# Patient Record
Sex: Male | Born: 1965
Health system: Southern US, Community
[De-identification: ages and names within clinical notes are randomized; demographics above are authoritative.]

## PROBLEM LIST (undated history)

## (undated) DIAGNOSIS — I2699 Other pulmonary embolism without acute cor pulmonale: Secondary | ICD-10-CM

## (undated) DIAGNOSIS — E119 Type 2 diabetes mellitus without complications: Secondary | ICD-10-CM

## (undated) DIAGNOSIS — I10 Essential (primary) hypertension: Secondary | ICD-10-CM

## (undated) DIAGNOSIS — M109 Gout, unspecified: Secondary | ICD-10-CM

## (undated) DIAGNOSIS — I480 Paroxysmal atrial fibrillation: Secondary | ICD-10-CM

## (undated) DIAGNOSIS — C9000 Multiple myeloma not having achieved remission: Secondary | ICD-10-CM

## (undated) DIAGNOSIS — D649 Anemia, unspecified: Secondary | ICD-10-CM

## (undated) DIAGNOSIS — E875 Hyperkalemia: Secondary | ICD-10-CM

## (undated) DIAGNOSIS — E1165 Type 2 diabetes mellitus with hyperglycemia: Secondary | ICD-10-CM

## (undated) DIAGNOSIS — I639 Cerebral infarction, unspecified: Secondary | ICD-10-CM

## (undated) DIAGNOSIS — R0989 Other specified symptoms and signs involving the circulatory and respiratory systems: Secondary | ICD-10-CM

## (undated) DIAGNOSIS — S065X9A Traumatic subdural hemorrhage with loss of consciousness of unspecified duration, initial encounter: Secondary | ICD-10-CM

## (undated) DIAGNOSIS — J45909 Unspecified asthma, uncomplicated: Secondary | ICD-10-CM

## (undated) DIAGNOSIS — H35039 Hypertensive retinopathy, unspecified eye: Secondary | ICD-10-CM

## (undated) DIAGNOSIS — G40909 Epilepsy, unspecified, not intractable, without status epilepticus: Secondary | ICD-10-CM

## (undated) DIAGNOSIS — K922 Gastrointestinal hemorrhage, unspecified: Secondary | ICD-10-CM

## (undated) DIAGNOSIS — K559 Vascular disorder of intestine, unspecified: Secondary | ICD-10-CM

## (undated) DIAGNOSIS — R7881 Bacteremia: Secondary | ICD-10-CM

## (undated) DIAGNOSIS — E11319 Type 2 diabetes mellitus with unspecified diabetic retinopathy without macular edema: Secondary | ICD-10-CM

## (undated) DIAGNOSIS — A419 Sepsis, unspecified organism: Secondary | ICD-10-CM

## (undated) DIAGNOSIS — R569 Unspecified convulsions: Secondary | ICD-10-CM

## (undated) DIAGNOSIS — D5740 Sickle-cell thalassemia without crisis: Secondary | ICD-10-CM

## (undated) DIAGNOSIS — G4733 Obstructive sleep apnea (adult) (pediatric): Secondary | ICD-10-CM

## (undated) DIAGNOSIS — D472 Monoclonal gammopathy: Secondary | ICD-10-CM

## (undated) DIAGNOSIS — N179 Acute kidney failure, unspecified: Secondary | ICD-10-CM

## (undated) DIAGNOSIS — Z9289 Personal history of other medical treatment: Secondary | ICD-10-CM

## (undated) DIAGNOSIS — Z992 Dependence on renal dialysis: Secondary | ICD-10-CM

## (undated) DIAGNOSIS — G4730 Sleep apnea, unspecified: Secondary | ICD-10-CM

## (undated) DIAGNOSIS — R7309 Other abnormal glucose: Secondary | ICD-10-CM

## (undated) DIAGNOSIS — Z9989 Dependence on other enabling machines and devices: Secondary | ICD-10-CM

## (undated) DIAGNOSIS — I4891 Unspecified atrial fibrillation: Secondary | ICD-10-CM

## (undated) DIAGNOSIS — N186 End stage renal disease: Secondary | ICD-10-CM

## (undated) HISTORY — PX: OTHER SURGICAL HISTORY: SHX169

## (undated) HISTORY — DX: Type 2 diabetes mellitus with unspecified diabetic retinopathy without macular edema: E11.319

## (undated) HISTORY — DX: Cerebral infarction, unspecified: I63.9

## (undated) HISTORY — DX: Sleep apnea, unspecified: G47.30

## (undated) HISTORY — DX: Unspecified convulsions: R56.9

## (undated) HISTORY — DX: Paroxysmal atrial fibrillation: I48.0

## (undated) HISTORY — DX: Acute kidney failure, unspecified: N17.9

## (undated) HISTORY — PX: CHOLECYSTECTOMY: SHX55

## (undated) HISTORY — PX: EYE SURGERY: SHX253

## (undated) HISTORY — DX: Other specified symptoms and signs involving the circulatory and respiratory systems: R09.89

## (undated) HISTORY — DX: Vascular disorder of intestine, unspecified: K55.9

## (undated) HISTORY — PX: BONE MARROW BIOPSY: SHX199

## (undated) HISTORY — DX: Type 2 diabetes mellitus with hyperglycemia: E11.65

## (undated) HISTORY — DX: Hypertensive retinopathy, unspecified eye: H35.039

## (undated) HISTORY — DX: Type 2 diabetes mellitus without complications: E11.9

## (undated) HISTORY — DX: Other abnormal glucose: R73.09

## (undated) HISTORY — DX: Bacteremia: R78.81

## (undated) HISTORY — DX: Monoclonal gammopathy: D47.2

## (undated) HISTORY — DX: Hyperkalemia: E87.5

---
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
## 2009-10-25 ENCOUNTER — Encounter: Admission: RE | Admit: 2009-10-25 | Discharge: 2009-10-25 | Payer: Self-pay | Admitting: Family Medicine

## 2010-02-11 ENCOUNTER — Encounter: Admission: RE | Admit: 2010-02-11 | Discharge: 2010-02-26 | Payer: Self-pay | Admitting: Family Medicine

## 2010-09-29 ENCOUNTER — Encounter: Payer: Self-pay | Admitting: Family Medicine

## 2010-09-30 ENCOUNTER — Encounter: Payer: Self-pay | Admitting: Cardiovascular Disease

## 2010-11-29 ENCOUNTER — Other Ambulatory Visit: Payer: Self-pay | Admitting: Oncology

## 2010-11-29 ENCOUNTER — Ambulatory Visit (HOSPITAL_COMMUNITY)
Admission: RE | Admit: 2010-11-29 | Discharge: 2010-11-29 | Disposition: A | Discharge status: Home / Self Care | Payer: 59 | Source: Ambulatory Visit | Attending: Oncology | Admitting: Oncology

## 2010-11-29 ENCOUNTER — Encounter (HOSPITAL_BASED_OUTPATIENT_CLINIC_OR_DEPARTMENT_OTHER): Payer: Commercial Managed Care - PPO | Admitting: Oncology

## 2010-11-29 DIAGNOSIS — C9000 Multiple myeloma not having achieved remission: Secondary | ICD-10-CM | POA: Insufficient documentation

## 2010-11-29 DIAGNOSIS — D472 Monoclonal gammopathy: Secondary | ICD-10-CM

## 2010-11-29 LAB — CBC WITH DIFFERENTIAL/PLATELET
BASO%: 0.6 % (ref 0.0–2.0)
Basophils Absolute: 0.1 10*3/uL (ref 0.0–0.1)
EOS%: 3.4 % (ref 0.0–7.0)
Eosinophils Absolute: 0.6 10*3/uL — ABNORMAL HIGH (ref 0.0–0.5)
HCT: 31.8 % — ABNORMAL LOW (ref 38.4–49.9)
HGB: 11.2 g/dL — ABNORMAL LOW (ref 13.0–17.1)
LYMPH%: 14.2 % (ref 14.0–49.0)
MCH: 25.5 pg — ABNORMAL LOW (ref 27.2–33.4)
MCHC: 35.2 g/dL (ref 32.0–36.0)
MCV: 72.3 fL — ABNORMAL LOW (ref 79.3–98.0)
MONO#: 1.8 10*3/uL — ABNORMAL HIGH (ref 0.1–0.9)
MONO%: 10.2 % (ref 0.0–14.0)
NEUT#: 12.6 10*3/uL — ABNORMAL HIGH (ref 1.5–6.5)
NEUT%: 71.6 % (ref 39.0–75.0)
Platelets: 383 10*3/uL (ref 140–400)
RBC: 4.4 10*6/uL (ref 4.20–5.82)
RDW: 17.8 % — ABNORMAL HIGH (ref 11.0–14.6)
WBC: 17.5 10*3/uL — ABNORMAL HIGH (ref 4.0–10.3)
lymph#: 2.5 10*3/uL (ref 0.9–3.3)
nRBC: 2 % — ABNORMAL HIGH (ref 0–0)

## 2010-11-29 LAB — CHCC SMEAR

## 2010-12-03 LAB — SPEP & IFE WITH QIG
Albumin ELP: 49.4 % — ABNORMAL LOW (ref 55.8–66.1)
Alpha-1-Globulin: 4.9 % (ref 2.9–4.9)
Alpha-2-Globulin: 8.4 % (ref 7.1–11.8)
Beta 2: 4.7 % (ref 3.2–6.5)
Beta Globulin: 5.4 % (ref 4.7–7.2)
Gamma Globulin: 27.2 % — ABNORMAL HIGH (ref 11.1–18.8)
IgA: 156 mg/dL (ref 68–378)
IgG (Immunoglobin G), Serum: 2780 mg/dL — ABNORMAL HIGH (ref 694–1618)
IgM, Serum: 51 mg/dL — ABNORMAL LOW (ref 60–263)
M-Spike, %: 1.15 g/dL
Total Protein, Serum Electrophoresis: 8.6 g/dL — ABNORMAL HIGH (ref 6.0–8.3)

## 2010-12-03 LAB — BETA 2 MICROGLOBULIN, SERUM: Beta-2 Microglobulin: 4.09 mg/L — ABNORMAL HIGH (ref 1.01–1.73)

## 2010-12-03 LAB — KAPPA/LAMBDA LIGHT CHAINS
Kappa free light chain: 4.25 mg/dL — ABNORMAL HIGH (ref 0.33–1.94)
Kappa:Lambda Ratio: 1.52 (ref 0.26–1.65)
Lambda Free Lght Chn: 2.8 mg/dL — ABNORMAL HIGH (ref 0.57–2.63)

## 2010-12-03 LAB — COMPREHENSIVE METABOLIC PANEL
ALT: 12 U/L (ref 0–53)
AST: 12 U/L (ref 0–37)
Albumin: 4.3 g/dL (ref 3.5–5.2)
Alkaline Phosphatase: 133 U/L — ABNORMAL HIGH (ref 39–117)
BUN: 28 mg/dL — ABNORMAL HIGH (ref 6–23)
CO2: 18 mEq/L — ABNORMAL LOW (ref 19–32)
Calcium: 9.4 mg/dL (ref 8.4–10.5)
Chloride: 111 mEq/L (ref 96–112)
Creatinine, Ser: 1.76 mg/dL — ABNORMAL HIGH (ref 0.40–1.50)
Glucose, Bld: 212 mg/dL — ABNORMAL HIGH (ref 70–99)
Potassium: 5.1 mEq/L (ref 3.5–5.3)
Sodium: 139 mEq/L (ref 135–145)
Total Bilirubin: 1.1 mg/dL (ref 0.3–1.2)
Total Protein: 8.6 g/dL — ABNORMAL HIGH (ref 6.0–8.3)

## 2010-12-06 ENCOUNTER — Other Ambulatory Visit: Payer: Self-pay | Admitting: Oncology

## 2010-12-10 LAB — UIFE/LIGHT CHAINS/TP QN, 24-HR UR
Albumin, U: DETECTED
Alpha 1, Urine: DETECTED — AB
Alpha 2, Urine: DETECTED — AB
Beta, Urine: DETECTED — AB
Free Kappa Lt Chains,Ur: 16.2 mg/dL — ABNORMAL HIGH (ref 0.04–1.51)
Free Kappa/Lambda Ratio: 11.25 ratio — ABNORMAL HIGH (ref 0.46–4.00)
Free Lambda Excretion/Day: 38.88 mg/d
Free Lambda Lt Chains,Ur: 1.44 mg/dL — ABNORMAL HIGH (ref 0.08–1.01)
Free Lt Chn Excr Rate: 437.4 mg/d
Gamma Globulin, Urine: DETECTED — AB
Time: 24 hours
Total Protein, Urine-Ur/day: 1018 mg/d — ABNORMAL HIGH (ref 10–140)
Total Protein, Urine: 37.7 mg/dL
Volume, Urine: 2700 mL

## 2010-12-13 DIAGNOSIS — D631 Anemia in chronic kidney disease: Secondary | ICD-10-CM

## 2010-12-13 DIAGNOSIS — N039 Chronic nephritic syndrome with unspecified morphologic changes: Secondary | ICD-10-CM

## 2010-12-13 DIAGNOSIS — I120 Hypertensive chronic kidney disease with stage 5 chronic kidney disease or end stage renal disease: Secondary | ICD-10-CM

## 2010-12-13 DIAGNOSIS — N189 Chronic kidney disease, unspecified: Secondary | ICD-10-CM

## 2010-12-16 ENCOUNTER — Other Ambulatory Visit: Payer: Self-pay | Admitting: Oncology

## 2010-12-16 DIAGNOSIS — C9000 Multiple myeloma not having achieved remission: Secondary | ICD-10-CM

## 2010-12-27 ENCOUNTER — Ambulatory Visit (HOSPITAL_COMMUNITY): Payer: Commercial Managed Care - PPO

## 2010-12-27 ENCOUNTER — Other Ambulatory Visit: Payer: Self-pay | Admitting: Oncology

## 2010-12-27 ENCOUNTER — Other Ambulatory Visit: Payer: Self-pay | Admitting: Diagnostic Radiology

## 2010-12-27 ENCOUNTER — Ambulatory Visit (HOSPITAL_COMMUNITY)
Admission: RE | Admit: 2010-12-27 | Discharge: 2010-12-27 | Disposition: A | Discharge status: Home / Self Care | Payer: Commercial Managed Care - PPO | Source: Ambulatory Visit | Attending: Oncology | Admitting: Oncology

## 2010-12-27 DIAGNOSIS — C9000 Multiple myeloma not having achieved remission: Secondary | ICD-10-CM | POA: Insufficient documentation

## 2010-12-27 LAB — CBC
HCT: 32 % — ABNORMAL LOW (ref 39.0–52.0)
Hemoglobin: 11.3 g/dL — ABNORMAL LOW (ref 13.0–17.0)
MCH: 25.6 pg — ABNORMAL LOW (ref 26.0–34.0)
MCHC: 35.3 g/dL (ref 30.0–36.0)
MCV: 72.4 fL — ABNORMAL LOW (ref 78.0–100.0)
Platelets: 395 K/uL (ref 150–400)
RBC: 4.42 MIL/uL (ref 4.22–5.81)
RDW: 17.5 % — ABNORMAL HIGH (ref 11.5–15.5)
WBC: 16.2 K/uL — ABNORMAL HIGH (ref 4.0–10.5)

## 2010-12-27 LAB — GLUCOSE, CAPILLARY
Glucose-Capillary: 160 mg/dL — ABNORMAL HIGH (ref 70–99)
Glucose-Capillary: 159 mg/dL — ABNORMAL HIGH (ref 70–99)

## 2010-12-27 LAB — PROTIME-INR
INR: 1.06 (ref 0.00–1.49)
Prothrombin Time: 14 seconds (ref 11.6–15.2)

## 2010-12-27 LAB — APTT: aPTT: 34 s (ref 24–37)

## 2011-07-04 ENCOUNTER — Other Ambulatory Visit: Payer: Self-pay | Admitting: Oncology

## 2011-07-04 ENCOUNTER — Encounter (HOSPITAL_BASED_OUTPATIENT_CLINIC_OR_DEPARTMENT_OTHER): Payer: 59 | Admitting: Oncology

## 2011-07-04 DIAGNOSIS — D472 Monoclonal gammopathy: Secondary | ICD-10-CM

## 2011-07-04 DIAGNOSIS — I120 Hypertensive chronic kidney disease with stage 5 chronic kidney disease or end stage renal disease: Secondary | ICD-10-CM

## 2011-07-04 DIAGNOSIS — N189 Chronic kidney disease, unspecified: Secondary | ICD-10-CM

## 2011-07-04 DIAGNOSIS — D631 Anemia in chronic kidney disease: Secondary | ICD-10-CM

## 2011-07-04 LAB — CBC WITH DIFFERENTIAL/PLATELET
BASO%: 1 % (ref 0.0–2.0)
Basophils Absolute: 0.1 10*3/uL (ref 0.0–0.1)
EOS%: 3.4 % (ref 0.0–7.0)
Eosinophils Absolute: 0.5 10*3/uL (ref 0.0–0.5)
HCT: 30.9 % — ABNORMAL LOW (ref 38.4–49.9)
HGB: 10.9 g/dL — ABNORMAL LOW (ref 13.0–17.1)
LYMPH%: 20 % (ref 14.0–49.0)
MCH: 25.5 pg — ABNORMAL LOW (ref 27.2–33.4)
MCHC: 35.3 g/dL (ref 32.0–36.0)
MCV: 72.2 fL — ABNORMAL LOW (ref 79.3–98.0)
MONO#: 1.6 10*3/uL — ABNORMAL HIGH (ref 0.1–0.9)
MONO%: 10.6 % (ref 0.0–14.0)
NEUT#: 9.6 10*3/uL — ABNORMAL HIGH (ref 1.5–6.5)
NEUT%: 65 % (ref 39.0–75.0)
Platelets: 374 10*3/uL (ref 140–400)
RBC: 4.28 10*6/uL (ref 4.20–5.82)
RDW: 17.5 % — ABNORMAL HIGH (ref 11.0–14.6)
WBC: 14.7 10*3/uL — ABNORMAL HIGH (ref 4.0–10.3)
lymph#: 2.9 10*3/uL (ref 0.9–3.3)
nRBC: 3 % — ABNORMAL HIGH (ref 0–0)

## 2011-07-10 DIAGNOSIS — I2699 Other pulmonary embolism without acute cor pulmonale: Secondary | ICD-10-CM

## 2011-07-10 DIAGNOSIS — E875 Hyperkalemia: Secondary | ICD-10-CM

## 2011-07-10 HISTORY — DX: Hyperkalemia: E87.5

## 2011-07-10 HISTORY — DX: Other pulmonary embolism without acute cor pulmonale: I26.99

## 2011-07-11 ENCOUNTER — Telehealth: Payer: Self-pay | Admitting: Oncology

## 2011-07-11 ENCOUNTER — Ambulatory Visit (HOSPITAL_BASED_OUTPATIENT_CLINIC_OR_DEPARTMENT_OTHER): Payer: 59 | Admitting: Oncology

## 2011-07-11 VITALS — BP 136/87 | HR 81 | Temp 98.6°F | Ht 69.0 in | Wt 293.2 lb

## 2011-07-11 DIAGNOSIS — D7289 Other specified disorders of white blood cells: Secondary | ICD-10-CM

## 2011-07-11 DIAGNOSIS — D631 Anemia in chronic kidney disease: Secondary | ICD-10-CM

## 2011-07-11 DIAGNOSIS — D729 Disorder of white blood cells, unspecified: Secondary | ICD-10-CM

## 2011-07-11 DIAGNOSIS — D472 Monoclonal gammopathy: Secondary | ICD-10-CM

## 2011-07-11 DIAGNOSIS — I120 Hypertensive chronic kidney disease with stage 5 chronic kidney disease or end stage renal disease: Secondary | ICD-10-CM

## 2011-07-11 DIAGNOSIS — N189 Chronic kidney disease, unspecified: Secondary | ICD-10-CM

## 2011-07-11 DIAGNOSIS — N185 Chronic kidney disease, stage 5: Secondary | ICD-10-CM

## 2011-07-11 LAB — HGB ELECTROPHORESIS REFLEXED REPORT
Hemoglobin A - HGBRFX: 0 % — ABNORMAL LOW (ref >96.0)
Hemoglobin A2 - HGBRFX: 3.9 % — ABNORMAL HIGH (ref 1.8–3.5)
Hemoglobin Elect C: 43.8 % — ABNORMAL HIGH
Hemoglobin F - HGBRFX: 1.1 % (ref <2.0)
Hemoglobin S - HGBRFX: 51.2 % — ABNORMAL HIGH
Sickle Solubility Test - HGBRFX: POSITIVE — AB

## 2011-07-11 LAB — COMPREHENSIVE METABOLIC PANEL
ALT: 11 U/L (ref 0–53)
AST: 13 U/L (ref 0–37)
Albumin: 4.2 g/dL (ref 3.5–5.2)
Alkaline Phosphatase: 115 U/L (ref 39–117)
BUN: 34 mg/dL — ABNORMAL HIGH (ref 6–23)
CO2: 14 mEq/L — ABNORMAL LOW (ref 19–32)
Calcium: 8.8 mg/dL (ref 8.4–10.5)
Chloride: 112 mEq/L (ref 96–112)
Creatinine, Ser: 1.92 mg/dL — ABNORMAL HIGH (ref 0.50–1.35)
Glucose, Bld: 148 mg/dL — ABNORMAL HIGH (ref 70–99)
Potassium: 5.7 mEq/L — ABNORMAL HIGH (ref 3.5–5.3)
Sodium: 137 mEq/L (ref 135–145)
Total Bilirubin: 0.9 mg/dL (ref 0.3–1.2)
Total Protein: 8.2 g/dL (ref 6.0–8.3)

## 2011-07-11 LAB — SPEP & IFE WITH QIG
Albumin ELP: 49.2 % — ABNORMAL LOW (ref 55.8–66.1)
Alpha-1-Globulin: 4.3 % (ref 2.9–4.9)
Alpha-2-Globulin: 8.1 % (ref 7.1–11.8)
Beta 2: 4.6 % (ref 3.2–6.5)
Beta Globulin: 5 % (ref 4.7–7.2)
Gamma Globulin: 28.8 % — ABNORMAL HIGH (ref 11.1–18.8)
IgA: 152 mg/dL (ref 68–379)
IgG (Immunoglobin G), Serum: 2760 mg/dL — ABNORMAL HIGH (ref 650–1600)
IgM, Serum: 44 mg/dL (ref 41–251)
M-Spike, %: 1.22 g/dL
Total Protein, Serum Electrophoresis: 8.2 g/dL (ref 6.0–8.3)

## 2011-07-11 LAB — HEMOGLOBINOPATHY EVALUATION
Hemoglobin Other: 44.3 % — ABNORMAL HIGH (ref 0.0–0.0)
Hgb A2 Quant: 3.1 % (ref 2.2–3.2)
Hgb A: 0 % — ABNORMAL LOW (ref 96.8–97.8)
Hgb F Quant: 1.7 % (ref 0.0–2.0)
Hgb S Quant: 50.9 % — ABNORMAL HIGH (ref 0.0–0.0)

## 2011-07-11 LAB — KAPPA/LAMBDA LIGHT CHAINS
Kappa free light chain: 7.09 mg/dL — ABNORMAL HIGH (ref 0.33–1.94)
Kappa:Lambda Ratio: 1.93 — ABNORMAL HIGH (ref 0.26–1.65)
Lambda Free Lght Chn: 3.67 mg/dL — ABNORMAL HIGH (ref 0.57–2.63)

## 2011-07-11 NOTE — Telephone Encounter (Signed)
gv pt appt schedule for may.

## 2011-07-14 NOTE — Progress Notes (Signed)
CC:   Louis Meckel, M.D. Lynne Logan, MD  PRINCIPAL DIAGNOSES:  This is a 45 year old gentleman with the following diagnoses: 1. Monoclonal protein in the form of IgG kappa, likely represents     monoclonal gammopathy, undetermined significance without enough     criteria to suggest multiple myeloma. 2. Microcytic anemia as a part of a hemoglobinopathy, most likely     thalassemia.  HISTORY OF PRESENT ILLNESS:  Mr. Elting presents today for a followup visit.  He is a gentleman whom I saw initially back in March 2012 for evaluation of IgG kappa monoclonal protein.  At that time, his M-spike was around 1.1 g/dL.  He had an IgG level of 2,780.  He did not have any lytic bony lesions.  He did not have severe plasmacytosis.  He had a slight increase in his plasma cells about 7%, but the pattern did not indicate a monotonous infiltration of plasma cells more than the intermittent scattered clusters, again, which suggest against multiple myeloma at least for the time being.  Clinically, Mr. Beagle is asymptomatic.  He does have renal insufficiency, which could be related to his longstanding hypertension.  It could be also related to his plasma cell disorder.  Since his recent diagnosis back in March, we have elected to put him on surveillance and repeat his serum protein electrophoresis, protein studies for which he is here to discuss them today.  Clinically, Mr. Stille is asymptomatic.  He did not report any chest pain.  He did not report any back pain.  He did not report any opportunistic infection.  He has not had any hospitalization or illnesses.  Overall, performance status activity level remains unchanged at this time.  He did not report any chest pain or difficulty breathing. As mentioned, he had no complications, fatigue, or tiredness.  REVIEW OF SYSTEMS:  He does not report any headaches, blurry vision, or double vision.  He does not report any motor or sensory  neuropathy.  He does not report any alteration in mental status.  He does not report any psychiatric issues or depression.  He does not report any fever, chills, or sweats.  He does not report any cough, hemoptysis, or hematemesis. No nausea or vomiting.  No abdominal pain.  No hematochezia or melena. No genitourinary complaints.  The rest of the review of systems is unremarkable.  PHYSICAL EXAMINATION:  General Appearance:  An alert and awake gentleman who appeared in no active distress.  Vital Signs:  His blood pressure is 136/80.  Pulse is 81.  Respirations 20.  Weight is 293.2 pounds.  HEENT: Head is normocephalic, atraumatic.  Pupils are equal, round, and reactive to light.  Oral mucosa is moist and pink.  Neck:  Supple.  No lymphadenopathy.  Heart:  Regular rate and rhythm.  S1 and S2.  Lungs: Clear to auscultation.  No wheezes or dullness to percussion.  Abdomen: Soft, nontender.  No hepatosplenomegaly.  Extremities:  No clubbing, cyanosis, or edema.  Neurologic:  Intact motor, sensory, and deep tendon reflexes.  LABORATORY DATA TODAY:  Hemoglobin of 10.9, white cell count of 14.7, platelet count 374.  Chemistry showed a creatinine of 1.92.  Normal liver function tests.  His M-spike is about 1.2 g/dL and is really unchanged.  The IgG level is 2,760, unchanged.  His kappa and lambda free light chains are both elevated with a kappa-to-lambda ratio slightly high at 1.9.  His hemoglobin electrophoresis is still pending, but is indicating a hemoglobin S  of about 50% and other hemoglobin about 44%.  Again, indicating a hemoglobinopathy.  ASSESSMENT AND PLAN:  This is a 45 year old gentleman with the following issues: 1. Monoclonal gammopathy, IgG subtype.  Again, represents likely     monoclonal gammopathy of undetermined significance.  Again, really     no clear-cut evidence to suggest end-organ damage.  No lytic bony     lesions.  His bone marrow biopsy did not suggest the  diagnosis of     multiple myeloma, again, due to the really scattered nature of his     plasma cells.  However, he is at a high risk of developing multiple     myeloma and it is very important to keep close observation.  I will     remeasure his protein studies in 6 months. 2. Anemia, which is multifactorial and definitely has an element of     hemoglobinopathy.  Again, the etiology is probably a combination of     sickle cell with thalassemia combination at this point.  Typically,     his hemoglobin is relatively under reasonable control.  It is not     severely low, so really no intervention is needed at this time.    ______________________________ Wyatt Portela, M.D. FNS/MEDQ  D:  07/11/2011  T:  07/14/2011  Job:  [id number]

## 2011-07-18 ENCOUNTER — Other Ambulatory Visit: Payer: Self-pay

## 2011-07-18 ENCOUNTER — Emergency Department (HOSPITAL_COMMUNITY): Payer: 59

## 2011-07-18 ENCOUNTER — Encounter: Payer: Self-pay | Admitting: Emergency Medicine

## 2011-07-18 ENCOUNTER — Inpatient Hospital Stay (HOSPITAL_COMMUNITY)
Admission: EM | Admit: 2011-07-18 | Discharge: 2011-07-22 | DRG: 176 | Disposition: A | Discharge status: Home / Self Care | Payer: 59 | Attending: Internal Medicine | Admitting: Internal Medicine

## 2011-07-18 DIAGNOSIS — E1129 Type 2 diabetes mellitus with other diabetic kidney complication: Secondary | ICD-10-CM | POA: Diagnosis present

## 2011-07-18 DIAGNOSIS — E872 Acidosis, unspecified: Secondary | ICD-10-CM | POA: Diagnosis present

## 2011-07-18 DIAGNOSIS — R0602 Shortness of breath: Secondary | ICD-10-CM

## 2011-07-18 DIAGNOSIS — N179 Acute kidney failure, unspecified: Secondary | ICD-10-CM | POA: Diagnosis present

## 2011-07-18 DIAGNOSIS — I10 Essential (primary) hypertension: Secondary | ICD-10-CM | POA: Diagnosis present

## 2011-07-18 DIAGNOSIS — E875 Hyperkalemia: Secondary | ICD-10-CM | POA: Diagnosis present

## 2011-07-18 DIAGNOSIS — I2699 Other pulmonary embolism without acute cor pulmonale: Principal | ICD-10-CM | POA: Diagnosis present

## 2011-07-18 DIAGNOSIS — I498 Other specified cardiac arrhythmias: Secondary | ICD-10-CM | POA: Diagnosis present

## 2011-07-18 DIAGNOSIS — D573 Sickle-cell trait: Secondary | ICD-10-CM | POA: Diagnosis present

## 2011-07-18 DIAGNOSIS — Z794 Long term (current) use of insulin: Secondary | ICD-10-CM

## 2011-07-18 DIAGNOSIS — Z7982 Long term (current) use of aspirin: Secondary | ICD-10-CM

## 2011-07-18 HISTORY — DX: Essential (primary) hypertension: I10

## 2011-07-18 HISTORY — DX: Sickle-cell thalassemia without crisis: D57.40

## 2011-07-18 LAB — CBC
HCT: 29.3 % — ABNORMAL LOW (ref 39.0–52.0)
Hemoglobin: 10 g/dL — ABNORMAL LOW (ref 13.0–17.0)
MCH: 25.3 pg — ABNORMAL LOW (ref 26.0–34.0)
MCHC: 34.1 g/dL (ref 30.0–36.0)
MCV: 74 fL — ABNORMAL LOW (ref 78.0–100.0)
Platelets: 303 10*3/uL (ref 150–400)
RBC: 3.96 MIL/uL — ABNORMAL LOW (ref 4.22–5.81)
RDW: 17.4 % — ABNORMAL HIGH (ref 11.5–15.5)
WBC: 18.4 10*3/uL — ABNORMAL HIGH (ref 4.0–10.5)

## 2011-07-18 LAB — BASIC METABOLIC PANEL
BUN: 33 mg/dL — ABNORMAL HIGH (ref 6–23)
BUN: 41 mg/dL — ABNORMAL HIGH (ref 6–23)
CO2: 17 mEq/L — ABNORMAL LOW (ref 19–32)
CO2: 19 mEq/L (ref 19–32)
Calcium: 8.9 mg/dL (ref 8.4–10.5)
Calcium: 8.7 mg/dL (ref 8.4–10.5)
Chloride: 106 mEq/L (ref 96–112)
Chloride: 107 mEq/L (ref 96–112)
Creatinine, Ser: 2.31 mg/dL — ABNORMAL HIGH (ref 0.50–1.35)
Creatinine, Ser: 2.49 mg/dL — ABNORMAL HIGH (ref 0.50–1.35)
GFR calc Af Amer: 37 mL/min — ABNORMAL LOW (ref >90)
GFR calc Af Amer: 34 mL/min — ABNORMAL LOW (ref >90)
GFR calc non Af Amer: 32 mL/min — ABNORMAL LOW (ref >90)
GFR calc non Af Amer: 29 mL/min — ABNORMAL LOW (ref >90)
Glucose, Bld: 363 mg/dL — ABNORMAL HIGH (ref 70–99)
Glucose, Bld: 231 mg/dL — ABNORMAL HIGH (ref 70–99)
Potassium: 5.5 mEq/L — ABNORMAL HIGH (ref 3.5–5.1)
Potassium: 5.9 mEq/L — ABNORMAL HIGH (ref 3.5–5.1)
Sodium: 136 mEq/L (ref 135–145)
Sodium: 136 mEq/L (ref 135–145)

## 2011-07-18 LAB — TROPONIN I
Troponin I: 1.79 ng/mL (ref <0.30)
Troponin I: >25 ng/mL (ref <0.30)

## 2011-07-18 LAB — CARDIAC PANEL(CRET KIN+CKTOT+MB+TROPI)
CK, MB: 62.2 ng/mL (ref 0.3–4.0)
Relative Index: 5.8 — ABNORMAL HIGH (ref 0.0–2.5)
Total CK: 1081 U/L — ABNORMAL HIGH (ref 7–232)
Troponin I: >25 ng/mL (ref <0.30)

## 2011-07-18 LAB — HEMOGLOBIN A1C
Hgb A1c MFr Bld: 6.1 % — ABNORMAL HIGH (ref <5.7)
Mean Plasma Glucose: 128 mg/dL — ABNORMAL HIGH (ref <117)

## 2011-07-18 LAB — PROTIME-INR
INR: 1.12 (ref 0.00–1.49)
Prothrombin Time: 14.6 seconds (ref 11.6–15.2)

## 2011-07-18 LAB — TSH: TSH: 1.425 u[IU]/mL (ref 0.350–4.500)

## 2011-07-18 LAB — GLUCOSE, CAPILLARY
Glucose-Capillary: 292 mg/dL — ABNORMAL HIGH (ref 70–99)
Glucose-Capillary: 198 mg/dL — ABNORMAL HIGH (ref 70–99)
Glucose-Capillary: 211 mg/dL — ABNORMAL HIGH (ref 70–99)

## 2011-07-18 LAB — LACTIC ACID, PLASMA: Lactic Acid, Venous: 3.3 mmol/L — ABNORMAL HIGH (ref 0.5–2.2)

## 2011-07-18 LAB — HEPARIN LEVEL (UNFRACTIONATED): Heparin Unfractionated: 0.15 IU/mL — ABNORMAL LOW (ref 0.30–0.70)

## 2011-07-18 LAB — MRSA PCR SCREENING: MRSA by PCR: NEGATIVE

## 2011-07-18 MED ORDER — XENON XE 133 GAS
10.0000 | GAS_FOR_INHALATION | Freq: Once | RESPIRATORY_TRACT | Status: AC | PRN
  Administered 2011-07-18: 10 via RESPIRATORY_TRACT

## 2011-07-18 MED ORDER — COUMADIN BOOK
Freq: Once | Status: AC
  Administered 2011-07-18: 22:00:00
  Filled 2011-07-18: qty 1

## 2011-07-18 MED ORDER — INSULIN ASPART 100 UNIT/ML ~~LOC~~ SOLN
10.0000 [IU] | Freq: Once | SUBCUTANEOUS | Status: AC
  Administered 2011-07-18: 10 [IU] via SUBCUTANEOUS
  Filled 2011-07-18: qty 3

## 2011-07-18 MED ORDER — HEPARIN (PORCINE) IN NACL 100-0.45 UNIT/ML-% IJ SOLN
1250.0000 [IU]/h | Freq: Once | INTRAMUSCULAR | Status: AC
  Administered 2011-07-18: 1250 [IU]/h via INTRAVENOUS
  Filled 2011-07-18: qty 250

## 2011-07-18 MED ORDER — WARFARIN VIDEO
Freq: Once | Status: AC
  Administered 2011-07-21: 12:00:00
  Filled 2011-07-18: qty 1

## 2011-07-18 MED ORDER — ASPIRIN 81 MG PO TABS
324.0000 mg | ORAL_TABLET | Freq: Once | ORAL | Status: DC
  Administered 2011-07-18: 324 mg via ORAL

## 2011-07-18 MED ORDER — INSULIN ASPART 100 UNIT/ML ~~LOC~~ SOLN: 10.0000 [IU] | Freq: Once | SUBCUTANEOUS | Status: DC

## 2011-07-18 MED ORDER — ONDANSETRON HCL 4 MG PO TABS: 4.0000 mg | ORAL_TABLET | Freq: Four times a day (QID) | ORAL | Status: DC | PRN

## 2011-07-18 MED ORDER — HYDRALAZINE HCL 20 MG/ML IJ SOLN
10.0000 mg | Freq: Three times a day (TID) | INTRAMUSCULAR | Status: DC | PRN
  Filled 2011-07-18: qty 0.5

## 2011-07-18 MED ORDER — HEPARIN (PORCINE) IN NACL 100-0.45 UNIT/ML-% IJ SOLN: 2300.0000 [IU]/h | Freq: Once | INTRAMUSCULAR | Status: DC

## 2011-07-18 MED ORDER — ASPIRIN 81 MG PO CHEW: 324.0000 mg | CHEWABLE_TABLET | Freq: Once | ORAL | Status: DC

## 2011-07-18 MED ORDER — MORPHINE SULFATE 2 MG/ML IJ SOLN: 2.0000 mg | INTRAMUSCULAR | Status: DC | PRN

## 2011-07-18 MED ORDER — ALUM & MAG HYDROXIDE-SIMETH 200-200-20 MG/5ML PO SUSP: 30.0000 mL | Freq: Four times a day (QID) | ORAL | Status: DC | PRN

## 2011-07-18 MED ORDER — ASPIRIN EC 81 MG PO TBEC
81.0000 mg | DELAYED_RELEASE_TABLET | Freq: Every day | ORAL | Status: DC
  Administered 2011-07-19 – 2011-07-22 (×4): 81 mg via ORAL
  Filled 2011-07-18 (×4): qty 1

## 2011-07-18 MED ORDER — HEPARIN SODIUM (PORCINE) 5000 UNIT/ML IJ SOLN
INTRAMUSCULAR | Status: AC
  Filled 2011-07-18: qty 2

## 2011-07-18 MED ORDER — ACETAMINOPHEN 325 MG PO TABS: 650.0000 mg | ORAL_TABLET | Freq: Four times a day (QID) | ORAL | Status: DC | PRN

## 2011-07-18 MED ORDER — HEPARIN SODIUM (PORCINE) 1000 UNIT/ML IJ SOLN: 10400.0000 [IU] | Freq: Once | INTRAMUSCULAR | Status: DC

## 2011-07-18 MED ORDER — HEPARIN BOLUS VIA INFUSION
4000.0000 [IU] | Freq: Once | INTRAVENOUS | Status: AC
  Administered 2011-07-18: 4000 [IU] via INTRAVENOUS
  Filled 2011-07-18: qty 4000

## 2011-07-18 MED ORDER — SODIUM CHLORIDE 0.9 % IV BOLUS (SEPSIS)
1000.0000 mL | Freq: Once | INTRAVENOUS | Status: AC
  Administered 2011-07-18: 1000 mL via INTRAVENOUS

## 2011-07-18 MED ORDER — INSULIN ASPART 100 UNIT/ML ~~LOC~~ SOLN: 3.0000 [IU] | Freq: Three times a day (TID) | SUBCUTANEOUS | Status: DC

## 2011-07-18 MED ORDER — HEPARIN (PORCINE) IN NACL 100-0.45 UNIT/ML-% IJ SOLN
1900.0000 [IU]/h | INTRAMUSCULAR | Status: DC
  Administered 2011-07-19: 1600 [IU]/h via INTRAVENOUS
  Administered 2011-07-20 – 2011-07-21 (×3): 1900 [IU]/h via INTRAVENOUS
  Filled 2011-07-18 (×7): qty 250

## 2011-07-18 MED ORDER — ONDANSETRON HCL 4 MG/2ML IJ SOLN: 4.0000 mg | Freq: Four times a day (QID) | INTRAMUSCULAR | Status: DC | PRN

## 2011-07-18 MED ORDER — ASPIRIN 81 MG PO TABS: 81.0000 mg | ORAL_TABLET | Freq: Every day | ORAL | Status: DC

## 2011-07-18 MED ORDER — ACETAMINOPHEN 650 MG RE SUPP: 650.0000 mg | Freq: Four times a day (QID) | RECTAL | Status: DC | PRN

## 2011-07-18 MED ORDER — INSULIN ASPART 100 UNIT/ML ~~LOC~~ SOLN: 0.0000 [IU] | Freq: Three times a day (TID) | SUBCUTANEOUS | Status: DC

## 2011-07-18 MED ORDER — HEPARIN (PORCINE) IN NACL 100-0.45 UNIT/ML-% IJ SOLN: 1600.0000 [IU]/h | INTRAMUSCULAR | Status: DC

## 2011-07-18 MED ORDER — WARFARIN SODIUM 10 MG PO TABS
10.0000 mg | ORAL_TABLET | Freq: Once | ORAL | Status: AC
  Administered 2011-07-19: 10 mg via ORAL
  Filled 2011-07-18: qty 1

## 2011-07-18 MED ORDER — INSULIN GLARGINE 100 UNIT/ML ~~LOC~~ SOLN
65.0000 [IU] | Freq: Every day | SUBCUTANEOUS | Status: DC
  Administered 2011-07-19 (×2): 65 [IU] via SUBCUTANEOUS
  Filled 2011-07-18 (×3): qty 3

## 2011-07-18 MED ORDER — TECHNETIUM TO 99M ALBUMIN AGGREGATED
6.0000 | Freq: Once | INTRAVENOUS | Status: AC | PRN
  Administered 2011-07-18: 6 via INTRAVENOUS

## 2011-07-18 MED ORDER — HEPARIN SODIUM (PORCINE) 5000 UNIT/ML IJ SOLN
INTRAMUSCULAR | Status: AC
  Filled 2011-07-18: qty 1

## 2011-07-18 MED ORDER — SODIUM CHLORIDE 0.9 % IV SOLN
INTRAVENOUS | Status: AC
  Administered 2011-07-19: 01:00:00 via INTRAVENOUS

## 2011-07-18 MED ORDER — HEPARIN BOLUS VIA INFUSION
3000.0000 [IU] | Freq: Once | INTRAVENOUS | Status: AC
  Administered 2011-07-18: 3000 [IU] via INTRAVENOUS
  Filled 2011-07-18: qty 3000

## 2011-07-18 NOTE — ED Notes (Signed)
Pharmacy at bedside to up heparin orders

## 2011-07-18 NOTE — ED Notes (Signed)
Patient wife left and will be returning shortly, wife left telephone number if sometime happens or someone need any information about pt. Number is [phone]

## 2011-07-18 NOTE — ED Notes (Signed)
Spoke with Service Response Maryjean Ka) and ordered carb modified tray per Nunzio Cory, RN

## 2011-07-18 NOTE — Progress Notes (Signed)
ANTICOAGULATION CONSULT NOTE - Initial Consult  Pharmacy Consult for Heparin Indication: chest pain/ACS  Allergies  Allergen Reactions  . Codeine Rash    Patient Measurements: Height: 5' 10"  (177.8 cm) Weight: 293 lb (132.904 kg) IBW/kg (Calculated) : 73  Adjusted Body Weight: Heparin wt = 103kg  Vital Signs: BP: 139/77 mmHg (11/09 0821) Pulse Rate: 117  (11/09 0821)  Labs: No results found for this basename: HGB:2,HCT:3,PLT:3,APTT:3,LABPROT:3,INR:3,HEPARINUNFRC:3,CREATININE:3,CKTOTAL:3,CKMB:3,TROPONINI:3 in the last 72 hours CrCl is unknown because no creatinine reading has been taken.  Medical History: Past Medical History  Diagnosis Date  . Diabetes mellitus   . Hypertension     Medications:   (Not in a hospital admission)  Assessment: Patient brought to ED for chest pain and ECG changes on route.  In ED decided to perform chest CT prior to sending patient to cath lab.  No CAD history.  History of DM and HTN Goal of Therapy:  Heparin level 0.3-0.7 units/ml   Plan:  4000 units bolus Heparin drip at 1250 uts/hr will check 6hr heparin level and daily heparin level and CBC  Nadine Counts 07/18/2011,8:55 AM

## 2011-07-18 NOTE — ED Provider Notes (Addendum)
History    45yM with acute onset dyspnea shortly before arrival. Called prehospital as possible STEMI. Initial EKG nondiagnostic though and pt without CP. Discussed with cath lab team that no indication for PCI on arrival. Dyspnea has been persistent since onset. No back pain. No n/v. No leg pain or swelling. No hx of blood clot. No fever or chills. No cough. Denies hx of anxiety.  CSN: [id number] Arrival date & time: 07/18/2011  8:15 AM   First MD Initiated Contact with Patient 07/18/11 0815      Chief Complaint  Patient presents with  . Chest Pain  . Shortness of Breath    (Consider location/radiation/quality/duration/timing/severity/associated sxs/prior treatment) HPI  Past Medical History  Diagnosis Date  . Diabetes mellitus   . Hypertension     Past Surgical History  Procedure Date  . Cholecystectomy     No family history on file.  History  Substance Use Topics  . Smoking status: Never Smoker   . Smokeless tobacco: Not on file  . Alcohol Use: No      Review of Systems   Review of symptoms negative unless otherwise noted in HPI.  Allergies  Codeine  Home Medications   Current Outpatient Rx  Name Route Sig Dispense Refill  . ASPIRIN 81 MG PO TABS Oral Take 81 mg by mouth daily.      . INSULIN GLARGINE 100 UNIT/ML [place] SOLN Subcutaneous Inject 65 Units into the skin daily.      . INSULIN LISPRO (HUMAN) 100 UNIT/ML [place] SOLN Subcutaneous Inject 10 Units into the skin 3 (three) times daily before meals.      [name] VALSARTAN-HYDROCHLOROTHIAZIDE 320-25 MG PO TABS Oral Take 1 tablet by mouth daily.        BP 139/77  Pulse 117  Resp 20  SpO2 100%  Physical Exam  Nursing note and vitals reviewed. Constitutional: He appears distressed.       Obese. Pt distressed on arrival. Anxious appearing and sitting upright.  HENT:  Head: Normocephalic and atraumatic.  Eyes: Conjunctivae are normal. Pupils are equal, round, and reactive to light. Right eye exhibits no  discharge. Left eye exhibits no discharge.  Neck: Neck supple.  Cardiovascular: Regular rhythm and normal heart sounds.  Exam reveals no gallop and no friction rub.   No murmur heard.      tachycardic  Pulmonary/Chest: Effort normal and breath sounds normal. No stridor. No respiratory distress.       tachypneic on NRB.  Abdominal: Soft. He exhibits no distension. There is no tenderness.  Musculoskeletal: He exhibits no edema and no tenderness.  Neurological: He is alert.  Skin: Skin is warm and dry. No rash noted. No erythema.  Psychiatric: Thought content normal.    ED Course  [place] bedside Performed by: Virgel Manifold Authorized by: Virgel Manifold Consent: Verbal consent obtained. Risks and benefits: risks, benefits and alternatives were discussed Consent given by: patient Patient identity confirmed: verbally with patient and arm band Comments: Bedside US used to identify vein with linear array probe  Angiocath insertion Date/Time: 07/18/2011 1:35 AM Performed by: Virgel Manifold Authorized by: Virgel Manifold Consent: Verbal consent obtained. The procedure was performed in an emergent situation. Risks and benefits: risks, benefits and alternatives were discussed Consent given by: patient Preparation: Patient was prepped and draped in the usual sterile fashion. Local anesthesia used: no Patient sedated: no Patient tolerance: Patient tolerated the procedure well with no immediate complications. Comments: Peripheral vein in antecubital fossa RUE identified and flash  obtained but unable to cannulate on attempt x2. Pt obese and difficult to even find EJ on exam. Identified with linear array US probe and R external jugular vein cannulated with 18g peripheral angiocath. Line easily flushed and aspirated.   (including critical care time)  CRITICAL CARE Performed by: Virgel Manifold   Total critical care time: 90 minutes  Critical care time was exclusive of separately billable  procedures and treating other patients.  Critical care was necessary to treat or prevent imminent or life-threatening deterioration.  Critical care was time spent personally by me on the following activities: development of treatment plan with patient and/or surrogate as well as nursing, discussions with consultants, evaluation of patient's response to treatment, examination of patient, obtaining history from patient or surrogate, ordering and performing treatments and interventions, ordering and review of laboratory studies, ordering and review of radiographic studies, pulse oximetry and re-evaluation of patient's condition.    Labs Reviewed  CBC  TROPONIN I  BASIC METABOLIC PANEL   No results found.  8:27 AM Very high clinical concern for PE. STEMI called prehospital but EKG nondiagnostic and cath lab canceled for time being.  Sinus tach with RBBB, presumably new. Pt denies pain. Just severe dyspnea. Plan cardiac eval and CTA. Empiric heparin prior to CT. Pt currently normotensive to hypertensive. Satting well with supplemental O2. PCXR with no acute findings.  8:51 AM Previous records reviewed. Cr 07/11/11 was 1.9. Discussed with cards. STAT ECHO to assess RV function if cannot obtain CTA.  10:42 AM Unable to obtain adequate IV access for CTA by nursing and IV team. R EJ cannnulated with 18g PIV with good flush and return. Pt tolerated well.  11:53 AM Elevated troponin just seen myself.  I was not notified by nursing about abnormal result. Cards paged.  2:43 PM Results discussed with pt and wife. Wife increasingly upset."I wish this would have happened in [place] so we'd be at the hospital there." "Why can't you tell me where the clot came from."  Discussed need for hospitalization and anticipated course including continued anticoagulation, further testing and evaluation for clotting disorders/etc which is part on typical w/u of PE, need for long term anticoagulation, etc. Pt satisfied  with answers and understand plan of care. Wife continues to remain upset though.  4:09 PM Markedly elevated troponin on repeat. Discussed with C[place] after first resulted and not particularly concerned at that time given likely PE. Will obtain another repeat EKG and discuss again.  4:30 PM Spoke with Crenshaw concerning second troponin. Without other symptoms or EKG changes suggestive, still thinks all related to PE.  EKG:  Rhythm: sinus tachycardia Rate: 121 Axis: borderline left Intervals: normals aside from QRS ST segments: diffuse depression  EKG: Repeat Rhythm: sinus tachycadia Rate: 106 Axis: normal Intervals: normal. q wave III.  ST segments:NS ST changed. Flipped t wave in III.     1. Pulmonary embolism   2. DM (diabetes mellitus) type II controlled with renal manifestation   3. HTN (hypertension)   4. PE (pulmonary embolism)       MDM  45yM with pulmonary embolism.  Still mildy tachy around 100. Still with some dyspnea but improved. Supplemental O2 weaned to [place] and 100%. Normo to slightly hypertensive. On heparin gtt since shortly after arrival in ED. Feel appropriate for admission to stepdown. Will discuss with admitting team. Troponin elevation likely 2/2 to PE. EKG with sinus tach and findings suggestive of PE.         Annie Main  Wilson Singer, MD 07/18/11 [place], MD 07/24/11 [phone]

## 2011-07-18 NOTE — ED Notes (Signed)
Recently seen at cancer center for very high WBC levels, had bone marrow test done and every thing is negative

## 2011-07-18 NOTE — ED Notes (Signed)
Echo tech at bedside.

## 2011-07-18 NOTE — Progress Notes (Signed)
*  PRELIMINARY RESULTS* Echocardiogram 2D Echocardiogram has been performed.  Isaiah [name] RDCS 07/18/2011, 9:33 AM

## 2011-07-18 NOTE — Progress Notes (Signed)
ANTICOAGULATION CONSULT NOTE - Follow Up Consult  Pharmacy Consult for Heparin  Indication: Likely pulmonary embolis  Allergies  Allergen Reactions  . Codeine Rash    Patient Measurements: Height: 5' 10"  (177.8 cm) Weight: 293 lb (132.904 kg) IBW/kg (Calculated) : 73  Heparin Weight: 104 kg  Vital Signs: Temp: 97.3 F (36.3 C) (11/09 0952) Temp src: Oral (11/09 0952) BP: 129/73 mmHg (11/09 1513) Pulse Rate: 106  (11/09 1513)  Labs:  Basename 07/18/11 1417 07/18/11 1337 07/18/11 0840  HGB -- -- 10.0*  HCT -- -- 29.3*  PLT -- -- 303  APTT -- -- --  LABPROT -- -- --  INR -- -- --  HEPARINUNFRC -- 0.15* --  CREATININE -- -- 2.31*  CKTOTAL -- -- --  CKMB -- -- --  TROPONINI >25.00* -- 1.79*   Estimated Creatinine Clearance: 54.2 ml/min (by C-G formula based on Cr of 2.31).   Medications:   (Not in a hospital admission) Scheduled:    . heparin  4,000 Units Intravenous Once  . heparin  1,250 Units/hr Intravenous Once  . insulin aspart  10 Units Subcutaneous Once  . sodium chloride  1,000 mL Intravenous Once  . DISCONTD: aspirin  324 mg Oral Once  . DISCONTD: aspirin  324 mg Oral Once  . DISCONTD: heparin  2,300 Units/hr Intravenous Once  . DISCONTD: heparin  10,400 Units Intravenous Once  . DISCONTD: insulin aspart  10 Units Intravenous Once    Assessment: 45 y.o. M on heparin originally for ACS sx and now found to likely have PE on VQ scan. Heparin level this afternoon is SUBtherapeutic. Heparin infusing at proper rate--no s/sx of bleeding noted.  Goal of Therapy:  Heparin level 0.3-0.7 units/ml   Plan:  1) Give heparin bolus of 3000 units x 1 2) Increase heparin drip rate to 1600 units/hr (40m/hr) 3) Obtain heparin level 6 hours after rate change 4) Will continue to monitor for any signs/symptoms of bleeding  MLawson Radar11/05/2011,4:07 PM

## 2011-07-18 NOTE — ED Notes (Signed)
Iv team at bedside  

## 2011-07-18 NOTE — ED Notes (Signed)
Pt given peanut butter crackers and diet sprite, ordered diet tray

## 2011-07-18 NOTE — ED Notes (Signed)
IV team paged and will come to see pt

## 2011-07-18 NOTE — ED Notes (Addendum)
Pt here this morning with SOB and difficulty breathing, colllapsed while trying to get into car.

## 2011-07-18 NOTE — ED Notes (Signed)
Pt to nuc med for vq scan

## 2011-07-18 NOTE — Progress Notes (Signed)
Heparin/coumadin  Pt has been started on heparin. A level is due tonight. Coumadin is added tonight for PE.  INR goal = 2-3  Plan:  Cont heparin at 1600 units/hr Check baseline INR Coumadin 25m PO x1 Daily PT/INR Coumadin teaching book/video

## 2011-07-18 NOTE — ED Notes (Signed)
Called floor to give report nurse unavailable, "will call back"

## 2011-07-18 NOTE — H&P (Signed)
PCP:   Dr. Wadie Lessen  Chief Complaint:  Shortness of breath, palpitations and chest discomfort.  HPI: 45 year old male with a past medical history significant for hypertension, diabetes, sickle cell thalassemia, and chronic kidney disease (Stage 1-2). Who came into the hospital secondary to increase shortness of breath, palpitations and chest discomfort. Patient denies any fever, any chills, no abdominal pain, no nausea/vomiting or any other acute complaints. Patient was found to have elevated cardiac enzymes, cardiology was initially consulted, and base the patient history and presentation was positive for high concerns PE; at that moment a bedside 2-D echo was done and demonstrated right ventricle strain. Also with high probability V-Q scan for PE. [facility] hospitalist was called to admit the patient and provide further evaluation and treatment.   Allergies:   Allergies  Allergen Reactions  . Codeine Rash      Past Medical History  Diagnosis Date  . Diabetes mellitus   . Hypertension     Past Surgical History  Procedure Date  . Cholecystectomy     Prior to Admission medications   Medication Sig Start Date End Date Taking? Authorizing Provider  aspirin 81 MG tablet Take 81 mg by mouth daily.     Yes Historical Provider, MD  insulin glargine (LANTUS) 100 UNIT/ML injection Inject 65 Units into the skin daily.     Yes Historical Provider, MD  insulin lispro (HUMALOG) 100 UNIT/ML injection Inject 10 Units into the skin 3 (three) times daily before meals.     Yes Historical Provider, MD  valsartan-hydrochlorothiazide (DIOVAN-HCT) 320-25 MG per tablet Take 1 tablet by mouth daily.     Yes Historical Provider, MD    Social History:  reports that he has never smoked. He does not have any smokeless tobacco history on file. He reports that he does not drink alcohol or use illicit drugs.  Family History  Problem Relation Age of Onset  . Sickle cell anemia Brother   . Diabetes type I  Brother   . Kidney disease Brother     Review of Systems:  Constitutional: Denies fever, chills, diaphoresis, appetite change and fatigue.  HEENT: Denies photophobia, eye pain, redness, hearing loss, ear pain, congestion, sore throat, rhinorrhea, sneezing, mouth sores, trouble swallowing, neck pain, neck stiffness and tinnitus.   Respiratory: Denies SOB, DOE, cough, chest tightness,  and wheezing.   Cardiovascular: Denies chest pain, palpitations and leg swelling.  Gastrointestinal: Denies nausea, vomiting, abdominal pain, diarrhea, constipation, blood in stool and abdominal distention.  Genitourinary: Denies dysuria, urgency, frequency, hematuria, flank pain and difficulty urinating.  Musculoskeletal: Denies myalgias, back pain, joint swelling, arthralgias and gait problem.  Skin: Denies pallor, rash and wound.  Neurological: Denies dizziness, seizures, syncope, weakness, light-headedness, numbness and headaches.  Hematological: Denies adenopathy. Easy bruising, personal or family bleeding history  Psychiatric/Behavioral: Denies suicidal ideation, mood changes, confusion, nervousness, sleep disturbance and agitation   Physical Exam: Blood pressure 112/78, pulse 109, temperature 97.9 F (36.6 C), temperature source Oral, resp. rate 18, height 5' 10"  (1.778 m), weight 132.904 kg (293 lb), SpO2 98.00%. Gen: Lying in bed, comfortable, chills mild shortness of breath at the end of the sentences during the interview; patient was cooperative to examination. Heent: Normocephalic, no trauma, PERRLA, EOMI, no ictericia, no nystagmus, moist mucose membranes. Neck: Supple, no bruits, no thyromegaly. Resp: Clear to auscultation bilaterally. Heart: Mild tachycardia, no rubs gallops or murmurs. Abdomen: Soft, nontender, nondistended, positive bowel sounds. Ext: No edema or cyanosis; patient denies any pain on palpation of his  calves bilaterally. Skin: No rashes, no open wounds/bruises. Neuro: Alert,  awake and oriented x3, cranial nerves 2-12 grossly intact, muscle strength 5/5 bilaterally symmetrically, non-focal neurologic deficit.  Labs on Admission:  Results for orders placed during the hospital encounter of 07/18/11 (from the past 48 hour(s))  CBC     Status: Abnormal   Collection Time   07/18/11  8:40 AM      Component Value Range Comment   WBC 18.4 (*) 4.0 - 10.5 (K/uL)    RBC 3.96 (*) 4.22 - 5.81 (MIL/uL)    Hemoglobin 10.0 (*) 13.0 - 17.0 (g/dL)    HCT 29.3 (*) 39.0 - 52.0 (%)    MCV 74.0 (*) 78.0 - 100.0 (fL)    MCH 25.3 (*) 26.0 - 34.0 (pg)    MCHC 34.1  30.0 - 36.0 (g/dL)    RDW 17.4 (*) 11.5 - 15.5 (%)    Platelets 303  150 - 400 (K/uL)   TROPONIN I     Status: Abnormal   Collection Time   07/18/11  8:40 AM      Component Value Range Comment   Troponin I 1.79 (*) <0.30 (ng/mL)   BASIC METABOLIC PANEL     Status: Abnormal   Collection Time   07/18/11  8:40 AM      Component Value Range Comment   Sodium 136  135 - 145 (mEq/L)    Potassium 5.5 (*) 3.5 - 5.1 (mEq/L)    Chloride 106  96 - 112 (mEq/L)    CO2 17 (*) 19 - 32 (mEq/L)    Glucose, Bld 363 (*) 70 - 99 (mg/dL)    BUN 33 (*) 6 - 23 (mg/dL)    Creatinine, Ser 2.31 (*) 0.50 - 1.35 (mg/dL)    Calcium 8.9  8.4 - 10.5 (mg/dL)    GFR calc non Af Amer 32 (*) >90 (mL/min)    GFR calc Af Amer 37 (*) >90 (mL/min)   GLUCOSE, CAPILLARY     Status: Abnormal   Collection Time   07/18/11 11:15 AM      Component Value Range Comment   Glucose-Capillary 292 (*) 70 - 99 (mg/dL)   HEPARIN LEVEL     Status: Abnormal   Collection Time   07/18/11  1:37 PM      Component Value Range Comment   Heparin Unfractionated 0.15 (*) 0.30 - 0.70 (IU/mL)   TROPONIN I     Status: Abnormal   Collection Time   07/18/11  2:17 PM      Component Value Range Comment   Troponin I >25.00 (*) <0.30 (ng/mL)     Radiological Exams on Admission: CXR 07/17/11 IMPRESSION:  Borderline heart size. No active disease.  2-D echo 07/17/11  RVH and  strain, indicating increase pulmonary pressure most likely secondary to PE.  NM Pulmonary Per & Vent IMPRESSION:  High probability for pulmonary embolism.   Assessment/Plan 1-SOB, tachycardia and chest discomfort: 2/2 pulmonary embolism; patient will be admitted to same, heparin drip per pharmacy will be started; will start also warfarin therapy per pharmacy.  Cardiac enzymes most likely secondary to PE, nonetheless  will continue checking his cardiac enzymes every 8 hours in order to follow the trend; cardiology has been already consulted.   2-Hyperkalemia: Secondary to worsening of patient's kidney function, hyperglycemia status, and the use of any of these. Patient will be started on insulin, will follow be made, at this point no changes on EKG I would require further action for  this mild elevation of patient's potassium.  3-ARF (acute renal failure): Secondary to acute pulmonary embolism, most likely due to decreased perfusion. Also secondary to concomitant use of nephrotoxic agent (Diovan). We'll provide fluid resuscitation, of nephrotoxic agents, will check a urinalysis, will repeat renal function test in the morning. Of note, patient has a history of chronic kidney disease stage 1-2 at baseline.   4-Leukocytosis, unspecified: Followed by Dr. Alen Blew, appears to be secondary to his sickle cell thalassemia. Is not an acute condition and has been worked out already (including BM biopsy, unremarkable)  5-Sickle cell-thalassemia disease: Stable, he will continue to follow with Dr. Alen Blew as an outpatient.  6-DM (diabetes mellitus) type II controlled with renal manifestation: Continue Lantus, will start the patient on sliding scale insulin and will check hemoglobin A1c.   7-Cardiac enzymes elevated: Most likely secondary to patient's pulmonary embolism, cardiology has been already consulted and at this point no further recommendations have been given. A 2-D echo has been done not showing any  significant hypokinesis and just revealing right ventricle hypertrophy and strain due to the patient's pulmonary embolism. We'll continue following trend of patient's cardiac enzymes and will provide tx for PE.  8-HTN: Will stop diovan; will use PRN hydralazine for SBP >172 or diastolic >091.   Time Spent on Admission: 55 minutes  Aalani Aikens 07/18/2011, 6:08 PM

## 2011-07-18 NOTE — ED Notes (Signed)
Pt states that he has had no chest pain only SOB today.

## 2011-07-18 NOTE — ED Notes (Signed)
Report given to [place], [place]

## 2011-07-18 NOTE — ED Notes (Signed)
MD at bedside attempting u/s guided PIV

## 2011-07-19 DIAGNOSIS — I2699 Other pulmonary embolism without acute cor pulmonale: Secondary | ICD-10-CM

## 2011-07-19 DIAGNOSIS — R0602 Shortness of breath: Secondary | ICD-10-CM

## 2011-07-19 LAB — GLUCOSE, CAPILLARY
Glucose-Capillary: 148 mg/dL — ABNORMAL HIGH (ref 70–99)
Glucose-Capillary: 199 mg/dL — ABNORMAL HIGH (ref 70–99)
Glucose-Capillary: 102 mg/dL — ABNORMAL HIGH (ref 70–99)
Glucose-Capillary: 127 mg/dL — ABNORMAL HIGH (ref 70–99)

## 2011-07-19 LAB — CBC
HCT: 26.7 % — ABNORMAL LOW (ref 39.0–52.0)
Hemoglobin: 8.9 g/dL — ABNORMAL LOW (ref 13.0–17.0)
MCH: 24.4 pg — ABNORMAL LOW (ref 26.0–34.0)
MCHC: 33.3 g/dL (ref 30.0–36.0)
MCV: 73.2 fL — ABNORMAL LOW (ref 78.0–100.0)
Platelets: 302 10*3/uL (ref 150–400)
RBC: 3.65 MIL/uL — ABNORMAL LOW (ref 4.22–5.81)
RDW: 17.7 % — ABNORMAL HIGH (ref 11.5–15.5)
WBC: 20.7 10*3/uL — ABNORMAL HIGH (ref 4.0–10.5)

## 2011-07-19 LAB — BASIC METABOLIC PANEL
BUN: 37 mg/dL — ABNORMAL HIGH (ref 6–23)
CO2: 18 mEq/L — ABNORMAL LOW (ref 19–32)
Calcium: 8.3 mg/dL — ABNORMAL LOW (ref 8.4–10.5)
Chloride: 111 mEq/L (ref 96–112)
Creatinine, Ser: 2.5 mg/dL — ABNORMAL HIGH (ref 0.50–1.35)
GFR calc Af Amer: 34 mL/min — ABNORMAL LOW (ref >90)
GFR calc non Af Amer: 29 mL/min — ABNORMAL LOW (ref >90)
Glucose, Bld: 153 mg/dL — ABNORMAL HIGH (ref 70–99)
Potassium: 5.3 mEq/L — ABNORMAL HIGH (ref 3.5–5.1)
Sodium: 137 mEq/L (ref 135–145)

## 2011-07-19 LAB — HEPARIN LEVEL (UNFRACTIONATED)
Heparin Unfractionated: 0.23 IU/mL — ABNORMAL LOW (ref 0.30–0.70)
Heparin Unfractionated: 0.57 IU/mL (ref 0.30–0.70)

## 2011-07-19 LAB — CARDIAC PANEL(CRET KIN+CKTOT+MB+TROPI)
CK, MB: 25.5 ng/mL (ref 0.3–4.0)
CK, MB: 18 ng/mL (ref 0.3–4.0)
Relative Index: 4.2 — ABNORMAL HIGH (ref 0.0–2.5)
Relative Index: 3.7 — ABNORMAL HIGH (ref 0.0–2.5)
Total CK: 613 U/L — ABNORMAL HIGH (ref 7–232)
Total CK: 487 U/L — ABNORMAL HIGH (ref 7–232)
Troponin I: 13.85 ng/mL (ref <0.30)
Troponin I: 8.38 ng/mL (ref <0.30)

## 2011-07-19 LAB — PROTIME-INR
INR: 1.15 (ref 0.00–1.49)
Prothrombin Time: 14.9 seconds (ref 11.6–15.2)

## 2011-07-19 LAB — HOMOCYSTEINE: Homocysteine: 21.9 umol/L — ABNORMAL HIGH (ref 4.0–15.4)

## 2011-07-19 LAB — ANTITHROMBIN III: AntiThromb III Func: 75 % (ref 75–120)

## 2011-07-19 MED ORDER — INSULIN ASPART 100 UNIT/ML ~~LOC~~ SOLN
0.0000 [IU] | Freq: Three times a day (TID) | SUBCUTANEOUS | Status: DC
  Filled 2011-07-19: qty 3

## 2011-07-19 MED ORDER — WARFARIN SODIUM 10 MG PO TABS
10.0000 mg | ORAL_TABLET | Freq: Once | ORAL | Status: AC
  Administered 2011-07-19: 10 mg via ORAL
  Filled 2011-07-19: qty 1

## 2011-07-19 MED ORDER — HEPARIN BOLUS VIA INFUSION
2500.0000 [IU] | Freq: Once | INTRAVENOUS | Status: AC
  Administered 2011-07-19: 2500 [IU] via INTRAVENOUS
  Filled 2011-07-19: qty 2500

## 2011-07-19 MED ORDER — INSULIN ASPART 100 UNIT/ML ~~LOC~~ SOLN
10.0000 [IU] | Freq: Three times a day (TID) | SUBCUTANEOUS | Status: DC
  Administered 2011-07-19 – 2011-07-22 (×7): 10 [IU] via SUBCUTANEOUS

## 2011-07-19 NOTE — Progress Notes (Signed)
*  PRELIMINARY RESULTS* Bilateral lower extremity venous duplex completed. No evidence of DVT, superficial thrombosis, or Baker's cyst.  Birdena Crandall, [place] D 07/19/2011, 12:11 PM

## 2011-07-19 NOTE — Progress Notes (Signed)
ANTICOAGULATION CONSULT NOTE - Follow Up Consult  Pharmacy Consult for Heparin/Coumadin Indication: pulmonary embolus  Allergies  Allergen Reactions  . Codeine Rash    Patient Measurements: Height: 5' 10"  (177.8 cm) Weight: 295 lb 6.7 oz (134 kg) IBW/kg (Calculated) : 73    Vital Signs: Temp: 97.3 F (36.3 C) (11/10 1243) Temp src: Oral (11/10 1243) BP: 128/77 mmHg (11/10 1243) Pulse Rate: 82  (11/10 1200)  Labs:  Basename 07/19/11 1227 07/19/11 0849 07/19/11 0500 07/18/11 2245 07/18/11 2221 07/18/11 2213 07/18/11 1417 07/18/11 1337 07/18/11 0840  HGB -- -- 8.9* -- -- -- -- -- 10.0*  HCT -- -- 26.7* -- -- -- -- -- 29.3*  PLT -- -- 302 -- -- -- -- -- 303  APTT -- -- -- -- -- -- -- -- --  LABPROT -- -- 14.9 -- -- 14.6 -- -- --  INR -- -- 1.15 -- -- 1.12 -- -- --  HEPARINUNFRC 0.57 -- 0.23* -- -- -- -- 0.15* --  CREATININE -- 2.50* -- -- 2.49* -- -- -- 2.31*  CKTOTAL -- 613* -- 1081* -- -- -- -- --  CKMB -- 25.5* -- 62.2* -- -- -- -- --  TROPONINI -- 13.85* -- >25.00* -- -- >25.00* -- --   Estimated Creatinine Clearance: 50.3 ml/min (by C-G formula based on Cr of 2.5).   Medications:  Scheduled:    . aspirin EC  81 mg Oral Daily  . coumadin book   Does not apply Once  . heparin  2,500 Units Intravenous Once  . heparin  3,000 Units Intravenous Once  . insulin aspart  0-9 Units Subcutaneous TID WC  . insulin aspart  10 Units Subcutaneous Once  . insulin aspart  10 Units Subcutaneous TID WC  . insulin glargine  65 Units Subcutaneous Daily  . warfarin  10 mg Oral Once  . warfarin   Does not apply Once  . DISCONTD: aspirin  81 mg Oral Daily  . DISCONTD: insulin aspart  0-9 Units Subcutaneous TID WC  . DISCONTD: insulin aspart  3 Units Subcutaneous TID WC    Assessment: Heparin level therapeutic.  Day #2 of Coumadin today Goal of Therapy:  INR 2-3, Heparin level 0.3-0.7   Plan:  Continue Heparin at the same rate.  Coumadin 25m PO today.  Check AM heparin  level, CBC, INR.  FCandie Mile11/06/2011,2:25 PM

## 2011-07-19 NOTE — Plan of Care (Signed)
Problem: Phase I Progression Outcomes Goal: Initial discharge plan identified Outcome: Progressing Plan to discharge to home; Home Needs Assessment

## 2011-07-19 NOTE — Progress Notes (Signed)
Subjective: No longer has dyspnea or palpitations. No chest pain. No h/o of clotting in the family. He has never had a DVT. No h/o recent travel. 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   Objective: Patient Vitals for the past 24 hrs:  BP Temp Temp src Pulse Resp SpO2 Height Weight  07/19/11 0512 - 98.3 F (36.8 C) Oral - - - - -  07/19/11 0500 116/65 mmHg - - 96  17  99 % - -  07/19/11 0300 111/67 mmHg - - 104  20  99 % - -  07/19/11 0143 - 98.3 F (36.8 C) Oral - - - - 134 kg (295 lb 6.7 oz)  07/19/11 0100 112/75 mmHg - - 103  21  99 % - -  07/19/11 0000 110/66 mmHg - - 102  18  99 % - -  07/18/11 2300 130/75 mmHg - - 105  21  99 % - -  07/18/11 2200 127/79 mmHg - - 105  22  100 % - -  07/18/11 2124 125/79 mmHg 97.9 F (36.6 C) Oral 103  21  99 % 5' 10"  (1.778 m) 131 kg (288 lb 12.8 oz)  07/18/11 1938 130/76 mmHg 97.9 F (36.6 C) Oral 98  18  97 % - -  07/18/11 1811 126/82 mmHg - - 96  20  97 % - -  07/18/11 1710 112/78 mmHg 97.9 F (36.6 C) Oral 109  18  98 % - -  07/18/11 1513 129/73 mmHg - - 106  20  100 % - -  07/18/11 1401 119/80 mmHg - - - - - - -  07/18/11 1359 135/91 mmHg - - 97  18  100 % - -  07/18/11 1110 126/72 mmHg - - 117  20  99 % - -  07/18/11 0952 118/66 mmHg 97.3 F (36.3 C) Oral 105  22  100 % - -  07/18/11 0830 - - - - - - 5' 10"  (1.778 m) 132.904 kg (293 lb)   Weight change:   Intake/Output Summary (Last 24 hours) at 07/19/11 1937 Last data filed at 07/19/11 0700  Gross per 24 hour  Intake   1355 ml  Output   1300 ml  Net     55 ml    Physical Exam: General appearance: alert, cooperative and no distress, Morbidly obese. Head: Normocephalic, without obvious abnormality, atraumatic Throat: lips, mucosa, and tongue normal; teeth and gums normal Neck: no adenopathy, no carotid bruit, no JVD, supple, symmetrical, trachea midline and thyroid not enlarged, symmetric, no tenderness/mass/nodules Lungs: clear to  auscultation bilaterally Heart: regular rate and rhythm, S1, S2 normal, no murmur, click, rub or gallop Abdomen: soft, non-tender; bowel sounds normal; no masses,  no organomegaly Extremities: extremities normal, atraumatic, no cyanosis or edema Skin: Skin color, texture, turgor normal. No rashes or lesions  Lab Results:  [facility] 07/18/11 2221 07/18/11 0840  NA 136 136  K 5.9* 5.5*  CL 107 106  CO2 19 17*  GLUCOSE 231* 363*  BUN 41* 33*  CREATININE 2.49* 2.31*  CALCIUM 8.7 8.9  MG -- --  PHOS -- --   No results found for this basename: AST:2,ALT:2,ALKPHOS:2,BILITOT:2,PROT:2,ALBUMIN:2 in the last 72 hours No results found for this basename: LIPASE:2,AMYLASE:2 in the last 72 hours  Basename 07/19/11 0500 07/18/11 0840  WBC 20.7* 18.4*  NEUTROABS -- --  HGB 8.9* 10.0*  HCT 26.7* 29.3*  MCV 73.2*  74.0*  PLT 302 303    Basename 07/18/11 2245 07/18/11 1417 07/18/11 0840  CKTOTAL 1081* -- --  CKMB 62.2* -- --  CKMBINDEX -- -- --  TROPONINI >25.00* >25.00* 1.79*   No results found for this basename: POCBNP:3 in the last 72 hours No results found for this basename: DDIMER:2 in the last 72 hours  Basename 07/18/11 1337  HGBA1C 6.1*   No results found for this basename: CHOL:2,HDL:2,LDLCALC:2,TRIG:2,CHOLHDL:2,LDLDIRECT:2 in the last 72 hours  Basename 07/18/11 1337  TSH 1.425  T4TOTAL --  T3FREE --  THYROIDAB --   No results found for this basename: VITAMINB12:2,FOLATE:2,FERRITIN:2,TIBC:2,IRON:2,RETICCTPCT:2 in the last 72 hours  Micro Results: Recent Results (from the past 240 hour(s))  MRSA PCR SCREENING     Status: Normal   Collection Time   07/18/11  9:38 PM      Component Value Range Status Comment   MRSA by PCR NEGATIVE  NEGATIVE  Final     Studies/Results: Nm Pulmonary Per & Vent  07/18/2011  *RADIOLOGY REPORT*  Clinical Data:  Shortness of breath  NUCLEAR MEDICINE VENTILATION - PERFUSION LUNG SCAN  Technique:  Wash-in, equilibrium, and wash-out phase  ventilation images were obtained using Xe-133 gas.  Perfusion images were obtained in multiple projections after intravenous injection of Tc- 3mMAA.  Radiopharmaceuticals:  10 mCi Xe-133 gas and 6 mCi Tc-942mAA.  Comparison:  None Correlation:  Chest radiograph 07/18/2011  Findings: Ventilation exam in anterior and posterior projections is normal. Perfusion lung scan in eight projections demonstrates multiple segmental and subsegmental perfusion defects in both lungs.  Chest radiograph is clear.  Findings represent a high probability for pulmonary embolism.  IMPRESSION: High probability for pulmonary embolism.  Findings called to Dr. KoWilson Singern 07/18/2011 at 1424 hours.  Original Report Authenticated By: MABurnetta SabinM.D.   Dg Chest Portable 1 View  07/18/2011  *RADIOLOGY REPORT*  Clinical Data: Shortness of breath.  PORTABLE CHEST - 1 VIEW  Comparison: None.  Findings: Heart is borderline in size.  Lungs are clear.  No effusions.  No acute bony abnormality.  IMPRESSION: Borderline heart size.  No active disease.  Original Report Authenticated By: KERaelyn NumberM.D.   ECHO 07/18/11  - Left ventricle: The cavity size was normal. Wall thickness was increased in a pattern of mild LVH. The estimated ejection fraction was 60%. Wall motion was normal; there were no regional wall motion abnormalities. - Right ventricle: RV difficult to assess fully. I can not rule outsome RV dilitation and some RV dysfunction.   Medications: Scheduled Meds:   . aspirin EC  81 mg Oral Daily  . coumadin book   Does not apply Once  . heparin  2,500 Units Intravenous Once  . heparin  3,000 Units Intravenous Once  . heparin  4,000 Units Intravenous Once  . heparin  1,250 Units/hr Intravenous Once  . insulin aspart  0-9 Units Subcutaneous TID WC  . insulin aspart  10 Units Subcutaneous Once  . insulin aspart  3 Units Subcutaneous TID WC  . insulin glargine  65 Units Subcutaneous Daily  . sodium chloride  1,000 mL  Intravenous Once  . warfarin  10 mg Oral Once  . warfarin   Does not apply Once  . DISCONTD: aspirin  324 mg Oral Once  . DISCONTD: aspirin  324 mg Oral Once  . DISCONTD: aspirin  81 mg Oral Daily  . DISCONTD: heparin  2,300 Units/hr Intravenous Once  . DISCONTD: heparin  10,400 Units  Intravenous Once  . DISCONTD: insulin aspart  10 Units Intravenous Once   Continuous Infusions:   . sodium chloride 100 mL/hr at 07/19/11 0053  . heparin 1,600 Units/hr (07/19/11 0100)  . DISCONTD: heparin     PRN Meds:.acetaminophen, acetaminophen, alum & mag hydroxide-simeth, hydrALAZINE, morphine, ondansetron (ZOFRAN) IV, ondansetron, technetium albumin aggregated, xenon xe 133  Assessment/Plan: Principal Problem:  *PE (pulmonary embolism)/ acute respiratory failure - symptoms improved - RV strain noted on ECHO along with troponin elevation (>25) - May be related to his sedentary work however he does need a hypercoagulable work up. He has seen Dr Alen Blew in the past for his leukocytosis. I will ask that he f/u with him for results of the hypercoagulable test results.  - Cont Heparin and Coumadin.  - Lower ext doppler ordered and pending.   Active Problems:   SOB (shortness of breath)- as above  Cardiac enzymes elevated- as above    Hyperkalemia- due to renal failure- Diovan on hold. Rechecking labs this AM.    ARF (acute renal failure) on CKD 2- due to PE and resultant reduced cardiac output. Diovan on hold. Will cont to follow.   Metabolic acidosis- likely from renal failure  Leukocytosis, unspecified- has been worked up by Dr Alen Blew and per patient no cause was found  Sickle cell trait  DM (diabetes mellitus) type II  renal manifestation- cont home meds- Lantus 65 units and Humalog (10 u TID/AC) and a low dose sliding scale. I have adjusted the sliding scale.      HTN (hypertension)- BP low. Holding Diovan/HCTZ  Morbid Obesity   LOS: 1 day   [place] 07/19/2011, 8:23  AM

## 2011-07-19 NOTE — Progress Notes (Signed)
Heparin  Pt has been started on heparin. A level was due at 2230 last night which was in process for hours. Lab could not result results as sample was verified as there but too old for a heparin level. We had the am lab to draw and it has resulted at 0.23 . Will bolus patient 2500 unitsx1 and increase to 1900 units/hr with a 6 hour f/u level.   Goal 0.3-0.7

## 2011-07-20 LAB — BASIC METABOLIC PANEL
BUN: 32 mg/dL — ABNORMAL HIGH (ref 6–23)
CO2: 17 mEq/L — ABNORMAL LOW (ref 19–32)
Calcium: 8.6 mg/dL (ref 8.4–10.5)
Chloride: 112 mEq/L (ref 96–112)
Creatinine, Ser: 2.33 mg/dL — ABNORMAL HIGH (ref 0.50–1.35)
GFR calc Af Amer: 37 mL/min — ABNORMAL LOW (ref >90)
GFR calc non Af Amer: 32 mL/min — ABNORMAL LOW (ref >90)
Glucose, Bld: 71 mg/dL (ref 70–99)
Potassium: 5.7 mEq/L — ABNORMAL HIGH (ref 3.5–5.1)
Sodium: 138 mEq/L (ref 135–145)

## 2011-07-20 LAB — CBC
HCT: 26.5 % — ABNORMAL LOW (ref 39.0–52.0)
Hemoglobin: 9.2 g/dL — ABNORMAL LOW (ref 13.0–17.0)
MCH: 25.3 pg — ABNORMAL LOW (ref 26.0–34.0)
MCHC: 34.7 g/dL (ref 30.0–36.0)
MCV: 73 fL — ABNORMAL LOW (ref 78.0–100.0)
Platelets: 274 10*3/uL (ref 150–400)
RBC: 3.63 MIL/uL — ABNORMAL LOW (ref 4.22–5.81)
RDW: 18 % — ABNORMAL HIGH (ref 11.5–15.5)
WBC: 20.4 10*3/uL — ABNORMAL HIGH (ref 4.0–10.5)

## 2011-07-20 LAB — GLUCOSE, CAPILLARY
Glucose-Capillary: 65 mg/dL — ABNORMAL LOW (ref 70–99)
Glucose-Capillary: 90 mg/dL (ref 70–99)
Glucose-Capillary: 136 mg/dL — ABNORMAL HIGH (ref 70–99)
Glucose-Capillary: 142 mg/dL — ABNORMAL HIGH (ref 70–99)

## 2011-07-20 LAB — PROTIME-INR
INR: 1.14 (ref 0.00–1.49)
Prothrombin Time: 14.8 seconds (ref 11.6–15.2)

## 2011-07-20 LAB — HEPARIN LEVEL (UNFRACTIONATED): Heparin Unfractionated: 0.55 IU/mL (ref 0.30–0.70)

## 2011-07-20 MED ORDER — WARFARIN SODIUM 10 MG PO TABS
10.0000 mg | ORAL_TABLET | Freq: Once | ORAL | Status: AC
  Administered 2011-07-20: 10 mg via ORAL
  Filled 2011-07-20: qty 1

## 2011-07-20 MED ORDER — SODIUM CHLORIDE 0.9 % IV SOLN
INTRAVENOUS | Status: DC
  Administered 2011-07-20: 08:00:00 via INTRAVENOUS

## 2011-07-20 MED ORDER — INSULIN GLARGINE 100 UNIT/ML ~~LOC~~ SOLN
40.0000 [IU] | SUBCUTANEOUS | Status: DC
  Administered 2011-07-20 – 2011-07-21 (×2): 40 [IU] via SUBCUTANEOUS

## 2011-07-20 MED ORDER — GLUCOSE-VITAMIN C 4-6 GM-MG PO CHEW
CHEWABLE_TABLET | ORAL | Status: AC
  Filled 2011-07-20: qty 1

## 2011-07-20 MED ORDER — INSULIN GLARGINE 100 UNIT/ML ~~LOC~~ SOLN
40.0000 [IU] | Freq: Every day | SUBCUTANEOUS | Status: DC
  Filled 2011-07-20: qty 3

## 2011-07-20 NOTE — Progress Notes (Signed)
Called [address] to give report to RN that will be receiving pt to Room 2035.  [place], RN stated "I'll call you back shortly."

## 2011-07-20 NOTE — Progress Notes (Signed)
ANTICOAGULATION CONSULT NOTE - Follow Up Consult  Pharmacy Consult for Heparin/Coumadin Indication: pulmonary embolus  Allergies  Allergen Reactions  . Codeine Rash    Patient Measurements: Height: 5' 10"  (177.8 cm) Weight: 295 lb 6.7 oz (134 kg) IBW/kg (Calculated) : 73    Vital Signs: Temp: 98 F (36.7 C) (11/11 1232) Temp src: Oral (11/11 1232) BP: 139/80 mmHg (11/11 1232) Pulse Rate: 85  (11/11 0600)  Labs:  Basename 07/20/11 0500 07/19/11 1530 07/19/11 1227 07/19/11 0849 07/19/11 0500 07/18/11 2245 07/18/11 2221 07/18/11 2213 07/18/11 0840  HGB 9.2* -- -- -- 8.9* -- -- -- --  HCT 26.5* -- -- -- 26.7* -- -- -- 29.3*  PLT 274 -- -- -- 302 -- -- -- 303  APTT -- -- -- -- -- -- -- -- --  LABPROT 14.8 -- -- -- 14.9 -- -- 14.6 --  INR 1.14 -- -- -- 1.15 -- -- 1.12 --  HEPARINUNFRC 0.55 -- 0.57 -- 0.23* -- -- -- --  CREATININE 2.33* -- -- 2.50* -- -- 2.49* -- --  CKTOTAL -- 487* -- 613* -- 1081* -- -- --  CKMB -- 18.0* -- 25.5* -- 62.2* -- -- --  TROPONINI -- 8.38* -- 13.85* -- >25.00* -- -- --   Estimated Creatinine Clearance: 54 ml/min (by C-G formula based on Cr of 2.33).   Medications:  Scheduled:     . aspirin EC  81 mg Oral Daily  . insulin aspart  0-9 Units Subcutaneous TID WC  . insulin aspart  10 Units Subcutaneous TID WC  . insulin glargine  40 Units Subcutaneous QAM  . warfarin  10 mg Oral ONCE-1800  . warfarin   Does not apply Once  . DISCONTD: insulin glargine  40 Units Subcutaneous Daily  . DISCONTD: insulin glargine  65 Units Subcutaneous Daily    Assessment: Heparin level therapeutic.  Day #3 of Coumadin today Goal of Therapy:  INR 2-3, Heparin level 0.3-0.7   Plan:  Continue Heparin at the same rate.  Coumadin 73m PO today.  Check AM heparin level, CBC, INR.  FCandie Mile11/07/2011,2:20 PM

## 2011-07-20 NOTE — Progress Notes (Addendum)
Subjective: Still comfortable. No complaints. Blood sugar 65 this AM. He states it is usually not this low. He is accustomed to eating a snack before bedtime. As above, he has no complaints with this low blood sugar.   No h/o of clotting in the family. He has never had a DVT. No h/o recent travel. 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   Objective: Patient Vitals for the past 24 hrs:  BP Temp Temp src Pulse Resp SpO2 Weight  07/20/11 0824 125/70 mmHg 97.8 F (36.6 C) Oral - - - -  07/20/11 0600 - - - 85  - 94 % -  07/20/11 0500 - - - 85  - 94 % -  07/20/11 0446 - 99.7 F (37.6 C) Oral - - - 134 kg (295 lb 6.7 oz)  07/20/11 0445 118/62 mmHg - - 88  - 95 % -  07/20/11 0400 - - - 86  - 93 % -  07/20/11 0300 - - - 88  - 94 % -  07/20/11 0200 - - - 90  - 96 % -  07/20/11 0100 - - - 91  - 94 % -  07/20/11 0042 - 98.4 F (36.9 C) Oral - - - -  07/20/11 0041 123/72 mmHg - - 99  - 93 % -  07/20/11 0000 - - - 87  - 93 % -  07/19/11 2310 - - - 90  - 97 % -  07/19/11 2305 - - - 90  - 96 % -  07/19/11 2300 - - - 92  - 96 % -  07/19/11 2255 - - - 91  - 96 % -  07/19/11 2250 - - - 90  - 96 % -  07/19/11 2245 - - - 90  - 95 % -  07/19/11 2240 - - - 91  - 97 % -  07/19/11 2235 - - - 93  - 96 % -  07/19/11 2230 - - - 91  - 96 % -  07/19/11 2225 - - - 88  - 96 % -  07/19/11 2220 - - - 94  - 97 % -  07/19/11 2215 - - - 99  - 96 % -  07/19/11 2210 - - - 96  - 97 % -  07/19/11 2205 - - - 91  - 97 % -  07/19/11 2200 - - - 92  - 97 % -  07/19/11 2155 - - - 91  - 96 % -  07/19/11 2150 - - - 93  - 97 % -  07/19/11 2145 - - - 92  - 97 % -  07/19/11 2140 - - - 100  - 97 % -  07/19/11 2135 - - - 95  - 96 % -  07/19/11 2130 - - - 93  - 96 % -  07/19/11 2125 - - - 99  - 97 % -  07/19/11 2120 - - - 97  - 96 % -  07/19/11 2115 - - - 90  - 95 % -  07/19/11 2040 131/64 mmHg 99 F (37.2 C) Oral 90  - 98 % -  07/19/11 2035 - - - 90  - 96 % -  07/19/11 2030 - - - 91   - 96 % -  07/19/11 2025 - - - 96  - 97 % -  07/19/11 2020 - - - 97  - 97 % -  07/19/11 2015 - - - 92  - 95 % -  07/19/11 2010 - - - 89  - 96 % -  07/19/11 2005 - - - 89  - 96 % -  07/19/11 2000 - - - 88  - 96 % -  07/19/11 1955 - - - 89  - 97 % -  07/19/11 1950 - - - 89  - 96 % -  07/19/11 1945 - - - 90  - 96 % -  07/19/11 1900 - - - 94  - 96 % -  07/19/11 1800 - - - 89  - 96 % -  07/19/11 1700 - - - 91  - 96 % -  07/19/11 1606 125/77 mmHg 98.1 F (36.7 C) Oral - - - -  07/19/11 1600 - - - 85  - 97 % -  07/19/11 1500 - - - 87  - 96 % -  07/19/11 1400 - - - 95  - 99 % -  07/19/11 1300 - - - 88  - 100 % -  07/19/11 1243 128/77 mmHg 97.3 F (36.3 C) Oral - - - -  07/19/11 1200 - - - 82  - 96 % -  07/19/11 1000 - - - 89  - 99 % -  07/19/11 0900 106/76 mmHg - - 95  18  99 % -  07/19/11 0838 113/64 mmHg 97.7 F (36.5 C) Oral - 20  - -   Weight change: 1.096 kg (2 lb 6.7 oz)  Intake/Output Summary (Last 24 hours) at 07/20/11 0834 Last data filed at 07/20/11 0827  Gross per 24 hour  Intake   5426 ml  Output   3450 ml  Net   1976 ml    Physical Exam: General appearance: alert, cooperative and no distress, Morbidly obese. Head: Normocephalic, without obvious abnormality, atraumatic Throat: lips, mucosa, and tongue normal; teeth and gums normal Neck: no adenopathy, no carotid bruit, no JVD, supple, symmetrical, trachea midline and thyroid not enlarged, symmetric, no tenderness/mass/nodules Lungs: clear to auscultation bilaterally Heart: regular rate and rhythm, S1, S2 normal, no murmur, click, rub or gallop Abdomen: soft, non-tender; bowel sounds normal; no masses,  no organomegaly Extremities: extremities normal, atraumatic, no cyanosis or edema Skin: Skin color, texture, turgor normal. No rashes or lesions  Lab Results:  [employer] 07/19/11 0849 07/18/11 2221  NA 137 136  K 5.3* 5.9*  CL 111 107  CO2 18* 19  GLUCOSE 153* 231*  BUN 37* 41*  CREATININE 2.50* 2.49*    CALCIUM 8.3* 8.7  MG -- --  PHOS -- --   No results found for this basename: AST:2,ALT:2,ALKPHOS:2,BILITOT:2,PROT:2,ALBUMIN:2 in the last 72 hours No results found for this basename: LIPASE:2,AMYLASE:2 in the last 72 hours  Basename 07/19/11 0500 07/18/11 0840  WBC 20.7* 18.4*  NEUTROABS -- --  HGB 8.9* 10.0*  HCT 26.7* 29.3*  MCV 73.2* 74.0*  PLT 302 303    Basename 07/19/11 1530 07/19/11 0849 07/18/11 2245  CKTOTAL 487* 613* 1081*  CKMB 18.0* 25.5* 62.2*  CKMBINDEX -- -- --  TROPONINI 8.38* 13.85* >25.00*   No results found for this basename: POCBNP:3 in the last 72 hours No results found for this basename: DDIMER:2 in the last 72 hours  Basename 07/18/11 1337  HGBA1C 6.1*   No results found for this basename: CHOL:2,HDL:2,LDLCALC:2,TRIG:2,CHOLHDL:2,LDLDIRECT:2 in the last 72 hours  Basename 07/18/11 1337  TSH 1.425  T4TOTAL --  T3FREE --  THYROIDAB --   No results found for this  basename: VITAMINB12:2,FOLATE:2,FERRITIN:2,TIBC:2,IRON:2,RETICCTPCT:2 in the last 72 hours  Micro Results: Recent Results (from the past 240 hour(s))  MRSA PCR SCREENING     Status: Normal   Collection Time   07/18/11  9:38 PM      Component Value Range Status Comment   MRSA by PCR NEGATIVE  NEGATIVE  Final     Studies/Results: Nm Pulmonary Per & Vent  07/18/2011  *RADIOLOGY REPORT*  Clinical Data:  Shortness of breath  NUCLEAR MEDICINE VENTILATION - PERFUSION LUNG SCAN  Technique:  Wash-in, equilibrium, and wash-out phase ventilation images were obtained using Xe-133 gas.  Perfusion images were obtained in multiple projections after intravenous injection of Tc- 17mMAA.  Radiopharmaceuticals:  10 mCi Xe-133 gas and 6 mCi Tc-974mAA.  Comparison:  None Correlation:  Chest radiograph 07/18/2011  Findings: Ventilation exam in anterior and posterior projections is normal. Perfusion lung scan in eight projections demonstrates multiple segmental and subsegmental perfusion defects in both lungs.   Chest radiograph is clear.  Findings represent a high probability for pulmonary embolism.  IMPRESSION: High probability for pulmonary embolism.  Findings called to Dr. KoWilson Singern 07/18/2011 at 1424 hours.  Original Report Authenticated By: MABurnetta SabinM.D.   Dg Chest Portable 1 View  07/18/2011  *RADIOLOGY REPORT*  Clinical Data: Shortness of breath.  PORTABLE CHEST - 1 VIEW  Comparison: None.  Findings: Heart is borderline in size.  Lungs are clear.  No effusions.  No acute bony abnormality.  IMPRESSION: Borderline heart size.  No active disease.  Original Report Authenticated By: KERaelyn NumberM.D.   ECHO 07/18/11  - Left ventricle: The cavity size was normal. Wall thickness was increased in a pattern of mild LVH. The estimated ejection fraction was 60%. Wall motion was normal; there were no regional wall motion abnormalities. - Right ventricle: RV difficult to assess fully. I can not rule outsome RV dilitation and some RV dysfunction.   Medications: Scheduled Meds:    . aspirin EC  81 mg Oral Daily  . glucose-Vitamin C      . insulin aspart  0-9 Units Subcutaneous TID WC  . insulin aspart  10 Units Subcutaneous TID WC  . insulin glargine  65 Units Subcutaneous Daily  . warfarin  10 mg Oral ONCE-1800  . warfarin   Does not apply Once  . DISCONTD: insulin aspart  0-9 Units Subcutaneous TID WC  . DISCONTD: insulin aspart  3 Units Subcutaneous TID WC   Continuous Infusions:    . heparin 1,900 Units/hr (07/20/11 0141)   PRN Meds:.acetaminophen, acetaminophen, alum & mag hydroxide-simeth, hydrALAZINE, morphine, ondansetron (ZOFRAN) IV, ondansetron  Assessment/Plan: [age]/o obese male admitted on 11/9 for shortness of breath, chest discomfort and palpitations. Noted to have elevated cardiac enzymes in ER. V/Q revealed multiple PEs.   Principal Problem:  *PE (pulmonary embolism)/ acute respiratory failure - symptoms improved -troponin elevation (>25). Troponins improving now.   - May be related to his sedentary work however he does need a hypercoagulable work up. I have ordered this. He has seen Dr ShAlen Blewn the past for his leukocytosis. I will ask that he f/u with him for results of the hypercoagulable test results.  - Cont Heparin and Coumadin. First dose on Coumadin was on 11/9. Today is Day 3.  - Lower ext doppler performed on 11/10- negative for DVT -will let him ambulate in hall today and check his pulse ox and check for symptoms. If he is stable, will transfer out of stepdown  today.   Active Problems:   SOB (shortness of breath)- as above  Cardiac enzymes elevated- as above    Hyperkalemia- due to renal failure- Diovan on hold. Rechecking labs this AM. Labs not yet resulted. Will follow.    ARF (acute renal failure) on CKD 2- due to PE and resultant reduced cardiac output. Diovan on hold. Will cont to follow.   Metabolic acidosis- likely from renal failure  Leukocytosis, unspecified- has been worked up by Dr Alen Blew and per patient no cause was found  Sickle cell trait  DM (diabetes mellitus) type II  renal manifestation- cont home meds- Lantus 65 units and Humalog (10 u TID/AC) and a low dose sliding scale. I have adjusted the sliding scale.  I will add a bedtime snack as that is what he takes at home.      HTN (hypertension)- BP low. Holding Diovan/HCTZ  Morbid Obesity  Addendum- Labs resulted. Renal markers and K+ still elevated. Will restart NS at 100cc/hr. I have looked in [facility] and cannot find baseline labs on this patient.  Cutting back on Lantus to 40 as sugars still low- please reassess the dose in AM.  Transferring out of stepdown.    LOS: 2 days   [facility] 07/20/2011, 8:34 AM

## 2011-07-20 NOTE — Progress Notes (Addendum)
CRITICAL VALUE ALERT  Critical value received:  CBG 65  Date of notification:  07/20/2011    Time of notification:  0825  Critical value read back: Yes  Nurse who received alert:  Magdalen Spatz  MD notified (1st page):  Dr. Wynelle Cleveland  Time of first page:  No page- MD seeing pt so I verbally notified  Responding MD:  Dr. Wynelle Cleveland  Time MD responded:  Dr. Wynelle Cleveland  Interventions:  Gave pt 15 GM carbohydrate snack per Hypoglycemia Protocol.  Rechecked pt's CBG at 0905 and new result was 90.

## 2011-07-20 NOTE — Progress Notes (Signed)
Report about pt gave to [place], RN who will be receiving pt to Room 2035

## 2011-07-20 NOTE — Progress Notes (Signed)
Pt refused to ambulate at this time and stated "I will in a few hours"

## 2011-07-20 NOTE — Progress Notes (Signed)
Pt transferred via wheelchair without any difficulties to Room 2035.  Nursing staff on [address] made aware of his arrival.

## 2011-07-20 NOTE — Progress Notes (Signed)
Assisted pt to ambulate in hallway.  Pt tolerated it well with no oxygen.  Pulse ox reading ranged from 95-97%.  Pt denied any SOB or difficulties.

## 2011-07-21 LAB — BASIC METABOLIC PANEL
BUN: 30 mg/dL — ABNORMAL HIGH (ref 6–23)
CO2: 18 mEq/L — ABNORMAL LOW (ref 19–32)
Calcium: 8.7 mg/dL (ref 8.4–10.5)
Chloride: 113 mEq/L — ABNORMAL HIGH (ref 96–112)
Creatinine, Ser: 2.41 mg/dL — ABNORMAL HIGH (ref 0.50–1.35)
GFR calc Af Amer: 35 mL/min — ABNORMAL LOW (ref >90)
GFR calc non Af Amer: 30 mL/min — ABNORMAL LOW (ref >90)
Glucose, Bld: 70 mg/dL (ref 70–99)
Potassium: 5.2 mEq/L — ABNORMAL HIGH (ref 3.5–5.1)
Sodium: 140 mEq/L (ref 135–145)

## 2011-07-21 LAB — CBC
HCT: 26.9 % — ABNORMAL LOW (ref 39.0–52.0)
Hemoglobin: 9.4 g/dL — ABNORMAL LOW (ref 13.0–17.0)
MCH: 25.5 pg — ABNORMAL LOW (ref 26.0–34.0)
MCHC: 34.9 g/dL (ref 30.0–36.0)
MCV: 72.9 fL — ABNORMAL LOW (ref 78.0–100.0)
Platelets: 301 10*3/uL (ref 150–400)
RBC: 3.69 MIL/uL — ABNORMAL LOW (ref 4.22–5.81)
RDW: 18.2 % — ABNORMAL HIGH (ref 11.5–15.5)
WBC: 21.5 10*3/uL — ABNORMAL HIGH (ref 4.0–10.5)

## 2011-07-21 LAB — LUPUS ANTICOAGULANT PANEL
DRVVT: 52.1 secs — ABNORMAL HIGH (ref 34.1–42.2)
Lupus Anticoagulant: DETECTED — AB
PTT Lupus Anticoagulant: 168.7 secs — ABNORMAL HIGH (ref 28.0–43.0)
PTTLA 4:1 Mix: 140.1 secs — ABNORMAL HIGH (ref 28.0–43.0)
PTTLA Confirmation: 8.3 secs — ABNORMAL HIGH (ref <8.0)
dRVVT Incubated 1:1 Mix: 37.6 secs (ref 34.1–42.2)

## 2011-07-21 LAB — GLUCOSE, CAPILLARY
Glucose-Capillary: 112 mg/dL — ABNORMAL HIGH (ref 70–99)
Glucose-Capillary: 60 mg/dL — ABNORMAL LOW (ref 70–99)
Glucose-Capillary: 113 mg/dL — ABNORMAL HIGH (ref 70–99)

## 2011-07-21 LAB — PROTEIN C ACTIVITY: Protein C Activity: 95 % (ref 75–133)

## 2011-07-21 LAB — HEPARIN LEVEL (UNFRACTIONATED): Heparin Unfractionated: 0.69 IU/mL (ref 0.30–0.70)

## 2011-07-21 LAB — PROTIME-INR
INR: 1.32 (ref 0.00–1.49)
Prothrombin Time: 16.6 seconds — ABNORMAL HIGH (ref 11.6–15.2)

## 2011-07-21 LAB — PROTEIN S ACTIVITY: Protein S Activity: 61 % — ABNORMAL LOW (ref 69–129)

## 2011-07-21 LAB — PROTEIN C, TOTAL: Protein C, Total: 83 % (ref 72–160)

## 2011-07-21 LAB — PROTEIN S, TOTAL: Protein S Ag, Total: 110 % (ref 60–150)

## 2011-07-21 MED ORDER — ENOXAPARIN SODIUM 150 MG/ML ~~LOC~~ SOLN
1.0000 mg/kg | Freq: Two times a day (BID) | SUBCUTANEOUS | Status: DC
  Administered 2011-07-21: 135 mg via SUBCUTANEOUS
  Administered 2011-07-22: 10:00:00 via SUBCUTANEOUS
  Filled 2011-07-21 (×5): qty 1

## 2011-07-21 MED ORDER — INSULIN GLARGINE 100 UNIT/ML ~~LOC~~ SOLN
30.0000 [IU] | SUBCUTANEOUS | Status: DC
  Administered 2011-07-22: 30 [IU] via SUBCUTANEOUS
  Filled 2011-07-21: qty 3

## 2011-07-21 MED ORDER — SODIUM BICARBONATE 650 MG PO TABS
650.0000 mg | ORAL_TABLET | Freq: Three times a day (TID) | ORAL | Status: DC
  Filled 2011-07-21 (×4): qty 1

## 2011-07-21 MED ORDER — ENOXAPARIN (LOVENOX) PATIENT EDUCATION KIT
PACK | Freq: Once | Status: AC
  Administered 2011-07-21: 19:00:00
  Filled 2011-07-21 (×2): qty 1

## 2011-07-21 MED ORDER — HEPARIN (PORCINE) IN NACL 100-0.45 UNIT/ML-% IJ SOLN
1800.0000 [IU]/h | INTRAMUSCULAR | Status: DC
  Filled 2011-07-21 (×10): qty 250

## 2011-07-21 MED ORDER — WARFARIN SODIUM 6 MG PO TABS
12.0000 mg | ORAL_TABLET | Freq: Once | ORAL | Status: AC
  Administered 2011-07-21: 12 mg via ORAL
  Filled 2011-07-21: qty 2

## 2011-07-21 NOTE — Progress Notes (Signed)
ANTICOAGULATION CONSULT NOTE - Follow Up Consult  Pharmacy Consult for coumadin/Lovenox  Indication: pulmonary embolus  Allergies  Allergen Reactions  . Codeine Rash    Patient Measurements: Height: 5' 10"  (177.8 cm) Weight: 295 lb 6.7 oz (134 kg) IBW/kg (Calculated) : 73    Vital Signs: Temp: 98 F (36.7 C) (11/12 1400) Temp src: Oral (11/12 1400) BP: 125/75 mmHg (11/12 1400) Pulse Rate: 85  (11/12 1400)  Labs:  Basename 07/21/11 0552 07/20/11 0500 07/19/11 1530 07/19/11 1227 07/19/11 0849 07/19/11 0500 07/18/11 2245  HGB 9.4* 9.2* -- -- -- -- --  HCT 26.9* 26.5* -- -- -- 26.7* --  PLT 301 274 -- -- -- 302 --  APTT -- -- -- -- -- -- --  LABPROT 16.6* 14.8 -- -- -- 14.9 --  INR 1.32 1.14 -- -- -- 1.15 --  HEPARINUNFRC 0.69 0.55 -- 0.57 -- -- --  CREATININE 2.41* 2.33* -- -- 2.50* -- --  CKTOTAL -- -- 487* -- 613* -- 1081*  CKMB -- -- 18.0* -- 25.5* -- 62.2*  TROPONINI -- -- 8.38* -- 13.85* -- >25.00*   Estimated Creatinine Clearance: 52.2 ml/min (by C-G formula based on Cr of 2.41).   Medications:  Scheduled:     . aspirin EC  81 mg Oral Daily  . enoxaparin (LOVENOX) injection  1 mg/kg Subcutaneous Q12H  . enoxaparin   Does not apply Once  . insulin aspart  0-9 Units Subcutaneous TID WC  . insulin aspart  10 Units Subcutaneous TID WC  . insulin glargine  30 Units Subcutaneous QAM  . sodium bicarbonate  650 mg Oral TID  . warfarin  12 mg Oral ONCE-1800  . warfarin   Does not apply Once  . DISCONTD: insulin glargine  40 Units Subcutaneous QAM    Assessment: 25 yom admitted with acute PE. Day #4 VTE overlap.  INR trending up but remains subtherapeutic.  No bleeding noted. Will d/c heparin and start Lovenox for patient to go home with Lovenox bridge until INR > 2 x 48hr and 5 day vte overlap    Goal of Therapy:  INR 2-3   Plan:  1. Lovenox 35m/kg = [id number]  CuNadine Counts1/08/2011,7:12 PM

## 2011-07-21 NOTE — Progress Notes (Signed)
Subjective: No events overnight.  No chest pain or shortness of breath  Objective:  Vital signs in last 24 hours:  Filed Vitals:   07/20/11 1859 07/20/11 2025 07/21/11 0447 07/21/11 1400  BP: 162/97 127/81 119/78 125/75  Pulse: 91 86 71 85  Temp: 97.9 F (36.6 C) 98.1 F (36.7 C) 98.5 F (36.9 C) 98 F (36.7 C)  TempSrc: Oral Oral Oral Oral  Resp: 20 20 20 18   Height:      Weight:      SpO2: 96% 97% 98% 97%    Intake/Output from previous day:   Intake/Output Summary (Last 24 hours) at 07/21/11 1826 Last data filed at 07/21/11 1600  Gross per 24 hour  Intake   1028 ml  Output    800 ml  Net    228 ml    Physical Exam: General: Alert, awake, oriented x3, in no acute distress. HEENT: No bruits, no goiter. Heart: Regular rate and rhythm, without murmurs, rubs, gallops. Lungs: Clear to auscultation bilaterally. Abdomen: Soft, nontender, nondistended, positive bowel sounds. Extremities: No clubbing cyanosis or edema with positive pedal pulses. Neuro: Grossly intact, nonfocal.   Lab Results:  Basic Metabolic Panel:    Component Value Date/Time   NA 140 07/21/2011 0552   K 5.2* 07/21/2011 0552   CL 113* 07/21/2011 0552   CO2 18* 07/21/2011 0552   BUN 30* 07/21/2011 0552   CREATININE 2.41* 07/21/2011 0552   GLUCOSE 70 07/21/2011 0552   CALCIUM 8.7 07/21/2011 0552   CBC:    Component Value Date/Time   WBC 21.5* 07/21/2011 0552   HGB 9.4* 07/21/2011 0552   HCT 26.9* 07/21/2011 0552   PLT 301 07/21/2011 0552   MCV 72.9* 07/21/2011 0552    Recent Results (from the past 240 hour(s))  MRSA PCR SCREENING     Status: Normal   Collection Time   07/18/11  9:38 PM      Component Value Range Status Comment   MRSA by PCR NEGATIVE  NEGATIVE  Final     Studies/Results: No results found.  Medications: Scheduled Meds:   . aspirin EC  81 mg Oral Daily  . insulin aspart  0-9 Units Subcutaneous TID WC  . insulin aspart  10 Units Subcutaneous TID WC  . insulin  glargine  40 Units Subcutaneous QAM  . warfarin  10 mg Oral ONCE-1800  . warfarin  12 mg Oral ONCE-1800  . warfarin   Does not apply Once   Continuous Infusions:   . sodium chloride 10 mL/hr at 07/21/11 0600  . heparin 1,800 Units/hr (07/21/11 1500)  . DISCONTD: heparin 1,900 Units/hr (07/21/11 1610)   PRN Meds:.acetaminophen, acetaminophen, alum & mag hydroxide-simeth, hydrALAZINE, morphine, ondansetron (ZOFRAN) IV, ondansetron  Assessment/Plan:  Principal Problem:  *PE (pulmonary embolism) Active Problems:  SOB (shortness of breath)  Hyperkalemia  ARF (acute renal failure)  Leukocytosis, unspecified  DM (diabetes mellitus) type II controlled with renal manifestation  Cardiac enzymes elevated  Metabolic acidosis  HTN (hypertension)  Sickle cell trait  Morbid obesity  Plan:   Will change heparin gtt to lovenox Start oral bicarb D/c IV Repeat labs in am Possible d/c home in am   LOS: 3 days   Terria Deschepper 07/21/2011, 6:26 PM

## 2011-07-21 NOTE — Progress Notes (Signed)
Patient lives with spouse, patient is being anticoagulated.  NCM will continue to follow for discharge needs.

## 2011-07-21 NOTE — Progress Notes (Signed)
UR review completed

## 2011-07-21 NOTE — Progress Notes (Signed)
ANTICOAGULATION CONSULT NOTE - Follow Up Consult  Pharmacy Consult for coumadin/heparin Indication: pulmonary embolus  Allergies  Allergen Reactions  . Codeine Rash    Patient Measurements: Height: 5' 10"  (177.8 cm) Weight: 295 lb 6.7 oz (134 kg) IBW/kg (Calculated) : 73    Vital Signs: Temp: 98.5 F (36.9 C) (11/12 0447) Temp src: Oral (11/12 0447) BP: 119/78 mmHg (11/12 0447) Pulse Rate: 71  (11/12 0447)  Labs:  Basename 07/21/11 0552 07/20/11 0500 07/19/11 1530 07/19/11 1227 07/19/11 0849 07/19/11 0500 07/18/11 2245  HGB 9.4* 9.2* -- -- -- -- --  HCT 26.9* 26.5* -- -- -- 26.7* --  PLT 301 274 -- -- -- 302 --  APTT -- -- -- -- -- -- --  LABPROT 16.6* 14.8 -- -- -- 14.9 --  INR 1.32 1.14 -- -- -- 1.15 --  HEPARINUNFRC 0.69 0.55 -- 0.57 -- -- --  CREATININE 2.41* 2.33* -- -- 2.50* -- --  CKTOTAL -- -- 487* -- 613* -- 1081*  CKMB -- -- 18.0* -- 25.5* -- 62.2*  TROPONINI -- -- 8.38* -- 13.85* -- >25.00*   Estimated Creatinine Clearance: 52.2 ml/min (by C-G formula based on Cr of 2.41).   Medications:  Scheduled:    . aspirin EC  81 mg Oral Daily  . insulin aspart  0-9 Units Subcutaneous TID WC  . insulin aspart  10 Units Subcutaneous TID WC  . insulin glargine  40 Units Subcutaneous QAM  . warfarin  10 mg Oral ONCE-1800  . warfarin   Does not apply Once    Assessment: Isaiah [name] admitted with acute PE. Day #4 VTE overlap.  INR trending up but remains subtherapeutic. Heparin level at upper limit desired range. No bleeding noted.   Goal of Therapy:  INR 2-3 Heparin level 0.3-0.7 units/ml   Plan:  1. As heparin is at upper limit range, decrease rate to 1800 units/hr and follow-up with labs in am 2. Coumadin 57m PO x 1 tonight. Need to complete at least 5 day overlap heparin/coumadin for acute VTE and preferably overlap 2 days once INR >=2.   ZClovis Riley11/08/2011,1:53 PM

## 2011-07-22 LAB — BASIC METABOLIC PANEL
BUN: 29 mg/dL — ABNORMAL HIGH (ref 6–23)
CO2: 19 mEq/L (ref 19–32)
Calcium: 9.1 mg/dL (ref 8.4–10.5)
Chloride: 110 mEq/L (ref 96–112)
Creatinine, Ser: 2.23 mg/dL — ABNORMAL HIGH (ref 0.50–1.35)
GFR calc Af Amer: 39 mL/min — ABNORMAL LOW (ref >90)
GFR calc non Af Amer: 33 mL/min — ABNORMAL LOW (ref >90)
Glucose, Bld: 125 mg/dL — ABNORMAL HIGH (ref 70–99)
Potassium: 5.3 mEq/L — ABNORMAL HIGH (ref 3.5–5.1)
Sodium: 138 mEq/L (ref 135–145)

## 2011-07-22 LAB — CARDIOLIPIN ANTIBODIES, IGG, IGM, IGA
Anticardiolipin IgA: 2 APL U/mL — ABNORMAL LOW (ref <22)
Anticardiolipin IgG: 6 GPL U/mL — ABNORMAL LOW (ref <23)
Anticardiolipin IgM: 1 MPL U/mL — ABNORMAL LOW (ref <11)

## 2011-07-22 LAB — PROTIME-INR
INR: 1.57 — ABNORMAL HIGH (ref 0.00–1.49)
Prothrombin Time: 19.1 seconds — ABNORMAL HIGH (ref 11.6–15.2)

## 2011-07-22 LAB — CBC
HCT: 29.3 % — ABNORMAL LOW (ref 39.0–52.0)
Hemoglobin: 10 g/dL — ABNORMAL LOW (ref 13.0–17.0)
MCH: 25.2 pg — ABNORMAL LOW (ref 26.0–34.0)
MCHC: 34.1 g/dL (ref 30.0–36.0)
MCV: 73.8 fL — ABNORMAL LOW (ref 78.0–100.0)
Platelets: 329 10*3/uL (ref 150–400)
RBC: 3.97 MIL/uL — ABNORMAL LOW (ref 4.22–5.81)
RDW: 18.6 % — ABNORMAL HIGH (ref 11.5–15.5)
WBC: 22.9 10*3/uL — ABNORMAL HIGH (ref 4.0–10.5)

## 2011-07-22 LAB — GLUCOSE, CAPILLARY
Glucose-Capillary: 102 mg/dL — ABNORMAL HIGH (ref 70–99)
Glucose-Capillary: 65 mg/dL — ABNORMAL LOW (ref 70–99)
Glucose-Capillary: 134 mg/dL — ABNORMAL HIGH (ref 70–99)

## 2011-07-22 LAB — FACTOR 5 LEIDEN

## 2011-07-22 LAB — BETA-2-GLYCOPROTEIN I ABS, IGG/M/A
Beta-2 Glyco I IgG: 0 G Units (ref <20)
Beta-2-Glycoprotein I IgA: 4 A Units (ref <20)
Beta-2-Glycoprotein I IgM: 0 M Units (ref <20)

## 2011-07-22 LAB — HEPARIN LEVEL (UNFRACTIONATED): Heparin Unfractionated: 0.6 IU/mL (ref 0.30–0.70)

## 2011-07-22 MED ORDER — INSULIN GLARGINE 100 UNIT/ML ~~LOC~~ SOLN: 35.0000 [IU] | Freq: Every day | SUBCUTANEOUS | Status: DC

## 2011-07-22 MED ORDER — WARFARIN SODIUM 6 MG PO TABS: 12.0000 mg | ORAL_TABLET | Freq: Once | ORAL | Status: DC

## 2011-07-22 MED ORDER — WARFARIN SODIUM 6 MG PO TABS
12.0000 mg | ORAL_TABLET | Freq: Once | ORAL | Status: DC
  Filled 2011-07-22: qty 2

## 2011-07-22 MED ORDER — ENOXAPARIN SODIUM 150 MG/ML ~~LOC~~ SOLN
135.0000 mg | Freq: Two times a day (BID) | SUBCUTANEOUS | Status: DC
  Filled 2011-07-22 (×2): qty 1

## 2011-07-22 MED ORDER — SODIUM BICARBONATE 650 MG PO TABS: 650.0000 mg | ORAL_TABLET | Freq: Two times a day (BID) | ORAL | Status: DC

## 2011-07-22 MED ORDER — ENOXAPARIN SODIUM 150 MG/ML ~~LOC~~ SOLN: 135.0000 mg | Freq: Two times a day (BID) | SUBCUTANEOUS | Status: DC

## 2011-07-22 MED ORDER — WARFARIN SODIUM 10 MG PO TABS
12.5000 mg | ORAL_TABLET | Freq: Once | ORAL | Status: DC
  Filled 2011-07-22: qty 1

## 2011-07-22 NOTE — Progress Notes (Signed)
07/22/2011  3:39 PM Discharged home accompanied by mother.

## 2011-07-22 NOTE — Plan of Care (Cosign Needed)
Problem: Phase II Progression Outcomes Goal: Other Phase II Outcomes/Goals Outcome: Completed/Met Date Met:  07/22/11 lovenox injection reviewed, lovenox starter kit received

## 2011-07-22 NOTE — Progress Notes (Signed)
PE on heparin (now LMWH) and Coumadin overlap day #5. Heparin changed to LMWH last pm in preparation for discharge. Coumadin not yet therapeutic with INR 2-3 (INR 1.57 today).  Plan:  Continue Lovenox 125m/12h (with CrCl>30) Repeat Coumadin 176magain today.

## 2011-07-22 NOTE — Discharge Summary (Signed)
Patient Isaiah [name] MRN: [id number] DOB/AGE: 01/05/1966 45 y.o.  Admit date: 07/18/2011 Discharge date: 07/22/2011  Primary Care Physician:  Lynne Logan, MD at [facility].  Discharge Diagnoses:    .PE (pulmonary embolism) .SOB (shortness of breath) .Hyperkalemia .ARF (acute renal failure) on CKD 4 .Leukocytosis, unspecified .Sickle cell-thalassemia disease .DM (diabetes mellitus) type II controlled with renal manifestation .Cardiac enzymes elevated .Metabolic acidosis .HTN (hypertension)   Current Discharge Medication List    START taking these medications   Details  enoxaparin (LOVENOX) 150 MG/ML injection Inject 0.9 mLs (135 mg total) into the skin every 12 (twelve) hours. Qty: 10 mL, Refills: 0    sodium bicarbonate 650 MG tablet Take 1 tablet (650 mg total) by mouth 2 (two) times daily. Qty: 60 tablet, Refills: 0    warfarin (COUMADIN) 6 MG tablet Take 2 tablets (12 mg total) by mouth one time only at 6 PM. Qty: 30 tablet, Refills: 0      CONTINUE these medications which have CHANGED   Details  insulin glargine (LANTUS) 100 UNIT/ML injection Inject 35 Units into the skin daily. Qty: 10 mL, Refills: 0      CONTINUE these medications which have NOT CHANGED   Details  aspirin 81 MG tablet Take 81 mg by mouth daily.      insulin lispro (HUMALOG) 100 UNIT/ML injection Inject 10 Units into the skin 3 (three) times daily before meals.        STOP taking these medications     valsartan-hydrochlorothiazide (DIOVAN-HCT) 320-25 MG per tablet         Disposition and Follow-up: Follow up with Dr. [name] for INR check on Thursday/friday (Patient will arrange), Follow up with Dr. Moshe Cipro next week (patient to schedule), Follow up with Dr. Alen Blew as scheduled.  Consults:    Significant Diagnostic Studies:  Nm Pulmonary Per & Vent  07/18/2011  *RADIOLOGY REPORT*  Clinical Data:  Shortness of breath  NUCLEAR MEDICINE VENTILATION - PERFUSION LUNG SCAN   Technique:  Wash-in, equilibrium, and wash-out phase ventilation images were obtained using Xe-133 gas.  Perfusion images were obtained in multiple projections after intravenous injection of Tc- 63mMAA.  Radiopharmaceuticals:  10 mCi Xe-133 gas and 6 mCi Tc-973mAA.  Comparison:  None Correlation:  Chest radiograph 07/18/2011  Findings: Ventilation exam in anterior and posterior projections is normal. Perfusion lung scan in eight projections demonstrates multiple segmental and subsegmental perfusion defects in both lungs.  Chest radiograph is clear.  Findings represent a high probability for pulmonary embolism.  IMPRESSION: High probability for pulmonary embolism.  Findings called to Dr. KoWilson Singern 07/18/2011 at 1424 hours.  Original Report Authenticated By: MABurnetta SabinM.D.   Dg Chest Portable 1 View  07/18/2011  *RADIOLOGY REPORT*  Clinical Data: Shortness of breath.  PORTABLE CHEST - 1 VIEW  Comparison: None.  Findings: Heart is borderline in size.  Lungs are clear.  No effusions.  No acute bony abnormality.  IMPRESSION: Borderline heart size.  No active disease.  Original Report Authenticated By: KERaelyn NumberM.D.    Brief H and P: For complete details please refer to admission H and P, but in brief [age]ear old male with a past medical history significant for hypertension, diabetes, sickle cell thalassemia, and chronic kidney disease (Stage 1-2). Who came into the hospital secondary to increase shortness of breath, palpitations and chest discomfort. Patient denies any fever, any chills, no abdominal pain, no nausea/vomiting or any other acute complaints. Patient was found to have elevated  cardiac enzymes, cardiology was initially consulted, and base the patient history and presentation was positive for high concerns PE; at that moment a bedside 2-D echo was done and demonstrated right ventricle strain. Also with high probability V-Q scan for PE. [facility] hospitalist was called to admit the patient and  provide further evaluation and treatment.    Physical Exam on Discharge:  Filed Vitals:   07/21/11 1400 07/21/11 2029 07/22/11 0539 07/22/11 1300  BP: 125/75 150/84 119/77 134/87  Pulse: 85 91 87 78  Temp: 98 F (36.7 C) 98 F (36.7 C) 98.8 F (37.1 C) 98.5 F (36.9 C)  TempSrc: Oral Oral Oral Oral  Resp: 18 18 20 20   Height:      Weight:      SpO2: 97% 98% 97% 97%     Intake/Output Summary (Last 24 hours) at 07/22/11 1431 Last data filed at 07/22/11 1300  Gross per 24 hour  Intake   1200 ml  Output   1950 ml  Net   -750 ml    General: Alert, awake, oriented x3, in no acute distress. HEENT: No bruits, no goiter. Heart: Regular rate and rhythm, without murmurs, rubs, gallops. Lungs: Clear to auscultation bilaterally. Abdomen: Soft, nontender, nondistended, positive bowel sounds. Extremities: No clubbing cyanosis or edema with positive pedal pulses. Neuro: Grossly intact, nonfocal.  CBC:    Component Value Date/Time   WBC 22.9* 07/22/2011 0648   HGB 10.0* 07/22/2011 0648   HCT 29.3* 07/22/2011 0648   PLT 329 07/22/2011 0648   MCV 73.8* 07/22/2011 2111    Basic Metabolic Panel:    Component Value Date/Time   NA 138 07/22/2011 0648   K 5.3* 07/22/2011 0648   CL 110 07/22/2011 0648   CO2 19 07/22/2011 0648   BUN 29* 07/22/2011 0648   CREATININE 2.23* 07/22/2011 0648   GLUCOSE 125* 07/22/2011 0648   CALCIUM 9.1 07/22/2011 0648    Hospital Course:    *PE (pulmonary embolism):  Patient was admitted to the hospital with shortness of breath and chest pain.  His cardiac enzymes were elevated and further work up revealed b/l PE.  He was started on anticoagulation and has since improved.  He did have a hypercoaguable panel sent which did have some positive values, but these were likely false positives in setting of receiving heparin.  This will need to be repeat at some point down the drown, patient is followed by a hematologist for his chronic leukocytosis.  He has  been placed on lovenox and coumadin and will d/c home today.  He will need repeat INR in 2-3 days with his primary doctor.   Hyperkalemia: Patient was offered kayexelate and refused.  He wishes to discuss this with his primary care doctor tomorrow when he follows up with her, and if still needed, he will take it then.   ARF (acute renal failure), on CKD, patient likely has element of CKD.  He has seen Dr. Moshe Cipro in the past, and is advised to see her again next week in follow up.   Leukocytosis, unspecified, follow up with Dr, Alen Blew   DM (diabetes mellitus) type II controlled with renal manifestation,    Metabolic acidosis, likely due to renal failure, asymptomatic, started on oral bicarb supplements  HTN (hypertension)  Sickle cell trait  Morbid obesity   Time spent on Discharge: 78mns  Signed: MEMON,JEHANZEB 07/22/2011, 2:31 PM

## 2011-07-22 NOTE — Progress Notes (Signed)
Spoke with patient, he will be discharged today, no needs identified.

## 2011-07-22 NOTE — Progress Notes (Signed)
Patient's PCP is Isaiah [name] at [facility] in [place].

## 2012-01-09 ENCOUNTER — Other Ambulatory Visit: Payer: 59 | Admitting: Lab

## 2012-01-16 ENCOUNTER — Telehealth: Payer: Self-pay | Admitting: Oncology

## 2012-01-16 ENCOUNTER — Ambulatory Visit (HOSPITAL_BASED_OUTPATIENT_CLINIC_OR_DEPARTMENT_OTHER): Payer: 59 | Admitting: Oncology

## 2012-01-16 ENCOUNTER — Ambulatory Visit (HOSPITAL_BASED_OUTPATIENT_CLINIC_OR_DEPARTMENT_OTHER): Payer: 59 | Admitting: Lab

## 2012-01-16 VITALS — BP 142/91 | HR 93 | Temp 97.2°F | Ht 70.0 in | Wt 296.4 lb

## 2012-01-16 DIAGNOSIS — D649 Anemia, unspecified: Secondary | ICD-10-CM

## 2012-01-16 DIAGNOSIS — D729 Disorder of white blood cells, unspecified: Secondary | ICD-10-CM

## 2012-01-16 DIAGNOSIS — D472 Monoclonal gammopathy: Secondary | ICD-10-CM

## 2012-01-16 DIAGNOSIS — D7289 Other specified disorders of white blood cells: Secondary | ICD-10-CM

## 2012-01-16 LAB — CBC WITH DIFFERENTIAL/PLATELET
BASO%: 1 % (ref 0.0–2.0)
Basophils Absolute: 0.1 10*3/uL (ref 0.0–0.1)
EOS%: 1.5 % (ref 0.0–7.0)
Eosinophils Absolute: 0.2 10*3/uL (ref 0.0–0.5)
HCT: 29.7 % — ABNORMAL LOW (ref 38.4–49.9)
HGB: 10.6 g/dL — ABNORMAL LOW (ref 13.0–17.1)
LYMPH%: 17.8 % (ref 14.0–49.0)
MCH: 26.4 pg — ABNORMAL LOW (ref 27.2–33.4)
MCHC: 35.7 g/dL (ref 32.0–36.0)
MCV: 73.9 fL — ABNORMAL LOW (ref 79.3–98.0)
MONO#: 1.2 10*3/uL — ABNORMAL HIGH (ref 0.1–0.9)
MONO%: 9.7 % (ref 0.0–14.0)
NEUT#: 8.7 10*3/uL — ABNORMAL HIGH (ref 1.5–6.5)
NEUT%: 70 % (ref 39.0–75.0)
Platelets: 354 10*3/uL (ref 140–400)
RBC: 4.02 10*6/uL — ABNORMAL LOW (ref 4.20–5.82)
RDW: 17.4 % — ABNORMAL HIGH (ref 11.0–14.6)
WBC: 12.4 10*3/uL — ABNORMAL HIGH (ref 4.0–10.3)
lymph#: 2.2 10*3/uL (ref 0.9–3.3)
nRBC: 2 % — ABNORMAL HIGH (ref 0–0)

## 2012-01-16 NOTE — Progress Notes (Signed)
Hematology and Oncology Follow Up Visit  [facility] [id number] 06/29/1966 46 y.o. 01/16/2012 10:57 AM    Principle Diagnosis: This is a 46 year old gentleman with the following  diagnoses:  1. Monoclonal protein in the form of IgG kappa, likely represents monoclonal gammopathy, undetermined significance without enough  criteria to suggest multiple myeloma.  2. Microcytic anemia as a part of a hemoglobinopathy, most likely thalassemia.  Interim History:  Isaiah [name] presents today for a followup visit. He is a gentleman whom I saw initially back in March 2012 for  evaluation of IgG kappa monoclonal protein. At that time, his M-spike was around 1.1 g/dL. He had an IgG level of 2,780. He did not have any  lytic bony lesions. He did not have severe plasmacytosis. He had a slight increase in his plasma cells about 7%, but the pattern did not  indicate a monotonous infiltration of plasma cells more than the intermittent scattered clusters, again, which suggest against multiple  myeloma at least for the time being. Clinically, Isaiah [name] is asymptomatic. He does have renal insufficiency, which could be related  to his longstanding hypertension. It could be also related to his plasma cell disorder. Since his recent diagnosis back in March, we have  elected to put him on surveillance and repeat his serum protein electrophoresis, protein studies for which he is here to discuss them  today. Clinically, Isaiah [name] is asymptomatic. He did not report any chest pain. He did not report any back pain. He did not report any  opportunistic infection. He has not had any hospitalization or illnesses. Overall, performance status activity level remains unchanged  at this time.  As mentioned, he had no complications, fatigue, or tiredness He was diagnosed with PE in 07/2011 and currently on warfarin.    Medications: I have reviewed the patient's current medications. Current outpatient prescriptions:aspirin 81 MG  tablet, Take 81 mg by mouth daily.  , Disp: , Rfl: ;  insulin glargine (LANTUS) 100 UNIT/ML injection, Inject 35 Units into the skin daily., Disp: 10 mL, Rfl: 0;  insulin lispro (HUMALOG) 100 UNIT/ML injection, Inject 10 Units into the skin 3 (three) times daily before meals.  , Disp: , Rfl:  sodium bicarbonate 650 MG tablet, Take 1 tablet (650 mg total) by mouth 2 (two) times daily., Disp: 60 tablet, Rfl: 0;  warfarin (COUMADIN) 6 MG tablet, Take 2 tablets (12 mg total) by mouth one time only at 6 PM., Disp: 30 tablet, Rfl: 0  Allergies:  Allergies  Allergen Reactions  . Codeine Rash    Past Medical History, Surgical history, Social history, and Family History were reviewed and updated.  Review of Systems: Constitutional:  Negative for fever, chills, night sweats, anorexia, weight loss, pain. Cardiovascular: no chest pain or dyspnea on exertion Respiratory: negative Neurological: negative Dermatological: negative ENT: negative Skin: Negative. Gastrointestinal: negative Genito-Urinary: negative Hematological and Lymphatic: negative Breast: negative Musculoskeletal: negative Remaining ROS negative. Physical Exam: Blood pressure 142/91, pulse 93, temperature 97.2 F (36.2 C), temperature source Oral, height 5' 10"  (1.778 m), weight 296 lb 6.4 oz (134.446 kg). ECOG: 1 General appearance: alert Head: Normocephalic, without obvious abnormality, atraumatic Neck: no adenopathy, no carotid bruit, no JVD, supple, symmetrical, trachea midline and thyroid not enlarged, symmetric, no tenderness/mass/nodules Lymph nodes: Cervical, supraclavicular, and axillary nodes normal. Heart:regular rate and rhythm, S1, S2 normal, no murmur, click, rub or gallop Lung:chest clear, no wheezing, rales, normal symmetric air entry Abdomin: soft, non-tender, without masses or organomegaly EXT:no erythema, induration, or  nodules   Lab Results: Lab Results  Component Value Date   WBC 22.9* 07/22/2011   HGB  10.0* 07/22/2011   HCT 29.3* 07/22/2011   MCV 73.8* 07/22/2011   PLT 329 07/22/2011     Chemistry      Component Value Date/Time   NA 138 07/22/2011 0648   K 5.3* 07/22/2011 0648   CL 110 07/22/2011 0648   CO2 19 07/22/2011 0648   BUN 29* 07/22/2011 0648   CREATININE 2.23* 07/22/2011 0648      Component Value Date/Time   CALCIUM 9.1 07/22/2011 0648   ALKPHOS 115 07/04/2011 1139   ALKPHOS 115 07/04/2011 1139   ALKPHOS 115 07/04/2011 1139   ALKPHOS 115 07/04/2011 1139   ALKPHOS 115 07/04/2011 1139   ALKPHOS 115 07/04/2011 1139   ALKPHOS 115 07/04/2011 1139   ALKPHOS 115 07/04/2011 1139   AST 13 07/04/2011 1139   AST 13 07/04/2011 1139   AST 13 07/04/2011 1139   AST 13 07/04/2011 1139   AST 13 07/04/2011 1139   AST 13 07/04/2011 1139   AST 13 07/04/2011 1139   AST 13 07/04/2011 1139   ALT 11 07/04/2011 1139   ALT 11 07/04/2011 1139   ALT 11 07/04/2011 1139   ALT 11 07/04/2011 1139   ALT 11 07/04/2011 1139   ALT 11 07/04/2011 1139   ALT 11 07/04/2011 1139   ALT 11 07/04/2011 1139   BILITOT 0.9 07/04/2011 1139   BILITOT 0.9 07/04/2011 1139   BILITOT 0.9 07/04/2011 1139   BILITOT 0.9 07/04/2011 1139   BILITOT 0.9 07/04/2011 1139   BILITOT 0.9 07/04/2011 1139   BILITOT 0.9 07/04/2011 1139   BILITOT 0.9 07/04/2011 1139          Impression and Plan: This is a 46 year old gentleman with the following  issues:  1. Monoclonal gammopathy, IgG subtype. Again, represents likely monoclonal gammopathy of undetermined significance. Again, really  no clear-cut evidence to suggest end-organ damage. No lytic bony lesions. His bone marrow biopsy did not suggest the diagnosis of multiple myeloma, again, due to the really scattered nature of his plasma cells. However, he is at a high risk of developing multiple  myeloma and it is very important to keep close observation. I will remeasure his protein studies in 6 months.   2. Anemia, which is multifactorial and definitely has an  element of hemoglobinopathy. Again, the etiology is probably a combination of  sickle cell with thalassemia combination at this point. Typically, his hemoglobin is relatively under reasonable control. It is not  severely low, so really no intervention is needed at this time.     [facility], MD 5/10/201310:57 AM

## 2012-01-16 NOTE — Telephone Encounter (Signed)
Gave appt calendar for November 2013 lab and MD, pt sent to labs today

## 2012-01-20 LAB — COMPREHENSIVE METABOLIC PANEL
ALT: 11 U/L (ref 0–53)
AST: 14 U/L (ref 0–37)
Albumin: 3.7 g/dL (ref 3.5–5.2)
Alkaline Phosphatase: 113 U/L (ref 39–117)
BUN: 24 mg/dL — ABNORMAL HIGH (ref 6–23)
CO2: 21 mEq/L (ref 19–32)
Calcium: 8 mg/dL — ABNORMAL LOW (ref 8.4–10.5)
Chloride: 111 mEq/L (ref 96–112)
Creatinine, Ser: 1.8 mg/dL — ABNORMAL HIGH (ref 0.50–1.35)
Glucose, Bld: 194 mg/dL — ABNORMAL HIGH (ref 70–99)
Potassium: 4.9 mEq/L (ref 3.5–5.3)
Sodium: 139 mEq/L (ref 135–145)
Total Bilirubin: 0.9 mg/dL (ref 0.3–1.2)
Total Protein: 7.6 g/dL (ref 6.0–8.3)

## 2012-01-20 LAB — SPEP & IFE WITH QIG
Albumin ELP: 48 % — ABNORMAL LOW (ref 55.8–66.1)
Alpha-1-Globulin: 4.8 % (ref 2.9–4.9)
Alpha-2-Globulin: 8.9 % (ref 7.1–11.8)
Beta 2: 4.2 % (ref 3.2–6.5)
Beta Globulin: 5.2 % (ref 4.7–7.2)
Gamma Globulin: 28.9 % — ABNORMAL HIGH (ref 11.1–18.8)
IgA: 147 mg/dL (ref 68–379)
IgG (Immunoglobin G), Serum: 2630 mg/dL — ABNORMAL HIGH (ref 650–1600)
IgM, Serum: 49 mg/dL (ref 41–251)
M-Spike, %: 1.16 g/dL
Total Protein, Serum Electrophoresis: 7.6 g/dL (ref 6.0–8.3)

## 2012-01-20 LAB — KAPPA/LAMBDA LIGHT CHAINS
Kappa free light chain: 8.09 mg/dL — ABNORMAL HIGH (ref 0.33–1.94)
Kappa:Lambda Ratio: 2.68 — ABNORMAL HIGH (ref 0.26–1.65)
Lambda Free Lght Chn: 3.02 mg/dL — ABNORMAL HIGH (ref 0.57–2.63)

## 2012-01-28 ENCOUNTER — Encounter (INDEPENDENT_AMBULATORY_CARE_PROVIDER_SITE_OTHER): Payer: 59 | Admitting: Ophthalmology

## 2012-01-28 DIAGNOSIS — H4310 Vitreous hemorrhage, unspecified eye: Secondary | ICD-10-CM

## 2012-01-28 DIAGNOSIS — E1039 Type 1 diabetes mellitus with other diabetic ophthalmic complication: Secondary | ICD-10-CM

## 2012-01-28 DIAGNOSIS — E1065 Type 1 diabetes mellitus with hyperglycemia: Secondary | ICD-10-CM

## 2012-01-28 DIAGNOSIS — E11359 Type 2 diabetes mellitus with proliferative diabetic retinopathy without macular edema: Secondary | ICD-10-CM

## 2012-01-28 DIAGNOSIS — H3520 Other non-diabetic proliferative retinopathy, unspecified eye: Secondary | ICD-10-CM

## 2012-02-03 ENCOUNTER — Encounter (INDEPENDENT_AMBULATORY_CARE_PROVIDER_SITE_OTHER): Payer: 59 | Admitting: Ophthalmology

## 2012-02-03 ENCOUNTER — Encounter (INDEPENDENT_AMBULATORY_CARE_PROVIDER_SITE_OTHER): Payer: Self-pay | Admitting: Ophthalmology

## 2012-02-03 DIAGNOSIS — E1039 Type 1 diabetes mellitus with other diabetic ophthalmic complication: Secondary | ICD-10-CM

## 2012-02-03 DIAGNOSIS — E11359 Type 2 diabetes mellitus with proliferative diabetic retinopathy without macular edema: Secondary | ICD-10-CM

## 2012-02-03 DIAGNOSIS — H4310 Vitreous hemorrhage, unspecified eye: Secondary | ICD-10-CM

## 2012-02-03 NOTE — H&P (Signed)
Crewe Fogg is an 46 y.o. male.   Chief Complaint:Loss of vision right eye HPI: Diabetic and sickle thallesemia patient with vitreous hemorrhage and traction on retina right eye  Past Medical History  Diagnosis Date  . Diabetes mellitus   . Hypertension   . Sickle cell-thalassemia disease     Past Surgical History  Procedure Date  . Cholecystectomy     Family History  Problem Relation Age of Onset  . Sickle cell anemia Brother   . Diabetes type I Brother   . Kidney disease Brother    Social History:  reports that he has never smoked. He does not have any smokeless tobacco history on file. He reports that he does not drink alcohol or use illicit drugs.  Allergies:  Allergies  Allergen Reactions  . Codeine Rash     (Not in a hospital admission)  Review of systems otherwise negative  There were no vitals taken for this visit.  Physical exam: Mental status: oriented x3. Eyes: See eye exam associated with this date of surgery in media tab.  Scanned in by scanning center Ears, Nose, Throat: within normal limits Neck: Within Normal limits General: within normal limits Chest: Within normal limits Breast: deferred Heart: Within normal limits Abdomen: Within normal limits GU: deferred Extremities: within normal limits Skin: within normal limits  Assessment/Plan Vitreous hemorrhage with neovascularization right eye Plan: To [facility] for Pars plana vitrectomy right eye with laser treatment.  Laser treatment left eye  Hayden Pedro 02/03/2012, 4:19 PM

## 2012-02-04 NOTE — Progress Notes (Signed)
This encounter was created in error - please disregard.

## 2012-02-06 ENCOUNTER — Encounter (HOSPITAL_COMMUNITY): Payer: Self-pay

## 2012-02-16 ENCOUNTER — Encounter (HOSPITAL_COMMUNITY): Payer: Self-pay | Admitting: *Deleted

## 2012-02-16 MED ORDER — GATIFLOXACIN 0.5 % OP SOLN
1.0000 [drp] | OPHTHALMIC | Status: AC | PRN
  Administered 2012-02-17 (×3): 1 [drp] via OPHTHALMIC
  Filled 2012-02-16: qty 2.5

## 2012-02-16 MED ORDER — TROPICAMIDE 1 % OP SOLN
1.0000 [drp] | OPHTHALMIC | Status: AC | PRN
  Administered 2012-02-17 (×3): 1 [drp] via OPHTHALMIC
  Filled 2012-02-16: qty 3

## 2012-02-16 MED ORDER — CYCLOPENTOLATE HCL 1 % OP SOLN
1.0000 [drp] | OPHTHALMIC | Status: AC | PRN
  Administered 2012-02-17 (×3): 1 [drp] via OPHTHALMIC
  Filled 2012-02-16: qty 2

## 2012-02-16 MED ORDER — CEFAZOLIN SODIUM-DEXTROSE 2-3 GM-% IV SOLR
2.0000 g | INTRAVENOUS | Status: AC
  Administered 2012-02-17: 2 g via INTRAVENOUS
  Filled 2012-02-16: qty 50

## 2012-02-16 MED ORDER — PHENYLEPHRINE HCL 2.5 % OP SOLN
1.0000 [drp] | OPHTHALMIC | Status: AC | PRN
  Administered 2012-02-17 (×3): 1 [drp] via OPHTHALMIC
  Filled 2012-02-16: qty 3

## 2012-02-16 NOTE — Progress Notes (Signed)
Pt states that he has not  Taken Coumadin since 02/10/12 and has not takes Indocin in a long  Time.  Instructed to not take any Aspirin, Indocin, Ibuprophen or Herbal medications prior to surgery.

## 2012-02-17 ENCOUNTER — Ambulatory Visit (HOSPITAL_COMMUNITY): Payer: 59 | Admitting: Anesthesiology

## 2012-02-17 ENCOUNTER — Encounter (HOSPITAL_COMMUNITY): Payer: Self-pay | Admitting: Anesthesiology

## 2012-02-17 ENCOUNTER — Ambulatory Visit (HOSPITAL_COMMUNITY)
Admission: RE | Admit: 2012-02-17 | Discharge: 2012-02-18 | Disposition: A | Discharge status: Home / Self Care | Payer: 59 | Source: Ambulatory Visit | Attending: Ophthalmology | Admitting: Ophthalmology

## 2012-02-17 ENCOUNTER — Encounter (HOSPITAL_COMMUNITY): Payer: Self-pay | Admitting: *Deleted

## 2012-02-17 ENCOUNTER — Encounter (HOSPITAL_COMMUNITY)
Admission: RE | Disposition: A | Discharge status: Home / Self Care | Payer: Self-pay | Source: Ambulatory Visit | Attending: Ophthalmology

## 2012-02-17 ENCOUNTER — Ambulatory Visit (HOSPITAL_COMMUNITY): Payer: 59

## 2012-02-17 DIAGNOSIS — E1139 Type 2 diabetes mellitus with other diabetic ophthalmic complication: Secondary | ICD-10-CM

## 2012-02-17 DIAGNOSIS — I10 Essential (primary) hypertension: Secondary | ICD-10-CM | POA: Insufficient documentation

## 2012-02-17 DIAGNOSIS — E11319 Type 2 diabetes mellitus with unspecified diabetic retinopathy without macular edema: Secondary | ICD-10-CM

## 2012-02-17 DIAGNOSIS — H4310 Vitreous hemorrhage, unspecified eye: Secondary | ICD-10-CM

## 2012-02-17 DIAGNOSIS — Z794 Long term (current) use of insulin: Secondary | ICD-10-CM | POA: Insufficient documentation

## 2012-02-17 DIAGNOSIS — E11359 Type 2 diabetes mellitus with proliferative diabetic retinopathy without macular edema: Secondary | ICD-10-CM | POA: Insufficient documentation

## 2012-02-17 DIAGNOSIS — H4320 Crystalline deposits in vitreous body, unspecified eye: Secondary | ICD-10-CM | POA: Insufficient documentation

## 2012-02-17 DIAGNOSIS — H3509 Other intraretinal microvascular abnormalities: Secondary | ICD-10-CM | POA: Insufficient documentation

## 2012-02-17 DIAGNOSIS — R0602 Shortness of breath: Secondary | ICD-10-CM | POA: Insufficient documentation

## 2012-02-17 DIAGNOSIS — H3520 Other non-diabetic proliferative retinopathy, unspecified eye: Secondary | ICD-10-CM

## 2012-02-17 DIAGNOSIS — E1165 Type 2 diabetes mellitus with hyperglycemia: Secondary | ICD-10-CM

## 2012-02-17 HISTORY — DX: Gout, unspecified: M10.9

## 2012-02-17 HISTORY — DX: Other pulmonary embolism without acute cor pulmonale: I26.99

## 2012-02-17 HISTORY — PX: PARS PLANA VITRECTOMY: SHX2166

## 2012-02-17 LAB — BASIC METABOLIC PANEL
BUN: 30 mg/dL — ABNORMAL HIGH (ref 6–23)
CO2: 21 mEq/L (ref 19–32)
Calcium: 8.9 mg/dL (ref 8.4–10.5)
Chloride: 110 mEq/L (ref 96–112)
Creatinine, Ser: 1.93 mg/dL — ABNORMAL HIGH (ref 0.50–1.35)
GFR calc Af Amer: 46 mL/min — ABNORMAL LOW (ref >90)
GFR calc non Af Amer: 39 mL/min — ABNORMAL LOW (ref >90)
Glucose, Bld: 145 mg/dL — ABNORMAL HIGH (ref 70–99)
Potassium: 5.2 mEq/L — ABNORMAL HIGH (ref 3.5–5.1)
Sodium: 140 mEq/L (ref 135–145)

## 2012-02-17 LAB — GLUCOSE, CAPILLARY
Glucose-Capillary: 107 mg/dL — ABNORMAL HIGH (ref 70–99)
Glucose-Capillary: 111 mg/dL — ABNORMAL HIGH (ref 70–99)
Glucose-Capillary: 127 mg/dL — ABNORMAL HIGH (ref 70–99)
Glucose-Capillary: 136 mg/dL — ABNORMAL HIGH (ref 70–99)
Glucose-Capillary: 229 mg/dL — ABNORMAL HIGH (ref 70–99)
Glucose-Capillary: 232 mg/dL — ABNORMAL HIGH (ref 70–99)
Glucose-Capillary: 246 mg/dL — ABNORMAL HIGH (ref 70–99)
Glucose-Capillary: 281 mg/dL — ABNORMAL HIGH (ref 70–99)

## 2012-02-17 LAB — SURGICAL PCR SCREEN
MRSA, PCR: NEGATIVE
Staphylococcus aureus: NEGATIVE

## 2012-02-17 LAB — CBC
HCT: 31.1 % — ABNORMAL LOW (ref 39.0–52.0)
Hemoglobin: 10.9 g/dL — ABNORMAL LOW (ref 13.0–17.0)
MCH: 25.9 pg — ABNORMAL LOW (ref 26.0–34.0)
MCHC: 35 g/dL (ref 30.0–36.0)
MCV: 73.9 fL — ABNORMAL LOW (ref 78.0–100.0)
Platelets: 370 10*3/uL (ref 150–400)
RBC: 4.21 MIL/uL — ABNORMAL LOW (ref 4.22–5.81)
RDW: 17 % — ABNORMAL HIGH (ref 11.5–15.5)
WBC: 15.9 10*3/uL — ABNORMAL HIGH (ref 4.0–10.5)

## 2012-02-17 SURGERY — PARS PLANA VITRECTOMY WITH 25 GAUGE
Anesthesia: General | Site: Eye | Laterality: Right | Wound class: Clean

## 2012-02-17 MED ORDER — LACTATED RINGERS IV SOLN
INTRAVENOUS | Status: DC | PRN
  Administered 2012-02-17: 12:00:00 via INTRAVENOUS

## 2012-02-17 MED ORDER — BRIMONIDINE TARTRATE 0.2 % OP SOLN
1.0000 [drp] | Freq: Two times a day (BID) | OPHTHALMIC | Status: DC
  Filled 2012-02-17: qty 5

## 2012-02-17 MED ORDER — BSS IO SOLN
INTRAOCULAR | Status: DC | PRN
  Administered 2012-02-17: 500 mL via INTRAOCULAR

## 2012-02-17 MED ORDER — PREDNISOLONE ACETATE 1 % OP SUSP
1.0000 [drp] | Freq: Four times a day (QID) | OPHTHALMIC | Status: DC
  Filled 2012-02-17: qty 1

## 2012-02-17 MED ORDER — DEXAMETHASONE SODIUM PHOSPHATE 10 MG/ML IJ SOLN
INTRAMUSCULAR | Status: DC | PRN
  Administered 2012-02-17: 10 mg via INTRAVENOUS

## 2012-02-17 MED ORDER — MAGNESIUM HYDROXIDE 400 MG/5ML PO SUSP: 15.0000 mL | Freq: Four times a day (QID) | ORAL | Status: DC | PRN

## 2012-02-17 MED ORDER — INSULIN GLARGINE 100 UNIT/ML ~~LOC~~ SOLN: 5.0000 [IU] | Freq: Every day | SUBCUTANEOUS | Status: DC

## 2012-02-17 MED ORDER — ONDANSETRON HCL 4 MG/2ML IJ SOLN: 4.0000 mg | Freq: Once | INTRAMUSCULAR | Status: DC | PRN

## 2012-02-17 MED ORDER — GATIFLOXACIN 0.5 % OP SOLN
1.0000 [drp] | Freq: Four times a day (QID) | OPHTHALMIC | Status: DC
  Filled 2012-02-17: qty 2.5

## 2012-02-17 MED ORDER — HYDROCODONE-ACETAMINOPHEN 5-325 MG PO TABS: 1.0000 | ORAL_TABLET | Freq: Four times a day (QID) | ORAL | Status: DC | PRN

## 2012-02-17 MED ORDER — BUPIVACAINE HCL 0.75 % IJ SOLN
INTRAMUSCULAR | Status: DC | PRN
  Administered 2012-02-17: 20 mL

## 2012-02-17 MED ORDER — INDOMETHACIN ER 75 MG PO CPCR
75.0000 mg | ORAL_CAPSULE | ORAL | Status: DC | PRN
  Filled 2012-02-17: qty 1

## 2012-02-17 MED ORDER — INSULIN REGULAR HUMAN 100 UNIT/ML IJ SOLN: 10.0000 [IU] | Freq: Three times a day (TID) | INTRAMUSCULAR | Status: DC

## 2012-02-17 MED ORDER — BACITRACIN-POLYMYXIN B 500-10000 UNIT/GM OP OINT
TOPICAL_OINTMENT | OPHTHALMIC | Status: DC | PRN
  Administered 2012-02-17 (×2): 1 via OPHTHALMIC

## 2012-02-17 MED ORDER — ONDANSETRON HCL 4 MG/2ML IJ SOLN: 4.0000 mg | Freq: Four times a day (QID) | INTRAMUSCULAR | Status: DC | PRN

## 2012-02-17 MED ORDER — ACETAMINOPHEN 10 MG/ML IV SOLN
INTRAVENOUS | Status: AC
  Filled 2012-02-17: qty 100

## 2012-02-17 MED ORDER — INSULIN LISPRO 100 UNIT/ML ~~LOC~~ SOLN: 10.0000 [IU] | Freq: Three times a day (TID) | SUBCUTANEOUS | Status: DC

## 2012-02-17 MED ORDER — GATIFLOXACIN 0.5 % OP SOLN: 1.0000 [drp] | Freq: Four times a day (QID) | OPHTHALMIC | Status: DC

## 2012-02-17 MED ORDER — EPINEPHRINE HCL 1 MG/ML IJ SOLN
INTRAMUSCULAR | Status: DC | PRN
  Administered 2012-02-17: 1 mg

## 2012-02-17 MED ORDER — BACITRACIN-POLYMYXIN B 500-10000 UNIT/GM OP OINT: 1.0000 "application " | TOPICAL_OINTMENT | Freq: Four times a day (QID) | OPHTHALMIC | Status: DC

## 2012-02-17 MED ORDER — FENTANYL CITRATE 0.05 MG/ML IJ SOLN
INTRAMUSCULAR | Status: DC | PRN
  Administered 2012-02-17: 100 ug via INTRAVENOUS

## 2012-02-17 MED ORDER — PROPOFOL 10 MG/ML IV EMUL
INTRAVENOUS | Status: DC | PRN
  Administered 2012-02-17: 300 mg via INTRAVENOUS

## 2012-02-17 MED ORDER — MORPHINE SULFATE 2 MG/ML IJ SOLN: 1.0000 mg | INTRAMUSCULAR | Status: DC | PRN

## 2012-02-17 MED ORDER — DEXTROSE 5 % IV SOLN
INTRAVENOUS | Status: DC | PRN
  Administered 2012-02-17 (×2): via INTRAVENOUS

## 2012-02-17 MED ORDER — HYDROMORPHONE HCL PF 1 MG/ML IJ SOLN: 0.2500 mg | INTRAMUSCULAR | Status: DC | PRN

## 2012-02-17 MED ORDER — TETRACAINE HCL 0.5 % OP SOLN
2.0000 [drp] | Freq: Once | OPHTHALMIC | Status: DC
  Filled 2012-02-17: qty 2

## 2012-02-17 MED ORDER — FLUOROMETHOLONE 0.25 % OP SUSP: 1.0000 [drp] | Freq: Four times a day (QID) | OPHTHALMIC | Status: AC

## 2012-02-17 MED ORDER — INSULIN ASPART 100 UNIT/ML ~~LOC~~ SOLN
10.0000 [IU] | Freq: Three times a day (TID) | SUBCUTANEOUS | Status: DC
  Administered 2012-02-17: 12 [IU] via SUBCUTANEOUS
  Administered 2012-02-18: 10 [IU] via SUBCUTANEOUS

## 2012-02-17 MED ORDER — OXYCODONE-ACETAMINOPHEN 5-325 MG PO TABS
1.0000 | ORAL_TABLET | ORAL | Status: DC | PRN
  Administered 2012-02-17 (×3): 1 via ORAL
  Filled 2012-02-17 (×2): qty 1

## 2012-02-17 MED ORDER — SODIUM CHLORIDE 0.45 % IV SOLN
INTRAVENOUS | Status: DC
  Administered 2012-02-17: 14:00:00 via INTRAVENOUS

## 2012-02-17 MED ORDER — LIDOCAINE HCL (CARDIAC) 20 MG/ML IV SOLN
INTRAVENOUS | Status: DC | PRN
  Administered 2012-02-17: 100 mg via INTRAVENOUS

## 2012-02-17 MED ORDER — GLYCOPYRROLATE 0.2 MG/ML IJ SOLN
INTRAMUSCULAR | Status: DC | PRN
  Administered 2012-02-17: .6 mg via INTRAVENOUS

## 2012-02-17 MED ORDER — SODIUM HYALURONATE 10 MG/ML IO SOLN
INTRAOCULAR | Status: DC | PRN
  Administered 2012-02-17: 0.85 mL via INTRAOCULAR

## 2012-02-17 MED ORDER — NEOSTIGMINE METHYLSULFATE 1 MG/ML IJ SOLN
INTRAMUSCULAR | Status: DC | PRN
  Administered 2012-02-17: 4 mg via INTRAVENOUS

## 2012-02-17 MED ORDER — PREDNISOLONE ACETATE 1 % OP SUSP
1.0000 [drp] | Freq: Four times a day (QID) | OPHTHALMIC | Status: DC
  Filled 2012-02-17: qty 5

## 2012-02-17 MED ORDER — INSULIN GLARGINE 100 UNIT/ML ~~LOC~~ SOLN: 35.0000 [IU] | Freq: Every day | SUBCUTANEOUS | Status: DC

## 2012-02-17 MED ORDER — ACETAMINOPHEN 325 MG PO TABS: 325.0000 mg | ORAL_TABLET | ORAL | Status: DC | PRN

## 2012-02-17 MED ORDER — OXYCODONE-ACETAMINOPHEN 5-325 MG PO TABS
ORAL_TABLET | ORAL | Status: AC
  Filled 2012-02-17: qty 1

## 2012-02-17 MED ORDER — TEMAZEPAM 15 MG PO CAPS: 15.0000 mg | ORAL_CAPSULE | Freq: Every evening | ORAL | Status: DC | PRN

## 2012-02-17 MED ORDER — ATROPINE SULFATE 1 % OP SOLN
OPHTHALMIC | Status: DC | PRN
  Administered 2012-02-17: 2 [drp] via OPHTHALMIC

## 2012-02-17 MED ORDER — MIDAZOLAM HCL 5 MG/5ML IJ SOLN
INTRAMUSCULAR | Status: DC | PRN
  Administered 2012-02-17: 1 mg via INTRAVENOUS

## 2012-02-17 MED ORDER — MUPIROCIN 2 % EX OINT
TOPICAL_OINTMENT | CUTANEOUS | Status: AC
  Administered 2012-02-17: 1 via NASAL
  Filled 2012-02-17: qty 22

## 2012-02-17 MED ORDER — ACETAZOLAMIDE SODIUM 500 MG IJ SOLR
500.0000 mg | Freq: Once | INTRAMUSCULAR | Status: AC
  Administered 2012-02-18: 500 mg via INTRAVENOUS
  Filled 2012-02-17: qty 500

## 2012-02-17 MED ORDER — GENTAMICIN SULFATE 40 MG/ML IJ SOLN
INTRAMUSCULAR | Status: DC | PRN
  Administered 2012-02-17: 80 mg via INTRAMUSCULAR

## 2012-02-17 MED ORDER — LATANOPROST 0.005 % OP SOLN
1.0000 [drp] | Freq: Every day | OPHTHALMIC | Status: DC
  Filled 2012-02-17: qty 2.5

## 2012-02-17 MED ORDER — PHENYLEPHRINE HCL 10 MG/ML IJ SOLN
10.0000 mg | INTRAVENOUS | Status: DC | PRN
  Administered 2012-02-17: 20 ug/min via INTRAVENOUS

## 2012-02-17 MED ORDER — BACITRACIN-POLYMYXIN B 500-10000 UNIT/GM OP OINT
1.0000 "application " | TOPICAL_OINTMENT | Freq: Four times a day (QID) | OPHTHALMIC | Status: DC
  Filled 2012-02-17: qty 3.5

## 2012-02-17 MED ORDER — ROCURONIUM BROMIDE 100 MG/10ML IV SOLN
INTRAVENOUS | Status: DC | PRN
  Administered 2012-02-17: 50 mg via INTRAVENOUS

## 2012-02-17 MED ORDER — INSULIN ASPART 100 UNIT/ML ~~LOC~~ SOLN
0.0000 [IU] | SUBCUTANEOUS | Status: DC
  Administered 2012-02-17 (×2): 5 [IU] via SUBCUTANEOUS
  Administered 2012-02-18: 3 [IU] via SUBCUTANEOUS

## 2012-02-17 MED ORDER — 0.9 % SODIUM CHLORIDE (POUR BTL) OPTIME
TOPICAL | Status: DC | PRN
  Administered 2012-02-17: 1000 mL

## 2012-02-17 MED ORDER — INSULIN ASPART 100 UNIT/ML ~~LOC~~ SOLN: 0.0000 [IU] | SUBCUTANEOUS | Status: DC

## 2012-02-17 MED ORDER — ONDANSETRON HCL 4 MG/2ML IJ SOLN
INTRAMUSCULAR | Status: DC | PRN
  Administered 2012-02-17: 4 mg via INTRAVENOUS

## 2012-02-17 MED ORDER — BSS PLUS IO SOLN
INTRAOCULAR | Status: DC | PRN
  Administered 2012-02-17: 1 via INTRAOCULAR

## 2012-02-17 MED ORDER — ACETAMINOPHEN 10 MG/ML IV SOLN
INTRAVENOUS | Status: DC | PRN
  Administered 2012-02-17: 1000 mg via INTRAVENOUS

## 2012-02-17 MED ORDER — SODIUM CHLORIDE 0.9 % IJ SOLN
INTRAMUSCULAR | Status: DC | PRN
  Administered 2012-02-17: 10 mL via INTRAVENOUS

## 2012-02-17 MED ORDER — POLYMYXIN B SULFATE 500000 UNITS IJ SOLR
INTRAMUSCULAR | Status: DC | PRN
  Administered 2012-02-17: 500000 [IU]

## 2012-02-17 MED ORDER — LACTATED RINGERS IV SOLN
INTRAVENOUS | Status: DC
  Administered 2012-02-17: 11:00:00 via INTRAVENOUS

## 2012-02-17 SURGICAL SUPPLY — 65 items
APPLICATOR DR MATTHEWS STRL (MISCELLANEOUS) IMPLANT
BLADE EYE CATARACT 19 1.4 BEAV (BLADE) IMPLANT
BLADE MVR KNIFE 19G (BLADE) IMPLANT
BLADE MVR KNIFE 20G (BLADE) IMPLANT
CANNULA DUAL BORE 23G (CANNULA) IMPLANT
CANNULA FLEX TIP 25G (CANNULA) IMPLANT
CLOTH BEACON ORANGE TIMEOUT ST (SAFETY) ×3 IMPLANT
CORDS BIPOLAR (ELECTRODE) ×3 IMPLANT
COTTONBALL LRG STERILE PKG (GAUZE/BANDAGES/DRESSINGS) ×9 IMPLANT
DRAPE INCISE 51X51 W/FILM STRL (DRAPES) ×3 IMPLANT
DRAPE OPHTHALMIC 77X100 STRL (CUSTOM PROCEDURE TRAY) ×3 IMPLANT
FILTER BLUE MILLIPORE (MISCELLANEOUS) IMPLANT
FILTER STRAW FLUID ASPIR (MISCELLANEOUS) IMPLANT
FORCEPS ECKARDT ILM 25G SERR (OPHTHALMIC RELATED) IMPLANT
GLOVE SS BIOGEL STRL SZ 6.5 (GLOVE) ×2 IMPLANT
GLOVE SS BIOGEL STRL SZ 7 (GLOVE) ×4 IMPLANT
GLOVE SUPERSENSE BIOGEL SZ 6.5 (GLOVE) ×1
GLOVE SUPERSENSE BIOGEL SZ 7 (GLOVE) ×2
GLOVE SURG 8.5 LATEX PF (GLOVE) ×3 IMPLANT
GLOVE SURG SS PI 6.5 STRL IVOR (GLOVE) ×3 IMPLANT
GOWN STRL NON-REIN LRG LVL3 (GOWN DISPOSABLE) ×12 IMPLANT
ILLUMINATOR CHOW PICK 25GA (MISCELLANEOUS) ×3 IMPLANT
KIT BASIN OR (CUSTOM PROCEDURE TRAY) ×6 IMPLANT
KIT ROOM TURNOVER OR (KITS) ×3 IMPLANT
KNIFE CRESCENT 2.5 55 ANG (BLADE) IMPLANT
LENS BIOM SUPER VIEW SET DISP (OPHTHALMIC RELATED) IMPLANT
MARKER SKIN DUAL TIP RULER LAB (MISCELLANEOUS) IMPLANT
MASK EYE SHIELD (GAUZE/BANDAGES/DRESSINGS) ×3 IMPLANT
MICROPICK 25G (MISCELLANEOUS)
NEEDLE 18GX1X1/2 (RX/OR ONLY) (NEEDLE) ×3 IMPLANT
NEEDLE 25GX 5/8IN NON SAFETY (NEEDLE) ×3 IMPLANT
NEEDLE 27GAX1X1/2 (NEEDLE) IMPLANT
NEEDLE FILTER BLUNT 18X 1/2SAF (NEEDLE) ×1
NEEDLE FILTER BLUNT 18X1 1/2 (NEEDLE) ×2 IMPLANT
NEEDLE HYPO 30X.5 LL (NEEDLE) ×6 IMPLANT
NS IRRIG 1000ML POUR BTL (IV SOLUTION) ×3 IMPLANT
PACK VITRECTOMY CUSTOM (CUSTOM PROCEDURE TRAY) ×3 IMPLANT
PAD ARMBOARD 7.5X6 YLW CONV (MISCELLANEOUS) ×6 IMPLANT
PAD EYE OVAL STERILE LF (GAUZE/BANDAGES/DRESSINGS) ×3 IMPLANT
PAK VITRECTOMY PIK 25 GA (OPHTHALMIC RELATED) ×3 IMPLANT
PENCIL BIPOLAR 25GA STR DISP (OPHTHALMIC RELATED) ×3 IMPLANT
PICK MICROPICK 25G (MISCELLANEOUS) IMPLANT
PROBE DIRECTIONAL LASER (MISCELLANEOUS) ×3 IMPLANT
REPL STRA BRUSH NEEDLE (NEEDLE) IMPLANT
RESERVOIR BACK FLUSH (MISCELLANEOUS) IMPLANT
ROLLS DENTAL (MISCELLANEOUS) ×6 IMPLANT
SCRAPER DIAMOND DUST MEMBRANE (MISCELLANEOUS) IMPLANT
SPONGE SURGIFOAM ABS GEL 12-7 (HEMOSTASIS) ×3 IMPLANT
STOPCOCK 4 WAY LG BORE MALE ST (IV SETS) ×3 IMPLANT
SUT CHROMIC 7 0 TG140 8 (SUTURE) IMPLANT
SUT ETHILON 10 0 CS140 6 (SUTURE) IMPLANT
SUT ETHILON 9 0 TG140 8 (SUTURE) IMPLANT
SUT POLY NON ABSORB 10-0 8 STR (SUTURE) IMPLANT
SUT SILK 4 0 RB 1 (SUTURE) IMPLANT
SYR 20CC LL (SYRINGE) ×3 IMPLANT
SYR 5ML LL (SYRINGE) IMPLANT
SYR BULB 3OZ (MISCELLANEOUS) ×3 IMPLANT
SYR TB 1ML LUER SLIP (SYRINGE) ×3 IMPLANT
SYRINGE 10CC LL (SYRINGE) IMPLANT
TAPE SURG TRANSPORE 1 IN (GAUZE/BANDAGES/DRESSINGS) ×2 IMPLANT
TAPE SURGICAL TRANSPORE 1 IN (GAUZE/BANDAGES/DRESSINGS) ×1
TOWEL OR 17X24 6PK STRL BLUE (TOWEL DISPOSABLE) ×9 IMPLANT
TROCAR CANNULA 25GA (CANNULA) IMPLANT
WATER STERILE IRR 1000ML POUR (IV SOLUTION) ×3 IMPLANT
WIPE INSTRUMENT VISIWIPE 73X73 (MISCELLANEOUS) ×3 IMPLANT

## 2012-02-17 NOTE — Preoperative (Signed)
Beta Blockers   Reason not to administer Beta Blockers:Not Applicable 

## 2012-02-17 NOTE — Op Note (Signed)
NAMEMARCELLUS, PULLIAM                ACCOUNT NO.:  [id number]  MEDICAL RECORD NO.:  [id number]  LOCATION:  51                         FACILITY:  [facility]  PHYSICIAN:  Chrystie Nose. [name], M.D. DATE OF BIRTH:  [birth year]  DATE OF PROCEDURE:  02/17/2012 DATE OF DISCHARGE:                              OPERATIVE REPORT   ADMISSION DIAGNOSES:  Vitreous hemorrhage, right eye.  Proliferative diabetic retinopathy, both eyes.  Sickle thalassemia retinopathy, both eyes.  PROCEDURES:  Argon laser photocoagulation, left eye.  Pars plana vitrectomy, right eye.  Membrane peel, right eye.  Gas fluid exchange, right eye.  Retinal photocoagulation, right eye.  SURGEON:  Chrystie Nose. [name], MD  ASSISTANT:  Deatra Ina, FA.  ANESTHESIA:  General.  DETAILS:  After proper endotracheal anesthesia, the attention was carried to the left eye where the indirect ophthalmoscope laser was moved into place, 1015 burns were placed around the large area of neovascularization in the upper temporal quadrant.  The power was 350 mW, 1000 microns each and 0.1 seconds each.  The eye was patched and covered.  The attention was then carried to the right eye where the usual prep and drape was performed.  A 25-gauge trocar was placed at 8, 10, and 2 o'clock.  Provisc placed on the corneal surface and the biome viewing system was moved into place.  Pars plana vitrectomy was begun just behind the crystalline lens.  Dense white dehemoglobinized blood was encountered.  This was removed carefully under low suction and rapid cutting.  Asteroid hyalosis was present and this was removed carefully as well.  The vitrectomy was carried down to the macular surface where membranes were attached to the disk into the nasal retina.  These were stripped and peeled with the lighted pick.  The vitrectomy was carried to the mid periphery where old white blood was encountered and the vitrectomy is carried into the far periphery where a large  neovascular frond was seen at 12 o'clock.  This was trimmed with the vitreous cutter and cauterized with Endodiathermy.  The vitrectomy was carried into the far periphery for 360 degrees.  The wide field biome viewing lens were used for wide field viewing.  Scleral depression was used to gain access to the vitreous base and this was trimmed for 360 degrees.  The endolaser was positioned in the eye, 1472 burns were placed around the neovascular frond and around the retinal periphery.  The power was 1000 mW, 1000 microns and 0.1 seconds each.  A 30% gas fluid exchange was performed.  The instruments were removed from the eye.  The trocars were removed from the eye.  The wounds were found to be secure.  Polymyxin and gentamicin were irrigated into tenon space.  Atropine solution was applied.  Decadron 10 mg was injected into the lower subconjunctival space.  Marcaine was injected around the globe for postop pain.  Closing pressure was 10 with a Baer care tonometer. Complications none, duration 1 hour.  The patient was awakened and taken to recovery in satisfactory condition.     Chrystie Nose. [name], M.D.     JDM/MEDQ  D:  02/17/2012  T:  02/17/2012  Job:  [id number]

## 2012-02-17 NOTE — Anesthesia Preprocedure Evaluation (Addendum)
Anesthesia Evaluation  Patient identified by MRN, date of birth, ID band Patient awake    Reviewed: Allergy & Precautions, H&P , NPO status , Patient's Chart, lab work & pertinent test results  Airway Mallampati: II TM Distance: >3 FB Neck ROM: full    Dental  (+) Teeth Intact   Pulmonary shortness of breath and with exertion,    Pulmonary exam normal       Cardiovascular hypertension, Pt. on medications negative cardio ROS  Rhythm:regular Rate:Normal     Neuro/Psych    GI/Hepatic negative GI ROS,   Endo/Other  Diabetes mellitus-, Type 2, Insulin DependentMorbid obesity  Renal/GU      Musculoskeletal negative musculoskeletal ROS (+)   Abdominal   Peds  Hematology Sickle Cell Disease   Anesthesia Other Findings   Reproductive/Obstetrics                         Anesthesia Physical Anesthesia Plan  ASA: III  Anesthesia Plan: General   Post-op Pain Management:    Induction: Intravenous  Airway Management Planned: Oral ETT  Additional Equipment:   Intra-op Plan:   Post-operative Plan: Extubation in OR  Informed Consent: I have reviewed the patients History and Physical, chart, labs and discussed the procedure including the risks, benefits and alternatives for the proposed anesthesia with the patient or authorized representative who has indicated his/her understanding and acceptance.   Dental advisory given  Plan Discussed with: CRNA, Anesthesiologist and Surgeon  Anesthesia Plan Comments:        Anesthesia Quick Evaluation

## 2012-02-17 NOTE — Anesthesia Procedure Notes (Signed)
Procedure Name: Intubation Date/Time: 02/17/2012 11:50 AM Performed by: Wanita Chamberlain Pre-anesthesia Checklist: Patient identified, Timeout performed, Emergency Drugs available, Suction available and Patient being monitored Patient Re-evaluated:Patient Re-evaluated prior to inductionOxygen Delivery Method: Circle system utilized Preoxygenation: Pre-oxygenation with 100% oxygen Intubation Type: IV induction Ventilation: Two handed mask ventilation required Laryngoscope Size: Mac and 4 Grade View: Grade III Tube type: Oral Tube size: 8.0 mm Number of attempts: 2 Airway Equipment and Method: Bougie stylet Placement Confirmation: ETT inserted through vocal cords under direct vision,  positive ETCO2,  CO2 detector and breath sounds checked- equal and bilateral Secured at: 24 cm Tube secured with: Tape Dental Injury: Teeth and Oropharynx as per pre-operative assessment  Difficulty Due To: Difficulty was anticipated, Difficult Airway- due to large tongue and Difficult Airway- due to dentition Comments: Pt w/ a small mouth.  MAC #3 unable to reach/lift epiglottis.  MAC #4 utilized with view of arytenoids.  Bougie stylet passed non traumatically with ease.    Jarrett Ables CRNA

## 2012-02-17 NOTE — Transfer of Care (Signed)
Immediate Anesthesia Transfer of Care Note  Patient: [facility]  Procedure(s) Performed: Procedure(s) (LRB): PARS PLANA VITRECTOMY WITH 25 GAUGE (Right) PHOTOCOAGULATION WITH LASER (Left)  Patient Location: PACU  Anesthesia Type: General  Level of Consciousness: awake, alert , oriented and patient cooperative  Airway & Oxygen Therapy: Patient Spontanous Breathing and Patient connected to nasal cannula oxygen  Post-op Assessment: Report given to PACU RN and Post -op Vital signs reviewed and stable  Post vital signs: Reviewed and stable  Complications: No apparent anesthesia complications

## 2012-02-17 NOTE — Anesthesia Postprocedure Evaluation (Signed)
  Anesthesia Post-op Note  Patient: [name]  Procedure(s) Performed: Procedure(s) (LRB): PARS PLANA VITRECTOMY WITH 25 GAUGE (Right) PHOTOCOAGULATION WITH LASER (Left)  Patient Location: PACU  Anesthesia Type: General  Level of Consciousness: awake, alert , oriented and patient cooperative  Airway and Oxygen Therapy: Patient Spontanous Breathing and Patient connected to nasal cannula oxygen  Post-op Pain: none  Post-op Assessment: Post-op Vital signs reviewed, Patient's Cardiovascular Status Stable, Respiratory Function Stable, Patent Airway, No signs of Nausea or vomiting and Pain level controlled  Post-op Vital Signs: stable  Complications: No apparent anesthesia complications

## 2012-02-17 NOTE — Procedures (Signed)
Brief Operative note   Preoperative diagnosis:  Pre-Op Diagnosis Codes:    * Vitreous hemorrhage [379.23]    * Proliferative diabetic retinopathy [250.50, 362.02] Postoperative diagnosis  Post-Op Diagnosis Codes:    * Vitreous hemorrhage [379.23]    * Proliferative diabetic retinopathy [250.50, 362.02]  Procedures: Laser photocoagulation Left eye.  Pars Plana vitrectomy right eye. Laser and gas  Surgeon:  Hayden Pedro, MD.  Assistant:  Deatra Ina SA   Anesthesia: General  Specimen: none  Estimated blood loss:  1cc  Complications: none  Patient sent to PACU in good condition  Composed by Hayden Pedro MD  Dictation number: [id number]

## 2012-02-17 NOTE — H&P (Signed)
I examined the patient today and there is no change in the medical status 

## 2012-02-18 LAB — GLUCOSE, CAPILLARY
Glucose-Capillary: 176 mg/dL — ABNORMAL HIGH (ref 70–99)
Glucose-Capillary: 164 mg/dL — ABNORMAL HIGH (ref 70–99)

## 2012-02-18 MED ORDER — GATIFLOXACIN 0.5 % OP SOLN: 1.0000 [drp] | Freq: Four times a day (QID) | OPHTHALMIC | Status: DC

## 2012-02-18 MED ORDER — FLUOROMETHOLONE 0.1 % OP SUSP: 1.0000 [drp] | Freq: Four times a day (QID) | OPHTHALMIC | Status: AC

## 2012-02-18 MED ORDER — FLUOROMETHOLONE 0.1 % OP SUSP
1.0000 [drp] | Freq: Four times a day (QID) | OPHTHALMIC | Status: DC
  Filled 2012-02-18: qty 5

## 2012-02-18 MED ORDER — PREDNISOLONE ACETATE 1 % OP SUSP: 1.0000 [drp] | Freq: Four times a day (QID) | OPHTHALMIC | Status: AC

## 2012-02-18 MED ORDER — BACITRACIN-POLYMYXIN B 500-10000 UNIT/GM OP OINT: 1.0000 "application " | TOPICAL_OINTMENT | Freq: Four times a day (QID) | OPHTHALMIC | Status: AC

## 2012-02-18 NOTE — Progress Notes (Signed)
02/18/2012, 6:57 AM  Mental Status:  Awake, Alert, Oriented  Anterior segment: Cornea  Clear    Anterior Chamber Clear    Lens:   Clear, IOL, Cataract  Intra Ocular Pressure 13 mmHg with Tonopen  Vitreous: Clear 25%gas bubble   Retina:  Attached Good laser reaction   Impression: Excellent result Retina attached   Final Diagnosis: Active Problems:  Vitreous hemorrhage   Plan: start post operative eye drops.  Discharge to home.  Give post operative instructions  Hayden Pedro 02/18/2012, 6:57 AM

## 2012-02-18 NOTE — Discharge Summary (Signed)
Discharge summary not needed on [facility] patients per medical records. 

## 2012-02-19 ENCOUNTER — Encounter (HOSPITAL_COMMUNITY): Payer: Self-pay | Admitting: Ophthalmology

## 2012-02-24 ENCOUNTER — Inpatient Hospital Stay (INDEPENDENT_AMBULATORY_CARE_PROVIDER_SITE_OTHER): Payer: 59 | Admitting: Ophthalmology

## 2012-02-24 DIAGNOSIS — E11359 Type 2 diabetes mellitus with proliferative diabetic retinopathy without macular edema: Secondary | ICD-10-CM

## 2012-02-24 DIAGNOSIS — E1039 Type 1 diabetes mellitus with other diabetic ophthalmic complication: Secondary | ICD-10-CM

## 2012-03-17 ENCOUNTER — Encounter (INDEPENDENT_AMBULATORY_CARE_PROVIDER_SITE_OTHER): Payer: 59 | Admitting: Ophthalmology

## 2012-03-17 DIAGNOSIS — E11359 Type 2 diabetes mellitus with proliferative diabetic retinopathy without macular edema: Secondary | ICD-10-CM

## 2012-03-17 DIAGNOSIS — E1039 Type 1 diabetes mellitus with other diabetic ophthalmic complication: Secondary | ICD-10-CM

## 2012-05-26 ENCOUNTER — Encounter (INDEPENDENT_AMBULATORY_CARE_PROVIDER_SITE_OTHER): Payer: 59 | Admitting: Ophthalmology

## 2012-05-26 DIAGNOSIS — H43819 Vitreous degeneration, unspecified eye: Secondary | ICD-10-CM

## 2012-05-26 DIAGNOSIS — H2510 Age-related nuclear cataract, unspecified eye: Secondary | ICD-10-CM

## 2012-05-26 DIAGNOSIS — E1039 Type 1 diabetes mellitus with other diabetic ophthalmic complication: Secondary | ICD-10-CM

## 2012-05-26 DIAGNOSIS — E11359 Type 2 diabetes mellitus with proliferative diabetic retinopathy without macular edema: Secondary | ICD-10-CM

## 2012-07-23 ENCOUNTER — Telehealth: Payer: Self-pay | Admitting: Oncology

## 2012-07-23 ENCOUNTER — Ambulatory Visit: Payer: Self-pay | Admitting: Oncology

## 2012-07-23 ENCOUNTER — Other Ambulatory Visit: Payer: Self-pay | Admitting: Lab

## 2012-07-23 NOTE — Telephone Encounter (Signed)
cx'd 11/15 appt per wife. Will call back to r/. Desk nurse aware.

## 2012-08-27 ENCOUNTER — Ambulatory Visit: Payer: 59

## 2012-09-17 ENCOUNTER — Ambulatory Visit: Payer: 59

## 2012-10-04 ENCOUNTER — Emergency Department (HOSPITAL_BASED_OUTPATIENT_CLINIC_OR_DEPARTMENT_OTHER): Payer: 59

## 2012-10-04 ENCOUNTER — Emergency Department (HOSPITAL_BASED_OUTPATIENT_CLINIC_OR_DEPARTMENT_OTHER)
Admission: EM | Admit: 2012-10-04 | Discharge: 2012-10-04 | Disposition: A | Discharge status: Home / Self Care | Payer: 59 | Attending: Emergency Medicine | Admitting: Emergency Medicine

## 2012-10-04 ENCOUNTER — Encounter (HOSPITAL_BASED_OUTPATIENT_CLINIC_OR_DEPARTMENT_OTHER): Payer: Self-pay | Admitting: *Deleted

## 2012-10-04 ENCOUNTER — Emergency Department (HOSPITAL_COMMUNITY): Payer: 59

## 2012-10-04 DIAGNOSIS — Z86711 Personal history of pulmonary embolism: Secondary | ICD-10-CM | POA: Insufficient documentation

## 2012-10-04 DIAGNOSIS — R0600 Dyspnea, unspecified: Secondary | ICD-10-CM

## 2012-10-04 DIAGNOSIS — Z7982 Long term (current) use of aspirin: Secondary | ICD-10-CM | POA: Insufficient documentation

## 2012-10-04 DIAGNOSIS — Z79899 Other long term (current) drug therapy: Secondary | ICD-10-CM | POA: Insufficient documentation

## 2012-10-04 DIAGNOSIS — R0609 Other forms of dyspnea: Secondary | ICD-10-CM | POA: Insufficient documentation

## 2012-10-04 DIAGNOSIS — Z794 Long term (current) use of insulin: Secondary | ICD-10-CM | POA: Insufficient documentation

## 2012-10-04 DIAGNOSIS — Z8639 Personal history of other endocrine, nutritional and metabolic disease: Secondary | ICD-10-CM | POA: Insufficient documentation

## 2012-10-04 DIAGNOSIS — I10 Essential (primary) hypertension: Secondary | ICD-10-CM | POA: Insufficient documentation

## 2012-10-04 DIAGNOSIS — R0989 Other specified symptoms and signs involving the circulatory and respiratory systems: Secondary | ICD-10-CM | POA: Insufficient documentation

## 2012-10-04 DIAGNOSIS — D5740 Sickle-cell thalassemia without crisis: Secondary | ICD-10-CM | POA: Insufficient documentation

## 2012-10-04 DIAGNOSIS — Z862 Personal history of diseases of the blood and blood-forming organs and certain disorders involving the immune mechanism: Secondary | ICD-10-CM | POA: Insufficient documentation

## 2012-10-04 DIAGNOSIS — E119 Type 2 diabetes mellitus without complications: Secondary | ICD-10-CM | POA: Insufficient documentation

## 2012-10-04 LAB — BASIC METABOLIC PANEL
BUN: 30 mg/dL — ABNORMAL HIGH (ref 6–23)
CO2: 19 mEq/L (ref 19–32)
Calcium: 8.7 mg/dL (ref 8.4–10.5)
Chloride: 110 mEq/L (ref 96–112)
Creatinine, Ser: 2.2 mg/dL — ABNORMAL HIGH (ref 0.50–1.35)
GFR calc Af Amer: 39 mL/min — ABNORMAL LOW (ref >90)
GFR calc non Af Amer: 34 mL/min — ABNORMAL LOW (ref >90)
Glucose, Bld: 178 mg/dL — ABNORMAL HIGH (ref 70–99)
Potassium: 4.6 mEq/L (ref 3.5–5.1)
Sodium: 139 mEq/L (ref 135–145)

## 2012-10-04 LAB — CBC WITH DIFFERENTIAL/PLATELET
Basophils Absolute: 0.2 10*3/uL — ABNORMAL HIGH (ref 0.0–0.1)
Basophils Relative: 1 % (ref 0–1)
Eosinophils Absolute: 0.8 10*3/uL — ABNORMAL HIGH (ref 0.0–0.7)
Eosinophils Relative: 4 % (ref 0–5)
HCT: 27.7 % — ABNORMAL LOW (ref 39.0–52.0)
Hemoglobin: 9.9 g/dL — ABNORMAL LOW (ref 13.0–17.0)
Lymphocytes Relative: 24 % (ref 12–46)
Lymphs Abs: 4.5 10*3/uL — ABNORMAL HIGH (ref 0.7–4.0)
MCH: 26.5 pg (ref 26.0–34.0)
MCHC: 35.7 g/dL (ref 30.0–36.0)
MCV: 74.3 fL — ABNORMAL LOW (ref 78.0–100.0)
Monocytes Absolute: 1.7 10*3/uL — ABNORMAL HIGH (ref 0.1–1.0)
Monocytes Relative: 9 % (ref 3–12)
Neutro Abs: 11.7 10*3/uL — ABNORMAL HIGH (ref 1.7–7.7)
Neutrophils Relative %: 62 % (ref 43–77)
Platelets: 349 10*3/uL (ref 150–400)
RBC: 3.73 MIL/uL — ABNORMAL LOW (ref 4.22–5.81)
RDW: 17.8 % — ABNORMAL HIGH (ref 11.5–15.5)
WBC: 18.9 10*3/uL — ABNORMAL HIGH (ref 4.0–10.5)
nRBC: 4 /100 WBC — ABNORMAL HIGH

## 2012-10-04 LAB — PROTIME-INR
INR: 1 (ref 0.00–1.49)
Prothrombin Time: 13.1 seconds (ref 11.6–15.2)

## 2012-10-04 LAB — PRO B NATRIURETIC PEPTIDE: Pro B Natriuretic peptide (BNP): 494.7 pg/mL — ABNORMAL HIGH (ref 0–125)

## 2012-10-04 LAB — D-DIMER, QUANTITATIVE (NOT AT ARMC): D-Dimer, Quant: 3.29 ug/mL-FEU — ABNORMAL HIGH (ref 0.00–0.48)

## 2012-10-04 MED ORDER — TECHNETIUM TC 99M DIETHYLENETRIAME-PENTAACETIC ACID: 40.0000 | Freq: Once | INTRAVENOUS | Status: DC | PRN

## 2012-10-04 MED ORDER — SODIUM CHLORIDE 0.9 % IV BOLUS (SEPSIS)
1000.0000 mL | Freq: Once | INTRAVENOUS | Status: AC
  Administered 2012-10-04: 1000 mL via INTRAVENOUS

## 2012-10-04 MED ORDER — FUROSEMIDE 20 MG PO TABS: 20.0000 mg | ORAL_TABLET | Freq: Two times a day (BID) | ORAL | Status: DC

## 2012-10-04 MED ORDER — TECHNETIUM TO 99M ALBUMIN AGGREGATED
5.0000 | Freq: Once | INTRAVENOUS | Status: AC | PRN
  Administered 2012-10-04: 5 via INTRAVENOUS

## 2012-10-04 MED ORDER — ENOXAPARIN SODIUM 150 MG/ML ~~LOC~~ SOLN
1.0000 mg/kg | Freq: Once | SUBCUTANEOUS | Status: AC
  Administered 2012-10-04: 130 mg via SUBCUTANEOUS
  Filled 2012-10-04: qty 1

## 2012-10-04 NOTE — ED Notes (Addendum)
Pt to [facility].  Wife accompanied.

## 2012-10-04 NOTE — ED Notes (Signed)
Received bedside report from [place], [place].  Patient currently in [facility] Pulmonary scan with wife at bedside; will continue to monitor.

## 2012-10-04 NOTE — ED Notes (Signed)
Dr. Alvino Chapel at bedside explaining test results and plan of care.

## 2012-10-04 NOTE — ED Notes (Signed)
[facility] to bedside.

## 2012-10-04 NOTE — ED Provider Notes (Addendum)
History     CSN: [id number]  Arrival date & time 10/04/12  16   First MD Initiated Contact with Patient 10/04/12 1335      Chief Complaint  Patient presents with  . Shortness of Breath   47 y.o. Male with history of pe of unknown source presents today with right leg pain yesterday,now resolved, and worsening dyspnea for two days. He denies chest pain,  fever, chills, cough, or uri symtpoms.  He has been off coumadin since august.  He was seen by his pmd prior to coming here and was sent here for evaluation for pe.  Patient is a 47 y.o. male presenting with shortness of breath. The history is provided by the patient.  Shortness of Breath  The current episode started 2 days ago. The onset is undetermined. The problem occurs continuously. The problem has been gradually worsening. The problem is moderate. The symptoms are relieved by rest. The symptoms are aggravated by activity. Associated symptoms include shortness of breath. Pertinent negatives include no chest pain, no chest pressure, no fever, no rhinorrhea and no sore throat. The cough is non-productive. Nothing relieves the cough. Nothing worsens the cough. The Heimlich maneuver was not attempted. He was not exposed to toxic fumes. He has not inhaled smoke recently. He has had prior hospitalizations. He has had no prior ICU admissions. He has had no prior intubations. Past medical history comments: h.o. pulmonary embolism  Patient had right leg pain yesterday. .    Past Medical History  Diagnosis Date  . Diabetes mellitus   . Hypertension   . Sickle cell-thalassemia disease   . Gout   . Pulmonary embolism 07/2011    on Coumadin  . Biopsychosocial stress experienced by infants at birth 2012    negative  bone marrow  biopsy.     Past Surgical History  Procedure Date  . Cholecystectomy   . Pars plana vitrectomy 02/17/2012    Procedure: PARS PLANA VITRECTOMY WITH 25 GAUGE;  Surgeon: Hayden Pedro, MD;  Location: [facility];  Service:  Ophthalmology;  Laterality: Right;    Family History  Problem Relation Age of Onset  . Sickle cell anemia Brother   . Diabetes type I Brother   . Kidney disease Brother     History  Substance Use Topics  . Smoking status: Never Smoker   . Smokeless tobacco: Not on file  . Alcohol Use: No      Review of Systems  Constitutional: Negative.  Negative for fever.  HENT: Negative for sore throat and rhinorrhea.   Respiratory: Positive for shortness of breath.   Cardiovascular: Negative for chest pain.  Gastrointestinal: Negative.   Genitourinary: Negative.   Musculoskeletal: Negative.   Neurological: Negative.   Hematological: Negative.   Psychiatric/Behavioral: Negative.     Allergies  Codeine  Home Medications   Current Outpatient Rx  Name  Route  Sig  Dispense  Refill  . GATIFLOXACIN 0.5 % OP SOLN   Right Eye   Place 1 drop into the right eye 4 (four) times daily.         [name] HYDROCODONE-ACETAMINOPHEN 5-500 MG PO TABS   Oral   Take 1 tablet by mouth every 6 (six) hours as needed. For pain         . INDOMETHACIN ER 75 MG PO CPCR   Oral   Take 75 mg by mouth as needed. For gout flares         . INSULIN GLARGINE 100 UNIT/ML  [place] SOLN   Subcutaneous   Inject 35 Units into the skin daily.   10 mL   0   . INSULIN LISPRO (HUMAN) 100 UNIT/ML [place] SOLN   Subcutaneous   Inject 10 Units into the skin 3 (three) times daily before meals. Sliding scale         . WARFARIN SODIUM 10 MG PO TABS   Oral   Take 10 mg by mouth daily. Patient will be taking 5 mg on 5/31 and 6/1 & then will resume his 87m daily regimen           BP 162/92  Pulse 90  Temp 98.3 F (36.8 C) (Oral)  Resp 18  Ht 5' 9"  (1.753 m)  Wt 280 lb (127.007 kg)  BMI 41.35 kg/m2  SpO2 95%  Physical Exam  Nursing note and vitals reviewed. Constitutional: He is oriented to person, place, and time. He appears well-developed and well-nourished.       Morbidly obese  HENT:  Head: Normocephalic  and atraumatic.  Right Ear: External ear normal.  Left Ear: External ear normal.  Nose: Nose normal.  Mouth/Throat: Oropharynx is clear and moist.  Eyes: Conjunctivae normal and EOM are normal. Pupils are equal, round, and reactive to light.  Neck: Normal range of motion. Neck supple.  Cardiovascular: Normal rate, regular rhythm, normal heart sounds and intact distal pulses.   Pulmonary/Chest: Effort normal and breath sounds normal. No respiratory distress. He has no wheezes. He exhibits no tenderness.  Abdominal: Soft. Bowel sounds are normal. He exhibits no distension and no mass. There is no tenderness. There is no guarding.  Musculoskeletal: Normal range of motion.       Bandage right great toe  Neurological: He is alert and oriented to person, place, and time. He has normal reflexes. He exhibits normal muscle tone. Coordination normal.  Skin: Skin is warm and dry.  Psychiatric: He has a normal mood and affect. His behavior is normal. Judgment and thought content normal.    ED Course  Procedures (including critical care time)   Labs Reviewed  CBC WITH DIFFERENTIAL  BASIC METABOLIC PANEL   No results found.   No diagnosis found. Results for orders placed during the hospital encounter of 10/04/12  CBC WITH DIFFERENTIAL      Component Value Range   WBC 18.9 (*) 4.0 - 10.5 K/uL   RBC 3.73 (*) 4.22 - 5.81 MIL/uL   Hemoglobin 9.9 (*) 13.0 - 17.0 g/dL   HCT 27.7 (*) 39.0 - 52.0 %   MCV 74.3 (*) 78.0 - 100.0 fL   MCH 26.5  26.0 - 34.0 pg   MCHC 35.7  30.0 - 36.0 g/dL   RDW 17.8 (*) 11.5 - 15.5 %   Platelets 349  150 - 400 K/uL   Neutrophils Relative 62  43 - 77 %   Lymphocytes Relative 24  12 - 46 %   Monocytes Relative 9  3 - 12 %   Eosinophils Relative 4  0 - 5 %   Basophils Relative 1  0 - 1 %   nRBC 4 (*) 0 /100 WBC   Neutro Abs 11.7 (*) 1.7 - 7.7 K/uL   Lymphs Abs 4.5 (*) 0.7 - 4.0 K/uL   Monocytes Absolute 1.7 (*) 0.1 - 1.0 K/uL   Eosinophils Absolute 0.8 (*) 0.0 -  0.7 K/uL   Basophils Absolute 0.2 (*) 0.0 - 0.1 K/uL   RBC Morphology SPHEROCYTES     Smear Review LARGE  PLATELETS PRESENT    BASIC METABOLIC PANEL      Component Value Range   Sodium 139  135 - 145 mEq/L   Potassium 4.6  3.5 - 5.1 mEq/L   Chloride 110  96 - 112 mEq/L   CO2 19  19 - 32 mEq/L   Glucose, Bld 178 (*) 70 - 99 mg/dL   BUN 30 (*) 6 - 23 mg/dL   Creatinine, Ser 2.20 (*) 0.50 - 1.35 mg/dL   Calcium 8.7  8.4 - 10.5 mg/dL   GFR calc non Af Amer 34 (*) >90 mL/min   GFR calc Af Amer 39 (*) >90 mL/min     10/04/2012  Date: 10/04/2012  Rate: 90  Rhythm: normal sinus rhythm  QRS Axis: normal  Intervals: normal  ST/T Wave abnormalities: nonspecific T wave changes  Conduction Disutrbances:none  Narrative Interpretation:   Old EKG Reviewed: changes noted, nonspecific t wave changes new from first prior   MDM   Patient with worsening renal insufficiency and is not a candidate for ct angiogram.  He had a prior vq scan at [facility] and will be transferred there for repeat vq.  Discussed with Dr. Darl Householder and Candise Che and transport ensuing.  Patient and wife voice understanding of plan.  Lovenox to be given here.       Shaune Pollack, MD 10/04/12 [place], MD 10/04/12 [phone]

## 2012-10-04 NOTE — ED Notes (Signed)
Pt brought in via [facility]; pt placed on monitor, continuous pulse oximetry, blood pressure cuff and oxygen [place] (2L); wife at bedisde

## 2012-10-04 NOTE — ED Notes (Signed)
Nuclear Med tech called with no response.  Radiology is attempting to locate tech.

## 2012-10-04 NOTE — ED Notes (Signed)
Patient given copy of discharge paperwork; went over discharge instructions with patient.  Patient instructed to take Lasix as directed, to follow up with primary care physician, and to return to the ED for new, worsening, or concerning symptoms.

## 2012-10-04 NOTE — ED Provider Notes (Signed)
History     CSN: [id number]  Arrival date & time 10/04/12  55   First MD Initiated Contact with Patient 10/04/12 1335      Chief Complaint  Patient presents with  . Shortness of Breath    (Consider location/radiation/quality/duration/timing/severity/associated sxs/prior treatment) The history is provided by the patient.   patient presents with shortness of breath over the last couple days. Also has had a couple cramps in his right leg. He was seen at [facility] and was transferred here for VQ scan after lab work shows renal insufficiency. He was previously on Coumadin for PE. He states he was taken off it in June. He states he was taken off because her done with the therapy. He also states that he had some bleeding in his eyes. No chest pain. He is worsening shortness of breath with exertion. No fevers. No cough.  Past Medical History  Diagnosis Date  . Diabetes mellitus   . Hypertension   . Sickle cell-thalassemia disease   . Gout   . Pulmonary embolism 07/2011    on Coumadin  . Biopsychosocial stress experienced by infants at birth 2012    negative  bone marrow  biopsy.     Past Surgical History  Procedure Date  . Cholecystectomy   . Pars plana vitrectomy 02/17/2012    Procedure: PARS PLANA VITRECTOMY WITH 25 GAUGE;  Surgeon: Hayden Pedro, MD;  Location: [place];  Service: Ophthalmology;  Laterality: Right;    Family History  Problem Relation Age of Onset  . Sickle cell anemia Brother   . Diabetes type I Brother   . Kidney disease Brother     History  Substance Use Topics  . Smoking status: Never Smoker   . Smokeless tobacco: Not on file  . Alcohol Use: No      Review of Systems  Constitutional: Negative for activity change and appetite change.  HENT: Negative for neck stiffness.   Eyes: Negative for pain.  Respiratory: Positive for shortness of breath. Negative for chest tightness.   Cardiovascular: Negative for chest pain and leg swelling.    Gastrointestinal: Negative for nausea, vomiting, abdominal pain and diarrhea.  Genitourinary: Negative for flank pain.  Musculoskeletal: Negative for back pain.  Skin: Negative for rash.  Neurological: Negative for weakness, numbness and headaches.  Psychiatric/Behavioral: Negative for behavioral problems.    Allergies  Codeine  Home Medications   Current Outpatient Rx  Name  Route  Sig  Dispense  Refill  . ASPIRIN 81 MG PO CHEW   Oral   Chew 81 mg by mouth daily.         . IBUPROFEN 200 MG PO TABS   Oral   Take 400 mg by mouth every 6 (six) hours as needed. For pain         . INSULIN GLARGINE 100 UNIT/ML [place] SOLN   Subcutaneous   Inject 65 Units into the skin at bedtime.         . INSULIN LISPRO (HUMAN) 100 UNIT/ML [place] SOLN   Subcutaneous   Inject 10 Units into the skin 3 (three) times daily before meals. Sliding scale         . VALSARTAN-HYDROCHLOROTHIAZIDE 320-25 MG PO TABS   Oral   Take 1 tablet by mouth daily.         . FUROSEMIDE 20 MG PO TABS   Oral   Take 1 tablet (20 mg total) by mouth 2 (two) times daily.  8 tablet   0     BP 153/97  Pulse 70  Temp 98.5 F (36.9 C) (Oral)  Resp 20  Ht 5' 9"  (1.753 m)  Wt 280 lb (127.007 kg)  BMI 41.35 kg/m2  SpO2 100%  Physical Exam  Nursing note and vitals reviewed. Constitutional: He is oriented to person, place, and time. He appears well-developed and well-nourished.  HENT:  Head: Normocephalic and atraumatic.  Eyes: EOM are normal. Pupils are equal, round, and reactive to light.  Neck: Normal range of motion. Neck supple.  Cardiovascular: Normal rate, regular rhythm and normal heart sounds.   No murmur heard. Pulmonary/Chest: Effort normal and breath sounds normal.       Mildly harsh breath sounds.  Abdominal: Soft. Bowel sounds are normal. He exhibits no distension and no mass. There is no tenderness. There is no rebound and no guarding.  Musculoskeletal: Normal range of motion. He exhibits  edema.       Trace edema on left lower leg.  Neurological: He is alert and oriented to person, place, and time. No cranial nerve deficit.  Skin: Skin is warm and dry.  Psychiatric: He has a normal mood and affect.    ED Course  Procedures (including critical care time)  Labs Reviewed  CBC WITH DIFFERENTIAL - Abnormal; Notable for the following:    WBC 18.9 (*)     RBC 3.73 (*)     Hemoglobin 9.9 (*)     HCT 27.7 (*)     MCV 74.3 (*)     RDW 17.8 (*)     nRBC 4 (*)     Neutro Abs 11.7 (*)     Lymphs Abs 4.5 (*)     Monocytes Absolute 1.7 (*)     Eosinophils Absolute 0.8 (*)     Basophils Absolute 0.2 (*)     All other components within normal limits  BASIC METABOLIC PANEL - Abnormal; Notable for the following:    Glucose, Bld 178 (*)     BUN 30 (*)     Creatinine, Ser 2.20 (*)     GFR calc non Af Amer 34 (*)     GFR calc Af Amer 39 (*)     All other components within normal limits  D-DIMER, QUANTITATIVE - Abnormal; Notable for the following:    D-Dimer, Quant 3.29 (*)     All other components within normal limits  PRO B NATRIURETIC PEPTIDE - Abnormal; Notable for the following:    Pro B Natriuretic peptide (BNP) 494.7 (*)     All other components within normal limits  PROTIME-INR   Dg Chest 2 View  10/04/2012  *RADIOLOGY REPORT*  Clinical Data: Shortness of breath.  CHEST - 2 VIEW  Comparison: 02/17/2012  Findings: The lungs are clear without focal infiltrate, edema, pneumothorax or pleural effusion. Interstitial markings are diffusely coarsened with chronic features.  Vascular congestion noted without airspace pulmonary edema. Cardiopericardial silhouette is at upper limits of normal for size. Telemetry leads overlie the chest.  IMPRESSION:  Vascular congestion with probable underlying chronic interstitial coarsening.  No overt pulmonary edema or focal lung consolidation.   Original Report Authenticated By: Misty Stanley, M.D.    Nm Pulmonary Perf And Vent  10/04/2012   *RADIOLOGY REPORT*  Clinical Data:  47 year old male with shortness of breath.  History of pulmonary emboli.  NUCLEAR MEDICINE VENTILATION - PERFUSION LUNG SCAN  Technique:  Ventilation images were obtained in multiple projections using inhaled aerosol technetium 99  M DTPA.  Perfusion images were obtained in multiple projections after intravenous injection of Tc-67mMAA.  Radiopharmaceuticals:  445m Tc-9948mPA aerosol and 5.0 mCi Tc-14m29m.  Comparison: 07/18/2011 VQ study.  Findings:  Ventilation:   No focal ventilation defect.  Perfusion:  Minimal patchy peripheral perfusion defects are noted bilaterally with a larger right mid lung defect, all which ventilate normally. These perfusion defects are decreased in size since the prior study, likely chronic changes.  IMPRESSION: Patchy bilateral peripheral perfusion defects, with the largest in the right mid lung, which ventilate normally.  These same defects were present on the prior study, but are now decreased in size and most likely represent chronic changes.   Original Report Authenticated By: JeffMargarette CanadaD.      1. Dyspnea       MDM  Patient transferred with dyspnea. Sent for VQ scan, which showed only chronic changes. There were some defects in the same spot as previous, however the size is decreased. He has a mildly elevated BNP. Her x-ray shows vascular congestion. He'll be started on a small dose of Lasix and will followup with his primary care Dr.        NathJasper RilingckAlvino Chapel 10/04/12 2253

## 2012-10-04 NOTE — ED Notes (Signed)
Patient back from NM scan; currently sitting up in bed; no respiratory or acute distress noted.  Patient updated on plan of care; informed patient that we are currently waiting on Dr. Alvino Chapel to come in and talk about test results.  Denies any needs at this time; will continue to monitor.

## 2012-10-04 NOTE — ED Notes (Signed)
Pt c/o SOB x 2 days  HX PE  Sent here for eval by PMD

## 2012-10-23 ENCOUNTER — Other Ambulatory Visit: Payer: Self-pay

## 2012-10-27 ENCOUNTER — Other Ambulatory Visit (HOSPITAL_COMMUNITY): Payer: Self-pay | Admitting: Cardiology

## 2012-10-27 DIAGNOSIS — R079 Chest pain, unspecified: Secondary | ICD-10-CM

## 2012-10-28 ENCOUNTER — Other Ambulatory Visit (HOSPITAL_COMMUNITY): Payer: Self-pay | Admitting: Cardiology

## 2012-10-28 DIAGNOSIS — R079 Chest pain, unspecified: Secondary | ICD-10-CM

## 2012-11-02 ENCOUNTER — Encounter (HOSPITAL_COMMUNITY)
Admission: RE | Admit: 2012-11-02 | Discharge: 2012-11-02 | Disposition: A | Discharge status: Home / Self Care | Payer: 59 | Source: Ambulatory Visit | Attending: Cardiology | Admitting: Cardiology

## 2012-11-02 DIAGNOSIS — R079 Chest pain, unspecified: Secondary | ICD-10-CM | POA: Insufficient documentation

## 2012-11-02 MED ORDER — REGADENOSON 0.4 MG/5ML IV SOLN
0.4000 mg | Freq: Once | INTRAVENOUS | Status: AC
  Administered 2012-11-02: 0.4 mg via INTRAVENOUS

## 2012-11-02 MED ORDER — TECHNETIUM TC 99M SESTAMIBI GENERIC - CARDIOLITE
30.0000 | Freq: Once | INTRAVENOUS | Status: AC | PRN
  Administered 2012-11-02: 30 via INTRAVENOUS

## 2012-11-02 MED ORDER — TECHNETIUM TC 99M SESTAMIBI GENERIC - CARDIOLITE
10.0000 | Freq: Once | INTRAVENOUS | Status: AC | PRN
  Administered 2012-11-02: 10 via INTRAVENOUS

## 2012-11-02 MED ORDER — REGADENOSON 0.4 MG/5ML IV SOLN
INTRAVENOUS | Status: AC
  Administered 2012-11-02: 0.4 mg via INTRAVENOUS
  Filled 2012-11-02: qty 5

## 2012-11-05 ENCOUNTER — Ambulatory Visit (HOSPITAL_COMMUNITY)
Admission: RE | Admit: 2012-11-05 | Discharge: 2012-11-05 | Disposition: A | Discharge status: Home / Self Care | Payer: 59 | Source: Ambulatory Visit | Attending: Cardiology | Admitting: Cardiology

## 2012-11-05 DIAGNOSIS — R079 Chest pain, unspecified: Secondary | ICD-10-CM | POA: Insufficient documentation

## 2012-11-05 DIAGNOSIS — R072 Precordial pain: Secondary | ICD-10-CM

## 2012-11-05 NOTE — Progress Notes (Signed)
  Echocardiogram 2D Echocardiogram has been performed.  Isaiah [name] 11/05/2012, 12:01 PM

## 2012-11-09 ENCOUNTER — Ambulatory Visit (HOSPITAL_COMMUNITY): Payer: 59

## 2012-11-11 NOTE — Progress Notes (Signed)
Received call from wife who requests followup appointment with Dr. Alen Blew to review lab results patient recently had drawn from Cardiologist, Wallene Huh; requested labs and office notes to be faxed to our office.

## 2012-11-12 ENCOUNTER — Ambulatory Visit (INDEPENDENT_AMBULATORY_CARE_PROVIDER_SITE_OTHER): Payer: Self-pay | Admitting: Family Medicine

## 2012-11-12 DIAGNOSIS — E119 Type 2 diabetes mellitus without complications: Secondary | ICD-10-CM

## 2012-11-12 NOTE — Progress Notes (Signed)
Late entry. Visit occurred 09/17/2012.  Patient presents today for 3 month DM follow-up as part of the employer-sponsored Link to Wellness program. Medications, insulin regimen, and glucose readings have been reviewed. I have also discussed with patient lifestyle interventions such as diet and physical activity. Details of this visit can be found in [place] documenting program through [facility]). Patient has set a series of personal goals and will follow-up in 3 months for further review of DM.

## 2012-11-23 ENCOUNTER — Ambulatory Visit (INDEPENDENT_AMBULATORY_CARE_PROVIDER_SITE_OTHER): Payer: 59 | Admitting: Ophthalmology

## 2012-11-23 DIAGNOSIS — E11359 Type 2 diabetes mellitus with proliferative diabetic retinopathy without macular edema: Secondary | ICD-10-CM

## 2012-11-23 DIAGNOSIS — E1139 Type 2 diabetes mellitus with other diabetic ophthalmic complication: Secondary | ICD-10-CM

## 2012-11-23 DIAGNOSIS — I10 Essential (primary) hypertension: Secondary | ICD-10-CM

## 2012-11-23 DIAGNOSIS — H35039 Hypertensive retinopathy, unspecified eye: Secondary | ICD-10-CM

## 2012-11-23 DIAGNOSIS — H2510 Age-related nuclear cataract, unspecified eye: Secondary | ICD-10-CM

## 2012-11-23 DIAGNOSIS — H43819 Vitreous degeneration, unspecified eye: Secondary | ICD-10-CM

## 2012-11-29 ENCOUNTER — Encounter: Payer: Self-pay | Admitting: Oncology

## 2012-12-07 NOTE — Progress Notes (Signed)
Patient ID: [facility], male   DOB: Oct 27, 1965, 47 y.o.   MRN: [id number] ATTENDING PHYSICIAN NOTE: I have reviewed the chart and agree with the plan as detailed above. Dorcas Mcmurray MD Pager [phone]

## 2012-12-31 ENCOUNTER — Ambulatory Visit (INDEPENDENT_AMBULATORY_CARE_PROVIDER_SITE_OTHER): Payer: Self-pay | Admitting: Family Medicine

## 2012-12-31 VITALS — BP 143/88 | HR 77 | Ht 69.0 in | Wt 279.8 lb

## 2012-12-31 DIAGNOSIS — E119 Type 2 diabetes mellitus without complications: Secondary | ICD-10-CM

## 2013-01-03 NOTE — Progress Notes (Signed)
Established Patient - Follow-Up Evaluation  Link To Wellness Class - Clinical Pharmacist  Care Team Member: [name]     Subjective:  Patient presents today for 3 month diabetes follow-up as part of the employer-sponsored Link to Wellness program. Current diabetes regimen includes Lantus 52 units SQ once daily & Humalog sliding scale with meals. Patient also continues on daily ASA, ARB and statin.   Medication changes since his last appointment- he was started on atorvastatin 10 mg and furosemide 20 mg. At his last visit he had recently had an ED visit for shortness of breath. He had a cardiac non stress test and nuclear imaging which showed no active CV disease.   He has been diagnosed with sleep apnea and is waiting on a cpap machine. He states the he really doesn't want to start wearing it though so he hasn't followed up with his referral.   He thinks his last A1C was 6.1. Dr. [name] checked it a couple of months ago. I will fax to his office to get results.    Disease Assessments:  Diabetes:  Type of Diabetes: Type 2; Year of diagnosis 1993; MD managing Diabetes Dr. [name]; Sees Diabetes provider 2 times per year; uses glucometer; takes medications as prescribed; Diabetes Education yes; checks feet daily checks feet monthy; takes an aspirin a day;   checks blood glucose once daily; Highest CBG 210; Lowest CBG 71; hypoglycemia frequency rarely;  7 day CBG average 136; 14 day CBG average 130; 30 day CBG average 134;   Other Diabetes History: He is checking his blood sugar once daily in the morning.   He states that the high readings around 200 were when he was eating a cheesecake.     Social History:  Exercise adherence 1-2 days a week; Denies alcohol use; Denies caffeine use; Diet adherence 25-50% of the time says he is better at watching his carbs, but has not made any significant changes in his diet, though he notes not being as hungry lately.; Medication adherence adherent;    Patient cannot afford medications does not want to try and switch diovan to losartan because he feels like the diovan has been working well for him. Without the help of the LTW program he would not be able to afford his medications.;   120 minutes of exercise per week.  Occupation: Unemployed   Physical Activity- walking twice a week at the mall (can't walk outside due to allergies) for 60 minutes. He is also doing yard work.   Nutrition- No major changes since last time.   24 hour recall B- nothing (this is common as he gets up later in the day) L- spaghetti, meat sauce D- 2 pieces of chicken strips (breaded and fried) on a sandwich, lettuce, tomatoes, mayo.   snack after dinner- chips  Beverages- mainly water  He states that he has been trying to do better with eating something for breakfast. Sometimes he eats a box of cereal (fruit loops, cheerios)  He states that this is a typical day for the past month or so.  Dilated Eye Exam: 12/03/2012  Other Preventive Care Notes: Dental Visit- January 2014  .    Assessment/Plan:  Patient is a 47 year old male with DM2. I am unable to assess his glycemic control as I have not yet heard back from Dr. [name] office for his A1C result.   Patient remains overweight. He is unemployed and does not have a normal sleep pattern or schedule. Despite  having the time to exercise and expressing the desire to lose weight, he does not take the action neccessary to complete physical activity. From talking to him it appears that he spends a portion of his day asleep on the couch. I tried to brainstorm with him to help him develop a plan for physical activity and he thinks that he can start to jump rope again. He seemed unwilling to stop by the local [employer] to walk around the store (he complains he can't walk outside due to the pollen).   Snacking, especially on sweets and late at night, are a problem for him. I encouraged him to only snack on fruits  and vegetables and to stop eating after dinner. He is not eating breakfast either and is frequently taking a long morning nap. I encouraged him to start eating breakfast, even if it is something little.   He has not had a refill on his Humalog pens since July 2013. I addressed this with him and he swears he has several pens in the fridge. He is either not taking the insulin or is using the pens past the expiration date. I reminded him that once he starts using each pen it has an expiration date of 28 days. I tried to refill the Humalog today but he declined.   Follow up with patient regarding Humalog use and A1C result. Will see patient back in 3 months..    Goals for Next Visit-  1. Check the dates on your Humalog syringes. If they are out of date, throw them away and call for a refill. Remember that once you start using the pen, throw it away after 30 days.  2. Weight Loss through meal planning. Increase fruits and less chips. When you want to reach for a snack, choose fruit or vegetable.  3. Start jump roping again- 5 minutes every day. Continue walking at least twice a week, try to get three times a week.   Next appointment with me is Friday July 25th at 11:30 AM.    T[place]ett [place]. Donneta Romberg, PharmD, BCPS

## 2013-01-10 NOTE — Progress Notes (Signed)
Patient ID: [facility], male   DOB: 1966-01-15, 47 y.o.   MRN: [id number] ATTENDING PHYSICIAN NOTE: I have reviewed the chart and agree with the plan as detailed above. Dorcas Mcmurray MD Pager [phone]

## 2013-04-01 ENCOUNTER — Ambulatory Visit (INDEPENDENT_AMBULATORY_CARE_PROVIDER_SITE_OTHER): Payer: Self-pay | Admitting: Family Medicine

## 2013-04-01 VITALS — BP 138/84 | HR 80 | Ht 69.0 in | Wt 277.6 lb

## 2013-04-01 DIAGNOSIS — E119 Type 2 diabetes mellitus without complications: Secondary | ICD-10-CM

## 2013-04-01 NOTE — Progress Notes (Signed)
Subjective:  Patient presents today for 3 month diabetes follow-up as part of the employer-sponsored Link to Wellness program. Current diabetes regimen includes Lantus 45 units once daily, Humalog 10 units with meals plus sliding scale for CBG>200. Patient also continues on daily ASA and statin. Most recent MD follow-up was in May with Dr. [name]. Patient has a pending appt with Dr. [name] in early fall. At his last appointment with Dr. [name] his blood pressure medication was changed from Diovan to Amlodipine 10 mg daily. It was changed because his potassium was elevated.    Disease Assessments:  Diabetes:  Type of Diabetes: Type 2; Year of diagnosis 1993; MD managing Diabetes Dr. [name]; Sees Diabetes provider 2 times per year; uses glucometer; takes medications as prescribed; Diabetes Education yes; checks feet daily checks feet monthy; takes an aspirin a day; hypoglycemia frequency rarely; checks blood glucose 2 times a day;   14 day CBG average 133; 30 day CBG average 146; 7 day CBG average 134; Highest CBG 241; Lowest CBG 70;   Other Diabetes History: He states he is checking his blood sugar twice daily- once fasting and the second is right before dinner. Sometimes he checks his blood sugar again at bedtime.   Denies hypoglycemia.   30 day average is higher- he states that this is because of the 4th of July weekend. He admits to increased eating Thursday - Sunday of the weekend.    Physical Activity- He is walking or riding a stationary bike at least two days a week. He is spending 45 minutes each time he exercises. He states that his allergies haven't been as bad and it's been easier to spend the time outside.   Nutrition- he states there are no big changes to his diet from last time.   Typical day  B- cereal (either Corn Flakes or cheerios), regular bowl with 2% milk, drinks water. Denies juice.   L- Sandwich- [place] and cheese on whole wheat, lettuce tomato, bag of chips  (he states he has this at least 2 days a week). Soda- he is drinking a regular soda- 8 oz of Fanta a couple of days a week. He states that usually he is drinking water or Crystal Light.   D- beef rounds, collard greens, yellow rice.   He states that he is not eating desserts very often.   He is snacking in the evenings after dinner mostly- chips and pretzels.   He states that he is eating peaches and watermelons. He states that he is eating maybe 2 servings or fruits a day, probably 3 servings of vegetables.     Dilated Eye Exam: 12/03/2012  Other Preventive Care Notes: Dental Visit- January 2014    Vital Signs:  04/01/2013 12:03 PM (EST) BMI 41.0; Height 5 ft 9 in; Weight 277.6 lbs     Assessment/Plan:  Patient is a 47 year old male with DM2. I do not have his most current A1C. Dr. [name] checked it in May, but patient does not remember what the result was. I will fax her office and request a copy of his most recent labs. I attempted to get labs following his last visit, but was unable to get the MD office to send me a copy of the labs. Based on his averages from his meter he is likely close to A1C goal of less than 7.0%.   Patient has lost an additional 2 pounds since his last appointment with me. He is more consistent with physical activity  and has increased duration to 45 minutes. He is still just exercising twice a week. I challenged him to add an extra day of physical activity each week. I explained that increasing physical activity would further help him to lose weight. Overall he has lost close to 15 pounds since May 2013.   Patient refilled Lantus and Humalog today (this was a source of concern at his last appointment since the refill was many months overdue).   A 24 hour food recall shows that patient is snacking often, and usually on chips or pretzels. He is not eating very many servings of fruits and vegetables. I encouraged him to snack on a piece of fruit or a vegetable  instead of the chips.   Follow up with patient in 3 months..    Goals for Next Visit-  1. Look at the nutrition information on the chips and pretzel bag and see how many chips are in a serving size. When you are having a snack or eating chips at lunch, try to only eat 1 serving size. 2. When you are looking for a snack- reach for a piece of fruit or vegetables.  3. Increase bike riding to three days a week.  4. At your next appointment bring a comfortable pair of shoes to walk in- we will have a walking visit.   Next appointment with me is Friday October 24th at 10:30 AM.

## 2013-04-12 NOTE — Progress Notes (Signed)
Patient ID: [facility], male   DOB: Aug 17, 1966, 47 y.o.   MRN: [id number] ATTENDING PHYSICIAN NOTE: I have reviewed the chart and agree with the plan as detailed above. Dorcas Mcmurray MD Pager [phone]

## 2013-05-25 ENCOUNTER — Ambulatory Visit (INDEPENDENT_AMBULATORY_CARE_PROVIDER_SITE_OTHER): Payer: Self-pay | Admitting: Ophthalmology

## 2013-05-26 ENCOUNTER — Ambulatory Visit (INDEPENDENT_AMBULATORY_CARE_PROVIDER_SITE_OTHER): Payer: Self-pay | Admitting: Ophthalmology

## 2013-07-14 ENCOUNTER — Other Ambulatory Visit: Payer: Self-pay

## 2013-07-22 ENCOUNTER — Ambulatory Visit (INDEPENDENT_AMBULATORY_CARE_PROVIDER_SITE_OTHER): Payer: Self-pay | Admitting: Family Medicine

## 2013-07-22 VITALS — BP 147/89 | HR 83 | Ht 70.0 in | Wt 275.4 lb

## 2013-07-22 DIAGNOSIS — E119 Type 2 diabetes mellitus without complications: Secondary | ICD-10-CM

## 2013-07-22 NOTE — Progress Notes (Signed)
Subjective:  Patient presents today for 3 month diabetes follow-up as part of the employer-sponsored Link to Wellness program. Current diabetes regimen includes Lantus 40 units SQ once daily in the morning, Humalog 10-15 units SQ with each meal. Patient also continues on daily ASA and statin.   Last appointment with Dr. [name] was earlier this week. No changes to medications. No changes in his health since his last appointment.   Dr. Florene Glen at his last appointment changed furosemide to be 80 mg once daily.    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    Disease Assessments:  Diabetes:  Type of Diabetes: Type 2; Year of diagnosis 1993; MD managing Diabetes Dr. [name]; Sees Diabetes provider 2 times per year; uses glucometer; takes medications as prescribed; Diabetes Education yes; takes an aspirin a day; hypoglycemia frequency rarely; checks blood glucose 2 times a day; checks feet daily;   7 day CBG average 121; 14 day CBG average 108; 30 day CBG average 116; Highest CBG 174; Lowest CBG 56; Other Diabetes History: He states he is checking 2-3 times daily. Normally checking fasting, sometimes before lunch, always before dinner. He has had one episode of hypoglycemia. He ate saltine crackers to get his CBG back.   Averages are about 10-20 points lower than they were last time.     Physical Activity-   He is walking as part of his job- he is walking in the hallways of schools. He is also taking the stairs.   Nutrition- He states that now he is working full time his lunches have changed. He is bringing his lunch most of the time.   B- honey nut cheerios, mandarin oranges, milk (in the mornings he is usually taking 8 units of Humalog)  sometimes a snack in the morning- apples  L- [place] sandwich or ham sandwich with L,T mustard, pickles; 1/2 bag of chips; sometimes juice. [he states that normally before  lunch he is 110 and doesn't take any insulin]  D- fish, vegetables, frozen string beans with potatoes, corn on the cob, sometimes pork chops, beef cutlet; white rice, sometimes a slice of whole wheat bread. [normally takes insulin with dinner- averages around 10-12 units]        Hemoglobin A1c: 07/04/2013    Dilated Eye Exam: 12/03/2012  Flu vaccine: 07/04/2013  Other Preventive Care Notes: Dental Visit- due in November 2014       Vital Signs:  07/22/2013 10:26 AM (EST) Blood Pressure 147 / 89 mm/HgBMI 39.5; Height 5 ft 10 in; Pulse Rate 83 bpm; Weight 275.4 lbs        Assessment/Plan: Patient is a 47 year old male with DM2 who is managed currently with insulin. Patient recently had A1C checked with Dr. [name]. He does not remember what the result was. He states that he will email me with his results. I have tried a few times to get results from Dr. Kaleen Mask office and have had little success. I will try again today but I am not hopeful I will hear anything back.   Patient appears to be doing better than he was at his last appointment with me. 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 He reports that he is getting more physical activity because he is walking around schools and the campus grounds. More importantly, he now has a regular  routine and schedule during the day. This has cut back on the snacking and TV watching he was doing previously when he was not working. Both the increased physical activity and snack limitation have led to a slight decrease in weight (2 pounds). CBGS are also about 10-20 points lower than they were at his last appointment with me.   Follow up with patient in 3 months..    Goals for Next Visit-  1. Ride your bike at least three times a week. 2. Continue using the smallest ziploc bags to put your chips and pretzels in at lunch.  3. Reach for a fruit for a snack.  4. Increase  walking- try looking for a pedometer. Aim for 10,000 steps.  Next appointment to see me is Friday February 6th at 4 PM.

## 2013-09-26 ENCOUNTER — Ambulatory Visit (INDEPENDENT_AMBULATORY_CARE_PROVIDER_SITE_OTHER): Payer: 59 | Admitting: Ophthalmology

## 2013-09-26 DIAGNOSIS — E11359 Type 2 diabetes mellitus with proliferative diabetic retinopathy without macular edema: Secondary | ICD-10-CM

## 2013-09-26 DIAGNOSIS — E1065 Type 1 diabetes mellitus with hyperglycemia: Secondary | ICD-10-CM

## 2013-09-26 DIAGNOSIS — E1039 Type 1 diabetes mellitus with other diabetic ophthalmic complication: Secondary | ICD-10-CM

## 2013-09-26 DIAGNOSIS — I10 Essential (primary) hypertension: Secondary | ICD-10-CM

## 2013-09-26 DIAGNOSIS — H43819 Vitreous degeneration, unspecified eye: Secondary | ICD-10-CM

## 2013-09-26 DIAGNOSIS — H2510 Age-related nuclear cataract, unspecified eye: Secondary | ICD-10-CM

## 2013-09-26 DIAGNOSIS — H35039 Hypertensive retinopathy, unspecified eye: Secondary | ICD-10-CM

## 2013-09-26 DIAGNOSIS — H3520 Other non-diabetic proliferative retinopathy, unspecified eye: Secondary | ICD-10-CM

## 2013-11-01 ENCOUNTER — Ambulatory Visit (INDEPENDENT_AMBULATORY_CARE_PROVIDER_SITE_OTHER): Payer: Self-pay | Admitting: Family Medicine

## 2013-11-01 VITALS — BP 142/89 | HR 80 | Wt 272.0 lb

## 2013-11-01 DIAGNOSIS — E119 Type 2 diabetes mellitus without complications: Secondary | ICD-10-CM

## 2013-11-01 NOTE — Progress Notes (Signed)
Patient presents for 3 mo f/u DM as part of the employee sponsored Link to [employer]. Medications, glucose readings, and insulin regimen have been reviewed. I have also discussed with patient lifestyle interventions such as diet and exercise. Full documentation of this visit can be found in the [employer] documenting system through [facility]). However specifics from this visit include the following:  Diabetes Mellitus: unable to draw A1C. Patient states he has sickle cell disease which will show an innacurate A1C. Blood sugar averages are all in range except for just a few highs (not frequently at all). He did have 1 low of 63. plan 1.) use http://vang.com/ to track calories. put in a 1lb per week weight loss goal. 2.) fax A1C results from MD office to me. I'm assuming they are drawing some type of fructosamine instead since A1C is not reliable but I'll have to see what the MD is using. He did say his nephrologist was the one drawing something. 3.) f/u 3 mo  Hyperlipidemia: at moderate intensity lipitor, no recommended changes.  Hypertension: BP slightly elevated. Instructed patient to monitor @ home. Will call in a couple weeks to assess control and contact MD prn. Already on max dose amlodipine.  Cost Savings Intervention Outcomes: Medical problem identified  Patient has set a series of personal goals and will f/u in 3 mo for further review of DM.

## 2013-12-29 NOTE — Progress Notes (Signed)
Patient ID: [facility], male   DOB: [birth year], 48 y.o.   MRN: [id number] ATTENDING PHYSICIAN NOTE: I have reviewed the chart and agree with the plan as detailed above. Dorcas Mcmurray MD Pager [phone]

## 2014-02-06 ENCOUNTER — Ambulatory Visit (INDEPENDENT_AMBULATORY_CARE_PROVIDER_SITE_OTHER): Payer: Self-pay | Admitting: Family Medicine

## 2014-02-06 VITALS — BP 138/86 | HR 84 | Wt 265.0 lb

## 2014-02-06 DIAGNOSIS — E119 Type 2 diabetes mellitus without complications: Secondary | ICD-10-CM

## 2014-02-06 NOTE — Progress Notes (Signed)
Patient presents for 3 mo f/u DM as part of the employee sponsored Link to [employer]. Medications have been reviewed. I have also discussed with patient lifestyle interventions such as diet and exercise. Full documentation of this visit can be found in the [employer] documenting system through [facility]). However specifics from this visit include the following:  Diabetes Mellitus:  Did not draw A1C (pt has sickle cell). Blood glucose averages are within range. They have creeped up slowly over the last month, however, because he's not really covering his lunch. Previously he had been eating 2 meals but now he is eating lunch and not covering it. He does not currently exercise but has lost 7 lb since last visit. plan 1.) begin using myfitnesspal to track calories. Goal weight in 3 mo 255 lb. 2.) Since lunch is a smaller meal but he's not covering it at all, may at least want to give 5 units if eating something carb heavy. Patient is not interested in carb counting, however, but did advise to try a smaller dose just so their is some coverage. 3.) f/u 3 mo  Hyperlipidemia:  on appropriately dosed statin. It was 2 months past due so I filled it today. Reiterated importance of taking everyday.  Hypertension: at goal on amlodipine   Plan Items:  Physical Activity Counseling   Patient has set a series of personal goals and will f/u in 3 mo for further review of DM.

## 2014-03-30 NOTE — Progress Notes (Signed)
Patient ID: [facility], male   DOB: June 20, 1966, 48 y.o.   MRN: [id number] ATTENDING PHYSICIAN NOTE: I have reviewed the chart and agree with the plan as detailed above. Dorcas Mcmurray MD Pager [phone]

## 2014-05-17 ENCOUNTER — Encounter: Payer: Self-pay | Admitting: Family Medicine

## 2014-05-17 NOTE — Progress Notes (Signed)
Patient ID: [facility], male   DOB: October 11, 1965, 48 y.o.   MRN: [id number] Reviewed: Agree with the documentation and management of our [facility] pharmacologist.

## 2014-06-19 ENCOUNTER — Ambulatory Visit (INDEPENDENT_AMBULATORY_CARE_PROVIDER_SITE_OTHER): Payer: Self-pay | Admitting: Family Medicine

## 2014-06-19 VITALS — BP 144/107 | HR 81 | Wt 264.0 lb

## 2014-06-19 DIAGNOSIS — E119 Type 2 diabetes mellitus without complications: Secondary | ICD-10-CM

## 2014-06-19 NOTE — Progress Notes (Signed)
Patient presents for 3 mo f/u DM as part of the employee sponsored Link to [employer]. Medications have been reviewed. I have also discussed with patient lifestyle interventions such as diet and exercise. Full documentation of this visit can be found in the [employer] documenting system through [facility]). However specifics from this visit include the following:  Diabetes Mellitus:   Did not draw A1C (patient has sickle cell trait). He gets something drawn through his kidney doctor (I'm assuming a fructosamine level) will try and get results from MD. Patient did not bring meter to visit for me to assess glucose control. plan 1.) patient will go home and email me blood sugar averages for 7, 14, and 30 days. Will also provide me results for the last 2 weeks of individual checks. 2.) will also give me results from MD test 3.) refilled his lantus and Humalog today (not filled since February). This is another reason I want to see his blood sugars. He doesn't seem compliant with meds looking at refill history. Hyperlipidemia: on appropriately dosed lipitor  Hypertension: BP elevated, however amlodipine was 115 days past due. I filled it today. Patient states he did not miss any doses but his refill history shows otherwise. Reiterated importance of taking everyday. Patient will monitor at home and I will call him in 2 weeks.  Patient has set a series of personal goals and will follow in 3 months for further review of DM

## 2014-06-26 ENCOUNTER — Ambulatory Visit (INDEPENDENT_AMBULATORY_CARE_PROVIDER_SITE_OTHER): Payer: 59 | Admitting: Ophthalmology

## 2014-06-26 DIAGNOSIS — E10319 Type 1 diabetes mellitus with unspecified diabetic retinopathy without macular edema: Secondary | ICD-10-CM

## 2014-06-26 DIAGNOSIS — H35033 Hypertensive retinopathy, bilateral: Secondary | ICD-10-CM

## 2014-06-26 DIAGNOSIS — E10359 Type 1 diabetes mellitus with proliferative diabetic retinopathy without macular edema: Secondary | ICD-10-CM

## 2014-06-26 DIAGNOSIS — H3523 Other non-diabetic proliferative retinopathy, bilateral: Secondary | ICD-10-CM

## 2014-06-26 DIAGNOSIS — H43812 Vitreous degeneration, left eye: Secondary | ICD-10-CM

## 2014-06-26 DIAGNOSIS — I10 Essential (primary) hypertension: Secondary | ICD-10-CM

## 2014-07-25 NOTE — Progress Notes (Signed)
Patient ID: [facility], male   DOB: 11-21-1965, 48 y.o.   MRN: [id number] Reviewed: Agree with the documentation and management of our [place].

## 2014-09-27 ENCOUNTER — Other Ambulatory Visit: Payer: Self-pay | Admitting: Allergy and Immunology

## 2014-09-27 ENCOUNTER — Emergency Department (HOSPITAL_COMMUNITY): Payer: 59

## 2014-09-27 ENCOUNTER — Encounter (HOSPITAL_COMMUNITY): Payer: Self-pay | Admitting: *Deleted

## 2014-09-27 ENCOUNTER — Encounter: Payer: Self-pay | Admitting: Physician Assistant

## 2014-09-27 ENCOUNTER — Ambulatory Visit
Admission: RE | Admit: 2014-09-27 | Discharge: 2014-09-27 | Disposition: A | Discharge status: Home / Self Care | Payer: 59 | Source: Ambulatory Visit | Attending: Allergy and Immunology | Admitting: Allergy and Immunology

## 2014-09-27 ENCOUNTER — Ambulatory Visit (INDEPENDENT_AMBULATORY_CARE_PROVIDER_SITE_OTHER): Payer: 59 | Admitting: Physician Assistant

## 2014-09-27 ENCOUNTER — Inpatient Hospital Stay (HOSPITAL_COMMUNITY)
Admission: EM | Admit: 2014-09-27 | Discharge: 2014-10-01 | DRG: 176 | Disposition: A | Discharge status: Home / Self Care | Payer: 59 | Attending: Internal Medicine | Admitting: Internal Medicine

## 2014-09-27 VITALS — BP 162/72 | HR 90 | Ht 70.0 in | Wt 261.0 lb

## 2014-09-27 DIAGNOSIS — R059 Cough, unspecified: Secondary | ICD-10-CM

## 2014-09-27 DIAGNOSIS — I10 Essential (primary) hypertension: Secondary | ICD-10-CM | POA: Diagnosis present

## 2014-09-27 DIAGNOSIS — M109 Gout, unspecified: Secondary | ICD-10-CM | POA: Diagnosis present

## 2014-09-27 DIAGNOSIS — E11649 Type 2 diabetes mellitus with hypoglycemia without coma: Secondary | ICD-10-CM | POA: Diagnosis present

## 2014-09-27 DIAGNOSIS — R7989 Other specified abnormal findings of blood chemistry: Secondary | ICD-10-CM

## 2014-09-27 DIAGNOSIS — N184 Chronic kidney disease, stage 4 (severe): Secondary | ICD-10-CM | POA: Diagnosis present

## 2014-09-27 DIAGNOSIS — N179 Acute kidney failure, unspecified: Secondary | ICD-10-CM | POA: Diagnosis present

## 2014-09-27 DIAGNOSIS — Z7982 Long term (current) use of aspirin: Secondary | ICD-10-CM | POA: Diagnosis not present

## 2014-09-27 DIAGNOSIS — R05 Cough: Secondary | ICD-10-CM

## 2014-09-27 DIAGNOSIS — R0600 Dyspnea, unspecified: Secondary | ICD-10-CM

## 2014-09-27 DIAGNOSIS — E669 Obesity, unspecified: Secondary | ICD-10-CM | POA: Diagnosis present

## 2014-09-27 DIAGNOSIS — Z86711 Personal history of pulmonary embolism: Secondary | ICD-10-CM | POA: Diagnosis not present

## 2014-09-27 DIAGNOSIS — Z832 Family history of diseases of the blood and blood-forming organs and certain disorders involving the immune mechanism: Secondary | ICD-10-CM | POA: Diagnosis not present

## 2014-09-27 DIAGNOSIS — Z794 Long term (current) use of insulin: Secondary | ICD-10-CM | POA: Diagnosis not present

## 2014-09-27 DIAGNOSIS — G4733 Obstructive sleep apnea (adult) (pediatric): Secondary | ICD-10-CM | POA: Diagnosis present

## 2014-09-27 DIAGNOSIS — D472 Monoclonal gammopathy: Secondary | ICD-10-CM | POA: Diagnosis present

## 2014-09-27 DIAGNOSIS — R0602 Shortness of breath: Secondary | ICD-10-CM

## 2014-09-27 DIAGNOSIS — Z6837 Body mass index (BMI) 37.0-37.9, adult: Secondary | ICD-10-CM | POA: Diagnosis not present

## 2014-09-27 DIAGNOSIS — R0609 Other forms of dyspnea: Secondary | ICD-10-CM

## 2014-09-27 DIAGNOSIS — E1129 Type 2 diabetes mellitus with other diabetic kidney complication: Secondary | ICD-10-CM

## 2014-09-27 DIAGNOSIS — R634 Abnormal weight loss: Secondary | ICD-10-CM

## 2014-09-27 DIAGNOSIS — D573 Sickle-cell trait: Secondary | ICD-10-CM | POA: Diagnosis present

## 2014-09-27 DIAGNOSIS — R0902 Hypoxemia: Secondary | ICD-10-CM | POA: Diagnosis present

## 2014-09-27 DIAGNOSIS — I2699 Other pulmonary embolism without acute cor pulmonale: Principal | ICD-10-CM | POA: Diagnosis present

## 2014-09-27 DIAGNOSIS — E1122 Type 2 diabetes mellitus with diabetic chronic kidney disease: Secondary | ICD-10-CM | POA: Diagnosis present

## 2014-09-27 DIAGNOSIS — N058 Unspecified nephritic syndrome with other morphologic changes: Secondary | ICD-10-CM

## 2014-09-27 DIAGNOSIS — D72829 Elevated white blood cell count, unspecified: Secondary | ICD-10-CM | POA: Diagnosis present

## 2014-09-27 DIAGNOSIS — N189 Chronic kidney disease, unspecified: Secondary | ICD-10-CM

## 2014-09-27 HISTORY — DX: Dependence on other enabling machines and devices: Z99.89

## 2014-09-27 HISTORY — DX: Dependence on other enabling machines and devices: G47.33

## 2014-09-27 HISTORY — DX: Unspecified asthma, uncomplicated: J45.909

## 2014-09-27 HISTORY — DX: Monoclonal gammopathy: D47.2

## 2014-09-27 HISTORY — DX: Anemia, unspecified: D64.9

## 2014-09-27 LAB — BASIC METABOLIC PANEL
Anion gap: 8 (ref 5–15)
BUN: 59 mg/dL — ABNORMAL HIGH (ref 6–23)
CO2: 16 mmol/L — ABNORMAL LOW (ref 19–32)
Calcium: 8.2 mg/dL — ABNORMAL LOW (ref 8.4–10.5)
Chloride: 115 mEq/L — ABNORMAL HIGH (ref 96–112)
Creatinine, Ser: 4.9 mg/dL — ABNORMAL HIGH (ref 0.50–1.35)
GFR calc Af Amer: 15 mL/min — ABNORMAL LOW (ref >90)
GFR calc non Af Amer: 13 mL/min — ABNORMAL LOW (ref >90)
Glucose, Bld: 186 mg/dL — ABNORMAL HIGH (ref 70–99)
Potassium: 5 mmol/L (ref 3.5–5.1)
Sodium: 139 mmol/L (ref 135–145)

## 2014-09-27 LAB — I-STAT TROPONIN, ED: Troponin i, poc: 0.02 ng/mL (ref 0.00–0.08)

## 2014-09-27 LAB — PROTIME-INR
INR: 1.17 (ref 0.00–1.49)
INR: 1.13 (ref 0.00–1.49)
Prothrombin Time: 15 seconds (ref 11.6–15.2)
Prothrombin Time: 14.7 seconds (ref 11.6–15.2)

## 2014-09-27 LAB — CBC
HCT: 22.7 % — ABNORMAL LOW (ref 39.0–52.0)
Hemoglobin: 8.2 g/dL — ABNORMAL LOW (ref 13.0–17.0)
MCH: 26.9 pg (ref 26.0–34.0)
MCHC: 36.1 g/dL — ABNORMAL HIGH (ref 30.0–36.0)
MCV: 74.4 fL — ABNORMAL LOW (ref 78.0–100.0)
Platelets: 329 10*3/uL (ref 150–400)
RBC: 3.05 MIL/uL — ABNORMAL LOW (ref 4.22–5.81)
RDW: 18.8 % — ABNORMAL HIGH (ref 11.5–15.5)
WBC: 17.8 10*3/uL — ABNORMAL HIGH (ref 4.0–10.5)

## 2014-09-27 LAB — BRAIN NATRIURETIC PEPTIDE: B Natriuretic Peptide: 77.8 pg/mL (ref 0.0–100.0)

## 2014-09-27 LAB — GLUCOSE, CAPILLARY: Glucose-Capillary: 156 mg/dL — ABNORMAL HIGH (ref 70–99)

## 2014-09-27 MED ORDER — INSULIN ASPART 100 UNIT/ML ~~LOC~~ SOLN
0.0000 [IU] | Freq: Three times a day (TID) | SUBCUTANEOUS | Status: DC
Start: 2014-09-28 — End: 2014-10-01
  Administered 2014-09-28: 1 [IU] via SUBCUTANEOUS

## 2014-09-27 MED ORDER — SODIUM CHLORIDE 0.9 % IV SOLN
INTRAVENOUS | Status: DC
  Administered 2014-09-27 – 2014-09-29 (×3): via INTRAVENOUS

## 2014-09-27 MED ORDER — SODIUM CHLORIDE 0.9 % IV BOLUS (SEPSIS)
1000.0000 mL | Freq: Once | INTRAVENOUS | Status: AC
  Administered 2014-09-27: 1000 mL via INTRAVENOUS

## 2014-09-27 MED ORDER — HEPARIN (PORCINE) IN NACL 100-0.45 UNIT/ML-% IJ SOLN
1500.0000 [IU]/h | INTRAMUSCULAR | Status: DC
  Administered 2014-09-27: 1750 [IU]/h via INTRAVENOUS
  Administered 2014-09-29: 1650 [IU]/h via INTRAVENOUS
  Administered 2014-09-30 – 2014-10-01 (×3): 1600 [IU]/h via INTRAVENOUS
  Filled 2014-09-27 (×11): qty 250

## 2014-09-27 MED ORDER — ACETAMINOPHEN 325 MG PO TABS: 650.0000 mg | ORAL_TABLET | Freq: Four times a day (QID) | ORAL | Status: DC | PRN

## 2014-09-27 MED ORDER — TECHNETIUM TC 99M DIETHYLENETRIAME-PENTAACETIC ACID: 40.0000 | Freq: Once | INTRAVENOUS | Status: DC | PRN

## 2014-09-27 MED ORDER — TECHNETIUM TO 99M ALBUMIN AGGREGATED
6.0000 | Freq: Once | INTRAVENOUS | Status: AC | PRN
  Administered 2014-09-27: 6 via INTRAVENOUS

## 2014-09-27 MED ORDER — AMLODIPINE BESYLATE 10 MG PO TABS
10.0000 mg | ORAL_TABLET | Freq: Every day | ORAL | Status: DC
  Administered 2014-09-28 – 2014-10-01 (×4): 10 mg via ORAL
  Filled 2014-09-27 (×4): qty 1

## 2014-09-27 MED ORDER — MORPHINE SULFATE 2 MG/ML IJ SOLN: 2.0000 mg | INTRAMUSCULAR | Status: DC | PRN

## 2014-09-27 MED ORDER — INSULIN GLARGINE 100 UNIT/ML ~~LOC~~ SOLN
40.0000 [IU] | Freq: Every day | SUBCUTANEOUS | Status: DC
  Administered 2014-09-27 – 2014-09-28 (×2): 40 [IU] via SUBCUTANEOUS
  Filled 2014-09-27 (×4): qty 0.4

## 2014-09-27 MED ORDER — HYDROCODONE-ACETAMINOPHEN 5-325 MG PO TABS: 1.0000 | ORAL_TABLET | ORAL | Status: DC | PRN

## 2014-09-27 MED ORDER — ONDANSETRON HCL 4 MG/2ML IJ SOLN
4.0000 mg | Freq: Four times a day (QID) | INTRAMUSCULAR | Status: DC | PRN
Start: 2014-09-27 — End: 2014-10-01

## 2014-09-27 MED ORDER — SODIUM CHLORIDE 0.9 % IJ SOLN
3.0000 mL | Freq: Two times a day (BID) | INTRAMUSCULAR | Status: DC
  Administered 2014-09-29: 3 mL via INTRAVENOUS

## 2014-09-27 MED ORDER — HEPARIN SODIUM (PORCINE) 5000 UNIT/ML IJ SOLN: 5000.0000 [IU] | Freq: Three times a day (TID) | INTRAMUSCULAR | Status: DC

## 2014-09-27 MED ORDER — ACETAMINOPHEN 650 MG RE SUPP: 650.0000 mg | Freq: Four times a day (QID) | RECTAL | Status: DC | PRN

## 2014-09-27 MED ORDER — WARFARIN - PHARMACIST DOSING INPATIENT: Freq: Every day | Status: DC

## 2014-09-27 MED ORDER — HEPARIN BOLUS VIA INFUSION
5000.0000 [IU] | Freq: Once | INTRAVENOUS | Status: AC
  Administered 2014-09-27: 5000 [IU] via INTRAVENOUS
  Filled 2014-09-27: qty 5000

## 2014-09-27 MED ORDER — ONDANSETRON HCL 4 MG PO TABS: 4.0000 mg | ORAL_TABLET | Freq: Four times a day (QID) | ORAL | Status: DC | PRN

## 2014-09-27 NOTE — Progress Notes (Addendum)
ANTICOAGULATION CONSULT NOTE - Initial Consult  Pharmacy Consult for Heparin Indication: Pulmonary Embolism  Allergies  Allergen Reactions  . Codeine Rash and Other (See Comments)    Unknown reaction (patient says it was more serious than just a rash, but he can't remember what happened)    Patient Measurements: Height: 5' 10"  (177.8 cm) Weight: 261 lb (118.389 kg) IBW/kg (Calculated) : 73 Heparin Dosing Weight: 99.4 kg  Vital Signs: Temp: 98.4 F (36.9 C) (01/20 1352) Temp Source: Oral (01/20 1352) BP: 137/75 mmHg (01/20 1615) Pulse Rate: 90 (01/20 1615)  Labs:  Recent Labs  09/27/14 1444  HGB 8.2*  HCT 22.7*  PLT 329  CREATININE 4.90*    Estimated Creatinine Clearance: 23.3 mL/min (by C-G formula based on Cr of 4.9).   Medical History: Past Medical History  Diagnosis Date  . Diabetes mellitus   . Hypertension   . Sickle cell-thalassemia disease   . Gout   . Pulmonary embolism 07/2011    on Coumadin  . Biopsychosocial stress experienced by infants at birth 2012    negative  bone marrow  biopsy.    Medications:   (Not in a hospital admission) Scheduled:   Infusions:    Assessment:  64 YOM w/ PMH of unprovoked bilateral PE '12, CKD, monoclonal gammopathy, anemia, sickle cell trait, and obesity presenting to [facility] on 09/27/14 c/o SOB on exertion.  Patient was seen by cardiology office on 09/27/14 and desatted to 83%, and recommended to be further evaluated in the ED.  Pharmacy has been consulted to dose heparin in the setting of PE.    Troponin neg 0.02, hgb low 8.2, hct low 22.7, plt wnl.  Baseline INR 1.17.  PT 15.0.  Scr 4.90, CrCl ~20-25 mL/min Goal of Therapy:  Heparin level 0.3-0.7 units/ml Monitor platelets by anticoagulation protocol: Yes   Plan:  - Heparin 5000 units IV bolus - Heparin 1750 units/hr IV infusion - Heparin level 8 hours after start of infusion - Daily CBC and HL - Monitor for signs and symptoms of bleeding  Hassie Bruce,  Pharm. D. Clinical Pharmacy Resident Pager: [phone] Ph: [phone] 09/27/2014 5:31 PM   Addendum: Noted pharmacy consult for coumadin to begin 1/21. Pt already with baseline INR. Will order daily INR beginning 1/22.  Sherlon Handing, PharmD, BCPS Clinical pharmacist, pager [phone] 09/27/2014 6:43 PM

## 2014-09-27 NOTE — ED Notes (Signed)
Pt reports going to allergist due to recent asthma diagnosis and cough, pt had low spo2 yesterday and sent for a chest xray. After the chest xray he was sent to a cardiologist who then sent him here. Pt has sob, denies cp, denies swelling to extremities, airway intact at triage.

## 2014-09-27 NOTE — Patient Instructions (Signed)
Your physician recommends that you continue on your current medications as directed. Please refer to the Current Medication list given to you today.   PROCEED TO ER

## 2014-09-27 NOTE — ED Provider Notes (Signed)
CSN: [id number]     Arrival date & time 09/27/14  1347 History   First MD Initiated Contact with Patient 09/27/14 1406     Chief Complaint  Patient presents with  . Shortness of Breath   *Concern for pt having 2 medical record numbers. MRN with cardiology- [id number].  MRN here- [id number].  Discussed with registration who will work on [facility] records.   (Consider location/radiation/quality/duration/timing/severity/associated sxs/prior Treatment) HPI Pt is a 49yo male with hx of HTN, IDDM type II, sickle cell-thalassemia disease, PE unprovoked in 2012 (no longer on coumadin or other blood thinner), presenting to ED from cardiology, sent by Melina Copa, PA-C cardiology with concern for possible recurrent PE.  Pt has had reported gradually worsening SOB on exertion over the last 6 months. Pt has been followed by an allergist for a persistent dry cough, dx with asthma 3 weeks ago, started on albuterol as needed and symbicort.  Yesterday, pt was found to have poor oxygenation, referred to cardiology today who found pt to be hypoxic, dropping to 83% with ambulation.     Past Medical History  Diagnosis Date  . Diabetes mellitus   . Hypertension   . Sickle cell-thalassemia disease   . Gout   . Pulmonary embolism 07/2011    on Coumadin  . Biopsychosocial stress experienced by infants at birth 2012    negative  bone marrow  biopsy.    Past Surgical History  Procedure Laterality Date  . Cholecystectomy    . Pars plana vitrectomy  02/17/2012    Procedure: PARS PLANA VITRECTOMY WITH 25 GAUGE;  Surgeon: Hayden Pedro, MD;  Location: [place];  Service: Ophthalmology;  Laterality: Right;   Family History  Problem Relation Age of Onset  . Sickle cell anemia Brother   . Diabetes type I Brother   . Kidney disease Brother    History  Substance Use Topics  . Smoking status: Never Smoker   . Smokeless tobacco: Not on file  . Alcohol Use: No    Review of Systems  Constitutional: Negative  for fever, chills and fatigue.  Respiratory: Positive for shortness of breath ( only on exersion).   Cardiovascular: Positive for chest pain (lower bilateral "rib muscle spasm"). Negative for palpitations.  Gastrointestinal: Negative for nausea, vomiting, abdominal pain and diarrhea.  All other systems reviewed and are negative.     Allergies  Codeine  Home Medications   Prior to Admission medications   Medication Sig Start Date End Date Taking? Authorizing Provider  amLODipine (NORVASC) 10 MG tablet Take 10 mg by mouth daily.    Historical Provider, MD  aspirin 81 MG chewable tablet Chew 81 mg by mouth daily.    Historical Provider, MD  atorvastatin (LIPITOR) 10 MG tablet Take 10 mg by mouth daily.    Historical Provider, MD  cetirizine (ZYRTEC) 10 MG tablet Take 10 mg by mouth daily as needed.     Historical Provider, MD  colchicine 0.6 MG tablet Take 0.6 mg by mouth daily as needed.    Historical Provider, MD  furosemide (LASIX) 20 MG tablet Take 20 mg by mouth daily as needed.  10/04/12   Jasper Riling. Pickering, MD  insulin glargine (LANTUS) 100 UNIT/ML injection Inject 40 Units into the skin at bedtime.     Historical Provider, MD  insulin lispro (HUMALOG) 100 UNIT/ML injection Inject 10 Units into the skin 2 (two) times daily before a meal. Sliding scale. Usually does between 10-12 units.    Historical  Provider, MD   BP 153/85 mmHg  Pulse 96  Temp(Src) 98.4 F (36.9 C) (Oral)  Resp 23  Ht 5' 10"  (1.778 m)  Wt 261 lb (118.389 kg)  BMI 37.45 kg/m2  SpO2 95% Physical Exam  Constitutional: He appears well-developed and well-nourished.  Obese male lying in exam bed, NAD.  HENT:  Head: Normocephalic and atraumatic.  Eyes: Conjunctivae are normal. No scleral icterus.  Neck: Normal range of motion.  Cardiovascular: Regular rhythm and normal heart sounds.  Tachycardia present.   Mild tachycardia, regular rhythm  Pulmonary/Chest: Effort normal and breath sounds normal. No  respiratory distress. He has no wheezes. He has no rales. He exhibits no tenderness.  No respiratory distress. Lungs: CTAB  Abdominal: Soft. Bowel sounds are normal. He exhibits no distension and no mass. There is no tenderness. There is no rebound and no guarding.  Musculoskeletal: Normal range of motion.  Neurological: He is alert.  Skin: Skin is warm and dry.  Nursing note and vitals reviewed.   ED Course  Procedures (including critical care time) Labs Review Labs Reviewed  CBC - Abnormal; Notable for the following:    WBC 17.8 (*)    RBC 3.05 (*)    Hemoglobin 8.2 (*)    HCT 22.7 (*)    MCV 74.4 (*)    MCHC 36.1 (*)    RDW 18.8 (*)    All other components within normal limits  BASIC METABOLIC PANEL - Abnormal; Notable for the following:    Chloride 115 (*)    CO2 16 (*)    Glucose, Bld 186 (*)    BUN 59 (*)    Creatinine, Ser 4.90 (*)    Calcium 8.2 (*)    GFR calc non Af Amer 13 (*)    GFR calc Af Amer 15 (*)    All other components within normal limits  BRAIN NATRIURETIC PEPTIDE  I-STAT TROPOININ, ED    Imaging Review No results found.   EKG Interpretation None      MDM   Final diagnoses:  SOB (shortness of breath)   Pt is a 49yo male presenting to ED from cardiology office as recommended by Melina Copa, PA-C for further evaluation and treatment of possible pneumonia vs PE. Hx of gradually worsening DOE.   2:09 PM Consulted With Melina Copa, PA-C cardiology who sent pt to ED for further evaluation of SOB and gradually worsening DOE.  Pt found to be hypoxic with O2 dropping to 83% on RA while ambulating in office.  Dayna reports pt has hx of unprovoked bilateral PE in 2012, hx of sickle cell-thalassemia as well as CKD with a Cr of 2.1.  CXR at office from allergist earlier this morning showed evidence concerning for airspace disease.  Dayna recommends pt be admitted to medicine service once workup completed. Cardiology will act as consult and f/u with pt in the  morning.     Pt is to have cardiac workup including labs, EKG, CXR, and V/Q scan to r/o PE. Labs: significant for elevated Cr of 4.9 up from 2.20 one year ago. Pt is not on dialysis.  Pt is followed by kidney center.  Leukocytosis also seen in labs, WBC- 17.8, however, hx of same with WBC 18.9 one year ago and 15.9 two years ago. Pt appears well, non-toxic. No respiratory distress. O2-94% on RA at rest.  4:03 PM Pt signed out to Margarita Mail, PA-C at shift change. Pt is to be admitted to medicine for acute  on chronic renal failure as well as possible pneumonia vs PE. F/u on CXR and V/Q scan.    Noland Fordyce, PA-C 09/27/14 1603  Orlie Dakin, MD 09/27/14 [phone]

## 2014-09-27 NOTE — ED Notes (Signed)
IV attempt x2 unsucessful

## 2014-09-27 NOTE — ED Notes (Signed)
Admitting MD at bedside.

## 2014-09-27 NOTE — Progress Notes (Signed)
Cardiology Office Note  Date:  09/27/2014   Patient ID:  [place], [name] 1966/06/18, MRN [id number]  PCP:  Maggie Font, MD  Cardiologist: Previously saw Dr. [name] - here to establish care with Dr. Acie Fredrickson NOTE: The patient has 2 MRNs (other one is [id number])   Chief Complaint: shortness of breath with exertion, low O2 sat  History of Present Illness:  Isaiah [name] is a 49 y.o. male with history of sickle cell trait, bilateral PE 07/2011, HTN, DM, OSA (uses CPAP), obesity (recently lost 25lb unintentionally), monoclonal gammopathy/anemia (saw heme in 2013), CKD (Cr 2.1 in 2014, no recent labs available) who is here for eval of DOE and decreased O2 sats. Per review of other MRN's chart, he had elevated troponins at time of his PEs in 2012 felt due to right heart strain. Cause of PE unknown. He says he was treated with 6 months of Coumadin. He was seen by Dr. [name] in 2014 (he states due to a murmur) at which time 2D echo 10/2012 showed mild LVH, EF 55-60%, grade 1 DD, interatrial septum is mobile, mildly dilated LA. Nuc 10/2012: fixed defect in anteroseptal wall most c/w infarct, no reversible ischemia, mild apical dyskinesia, LV dilitation, EF 47%. He denies prior cardiac cath.  He has recently been followed by his allergist for cough x 1 year. He was diagnosed with asthma and started on asthma medication. He followed up there yesterday and was noting exertional dyspnea. Apparently in their office he was hypoxic with ambulation into the 80's. CXR: subtle airspace disease at left base, suspcious for infection, atypical PNA would be a consideration. It was recommended that he follow up with a cardiologist, hence his appointment today. He notes for the past 6 months or more he's felt increasing DOE. He now gets SOB and has to rest after stairs. No SOB at rest. He denies any chest pain. No diaphoresis, nausea, vomiting, palpitations, near-syncope or syncope. He does endorse chronic low grade LEE  which is not different lately.  He ambulated in clinic today with O2 sat dropping to 83% on RA. He is 94% at rest. He denies acute dyspnea or CP but still continues to note DOE with higher levels of exertion. He does note a 25lb weight loss over 6 months with possibly less appetite but otherwise generally unintentional loss. No reported bleeding noted.  Past Medical History  Diagnosis Date  . Asthma   . Diabetes mellitus without complication   . Hypertension   . Gout   . Sickle cell-thalassemia disease     a. Sickle cell trait.  . Pulmonary embolism 07/2011    a. Tx with Coumadin for 6 months (unknown cause per patient).  . Hyperkalemia 07/2011  . Acute renal failure 07/2011  . Anemia   . Monoclonal gammopathy   . CKD (chronic kidney disease)   . Sleep apnea     a. uses CPAP.    Past Surgical History  Procedure Laterality Date  . Cholecystectomy    . Bone marrow biopsy    . Other surgical history      Retinal surgery    Current Outpatient Prescriptions  Medication Sig Dispense Refill  . Albuterol Sulfate (PROAIR RESPICLICK IN) Inhale 2 puffs into the lungs as needed. Asthma attacks    . allopurinol (ZYLOPRIM) 300 MG tablet Take 300 mg by mouth daily.    [name] amLODipine (NORVASC) 10 MG tablet Take 10 mg by mouth daily.    . budesonide-formoterol (SYMBICORT) 160-4.5  MCG/ACT inhaler Inhale 2 puffs into the lungs 2 (two) times daily.    . colchicine 0.6 MG tablet Take 0.6 mg by mouth daily.    . insulin aspart protamine- aspart (NOVOLOG MIX 70/30) (70-30) 100 UNIT/ML injection Inject 10 Units into the skin daily. Sliding scale    . insulin glargine (LANTUS) 100 UNIT/ML injection Inject 40 Units into the skin at bedtime.    . metoprolol tartrate (LOPRESSOR) 25 MG tablet Take 25 mg by mouth 2 (two) times daily.    . montelukast (SINGULAIR) 10 MG tablet Take 10 mg by mouth daily.     No current facility-administered medications for this visit.    Allergies:   Codeine   Social  History:  The patient  reports that he has never smoked. He does not have any smokeless tobacco history on file. He reports that he does not drink alcohol or use illicit drugs.   Family History:  The patient's family history includes Diabetes in his mother; Hypertension in his mother.  ROS:  Please see the history of present illness. No BRBPR, melena, hematemesis.  All other systems are reviewed and otherwise negative.   PHYSICAL EXAM:  VS:  BP 162/72 mmHg  Pulse 90  Ht _0  (1.778 m)  Wt 261 lb (118.389 kg)  BMI 37.45 kg/m2  SpO2 94% BMI: Body mass index is 37.45 kg/(m^2). Well nourished, well developed AAM, in no acute distress HEENT: normocephalic, atraumatic Neck: no JVD Cardiac:  normal S1, S2; RRR; no murmur Lungs:  clear to auscultation bilaterally, no wheezing, rhonchi or rales Abd: soft, nontender, no hepatomegaly Ext: no edema Skin: warm and dry Neuro:  moves all extremities spontaneously, no focal abnormalities noted  EKG:  NSR 90bpm, small inferior Q waves, TWI I, V3-V6, may be due to LVH (This was ordered due to CP)  Recent Labs: No results found for requested labs within last 365 days.  No results found for requested labs within last 365 days.   CrCl cannot be calculated (Patient has no serum creatinine result on file.).   Wt Readings from Last 3 Encounters:  09/27/14 261 lb (118.389 kg)     Other studies reviewed: Additional studies/records reviewed today include: 2D echo 10/2012 - Left ventricle: The cavity size was normal. Wall thickness was increased in a pattern of mild LVH. Systolic function was normal. The estimated ejection fraction was in the range of 55% to 60%. Doppler parameters are consistent with abnormal left ventricular relaxation (grade 1 diastolic dysfunction). - Left atrium: Interatrial septum is mobile. The atrium was mildly dilated.  Nuc 10/2012 IMPRESSION: 1. Fixed defect within the anteroseptal wall is most  consistent with infarct. No evidence reversible ischemia. 2. Mild apical dyskinesia. Left ventricular dilatation. 3. Left ventricular ejection fraction equal47%   ASSESSMENT AND PLAN:  1. Dyspnea on exertion with exertional hypoxia (O2 sat down to 83% with ambulation) 2. H/o bilateral PE 07/2011 of unclear cause 3. OSA, uses CPAP 4. Obesity - with recent 25lb unintentional weight loss 5. Sickle cell trait 6. H/o monoclonal gammopathy and anemia - last seen by hematology in 2013 7. Slightly abnormal nuclear stress test in 2014 but no ischemia 8. Essential HTN - BP elevated 9. ? CKD, stage not presently known  Given his exertional hypoxia, worsening DOE, history of unprovoked bilateral PE in 2012, and CKD we have recommended the patient proceed to the ER for further evaluation. A recurrent pulmonary embolism needs to be ruled out given similarity of symptoms to  his prior event in 2012. We do not have any updated labs since 2014 but suspect he may require VQ scan rather than CTA due to renal insufficiency. Last Cr available was 2.1 in 2014. Anemia will also need to be excluded. Also note abnormal CXR at outside facility this morning, raising suspicion of PNA. He also notes cough for a year and 25lb mostly unintentional weight loss. Recommend medicine admission - but we will follow along. From a cardiac standpoint, recommend 2D echocardiogram to start, further workup TBD based on his initial findings. Patient was seen and examined by myself and Dr. Acie Fredrickson, and plan was formulated by collaboration.  Disposition: F/u TBD based on hospital course.  Current medicines are reviewed at length with the patient today.  The patient did not have any concerns regarding medicines. No changes made today - pending ED eval.  Signed, Melina Copa PA-C 09/27/2014 11:51 AM     [facility] [place] [address] [phone] (office)  [phone] (fax)

## 2014-09-27 NOTE — ED Provider Notes (Signed)
  Physical Exam  BP 157/90 mmHg  Pulse 105  Temp(Src) 98.4 F (36.9 C) (Oral)  Resp 20  Ht 5' 10"  (1.778 m)  Wt 261 lb (118.389 kg)  BMI 37.45 kg/m2  SpO2 94% Received the patient in hand often PA Hilda Blades. This is a 49 year old male with a history of spontaneous bilateral pulmonary emboli sent in by his cardiologist for PE rule out and hypoxia. Patient has had 6 months of worsening dyspnea. He has been worked up by a Horticulturist, commercial and diagnosed with asthma.  The patient saw his cardiologist today and was found to be hypoxic and desatted to 83% with ambulation. The patient had a chest x-ray at [facility] Earlier today which showed some atelectasis in the Left lung base.  He has a history of chronic kidney disease and will require a VQ scan today. He is afebrile and hemodynamically stable throughout his initial course in the emergency department.  Physical Exam Physical Exam  Nursing note and vitals reviewed. Constitutional: He appears well-developed and well-nourished. No distress.  HENT:  Head: Normocephalic and atraumatic.  Eyes: Conjunctivae normal are normal. No scleral icterus.  Neck: Normal range of motion. Neck supple.  Cardiovascular: Normal rate, regular rhythm and normal heart sounds.   Pulmonary/Chest: Effort normal and breath sounds normal. No respiratory distress.  Abdominal: Soft. There is no tenderness.  Musculoskeletal: He exhibits no edema.  Neurological: He is alert.  Skin: Skin is warm and dry. He is not diaphoretic.  Psychiatric: His behavior is normal.    ED Course  Procedures  MDM 3:44 PM BP 153/85 mmHg  Pulse 96  Temp(Src) 98.4 F (36.9 C) (Oral)  Resp 23  Ht 5' 10"  (1.778 m)  Wt 261 lb (118.389 kg)  BMI 37.45 kg/m2  SpO2 95% The patient is awaiting a VQ scan. His creatinine is elevated at 4.9. His baseline creatinine is about 2.2.   4:31 PM BP 137/75 mmHg  Pulse 90  Temp(Src) 98.4 F (36.9 C) (Oral)  Resp 22  Ht 5' 10"  (1.778 m)  Wt  261 lb (118.389 kg)  BMI 37.45 kg/m2  SpO2 94%  patient's V/Q scan has returned and shows a mismatch in the right lung apex suggestive of pulmonary embolus. The patient's oxygen saturations are 95% on room air. Ordered heparin and fluids. The patient's hypoxia is secondary to this pulmonary embolus. I doubt any infectious process such as pneumonia. Patient has a high risk PESI score at 8.9%. He will need admission.   Patient admitted to hospital by [facility]. Pt stable in ED with no significant deterioration in condition. I personally reviewed the imaging tests through PACS system. I have reviewed and interpreted Lab values. I reviewed available ER/hospitalization records through the [place], PA-C 09/27/14 [place], MD 09/27/14 2311

## 2014-09-27 NOTE — ED Provider Notes (Signed)
SOB and DOE for a month or so - having cramping in legs now - referred here from cariology for r/o PE - on exam has no tachycardia, no swelling or edema, no JVD, no rales.  He does have hypoxia with ambulation.  ARF on labs, needs admission for PE - see results below.  ED ECG REPORT  I personally interpreted this EKG   Date: 09/27/2014   Rate: 101  Rhythm: sinus tachycardia  QRS Axis: normal  Intervals: normal  ST/T Wave abnormalities: nonspecific T wave changes  Conduction Disutrbances:none  Narrative Interpretation:   Old EKG Reviewed: none available  D/w Radiology - + VQ scan, CXR showed subtle change at L base - infiltrate vs atelectasis at the [facility] imaging earlier today.  Medical screening examination/treatment/procedure(s) were conducted as a shared visit with non-physician practitioner(s) and myself.  I personally evaluated the patient during the encounter.  Clinical Impression:   Final diagnoses:  Hypoxia  Pulmonary embolus, right  Elevated serum creatinine         Johnna Acosta, MD 09/27/14 2332

## 2014-09-27 NOTE — H&P (Signed)
[facility] History and Physical  [facility] ONG:[id number] DOB: [birth year] DOA: 09/27/2014  Referring physician: Margarita Mail, PA-C PCP: Maggie Font, MD   Chief Complaint: Shortness of breath  HPI: Isaiah [name] is a 49 y.o. male with past medical history of diabetes, hypertension and pulmonary embolism in 2012 treated with Coumadin for 6 months. Patient said he developed chronic cough and wheezing in the past 6 months and being treated for asthma, in the past several days he endorses DOE as well as cough. Patient went to his asthma doctor yesterday who referred him to a cardiologist, patient seen his cardiologist earlier today and he was hypoxic with ambulation so they referred him to the emergency department for further evaluation. In the ED initial evaluation with VQ scan showed intermediate probability for PE with segmental mismatch defect in the right apex. Started on IV heparin drip, admitted for further evaluation.  Review of Systems:  Constitutional: negative for anorexia, fevers and sweats Eyes: negative for irritation, redness and visual disturbance Ears, nose, mouth, throat, and face: negative for earaches, epistaxis, nasal congestion and sore throat Respiratory: negative for cough, dyspnea on exertion, sputum and wheezing Cardiovascular: negative for chest pain, dyspnea, lower extremity edema, orthopnea, palpitations and syncope Gastrointestinal: negative for abdominal pain, constipation, diarrhea, melena, nausea and vomiting Genitourinary:negative for dysuria, frequency and hematuria Hematologic/lymphatic: negative for bleeding, easy bruising and lymphadenopathy Musculoskeletal:negative for arthralgias, muscle weakness and stiff joints Neurological: negative for coordination problems, gait problems, headaches and weakness Endocrine: negative for diabetic symptoms including polydipsia, polyuria and weight loss Allergic/Immunologic: negative for anaphylaxis, hay fever  and urticaria  Past Medical History  Diagnosis Date  . Diabetes mellitus   . Hypertension   . Sickle cell-thalassemia disease   . Gout   . Pulmonary embolism 07/2011    on Coumadin  . Biopsychosocial stress experienced by infants at birth 2012    negative  bone marrow  biopsy.    Past Surgical History  Procedure Laterality Date  . Cholecystectomy    . Pars plana vitrectomy  02/17/2012    Procedure: PARS PLANA VITRECTOMY WITH 25 GAUGE;  Surgeon: Hayden Pedro, MD;  Location: [facility];  Service: Ophthalmology;  Laterality: Right;   Social History:   reports that he has never smoked. He does not have any smokeless tobacco history on file. He reports that he does not drink alcohol or use illicit drugs.  Allergies  Allergen Reactions  . Codeine Rash and Other (See Comments)    Unknown reaction (patient says it was more serious than just a rash, but he can't remember what happened)    Family History  Problem Relation Age of Onset  . Sickle cell anemia Brother   . Diabetes type I Brother   . Kidney disease Brother      Prior to Admission medications   Medication Sig Start Date End Date Taking? Authorizing Provider  amLODipine (NORVASC) 10 MG tablet Take 10 mg by mouth daily.    Historical Provider, MD  aspirin 81 MG chewable tablet Chew 81 mg by mouth daily.    Historical Provider, MD  atorvastatin (LIPITOR) 10 MG tablet Take 10 mg by mouth daily.    Historical Provider, MD  cetirizine (ZYRTEC) 10 MG tablet Take 10 mg by mouth daily as needed.     Historical Provider, MD  colchicine 0.6 MG tablet Take 0.6 mg by mouth daily as needed.    Historical Provider, MD  furosemide (LASIX) 20 MG tablet Take 20 mg by mouth  daily as needed.  10/04/12   Jasper Riling. Pickering, MD  insulin glargine (LANTUS) 100 UNIT/ML injection Inject 40 Units into the skin at bedtime.     Historical Provider, MD  insulin lispro (HUMALOG) 100 UNIT/ML injection Inject 10 Units into the skin 2 (two) times daily  before a meal. Sliding scale. Usually does between 10-12 units.    Historical Provider, MD   Physical Exam: Filed Vitals:   09/27/14 1700  BP: 134/79  Pulse: 86  Temp:   Resp: 20   Constitutional: Oriented to person, place, and time. Well-developed and well-nourished. Cooperative.  Head: Normocephalic and atraumatic.  Nose: Nose normal.  Mouth/Throat: Uvula is midline, oropharynx is clear and moist and mucous membranes are normal.  Eyes: Conjunctivae and EOM are normal. Pupils are equal, round, and reactive to light.  Neck: Trachea normal and normal range of motion. Neck supple.  Cardiovascular: Normal rate, regular rhythm, S1 normal, S2 normal, normal heart sounds and intact distal pulses.   Pulmonary/Chest: Effort normal and breath sounds normal.  Abdominal: Soft. Bowel sounds are normal. There is no hepatosplenomegaly. There is no tenderness.  Musculoskeletal: Normal range of motion.  Neurological: Alert and oriented to person, place, and time. Has normal strength. No cranial nerve deficit or sensory deficit.  Skin: Skin is warm, dry and intact.  Psychiatric: Has a normal mood and affect. Speech is normal and behavior is normal.   Labs on Admission:  Basic Metabolic Panel:  Recent Labs Lab 09/27/14 1444  NA 139  K 5.0  CL 115*  CO2 16*  GLUCOSE 186*  BUN 59*  CREATININE 4.90*  CALCIUM 8.2*   Liver Function Tests: No results for input(s): AST, ALT, ALKPHOS, BILITOT, PROT, ALBUMIN in the last 168 hours. No results for input(s): LIPASE, AMYLASE in the last 168 hours. No results for input(s): AMMONIA in the last 168 hours. CBC:  Recent Labs Lab 09/27/14 1444  WBC 17.8*  HGB 8.2*  HCT 22.7*  MCV 74.4*  PLT 329   Cardiac Enzymes: No results for input(s): CKTOTAL, CKMB, CKMBINDEX, TROPONINI in the last 168 hours.  BNP (last 3 results) No results for input(s): PROBNP in the last 8760 hours. CBG: No results for input(s): GLUCAP in the last 168  hours.  Radiological Exams on Admission: Nm Pulmonary Perf And Vent  09/27/2014   CLINICAL DATA:  49 year old male with shortness of breath. Initial encounter.  EXAM: NUCLEAR MEDICINE VENTILATION - PERFUSION LUNG SCAN  TECHNIQUE: Ventilation images were obtained in multiple projections using inhaled aerosol technetium 99 M DTPA. Perfusion images were obtained in multiple projections after intravenous injection of Tc-2mMAA.  RADIOPHARMACEUTICALS:  40.0 mCi Tc-968mTPA aerosol and 6.0 mCi Tc-9948mA  COMPARISON:  V/Q scan 10/04/2012, and earlier. Chest radiographs 10/04/2012.  FINDINGS: Ventilation: No focal ventilation defect.  Perfusion: Wedge-shaped peripheral segmental defect in the right lung apex. No other perfusion defect.  IMPRESSION: Solitary mismatched segmental defect in the right apex.  Recommend followup chest radiographs with attention to the right apex, but at this point the study meets PIOPED II criteria for Intermediate Probability Of Acute Pulmonary Embolus   Electronically Signed   By: LeeLars PinksD.   On: 09/27/2014 16:11    EKG: Independently reviewed.   Assessment/Plan Principal Problem:   Pulmonary embolus Active Problems:   SOB (shortness of breath)   DM (diabetes mellitus) type II controlled with renal manifestation   HTN (hypertension)   CKD (chronic kidney disease), stage IV   MGUS (  monoclonal gammopathy of unknown significance)    Pulmonary embolism History of previous PE, treated for 6 months with Coumadin (now off of Coumadin). Had recent long drive about Christmas time to [place], [place], and then to [place], [place]. Patient started already on heparin coagulopathy workup will be postponed. Check lower extremity Doppler and 2-D echocardiogram. Treat with heparin, start Coumadin in a.m. suspect he will need longer periods of anticoagulation, could be lifelong. He can follow with hematology as patient as he follow-up with them already.  CKD stage IV Last  creatinine was 2.2 in January 2014, patient presented with creatinine of 4.9. Has history of DM 2, HTN and MGUS. I will hold Lasix, hydrate patient gently with IV fluids, check BMP in a.m.  MGUS Cannot rule out monoclonal gammopathy of renal significance, causing CKD along with him to an HTN. Recheck SPEP, rule out transformation into multiple myeloma, which can cause renal disease and coagulopathy.  If there is no evidence of myeloma patient can follow with Dr. Alen Blew as outpatient.  Diabetes mellitus type 2 According to the patient it is controlled with his current regimen. Continue current Lantus regimen of 40 units, SSI and carb modified diet, check A1c.  HTN Reasonable controlled, continue home medications.  Code Status: Full code Family Communication: Plan discussed with the patient in the presence of his wife at bedside. Disposition Plan: Telemetry  Time spent: 70 minutes  [facility] Pager [phone]

## 2014-09-27 NOTE — Progress Notes (Addendum)
Patient seen in cardiology office today by myself and Dr. Acie Fredrickson. Sent to ER for hypoxia and DOE. He desatted in allergist's office yesterday and desatted today to 83% on ambulation in our office. Significant h/o bilateral PE 2012, CKD (last Cr in 2014 was 2.1), monoclonal gammopathy, anemia, sickle cell trait, obesity. Recent unintentional weight loss. Outpatient CXR today ordered by allergist also raised question of PNA. We plan to follow along as consultants during hospital stay and will see him again in AM. Recommend 2D echo to be ordered once admitted.  The patient was registered for his appointment today in our office with a new MRN - [id number]. For convenience I have copied our office note from today below:         Cardiology Office Note  Date: 09/27/2014   Patient ID: Isaiah [name], Isaiah [name] 01-Dec-1965, MRN [id number]  PCP: Maggie Font, MD Cardiologist: Previously saw Dr. [name] - here to establish care with Dr. Acie Fredrickson NOTE: The patient has 2 MRNs (other one is [id number])  Chief Complaint: shortness of breath with exertion, low O2 sat  History of Present Illness:  Isaiah [name] is a 49 y.o. male with history of sickle cell trait, bilateral PE 07/2011, HTN, DM, OSA (uses CPAP), obesity (recently lost 25lb unintentionally), monoclonal gammopathy/anemia (saw heme in 2013), CKD (Cr 2.1 in 2014, no recent labs available) who is here for eval of DOE and decreased O2 sats. Per review of other MRN's chart, he had elevated troponins at time of his PEs in 2012 felt due to right heart strain. Cause of PE unknown. He says he was treated with 6 months of Coumadin. He was seen by Dr. [name] in 2014 (he states due to a murmur) at which time 2D echo 10/2012 showed mild LVH, EF 55-60%, grade 1 DD, interatrial septum is mobile, mildly dilated LA. Nuc 10/2012: fixed defect in anteroseptal wall most c/w infarct, no reversible ischemia, mild apical dyskinesia, LV dilitation, EF 47%. He denies prior  cardiac cath.  He has recently been followed by his allergist for cough x 1 year. He was diagnosed with asthma and started on asthma medication. He followed up there yesterday and was noting exertional dyspnea. Apparently in their office he was hypoxic with ambulation into the 80's. CXR: subtle airspace disease at left base, suspcious for infection, atypical PNA would be a consideration. It was recommended that he follow up with a cardiologist, hence his appointment today. He notes for the past 6 months or more he's felt increasing DOE. He now gets SOB and has to rest after stairs. No SOB at rest. He denies any chest pain. No diaphoresis, nausea, vomiting, palpitations, near-syncope or syncope. He does endorse chronic low grade LEE which is not different lately.  He ambulated in clinic today with O2 sat dropping to 83% on RA. He is 94% at rest. He denies acute dyspnea or CP but still continues to note DOE with higher levels of exertion. He does note a 25lb weight loss over 6 months with possibly less appetite but otherwise generally unintentional loss. No reported bleeding noted.  Past Medical History  Diagnosis Date  . Asthma   . Diabetes mellitus without complication   . Hypertension   . Gout   . Sickle cell-thalassemia disease     a. Sickle cell trait.  . Pulmonary embolism 07/2011    a. Tx with Coumadin for 6 months (unknown cause per patient).  . Hyperkalemia 07/2011  . Acute renal failure 07/2011  .  Anemia   . Monoclonal gammopathy   . CKD (chronic kidney disease)   . Sleep apnea     a. uses CPAP.    Past Surgical History  Procedure Laterality Date  . Cholecystectomy    . Bone marrow biopsy    . Other surgical history      Retinal surgery    Current Outpatient Prescriptions  Medication Sig Dispense Refill  . Albuterol Sulfate (PROAIR RESPICLICK IN) Inhale 2 puffs into the lungs as needed. Asthma  attacks    . allopurinol (ZYLOPRIM) 300 MG tablet Take 300 mg by mouth daily.    [name] amLODipine (NORVASC) 10 MG tablet Take 10 mg by mouth daily.    . budesonide-formoterol (SYMBICORT) 160-4.5 MCG/ACT inhaler Inhale 2 puffs into the lungs 2 (two) times daily.    . colchicine 0.6 MG tablet Take 0.6 mg by mouth daily.    . insulin aspart protamine- aspart (NOVOLOG MIX 70/30) (70-30) 100 UNIT/ML injection Inject 10 Units into the skin daily. Sliding scale    . insulin glargine (LANTUS) 100 UNIT/ML injection Inject 40 Units into the skin at bedtime.    . metoprolol tartrate (LOPRESSOR) 25 MG tablet Take 25 mg by mouth 2 (two) times daily.    . montelukast (SINGULAIR) 10 MG tablet Take 10 mg by mouth daily.     No current facility-administered medications for this visit.    Allergies: Codeine   Social History: The patient  reports that he has never smoked. He does not have any smokeless tobacco history on file. He reports that he does not drink alcohol or use illicit drugs.   Family History: The patient's family history includes Diabetes in his mother; Hypertension in his mother.  ROS: Please see the history of present illness. No BRBPR, melena, hematemesis.  All other systems are reviewed and otherwise negative.   PHYSICAL EXAM:  VS: BP 162/72 mmHg  Pulse 90  Ht _0  (1.778 m)  Wt 261 lb (118.389 kg)  BMI 37.45 kg/m2  SpO2 94% BMI: Body mass index is 37.45 kg/(m^2). Well nourished, well developed AAM, in no acute distress  HEENT: normocephalic, atraumatic  Neck: no JVD  Cardiac: normal S1, S2; RRR; no murmur  Lungs: clear to auscultation bilaterally, no wheezing, rhonchi or rales  Abd: soft, nontender, no hepatomegaly  Ext: no edema  Skin: warm and dry  Neuro: moves all extremities spontaneously, no focal abnormalities noted  EKG: NSR 90bpm, small inferior Q waves, TWI I, V3-V6, may be due to LVH (This was ordered due to  CP)  Recent Labs: No results found for requested labs within last 365 days.  No results found for requested labs within last 365 days.   CrCl cannot be calculated (Patient has no serum creatinine result on file.).   Wt Readings from Last 3 Encounters:  09/27/14 261 lb (118.389 kg)     Other studies reviewed: Additional studies/records reviewed today include: 2D echo 10/2012 - Left ventricle: The cavity size was normal. Wall thickness was increased in a pattern of mild LVH. Systolic function was normal. The estimated ejection fraction was in the range of 55% to 60%. Doppler parameters are consistent with abnormal left ventricular relaxation (grade 1 diastolic dysfunction). - Left atrium: Interatrial septum is mobile. The atrium was mildly dilated.  Nuc 10/2012 IMPRESSION: 1. Fixed defect within the anteroseptal wall is most consistent with infarct. No evidence reversible ischemia. 2. Mild apical dyskinesia. Left ventricular dilatation. 3. Left ventricular ejection fraction equal47%  ASSESSMENT AND PLAN:  1. Dyspnea on exertion with exertional hypoxia (O2 sat down to 83% with ambulation) 2. H/o bilateral PE 07/2011 of unclear cause 3. OSA, uses CPAP 4. Obesity - with recent 25lb unintentional weight loss 5. Sickle cell trait 6. H/o monoclonal gammopathy and anemia - last seen by hematology in 2013 7. Slightly abnormal nuclear stress test in 2014 but no ischemia 8. Essential HTN - BP elevated 9. ? CKD, stage not presently known  Given his exertional hypoxia, worsening DOE, history of unprovoked bilateral PE in 2012, and CKD we have recommended the patient proceed to the ER for further evaluation. A recurrent pulmonary embolism needs to be ruled out given similarity of symptoms to his prior event in 2012. We do not have any updated labs since 2014 but suspect he may require VQ scan rather than CTA due to renal insufficiency. Last Cr available was 2.1 in  2014. Anemia will also need to be excluded. Also note abnormal CXR at outside facility this morning, raising suspicion of PNA. He also notes cough for a year and 25lb mostly unintentional weight loss. Recommend medicine admission - but we will follow along. From a cardiac standpoint, recommend 2D echocardiogram to start, further workup TBD based on his initial findings. Patient was seen and examined by myself and Dr. Acie Fredrickson, and plan was formulated by collaboration.  Disposition: F/u TBD based on hospital course.  Current medicines are reviewed at length with the patient today. The patient did not have any concerns regarding medicines. No changes made today - pending ED eval.  Signed, Melina Copa PA-C 09/27/2014 11:51 AM   [facility] [place] [id number] [phone] (office)  [phone] (fax)

## 2014-09-28 DIAGNOSIS — D472 Monoclonal gammopathy: Secondary | ICD-10-CM

## 2014-09-28 DIAGNOSIS — I2699 Other pulmonary embolism without acute cor pulmonale: Secondary | ICD-10-CM

## 2014-09-28 DIAGNOSIS — G4733 Obstructive sleep apnea (adult) (pediatric): Secondary | ICD-10-CM

## 2014-09-28 LAB — HEPARIN LEVEL (UNFRACTIONATED)
Heparin Unfractionated: 0.74 IU/mL — ABNORMAL HIGH (ref 0.30–0.70)
Heparin Unfractionated: 0.83 IU/mL — ABNORMAL HIGH (ref 0.30–0.70)
Heparin Unfractionated: 0.53 IU/mL (ref 0.30–0.70)

## 2014-09-28 LAB — BASIC METABOLIC PANEL
Anion gap: 7 (ref 5–15)
BUN: 55 mg/dL — ABNORMAL HIGH (ref 6–23)
CO2: 15 mmol/L — ABNORMAL LOW (ref 19–32)
Calcium: 7.8 mg/dL — ABNORMAL LOW (ref 8.4–10.5)
Chloride: 117 mEq/L — ABNORMAL HIGH (ref 96–112)
Creatinine, Ser: 4.55 mg/dL — ABNORMAL HIGH (ref 0.50–1.35)
GFR calc Af Amer: 16 mL/min — ABNORMAL LOW (ref >90)
GFR calc non Af Amer: 14 mL/min — ABNORMAL LOW (ref >90)
Glucose, Bld: 97 mg/dL (ref 70–99)
Potassium: 5.1 mmol/L (ref 3.5–5.1)
Sodium: 139 mmol/L (ref 135–145)

## 2014-09-28 LAB — GLUCOSE, CAPILLARY
Glucose-Capillary: 93 mg/dL (ref 70–99)
Glucose-Capillary: 129 mg/dL — ABNORMAL HIGH (ref 70–99)
Glucose-Capillary: 97 mg/dL (ref 70–99)
Glucose-Capillary: 163 mg/dL — ABNORMAL HIGH (ref 70–99)

## 2014-09-28 LAB — CBC
HCT: 22 % — ABNORMAL LOW (ref 39.0–52.0)
Hemoglobin: 7.7 g/dL — ABNORMAL LOW (ref 13.0–17.0)
MCH: 26.8 pg (ref 26.0–34.0)
MCHC: 35 g/dL (ref 30.0–36.0)
MCV: 76.7 fL — ABNORMAL LOW (ref 78.0–100.0)
Platelets: 291 10*3/uL (ref 150–400)
RBC: 2.87 MIL/uL — ABNORMAL LOW (ref 4.22–5.81)
RDW: 18.9 % — ABNORMAL HIGH (ref 11.5–15.5)
WBC: 16.8 10*3/uL — ABNORMAL HIGH (ref 4.0–10.5)

## 2014-09-28 LAB — HEMOGLOBIN A1C
Hgb A1c MFr Bld: 5 % (ref <5.7)
Mean Plasma Glucose: 97 mg/dL (ref <117)

## 2014-09-28 LAB — TSH: TSH: 2.894 u[IU]/mL (ref 0.350–4.500)

## 2014-09-28 LAB — MAGNESIUM: Magnesium: 1.5 mg/dL (ref 1.5–2.5)

## 2014-09-28 MED ORDER — WARFARIN - PHARMACIST DOSING INPATIENT
Freq: Every day | Status: DC
Start: 2014-09-28 — End: 2014-10-01
  Administered 2014-09-28: 17:00:00

## 2014-09-28 MED ORDER — PATIENT'S GUIDE TO USING COUMADIN BOOK
Freq: Once | Status: AC
  Administered 2014-09-28: 17:00:00
  Filled 2014-09-28: qty 1

## 2014-09-28 MED ORDER — MAGNESIUM SULFATE IN D5W 10-5 MG/ML-% IV SOLN
1.0000 g | Freq: Once | INTRAVENOUS | Status: AC
  Administered 2014-09-28: 1 g via INTRAVENOUS
  Filled 2014-09-28: qty 100

## 2014-09-28 MED ORDER — WARFARIN SODIUM 10 MG PO TABS
10.0000 mg | ORAL_TABLET | Freq: Once | ORAL | Status: AC
  Administered 2014-09-28: 10 mg via ORAL
  Filled 2014-09-28: qty 1

## 2014-09-28 MED ORDER — WARFARIN VIDEO
Freq: Once | Status: AC
  Administered 2014-09-28: 17:00:00

## 2014-09-28 NOTE — Progress Notes (Signed)
Utilization Review Completed.Donne Anon T1/21/2016

## 2014-09-28 NOTE — Progress Notes (Signed)
CPAP setup at bedside. FFM. 10cm H20 per home settings. No O2 bled in at this time. Water added to humidity chamber. Pt resting comfortably at this time.

## 2014-09-28 NOTE — Progress Notes (Signed)
ANTICOAGULATION CONSULT NOTE   Pharmacy Consult for Heparin Indication: Pulmonary Embolism  Allergies  Allergen Reactions  . Codeine Rash and Other (See Comments)    Unknown reaction (patient says it was more serious than just a rash, but he can't remember what happened)    Patient Measurements: Height: 5' 10"  (177.8 cm) Weight: 258 lb 6.4 oz (117.209 kg) IBW/kg (Calculated) : 73 Heparin Dosing Weight: 99.4 kg  Vital Signs: Temp: 97.9 F (36.6 C) (01/21 0412) Temp Source: Oral (01/21 0412) BP: 157/86 mmHg (01/21 1105) Pulse Rate: 90 (01/21 1105)  Labs:  Recent Labs  09/27/14 1444 09/27/14 1926 09/28/14 0253 09/28/14 0453 09/28/14 1158  HGB 8.2*  --   --  7.7*  --   HCT 22.7*  --   --  22.0*  --   PLT 329  --   --  291  --   LABPROT 15.0 14.7  --   --   --   INR 1.17 1.13  --   --   --   HEPARINUNFRC  --   --  0.74*  --  0.83*  CREATININE 4.90*  --   --  4.55*  --     Estimated Creatinine Clearance: 24.9 mL/min (by C-G formula based on Cr of 4.55).   Medical History: Past Medical History  Diagnosis Date  . Hypertension   . Sickle cell-thalassemia disease   . Gout   . Pulmonary embolism 07/2011; 09/27/2014    a. Bilat PE 07/2011 - unclear cause, tx with 6 months Coumadin.;   . Monoclonal gammopathies   . Anemia   . Type II diabetes mellitus   . Asthma   . OSA on CPAP   . Sickle-cell thalassemia   . CKD (chronic kidney disease), stage IV    Infusions:  . sodium chloride 75 mL/hr at 09/28/14 1233  . heparin 1,600 Units/hr (09/28/14 0400)   Assessment:  14 YOM w/ PMH of unprovoked bilateral PE '12, CKD, monoclonal gammopathy, anemia, sickle cell trait, and obesity presenting to [facility] on 09/27/14 c/o SOB on exertion.  Patient was seen by cardiology office on 09/27/14 and desatted to 83%, and recommended to be further evaluated in the ED.   VQ scan shows intermediate probability of PE, dopplers positive for DVT in left peroneal veins. Heparin started last  night, warfarin starting tonight. Day #1/5 VTE overlap.  Heparin level elevated this am at 0.83, will decrease rate. Baseline INR 1.1  Hgb 7.7, pltc 291. No bleeding issues noted. Scr 4.55, CrCl ~20-25 mL/min  Patient was on warfarin for previous PE in 2012.  Goal of Therapy:  INR goal 2-3 Heparin level 0.3-0.7 units/ml Monitor platelets by anticoagulation protocol: Yes   Plan:  - Decrease Heparin to 1400 units/hr - Heparin level 8 hours - Daily CBC and HL - Monitor for signs and symptoms of bleeding - Warfarin 19m tonight  FErin HearingPharmD., BCPS Clinical Pharmacist Pager [phone]/21/2016 1:10 PM

## 2014-09-28 NOTE — Progress Notes (Signed)
ANTICOAGULATION CONSULT NOTE   Pharmacy Consult for Heparin Indication: Pulmonary Embolism  Allergies  Allergen Reactions  . Codeine Rash and Other (See Comments)    Unknown reaction (patient says it was more serious than just a rash, but he can't remember what happened)    Patient Measurements: Height: 5' 10"  (177.8 cm) Weight: 258 lb 6.4 oz (117.209 kg) IBW/kg (Calculated) : 73 Heparin Dosing Weight: 99.4 kg  Vital Signs: Temp: 97.5 F (36.4 C) (01/21 1350) Temp Source: Oral (01/21 1350) BP: 147/74 mmHg (01/21 1350) Pulse Rate: 93 (01/21 1350)  Labs:  Recent Labs  09/27/14 1444 09/27/14 1926 09/28/14 0253 09/28/14 0453 09/28/14 1158 09/28/14 2045  HGB 8.2*  --   --  7.7*  --   --   HCT 22.7*  --   --  22.0*  --   --   PLT 329  --   --  291  --   --   LABPROT 15.0 14.7  --   --   --   --   INR 1.17 1.13  --   --   --   --   HEPARINUNFRC  --   --  0.74*  --  0.83* 0.53  CREATININE 4.90*  --   --  4.55*  --   --     Estimated Creatinine Clearance: 24.9 mL/min (by C-G formula based on Cr of 4.55).   Medical History: Past Medical History  Diagnosis Date  . Hypertension   . Sickle cell-thalassemia disease   . Gout   . Pulmonary embolism 07/2011; 09/27/2014    a. Bilat PE 07/2011 - unclear cause, tx with 6 months Coumadin.;   . Monoclonal gammopathies   . Anemia   . Type II diabetes mellitus   . Asthma   . OSA on CPAP   . Sickle-cell thalassemia   . CKD (chronic kidney disease), stage IV    Infusions:  . sodium chloride 75 mL/hr at 09/28/14 1233  . heparin 1,400 Units/hr (09/28/14 1716)   Assessment:  89 YOM w/ PMH of unprovoked bilateral PE '12, CKD, monoclonal gammopathy, anemia, sickle cell trait, and obesity presenting to [facility] on 09/27/14 c/o SOB on exertion.  VQ scan shows intermediate probability of PE, dopplers positive for DVT in left peroneal veins.   Heparin is now at goal (HL= 0.53) on 1400 units/hr noted on day 1 overlap of  heparin/coumadin.  Goal of Therapy:  INR goal 2-3 Heparin level 0.3-0.7 units/ml Monitor platelets by anticoagulation protocol: Yes   Plan:   -No heparin changes needed -Daily heparin level and CBC  Hildred Laser, Pharm D 09/28/2014 9:22 PM

## 2014-09-28 NOTE — Progress Notes (Signed)
Echocardiogram 2D Echocardiogram has been performed.  Isaiah [name] 09/28/2014, 10:22 AM

## 2014-09-28 NOTE — Progress Notes (Signed)
[facility] PROGRESS NOTE  [facility] NIO:[id number] DOB: 06/07/1966 DOA: 09/27/2014 PCP: Maggie Font, MD  Assessment/Plan: 1. Pulmonary embolism: Admitted to telemetry. Started on IV heparin and coumadin.  Echocardiogram ordered and lower extremity dopplers show DVT in the left peroneal veins.    Stage 4 CKD: Holding lasix. Probably from diabetes and MGUS.  Recommend outpatient follow up with nephrology.    MGUS: SPEP ordered. Will call hematology consult for further recommendations.    Type 2 DM: SSI. Resume home lantus.      Code Status: full code.  Family Communication: none at bedside.  Disposition Plan: pending.    Consultants:  Cardiology  Hematology.   Procedures: V/ q scan.  Antibiotics:  none  HPI/Subjective: Sob improved. No nausea or vomiting.  Objective: Filed Vitals:   09/28/14 1350  BP: 147/74  Pulse: 93  Temp: 97.5 F (36.4 C)  Resp: 18    Intake/Output Summary (Last 24 hours) at 09/28/14 1912 Last data filed at 09/28/14 1700  Gross per 24 hour  Intake    720 ml  Output   1650 ml  Net   -930 ml   Filed Weights   09/27/14 1352 09/27/14 1813 09/28/14 0500  Weight: 118.389 kg (261 lb) 116.257 kg (256 lb 4.8 oz) 117.209 kg (258 lb 6.4 oz)    Exam:   General:  Alert afebrile comfortable  Cardiovascular: s1s2  Respiratory: ctab, no wheezing or rhonchi  Abdomen: soft non tender non distended bowel sounds heard  Musculoskeletal: nop edal edema.   Data Reviewed: Basic Metabolic Panel:  Recent Labs Lab 09/27/14 1444 09/28/14 0453 09/28/14 1158  NA 139 139  --   K 5.0 5.1  --   CL 115* 117*  --   CO2 16* 15*  --   GLUCOSE 186* 97  --   BUN 59* 55*  --   CREATININE 4.90* 4.55*  --   CALCIUM 8.2* 7.8*  --   MG  --   --  1.5   Liver Function Tests: No results for input(s): AST, ALT, ALKPHOS, BILITOT, PROT, ALBUMIN in the last 168 hours. No results for input(s): LIPASE, AMYLASE in the last 168 hours. No  results for input(s): AMMONIA in the last 168 hours. CBC:  Recent Labs Lab 09/27/14 1444 09/28/14 0453  WBC 17.8* 16.8*  HGB 8.2* 7.7*  HCT 22.7* 22.0*  MCV 74.4* 76.7*  PLT 329 291   Cardiac Enzymes: No results for input(s): CKTOTAL, CKMB, CKMBINDEX, TROPONINI in the last 168 hours. BNP (last 3 results) No results for input(s): PROBNP in the last 8760 hours. CBG:  Recent Labs Lab 09/27/14 2226 09/28/14 0549 09/28/14 1125 09/28/14 1626  GLUCAP 156* 93 129* 97    No results found for this or any previous visit (from the past 240 hour(s)).   Studies: Nm Pulmonary Perf And Vent  09/27/2014   CLINICAL DATA:  49 year old male with shortness of breath. Initial encounter.  EXAM: NUCLEAR MEDICINE VENTILATION - PERFUSION LUNG SCAN  TECHNIQUE: Ventilation images were obtained in multiple projections using inhaled aerosol technetium 99 M DTPA. Perfusion images were obtained in multiple projections after intravenous injection of Tc-54mMAA.  RADIOPHARMACEUTICALS:  40.0 mCi Tc-941mTPA aerosol and 6.0 mCi Tc-999mA  COMPARISON:  V/Q scan 10/04/2012, and earlier. Chest radiographs 10/04/2012.  FINDINGS: Ventilation: No focal ventilation defect.  Perfusion: Wedge-shaped peripheral segmental defect in the right lung apex. No other perfusion defect.  IMPRESSION: Solitary mismatched segmental defect in the right apex.  Recommend followup chest radiographs with attention to the right apex, but at this point the study meets PIOPED II criteria for Intermediate Probability Of Acute Pulmonary Embolus   Electronically Signed   By: Lars Pinks M.D.   On: 09/27/2014 16:11    Scheduled Meds: . amLODipine  10 mg Oral Daily  . insulin aspart  0-9 Units Subcutaneous TID WC  . insulin glargine  40 Units Subcutaneous QHS  . magnesium sulfate 1 - 4 g bolus IVPB  1 g Intravenous Once  . sodium chloride  3 mL Intravenous Q12H  . Warfarin - Pharmacist Dosing Inpatient   Does not apply q1800   Continuous  Infusions: . sodium chloride 75 mL/hr at 09/28/14 1233  . heparin 1,400 Units/hr (09/28/14 1716)    Principal Problem:   Pulmonary embolus Active Problems:   SOB (shortness of breath)   DM (diabetes mellitus) type II controlled with renal manifestation   HTN (hypertension)   CKD (chronic kidney disease), stage IV   MGUS (monoclonal gammopathy of unknown significance)   OSA on CPAP    Time spent: 25 min    [facility] Pager [phone] If 7PM-7AM, please contact night-coverage at [email or web address], password [email or web address] 09/28/2014, 7:12 PM  LOS: 1 day

## 2014-09-28 NOTE — Progress Notes (Signed)
*  Preliminary Results* Bilateral lower extremity venous duplex completed. Right lower extremity is negative for deep vein thrombosis. The left lower extremity is positive for deep vein thrombosis involving the left peroneal veins. There is no evidence of Baker's cyst bilaterally.  Preliminary results discussed with Maudie Mercury, RN.  09/28/2014  Maudry Mayhew, RVT, RDCS, RDMS

## 2014-09-28 NOTE — Progress Notes (Signed)
Tel shows 6 bts VT, pt asymptomatic, Dr. Karleen Hampshire made aware Isaiah [name]

## 2014-09-28 NOTE — Consult Note (Signed)
See progress note just before this for cardiology consult. (Seen in office - was erroneously registered under different MRN.) Melina Copa PA-C

## 2014-09-28 NOTE — Progress Notes (Signed)
Patient: [facility] / Admit Date: 09/27/2014 / Date of Encounter: 09/28/2014, 7:55 AM   Subjective: 49 y/o M with h/o unprovoked bilateral PE 2012, CKD (Cr in 2014 was 2.1), monoclonal gammopathy/anemia (last eval 2013), sickle cell trait, obesity, OSA presented as a new patient to [facility] yesterday for eval of DOE and hypoxia upon ambulation, also noting 25lb unintentional wt loss. Outpt CXR - left base airspace disease suspicious for infection. Sent to ER - found to have intermediate prob VQ, AKI on CKD, and acute on chronic anemia.   Denies dyspnea or CP this AM. No complaints.  Objective: Telemetry:NSR/ST/occasional vent trigeminy Physical Exam: Blood pressure 136/75, pulse 85, temperature 97.9 F (36.6 C), temperature source Oral, resp. rate 18, height 5' 10"  (1.778 m), weight 258 lb 6.4 oz (117.209 kg), SpO2 91 %. General: Well developed, well nourished AAM in no acute distress. Head: Normocephalic, atraumatic, sclera non-icteric, no xanthomas, nares are without discharge. Neck: JVP not elevated. Lungs: Clear bilaterally to auscultation without wheezes, rales, or rhonchi. Breathing is unlabored. Heart: RRR (borderline elevated rate) S1 S2 without murmurs, rubs, or gallops.  Abdomen: Soft, non-tender, non-distended with normoactive bowel sounds. No rebound/guarding. Extremities: No clubbing or cyanosis. No edema. Distal pedal pulses are 2+ and equal bilaterally. Neuro: Alert and oriented X 3. Moves all extremities spontaneously. Psych:  Responds to questions appropriately with a normal affect.  No intake or output data in the 24 hours ending 09/28/14 0755  Inpatient Medications:  . amLODipine  10 mg Oral Daily  . insulin aspart  0-9 Units Subcutaneous TID WC  . insulin glargine  40 Units Subcutaneous QHS  . sodium chloride  3 mL Intravenous Q12H  . Warfarin - Pharmacist Dosing Inpatient   Does not apply q1800   Infusions:  . sodium chloride 75 mL/hr at 09/27/14 1808  . heparin  1,600 Units/hr (09/28/14 0400)    Labs:  Recent Labs  09/27/14 1444 09/28/14 0453  NA 139 139  K 5.0 5.1  CL 115* 117*  CO2 16* 15*  GLUCOSE 186* 97  BUN 59* 55*  CREATININE 4.90* 4.55*  CALCIUM 8.2* 7.8*   No results for input(s): AST, ALT, ALKPHOS, BILITOT, PROT, ALBUMIN in the last 72 hours.  Recent Labs  09/27/14 1444 09/28/14 0453  WBC 17.8* 16.8*  HGB 8.2* 7.7*  HCT 22.7* 22.0*  MCV 74.4* 76.7*  PLT 329 291   No results for input(s): CKTOTAL, CKMB, TROPONINI in the last 72 hours. Invalid input(s): POCBNP  Recent Labs  09/27/14 1926  HGBA1C 5.0     Radiology/Studies:  Nm Pulmonary Perf And Vent  09/27/2014   CLINICAL DATA:  49 year old male with shortness of breath. Initial encounter.  EXAM: NUCLEAR MEDICINE VENTILATION - PERFUSION LUNG SCAN  TECHNIQUE: Ventilation images were obtained in multiple projections using inhaled aerosol technetium 99 M DTPA. Perfusion images were obtained in multiple projections after intravenous injection of Tc-7mMAA.  RADIOPHARMACEUTICALS:  40.0 mCi Tc-923mTPA aerosol and 6.0 mCi Tc-9964mA  COMPARISON:  V/Q scan 10/04/2012, and earlier. Chest radiographs 10/04/2012.  FINDINGS: Ventilation: No focal ventilation defect.  Perfusion: Wedge-shaped peripheral segmental defect in the right lung apex. No other perfusion defect.  IMPRESSION: Solitary mismatched segmental defect in the right apex.  Recommend followup chest radiographs with attention to the right apex, but at this point the study meets PIOPED II criteria for Intermediate Probability Of Acute Pulmonary Embolus   Electronically Signed   By: LeeLars PinksD.   On: 09/27/2014 16:11  Assessment and Plan  1. Dyspnea on exertion and hypoxia, suspect due to PE and anemia 2. Recurrent PE 3. AKI on CKD 4. MGUS/acute on chronic anemia 5. HTN 6. Ventricular trigeminy  7. H/o somewhat abnormal nuc in 2014 - (fixed defect in anteroseptal wall most c/w infarct, no reversible ischemia,  mild apical dyskinesia, LV dilitation, EF 47% - seen by [facility] as outpatient)  Symptoms likely attributable to PE and acute on chronic anemia. Troponin negative and BNP normal. F/u echocardiogram. Do not anticipate any invasive cardiac procedures during this admission, but we are happy to f/u afterwards in the office. Will add Mg level for PVCs on tele. Would really consider heme evaluation as inpatient given worsened microcytic anemia, persistent leukocytosis and now recurrent thromboembolic event. Further eval of unintentional weight loss per IM.  Signed, Melina Copa PA-C

## 2014-09-28 NOTE — Progress Notes (Signed)
ANTICOAGULATION CONSULT NOTE - Follow Up Consult  Pharmacy Consult for heparin Indication: pulmonary embolus  Labs:  Recent Labs  09/27/14 1444 09/27/14 1926 09/28/14 0253  HGB 8.2*  --   --   HCT 22.7*  --   --   PLT 329  --   --   LABPROT 15.0 14.7  --   INR 1.17 1.13  --   HEPARINUNFRC  --   --  0.74*  CREATININE 4.90*  --   --      Assessment: 48yo male supratherapeutic on heparin with initial dosing for possible PE.  Goal of Therapy:  Heparin level 0.3-0.7 units/ml   Plan:  Will decrease heparin gtt by ~1 unit/kg/hr to 1600 units/hr and check level in [place], PharmD, BCPS  09/28/2014,3:53 AM

## 2014-09-29 LAB — CBC
HCT: 21.7 % — ABNORMAL LOW (ref 39.0–52.0)
Hemoglobin: 7.7 g/dL — ABNORMAL LOW (ref 13.0–17.0)
MCH: 27.1 pg (ref 26.0–34.0)
MCHC: 35.5 g/dL (ref 30.0–36.0)
MCV: 76.4 fL — ABNORMAL LOW (ref 78.0–100.0)
Platelets: 281 10*3/uL (ref 150–400)
RBC: 2.84 MIL/uL — ABNORMAL LOW (ref 4.22–5.81)
RDW: 19.1 % — ABNORMAL HIGH (ref 11.5–15.5)
WBC: 15.3 10*3/uL — ABNORMAL HIGH (ref 4.0–10.5)

## 2014-09-29 LAB — BASIC METABOLIC PANEL
Anion gap: 6 (ref 5–15)
BUN: 51 mg/dL — ABNORMAL HIGH (ref 6–23)
CO2: 16 mmol/L — ABNORMAL LOW (ref 19–32)
Calcium: 7.8 mg/dL — ABNORMAL LOW (ref 8.4–10.5)
Chloride: 118 mEq/L — ABNORMAL HIGH (ref 96–112)
Creatinine, Ser: 4.38 mg/dL — ABNORMAL HIGH (ref 0.50–1.35)
GFR calc Af Amer: 17 mL/min — ABNORMAL LOW (ref >90)
GFR calc non Af Amer: 14 mL/min — ABNORMAL LOW (ref >90)
Glucose, Bld: 65 mg/dL — ABNORMAL LOW (ref 70–99)
Potassium: 5.1 mmol/L (ref 3.5–5.1)
Sodium: 140 mmol/L (ref 135–145)

## 2014-09-29 LAB — GLUCOSE, CAPILLARY
Glucose-Capillary: 63 mg/dL — ABNORMAL LOW (ref 70–99)
Glucose-Capillary: 98 mg/dL (ref 70–99)
Glucose-Capillary: 109 mg/dL — ABNORMAL HIGH (ref 70–99)
Glucose-Capillary: 83 mg/dL (ref 70–99)
Glucose-Capillary: 91 mg/dL (ref 70–99)

## 2014-09-29 LAB — PROTEIN ELECTROPHORESIS, SERUM
Albumin ELP: 49.8 % — ABNORMAL LOW (ref 55.8–66.1)
Alpha-1-Globulin: 4.8 % (ref 2.9–4.9)
Alpha-2-Globulin: 8.7 % (ref 7.1–11.8)
Beta 2: 2.8 % — ABNORMAL LOW (ref 3.2–6.5)
Beta Globulin: 2.7 % — ABNORMAL LOW (ref 4.7–7.2)
Gamma Globulin: 31.2 % — ABNORMAL HIGH (ref 11.1–18.8)
M-Spike, %: 1.98 g/dL
Total Protein ELP: 9.3 g/dL — ABNORMAL HIGH (ref 6.0–8.3)

## 2014-09-29 LAB — PROTIME-INR
INR: 1.14 (ref 0.00–1.49)
Prothrombin Time: 14.8 seconds (ref 11.6–15.2)

## 2014-09-29 LAB — RETICULOCYTES
RBC.: 3.07 MIL/uL — ABNORMAL LOW (ref 4.22–5.81)
Retic Count, Absolute: 184.2 10*3/uL (ref 19.0–186.0)
Retic Ct Pct: 6 % — ABNORMAL HIGH (ref 0.4–3.1)

## 2014-09-29 LAB — IRON AND TIBC
Iron: 80 ug/dL (ref 42–165)
Saturation Ratios: 30 % (ref 20–55)
TIBC: 266 ug/dL (ref 215–435)
UIBC: 186 ug/dL (ref 125–400)

## 2014-09-29 LAB — HEPARIN LEVEL (UNFRACTIONATED)
Heparin Unfractionated: 0.21 IU/mL — ABNORMAL LOW (ref 0.30–0.70)
Heparin Unfractionated: 0.68 IU/mL (ref 0.30–0.70)

## 2014-09-29 MED ORDER — INSULIN GLARGINE 100 UNIT/ML ~~LOC~~ SOLN
20.0000 [IU] | Freq: Every day | SUBCUTANEOUS | Status: DC
  Administered 2014-09-29: 20 [IU] via SUBCUTANEOUS
  Filled 2014-09-29 (×2): qty 0.2

## 2014-09-29 MED ORDER — WARFARIN SODIUM 10 MG PO TABS
10.0000 mg | ORAL_TABLET | Freq: Once | ORAL | Status: AC
  Administered 2014-09-29: 10 mg via ORAL
  Filled 2014-09-29: qty 1

## 2014-09-29 NOTE — Progress Notes (Addendum)
ANTICOAGULATION CONSULT NOTE   Pharmacy Consult for Heparin Indication: Pulmonary Embolism  Allergies  Allergen Reactions  . Codeine Rash and Other (See Comments)    Unknown reaction (patient says it was more serious than just a rash, but he can't remember what happened)    Patient Measurements: Height: 5' 10"  (177.8 cm) Weight: 258 lb 6.4 oz (117.209 kg) IBW/kg (Calculated) : 73 Heparin Dosing Weight: 99.4 kg  Vital Signs: Temp: 97.9 F (36.6 C) (01/22 1345) Temp Source: Oral (01/22 1345) BP: 123/60 mmHg (01/22 1345) Pulse Rate: 85 (01/22 1345)  Labs:  Recent Labs  09/27/14 1444 09/27/14 1926  09/28/14 0453  09/28/14 2045 09/29/14 0454 09/29/14 0500 09/29/14 1522  HGB 8.2*  --   --  7.7*  --   --  7.7*  --   --   HCT 22.7*  --   --  22.0*  --   --  21.7*  --   --   PLT 329  --   --  291  --   --  281  --   --   LABPROT 15.0 14.7  --   --   --   --  14.8  --   --   INR 1.17 1.13  --   --   --   --  1.14  --   --   HEPARINUNFRC  --   --   < >  --   < > 0.53  --  0.21* 0.68  CREATININE 4.90*  --   --  4.55*  --   --  4.38*  --   --   < > = values in this interval not displayed.  Estimated Creatinine Clearance: 25.9 mL/min (by C-G formula based on Cr of 4.38).   Infusions:  . heparin 1,650 Units/hr (09/29/14 1036)   Assessment:  40 YOM w/ PMH of unprovoked bilateral PE '12, CKD, monoclonal gammopathy, anemia, sickle cell trait, and obesity presenting to [facility] on 09/27/14 c/o SOB on exertion.  VQ scan shows intermediate probability of PE, dopplers positive for DVT in left peroneal veins.   Heparin is at goal (HL= 0.68) on 1650 units/hr noted on day 2 overlap of heparin/coumadin. Prior heparin level was 0.21 on 1400 units/hr.  Goal of Therapy:  INR goal 2-3 Heparin level 0.3-0.7 units/ml Monitor platelets by anticoagulation protocol: Yes   Plan:   -Decrease heparin slightly to 1600 units/hr -Daily heparin level and CBC  Hildred Laser, Pharm D 09/29/2014 4:21  PM

## 2014-09-29 NOTE — Progress Notes (Signed)
[place] for Heparin/Coumadin Indication: Pulmonary Embolism  Allergies  Allergen Reactions  . Codeine Rash and Other (See Comments)    Unknown reaction (patient says it was more serious than just a rash, but he can't remember what happened)    Patient Measurements: Height: 5' 10"  (177.8 cm) Weight: 258 lb 6.4 oz (117.209 kg) IBW/kg (Calculated) : 73 Heparin Dosing Weight: 99.4 kg  Vital Signs: Temp: 97.9 F (36.6 C) (01/22 0524) Temp Source: Oral (01/22 0524) BP: 132/79 mmHg (01/22 0524) Pulse Rate: 86 (01/22 0524)  Labs:  Recent Labs  09/27/14 1444 09/27/14 1926  09/28/14 0453 09/28/14 1158 09/28/14 2045 09/29/14 0454 09/29/14 0500  HGB 8.2*  --   --  7.7*  --   --  7.7*  --   HCT 22.7*  --   --  22.0*  --   --  21.7*  --   PLT 329  --   --  291  --   --  281  --   LABPROT 15.0 14.7  --   --   --   --  14.8  --   INR 1.17 1.13  --   --   --   --  1.14  --   HEPARINUNFRC  --   --   < >  --  0.83* 0.53  --  0.21*  CREATININE 4.90*  --   --  4.55*  --   --  4.38*  --   < > = values in this interval not displayed.  Estimated Creatinine Clearance: 25.9 mL/min (by C-G formula based on Cr of 4.38).  Assessment: 49 yo male with PE/DVT for anticoagulation  Goal of Therapy:  INR goal 2-3 Heparin level 0.3-0.7 units/ml Monitor platelets by anticoagulation protocol: Yes   Plan:   Increase Heparin 1650 units/hr Check heparin level in 8 hours.  Coumadin 10 mg today  Phillis Knack, PharmD, BCPS   09/29/2014 6:47 AM

## 2014-09-29 NOTE — Progress Notes (Addendum)
[facility] PROGRESS NOTE  [facility] KZL:[id number] DOB: Apr 15, 1966 DOA: 09/27/2014 PCP: Maggie Font, MD  Assessment/Plan: 1. Pulmonary embolism: Admitted to telemetry. Started on IV heparin and coumadin.  Echocardiogram ordered and lower extremity dopplers show DVT in the left peroneal veins.  Echocardiogram showed LVEF of 65% and mild LVH. With grade 1 diastolic dysfunction. Pulmonary artery pressures of 67 mmhg.  INR subtherapeutic.    Stage 4 CKD: Holding lasix. Probably from diabetes and MGUS.  Recommend outpatient follow up with nephrology.    MGUS: SPEP ordered.  Outpatient follow up with hematology.  Will tag Dr Alen Blew to the list.   Type 2 DM: SSI.  He had an episode of hypoglycemia this am.  Decreased the dos eof lantus to 20 units tonight.  Continue SSI.    Leukocytosis:  probably reactive. Afebrile. UA ordered. No cough or symptoms of upper respiratory tract. No nausea or vomiting or diarrhea.    Anemia: Baseline hemoglobin is between 9 to 10. His Hemoglobin this admission is 8.2, will get anemia panel and stool for occult blood. Repeat hemoglobin tomorrow and transfuse as needed.         Code Status: full code.  Family Communication: none at bedside.  Disposition Plan: pending.    Consultants:  Cardiology  Hematology.   Procedures: V/ q scan.  Antibiotics:  none  HPI/Subjective: Sob improved. No nausea or vomiting. Wants to know when he can go home.  Objective: Filed Vitals:   09/29/14 1345  BP: 123/60  Pulse: 85  Temp: 97.9 F (36.6 C)  Resp: 20    Intake/Output Summary (Last 24 hours) at 09/29/14 1659 Last data filed at 09/29/14 1300  Gross per 24 hour  Intake   2506 ml  Output   2751 ml  Net   -245 ml   Filed Weights   09/27/14 1352 09/27/14 1813 09/28/14 0500  Weight: 118.389 kg (261 lb) 116.257 kg (256 lb 4.8 oz) 117.209 kg (258 lb 6.4 oz)    Exam:   General:  Alert afebrile comfortable lying in  bed  Cardiovascular: s1s2, no murmers  Respiratory: ctab, no wheezing or rhonchi, good air entry.   Abdomen: soft non tender non distended bowel sounds heard  Musculoskeletal: no pedal edema.   Data Reviewed: Basic Metabolic Panel:  Recent Labs Lab 09/27/14 1444 09/28/14 0453 09/28/14 1158 09/29/14 0454  NA 139 139  --  140  K 5.0 5.1  --  5.1  CL 115* 117*  --  118*  CO2 16* 15*  --  16*  GLUCOSE 186* 97  --  65*  BUN 59* 55*  --  51*  CREATININE 4.90* 4.55*  --  4.38*  CALCIUM 8.2* 7.8*  --  7.8*  MG  --   --  1.5  --    Liver Function Tests: No results for input(s): AST, ALT, ALKPHOS, BILITOT, PROT, ALBUMIN in the last 168 hours. No results for input(s): LIPASE, AMYLASE in the last 168 hours. No results for input(s): AMMONIA in the last 168 hours. CBC:  Recent Labs Lab 09/27/14 1444 09/28/14 0453 09/29/14 0454  WBC 17.8* 16.8* 15.3*  HGB 8.2* 7.7* 7.7*  HCT 22.7* 22.0* 21.7*  MCV 74.4* 76.7* 76.4*  PLT 329 291 281   Cardiac Enzymes: No results for input(s): CKTOTAL, CKMB, CKMBINDEX, TROPONINI in the last 168 hours. BNP (last 3 results) No results for input(s): PROBNP in the last 8760 hours. CBG:  Recent Labs Lab 09/28/14 2119 09/29/14 [phone] 09/29/14  0645 09/29/14 1120 09/29/14 1644  GLUCAP 163* 63* 98 109* 83    No results found for this or any previous visit (from the past 240 hour(s)).   Studies: No results found.  Scheduled Meds: . amLODipine  10 mg Oral Daily  . insulin aspart  0-9 Units Subcutaneous TID WC  . insulin glargine  20 Units Subcutaneous QHS  . sodium chloride  3 mL Intravenous Q12H  . warfarin  10 mg Oral ONCE-1800  . Warfarin - Pharmacist Dosing Inpatient   Does not apply q1800   Continuous Infusions: . heparin 1,600 Units/hr (09/29/14 1645)    Principal Problem:   Pulmonary embolus Active Problems:   SOB (shortness of breath)   DM (diabetes mellitus) type II controlled with renal manifestation   HTN  (hypertension)   CKD (chronic kidney disease), stage IV   MGUS (monoclonal gammopathy of unknown significance)   OSA on CPAP    Time spent: 25 min, which includes 50% of the time with face-to-face with patient/ and coordinating care related to the above assessment and plan    [name]  [facility] Pager [phone] If 7PM-7AM, please contact night-coverage at [email or web address], password [facility] 09/29/2014, 4:59 PM  LOS: 2 days

## 2014-09-29 NOTE — Progress Notes (Signed)
Inpatient Diabetes Program Recommendations  AACE/ADA: New Consensus Statement on Inpatient Glycemic Control (2013)  Target Ranges:  Prepandial:   less than 140 mg/dL      Peak postprandial:   less than 180 mg/dL (1-2 hours)      Critically ill patients:  140 - 180  mg/dL   Home lantus 40 units at HS and Humalog 10-12 units tidwc.  Hypoglycemia this am at 65 mg/dl With CKD IV, may want less stringent control while here. Inpatient Diabetes Program Recommendations Insulin - Basal: Please consider decrease in lantus to 30 units at this time while here  Thank you, Rosita Kea, RN, CNS, Diabetes Coordinator [phone])

## 2014-09-29 NOTE — Progress Notes (Signed)
Pt. Refused cpap for tonight. RT informed pt. To notify if he changes his mind.

## 2014-09-30 LAB — BASIC METABOLIC PANEL
Anion gap: 5 (ref 5–15)
BUN: 46 mg/dL — ABNORMAL HIGH (ref 6–23)
CO2: 17 mmol/L — ABNORMAL LOW (ref 19–32)
Calcium: 8.1 mg/dL — ABNORMAL LOW (ref 8.4–10.5)
Chloride: 117 mmol/L — ABNORMAL HIGH (ref 96–112)
Creatinine, Ser: 4.26 mg/dL — ABNORMAL HIGH (ref 0.50–1.35)
GFR calc Af Amer: 17 mL/min — ABNORMAL LOW (ref >90)
GFR calc non Af Amer: 15 mL/min — ABNORMAL LOW (ref >90)
Glucose, Bld: 66 mg/dL — ABNORMAL LOW (ref 70–99)
Potassium: 4.7 mmol/L (ref 3.5–5.1)
Sodium: 139 mmol/L (ref 135–145)

## 2014-09-30 LAB — CBC
HCT: 22.1 % — ABNORMAL LOW (ref 39.0–52.0)
Hemoglobin: 7.9 g/dL — ABNORMAL LOW (ref 13.0–17.0)
MCH: 26.7 pg (ref 26.0–34.0)
MCHC: 35.7 g/dL (ref 30.0–36.0)
MCV: 74.7 fL — ABNORMAL LOW (ref 78.0–100.0)
Platelets: 320 10*3/uL (ref 150–400)
RBC: 2.96 MIL/uL — ABNORMAL LOW (ref 4.22–5.81)
RDW: 18.9 % — ABNORMAL HIGH (ref 11.5–15.5)
WBC: 15.8 10*3/uL — ABNORMAL HIGH (ref 4.0–10.5)

## 2014-09-30 LAB — FOLATE: Folate: 14.4 ng/mL

## 2014-09-30 LAB — HEPARIN LEVEL (UNFRACTIONATED): Heparin Unfractionated: 0.62 IU/mL (ref 0.30–0.70)

## 2014-09-30 LAB — GLUCOSE, CAPILLARY
Glucose-Capillary: 58 mg/dL — ABNORMAL LOW (ref 70–99)
Glucose-Capillary: 88 mg/dL (ref 70–99)
Glucose-Capillary: 81 mg/dL (ref 70–99)
Glucose-Capillary: 92 mg/dL (ref 70–99)
Glucose-Capillary: 83 mg/dL (ref 70–99)

## 2014-09-30 LAB — PROTIME-INR
INR: 1.23 (ref 0.00–1.49)
Prothrombin Time: 15.6 seconds — ABNORMAL HIGH (ref 11.6–15.2)

## 2014-09-30 LAB — VITAMIN B12: Vitamin B-12: 393 pg/mL (ref 211–911)

## 2014-09-30 LAB — FERRITIN: Ferritin: 146 ng/mL (ref 22–322)

## 2014-09-30 MED ORDER — INSULIN GLARGINE 100 UNIT/ML ~~LOC~~ SOLN
5.0000 [IU] | Freq: Every day | SUBCUTANEOUS | Status: DC
  Administered 2014-09-30: 5 [IU] via SUBCUTANEOUS
  Filled 2014-09-30 (×2): qty 0.05

## 2014-09-30 MED ORDER — INSULIN GLARGINE 100 UNIT/ML ~~LOC~~ SOLN
10.0000 [IU] | Freq: Every day | SUBCUTANEOUS | Status: DC
  Filled 2014-09-30: qty 0.1

## 2014-09-30 MED ORDER — WARFARIN SODIUM 7.5 MG PO TABS
15.0000 mg | ORAL_TABLET | Freq: Once | ORAL | Status: AC
  Administered 2014-09-30: 15 mg via ORAL
  Filled 2014-09-30: qty 2

## 2014-09-30 NOTE — Discharge Instructions (Signed)
Information on my medicine - Coumadin®   (Warfarin) ° °Why was Coumadin prescribed for you? °Coumadin was prescribed for you because you have a blood clot or a medical condition that can cause an increased risk of forming blood clots. Blood clots can cause serious health problems by blocking the flow of blood to the heart, lung, or brain. Coumadin can prevent harmful blood clots from forming. °As a reminder your indication for Coumadin is:   Pulmonary Embolism Treatment ° °What test will check on my response to Coumadin? °While on Coumadin (warfarin) you will need to have an INR test regularly to ensure that your dose is keeping you in the desired range. The INR (international normalized ratio) number is calculated from the result of the laboratory test called prothrombin time (PT). ° °If an INR APPOINTMENT HAS NOT ALREADY BEEN MADE FOR YOU please schedule an appointment to have this lab work done by your health care provider within 7 days. °Your INR goal is usually a number between:  2 to 3 or your provider may give you a more narrow range like 2-2.5.  Ask your health care provider during an office visit what your goal INR is. ° °What  do you need to  know  About  COUMADIN? °Take Coumadin (warfarin) exactly as prescribed by your healthcare provider about the same time each day.  DO NOT stop taking without talking to the doctor who prescribed the medication.  Stopping without other blood clot prevention medication to take the place of Coumadin may increase your risk of developing a new clot or stroke.  Get refills before you run out. ° °What do you do if you miss a dose? °If you miss a dose, take it as soon as you remember on the same day then continue your regularly scheduled regimen the next day.  Do not take two doses of Coumadin at the same time. ° °Important Safety Information °A possible side effect of Coumadin (Warfarin) is an increased risk of bleeding. You should call your healthcare provider right away if  you experience any of the following: °? Bleeding from an injury or your nose that does not stop. °? Unusual colored urine (red or dark brown) or unusual colored stools (red or black). °? Unusual bruising for unknown reasons. °? A serious fall or if you hit your head (even if there is no bleeding). ° °Some foods or medicines interact with Coumadin® (warfarin) and might alter your response to warfarin. To help avoid this: °? Eat a balanced diet, maintaining a consistent amount of Vitamin K. °? Notify your provider about major diet changes you plan to make. °? Avoid alcohol or limit your intake to 1 drink for women and 2 drinks for men per day. °(1 drink is 5 oz. wine, 12 oz. beer, or 1.5 oz. liquor.) ° °Make sure that ANY health care provider who prescribes medication for you knows that you are taking Coumadin (warfarin).  Also make sure the healthcare provider who is monitoring your Coumadin knows when you have started a new medication including herbals and non-prescription products. ° °Coumadin® (Warfarin)  Major Drug Interactions  °Increased Warfarin Effect Decreased Warfarin Effect  °Alcohol (large quantities) °Antibiotics (esp. Septra/Bactrim, Flagyl, Cipro) °Amiodarone (Cordarone) °Aspirin (ASA) °Cimetidine (Tagamet) °Megestrol (Megace) °NSAIDs (ibuprofen, naproxen, etc.) °Piroxicam (Feldene) °Propafenone (Rythmol SR) °Propranolol (Inderal) °Isoniazid (INH) °Posaconazole (Noxafil) Barbiturates (Phenobarbital) °Carbamazepine (Tegretol) °Chlordiazepoxide (Librium) °Cholestyramine (Questran) °Griseofulvin °Oral Contraceptives °Rifampin °Sucralfate (Carafate) °Vitamin K  ° °Coumadin® (Warfarin) Major Herbal Interactions  °Increased   Warfarin Effect Decreased Warfarin Effect  °Garlic °Ginseng °Ginkgo biloba Coenzyme Q10 °Green tea °St. John’s wort   ° °Coumadin® (Warfarin) FOOD Interactions  °Eat a consistent number of servings per week of foods HIGH in Vitamin K °(1 serving = ½ cup)  °Collards (cooked, or boiled &  drained) °Kale (cooked, or boiled & drained) °Mustard greens (cooked, or boiled & drained) °Parsley *serving size only = ¼ cup °Spinach (cooked, or boiled & drained) °Swiss chard (cooked, or boiled & drained) °Turnip greens (cooked, or boiled & drained)  °Eat a consistent number of servings per week of foods MEDIUM-HIGH in Vitamin K °(1 serving = 1 cup)  °Asparagus (cooked, or boiled & drained) °Broccoli (cooked, boiled & drained, or raw & chopped) °Brussel sprouts (cooked, or boiled & drained) *serving size only = ½ cup °Lettuce, raw (green leaf, endive, romaine) °Spinach, raw °Turnip greens, raw & chopped  ° °These websites have more information on Coumadin (warfarin):  www.coumadin.com; °www.ahrq.gov/consumer/coumadin.htm; ° ° ° °

## 2014-09-30 NOTE — Progress Notes (Signed)
CARE MANAGEMENT NOTE 09/30/2014  Patient:  Isaiah [name],Isaiah [name]   Account Number:  [id number]  Date Initiated:  09/30/2014  Documentation initiated by:  [facility]  Subjective/Objective Assessment:   PE     Action/Plan:   Anticipated DC Date:  09/25/2014   Anticipated DC Plan:  [place]  CM consult      Choice offered to / List presented to:             Status of service:  Completed, signed off Medicare Important Message given?   (If response is "NO", the following Medicare IM given date fields will be blank) Date Medicare IM given:   Medicare IM given by:   Date Additional Medicare IM given:   Additional Medicare IM given by:    Discharge Disposition:  HOME/SELF CARE  Per UR Regulation:    If discussed at Long Length of Stay Meetings, dates discussed:    Comments:  09/30/2014 1700 NCM spoke to pt and he prefers Xarelto. Provided pt with Xarelto 30 day free trial card and $0 copay card. 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 Jonnie Finner RN CCM Case Mgmt phone [phone]

## 2014-09-30 NOTE — Progress Notes (Signed)
ANTICOAGULATION CONSULT NOTE   Pharmacy Consult for Heparin Indication: Pulmonary Embolism  Allergies  Allergen Reactions  . Codeine Rash and Other (See Comments)    Unknown reaction (patient says it was more serious than just a rash, but he can't remember what happened)    Patient Measurements: Height: 5' 10"  (177.8 cm) Weight: 258 lb 6.4 oz (117.209 kg) IBW/kg (Calculated) : 73 Heparin Dosing Weight: 99.4 kg  Vital Signs: Temp: 98.2 F (36.8 C) (01/23 0453) Temp Source: Oral (01/23 0453) BP: 128/70 mmHg (01/23 0453) Pulse Rate: 88 (01/23 0453)  Labs:  Recent Labs  09/27/14 1926  09/28/14 0453  09/29/14 0454 09/29/14 0500 09/29/14 1522 09/30/14 0556  HGB  --   --  7.7*  --  7.7*  --   --  7.9*  HCT  --   --  22.0*  --  21.7*  --   --  22.1*  PLT  --   --  291  --  281  --   --  320  LABPROT 14.7  --   --   --  14.8  --   --  15.6*  INR 1.13  --   --   --  1.14  --   --  1.23  HEPARINUNFRC  --   < >  --   < >  --  0.21* 0.68 0.62  CREATININE  --   --  4.55*  --  4.38*  --   --  4.26*  < > = values in this interval not displayed.  Estimated Creatinine Clearance: 26.6 mL/min (by C-G formula based on Cr of 4.26).   Infusions:  . heparin 1,600 Units/hr (09/30/14 0313)   Assessment: 54 YOM w/ PMH of unprovoked bilateral PE '12, CKD, monoclonal gammopathy, anemia, sickle cell trait, and obesity presenting to [facility] on 09/27/14 c/o SOB on exertion.  VQ scan shows intermediate probability of PE, dopplers positive for DVT in left peroneal veins.   Heparin is at goal (HL= 0.6) on 1600 units/hr noted on day 3 overlap of heparin/coumadin.  INR slowly trending up to 1.23 this morning, will give extra warfarin tonight.   Goal of Therapy:  INR goal 2-3 Heparin level 0.3-0.7 units/ml Monitor platelets by anticoagulation protocol: Yes   Plan:   -Continue heparin at 1600 units/hr -Daily heparin level and CBC -Warfarin 42m tonight  FErin HearingPharmD., BCPS Clinical  Pharmacist Pager [phone]/23/2016 10:44 AM

## 2014-09-30 NOTE — Progress Notes (Signed)
Pt refuses CPAP. Understands to call if he changes his mind.

## 2014-09-30 NOTE — Progress Notes (Signed)
Pts CBG is 83. Pt due to receive 10 units of Lantus. Jonette Eva, NP made aware and orders changed. Will medicate with the decreased dose of Lantus and make sure Pt has a snack. Will cont to monitor.

## 2014-09-30 NOTE — Progress Notes (Signed)
[facility] PROGRESS NOTE  [facility] FFM:[id number] DOB: 1966-09-03 DOA: 09/27/2014 PCP: Maggie Font, MD  Assessment/Plan: 1. Pulmonary embolism: Admitted to telemetry. Started on IV heparin and coumadin.  Echocardiogram ordered and lower extremity dopplers show DVT in the left peroneal veins.  Echocardiogram showed LVEF of 65% and mild LVH. With grade 1 diastolic dysfunction. Pulmonary artery pressures of 67 mmhg.  INR subtherapeutic. Patient wants to know if he can be started on NOAC, willg et case manager on board to see how much co pay he has to pay for xarelto or eliquis.    Stage 4 CKD: Holding lasix. Probably from diabetes and MGUS.  Recommend outpatient follow up with nephrology.    MGUS: SPEP ordered.  Outpatient follow up with hematology.  Will tag Dr Alen Blew to the patient's file.   Type 2 DM: SSI.  He had an episode of hypoglycemia this am.  Initially Decreased the dos eof lantus to 20 units tonight, will further decrease it to 10 units at qhs. .  Continue SSI.    Leukocytosis:  probably reactive. Afebrile. UA ordered. No cough or symptoms of upper respiratory tract. No nausea or vomiting or diarrhea.    Anemia: Baseline hemoglobin is between 9 to 10. His Hemoglobin this admission is 8.2, will get anemia panel and stool for occult blood. Repeat hemoglobin tomorrow and transfuse as needed.         Code Status: full code.  Family Communication: none at bedside.  Disposition Plan: pending.    Consultants:  Cardiology  Hematology.   Procedures: V/ q scan.  Antibiotics:  none  HPI/Subjective: Sob improved. No nausea or vomiting. Wants to know how much he has to pay if he chose to be on NOAC.  Objective: Filed Vitals:   09/30/14 1356  BP: 125/72  Pulse: 67  Temp: 98.5 F (36.9 C)  Resp: 18    Intake/Output Summary (Last 24 hours) at 09/30/14 1525 Last data filed at 09/30/14 1300  Gross per 24 hour  Intake   1091 ml  Output    3375 ml  Net  -2284 ml   Filed Weights   09/27/14 1352 09/27/14 1813 09/28/14 0500  Weight: 118.389 kg (261 lb) 116.257 kg (256 lb 4.8 oz) 117.209 kg (258 lb 6.4 oz)    Exam:   General:  Alert afebrile comfortable lying in bed  Cardiovascular: s1s2, no murmers  Respiratory: ctab, no wheezing or rhonchi, good air entry.   Abdomen: soft non tender non distended bowel sounds heard  Musculoskeletal: no pedal edema.   Data Reviewed: Basic Metabolic Panel:  Recent Labs Lab 09/27/14 1444 09/28/14 0453 09/28/14 1158 09/29/14 0454 09/30/14 0556  NA 139 139  --  140 139  K 5.0 5.1  --  5.1 4.7  CL 115* 117*  --  118* 117*  CO2 16* 15*  --  16* 17*  GLUCOSE 186* 97  --  65* 66*  BUN 59* 55*  --  51* 46*  CREATININE 4.90* 4.55*  --  4.38* 4.26*  CALCIUM 8.2* 7.8*  --  7.8* 8.1*  MG  --   --  1.5  --   --    Liver Function Tests: No results for input(s): AST, ALT, ALKPHOS, BILITOT, PROT, ALBUMIN in the last 168 hours. No results for input(s): LIPASE, AMYLASE in the last 168 hours. No results for input(s): AMMONIA in the last 168 hours. CBC:  Recent Labs Lab 09/27/14 1444 09/28/14 0453 09/29/14 0454 09/30/14 5701  WBC 17.8* 16.8* 15.3* 15.8*  HGB 8.2* 7.7* 7.7* 7.9*  HCT 22.7* 22.0* 21.7* 22.1*  MCV 74.4* 76.7* 76.4* 74.7*  PLT 329 291 281 320   Cardiac Enzymes: No results for input(s): CKTOTAL, CKMB, CKMBINDEX, TROPONINI in the last 168 hours. BNP (last 3 results) No results for input(s): PROBNP in the last 8760 hours. CBG:  Recent Labs Lab 09/29/14 1644 09/29/14 2057 09/30/14 0619 09/30/14 0706 09/30/14 1145  GLUCAP 83 91 58* 88 81    No results found for this or any previous visit (from the past 240 hour(s)).   Studies: No results found.  Scheduled Meds: . amLODipine  10 mg Oral Daily  . insulin aspart  0-9 Units Subcutaneous TID WC  . insulin glargine  20 Units Subcutaneous QHS  . sodium chloride  3 mL Intravenous Q12H  . warfarin  15 mg Oral  ONCE-1800  . Warfarin - Pharmacist Dosing Inpatient   Does not apply q1800   Continuous Infusions: . heparin 1,600 Units/hr (09/30/14 0313)    Principal Problem:   Pulmonary embolus Active Problems:   SOB (shortness of breath)   DM (diabetes mellitus) type II controlled with renal manifestation   HTN (hypertension)   CKD (chronic kidney disease), stage IV   MGUS (monoclonal gammopathy of unknown significance)   OSA on CPAP    Time spent: 25 min, which includes 50% of the time with face-to-face with patient/ and coordinating care related to the above assessment and plan    [facility]  [facility] Pager [phone] If 7PM-7AM, please contact night-coverage at [email or web address], password [facility] 09/30/2014, 3:25 PM  LOS: 3 days

## 2014-09-30 NOTE — Progress Notes (Signed)
Patient continues to sleep comfortably. Maintained on telemetry and has been in NSR on the monitor. Heparin drip continue to infuse at 16 ml/hr. Will continue to monitor.  Esperanza Heir, RN

## 2014-09-30 NOTE — Progress Notes (Signed)
Patient's blood sugar this Am is 58. OJ given . Will recheck and continue to monitor.  Esperanza Heir, RN

## 2014-09-30 NOTE — Progress Notes (Signed)
Patient's recheck of blood sugar is 88.  Esperanza Heir, RN

## 2014-10-01 LAB — URINALYSIS, ROUTINE W REFLEX MICROSCOPIC
Bilirubin Urine: NEGATIVE
Glucose, UA: NEGATIVE mg/dL
Ketones, ur: NEGATIVE mg/dL
Leukocytes, UA: NEGATIVE
Nitrite: NEGATIVE
Protein, ur: 100 mg/dL — AB
Specific Gravity, Urine: 1.006 (ref 1.005–1.030)
Urobilinogen, UA: 0.2 mg/dL (ref 0.0–1.0)
pH: 6 (ref 5.0–8.0)

## 2014-10-01 LAB — GLUCOSE, CAPILLARY
Glucose-Capillary: 68 mg/dL — ABNORMAL LOW (ref 70–99)
Glucose-Capillary: 122 mg/dL — ABNORMAL HIGH (ref 70–99)
Glucose-Capillary: 91 mg/dL (ref 70–99)

## 2014-10-01 LAB — PROTIME-INR
INR: 1.52 — ABNORMAL HIGH (ref 0.00–1.49)
Prothrombin Time: 18.4 seconds — ABNORMAL HIGH (ref 11.6–15.2)

## 2014-10-01 LAB — BASIC METABOLIC PANEL
Anion gap: 9 (ref 5–15)
BUN: 44 mg/dL — ABNORMAL HIGH (ref 6–23)
CO2: 13 mmol/L — ABNORMAL LOW (ref 19–32)
Calcium: 8.2 mg/dL — ABNORMAL LOW (ref 8.4–10.5)
Chloride: 114 mmol/L — ABNORMAL HIGH (ref 96–112)
Creatinine, Ser: 4.12 mg/dL — ABNORMAL HIGH (ref 0.50–1.35)
GFR calc Af Amer: 18 mL/min — ABNORMAL LOW (ref >90)
GFR calc non Af Amer: 16 mL/min — ABNORMAL LOW (ref >90)
Glucose, Bld: 90 mg/dL (ref 70–99)
Potassium: 4.9 mmol/L (ref 3.5–5.1)
Sodium: 136 mmol/L (ref 135–145)

## 2014-10-01 LAB — URINE MICROSCOPIC-ADD ON

## 2014-10-01 LAB — HEPARIN LEVEL (UNFRACTIONATED): Heparin Unfractionated: 0.67 IU/mL (ref 0.30–0.70)

## 2014-10-01 LAB — HEMOGLOBIN A1C
Hgb A1c MFr Bld: 4.8 % (ref <5.7)
Mean Plasma Glucose: 91 mg/dL (ref <117)

## 2014-10-01 MED ORDER — ENOXAPARIN SODIUM 120 MG/0.8ML ~~LOC~~ SOLN: 120.0000 mg | Freq: Every day | SUBCUTANEOUS | Status: DC

## 2014-10-01 MED ORDER — WARFARIN SODIUM 10 MG PO TABS: 10.0000 mg | ORAL_TABLET | Freq: Every day | ORAL | Status: DC

## 2014-10-01 MED ORDER — WARFARIN SODIUM 7.5 MG PO TABS
15.0000 mg | ORAL_TABLET | Freq: Once | ORAL | Status: AC
  Administered 2014-10-01: 15 mg via ORAL
  Filled 2014-10-01: qty 2

## 2014-10-01 MED ORDER — ENOXAPARIN (LOVENOX) PATIENT EDUCATION KIT
PACK | Freq: Once | Status: AC
  Administered 2014-10-01: 13:00:00
  Filled 2014-10-01: qty 1

## 2014-10-01 MED ORDER — ENOXAPARIN SODIUM 120 MG/0.8ML ~~LOC~~ SOLN
120.0000 mg | Freq: Every day | SUBCUTANEOUS | Status: DC
  Administered 2014-10-01: 120 mg via SUBCUTANEOUS
  Filled 2014-10-01: qty 0.8

## 2014-10-01 NOTE — Progress Notes (Signed)
10/01/2014 1600 Chart reviewed. Pt will call [facility] to arrange follow up for PT/INR. Pt was not a candidate for Xarelto. Jonnie Finner RN CCM Case Mgmt phone [phone]

## 2014-10-01 NOTE — Progress Notes (Addendum)
[facility] for Heparin and coumadin  Indication: Pulmonary Embolism  Allergies  Allergen Reactions  . Codeine Rash and Other (See Comments)    Unknown reaction (patient says it was more serious than just a rash, but he can't remember what happened)    Patient Measurements: Height: 5' 10"  (177.8 cm) Weight: 257 lb 12.8 oz (116.937 kg) IBW/kg (Calculated) : 73 Heparin Dosing Weight: 99.4 kg  Vital Signs: Temp: 98.6 F (37 C) (01/24 0500) Temp Source: Oral (01/24 0500) BP: 131/71 mmHg (01/24 0500) Pulse Rate: 90 (01/24 0500)  Labs:  Recent Labs  09/29/14 0454  09/29/14 1522 09/30/14 0556 10/01/14 0313  HGB 7.7*  --   --  7.9*  --   HCT 21.7*  --   --  22.1*  --   PLT 281  --   --  320  --   LABPROT 14.8  --   --  15.6* 18.4*  INR 1.14  --   --  1.23 1.52*  HEPARINUNFRC  --   < > 0.68 0.62 0.67  CREATININE 4.38*  --   --  4.26* 4.12*  < > = values in this interval not displayed.  Estimated Creatinine Clearance: 27.5 mL/min (by C-G formula based on Cr of 4.12).   Infusions:  . heparin 1,600 Units/hr (10/01/14 0537)   Assessment: 17 YOM w/ PMH of unprovoked bilateral PE '12, CKD, monoclonal gammopathy, anemia, sickle cell trait, and obesity presenting to [facility] on 09/27/14 c/o SOB on exertion.  VQ scan shows intermediate probability of PE, dopplers positive for DVT in left peroneal veins.   Heparin is at goal (HL= 0.6) on 1600 units/hr noted on day 4 overlap of heparin/coumadin.  INR slowly trending up to 1.5 this morning, will give extra warfarin tonight.  Patient requesting to be transitioned to [place] card given, will continue to follow for orders. At this point wound suggest starting with first dose tonight with dinner.   15 mg bid x 21 days then 66m daily  Calculated crcl ~35 with xarelto cutoff at 324mmin for DVT/PE - patient is borderline on appropriateness.  Goal of Therapy:  INR goal 2-3 Heparin level 0.3-0.7  units/ml Monitor platelets by anticoagulation protocol: Yes   Plan:   -Reduce heparin to 1500 units/hr -Daily heparin level and CBC -Warfarin [id number]  FraErin HearingarmD., BCPS Clinical Pharmacist Pager [phone] [id number]/[phone] 11:06 AM  Addendum:   MD orders received to transition patient over to sq lovenox and take off heparin bridge.  Plan: Will stop heparin now and then restart 120m48m lovenox daily at ~1300 today.  CBC needed q72h while on lovenox.  10/01/2014 11:32 AM

## 2014-10-01 NOTE — Progress Notes (Signed)
Case Manager contacted concerning pt's appt for [facility] check [facility] on Tuesday. Case manager states pt will need to call and set up appt himself. MD and pt updated.

## 2014-10-01 NOTE — Discharge Summary (Signed)
Physician Discharge Summary  [facility] XBM:[id number] DOB: 07-04-1966 DOA: 09/27/2014  PCP: Maggie Font, MD  Admit date: 09/27/2014 Discharge date: 10/01/2014  Time spent: 30 minutes  Recommendations for Outpatient Follow-up:  1. Follow up with Dr Alen Blew in one week.  2. Please check INR in two days , if around 2, please stop the lovenox and coontinue coumadin.   Discharge Diagnoses:  Principal Problem:   Pulmonary embolus Active Problems:   SOB (shortness of breath)   DM (diabetes mellitus) type II controlled with renal manifestation   HTN (hypertension)   CKD (chronic kidney disease), stage IV   MGUS (monoclonal gammopathy of unknown significance)   OSA on CPAP   Discharge Condition: improved.   Diet recommendation: renal diet  Filed Weights   09/27/14 1813 09/28/14 0500 10/01/14 0500  Weight: 116.257 kg (256 lb 4.8 oz) 117.209 kg (258 lb 6.4 oz) 116.937 kg (257 lb 12.8 oz)    History of present illness:  [name] is a 49 y.o. male with past medical history of diabetes, hypertension and pulmonary embolism in 2012 treated with Coumadin for 6 months. Patient said he developed chronic cough and wheezing in the past 6 months and being treated for asthma, in the past several days he endorses DOE as well as cough.. He was seen by his cardiologist prior to admission, and referred him to ED as he was found tbe hypoxic.  In the ED initial evaluation with VQ scan showed intermediate probability for PE with segmental mismatch defect in the right apex. Started on IV heparin drip, admitted for further evaluation.  Hospital Course:  1. Pulmonary embolism: Admitted to telemetry. Started on IV heparin and coumadin.  Echocardiogram ordered and lower extremity dopplers show DVT in the left peroneal veins.  Echocardiogram showed LVEF of 65% and mild LVH. With grade 1 diastolic dysfunction. Pulmonary artery pressures of 67 mmhg.  INR subtherapeutic. Patient wants to know if he can be  started on NOAC, he couldn't be started on NOAC because of his renal function.  He was discharged home on lovenox and coumadin. He was also recommended to follow up with Dr Alen Blew in 1  Week.   Of note he has History of previous PE, treated for 6 months with Coumadin (now off of Coumadin). Had recent long drive about Christmas time to [place], [place], and then to [employer], [place]. Patient started already on anticoagulation and  coagulopathy workup will be postponed.  Stage 4 CKD: Holding lasix. Probably from diabetes and MGUS.  Recommend outpatient follow up with nephrology.    MGUS: SPEP ordered.  Outpatient follow up with hematology.    Type 2 DM:  He had several episodes of hypoglycemia during his hospitalization.  Initially Decreased the dos eof lantus to 20 units tonight,further decreased it to 10 units. He continued to have mild hypoglycemic episodes. hgba1c was checked and it was less than 5. His insulin was discontinued and discussed witht he patient regarding the results. He was also recommended to periodically check his glucose.     Leukocytosis: probably reactive. Afebrile. UA ordered. No cough or symptoms of upper respiratory tract. No nausea or vomiting or diarrhea.    Anemia: Baseline hemoglobin is between 9 to 10. His Hemoglobin this admission is 8.2, will get anemia panel and stool for occult blood. Stool for occult blood could not be obtained. Most likely it is     Procedures:  none  Consultations:  cardiology  Discharge Exam: Filed Vitals:   10/01/14 0500  BP: 131/71  Pulse: 90  Temp: 98.6 F (37 C)  Resp: 18    General: alert afebrile comfortable Cardiovascular: s1s2 Respiratory: ctab  Discharge Instructions   Discharge Instructions    Diet - low sodium heart healthy    Complete by:  As directed      Discharge instructions    Complete by:  As directed   Follow up [facility] and [facility] in 2 days and check INR on Tuesday.   Please follow up with Dr Alen Blew in one week  Please follow up with nephrology in 1 to 2 weeks. Please continue lovenox till INR is 2, after INR is 2, please stop lovenox injections.          Current Discharge Medication List    START taking these medications   Details  enoxaparin (LOVENOX) 120 MG/0.8ML injection Inject 0.8 mLs (120 mg total) into the skin daily. Qty: 5 Syringe, Refills: 0    warfarin (COUMADIN) 10 MG tablet Take 1 tablet (10 mg total) by mouth daily. Qty: 5 tablet, Refills: 0      CONTINUE these medications which have NOT CHANGED   Details  amLODipine (NORVASC) 10 MG tablet Take 10 mg by mouth daily.    aspirin 81 MG chewable tablet Chew 81 mg by mouth daily.    atorvastatin (LIPITOR) 10 MG tablet Take 10 mg by mouth daily.    cetirizine (ZYRTEC) 10 MG tablet Take 10 mg by mouth daily as needed.     furosemide (LASIX) 20 MG tablet Take 20 mg by mouth daily as needed.       STOP taking these medications     allopurinol (ZYLOPRIM) 300 MG tablet      colchicine 0.6 MG tablet      insulin glargine (LANTUS) 100 UNIT/ML injection      insulin lispro (HUMALOG) 100 UNIT/ML injection        Allergies  Allergen Reactions  . Codeine Rash and Other (See Comments)    Unknown reaction (patient says it was more serious than just a rash, but he can't remember what happened)   Follow-up Information    Follow up with [facility], MD. Schedule an appointment as soon as possible for a visit in 2 days.   Specialty:  Family Medicine   Why:  for INR check up.    Contact information:   [address] [place] [address] [phone]       Follow up with [facility], MD. Schedule an appointment as soon as possible for a visit in 1 week.   Specialty:  Oncology   Contact information:   [address] [place] [address] [phone]        The results of significant diagnostics from this hospitalization (including imaging, microbiology, ancillary  and laboratory) are listed below for reference.    Significant Diagnostic Studies: Nm Pulmonary Perf And Vent  09/27/2014   CLINICAL DATA:  49 year old male with shortness of breath. Initial encounter.  EXAM: NUCLEAR MEDICINE VENTILATION - PERFUSION LUNG SCAN  TECHNIQUE: Ventilation images were obtained in multiple projections using inhaled aerosol technetium 99 M DTPA. Perfusion images were obtained in multiple projections after intravenous injection of Tc-86mMAA.  RADIOPHARMACEUTICALS:  40.0 mCi Tc-951mTPA aerosol and 6.0 mCi Tc-9916mA  COMPARISON:  V/Q scan 10/04/2012, and earlier. Chest radiographs 10/04/2012.  FINDINGS: Ventilation: No focal ventilation defect.  Perfusion: Wedge-shaped peripheral segmental defect in the right lung apex. No other perfusion defect.  IMPRESSION: Solitary mismatched segmental defect in the right apex.  Recommend followup chest radiographs with attention to the right apex, but at this point the study meets PIOPED II criteria for Intermediate Probability Of Acute Pulmonary Embolus   Electronically Signed   By: Lars Pinks M.D.   On: 09/27/2014 16:11    Microbiology: No results found for this or any previous visit (from the past 240 hour(s)).   Labs: Basic Metabolic Panel:  Recent Labs Lab 09/27/14 1444 09/28/14 0453 09/28/14 1158 09/29/14 0454 09/30/14 0556 10/01/14 0313  NA 139 139  --  140 139 136  K 5.0 5.1  --  5.1 4.7 4.9  CL 115* 117*  --  118* 117* 114*  CO2 16* 15*  --  16* 17* 13*  GLUCOSE 186* 97  --  65* 66* 90  BUN 59* 55*  --  51* 46* 44*  CREATININE 4.90* 4.55*  --  4.38* 4.26* 4.12*  CALCIUM 8.2* 7.8*  --  7.8* 8.1* 8.2*  MG  --   --  1.5  --   --   --    Liver Function Tests: No results for input(s): AST, ALT, ALKPHOS, BILITOT, PROT, ALBUMIN in the last 168 hours. No results for input(s): LIPASE, AMYLASE in the last 168 hours. No results for input(s): AMMONIA in the last 168 hours. CBC:  Recent Labs Lab 09/27/14 1444  09/28/14 0453 09/29/14 0454 09/30/14 0556  WBC 17.8* 16.8* 15.3* 15.8*  HGB 8.2* 7.7* 7.7* 7.9*  HCT 22.7* 22.0* 21.7* 22.1*  MCV 74.4* 76.7* 76.4* 74.7*  PLT 329 291 281 320   Cardiac Enzymes: No results for input(s): CKTOTAL, CKMB, CKMBINDEX, TROPONINI in the last 168 hours. BNP: BNP (last 3 results) No results for input(s): PROBNP in the last 8760 hours. CBG:  Recent Labs Lab 09/30/14 1601 09/30/14 2108 10/01/14 0620 10/01/14 0656 10/01/14 1125  GLUCAP 92 83 68* 122* 91       Signed:  Deaven Urwin  [facility] 10/01/2014, 11:57 AM

## 2014-10-01 NOTE — Progress Notes (Signed)
Pts CBG this am is 68. Pt asymptomatic. Pt given some juice and a snack. Will cont to monitor.

## 2014-10-02 ENCOUNTER — Telehealth: Payer: Self-pay | Admitting: *Deleted

## 2014-10-02 NOTE — Telephone Encounter (Signed)
Spoke with sylvia, patient's wife. Note to dr Hazeline Junker desk as to where to schedule a f/u appt. He may want to see patient sooner than 1st availabel. Will call sylvia back with appt arrangements 10/03/14.

## 2014-10-02 NOTE — Telephone Encounter (Signed)
Call in to [facility] from pt's wife, [name], stating pt was d/ced from the hospital and instructed to call for an appointment with Dr Alen Blew ASAP- " this week ".  Return call number for [name] given as [phone].  THIS NOTE WILL BE SENT TO DR Alen Blew AND NURSE AT DESK FOR APPROPRIATE FOLLOW AND RETURN CALL TO CAREGIVER.

## 2014-10-03 ENCOUNTER — Other Ambulatory Visit: Payer: Self-pay | Admitting: *Deleted

## 2014-10-05 ENCOUNTER — Encounter: Payer: Self-pay | Admitting: *Deleted

## 2014-10-05 ENCOUNTER — Telehealth: Payer: Self-pay | Admitting: Oncology

## 2014-10-05 NOTE — Telephone Encounter (Signed)
s/w pt wife sylvia this morning - per sylvia she s/w dr Hazeline Junker nurse and was to receive a call re an appt for pt. wife informed i would check nurse and call her back [phone]). checked with desk nurse and pof was resent - 1st pof not sent. called wife and left message re appt for 2/12 @ 4pm. wife identified on vm and asked to call me back to confirm.

## 2014-10-06 ENCOUNTER — Telehealth: Payer: Self-pay | Admitting: Oncology

## 2014-10-06 ENCOUNTER — Encounter (HOSPITAL_COMMUNITY): Payer: Self-pay | Admitting: *Deleted

## 2014-10-06 NOTE — Telephone Encounter (Signed)
pt wife returned call and was given appt for 2/12 @ 4pm.

## 2014-10-11 ENCOUNTER — Encounter (INDEPENDENT_AMBULATORY_CARE_PROVIDER_SITE_OTHER): Payer: 59 | Admitting: Ophthalmology

## 2014-10-11 ENCOUNTER — Ambulatory Visit (INDEPENDENT_AMBULATORY_CARE_PROVIDER_SITE_OTHER): Payer: 59 | Admitting: *Deleted

## 2014-10-11 DIAGNOSIS — Z5181 Encounter for therapeutic drug level monitoring: Secondary | ICD-10-CM

## 2014-10-11 DIAGNOSIS — I2699 Other pulmonary embolism without acute cor pulmonale: Secondary | ICD-10-CM

## 2014-10-11 LAB — POCT INR: INR: 2

## 2014-10-11 MED ORDER — WARFARIN SODIUM 5 MG PO TABS: ORAL_TABLET | ORAL | Status: DC

## 2014-10-11 NOTE — Patient Instructions (Signed)

## 2014-10-18 ENCOUNTER — Ambulatory Visit (INDEPENDENT_AMBULATORY_CARE_PROVIDER_SITE_OTHER): Payer: 59 | Admitting: *Deleted

## 2014-10-18 DIAGNOSIS — I2699 Other pulmonary embolism without acute cor pulmonale: Secondary | ICD-10-CM

## 2014-10-18 DIAGNOSIS — Z5181 Encounter for therapeutic drug level monitoring: Secondary | ICD-10-CM

## 2014-10-18 LAB — POCT INR: INR: 4.7

## 2014-10-20 ENCOUNTER — Ambulatory Visit (HOSPITAL_COMMUNITY)
Admission: RE | Admit: 2014-10-20 | Discharge: 2014-10-20 | Disposition: A | Discharge status: Home / Self Care | Payer: 59 | Source: Ambulatory Visit | Attending: Oncology | Admitting: Oncology

## 2014-10-20 ENCOUNTER — Ambulatory Visit (HOSPITAL_BASED_OUTPATIENT_CLINIC_OR_DEPARTMENT_OTHER): Payer: 59 | Admitting: Oncology

## 2014-10-20 ENCOUNTER — Telehealth: Payer: Self-pay | Admitting: Oncology

## 2014-10-20 VITALS — BP 153/73 | HR 111 | Temp 98.0°F | Resp 20 | Ht 70.0 in | Wt 263.0 lb

## 2014-10-20 DIAGNOSIS — D729 Disorder of white blood cells, unspecified: Secondary | ICD-10-CM | POA: Insufficient documentation

## 2014-10-20 DIAGNOSIS — I10 Essential (primary) hypertension: Secondary | ICD-10-CM

## 2014-10-20 DIAGNOSIS — E119 Type 2 diabetes mellitus without complications: Secondary | ICD-10-CM

## 2014-10-20 DIAGNOSIS — D472 Monoclonal gammopathy: Secondary | ICD-10-CM

## 2014-10-20 DIAGNOSIS — D649 Anemia, unspecified: Secondary | ICD-10-CM

## 2014-10-20 DIAGNOSIS — N289 Disorder of kidney and ureter, unspecified: Secondary | ICD-10-CM

## 2014-10-20 DIAGNOSIS — I2699 Other pulmonary embolism without acute cor pulmonale: Secondary | ICD-10-CM

## 2014-10-20 NOTE — Progress Notes (Signed)
Hematology and Oncology Follow Up Visit  Isaiah [name] [id number] April 07, 1966 49 y.o. 10/20/2014 4:34 PM    Principle Diagnosis: This is a 49 year old gentleman with the following  diagnoses:  1. Monoclonal protein in the form of IgG kappa, likely represents monoclonal gammopathy, undetermined significance without enough  criteria to suggest multiple myeloma. He presented with M spike about 1 g/dL and bone marrow biopsy showed 7% plasma cell involvement without evidence of myeloma. He did not have any lytic bone lesions. 2. Microcytic anemia as a part of a hemoglobinopathy, most likely thalassemia.  Interim History:  Isaiah [name] presents today for a followup visit. Since the last visit Isaiah [name] have not been seen here for the last 3 years. He was hospitalized recently for shortness of breath and found to have pulmonary embolism. He was discharged on warfarin. He was also noted to have worsening renal insufficiency which is chronic in nature as well as worsening anemia. He had a repeat serum protein electrophoresis which showed an M spike of 1.98 g/dL. His previous M spike was 1.2 back in May 2013. Since his discharge, he is feeling a lot better. He is no longer reporting any chest pain or shortness of breath. Is not reporting any bleeding. He has resumed work-related duties and actually planning to take a job out of town in the near future.  He does not report any headaches, blurry vision, syncope or seizures. He does not report any chest pain, palpitation, orthopnea or leg edema. Does not report any cough or hemoptysis or hematemesis. He does not report any nausea, vomiting, abdominal pain. He does not report any frequency, urgency or hesitancy. He does not report any skeletal complaints of arthralgias myalgias or pathological fractures. He does not report any opportunistic infections or recurrent infections. Rest of his review of systems unremarkable.   Medications: I have reviewed the patient's  current medications.  Current outpatient prescriptions:  .  Albuterol Sulfate (PROAIR RESPICLICK IN), Inhale 2 puffs into the lungs as needed. Asthma attacks, Disp: , Rfl:  .  allopurinol (ZYLOPRIM) 300 MG tablet, Take 300 mg by mouth daily., Disp: , Rfl:  .  amLODipine (NORVASC) 10 MG tablet, Take 10 mg by mouth daily., Disp: , Rfl:  .  aspirin 81 MG chewable tablet, Chew 81 mg by mouth daily., Disp: , Rfl:  .  atorvastatin (LIPITOR) 10 MG tablet, Take 10 mg by mouth daily., Disp: , Rfl:  .  cetirizine (ZYRTEC) 10 MG tablet, Take 10 mg by mouth daily as needed. , Disp: , Rfl:  .  colchicine 0.6 MG tablet, Take 0.6 mg by mouth daily., Disp: , Rfl:  .  furosemide (LASIX) 20 MG tablet, Take 20 mg by mouth daily as needed. , Disp: , Rfl:  .  insulin aspart protamine- aspart (NOVOLOG MIX 70/30) (70-30) 100 UNIT/ML injection, Inject 10 Units into the skin daily. Sliding scale, Disp: , Rfl:  .  insulin glargine (LANTUS) 100 UNIT/ML injection, Inject 40 Units into the skin at bedtime., Disp: , Rfl:  .  metoprolol tartrate (LOPRESSOR) 25 MG tablet, Take 25 mg by mouth 2 (two) times daily., Disp: , Rfl:  .  mometasone (ASMANEX) 220 MCG/INH inhaler, Inhale into the lungs daily., Disp: , Rfl:  .  montelukast (SINGULAIR) 10 MG tablet, Take 10 mg by mouth daily., Disp: , Rfl:  .  Vitamin D, Ergocalciferol, (DRISDOL) 50000 UNITS CAPS capsule, Take 50,000 Units by mouth every 7 (seven) days. To take  Weekly for 12  weeks, Disp: , Rfl:  .  warfarin (COUMADIN) 5 MG tablet, Take as directed by coumadin clinic, Disp: 60 tablet, Rfl: 0  Allergies:  Allergies  Allergen Reactions  . Codeine Other (See Comments)    Pt unknown  . Codeine Rash and Other (See Comments)    Unknown reaction (patient says it was more serious than just a rash, but he can't remember what happened)    Past Medical History, Surgical history, Social history, and Family History were reviewed and updated.  Physical Exam: Blood pressure  153/73, pulse 111, temperature 98 F (36.7 C), temperature source Oral, resp. rate 20, height 5' 10"  (1.778 m), weight 263 lb (119.296 kg), SpO2 95 %. ECOG: 1 General appearance: alert, awake not in any distress. Head: Normocephalic, without obvious abnormality Neck: no adenopathy Lymph nodes: Cervical, supraclavicular, and axillary nodes normal. Heart:regular rate and rhythm, S1, S2 normal, no murmur, click, rub or gallop Lung:chest clear, no wheezing, rales, normal symmetric air entry Abdomin: soft, non-tender, without masses or organomegaly EXT:no erythema, induration, or nodules   Lab Results: Lab Results  Component Value Date   WBC 15.8* 09/30/2014   HGB 7.9* 09/30/2014   HCT 22.1* 09/30/2014   MCV 74.7* 09/30/2014   PLT 320 09/30/2014     Chemistry      Component Value Date/Time   NA 136 10/01/2014 0313   K 4.9 10/01/2014 0313   CL 114* 10/01/2014 0313   CO2 13* 10/01/2014 0313   BUN 44* 10/01/2014 0313   CREATININE 4.12* 10/01/2014 0313      Component Value Date/Time   CALCIUM 8.2* 10/01/2014 0313   ALKPHOS 113 01/16/2012 1117   AST 14 01/16/2012 1117   ALT 11 01/16/2012 1117   BILITOT 0.9 01/16/2012 1117       Results for Isaiah [name], Isaiah [name] (MRN [id number]) as of 10/20/2014 16:37  Ref. Range 01/16/2012 11:17 09/27/2014 19:26  Gamma Globulin Latest Range: 11.1-18.8 % 28.9 (H) 31.2 (H)  M-SPIKE, % Latest Units: g/dL 1.16 1.98     Impression and Plan: This is a 49 year old gentleman with the following issues:   1. Monoclonal gammopathy, IgG subtype likely monoclonal gammopathy of undetermined significance. His initial workup in 2012 did not show no clear-cut evidence to suggest end-organ damage. No lytic bony lesions. His bone marrow biopsy did not suggest the diagnosis of multiple myeloma, again, due to the really scattered nature of his plasma cells. However, he is at a high risk of developing multiple myeloma. He had failed to follow-up in the last 3 years and  his follow-up M spike is slightly elevated of 1.98 g/dL. Although it is unlikely that he have evolved into multiple myeloma it is very important to restage him. I will  obtain a skeletal survey and a bone marrow biopsy for staging purposes. If he continues to show monoclonal gammopathy of undetermined significance, no further management is needed and likely will need occasional follow-up.  2. Anemia, which is multifactorial and definitely has an element of hemoglobinopathy. Given his renal insufficiency he would probably benefit from Procrit or Aranesp. He will establish care with nephrology when [place] out of [place] and that has already been established. A bone marrow biopsy will rule out a plasma cell component of his anemia.  3. Renal insufficiency: Likely related to long-standing hypertension and diabetes and less likely a plasma cell disorder.  4. Pulmonary embolism: He has had recurrent thrombosis that is unprovoked. Could be related to a plasma cell disorder and he  is currently on full dose warfarin. I recommend continue on warfarin lifetime at this time given his high risk of recurrence.  All questions were answered today to his satisfaction and I will communicate the results of the bone marrow biopsy and skeletal survey as soon as they're available.     [name], MD 2/12/20164:34 PM

## 2014-10-20 NOTE — Telephone Encounter (Signed)
MAILED MEDICAL RECORDS TO DESIGNATED PERSON ON RELEASE FORM ON 10/20/14 DESIGNATED PERSON: SYLVIA Quirino

## 2014-10-24 ENCOUNTER — Telehealth: Payer: Self-pay | Admitting: Cardiovascular Disease

## 2014-10-24 NOTE — Telephone Encounter (Signed)
New message    Request for surgical clearance:  1. What type of surgery is being performed? Bone marrow biopsy   2. When is this surgery scheduled? 2.29.2016 @ 9:00 am   3. Are there any medications that need to be held prior to surgery and how long? Coumadin off 4 days   4. Name of physician performing surgery? Dr. Alen Blew   5. What is your office phone and fax number? [phone] / fax [phone].

## 2014-10-24 NOTE — Telephone Encounter (Signed)
Artist Pais at [name] radiology is aware that Dr. Acie Fredrickson okay for pt to hold Coumadin for 4 days. Pt needs to restart medication as soon as possible. Message of clearance  faxed to [phone].

## 2014-10-24 NOTE — Telephone Encounter (Signed)
Pt is on coumadin for pulmonary embolus.  Has a hx of sickle cell disease.    He may hold coumdin for 4 days prior to bone marrow bx.  Restart as soon as possible.

## 2014-10-24 NOTE — Telephone Encounter (Signed)
Isaiah [name] from [facility] health scheduling, called go have surgical clearance for pt. He is having  bone marrow biopsy on 11/06/14. Pt needs to be off coumadin for 4 days prior the procedure. Pt was seen in this office on 09/27/14 in [facility] clinic.

## 2014-10-25 ENCOUNTER — Telehealth: Payer: Self-pay | Admitting: *Deleted

## 2014-10-25 ENCOUNTER — Ambulatory Visit (INDEPENDENT_AMBULATORY_CARE_PROVIDER_SITE_OTHER): Payer: 59 | Admitting: *Deleted

## 2014-10-25 DIAGNOSIS — Z5181 Encounter for therapeutic drug level monitoring: Secondary | ICD-10-CM

## 2014-10-25 DIAGNOSIS — I2699 Other pulmonary embolism without acute cor pulmonale: Secondary | ICD-10-CM

## 2014-10-25 LAB — POCT INR: INR: 4.6

## 2014-10-25 NOTE — Patient Instructions (Signed)
Take your last dose of Coumadin on 11/01/14 for bone marrow biposy. Resume Coumadin per Dr. Please call with any questions [phone]

## 2014-10-25 NOTE — Telephone Encounter (Signed)
Received call to [place] from patient's wife, [name], requesting the results of the bone survery patient had done on 10/20/14.  Message routed to Dr. Alen Blew and desk RN. [name] requests that she is called back instead of patient because she is easier to get in touch with via phone. Her callback number is [phone].

## 2014-10-25 NOTE — Telephone Encounter (Signed)
Results discussed with [name] over the phone after I reviewed the images with radiology. Very small lesions noted and when compared to previous X rays in 2012 they are unchanged. No clear cut myeloma lesions.

## 2014-10-26 ENCOUNTER — Telehealth: Payer: Self-pay | Admitting: Nurse Practitioner

## 2014-10-26 ENCOUNTER — Other Ambulatory Visit: Payer: Self-pay | Admitting: *Deleted

## 2014-10-26 DIAGNOSIS — E875 Hyperkalemia: Secondary | ICD-10-CM

## 2014-10-26 NOTE — Progress Notes (Signed)
Received voice message from patient's wife, [name], stating,"I think Locke is dehydrated and needs IVF." I spoke with [name] and have arranged for him to Selena Lesser, NP, in [facility] tomorrow. POF sent to scheduling. [name] verbalized understanding.

## 2014-10-26 NOTE — Telephone Encounter (Signed)
, °

## 2014-10-27 ENCOUNTER — Ambulatory Visit (HOSPITAL_BASED_OUTPATIENT_CLINIC_OR_DEPARTMENT_OTHER): Payer: 59 | Admitting: Nurse Practitioner

## 2014-10-27 ENCOUNTER — Other Ambulatory Visit (HOSPITAL_BASED_OUTPATIENT_CLINIC_OR_DEPARTMENT_OTHER): Payer: 59

## 2014-10-27 ENCOUNTER — Ambulatory Visit (HOSPITAL_COMMUNITY)
Admission: RE | Admit: 2014-10-27 | Discharge: 2014-10-27 | Disposition: A | Discharge status: Home / Self Care | Payer: 59 | Source: Ambulatory Visit | Attending: Oncology | Admitting: Oncology

## 2014-10-27 ENCOUNTER — Telehealth: Payer: Self-pay | Admitting: Cardiovascular Disease

## 2014-10-27 ENCOUNTER — Ambulatory Visit: Payer: 59

## 2014-10-27 ENCOUNTER — Ambulatory Visit (HOSPITAL_COMMUNITY)
Admission: RE | Admit: 2014-10-27 | Discharge: 2014-10-27 | Disposition: A | Discharge status: Home / Self Care | Payer: 59 | Source: Ambulatory Visit | Attending: Nurse Practitioner | Admitting: Nurse Practitioner

## 2014-10-27 ENCOUNTER — Other Ambulatory Visit: Payer: Self-pay | Admitting: Oncology

## 2014-10-27 ENCOUNTER — Telehealth: Payer: Self-pay | Admitting: Pharmacist

## 2014-10-27 VITALS — BP 147/77 | HR 102 | Temp 98.0°F | Resp 19 | Wt 253.2 lb

## 2014-10-27 VITALS — BP 139/75 | HR 88 | Temp 97.6°F | Resp 18

## 2014-10-27 DIAGNOSIS — D508 Other iron deficiency anemias: Secondary | ICD-10-CM | POA: Diagnosis present

## 2014-10-27 DIAGNOSIS — Z7901 Long term (current) use of anticoagulants: Secondary | ICD-10-CM

## 2014-10-27 DIAGNOSIS — E875 Hyperkalemia: Secondary | ICD-10-CM

## 2014-10-27 DIAGNOSIS — N289 Disorder of kidney and ureter, unspecified: Secondary | ICD-10-CM

## 2014-10-27 DIAGNOSIS — D472 Monoclonal gammopathy: Secondary | ICD-10-CM

## 2014-10-27 DIAGNOSIS — D649 Anemia, unspecified: Secondary | ICD-10-CM

## 2014-10-27 DIAGNOSIS — Z9289 Personal history of other medical treatment: Secondary | ICD-10-CM

## 2014-10-27 HISTORY — DX: Personal history of other medical treatment: Z92.89

## 2014-10-27 LAB — COMPREHENSIVE METABOLIC PANEL (CC13)
ALT: 9 U/L (ref 0–55)
AST: 18 U/L (ref 5–34)
Albumin: 3.1 g/dL — ABNORMAL LOW (ref 3.5–5.0)
Alkaline Phosphatase: 113 U/L (ref 40–150)
Anion Gap: 10 mEq/L (ref 3–11)
BUN: 60.4 mg/dL — ABNORMAL HIGH (ref 7.0–26.0)
CO2: 18 mEq/L — ABNORMAL LOW (ref 22–29)
Calcium: 7.6 mg/dL — ABNORMAL LOW (ref 8.4–10.4)
Chloride: 104 mEq/L (ref 98–109)
Creatinine: 4.8 mg/dL (ref 0.7–1.3)
EGFR: 15 mL/min/{1.73_m2} — ABNORMAL LOW (ref >90)
Glucose: 229 mg/dl — ABNORMAL HIGH (ref 70–140)
Potassium: 3.4 mEq/L — ABNORMAL LOW (ref 3.5–5.1)
Sodium: 132 mEq/L — ABNORMAL LOW (ref 136–145)
Total Bilirubin: 0.99 mg/dL (ref 0.20–1.20)
Total Protein: 7.7 g/dL (ref 6.4–8.3)

## 2014-10-27 LAB — CBC WITH DIFFERENTIAL/PLATELET
BASO%: 0.3 % (ref 0.0–2.0)
Basophils Absolute: 0.1 10*3/uL (ref 0.0–0.1)
EOS%: 1 % (ref 0.0–7.0)
Eosinophils Absolute: 0.2 10*3/uL (ref 0.0–0.5)
HCT: 14.7 % — ABNORMAL LOW (ref 38.4–49.9)
HGB: 4.9 g/dL — CL (ref 13.0–17.1)
LYMPH%: 5.1 % — ABNORMAL LOW (ref 14.0–49.0)
MCH: 25.4 pg — ABNORMAL LOW (ref 27.2–33.4)
MCHC: 33.3 g/dL (ref 32.0–36.0)
MCV: 76.2 fL — ABNORMAL LOW (ref 79.3–98.0)
MONO#: 1.4 10*3/uL — ABNORMAL HIGH (ref 0.1–0.9)
MONO%: 7.6 % (ref 0.0–14.0)
NEUT#: 15.9 10*3/uL — ABNORMAL HIGH (ref 1.5–6.5)
NEUT%: 86 % — ABNORMAL HIGH (ref 39.0–75.0)
Platelets: 486 10*3/uL — ABNORMAL HIGH (ref 140–400)
RBC: 1.93 10*6/uL — ABNORMAL LOW (ref 4.20–5.82)
RDW: 17.4 % — ABNORMAL HIGH (ref 11.0–14.6)
WBC: 18.5 10*3/uL — ABNORMAL HIGH (ref 4.0–10.3)
lymph#: 1 10*3/uL (ref 0.9–3.3)
nRBC: 11 % — ABNORMAL HIGH (ref 0–0)

## 2014-10-27 LAB — ABO/RH: ABO/RH(D): A POS

## 2014-10-27 LAB — PREPARE RBC (CROSSMATCH)

## 2014-10-27 LAB — TECHNOLOGIST REVIEW

## 2014-10-27 MED ORDER — DIPHENHYDRAMINE HCL 25 MG PO CAPS
25.0000 mg | ORAL_CAPSULE | Freq: Once | ORAL | Status: AC
  Administered 2014-10-27: 25 mg via ORAL
  Filled 2014-10-27: qty 1

## 2014-10-27 MED ORDER — ACETAMINOPHEN 325 MG PO TABS
650.0000 mg | ORAL_TABLET | Freq: Once | ORAL | Status: AC
  Administered 2014-10-27: 650 mg via ORAL
  Filled 2014-10-27: qty 2

## 2014-10-27 MED ORDER — SODIUM CHLORIDE 0.9 % IV SOLN
250.0000 mL | Freq: Once | INTRAVENOUS | Status: AC
  Administered 2014-10-27: 250 mL via INTRAVENOUS

## 2014-10-27 NOTE — Telephone Encounter (Signed)
Spoke with spouse, she states pt was sent to [facility] but they didn't have any space at [facility] to give PRBCs. Instructed [place] to keep [place] informed on condition and if meds change or if bone marrow Bx is cancelled.

## 2014-10-27 NOTE — Procedures (Signed)
[facility]  Procedure Note  Dermott IRW:[id number] DOB: December 16, 1965 DOA: 10/27/2014   Dr. Alen Blew  Associated Diagnosis: Other iron deficiency anemia D50.8  Procedure Note: 2 units of PRBC transfused per order   Condition During Procedure: patient stable throughout procedure, denied any discomforts   Condition at Discharge: patient stable.  Wife present at discharge.   Roberto Scales, RN  [facility]

## 2014-10-27 NOTE — Telephone Encounter (Signed)
Received a call from the patient's wife.  He was originally referred to our office by the allergist for hypoxemia. When he arrived here we recognize that he was quite hypoxic, cachectic, and tachycardic. We referred him correctly to the emergency room. He was found have a pulmonary embolus as well as acute renal failure.  He was started on Coumadin by the hospitalist. His echocardiogram revealed normal left ventricle systolic function.  I received a phone call several days ago requesting to hold his Coumadin because of an upcoming bone marrow biopsy. My note I stated that he was on Coumadin for a pulmonary in was and not necessarily a cardiac problem.  We received another phone call tonight from the patient's wife asking whether or not he should take his Coumadin today. He apparently has developed severe anemia worsening of his hemoglobin. He was seen at one of the local cancer centers and received a transfusion. We see that his INR is 4.6.  I've instructed the wife several things.  1. His INR levels need To be managed by an oncologist. He does not have a cardiology diagnosis and I'm not comfortable   managing hypercoaguability  related to an oncology issue.  He is severely anemic and was just transfused 2 units PRBC.  We have not been managing any of his treatment.   2.  He needs to return to the hospital if he has any severe worsening shortness of breath.  3. I've instructed him to call the oncologist on Monday for further advice regarding his Coumadin. Until he hears differently , he is to hold his Coumadin until Monday for an INR of 4.6.

## 2014-10-27 NOTE — Progress Notes (Signed)
Events noted. Called the patient this evening and discussed the recent findings. He felt better after the transfusion and reports no GI bleeding. I advised him to hold his coumadin and report to the ED if he develops any sob, cp or change in the color of his stool. He is at high risk of developing bleeding given his his INR.

## 2014-10-27 NOTE — Telephone Encounter (Signed)
New message      Wife needs to talk to someone in the coumadin clinic----pt is getting a blood transfusion now

## 2014-10-29 LAB — TYPE AND SCREEN
ABO/RH(D): A POS
Antibody Screen: NEGATIVE
Unit division: 0
Unit division: 0

## 2014-10-30 ENCOUNTER — Ambulatory Visit (INDEPENDENT_AMBULATORY_CARE_PROVIDER_SITE_OTHER): Payer: Self-pay | Admitting: Pharmacist

## 2014-10-30 ENCOUNTER — Encounter: Payer: Self-pay | Admitting: Physician Assistant

## 2014-10-30 ENCOUNTER — Other Ambulatory Visit: Payer: Self-pay | Admitting: Oncology

## 2014-10-30 ENCOUNTER — Other Ambulatory Visit (HOSPITAL_BASED_OUTPATIENT_CLINIC_OR_DEPARTMENT_OTHER): Payer: 59

## 2014-10-30 ENCOUNTER — Ambulatory Visit (HOSPITAL_BASED_OUTPATIENT_CLINIC_OR_DEPARTMENT_OTHER): Payer: 59 | Admitting: Oncology

## 2014-10-30 ENCOUNTER — Ambulatory Visit: Payer: Self-pay | Admitting: Oncology

## 2014-10-30 ENCOUNTER — Other Ambulatory Visit: Payer: Self-pay

## 2014-10-30 ENCOUNTER — Telehealth: Payer: Self-pay | Admitting: Oncology

## 2014-10-30 VITALS — BP 146/80 | HR 93 | Temp 98.1°F | Resp 18 | Ht 70.0 in | Wt 255.4 lb

## 2014-10-30 DIAGNOSIS — I2699 Other pulmonary embolism without acute cor pulmonale: Secondary | ICD-10-CM

## 2014-10-30 DIAGNOSIS — D649 Anemia, unspecified: Secondary | ICD-10-CM

## 2014-10-30 DIAGNOSIS — N289 Disorder of kidney and ureter, unspecified: Secondary | ICD-10-CM

## 2014-10-30 DIAGNOSIS — D472 Monoclonal gammopathy: Secondary | ICD-10-CM

## 2014-10-30 DIAGNOSIS — D573 Sickle-cell trait: Secondary | ICD-10-CM

## 2014-10-30 DIAGNOSIS — D500 Iron deficiency anemia secondary to blood loss (chronic): Secondary | ICD-10-CM

## 2014-10-30 DIAGNOSIS — Z5181 Encounter for therapeutic drug level monitoring: Secondary | ICD-10-CM

## 2014-10-30 LAB — CBC WITH DIFFERENTIAL/PLATELET
BASO%: 0.5 % (ref 0.0–2.0)
Basophils Absolute: 0.1 10*3/uL (ref 0.0–0.1)
EOS%: 1 % (ref 0.0–7.0)
Eosinophils Absolute: 0.2 10*3/uL (ref 0.0–0.5)
HCT: 24.2 % — ABNORMAL LOW (ref 38.4–49.9)
HGB: 8.2 g/dL — ABNORMAL LOW (ref 13.0–17.1)
LYMPH%: 7.2 % — ABNORMAL LOW (ref 14.0–49.0)
MCH: 26.8 pg — ABNORMAL LOW (ref 27.2–33.4)
MCHC: 33.9 g/dL (ref 32.0–36.0)
MCV: 79.1 fL — ABNORMAL LOW (ref 79.3–98.0)
MONO#: 1.5 10*3/uL — ABNORMAL HIGH (ref 0.1–0.9)
MONO%: 7.1 % (ref 0.0–14.0)
NEUT#: 17.1 10*3/uL — ABNORMAL HIGH (ref 1.5–6.5)
NEUT%: 84.2 % — ABNORMAL HIGH (ref 39.0–75.0)
Platelets: 583 10*3/uL — ABNORMAL HIGH (ref 140–400)
RBC: 3.06 10*6/uL — ABNORMAL LOW (ref 4.20–5.82)
RDW: 18.8 % — ABNORMAL HIGH (ref 11.0–14.6)
WBC: 20.4 10*3/uL — ABNORMAL HIGH (ref 4.0–10.3)
lymph#: 1.5 10*3/uL (ref 0.9–3.3)
nRBC: 3 % — ABNORMAL HIGH (ref 0–0)

## 2014-10-30 LAB — PROTIME-INR
INR: 1.4 — ABNORMAL LOW (ref 2.00–3.50)
Protime: 16.8 Seconds — ABNORMAL HIGH (ref 10.6–13.4)

## 2014-10-30 LAB — HOLD TUBE, BLOOD BANK

## 2014-10-30 NOTE — Telephone Encounter (Signed)
Gave avs & calendar for March.

## 2014-10-30 NOTE — Progress Notes (Signed)
Hematology and Oncology Follow Up Visit  [place] [id number] 10-Jul-1966 49 y.o. 10/30/2014 3:35 PM    Principle Diagnosis: This is a 49 year old gentleman with the following diagnoses:   1. Monoclonal protein in the form of IgG kappa, likely represents monoclonal gammopathy, undetermined significance without enough  criteria to suggest multiple myeloma. He presented with M spike about 1 g/dL and bone marrow biopsy showed 7% plasma cell involvement without evidence of myeloma in 12/2010. He did not have any lytic bone lesions.  2. Multifactorial anemia: The etiology includes anemia of renal disease, plasma cell disorder, myeloproliferative disorder, and GI bleeding  Interim History:  Isaiah [name] presents today for a followup visit. Since the last visit, he noticed some decline his energy, fatigue and reported decline in his exercise tolerance. He had a CBC done on Friday, 10/27/2014 which showed a hemoglobin of 4.9, white cell count of 18.5 and a platelet count of 486. He also had an elevated INR of 4.7. He did not have signs or symptoms of acute GI bleed but did report change in the color of his stool recently to dark tarry color. He was instructed to stop Coumadin at that time and received 2 units of packed red cell transfusion on 10/27/2014. He felt much better in the last couple days and his energy is close to normal. He actually went to work today without any decline. His bowels are more regular at this time and his stool is brown in color. He does not report any nausea or vomiting. He does not report any dyspepsia. He did not report any chest pain or shortness of breath. He does not report any headaches, blurry vision, syncope or seizures. He does not report any  palpitation, orthopnea or leg edema. Does not report any cough or hemoptysis or hematemesis. He does not report any nausea, vomiting. He does not report any frequency, urgency or hesitancy. He does not report any skeletal complaints of  arthralgias myalgias or pathological fractures. He does not report any opportunistic infections or recurrent infections. Rest of his review of systems unremarkable.   Medications: I have reviewed the patient's current medications.  Current outpatient prescriptions:  .  Albuterol Sulfate (PROAIR RESPICLICK IN), Inhale 2 puffs into the lungs as needed. Asthma attacks, Disp: , Rfl:  .  allopurinol (ZYLOPRIM) 300 MG tablet, Take 300 mg by mouth daily., Disp: , Rfl:  .  amLODipine (NORVASC) 10 MG tablet, Take 10 mg by mouth daily., Disp: , Rfl:  .  aspirin 81 MG chewable tablet, Chew 81 mg by mouth daily., Disp: , Rfl:  .  atorvastatin (LIPITOR) 10 MG tablet, Take 10 mg by mouth daily., Disp: , Rfl:  .  cetirizine (ZYRTEC) 10 MG tablet, Take 10 mg by mouth daily as needed. , Disp: , Rfl:  .  colchicine 0.6 MG tablet, Take 0.6 mg by mouth daily., Disp: , Rfl:  .  furosemide (LASIX) 20 MG tablet, Take 20 mg by mouth daily as needed. , Disp: , Rfl:  .  insulin aspart protamine- aspart (NOVOLOG MIX 70/30) (70-30) 100 UNIT/ML injection, Inject 10 Units into the skin daily. Sliding scale, Disp: , Rfl:  .  insulin glargine (LANTUS) 100 UNIT/ML injection, Inject 40 Units into the skin at bedtime., Disp: , Rfl:  .  metoprolol tartrate (LOPRESSOR) 25 MG tablet, Take 25 mg by mouth 2 (two) times daily., Disp: , Rfl:  .  mometasone (ASMANEX) 220 MCG/INH inhaler, Inhale into the lungs daily., Disp: , Rfl:  .  montelukast (SINGULAIR) 10 MG tablet, Take 10 mg by mouth daily., Disp: , Rfl:  .  Vitamin D, Ergocalciferol, (DRISDOL) 50000 UNITS CAPS capsule, Take 50,000 Units by mouth every 7 (seven) days. To take  Weekly for 12 weeks, Disp: , Rfl:  .  warfarin (COUMADIN) 5 MG tablet, Take as directed by coumadin clinic (Patient not taking: Reported on 10/30/2014), Disp: 60 tablet, Rfl: 0  Allergies:  Allergies  Allergen Reactions  . Codeine Other (See Comments)    Pt unknown  . Codeine Rash and Other (See  Comments)    Unknown reaction (patient says it was more serious than just a rash, but he can't remember what happened)    Past Medical History, Surgical history, Social history, and Family History were reviewed and updated.  Physical Exam: Blood pressure 146/80, pulse 93, temperature 98.1 F (36.7 C), temperature source Oral, resp. rate 18, height 5' 10"  (1.778 m), weight 255 lb 6.4 oz (115.849 kg). ECOG: 1 General appearance: alert, awake not in any distress. Head: Normocephalic, without obvious abnormality Neck: no adenopathy Lymph nodes: Cervical, supraclavicular, and axillary nodes normal. Heart:regular rate and rhythm, S1, S2 normal, no murmur, click, rub or gallop Lung:chest clear, no wheezing, rales, normal symmetric air entry. No rhonchi or dullness to percussion. Abdomin: soft, non-tender, without masses or organomegaly. Good bowel sounds without any rebound or guarding. EXT:no erythema, induration, or nodules   Lab Results: Lab Results  Component Value Date   WBC 20.4* 10/30/2014   HGB 8.2* 10/30/2014   HCT 24.2* 10/30/2014   MCV 79.1* 10/30/2014   PLT 583* 10/30/2014     Chemistry      Component Value Date/Time   NA 132* 10/27/2014 0854   NA 136 10/01/2014 0313   K 3.4* 10/27/2014 0854   K 4.9 10/01/2014 0313   CL 114* 10/01/2014 0313   CO2 18* 10/27/2014 0854   CO2 13* 10/01/2014 0313   BUN 60.4* 10/27/2014 0854   BUN 44* 10/01/2014 0313   CREATININE 4.8* 10/27/2014 0854   CREATININE 4.12* 10/01/2014 0313      Component Value Date/Time   CALCIUM 7.6* 10/27/2014 0854   CALCIUM 8.2* 10/01/2014 0313   ALKPHOS 113 10/27/2014 0854   ALKPHOS 113 01/16/2012 1117   AST 18 10/27/2014 0854   AST 14 01/16/2012 1117   ALT 9 10/27/2014 0854   ALT 11 01/16/2012 1117   BILITOT 0.99 10/27/2014 0854   BILITOT 0.9 01/16/2012 1117       Peripheral smear from 10/27/2014 as well as 10/30/2014 were reviewed. Red cells appeared slightly microcytic with target cells  noted. He would cell fragments. Nucleated red cells were also noted. Some polychromasia was also noted.  FINDINGS: Lateral skull: No blastic or lytic bone lesions.  Cervical spine: No blastic or lytic bone lesions. No fracture or spondylolisthesis. Moderate osteoarthritic change.  Thoracic spine: There is degenerative change at multiple levels. No blastic or lytic bone lesions. No fracture or spondylolisthesis.  Chest: Lungs are clear. Heart size and pulmonary vascularity are normal. No adenopathy. No blastic or lytic bone lesions.  Lumbar spine: No fracture or spondylolisthesis. No blastic or lytic bone lesions.  Pelvis: There is moderate symmetric narrowing of both hip joints. No fracture or dislocation. There are several small lucent lesions in each femoral neck as well as in the right femoral head. There is lucency in the right superior pubic ramus.  Right femur: Small lucent areas in the right femoral neck are described in the pelvis report.  More distally, no blastic or lytic lesions are identified.  Left femur: Small lucent areas in the left femoral neck are described in the pelvis report. More distally, no blastic or lytic lesions are identified.  Right tibia and fibula: No blastic or lytic bone lesions.  Left tibia and fibula: No blastic or lytic bone lesions.  Right shoulder and humerus: There are several small lucent areas in the right humeral metaphysis and diaphysis regions.  Left shoulder and humerus: Several subtle lucencies are noted in the proximal left humerus.  Right forearm: No blastic or lytic bone lesions.  Left forearm: No blastic or lytic bone lesions.  IMPRESSION: There are several small lucent areas concerning for multiple myeloma in each proximal femur and humerus as well as in the right superior pubic ramus.  Impression and Plan: This is a 49 year old gentleman with the following issues:   1. Monoclonal gammopathy, IgG  subtype likely monoclonal gammopathy of undetermined significance. His initial workup in 2012 did not showno clear-cut evidence to suggest end-organ damage. His repeat skeletal survey on 10/20/2014 was reviewed personally with radiology and discussed today. Overall, the x-rays appeared within normal range except for very few questionable areas that appear to be chronic and less likely malignant. His bone marrow biopsy still pending and at this time I see no sign of active myeloma. If the bone marrow biopsy showed plasmacytosis of at least 64% and certainly we can consider this an active myeloma.  2. Anemia, which is multifactorial and definitely has an element of hemoglobinopathy. He had an acute drop down to 4.9 which is unclear to me if this was a sign of acute bleeding however his hemoglobin today is up to 8.2 after 2 units of packed red cell transfusions. He is asymptomatic at this time and does not require any further transfusions however he might benefit from growth factor support moving forward. We'll continue to monitor this issue closely.  3. Renal insufficiency: Likely related to long-standing hypertension and diabetes and less likely a plasma cell disorder. He follows with nephrology.  4. Pulmonary embolism: He has a documented DVT as well by an ultrasound Doppler done on 09/28/2014. The risk of thromboses is very high and rather significant and the risks of further clotting outweighs the potential complication of bleeding. I favor full dose anticoagulation at this time there was a clear-cut evidence of GI bleeding. I recommended continuing Lovenox until his bone marrow procedure is completed. His INR will be followed at the [facility] anticoagulation clinic.  5. Questionable GI bleed: It is unclear to me if he is indeed having active GI bleeding. His bowel movements have normalized but certainly worth investigating as soon as possible. I will make a referral to gastroenterology for  an endoscopy and possible colonoscopy.  6. Follow-up will be depending on the findings of his bone marrow biopsy as well as potential GI workup.    [facility], MD 2/22/20163:35 PM

## 2014-10-30 NOTE — Progress Notes (Addendum)
INR = 1.4  Goal 2-3 Patient previously managed by [place] [facility]. Patient has history of unprovoked bilateral PE in 2012. Admitted 09/27/14 with SOB on exertion.   VQ scan with intermediate probability of PE, dopplers positive for DVT L peroneal veins. Patient states he was started on Coumadin on 10/04/14. Coumadin has been on hold since last Thursday 10/27/14 when patient was seen with H/H = 8.2/24.2 and required PRBCs. He had dark stools at the time. Colonoscopy will be scheduled soon. He is scheduled for a bone marrow biopsy 2/29. He will begin Lovenox 180 mg daily through Sunday 11/05/14. Bone marrow biopsy is 11/06/14. He will begin Coumadin 7.5 mg daily (1 & 1/2 tablets) the evening of 2/29. He will resume Lovenox evening of 2/29 or 3/1 as instructed by MD. He will return to Coumadin clinic 11/09/14 for lab at 8am and Coumadin clinic at 8:15.   , PharmD    Lovenox dose with 120 mg, patient has renal insufficiency.  Lovenox 120 mg syringes supplied to patient. Also, H/H above is post transfusion, Hb prior to transfusion was 4.9. 

## 2014-10-31 ENCOUNTER — Encounter: Payer: Self-pay | Admitting: Pharmacist

## 2014-10-31 ENCOUNTER — Encounter: Payer: Self-pay | Admitting: Nurse Practitioner

## 2014-10-31 NOTE — Assessment & Plan Note (Signed)
Patient has been diagnosed previously with pulmonary embolisms in the colon and continues to take Coumadin on a chronic basis.  His Coumadin level is managed per the Coumadin clinic.  His most recent INR was elevated; and the Coumadin clinic has carefully instructed patient in regards to specific Coumadin dose changes.

## 2014-10-31 NOTE — Assessment & Plan Note (Signed)
Hemoglobin down to 4.9 today.  Patient is complaining of symptomatic anemia symptoms consisting of increased fatigue and dyspnea with any exertion whatsoever.  Patient will receive 2 units packed red blood cell transfusion in the sickle cell clinic today.  Will have patient return to the [facility] for repeat lab draw on Monday, 10/30/2014.  He may very well need additional transfusion at that time.

## 2014-10-31 NOTE — Assessment & Plan Note (Signed)
Patient suffers with chronic renal insufficiency most likely secondary to both hypertension and diabetes.  Creatinine has increased from 4.12 up to 4.8 today.  Most likely, this is secondary to some dehydration.  Hopefully, a blood transfusion will help improve the creatinine level. Will recheck labs next week.

## 2014-10-31 NOTE — Progress Notes (Signed)
SYMPTOM MANAGEMENT CLINIC   HPI: Isaiah [name] 49 y.o. male diagnosed with monoclonal gammopathy.  Currently under observation; and receiving supportive care on an as-needed basis.  Patient called the cancer Center today requesting urgent care visit.  Patient is complaining of progressive fatigue and worsening shortness of breath with any type of exertion whatsoever.  He also feels slightly dehydrated today.  Patient has a history of chronic renal insufficiency most likely secondary to both chronic hypertension and diabetes.  He also continues to take Coumadin on a regular basis for previously diagnosed pulmonary embolism.  He denies any GI or UTI symptoms whatsoever.  He denies any recent fevers or chills.   HPI  ROS  Past Medical History  Diagnosis Date  . Diabetes mellitus without complication   . Sickle cell-thalassemia disease     a. Sickle cell trait.  . Pulmonary embolism 07/2011    a. Tx with Coumadin for 6 months (unknown cause per patient).  . Hyperkalemia 07/2011  . Acute renal failure 07/2011  . Monoclonal gammopathy   . CKD (chronic kidney disease)   . Sleep apnea     a. uses CPAP.  [name] Hypertension   . Sickle cell-thalassemia disease   . Gout   . Pulmonary embolism 07/2011; 09/27/2014    a. Bilat PE 07/2011 - unclear cause, tx with 6 months Coumadin.;   . Monoclonal gammopathies   . Anemia   . Type II diabetes mellitus   . Asthma   . OSA on CPAP   . Sickle-cell thalassemia   . CKD (chronic kidney disease), stage IV     Past Surgical History  Procedure Laterality Date  . Cholecystectomy    . Bone marrow biopsy    . Other surgical history      Retinal surgery  . Pars plana vitrectomy  02/17/2012    Procedure: PARS PLANA VITRECTOMY WITH 25 GAUGE;  Surgeon: Hayden Pedro, MD;  Location: [facility];  Service: Ophthalmology;  Laterality: Right;  . Cholecystectomy  1990's?  . Eye surgery Right     has PE (pulmonary embolism); SOB (shortness of breath);  Hyperkalemia; ARF (acute renal failure); Leukocytosis, unspecified; DM (diabetes mellitus) type II controlled with renal manifestation; Cardiac enzymes elevated; Metabolic acidosis; HTN (hypertension); Sickle cell trait; Morbid obesity; Diabetic retinopathy of both eyes; Pulmonary embolus; CKD (chronic kidney disease), stage IV; MGUS (monoclonal gammopathy of unknown significance); OSA on CPAP; Encounter for therapeutic drug monitoring; Iron deficiency anemia due to chronic blood loss; Anemia; Long term current use of anticoagulant therapy; and Renal insufficiency on his problem list.    is allergic to codeine and codeine.    Medication List       This list is accurate as of: 10/27/14 11:59 PM.  Always use your most recent med list.               allopurinol 300 MG tablet  Commonly known as:  ZYLOPRIM  Take 300 mg by mouth daily.     amLODipine 10 MG tablet  Commonly known as:  NORVASC  Take 10 mg by mouth daily.     aspirin 81 MG chewable tablet  Chew 81 mg by mouth daily.     atorvastatin 10 MG tablet  Commonly known as:  LIPITOR  Take 10 mg by mouth daily.     cetirizine 10 MG tablet  Commonly known as:  ZYRTEC  Take 10 mg by mouth daily as needed.     colchicine 0.6 MG  tablet  Take 0.6 mg by mouth daily.     furosemide 20 MG tablet  Commonly known as:  LASIX  Take 20 mg by mouth daily as needed.     insulin aspart protamine- aspart (70-30) 100 UNIT/ML injection  Commonly known as:  NOVOLOG MIX 70/30  Inject 10 Units into the skin daily. Sliding scale     insulin glargine 100 UNIT/ML injection  Commonly known as:  LANTUS  Inject 40 Units into the skin at bedtime.     metoprolol tartrate 25 MG tablet  Commonly known as:  LOPRESSOR  Take 25 mg by mouth 2 (two) times daily.     mometasone 220 MCG/INH inhaler  Commonly known as:  ASMANEX  Inhale into the lungs daily.     montelukast 10 MG tablet  Commonly known as:  SINGULAIR  Take 10 mg by mouth daily.      PROAIR RESPICLICK IN  Inhale 2 puffs into the lungs as needed. Asthma attacks     Vitamin D (Ergocalciferol) 50000 UNITS Caps capsule  Commonly known as:  DRISDOL  Take 50,000 Units by mouth every 7 (seven) days. To take  Weekly for 12 weeks     warfarin 5 MG tablet  Commonly known as:  COUMADIN  Take as directed by coumadin clinic         PHYSICAL EXAMINATION  Blood pressure 147/77, pulse 102, temperature 98 F (36.7 C), temperature source Oral, resp. rate 19, weight 253 lb 3.2 oz (114.851 kg), SpO2 98 %.  Physical Exam  Constitutional: He is oriented to person, place, and time. Vital signs are normal. He appears malnourished and dehydrated. He appears unhealthy. He appears cachectic.  Patient appears fatigued, weak, and chronically ill.  HENT:  Head: Normocephalic and atraumatic.  Mouth/Throat: Oropharynx is clear and moist.  Eyes: Conjunctivae and EOM are normal. Pupils are equal, round, and reactive to light. Right eye exhibits no discharge. Left eye exhibits no discharge. No scleral icterus.  Neck: Normal range of motion. Neck supple. No JVD present. No tracheal deviation present. No thyromegaly present.  Cardiovascular: Normal rate, regular rhythm, normal heart sounds and intact distal pulses.   Pulmonary/Chest: Effort normal and breath sounds normal. No respiratory distress. He has no wheezes. He has no rales. He exhibits no tenderness.  Abdominal: Soft. Bowel sounds are normal. He exhibits no distension and no mass. There is no tenderness. There is no rebound and no guarding.  Musculoskeletal: Normal range of motion. He exhibits no edema or tenderness.  Lymphadenopathy:    He has no cervical adenopathy.  Neurological: He is alert and oriented to person, place, and time.  Skin: Skin is warm and dry. No rash noted. No erythema.  Psychiatric: Affect normal.  Nursing note and vitals reviewed.   LABORATORY DATA:. Appointment on 10/27/2014  Component Date Value Ref Range  Status  . WBC 10/27/2014 18.5* 4.0 - 10.3 10e3/uL Final  . NEUT# 10/27/2014 15.9* 1.5 - 6.5 10e3/uL Final  . HGB 10/27/2014 4.9* 13.0 - 17.1 g/dL Final  . HCT 10/27/2014 14.7* 38.4 - 49.9 % Final  . Platelets 10/27/2014 486* 140 - 400 10e3/uL Final  . MCV 10/27/2014 76.2* 79.3 - 98.0 fL Final  . MCH 10/27/2014 25.4* 27.2 - 33.4 pg Final  . MCHC 10/27/2014 33.3  32.0 - 36.0 g/dL Final  . RBC 10/27/2014 1.93* 4.20 - 5.82 10e6/uL Final  . RDW 10/27/2014 17.4* 11.0 - 14.6 % Final  . lymph# 10/27/2014 1.0  0.9 -  3.3 10e3/uL Final  . MONO# 10/27/2014 1.4* 0.1 - 0.9 10e3/uL Final  . Eosinophils Absolute 10/27/2014 0.2  0.0 - 0.5 10e3/uL Final  . Basophils Absolute 10/27/2014 0.1  0.0 - 0.1 10e3/uL Final  . NEUT% 10/27/2014 86.0* 39.0 - 75.0 % Final  . LYMPH% 10/27/2014 5.1* 14.0 - 49.0 % Final  . MONO% 10/27/2014 7.6  0.0 - 14.0 % Final  . EOS% 10/27/2014 1.0  0.0 - 7.0 % Final  . BASO% 10/27/2014 0.3  0.0 - 2.0 % Final  . nRBC 10/27/2014 11* 0 - 0 % Final  . Sodium 10/27/2014 132* 136 - 145 mEq/L Final  . Potassium 10/27/2014 3.4* 3.5 - 5.1 mEq/L Final  . Chloride 10/27/2014 104  98 - 109 mEq/L Final  . CO2 10/27/2014 18* 22 - 29 mEq/L Final  . Glucose 10/27/2014 229* 70 - 140 mg/dl Final  . BUN 10/27/2014 60.4* 7.0 - 26.0 mg/dL Final  . Creatinine 10/27/2014 4.8* 0.7 - 1.3 mg/dL Final  . Total Bilirubin 10/27/2014 0.99  0.20 - 1.20 mg/dL Final  . Alkaline Phosphatase 10/27/2014 113  40 - 150 U/L Final  . AST 10/27/2014 18  5 - 34 U/L Final  . ALT 10/27/2014 9  0 - 55 U/L Final  . Total Protein 10/27/2014 7.7  6.4 - 8.3 g/dL Final  . Albumin 10/27/2014 3.1* 3.5 - 5.0 g/dL Final  . Calcium 10/27/2014 7.6* 8.4 - 10.4 mg/dL Final  . Anion Gap 10/27/2014 10  3 - 11 mEq/L Final  . EGFR 10/27/2014 15* >90 ml/min/1.73 m2 Final   eGFR is calculated using the CKD-EPI Creatinine Equation (2009)  . Technologist Review 10/27/2014 few large plt. few teardrops & ovalo. mod targets. occ helmets.  nrbcs   Final  Hospital Outpatient Visit on 10/27/2014  Component Date Value Ref Range Status  . Order Confirmation 10/27/2014 ORDER PROCESSED BY BLOOD BANK   Final  . ABO/RH(D) 10/27/2014 A POS   Final  . Antibody Screen 10/27/2014 NEG   Final  . Sample Expiration 10/27/2014 10/30/2014   Final  . Unit Number 10/27/2014 [id number]   Final  . Blood Component Type 10/27/2014 RED CELLS,LR   Final  . Unit division 10/27/2014 00   Final  . Status of Unit 10/27/2014 ISSUED,FINAL   Final  . Transfusion Status 10/27/2014 OK TO TRANSFUSE   Final  . Crossmatch Result 10/27/2014 Compatible   Final  . Unit Number 10/27/2014 [id number]   Final  . Blood Component Type 10/27/2014 RED CELLS,LR   Final  . Unit division 10/27/2014 00   Final  . Status of Unit 10/27/2014 ISSUED,FINAL   Final  . Transfusion Status 10/27/2014 OK TO TRANSFUSE   Final  . Crossmatch Result 10/27/2014 Compatible   Final  . ABO/RH(D) 10/27/2014 A POS   Final  Anti-coag visit on 10/25/2014  Component Date Value Ref Range Status  . INR 10/25/2014 4.6   Final     RADIOGRAPHIC STUDIES: No results found.  ASSESSMENT/PLAN:    Anemia Hemoglobin down to 4.9 today.  Patient is complaining of symptomatic anemia symptoms consisting of increased fatigue and dyspnea with any exertion whatsoever.  Patient will receive 2 units packed red blood cell transfusion in the sickle cell clinic today.  Will have patient return to the [name] for repeat lab draw on Monday, 10/30/2014.  He may very well need additional transfusion at that time.   Long term current use of anticoagulant therapy Patient has been diagnosed previously with pulmonary  embolisms in the colon and continues to take Coumadin on a chronic basis.  His Coumadin level is managed per the Coumadin clinic.  His most recent INR was elevated; and the Coumadin clinic has carefully instructed patient in regards to specific Coumadin dose changes.   MGUS (monoclonal  gammopathy of unknown significance) Patient continues on observation only; as well as supportive care for his previously diagnosed monoclonal gammopathy.   Renal insufficiency Patient suffers with chronic renal insufficiency most likely secondary to both hypertension and diabetes.  Creatinine has increased from 4.12 up to 4.8 today.  Most likely, this is secondary to some dehydration.  Hopefully, a blood transfusion will help improve the creatinine level. Will recheck labs next week.   Patient stated understanding of all instructions; and was in agreement with this plan of care. The patient knows to call the clinic with any problems, questions or concerns.   Review/collaboration with Dr. Alen Blew regarding all aspects of patient's visit today.   Total time spent with patient was 25 minutes;  with greater than 75 percent of that time spent in face to face counseling regarding patient's symptoms,  and coordination of care and follow up.  Disclaimer: This note was dictated with voice recognition software. Similar sounding words can inadvertently be transcribed and may not be corrected upon review.   Drue Second, NP 10/31/2014

## 2014-10-31 NOTE — Assessment & Plan Note (Signed)
Patient continues on observation only; as well as supportive care for his previously diagnosed monoclonal gammopathy.

## 2014-10-31 NOTE — Progress Notes (Signed)
Spoke with Dr. Alen Blew.   Isaiah [name] is now scheduled for a colonoscopy next Wednesday, 11/08/14. Anticoagulation plans have been revised and are now as follows: Continue Lovenox 120 mg (decreased dose for renal insufficiency) through Saturday 2/27. Do not use Lovenox Sunday 2/28. Bone marrow biopsy Monday 2/29. Lovenox evening of 2/29. No Lovenox 3/1. Colonoscopy 3/2 am. Resume Lovenox and Coumadin 3/2 pm. Will telephone Isaiah [name] and give him the above plan.

## 2014-11-01 ENCOUNTER — Telehealth: Payer: Self-pay | Admitting: Pharmacist

## 2014-11-01 NOTE — Telephone Encounter (Signed)
The following instructions emailed to Mr. And Mrs. Steenbergen following my telephone conversation with Mr. Urbani this morning: (permission was received from Mr. Viverette to include his wife who is a Furniture conservator/restorer in this email).   Continue Lovenox 120 mg daily today through Saturday 2/27. Sunday 2/28 (day prior to bone marrow biopsy) No Lovenox 2/29 (evening of bone marrow biopsy) Lovenox 120 mg 3/1 (day before colonoscopy) No Lovenox 3/2 (day of colonoscopy) in the evening resume Lovenox 120 mg daily, begin Coumadin 10 mg daily. Continue Lovenox and Coumadin until your next Coumadin clinic appt. We would like to check your INR on Monday 11/13/14.  Our earliest appointment is 8am and our latest appointment is 3:45pm. I have tentatively scheduled you for 8am, but can change that to whatever time you need. If you have time to get your INR drawn, but not time to stay for clinic we can call you with results and instructions later that day.

## 2014-11-01 NOTE — Telephone Encounter (Signed)
I spoke with [name] regarding the instructions for anticoagulation around procedures included in my previous documentation from 10/31/14. With his permission, I am emailing these instructions to him and his wife, Isaiah [name]. I have tentatively scheduled a Coumadin appt for him on Monday 3//7/16 with lab at 8am and Coumadin clinic at 8am. He will notify us if he needs to change this appt.

## 2014-11-02 ENCOUNTER — Other Ambulatory Visit: Payer: Self-pay | Admitting: Radiology

## 2014-11-03 ENCOUNTER — Other Ambulatory Visit: Payer: Self-pay | Admitting: Radiology

## 2014-11-06 ENCOUNTER — Ambulatory Visit (HOSPITAL_COMMUNITY)
Admission: RE | Admit: 2014-11-06 | Discharge: 2014-11-06 | Disposition: A | Discharge status: Home / Self Care | Payer: 59 | Source: Ambulatory Visit | Attending: Oncology | Admitting: Oncology

## 2014-11-06 ENCOUNTER — Encounter: Payer: Self-pay | Admitting: Oncology

## 2014-11-06 ENCOUNTER — Encounter: Payer: Self-pay | Admitting: Pharmacist

## 2014-11-06 ENCOUNTER — Encounter (HOSPITAL_COMMUNITY): Payer: Self-pay

## 2014-11-06 DIAGNOSIS — D729 Disorder of white blood cells, unspecified: Secondary | ICD-10-CM | POA: Insufficient documentation

## 2014-11-06 HISTORY — DX: Personal history of other medical treatment: Z92.89

## 2014-11-06 LAB — PROTIME-INR
INR: 1.04 (ref 0.00–1.49)
Prothrombin Time: 13.7 seconds (ref 11.6–15.2)

## 2014-11-06 LAB — CBC
HCT: 25.3 % — ABNORMAL LOW (ref 39.0–52.0)
Hemoglobin: 8.4 g/dL — ABNORMAL LOW (ref 13.0–17.0)
MCH: 26.5 pg (ref 26.0–34.0)
MCHC: 33.2 g/dL (ref 30.0–36.0)
MCV: 79.8 fL (ref 78.0–100.0)
Platelets: 540 10*3/uL — ABNORMAL HIGH (ref 150–400)
RBC: 3.17 MIL/uL — ABNORMAL LOW (ref 4.22–5.81)
RDW: 18.5 % — ABNORMAL HIGH (ref 11.5–15.5)
WBC: 12.3 10*3/uL — ABNORMAL HIGH (ref 4.0–10.5)

## 2014-11-06 LAB — GLUCOSE, CAPILLARY: Glucose-Capillary: 111 mg/dL — ABNORMAL HIGH (ref 70–99)

## 2014-11-06 LAB — BONE MARROW EXAM

## 2014-11-06 MED ORDER — SODIUM CHLORIDE 0.9 % IV SOLN: INTRAVENOUS | Status: DC

## 2014-11-06 MED ORDER — MIDAZOLAM HCL 2 MG/2ML IJ SOLN
INTRAMUSCULAR | Status: AC
  Filled 2014-11-06: qty 6

## 2014-11-06 MED ORDER — FENTANYL CITRATE 0.05 MG/ML IJ SOLN
INTRAMUSCULAR | Status: AC
  Filled 2014-11-06: qty 4

## 2014-11-06 MED ORDER — MIDAZOLAM HCL 2 MG/2ML IJ SOLN
INTRAMUSCULAR | Status: AC | PRN
  Administered 2014-11-06: 1 mg via INTRAVENOUS
  Administered 2014-11-06: 0.5 mg via INTRAVENOUS

## 2014-11-06 MED ORDER — ACETAMINOPHEN 500 MG PO TABS
500.0000 mg | ORAL_TABLET | ORAL | Status: DC | PRN
  Filled 2014-11-06: qty 1

## 2014-11-06 MED ORDER — FENTANYL CITRATE 0.05 MG/ML IJ SOLN
INTRAMUSCULAR | Status: AC | PRN
  Administered 2014-11-06: 25 ug via INTRAVENOUS
  Administered 2014-11-06: 50 ug via INTRAVENOUS

## 2014-11-06 NOTE — Procedures (Signed)
CT guided bone marrow biopsy.  2 aspirates and 3 cores performed.  No immediate complication.

## 2014-11-06 NOTE — Discharge Instructions (Signed)
Bone Marrow Aspiration and Bone Biopsy Examination of the bone marrow is a valuable test to diagnose blood disorders. A bone marrow biopsy takes a sample of bone and a small amount of fluid and cells from inside the bone. A bone marrow aspiration removes only the marrow. Bone marrow aspiration and bone biopsies are used to stage different disorders of the blood, such as leukemia. Staging will help your caregiver understand how far the disease has progressed.  The tests are also useful in diagnosing:  Fever of unknown origin (FUO).  Bacterial infections and other widespread fungal infections.  Cancers that have spread (metastasized) to the bone marrow.  Diseases that are characterized by a deficiency of an enzyme (storage diseases). This includes:  Niemann-Pick disease.  Gaucher disease. PROCEDURE  Sites used to get samples include:   Back of your hip bone (posterior iliac crest).  Both aspiration and biopsy.  Front of your hip bone (anterior iliac crest).  Both aspiration and biopsy.  Breastbone (sternum).  Aspiration from your breastbone (done only in adults). This method is rarely used. When you get a hip bone aspiration:  You are placed lying on your side with the upper knee brought up and flexed with the lower leg straight.  The site is prepared, cleaned with an antiseptic scrub, and draped. This keeps the biopsy area clean.  The skin and the area down to the lining of the bone (periosteum) are made numb with a local anesthetic.  The bone marrow aspiration needle is inserted. You will feel pressure on your bone.  Once inside the marrow cavity, a sample of bone marrow is sucked out (aspirated) for pathology slides.  The material collected for bone marrow slides is processed immediately by a technologist.  The technician selects the marrow particles to make the slides for pathology.  The marrow aspiration needle is removed. Then pressure is applied to the site with  gauze until bleeding has stopped. Following an aspiration, a bone marrow biopsy may be performed as well. The technique for this is very similar. A dressing is then applied.  RISKS AND COMPLICATIONS  The main complications of a bone marrow aspiration and biopsy include infection and bleeding.  Complications are uncommon. The procedure may not be performed in patients with bleeding tendencies.  A very rare complication from the procedure is injury to the heart during a breastbone (sternal) marrow aspiration. Only bone marrow aspirations are performed in this area.  Long-lasting pain at the site of the bone marrow aspiration and biopsy is uncommon. Your caregiver will let you know when you are to get your results and will discuss them with you. You may make an appointment with your caregiver to find out the results. Do not assume everything is normal if you have not heard from your caregiver or the medical facility. It is important for you to follow up on all of your test results. Document Released: 08/28/2004 Document Revised: 11/17/2011 Document Reviewed: 08/22/2008 [facility] Patient Information 2015 [place], [place]. This information is not intended to replace advice given to you by your health care provider. Make sure you discuss any questions you have with your health care provider. Conscious Sedation Sedation is the use of medicines to promote relaxation and relieve discomfort and anxiety. Conscious sedation is a type of sedation. Under conscious sedation you are less alert than normal but are still able to respond to instructions or stimulation. Conscious sedation is used during short medical and dental procedures. It is milder than deep sedation  or general anesthesia and allows you to return to your regular activities sooner.  LET [facility] CARE PROVIDER KNOW ABOUT:   Any allergies you have.  All medicines you are taking, including vitamins, herbs, eye drops, creams, and over-the-counter  medicines.  Use of steroids (by mouth or creams).  Previous problems you or members of your family have had with the use of anesthetics.  Any blood disorders you have.  Previous surgeries you have had.  Medical conditions you have.  Possibility of pregnancy, if this applies.  Use of cigarettes, alcohol, or illegal drugs. RISKS AND COMPLICATIONS Generally, this is a safe procedure. However, as with any procedure, problems can occur. Possible problems include:  Oversedation.  Trouble breathing on your own. You may need to have a breathing tube until you are awake and breathing on your own.  Allergic reaction to any of the medicines used for the procedure. BEFORE THE PROCEDURE  You may have blood tests done. These tests can help show how well your kidneys and liver are working. They can also show how well your blood clots.  A physical exam will be done.  Only take medicines as directed by your health care provider. You may need to stop taking medicines (such as blood thinners, aspirin, or nonsteroidal anti-inflammatory drugs) before the procedure.   Do not eat or drink at least 6 hours before the procedure or as directed by your health care provider.  Arrange for a responsible adult, family member, or friend to take you home after the procedure. He or she should stay with you for at least 24 hours after the procedure, until the medicine has worn off. PROCEDURE   An intravenous (IV) catheter will be inserted into one of your veins. Medicine will be able to flow directly into your body through this catheter. You may be given medicine through this tube to help prevent pain and help you relax.  The medical or dental procedure will be done. AFTER THE PROCEDURE  You will stay in a recovery area until the medicine has worn off. Your blood pressure and pulse will be checked.   Depending on the procedure you had, you may be allowed to go home when you can tolerate liquids and your  pain is under control. Document Released: 05/20/2001 Document Revised: 08/30/2013 Document Reviewed: 05/02/2013 [facility] Patient Information 2015 [place], [place]. This information is not intended to replace advice given to you by your health care provider. Make sure you discuss any questions you have with your health care provider. Bone Marrow Aspiration, Bone Marrow Biopsy Care After Read the instructions outlined below and refer to this sheet in the next few weeks. These discharge instructions provide you with general information on caring for yourself after you leave the hospital. Your caregiver may also give you specific instructions. While your treatment has been planned according to the most current medical practices available, unavoidable complications occasionally occur. If you have any problems or questions after discharge, call your caregiver. FINDING OUT THE RESULTS OF YOUR TEST Not all test results are available during your visit. If your test results are not back during the visit, make an appointment with your caregiver to find out the results. Do not assume everything is normal if you have not heard from your caregiver or the medical facility. It is important for you to follow up on all of your test results.  HOME CARE INSTRUCTIONS  You have had sedation and may be sleepy or dizzy. Your thinking may not  be as clear as usual. For the next 24 hours:  Only take over-the-counter or prescription medicines for pain, discomfort, and or fever as directed by your caregiver.  Do not drink alcohol.  Do not smoke.  Do not drive.  Do not make important legal decisions.  Do not operate heavy machinery.  Do not care for small children by yourself.  Keep your dressing clean and dry. You may replace dressing with a bandage after 24 hours.  You may take a bath or shower after 24 hours.  Use an ice pack for 20 minutes every 2 hours while awake for pain as needed. SEEK MEDICAL CARE IF:    There is redness, swelling, or increasing pain at the biopsy site.  There is pus coming from the biopsy site.  There is drainage from a biopsy site lasting longer than one day.  An unexplained oral temperature above 102 F (38.9 C) develops. SEEK IMMEDIATE MEDICAL CARE IF:   You develop a rash.  You have difficulty breathing.  You develop any reaction or side effects to medications given. Document Released: 03/14/2005 Document Revised: 11/17/2011 Document Reviewed: 08/22/2008 [facility] Patient Information 2015 [place], [place]. This information is not intended to replace advice given to you by your health care provider. Make sure you discuss any questions you have with your health care provider. Conscious Sedation, Adult, Care After Refer to this sheet in the next few weeks. These instructions provide you with information on caring for yourself after your procedure. Your health care provider may also give you more specific instructions. Your treatment has been planned according to current medical practices, but problems sometimes occur. Call your health care provider if you have any problems or questions after your procedure. WHAT TO EXPECT AFTER THE PROCEDURE  After your procedure:  You may feel sleepy, clumsy, and have poor balance for several hours.  Vomiting may occur if you eat too soon after the procedure. HOME CARE INSTRUCTIONS  Do not participate in any activities where you could become injured for at least 24 hours. Do not:  Drive.  Swim.  Ride a bicycle.  Operate heavy machinery.  Cook.  Use power tools.  Climb ladders.  Work from a high place.  Do not make important decisions or sign legal documents until you are improved.  If you vomit, drink water, juice, or soup when you can drink without vomiting. Make sure you have little or no nausea before eating solid foods.  Only take over-the-counter or prescription medicines for pain, discomfort, or fever as  directed by your health care provider.  Make sure you and your family fully understand everything about the medicines given to you, including what side effects may occur.  You should not drink alcohol, take sleeping pills, or take medicines that cause drowsiness for at least 24 hours.  If you smoke, do not smoke without supervision.  If you are feeling better, you may resume normal activities 24 hours after you were sedated.  Keep all appointments with your health care provider. SEEK MEDICAL CARE IF:  Your skin is pale or bluish in color.  You continue to feel nauseous or vomit.  Your pain is getting worse and is not helped by medicine.  You have bleeding or swelling.  You are still sleepy or feeling clumsy after 24 hours. SEEK IMMEDIATE MEDICAL CARE IF:  You develop a rash.  You have difficulty breathing.  You develop any type of allergic problem.  You have a fever. MAKE SURE YOU:  Understand these instructions.  Will watch your condition.  Will get help right away if you are not doing well or get worse. Document Released: 06/15/2013 Document Reviewed: 06/15/2013 [facility] Patient Information 2015 [place], [place]. This information is not intended to replace advice given to you by your health care provider. Make sure you discuss any questions you have with your health care provider.

## 2014-11-06 NOTE — Progress Notes (Signed)
Faxed forms to [employer] on Friday 11/03/14 for wife Shela Leff

## 2014-11-06 NOTE — Progress Notes (Signed)
Received call from pts wife, [facility]. Pt had bone marrow bx this morning at 9 am. He will NOT be having a colonoscopy.  They wanted to know what to do w/ restarting his anticoagulation at this point. I s/w Isaiah [name] over phone (also emailed instructions to [email or web address]): Restart Lovenox 120 mg SQ Q24 hr today along w/ Coumadin 10 mg/day as documented in plans from 11/01/14 [name], Pharm.D's note) Pt will be leaving for [place], [place] on 11/08/14 - moving for job opportunity. He has appt w/ nephrologist on 11/15/14 in [place].  That MD has been notified that pt will need to be established w/ a Coumadin clinic ASAP. Pt will go to lab walk-in on 11/10/14.  Isaiah [name] is going to email me the name of the lab they choose and we'll need to fax a RX for the PT/INR to be drawn.   Once we get results, we can call pt w/ dosing instructions. Isaiah [name], Pharm.D., CPP 11/06/2014@3 :02 PM

## 2014-11-06 NOTE — H&P (Signed)
Chief Complaint: "I am here for a bone marrow biopsy."  Referring Physician(s): [name],Firas N  History of Present Illness: Isaiah [name] is a 49 y.o. male with plasma cell disorder, MGUS and multifactorial anemia. The patient has been seen by Dr. Alen Blew in the office 10/30/14 and scheduled today for image guided bone marrow biopsy. He denies any chest pain, shortness of breath or palpitations. He denies any active signs of bleeding or excessive bruising. He denies any recent fever or chills. The patient denies any history of sleep apnea or chronic oxygen use. He has previously tolerated sedation without complications.    Past Medical History  Diagnosis Date  . Diabetes mellitus without complication   . Sickle cell-thalassemia disease     a. Sickle cell trait.  . Pulmonary embolism 07/2011    a. Tx with Coumadin for 6 months (unknown cause per patient).  . Hyperkalemia 07/2011  . Acute renal failure 07/2011  . Monoclonal gammopathy   . CKD (chronic kidney disease)   . Sleep apnea     a. uses CPAP.  [name] Hypertension   . Sickle cell-thalassemia disease   . Gout   . Pulmonary embolism 07/2011; 09/27/2014    a. Bilat PE 07/2011 - unclear cause, tx with 6 months Coumadin.;   . Monoclonal gammopathies   . Anemia   . Type II diabetes mellitus   . Asthma   . OSA on CPAP   . Sickle-cell thalassemia   . CKD (chronic kidney disease), stage IV   . History of recent blood transfusion 10/27/14    2 Units PRBC's    Past Surgical History  Procedure Laterality Date  . Cholecystectomy    . Bone marrow biopsy    . Other surgical history      Retinal surgery  . Pars plana vitrectomy  02/17/2012    Procedure: PARS PLANA VITRECTOMY WITH 25 GAUGE;  Surgeon: Hayden Pedro, MD;  Location: [place];  Service: Ophthalmology;  Laterality: Right;  . Cholecystectomy  1990's?  . Eye surgery Right     Allergies: Codeine and Codeine  Medications: Prior to Admission medications   Medication  Sig Start Date End Date Taking? Authorizing Provider  allopurinol (ZYLOPRIM) 300 MG tablet Take 300 mg by mouth daily.   Yes Historical Provider, MD  amLODipine (NORVASC) 10 MG tablet Take 10 mg by mouth at bedtime.    Yes Historical Provider, MD  aspirin 81 MG chewable tablet Chew 81 mg by mouth daily.   Yes Historical Provider, MD  atorvastatin (LIPITOR) 10 MG tablet Take 10 mg by mouth daily.   Yes Historical Provider, MD  cetirizine (ZYRTEC) 10 MG tablet Take 10 mg by mouth daily.    Yes Historical Provider, MD  colchicine 0.6 MG tablet Take 0.6 mg by mouth daily.   Yes Historical Provider, MD  enoxaparin (LOVENOX) 120 MG/0.8ML injection Inject 120 mg into the skin daily.   Yes Historical Provider, MD  furosemide (LASIX) 20 MG tablet Take 20 mg by mouth daily as needed for fluid.  10/04/12  Yes Nathan R. Pickering, MD  insulin glargine (LANTUS) 100 UNIT/ML injection Inject 40 Units into the skin at bedtime.   Yes Historical Provider, MD  insulin lispro (HUMALOG) 100 UNIT/ML injection Inject 10 Units into the skin 3 (three) times daily before meals.   Yes Historical Provider, MD  montelukast (SINGULAIR) 10 MG tablet Take 10 mg by mouth at bedtime.   Yes Historical Provider, MD  Vitamin  D, Ergocalciferol, (DRISDOL) 50000 UNITS CAPS capsule Take 50,000 Units by mouth every 7 (seven) days. To take  Weekly for 12 weeks   Yes Historical Provider, MD  warfarin (COUMADIN) 5 MG tablet Take as directed by coumadin clinic 10/11/14   Thayer Headings, MD     Family History  Problem Relation Age of Onset  . Hypertension Mother   . Diabetes Mother   . Sickle cell anemia Brother   . Diabetes type I Brother   . Kidney disease Brother     History   Social History  . Marital Status: Married    Spouse Name: sylvia  . Number of Children: 1  . Years of Education: college   Occupational History  . Mantoloking History Main Topics  . Smoking status: Never Smoker   . Smokeless  tobacco: Never Used  . Alcohol Use: No  . Drug Use: No  . Sexual Activity: Yes   Other Topics Concern  . None   Social History Narrative   ** Merged History Encounter **       Review of Systems: A 12 point ROS discussed and pertinent positives are indicated in the HPI above.  All other systems are negative.  Review of Systems  Vital Signs: BP 155/91 mmHg  Pulse 88  Temp(Src) 98.5 F (36.9 C) (Oral)  Resp 16  SpO2 97%  Physical Exam  Constitutional: He is oriented to person, place, and time. No distress.  HENT:  Head: Normocephalic and atraumatic.  Neck: No tracheal deviation present.  Cardiovascular: Normal rate and regular rhythm.  Exam reveals no gallop and no friction rub.   No murmur heard. Pulmonary/Chest: Effort normal and breath sounds normal. No respiratory distress. He has no wheezes. He has no rales.  Abdominal: Soft. Bowel sounds are normal. He exhibits no distension. There is no tenderness.  Neurological: He is alert and oriented to person, place, and time.  Skin: He is not diaphoretic.    Mallampati Score:  MD Evaluation Airway: WNL Heart: WNL Abdomen: WNL Chest/ Lungs: WNL ASA  Classification: 3 Mallampati/Airway Score: Two  Imaging: Dg Bone Survey Met  10/20/2014   CLINICAL DATA:  Plasma cell disorder  EXAM: METASTATIC BONE SURVEY  COMPARISON:  None.  FINDINGS: Lateral skull:  No blastic or lytic bone lesions.  Cervical spine: No blastic or lytic bone lesions. No fracture or spondylolisthesis. Moderate osteoarthritic change.  Thoracic spine: There is degenerative change at multiple levels. No blastic or lytic bone lesions. No fracture or spondylolisthesis.  Chest: Lungs are clear. Heart size and pulmonary vascularity are normal. No adenopathy. No blastic or lytic bone lesions.  Lumbar spine: No fracture or spondylolisthesis. No blastic or lytic bone lesions.  Pelvis: There is moderate symmetric narrowing of both hip joints. No fracture or dislocation.  There are several small lucent lesions in each femoral neck as well as in the right femoral head. There is lucency in the right superior pubic ramus.  Right femur: Small lucent areas in the right femoral neck are described in the pelvis report. More distally, no blastic or lytic lesions are identified.  Left femur: Small lucent areas in the left femoral neck are described in the pelvis report. More distally, no blastic or lytic lesions are identified.  Right tibia and fibula: No blastic or lytic bone lesions.  Left tibia and fibula:  No blastic or lytic bone lesions.  Right shoulder and humerus: There are several small lucent areas in  the right humeral metaphysis and diaphysis regions.  Left shoulder and humerus: Several subtle lucencies are noted in the proximal left humerus.  Right forearm:  No blastic or lytic bone lesions.  Left forearm:  No blastic or lytic bone lesions.  IMPRESSION: There are several small lucent areas concerning for multiple myeloma in each proximal femur and humerus as well as in the right superior pubic ramus.   Electronically Signed   By: Lowella Grip III M.D.   On: 10/20/2014 21:40    Labs:  CBC:  Recent Labs  09/30/14 0556 10/27/14 0855 10/30/14 1421 11/06/14 0705  WBC 15.8* 18.5* 20.4* 12.3*  HGB 7.9* 4.9* 8.2* 8.4*  HCT 22.1* 14.7* 24.2* 25.3*  PLT 320 486* 583* 540*    COAGS:  Recent Labs  10/18/14 1640 10/25/14 1640 10/30/14 1421 11/06/14 0705  INR 4.7 4.6 1.40* 1.04    BMP:  Recent Labs  09/28/14 0453 09/29/14 0454 09/30/14 0556 10/01/14 0313 10/27/14 0854  NA 139 140 139 136 132*  K 5.1 5.1 4.7 4.9 3.4*  CL 117* 118* 117* 114*  --   CO2 15* 16* 17* 13* 18*  GLUCOSE 97 65* 66* 90 229*  BUN 55* 51* 46* 44* 60.4*  CALCIUM 7.8* 7.8* 8.1* 8.2* 7.6*  CREATININE 4.55* 4.38* 4.26* 4.12* 4.8*  GFRNONAA 14* 14* 15* 16*  --   GFRAA 16* 17* 17* 18*  --     LIVER FUNCTION TESTS:  Recent Labs  10/27/14 0854  BILITOT 0.99  AST 18  ALT  9  ALKPHOS 113  PROT 7.7  ALBUMIN 3.1*   Assessment and Plan: Plasma cell disorder MGUS Anemia, multifactorial  Seen by Dr. Alen Blew in the office 10/30/14 Scheduled today for image guided bone marrow biopsy with moderate sedation Patient has been NPO, labs reviewed History of PE was on coumadin and bridged with Lovenox, last dose of Lovenox Saturday.  Risks and Benefits discussed with the patient including, but not limited to bleeding, low yield sample and infection. All of the patient's questions were answered, patient is agreeable to proceed. Consent signed and in chart.   Thank you for this interesting consult.  I greatly enjoyed meeting [place] and look forward to participating in their care.  SignedHedy Jacob 11/06/2014, 8:18 AM   I spent a total of 20 Minutes in face to face in clinical consultation, greater than 50% of which was counseling/coordinating care for plasma cell disorder.

## 2014-11-07 ENCOUNTER — Other Ambulatory Visit: Payer: Self-pay | Admitting: *Deleted

## 2014-11-07 DIAGNOSIS — I2699 Other pulmonary embolism without acute cor pulmonale: Secondary | ICD-10-CM

## 2014-11-07 MED ORDER — WARFARIN SODIUM 5 MG PO TABS: ORAL_TABLET | ORAL | Status: DC

## 2014-11-08 ENCOUNTER — Other Ambulatory Visit: Payer: Self-pay | Admitting: Oncology

## 2014-11-08 ENCOUNTER — Ambulatory Visit: Payer: Self-pay | Admitting: Physician Assistant

## 2014-11-08 ENCOUNTER — Encounter: Payer: Self-pay | Admitting: Pharmacist

## 2014-11-08 NOTE — Progress Notes (Signed)
Email correspondence w/ pts wife, Abdurahman Rugg will go to lab in [place], [place] at [facility] Address: [address] [place] [address] Phone# [phone]; Fax# [phone]  I sent faxed orders for standing PT/INR orders PRN x 1 year. [name] aware.  Pt should be going there this Fri 3/4 for lab draw.  We'll call him once we have results. Kennith Center, Pharm.D., CPP 11/08/2014@3 :26 PM

## 2014-11-08 NOTE — Progress Notes (Signed)
Bone marrow results discussed with the patient and his wife over the phone. Plasma cell infiltration is about 13% which is not dramatically different from the previous bone marrow. This indicates either MGUS or smoldering myeloma. I do not see any evidence of clear-cut symptomatic multiple myeloma that requires treatment. I do not think his anemia of renal insufficiency as a consequence of a plasma cell disorder. I recommended observation and surveillance and repeat protein studies in about 6 months. He is planning relocation temporarily to [place] I will be back for his medical care in this area periodically.

## 2014-11-10 ENCOUNTER — Ambulatory Visit (INDEPENDENT_AMBULATORY_CARE_PROVIDER_SITE_OTHER): Payer: Self-pay | Admitting: Pharmacist

## 2014-11-10 DIAGNOSIS — D500 Iron deficiency anemia secondary to blood loss (chronic): Secondary | ICD-10-CM

## 2014-11-10 DIAGNOSIS — Z5181 Encounter for therapeutic drug level monitoring: Secondary | ICD-10-CM

## 2014-11-10 DIAGNOSIS — I2699 Other pulmonary embolism without acute cor pulmonale: Secondary | ICD-10-CM

## 2014-11-10 LAB — POCT INR: INR: 1.3

## 2014-11-10 MED ORDER — ENOXAPARIN SODIUM 120 MG/0.8ML ~~LOC~~ SOLN: 120.0000 mg | SUBCUTANEOUS | Status: DC

## 2014-11-10 NOTE — Progress Notes (Signed)
INR = 1.3 drawn today at [facility] - Address: [address], [place] [address]; [phone]; Fax: [phone] Pt currently on Lovenox 120 mg SQ daily (decreased dose for renal insufficieny) along w/ Coumadin 10 mg daily.  He restarted anticoag on Mon 2/29 following bone marrow bx so he has gotten 4 doses of Coumadin. Prior to 11/06/14, Coumadin had been on hold since Thursday 10/27/14 when patient was seen with H/H = 8.2/24.2 and required PRBCs. He had dark stools at the time. Colonoscopy will be scheduled soon. Pt in process of relocating to [place], [place] for job opportunity.   INR subtherapeutic.  He was on lower dose previously when monitored by [place] (on 65 mg/week; took 1-2 days of 5 mg and the rest 10 mg's) & INR = 4.7 and 4.6. I have discussed plan w/ pts wife, [name] over phone (ph# [phone]): continue Coumadin 10 mg daily and Lovenox 120 mg SQ Q24 hrs (RX called in to [id number] on [address], [place], [place] ph# [phone]).   He will be seeing nephrologist in [place] on 11/15/14.  I had already faxed that office orders for PT/INR he will have INR drawn then. Once we have results, will need to call [name] w/ dosing instructions. NO CHARGE - Phone encounter. Kennith Center, Pharm.D., CPP 11/10/2014@3 :25 PM

## 2014-11-15 ENCOUNTER — Telehealth: Payer: Self-pay | Admitting: Pharmacist

## 2014-11-15 LAB — POCT INR: INR: 1.8

## 2014-11-15 LAB — CHROMOSOME ANALYSIS, BONE MARROW

## 2014-11-15 NOTE — Telephone Encounter (Signed)
I have not received INR results yet on [name] today so I called [facility]. Phone# [phone]. The representative I s/w over the phone stated that the PT/INR was drawn today but the results would not be back today, we should call back tomorrow. When I asked if the results were processed as STAT per my faxed in order, she stated they don't run PT/INR's as STAT, they are sent out & she doubted that the person who drew that lab even knew to process as STAT. I specified on the order I faxed on 11/08/14 that the results should be faxed or phoned to our Coumadin clinic but the representative said they would not do that for [place].  She said we should call back tomorrow.  She said they may be in touch w/ the pt tomorrow. I have left a voicemail for pts wife, [name] to call pharmacy back to discuss above conversation & that we will not have a result today for them, unfortunately.  [place] should continue w/ current plan until we have the INR back. Kennith Center, Pharm.D., CPP 11/15/2014@2 :14 PM

## 2014-11-16 ENCOUNTER — Ambulatory Visit (INDEPENDENT_AMBULATORY_CARE_PROVIDER_SITE_OTHER): Payer: 59 | Admitting: Pharmacist

## 2014-11-16 DIAGNOSIS — I2699 Other pulmonary embolism without acute cor pulmonale: Secondary | ICD-10-CM

## 2014-11-16 DIAGNOSIS — D500 Iron deficiency anemia secondary to blood loss (chronic): Secondary | ICD-10-CM

## 2014-11-16 DIAGNOSIS — Z5181 Encounter for therapeutic drug level monitoring: Secondary | ICD-10-CM

## 2014-11-16 NOTE — Progress Notes (Signed)
Spoke with rep at [name] to get INR result from 3.9.16  INR=1.8 Called patients wife Isaiah [name] to pass along instructions as continue lovenox for 3 more doses as well as the 10 mg coumadin daily She would call back if he does not have 3 doses left. He is expected back at [facility] on Mar 16  New order was faxed to [facility] [phone]) We have to call for the results, as they do not result them STAT (even if ordered as such) and they will not call us with the results.  She understood instructions and plans to pass along to husband.  Continue Lovenox 120 mg SQ Q24 hrs for 3 more doses Continue Coumadin 10 mg daily Have INR drawn at [name] on 11/22/14.   We will call your wife, Isaiah [name], w/ results & dosing instructions (ph# [phone])

## 2014-11-16 NOTE — Patient Instructions (Signed)
Continue Lovenox 120 mg SQ Q24 hrs for 3 more doses Continue Coumadin 10 mg daily Have INR drawn at [facility] on 11/22/14.  We will call your wife, [name] w/ results & dosing instructions (ph# [phone])

## 2014-11-21 ENCOUNTER — Encounter (HOSPITAL_COMMUNITY): Payer: Self-pay

## 2014-11-22 LAB — TISSUE HYBRIDIZATION (BONE MARROW)-NCBH

## 2014-11-23 ENCOUNTER — Telehealth: Payer: Self-pay | Admitting: Pharmacist

## 2014-11-23 NOTE — Telephone Encounter (Signed)
I called [facility] and requested lab results to be faxed to [place]. The lab results which were faxed were from 11/15/14. I called again and was told Isaiah [name] had not had labs drawn today or yesterday. I then called patients wife, Isaiah [name] [phone]). She states that he had labs drawn today, but not at [facility]. Today's labs were drawn at his new PCP. She will have the results faxed to [place]. I gave her our fax number. When we receive the INR results, we will call her with instructions for her husband.

## 2014-11-24 ENCOUNTER — Telehealth: Payer: Self-pay | Admitting: Pharmacist

## 2014-11-24 NOTE — Telephone Encounter (Signed)
I s/w pts [name] over phone this evening.  She stated that the PCP office (? MD not reported) that drew her husband's INR yesterday never called w/ results.  We never received a fax from their office today either.  Their office closes at 2 pm on Fridays. Per [name], that PCP will be taking over the Coumadin management but [name] is going to call them on Monday (3/21) morning and get confirmation that they will manage the Coumadin from now on. [name] will call us & let us know plan on Mon (3/21). For now, I instructed her to have Zerrick continue Coumadin 10 mg daily. Kennith Center, Pharm.D., CPP 11/24/2014@4 :25 PM

## 2014-12-01 ENCOUNTER — Telehealth: Payer: Self-pay | Admitting: Pharmacist

## 2014-12-01 NOTE — Telephone Encounter (Signed)
Received the following email correspondence from pts wife, [facility]: "I just wanted to informed [you] that Maximum's INR from 03/17 was 2.0 and his pcp in [employer] [place] will monitor his INR from this day until her returns to [place].  Thank [you] so very much for all your help and concern about my husband."  Have archived Casimiro from [name] until further notice. Kennith Center, Pharm.D., CPP 12/01/2014@9 :02 AM

## 2014-12-11 ENCOUNTER — Encounter: Payer: Self-pay | Admitting: Oncology

## 2015-03-22 ENCOUNTER — Ambulatory Visit: Payer: Self-pay

## 2015-03-22 ENCOUNTER — Ambulatory Visit (INDEPENDENT_AMBULATORY_CARE_PROVIDER_SITE_OTHER): Payer: 59 | Admitting: Family Medicine

## 2015-03-22 VITALS — BP 153/89 | HR 81 | Wt 261.0 lb

## 2015-03-22 DIAGNOSIS — E119 Type 2 diabetes mellitus without complications: Secondary | ICD-10-CM

## 2015-03-22 NOTE — Progress Notes (Signed)
Patient returns for 3 mo f/u DM as part of the employee sponsored link to wellness program. Medications have been reviewed. I have also discussed with patient lifestyle interventions such as diet and exercise.  Diabetes- A1C from MD in may 5.9, at goal. 14 day blood glucose average 112. 210 highest and 72 lowest. NO recommended changes Hypertension-blood pressure elevated today. Patient has moved to [place] and has a new doctor there (only comes back to [place] to visit son/wife occasionally). The new doctor took him off amlodipine and metoprolol. Started him on lisinopril but patient is unaware of dose. He is going to call me with dose (he is getting filled in [place]). Told patient to monitor blood pressure at home. Reviewed goal blood pressure of <140/90. Hyperlipidemia- on appropriately dosed statin  Patient has set a series of personal goals and will f/u in 3 mo for further review of DM.

## 2015-03-22 NOTE — Patient Instructions (Signed)
Increase exercise to 4 days per week. Congratulations on 3 lb wt loss!

## 2015-03-28 ENCOUNTER — Ambulatory Visit (INDEPENDENT_AMBULATORY_CARE_PROVIDER_SITE_OTHER): Payer: 59 | Admitting: Ophthalmology

## 2015-03-30 NOTE — Progress Notes (Signed)
Patient ID: [place], male   DOB: 23-Apr-1966, 49 y.o.   MRN: [id number] ATTENDING PHYSICIAN NOTE: I have reviewed the chart and agree with the plan as detailed above. Dorcas Mcmurray MD Pager [phone]

## 2015-08-06 ENCOUNTER — Telehealth: Payer: Self-pay | Admitting: Oncology

## 2015-08-06 NOTE — Telephone Encounter (Signed)
Per response from FS ok to schedule patient for f/u late December per wife request. Per FS add lab to f/u. Spoke with patient wife re lab/FS 12/29 @ 1 pm.

## 2015-08-09 ENCOUNTER — Encounter: Payer: Self-pay | Admitting: Cardiovascular Disease

## 2015-09-04 ENCOUNTER — Other Ambulatory Visit: Payer: Self-pay | Admitting: Oncology

## 2015-09-04 DIAGNOSIS — D472 Monoclonal gammopathy: Secondary | ICD-10-CM

## 2015-09-06 ENCOUNTER — Ambulatory Visit (HOSPITAL_BASED_OUTPATIENT_CLINIC_OR_DEPARTMENT_OTHER): Payer: BLUE CROSS/BLUE SHIELD | Admitting: Oncology

## 2015-09-06 ENCOUNTER — Other Ambulatory Visit (HOSPITAL_BASED_OUTPATIENT_CLINIC_OR_DEPARTMENT_OTHER): Payer: BLUE CROSS/BLUE SHIELD

## 2015-09-06 VITALS — BP 180/90 | HR 95 | Temp 98.1°F | Resp 18 | Ht 70.0 in | Wt 241.6 lb

## 2015-09-06 DIAGNOSIS — D472 Monoclonal gammopathy: Secondary | ICD-10-CM | POA: Diagnosis not present

## 2015-09-06 DIAGNOSIS — N289 Disorder of kidney and ureter, unspecified: Secondary | ICD-10-CM | POA: Diagnosis not present

## 2015-09-06 DIAGNOSIS — I2699 Other pulmonary embolism without acute cor pulmonale: Secondary | ICD-10-CM

## 2015-09-06 DIAGNOSIS — I82409 Acute embolism and thrombosis of unspecified deep veins of unspecified lower extremity: Secondary | ICD-10-CM

## 2015-09-06 DIAGNOSIS — D649 Anemia, unspecified: Secondary | ICD-10-CM

## 2015-09-06 LAB — COMPREHENSIVE METABOLIC PANEL
ALT: 9 U/L (ref 0–55)
AST: 11 U/L (ref 5–34)
Albumin: 3.5 g/dL (ref 3.5–5.0)
Alkaline Phosphatase: 122 U/L (ref 40–150)
Anion Gap: 8 mEq/L (ref 3–11)
BUN: 54 mg/dL — ABNORMAL HIGH (ref 7.0–26.0)
CO2: 15 mEq/L — ABNORMAL LOW (ref 22–29)
Calcium: 7.6 mg/dL — ABNORMAL LOW (ref 8.4–10.4)
Chloride: 115 mEq/L — ABNORMAL HIGH (ref 98–109)
Creatinine: 5.8 mg/dL (ref 0.7–1.3)
EGFR: 12 mL/min/{1.73_m2} — ABNORMAL LOW (ref >90)
Glucose: 98 mg/dl (ref 70–140)
Potassium: 4.8 mEq/L (ref 3.5–5.1)
Sodium: 138 mEq/L (ref 136–145)
Total Bilirubin: 1.28 mg/dL — ABNORMAL HIGH (ref 0.20–1.20)
Total Protein: 9.2 g/dL — ABNORMAL HIGH (ref 6.4–8.3)

## 2015-09-06 LAB — CBC WITH DIFFERENTIAL/PLATELET
BASO%: 0.5 % (ref 0.0–2.0)
Basophils Absolute: 0.1 10*3/uL (ref 0.0–0.1)
EOS%: 1.5 % (ref 0.0–7.0)
Eosinophils Absolute: 0.3 10*3/uL (ref 0.0–0.5)
HCT: 32.8 % — ABNORMAL LOW (ref 38.4–49.9)
HGB: 11.3 g/dL — ABNORMAL LOW (ref 13.0–17.1)
LYMPH%: 9.2 % — ABNORMAL LOW (ref 14.0–49.0)
MCH: 26.5 pg — ABNORMAL LOW (ref 27.2–33.4)
MCHC: 34.5 g/dL (ref 32.0–36.0)
MCV: 77 fL — ABNORMAL LOW (ref 79.3–98.0)
MONO#: 1.1 10*3/uL — ABNORMAL HIGH (ref 0.1–0.9)
MONO%: 6.6 % (ref 0.0–14.0)
NEUT#: 14.1 10*3/uL — ABNORMAL HIGH (ref 1.5–6.5)
NEUT%: 82.2 % — ABNORMAL HIGH (ref 39.0–75.0)
Platelets: 322 10*3/uL (ref 140–400)
RBC: 4.26 10*6/uL (ref 4.20–5.82)
RDW: 19.1 % — ABNORMAL HIGH (ref 11.0–14.6)
WBC: 17.1 10*3/uL — ABNORMAL HIGH (ref 4.0–10.3)
lymph#: 1.6 10*3/uL (ref 0.9–3.3)
nRBC: 1 % — ABNORMAL HIGH (ref 0–0)

## 2015-09-06 LAB — IRON AND TIBC
%SAT: 28 % (ref 20–55)
Iron: 74 ug/dL (ref 42–163)
TIBC: 265 ug/dL (ref 202–409)
UIBC: 191 ug/dL (ref 117–376)

## 2015-09-06 LAB — FERRITIN: Ferritin: 97 ng/ml (ref 22–316)

## 2015-09-06 NOTE — Progress Notes (Signed)
Hematology and Oncology Follow Up Visit  Isaiah [name] [id number] Dec 11, 1965 49 y.o. 09/06/2015 2:19 PM    Principle Diagnosis: This is a 49 year old gentleman with the following diagnoses:   1. Monoclonal protein in the form of IgG kappa, likely represents monoclonal gammopathy, undetermined significance versus a smoldering myeloma. He presented with M spike about 1 g/dL and bone marrow biopsy showed 7% plasma cell involvement without evidence of myeloma in 12/2010. He did not have any lytic bone lesions.    Repeat a bone marrow biopsy in 10/16/2014 showed 13% plasma cell infiltration and no lytic bone lesions on his skeletal survey.  2. Multifactorial anemia: The etiology includes anemia of renal disease, plasma cell disorder, myeloproliferative disorder, and GI bleeding  Interim History:  Mr. Selders presents today for a followup visit with his wife. Since the last visit, he relocated to [place] where he has been getting his routine medical care. He has established care with nephrology and a medical doctor but have not seen hematology since his last visit in February 2016. He did report 2 hospitalization because of worsening anemia that required paclitaxel transfusions. Since that time, his Coumadin have been discontinued and have been receiving Procrit. This has helped his hemoglobin and decreased his transfusion needs. He is approaching hemodialysis however and likely will require peritoneal dialysis.   Despite his health issues, he is continued to work full time and has not reported any decline in his overall health. He has reported some occasional exertional dyspnea in summary occasional fatigue.   He does not report any headaches, blurry vision, syncope or seizures. He does not report any  palpitation, orthopnea or leg edema. Does not report any cough or hemoptysis or hematemesis. He does not report any nausea, vomiting. He does not report any frequency, urgency or hesitancy. He does  not report any skeletal complaints of arthralgias myalgias or pathological fractures. He does not report any opportunistic infections or recurrent infections. Rest of his review of systems unremarkable.   Medications: I have reviewed the patient's current medications.  Current outpatient prescriptions:  .  aspirin 81 MG chewable tablet, Chew 81 mg by mouth daily., Disp: , Rfl:  .  atorvastatin (LIPITOR) 10 MG tablet, Take 10 mg by mouth daily., Disp: , Rfl:  .  cetirizine (ZYRTEC) 10 MG tablet, Take 10 mg by mouth daily. , Disp: , Rfl:  .  colchicine 0.6 MG tablet, Take 0.6 mg by mouth daily., Disp: , Rfl:  .  epoetin alfa (EPOGEN,PROCRIT) 63893 UNIT/ML injection, Inject 10,000 Units into the skin every 30 (thirty) days., Disp: , Rfl:  .  furosemide (LASIX) 20 MG tablet, Take 20 mg by mouth daily as needed for fluid. , Disp: , Rfl:  .  insulin glargine (LANTUS) 100 UNIT/ML injection, Inject 40 Units into the skin at bedtime., Disp: , Rfl:  .  insulin lispro (HUMALOG) 100 UNIT/ML injection, Inject 10 Units into the skin 3 (three) times daily before meals., Disp: , Rfl:  .  lisinopril (PRINIVIL,ZESTRIL) 10 MG tablet, Take 10 mg by mouth daily., Disp: , Rfl:  .  montelukast (SINGULAIR) 10 MG tablet, Take 10 mg by mouth at bedtime., Disp: , Rfl:  .  sodium bicarbonate 650 MG tablet, Take 650 mg by mouth 3 (three) times daily., Disp: , Rfl:   Allergies:  Allergies  Allergen Reactions  . Codeine Other (See Comments)    Pt unknown  . Codeine Rash and Other (See Comments)    Unknown reaction (patient says  it was more serious than just a rash, but he can't remember what happened)    Past Medical History, Surgical history, Social history, and Family History were reviewed and updated.  Physical Exam: Blood pressure 180/90, pulse 95, temperature 98.1 F (36.7 C), temperature source Oral, resp. rate 18, height 5' 10"  (1.778 m), weight 241 lb 9.6 oz (109.589 kg), SpO2 90 %. ECOG: 1 General  appearance: alert, awake gentleman appeared in no active distress. Head: Normocephalic, without obvious abnormality no oral ulcers or lesions. Neck: no adenopathy Lymph nodes: Cervical, supraclavicular, and axillary nodes normal. Heart:regular rate and rhythm, S1, S2 normal, no murmur, click, rub or gallop Lung:chest clear, no wheezing, rales, normal symmetric air entry. No rhonchi or dullness to percussion. Abdomin: soft, non-tender, without masses or organomegaly. No shifting dullness or ascites. EXT:no erythema, induration, or nodules   Lab Results: Lab Results  Component Value Date   WBC 17.1* 09/06/2015   HGB 11.3* 09/06/2015   HCT 32.8* 09/06/2015   MCV 77.0* 09/06/2015   PLT 322 09/06/2015     Chemistry      Component Value Date/Time   NA 132* 10/27/2014 0854   NA 136 10/01/2014 0313   K 3.4* 10/27/2014 0854   K 4.9 10/01/2014 0313   CL 114* 10/01/2014 0313   CO2 18* 10/27/2014 0854   CO2 13* 10/01/2014 0313   BUN 60.4* 10/27/2014 0854   BUN 44* 10/01/2014 0313   CREATININE 4.8* 10/27/2014 0854   CREATININE 4.12* 10/01/2014 0313      Component Value Date/Time   CALCIUM 7.6* 10/27/2014 0854   CALCIUM 8.2* 10/01/2014 0313   ALKPHOS 113 10/27/2014 0854   ALKPHOS 113 01/16/2012 1117   AST 18 10/27/2014 0854   AST 14 01/16/2012 1117   ALT 9 10/27/2014 0854   ALT 11 01/16/2012 1117   BILITOT 0.99 10/27/2014 0854   BILITOT 0.9 01/16/2012 1117       Impression and Plan: This is a 49 year old gentleman with the following issues:   1. Monoclonal gammopathy, IgG subtype likely monoclonal gammopathy of undetermined significance. His initial workup in 2012 did not show clear-cut evidence to suggest end-organ damage. His repeat skeletal survey on 10/20/2014 4within normal range except for very few questionable areas that appear to be chronic and less likely malignant. His bone marrow biopsy in February 2016 showed slight increase in his plasmacytosis to around 13%. This  most likely represents smoldering myeloma and could be evolving into active myeloma.   He has relocated to [place] for work purposes and have not established care with oncology. I have repeated his serum protein electrophoresis today and quantitative immunoglobulins. If his protein studies rising, I will consider treating him for active myeloma at that time. I have encouraged him to establish care with an oncologist in [place] for better monitoring moving forward.  2. Anemia, which is multifactorial and definitely has an element of hemoglobinopathy. He has been receiving Procrit by his nephrologist and his hemoglobin today is adequate.  3. Renal insufficiency: Likely related to long-standing hypertension and diabetes and less likely a plasma cell disorder. He follows with nephrology  And approaching dialysis at this time.  4. Pulmonary embolism: He has a documented DVT as well by an ultrasound Doppler done on 09/28/2014. He has been off Coumadin because of recurrent GI bleeding.  5. Follow-up: Will be as needed in the future. He currently lives in [place] but occasionally visits this area and once he is in this area would like to  follow up with me at that time. I'll be more than happy to accommodate his needs at this time I encouraged him to establish care with oncology as soon as possible when he travels back to [place] next week.    AXENMM,HWKGS, MD 12/29/20162:19 PM

## 2015-09-09 DIAGNOSIS — I639 Cerebral infarction, unspecified: Secondary | ICD-10-CM

## 2015-09-09 HISTORY — DX: Cerebral infarction, unspecified: I63.9

## 2015-09-10 LAB — SPEP & IFE WITH QIG
Abnormal Protein Band1: 2.1 g/dL
Albumin ELP: 3.8 g/dL (ref 3.8–4.8)
Alpha-1-Globulin: 0.4 g/dL — ABNORMAL HIGH (ref 0.2–0.3)
Alpha-2-Globulin: 0.8 g/dL (ref 0.5–0.9)
Beta 2: 0.3 g/dL (ref 0.2–0.5)
Beta Globulin: 0.4 g/dL (ref 0.4–0.6)
Gamma Globulin: 2.8 g/dL — ABNORMAL HIGH (ref 0.8–1.7)
IgA: 105 mg/dL (ref 68–379)
IgG (Immunoglobin G), Serum: 2870 mg/dL — ABNORMAL HIGH (ref 650–1600)
IgM, Serum: 34 mg/dL — ABNORMAL LOW (ref 41–251)
Total Protein, Serum Electrophoresis: 8.5 g/dL — ABNORMAL HIGH (ref 6.1–8.1)

## 2015-09-13 ENCOUNTER — Encounter: Payer: Self-pay | Admitting: *Deleted

## 2015-09-13 ENCOUNTER — Encounter: Payer: Self-pay | Admitting: Oncology

## 2015-09-13 NOTE — Progress Notes (Signed)
Wife sylvia notified that letter for request to be excused from jury duty is ready for p/u, at front desk. along with copy of recent labs.

## 2015-09-14 DIAGNOSIS — G4733 Obstructive sleep apnea (adult) (pediatric): Secondary | ICD-10-CM | POA: Diagnosis not present

## 2015-09-16 DIAGNOSIS — I120 Hypertensive chronic kidney disease with stage 5 chronic kidney disease or end stage renal disease: Secondary | ICD-10-CM | POA: Diagnosis not present

## 2015-09-16 DIAGNOSIS — K3189 Other diseases of stomach and duodenum: Secondary | ICD-10-CM | POA: Diagnosis not present

## 2015-09-16 DIAGNOSIS — I2609 Other pulmonary embolism with acute cor pulmonale: Secondary | ICD-10-CM | POA: Diagnosis not present

## 2015-09-16 DIAGNOSIS — I639 Cerebral infarction, unspecified: Secondary | ICD-10-CM | POA: Diagnosis not present

## 2015-09-16 DIAGNOSIS — K210 Gastro-esophageal reflux disease with esophagitis: Secondary | ICD-10-CM | POA: Diagnosis not present

## 2015-09-16 DIAGNOSIS — K2970 Gastritis, unspecified, without bleeding: Secondary | ICD-10-CM | POA: Diagnosis not present

## 2015-09-16 DIAGNOSIS — K2940 Chronic atrophic gastritis without bleeding: Secondary | ICD-10-CM | POA: Diagnosis not present

## 2015-09-16 DIAGNOSIS — J219 Acute bronchiolitis, unspecified: Secondary | ICD-10-CM | POA: Diagnosis not present

## 2015-09-16 DIAGNOSIS — J189 Pneumonia, unspecified organism: Secondary | ICD-10-CM | POA: Diagnosis not present

## 2015-09-16 DIAGNOSIS — I6310 Cerebral infarction due to embolism of unspecified precerebral artery: Secondary | ICD-10-CM | POA: Diagnosis not present

## 2015-09-16 DIAGNOSIS — R1084 Generalized abdominal pain: Secondary | ICD-10-CM | POA: Diagnosis not present

## 2015-09-16 DIAGNOSIS — R05 Cough: Secondary | ICD-10-CM | POA: Diagnosis not present

## 2015-09-16 DIAGNOSIS — R0600 Dyspnea, unspecified: Secondary | ICD-10-CM | POA: Diagnosis not present

## 2015-09-16 DIAGNOSIS — I69320 Aphasia following cerebral infarction: Secondary | ICD-10-CM | POA: Diagnosis not present

## 2015-09-16 DIAGNOSIS — I82402 Acute embolism and thrombosis of unspecified deep veins of left lower extremity: Secondary | ICD-10-CM | POA: Diagnosis not present

## 2015-09-16 DIAGNOSIS — I2699 Other pulmonary embolism without acute cor pulmonale: Secondary | ICD-10-CM | POA: Diagnosis not present

## 2015-09-16 DIAGNOSIS — D62 Acute posthemorrhagic anemia: Secondary | ICD-10-CM | POA: Diagnosis not present

## 2015-09-16 DIAGNOSIS — R197 Diarrhea, unspecified: Secondary | ICD-10-CM | POA: Diagnosis not present

## 2015-09-16 DIAGNOSIS — N179 Acute kidney failure, unspecified: Secondary | ICD-10-CM | POA: Diagnosis not present

## 2015-09-16 DIAGNOSIS — K209 Esophagitis, unspecified: Secondary | ICD-10-CM | POA: Diagnosis not present

## 2015-09-16 DIAGNOSIS — R0602 Shortness of breath: Secondary | ICD-10-CM | POA: Diagnosis not present

## 2015-09-16 DIAGNOSIS — Q211 Atrial septal defect: Secondary | ICD-10-CM | POA: Diagnosis not present

## 2015-09-16 DIAGNOSIS — K921 Melena: Secondary | ICD-10-CM | POA: Diagnosis not present

## 2015-09-16 DIAGNOSIS — K922 Gastrointestinal hemorrhage, unspecified: Secondary | ICD-10-CM | POA: Diagnosis not present

## 2015-09-16 DIAGNOSIS — R1310 Dysphagia, unspecified: Secondary | ICD-10-CM | POA: Diagnosis not present

## 2015-09-16 DIAGNOSIS — K319 Disease of stomach and duodenum, unspecified: Secondary | ICD-10-CM | POA: Diagnosis not present

## 2015-09-16 DIAGNOSIS — E869 Volume depletion, unspecified: Secondary | ICD-10-CM | POA: Diagnosis not present

## 2015-09-16 DIAGNOSIS — N186 End stage renal disease: Secondary | ICD-10-CM | POA: Diagnosis not present

## 2015-09-16 DIAGNOSIS — R112 Nausea with vomiting, unspecified: Secondary | ICD-10-CM | POA: Diagnosis not present

## 2015-09-16 DIAGNOSIS — K2950 Unspecified chronic gastritis without bleeding: Secondary | ICD-10-CM | POA: Diagnosis not present

## 2015-09-16 DIAGNOSIS — J320 Chronic maxillary sinusitis: Secondary | ICD-10-CM | POA: Diagnosis not present

## 2015-09-16 DIAGNOSIS — L988 Other specified disorders of the skin and subcutaneous tissue: Secondary | ICD-10-CM | POA: Diagnosis not present

## 2015-09-16 DIAGNOSIS — R000 Tachycardia, unspecified: Secondary | ICD-10-CM | POA: Diagnosis not present

## 2015-09-16 DIAGNOSIS — I638 Other cerebral infarction: Secondary | ICD-10-CM | POA: Diagnosis not present

## 2015-09-16 DIAGNOSIS — K859 Acute pancreatitis without necrosis or infection, unspecified: Secondary | ICD-10-CM | POA: Diagnosis not present

## 2015-09-16 DIAGNOSIS — R897 Abnormal histological findings in specimens from other organs, systems and tissues: Secondary | ICD-10-CM | POA: Diagnosis not present

## 2015-09-16 DIAGNOSIS — I82432 Acute embolism and thrombosis of left popliteal vein: Secondary | ICD-10-CM | POA: Diagnosis not present

## 2015-09-16 DIAGNOSIS — E872 Acidosis: Secondary | ICD-10-CM | POA: Diagnosis not present

## 2015-09-16 DIAGNOSIS — R600 Localized edema: Secondary | ICD-10-CM | POA: Diagnosis not present

## 2015-09-16 DIAGNOSIS — E119 Type 2 diabetes mellitus without complications: Secondary | ICD-10-CM | POA: Diagnosis not present

## 2015-09-16 DIAGNOSIS — G629 Polyneuropathy, unspecified: Secondary | ICD-10-CM | POA: Diagnosis not present

## 2015-09-16 DIAGNOSIS — N281 Cyst of kidney, acquired: Secondary | ICD-10-CM | POA: Diagnosis not present

## 2015-09-16 DIAGNOSIS — K449 Diaphragmatic hernia without obstruction or gangrene: Secondary | ICD-10-CM | POA: Diagnosis not present

## 2015-09-16 DIAGNOSIS — R748 Abnormal levels of other serum enzymes: Secondary | ICD-10-CM | POA: Diagnosis not present

## 2015-09-16 DIAGNOSIS — Z452 Encounter for adjustment and management of vascular access device: Secondary | ICD-10-CM | POA: Diagnosis not present

## 2015-09-16 DIAGNOSIS — R531 Weakness: Secondary | ICD-10-CM | POA: Diagnosis not present

## 2015-09-16 DIAGNOSIS — Z8673 Personal history of transient ischemic attack (TIA), and cerebral infarction without residual deficits: Secondary | ICD-10-CM | POA: Diagnosis not present

## 2015-10-12 ENCOUNTER — Encounter: Payer: Self-pay | Admitting: *Deleted

## 2015-10-12 ENCOUNTER — Inpatient Hospital Stay (HOSPITAL_COMMUNITY)
Admission: RE | Admit: 2015-10-12 | Discharge: 2015-11-02 | DRG: 056 | Disposition: A | Discharge status: Home Health | Payer: BLUE CROSS/BLUE SHIELD | Source: Intra-hospital | Attending: Physical Medicine & Rehabilitation | Admitting: Physical Medicine & Rehabilitation

## 2015-10-12 ENCOUNTER — Other Ambulatory Visit: Payer: Self-pay | Admitting: Physical Medicine and Rehabilitation

## 2015-10-12 DIAGNOSIS — Z86718 Personal history of other venous thrombosis and embolism: Secondary | ICD-10-CM | POA: Diagnosis not present

## 2015-10-12 DIAGNOSIS — I69391 Dysphagia following cerebral infarction: Secondary | ICD-10-CM

## 2015-10-12 DIAGNOSIS — I82401 Acute embolism and thrombosis of unspecified deep veins of right lower extremity: Secondary | ICD-10-CM | POA: Diagnosis not present

## 2015-10-12 DIAGNOSIS — D62 Acute posthemorrhagic anemia: Secondary | ICD-10-CM | POA: Diagnosis not present

## 2015-10-12 DIAGNOSIS — D5740 Sickle-cell thalassemia without crisis: Secondary | ICD-10-CM

## 2015-10-12 DIAGNOSIS — Q211 Atrial septal defect: Secondary | ICD-10-CM

## 2015-10-12 DIAGNOSIS — R1310 Dysphagia, unspecified: Secondary | ICD-10-CM | POA: Diagnosis not present

## 2015-10-12 DIAGNOSIS — D72829 Elevated white blood cell count, unspecified: Secondary | ICD-10-CM

## 2015-10-12 DIAGNOSIS — I69351 Hemiplegia and hemiparesis following cerebral infarction affecting right dominant side: Principal | ICD-10-CM

## 2015-10-12 DIAGNOSIS — I69398 Other sequelae of cerebral infarction: Secondary | ICD-10-CM | POA: Diagnosis not present

## 2015-10-12 DIAGNOSIS — E1122 Type 2 diabetes mellitus with diabetic chronic kidney disease: Secondary | ICD-10-CM | POA: Diagnosis not present

## 2015-10-12 DIAGNOSIS — R569 Unspecified convulsions: Secondary | ICD-10-CM

## 2015-10-12 DIAGNOSIS — I69320 Aphasia following cerebral infarction: Secondary | ICD-10-CM

## 2015-10-12 DIAGNOSIS — Z86711 Personal history of pulmonary embolism: Secondary | ICD-10-CM | POA: Diagnosis not present

## 2015-10-12 DIAGNOSIS — Z01810 Encounter for preprocedural cardiovascular examination: Secondary | ICD-10-CM

## 2015-10-12 DIAGNOSIS — I82432 Acute embolism and thrombosis of left popliteal vein: Secondary | ICD-10-CM | POA: Diagnosis not present

## 2015-10-12 DIAGNOSIS — I639 Cerebral infarction, unspecified: Secondary | ICD-10-CM | POA: Diagnosis not present

## 2015-10-12 DIAGNOSIS — Z452 Encounter for adjustment and management of vascular access device: Secondary | ICD-10-CM

## 2015-10-12 DIAGNOSIS — R269 Unspecified abnormalities of gait and mobility: Secondary | ICD-10-CM

## 2015-10-12 DIAGNOSIS — E1140 Type 2 diabetes mellitus with diabetic neuropathy, unspecified: Secondary | ICD-10-CM | POA: Diagnosis not present

## 2015-10-12 DIAGNOSIS — E1121 Type 2 diabetes mellitus with diabetic nephropathy: Secondary | ICD-10-CM

## 2015-10-12 DIAGNOSIS — E8889 Other specified metabolic disorders: Secondary | ICD-10-CM

## 2015-10-12 DIAGNOSIS — Z419 Encounter for procedure for purposes other than remedying health state, unspecified: Secondary | ICD-10-CM

## 2015-10-12 DIAGNOSIS — I2699 Other pulmonary embolism without acute cor pulmonale: Secondary | ICD-10-CM

## 2015-10-12 DIAGNOSIS — I120 Hypertensive chronic kidney disease with stage 5 chronic kidney disease or end stage renal disease: Secondary | ICD-10-CM | POA: Diagnosis not present

## 2015-10-12 DIAGNOSIS — D649 Anemia, unspecified: Secondary | ICD-10-CM | POA: Diagnosis not present

## 2015-10-12 DIAGNOSIS — I6340 Cerebral infarction due to embolism of unspecified cerebral artery: Secondary | ICD-10-CM

## 2015-10-12 DIAGNOSIS — I959 Hypotension, unspecified: Secondary | ICD-10-CM | POA: Diagnosis not present

## 2015-10-12 DIAGNOSIS — G4733 Obstructive sleep apnea (adult) (pediatric): Secondary | ICD-10-CM | POA: Diagnosis not present

## 2015-10-12 DIAGNOSIS — I6789 Other cerebrovascular disease: Secondary | ICD-10-CM | POA: Diagnosis not present

## 2015-10-12 DIAGNOSIS — I69392 Facial weakness following cerebral infarction: Secondary | ICD-10-CM | POA: Diagnosis not present

## 2015-10-12 DIAGNOSIS — N2581 Secondary hyperparathyroidism of renal origin: Secondary | ICD-10-CM | POA: Diagnosis not present

## 2015-10-12 DIAGNOSIS — I69359 Hemiplegia and hemiparesis following cerebral infarction affecting unspecified side: Secondary | ICD-10-CM

## 2015-10-12 DIAGNOSIS — D472 Monoclonal gammopathy: Secondary | ICD-10-CM | POA: Diagnosis not present

## 2015-10-12 DIAGNOSIS — N186 End stage renal disease: Secondary | ICD-10-CM | POA: Diagnosis not present

## 2015-10-12 DIAGNOSIS — I6310 Cerebral infarction due to embolism of unspecified precerebral artery: Secondary | ICD-10-CM

## 2015-10-12 DIAGNOSIS — Z992 Dependence on renal dialysis: Secondary | ICD-10-CM

## 2015-10-12 DIAGNOSIS — D631 Anemia in chronic kidney disease: Secondary | ICD-10-CM

## 2015-10-12 DIAGNOSIS — G8191 Hemiplegia, unspecified affecting right dominant side: Secondary | ICD-10-CM | POA: Diagnosis not present

## 2015-10-12 DIAGNOSIS — I6319 Cerebral infarction due to embolism of other precerebral artery: Secondary | ICD-10-CM | POA: Diagnosis not present

## 2015-10-12 DIAGNOSIS — N185 Chronic kidney disease, stage 5: Secondary | ICD-10-CM | POA: Diagnosis not present

## 2015-10-12 DIAGNOSIS — I638 Other cerebral infarction: Secondary | ICD-10-CM | POA: Diagnosis not present

## 2015-10-12 DIAGNOSIS — Z01818 Encounter for other preprocedural examination: Secondary | ICD-10-CM | POA: Diagnosis not present

## 2015-10-12 DIAGNOSIS — Z95828 Presence of other vascular implants and grafts: Secondary | ICD-10-CM

## 2015-10-12 DIAGNOSIS — N189 Chronic kidney disease, unspecified: Secondary | ICD-10-CM | POA: Diagnosis not present

## 2015-10-12 HISTORY — DX: Dependence on renal dialysis: Z99.2

## 2015-10-12 HISTORY — DX: End stage renal disease: N18.6

## 2015-10-12 LAB — RENAL FUNCTION PANEL
Albumin: 3 g/dL — ABNORMAL LOW (ref 3.5–5.0)
Anion gap: 16 — ABNORMAL HIGH (ref 5–15)
BUN: 42 mg/dL — ABNORMAL HIGH (ref 6–20)
CO2: 20 mmol/L — ABNORMAL LOW (ref 22–32)
Calcium: 9.2 mg/dL (ref 8.9–10.3)
Chloride: 96 mmol/L — ABNORMAL LOW (ref 101–111)
Creatinine, Ser: 6.07 mg/dL — ABNORMAL HIGH (ref 0.61–1.24)
GFR calc Af Amer: 11 mL/min — ABNORMAL LOW (ref >60)
GFR calc non Af Amer: 10 mL/min — ABNORMAL LOW (ref >60)
Glucose, Bld: 318 mg/dL — ABNORMAL HIGH (ref 65–99)
Phosphorus: 3.7 mg/dL (ref 2.5–4.6)
Potassium: 5.2 mmol/L — ABNORMAL HIGH (ref 3.5–5.1)
Sodium: 132 mmol/L — ABNORMAL LOW (ref 135–145)

## 2015-10-12 LAB — GLUCOSE, CAPILLARY
Glucose-Capillary: 289 mg/dL — ABNORMAL HIGH (ref 65–99)
Glucose-Capillary: 420 mg/dL — ABNORMAL HIGH (ref 65–99)

## 2015-10-12 LAB — CBC
HCT: 32.5 % — ABNORMAL LOW (ref 39.0–52.0)
Hemoglobin: 11.2 g/dL — ABNORMAL LOW (ref 13.0–17.0)
MCH: 29.7 pg (ref 26.0–34.0)
MCHC: 34.5 g/dL (ref 30.0–36.0)
MCV: 86.2 fL (ref 78.0–100.0)
Platelets: 267 10*3/uL (ref 150–400)
RBC: 3.77 MIL/uL — ABNORMAL LOW (ref 4.22–5.81)
RDW: 18.8 % — ABNORMAL HIGH (ref 11.5–15.5)
WBC: 16.2 10*3/uL — ABNORMAL HIGH (ref 4.0–10.5)

## 2015-10-12 MED ORDER — MONTELUKAST SODIUM 10 MG PO TABS
10.0000 mg | ORAL_TABLET | Freq: Every day | ORAL | Status: DC
  Administered 2015-10-12 – 2015-10-31 (×17): 10 mg via ORAL
  Filled 2015-10-12 (×21): qty 1

## 2015-10-12 MED ORDER — SENNOSIDES-DOCUSATE SODIUM 8.6-50 MG PO TABS: 1.0000 | ORAL_TABLET | Freq: Every evening | ORAL | Status: DC | PRN

## 2015-10-12 MED ORDER — ALUMINUM HYDROXIDE GEL 320 MG/5ML PO SUSP: 30.0000 mL | Freq: Four times a day (QID) | ORAL | Status: DC | PRN

## 2015-10-12 MED ORDER — IPRATROPIUM-ALBUTEROL 0.5-2.5 (3) MG/3ML IN SOLN
3.0000 mL | Freq: Four times a day (QID) | RESPIRATORY_TRACT | Status: DC
  Administered 2015-10-12: 3 mL via RESPIRATORY_TRACT
  Filled 2015-10-12: qty 3

## 2015-10-12 MED ORDER — INSULIN DETEMIR 100 UNIT/ML ~~LOC~~ SOLN
15.0000 [IU] | Freq: Every day | SUBCUTANEOUS | Status: DC
  Administered 2015-10-12 – 2015-10-13 (×2): 15 [IU] via SUBCUTANEOUS
  Filled 2015-10-12 (×3): qty 0.15

## 2015-10-12 MED ORDER — ASPIRIN EC 81 MG PO TBEC
81.0000 mg | DELAYED_RELEASE_TABLET | Freq: Every day | ORAL | Status: DC
  Administered 2015-10-13 – 2015-11-01 (×19): 81 mg via ORAL
  Filled 2015-10-12 (×21): qty 1

## 2015-10-12 MED ORDER — ESCITALOPRAM OXALATE 10 MG PO TABS: 10.0000 mg | ORAL_TABLET | Freq: Every day | ORAL | Status: DC

## 2015-10-12 MED ORDER — MIDODRINE HCL 5 MG PO TABS
5.0000 mg | ORAL_TABLET | Freq: Three times a day (TID) | ORAL | Status: DC
  Administered 2015-10-13 – 2015-10-21 (×19): 5 mg via ORAL
  Filled 2015-10-12 (×30): qty 1

## 2015-10-12 MED ORDER — INSULIN ASPART 100 UNIT/ML ~~LOC~~ SOLN
0.0000 [IU] | Freq: Every day | SUBCUTANEOUS | Status: DC
  Administered 2015-10-13: 2 [IU] via SUBCUTANEOUS
  Administered 2015-10-14 – 2015-10-17 (×3): 3 [IU] via SUBCUTANEOUS
  Administered 2015-10-19 – 2015-10-28 (×4): 2 [IU] via SUBCUTANEOUS

## 2015-10-12 MED ORDER — DIPHENHYDRAMINE HCL 12.5 MG/5ML PO ELIX: 12.5000 mg | ORAL_SOLUTION | Freq: Four times a day (QID) | ORAL | Status: DC | PRN

## 2015-10-12 MED ORDER — HEPARIN (PORCINE) IN NACL 100-0.45 UNIT/ML-% IJ SOLN
1400.0000 [IU]/h | INTRAMUSCULAR | Status: DC
  Administered 2015-10-12: 1200 [IU]/h via INTRAVENOUS
  Administered 2015-10-13: 1500 [IU]/h via INTRAVENOUS
  Administered 2015-10-14 – 2015-10-15 (×3): 1400 [IU]/h via INTRAVENOUS
  Filled 2015-10-12 (×6): qty 250

## 2015-10-12 MED ORDER — INSULIN ASPART 100 UNIT/ML ~~LOC~~ SOLN: 5.0000 [IU] | Freq: Once | SUBCUTANEOUS | Status: DC

## 2015-10-12 MED ORDER — BISACODYL 5 MG PO TBEC: 5.0000 mg | DELAYED_RELEASE_TABLET | Freq: Every day | ORAL | Status: DC | PRN

## 2015-10-12 MED ORDER — INSULIN ASPART 100 UNIT/ML ~~LOC~~ SOLN
10.0000 [IU] | Freq: Once | SUBCUTANEOUS | Status: AC
  Administered 2015-10-12: 10 [IU] via SUBCUTANEOUS

## 2015-10-12 MED ORDER — ACETAMINOPHEN 325 MG PO TABS: 325.0000 mg | ORAL_TABLET | ORAL | Status: DC | PRN

## 2015-10-12 MED ORDER — NON FORMULARY: 1.0000 | Freq: Every day | Status: DC

## 2015-10-12 MED ORDER — PANTOPRAZOLE SODIUM 40 MG PO TBEC
40.0000 mg | DELAYED_RELEASE_TABLET | Freq: Two times a day (BID) | ORAL | Status: DC
  Administered 2015-10-12 – 2015-11-01 (×40): 40 mg via ORAL
  Filled 2015-10-12 (×42): qty 1

## 2015-10-12 MED ORDER — ENOXAPARIN SODIUM 100 MG/ML ~~LOC~~ SOLN
100.0000 mg | Freq: Every day | SUBCUTANEOUS | Status: DC
  Filled 2015-10-12: qty 1

## 2015-10-12 MED ORDER — INSULIN ASPART 100 UNIT/ML ~~LOC~~ SOLN
0.0000 [IU] | Freq: Three times a day (TID) | SUBCUTANEOUS | Status: DC
Start: 2015-10-12 — End: 2015-11-02
  Administered 2015-10-13: 3 [IU] via SUBCUTANEOUS
  Administered 2015-10-13: 9 [IU] via SUBCUTANEOUS
  Administered 2015-10-14: 3 [IU] via SUBCUTANEOUS
  Administered 2015-10-14: 5 [IU] via SUBCUTANEOUS
  Administered 2015-10-14: 7 [IU] via SUBCUTANEOUS
  Administered 2015-10-15: 1 [IU] via SUBCUTANEOUS
  Administered 2015-10-15: 3 [IU] via SUBCUTANEOUS
  Administered 2015-10-15: 7 [IU] via SUBCUTANEOUS
  Administered 2015-10-16 (×2): 2 [IU] via SUBCUTANEOUS
  Administered 2015-10-17 (×2): 3 [IU] via SUBCUTANEOUS
  Administered 2015-10-18: 5 [IU] via SUBCUTANEOUS
  Administered 2015-10-18: 2 [IU] via SUBCUTANEOUS
  Administered 2015-10-19: 5 [IU] via SUBCUTANEOUS
  Administered 2015-10-19: 1 [IU] via SUBCUTANEOUS
  Administered 2015-10-21: 2 [IU] via SUBCUTANEOUS
  Administered 2015-10-21: 3 [IU] via SUBCUTANEOUS
  Administered 2015-10-22: 2 [IU] via SUBCUTANEOUS
  Administered 2015-10-22: 3 [IU] via SUBCUTANEOUS
  Administered 2015-10-22: 1 [IU] via SUBCUTANEOUS
  Administered 2015-10-23 – 2015-10-24 (×2): 3 [IU] via SUBCUTANEOUS
  Administered 2015-10-24: 1 [IU] via SUBCUTANEOUS
  Administered 2015-10-25: 5 [IU] via SUBCUTANEOUS
  Administered 2015-10-25: 1 [IU] via SUBCUTANEOUS
  Administered 2015-10-26: 5 [IU] via SUBCUTANEOUS
  Administered 2015-10-27 (×2): 2 [IU] via SUBCUTANEOUS
  Administered 2015-10-27: 1 [IU] via SUBCUTANEOUS
  Administered 2015-10-28: 2 [IU] via SUBCUTANEOUS
  Administered 2015-10-28: 1 [IU] via SUBCUTANEOUS
  Administered 2015-10-28: 2 [IU] via SUBCUTANEOUS
  Administered 2015-10-29: 1 [IU] via SUBCUTANEOUS
  Administered 2015-10-29: 3 [IU] via SUBCUTANEOUS
  Administered 2015-10-29 – 2015-10-30 (×2): 1 [IU] via SUBCUTANEOUS
  Administered 2015-10-30: 3 [IU] via SUBCUTANEOUS
  Administered 2015-10-31: 1 [IU] via SUBCUTANEOUS
  Administered 2015-10-31 (×2): 3 [IU] via SUBCUTANEOUS
  Administered 2015-11-01: 2 [IU] via SUBCUTANEOUS
  Administered 2015-11-01: 1 [IU] via SUBCUTANEOUS

## 2015-10-12 MED ORDER — TRAZODONE HCL 50 MG PO TABS: 25.0000 mg | ORAL_TABLET | Freq: Every evening | ORAL | Status: DC | PRN

## 2015-10-12 MED ORDER — IPRATROPIUM-ALBUTEROL 0.5-2.5 (3) MG/3ML IN SOLN: 3.0000 mL | RESPIRATORY_TRACT | Status: DC | PRN

## 2015-10-12 MED ORDER — TRAMADOL HCL 50 MG PO TABS
50.0000 mg | ORAL_TABLET | Freq: Two times a day (BID) | ORAL | Status: DC | PRN
  Administered 2015-10-14: 50 mg via ORAL
  Filled 2015-10-12: qty 1

## 2015-10-12 MED ORDER — SIMETHICONE 80 MG PO CHEW: 80.0000 mg | CHEWABLE_TABLET | Freq: Four times a day (QID) | ORAL | Status: DC | PRN

## 2015-10-12 NOTE — Progress Notes (Signed)
Pharmacy Documentation  Spoke with Reesa Chew, PA-C regarding patient's anticoagulation therapy.   Pt history is currently limited as he is a new transfer from [place], but notes state he had a recent PE/DVT in January and also had strokes on 09/29/15 and 09/30/15.   Pt is also a dialysis patient. Per notes, pt was on heparin for DVT/PE, but was switched to Lovenox after embolic strokes on heparin. It is unclear if his heparin level was therapeutic during these events. Patient has a PFO and was also receiving Procrit, both would increase stroke risk.  Patient appears to have been on warfarin at some point in time, but this was discontinued due to GI bleeding.  I strongly advised Ms. Love against the use of Lovenox in this patient for treatment of DVT/PE due to accumulation in dialysis patients and the higher incidence of hemorrhagic conversion of ischemic strokes compared to heparin. A heparin drip would be the preferred anticoagulant for this patient. Due to complexity of this patient , I also recommended involvement of hematology service. Ms. Erling Cruz wished to continue Lovenox treatment. I reduced the dose to 1 mg/kg daily for his renal function.   Pharmacy will follow along.  Governor Specking, PharmD Clinical Pharmacy Resident Pager: [phone]

## 2015-10-12 NOTE — Progress Notes (Signed)
ANTICOAGULATION CONSULT NOTE - Initial Consult  Pharmacy Consult for heparin Indication: hx of PE and rect embolic CVAs  Allergies  Allergen Reactions  . Codeine Other (See Comments)    Pt unknown  . Codeine Rash and Other (See Comments)    Unknown reaction (patient says it was more serious than just a rash, but he can't remember what happened)    Patient Measurements: Height: 5' 11"  (180.3 cm) Weight: 211 lb (95.709 kg) IBW/kg (Calculated) : 75.3 Heparin Dosing Weight: 95 kg  Vital Signs: Temp: 97.7 F (36.5 C) (02/03 1758) Temp Source: Oral (02/03 1758) BP: 130/93 mmHg (02/03 1759) Pulse Rate: 104 (02/03 1759)  Labs:  Recent Labs  10/12/15 1920  HGB 11.2*  HCT 32.5*  PLT 267    CrCl cannot be calculated (Patient has no serum creatinine result on file.).   Medical History: Past Medical History  Diagnosis Date  . Diabetes mellitus without complication   . Sickle cell-thalassemia disease     a. Sickle cell trait.  . Pulmonary embolism 07/2011    a. Tx with Coumadin for 6 months (unknown cause per patient).  . Hyperkalemia 07/2011  . Acute renal failure 07/2011  . Monoclonal gammopathy   . CKD (chronic kidney disease)   . Sleep apnea     a. uses CPAP.  [name] Hypertension   . Sickle cell-thalassemia disease   . Gout   . Pulmonary embolism 07/2011; 09/27/2014    a. Bilat PE 07/2011 - unclear cause, tx with 6 months Coumadin.;   . Monoclonal gammopathies   . Anemia   . Type II diabetes mellitus   . Asthma   . OSA on CPAP   . Sickle-cell thalassemia   . CKD (chronic kidney disease), stage IV   . History of recent blood transfusion 10/27/14    2 Units PRBC's    Medications:  Scheduled:  . [START ON 10/13/2015] aspirin EC  81 mg Oral Daily  . [START ON 10/13/2015] escitalopram  10 mg Oral Daily  . insulin aspart  0-5 Units Subcutaneous QHS  . insulin aspart  0-9 Units Subcutaneous TID WC  . insulin detemir  15 Units Subcutaneous QHS  . ipratropium-albuterol   3 mL Nebulization QID  . midodrine  5 mg Oral TID WC  . montelukast  10 mg Oral QHS  . [START ON 10/13/2015] NON FORMULARY 1 Inhaler  1 Inhaler Inhalation Q1200  . pantoprazole  40 mg Oral BID   Infusions:    Assessment: 50 yo male with hx of PE and rect embolic CVAs will be converted from lovenox to heparin per hem/onc recommendation.  Patient also has hx of GI bleeding and renal failure.  Was placed on lovenox at OSH. Per patient, last dose was definitely not today; it was yesterday's afternoon (don't know exact time).  Hgb 11.2 and Plt 267 K on 10/12/15  Goal of Therapy:  Heparin level 0.3-0.5 units/ml Monitor platelets by anticoagulation protocol: Yes   Plan:  - Start heparin drip at 1200 units/hr. No bolus - 8 hr heparin level - daily heparin level and CBC - watch for signs of bleeding closely  Jalina Blowers, Tsz-Yin 10/12/2015,7:28 PM

## 2015-10-12 NOTE — PMR Pre-admission (Signed)
[facility] PMR Admission Coordinator Pre-Admission Assessment  Patient: Isaiah [name] [name] is an 50 y.o., male MRN: [id number] DOB: 1965/10/22 Height:   Weight:    Insurance Information HMO:     PPO: yes     PCP:      IPA:      80/20:      OTHER:  PRIMARY: [employer] of [place]      Policy#: [id number]      Subscriber: pt CM Name: Isaiah [name]      Phone#: [phone]     Fax#: [phone] Pre-Cert#: [id number]      Employer: [employer] approved for 7 days with update due 10/19/15 Benefits:  Phone #: [phone]     Name: 10/11/15 Eff. Date: 11/13/14     Deduct: $100      Out of Pocket Max: $6500      Life Max: none We are considered in network CIR: $1000 deductible  Then covers 80% with $2500 max OOP      SNF: 80% Outpatient: 80%     Co-Pay: 20% Home Health: 80%      Co-Pay: 20% DME: 80%     Co-Pay: 20% Providers: in network  SECONDARY: UMR      Policy#: [id number]   Subscriber: wife CM Name: Isaiah [name]      Phone#: [place]     Fax#: [phone] approved for 7 days with update due 5/57/32 Pre-Cert#: [id number]      Employer: [employer] Benefits:  Phone #: [phone]     Name: 10/12/15 Eff. Date: 09/08/2009     Deduct: none      Out of Pocket Max: [phone]      Life Max: none CIR: $500 copay per admit then covers 80%      SNF: 80% 120 days per year Outpatient: $20 copay per visit     Co-Pay: 24 visits each pt, ot, and slp Home Health: 80%      Co-Pay: 20% no visit limit DME: 80%     Co-Pay: 20% In netowrk  Medicaid Application Date:       Case Manager:  Disability Application Date:       Case Worker:   Emergency Facilities manager Information    Name Relation Home Work Mobile   Isaiah [name] Spouse [phone]  [phone]   Isaiah [name] [phone]        Current Medical History  Patient Admitting Diagnosis: embolic CVA, new ESRD dialysis dependent  History of Present Illness: [place] is a 50 year old male with history of DM type 2,  Sickle cell-thalassemia, B-PE 11/12, OSA, CKD stage V pending dialysis, anemia who was admitted to [facility] in [place] on 09/16/15 with N/V and blood stools with acute on chronic renal failure due to acute pancreatitis. He was treated with supportive care, IV bicarb as well as multiple units PRBC and EGD with severe stage IV erosive esophagitis with gastritis. Noted to have abnormal LFTs and Hep A, Hep B and Hep C panels negative. HD initiated on 01/09 due to poor recover with decrease in UOP and currently ongoing on MWF.   He developed hypoxic respiratory failure requiring BIPAP? and was found to have RUL/RML PE as well as left popliteal DVT. He was also found to have left retrocardiac PNA and treated with IV antibiotics as well as IV heparin.   Elevated troponin's felt to be due to demand ischemia due to PE and was treated with BB and ASA  per cardiology input. IVC filter was placed and post procedure was found to have right hand weakness.   CT head reportedly showed subacute to old strokes. Neurology was consulted for input and EMG showed evidence of profound neuropathy. He had worsening of symptoms with increase in right sided weakness, facial droop speech difficulty on 09/27/15. MRI on 01/21 showed Scattered foci of cortical, cerebellar and deep gray matter infarcts with mild petechial hemorrhage and developing laminar necrosis. On 01/22, he had worsening of aphasia and repeat MRI brain 1/22 showed new Restricted diffusion in left temporal and larger more prominent area in posterior left frontal/ perirolandic area involving motor strip in addition to numerous scattered infarcts in bilateral cerebral and cerebellar hemispheres. MRA without significant stenosis. Echo with positive bubble study and TEE positive for PFO. Hypercoagulation studies reported to be negative. IV heparin was changed to Lovenox due to recurrent embolic strokes on heparin. Swallow  evaluation with moderate oral and mild pharyngeal dysphagia and MBS without aspiration. He was started on puree diet with thin liquids.   Urology consulted for fleshy growth on scrotum.  Patient's medical record from  [facility] in [place], [place] has been reviewed by the rehabilitation admission coordinator and physician. NIH Stroke scale: Glascow Coma Scale:  Past Medical History  Past Medical History  Diagnosis Date  . Diabetes mellitus without complication   . Sickle cell-thalassemia disease     a. Sickle cell trait.  . Pulmonary embolism 07/2011    a. Tx with Coumadin for 6 months (unknown cause per patient).  . Hyperkalemia 07/2011  . Acute renal failure 07/2011  . Monoclonal gammopathy   . CKD (chronic kidney disease)   . Sleep apnea     a. uses CPAP.  [name] Hypertension   . Sickle cell-thalassemia disease   . Gout   . Pulmonary embolism 07/2011; 09/27/2014    a. Bilat PE 07/2011 - unclear cause, tx with 6 months Coumadin.;   . Monoclonal gammopathies   . Anemia   . Type II diabetes mellitus   . Asthma   . OSA on CPAP   . Sickle-cell thalassemia   . CKD (chronic kidney disease), stage IV   . History of recent blood transfusion 10/27/14    2 Units PRBC's    Family History   family history includes Diabetes in his mother; Diabetes type I in his brother; Hypertension in his mother; Kidney disease in his brother; Sickle cell anemia in his brother.  Prior Rehab/Hospitalizations Has the patient had major surgery during 100 days prior to admission? No   no prior rehab needs  Current Medications TBD  Patients Current Diet:  2 gm NA 1800 cal ada pureed diet with thin  liquids. MBS 10/03/15 Mod oral and mild pharyngeal dysphagia sithout aspiration. Oral wekaaness with premature loss and slow mastication. rec pureed with thin liquids , double swallow, and small bites and sips.  Precautions / Restrictions Fall precautions for impulsive  Has the  patient had 2 or more falls or a fall with injury in the past year?No  Prior Activity Level Pt was completely independent pta. And driving . Mudlogger for [employer]  Prior Functional Level Self Care: Did the patient need help bathing, dressing, using the toilet or eating?  Independent  Indoor Mobility: Did the patient need assistance with walking from room to room (with or without device)? Independent  Stairs: Did the patient need assistance with internal or external stairs (with or without device)?  Independent  Functional Cognition: Did the patient need help planning regular tasks such as shopping or remembering to take medications? Independent  Home Assistive Devices / Equipment None  Prior Device Use: Indicate devices/aids used by the patient prior to current illness, exacerbation or injury? None of the above   Prior Functional Level Current Functional Level  Bed Mobility  Independent  Min assist   Transfers  Independent  Mod assist good midline balance. Able to tolerate sit eob without support for 15 mins   Gait  Independent  Side steps along EOB with assist for wt shifts to left to move right LE  Upper body dressing  Independent  Mod assist  Lower Body Dressing  independent  Max assist  Grooming  independent  Min to mod assist  Eating/Drinking  independent  Min assist  Toilet Transfer  independent  Mod to min assist  Bladder Continence   continent  incontinent  Bowel Management  continent  incontinent  Stair Climbing  independent  Not attempted  Communication  independent  Pt able to nod head yes or no and answers questions through that means/expressive aphasia reported  Memory  intact  Not assessed  Cooking/Meal Prep  independent     Housework  independent   Money Management  independent   Driving  yes     Special needs/care consideration   Dialysis new esrd on HD. Last HD treatment 10/10/15        Days T TH,  SAT OP new hemodialysis will need to be arranged at d/c from rehab. Bowel mgmt: incontinent Bladder mgmt: incontinent Diabetic mgmt IDDM pta  Previous Home Environment Living Arrangements: Alone  Lives With: Alone Available Help at Discharge: Available 24 hours/day, Family (pt's Mom, Pamala Hurry, can assist with supervision when his wife) Type of Home: Apartment Home Layout: One level (3rd floor apartment level entry) Home Access: Elevator, Level entry Bathroom Shower/Tub: Chiropodist: Standard Bathroom Accessibility: Yes How Accessible: Accessible via walker Home Care Services: No Additional Comments: wife and son in [place] for son to finish [place]  Discharge Living Setting Plans for Discharge Living Setting: Patient's home, Lives with (comment) (to d/c home in [place] with his wife and son in [place]) Type of Home at Discharge: House Discharge Home Layout: Two level Alternate Level Stairs-Rails: None Alternate Level Stairs-Number of Steps: 8 Discharge Home Access: Level entry Discharge Bathroom Shower/Tub: Walk-in shower Discharge Bathroom Toilet: Standard Discharge Bathroom Accessibility: Yes How Accessible: Accessible via walker Does the patient have any problems obtaining your medications?: No  Social/Family/Support Systems Patient Roles: Spouse, 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 Information: Alicia, wife , [phone] Anticipated Caregiver: wife and pt's Mom, Pamala Hurry from [place]: see above Ability/Limitations of Caregiver: wife works fulltime as Advertising account executive at [employer] for [place] Availability: 24/7 Discharge Plan Discussed with Primary Caregiver: Yes Is Caregiver In Agreement with Plan?: Yes Does Caregiver/Family have Issues with Lodging/Transportation while Pt is in Rehab?: No  Goals/Additional Needs Patient/Family Goal for Rehab:  supervision with PT, OT, and SLP Expected length of stay: ELOS 10-14 days Equipment Needs: New ESRD hemodialyis and needs Outpt Hemodailysis arranged for d/c Pt/Family Agrees to Admission and willing to participate: Yes Program Orientation Provided & Reviewed with Pt/Caregiver Including Roles  & Responsibilities: Yes  Patient Condition: I have reviewed medical records from [facility] and discussed case with their RN CM and SW. Pt will benefit from ongoing  PT, OT, SLP , Rehabilitative Nursing  As well as Rehabilitative Medicine with a team approach to coordinate his care. He will receive 3 hrs per day of therapy as well as assist in managing his overall medical issues, He is currently mod assist with all adls and ambulate 2 feet with mod assist. We will admit pt to acute inpatient rehab today.  Preadmission Screen Completed By:  Cleatrice Burke, 10/12/2015 5:06 PM ______________________________________________________________________   Discussed status with Dr. Naaman Plummer  on  10/12/2015  at  1707 and received telephone approval for admission today.  Admission Coordinator:  Cleatrice Burke, time  [id number] Date  10/12/2015   Assessment/Plan: Diagnosis: embolic cva's, debility after multiple medical 1. Does the need for close, 24 hr/day  Medical supervision in concert with the patient's rehab needs make it unreasonable for this patient to be served in a less intensive setting? Yes 2. Co-Morbidities requiring supervision/potential complications: htn, PE, pneumonia, ESRD, DM2 3. Due to bladder management, bowel management, safety, skin/wound care, disease management, medication administration, pain management and patient education, does the patient require 24 hr/day rehab nursing? Yes 4. Does the patient require coordinated care of a physician, rehab nurse, PT (1-2 hrs/day, 5 days/week), OT (1-2 hrs/day, 5 days/week) and SLP (1-2 hrs/day, 5 days/week) to address physical and functional  deficits in the context of the above medical diagnosis(es)? Yes Addressing deficits in the following areas: balance, endurance, locomotion, strength, transferring, bowel/bladder control, bathing, dressing, feeding, grooming, toileting, cognition, speech, swallowing and psychosocial support 5. Can the patient actively participate in an intensive therapy program of at least 3 hrs of therapy 5 days a week? Yes 6. The potential for patient to make measurable gains while on inpatient rehab is excellent 7. Anticipated functional outcomes upon discharge from inpatients are: modified independent and supervision PT, modified independent and supervision OT, modified independent and supervision SLP 8. Estimated rehab length of stay to reach the above functional goals is: 11-16 days 9. Does the patient have adequate social supports to accommodate these discharge functional goals? Yes 10. Anticipated D/C setting: Home 11. Anticipated post D/C treatments: HH therapy and Outpatient therapy 12. Overall Rehab/Functional Prognosis: excellent    RECOMMENDATIONS: This patient's condition is appropriate for continued rehabilitative care in the following setting: CIR Patient has agreed to participate in recommended program. Yes Note that insurance prior authorization may be required for reimbursement for recommended care.  Comment: Admit to inpatient rehab today.   Meredith Staggers, MD, [facility] 10/12/2015   Cleatrice Burke 10/12/2015

## 2015-10-12 NOTE — Progress Notes (Signed)
Secondary Market PMR Admission Coordinator Pre-Admission Assessment  Patient: [name] is an 50 y.o., male MRN: [id number] DOB: 1965-12-05 Height:   Weight:    Insurance Information HMO: PPO: yes PCP: IPA: 80/20: OTHER:  PRIMARY: [employer] [place] Policy#: [id number] M Subscriber: pt CM Name: Luberta Mutter Phone#: [phone] Fax#: [phone] Pre-Cert#: [id number] Employer: [employer] approved for 7 days with update due 10/19/15 Benefits: Phone #: [phone] Name: 10/11/15 Eff. Date: 11/13/14 Deduct: $100 Out of Pocket Max: $6500 Life Max: none We are considered in network CIR: $1000 deductible Then covers 80% with $2500 max OOP SNF: 80% Outpatient: 80% Co-Pay: 20% Home Health: 80% Co-Pay: 20% DME: 80% Co-Pay: 20% Providers: in network  SECONDARY: UMR Policy#: [id number] Subscriber: wife CM Name: Lattie Haw Phone#: [place] Fax#: [phone] approved for 7 days with update due 2/70/35 Pre-Cert#: [id number] Employer: [employer] Benefits: Phone #: [phone] Name: 10/12/15 Eff. Date: 09/08/2009 Deduct: none Out of Pocket Max: [phone] Life Max: none CIR: $500 copay per admit then covers 80% SNF: 80% 120 days per year Outpatient: $20 copay per visit Co-Pay: 24 visits each pt, ot, and slp Home Health: 80% Co-Pay: 20% no visit limit DME: 80% Co-Pay: 20% In netowrk  Medicaid Application Date: Case Manager:  Disability Application Date: Case Worker:   Emergency Facilities manager Information    Name Relation Home Work Mobile   [place] Spouse [phone]  [phone]   Jurell, Basista [phone]        Current Medical History  Patient Admitting Diagnosis: embolic CVA, new ESRD dialysis dependent  History of Present Illness: Isaiah [name] is a 50 year old male with  history of DM type 2, Sickle cell-thalassemia, B-PE 11/12, OSA, CKD stage V pending dialysis, anemia who was admitted to [facility] in [place] on 09/16/15 with N/V and blood stools with acute on chronic renal failure due to acute pancreatitis. He was treated with supportive care, IV bicarb as well as multiple units PRBC and EGD with severe stage IV erosive esophagitis with gastritis. Noted to have abnormal LFTs and Hep A, Hep B and Hep C panels negative. HD initiated on 01/09 due to poor recover with decrease in UOP and currently ongoing on MWF.   He developed hypoxic respiratory failure requiring BIPAP? and was found to have RUL/RML PE as well as left popliteal DVT. He was also found to have left retrocardiac PNA and treated with IV antibiotics as well as IV heparin.   Elevated troponin's felt to be due to demand ischemia due to PE and was treated with BB and ASA per cardiology input. IVC filter was placed and post procedure was found to have right hand weakness.   CT head reportedly showed subacute to old strokes. Neurology was consulted for input and EMG showed evidence of profound neuropathy. He had worsening of symptoms with increase in right sided weakness, facial droop speech difficulty on 09/27/15. MRI on 01/21 showed Scattered foci of cortical, cerebellar and deep gray matter infarcts with mild petechial hemorrhage and developing laminar necrosis. On 01/22, he had worsening of aphasia and repeat MRI brain 1/22 showed new Restricted diffusion in left temporal and larger more prominent area in posterior left frontal/ perirolandic area involving motor strip in addition to numerous scattered infarcts in bilateral cerebral and cerebellar hemispheres. MRA without significant stenosis. Echo with positive bubble study and TEE positive for PFO. Hypercoagulation studies reported to be negative. IV heparin was changed to Lovenox due to recurrent embolic strokes on  heparin. Swallow evaluation with moderate oral and mild pharyngeal dysphagia and MBS without aspiration. He was started on puree diet with thin liquids.   Urology consulted for fleshy growth on scrotum.  Patient's medical record from [facility] in [place], [place] has been reviewed by the rehabilitation admission coordinator and physician. NIH Stroke scale: Glascow Coma Scale:  Past Medical History  Past Medical History  Diagnosis Date  . Diabetes mellitus without complication   . Sickle cell-thalassemia disease     a. Sickle cell trait.  . Pulmonary embolism 07/2011    a. Tx with Coumadin for 6 months (unknown cause per patient).  . Hyperkalemia 07/2011  . Acute renal failure 07/2011  . Monoclonal gammopathy   . CKD (chronic kidney disease)   . Sleep apnea     a. uses CPAP.  [name] Hypertension   . Sickle cell-thalassemia disease   . Gout   . Pulmonary embolism 07/2011; 09/27/2014    a. Bilat PE 07/2011 - unclear cause, tx with 6 months Coumadin.;   . Monoclonal gammopathies   . Anemia   . Type II diabetes mellitus   . Asthma   . OSA on CPAP   . Sickle-cell thalassemia   . CKD (chronic kidney disease), stage IV   . History of recent blood transfusion 10/27/14    2 Units PRBC's    Family History  family history includes Diabetes in his mother; Diabetes type I in his brother; Hypertension in his mother; Kidney disease in his brother; Sickle cell anemia in his brother.  Prior Rehab/Hospitalizations Has the patient had major surgery during 100 days prior to admission? No no prior rehab needs  Current Medications TBD  Patients Current Diet: 2 gm NA 1800 cal ada pureed diet with thin liquids. MBS 10/03/15 Mod oral and mild pharyngeal dysphagia sithout aspiration. Oral wekaaness with premature loss and slow mastication. rec pureed with thin liquids , double swallow, and small  bites and sips.  Precautions / Restrictions Fall precautions for impulsive  Has the patient had 2 or more falls or a fall with injury in the past year?No  Prior Activity Level Pt was completely independent pta. And driving . Mudlogger for [employer]  Prior Functional Level Self Care: Did the patient need help bathing, dressing, using the toilet or eating? Independent  Indoor Mobility: Did the patient need assistance with walking from room to room (with or without device)? Independent  Stairs: Did the patient need assistance with internal or external stairs (with or without device)? Independent  Functional Cognition: Did the patient need help planning regular tasks such as shopping or remembering to take medications? Independent  Home Assistive Devices / Equipment None  Prior Device Use: Indicate devices/aids used by the patient prior to current illness, exacerbation or injury? None of the above   Prior Functional Level Current Functional Level  Bed Mobility  Independent  Min assist   Transfers  Independent  Mod assist good midline balance. Able to tolerate sit eob without support for 15 mins   Gait  Independent  Side steps along EOB with assist for wt shifts to left to move right LE  Upper body dressing  Independent  Mod assist  Lower Body Dressing  independent  Max assist  Grooming  independent  Min to mod assist  Eating/Drinking  independent  Min assist  Toilet Transfer  independent  Mod to min assist  Bladder Continence   continent  incontinent  Bowel Management  continent  incontinent  Stair Climbing  independent  Not attempted  Communication  independent  Pt able to nod head yes or no and answers questions through that means/expressive aphasia reported  Memory  intact  Not assessed  Cooking/Meal Prep  independent    Housework  independent   Money Management   independent   Driving  yes     Special needs/care consideration   Dialysis new esrd on HD. Last HD treatment 10/10/15 Days T TH, SAT OP new hemodialysis will need to be arranged at d/c from rehab. Bowel mgmt: incontinent Bladder mgmt: incontinent Diabetic mgmt IDDM pta  Previous Home Environment Living Arrangements: Alone Lives With: Alone Available Help at Discharge: Available 24 hours/day, Family (pt's Mom, Pamala Hurry, can assist with supervision when his wife) Type of Home: Apartment Home Layout: One level (3rd floor apartment level entry) Home Access: Elevator, Level entry Bathroom Shower/Tub: Chiropodist: Standard Bathroom Accessibility: Yes How Accessible: Accessible via walker Home Care Services: No Additional Comments: wife and son in [place] for son to finish [place]  Discharge Living Setting Plans for Discharge Living Setting: Patient's home, Lives with (comment) (to d/c home in [place] with his wife and son in [place]) Type of Home at Discharge: House Discharge Home Layout: Two level Alternate Level Stairs-Rails: None Alternate Level Stairs-Number of Steps: 8 Discharge Home Access: Level entry Discharge Bathroom Shower/Tub: Walk-in shower Discharge Bathroom Toilet: Standard Discharge Bathroom Accessibility: Yes How Accessible: Accessible via walker Does the patient have any problems obtaining your medications?: No  Social/Family/Support Systems Patient Roles: Spouse, 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 Information: [place], wife , [phone] Anticipated Caregiver: wife and pt's Mom, Pamala Hurry from [place]: see above Ability/Limitations of Caregiver: wife works fulltime as Advertising account executive at [employer] for [employer] Availability: 24/7 Discharge Plan Discussed with Primary Caregiver: Yes Is Caregiver In Agreement with Plan?:  Yes Does Caregiver/Family have Issues with Lodging/Transportation while Pt is in Rehab?: No  Goals/Additional Needs Patient/Family Goal for Rehab: supervision with PT, OT, and SLP Expected length of stay: ELOS 10-14 days Equipment Needs: New ESRD hemodialyis and needs Outpt Hemodailysis arranged for d/c Pt/Family Agrees to Admission and willing to participate: Yes Program Orientation Provided & Reviewed with Pt/Caregiver Including Roles & Responsibilities: Yes  Patient Condition: I have reviewed medical records from [facility] and discussed case with their RN CM and SW. Pt will benefit from ongoing PT, OT, SLP , Rehabilitative Nursing As well as Rehabilitative Medicine with a team approach to coordinate his care. He will receive 3 hrs per day of therapy as well as assist in managing his overall medical issues, He is currently mod assist with all adls and ambulate 2 feet with mod assist. We will admit pt to acute inpatient rehab today.  Preadmission Screen Completed By: Cleatrice Burke, 10/12/2015 5:06 PM ______________________________________________________________________  Discussed status with Dr. Naaman Plummer on 10/12/2015 at 1707 and received telephone approval for admission today.  Admission Coordinator: Cleatrice Burke, time [id number] Date 10/12/2015   Assessment/Plan: Diagnosis: embolic cva's, debility after multiple medical 1. Does the need for close, 24 hr/day Medical supervision in concert with the patient's rehab needs make it unreasonable for this patient to be served in a less intensive setting? Yes 2. Co-Morbidities requiring supervision/potential complications: htn, PE, pneumonia, ESRD, DM2 3. Due to bladder management, bowel management, safety, skin/wound care, disease management, medication administration, pain management and patient education, does  the patient require 24 hr/day rehab nursing? Yes 4. Does the patient require coordinated care of a  physician, rehab nurse, PT (1-2 hrs/day, 5 days/week), OT (1-2 hrs/day, 5 days/week) and SLP (1-2 hrs/day, 5 days/week) to address physical and functional deficits in the context of the above medical diagnosis(es)? Yes Addressing deficits in the following areas: balance, endurance, locomotion, strength, transferring, bowel/bladder control, bathing, dressing, feeding, grooming, toileting, cognition, speech, swallowing and psychosocial support 5. Can the patient actively participate in an intensive therapy program of at least 3 hrs of therapy 5 days a week? Yes 6. The potential for patient to make measurable gains while on inpatient rehab is excellent 7. Anticipated functional outcomes upon discharge from inpatients are: modified independent and supervision PT, modified independent and supervision OT, modified independent and supervision SLP 8. Estimated rehab length of stay to reach the above functional goals is: 11-16 days 9. Does the patient have adequate social supports to accommodate these discharge functional goals? Yes 10. Anticipated D/C setting: Home 11. Anticipated post D/C treatments: HH therapy and Outpatient therapy 12. Overall Rehab/Functional Prognosis: excellent    RECOMMENDATIONS: This patient's condition is appropriate for continued rehabilitative care in the following setting: CIR Patient has agreed to participate in recommended program. Yes Note that insurance prior authorization may be required for reimbursement for recommended care.  Comment: Admit to inpatient rehab today.   Meredith Staggers, MD, [facility] 10/12/2015   Cleatrice Burke 10/12/2015      Cosigned by: Meredith Staggers, MD at 10/12/2015 5:15 PM  Revision History     Date/Time User Provider Type Action   10/12/2015 5:15 PM Meredith Staggers, MD Physician Cosign   10/12/2015 5:14 PM Meredith Staggers, MD Physician Sign   10/12/2015 5:07 PM Cristina Gong, RN Rehab Admission Coordinator Share   View Details Report

## 2015-10-12 NOTE — H&P (Signed)
Physical Medicine and Rehabilitation Admission H&P    CC: Left sided weakness, difficulty with swallowing and speech difficulties   HPI:  [facility] is a 50 year old RH-male with history of DM type 2 with diabetic, Sickle cell-thalassemia, B-PE 11/12, OSA, CKD stage V pending dialysis, anemia who was admitted to [facility] in [place] on 09/16/15 with N/V and blood stools with acute on chronic renal failure due to acute pancreatitis. He was treated with supportive care, IV bicarb as well as multiple units PRBC and EGD with severe stage IV erosive esophagitis with gastritis.  Noted to have abnormal LFTs and Hep A, Hep B and Hep C panels negative. HD initiated on 01/09 due to poor recover with decrease in UOP and currently ongoing on MWF.  He developed hypoxic respiratory failure requiring BIPAP? and was found to have RUL/RML PE as well as left popliteal DVT. He was also found to have left retrocardiac PNA and treated with IV antibiotics as well as IV heparin.   Elevated troponin's felt to be due to demand ischemia due to PE and was treated with BB and ASA per cardiology input.  IVC filter was placed and post procedure was found to have right hand weakness.   CT head reportedly showed subacute to old strokes. Neurology was consulted for input and EMG showed evidence of profound neuropathy. He had worsening of symptoms with increase in right sided weakness, facial droop speech difficulty on 09/27/15. MRI on 01/21 showed  Scattered foci of cortical, cerebellar and deep gray matter infarcts with mild petechial hemorrhage and developing laminar necrosis.   On 01/22, he had worsening of aphasia and repeat MRI brain 1/22 showed new  Restricted diffusion in left temporal and larger more prominent area in posterior left frontal/ perirolandic area involving motor strip in addition to numerous scattered infarcts in bilateral cerebral and cerebellar hemispheres. MRA without  significant stenosis. Echo with positive bubble study and TEE positive for PFO. Hypercoagulation studies reported to be negative. IV heparin was changed to Lovenox due to recurrent embolic strokes on heparin. Swallow evaluation with moderate oral and mild pharyngeal dysphagia and MBS without aspiration. He was started on puree diet with thin liquids.  Therapy ongoing and patient making gains. CIR was recommended for follow up therapy and chart/notes reviewed by Rehab MD and Rehab CM. He was deemed to be appropriate for CIR and cleared for admission today.    Review of Systems  Unable to perform ROS: language  Eyes: Positive for blurred vision (at times? Question new?) and double vision.  Respiratory: Negative for cough.   Cardiovascular: Negative for chest pain.  Gastrointestinal: Negative for heartburn and nausea.  Genitourinary: Negative for dysuria.  Musculoskeletal: Negative for myalgias, back pain and joint pain.  Neurological: Positive for dizziness, speech change and focal weakness. Negative for headaches.    Past Medical History  Diagnosis Date  . Diabetes mellitus without complication   . Sickle cell-thalassemia disease     a. Sickle cell trait.  . Pulmonary embolism 07/2011    a. Tx with Coumadin for 6 months (unknown cause per patient).  . Hyperkalemia 07/2011  . Acute renal failure 07/2011  . Monoclonal gammopathy   . CKD (chronic kidney disease)   . Sleep apnea     a. uses CPAP.  [name] Hypertension   . Sickle cell-thalassemia disease   . Gout   . Pulmonary embolism 07/2011; 09/27/2014    a. Bilat PE 07/2011 - unclear  cause, tx with 6 months Coumadin.;   . Monoclonal gammopathies   . Anemia   . Type II diabetes mellitus   . Asthma   . OSA on CPAP   . Sickle-cell thalassemia   . CKD (chronic kidney disease), stage IV   . History of recent blood transfusion 10/27/14    2 Units PRBC's    Past Surgical History  Procedure Laterality Date  . Cholecystectomy    . Bone  marrow biopsy    . Other surgical history      Retinal surgery  . Pars plana vitrectomy  02/17/2012    Procedure: PARS PLANA VITRECTOMY WITH 25 GAUGE;  Surgeon: Hayden Pedro, MD;  Location: [facility];  Service: Ophthalmology;  Laterality: Right;  . Cholecystectomy  1990's?  . Eye surgery Right      Family History  Problem Relation Age of Onset  . Hypertension Mother   . Diabetes Mother   . Sickle cell anemia Brother   . Diabetes type I Brother   . 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  Independent PTA. Per reports that he has never smoked. He has never used smokeless tobacco. Per reports that he does not drink alcohol or use illicit drugs.    Allergies  Allergen Reactions  . Codeine Other (See Comments)    Pt unknown  . Codeine Rash and Other (See Comments)    Unknown reaction (patient says it was more serious than just a rash, but he can't remember what happened)    (Not in a hospital admission)  Home: 2 level home with 8 stairs.   Functional History: Independent and working PTA.   Functional Status:  Mobility:  Moderate assist for transfers with right lean and flexed posture  Mod assist 50-74 % for for ambulating 2- 10 feet for pivoting steps to chair     ADL:  Able to tolerate sitting at EOB for 15 minutes without support for ADLS Able to wash face with supervision. Working on ROM of RUE.   Cognition: Able to nod appropriately for basic Y/N questions.    There were no vitals taken for this visit. Physical Exam  Nursing note and vitals reviewed. Constitutional: He appears well-developed and well-nourished.  Flat affect and fatigued appearing due to long ambulance ride from [place]. Diatek cath right chest wall and bulky dressing right neck--question recently removed IJ. Patient wet--incontinent of bowel and bladder at admission and was  able to indicate need for change.   HENT:  Head: Normocephalic and atraumatic.  Right Ear: External ear normal.  Left Ear: External ear normal.  Eyes: Conjunctivae are normal. Pupils are equal, round, and reactive to light. Right eye exhibits no discharge. Left eye exhibits no discharge. No scleral icterus.  Neck: Normal range of motion. Neck supple. No tracheal deviation present. No thyromegaly present.  Cardiovascular: Normal rate and regular rhythm.   No murmur heard. Respiratory: Effort normal and breath sounds normal. No respiratory distress. He has no wheezes. He has no rales.  GI: Soft. Bowel sounds are normal. He exhibits no distension. There is no tenderness. There is no rebound.  Genitourinary:  Scrotum with two skin tags.   Musculoskeletal: He exhibits no edema or tenderness.  Neurological: He is alert.  Makes eye contact. Right facial weakness with moderate to severe dysarthria/decreased tongue control.  Verbal output limited to "uh huh" and "yes".  He was able to state his name. Right inattention with evidence of R-HH. Able to follow simple motor commands and point to basic items with occasional perseverative behaviors. Expressive greater than receptive aphasia with apraxia.  Nodded appropriately to basic Y/N biographic information. Sensation appears intact.  RUE 2- to 2/5 delt,bicep, tricep, 1 to 1+ wrist/hand. RLE: 2+ to 3/5 HF, KE, ADF/APF. Senses gross pain on right. Difficult to assess light touch. LUE and LLE 4 to 5/5. Toes up on right. DTR's 3+ on right 2+ on left.   Skin: Skin is warm and dry.  Psychiatric:  Pleasant, cooperative.     Laboratory  01/23: NA-136  K- 4.5  CL-100  CO2-23  BUN- 88 Cr- 7.4  Ca- 8.1   Hgb: 11.3   Hct: 32.5  WBC: 22.6  Plt: 177 Lipase 1112     Medical Problem List and Plan: 1.  Right hemiparesis and aphasia secondary to bi-cerebral embolic infarcts (L>R) 2.  PE/DVT/Stoke/Anticoagulation: Pharmaceutical: Lovenox bid per records from  [place]. Pharmacy recommending treatment dose Heparin due to bleeding risk/concerns of lovenox use in ESRD. Will place order for Hem/Onc Consult for input.  Updated wife on risks/concerns expressed by pharmacy and that we will use iv heparin gtt at this time.  3. Pain Management: Ultram prn.  4. Mood: LCSW to follow for evaluation and support.  5. Neuropsych: This patient is not fully capable of making decisions on his own behalf. 6. Skin/Wound Care: Routine pressure relief measures.  7. Fluids/Electrolytes/Nutrition: Renal restrictions. 1200 cc/FR.  8. DM type 2:  Monitor BS ac/hs. Continue levemir and use SSI for elevated BS. 9. HTN: Monitor BP bid and titrate medications as needed. Elevated at admission. Will set parameters on Midodrine.  10. ESRD on HD: per [facility]. HD tomorrow after therapies. Continue diet/volume mgt      Post Admission Physician Evaluation: 1. Functional deficits secondary  to embolic CVA. 2. Patient is admitted to receive collaborative, interdisciplinary care between the physiatrist, rehab nursing staff, and therapy team. 3. Patient's level of medical complexity and substantial therapy needs in context of that medical necessity cannot be provided at a lesser intensity of care such as a SNF. 4. Patient has experienced substantial functional loss from his/her baseline which was documented above under the "Functional History" and "Functional Status" headings.  Judging by the patient's diagnosis, physical exam, and functional history, the patient has potential for functional progress which will result in measurable gains while on inpatient rehab.  These gains will be of substantial and practical use upon discharge  in facilitating mobility and self-care at the household level. 5. Physiatrist will provide 24 hour management of medical needs as well as oversight of the therapy plan/treatment and provide guidance as appropriate regarding the interaction of the two. 6. 24 hour  rehab nursing will assist with bladder management, bowel management, safety, skin/wound care, disease management, medication administration, pain management and patient education  and help integrate therapy concepts, techniques,education, etc. 7. PT will assess and treat for/with: Lower extremity strength, range of motion, stamina, balance, functional mobility, safety, adaptive techniques and equipment, NMR, visual-spatial awareness, cognitive pereceptual rx, family education, ego support.   Goals are: supervision to min assist. 8. OT will assess and treat for/with: ADL's, functional mobility, safety, upper extremity strength, adaptive techniques and equipment, NMR, visual-spatial and cognitive perceptual awareness, community reintegration.   Goals are: supervision to min assist. Therapy may not yet proceed with showering this patient. 9. SLP will assess and treat for/with: cognition,  language, swallowing, education.  Goals are: supervision to mod assist. 10. Case Management and Social Worker will assess and treat for psychological issues and discharge planning. 11. Team conference will be held weekly to assess progress toward goals and to determine barriers to discharge. 12. Patient will receive at least 3 hours of therapy per day at least 5 days per week. 13. ELOS: 17-22 days       14. Prognosis:  excellent     Meredith Staggers, MD, [place] [facility] 10/12/2015   10/12/2015

## 2015-10-12 NOTE — Progress Notes (Signed)
Bld. Glucose 420, MD notified with new orders. Heparin drip started as ordered. Order clarified with MD.

## 2015-10-12 NOTE — Progress Notes (Addendum)
50 yo male with hx of MGUS, myeloproloferative disorder,PE,  GI bleed,CKD 4-5 and recent embolic CVAs who was placed on enoxaparin at outside hospital and transferred by car to [facility].   Enoxaparin not indicated in setting of renal failure. Discussed case with Dr Alen Blew, on call Heme/Onc and who has followed pt in clinic for >1 yr.   Recommendation for near term is IV heparin.Will ask for pharmacy consult, given hx of GI bleed as well as some residual effect of enoxaparin would not bolus  Dr Alen Blew will see pt next wk on 4W to evaluate long term anticoagulation options.

## 2015-10-13 ENCOUNTER — Inpatient Hospital Stay (HOSPITAL_COMMUNITY): Payer: Self-pay | Admitting: Physical Therapy

## 2015-10-13 ENCOUNTER — Inpatient Hospital Stay (HOSPITAL_COMMUNITY): Payer: BLUE CROSS/BLUE SHIELD

## 2015-10-13 ENCOUNTER — Inpatient Hospital Stay (HOSPITAL_COMMUNITY): Payer: BLUE CROSS/BLUE SHIELD | Admitting: Speech Pathology

## 2015-10-13 DIAGNOSIS — G8191 Hemiplegia, unspecified affecting right dominant side: Secondary | ICD-10-CM

## 2015-10-13 LAB — CBC
HCT: 31 % — ABNORMAL LOW (ref 39.0–52.0)
Hemoglobin: 10.6 g/dL — ABNORMAL LOW (ref 13.0–17.0)
MCH: 29.4 pg (ref 26.0–34.0)
MCHC: 34.2 g/dL (ref 30.0–36.0)
MCV: 85.9 fL (ref 78.0–100.0)
Platelets: 285 10*3/uL (ref 150–400)
RBC: 3.61 MIL/uL — ABNORMAL LOW (ref 4.22–5.81)
RDW: 18.7 % — ABNORMAL HIGH (ref 11.5–15.5)
WBC: 15.5 10*3/uL — ABNORMAL HIGH (ref 4.0–10.5)

## 2015-10-13 LAB — RENAL FUNCTION PANEL
Albumin: 2.7 g/dL — ABNORMAL LOW (ref 3.5–5.0)
Anion gap: 12 (ref 5–15)
BUN: 52 mg/dL — ABNORMAL HIGH (ref 6–20)
CO2: 23 mmol/L (ref 22–32)
Calcium: 9.1 mg/dL (ref 8.9–10.3)
Chloride: 94 mmol/L — ABNORMAL LOW (ref 101–111)
Creatinine, Ser: 6.42 mg/dL — ABNORMAL HIGH (ref 0.61–1.24)
GFR calc Af Amer: 10 mL/min — ABNORMAL LOW (ref >60)
GFR calc non Af Amer: 9 mL/min — ABNORMAL LOW (ref >60)
Glucose, Bld: 426 mg/dL — ABNORMAL HIGH (ref 65–99)
Phosphorus: 3 mg/dL (ref 2.5–4.6)
Potassium: 4.7 mmol/L (ref 3.5–5.1)
Sodium: 129 mmol/L — ABNORMAL LOW (ref 135–145)

## 2015-10-13 LAB — GLUCOSE, CAPILLARY
Glucose-Capillary: 235 mg/dL — ABNORMAL HIGH (ref 65–99)
Glucose-Capillary: 360 mg/dL — ABNORMAL HIGH (ref 65–99)
Glucose-Capillary: 134 mg/dL — ABNORMAL HIGH (ref 65–99)
Glucose-Capillary: 205 mg/dL — ABNORMAL HIGH (ref 65–99)

## 2015-10-13 LAB — HEPARIN LEVEL (UNFRACTIONATED)
Heparin Unfractionated: 0.37 IU/mL (ref 0.30–0.70)
Heparin Unfractionated: 0.18 IU/mL — ABNORMAL LOW (ref 0.30–0.70)
Heparin Unfractionated: 0.64 IU/mL (ref 0.30–0.70)

## 2015-10-13 MED ORDER — ALTEPLASE 2 MG IJ SOLR
2.0000 mg | Freq: Once | INTRAMUSCULAR | Status: DC | PRN
  Filled 2015-10-13: qty 2

## 2015-10-13 MED ORDER — LIDOCAINE HCL (PF) 1 % IJ SOLN
5.0000 mL | INTRAMUSCULAR | Status: DC | PRN
  Filled 2015-10-13: qty 5

## 2015-10-13 MED ORDER — HEPARIN SODIUM (PORCINE) 1000 UNIT/ML DIALYSIS
1000.0000 [IU] | INTRAMUSCULAR | Status: DC | PRN
  Filled 2015-10-13: qty 1

## 2015-10-13 MED ORDER — PENTAFLUOROPROP-TETRAFLUOROETH EX AERO: 1.0000 "application " | INHALATION_SPRAY | CUTANEOUS | Status: DC | PRN

## 2015-10-13 MED ORDER — SODIUM CHLORIDE 0.9 % IV SOLN: 100.0000 mL | INTRAVENOUS | Status: DC | PRN

## 2015-10-13 MED ORDER — CETYLPYRIDINIUM CHLORIDE 0.05 % MT LIQD
7.0000 mL | Freq: Two times a day (BID) | OROMUCOSAL | Status: DC
  Administered 2015-10-13 – 2015-11-01 (×36): 7 mL via OROMUCOSAL

## 2015-10-13 MED ORDER — INFLUENZA VAC SPLIT QUAD 0.5 ML IM SUSY
0.5000 mL | PREFILLED_SYRINGE | Freq: Once | INTRAMUSCULAR | Status: AC
  Administered 2015-10-13: 0.5 mL via INTRAMUSCULAR

## 2015-10-13 MED ORDER — LIDOCAINE-PRILOCAINE 2.5-2.5 % EX CREA
1.0000 "application " | TOPICAL_CREAM | CUTANEOUS | Status: DC | PRN
  Filled 2015-10-13: qty 5

## 2015-10-13 MED ORDER — FLUTICASONE FUROATE-VILANTEROL 200-25 MCG/INH IN AEPB
1.0000 | INHALATION_SPRAY | Freq: Every day | RESPIRATORY_TRACT | Status: DC
  Administered 2015-10-14 – 2015-10-20 (×3): 1 via RESPIRATORY_TRACT
  Administered 2015-10-21: 200 ug via RESPIRATORY_TRACT
  Administered 2015-10-22 – 2015-11-01 (×9): 1 via RESPIRATORY_TRACT
  Filled 2015-10-13 (×3): qty 28

## 2015-10-13 NOTE — Procedures (Signed)
Patient seen on Hemodialysis. QB 350, UF goal 2.5L Treatment adjusted as needed.  Elmarie Shiley MD [facility] Office # [phone] Pager # [phone] 5:43 PM

## 2015-10-13 NOTE — Interval H&P Note (Signed)
[place] was admitted today to Inpatient Rehabilitation with the diagnosis of Right hemiparesis due to embolic CVA.  The patient's history has been reviewed, patient examined, and there is no change in status.  Patient continues to be appropriate for intensive inpatient rehabilitation.  I have reviewed the patient's chart and labs.  Questions were answered to the patient's satisfaction. The PAPE has been reviewed and assessment remains appropriate.  KIRSTEINS,ANDREW E 10/13/2015, 11:00 AM

## 2015-10-13 NOTE — H&P (View-Only) (Signed)
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 He was treated with supportive care, IV bicarb as well as multiple units PRBC and EGD with severe stage IV erosive esophagitis with gastritis.  Noted to have abnormal LFTs and Hep A, Hep B and Hep C panels negative. HD initiated on 01/09 due to poor recover with decrease in UOP and currently ongoing on MWF.  He developed hypoxic respiratory failure requiring BIPAP? and was found to have RUL/RML PE as well as left popliteal DVT. He was also found to have left retrocardiac PNA and treated with IV antibiotics as well as IV heparin.   Elevated troponin's felt to be due to demand ischemia due to PE and was treated with BB and ASA per cardiology input.  IVC filter was placed and post procedure was found to have right hand weakness.   CT head reportedly showed subacute to old strokes. Neurology was consulted for input and EMG showed evidence of profound neuropathy. He had worsening of symptoms with increase in right sided weakness, facial droop speech difficulty on 09/27/15. MRI on 01/21 showed  Scattered foci of cortical, cerebellar and deep gray matter infarcts with mild petechial hemorrhage and developing laminar necrosis.   On 01/22, he had worsening of aphasia and repeat MRI brain 1/22 showed new  Restricted diffusion in left temporal and larger more prominent area in posterior left frontal/ perirolandic area involving motor strip in addition to numerous scattered infarcts in bilateral cerebral and cerebellar hemispheres. MRA without  significant stenosis. Echo with positive bubble study and TEE positive for PFO. Hypercoagulation studies reported to be negative. IV heparin was changed to Lovenox due to recurrent embolic strokes on heparin. Swallow evaluation with moderate oral and mild pharyngeal dysphagia and MBS without aspiration. He was started on puree diet with thin liquids.  Therapy ongoing and patient making gains. CIR was recommended for follow up therapy and chart/notes reviewed by Rehab MD and Rehab CM. He was deemed to be appropriate for CIR and cleared for admission today.    Review of Systems  Unable to perform ROS: language  Eyes: Positive for blurred vision (at times? Question new?) and double vision.  Respiratory: Negative for cough.   Cardiovascular: Negative for chest pain.  Gastrointestinal: Negative for heartburn and nausea.  Genitourinary: Negative for dysuria.  Musculoskeletal: Negative for myalgias, back pain and joint pain.  Neurological: Positive for dizziness, speech change and focal weakness. Negative for headaches.    Past Medical History  Diagnosis Date  . Diabetes mellitus without complication   . Sickle cell-thalassemia disease     a. Sickle cell trait.  . Pulmonary embolism 07/2011    a. Tx with Coumadin for 6 months (unknown cause per patient).  . Hyperkalemia 07/2011  . Acute renal failure 07/2011  . Monoclonal gammopathy   . CKD (chronic kidney disease)   . Sleep apnea     a. uses CPAP.  [name] Hypertension   . Sickle cell-thalassemia disease   . Gout   . Pulmonary embolism 07/2011; 09/27/2014    a. Bilat PE 07/2011 - unclear  cause, tx with 6 months Coumadin.;   . Monoclonal gammopathies   . Anemia   . Type II diabetes mellitus   . Asthma   . OSA on CPAP   . Sickle-cell thalassemia   . CKD (chronic kidney disease), stage IV   . History of recent blood transfusion 10/27/14    2 Units PRBC's    Past Surgical History  Procedure Laterality Date  . Cholecystectomy    . Bone  marrow biopsy    . Other surgical history      Retinal surgery  . Pars plana vitrectomy  02/17/2012    Procedure: PARS PLANA VITRECTOMY WITH 25 GAUGE;  Surgeon: Hayden Pedro, MD;  Location: [facility];  Service: Ophthalmology;  Laterality: Right;  . Cholecystectomy  1990's?  . Eye surgery Right      Family History  Problem Relation Age of Onset  . Hypertension Mother   . Diabetes Mother   . Sickle cell anemia Brother   . Diabetes type I Brother   . 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  Independent PTA. Per reports that he has never smoked. He has never used smokeless tobacco. Per reports that he does not drink alcohol or use illicit drugs.    Allergies  Allergen Reactions  . Codeine Other (See Comments)    Pt unknown  . Codeine Rash and Other (See Comments)    Unknown reaction (patient says it was more serious than just a rash, but he can't remember what happened)    (Not in a hospital admission)  Home: 2 level home with 8 stairs.   Functional History: Independent and working PTA.   Functional Status:  Mobility:  Moderate assist for transfers with right lean and flexed posture  Mod assist 50-74 % for for ambulating 2- 10 feet for pivoting steps to chair     ADL:  Able to tolerate sitting at EOB for 15 minutes without support for ADLS Able to wash face with supervision. Working on ROM of RUE.   Cognition: Able to nod appropriately for basic Y/N questions.    There were no vitals taken for this visit. Physical Exam  Nursing note and vitals reviewed. Constitutional: He appears well-developed and well-nourished.  Flat affect and fatigued appearing due to long ambulance ride from [place]. Diatek cath right chest wall and bulky dressing right neck--question recently removed IJ. Patient wet--incontinent of bowel and bladder at admission and was  able to indicate need for change.   HENT:  Head: Normocephalic and atraumatic.  Right Ear: External ear normal.  Left Ear: External ear normal.  Eyes: Conjunctivae are normal. Pupils are equal, round, and reactive to light. Right eye exhibits no discharge. Left eye exhibits no discharge. No scleral icterus.  Neck: Normal range of motion. Neck supple. No tracheal deviation present. No thyromegaly present.  Cardiovascular: Normal rate and regular rhythm.   No murmur heard. Respiratory: Effort normal and breath sounds normal. No respiratory distress. He has no wheezes. He has no rales.  GI: Soft. Bowel sounds are normal. He exhibits no distension. There is no tenderness. There is no rebound.  Genitourinary:  Scrotum with two skin tags.   Musculoskeletal: He exhibits no edema or tenderness.  Neurological: He is alert.  Makes eye contact. Right facial weakness with moderate to severe dysarthria/decreased tongue control.  Verbal output limited to "uh huh" and "yes".  He was able to state his name. Right inattention with evidence of R-HH. Able to follow simple motor commands and point to basic items with occasional perseverative behaviors. Expressive greater than receptive aphasia with apraxia.  Nodded appropriately to basic Y/N biographic information. Sensation appears intact.  RUE 2- to 2/5 delt,bicep, tricep, 1 to 1+ wrist/hand. RLE: 2+ to 3/5 HF, KE, ADF/APF. Senses gross pain on right. Difficult to assess light touch. LUE and LLE 4 to 5/5. Toes up on right. DTR's 3+ on right 2+ on left.   Skin: Skin is warm and dry.  Psychiatric:  Pleasant, cooperative.     Laboratory  01/23: NA-136  K- 4.5  CL-100  CO2-23  BUN- 88 Cr- 7.4  Ca- 8.1   Hgb: 11.3   Hct: 32.5  WBC: 22.6  Plt: 177 Lipase 1112     Medical Problem List and Plan: 1.  Right hemiparesis and aphasia secondary to bi-cerebral embolic infarcts (L>R) 2.  PE/DVT/Stoke/Anticoagulation: Pharmaceutical: Lovenox bid per records from  [place]. Pharmacy recommending treatment dose Heparin due to bleeding risk/concerns of lovenox use in ESRD. Will place order for Hem/Onc Consult for input.  Updated wife on risks/concerns expressed by pharmacy and that we will use iv heparin gtt at this time.  3. Pain Management: Ultram prn.  4. Mood: LCSW to follow for evaluation and support.  5. Neuropsych: This patient is not fully capable of making decisions on his own behalf. 6. Skin/Wound Care: Routine pressure relief measures.  7. Fluids/Electrolytes/Nutrition: Renal restrictions. 1200 cc/FR.  8. DM type 2:  Monitor BS ac/hs. Continue levemir and use SSI for elevated BS. 9. HTN: Monitor BP bid and titrate medications as needed. Elevated at admission. Will set parameters on Midodrine.  10. ESRD on HD: per [facility]A. HD tomorrow after therapies. Continue diet/volume mgt      Post Admission Physician Evaluation: 1. Functional deficits secondary  to embolic CVA. 2. Patient is admitted to receive collaborative, interdisciplinary care between the physiatrist, rehab nursing staff, and therapy team. 3. Patient's level of medical complexity and substantial therapy needs in context of that medical necessity cannot be provided at a lesser intensity of care such as a SNF. 4. Patient has experienced substantial functional loss from his/her baseline which was documented above under the "Functional History" and "Functional Status" headings.  Judging by the patient's diagnosis, physical exam, and functional history, the patient has potential for functional progress which will result in measurable gains while on inpatient rehab.  These gains will be of substantial and practical use upon discharge  in facilitating mobility and self-care at the household level. 5. Physiatrist will provide 24 hour management of medical needs as well as oversight of the therapy plan/treatment and provide guidance as appropriate regarding the interaction of the two. 6. 24 hour  rehab nursing will assist with bladder management, bowel management, safety, skin/wound care, disease management, medication administration, pain management and patient education  and help integrate therapy concepts, techniques,education, etc. 7. PT will assess and treat for/with: Lower extremity strength, range of motion, stamina, balance, functional mobility, safety, adaptive techniques and equipment, NMR, visual-spatial awareness, cognitive pereceptual rx, family education, ego support.   Goals are: supervision to min assist. 8. OT will assess and treat for/with: ADL's, functional mobility, safety, upper extremity strength, adaptive techniques and equipment, NMR, visual-spatial and cognitive perceptual awareness, community reintegration.   Goals are: supervision to min assist. Therapy may not yet proceed with showering this patient. 9. SLP will assess and treat for/with: cognition,  language, swallowing, education.  Goals are: supervision to mod assist. 10. Case Management and Social Worker will assess and treat for psychological issues and discharge planning. 11. Team conference will be held weekly to assess progress toward goals and to determine barriers to discharge. 12. Patient will receive at least 3 hours of therapy per day at least 5 days per week. 13. ELOS: 17-22 days       14. Prognosis:  excellent     Meredith Staggers, MD, [place] [facility] 10/12/2015   10/12/2015

## 2015-10-13 NOTE — Progress Notes (Signed)
ANTICOAGULATION CONSULT NOTE - Follow Up Consult  Pharmacy Consult for Heparin  Indication: Hx PE/CVA/hx hypercoagulable state  Allergies  Allergen Reactions  . Codeine Other (See Comments)    Pt unknown  . Codeine Rash and Other (See Comments)    Unknown reaction (patient says it was more serious than just a rash, but he can't remember what happened)    Patient Measurements: Height: 5' 11"  (180.3 cm) Weight: 209 lb 14.1 oz (95.2 kg) IBW/kg (Calculated) : 75.3   Labs:  Recent Labs  10/12/15 1920 10/13/15 0540 10/13/15 1153  HGB 11.2* 10.6*  --   HCT 32.5* 31.0*  --   PLT 267 285  --   HEPARINUNFRC  --  0.37 0.18*  CREATININE 6.07*  --   --     Estimated Creatinine Clearance: 17 mL/min (by C-G formula based on Cr of 6.07).  Assessment:  50 yo male with hx of PE and rect embolic CVAs will be converted from lovenox to heparin per hem/onc recommendation.  Patient also has hx of GI bleeding and renal failure.  Was placed on lovenox at outside hospital.    HL subtherapeutic at 0.18.  Hgb 10.6, Plts 285. No line or bleeding issues per RN.  Will not bolus due to hx of bleeding.     Goal of Therapy:  Heparin level 0.3-0.5 units/ml Monitor platelets by anticoagulation protocol: Yes   Plan:  -Increase heparin drip at 1500 units/hr -8 hour HL -Daily HL, CBC -Monitor for s/sx of bleeding   Bennye Alm, PharmD Pharmacy Resident [phone]  10/13/2015,1:34 PM

## 2015-10-13 NOTE — Consult Note (Signed)
Reason for Consult: Continuity of ESRD care Referring Physician: Alysia Penna MD (PM&R)  HPI:  50 year old African-American man with past medical history significant for chronic kidney disease stage 4/5 who was being followed up previously by Dr. Florene Glen at [name] prior to his transfer of care when he moved to [facility] to a local nephrologist there. Apparently was ongoing preparations for dialysis but prior to that suffered hospitalization with acute pancreatitis, acute on chronic renal failure, PE/DVT complicated by embolic CVA. He also has underlying history of diabetes mellitus, sickle cell trait and MGUS. At the outside hospital, he was deemed to be now in end-stage renal disease and started on hemodialysis on a Monday/Wednesday/Friday schedule.  When I saw him today, he denied any active complaint including no chest pain, shortness of breath, nausea or vomiting.   Past Medical History  Diagnosis Date  . Diabetes mellitus without complication   . Sickle cell-thalassemia disease     a. Sickle cell trait.  . Pulmonary embolism 07/2011    a. Tx with Coumadin for 6 months (unknown cause per patient).  . Hyperkalemia 07/2011  . Acute renal failure 07/2011  . Monoclonal gammopathy   . CKD (chronic kidney disease)   . Sleep apnea     a. uses CPAP.  [name] Hypertension   . Sickle cell-thalassemia disease   . Gout   . Pulmonary embolism 07/2011; 09/27/2014    a. Bilat PE 07/2011 - unclear cause, tx with 6 months Coumadin.;   . Monoclonal gammopathies   . Anemia   . Type II diabetes mellitus   . Asthma   . OSA on CPAP   . Sickle-cell thalassemia   . CKD (chronic kidney disease), stage IV   . History of recent blood transfusion 10/27/14    2 Units PRBC's    Past Surgical History  Procedure Laterality Date  . Cholecystectomy    . Bone marrow biopsy    . Other surgical history      Retinal surgery  . Pars plana vitrectomy  02/17/2012    Procedure: PARS  PLANA VITRECTOMY WITH 25 GAUGE;  Surgeon: Hayden Pedro, MD;  Location: [place];  Service: Ophthalmology;  Laterality: Right;  . Cholecystectomy  1990's?  . Eye surgery Right     Family History  Problem Relation Age of Onset  . Hypertension Mother   . Diabetes Mother   . Sickle cell anemia Brother   . Diabetes type I Brother   . Kidney disease Brother     Social History:  reports that he has never smoked. He has never used smokeless tobacco. He reports that he does not drink alcohol or use illicit drugs.  Allergies:  Allergies  Allergen Reactions  . Codeine Other (See Comments)    Pt unknown  . Codeine Rash and Other (See Comments)    Unknown reaction (patient says it was more serious than just a rash, but he can't remember what happened)    Medications:  Scheduled: . aspirin EC  81 mg Oral Daily  . escitalopram  10 mg Oral Daily  . insulin aspart  0-5 Units Subcutaneous QHS  . insulin aspart  0-9 Units Subcutaneous TID WC  . insulin detemir  15 Units Subcutaneous QHS  . midodrine  5 mg Oral TID WC  . montelukast  10 mg Oral QHS  . NON FORMULARY 1 Inhaler  1 Inhaler Inhalation Q1200  . pantoprazole  40 mg Oral BID    BMP Latest Ref  Rng 10/12/2015 09/06/2015 10/27/2014  Glucose 65 - 99 mg/dL 318(H) 98 229(H)  BUN 6 - 20 mg/dL 42(H) 54.0(H) 60.4(H)  Creatinine 0.61 - 1.24 mg/dL 6.07(H) 5.8(HH) 4.8(HH)  Sodium 135 - 145 mmol/L 132(L) 138 132(L)  Potassium 3.5 - 5.1 mmol/L 5.2(H) 4.8 3.4(L)  Chloride 101 - 111 mmol/L 96(L) - -  CO2 22 - 32 mmol/L 20(L) 15(L) 18(L)  Calcium 8.9 - 10.3 mg/dL 9.2 7.6(L) 7.6(L)   CBC Latest Ref Rng 10/13/2015 10/12/2015 09/06/2015  WBC 4.0 - 10.5 K/uL 15.5(H) 16.2(H) 17.1(H)  Hemoglobin 13.0 - 17.0 g/dL 10.6(L) 11.2(L) 11.3(L)  Hematocrit 39.0 - 52.0 % 31.0(L) 32.5(L) 32.8(L)  Platelets 150 - 400 K/uL 285 267 322     No results found.  Review of Systems  Constitutional: Negative for fever and chills.  HENT: Negative for congestion and  nosebleeds.   Respiratory: Positive for cough. Negative for sputum production and shortness of breath.   Cardiovascular: Negative for chest pain and orthopnea.  Gastrointestinal: Negative for nausea, vomiting and abdominal pain.  Skin: Negative.   Neurological: Negative for headaches.   Blood pressure 137/94, pulse 102, temperature 97.9 F (36.6 C), temperature source Oral, resp. rate 19, height _0  (1.803 m), weight 95.2 kg (209 lb 14.1 oz), SpO2 100 %. Physical Exam  Nursing note and vitals reviewed. Constitutional: He is oriented to person, place, and time. He appears well-developed and well-nourished. No distress.  Sitting in wheelchair with obvious facial droop- drooling from side of mouth  HENT:  Mouth/Throat: Oropharynx is clear and moist. No oropharyngeal exudate.  Eyes: Pupils are equal, round, and reactive to light. No scleral icterus.  Neck: Neck supple. No JVD present. No thyromegaly present.  Cardiovascular: Regular rhythm.   Murmur heard. Regular tachycardia with 3/6 holosystolic murmur over LLSB  Respiratory: Effort normal.  Coarse/transmitted breath sounds bilaterally. Right IJ tunneled hemodialysis catheter  GI: Soft. Bowel sounds are normal. He exhibits no distension. There is no tenderness. There is no rebound.  Musculoskeletal: He exhibits edema.  Trace edema bilaterally  Neurological: He is alert and oriented to person, place, and time.  Skin: Skin is warm and dry.    Assessment/Plan: 1. Status post embolic CVA/right-sided hemiparesis/facial droop with dysarthria versus aphasia: Transferred from [place], [place] to inpatient rehabilitation here at [facility] to be closer to his place of residence. Hematology consulted to help assist with chronic anticoagulation options. 2. Progressive chronic kidney disease-now likely end-stage renal disease: Was seen by nephrology down in [place] and was being teed up for hemodialysis access placement versus  consideration for PD. His current physical status is likely to be a big barrier to pursuing peritoneal dialysis. He is getting hemodialysis via a right IJ tunneled dialysis catheter. Will order for hemodialysis today and then resume a Monday/Wednesday/Friday schedule leaning next week. Vein mapping will be ordered and I will consult with vascular surgery for placement of permanent dialysis access. We will eventually need local hemodialysis unit placement when he is released from inpatient rehabilitation. 3. Anemia of chronic kidney disease: Hemoglobin at this time appears to be acceptable, will resume ESA when less than 10. Check iron studies today. 4. Metabolic bone disease: Check calcium and phosphorus with labs today-start binder if indicated. PTH with next labs. 5. Recent PE/DVT: On anticoagulation with heparin drip, awaiting hematology input into chronic anticoagulation (recent GI bleed). 6. Monoclonal gammopathy of undetermined significance: Anticipate follow-up to continue with hematology locally.   Isaiah Bowens K. 10/13/2015, 9:47 AM

## 2015-10-13 NOTE — Progress Notes (Signed)
Subjective/Complaints:   Objective: Vital Signs: Blood pressure 137/94, pulse 102, temperature 97.9 F (36.6 C), temperature source Oral, resp. rate 19, height _0  (1.803 m), weight 95.2 kg (209 lb 14.1 oz), SpO2 100 %. No results found. Results for orders placed or performed during the hospital encounter of 10/12/15 (from the past 72 hour(s))  Glucose, capillary     Status: Abnormal   Collection Time: 10/12/15  6:16 PM  Result Value Ref Range   Glucose-Capillary 289 (H) 65 - 99 mg/dL  CBC     Status: Abnormal   Collection Time: 10/12/15  7:20 PM  Result Value Ref Range   WBC 16.2 (H) 4.0 - 10.5 K/uL   RBC 3.77 (L) 4.22 - 5.81 MIL/uL   Hemoglobin 11.2 (L) 13.0 - 17.0 g/dL   HCT 32.5 (L) 39.0 - 52.0 %   MCV 86.2 78.0 - 100.0 fL   MCH 29.7 26.0 - 34.0 pg   MCHC 34.5 30.0 - 36.0 g/dL   RDW 18.8 (H) 11.5 - 15.5 %   Platelets 267 150 - 400 K/uL  Renal function panel     Status: Abnormal   Collection Time: 10/12/15  7:20 PM  Result Value Ref Range   Sodium 132 (L) 135 - 145 mmol/L   Potassium 5.2 (H) 3.5 - 5.1 mmol/L   Chloride 96 (L) 101 - 111 mmol/L   CO2 20 (L) 22 - 32 mmol/L   Glucose, Bld 318 (H) 65 - 99 mg/dL   BUN 42 (H) 6 - 20 mg/dL   Creatinine, Ser 6.07 (H) 0.61 - 1.24 mg/dL   Calcium 9.2 8.9 - 10.3 mg/dL   Phosphorus 3.7 2.5 - 4.6 mg/dL   Albumin 3.0 (L) 3.5 - 5.0 g/dL   GFR calc non Af Amer 10 (L) >60 mL/min   GFR calc Af Amer 11 (L) >60 mL/min    Comment: (NOTE) The eGFR has been calculated using the CKD EPI equation. This calculation has not been validated in all clinical situations. eGFR's persistently <60 mL/min signify possible Chronic Kidney Disease.    Anion gap 16 (H) 5 - 15  Glucose, capillary     Status: Abnormal   Collection Time: 10/12/15  9:18 PM  Result Value Ref Range   Glucose-Capillary 420 (H) 65 - 99 mg/dL   Comment 1 Notify RN   Heparin level (unfractionated)     Status: None   Collection Time: 10/13/15  5:40 AM  Result Value Ref Range    Heparin Unfractionated 0.37 0.30 - 0.70 IU/mL    Comment:        IF HEPARIN RESULTS ARE BELOW EXPECTED VALUES, AND PATIENT DOSAGE HAS BEEN CONFIRMED, SUGGEST FOLLOW UP TESTING OF ANTITHROMBIN III LEVELS.   CBC     Status: Abnormal   Collection Time: 10/13/15  5:40 AM  Result Value Ref Range   WBC 15.5 (H) 4.0 - 10.5 K/uL   RBC 3.61 (L) 4.22 - 5.81 MIL/uL   Hemoglobin 10.6 (L) 13.0 - 17.0 g/dL   HCT 31.0 (L) 39.0 - 52.0 %   MCV 85.9 78.0 - 100.0 fL   MCH 29.4 26.0 - 34.0 pg   MCHC 34.2 30.0 - 36.0 g/dL   RDW 18.7 (H) 11.5 - 15.5 %   Platelets 285 150 - 400 K/uL  Glucose, capillary     Status: Abnormal   Collection Time: 10/13/15  6:41 AM  Result Value Ref Range   Glucose-Capillary 235 (H) 65 - 99 mg/dL   Comment  1 Notify RN       General: No acute distress Mood and affect are appropriate Heart: Regular rate and rhythm no rubs murmurs or extra sounds Lungs: Clear to auscultation, breathing unlabored, no rales or wheezes Abdomen: Positive bowel sounds, soft nontender to palpation, nondistended Extremities: No clubbing, cyanosis, or edema Skin: No evidence of breakdown, no evidence of rash Neurologic: Cranial nerves II through XII intact, motor strength is 5/5 in left deltoid, bicep, tricep, grip, hip flexor, knee extensors, ankle dorsiflexor and plantar flexor 2 minus in the right deltoid, biceps, triceps, grip, 3 minus left hip flexor and extensor, 2 minus left ankle dorsiflexor Sensory examDifficult to assess secondary to a aphasia, does feel pinch in all 4 limbs Cerebellar exam normal finger to nose to finger as well as heel to shin in bilateral upper and lower extremities Musculoskeletal: Full range of motion in all 4 extremities. No joint swelling   Assessment/Plan: 1. Functional deficits secondary to Right hemiparesis and aphasia secondary to bi-cerebral embolic infarcts (L>R) which require 3+ hours per day of interdisciplinary therapy in a comprehensive inpatient  rehab setting. Physiatrist is providing close team supervision and 24 hour management of active medical problems listed below. Physiatrist and rehab team continue to assess barriers to discharge/monitor patient progress toward functional and medical goals. FIM: Function - Bathing Body parts bathed by patient: Right arm, Chest, Abdomen, Front perineal area, Right upper leg, Left upper leg Body parts bathed by helper: Left arm, Buttocks, Right lower leg, Left lower leg, Back Assist Level: Touching or steadying assistance(Pt > 75%)  Function- Upper Body Dressing/Undressing What is the patient wearing?: Pull over shirt/dress Pull over shirt/dress - Perfomed by patient: Thread/unthread left sleeve, Put head through opening Pull over shirt/dress - Perfomed by helper: Thread/unthread right sleeve, Pull shirt over trunk Assist Level: Touching or steadying assistance(Pt > 75%) Function - Lower Body Dressing/Undressing What is the patient wearing?: Non-skid slipper socks, Pants Pants- Performed by helper: Thread/unthread right pants leg, Thread/unthread left pants leg, Pull pants up/down Non-skid slipper socks- Performed by helper: Don/doff right sock, Don/doff left sock Assist for footwear: Maximal assist Assist for lower body dressing: Touching or steadying assistance (Pt > 75%)              Function - Comprehension Comprehension: Auditory Comprehension assist level: Understands basic 75 - 89% of the time/ requires cueing 10 - 24% of the time  Function - Expression Expression: Verbal Expression assist level: Expresses basic 50 - 74% of the time/requires cueing 25 - 49% of the time. Needs to repeat parts of sentences.  Function - Social Interaction Social Interaction assist level: Interacts appropriately 75 - 89% of the time - Needs redirection for appropriate language or to initiate interaction.  Function - Problem Solving Problem solving assist level: Solves basic 50 - 74% of the  time/requires cueing 25 - 49% of the time  Function - Memory Memory assist level: Recognizes or recalls 50 - 74% of the time/requires cueing 25 - 49% of the time Patient normally able to recall (first 3 days only): Current season, That he or she is in a hospital  Medical Problem List and Plan: 1. Right hemiparesis and aphasia secondary to bi-cerebral embolic infarcts (L>R) 2. PE/DVT/Stoke/Anticoagulation: Pharmaceutical: Lovenox bid per records from [place].  use iv heparin gtt at this time. Discussed with hematologist Dr Alen Blew , Long-term anticoagulation will be decided next week 3. Pain Management: Ultram prn.  4. Mood: LCSW to follow for evaluation and support.  5. Neuropsych: This patient is not fully capable of making decisions on his own behalf. 6. Skin/Wound Care: Routine pressure relief measures.  7. Fluids/Electrolytes/Nutrition: Renal restrictions. 1200 cc/FR.  8. DM type 2: Monitor BS ac/hs. Continue levemir and use SSI for elevated BS. 9. HTN: Monitor BP bid and titrate medications as needed. Elevated at admission. Will set parameters on Midodrine.  10. ESRD on HD: per [facility]. HD tomorrow after therapies. Continue diet/volume mgt, Discussed with nephrology 11. Leukocytosis will check UA, no breathing difficulties or coughing. Afebrile LOS (Days) 1 A FACE TO FACE EVALUATION WAS PERFORMED  Shina Wass E 10/13/2015, 10:22 AM

## 2015-10-13 NOTE — Evaluation (Signed)
Speech Language Pathology Assessment and Plan  Patient Details  Name: [name] MRN: [id number] Date of Birth: 1965/09/24  SLP Diagnosis: Aphasia;Dysarthria;Cognitive Impairments;Dysphagia  Rehab Potential: Excellent ELOS: 17-21 days    Today's Date: 10/13/2015 SLP Individual Time: 1300-1400 SLP Individual Time Calculation (min): 60 min   Problem List:  Patient Active Problem List   Diagnosis Date Noted  . Hemiparesis, aphasia, and dysphagia as late effects of cerebrovascular accident ([place]) 10/13/2015  . Gait disturbance, post-stroke 10/13/2015  . Embolic stroke ([place]) 06/14/1218  . Plasma cell disorder   . Anemia 10/31/2014  . Long term current use of anticoagulant therapy 10/31/2014  . Renal insufficiency 10/31/2014  . Iron deficiency anemia due to chronic blood loss 10/30/2014  . Encounter for therapeutic drug monitoring 10/11/2014  . Pulmonary embolus ([place]) 09/27/2014  . ESRD on dialysis ([place]) 09/27/2014  . MGUS (monoclonal gammopathy of unknown significance) 09/27/2014  . OSA on CPAP 09/27/2014  . Diabetic retinopathy of both eyes ([place]) 02/03/2012  . Sickle cell trait ([place]) 07/19/2011  . Morbid obesity ([place]) 07/19/2011  . PE (pulmonary embolism) 07/18/2011  . SOB (shortness of breath) 07/18/2011  . Hyperkalemia 07/18/2011  . ARF (acute renal failure) ([place]) 07/18/2011  . Leukocytosis, unspecified 07/18/2011  . DM (diabetes mellitus) type II controlled with renal manifestation ([place]) 07/18/2011  . Cardiac enzymes elevated 07/18/2011  . Metabolic acidosis 75/88/3254  . HTN (hypertension) 07/18/2011   Past Medical History:  Past Medical History  Diagnosis Date  . Diabetes mellitus without complication   . Sickle cell-thalassemia disease     a. Sickle cell trait.  . Pulmonary embolism 07/2011    a. Tx with Coumadin for 6 months (unknown cause per patient).  . Hyperkalemia 07/2011  . Acute renal failure 07/2011  . Monoclonal gammopathy   . CKD (chronic kidney  disease)   . Sleep apnea     a. uses CPAP.  [name] Hypertension   . Sickle cell-thalassemia disease   . Gout   . Pulmonary embolism 07/2011; 09/27/2014    a. Bilat PE 07/2011 - unclear cause, tx with 6 months Coumadin.;   . Monoclonal gammopathies   . Anemia   . Type II diabetes mellitus   . Asthma   . OSA on CPAP   . Sickle-cell thalassemia   . CKD (chronic kidney disease), stage IV   . History of recent blood transfusion 10/27/14    2 Units PRBC's   Past Surgical History:  Past Surgical History  Procedure Laterality Date  . Cholecystectomy    . Bone marrow biopsy    . Other surgical history      Retinal surgery  . Pars plana vitrectomy  02/17/2012    Procedure: PARS PLANA VITRECTOMY WITH 25 GAUGE;  Surgeon: Hayden Pedro, MD;  Location: [facility];  Service: Ophthalmology;  Laterality: Right;  . Cholecystectomy  1990's?  . Eye surgery Right     Assessment / Plan / Recommendation Clinical Impression Isaiah [name] is a 50 year old right-handed male with history of DM type 2, Sickle cell-thalassemia, B-PE 11/12, OSA, CKD stage V pending dialysis, anemia who was admitted to [facility] in [place] on 09/16/15 with N/V and bloodly stools with acute on chronic renal failure due to acute pancreatitis. He was treated with supportive care, IV bicarb as well as multiple units PRBC and EGD with severe stage IV erosive esophagitis with gastritis. HD initiated on 01/09 due to poor recovery and currently ongoing dialysis on MWF.  He developed hypoxic respiratory failure requiring BIPAP and was found to have RUL/RML PE as well as left popliteal DVT. He was also found to have left retrocardiac PNA and treated with IV antibiotics as well as IV heparin. Elevated troponin's felt to be due to demand ischemia due to PE and was treated with BB and ASA per cardiology input. IVC filter was placed and post procedure was found to have right hand weakness.   CT head reportedly  showed subacute infarcts. Neurology was consulted for input and EMG showed evidence of profound neuropathy. He had worsening of symptoms with increase in right sided weakness, facial droop speech difficulty on 09/27/15. MRI on 01/21 showed scattered foci of cortical, cerebellar and deep gray matter infarcts with mild petechial hemorrhage and developing laminar necrosis. On 01/22, he had worsening of aphasia and repeat MRI brain 1/22 showed newrestricted diffusion in left temporal and larger more prominent area in posterior left frontal/ perirolandic area involving motor strip in addition to numerous scattered infarcts in bilateral cerebral and cerebellar hemispheres. Echo with positive bubble study and TEE positive for PFO. IV heparin was changed to Lovenox due to recurrent embolic strokes on heparin. MBS swallow evaluation revealed a moderate oral and mild pharyngeal dysphagia. He was started on a Dys. 1 texture and thin liquid diet. Therapy ongoing and patient making gains. CIR was recommended for follow up therapy and chart/notes reviewed by Rehab MD and Rehab CM. He was deemed to be appropriate for CIR and cleared for admission 10/12/15.   Patient was admitted to [place] and continues to demonstrate dysphagia that is primarily oral based and consistent with recent MBS; however, patient with difficulty triggering second swallow quickly/on demand.  As a result, patient requires consistent cues for a slow pace to allow time for his second swallow.  Patient also demonstrates moderate cognitive impairments characterized by poor emergent awareness and as well as recall of new information.  Patient's language is marked by high level receptive deficits and basic expressive deficits that are compounded by dysarthric speech.   These deficits impact the patient's overall safety with functional self-care tasks as well as ability to express basic wants and needs. Patient also demonstrates verbal  perseveration and suspected motor planning deficits. Patient would benefit from skilled SLP intervention in order to maximize his functional independence prior to discharge. Anticipate patient will require 24 hour supervision at home and follow up SLP services.     Skilled Therapeutic Interventions          Bedside swallow and cognitive-linguistic evaluations completed with results and recommendations reviewed with patient; no family present.  Patient able to increase accuracy of named objects with Max assist semantic and phonemic cues.      SLP Assessment  Patient will need skilled Speech Lanaguage Pathology Services during CIR admission    Recommendations  SLP Diet Recommendations: Thin;Dysphagia 1 (Puree) Liquid Administration via: Straw;Cup Medication Administration: Crushed with puree (whole if small) Supervision: Patient able to self feed;Full supervision/cueing for compensatory strategies Compensations: Minimize environmental distractions;Slow rate;Small sips/bites;Multiple dry swallows after each bite/sip Postural Changes and/or Swallow Maneuvers: Seated upright 90 degrees Oral Care Recommendations: Oral care BID Patient destination: Home Follow up Recommendations: 24 hour supervision/assistance;Home Health SLP;Outpatient SLP Equipment Recommended: None recommended by SLP    SLP Frequency 3 to 5 out of 7 days   SLP Duration  SLP Intensity  SLP Treatment/Interventions 17-21 days  Minumum of 1-2 x/day, 30 to 90 minutes  Cognitive remediation/compensation;Cueing hierarchy;Dysphagia/aspiration precaution training;Environmental  controls;Functional tasks;Internal/external aids;Patient/family education;Speech/Language facilitation;Therapeutic Exercise    Pain Pain Assessment Pain Assessment: No/denies pain  Prior Functioning Cognitive/Linguistic Baseline: Within functional limits Vocation: Full time employment  Function:  Eating Eating   Modified Consistency Diet:  Yes Eating Assist Level: Supervision or verbal cues;Set up assist for   Eating Set Up Assist For: Opening containers;Cutting food       Cognition Comprehension Comprehension assist level: Understands basic 75 - 89% of the time/ requires cueing 10 - 24% of the time  Expression   Expression assist level: Expresses basic 25 - 49% of the time/requires cueing 50 - 75% of the time. Uses single words/gestures.  Social Interaction Social Interaction assist level: Interacts appropriately 75 - 89% of the time - Needs redirection for appropriate language or to initiate interaction.  Problem Solving Problem solving assist level: Solves basic 25 - 49% of the time - needs direction more than half the time to initiate, plan or complete simple activities  Memory Memory assist level: Recognizes or recalls 50 - 74% of the time/requires cueing 25 - 49% of the time   Short Term Goals: Week 1: SLP Short Term Goal 1 (Week 1): Pt will demonstrate effective mastication of Dys.2 textures evidenced by no more than Min assist verbal cues to monitor residue/pocketing. SLP Short Term Goal 2 (Week 1): Pt will consume current diet with small portions at a slow pace with Min verbal cues. SLP Short Term Goal 3 (Week 1): Pt will follow 2 step directions with Min verbal and visual cues.  SLP Short Term Goal 4 (Week 1): Pt will self-monitor verbal perseveration with Max assist multimodal cues. SLP Short Term Goal 5 (Week 1): Pt will name familiar objects with Mod assist semantic and phonemic cues.  SLP Short Term Goal 6 (Week 1): Pt will utilize over articulation for production of phonemes at the CV, CVC/word level for accurate productions of vowels and bilabials with Min verbal, visual cues   Refer to Care Plan for Long Term Goals  Recommendations for other services: None  Discharge Criteria: Patient will be discharged from SLP if patient refuses treatment 3 consecutive times without medical reason, if treatment goals not  met, if there is a change in medical status, if patient makes no progress towards goals or if patient is discharged from hospital.  The above assessment, treatment plan, treatment alternatives and goals were discussed and mutually agreed upon: by patient  Gunnar Fusi, M.A., CCC-SLP [phone]  [place] 10/13/2015, 6:18 PM

## 2015-10-13 NOTE — Progress Notes (Signed)
Patient arrived to unit by bed.  Reviewed treatment plan and this RN agrees with plan.  Report received from bedside RN, Caryl Pina.  Consent obtained.  Patient A & O X 4.   Lung sounds clear to ausculation in all fields. No edema. Cardiac:  Regular R&R.  Removed caps and cleansed RIJ catheter with chlorhedxidine.  Aspirated ports of heparin and flushed them with saline per protocol.  Connected and secured lines, initiated treatment at 1515.  UF Goal of 3037m and net fluid removal 2.5L.  Will continue to monitor.

## 2015-10-13 NOTE — Progress Notes (Signed)
Occupational Therapy Assessment and Plan  Patient Details  Name: Isaiah [name] MRN: [id number] Date of Birth: 1966/09/08  OT Diagnosis: abnormal posture, apraxia, hemiplegia affecting dominant side and muscle weakness (generalized) Rehab Potential: Rehab Potential (ACUTE ONLY): Good ELOS: 17-21 days   Today's Date: 10/13/2015 OT Individual Time: 0865-7846 OT Individual Time Calculation (min): 75 min     Problem List:  Patient Active Problem List   Diagnosis Date Noted  . Hemiparesis, aphasia, and dysphagia as late effects of cerebrovascular accident ([place]) 10/13/2015  . Gait disturbance, post-stroke 10/13/2015  . Embolic stroke ([place]) 96/29/5284  . Plasma cell disorder   . Anemia 10/31/2014  . Long term current use of anticoagulant therapy 10/31/2014  . Renal insufficiency 10/31/2014  . Iron deficiency anemia due to chronic blood loss 10/30/2014  . Encounter for therapeutic drug monitoring 10/11/2014  . Pulmonary embolus ([place]) 09/27/2014  . ESRD on dialysis ([place]) 09/27/2014  . MGUS (monoclonal gammopathy of unknown significance) 09/27/2014  . OSA on CPAP 09/27/2014  . Diabetic retinopathy of both eyes ([place]) 02/03/2012  . Sickle cell trait ([place]) 07/19/2011  . Morbid obesity ([place]) 07/19/2011  . PE (pulmonary embolism) 07/18/2011  . SOB (shortness of breath) 07/18/2011  . Hyperkalemia 07/18/2011  . ARF (acute renal failure) ([employer] [employer]) 07/18/2011  . Leukocytosis, unspecified 07/18/2011  . DM (diabetes mellitus) type II controlled with renal manifestation ([place]) 07/18/2011  . Cardiac enzymes elevated 07/18/2011  . Metabolic acidosis 13/24/4010  . HTN (hypertension) 07/18/2011    Past Medical History:  Past Medical History  Diagnosis Date  . Diabetes mellitus without complication   . Sickle cell-thalassemia disease     a. Sickle cell trait.  . Pulmonary embolism 07/2011    a. Tx with Coumadin for 6 months (unknown cause per patient).  . Hyperkalemia 07/2011  . Acute renal failure  07/2011  . Monoclonal gammopathy   . CKD (chronic kidney disease)   . Sleep apnea     a. uses CPAP.  [name] Hypertension   . Sickle cell-thalassemia disease   . Gout   . Pulmonary embolism 07/2011; 09/27/2014    a. Bilat PE 07/2011 - unclear cause, tx with 6 months Coumadin.;   . Monoclonal gammopathies   . Anemia   . Type II diabetes mellitus   . Asthma   . OSA on CPAP   . Sickle-cell thalassemia   . CKD (chronic kidney disease), stage IV   . History of recent blood transfusion 10/27/14    2 Units PRBC's   Past Surgical History:  Past Surgical History  Procedure Laterality Date  . Cholecystectomy    . Bone marrow biopsy    . Other surgical history      Retinal surgery  . Pars plana vitrectomy  02/17/2012    Procedure: PARS PLANA VITRECTOMY WITH 25 GAUGE;  Surgeon: Hayden Pedro, MD;  Location: [facility];  Service: Ophthalmology;  Laterality: Right;  . Cholecystectomy  1990's?  . Eye surgery Right     Assessment & Plan Clinical Impression: Isaiah [name] is a 50 year old RH-male with history of DM type 2 with diabetic, Sickle cell-thalassemia, B-PE 11/12, OSA, CKD stage V pending dialysis, anemia who was admitted to [facility] in [place] on 09/16/15 with N/V and blood stools with acute on chronic renal failure due to acute pancreatitis. He was treated with supportive care, IV bicarb as well as multiple units PRBC and EGD with severe stage IV erosive esophagitis with gastritis. Noted to  have abnormal LFTs and Hep A, Hep B and Hep C panels negative. HD initiated on 01/09 due to poor recover with decrease in UOP and currently ongoing on MWF. He developed hypoxic respiratory failure requiring BIPAP? and was found to have RUL/RML PE as well as left popliteal DVT. He was also found to have left retrocardiac PNA and treated with IV antibiotics as well as IV heparin. Elevated troponin's felt to be due to demand ischemia due to PE and was treated with BB and  ASA per cardiology input. IVC filter was placed and post procedure was found to have right hand weakness.   CT head reportedly showed subacute to old strokes. Neurology was consulted for input and EMG showed evidence of profound neuropathy. He had worsening of symptoms with increase in right sided weakness, facial droop speech difficulty on 09/27/15. MRI on 01/21 showed Scattered foci of cortical, cerebellar and deep gray matter infarcts with mild petechial hemorrhage and developing laminar necrosis. On 01/22, he had worsening of aphasia and repeat MRI brain 1/22 showed new Restricted diffusion in left temporal and larger more prominent area in posterior left frontal/ perirolandic area involving motor strip in addition to numerous scattered infarcts in bilateral cerebral and cerebellar hemispheres. MRA without significant stenosis. Echo with positive bubble study and TEE positive for PFO. Hypercoagulation studies reported to be negative. IV heparin was changed to Lovenox due to recurrent embolic strokes on heparin. Swallow evaluation with moderate oral and mild pharyngeal dysphagia and MBS without aspiration. He was started on puree diet with thin liquids. Therapy ongoing and patient making gains.  Patient transferred to CIR on 10/12/2015 .    Patient currently requires mod with basic self-care skills secondary to muscle weakness, decreased cardiorespiratoy endurance, abnormal tone, motor apraxia, decreased coordination and decreased motor planning, decreased visual acuity, decreased problem solving and decreased safety awareness and decreased standing balance, decreased postural control, hemiplegia and decreased balance strategies.  Prior to hospitalization, patient could complete BADLs with independent .  Patient will benefit from skilled intervention to increase independence with basic self-care skills prior to discharge home with care partner.  Anticipate patient will require 24 hour supervision and  follow up home health.  OT - End of Session Activity Tolerance: Decreased this session Endurance Deficit: Yes Endurance Deficit Description: rest breaks OT Assessment Rehab Potential (ACUTE ONLY): Good OT Patient demonstrates impairments in the following area(s): Balance;Cognition;Edema;Endurance;Motor;Perception;Safety;Sensory;Vision OT Basic ADL's Functional Problem(s): Grooming;Bathing;Dressing;Toileting OT Transfers Functional Problem(s): Tub/Shower;Toilet OT Additional Impairment(s): Fuctional Use of Upper Extremity OT Plan OT Intensity: Minimum of 1-2 x/day, 45 to 90 minutes OT Frequency: 5 out of 7 days OT Duration/Estimated Length of Stay: 17-21 days OT Treatment/Interventions: Teacher, English as a foreign language;Functional electrical stimulation;Cognitive remediation/compensation;Functional mobility training;Community reintegration;Discharge planning;Neuromuscular re-education;Splinting/orthotics;Self Care/advanced ADL retraining;Patient/family education;Therapeutic Activities;Therapeutic Exercise;UE/LE Strength taining/ROM;UE/LE Coordination activities;Visual/perceptual remediation/compensation;Wheelchair propulsion/positioning OT Self Feeding Anticipated Outcome(s): n/a OT Basic Self-Care Anticipated Outcome(s): supervision OT Toileting Anticipated Outcome(s): min assist OT Bathroom Transfers Anticipated Outcome(s): supervision OT Recommendation Patient destination: Home Equipment Recommended: To be determined   Skilled Therapeutic Intervention OT evaluation completed. Discussed role of OT, goals of therapy, possible ELOS, follow-up, safety plan, and fall risk. OT utilized yes/no questions for majority of session due to expressive aphasia.   OT Evaluation Precautions/Restrictions  Precautions Precautions: Fall Restrictions Weight Bearing Restrictions: No General   Vital Signs Therapy Vitals Temp: 97.9 F (36.6 C) Temp Source: Oral Pulse  Rate: (!) 102 Resp: 19 BP: (!) 137/94 mmHg Patient Position (if appropriate): Lying Oxygen Therapy SpO2:  100 % O2 Device: Not Delivered Pain   Home Living/Prior Functioning Home Living Available Help at Discharge: Available 24 hours/day, Family Type of Home: Apartment Home Access: Elevator, Level entry Home Layout: One level Bathroom Shower/Tub: Government social research officer Accessibility: Yes Additional Comments: wife and son in [place] for son to finish [employer]  Lives With: Alone IADL History Occupation: Full time employment Prior Function Level of Independence: Independent with basic ADLs, Independent with homemaking with ambulation, Independent with gait, Independent with transfers ADL   Vision/Perception  Vision- History Baseline Vision/History: Wears glasses Wears Glasses: Reading only Patient Visual Report: Blurring of vision;Diplopia Vision- Assessment Vision Assessment?: Yes Eye Alignment: Within Functional Limits Alignment/Gaze Preference: Within Defined Limits Tracking/Visual Pursuits: Requires cues, head turns, or add eye shifts to track;Unable to hold eye position out of midline Saccades: Impaired - to be further tested in functional context Convergence: Impaired - to be further tested in functional context Diplopia Assessment: Other (comment) (difficult to determine due to expressive aphasia)  Cognition Overall Cognitive Status: Impaired/Different from baseline (utilized yes/no questions due to expressive aphasia) Arousal/Alertness: Awake/alert Orientation Level: Person;Situation Person: Oriented Place: Disoriented Situation: Oriented Year: 2017 Month: March Memory: Impaired Attention: Sustained Sustained Attention: Appears intact Awareness: Impaired Awareness Impairment: Emergent impairment Problem Solving: Impaired Problem Solving Impairment: Functional basic Safety/Judgment: Appears intact Sensation Sensation Light Touch:  Impaired by gross assessment Coordination Gross Motor Movements are Fluid and Coordinated: No Fine Motor Movements are Fluid and Coordinated: No Finger Nose Finger Test: unable to complete with RUE; LUE apraxia Motor  Motor Motor: Motor apraxia;Abnormal tone Mobility  Bed Mobility Bed Mobility: Supine to Sit Supine to Sit: 3: Mod assist Transfers Transfers: Sit to Stand;Stand to Sit Sit to Stand: 3: Mod assist Stand to Sit: 3: Mod assist  Trunk/Postural Assessment  Cervical Assessment Cervical Assessment: Exceptions to [facility] (forward head) Thoracic Assessment Thoracic Assessment: Exceptions to [facility] Lumbar Assessment Lumbar Assessment: Exceptions to [facility] Postural Control Postural Control: Deficits on evaluation  Balance Balance Balance Assessed: Yes Dynamic Sitting Balance Dynamic Sitting - Balance Support: Left upper extremity supported Dynamic Sitting - Level of Assistance: 5: Stand by assistance Static Standing Balance Static Standing - Balance Support: Right upper extremity supported;Left upper extremity supported Static Standing - Level of Assistance: 3: Mod assist;4: Min assist Dynamic Standing Balance Dynamic Standing - Balance Support: During functional activity Dynamic Standing - Level of Assistance: 3: Mod assist Extremity/Trunk Assessment RUE Assessment RUE Assessment: Exceptions to [facility] (grossly 2/5 strength; minimal digit AROM) LUE Assessment LUE Assessment: Within Functional Limits   See Function Navigator for Current Functional Status.   Refer to Care Plan for Long Term Goals  Recommendations for other services: None  Discharge Criteria: Patient will be discharged from OT if patient refuses treatment 3 consecutive times without medical reason, if treatment goals not met, if there is a change in medical status, if patient makes no progress towards goals or if patient is discharged from hospital.  The above assessment, treatment plan, treatment alternatives  and goals were discussed and mutually agreed upon: by patient  Duayne Cal 10/13/2015, 10:32 AM

## 2015-10-13 NOTE — Progress Notes (Signed)
ANTICOAGULATION CONSULT NOTE - Follow Up Consult  Pharmacy Consult for Heparin  Indication: Hx PE/CVA  Allergies  Allergen Reactions  . Codeine Other (See Comments)    Pt unknown  . Codeine Rash and Other (See Comments)    Unknown reaction (patient says it was more serious than just a rash, but he can't remember what happened)    Patient Measurements: Height: 5' 11"  (180.3 cm) Weight: 211 lb (95.709 kg) IBW/kg (Calculated) : 75.3   Labs:  Recent Labs  10/12/15 1920 10/13/15 0540  HGB 11.2* 10.6*  HCT 32.5* 31.0*  PLT 267 285  HEPARINUNFRC  --  0.37  CREATININE 6.07*  --     Estimated Creatinine Clearance: 17 mL/min (by C-G formula based on Cr of 6.07).  Assessment: Initial heparin level is within therapeutic range  Goal of Therapy:  Heparin level 0.3-0.5 units/ml Monitor platelets by anticoagulation protocol: Yes   Plan:  -Cont heparin drip at 1200 units/hr -1200 confirmatory HL  Narda Bonds 10/13/2015,6:20 AM

## 2015-10-13 NOTE — Evaluation (Addendum)
Physical Therapy Assessment and Plan  Patient Details  Name: Abby Stines [facility] MRN: [id number] Date of Birth: [birth year]  PT Diagnosis: Abnormal posture, Abnormality of gait, Cognitive deficits, Difficulty walking, Hemiparesis dominant, Impaired cognition and Muscle weakness Rehab Potential: Fair ELOS: 14 to 21 days   Today's Date: 10/13/2015 PT Individual Time: 1000-1100 PT Individual Time Calculation (min): 60 min    Problem List:  Patient Active Problem List   Diagnosis Date Noted  . Hemiparesis, aphasia, and dysphagia as late effects of cerebrovascular accident ([place]) 10/13/2015  . Gait disturbance, post-stroke 10/13/2015  . Embolic stroke ([place]) 89/37/3428  . Plasma cell disorder   . Anemia 10/31/2014  . Long term current use of anticoagulant therapy 10/31/2014  . Renal insufficiency 10/31/2014  . Iron deficiency anemia due to chronic blood loss 10/30/2014  . Encounter for therapeutic drug monitoring 10/11/2014  . Pulmonary embolus ([place]) 09/27/2014  . ESRD on dialysis ([place]) 09/27/2014  . MGUS (monoclonal gammopathy of unknown significance) 09/27/2014  . OSA on CPAP 09/27/2014  . Diabetic retinopathy of both eyes ([place]) 02/03/2012  . Sickle cell trait ([place]) 07/19/2011  . Morbid obesity ([place]) 07/19/2011  . PE (pulmonary embolism) 07/18/2011  . SOB (shortness of breath) 07/18/2011  . Hyperkalemia 07/18/2011  . ARF (acute renal failure) ([place]) 07/18/2011  . Leukocytosis, unspecified 07/18/2011  . DM (diabetes mellitus) type II controlled with renal manifestation ([place]) 07/18/2011  . Cardiac enzymes elevated 07/18/2011  . Metabolic acidosis 76/81/1572  . HTN (hypertension) 07/18/2011    Past Medical History:  Past Medical History  Diagnosis Date  . Diabetes mellitus without complication   . Sickle cell-thalassemia disease     a. Sickle cell trait.  . Pulmonary embolism 07/2011    a. Tx with Coumadin for 6 months (unknown cause per patient).  . Hyperkalemia 07/2011  . Acute  renal failure 07/2011  . Monoclonal gammopathy   . CKD (chronic kidney disease)   . Sleep apnea     a. uses CPAP.  [name] Hypertension   . Sickle cell-thalassemia disease   . Gout   . Pulmonary embolism 07/2011; 09/27/2014    a. Bilat PE 07/2011 - unclear cause, tx with 6 months Coumadin.;   . Monoclonal gammopathies   . Anemia   . Type II diabetes mellitus   . Asthma   . OSA on CPAP   . Sickle-cell thalassemia   . CKD (chronic kidney disease), stage IV   . History of recent blood transfusion 10/27/14    2 Units PRBC's   Past Surgical History:  Past Surgical History  Procedure Laterality Date  . Cholecystectomy    . Bone marrow biopsy    . Other surgical history      Retinal surgery  . Pars plana vitrectomy  02/17/2012    Procedure: PARS PLANA VITRECTOMY WITH 25 GAUGE;  Surgeon: Hayden Pedro, MD;  Location: [place];  Service: Ophthalmology;  Laterality: Right;  . Cholecystectomy  1990's?  . Eye surgery Right     Assessment & Plan Clinical Impression: Patient is a 50 y.o. year old male with recent admission to the hospital on 09/17/15 with respiratory failure  Complicated by DVT, PE, and CVA.  Patient transferred to CIR on 10/12/2015 .   Patient currently requires max with mobility secondary to muscle weakness, decreased cardiorespiratoy endurance, impaired timing and sequencing, abnormal tone, unbalanced muscle activation and decreased motor planning and decreased awareness, decreased problem solving and delayed processing.  Prior to hospitalization, patient was independent  with mobility and lived with Alone (works alone in [place], but lives at home with wife) in a [place] (and house witrh wife has 1 STE) home.  Home access is  Elevator, Level entry.  Patient will benefit from skilled PT intervention to maximize safe functional mobility, minimize fall risk and decrease caregiver burden for planned discharge home with 24 hour assist.  Anticipate patient will benefit from follow up  [facility] at discharge.  PT - End of Session Activity Tolerance: Tolerates 30+ min activity with multiple rests Endurance Deficit: Yes Endurance Deficit Description: rest breaks PT Assessment Rehab Potential (ACUTE/IP ONLY): Fair PT Patient demonstrates impairments in the following area(s): Balance;Endurance;Motor;Safety;Perception;Sensory;Skin Integrity PT Transfers Functional Problem(s): Bed Mobility;Bed to Chair;Car;Furniture;Floor PT Locomotion Functional Problem(s): Ambulation;Wheelchair Mobility;Stairs PT Plan PT Intensity: Minimum of 1-2 x/day ,45 to 90 minutes PT Frequency: 5 out of 7 days PT Duration Estimated Length of Stay: 14 to 21 days PT Treatment/Interventions: Ambulation/gait training;DME/adaptive equipment instruction;Psychosocial support;UE/LE Strength taining/ROM;Balance/vestibular training;Functional electrical stimulation;Skin care/wound management;UE/LE Coordination activities;Cognitive remediation/compensation;Functional mobility training;Visual/perceptual remediation/compensation;Community reintegration;Neuromuscular re-education;Stair training;Wheelchair propulsion/positioning;Discharge planning;Therapeutic Activities;Disease management/prevention;Patient/family education;Therapeutic Exercise PT Transfers Anticipated Outcome(s): SBA PT Locomotion Anticipated Outcome(s): SBA 150' with [place] PT Recommendation Recommendations for Other Services: Neuropsych consult Follow Up Recommendations: None Patient destination: Home Equipment Recommended: Cane;Wheelchair (measurements);Wheelchair cushion (measurements);To be determined  Skilled Therapeutic Intervention Pt performs transfers x20. Pt performs static standing 2'x2, 1'x5. Pt performs static and dynamic standing blance with dual motor tasks including reaching, grasping, LE manipulation, and weight shifting activities 2x10. Pt perform weight shifting B/L and LLE advancement pre-gait x10. Pt and wife educated on rehab plan and  safety in mobility.  PT Evaluation Precautions/Restrictions Precautions Precautions: Fall Restrictions Weight Bearing Restrictions: No General Chart Reviewed: Yes Vital SignsTherapy Vitals Pulse Rate: 99 BP: (!) 132/92 mmHg Pain Pain Assessment Pain Assessment: No/denies pain Home Living/Prior Functioning Home Living Available Help at Discharge: Available 24 hours/day;Family Type of Home: Apartment (and house witrh wife has 1 STE) Home Access: Elevator;Level entry Home Layout: One level Bathroom Shower/Tub: Government social research officer Accessibility: Yes Additional Comments: wife and son in [place] for son to finish [employer]  Lives With: Alone (works alone in [place], but lives at home with wife) Prior Function Level of Independence: Independent with basic ADLs;Independent with homemaking with ambulation;Independent with gait;Independent with transfers  Able to Take Stairs?: Yes Vision/Perception  Vision - Assessment Eye Alignment: Within Functional Limits Alignment/Gaze Preference: Within Defined Limits Tracking/Visual Pursuits: Requires cues, head turns, or add eye shifts to track;Unable to hold eye position out of midline Saccades: Impaired - to be further tested in functional context Convergence: Impaired - to be further tested in functional context Diplopia Assessment: Other (comment) (difficult to determine due to expressive aphasia)  Cognition Overall Cognitive Status: Impaired/Different from baseline (utilized yes/no questions due to expressive aphasia) Arousal/Alertness: Awake/alert Attention: Sustained Sustained Attention: Appears intact Memory: Impaired Awareness: Impaired Awareness Impairment: Emergent impairment Problem Solving: Impaired Problem Solving Impairment: Functional basic Safety/Judgment: Appears intact Comments: follows multi unit commands Sensation Sensation Light Touch: Impaired by gross  assessment Coordination Gross Motor Movements are Fluid and Coordinated: No Fine Motor Movements are Fluid and Coordinated: No Finger Nose Finger Test: unable to complete with RUE; LUE apraxia Motor  Motor Motor: Motor apraxia;Hemiplegia;Abnormal postural alignment and control  Mobility Bed Mobility Bed Mobility: Supine to Sit Supine to Sit: 3: Mod assist Transfers Sit to Stand: 3: Mod assist Stand to Sit: 3: Mod assist Locomotion     Trunk/Postural Assessment  Cervical  Assessment Cervical Assessment: Exceptions to [facility] (forward head) Thoracic Assessment Thoracic Assessment: Exceptions to [facility] Lumbar Assessment Lumbar Assessment: Exceptions to [name] Postural Control Postural Control: Deficits on evaluation  Balance Balance Balance Assessed: Yes Dynamic Sitting Balance Dynamic Sitting - Balance Support: No upper extremity supported Dynamic Sitting - Level of Assistance: 4: Min assist Static Standing Balance Static Standing - Balance Support: Right upper extremity supported;Left upper extremity supported Static Standing - Level of Assistance: 2: Max assist Dynamic Standing Balance Dynamic Standing - Balance Support: During functional activity Dynamic Standing - Level of Assistance: 2: Max assist Extremity Assessment  RUE Assessment RUE Assessment: Exceptions to [name] (grossly 2/5 strength; minimal digit AROM) LUE Assessment LUE Assessment: Within Functional Limits RLE Assessment RLE Assessment: Exceptions to [facility] RLE AROM (degrees) Overall AROM Right Lower Extremity: Deficits RLE Overall AROM Comments: R hip flexion to 90, ankle DF to 5 degrees, knee ext 5 degrees RLE PROM (degrees) Overall PROM Right Lower Extremity: Deficits RLE Overall PROM Comments: grossly 3-/5 LLE Assessment LLE Assessment: Exceptions to WFL LLE AROM (degrees) Overall AROM Left Lower Extremity: Deficits LLE Overall AROM Comments: R knee ext to 5 degrees LLE PROM (degrees) Overall PROM Left Lower  Extremity: Deficits LLE Overall PROM Comments: grossly 3/5 t/o except L knee 3-/5   See Function Navigator for Current Functional Status.   Refer to Care Plan for Long Term Goals  Recommendations for other services: Neuropsych  Discharge Criteria: Patient will be discharged from PT if patient refuses treatment 3 consecutive times without medical reason, if treatment goals not met, if there is a change in medical status, if patient makes no progress towards goals or if patient is discharged from hospital.  The above assessment, treatment plan, treatment alternatives and goals were discussed and mutually agreed upon: by patient and by family  Monia Pouch 10/13/2015, 10:59 AM

## 2015-10-13 NOTE — Progress Notes (Signed)
[place] for Heparin  Indication: Hx PE/CVA/hx hypercoagulable state  Allergies  Allergen Reactions  . Codeine Other (See Comments)    Pt unknown  . Codeine Rash and Other (See Comments)    Unknown reaction (patient says it was more serious than just a rash, but he can't remember what happened)    Patient Measurements: Height: 5' 11"  (180.3 cm) Weight: 206 lb 5.6 oz (93.6 kg) IBW/kg (Calculated) : 75.3   Labs:  Recent Labs  10/12/15 1920 10/13/15 0540 10/13/15 1153 10/13/15 1515 10/13/15 2226  HGB 11.2* 10.6*  --   --   --   HCT 32.5* 31.0*  --   --   --   PLT 267 285  --   --   --   HEPARINUNFRC  --  0.37 0.18*  --  0.64  CREATININE 6.07*  --   --  6.42*  --     Estimated Creatinine Clearance: 15.9 mL/min (by C-G formula based on Cr of 6.42).  Assessment:  50 yo male with hx of PE and recent embolic CVAs for heparin  Goal of Therapy:  Heparin level 0.3-0.5 units/ml Monitor platelets by anticoagulation protocol: Yes   Plan:  Decrease Heparin 1400 units/hr Follow-up am labs.  Phillis Knack, PharmD, BCPS   10/13/2015,11:17 PM

## 2015-10-13 NOTE — Progress Notes (Signed)
Home MEDICATION NOTE   (Pharmacy Consult for Med - Rec)  Medications:  Medication Sig  . allopurinol  Take 300 mg by mouth daily.  [name] amLODipine  Take 10 mg by mouth daily.  [name] aspirin 81 MG chewable tablet Chew 81 mg by mouth daily.  [name] atorvastatin  Take 10 mg by mouth daily.  . budesonide-formoterol (SYMBICORT) 160-4.5 MCG/ACT inhaler Inhale 2 puffs into the lungs 2 (two) times daily.  . cetirizine  Take 10 mg by mouth daily.   . colchicine  Take 0.6 mg by mouth daily.  [name] enoxaparin  Inject 120 mg into the skin daily.  [name] epoetin alfa  Inject 5,000 Units into the skin once a week.   . Fluticasone Furoate-Vilanterol (BREO ELLIPTA) 100-25 MCG/INH AEPB Inhale 1 puff into the lungs daily.  . furosemide  Take 20 mg by mouth daily as needed for fluid.   [name] insulin glargine (LANTUS)  Inject 40 Units into the skin at bedtime.  . insulin lispro (HUMALOG)  Inject 10 Units into the skin 3 (three) times daily before meals.  [name] lisinopril  Take 10 mg by mouth daily.  . montelukast Take 10 mg by mouth at bedtime.   Assessment: Medications listed above were documented as home meds from [name] in [place].  We have been unable to confirm that the patient is currently taking these medications as prescribed.  Please review and order all that you feel appropriate for his ongoing therapy.  Rober Minion, PharmD., MS Clinical Pharmacist Pager:  [phone] Thank you for allowing pharmacy to be part of this patients care team. 10/13/2015,10:19 AM

## 2015-10-14 ENCOUNTER — Inpatient Hospital Stay (HOSPITAL_COMMUNITY): Payer: Self-pay | Admitting: Physical Therapy

## 2015-10-14 ENCOUNTER — Inpatient Hospital Stay (HOSPITAL_COMMUNITY): Payer: BLUE CROSS/BLUE SHIELD

## 2015-10-14 DIAGNOSIS — Z992 Dependence on renal dialysis: Secondary | ICD-10-CM

## 2015-10-14 DIAGNOSIS — I69359 Hemiplegia and hemiparesis following cerebral infarction affecting unspecified side: Secondary | ICD-10-CM

## 2015-10-14 DIAGNOSIS — N186 End stage renal disease: Secondary | ICD-10-CM

## 2015-10-14 DIAGNOSIS — N185 Chronic kidney disease, stage 5: Secondary | ICD-10-CM

## 2015-10-14 DIAGNOSIS — I69320 Aphasia following cerebral infarction: Secondary | ICD-10-CM

## 2015-10-14 DIAGNOSIS — I69391 Dysphagia following cerebral infarction: Secondary | ICD-10-CM

## 2015-10-14 LAB — URINALYSIS, ROUTINE W REFLEX MICROSCOPIC
Bilirubin Urine: NEGATIVE
Glucose, UA: 500 mg/dL — AB
Hgb urine dipstick: NEGATIVE
Ketones, ur: NEGATIVE mg/dL
Leukocytes, UA: NEGATIVE
Nitrite: NEGATIVE
Protein, ur: 100 mg/dL — AB
Specific Gravity, Urine: 1.011 (ref 1.005–1.030)
pH: 6 (ref 5.0–8.0)

## 2015-10-14 LAB — URINE MICROSCOPIC-ADD ON
Bacteria, UA: NONE SEEN
RBC / HPF: NONE SEEN RBC/hpf (ref 0–5)
WBC, UA: NONE SEEN WBC/hpf (ref 0–5)

## 2015-10-14 LAB — HEPATITIS B SURFACE ANTIGEN: Hepatitis B Surface Ag: NEGATIVE

## 2015-10-14 LAB — HEPATITIS B SURFACE ANTIBODY,QUALITATIVE: Hep B S Ab: NONREACTIVE

## 2015-10-14 LAB — GLUCOSE, CAPILLARY
Glucose-Capillary: 255 mg/dL — ABNORMAL HIGH (ref 65–99)
Glucose-Capillary: 240 mg/dL — ABNORMAL HIGH (ref 65–99)
Glucose-Capillary: 292 mg/dL — ABNORMAL HIGH (ref 65–99)
Glucose-Capillary: 301 mg/dL — ABNORMAL HIGH (ref 65–99)
Glucose-Capillary: 280 mg/dL — ABNORMAL HIGH (ref 65–99)

## 2015-10-14 LAB — CBC
HCT: 32.4 % — ABNORMAL LOW (ref 39.0–52.0)
Hemoglobin: 11 g/dL — ABNORMAL LOW (ref 13.0–17.0)
MCH: 29.5 pg (ref 26.0–34.0)
MCHC: 34 g/dL (ref 30.0–36.0)
MCV: 86.9 fL (ref 78.0–100.0)
Platelets: 335 10*3/uL (ref 150–400)
RBC: 3.73 MIL/uL — ABNORMAL LOW (ref 4.22–5.81)
RDW: 18.4 % — ABNORMAL HIGH (ref 11.5–15.5)
WBC: 16.1 10*3/uL — ABNORMAL HIGH (ref 4.0–10.5)

## 2015-10-14 LAB — HEPATITIS B CORE ANTIBODY, TOTAL: Hep B Core Total Ab: NEGATIVE

## 2015-10-14 LAB — HEPARIN LEVEL (UNFRACTIONATED): Heparin Unfractionated: 0.39 IU/mL (ref 0.30–0.70)

## 2015-10-14 MED ORDER — INSULIN DETEMIR 100 UNIT/ML ~~LOC~~ SOLN
20.0000 [IU] | Freq: Every day | SUBCUTANEOUS | Status: DC
  Administered 2015-10-14 – 2015-10-30 (×17): 20 [IU] via SUBCUTANEOUS
  Filled 2015-10-14 (×19): qty 0.2

## 2015-10-14 NOTE — Progress Notes (Signed)
Nursing Note: Pt back from HD.Pt tolerated treatment well per report.Pt resting quietly in bed.T-98 P-102 R-17 BP- 129/89 PO2 99% on r/a.Wife at bedside and is feeding him supper.CBG-134 mg/dl.wbb

## 2015-10-14 NOTE — Consult Note (Signed)
[facility] [place] HISTORY AND PHYSICAL   Referring Physician: Elmarie Shiley MD History of Present Illness:  Patient is a 50 y.o.  male who presents for evaluation for permanent access.  Pt recently admitted to rehab after acute embolic stroke thought to be due to paradoxical embolus from DVT.  He is currently aphasic with right hemiplegia.  Stroke was around 09/17/15.  He currently is dialyzing via a right side tunneled catheter but is thought to have no renal recovery.  Other medical problems include PE 2012, sleep apnea, hypertension, DM all of which have been stable.  He is currently on heparin pending hematology eval for long term anticoagulation.  He is right handed but has significant right UE deficit currently.  He is able to comprehend and answer yes no questions.  Past Medical History  Diagnosis Date  . Diabetes mellitus without complication   . Sickle cell-thalassemia disease     a. Sickle cell trait.  . Pulmonary embolism 07/2011    a. Tx with Coumadin for 6 months (unknown cause per patient).  . Hyperkalemia 07/2011  . Acute renal failure 07/2011  . Monoclonal gammopathy   . CKD (chronic kidney disease)   . Sleep apnea     a. uses CPAP.  [name] Hypertension   . Sickle cell-thalassemia disease   . Gout   . Pulmonary embolism 07/2011; 09/27/2014    a. Bilat PE 07/2011 - unclear cause, tx with 6 months Coumadin.;   . Monoclonal gammopathies   . Anemia   . Type II diabetes mellitus   . Asthma   . OSA on CPAP   . Sickle-cell thalassemia   . CKD (chronic kidney disease), stage IV   . History of recent blood transfusion 10/27/14    2 Units PRBC's    Past Surgical History  Procedure Laterality Date  . Cholecystectomy    . Bone marrow biopsy    . Other surgical history      Retinal surgery  . Pars plana vitrectomy  02/17/2012    Procedure: PARS PLANA VITRECTOMY WITH 25 GAUGE;  Surgeon: Hayden Pedro, MD;  Location: [facility];  Service: Ophthalmology;  Laterality:  Right;  . Cholecystectomy  1990's?  . Eye surgery Right     Social History Social History  Substance Use Topics  . Smoking status: Never Smoker   . Smokeless tobacco: Never Used  . Alcohol Use: No    Family History Family History  Problem Relation Age of Onset  . Hypertension Mother   . Diabetes Mother   . Sickle cell anemia Brother   . Diabetes type I Brother   . Kidney disease Brother     Allergies  Allergies  Allergen Reactions  . Codeine Other (See Comments)    Pt unknown  . Codeine Rash and Other (See Comments)    Unknown reaction (patient says it was more serious than just a rash, but he can't remember what happened)     Current Facility-Administered Medications  Medication Dose Route Frequency Provider Last Rate Last Dose  . 0.9 %  sodium chloride infusion  100 mL Intravenous PRN Elmarie Shiley, MD      . 0.9 %  sodium chloride infusion  100 mL Intravenous PRN Elmarie Shiley, MD      . acetaminophen (TYLENOL) tablet 325-650 mg  325-650 mg Oral Q4H PRN Ivan Anchors Love, PA-C      . alteplase (CATHFLO ACTIVASE) injection 2 mg  2 mg Intracatheter Once PRN  Elmarie Shiley, MD      . antiseptic oral rinse (CPC / CETYLPYRIDINIUM CHLORIDE 0.05%) solution 7 mL  7 mL Mouth Rinse BID Charlett Blake, MD   7 mL at 10/14/15 0800  . aspirin EC tablet 81 mg  81 mg Oral Daily Bary Leriche, PA-C   81 mg at 10/14/15 0859  . bisacodyl (DULCOLAX) EC tablet 5 mg  5 mg Oral Daily PRN Bary Leriche, PA-C      . diphenhydrAMINE (BENADRYL) 12.5 MG/5ML elixir 12.5-25 mg  12.5-25 mg Oral Q6H PRN Bary Leriche, PA-C      . Fluticasone Furoate-Vilanterol 200-25 MCG/INH AEPB 1 puff  1 puff Inhalation Daily Charlett Blake, MD   1 puff at 10/14/15 0843  . heparin ADULT infusion 100 units/mL (25000 units/250 mL)  1,400 Units/hr Intravenous Continuous Charlett Blake, MD 14 mL/hr at 10/14/15 1127 1,400 Units/hr at 10/14/15 1127  . heparin injection 1,000 Units  1,000 Units Dialysis PRN Elmarie Shiley, MD       . insulin aspart (novoLOG) injection 0-5 Units  0-5 Units Subcutaneous QHS Bary Leriche, PA-C   2 Units at 10/13/15 2256  . insulin aspart (novoLOG) injection 0-9 Units  0-9 Units Subcutaneous TID WC Bary Leriche, PA-C   5 Units at 10/14/15 1246  . insulin detemir (LEVEMIR) injection 20 Units  20 Units Subcutaneous QHS Charlett Blake, MD      . ipratropium-albuterol (DUONEB) 0.5-2.5 (3) MG/3ML nebulizer solution 3 mL  3 mL Nebulization Q4H PRN Artemio Aly, MD      . lidocaine (PF) (XYLOCAINE) 1 % injection 5 mL  5 mL Intradermal PRN Elmarie Shiley, MD      . lidocaine-prilocaine (EMLA) cream 1 application  1 application Topical PRN Elmarie Shiley, MD      . midodrine (PROAMATINE) tablet 5 mg  5 mg Oral TID WC Ivan Anchors Love, PA-C   5 mg at 10/14/15 1247  . montelukast (SINGULAIR) tablet 10 mg  10 mg Oral QHS Ivan Anchors Love, PA-C   10 mg at 10/13/15 2255  . pantoprazole (PROTONIX) EC tablet 40 mg  40 mg Oral BID Bary Leriche, PA-C   40 mg at 10/14/15 0859  . pentafluoroprop-tetrafluoroeth (GEBAUERS) aerosol 1 application  1 application Topical PRN Elmarie Shiley, MD      . senna-docusate (Senokot-S) tablet 1 tablet  1 tablet Oral QHS PRN Bary Leriche, PA-C      . simethicone [facility]) chewable tablet 80 mg  80 mg Oral QID PRN Bary Leriche, PA-C      . traMADol Veatrice [facility]bon) tablet 50 mg  50 mg Oral Q12H PRN Bary Leriche, PA-C      . traZODone (DESYREL) tablet 25-50 mg  25-50 mg Oral QHS PRN Ivan Anchors Love, PA-C        ROS:   General:  No weight loss, Fever, chills  HEENT: No recent headaches, no nasal bleeding, no visual changes, no sore throat  Neurologic: No dizziness, blackouts, seizures. + recent symptoms of stroke or mini- stroke. + recent episodes of slurred speech, or temporary blindness.  Cardiac: No recent episodes of chest pain/pressure, no shortness of breath at rest. + shortness of breath with exertion.  Denies history of atrial fibrillation or irregular heartbeat  Vascular: No history of  rest pain in feet.  No history of claudication.  No history of non-healing ulcer, + history of DVT   Pulmonary: Home CPAP, no productive cough,  no hemoptysis,  No asthma or wheezing  Musculoskeletal:  _0  Arthritis, _1  Low back pain,  _2  Joint pain  Hematologic:? history of hypercoagulable state.  No history of easy bleeding.  No history of anemia  Gastrointestinal: No hematochezia or melena,  No gastroesophageal reflux, no trouble swallowing  Urinary: [x ] chronic Kidney disease, _3  on HD - _4  MWF or _5  TTHS, _6  Burning with urination, _7  Frequent urination, _8  Difficulty urinating;   Skin: No rashes  Psychological: No history of anxiety,  No history of depression   Physical Examination  Filed Vitals:   10/13/15 2031 10/14/15 0144 10/14/15 0648 10/14/15 0843  BP: 129/89 100/60 131/84   Pulse: 102 100 100   Temp: 98 F (36.7 C)  97.8 F (36.6 C)   TempSrc: Oral  Oral   Resp: 17  18   Height:      Weight:      SpO2: 99%  100% 100%    Body mass index is 28.79 kg/(m^2).  General:  Alert and oriented, no acute distress HEENT: Normal Neck: No JVD right side catheter Pulmonary: Clear to auscultation bilaterally Cardiac: Regular Rate and Rhythm Abdomen: Soft, non-tender, non-distended Skin: No rash Extremity Pulses:  2+ radial, brachial pulses bilaterally Musculoskeletal: No deformity or edema  Neurologic: Upper and lower extremity motor 5/5 left side, right side 0-2/5  DATA:  Vein map shows cephalic vein greater than 3 mm bilaterally wrist to upper arm   ASSESSMENT:  Pt needs long term hemodialysis access.  Will schedule outpt appt to see him again in about a month of stroke recovery.  At that time most likely schedule right radial cephalic AVF.   PLAN:  Avoid all further needlesticks BP or IVs right arm.  Please remove right hand IV.  Will arrange follow up outpt appt in 1 month  Ruta Hinds, MD [facility] [place] Office:  [phone] Pager: [phone]

## 2015-10-14 NOTE — Progress Notes (Signed)
[place] for Heparin  Indication: Hx PE/CVA/hx hypercoagulable state  Allergies  Allergen Reactions  . Codeine Other (See Comments)    Pt unknown  . Codeine Rash and Other (See Comments)    Unknown reaction (patient says it was more serious than just a rash, but he can't remember what happened)    Patient Measurements: Height: 5' 11"  (180.3 cm) Weight: 206 lb 5.6 oz (93.6 kg) IBW/kg (Calculated) : 75.3   Labs:  Recent Labs  10/12/15 1920  10/13/15 0540 10/13/15 1153 10/13/15 1515 10/13/15 2226 10/14/15 0945  HGB 11.2*  --  10.6*  --   --   --  11.0*  HCT 32.5*  --  31.0*  --   --   --  32.4*  PLT 267  --  285  --   --   --  335  HEPARINUNFRC  --   < > 0.37 0.18*  --  0.64 0.39  CREATININE 6.07*  --   --   --  6.42*  --   --   < > = values in this interval not displayed.  Estimated Creatinine Clearance: 15.9 mL/min (by C-G formula based on Cr of 6.42).  Assessment:  50 yo male with hx of PE and recent embolic CVAs for heparin HL therapeutic at 0.39.  Hgb 11, Plts 335. No s/sx bleeding noted  Goal of Therapy:  Heparin level 0.3-0.5 units/ml Monitor platelets by anticoagulation protocol: Yes   Plan:  -Continue Heparin gtt at 1400 units/hr -Daily HL, CBC -Monitor for s/sx of bleeding   Bennye Alm, PharmD Pharmacy Resident [phone] 10/14/2015,11:18 AM

## 2015-10-14 NOTE — Progress Notes (Addendum)
At [place] patient bladder scanned for 583. Patient unable to void. Patient cathed for 600 and urine sample sent. Continue to monitor

## 2015-10-14 NOTE — Progress Notes (Signed)
Patient ID: [place], male   DOB: Aug 09, 1966, 50 y.o.   MRN: [id number]  [facility] Progress Note   Assessment/ Plan:   1. Status post embolic CVA/right-sided hemiparesis/facial droop with dysarthria versus aphasia: Transfer to inpatient rehabilitation unit here on Friday night after transfer from [place], [place] where he was admitted. Ongoing assessment and management per PM and R 2. Progressive chronic kidney disease-now likely end-stage renal disease: With prior history of progressive chronic kidney disease and now evolution to ESRD after suffering significant acute renal failure. Status post hemodialysis yesterday and we will continue a Tuesday/Thursday/Saturday dialysis schedule while he is here. I spoke to Dr. Oneida Alar of vascular surgery yesterday regarding permanent dialysis access evaluation/placement planning at some point during this hospitalization- vein mapping pending. He will need to recover to a point where he can sit in a recliner for hemodialysis (4 hours) prior to being discharged to an outpatient dialysis unit. 3. Anemia of chronic kidney disease: Hemoglobin currently at goal, iron stores appear to be replete. Continue to monitor for ESA needs.  4. Metabolic bone disease: calcium and phosphorus levels are acceptable- he is not on phosphorus binder.  5. Recent PE/DVT: On anticoagulation with heparin drip, awaiting hematology input into chronic anticoagulation (recent GI bleed). 6. Monoclonal gammopathy of undetermined significance: Anticipate follow-up to continue with hematology locally.  Subjective:   Had a good night and tolerated HD without problems   Objective:   BP 131/84 mmHg  Pulse 100  Temp(Src) 97.8 F (36.6 C) (Oral)  Resp 18  Ht 5' 11"  (1.803 m)  Wt 93.6 kg (206 lb 5.6 oz)  BMI 28.79 kg/m2  SpO2 100%  Physical Exam: Gen: Comfortably resting in bed  BWL:SLHTDSK tachycardia, 3/6 holosystolic murmur over left lateral sternal border Resp: Clear  to auscultation, no rales/rhonchi, right IJ TDC Abd: Soft, flat, nontender Ext: Trace ankle edema   Labs: BMET  Recent Labs Lab 10/12/15 1920 10/13/15 1515  NA 132* 129*  K 5.2* 4.7  CL 96* 94*  CO2 20* 23  GLUCOSE 318* 426*  BUN 42* 52*  CREATININE 6.07* 6.42*  CALCIUM 9.2 9.1  PHOS 3.7 3.0   CBC  Recent Labs Lab 10/12/15 1920 10/13/15 0540 10/14/15 0945  WBC 16.2* 15.5* 16.1*  HGB 11.2* 10.6* 11.0*  HCT 32.5* 31.0* 32.4*  MCV 86.2 85.9 86.9  PLT 267 285 335    Medications:    . antiseptic oral rinse  7 mL Mouth Rinse BID  . aspirin EC  81 mg Oral Daily  . Fluticasone Furoate-Vilanterol  1 puff Inhalation Daily  . insulin aspart  0-5 Units Subcutaneous QHS  . insulin aspart  0-9 Units Subcutaneous TID WC  . insulin detemir  15 Units Subcutaneous QHS  . midodrine  5 mg Oral TID WC  . montelukast  10 mg Oral QHS  . pantoprazole  40 mg Oral BID   Elmarie Shiley, MD 10/14/2015, 9:51 AM

## 2015-10-14 NOTE — Progress Notes (Signed)
Physical Therapy Session Note  Patient Details  Name: Luke Falero [name] MRN: [id number] Date of Birth: 1966-01-05  Today's Date: 10/14/2015 PT Individual Time: 1330-1430 PT Individual Time Calculation (min): 60 min   Short Term Goals: Week 1:  PT Short Term Goal 1 (Week 1): Pt will perform bed mobility mod A and LRAD PT Short Term Goal 2 (Week 1): Pt will perform transfers with mod A and LRAD PT Short Term Goal 3 (Week 1): Pt will initiate gait    Skilled Therapeutic Interventions/Progress Updates:   Pt demonstrates improvement initiating gait in session. Pt benefits from cues for R lateral L/S flexors for RLE stability and advancement in gait. Pt also with improving transfers with improving carryover of cues for weight shift.  Pt would continue to benefit from skilled PT services to increase functional mobility.  Therapy Documentation Precautions:  Precautions Precautions: Fall Restrictions Weight Bearing Restrictions: No Pain: Pain Assessment Pain Assessment: No/denies pain Mobility:  Mod A with cues for weight shift, and posture Locomotion :   Free gait with Max A and w/c follow 5' and 20' with cues for sequencing, R lateral L/S flexor activation, and posture Other Treatments:  Pt educated on rehab plan, safety in mobility, weight shift, and posture. Pt performs static standing 30"x6 in session. Pt performs BLE advancement, B/L weight shifting 2x10. Pec stretch and T/S ext stretch 2x30". Pt performs scap retraction x15. RUE AAROM 2x10 at shoulder, elbow and wrist. Pt performs transfers x10 in session. Pt situated in neutral alignment at end of session.   See Function Navigator for Current Functional Status.   Therapy/Group: Individual Therapy  Monia Pouch 10/14/2015, 1:55 PM

## 2015-10-14 NOTE — Progress Notes (Signed)
VASCULAR LAB PRELIMINARY  PRELIMINARY  PRELIMINARY  PRELIMINARY  Right  Upper Extremity Vein Map    Cephalic  Segment Diameter Depth Comment  1. Axilla 3.74m 17.166m  2. Mid upper arm 2.97293m.89m57m3. Above AC 2.7mm 29m8mm 64m In AC 4.[facility] 663[facility]   40melow AC 3.7mm 5.2568mBran13m.69mm  6. M70morearm 3.74mm 3.93mm30m. Wr58m3.24mm 3.47mm  46m mm 82mm mm    mm mm    Basilic  Segment Diameter Depth Comment  1. Axilla mm mm   2. Mid upper arm 3.49mm mm   3. Ab19mAC 3.97mm mm   4. In 393mm mm   5. Below AC 2.74mm mm   6. Mid 56marm mm mm Small branches  7. Wrist mm mm    mm mm    mm mm    mm mm    Left Upper Extremity Vein Map    Cephalic  Segment Diameter Depth Comment  1. Axilla 3.52mm 15.8mm   2. M46mpper 30m 4.02mm 5.61mm   3. Abo46mC 3.19m 7.393mm   4. [facility] AC16m9mm 6mmm   5. [facility] A49m83mm893m9mm   6. Mid forea50m.69m32m52mm Branch 1.46mm  721mist 62mmm 3.97mm  42m mm    mm mm  23m mm 37masilic  Segment Diameter Depth Comment  1. Axilla 3.16mm 26mm   2. Mid upper arm 3.5927m93m393m3. Above AC 4.44mm 15.169m 448m AC mm mm   [facility] 2.958m 2.86mm   6. Mid forearm 1.97mm 2.324m  7.49mst 1.64mm 1.32mm    m28m    38mm    mm mm    C71mne,H36me, RVT 10/14/2015, 11:19 AM

## 2015-10-14 NOTE — Progress Notes (Signed)
Subjective/Complaints: Remains aphasic  ROS- cannot complete, aphasic  Objective: Vital Signs: Blood pressure 131/84, pulse 100, temperature 97.8 F (36.6 C), temperature source Oral, resp. rate 18, height 5' 11" (1.803 m), weight 93.6 kg (206 lb 5.6 oz), SpO2 100 %. No results found. Results for orders placed or performed during the hospital encounter of 10/12/15 (from the past 72 hour(s))  Glucose, capillary     Status: Abnormal   Collection Time: 10/12/15  6:16 PM  Result Value Ref Range   Glucose-Capillary 289 (H) 65 - 99 mg/dL  CBC     Status: Abnormal   Collection Time: 10/12/15  7:20 PM  Result Value Ref Range   WBC 16.2 (H) 4.0 - 10.5 K/uL   RBC 3.77 (L) 4.22 - 5.81 MIL/uL   Hemoglobin 11.2 (L) 13.0 - 17.0 g/dL   HCT 32.5 (L) 39.0 - 52.0 %   MCV 86.2 78.0 - 100.0 fL   MCH 29.7 26.0 - 34.0 pg   MCHC 34.5 30.0 - 36.0 g/dL   RDW 18.8 (H) 11.5 - 15.5 %   Platelets 267 150 - 400 K/uL  Renal function panel     Status: Abnormal   Collection Time: 10/12/15  7:20 PM  Result Value Ref Range   Sodium 132 (L) 135 - 145 mmol/L   Potassium 5.2 (H) 3.5 - 5.1 mmol/L   Chloride 96 (L) 101 - 111 mmol/L   CO2 20 (L) 22 - 32 mmol/L   Glucose, Bld 318 (H) 65 - 99 mg/dL   BUN 42 (H) 6 - 20 mg/dL   Creatinine, Ser 6.07 (H) 0.61 - 1.24 mg/dL   Calcium 9.2 8.9 - 10.3 mg/dL   Phosphorus 3.7 2.5 - 4.6 mg/dL   Albumin 3.0 (L) 3.5 - 5.0 g/dL   GFR calc non Af Amer 10 (L) >60 mL/min   GFR calc Af Amer 11 (L) >60 mL/min    Comment: (NOTE) The eGFR has been calculated using the CKD EPI equation. This calculation has not been validated in all clinical situations. eGFR's persistently <60 mL/min signify possible Chronic Kidney Disease.    Anion gap 16 (H) 5 - 15  Glucose, capillary     Status: Abnormal   Collection Time: 10/12/15  9:18 PM  Result Value Ref Range   Glucose-Capillary 420 (H) 65 - 99 mg/dL   Comment 1 Notify RN   Heparin level (unfractionated)     Status: None   Collection  Time: 10/13/15  5:40 AM  Result Value Ref Range   Heparin Unfractionated 0.37 0.30 - 0.70 IU/mL    Comment:        IF HEPARIN RESULTS ARE BELOW EXPECTED VALUES, AND PATIENT DOSAGE HAS BEEN CONFIRMED, SUGGEST FOLLOW UP TESTING OF ANTITHROMBIN III LEVELS.   CBC     Status: Abnormal   Collection Time: 10/13/15  5:40 AM  Result Value Ref Range   WBC 15.5 (H) 4.0 - 10.5 K/uL   RBC 3.61 (L) 4.22 - 5.81 MIL/uL   Hemoglobin 10.6 (L) 13.0 - 17.0 g/dL   HCT 31.0 (L) 39.0 - 52.0 %   MCV 85.9 78.0 - 100.0 fL   MCH 29.4 26.0 - 34.0 pg   MCHC 34.2 30.0 - 36.0 g/dL   RDW 18.7 (H) 11.5 - 15.5 %   Platelets 285 150 - 400 K/uL  Glucose, capillary     Status: Abnormal   Collection Time: 10/13/15  6:41 AM  Result Value Ref Range   Glucose-Capillary 235 (H) 65 -  99 mg/dL   Comment 1 Notify RN   Heparin level (unfractionated)     Status: Abnormal   Collection Time: 10/13/15 11:53 AM  Result Value Ref Range   Heparin Unfractionated 0.18 (L) 0.30 - 0.70 IU/mL    Comment:        IF HEPARIN RESULTS ARE BELOW EXPECTED VALUES, AND PATIENT DOSAGE HAS BEEN CONFIRMED, SUGGEST FOLLOW UP TESTING OF ANTITHROMBIN III LEVELS.   Glucose, capillary     Status: Abnormal   Collection Time: 10/13/15 12:15 PM  Result Value Ref Range   Glucose-Capillary 360 (H) 65 - 99 mg/dL  Renal function panel     Status: Abnormal   Collection Time: 10/13/15  3:15 PM  Result Value Ref Range   Sodium 129 (L) 135 - 145 mmol/L   Potassium 4.7 3.5 - 5.1 mmol/L   Chloride 94 (L) 101 - 111 mmol/L   CO2 23 22 - 32 mmol/L   Glucose, Bld 426 (H) 65 - 99 mg/dL   BUN 52 (H) 6 - 20 mg/dL   Creatinine, Ser 6.42 (H) 0.61 - 1.24 mg/dL   Calcium 9.1 8.9 - 10.3 mg/dL   Phosphorus 3.0 2.5 - 4.6 mg/dL   Albumin 2.7 (L) 3.5 - 5.0 g/dL   GFR calc non Af Amer 9 (L) >60 mL/min   GFR calc Af Amer 10 (L) >60 mL/min    Comment: (NOTE) The eGFR has been calculated using the CKD EPI equation. This calculation has not been validated in all  clinical situations. eGFR's persistently <60 mL/min signify possible Chronic Kidney Disease.    Anion gap 12 5 - 15  Hepatitis B surface antigen     Status: None   Collection Time: 10/13/15  3:15 PM  Result Value Ref Range   Hepatitis B Surface Ag Negative Negative    Comment: (NOTE) Performed At: [facility] [place], [place] [id number] Lindon Romp MD [id number]   Hepatitis B surface antibody     Status: None   Collection Time: 10/13/15  3:15 PM  Result Value Ref Range   Hep B S Ab Non Reactive     Comment: (NOTE)              Non Reactive: Inconsistent with immunity,                            less than 10 mIU/mL              Reactive:     Consistent with immunity,                            greater than 9.9 mIU/mL Performed At: [facility] [place], [place] [id number] Lindon Romp MD [id number]   Hepatitis B core antibody, total     Status: None   Collection Time: 10/13/15  3:15 PM  Result Value Ref Range   Hep B Core Total Ab Negative Negative    Comment: (NOTE) Performed At: [facility] [address] [place], [place] [id number] Lindon Romp MD [id number]   Glucose, capillary     Status: Abnormal   Collection Time: 10/13/15  8:22 PM  Result Value Ref Range   Glucose-Capillary 134 (H) 65 - 99 mg/dL  Glucose, capillary     Status: Abnormal   Collection Time: 10/13/15  9:21 PM  Result Value Ref Range  Glucose-Capillary 205 (H) 65 - 99 mg/dL  Heparin level (unfractionated)     Status: None   Collection Time: 10/13/15 10:26 PM  Result Value Ref Range   Heparin Unfractionated 0.64 0.30 - 0.70 IU/mL    Comment:        IF HEPARIN RESULTS ARE BELOW EXPECTED VALUES, AND PATIENT DOSAGE HAS BEEN CONFIRMED, SUGGEST FOLLOW UP TESTING OF ANTITHROMBIN III LEVELS.   Glucose, capillary     Status: Abnormal   Collection Time: 10/14/15  6:40 AM  Result Value Ref Range   Glucose-Capillary 255 (H)  65 - 99 mg/dL  Glucose, capillary     Status: Abnormal   Collection Time: 10/14/15  9:01 AM  Result Value Ref Range   Glucose-Capillary 240 (H) 65 - 99 mg/dL  CBC     Status: Abnormal   Collection Time: 10/14/15  9:45 AM  Result Value Ref Range   WBC 16.1 (H) 4.0 - 10.5 K/uL   RBC 3.73 (L) 4.22 - 5.81 MIL/uL   Hemoglobin 11.0 (L) 13.0 - 17.0 g/dL   HCT 32.4 (L) 39.0 - 52.0 %   MCV 86.9 78.0 - 100.0 fL   MCH 29.5 26.0 - 34.0 pg   MCHC 34.0 30.0 - 36.0 g/dL   RDW 18.4 (H) 11.5 - 15.5 %   Platelets 335 150 - 400 K/uL      General: No acute distress Mood and affect are appropriate Heart: Regular rate and rhythm no rubs murmurs or extra sounds Lungs: Clear to auscultation, breathing unlabored, no rales or wheezes Abdomen: Positive bowel sounds, soft nontender to palpation, nondistended Extremities: No clubbing, cyanosis, or edema Skin: No evidence of breakdown, no evidence of rash Neurologic: Cranial nerves II through XII intact, motor strength is 5/5 in left deltoid, bicep, tricep, grip, hip flexor, knee extensors, ankle dorsiflexor and plantar flexor 2 minus in the right deltoid, biceps, triceps, grip, 3 minus left hip flexor and extensor, 2 minus left ankle dorsiflexor Sensory examDifficult to assess secondary to a aphasia, does feel pinch in all 4 limbs Cerebellar exam normal finger to nose to finger as well as heel to shin in bilateral upper and lower extremities Musculoskeletal: Full range of motion in all 4 extremities. No joint swelling   Assessment/Plan: 1. 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 Physiatrist is providing close team supervision and 24 hour management of active medical problems listed below. Physiatrist and rehab team continue to assess barriers to discharge/monitor patient progress toward  functional and medical goals. FIM: Function - Bathing Body parts bathed by patient: Right arm, Chest, Abdomen, Front perineal area, Right upper leg, Left upper leg Body parts bathed by helper: Left arm, Buttocks, Right lower leg, Left lower leg, Back Assist Level: Touching or steadying assistance(Pt > 75%)  Function- Upper Body Dressing/Undressing What is the patient wearing?: Pull over shirt/dress Pull over shirt/dress - Perfomed by patient: Thread/unthread left sleeve, Put head through opening Pull over shirt/dress - Perfomed by helper: Thread/unthread right sleeve, Pull shirt over trunk Assist Level: Touching or steadying assistance(Pt > 75%) Function - Lower Body Dressing/Undressing What is the patient wearing?: Non-skid slipper socks, Pants Pants- Performed by helper: Thread/unthread right pants leg, Thread/unthread left pants leg, Pull pants up/down Non-skid slipper socks- Performed by helper: Don/doff right sock, Don/doff left sock Assist for footwear: Maximal assist Assist for lower body dressing: Touching or steadying  assistance (Pt > 75%)        Function - Chair/bed transfer Chair/bed transfer assist level: 2 helpers  Function - Locomotion: Wheelchair Max wheelchair distance: 5' Assist Level: Maximal assistance (Pt 25 - 49%) Function - Locomotion: Ambulation Ambulation activity did not occur: Safety/medical concerns  Function - Comprehension Comprehension: Auditory Comprehension assist level: Understands basic 75 - 89% of the time/ requires cueing 10 - 24% of the time  Function - Expression Expression: Verbal Expression assist level: Expresses basic 25 - 49% of the time/requires cueing 50 - 75% of the time. Uses single words/gestures.  Function - Social Interaction Social Interaction assist level: Interacts appropriately 75 - 89% of the time - Needs redirection for appropriate language or to initiate interaction.  Function - Problem Solving Problem solving assist  level: Solves basic 25 - 49% of the time - needs direction more than half the time to initiate, plan or complete simple activities  Function - Memory Memory assist level: Recognizes or recalls 50 - 74% of the time/requires cueing 25 - 49% of the time Patient normally able to recall (first 3 days only): That he or she is in a hospital, Current season  Medical Problem List and Plan: 1. Right hemiparesis and aphasia secondary to bi-cerebral embolic infarcts (L>R)- Initiate rehabilitation program 2. PE/DVT/Stoke/Anticoagulation: Pharmaceutical: Lovenox bid per records from [place].  use iv heparin gtt at this time. per hematologist Dr Alen Blew , Long-term anticoagulation will be decided next week 3. Pain Management: Ultram prn.  4. Mood: LCSW to follow for evaluation and support.  5. Neuropsych: This patient is not fully capable of making decisions on his own behalf. 6. Skin/Wound Care: Routine pressure relief measures.  7. Fluids/Electrolytes/Nutrition: Renal restrictions. 1200 cc/FR.  8. DM type 2: Monitor BS ac/hs. Continue levemir and use SSI for elevated BS. 9. HTN: Monitor BP bid and titrate medications as needed. Elevated at admission. Will set parameters on Midodrine.  10. ESRD on HD: per [facility]. HD tomorrow after therapies. Continue diet/volume mgt, Tolerated first dialysis treatment here On 10/13/2015 11. Leukocytosis will check UA,May be difficult to get adequate sample secondary to renal failure no breathing difficulties or coughing. Afebrile, LOS (Days) 2 A FACE TO FACE EVALUATION WAS PERFORMED  KIRSTEINS,ANDREW E 10/14/2015, 9:53 AM

## 2015-10-15 ENCOUNTER — Inpatient Hospital Stay (HOSPITAL_COMMUNITY): Payer: BLUE CROSS/BLUE SHIELD | Admitting: *Deleted

## 2015-10-15 ENCOUNTER — Inpatient Hospital Stay (HOSPITAL_COMMUNITY): Payer: BLUE CROSS/BLUE SHIELD | Admitting: Occupational Therapy

## 2015-10-15 ENCOUNTER — Inpatient Hospital Stay (HOSPITAL_COMMUNITY): Payer: BLUE CROSS/BLUE SHIELD

## 2015-10-15 ENCOUNTER — Inpatient Hospital Stay (HOSPITAL_COMMUNITY): Payer: BLUE CROSS/BLUE SHIELD | Admitting: Speech Pathology

## 2015-10-15 ENCOUNTER — Other Ambulatory Visit: Payer: Self-pay | Admitting: *Deleted

## 2015-10-15 DIAGNOSIS — N19 Unspecified kidney failure: Secondary | ICD-10-CM

## 2015-10-15 DIAGNOSIS — Z01810 Encounter for preprocedural cardiovascular examination: Secondary | ICD-10-CM

## 2015-10-15 DIAGNOSIS — I638 Other cerebral infarction: Secondary | ICD-10-CM

## 2015-10-15 DIAGNOSIS — E8809 Other disorders of plasma-protein metabolism, not elsewhere classified: Secondary | ICD-10-CM

## 2015-10-15 DIAGNOSIS — N186 End stage renal disease: Secondary | ICD-10-CM

## 2015-10-15 DIAGNOSIS — I82401 Acute embolism and thrombosis of unspecified deep veins of right lower extremity: Secondary | ICD-10-CM

## 2015-10-15 LAB — GLUCOSE, CAPILLARY
Glucose-Capillary: 142 mg/dL — ABNORMAL HIGH (ref 65–99)
Glucose-Capillary: 158 mg/dL — ABNORMAL HIGH (ref 65–99)
Glucose-Capillary: 203 mg/dL — ABNORMAL HIGH (ref 65–99)
Glucose-Capillary: 303 mg/dL — ABNORMAL HIGH (ref 65–99)
Glucose-Capillary: 287 mg/dL — ABNORMAL HIGH (ref 65–99)

## 2015-10-15 LAB — CBC
HCT: 30.1 % — ABNORMAL LOW (ref 39.0–52.0)
Hemoglobin: 10.2 g/dL — ABNORMAL LOW (ref 13.0–17.0)
MCH: 29.1 pg (ref 26.0–34.0)
MCHC: 33.9 g/dL (ref 30.0–36.0)
MCV: 85.8 fL (ref 78.0–100.0)
Platelets: 342 10*3/uL (ref 150–400)
RBC: 3.51 MIL/uL — ABNORMAL LOW (ref 4.22–5.81)
RDW: 18.2 % — ABNORMAL HIGH (ref 11.5–15.5)
WBC: 14.2 10*3/uL — ABNORMAL HIGH (ref 4.0–10.5)

## 2015-10-15 LAB — SURGICAL PCR SCREEN
MRSA, PCR: NEGATIVE
Staphylococcus aureus: NEGATIVE

## 2015-10-15 LAB — HEPARIN LEVEL (UNFRACTIONATED): Heparin Unfractionated: 0.51 IU/mL (ref 0.30–0.70)

## 2015-10-15 MED ORDER — DEXTROSE 5 % IV SOLN
1.5000 g | INTRAVENOUS | Status: AC
  Administered 2015-10-16: 1.5 g via INTRAVENOUS
  Filled 2015-10-15: qty 1.5

## 2015-10-15 MED ORDER — DARBEPOETIN ALFA 100 MCG/0.5ML IJ SOSY
100.0000 ug | PREFILLED_SYRINGE | INTRAMUSCULAR | Status: DC
  Administered 2015-10-16 – 2015-10-23 (×2): 100 ug via INTRAVENOUS
  Filled 2015-10-15 (×3): qty 0.5

## 2015-10-15 MED ORDER — RENA-VITE PO TABS
1.0000 | ORAL_TABLET | Freq: Every day | ORAL | Status: DC
  Administered 2015-10-15 – 2015-11-01 (×18): 1 via ORAL
  Filled 2015-10-15 (×18): qty 1

## 2015-10-15 NOTE — Plan of Care (Signed)
Problem: RH BLADDER ELIMINATION Goal: RH STG MANAGE BLADDER WITH ASSISTANCE STG Manage Bladder With mod Assistance  Outcome: Not Progressing Requiring I&O caths for no voids

## 2015-10-15 NOTE — Progress Notes (Signed)
Speech Language Pathology Daily Session Note  Patient Details  Name: Isaiah [name] [facility] MRN: [id number] Date of Birth: 21-Feb-1966  Today's Date: 10/15/2015 SLP Individual Time: 1100-1200 SLP Individual Time Calculation (min): 60 min  Short Term Goals: Week 1: SLP Short Term Goal 1 (Week 1): Pt will demonstrate effective mastication of Dys.2 textures evidenced by no more than Min assist verbal cues to monitor residue/pocketing. SLP Short Term Goal 2 (Week 1): Pt will consume current diet with small portions at a slow pace with Min verbal cues. SLP Short Term Goal 3 (Week 1): Pt will follow 2 step directions with Min verbal and visual cues.  SLP Short Term Goal 4 (Week 1): Pt will self-monitor verbal perseveration with Max assist multimodal cues. SLP Short Term Goal 5 (Week 1): Pt will name familiar objects with Mod assist semantic and phonemic cues.  SLP Short Term Goal 6 (Week 1): Pt will utilize over articulation for production of phonemes at the CV, CVC/word level for accurate productions of vowels and bilabials with Min verbal, visual cues   Skilled Therapeutic Interventions: Skilled treatment session focused on functional communication. SLP facilitated session by providing Max A phonemic and semantic cues for naming of functional objects with 70% accuracy. Patient was able to match the written word to an object from a field of two with 100% accuracy and Mod I, however, patient with increased difficulty with reading comprehension at the phrase level with 66% accuracy. Patient required Min A verbal cues to self-monitor verbal perseveration and required Max-Total A to self-correct errors. Patient able to express basic wants/needs at the word level and with yes/no responses but required Max-Total A for verbal expression at the phrase level. Patient was ~75% intelligible at the word level. Patient left upright in wheelchair with all needs within reach. Continue with current plan of care.     Function:  Cognition Comprehension Comprehension assist level: Understands basic 75 - 89% of the time/ requires cueing 10 - 24% of the time  Expression   Expression assist level: Expresses basic 50 - 74% of the time/requires cueing 25 - 49% of the time. Needs to repeat parts of sentences.  Social Interaction Social Interaction assist level: Interacts appropriately 75 - 89% of the time - Needs redirection for appropriate language or to initiate interaction.  Problem Solving Problem solving assist level: Solves basic 50 - 74% of the time/requires cueing 25 - 49% of the time  Memory Memory assist level: Recognizes or recalls 50 - 74% of the time/requires cueing 25 - 49% of the time    Pain Pain Assessment Pain Assessment: No/denies pain  Therapy/Group: Individual Therapy  Isaiah [name] 10/15/2015, 3:10 PM

## 2015-10-15 NOTE — Progress Notes (Signed)
Patient's wife spoke with MD Ruta Hinds on the phone regarding procedure information. Patients' wife and patient denied any questions. Consent form signed.

## 2015-10-15 NOTE — Progress Notes (Signed)
Physical Therapy Session Note  Patient Details  Name: Isaiah [name] [facility] MRN: [id number] Date of Birth: 1966/08/10  Today's Date: 10/15/2015 PT Individual Time: 1510-1618 PT Individual Time Calculation (min): 68 min   Short Term Goals: Week 1:  PT Short Term Goal 1 (Week 1): Pt will perform bed mobility mod A and LRAD PT Short Term Goal 2 (Week 1): Pt will perform transfers with mod A and LRAD PT Short Term Goal 3 (Week 1): Pt will initiate gait  Skilled Therapeutic Interventions/Progress Updates:  Tx focused on functional mobility training, gait with RW, stairs, and NMR via forced use, manual facilitation, and multi-modal cues. Communication limited by global aphasia, but pt is very pleasant and tries very patiently to answer questions. Mobility notable for apraxia, but difficult to determine if related to recetive aphasia. Mother and wife present and lovely. Pt encouraged to stay OOB as much as possible throughout day.   Therapeutic activity Pt instructed in rolling and supine >sidelying>sit and back to supine end of tx with up to mod A for trunk lifting, cues for technique and sequence of transitional movements.   Sit<>stand throughout with up to Max A for lifting and lowering safely. Stand-step transfers with Mod A overal with step-by step cues and verbal cues for initiation. Pt initially demonstration retropulsion upon standing, but improved with manual facilitation.   Pt instructed in hemi-technique WC propulsion x25' with Max steering assist and hand-over-hand/foot facilitation at times due to coordination difficulties. Attempted bil LEs, but unsafe at this time.   Gait training with RW 1x50' and 1x25' with Max A overall/+2 for [facility] follow for postural control and steering. Gait marked by shuffling steps and flexed posture. Pt unable to make any significant changes with cues.   Stair training with 1 rail x4 and Max A for lowering, largely due to pt's challenge with motor planning this task  in step-to pattern. Ascending required Mod A, but pt eventually needed to descend sideway due to fear/freezing.   5 times sit<>stand test 53 seconds with UE support from 20" surface.   Pt returned to bed with family present and all needs in reach.       Therapy Documentation Precautions:  Precautions Precautions: Fall Restrictions Weight Bearing Restrictions: No General:   Vital Signs: Therapy Vitals Temp: 97.9 F (36.6 C) Temp Source: Oral Pulse Rate: 95 Resp: 18 BP: 127/87 mmHg Patient Position (if appropriate): Lying Oxygen Therapy SpO2: 96 % O2 Device: Not Delivered Pain: Pain Assessment Pain Assessment: No/denies pain Mobility:     See Function Navigator for Current Functional Status.   Therapy/Group: Individual Therapy  Sabri Teal Soundra Pilon, PT, DPT  10/15/2015, 3:46 PM

## 2015-10-15 NOTE — IPOC Note (Addendum)
Overall Plan of Care [facility]) Patient Details Name: Isaiah [name] MRN: [id number] DOB: 1966/06/29  Admitting Diagnosis: CVA   New ESRD  Hospital Problems: Principal Problem:   Hemiparesis, aphasia, and dysphagia as late effects of cerebrovascular accident ([place]) Active Problems:   PE (pulmonary embolism)   DM (diabetes mellitus) type II controlled with renal manifestation (HCC)   ESRD on dialysis ([place])   Embolic stroke ([place])   Gait disturbance, post-stroke     Functional Problem List: Nursing Bladder, Bowel, Pain, Safety  PT Balance, Endurance, Motor, Safety, Perception, Sensory, Skin Integrity  OT Balance, Cognition, Edema, Endurance, Motor, Perception, Safety, Sensory, Vision  SLP Cognition, Linguistic, Nutrition, Motor, Safety, Sensory  TR Activity tolerance, functional mobility, balance, cognition, safety, pain, anxiety/stress        Basic ADL's: OT Grooming, Bathing, Dressing, Toileting     Advanced  ADL's: OT       Transfers: PT Bed Mobility, Bed to Chair, Car, [employer], Floor  OT Tub/Shower, Agricultural engineer: PT Ambulation, Emergency planning/management officer, Stairs     Additional Impairments: OT Fuctional Use of Upper Extremity  SLP Swallowing, Communication, Social Cognition comprehension, expression Problem Solving, Memory, Awareness  TR  community integration    Anticipated Outcomes Item Anticipated Outcome  Self Feeding n/a  Swallowing  Supervision assist   Basic self-care  supervision  Toileting  min assist   Bathroom Transfers supervision  Bowel/Bladder  continent of bowel & bladder Mod I  Transfers  SBA  Locomotion  SBA 150' with [place]  Communication  Min-Mod assist  Cognition  Min-Mod assist   Pain  less than 3, on scale of 1-10  Safety/Judgment  free from falls while on rehab   Therapy Plan: PT Intensity: Minimum of 1-2 x/day ,45 to 90 minutes PT Frequency: 5 out of 7 days PT Duration Estimated Length of Stay: 14 to 21 days OT Intensity:  Minimum of 1-2 x/day, 45 to 90 minutes OT Frequency: 5 out of 7 days OT Duration/Estimated Length of Stay: 17-21 days SLP Intensity: Minumum of 1-2 x/day, 30 to 90 minutes SLP Frequency: 3 to 5 out of 7 days SLP Duration/Estimated Length of Stay: 17-21 days  TR Duration/ELOS:  3 weeks TR Frequency:  Min 1 time per week >20 minutes        Team Interventions: Nursing Interventions Patient/Family Education, Bladder Management, Bowel Management, Pain Management, Dysphagia/Aspiration Precaution Training, Psychosocial Support, Discharge Planning  PT interventions Ambulation/gait training, DME/adaptive equipment instruction, Psychosocial support, UE/LE Strength taining/ROM, Balance/vestibular training, Functional electrical stimulation, Skin care/wound management, UE/LE Coordination activities, Cognitive remediation/compensation, Functional mobility training, Visual/perceptual remediation/compensation, Community reintegration, Neuromuscular re-education, IT trainer, Wheelchair propulsion/positioning, Discharge planning, Therapeutic Activities, Disease management/prevention, Barrister's clerk education, Therapeutic Exercise  OT Interventions Training and development officer, DME/adaptive equipment instruction, Functional electrical stimulation, Cognitive remediation/compensation, Functional mobility training, Community reintegration, Discharge planning, Neuromuscular re-education, Splinting/orthotics, Self Care/advanced ADL retraining, Patient/family education, Therapeutic Activities, Therapeutic Exercise, UE/LE Strength taining/ROM, UE/LE Coordination activities, Visual/perceptual remediation/compensation, Wheelchair propulsion/positioning  SLP Interventions Cognitive remediation/compensation, English as a second language teacher, Dysphagia/aspiration precaution training, Environmental controls, Functional tasks, Internal/external aids, Patient/family education, Speech/Language facilitation, Therapeutic Exercise  TR Interventions    Recreation/leisure participation, Balance/Vestibular training, functional mobility, therapeutic activities, UE/LE strength/coordination, cognitive retraining/compensationw/c mobility, community reintegration, pt/family education, adaptive equipment instruction/use, discharge planning, psychosocial support  SW/CM Interventions Discharge Planning, Psychosocial Support, Patient/Family Education    Team Discharge Planning: Destination: PT-Home ,OT- Home , SLP-Home Projected Follow-up: PT-None, OT-   , SLP-24 hour supervision/assistance, Home Health SLP, Outpatient SLP Projected Equipment Needs:  PT-Cane, Wheelchair (measurements), Wheelchair cushion (measurements), To be determined, OT- To be determined, SLP-None recommended by SLP Equipment Details: PT- , OT-  Patient/family involved in discharge planning: PT- Patient,  OT-Patient, SLP-   MD ELOS: 18-21d Medical Rehab Prognosis:  Good Assessment: 50 year old RH-male with history of DM type 2 with diabetic, Sickle cell-thalassemia, B-PE 11/12, OSA, CKD stage V pending dialysis, anemia who was admitted to [facility] in [place] on 09/16/15 with N/V and blood stools with acute on chronic renal failure due to acute pancreatitis. He was treated with supportive care, IV bicarb as well as multiple units PRBC and EGD with severe stage IV erosive esophagitis with gastritis. Noted to have abnormal LFTs and Hep A, Hep B and Hep C panels negative. HD initiated on 01/09 due to poor recover with decrease in UOP and currently ongoing on MWF. He developed hypoxic respiratory failure requiring BIPAP? and was found to have RUL/RML PE as well as left popliteal DVT. He was also found to have left retrocardiac PNA and treated with IV antibiotics as well as IV heparin   Now requiring 24/7 Rehab RN,MD, as well as CIR level PT, OT and SLP.  Treatment team will focus on ADLs and mobility with goals set at MinA/Sup  See Team Conference  Notes for weekly updates to the plan of care

## 2015-10-15 NOTE — Progress Notes (Addendum)
Subjective/Complaints: Pt feels ok today, no blood in stools noted per pt, has regular BM qam  ROS- cannot complete, aphasic  Objective: Vital Signs: Blood pressure 107/70, pulse 91, temperature 97.7 F (36.5 C), temperature source Oral, resp. rate 18, height 5' 11"  (1.803 m), weight 94 kg (207 lb 3.7 oz), SpO2 97 %. No results found. Results for orders placed or performed during the hospital encounter of 10/12/15 (from the past 72 hour(s))  Glucose, capillary     Status: Abnormal   Collection Time: 10/12/15  6:16 PM  Result Value Ref Range   Glucose-Capillary 289 (H) 65 - 99 mg/dL  CBC     Status: Abnormal   Collection Time: 10/12/15  7:20 PM  Result Value Ref Range   WBC 16.2 (H) 4.0 - 10.5 K/uL   RBC 3.77 (L) 4.22 - 5.81 MIL/uL   Hemoglobin 11.2 (L) 13.0 - 17.0 g/dL   HCT 32.5 (L) 39.0 - 52.0 %   MCV 86.2 78.0 - 100.0 fL   MCH 29.7 26.0 - 34.0 pg   MCHC 34.5 30.0 - 36.0 g/dL   RDW 18.8 (H) 11.5 - 15.5 %   Platelets 267 150 - 400 K/uL  Renal function panel     Status: Abnormal   Collection Time: 10/12/15  7:20 PM  Result Value Ref Range   Sodium 132 (L) 135 - 145 mmol/L   Potassium 5.2 (H) 3.5 - 5.1 mmol/L   Chloride 96 (L) 101 - 111 mmol/L   CO2 20 (L) 22 - 32 mmol/L   Glucose, Bld 318 (H) 65 - 99 mg/dL   BUN 42 (H) 6 - 20 mg/dL   Creatinine, Ser 6.07 (H) 0.61 - 1.24 mg/dL   Calcium 9.2 8.9 - 10.3 mg/dL   Phosphorus 3.7 2.5 - 4.6 mg/dL   Albumin 3.0 (L) 3.5 - 5.0 g/dL   GFR calc non Af Amer 10 (L) >60 mL/min   GFR calc Af Amer 11 (L) >60 mL/min    Comment: (NOTE) The eGFR has been calculated using the CKD EPI equation. This calculation has not been validated in all clinical situations. eGFR's persistently <60 mL/min signify possible Chronic Kidney Disease.    Anion gap 16 (H) 5 - 15  Glucose, capillary     Status: Abnormal   Collection Time: 10/12/15  9:18 PM  Result Value Ref Range   Glucose-Capillary 420 (H) 65 - 99 mg/dL   Comment 1 Notify RN   Heparin  level (unfractionated)     Status: None   Collection Time: 10/13/15  5:40 AM  Result Value Ref Range   Heparin Unfractionated 0.37 0.30 - 0.70 IU/mL    Comment:        IF HEPARIN RESULTS ARE BELOW EXPECTED VALUES, AND PATIENT DOSAGE HAS BEEN CONFIRMED, SUGGEST FOLLOW UP TESTING OF ANTITHROMBIN III LEVELS.   CBC     Status: Abnormal   Collection Time: 10/13/15  5:40 AM  Result Value Ref Range   WBC 15.5 (H) 4.0 - 10.5 K/uL   RBC 3.61 (L) 4.22 - 5.81 MIL/uL   Hemoglobin 10.6 (L) 13.0 - 17.0 g/dL   HCT 31.0 (L) 39.0 - 52.0 %   MCV 85.9 78.0 - 100.0 fL   MCH 29.4 26.0 - 34.0 pg   MCHC 34.2 30.0 - 36.0 g/dL   RDW 18.7 (H) 11.5 - 15.5 %   Platelets 285 150 - 400 K/uL  Glucose, capillary     Status: Abnormal   Collection Time: 10/13/15  6:41 AM  Result Value Ref Range   Glucose-Capillary 235 (H) 65 - 99 mg/dL   Comment 1 Notify RN   Heparin level (unfractionated)     Status: Abnormal   Collection Time: 10/13/15 11:53 AM  Result Value Ref Range   Heparin Unfractionated 0.18 (L) 0.30 - 0.70 IU/mL    Comment:        IF HEPARIN RESULTS ARE BELOW EXPECTED VALUES, AND PATIENT DOSAGE HAS BEEN CONFIRMED, SUGGEST FOLLOW UP TESTING OF ANTITHROMBIN III LEVELS.   Glucose, capillary     Status: Abnormal   Collection Time: 10/13/15 12:15 PM  Result Value Ref Range   Glucose-Capillary 360 (H) 65 - 99 mg/dL  Renal function panel     Status: Abnormal   Collection Time: 10/13/15  3:15 PM  Result Value Ref Range   Sodium 129 (L) 135 - 145 mmol/L   Potassium 4.7 3.5 - 5.1 mmol/L   Chloride 94 (L) 101 - 111 mmol/L   CO2 23 22 - 32 mmol/L   Glucose, Bld 426 (H) 65 - 99 mg/dL   BUN 52 (H) 6 - 20 mg/dL   Creatinine, Ser 6.42 (H) 0.61 - 1.24 mg/dL   Calcium 9.1 8.9 - 10.3 mg/dL   Phosphorus 3.0 2.5 - 4.6 mg/dL   Albumin 2.7 (L) 3.5 - 5.0 g/dL   GFR calc non Af Amer 9 (L) >60 mL/min   GFR calc Af Amer 10 (L) >60 mL/min    Comment: (NOTE) The eGFR has been calculated using the CKD EPI  equation. This calculation has not been validated in all clinical situations. eGFR's persistently <60 mL/min signify possible Chronic Kidney Disease.    Anion gap 12 5 - 15  Hepatitis B surface antigen     Status: None   Collection Time: 10/13/15  3:15 PM  Result Value Ref Range   Hepatitis B Surface Ag Negative Negative    Comment: (NOTE) Performed At: [facility] [place], [place] [id number] Lindon Romp MD [id number]   Hepatitis B surface antibody     Status: None   Collection Time: 10/13/15  3:15 PM  Result Value Ref Range   Hep B S Ab Non Reactive     Comment: (NOTE)              Non Reactive: Inconsistent with immunity,                            less than 10 mIU/mL              Reactive:     Consistent with immunity,                            greater than 9.9 mIU/mL Performed At: [name] [place], [place] [id number] Lindon Romp MD TO:[id number]   Hepatitis B core antibody, total     Status: None   Collection Time: 10/13/15  3:15 PM  Result Value Ref Range   Hep B Core Total Ab Negative Negative    Comment: (NOTE) Performed At: [facility] [address] [place], [place] [id number] Lindon Romp MD [id number]   Glucose, capillary     Status: Abnormal   Collection Time: 10/13/15  8:22 PM  Result Value Ref Range   Glucose-Capillary 134 (H) 65 - 99 mg/dL  Glucose, capillary     Status: Abnormal  Collection Time: 10/13/15  9:21 PM  Result Value Ref Range   Glucose-Capillary 205 (H) 65 - 99 mg/dL  Heparin level (unfractionated)     Status: None   Collection Time: 10/13/15 10:26 PM  Result Value Ref Range   Heparin Unfractionated 0.64 0.30 - 0.70 IU/mL    Comment:        IF HEPARIN RESULTS ARE BELOW EXPECTED VALUES, AND PATIENT DOSAGE HAS BEEN CONFIRMED, SUGGEST FOLLOW UP TESTING OF ANTITHROMBIN III LEVELS.   Glucose, capillary     Status: Abnormal   Collection Time: 10/14/15  6:40  AM  Result Value Ref Range   Glucose-Capillary 255 (H) 65 - 99 mg/dL  Glucose, capillary     Status: Abnormal   Collection Time: 10/14/15  9:01 AM  Result Value Ref Range   Glucose-Capillary 240 (H) 65 - 99 mg/dL  CBC     Status: Abnormal   Collection Time: 10/14/15  9:45 AM  Result Value Ref Range   WBC 16.1 (H) 4.0 - 10.5 K/uL   RBC 3.73 (L) 4.22 - 5.81 MIL/uL   Hemoglobin 11.0 (L) 13.0 - 17.0 g/dL   HCT 32.4 (L) 39.0 - 52.0 %   MCV 86.9 78.0 - 100.0 fL   MCH 29.5 26.0 - 34.0 pg   MCHC 34.0 30.0 - 36.0 g/dL   RDW 18.4 (H) 11.5 - 15.5 %   Platelets 335 150 - 400 K/uL  Heparin level (unfractionated)     Status: None   Collection Time: 10/14/15  9:45 AM  Result Value Ref Range   Heparin Unfractionated 0.39 0.30 - 0.70 IU/mL    Comment:        IF HEPARIN RESULTS ARE BELOW EXPECTED VALUES, AND PATIENT DOSAGE HAS BEEN CONFIRMED, SUGGEST FOLLOW UP TESTING OF ANTITHROMBIN III LEVELS.   Glucose, capillary     Status: Abnormal   Collection Time: 10/14/15 11:49 AM  Result Value Ref Range   Glucose-Capillary 292 (H) 65 - 99 mg/dL  Glucose, capillary     Status: Abnormal   Collection Time: 10/14/15  4:43 PM  Result Value Ref Range   Glucose-Capillary 301 (H) 65 - 99 mg/dL  Urinalysis, Routine w reflex microscopic (not at [facility])     Status: Abnormal   Collection Time: 10/14/15  7:36 PM  Result Value Ref Range   Color, Urine YELLOW YELLOW   APPearance CLEAR CLEAR   Specific Gravity, Urine 1.011 1.005 - 1.030   pH 6.0 5.0 - 8.0   Glucose, UA 500 (A) NEGATIVE mg/dL   Hgb urine dipstick NEGATIVE NEGATIVE   Bilirubin Urine NEGATIVE NEGATIVE   Ketones, ur NEGATIVE NEGATIVE mg/dL   Protein, ur 100 (A) NEGATIVE mg/dL   Nitrite NEGATIVE NEGATIVE   Leukocytes, UA NEGATIVE NEGATIVE  Urine microscopic-add on     Status: Abnormal   Collection Time: 10/14/15  7:36 PM  Result Value Ref Range   Squamous Epithelial / LPF 0-5 (A) NONE SEEN   WBC, UA NONE SEEN 0 - 5 WBC/hpf   RBC / HPF NONE  SEEN 0 - 5 RBC/hpf   Bacteria, UA NONE SEEN NONE SEEN  Glucose, capillary     Status: Abnormal   Collection Time: 10/14/15  9:33 PM  Result Value Ref Range   Glucose-Capillary 280 (H) 65 - 99 mg/dL      General: No acute distress Mood and affect are appropriate Heart: Regular rate and rhythm no rubs murmurs or extra sounds Lungs: Clear to auscultation, breathing unlabored, no rales  or wheezes Abdomen: Positive bowel sounds, soft nontender to palpation, nondistended Extremities: No clubbing, cyanosis, or edema Skin: No evidence of breakdown, no evidence of rash Neurologic: Cranial nerves II through XII intact, motor strength is 5/5 in left deltoid, bicep, tricep, grip, hip flexor, knee extensors, ankle dorsiflexor and plantar flexor 2 minus in the right deltoid, biceps, triceps, grip, 3 minus left hip flexor and extensor, 2 minus left ankle dorsiflexor Sensory examDifficult to assess secondary to a aphasia, does feel pinch in all 4 limbs Cerebellar exam normal finger to nose to finger as well as heel to shin in bilateral upper and lower extremities Musculoskeletal: Full range of motion in all 4 extremities. No joint swelling   Assessment/Plan: 1. 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 Physiatrist is providing close team supervision and 24 hour management of active medical problems listed below. Physiatrist and rehab team continue to assess barriers to discharge/monitor patient progress toward functional and medical goals. FIM: Function - Bathing Position: Bed Body parts bathed by patient: Right arm, Chest, Abdomen, Front perineal area, Right upper leg, Left upper leg Body parts bathed by helper: Right arm, Left arm, Front perineal area, Buttocks, Back Assist Level: Touching or steadying assistance(Pt >  75%)  Function- Upper Body Dressing/Undressing What is the patient wearing?: Pull over shirt/dress Pull over shirt/dress - Perfomed by patient: Thread/unthread left sleeve, Put head through opening Pull over shirt/dress - Perfomed by helper: Thread/unthread right sleeve, Pull shirt over trunk Assist Level: Touching or steadying assistance(Pt > 75%) Function - Lower Body Dressing/Undressing What is the patient wearing?: Non-skid slipper socks, Pants Pants- Performed by helper: Thread/unthread right pants leg, Thread/unthread left pants leg, Pull pants up/down Non-skid slipper socks- Performed by helper: Don/doff right sock, Don/doff left sock Assist for footwear: Dependant Assist for lower body dressing: Touching or steadying assistance (Pt > 75%)        Function - Chair/bed transfer Chair/bed transfer method: Squat pivot Chair/bed transfer assist level: Maximal assist (Pt 25 - 49%/lift and lower)  Function - Locomotion: Wheelchair Max wheelchair distance: 5' Assist Level: Maximal assistance (Pt 25 - 49%) Function - Locomotion: Ambulation Ambulation activity did not occur: Safety/medical concerns Assistive device: No device Max distance: 20 (and 5') Assist level: 2 helpers (mod A and +2 help for w/c follow)  Function - Comprehension Comprehension: Auditory Comprehension assist level: Understands basic 75 - 89% of the time/ requires cueing 10 - 24% of the time  Function - Expression Expression: Verbal Expression assist level: Expresses basic 50 - 74% of the time/requires cueing 25 - 49% of the time. Needs to repeat parts of sentences.  Function - Social Interaction Social Interaction assist level: Interacts appropriately 75 - 89% of the time - Needs redirection for appropriate language or to initiate interaction.  Function - Problem Solving Problem solving assist level: Solves basic 25 - 49% of the time - needs direction more than half the time to initiate, plan or complete  simple activities  Function - Memory Memory assist level: Recognizes or recalls 50 - 74% of the time/requires cueing 25 - 49% of the time Patient normally able to recall (first 3 days only): That he or she is in a hospital, Current season  Medical Problem List and Plan: 1. Right hemiparesis and aphasia secondary to bi-cerebral embolic infarcts (L>R)- Team conference today please see physician documentation under team conference  tab, met with team face-to-face to discuss problems,progress, and goals. Formulized individual treatment plan based on medical history, underlying problem and comorbidities. 2. PE/DVT/[place]/Anticoagulation: Pharmaceutical: Lovenox bid per records from [place].  Off  iv heparin gtt warfarin slightly subtherapeutic. f/u OP with Dr Alen Blew, hematology 3. Pain Management: Ultram prn.  4. Mood: LCSW to follow for evaluation and support. Family Conf with Pt wife today to discuss D/C plans 5. Neuropsych: This patient is not fully capable of making decisions on his own behalf. 6. Skin/Wound Care: Routine pressure relief measures.  7. Fluids/Electrolytes/Nutrition: Renal restrictions. 1200 cc/FR.  8. DM type 2: Monitor BS ac/hs. Continue levemir and use SSI for elevated BS.uncontrolled will increase Levimir to 25U 9. HTN: Monitor BP bid and titrate medications as needed. Elevated at admission. Will set parameters on Midodrine.  10. ESRD on HD: per [facility]. HD tomorrow after therapies. Continue diet/volume mgt,       11. Leukocytosis will check UA,no breathing difficulties or coughing.UA negative except for elevated protein as expected from renal failure 12.  Hx of GI bleed will monitor for reurrence, latest Hgb improved to 12.1 LOS (Days) 3 A FACE TO FACE EVALUATION WAS PERFORMED  Aftan Vint E 10/15/2015, 6:41 AM

## 2015-10-15 NOTE — Progress Notes (Signed)
IP PROGRESS NOTE  Subjective:   Isaiah [name] is a 50 year old gentleman known to me with the following issues:   1. Monoclonal protein in the form of IgG kappa, likely represents monoclonal gammopathy, undetermined significance versus a smoldering myeloma. He presented with M spike about 1 g/dL and bone marrow biopsy showed 7% plasma cell involvement without evidence of myeloma in 12/2010. He did not have any lytic bone lesions.   Repeat a bone marrow biopsy in 10/16/2014 showed 13% plasma cell infiltration and no lytic bone lesions on his skeletal survey.  2. Multifactorial anemia: The etiology includes anemia of renal disease, plasma cell disorder, myeloproliferative disorder, and GI bleeding.  3. 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 Has been on warfarin previously but discontinued because of GI bleeding.  He is currently living in [place] because of his job but does travel to this area periodically. I saw her evaluation back in 09/06/2015.  He presented [facility] in [place] on 09/16/15 with N/V and blood stools with acute on chronic renal failure due to acute pancreatitis.    He developed hypoxic respiratory failure was found to have RUL/RML PE as well as left popliteal DVT. He was also found to have left retrocardiac PNA and treated with IV antibiotics as well as IV heparin.IVC filter was placed and post procedure was found to have right hand weakness.   He had worsening of symptoms with increase in right sided weakness, facial droop speech difficulty on 09/27/15. MRI on 01/21 showed Scattered foci of cortical, cerebellar and deep gray matter infarcts with mild petechial hemorrhage and developing laminar necrosis. On 01/22, he had worsening of aphasia and repeat MRI brain 1/22 showed new Restricted diffusion in left temporal and larger more prominent area in posterior left frontal/ perirolandic area involving motor strip in  addition to numerous scattered infarcts in bilateral cerebral and cerebellar hemispheres. MRA without significant stenosis. Echo with positive bubble study and TEE positive for PFO. Hypercoagulation studies reported to be negative. IV heparin was changed to Lovenox due to recurrent embolic strokes on heparin.  He was transferred here to acute rehabilitation on 10/12/2015 and his Lovenox was switched to IV heparin and he is been therapeutic on that infusion.  Clinically, he still having a seizure and right-sided weakness. But he is awake and alert and able to give a reasonable history. He does have a aphasia which limits his speech.  Objective:  Vital signs in last 24 hours: Temp:  [97.4 F (36.3 C)-97.7 F (36.5 C)] 97.7 F (36.5 C) (02/06 8786) Pulse Rate:  [91-106] 91 (02/06 0633) Resp:  [18] 18 (02/06 0633) BP: (107-125)/(70-96) 107/70 mmHg (02/06 0633) SpO2:  [97 %-99 %] 97 % (02/06 0633) Weight:  [207 lb 3.7 oz (94 kg)] 207 lb 3.7 oz (94 kg) (02/06 0500) Weight change: -5 lb 15.2 oz (-2.7 kg) Last BM Date: 10/13/15  Intake/Output from previous day: 02/05 0701 - 02/06 0700 In: 480 [P.O.:480] Out: 750 [Urine:750] Awake, alert gentleman appearing without distress. Mouth: mucous membranes moist, pharynx normal without lesions Resp: clear to auscultation bilaterally Cardio: regular rate and rhythm, S1, S2 normal, no murmur, click, rub or gallop GI: soft, non-tender; bowel sounds normal; no masses,  no organomegaly Extremities: extremities normal, atraumatic, no cyanosis or edema  Right-sided weakness noted in his upper and lower extremities.  Current Facility-Administered Medications  Medication Dose Route Frequency Provider Last Rate Last Dose  . 0.9 %  sodium chloride infusion  100 mL Intravenous PRN Elmarie Shiley, MD      . 0.9 %  sodium chloride infusion  100 mL Intravenous PRN Elmarie Shiley, MD      . acetaminophen (TYLENOL) tablet 325-650 mg  325-650 mg Oral Q4H PRN Ivan Anchors Love,  PA-C      . alteplase (CATHFLO ACTIVASE) injection 2 mg  2 mg Intracatheter Once PRN Elmarie Shiley, MD      . antiseptic oral rinse (CPC / CETYLPYRIDINIUM CHLORIDE 0.05%) solution 7 mL  7 mL Mouth Rinse BID Charlett Blake, MD   7 mL at 10/15/15 0827  . aspirin EC tablet 81 mg  81 mg Oral Daily Bary Leriche, PA-C   81 mg at 10/15/15 0086  . bisacodyl (DULCOLAX) EC tablet 5 mg  5 mg Oral Daily PRN Bary Leriche, PA-C      . diphenhydrAMINE (BENADRYL) 12.5 MG/5ML elixir 12.5-25 mg  12.5-25 mg Oral Q6H PRN Bary Leriche, PA-C      . Fluticasone Furoate-Vilanterol 200-25 MCG/INH AEPB 1 puff  1 puff Inhalation Daily Charlett Blake, MD   1 puff at 10/14/15 0843  . heparin ADULT infusion 100 units/mL (25000 units/250 mL)  1,400 Units/hr Intravenous Continuous Charlett Blake, MD 14 mL/hr at 10/15/15 0251 1,400 Units/hr at 10/15/15 0251  . heparin injection 1,000 Units  1,000 Units Dialysis PRN Elmarie Shiley, MD      . insulin aspart (novoLOG) injection 0-5 Units  0-5 Units Subcutaneous QHS Bary Leriche, PA-C   3 Units at 10/14/15 2213  . insulin aspart (novoLOG) injection 0-9 Units  0-9 Units Subcutaneous TID WC Bary Leriche, PA-C   1 Units at 10/15/15 [phone]  . insulin detemir (LEVEMIR) injection 20 Units  20 Units Subcutaneous QHS Charlett Blake, MD   20 Units at 10/14/15 2214  . ipratropium-albuterol (DUONEB) 0.5-2.5 (3) MG/3ML nebulizer solution 3 mL  3 mL Nebulization Q4H PRN Artemio Aly, MD      . lidocaine (PF) (XYLOCAINE) 1 % injection 5 mL  5 mL Intradermal PRN Elmarie Shiley, MD      . lidocaine-prilocaine (EMLA) cream 1 application  1 application Topical PRN Elmarie Shiley, MD      . midodrine (PROAMATINE) tablet 5 mg  5 mg Oral TID WC Bary Leriche, PA-C   5 mg at 10/15/15 5093  . montelukast (SINGULAIR) tablet 10 mg  10 mg Oral QHS Ivan Anchors Love, PA-C   10 mg at 10/14/15 2045  . pantoprazole (PROTONIX) EC tablet 40 mg  40 mg Oral BID Bary Leriche, PA-C   40 mg at 10/15/15 2671  .  pentafluoroprop-tetrafluoroeth (GEBAUERS) aerosol 1 application  1 application Topical PRN Elmarie Shiley, MD      . senna-docusate (Senokot-S) tablet 1 tablet  1 tablet Oral QHS PRN Bary Leriche, PA-C      . simethicone [facility]) chewable tablet 80 mg  80 mg Oral QID PRN Bary Leriche, PA-C      . traMADol Veatrice Bourbon) tablet 50 mg  50 mg Oral Q12H PRN Bary Leriche, PA-C   50 mg at 10/14/15 2221  . traZODone (DESYREL) tablet 25-50 mg  25-50 mg Oral QHS PRN Bary Leriche, PA-C         Lab Results:  Recent Labs  10/14/15 0945 10/15/15 0645  WBC 16.1* 14.2*  HGB 11.0* 10.2*  HCT 32.4* 30.1*  PLT 335 342    BMET  Recent Labs  10/12/15  1920 10/13/15 1515  NA 132* 129*  K 5.2* 4.7  CL 96* 94*  CO2 20* 23  GLUCOSE 318* 426*  BUN 42* 52*  CREATININE 6.07* 6.42*  CALCIUM 9.2 9.1     Medications: I have reviewed the patient's current medications.  Assessment/Plan:  50 year old gentleman with the following issues:  1. Embolic stroke with history of hypercoagulability. Presented with a DVT in the PE and subsequently developed a left CVA manifesting with a aphasia and right-sided weakness. He is currently on intravenous heparin and appears to be on therapeutic doses. His hemoglobin appeared stable since his platelets. He does have an IVC filter placed during his hospitalization in [place].  His long term anticoagulation does present a serious challenge. It is unclear to me that he has heparin failure at this time as it is possible he developed an embolic stroke while he was undergoing an IVC filter procedure during heparin interruption.  Given his renal failure along with anticoagulation agents provide serious bleeding complications moving forward. He has had multiple hospitalizations in the past because of GI bleeding that required packed red cell transfusions.  Warfarin have been difficult to regulate and him in the past and would be the easiest to reverse moving forward. This has  also caused severe GI bleeding in the past that required hospitalization and transfusions.  Newer agents such as Eliquis, Pradaxa or Xarelto provided more serious challenges given the fact that are difficult to reverse in the case of bleeding especially in the setting of renal failure.  For the time being, I have recommended continuing intravenous heparin and watching his hemoglobin closely. If his hemoglobin starts to drop, I would discontinue heparin altogether.  As far as long-term anticoagulation, I feel be reasonable to consider aspirin alone and hope that IVC filter will prevent any future embolic strokes from occurring. An alternative strategy is to consider warfarin and follow closely for signs or symptoms of bleeding.   It will be a difficult decision either way as both approaches will probably risk moving forward.  2. Plasma cell disorder: I believe he has MGUS and I do not think is contributing to his thrombosis episodes.  3. Renal failure: This currently on hemodialysis.       LOS: 3 days   VCBSWH,QPRFF 10/15/2015, 10:05 AM

## 2015-10-15 NOTE — Progress Notes (Signed)
ANTICOAGULATION CONSULT NOTE - Follow Up Consult  Pharmacy Consult for lovenox Indication: pulmonary embolus/DVT/stroke  Allergies  Allergen Reactions  . Codeine Other (See Comments)    Pt unknown  . Codeine Rash and Other (See Comments)    Unknown reaction (patient says it was more serious than just a rash, but he can't remember what happened)    Patient Measurements: Height: 5' 11"  (180.3 cm) Weight: 207 lb 3.7 oz (94 kg) IBW/kg (Calculated) : 75.3 Heparin Dosing Weight:   Vital Signs: Temp: 97.7 F (36.5 C) (02/06 0633) Temp Source: Oral (02/06 0633) BP: 107/70 mmHg (02/06 0633) Pulse Rate: 91 (02/06 0633)  Labs:  Recent Labs  10/12/15 1920 10/13/15 0540  10/13/15 1515 10/13/15 2226 10/14/15 0945 10/15/15 0645  HGB 11.2* 10.6*  --   --   --  11.0* 10.2*  HCT 32.5* 31.0*  --   --   --  32.4* 30.1*  PLT 267 285  --   --   --  335 342  HEPARINUNFRC  --  0.37  < >  --  0.64 0.39 0.51  CREATININE 6.07*  --   --  6.42*  --   --   --   < > = values in this interval not displayed.  Estimated Creatinine Clearance: 15.9 mL/min (by C-G formula based on Cr of 6.42).   Medications:  Scheduled:  . antiseptic oral rinse  7 mL Mouth Rinse BID  . aspirin EC  81 mg Oral Daily  . Fluticasone Furoate-Vilanterol  1 puff Inhalation Daily  . insulin aspart  0-5 Units Subcutaneous QHS  . insulin aspart  0-9 Units Subcutaneous TID WC  . insulin detemir  20 Units Subcutaneous QHS  . midodrine  5 mg Oral TID WC  . montelukast  10 mg Oral QHS  . pantoprazole  40 mg Oral BID   Infusions:  . heparin 1,400 Units/hr (10/15/15 0251)    Assessment: 50 yo male with PE/DVT/stroke is currently on therapeutic heparin.  Heparin level is 0.51.  Goal of Therapy:  Heparin level 0.3-0.5 Monitor platelets by anticoagulation protocol: Yes   Plan:  - continue heparin at 1400 units/hr - heparin level in am  Ianna Salmela, Tsz-Yin 10/15/2015,8:21 AM

## 2015-10-15 NOTE — Progress Notes (Signed)
S: No new CO O:BP 107/70 mmHg  Pulse 91  Temp(Src) 97.7 F (36.5 C) (Oral)  Resp 18  Ht 5' 11"  (1.803 m)  Wt 94 kg (207 lb 3.7 oz)  BMI 28.92 kg/m2  SpO2 97%  Intake/Output Summary (Last 24 hours) at 10/15/15 1145 Last data filed at 10/15/15 0745  Gross per 24 hour  Intake    360 ml  Output    750 ml  Net   -390 ml   Weight change: -2.7 kg (-5 lb 15.2 oz) RWE:RXVQM and alert CVS:RRR Resp:clear Abd:+ BS NTND Ext:No edema NEURO: Awake and alert, aphasic Rt temp HD cath   . antiseptic oral rinse  7 mL Mouth Rinse BID  . aspirin EC  81 mg Oral Daily  . Fluticasone Furoate-Vilanterol  1 puff Inhalation Daily  . insulin aspart  0-5 Units Subcutaneous QHS  . insulin aspart  0-9 Units Subcutaneous TID WC  . insulin detemir  20 Units Subcutaneous QHS  . midodrine  5 mg Oral TID WC  . montelukast  10 mg Oral QHS  . pantoprazole  40 mg Oral BID   No results found. BMET    Component Value Date/Time   NA 129* 10/13/2015 1515   NA 138 09/06/2015 1306   K 4.7 10/13/2015 1515   K 4.8 09/06/2015 1306   CL 94* 10/13/2015 1515   CO2 23 10/13/2015 1515   CO2 15* 09/06/2015 1306   GLUCOSE 426* 10/13/2015 1515   GLUCOSE 98 09/06/2015 1306   BUN 52* 10/13/2015 1515   BUN 54.0* 09/06/2015 1306   CREATININE 6.42* 10/13/2015 1515   CREATININE 5.8* 09/06/2015 1306   CALCIUM 9.1 10/13/2015 1515   CALCIUM 7.6* 09/06/2015 1306   GFRNONAA 9* 10/13/2015 1515   GFRAA 10* 10/13/2015 1515   CBC    Component Value Date/Time   WBC 14.2* 10/15/2015 0645   WBC 17.1* 09/06/2015 1306   RBC 3.51* 10/15/2015 0645   RBC 4.26 09/06/2015 1306   RBC 3.07* 09/29/2014 1810   HGB 10.2* 10/15/2015 0645   HGB 11.3* 09/06/2015 1306   HCT 30.1* 10/15/2015 0645   HCT 32.8* 09/06/2015 1306   PLT 342 10/15/2015 0645   PLT 322 09/06/2015 1306   MCV 85.8 10/15/2015 0645   MCV 77.0* 09/06/2015 1306   MCH 29.1 10/15/2015 0645   MCH 26.5* 09/06/2015 1306   MCHC 33.9 10/15/2015 0645   MCHC 34.5  09/06/2015 1306   RDW 18.2* 10/15/2015 0645   RDW 19.1* 09/06/2015 1306   LYMPHSABS 1.6 09/06/2015 1306   LYMPHSABS 4.5* 10/04/2012 1340   MONOABS 1.1* 09/06/2015 1306   MONOABS 1.7* 10/04/2012 1340   EOSABS 0.3 09/06/2015 1306   EOSABS 0.8* 10/04/2012 1340   BASOSABS 0.1 09/06/2015 1306   BASOSABS 0.2* 10/04/2012 1340     Assessment:  1. New ESRD 2. Embolic CVA 3. Anemia  Plan: 1. Plan HD tomorrow 2.  Ask VVS to place permanent HD cath 3. Start aranesp   Avanti Jetter T

## 2015-10-15 NOTE — Progress Notes (Signed)
Social Work Patient ID: Isaiah [name], male   DOB: [birth year], 50 y.o.   MRN: [id number]   CSW met with pt briefly to introduce myself and role of CSW.  Pt's wife was not present during visit and he said she should be visiting today.  CSW left business card and will attempt to see pt again when wife is here since pt cannot communicate with CSW.  Continue to follow and assist as needed.

## 2015-10-15 NOTE — Progress Notes (Signed)
Occupational Therapy Session Note  Patient Details  Name: Isaiah [name] MRN: [id number] Date of Birth: December 31, 1965  Today's Date: 10/15/2015 OT Individual Time: 0900-1030                                           OT Individual Time Calculation (min): 90 min    Short Term Goals: Week 1:  OT Short Term Goal 1 (Week 1): Pt will complete toilet transfer with min assist OT Short Term Goal 2 (Week 1): Pt will complete LB dressing with mod assist OT Short Term Goal 3 (Week 1): Pt will complete UB dressing with min assist OT Short Term Goal 4 (Week 1): Pt will engage in functional activity in standing for 1 minute with min asisst  Skilled Therapeutic Interventions/Progress Updates:     1st session:Focus of treatment was bed mobility, transfers,  Neuro-muscular reeducation, sitting balance, standing balance, attention, therapeutic activities.  Pt lying in bed upon arrival.  Pt agreed to engage in bathing and dressing.  Performed LB bathing and dressing with HOB 40 degrees.  Educated pt on bringing legs up so he could reach but he showed decreased initiation when he needed to do it again and went over again.  He is right hand dominant and is now having to use his left hand and may be experiencing motor control and coordination issues with LUE.  Pt went from supine to EOB with mod assist and transferred to wc with mod assist.  He needed instructional cues to pull and place shirt on his body using his LUE.  He brushed teeth.  Needed instructional cues to lean over sink.  Question is pt is having motor planning problems.  Completed all tasks and Left pt in wc with  call bell,phone within reach.            Therapy Documentation Precautions:  Precautions Precautions: Fall Restrictions Weight Bearing Restrictions: No      Pain:  none        See Function Navigator for Current Functional Status.   Therapy/Group: Individual Therapy  Lisa Roca 10/15/2015, 1:31 PM

## 2015-10-15 NOTE — Progress Notes (Signed)
Asked by Dr Mercy Moore to change pt dialysis catheter to tunneled cath  Will plan for tomorrow  NPO p midnight Consent for tunneled dialysis catheter Heparin off on call to [place], MD [facility] Office: [phone] Pager: [phone]

## 2015-10-15 NOTE — Progress Notes (Signed)
Patient information reviewed and entered into eRehab system by Sherleen Pangborn, RN, CRRN, PPS Coordinator.  Information including medical coding and functional independence measure will be reviewed and updated through discharge.    

## 2015-10-16 ENCOUNTER — Inpatient Hospital Stay (HOSPITAL_COMMUNITY): Payer: BLUE CROSS/BLUE SHIELD | Admitting: Occupational Therapy

## 2015-10-16 ENCOUNTER — Encounter (HOSPITAL_COMMUNITY)
Admission: RE | Disposition: A | Discharge status: Home Health | Payer: Self-pay | Source: Intra-hospital | Attending: Physical Medicine & Rehabilitation

## 2015-10-16 ENCOUNTER — Inpatient Hospital Stay (HOSPITAL_COMMUNITY): Payer: BLUE CROSS/BLUE SHIELD | Admitting: Speech Pathology

## 2015-10-16 ENCOUNTER — Ambulatory Visit (HOSPITAL_COMMUNITY)
Admission: RE | Admit: 2015-10-16 | Payer: BLUE CROSS/BLUE SHIELD | Source: Ambulatory Visit | Admitting: Vascular Surgery

## 2015-10-16 ENCOUNTER — Inpatient Hospital Stay (HOSPITAL_COMMUNITY): Payer: BLUE CROSS/BLUE SHIELD | Admitting: *Deleted

## 2015-10-16 ENCOUNTER — Encounter (HOSPITAL_COMMUNITY): Payer: Self-pay | Admitting: Certified Registered Nurse Anesthetist

## 2015-10-16 ENCOUNTER — Inpatient Hospital Stay (HOSPITAL_COMMUNITY): Payer: BLUE CROSS/BLUE SHIELD

## 2015-10-16 ENCOUNTER — Inpatient Hospital Stay (HOSPITAL_COMMUNITY): Payer: BLUE CROSS/BLUE SHIELD | Admitting: Certified Registered Nurse Anesthetist

## 2015-10-16 ENCOUNTER — Inpatient Hospital Stay (HOSPITAL_COMMUNITY): Payer: Self-pay | Admitting: Physical Therapy

## 2015-10-16 HISTORY — PX: INSERTION OF DIALYSIS CATHETER: SHX1324

## 2015-10-16 LAB — GLUCOSE, CAPILLARY
Glucose-Capillary: 152 mg/dL — ABNORMAL HIGH (ref 65–99)
Glucose-Capillary: 154 mg/dL — ABNORMAL HIGH (ref 65–99)
Glucose-Capillary: 161 mg/dL — ABNORMAL HIGH (ref 65–99)
Glucose-Capillary: 135 mg/dL — ABNORMAL HIGH (ref 65–99)

## 2015-10-16 LAB — CBC
HCT: 32.6 % — ABNORMAL LOW (ref 39.0–52.0)
Hemoglobin: 10.8 g/dL — ABNORMAL LOW (ref 13.0–17.0)
MCH: 28.5 pg (ref 26.0–34.0)
MCHC: 33.1 g/dL (ref 30.0–36.0)
MCV: 86 fL (ref 78.0–100.0)
Platelets: 328 10*3/uL (ref 150–400)
RBC: 3.79 MIL/uL — ABNORMAL LOW (ref 4.22–5.81)
RDW: 18.1 % — ABNORMAL HIGH (ref 11.5–15.5)
WBC: 13 10*3/uL — ABNORMAL HIGH (ref 4.0–10.5)

## 2015-10-16 LAB — POCT I-STAT 4, (NA,K, GLUC, HGB,HCT)
Glucose, Bld: 168 mg/dL — ABNORMAL HIGH (ref 65–99)
HCT: 38 % — ABNORMAL LOW (ref 39.0–52.0)
Hemoglobin: 12.9 g/dL — ABNORMAL LOW (ref 13.0–17.0)
Potassium: 4.3 mmol/L (ref 3.5–5.1)
Sodium: 134 mmol/L — ABNORMAL LOW (ref 135–145)

## 2015-10-16 LAB — BASIC METABOLIC PANEL
Anion gap: 13 (ref 5–15)
BUN: 38 mg/dL — ABNORMAL HIGH (ref 6–20)
CO2: 22 mmol/L (ref 22–32)
Calcium: 8.9 mg/dL (ref 8.9–10.3)
Chloride: 101 mmol/L (ref 101–111)
Creatinine, Ser: 5.64 mg/dL — ABNORMAL HIGH (ref 0.61–1.24)
GFR calc Af Amer: 12 mL/min — ABNORMAL LOW (ref >60)
GFR calc non Af Amer: 11 mL/min — ABNORMAL LOW (ref >60)
Glucose, Bld: 167 mg/dL — ABNORMAL HIGH (ref 65–99)
Potassium: 5.5 mmol/L — ABNORMAL HIGH (ref 3.5–5.1)
Sodium: 136 mmol/L (ref 135–145)

## 2015-10-16 LAB — HEPARIN LEVEL (UNFRACTIONATED)
Heparin Unfractionated: <0.1 IU/mL — ABNORMAL LOW (ref 0.30–0.70)
Heparin Unfractionated: 0.44 IU/mL (ref 0.30–0.70)

## 2015-10-16 LAB — PARATHYROID HORMONE, INTACT (NO CA): PTH: 211 pg/mL — ABNORMAL HIGH (ref 15–65)

## 2015-10-16 SURGERY — INSERTION OF DIALYSIS CATHETER
Anesthesia: General | Laterality: Right

## 2015-10-16 MED ORDER — HEPARIN SODIUM (PORCINE) 1000 UNIT/ML IJ SOLN
INTRAMUSCULAR | Status: AC
  Filled 2015-10-16: qty 1

## 2015-10-16 MED ORDER — PROPOFOL 10 MG/ML IV BOLUS
INTRAVENOUS | Status: DC | PRN
  Administered 2015-10-16: 150 mg via INTRAVENOUS
  Administered 2015-10-16: 50 mg via INTRAVENOUS

## 2015-10-16 MED ORDER — PROPOFOL 10 MG/ML IV BOLUS
INTRAVENOUS | Status: AC
  Filled 2015-10-16: qty 20

## 2015-10-16 MED ORDER — LIDOCAINE HCL (CARDIAC) 20 MG/ML IV SOLN
INTRAVENOUS | Status: DC | PRN
  Administered 2015-10-16: 60 mg via INTRAVENOUS

## 2015-10-16 MED ORDER — PHENYLEPHRINE HCL 10 MG/ML IJ SOLN
INTRAMUSCULAR | Status: DC | PRN
  Administered 2015-10-16 (×2): 120 ug via INTRAVENOUS
  Administered 2015-10-16: 80 ug via INTRAVENOUS
  Administered 2015-10-16: 120 ug via INTRAVENOUS
  Administered 2015-10-16: 80 ug via INTRAVENOUS
  Administered 2015-10-16 (×2): 120 ug via INTRAVENOUS

## 2015-10-16 MED ORDER — MIDAZOLAM HCL 2 MG/2ML IJ SOLN
INTRAMUSCULAR | Status: AC
  Filled 2015-10-16: qty 2

## 2015-10-16 MED ORDER — HEPARIN SODIUM (PORCINE) 5000 UNIT/ML IJ SOLN
INTRAMUSCULAR | Status: DC | PRN
  Administered 2015-10-16: 10:00:00

## 2015-10-16 MED ORDER — SODIUM CHLORIDE 0.9 % IV SOLN
INTRAVENOUS | Status: DC
  Administered 2015-10-16 – 2015-10-18 (×2): via INTRAVENOUS

## 2015-10-16 MED ORDER — FENTANYL CITRATE (PF) 250 MCG/5ML IJ SOLN
INTRAMUSCULAR | Status: AC
  Filled 2015-10-16: qty 5

## 2015-10-16 MED ORDER — DARBEPOETIN ALFA 100 MCG/0.5ML IJ SOSY
PREFILLED_SYRINGE | INTRAMUSCULAR | Status: AC
  Filled 2015-10-16: qty 0.5

## 2015-10-16 MED ORDER — ONDANSETRON HCL 4 MG/2ML IJ SOLN
INTRAMUSCULAR | Status: DC | PRN
  Administered 2015-10-16: 4 mg via INTRAVENOUS

## 2015-10-16 MED ORDER — HEPARIN SODIUM (PORCINE) 1000 UNIT/ML IJ SOLN
INTRAMUSCULAR | Status: DC | PRN
  Administered 2015-10-16: 3.4 mL

## 2015-10-16 MED ORDER — FENTANYL CITRATE (PF) 100 MCG/2ML IJ SOLN
INTRAMUSCULAR | Status: DC | PRN
  Administered 2015-10-16: 50 ug via INTRAVENOUS
  Administered 2015-10-16: 25 ug via INTRAVENOUS
  Administered 2015-10-16 (×2): 50 ug via INTRAVENOUS

## 2015-10-16 MED ORDER — LIDOCAINE HCL (PF) 1 % IJ SOLN
INTRAMUSCULAR | Status: AC
  Filled 2015-10-16: qty 30

## 2015-10-16 MED ORDER — HEPARIN (PORCINE) IN NACL 100-0.45 UNIT/ML-% IJ SOLN
1650.0000 [IU]/h | INTRAMUSCULAR | Status: DC
  Administered 2015-10-16 – 2015-10-18 (×3): 1400 [IU]/h via INTRAVENOUS
  Administered 2015-10-19: 1300 [IU]/h via INTRAVENOUS
  Administered 2015-10-21 – 2015-10-23 (×3): 1800 [IU]/h via INTRAVENOUS
  Filled 2015-10-16 (×16): qty 250

## 2015-10-16 SURGICAL SUPPLY — 36 items
BAG DECANTER FOR FLEXI CONT (MISCELLANEOUS) ×2 IMPLANT
BIOPATCH RED 1 DISK 7.0 (GAUZE/BANDAGES/DRESSINGS) ×2 IMPLANT
CATH PALINDROME RT-P 15FX19CM (CATHETERS) IMPLANT
CATH PALINDROME RT-P 15FX23CM (CATHETERS) ×2 IMPLANT
CATH PALINDROME RT-P 15FX28CM (CATHETERS) IMPLANT
CATH PALINDROME RT-P 15FX55CM (CATHETERS) IMPLANT
CATH STRAIGHT 5FR 65CM (CATHETERS) IMPLANT
CHLORAPREP W/TINT 26ML (MISCELLANEOUS) ×2 IMPLANT
COVER PROBE W GEL 5X96 (DRAPES) IMPLANT
DECANTER SPIKE VIAL GLASS SM (MISCELLANEOUS) IMPLANT
DRAPE C-ARM 42X72 X-RAY (DRAPES) ×2 IMPLANT
DRAPE CHEST BREAST 15X10 FENES (DRAPES) ×2 IMPLANT
GAUZE SPONGE 2X2 8PLY STRL LF (GAUZE/BANDAGES/DRESSINGS) ×1 IMPLANT
GAUZE SPONGE 4X4 12PLY STRL (GAUZE/BANDAGES/DRESSINGS) ×2 IMPLANT
GAUZE SPONGE 4X4 16PLY XRAY LF (GAUZE/BANDAGES/DRESSINGS) ×2 IMPLANT
GLOVE BIO SURGEON STRL SZ7.5 (GLOVE) ×2 IMPLANT
GOWN STRL REUS W/ TWL LRG LVL3 (GOWN DISPOSABLE) ×2 IMPLANT
GOWN STRL REUS W/TWL LRG LVL3 (GOWN DISPOSABLE) ×2
KIT BASIN OR (CUSTOM PROCEDURE TRAY) ×2 IMPLANT
KIT ROOM TURNOVER OR (KITS) ×2 IMPLANT
NEEDLE 18GX1X1/2 (RX/OR ONLY) (NEEDLE) ×4 IMPLANT
NEEDLE HYPO 25GX1X1/2 BEV (NEEDLE) ×2 IMPLANT
NS IRRIG 1000ML POUR BTL (IV SOLUTION) ×2 IMPLANT
PACK SURGICAL SETUP 50X90 (CUSTOM PROCEDURE TRAY) ×2 IMPLANT
PAD ARMBOARD 7.5X6 YLW CONV (MISCELLANEOUS) ×4 IMPLANT
SET MICROPUNCTURE 5F STIFF (MISCELLANEOUS) IMPLANT
SPONGE GAUZE 2X2 STER 10/PKG (GAUZE/BANDAGES/DRESSINGS) ×1
SUT ETHILON 3 0 PS 1 (SUTURE) ×4 IMPLANT
SUT VICRYL 4-0 PS2 18IN ABS (SUTURE) ×2 IMPLANT
SYR 20CC LL (SYRINGE) ×4 IMPLANT
SYR 5ML LL (SYRINGE) ×4 IMPLANT
SYR CONTROL 10ML LL (SYRINGE) ×2 IMPLANT
SYRINGE 10CC LL (SYRINGE) ×2 IMPLANT
TAPE CLOTH SURG 4X10 WHT LF (GAUZE/BANDAGES/DRESSINGS) ×2 IMPLANT
WATER STERILE IRR 1000ML POUR (IV SOLUTION) ×2 IMPLANT
WIRE AMPLATZ SS-J .035X180CM (WIRE) IMPLANT

## 2015-10-16 NOTE — Progress Notes (Signed)
Occupational Therapy Session Note  Patient Details  Name: Higginson MRN: [id number] Date of Birth: 28-Oct-1965  Today's Date: 10/16/2015 OT Individual Time: 7471-5953 OT Individual Time Calculation (min): 25 min    Short Term Goals: Week 1:  OT Short Term Goal 1 (Week 1): Pt will complete toilet transfer with min assist OT Short Term Goal 2 (Week 1): Pt will complete LB dressing with mod assist OT Short Term Goal 3 (Week 1): Pt will complete UB dressing with min assist OT Short Term Goal 4 (Week 1): Pt will engage in functional activity in standing for 1 minute with min asisst  Skilled Therapeutic Interventions/Progress Updates:    Pt worked on bed mobility supine to sit with mod demonstrational cueing.  Demonstrates decreased understanding of directions at times.  Pt was able to bridge X 2 to allow for more room to work on bed mobility.  On third attempt however he continually attempted to move his shoulders instead of his hips, even though the directions were the same and pt had just completed it twice.  Mod demonstrational cueing for sequencing rolling and sidelying to sit.  Once in sitting worked on RUE functional movement using towel over bedside table.  He was able to initiate and complete slight shoulder flexion, as well as internal and external rotation with min facilitation.  Trace digit flexion also present but no active digit extension.  Pt completed stand step to get higher up in the bed before laying down.  Max demonstrational cueing for technique and sequencing sit to stand with mod assist to stand and take 2-3 small steps.  Pt transitioned back to supine with mod assist as he was going for surgical procedure.    Therapy Documentation Precautions:  Precautions Precautions: Fall Precaution Comments: expressive/ receptive difficulties Restrictions Weight Bearing Restrictions: No General: General OT Amount of Missed Time: 35 Minutes  Pain: Pain Assessment Pain Assessment:  No/denies pain ADL: See Function Navigator for Current Functional Status.   Therapy/Group: Individual Therapy  Kaylor Maiers OTR/L 10/16/2015, 8:30 AM

## 2015-10-16 NOTE — Op Note (Signed)
Procedure: Ultrasound-guided insertion of Palindrome catheter  Preoperative diagnosis: End-stage renal disease  Postoperative diagnosis: Same  Anesthesia: General anesthesia  Operative findings: 23 cm Palindrome catheter right internal jugular vein  Operative details: After obtaining informed consent, the patient was taken to the operating room. The patient was placed in supine position on the operating room table. After adequate sedation the patient's entire neck and chest were prepped and draped in usual sterile fashion. The patient was placed in Trendelenburg position. Ultrasound was used to identify the patient's right internal jugular vein. This had normal compressibility and respiratory variation. Using ultrasound guidance, the right internal jugular vein was successfully cannulated.  A 0.035 J-tipped guidewire was threaded into the right internal jugular vein and into the superior vena cava followed by the inferior vena cava under fluoroscopic guidance.   A preexisting temporary catheter in the right internal jugular vein had sutures removed and I removed the catheter obtaining hemostasis after 5 minutes of direct pressure.  Next sequential 12 and 14 dilators were placed over the guidewire into the right atrium.  A 16 French dilator with a peel-away sheath was then placed over the guidewire into the right atrium.   The guidewire and dilator were removed. A 23 cm Palindrome catheter was then placed through the peel away sheath into the right atrium.  The catheter was then tunneled subcutaneously, cut to length, and the hub attached. The catheter was noted to flush and draw easily. The catheter was inspected under fluoroscopy and found with its tip to be in the right atrium without any kinks throughout its course. The catheter was sutured to the skin with nylon sutures. The neck insertion site was closed with a nylon stitch. The catheter was then loaded with concentrated Heparin solution. A dry  sterile dressing was applied.  The patient tolerated procedure well and there were no complications. Instrument sponge and needle counts correct in the case. The patient was taken to the recovery room in stable condition. Chest x-ray will be obtained in the recovery room.  Ruta Hinds, MD [facility] Office: [phone] Pager: [phone]

## 2015-10-16 NOTE — Progress Notes (Signed)
Occupational Therapy Note  Patient Details  Name: Isaiah [name] [facility] MRN: [id number] Date of Birth: 03-06-1966  Today's Date: 10/16/2015 OT Individual Time: 1330-1400 OT Individual Time Calculation (min): 30 min   Pt denied pain Individual therapy  Pt resting in bed upon arrival.  Pt had just returned from procedure.  Focus on RUE NMR. Pt noted with active shoulder flexion/extension, elbow flexion/extension, and gross grasp.  Pt engaged with repetitive movements to reinforce movement. Educated patient on importance of attention to RUE and continued movement.    Leotis Shames [facility] 10/16/2015, 2:45 PM

## 2015-10-16 NOTE — Progress Notes (Signed)
Speech Language Pathology Daily Session Note  Patient Details  Name: Isaiah [name] [facility] MRN: [id number] Date of Birth: June 25, 1966  Today's Date: 10/16/2015 SLP Individual Time: 1455-1525 SLP Individual Time Calculation (min): 30 min  Short Term Goals: Week 1: SLP Short Term Goal 1 (Week 1): Pt will demonstrate effective mastication of Dys.2 textures evidenced by no more than Min assist verbal cues to monitor residue/pocketing. SLP Short Term Goal 2 (Week 1): Pt will consume current diet with small portions at a slow pace with Min verbal cues. SLP Short Term Goal 3 (Week 1): Pt will follow 2 step directions with Min verbal and visual cues.  SLP Short Term Goal 4 (Week 1): Pt will self-monitor verbal perseveration with Max assist multimodal cues. SLP Short Term Goal 5 (Week 1): Pt will name familiar objects with Mod assist semantic and phonemic cues.  SLP Short Term Goal 6 (Week 1): Pt will utilize over articulation for production of phonemes at the CV, CVC/word level for accurate productions of vowels and bilabials with Min verbal, visual cues   Skilled Therapeutic Interventions: Skilled treatment session focused on functional communication. Upon arrival, patient was awake while supine in bed but appeared lethargic, suspect due to procedure. SLP facilitated session by providing Mod-Max A semantic and phonemic cues for naming pictures of familiar items and Max-Total A multimodal cues to identify similarities between the two objects at the word level. Patient demonstrated increased length of utterances during informal conversation with clinician with extra time in regards to biographical information. Patient was~75% intelligible throughout the session. Patient left supine in bed with all needs within reach. Continue with current plan of care.    Function:   Cognition Comprehension Comprehension assist level: Understands basic 50 - 74% of the time/ requires cueing 25 - 49% of the time  Expression    Expression assist level: Expresses basic 50 - 74% of the time/requires cueing 25 - 49% of the time. Needs to repeat parts of sentences.  Social Interaction Social Interaction assist level: Interacts appropriately 75 - 89% of the time - Needs redirection for appropriate language or to initiate interaction.  Problem Solving Problem solving assist level: Solves basic 50 - 74% of the time/requires cueing 25 - 49% of the time  Memory Memory assist level: Recognizes or recalls 50 - 74% of the time/requires cueing 25 - 49% of the time    Pain Pain Assessment Pain Assessment: No/denies pain Pain Score: 0-No pain  Therapy/Group: Individual Therapy  Baltazar Pekala 10/16/2015, 3:55 PM

## 2015-10-16 NOTE — Anesthesia Preprocedure Evaluation (Signed)
Anesthesia Evaluation  Patient identified by MRN, date of birth, ID band Patient awake    Reviewed: Allergy & Precautions, NPO status , Patient's Chart, lab work & pertinent test results  Airway Mallampati: II   Neck ROM: full    Dental   Pulmonary shortness of breath, asthma , sleep apnea ,    breath sounds clear to auscultation       Cardiovascular hypertension, + Peripheral Vascular Disease   Rhythm:regular Rate:Normal     Neuro/Psych CVA    GI/Hepatic   Endo/Other  diabetes, Type 2  Renal/GU ESRF and DialysisRenal disease     Musculoskeletal   Abdominal   Peds  Hematology   Anesthesia Other Findings   Reproductive/Obstetrics                             Anesthesia Physical Anesthesia Plan  ASA: III  Anesthesia Plan: MAC   Post-op Pain Management:    Induction: Intravenous  Airway Management Planned: Simple Face Mask  Additional Equipment:   Intra-op Plan:   Post-operative Plan:   Informed Consent: I have reviewed the patients History and Physical, chart, labs and discussed the procedure including the risks, benefits and alternatives for the proposed anesthesia with the patient or authorized representative who has indicated his/her understanding and acceptance.     Plan Discussed with: CRNA, Anesthesiologist and Surgeon  Anesthesia Plan Comments:         Anesthesia Quick Evaluation

## 2015-10-16 NOTE — Anesthesia Postprocedure Evaluation (Signed)
Anesthesia Post Note  Patient: Isaiah [name]  Procedure(s) Performed: Procedure(s) (LRB): INSERTION OF PALINDROME DIALYSIS CATHETER  (Right)  Patient location during evaluation: PACU Anesthesia Type: General Level of consciousness: awake and alert and patient cooperative Pain management: pain level controlled Vital Signs Assessment: post-procedure vital signs reviewed and stable Respiratory status: spontaneous breathing and respiratory function stable Cardiovascular status: stable Anesthetic complications: no    Last Vitals:  Filed Vitals:   10/16/15 1558 10/16/15 1603  BP: 135/85 131/93  Pulse: 94 87  Temp:    Resp:      Last Pain:  Filed Vitals:   10/16/15 1612  PainSc: 0-No pain                 Annaleese Guier S

## 2015-10-16 NOTE — Progress Notes (Signed)
Subjective/Complaints: Remains aphasic, Comprehension looks good however house commands. Have reviewed notes from hematology, vascular surgery, nephrology. Wife is requesting neurology consultation. We are waiting imaging studies from hospital in [place]- cannot complete, aphasic  Objective: Vital Signs: Blood pressure 119/85, pulse 87, temperature 98.3 F (36.8 C), temperature source Oral, resp. rate 18, height 5' 11" (1.803 m), weight 96.5 kg (212 lb 11.9 oz), SpO2 98 %. Dg Chest Port 1 View  10/15/2015  CLINICAL DATA:  Preoperative cardiovascular exam, IVC filter, diabetes mellitus, hypertension, asthma, stage IV chronic kidney disease EXAM: PORTABLE CHEST 1 VIEW COMPARISON:  Portable exam 1658 hours compared to 09/27/2014 FINDINGS: RIGHT jugular central venous catheter with tip projecting over RIGHT atrium. Upper normal size of cardiac silhouette. Mediastinal contours and pulmonary vascularity normal. Lungs clear. No pleural effusion or pneumothorax. Bones demineralized. IMPRESSION: No acute abnormalities. Electronically Signed   By: Lavonia Dana M.D.   On: 10/15/2015 17:13   Dg Abd Portable 1v  10/15/2015  CLINICAL DATA:  Check IVC filter placement. EXAM: PORTABLE ABDOMEN - 1 VIEW COMPARISON:  None. FINDINGS: IVC filter projects at infrarenal location at the L2 vertebral body level. Cholecystectomy clips noted. Moderate volume stool. IMPRESSION: IVC filter at L2. Electronically Signed   By: Suzy Bouchard M.D.   On: 10/15/2015 17:13   Results for orders placed or performed during the hospital encounter of 10/12/15 (from the past 72 hour(s))  Heparin level (unfractionated)     Status: Abnormal   Collection Time: 10/13/15 11:53 AM  Result Value Ref Range   Heparin Unfractionated 0.18 (L) 0.30 - 0.70 IU/mL    Comment:        IF HEPARIN RESULTS ARE BELOW EXPECTED VALUES, AND PATIENT DOSAGE HAS BEEN CONFIRMED, SUGGEST FOLLOW UP TESTING OF ANTITHROMBIN III LEVELS.   Parathyroid  hormone, intact (no Ca)     Status: Abnormal   Collection Time: 10/13/15 11:53 AM  Result Value Ref Range   PTH 211 (H) 15 - 65 pg/mL    Comment: (NOTE) Performed At: [facility] [place], [place] [id number] Lindon Romp MD [id number]   Glucose, capillary     Status: Abnormal   Collection Time: 10/13/15 12:15 PM  Result Value Ref Range   Glucose-Capillary 360 (H) 65 - 99 mg/dL  Renal function panel     Status: Abnormal   Collection Time: 10/13/15  3:15 PM  Result Value Ref Range   Sodium 129 (L) 135 - 145 mmol/L   Potassium 4.7 3.5 - 5.1 mmol/L   Chloride 94 (L) 101 - 111 mmol/L   CO2 23 22 - 32 mmol/L   Glucose, Bld 426 (H) 65 - 99 mg/dL   BUN 52 (H) 6 - 20 mg/dL   Creatinine, Ser 6.42 (H) 0.61 - 1.24 mg/dL   Calcium 9.1 8.9 - 10.3 mg/dL   Phosphorus 3.0 2.5 - 4.6 mg/dL   Albumin 2.7 (L) 3.5 - 5.0 g/dL   GFR calc non Af Amer 9 (L) >60 mL/min   GFR calc Af Amer 10 (L) >60 mL/min    Comment: (NOTE) The eGFR has been calculated using the CKD EPI equation. This calculation has not been validated in all clinical situations. eGFR's persistently <60 mL/min signify possible Chronic Kidney Disease.    Anion gap 12 5 - 15  Hepatitis B surface antigen     Status: None   Collection Time: 10/13/15  3:15 PM  Result Value Ref Range   Hepatitis B Surface Ag Negative  Negative    Comment: (NOTE) Performed At: [facility] [place], [place] [id number] Lindon Romp MD OQ:[id number]   Hepatitis B surface antibody     Status: None   Collection Time: 10/13/15  3:15 PM  Result Value Ref Range   Hep B S Ab Non Reactive     Comment: (NOTE)              Non Reactive: Inconsistent with immunity,                            less than 10 mIU/mL              Reactive:     Consistent with immunity,                            greater than 9.9 mIU/mL Performed At: [name] [place], [place] [id number] Lindon Romp MD [id number]   Hepatitis B core antibody, total     Status: None   Collection Time: 10/13/15  3:15 PM  Result Value Ref Range   Hep B Core Total Ab Negative Negative    Comment: (NOTE) Performed At: [facility] [place], [place] [id number] Lindon Romp MD YK:[id number]   Glucose, capillary     Status: Abnormal   Collection Time: 10/13/15  5:19 PM  Result Value Ref Range   Glucose-Capillary 142 (H) 65 - 99 mg/dL  Glucose, capillary     Status: Abnormal   Collection Time: 10/13/15  8:22 PM  Result Value Ref Range   Glucose-Capillary 134 (H) 65 - 99 mg/dL  Glucose, capillary     Status: Abnormal   Collection Time: 10/13/15  9:21 PM  Result Value Ref Range   Glucose-Capillary 205 (H) 65 - 99 mg/dL  Heparin level (unfractionated)     Status: None   Collection Time: 10/13/15 10:26 PM  Result Value Ref Range   Heparin Unfractionated 0.64 0.30 - 0.70 IU/mL    Comment:        IF HEPARIN RESULTS ARE BELOW EXPECTED VALUES, AND PATIENT DOSAGE HAS BEEN CONFIRMED, SUGGEST FOLLOW UP TESTING OF ANTITHROMBIN III LEVELS.   Glucose, capillary     Status: Abnormal   Collection Time: 10/14/15  6:40 AM  Result Value Ref Range   Glucose-Capillary 255 (H) 65 - 99 mg/dL  Glucose, capillary     Status: Abnormal   Collection Time: 10/14/15  9:01 AM  Result Value Ref Range   Glucose-Capillary 240 (H) 65 - 99 mg/dL  CBC     Status: Abnormal   Collection Time: 10/14/15  9:45 AM  Result Value Ref Range   WBC 16.1 (H) 4.0 - 10.5 K/uL   RBC 3.73 (L) 4.22 - 5.81 MIL/uL   Hemoglobin 11.0 (L) 13.0 - 17.0 g/dL   HCT 32.4 (L) 39.0 - 52.0 %   MCV 86.9 78.0 - 100.0 fL   MCH 29.5 26.0 - 34.0 pg   MCHC 34.0 30.0 - 36.0 g/dL   RDW 18.4 (H) 11.5 - 15.5 %   Platelets 335 150 - 400 K/uL  Heparin level (unfractionated)     Status: None   Collection Time: 10/14/15  9:45 AM  Result Value Ref Range   Heparin Unfractionated 0.39 0.30 - 0.70 IU/mL    Comment:        IF  HEPARIN RESULTS  ARE BELOW EXPECTED VALUES, AND PATIENT DOSAGE HAS BEEN CONFIRMED, SUGGEST FOLLOW UP TESTING OF ANTITHROMBIN III LEVELS.   Glucose, capillary     Status: Abnormal   Collection Time: 10/14/15 11:49 AM  Result Value Ref Range   Glucose-Capillary 292 (H) 65 - 99 mg/dL  Glucose, capillary     Status: Abnormal   Collection Time: 10/14/15  4:43 PM  Result Value Ref Range   Glucose-Capillary 301 (H) 65 - 99 mg/dL  Urinalysis, Routine w reflex microscopic (not at [facility])     Status: Abnormal   Collection Time: 10/14/15  7:36 PM  Result Value Ref Range   Color, Urine YELLOW YELLOW   APPearance CLEAR CLEAR   Specific Gravity, Urine 1.011 1.005 - 1.030   pH 6.0 5.0 - 8.0   Glucose, UA 500 (A) NEGATIVE mg/dL   Hgb urine dipstick NEGATIVE NEGATIVE   Bilirubin Urine NEGATIVE NEGATIVE   Ketones, ur NEGATIVE NEGATIVE mg/dL   Protein, ur 100 (A) NEGATIVE mg/dL   Nitrite NEGATIVE NEGATIVE   Leukocytes, UA NEGATIVE NEGATIVE  Urine microscopic-add on     Status: Abnormal   Collection Time: 10/14/15  7:36 PM  Result Value Ref Range   Squamous Epithelial / LPF 0-5 (A) NONE SEEN   WBC, UA NONE SEEN 0 - 5 WBC/hpf   RBC / HPF NONE SEEN 0 - 5 RBC/hpf   Bacteria, UA NONE SEEN NONE SEEN  Glucose, capillary     Status: Abnormal   Collection Time: 10/14/15  9:33 PM  Result Value Ref Range   Glucose-Capillary 280 (H) 65 - 99 mg/dL  Glucose, capillary     Status: Abnormal   Collection Time: 10/15/15  6:36 AM  Result Value Ref Range   Glucose-Capillary 158 (H) 65 - 99 mg/dL  CBC     Status: Abnormal   Collection Time: 10/15/15  6:45 AM  Result Value Ref Range   WBC 14.2 (H) 4.0 - 10.5 K/uL   RBC 3.51 (L) 4.22 - 5.81 MIL/uL   Hemoglobin 10.2 (L) 13.0 - 17.0 g/dL   HCT 30.1 (L) 39.0 - 52.0 %   MCV 85.8 78.0 - 100.0 fL   MCH 29.1 26.0 - 34.0 pg   MCHC 33.9 30.0 - 36.0 g/dL   RDW 18.2 (H) 11.5 - 15.5 %   Platelets 342 150 - 400 K/uL  Heparin level (unfractionated)     Status: None    Collection Time: 10/15/15  6:45 AM  Result Value Ref Range   Heparin Unfractionated 0.51 0.30 - 0.70 IU/mL    Comment:        IF HEPARIN RESULTS ARE BELOW EXPECTED VALUES, AND PATIENT DOSAGE HAS BEEN CONFIRMED, SUGGEST FOLLOW UP TESTING OF ANTITHROMBIN III LEVELS.   Glucose, capillary     Status: Abnormal   Collection Time: 10/15/15 11:26 AM  Result Value Ref Range   Glucose-Capillary 203 (H) 65 - 99 mg/dL  Glucose, capillary     Status: Abnormal   Collection Time: 10/15/15  4:51 PM  Result Value Ref Range   Glucose-Capillary 303 (H) 65 - 99 mg/dL  Surgical pcr screen     Status: None   Collection Time: 10/15/15  7:38 PM  Result Value Ref Range   MRSA, PCR NEGATIVE NEGATIVE   Staphylococcus aureus NEGATIVE NEGATIVE    Comment:        The Xpert SA Assay (FDA approved for NASAL specimens in patients over 48 years of age), is one component of a comprehensive surveillance  program.  Test performance has been validated by [facility] for patients greater than or equal to 43 year old. It is not intended to diagnose infection nor to guide or monitor treatment.   Glucose, capillary     Status: Abnormal   Collection Time: 10/15/15  9:15 PM  Result Value Ref Range   Glucose-Capillary 287 (H) 65 - 99 mg/dL  Glucose, capillary     Status: Abnormal   Collection Time: 10/16/15  6:57 AM  Result Value Ref Range   Glucose-Capillary 152 (H) 65 - 99 mg/dL      General: No acute distress Mood and affect are appropriate Heart: Regular rate and rhythm no rubs murmurs or extra sounds Lungs: Clear to auscultation, breathing unlabored, no rales or wheezes Abdomen: Positive bowel sounds, soft nontender to palpation, nondistended Extremities: No clubbing, cyanosis, or edema Skin: No evidence of breakdown, no evidence of rash Neurologic: Cranial nerves II through XII intact, motor strength is 5/5 in left deltoid, bicep, tricep, grip, hip flexor, knee extensors, ankle dorsiflexor and plantar  flexor 2 minus in the right deltoid, biceps, triceps, grip, 3 minus left hip flexor and extensor, 2 minus left ankle dorsiflexor Sensory examDifficult to assess secondary to a aphasia, does feel pinch in all 4 limbs Cerebellar exam normal finger to nose to finger as well as heel to shin in bilateral upper and lower extremities Musculoskeletal: Full range of motion in all 4 extremities. No joint swelling   Assessment/Plan: 1. 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 Physiatrist is providing close team supervision and 24 hour management of active medical problems listed below. Physiatrist and rehab team continue to assess barriers to discharge/monitor patient progress toward functional and medical goals. FIM: Function - Bathing Position: Wheelchair/chair at sink (Did LB supine and UB in wc) Body parts bathed by patient: Right arm, Chest, Abdomen, Front perineal area, Right upper leg, Left upper leg Body parts bathed by helper: Right arm, Left arm, Front perineal area, Buttocks, Back Assist Level: Touching or steadying assistance(Pt > 75%)  Function- Upper Body Dressing/Undressing What is the patient wearing?: Pull over shirt/dress Pull over shirt/dress - Perfomed by patient: Thread/unthread left sleeve, Pull shirt over trunk Pull over shirt/dress - Perfomed by helper: Pull shirt over trunk, Thread/unthread right sleeve Assist Level: Touching or steadying assistance(Pt > 75%) Function - Lower Body Dressing/Undressing What is the patient wearing?: Non-skid slipper socks, Pants Pants- Performed by helper: Thread/unthread right pants leg, Thread/unthread left pants leg, Pull pants up/down Non-skid slipper socks- Performed by helper: Don/doff right sock, Don/doff left sock Assist for footwear: Dependant Assist for lower body  dressing: Touching or steadying assistance (Pt > 75%)        Function - Chair/bed transfer Chair/bed transfer method: Stand pivot Chair/bed transfer assist level: Maximal assist (Pt 25 - 49%/lift and lower) Chair/bed transfer details: Verbal cues for sequencing, Verbal cues for technique, Verbal cues for precautions/safety, Manual facilitation for weight shifting, Manual facilitation for placement  Function - Locomotion: Wheelchair Will patient use wheelchair at discharge?: No Type: Manual Max wheelchair distance: 25 Assist Level: Maximal assistance (Pt 25 - 49%) Wheel 50 feet with 2 turns activity did not occur: Safety/medical concerns Wheel 150 feet activity did not occur: Safety/medical concerns Turns around,maneuvers to table,bed, and toilet,negotiates 3% grade,maneuvers on rugs and over doorsills: No Function - Locomotion: Ambulation Ambulation activity did not occur:  Safety/medical concerns Assistive device: Walker-rolling Max distance: 50 Assist level: 2 helpers Assist level: Moderate assist (Pt 50 - 74%) Assist level: Moderate assist (Pt 50 - 74%) Walk 150 feet activity did not occur: Safety/medical concerns Walk 10 feet on uneven surfaces activity did not occur: Safety/medical concerns  Function - Comprehension Comprehension: Auditory Comprehension assist level: Understands basic 75 - 89% of the time/ requires cueing 10 - 24% of the time  Function - Expression Expression: Verbal Expression assist level: Expresses basic 50 - 74% of the time/requires cueing 25 - 49% of the time. Needs to repeat parts of sentences.  Function - Social Interaction Social Interaction assist level: Interacts appropriately 75 - 89% of the time - Needs redirection for appropriate language or to initiate interaction.  Function - Problem Solving Problem solving assist level: Solves basic 50 - 74% of the time/requires cueing 25 - 49% of the time  Function - Memory Memory assist level:  Recognizes or recalls 50 - 74% of the time/requires cueing 25 - 49% of the time Patient normally able to recall (first 3 days only): That he or she is in a hospital, Current season  Medical Problem List and Plan: 1. Right hemiparesis and aphasia secondary to bi-cerebral embolic infarcts (L>R)- Team conference in a.m. 2. PE/DVT/Stoke/Anticoagulation: Pharmaceutical: Lovenox bid per records from [place].  use iv heparin gtt at this time. per hematologist Dr Alen Blew , Long-term anticoagulation will be decided next week 3. Pain Management: Ultram prn.  4. Mood: LCSW to follow for evaluation and support.  5. Neuropsych: This patient is not fully capable of making decisions on his own behalf. 6. Skin/Wound Care: Routine pressure relief measures.  7. Fluids/Electrolytes/Nutrition: Renal restrictions. 1200 cc/FR.  8. DM type 2: Monitor BS ac/hs. Continue levemir and use SSI for elevated BS.A.m. CBG improved but still elevated we will increase Levemir 9. HTN: Monitor BP bid and titrate medications as needed. Elevated at admission. Will set parameters on Midodrine.  10. ESRD on HD: per [facility]. HD tomorrow after therapies. Continue diet/volume mgt, Tunnel dialysis catheter to be inserted by [facility] today, Heparin will be held for procedure      11. Leukocytosis will check UA,no breathing difficulties or coughing.UA negative except for elevated protein as expected from renal failure 12.  Hx of GI bleed will monitor for reurrence, check Hgb in HD LOS (Days) 4 A FACE TO FACE EVALUATION WAS PERFORMED  Isaiah [name] 10/16/2015, 8:17 AM

## 2015-10-16 NOTE — Progress Notes (Signed)
Physical Therapy Session Note  Patient Details  Name: Isaiah [name] MRN: [id number] Date of Birth: [birth year] General: PT Amount of Missed Time (min): 90 Minutes PT Missed Treatment Reason: Unavailable (Comment) (off unit for procedure)  DONAWERTH,KAREN 10/16/2015, 2:53 PM

## 2015-10-16 NOTE — Interval H&P Note (Signed)
History and Physical Interval Note:  10/16/2015 8:45 AM  Isaiah [name] [facility]  has presented today for surgery, with the diagnosis of Chronic kidney disease   The various methods of treatment have been discussed with the patient and family. After consideration of risks, benefits and other options for treatment, the patient has consented to  Procedure(s): INSERTION OF PALINDROME DIALYSIS CATHETER  (N/A) as a surgical intervention .  The patient's history has been reviewed, patient examined, no change in status, stable for surgery.  I have reviewed the patient's chart and labs.  Questions were answered to the patient's satisfaction.     Ruta Hinds

## 2015-10-16 NOTE — Progress Notes (Signed)
ANTICOAGULATION CONSULT NOTE - Follow Up Consult  Pharmacy Consult for heparin Indication: hx PE/CVA/hx hypercoagulable state  Allergies  Allergen Reactions  . Codeine Other (See Comments)    Pt unknown  . Codeine Rash and Other (See Comments)    Unknown reaction (patient says it was more serious than just a rash, but he can't remember what happened)    Patient Measurements: Height: 5' 11"  (180.3 cm) Weight: 212 lb 11.9 oz (96.5 kg) (With patients own pad/slip in bed) IBW/kg (Calculated) : 75.3 Heparin Dosing Weight:   Vital Signs: Temp: 98.4 F (36.9 C) (02/07 1213) Temp Source: Oral (02/07 0607) BP: 131/98 mmHg (02/07 1213) Pulse Rate: 84 (02/07 1213)  Labs:  Recent Labs  10/13/15 1515 10/13/15 2226  10/14/15 0945 10/15/15 0645 10/16/15 0916 10/16/15 1156  HGB  --   --   < > 11.0* 10.2* 12.9* 10.8*  HCT  --   --   < > 32.4* 30.1* 38.0* 32.6*  PLT  --   --   --  335 342  --  328  HEPARINUNFRC  --  0.64  --  0.39 0.51  --   --   CREATININE 6.42*  --   --   --   --   --  5.64*  < > = values in this interval not displayed.  Estimated Creatinine Clearance: 18.4 mL/min (by C-G formula based on Cr of 5.64).   Medications:  Scheduled:  . antiseptic oral rinse  7 mL Mouth Rinse BID  . aspirin EC  81 mg Oral Daily  . darbepoetin (ARANESP) injection - DIALYSIS  100 mcg Intravenous Q Tue-HD  . Fluticasone Furoate-Vilanterol  1 puff Inhalation Daily  . insulin aspart  0-5 Units Subcutaneous QHS  . insulin aspart  0-9 Units Subcutaneous TID WC  . insulin detemir  20 Units Subcutaneous QHS  . midodrine  5 mg Oral TID WC  . montelukast  10 mg Oral QHS  . multivitamin  1 tablet Oral QHS  . pantoprazole  40 mg Oral BID   Infusions:  . sodium chloride 10 mL/hr at 10/16/15 9242  . heparin      Assessment: 50 yo male with hx PE/CVA/hx hypercoagulable state will be resumed on heparin s/p tunneled cath placement.  Vascular ok to restart heparin now.  Goal of Therapy:   Heparin level 0.3-0.5 units/ml Monitor platelets by anticoagulation protocol: Yes   Plan:  - resume heparin at 1400 units/hr now. - 8hr heparin level  Charee Tumblin, Tsz-Yin 10/16/2015,12:45 PM

## 2015-10-16 NOTE — Progress Notes (Signed)
CXR report:  CLINICAL DATA: Patient status post dialysis catheter insertion.  EXAM: PORTABLE CHEST 1 VIEW  COMPARISON: Chest radiograph 10/15/2015  FINDINGS: Central venous catheter tip projects over the right atrium. Stable cardiomegaly. No consolidative pulmonary opacities. No pleural effusion or pneumothorax. Right shoulder joint degenerative changes.  IMPRESSION: Central venous catheter tip projects over the right atrium. Cardiomegaly. No acute cardiopulmonary process.   Electronically Signed  By: Lovey Newcomer M.D.  On: 10/16/2015 11:51

## 2015-10-16 NOTE — Progress Notes (Signed)
S: No new CO.  Had perm cath put in earlier today O:BP 138/85 mmHg  Pulse 87  Temp(Src) 98.5 F (36.9 C) (Oral)  Resp 18  Ht 5' 11"  (1.803 m)  Wt 96.5 kg (212 lb 11.9 oz)  BMI 29.68 kg/m2  SpO2 100%  Intake/Output Summary (Last 24 hours) at 10/16/15 1542 Last data filed at 10/16/15 1241  Gross per 24 hour  Intake    620 ml  Output   1100 ml  Net   -480 ml   Weight change: 2.5 kg (5 lb 8.2 oz) BXU:XYBFX and alert CVS:RRR Resp:clear Abd:+ BS NTND Ext:No edema NEURO: Awake and alert, aphasic Rt permcath   . antiseptic oral rinse  7 mL Mouth Rinse BID  . aspirin EC  81 mg Oral Daily  . darbepoetin (ARANESP) injection - DIALYSIS  100 mcg Intravenous Q Tue-HD  . Fluticasone Furoate-Vilanterol  1 puff Inhalation Daily  . insulin aspart  0-5 Units Subcutaneous QHS  . insulin aspart  0-9 Units Subcutaneous TID WC  . insulin detemir  20 Units Subcutaneous QHS  . midodrine  5 mg Oral TID WC  . montelukast  10 mg Oral QHS  . multivitamin  1 tablet Oral QHS  . pantoprazole  40 mg Oral BID   Dg Chest Port 1 View  10/16/2015  CLINICAL DATA:  Patient status post dialysis catheter insertion. EXAM: PORTABLE CHEST 1 VIEW COMPARISON:  Chest radiograph 10/15/2015 FINDINGS: Central venous catheter tip projects over the right atrium. Stable cardiomegaly. No consolidative pulmonary opacities. No pleural effusion or pneumothorax. Right shoulder joint degenerative changes. IMPRESSION: Central venous catheter tip projects over the right atrium. Cardiomegaly. No acute cardiopulmonary process. Electronically Signed   By: Lovey Newcomer M.D.   On: 10/16/2015 11:51   Dg Chest Port 1 View  10/15/2015  CLINICAL DATA:  Preoperative cardiovascular exam, IVC filter, diabetes mellitus, hypertension, asthma, stage IV chronic kidney disease EXAM: PORTABLE CHEST 1 VIEW COMPARISON:  Portable exam 1658 hours compared to 09/27/2014 FINDINGS: RIGHT jugular central venous catheter with tip projecting over RIGHT atrium.  Upper normal size of cardiac silhouette. Mediastinal contours and pulmonary vascularity normal. Lungs clear. No pleural effusion or pneumothorax. Bones demineralized. IMPRESSION: No acute abnormalities. Electronically Signed   By: Lavonia Dana M.D.   On: 10/15/2015 17:13   Dg Abd Portable 1v  10/15/2015  CLINICAL DATA:  Check IVC filter placement. EXAM: PORTABLE ABDOMEN - 1 VIEW COMPARISON:  None. FINDINGS: IVC filter projects at infrarenal location at the L2 vertebral body level. Cholecystectomy clips noted. Moderate volume stool. IMPRESSION: IVC filter at L2. Electronically Signed   By: Suzy Bouchard M.D.   On: 10/15/2015 17:13   Dg Fluoro Guide Cv Line-no Report  10/16/2015  CLINICAL DATA:  FLOURO GUIDE CV LINE Fluoroscopy was utilized by the requesting physician.  No radiographic interpretation.   BMET    Component Value Date/Time   NA 136 10/16/2015 1156   NA 138 09/06/2015 1306   K 5.5* 10/16/2015 1156   K 4.8 09/06/2015 1306   CL 101 10/16/2015 1156   CO2 22 10/16/2015 1156   CO2 15* 09/06/2015 1306   GLUCOSE 167* 10/16/2015 1156   GLUCOSE 98 09/06/2015 1306   BUN 38* 10/16/2015 1156   BUN 54.0* 09/06/2015 1306   CREATININE 5.64* 10/16/2015 1156   CREATININE 5.8* 09/06/2015 1306   CALCIUM 8.9 10/16/2015 1156   CALCIUM 7.6* 09/06/2015 1306   GFRNONAA 11* 10/16/2015 1156   GFRAA 12* 10/16/2015 1156  CBC    Component Value Date/Time   WBC 13.0* 10/16/2015 1156   WBC 17.1* 09/06/2015 1306   RBC 3.79* 10/16/2015 1156   RBC 4.26 09/06/2015 1306   RBC 3.07* 09/29/2014 1810   HGB 10.8* 10/16/2015 1156   HGB 11.3* 09/06/2015 1306   HCT 32.6* 10/16/2015 1156   HCT 32.8* 09/06/2015 1306   PLT 328 10/16/2015 1156   PLT 322 09/06/2015 1306   MCV 86.0 10/16/2015 1156   MCV 77.0* 09/06/2015 1306   MCH 28.5 10/16/2015 1156   MCH 26.5* 09/06/2015 1306   MCHC 33.1 10/16/2015 1156   MCHC 34.5 09/06/2015 1306   RDW 18.1* 10/16/2015 1156   RDW 19.1* 09/06/2015 1306   LYMPHSABS  1.6 09/06/2015 1306   LYMPHSABS 4.5* 10/04/2012 1340   MONOABS 1.1* 09/06/2015 1306   MONOABS 1.7* 10/04/2012 1340   EOSABS 0.3 09/06/2015 1306   EOSABS 0.8* 10/04/2012 1340   BASOSABS 0.1 09/06/2015 1306   BASOSABS 0.2* 10/04/2012 1340     Assessment:  1. New ESRD 2. Embolic CVA 3. Anemia  Plan: 1. HD today  Zerina Hallinan T

## 2015-10-16 NOTE — Transfer of Care (Signed)
Immediate Anesthesia Transfer of Care Note  Patient: Isaiah [name]  Procedure(s) Performed: Procedure(s): INSERTION OF PALINDROME DIALYSIS CATHETER  (Right)  Patient Location: PACU  Anesthesia Type:General  Level of Consciousness: awake, alert , oriented and patient cooperative  Airway & Oxygen Therapy: Patient Spontanous Breathing and Patient connected to face mask oxygen  Post-op Assessment: Report given to RN and Post -op Vital signs reviewed and stable  Post vital signs: Reviewed and stable  Last Vitals:  Filed Vitals:   10/15/15 1552 10/16/15 0607  BP: 135/96 119/85  Pulse: 100 87  Temp:  36.8 C  Resp:  18    Complications: No apparent anesthesia complications

## 2015-10-16 NOTE — Progress Notes (Signed)
ANTICOAGULATION CONSULT NOTE - Follow Up Consult  Pharmacy Consult for heparin Indication: hx PE/CVA/hx hypercoagulable state  Allergies  Allergen Reactions  . Codeine Other (See Comments)    Pt unknown  . Codeine Rash and Other (See Comments)    Unknown reaction (patient says it was more serious than just a rash, but he can't remember what happened)    Patient Measurements: Height: 5' 11"  (180.3 cm) Weight: 206 lb 5.6 oz (93.6 kg) (additional blankets/pillows removed) IBW/kg (Calculated) : 75.3 Heparin Dosing Weight:   Vital Signs: Temp: 97.8 F (36.6 C) (02/07 1959) Temp Source: Oral (02/07 1959) BP: 128/97 mmHg (02/07 1959) Pulse Rate: 112 (02/07 1959)  Labs:  Recent Labs  10/14/15 0945 10/15/15 0645 10/16/15 0916 10/16/15 1156 10/16/15 1157 10/16/15 2258  HGB 11.0* 10.2* 12.9* 10.8*  --   --   HCT 32.4* 30.1* 38.0* 32.6*  --   --   PLT 335 342  --  328  --   --   HEPARINUNFRC 0.39 0.51  --   --  <0.10* 0.44  CREATININE  --   --   --  5.64*  --   --     Estimated Creatinine Clearance: 18.1 mL/min (by C-G formula based on Cr of 5.64).   Assessment: 50 yo male with hx PE/CVA/hx hypercoagulable state with heparin resumed s/p tunneled cath placement 2/7. Heparin level therapeutic on 1400 units/hr. No bleeding noted.  Goal of Therapy:  Heparin level 0.3-0.5 units/ml Monitor platelets by anticoagulation protocol: Yes   Plan:  - Continue heparin at 1400 units/hr - Daily heparin level and CBC  Sherlon Handing, PharmD, BCPS Clinical pharmacist, pager [phone] 10/16/2015,11:38 PM

## 2015-10-16 NOTE — H&P (View-Only) (Signed)
[facility] [place] HISTORY AND PHYSICAL   Referring Physician: Elmarie Shiley MD History of Present Illness:  Patient is a 50 y.o.  male who presents for evaluation for permanent access.  Pt recently admitted to rehab after acute embolic stroke thought to be due to paradoxical embolus from DVT.  He is currently aphasic with right hemiplegia.  Stroke was around 09/17/15.  He currently is dialyzing via a right side tunneled catheter but is thought to have no renal recovery.  Other medical problems include PE 2012, sleep apnea, hypertension, DM all of which have been stable.  He is currently on heparin pending hematology eval for long term anticoagulation.  He is right handed but has significant right UE deficit currently.  He is able to comprehend and answer yes no questions.  Past Medical History  Diagnosis Date  . Diabetes mellitus without complication   . Sickle cell-thalassemia disease     a. Sickle cell trait.  . Pulmonary embolism 07/2011    a. Tx with Coumadin for 6 months (unknown cause per patient).  . Hyperkalemia 07/2011  . Acute renal failure 07/2011  . Monoclonal gammopathy   . CKD (chronic kidney disease)   . Sleep apnea     a. uses CPAP.  [name] Hypertension   . Sickle cell-thalassemia disease   . Gout   . Pulmonary embolism 07/2011; 09/27/2014    a. Bilat PE 07/2011 - unclear cause, tx with 6 months Coumadin.;   . Monoclonal gammopathies   . Anemia   . Type II diabetes mellitus   . Asthma   . OSA on CPAP   . Sickle-cell thalassemia   . CKD (chronic kidney disease), stage IV   . History of recent blood transfusion 10/27/14    2 Units PRBC's    Past Surgical History  Procedure Laterality Date  . Cholecystectomy    . Bone marrow biopsy    . Other surgical history      Retinal surgery  . Pars plana vitrectomy  02/17/2012    Procedure: PARS PLANA VITRECTOMY WITH 25 GAUGE;  Surgeon: Hayden Pedro, MD;  Location: [facility];  Service: Ophthalmology;  Laterality:  Right;  . Cholecystectomy  1990's?  . Eye surgery Right     Social History Social History  Substance Use Topics  . Smoking status: Never Smoker   . Smokeless tobacco: Never Used  . Alcohol Use: No    Family History Family History  Problem Relation Age of Onset  . Hypertension Mother   . Diabetes Mother   . Sickle cell anemia Brother   . Diabetes type I Brother   . Kidney disease Brother     Allergies  Allergies  Allergen Reactions  . Codeine Other (See Comments)    Pt unknown  . Codeine Rash and Other (See Comments)    Unknown reaction (patient says it was more serious than just a rash, but he can't remember what happened)     Current Facility-Administered Medications  Medication Dose Route Frequency Provider Last Rate Last Dose  . 0.9 %  sodium chloride infusion  100 mL Intravenous PRN Elmarie Shiley, MD      . 0.9 %  sodium chloride infusion  100 mL Intravenous PRN Elmarie Shiley, MD      . acetaminophen (TYLENOL) tablet 325-650 mg  325-650 mg Oral Q4H PRN Ivan Anchors Love, PA-C      . alteplase (CATHFLO ACTIVASE) injection 2 mg  2 mg Intracatheter Once PRN  Elmarie Shiley, MD      . antiseptic oral rinse (CPC / CETYLPYRIDINIUM CHLORIDE 0.05%) solution 7 mL  7 mL Mouth Rinse BID Charlett Blake, MD   7 mL at 10/14/15 0800  . aspirin EC tablet 81 mg  81 mg Oral Daily Bary Leriche, PA-C   81 mg at 10/14/15 0859  . bisacodyl (DULCOLAX) EC tablet 5 mg  5 mg Oral Daily PRN Bary Leriche, PA-C      . diphenhydrAMINE (BENADRYL) 12.5 MG/5ML elixir 12.5-25 mg  12.5-25 mg Oral Q6H PRN Bary Leriche, PA-C      . Fluticasone Furoate-Vilanterol 200-25 MCG/INH AEPB 1 puff  1 puff Inhalation Daily Charlett Blake, MD   1 puff at 10/14/15 0843  . heparin ADULT infusion 100 units/mL (25000 units/250 mL)  1,400 Units/hr Intravenous Continuous Charlett Blake, MD 14 mL/hr at 10/14/15 1127 1,400 Units/hr at 10/14/15 1127  . heparin injection 1,000 Units  1,000 Units Dialysis PRN Elmarie Shiley, MD       . insulin aspart (novoLOG) injection 0-5 Units  0-5 Units Subcutaneous QHS Bary Leriche, PA-C   2 Units at 10/13/15 2256  . insulin aspart (novoLOG) injection 0-9 Units  0-9 Units Subcutaneous TID WC Bary Leriche, PA-C   5 Units at 10/14/15 1246  . insulin detemir (LEVEMIR) injection 20 Units  20 Units Subcutaneous QHS Charlett Blake, MD      . ipratropium-albuterol (DUONEB) 0.5-2.5 (3) MG/3ML nebulizer solution 3 mL  3 mL Nebulization Q4H PRN Artemio Aly, MD      . lidocaine (PF) (XYLOCAINE) 1 % injection 5 mL  5 mL Intradermal PRN Elmarie Shiley, MD      . lidocaine-prilocaine (EMLA) cream 1 application  1 application Topical PRN Elmarie Shiley, MD      . midodrine (PROAMATINE) tablet 5 mg  5 mg Oral TID WC Ivan Anchors Love, PA-C   5 mg at 10/14/15 1247  . montelukast (SINGULAIR) tablet 10 mg  10 mg Oral QHS Ivan Anchors Love, PA-C   10 mg at 10/13/15 2255  . pantoprazole (PROTONIX) EC tablet 40 mg  40 mg Oral BID Bary Leriche, PA-C   40 mg at 10/14/15 0859  . pentafluoroprop-tetrafluoroeth (GEBAUERS) aerosol 1 application  1 application Topical PRN Elmarie Shiley, MD      . senna-docusate (Senokot-S) tablet 1 tablet  1 tablet Oral QHS PRN Bary Leriche, PA-C      . simethicone [facility]) chewable tablet 80 mg  80 mg Oral QID PRN Bary Leriche, PA-C      . traMADol Veatrice Bourbon) tablet 50 mg  50 mg Oral Q12H PRN Bary Leriche, PA-C      . traZODone (DESYREL) tablet 25-50 mg  25-50 mg Oral QHS PRN Ivan Anchors Love, PA-C        ROS:   General:  No weight loss, Fever, chills  HEENT: No recent headaches, no nasal bleeding, no visual changes, no sore throat  Neurologic: No dizziness, blackouts, seizures. + recent symptoms of stroke or mini- stroke. + recent episodes of slurred speech, or temporary blindness.  Cardiac: No recent episodes of chest pain/pressure, no shortness of breath at rest. + shortness of breath with exertion.  Denies history of atrial fibrillation or irregular heartbeat  Vascular: No history of  rest pain in feet.  No history of claudication.  No history of non-healing ulcer, + history of DVT   Pulmonary: Home CPAP, no productive cough,  no hemoptysis,  No asthma or wheezing  Musculoskeletal:  _0  Arthritis, _1  Low back pain,  _2  Joint pain  Hematologic:? history of hypercoagulable state.  No history of easy bleeding.  No history of anemia  Gastrointestinal: No hematochezia or melena,  No gastroesophageal reflux, no trouble swallowing  Urinary: [x ] chronic Kidney disease, _3  on HD - _4  MWF or _5  TTHS, _6  Burning with urination, _7  Frequent urination, _8  Difficulty urinating;   Skin: No rashes  Psychological: No history of anxiety,  No history of depression   Physical Examination  Filed Vitals:   10/13/15 2031 10/14/15 0144 10/14/15 0648 10/14/15 0843  BP: 129/89 100/60 131/84   Pulse: 102 100 100   Temp: 98 F (36.7 C)  97.8 F (36.6 C)   TempSrc: Oral  Oral   Resp: 17  18   Height:      Weight:      SpO2: 99%  100% 100%    Body mass index is 28.79 kg/(m^2).  General:  Alert and oriented, no acute distress HEENT: Normal Neck: No JVD right side catheter Pulmonary: Clear to auscultation bilaterally Cardiac: Regular Rate and Rhythm Abdomen: Soft, non-tender, non-distended Skin: No rash Extremity Pulses:  2+ radial, brachial pulses bilaterally Musculoskeletal: No deformity or edema  Neurologic: Upper and lower extremity motor 5/5 left side, right side 0-2/5  DATA:  Vein map shows cephalic vein greater than 3 mm bilaterally wrist to upper arm   ASSESSMENT:  Pt needs long term hemodialysis access.  Will schedule outpt appt to see him again in about a month of stroke recovery.  At that time most likely schedule right radial cephalic AVF.   PLAN:  Avoid all further needlesticks BP or IVs right arm.  Please remove right hand IV.  Will arrange follow up outpt appt in 1 month  Ruta Hinds, MD [facility] [place] Office:  [phone] Pager: [phone]

## 2015-10-16 NOTE — Anesthesia Procedure Notes (Signed)
Procedure Name: LMA Insertion Date/Time: 10/16/2015 9:57 AM Performed by: Shirlyn Goltz Pre-anesthesia Checklist: Patient identified, Emergency Drugs available, Suction available and Patient being monitored Patient Re-evaluated:Patient Re-evaluated prior to inductionOxygen Delivery Method: Circle system utilized Preoxygenation: Pre-oxygenation with 100% oxygen Intubation Type: IV induction Ventilation: Mask ventilation without difficulty LMA: LMA inserted LMA Size: 5.0 Number of attempts: 1 Placement Confirmation: positive ETCO2 and breath sounds checked- equal and bilateral Tube secured with: Tape Dental Injury: Teeth and Oropharynx as per pre-operative assessment

## 2015-10-17 ENCOUNTER — Encounter (HOSPITAL_COMMUNITY): Payer: Self-pay | Admitting: Vascular Surgery

## 2015-10-17 ENCOUNTER — Inpatient Hospital Stay (HOSPITAL_COMMUNITY): Payer: BLUE CROSS/BLUE SHIELD | Admitting: Speech Pathology

## 2015-10-17 ENCOUNTER — Inpatient Hospital Stay (HOSPITAL_COMMUNITY): Payer: BLUE CROSS/BLUE SHIELD | Admitting: Physical Therapy

## 2015-10-17 ENCOUNTER — Inpatient Hospital Stay (HOSPITAL_COMMUNITY): Payer: Self-pay

## 2015-10-17 ENCOUNTER — Inpatient Hospital Stay (HOSPITAL_COMMUNITY): Payer: BLUE CROSS/BLUE SHIELD | Admitting: *Deleted

## 2015-10-17 ENCOUNTER — Inpatient Hospital Stay (HOSPITAL_COMMUNITY): Payer: BLUE CROSS/BLUE SHIELD | Admitting: Occupational Therapy

## 2015-10-17 LAB — CBC
HCT: 32.1 % — ABNORMAL LOW (ref 39.0–52.0)
Hemoglobin: 10.5 g/dL — ABNORMAL LOW (ref 13.0–17.0)
MCH: 28.2 pg (ref 26.0–34.0)
MCHC: 32.7 g/dL (ref 30.0–36.0)
MCV: 86.1 fL (ref 78.0–100.0)
Platelets: 357 10*3/uL (ref 150–400)
RBC: 3.73 MIL/uL — ABNORMAL LOW (ref 4.22–5.81)
RDW: 17.8 % — ABNORMAL HIGH (ref 11.5–15.5)
WBC: 14.1 10*3/uL — ABNORMAL HIGH (ref 4.0–10.5)

## 2015-10-17 LAB — GLUCOSE, CAPILLARY
Glucose-Capillary: 116 mg/dL — ABNORMAL HIGH (ref 65–99)
Glucose-Capillary: 231 mg/dL — ABNORMAL HIGH (ref 65–99)
Glucose-Capillary: 255 mg/dL — ABNORMAL HIGH (ref 65–99)

## 2015-10-17 LAB — HEPARIN LEVEL (UNFRACTIONATED): Heparin Unfractionated: 0.35 IU/mL (ref 0.30–0.70)

## 2015-10-17 MED ORDER — MAGIC MOUTHWASH W/LIDOCAINE
5.0000 mL | Freq: Four times a day (QID) | ORAL | Status: DC
  Administered 2015-10-17 – 2015-11-01 (×42): 5 mL via ORAL
  Filled 2015-10-17 (×45): qty 5

## 2015-10-17 NOTE — Progress Notes (Signed)
Physical Therapy Session Note  Patient Details  Name: Isaiah [name] [name] MRN: [id number] Date of Birth: 1966/07/19  Today's Date: 10/17/2015 PT Individual Time:0800  - 0900, 60 min    Short Term Goals: Week 1:  PT Short Term Goal 1 (Week 1): Pt will perform bed mobility mod A and LRAD PT Short Term Goal 2 (Week 1): Pt will perform transfers with mod A and LRAD PT Short Term Goal 3 (Week 1): Pt will initiate gait  Skilled Therapeutic Interventions/Progress Updates:  Bed mobility with VCs and min assist for rolling to use typical techniques.  Attempted stand pivot transfer +1 but pt had LOB backwards to sit on bed.  +2 stand step transfer a couple of minutes later. PT donned pt's shoes; he was unable to don shoe on intact LLe.  neuromuscular re-education via forces use, VCs, demo for bil calf raises in sitting, x 10; step taps R/L for RLe swing phase components and RLe stance stability, x 10 each. Pt required max cues to flex R hip sufficiently to clear R foot on 3" step.  W/c propulsion using hemi method. Pt has R inattention, and required max cues for continue to use L foot for steering.  PT returned pt to room and left him resting in w/;c with all needs within reach.  Quick release belt donned.    Therapy Documentation Precautions:  Precautions Precautions: Fall Precaution Comments: expressive/ receptive difficulties Restrictions Weight Bearing Restrictions: No   Vital Signs: Therapy Vitals Pulse Rate: (!) 110 (after standing activity) Patient Position (if appropriate): Sitting SpO2: 93 % O2 Device: Not Delivered Pain: Pain Assessment Pain Assessment: No/denies pain      See Function Navigator for Current Functional Status.   Therapy/Group: Individual Therapy  Trysta Showman 10/17/2015, 8:55 AM

## 2015-10-17 NOTE — Progress Notes (Signed)
Subjective/Complaints: aphasic but appears to good comprehension. Reviewed x-rays and labs On oxygen postoperatively. No evidence of shortness of breath.  ROS- cannot complete, aphasic  Objective: Vital Signs: Blood pressure 122/76, pulse 83, temperature 97.6 F (36.4 C), temperature source Oral, resp. rate 18, height 5' 11"  (1.803 m), weight 93.6 kg (206 lb 5.6 oz), SpO2 100 %. Dg Chest Port 1 View  10/16/2015  CLINICAL DATA:  Patient status post dialysis catheter insertion. EXAM: PORTABLE CHEST 1 VIEW COMPARISON:  Chest radiograph 10/15/2015 FINDINGS: Central venous catheter tip projects over the right atrium. Stable cardiomegaly. No consolidative pulmonary opacities. No pleural effusion or pneumothorax. Right shoulder joint degenerative changes. IMPRESSION: Central venous catheter tip projects over the right atrium. Cardiomegaly. No acute cardiopulmonary process. Electronically Signed   By: Lovey Newcomer M.D.   On: 10/16/2015 11:51   Dg Chest Port 1 View  10/15/2015  CLINICAL DATA:  Preoperative cardiovascular exam, IVC filter, diabetes mellitus, hypertension, asthma, stage IV chronic kidney disease EXAM: PORTABLE CHEST 1 VIEW COMPARISON:  Portable exam 1658 hours compared to 09/27/2014 FINDINGS: RIGHT jugular central venous catheter with tip projecting over RIGHT atrium. Upper normal size of cardiac silhouette. Mediastinal contours and pulmonary vascularity normal. Lungs clear. No pleural effusion or pneumothorax. Bones demineralized. IMPRESSION: No acute abnormalities. Electronically Signed   By: Lavonia Dana M.D.   On: 10/15/2015 17:13   Dg Abd Portable 1v  10/15/2015  CLINICAL DATA:  Check IVC filter placement. EXAM: PORTABLE ABDOMEN - 1 VIEW COMPARISON:  None. FINDINGS: IVC filter projects at infrarenal location at the L2 vertebral body level. Cholecystectomy clips noted. Moderate volume stool. IMPRESSION: IVC filter at L2. Electronically Signed   By: Suzy Bouchard M.D.   On: 10/15/2015 17:13    Dg Fluoro Guide Cv Line-no Report  10/16/2015  CLINICAL DATA:  FLOURO GUIDE CV LINE Fluoroscopy was utilized by the requesting physician.  No radiographic interpretation.   Results for orders placed or performed during the hospital encounter of 10/12/15 (from the past 72 hour(s))  Glucose, capillary     Status: Abnormal   Collection Time: 10/14/15  9:01 AM  Result Value Ref Range   Glucose-Capillary 240 (H) 65 - 99 mg/dL  CBC     Status: Abnormal   Collection Time: 10/14/15  9:45 AM  Result Value Ref Range   WBC 16.1 (H) 4.0 - 10.5 K/uL   RBC 3.73 (L) 4.22 - 5.81 MIL/uL   Hemoglobin 11.0 (L) 13.0 - 17.0 g/dL   HCT 32.4 (L) 39.0 - 52.0 %   MCV 86.9 78.0 - 100.0 fL   MCH 29.5 26.0 - 34.0 pg   MCHC 34.0 30.0 - 36.0 g/dL   RDW 18.4 (H) 11.5 - 15.5 %   Platelets 335 150 - 400 K/uL  Heparin level (unfractionated)     Status: None   Collection Time: 10/14/15  9:45 AM  Result Value Ref Range   Heparin Unfractionated 0.39 0.30 - 0.70 IU/mL    Comment:        IF HEPARIN RESULTS ARE BELOW EXPECTED VALUES, AND PATIENT DOSAGE HAS BEEN CONFIRMED, SUGGEST FOLLOW UP TESTING OF ANTITHROMBIN III LEVELS.   Glucose, capillary     Status: Abnormal   Collection Time: 10/14/15 11:49 AM  Result Value Ref Range   Glucose-Capillary 292 (H) 65 - 99 mg/dL  Glucose, capillary     Status: Abnormal   Collection Time: 10/14/15  4:43 PM  Result Value Ref Range   Glucose-Capillary 301 (H) 65 - 99  mg/dL  Urinalysis, Routine w reflex microscopic (not at [facility])     Status: Abnormal   Collection Time: 10/14/15  7:36 PM  Result Value Ref Range   Color, Urine YELLOW YELLOW   APPearance CLEAR CLEAR   Specific Gravity, Urine 1.011 1.005 - 1.030   pH 6.0 5.0 - 8.0   Glucose, UA 500 (A) NEGATIVE mg/dL   Hgb urine dipstick NEGATIVE NEGATIVE   Bilirubin Urine NEGATIVE NEGATIVE   Ketones, ur NEGATIVE NEGATIVE mg/dL   Protein, ur 100 (A) NEGATIVE mg/dL   Nitrite NEGATIVE NEGATIVE   Leukocytes, UA NEGATIVE  NEGATIVE  Urine microscopic-add on     Status: Abnormal   Collection Time: 10/14/15  7:36 PM  Result Value Ref Range   Squamous Epithelial / LPF 0-5 (A) NONE SEEN   WBC, UA NONE SEEN 0 - 5 WBC/hpf   RBC / HPF NONE SEEN 0 - 5 RBC/hpf   Bacteria, UA NONE SEEN NONE SEEN  Glucose, capillary     Status: Abnormal   Collection Time: 10/14/15  9:33 PM  Result Value Ref Range   Glucose-Capillary 280 (H) 65 - 99 mg/dL  Glucose, capillary     Status: Abnormal   Collection Time: 10/15/15  6:36 AM  Result Value Ref Range   Glucose-Capillary 158 (H) 65 - 99 mg/dL  CBC     Status: Abnormal   Collection Time: 10/15/15  6:45 AM  Result Value Ref Range   WBC 14.2 (H) 4.0 - 10.5 K/uL   RBC 3.51 (L) 4.22 - 5.81 MIL/uL   Hemoglobin 10.2 (L) 13.0 - 17.0 g/dL   HCT 30.1 (L) 39.0 - 52.0 %   MCV 85.8 78.0 - 100.0 fL   MCH 29.1 26.0 - 34.0 pg   MCHC 33.9 30.0 - 36.0 g/dL   RDW 18.2 (H) 11.5 - 15.5 %   Platelets 342 150 - 400 K/uL  Heparin level (unfractionated)     Status: None   Collection Time: 10/15/15  6:45 AM  Result Value Ref Range   Heparin Unfractionated 0.51 0.30 - 0.70 IU/mL    Comment:        IF HEPARIN RESULTS ARE BELOW EXPECTED VALUES, AND PATIENT DOSAGE HAS BEEN CONFIRMED, SUGGEST FOLLOW UP TESTING OF ANTITHROMBIN III LEVELS.   Glucose, capillary     Status: Abnormal   Collection Time: 10/15/15 11:26 AM  Result Value Ref Range   Glucose-Capillary 203 (H) 65 - 99 mg/dL  Glucose, capillary     Status: Abnormal   Collection Time: 10/15/15  4:51 PM  Result Value Ref Range   Glucose-Capillary 303 (H) 65 - 99 mg/dL  Surgical pcr screen     Status: None   Collection Time: 10/15/15  7:38 PM  Result Value Ref Range   MRSA, PCR NEGATIVE NEGATIVE   Staphylococcus aureus NEGATIVE NEGATIVE    Comment:        The Xpert SA Assay (FDA approved for NASAL specimens in patients over 44 years of age), is one component of a comprehensive surveillance program.  Test performance has been  validated by [name] for patients greater than or equal to 81 year old. It is not intended to diagnose infection nor to guide or monitor treatment.   Glucose, capillary     Status: Abnormal   Collection Time: 10/15/15  9:15 PM  Result Value Ref Range   Glucose-Capillary 287 (H) 65 - 99 mg/dL  Glucose, capillary     Status: Abnormal   Collection Time:  10/16/15  6:57 AM  Result Value Ref Range   Glucose-Capillary 152 (H) 65 - 99 mg/dL  I-STAT 4, (NA,K, GLUC, HGB,HCT)     Status: Abnormal   Collection Time: 10/16/15  9:16 AM  Result Value Ref Range   Sodium 134 (L) 135 - 145 mmol/L   Potassium 4.3 3.5 - 5.1 mmol/L   Glucose, Bld 168 (H) 65 - 99 mg/dL   HCT 38.0 (L) 39.0 - 52.0 %   Hemoglobin 12.9 (L) 13.0 - 17.0 g/dL  CBC     Status: Abnormal   Collection Time: 10/16/15 11:56 AM  Result Value Ref Range   WBC 13.0 (H) 4.0 - 10.5 K/uL   RBC 3.79 (L) 4.22 - 5.81 MIL/uL   Hemoglobin 10.8 (L) 13.0 - 17.0 g/dL   HCT 32.6 (L) 39.0 - 52.0 %   MCV 86.0 78.0 - 100.0 fL   MCH 28.5 26.0 - 34.0 pg   MCHC 33.1 30.0 - 36.0 g/dL   RDW 18.1 (H) 11.5 - 15.5 %   Platelets 328 150 - 400 K/uL  Basic metabolic panel     Status: Abnormal   Collection Time: 10/16/15 11:56 AM  Result Value Ref Range   Sodium 136 135 - 145 mmol/L   Potassium 5.5 (H) 3.5 - 5.1 mmol/L    Comment: SLIGHT HEMOLYSIS   Chloride 101 101 - 111 mmol/L   CO2 22 22 - 32 mmol/L   Glucose, Bld 167 (H) 65 - 99 mg/dL   BUN 38 (H) 6 - 20 mg/dL   Creatinine, Ser 5.64 (H) 0.61 - 1.24 mg/dL   Calcium 8.9 8.9 - 10.3 mg/dL   GFR calc non Af Amer 11 (L) >60 mL/min   GFR calc Af Amer 12 (L) >60 mL/min    Comment: (NOTE) The eGFR has been calculated using the CKD EPI equation. This calculation has not been validated in all clinical situations. eGFR's persistently <60 mL/min signify possible Chronic Kidney Disease.    Anion gap 13 5 - 15  Heparin level (unfractionated)     Status: Abnormal   Collection Time: 10/16/15 11:57 AM   Result Value Ref Range   Heparin Unfractionated <0.10 (L) 0.30 - 0.70 IU/mL    Comment:        IF HEPARIN RESULTS ARE BELOW EXPECTED VALUES, AND PATIENT DOSAGE HAS BEEN CONFIRMED, SUGGEST FOLLOW UP TESTING OF ANTITHROMBIN III LEVELS. REPEATED TO VERIFY   Glucose, capillary     Status: Abnormal   Collection Time: 10/16/15 12:10 PM  Result Value Ref Range   Glucose-Capillary 154 (H) 65 - 99 mg/dL   Comment 1 Notify RN   Glucose, capillary     Status: Abnormal   Collection Time: 10/16/15  1:44 PM  Result Value Ref Range   Glucose-Capillary 161 (H) 65 - 99 mg/dL  Glucose, capillary     Status: Abnormal   Collection Time: 10/16/15  9:06 PM  Result Value Ref Range   Glucose-Capillary 135 (H) 65 - 99 mg/dL  Heparin level (unfractionated)     Status: None   Collection Time: 10/16/15 10:58 PM  Result Value Ref Range   Heparin Unfractionated 0.44 0.30 - 0.70 IU/mL    Comment:        IF HEPARIN RESULTS ARE BELOW EXPECTED VALUES, AND PATIENT DOSAGE HAS BEEN CONFIRMED, SUGGEST FOLLOW UP TESTING OF ANTITHROMBIN III LEVELS.   CBC     Status: Abnormal   Collection Time: 10/17/15  6:17 AM  Result Value Ref Range  WBC 14.1 (H) 4.0 - 10.5 K/uL   RBC 3.73 (L) 4.22 - 5.81 MIL/uL   Hemoglobin 10.5 (L) 13.0 - 17.0 g/dL   HCT 32.1 (L) 39.0 - 52.0 %   MCV 86.1 78.0 - 100.0 fL   MCH 28.2 26.0 - 34.0 pg   MCHC 32.7 30.0 - 36.0 g/dL   RDW 17.8 (H) 11.5 - 15.5 %   Platelets 357 150 - 400 K/uL  Heparin level (unfractionated)     Status: None   Collection Time: 10/17/15  6:17 AM  Result Value Ref Range   Heparin Unfractionated 0.35 0.30 - 0.70 IU/mL    Comment:        IF HEPARIN RESULTS ARE BELOW EXPECTED VALUES, AND PATIENT DOSAGE HAS BEEN CONFIRMED, SUGGEST FOLLOW UP TESTING OF ANTITHROMBIN III LEVELS.   Glucose, capillary     Status: Abnormal   Collection Time: 10/17/15  6:47 AM  Result Value Ref Range   Glucose-Capillary 116 (H) 65 - 99 mg/dL      General: No acute  distress Mood and affect are appropriate Heart: Regular rate and rhythm no rubs murmurs or extra sounds Lungs: Clear to auscultation, breathing unlabored, no rales or wheezes Abdomen: Positive bowel sounds, soft nontender to palpation, nondistended Extremities: No clubbing, cyanosis, or edema Skin: No evidence of breakdown, no evidence of rash Neurologic: Cranial nerves II through XII intact, motor strength is 5/5 in left deltoid, bicep, tricep, grip, hip flexor, knee extensors, ankle dorsiflexor and plantar flexor 2 minus in the right deltoid, biceps, triceps, grip, 3 minus left hip flexor and extensor, 2 minus left ankle dorsiflexor Sensory examDifficult to assess secondary to a aphasia, does feel pinch in all 4 limbs Cerebellar exam normal finger to nose to finger as well as heel to shin in bilateral upper and lower extremities Musculoskeletal: Full range of motion in all 4 extremities. No joint swelling   Assessment/Plan: 1. 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 Physiatrist is providing close team supervision and 24 hour management of active medical problems listed below. Physiatrist and rehab team continue to assess barriers to discharge/monitor patient progress toward functional and medical goals. FIM: Function - Bathing Position: Wheelchair/chair at sink (Did LB supine and UB in wc) Body parts bathed by patient: Right arm, Chest, Abdomen, Front perineal area, Right upper leg, Left upper leg Body parts bathed by helper: Right arm, Left arm, Front perineal area, Buttocks, Back Assist Level: Touching or steadying assistance(Pt > 75%)  Function- Upper Body Dressing/Undressing What is the patient wearing?: Pull over shirt/dress Pull over shirt/dress - Perfomed by patient: Thread/unthread left sleeve, Pull shirt  over trunk Pull over shirt/dress - Perfomed by helper: Pull shirt over trunk, Thread/unthread right sleeve Assist Level: Touching or steadying assistance(Pt > 75%) Function - Lower Body Dressing/Undressing What is the patient wearing?: Non-skid slipper socks, Pants Pants- Performed by helper: Thread/unthread right pants leg, Thread/unthread left pants leg, Pull pants up/down Non-skid slipper socks- Performed by helper: Don/doff right sock, Don/doff left sock Assist for footwear: Dependant Assist for lower body dressing: Touching or steadying assistance (Pt > 75%)        Function - Chair/bed transfer Chair/bed transfer method: Stand pivot Chair/bed transfer assist level: Maximal assist (Pt 25 - 49%/lift and lower) Chair/bed transfer details: Verbal cues for sequencing, Verbal cues for technique, Verbal cues for precautions/safety, Manual facilitation for weight  shifting, Manual facilitation for placement  Function - Locomotion: Wheelchair Will patient use wheelchair at discharge?: No Type: Manual Max wheelchair distance: 25 Assist Level: Maximal assistance (Pt 25 - 49%) Wheel 50 feet with 2 turns activity did not occur: Safety/medical concerns Wheel 150 feet activity did not occur: Safety/medical concerns Turns around,maneuvers to table,bed, and toilet,negotiates 3% grade,maneuvers on rugs and over doorsills: No Function - Locomotion: Ambulation Ambulation activity did not occur: Safety/medical concerns Assistive device: Walker-rolling Max distance: 50 Assist level: 2 helpers Assist level: Moderate assist (Pt 50 - 74%) Assist level: Moderate assist (Pt 50 - 74%) Walk 150 feet activity did not occur: Safety/medical concerns Walk 10 feet on uneven surfaces activity did not occur: Safety/medical concerns  Function - Comprehension Comprehension: Auditory Comprehension assist level: Understands basic 50 - 74% of the time/ requires cueing 25 - 49% of the time  Function -  Expression Expression: Verbal Expression assist level: Expresses basic 50 - 74% of the time/requires cueing 25 - 49% of the time. Needs to repeat parts of sentences.  Function - Social Interaction Social Interaction assist level: Interacts appropriately 75 - 89% of the time - Needs redirection for appropriate language or to initiate interaction.  Function - Problem Solving Problem solving assist level: Solves basic 50 - 74% of the time/requires cueing 25 - 49% of the time  Function - Memory Memory assist level: Recognizes or recalls 50 - 74% of the time/requires cueing 25 - 49% of the time Patient normally able to recall (first 3 days only): That he or she is in a hospital, Current season  Medical Problem List and Plan: 1. Right hemiparesis and aphasia secondary to bi-cerebral embolic infarcts (L>R)- Team conference today please see physician documentation under team conference tab, met with team face-to-face to discuss problems,progress, and goals. Formulized individual treatment plan based on medical history, underlying problem and comorbidities. 2. PE/DVT/Stoke/Anticoagulation: Pharmaceutical: Lovenox bid per records from [place].  use iv heparin gtt at this time. per hematologist Dr Alen Blew , Long-term anticoagulation will be decided  3. Pain Management: Ultram prn.  4. Mood: LCSW to follow for evaluation and support.  5. Neuropsych: This patient is not fully capable of making decisions on his own behalf. 6. Skin/Wound Care: Routine pressure relief measures.  7. Fluids/Electrolytes/Nutrition: Renal restrictions. 1200 cc/FR.  8. DM type 2: Monitor BS ac/hs. Continue levemir and use SSI for elevated BS.A.m. CBG In desired range, continue current dose Levemir 9. HTN: Monitor BP bid and titrate medications as needed. Elevated at admission. Will set parameters on Midodrine.  10. ESRD on HD: per [facility]. HD tomorrow after therapies. Continue diet/volume mgt, Tunnel dialysis catheter  Placed yesterday, Heparin Resumed      11. Leukocytosis will check UA,no breathing difficulties or coughing.UA negative except for elevated protein as expected from renal failure, WBC stable 12.  Hx of GI bleed will monitor for reurrence, check Hgb in HD LOS (Days) 5 A FACE TO FACE EVALUATION WAS PERFORMED  KIRSTEINS,ANDREW E 10/17/2015, 8:45 AM

## 2015-10-17 NOTE — Progress Notes (Signed)
ANTICOAGULATION CONSULT NOTE - Follow Up Consult  Pharmacy Consult for heparin Indication: PE/DVT/stroke  Allergies  Allergen Reactions  . Codeine Other (See Comments)    Pt unknown  . Codeine Rash and Other (See Comments)    Unknown reaction (patient says it was more serious than just a rash, but he can't remember what happened)    Patient Measurements: Height: 5' 11"  (180.3 cm) Weight: 206 lb 5.6 oz (93.6 kg) (additional blankets/pillows removed) IBW/kg (Calculated) : 75.3 Heparin Dosing Weight:   Vital Signs: Temp: 97.6 F (36.4 C) (02/08 0500) Temp Source: Oral (02/08 0500) BP: 122/76 mmHg (02/08 0500) Pulse Rate: 83 (02/08 0500)  Labs:  Recent Labs  10/15/15 0645 10/16/15 0916 10/16/15 1156 10/16/15 1157 10/16/15 2258 10/17/15 0617  HGB 10.2* 12.9* 10.8*  --   --  10.5*  HCT 30.1* 38.0* 32.6*  --   --  32.1*  PLT 342  --  328  --   --  357  HEPARINUNFRC 0.51  --   --  <0.10* 0.44 0.35  CREATININE  --   --  5.64*  --   --   --     Estimated Creatinine Clearance: 18.1 mL/min (by C-G formula based on Cr of 5.64).   Medications:  Scheduled:  . antiseptic oral rinse  7 mL Mouth Rinse BID  . aspirin EC  81 mg Oral Daily  . darbepoetin (ARANESP) injection - DIALYSIS  100 mcg Intravenous Q Tue-HD  . Fluticasone Furoate-Vilanterol  1 puff Inhalation Daily  . insulin aspart  0-5 Units Subcutaneous QHS  . insulin aspart  0-9 Units Subcutaneous TID WC  . insulin detemir  20 Units Subcutaneous QHS  . midodrine  5 mg Oral TID WC  . montelukast  10 mg Oral QHS  . multivitamin  1 tablet Oral QHS  . pantoprazole  40 mg Oral BID   Infusions:  . sodium chloride 10 mL/hr at 10/16/15 0921  . heparin 1,400 Units/hr (10/16/15 2138)    Assessment: 50 yo male with PE/DVT/stroke is currently on therapeutic heparin.  Heparin level is 0.35.  Goal of Therapy:  Heparin level 0.3-0.5 units/ml Monitor platelets by anticoagulation protocol: Yes   Plan:  -Cont Heparin gtt  at 1400 units/hr  -Daily HL, CBC  Yaremi Stahlman, Tsz-Yin 10/17/2015,8:29 AM

## 2015-10-17 NOTE — Progress Notes (Signed)
Occupational Therapy Session Note  Patient Details  Name: Isaiah [name] MRN: [id number] Date of Birth: 1965-10-06  Today's Date: 10/17/2015 OT Individual Time: 1300-1415 OT Individual Time Calculation (min): 75 min    Short Term Goals: Week 1:  OT Short Term Goal 1 (Week 1): Pt will complete toilet transfer with min assist OT Short Term Goal 2 (Week 1): Pt will complete LB dressing with mod assist OT Short Term Goal 3 (Week 1): Pt will complete UB dressing with min assist OT Short Term Goal 4 (Week 1): Pt will engage in functional activity in standing for 1 minute with min asisst  Skilled Therapeutic Interventions/Progress Updates:    Pt worked on self feeding sitting EOB during first part of session.  He was able to maintain sitting balance with supervision during feeding.  Primarily pt used the LUE during session but supplied red foam grips over utensil and provided max hand over hand assist to feed with fork and spoon using the RUE.  He was able to initiate some shoulder and elbow movement but not any digit flexion greater than trace at this time.   Proceeded to transfer to the wheelchair stand pivot with mod assist and then positioned at the sink for bathing and dressing.  Mod assist for sit to stand with LB selfcare this session.  Max assist for RUE use to wash the right leg and left arm.  Mod demonstrational cueing with mod assist for donning UB and LB clothing following hemi dressing techniques.  Mod assist for crossing and maintaining left leg over right knee for donning gripper sock.  Finished session with work on Office manager from wheelchair level.  Pt used the LUE to complete task with motor planning difficulty noted.  Pt was not able to move his LUE efficiently and instead worked his head back and forth to brush his teeth.    Therapy Documentation Precautions:  Precautions Precautions: Fall Precaution Comments: expressive/ receptive difficulties Restrictions Weight Bearing  Restrictions: No  Pain: Pain Assessment Pain Assessment: No/denies pain ADL: See Function Navigator for Current Functional Status.   Therapy/Group: Individual Therapy  Gessica Jawad OTR/L 10/17/2015, 4:01 PM

## 2015-10-17 NOTE — Progress Notes (Signed)
Physical Therapy Session Note  Patient Details  Name: Isaiah [name] Isaiah [name] MRN: [id number] Date of Birth: [birth year]  Today's Date: 10/17/2015 PT Individual Time: 1100-1130 PT Individual Time Calculation (min): 30 min   Short Term Goals: Week 1:  PT Short Term Goal 1 (Week 1): Pt will perform bed mobility mod A and LRAD PT Short Term Goal 2 (Week 1): Pt will perform transfers with mod A and LRAD PT Short Term Goal 3 (Week 1): Pt will initiate gait  Skilled Therapeutic Interventions/Progress Updates:   Session focused on neuromuscular re-education, postural control, weight shifts, and activity tolerance. Patient sitting in wheelchair, reporting fatigue. Patient propelled wheelchair using BLE x 20 ft with supervision-min A and cues for sequencing and technique. Performed Kinetron using BLE from wheelchair level at 10 cm/sec for 2 trials of 3 min. Performed sit <> stand from wheelchair with min A. Standing in parallel bars, patient performed alternating hip flexion 2 trials to fatigue and heel raises x 20 with min guard overall. Patient left sitting in wheelchair with quick release belt on and call bell on 1/2 lap tray with visitor in room, handoff to SLP.     Therapy Documentation Precautions:  Precautions Precautions: Fall Precaution Comments: expressive/ receptive difficulties Restrictions Weight Bearing Restrictions: No Pain: Pain Assessment Pain Assessment: No/denies pain   See Function Navigator for Current Functional Status.   Therapy/Group: Individual Therapy  Laretta Alstrom 10/17/2015, 12:16 PM

## 2015-10-17 NOTE — Progress Notes (Signed)
Speech Language Pathology Daily Session Note  Patient Details  Name: Isaiah [name] [facility] MRN: [id number] Date of Birth: 1966/03/09  Today's Date: 10/17/2015 SLP Individual Time: 1000-1030 SLP Individual Time Calculation (min): 30 min  Short Term Goals: Week 1: SLP Short Term Goal 1 (Week 1): Pt will demonstrate effective mastication of Dys.2 textures evidenced by no more than Min assist verbal cues to monitor residue/pocketing. SLP Short Term Goal 2 (Week 1): Pt will consume current diet with small portions at a slow pace with Min verbal cues. SLP Short Term Goal 3 (Week 1): Pt will follow 2 step directions with Min verbal and visual cues.  SLP Short Term Goal 4 (Week 1): Pt will self-monitor verbal perseveration with Max assist multimodal cues. SLP Short Term Goal 5 (Week 1): Pt will name familiar objects with Mod assist semantic and phonemic cues.  SLP Short Term Goal 6 (Week 1): Pt will utilize over articulation for production of phonemes at the CV, CVC/word level for accurate productions of vowels and bilabials with Min verbal, visual cues   Skilled Therapeutic Interventions: Skilled treatment session focused on swallowing and functional communication. Upon arrival, patient appeared fatigued while sitting upright in the wheelchair. Patient had his breakfast meal in front of him but reported he did not want to eat it due to pain within his oral cavity. RN and PA made aware.  Patient consumed ice chips to moisten oral cavity with minimal anterior spillage in which he required Max A verbal cues to self-monitor and correct and delayed cough X 1. Patient demonstrated increased difficulty expressing basic wants/needs at the word and phrase level, suspect due to fatigue and not feeling well. Patient required overall Max A verbal cues for naming objects and decoding at the word level and for use of speech intelligibility strategies at the word level. Patient left upright in wheelchair with all needs within  reach. Continue with current plan of care.    Function:  Cognition Comprehension Comprehension assist level: Understands basic 50 - 74% of the time/ requires cueing 25 - 49% of the time  Expression   Expression assist level: Expresses basic 50 - 74% of the time/requires cueing 25 - 49% of the time. Needs to repeat parts of sentences.  Social Interaction Social Interaction assist level: Interacts appropriately 75 - 89% of the time - Needs redirection for appropriate language or to initiate interaction.  Problem Solving Problem solving assist level: Solves basic 50 - 74% of the time/requires cueing 25 - 49% of the time  Memory Memory assist level: Recognizes or recalls 50 - 74% of the time/requires cueing 25 - 49% of the time    Pain Pain Assessment Pain Assessment: Pain in oral cavity, unable to rate. RN and PA made aware. Patient given ice chips to moisten oral cavity.    Therapy/Group: Individual Therapy  Jeslin Bazinet 10/17/2015, 12:49 PM

## 2015-10-17 NOTE — Progress Notes (Signed)
Recreational Therapy Assessment and Plan  Patient Details  Name: Isaiah [name] MRN: [id number] Date of Birth: 10/31/1965 Today's Date: 10/17/2015  Rehab Potential: Good ELOS: 3 weeks   Assessment Problem List:  Patient Active Problem List   Diagnosis Date Noted  . Hemiparesis, aphasia, and dysphagia as late effects of cerebrovascular accident (HCC) 10/13/2015  . Gait disturbance, post-stroke 10/13/2015  . Embolic stroke (HCC) 10/12/2015  . Plasma cell disorder   . Anemia 10/31/2014  . Long term current use of anticoagulant therapy 10/31/2014  . Renal insufficiency 10/31/2014  . Iron deficiency anemia due to chronic blood loss 10/30/2014  . Encounter for therapeutic drug monitoring 10/11/2014  . Pulmonary embolus (HCC) 09/27/2014  . ESRD on dialysis (HCC) 09/27/2014  . MGUS (monoclonal gammopathy of unknown significance) 09/27/2014  . OSA on CPAP 09/27/2014  . Diabetic retinopathy of both eyes (HCC) 02/03/2012  . Sickle cell trait (HCC) 07/19/2011  . Morbid obesity (HCC) 07/19/2011  . PE (pulmonary embolism) 07/18/2011  . SOB (shortness of breath) 07/18/2011  . Hyperkalemia 07/18/2011  . ARF (acute renal failure) (HCC) 07/18/2011  . Leukocytosis, unspecified 07/18/2011  . DM (diabetes mellitus) type II controlled with renal manifestation (HCC) 07/18/2011  . Cardiac enzymes elevated 07/18/2011  . Metabolic acidosis 07/18/2011  . HTN (hypertension) 07/18/2011    Past Medical History:  Past Medical History  Diagnosis Date  . Diabetes mellitus without complication   . Sickle cell-thalassemia disease     a. Sickle cell trait.  . Pulmonary embolism 07/2011    a. Tx with Coumadin for 6 months (unknown cause per patient).  . Hyperkalemia 07/2011  . Acute renal failure 07/2011  . Monoclonal gammopathy   . CKD (chronic kidney disease)   . Sleep apnea     a. uses  CPAP.  . Hypertension   . Sickle cell-thalassemia disease   . Gout   . Pulmonary embolism 07/2011; 09/27/2014    a. Bilat PE 07/2011 - unclear cause, tx with 6 months Coumadin.;   . Monoclonal gammopathies   . Anemia   . Type II diabetes mellitus   . Asthma   . OSA on CPAP   . Sickle-cell thalassemia   . CKD (chronic kidney disease), stage IV   . History of recent blood transfusion 10/27/14    2 Units PRBC's   Past Surgical History:  Past Surgical History  Procedure Laterality Date  . Cholecystectomy    . Bone marrow biopsy    . Other surgical history      Retinal surgery  . Pars plana vitrectomy  02/17/2012    Procedure: PARS PLANA VITRECTOMY WITH 25 GAUGE; Surgeon: John D Matthews, MD; Location: [facility] OR; Service: Ophthalmology; Laterality: Right;  . Cholecystectomy  1990's?  . Eye surgery Right     Assessment & Plan Clinical Impression: Isaiah [name] is a 49 year old RH-male with history of DM type 2 with diabetic, Sickle cell-thalassemia, B-PE 11/12, OSA, CKD stage V pending dialysis, anemia who was admitted to [facility] in [place] on 09/16/15 with N/V and blood stools with acute on chronic renal failure due to acute pancreatitis. He was treated with supportive care, IV bicarb as well as multiple units PRBC and EGD with severe stage IV erosive esophagitis with gastritis. Noted to have abnormal LFTs and Hep A, Hep B and Hep C panels negative. HD initiated on 01/09 due to poor recover with decrease in UOP and currently ongoing on MWF. He developed hypoxic respiratory failure   requiring BIPAP? and was found to have RUL/RML PE as well as left popliteal DVT. He was also found to have left retrocardiac PNA and treated with IV antibiotics as well as IV heparin. Elevated troponin's felt to be due to demand ischemia due to PE and was treated with BB and ASA per cardiology  input. IVC filter was placed and post procedure was found to have right hand weakness.    CT head reportedly showed subacute to old strokes. Neurology was consulted for input and EMG showed evidence of profound neuropathy. He had worsening of symptoms with increase in right sided weakness, facial droop speech difficulty on 09/27/15. MRI on 01/21 showed Scattered foci of cortical, cerebellar and deep gray matter infarcts with mild petechial hemorrhage and developing laminar necrosis. On 01/22, he had worsening of aphasia and repeat MRI brain 1/22 showed new Restricted diffusion in left temporal and larger more prominent area in posterior left frontal/ perirolandic area involving motor strip in addition to numerous scattered infarcts in bilateral cerebral and cerebellar hemispheres. MRA without significant stenosis. Echo with positive bubble study and TEE positive for PFO. Hypercoagulation studies reported to be negative. IV heparin was changed to Lovenox due to recurrent embolic strokes on heparin. Swallow evaluation with moderate oral and mild pharyngeal dysphagia and MBS without aspiration. He was started on puree diet with thin liquids. Therapy ongoing and patient making gains. Patient transferred to CIR on 10/12/2015.      Clinical Impression: Pt presents with decreased activity tolerance, decreased functional mobility, decreased balance, aphasia, decreased visual acuity, decreased problem solving and decreased safety awareness Limiting pt's independence with leisure/community pursuits.   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) Expression Interests: Music (Comment) Other Leisure Interests: Television;Movies;Cooking/Baking Leisure Participation Style: Alone;With  Family/Friends Awareness of Community Resources: Excellent Psychosocial / Spiritual Does patient have pets?: No Social interaction - Mood/Behavior: Cooperative Engineer, drilling for Education?: Yes Recreational Therapy Orientation Orientation -Reviewed with patient: Available activity resources Strengths/Weaknesses Patient Strengths/Abilities: Willingness to participate;Active premorbidly Patient weaknesses: Physical limitations TR Patient demonstrates impairments in the following area(s): Endurance;Motor;Safety;Skin Integrity  Plan Rec Therapy Plan Is patient appropriate for Therapeutic Recreation?: Yes Rehab Potential: Good Treatment times per week: Min 1 time per week >20 minutes Estimated Length of Stay: 3 weeks TR Treatment/Interventions: Adaptive equipment instruction;1:1 session;Balance/vestibular training;Functional mobility training;Community reintegration;Cognitive remediation/compensation;Patient/family education;Therapeutic activities;Recreation/leisure participation;Therapeutic exercise;UE/LE Coordination activities;Wheelchair propulsion/positioning  Recommendations for other services: None  Discharge Criteria: Patient will be discharged from TR if patient refuses treatment 3 consecutive times without medical reason.  If treatment goals not met, if there is a change in medical status, if patient makes no progress towards goals or if patient is discharged from hospital.  The above assessment, treatment plan, treatment alternatives and goals were discussed and mutually agreed upon: by patient  [place] 10/17/2015, 11:25 AM

## 2015-10-17 NOTE — Progress Notes (Signed)
10/17/2015 4:42 PM Hemodialysis Outpatient Note; this patient has been accepted at the [facility] center on a Monday, Wednesday and Friday 2nd shift schedule. The center can begin treatment on Monday February 13 at 11:15 AM. I attempted to reach Mrs. Goya using various phone numbers listed but was unsuccessful. I was hoping to confirm with her that this schedule would work.  Thank you. Gordy Savers

## 2015-10-17 NOTE — Progress Notes (Signed)
Speech Language Pathology Daily Session Note  Patient Details  Name: Isaiah [name] [facility] MRN: [id number] Date of Birth: [birth year]  Today's Date: 10/17/2015 SLP Individual Time: 1130-1205 SLP Individual Time Calculation (min): 35 min  Short Term Goals: Week 1: SLP Short Term Goal 1 (Week 1): Pt will demonstrate effective mastication of Dys.2 textures evidenced by no more than Min assist verbal cues to monitor residue/pocketing. SLP Short Term Goal 2 (Week 1): Pt will consume current diet with small portions at a slow pace with Min verbal cues. SLP Short Term Goal 3 (Week 1): Pt will follow 2 step directions with Min verbal and visual cues.  SLP Short Term Goal 4 (Week 1): Pt will self-monitor verbal perseveration with Max assist multimodal cues. SLP Short Term Goal 5 (Week 1): Pt will name familiar objects with Mod assist semantic and phonemic cues.  SLP Short Term Goal 6 (Week 1): Pt will utilize over articulation for production of phonemes at the CV, CVC/word level for accurate productions of vowels and bilabials with Min verbal, visual cues   Skilled Therapeutic Interventions:  Pt was seen for skilled ST targeting goals for communication and dysphagia.  SLP facilitated the session with trials of dys 2 textures to continue working towards diet progression.  Pt required min verbal cues to monitor and correct anterior loss and right sided buccal residue of solids.  Trials were limited due to pt complaints of generalized oral discomfort but pt was able to clear residual solids from the oral cavity post swallow with min verbal cues for use of liquid wash.  SLP also facilitated the session with structured naming practice of familiar objects in pt's room.  Pt was 50% accurate for confrontational naming of objects, which improved to 100% accuracy with mod-max assist semantic cues.  Pt indicated at the end of today's session that he wanted to go to bed and was transferred from wheelchair to bed with assist from  nurse tech.  Pt took a phone call at the end of today's therapy session and appeared to brighten when talking with a friend though verbal expression remained limited to simple, perseverative utterances with paraphasic errors.  Pt was left in bed with call bell within reach and bed alarm set.  Continue per current plan of care.    Function:  Eating Eating   Modified Consistency Diet: Yes Eating Assist Level: Supervision or verbal cues;Set up assist for   Eating Set Up Assist For: Opening containers       Cognition Comprehension Comprehension assist level: Understands basic 50 - 74% of the time/ requires cueing 25 - 49% of the time  Expression   Expression assist level: Expresses basic 50 - 74% of the time/requires cueing 25 - 49% of the time. Needs to repeat parts of sentences.  Social Interaction Social Interaction assist level: Interacts appropriately 75 - 89% of the time - Needs redirection for appropriate language or to initiate interaction.  Problem Solving Problem solving assist level: Solves basic 50 - 74% of the time/requires cueing 25 - 49% of the time  Memory Memory assist level: Recognizes or recalls 50 - 74% of the time/requires cueing 25 - 49% of the time    Pain Pain Assessment Pain Assessment: No/denies pain  Therapy/Group: Individual Therapy  Denario Bagot, Selinda Orion 10/17/2015, 12:16 PM

## 2015-10-18 ENCOUNTER — Inpatient Hospital Stay (HOSPITAL_COMMUNITY): Payer: BLUE CROSS/BLUE SHIELD | Admitting: Speech Pathology

## 2015-10-18 ENCOUNTER — Telehealth: Payer: Self-pay | Admitting: Vascular Surgery

## 2015-10-18 ENCOUNTER — Inpatient Hospital Stay (HOSPITAL_COMMUNITY): Payer: Self-pay

## 2015-10-18 ENCOUNTER — Inpatient Hospital Stay (HOSPITAL_COMMUNITY): Payer: BLUE CROSS/BLUE SHIELD | Admitting: Occupational Therapy

## 2015-10-18 LAB — CBC
HCT: 31.4 % — ABNORMAL LOW (ref 39.0–52.0)
Hemoglobin: 10.6 g/dL — ABNORMAL LOW (ref 13.0–17.0)
MCH: 29.1 pg (ref 26.0–34.0)
MCHC: 33.8 g/dL (ref 30.0–36.0)
MCV: 86.3 fL (ref 78.0–100.0)
Platelets: 371 10*3/uL (ref 150–400)
RBC: 3.64 MIL/uL — ABNORMAL LOW (ref 4.22–5.81)
RDW: 17.8 % — ABNORMAL HIGH (ref 11.5–15.5)
WBC: 15.2 10*3/uL — ABNORMAL HIGH (ref 4.0–10.5)

## 2015-10-18 LAB — HEPARIN LEVEL (UNFRACTIONATED)
Heparin Unfractionated: 0.17 IU/mL — ABNORMAL LOW (ref 0.30–0.70)
Heparin Unfractionated: 0.25 IU/mL — ABNORMAL LOW (ref 0.30–0.70)

## 2015-10-18 LAB — GLUCOSE, CAPILLARY
Glucose-Capillary: 229 mg/dL — ABNORMAL HIGH (ref 65–99)
Glucose-Capillary: 153 mg/dL — ABNORMAL HIGH (ref 65–99)
Glucose-Capillary: 262 mg/dL — ABNORMAL HIGH (ref 65–99)
Glucose-Capillary: 142 mg/dL — ABNORMAL HIGH (ref 65–99)

## 2015-10-18 LAB — RENAL FUNCTION PANEL
Albumin: 2.8 g/dL — ABNORMAL LOW (ref 3.5–5.0)
Anion gap: 11 (ref 5–15)
BUN: 30 mg/dL — ABNORMAL HIGH (ref 6–20)
CO2: 23 mmol/L (ref 22–32)
Calcium: 9 mg/dL (ref 8.9–10.3)
Chloride: 98 mmol/L — ABNORMAL LOW (ref 101–111)
Creatinine, Ser: 5.16 mg/dL — ABNORMAL HIGH (ref 0.61–1.24)
GFR calc Af Amer: 14 mL/min — ABNORMAL LOW (ref >60)
GFR calc non Af Amer: 12 mL/min — ABNORMAL LOW (ref >60)
Glucose, Bld: 306 mg/dL — ABNORMAL HIGH (ref 65–99)
Phosphorus: 3.3 mg/dL (ref 2.5–4.6)
Potassium: 4.4 mmol/L (ref 3.5–5.1)
Sodium: 132 mmol/L — ABNORMAL LOW (ref 135–145)

## 2015-10-18 NOTE — Progress Notes (Signed)
ANTICOAGULATION CONSULT NOTE - Follow Up Consult  Pharmacy Consult for heparin Indication: PE/DVT/stroke  Allergies  Allergen Reactions  . Codeine Other (See Comments)    Pt unknown  . Codeine Rash and Other (See Comments)    Unknown reaction (patient says it was more serious than just a rash, but he can't remember what happened)    Patient Measurements: Height: 5' 11"  (180.3 cm) Weight: 222 lb (100.699 kg) IBW/kg (Calculated) : 75.3 Heparin Dosing Weight: 96 kg   Vital Signs: Temp: 97.8 F (36.6 C) (02/09 0504) Temp Source: Oral (02/09 0504) BP: 139/86 mmHg (02/09 0504) Pulse Rate: 88 (02/09 0504)  Labs:  Recent Labs  10/16/15 0916 10/16/15 1156 10/16/15 1157 10/16/15 2258 10/17/15 0617  HGB 12.9* 10.8*  --   --  10.5*  HCT 38.0* 32.6*  --   --  32.1*  PLT  --  328  --   --  357  HEPARINUNFRC  --   --  <0.10* 0.44 0.35  CREATININE  --  5.64*  --   --   --     Estimated Creatinine Clearance: 18.7 mL/min (by C-G formula based on Cr of 5.64).   Medications:  Scheduled:  . antiseptic oral rinse  7 mL Mouth Rinse BID  . aspirin EC  81 mg Oral Daily  . darbepoetin (ARANESP) injection - DIALYSIS  100 mcg Intravenous Q Tue-HD  . Fluticasone Furoate-Vilanterol  1 puff Inhalation Daily  . insulin aspart  0-5 Units Subcutaneous QHS  . insulin aspart  0-9 Units Subcutaneous TID WC  . insulin detemir  20 Units Subcutaneous QHS  . magic mouthwash w/lidocaine  5 mL Oral QID  . midodrine  5 mg Oral TID WC  . montelukast  10 mg Oral QHS  . multivitamin  1 tablet Oral QHS  . pantoprazole  40 mg Oral BID   Infusions:  . sodium chloride 10 mL/hr at 10/16/15 0921  . heparin 1,400 Units/hr (10/16/15 2138)    Assessment: 50 yo male with PE/DVT/stroke is currently on subtherapeutic heparin.  Heparin level is 0.17.  Hgb is 10.6 and Plt is 371K.  Per RN, heparin drip was held for around 15 minutes or Lenise Jr for lab to be drawn which may have affected the level.  Patient was  previously therapeutic at the current rate  Goal of Therapy:  Heparin level 0.3-0.5 units/ml Monitor platelets by anticoagulation protocol: Yes   Plan:  - continue heparin drip at 1400 units/hr for now - confirm heparin level in 8 hours  Ad Guttman, Tsz-Yin 10/18/2015,8:21 AM

## 2015-10-18 NOTE — Progress Notes (Signed)
Dialysis treatment completed.  3500 mL ultrafiltrated.  3000 mL net fluid removal.  Patient status unchanged. Lung sounds clear to ausculation in all fields. Generalized BLE edema. Cardiac: Regular R&R.  Cleansed RIJ catheter with chlorhexidine.  Disconnected lines and flushed ports with saline per protocol.  Ports locked with heparin and capped per protocol.    Report given to bedside, RN Levada Dy.

## 2015-10-18 NOTE — Progress Notes (Signed)
S: No new CO. O:BP 139/86 mmHg  Pulse 88  Temp(Src) 97.8 F (36.6 C) (Oral)  Resp 20  Ht 5' 11"  (1.803 m)  Wt 100.699 kg (222 lb)  BMI 30.98 kg/m2  SpO2 97%  Intake/Output Summary (Last 24 hours) at 10/18/15 1234 Last data filed at 10/18/15 0900  Gross per 24 hour  Intake    480 ml  Output    120 ml  Net    360 ml   Weight change: 0.098 kg (3.4 oz) KGU:RKYHC and alert CVS:RRR Resp:clear Abd:+ BS NTND Ext:No edema NEURO: Awake and alert, aphasic Rt permcath   . antiseptic oral rinse  7 mL Mouth Rinse BID  . aspirin EC  81 mg Oral Daily  . darbepoetin (ARANESP) injection - DIALYSIS  100 mcg Intravenous Q Tue-HD  . Fluticasone Furoate-Vilanterol  1 puff Inhalation Daily  . insulin aspart  0-5 Units Subcutaneous QHS  . insulin aspart  0-9 Units Subcutaneous TID WC  . insulin detemir  20 Units Subcutaneous QHS  . magic mouthwash w/lidocaine  5 mL Oral QID  . midodrine  5 mg Oral TID WC  . montelukast  10 mg Oral QHS  . multivitamin  1 tablet Oral QHS  . pantoprazole  40 mg Oral BID   No results found. BMET    Component Value Date/Time   NA 136 10/16/2015 1156   NA 138 09/06/2015 1306   K 5.5* 10/16/2015 1156   K 4.8 09/06/2015 1306   CL 101 10/16/2015 1156   CO2 22 10/16/2015 1156   CO2 15* 09/06/2015 1306   GLUCOSE 167* 10/16/2015 1156   GLUCOSE 98 09/06/2015 1306   BUN 38* 10/16/2015 1156   BUN 54.0* 09/06/2015 1306   CREATININE 5.64* 10/16/2015 1156   CREATININE 5.8* 09/06/2015 1306   CALCIUM 8.9 10/16/2015 1156   CALCIUM 7.6* 09/06/2015 1306   GFRNONAA 11* 10/16/2015 1156   GFRAA 12* 10/16/2015 1156   CBC    Component Value Date/Time   WBC 15.2* 10/18/2015 0814   WBC 17.1* 09/06/2015 1306   RBC 3.64* 10/18/2015 0814   RBC 4.26 09/06/2015 1306   RBC 3.07* 09/29/2014 1810   HGB 10.6* 10/18/2015 0814   HGB 11.3* 09/06/2015 1306   HCT 31.4* 10/18/2015 0814   HCT 32.8* 09/06/2015 1306   PLT 371 10/18/2015 0814   PLT 322 09/06/2015 1306   MCV 86.3  10/18/2015 0814   MCV 77.0* 09/06/2015 1306   MCH 29.1 10/18/2015 0814   MCH 26.5* 09/06/2015 1306   MCHC 33.8 10/18/2015 0814   MCHC 34.5 09/06/2015 1306   RDW 17.8* 10/18/2015 0814   RDW 19.1* 09/06/2015 1306   LYMPHSABS 1.6 09/06/2015 1306   LYMPHSABS 4.5* 10/04/2012 1340   MONOABS 1.1* 09/06/2015 1306   MONOABS 1.7* 10/04/2012 1340   EOSABS 0.3 09/06/2015 1306   EOSABS 0.8* 10/04/2012 1340   BASOSABS 0.1 09/06/2015 1306   BASOSABS 0.2* 10/04/2012 1340     Assessment:  1. New ESRD 2. Embolic CVA 3. Anemia  Plan: 1. HD today  Ciera Beckum T

## 2015-10-18 NOTE — Progress Notes (Signed)
Physical Therapy Session Note  Patient Details  Name: Isaiah [name] MRN: [id number] Date of Birth: 24-Aug-1966  Today's Date: 10/18/2015  Short Term Goals: Week 1:  PT Short Term Goal 1 (Week 1): Pt will perform bed mobility mod A and LRAD PT Short Term Goal 2 (Week 1): Pt will perform transfers with mod A and LRAD PT Short Term Goal 3 (Week 1): Pt will initiate gait  Skilled Therapeutic Interventions/Progress Updates:  Tx 1: PT Individual Time: 0815-0900 PT Individual Time Calculation (min): 45 min   Pt missed 15 min due to IV team/heparin.  Bed mobility to sit up then transfer to w/c to eat breakfast. Pt sat EOB x 5 min; c/o dizziness, which passed.  Sit> stand improved over yesterday, with cues to keep wt forward and not lean backward.  Pt sat in w/c to eat breakfast; neuromuscular re-education via forced use, set up to use R hand as gross grasp to hold food containers.  Pt followed swallowing precautions with mod cues for double swallow q sip or bite.  Pt left resting in w/c with all needs within reach.  Tx 2: Individual tx: 1510-1530, 20 min; make-up of missed tx time this AM  Individual: W/c> NuStep for neuromuscular re-education via forced use, manual cues, VCs for bil bil LE use to promote eccentric control x 2 min at level 4, and bil Le/bil UE use to promote alternating reciprocal movement, trunk rotation.  Use of R hand orthotic to hold hand on grip; x 5 minutes at level 4. Pt had difficulty reading numbers on NuStep even wearing reading glasses.  Tx 3:  Concurrent tx: 1535-1605, 30 min   neuromuscular re-education via demo, manual and visual cues, for trunk extension, reducing posterior pelvic tilt with bil rowing movement bil UEs, slight assistance to hold R hand on bar; long arc knee ext RLE with ankle pumps at end range.  Gait with RW x 90', x 30' with mod assist, max cues for upright trunk, R foot clearance, wt shifting, guiding RW. Pt transferred into bed to go to HD at  end of tx.    Therapy Documentation Precautions:  Precautions Precautions: Fall Precaution Comments: expressive/ receptive difficulties Restrictions Weight Bearing Restrictions: No Pain:none reported        See Function Navigator for Current Functional Status.   Therapy/Group: Individual Therapy  Terry Bolotin 10/18/2015, 11:13 AM

## 2015-10-18 NOTE — Progress Notes (Signed)
Patient arrived to unit by bed.  Reviewed treatment plan and this RN agrees with plan.  Report received from bedside RN, Levada Dy.  Consent verified.  Patient A & O X 4.   Lung sounds clear to ausculation in all fields. Generalized edema. Cardiac:  Regular R&R.  Removed caps and cleansed RIJ catheter with chlorhedxidine.  Aspirated ports of heparin and flushed them with saline per protocol.  Connected and secured lines, initiated treatment at 1623.  UF Goal of 3587m and net fluid removal 3000L.  Will continue to monitor.

## 2015-10-18 NOTE — Patient Care Conference (Signed)
Inpatient RehabilitationTeam Conference and Plan of Care Update Date: 10/17/2015   Time: 10:30 AM    Patient Name: Isaiah [name] [facility]      Medical Record Number: [id number]  Date of Birth: October 24, 1965 Sex: Male         Room/Bed: 4W05C/4W05C-01 Payor Info: Payor: BLUE CROSS BLUE SHIELD / Plan: BCBS OTHER / Product Type: *No Product type* /    Admitting Diagnosis: CVA   New ESRD Chronic kidney disease   Admit Date/Time:  10/12/2015  5:38 PM Admission Comments: No comment available   Primary Diagnosis:  Hemiparesis, aphasia, and dysphagia as late effects of cerebrovascular accident [facility]) Principal Problem: Hemiparesis, aphasia, and dysphagia as late effects of cerebrovascular accident [facility])  Patient Active Problem List   Diagnosis Date Noted  . Hemiparesis, aphasia, and dysphagia as late effects of cerebrovascular accident ([place]) 10/13/2015  . Gait disturbance, post-stroke 10/13/2015  . Embolic stroke ([place]) 29/52/8413  . Plasma cell disorder   . Anemia 10/31/2014  . Long term current use of anticoagulant therapy 10/31/2014  . Renal insufficiency 10/31/2014  . Iron deficiency anemia due to chronic blood loss 10/30/2014  . Encounter for therapeutic drug monitoring 10/11/2014  . Pulmonary embolus ([place]) 09/27/2014  . ESRD on dialysis ([place]) 09/27/2014  . MGUS (monoclonal gammopathy of unknown significance) 09/27/2014  . OSA on CPAP 09/27/2014  . Diabetic retinopathy of both eyes ([place]) 02/03/2012  . Sickle cell trait ([place]) 07/19/2011  . Morbid obesity ([place]) 07/19/2011  . PE (pulmonary embolism) 07/18/2011  . SOB (shortness of breath) 07/18/2011  . Hyperkalemia 07/18/2011  . ARF (acute renal failure) ([place]) 07/18/2011  . Leukocytosis, unspecified 07/18/2011  . DM (diabetes mellitus) type II controlled with renal manifestation ([place]) 07/18/2011  . Cardiac enzymes elevated 07/18/2011  . Metabolic acidosis 24/40/1027  . HTN (hypertension) 07/18/2011    Expected Discharge Date: Expected Discharge  Date: 11/02/15  Team Members Present: Physician leading conference: Dr. Alysia Penna Social Worker Present: Alfonse Alpers, LCSW Nurse Present: Dorien Chihuahua, RN PT Present: Georjean Mode, PT OT Present: Clyda Greener, OT SLP Present: Weston Anna, SLP PPS Coordinator present : Daiva Nakayama, RN, CRRN     Current Status/Progress Goal Weekly Team Focus  Medical   Recent dialysis onset, placement of dialysis catheter yesterday,  currently on oxygen  Home discharge with wife, initiate hemodialysis during Acute rehabilitation hospitalization  Wean off O2   Bowel/Bladder   Requiring occasional I&O caths. Oliguric. Calls for urinal at times. Continent of bowel . LBM 10/14/15  continent of bowel and bladder with mod assist   Encourage timed toileting and stool softners    Swallow/Nutrition/ Hydration   Dys. 1 textures with thin liquids, full supervision for use of compensatory strategies   supervision  carryover of strategies, trials of upgraded textures    ADL's   min assist UB bathing, mod assist dressing, max assist for LB selfcare sit to stand and for stand pivot transfers to the toilet.  RUE with some shoulder movement at Brunnstrum stage III level with stage II in the hand.  Trace digit flexion noted.    supervision to min assist level  selfcare re-training, neuromuscular re-education, balance re-training, transfer training, pt/family education   Mobility   +2 stand step transfers, min assist w/c x 100'; gait on 10/15/15 +2 x 50', min assist w/c x 100'  , max assist 4 steps  supervision transfers, w/c propulsion x 150'  and gait x 150'; min assist gait flight steps and gait in community  neuro re-ed, pt ed, transfers, w/c propulsion   Communication   Mod A  Min A  comprehension of semi-complex info, naming, verbal expression at phrase level   Safety/Cognition/ Behavioral Observations            Pain   no complaints of pain   <4  Assess and treat pain accordingly    Skin   Incision  to Right chest. Dry dressing. Clean and dry   Remain free of skiin breakdown and infection   Assess skin q shift. Encourage pressure relief.     Rehab Goals Patient on target to meet rehab goals: Yes Rehab Goals Revised: none, as this is pt's first conference and goals were just established *See Care Plan and progress notes for long and short-term goals.  Barriers to Discharge: Still requires physical assistance, has a aphasia, deconditioning due to multiple medical issues    Possible Resolutions to Barriers:  Continue rehabilitation program    Discharge Planning/Teaching Needs:  Pt will return to his home with his wife and mother to provide 24/7 supervision until pt can be safely left alone.  Pt's mother is here often, but she and wife can come in for family education closer to d/c.   Team Discussion:  Pt was originally admitted to hospital in [place] with pancreatitis and then developed a DVT and pneumonia.  Started on heparin and IV antibiotics.  Pt has dialysis cath now and he will dialyze on Monday, Wednesday, and Friday.  Pt has some upper extremity movement and MD expects he will get more lower extremity movement.  Pt was mod assist with RW at 50'.  PT goals are supervision with min assist for stairs.  OT stated pt is at mod assist with sit to stand transfers and upper body tasks and max assist for lower body tasks.  Goals are for supervision.  Pt was exhausted with ST and had mouth pain and didn't eat his breakfast; pt having an off day.  Mod assist currently for making basic needs known.  Revisions to Treatment Plan:  none   Continued Need for Acute Rehabilitation Level of Care: The patient requires daily medical management by a physician with specialized training in physical medicine and rehabilitation for the following conditions: Daily direction of a multidisciplinary physical rehabilitation program to ensure safe treatment while eliciting the highest outcome that is of practical value  to the patient.: Yes Daily medical management of patient stability for increased activity during participation in an intensive rehabilitation regime.: Yes Daily analysis of laboratory values and/or radiology reports with any subsequent need for medication adjustment of medical intervention for : Renal problems;Neurological problems;Post surgical problems  Vergia Chea, Silvestre Mesi 10/18/2015, 1:18 PM

## 2015-10-18 NOTE — Plan of Care (Signed)
Problem: RH KNOWLEDGE DEFICIT Goal: RH STG INCREASE KNOWLEDGE OF DIABETES Outcome: Progressing Patient able to nod head of simple understanding to diabetes management

## 2015-10-18 NOTE — Consult Note (Signed)
   [name] Inpatient Consult   10/18/2015  [place] 08-16-1966 [id number]  Came back to room to speak with patient's mother. Mr. Gautreau is in HD. Spoke with patient's mother who states writer should call wife at [phone]. Attempted to contact Mrs. Turbin. She was unable to talk at the time. Will try back at another time.   Marthenia Rolling, MSN-Ed, RN,BSN [facility] Liaison [phone]

## 2015-10-18 NOTE — Progress Notes (Signed)
Occupational Therapy Session Note  Patient Details  Name: Isaiah [name] MRN: [id number] Date of Birth: 19-Jul-1966  Today's Date: 10/18/2015 OT Individual Time: 0930-1030 OT Individual Time Calculation (min): 60 min    Short Term Goals: Week 1:  OT Short Term Goal 1 (Week 1): Pt will complete toilet transfer with min assist OT Short Term Goal 2 (Week 1): Pt will complete LB dressing with mod assist OT Short Term Goal 3 (Week 1): Pt will complete UB dressing with min assist OT Short Term Goal 4 (Week 1): Pt will engage in functional activity in standing for 1 minute with min asisst  Skilled Therapeutic Interventions/Progress Updates:  Upon entering the room, pt seated in wheelchair with no s/o,signs, or symptoms of pain. Pt with requesting UB bathing and dressing this session. He reports LB completed prior to therapist arrival. Skilled OT intervention with focus on self care retraining,safety, and R UE NMR. Pt completing bathing tasks with mod instructional cues. Pt performing most of task with L UE but placing cloth on R hand and with hand over hand assist from therapist he washed L arm pit and L arm. Pt showing good safety awareness but also knowing where R UE is during session and either placing in lap or on sink during functional tasks. Pt placing items in R hand to hold with assist from therapist while utilizing L hand to open containers for grooming task. Pt performed 10 reps of AROM elbow extension/flexion with therapist assisting to keep UE close to body to decrease compensatory movements. Pt performing self ROM for wrist and digits with demonstration and cues for proper technique. Pt remained seated in wheelchair at end of session with call bell within reach and R UE on arm tray.   Therapy Documentation Precautions:  Precautions Precautions: Fall Precaution Comments: expressive/ receptive difficulties Restrictions Weight Bearing Restrictions: No General:  See Function Navigator for  Current Functional Status.   Therapy/Group: Individual Therapy  Phineas Semen 10/18/2015, 10:43 AM

## 2015-10-18 NOTE — Progress Notes (Signed)
Social Work Patient ID: Isaiah [name], male   DOB: [birth year], 50 y.o.   MRN: [id number]   CSW met with pt and his mother to update them on team conference discussion.  Then, CSW spoke with pt's wife via telephone to update her, as well.  She was pleased to know of d/c date and understands that pt will need to have 24/7 supervision initially.  CSW asked MD/PA to complete wife's FMLA paperwork and pt's loan deferment paperwork and MD note sent to pt's HR department at [facility] [employer] via fax.  Wife was appreciative.  Copies of letter placed in pt's room for the pt's wife.  CSW will continue to follow and assist as needed.

## 2015-10-18 NOTE — Consult Note (Addendum)
   [facility] Inpatient Consult   10/18/2015  [place] 1965/11/03 [id number]   Came by to speak with patient and wife. However, no one in room at this time. [facility] Care Management/Link to Wellness courtesy visit to follow up with patient's wife [employer] employee) who has been in contact with [place] [employer] office in recent past. Will attempt to reach wife by phone.   Marthenia Rolling, MSN-Ed, RN,BSN [facility] Liaison [phone]

## 2015-10-18 NOTE — Telephone Encounter (Signed)
LM for pt re appt, dpm

## 2015-10-18 NOTE — Progress Notes (Signed)
ANTICOAGULATION CONSULT NOTE - Follow Up Consult  Pharmacy Consult for heparin Indication: PE/DVT/stroke  Allergies  Allergen Reactions  . Codeine Other (See Comments)    Pt unknown  . Codeine Rash and Other (See Comments)    Unknown reaction (patient says it was more serious than just a rash, but he can't remember what happened)    Patient Measurements: Height: 5' 11"  (180.3 cm) Weight: 202 lb 6.1 oz (91.8 kg) IBW/kg (Calculated) : 75.3 Heparin Dosing Weight: 96 kg   Vital Signs: Temp: 97.9 F (36.6 C) (02/09 1616) Temp Source: Oral (02/09 1453) BP: 112/84 mmHg (02/09 1830) Pulse Rate: 95 (02/09 1830)  Labs:  Recent Labs  10/16/15 1156  10/17/15 0617 10/18/15 0814 10/18/15 1620 10/18/15 1746  HGB 10.8*  --  10.5* 10.6*  --   --   HCT 32.6*  --  32.1* 31.4*  --   --   PLT 328  --  357 371  --   --   HEPARINUNFRC  --   < > 0.35 0.17*  --  0.25*  CREATININE 5.64*  --   --   --  5.16*  --   < > = values in this interval not displayed.  Estimated Creatinine Clearance: 19.6 mL/min (by C-G formula based on Cr of 5.16).   Medications:  Scheduled:  . antiseptic oral rinse  7 mL Mouth Rinse BID  . aspirin EC  81 mg Oral Daily  . darbepoetin (ARANESP) injection - DIALYSIS  100 mcg Intravenous Q Tue-HD  . Fluticasone Furoate-Vilanterol  1 puff Inhalation Daily  . insulin aspart  0-5 Units Subcutaneous QHS  . insulin aspart  0-9 Units Subcutaneous TID WC  . insulin detemir  20 Units Subcutaneous QHS  . magic mouthwash w/lidocaine  5 mL Oral QID  . midodrine  5 mg Oral TID WC  . montelukast  10 mg Oral QHS  . multivitamin  1 tablet Oral QHS  . pantoprazole  40 mg Oral BID   Infusions:  . sodium chloride 10 mL/hr at 10/16/15 0921  . heparin 1,400 Units/hr (10/18/15 1039)    Assessment: 50 yo male with PE/DVT/stroke is currently on IV heparin.  Current heparin level 0.25, however level was drawn while pt was actively dialyzing.  Goal heparin level 0.3-0.5.  Goal  of Therapy:  Heparin level 0.3-0.5 units/ml Monitor platelets by anticoagulation protocol: Yes   Plan:  - continue heparin drip at 1400 units/hr for now - confirm heparin level ~ 4 hrs after end of HD.  Uvaldo Rising, BCPS  Clinical Pharmacist Pager [phone]  10/18/2015 6:58 PM

## 2015-10-18 NOTE — Progress Notes (Signed)
Speech Language Pathology Daily Session Note  Patient Details  Name: Isaiah [name] [facility] MRN: [id number] Date of Birth: 31-Dec-1965  Today's Date: 10/18/2015 SLP Individual Time: 1330-1430 SLP Individual Time Calculation (min): 60 min  Short Term Goals: Week 1: SLP Short Term Goal 1 (Week 1): Pt will demonstrate effective mastication of Dys.2 textures evidenced by no more than Min assist verbal cues to monitor residue/pocketing. SLP Short Term Goal 2 (Week 1): Pt will consume current diet with small portions at a slow pace with Min verbal cues. SLP Short Term Goal 3 (Week 1): Pt will follow 2 step directions with Min verbal and visual cues.  SLP Short Term Goal 4 (Week 1): Pt will self-monitor verbal perseveration with Max assist multimodal cues. SLP Short Term Goal 5 (Week 1): Pt will name familiar objects with Mod assist semantic and phonemic cues.  SLP Short Term Goal 6 (Week 1): Pt will utilize over articulation for production of phonemes at the CV, CVC/word level for accurate productions of vowels and bilabials with Min verbal, visual cues   Skilled Therapeutic Interventions: Skilled treatment session focused on functional communication. SLP facilitated session by providing Min-Mod A phonemic and semantic for naming of functional items with 90% accuracy. Patient identified functional items from a field of 2 by their function with 100% accuracy and required Mod A verbal and semantic cues to generate a phrase while naming the object. Patient also required Max A verbal cues for use of over-articulation at the word level to maximize intelligibility to 80%. Patient left upright in wheelchair with all needs within reach and family present. Continue with current plan of care.    Function:  Cognition Comprehension Comprehension assist level: Understands basic 50 - 74% of the time/ requires cueing 25 - 49% of the time  Expression   Expression assist level: Expresses basic 50 - 74% of the time/requires  cueing 25 - 49% of the time. Needs to repeat parts of sentences.  Social Interaction Social Interaction assist level: Interacts appropriately 75 - 89% of the time - Needs redirection for appropriate language or to initiate interaction.  Problem Solving Problem solving assist level: Solves basic 50 - 74% of the time/requires cueing 25 - 49% of the time  Memory Memory assist level: Recognizes or recalls 50 - 74% of the time/requires cueing 25 - 49% of the time    Pain No/Denies Pain   Therapy/Group: Individual Therapy  Donnella Morford 10/18/2015, 4:07 PM

## 2015-10-18 NOTE — Progress Notes (Signed)
Subjective/Complaints: No issues overnight No pains, transfers with mod A ROS- cannot complete, aphasic  Objective: Vital Signs: Blood pressure 139/86, pulse 88, temperature 97.8 F (36.6 C), temperature source Oral, resp. rate 20, height 5' 11"  (1.803 m), weight 100.699 kg (222 lb), SpO2 97 %. Dg Chest Port 1 View  10/16/2015  CLINICAL DATA:  Patient status post dialysis catheter insertion. EXAM: PORTABLE CHEST 1 VIEW COMPARISON:  Chest radiograph 10/15/2015 FINDINGS: Central venous catheter tip projects over the right atrium. Stable cardiomegaly. No consolidative pulmonary opacities. No pleural effusion or pneumothorax. Right shoulder joint degenerative changes. IMPRESSION: Central venous catheter tip projects over the right atrium. Cardiomegaly. No acute cardiopulmonary process. Electronically Signed   By: Lovey Newcomer M.D.   On: 10/16/2015 11:51   Dg Fluoro Guide Cv Line-no Report  10/16/2015  CLINICAL DATA:  FLOURO GUIDE CV LINE Fluoroscopy was utilized by the requesting physician.  No radiographic interpretation.   Results for orders placed or performed during the hospital encounter of 10/12/15 (from the past 72 hour(s))  Glucose, capillary     Status: Abnormal   Collection Time: 10/15/15 11:26 AM  Result Value Ref Range   Glucose-Capillary 203 (H) 65 - 99 mg/dL  Glucose, capillary     Status: Abnormal   Collection Time: 10/15/15  4:51 PM  Result Value Ref Range   Glucose-Capillary 303 (H) 65 - 99 mg/dL  Surgical pcr screen     Status: None   Collection Time: 10/15/15  7:38 PM  Result Value Ref Range   MRSA, PCR NEGATIVE NEGATIVE   Staphylococcus aureus NEGATIVE NEGATIVE    Comment:        The Xpert SA Assay (FDA approved for NASAL specimens in patients over 85 years of age), is one component of a comprehensive surveillance program.  Test performance has been validated by [name] for patients greater than or equal to 86 year old. It is not intended to diagnose infection  nor to guide or monitor treatment.   Glucose, capillary     Status: Abnormal   Collection Time: 10/15/15  9:15 PM  Result Value Ref Range   Glucose-Capillary 287 (H) 65 - 99 mg/dL  Glucose, capillary     Status: Abnormal   Collection Time: 10/16/15  6:57 AM  Result Value Ref Range   Glucose-Capillary 152 (H) 65 - 99 mg/dL  I-STAT 4, (NA,K, GLUC, HGB,HCT)     Status: Abnormal   Collection Time: 10/16/15  9:16 AM  Result Value Ref Range   Sodium 134 (L) 135 - 145 mmol/L   Potassium 4.3 3.5 - 5.1 mmol/L   Glucose, Bld 168 (H) 65 - 99 mg/dL   HCT 38.0 (L) 39.0 - 52.0 %   Hemoglobin 12.9 (L) 13.0 - 17.0 g/dL  CBC     Status: Abnormal   Collection Time: 10/16/15 11:56 AM  Result Value Ref Range   WBC 13.0 (H) 4.0 - 10.5 K/uL   RBC 3.79 (L) 4.22 - 5.81 MIL/uL   Hemoglobin 10.8 (L) 13.0 - 17.0 g/dL   HCT 32.6 (L) 39.0 - 52.0 %   MCV 86.0 78.0 - 100.0 fL   MCH 28.5 26.0 - 34.0 pg   MCHC 33.1 30.0 - 36.0 g/dL   RDW 18.1 (H) 11.5 - 15.5 %   Platelets 328 150 - 400 K/uL  Basic metabolic panel     Status: Abnormal   Collection Time: 10/16/15 11:56 AM  Result Value Ref Range   Sodium 136 135 - 145  mmol/L   Potassium 5.5 (H) 3.5 - 5.1 mmol/L    Comment: SLIGHT HEMOLYSIS   Chloride 101 101 - 111 mmol/L   CO2 22 22 - 32 mmol/L   Glucose, Bld 167 (H) 65 - 99 mg/dL   BUN 38 (H) 6 - 20 mg/dL   Creatinine, Ser 5.64 (H) 0.61 - 1.24 mg/dL   Calcium 8.9 8.9 - 10.3 mg/dL   GFR calc non Af Amer 11 (L) >60 mL/min   GFR calc Af Amer 12 (L) >60 mL/min    Comment: (NOTE) The eGFR has been calculated using the CKD EPI equation. This calculation has not been validated in all clinical situations. eGFR's persistently <60 mL/min signify possible Chronic Kidney Disease.    Anion gap 13 5 - 15  Heparin level (unfractionated)     Status: Abnormal   Collection Time: 10/16/15 11:57 AM  Result Value Ref Range   Heparin Unfractionated <0.10 (L) 0.30 - 0.70 IU/mL    Comment:        IF HEPARIN RESULTS  ARE BELOW EXPECTED VALUES, AND PATIENT DOSAGE HAS BEEN CONFIRMED, SUGGEST FOLLOW UP TESTING OF ANTITHROMBIN III LEVELS. REPEATED TO VERIFY   Glucose, capillary     Status: Abnormal   Collection Time: 10/16/15 12:10 PM  Result Value Ref Range   Glucose-Capillary 154 (H) 65 - 99 mg/dL   Comment 1 Notify RN   Glucose, capillary     Status: Abnormal   Collection Time: 10/16/15  1:44 PM  Result Value Ref Range   Glucose-Capillary 161 (H) 65 - 99 mg/dL  Glucose, capillary     Status: Abnormal   Collection Time: 10/16/15  9:06 PM  Result Value Ref Range   Glucose-Capillary 135 (H) 65 - 99 mg/dL  Heparin level (unfractionated)     Status: None   Collection Time: 10/16/15 10:58 PM  Result Value Ref Range   Heparin Unfractionated 0.44 0.30 - 0.70 IU/mL    Comment:        IF HEPARIN RESULTS ARE BELOW EXPECTED VALUES, AND PATIENT DOSAGE HAS BEEN CONFIRMED, SUGGEST FOLLOW UP TESTING OF ANTITHROMBIN III LEVELS.   CBC     Status: Abnormal   Collection Time: 10/17/15  6:17 AM  Result Value Ref Range   WBC 14.1 (H) 4.0 - 10.5 K/uL   RBC 3.73 (L) 4.22 - 5.81 MIL/uL   Hemoglobin 10.5 (L) 13.0 - 17.0 g/dL   HCT 32.1 (L) 39.0 - 52.0 %   MCV 86.1 78.0 - 100.0 fL   MCH 28.2 26.0 - 34.0 pg   MCHC 32.7 30.0 - 36.0 g/dL   RDW 17.8 (H) 11.5 - 15.5 %   Platelets 357 150 - 400 K/uL  Heparin level (unfractionated)     Status: None   Collection Time: 10/17/15  6:17 AM  Result Value Ref Range   Heparin Unfractionated 0.35 0.30 - 0.70 IU/mL    Comment:        IF HEPARIN RESULTS ARE BELOW EXPECTED VALUES, AND PATIENT DOSAGE HAS BEEN CONFIRMED, SUGGEST FOLLOW UP TESTING OF ANTITHROMBIN III LEVELS.   Glucose, capillary     Status: Abnormal   Collection Time: 10/17/15  6:47 AM  Result Value Ref Range   Glucose-Capillary 116 (H) 65 - 99 mg/dL  Glucose, capillary     Status: Abnormal   Collection Time: 10/17/15 12:11 PM  Result Value Ref Range   Glucose-Capillary 231 (H) 65 - 99 mg/dL    Comment 1 Notify RN   Glucose, capillary  Status: Abnormal   Collection Time: 10/17/15  4:22 PM  Result Value Ref Range   Glucose-Capillary 229 (H) 65 - 99 mg/dL   Comment 1 Notify RN   Glucose, capillary     Status: Abnormal   Collection Time: 10/17/15  8:44 PM  Result Value Ref Range   Glucose-Capillary 255 (H) 65 - 99 mg/dL      General: No acute distress Mood and affect are appropriate Heart: Regular rate and rhythm no rubs murmurs or extra sounds Lungs: Clear to auscultation, breathing unlabored, no rales or wheezes Abdomen: Positive bowel sounds, soft nontender to palpation, nondistended Extremities: No clubbing, cyanosis, or edema Skin: No evidence of breakdown, no evidence of rash Neurologic: Cranial nerves II through XII intact, motor strength is 5/5 in left deltoid, bicep, tricep, grip, hip flexor, knee extensors, ankle dorsiflexor and plantar flexor 2 minus in the right deltoid, biceps, triceps, grip, 3 minus left hip flexor and extensor, 2 minus left ankle dorsiflexor Sensory examDifficult to assess secondary to a aphasia, does feel pinch in all 4 limbs Cerebellar exam normal finger to nose to finger as well as heel to shin in bilateral upper and lower extremities Musculoskeletal: Full range of motion in all 4 extremities. No joint swelling   Assessment/Plan: 1. 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 Physiatrist is providing close team supervision and 24 hour management of active medical problems listed below. Physiatrist and rehab team continue to assess barriers to discharge/monitor patient progress toward functional and medical goals. FIM: Function - Bathing Position: Wheelchair/chair at sink Body parts bathed by patient: Right arm, Chest, Abdomen, Front perineal area, Right upper leg, Left  upper leg Body parts bathed by helper: Right lower leg, Left lower leg, Buttocks, Left arm, Back Assist Level: Touching or steadying assistance(Pt > 75%)  Function- Upper Body Dressing/Undressing What is the patient wearing?: Pull over shirt/dress Pull over shirt/dress - Perfomed by patient: Put head through opening, Pull shirt over trunk Pull over shirt/dress - Perfomed by helper: Thread/unthread left sleeve, Thread/unthread right sleeve Assist Level: Touching or steadying assistance(Pt > 75%) Function - Lower Body Dressing/Undressing What is the patient wearing?: Pants, Non-skid slipper socks Position: Wheelchair/chair at sink Pants- Performed by patient: Thread/unthread left pants leg Pants- Performed by helper: Thread/unthread right pants leg, Pull pants up/down Non-skid slipper socks- Performed by helper: Don/doff right sock, Don/doff left sock Assist for footwear: Dependant Assist for lower body dressing: Touching or steadying assistance (Pt > 75%)        Function - Chair/bed transfer Chair/bed transfer method: Squat pivot Chair/bed transfer assist level: 2 helpers Chair/bed transfer details: Verbal cues for sequencing, Verbal cues for technique, Verbal cues for precautions/safety, Manual facilitation for weight shifting, Manual facilitation for placement  Function - Locomotion: Wheelchair Will patient use wheelchair at discharge?: No Type: Manual Max wheelchair distance: 20 Assist Level: Touching or steadying assistance (Pt > 75%) Wheel 50 feet with 2 turns activity did not occur: Safety/medical concerns Assist Level: Moderate assistance (Pt 50 - 74%) Wheel 150 feet activity did not occur: Safety/medical concerns Turns around,maneuvers to table,bed, and toilet,negotiates 3% grade,maneuvers on rugs and over doorsills: No Function - Locomotion: Ambulation Ambulation activity did not occur: Safety/medical concerns Assistive device: Walker-rolling Max distance: 50 Assist  level: 2 helpers Assist level: Moderate assist (Pt 50 - 74%) Assist level: Moderate assist (Pt 50 - 74%) Walk  150 feet activity did not occur: Safety/medical concerns Walk 10 feet on uneven surfaces activity did not occur: Safety/medical concerns  Function - Comprehension Comprehension: Auditory Comprehension assist level: Understands basic 50 - 74% of the time/ requires cueing 25 - 49% of the time  Function - Expression Expression: Verbal Expression assist level: Expresses basic 50 - 74% of the time/requires cueing 25 - 49% of the time. Needs to repeat parts of sentences.  Function - Social Interaction Social Interaction assist level: Interacts appropriately 75 - 89% of the time - Needs redirection for appropriate language or to initiate interaction.  Function - Problem Solving Problem solving assist level: Solves basic 50 - 74% of the time/requires cueing 25 - 49% of the time  Function - Memory Memory assist level: Recognizes or recalls 50 - 74% of the time/requires cueing 25 - 49% of the time Patient normally able to recall (first 3 days only): That he or she is in a hospital, Current season  Medical Problem List and Plan: 1. Right hemiparesis and aphasia secondary to bi-cerebral embolic infarcts (L>R)- ask neuro eval once imaging from [place] available  2. PE/DVT/Stoke/Anticoagulation: Pharmaceutical: Lovenox bid per records from [place].  use iv heparin gtt at this time. per hematologist Dr Alen Blew , ?warfarin 3. Pain Management: Ultram prn.  4. Mood: LCSW to follow for evaluation and support.  5. Neuropsych: This patient is not fully capable of making decisions on his own behalf. 6. Skin/Wound Care: Routine pressure relief measures.  7. Fluids/Electrolytes/Nutrition: Renal restrictions. 1200 cc/FR.  8. DM type 2: Monitor BS ac/hs. Continue levemir and use SSI for elevated BS.A.m. CBG In desired range, continue current dose Levemir 9. HTN: Monitor BP bid and titrate  medications as needed. Elevated at admission. Will set parameters on Midodrine.  10. ESRD on HD: per [facility]. HD tomorrow after therapies. Continue diet/volume mgt, Tunnel dialysis catheter Placed       11. Leukocytosis will check UA,no breathing difficulties or coughing.UA negative except for elevated protein as expected from renal failure, WBC stable 12.  Hx of GI bleed will monitor for reurrence,Hgb stable at 10.5g LOS (Days) 6 A FACE TO FACE EVALUATION WAS PERFORMED  KIRSTEINS,ANDREW E 10/18/2015, 8:35 AM

## 2015-10-18 NOTE — Telephone Encounter (Signed)
-----   Message from Mena Goes, RN sent at 10/15/2015 10:33 AM EST ----- Regarding: schedule   ----- Message -----    From: Elam Dutch, MD    Sent: 10/14/2015   2:06 PM      To: [facility] Charge Pool  Pt needs appt with me in 1 month with right arm vein map at that time to discuss placing fistula  Level 5 consult Dr Elmarie Shiley requesting  Ruta Hinds

## 2015-10-19 ENCOUNTER — Inpatient Hospital Stay (HOSPITAL_COMMUNITY): Payer: BLUE CROSS/BLUE SHIELD | Admitting: Speech Pathology

## 2015-10-19 ENCOUNTER — Inpatient Hospital Stay (HOSPITAL_COMMUNITY): Payer: BLUE CROSS/BLUE SHIELD | Admitting: Occupational Therapy

## 2015-10-19 ENCOUNTER — Inpatient Hospital Stay (HOSPITAL_COMMUNITY): Payer: Self-pay | Admitting: Physical Therapy

## 2015-10-19 LAB — GLUCOSE, CAPILLARY
Glucose-Capillary: 108 mg/dL — ABNORMAL HIGH (ref 65–99)
Glucose-Capillary: 254 mg/dL — ABNORMAL HIGH (ref 65–99)
Glucose-Capillary: 138 mg/dL — ABNORMAL HIGH (ref 65–99)
Glucose-Capillary: 205 mg/dL — ABNORMAL HIGH (ref 65–99)

## 2015-10-19 LAB — HEPARIN LEVEL (UNFRACTIONATED)
Heparin Unfractionated: 0.1 IU/mL — ABNORMAL LOW (ref 0.30–0.70)
Heparin Unfractionated: 0.21 IU/mL — ABNORMAL LOW (ref 0.30–0.70)
Heparin Unfractionated: 0.63 IU/mL (ref 0.30–0.70)

## 2015-10-19 LAB — CBC
HCT: 33.8 % — ABNORMAL LOW (ref 39.0–52.0)
Hemoglobin: 11.1 g/dL — ABNORMAL LOW (ref 13.0–17.0)
MCH: 28.2 pg (ref 26.0–34.0)
MCHC: 32.8 g/dL (ref 30.0–36.0)
MCV: 86 fL (ref 78.0–100.0)
Platelets: 397 10*3/uL (ref 150–400)
RBC: 3.93 MIL/uL — ABNORMAL LOW (ref 4.22–5.81)
RDW: 17.6 % — ABNORMAL HIGH (ref 11.5–15.5)
WBC: 15.4 10*3/uL — ABNORMAL HIGH (ref 4.0–10.5)

## 2015-10-19 NOTE — Progress Notes (Signed)
Speech Language Pathology Weekly Progress and Session Note  Patient Details  Name: Isaiah [name] [facility] MRN: [id number] Date of Birth: 1965-12-18  Beginning of progress report period: October 12, 2015   End of progress report period: October 19, 2015  Today's Date: 10/19/2015 SLP Individual Time: 0801-0900 SLP Individual Time Calculation (min): 59 min  Short Term Goals: Week 1: SLP Short Term Goal 1 (Week 1): Pt will demonstrate effective mastication of Dys.2 textures evidenced by no more than Min assist verbal cues to monitor residue/pocketing. SLP Short Term Goal 1 - Progress (Week 1): Met SLP Short Term Goal 2 (Week 1): Pt will consume current diet with small portions at a slow pace with Min verbal cues. SLP Short Term Goal 2 - Progress (Week 1): Met SLP Short Term Goal 3 (Week 1): Pt will follow 2 step directions with Min verbal and visual cues.  SLP Short Term Goal 3 - Progress (Week 1): Met SLP Short Term Goal 4 (Week 1): Pt will self-monitor verbal perseveration with Max assist multimodal cues. SLP Short Term Goal 4 - Progress (Week 1): Met SLP Short Term Goal 5 (Week 1): Pt will name familiar objects with Mod assist semantic and phonemic cues.  SLP Short Term Goal 5 - Progress (Week 1): Met SLP Short Term Goal 6 (Week 1): Pt will utilize over articulation for production of phonemes at the CV, CVC/word level for accurate productions of vowels and bilabials with Min verbal, visual cues  SLP Short Term Goal 6 - Progress (Week 1): Progressing toward goal    New Short Term Goals: Week 2: SLP Short Term Goal 1 (Week 2): Pt will consume dys 2 textures and thin liquids with minimal overt s/s of aspiration and supervision for use of swallowing precautions over 2 targeted sessions prior to advancement.   SLP Short Term Goal 2 (Week 2): Pt will self-monitor and correct verbal perseveration with mod assist multimodal cues. SLP Short Term Goal 3 (Week 2): Pt will name familiar objects with min  assist semantic cues.  SLP Short Term Goal 4 (Week 2): Pt will utilize over articulation for production of phonemes at the CV, CVC/word level for accurate production of vowels and bilabials with Min verbal, visual cues  SLP Short Term Goal 5 (Week 2): Pt will verbalize at the phrase level with mod assist verbal cues for 75% accuracy  Weekly Progress Updates:  Pt made functional gains this reporting period and has met 5 out of 6 short term goals.  Pt has demonstrated improvement in mastication of solids during trials of dys 2 textures and in use of swallowing precautions with presentations of his currently prescribed diet. Recommend observation with dys 2 textures over the next 1-2 sessions prior to diet advancement.  Pt has also demonstrated improved verbal expression and is overall mod-max assist for functional communication of basic needs and wants.  Pt can follow 2 step commands with min assist-supervision level cues.  Pt would continue to benefit from ST follow up while inpatient in order to maximize functional independence for dysphagia and communication prior to discharge.  Continue to anticipate that pt will need ST follow up at next level of care in addition to 24/7 supervision.     Intensity: Minumum of 1-2 x/day, 30 to 90 minutes Frequency: 3 to 5 out of 7 days Duration/Length of Stay: 17-21 days Treatment/Interventions: Cognitive remediation/compensation;Cueing hierarchy;Dysphagia/aspiration precaution training;Environmental controls;Functional tasks;Internal/external aids;Patient/family education;Speech/Language facilitation;Therapeutic Exercise   Daily Session  Skilled Therapeutic Interventions: Pt was seen for  skilled ST targeting goals for dysphagia and communication.  Pt consuming breakfast upon arrival and was utilizing swallowing precautions with overall supervision level assist with no overt s/s of aspiration evident with solids or liquids.  SLP facilitated the session with trials  of dys 2 textures to continue working towards diet progression and pt demonstrated effective mastication of solids with minimal residuals left in the oral cavity post swallow which cleared with pt initiated use of liquid wash.  Recommend observation over the next 1-2 days with dys 2 textures prior to diet advancement.  During breakfast, pt independently initiated requested that therapist change the channel to the Today Show.  Pt was able to complete sentences with a simple phrase when given a choice of three and min assist verbal cues; however, he required mod-max assist semantic and phonemic cues to repeat sentences to completion.  Pt was returned to room and left with call bell within reach.  Pt was re-educated on use of call bell with accurate return demonstration.  Pt requested to use the bathroom and was able to generate a functional phrase to convey needs to nursing staff ("I need help,") with supervision question cues.  Goals updated on this date to reflect current progress and plan of care.         Function:   Eating Eating   Modified Consistency Diet: Yes Eating Assist Level: Supervision or verbal cues           Cognition Comprehension Comprehension assist level: Understands basic 75 - 89% of the time/ requires cueing 10 - 24% of the time  Expression   Expression assist level: Expresses basic 50 - 74% of the time/requires cueing 25 - 49% of the time. Needs to repeat parts of sentences.  Social Interaction Social Interaction assist level: Interacts appropriately 90% of the time - Needs monitoring or encouragement for participation or interaction.  Problem Solving Problem solving assist level: Solves basic 75 - 89% of the time/requires cueing 10 - 24% of the time  Memory Memory assist level: Recognizes or recalls 50 - 74% of the time/requires cueing 25 - 49% of the time   General    Pain Pain Assessment Pain Assessment: No/denies pain  Therapy/Group: Individual Therapy  Isaiah [name],  Isaiah [name] 10/19/2015, 12:06 PM

## 2015-10-19 NOTE — Consult Note (Signed)
   [facility] Inpatient Consult   10/19/2015  [place] 1966/07/25 [id number]   Mrs. Wellnitz returned writer's call earlier today. Made her aware that [facility] will be following along and available as a resource if needed. Advised her to also contact [employer] for case management services post hospital discharge. Appreciative of information. Will continue to follow along.  Marthenia Rolling, MSN-Ed, RN,BSN [facility] Liaison [phone]

## 2015-10-19 NOTE — Progress Notes (Signed)
Occupational Therapy Weekly Progress Note  Patient Details  Name: Kremlin MRN: [id number] Date of Birth: 27-Oct-1965  Beginning of progress report period: October 13, 2015 End of progress report period: October 19, 2015  Today's Date: 10/19/2015 OT Individual Time: 1415-1445 OT Individual Time Calculation (min): 30 min    Patient has met 0 of 4 short term goals.  Pt is making slow gains with OT this week but anticipate this will improve as he has been limited secondary to medical status.  Currently, he requires mod to max assist for bathing and dressing tasks as well as max assist for stand pivot transfers.  He continues to need max instructional cueing to proceed through ADL tasks secondary to motor planning deficits.  RUE functional use has improved some as well and he demonstrates shoulder flexion, elbow flexion, and some digit flexion at a Brunnstrum stage IV movement at this time.  Functionally, he needs max assist for use with bathing tasks at this time.  Feel he continues to need comprehensive OT at this time to increase independence for return home with his spouse.      Patient continues to demonstrate the following deficits: decreased balance, decreased RUE functional use, decreased cognitive processing,  and therefore will continue to benefit from skilled OT intervention to enhance overall performance with BADL.  Patient progressing toward long term goals..  Continue plan of care.  OT Short Term Goals Week 2:  OT Short Term Goal 1 (Week 2): Pt will complete toilet transfer with min assist OT Short Term Goal 2 (Week 2): Pt will complete LB dressing with mod assist OT Short Term Goal 3 (Week 2): Pt will complete UB dressing with min assist OT Short Term Goal 4 (Week 2): Pt will engage in functional activity in standing for 1 minute with min asisst OT Short Term Goal 5 (Week 2): Pt will use the LUE as an active assist for bathing tasks with mod assist.    Skilled Therapeutic  Interventions/Progress Updates:    Pt worked on RUE functional movement during session from sitting and supine position.  Provided dowel rod and ace bandage to assist with RUE functional grip.  Pt completed 4 set of 10 repetitions for shoulder flexion in sitting with emphasis on maintaining both UEs even through the repetition.  Min instructional cueing needed to maintain upright sitting and avoid leaning backwards.  Transitioned to supine for further work on sustained shoulder flexion at 90 degrees and holding for 1 min intervals. Also completed isolated elbow extension exercises for 2 sets of 10 repetitions, with therapist stabilizing the right upper arm on the first set and then pt having to stabilize it himself on the second set with min assist needed.  Instructed pt to complete AROM exercises for the shoulder and elbow while in bed.    Therapy Documentation Precautions:  Precautions Precautions: Fall Precaution Comments: expressive/ receptive difficulties Restrictions Weight Bearing Restrictions: No  Pain: Pain Assessment Pain Assessment: No/denies pain ADL: See Function Navigator for Current Functional Status.   Therapy/Group: Individual Therapy  Tyniya Kuyper OTR/L 10/19/2015, 4:14 PM

## 2015-10-19 NOTE — Progress Notes (Signed)
Patient unable to effectively perform mdi treatment of Fluticasone Furoate-Vilanterol (BREO ELLIPTA).

## 2015-10-19 NOTE — Progress Notes (Signed)
Subjective/Complaints: Speaking more, name simple objects such as ring and watch, Difficulty with the stethoscope ROS-Using yesdenies any chest pain shortness of breath nausea vomiting diarrhea constipation  Objective: Vital Signs: Blood pressure 124/87, pulse 99, temperature 98 F (36.7 C), temperature source Oral, resp. rate 18, height 5' 11"  (1.803 m), weight 96.2 kg (212 lb 1.3 oz), SpO2 96 %. No results found. Results for orders placed or performed during the hospital encounter of 10/12/15 (from the past 72 hour(s))  I-STAT 4, (NA,K, GLUC, HGB,HCT)     Status: Abnormal   Collection Time: 10/16/15  9:16 AM  Result Value Ref Range   Sodium 134 (L) 135 - 145 mmol/L   Potassium 4.3 3.5 - 5.1 mmol/L   Glucose, Bld 168 (H) 65 - 99 mg/dL   HCT 38.0 (L) 39.0 - 52.0 %   Hemoglobin 12.9 (L) 13.0 - 17.0 g/dL  CBC     Status: Abnormal   Collection Time: 10/16/15 11:56 AM  Result Value Ref Range   WBC 13.0 (H) 4.0 - 10.5 K/uL   RBC 3.79 (L) 4.22 - 5.81 MIL/uL   Hemoglobin 10.8 (L) 13.0 - 17.0 g/dL   HCT 32.6 (L) 39.0 - 52.0 %   MCV 86.0 78.0 - 100.0 fL   MCH 28.5 26.0 - 34.0 pg   MCHC 33.1 30.0 - 36.0 g/dL   RDW 18.1 (H) 11.5 - 15.5 %   Platelets 328 150 - 400 K/uL  Basic metabolic panel     Status: Abnormal   Collection Time: 10/16/15 11:56 AM  Result Value Ref Range   Sodium 136 135 - 145 mmol/L   Potassium 5.5 (H) 3.5 - 5.1 mmol/L    Comment: SLIGHT HEMOLYSIS   Chloride 101 101 - 111 mmol/L   CO2 22 22 - 32 mmol/L   Glucose, Bld 167 (H) 65 - 99 mg/dL   BUN 38 (H) 6 - 20 mg/dL   Creatinine, Ser 5.64 (H) 0.61 - 1.24 mg/dL   Calcium 8.9 8.9 - 10.3 mg/dL   GFR calc non Af Amer 11 (L) >60 mL/min   GFR calc Af Amer 12 (L) >60 mL/min    Comment: (NOTE) The eGFR has been calculated using the CKD EPI equation. This calculation has not been validated in all clinical situations. eGFR's persistently <60 mL/min signify possible Chronic Kidney Disease.    Anion gap 13 5 - 15  Heparin  level (unfractionated)     Status: Abnormal   Collection Time: 10/16/15 11:57 AM  Result Value Ref Range   Heparin Unfractionated <0.10 (L) 0.30 - 0.70 IU/mL    Comment:        IF HEPARIN RESULTS ARE BELOW EXPECTED VALUES, AND PATIENT DOSAGE HAS BEEN CONFIRMED, SUGGEST FOLLOW UP TESTING OF ANTITHROMBIN III LEVELS. REPEATED TO VERIFY   Glucose, capillary     Status: Abnormal   Collection Time: 10/16/15 12:10 PM  Result Value Ref Range   Glucose-Capillary 154 (H) 65 - 99 mg/dL   Comment 1 Notify RN   Glucose, capillary     Status: Abnormal   Collection Time: 10/16/15  1:44 PM  Result Value Ref Range   Glucose-Capillary 161 (H) 65 - 99 mg/dL  Glucose, capillary     Status: Abnormal   Collection Time: 10/16/15  9:06 PM  Result Value Ref Range   Glucose-Capillary 135 (H) 65 - 99 mg/dL  Heparin level (unfractionated)     Status: None   Collection Time: 10/16/15 10:58 PM  Result Value Ref  Range   Heparin Unfractionated 0.44 0.30 - 0.70 IU/mL    Comment:        IF HEPARIN RESULTS ARE BELOW EXPECTED VALUES, AND PATIENT DOSAGE HAS BEEN CONFIRMED, SUGGEST FOLLOW UP TESTING OF ANTITHROMBIN III LEVELS.   CBC     Status: Abnormal   Collection Time: 10/17/15  6:17 AM  Result Value Ref Range   WBC 14.1 (H) 4.0 - 10.5 K/uL   RBC 3.73 (L) 4.22 - 5.81 MIL/uL   Hemoglobin 10.5 (L) 13.0 - 17.0 g/dL   HCT 32.1 (L) 39.0 - 52.0 %   MCV 86.1 78.0 - 100.0 fL   MCH 28.2 26.0 - 34.0 pg   MCHC 32.7 30.0 - 36.0 g/dL   RDW 17.8 (H) 11.5 - 15.5 %   Platelets 357 150 - 400 K/uL  Heparin level (unfractionated)     Status: None   Collection Time: 10/17/15  6:17 AM  Result Value Ref Range   Heparin Unfractionated 0.35 0.30 - 0.70 IU/mL    Comment:        IF HEPARIN RESULTS ARE BELOW EXPECTED VALUES, AND PATIENT DOSAGE HAS BEEN CONFIRMED, SUGGEST FOLLOW UP TESTING OF ANTITHROMBIN III LEVELS.   Glucose, capillary     Status: Abnormal   Collection Time: 10/17/15  6:47 AM  Result Value Ref Range    Glucose-Capillary 116 (H) 65 - 99 mg/dL  Glucose, capillary     Status: Abnormal   Collection Time: 10/17/15 12:11 PM  Result Value Ref Range   Glucose-Capillary 231 (H) 65 - 99 mg/dL   Comment 1 Notify RN   Glucose, capillary     Status: Abnormal   Collection Time: 10/17/15  4:22 PM  Result Value Ref Range   Glucose-Capillary 229 (H) 65 - 99 mg/dL   Comment 1 Notify RN   Glucose, capillary     Status: Abnormal   Collection Time: 10/17/15  8:44 PM  Result Value Ref Range   Glucose-Capillary 255 (H) 65 - 99 mg/dL  CBC     Status: Abnormal   Collection Time: 10/18/15  8:14 AM  Result Value Ref Range   WBC 15.2 (H) 4.0 - 10.5 K/uL   RBC 3.64 (L) 4.22 - 5.81 MIL/uL   Hemoglobin 10.6 (L) 13.0 - 17.0 g/dL   HCT 31.4 (L) 39.0 - 52.0 %   MCV 86.3 78.0 - 100.0 fL   MCH 29.1 26.0 - 34.0 pg   MCHC 33.8 30.0 - 36.0 g/dL   RDW 17.8 (H) 11.5 - 15.5 %   Platelets 371 150 - 400 K/uL  Heparin level (unfractionated)     Status: Abnormal   Collection Time: 10/18/15  8:14 AM  Result Value Ref Range   Heparin Unfractionated 0.17 (L) 0.30 - 0.70 IU/mL    Comment:        IF HEPARIN RESULTS ARE BELOW EXPECTED VALUES, AND PATIENT DOSAGE HAS BEEN CONFIRMED, SUGGEST FOLLOW UP TESTING OF ANTITHROMBIN III LEVELS.   Glucose, capillary     Status: Abnormal   Collection Time: 10/18/15  8:51 AM  Result Value Ref Range   Glucose-Capillary 153 (H) 65 - 99 mg/dL   Comment 1 Notify RN   Glucose, capillary     Status: Abnormal   Collection Time: 10/18/15 11:22 AM  Result Value Ref Range   Glucose-Capillary 262 (H) 65 - 99 mg/dL   Comment 1 Notify RN   Renal function panel     Status: Abnormal   Collection Time: 10/18/15  4:20  PM  Result Value Ref Range   Sodium 132 (L) 135 - 145 mmol/L   Potassium 4.4 3.5 - 5.1 mmol/L   Chloride 98 (L) 101 - 111 mmol/L   CO2 23 22 - 32 mmol/L   Glucose, Bld 306 (H) 65 - 99 mg/dL   BUN 30 (H) 6 - 20 mg/dL   Creatinine, Ser 5.16 (H) 0.61 - 1.24 mg/dL   Calcium  9.0 8.9 - 10.3 mg/dL   Phosphorus 3.3 2.5 - 4.6 mg/dL   Albumin 2.8 (L) 3.5 - 5.0 g/dL   GFR calc non Af Amer 12 (L) >60 mL/min   GFR calc Af Amer 14 (L) >60 mL/min    Comment: (NOTE) The eGFR has been calculated using the CKD EPI equation. This calculation has not been validated in all clinical situations. eGFR's persistently <60 mL/min signify possible Chronic Kidney Disease.    Anion gap 11 5 - 15  Heparin level (unfractionated)     Status: Abnormal   Collection Time: 10/18/15  5:46 PM  Result Value Ref Range   Heparin Unfractionated 0.25 (L) 0.30 - 0.70 IU/mL    Comment:        IF HEPARIN RESULTS ARE BELOW EXPECTED VALUES, AND PATIENT DOSAGE HAS BEEN CONFIRMED, SUGGEST FOLLOW UP TESTING OF ANTITHROMBIN III LEVELS.   Glucose, capillary     Status: Abnormal   Collection Time: 10/18/15  9:43 PM  Result Value Ref Range   Glucose-Capillary 142 (H) 65 - 99 mg/dL  Heparin level (unfractionated)     Status: None   Collection Time: 10/19/15 12:33 AM  Result Value Ref Range   Heparin Unfractionated 0.63 0.30 - 0.70 IU/mL    Comment:        IF HEPARIN RESULTS ARE BELOW EXPECTED VALUES, AND PATIENT DOSAGE HAS BEEN CONFIRMED, SUGGEST FOLLOW UP TESTING OF ANTITHROMBIN III LEVELS.   CBC     Status: Abnormal   Collection Time: 10/19/15  6:00 AM  Result Value Ref Range   WBC 15.4 (H) 4.0 - 10.5 K/uL   RBC 3.93 (L) 4.22 - 5.81 MIL/uL   Hemoglobin 11.1 (L) 13.0 - 17.0 g/dL   HCT 33.8 (L) 39.0 - 52.0 %   MCV 86.0 78.0 - 100.0 fL   MCH 28.2 26.0 - 34.0 pg   MCHC 32.8 30.0 - 36.0 g/dL   RDW 17.6 (H) 11.5 - 15.5 %   Platelets 397 150 - 400 K/uL  Glucose, capillary     Status: Abnormal   Collection Time: 10/19/15  6:34 AM  Result Value Ref Range   Glucose-Capillary 108 (H) 65 - 99 mg/dL      General: No acute distress Mood and affect are appropriate Heart: Regular rate and rhythm no rubs murmurs or extra sounds Lungs: Clear to auscultation, breathing unlabored, no rales or  wheezes Abdomen: Positive bowel sounds, soft nontender to palpation, nondistended Extremities: No clubbing, cyanosis, or edema Skin: No evidence of breakdown, no evidence of rash Neurologic: Cranial nerves II through XII intact, motor strength is 5/5 in left deltoid, bicep, tricep, grip, hip flexor, knee extensors, ankle dorsiflexor and plantar flexor 2 minus in the right deltoid, biceps, triceps, grip, 3 minus left hip flexor and extensor, 2 minus left ankle dorsiflexor Sensory examDifficult to assess secondary to a aphasia, does feel pinch in all 4 limbs Cerebellar exam normal finger to nose to finger as well as heel to shin in bilateral upper and lower extremities Musculoskeletal: Full range of motion in all 4 extremities.  No joint swelling   Assessment/Plan: 1. 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 Physiatrist is providing close team supervision and 24 hour management of active medical problems listed below. Physiatrist and rehab team continue to assess barriers to discharge/monitor patient progress toward functional and medical goals. FIM: Function - Bathing Position: Wheelchair/chair at sink Body parts bathed by patient: Right arm, Chest, Abdomen (UB only this session) Body parts bathed by helper: Left arm, Back Bathing not applicable: Front perineal area, Buttocks, Right upper leg, Left upper leg, Right lower leg, Left lower leg Assist Level:  (min A)  Function- Upper Body Dressing/Undressing What is the patient wearing?: Pull over shirt/dress Pull over shirt/dress - Perfomed by patient: Put head through opening, Pull shirt over trunk Pull over shirt/dress - Perfomed by helper: Thread/unthread left sleeve, Thread/unthread right sleeve Assist Level:  (mod A) Function - Lower Body Dressing/Undressing What is  the patient wearing?: Pants, Non-skid slipper socks Position: Wheelchair/chair at sink Pants- Performed by patient: Thread/unthread left pants leg Pants- Performed by helper: Thread/unthread right pants leg, Pull pants up/down Non-skid slipper socks- Performed by helper: Don/doff right sock, Don/doff left sock Assist for footwear: Dependant Assist for lower body dressing: Touching or steadying assistance (Pt > 75%)        Function - Chair/bed transfer Chair/bed transfer method: Stand pivot Chair/bed transfer assist level: Moderate assist (Pt 50 - 74%/lift or lower) Chair/bed transfer assistive device: Bedrails Chair/bed transfer details: Verbal cues for precautions/safety, Manual facilitation for weight shifting  Function - Locomotion: Wheelchair Will patient use wheelchair at discharge?: No Type: Manual Max wheelchair distance: 20 Assist Level: Touching or steadying assistance (Pt > 75%) Wheel 50 feet with 2 turns activity did not occur: Safety/medical concerns Assist Level: Moderate assistance (Pt 50 - 74%) Wheel 150 feet activity did not occur: Safety/medical concerns Turns around,maneuvers to table,bed, and toilet,negotiates 3% grade,maneuvers on rugs and over doorsills: No Function - Locomotion: Ambulation Ambulation activity did not occur: Safety/medical concerns Assistive device: Walker-rolling Max distance: 50 Assist level: 2 helpers Assist level: Moderate assist (Pt 50 - 74%) Assist level: Moderate assist (Pt 50 - 74%) Walk 150 feet activity did not occur: Safety/medical concerns Walk 10 feet on uneven surfaces activity did not occur: Safety/medical concerns  Function - Comprehension Comprehension: Auditory Comprehension assist level: Understands basic 75 - 89% of the time/ requires cueing 10 - 24% of the time  Function - Expression Expression: Verbal Expression assist level: Expresses basic 25 - 49% of the time/requires cueing 50 - 75% of the time. Uses single  words/gestures.  Function - Social Interaction Social Interaction assist level: Interacts appropriately 90% of the time - Needs monitoring or encouragement for participation or interaction.  Function - Problem Solving Problem solving assist level: Solves basic 75 - 89% of the time/requires cueing 10 - 24% of the time  Function - Memory Memory assist level: Recognizes or recalls 75 - 89% of the time/requires cueing 10 - 24% of the time Patient normally able to recall (first 3 days only): That he or she is in a hospital, Staff names and faces  Medical Problem List and Plan: 1. Right hemiparesis and aphasia secondary to bi-cerebral embolic infarcts (L>R)- ask neuro eval once imaging from [facility] available  2. PE/DVT/Stoke/Anticoagulation: Pharmaceutical: Lovenox bid per records from [place].  use iv heparin gtt at this time. per hematologist Dr Alen Blew , ?  warfarin 3. Pain Management: Ultram prn.  4. Mood: LCSW to follow for evaluation and support.  5. Neuropsych: This patient is not fully capable of making decisions on his own behalf. 6. Skin/Wound Care: Routine pressure relief measures.  7. Fluids/Electrolytes/Nutrition: Renal restrictions. 1200 cc/FR.  8. DM type 2: Monitor BS ac/hs. Continue levemir and use SSI for elevated BS.A.m. CBG In desired range, continue current dose Levemir 9. HTN: Monitor BP bid and titrate medications as needed. Elevated at admission. Will set parameters on Midodrine.  10. ESRD on HD: per [facility]. HD tomorrow after therapies. Continue diet/volume mgt, Tunnel dialysis catheter Placed Nephrology is following      11. Leukocytosis will check UA,no breathing difficulties or coughing.UA negative except for elevated protein as expected from renal failure, WBC stable 12.  Hx of GI bleed will monitor for reurrence,Hgb stable at 11.1g 13. Dysphagia- Discussed with speech therapy may be able to upgrade solids to D 2 LOS (Days) 7 A FACE TO FACE EVALUATION WAS  PERFORMED  Ryoma Nofziger E 10/19/2015, 8:54 AM

## 2015-10-19 NOTE — Progress Notes (Signed)
S: No new CO.  Feels well O:BP 119/83 mmHg  Pulse 109  Temp(Src) 98.4 F (36.9 C) (Oral)  Resp 18  Ht 5' 11"  (1.803 m)  Wt 96.2 kg (212 lb 1.3 oz)  BMI 29.59 kg/m2  SpO2 99%  Intake/Output Summary (Last 24 hours) at 10/19/15 1455 Last data filed at 10/19/15 1238  Gross per 24 hour  Intake    240 ml  Output   3000 ml  Net  -2760 ml   Weight change: -4.998 kg (-11 lb 0.3 oz) OZY:YQMGN and alert.  Speech a little better CVS:RRR Resp:clear Abd:+ BS NTND Ext:No edema NEURO: Awake and alert, aphasic Rt permcath   . antiseptic oral rinse  7 mL Mouth Rinse BID  . aspirin EC  81 mg Oral Daily  . darbepoetin (ARANESP) injection - DIALYSIS  100 mcg Intravenous Q Tue-HD  . Fluticasone Furoate-Vilanterol  1 puff Inhalation Daily  . insulin aspart  0-5 Units Subcutaneous QHS  . insulin aspart  0-9 Units Subcutaneous TID WC  . insulin detemir  20 Units Subcutaneous QHS  . magic mouthwash w/lidocaine  5 mL Oral QID  . midodrine  5 mg Oral TID WC  . montelukast  10 mg Oral QHS  . multivitamin  1 tablet Oral QHS  . pantoprazole  40 mg Oral BID   No results found. BMET    Component Value Date/Time   NA 132* 10/18/2015 1620   NA 138 09/06/2015 1306   K 4.4 10/18/2015 1620   K 4.8 09/06/2015 1306   CL 98* 10/18/2015 1620   CO2 23 10/18/2015 1620   CO2 15* 09/06/2015 1306   GLUCOSE 306* 10/18/2015 1620   GLUCOSE 98 09/06/2015 1306   BUN 30* 10/18/2015 1620   BUN 54.0* 09/06/2015 1306   CREATININE 5.16* 10/18/2015 1620   CREATININE 5.8* 09/06/2015 1306   CALCIUM 9.0 10/18/2015 1620   CALCIUM 7.6* 09/06/2015 1306   GFRNONAA 12* 10/18/2015 1620   GFRAA 14* 10/18/2015 1620   CBC    Component Value Date/Time   WBC 15.4* 10/19/2015 0600   WBC 17.1* 09/06/2015 1306   RBC 3.93* 10/19/2015 0600   RBC 4.26 09/06/2015 1306   RBC 3.07* 09/29/2014 1810   HGB 11.1* 10/19/2015 0600   HGB 11.3* 09/06/2015 1306   HCT 33.8* 10/19/2015 0600   HCT 32.8* 09/06/2015 1306   PLT 397  10/19/2015 0600   PLT 322 09/06/2015 1306   MCV 86.0 10/19/2015 0600   MCV 77.0* 09/06/2015 1306   MCH 28.2 10/19/2015 0600   MCH 26.5* 09/06/2015 1306   MCHC 32.8 10/19/2015 0600   MCHC 34.5 09/06/2015 1306   RDW 17.6* 10/19/2015 0600   RDW 19.1* 09/06/2015 1306   LYMPHSABS 1.6 09/06/2015 1306   LYMPHSABS 4.5* 10/04/2012 1340   MONOABS 1.1* 09/06/2015 1306   MONOABS 1.7* 10/04/2012 1340   EOSABS 0.3 09/06/2015 1306   EOSABS 0.8* 10/04/2012 1340   BASOSABS 0.1 09/06/2015 1306   BASOSABS 0.2* 10/04/2012 1340     Assessment:  1. New ESRD   2. Embolic CVA 3. Anemia on aranesp 4. Sec HPTH  PTH 211 so not on Vit D 5. Hypotension on midodrine  Plan: 1. HD tomorrow 2. Working on outpt spot at [employer].  Adrieana Fennelly T

## 2015-10-19 NOTE — Progress Notes (Signed)
ANTICOAGULATION CONSULT NOTE - Follow Up Consult  Pharmacy Consult for heparin Indication: PE/DVT/stroke  Allergies  Allergen Reactions  . Codeine Other (See Comments)    Pt unknown  . Codeine Rash and Other (See Comments)    Unknown reaction (patient says it was more serious than just a rash, but he can't remember what happened)    Patient Measurements: Height: 5' 11"  (180.3 cm) Weight: 195 lb 12.3 oz (88.8 kg) IBW/kg (Calculated) : 75.3 Heparin Dosing Weight: 96 kg   Vital Signs: Temp: 97.9 F (36.6 C) (02/09 2055) Temp Source: Oral (02/09 2055) BP: 130/81 mmHg (02/09 2055) Pulse Rate: 104 (02/09 2055)  Labs:  Recent Labs  10/16/15 1156  10/17/15 0617 10/18/15 0814 10/18/15 1620 10/18/15 1746 10/19/15 0033  HGB 10.8*  --  10.5* 10.6*  --   --   --   HCT 32.6*  --  32.1* 31.4*  --   --   --   PLT 328  --  357 371  --   --   --   HEPARINUNFRC  --   < > 0.35 0.17*  --  0.25* 0.63  CREATININE 5.64*  --   --   --  5.16*  --   --   < > = values in this interval not displayed.  Estimated Creatinine Clearance: 18 mL/min (by C-G formula based on Cr of 5.16).   Medications:  Scheduled:  . antiseptic oral rinse  7 mL Mouth Rinse BID  . aspirin EC  81 mg Oral Daily  . darbepoetin (ARANESP) injection - DIALYSIS  100 mcg Intravenous Q Tue-HD  . Fluticasone Furoate-Vilanterol  1 puff Inhalation Daily  . insulin aspart  0-5 Units Subcutaneous QHS  . insulin aspart  0-9 Units Subcutaneous TID WC  . insulin detemir  20 Units Subcutaneous QHS  . magic mouthwash w/lidocaine  5 mL Oral QID  . midodrine  5 mg Oral TID WC  . montelukast  10 mg Oral QHS  . multivitamin  1 tablet Oral QHS  . pantoprazole  40 mg Oral BID   Infusions:  . sodium chloride 10 mL/hr at 10/18/15 2157  . heparin 1,400 Units/hr (10/18/15 1039)    Assessment: 50 yo male with PE/DVT/stroke is currently on IV heparin.  Current heparin level now up to 0.63 (supratherapeutic for lower goal range of  0.3-0.5), noted last subtherapeutic level drawn while pt was actively dialyzing. No bleeding noted.   Goal of Therapy:  Heparin level 0.3-0.5 units/ml Monitor platelets by anticoagulation protocol: Yes   Plan:  - Decrease heparin drip to 1300 units/hr - F/u 8 hr heparin level  Sherlon Handing, PharmD, BCPS Clinical pharmacist, pager [phone]  10/19/2015 1:00 AM

## 2015-10-19 NOTE — Progress Notes (Signed)
Social Work Patient ID: Isaiah [name], male   DOB: May 01, 1966, 50 y.o.   MRN: [id number] Update faxed to [facility] and [facility] case managers for continued approval for pt's stay here. Will await responses.

## 2015-10-19 NOTE — Progress Notes (Signed)
Patient has home cpap machine. Places self on and off.

## 2015-10-19 NOTE — Progress Notes (Signed)
Physical Therapy Note  Patient Details  Name: Isaiah [name] [facility] MRN: [id number] Date of Birth: 11-12-1965 Today's Date: 10/19/2015    Time: [phone] 55 minutes  1:1 No c/o pain.  Pt performed gait 110' x 2 with RW with Rt wrist splint with min A, facilitation for wt shift.  Standing NMR with stepping and tapping with alternating LEs, pt with min/mod A for balance, fatigues very easily performing balance without UE support, frequent rest breaks needed.  Standing squatting and reaching task with pt picking up cups off of floor.  Pt min A for balance, once again multiple rest breaks due to fatigue.  Sit to stands from low surface without UE support with min A for forward wt shift 3 x 5.  Pt limited by fatigue this session but improving balance and mobility.   Annais Crafts 10/19/2015, 10:28 AM

## 2015-10-19 NOTE — Progress Notes (Signed)
ANTICOAGULATION CONSULT NOTE - Follow Up Consult  Pharmacy Consult for heparin Indication: PE/DVT/stroke  Allergies  Allergen Reactions  . Codeine Other (See Comments)    Pt unknown  . Codeine Rash and Other (See Comments)    Unknown reaction (patient says it was more serious than just a rash, but he can't remember what happened)    Patient Measurements: Height: 5' 11"  (180.3 cm) Weight: 212 lb 1.3 oz (96.2 kg) IBW/kg (Calculated) : 75.3 Heparin Dosing Weight:   Vital Signs: Temp: 98 F (36.7 C) (02/10 0510) Temp Source: Oral (02/10 0510) BP: 124/87 mmHg (02/10 0510) Pulse Rate: 99 (02/10 0510)  Labs:  Recent Labs  10/16/15 1156  10/17/15 0617 10/18/15 0814 10/18/15 1620 10/18/15 1746 10/19/15 0033 10/19/15 0600  HGB 10.8*  --  10.5* 10.6*  --   --   --  11.1*  HCT 32.6*  --  32.1* 31.4*  --   --   --  33.8*  PLT 328  --  357 371  --   --   --  397  HEPARINUNFRC  --   < > 0.35 0.17*  --  0.25* 0.63  --   CREATININE 5.64*  --   --   --  5.16*  --   --   --   < > = values in this interval not displayed.  Estimated Creatinine Clearance: 20.1 mL/min (by C-G formula based on Cr of 5.16).   Medications:  Scheduled:  . antiseptic oral rinse  7 mL Mouth Rinse BID  . aspirin EC  81 mg Oral Daily  . darbepoetin (ARANESP) injection - DIALYSIS  100 mcg Intravenous Q Tue-HD  . Fluticasone Furoate-Vilanterol  1 puff Inhalation Daily  . insulin aspart  0-5 Units Subcutaneous QHS  . insulin aspart  0-9 Units Subcutaneous TID WC  . insulin detemir  20 Units Subcutaneous QHS  . magic mouthwash w/lidocaine  5 mL Oral QID  . midodrine  5 mg Oral TID WC  . montelukast  10 mg Oral QHS  . multivitamin  1 tablet Oral QHS  . pantoprazole  40 mg Oral BID   Infusions:  . sodium chloride 10 mL/hr at 10/18/15 2157  . heparin 1,300 Units/hr (10/19/15 3570)    Assessment: 50 yo male with PE/DVT/stroke is currently on subtherapeutic heparin.  Heparin level is 0.21. CBC is  stable  Goal of Therapy:  Heparin level 0.3-0.5 units/ml Monitor platelets by anticoagulation protocol: Yes   Plan:  - increase heparin to 1350 units/hr (patient was supratherapeutic at 1400 units/hr) - 8hr heparin level -   Haislee Corso, Tsz-Yin 10/19/2015,8:21 AM

## 2015-10-19 NOTE — Progress Notes (Signed)
ANTICOAGULATION CONSULT NOTE - Follow Up Consult  Pharmacy Consult for heparin Indication: PE/DVT/stroke  Allergies  Allergen Reactions  . Codeine Other (See Comments)    Pt unknown  . Codeine Rash and Other (See Comments)    Unknown reaction (patient says it was more serious than just a rash, but he can't remember what happened)    Patient Measurements: Height: 5' 11"  (180.3 cm) Weight: 212 lb 1.3 oz (96.2 kg) IBW/kg (Calculated) : 75.3 Heparin Dosing Weight: 95kg  Vital Signs: Temp: 98.4 F (36.9 C) (02/10 1259) Temp Source: Oral (02/10 1259) BP: 119/83 mmHg (02/10 1259) Pulse Rate: 109 (02/10 1259)  Labs:  Recent Labs  10/17/15 0617 10/18/15 0814 10/18/15 1620  10/19/15 0033 10/19/15 0600 10/19/15 1027 10/19/15 1923  HGB 10.5* 10.6*  --   --   --  11.1*  --   --   HCT 32.1* 31.4*  --   --   --  33.8*  --   --   PLT 357 371  --   --   --  397  --   --   HEPARINUNFRC 0.35 0.17*  --   < > 0.63  --  0.21* 0.10*  CREATININE  --   --  5.16*  --   --   --   --   --   < > = values in this interval not displayed.  Estimated Creatinine Clearance: 20.1 mL/min (by C-G formula based on Cr of 5.16).  Assessment: 50 yo male with hx PE/DVT/stroke is currently on subtherapeutic heparin.  Heparin level was 0.21units/mL this morning, dropped to 0.1 units/mL this evening despite rate increase. No bleeding noted.  Goal of Therapy:  Heparin level 0.3-0.5 units/ml Monitor platelets by anticoagulation protocol: Yes   Plan:  - increase heparin to 1450 units/hr - 8hr heparin level with AM labs - daily HL and CBC  Erendida Wrenn D. Kamerin Axford, PharmD, BCPS Clinical Pharmacist Pager: [phone] 10/19/2015 8:33 PM

## 2015-10-19 NOTE — Progress Notes (Signed)
Occupational Therapy Session Note  Patient Details  Name: Isaiah [name] MRN: [id number] Date of Birth: 1965-12-28  Today's Date: 10/19/2015 OT Individual Time: 0254-8628 OT Individual Time Calculation (min): 46 min      Skilled Therapeutic Interventions/Progress Updates:    Pt was taken to the therapy gym for this session to work on RUE functional movement an use.  Pt transferred to therapy mat with max assist stand pivot, maintaining flexed trunk and head in standing.  He continues with posterior pelvic tilt and rounded shoulders in sitting as well.  Worked on application of NMES for the RUE to assist with activation of digit flexors and extensors.  Utilized Exercise mode for alternating flexion and extension with intensity set on level 9 for extensors and level 8 for flexors.  Pt tolerated 10 mins of stimulation with no adverse reactions.  He was instructed to assist the movements as well with stimulation and for part of session had him focus on scapular adduction with stimulation to hand in order to help correct thoracic rounding.  Once stimulation was completed had pt transfer back to wheelchair and he was taken back to the room for lunch.  Safety belt in place as well as 1/2 lap tray for RUE support.  Call button and phone within reach as well.    Therapy Documentation Precautions:  Precautions Precautions: Fall Precaution Comments: expressive/ receptive difficulties Restrictions Weight Bearing Restrictions: No  Pain: Pain Assessment Pain Assessment: No/denies pain ADL: See Function Navigator for Current Functional Status.   Therapy/Group: Individual Therapy  Vraj Denardo OTR/L 10/19/2015, 12:49 PM

## 2015-10-20 ENCOUNTER — Inpatient Hospital Stay (HOSPITAL_COMMUNITY): Payer: Self-pay | Admitting: Speech Pathology

## 2015-10-20 DIAGNOSIS — R7309 Other abnormal glucose: Secondary | ICD-10-CM

## 2015-10-20 DIAGNOSIS — D62 Acute posthemorrhagic anemia: Secondary | ICD-10-CM

## 2015-10-20 LAB — CBC
HCT: 32.5 % — ABNORMAL LOW (ref 39.0–52.0)
HCT: 32.8 % — ABNORMAL LOW (ref 39.0–52.0)
Hemoglobin: 10.9 g/dL — ABNORMAL LOW (ref 13.0–17.0)
Hemoglobin: 11 g/dL — ABNORMAL LOW (ref 13.0–17.0)
MCH: 29.5 pg (ref 26.0–34.0)
MCH: 29.5 pg (ref 26.0–34.0)
MCHC: 33.5 g/dL (ref 30.0–36.0)
MCHC: 33.5 g/dL (ref 30.0–36.0)
MCV: 87.9 fL (ref 78.0–100.0)
MCV: 88.1 fL (ref 78.0–100.0)
Platelets: 476 10*3/uL — ABNORMAL HIGH (ref 150–400)
Platelets: 485 10*3/uL — ABNORMAL HIGH (ref 150–400)
RBC: 3.69 MIL/uL — ABNORMAL LOW (ref 4.22–5.81)
RBC: 3.73 MIL/uL — ABNORMAL LOW (ref 4.22–5.81)
RDW: 17.8 % — ABNORMAL HIGH (ref 11.5–15.5)
RDW: 17.9 % — ABNORMAL HIGH (ref 11.5–15.5)
WBC: 16.3 10*3/uL — ABNORMAL HIGH (ref 4.0–10.5)
WBC: 16.9 10*3/uL — ABNORMAL HIGH (ref 4.0–10.5)

## 2015-10-20 LAB — RENAL FUNCTION PANEL
Albumin: 2.7 g/dL — ABNORMAL LOW (ref 3.5–5.0)
Anion gap: 10 (ref 5–15)
BUN: 18 mg/dL (ref 6–20)
CO2: 26 mmol/L (ref 22–32)
Calcium: 8.9 mg/dL (ref 8.9–10.3)
Chloride: 97 mmol/L — ABNORMAL LOW (ref 101–111)
Creatinine, Ser: 5.07 mg/dL — ABNORMAL HIGH (ref 0.61–1.24)
GFR calc Af Amer: 14 mL/min — ABNORMAL LOW (ref >60)
GFR calc non Af Amer: 12 mL/min — ABNORMAL LOW (ref >60)
Glucose, Bld: 175 mg/dL — ABNORMAL HIGH (ref 65–99)
Phosphorus: 3.5 mg/dL (ref 2.5–4.6)
Potassium: 3.9 mmol/L (ref 3.5–5.1)
Sodium: 133 mmol/L — ABNORMAL LOW (ref 135–145)

## 2015-10-20 LAB — HEPARIN LEVEL (UNFRACTIONATED): Heparin Unfractionated: 0.2 IU/mL — ABNORMAL LOW (ref 0.30–0.70)

## 2015-10-20 LAB — GLUCOSE, CAPILLARY
Glucose-Capillary: 94 mg/dL (ref 65–99)
Glucose-Capillary: 159 mg/dL — ABNORMAL HIGH (ref 65–99)
Glucose-Capillary: 77 mg/dL (ref 65–99)
Glucose-Capillary: 218 mg/dL — ABNORMAL HIGH (ref 65–99)

## 2015-10-20 NOTE — Progress Notes (Signed)
Patient has home CPAP and places him self on.

## 2015-10-20 NOTE — Progress Notes (Signed)
ANTICOAGULATION CONSULT NOTE - Follow Up Consult  Pharmacy Consult for heparin Indication: PE/DVT/stroke  Allergies  Allergen Reactions  . Codeine Other (See Comments)    Pt unknown  . Codeine Rash and Other (See Comments)    Unknown reaction (patient says it was more serious than just a rash, but he can't remember what happened)    Patient Measurements: Height: 5' 11"  (180.3 cm) Weight: 206 lb 5.6 oz (93.6 kg) IBW/kg (Calculated) : 75.3 Heparin Dosing Weight: 95kg  Vital Signs: Temp: 98.7 F (37.1 C) (02/11 1200) Temp Source: Oral (02/11 1200) BP: 107/77 mmHg (02/11 1500) Pulse Rate: 98 (02/11 1500)  Labs:  Recent Labs  10/18/15 1620  10/19/15 0600 10/19/15 1027 10/19/15 1923 10/20/15 1233 10/20/15 1243  HGB  --   --  11.1*  --   --  10.9* 11.0*  HCT  --   --  33.8*  --   --  32.5* 32.8*  PLT  --   --  397  --   --  485* 476*  HEPARINUNFRC  --   < >  --  0.21* 0.10* 0.20*  --   CREATININE 5.16*  --   --   --   --   --  5.07*  < > = values in this interval not displayed.  Estimated Creatinine Clearance: 20.1 mL/min (by C-G formula based on Cr of 5.07).  Assessment: 50 yo male with hx PE/DVT/stroke is currently on subtherapeutic heparin. HL remains subtherapeutic at 0.20 after rate increase to 1450 units/hr yesterday evening. Verified correct rate with RN in HD. Will increase rate slightly. Hgb and plt stable. No s/sx bleeding noted.    Goal of Therapy:  Heparin level 0.3-0.5 units/ml Monitor platelets by anticoagulation protocol: Yes   Plan:  Increase heparin to 1550 units/hr  8 hr HL Daily HL, CBC Monitor for s/sx of bleeding   Stephens November, PharmD PGY1 Pharmacy Resident 10/20/2015 3:22 PM

## 2015-10-20 NOTE — Progress Notes (Signed)
Subjective/Complaints: Patient seen lying in bed this morning with his CPAP on. He indicates he slept well last night.  ROS- appears to deny CP, SOB, N/V/D  Objective: Vital Signs: Blood pressure 121/77, pulse 95, temperature 98 F (36.7 C), temperature source Oral, resp. rate 18, height 5' 11"  (1.803 m), weight 90.4 kg (199 lb 4.7 oz), SpO2 99 %. No results found. Results for orders placed or performed during the hospital encounter of 10/12/15 (from the past 72 hour(s))  Glucose, capillary     Status: Abnormal   Collection Time: 10/17/15 12:11 PM  Result Value Ref Range   Glucose-Capillary 231 (H) 65 - 99 mg/dL   Comment 1 Notify RN   Glucose, capillary     Status: Abnormal   Collection Time: 10/17/15  4:22 PM  Result Value Ref Range   Glucose-Capillary 229 (H) 65 - 99 mg/dL   Comment 1 Notify RN   Glucose, capillary     Status: Abnormal   Collection Time: 10/17/15  8:44 PM  Result Value Ref Range   Glucose-Capillary 255 (H) 65 - 99 mg/dL  CBC     Status: Abnormal   Collection Time: 10/18/15  8:14 AM  Result Value Ref Range   WBC 15.2 (H) 4.0 - 10.5 K/uL   RBC 3.64 (L) 4.22 - 5.81 MIL/uL   Hemoglobin 10.6 (L) 13.0 - 17.0 g/dL   HCT 31.4 (L) 39.0 - 52.0 %   MCV 86.3 78.0 - 100.0 fL   MCH 29.1 26.0 - 34.0 pg   MCHC 33.8 30.0 - 36.0 g/dL   RDW 17.8 (H) 11.5 - 15.5 %   Platelets 371 150 - 400 K/uL  Heparin level (unfractionated)     Status: Abnormal   Collection Time: 10/18/15  8:14 AM  Result Value Ref Range   Heparin Unfractionated 0.17 (L) 0.30 - 0.70 IU/mL    Comment:        IF HEPARIN RESULTS ARE BELOW EXPECTED VALUES, AND PATIENT DOSAGE HAS BEEN CONFIRMED, SUGGEST FOLLOW UP TESTING OF ANTITHROMBIN III LEVELS.   Glucose, capillary     Status: Abnormal   Collection Time: 10/18/15  8:51 AM  Result Value Ref Range   Glucose-Capillary 153 (H) 65 - 99 mg/dL   Comment 1 Notify RN   Glucose, capillary     Status: Abnormal   Collection Time: 10/18/15 11:22 AM  Result  Value Ref Range   Glucose-Capillary 262 (H) 65 - 99 mg/dL   Comment 1 Notify RN   Renal function panel     Status: Abnormal   Collection Time: 10/18/15  4:20 PM  Result Value Ref Range   Sodium 132 (L) 135 - 145 mmol/L   Potassium 4.4 3.5 - 5.1 mmol/L   Chloride 98 (L) 101 - 111 mmol/L   CO2 23 22 - 32 mmol/L   Glucose, Bld 306 (H) 65 - 99 mg/dL   BUN 30 (H) 6 - 20 mg/dL   Creatinine, Ser 5.16 (H) 0.61 - 1.24 mg/dL   Calcium 9.0 8.9 - 10.3 mg/dL   Phosphorus 3.3 2.5 - 4.6 mg/dL   Albumin 2.8 (L) 3.5 - 5.0 g/dL   GFR calc non Af Amer 12 (L) >60 mL/min   GFR calc Af Amer 14 (L) >60 mL/min    Comment: (NOTE) The eGFR has been calculated using the CKD EPI equation. This calculation has not been validated in all clinical situations. eGFR's persistently <60 mL/min signify possible Chronic Kidney Disease.    Anion gap 11  5 - 15  Heparin level (unfractionated)     Status: Abnormal   Collection Time: 10/18/15  5:46 PM  Result Value Ref Range   Heparin Unfractionated 0.25 (L) 0.30 - 0.70 IU/mL    Comment:        IF HEPARIN RESULTS ARE BELOW EXPECTED VALUES, AND PATIENT DOSAGE HAS BEEN CONFIRMED, SUGGEST FOLLOW UP TESTING OF ANTITHROMBIN III LEVELS.   Glucose, capillary     Status: Abnormal   Collection Time: 10/18/15  9:43 PM  Result Value Ref Range   Glucose-Capillary 142 (H) 65 - 99 mg/dL  Heparin level (unfractionated)     Status: None   Collection Time: 10/19/15 12:33 AM  Result Value Ref Range   Heparin Unfractionated 0.63 0.30 - 0.70 IU/mL    Comment:        IF HEPARIN RESULTS ARE BELOW EXPECTED VALUES, AND PATIENT DOSAGE HAS BEEN CONFIRMED, SUGGEST FOLLOW UP TESTING OF ANTITHROMBIN III LEVELS.   CBC     Status: Abnormal   Collection Time: 10/19/15  6:00 AM  Result Value Ref Range   WBC 15.4 (H) 4.0 - 10.5 K/uL   RBC 3.93 (L) 4.22 - 5.81 MIL/uL   Hemoglobin 11.1 (L) 13.0 - 17.0 g/dL   HCT 33.8 (L) 39.0 - 52.0 %   MCV 86.0 78.0 - 100.0 fL   MCH 28.2 26.0 - 34.0  pg   MCHC 32.8 30.0 - 36.0 g/dL   RDW 17.6 (H) 11.5 - 15.5 %   Platelets 397 150 - 400 K/uL  Glucose, capillary     Status: Abnormal   Collection Time: 10/19/15  6:34 AM  Result Value Ref Range   Glucose-Capillary 108 (H) 65 - 99 mg/dL  Heparin level (unfractionated)     Status: Abnormal   Collection Time: 10/19/15 10:27 AM  Result Value Ref Range   Heparin Unfractionated 0.21 (L) 0.30 - 0.70 IU/mL    Comment:        IF HEPARIN RESULTS ARE BELOW EXPECTED VALUES, AND PATIENT DOSAGE HAS BEEN CONFIRMED, SUGGEST FOLLOW UP TESTING OF ANTITHROMBIN III LEVELS.   Glucose, capillary     Status: Abnormal   Collection Time: 10/19/15 12:09 PM  Result Value Ref Range   Glucose-Capillary 254 (H) 65 - 99 mg/dL  Glucose, capillary     Status: Abnormal   Collection Time: 10/19/15  4:37 PM  Result Value Ref Range   Glucose-Capillary 138 (H) 65 - 99 mg/dL  Heparin level (unfractionated)     Status: Abnormal   Collection Time: 10/19/15  7:23 PM  Result Value Ref Range   Heparin Unfractionated 0.10 (L) 0.30 - 0.70 IU/mL    Comment:        IF HEPARIN RESULTS ARE BELOW EXPECTED VALUES, AND PATIENT DOSAGE HAS BEEN CONFIRMED, SUGGEST FOLLOW UP TESTING OF ANTITHROMBIN III LEVELS.   Glucose, capillary     Status: Abnormal   Collection Time: 10/19/15  8:55 PM  Result Value Ref Range   Glucose-Capillary 205 (H) 65 - 99 mg/dL  Glucose, capillary     Status: None   Collection Time: 10/20/15  6:46 AM  Result Value Ref Range   Glucose-Capillary 94 65 - 99 mg/dL    General: No acute distress. Vital signs reviewed Psych: Mood and affect are appropriate Heart: Regular rate and rhythm no rubs murmurs or extra sounds Lungs: Clear to auscultation, breathing unlabored, no rales or wheezes Abdomen: Positive bowel sounds, soft nontender to palpation, nondistended Skin: No evidence of breakdown, no evidence  of rash Neurologic:  Motor  RUE/RLE: 5/5 proximal distal LUE: 4 -/5 proximal distal LLE: hip  flexion, knee extension 4/5, 3-/5 ankle dorsi/plantar flexion Sensory examDifficult to assess secondary to a aphasia, does feel pinch in all 4 limbs Musculoskeletal: No TTP. No joint swelling. No edema  Assessment/Plan: 1. 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 Physiatrist is providing close team supervision and 24 hour management of active medical problems listed below. Physiatrist and rehab team continue to assess barriers to discharge/monitor patient progress toward functional and medical goals. FIM: Function - Bathing Position: Wheelchair/chair at sink Body parts bathed by patient: Right arm, Chest, Abdomen (UB only this session) Body parts bathed by helper: Left arm, Back Bathing not applicable: Front perineal area, Buttocks, Right upper leg, Left upper leg, Right lower leg, Left lower leg Assist Level:  (min A)  Function- Upper Body Dressing/Undressing What is the patient wearing?: Pull over shirt/dress Pull over shirt/dress - Perfomed by patient: Put head through opening, Pull shirt over trunk Pull over shirt/dress - Perfomed by helper: Thread/unthread left sleeve, Thread/unthread right sleeve Assist Level:  (mod A) Function - Lower Body Dressing/Undressing What is the patient wearing?: Pants, Non-skid slipper socks Position: Wheelchair/chair at sink Pants- Performed by patient: Thread/unthread left pants leg Pants- Performed by helper: Thread/unthread right pants leg, Pull pants up/down Non-skid slipper socks- Performed by helper: Don/doff right sock, Don/doff left sock Assist for footwear: Dependant Assist for lower body dressing: Touching or steadying assistance (Pt > 75%)        Function - Chair/bed transfer Chair/bed transfer method: Stand pivot Chair/bed transfer assist level: Moderate assist  (Pt 50 - 74%/lift or lower) Chair/bed transfer assistive device: Bedrails Chair/bed transfer details: Verbal cues for precautions/safety, Manual facilitation for weight shifting  Function - Locomotion: Wheelchair Will patient use wheelchair at discharge?: No Type: Manual Max wheelchair distance: 20 Assist Level: Touching or steadying assistance (Pt > 75%) Wheel 50 feet with 2 turns activity did not occur: Safety/medical concerns Assist Level: Moderate assistance (Pt 50 - 74%) Wheel 150 feet activity did not occur: Safety/medical concerns Turns around,maneuvers to table,bed, and toilet,negotiates 3% grade,maneuvers on rugs and over doorsills: No Function - Locomotion: Ambulation Ambulation activity did not occur: Safety/medical concerns Assistive device: Walker-rolling Max distance: 50 Assist level: 2 helpers Assist level: Moderate assist (Pt 50 - 74%) Assist level: Moderate assist (Pt 50 - 74%) Walk 150 feet activity did not occur: Safety/medical concerns Walk 10 feet on uneven surfaces activity did not occur: Safety/medical concerns  Function - Comprehension Comprehension: Auditory Comprehension assist level: Understands basic 75 - 89% of the time/ requires cueing 10 - 24% of the time  Function - Expression Expression: Verbal Expression assist level: Expresses basic needs/ideas: With extra time/assistive device  Function - Social Interaction Social Interaction assist level: Interacts appropriately 90% of the time - Needs monitoring or encouragement for participation or interaction.  Function - Problem Solving Problem solving assist level: Solves basic 75 - 89% of the time/requires cueing 10 - 24% of the time  Function - Memory Memory assist level: Recognizes or recalls 50 - 74% of the time/requires cueing 25 - 49% of the time Patient normally able to recall (first 3 days only): That he or she is in a hospital, Staff names and faces  Medical Problem List and Plan: 1. Right  hemiparesis and aphasia secondary to bi-cerebral  embolic infarcts (L>R)- ask neuro eval once imaging from [place] available   2. PE/DVT/Stoke/Anticoagulation: Pharmaceutical: Lovenox bid per records from [place].  use iv heparin gtt at this time. per hematologist Dr Alen Blew ?warfarin 3. Pain Management: Ultram prn.  4. Mood: LCSW to follow for evaluation and support.  5. Neuropsych: This patient is not fully capable of making decisions on his own behalf. 6. Skin/Wound Care: Routine pressure relief measures.  7. Fluids/Electrolytes/Nutrition: Renal restrictions. 1200 cc/FR.  8. DM type 2: Monitor BS ac/hs. Continue levemir and use SSI for elevated BS.   Fasting CBG 94 this AM, however labile readings  continue current dose Levemir  Will continue to monitor and consider adding scheduled mealtime coverage 9. HTN: Monitor BP bid and titrate medications as needed. Elevated at admission. Parameters on Midodrine.  10. ESRD on HD: per [facility]. Continue diet/volume mgt, Tunnel dialysis catheter Placed. Appreciate nephrology recs 11. Leukocytosis: no breathing difficulties or coughing.UA negative except for elevated protein as expected from renal failure, WBC stable 12.  Hx of GI bleed will monitor for reurrence,Hgb stable at 11.1 on 2/10 13. Dysphagia- Per speech therapy, may be able to upgrade solids to D2  LOS (Days) 8 A FACE TO FACE EVALUATION WAS PERFORMED  Ankit Lorie Phenix 10/20/2015, 10:07 AM

## 2015-10-20 NOTE — Progress Notes (Signed)
Speech Language Pathology Daily Session Note  Patient Details  Name: Isaiah [name] [facility] MRN: [id number] Date of Birth: [birth year]  Today's Date: 10/20/2015 SLP Individual Time: 1000-1030 SLP Individual Time Calculation (min): 30 min  Short Term Goals: Week 2: SLP Short Term Goal 1 (Week 2): Pt will consume dys 2 textures and thin liquids with minimal overt s/s of aspiration and supervision for use of swallowing precautions over 2 targeted sessions prior to advancement.   SLP Short Term Goal 2 (Week 2): Pt will self-monitor and correct verbal perseveration with mod assist multimodal cues. SLP Short Term Goal 3 (Week 2): Pt will name familiar objects with min assist semantic cues.  SLP Short Term Goal 4 (Week 2): Pt will utilize over articulation for production of phonemes at the CV, CVC/word level for accurate production of vowels and bilabials with Min verbal, visual cues  SLP Short Term Goal 5 (Week 2): Pt will verbalize at the phrase level with mod assist verbal cues for 75% accuracy  Skilled Therapeutic Interventions: Skilled treatment session focused on dysphagia goals. SLP facilitated session by providing supervision verbal cues for patient to self-monitor and correct buccal pocketing/oral residue with trials of Dys. 2 textures. Patient demonstrated efficient mastication with minimal residue in which he cleared with liquid washes. No overt s/s of aspiration were noted. Recommend trial tray prior to upgrade. Patient performed oral care via the suction toothbrush with set-up assist at end of session. Patient left upright in bed with all needs within reach. Continue with current plan of care.    Function:  Eating Eating   Modified Consistency Diet: Yes Eating Assist Level: Supervision or verbal cues;Set up assist for   Eating Set Up Assist For: Opening containers       Cognition Comprehension Comprehension assist level: Understands basic 75 - 89% of the time/ requires cueing 10 - 24% of  the time  Expression   Expression assist level: Expresses basic 50 - 74% of the time/requires cueing 25 - 49% of the time. Needs to repeat parts of sentences.  Social Interaction Social Interaction assist level: Interacts appropriately 90% of the time - Needs monitoring or encouragement for participation or interaction.  Problem Solving Problem solving assist level: Solves basic 75 - 89% of the time/requires cueing 10 - 24% of the time  Memory Memory assist level: Recognizes or recalls 50 - 74% of the time/requires cueing 25 - 49% of the time    Pain Pain Assessment Pain Assessment: No/denies pain   Therapy/Group: Individual Therapy  Tina Gruner 10/20/2015, 1:43 PM

## 2015-10-20 NOTE — Progress Notes (Signed)
S: No new CO.  Working with speech therapy O:BP 121/77 mmHg  Pulse 95  Temp(Src) 98 F (36.7 C) (Oral)  Resp 18  Ht 5' 11"  (1.803 m)  Wt 90.4 kg (199 lb 4.7 oz)  BMI 27.81 kg/m2  SpO2 99%  Intake/Output Summary (Last 24 hours) at 10/20/15 1028 Last data filed at 10/20/15 0806  Gross per 24 hour  Intake    360 ml  Output      0 ml  Net    360 ml   Weight change: -1.4 kg (-3 lb 1.4 oz) PFY:TWKMQ and alert.  CVS:RRR Resp:clear Abd:+ BS NTND Ext:No edema NEURO: Awake and alert, aphasic Rt permcath   . antiseptic oral rinse  7 mL Mouth Rinse BID  . aspirin EC  81 mg Oral Daily  . darbepoetin (ARANESP) injection - DIALYSIS  100 mcg Intravenous Q Tue-HD  . Fluticasone Furoate-Vilanterol  1 puff Inhalation Daily  . insulin aspart  0-5 Units Subcutaneous QHS  . insulin aspart  0-9 Units Subcutaneous TID WC  . insulin detemir  20 Units Subcutaneous QHS  . magic mouthwash w/lidocaine  5 mL Oral QID  . midodrine  5 mg Oral TID WC  . montelukast  10 mg Oral QHS  . multivitamin  1 tablet Oral QHS  . pantoprazole  40 mg Oral BID   No results found. BMET    Component Value Date/Time   NA 132* 10/18/2015 1620   NA 138 09/06/2015 1306   K 4.4 10/18/2015 1620   K 4.8 09/06/2015 1306   CL 98* 10/18/2015 1620   CO2 23 10/18/2015 1620   CO2 15* 09/06/2015 1306   GLUCOSE 306* 10/18/2015 1620   GLUCOSE 98 09/06/2015 1306   BUN 30* 10/18/2015 1620   BUN 54.0* 09/06/2015 1306   CREATININE 5.16* 10/18/2015 1620   CREATININE 5.8* 09/06/2015 1306   CALCIUM 9.0 10/18/2015 1620   CALCIUM 7.6* 09/06/2015 1306   GFRNONAA 12* 10/18/2015 1620   GFRAA 14* 10/18/2015 1620   CBC    Component Value Date/Time   WBC 15.4* 10/19/2015 0600   WBC 17.1* 09/06/2015 1306   RBC 3.93* 10/19/2015 0600   RBC 4.26 09/06/2015 1306   RBC 3.07* 09/29/2014 1810   HGB 11.1* 10/19/2015 0600   HGB 11.3* 09/06/2015 1306   HCT 33.8* 10/19/2015 0600   HCT 32.8* 09/06/2015 1306   PLT 397 10/19/2015 0600    PLT 322 09/06/2015 1306   MCV 86.0 10/19/2015 0600   MCV 77.0* 09/06/2015 1306   MCH 28.2 10/19/2015 0600   MCH 26.5* 09/06/2015 1306   MCHC 32.8 10/19/2015 0600   MCHC 34.5 09/06/2015 1306   RDW 17.6* 10/19/2015 0600   RDW 19.1* 09/06/2015 1306   LYMPHSABS 1.6 09/06/2015 1306   LYMPHSABS 4.5* 10/04/2012 1340   MONOABS 1.1* 09/06/2015 1306   MONOABS 1.7* 10/04/2012 1340   EOSABS 0.3 09/06/2015 1306   EOSABS 0.8* 10/04/2012 1340   BASOSABS 0.1 09/06/2015 1306   BASOSABS 0.2* 10/04/2012 1340     Assessment:  1. New ESRD   2. Embolic CVA 3. Anemia on aranesp 4. Sec HPTH  PTH 211 so not on Vit D 5. Hypotension on midodrine  Plan: 1. HD today 2. Plan to go to [facility] after DC.  TTS 2nd shift.  Sayaka Hoeppner T

## 2015-10-21 ENCOUNTER — Inpatient Hospital Stay (HOSPITAL_COMMUNITY): Payer: Self-pay | Admitting: Physical Therapy

## 2015-10-21 DIAGNOSIS — D72829 Elevated white blood cell count, unspecified: Secondary | ICD-10-CM

## 2015-10-21 LAB — CBC
HCT: 32.5 % — ABNORMAL LOW (ref 39.0–52.0)
Hemoglobin: 10.7 g/dL — ABNORMAL LOW (ref 13.0–17.0)
MCH: 28.7 pg (ref 26.0–34.0)
MCHC: 32.9 g/dL (ref 30.0–36.0)
MCV: 87.1 fL (ref 78.0–100.0)
Platelets: 482 10*3/uL — ABNORMAL HIGH (ref 150–400)
RBC: 3.73 MIL/uL — ABNORMAL LOW (ref 4.22–5.81)
RDW: 17.5 % — ABNORMAL HIGH (ref 11.5–15.5)
WBC: 17.3 10*3/uL — ABNORMAL HIGH (ref 4.0–10.5)

## 2015-10-21 LAB — GLUCOSE, CAPILLARY
Glucose-Capillary: 91 mg/dL (ref 65–99)
Glucose-Capillary: 207 mg/dL — ABNORMAL HIGH (ref 65–99)
Glucose-Capillary: 170 mg/dL — ABNORMAL HIGH (ref 65–99)
Glucose-Capillary: 243 mg/dL — ABNORMAL HIGH (ref 65–99)

## 2015-10-21 LAB — HEPARIN LEVEL (UNFRACTIONATED)
Heparin Unfractionated: 0.17 IU/mL — ABNORMAL LOW (ref 0.30–0.70)
Heparin Unfractionated: 0.31 IU/mL (ref 0.30–0.70)
Heparin Unfractionated: 0.37 IU/mL (ref 0.30–0.70)

## 2015-10-21 MED ORDER — MIDODRINE HCL 5 MG PO TABS
5.0000 mg | ORAL_TABLET | Freq: Two times a day (BID) | ORAL | Status: DC
Start: 2015-10-21 — End: 2015-10-22
  Administered 2015-10-21 – 2015-10-22 (×2): 5 mg via ORAL
  Filled 2015-10-21 (×4): qty 1

## 2015-10-21 NOTE — Progress Notes (Signed)
S: Tolerated HD well yest O:BP 115/78 mmHg  Pulse 97  Temp(Src) 99.3 F (37.4 C) (Oral)  Resp 18  Ht 5' 11"  (1.803 m)  Wt 91.6 kg (201 lb 15.1 oz)  BMI 28.18 kg/m2  SpO2 99%  Intake/Output Summary (Last 24 hours) at 10/21/15 1006 Last data filed at 10/21/15 0754  Gross per 24 hour  Intake    360 ml  Output   2000 ml  Net  -1640 ml   Weight change: 3.2 kg (7 lb 0.9 oz) QMG:NOIBB and alert.  CVS:RRR Resp:clear Abd:+ BS NTND Ext:No edema NEURO: Awake and alert, aphasic Rt permcath   . antiseptic oral rinse  7 mL Mouth Rinse BID  . aspirin EC  81 mg Oral Daily  . darbepoetin (ARANESP) injection - DIALYSIS  100 mcg Intravenous Q Tue-HD  . Fluticasone Furoate-Vilanterol  1 puff Inhalation Daily  . insulin aspart  0-5 Units Subcutaneous QHS  . insulin aspart  0-9 Units Subcutaneous TID WC  . insulin detemir  20 Units Subcutaneous QHS  . magic mouthwash w/lidocaine  5 mL Oral QID  . midodrine  5 mg Oral TID WC  . montelukast  10 mg Oral QHS  . multivitamin  1 tablet Oral QHS  . pantoprazole  40 mg Oral BID   No results found. BMET    Component Value Date/Time   NA 133* 10/20/2015 1243   NA 138 09/06/2015 1306   K 3.9 10/20/2015 1243   K 4.8 09/06/2015 1306   CL 97* 10/20/2015 1243   CO2 26 10/20/2015 1243   CO2 15* 09/06/2015 1306   GLUCOSE 175* 10/20/2015 1243   GLUCOSE 98 09/06/2015 1306   BUN 18 10/20/2015 1243   BUN 54.0* 09/06/2015 1306   CREATININE 5.07* 10/20/2015 1243   CREATININE 5.8* 09/06/2015 1306   CALCIUM 8.9 10/20/2015 1243   CALCIUM 7.6* 09/06/2015 1306   GFRNONAA 12* 10/20/2015 1243   GFRAA 14* 10/20/2015 1243   CBC    Component Value Date/Time   WBC 17.3* 10/21/2015 0757   WBC 17.1* 09/06/2015 1306   RBC 3.73* 10/21/2015 0757   RBC 4.26 09/06/2015 1306   RBC 3.07* 09/29/2014 1810   HGB 10.7* 10/21/2015 0757   HGB 11.3* 09/06/2015 1306   HCT 32.5* 10/21/2015 0757   HCT 32.8* 09/06/2015 1306   PLT 482* 10/21/2015 0757   PLT 322  09/06/2015 1306   MCV 87.1 10/21/2015 0757   MCV 77.0* 09/06/2015 1306   MCH 28.7 10/21/2015 0757   MCH 26.5* 09/06/2015 1306   MCHC 32.9 10/21/2015 0757   MCHC 34.5 09/06/2015 1306   RDW 17.5* 10/21/2015 0757   RDW 19.1* 09/06/2015 1306   LYMPHSABS 1.6 09/06/2015 1306   LYMPHSABS 4.5* 10/04/2012 1340   MONOABS 1.1* 09/06/2015 1306   MONOABS 1.7* 10/04/2012 1340   EOSABS 0.3 09/06/2015 1306   EOSABS 0.8* 10/04/2012 1340   BASOSABS 0.1 09/06/2015 1306   BASOSABS 0.2* 10/04/2012 1340     Assessment:  1. New ESRD   2. Embolic CVA 3. Anemia on aranesp 4. Sec HPTH  PTH 211 so not on Vit D 5. Hypotension on midodrine 6. Hx MGUS  Plan: 1. HD tuesday 2. Plan to go to [facility] after DC.  TTS 2nd shift. 3. ? If still needs midodrine, will decrease to BID  Minnah Llamas T

## 2015-10-21 NOTE — Progress Notes (Signed)
Rested without complaint of. Wore CPAP until 0500. Heparin increased  To 51m/hr at 0030, per orders. MPatrici RanksA

## 2015-10-21 NOTE — Progress Notes (Signed)
ANTICOAGULATION CONSULT NOTE - Follow Up Consult  Pharmacy Consult for heparin Indication: PE/DVT/stroke  Allergies  Allergen Reactions  . Codeine Other (See Comments)    Pt unknown  . Codeine Rash and Other (See Comments)    Unknown reaction (patient says it was more serious than just a rash, but he can't remember what happened)    Patient Measurements: Height: 5' 11"  (180.3 cm) Weight: 201 lb 15.1 oz (91.6 kg) IBW/kg (Calculated) : 75.3 Heparin Dosing Weight: 95kg  Vital Signs: Temp: 99.3 F (37.4 C) (02/12 0646) Temp Source: Oral (02/12 0646) BP: 115/78 mmHg (02/12 0646) Pulse Rate: 97 (02/12 0646)  Labs:  Recent Labs  10/18/15 1620  10/20/15 1233 10/20/15 1243 10/20/15 2347 10/21/15 0757  HGB  --   < > 10.9* 11.0*  --  10.7*  HCT  --   < > 32.5* 32.8*  --  32.5*  PLT  --   < > 485* 476*  --  482*  HEPARINUNFRC  --   < > 0.20*  --  0.17* 0.37  CREATININE 5.16*  --   --  5.07*  --   --   < > = values in this interval not displayed.  Estimated Creatinine Clearance: 19.9 mL/min (by C-G formula based on Cr of 5.07).  Assessment: 50 yo male with hx PE/DVT/stroke is currently on subtherapeutic heparin.   HL 0.37 therapeutic on heparin 1800 units/h. CBC stable, no noted bleeding.    Goal of Therapy:  Heparin level 0.3-0.5 units/ml Monitor platelets by anticoagulation protocol: Yes   Plan:  Continue heparin 1800 units/h 1630 confirmatory HL Daily HL, CBC Monitor for s/sx of bleeding    Heloise Ochoa, Pharm.D., BCPS PGY2 Cardiology Pharmacy Resident Pager: [phone]  10/21/2015 9:38 AM

## 2015-10-21 NOTE — Progress Notes (Signed)
ANTICOAGULATION CONSULT NOTE - Follow Up Consult  Pharmacy Consult for heparin Indication: PE/DVT/stroke  Allergies  Allergen Reactions  . Codeine Other (See Comments)    Pt unknown  . Codeine Rash and Other (See Comments)    Unknown reaction (patient says it was more serious than just a rash, but he can't remember what happened)    Patient Measurements: Height: 5' 11"  (180.3 cm) Weight: 201 lb 15.1 oz (91.6 kg) IBW/kg (Calculated) : 75.3 Heparin Dosing Weight: 95kg  Vital Signs: Temp: 98.6 F (37 C) (02/12 1325) Temp Source: Oral (02/12 1325) BP: 130/80 mmHg (02/12 1325) Pulse Rate: 103 (02/12 1325)  Labs:  Recent Labs  10/20/15 1233 10/20/15 1243 10/20/15 2347 10/21/15 0757 10/21/15 1638  HGB 10.9* 11.0*  --  10.7*  --   HCT 32.5* 32.8*  --  32.5*  --   PLT 485* 476*  --  482*  --   HEPARINUNFRC 0.20*  --  0.17* 0.37 0.31  CREATININE  --  5.07*  --   --   --     Estimated Creatinine Clearance: 19.9 mL/min (by C-G formula based on Cr of 5.07).  Assessment: 50 yo male with hx PE/DVT/stroke is currently on subtherapeutic heparin.   HL 0.37 therapeutic on heparin 1800 units/h. CBC stable, no noted bleeding.   Repeat HL 0.31 in goal.  Goal of Therapy:  Heparin level 0.3-0.5 units/ml Monitor platelets by anticoagulation protocol: Yes   Plan:  Continue heparin 1800 units/h Daily HL, CBC Monitor for s/sx of bleeding    Sophiarose Eades S. Alford Highland, PharmD, [facility] Clinical Staff Pharmacist Pager [phone]   10/21/2015 5:35 PM

## 2015-10-21 NOTE — Progress Notes (Signed)
Pt. Has his own home cpap, places it on himself.

## 2015-10-21 NOTE — Progress Notes (Signed)
Social Work Assessment and Plan  Patient Details  Name: McCullom Lake MRN: [id number] Date of Birth: 02/23/1966  Today's Date: 10/21/2015  Problem List:  Patient Active Problem List   Diagnosis Date Noted  . Leukocytosis   . Labile blood glucose   . Acute blood loss anemia   . Hemiparesis, aphasia, and dysphagia as late effects of cerebrovascular accident ([place]) 10/13/2015  . Gait disturbance, post-stroke 10/13/2015  . Embolic stroke ([place]) 64/40/3474  . Plasma cell disorder   . Anemia 10/31/2014  . Long term current use of anticoagulant therapy 10/31/2014  . Renal insufficiency 10/31/2014  . Iron deficiency anemia due to chronic blood loss 10/30/2014  . Encounter for therapeutic drug monitoring 10/11/2014  . Pulmonary embolus ([place]) 09/27/2014  . ESRD on dialysis ([place]) 09/27/2014  . MGUS (monoclonal gammopathy of unknown significance) 09/27/2014  . OSA on CPAP 09/27/2014  . Diabetic retinopathy of both eyes ([place]) 02/03/2012  . Sickle cell trait ([place]) 07/19/2011  . Morbid obesity ([place]) 07/19/2011  . PE (pulmonary embolism) 07/18/2011  . SOB (shortness of breath) 07/18/2011  . Hyperkalemia 07/18/2011  . ARF (acute renal failure) ([place]) 07/18/2011  . Leukocytosis, unspecified 07/18/2011  . DM (diabetes mellitus) type II controlled with renal manifestation ([place]) 07/18/2011  . Cardiac enzymes elevated 07/18/2011  . Metabolic acidosis 25/95/6387  . HTN (hypertension) 07/18/2011   Past Medical History:  Past Medical History  Diagnosis Date  . Diabetes mellitus without complication ([place])   . Sickle cell-thalassemia disease ([place])     a. Sickle cell trait.  . Pulmonary embolism ([place]) 07/2011    a. Tx with Coumadin for 6 months (unknown cause per patient).  . Hyperkalemia 07/2011  . Acute renal failure ([place]) 07/2011  . Monoclonal gammopathy   . CKD (chronic kidney disease)   . Sleep apnea     a. uses CPAP.  [name] Hypertension   . Sickle cell-thalassemia disease ([place])   . Gout    . Pulmonary embolism ([place]) 07/2011; 09/27/2014    a. Bilat PE 07/2011 - unclear cause, tx with 6 months Coumadin.;   . Monoclonal gammopathies   . Anemia   . Type II diabetes mellitus ([place])   . Asthma   . OSA on CPAP   . Sickle-cell thalassemia ([place])   . CKD (chronic kidney disease), stage IV ([place])   . History of recent blood transfusion 10/27/14    2 Units PRBC's   Past Surgical History:  Past Surgical History  Procedure Laterality Date  . Cholecystectomy    . Bone marrow biopsy    . Other surgical history      Retinal surgery  . Pars plana vitrectomy  02/17/2012    Procedure: PARS PLANA VITRECTOMY WITH 25 GAUGE;  Surgeon: Hayden Pedro, MD;  Location: [facility];  Service: Ophthalmology;  Laterality: Right;  . Cholecystectomy  1990's?  . Eye surgery Right   . Insertion of dialysis catheter Right 10/16/2015    Procedure: INSERTION OF PALINDROME DIALYSIS CATHETER ;  Surgeon: Elam Dutch, MD;  Location: [facility];  Service: Vascular;  Laterality: Right;   Social History:  reports that he has never smoked. He has never used smokeless tobacco. He reports that he does not drink alcohol or use illicit drugs.  Family / Support Systems Marital Status: Married How Long?: 10 years Patient Roles: Spouse, Parent, Other (Comment) (son) Spouse/Significant Other: Yovanni Frenette - wife - [phone] (h); [phone] (m) Children: one stepson - high school senior Other  Supports: employer and co-workers Anticipated Caregiver: wife and pt's Mom, Pamala Hurry from [place] Ability/Limitations of Caregiver: wife works fulltime as Advertising account executive at [employer] for [employer] Caregiver Availability: 24/7 Family Dynamics: close, supportive family  Social History Preferred language: English Religion: Baptist Education: master's degree Read: Yes Write: Yes Employment Status: Employed 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 Legal History/Current Legal Issues: none reported Guardian/Conservator: not at this time.  Wife is making decisions and helping with affairs at work, etc.   Abuse/Neglect Physical Abuse: Denies Verbal Abuse: Denies Sexual Abuse: Denies Exploitation of patient/patient's resources: Denies Self-Neglect: Denies  Emotional Status Pt's affect, behavior and adjustment status: Pt is aphasic, so it is difficult to know how he is adjusting.  He seems to be in fair spirits and makes attempts to communicate with CSW at each visit. Recent Psychosocial Issues: Pt has only been at his new job since March 2016 in [place], [place].  He is not eligible for FMLA until this March.  Co-workers are trying to donate time off to get pt to March so he can apply for [facility] Psychiatric History: None reported Substance Abuse History: None reported  Patient / Family Perceptions, Expectations & Goals Pt/Family understanding of illness & functional limitations: Pt's wife talked with PA, Reesa Chew, and now has a better understanding of pt's condition. Premorbid pt/family roles/activities: Pt likes sports and was working hard at his new job. Anticipated changes in roles/activities/participation: Pt's deficits and dialysis schedule may keep him from returning to work full-time. Pt/family expectations/goals: Pt/family would like for pt to be able to return to work.  Community Resources [employer] Scripts: None Premorbid Home Care/DME Agencies: None Transportation available at discharge: family Resource referrals recommended: Neuropsychology, Support group (specify) (when appropriate/pt's communication improves)  Discharge Planning Living Arrangements: Alone Support Systems: Spouse/significant other, Children, Parent, Friends/neighbors Type of Residence: Private  residence Insurance Resources: Multimedia programmer (specify) ([employer]) Pensions consultant: Employment, Secondary school teacher Screen Referred: No Living Expenses: Education officer, community Management: Patient, Spouse Does the patient have any problems obtaining your medications?: No Home Management: Pt was managing his own apartment in [place]. Patient/Family Preliminary Plans: Pt will return to his wife's home in [place].  His mother will stay with them for awhile so that wife can work. Barriers to Discharge: Steps Social Work Anticipated Follow Up Needs: HH/OP, Support Group Expected length of stay: 3 weeks  Clinical Impression CSW met with pt briefly, but it was difficult for him to communicate.  CSW later spoke with pt's wife and met pt's mother.  Pt was working in [place] since last March and he would like to return to his job, if he can.  Pt's wife works at [employer] and she plans to stay in [place] for work and family, even if pt can return to work in [employer].  Pt seems to be adjusting okay, but it is difficult to say for sure with pt's aphasia.  CSW will continue to follow and assess pt over his stay on CIR.  Pt's mother is here from [facility] and she plans to stay with pt when he d/c'd so that pt's wife can continue to work.  CSW has assisted pt/wife with a letter from the doctor and completed medical forms to assist pt/wife with  pt getting assistance on deferment of student loans, receiving donated time off from co-workers, and for [employer].  CSW will continue to assist with other paperwork as needed.  Pt did not elect short term disability through his employer.  If pt is expected to be disabled for a year, CSW can assist with social security application.  Also, pt has ESRD now, so pt will eventually be eligible for Medicare.  CSW will continue to follow pt and assist as needed.  Shamila Lerch, Silvestre Mesi 10/21/2015, 11:09 PM

## 2015-10-21 NOTE — Progress Notes (Signed)
ANTICOAGULATION CONSULT NOTE - Follow Up Consult  Pharmacy Consult for heparin Indication: pulmonary embolus, DVT and stroke   Labs:  Recent Labs  10/18/15 1620  10/19/15 0600  10/19/15 1923 10/20/15 1233 10/20/15 1243 10/20/15 2347  HGB  --   --  11.1*  --   --  10.9* 11.0*  --   HCT  --   --  33.8*  --   --  32.5* 32.8*  --   PLT  --   --  397  --   --  485* 476*  --   HEPARINUNFRC  --   < >  --   < > 0.10* 0.20*  --  0.17*  CREATININE 5.16*  --   --   --   --   --  5.07*  --   < > = values in this interval not displayed.    Assessment: 50yo male now with lower heparin level despite rate increase.  Goal of Therapy:  Heparin level 0.3-0.5 units/ml   Plan:  Will increase heparin gtt by 2-3 units/kg/hr to 1800 units/hr and check level in [place], PharmD, BCPS  10/21/2015,12:28 AM

## 2015-10-21 NOTE — Progress Notes (Addendum)
Subjective/Complaints: Patient lying in bed this morning. In nursing note that he wore his CPAP for the majority of the night. Per nursing unable to obtain blood draw from upper extremity.  ROS- appears to deny CP, SOB, N/V/D  Objective: Vital Signs: Blood pressure 120/82, pulse 106, temperature 99.2 F (37.3 C), temperature source Oral, resp. rate 18, height 5' 11"  (1.803 m), weight 91.6 kg (201 lb 15.1 oz), SpO2 99 %. No results found. Results for orders placed or performed during the hospital encounter of 10/12/15 (from the past 72 hour(s))  CBC     Status: Abnormal   Collection Time: 10/18/15  8:14 AM  Result Value Ref Range   WBC 15.2 (H) 4.0 - 10.5 K/uL   RBC 3.64 (L) 4.22 - 5.81 MIL/uL   Hemoglobin 10.6 (L) 13.0 - 17.0 g/dL   HCT 31.4 (L) 39.0 - 52.0 %   MCV 86.3 78.0 - 100.0 fL   MCH 29.1 26.0 - 34.0 pg   MCHC 33.8 30.0 - 36.0 g/dL   RDW 17.8 (H) 11.5 - 15.5 %   Platelets 371 150 - 400 K/uL  Heparin level (unfractionated)     Status: Abnormal   Collection Time: 10/18/15  8:14 AM  Result Value Ref Range   Heparin Unfractionated 0.17 (L) 0.30 - 0.70 IU/mL    Comment:        IF HEPARIN RESULTS ARE BELOW EXPECTED VALUES, AND PATIENT DOSAGE HAS BEEN CONFIRMED, SUGGEST FOLLOW UP TESTING OF ANTITHROMBIN III LEVELS.   Glucose, capillary     Status: Abnormal   Collection Time: 10/18/15  8:51 AM  Result Value Ref Range   Glucose-Capillary 153 (H) 65 - 99 mg/dL   Comment 1 Notify RN   Glucose, capillary     Status: Abnormal   Collection Time: 10/18/15 11:22 AM  Result Value Ref Range   Glucose-Capillary 262 (H) 65 - 99 mg/dL   Comment 1 Notify RN   Renal function panel     Status: Abnormal   Collection Time: 10/18/15  4:20 PM  Result Value Ref Range   Sodium 132 (L) 135 - 145 mmol/L   Potassium 4.4 3.5 - 5.1 mmol/L   Chloride 98 (L) 101 - 111 mmol/L   CO2 23 22 - 32 mmol/L   Glucose, Bld 306 (H) 65 - 99 mg/dL   BUN 30 (H) 6 - 20 mg/dL   Creatinine, Ser 5.16 (H) 0.61  - 1.24 mg/dL   Calcium 9.0 8.9 - 10.3 mg/dL   Phosphorus 3.3 2.5 - 4.6 mg/dL   Albumin 2.8 (L) 3.5 - 5.0 g/dL   GFR calc non Af Amer 12 (L) >60 mL/min   GFR calc Af Amer 14 (L) >60 mL/min    Comment: (NOTE) The eGFR has been calculated using the CKD EPI equation. This calculation has not been validated in all clinical situations. eGFR's persistently <60 mL/min signify possible Chronic Kidney Disease.    Anion gap 11 5 - 15  Heparin level (unfractionated)     Status: Abnormal   Collection Time: 10/18/15  5:46 PM  Result Value Ref Range   Heparin Unfractionated 0.25 (L) 0.30 - 0.70 IU/mL    Comment:        IF HEPARIN RESULTS ARE BELOW EXPECTED VALUES, AND PATIENT DOSAGE HAS BEEN CONFIRMED, SUGGEST FOLLOW UP TESTING OF ANTITHROMBIN III LEVELS.   Glucose, capillary     Status: Abnormal   Collection Time: 10/18/15  9:43 PM  Result Value Ref Range   Glucose-Capillary  142 (H) 65 - 99 mg/dL  Heparin level (unfractionated)     Status: None   Collection Time: 10/19/15 12:33 AM  Result Value Ref Range   Heparin Unfractionated 0.63 0.30 - 0.70 IU/mL    Comment:        IF HEPARIN RESULTS ARE BELOW EXPECTED VALUES, AND PATIENT DOSAGE HAS BEEN CONFIRMED, SUGGEST FOLLOW UP TESTING OF ANTITHROMBIN III LEVELS.   CBC     Status: Abnormal   Collection Time: 10/19/15  6:00 AM  Result Value Ref Range   WBC 15.4 (H) 4.0 - 10.5 K/uL   RBC 3.93 (L) 4.22 - 5.81 MIL/uL   Hemoglobin 11.1 (L) 13.0 - 17.0 g/dL   HCT 33.8 (L) 39.0 - 52.0 %   MCV 86.0 78.0 - 100.0 fL   MCH 28.2 26.0 - 34.0 pg   MCHC 32.8 30.0 - 36.0 g/dL   RDW 17.6 (H) 11.5 - 15.5 %   Platelets 397 150 - 400 K/uL  Glucose, capillary     Status: Abnormal   Collection Time: 10/19/15  6:34 AM  Result Value Ref Range   Glucose-Capillary 108 (H) 65 - 99 mg/dL  Heparin level (unfractionated)     Status: Abnormal   Collection Time: 10/19/15 10:27 AM  Result Value Ref Range   Heparin Unfractionated 0.21 (L) 0.30 - 0.70 IU/mL     Comment:        IF HEPARIN RESULTS ARE BELOW EXPECTED VALUES, AND PATIENT DOSAGE HAS BEEN CONFIRMED, SUGGEST FOLLOW UP TESTING OF ANTITHROMBIN III LEVELS.   Glucose, capillary     Status: Abnormal   Collection Time: 10/19/15 12:09 PM  Result Value Ref Range   Glucose-Capillary 254 (H) 65 - 99 mg/dL  Glucose, capillary     Status: Abnormal   Collection Time: 10/19/15  4:37 PM  Result Value Ref Range   Glucose-Capillary 138 (H) 65 - 99 mg/dL  Heparin level (unfractionated)     Status: Abnormal   Collection Time: 10/19/15  7:23 PM  Result Value Ref Range   Heparin Unfractionated 0.10 (L) 0.30 - 0.70 IU/mL    Comment:        IF HEPARIN RESULTS ARE BELOW EXPECTED VALUES, AND PATIENT DOSAGE HAS BEEN CONFIRMED, SUGGEST FOLLOW UP TESTING OF ANTITHROMBIN III LEVELS.   Glucose, capillary     Status: Abnormal   Collection Time: 10/19/15  8:55 PM  Result Value Ref Range   Glucose-Capillary 205 (H) 65 - 99 mg/dL  Glucose, capillary     Status: None   Collection Time: 10/20/15  6:46 AM  Result Value Ref Range   Glucose-Capillary 94 65 - 99 mg/dL  Glucose, capillary     Status: Abnormal   Collection Time: 10/20/15 11:41 AM  Result Value Ref Range   Glucose-Capillary 159 (H) 65 - 99 mg/dL  CBC     Status: Abnormal   Collection Time: 10/20/15 12:33 PM  Result Value Ref Range   WBC 16.3 (H) 4.0 - 10.5 K/uL   RBC 3.69 (L) 4.22 - 5.81 MIL/uL   Hemoglobin 10.9 (L) 13.0 - 17.0 g/dL   HCT 32.5 (L) 39.0 - 52.0 %   MCV 88.1 78.0 - 100.0 fL   MCH 29.5 26.0 - 34.0 pg   MCHC 33.5 30.0 - 36.0 g/dL   RDW 17.8 (H) 11.5 - 15.5 %   Platelets 485 (H) 150 - 400 K/uL  Heparin level (unfractionated)     Status: Abnormal   Collection Time: 10/20/15 12:33 PM  Result  Value Ref Range   Heparin Unfractionated 0.20 (L) 0.30 - 0.70 IU/mL    Comment:        IF HEPARIN RESULTS ARE BELOW EXPECTED VALUES, AND PATIENT DOSAGE HAS BEEN CONFIRMED, SUGGEST FOLLOW UP TESTING OF ANTITHROMBIN III LEVELS.    Renal function panel     Status: Abnormal   Collection Time: 10/20/15 12:43 PM  Result Value Ref Range   Sodium 133 (L) 135 - 145 mmol/L   Potassium 3.9 3.5 - 5.1 mmol/L   Chloride 97 (L) 101 - 111 mmol/L   CO2 26 22 - 32 mmol/L   Glucose, Bld 175 (H) 65 - 99 mg/dL   BUN 18 6 - 20 mg/dL   Creatinine, Ser 5.07 (H) 0.61 - 1.24 mg/dL   Calcium 8.9 8.9 - 10.3 mg/dL   Phosphorus 3.5 2.5 - 4.6 mg/dL   Albumin 2.7 (L) 3.5 - 5.0 g/dL   GFR calc non Af Amer 12 (L) >60 mL/min   GFR calc Af Amer 14 (L) >60 mL/min    Comment: (NOTE) The eGFR has been calculated using the CKD EPI equation. This calculation has not been validated in all clinical situations. eGFR's persistently <60 mL/min signify possible Chronic Kidney Disease.    Anion gap 10 5 - 15  CBC     Status: Abnormal   Collection Time: 10/20/15 12:43 PM  Result Value Ref Range   WBC 16.9 (H) 4.0 - 10.5 K/uL   RBC 3.73 (L) 4.22 - 5.81 MIL/uL   Hemoglobin 11.0 (L) 13.0 - 17.0 g/dL   HCT 32.8 (L) 39.0 - 52.0 %   MCV 87.9 78.0 - 100.0 fL   MCH 29.5 26.0 - 34.0 pg   MCHC 33.5 30.0 - 36.0 g/dL   RDW 17.9 (H) 11.5 - 15.5 %   Platelets 476 (H) 150 - 400 K/uL  Glucose, capillary     Status: None   Collection Time: 10/20/15  4:42 PM  Result Value Ref Range   Glucose-Capillary 77 65 - 99 mg/dL  Glucose, capillary     Status: Abnormal   Collection Time: 10/20/15  8:35 PM  Result Value Ref Range   Glucose-Capillary 218 (H) 65 - 99 mg/dL  Heparin level (unfractionated)     Status: Abnormal   Collection Time: 10/20/15 11:47 PM  Result Value Ref Range   Heparin Unfractionated 0.17 (L) 0.30 - 0.70 IU/mL    Comment:        IF HEPARIN RESULTS ARE BELOW EXPECTED VALUES, AND PATIENT DOSAGE HAS BEEN CONFIRMED, SUGGEST FOLLOW UP TESTING OF ANTITHROMBIN III LEVELS.     General: No acute distress. Vital signs reviewed Psych: Mood and affect are appropriate Heart: Regular rate and rhythm  Lungs: Clear to auscultation, breathing unlabored,  no rales or wheezes Abdomen: Positive bowel sounds, soft nontender to palpation, nondistended Skin: No evidence of breakdown, no evidence of rash Neurologic:  Motor  RUE/RLE: 5/5 proximal distal LUE: 4 -/5 proximal distal LLE: hip flexion, knee extension 4/5, 3-/5 ankle dorsi/plantar flexion MAS: Right wrist flexion 1+/5, elbow flexion 1/5 Musculoskeletal: No TTP. No joint swelling. No edema  Assessment/Plan: 1. 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 Physiatrist is providing close team supervision and 24 hour management of active medical problems listed below. Physiatrist and rehab team continue to assess barriers to discharge/monitor patient progress toward functional and medical goals. FIM:  Function - Bathing Position: Wheelchair/chair at sink Body parts bathed by patient: Right arm, Chest, Abdomen (UB only this session) Body parts bathed by helper: Left arm, Back Bathing not applicable: Front perineal area, Buttocks, Right upper leg, Left upper leg, Right lower leg, Left lower leg Assist Level:  (min A)  Function- Upper Body Dressing/Undressing What is the patient wearing?: Pull over shirt/dress Pull over shirt/dress - Perfomed by patient: Put head through opening, Pull shirt over trunk Pull over shirt/dress - Perfomed by helper: Thread/unthread left sleeve, Thread/unthread right sleeve Assist Level:  (mod A) Function - Lower Body Dressing/Undressing What is the patient wearing?: Pants, Non-skid slipper socks Position: Wheelchair/chair at sink Pants- Performed by patient: Thread/unthread left pants leg Pants- Performed by helper: Thread/unthread right pants leg, Pull pants up/down Non-skid slipper socks- Performed by helper: Don/doff right sock, Don/doff left sock Assist for footwear: Dependant Assist for  lower body dressing: Touching or steadying assistance (Pt > 75%)        Function - Chair/bed transfer Chair/bed transfer method: Stand pivot Chair/bed transfer assist level: Moderate assist (Pt 50 - 74%/lift or lower) Chair/bed transfer assistive device: Bedrails Chair/bed transfer details: Verbal cues for precautions/safety, Manual facilitation for weight shifting  Function - Locomotion: Wheelchair Will patient use wheelchair at discharge?: No Type: Manual Max wheelchair distance: 20 Assist Level: Touching or steadying assistance (Pt > 75%) Wheel 50 feet with 2 turns activity did not occur: Safety/medical concerns Assist Level: Moderate assistance (Pt 50 - 74%) Wheel 150 feet activity did not occur: Safety/medical concerns Turns around,maneuvers to table,bed, and toilet,negotiates 3% grade,maneuvers on rugs and over doorsills: No Function - Locomotion: Ambulation Ambulation activity did not occur: Safety/medical concerns Assistive device: Walker-rolling Max distance: 50 Assist level: 2 helpers Assist level: Moderate assist (Pt 50 - 74%) Assist level: Moderate assist (Pt 50 - 74%) Walk 150 feet activity did not occur: Safety/medical concerns Walk 10 feet on uneven surfaces activity did not occur: Safety/medical concerns  Function - Comprehension Comprehension: Auditory Comprehension assist level: Understands basic 75 - 89% of the time/ requires cueing 10 - 24% of the time  Function - Expression Expression: Verbal Expression assist level: Expresses basic 50 - 74% of the time/requires cueing 25 - 49% of the time. Needs to repeat parts of sentences.  Function - Social Interaction Social Interaction assist level: Interacts appropriately 90% of the time - Needs monitoring or encouragement for participation or interaction.  Function - Problem Solving Problem solving assist level: Solves basic 75 - 89% of the time/requires cueing 10 - 24% of the time  Function - Memory Memory  assist level: Recognizes or recalls 50 - 74% of the time/requires cueing 25 - 49% of the time Patient normally able to recall (first 3 days only): That he or she is in a hospital, Staff names and faces  Medical Problem List and Plan: 1. Right hemiparesis and aphasia secondary to bi-cerebral embolic infarcts (L>R)- ask neuro eval once imaging from [facility] available   2. PE/DVT/Stoke/Anticoagulation: Pharmaceutical: Lovenox bid per records from [place].  use iv heparin gtt at this time. per hematologist Dr Alen Blew ?warfarin 3. Pain Management: Ultram prn.  4. Mood: LCSW to follow for evaluation and support.  5. Neuropsych: This patient is not fully capable of making decisions on his own behalf. 6. Skin/Wound Care: Routine pressure relief measures.  7. Fluids/Electrolytes/Nutrition: Renal restrictions. 1200 cc/FR.  8. DM type 2: Monitor BS ac/hs. Continue levemir and use SSI for elevated BS.   Fasting CBG pending  this AM  continue current dose Levemir  Will continue to monitor and consider adding scheduled mealtime coverage, if values are persistently elevated 9. HTN: Monitor BP bid and titrate medications as needed. Elevated at admission. Parameters on Midodrine.  10. ESRD on HD: per [facility]. Continue diet/volume mgt, Tunnel dialysis catheter Placed. Appreciate nephrology recs 11. Leukocytosis: no breathing difficulties or coughing. UA negative except for elevated protein as expected from renal failure   WBC slowly trending up, will continue to monitor consider further workup 12.  Hx of GI bleed will monitor for reurrence,Hgb stable at 11.0 on 2/11 13. Dysphagia- Per speech therapy, may be able to upgrade solids to D2  LOS (Days) 9 A FACE TO FACE EVALUATION WAS PERFORMED  Ankit Lorie Phenix 10/21/2015, 7:03 AM

## 2015-10-22 ENCOUNTER — Inpatient Hospital Stay (HOSPITAL_COMMUNITY): Payer: BLUE CROSS/BLUE SHIELD | Admitting: Occupational Therapy

## 2015-10-22 ENCOUNTER — Inpatient Hospital Stay (HOSPITAL_COMMUNITY): Payer: BLUE CROSS/BLUE SHIELD | Admitting: Physical Therapy

## 2015-10-22 ENCOUNTER — Inpatient Hospital Stay (HOSPITAL_COMMUNITY): Payer: BLUE CROSS/BLUE SHIELD | Admitting: Speech Pathology

## 2015-10-22 ENCOUNTER — Encounter (HOSPITAL_COMMUNITY): Payer: Self-pay | Admitting: Physical Medicine and Rehabilitation

## 2015-10-22 LAB — CBC
HCT: 31.3 % — ABNORMAL LOW (ref 39.0–52.0)
Hemoglobin: 10.7 g/dL — ABNORMAL LOW (ref 13.0–17.0)
MCH: 29.6 pg (ref 26.0–34.0)
MCHC: 34.2 g/dL (ref 30.0–36.0)
MCV: 86.5 fL (ref 78.0–100.0)
Platelets: 443 10*3/uL — ABNORMAL HIGH (ref 150–400)
RBC: 3.62 MIL/uL — ABNORMAL LOW (ref 4.22–5.81)
RDW: 17.4 % — ABNORMAL HIGH (ref 11.5–15.5)
WBC: 15.7 10*3/uL — ABNORMAL HIGH (ref 4.0–10.5)

## 2015-10-22 LAB — HEPARIN LEVEL (UNFRACTIONATED): Heparin Unfractionated: 0.35 IU/mL (ref 0.30–0.70)

## 2015-10-22 LAB — GLUCOSE, CAPILLARY
Glucose-Capillary: 126 mg/dL — ABNORMAL HIGH (ref 65–99)
Glucose-Capillary: 231 mg/dL — ABNORMAL HIGH (ref 65–99)
Glucose-Capillary: 168 mg/dL — ABNORMAL HIGH (ref 65–99)
Glucose-Capillary: 199 mg/dL — ABNORMAL HIGH (ref 65–99)

## 2015-10-22 MED ORDER — MIDODRINE HCL 2.5 MG PO TABS
2.5000 mg | ORAL_TABLET | Freq: Two times a day (BID) | ORAL | Status: DC
  Administered 2015-10-22 – 2015-11-01 (×21): 2.5 mg via ORAL
  Filled 2015-10-22 (×23): qty 1

## 2015-10-22 NOTE — Progress Notes (Signed)
Assessment:  1. New ESRD  2. Embolic CVA 3. Anemia on aranesp 4. Sec HPTH PTH 211 so not on Vit D 5. Hypotension on midodrine 6. Hx MGUS  Plan: 1. HD tuesday 2. Plan to go to [facility] after DC. TTS 2nd shift. 3.will wean midodrine  Subjective: Interval History: No chg  Objective: Vital signs in last 24 hours: Temp:  [98 F (36.7 C)-98.2 F (36.8 C)] 98.2 F (36.8 C) (02/13 1328) Pulse Rate:  [87-90] 90 (02/13 1328) Resp:  [18] 18 (02/13 1328) BP: (121-133)/(82-85) 133/85 mmHg (02/13 1328) SpO2:  [93 %-99 %] 99 % (02/13 1328) Weight change:   Intake/Output from previous day: 02/12 0701 - 02/13 0700 In: 480 [P.O.:480] Out: -  Intake/Output this shift: Total I/O In: 360 [P.O.:360] Out: -   General appearance: alert, cooperative and dysarthric speech Head: Normocephalic, without obvious abnormality, atraumatic Chest wall: no tenderness Cardio: regular rate and rhythm, S1, S2 normal, no murmur, click, rub or gallop GI: soft, non-tender; bowel sounds normal; no masses,  no organomegaly Extremities: extremities normal, atraumatic, no cyanosis or edema  PC in right cw  Lab Results:  Recent Labs  10/21/15 0757 10/22/15 0610  WBC 17.3* 15.7*  HGB 10.7* 10.7*  HCT 32.5* 31.3*  PLT 482* 443*   BMET:  Recent Labs  10/20/15 1243  NA 133*  K 3.9  CL 97*  CO2 26  GLUCOSE 175*  BUN 18  CREATININE 5.07*  CALCIUM 8.9   No results for input(s): PTH in the last 72 hours. Iron Studies: No results for input(s): IRON, TIBC, TRANSFERRIN, FERRITIN in the last 72 hours. Studies/Results: No results found.  Scheduled: . antiseptic oral rinse  7 mL Mouth Rinse BID  . aspirin EC  81 mg Oral Daily  . darbepoetin (ARANESP) injection - DIALYSIS  100 mcg Intravenous Q Tue-HD  . fluticasone furoate-vilanterol  1 puff Inhalation Daily  . insulin aspart  0-5 Units Subcutaneous QHS  . insulin aspart  0-9 Units Subcutaneous TID WC  . insulin detemir  20 Units Subcutaneous QHS   . magic mouthwash w/lidocaine  5 mL Oral QID  . midodrine  5 mg Oral BID WC  . montelukast  10 mg Oral QHS  . multivitamin  1 tablet Oral QHS  . pantoprazole  40 mg Oral BID     LOS: 10 days   Lilianne Delair C 10/22/2015,5:06 PM

## 2015-10-22 NOTE — Progress Notes (Signed)
Speech Language Pathology Daily Session Note  Patient Details  Name: Isaiah [name] [name] MRN: [id number] Date of Birth: 14-Oct-1965  Today's Date: 10/22/2015 SLP Individual Time: 0825-0905 SLP Individual Time Calculation (min): 40 min  Short Term Goals: Week 2: SLP Short Term Goal 1 (Week 2): Pt will consume dys 2 textures and thin liquids with minimal overt s/s of aspiration and supervision for use of swallowing precautions over 2 targeted sessions prior to advancement.   SLP Short Term Goal 2 (Week 2): Pt will self-monitor and correct verbal perseveration with mod assist multimodal cues. SLP Short Term Goal 3 (Week 2): Pt will name familiar objects with min assist semantic cues.  SLP Short Term Goal 4 (Week 2): Pt will utilize over articulation for production of phonemes at the CV, CVC/word level for accurate production of vowels and bilabials with Min verbal, visual cues  SLP Short Term Goal 5 (Week 2): Pt will verbalize at the phrase level with mod assist verbal cues for 75% accuracy  Skilled Therapeutic Interventions: Skilled treatment session focused on addressing dysphagia and cognitive-linguistic goals. SLP facilitated session with Mod verbal and visual cues to problem solve set up of meal tray.  Patient also required Min-Mod verbal, sematic cues to label items on breakfast tray.  SLP also facilitated session with advanced trails of Dys.2 textures which patient consumed with extra time for effective mastication.  Trials of Dys.2 textures and thin liquids with intermittent use of extra swallow resulted in no overt s/s of aspiration.  Recommend diet upgrade with continued full supervision to ensure that pocketing during a full meal is managed safely.  Continue with current plan of care.   Function:  Eating Eating   Modified Consistency Diet: Yes Eating Assist Level: Supervision or verbal cues;Set up assist for;Helper checks for pocketed food   Eating Set Up Assist For: Opening containers        Cognition Comprehension Comprehension assist level: Understands basic 75 - 89% of the time/ requires cueing 10 - 24% of the time  Expression   Expression assist level: Expresses basic 50 - 74% of the time/requires cueing 25 - 49% of the time. Needs to repeat parts of sentences.  Social Interaction Social Interaction assist level: Interacts appropriately 90% of the time - Needs monitoring or encouragement for participation or interaction.  Problem Solving Problem solving assist level: Solves basic 50 - 74% of the time/requires cueing 25 - 49% of the time  Memory Memory assist level: Recognizes or recalls 50 - 74% of the time/requires cueing 25 - 49% of the time    Pain Pain Assessment Pain Assessment: No/denies pain  Therapy/Group: Individual Therapy  Carmelia Roller., [place] [phone]  [place] 10/22/2015, 11:20 AM

## 2015-10-22 NOTE — Progress Notes (Signed)
ANTICOAGULATION CONSULT NOTE - Follow Up Consult  Pharmacy Consult for heparin Indication: DVT/PE/stroke  Allergies  Allergen Reactions  . Codeine Other (See Comments)    Pt unknown  . Codeine Rash and Other (See Comments)    Unknown reaction (patient says it was more serious than just a rash, but he can't remember what happened)    Patient Measurements: Height: 5' 11"  (180.3 cm) Weight: 201 lb 15.1 oz (91.6 kg) IBW/kg (Calculated) : 75.3 Heparin Dosing Weight:   Vital Signs: Temp: 98 F (36.7 C) (02/13 0638) Temp Source: Axillary (02/13 0638) BP: 121/82 mmHg (02/13 0638) Pulse Rate: 87 (02/13 0638)  Labs:  Recent Labs  10/20/15 1243  10/21/15 0757 10/21/15 1638 10/22/15 0610  HGB 11.0*  --  10.7*  --  10.7*  HCT 32.8*  --  32.5*  --  31.3*  PLT 476*  --  482*  --  443*  HEPARINUNFRC  --   < > 0.37 0.31 0.35  CREATININE 5.07*  --   --   --   --   < > = values in this interval not displayed.  Estimated Creatinine Clearance: 19.9 mL/min (by C-G formula based on Cr of 5.07).   Medications:  Scheduled:  . antiseptic oral rinse  7 mL Mouth Rinse BID  . aspirin EC  81 mg Oral Daily  . darbepoetin (ARANESP) injection - DIALYSIS  100 mcg Intravenous Q Tue-HD  . Fluticasone Furoate-Vilanterol  1 puff Inhalation Daily  . insulin aspart  0-5 Units Subcutaneous QHS  . insulin aspart  0-9 Units Subcutaneous TID WC  . insulin detemir  20 Units Subcutaneous QHS  . magic mouthwash w/lidocaine  5 mL Oral QID  . midodrine  5 mg Oral BID WC  . montelukast  10 mg Oral QHS  . multivitamin  1 tablet Oral QHS  . pantoprazole  40 mg Oral BID   Infusions:  . sodium chloride 10 mL/hr at 10/18/15 2157  . heparin 1,800 Units/hr (10/21/15 2139)    Assessment: 50 yo male with hx of PE/DVT/stroke is currently on therapeutic heparin.  Heparin level is 0.35 today.  Goal of Therapy:  Heparin level 0.3-0.5 units/ml Monitor platelets by anticoagulation protocol: Yes   Plan:  Cont  Heparin to 1800 units/hr  Daily HL, CBC Monitor for s/sx of bleeding   Isaiah [name], [place] 10/22/2015,8:23 AM

## 2015-10-22 NOTE — Progress Notes (Signed)
Subjective/Complaints: Appears brighter today  ROS- appears to deny CP, SOB, N/V/D  Objective: Vital Signs: Blood pressure 121/82, pulse 87, temperature 98 F (36.7 C), temperature source Axillary, resp. rate 18, height 5' 11" (1.803 m), weight 91.6 kg (201 lb 15.1 oz), SpO2 98 %. No results found. Results for orders placed or performed during the hospital encounter of 10/12/15 (from the past 72 hour(s))  Heparin level (unfractionated)     Status: Abnormal   Collection Time: 10/19/15 10:27 AM  Result Value Ref Range   Heparin Unfractionated 0.21 (L) 0.30 - 0.70 IU/mL    Comment:        IF HEPARIN RESULTS ARE BELOW EXPECTED VALUES, AND PATIENT DOSAGE HAS BEEN CONFIRMED, SUGGEST FOLLOW UP TESTING OF ANTITHROMBIN III LEVELS.   Glucose, capillary     Status: Abnormal   Collection Time: 10/19/15 12:09 PM  Result Value Ref Range   Glucose-Capillary 254 (H) 65 - 99 mg/dL  Glucose, capillary     Status: Abnormal   Collection Time: 10/19/15  4:37 PM  Result Value Ref Range   Glucose-Capillary 138 (H) 65 - 99 mg/dL  Heparin level (unfractionated)     Status: Abnormal   Collection Time: 10/19/15  7:23 PM  Result Value Ref Range   Heparin Unfractionated 0.10 (L) 0.30 - 0.70 IU/mL    Comment:        IF HEPARIN RESULTS ARE BELOW EXPECTED VALUES, AND PATIENT DOSAGE HAS BEEN CONFIRMED, SUGGEST FOLLOW UP TESTING OF ANTITHROMBIN III LEVELS.   Glucose, capillary     Status: Abnormal   Collection Time: 10/19/15  8:55 PM  Result Value Ref Range   Glucose-Capillary 205 (H) 65 - 99 mg/dL  Glucose, capillary     Status: None   Collection Time: 10/20/15  6:46 AM  Result Value Ref Range   Glucose-Capillary 94 65 - 99 mg/dL  Glucose, capillary     Status: Abnormal   Collection Time: 10/20/15 11:41 AM  Result Value Ref Range   Glucose-Capillary 159 (H) 65 - 99 mg/dL  CBC     Status: Abnormal   Collection Time: 10/20/15 12:33 PM  Result Value Ref Range   WBC 16.3 (H) 4.0 - 10.5 K/uL   RBC  3.69 (L) 4.22 - 5.81 MIL/uL   Hemoglobin 10.9 (L) 13.0 - 17.0 g/dL   HCT 32.5 (L) 39.0 - 52.0 %   MCV 88.1 78.0 - 100.0 fL   MCH 29.5 26.0 - 34.0 pg   MCHC 33.5 30.0 - 36.0 g/dL   RDW 17.8 (H) 11.5 - 15.5 %   Platelets 485 (H) 150 - 400 K/uL  Heparin level (unfractionated)     Status: Abnormal   Collection Time: 10/20/15 12:33 PM  Result Value Ref Range   Heparin Unfractionated 0.20 (L) 0.30 - 0.70 IU/mL    Comment:        IF HEPARIN RESULTS ARE BELOW EXPECTED VALUES, AND PATIENT DOSAGE HAS BEEN CONFIRMED, SUGGEST FOLLOW UP TESTING OF ANTITHROMBIN III LEVELS.   Renal function panel     Status: Abnormal   Collection Time: 10/20/15 12:43 PM  Result Value Ref Range   Sodium 133 (L) 135 - 145 mmol/L   Potassium 3.9 3.5 - 5.1 mmol/L   Chloride 97 (L) 101 - 111 mmol/L   CO2 26 22 - 32 mmol/L   Glucose, Bld 175 (H) 65 - 99 mg/dL   BUN 18 6 - 20 mg/dL   Creatinine, Ser 5.07 (H) 0.61 - 1.24 mg/dL  Calcium 8.9 8.9 - 10.3 mg/dL   Phosphorus 3.5 2.5 - 4.6 mg/dL   Albumin 2.7 (L) 3.5 - 5.0 g/dL   GFR calc non Af Amer 12 (L) >60 mL/min   GFR calc Af Amer 14 (L) >60 mL/min    Comment: (NOTE) The eGFR has been calculated using the CKD EPI equation. This calculation has not been validated in all clinical situations. eGFR's persistently <60 mL/min signify possible Chronic Kidney Disease.    Anion gap 10 5 - 15  CBC     Status: Abnormal   Collection Time: 10/20/15 12:43 PM  Result Value Ref Range   WBC 16.9 (H) 4.0 - 10.5 K/uL   RBC 3.73 (L) 4.22 - 5.81 MIL/uL   Hemoglobin 11.0 (L) 13.0 - 17.0 g/dL   HCT 32.8 (L) 39.0 - 52.0 %   MCV 87.9 78.0 - 100.0 fL   MCH 29.5 26.0 - 34.0 pg   MCHC 33.5 30.0 - 36.0 g/dL   RDW 17.9 (H) 11.5 - 15.5 %   Platelets 476 (H) 150 - 400 K/uL  Glucose, capillary     Status: None   Collection Time: 10/20/15  4:42 PM  Result Value Ref Range   Glucose-Capillary 77 65 - 99 mg/dL  Glucose, capillary     Status: Abnormal   Collection Time: 10/20/15  8:35  PM  Result Value Ref Range   Glucose-Capillary 218 (H) 65 - 99 mg/dL  Heparin level (unfractionated)     Status: Abnormal   Collection Time: 10/20/15 11:47 PM  Result Value Ref Range   Heparin Unfractionated 0.17 (L) 0.30 - 0.70 IU/mL    Comment:        IF HEPARIN RESULTS ARE BELOW EXPECTED VALUES, AND PATIENT DOSAGE HAS BEEN CONFIRMED, SUGGEST FOLLOW UP TESTING OF ANTITHROMBIN III LEVELS.   Glucose, capillary     Status: None   Collection Time: 10/21/15  7:01 AM  Result Value Ref Range   Glucose-Capillary 91 65 - 99 mg/dL  CBC     Status: Abnormal   Collection Time: 10/21/15  7:57 AM  Result Value Ref Range   WBC 17.3 (H) 4.0 - 10.5 K/uL   RBC 3.73 (L) 4.22 - 5.81 MIL/uL   Hemoglobin 10.7 (L) 13.0 - 17.0 g/dL   HCT 32.5 (L) 39.0 - 52.0 %   MCV 87.1 78.0 - 100.0 fL   MCH 28.7 26.0 - 34.0 pg   MCHC 32.9 30.0 - 36.0 g/dL   RDW 17.5 (H) 11.5 - 15.5 %   Platelets 482 (H) 150 - 400 K/uL  Heparin level (unfractionated)     Status: None   Collection Time: 10/21/15  7:57 AM  Result Value Ref Range   Heparin Unfractionated 0.37 0.30 - 0.70 IU/mL    Comment:        IF HEPARIN RESULTS ARE BELOW EXPECTED VALUES, AND PATIENT DOSAGE HAS BEEN CONFIRMED, SUGGEST FOLLOW UP TESTING OF ANTITHROMBIN III LEVELS.   Glucose, capillary     Status: Abnormal   Collection Time: 10/21/15 11:29 AM  Result Value Ref Range   Glucose-Capillary 207 (H) 65 - 99 mg/dL  Glucose, capillary     Status: Abnormal   Collection Time: 10/21/15  4:31 PM  Result Value Ref Range   Glucose-Capillary 170 (H) 65 - 99 mg/dL  Heparin level (unfractionated)     Status: None   Collection Time: 10/21/15  4:38 PM  Result Value Ref Range   Heparin Unfractionated 0.31 0.30 - 0.70 IU/mL  Comment:        IF HEPARIN RESULTS ARE BELOW EXPECTED VALUES, AND PATIENT DOSAGE HAS BEEN CONFIRMED, SUGGEST FOLLOW UP TESTING OF ANTITHROMBIN III LEVELS.   Glucose, capillary     Status: Abnormal   Collection Time: 10/21/15   9:16 PM  Result Value Ref Range   Glucose-Capillary 243 (H) 65 - 99 mg/dL  CBC     Status: Abnormal   Collection Time: 10/22/15  6:10 AM  Result Value Ref Range   WBC 15.7 (H) 4.0 - 10.5 K/uL   RBC 3.62 (L) 4.22 - 5.81 MIL/uL   Hemoglobin 10.7 (L) 13.0 - 17.0 g/dL   HCT 31.3 (L) 39.0 - 52.0 %   MCV 86.5 78.0 - 100.0 fL   MCH 29.6 26.0 - 34.0 pg   MCHC 34.2 30.0 - 36.0 g/dL   RDW 17.4 (H) 11.5 - 15.5 %   Platelets 443 (H) 150 - 400 K/uL  Heparin level (unfractionated)     Status: None   Collection Time: 10/22/15  6:10 AM  Result Value Ref Range   Heparin Unfractionated 0.35 0.30 - 0.70 IU/mL    Comment:        IF HEPARIN RESULTS ARE BELOW EXPECTED VALUES, AND PATIENT DOSAGE HAS BEEN CONFIRMED, SUGGEST FOLLOW UP TESTING OF ANTITHROMBIN III LEVELS.   Glucose, capillary     Status: Abnormal   Collection Time: 10/22/15  6:55 AM  Result Value Ref Range   Glucose-Capillary 126 (H) 65 - 99 mg/dL    General: No acute distress. Vital signs reviewed Psych: Mood and affect are appropriate Heart: Regular rate and rhythm  Lungs: Clear to auscultation, breathing unlabored, no rales or wheezes Abdomen: Positive bowel sounds, soft nontender to palpation, nondistended Skin: No evidence of breakdown, no evidence of rash Neurologic:  Motor  RUE/RLE: 5/5 proximal distal LUE: 4 -/5 proximal distal LLE: hip flexion, knee extension 4/5, 3-/5 ankle dorsi/plantar flexion MAS: Right wrist flexion 1+/5, elbow flexion 1/5 Musculoskeletal: No TTP. No joint swelling. No edema Able to name stethoscope Assessment/Plan: 1. 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 Physiatrist is providing close team supervision and 24 hour management of active medical problems listed below. Physiatrist and rehab team continue to assess barriers to  discharge/monitor patient progress toward functional and medical goals. FIM: Function - Bathing Position: Wheelchair/chair at sink Body parts bathed by patient: Right arm, Chest, Abdomen (UB only this session) Body parts bathed by helper: Left arm, Back Bathing not applicable: Front perineal area, Buttocks, Right upper leg, Left upper leg, Right lower leg, Left lower leg Assist Level:  (min A)  Function- Upper Body Dressing/Undressing What is the patient wearing?: Pull over shirt/dress Pull over shirt/dress - Perfomed by patient: Put head through opening, Pull shirt over trunk Pull over shirt/dress - Perfomed by helper: Thread/unthread left sleeve, Thread/unthread right sleeve Assist Level:  (mod A) Function - Lower Body Dressing/Undressing What is the patient wearing?: Pants, Non-skid slipper socks Position: Wheelchair/chair at sink Pants- Performed by patient: Thread/unthread left pants leg Pants- Performed by helper: Thread/unthread right pants leg, Pull pants up/down Non-skid slipper socks- Performed by helper: Don/doff right sock, Don/doff left sock Assist for footwear: Dependant Assist for lower body dressing: Touching or steadying assistance (Pt > 75%)  Function - Toileting Toileting steps completed by helper: Adjust clothing prior to toileting, Performs perineal hygiene, Adjust clothing after toileting   Toileting Assistive Devices: Grab bar or rail Assist level: Two helpers  Function - Toilet Transfers Toilet transfer assistive device: Grab bar Assist level to toilet: Moderate assist (Pt 50 - 74%/lift or lower) Assist level from toilet: Moderate assist (Pt 50 - 74%/lift or lower)  Function - Chair/bed transfer Chair/bed transfer method: Stand pivot Chair/bed transfer assist level: Moderate assist (Pt 50 - 74%/lift or lower) Chair/bed transfer assistive device: Bedrails Chair/bed transfer details: Verbal cues for precautions/safety, Manual facilitation for weight  shifting  Function - Locomotion: Wheelchair Will patient use wheelchair at discharge?: No Type: Manual Max wheelchair distance: 20 Assist Level: Touching or steadying assistance (Pt > 75%) Wheel 50 feet with 2 turns activity did not occur: Safety/medical concerns Assist Level: Moderate assistance (Pt 50 - 74%) Wheel 150 feet activity did not occur: Safety/medical concerns Turns around,maneuvers to table,bed, and toilet,negotiates 3% grade,maneuvers on rugs and over doorsills: No Function - Locomotion: Ambulation Ambulation activity did not occur: Safety/medical concerns Assistive device: Walker-rolling Max distance: 50 Assist level: 2 helpers Assist level: Moderate assist (Pt 50 - 74%) Assist level: Moderate assist (Pt 50 - 74%) Walk 150 feet activity did not occur: Safety/medical concerns Walk 10 feet on uneven surfaces activity did not occur: Safety/medical concerns  Function - Comprehension Comprehension: Auditory Comprehension assist level: Understands basic 75 - 89% of the time/ requires cueing 10 - 24% of the time  Function - Expression Expression: Verbal Expression assist level: Expresses basic 50 - 74% of the time/requires cueing 25 - 49% of the time. Needs to repeat parts of sentences.  Function - Social Interaction Social Interaction assist level: Interacts appropriately 90% of the time - Needs monitoring or encouragement for participation or interaction.  Function - Problem Solving Problem solving assist level: Solves basic 75 - 89% of the time/requires cueing 10 - 24% of the time  Function - Memory Memory assist level: Recognizes or recalls 50 - 74% of the time/requires cueing 25 - 49% of the time Patient normally able to recall (first 3 days only): That he or she is in a hospital, Staff names and faces  Medical Problem List and Plan: 1. Right hemiparesis and aphasia secondary to bi-cerebral embolic infarcts (L>R)- ask neuro eval once imaging from [place] available    2. PE/DVT/Stoke/Anticoagulation: Pharmaceutical: Lovenox bid per records from [place].  use iv heparin gtt at this time. per hematologist Dr [name] ?warfarin-will need to make decision prior to d/c 2/24 3. Pain Management: Ultram prn.  4. Mood: LCSW to follow for evaluation and support.  5. Neuropsych: This patient is not fully capable of making decisions on his own behalf. 6. Skin/Wound Care: Routine pressure relief measures.  7. Fluids/Electrolytes/Nutrition: Renal restrictions. 1200 cc/FR.  8. DM type 2: Monitor BS ac/hs. Continue levemir and use SSI for elevated BS.   Fasting CBG 126- acceptable  continue current dose Levemir   9. HTN: Monitor BP bid and titrate medications as needed. Elevated at admission. Parameters on Midodrine.  10. ESRD on HD: per [facility]. Continue diet/volume mgt, Tunnel dialysis catheter Placed. Appreciate nephrology recs 11. Leukocytosis: no breathing difficulties or coughing. UA negative except for elevated protein as expected from renal failure   WBC improved this am reviewed labs 12.  Hx of GI bleed will monitor for reurrence,Hgb stable at 11.0 on 2/11 13. Dysphagia- Per speech therapy, may be able to upgrade solids to D2  LOS (Days) 10 A FACE TO FACE EVALUATION WAS PERFORMED  , E 10/22/2015, 8:06 AM    

## 2015-10-22 NOTE — Progress Notes (Signed)
[place] [facility] Individual Statement of Services  Patient Name:  Damarian Priola [facility]  Date:  10/22/2015  Welcome to the [facility].  Our goal is to provide you with an individualized program based on your diagnosis and situation, designed to meet your specific needs.  With this comprehensive rehabilitation program, you will be expected to participate in at least 3 hours of rehabilitation therapies Monday-Friday, with modified therapy programming on the weekends.  Your rehabilitation program will include the following services:  Physical Therapy (PT), Occupational Therapy (OT), Speech Therapy (ST), 24 hour per day rehabilitation nursing, Case Management (Social Worker), Rehabilitation Medicine, Nutrition Services and Pharmacy Services  Weekly team conferences will be held on Wednesdays to discuss your progress.  Your Social Worker will talk with you frequently to get your input and to update you on team discussions.  Team conferences with you and your family in attendance may also be held.  Expected length of stay:  3 weeks  Overall anticipated outcome:   Supervision with some minimal assistance needed for bathing, toileting, ambulation in community, and stairs  Depending on your progress and recovery, your program may change. Your Social Worker will coordinate services and will keep you informed of any changes. Your Social Worker's name and contact numbers are listed  below.  The following services may also be recommended but are not provided by the [facility] will be made to provide these services after discharge if needed.  Arrangements include referral to agencies that provide these services.  Your insurance has been verified to be:  [employer] Your primary doctor is:  Dr.  Iona Beard  Pertinent information will be shared with your doctor and your insurance company.  Social Worker:  Alfonse Alpers, LCSW  [phone] or [phone]  Information discussed with and copy given to patient by: Trey Sailors, 10/22/2015, 9:24 AM

## 2015-10-22 NOTE — Progress Notes (Signed)
10/22/2015 3:12 PM Hemodialysis Outpatient Note; This patient has been accepted at the [facility] on a Monday, Wednesday and Friday 2nd shift schedule. The center is withholding chair time until snf placement determined.Thank you.Gordy Savers

## 2015-10-22 NOTE — Progress Notes (Signed)
Occupational Therapy Session Note  Patient Details  Name: Isaiah [name] MRN: [id number] Date of Birth: 1966/04/08  Today's Date: 10/22/2015 OT Individual Time: 1000-1059 OT Individual Time Calculation (min): 59 min    Short Term Goals: Week 2:  OT Short Term Goal 1 (Week 2): Pt will complete toilet transfer with min assist OT Short Term Goal 2 (Week 2): Pt will complete LB dressing with mod assist OT Short Term Goal 3 (Week 2): Pt will complete UB dressing with min assist OT Short Term Goal 4 (Week 2): Pt will engage in functional activity in standing for 1 minute with min asisst OT Short Term Goal 5 (Week 2): Pt will use the LUE as an active assist for bathing tasks with mod assist.    Skilled Therapeutic Interventions/Progress Updates:    Pt completed bathing and dressing sit to stand at the sink.  Min assist needed for sit to stand and standing to wash peri area and pull pants over hips.  Mod assist for RUE use to wash the left arm and axillary region.  Mod instructional cueing overall for sequencing through bathing as well.  Pt will improved shoulder function in the RUE but still with limited digit movement.  Pt left in wheelchair at end of session working on RUE movement using washcloth on lap tray.  Call button and phone within reach.  Safety belt in place as well.   Therapy Documentation Precautions:  Precautions Precautions: Fall Precaution Comments: expressive/ receptive difficulties Restrictions Weight Bearing Restrictions: No  Pain:  No report of pain during session ADL: See Function Navigator for Current Functional Status.   Therapy/Group: Individual Therapy  Sonakshi Rolland OTR/L 10/22/2015, 1:11 PM

## 2015-10-22 NOTE — Plan of Care (Signed)
Problem: RH Bed Mobility Goal: LTG Patient will perform bed mobility with assist (PT) LTG: Patient will perform bed mobility with assistance, with/without cues (PT).  Upgraded due to fast progress 10/22/15  Problem: RH Bed to Chair Transfers Goal: LTG Patient will perform bed/chair transfers w/assist (PT) LTG: Patient will perform bed/chair transfers with assistance, with/without cues (PT).  Upgraded due to fast progress 10/22/15  Problem: RH Furniture Transfers Goal: LTG Patient will perform furniture transfers w/assist (OT/PT LTG: Patient will perform furniture transfers with assistance (OT/PT).  Upgraded due to fast progress 10/22/15

## 2015-10-22 NOTE — Progress Notes (Signed)
Physical Therapy Weekly Progress Note  Patient Details  Name: [place] MRN: [id number] Date of Birth: May 26, 1966  Beginning of progress report period: October 13, 2015 End of progress report period: October 22, 2015  Today's Date: 10/22/2015 PT Individual Time: 9166-0600 PT Individual Time Calculation (min): 92 min   Patient has made good progress and has met 3 of 3 short term goals.  Pt is currently min-assist for bed mobility, basic and car transfers, gait with and without RW, stair negotiation and w/c mobility.       Patient continues to demonstrate the following deficits: R sided hemiparesis with R inattention, impaired motor planning and motor control, impaired postural control, balance, gait and therefore will continue to benefit from skilled PT intervention to enhance overall performance with balance, postural control, ability to compensate for deficits, functional use of  right upper extremity and right lower extremity, attention and awareness.  Patient progressing toward long term goals..  Plan of care revisions: Goals upgraded to mod I for bed mobility and transfers, standing balance goal added.  PT Short Term Goals Week 1:  PT Short Term Goal 1 (Week 1): Pt will perform bed mobility mod A and LRAD PT Short Term Goal 1 - Progress (Week 1): Met PT Short Term Goal 2 (Week 1): Pt will perform transfers with mod A and LRAD PT Short Term Goal 2 - Progress (Week 1): Met PT Short Term Goal 3 (Week 1): Pt will initiate gait PT Short Term Goal 3 - Progress (Week 1): Met Week 2:  PT Short Term Goal 1 (Week 2): Pt will perform bed mobility from flat bed consistently with supervision PT Short Term Goal 2 (Week 2): Pt will perform bed <> chair transfers with consistent supervision PT Short Term Goal 3 (Week 2): Pt will perform stair negotiation up/down 8 stairs with two rails and min A PT Short Term Goal 4 (Week 2): Pt will perform gait with LRAD in controlled environment x 150' with  consistent min A PT Short Term Goal 5 (Week 2): Pt will perform car transfer with supervision  Skilled Therapeutic Interventions/Progress Updates:   Pt received in room in w/c with mother present.  Pt denied pain.  Performed w/c mobility x 150' in controlled environment with bilat LE propulsion secondary to Heparin IV in LUE with min A and verbal cues for sequencing/technique.  In gym discussed with pt height and set up of bed at home.  Mat raised to approximate height of bed at home; pt performed stand pivot transfer w/c > mat with min A for weight shifting and balance while pivoting.  Pt required min A for scooting posterior on mat and min A to perform controlled sit > supine on flat mat to R side.  On mat pt performed rolling side <> supine with min A and education on positioning of RUE while lying on R side with handout provided and placed above pt bed in room.  Pt performed supine > sit from R side and scooting back to EOB with min A.  Performed gait with RW x 140' in controlled environment with R hand orthosis with min A and verbal cues for sequencing of placement and removal of RUE in orthosis as well as min A for lateral weight shifting and verbal cues for full step length, foot clearance and heel strike on R side.  Pt performed stair negotiation training up/down 4 stairs with bilat UE support on rails with pt self selecting step to sequencing ascending  with RLE and descending with LLE with mod A for advancement of COG to next step as well as to maintain placement of RUE on rail.  Pt performed NMR; see below for details.  Continued transfer training with pt performing simulated SUV transfer with pt educated on safety and sitting first before bringing LE into car; pt return demonstrated with min A.  Returned to gym to perform bilat UE and LE strengthening, endurance and reciprocal training on Nustep at level 6 resistance x 8 minutes.  At end of session pt returned to room and left in w/c with all items  within reach.  Therapy Documentation Precautions:  Precautions Precautions: Fall Precaution Comments: expressive/ receptive difficulties Restrictions Weight Bearing Restrictions: No Other Treatments: Treatments Neuromuscular Facilitation: Right;Left;Lower Extremity;Activity to increase motor control;Activity to increase timing and sequencing;Activity to increase lateral weight shifting;Activity to increase anterior-posterior weight shifting during lateral stepping x 25' to L and R x 6-8 reps without AD on bare floor and then with use of floor ladder to facilitate full weight shift laterally and full foot clearance.  Pt required min-mod A for full weight shifting, to minimize rotation of pelvis and eccentric control of weight shift to R.     See Function Navigator for Current Functional Status.  Therapy/Group: Individual Therapy  Raylene Everts [facility] 10/22/2015, 7:48 PM

## 2015-10-23 ENCOUNTER — Ambulatory Visit (HOSPITAL_COMMUNITY): Payer: Self-pay | Admitting: *Deleted

## 2015-10-23 ENCOUNTER — Encounter: Payer: Self-pay | Admitting: *Deleted

## 2015-10-23 ENCOUNTER — Inpatient Hospital Stay (HOSPITAL_COMMUNITY): Payer: Self-pay | Admitting: Physical Therapy

## 2015-10-23 ENCOUNTER — Inpatient Hospital Stay (HOSPITAL_COMMUNITY): Payer: BLUE CROSS/BLUE SHIELD | Admitting: Occupational Therapy

## 2015-10-23 ENCOUNTER — Inpatient Hospital Stay (HOSPITAL_COMMUNITY): Payer: BLUE CROSS/BLUE SHIELD | Admitting: Speech Pathology

## 2015-10-23 DIAGNOSIS — N186 End stage renal disease: Secondary | ICD-10-CM | POA: Diagnosis present

## 2015-10-23 DIAGNOSIS — Z992 Dependence on renal dialysis: Secondary | ICD-10-CM

## 2015-10-23 LAB — CBC
HCT: 31.9 % — ABNORMAL LOW (ref 39.0–52.0)
HCT: 28.1 % — ABNORMAL LOW (ref 39.0–52.0)
Hemoglobin: 10.6 g/dL — ABNORMAL LOW (ref 13.0–17.0)
Hemoglobin: 9.6 g/dL — ABNORMAL LOW (ref 13.0–17.0)
MCH: 28.5 pg (ref 26.0–34.0)
MCH: 29.4 pg (ref 26.0–34.0)
MCHC: 33.2 g/dL (ref 30.0–36.0)
MCHC: 34.2 g/dL (ref 30.0–36.0)
MCV: 85.8 fL (ref 78.0–100.0)
MCV: 85.9 fL (ref 78.0–100.0)
Platelets: 476 10*3/uL — ABNORMAL HIGH (ref 150–400)
Platelets: 449 10*3/uL — ABNORMAL HIGH (ref 150–400)
RBC: 3.72 MIL/uL — ABNORMAL LOW (ref 4.22–5.81)
RBC: 3.27 MIL/uL — ABNORMAL LOW (ref 4.22–5.81)
RDW: 17.5 % — ABNORMAL HIGH (ref 11.5–15.5)
RDW: 17.5 % — ABNORMAL HIGH (ref 11.5–15.5)
WBC: 15.3 10*3/uL — ABNORMAL HIGH (ref 4.0–10.5)
WBC: 15.2 10*3/uL — ABNORMAL HIGH (ref 4.0–10.5)

## 2015-10-23 LAB — GLUCOSE, CAPILLARY
Glucose-Capillary: 91 mg/dL (ref 65–99)
Glucose-Capillary: 221 mg/dL — ABNORMAL HIGH (ref 65–99)
Glucose-Capillary: 96 mg/dL (ref 65–99)
Glucose-Capillary: 114 mg/dL — ABNORMAL HIGH (ref 65–99)

## 2015-10-23 LAB — RENAL FUNCTION PANEL
Albumin: 2.2 g/dL — ABNORMAL LOW (ref 3.5–5.0)
Anion gap: 13 (ref 5–15)
BUN: 27 mg/dL — ABNORMAL HIGH (ref 6–20)
CO2: 25 mmol/L (ref 22–32)
Calcium: 8.9 mg/dL (ref 8.9–10.3)
Chloride: 101 mmol/L (ref 101–111)
Creatinine, Ser: 5.66 mg/dL — ABNORMAL HIGH (ref 0.61–1.24)
GFR calc Af Amer: 12 mL/min — ABNORMAL LOW (ref >60)
GFR calc non Af Amer: 10 mL/min — ABNORMAL LOW (ref >60)
Glucose, Bld: 122 mg/dL — ABNORMAL HIGH (ref 65–99)
Phosphorus: 3.7 mg/dL (ref 2.5–4.6)
Potassium: 4.5 mmol/L (ref 3.5–5.1)
Sodium: 139 mmol/L (ref 135–145)

## 2015-10-23 LAB — HEPARIN LEVEL (UNFRACTIONATED): Heparin Unfractionated: 0.41 IU/mL (ref 0.30–0.70)

## 2015-10-23 MED ORDER — PENTAFLUOROPROP-TETRAFLUOROETH EX AERO: 1.0000 "application " | INHALATION_SPRAY | CUTANEOUS | Status: DC | PRN

## 2015-10-23 MED ORDER — SODIUM CHLORIDE 0.9 % IV SOLN: 100.0000 mL | INTRAVENOUS | Status: DC | PRN

## 2015-10-23 MED ORDER — ALTEPLASE 2 MG IJ SOLR: 2.0000 mg | Freq: Once | INTRAMUSCULAR | Status: DC | PRN

## 2015-10-23 MED ORDER — DARBEPOETIN ALFA 100 MCG/0.5ML IJ SOSY
PREFILLED_SYRINGE | INTRAMUSCULAR | Status: AC
  Filled 2015-10-23: qty 0.5

## 2015-10-23 MED ORDER — HEPARIN SODIUM (PORCINE) 1000 UNIT/ML DIALYSIS: 20.0000 [IU]/kg | INTRAMUSCULAR | Status: DC | PRN

## 2015-10-23 MED ORDER — LIDOCAINE-PRILOCAINE 2.5-2.5 % EX CREA: 1.0000 "application " | TOPICAL_CREAM | CUTANEOUS | Status: DC | PRN

## 2015-10-23 MED ORDER — WARFARIN - PHARMACIST DOSING INPATIENT
Freq: Every day | Status: DC
  Administered 2015-10-27 – 2015-10-28 (×2)

## 2015-10-23 MED ORDER — CARBAMIDE PEROXIDE 6.5 % OT SOLN
5.0000 [drp] | Freq: Two times a day (BID) | OTIC | Status: AC
  Administered 2015-10-23 – 2015-10-27 (×10): 5 [drp] via OTIC
  Filled 2015-10-23: qty 15

## 2015-10-23 MED ORDER — HEPARIN SODIUM (PORCINE) 1000 UNIT/ML DIALYSIS: 1000.0000 [IU] | INTRAMUSCULAR | Status: DC | PRN

## 2015-10-23 MED ORDER — LIDOCAINE HCL (PF) 1 % IJ SOLN: 5.0000 mL | INTRAMUSCULAR | Status: DC | PRN

## 2015-10-23 MED ORDER — WARFARIN SODIUM 5 MG PO TABS
5.0000 mg | ORAL_TABLET | Freq: Once | ORAL | Status: DC
  Filled 2015-10-23: qty 1

## 2015-10-23 NOTE — Progress Notes (Addendum)
Speech Language Pathology Daily Session Note  Patient Details  Name: Isaiah [name] MRN: [id number] Date of Birth: 1966-02-25  Today's Date: 10/23/2015 SLP Individual Time: 1105-1200 SLP Individual Time Calculation (min): 55 min  Short Term Goals: Week 2: SLP Short Term Goal 1 (Week 2): Pt will consume dys 2 textures and thin liquids with minimal overt s/s of aspiration and supervision for use of swallowing precautions over 2 targeted sessions prior to advancement.   SLP Short Term Goal 2 (Week 2): Pt will self-monitor and correct verbal perseveration with mod assist multimodal cues. SLP Short Term Goal 3 (Week 2): Pt will name familiar objects with min assist semantic cues.  SLP Short Term Goal 4 (Week 2): Pt will utilize over articulation for production of phonemes at the CV, CVC/word level for accurate production of vowels and bilabials with Min verbal, visual cues  SLP Short Term Goal 5 (Week 2): Pt will verbalize at the phrase level with mod assist verbal cues for 75% accuracy  Skilled Therapeutic Interventions:  Pt was seen for skilled ST targeting communication goals.  Pt was able to describe pictures at the phrase level with overall mod assist verbal cues for expansion of utterances and phonemic placement.  Pt was able to name pictures of familiar objects for 100% accuracy with overall min-mod assist verbal cues.  Pt required mod-max assist verbal cues for overarticulation when producing words in phrases with ~75% accuracy.  Pt's functional communication improved in a less structured context during conversations with SLP.  Pt endorses feeling frustrated about his communication but understands that it should improve over time and with ongoing ST interventions.  Pt returned to room and left with call bell within reach, quick release belt donned.  Continue per current plan of c are.    Function:  Eating Eating             Cognition Comprehension Comprehension assist level:  Understands basic 75 - 89% of the time/ requires cueing 10 - 24% of the time  Expression   Expression assist level: Expresses basic 50 - 74% of the time/requires cueing 25 - 49% of the time. Needs to repeat parts of sentences.  Social Interaction Social Interaction assist level: Interacts appropriately 90% of the time - Needs monitoring or encouragement for participation or interaction.  Problem Solving Problem solving assist level: Solves basic 75 - 89% of the time/requires cueing 10 - 24% of the time  Memory Memory assist level: Recognizes or recalls 75 - 89% of the time/requires cueing 10 - 24% of the time    Pain Pain Assessment Pain Assessment: No/denies pain Pain Score: 0-No pain  Therapy/Group: Individual Therapy  Tylor Gambrill, Selinda Orion 10/23/2015, 12:56 PM

## 2015-10-23 NOTE — Progress Notes (Signed)
Subjective/Complaints: Eating breakfast without choking or coughing, pt likes his upgraded diet  ROS- appears to deny CP, SOB, N/V/D  Objective: Vital Signs: Blood pressure 123/82, pulse 87, temperature 98.8 F (37.1 C), temperature source Oral, resp. rate 18, height _0  (1.803 m), weight 92.987 kg (205 lb), SpO2 97 %. No results found. Results for orders placed or performed during the hospital encounter of 10/12/15 (from the past 72 hour(s))  Glucose, capillary     Status: Abnormal   Collection Time: 10/20/15 11:41 AM  Result Value Ref Range   Glucose-Capillary 159 (H) 65 - 99 mg/dL  CBC     Status: Abnormal   Collection Time: 10/20/15 12:33 PM  Result Value Ref Range   WBC 16.3 (H) 4.0 - 10.5 K/uL   RBC 3.69 (L) 4.22 - 5.81 MIL/uL   Hemoglobin 10.9 (L) 13.0 - 17.0 g/dL   HCT 32.5 (L) 39.0 - 52.0 %   MCV 88.1 78.0 - 100.0 fL   MCH 29.5 26.0 - 34.0 pg   MCHC 33.5 30.0 - 36.0 g/dL   RDW 17.8 (H) 11.5 - 15.5 %   Platelets 485 (H) 150 - 400 K/uL  Heparin level (unfractionated)     Status: Abnormal   Collection Time: 10/20/15 12:33 PM  Result Value Ref Range   Heparin Unfractionated 0.20 (L) 0.30 - 0.70 IU/mL    Comment:        IF HEPARIN RESULTS ARE BELOW EXPECTED VALUES, AND PATIENT DOSAGE HAS BEEN CONFIRMED, SUGGEST FOLLOW UP TESTING OF ANTITHROMBIN III LEVELS.   Renal function panel     Status: Abnormal   Collection Time: 10/20/15 12:43 PM  Result Value Ref Range   Sodium 133 (L) 135 - 145 mmol/L   Potassium 3.9 3.5 - 5.1 mmol/L   Chloride 97 (L) 101 - 111 mmol/L   CO2 26 22 - 32 mmol/L   Glucose, Bld 175 (H) 65 - 99 mg/dL   BUN 18 6 - 20 mg/dL   Creatinine, Ser 5.07 (H) 0.61 - 1.24 mg/dL   Calcium 8.9 8.9 - 10.3 mg/dL   Phosphorus 3.5 2.5 - 4.6 mg/dL   Albumin 2.7 (L) 3.5 - 5.0 g/dL   GFR calc non Af Amer 12 (L) >60 mL/min   GFR calc Af Amer 14 (L) >60 mL/min    Comment: (NOTE) The eGFR has been calculated using the CKD EPI equation. This calculation has not  been validated in all clinical situations. eGFR's persistently <60 mL/min signify possible Chronic Kidney Disease.    Anion gap 10 5 - 15  CBC     Status: Abnormal   Collection Time: 10/20/15 12:43 PM  Result Value Ref Range   WBC 16.9 (H) 4.0 - 10.5 K/uL   RBC 3.73 (L) 4.22 - 5.81 MIL/uL   Hemoglobin 11.0 (L) 13.0 - 17.0 g/dL   HCT 32.8 (L) 39.0 - 52.0 %   MCV 87.9 78.0 - 100.0 fL   MCH 29.5 26.0 - 34.0 pg   MCHC 33.5 30.0 - 36.0 g/dL   RDW 17.9 (H) 11.5 - 15.5 %   Platelets 476 (H) 150 - 400 K/uL  Glucose, capillary     Status: None   Collection Time: 10/20/15  4:42 PM  Result Value Ref Range   Glucose-Capillary 77 65 - 99 mg/dL  Glucose, capillary     Status: Abnormal   Collection Time: 10/20/15  8:35 PM  Result Value Ref Range   Glucose-Capillary 218 (H) 65 - 99 mg/dL  Heparin level (unfractionated)     Status: Abnormal   Collection Time: 10/20/15 11:47 PM  Result Value Ref Range   Heparin Unfractionated 0.17 (L) 0.30 - 0.70 IU/mL    Comment:        IF HEPARIN RESULTS ARE BELOW EXPECTED VALUES, AND PATIENT DOSAGE HAS BEEN CONFIRMED, SUGGEST FOLLOW UP TESTING OF ANTITHROMBIN III LEVELS.   Glucose, capillary     Status: None   Collection Time: 10/21/15  7:01 AM  Result Value Ref Range   Glucose-Capillary 91 65 - 99 mg/dL  CBC     Status: Abnormal   Collection Time: 10/21/15  7:57 AM  Result Value Ref Range   WBC 17.3 (H) 4.0 - 10.5 K/uL   RBC 3.73 (L) 4.22 - 5.81 MIL/uL   Hemoglobin 10.7 (L) 13.0 - 17.0 g/dL   HCT 32.5 (L) 39.0 - 52.0 %   MCV 87.1 78.0 - 100.0 fL   MCH 28.7 26.0 - 34.0 pg   MCHC 32.9 30.0 - 36.0 g/dL   RDW 17.5 (H) 11.5 - 15.5 %   Platelets 482 (H) 150 - 400 K/uL  Heparin level (unfractionated)     Status: None   Collection Time: 10/21/15  7:57 AM  Result Value Ref Range   Heparin Unfractionated 0.37 0.30 - 0.70 IU/mL    Comment:        IF HEPARIN RESULTS ARE BELOW EXPECTED VALUES, AND PATIENT DOSAGE HAS BEEN CONFIRMED, SUGGEST FOLLOW UP  TESTING OF ANTITHROMBIN III LEVELS.   Glucose, capillary     Status: Abnormal   Collection Time: 10/21/15 11:29 AM  Result Value Ref Range   Glucose-Capillary 207 (H) 65 - 99 mg/dL  Glucose, capillary     Status: Abnormal   Collection Time: 10/21/15  4:31 PM  Result Value Ref Range   Glucose-Capillary 170 (H) 65 - 99 mg/dL  Heparin level (unfractionated)     Status: None   Collection Time: 10/21/15  4:38 PM  Result Value Ref Range   Heparin Unfractionated 0.31 0.30 - 0.70 IU/mL    Comment:        IF HEPARIN RESULTS ARE BELOW EXPECTED VALUES, AND PATIENT DOSAGE HAS BEEN CONFIRMED, SUGGEST FOLLOW UP TESTING OF ANTITHROMBIN III LEVELS.   Glucose, capillary     Status: Abnormal   Collection Time: 10/21/15  9:16 PM  Result Value Ref Range   Glucose-Capillary 243 (H) 65 - 99 mg/dL  CBC     Status: Abnormal   Collection Time: 10/22/15  6:10 AM  Result Value Ref Range   WBC 15.7 (H) 4.0 - 10.5 K/uL   RBC 3.62 (L) 4.22 - 5.81 MIL/uL   Hemoglobin 10.7 (L) 13.0 - 17.0 g/dL   HCT 31.3 (L) 39.0 - 52.0 %   MCV 86.5 78.0 - 100.0 fL   MCH 29.6 26.0 - 34.0 pg   MCHC 34.2 30.0 - 36.0 g/dL   RDW 17.4 (H) 11.5 - 15.5 %   Platelets 443 (H) 150 - 400 K/uL  Heparin level (unfractionated)     Status: None   Collection Time: 10/22/15  6:10 AM  Result Value Ref Range   Heparin Unfractionated 0.35 0.30 - 0.70 IU/mL    Comment:        IF HEPARIN RESULTS ARE BELOW EXPECTED VALUES, AND PATIENT DOSAGE HAS BEEN CONFIRMED, SUGGEST FOLLOW UP TESTING OF ANTITHROMBIN III LEVELS.   Glucose, capillary     Status: Abnormal   Collection Time: 10/22/15  6:55 AM  Result Value  Ref Range   Glucose-Capillary 126 (H) 65 - 99 mg/dL  Glucose, capillary     Status: Abnormal   Collection Time: 10/22/15 11:43 AM  Result Value Ref Range   Glucose-Capillary 231 (H) 65 - 99 mg/dL  Glucose, capillary     Status: Abnormal   Collection Time: 10/22/15  4:48 PM  Result Value Ref Range   Glucose-Capillary 168 (H) 65  - 99 mg/dL  Glucose, capillary     Status: Abnormal   Collection Time: 10/22/15  8:39 PM  Result Value Ref Range   Glucose-Capillary 199 (H) 65 - 99 mg/dL  Glucose, capillary     Status: None   Collection Time: 10/23/15  6:36 AM  Result Value Ref Range   Glucose-Capillary 91 65 - 99 mg/dL    General: No acute distress. Vital signs reviewed Psych: Mood and affect are appropriate Heart: Regular rate and rhythm  Lungs: Clear to auscultation, breathing unlabored, no rales or wheezes Abdomen: Positive bowel sounds, soft nontender to palpation, nondistended Skin: No evidence of breakdown, no evidence of rash Neurologic:  Motor  RUE/RLE: 5/5 proximal distal LUE: 4 -/5 proximal distal LLE: hip flexion, knee extension 4/5, 3-/5 ankle dorsi/plantar flexion MAS: Right wrist flexion 1+/5, elbow flexion 1/5 Musculoskeletal: No TTP. No joint swelling. No edema Able to name stethoscope Assessment/Plan: 1. 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 Physiatrist is providing close team supervision and 24 hour management of active medical problems listed below. Physiatrist and rehab team continue to assess barriers to discharge/monitor patient progress toward functional and medical goals. FIM: Function - Bathing Position: Wheelchair/chair at sink Body parts bathed by patient: Right arm, Chest, Abdomen, Front perineal area, Buttocks, Right upper leg, Left upper leg, Right lower leg, Left lower leg Body parts bathed by helper: Back, Left arm Bathing not applicable: Front perineal area, Buttocks, Right upper leg, Left upper leg, Right lower leg, Left lower leg Assist Level:  (min A)  Function- Upper Body Dressing/Undressing What is the patient wearing?: Pull over shirt/dress Pull over shirt/dress - Perfomed by patient:  Thread/unthread right sleeve, Thread/unthread left sleeve, Put head through opening Pull over shirt/dress - Perfomed by helper: Pull shirt over trunk Assist Level:  (mod A) Function - Lower Body Dressing/Undressing What is the patient wearing?: Pants, Socks, Shoes Position: Wheelchair/chair at sink Pants- Performed by patient: Thread/unthread left pants leg, Thread/unthread right pants leg, Pull pants up/down Pants- Performed by helper: Thread/unthread right pants leg, Pull pants up/down Non-skid slipper socks- Performed by helper: Don/doff right sock, Don/doff left sock Socks - Performed by patient: Don/doff right sock, Don/doff left sock Shoes - Performed by patient: Don/doff right shoe, Don/doff left shoe Assist for footwear: Dependant Assist for lower body dressing: Touching or steadying assistance (Pt > 75%)  Function - Toileting Toileting steps completed by helper: Adjust clothing prior to toileting, Performs perineal hygiene, Adjust clothing after toileting Toileting Assistive Devices: Grab bar or rail Assist level: Two helpers  Function - Air cabin crew transfer assistive device: Grab bar Assist level to toilet: Moderate assist (Pt 50 - 74%/lift or lower) Assist level from toilet: Moderate assist (Pt 50 - 74%/lift or lower)  Function - Chair/bed transfer Chair/bed transfer method: Stand pivot Chair/bed transfer assist level: Touching or steadying assistance (Pt > 75%) Chair/bed transfer assistive device: Armrests Chair/bed transfer details: Verbal cues for precautions/safety, Manual facilitation for weight  shifting  Function - Locomotion: Wheelchair Will patient use wheelchair at discharge?: No Type: Manual Max wheelchair distance: 150 Assist Level: Touching or steadying assistance (Pt > 75%) Wheel 50 feet with 2 turns activity did not occur: Safety/medical concerns Assist Level: Touching or steadying assistance (Pt > 75%) Wheel 150 feet activity did not occur:  Safety/medical concerns Assist Level: Touching or steadying assistance (Pt > 75%) Turns around,maneuvers to table,bed, and toilet,negotiates 3% grade,maneuvers on rugs and over doorsills: No Function - Locomotion: Ambulation Ambulation activity did not occur: Safety/medical concerns Assistive device: Walker-rolling, No device Max distance: 140 Assist level: Touching or steadying assistance (Pt > 75%) Assist level: Touching or steadying assistance (Pt > 75%) Assist level: Touching or steadying assistance (Pt > 75%) Walk 150 feet activity did not occur: Safety/medical concerns Walk 10 feet on uneven surfaces activity did not occur: Safety/medical concerns  Function - Comprehension Comprehension: Auditory Comprehension assist level: Understands basic 75 - 89% of the time/ requires cueing 10 - 24% of the time  Function - Expression Expression: Verbal Expression assist level: Expresses basic 50 - 74% of the time/requires cueing 25 - 49% of the time. Needs to repeat parts of sentences.  Function - Social Interaction Social Interaction assist level: Interacts appropriately 90% of the time - Needs monitoring or encouragement for participation or interaction.  Function - Problem Solving Problem solving assist level: Solves basic 50 - 74% of the time/requires cueing 25 - 49% of the time  Function - Memory Memory assist level: Recognizes or recalls 50 - 74% of the time/requires cueing 25 - 49% of the time Patient normally able to recall (first 3 days only): That he or she is in a hospital, Staff names and faces  Medical Problem List and Plan: 1. Right hemiparesis and aphasia secondary to bi-cerebral embolic infarcts (L>R)- ask neuro eval once imaging from [facility] available   2. PE/DVT/Stoke/Anticoagulation: Pharmaceutical: Lovenox bid per records from [place].  use iv heparin gtt at this time. per hematologist Dr Alen Blew ?warfarin-will need to make decision prior to d/c 2/24 3. Pain  Management: Ultram prn.  4. Mood: LCSW to follow for evaluation and support.  5. Neuropsych: This patient is not fully capable of making decisions on his own behalf. 6. Skin/Wound Care: Routine pressure relief measures.  7. Fluids/Electrolytes/Nutrition: Renal restrictions. 1200 cc/FR.  8. DM type 2: Monitor BS ac/hs. Continue levemir and use SSI for elevated BS.   Fasting CBG 91- acceptable  continue current dose Levemir   9. HTN: Monitor BP bid and titrate medications as needed. Elevated at admission. Parameters on Midodrine.  10. ESRD on HD: per [facility]. Continue diet/volume mgt, Tunnel dialysis catheter Placed. Appreciate nephrology recs 11. Leukocytosis: no breathing difficulties or coughing. UA negative except for elevated protein as expected from renal failure    12.  Hx of GI bleed will monitor for reurrence,Hgb stable at 10.7 on 2/12  13. Dysphagia- Per speech therapy, on D2 solids, thin liq, ? Upgrade to D3  LOS (Days) 11 A FACE TO FACE EVALUATION WAS PERFORMED  KIRSTEINS,ANDREW E 10/23/2015, 8:13 AM

## 2015-10-23 NOTE — Progress Notes (Signed)
Occupational Therapy Session Note  Patient Details  Name: Isaiah [name] MRN: [id number] Date of Birth: April 14, 1966  Today's Date: 10/23/2015 OT Individual Time: 6962-9528 OT Individual Time Calculation (min): 57 min    Short Term Goals: Week 2:  OT Short Term Goal 1 (Week 2): Pt will complete toilet transfer with min assist OT Short Term Goal 2 (Week 2): Pt will complete LB dressing with mod assist OT Short Term Goal 3 (Week 2): Pt will complete UB dressing with min assist OT Short Term Goal 4 (Week 2): Pt will engage in functional activity in standing for 1 minute with min asisst OT Short Term Goal 5 (Week 2): Pt will use the LUE as an active assist for bathing tasks with mod assist.    Skilled Therapeutic Interventions/Progress Updates:   Pt completed self feeding sitting unsupported EOB during session.  He was able to utilize the RUE with foam grip over utensil with mod to max assist throughout to scoop food and bring to mouth.  Once feeding was completed he donned slip on shoes EOB and then transferred to the wheelchair with min assist and use of the RW for support.  Pt completed brushing his teeth in sitting with mod hand over hand assist with foam grip on the toothbrush.  Mod instructional cueing for coordination of holding toothbrush and applying toothpaste.  He was able to finish session with ambulation out in the hall and then back to his wheelchair greater than 75 ft with min assist and use of the RW with hand splint on the right side.     Therapy Documentation Precautions:  Precautions Precautions: Fall Precaution Comments: expressive/ receptive difficulties Restrictions Weight Bearing Restrictions: No  Pain: Pain Assessment Pain Assessment: No/denies pain Pain Score: 0-No pain ADL: See Function Navigator for Current Functional Status.   Therapy/Group: Individual Therapy  Trinity Hyland OTR/L 10/23/2015, 12:19 PM

## 2015-10-23 NOTE — Progress Notes (Signed)
Assessment:  1. New ESRD  2. Embolic CVA/heparin infusion 3. Anemia on aranesp 4. Sec HPTH, PTH 211 5. Hypotension improved 6. Hx MGUS  Plan: 1. HD tuesday 2. Plan to go to [name] after DC. TTS 2nd shift. 3. will stop midodrine  Subjective: Interval History: none.  Objective: Vital signs in last 24 hours: Temp:  [98.2 F (36.8 C)-98.8 F (37.1 C)] 98.8 F (37.1 C) (02/14 0422) Pulse Rate:  [87-90] 87 (02/14 0422) Resp:  [18] 18 (02/14 0422) BP: (123-133)/(82-85) 123/82 mmHg (02/14 0422) SpO2:  [97 %-99 %] 97 % (02/14 1223) Weight:  [92.987 kg (205 lb)] 92.987 kg (205 lb) (02/14 0422) Weight change:   Intake/Output from previous day: 02/13 0701 - 02/14 0700 In: 74 [P.O.:460] Out: 600 [Urine:600] Intake/Output this shift: Total I/O In: 240 [P.O.:240] Out: -   General appearance: dysarthric speech Resp: clear to auscultation bilaterally Chest wall: no tenderness Cardio: regular rate and rhythm, S1, S2 normal, no murmur, click, rub or gallop Extremities: extremities normal, atraumatic, no cyanosis or edema  Lab Results:  Recent Labs  10/22/15 0610 10/23/15 0946  WBC 15.7* 15.3*  HGB 10.7* 10.6*  HCT 31.3* 31.9*  PLT 443* 476*   BMET:  Recent Labs  10/20/15 1243  NA 133*  K 3.9  CL 97*  CO2 26  GLUCOSE 175*  BUN 18  CREATININE 5.07*  CALCIUM 8.9   No results for input(s): PTH in the last 72 hours. Iron Studies: No results for input(s): IRON, TIBC, TRANSFERRIN, FERRITIN in the last 72 hours. Studies/Results: No results found.  Scheduled: . antiseptic oral rinse  7 mL Mouth Rinse BID  . aspirin EC  81 mg Oral Daily  . carbamide peroxide  5 drop Both Ears BID  . darbepoetin (ARANESP) injection - DIALYSIS  100 mcg Intravenous Q Tue-HD  . fluticasone furoate-vilanterol  1 puff Inhalation Daily  . insulin aspart  0-5 Units Subcutaneous QHS  . insulin aspart  0-9 Units Subcutaneous TID WC  . insulin detemir  20 Units Subcutaneous QHS  . magic  mouthwash w/lidocaine  5 mL Oral QID  . midodrine  2.5 mg Oral BID WC  . montelukast  10 mg Oral QHS  . multivitamin  1 tablet Oral QHS  . pantoprazole  40 mg Oral BID     LOS: 11 days   Isaiah [name] C 10/23/2015,12:32 PM

## 2015-10-23 NOTE — Progress Notes (Signed)
Had discussion regarding anticoagulation with Dr. Alen Blew who felt it was reasonable to start coumadin for stroke prophylaxis and with close monitoring of INR. Will order with INR goal 2 - 2.5 as per Dr. Phoebe Sharps recommendations.

## 2015-10-23 NOTE — Progress Notes (Signed)
ANTICOAGULATION CONSULT NOTE - Follow Up Consult  Pharmacy Consult for heparin Indication: hx of DVT/PE/stroke  Allergies  Allergen Reactions  . Codeine Other (See Comments)    Pt unknown  . Codeine Rash and Other (See Comments)    Unknown reaction (patient says it was more serious than just a rash, but he can't remember what happened)    Patient Measurements: Height: 5' 11"  (180.3 cm) Weight: 205 lb (92.987 kg) IBW/kg (Calculated) : 75.3 Heparin Dosing Weight:   Vital Signs: Temp: 98.8 F (37.1 C) (02/14 0422) Temp Source: Oral (02/14 0422) BP: 123/82 mmHg (02/14 0422) Pulse Rate: 87 (02/14 0422)  Labs:  Recent Labs  10/20/15 1243  10/21/15 0757 10/21/15 1638 10/22/15 0610  HGB 11.0*  --  10.7*  --  10.7*  HCT 32.8*  --  32.5*  --  31.3*  PLT 476*  --  482*  --  443*  HEPARINUNFRC  --   < > 0.37 0.31 0.35  CREATININE 5.07*  --   --   --   --   < > = values in this interval not displayed.  Estimated Creatinine Clearance: 20.1 mL/min (by C-G formula based on Cr of 5.07).   Medications:  Scheduled:  . antiseptic oral rinse  7 mL Mouth Rinse BID  . aspirin EC  81 mg Oral Daily  . darbepoetin (ARANESP) injection - DIALYSIS  100 mcg Intravenous Q Tue-HD  . fluticasone furoate-vilanterol  1 puff Inhalation Daily  . insulin aspart  0-5 Units Subcutaneous QHS  . insulin aspart  0-9 Units Subcutaneous TID WC  . insulin detemir  20 Units Subcutaneous QHS  . magic mouthwash w/lidocaine  5 mL Oral QID  . midodrine  2.5 mg Oral BID WC  . montelukast  10 mg Oral QHS  . multivitamin  1 tablet Oral QHS  . pantoprazole  40 mg Oral BID   Infusions:  . sodium chloride 10 mL/hr at 10/18/15 2157  . heparin 1,800 Units/hr (10/22/15 1152)    Assessment: 50 yo male with hx of DVT/PE/stroke is currently on therapeutic heparin.  Heparin level is 0.41.  Hgb is 10.6 and Plt is 476 K.  Goal of Therapy:  Heparin level 0.3-0.5  Monitor platelets by anticoagulation protocol:  Yes   Plan:  - continue hepairn at 1800 units/hr - daily heparin level and CBC  Latissa Frick, Tsz-Yin 10/23/2015,8:35 AM

## 2015-10-23 NOTE — Progress Notes (Signed)
ANTICOAGULATION CONSULT NOTE - Follow Up Consult  Pharmacy Consult for heparin / warfarin Indication: hx of DVT/PE/stroke  Allergies  Allergen Reactions  . Codeine Other (See Comments)    Pt unknown  . Codeine Rash and Other (See Comments)    Unknown reaction (patient says it was more serious than just a rash, but he can't remember what happened)    Patient Measurements: Height: 5' 11"  (180.3 cm) Weight: 206 lb 2.1 oz (93.5 kg) IBW/kg (Calculated) : 75.3   Vital Signs: Temp: 97.4 F (36.3 C) (02/14 1530) Temp Source: Oral (02/14 1530) BP: 140/87 mmHg (02/14 1700) Pulse Rate: 83 (02/14 1700)  Labs:  Recent Labs  10/21/15 1638 10/22/15 0610 10/23/15 0946 10/23/15 1530  HGB  --  10.7* 10.6* 9.6*  HCT  --  31.3* 31.9* 28.1*  PLT  --  443* 476* 449*  HEPARINUNFRC 0.31 0.35 0.41  --   CREATININE  --   --   --  5.66*    Estimated Creatinine Clearance: 18 mL/min (by C-G formula based on Cr of 5.66).   Medications:  Scheduled:  . antiseptic oral rinse  7 mL Mouth Rinse BID  . aspirin EC  81 mg Oral Daily  . carbamide peroxide  5 drop Both Ears BID  . darbepoetin (ARANESP) injection - DIALYSIS  100 mcg Intravenous Q Tue-HD  . fluticasone furoate-vilanterol  1 puff Inhalation Daily  . insulin aspart  0-5 Units Subcutaneous QHS  . insulin aspart  0-9 Units Subcutaneous TID WC  . insulin detemir  20 Units Subcutaneous QHS  . magic mouthwash w/lidocaine  5 mL Oral QID  . midodrine  2.5 mg Oral BID WC  . montelukast  10 mg Oral QHS  . multivitamin  1 tablet Oral QHS  . pantoprazole  40 mg Oral BID   Infusions:  . sodium chloride 10 mL/hr at 10/18/15 2157  . heparin 1,800 Units/hr (10/22/15 1152)    Assessment: 50 yo male with hx of DVT/PE/stroke is currently on therapeutic heparin.  Heparin level is 0.41.  Hgb is 10.6 and Plt is 476 K.  Plan to start warfarin for long term AC.  Has had irregular INR in past and some bleeding issues.  Will run at lown end  goal.  Goal of Therapy:  Heparin level 0.3-0.5 INR 2-2.5  Monitor platelets by anticoagulation protocol: Yes   Plan:  - continue hepairn at 1800 units/hr - daily heparin level and CBC -warfarin 102m x1 today -Daily INR  LBonnita NasutiPharm.D. CPP, BCPS Clinical Pharmacist [phone]/14/2017 5:19 PM

## 2015-10-23 NOTE — Consult Note (Addendum)
STROKE NEUROLOGY CONSULTATION NOTE  Referring Physician: Reesa Chew, PA    Chief Complaint: stroke in the setting of recurrent DVT, PE and PFO  HPI: Isaiah [name] is an 50 y.o. male with very complicated PMH of DM type 2 with diabetic neuropathy, Sickle cell-thalassemia,  DVT and PE x 2 in 07/2011 and 1/ 2016 off coumadin due to GIB, OSA, CKD stage V, MGUS and anemia following with Dr. Alen Blew.  He was recently admitted to [facility] in [place] on 09/16/15 with N/V and blood stools with acute on chronic renal failure due to acute pancreatitis. He was treated with supportive care, IV bicarb as well as multiple units PRBC and EGD with severe stage IV erosive esophagitis with gastritis.HD initiated on 01/09 due to poor recover with decrease in UOP. He developed hypoxic respiratory failure and was found to have RUL/RML PE as well as left popliteal DVT. He was treated with IV heparin. IVC filter was placed around 09/21/15 and post procedure was found to have right hand weakness.   CT on 09/26/15 reported possible subacut right MCA/PCA and left cerebellar infarcts which were not correlate to his right hand weakness. Neurology was consulted for input and EMG showed evidence of profound neuropathy. He had worsening of symptoms with increase in right sided weakness, facial droop speech difficulty on 09/27/15. MRI on 09/29/15 showedscattered right MCA and left MCA, left thalamus and b/l cerebellar infarcts. The infarcts on CT 09/26/15 likely to be acute too. On 09/30/15, he had worsening of aphasia and repeat MRI showed extension of left MCA frontal infarcts. MRA head showed left M2 superior branch occlusion. Echo with positive bubble study and TEE positive for PFO. Hypercoagulation studies reported to be negative. IV heparin was changed to Lovenox due to recurrent embolic strokes on heparin.   He was then transferred here to [facility] rehab on 10/12/15. He was reported to have  small amount of blood in stool on admission. Continued HD for CKD after admission. Due to CKD on HD, his lovenox discontinued and put back on heparin IV. His hematologist Dr. Alen Blew was consulted for anticoagulation use. He commented "As far as long-term anticoagulation, I feel be reasonable to consider aspirin alone and hope that IVC filter will prevent any future embolic strokes from occurring. An alternative strategy is to consider warfarin and follow closely for signs or symptoms of bleeding." since admission, pt has made significant improvement on his aphasia and right side weakness, currently able to walk with assistance and speak much more fluent. His H&H stable and no obvious bleeding reported. Neurology was consulted for stroke prevention.    Past Medical History  Diagnosis Date  . Diabetes mellitus without complication ([place])   . Sickle cell-thalassemia disease ([place])     a. Sickle cell trait.  . Pulmonary embolism ([place]) 07/2011    a. Tx with Coumadin for 6 months (unknown cause per patient).  . Hyperkalemia 07/2011  . Acute renal failure ([place]) 07/2011  . Monoclonal gammopathy   . Sleep apnea     a. uses CPAP.  [name] Hypertension   . Gout   . Pulmonary embolism ([place]) 07/2011; 09/27/2014    a. Bilat PE 07/2011 - unclear cause, tx with 6 months Coumadin.;   . Monoclonal gammopathies   . Anemia   . Asthma   . OSA on CPAP   . History of recent blood transfusion 10/27/14    2 Units PRBC's  . ESRD (end stage renal disease) on dialysis (  [facility])     Past Surgical History  Procedure Laterality Date  . Cholecystectomy    . Bone marrow biopsy    . Other surgical history      Retinal surgery  . Pars plana vitrectomy  02/17/2012    Procedure: PARS PLANA VITRECTOMY WITH 25 GAUGE;  Surgeon: Hayden Pedro, MD;  Location: [place];  Service: Ophthalmology;  Laterality: Right;  . Cholecystectomy  1990's?  . Eye surgery Right   . Insertion of dialysis catheter Right 10/16/2015    Procedure: INSERTION  OF PALINDROME DIALYSIS CATHETER ;  Surgeon: Elam Dutch, MD;  Location: [place];  Service: Vascular;  Laterality: Right;    Family History  Problem Relation Age of Onset  . Hypertension Mother   . Diabetes Mother   . Sickle cell anemia Brother   . Diabetes type I Brother   . Kidney disease Brother    Social History:  reports that he has never smoked. He has never used smokeless tobacco. He reports that he does not drink alcohol or use illicit drugs.  Allergies:  Allergies  Allergen Reactions  . Codeine Other (See Comments)    Pt unknown  . Codeine Rash and Other (See Comments)    Unknown reaction (patient says it was more serious than just a rash, but he can't remember what happened)    Medications: I have reviewed the patient's current medications.  [name] antiseptic oral rinse  7 mL Mouth Rinse BID  . aspirin EC  81 mg Oral Daily  . carbamide peroxide  5 drop Both Ears BID  . darbepoetin (ARANESP) injection - DIALYSIS  100 mcg Intravenous Q Tue-HD  . fluticasone furoate-vilanterol  1 puff Inhalation Daily  . insulin aspart  0-5 Units Subcutaneous QHS  . insulin aspart  0-9 Units Subcutaneous TID WC  . insulin detemir  20 Units Subcutaneous QHS  . magic mouthwash w/lidocaine  5 mL Oral QID  . midodrine  2.5 mg Oral BID WC  . montelukast  10 mg Oral QHS  . multivitamin  1 tablet Oral QHS  . pantoprazole  40 mg Oral BID    ROS: Review of Systems: ROS was attempted today and was able to be performed.  Systems assessed include - Constitutional, Eyes,  HENT, Respiratory, Cardiovascular, Gastrointestinal, Genitourinary, Integument/breast, Hematologic/lymphatic, Musculoskeletal, Neurological, Behavioral/Psych, Endocrine,  Allergic/Immunologic - the patient complains of only the following symptoms, and all other reviewed systems are negative.   Physical Examination: Blood pressure 129/83, pulse 83, temperature 98.3 F (36.8 C), temperature source Oral, resp. rate 18, height 5'  11" (1.803 m), weight 205 lb (92.987 kg), SpO2 96 %.   Physical exam  Temp:  [98.3 F (36.8 C)-98.8 F (37.1 C)] 98.3 F (36.8 C) (02/14 1336) Pulse Rate:  [83-87] 83 (02/14 1336) Resp:  [18] 18 (02/14 1336) BP: (123-129)/(82-83) 129/83 mmHg (02/14 1336) SpO2:  [96 %-97 %] 96 % (02/14 1336) Weight:  [205 lb (92.987 kg)] 205 lb (92.987 kg) (02/14 0422)  General - Well nourished, well developed, in no apparent distress.  Ophthalmologic - Fundi not visualized due to noncooperation.  Cardiovascular - Regular rate and rhythm.  Mental Status -  Level of arousal and orientation to place and person were intact, but not able to orientated to time or  president. Language including repetition and comprehension was assessed and found intact, however with mild perseveration and speech hesitation. Naming 2/4.  Fund of Knowledge was assessed and was impaired.  Cranial Nerves II -  XII - II - Visual field intact OU. III, IV, VI - Extraocular movements intact. V - Facial sensation intact bilaterally. VII - right facial droop. VIII - Hearing & vestibular intact bilaterally. X - Palate elevates symmetrically. XI - Chin turning & shoulder shrug intact bilaterally. XII - Tongue protrusion intact.  Motor Strength - The patient's strength was 4/5 RUE and 5-/5 RLE and pronator drift was present on the right. Left UE and LLE 5/5.  Bulk was normal and fasciculations were absent.   Motor Tone - Muscle tone was assessed at the neck and appendages and was normal.  Reflexes - The patient's reflexes were 1+ in all extremities and he had no pathological reflexes.  Sensory - Light touch, temperature/pinprick were assessed and were decreased on the RUE 60% of left, BLE symmetrical.    Coordination - The patient had normal movements in the hands and feet with no ataxia or dysmetria.  Tremor was absent.  Gait and Station - deferred to PT due to safety concerns.    Results for orders placed or performed  during the hospital encounter of 10/12/15 (from the past 48 hour(s))  Glucose, capillary     Status: Abnormal   Collection Time: 10/21/15  4:31 PM  Result Value Ref Range   Glucose-Capillary 170 (H) 65 - 99 mg/dL  Heparin level (unfractionated)     Status: None   Collection Time: 10/21/15  4:38 PM  Result Value Ref Range   Heparin Unfractionated 0.31 0.30 - 0.70 IU/mL    Comment:        IF HEPARIN RESULTS ARE BELOW EXPECTED VALUES, AND PATIENT DOSAGE HAS BEEN CONFIRMED, SUGGEST FOLLOW UP TESTING OF ANTITHROMBIN III LEVELS.   Glucose, capillary     Status: Abnormal   Collection Time: 10/21/15  9:16 PM  Result Value Ref Range   Glucose-Capillary 243 (H) 65 - 99 mg/dL  CBC     Status: Abnormal   Collection Time: 10/22/15  6:10 AM  Result Value Ref Range   WBC 15.7 (H) 4.0 - 10.5 K/uL   RBC 3.62 (L) 4.22 - 5.81 MIL/uL   Hemoglobin 10.7 (L) 13.0 - 17.0 g/dL   HCT 31.3 (L) 39.0 - 52.0 %   MCV 86.5 78.0 - 100.0 fL   MCH 29.6 26.0 - 34.0 pg   MCHC 34.2 30.0 - 36.0 g/dL   RDW 17.4 (H) 11.5 - 15.5 %   Platelets 443 (H) 150 - 400 K/uL  Heparin level (unfractionated)     Status: None   Collection Time: 10/22/15  6:10 AM  Result Value Ref Range   Heparin Unfractionated 0.35 0.30 - 0.70 IU/mL    Comment:        IF HEPARIN RESULTS ARE BELOW EXPECTED VALUES, AND PATIENT DOSAGE HAS BEEN CONFIRMED, SUGGEST FOLLOW UP TESTING OF ANTITHROMBIN III LEVELS.   Glucose, capillary     Status: Abnormal   Collection Time: 10/22/15  6:55 AM  Result Value Ref Range   Glucose-Capillary 126 (H) 65 - 99 mg/dL  Glucose, capillary     Status: Abnormal   Collection Time: 10/22/15 11:43 AM  Result Value Ref Range   Glucose-Capillary 231 (H) 65 - 99 mg/dL  Glucose, capillary     Status: Abnormal   Collection Time: 10/22/15  4:48 PM  Result Value Ref Range   Glucose-Capillary 168 (H) 65 - 99 mg/dL  Glucose, capillary     Status: Abnormal   Collection Time: 10/22/15  8:39 PM  Result Value Ref  Range    Glucose-Capillary 199 (H) 65 - 99 mg/dL  Glucose, capillary     Status: None   Collection Time: 10/23/15  6:36 AM  Result Value Ref Range   Glucose-Capillary 91 65 - 99 mg/dL  CBC     Status: Abnormal   Collection Time: 10/23/15  9:46 AM  Result Value Ref Range   WBC 15.3 (H) 4.0 - 10.5 K/uL   RBC 3.72 (L) 4.22 - 5.81 MIL/uL   Hemoglobin 10.6 (L) 13.0 - 17.0 g/dL   HCT 31.9 (L) 39.0 - 52.0 %   MCV 85.8 78.0 - 100.0 fL   MCH 28.5 26.0 - 34.0 pg   MCHC 33.2 30.0 - 36.0 g/dL   RDW 17.5 (H) 11.5 - 15.5 %   Platelets 476 (H) 150 - 400 K/uL  Heparin level (unfractionated)     Status: None   Collection Time: 10/23/15  9:46 AM  Result Value Ref Range   Heparin Unfractionated 0.41 0.30 - 0.70 IU/mL    Comment:        IF HEPARIN RESULTS ARE BELOW EXPECTED VALUES, AND PATIENT DOSAGE HAS BEEN CONFIRMED, SUGGEST FOLLOW UP TESTING OF ANTITHROMBIN III LEVELS.   Glucose, capillary     Status: Abnormal   Collection Time: 10/23/15 11:51 AM  Result Value Ref Range   Glucose-Capillary 221 (H) 65 - 99 mg/dL   I have personally reviewed the radiological images from [facility] system and documented in HPI.   Assessment: 50 y.o. male very complicated PMH of DM type 2 with diabetic neuropathy, Sickle cell - thalassemia,  DVT and PE x 2 in 07/2011 and 1/ 2016 off coumadin due to GIB, OSA, CKD stage V, MGUS and anemia following with Dr. Alen Blew was admitted to rehab after recent admission in [place] due to acute pancreatitis, GIB, acute on chronic kidney failure in. However, during [place] admission was found to have DVT and PE, put on heparin and IVC filter. However, he continued to developed embolic stroke causing aphasia and right sided hemiparesis. TEE found positive PFO. His heparin was changed to lovenox. Since admission to our rehab, lovenox changed to heparin drip due to CKD on HD, continued HD, and Dr. Alen Blew consulted who agreed to continue heparin IV but may consider either ASA or coumadin  for long term anticoagulation.   Decision for long term anticoagulation is very complicated and difficulty in this case. On the one hand, he had recurrent DVT and PE x 3, positive PFO, developed embolic stroke even after IVC placement, he needs long term anticoagulation; however, on the other hand, he has GIB on coumadin, EGD showed severe stage IV erosive esophagitis with gastritis, sickle cell - thalassemia, chronic MGUS with anemia, he may not be a good candidate for long term anticoagulation. I agree with Dr. Alen Blew that the medical decision on this is extreme complex.   What I think is that he has been on heparin drip this time for more than one month, and so far no significant bleeding and H&H stable, I would recommend to transition from heparin IV to coumadin with goal INR 2.0-2.5 to give him protection in combination of IVC filter. In the meantime, I will recommend referral to Dr. Burt Knack at Cardiology as outpt to consider PFO closure. In case he is not tolerating coumadin again in the future, we can have him off coumadin but have stroke prevented hopefully.  I will have Dr. Leonie Man to see him again next week to provide second opinion. His wife  is working with Dr. Clydene Fake wife in [facility]. Mom and pt are prefer to continue follow up with Dr. Leonie Man as outpt at this time. I can be available if Dr. Leonie Man not able to see him in the clinic due to scheduling issue.   To complete stroke work up, I would like to recommend to check carotid doppler and 2D echo as well as lipid panel and A1C.   Plan: - HgbA1c, fasting lipid panel - Echocardiogram - Carotid dopplers - continue PT/OT/speech - recommend to transition from heparin IV to coumadin with goal INR 2.0-2.5 in combination of IVC filter. In the meantime, I will recommend referral to Dr. Burt Knack at Cardiology as outpt to consider PFO closure. In case he is not tolerating coumadin again in the future, we can have him off coumadin but have  stroke prevented hopefully. - will have Dr. Leonie Man to see him again next week to provide second opinion.   The patient is extremely complicated and medical decision is very complicated. I have personally obtained a history, examined the patient, evaluated laboratory data, individually viewed imaging studies. I obtained additional history from pt's mom at bedside. I also discussed with Ms. Love PA regarding his care plan. A total of 60 minutes was spent face-to-face with this patient. Over half this time was spent on counseling patient on the stroke prevention and anticoagulation management.    Rosalin Hawking, MD PhD Stroke Neurology 10/23/2015 4:10 PM

## 2015-10-23 NOTE — Progress Notes (Signed)
Events noted on the last few days. He continues to be on heparin without any signs or symptoms of bleeding.  Regarding long-term anticoagulation, there is no simple answer. Any decision will have considerable risk regardless. Without anticoagulation, risk of thrombosis and further strokes is reasonably high which could be rather devastating.  With full dose anticoagulation, he had multiple bleeding episodes although none of them have been life threatening.  It would be reasonable to use warfarin at this time and I agree with Dr. Erlinda Hong regarding using a lower goal of INR with lower threshold to discontinuation if serious bleeding doesn't occur.  Closure of his PFO might help the anticoagulation decision in the future as well.

## 2015-10-23 NOTE — Progress Notes (Addendum)
Physical Therapy Session Note  Patient Details  Name: Isaiah [name] [facility] MRN: [id number] Date of Birth: 08/21/1966  Today's Date: 10/23/2015 PT Individual Time: 1000-1030 PT Individual Time Calculation (min): 30 min    Skilled Therapeutic Interventions/Progress Updates:    Pt completed BERG balance test, 26/56, high fall risk. Pt educated on meaning of test and PT recommendation to use RW at all times, pt agrees.  W/c mobility with B LEs with min A, pt with more difficulty problem solving obstacles today.     Session 2: 9794-8016 45 minutes  1:1 gait with RW with close supervision in controlled environment, min A for turning due to delayed balance reactions. Gait with obstacle negotiation with focus on turning and attention to obstacles, pt requires min A for turns and min cuing to avoid obstacles.  Stair negotiation with 1 railing to simulate home set up, pt requires max A to ascend stairs, total A to descend with +2 assist needed to to L LE buckling.  Pt frustrated by decreased performance on stairs, PT encouraged pt and tried to emphasize improvements with walking.  Therapy Documentation Precautions:  Precautions Precautions: Fall Precaution Comments: expressive/ receptive difficulties Restrictions Weight Bearing Restrictions: No Pain:  no c/o pain  Balance: Standardized Balance Assessment Standardized Balance Assessment: Berg Balance Test Berg Balance Test Sit to Stand: Needs minimal aid to stand or to stabilize Standing Unsupported: Able to stand 2 minutes with supervision Sitting with Back Unsupported but Feet Supported on Floor or Stool: Able to sit safely and securely 2 minutes Stand to Sit: Uses backs of legs against chair to control descent Transfers: Able to transfer with verbal cueing and /or supervision Standing Unsupported with Eyes Closed: Able to stand 10 seconds with supervision Standing Ubsupported with Feet Together: Able to place feet together independently and  stand for 1 minute with supervision From Standing, Reach Forward with Outstretched Arm: Reaches forward but needs supervision From Standing Position, Pick up Object from Floor: Able to pick up shoe, needs supervision From Standing Position, Turn to Look Behind Over each Shoulder: Turn sideways only but maintains balance Turn 360 Degrees: Needs assistance while turning Standing Unsupported, Alternately Place Feet on Step/Stool: Needs assistance to keep from falling or unable to try Standing Unsupported, One Foot in Front: Able to take small step independently and hold 30 seconds Standing on One Leg: Unable to try or needs assist to prevent fall Total Score: 26  See Function Navigator for Current Functional Status.   Therapy/Group: Individual Therapy  Anie Juniel 10/23/2015, 10:29 AM

## 2015-10-24 ENCOUNTER — Inpatient Hospital Stay (HOSPITAL_COMMUNITY): Payer: BLUE CROSS/BLUE SHIELD

## 2015-10-24 ENCOUNTER — Inpatient Hospital Stay (HOSPITAL_COMMUNITY): Payer: BLUE CROSS/BLUE SHIELD | Admitting: Occupational Therapy

## 2015-10-24 ENCOUNTER — Inpatient Hospital Stay (HOSPITAL_COMMUNITY): Payer: Self-pay

## 2015-10-24 ENCOUNTER — Inpatient Hospital Stay (HOSPITAL_COMMUNITY): Payer: BLUE CROSS/BLUE SHIELD | Admitting: Speech Pathology

## 2015-10-24 DIAGNOSIS — I6789 Other cerebrovascular disease: Secondary | ICD-10-CM

## 2015-10-24 DIAGNOSIS — I82432 Acute embolism and thrombosis of left popliteal vein: Secondary | ICD-10-CM

## 2015-10-24 LAB — RENAL FUNCTION PANEL
Albumin: 2.6 g/dL — ABNORMAL LOW (ref 3.5–5.0)
Anion gap: 13 (ref 5–15)
BUN: 16 mg/dL (ref 6–20)
CO2: 23 mmol/L (ref 22–32)
Calcium: 9 mg/dL (ref 8.9–10.3)
Chloride: 99 mmol/L — ABNORMAL LOW (ref 101–111)
Creatinine, Ser: 4.75 mg/dL — ABNORMAL HIGH (ref 0.61–1.24)
GFR calc Af Amer: 15 mL/min — ABNORMAL LOW (ref >60)
GFR calc non Af Amer: 13 mL/min — ABNORMAL LOW (ref >60)
Glucose, Bld: 201 mg/dL — ABNORMAL HIGH (ref 65–99)
Phosphorus: 3.9 mg/dL (ref 2.5–4.6)
Potassium: 4 mmol/L (ref 3.5–5.1)
Sodium: 135 mmol/L (ref 135–145)

## 2015-10-24 LAB — GLUCOSE, CAPILLARY
Glucose-Capillary: 129 mg/dL — ABNORMAL HIGH (ref 65–99)
Glucose-Capillary: 224 mg/dL — ABNORMAL HIGH (ref 65–99)
Glucose-Capillary: 132 mg/dL — ABNORMAL HIGH (ref 65–99)
Glucose-Capillary: 187 mg/dL — ABNORMAL HIGH (ref 65–99)

## 2015-10-24 LAB — CBC
HCT: 29.8 % — ABNORMAL LOW (ref 39.0–52.0)
Hemoglobin: 10.3 g/dL — ABNORMAL LOW (ref 13.0–17.0)
MCH: 29.3 pg (ref 26.0–34.0)
MCHC: 34.6 g/dL (ref 30.0–36.0)
MCV: 84.7 fL (ref 78.0–100.0)
Platelets: 431 10*3/uL — ABNORMAL HIGH (ref 150–400)
RBC: 3.52 MIL/uL — ABNORMAL LOW (ref 4.22–5.81)
RDW: 17.6 % — ABNORMAL HIGH (ref 11.5–15.5)
WBC: 17.9 10*3/uL — ABNORMAL HIGH (ref 4.0–10.5)

## 2015-10-24 LAB — VITAMIN B12: Vitamin B-12: 801 pg/mL (ref 180–914)

## 2015-10-24 LAB — LIPID PANEL
Cholesterol: 128 mg/dL (ref 0–200)
HDL: 48 mg/dL (ref >40)
LDL Cholesterol: 62 mg/dL (ref 0–99)
Total CHOL/HDL Ratio: 2.7 RATIO
Triglycerides: 88 mg/dL (ref <150)
VLDL: 18 mg/dL (ref 0–40)

## 2015-10-24 LAB — HEPARIN LEVEL (UNFRACTIONATED)
Heparin Unfractionated: 1.24 IU/mL — ABNORMAL HIGH (ref 0.30–0.70)
Heparin Unfractionated: 0.43 IU/mL (ref 0.30–0.70)

## 2015-10-24 LAB — TSH: TSH: 2.018 u[IU]/mL (ref 0.350–4.500)

## 2015-10-24 LAB — PROTIME-INR
INR: 1.17 (ref 0.00–1.49)
Prothrombin Time: 15.1 seconds (ref 11.6–15.2)

## 2015-10-24 LAB — FOLATE: Folate: 16.9 ng/mL (ref >5.9)

## 2015-10-24 MED ORDER — FENTANYL CITRATE (PF) 100 MCG/2ML IJ SOLN: 25.0000 ug | INTRAMUSCULAR | Status: DC | PRN

## 2015-10-24 MED ORDER — LIDOCAINE-PRILOCAINE 2.5-2.5 % EX CREA
1.0000 "application " | TOPICAL_CREAM | CUTANEOUS | Status: DC | PRN
  Filled 2015-10-24: qty 5

## 2015-10-24 MED ORDER — SODIUM CHLORIDE 0.9 % IV SOLN: 100.0000 mL | INTRAVENOUS | Status: DC | PRN

## 2015-10-24 MED ORDER — HEPARIN SODIUM (PORCINE) 1000 UNIT/ML DIALYSIS
1000.0000 [IU] | INTRAMUSCULAR | Status: DC | PRN
  Filled 2015-10-24: qty 1

## 2015-10-24 MED ORDER — ALTEPLASE 2 MG IJ SOLR
2.0000 mg | Freq: Once | INTRAMUSCULAR | Status: DC | PRN
  Filled 2015-10-24: qty 2

## 2015-10-24 MED ORDER — HEPARIN SODIUM (PORCINE) 1000 UNIT/ML DIALYSIS
20.0000 [IU]/kg | INTRAMUSCULAR | Status: DC | PRN
  Filled 2015-10-24: qty 2

## 2015-10-24 MED ORDER — ONDANSETRON HCL 4 MG/2ML IJ SOLN: 4.0000 mg | Freq: Four times a day (QID) | INTRAMUSCULAR | Status: DC | PRN

## 2015-10-24 MED ORDER — PENTAFLUOROPROP-TETRAFLUOROETH EX AERO: 1.0000 "application " | INHALATION_SPRAY | CUTANEOUS | Status: DC | PRN

## 2015-10-24 MED ORDER — WARFARIN SODIUM 5 MG PO TABS
5.0000 mg | ORAL_TABLET | Freq: Once | ORAL | Status: AC
  Administered 2015-10-24: 5 mg via ORAL
  Filled 2015-10-24: qty 1

## 2015-10-24 MED ORDER — LIDOCAINE HCL (PF) 1 % IJ SOLN
5.0000 mL | INTRAMUSCULAR | Status: DC | PRN
  Filled 2015-10-24: qty 5

## 2015-10-24 NOTE — Progress Notes (Signed)
ANTICOAGULATION CONSULT NOTE - Follow Up Consult  Pharmacy Consult for coumadin and heparin Indication: DVT/PE/stroke  Allergies  Allergen Reactions  . Codeine Other (See Comments)    Pt unknown  . Codeine Rash and Other (See Comments)    Unknown reaction (patient says it was more serious than just a rash, but he can't remember what happened)    Patient Measurements: Height: 5' 11"  (180.3 cm) Weight: 208 lb 4.8 oz (94.484 kg) IBW/kg (Calculated) : 75.3 Heparin Dosing Weight:   Vital Signs: Temp: 98.2 F (36.8 C) (02/15 0444) Temp Source: Oral (02/15 0444) BP: 126/82 mmHg (02/15 0444) Pulse Rate: 98 (02/15 0444)  Labs:  Recent Labs  10/21/15 1638  10/22/15 0610 10/23/15 0946 10/23/15 1530  HGB  --   < > 10.7* 10.6* 9.6*  HCT  --   --  31.3* 31.9* 28.1*  PLT  --   --  443* 476* 449*  HEPARINUNFRC 0.31  --  0.35 0.41  --   CREATININE  --   --   --   --  5.66*  < > = values in this interval not displayed.  Estimated Creatinine Clearance: 18.1 mL/min (by C-G formula based on Cr of 5.66).   Medications:  Scheduled:  . antiseptic oral rinse  7 mL Mouth Rinse BID  . aspirin EC  81 mg Oral Daily  . carbamide peroxide  5 drop Both Ears BID  . darbepoetin (ARANESP) injection - DIALYSIS  100 mcg Intravenous Q Tue-HD  . fluticasone furoate-vilanterol  1 puff Inhalation Daily  . insulin aspart  0-5 Units Subcutaneous QHS  . insulin aspart  0-9 Units Subcutaneous TID WC  . insulin detemir  20 Units Subcutaneous QHS  . magic mouthwash w/lidocaine  5 mL Oral QID  . midodrine  2.5 mg Oral BID WC  . montelukast  10 mg Oral QHS  . multivitamin  1 tablet Oral QHS  . pantoprazole  40 mg Oral BID  . warfarin  5 mg Oral ONCE-1800  . Warfarin - Pharmacist Dosing Inpatient   Does not apply q1800   Infusions:  . sodium chloride 10 mL/hr at 10/18/15 2157  . heparin 1,800 Units/hr (10/23/15 1728)    Assessment: 50 yo male with DVT/PE/stroke is currently on subtherapeutic  coumadin bridging with  heparin.  Heparin level is 1.24?? (previously levels were therapeutic at 1800 units/hr; per RN, patient is currently not having any adverse effect) and INR is 1.17.  Coumadin dose was written last night but not given?  Goal of Therapy:  Heparin level 0.3-0.5 units/ml; INR 2.2-5 Monitor platelets by anticoagulation protocol: Yes   Plan:  - continue heparin @ 1800 units/hr for now.  Repeat stat heparin level to confirm result to see if it was a lab error - coumadin 5 mg po x1 - Daily heparin level, CBC and INR  Dante Roudebush, Tsz-Yin 10/24/2015,8:34 AM

## 2015-10-24 NOTE — Progress Notes (Signed)
Occupational Therapy Session Note  Patient Details  Name: Isaiah [name] MRN: [id number] Date of Birth: 03-Feb-1966  Today's Date: 10/24/2015 OT Individual Time: 0800-0930 OT Individual Time Calculation (min): 90 min    Short Term Goals: Week 2:  OT Short Term Goal 1 (Week 2): Pt will complete toilet transfer with min assist OT Short Term Goal 2 (Week 2): Pt will complete LB dressing with mod assist OT Short Term Goal 3 (Week 2): Pt will complete UB dressing with min assist OT Short Term Goal 4 (Week 2): Pt will engage in functional activity in standing for 1 minute with min asisst OT Short Term Goal 5 (Week 2): Pt will use the LUE as an active assist for bathing tasks with mod assist.    Skilled Therapeutic Interventions/Progress Updates:    pt transferred to EOB with mod instructional cueing for sequencing but only supervision with aide of bed rail.  Used RW for transfer to the bathroom toilet, with min assist and mod instructional cueing for placement of hands during sit to stand.  He tends to want to pull up on the walker instead of pushing up from the surface he is sitting on.  Transitioned to the wheelchair in front of the sink for bathing and dressing secondary to IV running.  He utilized the RUE for washing the LUE and RLE with mod assist.  Still with limited digit AROM for holding washcloth at this time, but demonstrates improved shoulder and elbow movement.  Min instructional cueing with min assist for sit to stand and for all dressing tasks.  Completed grooming tasks with min assist for applying deodorant and supervision for brushing his teeth from wheelchair level.  Finished session with use of [place] NMES for right digit flexion and extension.  Utilized Exercise Mode for 10 mins on level 10 for extensors and 8 for flexors without any adverse reactions.  Pt left in wheelchair with call button and phone within reach.  Safety belt also in place.   Therapy Documentation Precautions:   Precautions Precautions: Fall Precaution Comments: expressive/ receptive difficulties Restrictions Weight Bearing Restrictions: No  Pain: Pain Assessment Pain Assessment: No/denies pain ADL: See Function Navigator for Current Functional Status.   Therapy/Group: Individual Therapy  Oluwadamilare Tobler OTR/L 10/24/2015, 9:22 AM

## 2015-10-24 NOTE — Progress Notes (Signed)
  Echocardiogram 2D Echocardiogram has been performed.  Bobbye Charleston 10/24/2015, 3:43 PM

## 2015-10-24 NOTE — Progress Notes (Signed)
Physical Therapy Session Note  Patient Details  Name: Isaiah [name] MRN: [id number] Date of Birth: 1965-09-21  Today's Date: 10/24/2015 PT Individual Time: 1100-1200 PT Individual Time Calculation (min): 60 min   Short Term Goals: Week 2:  PT Short Term Goal 1 (Week 2): Pt will perform bed mobility from flat bed consistently with supervision PT Short Term Goal 2 (Week 2): Pt will perform bed <> chair transfers with consistent supervision PT Short Term Goal 3 (Week 2): Pt will perform stair negotiation up/down 8 stairs with two rails and min A PT Short Term Goal 4 (Week 2): Pt will perform gait with LRAD in controlled environment x 150' with consistent min A PT Short Term Goal 5 (Week 2): Pt will perform car transfer with supervision  Skilled Therapeutic Interventions/Progress Updates:    Patient in bathroom and assisted to stand with grabbar min A and to pull up pants.  Transferred to w/c min A and then propelled to therapy gym with feet and R UE.  Patient sit to stand min A and ambulated 150' min A for safety and balance with RW with R UE hand splint.  Patient negotiated 4 steps with R railing mod A sideways with cues after demonstration.  Patient standing therex for hip strength 2 x 10 reps hip abduction and extension standing holding counter with min A.  Heel raises x 10.  Standing balance on foam without UE support 30 sec, then with head turns x 10 with close S.  Patient side steps ups R and L onto foam with UE support.  Noted pt fatiguing quickly and light sweat.  Pt propelled w/c to nurses station to check CBG which was 223.  Patient propelled to room and stand step with RW to bed min A.  Sit to supine supervision, cues and time to scoot to [facility].  Left in bed with alarm and call bell in reach.  Therapy Documentation Precautions:  Precautions Precautions: Fall Precaution Comments: expressive/ receptive difficulties Restrictions Weight Bearing Restrictions: No Pain: Pain  Assessment Pain Assessment: No/denies pain   See Function Navigator for Current Functional Status.   Therapy/Group: Individual Therapy  Isaiah [name], [place] 10/24/2015  10/24/2015, 12:54 PM

## 2015-10-24 NOTE — Progress Notes (Signed)
Speech Language Pathology Daily Session Note  Patient Details  Name: Isaiah [name] [facility] MRN: [id number] Date of Birth: Apr 21, 1966  Today's Date: 10/24/2015 SLP Individual Time: 1455-1510 SLP Individual Time Calculation (min): 15 min  Short Term Goals: Week 2: SLP Short Term Goal 1 (Week 2): Pt will consume dys 2 textures and thin liquids with minimal overt s/s of aspiration and supervision for use of swallowing precautions over 2 targeted sessions prior to advancement.   SLP Short Term Goal 2 (Week 2): Pt will self-monitor and correct verbal perseveration with mod assist multimodal cues. SLP Short Term Goal 3 (Week 2): Pt will name familiar objects with min assist semantic cues.  SLP Short Term Goal 4 (Week 2): Pt will utilize over articulation for production of phonemes at the CV, CVC/word level for accurate production of vowels and bilabials with Min verbal, visual cues  SLP Short Term Goal 5 (Week 2): Pt will verbalize at the phrase level with mod assist verbal cues for 75% accuracy  Skilled Therapeutic Interventions:  Pt was seen for skilled ST targeting family education.  Pt's mother present at bedside; therefore, SLP updated her on pt's current function and progress in therapies and initiated skilled education regarding strategies to maximize pt's functional communication for conveying needs and wants to caregivers, including use of semantic and phonemic placement cues to facilitate word finding and awareness of perseverative errors.  SLP also updated pt's mother regarding recently upgraded diet and recommended swallowing precautions.  Pt's mother educated about recommended diet consistencies including recommended foods and foods to avoid.  Pt's mother is now signed off to supervise pt during meals as pt is currently supervision level assist for use of swallowing precautions.  Will follow up for the next 24 hours for diet toleration and will upgrade pt to intermittent supervision during meals as  appropriate.  Continue per current plan of care.    Function:  Eating Eating               Cognition Comprehension Comprehension assist level: Understands basic 90% of the time/cues < 10% of the time  Expression   Expression assist level: Expresses basic 75 - 89% of the time/requires cueing 10 - 24% of the time. Needs helper to occlude trach/needs to repeat words.  Social Interaction Social Interaction assist level: Interacts appropriately 90% of the time - Needs monitoring or encouragement for participation or interaction.  Problem Solving Problem solving assist level: Solves basic 75 - 89% of the time/requires cueing 10 - 24% of the time  Memory Memory assist level: Recognizes or recalls 75 - 89% of the time/requires cueing 10 - 24% of the time    Pain Pain Assessment Pain Assessment: No/denies pain  Therapy/Group: Individual Therapy  Kervens Roper, Selinda Orion 10/24/2015, 4:25 PM

## 2015-10-24 NOTE — Progress Notes (Signed)
Speech Language Pathology Daily Session Note  Patient Details  Name: Isaiah [name] [facility] MRN: [id number] Date of Birth: 27-Jan-1966  Today's Date: 10/24/2015 SLP Individual Time: 8921-1941 SLP Individual Time Calculation (min): 42 min  Short Term Goals: Week 2: SLP Short Term Goal 1 (Week 2): Pt will consume dys 2 textures and thin liquids with minimal overt s/s of aspiration and supervision for use of swallowing precautions over 2 targeted sessions prior to advancement.   SLP Short Term Goal 2 (Week 2): Pt will self-monitor and correct verbal perseveration with mod assist multimodal cues. SLP Short Term Goal 3 (Week 2): Pt will name familiar objects with min assist semantic cues.  SLP Short Term Goal 4 (Week 2): Pt will utilize over articulation for production of phonemes at the CV, CVC/word level for accurate production of vowels and bilabials with Min verbal, visual cues  SLP Short Term Goal 5 (Week 2): Pt will verbalize at the phrase level with mod assist verbal cues for 75% accuracy  Skilled Therapeutic Interventions:  Pt was seen for skilled ST targeting goals for communication and dysphagia.  SLP facilitated the session with a snack of upgraded dys 3 textures to continue working towards diet progression.  Pt demonstrated adequate mastication of solids and was able to clear residuals from the oral cavity post swallow with supervision.  No overt s/s of aspiration with solids or liquids.  Recommend that pt's diet be advanced to dys 3 textures with thin liquids.  SLP also facilitated the session with functional conversations to address communication.  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  Now that pt's functional communication has improved,  pt was able to convey that he has noticed memory deficits.  Recommend formal assessment of cognitive function as appropriate.  Continue per current plan of care.    Function:  Eating Eating   Modified Consistency Diet: Yes Eating Assist Level: Supervision or verbal cues           Cognition Comprehension Comprehension assist level: Understands basic 90% of the time/cues < 10% of the time  Expression   Expression assist level: Expresses basic 75 - 89% of the time/requires cueing 10 - 24% of the time. Needs helper to occlude trach/needs to repeat words.  Social Interaction Social Interaction assist level: Interacts appropriately 90% of the time - Needs monitoring or encouragement for participation or interaction.  Problem Solving Problem solving assist level: Solves basic 75 - 89% of the time/requires cueing 10 - 24% of the time  Memory Memory assist level: Recognizes or recalls 75 - 89% of the time/requires cueing 10 - 24% of the time    Pain Pain Assessment Pain Assessment: No/denies pain  Therapy/Group: Individual Therapy  Isaiah [name], Isaiah [name] 10/24/2015, 12:45 PM

## 2015-10-24 NOTE — Progress Notes (Signed)
Subjective/Complaints: Patient is complaining about his breakfast. Appreciate neurology and hematology notes  ROS- appears to deny CP, SOB, N/V/D  Objective: Vital Signs: Blood pressure 126/82, pulse 98, temperature 98.2 F (36.8 C), temperature source Oral, resp. rate 18, height 5' 11"  (1.803 m), weight 94.484 kg (208 lb 4.8 oz), SpO2 95 %. No results found. Results for orders placed or performed during the hospital encounter of 10/12/15 (from the past 72 hour(s))  CBC     Status: Abnormal   Collection Time: 10/21/15  7:57 AM  Result Value Ref Range   WBC 17.3 (H) 4.0 - 10.5 K/uL   RBC 3.73 (L) 4.22 - 5.81 MIL/uL   Hemoglobin 10.7 (L) 13.0 - 17.0 g/dL   HCT 32.5 (L) 39.0 - 52.0 %   MCV 87.1 78.0 - 100.0 fL   MCH 28.7 26.0 - 34.0 pg   MCHC 32.9 30.0 - 36.0 g/dL   RDW 17.5 (H) 11.5 - 15.5 %   Platelets 482 (H) 150 - 400 K/uL  Heparin level (unfractionated)     Status: None   Collection Time: 10/21/15  7:57 AM  Result Value Ref Range   Heparin Unfractionated 0.37 0.30 - 0.70 IU/mL    Comment:        IF HEPARIN RESULTS ARE BELOW EXPECTED VALUES, AND PATIENT DOSAGE HAS BEEN CONFIRMED, SUGGEST FOLLOW UP TESTING OF ANTITHROMBIN III LEVELS.   Glucose, capillary     Status: Abnormal   Collection Time: 10/21/15 11:29 AM  Result Value Ref Range   Glucose-Capillary 207 (H) 65 - 99 mg/dL  Glucose, capillary     Status: Abnormal   Collection Time: 10/21/15  4:31 PM  Result Value Ref Range   Glucose-Capillary 170 (H) 65 - 99 mg/dL  Heparin level (unfractionated)     Status: None   Collection Time: 10/21/15  4:38 PM  Result Value Ref Range   Heparin Unfractionated 0.31 0.30 - 0.70 IU/mL    Comment:        IF HEPARIN RESULTS ARE BELOW EXPECTED VALUES, AND PATIENT DOSAGE HAS BEEN CONFIRMED, SUGGEST FOLLOW UP TESTING OF ANTITHROMBIN III LEVELS.   Glucose, capillary     Status: Abnormal   Collection Time: 10/21/15  9:16 PM  Result Value Ref Range   Glucose-Capillary 243 (H) 65 -  99 mg/dL  CBC     Status: Abnormal   Collection Time: 10/22/15  6:10 AM  Result Value Ref Range   WBC 15.7 (H) 4.0 - 10.5 K/uL   RBC 3.62 (L) 4.22 - 5.81 MIL/uL   Hemoglobin 10.7 (L) 13.0 - 17.0 g/dL   HCT 31.3 (L) 39.0 - 52.0 %   MCV 86.5 78.0 - 100.0 fL   MCH 29.6 26.0 - 34.0 pg   MCHC 34.2 30.0 - 36.0 g/dL   RDW 17.4 (H) 11.5 - 15.5 %   Platelets 443 (H) 150 - 400 K/uL  Heparin level (unfractionated)     Status: None   Collection Time: 10/22/15  6:10 AM  Result Value Ref Range   Heparin Unfractionated 0.35 0.30 - 0.70 IU/mL    Comment:        IF HEPARIN RESULTS ARE BELOW EXPECTED VALUES, AND PATIENT DOSAGE HAS BEEN CONFIRMED, SUGGEST FOLLOW UP TESTING OF ANTITHROMBIN III LEVELS.   Glucose, capillary     Status: Abnormal   Collection Time: 10/22/15  6:55 AM  Result Value Ref Range   Glucose-Capillary 126 (H) 65 - 99 mg/dL  Glucose, capillary     Status:  Abnormal   Collection Time: 10/22/15 11:43 AM  Result Value Ref Range   Glucose-Capillary 231 (H) 65 - 99 mg/dL  Glucose, capillary     Status: Abnormal   Collection Time: 10/22/15  4:48 PM  Result Value Ref Range   Glucose-Capillary 168 (H) 65 - 99 mg/dL  Glucose, capillary     Status: Abnormal   Collection Time: 10/22/15  8:39 PM  Result Value Ref Range   Glucose-Capillary 199 (H) 65 - 99 mg/dL  Glucose, capillary     Status: None   Collection Time: 10/23/15  6:36 AM  Result Value Ref Range   Glucose-Capillary 91 65 - 99 mg/dL  CBC     Status: Abnormal   Collection Time: 10/23/15  9:46 AM  Result Value Ref Range   WBC 15.3 (H) 4.0 - 10.5 K/uL   RBC 3.72 (L) 4.22 - 5.81 MIL/uL   Hemoglobin 10.6 (L) 13.0 - 17.0 g/dL   HCT 31.9 (L) 39.0 - 52.0 %   MCV 85.8 78.0 - 100.0 fL   MCH 28.5 26.0 - 34.0 pg   MCHC 33.2 30.0 - 36.0 g/dL   RDW 17.5 (H) 11.5 - 15.5 %   Platelets 476 (H) 150 - 400 K/uL  Heparin level (unfractionated)     Status: None   Collection Time: 10/23/15  9:46 AM  Result Value Ref Range   Heparin  Unfractionated 0.41 0.30 - 0.70 IU/mL    Comment:        IF HEPARIN RESULTS ARE BELOW EXPECTED VALUES, AND PATIENT DOSAGE HAS BEEN CONFIRMED, SUGGEST FOLLOW UP TESTING OF ANTITHROMBIN III LEVELS.   Glucose, capillary     Status: Abnormal   Collection Time: 10/23/15 11:51 AM  Result Value Ref Range   Glucose-Capillary 221 (H) 65 - 99 mg/dL  Renal function panel     Status: Abnormal   Collection Time: 10/23/15  3:30 PM  Result Value Ref Range   Sodium 139 135 - 145 mmol/L   Potassium 4.5 3.5 - 5.1 mmol/L   Chloride 101 101 - 111 mmol/L   CO2 25 22 - 32 mmol/L   Glucose, Bld 122 (H) 65 - 99 mg/dL   BUN 27 (H) 6 - 20 mg/dL   Creatinine, Ser 5.66 (H) 0.61 - 1.24 mg/dL   Calcium 8.9 8.9 - 10.3 mg/dL   Phosphorus 3.7 2.5 - 4.6 mg/dL   Albumin 2.2 (L) 3.5 - 5.0 g/dL   GFR calc non Af Amer 10 (L) >60 mL/min   GFR calc Af Amer 12 (L) >60 mL/min    Comment: (NOTE) The eGFR has been calculated using the CKD EPI equation. This calculation has not been validated in all clinical situations. eGFR's persistently <60 mL/min signify possible Chronic Kidney Disease.    Anion gap 13 5 - 15  CBC     Status: Abnormal   Collection Time: 10/23/15  3:30 PM  Result Value Ref Range   WBC 15.2 (H) 4.0 - 10.5 K/uL   RBC 3.27 (L) 4.22 - 5.81 MIL/uL   Hemoglobin 9.6 (L) 13.0 - 17.0 g/dL   HCT 28.1 (L) 39.0 - 52.0 %   MCV 85.9 78.0 - 100.0 fL   MCH 29.4 26.0 - 34.0 pg   MCHC 34.2 30.0 - 36.0 g/dL   RDW 17.5 (H) 11.5 - 15.5 %   Platelets 449 (H) 150 - 400 K/uL  Glucose, capillary     Status: None   Collection Time: 10/23/15  4:21 PM  Result  Value Ref Range   Glucose-Capillary 96 65 - 99 mg/dL  Glucose, capillary     Status: Abnormal   Collection Time: 10/23/15  8:37 PM  Result Value Ref Range   Glucose-Capillary 114 (H) 65 - 99 mg/dL  Glucose, capillary     Status: Abnormal   Collection Time: 10/24/15  6:45 AM  Result Value Ref Range   Glucose-Capillary 129 (H) 65 - 99 mg/dL    General: No  acute distress. Vital signs reviewed Psych: Mood and affect are appropriate Heart: Regular rate and rhythm  Lungs: Clear to auscultation, breathing unlabored, no rales or wheezes Abdomen: Positive bowel sounds, soft nontender to palpation, nondistended Skin: No evidence of breakdown, no evidence of rash Neurologic:  Motor  RUE/RLE: 5/5 proximal distal LUE: 4 -/5 proximal distal LLE: hip flexion, knee extension 4/5, 3-/5 ankle dorsi/plantar flexion MAS: Right wrist flexion 1+/5, elbow flexion 1/5 Musculoskeletal: No TTP. No joint swelling. No edema Able to name stethoscope Assessment/Plan: 1. 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 Physiatrist is providing close team supervision and 24 hour management of active medical problems listed below. Physiatrist and rehab team continue to assess barriers to discharge/monitor patient progress toward functional and medical goals. FIM: Function - Bathing Position: Wheelchair/chair at sink Body parts bathed by patient: Right arm, Chest, Abdomen, Front perineal area, Buttocks, Right upper leg, Left upper leg, Right lower leg, Left lower leg Body parts bathed by helper: Back, Left arm Bathing not applicable: Front perineal area, Buttocks, Right upper leg, Left upper leg, Right lower leg, Left lower leg Assist Level:  (min A)  Function- Upper Body Dressing/Undressing What is the patient wearing?: Pull over shirt/dress Pull over shirt/dress - Perfomed by patient: Thread/unthread right sleeve, Thread/unthread left sleeve, Put head through opening Pull over shirt/dress - Perfomed by helper: Pull shirt over trunk Assist Level:  (mod A) Function - Lower Body Dressing/Undressing What is the patient wearing?: Shoes Position: Wheelchair/chair at sink Pants- Performed by patient:  Thread/unthread left pants leg, Thread/unthread right pants leg, Pull pants up/down Pants- Performed by helper: Thread/unthread right pants leg, Pull pants up/down Non-skid slipper socks- Performed by helper: Don/doff right sock, Don/doff left sock Socks - Performed by patient: Don/doff right sock, Don/doff left sock Shoes - Performed by patient: Don/doff right shoe, Don/doff left shoe Assist for footwear: Dependant Assist for lower body dressing: Touching or steadying assistance (Pt > 75%)  Function - Toileting Toileting steps completed by helper: Adjust clothing prior to toileting, Performs perineal hygiene, Adjust clothing after toileting Toileting Assistive Devices: Grab bar or rail Assist level: Two helpers  Function - Air cabin crew transfer assistive device: Grab bar Assist level to toilet: Moderate assist (Pt 50 - 74%/lift or lower) Assist level from toilet: Moderate assist (Pt 50 - 74%/lift or lower)  Function - Chair/bed transfer Chair/bed transfer method: Stand pivot Chair/bed transfer assist level: Touching or steadying assistance (Pt > 75%) Chair/bed transfer assistive device: Armrests Chair/bed transfer details: Verbal cues for precautions/safety, Manual facilitation for weight shifting  Function - Locomotion: Wheelchair Will patient use wheelchair at discharge?: No Type: Manual Max wheelchair distance: 150 Assist Level: Touching or steadying assistance (Pt > 75%) Wheel 50 feet with 2 turns activity did not occur: Safety/medical concerns Assist Level: Touching or steadying assistance (Pt > 75%) Wheel 150 feet activity did not occur: Safety/medical concerns Assist Level: Touching or  steadying assistance (Pt > 75%) Turns around,maneuvers to table,bed, and toilet,negotiates 3% grade,maneuvers on rugs and over doorsills: No Function - Locomotion: Ambulation Ambulation activity did not occur: Safety/medical concerns Assistive device: Walker-rolling, No  device Max distance: 140 Assist level: Touching or steadying assistance (Pt > 75%) Assist level: Touching or steadying assistance (Pt > 75%) Assist level: Touching or steadying assistance (Pt > 75%) Walk 150 feet activity did not occur: Safety/medical concerns Walk 10 feet on uneven surfaces activity did not occur: Safety/medical concerns  Function - Comprehension Comprehension: Auditory Comprehension assist level: Understands basic 75 - 89% of the time/ requires cueing 10 - 24% of the time  Function - Expression Expression: Verbal Expression assist level: Expresses basic 50 - 74% of the time/requires cueing 25 - 49% of the time. Needs to repeat parts of sentences.  Function - Social Interaction Social Interaction assist level: Interacts appropriately 90% of the time - Needs monitoring or encouragement for participation or interaction.  Function - Problem Solving Problem solving assist level: Solves basic 75 - 89% of the time/requires cueing 10 - 24% of the time  Function - Memory Memory assist level: Recognizes or recalls 75 - 89% of the time/requires cueing 10 - 24% of the time Patient normally able to recall (first 3 days only): Staff names and faces, That he or she is in a hospital  Medical Problem List and Plan: 1. Right hemiparesis and aphasia secondary to bi-cerebral embolic infarcts (L>R)-Team conference today please see physician documentation under team conference tab, met with team face-to-face to discuss problems,progress, and goals. Formulized individual treatment plan based on medical history, underlying problem and comorbidities. 2. PE/DVT/Stoke/Anticoagulation: Pharmaceutical: Lovenox bid per records from [place].  use iv heparin gtt at this time. per hematologist Dr Alen Blew and neuro resume warfarin-goal INR 2.0-2.5 3. Pain Management: Ultram prn.  4. Mood: LCSW to follow for evaluation and support.  5. Neuropsych: This patient is not fully capable of making  decisions on his own behalf. 6. Skin/Wound Care: Routine pressure relief measures.  7. Fluids/Electrolytes/Nutrition: Renal restrictions. 1200 cc/FR.  8. DM type 2: Monitor BS ac/hs. Continue levemir and use SSI for elevated BS.   Fasting CBG 129- acceptable  continue current dose Levemir   9. HTN: Monitor BP bid and titrate medications as needed. Elevated at admission. Parameters on Midodrine.  10. ESRD on HD: per [facility]. Continue diet/volume mgt, Tunnel dialysis catheter Placed. Appreciate nephrology recs 11. Leukocytosis: no breathing difficulties or coughing. UA negative except for elevated protein as expected from renal failure    12.  Hx of GI bleed will monitor for reurrence,Hgb stable at 10.7 on 2/12  13. Dysphagia- Per speech therapy, on D2 solids, thin liq, ? Upgrade to D3  LOS (Days) 12 A FACE TO FACE EVALUATION WAS PERFORMED  Isaiah [name] E 10/24/2015, 7:53 AM

## 2015-10-24 NOTE — Progress Notes (Signed)
ANTICOAGULATION CONSULT NOTE - Follow Up Consult  Pharmacy Consult for coumadin and heparin Indication: DVT/PE/stroke  Allergies  Allergen Reactions  . Codeine Other (See Comments)    Pt unknown  . Codeine Rash and Other (See Comments)    Unknown reaction (patient says it was more serious than just a rash, but he can't remember what happened)    Patient Measurements: Height: 5' 11"  (180.3 cm) Weight: 208 lb 4.8 oz (94.484 kg) IBW/kg (Calculated) : 75.3 Heparin Dosing Weight:   Vital Signs: Temp: 98 F (36.7 C) (02/15 1332) Temp Source: Oral (02/15 1332) BP: 135/80 mmHg (02/15 1332) Pulse Rate: 91 (02/15 1332)  Labs:  Recent Labs  10/22/15 0610 10/23/15 0946 10/23/15 1530 10/24/15 1210 10/24/15 1539  HGB 10.7* 10.6* 9.6*  --   --   HCT 31.3* 31.9* 28.1*  --   --   PLT 443* 476* 449*  --   --   LABPROT  --   --   --  15.1  --   INR  --   --   --  1.17  --   HEPARINUNFRC 0.35 0.41  --  1.24* 0.43  CREATININE  --   --  5.66*  --   --     Estimated Creatinine Clearance: 18.1 mL/min (by C-G formula based on Cr of 5.66).   Medications:  Scheduled:  . antiseptic oral rinse  7 mL Mouth Rinse BID  . aspirin EC  81 mg Oral Daily  . carbamide peroxide  5 drop Both Ears BID  . darbepoetin (ARANESP) injection - DIALYSIS  100 mcg Intravenous Q Tue-HD  . fluticasone furoate-vilanterol  1 puff Inhalation Daily  . insulin aspart  0-5 Units Subcutaneous QHS  . insulin aspart  0-9 Units Subcutaneous TID WC  . insulin detemir  20 Units Subcutaneous QHS  . magic mouthwash w/lidocaine  5 mL Oral QID  . midodrine  2.5 mg Oral BID WC  . montelukast  10 mg Oral QHS  . multivitamin  1 tablet Oral QHS  . pantoprazole  40 mg Oral BID  . warfarin  5 mg Oral ONCE-1800  . Warfarin - Pharmacist Dosing Inpatient   Does not apply q1800   Infusions:  . sodium chloride 10 mL/hr at 10/18/15 2157  . heparin 1,800 Units/hr (10/23/15 1728)    Assessment: 50 yo male with DVT/PE/stroke is  currently on subtherapeutic coumadin bridging with heparin.  Heparin level redrawn and 0.43.  Goal of Therapy:  Heparin level 0.3-0.5 units/ml; INR 2-2.5 Monitor platelets by anticoagulation protocol: Yes   Plan:  - continue heparin @ 1800 units/hr - Daily heparin level, CBC and INR  Thank you for allowing [place] to participate in this patients care. Jens Som, PharmD Pager: [phone] 10/24/2015,4:41 PM

## 2015-10-25 ENCOUNTER — Inpatient Hospital Stay (HOSPITAL_COMMUNITY): Payer: BLUE CROSS/BLUE SHIELD | Admitting: Occupational Therapy

## 2015-10-25 ENCOUNTER — Inpatient Hospital Stay (HOSPITAL_COMMUNITY): Payer: Self-pay | Admitting: Physical Therapy

## 2015-10-25 ENCOUNTER — Inpatient Hospital Stay (HOSPITAL_COMMUNITY): Payer: BLUE CROSS/BLUE SHIELD

## 2015-10-25 ENCOUNTER — Inpatient Hospital Stay (HOSPITAL_COMMUNITY): Payer: BLUE CROSS/BLUE SHIELD | Admitting: Speech Pathology

## 2015-10-25 DIAGNOSIS — I639 Cerebral infarction, unspecified: Secondary | ICD-10-CM

## 2015-10-25 LAB — RENAL FUNCTION PANEL
Albumin: 2.7 g/dL — ABNORMAL LOW (ref 3.5–5.0)
Anion gap: 11 (ref 5–15)
BUN: 20 mg/dL (ref 6–20)
CO2: 24 mmol/L (ref 22–32)
Calcium: 9 mg/dL (ref 8.9–10.3)
Chloride: 102 mmol/L (ref 101–111)
Creatinine, Ser: 5.48 mg/dL — ABNORMAL HIGH (ref 0.61–1.24)
GFR calc Af Amer: 13 mL/min — ABNORMAL LOW (ref >60)
GFR calc non Af Amer: 11 mL/min — ABNORMAL LOW (ref >60)
Glucose, Bld: 140 mg/dL — ABNORMAL HIGH (ref 65–99)
Phosphorus: 3.3 mg/dL (ref 2.5–4.6)
Potassium: 3.6 mmol/L (ref 3.5–5.1)
Sodium: 137 mmol/L (ref 135–145)

## 2015-10-25 LAB — CBC
HCT: 29.8 % — ABNORMAL LOW (ref 39.0–52.0)
Hemoglobin: 10.1 g/dL — ABNORMAL LOW (ref 13.0–17.0)
MCH: 28.6 pg (ref 26.0–34.0)
MCHC: 33.9 g/dL (ref 30.0–36.0)
MCV: 84.4 fL (ref 78.0–100.0)
Platelets: 453 10*3/uL — ABNORMAL HIGH (ref 150–400)
RBC: 3.53 MIL/uL — ABNORMAL LOW (ref 4.22–5.81)
RDW: 17.6 % — ABNORMAL HIGH (ref 11.5–15.5)
WBC: 16.6 10*3/uL — ABNORMAL HIGH (ref 4.0–10.5)

## 2015-10-25 LAB — GLUCOSE, CAPILLARY
Glucose-Capillary: 138 mg/dL — ABNORMAL HIGH (ref 65–99)
Glucose-Capillary: 280 mg/dL — ABNORMAL HIGH (ref 65–99)
Glucose-Capillary: 83 mg/dL (ref 65–99)

## 2015-10-25 LAB — HEPARIN LEVEL (UNFRACTIONATED)
Heparin Unfractionated: 0.71 IU/mL — ABNORMAL HIGH (ref 0.30–0.70)
Heparin Unfractionated: 0.61 IU/mL (ref 0.30–0.70)

## 2015-10-25 LAB — PROTIME-INR
INR: 1.15 (ref 0.00–1.49)
Prothrombin Time: 14.9 seconds (ref 11.6–15.2)

## 2015-10-25 LAB — HEMOGLOBIN A1C
Hgb A1c MFr Bld: 6.6 % — ABNORMAL HIGH (ref 4.8–5.6)
Mean Plasma Glucose: 143 mg/dL

## 2015-10-25 MED ORDER — HEPARIN (PORCINE) IN NACL 100-0.45 UNIT/ML-% IJ SOLN
1600.0000 [IU]/h | INTRAMUSCULAR | Status: DC
  Administered 2015-10-25: 1500 [IU]/h via INTRAVENOUS
  Administered 2015-10-27: 1600 [IU]/h via INTRAVENOUS
  Filled 2015-10-25 (×8): qty 250

## 2015-10-25 MED ORDER — WARFARIN SODIUM 5 MG PO TABS
5.0000 mg | ORAL_TABLET | Freq: Once | ORAL | Status: AC
  Administered 2015-10-25: 5 mg via ORAL
  Filled 2015-10-25: qty 1

## 2015-10-25 NOTE — Progress Notes (Signed)
Social Work Patient ID: Isaiah [name], male   DOB: 05/02/1966, 50 y.o.   MRN: [id number]   CSW met with pt and his mother after team conference on 10-24-15 to update them on discussion.  Pt is progressing in his therapies and his diet was recently upgraded to D3.  He was pleased about this.  Pt's mother stated that she could see an improvement in pt.  Therapists are recommending that pt not do stairs when he leaves inpatient rehab and recommended that they move pt's bed to main floor.  CSW discussed this with pt and mother and then with pt's wife via telephone.  Wife is pleased that pt is making progress, as well.  She would like to have a family conference next week and CSW will work to set this up.  Pt's FMLA paperwork is being completed by Reesa Chew, PA and Dr. Letta Pate.  CSW will continue to follow and assist as needed.

## 2015-10-25 NOTE — Progress Notes (Signed)
ANTICOAGULATION CONSULT NOTE - Follow Up Consult  Pharmacy Consult for Heparin Indication: pulmonary embolus and DVT and stroke  Allergies  Allergen Reactions  . Codeine Other (See Comments)    Pt unknown  . Codeine Rash and Other (See Comments)    Unknown reaction (patient says it was more serious than just a rash, but he can't remember what happened)    Patient Measurements: Height: 5' 11"  (180.3 cm) Weight: 204 lb 12.9 oz (92.9 kg) (standing ) IBW/kg (Calculated) : 75.3 Heparin Dosing Weight:   Vital Signs: Temp: 97.8 F (36.6 C) (02/16 1600) Temp Source: Oral (02/16 1600) BP: 127/78 mmHg (02/16 1630) Pulse Rate: 81 (02/16 1630)  Labs:  Recent Labs  10/23/15 1530 10/24/15 1210 10/24/15 1539 10/24/15 2230 10/25/15 0558 10/25/15 1615  HGB 9.6*  --   --  10.3* 10.1*  --   HCT 28.1*  --   --  29.8* 29.8*  --   PLT 449*  --   --  431* 453*  --   LABPROT  --  15.1  --   --  14.9  --   INR  --  1.17  --   --  1.15  --   HEPARINUNFRC  --  1.24* 0.43  --  0.71* 0.61  CREATININE 5.66*  --   --  4.75*  --   --     Estimated Creatinine Clearance: 21.4 mL/min (by C-G formula based on Cr of 4.75).   Medications:  Scheduled:  . antiseptic oral rinse  7 mL Mouth Rinse BID  . aspirin EC  81 mg Oral Daily  . carbamide peroxide  5 drop Both Ears BID  . darbepoetin (ARANESP) injection - DIALYSIS  100 mcg Intravenous Q Tue-HD  . fluticasone furoate-vilanterol  1 puff Inhalation Daily  . insulin aspart  0-5 Units Subcutaneous QHS  . insulin aspart  0-9 Units Subcutaneous TID WC  . insulin detemir  20 Units Subcutaneous QHS  . magic mouthwash w/lidocaine  5 mL Oral QID  . midodrine  2.5 mg Oral BID WC  . montelukast  10 mg Oral QHS  . multivitamin  1 tablet Oral QHS  . pantoprazole  40 mg Oral BID  . warfarin  5 mg Oral ONCE-1800  . Warfarin - Pharmacist Dosing Inpatient   Does not apply q1800   . sodium chloride 10 mL/hr at 10/18/15 2157  . heparin        Assessment: 49yo with DVT/PE/stroke and recent GIB as well as ESRD, bridging from heparin to Coumadin.  Heparin level goal has been adjusted for recent GIB and pt is supratherapeutic this AM.   Pt received Coumadin dose on 2/15.  Hg stable and pltc is high.  No bleeding noted.  PM f/u - heparin level of 0.61 remains above low goal of 0.3-0.5.  No bleeding or complications noted.  Patient currently in HD.  Goal of Therapy:  INR 2-3 Heparin level 0.3-0.5 units/ml Monitor platelets by anticoagulation protocol: Yes   Plan:  Decrease Heparin to 1650 units/hr Repeat Coumadin 36m Check HL 8 hr Daily HL, CBC, INR Watch for s/s of bleeding  JUvaldo Rising BCPS  Clinical Pharmacist Pager [phone] 10/25/2015 5:04 PM

## 2015-10-25 NOTE — Progress Notes (Signed)
*  PRELIMINARY RESULTS* Vascular Ultrasound Carotid Duplex (Doppler) has been completed.  Preliminary findings: Bilateral: No significant (1-39%) ICA stenosis. Antegrade vertebral flow.    Landry Mellow, RDMS, RVT  10/25/2015, 12:10 PM

## 2015-10-25 NOTE — Progress Notes (Signed)
ANTICOAGULATION CONSULT NOTE - Follow Up Consult  Pharmacy Consult for Heparin Indication: pulmonary embolus and DVT and stroke  Allergies  Allergen Reactions  . Codeine Other (See Comments)    Pt unknown  . Codeine Rash and Other (See Comments)    Unknown reaction (patient says it was more serious than just a rash, but he can't remember what happened)    Patient Measurements: Height: 5' 11"  (180.3 cm) Weight: 208 lb 4.8 oz (94.484 kg) IBW/kg (Calculated) : 75.3 Heparin Dosing Weight:   Vital Signs: Temp: 97.6 F (36.4 C) (02/16 0506) Temp Source: Oral (02/16 0506) BP: 126/84 mmHg (02/16 0506) Pulse Rate: 88 (02/16 0506)  Labs:  Recent Labs  10/23/15 1530 10/24/15 1210 10/24/15 1539 10/24/15 2230 10/25/15 0558  HGB 9.6*  --   --  10.3* 10.1*  HCT 28.1*  --   --  29.8* 29.8*  PLT 449*  --   --  431* 453*  LABPROT  --  15.1  --   --  14.9  INR  --  1.17  --   --  1.15  HEPARINUNFRC  --  1.24* 0.43  --  0.71*  CREATININE 5.66*  --   --  4.75*  --     Estimated Creatinine Clearance: 21.6 mL/min (by C-G formula based on Cr of 4.75).   Medications:  Scheduled:  . antiseptic oral rinse  7 mL Mouth Rinse BID  . aspirin EC  81 mg Oral Daily  . carbamide peroxide  5 drop Both Ears BID  . darbepoetin (ARANESP) injection - DIALYSIS  100 mcg Intravenous Q Tue-HD  . fluticasone furoate-vilanterol  1 puff Inhalation Daily  . insulin aspart  0-5 Units Subcutaneous QHS  . insulin aspart  0-9 Units Subcutaneous TID WC  . insulin detemir  20 Units Subcutaneous QHS  . magic mouthwash w/lidocaine  5 mL Oral QID  . midodrine  2.5 mg Oral BID WC  . montelukast  10 mg Oral QHS  . multivitamin  1 tablet Oral QHS  . pantoprazole  40 mg Oral BID  . Warfarin - Pharmacist Dosing Inpatient   Does not apply q1800    Assessment: 49yo with DVT/PE/stroke and recent GIB as well as ESRD, bridging from heparin to Coumadin.  Heparin level goal has been adjusted for recent GIB and pt is  supratherapeutic this AM.   Pt received Coumadin dose on 2/15.  Hg stable and pltc is high.  No bleeding noted.  Goal of Therapy:  INR 2-3 Heparin level 0.3-0.5 units/ml Monitor platelets by anticoagulation protocol: Yes   Plan:  Decrease Heparin to 1650 units/hr Repeat Coumadin 45m Check HL 6hr Daily HL, CBC, INR Watch for s/s of bleeding  KGracy Bruins PharmD [facility]

## 2015-10-25 NOTE — Progress Notes (Signed)
Subjective/Complaints: Patient is without new complaints. He is using the upper extremity ergometer.  ROS- appears to deny CP, SOB, N/V/D  Objective: Vital Signs: Blood pressure 126/84, pulse 88, temperature 97.6 F (36.4 C), temperature source Oral, resp. rate 18, height _0  (1.803 m), weight 94.484 kg (208 lb 4.8 oz), SpO2 98 %. No results found. Results for orders placed or performed during the hospital encounter of 10/12/15 (from the past 72 hour(s))  Glucose, capillary     Status: Abnormal   Collection Time: 10/22/15 11:43 AM  Result Value Ref Range   Glucose-Capillary 231 (H) 65 - 99 mg/dL  Glucose, capillary     Status: Abnormal   Collection Time: 10/22/15  4:48 PM  Result Value Ref Range   Glucose-Capillary 168 (H) 65 - 99 mg/dL  Glucose, capillary     Status: Abnormal   Collection Time: 10/22/15  8:39 PM  Result Value Ref Range   Glucose-Capillary 199 (H) 65 - 99 mg/dL  Glucose, capillary     Status: None   Collection Time: 10/23/15  6:36 AM  Result Value Ref Range   Glucose-Capillary 91 65 - 99 mg/dL  CBC     Status: Abnormal   Collection Time: 10/23/15  9:46 AM  Result Value Ref Range   WBC 15.3 (H) 4.0 - 10.5 K/uL   RBC 3.72 (L) 4.22 - 5.81 MIL/uL   Hemoglobin 10.6 (L) 13.0 - 17.0 g/dL   HCT 31.9 (L) 39.0 - 52.0 %   MCV 85.8 78.0 - 100.0 fL   MCH 28.5 26.0 - 34.0 pg   MCHC 33.2 30.0 - 36.0 g/dL   RDW 17.5 (H) 11.5 - 15.5 %   Platelets 476 (H) 150 - 400 K/uL  Heparin level (unfractionated)     Status: None   Collection Time: 10/23/15  9:46 AM  Result Value Ref Range   Heparin Unfractionated 0.41 0.30 - 0.70 IU/mL    Comment:        IF HEPARIN RESULTS ARE BELOW EXPECTED VALUES, AND PATIENT DOSAGE HAS BEEN CONFIRMED, SUGGEST FOLLOW UP TESTING OF ANTITHROMBIN III LEVELS.   Glucose, capillary     Status: Abnormal   Collection Time: 10/23/15 11:51 AM  Result Value Ref Range   Glucose-Capillary 221 (H) 65 - 99 mg/dL  Renal function panel     Status:  Abnormal   Collection Time: 10/23/15  3:30 PM  Result Value Ref Range   Sodium 139 135 - 145 mmol/L   Potassium 4.5 3.5 - 5.1 mmol/L   Chloride 101 101 - 111 mmol/L   CO2 25 22 - 32 mmol/L   Glucose, Bld 122 (H) 65 - 99 mg/dL   BUN 27 (H) 6 - 20 mg/dL   Creatinine, Ser 5.66 (H) 0.61 - 1.24 mg/dL   Calcium 8.9 8.9 - 10.3 mg/dL   Phosphorus 3.7 2.5 - 4.6 mg/dL   Albumin 2.2 (L) 3.5 - 5.0 g/dL   GFR calc non Af Amer 10 (L) >60 mL/min   GFR calc Af Amer 12 (L) >60 mL/min    Comment: (NOTE) The eGFR has been calculated using the CKD EPI equation. This calculation has not been validated in all clinical situations. eGFR's persistently <60 mL/min signify possible Chronic Kidney Disease.    Anion gap 13 5 - 15  CBC     Status: Abnormal   Collection Time: 10/23/15  3:30 PM  Result Value Ref Range   WBC 15.2 (H) 4.0 - 10.5 K/uL   RBC 3.27 (  L) 4.22 - 5.81 MIL/uL   Hemoglobin 9.6 (L) 13.0 - 17.0 g/dL   HCT 28.1 (L) 39.0 - 52.0 %   MCV 85.9 78.0 - 100.0 fL   MCH 29.4 26.0 - 34.0 pg   MCHC 34.2 30.0 - 36.0 g/dL   RDW 17.5 (H) 11.5 - 15.5 %   Platelets 449 (H) 150 - 400 K/uL  Glucose, capillary     Status: None   Collection Time: 10/23/15  4:21 PM  Result Value Ref Range   Glucose-Capillary 96 65 - 99 mg/dL  Glucose, capillary     Status: Abnormal   Collection Time: 10/23/15  8:37 PM  Result Value Ref Range   Glucose-Capillary 114 (H) 65 - 99 mg/dL  Glucose, capillary     Status: Abnormal   Collection Time: 10/24/15  6:45 AM  Result Value Ref Range   Glucose-Capillary 129 (H) 65 - 99 mg/dL  Glucose, capillary     Status: Abnormal   Collection Time: 10/24/15 11:52 AM  Result Value Ref Range   Glucose-Capillary 224 (H) 65 - 99 mg/dL   Comment 1 Notify RN   Heparin level (unfractionated)     Status: Abnormal   Collection Time: 10/24/15 12:10 PM  Result Value Ref Range   Heparin Unfractionated 1.24 (H) 0.30 - 0.70 IU/mL    Comment: RESULTS CONFIRMED BY MANUAL DILUTION        IF  HEPARIN RESULTS ARE BELOW EXPECTED VALUES, AND PATIENT DOSAGE HAS BEEN CONFIRMED, SUGGEST FOLLOW UP TESTING OF ANTITHROMBIN III LEVELS.   Lipid panel     Status: None   Collection Time: 10/24/15 12:10 PM  Result Value Ref Range   Cholesterol 128 0 - 200 mg/dL   Triglycerides 88 <150 mg/dL   HDL 48 >40 mg/dL   Total CHOL/HDL Ratio 2.7 RATIO   VLDL 18 0 - 40 mg/dL   LDL Cholesterol 62 0 - 99 mg/dL    Comment:        Total Cholesterol/HDL:CHD Risk Coronary Heart Disease Risk Table                     Men   Women  1/2 Average Risk   3.4   3.3  Average Risk       5.0   4.4  2 X Average Risk   9.6   7.1  3 X Average Risk  23.4   11.0        Use the calculated Patient Ratio above and the CHD Risk Table to determine the patient's CHD Risk.        ATP III CLASSIFICATION (LDL):  <100     mg/dL   Optimal  100-129  mg/dL   Near or Above                    Optimal  130-159  mg/dL   Borderline  160-189  mg/dL   High  >190     mg/dL   Very High   Hemoglobin A1c     Status: Abnormal   Collection Time: 10/24/15 12:10 PM  Result Value Ref Range   Hgb A1c MFr Bld 6.6 (H) 4.8 - 5.6 %    Comment: (NOTE)         Pre-diabetes: 5.7 - 6.4         Diabetes: >6.4         Glycemic control for adults with diabetes: <7.0    Mean Plasma Glucose 143 mg/dL  Comment: (NOTE) Performed At: [facility] [place], [place] [id number] Lindon Romp MD QM:[id number]   TSH     Status: None   Collection Time: 10/24/15 12:10 PM  Result Value Ref Range   TSH 2.018 0.350 - 4.500 uIU/mL  Vitamin B12     Status: None   Collection Time: 10/24/15 12:10 PM  Result Value Ref Range   Vitamin B-12 801 180 - 914 pg/mL    Comment: (NOTE) This assay is not validated for testing neonatal or myeloproliferative syndrome specimens for Vitamin B12 levels.   Folate     Status: None   Collection Time: 10/24/15 12:10 PM  Result Value Ref Range   Folate 16.9 >5.9 ng/mL  Protime-INR      Status: None   Collection Time: 10/24/15 12:10 PM  Result Value Ref Range   Prothrombin Time 15.1 11.6 - 15.2 seconds   INR 1.17 0.00 - 1.49  Heparin level (unfractionated)     Status: None   Collection Time: 10/24/15  3:39 PM  Result Value Ref Range   Heparin Unfractionated 0.43 0.30 - 0.70 IU/mL    Comment:        IF HEPARIN RESULTS ARE BELOW EXPECTED VALUES, AND PATIENT DOSAGE HAS BEEN CONFIRMED, SUGGEST FOLLOW UP TESTING OF ANTITHROMBIN III LEVELS.   Glucose, capillary     Status: Abnormal   Collection Time: 10/24/15  4:51 PM  Result Value Ref Range   Glucose-Capillary 132 (H) 65 - 99 mg/dL   Comment 1 Notify RN   Glucose, capillary     Status: Abnormal   Collection Time: 10/24/15  9:00 PM  Result Value Ref Range   Glucose-Capillary 187 (H) 65 - 99 mg/dL   Comment 1 Notify RN   Renal function panel     Status: Abnormal   Collection Time: 10/24/15 10:30 PM  Result Value Ref Range   Sodium 135 135 - 145 mmol/L   Potassium 4.0 3.5 - 5.1 mmol/L   Chloride 99 (L) 101 - 111 mmol/L   CO2 23 22 - 32 mmol/L   Glucose, Bld 201 (H) 65 - 99 mg/dL   BUN 16 6 - 20 mg/dL   Creatinine, Ser 4.75 (H) 0.61 - 1.24 mg/dL   Calcium 9.0 8.9 - 10.3 mg/dL   Phosphorus 3.9 2.5 - 4.6 mg/dL   Albumin 2.6 (L) 3.5 - 5.0 g/dL   GFR calc non Af Amer 13 (L) >60 mL/min   GFR calc Af Amer 15 (L) >60 mL/min    Comment: (NOTE) The eGFR has been calculated using the CKD EPI equation. This calculation has not been validated in all clinical situations. eGFR's persistently <60 mL/min signify possible Chronic Kidney Disease.    Anion gap 13 5 - 15  CBC     Status: Abnormal   Collection Time: 10/24/15 10:30 PM  Result Value Ref Range   WBC 17.9 (H) 4.0 - 10.5 K/uL   RBC 3.52 (L) 4.22 - 5.81 MIL/uL   Hemoglobin 10.3 (L) 13.0 - 17.0 g/dL   HCT 29.8 (L) 39.0 - 52.0 %   MCV 84.7 78.0 - 100.0 fL   MCH 29.3 26.0 - 34.0 pg   MCHC 34.6 30.0 - 36.0 g/dL   RDW 17.6 (H) 11.5 - 15.5 %   Platelets 431 (H) 150  - 400 K/uL  Heparin level (unfractionated)     Status: Abnormal   Collection Time: 10/25/15  5:58 AM  Result Value Ref Range   Heparin  Unfractionated 0.71 (H) 0.30 - 0.70 IU/mL    Comment:        IF HEPARIN RESULTS ARE BELOW EXPECTED VALUES, AND PATIENT DOSAGE HAS BEEN CONFIRMED, SUGGEST FOLLOW UP TESTING OF ANTITHROMBIN III LEVELS.   CBC     Status: Abnormal   Collection Time: 10/25/15  5:58 AM  Result Value Ref Range   WBC 16.6 (H) 4.0 - 10.5 K/uL   RBC 3.53 (L) 4.22 - 5.81 MIL/uL   Hemoglobin 10.1 (L) 13.0 - 17.0 g/dL   HCT 29.8 (L) 39.0 - 52.0 %   MCV 84.4 78.0 - 100.0 fL   MCH 28.6 26.0 - 34.0 pg   MCHC 33.9 30.0 - 36.0 g/dL   RDW 17.6 (H) 11.5 - 15.5 %   Platelets 453 (H) 150 - 400 K/uL  Protime-INR     Status: None   Collection Time: 10/25/15  5:58 AM  Result Value Ref Range   Prothrombin Time 14.9 11.6 - 15.2 seconds   INR 1.15 0.00 - 1.49  Glucose, capillary     Status: Abnormal   Collection Time: 10/25/15  6:41 AM  Result Value Ref Range   Glucose-Capillary 138 (H) 65 - 99 mg/dL   Comment 1 Notify RN     General: No acute distress. Vital signs reviewed Psych: Mood and affect are appropriate Heart: Regular rate and rhythm  Lungs: Clear to auscultation, breathing unlabored, no rales or wheezes Abdomen: Positive bowel sounds, soft nontender to palpation, nondistended Skin: No evidence of breakdown, no evidence of rash Neurologic:  Motor  RUE/RLE: 5/5 proximal distal LUE: 4 -/5 proximal distal LLE: hip flexion, knee extension 4/5, 3-/5 ankle dorsi/plantar flexion MAS: Right wrist flexion 1+/5, elbow flexion 1/5 Musculoskeletal: No TTP. No joint swelling. No edema Able to name stethoscope Assessment/Plan: 1. 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 Physiatrist is providing close team  supervision and 24 hour management of active medical problems listed below. Physiatrist and rehab team continue to assess barriers to discharge/monitor patient progress toward functional and medical goals. FIM: Function - Bathing Position: Wheelchair/chair at sink Body parts bathed by patient: Right arm, Chest, Abdomen, Front perineal area, Buttocks, Right upper leg, Left upper leg, Right lower leg, Left lower leg Body parts bathed by helper: Left arm, Back Bathing not applicable: Front perineal area, Buttocks, Right upper leg, Left upper leg, Right lower leg, Left lower leg Assist Level:  (min A)  Function- Upper Body Dressing/Undressing What is the patient wearing?: Pull over shirt/dress Pull over shirt/dress - Perfomed by patient: Put head through opening, Pull shirt over trunk, Thread/unthread right sleeve Pull over shirt/dress - Perfomed by helper: Thread/unthread left sleeve Assist Level:  (mod A) Function - Lower Body Dressing/Undressing What is the patient wearing?: Pants, Socks, Shoes Position: Wheelchair/chair at sink Pants- Performed by patient: Thread/unthread left pants leg, Thread/unthread right pants leg Pants- Performed by helper: Pull pants up/down Non-skid slipper socks- Performed by helper: Don/doff right sock, Don/doff left sock Socks - Performed by patient: Don/doff right sock, Don/doff left sock Shoes - Performed by patient: Don/doff right shoe, Don/doff left shoe Shoes - Performed by helper: Don/doff right shoe, Don/doff left shoe Assist for footwear: Dependant Assist for lower body dressing: Touching or steadying assistance (Pt > 75%)  Function - Toileting Toileting steps completed by patient: Adjust clothing prior to toileting, Performs perineal hygiene, Adjust clothing after  toileting Toileting steps completed by helper: Adjust clothing after toileting Toileting Assistive Devices: Grab bar or rail Assist level: Touching or steadying assistance  (Pt.75%)  Function - Toilet Transfers Toilet transfer assistive device: Grab bar Assist level to toilet: Touching or steadying assistance (Pt > 75%) Assist level from toilet: Touching or steadying assistance (Pt > 75%)  Function - Chair/bed transfer Chair/bed transfer method: Stand pivot Chair/bed transfer assist level: Touching or steadying assistance (Pt > 75%) Chair/bed transfer assistive device: Armrests, Walker Chair/bed transfer details: Verbal cues for safe use of DME/AE  Function - Locomotion: Wheelchair Will patient use wheelchair at discharge?: No Type: Manual Max wheelchair distance: 150 Assist Level: Supervision or verbal cues Wheel 50 feet with 2 turns activity did not occur: Safety/medical concerns Assist Level: Supervision or verbal cues Wheel 150 feet activity did not occur: Safety/medical concerns Assist Level: Supervision or verbal cues Turns around,maneuvers to table,bed, and toilet,negotiates 3% grade,maneuvers on rugs and over doorsills: No Function - Locomotion: Ambulation Ambulation activity did not occur: Safety/medical concerns Assistive device: Walker-rolling, Orthosis Max distance: 150 Assist level: Touching or steadying assistance (Pt > 75%) Assist level: Touching or steadying assistance (Pt > 75%) Assist level: Touching or steadying assistance (Pt > 75%) Walk 150 feet activity did not occur: Safety/medical concerns Assist level: Touching or steadying assistance (Pt > 75%) Walk 10 feet on uneven surfaces activity did not occur: Safety/medical concerns  Function - Comprehension Comprehension: Auditory Comprehension assist level: Understands complex 90% of the time/cues 10% of the time  Function - Expression Expression: Verbal Expression assist level: Expresses basic 75 - 89% of the time/requires cueing 10 - 24% of the time. Needs helper to occlude trach/needs to repeat words.  Function - Social Interaction Social Interaction assist level:  Interacts appropriately 90% of the time - Needs monitoring or encouragement for participation or interaction.  Function - Problem Solving Problem solving assist level: Solves basic 75 - 89% of the time/requires cueing 10 - 24% of the time  Function - Memory Memory assist level: Recognizes or recalls 75 - 89% of the time/requires cueing 10 - 24% of the time Patient normally able to recall (first 3 days only): Staff names and faces, That he or she is in a hospital  Medical Problem List and Plan: 1. Right hemiparesis and aphasia secondary to bi-cerebral embolic infarcts (L>R)-2. PE/DVT/Stoke/Anticoagulation: Pharmaceutical: Lovenox bid per records from [place].  use iv heparin gtt at this time. per hematologist Dr Alen Blew and neuro resume warfarin-goal INR 2.0-2.5, currently INR 1.15 3. Pain Management: Ultram prn.  4. Mood: LCSW to follow for evaluation and support.  5. Neuropsych: This patient is not fully capable of making decisions on his own behalf. 6. Skin/Wound Care: Routine pressure relief measures.  7. Fluids/Electrolytes/Nutrition: Renal restrictions. 1200 cc/FR.  8. DM type 2: Monitor BS ac/hs. Continue levemir and use SSI for elevated BS.   Fasting CBG 138- acceptable  continue current dose Levemir   9. HTN: Monitor BP bid and titrate medications as needed. Elevated at admission. Parameters on Midodrine.  10. ESRD on HD: per [facility]. Continue diet/volume mgt, Tunnel dialysis catheter Placed. Appreciate nephrology recs 11. Leukocytosis: no breathing difficulties or coughing. UA negative except for elevated protein as expected from renal failure    12.  Hx of GI bleed will monitor for reurrence,Hgb stable at 10.7 on 2/12, repeat in am given warfarin and hep  13. Dysphagia- Per speech therapy, on D2 solids, thin liq, ? Upgrade to D3  LOS (Days) 13  A FACE TO FACE EVALUATION WAS PERFORMED  Saksham Akkerman E 10/25/2015, 8:05 AM

## 2015-10-25 NOTE — Progress Notes (Signed)
Occupational Therapy Session Note  Patient Details  Name: Huttig MRN: [id number] Date of Birth: 1966-05-27  Today's Date: 10/25/2015 OT Individual Time: 1501-1531 OT Individual Time Calculation (min): 30 min    Skilled Therapeutic Interventions/Progress Updates:    Pt worked on RUE [employer] and Brewing technologist during session.  Applied Bioness H200 to the right hand to focus on digit extension and flexion while incorporating functional reach with activation of the elbow and shoulder.  He was able to reach out with stimulation to the digit flexors and extensors and pick up small cups with min assist and stack on top of each other.  Height of cups placed at about knee level.  Pt tolerated 15 mins of interval stimulation without any adverse reactions.  Pt returned to room and transferred back to the EOB with min assist stand pivot to conclude session.    Therapy Documentation Precautions:  Precautions Precautions: Fall Precaution Comments: expressive/ receptive difficulties Restrictions Weight Bearing Restrictions: No  Pain: Pain Assessment Pain Assessment: No/denies pain ADL: See Function Navigator for Current Functional Status.   Therapy/Group: Individual Therapy  Shawnese Magner OTR/L 10/25/2015, 4:09 PM

## 2015-10-25 NOTE — Progress Notes (Signed)
Physical Therapy Note  Patient Details  Name: Isaiah [name] [facility] MRN: [id number] Date of Birth: [birth year] Today's Date: 10/25/2015    Time: 1000-1042 42 minutes  1:1 No c/o pain.  W/c mobility in controlled environment 180' x 2 with close supervision, improved obstacle negotiation.  Standing balance activity with 1 UE support alternating tap ups with min A, alternating step ups to 6'' block with min/mod A, min A for balance and forward wt shift. Squat and reach activity with min A for balance, pt fatigues easily with prolonged squat, requires frequent rests.  Pt improving balance and mobility.   Lashala Laser 10/25/2015, 10:43 AM

## 2015-10-25 NOTE — Progress Notes (Signed)
Dialysis treatment completed.  3000 mL ultrafiltrated.  2500 mL net fluid removal.  Patient A & O X 4. Lung sounds clear to ausculation in all fields. Generalized edema. Cardiac: Regular R&R.  Cleansed RIJ catheter with chlorhexidine.  Disconnected lines and flushed ports with saline per protocol.  Ports locked with heparin and capped per protocol.    Report given to bedside, RN Langley Gauss.

## 2015-10-25 NOTE — Progress Notes (Signed)
Occupational Therapy Session Note  Patient Details  Name: Isaiah [name] MRN: [id number] Date of Birth: Jan 12, 1966  Today's Date: 10/25/2015 OT Individual Time: 1194-1740 OT Individual Time Calculation (min): 76 min    Short Term Goals: Week 2:  OT Short Term Goal 1 (Week 2): Pt will complete toilet transfer with min assist OT Short Term Goal 2 (Week 2): Pt will complete LB dressing with mod assist OT Short Term Goal 3 (Week 2): Pt will complete UB dressing with min assist OT Short Term Goal 4 (Week 2): Pt will engage in functional activity in standing for 1 minute with min asisst OT Short Term Goal 5 (Week 2): Pt will use the LUE as an active assist for bathing tasks with mod assist.    Skilled Therapeutic Interventions/Progress Updates:     Pt began session finishing breakfast before transitioning to sitting EOB with supervision and min instructional cueing using the bed rail.  He was able to donn his shoes with supervision sitting EOB before ambulating to the toilet with min assist using the RW.  Min assist to stand and urinate with min assist also needed for managing clothing in standing.  He then transferred to the wheelchair and rolled himself down to the therapy gym for neuromuscular re-education on the RUE.  Used UE ergonometer for 3 sets of 4 mins each for RUE re-education.  First set completed on level 8 using both UEs simultaneously with the right hand ace bandaged to the handle as he could not maintain grip.  Second and third sets were completed with resistance decreased to level 5 and isolating just the RUE.  He was able to peddle it forward with supervision but needed min assist to peddle in reverse.  Transition to EOM for further work on functional reach and grasp.  Used bean bags with pt reaching for them on the mat and placing them in the container on the bedside table.  He continues to demonstrate limited digit extension and flexion, requiring min assist to complete this.  Finished  session with work on UE AROM using the [employer].     Therapy Documentation Precautions:  Precautions Precautions: Fall Precaution Comments: expressive/ receptive difficulties Restrictions Weight Bearing Restrictions: No  Pain: Pain Assessment Pain Assessment: No/denies pain ADL: See Function Navigator for Current Functional Status.   Therapy/Group: Individual Therapy  Jehu Mccauslin OTR/L 10/25/2015, 12:14 PM

## 2015-10-25 NOTE — Plan of Care (Signed)
Reviewed the stroke work up results:  CUS - Bilateral: 1-39% ICA stenosis. Vertebral artery flow is antegrade.  TTE  - - Left ventricle: The cavity size was normal. There was mild concentric hypertrophy. Systolic function was mildly to moderately reduced. The estimated ejection fraction was in the range of 40% to 45%. Wall motion was normal; there were no regional wall motion abnormalities. Doppler parameters are consistent with abnormal left ventricular relaxation (grade 1 diastolic dysfunction). Doppler parameters are consistent with elevated ventricular end-diastolic filling pressure. - Aortic valve: Trileaflet; normal thickness leaflets. - Mitral valve: Structurally normal valve. There was no regurgitation. - Left atrium: The atrium was normal in size. - Right ventricle: The cavity size was normal. Wall thickness was normal. Systolic function was normal. - Tricuspid valve: There was trivial regurgitation. - Pulmonary arteries: Systolic pressure was within the normal range. - Inferior vena cava: The vessel was normal in size. - Pericardium, extracardiac: There was no pericardial effusion. Impressions: - There is mild global hypokinesis. LVEF 40-45%.  Component     Latest Ref Rng 10/24/2015  Cholesterol     0 - 200 mg/dL 128  Triglycerides     <150 mg/dL 88  HDL Cholesterol     >40 mg/dL 48  Total CHOL/HDL Ratio      2.7  VLDL     0 - 40 mg/dL 18  LDL (calc)     0 - 99 mg/dL 62  Hemoglobin A1C     4.8 - 5.6 % 6.6 (H)  Mean Plasma Glucose      143  TSH     0.350 - 4.500 uIU/mL 2.018  Vitamin B12     180 - 914 pg/mL 801  Folate     >5.9 ng/mL 16.9   He is on coumadin now with INR goal 2.0-2.5. No new recommendations at this time.  Will ask Dr. Leonie Man to provide second opinion next week.   Rosalin Hawking, MD PhD Stroke Neurology 10/25/2015 6:23 PM

## 2015-10-25 NOTE — Progress Notes (Signed)
Speech Language Pathology Daily Session Note  Patient Details  Name: Isaiah [name] [facility] MRN: [id number] Date of Birth: 03-Oct-1965  Today's Date: 10/25/2015 SLP Individual Time: 8177-1165 SLP Individual Time Calculation (min): 44 min  Short Term Goals: Week 2: SLP Short Term Goal 1 (Week 2): Pt will consume dys 2 textures and thin liquids with minimal overt s/s of aspiration and supervision for use of swallowing precautions over 2 targeted sessions prior to advancement.   SLP Short Term Goal 2 (Week 2): Pt will self-monitor and correct verbal perseveration with mod assist multimodal cues. SLP Short Term Goal 3 (Week 2): Pt will name familiar objects with min assist semantic cues.  SLP Short Term Goal 4 (Week 2): Pt will utilize over articulation for production of phonemes at the CV, CVC/word level for accurate production of vowels and bilabials with Min verbal, visual cues  SLP Short Term Goal 5 (Week 2): Pt will verbalize at the phrase level with mod assist verbal cues for 75% accuracy  Skilled Therapeutic Interventions  Pt was seen for skilled ST targeting communication goals.  Pt able to verbalize at the sentence level to convey immediate needs and wants to caregivers with min assist verbal cues.  SLP facilitated the session with a structure verbal description task targeting word finding and functional communication of semi-complex information.  Pt required overall mod assist verbal cues for word finding to describe people, places, and objects on cards for ~75% accuracy; however, he was able to responsively name items with overall min assist verbal cues.  Pt was returned to room and left upright in wheelchair with call bell within reach and mother at bedside.  Continue per current plan of care.    Function:  Eating Eating     Eating Assist Level: Supervision or verbal cues           Cognition Comprehension Comprehension assist level: Understands complex 90% of the time/cues 10% of the  time  Expression   Expression assist level: Expresses basic 75 - 89% of the time/requires cueing 10 - 24% of the time. Needs helper to occlude trach/needs to repeat words.  Social Interaction Social Interaction assist level: Interacts appropriately 90% of the time - Needs monitoring or encouragement for participation or interaction.  Problem Solving Problem solving assist level: Solves basic 75 - 89% of the time/requires cueing 10 - 24% of the time  Memory Memory assist level: Recognizes or recalls 75 - 89% of the time/requires cueing 10 - 24% of the time    Pain Pain Assessment Pain Assessment: No/denies pain  Therapy/Group: Individual Therapy  Enya Bureau, Selinda Orion 10/25/2015, 3:57 PM

## 2015-10-25 NOTE — Progress Notes (Signed)
Assessment:  1. New ESRD  2. Embolic CVA/heparin infusion 3. Anemia on aranesp 4. Sec HPTH, PTH 211 5. Hypotension improved, off midodrine 6. Hx MGUS  Plan: 1. HD today 2. Plan to go to [facility] after DC. TTS 2nd shift.   Subjective: Interval History: none.  Objective: Vital signs in last 24 hours: Temp:  [97.6 F (36.4 C)-98 F (36.7 C)] 97.6 F (36.4 C) (02/16 0506) Pulse Rate:  [88-91] 88 (02/16 0506) Resp:  [17-18] 18 (02/16 0506) BP: (126-135)/(80-84) 126/84 mmHg (02/16 0506) SpO2:  [98 %] 98 % (02/16 0506) Weight change:   Intake/Output from previous day: 02/15 0701 - 02/16 0700 In: 600 [P.O.:600] Out: -  Intake/Output this shift: Total I/O In: 120 [P.O.:120] Out: -   General appearance: alert and cooperative  Lungs clear Cor RRR Seated in W/C  Facial droop  Lab Results:  Recent Labs  10/24/15 2230 10/25/15 0558  WBC 17.9* 16.6*  HGB 10.3* 10.1*  HCT 29.8* 29.8*  PLT 431* 453*   BMET:  Recent Labs  10/23/15 1530 10/24/15 2230  NA 139 135  K 4.5 4.0  CL 101 99*  CO2 25 23  GLUCOSE 122* 201*  BUN 27* 16  CREATININE 5.66* 4.75*  CALCIUM 8.9 9.0   No results for input(s): PTH in the last 72 hours. Iron Studies: No results for input(s): IRON, TIBC, TRANSFERRIN, FERRITIN in the last 72 hours. Studies/Results: No results found.  Scheduled: . antiseptic oral rinse  7 mL Mouth Rinse BID  . aspirin EC  81 mg Oral Daily  . carbamide peroxide  5 drop Both Ears BID  . darbepoetin (ARANESP) injection - DIALYSIS  100 mcg Intravenous Q Tue-HD  . fluticasone furoate-vilanterol  1 puff Inhalation Daily  . insulin aspart  0-5 Units Subcutaneous QHS  . insulin aspart  0-9 Units Subcutaneous TID WC  . insulin detemir  20 Units Subcutaneous QHS  . magic mouthwash w/lidocaine  5 mL Oral QID  . midodrine  2.5 mg Oral BID WC  . montelukast  10 mg Oral QHS  . multivitamin  1 tablet Oral QHS  . pantoprazole  40 mg Oral BID  . warfarin  5 mg Oral  ONCE-1800  . Warfarin - Pharmacist Dosing Inpatient   Does not apply q1800     LOS: 13 days   Magali Bray C 10/25/2015,10:35 AM

## 2015-10-26 ENCOUNTER — Inpatient Hospital Stay (HOSPITAL_COMMUNITY): Payer: Self-pay | Admitting: Physical Therapy

## 2015-10-26 ENCOUNTER — Inpatient Hospital Stay (HOSPITAL_COMMUNITY): Payer: BLUE CROSS/BLUE SHIELD | Admitting: Speech Pathology

## 2015-10-26 ENCOUNTER — Inpatient Hospital Stay (HOSPITAL_COMMUNITY): Payer: BLUE CROSS/BLUE SHIELD | Admitting: Occupational Therapy

## 2015-10-26 LAB — CBC
HCT: 32.2 % — ABNORMAL LOW (ref 39.0–52.0)
Hemoglobin: 10.7 g/dL — ABNORMAL LOW (ref 13.0–17.0)
MCH: 28.3 pg (ref 26.0–34.0)
MCHC: 33.2 g/dL (ref 30.0–36.0)
MCV: 85.2 fL (ref 78.0–100.0)
Platelets: 456 10*3/uL — ABNORMAL HIGH (ref 150–400)
RBC: 3.78 MIL/uL — ABNORMAL LOW (ref 4.22–5.81)
RDW: 17.3 % — ABNORMAL HIGH (ref 11.5–15.5)
WBC: 20.5 10*3/uL — ABNORMAL HIGH (ref 4.0–10.5)

## 2015-10-26 LAB — HEPARIN LEVEL (UNFRACTIONATED)
Heparin Unfractionated: 0.29 IU/mL — ABNORMAL LOW (ref 0.30–0.70)
Heparin Unfractionated: 0.37 IU/mL (ref 0.30–0.70)
Heparin Unfractionated: 0.36 IU/mL (ref 0.30–0.70)

## 2015-10-26 LAB — GLUCOSE, CAPILLARY
Glucose-Capillary: 115 mg/dL — ABNORMAL HIGH (ref 65–99)
Glucose-Capillary: 279 mg/dL — ABNORMAL HIGH (ref 65–99)
Glucose-Capillary: 107 mg/dL — ABNORMAL HIGH (ref 65–99)
Glucose-Capillary: 197 mg/dL — ABNORMAL HIGH (ref 65–99)

## 2015-10-26 LAB — PROTIME-INR
INR: 1.15 (ref 0.00–1.49)
Prothrombin Time: 14.9 seconds (ref 11.6–15.2)

## 2015-10-26 MED ORDER — WARFARIN SODIUM 7.5 MG PO TABS
7.5000 mg | ORAL_TABLET | Freq: Once | ORAL | Status: AC
  Administered 2015-10-26: 7.5 mg via ORAL
  Filled 2015-10-26: qty 1

## 2015-10-26 NOTE — Plan of Care (Signed)
Problem: RH Toileting Goal: LTG Patient will perform toileting w/assist, cues/equip (OT) LTG: Patient will perform toiletiing (clothes management/hygiene) with assist, with/without cues using equipment (OT)  Goals upgraded secondary to greater progress than initially expected.

## 2015-10-26 NOTE — Progress Notes (Signed)
ANTICOAGULATION CONSULT NOTE - Follow Up Consult  Pharmacy Consult for Heparin  Indication: pulmonary embolus, DVT and stroke  Vital Signs: Temp: 98.4 F (36.9 C) (02/17 0541) Temp Source: Oral (02/17 0541) BP: 110/78 mmHg (02/17 0541) Pulse Rate: 91 (02/17 0541)  Labs:  Recent Labs  10/23/15 1530 10/24/15 1210  10/24/15 2230 10/25/15 0558 10/25/15 1615 10/26/15 0502  HGB 9.6*  --   --  10.3* 10.1*  --  10.7*  HCT 28.1*  --   --  29.8* 29.8*  --  32.2*  PLT 449*  --   --  431* 453*  --  456*  LABPROT  --  15.1  --   --  14.9  --  14.9  INR  --  1.17  --   --  1.15  --  1.15  HEPARINUNFRC  --  1.24*  < >  --  0.71* 0.61 0.29*  CREATININE 5.66*  --   --  4.75*  --  5.48*  --   < > = values in this interval not displayed.  Estimated Creatinine Clearance: 18.9 mL/min (by C-G formula based on Cr of 5.48).   Assessment: Heparin level is just below goal this AM  Goal of Therapy:  Heparin level 0.3-0.5 units/ml Monitor platelets by anticoagulation protocol: Yes   Plan:  -Increase heparin to 1600 units/hr -1400 HL  Maddyson Keil 10/26/2015,5:52 AM

## 2015-10-26 NOTE — Progress Notes (Signed)
ANTICOAGULATION CONSULT NOTE - Follow Up Consult  Pharmacy Consult for Coumadin Indication: pulmonary embolus, DVT and stroke  Allergies  Allergen Reactions  . Codeine Other (See Comments)    Pt unknown  . Codeine Rash and Other (See Comments)    Unknown reaction (patient says it was more serious than just a rash, but he can't remember what happened)    Patient Measurements: Height: 5' 11"  (180.3 cm) Weight: 214 lb 1.1 oz (97.1 kg) IBW/kg (Calculated) : 75.3   Vital Signs: Temp: 98.4 F (36.9 C) (02/17 1317) Temp Source: Oral (02/17 1317) BP: 116/87 mmHg (02/17 1317) Pulse Rate: 97 (02/17 1317)  Labs:  Recent Labs  10/24/15 1210  10/24/15 2230 10/25/15 0558 10/25/15 1615 10/26/15 0502 10/26/15 1404 10/26/15 1957  HGB  --   < > 10.3* 10.1*  --  10.7*  --   --   HCT  --   --  29.8* 29.8*  --  32.2*  --   --   PLT  --   --  431* 453*  --  456*  --   --   LABPROT 15.1  --   --  14.9  --  14.9  --   --   INR 1.17  --   --  1.15  --  1.15  --   --   HEPARINUNFRC 1.24*  < >  --  0.71* 0.61 0.29* 0.37 0.36  CREATININE  --   --  4.75*  --  5.48*  --   --   --   < > = values in this interval not displayed.  Estimated Creatinine Clearance: 18.9 mL/min (by C-G formula based on Cr of 5.48).   Medications:  Scheduled:  . antiseptic oral rinse  7 mL Mouth Rinse BID  . aspirin EC  81 mg Oral Daily  . carbamide peroxide  5 drop Both Ears BID  . darbepoetin (ARANESP) injection - DIALYSIS  100 mcg Intravenous Q Tue-HD  . fluticasone furoate-vilanterol  1 puff Inhalation Daily  . insulin aspart  0-5 Units Subcutaneous QHS  . insulin aspart  0-9 Units Subcutaneous TID WC  . insulin detemir  20 Units Subcutaneous QHS  . magic mouthwash w/lidocaine  5 mL Oral QID  . midodrine  2.5 mg Oral BID WC  . montelukast  10 mg Oral QHS  . multivitamin  1 tablet Oral QHS  . pantoprazole  40 mg Oral BID  . Warfarin - Pharmacist Dosing Inpatient   Does not apply q1800     Assessment: 50yo on heparin bridge to Coumadin.  INR unchanged this AM at 1.15.  Hg and pltc stable.  No bleeding noted. Heparin drip 1600 uts/hr HL 0.36 at goal.    Goal of Therapy:  Heparin level 0.3-0.5 units/ml INR 2-2.5 Monitor platelets by anticoagulation protocol: Yes   Plan:  Continue Heparin drip 1600 uts/hr Daily INR, HL, CBC   Bonnita Nasuti Pharm.D. CPP, BCPS Clinical Pharmacist [phone] 10/26/2015 9:06 PM

## 2015-10-26 NOTE — Progress Notes (Signed)
Occupational Therapy Weekly Progress Note  Patient Details  Name: Isaiah [name] MRN: [id number] Date of Birth: 09-06-1966  Beginning of progress report period: October 20, 2015 End of progress report period: October 26, 2015  Today's Date: 10/26/2015 OT Individual Time: 1638-4665 OT Individual Time Calculation (min): 73 min    Patient has met 5 of 5 short term goals.  He has made excellent progress with OT over the past week.  Currently he is at a min assist level for all LB selfcare sit to stand, functional transfers, and mobility to and from the bathroom with the RW.  RUE function continues to improve and currently he can use the arm and hand with mod assist during basic selfcare tasks.  Brunnstrum stage V level is present at this time.  Decreased full digit flexion and extension still impair his ability to hold items such as his pants when attempting to pull them up or when attempting to hold socks for donning.  He has been tolerating NMES as well for the right hand without any adverse reactions.  Do note slight difficulty still with understanding directions completely at times as well as needing min instructional cueing for safe sequencing, such as remembering to put soap on the washcloth and being able to distinguish between both left and right.  Overall Isaiah [name] progress has been outstanding and he has reached current goals.  Feel he can make even further progress and will update all goals to supervision level in anticipation of discharge home with family next week.   Patient continues to demonstrate the following deficits:  Decreased balance, decreased RUE functional use, decreased balance, expressive and receptive difficulties, and therefore will continue to benefit from skilled OT intervention to enhance overall performance with BADL.  Patient progressing toward long term goals..  Continue plan of care.  OT Short Term Goals Week 3:  OT Short Term Goal 1 (Week 3): Continue working  toward estabilished supervision level goals.  Skilled Therapeutic Interventions/Progress Updates:    Pt completed bathing and dressing during session sit to stand at the sink.  He was able to use the RUE with mod assist for washing the LUE and for assisting with pulling up pants and brief.  He was not able to use it however for tying his pants, and therapist had to assist with this.  He needed overall min assist level for most of these tasks with the exception of UB dressing, which was completed at supervision level.  Finished second part of session in the [facility] gym using the [employer].  Had pt work in standing on using the RUE with min to mod assist to knock out the red lights.  Times averaged 5-8 seconds over a 60 second time frame.  When performing with the LUE he still averaged between 4-5 seconds.  Demonstrates slower cognitive processing as well as possible visual/visual processing deficits.  Returned to room at end of session with call button and phone within reach.  Safety belt also applied.   Therapy Documentation Precautions:  Precautions Precautions: Fall Precaution Comments: expressive/ receptive difficulties Restrictions Weight Bearing Restrictions: No  Pain: Pain Assessment Pain Assessment: No/denies pain ADL: See Function Navigator for Current Functional Status.   Therapy/Group: Individual Therapy  Kadasia Kassing OTR/L 10/26/2015, 12:32 PM

## 2015-10-26 NOTE — Progress Notes (Signed)
Speech Language Pathology Weekly Progress and Session Note  Patient Details  Name: Isaiah [name] [facility] MRN: [id number] Date of Birth: Dec 09, 1965  Beginning of progress report period: October 19, 2015   End of progress report period: October 26, 2015   Today's Date: 10/26/2015 SLP Individual Time: 0803-0900 SLP Individual Time Calculation (min): 57 min  Short Term Goals: Week 2: SLP Short Term Goal 1 (Week 2): Pt will consume dys 2 textures and thin liquids with minimal overt s/s of aspiration and supervision for use of swallowing precautions over 2 targeted sessions prior to advancement.   SLP Short Term Goal 1 - Progress (Week 2): Met SLP Short Term Goal 2 (Week 2): Pt will self-monitor and correct verbal perseveration with mod assist multimodal cues. SLP Short Term Goal 2 - Progress (Week 2): Met SLP Short Term Goal 3 (Week 2): Pt will name familiar objects with min assist semantic cues.  SLP Short Term Goal 3 - Progress (Week 2): Met SLP Short Term Goal 4 (Week 2): Pt will utilize over articulation for production of phonemes at the CV, CVC/word level for accurate production of vowels and bilabials with Min verbal, visual cues  SLP Short Term Goal 4 - Progress (Week 2): Met SLP Short Term Goal 5 (Week 2): Pt will verbalize at the phrase level with mod assist verbal cues for 75% accuracy SLP Short Term Goal 5 - Progress (Week 2): Met    New Short Term Goals: Week 3: SLP Short Term Goal 1 (Week 3): Pt will consume dys 3 textures and thin liquids with minimal overt s/s of aspiration and mod I use of swallowing precautions over 2 targeted sessions prior to advancement.   SLP Short Term Goal 2 (Week 3): Pt will self-monitor and correct verbal perseveration with min assist multimodal cues. SLP Short Term Goal 3 (Week 3): Pt will utilize over articulation for production of phonemes at the CV, CVC phrase level for accurate production of vowels and bilabials with Min verbal, visual cues  SLP Short  Term Goal 4 (Week 3): Pt will verbalize at the sentence level with min assist verbal cues for 75% accuracy SLP Short Term Goal 5 (Week 3): Pt will utilize compensatory word finding strategies during basic verbal tasks to faciltiate functional communication with min assist verbal cues.    Weekly Progress Updates:  Pt made functional gains this reporting period and is discharging having met 5 out of 5 short term goals.  Pt is currently mod assist for communication at the phrase level with some emerging sentence level expression.  He has demonstrated improved awareness and ability to correct verbal perseveration.  Pt's diet has also been upgraded to dys 3 textures and thin liquids with intermittent supervision for use of swallowing precautions.  Pt would continue to benefit from skilled ST while inpatient in order to maximize functional independence and reduce burden of care prior to discharge. Pt and family education is ongoing.     Intensity: Minumum of 1-2 x/day, 30 to 90 minutes Frequency: 3 to 5 out of 7 days Duration/Length of Stay: 17-21 days Treatment/Interventions: Cognitive remediation/compensation;Cueing hierarchy;Dysphagia/aspiration precaution training;Environmental controls;Functional tasks;Internal/external aids;Patient/family education;Speech/Language facilitation;Therapeutic Exercise   Daily Session  Skilled Therapeutic Interventions: Pt was seen for skilled ST targeting goals for dysphagia and communication.  SLP completed skilled observations during presentations of his currently prescribed diet.  Pt consumed dys 3 textures and thin liquids with overall supervision/set up assist.  No overt s/s of aspiration were evident with solids or liquids.  Pt required mod assist verbal cues for word finding during a categorical naming activity.  He was able to correct verbal errors with overall mod assist verbal cues and extra time.  Functional communication was improved during loosely structured  conversations with SLP and pt was able to convey his needs and wants with therapist at the phrase level with min assist verbal cues.  Pt was returned to room and left with call bell within reach and nursing present.  Goals updated on this date to reflect current progress and plan of care.       Function:   Eating Eating   Modified Consistency Diet: Yes Eating Assist Level: More than reasonable amount of time;Set up assist for   Eating Set Up Assist For: Opening containers       Cognition Comprehension Comprehension assist level: Understands complex 90% of the time/cues 10% of the time  Expression   Expression assist level: Expresses basic 50 - 74% of the time/requires cueing 25 - 49% of the time. Needs to repeat parts of sentences.  Social Interaction Social Interaction assist level: Interacts appropriately 90% of the time - Needs monitoring or encouragement for participation or interaction.  Problem Solving Problem solving assist level: Solves basic 75 - 89% of the time/requires cueing 10 - 24% of the time  Memory Memory assist level: Recognizes or recalls 75 - 89% of the time/requires cueing 10 - 24% of the time   General    Pain Pain Assessment Pain Assessment: No/denies pain  Therapy/Group: Individual Therapy  Marilin Kofman, Selinda Orion 10/26/2015, 12:26 PM

## 2015-10-26 NOTE — Progress Notes (Signed)
Physical Therapy Session Note  Patient Details  Name: Isaiah [name] [facility] MRN: [id number] Date of Birth: May 27, 1966  Today's Date: 10/26/2015 PT Individual Time: 1305-1400 PT Individual Time Calculation (min): 55 min   Short Term Goals: Week 2:  PT Short Term Goal 1 (Week 2): Pt will perform bed mobility from flat bed consistently with supervision PT Short Term Goal 2 (Week 2): Pt will perform bed <> chair transfers with consistent supervision PT Short Term Goal 3 (Week 2): Pt will perform stair negotiation up/down 8 stairs with two rails and min A PT Short Term Goal 4 (Week 2): Pt will perform gait with LRAD in controlled environment x 150' with consistent min A PT Short Term Goal 5 (Week 2): Pt will perform car transfer with supervision  Skilled Therapeutic Interventions/Progress Updates:    Pt received resting in bed with no c/o pain and agreeable to therapy.  Supine>sit with HOB elevated, bed rails, and verbal cues for scoot to edge of bed.  Stand/pivot to w/c with supervision for balance.  Pt propelled w/c to therapy gym with supervision using BLEs x100' and UEs/LEs x60' for endurance and NMR.  PT instructed pt in Nustep at level 7 x10 minutes with 1 rest break at 7 minutes for reciprocal movement NMR, strengthening, and overall endurance.  PT instructed pt in forward reaching activity in // bars, reaching for horse shoes.  Pt reporting increased dizziness and fatigue after 5 forward reaches.  PT instructed pt in head>knee x2 on R and on L, and with L/R head turns, each provoking dizziness. Pt states "I had a stroke and that can cause dizziness."  May benefit from vestibular evaluation.  PT instructed pt in amb x160' back to room with RW, supervision, and verbal cues for safety during turns and for walker positioning.  Pt positioned supine in bed at end of session with supervision and left with call bell in reach and needs met.   Therapy Documentation Precautions:  Precautions Precautions:  Fall Precaution Comments: expressive/ receptive difficulties Restrictions Weight Bearing Restrictions: No  See Function Navigator for Current Functional Status.   Therapy/Group: Individual Therapy  [name] 10/26/2015, 2:11 PM

## 2015-10-26 NOTE — Progress Notes (Addendum)
ANTICOAGULATION CONSULT NOTE - Follow Up Consult  Pharmacy Consult for Coumadin Indication: pulmonary embolus, DVT and stroke  Allergies  Allergen Reactions  . Codeine Other (See Comments)    Pt unknown  . Codeine Rash and Other (See Comments)    Unknown reaction (patient says it was more serious than just a rash, but he can't remember what happened)    Patient Measurements: Height: 5' 11"  (180.3 cm) Weight: 214 lb 1.1 oz (97.1 kg) IBW/kg (Calculated) : 75.3 Heparin Dosing Weight:   Vital Signs: Temp: 98.4 F (36.9 C) (02/17 0541) Temp Source: Oral (02/17 0541) BP: 110/78 mmHg (02/17 0541) Pulse Rate: 91 (02/17 0541)  Labs:  Recent Labs  10/23/15 1530 10/24/15 1210  10/24/15 2230 10/25/15 0558 10/25/15 1615 10/26/15 0502  HGB 9.6*  --   --  10.3* 10.1*  --  10.7*  HCT 28.1*  --   --  29.8* 29.8*  --  32.2*  PLT 449*  --   --  431* 453*  --  456*  LABPROT  --  15.1  --   --  14.9  --  14.9  INR  --  1.17  --   --  1.15  --  1.15  HEPARINUNFRC  --  1.24*  < >  --  0.71* 0.61 0.29*  CREATININE 5.66*  --   --  4.75*  --  5.48*  --   < > = values in this interval not displayed.  Estimated Creatinine Clearance: 18.9 mL/min (by C-G formula based on Cr of 5.48).   Medications:  Scheduled:  . antiseptic oral rinse  7 mL Mouth Rinse BID  . aspirin EC  81 mg Oral Daily  . carbamide peroxide  5 drop Both Ears BID  . darbepoetin (ARANESP) injection - DIALYSIS  100 mcg Intravenous Q Tue-HD  . fluticasone furoate-vilanterol  1 puff Inhalation Daily  . insulin aspart  0-5 Units Subcutaneous QHS  . insulin aspart  0-9 Units Subcutaneous TID WC  . insulin detemir  20 Units Subcutaneous QHS  . magic mouthwash w/lidocaine  5 mL Oral QID  . midodrine  2.5 mg Oral BID WC  . montelukast  10 mg Oral QHS  . multivitamin  1 tablet Oral QHS  . pantoprazole  40 mg Oral BID  . Warfarin - Pharmacist Dosing Inpatient   Does not apply q1800    Assessment: 50yo on heparin bridge to  Coumadin.  INR unchanged this AM at 1.15.  Hg and pltc stable.  No bleeding noted.  Heparin adjusted this AM for low HL, awaiting repeat level.  Goal of Therapy:  Heparin level 0.3-0.5 units/ml INR 2-2.5 Monitor platelets by anticoagulation protocol: Yes   Plan:  Coumadin 7.37m today F/U Heparin level Daily HL, CBC, INR Watch for s/s of bleeding  KGracy Bruins PharmD [facility]   10/26/15  Pharmacy- follow up 1605  Heparin level 0.37  A/P:  Heparin level is within goal range on 1600 units/hr.  No bleeding noted.  Will continue current rate and repeat HL in 6hr to verify therapeutic x 2.  KGracy Bruins PharmD Clinical Pharmacist [facility]

## 2015-10-26 NOTE — Progress Notes (Signed)
Subjective/Complaints: Remains a phasic however he is improving with some sentence level expressive language. Right upper extremity function remains limited but has no pain. Discussed with patient's wife rehabilitation medicine update. She is requesting HIV because of some blood transfusions at [name] in [place]. We discussed that blood blank screening procedures are uniform and that risk of HIV would be exceedingly rare. She may discuss this with his other physicians as well  ROS- appears to deny CP, SOB, N/V/D  Objective: Vital Signs: Blood pressure 110/78, pulse 91, temperature 98.4 F (36.9 C), temperature source Oral, resp. rate 18, height _0  (1.803 m), weight 97.1 kg (214 lb 1.1 oz), SpO2 98 %. No results found. Results for orders placed or performed during the hospital encounter of 10/12/15 (from the past 72 hour(s))  CBC     Status: Abnormal   Collection Time: 10/23/15  9:46 AM  Result Value Ref Range   WBC 15.3 (H) 4.0 - 10.5 K/uL   RBC 3.72 (L) 4.22 - 5.81 MIL/uL   Hemoglobin 10.6 (L) 13.0 - 17.0 g/dL   HCT 31.9 (L) 39.0 - 52.0 %   MCV 85.8 78.0 - 100.0 fL   MCH 28.5 26.0 - 34.0 pg   MCHC 33.2 30.0 - 36.0 g/dL   RDW 17.5 (H) 11.5 - 15.5 %   Platelets 476 (H) 150 - 400 K/uL  Heparin level (unfractionated)     Status: None   Collection Time: 10/23/15  9:46 AM  Result Value Ref Range   Heparin Unfractionated 0.41 0.30 - 0.70 IU/mL    Comment:        IF HEPARIN RESULTS ARE BELOW EXPECTED VALUES, AND PATIENT DOSAGE HAS BEEN CONFIRMED, SUGGEST FOLLOW UP TESTING OF ANTITHROMBIN III LEVELS.   Glucose, capillary     Status: Abnormal   Collection Time: 10/23/15 11:51 AM  Result Value Ref Range   Glucose-Capillary 221 (H) 65 - 99 mg/dL  Renal function panel     Status: Abnormal   Collection Time: 10/23/15  3:30 PM  Result Value Ref Range   Sodium 139 135 - 145 mmol/L   Potassium 4.5 3.5 - 5.1 mmol/L   Chloride 101 101 - 111 mmol/L   CO2  25 22 - 32 mmol/L   Glucose, Bld 122 (H) 65 - 99 mg/dL   BUN 27 (H) 6 - 20 mg/dL   Creatinine, Ser 5.66 (H) 0.61 - 1.24 mg/dL   Calcium 8.9 8.9 - 10.3 mg/dL   Phosphorus 3.7 2.5 - 4.6 mg/dL   Albumin 2.2 (L) 3.5 - 5.0 g/dL   GFR calc non Af Amer 10 (L) >60 mL/min   GFR calc Af Amer 12 (L) >60 mL/min    Comment: (NOTE) The eGFR has been calculated using the CKD EPI equation. This calculation has not been validated in all clinical situations. eGFR's persistently <60 mL/min signify possible Chronic Kidney Disease.    Anion gap 13 5 - 15  CBC     Status: Abnormal   Collection Time: 10/23/15  3:30 PM  Result Value Ref Range   WBC 15.2 (H) 4.0 - 10.5 K/uL   RBC 3.27 (L) 4.22 - 5.81 MIL/uL   Hemoglobin 9.6 (L) 13.0 - 17.0 g/dL   HCT 28.1 (L) 39.0 - 52.0 %   MCV 85.9 78.0 - 100.0 fL   MCH 29.4 26.0 - 34.0 pg   MCHC 34.2 30.0 - 36.0 g/dL   RDW 17.5 (H) 11.5 - 15.5 %   Platelets 449 (  H) 150 - 400 K/uL  Glucose, capillary     Status: None   Collection Time: 10/23/15  4:21 PM  Result Value Ref Range   Glucose-Capillary 96 65 - 99 mg/dL  Glucose, capillary     Status: Abnormal   Collection Time: 10/23/15  8:37 PM  Result Value Ref Range   Glucose-Capillary 114 (H) 65 - 99 mg/dL  Glucose, capillary     Status: Abnormal   Collection Time: 10/24/15  6:45 AM  Result Value Ref Range   Glucose-Capillary 129 (H) 65 - 99 mg/dL  Glucose, capillary     Status: Abnormal   Collection Time: 10/24/15 11:52 AM  Result Value Ref Range   Glucose-Capillary 224 (H) 65 - 99 mg/dL   Comment 1 Notify RN   Heparin level (unfractionated)     Status: Abnormal   Collection Time: 10/24/15 12:10 PM  Result Value Ref Range   Heparin Unfractionated 1.24 (H) 0.30 - 0.70 IU/mL    Comment: RESULTS CONFIRMED BY MANUAL DILUTION        IF HEPARIN RESULTS ARE BELOW EXPECTED VALUES, AND PATIENT DOSAGE HAS BEEN CONFIRMED, SUGGEST FOLLOW UP TESTING OF ANTITHROMBIN III LEVELS.   Lipid panel     Status: None    Collection Time: 10/24/15 12:10 PM  Result Value Ref Range   Cholesterol 128 0 - 200 mg/dL   Triglycerides 88 <150 mg/dL   HDL 48 >40 mg/dL   Total CHOL/HDL Ratio 2.7 RATIO   VLDL 18 0 - 40 mg/dL   LDL Cholesterol 62 0 - 99 mg/dL    Comment:        Total Cholesterol/HDL:CHD Risk Coronary Heart Disease Risk Table                     Men   Women  1/2 Average Risk   3.4   3.3  Average Risk       5.0   4.4  2 X Average Risk   9.6   7.1  3 X Average Risk  23.4   11.0        Use the calculated Patient Ratio above and the CHD Risk Table to determine the patient's CHD Risk.        ATP III CLASSIFICATION (LDL):  <100     mg/dL   Optimal  100-129  mg/dL   Near or Above                    Optimal  130-159  mg/dL   Borderline  160-189  mg/dL   High  >190     mg/dL   Very High   Hemoglobin A1c     Status: Abnormal   Collection Time: 10/24/15 12:10 PM  Result Value Ref Range   Hgb A1c MFr Bld 6.6 (H) 4.8 - 5.6 %    Comment: (NOTE)         Pre-diabetes: 5.7 - 6.4         Diabetes: >6.4         Glycemic control for adults with diabetes: <7.0    Mean Plasma Glucose 143 mg/dL    Comment: (NOTE) Performed At: [facility] [place], [place] [id number] Lindon Romp MD [id number]   TSH     Status: None   Collection Time: 10/24/15 12:10 PM  Result Value Ref Range   TSH 2.018 0.350 - 4.500 uIU/mL  Vitamin B12     Status: None  Collection Time: 10/24/15 12:10 PM  Result Value Ref Range   Vitamin B-12 801 180 - 914 pg/mL    Comment: (NOTE) This assay is not validated for testing neonatal or myeloproliferative syndrome specimens for Vitamin B12 levels.   Folate     Status: None   Collection Time: 10/24/15 12:10 PM  Result Value Ref Range   Folate 16.9 >5.9 ng/mL  Protime-INR     Status: None   Collection Time: 10/24/15 12:10 PM  Result Value Ref Range   Prothrombin Time 15.1 11.6 - 15.2 seconds   INR 1.17 0.00 - 1.49  Heparin level (unfractionated)      Status: None   Collection Time: 10/24/15  3:39 PM  Result Value Ref Range   Heparin Unfractionated 0.43 0.30 - 0.70 IU/mL    Comment:        IF HEPARIN RESULTS ARE BELOW EXPECTED VALUES, AND PATIENT DOSAGE HAS BEEN CONFIRMED, SUGGEST FOLLOW UP TESTING OF ANTITHROMBIN III LEVELS.   Glucose, capillary     Status: Abnormal   Collection Time: 10/24/15  4:51 PM  Result Value Ref Range   Glucose-Capillary 132 (H) 65 - 99 mg/dL   Comment 1 Notify RN   Glucose, capillary     Status: Abnormal   Collection Time: 10/24/15  9:00 PM  Result Value Ref Range   Glucose-Capillary 187 (H) 65 - 99 mg/dL   Comment 1 Notify RN   Renal function panel     Status: Abnormal   Collection Time: 10/24/15 10:30 PM  Result Value Ref Range   Sodium 135 135 - 145 mmol/L   Potassium 4.0 3.5 - 5.1 mmol/L   Chloride 99 (L) 101 - 111 mmol/L   CO2 23 22 - 32 mmol/L   Glucose, Bld 201 (H) 65 - 99 mg/dL   BUN 16 6 - 20 mg/dL   Creatinine, Ser 4.75 (H) 0.61 - 1.24 mg/dL   Calcium 9.0 8.9 - 10.3 mg/dL   Phosphorus 3.9 2.5 - 4.6 mg/dL   Albumin 2.6 (L) 3.5 - 5.0 g/dL   GFR calc non Af Amer 13 (L) >60 mL/min   GFR calc Af Amer 15 (L) >60 mL/min    Comment: (NOTE) The eGFR has been calculated using the CKD EPI equation. This calculation has not been validated in all clinical situations. eGFR's persistently <60 mL/min signify possible Chronic Kidney Disease.    Anion gap 13 5 - 15  CBC     Status: Abnormal   Collection Time: 10/24/15 10:30 PM  Result Value Ref Range   WBC 17.9 (H) 4.0 - 10.5 K/uL   RBC 3.52 (L) 4.22 - 5.81 MIL/uL   Hemoglobin 10.3 (L) 13.0 - 17.0 g/dL   HCT 29.8 (L) 39.0 - 52.0 %   MCV 84.7 78.0 - 100.0 fL   MCH 29.3 26.0 - 34.0 pg   MCHC 34.6 30.0 - 36.0 g/dL   RDW 17.6 (H) 11.5 - 15.5 %   Platelets 431 (H) 150 - 400 K/uL  Heparin level (unfractionated)     Status: Abnormal   Collection Time: 10/25/15  5:58 AM  Result Value Ref Range   Heparin Unfractionated 0.71 (H) 0.30 - 0.70  IU/mL    Comment:        IF HEPARIN RESULTS ARE BELOW EXPECTED VALUES, AND PATIENT DOSAGE HAS BEEN CONFIRMED, SUGGEST FOLLOW UP TESTING OF ANTITHROMBIN III LEVELS.   CBC     Status: Abnormal   Collection Time: 10/25/15  5:58 AM  Result  Value Ref Range   WBC 16.6 (H) 4.0 - 10.5 K/uL   RBC 3.53 (L) 4.22 - 5.81 MIL/uL   Hemoglobin 10.1 (L) 13.0 - 17.0 g/dL   HCT 29.8 (L) 39.0 - 52.0 %   MCV 84.4 78.0 - 100.0 fL   MCH 28.6 26.0 - 34.0 pg   MCHC 33.9 30.0 - 36.0 g/dL   RDW 17.6 (H) 11.5 - 15.5 %   Platelets 453 (H) 150 - 400 K/uL  Protime-INR     Status: None   Collection Time: 10/25/15  5:58 AM  Result Value Ref Range   Prothrombin Time 14.9 11.6 - 15.2 seconds   INR 1.15 0.00 - 1.49  Glucose, capillary     Status: Abnormal   Collection Time: 10/25/15  6:41 AM  Result Value Ref Range   Glucose-Capillary 138 (H) 65 - 99 mg/dL   Comment 1 Notify RN   Glucose, capillary     Status: Abnormal   Collection Time: 10/25/15 11:16 AM  Result Value Ref Range   Glucose-Capillary 280 (H) 65 - 99 mg/dL  Heparin level (unfractionated)     Status: None   Collection Time: 10/25/15  4:15 PM  Result Value Ref Range   Heparin Unfractionated 0.61 0.30 - 0.70 IU/mL    Comment:        IF HEPARIN RESULTS ARE BELOW EXPECTED VALUES, AND PATIENT DOSAGE HAS BEEN CONFIRMED, SUGGEST FOLLOW UP TESTING OF ANTITHROMBIN III LEVELS.   Renal function panel     Status: Abnormal   Collection Time: 10/25/15  4:15 PM  Result Value Ref Range   Sodium 137 135 - 145 mmol/L   Potassium 3.6 3.5 - 5.1 mmol/L   Chloride 102 101 - 111 mmol/L   CO2 24 22 - 32 mmol/L   Glucose, Bld 140 (H) 65 - 99 mg/dL   BUN 20 6 - 20 mg/dL   Creatinine, Ser 5.48 (H) 0.61 - 1.24 mg/dL   Calcium 9.0 8.9 - 10.3 mg/dL   Phosphorus 3.3 2.5 - 4.6 mg/dL   Albumin 2.7 (L) 3.5 - 5.0 g/dL   GFR calc non Af Amer 11 (L) >60 mL/min   GFR calc Af Amer 13 (L) >60 mL/min    Comment: (NOTE) The eGFR has been calculated using the CKD EPI  equation. This calculation has not been validated in all clinical situations. eGFR's persistently <60 mL/min signify possible Chronic Kidney Disease.    Anion gap 11 5 - 15  Glucose, capillary     Status: None   Collection Time: 10/25/15  9:00 PM  Result Value Ref Range   Glucose-Capillary 83 65 - 99 mg/dL  Heparin level (unfractionated)     Status: Abnormal   Collection Time: 10/26/15  5:02 AM  Result Value Ref Range   Heparin Unfractionated 0.29 (L) 0.30 - 0.70 IU/mL    Comment:        IF HEPARIN RESULTS ARE BELOW EXPECTED VALUES, AND PATIENT DOSAGE HAS BEEN CONFIRMED, SUGGEST FOLLOW UP TESTING OF ANTITHROMBIN III LEVELS.   CBC     Status: Abnormal   Collection Time: 10/26/15  5:02 AM  Result Value Ref Range   WBC 20.5 (H) 4.0 - 10.5 K/uL   RBC 3.78 (L) 4.22 - 5.81 MIL/uL   Hemoglobin 10.7 (L) 13.0 - 17.0 g/dL   HCT 32.2 (L) 39.0 - 52.0 %   MCV 85.2 78.0 - 100.0 fL   MCH 28.3 26.0 - 34.0 pg   MCHC 33.2 30.0 - 36.0  g/dL   RDW 17.3 (H) 11.5 - 15.5 %   Platelets 456 (H) 150 - 400 K/uL  Protime-INR     Status: None   Collection Time: 10/26/15  5:02 AM  Result Value Ref Range   Prothrombin Time 14.9 11.6 - 15.2 seconds   INR 1.15 0.00 - 1.49  Glucose, capillary     Status: Abnormal   Collection Time: 10/26/15  6:42 AM  Result Value Ref Range   Glucose-Capillary 115 (H) 65 - 99 mg/dL    General: No acute distress. Vital signs reviewed Psych: Mood and affect are appropriate Heart: Regular rate and rhythm  Lungs: Clear to auscultation, breathing unlabored, no rales or wheezes Abdomen: Positive bowel sounds, soft nontender to palpation, nondistended Skin: No evidence of breakdown, no evidence of rash Neurologic:  Motor  RUE/RLE: 5/5 proximal distal LUE: 4 -/5 proximal distal LLE: hip flexion, knee extension 4/5, 3-/5 ankle dorsi/plantar flexion MAS: Right wrist flexion 1+/5, elbow flexion 1/5 Musculoskeletal: No TTP. No joint swelling. No edema  Assessment/Plan: 1.  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 Physiatrist is providing close team supervision and 24 hour management of active medical problems listed below. Physiatrist and rehab team continue to assess barriers to discharge/monitor patient progress toward functional and medical goals. FIM: Function - Bathing Position: Wheelchair/chair at sink Body parts bathed by patient: Right arm, Chest, Abdomen, Front perineal area, Buttocks, Right upper leg, Left upper leg, Right lower leg, Left lower leg Body parts bathed by helper: Left arm, Back Bathing not applicable: Front perineal area, Buttocks, Right upper leg, Left upper leg, Right lower leg, Left lower leg Assist Level:  (min A)  Function- Upper Body Dressing/Undressing What is the patient wearing?: Pull over shirt/dress Pull over shirt/dress - Perfomed by patient: Put head through opening, Pull shirt over trunk, Thread/unthread right sleeve Pull over shirt/dress - Perfomed by helper: Thread/unthread left sleeve Assist Level:  (mod A) Function - Lower Body Dressing/Undressing What is the patient wearing?: Shoes Position: Wheelchair/chair at sink Pants- Performed by patient: Thread/unthread left pants leg, Thread/unthread right pants leg Pants- Performed by helper: Pull pants up/down Non-skid slipper socks- Performed by helper: Don/doff right sock, Don/doff left sock Socks - Performed by patient: Don/doff right sock, Don/doff left sock Shoes - Performed by patient: Don/doff right shoe, Don/doff left shoe Shoes - Performed by helper: Don/doff right shoe, Don/doff left shoe Assist for footwear: Dependant Assist for lower body dressing: Touching or steadying assistance (Pt > 75%)  Function - Toileting Toileting steps completed by patient: Adjust clothing prior to toileting,  Performs perineal hygiene, Adjust clothing after toileting Toileting steps completed by helper: Adjust clothing after toileting Toileting Assistive Devices: Grab bar or rail Assist level: Touching or steadying assistance (Pt.75%)  Function - Air cabin crew transfer assistive device: Grab bar Assist level to toilet: Touching or steadying assistance (Pt > 75%) Assist level from toilet: Touching or steadying assistance (Pt > 75%)  Function - Chair/bed transfer Chair/bed transfer method: Stand pivot Chair/bed transfer assist level: Touching or steadying assistance (Pt > 75%) Chair/bed transfer assistive device: Armrests, Walker Chair/bed transfer details: Verbal cues for safe use of DME/AE  Function - Locomotion: Wheelchair Will patient use wheelchair at discharge?: No Type: Manual Max wheelchair distance: 150 Assist Level: Supervision or verbal cues Wheel 50 feet with 2 turns activity did not occur: Safety/medical concerns Assist  Level: Supervision or verbal cues Wheel 150 feet activity did not occur: Safety/medical concerns Assist Level: Supervision or verbal cues Turns around,maneuvers to table,bed, and toilet,negotiates 3% grade,maneuvers on rugs and over doorsills: No Function - Locomotion: Ambulation Ambulation activity did not occur: Safety/medical concerns Assistive device: Walker-rolling, Orthosis Max distance: 150 Assist level: Touching or steadying assistance (Pt > 75%) Assist level: Touching or steadying assistance (Pt > 75%) Assist level: Touching or steadying assistance (Pt > 75%) Walk 150 feet activity did not occur: Safety/medical concerns Assist level: Touching or steadying assistance (Pt > 75%) Walk 10 feet on uneven surfaces activity did not occur: Safety/medical concerns  Function - Comprehension Comprehension: Auditory Comprehension assist level: Understands complex 90% of the time/cues 10% of the time  Function - Expression Expression:  Verbal Expression assist level: Expresses basic 75 - 89% of the time/requires cueing 10 - 24% of the time. Needs helper to occlude trach/needs to repeat words.  Function - Social Interaction Social Interaction assist level: Interacts appropriately 90% of the time - Needs monitoring or encouragement for participation or interaction.  Function - Problem Solving Problem solving assist level: Solves basic 75 - 89% of the time/requires cueing 10 - 24% of the time  Function - Memory Memory assist level: Recognizes or recalls 75 - 89% of the time/requires cueing 10 - 24% of the time Patient normally able to recall (first 3 days only): Staff names and faces, That he or she is in a hospital  Medical Problem List and Plan: 1. Right hemiparesis and aphasia secondary to bi-cerebral embolic infarcts (H>K)-[id number] ultrasounds demonstrated 1-39% stenosis bilateral ICA no other abnormalities noted PE/DVT/Stoke/Anticoagulation: Pharmaceutical: Lovenox bid per records from [place].  use iv heparin gtt at this time. per hematologist Dr Alen Blew and neuro resume warfarin-goal INR 2.0-2.5, currently INR 1.15 3. Pain Management: Ultram prn.  4. Mood: LCSW to follow for evaluation and support.  5. Neuropsych: This patient is not fully capable of making decisions on his own behalf. 6. Skin/Wound Care: Routine pressure relief measures.  7. Fluids/Electrolytes/Nutrition: Renal restrictions. 1200 cc/FR.  8. DM type 2: Monitor BS ac/hs. Continue levemir and use SSI for elevated BS.   Fasting CBG 115- acceptable  continue current dose Levemir   9. HTN: Monitor BP bid and titrate medications as needed. Elevated at admission. Parameters on Midodrine.  10. ESRD on HD: per [facility]. Continue diet/volume mgt, Tunnel dialysis catheter Placed. Appreciate nephrology recs 11. Leukocytosis: no breathing difficulties or coughing. UA negative except for elevated protein as expected from renal failure    12.  Hx of GI  bleed will monitor for reurrence,Hgb stable at 10.7 on 2/17on daily CBC given warfarin and hep  13. Dysphagia- Per speech therapy, on D2 solids, thin liq, ? Upgrade to D3  LOS (Days) 14 A FACE TO FACE EVALUATION WAS PERFORMED  Kynzli Rease E 10/26/2015, 8:29 AM

## 2015-10-26 NOTE — Progress Notes (Signed)
Nutrition Brief Note  RD contacted via RN regarding pt requests for additional snacks as he still reports hunger. Snacks have been ordered. Wife at bedside requests a diet education prior to discharge. RD to re-visit next week to give diet education to wife and patient. Labs and medications reviewed. Pt currently on a dysphagia 3 diet renal/carb modified and has been eating 100% at meals. No further nutrition interventions at this time.   Corrin Parker, MS, RD, LDN Pager # [phone] After hours/ weekend pager # [phone]

## 2015-10-26 NOTE — Patient Care Conference (Signed)
Inpatient RehabilitationTeam Conference and Plan of Care Update Date: 10/24/2015   Time: 10:45 AM    Patient Name: Isaiah [name] [facility]      Medical Record Number: [id number]  Date of Birth: [birth year] Sex: Male         Room/Bed: 4W05C/4W05C-01 Payor Info: Payor: BLUE CROSS BLUE SHIELD / Plan: BCBS OTHER / Product Type: *No Product type* /    Admitting Diagnosis: CVA   New ESRD Chronic kidney disease   Admit Date/Time:  10/12/2015  5:38 PM Admission Comments: No comment available   Primary Diagnosis:  Hemiparesis, aphasia, and dysphagia as late effects of cerebrovascular accident [facility]) Principal Problem: Hemiparesis, aphasia, and dysphagia as late effects of cerebrovascular accident [facility])  Patient Active Problem List   Diagnosis Date Noted  . ESRD (end stage renal disease) on dialysis ([place])   . S/P dialysis catheter insertion ([place])   . Acute deep vein thrombosis (DVT) of popliteal vein of left lower extremity ([place])   . Leukocytosis   . Labile blood glucose   . Acute blood loss anemia   . Hemiparesis, aphasia, and dysphagia as late effects of cerebrovascular accident ([place]) 10/13/2015  . Gait disturbance, post-stroke 10/13/2015  . Embolic stroke ([place]) 17/61/6073  . Plasma cell disorder   . Anemia 10/31/2014  . Long term current use of anticoagulant therapy 10/31/2014  . Renal insufficiency 10/31/2014  . Iron deficiency anemia due to chronic blood loss 10/30/2014  . Encounter for therapeutic drug monitoring 10/11/2014  . Pulmonary embolus ([place]) 09/27/2014  . ESRD on dialysis ([place]) 09/27/2014  . MGUS (monoclonal gammopathy of unknown significance) 09/27/2014  . OSA on CPAP 09/27/2014  . Diabetic retinopathy of both eyes ([place]) 02/03/2012  . Sickle cell trait ([place]) 07/19/2011  . Morbid obesity ([place]) 07/19/2011  . PE (pulmonary embolism) 07/18/2011  . SOB (shortness of breath) 07/18/2011  . Hyperkalemia 07/18/2011  . ARF (acute renal failure) ([place]) 07/18/2011  . Leukocytosis, unspecified  07/18/2011  . DM (diabetes mellitus) type II controlled with renal manifestation ([place]) 07/18/2011  . Cardiac enzymes elevated 07/18/2011  . Metabolic acidosis 71/02/2693  . HTN (hypertension) 07/18/2011    Expected Discharge Date: Expected Discharge Date: 11/02/15  Team Members Present: Physician leading conference: Dr. Alysia Penna Social Worker Present: Alfonse Alpers, LCSW Nurse Present: Dorien Chihuahua, RN PT Present: Other (comment) Colon Flattery, PT) OT Present: Clyda Greener, OT SLP Present: Windell Moulding, SLP PPS Coordinator present : Daiva Nakayama, RN, CRRN     Current Status/Progress Goal Weekly Team Focus  Medical   upgraded to D3 back on warfarin  home d/c with wife  monitor for recurrent GI bleed on warfarin   Bowel/Bladder   Oliguric. Continent bowel, occasional incontinence of bladder.   Continent bowel and bladder with BM QD or QOD.  Monitor.  Timed toileting,  HD  Tues. Thurs. Sat.   Swallow/Nutrition/ Hydration   Advanced to Dys 3 textures, thin liquids; full supervision  supervision  toleration of recently upgraded diet and trials of upgraded textures, working towards advancing pt to intermittent supervision    ADL's   min assist UB bathing and dressing, min assist for LB bathing and dressing as well.  increased RUE functional use but still with weakness in the right hand when attempting to hold objects or open fingers.  Brunnstrum stage IV movement noted in both with greater digit flexion and extension present.   supervision to min assist level  selfcare -retraining, neuromuscular re-education, balance re-training, transfer training, pt/family education  Mobility   Min-mod A overall  Goals upgraded due to fast progress; mod I transfers, supervision gait and stair negotiation  NMR and attention to R side, gait, balance and stair training   Communication   Min-mod assist   Min-mod assist   verbal expression in sentences and conversations    Safety/Cognition/  Behavioral Observations  improved awareness of verbal errors, given improved communication, recommend cognitive assessment given pt reports of impaired memory   tbd      Pain   Denies  Managed at goal 4/10  Monitor. Assess for non-verbal S/S pain.   Skin   C.D.I..  One suture remains right neck.   No S/S infection, no breakdown, no injury this admission.  Monitor, assist with repositioning.    Rehab Goals Patient on target to meet rehab goals: Yes Rehab Goals Revised: Therapist may be able to upgrade some of his goals to supervision if pt continues to progress. *See Care Plan and progress notes for long and short-term goals.  Barriers to Discharge: multiple medical issues, new onset HD    Possible Resolutions to Barriers:  cont rehab    Discharge Planning/Teaching Needs:  Pt will return to his home with his wife and mother to provide 24/7 supervision until pt can be safely left alone.  Pt's mother is here daily during therapies and wife comes after work.  We will provide family education with mother and wife next week.   Team Discussion:  Medical team decided to restart coumadin and they will watch for GI bleed.  Pt is doing functionally better with OT and is at min to mod assist and is getting good return in his shoulder and arm, but not fingers yet.  OT hopes to upgrade some of his goals to supervision.  ST states pt is conversationally better, but has difficulty still with more complex tasks.  Pt can report feeling "differently" cognitively than he used to be.  His swallowing is better and he's been changed to a D3 diet.  With PT, pt is min assist for gait.  Max assist with stairs.  PT also plans to upgrade gait goals to supervision, but pt will remain min - mod assist for stairs.  Therapists are recommending that family move pt's bed downstairs.  Revisions to Treatment Plan:  Goals will be upgraded as they can so that pt can reach supervision level in appropriate tasks.   Continued  Need for Acute Rehabilitation Level of Care: The patient requires daily medical management by a physician with specialized training in physical medicine and rehabilitation for the following conditions: Daily direction of a multidisciplinary physical rehabilitation program to ensure safe treatment while eliciting the highest outcome that is of practical value to the patient.: Yes Daily medical management of patient stability for increased activity during participation in an intensive rehabilitation regime.: Yes Daily analysis of laboratory values and/or radiology reports with any subsequent need for medication adjustment of medical intervention for : Blood pressure problems;Renal problems;Neurological problems  Kennette Cuthrell, Silvestre Mesi 10/26/2015, 9:36 AM

## 2015-10-27 ENCOUNTER — Inpatient Hospital Stay (HOSPITAL_COMMUNITY): Payer: BLUE CROSS/BLUE SHIELD

## 2015-10-27 ENCOUNTER — Inpatient Hospital Stay (HOSPITAL_COMMUNITY): Payer: BLUE CROSS/BLUE SHIELD | Admitting: *Deleted

## 2015-10-27 LAB — CBC
HCT: 31.2 % — ABNORMAL LOW (ref 39.0–52.0)
Hemoglobin: 10.5 g/dL — ABNORMAL LOW (ref 13.0–17.0)
MCH: 28.3 pg (ref 26.0–34.0)
MCHC: 33.7 g/dL (ref 30.0–36.0)
MCV: 84.1 fL (ref 78.0–100.0)
Platelets: 478 10*3/uL — ABNORMAL HIGH (ref 150–400)
RBC: 3.71 MIL/uL — ABNORMAL LOW (ref 4.22–5.81)
RDW: 17.5 % — ABNORMAL HIGH (ref 11.5–15.5)
WBC: 19.7 10*3/uL — ABNORMAL HIGH (ref 4.0–10.5)

## 2015-10-27 LAB — HEPARIN LEVEL (UNFRACTIONATED): Heparin Unfractionated: 0.45 IU/mL (ref 0.30–0.70)

## 2015-10-27 LAB — PROTIME-INR
INR: 1.28 (ref 0.00–1.49)
Prothrombin Time: 16.1 seconds — ABNORMAL HIGH (ref 11.6–15.2)

## 2015-10-27 LAB — GLUCOSE, CAPILLARY
Glucose-Capillary: 126 mg/dL — ABNORMAL HIGH (ref 65–99)
Glucose-Capillary: 162 mg/dL — ABNORMAL HIGH (ref 65–99)
Glucose-Capillary: 197 mg/dL — ABNORMAL HIGH (ref 65–99)
Glucose-Capillary: 187 mg/dL — ABNORMAL HIGH (ref 65–99)

## 2015-10-27 MED ORDER — SODIUM CHLORIDE 0.9 % IV SOLN: 100.0000 mL | INTRAVENOUS | Status: DC | PRN

## 2015-10-27 MED ORDER — LIDOCAINE-PRILOCAINE 2.5-2.5 % EX CREA
1.0000 "application " | TOPICAL_CREAM | CUTANEOUS | Status: DC | PRN
  Filled 2015-10-27: qty 5

## 2015-10-27 MED ORDER — HEPARIN SODIUM (PORCINE) 1000 UNIT/ML DIALYSIS: 1000.0000 [IU] | INTRAMUSCULAR | Status: DC | PRN

## 2015-10-27 MED ORDER — ALTEPLASE 2 MG IJ SOLR
2.0000 mg | Freq: Once | INTRAMUSCULAR | Status: DC | PRN
  Filled 2015-10-27: qty 2

## 2015-10-27 MED ORDER — LIDOCAINE HCL (PF) 1 % IJ SOLN: 5.0000 mL | INTRAMUSCULAR | Status: DC | PRN

## 2015-10-27 MED ORDER — PENTAFLUOROPROP-TETRAFLUOROETH EX AERO: 1.0000 "application " | INHALATION_SPRAY | CUTANEOUS | Status: DC | PRN

## 2015-10-27 MED ORDER — WARFARIN SODIUM 7.5 MG PO TABS
7.5000 mg | ORAL_TABLET | Freq: Once | ORAL | Status: AC
  Administered 2015-10-27: 7.5 mg via ORAL
  Filled 2015-10-27: qty 1

## 2015-10-27 MED ORDER — HEPARIN SODIUM (PORCINE) 1000 UNIT/ML DIALYSIS: 20.0000 [IU]/kg | INTRAMUSCULAR | Status: DC | PRN

## 2015-10-27 NOTE — Progress Notes (Signed)
ANTICOAGULATION CONSULT NOTE - Follow Up Consult  Pharmacy Consult for Coumadin Indication: pulmonary embolus, DVT and stroke  Allergies  Allergen Reactions  . Codeine Other (See Comments)    Pt unknown  . Codeine Rash and Other (See Comments)    Unknown reaction (patient says it was more serious than just a rash, but he can't remember what happened)    Patient Measurements: Height: 5' 11"  (180.3 cm) Weight: 214 lb 1.1 oz (97.1 kg) IBW/kg (Calculated) : 75.3   Vital Signs: Temp: 98.3 F (36.8 C) (02/18 0441) Temp Source: Oral (02/18 0441) BP: 113/71 mmHg (02/18 0441) Pulse Rate: 95 (02/18 0441)  Labs:  Recent Labs  10/24/15 2230 10/25/15 0558 10/25/15 1615 10/26/15 0502 10/26/15 1404 10/26/15 1957 10/27/15 0517  HGB 10.3* 10.1*  --  10.7*  --   --  10.5*  HCT 29.8* 29.8*  --  32.2*  --   --  31.2*  PLT 431* 453*  --  456*  --   --  478*  LABPROT  --  14.9  --  14.9  --   --  16.1*  INR  --  1.15  --  1.15  --   --  1.28  HEPARINUNFRC  --  0.71* 0.61 0.29* 0.37 0.36 0.45  CREATININE 4.75*  --  5.48*  --   --   --   --     Estimated Creatinine Clearance: 18.9 mL/min (by C-G formula based on Cr of 5.48).   Medications:  Scheduled:  . antiseptic oral rinse  7 mL Mouth Rinse BID  . aspirin EC  81 mg Oral Daily  . carbamide peroxide  5 drop Both Ears BID  . darbepoetin (ARANESP) injection - DIALYSIS  100 mcg Intravenous Q Tue-HD  . fluticasone furoate-vilanterol  1 puff Inhalation Daily  . insulin aspart  0-5 Units Subcutaneous QHS  . insulin aspart  0-9 Units Subcutaneous TID WC  . insulin detemir  20 Units Subcutaneous QHS  . magic mouthwash w/lidocaine  5 mL Oral QID  . midodrine  2.5 mg Oral BID WC  . montelukast  10 mg Oral QHS  . multivitamin  1 tablet Oral QHS  . pantoprazole  40 mg Oral BID  . Warfarin - Pharmacist Dosing Inpatient   Does not apply q1800    Assessment: 50yo on with CVA on heparin bridge to Coumadin for history PE/DVT (on lovenox  PTA).  INR with slight change at 1.28 (coumadin increased 2/17).  Hg and pltc stable.  No bleeding noted. Heparin drip 1600 uts/hr HL 0.45 at goal.    Goal of Therapy:  Heparin level 0.3-0.5 units/ml INR 2-2.5 Monitor platelets by anticoagulation protocol: Yes   Plan:  Coumadin 7.31m po today Continue Heparin drip 1600 uts/hr Daily INR, HL, CBC  AHildred Laser Pharm D 10/27/2015 11:17 AM

## 2015-10-27 NOTE — Progress Notes (Signed)
Occupational Therapy Session Note  Patient Details  Name: Isaiah [name] MRN: [id number] Date of Birth: 1966-07-12  Today's Date: 10/27/2015 OT Individual Time: 0815-0900 OT Individual Time Calculation (min): 45 min    Short Term Goals: Week 2:  OT Short Term Goal 1 (Week 2): Pt will complete toilet transfer with min assist OT Short Term Goal 1 - Progress (Week 2): Met OT Short Term Goal 2 (Week 2): Pt will complete LB dressing with mod assist OT Short Term Goal 2 - Progress (Week 2): Met OT Short Term Goal 3 (Week 2): Pt will complete UB dressing with min assist OT Short Term Goal 3 - Progress (Week 2): Met OT Short Term Goal 4 (Week 2): Pt will engage in functional activity in standing for 1 minute with min asisst OT Short Term Goal 4 - Progress (Week 2): Met OT Short Term Goal 5 (Week 2): Pt will use the LUE as an active assist for bathing tasks with mod assist.   OT Short Term Goal 5 - Progress (Week 2): Met  Skilled Therapeutic Interventions/Progress Updates:    OT session focused on sit<>stand, standing balance, hemi dressing technique, and functional use of RUE. Pt received supine in bed. Transferred to w/c with min A stand pivot transfer. Completed bathing sit<>stand at sink with pt requiring CGA assist for standing balance and min cues for positioning. Pt initiated functional use of RUE throughout bathing to turn water on, squeeze wash cloth, etc with OT providing hand over hand assist to increase success and allow for thoroughness. Pt recalled hemi-dressing techniques with no cues, donning shirt with setup assist and extended time. Pt requested to toilet therefore ambulated to bathroom and back to bed with min A and use of RW. Pt completed toilet task in standing with min A for balance and assist to manage clothing over right hip. Pt left supine in bed with all needs in reach.  Therapy Documentation Precautions:  Precautions Precautions: Fall Precaution Comments: expressive/  receptive difficulties Restrictions Weight Bearing Restrictions: No General:   Vital Signs:  Pain: No report of pain  See Function Navigator for Current Functional Status.   Therapy/Group: Individual Therapy  Duayne Cal 10/27/2015, 8:46 AM

## 2015-10-27 NOTE — Progress Notes (Signed)
Subjective/Complaints: Had a good night. No complaints. Denies pain.   ROS- appears to deny CP, SOB, N/V/D  Objective: Vital Signs: Blood pressure 113/71, pulse 95, temperature 98.3 F (36.8 C), temperature source Oral, resp. rate 18, height 5' 11"  (1.803 m), weight 97.1 kg (214 lb 1.1 oz), SpO2 96 %. No results found. Results for orders placed or performed during the hospital encounter of 10/12/15 (from the past 72 hour(s))  Glucose, capillary     Status: Abnormal   Collection Time: 10/24/15 11:52 AM  Result Value Ref Range   Glucose-Capillary 224 (H) 65 - 99 mg/dL   Comment 1 Notify RN   Heparin level (unfractionated)     Status: Abnormal   Collection Time: 10/24/15 12:10 PM  Result Value Ref Range   Heparin Unfractionated 1.24 (H) 0.30 - 0.70 IU/mL    Comment: RESULTS CONFIRMED BY MANUAL DILUTION        IF HEPARIN RESULTS ARE BELOW EXPECTED VALUES, AND PATIENT DOSAGE HAS BEEN CONFIRMED, SUGGEST FOLLOW UP TESTING OF ANTITHROMBIN III LEVELS.   Lipid panel     Status: None   Collection Time: 10/24/15 12:10 PM  Result Value Ref Range   Cholesterol 128 0 - 200 mg/dL   Triglycerides 88 <150 mg/dL   HDL 48 >40 mg/dL   Total CHOL/HDL Ratio 2.7 RATIO   VLDL 18 0 - 40 mg/dL   LDL Cholesterol 62 0 - 99 mg/dL    Comment:        Total Cholesterol/HDL:CHD Risk Coronary Heart Disease Risk Table                     Men   Women  1/2 Average Risk   3.4   3.3  Average Risk       5.0   4.4  2 X Average Risk   9.6   7.1  3 X Average Risk  23.4   11.0        Use the calculated Patient Ratio above and the CHD Risk Table to determine the patient's CHD Risk.        ATP III CLASSIFICATION (LDL):  <100     mg/dL   Optimal  100-129  mg/dL   Near or Above                    Optimal  130-159  mg/dL   Borderline  160-189  mg/dL   High  >190     mg/dL   Very High   Hemoglobin A1c     Status: Abnormal   Collection Time: 10/24/15 12:10 PM  Result Value Ref Range   Hgb A1c MFr Bld 6.6  (H) 4.8 - 5.6 %    Comment: (NOTE)         Pre-diabetes: 5.7 - 6.4         Diabetes: >6.4         Glycemic control for adults with diabetes: <7.0    Mean Plasma Glucose 143 mg/dL    Comment: (NOTE) Performed At: [facility] [place], [place] [id number] Lindon Romp MD YH:[id number]   TSH     Status: None   Collection Time: 10/24/15 12:10 PM  Result Value Ref Range   TSH 2.018 0.350 - 4.500 uIU/mL  Vitamin B12     Status: None   Collection Time: 10/24/15 12:10 PM  Result Value Ref Range   Vitamin B-12 801 180 - 914 pg/mL    Comment: (  NOTE) This assay is not validated for testing neonatal or myeloproliferative syndrome specimens for Vitamin B12 levels.   Folate     Status: None   Collection Time: 10/24/15 12:10 PM  Result Value Ref Range   Folate 16.9 >5.9 ng/mL  Protime-INR     Status: None   Collection Time: 10/24/15 12:10 PM  Result Value Ref Range   Prothrombin Time 15.1 11.6 - 15.2 seconds   INR 1.17 0.00 - 1.49  Heparin level (unfractionated)     Status: None   Collection Time: 10/24/15  3:39 PM  Result Value Ref Range   Heparin Unfractionated 0.43 0.30 - 0.70 IU/mL    Comment:        IF HEPARIN RESULTS ARE BELOW EXPECTED VALUES, AND PATIENT DOSAGE HAS BEEN CONFIRMED, SUGGEST FOLLOW UP TESTING OF ANTITHROMBIN III LEVELS.   Glucose, capillary     Status: Abnormal   Collection Time: 10/24/15  4:51 PM  Result Value Ref Range   Glucose-Capillary 132 (H) 65 - 99 mg/dL   Comment 1 Notify RN   Glucose, capillary     Status: Abnormal   Collection Time: 10/24/15  9:00 PM  Result Value Ref Range   Glucose-Capillary 187 (H) 65 - 99 mg/dL   Comment 1 Notify RN   Renal function panel     Status: Abnormal   Collection Time: 10/24/15 10:30 PM  Result Value Ref Range   Sodium 135 135 - 145 mmol/L   Potassium 4.0 3.5 - 5.1 mmol/L   Chloride 99 (L) 101 - 111 mmol/L   CO2 23 22 - 32 mmol/L   Glucose, Bld 201 (H) 65 - 99 mg/dL   BUN 16 6 - 20  mg/dL   Creatinine, Ser 4.75 (H) 0.61 - 1.24 mg/dL   Calcium 9.0 8.9 - 10.3 mg/dL   Phosphorus 3.9 2.5 - 4.6 mg/dL   Albumin 2.6 (L) 3.5 - 5.0 g/dL   GFR calc non Af Amer 13 (L) >60 mL/min   GFR calc Af Amer 15 (L) >60 mL/min    Comment: (NOTE) The eGFR has been calculated using the CKD EPI equation. This calculation has not been validated in all clinical situations. eGFR's persistently <60 mL/min signify possible Chronic Kidney Disease.    Anion gap 13 5 - 15  CBC     Status: Abnormal   Collection Time: 10/24/15 10:30 PM  Result Value Ref Range   WBC 17.9 (H) 4.0 - 10.5 K/uL   RBC 3.52 (L) 4.22 - 5.81 MIL/uL   Hemoglobin 10.3 (L) 13.0 - 17.0 g/dL   HCT 29.8 (L) 39.0 - 52.0 %   MCV 84.7 78.0 - 100.0 fL   MCH 29.3 26.0 - 34.0 pg   MCHC 34.6 30.0 - 36.0 g/dL   RDW 17.6 (H) 11.5 - 15.5 %   Platelets 431 (H) 150 - 400 K/uL  Heparin level (unfractionated)     Status: Abnormal   Collection Time: 10/25/15  5:58 AM  Result Value Ref Range   Heparin Unfractionated 0.71 (H) 0.30 - 0.70 IU/mL    Comment:        IF HEPARIN RESULTS ARE BELOW EXPECTED VALUES, AND PATIENT DOSAGE HAS BEEN CONFIRMED, SUGGEST FOLLOW UP TESTING OF ANTITHROMBIN III LEVELS.   CBC     Status: Abnormal   Collection Time: 10/25/15  5:58 AM  Result Value Ref Range   WBC 16.6 (H) 4.0 - 10.5 K/uL   RBC 3.53 (L) 4.22 - 5.81 MIL/uL   Hemoglobin  10.1 (L) 13.0 - 17.0 g/dL   HCT 29.8 (L) 39.0 - 52.0 %   MCV 84.4 78.0 - 100.0 fL   MCH 28.6 26.0 - 34.0 pg   MCHC 33.9 30.0 - 36.0 g/dL   RDW 17.6 (H) 11.5 - 15.5 %   Platelets 453 (H) 150 - 400 K/uL  Protime-INR     Status: None   Collection Time: 10/25/15  5:58 AM  Result Value Ref Range   Prothrombin Time 14.9 11.6 - 15.2 seconds   INR 1.15 0.00 - 1.49  Glucose, capillary     Status: Abnormal   Collection Time: 10/25/15  6:41 AM  Result Value Ref Range   Glucose-Capillary 138 (H) 65 - 99 mg/dL   Comment 1 Notify RN   Glucose, capillary     Status: Abnormal    Collection Time: 10/25/15 11:16 AM  Result Value Ref Range   Glucose-Capillary 280 (H) 65 - 99 mg/dL  Heparin level (unfractionated)     Status: None   Collection Time: 10/25/15  4:15 PM  Result Value Ref Range   Heparin Unfractionated 0.61 0.30 - 0.70 IU/mL    Comment:        IF HEPARIN RESULTS ARE BELOW EXPECTED VALUES, AND PATIENT DOSAGE HAS BEEN CONFIRMED, SUGGEST FOLLOW UP TESTING OF ANTITHROMBIN III LEVELS.   Renal function panel     Status: Abnormal   Collection Time: 10/25/15  4:15 PM  Result Value Ref Range   Sodium 137 135 - 145 mmol/L   Potassium 3.6 3.5 - 5.1 mmol/L   Chloride 102 101 - 111 mmol/L   CO2 24 22 - 32 mmol/L   Glucose, Bld 140 (H) 65 - 99 mg/dL   BUN 20 6 - 20 mg/dL   Creatinine, Ser 5.48 (H) 0.61 - 1.24 mg/dL   Calcium 9.0 8.9 - 10.3 mg/dL   Phosphorus 3.3 2.5 - 4.6 mg/dL   Albumin 2.7 (L) 3.5 - 5.0 g/dL   GFR calc non Af Amer 11 (L) >60 mL/min   GFR calc Af Amer 13 (L) >60 mL/min    Comment: (NOTE) The eGFR has been calculated using the CKD EPI equation. This calculation has not been validated in all clinical situations. eGFR's persistently <60 mL/min signify possible Chronic Kidney Disease.    Anion gap 11 5 - 15  Glucose, capillary     Status: None   Collection Time: 10/25/15  9:00 PM  Result Value Ref Range   Glucose-Capillary 83 65 - 99 mg/dL  Heparin level (unfractionated)     Status: Abnormal   Collection Time: 10/26/15  5:02 AM  Result Value Ref Range   Heparin Unfractionated 0.29 (L) 0.30 - 0.70 IU/mL    Comment:        IF HEPARIN RESULTS ARE BELOW EXPECTED VALUES, AND PATIENT DOSAGE HAS BEEN CONFIRMED, SUGGEST FOLLOW UP TESTING OF ANTITHROMBIN III LEVELS.   CBC     Status: Abnormal   Collection Time: 10/26/15  5:02 AM  Result Value Ref Range   WBC 20.5 (H) 4.0 - 10.5 K/uL   RBC 3.78 (L) 4.22 - 5.81 MIL/uL   Hemoglobin 10.7 (L) 13.0 - 17.0 g/dL   HCT 32.2 (L) 39.0 - 52.0 %   MCV 85.2 78.0 - 100.0 fL   MCH 28.3 26.0 - 34.0  pg   MCHC 33.2 30.0 - 36.0 g/dL   RDW 17.3 (H) 11.5 - 15.5 %   Platelets 456 (H) 150 - 400 K/uL  Protime-INR  Status: None   Collection Time: 10/26/15  5:02 AM  Result Value Ref Range   Prothrombin Time 14.9 11.6 - 15.2 seconds   INR 1.15 0.00 - 1.49  Glucose, capillary     Status: Abnormal   Collection Time: 10/26/15  6:42 AM  Result Value Ref Range   Glucose-Capillary 115 (H) 65 - 99 mg/dL  Glucose, capillary     Status: Abnormal   Collection Time: 10/26/15 11:47 AM  Result Value Ref Range   Glucose-Capillary 279 (H) 65 - 99 mg/dL  Heparin level (unfractionated)     Status: None   Collection Time: 10/26/15  2:04 PM  Result Value Ref Range   Heparin Unfractionated 0.37 0.30 - 0.70 IU/mL    Comment:        IF HEPARIN RESULTS ARE BELOW EXPECTED VALUES, AND PATIENT DOSAGE HAS BEEN CONFIRMED, SUGGEST FOLLOW UP TESTING OF ANTITHROMBIN III LEVELS.   Glucose, capillary     Status: Abnormal   Collection Time: 10/26/15  4:41 PM  Result Value Ref Range   Glucose-Capillary 107 (H) 65 - 99 mg/dL  Heparin level (unfractionated)     Status: None   Collection Time: 10/26/15  7:57 PM  Result Value Ref Range   Heparin Unfractionated 0.36 0.30 - 0.70 IU/mL    Comment:        IF HEPARIN RESULTS ARE BELOW EXPECTED VALUES, AND PATIENT DOSAGE HAS BEEN CONFIRMED, SUGGEST FOLLOW UP TESTING OF ANTITHROMBIN III LEVELS.   Glucose, capillary     Status: Abnormal   Collection Time: 10/26/15  8:41 PM  Result Value Ref Range   Glucose-Capillary 197 (H) 65 - 99 mg/dL   Comment 1 Notify RN   Heparin level (unfractionated)     Status: None   Collection Time: 10/27/15  5:17 AM  Result Value Ref Range   Heparin Unfractionated 0.45 0.30 - 0.70 IU/mL    Comment:        IF HEPARIN RESULTS ARE BELOW EXPECTED VALUES, AND PATIENT DOSAGE HAS BEEN CONFIRMED, SUGGEST FOLLOW UP TESTING OF ANTITHROMBIN III LEVELS.   CBC     Status: Abnormal   Collection Time: 10/27/15  5:17 AM  Result Value Ref  Range   WBC 19.7 (H) 4.0 - 10.5 K/uL   RBC 3.71 (L) 4.22 - 5.81 MIL/uL   Hemoglobin 10.5 (L) 13.0 - 17.0 g/dL   HCT 31.2 (L) 39.0 - 52.0 %   MCV 84.1 78.0 - 100.0 fL   MCH 28.3 26.0 - 34.0 pg   MCHC 33.7 30.0 - 36.0 g/dL   RDW 17.5 (H) 11.5 - 15.5 %   Platelets 478 (H) 150 - 400 K/uL  Protime-INR     Status: Abnormal   Collection Time: 10/27/15  5:17 AM  Result Value Ref Range   Prothrombin Time 16.1 (H) 11.6 - 15.2 seconds   INR 1.28 0.00 - 1.49  Glucose, capillary     Status: Abnormal   Collection Time: 10/27/15  6:35 AM  Result Value Ref Range   Glucose-Capillary 126 (H) 65 - 99 mg/dL   Comment 1 Notify RN     General: No acute distress. Vital signs reviewed Psych: Mood and affect are appropriate Heart: Regular rate and rhythm  Lungs: Clear to auscultation, breathing unlabored, no rales or wheezes Abdomen: Positive bowel sounds, soft nontender to palpation, nondistended Skin: No evidence of breakdown, no evidence of rash Neurologic:  Motor  RUE/RLE: 5/5 proximal distal LUE: 4 -/5 proximal distal LLE: hip flexion, knee extension  4/5, 3-/5 ankle dorsi/plantar flexion MAS: Right wrist flexion 1+/5, elbow flexion 1/5 Musculoskeletal: No TTP. No joint swelling. No edema  Assessment/Plan: 1. 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 Physiatrist is providing close team supervision and 24 hour management of active medical problems listed below. Physiatrist and rehab team continue to assess barriers to discharge/monitor patient progress toward functional and medical goals. FIM: Function - Bathing Position: Wheelchair/chair at sink Body parts bathed by patient: Right arm, Chest, Abdomen, Front perineal area, Buttocks, Right upper leg, Left upper leg, Right lower leg, Left lower leg Body parts bathed by helper:  Back, Left arm Bathing not applicable: Front perineal area, Buttocks, Right upper leg, Left upper leg, Right lower leg, Left lower leg Assist Level:  (min A)  Function- Upper Body Dressing/Undressing What is the patient wearing?: Pull over shirt/dress Pull over shirt/dress - Perfomed by patient: Put head through opening, Pull shirt over trunk, Thread/unthread right sleeve, Thread/unthread left sleeve Pull over shirt/dress - Perfomed by helper: Thread/unthread left sleeve Assist Level:  (mod A) Function - Lower Body Dressing/Undressing What is the patient wearing?: Shoes, Pants Position: Wheelchair/chair at sink Pants- Performed by patient: Thread/unthread right pants leg, Thread/unthread left pants leg, Pull pants up/down Pants- Performed by helper: Pull pants up/down Non-skid slipper socks- Performed by helper: Don/doff right sock, Don/doff left sock Socks - Performed by patient: Don/doff right sock, Don/doff left sock Shoes - Performed by patient: Don/doff right shoe, Don/doff left shoe Shoes - Performed by helper: Don/doff right shoe, Don/doff left shoe Assist for footwear: Dependant Assist for lower body dressing: Touching or steadying assistance (Pt > 75%)  Function - Toileting Toileting steps completed by patient: Adjust clothing prior to toileting, Performs perineal hygiene, Adjust clothing after toileting Toileting steps completed by helper: Adjust clothing after toileting Toileting Assistive Devices: Grab bar or rail Assist level: Touching or steadying assistance (Pt.75%)  Function - Air cabin crew transfer assistive device: Grab bar Assist level to toilet: Touching or steadying assistance (Pt > 75%) Assist level from toilet: Touching or steadying assistance (Pt > 75%)  Function - Chair/bed transfer Chair/bed transfer method: Stand pivot Chair/bed transfer assist level: Supervision or verbal cues Chair/bed transfer assistive device: Walker, Armrests Chair/bed  transfer details: Verbal cues for safe use of DME/AE, Verbal cues for technique  Function - Locomotion: Wheelchair Will patient use wheelchair at discharge?: No Type: Manual Max wheelchair distance: 150 Assist Level: Supervision or verbal cues Wheel 50 feet with 2 turns activity did not occur: Safety/medical concerns Assist Level: Supervision or verbal cues Wheel 150 feet activity did not occur: Safety/medical concerns Assist Level: Supervision or verbal cues Turns around,maneuvers to table,bed, and toilet,negotiates 3% grade,maneuvers on rugs and over doorsills: No Function - Locomotion: Ambulation Ambulation activity did not occur: Safety/medical concerns Assistive device: Walker-rolling Max distance: 165 Assist level: Supervision or verbal cues Assist level: Supervision or verbal cues Assist level: Supervision or verbal cues Walk 150 feet activity did not occur: Safety/medical concerns Assist level: Supervision or verbal cues Walk 10 feet on uneven surfaces activity did not occur: Safety/medical concerns  Function - Comprehension Comprehension: Auditory Comprehension assist level: Understands complex 90% of the time/cues 10% of the time  Function - Expression Expression: Verbal Expression assist level: Expresses basic 50 - 74% of the time/requires cueing 25 - 49% of the time. Needs to repeat parts of sentences.  Function -  Social Interaction Social Interaction assist level: Interacts appropriately 90% of the time - Needs monitoring or encouragement for participation or interaction.  Function - Problem Solving Problem solving assist level: Solves basic 75 - 89% of the time/requires cueing 10 - 24% of the time  Function - Memory Memory assist level: Recognizes or recalls 75 - 89% of the time/requires cueing 10 - 24% of the time Patient normally able to recall (first 3 days only): Staff names and faces, That he or she is in a hospital  Medical Problem List and Plan: 1.  Right hemiparesis and aphasia secondary to bi-cerebral embolic infarcts (M>I)-6.OEHOZYY ultrasounds demonstrated 1-39% stenosis bilateral ICA no other abnormalities noted PE/DVT/Stoke/Anticoagulation: Pharmaceutical: Lovenox bid per records from [place].  use iv heparin gtt at this time. per hematologist Dr Alen Blew and neuro resume warfarin-goal INR 2.0-2.5, currently INR 1.28 3. Pain Management: Ultram prn.  4. Mood: LCSW to follow for evaluation and support.  5. Neuropsych: This patient is not fully capable of making decisions on his own behalf. 6. Skin/Wound Care: Routine pressure relief measures.  7. Fluids/Electrolytes/Nutrition: Renal restrictions. 1200 cc/FR.  8. DM type 2: Monitor BS ac/hs. Continue levemir and use SSI for elevated BS.   Fasting CBG 126  continue current dose Levemir   9. HTN: Monitor BP bid and titrate medications as needed. Elevated at admission. Parameters on Midodrine.  10. ESRD on HD: per [facility]. Continue diet/volume mgt, Tunnel dialysis catheter Placed.  11. Leukocytosis: no breathing difficulties or coughing. UA negative except for elevated protein as expected from renal failure    12.  Hx of GI bleed will monitor for reurrence,Hgb stable at 10.5 today  -on daily CBC given warfarin and hep  13. Dysphagia- Per speech therapy, on D2 solids, thin liq,  Upgraded to D3  LOS (Days) 15 A FACE TO FACE EVALUATION WAS PERFORMED  SWARTZ,ZACHARY T 10/27/2015, 8:24 AM

## 2015-10-27 NOTE — Progress Notes (Addendum)
Physical Therapy Session Note  Patient Details  Name: Isaiah [name] MRN: [id number] Date of Birth: [birth year]  Today's Date: 10/27/2015 PT Individual Time: 1500-1533 PT Individual Time Calculation (min): 33 min   Short Term Goals: Week 2:  PT Short Term Goal 1 (Week 2): Pt will perform bed mobility from flat bed consistently with supervision PT Short Term Goal 2 (Week 2): Pt will perform bed <> chair transfers with consistent supervision PT Short Term Goal 3 (Week 2): Pt will perform stair negotiation up/down 8 stairs with two rails and min A PT Short Term Goal 4 (Week 2): Pt will perform gait with LRAD in controlled environment x 150' with consistent min A PT Short Term Goal 5 (Week 2): Pt will perform car transfer with supervision Week 3:     Skilled Therapeutic Interventions/Progress Updates:  Tx focused on functional mobility training, gait with RW and SPC, stair training, and NMR via forced use, manual facilitation, and multi-modal cues. Pt continued to comment on "cloudy" vision and looking forward to vestibular eval.   Sit<>stand transfers performed throughout with close S and cues for forced use.   Gait in controlled setting 1x165' with RW and 1x50' and x165' with [facility] following demonstration and instructions for gait pattern. Pt needed Min A overall with gait, but notable for decreased R foot clearance, decreased heel strike, and forward flexed posture. Pt with decreased righting reactions and will benefit from multi-directional perturbations for postural control, as well as direction changes and varied terrains. Pt improved well with manual facilitation for posture as well as simple cues ("heel") for R step. Pt will likely benefit from progressing gait with SPC to eventually holding towel/object in crook of elbow for UE activation.   Stair training x8 with R rail only, sideways with Min A overall and safety cues for R foot placement.   Pt left up in room with family present, all  needs in reach.       Therapy Documentation Precautions:  Precautions Precautions: Fall Precaution Comments: expressive/ receptive difficulties Restrictions Weight Bearing Restrictions: No General:   Vital Signs: Therapy Vitals Temp: 97.8 F (36.6 C) Temp Source: Oral Pulse Rate: 88 Resp: 18 BP: 118/78 mmHg Patient Position (if appropriate): Lying Oxygen Therapy SpO2: 98 % O2 Device: Not Delivered Pain: none   See Function Navigator for Current Functional Status.   Therapy/Group: Individual Therapy  Katherine Syme Soundra Pilon, PT, DPT  10/27/2015, 3:26 PM

## 2015-10-27 NOTE — Progress Notes (Signed)
Assessment:  1. New ESRD  2. Embolic CVA/heparin infusion 3. Anemia on aranesp 4. Sec HPTH, PTH 211 5. Hypotension improved, off midodrine 6. Hx MGUS  Plan: 1. HD today 2. Plan to go to [name] after DC. TTS 2nd shift. 3. AVF when cleared by neuro  Subjective: Interval History: Rehab  Objective: Vital signs in last 24 hours: Temp:  [97.8 F (36.6 C)-98.3 F (36.8 C)] 97.8 F (36.6 C) (02/18 1316) Pulse Rate:  [88-95] 88 (02/18 1316) Resp:  [18] 18 (02/18 1316) BP: (113-118)/(71-78) 118/78 mmHg (02/18 1316) SpO2:  [95 %-98 %] 98 % (02/18 1316) Weight change:   Intake/Output from previous day: 02/17 0701 - 02/18 0700 In: 480 [P.O.:480] Out: -  Intake/Output this shift: Total I/O In: 480 [P.O.:480] Out: -   General appearance: alert and cooperative Chest wall: no tenderness Cardio: reg and tachy, just moved about with transfer Extremities: extremities normal, atraumatic, no cyanosis or edema right hemip  Lab Results:  Recent Labs  10/26/15 0502 10/27/15 0517  WBC 20.5* 19.7*  HGB 10.7* 10.5*  HCT 32.2* 31.2*  PLT 456* 478*   BMET:  Recent Labs  10/24/15 2230 10/25/15 1615  NA 135 137  K 4.0 3.6  CL 99* 102  CO2 23 24  GLUCOSE 201* 140*  BUN 16 20  CREATININE 4.75* 5.48*  CALCIUM 9.0 9.0   No results for input(s): PTH in the last 72 hours. Iron Studies: No results for input(s): IRON, TIBC, TRANSFERRIN, FERRITIN in the last 72 hours. Studies/Results: No results found.  Scheduled: . antiseptic oral rinse  7 mL Mouth Rinse BID  . aspirin EC  81 mg Oral Daily  . carbamide peroxide  5 drop Both Ears BID  . darbepoetin (ARANESP) injection - DIALYSIS  100 mcg Intravenous Q Tue-HD  . fluticasone furoate-vilanterol  1 puff Inhalation Daily  . insulin aspart  0-5 Units Subcutaneous QHS  . insulin aspart  0-9 Units Subcutaneous TID WC  . insulin detemir  20 Units Subcutaneous QHS  . magic mouthwash w/lidocaine  5 mL Oral QID  . midodrine  2.5 mg  Oral BID WC  . montelukast  10 mg Oral QHS  . multivitamin  1 tablet Oral QHS  . pantoprazole  40 mg Oral BID  . warfarin  7.5 mg Oral ONCE-1800  . Warfarin - Pharmacist Dosing Inpatient   Does not apply q1800     LOS: 15 days   Cande Mastropietro C 10/27/2015,3:46 PM

## 2015-10-28 ENCOUNTER — Inpatient Hospital Stay (HOSPITAL_COMMUNITY): Payer: BLUE CROSS/BLUE SHIELD | Admitting: Occupational Therapy

## 2015-10-28 LAB — PROTIME-INR
INR: 1.38 (ref 0.00–1.49)
Prothrombin Time: 17.1 seconds — ABNORMAL HIGH (ref 11.6–15.2)

## 2015-10-28 LAB — GLUCOSE, CAPILLARY
Glucose-Capillary: 147 mg/dL — ABNORMAL HIGH (ref 65–99)
Glucose-Capillary: 179 mg/dL — ABNORMAL HIGH (ref 65–99)
Glucose-Capillary: 130 mg/dL — ABNORMAL HIGH (ref 65–99)
Glucose-Capillary: 235 mg/dL — ABNORMAL HIGH (ref 65–99)

## 2015-10-28 LAB — HEPARIN LEVEL (UNFRACTIONATED)
Heparin Unfractionated: <0.1 IU/mL — ABNORMAL LOW (ref 0.30–0.70)
Heparin Unfractionated: <0.1 IU/mL — ABNORMAL LOW (ref 0.30–0.70)

## 2015-10-28 LAB — CBC
HCT: 34.2 % — ABNORMAL LOW (ref 39.0–52.0)
Hemoglobin: 11.5 g/dL — ABNORMAL LOW (ref 13.0–17.0)
MCH: 28.5 pg (ref 26.0–34.0)
MCHC: 33.6 g/dL (ref 30.0–36.0)
MCV: 84.9 fL (ref 78.0–100.0)
Platelets: 488 10*3/uL — ABNORMAL HIGH (ref 150–400)
RBC: 4.03 MIL/uL — ABNORMAL LOW (ref 4.22–5.81)
RDW: 17.6 % — ABNORMAL HIGH (ref 11.5–15.5)
WBC: 19.7 10*3/uL — ABNORMAL HIGH (ref 4.0–10.5)

## 2015-10-28 MED ORDER — WARFARIN SODIUM 7.5 MG PO TABS
7.5000 mg | ORAL_TABLET | Freq: Once | ORAL | Status: AC
Start: 2015-10-28 — End: 2015-10-28
  Administered 2015-10-28: 7.5 mg via ORAL
  Filled 2015-10-28: qty 1

## 2015-10-28 NOTE — Progress Notes (Signed)
Subjective/Complaints: No new issues. Working with therapy. Denies pain. Sleep is good. No anxiety/depression  ROS- appears to deny CP, SOB, N/V/D  Objective: Vital Signs: Blood pressure 97/60, pulse 105, temperature 98.7 F (37.1 C), temperature source Oral, resp. rate 17, height _0  (1.803 m), weight 90 kg (198 lb 6.6 oz), SpO2 96 %. No results found. Results for orders placed or performed during the hospital encounter of 10/12/15 (from the past 72 hour(s))  Glucose, capillary     Status: Abnormal   Collection Time: 10/25/15 11:16 AM  Result Value Ref Range   Glucose-Capillary 280 (H) 65 - 99 mg/dL  Heparin level (unfractionated)     Status: None   Collection Time: 10/25/15  4:15 PM  Result Value Ref Range   Heparin Unfractionated 0.61 0.30 - 0.70 IU/mL    Comment:        IF HEPARIN RESULTS ARE BELOW EXPECTED VALUES, AND PATIENT DOSAGE HAS BEEN CONFIRMED, SUGGEST FOLLOW UP TESTING OF ANTITHROMBIN III LEVELS.   Renal function panel     Status: Abnormal   Collection Time: 10/25/15  4:15 PM  Result Value Ref Range   Sodium 137 135 - 145 mmol/L   Potassium 3.6 3.5 - 5.1 mmol/L   Chloride 102 101 - 111 mmol/L   CO2 24 22 - 32 mmol/L   Glucose, Bld 140 (H) 65 - 99 mg/dL   BUN 20 6 - 20 mg/dL   Creatinine, Ser 5.48 (H) 0.61 - 1.24 mg/dL   Calcium 9.0 8.9 - 10.3 mg/dL   Phosphorus 3.3 2.5 - 4.6 mg/dL   Albumin 2.7 (L) 3.5 - 5.0 g/dL   GFR calc non Af Amer 11 (L) >60 mL/min   GFR calc Af Amer 13 (L) >60 mL/min    Comment: (NOTE) The eGFR has been calculated using the CKD EPI equation. This calculation has not been validated in all clinical situations. eGFR's persistently <60 mL/min signify possible Chronic Kidney Disease.    Anion gap 11 5 - 15  Glucose, capillary     Status: None   Collection Time: 10/25/15  9:00 PM  Result Value Ref Range   Glucose-Capillary 83 65 - 99 mg/dL  Heparin level (unfractionated)     Status: Abnormal   Collection Time: 10/26/15  5:02 AM   Result Value Ref Range   Heparin Unfractionated 0.29 (L) 0.30 - 0.70 IU/mL    Comment:        IF HEPARIN RESULTS ARE BELOW EXPECTED VALUES, AND PATIENT DOSAGE HAS BEEN CONFIRMED, SUGGEST FOLLOW UP TESTING OF ANTITHROMBIN III LEVELS.   CBC     Status: Abnormal   Collection Time: 10/26/15  5:02 AM  Result Value Ref Range   WBC 20.5 (H) 4.0 - 10.5 K/uL   RBC 3.78 (L) 4.22 - 5.81 MIL/uL   Hemoglobin 10.7 (L) 13.0 - 17.0 g/dL   HCT 32.2 (L) 39.0 - 52.0 %   MCV 85.2 78.0 - 100.0 fL   MCH 28.3 26.0 - 34.0 pg   MCHC 33.2 30.0 - 36.0 g/dL   RDW 17.3 (H) 11.5 - 15.5 %   Platelets 456 (H) 150 - 400 K/uL  Protime-INR     Status: None   Collection Time: 10/26/15  5:02 AM  Result Value Ref Range   Prothrombin Time 14.9 11.6 - 15.2 seconds   INR 1.15 0.00 - 1.49  Glucose, capillary     Status: Abnormal   Collection Time: 10/26/15  6:42 AM  Result Value Ref Range  Glucose-Capillary 115 (H) 65 - 99 mg/dL  Glucose, capillary     Status: Abnormal   Collection Time: 10/26/15 11:47 AM  Result Value Ref Range   Glucose-Capillary 279 (H) 65 - 99 mg/dL  Heparin level (unfractionated)     Status: None   Collection Time: 10/26/15  2:04 PM  Result Value Ref Range   Heparin Unfractionated 0.37 0.30 - 0.70 IU/mL    Comment:        IF HEPARIN RESULTS ARE BELOW EXPECTED VALUES, AND PATIENT DOSAGE HAS BEEN CONFIRMED, SUGGEST FOLLOW UP TESTING OF ANTITHROMBIN III LEVELS.   Glucose, capillary     Status: Abnormal   Collection Time: 10/26/15  4:41 PM  Result Value Ref Range   Glucose-Capillary 107 (H) 65 - 99 mg/dL  Heparin level (unfractionated)     Status: None   Collection Time: 10/26/15  7:57 PM  Result Value Ref Range   Heparin Unfractionated 0.36 0.30 - 0.70 IU/mL    Comment:        IF HEPARIN RESULTS ARE BELOW EXPECTED VALUES, AND PATIENT DOSAGE HAS BEEN CONFIRMED, SUGGEST FOLLOW UP TESTING OF ANTITHROMBIN III LEVELS.   Glucose, capillary     Status: Abnormal   Collection Time:  10/26/15  8:41 PM  Result Value Ref Range   Glucose-Capillary 197 (H) 65 - 99 mg/dL   Comment 1 Notify RN   Heparin level (unfractionated)     Status: None   Collection Time: 10/27/15  5:17 AM  Result Value Ref Range   Heparin Unfractionated 0.45 0.30 - 0.70 IU/mL    Comment:        IF HEPARIN RESULTS ARE BELOW EXPECTED VALUES, AND PATIENT DOSAGE HAS BEEN CONFIRMED, SUGGEST FOLLOW UP TESTING OF ANTITHROMBIN III LEVELS.   CBC     Status: Abnormal   Collection Time: 10/27/15  5:17 AM  Result Value Ref Range   WBC 19.7 (H) 4.0 - 10.5 K/uL   RBC 3.71 (L) 4.22 - 5.81 MIL/uL   Hemoglobin 10.5 (L) 13.0 - 17.0 g/dL   HCT 31.2 (L) 39.0 - 52.0 %   MCV 84.1 78.0 - 100.0 fL   MCH 28.3 26.0 - 34.0 pg   MCHC 33.7 30.0 - 36.0 g/dL   RDW 17.5 (H) 11.5 - 15.5 %   Platelets 478 (H) 150 - 400 K/uL  Protime-INR     Status: Abnormal   Collection Time: 10/27/15  5:17 AM  Result Value Ref Range   Prothrombin Time 16.1 (H) 11.6 - 15.2 seconds   INR 1.28 0.00 - 1.49  Glucose, capillary     Status: Abnormal   Collection Time: 10/27/15  6:35 AM  Result Value Ref Range   Glucose-Capillary 126 (H) 65 - 99 mg/dL   Comment 1 Notify RN   Glucose, capillary     Status: Abnormal   Collection Time: 10/27/15 11:27 AM  Result Value Ref Range   Glucose-Capillary 162 (H) 65 - 99 mg/dL  Glucose, capillary     Status: Abnormal   Collection Time: 10/27/15  4:26 PM  Result Value Ref Range   Glucose-Capillary 197 (H) 65 - 99 mg/dL  Glucose, capillary     Status: Abnormal   Collection Time: 10/27/15  8:39 PM  Result Value Ref Range   Glucose-Capillary 187 (H) 65 - 99 mg/dL   Comment 1 Notify RN   Heparin level (unfractionated)     Status: Abnormal   Collection Time: 10/28/15  5:49 AM  Result Value Ref Range  Heparin Unfractionated <0.10 (L) 0.30 - 0.70 IU/mL    Comment:        IF HEPARIN RESULTS ARE BELOW EXPECTED VALUES, AND PATIENT DOSAGE HAS BEEN CONFIRMED, SUGGEST FOLLOW UP TESTING OF ANTITHROMBIN  III LEVELS.   CBC     Status: Abnormal   Collection Time: 10/28/15  5:49 AM  Result Value Ref Range   WBC 19.7 (H) 4.0 - 10.5 K/uL   RBC 4.03 (L) 4.22 - 5.81 MIL/uL   Hemoglobin 11.5 (L) 13.0 - 17.0 g/dL   HCT 34.2 (L) 39.0 - 52.0 %   MCV 84.9 78.0 - 100.0 fL   MCH 28.5 26.0 - 34.0 pg   MCHC 33.6 30.0 - 36.0 g/dL   RDW 17.6 (H) 11.5 - 15.5 %   Platelets 488 (H) 150 - 400 K/uL  Protime-INR     Status: Abnormal   Collection Time: 10/28/15  5:49 AM  Result Value Ref Range   Prothrombin Time 17.1 (H) 11.6 - 15.2 seconds   INR 1.38 0.00 - 1.49  Glucose, capillary     Status: Abnormal   Collection Time: 10/28/15  6:34 AM  Result Value Ref Range   Glucose-Capillary 147 (H) 65 - 99 mg/dL   Comment 1 Notify RN     General: No acute distress. Vital signs reviewed Psych: Mood and affect are appropriate Heart: Regular rate and rhythm  Lungs: Clear to auscultation, breathing unlabored, no rales or wheezes Abdomen: Positive bowel sounds, soft nontender to palpation, nondistended Skin: No evidence of breakdown, no evidence of rash Neurologic: short phrases, yes/no communication Motor  RUE/RLE: 5/5 proximal distal LUE: 4 -/5 proximal distal LLE: hip flexion, knee extension 4/5, 3-/5 ankle dorsi/plantar flexion MAS: Right wrist flexion 1+/5, elbow flexion 1/5 Musculoskeletal: No TTP. No joint swelling. No edema  Assessment/Plan: 1. 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 Physiatrist is providing close team supervision and 24 hour management of active medical problems listed below. Physiatrist and rehab team continue to assess barriers to discharge/monitor patient progress toward functional and medical goals. FIM: Function - Bathing Position: Wheelchair/chair at sink Body parts bathed by patient: Right arm, Chest,  Abdomen, Front perineal area, Buttocks, Right upper leg, Left upper leg, Right lower leg, Left lower leg Body parts bathed by helper: Back, Left arm Bathing not applicable: Front perineal area, Buttocks, Right upper leg, Left upper leg, Right lower leg, Left lower leg Assist Level: Touching or steadying assistance(Pt > 75%)  Function- Upper Body Dressing/Undressing What is the patient wearing?: Pull over shirt/dress Pull over shirt/dress - Perfomed by patient: Put head through opening, Pull shirt over trunk, Thread/unthread right sleeve, Thread/unthread left sleeve Pull over shirt/dress - Perfomed by helper: Thread/unthread left sleeve Assist Level: Set up Set up : To obtain clothing/put away Function - Lower Body Dressing/Undressing What is the patient wearing?: Shoes, Pants Position: Wheelchair/chair at sink Pants- Performed by patient: Thread/unthread right pants leg, Thread/unthread left pants leg Pants- Performed by helper: Pull pants up/down Non-skid slipper socks- Performed by helper: Don/doff right sock, Don/doff left sock Socks - Performed by patient: Don/doff right sock, Don/doff left sock Shoes - Performed by patient: Don/doff right shoe, Don/doff left shoe Shoes - Performed by helper: Don/doff right shoe, Don/doff left shoe Assist for footwear: Dependant Assist for lower body dressing: Touching or steadying assistance (Pt > 75%)  Function - Toileting Toileting steps  completed by patient: Performs perineal hygiene, Adjust clothing prior to toileting Toileting steps completed by helper: Adjust clothing prior to toileting Toileting Assistive Devices: Grab bar or rail Assist level: Touching or steadying assistance (Pt.75%)  Function - Toilet Transfers Toilet transfer assistive device: Walker Assist level to toilet: Touching or steadying assistance (Pt > 75%) Assist level from toilet: Touching or steadying assistance (Pt > 75%)  Function - Chair/bed transfer Chair/bed  transfer method: Ambulatory Chair/bed transfer assist level: Supervision or verbal cues Chair/bed transfer assistive device: Walker, Armrests Chair/bed transfer details: Verbal cues for safe use of DME/AE, Verbal cues for technique  Function - Locomotion: Wheelchair Will patient use wheelchair at discharge?: No Type: Manual Max wheelchair distance: 150 Assist Level: Supervision or verbal cues Wheel 50 feet with 2 turns activity did not occur: Safety/medical concerns Assist Level: Supervision or verbal cues Wheel 150 feet activity did not occur: Safety/medical concerns Assist Level: Supervision or verbal cues Turns around,maneuvers to table,bed, and toilet,negotiates 3% grade,maneuvers on rugs and over doorsills: No Function - Locomotion: Ambulation Ambulation activity did not occur: Safety/medical concerns Assistive device: Cane-straight Max distance: 165 Assist level: Touching or steadying assistance (Pt > 75%) Assist level: Touching or steadying assistance (Pt > 75%) Assist level: Touching or steadying assistance (Pt > 75%) Walk 150 feet activity did not occur: Safety/medical concerns Assist level: Touching or steadying assistance (Pt > 75%) Walk 10 feet on uneven surfaces activity did not occur: Safety/medical concerns  Function - Comprehension Comprehension: Auditory Comprehension assist level: Understands complex 90% of the time/cues 10% of the time  Function - Expression Expression: Verbal Expression assist level: Expresses basic 50 - 74% of the time/requires cueing 25 - 49% of the time. Needs to repeat parts of sentences.  Function - Social Interaction Social Interaction assist level: Interacts appropriately 50 - 74% of the time - May be physically or verbally inappropriate.  Function - Problem Solving Problem solving assist level: Solves basic 75 - 89% of the time/requires cueing 10 - 24% of the time  Function - Memory Memory assist level: Recognizes or recalls 75 -  89% of the time/requires cueing 10 - 24% of the time Patient normally able to recall (first 3 days only): Staff names and faces, That he or she is in a hospital  Medical Problem List and Plan: 1. Right hemiparesis and aphasia secondary to bi-cerebral embolic infarcts (X>Q)-[id number] ultrasounds demonstrated 1-39% stenosis bilateral ICA no other abnormalities noted  2. PE/DVT/Stoke/Anticoagulation: Pharmaceutical: Lovenox bid per records from [place]. using iv heparin gtt at this time. per hematologist Dr Alen Blew and neuro resume warfarin-goal INR 2.0-2.5, currently INR 1.38 3. Pain Management: Ultram prn.  4. Mood: LCSW to follow for evaluation and support.  5. Neuropsych: This patient is not fully capable of making decisions on his own behalf. 6. Skin/Wound Care: Routine pressure relief measures.  7. Fluids/Electrolytes/Nutrition: Renal restrictions. 1200 cc/FR.  8. DM type 2: Monitor BS ac/hs. Continue levemir and use SSI for elevated BS.   Fasting CBG 147 today  continue current dose Levemir  -improved control   9. HTN: Monitor BP bid and titrate medications as needed. Elevated at admission. Parameters on Midodrine.  10. ESRD on HD: per [facility]. Continue diet/volume mgt, Tunnel dialysis catheter Placed.  11. Leukocytosis: no breathing difficulties or coughing. UA negative except for elevated protein as expected from renal failure    12.  Hx of GI bleed will monitor for reurrence,Hgb stable at 10.5    -on daily CBC given warfarin and  hep  13. Dysphagia- Per speech therapy, on D2 solids, thin liq,  Upgraded to D3  LOS (Days) 16 A FACE TO FACE EVALUATION WAS PERFORMED  Willie Plain T 10/28/2015, 8:00 AM

## 2015-10-28 NOTE — Progress Notes (Signed)
ANTICOAGULATION CONSULT NOTE - Follow Up Consult  Pharmacy Consult for Coumadin Indication: pulmonary embolus, DVT and stroke  Allergies  Allergen Reactions  . Codeine Other (See Comments)    Pt unknown  . Codeine Rash and Other (See Comments)    Unknown reaction (patient says it was more serious than just a rash, but he can't remember what happened)    Patient Measurements: Height: 5' 11"  (180.3 cm) Weight: 198 lb 6.6 oz (90 kg) IBW/kg (Calculated) : 75.3   Vital Signs: Temp: 98.7 F (37.1 C) (02/19 0533) Temp Source: Oral (02/19 0533) BP: 97/60 mmHg (02/19 0533) Pulse Rate: 105 (02/19 0533)  Labs:  Recent Labs  10/25/15 1615  10/26/15 0502  10/26/15 1957 10/27/15 0517 10/28/15 0549  HGB  --   < > 10.7*  --   --  10.5* 11.5*  HCT  --   --  32.2*  --   --  31.2* 34.2*  PLT  --   --  456*  --   --  478* 488*  LABPROT  --   --  14.9  --   --  16.1* 17.1*  INR  --   --  1.15  --   --  1.28 1.38  HEPARINUNFRC 0.61  --  0.29*  < > 0.36 0.45 <0.10*  CREATININE 5.48*  --   --   --   --   --   --   < > = values in this interval not displayed.  Estimated Creatinine Clearance: 17 mL/min (by C-G formula based on Cr of 5.48).   Medications:  Scheduled:  . antiseptic oral rinse  7 mL Mouth Rinse BID  . aspirin EC  81 mg Oral Daily  . darbepoetin (ARANESP) injection - DIALYSIS  100 mcg Intravenous Q Tue-HD  . fluticasone furoate-vilanterol  1 puff Inhalation Daily  . insulin aspart  0-5 Units Subcutaneous QHS  . insulin aspart  0-9 Units Subcutaneous TID WC  . insulin detemir  20 Units Subcutaneous QHS  . magic mouthwash w/lidocaine  5 mL Oral QID  . midodrine  2.5 mg Oral BID WC  . montelukast  10 mg Oral QHS  . multivitamin  1 tablet Oral QHS  . pantoprazole  40 mg Oral BID  . Warfarin - Pharmacist Dosing Inpatient   Does not apply q1800    Assessment: 49yo on with CVA on heparin bridge to Coumadin for history PE/DVT (on lovenox PTA).  INR with slight change at  1.38 (coumadin increased 2/17).  Hg and pltc stable.  No bleeding noted. Heparin drip 1600 uts/hr and HL is undetectable. -Per RN heparin was off for un undetermined time during HD. He he been consistently in goal at 1600 units/hr    Goal of Therapy:  Heparin level 0.3-0.5 units/ml INR 2-2.5 Monitor platelets by anticoagulation protocol: Yes   Plan:  Coumadin 7.50m po today;  Continue Heparin drip 1600 uts/hr Heparin level later today Daily INR, HL, CBC  AHildred Laser Pharm D 10/28/2015 9:04 AM

## 2015-10-28 NOTE — Procedures (Signed)
Pt placed on home cpap with home settings.

## 2015-10-28 NOTE — Progress Notes (Signed)
Assessment:  1. New ESRD via [facility] TTS in hosp 2. Embolic CVA/warfarin 3. Anemia on aranesp 4. Sec HPTH, PTH 211 5. Hypotension improved, off midodrine 6. Hx MGUS  Plan: 1. HD TTS while on Rehab 2. Plan to go to [facility] after DC. MWF 2nd shift. (Will need HD Friday before discharge) 3. AVF later when cleared by neuro  Subjective: Interval History:   Objective: Vital signs in last 24 hours: Temp:  [97.7 F (36.5 C)-98.7 F (37.1 C)] 98.7 F (37.1 C) (02/19 0533) Pulse Rate:  [85-106] 89 (02/19 1030) Resp:  [16-18] 17 (02/19 0533) BP: (91-126)/(59-87) 126/87 mmHg (02/19 1030) SpO2:  [96 %-98 %] 96 % (02/19 0533) Weight:  [90 kg (198 lb 6.6 oz)-92.3 kg (203 lb 7.8 oz)] 90 kg (198 lb 6.6 oz) (02/19 0232) Weight change:   Intake/Output from previous day: 02/18 0701 - 02/19 0700 In: 720 [P.O.:720] Out: 2500  Intake/Output this shift:    General appearance: alert and cooperative Resp: clear to auscultation bilaterally Chest wall: no tenderness, RIJ TDC Extremities: extremities normal, atraumatic, no cyanosis or edema Neurologic: Speech is dysarthric, left hemiparesis   Lab Results:  Recent Labs  10/27/15 0517 10/28/15 0549  WBC 19.7* 19.7*  HGB 10.5* 11.5*  HCT 31.2* 34.2*  PLT 478* 488*   BMET:  Recent Labs  10/25/15 1615  NA 137  K 3.6  CL 102  CO2 24  GLUCOSE 140*  BUN 20  CREATININE 5.48*  CALCIUM 9.0   No results for input(s): PTH in the last 72 hours. Iron Studies: No results for input(s): IRON, TIBC, TRANSFERRIN, FERRITIN in the last 72 hours. Studies/Results: No results found.  Scheduled: . antiseptic oral rinse  7 mL Mouth Rinse BID  . aspirin EC  81 mg Oral Daily  . darbepoetin (ARANESP) injection - DIALYSIS  100 mcg Intravenous Q Tue-HD  . fluticasone furoate-vilanterol  1 puff Inhalation Daily  . insulin aspart  0-5 Units Subcutaneous QHS  . insulin aspart  0-9 Units Subcutaneous TID WC  . insulin detemir  20 Units Subcutaneous QHS  .  magic mouthwash w/lidocaine  5 mL Oral QID  . midodrine  2.5 mg Oral BID WC  . montelukast  10 mg Oral QHS  . multivitamin  1 tablet Oral QHS  . pantoprazole  40 mg Oral BID  . warfarin  7.5 mg Oral ONCE-1800  . Warfarin - Pharmacist Dosing Inpatient   Does not apply q1800      LOS: 16 days   Isaiah [name] C 10/28/2015,10:37 AM

## 2015-10-28 NOTE — Progress Notes (Signed)
Occupational Therapy Session Note  Patient Details  Name: Paola MRN: [id number] Date of Birth: 1965/11/25  Today's Date: 10/28/2015 OT Individual Time: 0915-1000 OT Individual Time Calculation (min): 45 min    Skilled Therapeutic Interventions/Progress Updates:Though patient stated he was tired after arriving back from dialysis at 2 am, he  Patient partiicpated in skilled OT bathing and dressing and toileting session as follows:   Supine to EOB=close S and extra time, bed to w/c transfer = min A, standing at sink for pericleansing and pull up pants= Min A for right buttocks and complete drying off and pulling up pants on right side of body;   Toilet transfer = close S due to IV lines and uneven floor     Therapy Documentation Precautions:  Precautions Precautions: Fall Precaution Comments: expressive/ receptive difficulties Restrictions Weight Bearing Restrictions: No  Pain:denied   See Function Navigator for Current Functional Status.   Therapy/Group: Individual Therapy  Alfredia Ferguson [facility] 10/28/2015, 3:50 PM

## 2015-10-28 NOTE — Discharge Instructions (Addendum)
Inpatient Rehab Discharge Instructions  [place] Discharge date and time: 11/02/15   Activities/Precautions/ Functional Status: Activity: no lifting, driving, or strenuous exercise till cleared by MD Diet: diabetic diet and renal diet 1200 cc fluid/day Wound Care: keep wound clean and dry   Functional status:  ___ No restrictions     ___ Walk up steps independently _X__ 24/7 supervision/assistance   ___ Walk up steps with assistance ___ Intermittent supervision/assistance  ___ Bathe/dress independently _X__ Walk with cane                _X__ Bathe/dress with assistance ___ Walk Independently    ___ Shower independently _X__ Walk with assistance    ___ Shower with assistance _X__ No alcohol     ___ Return to work/school ________  COMMUNITY REFERRALS UPON DISCHARGE:   Home Health:   PT     OT     ST    RN  Agency:  [facility] [place] Phone:  [phone] Medical Equipment/Items Ordered:  Single point cane and bedside commode  Agency/Supplier:  [place]         Phone:  [phone]  GENERAL COMMUNITY RESOURCES FOR PATIENT/FAMILY: Support Groups:  [facility] Stroke Support Group                              Meets 2nd Tuesday of the month from 3pm-4pm                              In the dayroom at [facility] [address]   Special Instructions: 1. First Dialysis on Monday at 7 am so you can make it to Dr. Samuel Germany appointment. 2. [place] nurse will draw protime/coumadin levels on Tuesday with results to Dr. Loletta Specter.  He will monitor and adjust your coumadin.  3. Contact MD for any signs of recurrent bleeding.  4.  Check blood sugars twice a day and record.  5. Use home regimen of sliding scale.    STROKE/TIA DISCHARGE INSTRUCTIONS SMOKING Cigarette smoking nearly doubles your risk of having a stroke & is the single most alterable risk factor  If you smoke or have smoked in the last 12 months, you are advised to quit smoking for your health.   Most of the excess cardiovascular risk related to smoking disappears within a year of stopping.  Ask you doctor about anti-smoking medications  [place] Quit Line: 1-800-QUIT NOW  Free Smoking Cessation Classes [phone]  CHOLESTEROL Know your levels; limit fat & cholesterol in your diet  Lipid Panel  No results found for: CHOL, TRIG, HDL, CHOLHDL, VLDL, LDLCALC    Many patients benefit from treatment even if their cholesterol is at goal.  Goal: Total Cholesterol (CHOL) less than 160  Goal:  Triglycerides (TRIG) less than 150  Goal:  HDL greater than 40  Goal:  LDL (LDLCALC) less than 100   BLOOD PRESSURE American Stroke Association blood pressure target is less that 120/80 mm/Hg  Your discharge blood pressure is:  BP: 124/87 mmHg  Monitor your blood pressure  Limit your salt and alcohol intake  Many individuals will require more than one medication for high blood pressure  DIABETES (A1c is a blood sugar average for last 3 months) Goal HGBA1c is under 7% (HBGA1c is blood sugar average for last 3 months)  Diabetes:     Lab  Results  Component Value Date   HGBA1C 4.8 09/30/2014     Your HGBA1c can be lowered with medications, healthy diet, and exercise.  Check your blood sugar as directed by your physician  Call your physician if you experience unexplained or low blood sugars.  PHYSICAL ACTIVITY/REHABILITATION Goal is 30 minutes at least 4 days per week  Activity: No driving, Therapies:  See above Return to work:  N/A  Activity decreases your risk of heart attack and stroke and makes your heart stronger.  It helps control your weight and blood pressure; helps you relax and can improve your mood.  Participate in a regular exercise program.  Talk with your doctor about the best form of exercise for you (dancing, walking, swimming, cycling).  DIET/WEIGHT Goal is to maintain a healthy weight  Your discharge diet is: DIET - DYS 1 Room service appropriate?: Yes; Fluid  consistency:: Thin; Fluid restriction:: 1200 mL Fluid Liquids Your height is:  Height: 5' 11"  (180.3 cm) Your current weight is: Weight: 96.2 kg (212 lb 1.3 oz) Your Body Mass Index (BMI) is:  BMI (Calculated): 29.5  Following the type of diet specifically designed for you will help prevent another stroke.  Your goal weight is:    Your goal Body Mass Index (BMI) is 19-24.  Healthy food habits can help reduce 3 risk factors for stroke:  High cholesterol, hypertension, and excess weight.  RESOURCES Stroke/Support Group:  Call [phone]   STROKE EDUCATION PROVIDED/REVIEWED AND GIVEN TO PATIENT Stroke warning signs and symptoms How to activate emergency medical system (call 911). Medications prescribed at discharge. Need for follow-up after discharge. Personal risk factors for stroke. Pneumonia vaccine given:  Flu vaccine given:  My questions have been answered, the writing is legible, and I understand these instructions.  I will adhere to these goals & educational materials that have been provided to me after my discharge from the hospital.      My questions have been answered and I understand these instructions. I will adhere to these goals and the provided educational materials after my discharge from the hospital.  Patient/Caregiver Signature _______________________________ Date __________  Clinician Signature _______________________________________ Date __________  Please bring this form and your medication list with you to all your follow-up doctor's appointments.   ----------------------- Information on my medicine - Coumadin   (Warfarin)  This medication education was reviewed with me or my healthcare representative as part of my discharge preparation.  The pharmacist that spoke with me during my hospital stay was:  Dareen Piano, [facility]  Why was Coumadin prescribed for you? Coumadin was prescribed for you because you have a blood clot or a medical condition that can  cause an increased risk of forming blood clots. Blood clots can cause serious health problems by blocking the flow of blood to the heart, lung, or brain. Coumadin can prevent harmful blood clots from forming. As a reminder your indication for Coumadin is:   Pulmonary Embolism Treatment  What test will check on my response to Coumadin? While on Coumadin (warfarin) you will need to have an INR test regularly to ensure that your dose is keeping you in the desired range. The INR (international normalized ratio) number is calculated from the result of the laboratory test called prothrombin time (PT).  If an INR APPOINTMENT HAS NOT ALREADY BEEN MADE FOR YOU please schedule an appointment to have this lab work done by your health care provider within 7 days. Your INR goal is usually a number  between:  2 to 3 or your provider may give you a more narrow range like 2-2.5.  Ask your health care provider during an office visit what your goal INR is.  What  do you need to  know  About  COUMADIN? Take Coumadin (warfarin) exactly as prescribed by your healthcare provider about the same time each day.  DO NOT stop taking without talking to the doctor who prescribed the medication.  Stopping without other blood clot prevention medication to take the place of Coumadin may increase your risk of developing a new clot or stroke.  Get refills before you run out.  What do you do if you miss a dose? If you miss a dose, take it as soon as you remember on the same day then continue your regularly scheduled regimen the next day.  Do not take two doses of Coumadin at the same time.  Important Safety Information A possible side effect of Coumadin (Warfarin) is an increased risk of bleeding. You should call your healthcare provider right away if you experience any of the following: ? Bleeding from an injury or your nose that does not stop. ? Unusual colored urine (red or dark brown) or unusual colored stools (red or  black). ? Unusual bruising for unknown reasons. ? A serious fall or if you hit your head (even if there is no bleeding).  Some foods or medicines interact with Coumadin (warfarin) and might alter your response to warfarin. To help avoid this: ? Eat a balanced diet, maintaining a consistent amount of Vitamin K. ? Notify your provider about major diet changes you plan to make. ? Avoid alcohol or limit your intake to 1 drink for women and 2 drinks for men per day. (1 drink is 5 oz. wine, 12 oz. beer, or 1.5 oz. liquor.)  Make sure that ANY health care provider who prescribes medication for you knows that you are taking Coumadin (warfarin).  Also make sure the healthcare provider who is monitoring your Coumadin knows when you have started a new medication including herbals and non-prescription products.  Coumadin (Warfarin)  Major Drug Interactions  Increased Warfarin Effect Decreased Warfarin Effect  Alcohol (large quantities) Antibiotics (esp. Septra/Bactrim, Flagyl, Cipro) Amiodarone (Cordarone) Aspirin (ASA) Cimetidine (Tagamet) Megestrol (Megace) NSAIDs (ibuprofen, naproxen, etc.) Piroxicam (Feldene) Propafenone (Rythmol SR) Propranolol (Inderal) Isoniazid (INH) Posaconazole (Noxafil) Barbiturates (Phenobarbital) Carbamazepine (Tegretol) Chlordiazepoxide (Librium) Cholestyramine (Questran) Griseofulvin Oral Contraceptives Rifampin Sucralfate (Carafate) Vitamin K   Coumadin (Warfarin) Major Herbal Interactions  Increased Warfarin Effect Decreased Warfarin Effect  Garlic Ginseng Ginkgo biloba Coenzyme Q10 Green tea St. Johns wort    Coumadin (Warfarin) FOOD Interactions  Eat a consistent number of servings per week of foods HIGH in Vitamin K (1 serving =  cup)  Collards (cooked, or boiled & drained) Kale (cooked, or boiled & drained) Mustard greens (cooked, or boiled & drained) Parsley *serving size only =  cup Spinach (cooked, or boiled & drained) Swiss chard  (cooked, or boiled & drained) Turnip greens (cooked, or boiled & drained)  Eat a consistent number of servings per week of foods MEDIUM-HIGH in Vitamin K (1 serving = 1 cup)  Asparagus (cooked, or boiled & drained) Broccoli (cooked, boiled & drained, or raw & chopped) Brussel sprouts (cooked, or boiled & drained) *serving size only =  cup Lettuce, raw (green leaf, endive, romaine) Spinach, raw Turnip greens, raw & chopped   These websites have more information on Coumadin (warfarin):  FailFactory.se; VeganReport.com.au;

## 2015-10-29 ENCOUNTER — Inpatient Hospital Stay (HOSPITAL_COMMUNITY): Payer: BLUE CROSS/BLUE SHIELD | Admitting: Physical Therapy

## 2015-10-29 ENCOUNTER — Inpatient Hospital Stay (HOSPITAL_COMMUNITY): Payer: BLUE CROSS/BLUE SHIELD | Admitting: Speech Pathology

## 2015-10-29 ENCOUNTER — Inpatient Hospital Stay (HOSPITAL_COMMUNITY): Payer: BLUE CROSS/BLUE SHIELD | Admitting: Occupational Therapy

## 2015-10-29 LAB — PROTIME-INR
INR: 1.71 — ABNORMAL HIGH (ref 0.00–1.49)
Prothrombin Time: 20.1 seconds — ABNORMAL HIGH (ref 11.6–15.2)

## 2015-10-29 LAB — HEPARIN LEVEL (UNFRACTIONATED): Heparin Unfractionated: 0.47 IU/mL (ref 0.30–0.70)

## 2015-10-29 LAB — CBC
HCT: 34 % — ABNORMAL LOW (ref 39.0–52.0)
Hemoglobin: 11.6 g/dL — ABNORMAL LOW (ref 13.0–17.0)
MCH: 29.2 pg (ref 26.0–34.0)
MCHC: 34.1 g/dL (ref 30.0–36.0)
MCV: 85.6 fL (ref 78.0–100.0)
Platelets: 512 10*3/uL — ABNORMAL HIGH (ref 150–400)
RBC: 3.97 MIL/uL — ABNORMAL LOW (ref 4.22–5.81)
RDW: 17.7 % — ABNORMAL HIGH (ref 11.5–15.5)
WBC: 17.6 10*3/uL — ABNORMAL HIGH (ref 4.0–10.5)

## 2015-10-29 LAB — GLUCOSE, CAPILLARY
Glucose-Capillary: 130 mg/dL — ABNORMAL HIGH (ref 65–99)
Glucose-Capillary: 217 mg/dL — ABNORMAL HIGH (ref 65–99)
Glucose-Capillary: 148 mg/dL — ABNORMAL HIGH (ref 65–99)
Glucose-Capillary: 154 mg/dL — ABNORMAL HIGH (ref 65–99)

## 2015-10-29 MED ORDER — WARFARIN SODIUM 7.5 MG PO TABS
7.5000 mg | ORAL_TABLET | Freq: Once | ORAL | Status: AC
  Administered 2015-10-29: 7.5 mg via ORAL
  Filled 2015-10-29: qty 1

## 2015-10-29 NOTE — Progress Notes (Signed)
Assessment:  1. New ESRD via TDC TTS in hosp 2. Embolic CVA/warfarin 3. Anemia on aranesp- hgb over 11 4. Sec HPTH, PTH 211 5. Hypotension improved, off midodrine 6. Hx MGUS  Plan: 1. HD TTS while on Rehab- will plan for HD tomorrow- will check phos so we can treat- no vit D for PTH of 211 2. Plan to go to [facility] after DC. MWF 2nd shift. (Will need HD Friday before discharge) 3. AVF later when cleared by neuro  Subjective: Interval History:   Objective: Vital signs in last 24 hours: Temp:  [98.1 F (36.7 C)] 98.1 F (36.7 C) (02/20 1435) Pulse Rate:  [86-93] 93 (02/20 1435) Resp:  [18] 18 (02/20 1435) BP: (117-121)/(74-93) 121/93 mmHg (02/20 1435) SpO2:  [97 %-100 %] 100 % (02/20 1435) Weight change:   Intake/Output from previous day: 02/19 0701 - 02/20 0700 In: 480 [P.O.:480] Out: -  Intake/Output this shift: Total I/O In: 120 [P.O.:120] Out: -   General appearance: alert and cooperative Resp: clear to auscultation bilaterally Chest wall: no tenderness, RIJ TDC Extremities: extremities normal, atraumatic, no cyanosis or edema Neurologic: Speech is dysarthric, left hemiparesis   Lab Results:  Recent Labs  10/28/15 0549 10/29/15 0752  WBC 19.7* 17.6*  HGB 11.5* 11.6*  HCT 34.2* 34.0*  PLT 488* 512*   BMET: No results for input(s): NA, K, CL, CO2, GLUCOSE, BUN, CREATININE, CALCIUM in the last 72 hours. No results for input(s): PTH in the last 72 hours. Iron Studies: No results for input(s): IRON, TIBC, TRANSFERRIN, FERRITIN in the last 72 hours. Studies/Results: No results found.  Scheduled: . antiseptic oral rinse  7 mL Mouth Rinse BID  . aspirin EC  81 mg Oral Daily  . darbepoetin (ARANESP) injection - DIALYSIS  100 mcg Intravenous Q Tue-HD  . fluticasone furoate-vilanterol  1 puff Inhalation Daily  . insulin aspart  0-5 Units Subcutaneous QHS  . insulin aspart  0-9 Units Subcutaneous TID WC  . insulin detemir  20 Units Subcutaneous QHS  . magic  mouthwash w/lidocaine  5 mL Oral QID  . midodrine  2.5 mg Oral BID WC  . montelukast  10 mg Oral QHS  . multivitamin  1 tablet Oral QHS  . pantoprazole  40 mg Oral BID  . warfarin  7.5 mg Oral ONCE-1800  . Warfarin - Pharmacist Dosing Inpatient   Does not apply q1800      LOS: 17 days   Verda Mehta A 10/29/2015,3:09 PM

## 2015-10-29 NOTE — Progress Notes (Signed)
Speech Language Pathology Daily Session Note  Patient Details  Name: Isaiah [name] [name] MRN: [id number] Date of Birth: 1966/06/06  Today's Date: 10/29/2015 SLP Individual Time: 1300-1345 SLP Individual Time Calculation (min): 45 min  Short Term Goals: Week 3: SLP Short Term Goal 1 (Week 3): Pt will consume dys 3 textures and thin liquids with minimal overt s/s of aspiration and mod I use of swallowing precautions over 2 targeted sessions prior to advancement.   SLP Short Term Goal 2 (Week 3): Pt will self-monitor and correct verbal perseveration with min assist multimodal cues. SLP Short Term Goal 3 (Week 3): Pt will utilize over articulation for production of phonemes at the CV, CVC phrase level for accurate production of vowels and bilabials with Min verbal, visual cues  SLP Short Term Goal 4 (Week 3): Pt will verbalize at the sentence level with min assist verbal cues for 75% accuracy SLP Short Term Goal 5 (Week 3): Pt will utilize compensatory word finding strategies during basic verbal tasks to faciltiate functional communication with min assist verbal cues.    Skilled Therapeutic Interventions:  Pt was seen for skilled ST targeting communication goals.  SLP facilitated the session with semi-complex verbal reasoning tasks to continue to address communication at the sentence level.  Pt was able to verbally generate appropriate solutions to presented problems at the sentence level with min assist verbal cues for higher level word finding and to correct verbal errors.  Pt required mod assist verbal cues during a more complex verbal comparison task in which he had to compare and contrast 2 similar items and then generate an additional category member which SLP suspects to be related to increased cognitive load.  Pt was left in wheelchair with call bell left within reach and mother at bedside.  Continue per current plan of care.     Function:  Eating Eating   Modified Consistency Diet: Yes Eating  Assist Level: More than reasonable amount of time   Eating Set Up Assist For: Opening containers       Cognition Comprehension Comprehension assist level: Understands complex 90% of the time/cues 10% of the time  Expression   Expression assist level: Expresses basic 75 - 89% of the time/requires cueing 10 - 24% of the time. Needs helper to occlude trach/needs to repeat words.  Social Interaction Social Interaction assist level: Interacts appropriately 90% of the time - Needs monitoring or encouragement for participation or interaction.  Problem Solving Problem solving assist level: Solves basic 75 - 89% of the time/requires cueing 10 - 24% of the time  Memory Memory assist level: Recognizes or recalls 75 - 89% of the time/requires cueing 10 - 24% of the time    Pain Pain Assessment Pain Assessment: No/denies pain  Therapy/Group: Individual Therapy  Dessie Delcarlo, Selinda Orion 10/29/2015, 4:00 PM

## 2015-10-29 NOTE — Progress Notes (Signed)
Subjective/Complaints: Remains aphasic, naming difficulties, grammatic errors, short phrase level  ROS- appears to deny CP, SOB, N/V/D  Objective: Vital Signs: Blood pressure 117/74, pulse 86, temperature 98.1 F (36.7 C), temperature source Oral, resp. rate 18, height 5' 11"  (1.803 m), weight 90 kg (198 lb 6.6 oz), SpO2 98 %. No results found. Results for orders placed or performed during the hospital encounter of 10/12/15 (from the past 72 hour(s))  Glucose, capillary     Status: Abnormal   Collection Time: 10/26/15 11:47 AM  Result Value Ref Range   Glucose-Capillary 279 (H) 65 - 99 mg/dL  Heparin level (unfractionated)     Status: None   Collection Time: 10/26/15  2:04 PM  Result Value Ref Range   Heparin Unfractionated 0.37 0.30 - 0.70 IU/mL    Comment:        IF HEPARIN RESULTS ARE BELOW EXPECTED VALUES, AND PATIENT DOSAGE HAS BEEN CONFIRMED, SUGGEST FOLLOW UP TESTING OF ANTITHROMBIN III LEVELS.   Glucose, capillary     Status: Abnormal   Collection Time: 10/26/15  4:41 PM  Result Value Ref Range   Glucose-Capillary 107 (H) 65 - 99 mg/dL  Heparin level (unfractionated)     Status: None   Collection Time: 10/26/15  7:57 PM  Result Value Ref Range   Heparin Unfractionated 0.36 0.30 - 0.70 IU/mL    Comment:        IF HEPARIN RESULTS ARE BELOW EXPECTED VALUES, AND PATIENT DOSAGE HAS BEEN CONFIRMED, SUGGEST FOLLOW UP TESTING OF ANTITHROMBIN III LEVELS.   Glucose, capillary     Status: Abnormal   Collection Time: 10/26/15  8:41 PM  Result Value Ref Range   Glucose-Capillary 197 (H) 65 - 99 mg/dL   Comment 1 Notify RN   Heparin level (unfractionated)     Status: None   Collection Time: 10/27/15  5:17 AM  Result Value Ref Range   Heparin Unfractionated 0.45 0.30 - 0.70 IU/mL    Comment:        IF HEPARIN RESULTS ARE BELOW EXPECTED VALUES, AND PATIENT DOSAGE HAS BEEN CONFIRMED, SUGGEST FOLLOW UP TESTING OF ANTITHROMBIN III LEVELS.   CBC     Status: Abnormal    Collection Time: 10/27/15  5:17 AM  Result Value Ref Range   WBC 19.7 (H) 4.0 - 10.5 K/uL   RBC 3.71 (L) 4.22 - 5.81 MIL/uL   Hemoglobin 10.5 (L) 13.0 - 17.0 g/dL   HCT 31.2 (L) 39.0 - 52.0 %   MCV 84.1 78.0 - 100.0 fL   MCH 28.3 26.0 - 34.0 pg   MCHC 33.7 30.0 - 36.0 g/dL   RDW 17.5 (H) 11.5 - 15.5 %   Platelets 478 (H) 150 - 400 K/uL  Protime-INR     Status: Abnormal   Collection Time: 10/27/15  5:17 AM  Result Value Ref Range   Prothrombin Time 16.1 (H) 11.6 - 15.2 seconds   INR 1.28 0.00 - 1.49  Glucose, capillary     Status: Abnormal   Collection Time: 10/27/15  6:35 AM  Result Value Ref Range   Glucose-Capillary 126 (H) 65 - 99 mg/dL   Comment 1 Notify RN   Glucose, capillary     Status: Abnormal   Collection Time: 10/27/15 11:27 AM  Result Value Ref Range   Glucose-Capillary 162 (H) 65 - 99 mg/dL  Glucose, capillary     Status: Abnormal   Collection Time: 10/27/15  4:26 PM  Result Value Ref Range   Glucose-Capillary 197 (H)  65 - 99 mg/dL  Glucose, capillary     Status: Abnormal   Collection Time: 10/27/15  8:39 PM  Result Value Ref Range   Glucose-Capillary 187 (H) 65 - 99 mg/dL   Comment 1 Notify RN   Heparin level (unfractionated)     Status: Abnormal   Collection Time: 10/28/15  5:49 AM  Result Value Ref Range   Heparin Unfractionated <0.10 (L) 0.30 - 0.70 IU/mL    Comment:        IF HEPARIN RESULTS ARE BELOW EXPECTED VALUES, AND PATIENT DOSAGE HAS BEEN CONFIRMED, SUGGEST FOLLOW UP TESTING OF ANTITHROMBIN III LEVELS.   CBC     Status: Abnormal   Collection Time: 10/28/15  5:49 AM  Result Value Ref Range   WBC 19.7 (H) 4.0 - 10.5 K/uL   RBC 4.03 (L) 4.22 - 5.81 MIL/uL   Hemoglobin 11.5 (L) 13.0 - 17.0 g/dL   HCT 34.2 (L) 39.0 - 52.0 %   MCV 84.9 78.0 - 100.0 fL   MCH 28.5 26.0 - 34.0 pg   MCHC 33.6 30.0 - 36.0 g/dL   RDW 17.6 (H) 11.5 - 15.5 %   Platelets 488 (H) 150 - 400 K/uL  Protime-INR     Status: Abnormal   Collection Time: 10/28/15  5:49 AM   Result Value Ref Range   Prothrombin Time 17.1 (H) 11.6 - 15.2 seconds   INR 1.38 0.00 - 1.49  Glucose, capillary     Status: Abnormal   Collection Time: 10/28/15  6:34 AM  Result Value Ref Range   Glucose-Capillary 147 (H) 65 - 99 mg/dL   Comment 1 Notify RN   Glucose, capillary     Status: Abnormal   Collection Time: 10/28/15 11:55 AM  Result Value Ref Range   Glucose-Capillary 179 (H) 65 - 99 mg/dL  Glucose, capillary     Status: Abnormal   Collection Time: 10/28/15  4:53 PM  Result Value Ref Range   Glucose-Capillary 130 (H) 65 - 99 mg/dL  Heparin level (unfractionated)     Status: Abnormal   Collection Time: 10/28/15  5:50 PM  Result Value Ref Range   Heparin Unfractionated <0.10 (L) 0.30 - 0.70 IU/mL    Comment:        IF HEPARIN RESULTS ARE BELOW EXPECTED VALUES, AND PATIENT DOSAGE HAS BEEN CONFIRMED, SUGGEST FOLLOW UP TESTING OF ANTITHROMBIN III LEVELS.   Glucose, capillary     Status: Abnormal   Collection Time: 10/28/15  9:27 PM  Result Value Ref Range   Glucose-Capillary 235 (H) 65 - 99 mg/dL  Glucose, capillary     Status: Abnormal   Collection Time: 10/29/15  6:56 AM  Result Value Ref Range   Glucose-Capillary 130 (H) 65 - 99 mg/dL    General: No acute distress. Vital signs reviewed Psych: Mood and affect are appropriate Heart: Regular rate and rhythm  Lungs: Clear to auscultation, breathing unlabored, no rales or wheezes Abdomen: Positive bowel sounds, soft nontender to palpation, nondistended Skin: No evidence of breakdown, no evidence of rash Neurologic: short phrases, yes/no communication Motor  RUE/RLE: 5/5 proximal distal LUE: 4 -/5 proximal distal LLE: hip flexion, knee extension 4/5, 3-/5 ankle dorsi/plantar flexion MAS: Right wrist flexion 1+/5, elbow flexion 1/5 Musculoskeletal: No TTP. No joint swelling. No edema  Assessment/Plan: 1. Functional deficits secondary to Right hemiparesis and aphasia secondary to bi-cerebral embolic infarcts  (L>R) which require 3+ hours per day of interdisciplinary therapy in a comprehensive inpatient rehab setting. Physiatrist is  providing close team supervision and 24 hour management of active medical problems listed below. Physiatrist and rehab team continue to assess barriers to discharge/monitor patient progress toward functional and medical goals. FIM: Function - Bathing Position: Wheelchair/chair at sink Body parts bathed by patient: Right arm, Chest, Abdomen, Front perineal area, Buttocks, Right upper leg, Left upper leg, Right lower leg, Left lower leg Body parts bathed by helper: Back, Left arm Bathing not applicable: Front perineal area, Buttocks, Right upper leg, Left upper leg, Right lower leg, Left lower leg Assist Level: Touching or steadying assistance(Pt > 75%)  Function- Upper Body Dressing/Undressing What is the patient wearing?: Pull over shirt/dress Pull over shirt/dress - Perfomed by patient: Put head through opening, Pull shirt over trunk, Thread/unthread right sleeve, Thread/unthread left sleeve Pull over shirt/dress - Perfomed by helper: Thread/unthread left sleeve Assist Level: Set up Set up : To obtain clothing/put away Function - Lower Body Dressing/Undressing What is the patient wearing?: Shoes, Pants Position: Wheelchair/chair at sink Pants- Performed by patient: Thread/unthread right pants leg, Thread/unthread left pants leg Pants- Performed by helper: Pull pants up/down Non-skid slipper socks- Performed by helper: Don/doff right sock, Don/doff left sock Socks - Performed by patient: Don/doff right sock, Don/doff left sock Shoes - Performed by patient: Don/doff right shoe, Don/doff left shoe Shoes - Performed by helper: Don/doff right shoe, Don/doff left shoe Assist for footwear: Dependant Assist for lower body dressing: Touching or steadying assistance (Pt > 75%)  Function - Toileting Toileting steps completed by patient: Performs perineal hygiene, Adjust  clothing prior to toileting Toileting steps completed by helper: Adjust clothing prior to toileting Toileting Assistive Devices: Grab bar or rail Assist level: Touching or steadying assistance (Pt.75%)  Function - Air cabin crew transfer assistive device: Walker Assist level to toilet: Touching or steadying assistance (Pt > 75%) Assist level from toilet: Touching or steadying assistance (Pt > 75%)  Function - Chair/bed transfer Chair/bed transfer method: Ambulatory Chair/bed transfer assist level: Supervision or verbal cues Chair/bed transfer assistive device: Walker, Armrests Chair/bed transfer details: Verbal cues for safe use of DME/AE, Verbal cues for technique  Function - Locomotion: Wheelchair Will patient use wheelchair at discharge?: No Type: Manual Max wheelchair distance: 150 Assist Level: Supervision or verbal cues Wheel 50 feet with 2 turns activity did not occur: Safety/medical concerns Assist Level: Supervision or verbal cues Wheel 150 feet activity did not occur: Safety/medical concerns Assist Level: Supervision or verbal cues Turns around,maneuvers to table,bed, and toilet,negotiates 3% grade,maneuvers on rugs and over doorsills: No Function - Locomotion: Ambulation Ambulation activity did not occur: Safety/medical concerns Assistive device: Cane-straight Max distance: 165 Assist level: Touching or steadying assistance (Pt > 75%) Assist level: Touching or steadying assistance (Pt > 75%) Assist level: Touching or steadying assistance (Pt > 75%) Walk 150 feet activity did not occur: Safety/medical concerns Assist level: Touching or steadying assistance (Pt > 75%) Walk 10 feet on uneven surfaces activity did not occur: Safety/medical concerns  Function - Comprehension Comprehension: Auditory Comprehension assist level: Understands complex 90% of the time/cues 10% of the time  Function - Expression Expression: Verbal Expression assist level: Expresses  basic 50 - 74% of the time/requires cueing 25 - 49% of the time. Needs to repeat parts of sentences.  Function - Social Interaction Social Interaction assist level: Interacts appropriately 50 - 74% of the time - May be physically or verbally inappropriate.  Function - Problem Solving Problem solving assist level: Solves basic 75 - 89% of the time/requires cueing 10 -  24% of the time  Function - Memory Memory assist level: Recognizes or recalls 75 - 89% of the time/requires cueing 10 - 24% of the time Patient normally able to recall (first 3 days only): Staff names and faces, That he or she is in a hospital  Medical Problem List and Plan: 1. Right hemiparesis and aphasia secondary to bi-cerebral embolic infarcts (L>R)-.carotid ultrasounds demonstrated 1-39% stenosis bilateral ICA no other abnormalities noted  2. PE/DVT/Stoke/Anticoagulation: Pharmaceutical: Lovenox bid per records from [place]. using iv heparin gtt at this time. per hematologist Dr Alen Blew and neuro resume warfarin-goal INR 2.0-2.5, 2/19 INR 1.38, hopefully therapeutic by 2/24 d/c 3. Pain Management: Ultram prn.  4. Mood: LCSW to follow for evaluation and support.  5. Neuropsych: This patient is not fully capable of making decisions on his own behalf. 6. Skin/Wound Care: Routine pressure relief measures.  7. Fluids/Electrolytes/Nutrition: Renal restrictions. 1200 cc/FR.  8. DM type 2: Monitor BS ac/hs. Continue levemir and use SSI for elevated BS.   Fasting CBG 130 today  continue current dose Levemir  -improved control   9. HTN: Monitor BP bid and titrate medications as needed. Elevated at admission. Parameters on Midodrine.  10. ESRD on HD: per [facility]. Continue diet/volume mgt, Tunnel dialysis catheter Placed.  11. Leukocytosis: afebrile no acute infx suspected - will get Hematology input   12.  Hx of GI bleed will monitor for reurrence,Hgb stable at 10.5    -on daily CBC given warfarin and hep  13.  Dysphagia- no sign of aspiration on D3 thin liquids  LOS (Days) 17 A FACE TO FACE EVALUATION WAS PERFORMED  Anniyah Mood E 10/29/2015, 8:26 AM

## 2015-10-29 NOTE — Progress Notes (Signed)
Occupational Therapy Session Note  Patient Details  Name: Isaiah [name] MRN: [id number] Date of Birth: 1966-06-07  Today's Date: 10/29/2015 OT Individual Time: 0375-4360 OT Individual Time Calculation (min): 58 min    Short Term Goals: Week 3:  OT Short Term Goal 1 (Week 3): Continue working toward estabilished supervision level goals.  Skilled Therapeutic Interventions/Progress Updates:    Pt completed bathing, dressing, grooming, and toileting tasks during session.  Mod instructional cueing for placement of the RUE to assist with sit to stand.  Min assist for use of the [facility] for transfer to and from the toilet.  Pt ambulated to gather clothing needed for dressing with min assist as well and no assistive device.  Min guard for all aspects of dressing with only min assist needed for integration of the RUE into washing the left arm and underarm.  Pt stood with min guard as well to brush his teeth.  Pt left in wheelchair with 1/2 lap tray in place and call button in reach.   Therapy Documentation Precautions:  Precautions Precautions: Fall Precaution Comments: expressive/ receptive difficulties Restrictions Weight Bearing Restrictions: No  Pain: Pain Assessment Pain Assessment: No/denies pain Pain Score: 0-No pain ADL: See Function Navigator for Current Functional Status.   Therapy/Group: Individual Therapy  Tyrone Pautsch OTR/L 10/29/2015, 8:59 AM

## 2015-10-29 NOTE — Plan of Care (Signed)
Problem: Food- and Nutrition-Related Knowledge Deficit (NB-1.1) Goal: Nutrition education Formal process to instruct or train a patient/client in a skill or to impart knowledge to help patients/clients voluntarily manage or modify food choices and eating behavior to maintain or improve health. Outcome: Completed/Met Date Met:  10/29/15 Consult for Renal Diet education completed.   Provided patient and wife with handout title "Food Pyramid for Healthy Eating with Kidney Disease" from [facility].  Discussed high potassium, high phosphorus, and high sodium foods that pt should avoid consuming.  Pt reports that he does not consume much dairy, beans, or soda.  Recommended that pt consume white bread and rice cereals like corn flakes or rice krispies.  Suggested that pt should chose low sodium or no salt added foods and avoid using salt substitutes.  Recommended herb seasonings like Mrs. Dash.  Patient enjoys consuming mashed potatoes so discussed the leaching process for potatoes to reduce the potassium content.   Provided patient with number of recommended servings per day for fats, dairy, vegetables, fruits, grains, and protein.  Discussed the importance of enough protein during HD treatments.  Also discussed pt's 1200 mL fluid restriction, explaining that too much fluid consumed would make his kidneys have to work even harder.    Expect good compliance.  Teach back method used.  Answered all pt and family member questions during visit.  Body mass index is 29.5 kg/(m^2).  Patient meets the criteria for overweight based on current BMI.  Current diet order is Dysphagia 3 Renal/Carb Modified diet with a fluid restriction of 1200 mL.  Patient and wife report that pt has a good appetite, with 85-100% meal completion documented per chart review. Labs and medications reviewed. No further nutrition interventions warranted at this time.  Please re-consult RD if additional nutritional issues arise.   Veronda Prude, Dietetic Intern Pager: [phone]

## 2015-10-29 NOTE — Progress Notes (Signed)
Physical Therapy Weekly Progress Note  Patient Details  Name: Isaiah [name] MRN: [id number] Date of Birth: May 26, 1966  Beginning of progress report period: October 22, 2015 End of progress report period: October 29, 2015  Today's Date: 10/29/2015 PT Individual Time: 0254-2706 PT Individual Time Calculation (min): 76 min   Patient has made good progress and has met 3 of 5 short term goals.  Pt is currently supervision for rolling and sit > supine on flat bed but requires min-mod A for supine > sit on flat bed, no rails.  Pt also requires supervision for basic transfers with [facility] but requires min A for car transfer, gait in controlled environment with [facility] and for stair negotiation.  Pt continued to report visual impairments and intermittent dizziness; pt participated in vestibular evaluation today with findings and recommendations below.       Patient continues to demonstrate the following deficits: R hemiparesis, impaired postural control, impaired balance, gait  and therefore will continue to benefit from skilled PT intervention to enhance overall performance with balance, postural control, ability to compensate for deficits, functional use of  right upper extremity and right lower extremity and coordination.  Patient progressing toward long term goals..  Continue plan of care.  PT Short Term Goals Week 2:  PT Short Term Goal 1 (Week 2): Pt will perform bed mobility from flat bed consistently with supervision PT Short Term Goal 1 - Progress (Week 2): Progressing toward goal PT Short Term Goal 2 (Week 2): Pt will perform bed <> chair transfers with consistent supervision PT Short Term Goal 2 - Progress (Week 2): Met PT Short Term Goal 3 (Week 2): Pt will perform stair negotiation up/down 8 stairs with two rails and min A PT Short Term Goal 3 - Progress (Week 2): Met PT Short Term Goal 4 (Week 2): Pt will perform gait with LRAD in controlled environment x 150' with consistent min A PT Short  Term Goal 4 - Progress (Week 2): Met PT Short Term Goal 5 (Week 2): Pt will perform car transfer with supervision PT Short Term Goal 5 - Progress (Week 2): Progressing toward goal Week 3:  PT Short Term Goal 1 (Week 3): =LTG due to D/C on 11/02/15  Skilled Therapeutic Interventions/Progress Updates:    Pt received in w/c with mother present.  Pt agreeable to vestibular evaluation.  Pt performed sit > stand from w/c with supervision and performed gait with SPC in controlled environment x 150' x 2 over tile and carpet with min A overall with verbal cues for upright posture and for full foot clearance, step and stride length.  After vestibular assessment pt returned to room and performed sit > supine on hospital bed with supervision and left with all items within reach.     1. Vestibular Assessment                           Gross neck ROM WFL  Eye Alignment WFL  Oculomotor ROM WFL  Spontaneous  Nystagmus (room light and vision occluded) None seen in room light  Gaze holding nystagmus(room light and vision occluded) Direction changing in room light  Smooth pursuit Saccadic intrusions  Saccades Decreased speed; overshooting  Vergence Absent  VOR Cancellation Impaired  Pressure Tests (vision occluded) N/T  VOR slow Impaired  Head Thrust Test Inconsistent  Head Shaking Test (vision occluded) N/T  Dynamic Visual Acuity         N/T  Rt.  Hallpike Dix Negative  Lt. Hallpike Dix Negative  Rt. Roll Test Negative  Lt. Roll Test  Negative  MSQ Most provocative movements: 5 Head pitches 2/10, Standing 180 deg turn to L 2/10 dizziness  Cover-Cross Cover (if indicated) N/T  Head-Neck Differentiation Test (if indicated) N/T   2. Findings:  Patient signs and symptoms consistent with motion sensitivity, central vertigo, and impaired visual-vestibular interactions.  3. Recommendations for Treatment:  a. Adaptation for Visual-Vestibular interactions and Gaze Stabilization: VOR x 1 viewing in sitting x 30  seconds each horizontal and vertical head turns with target 3 ft away against blank background; Head-Eye shifts to focus on saccades + head movements; Convergence with pt focusing on near/far targets in functional setting in various visual fields. b. Continue Balance and Gait training with incorporation of head turns, pivots/changes in direction and visual input (Dynavision, visual scanning of environment and reading signs, focus on moving targets (ball toss or pass during gait, etc.)   Therapy Documentation Precautions:  Precautions Precautions: Fall Precaution Comments: expressive/ receptive difficulties Restrictions Weight Bearing Restrictions: No Vital Signs: Therapy Vitals Temp: 98.1 F (36.7 C) Temp Source: Oral Pulse Rate: 93 Resp: 18 BP: (!) 121/93 mmHg Patient Position (if appropriate): Sitting Oxygen Therapy SpO2: 100 % O2 Device: Not Delivered Pain: Pain Assessment Pain Assessment: No/denies pain  See Function Navigator for Current Functional Status.  Therapy/Group: Individual Therapy  Raylene Everts [facility] 10/29/2015, 5:09 PM

## 2015-10-29 NOTE — Progress Notes (Signed)
ANTICOAGULATION CONSULT NOTE - Follow Up Consult  Pharmacy Consult for Coumadin Indication: pulmonary embolus, DVT and stroke  Allergies  Allergen Reactions  . Codeine Other (See Comments)    Pt unknown  . Codeine Rash and Other (See Comments)    Unknown reaction (patient says it was more serious than just a rash, but he can't remember what happened)    Patient Measurements: Height: 5' 11"  (180.3 cm) Weight: 198 lb 6.6 oz (90 kg) IBW/kg (Calculated) : 75.3   Vital Signs: Temp: 98.1 F (36.7 C) (02/20 0519) Temp Source: Oral (02/20 0519) BP: 117/74 mmHg (02/20 0519) Pulse Rate: 86 (02/20 0519)  Labs:  Recent Labs  10/27/15 0517 10/28/15 0549 10/28/15 1750 10/29/15 0752  HGB 10.5* 11.5*  --  11.6*  HCT 31.2* 34.2*  --  34.0*  PLT 478* 488*  --  512*  LABPROT 16.1* 17.1*  --  20.1*  INR 1.28 1.38  --  1.71*  HEPARINUNFRC 0.45 <0.10* <0.10* 0.47    Estimated Creatinine Clearance: 17 mL/min (by C-G formula based on Cr of 5.48).  Assessment: 49yo on with recent CVA on heparin bridge to Coumadin for recurrent PE/DVT (on lovenox PTA).  INR trending up 1.71 tidat. Heparin level = 0.47, therapeutic on 1600 units/hr. Hg and pltc stable.  No bleeding noted.   Goal of Therapy:  Heparin level 0.3-0.5 units/ml INR 2-2.5 Monitor platelets by anticoagulation protocol: Yes   Plan:  Coumadin 7.50m po today Continue Heparin drip 1600 uts/hr Daily INR, HL, CBC  MMaryanna Shape PharmD, BCPS  Clinical Pharmacist  Pager: [phone]  10/29/2015 2:01 PM

## 2015-10-30 ENCOUNTER — Inpatient Hospital Stay (HOSPITAL_COMMUNITY): Payer: BLUE CROSS/BLUE SHIELD | Admitting: Speech Pathology

## 2015-10-30 ENCOUNTER — Inpatient Hospital Stay (HOSPITAL_COMMUNITY): Payer: BLUE CROSS/BLUE SHIELD | Admitting: Physical Therapy

## 2015-10-30 ENCOUNTER — Inpatient Hospital Stay (HOSPITAL_COMMUNITY): Payer: Self-pay | Admitting: Occupational Therapy

## 2015-10-30 ENCOUNTER — Inpatient Hospital Stay (HOSPITAL_COMMUNITY): Payer: BLUE CROSS/BLUE SHIELD

## 2015-10-30 LAB — CBC
HCT: 32 % — ABNORMAL LOW (ref 39.0–52.0)
HCT: 30.6 % — ABNORMAL LOW (ref 39.0–52.0)
Hemoglobin: 11.1 g/dL — ABNORMAL LOW (ref 13.0–17.0)
Hemoglobin: 10.5 g/dL — ABNORMAL LOW (ref 13.0–17.0)
MCH: 29.1 pg (ref 26.0–34.0)
MCH: 28.7 pg (ref 26.0–34.0)
MCHC: 34.7 g/dL (ref 30.0–36.0)
MCHC: 34.3 g/dL (ref 30.0–36.0)
MCV: 83.8 fL (ref 78.0–100.0)
MCV: 83.6 fL (ref 78.0–100.0)
Platelets: 506 10*3/uL — ABNORMAL HIGH (ref 150–400)
Platelets: 495 10*3/uL — ABNORMAL HIGH (ref 150–400)
RBC: 3.82 MIL/uL — ABNORMAL LOW (ref 4.22–5.81)
RBC: 3.66 MIL/uL — ABNORMAL LOW (ref 4.22–5.81)
RDW: 17.5 % — ABNORMAL HIGH (ref 11.5–15.5)
RDW: 17.5 % — ABNORMAL HIGH (ref 11.5–15.5)
WBC: 19.1 10*3/uL — ABNORMAL HIGH (ref 4.0–10.5)
WBC: 18.3 10*3/uL — ABNORMAL HIGH (ref 4.0–10.5)

## 2015-10-30 LAB — PROTIME-INR
INR: 1.97 — ABNORMAL HIGH (ref 0.00–1.49)
Prothrombin Time: 22.3 seconds — ABNORMAL HIGH (ref 11.6–15.2)

## 2015-10-30 LAB — RENAL FUNCTION PANEL
Albumin: 2.9 g/dL — ABNORMAL LOW (ref 3.5–5.0)
Anion gap: 15 (ref 5–15)
BUN: 25 mg/dL — ABNORMAL HIGH (ref 6–20)
CO2: 23 mmol/L (ref 22–32)
Calcium: 9 mg/dL (ref 8.9–10.3)
Chloride: 99 mmol/L — ABNORMAL LOW (ref 101–111)
Creatinine, Ser: 6.3 mg/dL — ABNORMAL HIGH (ref 0.61–1.24)
GFR calc Af Amer: 11 mL/min — ABNORMAL LOW (ref >60)
GFR calc non Af Amer: 9 mL/min — ABNORMAL LOW (ref >60)
Glucose, Bld: 227 mg/dL — ABNORMAL HIGH (ref 65–99)
Phosphorus: 4.4 mg/dL (ref 2.5–4.6)
Potassium: 3.4 mmol/L — ABNORMAL LOW (ref 3.5–5.1)
Sodium: 137 mmol/L (ref 135–145)

## 2015-10-30 LAB — GLUCOSE, CAPILLARY
Glucose-Capillary: 120 mg/dL — ABNORMAL HIGH (ref 65–99)
Glucose-Capillary: 206 mg/dL — ABNORMAL HIGH (ref 65–99)
Glucose-Capillary: 126 mg/dL — ABNORMAL HIGH (ref 65–99)

## 2015-10-30 LAB — HEPARIN LEVEL (UNFRACTIONATED): Heparin Unfractionated: 0.59 IU/mL (ref 0.30–0.70)

## 2015-10-30 MED ORDER — DARBEPOETIN ALFA 100 MCG/0.5ML IJ SOSY
PREFILLED_SYRINGE | INTRAMUSCULAR | Status: AC
  Filled 2015-10-30: qty 0.5

## 2015-10-30 MED ORDER — HEPARIN SODIUM (PORCINE) 1000 UNIT/ML DIALYSIS: 20.0000 [IU]/kg | INTRAMUSCULAR | Status: DC | PRN

## 2015-10-30 MED ORDER — WARFARIN SODIUM 7.5 MG PO TABS
7.5000 mg | ORAL_TABLET | Freq: Once | ORAL | Status: AC
  Administered 2015-10-30: 7.5 mg via ORAL
  Filled 2015-10-30: qty 1

## 2015-10-30 NOTE — Progress Notes (Signed)
ANTICOAGULATION CONSULT NOTE - Follow Up Consult  Pharmacy Consult for Coumadin Indication: pulmonary embolus, DVT and stroke  Allergies  Allergen Reactions  . Codeine Other (See Comments)    Pt unknown  . Codeine Rash and Other (See Comments)    Unknown reaction (patient says it was more serious than just a rash, but he can't remember what happened)    Patient Measurements: Height: 5' 11"  (180.3 cm) Weight: 198 lb 6.6 oz (90 kg) IBW/kg (Calculated) : 75.3   Vital Signs: Temp: 98.3 F (36.8 C) (02/21 0530) Temp Source: Oral (02/21 0530) BP: 119/79 mmHg (02/21 0530) Pulse Rate: 64 (02/21 0530)  Labs:  Recent Labs  10/28/15 0549 10/28/15 1750 10/29/15 0752 10/30/15 0603  HGB 11.5*  --  11.6* 11.1*  HCT 34.2*  --  34.0* 32.0*  PLT 488*  --  512* 506*  LABPROT 17.1*  --  20.1* 22.3*  INR 1.38  --  1.71* 1.97*  HEPARINUNFRC <0.10* <0.10* 0.47 0.59    Estimated Creatinine Clearance: 17 mL/min (by C-G formula based on Cr of 5.48).  Assessment: 49yo on with recent CVA on heparin bridge to Coumadin for recurrent PE/DVT (on lovenox PTA).  INR trending up 1.97 today. Heparin level = 0.59, slightly supratherapeutic on 1600 units/hr. Hg and pltc stable.  No bleeding noted.   Goal of Therapy:  Heparin level 0.3-0.5 units/ml INR 2-2.5 Monitor platelets by anticoagulation protocol: Yes   Plan:  Coumadin 7.63m po today D/c heparin Daily INR  MMaryanna Shape PharmD, BCPS  Clinical Pharmacist  Pager: [phone]  10/30/2015 1:49 PM

## 2015-10-30 NOTE — Progress Notes (Addendum)
Speech Language Pathology Daily Session Note  Patient Details  Name: Isaiah [name] [facility] MRN: [id number] Date of Birth: 14-Oct-1965  Today's Date: 10/30/2015 SLP Individual Time: 4098-1191 SLP Individual Time Calculation (min): 61 min (1 minute make up)  Short Term Goals: Week 3: SLP Short Term Goal 1 (Week 3): Pt will consume dys 3 textures and thin liquids with minimal overt s/s of aspiration and mod I use of swallowing precautions over 2 targeted sessions prior to advancement.   SLP Short Term Goal 2 (Week 3): Pt will self-monitor and correct verbal perseveration with min assist multimodal cues. SLP Short Term Goal 3 (Week 3): Pt will utilize over articulation for production of phonemes at the CV, CVC phrase level for accurate production of vowels and bilabials with Min verbal, visual cues  SLP Short Term Goal 4 (Week 3): Pt will verbalize at the sentence level with min assist verbal cues for 75% accuracy SLP Short Term Goal 5 (Week 3): Pt will utilize compensatory word finding strategies during basic verbal tasks to faciltiate functional communication with min assist verbal cues.    Skilled Therapeutic Interventions:  Pt was seen for skilled ST targeting communication goals.  SLP facilitated the session with a verbal description task targeting word finding strategies and articulation.  Pt required min assist verbal cues for semi-complex word finding and phrase/sentence formulation.  Pt also required min assist verbal cues to monitor and correct verbal errors.  Pt verbalized frustration with caregivers "not giving enough time" to allow pt to adequately convey his needs and wants.  SLP provided education regarding ways to advocate for himself, including establishing a functional phrase to use with caregivers to indicate that he requires extra time for communication.   Pt was returned to room and left in wheelchair with call bell left within reach.    Function:  Eating Eating   Modified Consistency  Diet: Yes Eating Assist Level: Help with picking up utensils;Helper brings food to mouth (Using R UE)   Eating Set Up Assist For: Opening containers   Helper Brings Food to Mouth: Every scoop   Cognition Comprehension Comprehension assist level: Understands complex 90% of the time/cues 10% of the time  Expression   Expression assist level: Expresses basic 75 - 89% of the time/requires cueing 10 - 24% of the time. Needs helper to occlude trach/needs to repeat words.  Social Interaction Social Interaction assist level: Interacts appropriately 90% of the time - Needs monitoring or encouragement for participation or interaction.  Problem Solving Problem solving assist level: Solves basic 75 - 89% of the time/requires cueing 10 - 24% of the time  Memory Memory assist level: Recognizes or recalls 75 - 89% of the time/requires cueing 10 - 24% of the time    Pain Pain Assessment Pain Assessment: No/denies pain Pain Score: 0-No pain  Therapy/Group: Individual Therapy  Kiren Mcisaac, Selinda Orion 10/30/2015, 12:25 PM

## 2015-10-30 NOTE — Progress Notes (Signed)
Occupational Therapy Session Note  Patient Details  Name: Isaiah [name] MRN: [id number] Date of Birth: [birth year]  Today's Date: 10/30/2015 OT Individual Time: 0354-6568 OT Individual Time Calculation (min): 60 min    Short Term Goals: Week 3:  OT Short Term Goal 1 (Week 3): Continue working toward estabilished supervision level goals.  Skilled Therapeutic Interventions/Progress Updates:    Pt seen for OT session focusing on ADL re-training. Pt in supine upon arrival, agreeable to tx session. Pt transferred to EOB from flat bed with min A and VCs for technique. He ambulated throughout room using [facility] and completed toileting task with close supervision- CGA and assist to manage IV pole. He ate breakfast seated EOB with hand over hand assist required to use R UE in functional manner to assist with self feeding with grasping utensils and elbow flexion to bring to mouth. Pt noted to have tremors in B hands when completing fine motor tasks. Bathing/dressing completed in w/c at sink. Steadying assist required for standing pericare/buttock hygiene as well as LB dressing. Pt left sitting up in w/c at end of session, all needs in reach.   Therapy Documentation Precautions:  Precautions Precautions: Fall Precaution Comments: expressive/ receptive difficulties Restrictions Weight Bearing Restrictions: No Pain: Pain Assessment Pain Assessment: No/denies pain Pain Score: 0-No pain  See Function Navigator for Current Functional Status.   Therapy/Group: Individual Therapy  Lewis, Marjo Grosvenor C 10/30/2015, 7:10 AM

## 2015-10-30 NOTE — Progress Notes (Signed)
Assessment:  1. New ESRD this hosp-  via [facility] TTS in hosp 2. Embolic CVA/warfarin 3. Anemia on aranesp- hgb over 11 4. Sec HPTH, PTH 211 5. Hypotension improved, off midodrine 6. Hx MGUS  Plan: 1. HD TTS while on Rehab- will plan for HD today- will check phos so we can treat- no vit D for PTH of 211 2. Plan to go to [facility] after DC. MWF 2nd shift. (Will need HD Friday before discharge) 3. AVF later when cleared by neuro  Subjective: Interval History:   Objective: Vital signs in last 24 hours: Temp:  [98.1 F (36.7 C)-98.3 F (36.8 C)] 98.3 F (36.8 C) (02/21 0530) Pulse Rate:  [64-93] 64 (02/21 0530) Resp:  [18] 18 (02/21 0530) BP: (119-121)/(79-93) 119/79 mmHg (02/21 0530) SpO2:  [96 %-100 %] 99 % (02/21 0926) Weight change:   Intake/Output from previous day: 02/20 0701 - 02/21 0700 In: 480 [P.O.:480] Out: -  Intake/Output this shift: Total I/O In: -  Out: 200 [Urine:200]  General appearance: alert and cooperative Resp: clear to auscultation bilaterally Chest wall: no tenderness, RIJ TDC Extremities: extremities normal, atraumatic, no cyanosis or edema Neurologic: Speech is dysarthric, left hemiparesis   Lab Results:  Recent Labs  10/29/15 0752 10/30/15 0603  WBC 17.6* 19.1*  HGB 11.6* 11.1*  HCT 34.0* 32.0*  PLT 512* 506*   BMET: No results for input(s): NA, K, CL, CO2, GLUCOSE, BUN, CREATININE, CALCIUM in the last 72 hours. No results for input(s): PTH in the last 72 hours. Iron Studies: No results for input(s): IRON, TIBC, TRANSFERRIN, FERRITIN in the last 72 hours. Studies/Results: No results found.  Scheduled: . antiseptic oral rinse  7 mL Mouth Rinse BID  . aspirin EC  81 mg Oral Daily  . darbepoetin (ARANESP) injection - DIALYSIS  100 mcg Intravenous Q Tue-HD  . fluticasone furoate-vilanterol  1 puff Inhalation Daily  . insulin aspart  0-5 Units Subcutaneous QHS  . insulin aspart  0-9 Units Subcutaneous TID WC  . insulin detemir  20 Units  Subcutaneous QHS  . magic mouthwash w/lidocaine  5 mL Oral QID  . midodrine  2.5 mg Oral BID WC  . montelukast  10 mg Oral QHS  . multivitamin  1 tablet Oral QHS  . pantoprazole  40 mg Oral BID  . Warfarin - Pharmacist Dosing Inpatient   Does not apply q1800      LOS: 18 days   Isaiah [name] A 10/30/2015,12:51 PM

## 2015-10-30 NOTE — Progress Notes (Signed)
I was asked by Dr. Letta Pate to comment on Isaiah [name]'s leukocytosis. His white cell count is 19,000 and have been fluctuating between 12,000 and 20,000 in the last 5 years. Etiology is likely reactive and less likely infectious process. Chronic inflammatory conditions can contribute to his chronic leukocytosis.  I do not think there is a lymphoproliferative process or myeloproliferative process contributing. He had 2 bone marrow biopsies which did not show any hematological disorder.  I recommended monitoring and at this time I see no need for further evaluation or intervention.

## 2015-10-30 NOTE — Progress Notes (Signed)
Physical Therapy Session Note  Patient Details  Name: Akshith Moncus [name] MRN: [id number] Date of Birth: 02/15/1966  Today's Date: 10/30/2015 PT Individual Time: 1500-1530 PT Individual Time Calculation (min): 30 min   Short Term Goals: Week 3:  PT Short Term Goal 1 (Week 3): =LTG due to D/C on 11/02/15  Skilled Therapeutic Interventions/Progress Updates:   Session focused on gait training with SPC on unit with focus on safety and balance recovery at close supervision level, family education for simulated car transfer to SUV and low sedan height with pt performing both at supervision level using SPC for balance, and dynamic gait training with SPC to challenge balance and simulate home/community navigating turns and stepping over simulated thresholds with close supervision to intermittent steady assist for balance.  Therapy Documentation Precautions:  Precautions Precautions: Fall Precaution Comments: expressive/ receptive difficulties Restrictions Weight Bearing Restrictions: No  See Function Navigator for Current Functional Status.   Therapy/Group: Individual Therapy  Canary Brim Ivory Broad, PT, DPT  10/30/2015, 4:03 PM

## 2015-10-30 NOTE — Progress Notes (Signed)
Physical Therapy Session Note  Patient Details  Name: Isaiah [name] MRN: [id number] Date of Birth: [birth year]  Today's Date: 10/30/2015 PT Individual Time: 1100-1200 PT Individual Time Calculation (min): 60 min    Skilled Therapeutic Interventions/Progress Updates:    Session focused on functional transfers with [facility] at supervision level for cues and safety, gait with SPC on unit and in home environment at supervision to steady assist level for balance and cues for R foot clearance, stair negotiation training for home entry using R rail and cues for technique, bed mobility and transfer to tall bed height, and furniture transfers using [place] from low couch in ADL apartment (supervision to couch but heavy min assist for sit to stand from low surface). Pt overall supervision to steady assist with mobility with occasional LOB requiring steady assist to recover especially when turning to the L - able to self correct with cues to slow down and focus. Pt requires extra time for all mobility. Pt's mom arrived towards end of session and reviewed d/c planning and equipment needs at d/c.   Therapy Documentation Precautions:  Precautions Precautions: Fall Precaution Comments: expressive/ receptive difficulties Restrictions Weight Bearing Restrictions: No   Pain: Pain Assessment Pain Assessment: No/denies pain Pain Score: 0-No pain   See Function Navigator for Current Functional Status.   Therapy/Group: Individual Therapy  Canary Brim Ivory Broad, PT, DPT  10/30/2015, 12:04 PM

## 2015-10-30 NOTE — Procedures (Signed)
Patient was seen on dialysis and the procedure was supervised.  BFR 350  Via PC BP is  122/81.   Patient appears to be tolerating treatment well  Isaiah [name] A 10/30/2015

## 2015-10-30 NOTE — Progress Notes (Signed)
Subjective/Complaints: Patient trying to eat with right hand. He is using a built-up spoon. He is working with occupational therapy. Having difficulty scooping up grits with the spoon  ROS- appears to deny CP, SOB, N/V/D  Objective: Vital Signs: Blood pressure 119/79, pulse 64, temperature 98.3 F (36.8 C), temperature source Oral, resp. rate 18, height 5' 11"  (1.803 m), weight 90 kg (198 lb 6.6 oz), SpO2 96 %. No results found. Results for orders placed or performed during the hospital encounter of 10/12/15 (from the past 72 hour(s))  Glucose, capillary     Status: Abnormal   Collection Time: 10/27/15 11:27 AM  Result Value Ref Range   Glucose-Capillary 162 (H) 65 - 99 mg/dL  Glucose, capillary     Status: Abnormal   Collection Time: 10/27/15  4:26 PM  Result Value Ref Range   Glucose-Capillary 197 (H) 65 - 99 mg/dL  Glucose, capillary     Status: Abnormal   Collection Time: 10/27/15  8:39 PM  Result Value Ref Range   Glucose-Capillary 187 (H) 65 - 99 mg/dL   Comment 1 Notify RN   Heparin level (unfractionated)     Status: Abnormal   Collection Time: 10/28/15  5:49 AM  Result Value Ref Range   Heparin Unfractionated <0.10 (L) 0.30 - 0.70 IU/mL    Comment:        IF HEPARIN RESULTS ARE BELOW EXPECTED VALUES, AND PATIENT DOSAGE HAS BEEN CONFIRMED, SUGGEST FOLLOW UP TESTING OF ANTITHROMBIN III LEVELS.   CBC     Status: Abnormal   Collection Time: 10/28/15  5:49 AM  Result Value Ref Range   WBC 19.7 (H) 4.0 - 10.5 K/uL   RBC 4.03 (L) 4.22 - 5.81 MIL/uL   Hemoglobin 11.5 (L) 13.0 - 17.0 g/dL   HCT 34.2 (L) 39.0 - 52.0 %   MCV 84.9 78.0 - 100.0 fL   MCH 28.5 26.0 - 34.0 pg   MCHC 33.6 30.0 - 36.0 g/dL   RDW 17.6 (H) 11.5 - 15.5 %   Platelets 488 (H) 150 - 400 K/uL  Protime-INR     Status: Abnormal   Collection Time: 10/28/15  5:49 AM  Result Value Ref Range   Prothrombin Time 17.1 (H) 11.6 - 15.2 seconds   INR 1.38 0.00 - 1.49  Glucose, capillary     Status: Abnormal   Collection Time: 10/28/15  6:34 AM  Result Value Ref Range   Glucose-Capillary 147 (H) 65 - 99 mg/dL   Comment 1 Notify RN   Glucose, capillary     Status: Abnormal   Collection Time: 10/28/15 11:55 AM  Result Value Ref Range   Glucose-Capillary 179 (H) 65 - 99 mg/dL  Glucose, capillary     Status: Abnormal   Collection Time: 10/28/15  4:53 PM  Result Value Ref Range   Glucose-Capillary 130 (H) 65 - 99 mg/dL  Heparin level (unfractionated)     Status: Abnormal   Collection Time: 10/28/15  5:50 PM  Result Value Ref Range   Heparin Unfractionated <0.10 (L) 0.30 - 0.70 IU/mL    Comment:        IF HEPARIN RESULTS ARE BELOW EXPECTED VALUES, AND PATIENT DOSAGE HAS BEEN CONFIRMED, SUGGEST FOLLOW UP TESTING OF ANTITHROMBIN III LEVELS.   Glucose, capillary     Status: Abnormal   Collection Time: 10/28/15  9:27 PM  Result Value Ref Range   Glucose-Capillary 235 (H) 65 - 99 mg/dL  Glucose, capillary     Status: Abnormal  Collection Time: 10/29/15  6:56 AM  Result Value Ref Range   Glucose-Capillary 130 (H) 65 - 99 mg/dL  Heparin level (unfractionated)     Status: None   Collection Time: 10/29/15  7:52 AM  Result Value Ref Range   Heparin Unfractionated 0.47 0.30 - 0.70 IU/mL    Comment:        IF HEPARIN RESULTS ARE BELOW EXPECTED VALUES, AND PATIENT DOSAGE HAS BEEN CONFIRMED, SUGGEST FOLLOW UP TESTING OF ANTITHROMBIN III LEVELS.   CBC     Status: Abnormal   Collection Time: 10/29/15  7:52 AM  Result Value Ref Range   WBC 17.6 (H) 4.0 - 10.5 K/uL   RBC 3.97 (L) 4.22 - 5.81 MIL/uL   Hemoglobin 11.6 (L) 13.0 - 17.0 g/dL   HCT 34.0 (L) 39.0 - 52.0 %   MCV 85.6 78.0 - 100.0 fL   MCH 29.2 26.0 - 34.0 pg   MCHC 34.1 30.0 - 36.0 g/dL   RDW 17.7 (H) 11.5 - 15.5 %   Platelets 512 (H) 150 - 400 K/uL  Protime-INR     Status: Abnormal   Collection Time: 10/29/15  7:52 AM  Result Value Ref Range   Prothrombin Time 20.1 (H) 11.6 - 15.2 seconds   INR 1.71 (H) 0.00 - 1.49  Glucose,  capillary     Status: Abnormal   Collection Time: 10/29/15 11:41 AM  Result Value Ref Range   Glucose-Capillary 217 (H) 65 - 99 mg/dL  Glucose, capillary     Status: Abnormal   Collection Time: 10/29/15  4:54 PM  Result Value Ref Range   Glucose-Capillary 148 (H) 65 - 99 mg/dL  Glucose, capillary     Status: Abnormal   Collection Time: 10/29/15  9:27 PM  Result Value Ref Range   Glucose-Capillary 154 (H) 65 - 99 mg/dL  Heparin level (unfractionated)     Status: None   Collection Time: 10/30/15  6:03 AM  Result Value Ref Range   Heparin Unfractionated 0.59 0.30 - 0.70 IU/mL    Comment:        IF HEPARIN RESULTS ARE BELOW EXPECTED VALUES, AND PATIENT DOSAGE HAS BEEN CONFIRMED, SUGGEST FOLLOW UP TESTING OF ANTITHROMBIN III LEVELS.   CBC     Status: Abnormal   Collection Time: 10/30/15  6:03 AM  Result Value Ref Range   WBC 19.1 (H) 4.0 - 10.5 K/uL   RBC 3.82 (L) 4.22 - 5.81 MIL/uL   Hemoglobin 11.1 (L) 13.0 - 17.0 g/dL   HCT 32.0 (L) 39.0 - 52.0 %   MCV 83.8 78.0 - 100.0 fL   MCH 29.1 26.0 - 34.0 pg   MCHC 34.7 30.0 - 36.0 g/dL   RDW 17.5 (H) 11.5 - 15.5 %   Platelets 506 (H) 150 - 400 K/uL  Protime-INR     Status: Abnormal   Collection Time: 10/30/15  6:03 AM  Result Value Ref Range   Prothrombin Time 22.3 (H) 11.6 - 15.2 seconds   INR 1.97 (H) 0.00 - 1.49  Glucose, capillary     Status: Abnormal   Collection Time: 10/30/15  6:47 AM  Result Value Ref Range   Glucose-Capillary 120 (H) 65 - 99 mg/dL    General: No acute distress. Vital signs reviewed Psych: Mood and affect are appropriate Heart: Regular rate and rhythm  Lungs: Clear to auscultation, breathing unlabored, no rales or wheezes Abdomen: Positive bowel sounds, soft nontender to palpation, nondistended Skin: No evidence of breakdown, no evidence of rash  Neurologic: short phrases, yes/no communication Motor  Right upper extremity 3/5 proximal and 3 minus/5 distal, poor fine motor with finger to thumb  opposition. Right lower extremity 4/5 hip flexor and extensor ankle dorsi flexor 5/5 in the left upper and left lower extremity. MAS:2 in the right biceps Musculoskeletal: No TTP. No joint swelling. No edema  Assessment/Plan: 1. 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 Physiatrist is providing close team supervision and 24 hour management of active medical problems listed below. Physiatrist and rehab team continue to assess barriers to discharge/monitor patient progress toward functional and medical goals. FIM: Function - Bathing Position: Wheelchair/chair at sink Body parts bathed by patient: Right arm, Chest, Abdomen, Front perineal area, Buttocks, Right upper leg, Left upper leg Body parts bathed by helper: Back Bathing not applicable: Right lower leg, Left lower leg (Pt did not attempt secondary to preference) Assist Level: Touching or steadying assistance(Pt > 75%)  Function- Upper Body Dressing/Undressing What is the patient wearing?: Pull over shirt/dress Pull over shirt/dress - Perfomed by patient: Put head through opening, Pull shirt over trunk, Thread/unthread right sleeve, Thread/unthread left sleeve Pull over shirt/dress - Perfomed by helper: Thread/unthread left sleeve Assist Level: Set up Set up : To obtain clothing/put away Function - Lower Body Dressing/Undressing What is the patient wearing?: Pants Position: Wheelchair/chair at sink Pants- Performed by patient: Thread/unthread right pants leg, Thread/unthread left pants leg, Pull pants up/down Pants- Performed by helper: Pull pants up/down Non-skid slipper socks- Performed by helper: Don/doff right sock, Don/doff left sock Socks - Performed by patient: Don/doff right sock, Don/doff left sock Shoes - Performed by patient: Don/doff right shoe,  Don/doff left shoe Shoes - Performed by helper: Don/doff right shoe, Don/doff left shoe Assist for footwear: Dependant Assist for lower body dressing: Touching or steadying assistance (Pt > 75%)  Function - Toileting Toileting steps completed by patient: Performs perineal hygiene (Pt did not have LB clothing on.) Toileting steps completed by helper: Adjust clothing prior to toileting Toileting Assistive Devices: Grab bar or rail Assist level: Touching or steadying assistance (Pt.75%)  Function - Air cabin crew transfer assistive device: Cane Assist level to toilet: Touching or steadying assistance (Pt > 75%) Assist level from toilet: Touching or steadying assistance (Pt > 75%)  Function - Chair/bed transfer Chair/bed transfer method: Ambulatory Chair/bed transfer assist level: Supervision or verbal cues Chair/bed transfer assistive device: Cane Chair/bed transfer details: Verbal cues for technique  Function - Locomotion: Wheelchair Will patient use wheelchair at discharge?: No Type: Manual Max wheelchair distance: 150 Assist Level: Supervision or verbal cues Wheel 50 feet with 2 turns activity did not occur: Safety/medical concerns Assist Level: Supervision or verbal cues Wheel 150 feet activity did not occur: Safety/medical concerns Assist Level: Supervision or verbal cues Turns around,maneuvers to table,bed, and toilet,negotiates 3% grade,maneuvers on rugs and over doorsills: No Function - Locomotion: Ambulation Ambulation activity did not occur: Safety/medical concerns Assistive device: Cane-straight Max distance: 150 Assist level: Touching or steadying assistance (Pt > 75%) Assist level: Touching or steadying assistance (Pt > 75%) Assist level: Touching or steadying assistance (Pt > 75%) Walk 150 feet activity did not occur: Safety/medical concerns Assist level: Touching or steadying assistance (Pt > 75%) Walk 10 feet on uneven surfaces activity did not occur:  Safety/medical concerns  Function - Comprehension Comprehension: Auditory Comprehension assist level: Understands complex 90% of the time/cues 10%  of the time  Function - Expression Expression: Verbal Expression assist level: Expresses basic 75 - 89% of the time/requires cueing 10 - 24% of the time. Needs helper to occlude trach/needs to repeat words.  Function - Social Interaction Social Interaction assist level: Interacts appropriately 90% of the time - Needs monitoring or encouragement for participation or interaction.  Function - Problem Solving Problem solving assist level: Solves basic 75 - 89% of the time/requires cueing 10 - 24% of the time  Function - Memory Memory assist level: Recognizes or recalls 75 - 89% of the time/requires cueing 10 - 24% of the time Patient normally able to recall (first 3 days only): Staff names and faces, That he or she is in a hospital  Medical Problem List and Plan: 1. Right hemiparesis and aphasia secondary to bi-cerebral embolic infarcts (L>R)-.carotid ultrasounds demonstrated 1-39% stenosis bilateral ICA no other abnormalities noted  2. PE/DVT/Stoke/Anticoagulation: Pharmaceutical: Lovenox bid per records from [place]. using iv heparin gtt at this time. per hematologist Dr Alen Blew and neuro resume warfarin-goal INR 2.0-2.5, 2/21 INR 1.97, should be therapeutic by 2/24 d/c 3. Pain Management: Ultram prn.  4. Mood: LCSW to follow for evaluation and support.  5. Neuropsych: This patient is not fully capable of making decisions on his own behalf. 6. Skin/Wound Care: Routine pressure relief measures.  7. Fluids/Electrolytes/Nutrition: Renal restrictions. 1200 cc/FR.  8. DM type 2: Monitor BS ac/hs. Continue levemir and use SSI for elevated BS.   Fasting CBG 120 today  continue current dose Levemir  -improved control   9. HTN: Monitor BP bid and titrate medications as needed. Elevated at admission. Parameters on Midodrine.  10. ESRD on  HD: per [facility]. Continue diet/volume mgt, Tunnel dialysis catheter Placed.  11. Leukocytosis: afebrile no acute infx suspected - will get Hematology input   12.  Hx of GI bleed will monitor for reurrence,Hgb stable at 10.6    -on daily CBC given warfarin and hep  13. Dysphagia- no sign of aspiration on D3 thin liquids  LOS (Days) 18 A FACE TO FACE EVALUATION WAS PERFORMED  KIRSTEINS,ANDREW E 10/30/2015, 8:27 AM

## 2015-10-30 NOTE — Progress Notes (Signed)
Social Work Patient ID: Isaiah [name], male   DOB: [birth year], 50 y.o.   MRN: [id number]   CSW set up family conference for pt's wife with medical team and therapy team for tomorrow at [place] will arrange DME and HH.  Pt's family to receive family education on Thursday morning before pt goes to dialysis.  CSW will continue to follow and assist as needed.

## 2015-10-31 ENCOUNTER — Inpatient Hospital Stay (HOSPITAL_COMMUNITY): Payer: Self-pay | Admitting: Occupational Therapy

## 2015-10-31 ENCOUNTER — Inpatient Hospital Stay (HOSPITAL_COMMUNITY): Payer: BLUE CROSS/BLUE SHIELD | Admitting: Speech Pathology

## 2015-10-31 ENCOUNTER — Inpatient Hospital Stay (HOSPITAL_COMMUNITY): Payer: BLUE CROSS/BLUE SHIELD | Admitting: Physical Therapy

## 2015-10-31 DIAGNOSIS — I6319 Cerebral infarction due to embolism of other precerebral artery: Secondary | ICD-10-CM

## 2015-10-31 LAB — GLUCOSE, CAPILLARY
Glucose-Capillary: 134 mg/dL — ABNORMAL HIGH (ref 65–99)
Glucose-Capillary: 249 mg/dL — ABNORMAL HIGH (ref 65–99)
Glucose-Capillary: 235 mg/dL — ABNORMAL HIGH (ref 65–99)
Glucose-Capillary: 134 mg/dL — ABNORMAL HIGH (ref 65–99)

## 2015-10-31 LAB — CBC
HCT: 34.7 % — ABNORMAL LOW (ref 39.0–52.0)
Hemoglobin: 12.1 g/dL — ABNORMAL LOW (ref 13.0–17.0)
MCH: 29.7 pg (ref 26.0–34.0)
MCHC: 34.9 g/dL (ref 30.0–36.0)
MCV: 85 fL (ref 78.0–100.0)
Platelets: 518 10*3/uL — ABNORMAL HIGH (ref 150–400)
RBC: 4.08 MIL/uL — ABNORMAL LOW (ref 4.22–5.81)
RDW: 17.6 % — ABNORMAL HIGH (ref 11.5–15.5)
WBC: 20 10*3/uL — ABNORMAL HIGH (ref 4.0–10.5)

## 2015-10-31 LAB — PROTIME-INR
INR: 1.89 — ABNORMAL HIGH (ref 0.00–1.49)
Prothrombin Time: 21.7 seconds — ABNORMAL HIGH (ref 11.6–15.2)

## 2015-10-31 MED ORDER — WARFARIN SODIUM 5 MG PO TABS
10.0000 mg | ORAL_TABLET | Freq: Once | ORAL | Status: AC
Start: 2015-10-31 — End: 2015-10-31
  Administered 2015-10-31: 10 mg via ORAL
  Filled 2015-10-31: qty 2

## 2015-10-31 MED ORDER — INSULIN DETEMIR 100 UNIT/ML ~~LOC~~ SOLN
25.0000 [IU] | Freq: Every day | SUBCUTANEOUS | Status: DC
  Administered 2015-10-31 – 2015-11-01 (×2): 25 [IU] via SUBCUTANEOUS
  Filled 2015-10-31 (×4): qty 0.25

## 2015-10-31 NOTE — Progress Notes (Signed)
Speech Language Pathology Daily Session Note  Patient Details  Name: Isaiah [name] MRN: [id number] Date of Birth: 28-Aug-1966  Today's Date: 10/31/2015 SLP Individual Time: 7680-8811- [id number] SLP Individual Time Calculation (min): 45 min, 45 min  Short Term Goals: Week 3: SLP Short Term Goal 1 (Week 3): Pt will consume dys 3 textures and thin liquids with minimal overt s/s of aspiration and mod I use of swallowing precautions over 2 targeted sessions prior to advancement.   SLP Short Term Goal 2 (Week 3): Pt will self-monitor and correct verbal perseveration with min assist multimodal cues. SLP Short Term Goal 3 (Week 3): Pt will utilize over articulation for production of phonemes at the CV, CVC phrase level for accurate production of vowels and bilabials with Min verbal, visual cues  SLP Short Term Goal 4 (Week 3): Pt will verbalize at the sentence level with min assist verbal cues for 75% accuracy SLP Short Term Goal 5 (Week 3): Pt will utilize compensatory word finding strategies during basic verbal tasks to faciltiate functional communication with min assist verbal cues.    Skilled Therapeutic Interventions:  Session 1: Pt was seen for skilled ST targeting goals for dysphagia.  SLP facilitated the session with a trial meal tray of regular textures to continue working towards solids progression.  Pt demonstrated x1 immediate cough on a grape but consecutive trials of fresh fruit were tolerated well without overt s/s of aspiration.  SLP reviewed and reinforced swallowing precautions, including slow rate, small bites and sips, and sitting upright or out of bed for meals.  Pt verbalized understanding of precautions.  Pt was returned to bed and left with bed alarm set and call bell left within reach.   Session 2:  Pt was seen for skilled ST targeting communication goals.  SLP administered portions of the Western Aphasia Battery to measure progress from initial evaluation.  Pt scored an 8/10 for  information content and a 6/10 for fluency grammatical competence and paraphasias.  Pt was able to answer 54/60 yes/no questions, 55/60 for auditory word recognition, 49/80 for following sequential commands, 90/100 for repetition (breakdown in phrases and sentences), 59/60 for naming and word finding.  Scores are consistent with clinical presentation with impairments most notable in grammatical structures during spontaneous speech.  Recommend completing evaluation at next available appointment following family education.  Pt was left in wheelchair with call bell within reach.  Continue per current plan of care.     Function:  Eating Eating                 Cognition Comprehension Comprehension assist level: Understands complex 90% of the time/cues 10% of the time  Expression   Expression assist level: Expresses basic 75 - 89% of the time/requires cueing 10 - 24% of the time. Needs helper to occlude trach/needs to repeat words.  Social Interaction Social Interaction assist level: Interacts appropriately 90% of the time - Needs monitoring or encouragement for participation or interaction.  Problem Solving Problem solving assist level: Solves basic 75 - 89% of the time/requires cueing 10 - 24% of the time  Memory Memory assist level: Recognizes or recalls 75 - 89% of the time/requires cueing 10 - 24% of the time    Pain Pain Assessment Pain Assessment: No/denies pain  Therapy/Group: Individual Therapy  Tyler Robidoux, Selinda Orion 10/31/2015, 3:53 PM

## 2015-10-31 NOTE — Progress Notes (Signed)
ANTICOAGULATION CONSULT NOTE - Follow Up Consult  Pharmacy Consult for Coumadin Indication: pulmonary embolus, DVT and stroke  Allergies  Allergen Reactions  . Codeine Other (See Comments)    Pt unknown  . Codeine Rash and Other (See Comments)    Unknown reaction (patient says it was more serious than just a rash, but he can't remember what happened)    Patient Measurements: Height: 5' 11"  (180.3 cm) Weight: 197 lb 8.5 oz (89.6 kg) IBW/kg (Calculated) : 75.3   Vital Signs: Temp: 97.8 F (36.6 C) (02/22 0541) Temp Source: Oral (02/22 0541) BP: 112/78 mmHg (02/22 0541) Pulse Rate: 99 (02/22 0541)  Labs:  Recent Labs  10/28/15 1750  10/29/15 0752 10/30/15 0603 10/30/15 1600 10/31/15 0627  HGB  --   < > 11.6* 11.1* 10.5* 12.1*  HCT  --   < > 34.0* 32.0* 30.6* 34.7*  PLT  --   < > 512* 506* 495* 518*  LABPROT  --   --  20.1* 22.3*  --  21.7*  INR  --   --  1.71* 1.97*  --  1.89*  HEPARINUNFRC <0.10*  --  0.47 0.59  --   --   CREATININE  --   --   --   --  6.30*  --   < > = values in this interval not displayed.  Estimated Creatinine Clearance: 14.8 mL/min (by C-G formula based on Cr of 6.3).  Assessment: 49yo on with recent CVA on Coumadin for recurrent PE/DVT.  INR trending up 1.89 today. Hg and pltc stable.  No bleeding noted.   Goal of Therapy:  Heparin level 0.3-0.5 units/ml INR 2-2.5 Monitor platelets by anticoagulation protocol: Yes   Plan:  Coumadin 10 mg po today Daily INR  Maryanna Shape, PharmD, BCPS  Clinical Pharmacist  Pager: [phone]   10/31/2015 10:25 AM

## 2015-10-31 NOTE — Progress Notes (Signed)
Physical Therapy Session Note  Patient Details  Name: Isaiah [name] MRN: [id number] Date of Birth: 1965/09/20  Today's Date: 10/31/2015 PT Individual Time: 1400-1445 PT Individual Time Calculation (min): 45 min   Short Term Goals: Week 3:  PT Short Term Goal 1 (Week 3): =LTG due to D/C on 11/02/15  Skilled Therapeutic Interventions/Progress Updates:   Patient in wheelchair with mother present for session. Gait training using SPC x 200 ft + 50 ft + 150 ft with supervision overall except one LOB requiring min A to recover with LOB when turning to L after exiting room and max verbal cues for R foot clearance. Seated edge of mat, instructed in Vestibular HEP with handout: Gaze Stabilization - VOR x 1 viewing in sitting x 30 seconds each horizontal and vertical head turns with target 3 ft away against blank background x 3 trials each condition and Head-Eye shifts to focus on saccades + head movements. Patient's ability to complete exercises correctly improved with practice. Patient demonstrates fall risk as noted by score of  45/56 on Berg Balance Scale, however improved from 26/56 last week. Stair training up/down 4 (6") stairs using R rail ascending with step-to pattern and cues for sequencing with supervision. Patient left semi reclined in bed with needs within reach and bed alarm on.    Therapy Documentation Precautions:  Precautions Precautions: Fall Precaution Comments: expressive/ receptive difficulties Restrictions Weight Bearing Restrictions: No Pain: Pain Assessment Pain Assessment: No/denies pain Pain Score: 0-No pain Balance: Balance Balance Assessed: Yes Standardized Balance Assessment Standardized Balance Assessment: Berg Balance Test Berg Balance Test Sit to Stand: Able to stand  independently using hands Standing Unsupported: Able to stand safely 2 minutes Sitting with Back Unsupported but Feet Supported on Floor or Stool: Able to sit safely and securely 2 minutes Stand  to Sit: Sits safely with minimal use of hands Transfers: Able to transfer safely, definite need of hands Standing Unsupported with Eyes Closed: Able to stand 10 seconds safely Standing Ubsupported with Feet Together: Able to place feet together independently and stand 1 minute safely From Standing, Reach Forward with Outstretched Arm: Can reach forward >12 cm safely (5") From Standing Position, Pick up Object from Floor: Able to pick up shoe, needs supervision From Standing Position, Turn to Look Behind Over each Shoulder: Looks behind one side only/other side shows less weight shift Turn 360 Degrees: Able to turn 360 degrees safely but slowly Standing Unsupported, Alternately Place Feet on Step/Stool: Able to stand independently and complete 8 steps >20 seconds Standing Unsupported, One Foot in Front: Able to plae foot ahead of the other independently and hold 30 seconds Standing on One Leg: Able to lift leg independently and hold equal to or more than 3 seconds Total Score: 45 Static Standing Balance Static Standing - Balance Support: No upper extremity supported Static Standing - Level of Assistance: 6: Modified independent (Device/Increase time) Dynamic Standing Balance Dynamic Standing - Balance Support: During functional activity;Left upper extremity supported Dynamic Standing - Level of Assistance: 5: Stand by assistance  See Function Navigator for Current Functional Status.   Therapy/Group: Individual Therapy  Laretta Alstrom 10/31/2015, 2:56 PM

## 2015-10-31 NOTE — Progress Notes (Signed)
Assessment:  1. New ESRD this hosp-  via [facility] TTS in hosp- also with spot at [name] TTS second shift  2. 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 Anemia on aranesp- hgb over 11 4. Sec HPTH, PTH 211- phos in the 4's - no binders or vit D needed  5. Hypotension improved, on midodrine- cramped with HD on Tuesday- to be less aggressive tomorrow  6. Hx MGUS  Plan: 1. HD TTS while on Rehab-  HD yest - pre labs with BUN of 25 and crt of 6- will plan next HD for Thursday but also will need short HD on Friday so he can make it to Tuesday as OP ( they wont start new pts on Saturday as a rule)  2. Plan to go to [name] after DC- first tx there Tuesday.  3. AVF later when cleared by neuro  Subjective: Interval History: seen in gym with PT- said he cramped with HD last night- removed 3 liters   Objective: Vital signs in last 24 hours: Temp:  [97 F (36.1 C)-97.8 F (36.6 C)] 97.8 F (36.6 C) (02/22 0541) Pulse Rate:  [81-104] 99 (02/22 0541) Resp:  [18-23] 18 (02/22 0541) BP: (86-137)/(48-83) 112/78 mmHg (02/22 0541) SpO2:  [98 %-99 %] 98 % (02/22 0541) Weight:  [88.4 kg (194 lb 14.2 oz)-91.4 kg (201 lb 8 oz)] 89.6 kg (197 lb 8.5 oz) (02/22 0500) Weight change:   Intake/Output from previous day: 02/21 0701 - 02/22 0700 In: 360 [P.O.:360] Out: 3000  Intake/Output this shift:    General appearance: alert and cooperative Resp: clear to auscultation bilaterally Chest wall: no tenderness, RIJ TDC Extremities: extremities normal, atraumatic, no cyanosis or edema Neurologic: Speech is dysarthric, left hemiparesis   Lab Results:  Recent Labs  10/30/15 1600 10/31/15 0627  WBC 18.3* 20.0*  HGB 10.5* 12.1*  HCT 30.6* 34.7*  PLT 495* 518*   BMET:   Recent Labs  10/30/15 1600  NA 137  K 3.4*  CL 99*  CO2 23  GLUCOSE 227*  BUN 25*  CREATININE 6.30*  CALCIUM 9.0   No results for input(s): PTH in the last 72 hours. Iron Studies: No results for input(s): IRON, TIBC, TRANSFERRIN, FERRITIN in  the last 72 hours. Studies/Results: No results found.  Scheduled: . antiseptic oral rinse  7 mL Mouth Rinse BID  . aspirin EC  81 mg Oral Daily  . darbepoetin (ARANESP) injection - DIALYSIS  100 mcg Intravenous Q Tue-HD  . fluticasone furoate-vilanterol  1 puff Inhalation Daily  . insulin aspart  0-5 Units Subcutaneous QHS  . insulin aspart  0-9 Units Subcutaneous TID WC  . insulin detemir  25 Units Subcutaneous QHS  . magic mouthwash w/lidocaine  5 mL Oral QID  . midodrine  2.5 mg Oral BID WC  . montelukast  10 mg Oral QHS  . multivitamin  1 tablet Oral QHS  . pantoprazole  40 mg Oral BID  . warfarin  10 mg Oral ONCE-1800  . Warfarin - Pharmacist Dosing Inpatient   Does not apply q1800      LOS: 19 days   Teyonna Plaisted A 10/31/2015,11:51 AM

## 2015-10-31 NOTE — Progress Notes (Signed)
Subjective/Complaints: Feels okay this morning. Aware that wife is coming in today. No pain complaints. Does not note any blood in stools.  ROS- appears to deny CP, SOB, N/V/D  Objective: Vital Signs: Blood pressure 112/78, pulse 99, temperature 97.8 F (36.6 C), temperature source Oral, resp. rate 18, height 5' 11" (1.803 m), weight 89.6 kg (197 lb 8.5 oz), SpO2 98 %. No results found. Results for orders placed or performed during the hospital encounter of 10/12/15 (from the past 72 hour(s))  Glucose, capillary     Status: Abnormal   Collection Time: 10/28/15 11:55 AM  Result Value Ref Range   Glucose-Capillary 179 (H) 65 - 99 mg/dL  Glucose, capillary     Status: Abnormal   Collection Time: 10/28/15  4:53 PM  Result Value Ref Range   Glucose-Capillary 130 (H) 65 - 99 mg/dL  Heparin level (unfractionated)     Status: Abnormal   Collection Time: 10/28/15  5:50 PM  Result Value Ref Range   Heparin Unfractionated <0.10 (L) 0.30 - 0.70 IU/mL    Comment:        IF HEPARIN RESULTS ARE BELOW EXPECTED VALUES, AND PATIENT DOSAGE HAS BEEN CONFIRMED, SUGGEST FOLLOW UP TESTING OF ANTITHROMBIN III LEVELS.   Glucose, capillary     Status: Abnormal   Collection Time: 10/28/15  9:27 PM  Result Value Ref Range   Glucose-Capillary 235 (H) 65 - 99 mg/dL  Glucose, capillary     Status: Abnormal   Collection Time: 10/29/15  6:56 AM  Result Value Ref Range   Glucose-Capillary 130 (H) 65 - 99 mg/dL  Heparin level (unfractionated)     Status: None   Collection Time: 10/29/15  7:52 AM  Result Value Ref Range   Heparin Unfractionated 0.47 0.30 - 0.70 IU/mL    Comment:        IF HEPARIN RESULTS ARE BELOW EXPECTED VALUES, AND PATIENT DOSAGE HAS BEEN CONFIRMED, SUGGEST FOLLOW UP TESTING OF ANTITHROMBIN III LEVELS.   CBC     Status: Abnormal   Collection Time: 10/29/15  7:52 AM  Result Value Ref Range   WBC 17.6 (H) 4.0 - 10.5 K/uL   RBC 3.97 (L) 4.22 - 5.81 MIL/uL   Hemoglobin 11.6 (L) 13.0  - 17.0 g/dL   HCT 34.0 (L) 39.0 - 52.0 %   MCV 85.6 78.0 - 100.0 fL   MCH 29.2 26.0 - 34.0 pg   MCHC 34.1 30.0 - 36.0 g/dL   RDW 17.7 (H) 11.5 - 15.5 %   Platelets 512 (H) 150 - 400 K/uL  Protime-INR     Status: Abnormal   Collection Time: 10/29/15  7:52 AM  Result Value Ref Range   Prothrombin Time 20.1 (H) 11.6 - 15.2 seconds   INR 1.71 (H) 0.00 - 1.49  Glucose, capillary     Status: Abnormal   Collection Time: 10/29/15 11:41 AM  Result Value Ref Range   Glucose-Capillary 217 (H) 65 - 99 mg/dL  Glucose, capillary     Status: Abnormal   Collection Time: 10/29/15  4:54 PM  Result Value Ref Range   Glucose-Capillary 148 (H) 65 - 99 mg/dL  Glucose, capillary     Status: Abnormal   Collection Time: 10/29/15  9:27 PM  Result Value Ref Range   Glucose-Capillary 154 (H) 65 - 99 mg/dL  Heparin level (unfractionated)     Status: None   Collection Time: 10/30/15  6:03 AM  Result Value Ref Range   Heparin Unfractionated 0.59 0.30 -  0.70 IU/mL    Comment:        IF HEPARIN RESULTS ARE BELOW EXPECTED VALUES, AND PATIENT DOSAGE HAS BEEN CONFIRMED, SUGGEST FOLLOW UP TESTING OF ANTITHROMBIN III LEVELS.   CBC     Status: Abnormal   Collection Time: 10/30/15  6:03 AM  Result Value Ref Range   WBC 19.1 (H) 4.0 - 10.5 K/uL   RBC 3.82 (L) 4.22 - 5.81 MIL/uL   Hemoglobin 11.1 (L) 13.0 - 17.0 g/dL   HCT 32.0 (L) 39.0 - 52.0 %   MCV 83.8 78.0 - 100.0 fL   MCH 29.1 26.0 - 34.0 pg   MCHC 34.7 30.0 - 36.0 g/dL   RDW 17.5 (H) 11.5 - 15.5 %   Platelets 506 (H) 150 - 400 K/uL  Protime-INR     Status: Abnormal   Collection Time: 10/30/15  6:03 AM  Result Value Ref Range   Prothrombin Time 22.3 (H) 11.6 - 15.2 seconds   INR 1.97 (H) 0.00 - 1.49  Glucose, capillary     Status: Abnormal   Collection Time: 10/30/15  6:47 AM  Result Value Ref Range   Glucose-Capillary 120 (H) 65 - 99 mg/dL  Glucose, capillary     Status: Abnormal   Collection Time: 10/30/15 12:20 PM  Result Value Ref Range    Glucose-Capillary 206 (H) 65 - 99 mg/dL  CBC     Status: Abnormal   Collection Time: 10/30/15  4:00 PM  Result Value Ref Range   WBC 18.3 (H) 4.0 - 10.5 K/uL   RBC 3.66 (L) 4.22 - 5.81 MIL/uL   Hemoglobin 10.5 (L) 13.0 - 17.0 g/dL   HCT 30.6 (L) 39.0 - 52.0 %   MCV 83.6 78.0 - 100.0 fL   MCH 28.7 26.0 - 34.0 pg   MCHC 34.3 30.0 - 36.0 g/dL   RDW 17.5 (H) 11.5 - 15.5 %   Platelets 495 (H) 150 - 400 K/uL  Renal function panel     Status: Abnormal   Collection Time: 10/30/15  4:00 PM  Result Value Ref Range   Sodium 137 135 - 145 mmol/L   Potassium 3.4 (L) 3.5 - 5.1 mmol/L   Chloride 99 (L) 101 - 111 mmol/L   CO2 23 22 - 32 mmol/L   Glucose, Bld 227 (H) 65 - 99 mg/dL   BUN 25 (H) 6 - 20 mg/dL   Creatinine, Ser 6.30 (H) 0.61 - 1.24 mg/dL   Calcium 9.0 8.9 - 10.3 mg/dL   Phosphorus 4.4 2.5 - 4.6 mg/dL   Albumin 2.9 (L) 3.5 - 5.0 g/dL   GFR calc non Af Amer 9 (L) >60 mL/min   GFR calc Af Amer 11 (L) >60 mL/min    Comment: (NOTE) The eGFR has been calculated using the CKD EPI equation. This calculation has not been validated in all clinical situations. eGFR's persistently <60 mL/min signify possible Chronic Kidney Disease.    Anion gap 15 5 - 15  Glucose, capillary     Status: Abnormal   Collection Time: 10/30/15  8:33 PM  Result Value Ref Range   Glucose-Capillary 126 (H) 65 - 99 mg/dL  Protime-INR     Status: Abnormal   Collection Time: 10/31/15  6:27 AM  Result Value Ref Range   Prothrombin Time 21.7 (H) 11.6 - 15.2 seconds   INR 1.89 (H) 0.00 - 1.49  CBC     Status: Abnormal   Collection Time: 10/31/15  6:27 AM  Result Value Ref Range  WBC 20.0 (H) 4.0 - 10.5 K/uL   RBC 4.08 (L) 4.22 - 5.81 MIL/uL   Hemoglobin 12.1 (L) 13.0 - 17.0 g/dL   HCT 34.7 (L) 39.0 - 52.0 %   MCV 85.0 78.0 - 100.0 fL   MCH 29.7 26.0 - 34.0 pg   MCHC 34.9 30.0 - 36.0 g/dL   RDW 17.6 (H) 11.5 - 15.5 %   Platelets 518 (H) 150 - 400 K/uL  Glucose, capillary     Status: Abnormal   Collection  Time: 10/31/15  6:48 AM  Result Value Ref Range   Glucose-Capillary 134 (H) 65 - 99 mg/dL    General: No acute distress. Vital signs reviewed Psych: Mood and affect are appropriate Heart: Regular rate and rhythm  Lungs: Clear to auscultation, breathing unlabored, no rales or wheezes Abdomen: Positive bowel sounds, soft nontender to palpation, nondistended Skin: No evidence of breakdown, no evidence of rash Neurologic: short phrases, yes/no communication Motor  Right upper extremity 3/5 proximal and 3 minus/5 distal, poor fine motor with finger to thumb opposition. Right lower extremity 4/5 hip flexor and extensor ankle dorsi flexor 5/5 in the left upper and left lower extremity. MAS:2 in the right biceps Musculoskeletal: No TTP. No joint swelling. No edema  Assessment/Plan: 1. 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 Physiatrist is providing close team supervision and 24 hour management of active medical problems listed below. Physiatrist and rehab team continue to assess barriers to discharge/monitor patient progress toward functional and medical goals. FIM: Function - Bathing Position: Wheelchair/chair at sink Body parts bathed by patient: Right arm, Chest, Abdomen, Front perineal area, Buttocks, Right upper leg, Left upper leg, Left arm, Right lower leg, Left lower leg Body parts bathed by helper: Back Bathing not applicable: Right lower leg, Left lower leg (Pt did not attempt secondary to preference) Assist Level: Touching or steadying assistance(Pt > 75%)  Function- Upper Body Dressing/Undressing What is the patient wearing?: Pull over shirt/dress Pull over shirt/dress - Perfomed by patient: Put head through opening, Pull shirt over trunk, Thread/unthread right sleeve, Thread/unthread left sleeve Pull over  shirt/dress - Perfomed by helper: Thread/unthread left sleeve Assist Level: Set up Set up : To obtain clothing/put away Function - Lower Body Dressing/Undressing What is the patient wearing?: Pants Position: Wheelchair/chair at sink Pants- Performed by patient: Thread/unthread right pants leg, Thread/unthread left pants leg, Pull pants up/down Pants- Performed by helper: Pull pants up/down Non-skid slipper socks- Performed by helper: Don/doff right sock, Don/doff left sock Socks - Performed by patient: Don/doff right sock, Don/doff left sock Shoes - Performed by patient: Don/doff right shoe, Don/doff left shoe Shoes - Performed by helper: Don/doff right shoe, Don/doff left shoe Assist for footwear: Dependant Assist for lower body dressing: Touching or steadying assistance (Pt > 75%)  Function - Toileting Toileting steps completed by patient: Adjust clothing prior to toileting, Performs perineal hygiene, Adjust clothing after toileting Toileting steps completed by helper: Adjust clothing prior to toileting Toileting Assistive Devices: Grab bar or rail Assist level: Touching or steadying assistance (Pt.75%)  Function - Air cabin crew transfer assistive device: Cane Assist level to toilet: Touching or steadying assistance (Pt > 75%) Assist level from toilet: Touching or steadying assistance (Pt > 75%)  Function - Chair/bed transfer Chair/bed transfer method: Ambulatory Chair/bed transfer assist level: Supervision or verbal cues Chair/bed transfer assistive device: Cane Chair/bed transfer  details: Verbal cues for technique  Function - Locomotion: Wheelchair Will patient use wheelchair at discharge?: No Type: Manual Max wheelchair distance: 150 Assist Level: Supervision or verbal cues Wheel 50 feet with 2 turns activity did not occur: Safety/medical concerns Assist Level: Supervision or verbal cues Wheel 150 feet activity did not occur: Safety/medical concerns Assist  Level: Supervision or verbal cues Turns around,maneuvers to table,bed, and toilet,negotiates 3% grade,maneuvers on rugs and over doorsills: No Function - Locomotion: Ambulation Ambulation activity did not occur: Safety/medical concerns Assistive device: Cane-straight Max distance: 180 Assist level: Touching or steadying assistance (Pt > 75%) Assist level: Supervision or verbal cues Assist level: Touching or steadying assistance (Pt > 75%) Walk 150 feet activity did not occur: Safety/medical concerns Assist level: Touching or steadying assistance (Pt > 75%) Walk 10 feet on uneven surfaces activity did not occur: Safety/medical concerns  Function - Comprehension Comprehension: Auditory Comprehension assist level: Understands complex 90% of the time/cues 10% of the time  Function - Expression Expression: Verbal Expression assist level: Expresses basic 75 - 89% of the time/requires cueing 10 - 24% of the time. Needs helper to occlude trach/needs to repeat words.  Function - Social Interaction Social Interaction assist level: Interacts appropriately 90% of the time - Needs monitoring or encouragement for participation or interaction.  Function - Problem Solving Problem solving assist level: Solves basic 75 - 89% of the time/requires cueing 10 - 24% of the time  Function - Memory Memory assist level: Recognizes or recalls 75 - 89% of the time/requires cueing 10 - 24% of the time Patient normally able to recall (first 3 days only): Staff names and faces, That he or she is in a hospital  Medical Problem List and Plan: 1. Right hemiparesis and aphasia secondary to bi-cerebral embolic infarcts (L>R)-.carotid ultrasounds demonstrated 1-39% stenosis bilateral ICA no other abnormalities noted , Particularly embolic.Family conference today, Referred to social work note Team conference today please see physician documentation under team conference tab, met with team face-to-face to discuss  problems,progress, and goals. Formulized individual treatment plan based on medical history, underlying problem and comorbidities. 2. PE/DVT/Stoke/Anticoagulation: Pharmaceutical:Warfarin per pharmacy protocol INR 1.87 today 3. Pain Management: Ultram prn.  4. Mood: LCSW to follow for evaluation and support.  5. Neuropsych: This patient is not fully capable of making decisions on his own behalf. 6. Skin/Wound Care: Routine pressure relief measures.  7. Fluids/Electrolytes/Nutrition: Renal restrictions. 1200 cc/FR.  8. DM type 2: Monitor BS ac/hs. Continue levemir and use SSI for elevated BS.   Fasting CBG 120 today  continue current dose Levemir  -improved control   9. HTN: Monitor BP bid and titrate medications as needed. Elevated at admission. Parameters on Midodrine.  10. ESRD on HD: per [facility]. Continue diet/volume mgt, Tunnel dialysis catheter Placed.  11. Leukocytosis: afebrile no acute infx suspected -Per hematology this is reactive   12.  Hx of GI bleed will monitor for reurrence,Hgb Improved to 12.1 on Aranesp    -on daily CBC given warfarin and hep  13. Dysphagia- no sign of aspiration on D3 thin liquids  LOS (Days) 19 A FACE TO FACE EVALUATION WAS PERFORMED  KIRSTEINS,ANDREW E 10/31/2015, 9:55 AM

## 2015-10-31 NOTE — Progress Notes (Signed)
Occupational Therapy Session Note  Patient Details  Name: Isaiah [name] MRN: [id number] Date of Birth: [birth year]  Today's Date: 10/31/2015 OT Individual Time: 1100-1200 OT Individual Time Calculation (min): 60 min    Short Term Goals: Week 3:  OT Short Term Goal 1 (Week 3): Continue working toward estabilished supervision level goals.  Skilled Therapeutic Interventions/Progress Updates:   Pt worked on Geneticist, molecular during session in the therapy gym.  Began session with pt finishing working on grooming task in standing at the sink.  He was able to complete oral hygiene using the RUE with built up foam handle and mod assist.  He maintained standing balance during task with supervision as well.  Once finished pt ambulated to the therapy gym using the [facility] and min guard assist.  Worked on sitting on mat with RUE in weightbearing with transitions from sidelying to sit and from supine to sit to increased weightbearing and normal use of the RUE during functional task.  Integrated NMES as well for the right digit extensors as he demonstrates 90% of gross digit flexion at this time.  Utilized Open Exercise Mode with pt incorporating shoulder flexion and elbow extension along with the stimulation.  Progressed to grasp mode in order to reach for smaller cups, pick up, and place on bedside table.  Mod facilitation needed for shoulder and elbow flexion with reach when placing cups on bedside table above shoulder level.  Pt tolerated intervals of stimulation without any adverse reactions.  Stimulation total 15 mins during session.  Ambulated back to room at end of session with pt left in wheelchair with 1/2 lap tray in place and call button within reach.  Pt's mother also present as well and observed during session.    Therapy Documentation Precautions:  Precautions Precautions: Fall Precaution Comments: expressive/ receptive difficulties Restrictions Weight Bearing Restrictions:  No  Pain: Pain Assessment Pain Assessment: No/denies pain Pain Score: 0-No pain ADL: See Function Navigator for Current Functional Status.   Therapy/Group: Individual Therapy  Sheriann Newmann OTR/L 10/31/2015, 12:09 PM

## 2015-10-31 NOTE — Patient Care Conference (Signed)
Inpatient RehabilitationTeam Conference and Plan of Care Update Date: 10/31/2015   Time: 10:30 AM    Patient Name: Isaiah [name] [facility]      Medical Record Number: [id number]  Date of Birth: Jul 26, 1966 Sex: Male         Room/Bed: 4W05C/4W05C-01 Payor Info: Payor: BLUE CROSS BLUE SHIELD / Plan: BCBS OTHER / Product Type: *No Product type* /    Admitting Diagnosis: CVA   New ESRD Chronic kidney disease   Admit Date/Time:  10/12/2015  5:38 PM Admission Comments: No comment available   Primary Diagnosis:  Hemiparesis, aphasia, and dysphagia as late effects of cerebrovascular accident [facility]) Principal Problem: Hemiparesis, aphasia, and dysphagia as late effects of cerebrovascular accident [facility])  Patient Active Problem List   Diagnosis Date Noted  . CVA (cerebral infarction)   . ESRD (end stage renal disease) on dialysis ([place])   . S/P dialysis catheter insertion ([place])   . Acute deep vein thrombosis (DVT) of popliteal vein of left lower extremity ([place])   . Leukocytosis   . Labile blood glucose   . Acute blood loss anemia   . Hemiparesis, aphasia, and dysphagia as late effects of cerebrovascular accident ([place]) 10/13/2015  . Gait disturbance, post-stroke 10/13/2015  . Embolic stroke ([place]) 44/62/8638  . Plasma cell disorder   . Anemia 10/31/2014  . Long term current use of anticoagulant therapy 10/31/2014  . Renal insufficiency 10/31/2014  . Iron deficiency anemia due to chronic blood loss 10/30/2014  . Encounter for therapeutic drug monitoring 10/11/2014  . Pulmonary embolus ([place]) 09/27/2014  . ESRD on dialysis ([place]) 09/27/2014  . MGUS (monoclonal gammopathy of unknown significance) 09/27/2014  . OSA on CPAP 09/27/2014  . Diabetic retinopathy of both eyes ([place]) 02/03/2012  . Sickle cell trait ([place]) 07/19/2011  . Morbid obesity ([place]) 07/19/2011  . PE (pulmonary embolism) 07/18/2011  . SOB (shortness of breath) 07/18/2011  . Hyperkalemia 07/18/2011  . ARF (acute renal failure) ([place])  07/18/2011  . Leukocytosis, unspecified 07/18/2011  . DM (diabetes mellitus) type II controlled with renal manifestation ([place]) 07/18/2011  . Cardiac enzymes elevated 07/18/2011  . Metabolic acidosis 17/71/1657  . HTN (hypertension) 07/18/2011    Expected Discharge Date: Expected Discharge Date: 11/02/15  Team Members Present: Physician leading conference: Dr. Alysia Penna Social Worker Present: Ovidio Kin, LCSW Nurse Present: Heather Roberts, RN PT Present: Raylene Everts, PT OT Present: Clyda Greener, OT SLP Present: Windell Moulding, SLP PPS Coordinator present : Daiva Nakayama, RN, CRRN     Current Status/Progress Goal Weekly Team Focus  Medical   off IV heparin, Hgb stable, warfain near therapeutic level  Home d/c this week  D/C planning   Bowel/Bladder   Continent of bowel and bladder; occasional incontinence of bladder; LBM 2/17  Mod I  Assess and treat for constipation as neeed   Swallow/Nutrition/ Hydration   Advanced to Regular, thin liquids; intermittent supervision   mod I, upgraded   toleration of regular solids and thin liquids   ADL's   Supervision- Steadying assist A LB bathing/dressing; steadying A toileting. Decreased gross grasp/ coordination R UE  supervision to min assist level  d/c planning, family training, functional standing balance, neuro re-ed, functional ambulation, ADL re-training   Mobility   Supervision-min A overall with SPC  Mod I transfers, supervision gait and stair negotiation  Family education and D/C planning, gait, balance, vestibular treatment   Communication   can fluctuate between supervision and mod assist depending on whether tasks are highly structured or informal  Min-mod assist   continue to address verbal expression in sentences and conversations    Safety/Cognition/ Behavioral Observations  possible cognitive evaluation prior to discharge  TBD       Pain   No complaints of pain  < 4  Assess and treat for pain q shift and prn   Skin    No skin breakdown noted  Min assist  Assess skin q shift and prn      *See Care Plan and progress notes for long and short-term goals.  Barriers to Discharge: wife needs training    Possible Resolutions to Barriers:  caregiver training in am    Discharge Planning/Teaching Needs:   Pt to return to his home with his wife and mother to provide 24/7 supervision.  Pt's wife will receive education from the therapists on Thursday afternoon.      Team Discussion:  Family Conference this am with team. Reaching goals for discharge Friday. Wife to be in Thursday afternoon to go through therapies and for education to prepare for discharge Friday. Diet advanced to regular. Plan home health then transition to OP therapies.  Revisions to Treatment Plan:  None   Continued Need for Acute Rehabilitation Level of Care: The patient requires daily medical management by a physician with specialized training in physical medicine and rehabilitation for the following conditions: Daily direction of a multidisciplinary physical rehabilitation program to ensure safe treatment while eliciting the highest outcome that is of practical value to the patient.: Yes Daily medical management of patient stability for increased activity during participation in an intensive rehabilitation regime.: Yes Daily analysis of laboratory values and/or radiology reports with any subsequent need for medication adjustment of medical intervention for : Neurological problems  Dupree, Gardiner Rhyme 10/31/2015, 1:19 PM

## 2015-11-01 ENCOUNTER — Inpatient Hospital Stay (HOSPITAL_COMMUNITY): Payer: BLUE CROSS/BLUE SHIELD | Admitting: Speech Pathology

## 2015-11-01 ENCOUNTER — Inpatient Hospital Stay (HOSPITAL_COMMUNITY): Payer: BLUE CROSS/BLUE SHIELD | Admitting: Physical Therapy

## 2015-11-01 ENCOUNTER — Encounter (HOSPITAL_COMMUNITY): Payer: Self-pay | Admitting: Occupational Therapy

## 2015-11-01 ENCOUNTER — Telehealth: Payer: Self-pay | Admitting: Cardiovascular Disease

## 2015-11-01 LAB — RENAL FUNCTION PANEL
Albumin: 3.1 g/dL — ABNORMAL LOW (ref 3.5–5.0)
Anion gap: 14 (ref 5–15)
BUN: 28 mg/dL — ABNORMAL HIGH (ref 6–20)
CO2: 23 mmol/L (ref 22–32)
Calcium: 9.2 mg/dL (ref 8.9–10.3)
Chloride: 95 mmol/L — ABNORMAL LOW (ref 101–111)
Creatinine, Ser: 7.14 mg/dL — ABNORMAL HIGH (ref 0.61–1.24)
GFR calc Af Amer: 9 mL/min — ABNORMAL LOW (ref >60)
GFR calc non Af Amer: 8 mL/min — ABNORMAL LOW (ref >60)
Glucose, Bld: 205 mg/dL — ABNORMAL HIGH (ref 65–99)
Phosphorus: 4.8 mg/dL — ABNORMAL HIGH (ref 2.5–4.6)
Potassium: 3.9 mmol/L (ref 3.5–5.1)
Sodium: 132 mmol/L — ABNORMAL LOW (ref 135–145)

## 2015-11-01 LAB — GLUCOSE, CAPILLARY
Glucose-Capillary: 127 mg/dL — ABNORMAL HIGH (ref 65–99)
Glucose-Capillary: 181 mg/dL — ABNORMAL HIGH (ref 65–99)
Glucose-Capillary: 207 mg/dL — ABNORMAL HIGH (ref 65–99)
Glucose-Capillary: 75 mg/dL (ref 65–99)

## 2015-11-01 LAB — CBC
HCT: 32.7 % — ABNORMAL LOW (ref 39.0–52.0)
Hemoglobin: 11.4 g/dL — ABNORMAL LOW (ref 13.0–17.0)
MCH: 28.9 pg (ref 26.0–34.0)
MCHC: 34.9 g/dL (ref 30.0–36.0)
MCV: 83 fL (ref 78.0–100.0)
Platelets: 550 10*3/uL — ABNORMAL HIGH (ref 150–400)
RBC: 3.94 MIL/uL — ABNORMAL LOW (ref 4.22–5.81)
RDW: 17.3 % — ABNORMAL HIGH (ref 11.5–15.5)
WBC: 18.4 10*3/uL — ABNORMAL HIGH (ref 4.0–10.5)

## 2015-11-01 LAB — PROTIME-INR
INR: 2.45 — ABNORMAL HIGH (ref 0.00–1.49)
Prothrombin Time: 26.3 seconds — ABNORMAL HIGH (ref 11.6–15.2)

## 2015-11-01 MED ORDER — WARFARIN SODIUM 5 MG PO TABS: 5.0000 mg | ORAL_TABLET | Freq: Once | ORAL | Status: DC

## 2015-11-01 MED ORDER — MIDODRINE HCL 5 MG PO TABS
ORAL_TABLET | ORAL | Status: AC
  Filled 2015-11-01: qty 1

## 2015-11-01 MED ORDER — HEPARIN SODIUM (PORCINE) 1000 UNIT/ML DIALYSIS: 20.0000 [IU]/kg | INTRAMUSCULAR | Status: DC | PRN

## 2015-11-01 NOTE — Progress Notes (Signed)
Assessment:  1. New ESRD this hosp-  via [facility] TTS in hosp- also with spot at [facility] TTS second shift  2. 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 Anemia on aranesp- hgb over 11 4. Sec HPTH, PTH 211- phos in the 4's - no binders or vit D needed  5. Hypotension improved, on midodrine- cramped with HD on Tuesday- to be less aggressive today 6. Hx MGUS  Plan: 1. HD TTS while on Rehab-  HD Tuesday and planned for today- pre labs with BUN of 25 and crt of 6- will plan next HD today but also will need short HD on Friday so he can make it to Tuesday as OP ( they wont start new pts on Saturday as a rule)  2. Plan to go to [facility] after DC- first tx there Tuesday 2/28.  3. AVF later when cleared by neuro  Subjective: Interval History: no new complaints - excited to be going home tomorrow   Objective: Vital signs in last 24 hours: Temp:  [97.5 F (36.4 C)-97.8 F (36.6 C)] 97.8 F (36.6 C) (02/23 0456) Pulse Rate:  [95-100] 95 (02/23 0456) Resp:  [18-19] 18 (02/23 0456) BP: (112-117)/(74-82) 117/74 mmHg (02/23 0456) SpO2:  [96 %-99 %] 96 % (02/23 0914) Weight:  [88.724 kg (195 lb 9.6 oz)-88.9 kg (195 lb 15.8 oz)] 88.9 kg (195 lb 15.8 oz) (02/23 0500) Weight change: -2.677 kg (-5 lb 14.4 oz)  Intake/Output from previous day: 02/22 0701 - 02/23 0700 In: 360 [P.O.:360] Out: -  Intake/Output this shift: Total I/O In: 60 [P.O.:60] Out: -   General appearance: alert and cooperative Resp: clear to auscultation bilaterally Chest wall: no tenderness, RIJ TDC Extremities: extremities normal, atraumatic, no cyanosis or edema Neurologic: Speech is dysarthric, left hemiparesis   Lab Results:  Recent Labs  10/30/15 1600 10/31/15 0627  WBC 18.3* 20.0*  HGB 10.5* 12.1*  HCT 30.6* 34.7*  PLT 495* 518*   BMET:   Recent Labs  10/30/15 1600  NA 137  K 3.4*  CL 99*  CO2 23  GLUCOSE 227*  BUN 25*  CREATININE 6.30*  CALCIUM 9.0   No results for input(s): PTH in the last 72 hours. Iron  Studies: No results for input(s): IRON, TIBC, TRANSFERRIN, FERRITIN in the last 72 hours. Studies/Results: No results found.  Scheduled: . antiseptic oral rinse  7 mL Mouth Rinse BID  . aspirin EC  81 mg Oral Daily  . darbepoetin (ARANESP) injection - DIALYSIS  100 mcg Intravenous Q Tue-HD  . fluticasone furoate-vilanterol  1 puff Inhalation Daily  . insulin aspart  0-5 Units Subcutaneous QHS  . insulin aspart  0-9 Units Subcutaneous TID WC  . insulin detemir  25 Units Subcutaneous QHS  . magic mouthwash w/lidocaine  5 mL Oral QID  . midodrine  2.5 mg Oral BID WC  . montelukast  10 mg Oral QHS  . multivitamin  1 tablet Oral QHS  . pantoprazole  40 mg Oral BID  . warfarin  5 mg Oral ONCE-1800  . Warfarin - Pharmacist Dosing Inpatient   Does not apply q1800      LOS: 20 days   Isaiah [name] A 11/01/2015,11:40 AM

## 2015-11-01 NOTE — Progress Notes (Signed)
Occupational Therapy Discharge Summary  Patient Details  Name: Isaiah [name] [facility] MRN: [id number] Date of Birth: [birth year]  Today's Date: 11/01/2015 OT Individual Time: 1300-1400 OT Individual Time Calculation (min): 60 min    Session Note:  Pt worked on self feeding to begin session with supervision for use of the LUE.  He needed mod facilitation to self feed with use of a built up foam grip over the utensil and use of the RUE.   Pt's spouse present for family education and she was shown the correct technique for assisting him with this as well, and was able to assist him efficiently.  Pt completed bathing and dressing sit to stand at the sink next, with wife providing assist as well.  At end of session provided handout on RUE AROM and AAROM exercises for the RUE.    Patient has met 8 of 8 long term goals due to improved activity tolerance, improved balance, postural control, ability to compensate for deficits, functional use of  RIGHT upper and RIGHT lower extremity, improved attention, improved awareness and improved coordination.  Patient to discharge at overall Supervision level.  Patient's care partner is independent to provide the necessary physical and cognitive assistance at discharge.    Reasons goals not met: NA  Shower transfer goal discharged secondary to pt not being able to shower secondary to dialysis catheter  Recommendation:  Patient will benefit from ongoing skilled OT services in home health setting to continue to advance functional skills in the area of BADL and iADL.  Isaiah [name] has made great gains in OT this admission, however he still demonstrates decreased RUE functional use and coordination as well as decreased balance as it relates to selfcare performance.  Feel he will continue to benefit through [facility] to maximize progress.   Equipment: 3:1  Reasons for discharge: treatment goals met and discharge from hospital  Patient/family agrees with progress made and goals  achieved: Yes  OT Discharge Precautions/Restrictions  Precautions Precautions: Fall Precaution Comments: expressive/ receptive difficulties Restrictions Weight Bearing Restrictions: No  Pain Pain Assessment Pain Assessment: No/denies pain ADL  See Function Section of chart  Vision/Perception  Vision- History Baseline Vision/History: Wears glasses Wears Glasses: Reading only Patient Visual Report: Blurring of vision;Diplopia Vision- Assessment Eye Alignment: Within Functional Limits Alignment/Gaze Preference: Within Defined Limits Tracking/Visual Pursuits: Able to track stimulus in all quads without difficulty Diplopia Assessment:  (Pt still reporting intermittent diplopia)  Cognition Overall Cognitive Status: Impaired/Different from baseline Arousal/Alertness: Awake/alert Orientation Level: Oriented X4 Attention: Selective Sustained Attention: Appears intact Selective Attention: Appears intact Memory: Impaired Memory Impairment: Decreased recall of new information Awareness: Impaired Awareness Impairment: Emergent impairment;Anticipatory impairment Problem Solving: Impaired Problem Solving Impairment: Functional complex Safety/Judgment: Impaired Comments: follows multi unit commands Sensation Sensation Light Touch: Appears Intact Stereognosis: Impaired Detail Stereognosis Impaired Details: Impaired RUE Hot/Cold: Not tested Proprioception: Appears Intact Coordination Gross Motor Movements are Fluid and Coordinated: No Fine Motor Movements are Fluid and Coordinated: No Coordination and Movement Description: Pt still with Brunnstrum stage V movement in the RUE and stage IV in the hand.  Needs mod assist to incorporate the RUE into bathing and dressing tasks.  Demonstrated gross 90% digit flexion and 70% extension but still with decreased activation at the thumb. Motor  Motor Motor: Hemiplegia Motor - Discharge Observations: RUE and RLE weakness still present at  this time Mobility  Bed Mobility Bed Mobility: Rolling Right;Rolling Left;Sit to Supine;Supine to Sit Rolling Right: 6: Modified independent (Device/Increase time) Rolling Left: 6: Modified  independent (Device/Increase time) Supine to Sit: 6: Modified independent (Device/Increase time) Sit to Supine: 6: Modified independent (Device/Increase time) Transfers Transfers: Sit to Stand Sit to Stand: 5: Supervision;From chair/3-in-1 Sit to Stand Details: Verbal cues for technique Stand to Sit: 5: Supervision Stand to Sit Details (indicate cue type and reason): Verbal cues for technique  Trunk/Postural Assessment  Cervical Assessment Cervical Assessment: Exceptions to [facility] (forward head) Thoracic Assessment Thoracic Assessment: Exceptions to [facility] (kyphotic, rounded shoulders) Lumbar Assessment Lumbar Assessment: Exceptions to [facility] (posterior pelvic tilt) Postural Control Postural Control: Deficits on evaluation Protective Responses: delayed balance strategies  Balance Balance Balance Assessed: Yes Standardized Balance Assessment Standardized Balance Assessment: Berg Balance Test Berg Balance Test Sit to Stand: Able to stand  independently using hands Standing Unsupported: Able to stand safely 2 minutes Sitting with Back Unsupported but Feet Supported on Floor or Stool: Able to sit safely and securely 2 minutes Stand to Sit: Sits safely with minimal use of hands Transfers: Able to transfer safely, definite need of hands Standing Unsupported with Eyes Closed: Able to stand 10 seconds safely Standing Ubsupported with Feet Together: Able to place feet together independently and stand 1 minute safely From Standing, Reach Forward with Outstretched Arm: Can reach forward >12 cm safely (5") From Standing Position, Pick up Object from Floor: Able to pick up shoe, needs supervision From Standing Position, Turn to Look Behind Over each Shoulder: Looks behind one side only/other side shows less  weight shift Turn 360 Degrees: Able to turn 360 degrees safely but slowly Standing Unsupported, Alternately Place Feet on Step/Stool: Able to stand independently and complete 8 steps >20 seconds Standing Unsupported, One Foot in Front: Able to plae foot ahead of the other independently and hold 30 seconds Standing on One Leg: Able to lift leg independently and hold equal to or more than 3 seconds Total Score: 45 Dynamic Sitting Balance Dynamic Sitting - Balance Support: No upper extremity supported Dynamic Sitting - Level of Assistance: 6: Modified independent (Device/Increase time) Static Standing Balance Static Standing - Balance Support: No upper extremity supported Static Standing - Level of Assistance: 5: Stand by assistance Dynamic Standing Balance Dynamic Standing - Balance Support: During functional activity;Left upper extremity supported Dynamic Standing - Level of Assistance: 5: Stand by assistance Extremity/Trunk Assessment RUE Assessment RUE Assessment: Exceptions to [name] RUE Strength RUE Overall Strength Comments: Pt currently Brunnstrum stage IV movement in the right hand and stage V in the arm.  Needs mod assist for use as an active assist for bathing and dressing tasks.  Gross digit flexion = 90%, extension 70% approximately.   LUE Assessment LUE Assessment: Within Functional Limits   See Function Navigator for Current Functional Status.  Moriah Loughry OTR/L 11/01/2015, 4:22 PM

## 2015-11-01 NOTE — Telephone Encounter (Signed)
Dr.Kirstins   Isaiah [name]  is calling for a PFO for [place]. Epic notes listed from [place] on 10/24/14.  I called Triage and they stated to send message asking if PFO should be done by [facility]urt Knack or [employer]?  I asked Pam to put Referral in epic and she was having   issues she stated she will have Trish handle it.

## 2015-11-01 NOTE — Progress Notes (Signed)
Physical Therapy Discharge Summary  Patient Details  Name: Isaiah [name] MRN: [id number] Date of Birth: 1965-10-03  Today's Date: 11/01/2015 PT Individual Time: 1435-1530 PT Individual Time Calculation (min): 55 min    Patient has met 8 of 8 long term goals due to improved activity tolerance, improved balance, improved postural control, increased strength, increased range of motion, ability to compensate for deficits, functional use of  right upper extremity, improved attention, improved awareness and improved coordination.  Patient to discharge at an ambulatory level Supervision.   Patient's care partner is independent to provide the necessary physical and cognitive assistance at discharge.  Reasons goals not met: NA  Recommendation:  Patient will benefit from ongoing skilled PT services in home health setting to continue to advance safe functional mobility, address ongoing impairments in R hemiparesis, postural control, balance, balance strategies, activity tolerance, and therefore will continue to benefit from skilled PT intervention to enhance overall performance with balance, postural control, ability to compensate for deficits, functional use of right upper extremity and right lower extremity and coordination, and minimize fall risk.  Equipment: SPC  Reasons for discharge: treatment goals met and discharge from hospital  Patient/family agrees with progress made and goals achieved: Yes  Skilled Therapeutic Intervention Focus on family training with wife for ambulation using SPC in controlled and home environments with wife providing appropriate cues for posture and foot clearance, stair training up/down 12 (6") stairs using R rail ascending x 4 and 2 rails x 8 with cues for safe sequencing and RUE placement, simulated car transfer to sedan height using SPC, bed mobility on regular bed in ADL apartment, and picking up object from floor with wife providing appropriate supervision.  Reviewed and performed Vestibular HEP handout: Gaze Stabilization - VOR x 1 viewing in sitting x 30 seconds each horizontal and vertical head turns x 3 trials each condition and Head-Eye shifts to horizontal targets to focus on saccades + head movements. Patient unable to perform head-eye shifts exercises correctly and easily frustrated. Reviewed standing OTAGO HEP for balance and falls prevention with patient and wife but patient declined to practice exercises. Patient left in wheelchair with family to return to room. Patient and family with no further questions/concerns regarding discharge.    PT Discharge Precautions/Restrictions Precautions Precautions: Fall Restrictions Weight Bearing Restrictions: No Pain Pain Assessment Pain Assessment: No/denies pain Vision/Perception  Vision - Assessment Eye Alignment: Within Functional Limits Alignment/Gaze Preference: Within Defined Limits Tracking/Visual Pursuits: Able to track stimulus in all quads without difficulty Diplopia Assessment:  (Pt still reporting intermittent diplopia)  Cognition Overall Cognitive Status: Impaired/Different from baseline Arousal/Alertness: Awake/alert Orientation Level: Oriented X4 Attention: Selective Sustained Attention: Appears intact Selective Attention: Appears intact Memory: Impaired Memory Impairment: Decreased recall of new information Awareness: Impaired Awareness Impairment: Emergent impairment;Anticipatory impairment Problem Solving: Impaired Problem Solving Impairment: Functional complex Safety/Judgment: Impaired Comments: follows multi unit commands Sensation Sensation Light Touch: Appears Intact Stereognosis: Not tested Hot/Cold: Appears Intact Proprioception: Appears Intact Coordination Gross Motor Movements are Fluid and Coordinated: No Fine Motor Movements are Fluid and Coordinated: No Motor  Motor Motor: Hemiplegia;Abnormal postural alignment and control  Mobility Bed  Mobility Bed Mobility: Rolling Right;Rolling Left;Sit to Supine;Supine to Sit Rolling Right: 6: Modified independent (Device/Increase time) Rolling Left: 6: Modified independent (Device/Increase time) Supine to Sit: 6: Modified independent (Device/Increase time) Sit to Supine: 6: Modified independent (Device/Increase time) Transfers Transfers: Yes Sit to Stand: 6: Modified independent (Device/Increase time) Stand to Sit: 6: Modified independent (Device/Increase time) Locomotion  Ambulation  Ambulation: Yes Ambulation/Gait Assistance: 5: Supervision Ambulation Distance (Feet): 200 Feet Assistive device: Straight cane Gait Gait: Yes Gait Pattern: Within Functional Limits  Trunk/Postural Assessment  Cervical Assessment Cervical Assessment: Exceptions to [facility] (forward head) Thoracic Assessment Thoracic Assessment: Exceptions to [facility] (kyphotic, rounded shoulders) Lumbar Assessment Lumbar Assessment: Exceptions to [facility] (posterior pelvic tilt) Postural Control Postural Control: Deficits on evaluation Protective Responses: delayed balance strategies  Balance Balance Balance Assessed: Yes Standardized Balance Assessment Standardized Balance Assessment: Berg Balance Test Berg Balance Test Sit to Stand: Able to stand  independently using hands Standing Unsupported: Able to stand safely 2 minutes Sitting with Back Unsupported but Feet Supported on Floor or Stool: Able to sit safely and securely 2 minutes Stand to Sit: Sits safely with minimal use of hands Transfers: Able to transfer safely, definite need of hands Standing Unsupported with Eyes Closed: Able to stand 10 seconds safely Standing Ubsupported with Feet Together: Able to place feet together independently and stand 1 minute safely From Standing, Reach Forward with Outstretched Arm: Can reach forward >12 cm safely (5") From Standing Position, Pick up Object from Floor: Able to pick up shoe, needs supervision From Standing  Position, Turn to Look Behind Over each Shoulder: Looks behind one side only/other side shows less weight shift Turn 360 Degrees: Able to turn 360 degrees safely but slowly Standing Unsupported, Alternately Place Feet on Step/Stool: Able to stand independently and complete 8 steps >20 seconds Standing Unsupported, One Foot in Front: Able to plae foot ahead of the other independently and hold 30 seconds Standing on One Leg: Able to lift leg independently and hold equal to or more than 3 seconds Total Score: 45 Static Standing Balance Static Standing - Balance Support: No upper extremity supported Static Standing - Level of Assistance: 6: Modified independent (Device/Increase time) Dynamic Standing Balance Dynamic Standing - Balance Support: During functional activity;Left upper extremity supported Dynamic Standing - Level of Assistance: 5: Stand by assistance Extremity Assessment  RUE Assessment RUE Assessment: Exceptions to [facility] RUE Strength RUE Overall Strength Comments: Pt currently Brunnstrum stage IV movement in the right hand and stage V in the arm.  Needs mod assist for use as an active assist for bathing and dressing tasks.  Gross digit flexion = 90%, extension 70% approximately.   LUE Assessment LUE Assessment: Within Functional Limits RLE Assessment RLE Assessment: Within Functional Limits (5/5 except 4/5 hip flexion) LLE Assessment LLE Assessment: Within Functional Limits   See Function Navigator for Current Functional Status.  Laretta Alstrom 11/01/2015, 3:26 PM

## 2015-11-01 NOTE — Plan of Care (Signed)
Problem: RH Expression Communication Goal: LTG Patient will increase word finding of common (SLP) LTG: Patient will increase word finding of common objects/daily info/abstract thoughts with cues using compensatory strategies (SLP).  Outcome: Not Met (add Reason) Min assist needed for word finding   Problem: RH Awareness Goal: LTG: Patient will demonstrate intellectual/emergent (SLP) LTG: Patient will demonstrate emergent awareness of verbal errors during a structured cognitive/linguistic activity (SLP)  Outcome: Not Met (add Reason) Min assist for emergent awareness of verbal errors

## 2015-11-01 NOTE — Progress Notes (Signed)
Subjective/Complaints: Appreciate nephrology note, converting from Tuesday Thursday Saturday schedule to Monday Wednesday Friday schedule for hemodialysis. We'll get dialysis today and a short dialysis treatment tomorrow  ROS- appears to deny CP, SOB, N/V/D  Objective: Vital Signs: Blood pressure 117/74, pulse 95, temperature 97.8 F (36.6 C), temperature source Oral, resp. rate 18, height _0  (1.803 m), weight 88.9 kg (195 lb 15.8 oz), SpO2 99 %. No results found. Results for orders placed or performed during the hospital encounter of 10/12/15 (from the past 72 hour(s))  Glucose, capillary     Status: Abnormal   Collection Time: 10/29/15 11:41 AM  Result Value Ref Range   Glucose-Capillary 217 (H) 65 - 99 mg/dL  Glucose, capillary     Status: Abnormal   Collection Time: 10/29/15  4:54 PM  Result Value Ref Range   Glucose-Capillary 148 (H) 65 - 99 mg/dL  Glucose, capillary     Status: Abnormal   Collection Time: 10/29/15  9:27 PM  Result Value Ref Range   Glucose-Capillary 154 (H) 65 - 99 mg/dL  Heparin level (unfractionated)     Status: None   Collection Time: 10/30/15  6:03 AM  Result Value Ref Range   Heparin Unfractionated 0.59 0.30 - 0.70 IU/mL    Comment:        IF HEPARIN RESULTS ARE BELOW EXPECTED VALUES, AND PATIENT DOSAGE HAS BEEN CONFIRMED, SUGGEST FOLLOW UP TESTING OF ANTITHROMBIN III LEVELS.   CBC     Status: Abnormal   Collection Time: 10/30/15  6:03 AM  Result Value Ref Range   WBC 19.1 (H) 4.0 - 10.5 K/uL   RBC 3.82 (L) 4.22 - 5.81 MIL/uL   Hemoglobin 11.1 (L) 13.0 - 17.0 g/dL   HCT 32.0 (L) 39.0 - 52.0 %   MCV 83.8 78.0 - 100.0 fL   MCH 29.1 26.0 - 34.0 pg   MCHC 34.7 30.0 - 36.0 g/dL   RDW 17.5 (H) 11.5 - 15.5 %   Platelets 506 (H) 150 - 400 K/uL  Protime-INR     Status: Abnormal   Collection Time: 10/30/15  6:03 AM  Result Value Ref Range   Prothrombin Time 22.3 (H) 11.6 - 15.2 seconds   INR 1.97 (H) 0.00 - 1.49  Glucose, capillary     Status:  Abnormal   Collection Time: 10/30/15  6:47 AM  Result Value Ref Range   Glucose-Capillary 120 (H) 65 - 99 mg/dL  Glucose, capillary     Status: Abnormal   Collection Time: 10/30/15 12:20 PM  Result Value Ref Range   Glucose-Capillary 206 (H) 65 - 99 mg/dL  CBC     Status: Abnormal   Collection Time: 10/30/15  4:00 PM  Result Value Ref Range   WBC 18.3 (H) 4.0 - 10.5 K/uL   RBC 3.66 (L) 4.22 - 5.81 MIL/uL   Hemoglobin 10.5 (L) 13.0 - 17.0 g/dL   HCT 30.6 (L) 39.0 - 52.0 %   MCV 83.6 78.0 - 100.0 fL   MCH 28.7 26.0 - 34.0 pg   MCHC 34.3 30.0 - 36.0 g/dL   RDW 17.5 (H) 11.5 - 15.5 %   Platelets 495 (H) 150 - 400 K/uL  Renal function panel     Status: Abnormal   Collection Time: 10/30/15  4:00 PM  Result Value Ref Range   Sodium 137 135 - 145 mmol/L   Potassium 3.4 (L) 3.5 - 5.1 mmol/L   Chloride 99 (L) 101 - 111 mmol/L   CO2 23 22 -  32 mmol/L   Glucose, Bld 227 (H) 65 - 99 mg/dL   BUN 25 (H) 6 - 20 mg/dL   Creatinine, Ser 6.30 (H) 0.61 - 1.24 mg/dL   Calcium 9.0 8.9 - 10.3 mg/dL   Phosphorus 4.4 2.5 - 4.6 mg/dL   Albumin 2.9 (L) 3.5 - 5.0 g/dL   GFR calc non Af Amer 9 (L) >60 mL/min   GFR calc Af Amer 11 (L) >60 mL/min    Comment: (NOTE) The eGFR has been calculated using the CKD EPI equation. This calculation has not been validated in all clinical situations. eGFR's persistently <60 mL/min signify possible Chronic Kidney Disease.    Anion gap 15 5 - 15  Glucose, capillary     Status: Abnormal   Collection Time: 10/30/15  8:33 PM  Result Value Ref Range   Glucose-Capillary 126 (H) 65 - 99 mg/dL  Protime-INR     Status: Abnormal   Collection Time: 10/31/15  6:27 AM  Result Value Ref Range   Prothrombin Time 21.7 (H) 11.6 - 15.2 seconds   INR 1.89 (H) 0.00 - 1.49  CBC     Status: Abnormal   Collection Time: 10/31/15  6:27 AM  Result Value Ref Range   WBC 20.0 (H) 4.0 - 10.5 K/uL   RBC 4.08 (L) 4.22 - 5.81 MIL/uL   Hemoglobin 12.1 (L) 13.0 - 17.0 g/dL   HCT 34.7 (L)  39.0 - 52.0 %   MCV 85.0 78.0 - 100.0 fL   MCH 29.7 26.0 - 34.0 pg   MCHC 34.9 30.0 - 36.0 g/dL   RDW 17.6 (H) 11.5 - 15.5 %   Platelets 518 (H) 150 - 400 K/uL  Glucose, capillary     Status: Abnormal   Collection Time: 10/31/15  6:48 AM  Result Value Ref Range   Glucose-Capillary 134 (H) 65 - 99 mg/dL  Glucose, capillary     Status: Abnormal   Collection Time: 10/31/15 12:02 PM  Result Value Ref Range   Glucose-Capillary 249 (H) 65 - 99 mg/dL  Glucose, capillary     Status: Abnormal   Collection Time: 10/31/15  4:44 PM  Result Value Ref Range   Glucose-Capillary 235 (H) 65 - 99 mg/dL  Glucose, capillary     Status: Abnormal   Collection Time: 10/31/15  8:33 PM  Result Value Ref Range   Glucose-Capillary 134 (H) 65 - 99 mg/dL  Protime-INR     Status: Abnormal   Collection Time: 11/01/15  5:19 AM  Result Value Ref Range   Prothrombin Time 26.3 (H) 11.6 - 15.2 seconds   INR 2.45 (H) 0.00 - 1.49  Glucose, capillary     Status: Abnormal   Collection Time: 11/01/15  6:33 AM  Result Value Ref Range   Glucose-Capillary 127 (H) 65 - 99 mg/dL    General: No acute distress. Vital signs reviewed Psych: Mood and affect are appropriate Heart: Regular rate and rhythm  Lungs: Clear to auscultation, breathing unlabored, no rales or wheezes Abdomen: Positive bowel sounds, soft nontender to palpation, nondistended Skin: No evidence of breakdown, no evidence of rash Neurologic: short phrases, yes/no communication Motor  Right upper extremity 3/5 proximal and 3 minus/5 distal, poor fine motor with finger to thumb opposition. Right lower extremity 4/5 hip flexor and extensor ankle dorsi flexor 5/5 in the left upper and left lower extremity. MAS:2 in the right biceps Musculoskeletal: No TTP. No joint swelling. No edema  Assessment/Plan: 1. Functional deficits secondary to Right hemiparesis and  aphasia secondary to bi-cerebral embolic infarcts (L>R) which require 3+ hours per day of  interdisciplinary therapy in a comprehensive inpatient rehab setting. Physiatrist is providing close team supervision and 24 hour management of active medical problems listed below. Physiatrist and rehab team continue to assess barriers to discharge/monitor patient progress toward functional and medical goals. FIM: Function - Bathing Position: Wheelchair/chair at sink Body parts bathed by patient: Right arm, Chest, Abdomen, Front perineal area, Buttocks, Right upper leg, Left upper leg, Left arm, Right lower leg, Left lower leg Body parts bathed by helper: Back Bathing not applicable: Right lower leg, Left lower leg (Pt did not attempt secondary to preference) Assist Level: Touching or steadying assistance(Pt > 75%)  Function- Upper Body Dressing/Undressing What is the patient wearing?: Pull over shirt/dress Pull over shirt/dress - Perfomed by patient: Put head through opening, Pull shirt over trunk, Thread/unthread right sleeve, Thread/unthread left sleeve Pull over shirt/dress - Perfomed by helper: Thread/unthread left sleeve Assist Level: Set up Set up : To obtain clothing/put away Function - Lower Body Dressing/Undressing What is the patient wearing?: Pants Position: Wheelchair/chair at sink Pants- Performed by patient: Thread/unthread right pants leg, Thread/unthread left pants leg, Pull pants up/down Pants- Performed by helper: Pull pants up/down Non-skid slipper socks- Performed by helper: Don/doff right sock, Don/doff left sock Socks - Performed by patient: Don/doff right sock, Don/doff left sock Shoes - Performed by patient: Don/doff right shoe, Don/doff left shoe Shoes - Performed by helper: Don/doff right shoe, Don/doff left shoe Assist for footwear: Dependant Assist for lower body dressing: Touching or steadying assistance (Pt > 75%)  Function - Toileting Toileting steps completed by patient: Adjust clothing prior to toileting, Performs perineal hygiene, Adjust clothing  after toileting Toileting steps completed by helper: Adjust clothing prior to toileting Toileting Assistive Devices: Grab bar or rail Assist level: Touching or steadying assistance (Pt.75%)  Function - Air cabin crew transfer assistive device: Cane Assist level to toilet: Touching or steadying assistance (Pt > 75%) Assist level from toilet: Touching or steadying assistance (Pt > 75%)  Function - Chair/bed transfer Chair/bed transfer method: Ambulatory Chair/bed transfer assist level: Supervision or verbal cues Chair/bed transfer assistive device: Cane Chair/bed transfer details: Verbal cues for technique  Function - Locomotion: Wheelchair Will patient use wheelchair at discharge?: No Type: Manual Max wheelchair distance: 150 Assist Level: Supervision or verbal cues Wheel 50 feet with 2 turns activity did not occur: Safety/medical concerns Assist Level: Supervision or verbal cues Wheel 150 feet activity did not occur: Safety/medical concerns Assist Level: Supervision or verbal cues Turns around,maneuvers to table,bed, and toilet,negotiates 3% grade,maneuvers on rugs and over doorsills: No Function - Locomotion: Ambulation Ambulation activity did not occur: Safety/medical concerns Assistive device: Cane-straight Max distance: 200 Assist level: Touching or steadying assistance (Pt > 75%) Assist level: Touching or steadying assistance (Pt > 75%) Assist level: Supervision or verbal cues Walk 150 feet activity did not occur: Safety/medical concerns Assist level: Supervision or verbal cues Walk 10 feet on uneven surfaces activity did not occur: Safety/medical concerns  Function - Comprehension Comprehension: Auditory Comprehension assist level: Understands complex 90% of the time/cues 10% of the time  Function - Expression Expression: Verbal Expression assist level: Expresses basic 75 - 89% of the time/requires cueing 10 - 24% of the time. Needs helper to occlude  trach/needs to repeat words.  Function - Social Interaction Social Interaction assist level: Interacts appropriately 90% of the time - Needs monitoring or encouragement for participation or interaction.  Function - Problem  Solving Problem solving assist level: Solves basic 75 - 89% of the time/requires cueing 10 - 24% of the time  Function - Memory Memory assist level: Recognizes or recalls 75 - 89% of the time/requires cueing 10 - 24% of the time Patient normally able to recall (first 3 days only): Staff names and faces, That he or she is in a hospital  Medical Problem List and Plan: 1. Right hemiparesis and aphasia secondary to bi-cerebral embolic infarcts (L>R)-.carotid ultrasounds demonstrated 1-39% stenosis bilateral ICA no other abnormalities noted , Particularly embolic.plan discharge home tomorrow after an abbreviated dialysis treatment 2. PE/DVT/Stoke/Anticoagulation: Pharmaceutical:Warfarin per pharmacy protocol INR 2.45 today 3. Pain Management: Ultram prn.  4. Mood: LCSW to follow for evaluation and support.  5. Neuropsych: This patient is not fully capable of making decisions on his own behalf. 6. Skin/Wound Care: Routine pressure relief measures.  7. Fluids/Electrolytes/Nutrition: Renal restrictions. 1200 cc/FR.  8. DM type 2: Monitor BS ac/hs. Continue levemir and use SSI for elevated BS.   Fasting CBG 120 today  continue current dose Levemir  -improved control   9. HTN: Monitor BP bid and titrate medications as needed. Elevated at admission. Parameters on Midodrine.  10. ESRD on HD: per [facility]. Continue diet/volume mgt, Tunnel dialysis catheter Placed. Hemodialysis today and tomorrow morning 11. Leukocytosis: afebrile no acute infx suspected -Per hematology this is reactive   12.  Hx of GI bleed will monitor for reurrence,Hgb Improved to 12.1 on Aranesp    -on daily CBC given warfarin and hep  13. Dysphagia- no sign of aspiration on D3 thin liquids  LOS (Days)  20 A FACE TO FACE EVALUATION WAS PERFORMED  KIRSTEINS,ANDREW E 11/01/2015, 8:40 AM

## 2015-11-01 NOTE — Telephone Encounter (Signed)
Advised Charlett Nose, scheduler for Dr. Acie Fredrickson, to schedule patient for appointment with Dr. Acie Fredrickson

## 2015-11-01 NOTE — Progress Notes (Signed)
Speech Language Pathology Discharge Summary  Patient Details  Name: Isaiah [name] [facility] MRN: [id number] Date of Birth: [birth year]  Today's Date: 11/01/2015 SLP Individual Time: 0800-0900; [id number] SLP Individual Time Calculation (min): 60 min; 30 min    Skilled Therapeutic Interventions:   Session 1:  Pt was seen for skilled ST targeting grad day activities.  Pt consuming breakfast upon arrival and was mod I for use of swallowing precautions with no overt s/s of aspiration with solids or liquids.  Pt has exceeded long term goals for dysphagia.  SLP administered remaining portions of the [place] Aphasia Battery and pt scored an overall 78/100 on the Aphasia Quotient, indicating mild impairment.  Across all administered domains of assessment, pt most impaired in information content, grammatical competence and paraphasias, word fluency, and following sequential commands.  Pt demonstrated improvement in sentence formulation in comparison to previously targeted sessions when completing a picture description task.  Pt was able to generate grammatically appropriate sentences for 100% accuracy with supervision cues during task, but required min assist verbal cues in 3/8 opportunities (i.e. Pt accurate in 5/8 opportunities without assistance) for accurate production of certain multi-syllabic words for intelligibility.  Pt left upright in wheelchair with call bell within reach.  Session 2:  Pt was seen for skilled ST targeting family education prior to discharge. Pt's wife and mother were present and remained actively engaged in education.  SLP provided skilled strategy instruction regarding techniques to maximize pt's functional independence for communication, including allowing pt extra time for communication, use of semantic, phonemic, and sentence completion cues.  Handout was provided to maximize carryover in the home environment.  All questions were answered to pt's and family's satisfaction at this time.  Pt was  left with family to provide transport back to room.     Patient has met 5 of 7 long term goals.  Patient to discharge at overall Min;Supervision level.  Reasons goals not met: min assist needed for word finding and emergent awareness of verbal errors   Clinical Impression/Discharge Summary:  Pt made excellent functional gains while inpatient and is discharging having met 5 out of 7 long term goals.  Pt's diet has been advanced to regular textures and thin liquids which he is now consuming with mod I use of swallowing precautions and minimal s/s of aspiration.  Pt is currently able to communicate at the sentence level with overall min assist-supervision cues for accurate production of multi-syllabic words for intelligibility, word finding, awareness of verbal errors, and grammatical completeness during sentence formulation.  As pt's communication continues to improve, he would benefit from a cognitive evaluation given his reports memory changes s/p CVA and high level of independence prior to admission.  Pt is discharging home with 24/7 supervision from family and recommendations for ST follow up at next level of care.  Pt and family education is complete at this time.    Caregiver Able to Provide Assistance: Yes  Type of Caregiver Assistance: Physical;Cognitive  Recommendation:  24 hour supervision/assistance;Outpatient SLP  Rationale for SLP Follow Up: Maximize functional communication;Maximize cognitive function and independence;Reduce caregiver burden   Equipment: none recommended by SLP    Reasons for discharge: Discharged from hospital   Patient/Family Agrees with Progress Made and Goals Achieved: Yes   Function:  Eating Eating   Modified Consistency Diet: No Eating Assist Level: Set up assist for;Swallowing techniques: self managed   Eating Set Up Assist For: Opening containers       Cognition Comprehension Comprehension assist level:  Understands complex 90% of the time/cues  10% of the time  Expression   Expression assist level: Expresses basic 75 - 89% of the time/requires cueing 10 - 24% of the time. Needs helper to occlude trach/needs to repeat words.  Social Interaction Social Interaction assist level: Interacts appropriately 90% of the time - Needs monitoring or encouragement for participation or interaction.  Problem Solving Problem solving assist level: Solves basic 75 - 89% of the time/requires cueing 10 - 24% of the time  Memory Memory assist level: Recognizes or recalls 75 - 89% of the time/requires cueing 10 - 24% of the time   Isaiah [name] 11/01/2015, 11:33 AM

## 2015-11-01 NOTE — Discharge Summary (Signed)
Physician Discharge Summary  Patient ID: [place] MRN: [id number] DOB/AGE: 1966-04-05 50 y.o.  Admit date: 10/12/2015 Discharge date: 11/01/2015  Discharge Diagnoses:  Principal Problem:   Hemiparesis, aphasia, and dysphagia as late effects of cerebrovascular accident [name]) Active Problems:   PE (pulmonary embolism)   DM (diabetes mellitus) type II controlled with renal manifestation ([place])   ESRD on dialysis ([place])   Embolic stroke ([place])   Gait disturbance, post-stroke   Labile blood glucose   Acute blood loss anemia   Leukocytosis   ESRD (end stage renal disease) on dialysis ([place])   S/P dialysis catheter insertion (H[facility])   Acute deep vein thrombosis (DVT) of popliteal vein of left lower extremity (HCC)   CVA (cerebral infarction)   Discharged Condition: Stable.   Significant Diagnostic Studies: Dg Chest Port 1 View  10/16/2015  CLINICAL DATA:  Patient status post dialysis catheter insertion. EXAM: PORTABLE CHEST 1 VIEW COMPARISON:  Chest radiograph 10/15/2015 FINDINGS: Central venous catheter tip projects over the right atrium. Stable cardiomegaly. No consolidative pulmonary opacities. No pleural effusion or pneumothorax. Right shoulder joint degenerative changes. IMPRESSION: Central venous catheter tip projects over the right atrium. Cardiomegaly. No acute cardiopulmonary process. Electronically Signed   By: Lovey Newcomer M.D.   On: 10/16/2015 11:51   Dg Chest Port 1 View  10/15/2015  CLINICAL DATA:  Preoperative cardiovascular exam, IVC filter, diabetes mellitus, hypertension, asthma, stage IV chronic kidney disease EXAM: PORTABLE CHEST 1 VIEW COMPARISON:  Portable exam 1658 hours compared to 09/27/2014 FINDINGS: RIGHT jugular central venous catheter with tip projecting over RIGHT atrium. Upper normal size of cardiac silhouette. Mediastinal contours and pulmonary vascularity normal. Lungs clear. No pleural effusion or pneumothorax. Bones demineralized. IMPRESSION: No acute  abnormalities. Electronically Signed   By: Lavonia Dana M.D.   On: 10/15/2015 17:13   Dg Abd Portable 1v  10/15/2015  CLINICAL DATA:  Check IVC filter placement. EXAM: PORTABLE ABDOMEN - 1 VIEW COMPARISON:  None. FINDINGS: IVC filter projects at infrarenal location at the L2 vertebral body level. Cholecystectomy clips noted. Moderate volume stool. IMPRESSION: IVC filter at L2. Electronically Signed   By: Suzy Bouchard M.D.   On: 10/15/2015 17:13   Dg Fluoro Guide Cv Line-no Report  10/16/2015  CLINICAL DATA:  FLOURO GUIDE CV LINE Fluoroscopy was utilized by the requesting physician.  No radiographic interpretation.    Labs:  Basic Metabolic Panel:  Recent Labs Lab 10/30/15 1600 11/01/15 1739 11/02/15 0736  NA 137 132* 135  K 3.4* 3.9 3.8  CL 99* 95* 98*  CO2 23 23 25   GLUCOSE 227* 205* 149*  BUN 25* 28* 13  CREATININE 6.30* 7.14* 4.68*  CALCIUM 9.0 9.2 9.0  PHOS 4.4 4.8* 4.1    CBC:  Recent Labs Lab 10/31/15 0627 11/01/15 1740 11/02/15 0736  WBC 20.0* 18.4* 18.4*  HGB 12.1* 11.4* 11.6*  HCT 34.7* 32.7* 33.6*  MCV 85.0 83.0 84.8  PLT 518* 550* 538*    CBG:  Recent Labs Lab 11/01/15 1129 11/01/15 1624 11/01/15 2133 11/02/15 0614 11/02/15 1134  GLUCAP 181* 207* 75 136* 117*    Lab Results  Component Value Date   INR 2.28* 11/02/2015   INR 2.45* 11/01/2015   INR 1.89* 10/31/2015   PROTIME 16.8* 10/30/2014    Brief HPI:   [name] is a 50 year old RH-male with history of DM type 2 with diabetic, Sickle cell-thalassemia, B-PE 11/12, OSA, CKD stage V pending dialysis, anemia who was admitted to [facility] in [place] on 09/16/15 with N/V and blood stools with acute on chronic renal failure due to acute pancreatitis. He was treated with supportive care, IV bicarb as well as multiple units PRBC and EGD with severe stage IV erosive esophagitis with gastritis. Noted to have abnormal LFTs and Hep A, Hep B and Hep C panels  negative. HD initiated on 01/09 due to poor recover with decrease in UOP and currently ongoing on MWF. He developed hypoxic respiratory failure requiring BIPAP? and was found to have RUL/RML PE as well as left popliteal DVT. He was also found to have left retrocardiac PNA and treated with IV antibiotics as well as IV heparin. Elevated troponin's felt to be due to demand ischemia due to PE and was treated with BB and ASA per cardiology input. IVC filter was placed and post procedure was found to have right hand weakness.   CT head reportedly showed subacute to old strokes. Neurology was consulted for input and EMG showed evidence of profound neuropathy. He had worsening of symptoms with increase in right sided weakness, facial droop speech difficulty on 09/27/15. MRI on 01/21 showed Scattered foci of cortical, cerebellar and deep gray matter infarcts with mild petechial hemorrhage and developing laminar necrosis. On 01/22, he had worsening of aphasia and repeat MRI brain 1/22 showed new Restricted diffusion in left temporal and larger more prominent area in posterior left frontal/ perirolandic area involving motor strip in addition to numerous scattered infarcts in bilateral cerebral and cerebellar hemispheres. MRA without significant stenosis. Echo with positive bubble study and TEE was  positive for PFO. Hypercoagulation studies reported to be negative. IV heparin was changed to Lovenox due to recurrent embolic strokes on heparin. Swallow evaluation with moderate oral and mild pharyngeal dysphagia and MBS without aspiration. He was started on puree diet with thin liquids. Therapy ongoing and patient making gains. CIR was recommended for follow up therapy and chart/notes reviewed by Rehab MD and Rehab CM. He was deemed to be appropriate for CIR and cleared for admission    Hospital Course: [facility] was admitted to rehab 10/12/2015 for inpatient therapies to consist of PT, ST and OT at least three  hours five days a week. Past admission physiatrist, therapy team and rehab RN have worked together to provide customized collaborative inpatient rehab. Hem/Onc was consulted at admission due to concerns expressed by PharmD on Lovenox use.  Dr. Alen Blew recommended changing patient to IV heparin with serial H/H to monitor for recurrent bleeding.  He has tolerated this without difficulty.  Dr. Elmarie Shiley was consulted for management of dialysis and this was initiated on TTS after therapy to help with tolerance of activity.  He was started on aranesp for anemia of chronic disease and H/H is stable at 11.6/33.6.  Blood pressures were monitored on bid basis and hypotension has been managed with use of midodrine tid.  Palindrome catheter was placed by Dr. Oneida Alar on 02/07 and is being utilized for HD without difficulty.  He is to follow up with [facility] for UE mapping and input on dialysis cath placement after discharge   Dr. Erlinda Hong with neurology was consulted for input on anticoagulation and recommended transition to coumadin with INR goal 2 -2.5 as patient's H/H had been stable on heparin and no signs of bleeding noted. This was discussed with Dr. Alen Blew who felt that long term anticoagulation had considerable risk however felt that  full dose anticoagulation was with lower INR goal was reasonable due  to risk of thrombosis and devastating stroke. He has been referred to cardiology for input on PFO closure to decrease risk of stroke in the future. Coumadin was initiated on 02/14 and INR is 2.28 at discharge. He is to continue on coumadin 7.5 mg daily and HHRN to draw protime on 02/28 with results to Dr. Iran Planas.   CBC did show leucocytosis with WBC ranging from 15- 20. Dr.[name] was consulted for input and reported that patient had has 2 bone biopsies in the past that did not show hematological disorder. His WBC has ranged between 12,000-20,000 in the past 5 years and etiology likely reactive in nature. Respiratory status  has been stable and he is tolerating CPAP at nights.  His diabetes has been monitored on AC/HS basis and levemir was titrated up to 25 units as po intake has improved. He has been advised to check BS bid to tid basis and is to continue his home SSI regimen at discharge.  He was clipped for [facility] and days were changed to MWF per wife's request. He has had improvement in RUE strength to 3/5 proximal and 3 minus/5 distal, poor fine motor with finger to thumb opposition. RLE strength 4/5 hip flexor and extensor ankle dorsi flexor. He has had complaints of dizziness and was found to have gaze instability. He has been educated on gaze stabilization exercises. Mood and affect have been stable and he has shown good participation in therapy. He has progressed to supervision level and will continue to receive follow up [place], [place] and HHST by [facility] after discharge.    Rehab course: During patient's stay in rehab weekly team conferences were held to monitor patient's progress, set goals and discuss barriers to discharge. At admission, patient required max assist with mobility and moderate assist with basic self care tasks.  He had oral based dysphagia requiring dysphagia 1 diet and moderate cognitive impairments with poor awareness of deficits, and high level receptive deficits. He had basic expressive deficits requiring max semantic and phenomic cues and this was impacted by dysarthria. He has had improvement in activity tolerance, balance, postural control, as well as ability to compensate for deficits. He is has had improvement in functional use RUE  and RLE as well as improved awareness. He able to use LUE with supervision for self feeding and needs moderate facilitation to self feed with use of build up utensil and RUE. He is able to complete ADL tasks with supervision. He is modified independent for transfers and is able to ambulate 200 feet with straight cane and supervision. He has exceeded dysphagia goals  and has been advanced to regular textures. He is able to adhere to swallow precautions independently and is currently able to communicate at sentence level with min to supervision for accurate work production and intelligibility. [place] Aphasia Battery score of 78/100 indicated mild impairment and speech therapy for high level cognition recommended after discharge. Family education has been done with wife regarding all aspects of care, safety.Vestibular HEP and OTAGO HEP.     Disposition: 01-Home or Self Care  Diet: Renal/diabetic diet. 1200cc FR/day.  Special Instructions: 1. Check blood sugars 2-3 times a day. Use home scale SSI. 2. Contact MD for any signs of bleeding.  3. HHRN to draw protime on 02/28 with results to Dr. Loletta Specter.       Discharge Instructions    Ambulatory referral to Physical Medicine Rehab    Complete by:  As directed   2 weeks post  discharge--needs MWF.            Medication List    STOP taking these medications        allopurinol 300 MG tablet  Commonly known as:  ZYLOPRIM     amLODipine 10 MG tablet  Commonly known as:  NORVASC     aspirin 81 MG chewable tablet  Replaced by:  aspirin EC 81 MG tablet     atorvastatin 10 MG tablet  Commonly known as:  LIPITOR     budesonide-formoterol 160-4.5 MCG/ACT inhaler  Commonly known as:  SYMBICORT     colchicine 0.6 MG tablet     enoxaparin 120 MG/0.8ML injection  Commonly known as:  LOVENOX     furosemide 20 MG tablet  Commonly known as:  LASIX     insulin glargine 100 UNIT/ML injection  Commonly known as:  LANTUS  Replaced by:  Insulin Glargine 100 UNIT/ML Solostar Pen     insulin lispro 100 UNIT/ML injection  Commonly known as:  HUMALOG     lisinopril 10 MG tablet  Commonly known as:  PRINIVIL,ZESTRIL     montelukast 10 MG tablet  Commonly known as:  SINGULAIR     sodium bicarbonate 650 MG tablet      TAKE these medications        aspirin EC 81 MG tablet  Take 1 tablet (81 mg total)  by mouth daily.     cetirizine 10 MG tablet  Commonly known as:  ZYRTEC  Take 10 mg by mouth daily.     epoetin alfa 10000 UNIT/ML injection  Commonly known as:  EPOGEN,PROCRIT  Inject 5,000 Units into the skin once a week.     fluticasone furoate-vilanterol 100-25 MCG/INH Aepb  Commonly known as:  BREO ELLIPTA  Inhale 1 puff into the lungs daily.     Insulin Glargine 100 UNIT/ML Solostar Pen  Commonly known as:  LANTUS  Inject 25 Units into the skin daily at 10 pm.     midodrine 2.5 MG tablet  Commonly known as:  PROAMATINE  Take 1 tablet (2.5 mg total) by mouth 2 (two) times daily with a meal.     multivitamin Tabs tablet  Take 1 tablet by mouth at bedtime.     NEEDLE (DISP) 27 G 27G X 1/2" Misc  Commonly known as:  BD DISP NEEDLES  1 application by Does not apply route daily after supper.     pantoprazole 40 MG tablet  Commonly known as:  PROTONIX  Take 1 tablet (40 mg total) by mouth 2 (two) times daily.     warfarin 5 MG tablet  Commonly known as:  COUMADIN  Take 1.5 tablets (7.5 mg total) by mouth daily at 6 PM.       Follow-up Information    [facility]   Specialty:  Physical Medicine and Rehabilitation   Why:  office will call you with appointment   Contact information:   [place] [place] [phone]      Follow up with Antony Contras, MD. Call today.   Specialties:  Neurology, Radiology   Why:  for follow up appointment in 3 weeks.    Contact information:   [address] [place] [address] [phone]       Follow up with Ruta Hinds, MD On 11/15/2015.   Specialties:  Vascular Surgery, Cardiology   Why:  appointment at 10:45 am   Contact  information:   [address] [place] [address] [phone]       Follow up with Sherren Mocha, MD.   Specialty:  Cardiology   Why:  office will call you with appointment for PFO evaluation.    Contact  information:   [address] [place] [address] [phone]       Follow up with [facility], MD. Call in 2 days.   Specialty:  Oncology   Why:  for follow up appointment   Contact information:   [address] [place] [address] [phone]       Follow up with Foye Spurling, MD On 11/05/2015.   Specialty:  Internal Medicine   Why:  Appointment at 2 pm   Contact information:   [address] [place] [address] [phone]       Signed: Bary Leriche 11/05/2015, 3:34 PM

## 2015-11-01 NOTE — Progress Notes (Signed)
ANTICOAGULATION CONSULT NOTE - Follow Up Consult  Pharmacy Consult for Coumadin Indication: pulmonary embolus, DVT and stroke  Allergies  Allergen Reactions  . Codeine Other (See Comments)    Pt unknown  . Codeine Rash and Other (See Comments)    Unknown reaction (patient says it was more serious than just a rash, but he can't remember what happened)    Patient Measurements: Height: 5' 11"  (180.3 cm) Weight: 195 lb 15.8 oz (88.9 kg) IBW/kg (Calculated) : 75.3   Vital Signs: Temp: 97.8 F (36.6 C) (02/23 0456) Temp Source: Oral (02/23 0456) BP: 117/74 mmHg (02/23 0456) Pulse Rate: 95 (02/23 0456)  Labs:  Recent Labs  10/30/15 0603 10/30/15 1600 10/31/15 0627 11/01/15 0519  HGB 11.1* 10.5* 12.1*  --   HCT 32.0* 30.6* 34.7*  --   PLT 506* 495* 518*  --   LABPROT 22.3*  --  21.7* 26.3*  INR 1.97*  --  1.89* 2.45*  HEPARINUNFRC 0.59  --   --   --   CREATININE  --  6.30*  --   --     Estimated Creatinine Clearance: 14.8 mL/min (by C-G formula based on Cr of 6.3).  Assessment: 49yo on with recent CVA on Coumadin for recurrent PE/DVT.  INR trending up to 2.45 today.  No bleeding noted.   Goal of Therapy:  Heparin level 0.3-0.5 units/ml INR 2-2.5 Monitor platelets by anticoagulation protocol: Yes   Plan:  Coumadin 5 mg po today Daily INR  Maryanna Shape, PharmD, BCPS  Clinical Pharmacist  Pager: [phone]   11/01/2015 10:45 AM

## 2015-11-02 ENCOUNTER — Telehealth: Payer: Self-pay | Admitting: Physical Medicine & Rehabilitation

## 2015-11-02 DIAGNOSIS — D688 Other specified coagulation defects: Secondary | ICD-10-CM

## 2015-11-02 DIAGNOSIS — Z4931 Encounter for adequacy testing for hemodialysis: Secondary | ICD-10-CM | POA: Insufficient documentation

## 2015-11-02 DIAGNOSIS — E1151 Type 2 diabetes mellitus with diabetic peripheral angiopathy without gangrene: Secondary | ICD-10-CM | POA: Insufficient documentation

## 2015-11-02 DIAGNOSIS — R0602 Shortness of breath: Secondary | ICD-10-CM

## 2015-11-02 DIAGNOSIS — L299 Pruritus, unspecified: Secondary | ICD-10-CM | POA: Insufficient documentation

## 2015-11-02 DIAGNOSIS — T829XXA Unspecified complication of cardiac and vascular prosthetic device, implant and graft, initial encounter: Secondary | ICD-10-CM | POA: Insufficient documentation

## 2015-11-02 DIAGNOSIS — R197 Diarrhea, unspecified: Secondary | ICD-10-CM | POA: Insufficient documentation

## 2015-11-02 DIAGNOSIS — R509 Fever, unspecified: Secondary | ICD-10-CM | POA: Insufficient documentation

## 2015-11-02 HISTORY — DX: Pruritus, unspecified: L29.9

## 2015-11-02 HISTORY — DX: Shortness of breath: R06.02

## 2015-11-02 HISTORY — DX: Other specified coagulation defects: D68.8

## 2015-11-02 LAB — RENAL FUNCTION PANEL
Albumin: 3.2 g/dL — ABNORMAL LOW (ref 3.5–5.0)
Anion gap: 12 (ref 5–15)
BUN: 13 mg/dL (ref 6–20)
CO2: 25 mmol/L (ref 22–32)
Calcium: 9 mg/dL (ref 8.9–10.3)
Chloride: 98 mmol/L — ABNORMAL LOW (ref 101–111)
Creatinine, Ser: 4.68 mg/dL — ABNORMAL HIGH (ref 0.61–1.24)
GFR calc Af Amer: 15 mL/min — ABNORMAL LOW (ref >60)
GFR calc non Af Amer: 13 mL/min — ABNORMAL LOW (ref >60)
Glucose, Bld: 149 mg/dL — ABNORMAL HIGH (ref 65–99)
Phosphorus: 4.1 mg/dL (ref 2.5–4.6)
Potassium: 3.8 mmol/L (ref 3.5–5.1)
Sodium: 135 mmol/L (ref 135–145)

## 2015-11-02 LAB — GLUCOSE, CAPILLARY
Glucose-Capillary: 136 mg/dL — ABNORMAL HIGH (ref 65–99)
Glucose-Capillary: 117 mg/dL — ABNORMAL HIGH (ref 65–99)

## 2015-11-02 LAB — CBC
HCT: 33.6 % — ABNORMAL LOW (ref 39.0–52.0)
Hemoglobin: 11.6 g/dL — ABNORMAL LOW (ref 13.0–17.0)
MCH: 29.3 pg (ref 26.0–34.0)
MCHC: 34.5 g/dL (ref 30.0–36.0)
MCV: 84.8 fL (ref 78.0–100.0)
Platelets: 538 10*3/uL — ABNORMAL HIGH (ref 150–400)
RBC: 3.96 MIL/uL — ABNORMAL LOW (ref 4.22–5.81)
RDW: 17.6 % — ABNORMAL HIGH (ref 11.5–15.5)
WBC: 18.4 10*3/uL — ABNORMAL HIGH (ref 4.0–10.5)

## 2015-11-02 LAB — PROTIME-INR
INR: 2.28 — ABNORMAL HIGH (ref 0.00–1.49)
Prothrombin Time: 24.9 seconds — ABNORMAL HIGH (ref 11.6–15.2)

## 2015-11-02 MED ORDER — INSULIN GLARGINE 100 UNIT/ML SOLOSTAR PEN: 24.0000 [IU] | PEN_INJECTOR | Freq: Every day | SUBCUTANEOUS | Status: DC

## 2015-11-02 MED ORDER — WARFARIN SODIUM 7.5 MG PO TABS: 7.5000 mg | ORAL_TABLET | Freq: Every day | ORAL | Status: DC

## 2015-11-02 MED ORDER — FLUTICASONE FUROATE-VILANTEROL 100-25 MCG/INH IN AEPB: 1.0000 | INHALATION_SPRAY | Freq: Every day | RESPIRATORY_TRACT | Status: DC

## 2015-11-02 MED ORDER — "NEEDLE (DISP) 27G X 1/2"" MISC": 1.0000 "application " | Status: DC

## 2015-11-02 MED ORDER — RENA-VITE PO TABS: 1.0000 | ORAL_TABLET | Freq: Every day | ORAL | Status: DC

## 2015-11-02 MED ORDER — INSULIN GLARGINE 100 UNIT/ML SOLOSTAR PEN: 25.0000 [IU] | PEN_INJECTOR | Freq: Every day | SUBCUTANEOUS | Status: DC

## 2015-11-02 MED ORDER — WARFARIN SODIUM 5 MG PO TABS: 7.5000 mg | ORAL_TABLET | Freq: Every day | ORAL | Status: DC

## 2015-11-02 MED ORDER — BUDESONIDE-FORMOTEROL FUMARATE 160-4.5 MCG/ACT IN AERO: 2.0000 | INHALATION_SPRAY | Freq: Two times a day (BID) | RESPIRATORY_TRACT | Status: DC

## 2015-11-02 MED ORDER — ASPIRIN EC 81 MG PO TBEC: 81.0000 mg | DELAYED_RELEASE_TABLET | Freq: Every day | ORAL | Status: DC

## 2015-11-02 MED ORDER — MIDODRINE HCL 2.5 MG PO TABS: 2.5000 mg | ORAL_TABLET | Freq: Two times a day (BID) | ORAL | Status: DC

## 2015-11-02 MED ORDER — PANTOPRAZOLE SODIUM 40 MG PO TBEC: 40.0000 mg | DELAYED_RELEASE_TABLET | Freq: Two times a day (BID) | ORAL | Status: DC

## 2015-11-02 MED FILL — CONTOUR NEXT METER: W/DEVICE | 30 days supply | Qty: 1 | Fill #0

## 2015-11-02 MED FILL — MICROLET LANCETS: 33 days supply | Qty: 100 | Fill #0

## 2015-11-02 MED FILL — ASPIR-LOW EC 81 MG TABLET: 81 | 90 days supply | Qty: 90 | Fill #0

## 2015-11-02 MED FILL — RENA-VITE TABLET: 30 days supply | Qty: 30 | Fill #0

## 2015-11-02 MED FILL — BREO ELLIPTA 100-25 MCG INH: 100-25 | 30 days supply | Qty: 60 | Fill #0

## 2015-11-02 MED FILL — UNIFINE PENTIPS 8MM 31G: 31G X 8 MM | 90 days supply | Qty: 100 | Fill #0 | Status: TO

## 2015-11-02 MED FILL — CONTOUR NEXT STRIPS: 33 days supply | Qty: 100 | Fill #0 | Status: TO

## 2015-11-02 MED FILL — PANTOPRAZOLE SOD DR 40 MG T: 40 | 30 days supply | Qty: 60 | Fill #0

## 2015-11-02 MED FILL — WARFARIN SODIUM 5 MG TABLET: 5 | 30 days supply | Qty: 45 | Fill #0 | Status: TO

## 2015-11-02 MED FILL — LANTUS SOLOSTAR 100 UNITS/M: 100 | 90 days supply | Qty: 24 | Fill #0 | Status: TO

## 2015-11-02 MED FILL — MIDODRINE HCL 2.5 MG TABLET: 2.5 | 30 days supply | Qty: 60 | Fill #0

## 2015-11-02 NOTE — Procedures (Signed)
Patient was seen on dialysis and the procedure was supervised.  BFR 400  Via PC BP is  115/79.   Patient appears to be tolerating treatment well- for discharge today and has been set up at [facility] on TTS schedule- to start there 2/28-  He will eventually need a permanent access but needs to be cleared by neuro before it is done   Sanaii Caporaso A 11/02/2015

## 2015-11-02 NOTE — Progress Notes (Signed)
Social Work Patient ID: Isaiah [name], male   DOB: [birth year], 50 y.o.   MRN: [id number]   CSW met with pt and pt's wife to update them on team conference discussion.  Pt's wife feels they have everything in order for d/c and pt feels ready to go home.  Pt will have f/u with multiple doctors in addition to dialysis three times a week.  Pt already has a handicap placard to use in the car.  CSW to order DME and arrange HH.  CSW gave pt's wife a list of male PCPs in order to switch pt to a new physician and she is to decide where she wants pt to go.  PA, Reesa Chew, has worked with pt's wife on this, as well.  CSW remains available to assist as needed.

## 2015-11-02 NOTE — Telephone Encounter (Addendum)
Spoke with [name] (wife) and she is going to call back to schedule appointment  1. Are you/is patient experiencing any problems since coming home? Are there any questions regarding any aspect of care? No 2. Are there any questions regarding medications administration/dosing? Are meds being taken as prescribed? Patient should review meds with caller to confirm All RX obtained without trouble 3. Have there been any falls?No 4. Has Home Health been to the house and/or have they contacted you? If not, have you tried to contact them? Can we help you contact them? Wife will call if she has any trouble 5. Are bowels and bladder emptying properly? Are there any unexpected incontinence issues? If applicable, is patient following bowel/bladder programs? Not yet 6. Any fevers, problems with breathing, unexpected pain? No 7. Are there any skin problems or new areas of breakdown? No 8. Has the patient/family member arranged specialty MD follow up (ie cardiology/neurology/renal/surgical/etc)?  Can we help arrange? Wife will call if she has any problems 9. Does the patient need any other services or support that we can help arrange? Not that she can think of 10. Are caregivers following through as expected in assisting the patient? So far- yes  Follow up appointment Made for March 10 at 10:00am We arranged the appointment around dialysis

## 2015-11-02 NOTE — Progress Notes (Signed)
ANTICOAGULATION CONSULT NOTE - Follow Up Consult  Pharmacy Consult for Coumadin Indication: pulmonary embolus, DVT and stroke  Allergies  Allergen Reactions  . Codeine Other (See Comments)    Pt unknown  . Codeine Rash and Other (See Comments)    Unknown reaction (patient says it was more serious than just a rash, but he can't remember what happened)    Patient Measurements: Height: 5' 11"  (180.3 cm) Weight: 192 lb 14.4 oz (87.5 kg) IBW/kg (Calculated) : 75.3   Vital Signs: Temp: 97.9 F (36.6 C) (02/24 1015) Temp Source: Oral (02/24 1015) BP: 103/71 mmHg (02/24 1015) Pulse Rate: 99 (02/24 1015)  Labs:  Recent Labs  10/30/15 1600 10/31/15 0627 11/01/15 0519 11/01/15 1739 11/01/15 1740 11/02/15 0445 11/02/15 0736  HGB 10.5* 12.1*  --   --  11.4*  --  11.6*  HCT 30.6* 34.7*  --   --  32.7*  --  33.6*  PLT 495* 518*  --   --  550*  --  538*  LABPROT  --  21.7* 26.3*  --   --  24.9*  --   INR  --  1.89* 2.45*  --   --  2.28*  --   CREATININE 6.30*  --   --  7.14*  --   --  4.68*    Estimated Creatinine Clearance: 19.9 mL/min (by C-G formula based on Cr of 4.68).  Assessment: 49yo on with recent CVA on Coumadin for recurrent PE/DVT.  INR trending up to 2.28 today. Yesterday's dose was not charted, possibly missed dose since pt was off the floor for HD last night. No bleeding noted. Plan for discharge today.  Goal of Therapy:  INR 2-2.5 Monitor platelets by anticoagulation protocol: Yes   Plan:  Coumadin 7.5 mg po daily Recommend INR check Monday or Tuesday Daily INR  Maryanna Shape, PharmD, BCPS  Clinical Pharmacist  Pager: [phone]   11/02/2015 12:33 PM

## 2015-11-02 NOTE — Progress Notes (Signed)
Discharge Note  The overall goal for the admission was met for:   Discharge location: Yes - home  Length of Stay: Yes - 21 days  Discharge activity level: Yes - Supervision  Home/community participation: Yes  Services provided included: MD, RD, PT, OT, SLP, RN, Pharmacy and SW  Financial Services: Private Insurance: [employer] [place] and [employer]  Follow-up services arranged: Home Health: PT/OT/ST, DME: single point cane and bedside commode and Patient/Family request agency HH: [facility], DME: [facility]  Comments (or additional information):  Pt to d/c to his home with his wife and mother to be with him 24/7.  Pt's wife received family education with all three therapy disciplines and family conference was held 2 days before pt's d/c with medical and therapy teams.  Pt will have [place] initially and work his way to outpt therapy.  Pt to go to dialysis Tuesday, Thursday, Saturday at [facility].  Pt has necessary DME.    Patient/Family verbalized understanding of follow-up arrangements: Yes  Individual responsible for coordination of the follow-up plan: pt's wife  Confirmed correct DME delivered: Trey Sailors 11/02/2015    Nedra Mcinnis, Silvestre Mesi

## 2015-11-02 NOTE — Progress Notes (Signed)
Patient A/O, no noted distress. Denies pain. Arrived on the unit at 2130. Tolerated meds well and ate supper. Staff will continue to meet patient needs, monitor, and maintain safety. Staff left unit at [phone], RN Carolynn Comment for dialysis.

## 2015-11-02 NOTE — Progress Notes (Signed)
Patient discharged to home accompanied by his wife and daughter.

## 2015-11-02 NOTE — Progress Notes (Signed)
Social Work Patient ID: Isaiah [name], male   DOB: 1966-05-24, 50 y.o.   MRN: [id number]   Patient/Family Conference  10-31-15 at [place] in attendance:  [facility], pt's wife  Staff in attendance:  Dr. Alysia Penna, MD; Lennart Pall, LCSW; Raylene Everts, PT; Clyda Greener, OT  Main focus:  Pt's wife wanted to sit down with the team prior to pt's discharge to get any information she needed prior to taking pt home.  Synopsis of information shared:  Dr. Letta Pate addressed medical concerns and clarified what physicians pt will need to f/u with after d/c.  He also shared with pt's wife that pt is likely permanently disabled.  PT shared that pt is using a cane with ambulation and doing well with that.  Pt has some vestibular issues which affect his balance and PT will continue to address these.  They plan to recheck pt's BERG today.  OT reported that pt is doing better than expected and most goals for OT have been upgraded to supervision.  He suggested that family encourage pt to use his right hand/arm as much as possible.  His double vision is still there, but has improved.  DME was also discussed - pt will need single point cane and 3-in-1 commode.  Pt will be put on first run for dialysis on Friday prior to d/c.  Barriers/concerns expressed by patient and family:  none  Patient/family response:  Pt's wife had no follow up questions and felt that the information was helpful.  She was appreciative of the opportunity to sit down with the team.  Follow-up/action plans:  Pt's wife to receive family education on 11-01-15 in the afternoon so that she can feel comfortable with pt's progress and his care at home.  *Lennart Pall, LCSW attended the family conference on behalf of this [place].

## 2015-11-03 DIAGNOSIS — I69322 Dysarthria following cerebral infarction: Secondary | ICD-10-CM | POA: Diagnosis not present

## 2015-11-03 DIAGNOSIS — Z794 Long term (current) use of insulin: Secondary | ICD-10-CM | POA: Diagnosis not present

## 2015-11-03 DIAGNOSIS — Z7901 Long term (current) use of anticoagulants: Secondary | ICD-10-CM | POA: Diagnosis not present

## 2015-11-03 DIAGNOSIS — Z8719 Personal history of other diseases of the digestive system: Secondary | ICD-10-CM | POA: Diagnosis not present

## 2015-11-03 DIAGNOSIS — I69351 Hemiplegia and hemiparesis following cerebral infarction affecting right dominant side: Secondary | ICD-10-CM | POA: Diagnosis not present

## 2015-11-03 DIAGNOSIS — I69319 Unspecified symptoms and signs involving cognitive functions following cerebral infarction: Secondary | ICD-10-CM | POA: Diagnosis not present

## 2015-11-06 ENCOUNTER — Ambulatory Visit (INDEPENDENT_AMBULATORY_CARE_PROVIDER_SITE_OTHER): Payer: BLUE CROSS/BLUE SHIELD | Admitting: Cardiovascular Disease

## 2015-11-06 ENCOUNTER — Telehealth: Payer: Self-pay | Admitting: Cardiovascular Disease

## 2015-11-06 ENCOUNTER — Encounter: Payer: Self-pay | Admitting: Cardiovascular Disease

## 2015-11-06 VITALS — BP 98/62 | HR 98 | Ht 71.0 in | Wt 195.8 lb

## 2015-11-06 DIAGNOSIS — I2699 Other pulmonary embolism without acute cor pulmonale: Secondary | ICD-10-CM | POA: Diagnosis not present

## 2015-11-06 DIAGNOSIS — I6340 Cerebral infarction due to embolism of unspecified cerebral artery: Secondary | ICD-10-CM | POA: Diagnosis not present

## 2015-11-06 DIAGNOSIS — Q2112 Patent foramen ovale: Secondary | ICD-10-CM

## 2015-11-06 DIAGNOSIS — Q211 Atrial septal defect: Secondary | ICD-10-CM

## 2015-11-06 NOTE — Progress Notes (Signed)
Cardiology Office Note  Date:  11/06/2015   Patient ID:  Isaiah [name] 13-Sep-1965, [name]  PCP:  Isaiah Spurling, MD  Cardiologist: Previously saw Dr. Montez [name] - here to establish care with Dr. Acie [name] NOTE: The patient has 2 MRNs (other one is [id number])   Problem list 1. Pulmonary emboli. She was admitted January, 2016 with hypoxemia and was found to have a DVT and pulmonary embolus. He was started on Coumadin and also had an IVC filter placed. 2. Sickle cell trait. 3. Chronic kidney disease - started dialysis in Jan. 2017 4. Hypertension 5. Diabetes mellitus  6. Obstructive sleep apnea 7. Recent CVA with  Evidence of a PFO     Jan. 20,   2016  ( Notation from [employer])  Isaiah [name] is a 50 y.o. male with history of sickle cell trait, bilateral PE 07/2011, HTN, DM, OSA (uses CPAP), obesity (recently lost 25lb unintentionally), monoclonal gammopathy/anemia (saw heme in 2013), CKD (Cr 2.1 in 2014, no recent labs available) who is here for eval of DOE and decreased O2 sats. Per review of other MRN's chart, he had elevated troponins at time of his PEs in 2012 felt due to right heart strain. Cause of PE unknown. He says he was treated with 6 months of Coumadin. He was seen by Dr. Montez [name] in 2014 (he states due to a murmur) at which time 2D echo 10/2012 showed mild LVH, EF 55-60%, grade 1 DD, interatrial septum is mobile, mildly dilated LA. Nuc 10/2012: fixed defect in anteroseptal wall most c/w infarct, no reversible ischemia, mild apical dyskinesia, LV dilitation, EF 47%. He denies prior cardiac cath.   Feb. 28, 2017   There'll seen back for a visit. I saw him one year ago. He works down in [place]. He had a stroke. He had a transesophageal echo that identified a patent foramen ovale. [facility]  - [phone] Dr. Pauline [name] and Dr. Joaquim [name] .  ( office number [phone])   Isaiah [name] at [facility] has some records [phone]   He was  brought back up to [name] after his stroke and had a several week stay for rehabilitation. He was started on hemodialysis in January, 2017. Has a right chest catheter      Past Medical History  Diagnosis Date  . Diabetes mellitus without complication ([place])   . Sickle cell-thalassemia disease ([place])     a. Sickle cell trait.  . Pulmonary embolism ([place]) 07/2011    a. Tx with Coumadin for 6 months (unknown cause per patient).  . Hyperkalemia 07/2011  . Acute renal failure ([place]) 07/2011  . Monoclonal gammopathy   . Sleep apnea     a. uses CPAP.  [name] Hypertension   . Gout   . Pulmonary embolism ([place]) 07/2011; 09/27/2014    a. Bilat PE 07/2011 - unclear cause, tx with 6 months Coumadin.;   . Monoclonal gammopathies   . Anemia   . Asthma   . OSA on CPAP   . History of recent blood transfusion 10/27/14    2 Units PRBC's  . ESRD (end stage renal disease) on dialysis [facility])     Past Surgical History  Procedure Laterality Date  . Cholecystectomy    . Bone marrow biopsy    . Other surgical history      Retinal surgery  . Pars plana vitrectomy  02/17/2012    Procedure: PARS PLANA VITRECTOMY WITH 25 GAUGE;  Surgeon: Isaiah Pedro, MD;  Location: [facility] OR;  Service: Ophthalmology;  Laterality: Right;  . Cholecystectomy  1990's?  . Eye surgery Right   . Insertion of dialysis catheter Right 10/16/2015    Procedure: INSERTION OF PALINDROME DIALYSIS CATHETER ;  Surgeon: Isaiah Dutch, MD;  Location: [facility];  Service: Vascular;  Laterality: Right;    Current Outpatient Prescriptions  Medication Sig Dispense Refill  . aspirin EC 81 MG tablet Take 1 tablet (81 mg total) by mouth daily. 100 tablet 0  . cetirizine (ZYRTEC) 10 MG tablet Take 10 mg by mouth daily.     [name] epoetin alfa (EPOGEN,PROCRIT) 44315 UNIT/ML injection Inject 5,000 Units into the skin once a week.     . fluticasone furoate-vilanterol (BREO ELLIPTA) 100-25 MCG/INH AEPB Inhale 1 puff into the lungs daily. 60 each 1  .  Insulin Glargine (LANTUS) 100 UNIT/ML Solostar Pen Inject 25 Units into the skin daily at 10 pm. 15 mL 1  . midodrine (PROAMATINE) 2.5 MG tablet Take 1 tablet (2.5 mg total) by mouth 2 (two) times daily with a meal. 60 tablet 1  . multivitamin (RENA-VIT) TABS tablet Take 1 tablet by mouth at bedtime. 30 tablet 1  . NEEDLE, DISP, 27 G (BD DISP NEEDLES) 27G X 1/2" MISC 1 application by Does not apply route daily after supper. 100 each 0  . pantoprazole (PROTONIX) 40 MG tablet Take 1 tablet (40 mg total) by mouth 2 (two) times daily. 60 tablet 1  . warfarin (COUMADIN) 5 MG tablet Take 1.5 tablets (7.5 mg total) by mouth daily at 6 PM. 45 tablet 1   No current facility-administered medications for this visit.    Allergies:   Codeine and Codeine   Social History:  The patient  reports that he has never smoked. He has never used smokeless tobacco. He reports that he does not drink alcohol or use illicit drugs.   Family History:  The patient's family history includes Diabetes in his mother; Diabetes type I in his brother; Hypertension in his mother; Kidney disease in his brother; Sickle cell anemia in his brother.  ROS:  Please see the history of present illness. No BRBPR, melena, hematemesis.  All other systems are reviewed and otherwise negative.   PHYSICAL EXAM:  VS:  BP 98/62 mmHg  Pulse 98  Ht 5' 11"  (1.803 m)  Wt 195 lb 12.8 oz (88.814 kg)  BMI 27.32 kg/m2  SpO2 99% BMI: Body mass index is 27.32 kg/(m^2). Well nourished, well developed AAM, in no acute distress HEENT: normocephalic, atraumatic Neck: no JVD Cardiac:  normal S1, S2; RRR; no murmur Chest - HD catheter in the right side of his chest  Lungs:  clear to auscultation bilaterally, no wheezing, rhonchi or rales Abd: soft, nontender, no hepatomegaly Ext: no edema Skin: warm and dry Neuro:  moves all extremities spontaneously, no focal abnormalities noted  Recent Labs: 09/06/2015: ALT 9 10/24/2015: TSH 2.018 11/02/2015: BUN  13; Creatinine, Ser 4.68*; Hemoglobin 11.6*; Platelets 538*; Potassium 3.8; Sodium 135  10/24/2015: Cholesterol 128; HDL 48; LDL Cholesterol 62; Total CHOL/HDL Ratio 2.7; Triglycerides 88; VLDL 18   Estimated Creatinine Clearance: 19.9 mL/min (by C-G formula based on Cr of 4.68).   Wt Readings from Last 3 Encounters:  11/06/15 195 lb 12.8 oz (88.814 kg)  11/02/15 192 lb 14.4 oz (87.5 kg)  09/06/15 241 lb 9.6 oz (109.589 kg)     Other studies reviewed: Additional studies/records reviewed today include: 2D echo 10/2012 - Left ventricle: The cavity size was  normal. Wall thickness was increased in a pattern of mild LVH. Systolic function was normal. The estimated ejection fraction was in the range of 55% to 60%. Doppler parameters are consistent with abnormal left ventricular relaxation (grade 1 diastolic dysfunction). - Left atrium: Interatrial septum is mobile. The atrium was mildly dilated.  2D echo 10/24/15 The estimated ejection fraction was in the range of 40% to 45%.   Nuc 10/2012 IMPRESSION: 1. Fixed defect within the anteroseptal wall is most consistent with infarct. No evidence reversible ischemia. 2. Mild apical dyskinesia. Left ventricular dilatation.   3. Left ventricular ejection fraction equal47%   ASSESSMENT AND PLAN:  1. CVA - has a documented patent foramen ovale vs.  atrial septal defect.   TEE was performed in [place], [place].   He was off Coumadin at that time. He is now back on Coumadin.  2. Pulmonary embolus: He'll need continued Coumadin therapy lifelong.  3. CKD - now on HD.   INR will be drawn in dialysis and managed by Dr. Carlis Abbott.   4. Obstructive sleep apnea   5. PFO  - was told that he may need for this to be closed. He has in IVC filter so any attempts to close this would have to be from the arm.     Chatara Lucente, Wonda Cheng, MD  11/06/2015 10:44 AM    [address],  [place], [place]   [id number] Pager [phone] Phone: [phone]; Fax: [phone]

## 2015-11-06 NOTE — Telephone Encounter (Signed)
Records received from [facility] placed in chart prep bin.

## 2015-11-06 NOTE — Patient Instructions (Signed)
Medication Instructions:  Your physician recommends that you continue on your current medications as directed. Please refer to the Current Medication list given to you today.   Labwork: None Ordered   Testing/Procedures: None Ordered   Follow-Up: Your physician wants you to follow-up in: 6 months with Dr. Nahser.  You will receive a reminder letter in the mail two months in advance. If you don't receive a letter, please call our office to schedule the follow-up appointment.   If you need a refill on your cardiac medications before your next appointment, please call your pharmacy.   Thank you for choosing [facility]! Michelle Swinyer, RN [phone]    

## 2015-11-07 ENCOUNTER — Other Ambulatory Visit: Payer: Self-pay | Admitting: *Deleted

## 2015-11-07 DIAGNOSIS — Z7901 Long term (current) use of anticoagulants: Secondary | ICD-10-CM | POA: Insufficient documentation

## 2015-11-07 DIAGNOSIS — Z8719 Personal history of other diseases of the digestive system: Secondary | ICD-10-CM | POA: Diagnosis not present

## 2015-11-07 DIAGNOSIS — Z01810 Encounter for preprocedural cardiovascular examination: Secondary | ICD-10-CM

## 2015-11-07 DIAGNOSIS — N189 Chronic kidney disease, unspecified: Secondary | ICD-10-CM

## 2015-11-07 DIAGNOSIS — I69319 Unspecified symptoms and signs involving cognitive functions following cerebral infarction: Secondary | ICD-10-CM | POA: Diagnosis not present

## 2015-11-07 DIAGNOSIS — I69351 Hemiplegia and hemiparesis following cerebral infarction affecting right dominant side: Secondary | ICD-10-CM | POA: Diagnosis not present

## 2015-11-07 DIAGNOSIS — I69322 Dysarthria following cerebral infarction: Secondary | ICD-10-CM | POA: Diagnosis not present

## 2015-11-07 DIAGNOSIS — Z794 Long term (current) use of insulin: Secondary | ICD-10-CM | POA: Diagnosis not present

## 2015-11-08 ENCOUNTER — Ambulatory Visit (HOSPITAL_COMMUNITY)
Admission: RE | Admit: 2015-11-08 | Discharge: 2015-11-08 | Disposition: A | Discharge status: Home / Self Care | Payer: BLUE CROSS/BLUE SHIELD | Source: Ambulatory Visit | Attending: Vascular Surgery | Admitting: Vascular Surgery

## 2015-11-08 ENCOUNTER — Ambulatory Visit (INDEPENDENT_AMBULATORY_CARE_PROVIDER_SITE_OTHER)
Admit: 2015-11-08 | Discharge: 2015-11-08 | Disposition: A | Discharge status: Home / Self Care | Payer: BLUE CROSS/BLUE SHIELD

## 2015-11-08 DIAGNOSIS — E1122 Type 2 diabetes mellitus with diabetic chronic kidney disease: Secondary | ICD-10-CM | POA: Insufficient documentation

## 2015-11-08 DIAGNOSIS — Z794 Long term (current) use of insulin: Secondary | ICD-10-CM | POA: Diagnosis not present

## 2015-11-08 DIAGNOSIS — Z7901 Long term (current) use of anticoagulants: Secondary | ICD-10-CM | POA: Diagnosis not present

## 2015-11-08 DIAGNOSIS — I69351 Hemiplegia and hemiparesis following cerebral infarction affecting right dominant side: Secondary | ICD-10-CM | POA: Diagnosis not present

## 2015-11-08 DIAGNOSIS — I120 Hypertensive chronic kidney disease with stage 5 chronic kidney disease or end stage renal disease: Secondary | ICD-10-CM | POA: Insufficient documentation

## 2015-11-08 DIAGNOSIS — I69322 Dysarthria following cerebral infarction: Secondary | ICD-10-CM | POA: Diagnosis not present

## 2015-11-08 DIAGNOSIS — Z8719 Personal history of other diseases of the digestive system: Secondary | ICD-10-CM | POA: Diagnosis not present

## 2015-11-08 DIAGNOSIS — Z01810 Encounter for preprocedural cardiovascular examination: Secondary | ICD-10-CM

## 2015-11-08 DIAGNOSIS — I69319 Unspecified symptoms and signs involving cognitive functions following cerebral infarction: Secondary | ICD-10-CM | POA: Diagnosis not present

## 2015-11-08 DIAGNOSIS — N189 Chronic kidney disease, unspecified: Secondary | ICD-10-CM | POA: Diagnosis not present

## 2015-11-08 DIAGNOSIS — N186 End stage renal disease: Secondary | ICD-10-CM | POA: Diagnosis not present

## 2015-11-09 ENCOUNTER — Telehealth: Payer: Self-pay | Admitting: Oncology

## 2015-11-09 NOTE — Telephone Encounter (Signed)
Per staff message response from FS he can see patient 3/30 @ 1:30 pm. Spoke with patient [name] re f/u date/time.

## 2015-11-12 ENCOUNTER — Telehealth: Payer: Self-pay

## 2015-11-12 ENCOUNTER — Encounter: Payer: Self-pay | Admitting: Vascular Surgery

## 2015-11-13 DIAGNOSIS — Z7901 Long term (current) use of anticoagulants: Secondary | ICD-10-CM | POA: Diagnosis not present

## 2015-11-13 DIAGNOSIS — I69322 Dysarthria following cerebral infarction: Secondary | ICD-10-CM | POA: Diagnosis not present

## 2015-11-13 DIAGNOSIS — Z794 Long term (current) use of insulin: Secondary | ICD-10-CM | POA: Diagnosis not present

## 2015-11-13 DIAGNOSIS — Z8719 Personal history of other diseases of the digestive system: Secondary | ICD-10-CM | POA: Diagnosis not present

## 2015-11-13 DIAGNOSIS — I69319 Unspecified symptoms and signs involving cognitive functions following cerebral infarction: Secondary | ICD-10-CM | POA: Diagnosis not present

## 2015-11-13 DIAGNOSIS — I69351 Hemiplegia and hemiparesis following cerebral infarction affecting right dominant side: Secondary | ICD-10-CM | POA: Diagnosis not present

## 2015-11-13 MED FILL — WARFARIN SODIUM 1 MG TABLET: 1 | 30 days supply | Qty: 30 | Fill #0

## 2015-11-15 ENCOUNTER — Encounter: Payer: Self-pay | Admitting: Vascular Surgery

## 2015-11-15 ENCOUNTER — Ambulatory Visit (INDEPENDENT_AMBULATORY_CARE_PROVIDER_SITE_OTHER): Payer: BLUE CROSS/BLUE SHIELD | Admitting: Vascular Surgery

## 2015-11-15 VITALS — BP 112/78 | HR 84 | Temp 97.9°F | Resp 16 | Ht 70.0 in | Wt 197.0 lb

## 2015-11-15 DIAGNOSIS — N186 End stage renal disease: Secondary | ICD-10-CM | POA: Diagnosis not present

## 2015-11-15 DIAGNOSIS — I69319 Unspecified symptoms and signs involving cognitive functions following cerebral infarction: Secondary | ICD-10-CM | POA: Diagnosis not present

## 2015-11-15 DIAGNOSIS — E1122 Type 2 diabetes mellitus with diabetic chronic kidney disease: Secondary | ICD-10-CM | POA: Diagnosis not present

## 2015-11-15 DIAGNOSIS — Z8719 Personal history of other diseases of the digestive system: Secondary | ICD-10-CM | POA: Diagnosis not present

## 2015-11-15 DIAGNOSIS — I69351 Hemiplegia and hemiparesis following cerebral infarction affecting right dominant side: Secondary | ICD-10-CM | POA: Diagnosis not present

## 2015-11-15 DIAGNOSIS — Z992 Dependence on renal dialysis: Secondary | ICD-10-CM | POA: Diagnosis not present

## 2015-11-15 DIAGNOSIS — I69322 Dysarthria following cerebral infarction: Secondary | ICD-10-CM | POA: Diagnosis not present

## 2015-11-15 DIAGNOSIS — Z794 Long term (current) use of insulin: Secondary | ICD-10-CM | POA: Diagnosis not present

## 2015-11-15 DIAGNOSIS — Z7901 Long term (current) use of anticoagulants: Secondary | ICD-10-CM | POA: Diagnosis not present

## 2015-11-15 NOTE — Progress Notes (Signed)
[facility]  Patient name: Isaiah [name] MRN: [id number] DOB: October 14, 1965 Sex: male  REASON FOR VISIT:   Permanent hemodialysis access HPI: Isaiah [name] is a 50 y.o. male who is seen in follow-up today for consideration of placement of a long-term hemodialysis access. The patient was initially seen as a consult in the hospital at the request of Dr. Florene Glen. The patient had had a stroke at that point from a paradoxical embolus from DVT. He is currently on Coumadin. He is dialyzing currently by catheter. Stroke affected his right arm and he has had some return of function in this arm. He is also having improvement of his speech. His current dialysis today is Monday Wednesday Friday.   Past Medical History  Diagnosis Date  . Diabetes mellitus without complication ([place])   . Sickle cell-thalassemia disease ([place])     a. Sickle cell trait.  . Pulmonary embolism ([place]) 07/2011    a. Tx with Coumadin for 6 months (unknown cause per patient).  . Hyperkalemia 07/2011  . Acute renal failure ([place]) 07/2011  . Monoclonal gammopathy   . Sleep apnea     a. uses CPAP.  [name] Hypertension   . Gout   . Pulmonary embolism ([place]) 07/2011; 09/27/2014    a. Bilat PE 07/2011 - unclear cause, tx with 6 months Coumadin.;   . Monoclonal gammopathies   . Anemia   . Asthma   . OSA on CPAP   . History of recent blood transfusion 10/27/14    2 Units PRBC's  . ESRD (end stage renal disease) on dialysis [facility])     Family History  Problem Relation Age of Onset  . Hypertension Mother   . Diabetes Mother   . Sickle cell anemia Brother   . Diabetes type I Brother   . Kidney disease Brother     SOCIAL HISTORY: Social History  Substance Use Topics  . Smoking status: Never Smoker   . Smokeless tobacco: Never Used  . Alcohol Use: No    Allergies  Allergen Reactions  . Codeine Other (See Comments)    Pt unknown  . Codeine Rash and Other (See Comments)    Unknown reaction  (patient says it was more serious than just a rash, but he can't remember what happened)    Current Outpatient Prescriptions  Medication Sig Dispense Refill  . aspirin EC 81 MG tablet Take 1 tablet (81 mg total) by mouth daily. 100 tablet 0  . cetirizine (ZYRTEC) 10 MG tablet Take 10 mg by mouth daily.     [name] epoetin alfa (EPOGEN,PROCRIT) 64403 UNIT/ML injection Inject 5,000 Units into the skin once a week.     . fluticasone furoate-vilanterol (BREO ELLIPTA) 100-25 MCG/INH AEPB Inhale 1 puff into the lungs daily. 60 each 1  . Insulin Glargine (LANTUS) 100 UNIT/ML Solostar Pen Inject 25 Units into the skin daily at 10 pm. 15 mL 1  . midodrine (PROAMATINE) 2.5 MG tablet Take 1 tablet (2.5 mg total) by mouth 2 (two) times daily with a meal. 60 tablet 1  . multivitamin (RENA-VIT) TABS tablet Take 1 tablet by mouth at bedtime. 30 tablet 1  . NEEDLE, DISP, 27 G (BD DISP NEEDLES) 27G X 1/2" MISC 1 application by Does not apply route daily after supper. 100 each 0  . pantoprazole (PROTONIX) 40 MG tablet Take 1 tablet (40 mg total) by mouth 2 (two) times daily. 60 tablet 1  . warfarin (COUMADIN) 5 MG tablet  Take 1.5 tablets (7.5 mg total) by mouth daily at 6 PM. 45 tablet 1   No current facility-administered medications for this visit.    REVIEW OF SYSTEMS:  [X]  denotes positive finding, [ ]  denotes negative finding Cardiac  Comments:  Chest pain or chest pressure:    Shortness of breath upon exertion:    Short of breath when lying flat:    Irregular heart rhythm:        Vascular    Pain in calf, thigh, or hip brought on by ambulation:    Pain in feet at night that wakes you up from your sleep:     Blood clot in your veins:    Leg swelling:         Pulmonary    Oxygen at home:    Productive cough:     Wheezing:         Neurologic    Sudden weakness in arms or legs:     Sudden numbness in arms or legs:     Sudden onset of difficulty speaking or slurred speech:    Temporary loss of  vision in one eye:     Problems with dizziness:         Gastrointestinal    Blood in stool:     Vomited blood:         Genitourinary    Burning when urinating:     Blood in urine:        Psychiatric    Major depression:         Hematologic    Bleeding problems:    Problems with blood clotting too easily:        Skin    Rashes or ulcers:        Constitutional    Fever or chills:      PHYSICAL EXAM: Filed Vitals:   11/15/15 1048  BP: 112/78  Pulse: 84  Temp: 97.9 F (36.6 C)  Resp: 16  Height: 5' 10"  (1.778 m)  Weight: 197 lb (89.359 kg)  SpO2: 98%    GENERAL: The patient is a well-nourished male, in no acute distress. The vital signs are documented above. CARDIAC: There is a regular rate and rhythm.  VASCULAR:  One plus right brachial and radial pulse PULMONARY: There is good air exchange bilaterally without wheezing or rales. ABDOMEN: Soft and non-tender with normal pitched bowel sounds.  MUSCULOSKELETAL: There are no major deformities or cyanosis. NEUROLOGIC:  3/5 motor strength right upper extremity some difficulty finding words for speech SKIN: There are no ulcers or rashes noted. PSYCHIATRIC: The patient has a normal affect.  DATA:   vein mapping ultrasound dated 11/08/2015 is reviewed today. Cephalic and basilic veins are quite small less than 2 mm in diameter throughout most of the course normal arterial configuration of the brachial and radial arteries with a 4 mm right brachial artery triphasic waveforms  MEDICAL ISSUES:  Patient needs long-term hemodialysis access. We will schedule him for placement of a right upper arm AV graft on March 29. He will stop his Coumadin on March 26. We will admit him for IV heparin on March 27 for bridging since he is high risk due to recent DVT and stroke. He will need to remain in the hospital postoperatively until he is fully quite dilated. Risks benefits possible palpitations and procedure details including but limited to  bleeding infection graft thrombosis 25% chance 1 year were explained the patient today. He understands and agrees to  proceed.   We will consult nephrology service during his hospital admission for dialysis needs during his hospitalization.  Ruta Hinds [facility] and [employer]: [phone]

## 2015-11-16 ENCOUNTER — Ambulatory Visit (HOSPITAL_BASED_OUTPATIENT_CLINIC_OR_DEPARTMENT_OTHER): Payer: BLUE CROSS/BLUE SHIELD | Admitting: Physical Medicine & Rehabilitation

## 2015-11-16 ENCOUNTER — Encounter: Payer: BLUE CROSS/BLUE SHIELD | Attending: Physical Medicine & Rehabilitation

## 2015-11-16 ENCOUNTER — Encounter: Payer: Self-pay | Admitting: Physical Medicine & Rehabilitation

## 2015-11-16 VITALS — BP 110/79 | HR 86

## 2015-11-16 DIAGNOSIS — Z992 Dependence on renal dialysis: Secondary | ICD-10-CM | POA: Diagnosis not present

## 2015-11-16 DIAGNOSIS — N179 Acute kidney failure, unspecified: Secondary | ICD-10-CM | POA: Diagnosis not present

## 2015-11-16 DIAGNOSIS — Z9115 Patient's noncompliance with renal dialysis: Secondary | ICD-10-CM | POA: Insufficient documentation

## 2015-11-16 DIAGNOSIS — G8111 Spastic hemiplegia affecting right dominant side: Secondary | ICD-10-CM | POA: Diagnosis not present

## 2015-11-16 DIAGNOSIS — IMO0002 Reserved for concepts with insufficient information to code with codable children: Secondary | ICD-10-CM

## 2015-11-16 DIAGNOSIS — E1122 Type 2 diabetes mellitus with diabetic chronic kidney disease: Secondary | ICD-10-CM | POA: Diagnosis not present

## 2015-11-16 DIAGNOSIS — I69320 Aphasia following cerebral infarction: Secondary | ICD-10-CM

## 2015-11-16 DIAGNOSIS — R51 Headache: Secondary | ICD-10-CM | POA: Diagnosis not present

## 2015-11-16 DIAGNOSIS — Z8673 Personal history of transient ischemic attack (TIA), and cerebral infarction without residual deficits: Secondary | ICD-10-CM | POA: Diagnosis not present

## 2015-11-16 DIAGNOSIS — I120 Hypertensive chronic kidney disease with stage 5 chronic kidney disease or end stage renal disease: Secondary | ICD-10-CM | POA: Insufficient documentation

## 2015-11-16 DIAGNOSIS — E875 Hyperkalemia: Secondary | ICD-10-CM | POA: Insufficient documentation

## 2015-11-16 DIAGNOSIS — I69920 Aphasia following unspecified cerebrovascular disease: Secondary | ICD-10-CM | POA: Diagnosis present

## 2015-11-16 DIAGNOSIS — N185 Chronic kidney disease, stage 5: Secondary | ICD-10-CM | POA: Insufficient documentation

## 2015-11-16 DIAGNOSIS — D5740 Sickle-cell thalassemia without crisis: Secondary | ICD-10-CM | POA: Insufficient documentation

## 2015-11-16 DIAGNOSIS — K859 Acute pancreatitis without necrosis or infection, unspecified: Secondary | ICD-10-CM | POA: Insufficient documentation

## 2015-11-16 DIAGNOSIS — Z86711 Personal history of pulmonary embolism: Secondary | ICD-10-CM | POA: Diagnosis not present

## 2015-11-16 DIAGNOSIS — G8110 Spastic hemiplegia affecting unspecified side: Secondary | ICD-10-CM | POA: Diagnosis not present

## 2015-11-16 MED FILL — BREO ELLIPTA 200-25 MCG INH: 200-25 | 30 days supply | Qty: 60 | Fill #0

## 2015-11-16 NOTE — Progress Notes (Signed)
Transition of care Subjective:    Patient ID: Isaiah [name], male    DOB: 1966/04/18, 50 y.o.   MRN: [id number] 50 year old RH-male with history of DM type 2 with diabetic, Sickle cell-thalassemia, B-PE 11/12, OSA, CKD stage V pending dialysis, anemia who was admitted to [facility] in [place] on 09/16/15 with N/V and blood stools with acute on chronic renal failure due to acute pancreatitis. He was treated with supportive care, IV bicarb as well as multiple units PRBC and EGD with severe stage IV erosive esophagitis with gastritis.  Noted to have abnormal LFTs and Hep A, Hep B and Hep C panels negative. HD initiated on 01/09 due to poor recover with decrease in UOP and currently ongoing on MWF.  He developed hypoxic respiratory failure requiring BIPAP? and was found to have RUL/RML PE as well as left popliteal DVT. He was also found to have left retrocardiac PNA and treated with IV antibiotics as well as IV heparin.   Elevated troponin's felt to be due to demand ischemia due to PE and was treated with BB and ASA per cardiology input.  IVC filter was placed and post procedure was found to have right hand weakness.   CT head reportedly showed subacute to old strokes. Neurology was consulted for input and EMG showed evidence of profound neuropathy. He had worsening of symptoms with increase in right sided weakness, facial droop speech difficulty on 09/27/15. MRI on 01/21 showed  Scattered foci of cortical, cerebellar and deep gray matter infarcts with mild petechial hemorrhage and developing laminar necrosis.   On 01/22, he had worsening of aphasia and repeat MRI brain 1/22 showed new  Restricted diffusion in left temporal and larger more prominent area in posterior left frontal/ perirolandic area involving motor strip in addition to numerous scattered infarcts in bilateral cerebral and cerebellar hemispheres. MRA without significant stenosis. Echo with positive bubble  study and TEE was  positive for PFO. Hypercoagulation studies reported to be negative. IV heparin was changed to Lovenox due to recurrent embolic strokes on heparin. Swallow evaluation with moderate oral and mild pharyngeal dysphagia and MBS without aspiration. He was started on puree diet with thin liquids Admit date: 10/12/2015 Discharge date: 11/01/2015 HPI Patient is at home receiving home health PT OT and speech therapy. He has followed up with vascular surgery as well as cardiology. There is plan for L to do  Plan for admission 12/03/2015 to be on IV heparin prior to AV fistula placement on 12/04/2013 by Dr. Juanda [place].  Patient is scheduled for home health therapy until 12/01/2015.  Discussed outpatient therapy, has transportation. Would have to work around dialysis scheduled. Currently dialysis's on Monday Wednesday and Fridays  Pain Inventory Average Pain 0 Pain Right Now 0 My pain is no pain  In the last 24 hours, has pain interfered with the following? General activity 0 Relation with others 0 Enjoyment of life 0 What TIME of day is your pain at its worst? no pain Sleep (in general) Good  Pain is worse with: no pain Pain improves with: no pain Relief from Meds: 0 no pain Mobility use a cane  Function I need assistance with the following:  dressing and bathing  Neuro/Psych No problems in this area  Prior Studies Any changes since last visit?  no  Physicians involved in your care Any changes since last visit?  no   Family History  Problem Relation Age of Onset  . Hypertension Mother   .  Diabetes Mother   . Sickle cell anemia Brother   . Diabetes type I Brother   . Kidney disease Brother    Social History   Social History  . Marital Status: Married    Spouse Name: sylvia  . Number of Children: 1  . Years of Education: college   Occupational History  . [place] History Main Topics  . Smoking status: Never Smoker   .  Smokeless tobacco: Never Used  . Alcohol Use: No  . Drug Use: No  . Sexual Activity: Yes   Other Topics Concern  . None   Social History Narrative   ** Merged History Encounter **       Past Surgical History  Procedure Laterality Date  . Cholecystectomy    . Bone marrow biopsy    . Other surgical history      Retinal surgery  . Pars plana vitrectomy  02/17/2012    Procedure: PARS PLANA VITRECTOMY WITH 25 GAUGE;  Surgeon: Hayden Pedro, MD;  Location: [facility];  Service: Ophthalmology;  Laterality: Right;  . Cholecystectomy  1990's?  . Eye surgery Right   . Insertion of dialysis catheter Right 10/16/2015    Procedure: INSERTION OF PALINDROME DIALYSIS CATHETER ;  Surgeon: Elam Dutch, MD;  Location: [facility];  Service: Vascular;  Laterality: Right;   Past Medical History  Diagnosis Date  . Diabetes mellitus without complication ([place])   . Sickle cell-thalassemia disease ([place])     a. Sickle cell trait.  . Pulmonary embolism ([place]) 07/2011    a. Tx with Coumadin for 6 months (unknown cause per patient).  . Hyperkalemia 07/2011  . Acute renal failure ([place]) 07/2011  . Monoclonal gammopathy   . Sleep apnea     a. uses CPAP.  [name] Hypertension   . Gout   . Pulmonary embolism ([place]) 07/2011; 09/27/2014    a. Bilat PE 07/2011 - unclear cause, tx with 6 months Coumadin.;   . Monoclonal gammopathies   . Anemia   . Asthma   . OSA on CPAP   . History of recent blood transfusion 10/27/14    2 Units PRBC's  . ESRD (end stage renal disease) on dialysis (H[facility])    BP 110/79 mmHg  Pulse 86  SpO2 98%  Opioid Risk Score:   Fall Risk Score:  `1  Depression screen PHQ 2/9  Depression screen [facility] 2/9 11/16/2015 03/22/2015  Decreased Interest 0 0  Down, Depressed, Hopeless 0 0  PHQ - 2 Score 0 0  Altered sleeping 0 -  Tired, decreased energy 0 -  Change in appetite 0 -  Feeling bad or failure about yourself  0 -  Trouble concentrating 0 -  Moving slowly or fidgety/restless 0 -  Suicidal  thoughts 0 -  PHQ-9 Score 0 -     Review of Systems  All other systems reviewed and are negative.      Objective:   Physical Exam  Constitutional: He is oriented to person, place, and time. He appears well-developed.  HENT:  Head: Normocephalic and atraumatic.  Eyes: Conjunctivae and EOM are normal. Pupils are equal, round, and reactive to light.  Neck: Normal range of motion.  Cardiovascular: Normal rate, regular rhythm and normal heart sounds.   Pulmonary/Chest: Effort normal and breath sounds normal.  Abdominal: Soft.  Neurological: He is alert and oriented to person, place, and time.  Psychiatric: He has a normal mood and affect.  Nursing note  and vitals reviewed.   Motor strength is 3+ in the right deltoid, biceps, triceps, grip 4 at the hip flexor and knee extensor and ankle dorsiflexor on the right  5/5 on the left side Patient reports normal sensation in the upper and lower limbs. Speech has a aphasia decreased word finding as well as fluency but is able to speak at a short sentence level.  Patient has no tenderness over his parotid gland No tenderness over the TMJ no pain with oral movements. Tongue appears unremarkable, teeth and gums on the right side of his face upper and lower up are normal      Assessment & Plan:  Right spastic hemiparesis affecting the upper extremity greater than lower extremity, has had good improvementWith his strength over the last month. Would recommend continue home health therapy for about 2 more weeks before he goes to the hospital for his AV fistula. Recommend starting outpatient PT and OT after his hospitalization which will be proximally one week. This would be after April 3 We will need to do outpatient therapy on Tuesdays and Thursdays because of his Monday Wednesday Friday dialysis schedule  2. Expressive aphasia related to stroke , left MCA infarct will institute speech therapy as above in addition to the PT and OT  3. Right  facial pain exam is unremarkable, I do not think it is a dysesthesia related to his stroke although this is possible. I would expect that he would have upper extremity symptoms as well. I recommend dental consult. He has an appointment coming up soon  Filled out form to document that patient's mother had to miss her flight due to his serious medical illness

## 2015-11-16 NOTE — Patient Instructions (Addendum)
Follow-up with dentist in regards to facial pain  Recommend therapy twice a week on the outpatient side starting after April 3 after his hospitalization. Should be on Tuesdays and Thursdays so that it does not interfere with dialysis which is on Monday Wednesday Friday

## 2015-11-19 ENCOUNTER — Telehealth: Payer: Self-pay | Admitting: Cardiovascular Disease

## 2015-11-19 NOTE — Telephone Encounter (Signed)
Spoke with Larkin Ina in the Non-Invasive Department at [facility] he will be making CD of  TEE and also Echo on CD and placing in mail today.

## 2015-11-21 ENCOUNTER — Telehealth: Payer: Self-pay | Admitting: Physical Medicine & Rehabilitation

## 2015-11-21 ENCOUNTER — Telehealth: Payer: Self-pay | Admitting: Cardiovascular Disease

## 2015-11-21 NOTE — Telephone Encounter (Signed)
CD received From [place] back Thursday will give to them then.

## 2015-11-21 NOTE — Telephone Encounter (Signed)
Verbal orders given per office protocol.

## 2015-11-21 NOTE — Telephone Encounter (Signed)
Alonna Minium ST with [facility] needs verbal to add an additional visit  to next week she wants to see him 2w1 next week., due to missed visit last week.

## 2015-11-27 DIAGNOSIS — I10 Essential (primary) hypertension: Secondary | ICD-10-CM | POA: Diagnosis not present

## 2015-11-29 MED FILL — WARFARIN SODIUM 5 MG TABLET: 5 | 30 days supply | Qty: 45 | Fill #0

## 2015-12-03 ENCOUNTER — Inpatient Hospital Stay (HOSPITAL_COMMUNITY)
Admission: AD | Admit: 2015-12-03 | Discharge: 2015-12-10 | DRG: 673 | Disposition: A | Discharge status: Home Health | Payer: BLUE CROSS/BLUE SHIELD | Source: Ambulatory Visit | Attending: Vascular Surgery | Admitting: Vascular Surgery

## 2015-12-03 DIAGNOSIS — Z6828 Body mass index (BMI) 28.0-28.9, adult: Secondary | ICD-10-CM

## 2015-12-03 DIAGNOSIS — N2581 Secondary hyperparathyroidism of renal origin: Secondary | ICD-10-CM | POA: Diagnosis present

## 2015-12-03 DIAGNOSIS — I69331 Monoplegia of upper limb following cerebral infarction affecting right dominant side: Secondary | ICD-10-CM

## 2015-12-03 DIAGNOSIS — Z7982 Long term (current) use of aspirin: Secondary | ICD-10-CM

## 2015-12-03 DIAGNOSIS — Z8249 Family history of ischemic heart disease and other diseases of the circulatory system: Secondary | ICD-10-CM

## 2015-12-03 DIAGNOSIS — Z992 Dependence on renal dialysis: Secondary | ICD-10-CM | POA: Diagnosis not present

## 2015-12-03 DIAGNOSIS — Z86718 Personal history of other venous thrombosis and embolism: Secondary | ICD-10-CM

## 2015-12-03 DIAGNOSIS — Z86711 Personal history of pulmonary embolism: Secondary | ICD-10-CM | POA: Diagnosis not present

## 2015-12-03 DIAGNOSIS — Z7901 Long term (current) use of anticoagulants: Secondary | ICD-10-CM

## 2015-12-03 DIAGNOSIS — Z841 Family history of disorders of kidney and ureter: Secondary | ICD-10-CM | POA: Diagnosis not present

## 2015-12-03 DIAGNOSIS — Z79899 Other long term (current) drug therapy: Secondary | ICD-10-CM

## 2015-12-03 DIAGNOSIS — G4733 Obstructive sleep apnea (adult) (pediatric): Secondary | ICD-10-CM | POA: Diagnosis present

## 2015-12-03 DIAGNOSIS — Z885 Allergy status to narcotic agent status: Secondary | ICD-10-CM | POA: Diagnosis not present

## 2015-12-03 DIAGNOSIS — Z832 Family history of diseases of the blood and blood-forming organs and certain disorders involving the immune mechanism: Secondary | ICD-10-CM

## 2015-12-03 DIAGNOSIS — Z794 Long term (current) use of insulin: Secondary | ICD-10-CM | POA: Diagnosis not present

## 2015-12-03 DIAGNOSIS — M899 Disorder of bone, unspecified: Secondary | ICD-10-CM | POA: Diagnosis present

## 2015-12-03 DIAGNOSIS — D5740 Sickle-cell thalassemia without crisis: Secondary | ICD-10-CM | POA: Diagnosis present

## 2015-12-03 DIAGNOSIS — Z833 Family history of diabetes mellitus: Secondary | ICD-10-CM

## 2015-12-03 DIAGNOSIS — I120 Hypertensive chronic kidney disease with stage 5 chronic kidney disease or end stage renal disease: Secondary | ICD-10-CM | POA: Diagnosis present

## 2015-12-03 DIAGNOSIS — M109 Gout, unspecified: Secondary | ICD-10-CM | POA: Diagnosis present

## 2015-12-03 DIAGNOSIS — N186 End stage renal disease: Secondary | ICD-10-CM | POA: Diagnosis not present

## 2015-12-03 DIAGNOSIS — E1122 Type 2 diabetes mellitus with diabetic chronic kidney disease: Secondary | ICD-10-CM | POA: Diagnosis present

## 2015-12-03 DIAGNOSIS — N185 Chronic kidney disease, stage 5: Secondary | ICD-10-CM | POA: Diagnosis not present

## 2015-12-03 DIAGNOSIS — D649 Anemia, unspecified: Secondary | ICD-10-CM | POA: Diagnosis not present

## 2015-12-03 LAB — PROTIME-INR
INR: 2.25 — ABNORMAL HIGH (ref 0.00–1.49)
Prothrombin Time: 24.6 seconds — ABNORMAL HIGH (ref 11.6–15.2)

## 2015-12-03 LAB — URINALYSIS, ROUTINE W REFLEX MICROSCOPIC
Bilirubin Urine: NEGATIVE
Glucose, UA: 100 mg/dL — AB
Hgb urine dipstick: NEGATIVE
Ketones, ur: NEGATIVE mg/dL
Leukocytes, UA: NEGATIVE
Nitrite: NEGATIVE
Protein, ur: 100 mg/dL — AB
Specific Gravity, Urine: 1.009 (ref 1.005–1.030)
pH: 5.5 (ref 5.0–8.0)

## 2015-12-03 LAB — CBC
HCT: 29.2 % — ABNORMAL LOW (ref 39.0–52.0)
Hemoglobin: 10.6 g/dL — ABNORMAL LOW (ref 13.0–17.0)
MCH: 28.6 pg (ref 26.0–34.0)
MCHC: 36.3 g/dL — ABNORMAL HIGH (ref 30.0–36.0)
MCV: 78.7 fL (ref 78.0–100.0)
Platelets: 437 10*3/uL — ABNORMAL HIGH (ref 150–400)
RBC: 3.71 MIL/uL — ABNORMAL LOW (ref 4.22–5.81)
RDW: 17.2 % — ABNORMAL HIGH (ref 11.5–15.5)
WBC: 14.1 10*3/uL — ABNORMAL HIGH (ref 4.0–10.5)

## 2015-12-03 LAB — GLUCOSE, CAPILLARY
Glucose-Capillary: 221 mg/dL — ABNORMAL HIGH (ref 65–99)
Glucose-Capillary: 100 mg/dL — ABNORMAL HIGH (ref 65–99)
Glucose-Capillary: 122 mg/dL — ABNORMAL HIGH (ref 65–99)

## 2015-12-03 LAB — COMPREHENSIVE METABOLIC PANEL
ALT: 11 U/L — ABNORMAL LOW (ref 17–63)
AST: 13 U/L — ABNORMAL LOW (ref 15–41)
Albumin: 3.3 g/dL — ABNORMAL LOW (ref 3.5–5.0)
Alkaline Phosphatase: 89 U/L (ref 38–126)
Anion gap: 13 (ref 5–15)
BUN: 52 mg/dL — ABNORMAL HIGH (ref 6–20)
CO2: 23 mmol/L (ref 22–32)
Calcium: 8.7 mg/dL — ABNORMAL LOW (ref 8.9–10.3)
Chloride: 99 mmol/L — ABNORMAL LOW (ref 101–111)
Creatinine, Ser: 7.03 mg/dL — ABNORMAL HIGH (ref 0.61–1.24)
GFR calc Af Amer: 9 mL/min — ABNORMAL LOW (ref >60)
GFR calc non Af Amer: 8 mL/min — ABNORMAL LOW (ref >60)
Glucose, Bld: 221 mg/dL — ABNORMAL HIGH (ref 65–99)
Potassium: 2.7 mmol/L — CL (ref 3.5–5.1)
Sodium: 135 mmol/L (ref 135–145)
Total Bilirubin: 1.4 mg/dL — ABNORMAL HIGH (ref 0.3–1.2)
Total Protein: 7.6 g/dL (ref 6.5–8.1)

## 2015-12-03 LAB — URINE MICROSCOPIC-ADD ON: RBC / HPF: NONE SEEN RBC/hpf (ref 0–5)

## 2015-12-03 MED ORDER — LIDOCAINE-PRILOCAINE 2.5-2.5 % EX CREA
1.0000 "application " | TOPICAL_CREAM | CUTANEOUS | Status: DC | PRN
  Filled 2015-12-03: qty 5

## 2015-12-03 MED ORDER — POTASSIUM CHLORIDE CRYS ER 20 MEQ PO TBCR
20.0000 meq | EXTENDED_RELEASE_TABLET | Freq: Once | ORAL | Status: AC
  Administered 2015-12-03: 40 meq via ORAL
  Filled 2015-12-03: qty 2

## 2015-12-03 MED ORDER — RENA-VITE PO TABS
1.0000 | ORAL_TABLET | Freq: Every day | ORAL | Status: DC
  Administered 2015-12-04 – 2015-12-09 (×6): 1 via ORAL
  Filled 2015-12-03 (×6): qty 1

## 2015-12-03 MED ORDER — METOPROLOL TARTRATE 1 MG/ML IV SOLN: 2.0000 mg | INTRAVENOUS | Status: DC | PRN

## 2015-12-03 MED ORDER — SODIUM CHLORIDE 0.9 % IV SOLN: 100.0000 mL | INTRAVENOUS | Status: DC | PRN

## 2015-12-03 MED ORDER — INSULIN ASPART 100 UNIT/ML ~~LOC~~ SOLN
0.0000 [IU] | Freq: Three times a day (TID) | SUBCUTANEOUS | Status: DC
  Administered 2015-12-03: 5 [IU] via SUBCUTANEOUS
  Administered 2015-12-04: 2 [IU] via SUBCUTANEOUS
  Administered 2015-12-04: 5 [IU] via SUBCUTANEOUS
  Administered 2015-12-05: 2 [IU] via SUBCUTANEOUS
  Administered 2015-12-06: 5 [IU] via SUBCUTANEOUS
  Administered 2015-12-06 – 2015-12-07 (×2): 3 [IU] via SUBCUTANEOUS
  Administered 2015-12-08 – 2015-12-09 (×2): 2 [IU] via SUBCUTANEOUS
  Administered 2015-12-09: 3 [IU] via SUBCUTANEOUS

## 2015-12-03 MED ORDER — LIDOCAINE HCL (PF) 1 % IJ SOLN: 5.0000 mL | INTRAMUSCULAR | Status: DC | PRN

## 2015-12-03 MED ORDER — MIDODRINE HCL 5 MG PO TABS
2.5000 mg | ORAL_TABLET | Freq: Two times a day (BID) | ORAL | Status: DC
Start: 2015-12-03 — End: 2015-12-10
  Administered 2015-12-03 – 2015-12-09 (×12): 2.5 mg via ORAL
  Filled 2015-12-03 (×9): qty 1

## 2015-12-03 MED ORDER — FLUTICASONE FUROATE-VILANTEROL 100-25 MCG/INH IN AEPB
1.0000 | INHALATION_SPRAY | Freq: Every day | RESPIRATORY_TRACT | Status: DC
  Administered 2015-12-04 – 2015-12-10 (×6): 1 via RESPIRATORY_TRACT
  Filled 2015-12-03 (×2): qty 28

## 2015-12-03 MED ORDER — CALCITRIOL 0.5 MCG PO CAPS
0.5000 ug | ORAL_CAPSULE | ORAL | Status: DC
  Administered 2015-12-05 – 2015-12-10 (×3): 0.5 ug via ORAL
  Filled 2015-12-03 (×2): qty 1

## 2015-12-03 MED ORDER — ACETAMINOPHEN 650 MG RE SUPP: 325.0000 mg | RECTAL | Status: DC | PRN

## 2015-12-03 MED ORDER — PANTOPRAZOLE SODIUM 40 MG PO TBEC: 40.0000 mg | DELAYED_RELEASE_TABLET | Freq: Every day | ORAL | Status: DC

## 2015-12-03 MED ORDER — PENTAFLUOROPROP-TETRAFLUOROETH EX AERO: 1.0000 "application " | INHALATION_SPRAY | CUTANEOUS | Status: DC | PRN

## 2015-12-03 MED ORDER — PHENOL 1.4 % MT LIQD: 1.0000 | OROMUCOSAL | Status: DC | PRN

## 2015-12-03 MED ORDER — INSULIN GLARGINE 100 UNIT/ML ~~LOC~~ SOLN
25.0000 [IU] | Freq: Every day | SUBCUTANEOUS | Status: DC
  Administered 2015-12-04 – 2015-12-08 (×5): 25 [IU] via SUBCUTANEOUS
  Filled 2015-12-03 (×9): qty 0.25

## 2015-12-03 MED ORDER — HEPARIN SODIUM (PORCINE) 1000 UNIT/ML DIALYSIS
1000.0000 [IU] | Freq: Once | INTRAMUSCULAR | Status: DC
Start: 2015-12-03 — End: 2015-12-10
  Filled 2015-12-03: qty 1

## 2015-12-03 MED ORDER — ASPIRIN EC 81 MG PO TBEC
81.0000 mg | DELAYED_RELEASE_TABLET | Freq: Every day | ORAL | Status: DC
  Administered 2015-12-03 – 2015-12-10 (×7): 81 mg via ORAL
  Filled 2015-12-03 (×7): qty 1

## 2015-12-03 MED ORDER — GUAIFENESIN-DM 100-10 MG/5ML PO SYRP: 15.0000 mL | ORAL_SOLUTION | ORAL | Status: DC | PRN

## 2015-12-03 MED ORDER — ACETAMINOPHEN 325 MG PO TABS: 325.0000 mg | ORAL_TABLET | ORAL | Status: DC | PRN

## 2015-12-03 MED ORDER — ONDANSETRON HCL 4 MG/2ML IJ SOLN: 4.0000 mg | Freq: Four times a day (QID) | INTRAMUSCULAR | Status: DC | PRN

## 2015-12-03 MED ORDER — LORATADINE 10 MG PO TABS: 10.0000 mg | ORAL_TABLET | ORAL | Status: DC | PRN

## 2015-12-03 MED ORDER — SODIUM CHLORIDE 0.9 % IV SOLN
100.0000 mL | INTRAVENOUS | Status: DC | PRN
  Administered 2015-12-05 (×2): via INTRAVENOUS

## 2015-12-03 MED ORDER — HYDRALAZINE HCL 20 MG/ML IJ SOLN: 5.0000 mg | INTRAMUSCULAR | Status: DC | PRN

## 2015-12-03 MED ORDER — LABETALOL HCL 5 MG/ML IV SOLN: 10.0000 mg | INTRAVENOUS | Status: DC | PRN

## 2015-12-03 MED ORDER — SODIUM CHLORIDE 0.9 % IV SOLN
INTRAVENOUS | Status: DC
  Administered 2015-12-04 – 2015-12-06 (×3): via INTRAVENOUS

## 2015-12-03 MED ORDER — HEPARIN SODIUM (PORCINE) 1000 UNIT/ML DIALYSIS: 1000.0000 [IU] | INTRAMUSCULAR | Status: DC | PRN

## 2015-12-03 MED ORDER — PANTOPRAZOLE SODIUM 40 MG PO TBEC
40.0000 mg | DELAYED_RELEASE_TABLET | Freq: Two times a day (BID) | ORAL | Status: DC
  Administered 2015-12-03 – 2015-12-10 (×13): 40 mg via ORAL
  Filled 2015-12-03 (×13): qty 1

## 2015-12-03 MED ORDER — ALUM & MAG HYDROXIDE-SIMETH 200-200-20 MG/5ML PO SUSP: 15.0000 mL | ORAL | Status: DC | PRN

## 2015-12-03 MED ORDER — ALTEPLASE 2 MG IJ SOLR: 2.0000 mg | Freq: Once | INTRAMUSCULAR | Status: DC | PRN

## 2015-12-03 MED FILL — MIDODRINE HCL 2.5 MG TABLET: 2.5 | 30 days supply | Qty: 60 | Fill #1 | Status: TO

## 2015-12-03 MED FILL — PANTOPRAZOLE SOD DR 40 MG T: 40 | 30 days supply | Qty: 60 | Fill #1 | Status: TO

## 2015-12-03 NOTE — Progress Notes (Signed)
Pt arrived on the unit on a wheelchair from home. Pt oriented to room and staff, assessment completed see flowsheet, call light within reach will continue to monitor.

## 2015-12-03 NOTE — Progress Notes (Signed)
Ruta Hinds MD paged about pts request, will continue to monitor.

## 2015-12-03 NOTE — Consult Note (Signed)
[facility] Renal Consultation Note    Indication for Consultation:  Management of ESRD/hemodialysis; anemia, hypertension/volume and secondary hyperparathyroidism PCP:  HPI: Isaiah [name] is a 50 y.o. male with ESRD on hemodialysis at [name] MWF. He is a recently started HD approximately 2 months ago. Past medical history significant for DM, hypertension, PE, DVT, sickle cell trait, OSA on CPAP, monoclonal gammopathy, anemia of chronic disease, secondary hyperparathyroidism. He is being admitted per Dr. Oneida Alar for management of anticoagulation prior to insertion of AVF per Dr. Oneida Alar. He has been on coumadin for DVT/PE. He is transitioning from coumadin to heparin prior to surgery. Currently denies C/O chest pain, SOB, fever, chills, NVD, headache, malaise, abdominal pain, back pain, flank pain. "I'm fine-I'm just here for my fistula".   He has recently removed to [place] from [place], [place]. He has HD at [name]. He is compliant to HD prescription. Last in-center lab values as follows: HGB 10.5 HCT 31.5 PT/INR 27.5/2.58 (11/28/15) Plt 443 Fe 82 TIBC 272 T sat 30 Ca 9.1 C Ca 9.3 Phos 5.1 (11/14/15) PTH 600 (11/23/15)   Past Medical History  Diagnosis Date  . Diabetes mellitus without complication ([place])   . Sickle cell-thalassemia disease ([place])     a. Sickle cell trait.  . Pulmonary embolism ([place]) 07/2011    a. Tx with Coumadin for 6 months (unknown cause per patient).  . Hyperkalemia 07/2011  . Acute renal failure ([place]) 07/2011  . Monoclonal gammopathy   . Sleep apnea     a. uses CPAP.  [name] Hypertension   . Gout   . Pulmonary embolism ([place]) 07/2011; 09/27/2014    a. Bilat PE 07/2011 - unclear cause, tx with 6 months Coumadin.;   . Monoclonal gammopathies   . Anemia   . Asthma   . OSA on CPAP   . History of recent blood transfusion 10/27/14    2 Units PRBC's  . ESRD (end stage renal disease) on dialysis [facility])    Past Surgical History  Procedure Laterality  Date  . Cholecystectomy    . Bone marrow biopsy    . Other surgical history      Retinal surgery  . Pars plana vitrectomy  02/17/2012    Procedure: PARS PLANA VITRECTOMY WITH 25 GAUGE;  Surgeon: Hayden Pedro, MD;  Location: [facility];  Service: Ophthalmology;  Laterality: Right;  . Cholecystectomy  1990's?  . Eye surgery Right   . Insertion of dialysis catheter Right 10/16/2015    Procedure: INSERTION OF PALINDROME DIALYSIS CATHETER ;  Surgeon: Elam Dutch, MD;  Location: [facility];  Service: Vascular;  Laterality: Right;   Family History  Problem Relation Age of Onset  . Hypertension Mother   . Diabetes Mother   . Sickle cell anemia Brother   . Diabetes type I Brother   . Kidney disease Brother    Social History:  reports that he has never smoked. He has never used smokeless tobacco. He reports that he does not drink alcohol or use illicit drugs. Allergies  Allergen Reactions  . Codeine Rash and Other (See Comments)    Unknown reaction (patient says it was more serious than just a rash, but he can't remember what happened)   Prior to Admission medications   Medication Sig Start Date End Date Taking? Authorizing Provider  aspirin EC 81 MG tablet Take 1 tablet (81 mg total) by mouth daily. 11/02/15  Yes Ivan Anchors Love, PA-C  epoetin alfa (EPOGEN,PROCRIT) 24235 UNIT/ML  injection Inject 5,000 Units into the skin once a week.  09/06/15  Yes Historical Provider, MD  fluticasone furoate-vilanterol (BREO ELLIPTA) 100-25 MCG/INH AEPB Inhale 1 puff into the lungs daily. 11/02/15  Yes Ivan Anchors Love, PA-C  Insulin Glargine (LANTUS) 100 UNIT/ML Solostar Pen Inject 25 Units into the skin daily at 10 pm. 11/02/15  Yes Ivan Anchors Love, PA-C  midodrine (PROAMATINE) 2.5 MG tablet Take 1 tablet (2.5 mg total) by mouth 2 (two) times daily with a meal. 11/02/15  Yes Ivan Anchors Love, PA-C  multivitamin (RENA-VIT) TABS tablet Take 1 tablet by mouth at bedtime. 11/02/15  Yes Ivan Anchors Love, PA-C  pantoprazole  (PROTONIX) 40 MG tablet Take 1 tablet (40 mg total) by mouth 2 (two) times daily. 11/02/15  Yes Ivan Anchors Love, PA-C  cetirizine (ZYRTEC) 10 MG tablet Take 10 mg by mouth as needed for allergies.     Historical Provider, MD  NEEDLE, DISP, 27 G (BD DISP NEEDLES) 27G X 1/2" MISC 1 application by Does not apply route daily after supper. 11/02/15   Bary Leriche, PA-C  warfarin (COUMADIN) 5 MG tablet Take 1.5 tablets (7.5 mg total) by mouth daily at 6 PM. 11/02/15   Bary Leriche, PA-C   Current Facility-Administered Medications  Medication Dose Route Frequency Provider Last Rate Last Dose  . 0.9 %  sodium chloride infusion   Intravenous Continuous Ulyses Amor, PA-C      . acetaminophen (TYLENOL) tablet 325-650 mg  325-650 mg Oral Q4H PRN Ulyses Amor, PA-C       Or  . acetaminophen (TYLENOL) suppository 325-650 mg  325-650 mg Rectal Q4H PRN Ulyses Amor, PA-C      . alum & mag hydroxide-simeth (MAALOX/MYLANTA) 200-200-20 MG/5ML suspension 15-30 mL  15-30 mL Oral Q2H PRN Ulyses Amor, PA-C      . aspirin EC tablet 81 mg  81 mg Oral Daily Ulyses Amor, PA-C   81 mg at 12/03/15 1339  . [START ON 12/04/2015] fluticasone furoate-vilanterol (BREO ELLIPTA) 100-25 MCG/INH 1 puff  1 puff Inhalation Daily Ulyses Amor, PA-C      . guaiFENesin-dextromethorphan (ROBITUSSIN DM) 100-10 MG/5ML syrup 15 mL  15 mL Oral Q4H PRN Ulyses Amor, PA-C      . hydrALAZINE (APRESOLINE) injection 5 mg  5 mg Intravenous Q20 Min PRN Ulyses Amor, PA-C      . insulin aspart (novoLOG) injection 0-15 Units  0-15 Units Subcutaneous TID WC Ulyses Amor, PA-C   5 Units at 12/03/15 1335  . insulin glargine (LANTUS) injection 25 Units  25 Units Subcutaneous Q2200 Ulyses Amor, PA-C      . labetalol (NORMODYNE,TRANDATE) injection 10 mg  10 mg Intravenous Q10 min PRN Ulyses Amor, PA-C      . loratadine (CLARITIN) tablet 10 mg  10 mg Oral PRN Ulyses Amor, PA-C      . metoprolol (LOPRESSOR) injection 2-5 mg  2-5 mg  Intravenous Q2H PRN Ulyses Amor, PA-C      . midodrine (PROAMATINE) tablet 2.5 mg  2.5 mg Oral BID WC Ulyses Amor, PA-C      . multivitamin (RENA-VIT) tablet 1 tablet  1 tablet Oral QHS Ulyses Amor, PA-C      . ondansetron [facility]) injection 4 mg  4 mg Intravenous Q6H PRN Ulyses Amor, PA-C      . pantoprazole (PROTONIX) EC tablet 40 mg  40 mg Oral BID Terrence Dupont  Lennie Muckle, PA-C   40 mg at 12/03/15 1339  . phenol (CHLORASEPTIC) mouth spray 1 spray  1 spray Mouth/Throat PRN Ulyses Amor, PA-C       Labs: Basic Metabolic Panel:  Recent Labs Lab 12/03/15 1207  NA 135  K 2.7*  CL 99*  CO2 23  GLUCOSE 221*  BUN 52*  CREATININE 7.03*  CALCIUM 8.7*   Liver Function Tests:  Recent Labs Lab 12/03/15 1207  AST 13*  ALT 11*  ALKPHOS 89  BILITOT 1.4*  PROT 7.6  ALBUMIN 3.3*   No results for input(s): LIPASE, AMYLASE in the last 168 hours. No results for input(s): AMMONIA in the last 168 hours. CBC:  Recent Labs Lab 12/03/15 1207  WBC 14.1*  HGB 10.6*  HCT 29.2*  MCV 78.7  PLT 437*   Cardiac Enzymes: No results for input(s): CKTOTAL, CKMB, CKMBINDEX, TROPONINI in the last 168 hours. CBG:  Recent Labs Lab 12/03/15 1215  GLUCAP 221*   Iron Studies: No results for input(s): IRON, TIBC, TRANSFERRIN, FERRITIN in the last 72 hours. Studies/Results: No results found.  ROS: As per HPI otherwise negative.   Physical Exam: Filed Vitals:   12/03/15 1031 12/03/15 1453  BP: 114/73 118/75  Pulse: 87 83  Temp: 98 F (36.7 C) 97.7 F (36.5 C)  TempSrc: Oral Oral  Resp: 18 20  Height: 5' 10"  (1.778 m)   Weight: 90.5 kg (199 lb 8.3 oz)   SpO2: 100% 98%     General: Well developed, well nourished, in no acute distress. Head: Normocephalic, atraumatic, sclera non-icteric, mucus membranes are moist Neck: Supple. JVD not elevated. Lungs: Clear bilaterally to auscultation without wheezes, rales, or rhonchi. Breathing is unlabored. Heart: RRR with S1 S2. II/VI  systolic M.  Abdomen: Soft, non-tender, non-distended with normoactive bowel sounds. No rebound/guarding. No obvious abdominal masses. M-S:  Strength and tone appear normal for age. Lower extremities:without edema or ischemic changes, no open wounds  Neuro: Alert and oriented X 3. Moves all extremities spontaneously. Psych:  Responds to questions appropriately with a normal affect. Dialysis Access: R perm cath CDI  Dialysis Orders: Center: [facility]   MonWedFri, 4 hrs 15 min, 180NRe Optiflux, BFR 400, DFR Manual 800 mL/min, EDW 88 (kg), Dialysate 2.0 K, 2.25 Ca, UFR Profile: None, Sodium Model: None, Access: RIJ perm catheter tunneled Mircera: 75 mcg IV Q 2 weeks (last dose 11/21/15) Calcitriol: 0.25 mcg PO Q MWF   Assessment/Plan: 1.  AVF Placement: Per Dr. Oneida Alar. Plan to be placed Wednesday. Will plan for HD after surgery.  2.  ESRD -  MWF at [facility]. For HD today on schedule. K+ 2.7 use 4.0 K  3.  Hypertension/volume  - Current wt 90.5 KG. Attempt UFG 2-3 liters. On midodrine for BP support.  4.  Anemia  - HGB 10.6 Last ESA 11/21/14 5.  Metabolic bone disease - No binders, low dose VDRA.  6.  Nutrition - Albumin 3.3. Renal diet with fld restrictions. Renal vit 7. DM: per primary 8. H/O PE/DVT: to start heparin drip per pharmacy.   Rita H. Owens Shark, NP-C 12/03/2015, 3:01 PM  [employer] [phone]    I have seen and examined this patient and agree with plan as outlined by Juanell Fairly, NP-C. Governor Rooks Lashika Erker,MD 12/03/2015 3:53 PM

## 2015-12-03 NOTE — Progress Notes (Signed)
ANTICOAGULATION CONSULT NOTE - Initial Consult  Pharmacy Consult for Heparin  Indication: pulmonary embolus  Allergies  Allergen Reactions  . Codeine Rash and Other (See Comments)    Unknown reaction (patient says it was more serious than just a rash, but he can't remember what happened)    Patient Measurements: Height: 5' 10"  (177.8 cm) Weight: 199 lb 8.3 oz (90.5 kg) IBW/kg (Calculated) : 73 Heparin Dosing Weight: 90 kg   Vital Signs: Temp: 98 F (36.7 C) (03/27 1031) Temp Source: Oral (03/27 1031) BP: 114/73 mmHg (03/27 1031) Pulse Rate: 87 (03/27 1031)  Labs: No results for input(s): HGB, HCT, PLT, APTT, LABPROT, INR, HEPARINUNFRC, HEPRLOWMOCWT, CREATININE, CKTOTAL, CKMB, TROPONINI in the last 72 hours.  CrCl cannot be calculated (Patient has no serum creatinine result on file.).   Medical History: Past Medical History  Diagnosis Date  . Diabetes mellitus without complication ([place])   . Sickle cell-thalassemia disease ([place])     a. Sickle cell trait.  . Pulmonary embolism ([place]) 07/2011    a. Tx with Coumadin for 6 months (unknown cause per patient).  . Hyperkalemia 07/2011  . Acute renal failure ([place]) 07/2011  . Monoclonal gammopathy   . Sleep apnea     a. uses CPAP.  [name] Hypertension   . Gout   . Pulmonary embolism ([place]) 07/2011; 09/27/2014    a. Bilat PE 07/2011 - unclear cause, tx with 6 months Coumadin.;   . Monoclonal gammopathies   . Anemia   . Asthma   . OSA on CPAP   . History of recent blood transfusion 10/27/14    2 Units PRBC's  . ESRD (end stage renal disease) on dialysis [facility])     Medications:  Prescriptions prior to admission  Medication Sig Dispense Refill Last Dose  . aspirin EC 81 MG tablet Take 1 tablet (81 mg total) by mouth daily. 100 tablet 0 Taking  . cetirizine (ZYRTEC) 10 MG tablet Take 10 mg by mouth daily.    Taking  . epoetin alfa (EPOGEN,PROCRIT) 85885 UNIT/ML injection Inject 5,000 Units into the skin once a week.    Taking  .  fluticasone furoate-vilanterol (BREO ELLIPTA) 100-25 MCG/INH AEPB Inhale 1 puff into the lungs daily. 60 each 1 Taking  . Insulin Glargine (LANTUS) 100 UNIT/ML Solostar Pen Inject 25 Units into the skin daily at 10 pm. 15 mL 1 Taking  . midodrine (PROAMATINE) 2.5 MG tablet Take 1 tablet (2.5 mg total) by mouth 2 (two) times daily with a meal. 60 tablet 1 Taking  . multivitamin (RENA-VIT) TABS tablet Take 1 tablet by mouth at bedtime. 30 tablet 1 Taking  . NEEDLE, DISP, 27 G (BD DISP NEEDLES) 27G X 1/2" MISC 1 application by Does not apply route daily after supper. 100 each 0 Taking  . pantoprazole (PROTONIX) 40 MG tablet Take 1 tablet (40 mg total) by mouth 2 (two) times daily. 60 tablet 1 Taking  . warfarin (COUMADIN) 5 MG tablet Take 1.5 tablets (7.5 mg total) by mouth daily at 6 PM. (Patient taking differently: Take 1 mg by mouth daily at 6 PM. dose changes per INR) 45 tablet 1 Taking    Assessment: 29 YOM with h/o of PE on Coumadin pta to switch to IV heparin for insertion of IV graft on 3/29. Per patient, his last dose of Coumadin was on 3/25. INR therapeutic at 2.25   Goal of Therapy:  Heparin level 0.3-0.7 units/ml Monitor platelets by anticoagulation protocol: Yes   Plan:  -  Start IV heparin when INR < 2 -Monitor daily PT/INR   Albertina Parr, PharmD., BCPS Clinical Pharmacist Pager [phone]

## 2015-12-03 NOTE — H&P (Signed)
Vascular and Vein Specialist of [name]  Patient name: Isaiah Schaumburg HillsMRN: [id number] DOB: 1967/08/20Sex: male  REASON FOR VISIT: Permanent hemodialysis access HPI: Isaiah [name] is a 50 y.o. male who is seen in follow-up today for consideration of placement of a long-term hemodialysis access. The patient was initially seen as a consult in the hospital at the request of Dr. Florene Glen. The patient had had a stroke at that point from a paradoxical embolus from DVT. He is currently on Coumadin. He is dialyzing currently by catheter. Stroke affected his right arm and he has had some return of function in this arm. He is also having improvement of his speech. His current dialysis today is Monday Wednesday Friday.   Past Medical History  Diagnosis Date  . Diabetes mellitus without complication ([place])   . Sickle cell-thalassemia disease ([place])     a. Sickle cell trait.  . Pulmonary embolism ([place]) 07/2011    a. Tx with Coumadin for 6 months (unknown cause per patient).  . Hyperkalemia 07/2011  . Acute renal failure ([place]) 07/2011  . Monoclonal gammopathy   . Sleep apnea     a. uses CPAP.  [name] Hypertension   . Gout   . Pulmonary embolism ([place]) 07/2011; 09/27/2014    a. Bilat PE 07/2011 - unclear cause, tx with 6 months Coumadin.;   . Monoclonal gammopathies   . Anemia   . Asthma   . OSA on CPAP   . History of recent blood transfusion 10/27/14    2 Units PRBC's  . ESRD (end stage renal disease) on dialysis [facility])     Family History  Problem Relation Age of Onset  . Hypertension Mother   . Diabetes Mother   . Sickle cell anemia Brother   . Diabetes type I Brother   . Kidney disease Brother     SOCIAL HISTORY: Social History  Substance Use Topics  . Smoking status: Never Smoker   . Smokeless tobacco: Never Used  . Alcohol Use: No    Allergies  Allergen  Reactions  . Codeine Other (See Comments)    Pt unknown  . Codeine Rash and Other (See Comments)    Unknown reaction (patient says it was more serious than just a rash, but he can't remember what happened)    Current Outpatient Prescriptions  Medication Sig Dispense Refill  . aspirin EC 81 MG tablet Take 1 tablet (81 mg total) by mouth daily. 100 tablet 0  . cetirizine (ZYRTEC) 10 MG tablet Take 10 mg by mouth daily.     [name] epoetin alfa (EPOGEN,PROCRIT) 24580 UNIT/ML injection Inject 5,000 Units into the skin once a week.     . fluticasone furoate-vilanterol (BREO ELLIPTA) 100-25 MCG/INH AEPB Inhale 1 puff into the lungs daily. 60 each 1  . Insulin Glargine (LANTUS) 100 UNIT/ML Solostar Pen Inject 25 Units into the skin daily at 10 pm. 15 mL 1  . midodrine (PROAMATINE) 2.5 MG tablet Take 1 tablet (2.5 mg total) by mouth 2 (two) times daily with a meal. 60 tablet 1  . multivitamin (RENA-VIT) TABS tablet Take 1 tablet by mouth at bedtime. 30 tablet 1  . NEEDLE, DISP, 27 G (BD DISP NEEDLES) 27G X 1/2" MISC 1 application by Does not apply route daily after supper. 100 each 0  . pantoprazole (PROTONIX) 40 MG tablet Take 1 tablet (40 mg total) by mouth 2 (two) times daily. 60 tablet 1  . warfarin (COUMADIN) 5 MG tablet Take 1.5 tablets (7.5 mg  total) by mouth daily at 6 PM. 45 tablet 1   No current facility-administered medications for this visit.    REVIEW OF SYSTEMS:  [X]  denotes positive finding, [ ]  denotes negative finding Cardiac  Comments:  Chest pain or chest pressure:    Shortness of breath upon exertion:    Short of breath when lying flat:    Irregular heart rhythm:        Vascular    Pain in calf, thigh, or hip brought on by ambulation:    Pain in feet at night that wakes you up from your sleep:     Blood clot in your veins:    Leg swelling:         Pulmonary      Oxygen at home:    Productive cough:     Wheezing:         Neurologic    Sudden weakness in arms or legs:     Sudden numbness in arms or legs:     Sudden onset of difficulty speaking or slurred speech:    Temporary loss of vision in one eye:     Problems with dizziness:         Gastrointestinal    Blood in stool:     Vomited blood:         Genitourinary    Burning when urinating:     Blood in urine:        Psychiatric    Major depression:         Hematologic    Bleeding problems:    Problems with blood clotting too easily:        Skin    Rashes or ulcers:        Constitutional    Fever or chills:      PHYSICAL EXAM: Filed Vitals:   11/15/15 1048  BP: 112/78  Pulse: 84  Temp: 97.9 F (36.6 C)  Resp: 16  Height: 5' 10"  (1.778 m)  Weight: 197 lb (89.359 kg)  SpO2: 98%    GENERAL: The patient is a well-nourished male, in no acute distress. The vital signs are documented above. CARDIAC: There is a regular rate and rhythm.  VASCULAR: One plus right brachial and radial pulse PULMONARY: There is good air exchange bilaterally without wheezing or rales. ABDOMEN: Soft and non-tender with normal pitched bowel sounds.  MUSCULOSKELETAL: There are no major deformities or cyanosis. NEUROLOGIC: 3/5 motor strength right upper extremity some difficulty finding words for speech SKIN: There are no ulcers or rashes noted. PSYCHIATRIC: The patient has a normal affect.  DATA:  vein mapping ultrasound dated 11/08/2015 is reviewed today. Cephalic and basilic veins are quite small less than 2 mm in diameter throughout most of the course normal arterial configuration of the brachial and radial arteries with a 4 mm right brachial artery triphasic waveforms  MEDICAL ISSUES: Patient needs long-term hemodialysis access. We will schedule him for placement of a right upper  arm AV graft on March 29. He will stop his Coumadin on March 26. We will admit him for IV heparin on March 27 for bridging since he is high risk due to recent DVT and stroke. He will need to remain in the hospital postoperatively until he is fully quite dilated. Risks benefits possible palpitations and procedure details including but limited to bleeding infection graft thrombosis 25% chance 1 year were explained the patient today. He understands and agrees to proceed.  We will consult nephrology service during  his hospital admission for dialysis needs during his hospitalization.  Ruta Hinds Vascular and [employer]: [phone]

## 2015-12-03 NOTE — Progress Notes (Signed)
This RN paged Fields MD about critical lab value for potassium 2.7. Will continue to monitor

## 2015-12-04 ENCOUNTER — Telehealth: Payer: Self-pay | Admitting: Oncology

## 2015-12-04 LAB — GLUCOSE, CAPILLARY
Glucose-Capillary: 122 mg/dL — ABNORMAL HIGH (ref 65–99)
Glucose-Capillary: 116 mg/dL — ABNORMAL HIGH (ref 65–99)
Glucose-Capillary: 249 mg/dL — ABNORMAL HIGH (ref 65–99)
Glucose-Capillary: 158 mg/dL — ABNORMAL HIGH (ref 65–99)

## 2015-12-04 LAB — HEMOGLOBIN A1C
Hgb A1c MFr Bld: 6.4 % — ABNORMAL HIGH (ref 4.8–5.6)
Mean Plasma Glucose: 137 mg/dL

## 2015-12-04 LAB — PROTIME-INR
INR: 1.65 — ABNORMAL HIGH (ref 0.00–1.49)
Prothrombin Time: 19.5 seconds — ABNORMAL HIGH (ref 11.6–15.2)

## 2015-12-04 LAB — HEPARIN LEVEL (UNFRACTIONATED): Heparin Unfractionated: 0.57 IU/mL (ref 0.30–0.70)

## 2015-12-04 MED ORDER — DEXTROSE 5 % IV SOLN
1.5000 g | INTRAVENOUS | Status: AC
  Administered 2015-12-05: 1.5 g via INTRAVENOUS
  Filled 2015-12-04 (×2): qty 1.5

## 2015-12-04 MED ORDER — HEPARIN (PORCINE) IN NACL 100-0.45 UNIT/ML-% IJ SOLN
1600.0000 [IU]/h | INTRAMUSCULAR | Status: AC
  Administered 2015-12-04: 1600 [IU]/h via INTRAVENOUS
  Filled 2015-12-04 (×2): qty 250

## 2015-12-04 NOTE — Progress Notes (Signed)
ANTICOAGULATION CONSULT NOTE - Follow Up Consult  Pharmacy Consult for Heparin Indication: h/o PE  Allergies  Allergen Reactions  . Codeine Rash and Other (See Comments)    Unknown reaction (patient says it was more serious than just a rash, but he can't remember what happened)    Patient Measurements: Height: 5' 10"  (177.8 cm) Weight: 196 lb 3.4 oz (89 kg) (standing) IBW/kg (Calculated) : 73 Heparin Dosing Weight: 90 kg  Vital Signs: Temp: 98.2 F (36.8 C) (03/28 1341) Temp Source: Oral (03/28 1341) BP: 106/73 mmHg (03/28 1341) Pulse Rate: 94 (03/28 1341)  Labs:  Recent Labs  12/03/15 1207 12/04/15 0434 12/04/15 1431  HGB 10.6*  --   --   HCT 29.2*  --   --   PLT 437*  --   --   LABPROT 24.6* 19.5*  --   INR 2.25* 1.65*  --   HEPARINUNFRC  --   --  0.57  CREATININE 7.03*  --   --     Estimated Creatinine Clearance: 13.8 mL/min (by C-G formula based on Cr of 7.03).   Assessment: 33 YOM with h/o of PE on Coumadin PTA to switch to IV heparin for insertion of IV graft on 3/29. Per patient, his last dose of Coumadin was on 3/25. INR therapeutic at 2.25    Anticoagulation: Coumadin PTA. Held for IV graft insertion. To start heparin when INR<2. INR 1.6, First HL 0.57 in goal  Goal of Therapy:  Heparin level 0.3-0.7 units/ml Monitor platelets by anticoagulation protocol: Yes   Plan:  Heparin infusion 1600 units/hr Heparin off midnight per Dr. Oneida Alar' note Right arm AV access tomorrow   Ashlay Altieri S. Alford Highland, PharmD, BCPS Clinical Staff Pharmacist Pager [phone]  Eilene Ghazi Stillinger 12/04/2015,3:53 PM

## 2015-12-04 NOTE — Progress Notes (Signed)
Vascular and Vein Specialists of [place]  Subjective  - feels ok   Objective 121/82 98 98.1 F (36.7 C) (Oral) 16 100%  Intake/Output Summary (Last 24 hours) at 12/04/15 0816 Last data filed at 12/04/15 0220  Gross per 24 hour  Intake    720 ml  Output   2000 ml  Net  -1280 ml    Assessment/Planning: Right arm AV access tomorrow NPO p midnight Heparin off midnight  Ruta Hinds 12/04/2015 8:16 AM --  Laboratory Lab Results:  Recent Labs  12/03/15 1207  WBC 14.1*  HGB 10.6*  HCT 29.2*  PLT 437*   BMET  Recent Labs  12/03/15 1207  NA 135  K 2.7*  CL 99*  CO2 23  GLUCOSE 221*  BUN 52*  CREATININE 7.03*  CALCIUM 8.7*    COAG Lab Results  Component Value Date   INR 1.65* 12/04/2015   INR 2.25* 12/03/2015   INR 2.28* 11/02/2015   PROTIME 16.8* 10/30/2014   No results found for: PTT

## 2015-12-04 NOTE — Progress Notes (Signed)
Patient ID: [place], male   DOB: 08/02/1966, 50 y.o.   MRN: [id number]  [facility] Progress Note    Subjective:   Feels well no complaints   Objective:   BP 121/82 mmHg  Pulse 98  Temp(Src) 98.1 F (36.7 C) (Oral)  Resp 16  Ht 5' 10"  (1.778 m)  Wt 89 kg (196 lb 3.4 oz)  BMI 28.15 kg/m2  SpO2 100%  Intake/Output: I/O last 3 completed shifts: In: 84 [P.O.:720] Out: 2000 [Other:2000]   Intake/Output this shift:    Weight change:   Physical Exam: Gen:WD WN AAM in NAD CVS:no rub Resp:cta TUU:EKCMKL Ext:no edema  Labs: BMET  Recent Labs Lab 12/03/15 1207  NA 135  K 2.7*  CL 99*  CO2 23  GLUCOSE 221*  BUN 52*  CREATININE 7.03*  ALBUMIN 3.3*  CALCIUM 8.7*   CBC  Recent Labs Lab 12/03/15 1207  WBC 14.1*  HGB 10.6*  HCT 29.2*  MCV 78.7  PLT 437*    @IMGRELPRIORS @ Medications:    . aspirin EC  81 mg Oral Daily  . [START ON 12/05/2015] calcitRIOL  0.5 mcg Oral Q M,W,F-HD  . [START ON 12/05/2015] cefUROXime (ZINACEF)  IV  1.5 g Intravenous To SSTC  . fluticasone furoate-vilanterol  1 puff Inhalation Daily  . heparin  1,000 Units Dialysis Once in dialysis  . insulin aspart  0-15 Units Subcutaneous TID WC  . insulin glargine  25 Units Subcutaneous Q2200  . midodrine  2.5 mg Oral BID WC  . multivitamin  1 tablet Oral QHS  . pantoprazole  40 mg Oral BID   Dialysis Orders: Center: [facility]  MonWedFri, 4 hrs 15 min, 180NRe Optiflux, BFR 400, DFR Manual 800 mL/min, EDW 88 (kg), Dialysate 2.0 K, 2.25 Ca, UFR Profile: None, Sodium Model: None, Access: RIJ perm catheter tunneled Mircera: 75 mcg IV Q 2 weeks (last dose 11/21/15) Calcitriol: 0.25 mcg PO Q MWF   Assessment/ Plan:   1. PE/DVT- started on heparin bridge to prepare for surgery tomorrow, INR 1.65. 2. Vascular access- for AVF placement tomorrow 3. ESRD: continue with HD qMWF while he remains an inpatient 4. Anemia:on ESA 5. CKD-MBD:cont with binders 6. Nutrition:  stable 7. Hypertension:stable  Donetta Potts, MD [place] Pager [phone] 12/04/2015, 9:48 AM

## 2015-12-04 NOTE — Telephone Encounter (Signed)
Patient wife called and rescheduled 36/30 f/u to 4/25 due to patient in hospital. Wife has new date/time.

## 2015-12-04 NOTE — Progress Notes (Signed)
Pt receives PT, OT, and speech therapy at home. Pt's wife is concerned about pt receiving therapy during his hospitalization. Concern has been expressed to PA. Will continue to monitor.  Grant Fontana RN, BSN

## 2015-12-04 NOTE — Progress Notes (Signed)
ANTICOAGULATION CONSULT NOTE - Follow Up Consult  Pharmacy Consult for heparin Indication: h/o PE   Labs:  Recent Labs  12/03/15 1207 12/04/15 0434  HGB 10.6*  --   HCT 29.2*  --   PLT 437*  --   LABPROT 24.6* 19.5*  INR 2.25* 1.65*  CREATININE 7.03*  --      Assessment: 50yo male awaiting graft now w/ INR <2, to begin heparin bridge.  Goal of Therapy:  Heparin level 0.3-0.7 units/ml Monitor platelets by anticoagulation protocol: Yes   Plan:  Will start heparin gtt at 1600 units/hr (where pt was recently therapeutic) and monitor heparin levels and CBC.  Wynona Neat, PharmD, BCPS  12/04/2015,6:28 AM

## 2015-12-05 ENCOUNTER — Other Ambulatory Visit: Payer: Self-pay | Admitting: *Deleted

## 2015-12-05 ENCOUNTER — Telehealth: Payer: Self-pay

## 2015-12-05 ENCOUNTER — Encounter (HOSPITAL_COMMUNITY)
Admission: AD | Disposition: A | Discharge status: Home Health | Payer: Self-pay | Source: Ambulatory Visit | Attending: Vascular Surgery

## 2015-12-05 ENCOUNTER — Inpatient Hospital Stay (HOSPITAL_COMMUNITY): Payer: BLUE CROSS/BLUE SHIELD | Admitting: Anesthesiology

## 2015-12-05 ENCOUNTER — Encounter (HOSPITAL_COMMUNITY): Payer: Self-pay | Admitting: Anesthesiology

## 2015-12-05 ENCOUNTER — Telehealth: Payer: Self-pay | Admitting: Vascular Surgery

## 2015-12-05 DIAGNOSIS — Z4931 Encounter for adequacy testing for hemodialysis: Secondary | ICD-10-CM

## 2015-12-05 DIAGNOSIS — N185 Chronic kidney disease, stage 5: Secondary | ICD-10-CM

## 2015-12-05 DIAGNOSIS — N186 End stage renal disease: Secondary | ICD-10-CM

## 2015-12-05 HISTORY — PX: AV FISTULA PLACEMENT: SHX1204

## 2015-12-05 LAB — RENAL FUNCTION PANEL
Albumin: 3.1 g/dL — ABNORMAL LOW (ref 3.5–5.0)
Anion gap: 9 (ref 5–15)
BUN: 31 mg/dL — ABNORMAL HIGH (ref 6–20)
CO2: 25 mmol/L (ref 22–32)
Calcium: 9.1 mg/dL (ref 8.9–10.3)
Chloride: 103 mmol/L (ref 101–111)
Creatinine, Ser: 5.6 mg/dL — ABNORMAL HIGH (ref 0.61–1.24)
GFR calc Af Amer: 12 mL/min — ABNORMAL LOW (ref >60)
GFR calc non Af Amer: 11 mL/min — ABNORMAL LOW (ref >60)
Glucose, Bld: 166 mg/dL — ABNORMAL HIGH (ref 65–99)
Phosphorus: 4.5 mg/dL (ref 2.5–4.6)
Potassium: 3.3 mmol/L — ABNORMAL LOW (ref 3.5–5.1)
Sodium: 137 mmol/L (ref 135–145)

## 2015-12-05 LAB — GLUCOSE, CAPILLARY
Glucose-Capillary: 113 mg/dL — ABNORMAL HIGH (ref 65–99)
Glucose-Capillary: 136 mg/dL — ABNORMAL HIGH (ref 65–99)
Glucose-Capillary: 129 mg/dL — ABNORMAL HIGH (ref 65–99)
Glucose-Capillary: 124 mg/dL — ABNORMAL HIGH (ref 65–99)

## 2015-12-05 LAB — BASIC METABOLIC PANEL
Anion gap: 11 (ref 5–15)
BUN: 30 mg/dL — ABNORMAL HIGH (ref 6–20)
CO2: 24 mmol/L (ref 22–32)
Calcium: 9.1 mg/dL (ref 8.9–10.3)
Chloride: 99 mmol/L — ABNORMAL LOW (ref 101–111)
Creatinine, Ser: 5.69 mg/dL — ABNORMAL HIGH (ref 0.61–1.24)
GFR calc Af Amer: 12 mL/min — ABNORMAL LOW (ref >60)
GFR calc non Af Amer: 10 mL/min — ABNORMAL LOW (ref >60)
Glucose, Bld: 149 mg/dL — ABNORMAL HIGH (ref 65–99)
Potassium: 3.5 mmol/L (ref 3.5–5.1)
Sodium: 134 mmol/L — ABNORMAL LOW (ref 135–145)

## 2015-12-05 LAB — CBC
HCT: 28.1 % — ABNORMAL LOW (ref 39.0–52.0)
HCT: 28.6 % — ABNORMAL LOW (ref 39.0–52.0)
Hemoglobin: 9.8 g/dL — ABNORMAL LOW (ref 13.0–17.0)
Hemoglobin: 10.4 g/dL — ABNORMAL LOW (ref 13.0–17.0)
MCH: 27.4 pg (ref 26.0–34.0)
MCH: 28.8 pg (ref 26.0–34.0)
MCHC: 34.9 g/dL (ref 30.0–36.0)
MCHC: 36.4 g/dL — ABNORMAL HIGH (ref 30.0–36.0)
MCV: 78.5 fL (ref 78.0–100.0)
MCV: 79.2 fL (ref 78.0–100.0)
Platelets: 429 10*3/uL — ABNORMAL HIGH (ref 150–400)
Platelets: 451 10*3/uL — ABNORMAL HIGH (ref 150–400)
RBC: 3.58 MIL/uL — ABNORMAL LOW (ref 4.22–5.81)
RBC: 3.61 MIL/uL — ABNORMAL LOW (ref 4.22–5.81)
RDW: 17.1 % — ABNORMAL HIGH (ref 11.5–15.5)
RDW: 17.2 % — ABNORMAL HIGH (ref 11.5–15.5)
WBC: 14.3 10*3/uL — ABNORMAL HIGH (ref 4.0–10.5)
WBC: 14.3 10*3/uL — ABNORMAL HIGH (ref 4.0–10.5)

## 2015-12-05 LAB — PROTIME-INR
INR: 1.4 (ref 0.00–1.49)
Prothrombin Time: 17.3 seconds — ABNORMAL HIGH (ref 11.6–15.2)

## 2015-12-05 LAB — HEPARIN LEVEL (UNFRACTIONATED): Heparin Unfractionated: <0.1 IU/mL — ABNORMAL LOW (ref 0.30–0.70)

## 2015-12-05 LAB — SURGICAL PCR SCREEN
MRSA, PCR: NEGATIVE
Staphylococcus aureus: NEGATIVE

## 2015-12-05 SURGERY — INSERTION OF ARTERIOVENOUS (AV) GORE-TEX GRAFT ARM
Anesthesia: General | Laterality: Right

## 2015-12-05 MED ORDER — SODIUM CHLORIDE 0.9 % IV SOLN
INTRAVENOUS | Status: DC | PRN
  Administered 2015-12-05: 09:00:00

## 2015-12-05 MED ORDER — PROPOFOL 10 MG/ML IV BOLUS
INTRAVENOUS | Status: AC
Start: 2015-12-05 — End: 2015-12-05
  Filled 2015-12-05: qty 40

## 2015-12-05 MED ORDER — EPHEDRINE SULFATE 50 MG/ML IJ SOLN
INTRAMUSCULAR | Status: DC | PRN
  Administered 2015-12-05 (×2): 10 mg via INTRAVENOUS

## 2015-12-05 MED ORDER — HEPARIN SODIUM (PORCINE) 1000 UNIT/ML IJ SOLN
INTRAMUSCULAR | Status: AC
Start: 2015-12-05 — End: 2015-12-05
  Filled 2015-12-05: qty 1

## 2015-12-05 MED ORDER — PROPOFOL 10 MG/ML IV BOLUS
INTRAVENOUS | Status: DC | PRN
  Administered 2015-12-05: 50 mg via INTRAVENOUS
  Administered 2015-12-05: 150 mg via INTRAVENOUS
  Administered 2015-12-05: 30 mg via INTRAVENOUS
  Administered 2015-12-05 (×2): 50 mg via INTRAVENOUS

## 2015-12-05 MED ORDER — LIDOCAINE HCL (PF) 1 % IJ SOLN
INTRAMUSCULAR | Status: AC
  Filled 2015-12-05: qty 30

## 2015-12-05 MED ORDER — LIDOCAINE HCL (CARDIAC) 20 MG/ML IV SOLN
INTRAVENOUS | Status: DC | PRN
  Administered 2015-12-05: 60 mg via INTRAVENOUS

## 2015-12-05 MED ORDER — OXYCODONE HCL 5 MG/5ML PO SOLN: 5.0000 mg | Freq: Once | ORAL | Status: DC | PRN

## 2015-12-05 MED ORDER — ONDANSETRON HCL 4 MG/2ML IJ SOLN
INTRAMUSCULAR | Status: DC | PRN
  Administered 2015-12-05: 4 mg via INTRAVENOUS

## 2015-12-05 MED ORDER — HEPARIN (PORCINE) IN NACL 100-0.45 UNIT/ML-% IJ SOLN: 1600.0000 [IU]/h | INTRAMUSCULAR | Status: DC

## 2015-12-05 MED ORDER — FENTANYL CITRATE (PF) 100 MCG/2ML IJ SOLN
INTRAMUSCULAR | Status: DC | PRN
  Administered 2015-12-05 (×2): 50 ug via INTRAVENOUS
  Administered 2015-12-05 (×2): 25 ug via INTRAVENOUS

## 2015-12-05 MED ORDER — FENTANYL CITRATE (PF) 250 MCG/5ML IJ SOLN
INTRAMUSCULAR | Status: AC
  Filled 2015-12-05: qty 5

## 2015-12-05 MED ORDER — MIDAZOLAM HCL 5 MG/5ML IJ SOLN
INTRAMUSCULAR | Status: DC | PRN
  Administered 2015-12-05: 2 mg via INTRAVENOUS

## 2015-12-05 MED ORDER — MORPHINE SULFATE (PF) 2 MG/ML IV SOLN
1.0000 mg | INTRAVENOUS | Status: DC | PRN
  Administered 2015-12-05 – 2015-12-10 (×3): 2 mg via INTRAVENOUS
  Filled 2015-12-05 (×3): qty 1

## 2015-12-05 MED ORDER — FENTANYL CITRATE (PF) 100 MCG/2ML IJ SOLN: 25.0000 ug | INTRAMUSCULAR | Status: DC | PRN

## 2015-12-05 MED ORDER — OXYCODONE HCL 5 MG PO TABS: 5.0000 mg | ORAL_TABLET | Freq: Once | ORAL | Status: DC | PRN

## 2015-12-05 MED ORDER — MIDODRINE HCL 5 MG PO TABS
ORAL_TABLET | ORAL | Status: AC
  Filled 2015-12-05: qty 1

## 2015-12-05 MED ORDER — ONDANSETRON HCL 4 MG/2ML IJ SOLN: 4.0000 mg | Freq: Four times a day (QID) | INTRAMUSCULAR | Status: DC | PRN

## 2015-12-05 MED ORDER — HEPARIN SODIUM (PORCINE) 1000 UNIT/ML IJ SOLN
INTRAMUSCULAR | Status: DC | PRN
  Administered 2015-12-05: 5000 [IU] via INTRAVENOUS

## 2015-12-05 MED ORDER — WARFARIN - PHARMACIST DOSING INPATIENT
Freq: Every day | Status: DC
Start: 2015-12-05 — End: 2015-12-10

## 2015-12-05 MED ORDER — MIDAZOLAM HCL 2 MG/2ML IJ SOLN
INTRAMUSCULAR | Status: AC
  Filled 2015-12-05: qty 2

## 2015-12-05 MED ORDER — 0.9 % SODIUM CHLORIDE (POUR BTL) OPTIME
TOPICAL | Status: DC | PRN
  Administered 2015-12-05: 1000 mL

## 2015-12-05 MED ORDER — TRAMADOL HCL 50 MG PO TABS
50.0000 mg | ORAL_TABLET | Freq: Four times a day (QID) | ORAL | Status: DC | PRN
  Administered 2015-12-05 – 2015-12-10 (×3): 50 mg via ORAL
  Filled 2015-12-05 (×3): qty 1

## 2015-12-05 MED ORDER — HEPARIN (PORCINE) IN NACL 100-0.45 UNIT/ML-% IJ SOLN
1400.0000 [IU]/h | INTRAMUSCULAR | Status: DC
  Administered 2015-12-05: 1600 [IU]/h via INTRAVENOUS
  Administered 2015-12-07 – 2015-12-10 (×2): 1400 [IU]/h via INTRAVENOUS
  Filled 2015-12-05 (×7): qty 250

## 2015-12-05 MED ORDER — WARFARIN SODIUM 10 MG PO TABS: 10.0000 mg | ORAL_TABLET | Freq: Once | ORAL | Status: DC

## 2015-12-05 SURGICAL SUPPLY — 26 items
ARMBAND PINK RESTRICT EXTREMIT (MISCELLANEOUS) ×2 IMPLANT
CANISTER SUCTION 2500CC (MISCELLANEOUS) ×2 IMPLANT
CANNULA VESSEL 3MM 2 BLNT TIP (CANNULA) ×2 IMPLANT
CLIP TI MEDIUM 6 (CLIP) ×2 IMPLANT
CLIP TI WIDE RED SMALL 6 (CLIP) ×2 IMPLANT
DECANTER SPIKE VIAL GLASS SM (MISCELLANEOUS) ×2 IMPLANT
DRAIN PENROSE 1/4X12 LTX STRL (WOUND CARE) ×2 IMPLANT
ELECT REM PT RETURN 9FT ADLT (ELECTROSURGICAL) ×2
ELECTRODE REM PT RTRN 9FT ADLT (ELECTROSURGICAL) ×1 IMPLANT
GLOVE BIO SURGEON STRL SZ7.5 (GLOVE) ×2 IMPLANT
GOWN STRL REUS W/ TWL LRG LVL3 (GOWN DISPOSABLE) ×3 IMPLANT
GOWN STRL REUS W/TWL LRG LVL3 (GOWN DISPOSABLE) ×3
KIT BASIN OR (CUSTOM PROCEDURE TRAY) ×2 IMPLANT
KIT ROOM TURNOVER OR (KITS) ×2 IMPLANT
LIQUID BAND (GAUZE/BANDAGES/DRESSINGS) ×2 IMPLANT
LOOP VESSEL MINI RED (MISCELLANEOUS) ×2 IMPLANT
NS IRRIG 1000ML POUR BTL (IV SOLUTION) ×2 IMPLANT
PACK CV ACCESS (CUSTOM PROCEDURE TRAY) ×2 IMPLANT
PAD ARMBOARD 7.5X6 YLW CONV (MISCELLANEOUS) ×4 IMPLANT
SPONGE SURGIFOAM ABS GEL 100 (HEMOSTASIS) IMPLANT
SUT PROLENE 6 0 CC (SUTURE) ×4 IMPLANT
SUT VIC AB 3-0 SH 27 (SUTURE) ×2
SUT VIC AB 3-0 SH 27X BRD (SUTURE) ×2 IMPLANT
SUT VICRYL 4-0 PS2 18IN ABS (SUTURE) ×4 IMPLANT
UNDERPAD 30X30 INCONTINENT (UNDERPADS AND DIAPERS) ×2 IMPLANT
WATER STERILE IRR 1000ML POUR (IV SOLUTION) ×2 IMPLANT

## 2015-12-05 NOTE — Progress Notes (Signed)
Patient ID: Isaiah [name], male   DOB: 1966-08-19, 50 y.o.   MRN: [id number]  [facility] Progress Note    Subjective:   Patient was seen on dialysis and the procedure was supervised. BFR 400 Via RIJ TDC BP is 102/71.  Patient appears to be tolerating treatment well     Objective:   BP 102/71 mmHg  Pulse 82  Temp(Src) 97 F (36.1 C) (Oral)  Resp 18  Ht 5' 10"  (1.778 m)  Wt 91.3 kg (201 lb 4.5 oz)  BMI 28.88 kg/m2  SpO2 100%  Intake/Output: I/O last 3 completed shifts: In: 360 [P.O.:360] Out: 2000 [Other:2000]   Intake/Output this shift:  Total I/O In: 510 [I.V.:510] Out: 25 [Blood:25] Weight change: -5.1 kg (-11 lb 3.9 oz)  Physical Exam: Gen:WD WN AAM in NAD CVS:no rub Resp:cta IZT:IWPYKD Ext:no edema, R AVF +T/B  Labs: BMET  Recent Labs Lab 12/03/15 1207 12/05/15 0300 12/05/15 1234  NA 135 134* 137  K 2.7* 3.5 3.3*  CL 99* 99* 103  CO2 23 24 25   GLUCOSE 221* 149* 166*  BUN 52* 30* 31*  CREATININE 7.03* 5.69* 5.60*  ALBUMIN 3.3*  --  3.1*  CALCIUM 8.7* 9.1 9.1  PHOS  --   --  4.5   CBC  Recent Labs Lab 12/03/15 1207 12/05/15 0300 12/05/15 1234  WBC 14.1* 14.3* 14.3*  HGB 10.6* 9.8* 10.4*  HCT 29.2* 28.1* 28.6*  MCV 78.7 78.5 79.2  PLT 437* 429* 451*    @IMGRELPRIORS @ Medications:    . aspirin EC  81 mg Oral Daily  . calcitRIOL  0.5 mcg Oral Q M,W,F-HD  . fluticasone furoate-vilanterol  1 puff Inhalation Daily  . heparin  1,000 Units Dialysis Once in dialysis  . insulin aspart  0-15 Units Subcutaneous TID WC  . insulin glargine  25 Units Subcutaneous Q2200  . midodrine  2.5 mg Oral BID WC  . multivitamin  1 tablet Oral QHS  . pantoprazole  40 mg Oral BID  . warfarin  10 mg Oral ONCE-1800  . Warfarin - Pharmacist Dosing Inpatient   Does not apply [id number]     Assessment/ Plan:   1. PE/DVT- started on heparin bridge to prepare for surgery tomorrow, INR 1.65. 2. Vascular access- s/p R Cimino AVF placement  12/05/15 3. ESRD: continue with HD qMWF while he remains an inpatient 4. Anemia:on ESA 5. CKD-MBD:cont with binders 6. Nutrition: stable 7. Hypertension:stable  Donetta Potts, MD [place] Pager [phone] 12/05/2015, 2:50 PM

## 2015-12-05 NOTE — Progress Notes (Signed)
SLP Cancellation Note  Patient Details Name: Arless Vineyard [facility] MRN: [id number] DOB: [birth year]   Cancelled Treatment:     Pt in a procedure  Cherokee Clowers, Katherene Ponto 12/05/2015, 7:30 AM

## 2015-12-05 NOTE — Progress Notes (Signed)
OT Cancellation Note  Patient Details Name: Isaiah [name] MRN: [id number] DOB: September 28, 1965   Cancelled Treatment:    Reason Eval/Treat Not Completed: Patient at procedure or test/ unavailable (Pt in surgery. Will follow.)  [name] 12/05/2015, 8:25 AM

## 2015-12-05 NOTE — Transfer of Care (Signed)
Immediate Anesthesia Transfer of Care Note  Patient: Isaiah [name]   Procedure(s) Performed: Procedure(s): INSERTION OF ARTERIOVENOUS (AV) GORE-TEX GRAFT ARM (Right)  Patient Location: PACU  Anesthesia Type:General  Level of Consciousness: awake, alert , oriented and sedated  Airway & Oxygen Therapy: Patient Spontanous Breathing and Patient connected to nasal cannula oxygen  Post-op Assessment: Report given to RN, Post -op Vital signs reviewed and stable and Patient moving all extremities  Post vital signs: Reviewed and stable  Last Vitals:  Filed Vitals:   12/05/15 1016 12/05/15 1030  BP: 123/83 122/78  Pulse: 93 87  Temp:    Resp: 15 15    Complications: No apparent anesthesia complications

## 2015-12-05 NOTE — Anesthesia Procedure Notes (Signed)
Procedure Name: LMA Insertion Date/Time: 12/05/2015 8:44 AM Performed by: Scheryl Darter Pre-anesthesia Checklist: Patient identified, Emergency Drugs available, Suction available and Patient being monitored Patient Re-evaluated:Patient Re-evaluated prior to inductionOxygen Delivery Method: Circle System Utilized Preoxygenation: Pre-oxygenation with 100% oxygen Intubation Type: IV induction Ventilation: Mask ventilation without difficulty LMA: LMA inserted LMA Size: 5.0 Number of attempts: 1 Placement Confirmation: positive ETCO2 and breath sounds checked- equal and bilateral Tube secured with: Tape Dental Injury: Teeth and Oropharynx as per pre-operative assessment

## 2015-12-05 NOTE — Anesthesia Preprocedure Evaluation (Signed)
Anesthesia Evaluation  Patient identified by MRN, date of birth, ID band Patient awake    Reviewed: Allergy & Precautions, NPO status , Patient's Chart, lab work & pertinent test results  Airway Mallampati: II   Neck ROM: full    Dental   Pulmonary shortness of breath, sleep apnea , PE   breath sounds clear to auscultation       Cardiovascular hypertension, + Peripheral Vascular Disease   Rhythm:regular Rate:Normal     Neuro/Psych    GI/Hepatic   Endo/Other  diabetes, Type 2  Renal/GU ESRF and DialysisRenal disease     Musculoskeletal   Abdominal   Peds  Hematology  (+) Blood dyscrasia, Sickle cell trait ,   Anesthesia Other Findings   Reproductive/Obstetrics                             Anesthesia Physical Anesthesia Plan  ASA: IV  Anesthesia Plan: General   Post-op Pain Management:    Induction: Intravenous  Airway Management Planned: LMA  Additional Equipment:   Intra-op Plan:   Post-operative Plan:   Informed Consent: I have reviewed the patients History and Physical, chart, labs and discussed the procedure including the risks, benefits and alternatives for the proposed anesthesia with the patient or authorized representative who has indicated his/her understanding and acceptance.     Plan Discussed with: CRNA, Anesthesiologist and Surgeon  Anesthesia Plan Comments:         Anesthesia Quick Evaluation

## 2015-12-05 NOTE — Op Note (Signed)
Procedure: Right Radial Cephalic AV fistula   Preop: ESRD   Postop: ESRD   Anesthesia: General  Assistant:  Silva Bandy, PA-c   Findings: 2.5 mm cephalic vein       2 mm radial artery   Procedure Details:  After induction of general anesthesia, the right upper extremity was prepped and draped in usual sterile fashion.   A longitudinal skin incision was then made midway between the cephalic vein and radial artery at the wrist.   The incision was carried into the subcutaneous tissues down to level cephalic vein. The vein had some spasm but was overall reasonable quality but small at 2.5 mm. The vein was dissected free circumferentially and small side branches ligated and divided between silk ties. The distal end was ligated and the vein was gently distended with heparinized saline, spatulated, and marked for orientation. Next the radial artery was dissected free in the medial portion incision. The artery was 2 mm in diameter but had a reasonable pulse. The vessel loops were placed proximal and distal to the planned site of arteriotomy. The patient was given 5000 units of intravenous heparin. After appropriate circulation time, small bulldog clamps were used to control the artery. A longitudinal opening was made in the left radial artery. The vein was controlled proximally with a fine bulldog clamp. The vein was then swung over to the artery and sewn end of vein to side of artery using a running 6-0 Prolene suture. Just prior to completion, the anastomosis was fore bled back bled and thoroughly flushed. The anastomosis was secured, vessel loops released, and there was good doppler flow in the fistula immediately. After hemostasis was obtained, the subcutaneous tissues were reapproximated using a running 3-0 Vicryl suture. The skin was then closed with a 4 0 Vicryl subcuticular stitch. Dermabond was applied to the skin incision. The patient tolerated the procedure well and there were no complications.  Instrument sponge and needle count were correct at the end of the case. The patient was taken to PACU in stable condition.   Ruta Hinds, MD  [facility] of [place]  Office: [phone]  Pager: [phone]

## 2015-12-05 NOTE — Telephone Encounter (Addendum)
Spoke to pt's wife to sch appt. 01/17/16 lab at 2, md at 2:45.  ----- Message from Mena Goes, RN sent at 12/05/2015 10:25 AM EDT ----- Regarding: schedule   ----- Message -----    From: Alvia [place], PA-C    Sent: 12/05/2015  10:09 AM      To: Vvs Charge Pool  S/p right radial cephalic AVF 2/95/74  F/u with Dr. Oneida Alar in 4 weeks with duplex  Thanks Maudie Mercury

## 2015-12-05 NOTE — Progress Notes (Signed)
PT Cancellation Note  Patient Details Name: [place] MRN: [id number] DOB: [birth year]   Cancelled Treatment:    Reason Eval/Treat Not Completed: Patient at procedure or test/unavailable Pt off floor in OR. Will follow up as appropriate.   Marguarite Arbour A Yaser Harvill 12/05/2015, 8:59 AM Wray Kearns, [place], DPT [phone]

## 2015-12-05 NOTE — Anesthesia Postprocedure Evaluation (Signed)
Anesthesia Post Note  Patient: Isaiah [name]  Procedure(s) Performed: Procedure(s) (LRB): INSERTION OF ARTERIOVENOUS (AV) GORE-TEX GRAFT ARM (Right)  Patient location during evaluation: PACU Anesthesia Type: General Level of consciousness: awake and alert and patient cooperative Pain management: pain level controlled Vital Signs Assessment: post-procedure vital signs reviewed and stable Respiratory status: spontaneous breathing and respiratory function stable Cardiovascular status: stable Anesthetic complications: no    Last Vitals:  Filed Vitals:   12/05/15 1530 12/05/15 1609  BP: 86/64 88/68  Pulse: 86 106  Temp:    Resp:      Last Pain:  Filed Vitals:   12/05/15 1610  PainSc: 0-No pain                 Isaiah [name] S

## 2015-12-05 NOTE — Progress Notes (Signed)
ANTICOAGULATION CONSULT NOTE - Follow Up Consult  Pharmacy Consult for Heparin/Coumadin Indication: h/o PE  Allergies  Allergen Reactions  . Codeine Rash and Other (See Comments)    Unknown reaction (patient says it was more serious than just a rash, but he can't remember what happened)    Patient Measurements: Height: 5' 10"  (177.8 cm) Weight: 188 lb 4.4 oz (85.4 kg) IBW/kg (Calculated) : 73 Heparin Dosing Weight: 90 kg  Vital Signs: Temp: 97.8 F (36.6 C) (03/29 1108) Temp Source: Oral (03/29 0543) BP: 111/77 mmHg (03/29 1128) Pulse Rate: 85 (03/29 1128)  Labs:  Recent Labs  12/03/15 1207 12/04/15 0434 12/04/15 1431 12/05/15 0300  HGB 10.6*  --   --  9.8*  HCT 29.2*  --   --  28.1*  PLT 437*  --   --  429*  LABPROT 24.6* 19.5*  --  17.3*  INR 2.25* 1.65*  --  1.40  HEPARINUNFRC  --   --  0.57 <0.10*  CREATININE 7.03*  --   --  5.69*    Estimated Creatinine Clearance: 15.7 mL/min (by C-G formula based on Cr of 5.69).  Assessment: 69 YOM with h/o of PE on Coumadin PTA to switch to IV heparin for insertion of IV graft on 3/29. Per patient, his last dose of Coumadin was on 3/25 with admit INR 2.25   Anticoagulation: Coumadin PTA. Held for IV graft insertion. To start heparin when INR<2. INR 1.4. Resume heparin and Coumadin 3/29 s/p AVF. Hgb 9.8 down today. - Coumadin PTA 7.33m daily  Goal of Therapy:  Heparin level 0.3-0.7 units/ml Monitor platelets by anticoagulation protocol: Yes   Plan:  Resume IV heparin 6 hrs post-procedure at previous 1600 units/hr. Check level 6 hrs later. Coumadin 174mpo x 1 tonight. Daily HL, CBC, INR.   Kayleanna Lorman S. RoAlford HighlandPharmD, BCPS Clinical Staff Pharmacist Pager [phone] Ghazitillinger 12/05/2015,12:41 PM

## 2015-12-05 NOTE — H&P (View-Only) (Signed)
Vascular and Vein Specialist of [facility]  Patient name: Isaiah [name] [facility] MRN: [id number] DOB: 1966-01-21 Sex: male  REASON FOR VISIT:   Permanent hemodialysis access HPI: Isaiah [name] is a 50 y.o. male who is seen in follow-up today for consideration of placement of a long-term hemodialysis access. The patient was initially seen as a consult in the hospital at the request of Dr. Florene Glen. The patient had had a stroke at that point from a paradoxical embolus from DVT. He is currently on Coumadin. He is dialyzing currently by catheter. Stroke affected his right arm and he has had some return of function in this arm. He is also having improvement of his speech. His current dialysis today is Monday Wednesday Friday.   Past Medical History  Diagnosis Date  . Diabetes mellitus without complication ([place])   . Sickle cell-thalassemia disease ([place])     a. Sickle cell trait.  . Pulmonary embolism ([place]) 07/2011    a. Tx with Coumadin for 6 months (unknown cause per patient).  . Hyperkalemia 07/2011  . Acute renal failure ([place]) 07/2011  . Monoclonal gammopathy   . Sleep apnea     a. uses CPAP.  [name] Hypertension   . Gout   . Pulmonary embolism ([place]) 07/2011; 09/27/2014    a. Bilat PE 07/2011 - unclear cause, tx with 6 months Coumadin.;   . Monoclonal gammopathies   . Anemia   . Asthma   . OSA on CPAP   . History of recent blood transfusion 10/27/14    2 Units PRBC's  . ESRD (end stage renal disease) on dialysis [name])     Family History  Problem Relation Age of Onset  . Hypertension Mother   . Diabetes Mother   . Sickle cell anemia Brother   . Diabetes type I Brother   . Kidney disease Brother     SOCIAL HISTORY: Social History  Substance Use Topics  . Smoking status: Never Smoker   . Smokeless tobacco: Never Used  . Alcohol Use: No    Allergies  Allergen Reactions  . Codeine Other (See Comments)    Pt unknown  . Codeine Rash and Other (See Comments)    Unknown reaction  (patient says it was more serious than just a rash, but he can't remember what happened)    Current Outpatient Prescriptions  Medication Sig Dispense Refill  . aspirin EC 81 MG tablet Take 1 tablet (81 mg total) by mouth daily. 100 tablet 0  . cetirizine (ZYRTEC) 10 MG tablet Take 10 mg by mouth daily.     [name] epoetin alfa (EPOGEN,PROCRIT) 29924 UNIT/ML injection Inject 5,000 Units into the skin once a week.     . fluticasone furoate-vilanterol (BREO ELLIPTA) 100-25 MCG/INH AEPB Inhale 1 puff into the lungs daily. 60 each 1  . Insulin Glargine (LANTUS) 100 UNIT/ML Solostar Pen Inject 25 Units into the skin daily at 10 pm. 15 mL 1  . midodrine (PROAMATINE) 2.5 MG tablet Take 1 tablet (2.5 mg total) by mouth 2 (two) times daily with a meal. 60 tablet 1  . multivitamin (RENA-VIT) TABS tablet Take 1 tablet by mouth at bedtime. 30 tablet 1  . NEEDLE, DISP, 27 G (BD DISP NEEDLES) 27G X 1/2" MISC 1 application by Does not apply route daily after supper. 100 each 0  . pantoprazole (PROTONIX) 40 MG tablet Take 1 tablet (40 mg total) by mouth 2 (two) times daily. 60 tablet 1  . warfarin (COUMADIN) 5 MG tablet  Take 1.5 tablets (7.5 mg total) by mouth daily at 6 PM. 45 tablet 1   No current facility-administered medications for this visit.    REVIEW OF SYSTEMS:  [X]  denotes positive finding, [ ]  denotes negative finding Cardiac  Comments:  Chest pain or chest pressure:    Shortness of breath upon exertion:    Short of breath when lying flat:    Irregular heart rhythm:        Vascular    Pain in calf, thigh, or hip brought on by ambulation:    Pain in feet at night that wakes you up from your sleep:     Blood clot in your veins:    Leg swelling:         Pulmonary    Oxygen at home:    Productive cough:     Wheezing:         Neurologic    Sudden weakness in arms or legs:     Sudden numbness in arms or legs:     Sudden onset of difficulty speaking or slurred speech:    Temporary loss of  vision in one eye:     Problems with dizziness:         Gastrointestinal    Blood in stool:     Vomited blood:         Genitourinary    Burning when urinating:     Blood in urine:        Psychiatric    Major depression:         Hematologic    Bleeding problems:    Problems with blood clotting too easily:        Skin    Rashes or ulcers:        Constitutional    Fever or chills:      PHYSICAL EXAM: Filed Vitals:   11/15/15 1048  BP: 112/78  Pulse: 84  Temp: 97.9 F (36.6 C)  Resp: 16  Height: 5' 10"  (1.778 m)  Weight: 197 lb (89.359 kg)  SpO2: 98%    GENERAL: The patient is a well-nourished male, in no acute distress. The vital signs are documented above. CARDIAC: There is a regular rate and rhythm.  VASCULAR:  One plus right brachial and radial pulse PULMONARY: There is good air exchange bilaterally without wheezing or rales. ABDOMEN: Soft and non-tender with normal pitched bowel sounds.  MUSCULOSKELETAL: There are no major deformities or cyanosis. NEUROLOGIC:  3/5 motor strength right upper extremity some difficulty finding words for speech SKIN: There are no ulcers or rashes noted. PSYCHIATRIC: The patient has a normal affect.  DATA:   vein mapping ultrasound dated 11/08/2015 is reviewed today. Cephalic and basilic veins are quite small less than 2 mm in diameter throughout most of the course normal arterial configuration of the brachial and radial arteries with a 4 mm right brachial artery triphasic waveforms  MEDICAL ISSUES:  Patient needs long-term hemodialysis access. We will schedule him for placement of a right upper arm AV graft on March 29. He will stop his Coumadin on March 26. We will admit him for IV heparin on March 27 for bridging since he is high risk due to recent DVT and stroke. He will need to remain in the hospital postoperatively until he is fully quite dilated. Risks benefits possible palpitations and procedure details including but limited to  bleeding infection graft thrombosis 25% chance 1 year were explained the patient today. He understands and agrees to  proceed.   We will consult nephrology service during his hospital admission for dialysis needs during his hospitalization.  Ruta Hinds Vascular and [employer]: [phone]

## 2015-12-05 NOTE — Interval H&P Note (Signed)
History and Physical Interval Note:  12/05/2015 8:27 AM  [place]  has presented today for surgery, with the diagnosis of End Stage Renal Disease N18.6  The various methods of treatment have been discussed with the patient and family. After consideration of risks, benefits and other options for treatment, the patient has consented to  Procedure(s): INSERTION OF ARTERIOVENOUS (AV) GORE-TEX GRAFT ARM (Right) as a surgical intervention .  The patient's history has been reviewed, patient examined, no change in status, stable for surgery.  I have reviewed the patient's chart and labs.  Questions were answered to the patient's satisfaction.     Ruta Hinds

## 2015-12-06 ENCOUNTER — Telehealth: Payer: Self-pay | Admitting: Nurse Practitioner

## 2015-12-06 ENCOUNTER — Ambulatory Visit: Payer: Self-pay | Admitting: Oncology

## 2015-12-06 ENCOUNTER — Other Ambulatory Visit: Payer: Self-pay | Admitting: *Deleted

## 2015-12-06 LAB — CBC
HCT: 30 % — ABNORMAL LOW (ref 39.0–52.0)
Hemoglobin: 10.6 g/dL — ABNORMAL LOW (ref 13.0–17.0)
MCH: 27.8 pg (ref 26.0–34.0)
MCHC: 35.3 g/dL (ref 30.0–36.0)
MCV: 78.7 fL (ref 78.0–100.0)
Platelets: 426 10*3/uL — ABNORMAL HIGH (ref 150–400)
RBC: 3.81 MIL/uL — ABNORMAL LOW (ref 4.22–5.81)
RDW: 16.9 % — ABNORMAL HIGH (ref 11.5–15.5)
WBC: 15.9 10*3/uL — ABNORMAL HIGH (ref 4.0–10.5)

## 2015-12-06 LAB — GLUCOSE, CAPILLARY
Glucose-Capillary: 89 mg/dL (ref 65–99)
Glucose-Capillary: 189 mg/dL — ABNORMAL HIGH (ref 65–99)
Glucose-Capillary: 223 mg/dL — ABNORMAL HIGH (ref 65–99)
Glucose-Capillary: 128 mg/dL — ABNORMAL HIGH (ref 65–99)

## 2015-12-06 LAB — PROTIME-INR
INR: 1.36 (ref 0.00–1.49)
Prothrombin Time: 16.9 seconds — ABNORMAL HIGH (ref 11.6–15.2)

## 2015-12-06 LAB — HEPARIN LEVEL (UNFRACTIONATED)
Heparin Unfractionated: 1.05 IU/mL — ABNORMAL HIGH (ref 0.30–0.70)
Heparin Unfractionated: 0.52 IU/mL (ref 0.30–0.70)

## 2015-12-06 MED ORDER — TRAMADOL HCL 50 MG PO TABS: 50.0000 mg | ORAL_TABLET | Freq: Four times a day (QID) | ORAL | Status: DC | PRN

## 2015-12-06 MED ORDER — WARFARIN SODIUM 10 MG PO TABS
10.0000 mg | ORAL_TABLET | Freq: Once | ORAL | Status: AC
  Administered 2015-12-06: 10 mg via ORAL
  Filled 2015-12-06: qty 1

## 2015-12-06 NOTE — Progress Notes (Signed)
Patient ID: [place], male   DOB: Mar 24, 1966, 50 y.o.   MRN: [id number]  [facility] Progress Note    Subjective:   Feels well, no complaints   Objective:   BP 108/73 mmHg  Pulse 101  Temp(Src) 97.6 F (36.4 C) (Oral)  Resp 16  Ht 5' 10"  (1.778 m)  Wt 88.5 kg (195 lb 1.7 oz)  BMI 27.99 kg/m2  SpO2 100%  Intake/Output: I/O last 3 completed shifts: In: 510 [I.V.:510] Out: 3020 [Other:2995; Blood:25]   Intake/Output this shift:    Weight change: 5.9 kg (13 lb 0.1 oz)  Physical Exam: Gen:WD AAM in NAD CVS:no rub Resp:cta LOV:FIEPPI Ext:no edema, R AVF  Labs: BMET  Recent Labs Lab 12/03/15 1207 12/05/15 0300 12/05/15 1234  NA 135 134* 137  K 2.7* 3.5 3.3*  CL 99* 99* 103  CO2 23 24 25   GLUCOSE 221* 149* 166*  BUN 52* 30* 31*  CREATININE 7.03* 5.69* 5.60*  ALBUMIN 3.3*  --  3.1*  CALCIUM 8.7* 9.1 9.1  PHOS  --   --  4.5   CBC  Recent Labs Lab 12/03/15 1207 12/05/15 0300 12/05/15 1234 12/06/15 0320  WBC 14.1* 14.3* 14.3* 15.9*  HGB 10.6* 9.8* 10.4* 10.6*  HCT 29.2* 28.1* 28.6* 30.0*  MCV 78.7 78.5 79.2 78.7  PLT 437* 429* 451* 426*    @IMGRELPRIORS @ Medications:    . aspirin EC  81 mg Oral Daily  . calcitRIOL  0.5 mcg Oral Q M,W,F-HD  . fluticasone furoate-vilanterol  1 puff Inhalation Daily  . heparin  1,000 Units Dialysis Once in dialysis  . insulin aspart  0-15 Units Subcutaneous TID WC  . insulin glargine  25 Units Subcutaneous Q2200  . midodrine  2.5 mg Oral BID WC  . multivitamin  1 tablet Oral QHS  . pantoprazole  40 mg Oral BID  . warfarin  10 mg Oral ONCE-1800  . Warfarin - Pharmacist Dosing Inpatient   Does not apply q1800   Dialysis Orders: Center: [facility]  MonWedFri, 4 hrs 15 min, 180NRe Optiflux, BFR 400, DFR Manual 800 mL/min, EDW 88 (kg), Dialysate 2.0 K, 2.25 Ca, UFR Profile: None, Sodium Model: None, Access: RIJ perm catheter tunneled, right cimino AVF (placed 12/05/15) Mircera: 75 mcg IV Q 2 weeks (last  dose 11/21/15) Calcitriol: 0.25 mcg PO Q MWF   Assessment/ Plan:   1. PE/DVT- on heparin bridge and will start coumadin today 2. Vascular access- s/p R Cimino AVF placement 12/05/15 3. ESRD: continue with HD qMWF while he remains an inpatient 4. Anemia:on ESA 5. CKD-MBD:cont with binders 6. Nutrition: stable 7. Hypertension:stable  Donetta Potts, MD [place] Pager [phone] 12/06/2015, 7:44 AM

## 2015-12-06 NOTE — Discharge Instructions (Signed)

## 2015-12-06 NOTE — Evaluation (Signed)
Speech Language Pathology Evaluation Patient Details Name: Isaiah [name] [facility] MRN: [id number] DOB: 02-14-1966 Today's Date: 12/06/2015 Time: 6226-3335 SLP Time Calculation (min) (ACUTE ONLY): 33 min  Problem List:  Patient Active Problem List   Diagnosis Date Noted  . ESRD (end stage renal disease) ([place]) 12/03/2015  . PFO (patent foramen ovale) 11/06/2015  . CVA (cerebral infarction)   . ESRD (end stage renal disease) on dialysis ([place])   . S/P dialysis catheter insertion ([place])   . Acute deep vein thrombosis (DVT) of popliteal vein of left lower extremity ([place])   . Leukocytosis   . Labile blood glucose   . Acute blood loss anemia   . Hemiparesis, aphasia, and dysphagia as late effects of cerebrovascular accident ([place]) 10/13/2015  . Gait disturbance, post-stroke 10/13/2015  . Embolic stroke ([place]) 45/62/5638  . Plasma cell disorder   . Anemia 10/31/2014  . Long term current use of anticoagulant therapy 10/31/2014  . Renal insufficiency 10/31/2014  . Iron deficiency anemia due to chronic blood loss 10/30/2014  . Encounter for therapeutic drug monitoring 10/11/2014  . Pulmonary embolus ([place]) 09/27/2014  . ESRD on dialysis ([place]) 09/27/2014  . MGUS (monoclonal gammopathy of unknown significance) 09/27/2014  . OSA on CPAP 09/27/2014  . Diabetic retinopathy of both eyes ([place]) 02/03/2012  . Sickle cell trait ([place]) 07/19/2011  . Morbid obesity ([place]) 07/19/2011  . PE (pulmonary embolism) 07/18/2011  . SOB (shortness of breath) 07/18/2011  . Hyperkalemia 07/18/2011  . ARF (acute renal failure) ([place]) 07/18/2011  . Leukocytosis, unspecified 07/18/2011  . DM (diabetes mellitus) type II controlled with renal manifestation ([place]) 07/18/2011  . Cardiac enzymes elevated 07/18/2011  . Metabolic acidosis 93/73/4287  . HTN (hypertension) 07/18/2011   Past Medical History:  Past Medical History  Diagnosis Date  . Diabetes mellitus without complication ([place])   . Sickle cell-thalassemia disease  ([place])     a. Sickle cell trait.  . Pulmonary embolism ([place]) 07/2011    a. Tx with Coumadin for 6 months (unknown cause per patient).  . Hyperkalemia 07/2011  . Acute renal failure ([place]) 07/2011  . Monoclonal gammopathy   . Sleep apnea     a. uses CPAP.  [name] Hypertension   . Gout   . Pulmonary embolism ([place]) 07/2011; 09/27/2014    a. Bilat PE 07/2011 - unclear cause, tx with 6 months Coumadin.;   . Monoclonal gammopathies   . Anemia   . Asthma   . OSA on CPAP   . History of recent blood transfusion 10/27/14    2 Units PRBC's  . ESRD (end stage renal disease) on dialysis [facility])    Past Surgical History:  Past Surgical History  Procedure Laterality Date  . Cholecystectomy    . Bone marrow biopsy    . Other surgical history      Retinal surgery  . Pars plana vitrectomy  02/17/2012    Procedure: PARS PLANA VITRECTOMY WITH 25 GAUGE;  Surgeon: Hayden Pedro, MD;  Location: [place];  Service: Ophthalmology;  Laterality: Right;  . Cholecystectomy  1990's?  . Eye surgery Right   . Insertion of dialysis catheter Right 10/16/2015    Procedure: INSERTION OF PALINDROME DIALYSIS CATHETER ;  Surgeon: Elam Dutch, MD;  Location: [place];  Service: Vascular;  Laterality: Right;   HPI:  Isaiah [name] is a 50 y.o. male who is admitted for placement of a long-term hemodialysis access.  The patient had had a stroke at that point from a paradoxical  embolus from DVT. Stroke affected his right arm and he has had some return of function in this arm. He is also having improvement of his speech. He requested continuation of therapy while in the hospital.    Assessment / Plan / Recommendation Clinical Impression  Pt exhibits cognitive impairments, dysfluency and motor speech deficit marked by dysarthria. Conversational speech errors include hesitations, part word repetitions and phonemic distortions decreasing intelligibility. Decreased processing speed noted on subtests of MOCA and generative naming task.  Moderate difficutly performing basic calculations. Overall, working memory is functional for information pertinent to patient and suspect higher level attention would be challenging. Continue ST while pt on acute and recommend outpatient ST.        SLP Assessment  Patient needs continued Speech Lanaguage Pathology Services    Follow Up Recommendations  Outpatient SLP    Frequency and Duration min 2x/week  2 weeks      SLP Evaluation Prior Functioning  Cognitive/Linguistic Baseline: Baseline deficits Baseline deficit details:  (dysarthria, cognition) Type of Home: Apartment  Lives With: Alone Available Help at Discharge: Available 24 hours/day;Family Vocation:  Psychologist, prison and probation services)   Cognition  Overall Cognitive Status: History of cognitive impairments - at baseline (from CVA Jan 2017) Arousal/Alertness: Awake/alert Orientation Level: Oriented X4 Attention: Sustained Sustained Attention: Appears intact Memory: Impaired (5 word recall) Memory Impairment: Retrieval deficit Awareness: Appears intact Problem Solving: Impaired Problem Solving Impairment: Functional basic Executive Function: Self Correcting;Sequencing;Organizing Safety/Judgment: Appears intact    Comprehension  Auditory Comprehension Overall Auditory Comprehension: Impaired Yes/No Questions: Not tested Commands: Impaired Multistep Basic Commands: 50-74% accurate Interfering Components: Processing speed EffectiveTechniques: Extra processing time Visual Recognition/Discrimination Discrimination: Not tested Reading Comprehension Reading Status: Not tested    Expression Expression Primary Mode of Expression: Verbal Verbal Expression Overall Verbal Expression: Appears within functional limits for tasks assessed Initiation: No impairment Level of Generative/Spontaneous Verbalization: Conversation Repetition:  (NT) Naming: No impairment Pragmatics: No impairment Other Verbal Expression Comments:  (6 items  in category in one minute) Written Expression Dominant Hand: Right Dictation Ability: Word   Oral / Motor  Oral Motor/Sensory Function Overall Oral Motor/Sensory Function:  (mod-severe) Motor Speech Overall Motor Speech: Impaired Respiration: Within functional limits Phonation: Normal Resonance: Within functional limits Articulation: Impaired Level of Impairment: Sentence Intelligibility: Intelligibility reduced Word: 75-100% accurate Phrase: 75-100% accurate Sentence: 75-100% accurate Conversation: 75-100% accurate Motor Planning: Witnin functional limits Motor Speech Errors: Aware   GO                    Houston Siren 12/06/2015, 3:23 PM  Orbie Pyo Shaili Donalson M.Ed [employer] [phone]

## 2015-12-06 NOTE — Progress Notes (Signed)
Utilization review completed.  

## 2015-12-06 NOTE — Progress Notes (Signed)
PT Cancellation Note  Patient Details Name: Isaiah [name] MRN: [id number] DOB: 1966/06/15   Cancelled Treatment:    Reason Eval/Treat Not Completed: Medical issues which prohibited therapy Pt on bedrest. Will await increase in activity orders prior to initiation of PT evaluation.   Marguarite Arbour A Colten Desroches 12/06/2015, 7:52 AM Wray Kearns, PT, DPT [phone]

## 2015-12-06 NOTE — Evaluation (Signed)
Occupational Therapy Evaluation and Discharge Patient Details Name: Isaiah [name] [facility] MRN: [id number] DOB: 09-06-1966 Today's Date: 12/06/2015    History of Present Illness Patient is a 50 y/o male with hx of CVA, DM, HTN, sickle cell trait, PE and ESRD on HD presents for AVF Placement.   Clinical Impression   Pt demonstrated ability to perform self care at a modified independent level, only requiring assistance because of lines. Pt walking about room with support of IV pole. Pt able to use R UE as a gross functional assist, plans to begin OPOT. No acute OT needs, will defer further OT to OP.     Follow Up Recommendations  Outpatient OT    Equipment Recommendations  None recommended by OT    Recommendations for Other Services       Precautions / Restrictions Precautions Precautions: Fall Restrictions Weight Bearing Restrictions: No      Mobility Bed Mobility    General bed mobility comments: pt standing at sink upon OTs arrival, returned to chair  Transfers Overall transfer level: Modified independent              Balance Overall balance assessment: Needs assistance Sitting-balance support: Feet supported;No upper extremity supported Sitting balance-Leahy Scale: Good     Standing balance support: During functional activity Standing balance-Leahy Scale: Good                     ADL Overall ADL's : Modified independent                                     Functional mobility during ADLs: Modified independent (pushed IV pole throughout room and to bathroom) General ADL Comments: Pt stood at sink and completed grooming and upper body bathing and dressing with assist only to manage telemetry box and IV lines. Opened containers and managed washcloth and towels without assistance. Pt also simulated ability to manage LB bathing and dressing.  R hand functions as a gross assist. Pt reports inability to write with R hand, needed assist to tie gown  in back.     Vision     Perception     Praxis      Pertinent Vitals/Pain Pain Assessment: No/denies pain Faces Pain Scale: Hurts a little bit Pain Location: surgical site Pain Descriptors / Indicators: Sore Pain Intervention(s): Monitored during session     Hand Dominance Right   Extremity/Trunk Assessment Upper Extremity Assessment Upper Extremity Assessment: RUE deficits/detail RUE Deficits / Details: residual R UE weakness from recent CVA, especially distal RUE Coordination: decreased fine motor   Lower Extremity Assessment Lower Extremity Assessment: Defer to PT evaluation RLE Deficits / Details: Functional strength demonstrated. Grossly ~4/5 throughout       Communication Communication Communication: Expressive difficulties   Cognition Arousal/Alertness: Awake/alert Behavior During Therapy: WFL for tasks assessed/performed Overall Cognitive Status: Within Functional Limits for tasks assessed (some slow processing speed)                     General Comments       Exercises       Shoulder Instructions      Home Living Family/patient expects to be discharged to:: Private residence Living Arrangements: Spouse/significant other Available Help at Discharge: Available 24 hours/day;Family (mother is in town from [place]) Type of Home: Apartment Home Access: Stairs to enter [employer] of Steps: 17 Entrance Stairs-Rails: Right  Home Layout: One level     Bathroom Shower/Tub: Teacher, early years/pre: Standard Bathroom Accessibility: Yes   Home Equipment: Cane - single point;Shower seat   Additional Comments: 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 Just finished HH and about to start OP    OT Diagnosis: Hemiplegia dominant side   OT Problem List: Decreased coordination;Decreased  cognition   OT Treatment/Interventions:      OT Goals(Current goals can be found in the care plan section) Acute Rehab OT Goals Patient Stated Goal: to return to driving, independence, start OP therapy  OT Frequency:     Barriers to D/C:            Co-evaluation              End of Session    Activity Tolerance: Patient tolerated treatment well Patient left: in chair;with call bell/phone within reach   Time: 1150-1224 OT Time Calculation (min): 34 min Charges:  OT General Charges $OT Visit: 1 Procedure OT Evaluation $OT Eval Moderate Complexity: 1 Procedure OT Treatments $Self Care/Home Management : 8-22 mins G-Codes:    [name] 12/06/2015, 1:14 PM [phone]

## 2015-12-06 NOTE — Progress Notes (Signed)
ANTICOAGULATION CONSULT NOTE - Follow Up Consult  Pharmacy Consult for heparin Indication: h/o PE   Labs:  Recent Labs  12/03/15 1207 12/04/15 0434 12/04/15 1431 12/05/15 0300 12/05/15 1234 12/06/15 0047  HGB 10.6*  --   --  9.8* 10.4*  --   HCT 29.2*  --   --  28.1* 28.6*  --   PLT 437*  --   --  429* 451*  --   LABPROT 24.6* 19.5*  --  17.3*  --   --   INR 2.25* 1.65*  --  1.40  --   --   HEPARINUNFRC  --   --  0.57 <0.10*  --  1.05*  CREATININE 7.03*  --   --  5.69* 5.60*  --      Assessment: 50yo male supratherapeutic on heparin after resumed s/p IV graft insertion; of note pt has limited access and labs have to be drawn from same arm as IV, though RN reports that lab draw was >2 in below IV site.  Goal of Therapy:  Heparin level 0.3-0.7 units/ml   Plan:  Will decrease heparin gtt by 2-3 units/kg/hr to 1400 units/hr and check level in [place], PharmD, BCPS  12/06/2015,1:47 AM

## 2015-12-06 NOTE — Consult Note (Signed)
   [facility] Inpatient Consult   12/06/2015  [place] Jan 14, 1966 [id number]   Went to bedside to speak with Mr. [place]. He is active with the Link to Wellness program for DM management. He wants to continue to follow up with the Pharmacist with [facility] to Wellness program. Spoke with Norm Parcel to Wellness CMA Coordinator to make aware. She will follow up with patient's wife, per his request, for Link to Wellness appointment to be scheduled for DM management. Written consent obtained as well and another Link to Wellness brochure with contact information left at bedside. Inpatient RNCM aware.   Marthenia Rolling, MSN-Ed, RN,BSN [facility] Liaison [phone]

## 2015-12-06 NOTE — Telephone Encounter (Signed)
Dr. Acie Fredrickson has reviewed the records from the hospital in [place].  He advised that patient will need a repeat TEE due to inadequate pictures.  I left a message for patient to call the office.

## 2015-12-06 NOTE — Evaluation (Signed)
Physical Therapy Evaluation Patient Details Name: Isaiah [name] MRN: [id number] DOB: 09-24-1965 Today's Date: 12/06/2015   History of Present Illness  Patient is a 50 y/o male with hx of CVA, DM, HTN, sickle cell trait, PE and ESRD on HD presents for AVF Placement.  Clinical Impression  Patient presents with deficits from prior CVA recently finishing HHPT and transferring to OPPT. Tolerates gait training with min guard assist for safety. Tolerated higher level balance challenges with mild deviations in gait and Min A at times for support. Session limited as pt with HR up to 152 bpm. RN notified. Pt has support at home from mother and wife (who works). Pt appropriate for OPPT services. Encouraged daily ambulation with RN to maintain strength/mobility. Will follow acutely to maximize independence and mobility prior to return home.    Follow Up Recommendations Outpatient PT    Equipment Recommendations  None recommended by PT    Recommendations for Other Services       Precautions / Restrictions Precautions Precautions: Fall Restrictions Weight Bearing Restrictions: No      Mobility  Bed Mobility Overal bed mobility: Needs Assistance Bed Mobility: Supine to Sit;Sit to Supine     Supine to sit: Modified independent (Device/Increase time);HOB elevated Sit to supine: Modified independent (Device/Increase time);HOB elevated   General bed mobility comments: No assist needed. Able to scoot self up in bed using rails.  Transfers Overall transfer level: Needs assistance Equipment used: None Transfers: Sit to/from Stand Sit to Stand: Supervision         General transfer comment: Supervision for safety.   Ambulation/Gait Ambulation/Gait assistance: Min guard Ambulation Distance (Feet): 400 Feet Assistive device: None Gait Pattern/deviations: Step-through pattern;Decreased stride length;Staggering left;Staggering right;Drifts right/left   Gait velocity interpretation: Below  normal speed for age/gender General Gait Details: Mostly steady gait with mild deviations noted with higher level balance challenges.See balance section. HR up to 152 bpm during mobility.  Stairs            Wheelchair Mobility    Modified Rankin (Stroke Patients Only)       Balance Overall balance assessment: Needs assistance Sitting-balance support: Feet supported;No upper extremity supported Sitting balance-Leahy Scale: Good     Standing balance support: During functional activity Standing balance-Leahy Scale: Good               High level balance activites: Backward walking;Direction changes;Turns;Sudden stops;Head turns High Level Balance Comments: Tolerated above with mild deviations in gait but no overt LOB. Min A for backwards walking.  Standardized Balance Assessment Standardized Balance Assessment : Dynamic Gait Index   Dynamic Gait Index Level Surface: Mild Impairment Gait with Horizontal Head Turns: Mild Impairment Gait with Vertical Head Turns: Mild Impairment Gait and Pivot Turn: Normal Step Over Obstacle: Mild Impairment Step Around Obstacles: Normal       Pertinent Vitals/Pain Pain Assessment: Faces Faces Pain Scale: Hurts a little bit Pain Location: surgical site Pain Descriptors / Indicators: Sore Pain Intervention(s): Monitored during session    Home Living Family/patient expects to be discharged to:: Private residence Living Arrangements: Spouse/significant other Available Help at Discharge: Available 24 hours/day;Family (mother ) Type of Home: Apartment Home Access: Stairs to enter Entrance Stairs-Rails: Right Entrance Stairs-Number of Steps: 17 Home Layout: One level Home Equipment: Cane - single point;Shower seat      Prior Function Level of Independence: Independent with assistive device(s)         Comments: Uses SPC as needed. Just finished HHPT and about  to start OPPT     Hand Dominance   Dominant Hand: Right     Extremity/Trunk Assessment   Upper Extremity Assessment: Defer to OT evaluation           Lower Extremity Assessment: RLE deficits/detail RLE Deficits / Details: Functional strength demonstrated. Grossly ~4/5 throughout       Communication   Communication: Expressive difficulties (Some difficulty with word finding, aphasia)  Cognition Arousal/Alertness: Awake/alert Behavior During Therapy: WFL for tasks assessed/performed Overall Cognitive Status: Within Functional Limits for tasks assessed                      General Comments      Exercises        Assessment/Plan    PT Assessment Patient needs continued PT services  PT Diagnosis Difficulty walking   PT Problem List Decreased strength;Cardiopulmonary status limiting activity;Decreased balance;Decreased mobility  PT Treatment Interventions Balance training;Gait training;Stair training;Functional mobility training;Therapeutic activities;Therapeutic exercise;Patient/family education;Neuromuscular re-education   PT Goals (Current goals can be found in the Care Plan section) Acute Rehab PT Goals Patient Stated Goal: to return to driving, independence, start OPPT PT Goal Formulation: With patient Time For Goal Achievement: 12/20/15 Potential to Achieve Goals: Good    Frequency Min 3X/week   Barriers to discharge Inaccessible home environment stairs    Co-evaluation               End of Session Equipment Utilized During Treatment: Gait belt Activity Tolerance: Patient tolerated treatment well;Treatment limited secondary to medical complications (Comment) (tachycardia) Patient left: in bed;with call bell/phone within reach Nurse Communication: Mobility status;Other (comment) (HR)         Time: 1610-9604 PT Time Calculation (min) (ACUTE ONLY): 21 min   Charges:   PT Evaluation $PT Eval Moderate Complexity: 1 Procedure     PT G Codes:        Michaelia Beilfuss A Colson Barco 12/06/2015, 10:28 AM  Wray Kearns, PT, DPT [phone]

## 2015-12-06 NOTE — Progress Notes (Signed)
  [facility] Progress Note  Subjective  - POD #1  No complaints.   Objective Filed Vitals:   12/06/15 0553 12/06/15 1533  BP: 108/73 117/83  Pulse: 101 93  Temp: 97.6 F (36.4 C) 97.8 F (36.6 C)  Resp: 16 18    Intake/Output Summary (Last 24 hours) at 12/06/15 1552 Last data filed at 12/06/15 1534  Gross per 24 hour  Intake    360 ml  Output   2995 ml  Net  -2635 ml   Right AVF with faintly palpable thrill. Audible bruit.   Assessment/Planning: 50 y.o. male is s/p: right radial-cephalic AV fistula 1 Day Post-Op   Fistula is patent.  No signs of steal syndrome. Hx PE: On heparin bridge to coumadin.  Home when INR is therapeutic.   Alvia Grove 12/06/2015 3:52 PM --  Laboratory CBC    Component Value Date/Time   WBC 15.9* 12/06/2015 0320   WBC 17.1* 09/06/2015 1306   HGB 10.6* 12/06/2015 0320   HGB 11.3* 09/06/2015 1306   HCT 30.0* 12/06/2015 0320   HCT 32.8* 09/06/2015 1306   PLT 426* 12/06/2015 0320   PLT 322 09/06/2015 1306    BMET    Component Value Date/Time   NA 137 12/05/2015 1234   NA 138 09/06/2015 1306   K 3.3* 12/05/2015 1234   K 4.8 09/06/2015 1306   CL 103 12/05/2015 1234   CO2 25 12/05/2015 1234   CO2 15* 09/06/2015 1306   GLUCOSE 166* 12/05/2015 1234   GLUCOSE 98 09/06/2015 1306   BUN 31* 12/05/2015 1234   BUN 54.0* 09/06/2015 1306   CREATININE 5.60* 12/05/2015 1234   CREATININE 5.8* 09/06/2015 1306   CALCIUM 9.1 12/05/2015 1234   CALCIUM 7.6* 09/06/2015 1306   GFRNONAA 11* 12/05/2015 1234   GFRAA 12* 12/05/2015 1234    COAG Lab Results  Component Value Date   INR 1.36 12/06/2015   INR 1.40 12/05/2015   INR 1.65* 12/04/2015   PROTIME 16.8* 10/30/2014   No results found for: PTT  Antibiotics Anti-infectives    Start     Dose/Rate Route Frequency Ordered Stop   12/05/15 0815  [MAR Hold]  cefUROXime (ZINACEF) 1.5 g in dextrose 5 % 50 mL IVPB     (MAR Hold since 12/05/15 0728)   1.5 g 100 mL/hr  over 30 Minutes Intravenous To Short Stay 12/04/15 0711 12/05/15 0845       Virgina Jock, PA-C [facility] Office: [phone] Pager: [phone] 12/06/2015 3:52 PM

## 2015-12-06 NOTE — Progress Notes (Signed)
ANTICOAGULATION CONSULT NOTE - FOLLOW UP    HL = 0.52 (goal 0.3 - 0.7 units/mL) Heparin dosing weight = 90 kg   Assessment: 83 YOM with history of PE to continue on IV heparin while INR is sub-therapeutic.  Heparin level is therapeutic; no bleeding reported.  Patient has limited access and heparin levels have been drawn from the same arm as the infusion.  RN reported lab was drawn >6 inches from IV site.   Plan: - Continue heparin gtt at 1400 units/hr - F/U AM labs    Avaeh Ewer D. Mina Marble, PharmD, BCPS 12/06/2015, 8:31 PM

## 2015-12-07 ENCOUNTER — Encounter (HOSPITAL_COMMUNITY): Payer: Self-pay | Admitting: Vascular Surgery

## 2015-12-07 ENCOUNTER — Encounter: Payer: Self-pay | Admitting: Nurse Practitioner

## 2015-12-07 LAB — RENAL FUNCTION PANEL
Albumin: 3.2 g/dL — ABNORMAL LOW (ref 3.5–5.0)
Anion gap: 10 (ref 5–15)
BUN: 27 mg/dL — ABNORMAL HIGH (ref 6–20)
CO2: 23 mmol/L (ref 22–32)
Calcium: 8.8 mg/dL — ABNORMAL LOW (ref 8.9–10.3)
Chloride: 99 mmol/L — ABNORMAL LOW (ref 101–111)
Creatinine, Ser: 6.02 mg/dL — ABNORMAL HIGH (ref 0.61–1.24)
GFR calc Af Amer: 11 mL/min — ABNORMAL LOW (ref >60)
GFR calc non Af Amer: 10 mL/min — ABNORMAL LOW (ref >60)
Glucose, Bld: 86 mg/dL (ref 65–99)
Phosphorus: 4.7 mg/dL — ABNORMAL HIGH (ref 2.5–4.6)
Potassium: 3.3 mmol/L — ABNORMAL LOW (ref 3.5–5.1)
Sodium: 132 mmol/L — ABNORMAL LOW (ref 135–145)

## 2015-12-07 LAB — PROTIME-INR
INR: 1.18 (ref 0.00–1.49)
Prothrombin Time: 15.1 seconds (ref 11.6–15.2)

## 2015-12-07 LAB — GLUCOSE, CAPILLARY
Glucose-Capillary: 96 mg/dL (ref 65–99)
Glucose-Capillary: 71 mg/dL (ref 65–99)
Glucose-Capillary: 170 mg/dL — ABNORMAL HIGH (ref 65–99)
Glucose-Capillary: 147 mg/dL — ABNORMAL HIGH (ref 65–99)

## 2015-12-07 LAB — CBC
HCT: 27.7 % — ABNORMAL LOW (ref 39.0–52.0)
Hemoglobin: 10.1 g/dL — ABNORMAL LOW (ref 13.0–17.0)
MCH: 28.6 pg (ref 26.0–34.0)
MCHC: 36.5 g/dL — ABNORMAL HIGH (ref 30.0–36.0)
MCV: 78.5 fL (ref 78.0–100.0)
Platelets: 419 10*3/uL — ABNORMAL HIGH (ref 150–400)
RBC: 3.53 MIL/uL — ABNORMAL LOW (ref 4.22–5.81)
RDW: 17.3 % — ABNORMAL HIGH (ref 11.5–15.5)
WBC: 17.5 10*3/uL — ABNORMAL HIGH (ref 4.0–10.5)

## 2015-12-07 LAB — HEPARIN LEVEL (UNFRACTIONATED): Heparin Unfractionated: 0.52 IU/mL (ref 0.30–0.70)

## 2015-12-07 MED ORDER — WARFARIN SODIUM 2.5 MG PO TABS
12.5000 mg | ORAL_TABLET | Freq: Once | ORAL | Status: AC
  Administered 2015-12-07: 12.5 mg via ORAL
  Filled 2015-12-07 (×2): qty 1

## 2015-12-07 NOTE — Telephone Encounter (Signed)
Patient's wife called and I reviewed Dr. Elmarie Shiley advice with her.  She states patient is currently hospitalized and she would like to postpone this procedure for a while since the patient has had so much going on recently.  She requests a letter stating that the images were not sufficient be mailed to her for her records; she states she had multiple problems with the hospital in [place] where the patient was treated.  I advised her that I will discuss the letter with Dr. Acie Fredrickson and that she can call me back when she and patient are ready to proceed with TEE.  She verbalized understanding and agreement.

## 2015-12-07 NOTE — Progress Notes (Signed)
SLP Cancellation Note  Patient Details Name: [place] MRN: [id number] DOB: 1965/11/27   Cancelled treatment:       Reason Eval/Treat Not Completed: Patient at procedure or test/unavailable   Fiore Detjen,PAT, M.S., CCC-SLP 12/07/2015, 10:40 AM

## 2015-12-07 NOTE — Progress Notes (Addendum)
[facility] [place]  Subjective  - Doing well no symptoms of steal in right arm.   Objective 103/61 97 98.1 F (36.7 C) (Oral) 21 98%  Intake/Output Summary (Last 24 hours) at 12/07/15 1008 Last data filed at 12/06/15 1534  Gross per 24 hour  Intake    120 ml  Output      0 ml  Net    120 ml    Right forearm palpable thrill at anastomosis av fistula Grip 3+/5, baseline weakness with trimmer secondary to stroke Currently on HD via right IJ cath   Assessment/Planning: POD# 2 s/p Right Radial Cephalic AV fistula  Stable disposition pending therapeutic INR prior to discharge home. INR 1.18 he received 10 mg last night per pharmacy dosing   Theda Sers, [place] 12/07/2015 10:08 AM -- Small fistula but thrill present Home when INR 2 Leukocytosis but afebrile need to keep catheter in mind as possible source  Ruta Hinds, MD [facility] [place]: [phone] Pager: [phone]  Laboratory Lab Results:  Recent Labs  12/06/15 0320 12/07/15 0640  WBC 15.9* 17.5*  HGB 10.6* 10.1*  HCT 30.0* 27.7*  PLT 426* 419*   BMET  Recent Labs  12/05/15 1234 12/07/15 0833  NA 137 132*  K 3.3* 3.3*  CL 103 99*  CO2 25 23  GLUCOSE 166* 86  BUN 31* 27*  CREATININE 5.60* 6.02*  CALCIUM 9.1 8.8*    COAG Lab Results  Component Value Date   INR 1.18 12/07/2015   INR 1.36 12/06/2015   INR 1.40 12/05/2015   PROTIME 16.8* 10/30/2014   No results found for: PTT

## 2015-12-07 NOTE — Telephone Encounter (Signed)
Letter has been completed and placed in outgoing mail

## 2015-12-07 NOTE — Progress Notes (Signed)
   12/07/15 1529  PT Visit Information  Last PT Received On 12/07/15  Assistance Needed +1  Reason Eval/Treat Not Completed Fatigue/lethargy limiting ability to participate;Patient declined, no reason specified (Pt resting on arrival.  pt adamant to refuse PT services at this time.  )  History of Present Illness Patient is a 50 y/o male with hx of CVA, DM, HTN, sickle cell trait, PE and ESRD on HD presents for AVF Placement.  Governor Rooks, PTA pager [phone]

## 2015-12-07 NOTE — Progress Notes (Signed)
ANTICOAGULATION CONSULT NOTE - FOLLOW UP  HL = 0.52 (goal 0.3 - 0.7 units/mL) Heparin dosing weight = 90 kg  Assessment: 51 YOM with history of PE to continue on IV heparin while INR is sub-therapeutic. Heparin level remains therapeutic on 1400 units/h; no bleeding reported. Patient has limited access and heparin levels have been drawn from the same arm as the infusion. INR remains subtherapeutic 1.18 after warfarin restarted last night. Hg low stable, plt stable 419.   Plan: - Hheparin gtt at 1400 units/h while INR subtherapeutic - Warfarin 12.89m x1 dose tonight - Daily HL/INR/CBC - Monitor s/sx bleeding  HElicia Lamp PharmD, BZachary Asc Partners LLCClinical Pharmacist Pager [phone]/31/2017 11:37 AM

## 2015-12-08 LAB — GLUCOSE, CAPILLARY
Glucose-Capillary: 93 mg/dL (ref 65–99)
Glucose-Capillary: 129 mg/dL — ABNORMAL HIGH (ref 65–99)
Glucose-Capillary: 158 mg/dL — ABNORMAL HIGH (ref 65–99)
Glucose-Capillary: 167 mg/dL — ABNORMAL HIGH (ref 65–99)

## 2015-12-08 LAB — CBC
HCT: 27 % — ABNORMAL LOW (ref 39.0–52.0)
Hemoglobin: 9.5 g/dL — ABNORMAL LOW (ref 13.0–17.0)
MCH: 27.8 pg (ref 26.0–34.0)
MCHC: 35.2 g/dL (ref 30.0–36.0)
MCV: 78.9 fL (ref 78.0–100.0)
Platelets: 436 10*3/uL — ABNORMAL HIGH (ref 150–400)
RBC: 3.42 MIL/uL — ABNORMAL LOW (ref 4.22–5.81)
RDW: 17 % — ABNORMAL HIGH (ref 11.5–15.5)
WBC: 14.2 10*3/uL — ABNORMAL HIGH (ref 4.0–10.5)

## 2015-12-08 LAB — PROTIME-INR
INR: 1.25 (ref 0.00–1.49)
Prothrombin Time: 15.8 seconds — ABNORMAL HIGH (ref 11.6–15.2)

## 2015-12-08 LAB — HEPARIN LEVEL (UNFRACTIONATED)
Heparin Unfractionated: 0.1 IU/mL — ABNORMAL LOW (ref 0.30–0.70)
Heparin Unfractionated: 0.82 IU/mL — ABNORMAL HIGH (ref 0.30–0.70)

## 2015-12-08 MED ORDER — WARFARIN SODIUM 2.5 MG PO TABS
12.5000 mg | ORAL_TABLET | Freq: Once | ORAL | Status: AC
  Administered 2015-12-08: 12.5 mg via ORAL
  Filled 2015-12-08: qty 1

## 2015-12-08 NOTE — Progress Notes (Addendum)
[facility]   Subjective  - Doing ok over all.   Objective 103/71 92 98.2 F (36.8 C) (Oral) 18 100%  Intake/Output Summary (Last 24 hours) at 12/08/15 0729 Last data filed at 12/07/15 1216  Gross per 24 hour  Intake      0 ml  Output   2456 ml  Net  -2456 ml    Palpable thrill right av fistula   Assessment/Planning: POD # 3  s/p Right Radial Cephalic AV fistula  Stable disposition pending therapeutic INR prior to discharge home. INR 1.25 he received 10 mg last night per pharmacy dosing   Laurence Slate [facility] 12/08/2015 7:29 AM --  Laboratory Lab Results:  Recent Labs  12/07/15 0640 12/08/15 0321  WBC 17.5* 14.2*  HGB 10.1* 9.5*  HCT 27.7* 27.0*  PLT 419* 436*   BMET  Recent Labs  12/05/15 1234 12/07/15 0833  NA 137 132*  K 3.3* 3.3*  CL 103 99*  CO2 25 23  GLUCOSE 166* 86  BUN 31* 27*  CREATININE 5.60* 6.02*  CALCIUM 9.1 8.8*    COAG Lab Results  Component Value Date   INR 1.25 12/08/2015   INR 1.18 12/07/2015   INR 1.36 12/06/2015   PROTIME 16.8* 10/30/2014   No results found for: PTT    I have examined the patient, reviewed and agree with above.Does have patent radiocephalic fistula. Size is acceptable with compression of the mid forearm cephalic vein. Home when therapeutic on Coumadin  Curt Jews, MD 12/08/2015 10:28 AM

## 2015-12-08 NOTE — Progress Notes (Signed)
Patient ID: [place], male   DOB: 1966-04-12, 50 y.o.   MRN: [id number]  [facility] Progress Note    Subjective:   Feels well no c/o   Objective:   BP 103/71 mmHg  Pulse 92  Temp(Src) 98.2 F (36.8 C) (Oral)  Resp 18  Ht 5' 10"  (1.778 m)  Wt 85.5 kg (188 lb 7.9 oz)  BMI 27.05 kg/m2  SpO2 100%  Intake/Output: I/O last 3 completed shifts: In: -  Out: 2456 [Other:2456]   Intake/Output this shift:    Weight change:   Physical Exam: Gen:NAD CVS:no rub Resp:cta YOF:VWAQLR Ext:no edema, R AVF +T/B, small caliber  Labs: BMET  Recent Labs Lab 12/03/15 1207 12/05/15 0300 12/05/15 1234 12/07/15 0833  NA 135 134* 137 132*  K 2.7* 3.5 3.3* 3.3*  CL 99* 99* 103 99*  CO2 23 24 25 23   GLUCOSE 221* 149* 166* 86  BUN 52* 30* 31* 27*  CREATININE 7.03* 5.69* 5.60* 6.02*  ALBUMIN 3.3*  --  3.1* 3.2*  CALCIUM 8.7* 9.1 9.1 8.8*  PHOS  --   --  4.5 4.7*   CBC  Recent Labs Lab 12/05/15 1234 12/06/15 0320 12/07/15 0640 12/08/15 0321  WBC 14.3* 15.9* 17.5* 14.2*  HGB 10.4* 10.6* 10.1* 9.5*  HCT 28.6* 30.0* 27.7* 27.0*  MCV 79.2 78.7 78.5 78.9  PLT 451* 426* 419* 436*    @IMGRELPRIORS @ Medications:    . aspirin EC  81 mg Oral Daily  . calcitRIOL  0.5 mcg Oral Q M,W,F-HD  . fluticasone furoate-vilanterol  1 puff Inhalation Daily  . heparin  1,000 Units Dialysis Once in dialysis  . insulin aspart  0-15 Units Subcutaneous TID WC  . insulin glargine  25 Units Subcutaneous Q2200  . midodrine  2.5 mg Oral BID WC  . multivitamin  1 tablet Oral QHS  . pantoprazole  40 mg Oral BID  . warfarin  12.5 mg Oral ONCE-1800  . Warfarin - Pharmacist Dosing Inpatient   Does not apply q1800    Dialysis Orders: Center: [facility]  MonWedFri, 4 hrs 15 min, 180NRe Optiflux, BFR 400, DFR Manual 800 mL/min, EDW 88 (kg), Dialysate 2.0 K, 2.25 Ca, UFR Profile: None, Sodium Model: None, Access: RIJ perm catheter tunneled, right cimino AVF (placed 12/05/15) Mircera: 75  mcg IV Q 2 weeks (last dose 11/21/15) Calcitriol: 0.25 mcg PO Q MWF   Assessment/ Plan:   1. PE/DVT- on heparin bridge and on coumadin, awaiting therapeutic INR 2. Vascular access- s/p R Cimino AVF placement 12/05/15 3. ESRD: continue with HD qMWF while he remains an inpatient 4. Anemia:on ESA 5. CKD-MBD:cont with binders 6. Nutrition: stable 7. Hypertension:stable  Donetta Potts, MD [place] Pager [phone] 12/08/2015, 12:13 PM

## 2015-12-08 NOTE — Progress Notes (Addendum)
ANTICOAGULATION CONSULT NOTE - FOLLOW UP  HL = 0.82 (goal 0.3 - 0.7 units/mL) Heparin dosing weight = 90 kg  Assessment: 47 YOM with history of PE to continue on IV heparin while INR is sub-therapeutic. HL now supratherapeutic (0.82) after increase to 1550 units/h; no bleeding. RN this AM said drip was off for a period of time last night so low level was probably not accurate. INR remains subtherapeutic as expected 1.25 after warfarin restarted 3/30 (held 3/25-3/29). Hg low stable 9.5, plt stable 436.  Patient has limited access and heparin levels have been drawn from the same arm as the infusion.   Plan: - Decrease heparin back to 1400 units/h (previously therapeutic) - Warfarin 12.47m x1 dose tonight - 8h HL, Daily HL/INR/CBC - Monitor s/sx bleeding  HElicia Lamp PharmD, BCPS Clinical Pharmacist Pager [phone]/09/2015 11:20 AM

## 2015-12-08 NOTE — Progress Notes (Signed)
ANTICOAGULATION CONSULT NOTE - Follow Up Consult  Pharmacy Consult for Heparin  Indication: Hx PE  Allergies  Allergen Reactions  . Codeine Rash and Other (See Comments)    Unknown reaction (patient says it was more serious than just a rash, but he can't remember what happened)   Patient Measurements: Height: 5' 10"  (177.8 cm) Weight: 194 lb 7.1 oz (88.2 kg) IBW/kg (Calculated) : 73  Vital Signs: Temp: 98.4 F (36.9 C) (03/31 2043) Temp Source: Oral (03/31 2043) BP: 96/69 mmHg (03/31 2043) Pulse Rate: 87 (03/31 2043)  Labs:  Recent Labs  12/05/15 1234  12/06/15 0320 12/06/15 1900 12/07/15 0640 12/07/15 0833 12/08/15 0321  HGB 10.4*  --  10.6*  --  10.1*  --  9.5*  HCT 28.6*  --  30.0*  --  27.7*  --  27.0*  PLT 451*  --  426*  --  419*  --  436*  LABPROT  --   --  16.9*  --  15.1  --  15.8*  INR  --   --  1.36  --  1.18  --  1.25  HEPARINUNFRC  --   < >  --  0.52 0.52  --  0.10*  CREATININE 5.60*  --   --   --   --  6.02*  --   < > = values in this interval not displayed.  Estimated Creatinine Clearance: 16.1 mL/min (by C-G formula based on Cr of 6.02).   Assessment: Heparin while INR is <2, HL is sub-therapeutic this AM, IV beeping some overnight (will be less aggressive with rate increase).   Goal of Therapy:  Heparin level 0.3-0.7 units/ml Monitor platelets by anticoagulation protocol: Yes   Plan:  -Increase heparin to 1550 units/hr -1300 HL  Narda Bonds 12/08/2015,4:29 AM

## 2015-12-09 LAB — PROTIME-INR
INR: 1.4 (ref 0.00–1.49)
Prothrombin Time: 17.3 seconds — ABNORMAL HIGH (ref 11.6–15.2)

## 2015-12-09 LAB — CBC
HCT: 26.1 % — ABNORMAL LOW (ref 39.0–52.0)
Hemoglobin: 9.5 g/dL — ABNORMAL LOW (ref 13.0–17.0)
MCH: 28.8 pg (ref 26.0–34.0)
MCHC: 36.4 g/dL — ABNORMAL HIGH (ref 30.0–36.0)
MCV: 79.1 fL (ref 78.0–100.0)
Platelets: 417 10*3/uL — ABNORMAL HIGH (ref 150–400)
RBC: 3.3 MIL/uL — ABNORMAL LOW (ref 4.22–5.81)
RDW: 17.1 % — ABNORMAL HIGH (ref 11.5–15.5)
WBC: 16.3 10*3/uL — ABNORMAL HIGH (ref 4.0–10.5)

## 2015-12-09 LAB — GLUCOSE, CAPILLARY
Glucose-Capillary: 89 mg/dL (ref 65–99)
Glucose-Capillary: 144 mg/dL — ABNORMAL HIGH (ref 65–99)
Glucose-Capillary: 193 mg/dL — ABNORMAL HIGH (ref 65–99)
Glucose-Capillary: 72 mg/dL (ref 65–99)

## 2015-12-09 LAB — HEPARIN LEVEL (UNFRACTIONATED)
Heparin Unfractionated: 0.39 IU/mL (ref 0.30–0.70)
Heparin Unfractionated: 0.5 IU/mL (ref 0.30–0.70)

## 2015-12-09 MED ORDER — WARFARIN SODIUM 2.5 MG PO TABS
12.5000 mg | ORAL_TABLET | Freq: Once | ORAL | Status: AC
  Administered 2015-12-09: 12.5 mg via ORAL
  Filled 2015-12-09: qty 1

## 2015-12-09 MED ORDER — DARBEPOETIN ALFA 100 MCG/0.5ML IJ SOSY
100.0000 ug | PREFILLED_SYRINGE | INTRAMUSCULAR | Status: DC
  Administered 2015-12-10: 100 ug via INTRAVENOUS
  Filled 2015-12-09: qty 0.5

## 2015-12-09 NOTE — Progress Notes (Addendum)
ANTICOAGULATION CONSULT NOTE - FOLLOW UP  HL = 0.5 (goal 0.3 - 0.7 units/mL) Heparin dosing weight = 90 kg  Assessment: 66 YOM with history of PE to continue on IV heparin while INR is sub-therapeutic. HL therapeutic x2 (0.5) on 1400 units/h; no bleeding. INR remains subtherapeutic as expected 1.25>1.4 after warfarin held 3/25-3/29. Hg low stable 9.5, plt stable 417.  Limited access - HLs drawn from the same arm as infusion.  - Coumadin PTA: 7.32m daily  Plan: - Cont heparin 1400 units/hr - Warfarin 12.524mx1 dose tonight - Daily HL/INR/CBC - Monitor s/sx bleeding  HaElicia LampPharmD, BCPS Clinical Pharmacist Pager [phone]/10/2015 11:40 AM

## 2015-12-09 NOTE — Progress Notes (Signed)
Subjective: Interval History: none.. No complaints.  Objective: Vital signs in last 24 hours: Temp:  [97.6 F (36.4 C)-97.9 F (36.6 C)] 97.6 F (36.4 C) (04/02 0506) Pulse Rate:  [58-93] 58 (04/02 0506) Resp:  [18-20] 20 (04/02 0506) BP: (113-126)/(66-83) 126/66 mmHg (04/02 0506) SpO2:  [95 %-100 %] 100 % (04/02 0506) Weight:  [190 lb 7.6 oz (86.4 kg)] 190 lb 7.6 oz (86.4 kg) (04/02 0506)  Intake/Output from previous day: 04/01 0701 - 04/02 0700 In: 360 [P.O.:360] Out: -  Intake/Output this shift:    Right wrist incision healing. Cephalic vein fistula patent.  Lab Results:  Recent Labs  12/08/15 0321 12/09/15 0020  WBC 14.2* 16.3*  HGB 9.5* 9.5*  HCT 27.0* 26.1*  PLT 436* 417*   BMET  Recent Labs  12/07/15 0833  NA 132*  K 3.3*  CL 99*  CO2 23  GLUCOSE 86  BUN 27*  CREATININE 6.02*  CALCIUM 8.8*    Studies/Results: No results found. Anti-infectives: Anti-infectives    Start     Dose/Rate Route Frequency Ordered Stop   12/05/15 0815  [MAR Hold]  cefUROXime (ZINACEF) 1.5 g in dextrose 5 % 50 mL IVPB     (MAR Hold since 12/05/15 0728)   1.5 g 100 mL/hr over 30 Minutes Intravenous To Short Stay 12/04/15 0711 12/05/15 0845      Assessment/Plan: s/p Procedure(s): INSERTION OF ARTERIOVENOUS (AV) GORE-TEX GRAFT ARM (Right) Stable overall. INR is 1.4 today. Continued inversion to [place] Coumadin with pharmacy dosing   LOS: 6 days   Marbeth Smedley 12/09/2015, 9:04 AM

## 2015-12-09 NOTE — Progress Notes (Signed)
ANTICOAGULATION CONSULT NOTE - Follow Up Consult  Pharmacy Consult for Heparin  Indication: Hx PE  Allergies  Allergen Reactions  . Codeine Rash and Other (See Comments)    Unknown reaction (patient says it was more serious than just a rash, but he can't remember what happened)   Patient Measurements: Height: 5' 10"  (177.8 cm) Weight: 188 lb 7.9 oz (85.5 kg) IBW/kg (Calculated) : 73  Vital Signs: Temp: 97.9 F (36.6 C) (04/01 2127) Temp Source: Oral (04/01 2127) BP: 113/66 mmHg (04/01 2127) Pulse Rate: 88 (04/01 2127)  Labs:  Recent Labs  12/07/15 0640 12/07/15 0833 12/08/15 0321 12/08/15 1237 12/09/15 0020  HGB 10.1*  --  9.5*  --  9.5*  HCT 27.7*  --  27.0*  --  26.1*  PLT 419*  --  436*  --  417*  LABPROT 15.1  --  15.8*  --  17.3*  INR 1.18  --  1.25  --  1.40  HEPARINUNFRC 0.52  --  0.10* 0.82* 0.39  CREATININE  --  6.02*  --   --   --     Estimated Creatinine Clearance: 14.8 mL/min (by C-G formula based on Cr of 6.02).   Assessment: Heparin while INR is <2, HL is therapeutic x 1 after rate decrease  Goal of Therapy:  Heparin level 0.3-0.7 units/ml Monitor platelets by anticoagulation protocol: Yes   Plan:  -Cont heparin at 1400 units/hr -1000 HL  Isaiah [name] 12/09/2015,2:18 AM

## 2015-12-10 LAB — CBC
HCT: 27 % — ABNORMAL LOW (ref 39.0–52.0)
HCT: 25.2 % — ABNORMAL LOW (ref 39.0–52.0)
Hemoglobin: 9.8 g/dL — ABNORMAL LOW (ref 13.0–17.0)
Hemoglobin: 9 g/dL — ABNORMAL LOW (ref 13.0–17.0)
MCH: 28.5 pg (ref 26.0–34.0)
MCH: 27.8 pg (ref 26.0–34.0)
MCHC: 36.3 g/dL — ABNORMAL HIGH (ref 30.0–36.0)
MCHC: 35.7 g/dL (ref 30.0–36.0)
MCV: 78.5 fL (ref 78.0–100.0)
MCV: 77.8 fL — ABNORMAL LOW (ref 78.0–100.0)
Platelets: 408 10*3/uL — ABNORMAL HIGH (ref 150–400)
Platelets: 409 10*3/uL — ABNORMAL HIGH (ref 150–400)
RBC: 3.44 MIL/uL — ABNORMAL LOW (ref 4.22–5.81)
RBC: 3.24 MIL/uL — ABNORMAL LOW (ref 4.22–5.81)
RDW: 17.1 % — ABNORMAL HIGH (ref 11.5–15.5)
RDW: 16.8 % — ABNORMAL HIGH (ref 11.5–15.5)
WBC: 16.4 10*3/uL — ABNORMAL HIGH (ref 4.0–10.5)
WBC: 14.4 10*3/uL — ABNORMAL HIGH (ref 4.0–10.5)

## 2015-12-10 LAB — PROTIME-INR
INR: 1.9 — ABNORMAL HIGH (ref 0.00–1.49)
Prothrombin Time: 21.7 s — ABNORMAL HIGH (ref 11.6–15.2)

## 2015-12-10 LAB — GLUCOSE, CAPILLARY
Glucose-Capillary: 91 mg/dL (ref 65–99)
Glucose-Capillary: 116 mg/dL — ABNORMAL HIGH (ref 65–99)
Glucose-Capillary: 101 mg/dL — ABNORMAL HIGH (ref 65–99)

## 2015-12-10 LAB — RENAL FUNCTION PANEL
Albumin: 3 g/dL — ABNORMAL LOW (ref 3.5–5.0)
Anion gap: 11 (ref 5–15)
BUN: 38 mg/dL — ABNORMAL HIGH (ref 6–20)
CO2: 22 mmol/L (ref 22–32)
Calcium: 8.9 mg/dL (ref 8.9–10.3)
Chloride: 99 mmol/L — ABNORMAL LOW (ref 101–111)
Creatinine, Ser: 7.43 mg/dL — ABNORMAL HIGH (ref 0.61–1.24)
GFR calc Af Amer: 9 mL/min — ABNORMAL LOW (ref >60)
GFR calc non Af Amer: 7 mL/min — ABNORMAL LOW (ref >60)
Glucose, Bld: 112 mg/dL — ABNORMAL HIGH (ref 65–99)
Phosphorus: 5 mg/dL — ABNORMAL HIGH (ref 2.5–4.6)
Potassium: 4.1 mmol/L (ref 3.5–5.1)
Sodium: 132 mmol/L — ABNORMAL LOW (ref 135–145)

## 2015-12-10 LAB — HEPARIN LEVEL (UNFRACTIONATED): Heparin Unfractionated: 0.36 [IU]/mL (ref 0.30–0.70)

## 2015-12-10 MED ORDER — DARBEPOETIN ALFA 100 MCG/0.5ML IJ SOSY
PREFILLED_SYRINGE | INTRAMUSCULAR | Status: AC
  Filled 2015-12-10: qty 0.5

## 2015-12-10 MED ORDER — HEPARIN SODIUM (PORCINE) 1000 UNIT/ML DIALYSIS: 20.0000 [IU]/kg | INTRAMUSCULAR | Status: DC | PRN

## 2015-12-10 MED ORDER — ALTEPLASE 2 MG IJ SOLR: 4.0000 mg | Freq: Once | INTRAMUSCULAR | Status: DC

## 2015-12-10 MED ORDER — WARFARIN SODIUM 5 MG PO TABS
5.0000 mg | ORAL_TABLET | Freq: Once | ORAL | Status: AC
  Administered 2015-12-10: 5 mg via ORAL
  Filled 2015-12-10: qty 1

## 2015-12-10 MED ORDER — ALTEPLASE 2 MG IJ SOLR
INTRAMUSCULAR | Status: AC
  Filled 2015-12-10: qty 2

## 2015-12-10 MED ORDER — CALCITRIOL 0.5 MCG PO CAPS
ORAL_CAPSULE | ORAL | Status: AC
  Filled 2015-12-10: qty 1

## 2015-12-10 MED ORDER — TRAMADOL HCL 50 MG PO TABS: 50.0000 mg | ORAL_TABLET | Freq: Four times a day (QID) | ORAL | Status: DC | PRN

## 2015-12-10 NOTE — Progress Notes (Signed)
Hemodialysis: Tried to dialyze patient but catheter clogged, TPA done per protocol. Report given to [place], [place]

## 2015-12-10 NOTE — Progress Notes (Signed)
Subjective:  On hd no cos / noted plans for dc   Objective Vital signs in last 24 hours: Filed Vitals:   12/09/15 1500 12/09/15 2106 12/10/15 0409 12/10/15 0958  BP: 101/65 113/70 108/70   Pulse: 87 83 87   Temp: 97.6 F (36.4 C) 98.3 F (36.8 C) 98.7 F (37.1 C)   TempSrc: Oral Oral Oral   Resp: 18 16 16    Height:      Weight:   92.8 kg (204 lb 9.4 oz)   SpO2: 99% 96% 95% 93%   Weight change: 6.4 kg (14 lb 1.8 oz)  Physical Exam: Gen:NAD/ AA M  On hd CVS: RRR, no rub Resp:cta bilat  Abd: SOFT NT / ND  Ext:no pedal  edema,  Dialysis Access: R AVF +bruit / R IJ perm cath  Patent onHD]  OP Dialysis Orders: Center: [facility]  MonWedFri, 4 hrs 15 min, 180NRe Optiflux, BFR 400, DFR Manual 800 mL/min, EDW 88 (kg), Dialysate 2.0 K, 2.25 Ca, UFR Profile: None, Sodium Model: None, Access: RIJ perm catheter tunneled, right cimino AVF (placed 12/05/15) Mircera: 75 mcg IV Q 2 weeks (last dose 11/21/15) Calcitriol: 0.25 mcg PO Q MWF  Problem/Plan: 1. PE/DVT- on heparin bridge and on coumadin, INR =1.9  Per [facility] DC 2. Vascular access- s/p R Cimino AVF placement 12/05/15 3. ESRD: continue with HD qMWF 4. Anemia:on ESA 5. CKD-MBD:cont with binders 6. HTN/volume - on low dose Midodrine / no vol issue no changes in edw at [place], PA-C [place] [phone] 12/10/2015,1:45 PM  LOS: 7 days   Pt seen, examined and agree w A/P as above.  Kelly Splinter MD [facility] pager [phone]    cell [phone] 12/10/2015, 3:04 PM    Labs: Basic Metabolic Panel:  Recent Labs Lab 12/05/15 1234 12/07/15 0833 12/10/15 0850  NA 137 132* 132*  K 3.3* 3.3* 4.1  CL 103 99* 99*  CO2 25 23 22   GLUCOSE 166* 86 112*  BUN 31* 27* 38*  CREATININE 5.60* 6.02* 7.43*  CALCIUM 9.1 8.8* 8.9  PHOS 4.5 4.7* 5.0*   Liver Function Tests:  Recent Labs Lab 12/05/15 1234 12/07/15 0833 12/10/15 0850  ALBUMIN 3.1* 3.2* 3.0*   No results for input(s): LIPASE,  AMYLASE in the last 168 hours. No results for input(s): AMMONIA in the last 168 hours. CBC:  Recent Labs Lab 12/07/15 0640 12/08/15 0321 12/09/15 0020 12/10/15 0720 12/10/15 0850  WBC 17.5* 14.2* 16.3* 16.4* 14.4*  HGB 10.1* 9.5* 9.5* 9.8* 9.0*  HCT 27.7* 27.0* 26.1* 27.0* 25.2*  MCV 78.5 78.9 79.1 78.5 77.8*  PLT 419* 436* 417* 408* 409*   Cardiac Enzymes: No results for input(s): CKTOTAL, CKMB, CKMBINDEX, TROPONINI in the last 168 hours. CBG:  Recent Labs Lab 12/09/15 1140 12/09/15 1615 12/09/15 2109 12/10/15 0617 12/10/15 1131  GLUCAP 144* 193* 72 91 116*    Studies/Results: No results found. Medications: . sodium chloride 10 mL/hr at 12/06/15 2344  . heparin 1,400 Units/hr (12/10/15 1032)   . alteplase      . alteplase      . alteplase  4 mg Intracatheter Once  . aspirin EC  81 mg Oral Daily  . calcitRIOL  0.5 mcg Oral Q M,W,F-HD  . darbepoetin (ARANESP) injection - DIALYSIS  100 mcg Intravenous Q Mon-HD  . fluticasone furoate-vilanterol  1 puff Inhalation Daily  . heparin  1,000 Units Dialysis Once in dialysis  . insulin aspart  0-15 Units  Subcutaneous TID WC  . insulin glargine  25 Units Subcutaneous Q2200  . midodrine  2.5 mg Oral BID WC  . multivitamin  1 tablet Oral QHS  . pantoprazole  40 mg Oral BID  . warfarin  5 mg Oral ONCE-1800  . Warfarin - Pharmacist Dosing Inpatient   Does not apply [phone]

## 2015-12-10 NOTE — Progress Notes (Signed)
SLP Cancellation Note  Patient Details Name: Isaiah [name] MRN: [id number] DOB: Nov 16, 1965   Cancelled treatment:       Reason Eval/Treat Not Completed: Patient declined, due to report of him returning to dialysis shortly given medical issues which prohibited dialysis earlier. Will continue effort as time allows.   Gunnar Fusi, M.A., CCC-SLP [phone]   [place] 12/10/2015, 9:42 AM

## 2015-12-10 NOTE — Progress Notes (Signed)
ANTICOAGULATION CONSULT NOTE - Follow Up Consult  Pharmacy Consult for Heparin  Indication: Hx PE  Allergies  Allergen Reactions  . Codeine Rash and Other (See Comments)    Unknown reaction (patient says it was more serious than just a rash, but he can't remember what happened)   Patient Measurements: Height: 5' 10"  (177.8 cm) Weight: 204 lb 9.4 oz (92.8 kg) IBW/kg (Calculated) : 73  Vital Signs: Temp: 98.7 F (37.1 C) (04/03 0409) Temp Source: Oral (04/03 0409) BP: 108/70 mmHg (04/03 0409) Pulse Rate: 87 (04/03 0409)  Labs:  Recent Labs  12/08/15 0321  12/09/15 0020 12/09/15 1201 12/10/15 0404 12/10/15 0720 12/10/15 0850  HGB 9.5*  --  9.5*  --   --  9.8* 9.0*  HCT 27.0*  --  26.1*  --   --  27.0* 25.2*  PLT 436*  --  417*  --   --  408* 409*  LABPROT 15.8*  --  17.3*  --  21.7*  --   --   INR 1.25  --  1.40  --  1.90*  --   --   HEPARINUNFRC 0.10*  < > 0.39 0.50 0.36  --   --   < > = values in this interval not displayed.  Estimated Creatinine Clearance: 16.4 mL/min (by C-G formula based on Cr of 6.02).   Assessment: 82 YOM with history of PE to continue on IV heparin while INR is sub-therapeutic. -heparin level= 0.36 and INR= 1.9 (up from 1.4)  Home coumadin dose: 7.4m/d  Goal of Therapy:  Heparin level 0.3-0.7 units/ml Monitor platelets by anticoagulation protocol: Yes   Plan:  -Coumadin 5359mx1 (anticipate further INR trend up after 3 doses for 12.59m39m-Consider resuming 7.59mg36mily on 4/4 -Cont heparin at 1400 units/hr -Daily heparin level and CBC  AndrHildred Laserarm D 12/10/2015 9:02 AM

## 2015-12-10 NOTE — Progress Notes (Addendum)
[facility] [place]  Subjective  - No new complaints.   Objective 108/70 87 98.7 F (37.1 C) (Oral) 16 95%  Intake/Output Summary (Last 24 hours) at 12/10/15 0714 Last data filed at 12/09/15 1550  Gross per 24 hour  Intake    480 ml  Output      0 ml  Net    480 ml   Right av fistula palpable thrill Incision is healing well   Assessment/Planning: Creation right av fistula  INR 1.9 today after 12.5 mg dose of coumadin Possible to D/C home he takes 7.5 mg daily at home will discuss discharge with Dr. Carvel [name], Isaiah [name] [name] 12/10/2015 7:14 AM -- Agree with above.  Thrill in fistula.  Incision essentially healed. Catheter apparently not functioning for dialysis today.  Will need to make sure this is working prior to d/c home  Ruta Hinds, MD [facility] [place]: [phone] Pager: [phone]  Laboratory Lab Results:  Recent Labs  12/08/15 0321 12/09/15 0020  WBC 14.2* 16.3*  HGB 9.5* 9.5*  HCT 27.0* 26.1*  PLT 436* 417*   BMET  Recent Labs  12/07/15 0833  NA 132*  K 3.3*  CL 99*  CO2 23  GLUCOSE 86  BUN 27*  CREATININE 6.02*  CALCIUM 8.8*    COAG Lab Results  Component Value Date   INR 1.90* 12/10/2015   INR 1.40 12/09/2015   INR 1.25 12/08/2015   PROTIME 16.8* 10/30/2014   No results found for: PTT

## 2015-12-10 NOTE — Progress Notes (Signed)
PT Cancellation Note  Patient Details Name: Isaiah [name] MRN: [id number] DOB: 11-Feb-1966   Cancelled Treatment:    Reason Eval/Treat Not Completed: Medical issues which prohibited therapy; just back from dialysis where they had to use TPA to due to clogged catheter.  Planning to return in an hour.  Will attempt again later as time permits.  Discussed plan with pt for balance/stairs and he reports he "has already done all that."   Reginia Naas 12/10/2015, 9:23 AM  Magda Kiel, [place] 12/10/2015

## 2015-12-10 NOTE — Progress Notes (Deleted)
Hemodialysis: Tried to dialyze patient but catheter is clogged. TPA done per protocol.

## 2015-12-10 NOTE — Progress Notes (Signed)
Notified Kim with vascular that pt HD has been delayed today due to port being clogged.  She advised to hold off on D/C until HD completed.  If there are any issues with dialysis today we will likely hold pt here again tonight.  Will page her when pt returns from dialysis, which will be later this afternoon.

## 2015-12-11 ENCOUNTER — Encounter: Payer: Self-pay | Admitting: Physical Medicine & Rehabilitation

## 2015-12-11 ENCOUNTER — Ambulatory Visit (HOSPITAL_BASED_OUTPATIENT_CLINIC_OR_DEPARTMENT_OTHER): Payer: Medicare Other | Admitting: Physical Medicine & Rehabilitation

## 2015-12-11 ENCOUNTER — Encounter: Payer: BLUE CROSS/BLUE SHIELD | Attending: Physical Medicine & Rehabilitation

## 2015-12-11 VITALS — BP 117/73 | HR 105

## 2015-12-11 DIAGNOSIS — Z992 Dependence on renal dialysis: Secondary | ICD-10-CM | POA: Insufficient documentation

## 2015-12-11 DIAGNOSIS — I69391 Dysphagia following cerebral infarction: Secondary | ICD-10-CM

## 2015-12-11 DIAGNOSIS — G8111 Spastic hemiplegia affecting right dominant side: Secondary | ICD-10-CM | POA: Diagnosis not present

## 2015-12-11 DIAGNOSIS — I120 Hypertensive chronic kidney disease with stage 5 chronic kidney disease or end stage renal disease: Secondary | ICD-10-CM | POA: Diagnosis not present

## 2015-12-11 DIAGNOSIS — I69359 Hemiplegia and hemiparesis following cerebral infarction affecting unspecified side: Secondary | ICD-10-CM

## 2015-12-11 DIAGNOSIS — N185 Chronic kidney disease, stage 5: Secondary | ICD-10-CM | POA: Insufficient documentation

## 2015-12-11 DIAGNOSIS — N179 Acute kidney failure, unspecified: Secondary | ICD-10-CM | POA: Insufficient documentation

## 2015-12-11 DIAGNOSIS — E875 Hyperkalemia: Secondary | ICD-10-CM | POA: Diagnosis not present

## 2015-12-11 DIAGNOSIS — Z86711 Personal history of pulmonary embolism: Secondary | ICD-10-CM | POA: Diagnosis not present

## 2015-12-11 DIAGNOSIS — R51 Headache: Secondary | ICD-10-CM | POA: Insufficient documentation

## 2015-12-11 DIAGNOSIS — K859 Acute pancreatitis without necrosis or infection, unspecified: Secondary | ICD-10-CM | POA: Diagnosis not present

## 2015-12-11 DIAGNOSIS — Z9115 Patient's noncompliance with renal dialysis: Secondary | ICD-10-CM | POA: Insufficient documentation

## 2015-12-11 DIAGNOSIS — I69920 Aphasia following unspecified cerebrovascular disease: Secondary | ICD-10-CM | POA: Diagnosis present

## 2015-12-11 DIAGNOSIS — I69320 Aphasia following cerebral infarction: Secondary | ICD-10-CM

## 2015-12-11 DIAGNOSIS — E1122 Type 2 diabetes mellitus with diabetic chronic kidney disease: Secondary | ICD-10-CM | POA: Diagnosis not present

## 2015-12-11 DIAGNOSIS — Z8673 Personal history of transient ischemic attack (TIA), and cerebral infarction without residual deficits: Secondary | ICD-10-CM | POA: Insufficient documentation

## 2015-12-11 DIAGNOSIS — D5740 Sickle-cell thalassemia without crisis: Secondary | ICD-10-CM | POA: Insufficient documentation

## 2015-12-11 MED FILL — traMADol HCL 50 MG TABS: 50 | 7 days supply | Qty: 30 | Fill #0

## 2015-12-11 NOTE — Care Management Note (Signed)
Case Management Note  Patient Details  Name: Isaiah [name] MRN: [id number] Date of Birth: 06-01-1966  Subjective/Objective:                 Patient to dc to home. Verified patient active with [facility],    Action/Plan:  [place] notified to resumes services upon discharge 12-10-15 Expected Discharge Date:                  Expected Discharge Plan:  Home/Self Care  In-House Referral:     Discharge planning Services  CM Consult  Post Acute Care Choice:    Choice offered to:     DME Arranged:    DME Agency:     HH Arranged:  PT, OT, Speech Therapy [place] Agency:  [facility]  Status of Service:  Completed, signed off  Medicare Important Message Given:    Date Medicare IM Given:    Medicare IM give by:    Date Additional Medicare IM Given:    Additional Medicare Important Message give by:     If discussed at [place] of Stay Meetings, dates discussed:    Additional Comments:  Carles Collet, RN 12/11/2015, 7:57 AM

## 2015-12-11 NOTE — Progress Notes (Signed)
Subjective:    Patient ID: [place], male    DOB: [birth year], 50 y.o.   MRN: [id number] 50 year old RH-male with history of DM type 2 with diabetic, Sickle cell-thalassemia, B-PE 11/12, OSA, CKD stage V pending dialysis, anemia who was admitted to [facility] in [place] on 09/16/15 with N/V and blood stools with acute on chronic renal failure due to acute pancreatitis. He was treated with supportive care, IV bicarb as well as multiple units PRBC and EGD with severe stage IV erosive esophagitis with gastritis.  Noted to have abnormal LFTs and Hep A, Hep B and Hep C panels negative. HD initiated on 01/09 due to poor recover with decrease in UOP and currently ongoing on MWF.  He developed hypoxic respiratory failure requiring BIPAP? and was found to have RUL/RML PE as well as left popliteal DVT. He was also found to have left retrocardiac PNA and treated with IV antibiotics as well as IV heparin.   Elevated troponin's felt to be due to demand ischemia due to PE and was treated with BB and ASA per cardiology input.  IVC filter was placed and post procedure was found to have right hand weakness.   CT head reportedly showed subacute to old strokes. Neurology was consulted for input and EMG showed evidence of profound neuropathy. He had worsening of symptoms with increase in right sided weakness, facial droop speech difficulty on 09/27/15. MRI on 01/21 showed  Scattered foci of cortical, cerebellar and deep gray matter infarcts with mild petechial hemorrhage and developing laminar necrosis.   On 01/22, he had worsening of aphasia and repeat MRI brain 1/22 showed new  Restricted diffusion in left temporal and larger more prominent area in posterior left frontal/ perirolandic area involving motor strip in addition to numerous scattered infarcts in bilateral cerebral and cerebellar hemispheres. MRA without significant stenosis. Echo with positive bubble study and TEE was   positive for PFO. Hypercoagulation studies reported to be negative. IV heparin was changed to Lovenox due to recurrent embolic strokes on heparin. Swallow evaluation with moderate oral and mild pharyngeal dysphagia and MBS without aspiration. He was started on puree diet with thin liquids Admit date: 10/12/2015 Discharge date: 11/01/2015 HPI Wife wanted to talk to me about several concerns. She concerns about shaking of his right hand. He states that when she grabs it it stops. In addition she had questions regarding his disability. He has some Market researcher. There was some forms that were previously filled out that needed clarification. This was in regards to his disability whether it's chronic or severe or moderate. At this point I estimate that he will not be only get back to work although at one year we'll have a better idea. Definitely he'll be out of work for one year From the time of his stroke. He is also having some problems with naming he is more difficulty with names and months rather than numbers  Patient denies any significant pain issues. He has some postoperative pain status post AV fistula placement. He was in the hospital about a week for this procedure and just was discharged yesterday. He is getting dialysis 3 times a week.  History of thalassemia and hypercoag state follows with hematology. Also follows with neurology. Dr. Leonie Man  Pain Inventory Average Pain 5 Pain Right Now 5 My pain is intermittent and aching  In the last 24 hours, has pain interfered with the following? General activity 4 Relation with others 4 Enjoyment  of life 4 What TIME of day is your pain at its worst? evening Sleep (in general) Good  Pain is worse with: unsure Pain improves with: heat/ice Relief from Meds: 5  Mobility use a cane ability to climb steps?  yes do you drive?  no  Function not employed: date last employed FMLA 09/16/15 I need assistance with the following:  feeding, dressing  and bathing  Neuro/Psych weakness tremor  Prior Studies Any changes since last visit?  no  Physicians involved in your care Any changes since last visit?  no   Family History  Problem Relation Age of Onset  . Hypertension Mother   . Diabetes Mother   . Sickle cell anemia Brother   . Diabetes type I Brother   . Kidney disease Brother    Social History   Social History  . Marital Status: Married    Spouse Name: sylvia  . Number of Children: 1  . Years of Education: college   Occupational History  . [place] History Main Topics  . Smoking status: Never Smoker   . Smokeless tobacco: Never Used  . Alcohol Use: No  . Drug Use: No  . Sexual Activity: Yes   Other Topics Concern  . None   Social History Narrative   ** Merged History Encounter **       Past Surgical History  Procedure Laterality Date  . Cholecystectomy    . Bone marrow biopsy    . Other surgical history      Retinal surgery  . Pars plana vitrectomy  02/17/2012    Procedure: PARS PLANA VITRECTOMY WITH 25 GAUGE;  Surgeon: Hayden Pedro, MD;  Location: [facility];  Service: Ophthalmology;  Laterality: Right;  . Cholecystectomy  1990's?  . Eye surgery Right   . Insertion of dialysis catheter Right 10/16/2015    Procedure: INSERTION OF PALINDROME DIALYSIS CATHETER ;  Surgeon: Elam Dutch, MD;  Location: [facility];  Service: Vascular;  Laterality: Right;  . Av fistula placement Right 12/05/2015    Procedure: INSERTION OF ARTERIOVENOUS (AV) GORE-TEX GRAFT ARM;  Surgeon: Elam Dutch, MD;  Location: [facility] OR;  Service: Vascular;  Laterality: Right;   Past Medical History  Diagnosis Date  . Diabetes mellitus without complication ([place])   . Sickle cell-thalassemia disease ([place])     a. Sickle cell trait.  . Pulmonary embolism ([place]) 07/2011    a. Tx with Coumadin for 6 months (unknown cause per patient).  . Hyperkalemia 07/2011  . Acute renal failure ([place]) 07/2011  . Monoclonal  gammopathy   . Sleep apnea     a. uses CPAP.  [name] Hypertension   . Gout   . Pulmonary embolism ([place]) 07/2011; 09/27/2014    a. Bilat PE 07/2011 - unclear cause, tx with 6 months Coumadin.;   . Monoclonal gammopathies   . Anemia   . Asthma   . OSA on CPAP   . History of recent blood transfusion 10/27/14    2 Units PRBC's  . ESRD (end stage renal disease) on dialysis [facility])     Opioid Risk Score:   Fall Risk Score:  `1  Depression screen PHQ 2/9  Depression screen [facility] 2/9 11/16/2015 03/22/2015  Decreased Interest 0 0  Down, Depressed, Hopeless 0 0  PHQ - 2 Score 0 0  Altered sleeping 0 -  Tired, decreased energy 0 -  Change in appetite 0 -  Feeling bad or failure about yourself  0 -  Trouble concentrating 0 -  Moving slowly or fidgety/restless 0 -  Suicidal thoughts 0 -  PHQ-9 Score 0 -     Review of Systems  All other systems reviewed and are negative.      Objective:   Physical Exam  Constitutional: He appears well-developed and well-nourished. No distress.  HENT:  Head: Normocephalic and atraumatic.  Eyes: Conjunctivae and EOM are normal. Pupils are equal, round, and reactive to light.  Neck: Normal range of motion.  Musculoskeletal:       Right shoulder: He exhibits decreased range of motion.       Right wrist: He exhibits swelling.       Right hand: He exhibits swelling. Decreased strength noted. He exhibits finger abduction, thumb/finger opposition and wrist extension trouble.  Mild edema right hand, nontender  Right upper extremity healing surgical site in the forearm area no evidence of erythema or drainage  Fine motor finger thumb opposition is reduced particularly at thumb to fifth digit.  No evidence of erythema in the right upper extremity. Motor strength 3 minus in the right deltoid, biceps, triceps, finger flexors and extensors  4 minus in the right hip flexor and extensor ankle dorsiflex plantar flexor  Left Upper extremity 5/5  Left lower  extremity 5/5  Neurological: He is alert. He displays tremor. No sensory deficit. Coordination abnormal.  Normal sensation to light touch right upper extremity  Expressive aphasia. Dysfluent. Difficulty with naming. Unable to List months of the year skipped over April and May  Mild tremor right upper extremity during finger to thumb opposition, weakness related  Skin: Skin is warm. He is not diaphoretic.  Psychiatric: His affect is blunt.  Nursing note and vitals reviewed.         Assessment & Plan:  1. Left MCA distribution infarct with right hemiparesis and aphasia. He has fine motor deficits as well. He is currently 2 months post stroke. He still may make some additional recovery in the right upper extremity as well as right lower extremity strength and coordination. I also feel that he will make some improvements with his speech however at this point I would doubt he can return to work. We'll need to make assessments on a regular basis.  Will transition from home health therapy to outpatient therapy this week. He will need PT OT and speech I will see him back in one month  Discussed my findings as well as my recommendations with patient and wife. They are in agreement.  Over half of the 25 min visit was spent counseling and coordinating care.

## 2015-12-11 NOTE — Patient Instructions (Signed)
As we discussed the timeframe of cognitive recovery is up to 12 mo after Stroke Physical timeframe is shorter, about 6 month

## 2015-12-12 NOTE — Discharge Summary (Signed)
[facility] Discharge Summary  Isaiah [name] 08-23-1966 50 y.o. male  [id number]  Admission Date: 12/03/2015  Discharge Date: 12/10/2015  Physician: Ruta Hinds, MD  Admission Diagnosis: End Stage Renal Disease N18.6  HPI:   This is a 50 y.o. male who was seen for consideration of placement of a long-term hemodialysis access. The patient was initially seen as a consult in the hospital at the request of Dr. Florene Glen. The patient had had a stroke at that point from a paradoxical embolus from DVT. He is currently on Coumadin. He is dialyzing currently by catheter. Stroke affected his right arm and he has had some return of function in this arm. He is also having improvement of his speech. His current dialysis today is Monday Wednesday Friday.  Hospital Course:  The patient was admitted to the hospital on 12/03/15 for bridging with IV heparin since he is high risk due to recent DVT and stroke. He was taken to the operating room on 12/05/2015 and underwent: right radial cephalic AV fistula.     The patient tolerated the procedure well and was transported to the PACU in stable condition.   The patient's post-operative course was largely uncomplicated. His fistula was patent and he exhibited no signs of steal syndrome. His incision was healing well. He was restarted on coumadin with a heparin bridge on POD 2. Nephrology assisted with dialysis needs. His catheter was not functioning on POD 5 for dialysis but resolved after TPA protocol. He successfully dialyzed afterwards.  He was discharged home on POD 5 when his INR was 1.9. An INR follow-up appointment with his PCP was set up for 12/13/15. Home health services were resumed given recent CVA.      CBC    Component Value Date/Time   WBC 14.4* 12/10/2015 0850   WBC 17.1* 09/06/2015 1306   RBC 3.24* 12/10/2015 0850   RBC 4.26 09/06/2015 1306   RBC 3.07* 09/29/2014 1810   HGB 9.0* 12/10/2015 0850   HGB 11.3* 09/06/2015  1306   HCT 25.2* 12/10/2015 0850   HCT 32.8* 09/06/2015 1306   PLT 409* 12/10/2015 0850   PLT 322 09/06/2015 1306   MCV 77.8* 12/10/2015 0850   MCV 77.0* 09/06/2015 1306   MCH 27.8 12/10/2015 0850   MCH 26.5* 09/06/2015 1306   MCHC 35.7 12/10/2015 0850   MCHC 34.5 09/06/2015 1306   RDW 16.8* 12/10/2015 0850   RDW 19.1* 09/06/2015 1306   LYMPHSABS 1.6 09/06/2015 1306   LYMPHSABS 4.5* 10/04/2012 1340   MONOABS 1.1* 09/06/2015 1306   MONOABS 1.7* 10/04/2012 1340   EOSABS 0.3 09/06/2015 1306   EOSABS 0.8* 10/04/2012 1340   BASOSABS 0.1 09/06/2015 1306   BASOSABS 0.2* 10/04/2012 1340    BMET    Component Value Date/Time   NA 132* 12/10/2015 0850   NA 138 09/06/2015 1306   K 4.1 12/10/2015 0850   K 4.8 09/06/2015 1306   CL 99* 12/10/2015 0850   CO2 22 12/10/2015 0850   CO2 15* 09/06/2015 1306   GLUCOSE 112* 12/10/2015 0850   GLUCOSE 98 09/06/2015 1306   BUN 38* 12/10/2015 0850   BUN 54.0* 09/06/2015 1306   CREATININE 7.43* 12/10/2015 0850   CREATININE 5.8* 09/06/2015 1306   CALCIUM 8.9 12/10/2015 0850   CALCIUM 7.6* 09/06/2015 1306   GFRNONAA 7* 12/10/2015 0850   GFRAA 9* 12/10/2015 0850     Discharge Instructions:   The patient is discharged to home with extensive instructions on wound care  and progressive ambulation.  They are instructed not to drive or perform any heavy lifting until returning to see the physician in his office.  Discharge Instructions    Call MD for:  redness, tenderness, or signs of infection (pain, swelling, bleeding, redness, odor or green/yellow discharge around incision site)    Complete by:  As directed      Call MD for:  severe or increased pain, loss or decreased feeling  in affected limb(s)    Complete by:  As directed      Call MD for:  temperature >100.5    Complete by:  As directed      Discharge wound care:    Complete by:  As directed   Wash wound daily with soap and water and pat dry.     Driving Restrictions    Complete by:  As  directed   No driving until cleared by PCP due to recent stroke.     Increase activity slowly    Complete by:  As directed   Walk with assistance use walker or cane as needed     Lifting restrictions    Complete by:  As directed   No lifting for 1 week     Resume previous diet    Complete by:  As directed            Discharge Diagnosis:  End Stage Renal Disease N18.6  Secondary Diagnosis: Patient Active Problem List   Diagnosis Date Noted  . ESRD (end stage renal disease) ([place]) 12/03/2015  . PFO (patent foramen ovale) 11/06/2015  . CVA (cerebral infarction)   . ESRD (end stage renal disease) on dialysis ([place])   . S/P dialysis catheter insertion ([place])   . Acute deep vein thrombosis (DVT) of popliteal vein of left lower extremity ([place])   . Leukocytosis   . Labile blood glucose   . Acute blood loss anemia   . Hemiparesis, aphasia, and dysphagia as late effects of cerebrovascular accident ([place]) 10/13/2015  . Gait disturbance, post-stroke 10/13/2015  . Embolic stroke ([place]) 14/48/1856  . Plasma cell disorder   . Anemia 10/31/2014  . Long term current use of anticoagulant therapy 10/31/2014  . Renal insufficiency 10/31/2014  . Iron deficiency anemia due to chronic blood loss 10/30/2014  . Encounter for therapeutic drug monitoring 10/11/2014  . Pulmonary embolus ([place]) 09/27/2014  . ESRD on dialysis ([place]) 09/27/2014  . MGUS (monoclonal gammopathy of unknown significance) 09/27/2014  . OSA on CPAP 09/27/2014  . Diabetic retinopathy of both eyes ([place]) 02/03/2012  . Sickle cell trait ([place]) 07/19/2011  . Morbid obesity ([place]) 07/19/2011  . PE (pulmonary embolism) 07/18/2011  . SOB (shortness of breath) 07/18/2011  . Hyperkalemia 07/18/2011  . ARF (acute renal failure) ([place]) 07/18/2011  . Leukocytosis, unspecified 07/18/2011  . DM (diabetes mellitus) type II controlled with renal manifestation ([place]) 07/18/2011  . Cardiac enzymes elevated 07/18/2011  . Metabolic acidosis  31/49/7026  . HTN (hypertension) 07/18/2011   Past Medical History  Diagnosis Date  . Diabetes mellitus without complication ([place])   . Sickle cell-thalassemia disease ([place])     a. Sickle cell trait.  . Pulmonary embolism ([place]) 07/2011    a. Tx with Coumadin for 6 months (unknown cause per patient).  . Hyperkalemia 07/2011  . Acute renal failure ([place]) 07/2011  . Monoclonal gammopathy   . Sleep apnea     a. uses CPAP.  [name] Hypertension   . Gout   . Pulmonary embolism ([place]) 07/2011;  09/27/2014    a. Bilat PE 07/2011 - unclear cause, tx with 6 months Coumadin.;   . Monoclonal gammopathies   . Anemia   . Asthma   . OSA on CPAP   . History of recent blood transfusion 10/27/14    2 Units PRBC's  . ESRD (end stage renal disease) on dialysis [facility])        Medication List    TAKE these medications        aspirin EC 81 MG tablet  Take 1 tablet (81 mg total) by mouth daily.     cetirizine 10 MG tablet  Commonly known as:  ZYRTEC  Take 10 mg by mouth as needed for allergies.     epoetin alfa 10000 UNIT/ML injection  Commonly known as:  EPOGEN,PROCRIT  Inject 5,000 Units into the skin once a week.     fluticasone furoate-vilanterol 100-25 MCG/INH Aepb  Commonly known as:  BREO ELLIPTA  Inhale 1 puff into the lungs daily.     Insulin Glargine 100 UNIT/ML Solostar Pen  Commonly known as:  LANTUS  Inject 25 Units into the skin daily at 10 pm.     midodrine 2.5 MG tablet  Commonly known as:  PROAMATINE  Take 1 tablet (2.5 mg total) by mouth 2 (two) times daily with a meal.     multivitamin Tabs tablet  Take 1 tablet by mouth at bedtime.     NEEDLE (DISP) 27 G 27G X 1/2" Misc  Commonly known as:  BD DISP NEEDLES  1 application by Does not apply route daily after supper.     pantoprazole 40 MG tablet  Commonly known as:  PROTONIX  Take 1 tablet (40 mg total) by mouth 2 (two) times daily.     traMADol 50 MG tablet  Commonly known as:  ULTRAM  Take 1 tablet (50 mg total) by  mouth every 6 (six) hours as needed for moderate pain.     warfarin 5 MG tablet  Commonly known as:  COUMADIN  Take 1.5 tablets (7.5 mg total) by mouth daily at 6 PM.        Tramadol #30 No Refill  Disposition: Home  Patient's condition: is Good  Follow up: 1. Dr. Oneida Alar in 4 weeks 2. Dr. Carlis Abbott for INR check   Virgina Jock, PA-C [facility] [phone] 12/12/2015  9:19 AM

## 2015-12-13 ENCOUNTER — Telehealth: Payer: Self-pay | Admitting: Cardiovascular Disease

## 2015-12-13 NOTE — Telephone Encounter (Signed)
New message      Calling to talk to the nurse.  She would not tell me what she wanted

## 2015-12-13 NOTE — Telephone Encounter (Signed)
Called spoke with pt's wife, Pt has been having INR's HD [phone] managed by primary MD. She wishes for pt to establish in our Coumadin Clinic and have INR's managed and monitored here.  She is aware pt will have to come into clinic to have blood tested and dosed.  Made appt in clinic for 12/21/15 at 10:30 am.

## 2015-12-13 NOTE — Telephone Encounter (Signed)
Spoke with patient's wife who requests patient come to our coumadin clinic.  She states he is currently getting his INR checked at dialysis.  She states she would feel more comfortable with our office following him; would like more consistency.  She states yesterday's reading was 2.2 at dialysis.  She states he goes to dialysis on Monday, Wednesdays, and Fridays at noon.  I advised her that I will send message to our [facility] staff and that someone from our office will call her back to set up an appointment.  She verbalized understanding and agreement.

## 2015-12-14 ENCOUNTER — Ambulatory Visit: Payer: BLUE CROSS/BLUE SHIELD | Attending: Physical Medicine & Rehabilitation

## 2015-12-14 ENCOUNTER — Ambulatory Visit: Payer: Self-pay

## 2015-12-14 ENCOUNTER — Ambulatory Visit: Payer: Self-pay | Admitting: Physical Medicine & Rehabilitation

## 2015-12-14 ENCOUNTER — Ambulatory Visit: Payer: BLUE CROSS/BLUE SHIELD | Admitting: Occupational Therapy

## 2015-12-14 ENCOUNTER — Ambulatory Visit: Payer: BLUE CROSS/BLUE SHIELD

## 2015-12-14 DIAGNOSIS — G8191 Hemiplegia, unspecified affecting right dominant side: Secondary | ICD-10-CM | POA: Insufficient documentation

## 2015-12-14 DIAGNOSIS — I69351 Hemiplegia and hemiparesis following cerebral infarction affecting right dominant side: Secondary | ICD-10-CM

## 2015-12-14 DIAGNOSIS — I69315 Cognitive social or emotional deficit following cerebral infarction: Secondary | ICD-10-CM | POA: Insufficient documentation

## 2015-12-14 DIAGNOSIS — R482 Apraxia: Secondary | ICD-10-CM | POA: Diagnosis not present

## 2015-12-14 DIAGNOSIS — R471 Dysarthria and anarthria: Secondary | ICD-10-CM | POA: Diagnosis not present

## 2015-12-14 DIAGNOSIS — R2689 Other abnormalities of gait and mobility: Secondary | ICD-10-CM

## 2015-12-14 DIAGNOSIS — R278 Other lack of coordination: Secondary | ICD-10-CM | POA: Diagnosis not present

## 2015-12-14 DIAGNOSIS — R41841 Cognitive communication deficit: Secondary | ICD-10-CM | POA: Diagnosis not present

## 2015-12-14 DIAGNOSIS — R279 Unspecified lack of coordination: Secondary | ICD-10-CM | POA: Diagnosis not present

## 2015-12-14 NOTE — Therapy (Signed)
[facility] [address] [place], [place], [address] Phone: [phone]   Fax:  [phone]  Occupational Therapy Evaluation  Patient Details  Name: Isaiah [name] [name] MRN: [id number] Date of Birth: 1966/03/27 Referring Provider: Letta Pate  Encounter Date: 12/14/2015      OT End of Session - 12/14/15 1119    Visit Number 1   Number of Visits 17   Date for OT Re-Evaluation 02/11/16   Authorization Type [employer]   Authorization Time Period await clarification if PT/OT is comb 20   Authorization - Visit Number 1   Authorization - Number of Visits 10   OT Start Time 1020   OT Stop Time 1100   OT Time Calculation (min) 40 min   Activity Tolerance Patient tolerated treatment well   Behavior During Therapy [facility] for tasks assessed/performed      Past Medical History  Diagnosis Date  . Diabetes mellitus without complication ([place])   . Sickle cell-thalassemia disease ([place])     a. Sickle cell trait.  . Pulmonary embolism ([place]) 07/2011    a. Tx with Coumadin for 6 months (unknown cause per patient).  . Hyperkalemia 07/2011  . Acute renal failure ([place]) 07/2011  . Monoclonal gammopathy   . Sleep apnea     a. uses CPAP.  [name] Hypertension   . Gout   . Pulmonary embolism ([place]) 07/2011; 09/27/2014    a. Bilat PE 07/2011 - unclear cause, tx with 6 months Coumadin.;   . Monoclonal gammopathies   . Anemia   . Asthma   . OSA on CPAP   . History of recent blood transfusion 10/27/14    2 Units PRBC's  . ESRD (end stage renal disease) on dialysis [name])     Past Surgical History  Procedure Laterality Date  . Cholecystectomy    . Bone marrow biopsy    . Other surgical history      Retinal surgery  . Pars plana vitrectomy  02/17/2012    Procedure: PARS PLANA VITRECTOMY WITH 25 GAUGE;  Surgeon: Hayden Pedro, MD;  Location: [place];  Service: Ophthalmology;  Laterality: Right;  . Cholecystectomy  1990's?  . Eye surgery Right   .  Insertion of dialysis catheter Right 10/16/2015    Procedure: INSERTION OF PALINDROME DIALYSIS CATHETER ;  Surgeon: Elam Dutch, MD;  Location: [place];  Service: Vascular;  Laterality: Right;  . Av fistula placement Right 12/05/2015    Procedure: INSERTION OF ARTERIOVENOUS (AV) GORE-TEX GRAFT ARM;  Surgeon: Elam Dutch, MD;  Location: [place];  Service: Vascular;  Laterality: Right;    There were no vitals filed for this visit.      Subjective Assessment - 12/14/15 1027    Subjective  Pt reports pain in right hand at times at night   Pertinent History see Epic    Patient Stated Goals to use right hand better   Currently in Pain? No/denies           [facility] OT Assessment - 12/14/15 0001    Assessment   Diagnosis L MCA CVA   Referring Provider Kirsteins   Onset Date 09/27/15   Assessment Pt. with multiple medical issues sustained a left MCA CVA with resultant right hemiparesis.   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   Leisure watching students play sports   ADL   Eating/Feeding Modified independent  uses RUE 20%, then left hand for thte rest   Grooming Modified independent  uses primarily left hand   Upper Body Bathing Minimal assistance   Lower Body Bathing Minimal assistance  has tub seat   Upper Body Dressing Minimal assistance   Lower Body Dressing Minimal assistance   Toilet Tranfer Modified independent   Tub/Shower Transfer Supervision/safety   ADL comments Pt's wife assists PRN   IADL    Meal Prep Able to complete simple cold meal and snack prep  per pt wife report   Mobility   Mobility Status --  modified independent with cane   Written Expression   Dominant Hand Right   Handwriting Not legible   Vision - History   Baseline Vision Wears glasses only for reading   Visual History Other (comment)   Patient Visual Report --  vision is not clear   Vision Assessment   Vision Assessment Vision impaired  _ to be further tested in functional context   Cognition   Overall Cognitive Status Cognition to be further assessed in functional context PRN  Pt demonstrates significant aphasia   Memory --   Behaviors Other (comment)  Decreased awareness of balance deficits   Sensation   Light Touch Impaired by gross assessment  for right hand   Coordination   Gross Motor Movements are Fluid and Coordinated No   Fine Motor Movements are Fluid and Coordinated No   9 Hole Peg Test Right   Right 9 Hole Peg Test unable due to impired coordination   Box and Blocks RUE 8 blocks, LUE 39 blocks   Tremors occasional tremor in RUE   Coordination impaired for RUE   Edema   Edema mild in right hand   ROM / Strength   AROM / PROM / Strength AROM   AROM   Right/Left Shoulder Right   Right Shoulder Flexion 105 Degrees   Right Shoulder ABduction 90 Degrees   Right/Left Elbow Right   Right Elbow Flexion --  WNLS   Right Elbow Extension --  WNLS   Right/Left Forearm Right   Right Forearm Pronation 75 Degrees   Right Forearm Supination 75 Degrees   Right/Left Wrist Right   Right Wrist Extension 25 Degrees   Right Wrist Flexion 65 Degrees   Right/Left Finger Right   Right Composite Finger Extension --  90%   Right Composite Finger Flexion --  70%   Strength   Overall Strength Deficits   Right Hand AROM   R Thumb Opposition to Index --  opposes digits 2,3 unable for 4, 5   Hand Function   Right Hand Grip (lbs) --  unable, does not register on dynamometer   Left Hand Grip (lbs)  70 lbs                         OT Education - 12/14/15 1620    Education provided Yes   Education Details OT discussed frequency / duration and OT plan, therapist recommended pt does not use pulleys or weight with RUE due  to risk for injury, therapist incouraged A/ROM finger flexion/ extension to decrease edema   Person(s) Educated Patient;Spouse   Methods Explanation   Comprehension Verbalized understanding          OT Short Term Goals - 12/14/15 1622    OT SHORT TERM GOAL #1   Title I with initiall HEP   Time 4   Period Weeks   Status New   OT SHORT TERM GOAL #2   Title Pt will perform all basic ADLS with supervision.   Time 4   Period Weeks   Status New   OT SHORT TERM GOAL #3   Title Pt will demonstrate improved RUE functional use as evidenced by performing 15 blocks on box/ blocks.   Time 4   Period Weeks   Status New   OT SHORT TERM GOAL #4   Title Pt will demonstrate ability to retrieve a light weight object at 115 shoulder flexion with RUE.   Time 4   Period Weeks   Status New   OT SHORT TERM GOAL #5   Title Further assess vision and set goal PRN.   Time 4   Period Weeks   Status New   Additional Short Term Goals   Additional Short Term Goals Yes   OT SHORT TERM GOAL #6   Title Pt will report feeding himself with RUE 50% of the time.   Time 4   Period Weeks   Status New           OT Long Term Goals - 12/14/15 1624    OT LONG TERM GOAL #1   Title I with updated HEP.   Time 8   Period Weeks   Status New   OT LONG TERM GOAL #2   Title Pt will demonstrate ability to write his name legibily.   Time 8   Period Weeks   Status New   OT LONG TERM GOAL #3   Title Pt will use RUE as an active assist for ADLS at least 63% of the time with pain no greater than 3/10.   Time 8   Period Weeks   Status New   OT LONG TERM GOAL #4   Title Pt will demonstrate improved finge motor coordination as evidenced by placing 4 pegs in 9 hole peg test  board in 2 mins or less.   Time 8   Period Weeks   Status New   OT LONG TERM GOAL #5   Title Pt will demonstrate RUE grip strength of 15 lbs for increased functional use.   Time 8   Period Weeks   Status New   Long Term Additional Goals   Additional Long Term Goals Yes   OT LONG TERM GOAL #6   Title Pt will perform all basic ADLs and simple meal prep modified independently.   Time 8   Period Weeks   Status New               Plan - 12/14/15 1559    Clinical Impression Statement Pt with multiple medical issues including DM, sickle cell anemia, CKD on dialyis sustained L MCA distribution infarct on 09/27/15. Pt can benfir from skilled occupational therapy to address: hemiplegia, decreased coordination, decreased balance / functional mobility ,cognition and visuospatial deficits PRN.[name]   Rehab Potential Good   OT Frequency 2x / week  plus eval   OT Duration 8 weeks   OT Treatment/Interventions Self-care/ADL training;Therapeutic exercise;Patient/family education;Balance training;Splinting;Manual Therapy;Neuromuscular education;Ultrasound;Therapeutic exercises;Therapeutic activities;DME and/or  AE instruction;Parrafin;Cryotherapy;Electrical Stimulation;Fluidtherapy;Scar mobilization;Cognitive remediation/compensation;Moist Heat;Contrast Bath;Passive range of motion;Visual/perceptual remediation/compensation   Plan initate HEP   Consulted and Agree with Plan of Care Patient      Patient will benefit from skilled therapeutic intervention in order to improve the following deficits and impairments:   decreased coordination, decreased balance and mobility, cognitive deficits, right hemiplegia.   Visit Diagnosis: Other lack of coordination  Other abnormalities of gait and mobility  Cognitive social or emotional deficit following cerebral infarction  Hemiplegia and hemiparesis following cerebral infarction affecting right dominant side [facility])    Problem List Patient Active Problem  List   Diagnosis Date Noted  . ESRD (end stage renal disease) ([place]) 12/03/2015  . PFO (patent foramen ovale) 11/06/2015  . CVA (cerebral infarction)   . ESRD (end stage renal disease) on dialysis ([place])   . S/P dialysis catheter insertion ([place])   . Acute deep vein thrombosis (DVT) of popliteal vein of left lower extremity ([place])   . Leukocytosis   . Labile blood glucose   . Acute blood loss anemia   . Hemiparesis, aphasia, and dysphagia as late effects of cerebrovascular accident ([place]) 10/13/2015  . Gait disturbance, post-stroke 10/13/2015  . Embolic stroke ([place]) 35/45/6256  . Plasma cell disorder   . Anemia 10/31/2014  . Long term current use of anticoagulant therapy 10/31/2014  . Renal insufficiency 10/31/2014  . Iron deficiency anemia due to chronic blood loss 10/30/2014  . Encounter for therapeutic drug monitoring 10/11/2014  . Pulmonary embolus ([place]) 09/27/2014  . ESRD on dialysis ([place]) 09/27/2014  . MGUS (monoclonal gammopathy of unknown significance) 09/27/2014  . OSA on CPAP 09/27/2014  . Diabetic retinopathy of both eyes ([place]) 02/03/2012  . Sickle cell trait ([place]) 07/19/2011  . Morbid obesity ([place]) 07/19/2011  . PE (pulmonary embolism) 07/18/2011  . SOB (shortness of breath) 07/18/2011  . Hyperkalemia 07/18/2011  . ARF (acute renal failure) ([place]) 07/18/2011  . Leukocytosis, unspecified 07/18/2011  . DM (diabetes mellitus) type II controlled with renal manifestation ([place]) 07/18/2011  . Cardiac enzymes elevated 07/18/2011  . Metabolic acidosis 38/93/7342  . HTN (hypertension) 07/18/2011    Tiye Huwe 12/14/2015, 4:33 PM Theone Murdoch, OTR/L [phone] Phone: [phone] 4:33 PM 12/14/2015 [facility] [address] [place], [place], [address] Phone: [phone]   Fax:  [phone]  Name: Isaiah [name] [facility] MRN: [id number] Date of Birth: [birth year]

## 2015-12-14 NOTE — Patient Instructions (Signed)
You will need to slow down your talking. This improves your accuracy with your speech dramatically.

## 2015-12-14 NOTE — Therapy (Signed)
[facility] [address] [place], [place], [address] Phone: [phone]   Fax:  [phone]  Speech Language Pathology Evaluation  Patient Details  Name: Isaiah [name] [facility] MRN: [id number] Date of Birth: [birth year] Referring Provider: Alysia Penna, MD  Encounter Date: 12/14/2015      End of Session - 12/14/15 1628    Visit Number 1   Number of Visits 15   Date for SLP Re-Evaluation 02/13/16   Authorization Type BCBS auth requested   SLP Start Time [id number]   SLP Stop Time  1015   SLP Time Calculation (min) 42 min   Activity Tolerance Patient tolerated treatment well      Past Medical History  Diagnosis Date  . Diabetes mellitus without complication ([place])   . Sickle cell-thalassemia disease ([place])     a. Sickle cell trait.  . Pulmonary embolism ([place]) 07/2011    a. Tx with Coumadin for 6 months (unknown cause per patient).  . Hyperkalemia 07/2011  . Acute renal failure ([place]) 07/2011  . Monoclonal gammopathy   . Sleep apnea     a. uses CPAP.  [name] Hypertension   . Gout   . Pulmonary embolism ([place]) 07/2011; 09/27/2014    a. Bilat PE 07/2011 - unclear cause, tx with 6 months Coumadin.;   . Monoclonal gammopathies   . Anemia   . Asthma   . OSA on CPAP   . History of recent blood transfusion 10/27/14    2 Units PRBC's  . ESRD (end stage renal disease) on dialysis [facility])     Past Surgical History  Procedure Laterality Date  . Cholecystectomy    . Bone marrow biopsy    . Other surgical history      Retinal surgery  . Pars plana vitrectomy  02/17/2012    Procedure: PARS PLANA VITRECTOMY WITH 25 GAUGE;  Surgeon: Hayden Pedro, MD;  Location: [place];  Service: Ophthalmology;  Laterality: Right;  . Cholecystectomy  1990's?  . Eye surgery Right   . Insertion of dialysis catheter Right 10/16/2015    Procedure: INSERTION OF PALINDROME DIALYSIS CATHETER ;  Surgeon: Elam Dutch, MD;  Location: [facility];  Service: Vascular;   Laterality: Right;  . Av fistula placement Right 12/05/2015    Procedure: INSERTION OF ARTERIOVENOUS (AV) GORE-TEX GRAFT ARM;  Surgeon: Elam Dutch, MD;  Location: [facility];  Service: Vascular;  Laterality: Right;    There were no vitals filed for this visit.      Subjective Assessment - 12/14/15 0944    Subjective "My memory and - - cognition, - -sequencing."   Patient is accompained by: Family member  [name], wife            SLP Evaluation [facility] - 12/14/15 0949    SLP Visit Information   SLP Received On 12/14/15   Referring Provider Alysia Penna, MD   Onset Date January 2017   Medical Diagnosis CVA   Pain Assessment   Pain Score 0-No pain   General Information   HPI Pt with CVA in January. Language, cognition, mild dysfluency, dysarthria all affected. Pt reports language skills have resolved, mostly. Pt rec'd HHST for approx 4 weeks.    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   Cognition   Overall Cognitive Status Difficult to assess  due to  time constratints   Verbal Expression   Overall Verbal Expression Appears within functional limits for tasks assessed   Oral Motor/Sensory Function   Labial ROM Reduced right   Labial Symmetry Abnormal symmetry right   Labial Strength Reduced Right   Labial Coordination Reduced   Lingual ROM Reduced right   Lingual Symmetry Abnormal symmetry right   Lingual Strength Reduced Right   Facial Symmetry Right droop   Motor Speech   Overall Motor Speech Impaired   Respiration Within functional limits   Phonation Normal   Resonance Within functional limits   Articulation Impaired   Level of Impairment Sentence   Intelligibility Intelligibility reduced   Conversation 75-100% accurate  request for repeat (wife) approx 3 times a day   Motor Planning Impaired   Level of Impairment  Sentence   Motor Speech Errors Aware   Effective Techniques Slow rate                         SLP Education - 12/14/15 1628    Education provided Yes   Education Details need to reduce rate of speech, ST plan and course of tx   Person(s) Educated Patient;Spouse   Methods Explanation;Demonstration   Comprehension Verbalized understanding;Returned demonstration;Verbal cues required          SLP Short Term Goals - 12/14/15 1632    SLP SHORT TERM GOAL #1   Title pt will demo HEP for apraxia/motor plannning with independence over 3 sessions   Time 4   Period Weeks  or 8 visits, for all STGs   Status New   SLP SHORT TERM GOAL #2   Title pt will demo HEP for dysarthria with independence over 3 sessions   Time 4   Period Weeks   Status New   SLP SHORT TERM GOAL #3   Title pt will demo speech 95% accurate in sentence-length responses with modified independence (compensations) over 3 sessions   Time 4   Period Weeks   Status New          SLP Long Term Goals - 12/14/15 1635    SLP LONG TERM GOAL #1   Title pt will demo functional speech in 20 minutes mod complex/complex conversation with modified independnence   Time 8   Period Weeks  or 15 visits, for all LTGs   Status New   SLP LONG TERM GOAL #2   Title pt will demo HEP for speech-motor planning indpendently over 5 total sessions    Time 8   Period Weeks   Status New   SLP LONG TERM GOAL #3   Title pt will demo HEP for dysarthria independently over 5 total sessions   Time 8   Period Weeks   Status New          Plan - 12/14/15 1629    Clinical Impression Statement Pt presents today with motor speech deficit manifesting itself as apraxia of speech, deteriorating pt's conversational speech. 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 Pt is motivated for change.    Speech Therapy Frequency 2x / week    Duration --  8 weeks, or 15 sessions   Treatment/Interventions SLP instruction and feedback;Compensatory strategies;Internal/external aids;Oral motor exercises;Patient/family education;Functional tasks;Cueing hierarchy   Potential to Achieve Goals Good   Potential Considerations Severity of impairments   Consulted and Agree with Plan  of Care Patient      Patient will benefit from skilled therapeutic intervention in order to improve the following deficits and impairments:   Apraxia  Dysarthria and anarthria    Problem List Patient Active Problem List   Diagnosis Date Noted  . ESRD (end stage renal disease) ([place]) 12/03/2015  . PFO (patent foramen ovale) 11/06/2015  . CVA (cerebral infarction)   . ESRD (end stage renal disease) on dialysis ([place])   . S/P dialysis catheter insertion ([place])   . Acute deep vein thrombosis (DVT) of popliteal vein of left lower extremity ([place])   . Leukocytosis   . Labile blood glucose   . Acute blood loss anemia   . Hemiparesis, aphasia, and dysphagia as late effects of cerebrovascular accident ([place]) 10/13/2015  . Gait disturbance, post-stroke 10/13/2015  . Embolic stroke ([place]) 40/98/1191  . Plasma cell disorder   . Anemia 10/31/2014  . Long term current use of anticoagulant therapy 10/31/2014  . Renal insufficiency 10/31/2014  . Iron deficiency anemia due to chronic blood loss 10/30/2014  . Encounter for therapeutic drug monitoring 10/11/2014  . Pulmonary embolus ([place]) 09/27/2014  . ESRD on dialysis ([place]) 09/27/2014  . MGUS (monoclonal gammopathy of unknown significance) 09/27/2014  . OSA on CPAP 09/27/2014  . Diabetic retinopathy of both eyes ([place]) 02/03/2012  . Sickle cell trait ([place]) 07/19/2011  . Morbid obesity ([place]) 07/19/2011  . PE (pulmonary embolism) 07/18/2011  . SOB (shortness of breath) 07/18/2011  . Hyperkalemia 07/18/2011  . ARF (acute renal failure) ([place]) 07/18/2011  . Leukocytosis, unspecified 07/18/2011  . DM (diabetes  mellitus) type II controlled with renal manifestation ([place]) 07/18/2011  . Cardiac enzymes elevated 07/18/2011  . Metabolic acidosis 47/82/9562  . HTN (hypertension) 07/18/2011    [facility] ,[place], [place]  12/14/2015, 4:39 PM  [facility] [address] [place], [place], [address] Phone: [phone]   Fax:  [phone]  Name: Cary Lothrop [facility] MRN: [id number] Date of Birth: Apr 19, 1966

## 2015-12-14 NOTE — Therapy (Signed)
[facility] [address] [place], [place], [address] Phone: [phone]   Fax:  [phone]  Physical Therapy Evaluation  Patient Details  Name: Isaiah [name] [name] MRN: [id number] Date of Birth: 1965-09-11 Referring Provider: Dr. Letta Pate  Encounter Date: 12/14/2015      PT End of Session - 12/14/15 1208    Visit Number 1   Number of Visits 17   Date for PT Re-Evaluation 02/12/16   Authorization Type BCBS: pt may be limited to 20 OT/PT visits combined-checking with Lattie Haw   PT Start Time 1104   PT Stop Time 1142   PT Time Calculation (min) 38 min   Equipment Utilized During Treatment Gait belt   Activity Tolerance Patient tolerated treatment well   Behavior During Therapy [facility] for tasks assessed/performed      Past Medical History  Diagnosis Date  . Diabetes mellitus without complication ([place])   . Sickle cell-thalassemia disease ([place])     a. Sickle cell trait.  . Pulmonary embolism ([place]) 07/2011    a. Tx with Coumadin for 6 months (unknown cause per patient).  . Hyperkalemia 07/2011  . Acute renal failure ([place]) 07/2011  . Monoclonal gammopathy   . Sleep apnea     a. uses CPAP.  [name] Hypertension   . Gout   . Pulmonary embolism ([place]) 07/2011; 09/27/2014    a. Bilat PE 07/2011 - unclear cause, tx with 6 months Coumadin.;   . Monoclonal gammopathies   . Anemia   . Asthma   . OSA on CPAP   . History of recent blood transfusion 10/27/14    2 Units PRBC's  . ESRD (end stage renal disease) on dialysis [facility])     Past Surgical History  Procedure Laterality Date  . Cholecystectomy    . Bone marrow biopsy    . Other surgical history      Retinal surgery  . Pars plana vitrectomy  02/17/2012    Procedure: PARS PLANA VITRECTOMY WITH 25 GAUGE;  Surgeon: Hayden Pedro, MD;  Location: [facility];  Service: Ophthalmology;  Laterality: Right;  . Cholecystectomy  1990's?  . Eye surgery Right   . Insertion of dialysis catheter  Right 10/16/2015    Procedure: INSERTION OF PALINDROME DIALYSIS CATHETER ;  Surgeon: Elam Dutch, MD;  Location: [facility];  Service: Vascular;  Laterality: Right;  . Av fistula placement Right 12/05/2015    Procedure: INSERTION OF ARTERIOVENOUS (AV) GORE-TEX GRAFT ARM;  Surgeon: Elam Dutch, MD;  Location: [facility];  Service: Vascular;  Laterality: Right;    There were no vitals filed for this visit.       Subjective Assessment - 12/14/15 1109    Subjective Pt had a CVA in 09/2015 and was hospitalized for 2 months. 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 While in the hospital he had a DVT and PE, and found to have infarcts in cerebral and cerebellar portions of brain. Pt reports he only uses SPC in the community but not at home. Pt states he has issues holding handrail at home due to weak R grip strength. Pt feels his balance is not impaired.   Pertinent History DVT, PE-has IVC filter in placed, DM, CKD-stage V on dialysis, sickle cell anemia, OSA, asthma, HTN, tachycardia in hospital (12/06/15), AVF    Patient Stated Goals Climb stairs safely   Currently in Pain? No/denies            [facility]  PT Assessment - 12/14/15 1114    Assessment   Medical Diagnosis Spastic hemiplegia    Referring Provider Dr. Letta Pate   Onset Date/Surgical Date 09/30/15   Hand Dominance Right   Prior Therapy Inpt rehab and HHPT   Precautions   Precautions Fall   Precaution Comments based on BERG score   Restrictions   Weight Bearing Restrictions No   Balance Screen   Has the patient fallen in the past 6 months No   Has the patient had a decrease in activity level because of a fear of falling?  No   Is the patient reluctant to leave their home because of a fear of falling?  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   Leisure watch his students play sports   Cognition   Overall Cognitive Status Impaired/Different from baseline  decr. safety awareness   Memory Impaired   Observation/Other Assessments   Focus on Therapeutic Outcomes (FOTO)  Neuro QOL LE: 34.3   Sensation   Light Touch Appears Intact   Additional Comments Intermittent R hand N/T   Coordination   Gross Motor Movements are Fluid and Coordinated No   Fine Motor Movements are Fluid and Coordinated No   Heel Shin Test R LE slower speed   Posture/Postural Control   Posture/Postural Control Postural limitations   Postural Limitations Forward head   Tone   Assessment Location Other (comment)  no c/o tone, not formally assessed   ROM / Strength   AROM / PROM / Strength AROM;Strength   AROM   Overall AROM  Within functional limits for tasks performed   Overall AROM Comments BLE AROM WFL   Strength   Overall Strength Deficits   Overall Strength Comments LLE globally: 5/5. RLE 4+/5 to 5/5 except 4/5 hip abd and hamstring   Transfers   Transfers Sit to Stand;Stand to Sit   Sit to Stand 5: Supervision;With upper extremity assist;From chair/3-in-1   Stand to Sit 5: Supervision;With upper extremity assist;To chair/3-in-1   Ambulation/Gait   Ambulation/Gait Yes   Ambulation/Gait Assistance 5: Supervision   Ambulation/Gait Assistance Details No overt LOB while amb. in straight trajectory, but did experience LOB while turning to sit down into chair, pt self corrected with UE  support   Ambulation Distance (Feet) 75 Feet   Assistive device None   Gait Pattern Step-through pattern;Decreased stride length;Decreased dorsiflexion - right;Shuffle;Decreased hip/knee flexion - right  shuffle R LE   Ambulation Surface Level;Indoor   Gait velocity 2.62f/sec.  no AD   Standardized Balance Assessment   Standardized Balance Assessment Berg Balance Test   Berg Balance Test   Sit to Stand Able to stand without using hands and stabilize independently   Standing Unsupported Able to stand safely 2 minutes   Sitting with Back Unsupported but Feet Supported on Floor or Stool  Able to sit safely and securely 2 minutes   Stand to Sit Sits safely with minimal use of hands   Transfers Able to transfer safely, minor use of hands   Standing Unsupported with Eyes Closed Able to stand 10 seconds with supervision   Standing Ubsupported with Feet Together Able to place feet together independently and stand for 1 minute with supervision   From Standing, Reach Forward with Outstretched Arm Can reach forward >12 cm safely (5")  8.5" with R UE   From Standing Position, Pick up Object from Floor Able to pick up shoe, needs supervision   From Standing Position, Turn to Look Behind Over each Shoulder Looks behind one side only/other side shows less weight shift   Turn 360 Degrees Able to turn 360 degrees safely one side only in 4 seconds or less   Standing Unsupported, Alternately Place Feet on Step/Stool Able to complete 4 steps without aid or supervision   Standing Unsupported, One Foot in Front Able to plae foot ahead of the other independently and hold 30 seconds   Standing on One Leg Tries to lift leg/unable to hold 3 seconds but remains standing independently   Total Score 44                           PT Education - 12/14/15 1207    Education provided Yes   Education Details PT discussed frequency/duration and outcome measure results. PT also discussed the importance  of using SPC for safety and risk for falls.    Person(s) Educated Patient;Spouse   Methods Explanation   Comprehension Verbalized understanding          PT Short Term Goals - 12/14/15 1215    PT SHORT TERM GOAL #1   Title Pt will be IND in HEP to improve strength, balance, and safety. Target date: 01/11/16   Status New   PT SHORT TERM GOAL #2   Title Pt will improve BERG score to >/=48/56 to decr. falls risk. Target date: 01/11/16   Status New   PT SHORT TERM GOAL #3   Title Pt will improve gait speed to >/=2.60f/sec. without AD to amb. safely in the community. Target date: 01/11/16   Status New   PT SHORT TERM GOAL #4   Title Pt will amb. 300' over even/uneven terrain with LRAD at MOD I level. Target date: 01/11/16   Status New           PT Long Term Goals - 12/14/15 1216    PT LONG TERM GOAL #1   Title Pt will verbalize understanding of CVA risk factors and signs/symptoms to reduce risk of additional CVA. Target date: 02/08/16   Status New   PT LONG TERM GOAL #2   Title Pt will improve BERG score to >/=52/56 to decr. falls risk. Target date: 02/08/16   Status New   PT LONG TERM GOAL #3   Title Pt will amb. 200' over even terrain IND without AD to improve functional mobility. Target date: 02/08/16   Status New   PT LONG TERM GOAL #4   Title Pt will amb. 600' over even/uneven terrain IND without AD to improve functional moblity. Target date: 02/08/16   Status New   PT LONG TERM GOAL #5   Title Assess stairs and write goal if necessary. Target date: 02/08/16   Status New               Plan -  12/14/15 1211    Clinical Impression Statement Pt is a pleasant 50y/o male presenting to OPPT neuro s/p CVA. Pt presented with the following deficits: gait deviations, decreased safety awareness, impaired strength, ataxia during MMT, impaired coordination, impaired balance, and falls risk based on BERG score. Pt's gait speed indicates he would not be able to amb. safely in the community. Pt  experienced tachycardia during hospital stay for AVF placement based on notes, will conitnue to monitor.    Rehab Potential Good   Clinical Impairments Affecting Rehab Potential co-morbidities   PT Frequency 2x / week   PT Duration 8 weeks   PT Treatment/Interventions ADLs/Self Care Home Management;Biofeedback;Canalith Repostioning;Balance training;Therapeutic exercise;Vestibular;Manual techniques;Therapeutic activities;Functional mobility training;Stair training;Gait training;Orthotic Fit/Training;Patient/family education;DME Instruction;Cognitive remediation;Neuromuscular re-education   PT Next Visit Plan CVA Ed, assess stair ability and write goal, Provide HEP for balance, hip ext/abd strengthening, and assess HR.   Consulted and Agree with Plan of Care Patient;Family member/caregiver   Family Member Consulted pt's wife: [name]      Patient will benefit from skilled therapeutic intervention in order to improve the following deficits and impairments:  Abnormal gait, Decreased endurance, Cardiopulmonary status limiting activity, Postural dysfunction, Decreased safety awareness, Decreased coordination, Decreased cognition, Decreased balance, Decreased mobility, Decreased knowledge of use of DME, Decreased strength  Visit Diagnosis: Other abnormalities of gait and mobility - Plan: PT plan of care cert/re-cert  Unspecified lack of coordination - Plan: PT plan of care cert/re-cert  Hemiplegia, unspecified affecting right dominant side ([place]) - Plan: PT plan of care cert/re-cert     Problem List Patient Active Problem List   Diagnosis Date Noted  . ESRD (end stage renal disease) ([place]) 12/03/2015  . PFO (patent foramen ovale) 11/06/2015  . CVA (cerebral infarction)   . ESRD (end stage renal disease) on dialysis ([place])   . S/P dialysis catheter insertion ([place])   . Acute deep vein thrombosis (DVT) of popliteal vein of left lower extremity ([place])   . Leukocytosis   . Labile blood glucose   .  Acute blood loss anemia   . Hemiparesis, aphasia, and dysphagia as late effects of cerebrovascular accident ([place]) 10/13/2015  . Gait disturbance, post-stroke 10/13/2015  . Embolic stroke ([place]) 62/83/1517  . Plasma cell disorder   . Anemia 10/31/2014  . Long term current use of anticoagulant therapy 10/31/2014  . Renal insufficiency 10/31/2014  . Iron deficiency anemia due to chronic blood loss 10/30/2014  . Encounter for therapeutic drug monitoring 10/11/2014  . Pulmonary embolus ([place]) 09/27/2014  . ESRD on dialysis ([place]) 09/27/2014  . MGUS (monoclonal gammopathy of unknown significance) 09/27/2014  . OSA on CPAP 09/27/2014  . Diabetic retinopathy of both eyes ([place]) 02/03/2012  . Sickle cell trait ([place]) 07/19/2011  . Morbid obesity ([place]) 07/19/2011  . PE (pulmonary embolism) 07/18/2011  . SOB (shortness of breath) 07/18/2011  . Hyperkalemia 07/18/2011  . ARF (acute renal failure) ([place]) 07/18/2011  . Leukocytosis, unspecified 07/18/2011  . DM (diabetes mellitus) type II controlled with renal manifestation ([place]) 07/18/2011  . Cardiac enzymes elevated 07/18/2011  . Metabolic acidosis 61/60/7371  . HTN (hypertension) 07/18/2011    Miller,Jennifer L 12/14/2015, 12:25 PM  [facility] [address] [place], [place], [address] Phone: [phone]   Fax:  [phone]  Name: Isaiah [name] [facility] MRN: [id number] Date of Birth: [birth year]   Geoffry Paradise, PT,DPT 12/14/2015 12:25 PM Phone: [phone] Fax: [phone]

## 2015-12-26 HISTORY — DX: Hypocalcemia: E83.51

## 2015-12-26 HISTORY — DX: Familial hypophosphatemia: E83.31

## 2015-12-27 MED FILL — SENSIPAR 60 MG TABLET: 60 | 30 days supply | Qty: 30 | Fill #0

## 2015-12-31 ENCOUNTER — Encounter: Payer: Self-pay | Admitting: Physical Therapy

## 2015-12-31 ENCOUNTER — Other Ambulatory Visit: Payer: Self-pay | Admitting: Physical Medicine and Rehabilitation

## 2015-12-31 ENCOUNTER — Ambulatory Visit: Payer: BLUE CROSS/BLUE SHIELD

## 2015-12-31 ENCOUNTER — Ambulatory Visit: Payer: BLUE CROSS/BLUE SHIELD | Admitting: Occupational Therapy

## 2015-12-31 ENCOUNTER — Ambulatory Visit: Payer: BLUE CROSS/BLUE SHIELD | Admitting: Physical Therapy

## 2015-12-31 DIAGNOSIS — R2689 Other abnormalities of gait and mobility: Secondary | ICD-10-CM | POA: Diagnosis not present

## 2015-12-31 DIAGNOSIS — R278 Other lack of coordination: Secondary | ICD-10-CM

## 2015-12-31 DIAGNOSIS — R471 Dysarthria and anarthria: Secondary | ICD-10-CM

## 2015-12-31 DIAGNOSIS — I69351 Hemiplegia and hemiparesis following cerebral infarction affecting right dominant side: Secondary | ICD-10-CM | POA: Diagnosis not present

## 2015-12-31 DIAGNOSIS — G8191 Hemiplegia, unspecified affecting right dominant side: Secondary | ICD-10-CM

## 2015-12-31 DIAGNOSIS — R482 Apraxia: Secondary | ICD-10-CM

## 2015-12-31 DIAGNOSIS — I69315 Cognitive social or emotional deficit following cerebral infarction: Secondary | ICD-10-CM

## 2015-12-31 DIAGNOSIS — R279 Unspecified lack of coordination: Secondary | ICD-10-CM | POA: Diagnosis not present

## 2015-12-31 DIAGNOSIS — R41841 Cognitive communication deficit: Secondary | ICD-10-CM

## 2015-12-31 NOTE — Therapy (Signed)
[facility] [address] [place], [place], [address] Phone: [phone]   Fax:  [phone]  Occupational Therapy Treatment  Patient Details  Name: Isaiah [name] [facility] MRN: [id number] Date of Birth: 06-Aug-1966 Referring Provider: Letta Pate  Encounter Date: 12/31/2015      OT End of Session - 12/31/15 0940    Visit Number 2   Number of Visits 17   Date for OT Re-Evaluation 02/11/16   Authorization Type BC[employer]   Authorization Time Period await clarification if PT/OT is comb 20   Authorization - Visit Number 2   Authorization - Number of Visits 10   OT Start Time 0848   OT Stop Time 0930   OT Time Calculation (min) 42 min   Activity Tolerance Patient tolerated treatment well   Behavior During Therapy [facility] for tasks assessed/performed      Past Medical History  Diagnosis Date  . Diabetes mellitus without complication ([place])   . Sickle cell-thalassemia disease ([place])     a. Sickle cell trait.  . Pulmonary embolism ([place]) 07/2011    a. Tx with Coumadin for 6 months (unknown cause per patient).  . Hyperkalemia 07/2011  . Acute renal failure ([place]) 07/2011  . Monoclonal gammopathy   . Sleep apnea     a. uses CPAP.  [name] Hypertension   . Gout   . Pulmonary embolism ([place]) 07/2011; 09/27/2014    a. Bilat PE 07/2011 - unclear cause, tx with 6 months Coumadin.;   . Monoclonal gammopathies   . Anemia   . Asthma   . OSA on CPAP   . History of recent blood transfusion 10/27/14    2 Units PRBC's  . ESRD (end stage renal disease) on dialysis [facility])     Past Surgical History  Procedure Laterality Date  . Cholecystectomy    . Bone marrow biopsy    . Other surgical history      Retinal surgery  . Pars plana vitrectomy  02/17/2012    Procedure: PARS PLANA VITRECTOMY WITH 25 GAUGE;  Surgeon: Hayden Pedro, MD;  Location: [facility];  Service: Ophthalmology;  Laterality: Right;  . Cholecystectomy  1990's?  . Eye surgery Right   .  Insertion of dialysis catheter Right 10/16/2015    Procedure: INSERTION OF PALINDROME DIALYSIS CATHETER ;  Surgeon: Elam Dutch, MD;  Location: [facility];  Service: Vascular;  Laterality: Right;  . Av fistula placement Right 12/05/2015    Procedure: INSERTION OF ARTERIOVENOUS (AV) GORE-TEX GRAFT ARM;  Surgeon: Elam Dutch, MD;  Location: [facility];  Service: Vascular;  Laterality: Right;    There were no vitals filed for this visit.      Subjective Assessment - 12/31/15 0851    Subjective  Pt reports that he has been doing some movement at home, but can't really eat with right hand   Pertinent History see Epic    Patient Stated Goals to use right hand better   Currently in Pain? No/denies       Began instruction in normal movement patterns/proper positioning with HEP.    Pt issued foam grip for pen.  Attempted to write name and pt was only able to write the "D", Also practiced vertical lines and tracing "D"                       OT Education - 12/31/15 0924    Education Details Initial HEP (cane ex, coordination)--see pt instructions  Person(s) Educated Patient   Methods Explanation;Demonstration;Verbal cues;Handout;Tactile cues   Comprehension Verbalized understanding;Returned demonstration;Verbal cues required  min-mod cues           OT Short Term Goals - 12/14/15 1622    OT SHORT TERM GOAL #1   Title I with initiall HEP   Time 4   Period Weeks   Status New   OT SHORT TERM GOAL #2   Title Pt will perform all basic ADLS with supervision.   Time 4   Period Weeks   Status New   OT SHORT TERM GOAL #3   Title Pt will demonstrate improved RUE functional use as evidenced by performing 15 blocks on box/ blocks.   Time 4   Period Weeks   Status New   OT SHORT TERM GOAL #4   Title Pt will demonstrate ability to retrieve a light weight object at 115 shoulder flexion with RUE.   Time 4   Period Weeks   Status New   OT SHORT TERM GOAL #5   Title Further  assess vision and set goal PRN.   Time 4   Period Weeks   Status New   Additional Short Term Goals   Additional Short Term Goals Yes   OT SHORT TERM GOAL #6   Title Pt will report feeding himself with RUE 50% of the time.   Time 4   Period Weeks   Status New           OT Long Term Goals - 12/14/15 1624    OT LONG TERM GOAL #1   Title I with updated HEP.   Time 8   Period Weeks   Status New   OT LONG TERM GOAL #2   Title Pt will demonstrate ability to write his name legibily.   Time 8   Period Weeks   Status New   OT LONG TERM GOAL #3   Title Pt will use RUE as an active assist for ADLS at least 99% of the time with pain no greater than 3/10.   Time 8   Period Weeks   Status New   OT LONG TERM GOAL #4   Title Pt will demonstrate improved finge motor coordination as evidenced by placing 4 pegs in 9 hole peg test board in 2 mins or less.   Time 8   Period Weeks   Status New   OT LONG TERM GOAL #5   Title Pt will demonstrate RUE grip strength of 15 lbs for increased functional use.   Time 8   Period Weeks   Status New   Long Term Additional Goals   Additional Long Term Goals Yes   OT LONG TERM GOAL #6   Title Pt will perform all basic ADLs and simple meal prep modified independently.   Time 8   Period Weeks   Status New               Plan - 12/31/15 0940    Clinical Impression Statement Coordination improved with repetition today.  Pt verbalized understanding of initial HEP.   Plan assess vision further, review HEP prn.   OT Home Exercise Plan Education issued:  12/31/15 initial HEP (coordination, cane ex)   Consulted and Agree with Plan of Care Patient      Patient will benefit from skilled therapeutic intervention in order to improve the following deficits and impairments:     Visit Diagnosis: Hemiplegia and hemiparesis following cerebral infarction affecting right dominant side (  [place])  Other lack of coordination  Cognitive social or emotional  deficit following cerebral infarction    Problem List Patient Active Problem List   Diagnosis Date Noted  . ESRD (end stage renal disease) ([place]) 12/03/2015  . PFO (patent foramen ovale) 11/06/2015  . CVA (cerebral infarction)   . ESRD (end stage renal disease) on dialysis ([place])   . S/P dialysis catheter insertion ([place])   . Acute deep vein thrombosis (DVT) of popliteal vein of left lower extremity ([place])   . Leukocytosis   . Labile blood glucose   . Acute blood loss anemia   . Hemiparesis, aphasia, and dysphagia as late effects of cerebrovascular accident ([place]) 10/13/2015  . Gait disturbance, post-stroke 10/13/2015  . Embolic stroke ([place]) 67/51/9824  . Plasma cell disorder   . Anemia 10/31/2014  . Long term current use of anticoagulant therapy 10/31/2014  . Renal insufficiency 10/31/2014  . Iron deficiency anemia due to chronic blood loss 10/30/2014  . Encounter for therapeutic drug monitoring 10/11/2014  . Pulmonary embolus ([place]) 09/27/2014  . ESRD on dialysis ([place]) 09/27/2014  . MGUS (monoclonal gammopathy of unknown significance) 09/27/2014  . OSA on CPAP 09/27/2014  . Diabetic retinopathy of both eyes ([place]) 02/03/2012  . Sickle cell trait ([place]) 07/19/2011  . Morbid obesity ([place]) 07/19/2011  . PE (pulmonary embolism) 07/18/2011  . SOB (shortness of breath) 07/18/2011  . Hyperkalemia 07/18/2011  . ARF (acute renal failure) ([place]) 07/18/2011  . Leukocytosis, unspecified 07/18/2011  . DM (diabetes mellitus) type II controlled with renal manifestation ([place]) 07/18/2011  . Cardiac enzymes elevated 07/18/2011  . Metabolic acidosis 29/98/0699  . HTN (hypertension) 07/18/2011    [facility] 12/31/2015, 9:42 AM  [facility] [address] [place], [place], [address] Phone: [phone]   Fax:  [phone]  Name: Isaiah [name] [facility] MRN: [id number] Date of Birth: Jan 10, 1966  Vianne Bulls, OTR/L [facility] [address] [place], [place]  [address] [phone] phone [phone] 12/31/2015 9:42 AM

## 2015-12-31 NOTE — Patient Instructions (Signed)
Continue to practice your words every day.

## 2015-12-31 NOTE — Patient Instructions (Addendum)
ROM: Abduction - Wand   Holding cane  with right hand thumb up (holding handle), push wand directly out to side, leading with other hand palm down, until stretch is felt. Hold 5 seconds. Repeat 10 times per set. Do 2-3 sessions per day. (Lying down)    [employer] - Standing   With arms straight, hold cane forward at waist. Hold handle of cane with right hand with thumb/hook turned up.  Raise cane above head. Hold 3 seconds. Repeat 10 times. Do 2 times per day.    Coordination Activities  Perform the following activities for 30 minutes 1-2 times per day with right hand(s).   Flip cards over by opening your hand  Place deck of cards on table and try to use just your thumb to push cards off top of deck (out to the side)  Pick up coins, buttons, marbles, dried beans/pasta of different sizes and place in container.  Pick up coins and place in container or coin bank.  Screw together nuts and bolts, then unfasten. (or put bottle caps on empty bottles and remove).  Try to make the wrist move.  Wipe table top  Bring plastic cup to mouth, then return to table top and release  Turn doorknob, turn on light switches  Open/close cabinet door with handle  Fold towels  Try to trace letters, write your name, make vertical/horizontal lines or circles using pen with foam.

## 2015-12-31 NOTE — Therapy (Addendum)
[facility] [address] [place], [place], [address] Phone: [phone]   Fax:  [phone]  Speech Language Pathology Treatment  Patient Details  Name: Isaiah [name] MRN: [id number] Date of Birth: Oct 19, 1965 Referring Provider: Alysia Penna, MD  Encounter Date: 12/31/2015      End of Session - 12/31/15 0926    Visit Number 2   Number of Visits 15   Date for SLP Re-Evaluation 02/13/16   SLP Start Time 0806   SLP Stop Time  0845   SLP Time Calculation (min) 39 min   Activity Tolerance Patient tolerated treatment well      Past Medical History  Diagnosis Date  . Diabetes mellitus without complication ([place])   . Sickle cell-thalassemia disease ([place])     a. Sickle cell trait.  . Pulmonary embolism ([place]) 07/2011    a. Tx with Coumadin for 6 months (unknown cause per patient).  . Hyperkalemia 07/2011  . Acute renal failure ([place]) 07/2011  . Monoclonal gammopathy   . Sleep apnea     a. uses CPAP.  [name] Hypertension   . Gout   . Pulmonary embolism ([place]) 07/2011; 09/27/2014    a. Bilat PE 07/2011 - unclear cause, tx with 6 months Coumadin.;   . Monoclonal gammopathies   . Anemia   . Asthma   . OSA on CPAP   . History of recent blood transfusion 10/27/14    2 Units PRBC's  . ESRD (end stage renal disease) on dialysis [name])     Past Surgical History  Procedure Laterality Date  . Cholecystectomy    . Bone marrow biopsy    . Other surgical history      Retinal surgery  . Pars plana vitrectomy  02/17/2012    Procedure: PARS PLANA VITRECTOMY WITH 25 GAUGE;  Surgeon: Hayden Pedro, MD;  Location: [facility];  Service: Ophthalmology;  Laterality: Right;  . Cholecystectomy  1990's?  . Eye surgery Right   . Insertion of dialysis catheter Right 10/16/2015    Procedure: INSERTION OF PALINDROME DIALYSIS CATHETER ;  Surgeon: Elam Dutch, MD;  Location: [facility];  Service: Vascular;  Laterality: Right;  . Av fistula  placement Right 12/05/2015    Procedure: INSERTION OF ARTERIOVENOUS (AV) GORE-TEX GRAFT ARM;  Surgeon: Elam Dutch, MD;  Location: [facility];  Service: Vascular;  Laterality: Right;    There were no vitals filed for this visit.      Subjective Assessment - 12/31/15 0816    Subjective "all this rain!"   Currently in Pain? No/denies               ADULT SLP TREATMENT - 12/31/15 0819    General Information   Behavior/Cognition Alert;Cooperative;Pleasant mood   Treatment Provided   Treatment provided Cognitive-Linquistic   Cognitive-Linquistic Treatment   Treatment focused on Cognition;Apraxia   Skilled Treatment Cognition (26 minutes) Pt was having trouble with recall (immediate recall-working memory) with odd-word out task. SLP reasoned with pt about strategies and he and SLP developed the plan of repeating the words to himself twice. This helped pt considerably to recall words (80-85% success). Speech tx (<30 minutes): To target apraxia, SLP /pt reviewed multisyllabic words with SLP stressing to pt to reduce rate  Pt 80% successful with repeating words with accuracy; 100% successful with rate reduction.   Assessment / Recommendations / Plan   Plan Continue with current plan of care  SLP Education - 12/31/15 0858    Education provided Yes   Education Details compenstions for memory   Person(s) Educated Patient   Methods Explanation;Demonstration;Verbal cues   Comprehension Verbalized understanding;Returned demonstration;Verbal cues required          SLP Short Term Goals - 12/31/15 0928    SLP SHORT TERM GOAL #1   Title pt will demo HEP for apraxia/motor plannning with independence over 3 sessions   Time 4   Period Weeks  or 8 visits, for all STGs   Status On-going   SLP SHORT TERM GOAL #2   Title pt will demo HEP for dysarthria with independence over 3 sessions   Time 4   Period Weeks   Status On-going   SLP SHORT TERM GOAL #3   Title pt will demo  speech 95% accurate in sentence-length responses with modified independence (compensations) over 3 sessions   Time 4   Period Weeks   Status On-going   SLP SHORT TERM GOAL #4   Title pt will provide 4 memory strategies to SLP   Time 4   Period Weeks   Status New          SLP Long Term Goals - 12/31/15 9767    SLP LONG TERM GOAL #1   Title pt will demo functional speech in 20 minutes mod complex/complex conversation with modified independnence   Time 8   Period Weeks  or 15 visits, for all LTGs   Status On-going   SLP LONG TERM GOAL #2   Title pt will demo HEP for speech-motor planning indpendently over 5 total sessions    Time 8   Period Weeks   Status On-going   SLP LONG TERM GOAL #3   Title pt will demo HEP for dysarthria independently over 5 total sessions   Time 8   Period Weeks   Status On-going   SLP LONG TERM GOAL #4   Title pt will use a memory strategy in 3 sessions   Time 8   Period Weeks   Status New          Plan - 12/31/15 3419    Clinical Impression Statement Pt cont with motor speech deficits apraxia and dysarthria. 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 Deficit in working memory/immediate memory also demonstrated today and compensation of repetition was targeted. LTG was added for memory compensation use. Pt is motivated for change.  Skilled ST would cont to benefit pt for incr'd independence and possible return to work.   Speech Therapy Frequency 2x / week   Duration --  8 weeks, or 15 sessions   Treatment/Interventions SLP instruction and feedback;Compensatory strategies;Internal/external aids;Oral motor exercises;Patient/family education;Functional tasks;Cueing hierarchy   Potential to Achieve Goals Good   Potential Considerations Severity of impairments   Consulted and Agree with Plan of Care Patient      Patient will benefit from skilled  therapeutic intervention in order to improve the following deficits and impairments:   Dysarthria and anarthria  Apraxia  Cognitive-communication deficit  Problem List Patient Active Problem List   Diagnosis Date Noted  . ESRD (end stage renal disease) ([place]) 12/03/2015  . PFO (patent foramen ovale) 11/06/2015  . CVA (cerebral infarction)   . ESRD (end stage renal disease) on dialysis ([place])   . S/P dialysis catheter insertion ([place])   . Acute deep vein thrombosis (DVT)  of popliteal vein of left lower extremity ([place])   . Leukocytosis   . Labile blood glucose   . Acute blood loss anemia   . Hemiparesis, aphasia, and dysphagia as late effects of cerebrovascular accident ([place]) 10/13/2015  . Gait disturbance, post-stroke 10/13/2015  . Embolic stroke ([place]) 01/60/1093  . Plasma cell disorder   . Anemia 10/31/2014  . Long term current use of anticoagulant therapy 10/31/2014  . Renal insufficiency 10/31/2014  . Iron deficiency anemia due to chronic blood loss 10/30/2014  . Encounter for therapeutic drug monitoring 10/11/2014  . Pulmonary embolus ([place]) 09/27/2014  . ESRD on dialysis ([place]) 09/27/2014  . MGUS (monoclonal gammopathy of unknown significance) 09/27/2014  . OSA on CPAP 09/27/2014  . Diabetic retinopathy of both eyes ([place]) 02/03/2012  . Sickle cell trait ([place]) 07/19/2011  . Morbid obesity ([place]) 07/19/2011  . PE (pulmonary embolism) 07/18/2011  . SOB (shortness of breath) 07/18/2011  . Hyperkalemia 07/18/2011  . ARF (acute renal failure) ([place]) 07/18/2011  . Leukocytosis, unspecified 07/18/2011  . DM (diabetes mellitus) type II controlled with renal manifestation ([place]) 07/18/2011  . Cardiac enzymes elevated 07/18/2011  . Metabolic acidosis 23/55/7322  . HTN (hypertension) 07/18/2011    [facility] [place] ,[place]  12/31/2015, 9:30 AM  [facility] [address] [place], [place], [address] Phone: [phone]    Fax:  [phone]   Name: Isaiah [name] [facility] MRN: [id number] Date of Birth: [birth year]

## 2015-12-31 NOTE — Patient Instructions (Addendum)
ABDUCTION: Side-Lying (Active)    Lie on left side, top leg straight. Raise top leg as far as possible. Use ___ lbs. Complete _2__ sets of _10__ repetitions. Perform _1-2__ sessions per day.  http://gtsc.exer.us/94   Copyright  VHI. All rights reserved.  EXTENSION: Prone - Knee Flexed (Active)    Lie on stomach, right knee bent to 90. Lift leg toward ceiling. Use ___ lbs. Complete __2_ sets of _10__ repetitions. Perform _1-2__ sessions per day.  http://gtsc.exer.us/66   Copyright  VHI. All rights reserved.  Feet Heel-Toe "Tandem" (Compliant Surface) Head Motion - Eyes Open    With eyes open, start standing level surface 30seconds  then progress on  compliant surface: __pillow______, right foot directly in front of the other then Left foot in front, then progress with moving head slowly: up and down. Repeat ____ times per session. Do ____ sessions per day.  Copyright  VHI. All rights reserved.

## 2015-12-31 NOTE — Therapy (Signed)
[facility] [address] [place], [place], [address] Phone: [phone]   Fax:  [phone]  Physical Therapy Treatment  Patient Details  Name: Isaiah [name] [facility] MRN: [id number] Date of Birth: December 27, 1965 Referring Provider: Dr. Letta Pate  Encounter Date: 12/31/2015      PT End of Session - 12/31/15 1357    Visit Number 2   Number of Visits 17   Date for PT Re-Evaluation 02/12/16   Authorization Type BCBS: pt may be limited to 20 OT/PT visits combined-checking with Lattie Haw   PT Start Time 7517   PT Stop Time 1015   PT Time Calculation (min) 41 min   Activity Tolerance Patient tolerated treatment well   Behavior During Therapy [facility] for tasks assessed/performed      Past Medical History  Diagnosis Date  . Diabetes mellitus without complication ([place])   . Sickle cell-thalassemia disease ([place])     a. Sickle cell trait.  . Pulmonary embolism ([place]) 07/2011    a. Tx with Coumadin for 6 months (unknown cause per patient).  . Hyperkalemia 07/2011  . Acute renal failure ([place]) 07/2011  . Monoclonal gammopathy   . Sleep apnea     a. uses CPAP.  [name] Hypertension   . Gout   . Pulmonary embolism ([place]) 07/2011; 09/27/2014    a. Bilat PE 07/2011 - unclear cause, tx with 6 months Coumadin.;   . Monoclonal gammopathies   . Anemia   . Asthma   . OSA on CPAP   . History of recent blood transfusion 10/27/14    2 Units PRBC's  . ESRD (end stage renal disease) on dialysis [facility])     Past Surgical History  Procedure Laterality Date  . Cholecystectomy    . Bone marrow biopsy    . Other surgical history      Retinal surgery  . Pars plana vitrectomy  02/17/2012    Procedure: PARS PLANA VITRECTOMY WITH 25 GAUGE;  Surgeon: Hayden Pedro, MD;  Location: [place];  Service: Ophthalmology;  Laterality: Right;  . Cholecystectomy  1990's?  . Eye surgery Right   . Insertion of dialysis catheter Right 10/16/2015    Procedure: INSERTION OF  PALINDROME DIALYSIS CATHETER ;  Surgeon: Elam Dutch, MD;  Location: [facility];  Service: Vascular;  Laterality: Right;  . Av fistula placement Right 12/05/2015    Procedure: INSERTION OF ARTERIOVENOUS (AV) GORE-TEX GRAFT ARM;  Surgeon: Elam Dutch, MD;  Location: [facility];  Service: Vascular;  Laterality: Right;    There were no vitals filed for this visit.                       [facility] Adult PT Treatment/Exercise - 12/31/15 0001    Knee/Hip Exercises: Supine   Bridges Strengthening;10 reps   Knee/Hip Exercises: Sidelying   Hip ABduction Strengthening;Right;20 reps   Knee/Hip Exercises: Prone   Hip Extension Right;Strengthening;20 reps  knee bent             Balance Exercises - 12/31/15 0957    Balance Exercises: Standing   Standing Eyes Opened Foam/compliant surface;Narrow base of support (BOS)  Head movement eyes open then closed; no UE support needed   Tandem Stance Eyes open;3 reps;30 secs;Intermittent upper extremity support           PT Education - 12/31/15 1010    Education provided Yes   Education Details HEP for right LE and balance see handout below.  Person(s) Educated Patient   Methods Explanation;Demonstration;Tactile cues;Verbal cues;Handout   Comprehension Verbalized understanding;Need further instruction          PT Short Term Goals - 12/14/15 1215    PT SHORT TERM GOAL #1   Title Pt will be IND in HEP to improve strength, balance, and safety. Target date: 01/11/16   Status New   PT SHORT TERM GOAL #2   Title Pt will improve BERG score to >/=48/56 to decr. falls risk. Target date: 01/11/16   Status New   PT SHORT TERM GOAL #3   Title Pt will improve gait speed to >/=2.63f/sec. without AD to amb. safely in the community. Target date: 01/11/16   Status New   PT SHORT TERM GOAL #4   Title Pt will amb. 300' over even/uneven terrain with LRAD at MOD I level. Target date: 01/11/16   Status New           PT Long Term Goals -  12/14/15 1216    PT LONG TERM GOAL #1   Title Pt will verbalize understanding of CVA risk factors and signs/symptoms to reduce risk of additional CVA. Target date: 02/08/16   Status New   PT LONG TERM GOAL #2   Title Pt will improve BERG score to >/=52/56 to decr. falls risk. Target date: 02/08/16   Status New   PT LONG TERM GOAL #3   Title Pt will amb. 200' over even terrain IND without AD to improve functional mobility. Target date: 02/08/16   Status New   PT LONG TERM GOAL #4   Title Pt will amb. 600' over even/uneven terrain IND without AD to improve functional moblity. Target date: 02/08/16   Status New   PT LONG TERM GOAL #5   Title Assess stairs and write goal if necessary. Target date: 02/08/16   Status New               Plan - 12/31/15 1016    Clinical Impression Statement Initiated HEP ; Pt demonstrates difficulty with high level balance activites ie., tandem and SLS tasks.   Rehab Potential Good   Clinical Impairments Affecting Rehab Potential co-morbidities   PT Frequency 2x / week   PT Duration 8 weeks   PT Treatment/Interventions ADLs/Self Care Home Management;Biofeedback;Canalith Repostioning;Balance training;Therapeutic exercise;Vestibular;Manual techniques;Therapeutic activities;Functional mobility training;Stair training;Gait training;Orthotic Fit/Training;Patient/family education;DME Instruction;Cognitive remediation;Neuromuscular re-education   PT Next Visit Plan SOT? CVA Ed, assess stair ability and write goal, Review HEP for balance, hip ext/abd strengthening, and assess HR.   Consulted and Agree with Plan of Care Patient;Family member/caregiver   Family Member Consulted pt's wife: SSunday Spillers     Patient will benefit from skilled therapeutic intervention in order to improve the following deficits and impairments:  Abnormal gait, Decreased endurance, Cardiopulmonary status limiting activity, Postural dysfunction, Decreased safety awareness, Decreased coordination,  Decreased cognition, Decreased balance, Decreased mobility, Decreased knowledge of use of DME, Decreased strength  Visit Diagnosis: Other lack of coordination  Hemiplegia, unspecified affecting right dominant side (HCC)  Other abnormalities of gait and mobility     Problem List Patient Active Problem List   Diagnosis Date Noted  . ESRD (end stage renal disease) ([place] 12/03/2015  . PFO (patent foramen ovale) 11/06/2015  . CVA (cerebral infarction)   . ESRD (end stage renal disease) on dialysis ([place]   . S/P dialysis catheter insertion ([place]   . Acute deep vein thrombosis (DVT) of popliteal vein of left lower extremity ([place]   . Leukocytosis   .  Labile blood glucose   . Acute blood loss anemia   . Hemiparesis, aphasia, and dysphagia as late effects of cerebrovascular accident ([place]) 10/13/2015  . Gait disturbance, post-stroke 10/13/2015  . Embolic stroke ([place]) 79/98/7215  . Plasma cell disorder   . Anemia 10/31/2014  . Long term current use of anticoagulant therapy 10/31/2014  . Renal insufficiency 10/31/2014  . Iron deficiency anemia due to chronic blood loss 10/30/2014  . Encounter for therapeutic drug monitoring 10/11/2014  . Pulmonary embolus ([place]) 09/27/2014  . ESRD on dialysis ([place]) 09/27/2014  . MGUS (monoclonal gammopathy of unknown significance) 09/27/2014  . OSA on CPAP 09/27/2014  . Diabetic retinopathy of both eyes ([place]) 02/03/2012  . Sickle cell trait ([place]) 07/19/2011  . Morbid obesity ([place]) 07/19/2011  . PE (pulmonary embolism) 07/18/2011  . SOB (shortness of breath) 07/18/2011  . Hyperkalemia 07/18/2011  . ARF (acute renal failure) ([place]) 07/18/2011  . Leukocytosis, unspecified 07/18/2011  . DM (diabetes mellitus) type II controlled with renal manifestation ([place]) 07/18/2011  . Cardiac enzymes elevated 07/18/2011  . Metabolic acidosis 87/27/6184  . HTN (hypertension) 07/18/2011    Bjorn Loser, PTA  12/31/2015, 2:00 PM [facility] [address] [place], [place], [address] Phone: [phone]   Fax:  [phone]  Name: Pilar Corrales [facility] MRN: [id number] Date of Birth: 1965-10-10

## 2016-01-01 ENCOUNTER — Telehealth: Payer: Self-pay | Admitting: Oncology

## 2016-01-01 ENCOUNTER — Encounter: Payer: Self-pay | Admitting: Neurology

## 2016-01-01 ENCOUNTER — Ambulatory Visit (HOSPITAL_BASED_OUTPATIENT_CLINIC_OR_DEPARTMENT_OTHER): Payer: BLUE CROSS/BLUE SHIELD | Admitting: Oncology

## 2016-01-01 ENCOUNTER — Ambulatory Visit (INDEPENDENT_AMBULATORY_CARE_PROVIDER_SITE_OTHER): Payer: BLUE CROSS/BLUE SHIELD | Admitting: Neurology

## 2016-01-01 ENCOUNTER — Other Ambulatory Visit (HOSPITAL_BASED_OUTPATIENT_CLINIC_OR_DEPARTMENT_OTHER): Payer: BLUE CROSS/BLUE SHIELD

## 2016-01-01 ENCOUNTER — Ambulatory Visit: Payer: 59 | Admitting: Neurology

## 2016-01-01 VITALS — BP 110/75 | HR 99 | Ht 70.0 in | Wt 205.6 lb

## 2016-01-01 VITALS — BP 109/69 | HR 93 | Temp 98.4°F | Resp 18 | Ht 70.0 in | Wt 203.8 lb

## 2016-01-01 DIAGNOSIS — G8110 Spastic hemiplegia affecting unspecified side: Secondary | ICD-10-CM

## 2016-01-01 DIAGNOSIS — Z7901 Long term (current) use of anticoagulants: Secondary | ICD-10-CM | POA: Diagnosis not present

## 2016-01-01 DIAGNOSIS — D472 Monoclonal gammopathy: Secondary | ICD-10-CM

## 2016-01-01 DIAGNOSIS — Z86718 Personal history of other venous thrombosis and embolism: Secondary | ICD-10-CM | POA: Diagnosis not present

## 2016-01-01 DIAGNOSIS — R1012 Left upper quadrant pain: Secondary | ICD-10-CM | POA: Diagnosis not present

## 2016-01-01 DIAGNOSIS — I10 Essential (primary) hypertension: Secondary | ICD-10-CM | POA: Diagnosis not present

## 2016-01-01 DIAGNOSIS — D649 Anemia, unspecified: Secondary | ICD-10-CM | POA: Diagnosis not present

## 2016-01-01 DIAGNOSIS — I639 Cerebral infarction, unspecified: Secondary | ICD-10-CM | POA: Diagnosis not present

## 2016-01-01 DIAGNOSIS — N19 Unspecified kidney failure: Secondary | ICD-10-CM

## 2016-01-01 DIAGNOSIS — Z8673 Personal history of transient ischemic attack (TIA), and cerebral infarction without residual deficits: Secondary | ICD-10-CM

## 2016-01-01 LAB — COMPREHENSIVE METABOLIC PANEL
ALT: 9 U/L (ref 0–55)
AST: 15 U/L (ref 5–34)
Albumin: 3.6 g/dL (ref 3.5–5.0)
Alkaline Phosphatase: 104 U/L (ref 40–150)
Anion Gap: 11 mEq/L (ref 3–11)
BUN: 18.5 mg/dL (ref 7.0–26.0)
CO2: 25 mEq/L (ref 22–29)
Calcium: 8.9 mg/dL (ref 8.4–10.4)
Chloride: 98 mEq/L (ref 98–109)
Creatinine: 4.7 mg/dL (ref 0.7–1.3)
EGFR: 15 mL/min/{1.73_m2} — ABNORMAL LOW (ref >90)
Glucose: 81 mg/dl (ref 70–140)
Potassium: 3 mEq/L — CL (ref 3.5–5.1)
Sodium: 134 mEq/L — ABNORMAL LOW (ref 136–145)
Total Bilirubin: 1.71 mg/dL — ABNORMAL HIGH (ref 0.20–1.20)
Total Protein: 8.7 g/dL — ABNORMAL HIGH (ref 6.4–8.3)

## 2016-01-01 LAB — CBC WITH DIFFERENTIAL/PLATELET
BASO%: 0.9 % (ref 0.0–2.0)
Basophils Absolute: 0.2 10*3/uL — ABNORMAL HIGH (ref 0.0–0.1)
EOS%: 1.1 % (ref 0.0–7.0)
Eosinophils Absolute: 0.2 10*3/uL (ref 0.0–0.5)
HCT: 25.4 % — ABNORMAL LOW (ref 38.4–49.9)
HGB: 9.2 g/dL — ABNORMAL LOW (ref 13.0–17.1)
LYMPH%: 14.1 % (ref 14.0–49.0)
MCH: 28.8 pg (ref 27.2–33.4)
MCHC: 36.2 g/dL — ABNORMAL HIGH (ref 32.0–36.0)
MCV: 79.6 fL (ref 79.3–98.0)
MONO#: 1.3 10*3/uL — ABNORMAL HIGH (ref 0.1–0.9)
MONO%: 6.5 % (ref 0.0–14.0)
NEUT#: 15.6 10*3/uL — ABNORMAL HIGH (ref 1.5–6.5)
NEUT%: 77.4 % — ABNORMAL HIGH (ref 39.0–75.0)
Platelets: 588 10*3/uL — ABNORMAL HIGH (ref 140–400)
RBC: 3.19 10*6/uL — ABNORMAL LOW (ref 4.20–5.82)
RDW: 17.7 % — ABNORMAL HIGH (ref 11.0–14.6)
WBC: 20.2 10*3/uL — ABNORMAL HIGH (ref 4.0–10.3)
lymph#: 2.9 10*3/uL (ref 0.9–3.3)
nRBC: 2 % — ABNORMAL HIGH (ref 0–0)

## 2016-01-01 LAB — TECHNOLOGIST REVIEW

## 2016-01-01 MED ORDER — BACLOFEN 10 MG PO TABS: 10.0000 mg | ORAL_TABLET | Freq: Three times a day (TID) | ORAL | Status: DC

## 2016-01-01 MED FILL — MIDODRINE HCL 2.5 MG TABLET: 2.5 | 90 days supply | Qty: 180 | Fill #0

## 2016-01-01 MED FILL — PANTOPRAZOLE SOD DR 40 MG T: 40 | 90 days supply | Qty: 180 | Fill #0

## 2016-01-01 NOTE — Patient Instructions (Signed)
I had a long d/w patient and his wife about his recent strokes from paradoxical embolism, PFO, risk for recurrent stroke/TIAs, personally independently reviewed imaging studies and stroke evaluation results and answered questions.Continue warfarin daily  for secondary stroke prevention  Given h/o recent DVT/PE and maintain strict control of hypertension with blood pressure goal below 130/90, diabetes with hemoglobin A1c goal below 6.5% and lipids with LDL cholesterol goal below 70 mg/dL. I also advised the patient to continue ongoing physical, occupational and speech therapy. He was advised not to return to work until next follow-up visit  with me in 3 months or call earlier if necessary  Stroke Prevention Some medical conditions and behaviors are associated with an increased chance of having a stroke. You may prevent a stroke by making healthy choices and managing medical conditions. HOW CAN I REDUCE MY RISK OF HAVING A STROKE?   Stay physically active. Get at least 30 minutes of activity on most or all days.  Do not smoke. It may also be helpful to avoid exposure to secondhand smoke.  Limit alcohol use. Moderate alcohol use is considered to be:  No more than 2 drinks per day for men.  No more than 1 drink per day for nonpregnant women.  Eat healthy foods. This involves:  Eating 5 or more servings of fruits and vegetables a day.  Making dietary changes that address high blood pressure (hypertension), high cholesterol, diabetes, or obesity.  Manage your cholesterol levels.  Making food choices that are high in fiber and low in saturated fat, trans fat, and cholesterol may control cholesterol levels.  Take any prescribed medicines to control cholesterol as directed by your health care provider.  Manage your diabetes.  Controlling your carbohydrate and sugar intake is recommended to manage diabetes.  Take any prescribed medicines to control diabetes as directed by your health care  provider.  Control your hypertension.  Making food choices that are low in salt (sodium), saturated fat, trans fat, and cholesterol is recommended to manage hypertension.  Ask your health care provider if you need treatment to lower your blood pressure. Take any prescribed medicines to control hypertension as directed by your health care provider.  If you are 47-24 years of age, have your blood pressure checked every 3-5 years. If you are 20 years of age or older, have your blood pressure checked every year.  Maintain a healthy weight.  Reducing calorie intake and making food choices that are low in sodium, saturated fat, trans fat, and cholesterol are recommended to manage weight.  Stop drug abuse.  Avoid taking birth control pills.  Talk to your health care provider about the risks of taking birth control pills if you are over 31 years old, smoke, get migraines, or have ever had a blood clot.  Get evaluated for sleep disorders (sleep apnea).  Talk to your health care provider about getting a sleep evaluation if you snore a lot or have excessive sleepiness.  Take medicines only as directed by your health care provider.  For some people, aspirin or blood thinners (anticoagulants) are helpful in reducing the risk of forming abnormal blood clots that can lead to stroke. If you have the irregular heart rhythm of atrial fibrillation, you should be on a blood thinner unless there is a good reason you cannot take them.  Understand all your medicine instructions.  Make sure that other conditions (such as anemia or atherosclerosis) are addressed. SEEK IMMEDIATE MEDICAL CARE IF:   You have sudden weakness  or numbness of the face, arm, or leg, especially on one side of the body.  Your face or eyelid droops to one side.  You have sudden confusion.  You have trouble speaking (aphasia) or understanding.  You have sudden trouble seeing in one or both eyes.  You have sudden trouble  walking.  You have dizziness.  You have a loss of balance or coordination.  You have a sudden, severe headache with no known cause.  You have new chest pain or an irregular heartbeat. Any of these symptoms may represent a serious problem that is an emergency. Do not wait to see if the symptoms will go away. Get medical help at once. Call your local emergency services (911 in [place] Do not drive yourself to the hospital.   This information is not intended to replace advice given to you by your health care provider. Make sure you discuss any questions you have with your health care provider.   Document Released: 10/02/2004 Document Revised: 09/15/2014 Document Reviewed: 02/25/2013 Elsevier Interactive Patient Education [employer].

## 2016-01-01 NOTE — Telephone Encounter (Signed)
Gave and printed appt sched and avs for pt for Aug

## 2016-01-01 NOTE — Progress Notes (Signed)
Hematology and Oncology Follow Up Visit  Isaiah [name] [id number] 1966-07-10 50 y.o. 01/01/2016 10:37 AM    Principle Diagnosis: This is a 50 year old gentleman with the following diagnoses:   1. Monoclonal protein in the form of IgG kappa, likely represents monoclonal gammopathy, undetermined significance versus a smoldering myeloma. He presented with M spike about 1 g/dL and bone marrow biopsy showed 7% plasma cell involvement without evidence of myeloma in 12/2010. He did not have any lytic bone lesions.    Repeat a bone marrow biopsy in 10/16/2014 showed 13% plasma cell infiltration and no lytic bone lesions on his skeletal survey.  2. Multifactorial anemia: The etiology includes anemia of renal disease, plasma cell disorder, myeloproliferative disorder, and GI bleeding.   3. Long-standing renal failure and related to plasma cell disorder.  Interim History:  Isaiah [name] presents today for a followup visit with his wife. Since the last visit, he has been living in [place] February 2017. At that time he developed  CVA and sided weakness and speech difficulties. He was found to have a positive PFO and a TEE and was started on heparin and transferred to acute rehabilitation at [facility]. After a lengthy rehabilitation stay, he was discharged and currently receiving hemodialysis via venous access catheter and recently had a shunt placed.  Given his history of recurrent thrombosis episodes, he is currently anticoagulated with Coumadin long-term.  Clinically, he is recovering slowly at this time and his speech is improving. He is ambulating with the help of a cane at times without any help. He denied any falls or syncope. He still have some right-sided weakness but seems to be improving.    He does not report any headaches, blurry vision, syncope or seizures. He does not report any  palpitation, orthopnea or leg edema. Does not report any cough or hemoptysis or hematemesis.  He does not report any nausea, vomiting. He does not report any frequency, urgency or hesitancy. He does not report any skeletal complaints of arthralgias myalgias or pathological fractures. He does not report any opportunistic infections or recurrent infections. Rest of his review of systems unremarkable.   Medications: I have reviewed the patient's current medications.  Current outpatient prescriptions:  .  aspirin EC 81 MG tablet, Take 1 tablet (81 mg total) by mouth daily., Disp: 100 tablet, Rfl: 0 .  cetirizine (ZYRTEC) 10 MG tablet, Take 10 mg by mouth as needed for allergies. , Disp: , Rfl:  .  epoetin alfa (EPOGEN,PROCRIT) 35329 UNIT/ML injection, Inject 5,000 Units into the skin once a week. , Disp: , Rfl:  .  fluticasone furoate-vilanterol (BREO ELLIPTA) 100-25 MCG/INH AEPB, Inhale 1 puff into the lungs daily., Disp: 60 each, Rfl: 1 .  Insulin Glargine (LANTUS) 100 UNIT/ML Solostar Pen, Inject 25 Units into the skin daily at 10 pm., Disp: 15 mL, Rfl: 1 .  midodrine (PROAMATINE) 2.5 MG tablet, Take 1 tablet (2.5 mg total) by mouth 2 (two) times daily with a meal., Disp: 60 tablet, Rfl: 1 .  multivitamin (RENA-VIT) TABS tablet, Take 1 tablet by mouth at bedtime., Disp: 30 tablet, Rfl: 1 .  NEEDLE, DISP, 27 G (BD DISP NEEDLES) 27G X 1/2" MISC, 1 application by Does not apply route daily after supper., Disp: 100 each, Rfl: 0 .  pantoprazole (PROTONIX) 40 MG tablet, Take 1 tablet (40 mg total) by mouth 2 (two) times daily., Disp: 60 tablet, Rfl: 1 .  SENSIPAR 60 MG tablet, Take 60 mg by mouth daily.,  Disp: , Rfl: 10 .  traMADol (ULTRAM) 50 MG tablet, Take 1 tablet (50 mg total) by mouth every 6 (six) hours as needed for moderate pain., Disp: 30 tablet, Rfl: 0 .  warfarin (COUMADIN) 5 MG tablet, Take 1.5 tablets (7.5 mg total) by mouth daily at 6 PM., Disp: 45 tablet, Rfl: 1  Allergies:  Allergies  Allergen Reactions  . Codeine Rash and Other (See Comments)    Unknown reaction (patient says  it was more serious than just a rash, but he can't remember what happened)    Past Medical History, Surgical history, Social history, and Family History were reviewed and updated.  Physical Exam: Blood pressure 109/69, pulse 93, temperature 98.4 F (36.9 C), temperature source Oral, resp. rate 18, height _0  (1.778 m), weight 203 lb 12.8 oz (92.443 kg), SpO2 100 %. ECOG: 1 General appearance: Alert, awake gentleman without distress. His speech is adequate. Head: Normocephalic, without obvious abnormality no oral thrush noted. Neck: no adenopathy Lymph nodes: Cervical, supraclavicular, and axillary nodes normal. Heart:regular rate and rhythm, S1, S2 normal, no murmur, click, rub or gallop Lung:chest clear, no wheezing, rales, normal symmetric air entry. No dullness to percussion. Abdomin: soft, non-tender, without masses or organomegaly. No no rebound or guarding. EXT:no erythema, induration, or nodules Right-sided weakness in his upper and lower extremity noted.  Lab Results: Lab Results  Component Value Date   WBC 14.4* 12/10/2015   HGB 9.0* 12/10/2015   HCT 25.2* 12/10/2015   MCV 77.8* 12/10/2015   PLT 409* 12/10/2015     Chemistry      Component Value Date/Time   NA 132* 12/10/2015 0850   NA 138 09/06/2015 1306   K 4.1 12/10/2015 0850   K 4.8 09/06/2015 1306   CL 99* 12/10/2015 0850   CO2 22 12/10/2015 0850   CO2 15* 09/06/2015 1306   BUN 38* 12/10/2015 0850   BUN 54.0* 09/06/2015 1306   CREATININE 7.43* 12/10/2015 0850   CREATININE 5.8* 09/06/2015 1306      Component Value Date/Time   CALCIUM 8.9 12/10/2015 0850   CALCIUM 7.6* 09/06/2015 1306   ALKPHOS 89 12/03/2015 1207   ALKPHOS 122 09/06/2015 1306   AST 13* 12/03/2015 1207   AST 11 09/06/2015 1306   ALT 11* 12/03/2015 1207   ALT 9 09/06/2015 1306   BILITOT 1.4* 12/03/2015 1207   BILITOT 1.28* 09/06/2015 1306       Results for HASSON, Isaiah [name] (MRN [id number]) as of 01/01/2016 09:52  Ref. Range  01/16/2012 11:17 09/27/2014 19:26 09/06/2015 13:06  IgG (Immunoglobin G), Serum Latest Ref Range: [phone] mg/dL 2630 (H)  2870 (H)     Impression and Plan: This is a 50 year old gentleman with the following issues:   1. Monoclonal gammopathy, IgG subtype representing monoclonal gammopathy of undetermined significance versus multiple myeloma. He had 2 bone marrow biopsies done which showed plasma cell disorder related between 7-13%. His IgG level continued to be relatively stable in the last 3 years around 2600-2800. His M spike still about 2 g/dL.  I do not think his renal failure is related to plasma cell disorder. There is no clear-cut end organ damage to suggest symptomatic myeloma. I do not believe his recurrent thrombosis is related to plasma cell disorder.  We will continue to monitor him closely and repeat his protein studies every 3 months basis and consider repeating bone marrow biopsy in the near future to follow his plasma cell evolution and his bone marrow. Anti-myeloma therapy would  be considered if he involves interactive myeloma with symptomatic disease.    2. Anemia, which is multifactorial and definitely has an element of hemoglobinopathy. He will be receiving growth factor support with hemodialysis.  3. Renal insufficiency: Likely related to long-standing hypertension and diabetes and less likely a plasma cell disorder. He is receiving hemodialysis at this time.  4. Recurrent thrombosis: Most recently developed a CVA likely from a paradoxical embolism. He is chronically anticoagulated with warfarin.  5. Follow-up: Will be in 3 months to follow-up on his clinical status.    [name], MD 4/25/201710:37 AM

## 2016-01-02 LAB — KAPPA/LAMBDA LIGHT CHAINS
Ig Kappa Free Light Chain: 388.36 mg/L — ABNORMAL HIGH (ref 3.30–19.40)
Ig Lambda Free Light Chain: 89.62 mg/L — ABNORMAL HIGH (ref 5.71–26.30)
Kappa/Lambda FluidC Ratio: 4.33 — ABNORMAL HIGH (ref 0.26–1.65)

## 2016-01-02 MED FILL — BACLOFEN 10 MG TABLET: 10 | 14 days supply | Qty: 30 | Fill #0

## 2016-01-02 NOTE — Progress Notes (Signed)
[facility] [address] [place]. [place] [address] [phone]       OFFICE FOLLOW-UP NOTE  Isaiah [name] Date of Birth:  1966/08/08 Medical Record Number:  [id number]   HPI: Isaiah [name] is a 50 year old African-American gentleman seen today for first office follow-up visit following [name] consultation with Dr. Erlinda [name] in Oct 23, 2015. He is accompanied by his wife. He has a complicated PMH of DM type 2 with diabetic neuropathy, Sickle cell-thalassemia,  DVT and PE x 2 in 07/2011 and 1/ 2016 off coumadin due to GIB, OSA, CKD stage V, MGUS and anemia .He was recently admitted to [name] [place] on 09/16/15 with N/V and blood stools with acute on chronic renal failure due to acute pancreatitis. He was treated with supportive care, IV bicarb as well as multiple units PRBC and EGD with severe stage IV erosive esophagitis with gastritis.HD initiated on 01/09 due to poor recover with decrease in UOP. He developed hypoxic respiratory failure and was found to have RUL/RML PE as well as left popliteal DVT. He was treated with IV heparin. IVC filter was placed around 09/21/15 and post procedure was found to have right hand weakness. CT head on 09/26/15 reported possible subacut right MCA/PCA and left cerebellar infarcts which were not correlate to his right hand weakness. Neurology was consulted for input and EMG showed evidence of profound neuropathy. He had worsening of symptoms with increase in right sided weakness, facial droop speech difficulty on 09/27/15. MRI on 09/29/15 showedscattered right MCA and left MCA, left thalamus and b/l cerebellar infarcts. The infarcts on CT 09/26/15 likely to be acute too. On 09/30/15, he had worsening of aphasia and repeat MRI showed extension of left MCA frontal infarcts. MRA head showed left M2 superior branch occlusion. Echo with positive bubble study and TEE positive for PFO. Hypercoagulation studies  reported to be negative. IV heparin was changed to Lovenox due to recurrent embolic strokes on heparin.  He was then transferred here to [facility] rehab on 10/12/15. He was reported to have small amount of blood in stool on admission. Continued HD for CKD after admission. Due to CKD on HD, his lovenox discontinued and put back on heparin IV. His hematologist Dr. Alen [name] was consulted for anticoagulation use. He commented "As far as long-term anticoagulation, I feel be reasonable to consider aspirin alone and hope that IVC filter will prevent any future embolic strokes from occurring. An alternative strategy is to consider warfarin and follow closely for signs or symptoms of bleeding." since admission, Patient made significant improvement on his aphasia and right side weakness, and has been able to walk with assistance and speak much more fluent. His H&H  remained stable and no obvious bleeding reported. Dr. Erlinda [name] recommended starting warfarin. Patient has tolerated warfarin well without any more episodes of bleeding and hematocrit has remained stable. He is currently participating in outpatient physical, occupational and speech therapy and making good improvement. His able to states speaks short sentences now with some word hesitancy. He still has significant weakness in his right hand and grip but is able to walk with a cane. His balance is good is had no major falls. He does complain of pain and stiffness in his legs and hand particularly at night at times he has to wake up because of discomfort. He is being followed in the rehabilitation clinic but currently is not taking any medications for spasticity. His sugars have all been under good control. ROS:  14 system review of systems is positive for weakness, gait difficulty, speech difficulty, and anemia  PMH:  Past Medical History  Diagnosis Date  . Diabetes mellitus without complication ([place])   . Sickle cell-thalassemia disease ([place])     a. Sickle cell trait.  .  Pulmonary embolism ([place]) 07/2011    a. Tx with Coumadin for 6 months (unknown cause per patient).  . Hyperkalemia 07/2011  . Acute renal failure ([place]) 07/2011  . Monoclonal gammopathy   . Sleep apnea     a. uses CPAP.  [name] Hypertension   . Gout   . Pulmonary embolism ([place]) 07/2011; 09/27/2014    a. Bilat PE 07/2011 - unclear cause, tx with 6 months Coumadin.;   . Monoclonal gammopathies   . Anemia   . Asthma   . OSA on CPAP   . History of recent blood transfusion 10/27/14    2 Units PRBC's  . ESRD (end stage renal disease) on dialysis ([place])   . Stroke [facility])     Social History:  Social History   Social History  . Marital Status: Married    Spouse Name: Isaiah [name]  . Number of Children: 1  . Years of Education: college   Occupational History  . [place] History Main Topics  . Smoking status: Never Smoker   . Smokeless tobacco: Never Used  . Alcohol Use: No  . Drug Use: No  . Sexual Activity: Yes   Other Topics Concern  . Not on file   Social History Narrative   ** Merged History Encounter **        Medications:   Current Outpatient Prescriptions on File Prior to Visit  Medication Sig Dispense Refill  . aspirin EC 81 MG tablet Take 1 tablet (81 mg total) by mouth daily. 100 tablet 0  . cetirizine (ZYRTEC) 10 MG tablet Take 10 mg by mouth as needed for allergies.     [name] epoetin alfa (EPOGEN,PROCRIT) 35465 UNIT/ML injection Inject 5,000 Units into the skin once a week.     . fluticasone furoate-vilanterol (BREO ELLIPTA) 100-25 MCG/INH AEPB Inhale 1 puff into the lungs daily. 60 each 1  . Insulin Glargine (LANTUS) 100 UNIT/ML Solostar Pen Inject 25 Units into the skin daily at 10 pm. 15 mL 1  . midodrine (PROAMATINE) 2.5 MG tablet Take 1 tablet (2.5 mg total) by mouth 2 (two) times daily with a meal. 60 tablet 1  . multivitamin (RENA-VIT) TABS tablet Take 1 tablet by mouth at bedtime. 30 tablet 1  . NEEDLE, DISP, 27 G (BD DISP NEEDLES) 27G X 1/2"  MISC 1 application by Does not apply route daily after supper. 100 each 0  . pantoprazole (PROTONIX) 40 MG tablet Take 1 tablet (40 mg total) by mouth 2 (two) times daily. 60 tablet 1  . traMADol (ULTRAM) 50 MG tablet Take 1 tablet (50 mg total) by mouth every 6 (six) hours as needed for moderate pain. 30 tablet 0  . warfarin (COUMADIN) 5 MG tablet Take 1.5 tablets (7.5 mg total) by mouth daily at 6 PM. 45 tablet 1   No current facility-administered medications on file prior to visit.    Allergies:   Allergies  Allergen Reactions  . Codeine Rash and Other (See Comments)    Unknown reaction (patient says it was more serious than just a rash, but he can't remember what happened)    Physical Exam General: well developed, well nourished, seated, in no evident  distress Head: head normocephalic and atraumatic.  Neck: supple with no carotid or supraclavicular bruits Cardiovascular: regular rate and rhythm, no murmurs Musculoskeletal: no deformity Skin:  no rash/petichiae Vascular:  Normal pulses all extremities Filed Vitals:   01/01/16 1613  BP: 110/75  Pulse: 99   Neurologic Exam Mental Status: Awake and fully alert. Oriented to place and time. Recent and remote memory intact. Attention span, concentration and fund of knowledge Is slightly impaired. Mood and affect appropriate. Moderate expressive aphasia with word finding difficulties and disfluency. Good comprehension, naming and repetition. Cranial Nerves: Fundoscopic exam reveals sharp disc margins. Pupils equal, briskly reactive to light. Extraocular movements full without nystagmus. Visual fields full to confrontation. Hearing intact. Moderate right lower facial weakness. Facial sensation intact. Face, tongue, palate moves normally and symmetrically.  Motor: Spastic right hemiparesis with 4/5 right shoulder and elbow strength with significant weakness of the right grip and intrinsic hand muscles. 4/5 right lower extremity strength with  spasticity and increased tone. Ankle dorsiflexors week. Sensory.:   touch ,pinprick .position and vibratory sensation slightly diminished on the right compared to the left.  Coordination: Impaired on the right side and normal on the left  Gait and Station: Arises from chair without difficulty. Spastic hemiplegic gait with dragging of the right foot and circumduction.  Reflexes: 1+ and symmetric. Toes downgoing.   NIHSS  6 Modified Rankin  3   ASSESSMENT: 50 year old African-American male with bilateral anterior and posterior circulation embolic infarcts in January 2017 secondary to paradoxical embolism from deep and thrombosis, pulmonary embolism and patent foramen ovale while he was off Coumadin due to GI bleeding with prolonged hospitalization in [place] [name] but was transferred here for rehabilitation he has residual expressive aphasia and   spastic right hemiparesis . Vascular risk factors of diabetes, hypertension, sleep apnea   PLAN: I had a long d/w patient and his wife about his recent strokes from paradoxical embolism, PFO, risk for recurrent stroke/TIAs, post stroke spasticity,personally independently reviewed imaging studies and stroke evaluation results and answered questions.Continue warfarin daily  for secondary stroke prevention given h/o recent DVT/PE and maintain strict control of hypertension with blood pressure goal below 130/90, diabetes with hemoglobin A1c goal below 6.5% and lipids with LDL cholesterol goal below 70 mg/dL. I also advised the patient to continue ongoing physical, occupational and speech therapy. This was a prolonged visit with extensive history taking, review of data, imaging studies, labs and medical decision making of high complexity Greater than 50% of time during this 50 minute visit was spent on counseling,explanation of diagnosis, planning of further management, discussion with patient and family and coordination of care about his stroke , post stroke  spasticity and stroke prevention  He was advised not to return to work until next follow-up visit  with me in 3 months or call earlier if necessary Antony Contras, MD  [facility] [address] [place], [place] [address]  Phone [phone] Fax [phone]    Note: This document was prepared with digital dictation and possible smart phrase technology. Any transcriptional errors that result from this process are unintentional

## 2016-01-03 LAB — MULTIPLE MYELOMA PANEL, SERUM
Albumin SerPl Elph-Mcnc: 3.8 g/dL (ref 2.9–4.4)
Albumin/Glob SerPl: 1 (ref 0.7–1.7)
Alpha 1: 0.4 g/dL (ref 0.0–0.4)
Alpha2 Glob SerPl Elph-Mcnc: 0.6 g/dL (ref 0.4–1.0)
B-Globulin SerPl Elph-Mcnc: 1 g/dL (ref 0.7–1.3)
Gamma Glob SerPl Elph-Mcnc: 2.2 g/dL — ABNORMAL HIGH (ref 0.4–1.8)
Globulin, Total: 4.2 g/dL — ABNORMAL HIGH (ref 2.2–3.9)
IgA, Qn, Serum: 114 mg/dL (ref 90–386)
IgG, Qn, Serum: 2206 mg/dL — ABNORMAL HIGH (ref 700–1600)
IgM, Qn, Serum: 31 mg/dL (ref 20–172)
M Protein SerPl Elph-Mcnc: 1.4 g/dL — ABNORMAL HIGH
Total Protein: 8 g/dL (ref 6.0–8.5)

## 2016-01-04 ENCOUNTER — Ambulatory Visit: Payer: BLUE CROSS/BLUE SHIELD

## 2016-01-04 ENCOUNTER — Encounter: Payer: Self-pay | Admitting: Physician Assistant

## 2016-01-04 ENCOUNTER — Ambulatory Visit: Payer: BLUE CROSS/BLUE SHIELD | Admitting: Occupational Therapy

## 2016-01-04 ENCOUNTER — Other Ambulatory Visit: Payer: BLUE CROSS/BLUE SHIELD

## 2016-01-04 DIAGNOSIS — R2689 Other abnormalities of gait and mobility: Secondary | ICD-10-CM

## 2016-01-04 DIAGNOSIS — I69351 Hemiplegia and hemiparesis following cerebral infarction affecting right dominant side: Secondary | ICD-10-CM

## 2016-01-04 DIAGNOSIS — G8191 Hemiplegia, unspecified affecting right dominant side: Secondary | ICD-10-CM

## 2016-01-04 DIAGNOSIS — R278 Other lack of coordination: Secondary | ICD-10-CM | POA: Diagnosis not present

## 2016-01-04 DIAGNOSIS — R471 Dysarthria and anarthria: Secondary | ICD-10-CM | POA: Diagnosis not present

## 2016-01-04 DIAGNOSIS — R482 Apraxia: Secondary | ICD-10-CM

## 2016-01-04 DIAGNOSIS — R279 Unspecified lack of coordination: Secondary | ICD-10-CM | POA: Diagnosis not present

## 2016-01-04 DIAGNOSIS — I69315 Cognitive social or emotional deficit following cerebral infarction: Secondary | ICD-10-CM | POA: Diagnosis not present

## 2016-01-04 DIAGNOSIS — R41841 Cognitive communication deficit: Secondary | ICD-10-CM

## 2016-01-04 NOTE — Therapy (Signed)
[facility] [address] [place], [place], [address] Phone: [phone]   Fax:  [phone]  Speech Language Pathology Treatment  Patient Details  Name: [name] MRN: [id number] Date of Birth: 05-27-1966 Referring Provider: Alysia Penna, MD  Encounter Date: 01/04/2016      End of Session - 01/04/16 0942    Visit Number 3   Number of Visits 15   Date for SLP Re-Evaluation 02/13/16   SLP Start Time 0846   SLP Stop Time  0930   SLP Time Calculation (min) 44 min   Activity Tolerance Patient tolerated treatment well      Past Medical History  Diagnosis Date  . Diabetes mellitus without complication ([place])   . Sickle cell-thalassemia disease ([place])     a. Sickle cell trait.  . Pulmonary embolism ([place]) 07/2011    a. Tx with Coumadin for 6 months (unknown cause per patient).  . Hyperkalemia 07/2011  . Acute renal failure ([place]) 07/2011  . Monoclonal gammopathy   . Sleep apnea     a. uses CPAP.  [name] Hypertension   . Gout   . Pulmonary embolism ([place]) 07/2011; 09/27/2014    a. Bilat PE 07/2011 - unclear cause, tx with 6 months Coumadin.;   . Monoclonal gammopathies   . Anemia   . Asthma   . OSA on CPAP   . History of recent blood transfusion 10/27/14    2 Units PRBC's  . ESRD (end stage renal disease) on dialysis ([place])   . Stroke [name])     Past Surgical History  Procedure Laterality Date  . Cholecystectomy    . Bone marrow biopsy    . Other surgical history      Retinal surgery  . Pars plana vitrectomy  02/17/2012    Procedure: PARS PLANA VITRECTOMY WITH 25 GAUGE;  Surgeon: Hayden Pedro, MD;  Location: [place] [facility];  Service: Ophthalmology;  Laterality: Right;  . Cholecystectomy  1990's?  . Eye surgery Right   . Insertion of dialysis catheter Right 10/16/2015    Procedure: INSERTION OF PALINDROME DIALYSIS CATHETER ;  Surgeon: Elam Dutch, MD;  Location: [place];  Service: Vascular;  Laterality: Right;  .  Av fistula placement Right 12/05/2015    Procedure: INSERTION OF ARTERIOVENOUS (AV) GORE-TEX GRAFT ARM;  Surgeon: Elam Dutch, MD;  Location: [facility];  Service: Vascular;  Laterality: Right;    There were no vitals filed for this visit.      Subjective Assessment - 01/04/16 0852    Subjective Pt presented SLP with 4- and 5-syllable word lists   Currently in Pain? No/denies               ADULT SLP TREATMENT - 01/04/16 0852    General Information   Behavior/Cognition Alert;Cooperative;Pleasant mood   Treatment Provided   Treatment provided Cognitive-Linquistic   Cognitive-Linquistic Treatment   Treatment focused on Cognition;Apraxia   Skilled Treatment (speech tx >30 minutes): SLP practiced his multisyllable word list with pt stressing labial rounding vowels and back vowels (/u/, /o/, "aw") as these were distorted with vowels that were not as low as the mentioned vowels.  With practice, pt was able to produce these vowels with approx 80% success with very slow rate. (cognitive communication therapy - 9 minutes): In tasks/situations appropriate for them, SLP had to cue pt to use memory strategy use during session; he did not think of using them spontaneously..   Assessment / Recommendations /  Plan   Plan Continue with current plan of care   Progression Toward Goals   Progression toward goals Progressing toward goals          SLP Education - 01/04/16 1059    Education provided Yes   Education Details memory strategies   Person(s) Educated Patient   Methods Explanation;Verbal cues   Comprehension Verbalized understanding;Returned demonstration;Need further instruction          SLP Short Term Goals - 01/04/16 0944    SLP SHORT TERM GOAL #1   Title pt will demo HEP for apraxia/motor plannning with independence over 3 sessions   Time 4   Period Weeks  or 8 visits, for all STGs   Status On-going   SLP SHORT TERM GOAL #2   Title pt will demo HEP for dysarthria with  independence over 3 sessions   Time 4   Period Weeks   Status On-going   SLP SHORT TERM GOAL #3   Title pt will demo speech 95% accurate in sentence-length responses with modified independence (compensations) over 3 sessions   Time 4   Period Weeks   Status On-going   SLP SHORT TERM GOAL #4   Title pt will provide 4 memory strategies to SLP   Time 4   Period Weeks   Status On-going          SLP Long Term Goals - 01/04/16 0945    SLP LONG TERM GOAL #1   Title pt will demo functional speech in 20 minutes mod complex/complex conversation with modified independnence   Time 8   Period Weeks  or 15 visits, for all LTGs   Status On-going   SLP LONG TERM GOAL #2   Title pt will demo HEP for speech-motor planning indpendently over 5 total sessions    Time 8   Period Weeks   Status On-going   SLP LONG TERM GOAL #3   Title pt will demo HEP for dysarthria independently over 5 total sessions   Time 8   Period Weeks   Status On-going   SLP LONG TERM GOAL #4   Title pt will use a memory strategy in 3 sessions   Time 8   Period Weeks   Status New          Plan - 01/04/16 7414    Clinical Impression Statement Pt cont with motor speech deficits apraxia and dysarthria. 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 Pt did not think to use strategy to compensate for dec'd memory in appropriate situations in which to use one. Pt is motivated for change.  Skilled ST would cont to benefit pt for incr'd independence and possible return to work.   Speech Therapy Frequency 2x / week   Duration --  8 weeks, or 15 sessions   Treatment/Interventions SLP instruction and feedback;Compensatory strategies;Internal/external aids;Oral motor exercises;Patient/family education;Functional tasks;Cueing hierarchy   Potential to Achieve Goals Good   Potential Considerations Severity of impairments   Consulted  and Agree with Plan of Care Patient      Patient will benefit from skilled therapeutic intervention in order to improve the following deficits and impairments:   Dysarthria and anarthria  Apraxia  Cognitive communication deficit    Problem List Patient Active Problem List   Diagnosis Date Noted  . ESRD (end stage renal disease) ([place]) 12/03/2015  . PFO (patent foramen ovale)  11/06/2015  . CVA (cerebral infarction)   . ESRD (end stage renal disease) on dialysis ([place])   . S/P dialysis catheter insertion ([place])   . Acute deep vein thrombosis (DVT) of popliteal vein of left lower extremity ([place])   . Leukocytosis   . Labile blood glucose   . Acute blood loss anemia   . Hemiparesis, aphasia, and dysphagia as late effects of cerebrovascular accident ([place]) 10/13/2015  . Gait disturbance, post-stroke 10/13/2015  . Embolic stroke ([place]) 31/42/7670  . Plasma cell disorder   . Anemia 10/31/2014  . Long term current use of anticoagulant therapy 10/31/2014  . Renal insufficiency 10/31/2014  . Iron deficiency anemia due to chronic blood loss 10/30/2014  . Encounter for therapeutic drug monitoring 10/11/2014  . Pulmonary embolus ([place]) 09/27/2014  . ESRD on dialysis ([place]) 09/27/2014  . MGUS (monoclonal gammopathy of unknown significance) 09/27/2014  . OSA on CPAP 09/27/2014  . Diabetic retinopathy of both eyes ([place]) 02/03/2012  . Sickle cell trait ([place]) 07/19/2011  . Morbid obesity ([place]) 07/19/2011  . PE (pulmonary embolism) 07/18/2011  . SOB (shortness of breath) 07/18/2011  . Hyperkalemia 07/18/2011  . ARF (acute renal failure) ([place]) 07/18/2011  . Leukocytosis, unspecified 07/18/2011  . DM (diabetes mellitus) type II controlled with renal manifestation ([place]) 07/18/2011  . Cardiac enzymes elevated 07/18/2011  . Metabolic acidosis 11/00/3496  . HTN (hypertension) 07/18/2011    [facility], [place]  01/04/2016, 11:00 AM  [facility] [address] [place], [place], [address] Phone: [phone]   Fax:  [phone]   Name: [name] MRN: [id number] Date of Birth: May 25, 1966

## 2016-01-04 NOTE — Patient Instructions (Signed)
Bring your word list next time, so we can focus on those specific phrases/words that you need to target.

## 2016-01-04 NOTE — Patient Instructions (Signed)
Stroke Prevention Some medical conditions and behaviors are associated with an increased chance of having a stroke. You may prevent a stroke by making healthy choices and managing medical conditions. HOW CAN I REDUCE MY RISK OF HAVING A STROKE?   Stay physically active. Get at least 30 minutes of activity on most or all days.  Do not smoke. It may also be helpful to avoid exposure to secondhand smoke.  Limit alcohol use. Moderate alcohol use is considered to be:  No more than 2 drinks per day for men.  No more than 1 drink per day for nonpregnant women.  Eat healthy foods. This involves:  Eating 5 or more servings of fruits and vegetables a day.  Making dietary changes that address high blood pressure (hypertension), high cholesterol, diabetes, or obesity.  Manage your cholesterol levels.  Making food choices that are high in fiber and low in saturated fat, trans fat, and cholesterol may control cholesterol levels.  Take any prescribed medicines to control cholesterol as directed by your health care provider.  Manage your diabetes.  Controlling your carbohydrate and sugar intake is recommended to manage diabetes.  Take any prescribed medicines to control diabetes as directed by your health care provider.  Control your hypertension.  Making food choices that are low in salt (sodium), saturated fat, trans fat, and cholesterol is recommended to manage hypertension.  Ask your health care provider if you need treatment to lower your blood pressure. Take any prescribed medicines to control hypertension as directed by your health care provider.  If you are 18-39 years of age, have your blood pressure checked every 3-5 years. If you are 40 years of age or older, have your blood pressure checked every year.  Maintain a healthy weight.  Reducing calorie intake and making food choices that are low in sodium, saturated fat, trans fat, and cholesterol are recommended to manage  weight.  Stop drug abuse.  Avoid taking birth control pills.  Talk to your health care provider about the risks of taking birth control pills if you are over 35 years old, smoke, get migraines, or have ever had a blood clot.  Get evaluated for sleep disorders (sleep apnea).  Talk to your health care provider about getting a sleep evaluation if you snore a lot or have excessive sleepiness.  Take medicines only as directed by your health care provider.  For some people, aspirin or blood thinners (anticoagulants) are helpful in reducing the risk of forming abnormal blood clots that can lead to stroke. If you have the irregular heart rhythm of atrial fibrillation, you should be on a blood thinner unless there is a good reason you cannot take them.  Understand all your medicine instructions.  Make sure that other conditions (such as anemia or atherosclerosis) are addressed. SEEK IMMEDIATE MEDICAL CARE IF:   You have sudden weakness or numbness of the face, arm, or leg, especially on one side of the body.  Your face or eyelid droops to one side.  You have sudden confusion.  You have trouble speaking (aphasia) or understanding.  You have sudden trouble seeing in one or both eyes.  You have sudden trouble walking.  You have dizziness.  You have a loss of balance or coordination.  You have a sudden, severe headache with no known cause.  You have new chest pain or an irregular heartbeat. Any of these symptoms may represent a serious problem that is an emergency. Do not wait to see if the symptoms will   go away. Get medical help at once. Call your local emergency services (911 in [place] Do not drive yourself to the hospital.   This information is not intended to replace advice given to you by your health care provider. Make sure you discuss any questions you have with your health care provider.   Document Released: 10/02/2004 Document Revised: 09/15/2014 Document Reviewed:  02/25/2013 Elsevier Interactive Patient Education 2016 Elsevier Inc.  

## 2016-01-04 NOTE — Therapy (Signed)
[facility] [address] [place], [place], [address] Phone: [phone]   Fax:  [phone]  Occupational Therapy Treatment  Patient Details  Name: Isaiah [name] [facility] MRN: [id number] Date of Birth: [birth year] Referring Provider: Letta Pate  Encounter Date: 01/04/2016    Past Medical History  Diagnosis Date  . Diabetes mellitus without complication ([place])   . Sickle cell-thalassemia disease ([place])     a. Sickle cell trait.  . Pulmonary embolism ([place]) 07/2011    a. Tx with Coumadin for 6 months (unknown cause per patient).  . Hyperkalemia 07/2011  . Acute renal failure ([place]) 07/2011  . Monoclonal gammopathy   . Sleep apnea     a. uses CPAP.  [name] Hypertension   . Gout   . Pulmonary embolism ([place]) 07/2011; 09/27/2014    a. Bilat PE 07/2011 - unclear cause, tx with 6 months Coumadin.;   . Monoclonal gammopathies   . Anemia   . Asthma   . OSA on CPAP   . History of recent blood transfusion 10/27/14    2 Units PRBC's  . ESRD (end stage renal disease) on dialysis ([place])   . Stroke [name])     Past Surgical History  Procedure Laterality Date  . Cholecystectomy    . Bone marrow biopsy    . Other surgical history      Retinal surgery  . Pars plana vitrectomy  02/17/2012    Procedure: PARS PLANA VITRECTOMY WITH 25 GAUGE;  Surgeon: Hayden Pedro, MD;  Location: [facility];  Service: Ophthalmology;  Laterality: Right;  . Cholecystectomy  1990's?  . Eye surgery Right   . Insertion of dialysis catheter Right 10/16/2015    Procedure: INSERTION OF PALINDROME DIALYSIS CATHETER ;  Surgeon: Elam Dutch, MD;  Location: [facility];  Service: Vascular;  Laterality: Right;  . Av fistula placement Right 12/05/2015    Procedure: INSERTION OF ARTERIOVENOUS (AV) GORE-TEX GRAFT ARM;  Surgeon: Elam Dutch, MD;  Location: [facility];  Service: Vascular;  Laterality: Right;    There were no vitals filed for this visit.      Subjective Assessment -  01/04/16 0814    Pertinent History see Epic    Patient Stated Goals to use right hand better   Currently in Pain? No/denies          Reviewed activities from coordination HEP: flipping playing cards with fingers and then sliding off dek with thumb,  Turning nuts and bolt, picking up coins to place in a container,  mod-max difficulty and increased time for all tasks and mod v.c. Pt improves with repetition.   Placing large and medium pegs in semicircle with RUE, mod v.c, / mod difficulty, pt requires v.c. To avoid compensation and extend fingers.                     OT Short Term Goals - 12/14/15 1622    OT SHORT TERM GOAL #1   Title I with initiall HEP   Time 4   Period Weeks   Status New   OT SHORT TERM GOAL #2   Title Pt will perform all basic ADLS with supervision.   Time 4   Period Weeks   Status New   OT SHORT TERM GOAL #3   Title Pt will demonstrate improved RUE functional use as evidenced by performing 15 blocks on box/ blocks.   Time 4   Period Weeks   Status New  OT SHORT TERM GOAL #4   Title Pt will demonstrate ability to retrieve a light weight object at 115 shoulder flexion with RUE.   Time 4   Period Weeks   Status New   OT SHORT TERM GOAL #5   Title Further assess vision and set goal PRN.   Time 4   Period Weeks   Status New   Additional Short Term Goals   Additional Short Term Goals Yes   OT SHORT TERM GOAL #6   Title Pt will report feeding himself with RUE 50% of the time.   Time 4   Period Weeks   Status New           OT Long Term Goals - 12/14/15 1624    OT LONG TERM GOAL #1   Title I with updated HEP.   Time 8   Period Weeks   Status New   OT LONG TERM GOAL #2   Title Pt will demonstrate ability to write his name legibily.   Time 8   Period Weeks   Status New   OT LONG TERM GOAL #3   Title Pt will use RUE as an active assist for ADLS at least 47% of the time with pain no greater than 3/10.   Time 8   Period Weeks    Status New   OT LONG TERM GOAL #4   Title Pt will demonstrate improved finge motor coordination as evidenced by placing 4 pegs in 9 hole peg test board in 2 mins or less.   Time 8   Period Weeks   Status New   OT LONG TERM GOAL #5   Title Pt will demonstrate RUE grip strength of 15 lbs for increased functional use.   Time 8   Period Weeks   Status New   Long Term Additional Goals   Additional Long Term Goals Yes   OT LONG TERM GOAL #6   Title Pt will perform all basic ADLs and simple meal prep modified independently.   Time 8   Period Weeks   Status New               Plan - 01/04/16 8295    Clinical Impression Statement Pt is progressing towards goals. He can benefit from continued functional use of RUE and reinforcement of normal movement patterns..      Patient will benefit from skilled therapeutic intervention in order to improve the following deficits and impairments:  Decreased coordination, Decreased range of motion, Impaired flexibility, Decreased endurance, Decreased safety awareness, Decreased activity tolerance, Decreased knowledge of precautions, Impaired tone, Pain, Decreased cognition, Decreased mobility, Decreased strength, Impaired vision/preception, Impaired UE functional use, Decreased balance  Visit Diagnosis: Hemiplegia and hemiparesis following cerebral infarction affecting right dominant side (HCC)  Other lack of coordination    Problem List Patient Active Problem List   Diagnosis Date Noted  . ESRD (end stage renal disease) ([place]) 12/03/2015  . PFO (patent foramen ovale) 11/06/2015  . CVA (cerebral infarction)   . ESRD (end stage renal disease) on dialysis ([place])   . S/P dialysis catheter insertion ([place])   . Acute deep vein thrombosis (DVT) of popliteal vein of left lower extremity ([place])   . Leukocytosis   . Labile blood glucose   . Acute blood loss anemia   . Hemiparesis, aphasia, and dysphagia as late effects of cerebrovascular accident  ([place]) 10/13/2015  . Gait disturbance, post-stroke 10/13/2015  . Embolic stroke ([place]) 62/13/0865  . Plasma cell  disorder   . Anemia 10/31/2014  . Long term current use of anticoagulant therapy 10/31/2014  . Renal insufficiency 10/31/2014  . Iron deficiency anemia due to chronic blood loss 10/30/2014  . Encounter for therapeutic drug monitoring 10/11/2014  . Pulmonary embolus ([place]) 09/27/2014  . ESRD on dialysis ([place]) 09/27/2014  . MGUS (monoclonal gammopathy of unknown significance) 09/27/2014  . OSA on CPAP 09/27/2014  . Diabetic retinopathy of both eyes ([place]) 02/03/2012  . Sickle cell trait ([place]) 07/19/2011  . Morbid obesity ([place]) 07/19/2011  . PE (pulmonary embolism) 07/18/2011  . SOB (shortness of breath) 07/18/2011  . Hyperkalemia 07/18/2011  . ARF (acute renal failure) ([place]) 07/18/2011  . Leukocytosis, unspecified 07/18/2011  . DM (diabetes mellitus) type II controlled with renal manifestation ([place]) 07/18/2011  . Cardiac enzymes elevated 07/18/2011  . Metabolic acidosis 43/60/1658  . HTN (hypertension) 07/18/2011    RINE,KATHRYN 01/04/2016, 12:31 PM Theone Murdoch, OTR/L [phone] Phone: [phone] 12:31 PM 01/04/2016 [facility] [address] [place], [place], [address] Phone: [phone]   Fax:  [phone]  Name: Ausar Georgiou [facility] MRN: [id number] Date of Birth: 1965/12/10

## 2016-01-04 NOTE — Addendum Note (Signed)
Addended by: Garald Balding B on: 01/04/2016 09:55 AM   Modules accepted: Orders

## 2016-01-04 NOTE — Progress Notes (Unsigned)
Patient ID: Isaiah [name], male   DOB: 1966-07-01, 50 y.o.   MRN: [id number]   Pt was prescribed Tramadol 37m q6h prn pain at discharge on 12/10/15.  He did not receive the prescription at discharge as [place].   RLeontine Locket4/28/2017 2:33 PM

## 2016-01-04 NOTE — Therapy (Signed)
[facility] [address] [name], [place], [address] Phone: [phone]   Fax:  [phone]  Physical Therapy Treatment  Patient Details  Name: Isaiah [name] [facility] MRN: [id number] Date of Birth: 09/02/1966 Referring Provider: Dr. Letta Pate  Encounter Date: 01/04/2016      PT End of Session - 01/04/16 1302    Visit Number 3   Number of Visits 17   Date for PT Re-Evaluation 02/12/16   Authorization Type BCBS   PT Start Time 1103   PT Stop Time 1144   PT Time Calculation (min) 41 min   Equipment Utilized During Treatment --  Balance Master harness and min guard   Activity Tolerance Patient tolerated treatment well   Behavior During Therapy WFL for tasks assessed/performed      Past Medical History  Diagnosis Date  . Diabetes mellitus without complication ([place])   . Sickle cell-thalassemia disease ([place])     a. Sickle cell trait.  . Pulmonary embolism ([place]) 07/2011    a. Tx with Coumadin for 6 months (unknown cause per patient).  . Hyperkalemia 07/2011  . Acute renal failure ([place]) 07/2011  . Monoclonal gammopathy   . Sleep apnea     a. uses CPAP.  [name] Hypertension   . Gout   . Pulmonary embolism ([place]) 07/2011; 09/27/2014    a. Bilat PE 07/2011 - unclear cause, tx with 6 months Coumadin.;   . Monoclonal gammopathies   . Anemia   . Asthma   . OSA on CPAP   . History of recent blood transfusion 10/27/14    2 Units PRBC's  . ESRD (end stage renal disease) on dialysis ([place])   . Stroke [facility])     Past Surgical History  Procedure Laterality Date  . Cholecystectomy    . Bone marrow biopsy    . Other surgical history      Retinal surgery  . Pars plana vitrectomy  02/17/2012    Procedure: PARS PLANA VITRECTOMY WITH 25 GAUGE;  Surgeon: Hayden Pedro, MD;  Location: [facility];  Service: Ophthalmology;  Laterality: Right;  . Cholecystectomy  1990's?  . Eye surgery Right   . Insertion of dialysis catheter Right 10/16/2015     Procedure: INSERTION OF PALINDROME DIALYSIS CATHETER ;  Surgeon: Elam Dutch, MD;  Location: [facility];  Service: Vascular;  Laterality: Right;  . Av fistula placement Right 12/05/2015    Procedure: INSERTION OF ARTERIOVENOUS (AV) GORE-TEX GRAFT ARM;  Surgeon: Elam Dutch, MD;  Location: [facility];  Service: Vascular;  Laterality: Right;    There were no vitals filed for this visit.      Subjective Assessment - 01/04/16 1108    Subjective Pt denied falls or changes since last visit. Pt reported he would like to reduce frequency to 1x/week as insurance limits OT/PT visits to 20 combined.   Pertinent History DVT, PE-has IVC filter in placed, DM, CKD-stage V on dialysis, sickle cell anemia, OSA, asthma, HTN, tachycardia in hospital (12/06/15), AVF    Patient Stated Goals Climb stairs safely   Currently in Pain? No/denies             Neuro re-ed: sensory organization test performed with following results: Conditions: 1:  2 WNL, 1 below normal 2:  2 WNL, 1 below normal 3:  2 WNL, 1 below normal 4: 2 falls and 1 below normal 5:  All 3 falls 6:  All 3 falls Composite score:  34 (approx. 70 is  WNL for his age) Sensory Analysis Som: WNL Vis: Below normal (approx. 15) Vest: Below normal (0) Pref: WNL Strategy analysis:      Good ankle strategy but decr. Hip strategy during conditions 4-6. COG alignment:      Anterior with slight R sided bias               [facility] Adult PT Treatment/Exercise - 01/04/16 1113    Ambulation/Gait   Ambulation/Gait Yes   Ambulation/Gait Assistance 5: Supervision;4: Min guard   Ambulation/Gait Assistance Details Min guard without SPC otherwise S with SPC. Cues for sequencing.   Ambulation Distance (Feet) 75 Feet  x2 and 115'   Assistive device None;Straight cane   Gait Pattern Step-through pattern;Decreased stride length;Decreased dorsiflexion - right;Shuffle;Decreased hip/knee flexion - right   Ambulation Surface Level;Indoor   Stairs Yes    Stairs Assistance 4: Min guard   Stair Management Technique One rail Right;One rail Left;Alternating pattern;Step to pattern  step to to descend and alternating to ascend   Number of Stairs 4   Height of Stairs 6   Gait Comments Cues for wt. shifting and technique when traversing steps.                PT Education - 01/04/16 1302    Education provided Yes   Education Details PT discussed pt's options regarding PT/OT frequency as insurance limited to 20visits for PT/OT combined. Pt would prefer to decr. frequency to 1x/week for 8 weeks. PT will discuss with OT.   Person(s) Educated Patient   Methods Explanation   Comprehension Verbalized understanding          PT Short Term Goals - 01/04/16 1324    PT SHORT TERM GOAL #1   Title Pt will be IND in HEP to improve strength, balance, and safety. Target date: 01/11/16   Status On-going   PT SHORT TERM GOAL #2   Title Pt will improve BERG score to >/=48/56 to decr. falls risk. Target date: 01/11/16   Status On-going   PT SHORT TERM GOAL #3   Title Pt will improve gait speed to >/=2.44f/sec. without AD to amb. safely in the community. Target date: 01/11/16   Status On-going   PT SHORT TERM GOAL #4   Title Pt will amb. 300' over even/uneven terrain with LRAD at MOD I level. Target date: 01/11/16   Status On-going           PT Long Term Goals - 01/04/16 1325    PT LONG TERM GOAL #1   Title Pt will verbalize understanding of CVA risk factors and signs/symptoms to reduce risk of additional CVA. Target date: 02/08/16   Status On-going   PT LONG TERM GOAL #2   Title Pt will improve BERG score to >/=52/56 to decr. falls risk. Target date: 02/08/16   Status On-going   PT LONG TERM GOAL #3   Title Pt will amb. 200' over even terrain IND without AD to improve functional mobility. Target date: 02/08/16   Status On-going   PT LONG TERM GOAL #4   Title Pt will amb. 600' over even/uneven terrain IND without AD to improve functional moblity.  Target date: 02/08/16   Status On-going   PT LONG TERM GOAL #5   Title Assess stairs and write goal if necessary. Target date: 02/08/16   Baseline Pt will ascend/descend 12 steps with one handrail at MOD I level to traverse steps at home.    Status On-going   PT LONG  TERM GOAL #6   Title Pt will improve SOT composite score to WNL for his age (approx. 70) to improve balance and safety. Target date: 02/08/16   Status New               Plan - 01/04/16 1303    Clinical Impression Statement Pt's SOT score indicates he is below normal limits for his age group. Pt had difficulty maintaing balance during conditions which limited somatosensory input, and relied on vestibular and vision. Pt would continue to benefit from skilled PT to improve safety during functional mobility. PT reducing frequency to 1x/week per pt request, after next week.   Rehab Potential Good   Clinical Impairments Affecting Rehab Potential co-morbidities   PT Frequency 2x / week   PT Duration 8 weeks   PT Treatment/Interventions ADLs/Self Care Home Management;Biofeedback;Canalith Repostioning;Balance training;Therapeutic exercise;Vestibular;Manual techniques;Therapeutic activities;Functional mobility training;Stair training;Gait training;Orthotic Fit/Training;Patient/family education;DME Instruction;Cognitive remediation;Neuromuscular re-education   PT Next Visit Plan Review HEP for balance, hip ext/abd strengthening, and assess HR. Reduce to 1x/week after 01/11/16 visit.   Consulted and Agree with Plan of Care Patient;Family member/caregiver      Patient will benefit from skilled therapeutic intervention in order to improve the following deficits and impairments:  Abnormal gait, Decreased endurance, Cardiopulmonary status limiting activity, Postural dysfunction, Decreased safety awareness, Decreased coordination, Decreased cognition, Decreased balance, Decreased mobility, Decreased knowledge of use of DME, Decreased  strength  Visit Diagnosis: Other abnormalities of gait and mobility  Hemiplegia, unspecified affecting right dominant side [facility])     Problem List Patient Active Problem List   Diagnosis Date Noted  . ESRD (end stage renal disease) ([place]) 12/03/2015  . PFO (patent foramen ovale) 11/06/2015  . CVA (cerebral infarction)   . ESRD (end stage renal disease) on dialysis ([place])   . S/P dialysis catheter insertion ([place])   . Acute deep vein thrombosis (DVT) of popliteal vein of left lower extremity ([place])   . Leukocytosis   . Labile blood glucose   . Acute blood loss anemia   . Hemiparesis, aphasia, and dysphagia as late effects of cerebrovascular accident ([place]) 10/13/2015  . Gait disturbance, post-stroke 10/13/2015  . Embolic stroke ([place]) 78/29/5621  . Plasma cell disorder   . Anemia 10/31/2014  . Long term current use of anticoagulant therapy 10/31/2014  . Renal insufficiency 10/31/2014  . Iron deficiency anemia due to chronic blood loss 10/30/2014  . Encounter for therapeutic drug monitoring 10/11/2014  . Pulmonary embolus ([place]) 09/27/2014  . ESRD on dialysis ([place]) 09/27/2014  . MGUS (monoclonal gammopathy of unknown significance) 09/27/2014  . OSA on CPAP 09/27/2014  . Diabetic retinopathy of both eyes ([place]) 02/03/2012  . Sickle cell trait ([place]) 07/19/2011  . Morbid obesity ([place]) 07/19/2011  . PE (pulmonary embolism) 07/18/2011  . SOB (shortness of breath) 07/18/2011  . Hyperkalemia 07/18/2011  . ARF (acute renal failure) ([place]) 07/18/2011  . Leukocytosis, unspecified 07/18/2011  . DM (diabetes mellitus) type II controlled with renal manifestation ([place]) 07/18/2011  . Cardiac enzymes elevated 07/18/2011  . Metabolic acidosis 30/86/5784  . HTN (hypertension) 07/18/2011    Zacharee Gaddie L 01/04/2016, 1:26 PM  [facility] [address] [place], [place], [address] Phone: [phone]   Fax:  [phone]  Name:  [facility] MRN: [id number] Date of Birth: [birth year]    Geoffry Paradise, PT,DPT 01/04/2016 1:26 PM Phone: [phone] Fax: [phone]

## 2016-01-06 ENCOUNTER — Encounter (HOSPITAL_COMMUNITY): Payer: Self-pay

## 2016-01-06 ENCOUNTER — Inpatient Hospital Stay (HOSPITAL_COMMUNITY)
Admission: EM | Admit: 2016-01-06 | Discharge: 2016-01-11 | DRG: 100 | Disposition: A | Discharge status: Home Health | Payer: BLUE CROSS/BLUE SHIELD | Attending: Internal Medicine | Admitting: Internal Medicine

## 2016-01-06 ENCOUNTER — Emergency Department (HOSPITAL_COMMUNITY): Payer: BLUE CROSS/BLUE SHIELD

## 2016-01-06 ENCOUNTER — Observation Stay (HOSPITAL_COMMUNITY): Payer: BLUE CROSS/BLUE SHIELD

## 2016-01-06 DIAGNOSIS — Q211 Atrial septal defect: Secondary | ICD-10-CM

## 2016-01-06 DIAGNOSIS — D638 Anemia in other chronic diseases classified elsewhere: Secondary | ICD-10-CM | POA: Diagnosis not present

## 2016-01-06 DIAGNOSIS — Z86718 Personal history of other venous thrombosis and embolism: Secondary | ICD-10-CM

## 2016-01-06 DIAGNOSIS — E1122 Type 2 diabetes mellitus with diabetic chronic kidney disease: Secondary | ICD-10-CM | POA: Diagnosis not present

## 2016-01-06 DIAGNOSIS — I4819 Other persistent atrial fibrillation: Secondary | ICD-10-CM

## 2016-01-06 DIAGNOSIS — G9340 Encephalopathy, unspecified: Secondary | ICD-10-CM | POA: Diagnosis present

## 2016-01-06 DIAGNOSIS — N2581 Secondary hyperparathyroidism of renal origin: Secondary | ICD-10-CM | POA: Diagnosis present

## 2016-01-06 DIAGNOSIS — G4089 Other seizures: Secondary | ICD-10-CM | POA: Diagnosis not present

## 2016-01-06 DIAGNOSIS — Z794 Long term (current) use of insulin: Secondary | ICD-10-CM

## 2016-01-06 DIAGNOSIS — S01512A Laceration without foreign body of oral cavity, initial encounter: Secondary | ICD-10-CM | POA: Diagnosis present

## 2016-01-06 DIAGNOSIS — I4891 Unspecified atrial fibrillation: Secondary | ICD-10-CM | POA: Diagnosis present

## 2016-01-06 DIAGNOSIS — I482 Chronic atrial fibrillation: Secondary | ICD-10-CM | POA: Diagnosis not present

## 2016-01-06 DIAGNOSIS — G4733 Obstructive sleep apnea (adult) (pediatric): Secondary | ICD-10-CM | POA: Diagnosis present

## 2016-01-06 DIAGNOSIS — R29810 Facial weakness: Secondary | ICD-10-CM | POA: Diagnosis present

## 2016-01-06 DIAGNOSIS — Z6828 Body mass index (BMI) 28.0-28.9, adult: Secondary | ICD-10-CM

## 2016-01-06 DIAGNOSIS — E11319 Type 2 diabetes mellitus with unspecified diabetic retinopathy without macular edema: Secondary | ICD-10-CM | POA: Diagnosis present

## 2016-01-06 DIAGNOSIS — I5042 Chronic combined systolic (congestive) and diastolic (congestive) heart failure: Secondary | ICD-10-CM | POA: Diagnosis not present

## 2016-01-06 DIAGNOSIS — D631 Anemia in chronic kidney disease: Secondary | ICD-10-CM | POA: Diagnosis present

## 2016-01-06 DIAGNOSIS — Z7982 Long term (current) use of aspirin: Secondary | ICD-10-CM

## 2016-01-06 DIAGNOSIS — N186 End stage renal disease: Secondary | ICD-10-CM | POA: Diagnosis present

## 2016-01-06 DIAGNOSIS — E876 Hypokalemia: Secondary | ICD-10-CM | POA: Diagnosis not present

## 2016-01-06 DIAGNOSIS — I110 Hypertensive heart disease with heart failure: Secondary | ICD-10-CM | POA: Diagnosis not present

## 2016-01-06 DIAGNOSIS — E1121 Type 2 diabetes mellitus with diabetic nephropathy: Secondary | ICD-10-CM | POA: Diagnosis present

## 2016-01-06 DIAGNOSIS — Z8673 Personal history of transient ischemic attack (TIA), and cerebral infarction without residual deficits: Secondary | ICD-10-CM

## 2016-01-06 DIAGNOSIS — I959 Hypotension, unspecified: Secondary | ICD-10-CM | POA: Diagnosis not present

## 2016-01-06 DIAGNOSIS — D72829 Elevated white blood cell count, unspecified: Secondary | ICD-10-CM | POA: Diagnosis present

## 2016-01-06 DIAGNOSIS — I10 Essential (primary) hypertension: Secondary | ICD-10-CM | POA: Diagnosis not present

## 2016-01-06 DIAGNOSIS — G9341 Metabolic encephalopathy: Secondary | ICD-10-CM | POA: Diagnosis not present

## 2016-01-06 DIAGNOSIS — R569 Unspecified convulsions: Secondary | ICD-10-CM | POA: Diagnosis not present

## 2016-01-06 DIAGNOSIS — I69320 Aphasia following cerebral infarction: Secondary | ICD-10-CM | POA: Diagnosis not present

## 2016-01-06 DIAGNOSIS — Z992 Dependence on renal dialysis: Secondary | ICD-10-CM

## 2016-01-06 DIAGNOSIS — Z885 Allergy status to narcotic agent status: Secondary | ICD-10-CM

## 2016-01-06 DIAGNOSIS — D5740 Sickle-cell thalassemia without crisis: Secondary | ICD-10-CM | POA: Diagnosis present

## 2016-01-06 DIAGNOSIS — I69351 Hemiplegia and hemiparesis following cerebral infarction affecting right dominant side: Secondary | ICD-10-CM

## 2016-01-06 DIAGNOSIS — R531 Weakness: Secondary | ICD-10-CM | POA: Diagnosis present

## 2016-01-06 DIAGNOSIS — R29818 Other symptoms and signs involving the nervous system: Secondary | ICD-10-CM | POA: Diagnosis not present

## 2016-01-06 DIAGNOSIS — Z7951 Long term (current) use of inhaled steroids: Secondary | ICD-10-CM

## 2016-01-06 DIAGNOSIS — Z833 Family history of diabetes mellitus: Secondary | ICD-10-CM

## 2016-01-06 DIAGNOSIS — Z841 Family history of disorders of kidney and ureter: Secondary | ICD-10-CM

## 2016-01-06 DIAGNOSIS — Z8249 Family history of ischemic heart disease and other diseases of the circulatory system: Secondary | ICD-10-CM

## 2016-01-06 DIAGNOSIS — Z7901 Long term (current) use of anticoagulants: Secondary | ICD-10-CM

## 2016-01-06 DIAGNOSIS — I639 Cerebral infarction, unspecified: Secondary | ICD-10-CM

## 2016-01-06 DIAGNOSIS — R4701 Aphasia: Secondary | ICD-10-CM

## 2016-01-06 DIAGNOSIS — M24541 Contracture, right hand: Secondary | ICD-10-CM | POA: Diagnosis present

## 2016-01-06 DIAGNOSIS — Z86711 Personal history of pulmonary embolism: Secondary | ICD-10-CM

## 2016-01-06 HISTORY — DX: Unspecified atrial fibrillation: I48.91

## 2016-01-06 LAB — I-STAT CHEM 8, ED
BUN: 36 mg/dL — ABNORMAL HIGH (ref 6–20)
Calcium, Ion: 0.79 mmol/L — ABNORMAL LOW (ref 1.12–1.23)
Chloride: 101 mmol/L (ref 101–111)
Creatinine, Ser: 7.3 mg/dL — ABNORMAL HIGH (ref 0.61–1.24)
Glucose, Bld: 181 mg/dL — ABNORMAL HIGH (ref 65–99)
HCT: 33 % — ABNORMAL LOW (ref 39.0–52.0)
Hemoglobin: 11.2 g/dL — ABNORMAL LOW (ref 13.0–17.0)
Potassium: 2.9 mmol/L — ABNORMAL LOW (ref 3.5–5.1)
Sodium: 139 mmol/L (ref 135–145)
TCO2: 16 mmol/L (ref 0–100)

## 2016-01-06 LAB — DIFFERENTIAL
Basophils Absolute: 0.2 10*3/uL — ABNORMAL HIGH (ref 0.0–0.1)
Basophils Relative: 1 %
Eosinophils Absolute: 0.4 10*3/uL (ref 0.0–0.7)
Eosinophils Relative: 2 %
Lymphocytes Relative: 24 %
Lymphs Abs: 4.9 10*3/uL — ABNORMAL HIGH (ref 0.7–4.0)
Monocytes Absolute: 1.6 10*3/uL — ABNORMAL HIGH (ref 0.1–1.0)
Monocytes Relative: 8 %
Neutro Abs: 13.3 10*3/uL — ABNORMAL HIGH (ref 1.7–7.7)
Neutrophils Relative %: 65 %

## 2016-01-06 LAB — CBC
HCT: 25.3 % — ABNORMAL LOW (ref 39.0–52.0)
Hemoglobin: 9.2 g/dL — ABNORMAL LOW (ref 13.0–17.0)
MCH: 29.1 pg (ref 26.0–34.0)
MCHC: 36.4 g/dL — ABNORMAL HIGH (ref 30.0–36.0)
MCV: 80.1 fL (ref 78.0–100.0)
Platelets: 420 10*3/uL — ABNORMAL HIGH (ref 150–400)
RBC: 3.16 MIL/uL — ABNORMAL LOW (ref 4.22–5.81)
RDW: 16.9 % — ABNORMAL HIGH (ref 11.5–15.5)
WBC: 20.4 10*3/uL — ABNORMAL HIGH (ref 4.0–10.5)

## 2016-01-06 LAB — COMPREHENSIVE METABOLIC PANEL
ALT: 14 U/L — ABNORMAL LOW (ref 17–63)
AST: 30 U/L (ref 15–41)
Albumin: 3.3 g/dL — ABNORMAL LOW (ref 3.5–5.0)
Alkaline Phosphatase: 113 U/L (ref 38–126)
Anion gap: 21 — ABNORMAL HIGH (ref 5–15)
BUN: 35 mg/dL — ABNORMAL HIGH (ref 6–20)
CO2: 15 mmol/L — ABNORMAL LOW (ref 22–32)
Calcium: 7.4 mg/dL — ABNORMAL LOW (ref 8.9–10.3)
Chloride: 102 mmol/L (ref 101–111)
Creatinine, Ser: 7.31 mg/dL — ABNORMAL HIGH (ref 0.61–1.24)
GFR calc Af Amer: 9 mL/min — ABNORMAL LOW (ref >60)
GFR calc non Af Amer: 8 mL/min — ABNORMAL LOW (ref >60)
Glucose, Bld: 192 mg/dL — ABNORMAL HIGH (ref 65–99)
Potassium: 2.9 mmol/L — ABNORMAL LOW (ref 3.5–5.1)
Sodium: 138 mmol/L (ref 135–145)
Total Bilirubin: 1.2 mg/dL (ref 0.3–1.2)
Total Protein: 8 g/dL (ref 6.5–8.1)

## 2016-01-06 LAB — I-STAT TROPONIN, ED: Troponin i, poc: 0.03 ng/mL (ref 0.00–0.08)

## 2016-01-06 LAB — GLUCOSE, CAPILLARY
Glucose-Capillary: 132 mg/dL — ABNORMAL HIGH (ref 65–99)
Glucose-Capillary: 103 mg/dL — ABNORMAL HIGH (ref 65–99)

## 2016-01-06 LAB — PROTIME-INR
INR: 2.53 — ABNORMAL HIGH (ref 0.00–1.49)
Prothrombin Time: 26.9 seconds — ABNORMAL HIGH (ref 11.6–15.2)

## 2016-01-06 LAB — ETHANOL: Alcohol, Ethyl (B): <5 mg/dL (ref <5)

## 2016-01-06 LAB — TSH: TSH: 0.896 u[IU]/mL (ref 0.350–4.500)

## 2016-01-06 LAB — APTT: aPTT: 41 seconds — ABNORMAL HIGH (ref 24–37)

## 2016-01-06 MED ORDER — INSULIN GLARGINE 100 UNIT/ML ~~LOC~~ SOLN
13.0000 [IU] | Freq: Every day | SUBCUTANEOUS | Status: DC
  Administered 2016-01-06 – 2016-01-07 (×2): 13 [IU] via SUBCUTANEOUS
  Filled 2016-01-06 (×2): qty 0.13

## 2016-01-06 MED ORDER — ACETAMINOPHEN 325 MG PO TABS: 650.0000 mg | ORAL_TABLET | Freq: Four times a day (QID) | ORAL | Status: DC | PRN

## 2016-01-06 MED ORDER — LORAZEPAM 2 MG/ML IJ SOLN: 0.5000 mg | INTRAMUSCULAR | Status: DC | PRN

## 2016-01-06 MED ORDER — SODIUM CHLORIDE 0.9 % IV SOLN
200.0000 mg | Freq: Once | INTRAVENOUS | Status: AC
  Administered 2016-01-06: 200 mg via INTRAVENOUS
  Filled 2016-01-06: qty 20

## 2016-01-06 MED ORDER — ONDANSETRON HCL 4 MG PO TABS: 4.0000 mg | ORAL_TABLET | Freq: Four times a day (QID) | ORAL | Status: DC | PRN

## 2016-01-06 MED ORDER — CINACALCET HCL 30 MG PO TABS
60.0000 mg | ORAL_TABLET | Freq: Every day | ORAL | Status: DC
  Administered 2016-01-06 – 2016-01-09 (×4): 60 mg via ORAL
  Filled 2016-01-06 (×5): qty 2

## 2016-01-06 MED ORDER — PANTOPRAZOLE SODIUM 40 MG PO TBEC
40.0000 mg | DELAYED_RELEASE_TABLET | Freq: Two times a day (BID) | ORAL | Status: DC
  Administered 2016-01-06 – 2016-01-11 (×11): 40 mg via ORAL
  Filled 2016-01-06 (×11): qty 1

## 2016-01-06 MED ORDER — WARFARIN SODIUM 7.5 MG PO TABS
7.5000 mg | ORAL_TABLET | Freq: Once | ORAL | Status: AC
  Administered 2016-01-06: 7.5 mg via ORAL
  Filled 2016-01-06 (×2): qty 1

## 2016-01-06 MED ORDER — INSULIN ASPART 100 UNIT/ML ~~LOC~~ SOLN: 0.0000 [IU] | Freq: Every day | SUBCUTANEOUS | Status: DC

## 2016-01-06 MED ORDER — INSULIN ASPART 100 UNIT/ML ~~LOC~~ SOLN
0.0000 [IU] | Freq: Three times a day (TID) | SUBCUTANEOUS | Status: DC
  Administered 2016-01-06: 1 [IU] via SUBCUTANEOUS
  Administered 2016-01-09: 2 [IU] via SUBCUTANEOUS
  Administered 2016-01-09: 1 [IU] via SUBCUTANEOUS
  Administered 2016-01-10: 3 [IU] via SUBCUTANEOUS
  Administered 2016-01-10: 1 [IU] via SUBCUTANEOUS
  Administered 2016-01-11: 2 [IU] via SUBCUTANEOUS

## 2016-01-06 MED ORDER — LORAZEPAM 2 MG/ML IJ SOLN
INTRAMUSCULAR | Status: AC
  Filled 2016-01-06: qty 1

## 2016-01-06 MED ORDER — SODIUM CHLORIDE 0.9% FLUSH
3.0000 mL | Freq: Two times a day (BID) | INTRAVENOUS | Status: DC
  Administered 2016-01-06 – 2016-01-11 (×8): 3 mL via INTRAVENOUS

## 2016-01-06 MED ORDER — FLUTICASONE FUROATE-VILANTEROL 100-25 MCG/INH IN AEPB
1.0000 | INHALATION_SPRAY | Freq: Every day | RESPIRATORY_TRACT | Status: DC
  Administered 2016-01-07 – 2016-01-11 (×5): 1 via RESPIRATORY_TRACT
  Filled 2016-01-06: qty 28

## 2016-01-06 MED ORDER — LORAZEPAM 2 MG/ML IJ SOLN
INTRAMUSCULAR | Status: AC
  Administered 2016-01-06: 12:00:00
  Filled 2016-01-06: qty 1

## 2016-01-06 MED ORDER — LACOSAMIDE 50 MG PO TABS
100.0000 mg | ORAL_TABLET | Freq: Two times a day (BID) | ORAL | Status: DC
  Administered 2016-01-06 – 2016-01-11 (×10): 100 mg via ORAL
  Filled 2016-01-06 (×10): qty 2

## 2016-01-06 MED ORDER — ASPIRIN EC 81 MG PO TBEC
81.0000 mg | DELAYED_RELEASE_TABLET | Freq: Every day | ORAL | Status: DC
Start: 2016-01-06 — End: 2016-01-11
  Administered 2016-01-06 – 2016-01-11 (×6): 81 mg via ORAL
  Filled 2016-01-06 (×6): qty 1

## 2016-01-06 MED ORDER — WARFARIN - PHARMACIST DOSING INPATIENT
Freq: Every day | Status: DC
  Administered 2016-01-06 – 2016-01-10 (×2)

## 2016-01-06 MED ORDER — BACLOFEN 10 MG PO TABS
10.0000 mg | ORAL_TABLET | Freq: Three times a day (TID) | ORAL | Status: DC
Start: 2016-01-06 — End: 2016-01-07
  Administered 2016-01-06 – 2016-01-07 (×3): 10 mg via ORAL
  Filled 2016-01-06 (×3): qty 1

## 2016-01-06 MED ORDER — ONDANSETRON HCL 4 MG/2ML IJ SOLN: 4.0000 mg | Freq: Four times a day (QID) | INTRAMUSCULAR | Status: DC | PRN

## 2016-01-06 MED ORDER — MIDODRINE HCL 5 MG PO TABS
2.5000 mg | ORAL_TABLET | Freq: Two times a day (BID) | ORAL | Status: DC
  Administered 2016-01-06 – 2016-01-11 (×9): 2.5 mg via ORAL
  Filled 2016-01-06 (×9): qty 1

## 2016-01-06 MED ORDER — HEPARIN SODIUM (PORCINE) 5000 UNIT/ML IJ SOLN: 5000.0000 [IU] | Freq: Three times a day (TID) | INTRAMUSCULAR | Status: DC

## 2016-01-06 MED ORDER — ACETAMINOPHEN 650 MG RE SUPP: 650.0000 mg | Freq: Four times a day (QID) | RECTAL | Status: DC | PRN

## 2016-01-06 NOTE — Consult Note (Addendum)
Neurology Consultation Reason for Consult: Seizure Referring Physician: Renne Crigler, C  CC: Seizure  History is obtained from: Wife  HPI: Isaiah [name] [facility] is a 50 y.o. male who was in his normal state of health until approximately 10:30 AM. Following this, around 10:37 AM he had a witnessed seizure characterized by shaking. After the seizure stopped, he continued to have right-sided weakness and aphasia much worse than what was following his typical stroke. He was brought into the emergency department as a code stroke.  On arrival he was aphasic with right hemiparesis, and having subtle clonic movements of the right leg. He was given Ativan following which the clonic movements of the right leg ceased and he began to talk.  LKW: 10:30 am  tpa given?: no,  theraputic INR    ROS:  Unable to obtain due to altered mental status.   Past Medical History  Diagnosis Date  . Diabetes mellitus without complication ([place])   . Sickle cell-thalassemia disease ([place])     a. Sickle cell trait.  . Pulmonary embolism ([place]) 07/2011    a. Tx with Coumadin for 6 months (unknown cause per patient).  . Hyperkalemia 07/2011  . Acute renal failure ([place]) 07/2011  . Monoclonal gammopathy   . Sleep apnea     a. uses CPAP.  [name] Hypertension   . Gout   . Pulmonary embolism ([place]) 07/2011; 09/27/2014    a. Bilat PE 07/2011 - unclear cause, tx with 6 months Coumadin.;   . Monoclonal gammopathies   . Anemia   . Asthma   . OSA on CPAP   . History of recent blood transfusion 10/27/14    2 Units PRBC's  . ESRD (end stage renal disease) on dialysis ([place])   . Stroke [facility])      Family History  Problem Relation Age of Onset  . Hypertension Mother   . Diabetes Mother   . Sickle cell anemia Brother   . Diabetes type I Brother   . Kidney disease Brother   . Stroke Father      Social History:  reports that he has never smoked. He has never used smokeless tobacco. He reports that he does not drink alcohol or use  illicit drugs.   Exam: Current vital signs: BP 125/84 mmHg  Pulse 113  Resp 18  Wt 92.987 kg (205 lb)  SpO2 95% Vital signs in last 24 hours: Pulse Rate:  [113] 113 (04/30 1155) Resp:  [18] 18 (04/30 1155) BP: (125)/(84) 125/84 mmHg (04/30 1155) SpO2:  [95 %] 95 % (04/30 1155) Weight:  [92.987 kg (205 lb)] 92.987 kg (205 lb) (04/30 1155)   Physical Exam  Constitutional: Appears well-developed and well-nourished.  Psych: Affect appropriate to situation Eyes: No scleral injection HENT: No OP obstrucion Head: Normocephalic.  Cardiovascular: Normal rate and regular rhythm.  Respiratory: Effort normal and breath sounds normal to anterior ascultation GI: Soft.  No distension. There is no tenderness.  Skin: WDI  Neuro: Mental Status: Patient is awake, alert, he is aphasic, saying only yes, no, or um and not appropriately. Cranial Nerves: II: Appears to blink to threat from the left, not clear from the right Pupils are equal, round, and reactive to light.   III,IV, VI: He appears to have a leftward gaze preference V: Facial sensation is decreased on the right VII: Facial movement is notable for right facial weakness  Motor: Tone is normal. Bulk is normal. 5/5 strength was present on the left, he has 2-3/5 weakness  of the right arm and 4/5 weakness of the right leg Sensory: Appears to respond less to noxious stimulation on the right than left Cerebellar: No clear ataxia on the left  Following administration of IV Ativan, his right foot clonic activity ceased and he began to speak, identifying his wife in answering simple questions.   I have reviewed labs in epic and the results pertinent to this consultation are: Elevated creatinine, low potassium  I have reviewed the images obtained: MRI brain-subtle diffusion change) the area of his previous hemorrhage, likely artifactual or postictal change  Impression: 50 year old male with a history of previous infarcts who presents  with new onset seizures.  Recommendations: 1) Vimpat, 200 mg 1 then 100 mg twice a day 2) neurology will continue to follow.   Roland Rack, MD [facility] [phone]  If 7pm- 7am, please page neurology on call as listed in [place].

## 2016-01-06 NOTE — ED Notes (Signed)
Code stroke canceled following MRI

## 2016-01-06 NOTE — ED Notes (Signed)
Attempted to call report, RN unavilable

## 2016-01-06 NOTE — ED Provider Notes (Addendum)
CSN: [id number]     Arrival date & time 01/06/16  1142 History   First MD Initiated Contact with Patient 01/06/16 1144     No chief complaint on file.    (Consider location/radiation/quality/duration/timing/severity/associated sxs/prior Treatment) HPI Comments: Patient here after having acute onset of right-sided weakness as well as trouble speaking. Last seen normal was this morning. He does have a prior history of CVA with some residual right-sided deficits. According to the wife, patient does have some dysarthria at baseline. He also needs assistance with walking. No recent headaches or vomiting. His symptoms have been progressively worse with some possible seizure activity. EMS was called and patient's blood sugar over 100 and patient transported here  The history is provided by the patient and the spouse. The history is limited by the condition of the patient.    Past Medical History  Diagnosis Date  . Diabetes mellitus without complication ([place])   . Sickle cell-thalassemia disease ([place])     a. Sickle cell trait.  . Pulmonary embolism ([place]) 07/2011    a. Tx with Coumadin for 6 months (unknown cause per patient).  . Hyperkalemia 07/2011  . Acute renal failure ([place]) 07/2011  . Monoclonal gammopathy   . Sleep apnea     a. uses CPAP.  [name] Hypertension   . Gout   . Pulmonary embolism ([place]) 07/2011; 09/27/2014    a. Bilat PE 07/2011 - unclear cause, tx with 6 months Coumadin.;   . Monoclonal gammopathies   . Anemia   . Asthma   . OSA on CPAP   . History of recent blood transfusion 10/27/14    2 Units PRBC's  . ESRD (end stage renal disease) on dialysis ([place])   . Stroke [facility])    Past Surgical History  Procedure Laterality Date  . Cholecystectomy    . Bone marrow biopsy    . Other surgical history      Retinal surgery  . Pars plana vitrectomy  02/17/2012    Procedure: PARS PLANA VITRECTOMY WITH 25 GAUGE;  Surgeon: Hayden Pedro, MD;  Location: [place];  Service: Ophthalmology;   Laterality: Right;  . Cholecystectomy  1990's?  . Eye surgery Right   . Insertion of dialysis catheter Right 10/16/2015    Procedure: INSERTION OF PALINDROME DIALYSIS CATHETER ;  Surgeon: Elam Dutch, MD;  Location: [place];  Service: Vascular;  Laterality: Right;  . Av fistula placement Right 12/05/2015    Procedure: INSERTION OF ARTERIOVENOUS (AV) GORE-TEX GRAFT ARM;  Surgeon: Elam Dutch, MD;  Location: [facility] OR;  Service: Vascular;  Laterality: Right;   Family History  Problem Relation Age of Onset  . Hypertension Mother   . Diabetes Mother   . Sickle cell anemia Brother   . Diabetes type I Brother   . Kidney disease Brother   . Stroke Father    Social History  Substance Use Topics  . Smoking status: Never Smoker   . Smokeless tobacco: Never Used  . Alcohol Use: No    Review of Systems  Unable to perform ROS: Acuity of condition      Allergies  Codeine  Home Medications   Prior to Admission medications   Medication Sig Start Date End Date Taking? Authorizing Provider  aspirin EC 81 MG tablet Take 1 tablet (81 mg total) by mouth daily. 11/02/15   Bary Leriche, PA-C  baclofen (LIORESAL) 10 MG tablet Take 1 tablet (10 mg total) by mouth 3 (three) times daily. Start  with 1/2 tablet three times daily x 2 weeks then one tablet three times daily 01/01/16   Garvin Fila, MD  cetirizine (ZYRTEC) 10 MG tablet Take 10 mg by mouth as needed for allergies.     Historical Provider, MD  epoetin alfa (EPOGEN,PROCRIT) 30865 UNIT/ML injection Inject 5,000 Units into the skin once a week.  09/06/15   Historical Provider, MD  fluticasone furoate-vilanterol (BREO ELLIPTA) 100-25 MCG/INH AEPB Inhale 1 puff into the lungs daily. 11/02/15   Bary Leriche, PA-C  Insulin Glargine (LANTUS) 100 UNIT/ML Solostar Pen Inject 25 Units into the skin daily at 10 pm. 11/02/15   Ivan Anchors Love, PA-C  midodrine (PROAMATINE) 2.5 MG tablet Take 1 tablet (2.5 mg total) by mouth 2 (two) times daily with a  meal. 11/02/15   Ivan Anchors Love, PA-C  multivitamin (RENA-VIT) TABS tablet Take 1 tablet by mouth at bedtime. 11/02/15   Ivan Anchors Love, PA-C  NEEDLE, DISP, 39 G (BD DISP NEEDLES) 27G X 1/2" MISC 1 application by Does not apply route daily after supper. 11/02/15   Ivan Anchors Love, PA-C  pantoprazole (PROTONIX) 40 MG tablet Take 1 tablet (40 mg total) by mouth 2 (two) times daily. 11/02/15   Ivan Anchors Love, PA-C  SENSIPAR 60 MG tablet Take 60 mg by mouth daily. 12/27/15   Historical Provider, MD  traMADol (ULTRAM) 50 MG tablet Take 1 tablet (50 mg total) by mouth every 6 (six) hours as needed for moderate pain. 12/10/15   Ulyses Amor, PA-C  warfarin (COUMADIN) 5 MG tablet Take 1.5 tablets (7.5 mg total) by mouth daily at 6 PM. 11/02/15   Ivan Anchors Love, PA-C   There were no vitals taken for this visit. Physical Exam  Constitutional: He appears well-developed and well-nourished. He appears lethargic.  Non-toxic appearance. No distress.  HENT:  Head: Normocephalic and atraumatic.  Mouth/Throat:    Eyes: Conjunctivae, EOM and lids are normal. Pupils are equal, round, and reactive to light.  Neck: Normal range of motion. Neck supple. No tracheal deviation present. No thyroid mass present.  Cardiovascular: Normal rate, regular rhythm and normal heart sounds.  Exam reveals no gallop.   No murmur heard. Pulmonary/Chest: Effort normal and breath sounds normal. No stridor. No respiratory distress. He has no decreased breath sounds. He has no wheezes. He has no rhonchi. He has no rales.  Abdominal: Soft. Normal appearance and bowel sounds are normal. He exhibits no distension. There is no tenderness. There is no rebound and no CVA tenderness.  Musculoskeletal: Normal range of motion. He exhibits no edema or tenderness.  Neurological: He appears lethargic. He displays tremor. A cranial nerve deficit is present. GCS eye subscore is 4. GCS verbal subscore is 4. GCS motor subscore is 6.  Right lower extremity  twitching appreciated the patient does follow voluntary commands. Right facial droop noted. 2 of 5 strength in right upper extremity. Each is dysarthric. Patient's eyes are deviated to the left. Couldn't evaluate cerebellar functions. Withdraws to painful simulation in his extremities  Skin: Skin is warm and dry. No abrasion and no rash noted.  Psychiatric: His affect is blunt. His speech is delayed. He is slowed.  Nursing note and vitals reviewed.   ED Course  Procedures (including critical care time) Labs Review Labs Reviewed  CBC - Abnormal; Notable for the following:    WBC 20.4 (*)    RBC 3.16 (*)    Hemoglobin 9.2 (*)    HCT 25.3 (*)  MCHC 36.4 (*)    RDW 16.9 (*)    Platelets 420 (*)    All other components within normal limits  DIFFERENTIAL - Abnormal; Notable for the following:    Neutro Abs 13.3 (*)    Lymphs Abs 4.9 (*)    Monocytes Absolute 1.6 (*)    Basophils Absolute 0.2 (*)    All other components within normal limits  I-STAT CHEM 8, ED - Abnormal; Notable for the following:    Potassium 2.9 (*)    BUN 36 (*)    Creatinine, Ser 7.30 (*)    Glucose, Bld 181 (*)    Calcium, Ion 0.79 (*)    Hemoglobin 11.2 (*)    HCT 33.0 (*)    All other components within normal limits  ETHANOL  PROTIME-INR  APTT  COMPREHENSIVE METABOLIC PANEL  URINE RAPID DRUG SCREEN, HOSP PERFORMED  URINALYSIS, ROUTINE W REFLEX MICROSCOPIC (NOT AT [facility])  I-STAT TROPOININ, ED    Imaging Review Ct Head Wo Contrast  01/06/2016  CLINICAL DATA:  Code stroke.  Right-sided weakness. EXAM: CT HEAD WITHOUT CONTRAST TECHNIQUE: Contiguous axial images were obtained from the base of the skull through the vertex without intravenous contrast. COMPARISON:  None FINDINGS: Prominence of the sulci and ventricles are identified compatible with brain atrophy. There is low attenuation within the subcortical and periventricular white matter compatible with chronic microvascular disease. Scattered small areas  of encephalomalacia are identified within bilateral parietal lobes, image 22 of series 201 and image 21 of series 201 and the right occipital lobe, image 16 of series 201. No evidence for acute brain infarct, intracranial hemorrhage or mass. Retention cyst versus polyp is noted in the left maxillary sinus. The remaining paranasal sinuses and mastoid air cells are clear. The calvarium is intact. IMPRESSION: 1. No acute intracranial abnormalities. 2. Age advanced brain atrophy. 3. Chronic areas of encephalomalacia noted bilaterally. Electronically Signed   By: Kerby Moors M.D.   On: 01/06/2016 12:03   I have personally reviewed and evaluated these images and lab results as part of my medical decision-making.   EKG Interpretation   Date/Time:  Sunday January 06 2016 12:39:42 EDT Ventricular Rate:  113 PR Interval:    QRS Duration: 91 QT Interval:  391 QTC Calculation: 536 R Axis:   75 Text Interpretation:  Atrial fibrillation Inferior infarct, age  indeterminate Prolonged QT interval Confirmed by Zenia Resides  MD, Khalid Lacko  ([id number]) on 01/06/2016 1:01:45 PM      MDM   Final diagnoses:  Stroke (cerebrum) ([place])    Patient was a code stroke initially when he arrived. Had question of a seizure activity and was given Ativan as well as was loaded with Keppra. Head CT as well as MRI are negative for acute stroke. Patient's neurological exam improved and he is now able to carry conversation. His neurological exam is been to baseline according to the patient and his wife. Discussed with Dr. Leonel Ramsay from neurology and agrees the patient should be made for observation.   CRITICAL CARE Performed by: Leota Jacobsen Total critical care time: 45 minutes Critical care time was exclusive of separately billable procedures and treating other patients. Critical care was necessary to treat or prevent imminent or life-threatening deterioration. Critical care was time spent personally by me on the following  activities: development of treatment plan with patient and/or surrogate as well as nursing, discussions with consultants, evaluation of patient's response to treatment, examination of patient, obtaining history from patient or surrogate, ordering and  performing treatments and interventions, ordering and review of laboratory studies, ordering and review of radiographic studies, pulse oximetry and re-evaluation of patient's condition.     Lacretia Leigh, MD 01/06/16 1303  Lacretia Leigh, MD 01/06/16 1350

## 2016-01-06 NOTE — ED Notes (Signed)
Patient arrived by [facility] after being called to home for possible seizure. He was found altered with right sided mouth droop and right sided weakness. Spouse states seizure lasted seconds but had a 30-40 period of unresponsiveness. Last dialysis was Friday and catheter right subclavian. Patient with slurred speech on arrival. No tongue trauma noted

## 2016-01-06 NOTE — H&P (Signed)
[facility] History and Physical  [place] VHQ:[id number] DOB: [birth year] DOA: 01/06/2016  Referring physician: Zenia Resides PCP: Foye Spurling, MD   Chief Complaint: seizure  HPI: Isaiah [name] [name] is a very pleasant 50 y.o. male past medical history end-stage renal disease Monday Wednesday Friday dialysis, diabetes, hypertension, recent stroke with some right-sided weakness, since emergency department with the chief complaint of seizure-like activity. So evaluation in the emergency department rules out recurrent stroke with MRI. Patient is evaluated by neurology who opined likely seizure recommended admission.  Information is obtained from the patient and his wife who is at the bedside. Reports patient "doing quite well" over the last several weeks participating in physical therapy at home engaging in social activities outside the home. This morning they awakened early he complained of pain in his hand. She gave him analgesia and they went back to sleep. 2 hours later they awakened as he was getting ready for his morning wife reports he kept repeating "Sunday". She said he had a blank look on his face and she notices speech was somewhat slower and slurred. He was not following commands. Shortly after onset of these symptoms wife reports his "entire body contracted he began foaming at the mouth". She states this activity lasted approximately 5 minutes. EMS was called. Reportedly his blood sugar was over 100. Wife states there is been no recent illness no missing of dialysis. He's had no complaints of chest pain palpitation abdominal pain nausea vomiting diarrhea. There been no fever chills recent sick contacts or travel. He did suffer a CVA 4 months ago but has been dissipating and physical therapy and rehabilitation and doing well.  In the emergency department he is evaluated by neurology. Recurrent stroke is ruled out with head CT and MRI. He was provided with Ativan and Vimpat  Time of my  exam he is somewhat lethargic but oriented to self and place able to follow simple commands and answer questions. He is afebrile hemodynamically stable and not hypoxic.  Review of Systems:  10 point review of systems complete and all systems are negative except as indicated in the history of present illness  Past Medical History  Diagnosis Date  . Diabetes mellitus without complication ([place])   . Sickle cell-thalassemia disease ([place])     a. Sickle cell trait.  . Pulmonary embolism ([place]) 07/2011    a. Tx with Coumadin for 6 months (unknown cause per patient).  . Hyperkalemia 07/2011  . Acute renal failure ([place]) 07/2011  . Monoclonal gammopathy   . Sleep apnea     a. uses CPAP.  [name] Hypertension   . Gout   . Pulmonary embolism ([place]) 07/2011; 09/27/2014    a. Bilat PE 07/2011 - unclear cause, tx with 6 months Coumadin.;   . Monoclonal gammopathies   . Anemia   . Asthma   . OSA on CPAP   . History of recent blood transfusion 10/27/14    2 Units PRBC's  . ESRD (end stage renal disease) on dialysis ([place])   . Stroke [facility])    Past Surgical History  Procedure Laterality Date  . Cholecystectomy    . Bone marrow biopsy    . Other surgical history      Retinal surgery  . Pars plana vitrectomy  02/17/2012    Procedure: PARS PLANA VITRECTOMY WITH 25 GAUGE;  Surgeon: Hayden Pedro, MD;  Location: [facility];  Service: Ophthalmology;  Laterality: Right;  . Cholecystectomy  1990's?  . Eye surgery Right   .  Insertion of dialysis catheter Right 10/16/2015    Procedure: INSERTION OF PALINDROME DIALYSIS CATHETER ;  Surgeon: Elam Dutch, MD;  Location: [place];  Service: Vascular;  Laterality: Right;  . Av fistula placement Right 12/05/2015    Procedure: INSERTION OF ARTERIOVENOUS (AV) GORE-TEX GRAFT ARM;  Surgeon: Elam Dutch, MD;  Location: [facility];  Service: Vascular;  Laterality: Right;   Social History:  reports that he has never smoked. He has never used smokeless tobacco. He reports that he  does not drink alcohol or use illicit drugs. He lives at home with his wife he ambulates independently moderate assist with ADLs  Allergies  Allergen Reactions  . Codeine Rash and Other (See Comments)    Unknown reaction (patient says it was more serious than just a rash, but he can't remember what happened)    Family History  Problem Relation Age of Onset  . Hypertension Mother   . Diabetes Mother   . Sickle cell anemia Brother   . Diabetes type I Brother   . Kidney disease Brother   . Stroke Father      Prior to Admission medications   Medication Sig Start Date End Date Taking? Authorizing Provider  aspirin EC 81 MG tablet Take 1 tablet (81 mg total) by mouth daily. 11/02/15  Yes Ivan Anchors Love, PA-C  baclofen (LIORESAL) 10 MG tablet Take 1 tablet (10 mg total) by mouth 3 (three) times daily. Start with 1/2 tablet three times daily x 2 weeks then one tablet three times daily Patient taking differently: Take 10 mg by mouth 3 (three) times daily.  01/01/16  Yes Garvin Fila, MD  cetirizine (ZYRTEC) 10 MG tablet Take 10 mg by mouth daily as needed for allergies.    Yes Historical Provider, MD  epoetin alfa (EPOGEN,PROCRIT) 19622 UNIT/ML injection Inject 5,000 Units into the skin once a week.  09/06/15  Yes Historical Provider, MD  fluticasone furoate-vilanterol (BREO ELLIPTA) 100-25 MCG/INH AEPB Inhale 1 puff into the lungs daily. 11/02/15  Yes Ivan Anchors Love, PA-C  Insulin Glargine (LANTUS) 100 UNIT/ML Solostar Pen Inject 25 Units into the skin daily at 10 pm. 11/02/15  Yes Ivan Anchors Love, PA-C  midodrine (PROAMATINE) 2.5 MG tablet Take 1 tablet (2.5 mg total) by mouth 2 (two) times daily with a meal. 11/02/15  Yes Ivan Anchors Love, PA-C  multivitamin (RENA-VIT) TABS tablet Take 1 tablet by mouth at bedtime. 11/02/15  Yes Pamela S Love, PA-C  NEEDLE, DISP, 27 G (BD DISP NEEDLES) 27G X 1/2" MISC 1 application by Does not apply route daily after supper. 11/02/15  Yes Ivan Anchors Love, PA-C    pantoprazole (PROTONIX) 40 MG tablet Take 1 tablet (40 mg total) by mouth 2 (two) times daily. 11/02/15  Yes Ivan Anchors Love, PA-C  SENSIPAR 60 MG tablet Take 60 mg by mouth daily. 12/27/15  Yes Historical Provider, MD  traMADol (ULTRAM) 50 MG tablet Take 1 tablet (50 mg total) by mouth every 6 (six) hours as needed for moderate pain. 12/10/15  Yes Ulyses Amor, PA-C  warfarin (COUMADIN) 5 MG tablet Take 1.5 tablets (7.5 mg total) by mouth daily at 6 PM. 11/02/15  Yes Bary Leriche, PA-C   Physical Exam: Filed Vitals:   01/06/16 1155 01/06/16 1315  BP: 125/84 141/96  Pulse: 113 113  Resp: 18 20  Weight: 92.987 kg (205 lb)   SpO2: 95% 98%    Wt Readings from Last 3 Encounters:  01/06/16 92.987  kg (205 lb)  01/01/16 93.26 kg (205 lb 9.6 oz)  01/01/16 92.443 kg (203 lb 12.8 oz)    General:  Appears calm and comfortable, obese  Eyes: PERRL, normal lids, irises & conjunctiva ENT: grossly normal hearing, its with dried blood tongue pink slightly dry. Moderate size laceration to left side of tongue with flap. No active bleeding  Neck: no LAD, masses or thyromegaly Cardiovascular: Irregularly irregular, no m/r/g. No LE edema. Telemetry: SR, no arrhythmias  Respiratory: CTA bilaterally, no w/r/r. Normal respiratory effort. Abdomen: soft, ntnd Positive bowel sounds throughout  Skin: no rash or induration seen on limited exam Conclusion but dry  Musculoskeletal: grossly normal tone BUE/BLE Psychiatric: grossly normal mood and affect, speech fluent and appropriate Neurologic: grossly non-focal. Speech slow somewhat deliberate but clear . Right facial droop. Right grip 3 out of 5 left grip 5 out of 5 .           Labs on Admission:  Basic Metabolic Panel:  Recent Labs Lab 01/01/16 1027 01/06/16 1144 01/06/16 1153  NA 134* 138 139  K 3.0* 2.9* 2.9*  CL  --  102 101  CO2 25 15*  --   GLUCOSE 81 192* 181*  BUN 18.5 35* 36*  CREATININE 4.7* 7.31* 7.30*  CALCIUM 8.9 7.4*  --    Liver  Function Tests:  Recent Labs Lab 01/01/16 1027 01/06/16 1144  AST 15 30  ALT 9 14*  ALKPHOS 104 113  BILITOT 1.71* 1.2  PROT 8.7*  8.0 8.0  ALBUMIN 3.6 3.3*   No results for input(s): LIPASE, AMYLASE in the last 168 hours. No results for input(s): AMMONIA in the last 168 hours. CBC:  Recent Labs Lab 01/01/16 1027 01/06/16 1144 01/06/16 1153  WBC 20.2* 20.4*  --   NEUTROABS 15.6* 13.3*  --   HGB 9.2* 9.2* 11.2*  HCT 25.4* 25.3* 33.0*  MCV 79.6 80.1  --   PLT 588* 420*  --    Cardiac Enzymes: No results for input(s): CKTOTAL, CKMB, CKMBINDEX, TROPONINI in the last 168 hours.  BNP (last 3 results) No results for input(s): BNP in the last 8760 hours.  ProBNP (last 3 results) No results for input(s): PROBNP in the last 8760 hours.  CBG: No results for input(s): GLUCAP in the last 168 hours.  Radiological Exams on Admission: Ct Head Wo Contrast  01/06/2016  CLINICAL DATA:  Code stroke.  Right-sided weakness. EXAM: CT HEAD WITHOUT CONTRAST TECHNIQUE: Contiguous axial images were obtained from the base of the skull through the vertex without intravenous contrast. COMPARISON:  None FINDINGS: Prominence of the sulci and ventricles are identified compatible with brain atrophy. There is low attenuation within the subcortical and periventricular white matter compatible with chronic microvascular disease. Scattered small areas of encephalomalacia are identified within bilateral parietal lobes, image 22 of series 201 and image 21 of series 201 and the right occipital lobe, image 16 of series 201. No evidence for acute brain infarct, intracranial hemorrhage or mass. Retention cyst versus polyp is noted in the left maxillary sinus. The remaining paranasal sinuses and mastoid air cells are clear. The calvarium is intact. IMPRESSION: 1. No acute intracranial abnormalities. 2. Age advanced brain atrophy. 3. Chronic areas of encephalomalacia noted bilaterally. Electronically Signed   By:  Kerby Moors M.D.   On: 01/06/2016 12:03   Mr Jodene Nam Head Wo Contrast  01/06/2016  CLINICAL DATA:  Code stroke. Right-sided weakness. Bilateral anterior and posterior circulation infarcts in 09/2015 related to deep venous  thrombosis, pulmonary emboli, and paradoxical emboli. EXAM: MRI HEAD WITHOUT CONTRAST MRA HEAD WITHOUT CONTRAST TECHNIQUE: Multiplanar, multiecho pulse sequences of the brain and surrounding structures were obtained without intravenous contrast. Angiographic images of the head were obtained using MRA technique without contrast. COMPARISON:  Head CT 01/06/2016. Outside head CT, MRI, and MRA 09/30/2015 from [facility]. FINDINGS: MRI HEAD FINDINGS Only axial diffusion imaging was performed at the request of the stroke neurologist. There is an old right occipital lobe cortical infarct which was acute in January. There are associated chronic blood products, and abnormal trace diffusion signal associated with this infarct is felt to be artifactual. There is also an old left MCA infarct involving the posterior frontal lobe which was acute in January. A small amount of chronic blood products are also suspected in this region. There is curvilinear trace diffusion signal along the margin of the infarct which is largely without evidence of any corresponding reduced ADC except for possibly a 5 mm focus superomedially. Old infarcts are also seen in the right frontal lobe (with chronic blood products) and both cerebellar hemispheres. There is no midline shift. MRA HEAD FINDINGS Study is mildly motion degraded. Visualized distal vertebral arteries are patent to the basilar with the right being dominant. PICA and AICA origins are not well evaluated. SCA origins are patent. Basilar artery is patent with slight artifact through its midportion at the slab interface but no evidence of significant stenosis. There is a patent left posterior communicating artery. The PCAs are patent without evidence of  significant stenosis on the right. The left P1 segment is mildly hypoplastic based on the prior outside MRA, however there is suggestion of new severe tandem P1 stenosis on the current examination though this appearance may be accentuated due to motion artifact. Internal carotid arteries are patent from skullbase to carotid termini without significant stenosis. The right A1 segment is hypoplastic. The left A1 segment is patent without stenosis. A2 segments appear patent with assessment for stenosis limited by motion. M1 segments are patent bilaterally without stenosis. Right MCA bifurcation and major branch vessels appear patent. The left MCA bifurcation is patent, although there is likely moderate narrowing of the proximal M2 inferior division with moderate to prominent attenuation of more distal branch vessels. The patency of the proximal most aspect of this into vessel near the bifurcation appears improved compared to the prior outside MRA, although there may have been some artifactually diminished signal at this level on the prior study due to location at the slab interface. The more distal branch vessel attenuation appears greater on the current examination, although the current study has more motion artifact which limits assessment of the smaller vessels. IMPRESSION: 1. Diffusion only MRI without definite acute infarct identified. Multiple late subacute to chronic infarcts bilaterally as above with associated chronic blood products accounting for diffusion abnormality. Posterior left frontal diffusion abnormality could also potentially reflect the sequelae of recent seizure activity, though a trace amount of superimposed acute ischemia cannot be excluded. 2. Motion degraded MRA without emergent large vessel occlusion. 3. Proximal left M2 branch stenosis (with apparent improved patency compared to prior outside MRA) with progressive attenuation of more distal left MCA branch vessels. 4. New moderate to severe  left P1 PCA stenoses, with assessment limited by motion artifact. Study was reviewed in person with Dr. Leonel Ramsay on 01/06/2016 at 12:30 p.m. Electronically Signed   By: Logan Bores M.D.   On: 01/06/2016 13:53   Mr [employer] W/o Cm  01/06/2016  CLINICAL DATA:  Code stroke. Right-sided weakness. Bilateral anterior and posterior circulation infarcts in 09/2015 related to deep venous thrombosis, pulmonary emboli, and paradoxical emboli. EXAM: MRI HEAD WITHOUT CONTRAST MRA HEAD WITHOUT CONTRAST TECHNIQUE: Multiplanar, multiecho pulse sequences of the brain and surrounding structures were obtained without intravenous contrast. Angiographic images of the head were obtained using MRA technique without contrast. COMPARISON:  Head CT 01/06/2016. Outside head CT, MRI, and MRA 09/30/2015 from [facility]. FINDINGS: MRI HEAD FINDINGS Only axial diffusion imaging was performed at the request of the stroke neurologist. There is an old right occipital lobe cortical infarct which was acute in January. There are associated chronic blood products, and abnormal trace diffusion signal associated with this infarct is felt to be artifactual. There is also an old left MCA infarct involving the posterior frontal lobe which was acute in January. A small amount of chronic blood products are also suspected in this region. There is curvilinear trace diffusion signal along the margin of the infarct which is largely without evidence of any corresponding reduced ADC except for possibly a 5 mm focus superomedially. Old infarcts are also seen in the right frontal lobe (with chronic blood products) and both cerebellar hemispheres. There is no midline shift. MRA HEAD FINDINGS Study is mildly motion degraded. Visualized distal vertebral arteries are patent to the basilar with the right being dominant. PICA and AICA origins are not well evaluated. SCA origins are patent. Basilar artery is patent with slight artifact through its  midportion at the slab interface but no evidence of significant stenosis. There is a patent left posterior communicating artery. The PCAs are patent without evidence of significant stenosis on the right. The left P1 segment is mildly hypoplastic based on the prior outside MRA, however there is suggestion of new severe tandem P1 stenosis on the current examination though this appearance may be accentuated due to motion artifact. Internal carotid arteries are patent from skullbase to carotid termini without significant stenosis. The right A1 segment is hypoplastic. The left A1 segment is patent without stenosis. A2 segments appear patent with assessment for stenosis limited by motion. M1 segments are patent bilaterally without stenosis. Right MCA bifurcation and major branch vessels appear patent. The left MCA bifurcation is patent, although there is likely moderate narrowing of the proximal M2 inferior division with moderate to prominent attenuation of more distal branch vessels. The patency of the proximal most aspect of this into vessel near the bifurcation appears improved compared to the prior outside MRA, although there may have been some artifactually diminished signal at this level on the prior study due to location at the slab interface. The more distal branch vessel attenuation appears greater on the current examination, although the current study has more motion artifact which limits assessment of the smaller vessels. IMPRESSION: 1. Diffusion only MRI without definite acute infarct identified. Multiple late subacute to chronic infarcts bilaterally as above with associated chronic blood products accounting for diffusion abnormality. Posterior left frontal diffusion abnormality could also potentially reflect the sequelae of recent seizure activity, though a trace amount of superimposed acute ischemia cannot be excluded. 2. Motion degraded MRA without emergent large vessel occlusion. 3. Proximal left M2 branch  stenosis (with apparent improved patency compared to prior outside MRA) with progressive attenuation of more distal left MCA branch vessels. 4. New moderate to severe left P1 PCA stenoses, with assessment limited by motion artifact. Study was reviewed in person with Dr. Leonel Ramsay on 01/06/2016 at 12:30 p.m. Electronically Signed  By: Logan Bores M.D.   On: 01/06/2016 13:53    EKG: Independently reviewed. Atrial fibrillationInferior infarct, age indeterminate Prolonged QT interval   Assessment/Plan Principal Problem:   Seizure ([place]) Active Problems:   DM (diabetes mellitus) type II controlled with renal manifestation ([facility])   HTN (hypertension)   Morbid obesity ([place])   Diabetic retinopathy of both eyes ([place])   ESRD on dialysis ([place])   Anemia   Tongue wound  1. Seizure. Etiology uncertain. Patient high risk due to recent CVA. CT of the head and MRI brain rule out recurrent CVA. He does have a leukocytosis which appears to be somewhat chronic but he is afebrile. He has not missed any dialysis. Initial troponin negative Evaluated by neurology in the emergency department. Ativan was provided as well as Vimpat -Admit to telemetry -Seizure precautions -Defer EEG to neurology  #2. End-stage renal disease on dialysis. Creatinine 7.3 on admission. Chart review indicates this is close to his baseline. He is a Monday Wednesday Friday dialysis patient. He has not missed any dialysis sessions -Nephrology consult for dialysis tomorrow  #3. Hypokalemia. Potassium level II.9 on admission. Likely related to above. -We'll replete -Check magnesium level -Monitor on telemetry  #4. Leukocytosis. Chronic. Current WBC 20.4 which appears to be above his baseline. He is afebrile. -We will try to get urinalysis with little bit of urinary makes -Obtain a chest x-ray -Monitor  #5. Diabetes. He is on Lantus. Serum glucose 192 on admission.  -We'll obtain hemoglobin A1c -We'll continue Lantus at slightly  lower dose -Sliding scale insulin for optimal control  6. Recent CVA/history of PE/A. Fib. Chadvasc score 5. Chart review indicates 2 months ago patient developed right-sided weakness and speech difficulties. Note indicates he was found to have a PFO. Neuro note indicates that her coag studies were negative and a TEE and was started on heparin. He was discharged on Coumadin receiving hemodialysis via venous access catheter and recently had a shunt placed. MRI rules out recurrent stroke. -Continue home medications -Coumadin per pharmacy -Physical therapy  #.7 Hypertension. Fair control in the emergency department. Home medications include no antihypertensives. -Monitor closely  #8. Anemia. Likely related to chronic disease. Hemoglobin 9.2 on admission. Chart review indicates this is very close to his baseline. Did have some bleeding from the laceration on chest,. This has resolved. He's also on Coumadin -Monitor  #9. Tongue wound. Small laceration on left side likely occurred during seizure activity. Dried blood on lips no active bleeding noted at this time -Monitor -Into liquid diet to minimize agitation of wound to allow for healing -If no improvement consider ENT  [place] nephrology Code Status: full DVT Prophylaxis: Family Communication: wife at bedside Disposition Plan: may need short term placement  Time spent: 46 minutes  [facility]

## 2016-01-06 NOTE — Progress Notes (Signed)
[place] for Warfarin Indication: pulmonary embolus - history  Allergies  Allergen Reactions  . Codeine Rash and Other (See Comments)    Unknown reaction (patient says it was more serious than just a rash, but he can't remember what happened)   Patient Measurements: Weight: 205 lb (92.987 kg)  Vital Signs: BP: 141/96 mmHg (04/30 1315) Pulse Rate: 113 (04/30 1315)  Labs:  Recent Labs  01/06/16 1144 01/06/16 1153  HGB 9.2* 11.2*  HCT 25.3* 33.0*  PLT 420*  --   APTT 41*  --   LABPROT 26.9*  --   INR 2.53*  --   CREATININE 7.31* 7.30*   Estimated Creatinine Clearance: 13.6 mL/min (by C-G formula based on Cr of 7.3).  Medical History: Past Medical History  Diagnosis Date  . Diabetes mellitus without complication ([place])   . Sickle cell-thalassemia disease ([place])     a. Sickle cell trait.  . Pulmonary embolism ([place]) 07/2011    a. Tx with Coumadin for 6 months (unknown cause per patient).  . Hyperkalemia 07/2011  . Acute renal failure ([place]) 07/2011  . Monoclonal gammopathy   . Sleep apnea     a. uses CPAP.  [name] Hypertension   . Gout   . Pulmonary embolism ([place]) 07/2011; 09/27/2014    a. Bilat PE 07/2011 - unclear cause, tx with 6 months Coumadin.;   . Monoclonal gammopathies   . Anemia   . Asthma   . OSA on CPAP   . History of recent blood transfusion 10/27/14    2 Units PRBC's  . ESRD (end stage renal disease) on dialysis ([place])   . Stroke [facility])    Assessment: 50yo male with multiple medical problems including history of bilateral PE.  He is on chronic Warfarin therapy for this.  He is admitted today for possible CVA but did not qualify for TPA.  We have been asked to resume his Warfarin therapy.   He is without noted bleeding complications.  Home Dose:  Warfarin 7.20m daily Labs: As noted above - H/H and platelets are stable.  PT/INR - 26.9/2.53  Goal of Therapy:  INR 2-3 Monitor platelets by anticoagulation protocol:  Yes   Plan:  -Continue home regimen of Warfarin 7.548mdaily - PT/INR daily - CBC   NiRober MinionPharmD., MS Clinical Pharmacist Pager:  [phone] you for allowing pharmacy to be part of this patients care team. 01/06/2016,1:50 PM

## 2016-01-06 NOTE — ED Notes (Addendum)
As CT was completed patient developed right foot shaking. Neurologist ordered ativan on arrival back to treatment room. Given as ordered. Spouse reports that patient had stroke in January and has some ongoing right sided weakness from the previous stroke and ongoing PT.

## 2016-01-07 ENCOUNTER — Telehealth: Payer: Self-pay | Admitting: Neurology

## 2016-01-07 ENCOUNTER — Observation Stay (HOSPITAL_COMMUNITY): Payer: BLUE CROSS/BLUE SHIELD

## 2016-01-07 DIAGNOSIS — Z794 Long term (current) use of insulin: Secondary | ICD-10-CM

## 2016-01-07 DIAGNOSIS — N186 End stage renal disease: Secondary | ICD-10-CM

## 2016-01-07 DIAGNOSIS — E876 Hypokalemia: Secondary | ICD-10-CM

## 2016-01-07 DIAGNOSIS — E1121 Type 2 diabetes mellitus with diabetic nephropathy: Secondary | ICD-10-CM

## 2016-01-07 DIAGNOSIS — Z992 Dependence on renal dialysis: Secondary | ICD-10-CM

## 2016-01-07 LAB — CBC
HCT: 25.1 % — ABNORMAL LOW (ref 39.0–52.0)
Hemoglobin: 9.2 g/dL — ABNORMAL LOW (ref 13.0–17.0)
MCH: 29.1 pg (ref 26.0–34.0)
MCHC: 36.7 g/dL — ABNORMAL HIGH (ref 30.0–36.0)
MCV: 79.4 fL (ref 78.0–100.0)
Platelets: 539 10*3/uL — ABNORMAL HIGH (ref 150–400)
RBC: 3.16 MIL/uL — ABNORMAL LOW (ref 4.22–5.81)
RDW: 17 % — ABNORMAL HIGH (ref 11.5–15.5)
WBC: 18.3 10*3/uL — ABNORMAL HIGH (ref 4.0–10.5)

## 2016-01-07 LAB — HEMOGLOBIN A1C
Hgb A1c MFr Bld: 4.4 % — ABNORMAL LOW (ref 4.8–5.6)
Mean Plasma Glucose: 80 mg/dL

## 2016-01-07 LAB — RENAL FUNCTION PANEL
Albumin: 3.3 g/dL — ABNORMAL LOW (ref 3.5–5.0)
Anion gap: 13 (ref 5–15)
BUN: 41 mg/dL — ABNORMAL HIGH (ref 6–20)
CO2: 22 mmol/L (ref 22–32)
Calcium: 7 mg/dL — ABNORMAL LOW (ref 8.9–10.3)
Chloride: 101 mmol/L (ref 101–111)
Creatinine, Ser: 7.34 mg/dL — ABNORMAL HIGH (ref 0.61–1.24)
GFR calc Af Amer: 9 mL/min — ABNORMAL LOW (ref >60)
GFR calc non Af Amer: 8 mL/min — ABNORMAL LOW (ref >60)
Glucose, Bld: 109 mg/dL — ABNORMAL HIGH (ref 65–99)
Phosphorus: 5.1 mg/dL — ABNORMAL HIGH (ref 2.5–4.6)
Potassium: 3.3 mmol/L — ABNORMAL LOW (ref 3.5–5.1)
Sodium: 136 mmol/L (ref 135–145)

## 2016-01-07 LAB — GLUCOSE, CAPILLARY
Glucose-Capillary: 42 mg/dL — CL (ref 65–99)
Glucose-Capillary: 113 mg/dL — ABNORMAL HIGH (ref 65–99)
Glucose-Capillary: 104 mg/dL — ABNORMAL HIGH (ref 65–99)

## 2016-01-07 LAB — PROTIME-INR
INR: 2.75 — ABNORMAL HIGH (ref 0.00–1.49)
Prothrombin Time: 28.7 seconds — ABNORMAL HIGH (ref 11.6–15.2)

## 2016-01-07 MED ORDER — LORAZEPAM 2 MG/ML IJ SOLN
1.0000 mg | Freq: Four times a day (QID) | INTRAMUSCULAR | Status: DC | PRN
Start: 2016-01-07 — End: 2016-01-11
  Administered 2016-01-07 – 2016-01-08 (×2): 1 mg via INTRAVENOUS
  Filled 2016-01-07 (×2): qty 1

## 2016-01-07 MED ORDER — POTASSIUM CHLORIDE CRYS ER 20 MEQ PO TBCR
20.0000 meq | EXTENDED_RELEASE_TABLET | Freq: Once | ORAL | Status: AC
  Administered 2016-01-07: 20 meq via ORAL
  Filled 2016-01-07: qty 1

## 2016-01-07 MED ORDER — PENTAFLUOROPROP-TETRAFLUOROETH EX AERO: 1.0000 "application " | INHALATION_SPRAY | CUTANEOUS | Status: DC | PRN

## 2016-01-07 MED ORDER — WARFARIN SODIUM 7.5 MG PO TABS: 7.5000 mg | ORAL_TABLET | Freq: Once | ORAL | Status: AC

## 2016-01-07 MED ORDER — HEPARIN SODIUM (PORCINE) 1000 UNIT/ML DIALYSIS: 2000.0000 [IU] | INTRAMUSCULAR | Status: DC | PRN

## 2016-01-07 MED ORDER — SODIUM CHLORIDE 0.9 % IV SOLN: 100.0000 mL | INTRAVENOUS | Status: DC | PRN

## 2016-01-07 MED ORDER — HEPARIN SODIUM (PORCINE) 1000 UNIT/ML DIALYSIS
3000.0000 [IU] | Freq: Once | INTRAMUSCULAR | Status: DC
  Filled 2016-01-07: qty 3

## 2016-01-07 MED ORDER — LIDOCAINE HCL (PF) 1 % IJ SOLN: 5.0000 mL | INTRAMUSCULAR | Status: DC | PRN

## 2016-01-07 MED ORDER — LIDOCAINE-PRILOCAINE 2.5-2.5 % EX CREA: 1.0000 "application " | TOPICAL_CREAM | CUTANEOUS | Status: DC | PRN

## 2016-01-07 MED ORDER — HEPARIN SODIUM (PORCINE) 1000 UNIT/ML DIALYSIS: 1000.0000 [IU] | INTRAMUSCULAR | Status: DC | PRN

## 2016-01-07 MED ORDER — ALTEPLASE 2 MG IJ SOLR: 2.0000 mg | Freq: Once | INTRAMUSCULAR | Status: DC | PRN

## 2016-01-07 MED ORDER — RESOURCE THICKENUP CLEAR PO POWD
ORAL | Status: DC | PRN
  Filled 2016-01-07: qty 125

## 2016-01-07 NOTE — Progress Notes (Signed)
Subjective: Patient has worsened over night per wife. He usually can hold a conversation but now has perseverating on last word said. HE is abel to follow commands. Has a new right facial droop and weaker on the right per wife.   Exam: Filed Vitals:   01/07/16 0201 01/07/16 0640  BP: 123/91 121/84  Pulse: 98 83  Temp: 98.8 F (37.1 C) 97.5 F (36.4 C)  Resp: 22 18      Gen: In bed, NAD MS: awake and alert. Able to follow simple commands. Perseverating on last word stated. He shows dysarthria and likely expressive aphasia but hard to tell with perseveration.  CN: right hemianopsia, right facial droop, inability to cross midline to the right. Facial sensation unable to get due to perseveration.  Motor: right hand shows flexion contracture of the fingers with ability to grip, inability to lift arm off the bed and able to lift leg about 3 inches off bed.  Left side has 5/5 Sensory: unable to get reliable sensation due to perseveration.    Pertinent Labs: [phone] this AM his Glucose dropped to [place] PA-C [facility] [phone]  Impression: New onset seizure in setting of previous CVA. Previous MRI showed possible stroke but no correlation with ADC. This AM showing worsening of right sided symptoms concerning for Stroke.    Recommendations: 1) STAT MRI Brain. If negative repeat EEG.     01/07/2016, 9:58 AM   Agree with above

## 2016-01-07 NOTE — Care Management Note (Signed)
Case Management Note  Patient Details  Name: Isaiah [name] [facility] MRN: [id number] Date of Birth: 01-Jul-1966  Subjective/Objective:      Patient admitted with seizure. He is from home with spouse.              Action/Plan: Awaiting PT/OT recs. CM following for d/c needs.   Expected Discharge Date:                  Expected Discharge Plan:     In-House Referral:     Discharge planning Services     Post Acute Care Choice:    Choice offered to:     DME Arranged:    DME Agency:     HH Arranged:    HH Agency:     Status of Service:  In process, will continue to follow  Medicare Important Message Given:    Date Medicare IM Given:    Medicare IM give by:    Date Additional Medicare IM Given:    Additional Medicare Important Message give by:     If discussed at [place] of Stay Meetings, dates discussed:    Additional Comments:  Pollie Friar, RN 01/07/2016, 3:22 PM

## 2016-01-07 NOTE — Progress Notes (Signed)
STAT EEG Completed; Results Pending   

## 2016-01-07 NOTE — Telephone Encounter (Signed)
Rn call patients wife Isaiah [name] about her husband being in the hospital. Rn stated Dr.Sethi was sent the message that he was admitted to [place] verbalized understanding.

## 2016-01-07 NOTE — Progress Notes (Signed)
Pt repeatedly saying "cut it off" as well as repeating other phrases during tx pt unable to elaborate on phrases and not following verbal commands. tx ended 1.5 hrs early per dr Mercy Moore and at attending md request.  Hr and bp elevated during tx increasingly during las hr of tx. After tx ended pt appeared more calm hr decreased to 106 bpm and bp at 148/88.

## 2016-01-07 NOTE — Progress Notes (Signed)
Inpatient Diabetes Program Recommendations  AACE/ADA: New Consensus Statement on Inpatient Glycemic Control (2015)  Target Ranges:  Prepandial:   less than 140 mg/dL      Peak postprandial:   less than 180 mg/dL (1-2 hours)      Critically ill patients:  140 - 180 mg/dL   Review of Glycemic Control HYPOglycemic this morning after Lantus 13 given last pm. Inpatient Diabetes Program Recommendations:  Insulin - Basal: reduce Lantus to 5 units  Thank you  Raoul Pitch BSN, RN,CDE Inpatient Diabetes Coordinator [phone] (team pager)

## 2016-01-07 NOTE — Consult Note (Signed)
Renal Service Consult Note [facility]  [place] 01/07/2016 [place] D Requesting Physician:  Dr Charlies Silvers  Reason for Consult:  ESRD pt with new seizures and AMS HPI: The patient is a 50 y.o. year-old with hx of ESRD started dialysis Jan 2017.   Had AVF placed 3/28.  Had big CVA in Jan 2017 w R hemiparesis and aphasia.  Improved and was walking about the house at home w/o assistance 2 wks ago.  Yesterday EMS called to house for possible seizure.  EMS found pt with R facial droop and R sided weakness. Wife described seizure that lasted second then was unresponsive for about a minute.  In ED pt was found to have subtle clonic RLE movements and dec'd MS, he was given IV ativan by neurology and R leg movements stopped and MS improved.  Pt admitted. MRI done which showed old strokes/ bleeds from Jan 2017 admission, nothing new.  Asked to see for ESRD.  Patient w pmh DM, sickle-thal dz, DVT / PE x 2 in Nov 2012 and again in Jan 2016, MGUS, anemia and CKD. F/B Dr Eldridge Scot. He was off coumadin due to GIB. 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 However then developed multifocal embolic strokes causing aphasia and R hemiparesis. TTE was + for PFO, heparin changed to lovenox. Then transferred here for rehab admit in Feb 2017. Was started on dialysis around this time too. Neuro noted that decision making was very complex for this patient w PFO, recurrent DVT"s/ PE, hx GIB's.  They recommended in Feb 2017 to resume coumadin and work towards closure of the PFO.    Pt's wife provides hist today.  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  He has an apartment there and lives there part-time, and in [place] the rest of the time.  Lives w his wife and their only child who is in high school, last year.  No  etoh or tobacco.  Patient went to school at [employer] and then [employer], has a Oceanographer in psychology .   Patient was started on 2 new medications recently , Sensipar and Baclofen.  The baclofen was started on 4/26, scheduled tid dosing.     ROS  denies CP  no joint pain   no HA  no blurry vision  no rash  no diarrhea  no nausea/ vomiting  no dysuria  no difficulty voiding  no change in urine color    Past Medical History  Past Medical History  Diagnosis Date  . Diabetes mellitus without complication ([place])   . Sickle cell-thalassemia disease ([place])     a. Sickle cell trait.  . Pulmonary embolism ([place]) 07/2011    a. Tx with Coumadin for 6 months (unknown cause per patient).  . Hyperkalemia 07/2011  . Acute renal failure ([place]) 07/2011  . Monoclonal gammopathy   . Sleep apnea     a. uses CPAP.  [name] Hypertension   . Gout   . Pulmonary embolism ([place]) 07/2011; 09/27/2014    a. Bilat PE 07/2011 - unclear cause, tx with 6 months Coumadin.;   . Monoclonal gammopathies   . Anemia   . Asthma   . OSA on CPAP   . History of recent blood transfusion 10/27/14    2 Units PRBC's  .  ESRD (end stage renal disease) on dialysis ([place])   . Stroke ([place])   . A-fib [facility])    Past Surgical History  Past Surgical History  Procedure Laterality Date  . Cholecystectomy    . Bone marrow biopsy    . Other surgical history      Retinal surgery  . Pars plana vitrectomy  02/17/2012    Procedure: PARS PLANA VITRECTOMY WITH 25 GAUGE;  Surgeon: Hayden Pedro, MD;  Location: [place];  Service: Ophthalmology;  Laterality: Right;  . Cholecystectomy  1990's?  . Eye surgery Right   . Insertion of dialysis catheter Right 10/16/2015    Procedure: INSERTION OF PALINDROME DIALYSIS CATHETER ;  Surgeon: Elam Dutch, MD;  Location: [place];  Service: Vascular;  Laterality: Right;  . Av fistula placement Right 12/05/2015    Procedure: INSERTION OF ARTERIOVENOUS (AV) GORE-TEX GRAFT ARM;  Surgeon: Elam Dutch, MD;   Location: [facility] OR;  Service: Vascular;  Laterality: Right;   Family History  Family History  Problem Relation Age of Onset  . Hypertension Mother   . Diabetes Mother   . Sickle cell anemia Brother   . Diabetes type I Brother   . Kidney disease Brother   . Stroke Father    Social History  reports that he has never smoked. He has never used smokeless tobacco. He reports that he does not drink alcohol or use illicit drugs. Allergies  Allergies  Allergen Reactions  . Codeine Rash and Other (See Comments)    Unknown reaction (patient says it was more serious than just a rash, but he can't remember what happened)   Home medications Prior to Admission medications   Medication Sig Start Date End Date Taking? Authorizing Provider  aspirin EC 81 MG tablet Take 1 tablet (81 mg total) by mouth daily. 11/02/15  Yes Ivan Anchors Love, PA-C  baclofen (LIORESAL) 10 MG tablet Take 1 tablet (10 mg total) by mouth 3 (three) times daily. Start with 1/2 tablet three times daily x 2 weeks then one tablet three times daily Patient taking differently: Take 10 mg by mouth 3 (three) times daily.  01/01/16  Yes Garvin Fila, MD  cetirizine (ZYRTEC) 10 MG tablet Take 10 mg by mouth daily as needed for allergies.    Yes Historical Provider, MD  epoetin alfa (EPOGEN,PROCRIT) 41937 UNIT/ML injection Inject 5,000 Units into the skin once a week.  09/06/15  Yes Historical Provider, MD  fluticasone furoate-vilanterol (BREO ELLIPTA) 100-25 MCG/INH AEPB Inhale 1 puff into the lungs daily. 11/02/15  Yes Ivan Anchors Love, PA-C  Insulin Glargine (LANTUS) 100 UNIT/ML Solostar Pen Inject 25 Units into the skin daily at 10 pm. 11/02/15  Yes Ivan Anchors Love, PA-C  midodrine (PROAMATINE) 2.5 MG tablet Take 1 tablet (2.5 mg total) by mouth 2 (two) times daily with a meal. 11/02/15  Yes Ivan Anchors Love, PA-C  multivitamin (RENA-VIT) TABS tablet Take 1 tablet by mouth at bedtime. 11/02/15  Yes Pamela S Love, PA-C  NEEDLE, DISP, 27 G (BD DISP  NEEDLES) 27G X 1/2" MISC 1 application by Does not apply route daily after supper. 11/02/15  Yes Ivan Anchors Love, PA-C  pantoprazole (PROTONIX) 40 MG tablet Take 1 tablet (40 mg total) by mouth 2 (two) times daily. 11/02/15  Yes Ivan Anchors Love, PA-C  SENSIPAR 60 MG tablet Take 60 mg by mouth daily. 12/27/15  Yes Historical Provider, MD  traMADol (ULTRAM) 50 MG tablet Take 1 tablet (50 mg total)  by mouth every 6 (six) hours as needed for moderate pain. 12/10/15  Yes Ulyses Amor, PA-C  warfarin (COUMADIN) 5 MG tablet Take 1.5 tablets (7.5 mg total) by mouth daily at 6 PM. 11/02/15  Yes Bary Leriche, PA-C   Liver Function Tests  Recent Labs Lab 01/01/16 1027 01/06/16 1144  AST 15 30  ALT 9 14*  ALKPHOS 104 113  BILITOT 1.71* 1.2  PROT 8.7*  8.0 8.0  ALBUMIN 3.6 3.3*   No results for input(s): LIPASE, AMYLASE in the last 168 hours. CBC  Recent Labs Lab 01/01/16 1027 01/06/16 1144 01/06/16 1153 01/07/16 0500  WBC 20.2* 20.4*  --  18.3*  NEUTROABS 15.6* 13.3*  --   --   HGB 9.2* 9.2* 11.2* 9.2*  HCT 25.4* 25.3* 33.0* 25.1*  MCV 79.6 80.1  --  79.4  PLT 588* 420*  --  016*   Basic Metabolic Panel  Recent Labs Lab 01/01/16 1027 01/06/16 1144 01/06/16 1153  NA 134* 138 139  K 3.0* 2.9* 2.9*  CL  --  102 101  CO2 25 15*  --   GLUCOSE 81 192* 181*  BUN 18.5 35* 36*  CREATININE 4.7* 7.31* 7.30*  CALCIUM 8.9 7.4*  --     Filed Vitals:   01/07/16 0201 01/07/16 0640 01/07/16 1124 01/07/16 1128  BP: 123/91 121/84 122/82   Pulse: 98 83  96  Temp: 98.8 F (37.1 C) 97.5 F (36.4 C) 98 F (36.7 C)   TempSrc: Axillary Oral Oral   Resp: 22 18 18 16   Height:      Weight:      SpO2: 100% 100% 100% 100%   Exam Pt is confused, slurred speech, sounds drunk-like, R facial droop, drooling No rash, cyanosis or gangrene Sclera anicteric, throat clear  No jvd or bruits Chest occ rhonchi, no rales or wheezing RRR no MRG Abd soft ntnd no mass or ascites +bs obese GU normal male  foley in place MS no joint effusions or deformity Ext no LE edema / no wounds or ulcers Neuro R facial droop, grips bilat 5/5 on L and 3/5 on L   Dialysis:  [facility] TTS  4h 46mn  89kg  2/2.25 bath  RFA AVF maturing (3/28)/ R IJ cath  Venofer 50/ wk Mircera 100 q 2, last 4/19 Calc 0.5 ug   Assessment: 1 Seizures, new onset / AMS - could be baclofen neurotoxicity, started on week ago and can causeASM/ intoxication-like behavior, as well as seizures I suspect. well described in ESRD pts.  Is cleared well with dialysis. Plan 4 hr HD today asap. 2 ESRD 3 Hx DVT/ PE 4 Hx CVA's, prob related to PFO 5 Hx afib 6 Hx GIB 7 Obesity   Plan - HD today off schedule, small UF, see if helps mental status  RKelly SplinterMD [facility] pager [phone]   cell [phone]/09/2015, 2:27 PM

## 2016-01-07 NOTE — Progress Notes (Signed)
Hypoglycemic Event  CBG: 42  Treatment: 4 oz orange juice with 2 packs of sugar Symptoms: None  Follow-up CBG: pending CBG Result: pending  Possible Reasons for Event: Poor oral intake.    Isaiah [name] N

## 2016-01-07 NOTE — Progress Notes (Signed)
Social Visit with patient`s wife and updated her about his plan of care. No new stroke on MRI.Continue vimpat for seizures. F/u as outpatient with me after discharge. Neurohospitalist team to follow while in hospital.

## 2016-01-07 NOTE — Progress Notes (Signed)
CSW consulted regarding Medicaid application. CSW provided Medicaid application to patient's wife.  CSW signing off.  Percell Locus Kemper Heupel LCSWA [phone]

## 2016-01-07 NOTE — Progress Notes (Signed)
[place] for Warfarin Indication: pulmonary embolus - history  Allergies  Allergen Reactions  . Codeine Rash and Other (See Comments)    Unknown reaction (patient says it was more serious than just a rash, but he can't remember what happened)   Patient Measurements: Height: 5' 10"  (177.8 cm) Weight: 200 lb (90.719 kg) IBW/kg (Calculated) : 73  Vital Signs: Temp: 98 F (36.7 C) (05/01 1124) Temp Source: Oral (05/01 1124) BP: 122/82 mmHg (05/01 1124) Pulse Rate: 96 (05/01 1128)  Labs:  Recent Labs  01/06/16 1144 01/06/16 1153 01/07/16 0500  HGB 9.2* 11.2* 9.2*  HCT 25.3* 33.0* 25.1*  PLT 420*  --  539*  APTT 41*  --   --   LABPROT 26.9*  --  28.7*  INR 2.53*  --  2.75*  CREATININE 7.31* 7.30*  --    Estimated Creatinine Clearance: 13.4 mL/min (by C-G formula based on Cr of 7.3).   Assessment: 50yo male with multiple medical problems including history of bilateral PE.  He is on chronic Warfarin therapy for this.  He is admitted today for possible CVA but did not qualify for TPA.  We have been asked to resume his Warfarin therapy.   He is without noted bleeding complications.  Home Dose:  Warfarin 7.47m daily  INR today within therapeutic range  Goal of Therapy:  INR 2-3 Monitor platelets by anticoagulation protocol: Yes   Plan:  -Continue home regimen of Warfarin 7.516mdaily - PT/INR daily  Thank you LiAnette GuarneriPharmD [phone]/09/2015,11:34 AM

## 2016-01-07 NOTE — Evaluation (Signed)
Speech Language Pathology Evaluation Patient Details Name: Isaiah [name] MRN: [id number] DOB: 07/07/1966 Today's Date: 01/07/2016 Time: 1120-1145 SLP Time Calculation (min) (ACUTE ONLY): 25 min  Problem List:  Patient Active Problem List   Diagnosis Date Noted  . Seizure (HCC) 01/06/2016  . Tongue wound 01/06/2016  . Hypokalemia 01/06/2016  . A-fib (HCC)   . ESRD (end stage renal disease) (HCC) 12/03/2015  . PFO (patent foramen ovale) 11/06/2015  . CVA (cerebral infarction)   . ESRD (end stage renal disease) on dialysis (HCC)   . S/P dialysis catheter insertion (HCC)   . Acute deep vein thrombosis (DVT) of popliteal vein of left lower extremity (HCC)   . Leukocytosis   . Labile blood glucose   . Acute blood loss anemia   . Hemiparesis, aphasia, and dysphagia as late effects of cerebrovascular accident (HCC) 10/13/2015  . Gait disturbance, post-stroke 10/13/2015  . Embolic stroke (HCC) 10/12/2015  . Plasma cell disorder   . Anemia 10/31/2014  . Long term current use of anticoagulant therapy 10/31/2014  . Renal insufficiency 10/31/2014  . Iron deficiency anemia due to chronic blood loss 10/30/2014  . Encounter for therapeutic drug monitoring 10/11/2014  . Pulmonary embolus (HCC) 09/27/2014  . ESRD on dialysis (HCC) 09/27/2014  . MGUS (monoclonal gammopathy of unknown significance) 09/27/2014  . OSA on CPAP 09/27/2014  . Diabetic retinopathy of both eyes (HCC) 02/03/2012  . Sickle cell trait (HCC) 07/19/2011  . Morbid obesity (HCC) 07/19/2011  . PE (pulmonary embolism) 07/18/2011  . SOB (shortness of breath) 07/18/2011  . Hyperkalemia 07/18/2011  . ARF (acute renal failure) (HCC) 07/18/2011  . Leukocytosis, unspecified 07/18/2011  . DM (diabetes mellitus) type II controlled with renal manifestation (HCC) 07/18/2011  . Cardiac enzymes elevated 07/18/2011  . Metabolic acidosis 07/18/2011  . HTN (hypertension) 07/18/2011   Past Medical History:  Past Medical History   Diagnosis Date  . Diabetes mellitus without complication (HCC)   . Sickle cell-thalassemia disease (HCC)     a. Sickle cell trait.  . Pulmonary embolism (HCC) 07/2011    a. Tx with Coumadin for 6 months (unknown cause per patient).  . Hyperkalemia 07/2011  . Acute renal failure (HCC) 07/2011  . Monoclonal gammopathy   . Sleep apnea     a. uses CPAP.  . Hypertension   . Gout   . Pulmonary embolism (HCC) 07/2011; 09/27/2014    a. Bilat PE 07/2011 - unclear cause, tx with 6 months Coumadin.;   . Monoclonal gammopathies   . Anemia   . Asthma   . OSA on CPAP   . History of recent blood transfusion 10/27/14    2 Units PRBC's  . ESRD (end stage renal disease) on dialysis (HCC)   . Stroke (HCC)   . A-fib (HCC)    Past Surgical History:  Past Surgical History  Procedure Laterality Date  . Cholecystectomy    . Bone marrow biopsy    . Other surgical history      Retinal surgery  . Pars plana vitrectomy  02/17/2012    Procedure: PARS PLANA VITRECTOMY WITH 25 GAUGE;  Surgeon: John D Matthews, MD;  Location: [facility] OR;  Service: Ophthalmology;  Laterality: Right;  . Cholecystectomy  1990's?  . Eye surgery Right   . Insertion of dialysis catheter Right 10/16/2015    Procedure: INSERTION OF PALINDROME DIALYSIS CATHETER ;  Surgeon: Charles E Fields, MD;  Location: [facility] OR;  Service: Vascular;  Laterality: Right;  . Av fistula placement Right   12/05/2015    Procedure: INSERTION OF ARTERIOVENOUS (AV) GORE-TEX GRAFT ARM;  Surgeon: Charles E Fields, MD;  Location: [facility] OR;  Service: Vascular;  Laterality: Right;   HPI:  50-year-old male with end-stage renal disease, hypertension, diabetes, recently had a stroke with residual right-sided weakness, admitted with seizure-like activity witnessed by his wife. Patient has been having a stroke at the beginning of this year, has an inpatient followed by outpatient rehabilitation and has been doing well recently. Pt had a decline in am on 5/1 and MRI si being  repeated. Pt has a history of dysarthria from prior CVA and was being seen at OP SLP, but no history of dysphagia.    Assessment / Plan / Recommendation Clinical Impression  Pt demosntrates significant change from baseline function as documented by OP SLP. Pt today demonstrates intermittent ability to focus attention to speakers and environment; responsive to tasks and cues in 50% of trials. Verbally he is perseverative, repeating words he has heard recently. SLP was able to redirect with moderate to max verbal cues x3, but pt quickly resumes perseveration. Pt was able to particiapte briefly in tasks such as self feeding and followed 2/4 one step commands with left UE. He becausme more lethargic as assessment progressed. Will need diagnositc f/u. For now pt is significantly impaired and may benefit from f/u at CIR depending on progress. SLP will follow acutely for basic cognitive lingusitc tasks.     SLP Assessment  All further Speech Lanaguage Pathology  needs can be addressed in the next venue of care    Follow Up Recommendations  Outpatient SLP;Inpatient Rehab    Frequency and Duration min 2x/week  2 weeks      SLP Evaluation Prior Functioning  Cognitive/Linguistic Baseline: Baseline deficits Baseline deficit details: dysarthria, motor planning seeing OP SLP. Pt was at conversation level, language WNL, Mild memory deficits.   Type of Home: Apartment  Lives With: Family Available Help at Discharge: Available 24 hours/day;Family   Cognition  Overall Cognitive Status: Impaired/Different from baseline Arousal/Alertness: Lethargic Orientation Level: Oriented to person;Disoriented to place;Disoriented to time;Disoriented to situation Attention: Focused;Sustained Focused Attention: Impaired Focused Attention Impairment: Verbal basic;Functional basic Sustained Attention: Impaired Sustained Attention Impairment: Verbal basic;Functional basic Memory:  (unable to assess) Awareness:  Impaired Awareness Impairment: Intellectual impairment Problem Solving: Impaired Problem Solving Impairment: Functional basic Behaviors: Perseveration    Comprehension  Auditory Comprehension Overall Auditory Comprehension: Impaired Yes/No Questions: Not tested Commands: Impaired One Step Basic Commands: 50-74% accurate Interfering Components: Attention EffectiveTechniques: Extra processing time;Increased volume;Repetition Visual Recognition/Discrimination Discrimination: Not tested Reading Comprehension Reading Status: Not tested    Expression Expression Primary Mode of Expression: Verbal Verbal Expression Overall Verbal Expression: Impaired Initiation: No impairment Automatic Speech: Name Level of Generative/Spontaneous Verbalization: Word Repetition: No impairment Naming: Impairment Confrontation: Impaired Verbal Errors: Perseveration Interfering Components: Attention Written Expression Dominant Hand: Right (has been using left, but cant today) Written Expression: Not tested   Oral / Motor  Oral Motor/Sensory Function Overall Oral Motor/Sensory Function: Mild impairment Facial ROM: Reduced right;Suspected CN VII (facial) dysfunction Facial Symmetry: Abnormal symmetry right;Suspected CN VII (facial) dysfunction Facial Strength: Reduced right;Suspected CN VII (facial) dysfunction Lingual ROM: Other (Comment) (pt bit tongue on the left, ? deviation to left, pt cant prot) Lingual Symmetry: Abnormal symmetry left Lingual Strength: Reduced Motor Speech Overall Motor Speech: Impaired at baseline   GO                    , MA CCC-SLP [phone]    Karsten Howry, Katherene Ponto 01/07/2016, 2:52 PM

## 2016-01-07 NOTE — Progress Notes (Signed)
Pt wife has asserted that no nursing staff has been in to see her or patient since she arrived at ~9am. CNA and RN have both been to room on multiple occasions to assess pt and to speak with family. Wife stated she had "spoken" to Dr Lenell Antu' staff on her way to room, however dr Leonie Man denies having pt under care on this visit. Informed wife, informed her that hosp neurology had been contacted re pt current condition. Slurred speech, repetitive. Etta Quill in to see pt, concerned regarding extension of condition. Currently in xray for MRI. Wife alone in room.

## 2016-01-07 NOTE — Procedures (Signed)
        Electroencephalogram Procedure Note  [name] [facility] Date of Birth:  [birth year] Medical Record Number:  [id number]   Indications: Diagnostic Date of Procedure  01/07/2016 Medications: vimpat and ativan Clinical history : 50 year male with strokes and new onset seizure Technical Description  This study was performed using standard digital electroencephalographic recording equipment. International 10-20 electrode placement was used. The record was obtained with the patient drowsy and asleep.  The record is of adequate technical quality for purposes of interpretation.   Activation Procedures:  none . EEG Description Awake:  Clear background cardiac rhythm is noted. The background consists of diffuse 5-6 hertz., which is admixed with intermittent   3-4 Hz delta slowing in a  diffuse and symmetric fashion.No paroxsymal activity or spikes  are noted.Intermittent bifrontal and temporal isolated sharp waves are noted but without definite epileptiform activity. Technical component of study is Acceptable but there are frequent movement artifacts.  Sleep: Patient is drowsy and sleepy throughout the recording but no clear-cut stages of sleep aren't noted.  Result of Activation Procedures: Hyperventilation: N/A. Photo Stimulation: N/A.  Summary This is a severely abnormal EEG due to the presence of Severe background slowing indicative of bihemispheric dysfunction  which is a nonspecific finding   seen in a variety of toxic, metabolic or  hypoxic etiologies. No definite ongoing seizure activity is noted

## 2016-01-07 NOTE — Progress Notes (Addendum)
Patient ID: Quinby, male   DOB: November 30, 1965, 50 y.o.   MRN: [id number]  PROGRESS NOTE    [place]  CWC:[id number] DOB: 1965/10/27 DOA: 01/06/2016  PCP: Foye Spurling, MD  Outpatient Specialists: Neurology, Dr. Antony Contras   Brief Narrative:  50 year old male with past medical history significant for end-stage renal disease on hemodialysis MWF, diabetes, hypertension, atrial fibrillation on anticoagulation with Coumadin, recent stroke with residual right-sided weakness. He presented to [facility] with seizure-like activity and worsening mental status per patient's wife at the bedside.  On admission, patient was hemodynamically stable. Blood work was notable for leukocytosis of 20.4, hemoglobin 9.2, potassium 2.9, creatinine 7.31, INR 2.53. CT head showed no acute intracranial findings. MRI findings showed diffusion only MRI without definitive acute infarct. Patient does have multiple late subacute to chronic infarcts bilaterally. Posterior left frontal diffusion abnormality could potentially reflect sequela of recent seizure activity however superimposed acute ischemia cannot be excluded. Patient has proximal left M2 branch stenosis with progressive attenuation of more distal left MCA branch vessels, new moderate to severe left P1 PCA stenosis. Neurology has seen the patient in consultation.   Assessment & Plan:   Principal Problem:   Acute encephalopathy / seizures - Unclear etiology of new mental status changes. Per patient's wife at the bedside, she says this is new. Patient is disoriented. - The differential includes seizures, new stroke - MRI showed possible seizure activity versus superimposed acute ischemia - He is on lacosamide 100 mg twice daily - Appreciate neurology input - Continue to monitor mental status  Active Problems:   End-stage renal disease on hemodialysis - Appreciate renal following - Hemodialysis MWF    DM (diabetes mellitus) type II  controlled with renal manifestation on current long-term insulin use (HCC) - Last A1c 12/03/2015 was 6.4, indicating good glycemic control - Continue Lantus 13 units at bedtime along with sliding scale insulin    HTN (hypertension) / hypotension - Continue Midrin 2.5 mg twice daily      Hypokalemia - Due to end-stage renal disease - Scheduled for hemodialysis today - We will give low-dose potassium 20 mEq today     Leukocytosis - Unclear etiology. Chest x-ray showed no acute cardiopulmonary findings - Patient was not started on antibiotics at the time of the admission - White blood cell count is improving, we'll continue to monitor    Anemia of chronic renal disease - Hemoglobin stable at 9.2   DVT prophylaxis: On anticoagulation with Coumadin Code Status: full code  Family Communication: Patient's wife at the bedside Disposition Plan: Unable to determine level patient be ready for discharge, at present he is confused, disoriented. Anticipate he will stay in hospital at least for next 2 days for evaluation.   Consultants:   Neurology  Procedures:   None  Antimicrobials:   None    Subjective: Patient disoriented, keeps saying Sunday. He however is able to follow simple commands.  Objective: Filed Vitals:   01/06/16 1742 01/06/16 2158 01/07/16 0201 01/07/16 0640  BP: 111/80 114/96 123/91 121/84  Pulse: 89 102 98 83  Temp: 99 F (37.2 C) 98.3 F (36.8 C) 98.8 F (37.1 C) 97.5 F (36.4 C)  TempSrc: Oral Axillary Axillary Oral  Resp: 18 16 22 18   Height:      Weight:      SpO2: 100% 100% 100% 100%   No intake or output data in the 24 hours ending 01/07/16 0659 Filed Weights   01/06/16 1155 01/06/16 1414  Weight: 92.987 kg (205 lb) 90.719 kg (200 lb)    Examination:  General exam: Appears calm and comfortable  Respiratory system: Clear to auscultation. Respiratory effort normal. Cardiovascular system: S1 & S2 heard, RRR. No JVD Gastrointestinal system:  Abdomen is nondistended, soft and nontender. No organomegaly or masses felt. Normal bowel sounds heard. Central nervous system: Right upper extremity weakness otherwise nonfocal deficit Extremities: Patient able to lift lower extremities. Right upper extremity weakness noted. Able to lift left upper extremity Skin: No rashes, lesions or ulcers Psychiatry: Disoriented, mood is normal, no agitation or restlessness  Data Reviewed: I have personally reviewed following labs and imaging studies  CBC:  Recent Labs Lab 01/01/16 1027 01/06/16 1144 01/06/16 1153 01/07/16 0500  WBC 20.2* 20.4*  --  18.3*  NEUTROABS 15.6* 13.3*  --   --   HGB 9.2* 9.2* 11.2* 9.2*  HCT 25.4* 25.3* 33.0* 25.1*  MCV 79.6 80.1  --  79.4  PLT 588* 420*  --  099*   Basic Metabolic Panel:  Recent Labs Lab 01/01/16 1027 01/06/16 1144 01/06/16 1153  NA 134* 138 139  K 3.0* 2.9* 2.9*  CL  --  102 101  CO2 25 15*  --   GLUCOSE 81 192* 181*  BUN 18.5 35* 36*  CREATININE 4.7* 7.31* 7.30*  CALCIUM 8.9 7.4*  --    GFR: Estimated Creatinine Clearance: 13.4 mL/min (by C-G formula based on Cr of 7.3). Liver Function Tests:  Recent Labs Lab 01/01/16 1027 01/06/16 1144  AST 15 30  ALT 9 14*  ALKPHOS 104 113  BILITOT 1.71* 1.2  PROT 8.7*  8.0 8.0  ALBUMIN 3.6 3.3*   No results for input(s): LIPASE, AMYLASE in the last 168 hours. No results for input(s): AMMONIA in the last 168 hours. Coagulation Profile:  Recent Labs Lab 01/06/16 1144 01/07/16 0500  INR 2.53* 2.75*   Cardiac Enzymes: No results for input(s): CKTOTAL, CKMB, CKMBINDEX, TROPONINI in the last 168 hours. BNP (last 3 results) No results for input(s): PROBNP in the last 8760 hours. HbA1C: No results for input(s): HGBA1C in the last 72 hours. CBG:  Recent Labs Lab 01/06/16 1613 01/06/16 2156  GLUCAP 132* 103*   Lipid Profile: No results for input(s): CHOL, HDL, LDLCALC, TRIG, CHOLHDL, LDLDIRECT in the last 72 hours. Thyroid  Function Tests:  Recent Labs  01/06/16 1803  TSH 0.896   Anemia Panel: No results for input(s): VITAMINB12, FOLATE, FERRITIN, TIBC, IRON, RETICCTPCT in the last 72 hours. Urine analysis:    Component Value Date/Time   COLORURINE YELLOW 12/03/2015 [place] 12/03/2015 1746   LABSPEC 1.009 12/03/2015 1746   PHURINE 5.5 12/03/2015 1746   GLUCOSEU 100* 12/03/2015 1746   HGBUR NEGATIVE 12/03/2015 1746   BILIRUBINUR NEGATIVE 12/03/2015 1746   KETONESUR NEGATIVE 12/03/2015 1746   PROTEINUR 100* 12/03/2015 1746   UROBILINOGEN 0.2 10/01/2014 1124   NITRITE NEGATIVE 12/03/2015 1746   LEUKOCYTESUR NEGATIVE 12/03/2015 1746   Sepsis Labs: @LABRCNTIP (procalcitonin:4,lacticidven:4)   ) Recent Results (from the past 240 hour(s))  TECHNOLOGIST REVIEW     Status: None   Collection Time: 01/01/16 10:27 AM  Result Value Ref Range Status   Technologist Review few teardrops, ovalos, targets. few large plts.  Final      Radiology Studies: Ct Head Wo Contrast 01/06/2016  1. No acute intracranial abnormalities. 2. Age advanced brain atrophy. 3. Chronic areas of encephalomalacia noted bilaterally. Electronically Signed   By: Kerby Moors M.D.   On:  01/06/2016 12:03   Mr Jodene Nam Head Wo Contrast 01/06/2016 1. Diffusion only MRI without definite acute infarct identified. Multiple late subacute to chronic infarcts bilaterally as above with associated chronic blood products accounting for diffusion abnormality. Posterior left frontal diffusion abnormality could also potentially reflect the sequelae of recent seizure activity, though a trace amount of superimposed acute ischemia cannot be excluded. 2. Motion degraded MRA without emergent large vessel occlusion. 3. Proximal left M2 branch stenosis (with apparent improved patency compared to prior outside MRA) with progressive attenuation of more distal left MCA branch vessels. 4. New moderate to severe left P1 PCA stenoses, with assessment  limited by motion artifact. Study was reviewed in person with Dr. Leonel Ramsay on 01/06/2016 at 12:30 p.m. Electronically Signed   By: Logan Bores M.D.   On: 01/06/2016 13:53   Dg Chest Port 1 View 01/06/2016  No active disease. Electronically Signed   By: Skipper Cliche M.D.   On: 01/06/2016 15:03   Mr Brain Ltd W/o Cm 01/06/2016   1. Diffusion only MRI without definite acute infarct identified. Multiple late subacute to chronic infarcts bilaterally as above with associated chronic blood products accounting for diffusion abnormality. Posterior left frontal diffusion abnormality could also potentially reflect the sequelae of recent seizure activity, though a trace amount of superimposed acute ischemia cannot be excluded. 2. Motion degraded MRA without emergent large vessel occlusion. 3. Proximal left M2 branch stenosis (with apparent improved patency compared to prior outside MRA) with progressive attenuation of more distal left MCA branch vessels. 4. New moderate to severe left P1 PCA stenoses, with assessment limited by motion artifact. Study was reviewed in person with Dr. Leonel Ramsay on 01/06/2016 at 12:30 p.m. Electronically Signed   By: Logan Bores M.D.   On: 01/06/2016 13:53    Scheduled Meds: . aspirin EC  81 mg Oral Daily  . baclofen  10 mg Oral TID  . cinacalcet  60 mg Oral Q breakfast  . fluticasone furoate-vilanterol  1 puff Inhalation Daily  . insulin aspart  0-5 Units Subcutaneous QHS  . insulin aspart  0-9 Units Subcutaneous TID WC  . insulin glargine  13 Units Subcutaneous QHS  . lacosamide  100 mg Oral BID  . midodrine  2.5 mg Oral BID WC  . pantoprazole  40 mg Oral BID  . sodium chloride flush  3 mL Intravenous Q12H  . Warfarin - Pharmacist Dosing Inpatient   Does not apply q1800   Continuous Infusions:      Time spent: 25 minutes    Leisa Lenz, MD [facility] Pager [phone]  If 7PM-7AM, please contact night-coverage [email or web address] Password  [email or web address] 01/07/2016, 6:59 AM

## 2016-01-07 NOTE — Progress Notes (Signed)
Hypoglycemic Event  CBG: 42 Treatment: 4 oz OJ with 2 packs of sugar Symptoms: None Follow-up CBG: Time:0743 CBG Result: 113 Possible Reasons for Event: Poor oral intake     Isaiah [name] N

## 2016-01-07 NOTE — Evaluation (Signed)
Clinical/Bedside Swallow Evaluation Patient Details  Name: Isaiah [name] [name] MRN: [id number] Date of Birth: 03/02/1966  Today's Date: 01/07/2016 Time: SLP Start Time (ACUTE ONLY): 1120 SLP Stop Time (ACUTE ONLY): 1145 SLP Time Calculation (min) (ACUTE ONLY): 25 min  Past Medical History:  Past Medical History  Diagnosis Date  . Diabetes mellitus without complication ([place])   . Sickle cell-thalassemia disease ([place])     a. Sickle cell trait.  . Pulmonary embolism ([place]) 07/2011    a. Tx with Coumadin for 6 months (unknown cause per patient).  . Hyperkalemia 07/2011  . Acute renal failure ([place]) 07/2011  . Monoclonal gammopathy   . Sleep apnea     a. uses CPAP.  [name] Hypertension   . Gout   . Pulmonary embolism ([place]) 07/2011; 09/27/2014    a. Bilat PE 07/2011 - unclear cause, tx with 6 months Coumadin.;   . Monoclonal gammopathies   . Anemia   . Asthma   . OSA on CPAP   . History of recent blood transfusion 10/27/14    2 Units PRBC's  . ESRD (end stage renal disease) on dialysis ([place])   . Stroke ([place])   . A-fib [name])    Past Surgical History:  Past Surgical History  Procedure Laterality Date  . Cholecystectomy    . Bone marrow biopsy    . Other surgical history      Retinal surgery  . Pars plana vitrectomy  02/17/2012    Procedure: PARS PLANA VITRECTOMY WITH 25 GAUGE;  Surgeon: Hayden Pedro, MD;  Location: [facility];  Service: Ophthalmology;  Laterality: Right;  . Cholecystectomy  1990's?  . Eye surgery Right   . Insertion of dialysis catheter Right 10/16/2015    Procedure: INSERTION OF PALINDROME DIALYSIS CATHETER ;  Surgeon: Elam Dutch, MD;  Location: [facility];  Service: Vascular;  Laterality: Right;  . Av fistula placement Right 12/05/2015    Procedure: INSERTION OF ARTERIOVENOUS (AV) GORE-TEX GRAFT ARM;  Surgeon: Elam Dutch, MD;  Location: [facility] OR;  Service: Vascular;  Laterality: Right;   HPI:  50 year old male with end-stage renal disease, hypertension, diabetes, recently  had a stroke with residual right-sided weakness, admitted with seizure-like activity witnessed by his wife. Patient has been having a stroke at the beginning of this year, has an inpatient followed by outpatient rehabilitation and has been doing well recently. Pt had a decline in am on 5/1 and MRI si being repeated. Pt has a history of dysarthria from prior CVA and was being seen at OP SLP, but no history of dysphagia.    Assessment / Plan / Recommendation Clinical Impression  Pt demonstrated impaired swallow, likely due to increased lethargy and confusion with sluggish responsiveness. SLP utilized max verbal and tactile cues to encourage pt attention to PO trials with intermittent success. Oral dyspahgia observed with solid textures and thins followed by consistent throat clearing, suspect delayed swallow. Overall, impairment is mild and likely related to lethargy. Recommend pt downgrade diet to puree and nectar thick liquids for safety; will f/u for tolerance and upgrade as arousal improves.     Aspiration Risk  Mild aspiration risk    Diet Recommendation Dysphagia 1 (Puree);Nectar-thick liquid   Liquid Administration via: Straw;Cup Medication Administration: Whole meds with puree Supervision: Full supervision/cueing for compensatory strategies Compensations: Slow rate;Small sips/bites    Other  Recommendations     Follow up Recommendations  Outpatient SLP;Inpatient Rehab    Frequency and Duration min 2x/week  2 weeks  Prognosis Prognosis for Safe Diet Advancement: Good Barriers to Reach Goals: Cognitive deficits      Swallow Study   General HPI: 50 year old male with end-stage renal disease, hypertension, diabetes, recently had a stroke with residual right-sided weakness, admitted with seizure-like activity witnessed by his wife. Patient has been having a stroke at the beginning of this year, has an inpatient followed by outpatient rehabilitation and has been doing well  recently. Pt had a decline in am on 5/1 and MRI si being repeated. Pt has a history of dysarthria from prior CVA and was being seen at OP SLP, but no history of dysphagia.  Type of Study: Bedside Swallow Evaluation Diet Prior to this Study: Regular;Thin liquids Temperature Spikes Noted: No Respiratory Status: Room air History of Recent Intubation: No Behavior/Cognition: Lethargic/Drowsy;Distractible;Requires cueing Oral Care Completed by SLP: No Oral Cavity - Dentition: Adequate natural dentition Vision: Impaired for self-feeding Self-Feeding Abilities: Needs assist Patient Positioning: Upright in bed Baseline Vocal Quality: Normal Volitional Cough: Cognitively unable to elicit Volitional Swallow: Unable to elicit    Oral/Motor/Sensory Function Overall Oral Motor/Sensory Function: Mild impairment Facial ROM: Reduced right;Suspected CN VII (facial) dysfunction Facial Symmetry: Abnormal symmetry right;Suspected CN VII (facial) dysfunction Facial Strength: Reduced right;Suspected CN VII (facial) dysfunction Lingual ROM: Other (Comment) (pt bit tongue on the left, ? deviation to left, pt cant prot) Lingual Symmetry: Abnormal symmetry left Lingual Strength: Reduced   Ice Chips Ice chips: Impaired Presentation: Spoon Pharyngeal Phase Impairments: Throat Clearing - Immediate   Thin Liquid Thin Liquid: Impaired Presentation: Cup;Straw Oral Phase Impairments: Poor awareness of bolus Pharyngeal  Phase Impairments: Throat Clearing - Immediate;Suspected delayed Swallow    Nectar Thick Nectar Thick Liquid: Not tested   Honey Thick Honey Thick Liquid: Not tested   Puree Puree: Within functional limits   Solid   GO   Solid: Impaired Presentation: Spoon Oral Phase Impairments: Poor awareness of bolus;Impaired mastication;Reduced labial seal Oral Phase Functional Implications: Oral holding;Oral residue;Prolonged oral transit       Herbie Baltimore, MA CCC-SLP [phone]  Anadelia Kintz, Katherene Ponto 01/07/2016,1:51 PM

## 2016-01-07 NOTE — Telephone Encounter (Signed)
Pt's wife called sts he had a seizure yesterday and was admitted to [facility] [phone]. She said this is the 1st seizure he's had. She is concerned the seizure may have caused damage to the progress he has made since the stroke.

## 2016-01-07 NOTE — Telephone Encounter (Signed)
Thanks. i spoke to wife and patient

## 2016-01-08 DIAGNOSIS — I69320 Aphasia following cerebral infarction: Secondary | ICD-10-CM | POA: Diagnosis not present

## 2016-01-08 DIAGNOSIS — E1121 Type 2 diabetes mellitus with diabetic nephropathy: Secondary | ICD-10-CM | POA: Diagnosis present

## 2016-01-08 DIAGNOSIS — G9340 Encephalopathy, unspecified: Secondary | ICD-10-CM | POA: Diagnosis present

## 2016-01-08 DIAGNOSIS — Z86718 Personal history of other venous thrombosis and embolism: Secondary | ICD-10-CM

## 2016-01-08 DIAGNOSIS — N186 End stage renal disease: Secondary | ICD-10-CM | POA: Diagnosis present

## 2016-01-08 DIAGNOSIS — I482 Chronic atrial fibrillation: Secondary | ICD-10-CM | POA: Diagnosis present

## 2016-01-08 DIAGNOSIS — E1122 Type 2 diabetes mellitus with diabetic chronic kidney disease: Secondary | ICD-10-CM | POA: Diagnosis present

## 2016-01-08 DIAGNOSIS — I5042 Chronic combined systolic (congestive) and diastolic (congestive) heart failure: Secondary | ICD-10-CM | POA: Diagnosis present

## 2016-01-08 DIAGNOSIS — R569 Unspecified convulsions: Secondary | ICD-10-CM

## 2016-01-08 DIAGNOSIS — D72829 Elevated white blood cell count, unspecified: Secondary | ICD-10-CM

## 2016-01-08 DIAGNOSIS — I110 Hypertensive heart disease with heart failure: Secondary | ICD-10-CM | POA: Diagnosis present

## 2016-01-08 DIAGNOSIS — R531 Weakness: Secondary | ICD-10-CM | POA: Diagnosis present

## 2016-01-08 DIAGNOSIS — N2581 Secondary hyperparathyroidism of renal origin: Secondary | ICD-10-CM | POA: Diagnosis present

## 2016-01-08 DIAGNOSIS — I4891 Unspecified atrial fibrillation: Secondary | ICD-10-CM | POA: Diagnosis present

## 2016-01-08 DIAGNOSIS — E876 Hypokalemia: Secondary | ICD-10-CM | POA: Diagnosis present

## 2016-01-08 DIAGNOSIS — I10 Essential (primary) hypertension: Secondary | ICD-10-CM | POA: Diagnosis present

## 2016-01-08 DIAGNOSIS — D638 Anemia in other chronic diseases classified elsewhere: Secondary | ICD-10-CM | POA: Diagnosis not present

## 2016-01-08 DIAGNOSIS — I69351 Hemiplegia and hemiparesis following cerebral infarction affecting right dominant side: Secondary | ICD-10-CM | POA: Diagnosis not present

## 2016-01-08 DIAGNOSIS — D5740 Sickle-cell thalassemia without crisis: Secondary | ICD-10-CM | POA: Diagnosis present

## 2016-01-08 DIAGNOSIS — I959 Hypotension, unspecified: Secondary | ICD-10-CM | POA: Diagnosis not present

## 2016-01-08 DIAGNOSIS — M6289 Other specified disorders of muscle: Secondary | ICD-10-CM | POA: Diagnosis not present

## 2016-01-08 DIAGNOSIS — S01512A Laceration without foreign body of oral cavity, initial encounter: Secondary | ICD-10-CM | POA: Diagnosis present

## 2016-01-08 DIAGNOSIS — G4089 Other seizures: Secondary | ICD-10-CM | POA: Diagnosis present

## 2016-01-08 DIAGNOSIS — G9341 Metabolic encephalopathy: Secondary | ICD-10-CM | POA: Diagnosis present

## 2016-01-08 DIAGNOSIS — Q211 Atrial septal defect: Secondary | ICD-10-CM | POA: Diagnosis not present

## 2016-01-08 DIAGNOSIS — Z8673 Personal history of transient ischemic attack (TIA), and cerebral infarction without residual deficits: Secondary | ICD-10-CM

## 2016-01-08 DIAGNOSIS — E11319 Type 2 diabetes mellitus with unspecified diabetic retinopathy without macular edema: Secondary | ICD-10-CM | POA: Diagnosis present

## 2016-01-08 HISTORY — DX: Elevated white blood cell count, unspecified: D72.829

## 2016-01-08 HISTORY — DX: Personal history of transient ischemic attack (TIA), and cerebral infarction without residual deficits: Z86.73

## 2016-01-08 HISTORY — DX: Personal history of other venous thrombosis and embolism: Z86.718

## 2016-01-08 HISTORY — DX: Weakness: R53.1

## 2016-01-08 LAB — PROTIME-INR
INR: 3.25 — ABNORMAL HIGH (ref 0.00–1.49)
Prothrombin Time: 32.5 seconds — ABNORMAL HIGH (ref 11.6–15.2)

## 2016-01-08 LAB — CBC
HCT: 25.6 % — ABNORMAL LOW (ref 39.0–52.0)
Hemoglobin: 9.1 g/dL — ABNORMAL LOW (ref 13.0–17.0)
MCH: 27.9 pg (ref 26.0–34.0)
MCHC: 35.5 g/dL (ref 30.0–36.0)
MCV: 78.5 fL (ref 78.0–100.0)
Platelets: 513 10*3/uL — ABNORMAL HIGH (ref 150–400)
RBC: 3.26 MIL/uL — ABNORMAL LOW (ref 4.22–5.81)
RDW: 16.6 % — ABNORMAL HIGH (ref 11.5–15.5)
WBC: 14.8 10*3/uL — ABNORMAL HIGH (ref 4.0–10.5)

## 2016-01-08 LAB — GLUCOSE, CAPILLARY
Glucose-Capillary: 103 mg/dL — ABNORMAL HIGH (ref 65–99)
Glucose-Capillary: 106 mg/dL — ABNORMAL HIGH (ref 65–99)
Glucose-Capillary: 104 mg/dL — ABNORMAL HIGH (ref 65–99)
Glucose-Capillary: 171 mg/dL — ABNORMAL HIGH (ref 65–99)
Glucose-Capillary: 133 mg/dL — ABNORMAL HIGH (ref 65–99)
Glucose-Capillary: 196 mg/dL — ABNORMAL HIGH (ref 65–99)

## 2016-01-08 LAB — HEPATITIS B SURFACE ANTIGEN: Hepatitis B Surface Ag: NEGATIVE

## 2016-01-08 MED ORDER — WARFARIN 0.5 MG HALF TABLET
0.5000 mg | ORAL_TABLET | Freq: Once | ORAL | Status: AC
  Administered 2016-01-08: 0.5 mg via ORAL
  Filled 2016-01-08: qty 1

## 2016-01-08 MED ORDER — METOPROLOL TARTRATE 12.5 MG HALF TABLET
12.5000 mg | ORAL_TABLET | Freq: Two times a day (BID) | ORAL | Status: DC
  Administered 2016-01-08 – 2016-01-10 (×5): 12.5 mg via ORAL
  Filled 2016-01-08 (×5): qty 1

## 2016-01-08 MED ORDER — VANCOMYCIN HCL 10 G IV SOLR
1500.0000 mg | Freq: Once | INTRAVENOUS | Status: AC
  Administered 2016-01-08: 1500 mg via INTRAVENOUS
  Filled 2016-01-08: qty 1500

## 2016-01-08 MED ORDER — INSULIN GLARGINE 100 UNIT/ML ~~LOC~~ SOLN
5.0000 [IU] | Freq: Every day | SUBCUTANEOUS | Status: DC
  Administered 2016-01-08 – 2016-01-10 (×3): 5 [IU] via SUBCUTANEOUS
  Filled 2016-01-08 (×4): qty 0.05

## 2016-01-08 NOTE — Progress Notes (Signed)
Wife reported to the bedside. RN explained plan of care. Wife verbalized understanding. Cpap placed on the patient. Ativan IV given was effective. Patient resting quietly in bed and is not yelling at this time. Will continue to monitor.

## 2016-01-08 NOTE — Progress Notes (Signed)
Patient returned to room from dialysis. RN assisted patient with eating dinner tray.Patient needs help with feeding.patient is a slow chewer and does pocket food into jaw occasionally but able to swallow. Spoke with the wife whom called for patient update. Reassured wife that patient would be taken care of well. Ativan 58m iv prn given for increased anxiety. Patient could be heard yelling into the hallway. Changed entire bed and linens and repositioned patient for comfort. Call bell within reach. Bed alarm on. Will monitor closely.

## 2016-01-08 NOTE — Progress Notes (Signed)
[place] for Warfarin Indication: pulmonary embolus - history  Allergies  Allergen Reactions  . Codeine Rash and Other (See Comments)    Unknown reaction (patient says it was more serious than just a rash, but he can't remember what happened)    Patient Measurements: Height: 5' 10"  (177.8 cm) Weight:  (UTO; bed will not weigh and pt cannot stand) IBW/kg (Calculated) : 73  Vital Signs: Temp: 99.2 F (37.3 C) (05/02 0529) Temp Source: Axillary (05/02 0529) BP: 141/97 mmHg (05/02 0529) Pulse Rate: 109 (05/02 0529)  Labs:  Recent Labs  01/06/16 1144 01/06/16 1153 01/07/16 0500 01/07/16 1658 01/08/16 0517  HGB 9.2* 11.2* 9.2*  --  9.1*  HCT 25.3* 33.0* 25.1*  --  25.6*  PLT 420*  --  539*  --  513*  APTT 41*  --   --   --   --   LABPROT 26.9*  --  28.7*  --  32.5*  INR 2.53*  --  2.75*  --  3.25*  CREATININE 7.31* 7.30*  --  7.34*  --    Estimated Creatinine Clearance: 13.3 mL/min (by C-G formula based on Cr of 7.34).   Assessment: 50yo male with multiple medical problems including history of bilateral PE.  He is on chronic Warfarin therapy for this.  He is admitted today for possible CVA but did not qualify for TPA.  We have been asked to resume his Warfarin therapy.   He is without noted bleeding complications.  Home Dose:  Warfarin 7.54m daily  INR elevated today 3.25  Goal of Therapy:  INR 2-3 Monitor platelets by anticoagulation protocol: Yes   Plan:  -Coumadin 0.5 mg po x 1 - PT/INR daily  Thank you LAnette Guarneri PharmD [phone] 01/08/2016,8:47 AM

## 2016-01-08 NOTE — Progress Notes (Signed)
Speech Language Pathology Treatment: Dysphagia;Cognitive-Linquistic  Patient Details Name: Isaiah [name] [facility] MRN: [id number] DOB: Jul 04, 1966 Today's Date: 01/08/2016 Time: 5885-0277 SLP Time Calculation (min) (ACUTE ONLY): 28 min  Assessment / Plan / Recommendation Clinical Impression  Pt demonstrates improvement in arousal, attention and intelligibility though he is still far from baseline. Pt continues to perseverate on ideas, but is now communicating in full sentences with intent to communicate ideas. He keeps telling his wife about a coffin. Provided cues for pt to extend phrases with new descriptions to facilitate listener comprehension. Apparently he felt he was in a coffin when he was in the MRI and now is perseverting on death. SLP was able to redirect by acknowledging his experience and asking him to move on, which was successful for several minutes at a time. His speech is mildly dysarthric due to residual right sided lingual and facial weakness, but moderately apraxic, improving from yesterday. SLP offered models, phonemic and articulatory placement cues for improved accuracy during conversation and with single syllable words during reading task with 70% accuracy.  There are no further signs of aspiration with thin liquids over 16 oz of intake, but pt still has mild left sided oral residuals with solids due to having bitten his tongue there during a seizure. Pt cleared with verbal cues. Recommend Pt upgrade to dys 2/thin. Suggest CIR at d/c.    HPI HPI: 50 year old male with end-stage renal disease, hypertension, diabetes, recently had a stroke with residual right-sided weakness, admitted with seizure-like activity witnessed by his wife. Patient has been having a stroke at the beginning of this year, has an inpatient followed by outpatient rehabilitation and has been doing well recently. Pt had a decline in am on 5/1 and MRI si being repeated. Pt has a history of dysarthria from prior CVA and was  being seen at OP SLP, but no history of dysphagia.       SLP Plan  Continue with current plan of care     Recommendations  Diet recommendations: Dysphagia 2 (fine chop);Thin liquid Liquids provided via: Cup;Straw Medication Administration: Whole meds with puree Supervision: Full supervision/cueing for compensatory strategies Compensations: Slow rate;Small sips/bites;Lingual sweep for clearance of pocketing;Monitor for anterior loss;Follow solids with liquid Postural Changes and/or Swallow Maneuvers: Seated upright 90 degrees             General recommendations: Rehab consult Oral Care Recommendations: Oral care BID Plan: Continue with current plan of care     Isaiah [name], MA CCC-SLP [phone]  Isaiah [name] 01/08/2016, 12:27 PM

## 2016-01-08 NOTE — Progress Notes (Signed)
Pharmacy Antibiotic Note  Isaiah [name] [facility] is a 50 y.o. male admitted on 01/06/2016 with cath site infection.  Pharmacy has been consulted for Vancomycin dosing.  Plan: Vancomycin 1500 mg iv x 1 today Follow up HD schedule  Height: 5' 10"  (177.8 cm) Weight:  (UTO; bed will not weigh and pt cannot stand) IBW/kg (Calculated) : 73  Temp (24hrs), Avg:98.7 F (37.1 C), Min:98 F (36.7 C), Max:99.5 F (37.5 C)   Recent Labs Lab 01/01/16 1027 01/06/16 1144 01/06/16 1153 01/07/16 0500 01/07/16 1658 01/08/16 0517  WBC 20.2* 20.4*  --  18.3*  --  14.8*  CREATININE 4.7* 7.31* 7.30*  --  7.34*  --     Estimated Creatinine Clearance: 13.3 mL/min (by C-G formula based on Cr of 7.34).    Allergies  Allergen Reactions  . Codeine Rash and Other (See Comments)    Unknown reaction (patient says it was more serious than just a rash, but he can't remember what happened)    Antimicrobials this admission: Vancomycin 5/2>  Dose adjustments this admission: n/a  Microbiology results: Pending  Thank you for allowing pharmacy to be a part of this patient's care. Anette Guarneri, PharmD [phone] 01/08/2016 8:46 AM

## 2016-01-08 NOTE — Progress Notes (Signed)
  [facility] Progress Note   Subjective: more alert and interactive today  Filed Vitals:   01/08/16 1400 01/08/16 1430 01/08/16 1454 01/08/16 1500  BP: 140/108 148/93 158/103 159/98  Pulse: 114 120 120 116  Temp:    98.5 F (36.9 C)  TempSrc:      Resp: 12 18 22 13   Height:      Weight:    94.4 kg (208 lb 1.8 oz)  SpO2:        Inpatient medications: . aspirin EC  81 mg Oral Daily  . cinacalcet  60 mg Oral Q breakfast  . fluticasone furoate-vilanterol  1 puff Inhalation Daily  . heparin  3,000 Units Dialysis Once in dialysis  . insulin aspart  0-5 Units Subcutaneous QHS  . insulin aspart  0-9 Units Subcutaneous TID WC  . insulin glargine  5 Units Subcutaneous QHS  . lacosamide  100 mg Oral BID  . midodrine  2.5 mg Oral BID WC  . pantoprazole  40 mg Oral BID  . sodium chloride flush  3 mL Intravenous Q12H  . warfarin  0.5 mg Oral ONCE-1800  . warfarin  7.5 mg Oral ONCE-1800  . Warfarin - Pharmacist Dosing Inpatient   Does not apply q1800     sodium chloride, sodium chloride, sodium chloride, sodium chloride, acetaminophen **OR** acetaminophen, alteplase, heparin, heparin, lidocaine (PF), lidocaine-prilocaine, LORazepam, ondansetron **OR** ondansetron (ZOFRAN) IV, pentafluoroprop-tetrafluoroeth, RESOURCE THICKENUP CLEAR  Exam: Pt alert, mostly oriented, R facial droop No rash, cyanosis or gangrene Sclera anicteric, throat clear  No jvd or bruits Chest occ rhonchi, no rales or wheezing RRR no MRG Abd soft ntnd no mass or ascites +bs obese GU normal male foley in place MS no joint effusions or deformity Ext no LE edema / no wounds or ulcers Neuro grips 5/5 on L and 2-3/5 on R   Dialysis: [facility] TTS 4h 52mn 89kg 2/2.25 bath RFA AVF maturing (3/28)/ R IJ cath  Venofer 50/ wk Mircera 100 q 2, last 4/19 Calc 0.5 ug   Assessment: 1 Seizures, new onset w depressed MS - poss drug tox (baclofen) vs infectious ([facility] hd cath). F/U blood cx's. Looks  better today.  2 ESRD HD TTS 3 Hx DVT/ PE 4 Hx CVA's, prob related to PFO 5 Hx afib 6 Hx GIB 7 Obesity  Plan - HD today   RKelly SplinterMD [facility] pager [phone]   cell [phone]/10/2015, 3:38 PM    Recent Labs Lab 01/06/16 1144 01/06/16 1153 01/07/16 1658  NA 138 139 136  K 2.9* 2.9* 3.3*  CL 102 101 101  CO2 15*  --  22  GLUCOSE 192* 181* 109*  BUN 35* 36* 41*  CREATININE 7.31* 7.30* 7.34*  CALCIUM 7.4*  --  7.0*  PHOS  --   --  5.1*    Recent Labs Lab 01/06/16 1144 01/07/16 1658  AST 30  --   ALT 14*  --   ALKPHOS 113  --   BILITOT 1.2  --   PROT 8.0  --   ALBUMIN 3.3* 3.3*    Recent Labs Lab 01/06/16 1144 01/06/16 1153 01/07/16 0500 01/08/16 0517  WBC 20.4*  --  18.3* 14.8*  NEUTROABS 13.3*  --   --   --   HGB 9.2* 11.2* 9.2* 9.1*  HCT 25.3* 33.0* 25.1* 25.6*  MCV 80.1  --  79.4 78.5  PLT 420*  --  539* 513*

## 2016-01-08 NOTE — Progress Notes (Signed)
Patient ID: Isaiah [name], male   DOB: [birth year], 50 y.o.   MRN: [id number]  PROGRESS NOTE    [place]  AJG:[id number] DOB: 1965/11/12 DOA: 01/06/2016  PCP: Foye Spurling, MD  Outpatient Specialists: Neurology, Dr. Antony Contras   Brief Narrative:  50 year old male with past medical history significant for end-stage renal disease on hemodialysis MWF, diabetes, hypertension, atrial fibrillation on anticoagulation with Coumadin, recent stroke with residual right-sided weakness. He presented to [name] with seizure-like activity and worsening mental status per patient's wife at the bedside.  On admission, patient was hemodynamically stable. Blood work was notable for leukocytosis of 20.4, hemoglobin 9.2, potassium 2.9, creatinine 7.31, INR 2.53. CT head showed no acute intracranial findings. MRI findings showed diffusion only MRI without definitive acute infarct. Patient does have multiple late subacute to chronic infarcts bilaterally. Posterior left frontal diffusion abnormality could potentially reflect sequela of recent seizure activity however superimposed acute ischemia cannot be excluded. Patient has proximal left M2 branch stenosis with progressive attenuation of more distal left MCA branch vessels, new moderate to severe left P1 PCA stenosis. Neurology has seen the patient in consultation.   Assessment & Plan:   Principal Problem:   Acute toxic encephalopathy / seizures - Unclear etiology of new mental status changes. - MRI showed possible seizure activity versus superimposed acute ischemia - EEG 5/1 - severely abnormal EEG due to the presence of Severe background slowing indicative of bihemispheric dysfunction which is a nonspecific finding seen in a variety of toxic, metabolic or hypoxic etiologies. No definite ongoing seizure activity is noted - Of note, patient was started on 2 new medications recently, Sensipar and Baclofen.Stopped baclofen as pt wife concerned  that this medication cause his mental status to deteriorate  - He is on lacosamide 100 mg twice daily - Appreciate neurology following  - Dysphagia 1 diet per SLP, will need to reassess as mental status improving   Active Problems:   End-stage renal disease on hemodialysis - Appreciate renal following - Hemodialysis MWF    DM (diabetes mellitus) type II controlled with renal manifestation on current long-term insulin use (HCC) - Last A1c 12/03/2015 was 6.4, indicating good glycemic control - Per DM coordinator, reduce Lantus to 5 Units (currently 13 U) - Continue SSI - CBG's in past 24 hours: 104, 106, 104    HTN (hypertension) / hypotension - Continue Midodrin 2.5 mg twice daily      Hypokalemia - Due to end-stage renal disease - Given potassium 20 meq 5/1 - BMP this am is still pending     Leukocytosis - Unclear etiology. Chest x-ray showed no acute cardiopulmonary findings - Patient was not started on antibiotics at the time of the admission - White blood cell count is improving, 20.4 --> 14.8    Anemia of chronic renal disease - Hemoglobin stable at 9.2, 9.1   DVT prophylaxis: On anticoagulation with Coumadin Code Status: full code  Family Communication: Patient's wife at the bedside Disposition Plan: Unable to determine level patient be ready for discharge.   Consultants:   Neurology  Nephrology  SLP - 5/1 dysphagia 1 diet   DM coordinator   Procedures:   EEG 01/07/2016 - severely abnormal EEG due to the presence of Severe background slowing indicative of bihemispheric dysfunction which is a nonspecific finding seen in a variety of toxic, metabolic or hypoxic etiologies. No definite ongoing seizure activity is noted  Antimicrobials:   None    Subjective: Patient still disoriented . Yesterday  intermittently agitated in HD.  Objective: Filed Vitals:   01/07/16 1908 01/07/16 2208 01/08/16 0246 01/08/16 0529  BP: 148/88 123/76 152/104 141/97  Pulse:  122 113 117 109  Temp: 98.8 F (37.1 C) 98.9 F (37.2 C) 99.5 F (37.5 C) 99.2 F (37.3 C)  TempSrc: Oral Oral Axillary Axillary  Resp: 20 20  18   Height:      Weight:      SpO2:  100% 100% 100%    Intake/Output Summary (Last 24 hours) at 01/08/16 3338 Last data filed at 01/07/16 1908  Gross per 24 hour  Intake      0 ml  Output    400 ml  Net   -400 ml   Filed Weights   01/06/16 1155 01/06/16 1414  Weight: 92.987 kg (205 lb) 90.719 kg (200 lb)    Examination:  General exam: Appears in no distress Respiratory system: Respiratory effort normal. No wheezing. Cardiovascular system: S1 & S2 appreciated, Rate controlled. Gastrointestinal system: (+) BS, non tender, non distended abdomen  Central nervous system: Right upper extremity weakness otherwise nonfocal deficit Extremities: Patient able to lift lower extremities. Palpable pulses  Skin: No rashes, no ulcers Psychiatry: Disoriented, no agitation   Data Reviewed: I have personally reviewed following labs and imaging studies  CBC:  Recent Labs Lab 01/01/16 1027 01/06/16 1144 01/06/16 1153 01/07/16 0500 01/08/16 0517  WBC 20.2* 20.4*  --  18.3* 14.8*  NEUTROABS 15.6* 13.3*  --   --   --   HGB 9.2* 9.2* 11.2* 9.2* 9.1*  HCT 25.4* 25.3* 33.0* 25.1* 25.6*  MCV 79.6 80.1  --  79.4 78.5  PLT 588* 420*  --  539* 329*   Basic Metabolic Panel:  Recent Labs Lab 01/01/16 1027 01/06/16 1144 01/06/16 1153 01/07/16 1658  NA 134* 138 139 136  K 3.0* 2.9* 2.9* 3.3*  CL  --  102 101 101  CO2 25 15*  --  22  GLUCOSE 81 192* 181* 109*  BUN 18.5 35* 36* 41*  CREATININE 4.7* 7.31* 7.30* 7.34*  CALCIUM 8.9 7.4*  --  7.0*  PHOS  --   --   --  5.1*   GFR: Estimated Creatinine Clearance: 13.3 mL/min (by C-G formula based on Cr of 7.34). Liver Function Tests:  Recent Labs Lab 01/01/16 1027 01/06/16 1144 01/07/16 1658  AST 15 30  --   ALT 9 14*  --   ALKPHOS 104 113  --   BILITOT 1.71* 1.2  --   PROT 8.7*  8.0  8.0  --   ALBUMIN 3.6 3.3* 3.3*   No results for input(s): LIPASE, AMYLASE in the last 168 hours. No results for input(s): AMMONIA in the last 168 hours. Coagulation Profile:  Recent Labs Lab 01/06/16 1144 01/07/16 0500 01/08/16 0517  INR 2.53* 2.75* 3.25*   Cardiac Enzymes: No results for input(s): CKTOTAL, CKMB, CKMBINDEX, TROPONINI in the last 168 hours. BNP (last 3 results) No results for input(s): PROBNP in the last 8760 hours. HbA1C:  Recent Labs  01/06/16 1453  HGBA1C 4.4*   CBG:  Recent Labs Lab 01/07/16 0642 01/07/16 0743 01/07/16 1130 01/07/16 2218 01/08/16 0621  GLUCAP 42* 113* 104* 106* 104*   Lipid Profile: No results for input(s): CHOL, HDL, LDLCALC, TRIG, CHOLHDL, LDLDIRECT in the last 72 hours. Thyroid Function Tests:  Recent Labs  01/06/16 1803  TSH 0.896   Anemia Panel: No results for input(s): VITAMINB12, FOLATE, FERRITIN, TIBC, IRON, RETICCTPCT in the last 72  hours. Urine analysis:    Component Value Date/Time   COLORURINE YELLOW 12/03/2015 [place] 12/03/2015 1746   LABSPEC 1.009 12/03/2015 1746   PHURINE 5.5 12/03/2015 1746   GLUCOSEU 100* 12/03/2015 1746   HGBUR NEGATIVE 12/03/2015 1746   BILIRUBINUR NEGATIVE 12/03/2015 1746   KETONESUR NEGATIVE 12/03/2015 1746   PROTEINUR 100* 12/03/2015 1746   UROBILINOGEN 0.2 10/01/2014 1124   NITRITE NEGATIVE 12/03/2015 1746   LEUKOCYTESUR NEGATIVE 12/03/2015 1746   Sepsis Labs: @LABRCNTIP (procalcitonin:4,lacticidven:4)  Recent Results (from the past 240 hour(s))  TECHNOLOGIST REVIEW     Status: None   Collection Time: 01/01/16 10:27 AM  Result Value Ref Range Status   Technologist Review few teardrops, ovalos, targets. few large plts.  Final  Culture, blood (routine x 2)     Status: None (Preliminary result)   Collection Time: 01/07/16  8:18 PM  Result Value Ref Range Status   Specimen Description BLOOD LEFT FOREARM  Final   Special Requests BOTTLES DRAWN AEROBIC  AND ANAEROBIC 10CC  Final   Culture PENDING  Incomplete   Report Status PENDING  Incomplete  Culture, blood (routine x 2)     Status: None (Preliminary result)   Collection Time: 01/07/16  8:24 PM  Result Value Ref Range Status   Specimen Description BLOOD LEFT HAND  Final   Special Requests BOTTLES DRAWN AEROBIC AND ANAEROBIC 10CC  Final   Culture PENDING  Incomplete   Report Status PENDING  Incomplete      Radiology Studies: Ct Head Wo Contrast 01/06/2016  1. No acute intracranial abnormalities. 2. Age advanced brain atrophy. 3. Chronic areas of encephalomalacia noted bilaterally. Electronically Signed   By: Kerby Moors M.D.   On: 01/06/2016 12:03   Mr Jodene Nam Head Wo Contrast 01/06/2016 1. Diffusion only MRI without definite acute infarct identified. Multiple late subacute to chronic infarcts bilaterally as above with associated chronic blood products accounting for diffusion abnormality. Posterior left frontal diffusion abnormality could also potentially reflect the sequelae of recent seizure activity, though a trace amount of superimposed acute ischemia cannot be excluded. 2. Motion degraded MRA without emergent large vessel occlusion. 3. Proximal left M2 branch stenosis (with apparent improved patency compared to prior outside MRA) with progressive attenuation of more distal left MCA branch vessels. 4. New moderate to severe left P1 PCA stenoses, with assessment limited by motion artifact. Study was reviewed in person with Dr. Leonel Ramsay on 01/06/2016 at 12:30 p.m. Electronically Signed   By: Logan Bores M.D.   On: 01/06/2016 13:53   Dg Chest Port 1 View 01/06/2016  No active disease. Electronically Signed   By: Skipper Cliche M.D.   On: 01/06/2016 15:03   Mr Brain Ltd W/o Cm 01/06/2016   1. Diffusion only MRI without definite acute infarct identified. Multiple late subacute to chronic infarcts bilaterally as above with associated chronic blood products accounting for diffusion  abnormality. Posterior left frontal diffusion abnormality could also potentially reflect the sequelae of recent seizure activity, though a trace amount of superimposed acute ischemia cannot be excluded. 2. Motion degraded MRA without emergent large vessel occlusion. 3. Proximal left M2 branch stenosis (with apparent improved patency compared to prior outside MRA) with progressive attenuation of more distal left MCA branch vessels. 4. New moderate to severe left P1 PCA stenoses, with assessment limited by motion artifact. Study was reviewed in person with Dr. Leonel Ramsay on 01/06/2016 at 12:30 p.m. Electronically Signed   By: Logan Bores M.D.   On: 01/06/2016  13:53    Scheduled Meds: . aspirin EC  81 mg Oral Daily  . cinacalcet  60 mg Oral Q breakfast  . fluticasone furoate-vilanterol  1 puff Inhalation Daily  . heparin  3,000 Units Dialysis Once in dialysis  . insulin aspart  0-5 Units Subcutaneous QHS  . insulin aspart  0-9 Units Subcutaneous TID WC  . insulin glargine  5 Units Subcutaneous QHS  . lacosamide  100 mg Oral BID  . midodrine  2.5 mg Oral BID WC  . pantoprazole  40 mg Oral BID  . sodium chloride flush  3 mL Intravenous Q12H  . warfarin  7.5 mg Oral ONCE-1800  . Warfarin - Pharmacist Dosing Inpatient   Does not apply q1800   Continuous Infusions:      Time spent: 25 minutes    Leisa Lenz, MD [facility] Pager [phone]  If 7PM-7AM, please contact night-coverage [email or web address] Password [facility] 01/08/2016, 6:52 AM

## 2016-01-08 NOTE — Progress Notes (Addendum)
Patient ID: [place], male   DOB: 1966/01/24, 50 y.o.   MRN: [id number]  PROGRESS NOTE    [address]  VQM:[id number] DOB: Mar 09, 1966 DOA: 01/06/2016  PCP: Foye Spurling, MD  Outpatient Specialists: Neurology, Dr. Antony Contras   Brief Narrative:  50 year old male with past medical history significant for end-stage renal disease on hemodialysis MWF, diabetes, hypertension, atrial fibrillation on anticoagulation with Coumadin, recent stroke with residual right-sided weakness. He presented to [facility] with seizure-like activity and worsening mental status per patient's wife at the bedside.  On admission, patient was hemodynamically stable. Blood work was notable for leukocytosis of 20.4, hemoglobin 9.2, potassium 2.9, creatinine 7.31, INR 2.53.   CT head showed no acute intracranial findings. MRI 4/30 with findings of diffusion only MRI without definitive acute infarct. Patient does have multiple late subacute to chronic infarcts bilaterally. Posterior left frontal diffusion abnormality could potentially reflect sequela of recent seizure activity however superimposed acute ischemia cannot be excluded. MRI repeated 5/1 and it showed findings compatible with the expected evolution of subacute/early chronic left MCA territory infarcts. Pt does have few areas in brain that show remote hemorrhagic infarcts.  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 Per patient's wife at the bedside, she says this is new. Patient is disoriented, keeps repeating words randomly - EEG 01/07/2016 - severely abnormal EEG due to the presence of Severe background slowing indicative of bihemispheric dysfunction which is a nonspecific finding seen in a variety of toxic, metabolic or hypoxic etiologies. No definite ongoing seizure activity is noted -  As noted above, MRI done 2 times, does not seem to be new stroke but he does have chronic left MCA infarcts along with few ares of remote hemorrhagic infarcts Could he possibly have progression of stroke, vascular encephalpathy - Appreciate neurology following and their input - I consulted psychiatry as well for suggestions on altered mental status - He is on lacosamide 100 mg twice daily - Continue daily aspirin  - Continue to monitor mental status  Active Problems:   End-stage renal disease on hemodialysis - Appreciate renal following - Hemodialysis MWF    DM (diabetes mellitus) type II controlled with renal manifestation on current long-term insulin use (HCC) - Last A1c 12/03/2015 was 6.4, indicating good glycemic control - Continue Lantus 5 units at bedtime along with sliding scale insulin - Seen by DM coordinator  - CBG's in past 24 hours: 106, 104, 171    Essential hypertension / hypotension - Continue Midodrin 2.5 mg twice daily - Started low dose metoprolol 12.5 Q 12 hours since BP 159/98 and HR 116    Chronic diastolic and systolic CHF - Last 2 D ECHO in 10/2015 showed EF 45% with grade 1 diastolic dysfunction - Compensated  - Started low dose metoprolol 12.5 mg Q 12 hours     History of PE and DVT - On AC with coumadin, dosing per pharmacy       Hypokalemia - Due to end-stage renal disease - HD MWF - Supplemented - Follow up BMP in am     Chronic atrial fibrillation - CHADS vasc score 5 (htn, chf, dm, stroke) - On AC with coumadin - INR 3.25, dosing per pharmacy  - He is not on any BB so started low dose metoprolol 12.5 mg Q 12 hours (HR 116)  Leukocytosis - Unclear etiology. Chest x-ray showed no acute cardiopulmonary findings - Patient was not started on antibiotics at the time of the admission - White blood cell count is improving, we'll continue to monitor    Anemia of chronic renal disease - Hemoglobin stable at 9.2, 9.1 - No reports of bleeding     DVT prophylaxis: On anticoagulation with Coumadin Code Status: full code  Family Communication: Patient's wife at the bedside Disposition Plan: Unable to determine level patient be ready for discharge, at present he is confused, disoriented. Anticipate he will stay in hospital at least for next 2 days for evaluation.   Consultants:   Neurology  Psychiatry   Procedures:   EEG 5/1 - This is a severely abnormal EEG due to the presence of Severe background slowing indicative of bihemispheric dysfunction which is a nonspecific finding seen in a variety of toxic, metabolic or hypoxic etiologies. No definite ongoing seizure activity is noted  Antimicrobials:   None    Subjective: Patient remains disoriented.   Objective: Filed Vitals:   01/08/16 1400 01/08/16 1430 01/08/16 1454 01/08/16 1500  BP: 140/108 148/93 158/103 159/98  Pulse: 114 120 120 116  Temp:    98.5 F (36.9 C)  TempSrc:      Resp: 12 18 22 13   Height:      Weight:    94.4 kg (208 lb 1.8 oz)  SpO2:        Intake/Output Summary (Last 24 hours) at 01/08/16 1631 Last data filed at 01/07/16 1908  Gross per 24 hour  Intake      0 ml  Output    400 ml  Net   -400 ml   Filed Weights   01/06/16 1414 01/08/16 1220 01/08/16 1500  Weight: 90.719 kg (200 lb) 94.1 kg (207 lb 7.3 oz) 94.4 kg (208 lb 1.8 oz)    Examination:  General exam: Appears calm and comfortable, no distress  Respiratory system: Respiratory effort normal. Bilateral air entry, no wheezing  Cardiovascular system: S1 & S2 appreciated, Rate controlled. Gastrointestinal system: (+) BS, non tender abdomen, not distended  Central nervous system: Right upper extremity weakness otherwise nonfocal deficit Extremities: Patient able to lift lower extremities. Right upper extremity weakness noted. Able to lift left upper extremity Skin: No rashes or ulcers Psychiatry: Disoriented, not agitated  Data Reviewed: I have personally reviewed  following labs and imaging studies  CBC:  Recent Labs Lab 01/06/16 1144 01/06/16 1153 01/07/16 0500 01/08/16 0517  WBC 20.4*  --  18.3* 14.8*  NEUTROABS 13.3*  --   --   --   HGB 9.2* 11.2* 9.2* 9.1*  HCT 25.3* 33.0* 25.1* 25.6*  MCV 80.1  --  79.4 78.5  PLT 420*  --  539* 583*   Basic Metabolic Panel:  Recent Labs Lab 01/06/16 1144 01/06/16 1153 01/07/16 1658  NA 138 139 136  K 2.9* 2.9* 3.3*  CL 102 101 101  CO2 15*  --  22  GLUCOSE 192* 181* 109*  BUN 35* 36* 41*  CREATININE 7.31* 7.30* 7.34*  CALCIUM 7.4*  --  7.0*  PHOS  --   --  5.1*   GFR: Estimated Creatinine Clearance: 13.6 mL/min (by C-G formula based on Cr of 7.34). Liver Function Tests:  Recent Labs Lab 01/06/16 1144 01/07/16 1658  AST 30  --   ALT 14*  --   ALKPHOS 113  --   BILITOT 1.2  --   PROT 8.0  --  ALBUMIN 3.3* 3.3*   No results for input(s): LIPASE, AMYLASE in the last 168 hours. No results for input(s): AMMONIA in the last 168 hours. Coagulation Profile:  Recent Labs Lab 01/06/16 1144 01/07/16 0500 01/08/16 0517  INR 2.53* 2.75* 3.25*   Cardiac Enzymes: No results for input(s): CKTOTAL, CKMB, CKMBINDEX, TROPONINI in the last 168 hours. BNP (last 3 results) No results for input(s): PROBNP in the last 8760 hours. HbA1C:  Recent Labs  01/06/16 1453  HGBA1C 4.4*   CBG:  Recent Labs Lab 01/07/16 1130 01/07/16 1846 01/07/16 2218 01/08/16 0621 01/08/16 1140  GLUCAP 104* 103* 106* 104* 171*   Lipid Profile: No results for input(s): CHOL, HDL, LDLCALC, TRIG, CHOLHDL, LDLDIRECT in the last 72 hours. Thyroid Function Tests:  Recent Labs  01/06/16 1803  TSH 0.896   Anemia Panel: No results for input(s): VITAMINB12, FOLATE, FERRITIN, TIBC, IRON, RETICCTPCT in the last 72 hours. Urine analysis:    Component Value Date/Time   COLORURINE YELLOW 12/03/2015 [place] 12/03/2015 1746   LABSPEC 1.009 12/03/2015 1746   PHURINE 5.5 12/03/2015 1746    GLUCOSEU 100* 12/03/2015 1746   HGBUR NEGATIVE 12/03/2015 1746   BILIRUBINUR NEGATIVE 12/03/2015 1746   KETONESUR NEGATIVE 12/03/2015 1746   PROTEINUR 100* 12/03/2015 1746   UROBILINOGEN 0.2 10/01/2014 1124   NITRITE NEGATIVE 12/03/2015 1746   LEUKOCYTESUR NEGATIVE 12/03/2015 1746   Sepsis Labs: @LABRCNTIP (procalcitonin:4,lacticidven:4)  Recent Results (from the past 240 hour(s))  TECHNOLOGIST REVIEW     Status: None   Collection Time: 01/01/16 10:27 AM  Result Value Ref Range Status   Technologist Review few teardrops, ovalos, targets. few large plts.  Final  Culture, blood (routine x 2)     Status: None (Preliminary result)   Collection Time: 01/07/16  8:18 PM  Result Value Ref Range Status   Specimen Description BLOOD LEFT FOREARM  Final   Special Requests BOTTLES DRAWN AEROBIC AND ANAEROBIC 10CC  Final   Culture NO GROWTH < 24 HOURS  Final   Report Status PENDING  Incomplete  Culture, blood (routine x 2)     Status: None (Preliminary result)   Collection Time: 01/07/16  8:24 PM  Result Value Ref Range Status   Specimen Description BLOOD LEFT HAND  Final   Special Requests BOTTLES DRAWN AEROBIC AND ANAEROBIC 10CC  Final   Culture NO GROWTH < 24 HOURS  Final   Report Status PENDING  Incomplete      Radiology Studies: Ct Head Wo Contrast 01/06/2016  1. No acute intracranial abnormalities. 2. Age advanced brain atrophy. 3. Chronic areas of encephalomalacia noted bilaterally. Electronically Signed   By: Kerby Moors M.D.   On: 01/06/2016 12:03   Mr Jodene Nam Head Wo Contrast 01/06/2016 1. Diffusion only MRI without definite acute infarct identified. Multiple late subacute to chronic infarcts bilaterally as above with associated chronic blood products accounting for diffusion abnormality. Posterior left frontal diffusion abnormality could also potentially reflect the sequelae of recent seizure activity, though a trace amount of superimposed acute ischemia cannot be excluded. 2. Motion  degraded MRA without emergent large vessel occlusion. 3. Proximal left M2 branch stenosis (with apparent improved patency compared to prior outside MRA) with progressive attenuation of more distal left MCA branch vessels. 4. New moderate to severe left P1 PCA stenoses, with assessment limited by motion artifact. Study was reviewed in person with Dr. Leonel Ramsay on 01/06/2016 at 12:30 p.m. Electronically Signed   By: Logan Bores M.D.   On: 01/06/2016  13:53   Dg Chest Port 1 View 01/06/2016  No active disease. Electronically Signed   By: Skipper Cliche M.D.   On: 01/06/2016 15:03   Mr Brain Ltd W/o Cm 01/06/2016   1. Diffusion only MRI without definite acute infarct identified. Multiple late subacute to chronic infarcts bilaterally as above with associated chronic blood products accounting for diffusion abnormality. Posterior left frontal diffusion abnormality could also potentially reflect the sequelae of recent seizure activity, though a trace amount of superimposed acute ischemia cannot be excluded. 2. Motion degraded MRA without emergent large vessel occlusion. 3. Proximal left M2 branch stenosis (with apparent improved patency compared to prior outside MRA) with progressive attenuation of more distal left MCA branch vessels. 4. New moderate to severe left P1 PCA stenoses, with assessment limited by motion artifact. Study was reviewed in person with Dr. Leonel Ramsay on 01/06/2016 at 12:30 p.m. Electronically Signed   By: Logan Bores M.D.   On: 01/06/2016 13:53    Scheduled Meds: . aspirin EC  81 mg Oral Daily  . cinacalcet  60 mg Oral Q breakfast  . fluticasone furoate-vilanterol  1 puff Inhalation Daily  . insulin aspart  0-5 Units Subcutaneous QHS  . insulin aspart  0-9 Units Subcutaneous TID WC  . insulin glargine  5 Units Subcutaneous QHS  . lacosamide  100 mg Oral BID  . midodrine  2.5 mg Oral BID WC  . pantoprazole  40 mg Oral BID  . sodium chloride flush  3 mL Intravenous Q12H  .  warfarin  0.5 mg Oral ONCE-1800  . warfarin  7.5 mg Oral ONCE-1800  . Warfarin - Pharmacist Dosing Inpatient   Does not apply q1800   Continuous Infusions:      Time spent: 25 minutes    Leisa Lenz, MD [facility] Pager [phone]  If 7PM-7AM, please contact night-coverage [email or web address] Password [email or web address] 01/08/2016, 4:31 PM

## 2016-01-09 LAB — CBC
HCT: 29.6 % — ABNORMAL LOW (ref 39.0–52.0)
Hemoglobin: 10.4 g/dL — ABNORMAL LOW (ref 13.0–17.0)
MCH: 28.3 pg (ref 26.0–34.0)
MCHC: 35.1 g/dL (ref 30.0–36.0)
MCV: 80.7 fL (ref 78.0–100.0)
Platelets: 487 10*3/uL — ABNORMAL HIGH (ref 150–400)
RBC: 3.67 MIL/uL — ABNORMAL LOW (ref 4.22–5.81)
RDW: 16.6 % — ABNORMAL HIGH (ref 11.5–15.5)
WBC: 16.1 10*3/uL — ABNORMAL HIGH (ref 4.0–10.5)

## 2016-01-09 LAB — PROTIME-INR
INR: 2.47 — ABNORMAL HIGH (ref 0.00–1.49)
Prothrombin Time: 26.4 seconds — ABNORMAL HIGH (ref 11.6–15.2)

## 2016-01-09 LAB — GLUCOSE, CAPILLARY
Glucose-Capillary: 120 mg/dL — ABNORMAL HIGH (ref 65–99)
Glucose-Capillary: 200 mg/dL — ABNORMAL HIGH (ref 65–99)
Glucose-Capillary: 135 mg/dL — ABNORMAL HIGH (ref 65–99)
Glucose-Capillary: 175 mg/dL — ABNORMAL HIGH (ref 65–99)

## 2016-01-09 MED ORDER — WARFARIN SODIUM 7.5 MG PO TABS
7.5000 mg | ORAL_TABLET | Freq: Once | ORAL | Status: AC
  Administered 2016-01-09: 7.5 mg via ORAL
  Filled 2016-01-09: qty 1

## 2016-01-09 MED ORDER — SODIUM CHLORIDE 0.9 % IV SOLN
2.0000 g | Freq: Once | INTRAVENOUS | Status: AC
  Administered 2016-01-09: 2 g via INTRAVENOUS
  Filled 2016-01-09: qty 20

## 2016-01-09 MED ORDER — VANCOMYCIN HCL IN DEXTROSE 1-5 GM/200ML-% IV SOLN
1000.0000 mg | INTRAVENOUS | Status: DC
  Administered 2016-01-10: 1000 mg via INTRAVENOUS
  Filled 2016-01-09 (×2): qty 200

## 2016-01-09 NOTE — Progress Notes (Signed)
Patient ID: [place], male   DOB: 03-02-1966, 50 y.o.   MRN: [id number]  PROGRESS NOTE    [place]  [id number]:[id number] DOB: 1965-10-08 DOA: 01/06/2016  PCP: Foye Spurling, MD  Outpatient Specialists: Neurology, Dr. Antony Contras   Brief Narrative:  50 year old male with past medical history significant for end-stage renal disease on hemodialysis MWF, diabetes, hypertension, atrial fibrillation on anticoagulation with Coumadin, recent stroke with residual right-sided weakness. He presented to [name] with seizure-like activity and worsening mental status per patient's wife at the bedside.  On admission, patient was hemodynamically stable. Blood work was notable for leukocytosis of 20.4, hemoglobin 9.2, potassium 2.9, creatinine 7.31, INR 2.53.   CT head showed no acute intracranial findings. MRI 4/30 with findings of diffusion only MRI without definitive acute infarct. Patient does have multiple late subacute to chronic infarcts bilaterally. Posterior left frontal diffusion abnormality could potentially reflect sequela of recent seizure activity however superimposed acute ischemia cannot be excluded. MRI repeated 5/1 and it showed findings compatible with the expected evolution of subacute/early chronic left MCA territory infarcts. Pt does have few areas in brain that show remote hemorrhagic infarcts.  Assessment & Plan:   Principal Problem:   Acute metabolic encephalopathy likely due to prolonged post-ictal state and possible underlying infection superimposed on chronic left MCA CVA with residual right upper extremity weakness.  Confusion mostly resolved today - EEG 01/07/2016 - severely abnormal EEG due to the presence of Severe background slowing indicative of bihemispheric dysfunction which is a nonspecific findingseen in a variety of toxic, metabolic or hypoxic etiologies. No definite ongoing seizure activity is noted -  MRI done 2 times, demonstrates chronic left MCA  infarcts along with few ares of remote hemorrhagic infarcts  - Appreciate neurology assistance - Psychiatry consult canceled - He is on lacosamide 100 mg twice daily - Continue daily aspirin   Active Problems:   End-stage renal disease on hemodialysis - Appreciate renal following - Hemodialysis MWF > HD today    DM (diabetes mellitus) type II controlled with renal manifestation on current long-term insulin use (HCC) - Last A1c 12/03/2015 was 6.4, indicating good glycemic control - Continue Lantus 5 units at bedtime along with sliding scale insulin - Seen by DM coordinator  - CBG's in past 24 hours: 106, 104, 171    Essential hypertension / hypotension - Continue Midodrin 2.5 mg twice daily    Chronic diastolic and systolic CHF - Last 2 D ECHO in 10/2015 showed EF 45% with grade 1 diastolic dysfunction - Compensated  - Started low dose metoprolol 12.5 mg Q 12 hours     History of PE and DVT - On AC with coumadin, dosing per pharmacy       Hypokalemia - Due to end-stage renal disease - HD MWF    Chronic atrial fibrillation with HR in the 110s - CHADS vasc score 5 (htn, chf, dm, stroke) - On AC with coumadin - INR 3.25, dosing per pharmacy  - Tolerating low dose metoprolol 12.5 mg Q 12 hours     Leukocytosis, started to trend down on vancomycin, but back up today.  Could be due to UTI or line infection -  Unclear etiology. Chest x-ray showed no acute cardiopulmonary findings - blood cultures NGTD -  Check UA and urine culture -  Continue antibiotics pending urine test    Anemia of chronic renal disease - Hemoglobin stable at 9.2, 9.1 - No reports of bleeding    DVT prophylaxis:  On anticoagulation with Coumadin Code Status: full code  Family Communication: Patient alone Disposition Plan:  Pending urinalysis and WBC trending down. PT/OT recommending CIR.  Awaiting evaluation.    Consultants:   Neurology  Psychiatry   Procedures:   EEG 5/1 - This is a severely  abnormal EEG due to the presence of Severe background slowing indicative of bihemispheric dysfunction which is a nonspecific finding seen in a variety of toxic, metabolic or hypoxic etiologies. No definite ongoing seizure activity is noted  Antimicrobials:   Vancomycin 5/1    Subjective: Patient states thinking is clear and denies current confusion, slurred speech, blurry vision, focal numbness or weakness different than his baseline  Objective: Filed Vitals:   01/09/16 0602 01/09/16 0703 01/09/16 0845 01/09/16 1037  BP: 122/77 134/90  108/69  Pulse: 90 86  92  Temp: 99.1 F (37.3 C) 99.2 F (37.3 C)  98.9 F (37.2 C)  TempSrc: Oral Oral  Oral  Resp: 16 16  18   Height:      Weight:      SpO2: 96% 97% 95% 97%   No intake or output data in the 24 hours ending 01/09/16 1814 Filed Weights   01/06/16 1414 01/08/16 1220 01/08/16 1500  Weight: 90.719 kg (200 lb) 94.1 kg (207 lb 7.3 oz) 94.4 kg (208 lb 1.8 oz)    Examination:  General exam: Appears calm and comfortable, no distress  Respiratory system: Respiratory effort normal. Bilateral air entry, no wheezing  Cardiovascular system: S1 & S2 appreciated, Rate controlled. Gastrointestinal system: (+) BS, non tender abdomen, not distended  Central nervous system: Right upper extremity weakness otherwise nonfocal deficit Extremities: Patient able to lift lower extremities. Right upper extremity weakness noted 4/5.  5/5 left upper extremity Skin: No rashes or ulcers Psychiatry: Oriented to person, place  Data Reviewed: I have personally reviewed following labs and imaging studies  CBC:  Recent Labs Lab 01/06/16 1144 01/06/16 1153 01/07/16 0500 01/08/16 0517 01/09/16 0636  WBC 20.4*  --  18.3* 14.8* 16.1*  NEUTROABS 13.3*  --   --   --   --   HGB 9.2* 11.2* 9.2* 9.1* 10.4*  HCT 25.3* 33.0* 25.1* 25.6* 29.6*  MCV 80.1  --  79.4 78.5 80.7  PLT 420*  --  539* 513* 665*   Basic Metabolic Panel:  Recent Labs Lab  01/06/16 1144 01/06/16 1153 01/07/16 1658  NA 138 139 136  K 2.9* 2.9* 3.3*  CL 102 101 101  CO2 15*  --  22  GLUCOSE 192* 181* 109*  BUN 35* 36* 41*  CREATININE 7.31* 7.30* 7.34*  CALCIUM 7.4*  --  7.0*  PHOS  --   --  5.1*   GFR: Estimated Creatinine Clearance: 13.6 mL/min (by C-G formula based on Cr of 7.34). Liver Function Tests:  Recent Labs Lab 01/06/16 1144 01/07/16 1658  AST 30  --   ALT 14*  --   ALKPHOS 113  --   BILITOT 1.2  --   PROT 8.0  --   ALBUMIN 3.3* 3.3*   No results for input(s): LIPASE, AMYLASE in the last 168 hours. No results for input(s): AMMONIA in the last 168 hours. Coagulation Profile:  Recent Labs Lab 01/06/16 1144 01/07/16 0500 01/08/16 0517 01/09/16 0636  INR 2.53* 2.75* 3.25* 2.47*   Cardiac Enzymes: No results for input(s): CKTOTAL, CKMB, CKMBINDEX, TROPONINI in the last 168 hours. BNP (last 3 results) No results for input(s): PROBNP in the last 8760 hours. HbA1C:  No results for input(s): HGBA1C in the last 72 hours. CBG:  Recent Labs Lab 01/08/16 1622 01/08/16 2120 01/09/16 0656 01/09/16 1156 01/09/16 1656  GLUCAP 133* 196* 120* 200* 135*   Lipid Profile: No results for input(s): CHOL, HDL, LDLCALC, TRIG, CHOLHDL, LDLDIRECT in the last 72 hours. Thyroid Function Tests: No results for input(s): TSH, T4TOTAL, FREET4, T3FREE, THYROIDAB in the last 72 hours. Anemia Panel: No results for input(s): VITAMINB12, FOLATE, FERRITIN, TIBC, IRON, RETICCTPCT in the last 72 hours. Urine analysis:    Component Value Date/Time   COLORURINE YELLOW 12/03/2015 [place] 12/03/2015 1746   LABSPEC 1.009 12/03/2015 1746   PHURINE 5.5 12/03/2015 1746   GLUCOSEU 100* 12/03/2015 1746   HGBUR NEGATIVE 12/03/2015 1746   BILIRUBINUR NEGATIVE 12/03/2015 1746   KETONESUR NEGATIVE 12/03/2015 1746   PROTEINUR 100* 12/03/2015 1746   UROBILINOGEN 0.2 10/01/2014 1124   NITRITE NEGATIVE 12/03/2015 1746   LEUKOCYTESUR NEGATIVE  12/03/2015 1746   Sepsis Labs: @LABRCNTIP (procalcitonin:4,lacticidven:4)  Recent Results (from the past 240 hour(s))  TECHNOLOGIST REVIEW     Status: None   Collection Time: 01/01/16 10:27 AM  Result Value Ref Range Status   Technologist Review few teardrops, ovalos, targets. few large plts.  Final  Culture, blood (routine x 2)     Status: None (Preliminary result)   Collection Time: 01/07/16  8:18 PM  Result Value Ref Range Status   Specimen Description BLOOD LEFT FOREARM  Final   Special Requests BOTTLES DRAWN AEROBIC AND ANAEROBIC 10CC  Final   Culture NO GROWTH 2 DAYS  Final   Report Status PENDING  Incomplete  Culture, blood (routine x 2)     Status: None (Preliminary result)   Collection Time: 01/07/16  8:24 PM  Result Value Ref Range Status   Specimen Description BLOOD LEFT HAND  Final   Special Requests BOTTLES DRAWN AEROBIC AND ANAEROBIC 10CC  Final   Culture NO GROWTH 2 DAYS  Final   Report Status PENDING  Incomplete      Radiology Studies: Ct Head Wo Contrast 01/06/2016  1. No acute intracranial abnormalities. 2. Age advanced brain atrophy. 3. Chronic areas of encephalomalacia noted bilaterally. Electronically Signed   By: Kerby Moors M.D.   On: 01/06/2016 12:03   Mr Jodene Nam Head Wo Contrast 01/06/2016 1. Diffusion only MRI without definite acute infarct identified. Multiple late subacute to chronic infarcts bilaterally as above with associated chronic blood products accounting for diffusion abnormality. Posterior left frontal diffusion abnormality could also potentially reflect the sequelae of recent seizure activity, though a trace amount of superimposed acute ischemia cannot be excluded. 2. Motion degraded MRA without emergent large vessel occlusion. 3. Proximal left M2 branch stenosis (with apparent improved patency compared to prior outside MRA) with progressive attenuation of more distal left MCA branch vessels. 4. New moderate to severe left P1 PCA stenoses, with  assessment limited by motion artifact. Study was reviewed in person with Dr. Leonel Ramsay on 01/06/2016 at 12:30 p.m. Electronically Signed   By: Logan Bores M.D.   On: 01/06/2016 13:53   Dg Chest Port 1 View 01/06/2016  No active disease. Electronically Signed   By: Skipper Cliche M.D.   On: 01/06/2016 15:03   Mr Brain Ltd W/o Cm 01/06/2016   1. Diffusion only MRI without definite acute infarct identified. Multiple late subacute to chronic infarcts bilaterally as above with associated chronic blood products accounting for diffusion abnormality. Posterior left frontal diffusion abnormality could also potentially reflect the sequelae  of recent seizure activity, though a trace amount of superimposed acute ischemia cannot be excluded. 2. Motion degraded MRA without emergent large vessel occlusion. 3. Proximal left M2 branch stenosis (with apparent improved patency compared to prior outside MRA) with progressive attenuation of more distal left MCA branch vessels. 4. New moderate to severe left P1 PCA stenoses, with assessment limited by motion artifact. Study was reviewed in person with Dr. Leonel Ramsay on 01/06/2016 at 12:30 p.m. Electronically Signed   By: Logan Bores M.D.   On: 01/06/2016 13:53    Scheduled Meds: . aspirin EC  81 mg Oral Daily  . fluticasone furoate-vilanterol  1 puff Inhalation Daily  . insulin aspart  0-5 Units Subcutaneous QHS  . insulin aspart  0-9 Units Subcutaneous TID WC  . insulin glargine  5 Units Subcutaneous QHS  . lacosamide  100 mg Oral BID  . metoprolol tartrate  12.5 mg Oral BID  . midodrine  2.5 mg Oral BID WC  . pantoprazole  40 mg Oral BID  . sodium chloride flush  3 mL Intravenous Q12H  . [START ON 01/10/2016] vancomycin  1,000 mg Intravenous Q T,Th,Sa-HD  . Warfarin - Pharmacist Dosing Inpatient   Does not apply q1800   Continuous Infusions:    LOS: 1 day   Time spent: 25 minutes    [name], MD [facility] Pager [phone]  If  7PM-7AM, please contact night-coverage [email or web address] Password [facility] 01/09/2016, 6:14 PM

## 2016-01-09 NOTE — Progress Notes (Signed)
[place] for Warfarin Indication: pulmonary embolus - history  Allergies  Allergen Reactions  . Codeine Rash and Other (See Comments)    Unknown reaction (patient says it was more serious than just a rash, but he can't remember what happened)    Patient Measurements: Height: 5' 10"  (177.8 cm) Weight: 208 lb 1.8 oz (94.4 kg) IBW/kg (Calculated) : 73  Vital Signs: Temp: 98.9 F (37.2 C) (05/03 1037) Temp Source: Oral (05/03 1037) BP: 108/69 mmHg (05/03 1037) Pulse Rate: 92 (05/03 1037)  Labs:  Recent Labs  01/06/16 1144 01/06/16 1153 01/07/16 0500 01/07/16 1658 01/08/16 0517 01/09/16 0636  HGB 9.2* 11.2* 9.2*  --  9.1* 10.4*  HCT 25.3* 33.0* 25.1*  --  25.6* 29.6*  PLT 420*  --  539*  --  513* 487*  APTT 41*  --   --   --   --   --   LABPROT 26.9*  --  28.7*  --  32.5* 26.4*  INR 2.53*  --  2.75*  --  3.25* 2.47*  CREATININE 7.31* 7.30*  --  7.34*  --   --    Estimated Creatinine Clearance: 13.6 mL/min (by C-G formula based on Cr of 7.34).   Assessment: 50yo male with multiple medical problems including history of bilateral PE.  He is on chronic Warfarin therapy for this.  He is admitted today for possible CVA but did not qualify for TPA.  We have been asked to resume his Warfarin therapy.   He is without noted bleeding complications.  Home Dose:  Warfarin 7.35m daily  INR today = 2.47  Goal of Therapy:  INR 2-3 Monitor platelets by anticoagulation protocol: Yes   Plan:  -Coumadin 7.5 mg po x 1 - PT/INR daily  Thank you LAnette Guarneri PharmD [phone] 01/09/2016,11:02 AM

## 2016-01-09 NOTE — Progress Notes (Signed)
STROKE TEAM PROGRESS NOTE  SUBJECTIVE :  i was asked by neurohospitalist team to see this patient upon request from patient`s wife as he is my office patient. He presented with Single  partial onset seizure with secondary generalization and was postictal for the last couple of days and appears to have made gradual improvement. MRI scan of the brain done on admission have personally reviewed shows no acute infarct and expected evolutionary changes of his subacute infarcts. A repeat MRI scan done 2 days later also do not show an acute infarct. EEG read by me shows generalized slowing without definite epileptiform activity. Patient is currently on Vimpat 100 mg twice daily.His lab work and vital signs do not suggest any ongoing infection.  OBJECTIVE:   Filed Vitals:   01/09/16 0703 01/09/16 1037  BP: 134/90 108/69  Pulse: 86 92  Temp: 99.2 F (37.3 C) 98.9 F (37.2 C)  Resp: 16 18     Physical exam  . Afebrile. Head is nontraumatic. Neck is supple without bruit.    Cardiac exam no murmur or gallop. Lungs are clear to auscultation. Distal pulses are well felt.  Neurological exam :   Mental Status: Awake and fully alert. Oriented to place and time. Recent and remote memory intact. Attention span, concentration and fund of knowledge Is slightly impaired. Mood and affect appropriate. Moderate expressive aphasia with word finding difficulties and disfluency. Good comprehension, naming and repetition. Cranial Nerves: Fundoscopic exam reveals sharp disc margins. Pupils equal, briskly reactive to light. Extraocular movements full without nystagmus. Visual fields full to confrontation. Hearing intact. Moderate right lower facial weakness. Facial sensation intact. Face, tongue, palate moves normally and symmetrically.  Motor: Spastic right hemiparesis with 4/5 right shoulder and elbow strength with significant weakness of the right grip and intrinsic hand muscles. 4/5 right lower extremity strength  with spasticity and increased tone. Ankle dorsiflexors week. Sensory.: touch ,pinprick .position and vibratory sensation slightly diminished on the right compared to the left.  Coordination: Impaired on the right side and normal on the left  Gait and Station: Arises from chair without difficulty. Spastic hemiplegic gait with dragging of the right foot and circumduction.  Reflexes: 1+ and symmetric. Toes downgoing. . No results found.   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 in January 2017 secondary to paradoxical embolism from deep and thrombosis, pulmonary embolism and patent foramen ovale while he was off Coumadin due to GI bleeding with prolonged hospitalization in [facility] but was transferred here for rehabilitation he has residual expressive aphasia and spastic right hemiparesis  New onset seizure on 01/06/16 Prolonged postictal phase now recovering close to baseline Vascular Risk Factors:  diabetes, hypertension, sleep apnea  PLAN:   I have personally examined this patient, reviewed notes, independently viewed imaging studies, participated in medical decision making and plan of care. I have made any additions or clarifications directly to the above note. He   remains at risk for neurological worsening, recurrent stroke/TIA and needs ongoing long term anticonvulsants and continue vimpat 100 mg twice daily and aggressive risk factor modification for stroke.Continue seizure precautions. Mobilize out of bed as tolerated with physical occupational therapy. Rehabilitation consult placed.Follow-up as an outpatient in the stroke clinic as scheduled previously.I had a long d/w his wife at bedside and answered questions.   I spent 25  Minutes during this visit with greater than 50% of time spent in counseling and coordination of care with regards to  Seizure and  stroke risk Prevention  Antony Contras, MD Medical  Director [facility] Pager: [phone] 01/09/2016 4:19 PM

## 2016-01-09 NOTE — Progress Notes (Signed)
Unable to obtain UA at this time, as pt does not have urine available. Pt on dialysis and does not make urine regularly. Notified therapy office that patient needs PT eval ASAP for CIR evaluation. Wendee Copp

## 2016-01-09 NOTE — Progress Notes (Signed)
[facility] Progress Note   Subjective: "You're the lady from the dialysis center..." Awake, oriented X 3. Answering questions appropriately. Denies pain/Discomfort.     Objective Filed Vitals:   01/09/16 0602 01/09/16 0703 01/09/16 0845 01/09/16 1037  BP: 122/77 134/90  108/69  Pulse: 90 86  92  Temp: 99.1 F (37.3 C) 99.2 F (37.3 C)  98.9 F (37.2 C)  TempSrc: Oral Oral  Oral  Resp: 16 16  18   Height:      Weight:      SpO2: 96% 97% 95% 97%   Physical Exam General: well nourished, NAD Heart: S1, S2, II/VI systolic M. No JVD Lungs: Bilateral breath sounds CTA Abdomen: abdomen soft non-tender Extremities: No LE edema, no cyanosis, pulse intact Dialysis Access: RIJ perm cath Drsg CDI, RFA AVF maturing + bruit  Additional Objective Labs: Basic Metabolic Panel:  Recent Labs Lab 01/06/16 1144 01/06/16 1153 01/07/16 1658  NA 138 139 136  K 2.9* 2.9* 3.3*  CL 102 101 101  CO2 15*  --  22  GLUCOSE 192* 181* 109*  BUN 35* 36* 41*  CREATININE 7.31* 7.30* 7.34*  CALCIUM 7.4*  --  7.0*  PHOS  --   --  5.1*   Liver Function Tests:  Recent Labs Lab 01/06/16 1144 01/07/16 1658  AST 30  --   ALT 14*  --   ALKPHOS 113  --   BILITOT 1.2  --   PROT 8.0  --   ALBUMIN 3.3* 3.3*   No results for input(s): LIPASE, AMYLASE in the last 168 hours. CBC:  Recent Labs Lab 01/06/16 1144  01/07/16 0500 01/08/16 0517 01/09/16 0636  WBC 20.4*  --  18.3* 14.8* 16.1*  NEUTROABS 13.3*  --   --   --   --   HGB 9.2*  < > 9.2* 9.1* 10.4*  HCT 25.3*  < > 25.1* 25.6* 29.6*  MCV 80.1  --  79.4 78.5 80.7  PLT 420*  --  539* 513* 487*  < > = values in this interval not displayed. Blood Culture    Component Value Date/Time   SDES BLOOD LEFT HAND 01/07/2016 2024   SPECREQUEST BOTTLES DRAWN AEROBIC AND ANAEROBIC 10CC 01/07/2016 2024   CULT NO GROWTH < 24 HOURS 01/07/2016 2024   REPTSTATUS PENDING 01/07/2016 2024    CBG:  Recent Labs Lab 01/08/16 1140  01/08/16 1622 01/08/16 2120 01/09/16 0656 01/09/16 1156  GLUCAP 171* 133* 196* 120* 200*   IStudies/Results: No results found. Medications:   . aspirin EC  81 mg Oral Daily  . cinacalcet  60 mg Oral Q breakfast  . fluticasone furoate-vilanterol  1 puff Inhalation Daily  . insulin aspart  0-5 Units Subcutaneous QHS  . insulin aspart  0-9 Units Subcutaneous TID WC  . insulin glargine  5 Units Subcutaneous QHS  . lacosamide  100 mg Oral BID  . metoprolol tartrate  12.5 mg Oral BID  . midodrine  2.5 mg Oral BID WC  . pantoprazole  40 mg Oral BID  . sodium chloride flush  3 mL Intravenous Q12H  . [START ON 01/10/2016] vancomycin  1,000 mg Intravenous Q T,Th,Sa-HD  . warfarin  7.5 mg Oral ONCE-1800  . Warfarin - Pharmacist Dosing Inpatient   Does not apply q1800   Dialysis orders: Dialysis: [facility] TTS 4h 22mn 89kg 2/2.25 bath RFA AVF maturing (3/28)/ R IJ cath  Venofer 50/ wk Mircera 100 q 2, last 4/19 Calc 0.5 ug  Assessment/Plan: 1. Seizures, new onset w depressed MS - poss drug tox (baclofen, should not be used in dialysis patients at all).  Resolved.   2. ESRD -TTS @[facility] . For HD tomorrow. K+ 3.3. 4.0 K bath.  3. Anemia - HGB 10.4 No ESA ordered. Followed HGB 4. Secondary hyperparathyroidism - currently receiving sensipar VDRA. Phos 5.1. Ca 7.0 C Ca 7.56   Last in-center C Ca 9.2 (12/19/15). Will stop sensipar, give 2 g calcium gluconate IV and follow calcium. Hypocalcemia, poss due to sensipar.  Will hold, give IV 2 gm ca gluc.  5. HTN/volume -HD yesterday. Pre wt 94.1 Net UF Post wt 94.4 kg.OP EDW 89kg.  Will have HD tomorrow. 1.5-2 liters tomorrow.  6. Nutrition - Albumin 3.3 Dys 2 diet. Add renal vit/supplement.  7. H/O Afib. Coumadin resumed per pharmacy. INR 2.47   Rita H. Brown NP-C 01/09/2016, 2:24 PM  [facility] [phone]  Pt seen, examined, agree w assess/plan as above with additions as indicated.  Kelly Splinter MD [employer] pager [phone]    cell [phone] 01/09/2016, 4:03 PM

## 2016-01-09 NOTE — Progress Notes (Signed)
Physical medicine rehabilitation consult requested chart reviewed. Patient presently in diagnostic testing. Physical and occupational therapy evaluations are pending. Will follow-up with appropriate recommendations as evaluations completed

## 2016-01-09 NOTE — Evaluation (Signed)
Physical Therapy Evaluation Patient Details Name: Isaiah [name] [facility] MRN: [id number] DOB: [birth year] Today's Date: 01/09/2016   History of Present Illness  50 y.o. male admitted to [facility] on 01/06/16 for seizure and acute metabolic encepholopathy.  Pt with significant PMHx of DM, sickle cell-thalassemia disease, ESRD on HD MWF, gout, HTN, and A-fib.  Clinical Impression  Pt is weaker than his baseline and is functioning at a mod to heavy min assist with all mobility including gait with SPC.  His speech is more garbled and he is dragging his right foot more per wife compared to baseline.  He would benefit from CIR level therapies to help him return to mod I with cane at home alone during the day while wife works.   PT to follow acutely for deficits listed below.       Follow Up Recommendations CIR    Equipment Recommendations  None recommended by PT    Recommendations for Other Services Rehab consult     Precautions / Restrictions Precautions Precautions: Fall Precaution Comments: due to right hemi      Mobility  Bed Mobility Overal bed mobility: Needs Assistance Bed Mobility: Supine to Sit;Sit to Supine     Supine to sit: Mod assist Sit to supine: Mod assist   General bed mobility comments: Attempted to simulate bed mobility from flat bed with no rail out of left side of the bed like at home, pt unable to get up unassisted. Mod assist to pull to sitting and mod assist to help support both legs to get back to supine.   Transfers Overall transfer level: Needs assistance Equipment used: Straight cane Transfers: Sit to/from Stand Sit to Stand: Min assist;Mod assist;From elevated surface         General transfer comment: Attempted to have pt stand on his own and he was unable, even from elevated bed,  Mod assist from lower bed and min assist from higher bed. Posterior bias throughout transitions with uncontrolled descent to sit and no reach back safety strategies even after verbal cues  to reach back to help lower down.   Ambulation/Gait Ambulation/Gait assistance: Min assist Ambulation Distance (Feet): 85 Feet Assistive device: Straight cane Gait Pattern/deviations: Step-through pattern;Shuffle;Decreased dorsiflexion - right (leans right during gait) Gait velocity: decreased Gait velocity interpretation: Below normal speed for age/gender General Gait Details: Pt with increased right foot drag compared to normal per wife and as he fatigued the R foot drop got worse as well as the right sided lean.  Pt was a heavy min assist for balance and safety during gait with SPC on his left side.           Balance Overall balance assessment: Needs assistance Sitting-balance support: Feet supported;Single extremity supported Sitting balance-Leahy Scale: Poor Sitting balance - Comments: posterior lean EOB, min assist to stay forward Postural control: Posterior lean Standing balance support: Single extremity supported Standing balance-Leahy Scale: Poor Standing balance comment: Min to mod assist when first standing with very wide BOS and significant posterior lean initially.  If not supported, pt would have fallen back into the bed.  Once pt got feet under him and established forward momentum, he was heavy min assist in standing.                              Pertinent Vitals/Pain Pain Assessment: No/denies pain    Home Living Family/patient expects to be discharged to:: Private residence Living Arrangements: Spouse/significant other  Available Help at Discharge: Family;Available PRN/intermittently (wife was back at work monitoring pt via in home cameras) Type of Home: House Home Access: Stairs to enter Entrance Stairs-Rails: None Entrance Stairs-Number of Steps: 2 Home Layout: Two level [place] - single point;Shower seat;Grab bars - tub/shower;Bedside commode      Prior Function Level of Independence: Needs assistance   Gait / Transfers Assistance  Needed: mod I with SPC at all times.  Pt was active in OP neuro rehab last note dated 4/28 and pt was mod I with cane, min guard without.       Comments: Per wife's report she would basically set up everything for him before she went to work and he would handle everything by himself with her watching on a video monitor while at work.      Hand Dominance   Dominant Hand: Right (originally)    Extremity/Trunk Assessment   Upper Extremity Assessment: Defer to OT evaluation           Lower Extremity Assessment: RLE deficits/detail RLE Deficits / Details: right leg with slower motor responses, 3+/5 ankle DF/PF, knee extension 4/5, hip flexion 3+/5    Cervical / Trunk Assessment: Normal  Communication   Communication: Expressive difficulties  Cognition Arousal/Alertness: Awake/alert Behavior During Therapy: WFL for tasks assessed/performed Overall Cognitive Status: Impaired/Different from baseline Area of Impairment: Problem solving             Problem Solving: Slow processing (more than baseline per wife) General Comments: Per wife his speech is also more abnormal than usual.              Assessment/Plan    PT Assessment Patient needs continued PT services  PT Diagnosis Difficulty walking;Abnormality of gait;Generalized weakness;Altered mental status;Hemiplegia dominant side   PT Problem List Decreased strength;Decreased activity tolerance;Decreased balance;Decreased mobility;Decreased coordination;Decreased cognition;Decreased knowledge of use of DME;Decreased safety awareness;Decreased knowledge of precautions;Impaired sensation  PT Treatment Interventions DME instruction;Gait training;Stair training;Functional mobility training;Therapeutic activities;Therapeutic exercise;Balance training;Neuromuscular re-education;Cognitive remediation;Patient/family education;Wheelchair mobility training   PT Goals (Current goals can be found in the Care Plan section) Acute Rehab PT  Goals Patient Stated Goal: to go home PT Goal Formulation: With patient/family Time For Goal Achievement: 01/23/16 Potential to Achieve Goals: Good    Frequency Min 3X/week       End of Session Equipment Utilized During Treatment: Gait belt Activity Tolerance: Patient limited by fatigue Patient left: in bed;with call bell/phone within reach;with bed alarm set           Time: 1025-8527 PT Time Calculation (min) (ACUTE ONLY): 41 min   Charges:   PT Evaluation $PT Eval Moderate Complexity: 1 Procedure PT Treatments $Gait Training: 8-22 mins $Therapeutic Activity: 8-22 mins        Rajendra Spiller B. [place], [place], DPT [phone]   01/09/2016, 4:35 PM

## 2016-01-09 NOTE — Progress Notes (Signed)
Turned and provided perineal care to the patient. Patient found incontinent of urine. Provided clean gown and linens. Condom cath reapplied. callbell in reach.

## 2016-01-10 ENCOUNTER — Encounter: Payer: Self-pay | Admitting: Occupational Therapy

## 2016-01-10 ENCOUNTER — Ambulatory Visit: Payer: Self-pay | Admitting: Physical Therapy

## 2016-01-10 DIAGNOSIS — M6289 Other specified disorders of muscle: Secondary | ICD-10-CM

## 2016-01-10 DIAGNOSIS — G9340 Encephalopathy, unspecified: Secondary | ICD-10-CM

## 2016-01-10 LAB — RENAL FUNCTION PANEL
Albumin: 3 g/dL — ABNORMAL LOW (ref 3.5–5.0)
Anion gap: 11 (ref 5–15)
BUN: 26 mg/dL — ABNORMAL HIGH (ref 6–20)
CO2: 26 mmol/L (ref 22–32)
Calcium: 7.2 mg/dL — ABNORMAL LOW (ref 8.9–10.3)
Chloride: 101 mmol/L (ref 101–111)
Creatinine, Ser: 5.62 mg/dL — ABNORMAL HIGH (ref 0.61–1.24)
GFR calc Af Amer: 12 mL/min — ABNORMAL LOW (ref >60)
GFR calc non Af Amer: 11 mL/min — ABNORMAL LOW (ref >60)
Glucose, Bld: 99 mg/dL (ref 65–99)
Phosphorus: 5.3 mg/dL — ABNORMAL HIGH (ref 2.5–4.6)
Potassium: 4.1 mmol/L (ref 3.5–5.1)
Sodium: 138 mmol/L (ref 135–145)

## 2016-01-10 LAB — GLUCOSE, CAPILLARY
Glucose-Capillary: 99 mg/dL (ref 65–99)
Glucose-Capillary: 229 mg/dL — ABNORMAL HIGH (ref 65–99)
Glucose-Capillary: 131 mg/dL — ABNORMAL HIGH (ref 65–99)
Glucose-Capillary: 123 mg/dL — ABNORMAL HIGH (ref 65–99)

## 2016-01-10 LAB — PROTIME-INR
INR: 2.3 — ABNORMAL HIGH (ref 0.00–1.49)
Prothrombin Time: 25.1 seconds — ABNORMAL HIGH (ref 11.6–15.2)

## 2016-01-10 LAB — CBC
HCT: 27.8 % — ABNORMAL LOW (ref 39.0–52.0)
Hemoglobin: 9.9 g/dL — ABNORMAL LOW (ref 13.0–17.0)
MCH: 27.9 pg (ref 26.0–34.0)
MCHC: 35.6 g/dL (ref 30.0–36.0)
MCV: 78.3 fL (ref 78.0–100.0)
Platelets: 465 10*3/uL — ABNORMAL HIGH (ref 150–400)
RBC: 3.55 MIL/uL — ABNORMAL LOW (ref 4.22–5.81)
RDW: 16.1 % — ABNORMAL HIGH (ref 11.5–15.5)
WBC: 15.7 10*3/uL — ABNORMAL HIGH (ref 4.0–10.5)

## 2016-01-10 MED ORDER — LACOSAMIDE 100 MG PO TABS: 100.0000 mg | ORAL_TABLET | Freq: Two times a day (BID) | ORAL | Status: DC

## 2016-01-10 MED ORDER — INSULIN GLARGINE 100 UNIT/ML SOLOSTAR PEN: 10.0000 [IU] | PEN_INJECTOR | Freq: Every day | SUBCUTANEOUS | Status: DC

## 2016-01-10 MED ORDER — WARFARIN SODIUM 7.5 MG PO TABS
7.5000 mg | ORAL_TABLET | Freq: Once | ORAL | Status: AC
  Administered 2016-01-10: 7.5 mg via ORAL
  Filled 2016-01-10: qty 1

## 2016-01-10 MED ORDER — METOPROLOL TARTRATE 25 MG PO TABS: 12.5000 mg | ORAL_TABLET | Freq: Two times a day (BID) | ORAL | Status: DC

## 2016-01-10 MED FILL — METOPROLOL TARTRATE 25 MG T: 25 | 30 days supply | Qty: 60 | Fill #0

## 2016-01-10 NOTE — Progress Notes (Signed)
STROKE TEAM PROGRESS NOTE  SUBJECTIVE : he remains stable without recurrent seizures.D/w Dr Naaman Plummer who feels pt too good for rehab and may go home and get outpt therapy  OBJECTIVE:   Filed Vitals:   01/10/16 0532 01/10/16 1508  BP: 122/75 117/85  Pulse: 70 74  Temp: 97.6 F (36.4 C) 98 F (36.7 C)  Resp: 17 20     Physical exam  . Afebrile. Head is nontraumatic. Neck is supple without bruit.    Cardiac exam no murmur or gallop. Lungs are clear to auscultation. Distal pulses are well felt.  Neurological exam :   Mental Status: Awake and fully alert. Oriented to place and time. Recent and remote memory intact. Attention span, concentration and fund of knowledge Is slightly impaired. Mood and affect appropriate. Moderate expressive aphasia with word finding difficulties and disfluency. Good comprehension, naming and repetition. Cranial Nerves: Fundoscopic exam reveals sharp disc margins. Pupils equal, briskly reactive to light. Extraocular movements full without nystagmus. Visual fields full to confrontation. Hearing intact. Moderate right lower facial weakness. Facial sensation intact. Face, tongue, palate moves normally and symmetrically.  Motor: Spastic right hemiparesis with 4/5 right shoulder and elbow strength with significant weakness of the right grip and intrinsic hand muscles. 4/5 right lower extremity strength with spasticity and increased tone. Ankle dorsiflexors week. Sensory.: touch ,pinprick .position and vibratory sensation slightly diminished on the right compared to the left.  Coordination: Impaired on the right side and normal on the left  Gait and Station: Arises from chair without difficulty. Spastic hemiplegic gait with dragging of the right foot and circumduction.  Reflexes: 1+ and symmetric. Toes downgoing. . No results found.   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 in January  2017 secondary to paradoxical embolism from deep and thrombosis, pulmonary embolism and patent foramen ovale while he was off Coumadin due to GI bleeding with prolonged hospitalization in [facility] but was transferred here for rehabilitation he has residual expressive aphasia and spastic right hemiparesis  New onset seizure on 01/06/16 Prolonged postictal phase now recovering close to baseline Vascular Risk Factors:  diabetes, hypertension, sleep apnea  PLAN:   I have personally examined this patient, reviewed notes, independently viewed imaging studies, participated in medical decision making and plan of care. I have made any additions or clarifications directly to the above note.  Continue vimpat 100 mg twice daily and aggressive risk factor modification for stroke .Follow-up as an outpatient in the stroke clinic as scheduled previously.I had a long d/w his wife at bedside and answered questions.   I spent 15  Minutes during this visit with greater than 50% of time spent in counseling and coordination of care with regards to  Seizure and  stroke risk Prevention  Antony Contras, MD Medical Director [facility] Pager: [phone] 01/10/2016 3:14 PM

## 2016-01-10 NOTE — Consult Note (Signed)
Physical Medicine and Rehabilitation Consult Reason for Consult:right sided weakness Referring Physician: Leonie Man   HPI: Isaiah [name] is a 50 y.o. 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 Patient lives with spouse in [place]. Used a straight point cane prior to admission. Wife works during the day. 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 Received inpatient rehabilitation services 10/12/2015 until 11/01/2015. Presented 01/06/2016 with witnessed seizure as well as right-sided weakness and aphasia. Cranial CT scan negative for acute changes. Patient did not receive TPA. MRI without definite acute infarct identified 01/06/2016 and repeated 01/07/2016 showing remote infarcts. MRA without emergency large vessel occlusion. EEG showed severe background slowing indicating of bihemispheric dysfunction. No seizure activity. Placed on Vimpat. Remains on chronic Coumadin as prior to admission. Dysphagia #2 thin liquid diet. Physical therapy evaluation completed 01/09/2016 with recommendations of physical medicine rehabilitation consult.   Review of Systems  Constitutional: Negative for fever and chills.  HENT: Negative for hearing loss.   Eyes: Negative for blurred vision and double vision.  Respiratory: Negative for cough.        Shortness of breath with heavy exertion  Cardiovascular: Positive for leg swelling. Negative for chest pain and palpitations.  Gastrointestinal: Positive for constipation. Negative for nausea and vomiting.  Genitourinary: Positive for urgency. Negative for dysuria and  hematuria.  Musculoskeletal: Positive for joint pain.  Skin: Negative for rash.  Neurological: Positive for speech change, seizures and headaches.  Psychiatric/Behavioral: The patient has insomnia.   All other systems reviewed and are negative.  Past Medical History  Diagnosis Date  . Diabetes mellitus without complication ([name])   . Sickle cell-thalassemia disease ([place])     a. Sickle cell trait.  . Pulmonary embolism ([place]) 07/2011    a. Tx with Coumadin for 6 months (unknown cause per patient).  . Hyperkalemia 07/2011  . Acute renal failure ([place]) 07/2011  . Monoclonal gammopathy   . Sleep apnea     a. uses CPAP.  [name] Hypertension   . Gout   . Pulmonary embolism ([place]) 07/2011; 09/27/2014    a. Bilat PE 07/2011 - unclear cause, tx with 6 months Coumadin.;   . Monoclonal gammopathies   . Anemia   . Asthma   . OSA on CPAP   . History of recent blood transfusion 10/27/14    2 Units PRBC's  . ESRD (end stage renal disease) on dialysis ([place])   . Stroke ([place])   . A-fib [facility])    Past Surgical History  Procedure Laterality Date  . Cholecystectomy    . Bone marrow biopsy    . Other surgical history      Retinal surgery  . Pars plana vitrectomy  02/17/2012    Procedure: PARS PLANA VITRECTOMY WITH 25 GAUGE;  Surgeon: Hayden Pedro, MD;  Location: [place];  Service: Ophthalmology;  Laterality: Right;  . Cholecystectomy  1990's?  . Eye surgery Right   . Insertion of dialysis catheter Right 10/16/2015    Procedure: INSERTION OF PALINDROME DIALYSIS CATHETER ;  Surgeon: Elam Dutch, MD;  Location: [place];  Service: Vascular;  Laterality: Right;  . Av fistula placement Right 12/05/2015    Procedure: INSERTION  OF ARTERIOVENOUS (AV) GORE-TEX GRAFT ARM;  Surgeon: Elam Dutch, MD;  Location: [name] OR;  Service: Vascular;  Laterality: Right;   Family History  Problem Relation Age of Onset  . Hypertension Mother   . Diabetes Mother   . Sickle cell anemia Brother   . Diabetes type I Brother    . Kidney disease Brother   . Stroke Father    Social History:  reports that he has never smoked. He has never used smokeless tobacco. He reports that he does not drink alcohol or use illicit drugs. Allergies:  Allergies  Allergen Reactions  . Codeine Rash and Other (See Comments)    Unknown reaction (patient says it was more serious than just a rash, but he can't remember what happened)   Medications Prior to Admission  Medication Sig Dispense Refill  . aspirin EC 81 MG tablet Take 1 tablet (81 mg total) by mouth daily. 100 tablet 0  . baclofen (LIORESAL) 10 MG tablet Take 1 tablet (10 mg total) by mouth 3 (three) times daily. Start with 1/2 tablet three times daily x 2 weeks then one tablet three times daily (Patient taking differently: Take 10 mg by mouth 3 (three) times daily. ) 30 each 3  . cetirizine (ZYRTEC) 10 MG tablet Take 10 mg by mouth daily as needed for allergies.     [name] epoetin alfa (EPOGEN,PROCRIT) 95093 UNIT/ML injection Inject 5,000 Units into the skin once a week.     . fluticasone furoate-vilanterol (BREO ELLIPTA) 100-25 MCG/INH AEPB Inhale 1 puff into the lungs daily. 60 each 1  . Insulin Glargine (LANTUS) 100 UNIT/ML Solostar Pen Inject 25 Units into the skin daily at 10 pm. 15 mL 1  . midodrine (PROAMATINE) 2.5 MG tablet Take 1 tablet (2.5 mg total) by mouth 2 (two) times daily with a meal. 60 tablet 1  . multivitamin (RENA-VIT) TABS tablet Take 1 tablet by mouth at bedtime. 30 tablet 1  . NEEDLE, DISP, 27 G (BD DISP NEEDLES) 27G X 1/2" MISC 1 application by Does not apply route daily after supper. 100 each 0  . pantoprazole (PROTONIX) 40 MG tablet Take 1 tablet (40 mg total) by mouth 2 (two) times daily. 60 tablet 1  . SENSIPAR 60 MG tablet Take 60 mg by mouth daily.  10  . traMADol (ULTRAM) 50 MG tablet Take 1 tablet (50 mg total) by mouth every 6 (six) hours as needed for moderate pain. 30 tablet 0  . warfarin (COUMADIN) 5 MG tablet Take 1.5 tablets (7.5 mg total) by  mouth daily at 6 PM. 45 tablet 1    Home: Home Living Family/patient expects to be discharged to:: Private residence Living Arrangements: Spouse/significant other Available Help at Discharge: Family, Available PRN/intermittently (wife was back at work monitoring pt via in home cameras) Type of Home: House Home Access: Stairs to enter Technical brewer of Steps: 2 Entrance Stairs-Rails: None Home Layout: Two level Alternate Level Stairs-Number of Steps: 12 Alternate Level Stairs-Rails: Right, Left (right side only after first 5 steps) Home Equipment: Cane - single point, Shower seat, Grab bars - tub/shower, Bedside commode  Lives With: Family  Functional History: Prior Function Level of Independence: Needs assistance Gait / Transfers Assistance Needed: mod I with SPC at all times.  Pt was active in OP neuro rehab last note dated 4/28 and pt was mod I with cane, min guard without.   Comments: Per wife's report she would basically set up everything for him before she  went to work and he would handle everything by himself with her watching on a video monitor while at work.  Functional Status:  Mobility: Bed Mobility Overal bed mobility: Needs Assistance Bed Mobility: Supine to Sit, Sit to Supine Supine to sit: Mod assist Sit to supine: Mod assist General bed mobility comments: Attempted to simulate bed mobility from flat bed with no rail out of left side of the bed like at home, pt unable to get up unassisted. Mod assist to pull to sitting and mod assist to help support both legs to get back to supine.  Transfers Overall transfer level: Needs assistance Equipment used: Straight cane Transfers: Sit to/from Stand Sit to Stand: Min assist, Mod assist, From elevated surface General transfer comment: Attempted to have pt stand on his own and he was unable, even from elevated bed,  Mod assist from lower bed and min assist from higher bed. Posterior bias throughout transitions with  uncontrolled descent to sit and no reach back safety strategies even after verbal cues to reach back to help lower down.  Ambulation/Gait Ambulation/Gait assistance: Min assist Ambulation Distance (Feet): 85 Feet Assistive device: Straight cane Gait Pattern/deviations: Step-through pattern, Shuffle, Decreased dorsiflexion - right (leans right during gait) General Gait Details: Pt with increased right foot drag compared to normal per wife and as he fatigued the R foot drop got worse as well as the right sided lean.  Pt was a heavy min assist for balance and safety during gait with SPC on his left side.   Gait velocity: decreased Gait velocity interpretation: Below normal speed for age/gender    ADL:    Cognition: Cognition Overall Cognitive Status: Impaired/Different from baseline Arousal/Alertness: Lethargic Orientation Level: Oriented X4 Attention: Focused, Sustained Focused Attention: Impaired Focused Attention Impairment: Verbal basic, Functional basic Sustained Attention: Impaired Sustained Attention Impairment: Verbal basic, Functional basic Memory:  (unable to assess) Awareness: Impaired Awareness Impairment: Intellectual impairment Problem Solving: Impaired Problem Solving Impairment: Functional basic Behaviors: Perseveration Cognition Arousal/Alertness: Awake/alert Behavior During Therapy: WFL for tasks assessed/performed Overall Cognitive Status: Impaired/Different from baseline Area of Impairment: Problem solving Problem Solving: Slow processing (more than baseline per wife) General Comments: Per wife his speech is also more abnormal than usual.   Blood pressure 122/75, pulse 70, temperature 97.6 F (36.4 C), temperature source Oral, resp. rate 17, height _0  (1.778 m), weight 94.4 kg (208 lb 1.8 oz), SpO2 94 %. Physical Exam  HENT:  Head: Normocephalic.  Eyes:  Pupils round reactive to light  Neck: Normal range of motion. Neck supple. No thyromegaly present.   Cardiovascular: Normal rate and regular rhythm.   Respiratory: Effort normal and breath sounds normal.  GI: Soft. Bowel sounds are normal. He exhibits no distension.  Neurological:  Speech appeared a bit dysarthric but intelligible. Mood is flat but appropriate. Makes good eye contact with examiner. He needed prompting for his age. He was able to provide Place. Followed simple commands. RUE: 2+ shoulder, 3/5 distally. RLE: 3-HF to 3 to 3+ KE and ADF/PF. Good insight and awareness  Skin: Skin is warm and dry.  Psychiatric: He has a normal mood and affect. His behavior is normal.    Results for orders placed or performed during the hospital encounter of 01/06/16 (from the past 24 hour(s))  Protime-INR     Status: Abnormal   Collection Time: 01/09/16  6:36 AM  Result Value Ref Range   Prothrombin Time 26.4 (H) 11.6 - 15.2 seconds   INR 2.47 (H) 0.00 -  1.49  CBC     Status: Abnormal   Collection Time: 01/09/16  6:36 AM  Result Value Ref Range   WBC 16.1 (H) 4.0 - 10.5 K/uL   RBC 3.67 (L) 4.22 - 5.81 MIL/uL   Hemoglobin 10.4 (L) 13.0 - 17.0 g/dL   HCT 29.6 (L) 39.0 - 52.0 %   MCV 80.7 78.0 - 100.0 fL   MCH 28.3 26.0 - 34.0 pg   MCHC 35.1 30.0 - 36.0 g/dL   RDW 16.6 (H) 11.5 - 15.5 %   Platelets 487 (H) 150 - 400 K/uL  Glucose, capillary     Status: Abnormal   Collection Time: 01/09/16  6:56 AM  Result Value Ref Range   Glucose-Capillary 120 (H) 65 - 99 mg/dL  Glucose, capillary     Status: Abnormal   Collection Time: 01/09/16 11:56 AM  Result Value Ref Range   Glucose-Capillary 200 (H) 65 - 99 mg/dL  Glucose, capillary     Status: Abnormal   Collection Time: 01/09/16  4:56 PM  Result Value Ref Range   Glucose-Capillary 135 (H) 65 - 99 mg/dL  Glucose, capillary     Status: Abnormal   Collection Time: 01/09/16  9:13 PM  Result Value Ref Range   Glucose-Capillary 175 (H) 65 - 99 mg/dL   Comment 1 Notify RN    Comment 2 Document in Chart    No results  found.  Assessment/Plan: Diagnosis: Hx of bilateral circulation infarcts, admitted with seizure 1. Does the need for close, 24 hr/day medical supervision in concert with the patient's rehab needs make it unreasonable for this patient to be served in a less intensive setting? No 2. Co-Morbidities requiring supervision/potential complications: ESRD, HTN 3. Due to bowel management, safety, skin/wound care, disease management and medication administration, does the patient require 24 hr/day rehab nursing? No 4. Does the patient require coordinated care of a physician, rehab nurse, PT, OT to address physical and functional deficits in the context of the above medical diagnosis(es)? No Addressing deficits in the following areas: balance, endurance, locomotion, strength, transferring, bowel/bladder control, bathing, dressing, feeding, grooming and toileting 5. Can the patient actively participate in an intensive therapy program of at least 3 hrs of therapy per day at least 5 days per week? Potentially 6. The potential for patient to make measurable gains while on inpatient rehab is n/a 7. Anticipated functional outcomes upon discharge from inpatient rehab are n/a  with PT, n/a with OT, n/a with SLP. 8. Estimated rehab length of stay to reach the above functional goals is: n/a 9. Does the patient have adequate social supports and living environment to accommodate these discharge functional goals? Yes 10. Anticipated D/C setting: Home 11. Anticipated post D/C treatments: Outpatient therapy 12. Overall Rehab/Functional Prognosis: excellent  RECOMMENDATIONS: This patient's condition is appropriate for continued rehabilitative care in the following setting: Outpatient Therapy Patient has agreed to participate in recommended program. Yes Note that insurance prior authorization may be required for reimbursement for recommended care.  Comment: Pt is returning to baseline. Patient and wife both feel that he's  improving and want to resume outpatient therapy which he was participating in prior to this admission.   Meredith Staggers, MD, [place] [facility] 01/10/2016     01/10/2016

## 2016-01-10 NOTE — Progress Notes (Signed)
Rehab admissions - Please see rehab consult done by Dr. Naaman Plummer recommending outpatient therapy.  Patient likely to progress to be able to discharge home without need for CIR.  Call me for questions.  #[phone]

## 2016-01-10 NOTE — Progress Notes (Signed)
CSW was asked by patient's wife to send out a referral for SNF as a backup plan to home. CSW spoke with patient and patient's mother and both stated they wanted patient to go home at discharge. Patient had CSW call his wife to confirm that he would be going home. Patient's wife agreed with plan.  CSW signing off.  Percell Locus Jayleana Colberg LCSWA [phone]

## 2016-01-10 NOTE — NC FL2 (Signed)
[place] MEDICAID FL2 LEVEL OF CARE SCREENING TOOL     IDENTIFICATION  Patient Name: Isaiah [name] [facility] Birthdate: [birth year] Sex: male Admission Date (Current Location): 01/06/2016  [facility] and [place] Number:  Herbalist and Address:  [facility] [place] [address], [place], [place] [address]      Provider Number: [id number]  Attending Physician Name and Address:  [name], MD  Relative Name and Phone Number:  Christena Deem, spouse, [phone]    Current Level of Care: Hospital Recommended Level of Care: [place] Prior Approval Number:    Date Approved/Denied:   PASRR Number: [id number]  Discharge Plan: SNF    Current Diagnoses: Patient Active Problem List   Diagnosis Date Noted  . Acute encephalopathy 01/08/2016  . Seizures ([place]) 01/08/2016  . History of ischemic left MCA stroke 01/08/2016  . Right sided weakness 01/08/2016  . Chronic combined systolic and diastolic CHF (congestive heart failure) ([place]) 01/08/2016  . Atrial fibrillation ([place]) 01/08/2016  . Leukocytosis 01/08/2016  . Benign essential HTN 01/08/2016  . Anemia of chronic disease 01/08/2016  . Controlled type 2 diabetes mellitus with diabetic nephropathy, without long-term current use of insulin ([place]) 01/08/2016  . History of DVT (deep vein thrombosis) 01/08/2016  . ESRD (end stage renal disease) on dialysis ([place])     Orientation RESPIRATION BLADDER Height & Weight     Self, Time, Situation, Place  Normal Incontinent Weight: 208 lb 1.8 oz (94.4 kg) Height:  5' 10"  (177.8 cm)  BEHAVIORAL SYMPTOMS/MOOD NEUROLOGICAL BOWEL NUTRITION STATUS      Continent Diet (Please see DC summary)  AMBULATORY STATUS COMMUNICATION OF NEEDS Skin   Limited Assist Verbally Normal                       Personal Care Assistance Level of Assistance  Bathing, Feeding, Dressing Bathing Assistance: Maximum assistance Feeding assistance: Independent Dressing  Assistance: Limited assistance     Functional Limitations Info             SPECIAL CARE FACTORS FREQUENCY  PT (By licensed PT)     PT Frequency: min 3x/week              Contractures      Additional Factors Info  Code Status, Allergies, Insulin Sliding Scale Code Status Info: Full Allergies Info: Codeine   Insulin Sliding Scale Info: insulin aspart (novoLOG) injection 0-5 Units;insulin aspart (novoLOG) injection 0-9 Units;insulin glargine (LANTUS) injection 5 Units;       Current Medications (01/10/2016):  This is the current hospital active medication list Current Facility-Administered Medications  Medication Dose Route Frequency Provider Last Rate Last Dose  . acetaminophen (TYLENOL) tablet 650 mg  650 mg Oral Q6H PRN Radene Gunning, NP       Or  . acetaminophen (TYLENOL) suppository 650 mg  650 mg Rectal Q6H PRN Radene Gunning, NP      . aspirin EC tablet 81 mg  81 mg Oral Daily Lezlie Octave Black, NP   81 mg at 01/10/16 1032  . fluticasone furoate-vilanterol (BREO ELLIPTA) 100-25 MCG/INH 1 puff  1 puff Inhalation Daily Radene Gunning, NP   1 puff at 01/10/16 [phone]  . insulin aspart (novoLOG) injection 0-5 Units  0-5 Units Subcutaneous QHS Radene Gunning, NP   Stopped at 01/08/16 2247  . insulin aspart (novoLOG) injection 0-9 Units  0-9 Units Subcutaneous TID WC Radene Gunning, NP   3 Units  at 01/10/16 1206  . insulin glargine (LANTUS) injection 5 Units  5 Units Subcutaneous QHS Robbie Lis, MD   5 Units at 01/09/16 2159  . lacosamide (VIMPAT) tablet 100 mg  100 mg Oral BID Greta Doom, MD   100 mg at 01/10/16 1032  . LORazepam (ATIVAN) injection 1 mg  1 mg Intravenous Q6H PRN Robbie Lis, MD   1 mg at 01/08/16 0504  . metoprolol tartrate (LOPRESSOR) tablet 12.5 mg  12.5 mg Oral BID Robbie Lis, MD   12.5 mg at 01/10/16 1031  . midodrine (PROAMATINE) tablet 2.5 mg  2.5 mg Oral BID WC Radene Gunning, NP   2.5 mg at 01/10/16 0816  . ondansetron (ZOFRAN) tablet 4 mg  4  mg Oral Q6H PRN Radene Gunning, NP       Or  . ondansetron [facility]) injection 4 mg  4 mg Intravenous Q6H PRN Radene Gunning, NP      . pantoprazole (PROTONIX) EC tablet 40 mg  40 mg Oral BID Radene Gunning, NP   40 mg at 01/10/16 1032  . RESOURCE THICKENUP CLEAR   Oral PRN Robbie Lis, MD      . sodium chloride flush (NS) 0.9 % injection 3 mL  3 mL Intravenous Q12H Radene Gunning, NP   3 mL at 01/10/16 1033  . vancomycin (VANCOCIN) IVPB 1000 mg/200 mL premix  1,000 mg Intravenous Q T,Th,Sa-HD [name], MD   1,000 mg at 01/10/16 1246  . warfarin (COUMADIN) tablet 7.5 mg  7.5 mg Oral ONCE-1800 [name], MD      . Warfarin - Pharmacist Dosing Inpatient   Does not apply [place]         Discharge Medications: Please see discharge summary for a list of discharge medications.  Relevant Imaging Results:  Relevant Lab Results:   Additional Information SSN: [id number]  Benard Halsted, LCSWA

## 2016-01-10 NOTE — Progress Notes (Signed)
[place] for Warfarin Indication: pulmonary embolus - history  Allergies  Allergen Reactions  . Codeine Rash and Other (See Comments)    Unknown reaction (patient says it was more serious than just a rash, but he can't remember what happened)    Patient Measurements: Height: 5' 10"  (177.8 cm) Weight: 208 lb 1.8 oz (94.4 kg) IBW/kg (Calculated) : 73  Vital Signs: Temp: 97.6 F (36.4 C) (05/04 0532) Temp Source: Oral (05/04 0532) BP: 122/75 mmHg (05/04 0532) Pulse Rate: 70 (05/04 0532)  Labs:  Recent Labs  01/07/16 1658  01/08/16 0517 01/09/16 0636 01/10/16 0651 01/10/16 0708  HGB  --   < > 9.1* 10.4*  --  9.9*  HCT  --   --  25.6* 29.6*  --  27.8*  PLT  --   --  513* 487*  --  465*  LABPROT  --   --  32.5* 26.4*  --  25.1*  INR  --   --  3.25* 2.47*  --  2.30*  CREATININE 7.34*  --   --   --  5.62*  --   < > = values in this interval not displayed. Estimated Creatinine Clearance: 17.7 mL/min (by C-G formula based on Cr of 5.62).   Assessment: 50yo male with multiple medical problems including history of bilateral PE.  He is on chronic Warfarin therapy for this.  He is admitted today for possible CVA but did not qualify for TPA.  We have been asked to resume his Warfarin therapy.   He is without noted bleeding complications.  Home Dose:  Warfarin 7.53m daily  INR today = 2.3  Goal of Therapy:  INR 2-3 Monitor platelets by anticoagulation protocol: Yes   Plan:  -Coumadin 7.5 mg po x 1 - PT/INR daily  Thank you LAnette Guarneri PharmD [phone] 01/10/2016,11:24 AM

## 2016-01-10 NOTE — Care Management Note (Signed)
Case Management Note  Patient Details  Name: Calib Wadhwa [facility] MRN: [id number] Date of Birth: 04/07/1966  Subjective/Objective:                    Action/Plan: Outpatient therapy recommended by CIR. CM continuing to follow for discharge needs.  Expected Discharge Date:                  Expected Discharge Plan:     In-House Referral:     Discharge planning Services     Post Acute Care Choice:    Choice offered to:     DME Arranged:    DME Agency:     HH Arranged:    HH Agency:     Status of Service:  In process, will continue to follow  Medicare Important Message Given:    Date Medicare IM Given:    Medicare IM give by:    Date Additional Medicare IM Given:    Additional Medicare Important Message give by:     If discussed at [place] of Stay Meetings, dates discussed:    Additional Comments:  Pollie Friar, RN 01/10/2016, 11:50 AM

## 2016-01-10 NOTE — Progress Notes (Signed)
[facility] Progress Note   Subjective:  Doing well, eating 100 %     Objective Filed Vitals:   01/09/16 2132 01/10/16 0121 01/10/16 0532 01/10/16 0943  BP: 119/91 102/73 122/75   Pulse: 81 63 70   Temp: 97.9 F (36.6 C) 97.5 F (36.4 C) 97.6 F (36.4 C)   TempSrc: Axillary Axillary Oral   Resp: 17 18 17    Height:      Weight:      SpO2: 97% 99% 94% 96%   Physical Exam General: well nourished, NAD Heart: S1, S2, II/VI systolic M. No JVD Lungs: Bilateral breath sounds CTA Abdomen: abdomen soft non-tender Extremities: No LE edema, no cyanosis, pulse intact Dialysis Access: RIJ perm cath Drsg CDI, RFA AVF maturing + bruit  Additional Objective Labs: Basic Metabolic Panel:  Recent Labs Lab 01/06/16 1144 01/06/16 1153 01/07/16 1658 01/10/16 0651  NA 138 139 136 138  K 2.9* 2.9* 3.3* 4.1  CL 102 101 101 101  CO2 15*  --  22 26  GLUCOSE 192* 181* 109* 99  BUN 35* 36* 41* 26*  CREATININE 7.31* 7.30* 7.34* 5.62*  CALCIUM 7.4*  --  7.0* 7.2*  PHOS  --   --  5.1* 5.3*   Liver Function Tests:  Recent Labs Lab 01/06/16 1144 01/07/16 1658 01/10/16 0651  AST 30  --   --   ALT 14*  --   --   ALKPHOS 113  --   --   BILITOT 1.2  --   --   PROT 8.0  --   --   ALBUMIN 3.3* 3.3* 3.0*   No results for input(s): LIPASE, AMYLASE in the last 168 hours. CBC:  Recent Labs Lab 01/06/16 1144  01/07/16 0500 01/08/16 0517 01/09/16 0636 01/10/16 0708  WBC 20.4*  --  18.3* 14.8* 16.1* 15.7*  NEUTROABS 13.3*  --   --   --   --   --   HGB 9.2*  < > 9.2* 9.1* 10.4* 9.9*  HCT 25.3*  < > 25.1* 25.6* 29.6* 27.8*  MCV 80.1  --  79.4 78.5 80.7 78.3  PLT 420*  --  539* 513* 487* 465*  < > = values in this interval not displayed. Blood Culture    Component Value Date/Time   SDES BLOOD LEFT HAND 01/07/2016 2024   SPECREQUEST BOTTLES DRAWN AEROBIC AND ANAEROBIC 10CC 01/07/2016 2024   CULT NO GROWTH 2 DAYS 01/07/2016 2024   REPTSTATUS PENDING 01/07/2016 2024     CBG:  Recent Labs Lab 01/09/16 1156 01/09/16 1656 01/09/16 2113 01/10/16 0618 01/10/16 1141  GLUCAP 200* 135* 175* 99 229*   IStudies/Results: No results found. Medications:   . aspirin EC  81 mg Oral Daily  . fluticasone furoate-vilanterol  1 puff Inhalation Daily  . insulin aspart  0-5 Units Subcutaneous QHS  . insulin aspart  0-9 Units Subcutaneous TID WC  . insulin glargine  5 Units Subcutaneous QHS  . lacosamide  100 mg Oral BID  . metoprolol tartrate  12.5 mg Oral BID  . midodrine  2.5 mg Oral BID WC  . pantoprazole  40 mg Oral BID  . sodium chloride flush  3 mL Intravenous Q12H  . vancomycin  1,000 mg Intravenous Q T,Th,Sa-HD  . warfarin  7.5 mg Oral ONCE-1800  . Warfarin - Pharmacist Dosing Inpatient   Does not apply q1800   Dialysis orders: Dialysis: [facility] TTS 4h 7mn 89kg 2/2.25 bath RFA AVF maturing (3/28)/ R IJ  cath  Venofer 50/ wk Mircera 100 q 2, last 4/19 Calc 0.5 ug   Assessment: 1. Seizures, new onset w depressed MS, suspected drug tox (baclofen, should not be used in dialysis patients at all).  Resolved.   2. ESRD -TTS usual HD 3. Anemia - HGB 10.4 No ESA ordered. Followed HGB 4. Secondary hyperparathyroidism - currently receiving sensipar VDRA. Phos 5.1. Ca 7.0 C Ca 7.56   Last in-center C Ca 9.2 (12/19/15).  Here hypocalcemic, stopped sensipar and gave IV Ca++ 5/3.   5. HTN/volume -HD yesterday. Pre wt 94.1 Net UF Post wt 94.4 kg.OP EDW 89kg.  Will have HD tomorrow. 1.5-2 liters tomorrow.  6. Nutrition - Albumin 3.3 Dys 2 diet. Add renal vit/supplement.  7. H/O Afib. Coumadin resumed per pharmacy. INR 2.47 8. dispo - OK for dc from renal standpoint.   Plan - short HD tomorrow then usual HD Sat.      Kelly Splinter MD [employer] pager [phone]    cell [phone] 01/10/2016, 1:22 PM

## 2016-01-10 NOTE — Care Management Note (Signed)
Case Management Note  Patient Details  Name: Isaiah [name] [facility] MRN: [id number] Date of Birth: 1966/06/27  Subjective/Objective:                    Action/Plan: Patient discharging home with [facility] services. CM met with the patient and provided him a list of [place] agencies in the [place] area. He called his wife and asked her which agency he has used in the past. He relayed that he has used [facility] in the past. CM got on the phone with the patients wife and asked if she would like to use this agency again. She asked to use [facility]. CM notified Tiffany with [place] and she accepted the referral. Will update the bedside RN.   Expected Discharge Date:                  Expected Discharge Plan:  [place]  In-House Referral:     Discharge planning Services  CM Consult  Post Acute Care Choice:  Home Health Choice offered to:  Patient, Spouse  DME Arranged:    DME Agency:     HH Arranged:  PT, OT, Nurse's Aide [place] Agency:  [facility]  Status of Service:  Completed, signed off  Medicare Important Message Given:    Date Medicare IM Given:    Medicare IM give by:    Date Additional Medicare IM Given:    Additional Medicare Important Message give by:     If discussed at [place] of Stay Meetings, dates discussed:    Additional Comments:  Pollie Friar, RN 01/10/2016, 3:46 PM

## 2016-01-10 NOTE — Evaluation (Signed)
Occupational Therapy Evaluation Patient Details Name: Isaiah [name] [facility] MRN: [id number] DOB: 1965/10/31 Today's Date: 01/10/2016    History of Present Illness 50 y.o. male admitted to [facility] on 01/06/16 for seizure and acute metabolic encepholopathy.  Pt with significant PMHx of DM, sickle cell-thalassemia disease, ESRD on HD MWF, gout, HTN, and A-fib.   Clinical Impression   Pt reports he was managing dressing independently and his wife was assisting with bathing PTA. Currently pt overall min assist for functional mobility and ADLs. Pt noted to have increased edema in his R hand and digits; educated on edema management techniques. Provided pt with red foam for increased independence with self feeding. Recommending CIR for follow up in order to maximize independence and safety with ADLs and functional mobility prior to return home. Pt would benefit from continued skilled OT to address established goals.    Follow Up Recommendations  CIR;Supervision/Assistance - 24 hour    Equipment Recommendations  None recommended by OT    Recommendations for Other Services Rehab consult     Precautions / Restrictions Precautions Precautions: Fall Precaution Comments: due to right hemi Restrictions Weight Bearing Restrictions: No      Mobility Bed Mobility Overal bed mobility: Needs Assistance Bed Mobility: Supine to Sit;Sit to Supine     Supine to sit: Min assist;HOB elevated (pt pulling up with therapists hand) Sit to supine: Min assist   General bed mobility comments: Pt able to manage LEs independently. Assist to pull trunk into sitting and controlled descent to supine.   Transfers Overall transfer level: Needs assistance Equipment used: Rolling walker (2 wheeled) Transfers: Sit to/from Stand Sit to Stand: Min assist         General transfer comment: Min assist to boost up from EOB. VCs for hand placement and technique. Pt able to grip RW with R hand for functional mobility using RW  for support.    Balance Overall balance assessment: Needs assistance Sitting-balance support: Feet supported;No upper extremity supported Sitting balance-Leahy Scale: Fair     Standing balance support: During functional activity;Single extremity supported Standing balance-Leahy Scale: Fair Standing balance comment: Pt able to stand at sink and complete grooming activities with one UE supported by sink                            ADL Overall ADL's : Needs assistance/impaired Eating/Feeding: Set up;With adaptive utensils;Sitting Eating/Feeding Details (indicate cue type and reason): Provided pt with red foam to build up handles of utensils. Grooming: Wash/dry face;Min guard;Standing Grooming Details (indicate cue type and reason): Close min guard for safety. Upper Body Bathing: Minimal assitance;Sitting   Lower Body Bathing: Moderate assistance;Sit to/from stand   Upper Body Dressing : Minimal assistance;Sitting Upper Body Dressing Details (indicate cue type and reason): to doff/don hospital gown. Lower Body Dressing: Minimal assistance;Sit to/from stand Lower Body Dressing Details (indicate cue type and reason): Pt able to pull up L sock sitting EOB but difficulty with R.  Toilet Transfer: Minimal assistance;Ambulation;BSC;RW (BSC over toilet) Toilet Transfer Details (indicate cue type and reason): Simulated by sit to stand from EOB. Toileting- Clothing Manipulation and Hygiene: Minimal assistance;Sit to/from stand       Functional mobility during ADLs: Minimal assistance;Rolling walker General ADL Comments: Educated pt on edema management techniques for R hand and digits, proper positioning of R UE, incorporating R UE into functional activities.     Vision     Perception     Praxis  Pertinent Vitals/Pain Pain Assessment: No/denies pain     Hand Dominance Right   Extremity/Trunk Assessment Upper Extremity Assessment Upper Extremity Assessment: RUE  deficits/detail;LUE deficits/detail RUE Deficits / Details: Shoulder 3/5, elbow flex 4/5, ext 4/5. Decreased grip strength. Limited shoulder AROM ~90 degrees, PROM ~110 degrees;, full elbow ROM. Increased edema in R digits and hand. Pt reports no changes in sensation. RUE Coordination: decreased fine motor;decreased gross motor LUE Deficits / Details: Generalized weakness; 4/5. AROM WFL.   Lower Extremity Assessment Lower Extremity Assessment: Defer to PT evaluation   Cervical / Trunk Assessment Cervical / Trunk Assessment: Normal   Communication Communication Communication: Expressive difficulties   Cognition Arousal/Alertness: Awake/alert Behavior During Therapy: WFL for tasks assessed/performed Overall Cognitive Status: No family/caregiver present to determine baseline cognitive functioning                 General Comments: Speech impaired    General Comments       Exercises       Shoulder Instructions      Home Living Family/patient expects to be discharged to:: Unsure Living Arrangements: Spouse/significant other Available Help at Discharge: Family;Available PRN/intermittently (wife works during the day) Type of Home: House Home Access: Stairs to enter [employer] of Steps: 2 Entrance Stairs-Rails: None Home Layout: Two level;Bed/bath upstairs Alternate Level Stairs-Number of Steps: 12 Alternate Level Stairs-Rails: Right;Left Bathroom Shower/Tub: Tub/shower unit;Walk-in shower   Bathroom Toilet: Standard Bathroom Accessibility: Yes How Accessible: Accessible via walker Home Equipment: Cane - single point;Shower seat;Grab bars - tub/shower;Bedside commode          Prior Functioning/Environment Level of Independence: Needs assistance  Gait / Transfers Assistance Needed: mod I with SPC at all times.  Pt was active in OP neuro rehab last note dated 4/28 and pt was mod I with cane, min guard without.   ADL's / Homemaking Assistance Needed: Wife  has been assisting with sponge bathing; no showers. Pt reports he completed dressing independently PTA.   Comments: Per PT eval; wife reports she would basically set up everything for him before she went to work and he would handle everything by himself with her watching on a video monitor while at work.     OT Diagnosis: Generalized weakness;Hemiplegia dominant side   OT Problem List: Decreased strength;Decreased range of motion;Decreased activity tolerance;Impaired balance (sitting and/or standing);Decreased coordination;Decreased knowledge of use of DME or AE;Decreased knowledge of precautions;Obesity;Impaired UE functional use;Increased edema   OT Treatment/Interventions: Self-care/ADL training;Therapeutic exercise;Neuromuscular education;Energy conservation;DME and/or AE instruction;Therapeutic activities;Patient/family education;Balance training    OT Goals(Current goals can be found in the care plan section) Acute Rehab OT Goals Patient Stated Goal: to go home OT Goal Formulation: With patient/family Time For Goal Achievement: 01/24/16 Potential to Achieve Goals: Good ADL Goals Pt Will Perform Grooming: with supervision;standing Pt Will Perform Upper Body Dressing: with supervision;sitting Pt Will Perform Lower Body Dressing: with supervision;sit to/from stand Pt Will Transfer to Toilet: with supervision;ambulating;bedside commode (BSC over toilet) Pt Will Perform Toileting - Clothing Manipulation and hygiene: with supervision;sit to/from stand Pt/caregiver will Perform Home Exercise Program: Increased ROM;Right Upper extremity;Independently;With theraputty;With written HEP provided (increase fine motor coordination)  OT Frequency: Min 2X/week   Barriers to D/C:            Co-evaluation              End of Session Equipment Utilized During Treatment: Gait belt;Rolling walker  Activity Tolerance: Patient tolerated treatment well Patient left: in bed;with call bell/phone  within  reach;with bed alarm set;with family/visitor present;with SCD's reapplied   Time: 2004-1593 OT Time Calculation (min): 33 min Charges:  OT General Charges $OT Visit: 1 Procedure OT Evaluation $OT Eval Moderate Complexity: 1 Procedure OT Treatments $Self Care/Home Management : 8-22 mins G-Codes:     Binnie Kand M.S., OTR/L Pager: [phone]  01/10/2016, 11:16 AM

## 2016-01-10 NOTE — Discharge Summary (Signed)
Physician Discharge Summary  [place] DPO:[id number] DOB: August 22, 1966 DOA: 01/06/2016  PCP: Foye Spurling, MD  Admit date: 01/06/2016 Discharge date: 01/10/2016  Recommendations for Outpatient Follow-up:  1. Home health PT/OT 2. Resume hemodialysis on Saturday at usual HD center 3. PCP in 1-2 weeks for follow up of blood pressure and heart rate after starting low dose beta blocker.    Discharge Diagnoses:  Principal Problem:   Acute encephalopathy Active Problems:   ESRD (end stage renal disease) on dialysis (HCC)   Seizures (HCC)   History of ischemic left MCA stroke   Right sided weakness   Chronic combined systolic and diastolic CHF (congestive heart failure) (HCC)   Atrial fibrillation (HCC)   Leukocytosis   Benign essential HTN   Anemia of chronic disease   Controlled type 2 diabetes mellitus with diabetic nephropathy, without long-term current use of insulin (HCC)   History of DVT (deep vein thrombosis)   Discharge Condition: stable, improved  Diet recommendation: Renal/diabetic  Wt Readings from Last 3 Encounters:  01/08/16 94.4 kg (208 lb 1.8 oz)  01/01/16 93.26 kg (205 lb 9.6 oz)  01/01/16 92.443 kg (203 lb 12.8 oz)    History of present illness:  50 year old male with past medical history significant for end-stage renal disease on hemodialysis MWF, diabetes, hypertension, atrial fibrillation on anticoagulation with Coumadin, recent stroke with residual right-sided weakness. He presented to [facility] with seizure-like activity and worsening mental status per patient's wife at the bedside.  On admission, patient was hemodynamically stable. Blood work was notable for leukocytosis of 20.4, hemoglobin 9.2, potassium 2.9, creatinine 7.31, INR 2.53.   CT head showed no acute intracranial findings. MRI 4/30 with findings of diffusion only MRI without definitive acute infarct. Patient does have multiple late subacute to chronic infarcts bilaterally. Posterior  left frontal diffusion abnormality could potentially reflect sequela of recent seizure activity however superimposed acute ischemia cannot be excluded. MRI repeated 5/1 and it showed findings compatible with the expected evolution of subacute/early chronic left MCA territory infarcts. Pt does have few areas in brain that show remote hemorrhagic infarcts.  Hospital Course:   Acute metabolic encephalopathy likely due to prolonged post-ictal state and possible underlying infection superimposed on chronic left MCA CVA with residual right upper extremity weakness. Confusion mostly resolved by 01/09/2016.  EEG 01/07/2016 demonstrated severe background slowing indicative of bihemispheric dysfunction.  No definite ongoing seizure activity was noted.  MRI done 2 times, demonstrated chronic left MCA infarcts along with few ares of remote hemorrhagic infarcts, but no acute strokes.  He was started on lacosamide 100 mg twice daily at the direction of Neurology and had no further seizures.  He was advised to stop his tramadol.  He may not drive or operate heavy machinery for at least 6 months.      Active Problems:  End-stage renal disease on hemodialysis.  He was seen by Nephrology, but did not require dialysis during hospitalization.  They recommended that he follow up at his usual HD session on Saturday.     DM (diabetes mellitus) type II controlled with renal manifestation on current long-term insulin use ([place]om[place]).  Last A1c 12/03/2015 was 6.4, indicating good glycemic control.  Recommend reducing his lantus to 10 units until he is home and eating and drinking regularly.     Essential hypertension / hypotension, Continue Midodrin 2.5 mg twice daily.  Please be aware that he was started on low dose metroprolol for heart rate during this hospitalization which can  be discontinued if he develops worsening hypotension.     Chronic diastolic and systolic CHF, Last 2 D ECHO in 10/2015 showed EF 45% with grade 1 diastolic  dysfunction.  Compensated.    History of PE and DVT, On AC with coumadin, continue home regimen.    Hypokalemia, Due to end-stage renal disease, resolved with diet and some potassium supplementation.   Chronic atrial fibrillation with HR in the mid to high 110s, CHADS vasc score 5 (htn, chf, dm, stroke), on AC with coumadin.  Started on metoprolol 12.5 mg Q 12 hours.     Leukocytosis, started to trend down on vancomycin.  No urinary tract symptoms.  Lungs clear and his CXR was negative.  Blood cultures were negative.  His vancomycin was discontinued when cultures were negative for 48 hours.  No sepsis physiology.     Anemia of chronic renal disease, hemoglobin stable at 9.2, 9.1  Consultants:   Neurology  Nephrology  Procedures:   EEG 5/1 - This is a severely abnormal EEG due to the presence of Severe background slowing indicative of bihemispheric dysfunction which is a nonspecific finding seen in a variety of toxic, metabolic or hypoxic etiologies. No definite ongoing seizure activity is noted  Antimicrobials:   Vancomycin 5/1 > 5/3  Discharge Exam: Filed Vitals:   01/10/16 0532 01/10/16 1508  BP: 122/75 117/85  Pulse: 70 74  Temp: 97.6 F (36.4 C) 98 F (36.7 C)  Resp: 17 20   Filed Vitals:   01/10/16 0121 01/10/16 0532 01/10/16 0943 01/10/16 1508  BP: 102/73 122/75  117/85  Pulse: 63 70  74  Temp: 97.5 F (36.4 C) 97.6 F (36.4 C)  98 F (36.7 C)  TempSrc: Axillary Oral  Oral  Resp: 18 17  20   Height:      Weight:      SpO2: 99% 94% 96% 98%    General exam: Appears calm and comfortable, no distress  Respiratory system: Respiratory effort normal. Bilateral air entry, no wheezing  Cardiovascular system: S1 & S2 appreciated, Rate controlled. Gastrointestinal system: (+) BS, non tender abdomen, not distended  Central nervous system: Right upper extremity weakness otherwise nonfocal deficit Extremities: Patient able to lift lower extremities.  Right upper extremity weakness noted 4/5. 5/5 left upper extremity Skin: No rashes or ulcers Psychiatry: Oriented to person, place  Discharge Instructions      Discharge Instructions    Call MD for:  difficulty breathing, headache or visual disturbances    Complete by:  As directed      Call MD for:  extreme fatigue    Complete by:  As directed      Call MD for:  hives    Complete by:  As directed      Call MD for:  persistant dizziness or light-headedness    Complete by:  As directed      Call MD for:  persistant nausea and vomiting    Complete by:  As directed      Call MD for:  severe uncontrolled pain    Complete by:  As directed      Call MD for:  temperature >100.4    Complete by:  As directed      Diet Carb Modified    Complete by:  As directed      Discharge instructions    Complete by:  As directed   Home with home health services.  Please stop your tramadol as this medication can cause seizures.  Please take vimpat twice a day to reduce the chance of future seizures.  Reduce your lantus insulin to 10 units until you are eating and drinking more normally and your blood sugars are consistently greater than 200, then resume your normal dose.     Driving Restrictions    Complete by:  As directed   No driving or operating heavy machinery.  No swimming or bathing in a tub unsupervised.  No babysitting alone.     Increase activity slowly    Complete by:  As directed             Medication List    STOP taking these medications        cetirizine 10 MG tablet  Commonly known as:  ZYRTEC     traMADol 50 MG tablet  Commonly known as:  ULTRAM      TAKE these medications        aspirin EC 81 MG tablet  Take 1 tablet (81 mg total) by mouth daily.     baclofen 10 MG tablet  Commonly known as:  LIORESAL  Take 1 tablet (10 mg total) by mouth 3 (three) times daily. Start with 1/2 tablet three times daily x 2 weeks then one tablet three times daily     epoetin alfa  10000 UNIT/ML injection  Commonly known as:  EPOGEN,PROCRIT  Inject 5,000 Units into the skin once a week.     fluticasone furoate-vilanterol 100-25 MCG/INH Aepb  Commonly known as:  BREO ELLIPTA  Inhale 1 puff into the lungs daily.     Insulin Glargine 100 UNIT/ML Solostar Pen  Commonly known as:  LANTUS  Inject 10 Units into the skin daily at 10 pm.     Lacosamide 100 MG Tabs  Take 1 tablet (100 mg total) by mouth 2 (two) times daily.     metoprolol tartrate 25 MG tablet  Commonly known as:  LOPRESSOR  Take 0.5 tablets (12.5 mg total) by mouth 2 (two) times daily.     midodrine 2.5 MG tablet  Commonly known as:  PROAMATINE  Take 1 tablet (2.5 mg total) by mouth 2 (two) times daily with a meal.     multivitamin Tabs tablet  Take 1 tablet by mouth at bedtime.     NEEDLE (DISP) 27 G 27G X 1/2" Misc  Commonly known as:  BD DISP NEEDLES  1 application by Does not apply route daily after supper.     pantoprazole 40 MG tablet  Commonly known as:  PROTONIX  Take 1 tablet (40 mg total) by mouth 2 (two) times daily.     SENSIPAR 60 MG tablet  Generic drug:  cinacalcet  Take 60 mg by mouth daily.     warfarin 5 MG tablet  Commonly known as:  COUMADIN  Take 1.5 tablets (7.5 mg total) by mouth daily at 6 PM.       Follow-up Information    Follow up with Foye Spurling, MD. Schedule an appointment as soon as possible for a visit in 2 weeks.   Specialty:  Internal Medicine   Contact information:   [address] [place] Hartmann [place] [address] [phone]       Follow up with SETHI,PRAMOD, MD. Schedule an appointment as soon as possible for a visit in 1 month.   Specialties:  Neurology, Radiology   Contact information:   [address] [place] [address] [phone]        The results of  significant diagnostics from this hospitalization (including imaging, microbiology, ancillary and laboratory) are listed below for reference.    Significant  Diagnostic Studies: Ct Head Wo Contrast  01/06/2016  CLINICAL DATA:  Code stroke.  Right-sided weakness. EXAM: CT HEAD WITHOUT CONTRAST TECHNIQUE: Contiguous axial images were obtained from the base of the skull through the vertex without intravenous contrast. COMPARISON:  None FINDINGS: Prominence of the sulci and ventricles are identified compatible with brain atrophy. There is low attenuation within the subcortical and periventricular white matter compatible with chronic microvascular disease. Scattered small areas of encephalomalacia are identified within bilateral parietal lobes, image 22 of series 201 and image 21 of series 201 and the right occipital lobe, image 16 of series 201. No evidence for acute brain infarct, intracranial hemorrhage or mass. Retention cyst versus polyp is noted in the left maxillary sinus. The remaining paranasal sinuses and mastoid air cells are clear. The calvarium is intact. IMPRESSION: 1. No acute intracranial abnormalities. 2. Age advanced brain atrophy. 3. Chronic areas of encephalomalacia noted bilaterally. Electronically Signed   By: Kerby Moors M.D.   On: 01/06/2016 12:03   Mr Jodene Nam Head Wo Contrast  01/06/2016  CLINICAL DATA:  Code stroke. Right-sided weakness. Bilateral anterior and posterior circulation infarcts in 09/2015 related to deep venous thrombosis, pulmonary emboli, and paradoxical emboli. EXAM: MRI HEAD WITHOUT CONTRAST MRA HEAD WITHOUT CONTRAST TECHNIQUE: Multiplanar, multiecho pulse sequences of the brain and surrounding structures were obtained without intravenous contrast. Angiographic images of the head were obtained using MRA technique without contrast. COMPARISON:  Head CT 01/06/2016. Outside head CT, MRI, and MRA 09/30/2015 from [facility]. FINDINGS: MRI HEAD FINDINGS Only axial diffusion imaging was performed at the request of the stroke neurologist. There is an old right occipital lobe cortical infarct which was acute in January. There  are associated chronic blood products, and abnormal trace diffusion signal associated with this infarct is felt to be artifactual. There is also an old left MCA infarct involving the posterior frontal lobe which was acute in January. A small amount of chronic blood products are also suspected in this region. There is curvilinear trace diffusion signal along the margin of the infarct which is largely without evidence of any corresponding reduced ADC except for possibly a 5 mm focus superomedially. Old infarcts are also seen in the right frontal lobe (with chronic blood products) and both cerebellar hemispheres. There is no midline shift. MRA HEAD FINDINGS Study is mildly motion degraded. Visualized distal vertebral arteries are patent to the basilar with the right being dominant. PICA and AICA origins are not well evaluated. SCA origins are patent. Basilar artery is patent with slight artifact through its midportion at the slab interface but no evidence of significant stenosis. There is a patent left posterior communicating artery. The PCAs are patent without evidence of significant stenosis on the right. The left P1 segment is mildly hypoplastic based on the prior outside MRA, however there is suggestion of new severe tandem P1 stenosis on the current examination though this appearance may be accentuated due to motion artifact. Internal carotid arteries are patent from skullbase to carotid termini without significant stenosis. The right A1 segment is hypoplastic. The left A1 segment is patent without stenosis. A2 segments appear patent with assessment for stenosis limited by motion. M1 segments are patent bilaterally without stenosis. Right MCA bifurcation and major branch vessels appear patent. The left MCA bifurcation is patent, although there is likely moderate narrowing of the proximal M2 inferior division with  moderate to prominent attenuation of more distal branch vessels. The patency of the proximal most  aspect of this into vessel near the bifurcation appears improved compared to the prior outside MRA, although there may have been some artifactually diminished signal at this level on the prior study due to location at the slab interface. The more distal branch vessel attenuation appears greater on the current examination, although the current study has more motion artifact which limits assessment of the smaller vessels. IMPRESSION: 1. Diffusion only MRI without definite acute infarct identified. Multiple late subacute to chronic infarcts bilaterally as above with associated chronic blood products accounting for diffusion abnormality. Posterior left frontal diffusion abnormality could also potentially reflect the sequelae of recent seizure activity, though a trace amount of superimposed acute ischemia cannot be excluded. 2. Motion degraded MRA without emergent large vessel occlusion. 3. Proximal left M2 branch stenosis (with apparent improved patency compared to prior outside MRA) with progressive attenuation of more distal left MCA branch vessels. 4. New moderate to severe left P1 PCA stenoses, with assessment limited by motion artifact. Study was reviewed in person with Dr. Leonel Ramsay on 01/06/2016 at 12:30 p.m. Electronically Signed   By: Logan Bores M.D.   On: 01/06/2016 13:53   Mr Brain Wo Contrast  01/07/2016  CLINICAL DATA:  Progressive a aphasia. New right-sided facial droop. EXAM: MRI HEAD WITHOUT CONTRAST TECHNIQUE: Multiplanar, multiecho pulse sequences of the brain and surrounding structures were obtained without intravenous contrast. COMPARISON:  MRI brain and MRA head 01/06/2016. FINDINGS: There is no significant interval change in the extent at the diffusion abnormality on the trace sequence. The ADC map is unchanged. Remote blood products are associated with day remote right temporal occipital infarct. There are remote blood products along the posterior left frontal lobe infarct as well. No acute  hemorrhage is present. A remote right frontal lobe infarct is present with minimal blood products. Mild generalized atrophy is evident. Remote lacunar infarcts are present in the left cerebellar hemisphere without blood products. Flow is present in the major intracranial arteries. The globes and orbits are intact. A polyp or mucous retention cyst is again noted along the inferior aspect of the left maxillary sinus. The remaining paranasal sinuses and mastoid air cells are clear. The skullbase is within normal limits. The axial T1 weighted images are severely degraded by patient motion. Midline sagittal images are unremarkable. IMPRESSION: 1. Stable left MCA territory diffusion abnormality without evidence for acute or progressive infarct. This is compatible with the expected evolution of subacute/early chronic left MCA territory infarcts. 2. Remote hemorrhagic infarct of the posterior right temporal and occipital lobe. 3. Remote blood products associated with the posterior left frontal lobe infarct. 4. Remote hemorrhagic infarct in the more superior right frontal lobe. 5. Remote nonhemorrhagic lacunar infarcts of the left cerebellum. Electronically Signed   By: San Morelle M.D.   On: 01/07/2016 11:40   Dg Chest Port 1 View  01/06/2016  CLINICAL DATA:  Seizure EXAM: PORTABLE CHEST 1 VIEW COMPARISON:  10/16/2015 FINDINGS: Stable mild cardiac enlargement. Vascular pattern normal. Lungs clear. No effusions. Stable right central line. IMPRESSION: No active disease. Electronically Signed   By: Skipper Cliche M.D.   On: 01/06/2016 15:03   Mr Brain Ltd W/o Cm  01/06/2016  CLINICAL DATA:  Code stroke. Right-sided weakness. Bilateral anterior and posterior circulation infarcts in 09/2015 related to deep venous thrombosis, pulmonary emboli, and paradoxical emboli. EXAM: MRI HEAD WITHOUT CONTRAST MRA HEAD WITHOUT CONTRAST TECHNIQUE: Multiplanar, multiecho pulse  sequences of the brain and surrounding structures  were obtained without intravenous contrast. Angiographic images of the head were obtained using MRA technique without contrast. COMPARISON:  Head CT 01/06/2016. Outside head CT, MRI, and MRA 09/30/2015 from [facility]. FINDINGS: MRI HEAD FINDINGS Only axial diffusion imaging was performed at the request of the stroke neurologist. There is an old right occipital lobe cortical infarct which was acute in January. There are associated chronic blood products, and abnormal trace diffusion signal associated with this infarct is felt to be artifactual. There is also an old left MCA infarct involving the posterior frontal lobe which was acute in January. A small amount of chronic blood products are also suspected in this region. There is curvilinear trace diffusion signal along the margin of the infarct which is largely without evidence of any corresponding reduced ADC except for possibly a 5 mm focus superomedially. Old infarcts are also seen in the right frontal lobe (with chronic blood products) and both cerebellar hemispheres. There is no midline shift. MRA HEAD FINDINGS Study is mildly motion degraded. Visualized distal vertebral arteries are patent to the basilar with the right being dominant. PICA and AICA origins are not well evaluated. SCA origins are patent. Basilar artery is patent with slight artifact through its midportion at the slab interface but no evidence of significant stenosis. There is a patent left posterior communicating artery. The PCAs are patent without evidence of significant stenosis on the right. The left P1 segment is mildly hypoplastic based on the prior outside MRA, however there is suggestion of new severe tandem P1 stenosis on the current examination though this appearance may be accentuated due to motion artifact. Internal carotid arteries are patent from skullbase to carotid termini without significant stenosis. The right A1 segment is hypoplastic. The left A1 segment is patent  without stenosis. A2 segments appear patent with assessment for stenosis limited by motion. M1 segments are patent bilaterally without stenosis. Right MCA bifurcation and major branch vessels appear patent. The left MCA bifurcation is patent, although there is likely moderate narrowing of the proximal M2 inferior division with moderate to prominent attenuation of more distal branch vessels. The patency of the proximal most aspect of this into vessel near the bifurcation appears improved compared to the prior outside MRA, although there may have been some artifactually diminished signal at this level on the prior study due to location at the slab interface. The more distal branch vessel attenuation appears greater on the current examination, although the current study has more motion artifact which limits assessment of the smaller vessels. IMPRESSION: 1. Diffusion only MRI without definite acute infarct identified. Multiple late subacute to chronic infarcts bilaterally as above with associated chronic blood products accounting for diffusion abnormality. Posterior left frontal diffusion abnormality could also potentially reflect the sequelae of recent seizure activity, though a trace amount of superimposed acute ischemia cannot be excluded. 2. Motion degraded MRA without emergent large vessel occlusion. 3. Proximal left M2 branch stenosis (with apparent improved patency compared to prior outside MRA) with progressive attenuation of more distal left MCA branch vessels. 4. New moderate to severe left P1 PCA stenoses, with assessment limited by motion artifact. Study was reviewed in person with Dr. Leonel Ramsay on 01/06/2016 at 12:30 p.m. Electronically Signed   By: Logan Bores M.D.   On: 01/06/2016 13:53    Microbiology: Recent Results (from the past 240 hour(s))  TECHNOLOGIST REVIEW     Status: None   Collection Time: 01/01/16 10:27 AM  Result Value Ref Range Status   Technologist Review few teardrops, ovalos,  targets. few large plts.  Final  Culture, blood (routine x 2)     Status: None (Preliminary result)   Collection Time: 01/07/16  8:18 PM  Result Value Ref Range Status   Specimen Description BLOOD LEFT FOREARM  Final   Special Requests BOTTLES DRAWN AEROBIC AND ANAEROBIC 10CC  Final   Culture NO GROWTH 3 DAYS  Final   Report Status PENDING  Incomplete  Culture, blood (routine x 2)     Status: None (Preliminary result)   Collection Time: 01/07/16  8:24 PM  Result Value Ref Range Status   Specimen Description BLOOD LEFT HAND  Final   Special Requests BOTTLES DRAWN AEROBIC AND ANAEROBIC 10CC  Final   Culture NO GROWTH 3 DAYS  Final   Report Status PENDING  Incomplete     Labs: Basic Metabolic Panel:  Recent Labs Lab 01/06/16 1144 01/06/16 1153 01/07/16 1658 01/10/16 0651  NA 138 139 136 138  K 2.9* 2.9* 3.3* 4.1  CL 102 101 101 101  CO2 15*  --  22 26  GLUCOSE 192* 181* 109* 99  BUN 35* 36* 41* 26*  CREATININE 7.31* 7.30* 7.34* 5.62*  CALCIUM 7.4*  --  7.0* 7.2*  PHOS  --   --  5.1* 5.3*   Liver Function Tests:  Recent Labs Lab 01/06/16 1144 01/07/16 1658 01/10/16 0651  AST 30  --   --   ALT 14*  --   --   ALKPHOS 113  --   --   BILITOT 1.2  --   --   PROT 8.0  --   --   ALBUMIN 3.3* 3.3* 3.0*   No results for input(s): LIPASE, AMYLASE in the last 168 hours. No results for input(s): AMMONIA in the last 168 hours. CBC:  Recent Labs Lab 01/06/16 1144 01/06/16 1153 01/07/16 0500 01/08/16 0517 01/09/16 0636 01/10/16 0708  WBC 20.4*  --  18.3* 14.8* 16.1* 15.7*  NEUTROABS 13.3*  --   --   --   --   --   HGB 9.2* 11.2* 9.2* 9.1* 10.4* 9.9*  HCT 25.3* 33.0* 25.1* 25.6* 29.6* 27.8*  MCV 80.1  --  79.4 78.5 80.7 78.3  PLT 420*  --  539* 513* 487* 465*   Cardiac Enzymes: No results for input(s): CKTOTAL, CKMB, CKMBINDEX, TROPONINI in the last 168 hours. BNP: BNP (last 3 results) No results for input(s): BNP in the last 8760 hours.  ProBNP (last 3  results) No results for input(s): PROBNP in the last 8760 hours.  CBG:  Recent Labs Lab 01/09/16 1156 01/09/16 1656 01/09/16 2113 01/10/16 0618 01/10/16 1141  GLUCAP 200* 135* 175* 99 229*    Time coordinating discharge: 35 minutes  Signed:  Teosha Casso  [facility] 01/10/2016, 3:25 PM

## 2016-01-11 ENCOUNTER — Encounter: Payer: Self-pay | Admitting: Vascular Surgery

## 2016-01-11 ENCOUNTER — Ambulatory Visit: Payer: Self-pay

## 2016-01-11 ENCOUNTER — Encounter: Payer: Self-pay | Admitting: Occupational Therapy

## 2016-01-11 LAB — CBC
HCT: 26.4 % — ABNORMAL LOW (ref 39.0–52.0)
Hemoglobin: 9.5 g/dL — ABNORMAL LOW (ref 13.0–17.0)
MCH: 28 pg (ref 26.0–34.0)
MCHC: 36 g/dL (ref 30.0–36.0)
MCV: 77.9 fL — ABNORMAL LOW (ref 78.0–100.0)
Platelets: 477 10*3/uL — ABNORMAL HIGH (ref 150–400)
RBC: 3.39 MIL/uL — ABNORMAL LOW (ref 4.22–5.81)
RDW: 16.1 % — ABNORMAL HIGH (ref 11.5–15.5)
WBC: 14.8 10*3/uL — ABNORMAL HIGH (ref 4.0–10.5)

## 2016-01-11 LAB — PROTIME-INR
INR: 2.21 — ABNORMAL HIGH (ref 0.00–1.49)
Prothrombin Time: 24.3 seconds — ABNORMAL HIGH (ref 11.6–15.2)

## 2016-01-11 LAB — RENAL FUNCTION PANEL
Albumin: 3 g/dL — ABNORMAL LOW (ref 3.5–5.0)
Anion gap: 15 (ref 5–15)
BUN: 36 mg/dL — ABNORMAL HIGH (ref 6–20)
CO2: 25 mmol/L (ref 22–32)
Calcium: 7.1 mg/dL — ABNORMAL LOW (ref 8.9–10.3)
Chloride: 101 mmol/L (ref 101–111)
Creatinine, Ser: 6.15 mg/dL — ABNORMAL HIGH (ref 0.61–1.24)
GFR calc Af Amer: 11 mL/min — ABNORMAL LOW (ref >60)
GFR calc non Af Amer: 9 mL/min — ABNORMAL LOW (ref >60)
Glucose, Bld: 99 mg/dL (ref 65–99)
Phosphorus: 5.9 mg/dL — ABNORMAL HIGH (ref 2.5–4.6)
Potassium: 4.5 mmol/L (ref 3.5–5.1)
Sodium: 141 mmol/L (ref 135–145)

## 2016-01-11 LAB — GLUCOSE, CAPILLARY
Glucose-Capillary: 96 mg/dL (ref 65–99)
Glucose-Capillary: 153 mg/dL — ABNORMAL HIGH (ref 65–99)

## 2016-01-11 MED ORDER — HEPARIN SODIUM (PORCINE) 1000 UNIT/ML DIALYSIS: 1000.0000 [IU] | INTRAMUSCULAR | Status: DC | PRN

## 2016-01-11 MED ORDER — LACOSAMIDE 100 MG PO TABS: 100.0000 mg | ORAL_TABLET | Freq: Two times a day (BID) | ORAL | Status: DC

## 2016-01-11 MED ORDER — LIDOCAINE-PRILOCAINE 2.5-2.5 % EX CREA: 1.0000 "application " | TOPICAL_CREAM | CUTANEOUS | Status: DC | PRN

## 2016-01-11 MED ORDER — SODIUM CHLORIDE 0.9 % IV SOLN: 100.0000 mL | INTRAVENOUS | Status: DC | PRN

## 2016-01-11 MED ORDER — PENTAFLUOROPROP-TETRAFLUOROETH EX AERO: 1.0000 "application " | INHALATION_SPRAY | CUTANEOUS | Status: DC | PRN

## 2016-01-11 MED ORDER — LIDOCAINE HCL (PF) 1 % IJ SOLN: 5.0000 mL | INTRAMUSCULAR | Status: DC | PRN

## 2016-01-11 MED ORDER — ALTEPLASE 2 MG IJ SOLR: 2.0000 mg | Freq: Once | INTRAMUSCULAR | Status: DC | PRN

## 2016-01-11 MED FILL — VIMPAT 100 MG TABLET: 100 | 30 days supply | Qty: 60 | Fill #0 | Status: TO

## 2016-01-11 NOTE — Discharge Summary (Signed)
Physician Discharge Summary  [place] WUG:[id number] DOB: Jun 10, 1966 DOA: 01/06/2016  PCP: Foye Spurling, MD  Admit date: 01/06/2016 Discharge date: 01/11/2016  Recommendations for Outpatient Follow-up:  1. Home health PT/OT 2. Resume hemodialysis on Saturday at usual HD center 3. PCP in 1-2 weeks for follow up of blood pressure and heart rate after starting low dose beta blocker.   4. Vascular surgery, Dr. Oneida Alar, 01/17/2016 to evaluate fistula 5. Dr. Leonie Man at already scheduled follow up appointment or sooner if needed 6. ESRD, return to regular dialysis center and schedule  Discharge Diagnoses:  Principal Problem:   Seizures ([place]) Active Problems:   ESRD (end stage renal disease) on dialysis ([place])   Acute encephalopathy   History of ischemic left MCA stroke   Right sided weakness   Chronic combined systolic and diastolic CHF (congestive heart failure) (HCC)   Atrial fibrillation (HCC)   Leukocytosis   Benign essential HTN   Anemia of chronic disease   Controlled type 2 diabetes mellitus with diabetic nephropathy, without long-term current use of insulin (HCC)   History of DVT (deep vein thrombosis)   Discharge Condition: stable, improved  Diet recommendation: Renal/diabetic  Wt Readings from Last 3 Encounters:  01/08/16 94.4 kg (208 lb 1.8 oz)  01/01/16 93.26 kg (205 lb 9.6 oz)  01/01/16 92.443 kg (203 lb 12.8 oz)    History of present illness:  50 year old male with past medical history significant for end-stage renal disease on hemodialysis MWF, diabetes, hypertension, atrial fibrillation on anticoagulation with Coumadin, recent stroke with residual right-sided weakness. He presented to [facility] with seizure-like activity and worsening mental status per patient's wife at the bedside.  On admission, patient was hemodynamically stable. Blood work was notable for leukocytosis of 20.4, hemoglobin 9.2, potassium 2.9, creatinine 7.31, INR 2.53.   CT head  showed no acute intracranial findings. MRI 4/30 with findings of diffusion only MRI without definitive acute infarct. Patient does have multiple late subacute to chronic infarcts bilaterally. Posterior left frontal diffusion abnormality could potentially reflect sequela of recent seizure activity however superimposed acute ischemia cannot be excluded. MRI repeated 5/1 and it showed findings compatible with the expected evolution of subacute/early chronic left MCA territory infarcts. Pt does have few areas in brain that show remote hemorrhagic infarcts.  Hospital Course:   New onset seizures.  MRI demonstrated no new strokes.  He was started on vimpat and remained seizure free.    Acute metabolic encephalopathy likely due to prolonged post-ictal state and possible underlying infection superimposed on chronic left MCA CVA with residual right upper extremity weakness. Confusion mostly resolved by 01/09/2016.  EEG 01/07/2016 demonstrated severe background slowing indicative of bihemispheric dysfunction.  No definite ongoing seizure activity was noted.  MRI done 2 times, demonstrated chronic left MCA infarcts along with few ares of remote hemorrhagic infarcts, but no acute strokes.  He was started on lacosamide 100 mg twice daily at the direction of Neurology and had no further seizures.  He was advised to stop his tramadol.  He may not drive or operate heavy machinery for at least 6 months.       End-stage renal disease on hemodialysis.  He was seen by Nephrology, but did not require dialysis during hospitalization.  They recommended that he follow up at his usual HD session on Saturday.     DM (diabetes mellitus) type II controlled with renal manifestation on current long-term insulin use ([place]).  Last A1c 12/03/2015 was 6.4, indicating good glycemic control.  Recommend reducing his lantus to 10 units until he is home and eating and drinking regularly.     Essential hypertension / hypotension, Continue  Midodrin 2.5 mg twice daily.  Please be aware that he was started on low dose metroprolol for heart rate during this hospitalization which can be discontinued if he develops worsening hypotension.     Chronic diastolic and systolic CHF, Last 2 D ECHO in 10/2015 showed EF 45% with grade 1 diastolic dysfunction.  Compensated.    History of PE and DVT, On AC with coumadin, continue home regimen.    Hypokalemia, Due to end-stage renal disease, resolved with diet and some potassium supplementation.   Chronic atrial fibrillation with HR in the mid to high 110s, CHADS vasc score 5 (htn, chf, dm, stroke), on AC with coumadin.  Started on metoprolol 12.5 mg Q 12 hours.     Leukocytosis, started to trend down on vancomycin.  No urinary tract symptoms.  Lungs clear and his CXR was negative.  Blood cultures were negative.  His vancomycin was discontinued when cultures were negative for 48 hours.  No sepsis physiology.     Anemia of chronic renal disease, hemoglobin stable at 9.2, 9.1  Consultants:   Neurology  Nephrology  Procedures:   EEG 5/1 - This is a severely abnormal EEG due to the presence of Severe background slowing indicative of bihemispheric dysfunction which is a nonspecific finding seen in a variety of toxic, metabolic or hypoxic etiologies. No definite ongoing seizure activity is noted  Antimicrobials:   Vancomycin 5/1 > 5/3  Discharge Exam: Filed Vitals:   01/11/16 0124 01/11/16 0534  BP: 104/64 115/79  Pulse: 71 79  Temp: 98 F (36.7 C) 97.3 F (36.3 C)  Resp: 20 20   Filed Vitals:   01/10/16 1837 01/10/16 2110 01/11/16 0124 01/11/16 0534  BP: 120/82 120/74 104/64 115/79  Pulse: 78 69 71 79  Temp: 98.6 F (37 C) 98.1 F (36.7 C) 98 F (36.7 C) 97.3 F (36.3 C)  TempSrc: Oral Oral Oral Oral  Resp: 20 20 20 20   Height:      Weight:      SpO2: 99% 100% 94% 100%    General exam: Appears calm and comfortable, no distress Respiratory system:  Respiratory effort normal. Bilateral air entry, no wheezing  Cardiovascular system: S1 & S2 appreciated, Rate controlled. Gastrointestinal system: (+) BS, non tender abdomen, not distended  Central nervous system: Right upper extremity weakness otherwise nonfocal deficit Extremities: Patient able to lift lower extremities. Right upper extremity weakness noted 4/5. 5/5 left upper extremity  Discharge Instructions      Discharge Instructions    Call MD for:  difficulty breathing, headache or visual disturbances    Complete by:  As directed      Call MD for:  extreme fatigue    Complete by:  As directed      Call MD for:  hives    Complete by:  As directed      Call MD for:  persistant dizziness or light-headedness    Complete by:  As directed      Call MD for:  persistant nausea and vomiting    Complete by:  As directed      Call MD for:  severe uncontrolled pain    Complete by:  As directed      Call MD for:  temperature >100.4    Complete by:  As directed      Diet Carb  Modified    Complete by:  As directed      Discharge instructions    Complete by:  As directed   Home with home health services.  Please stop your tramadol as this medication can cause seizures.  Please take vimpat twice a day to reduce the chance of future seizures.  Reduce your lantus insulin to 10 units until you are eating and drinking more normally and your blood sugars are consistently greater than 200, then resume your normal dose.     Driving Restrictions    Complete by:  As directed   No driving or operating heavy machinery.  No swimming or bathing in a tub unsupervised.  No babysitting alone.     Increase activity slowly    Complete by:  As directed             Medication List    STOP taking these medications        baclofen 10 MG tablet  Commonly known as:  LIORESAL     cetirizine 10 MG tablet  Commonly known as:  ZYRTEC     traMADol 50 MG tablet  Commonly known as:  ULTRAM      TAKE  these medications        aspirin EC 81 MG tablet  Take 1 tablet (81 mg total) by mouth daily.     epoetin alfa 10000 UNIT/ML injection  Commonly known as:  EPOGEN,PROCRIT  Inject 5,000 Units into the skin once a week.     fluticasone furoate-vilanterol 100-25 MCG/INH Aepb  Commonly known as:  BREO ELLIPTA  Inhale 1 puff into the lungs daily.     Insulin Glargine 100 UNIT/ML Solostar Pen  Commonly known as:  LANTUS  Inject 10 Units into the skin daily at 10 pm.     Lacosamide 100 MG Tabs  Take 1 tablet (100 mg total) by mouth 2 (two) times daily.     Lacosamide 100 MG Tabs  Commonly known as:  VIMPAT  Take 1 tablet (100 mg total) by mouth 2 (two) times daily.     metoprolol tartrate 25 MG tablet  Commonly known as:  LOPRESSOR  Take 0.5 tablets (12.5 mg total) by mouth 2 (two) times daily.     midodrine 2.5 MG tablet  Commonly known as:  PROAMATINE  Take 1 tablet (2.5 mg total) by mouth 2 (two) times daily with a meal.     multivitamin Tabs tablet  Take 1 tablet by mouth at bedtime.     NEEDLE (DISP) 27 G 27G X 1/2" Misc  Commonly known as:  BD DISP NEEDLES  1 application by Does not apply route daily after supper.     pantoprazole 40 MG tablet  Commonly known as:  PROTONIX  Take 1 tablet (40 mg total) by mouth 2 (two) times daily.     SENSIPAR 60 MG tablet  Generic drug:  cinacalcet  Take 60 mg by mouth daily.     warfarin 5 MG tablet  Commonly known as:  COUMADIN  Take 1.5 tablets (7.5 mg total) by mouth daily at 6 PM.       Follow-up Information    Follow up with Foye Spurling, MD. Schedule an appointment as soon as possible for a visit in 2 weeks.   Specialty:  Internal Medicine   Contact information:   [address] Kris Hartmann [place] [address] [phone]       Follow up with SETHI,PRAMOD, MD. Schedule an appointment  as soon as possible for a visit in 1 month.   Specialties:  Neurology, Radiology   Contact information:   [address] [place] [address] [phone]        The results of significant diagnostics from this hospitalization (including imaging, microbiology, ancillary and laboratory) are listed below for reference.    Significant Diagnostic Studies: Ct Head Wo Contrast  01/06/2016  CLINICAL DATA:  Code stroke.  Right-sided weakness. EXAM: CT HEAD WITHOUT CONTRAST TECHNIQUE: Contiguous axial images were obtained from the base of the skull through the vertex without intravenous contrast. COMPARISON:  None FINDINGS: Prominence of the sulci and ventricles are identified compatible with brain atrophy. There is low attenuation within the subcortical and periventricular white matter compatible with chronic microvascular disease. Scattered small areas of encephalomalacia are identified within bilateral parietal lobes, image 22 of series 201 and image 21 of series 201 and the right occipital lobe, image 16 of series 201. No evidence for acute brain infarct, intracranial hemorrhage or mass. Retention cyst versus polyp is noted in the left maxillary sinus. The remaining paranasal sinuses and mastoid air cells are clear. The calvarium is intact. IMPRESSION: 1. No acute intracranial abnormalities. 2. Age advanced brain atrophy. 3. Chronic areas of encephalomalacia noted bilaterally. Electronically Signed   By: Kerby Moors M.D.   On: 01/06/2016 12:03   Mr Jodene Nam Head Wo Contrast  01/06/2016  CLINICAL DATA:  Code stroke. Right-sided weakness. Bilateral anterior and posterior circulation infarcts in 09/2015 related to deep venous thrombosis, pulmonary emboli, and paradoxical emboli. EXAM: MRI HEAD WITHOUT CONTRAST MRA HEAD WITHOUT CONTRAST TECHNIQUE: Multiplanar, multiecho pulse sequences of the brain and surrounding structures were obtained without intravenous contrast. Angiographic images of the head were obtained using MRA technique without contrast. COMPARISON:  Head CT 01/06/2016. Outside head CT, MRI, and MRA  09/30/2015 from [facility]. FINDINGS: MRI HEAD FINDINGS Only axial diffusion imaging was performed at the request of the stroke neurologist. There is an old right occipital lobe cortical infarct which was acute in January. There are associated chronic blood products, and abnormal trace diffusion signal associated with this infarct is felt to be artifactual. There is also an old left MCA infarct involving the posterior frontal lobe which was acute in January. A small amount of chronic blood products are also suspected in this region. There is curvilinear trace diffusion signal along the margin of the infarct which is largely without evidence of any corresponding reduced ADC except for possibly a 5 mm focus superomedially. Old infarcts are also seen in the right frontal lobe (with chronic blood products) and both cerebellar hemispheres. There is no midline shift. MRA HEAD FINDINGS Study is mildly motion degraded. Visualized distal vertebral arteries are patent to the basilar with the right being dominant. PICA and AICA origins are not well evaluated. SCA origins are patent. Basilar artery is patent with slight artifact through its midportion at the slab interface but no evidence of significant stenosis. There is a patent left posterior communicating artery. The PCAs are patent without evidence of significant stenosis on the right. The left P1 segment is mildly hypoplastic based on the prior outside MRA, however there is suggestion of new severe tandem P1 stenosis on the current examination though this appearance may be accentuated due to motion artifact. Internal carotid arteries are patent from skullbase to carotid termini without significant stenosis. The right A1 segment is hypoplastic. The left A1 segment is patent without stenosis. A2 segments appear patent with  assessment for stenosis limited by motion. M1 segments are patent bilaterally without stenosis. Right MCA bifurcation and major branch vessels  appear patent. The left MCA bifurcation is patent, although there is likely moderate narrowing of the proximal M2 inferior division with moderate to prominent attenuation of more distal branch vessels. The patency of the proximal most aspect of this into vessel near the bifurcation appears improved compared to the prior outside MRA, although there may have been some artifactually diminished signal at this level on the prior study due to location at the slab interface. The more distal branch vessel attenuation appears greater on the current examination, although the current study has more motion artifact which limits assessment of the smaller vessels. IMPRESSION: 1. Diffusion only MRI without definite acute infarct identified. Multiple late subacute to chronic infarcts bilaterally as above with associated chronic blood products accounting for diffusion abnormality. Posterior left frontal diffusion abnormality could also potentially reflect the sequelae of recent seizure activity, though a trace amount of superimposed acute ischemia cannot be excluded. 2. Motion degraded MRA without emergent large vessel occlusion. 3. Proximal left M2 branch stenosis (with apparent improved patency compared to prior outside MRA) with progressive attenuation of more distal left MCA branch vessels. 4. New moderate to severe left P1 PCA stenoses, with assessment limited by motion artifact. Study was reviewed in person with Dr. Leonel Ramsay on 01/06/2016 at 12:30 p.m. Electronically Signed   By: Logan Bores M.D.   On: 01/06/2016 13:53   Mr Brain Wo Contrast  01/07/2016  CLINICAL DATA:  Progressive a aphasia. New right-sided facial droop. EXAM: MRI HEAD WITHOUT CONTRAST TECHNIQUE: Multiplanar, multiecho pulse sequences of the brain and surrounding structures were obtained without intravenous contrast. COMPARISON:  MRI brain and MRA head 01/06/2016. FINDINGS: There is no significant interval change in the extent at the diffusion  abnormality on the trace sequence. The ADC map is unchanged. Remote blood products are associated with day remote right temporal occipital infarct. There are remote blood products along the posterior left frontal lobe infarct as well. No acute hemorrhage is present. A remote right frontal lobe infarct is present with minimal blood products. Mild generalized atrophy is evident. Remote lacunar infarcts are present in the left cerebellar hemisphere without blood products. Flow is present in the major intracranial arteries. The globes and orbits are intact. A polyp or mucous retention cyst is again noted along the inferior aspect of the left maxillary sinus. The remaining paranasal sinuses and mastoid air cells are clear. The skullbase is within normal limits. The axial T1 weighted images are severely degraded by patient motion. Midline sagittal images are unremarkable. IMPRESSION: 1. Stable left MCA territory diffusion abnormality without evidence for acute or progressive infarct. This is compatible with the expected evolution of subacute/early chronic left MCA territory infarcts. 2. Remote hemorrhagic infarct of the posterior right temporal and occipital lobe. 3. Remote blood products associated with the posterior left frontal lobe infarct. 4. Remote hemorrhagic infarct in the more superior right frontal lobe. 5. Remote nonhemorrhagic lacunar infarcts of the left cerebellum. Electronically Signed   By: San Morelle M.D.   On: 01/07/2016 11:40   Dg Chest Port 1 View  01/06/2016  CLINICAL DATA:  Seizure EXAM: PORTABLE CHEST 1 VIEW COMPARISON:  10/16/2015 FINDINGS: Stable mild cardiac enlargement. Vascular pattern normal. Lungs clear. No effusions. Stable right central line. IMPRESSION: No active disease. Electronically Signed   By: Skipper Cliche M.D.   On: 01/06/2016 15:03   Mr Brain Ltd W/o Cm  01/06/2016  CLINICAL DATA:  Code stroke. Right-sided weakness. Bilateral anterior and posterior circulation  infarcts in 09/2015 related to deep venous thrombosis, pulmonary emboli, and paradoxical emboli. EXAM: MRI HEAD WITHOUT CONTRAST MRA HEAD WITHOUT CONTRAST TECHNIQUE: Multiplanar, multiecho pulse sequences of the brain and surrounding structures were obtained without intravenous contrast. Angiographic images of the head were obtained using MRA technique without contrast. COMPARISON:  Head CT 01/06/2016. Outside head CT, MRI, and MRA 09/30/2015 from [facility]. FINDINGS: MRI HEAD FINDINGS Only axial diffusion imaging was performed at the request of the stroke neurologist. There is an old right occipital lobe cortical infarct which was acute in January. There are associated chronic blood products, and abnormal trace diffusion signal associated with this infarct is felt to be artifactual. There is also an old left MCA infarct involving the posterior frontal lobe which was acute in January. A small amount of chronic blood products are also suspected in this region. There is curvilinear trace diffusion signal along the margin of the infarct which is largely without evidence of any corresponding reduced ADC except for possibly a 5 mm focus superomedially. Old infarcts are also seen in the right frontal lobe (with chronic blood products) and both cerebellar hemispheres. There is no midline shift. MRA HEAD FINDINGS Study is mildly motion degraded. Visualized distal vertebral arteries are patent to the basilar with the right being dominant. PICA and AICA origins are not well evaluated. SCA origins are patent. Basilar artery is patent with slight artifact through its midportion at the slab interface but no evidence of significant stenosis. There is a patent left posterior communicating artery. The PCAs are patent without evidence of significant stenosis on the right. The left P1 segment is mildly hypoplastic based on the prior outside MRA, however there is suggestion of new severe tandem P1 stenosis on the current  examination though this appearance may be accentuated due to motion artifact. Internal carotid arteries are patent from skullbase to carotid termini without significant stenosis. The right A1 segment is hypoplastic. The left A1 segment is patent without stenosis. A2 segments appear patent with assessment for stenosis limited by motion. M1 segments are patent bilaterally without stenosis. Right MCA bifurcation and major branch vessels appear patent. The left MCA bifurcation is patent, although there is likely moderate narrowing of the proximal M2 inferior division with moderate to prominent attenuation of more distal branch vessels. The patency of the proximal most aspect of this into vessel near the bifurcation appears improved compared to the prior outside MRA, although there may have been some artifactually diminished signal at this level on the prior study due to location at the slab interface. The more distal branch vessel attenuation appears greater on the current examination, although the current study has more motion artifact which limits assessment of the smaller vessels. IMPRESSION: 1. Diffusion only MRI without definite acute infarct identified. Multiple late subacute to chronic infarcts bilaterally as above with associated chronic blood products accounting for diffusion abnormality. Posterior left frontal diffusion abnormality could also potentially reflect the sequelae of recent seizure activity, though a trace amount of superimposed acute ischemia cannot be excluded. 2. Motion degraded MRA without emergent large vessel occlusion. 3. Proximal left M2 branch stenosis (with apparent improved patency compared to prior outside MRA) with progressive attenuation of more distal left MCA branch vessels. 4. New moderate to severe left P1 PCA stenoses, with assessment limited by motion artifact. Study was reviewed in person with Dr. Leonel Ramsay on 01/06/2016 at 12:30 p.m. Electronically Signed  By: Logan Bores  M.D.   On: 01/06/2016 13:53    Microbiology: Recent Results (from the past 240 hour(s))  Culture, blood (routine x 2)     Status: None (Preliminary result)   Collection Time: 01/07/16  8:18 PM  Result Value Ref Range Status   Specimen Description BLOOD LEFT FOREARM  Final   Special Requests BOTTLES DRAWN AEROBIC AND ANAEROBIC 10CC  Final   Culture NO GROWTH 3 DAYS  Final   Report Status PENDING  Incomplete  Culture, blood (routine x 2)     Status: None (Preliminary result)   Collection Time: 01/07/16  8:24 PM  Result Value Ref Range Status   Specimen Description BLOOD LEFT HAND  Final   Special Requests BOTTLES DRAWN AEROBIC AND ANAEROBIC 10CC  Final   Culture NO GROWTH 3 DAYS  Final   Report Status PENDING  Incomplete     Labs: Basic Metabolic Panel:  Recent Labs Lab 01/06/16 1144 01/06/16 1153 01/07/16 1658 01/10/16 0651 01/11/16 0400  NA 138 139 136 138 141  K 2.9* 2.9* 3.3* 4.1 4.5  CL 102 101 101 101 101  CO2 15*  --  22 26 25   GLUCOSE 192* 181* 109* 99 99  BUN 35* 36* 41* 26* 36*  CREATININE 7.31* 7.30* 7.34* 5.62* 6.15*  CALCIUM 7.4*  --  7.0* 7.2* 7.1*  PHOS  --   --  5.1* 5.3* 5.9*   Liver Function Tests:  Recent Labs Lab 01/06/16 1144 01/07/16 1658 01/10/16 0651 01/11/16 0400  AST 30  --   --   --   ALT 14*  --   --   --   ALKPHOS 113  --   --   --   BILITOT 1.2  --   --   --   PROT 8.0  --   --   --   ALBUMIN 3.3* 3.3* 3.0* 3.0*   No results for input(s): LIPASE, AMYLASE in the last 168 hours. No results for input(s): AMMONIA in the last 168 hours. CBC:  Recent Labs Lab 01/06/16 1144  01/07/16 0500 01/08/16 0517 01/09/16 0636 01/10/16 0708 01/11/16 0343  WBC 20.4*  --  18.3* 14.8* 16.1* 15.7* 14.8*  NEUTROABS 13.3*  --   --   --   --   --   --   HGB 9.2*  < > 9.2* 9.1* 10.4* 9.9* 9.5*  HCT 25.3*  < > 25.1* 25.6* 29.6* 27.8* 26.4*  MCV 80.1  --  79.4 78.5 80.7 78.3 77.9*  PLT 420*  --  539* 513* 487* 465* 477*  < > = values in this  interval not displayed. Cardiac Enzymes: No results for input(s): CKTOTAL, CKMB, CKMBINDEX, TROPONINI in the last 168 hours. BNP: BNP (last 3 results) No results for input(s): BNP in the last 8760 hours.  ProBNP (last 3 results) No results for input(s): PROBNP in the last 8760 hours.  CBG:  Recent Labs Lab 01/10/16 1141 01/10/16 1618 01/10/16 2206 01/11/16 0625 01/11/16 1143  GLUCAP 229* 131* 123* 96 153*    Time coordinating discharge: 35 minutes  Signed:  Kyrese Gartman  [facility] 01/11/2016, 12:25 PM

## 2016-01-11 NOTE — Progress Notes (Signed)
   Vascular and Vein Specialist of [facility]  Patient name: Isaiah [name] [facility] MRN: [id number] DOB: Sep 16, 1965 Sex: male  REASON FOR VISIT: pain in right hand distal right AVF  HPI: Isaiah [name] [facility] is a 50 y.o. male who is s/p right radial cephalic AV fistula creation on 12/05/15 by Dr. Oneida Alar. We were asked to evaluate his right hand pain. He states that his hand has been painful since surgery. The patient suffered a CVA affecting his right side earlier this year. He was admitted on 01/06/16 after experiencing seizure like activity. The patient is currently stable and awaiting discharge.   PHYSICAL EXAM: Filed Vitals:   01/10/16 1837 01/10/16 2110 01/11/16 0124 01/11/16 0534  BP: 120/82 120/74 104/64 115/79  Pulse: 78 69 71 79  Temp: 98.6 F (37 C) 98.1 F (36.7 C) 98 F (36.7 C) 97.3 F (36.3 C)  TempSrc: Oral Oral Oral Oral  Resp: 20 20 20 20   Height:      Weight:      SpO2: 99% 100% 94% 100%   Right forearm fistula with palpable thrill. Good right radial and palmar arch doppler flow. Palmar arch flow does not alter with compression of fistula. No digital artery doppler flow. This is likely secondary to spasm from using of ice pack on patient's hand.   Right hand mildly swollen.  Right wrist incision healed.   MEDICAL ISSUES: S/p right radial cephalic fistula 2/70/78  The fistula is patent. No evidence of steal syndrome right hand. Patient has had this pain since his surgery. Encouraged the patient to elevate his arm to minimize swelling. The patient has an appointment with Dr. Oneida Alar on 01/17/16 to evaluate his fistula. [place] for discharge.   Virgina Jock, PA-C Vascular and Vein Specialists of [place]

## 2016-01-11 NOTE — Progress Notes (Addendum)
RN added to [place] orders. CM informed Tiffany with [facility]. Plan is for patient to discharge home today after HD. CM provided the patients mother with the 14 day free card and co pay card for Vimpat. Will update the bedside RN.

## 2016-01-11 NOTE — Progress Notes (Signed)
Occupational Therapy Treatment Patient Details Name: Isaiah [name] [facility] MRN: [id number] DOB: [birth year] Today's Date: 01/11/2016    History of present illness 50 y.o. male admitted to [facility] on 01/06/16 for seizure and acute metabolic encepholopathy.  Pt with significant PMHx of DM, sickle cell-thalassemia disease, ESRD on HD MWF, gout, HTN, and A-fib.   OT comments  Pt making progress toward OT goals. Tolerating R hand exercises and edema management. Pt able to complete bed mobility with min guard assist today and functional mobility with use of cane and min guard for safety. Pt now planning d/c home; upgraded follow up recommendations to [place]. Will continue to follow acutely.   Follow Up Recommendations  Home health OT;Supervision/Assistance - 24 hour    Equipment Recommendations  None recommended by OT    Recommendations for Other Services      Precautions / Restrictions Precautions Precautions: Fall Precaution Comments: due to right hemi Restrictions Weight Bearing Restrictions: No       Mobility Bed Mobility Overal bed mobility: Needs Assistance Bed Mobility: Supine to Sit     Supine to sit: Min guard;HOB elevated     General bed mobility comments: Use of bed rail to push up with HOB slightly elevated. No physical assist required.  Transfers Overall transfer level: Needs assistance Equipment used: Straight cane Transfers: Sit to/from Stand Sit to Stand: Min guard         General transfer comment: No physical assist required for sit to stand from EOB; min guard for safety. Increased time required but pt able to manage without physical assist.    Balance Overall balance assessment: Needs assistance Sitting-balance support: Feet supported;No upper extremity supported Sitting balance-Leahy Scale: Fair     Standing balance support: Single extremity supported Standing balance-Leahy Scale: Fair Standing balance comment: Cane for L UE support                    ADL Overall ADL's : Needs assistance/impaired                                     Functional mobility during ADLs: Min guard;Cane General ADL Comments: Educated pt on R hand exercises with use of squeeze ball, edema management techniques. Discussed home safety and fall prevention strategies. Pt reports red foam to assist with self-feeding is working well. Pt able to self feed with use of build up handles; educated on use of red foam on toothbrush too if needed.      Vision                     Perception     Praxis      Cognition   Behavior During Therapy: WFL for tasks assessed/performed Overall Cognitive Status: Within Functional Limits for tasks assessed                       Extremity/Trunk Assessment               Exercises Other Exercises Other Exercises: Pt tolerating squeezing squeeze ball x 10, rolling ball against hard surface x 10 with R hand.  Other Exercises: Pt tolerated digit AAROM x 10 each digit on R hand. Other Exercises: Pt tolerated retrograde massage to each digit on R hand x 10.   Shoulder Instructions       General Comments      Pertinent Vitals/  Pain       Pain Assessment: No/denies pain  Home Living                                          Prior Functioning/Environment              Frequency Min 2X/week     Progress Toward Goals  OT Goals(current goals can now be found in the care plan section)  Progress towards OT goals: Progressing toward goals  Acute Rehab OT Goals Patient Stated Goal: to go home OT Goal Formulation: With patient/family  Plan Discharge plan needs to be updated    Co-evaluation                 End of Session Equipment Utilized During Treatment: Gait belt;Other (comment) [facility])   Activity Tolerance Patient tolerated treatment well   Patient Left Other (comment) (in hallway with PT)   Nurse Communication          Time: 5217-4715 OT Time  Calculation (min): 18 min  Charges: OT General Charges $OT Visit: 1 Procedure OT Treatments $Therapeutic Exercise: 8-22 mins  Binnie Kand M.S., OTR/L Pager: [phone]  01/11/2016, 10:15 AM

## 2016-01-11 NOTE — Progress Notes (Signed)
STROKE TEAM PROGRESS NOTE  SUBJECTIVE : he remains stable without recurrent seizures.D/w patient and mother and plan is to go home and get outpt therapy Social worker is in room with patient giving him vouchers for vimpat OBJECTIVE:   Filed Vitals:   01/11/16 1500 01/11/16 1530  BP: 94/72 98/73  Pulse: 79 82  Temp:    Resp:       Physical exam  . Afebrile. Head is nontraumatic. Neck is supple without bruit.    Cardiac exam no murmur or gallop. Lungs are clear to auscultation. Distal pulses are well felt.  Neurological exam :   Mental Status: Awake and fully alert. Oriented to place and time. Recent and remote memory intact. Attention span, concentration and fund of knowledge Is slightly impaired. Mood and affect appropriate. Moderate expressive aphasia with word finding difficulties and disfluency. Good comprehension, naming and repetition. Cranial Nerves: Fundoscopic exam reveals sharp disc margins. Pupils equal, briskly reactive to light. Extraocular movements full without nystagmus. Visual fields full to confrontation. Hearing intact. Moderate right lower facial weakness. Facial sensation intact. Face, tongue, palate moves normally and symmetrically.  Motor: Spastic right hemiparesis with 4/5 right shoulder and elbow strength with significant weakness of the right grip and intrinsic hand muscles. 4/5 right lower extremity strength with spasticity and increased tone. Ankle dorsiflexors week. Sensory.: touch ,pinprick .position and vibratory sensation slightly diminished on the right compared to the left.  Coordination: Impaired on the right side and normal on the left  Gait and Station: Arises from chair without difficulty. Spastic hemiplegic gait with dragging of the right foot and circumduction.  Reflexes: 1+ and symmetric. Toes downgoing. . No results found.   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  in January 2017 secondary to paradoxical embolism from deep and thrombosis, pulmonary embolism and patent foramen ovale while he was off Coumadin due to GI bleeding with prolonged hospitalization in [name] but was transferred here for rehabilitation he has residual expressive aphasia and spastic right hemiparesis  New onset seizure on 01/06/16 Prolonged postictal phase now recovering close to baseline Vascular Risk Factors:  diabetes, hypertension, sleep apnea  PLAN:   I have personally examined this patient, reviewed notes, independently viewed imaging studies, participated in medical decision making and plan of care. I have made any additions or clarifications directly to the above note.  Continue vimpat 100 mg twice daily and aggressive risk factor modification for stroke .Follow-up as an outpatient in the stroke clinic as scheduled previously.I had a long d/w his wife at bedside and answered questions.   I spent 15  Minutes during this visit with greater than 50% of time spent in counseling and coordination of care with regards to  Seizure and  stroke risk Prevention  Antony Contras, MD Medical Director [facility] Pager: [phone] 01/11/2016 4:16 PM

## 2016-01-11 NOTE — Progress Notes (Signed)
Discharge orders received. Pt for discharge home today with family. IV and telemetry D/C'd. Prescription and D/C instructions given with verbalized understanding. Family at bedside to assist with D/C. Staff brought Pt downstairs via wheelchair for D/C. Fortino Sic, RN, BSN 01/11/2016 8:21 PM

## 2016-01-11 NOTE — Progress Notes (Signed)
Physical Therapy Treatment Patient Details Name: Isaiah [name] [facility] MRN: [id number] DOB: 05/23/1966 Today's Date: 01/11/2016    History of Present Illness 50 y.o. male admitted to [facility] on 01/06/16 for seizure and acute metabolic encepholopathy.  Pt with significant PMHx of DM, sickle cell-thalassemia disease, ESRD on HD MWF, gout, HTN, and A-fib.    PT Comments    Pt progressing towards physical therapy goals. Was able to ambulate this session with no assistance, however hands-on guarding was required for safety. Pt's mother present during session and states he is improving to what his "new" baseline has been s/p CVA. Will continue to follow and progress as able per POC.   Follow Up Recommendations  Home health PT;Supervision for mobility/OOB     Equipment Recommendations  None recommended by PT    Recommendations for Other Services       Precautions / Restrictions Precautions Precautions: Fall Precaution Comments: due to right hemi Restrictions Weight Bearing Restrictions: No    Mobility  Bed Mobility Overal bed mobility: Needs Assistance Bed Mobility: Supine to Sit     Supine to sit: Min guard;HOB elevated     General bed mobility comments: Pt was recevied ambulating with OT  Transfers Overall transfer level: Needs assistance Equipment used: Straight cane Transfers: Sit to/from Stand Sit to Stand: Min guard         General transfer comment: No physical assist required for sit to stand from EOB; min guard for safety. Increased time required but pt able to manage without physical assist.  Ambulation/Gait Ambulation/Gait assistance: Min guard Ambulation Distance (Feet): 200 Feet Assistive device: Straight cane Gait Pattern/deviations: Step-through pattern;Decreased stride length;Shuffle;Decreased dorsiflexion - right Gait velocity: decreased Gait velocity interpretation: Below normal speed for age/gender General Gait Details: Hands-on guarding provided for safety.  Noted R-sided lean which increased with fatigue. Pt motivated for distance and reports that he does not need a rest break.    Stairs            Wheelchair Mobility    Modified Rankin (Stroke Patients Only)       Balance Overall balance assessment: Needs assistance Sitting-balance support: Feet supported;No upper extremity supported Sitting balance-Leahy Scale: Fair     Standing balance support: Single extremity supported Standing balance-Leahy Scale: Fair Standing balance comment: With cane use in LUE.                     Cognition Arousal/Alertness: Awake/alert Behavior During Therapy: WFL for tasks assessed/performed Overall Cognitive Status: Within Functional Limits for tasks assessed                      Exercises Other Exercises Other Exercises: Pt tolerating squeezing squeeze ball x 10, rolling ball against hard surface x 10 with R hand.  Other Exercises: Pt tolerated digit AAROM x 10 each digit on R hand. Other Exercises: Pt tolerated retrograde massage to each digit on R hand x 10.    General Comments        Pertinent Vitals/Pain Pain Assessment: Faces Faces Pain Scale: No hurt    Home Living                      Prior Function            PT Goals (current goals can now be found in the care plan section) Acute Rehab PT Goals Patient Stated Goal: to go home PT Goal Formulation: With patient/family Time For Goal  Achievement: 01/23/16 Potential to Achieve Goals: Good Progress towards PT goals: Progressing toward goals    Frequency  Min 3X/week    PT Plan Current plan remains appropriate    Co-evaluation             End of Session Equipment Utilized During Treatment: Gait belt Activity Tolerance: Patient tolerated treatment well Patient left: in chair;with call bell/phone within reach;with chair alarm set;with family/visitor present     Time: 2182-8833 PT Time Calculation (min) (ACUTE ONLY): 19  min  Charges:  $Gait Training: 8-22 mins                    G Codes:      Rolinda Roan 06-Feb-2016, 12:19 PM   Rolinda Roan, PT, DPT Acute Rehabilitation Services Pager: [phone]

## 2016-01-11 NOTE — Progress Notes (Signed)
Speech Language Pathology Treatment: Dysphagia;Cognitive-Linquistic  Patient Details Name: Isaiah [name] Isaiah [name] MRN: [id number] DOB: 12-02-1965 Today's Date: 01/11/2016 Time: 2947-6546 SLP Time Calculation (min) (ACUTE ONLY): 14 min  Assessment / Plan / Recommendation Clinical Impression  Mastication of solid texture WFL's and performed lingual sweep with min prompts. No s/s aspiration with solid or thin. Pt with increased alertness, awareness and attention. Sustained attention to SLP/MD without cues. Communicating needs/thoughts with additional time due to dysfluency and dysnomia and following one step commands without cues. Recommend upgrade to regular texture and continue thin liquids, lingual sweep on right side oral cavity. Would benefit from continued ST for communication and cognition.    HPI HPI: 50 year old male with end-stage renal disease, hypertension, diabetes, recently had a stroke with residual right-sided weakness, admitted with seizure-like activity witnessed by his wife. Patient has been having a stroke at the beginning of this year, has an inpatient followed by outpatient rehabilitation and has been doing well recently. Pt had a decline in am on 5/1 and MRI si being repeated. Pt has a history of dysarthria from prior CVA and was being seen at OP SLP, but no history of dysphagia.       SLP Plan  Discharge SLP treatment due to (comment);All goals met     Recommendations  Diet recommendations: Regular;Thin liquid Liquids provided via: Cup;No straw Medication Administration: Whole meds with puree Supervision: Patient able to self feed;Intermittent supervision to cue for compensatory strategies Compensations: Slow rate;Small sips/bites;Lingual sweep for clearance of pocketing;Monitor for anterior loss;Follow solids with liquid Postural Changes and/or Swallow Maneuvers: Seated upright 90 degrees             Oral Care Recommendations: Oral care BID Follow up Recommendations:  Outpatient SLP (for cognition) Plan: Discharge SLP treatment due to (comment);All goals met     GO                Houston Siren 01/11/2016, 2:31 PM   Orbie Pyo Colvin Caroli.Ed [employer] [phone]

## 2016-01-11 NOTE — Progress Notes (Signed)
[facility] Progress Note   Subjective: 'I'm getting out today".  Alert, oriented, up in chair, waiting to go to HD. Today is regular HD day.  C/O pain in R hand distal to AVF.   Objective Filed Vitals:   01/10/16 1837 01/10/16 2110 01/11/16 0124 01/11/16 0534  BP: 120/82 120/74 104/64 115/79  Pulse: 78 69 71 79  Temp: 98.6 F (37 C) 98.1 F (36.7 C) 98 F (36.7 C) 97.3 F (36.3 C)  TempSrc: Oral Oral Oral Oral  Resp: 20 20 20 20   Height:      Weight:      SpO2: 99% 100% 94% 100%   Physical Exam General: well nourished, NAD Heart: S1, S2, II/VI systolic M. No JVD Lungs: Bilateral breath sounds CTA Abdomen: abdomen soft non-tender. Condom cath-clear yellow urine.  Extremities: No LE edema, no cyanosis, pulse intact Dialysis Access: RIJ perm cath Drsg CDI, RFA AVF maturing. R hand swollen, painful to touch. Ice pack on hand. + bruit.     Additional Objective Labs: Basic Metabolic Panel:  Recent Labs Lab 01/07/16 1658 01/10/16 0651 01/11/16 0400  NA 136 138 141  K 3.3* 4.1 4.5  CL 101 101 101  CO2 22 26 25   GLUCOSE 109* 99 99  BUN 41* 26* 36*  CREATININE 7.34* 5.62* 6.15*  CALCIUM 7.0* 7.2* 7.1*  PHOS 5.1* 5.3* 5.9*   Liver Function Tests:  Recent Labs Lab 01/06/16 1144 01/07/16 1658 01/10/16 0651 01/11/16 0400  AST 30  --   --   --   ALT 14*  --   --   --   ALKPHOS 113  --   --   --   BILITOT 1.2  --   --   --   PROT 8.0  --   --   --   ALBUMIN 3.3* 3.3* 3.0* 3.0*   No results for input(s): LIPASE, AMYLASE in the last 168 hours. CBC:  Recent Labs Lab 01/06/16 1144  01/07/16 0500 01/08/16 0517 01/09/16 0636 01/10/16 0708 01/11/16 0343  WBC 20.4*  --  18.3* 14.8* 16.1* 15.7* 14.8*  NEUTROABS 13.3*  --   --   --   --   --   --   HGB 9.2*  < > 9.2* 9.1* 10.4* 9.9* 9.5*  HCT 25.3*  < > 25.1* 25.6* 29.6* 27.8* 26.4*  MCV 80.1  --  79.4 78.5 80.7 78.3 77.9*  PLT 420*  --  539* 513* 487* 465* 477*  < > = values in this interval  not displayed. Blood Culture    Component Value Date/Time   SDES BLOOD LEFT HAND 01/07/2016 2024   SPECREQUEST BOTTLES DRAWN AEROBIC AND ANAEROBIC 10CC 01/07/2016 2024   CULT NO GROWTH 3 DAYS 01/07/2016 2024   REPTSTATUS PENDING 01/07/2016 2024     CBG:  Recent Labs Lab 01/10/16 0618 01/10/16 1141 01/10/16 1618 01/10/16 2206 01/11/16 0625  GLUCAP 99 229* 131* 123* 96   Studies/Results: No results found. Medications:   . aspirin EC  81 mg Oral Daily  . fluticasone furoate-vilanterol  1 puff Inhalation Daily  . insulin aspart  0-5 Units Subcutaneous QHS  . insulin aspart  0-9 Units Subcutaneous TID WC  . insulin glargine  5 Units Subcutaneous QHS  . lacosamide  100 mg Oral BID  . metoprolol tartrate  12.5 mg Oral BID  . midodrine  2.5 mg Oral BID WC  . pantoprazole  40 mg Oral BID  . sodium chloride flush  3 mL Intravenous Q12H  . Warfarin - Pharmacist Dosing Inpatient   Does not apply q1800   Dialysis orders: Dialysis: [facility] MWF 4h 41mn 89kg 2/2.25 bath RFA AVF maturing (3/28)/ R IJ cath  Venofer 50/ wk Mircera 100 q 2, last 4/19 Calc 0.5 ug   Assessment: 1. Seizures, new onset w depressed MS, suspected drug tox (baclofen, should not be used in dialysis patients at all). Resolved.  2. ESRD -MWF @ [facility] For regular scheduled HD today-got off schedule while in hospital. K+ 4.5.  3. Anemia - HGB 9.5 No ESA ordered. Followed HGB 4. Secondary hyperparathyroidism - currently receiving sensipar VDRA. Phos 5.1. Ca 7.1 C Ca 7.9 Last in-center C Ca 9.2 (12/19/15). Here hypocalcemic, stopped sensipar and gave IV Ca++ 5/3.Use 3.5 Ca bath today and change on DC orders.  5. HTN/volume -HD today  3-4 liters today. Keep SBP > 90. Adjust EDW on DC.  6. Nutrition - Albumin 3.0 Dys 2 diet. Add renal vit/supplement.  7. H/O Afib. Coumadin resumed per pharmacy. INR 2.21 8. Swollen R Hand distal to new AVF: Asked VVS to evaluate before DC.  8. dispo - OK for dc from renal  standpoint.   Plan - DC today after HD.      Rita H. Brown NP-C 01/11/2016, 11:17 AM  [facility] [phone] Pt seen, examined and agree w A/P as above.  RKelly SplinterMD CNewell Rubbermaidpager [phone]   cell [phone]/01/2016, 12:59 PM

## 2016-01-11 NOTE — Progress Notes (Signed)
Patient seen and examined.  Denies new complaints.  Thinking feels clear.  No new weakness, numbness.  Denies fevers, chills.  Exam unchanged from prior.  No changes to discharge summary or discharge medications.  Did write a 14-day prescription for vimpat so that family can receive 2 weeks free.  Follow up with Dr. Leonie Man in 1 month for seizure follow up.

## 2016-01-12 LAB — CULTURE, BLOOD (ROUTINE X 2): Culture: NO GROWTH

## 2016-01-13 LAB — CULTURE, BLOOD (ROUTINE X 2)

## 2016-01-14 ENCOUNTER — Ambulatory Visit: Payer: BLUE CROSS/BLUE SHIELD | Admitting: Occupational Therapy

## 2016-01-14 ENCOUNTER — Ambulatory Visit: Payer: BLUE CROSS/BLUE SHIELD | Admitting: Physical Therapy

## 2016-01-14 ENCOUNTER — Other Ambulatory Visit: Payer: Self-pay | Admitting: Physical Medicine and Rehabilitation

## 2016-01-14 ENCOUNTER — Ambulatory Visit: Payer: BLUE CROSS/BLUE SHIELD

## 2016-01-14 NOTE — Telephone Encounter (Signed)
Patient's pharmacy sent electronic request for Warfarin

## 2016-01-15 DIAGNOSIS — G4733 Obstructive sleep apnea (adult) (pediatric): Secondary | ICD-10-CM | POA: Diagnosis not present

## 2016-01-15 NOTE — Therapy (Signed)
[place] [address] [place], [place], [address] Phone: [phone]   Fax:  [phone]  Patient Details  Name: Isaiah [name] [facility] MRN: [id number] Date of Birth: 04-Jun-1966 Referring Provider:  No ref. provider found  Encounter Date: 01/15/2016  PHYSICAL THERAPY DISCHARGE SUMMARY  Visits from Start of Care: 3  Current functional level related to goals / functional outcomes:     PT Short Term Goals - 01/04/16 1324    PT SHORT TERM GOAL #1   Title Pt will be IND in HEP to improve strength, balance, and safety. Target date: 01/11/16   Status On-going   PT SHORT TERM GOAL #2   Title Pt will improve BERG score to >/=48/56 to decr. falls risk. Target date: 01/11/16   Status On-going   PT SHORT TERM GOAL #3   Title Pt will improve gait speed to >/=2.3f/sec. without AD to amb. safely in the community. Target date: 01/11/16   Status On-going   PT SHORT TERM GOAL #4   Title Pt will amb. 300' over even/uneven terrain with LRAD at MOD I level. Target date: 01/11/16   Status On-going         PT Long Term Goals - 01/04/16 1325    PT LONG TERM GOAL #1   Title Pt will verbalize understanding of CVA risk factors and signs/symptoms to reduce risk of additional CVA. Target date: 02/08/16   Status On-going   PT LONG TERM GOAL #2   Title Pt will improve BERG score to >/=52/56 to decr. falls risk. Target date: 02/08/16   Status On-going   PT LONG TERM GOAL #3   Title Pt will amb. 200' over even terrain IND without AD to improve functional mobility. Target date: 02/08/16   Status On-going   PT LONG TERM GOAL #4   Title Pt will amb. 600' over even/uneven terrain IND without AD to improve functional moblity. Target date: 02/08/16   Status On-going   PT LONG TERM GOAL #5   Title Assess stairs and write goal if necessary. Target date: 02/08/16   Baseline Pt will ascend/descend 12 steps with one handrail at MOD I level to traverse steps at home.    Status On-going   PT LONG TERM GOAL #6   Title Pt will improve SOT composite score to WNL for his age (approx. 70) to improve balance and safety. Target date: 02/08/16   Status New        Remaining deficits: Unknown, as pt admitted to hospital and did not return, as he was d/c with HHPT.   Education / Equipment: HEP  Plan: Patient agrees to discharge.  Patient goals were not met. Patient is being discharged due to a change in medical status.  ?????       Jericha Bryden L 01/15/2016, 1:54 PM  CTaconic Shores99315 South LaneSMiddlesex NAl[place] [address]Phone: [phone]  Fax:  [phone]  JGeoffry Paradise PT,DPT 01/15/2016 1:55 PM Phone: [phone]: [phone]

## 2016-01-16 ENCOUNTER — Telehealth: Payer: Self-pay | Admitting: Pharmacist

## 2016-01-16 MED FILL — CONTOUR NEXT STRIPS: 33 days supply | Qty: 100 | Fill #0

## 2016-01-16 NOTE — Telephone Encounter (Signed)
Spoke with patient regarding Coumadin refill sent to [place]. His PCP Dr. Jeanann Lewandowsky manages his Coumadin. He has enough Coumadin and reports that Dr. Carlis Abbott will send in his refills.

## 2016-01-17 ENCOUNTER — Encounter: Payer: Self-pay | Admitting: Vascular Surgery

## 2016-01-17 ENCOUNTER — Encounter: Payer: Self-pay | Admitting: Occupational Therapy

## 2016-01-17 ENCOUNTER — Encounter: Payer: BLUE CROSS/BLUE SHIELD | Attending: Physical Medicine & Rehabilitation

## 2016-01-17 ENCOUNTER — Encounter: Payer: Self-pay | Admitting: Physical Medicine & Rehabilitation

## 2016-01-17 ENCOUNTER — Ambulatory Visit: Payer: Self-pay

## 2016-01-17 ENCOUNTER — Ambulatory Visit (INDEPENDENT_AMBULATORY_CARE_PROVIDER_SITE_OTHER): Payer: BLUE CROSS/BLUE SHIELD | Admitting: Vascular Surgery

## 2016-01-17 ENCOUNTER — Ambulatory Visit (HOSPITAL_COMMUNITY)
Admission: RE | Admit: 2016-01-17 | Discharge: 2016-01-17 | Disposition: A | Discharge status: Home / Self Care | Payer: BLUE CROSS/BLUE SHIELD | Source: Ambulatory Visit | Attending: Vascular Surgery | Admitting: Vascular Surgery

## 2016-01-17 ENCOUNTER — Ambulatory Visit (HOSPITAL_BASED_OUTPATIENT_CLINIC_OR_DEPARTMENT_OTHER): Payer: BLUE CROSS/BLUE SHIELD | Admitting: Physical Medicine & Rehabilitation

## 2016-01-17 VITALS — BP 115/78 | HR 69 | Temp 98.5°F | Ht 70.0 in | Wt 203.0 lb

## 2016-01-17 VITALS — BP 109/61 | HR 95 | Resp 16

## 2016-01-17 DIAGNOSIS — Z992 Dependence on renal dialysis: Secondary | ICD-10-CM | POA: Diagnosis not present

## 2016-01-17 DIAGNOSIS — E1122 Type 2 diabetes mellitus with diabetic chronic kidney disease: Secondary | ICD-10-CM | POA: Diagnosis not present

## 2016-01-17 DIAGNOSIS — N185 Chronic kidney disease, stage 5: Secondary | ICD-10-CM | POA: Insufficient documentation

## 2016-01-17 DIAGNOSIS — N186 End stage renal disease: Secondary | ICD-10-CM

## 2016-01-17 DIAGNOSIS — I120 Hypertensive chronic kidney disease with stage 5 chronic kidney disease or end stage renal disease: Secondary | ICD-10-CM | POA: Insufficient documentation

## 2016-01-17 DIAGNOSIS — Z8673 Personal history of transient ischemic attack (TIA), and cerebral infarction without residual deficits: Secondary | ICD-10-CM | POA: Diagnosis not present

## 2016-01-17 DIAGNOSIS — Z86711 Personal history of pulmonary embolism: Secondary | ICD-10-CM | POA: Insufficient documentation

## 2016-01-17 DIAGNOSIS — D5740 Sickle-cell thalassemia without crisis: Secondary | ICD-10-CM | POA: Insufficient documentation

## 2016-01-17 DIAGNOSIS — Z9115 Patient's noncompliance with renal dialysis: Secondary | ICD-10-CM | POA: Diagnosis not present

## 2016-01-17 DIAGNOSIS — R51 Headache: Secondary | ICD-10-CM | POA: Diagnosis not present

## 2016-01-17 DIAGNOSIS — N179 Acute kidney failure, unspecified: Secondary | ICD-10-CM | POA: Diagnosis not present

## 2016-01-17 DIAGNOSIS — G8111 Spastic hemiplegia affecting right dominant side: Secondary | ICD-10-CM | POA: Insufficient documentation

## 2016-01-17 DIAGNOSIS — E875 Hyperkalemia: Secondary | ICD-10-CM | POA: Insufficient documentation

## 2016-01-17 DIAGNOSIS — G4733 Obstructive sleep apnea (adult) (pediatric): Secondary | ICD-10-CM | POA: Diagnosis not present

## 2016-01-17 DIAGNOSIS — I69920 Aphasia following unspecified cerebrovascular disease: Secondary | ICD-10-CM | POA: Insufficient documentation

## 2016-01-17 DIAGNOSIS — Z4931 Encounter for adequacy testing for hemodialysis: Secondary | ICD-10-CM

## 2016-01-17 DIAGNOSIS — K859 Acute pancreatitis without necrosis or infection, unspecified: Secondary | ICD-10-CM | POA: Insufficient documentation

## 2016-01-17 MED FILL — WARFARIN SODIUM 5 MG TABLET: 5 | 90 days supply | Qty: 135 | Fill #0

## 2016-01-17 NOTE — Progress Notes (Signed)
Patient is a 50 year old male who returns for postoperative follow-up today. He underwent placement of a right radiocephalic AV fistula on 93/73/4287. He currently is dialyzing via a catheter. He has recently had a stroke and had recovered somewhat from that. He has had decreased strength in his right hand after a recent seizure. He is trying to start exercising the fistula again at this point.  Physical exam:  Filed Vitals:   01/17/16 1451  BP: 115/78  Pulse: 69  Temp: 98.5 F (36.9 C)  TempSrc: Oral  Height: 5' 10"  (1.778 m)  Weight: 203 lb (92.08 kg)  SpO2: 99%    Right upper extremity: Palpable thrill in fistula fistula is palpable over the proximal third of the forearm. Incision is well-healed.  Data: Duplex ultrasound of AV fistula was performed today which shows the fistula is just under 5 mm in diameter. It is 4 mm from the skin surface.  Assessment: Right radiocephalic AV fistula appears to be maturing at this point. It should be ready for use by late June. The patient will follow-up with me in 6 weeks with a repeat duplex ultrasound at that point. He will continue to try to exercise the fistula.  Plan: See above  Ruta Hinds, MD [facility] Office: [phone] Pager: [phone]

## 2016-01-17 NOTE — Patient Instructions (Signed)
No driving for at least 6 months after the stroke and then you will need to be cleared by the neurologist  Have made a referral to outpatient PT OT and speech therapy they will call

## 2016-01-17 NOTE — Progress Notes (Signed)
Subjective:    Patient ID: Isaiah [name], male    DOB: Aug 28, 1966, 50 y.o.   MRN: [id number]  HPI Hospitalized for seizure on 01/06/2016, Had repeat MRI while in the hospital which showed expected evolution of the left MCA distribution CVA but no new infarcts. EEG did not show seizure activity but was not performed during the time of seizure. Was taken off baclofen, started on Vimpat. Pain Inventory Average Pain 4 Pain Right Now 4 My pain is intermittent and aching  In the last 24 hours, has pain interfered with the following? General activity 3 Relation with others 10 Enjoyment of life 10 What TIME of day is your pain at its worst? evening Sleep (in general) Good  Pain is worse with: NA Pain improves with: NA Relief from Meds: 6  Mobility walk without assistance walk with assistance ability to climb steps?  yes do you drive?  no  Function employed # of hrs/week FMLA I need assistance with the following:  bathing and meal prep  Neuro/Psych No problems in this area  Prior Studies Any changes since last visit?  yes  Physicians involved in your care Any changes since last visit?  yes Neurologist Dr. Leonie Man; Patient had a Grand Mal seizure 12/2015   Family History  Problem Relation Age of Onset  . Hypertension Mother   . Diabetes Mother   . Sickle cell anemia Brother   . Diabetes type I Brother   . Kidney disease Brother   . Stroke Father    Social History   Social History  . Marital Status: Married    Spouse Name: sylvia  . Number of Children: 1  . Years of Education: college   Occupational History  . [place] History Main Topics  . Smoking status: Never Smoker   . Smokeless tobacco: Never Used  . Alcohol Use: No  . Drug Use: No  . Sexual Activity: Yes   Other Topics Concern  . None   Social History Narrative   ** Merged History Encounter **       Past Surgical History  Procedure Laterality Date  . Cholecystectomy     . Bone marrow biopsy    . Other surgical history      Retinal surgery  . Pars plana vitrectomy  02/17/2012    Procedure: PARS PLANA VITRECTOMY WITH 25 GAUGE;  Surgeon: Hayden Pedro, MD;  Location: [facility];  Service: Ophthalmology;  Laterality: Right;  . Cholecystectomy  1990's?  . Eye surgery Right   . Insertion of dialysis catheter Right 10/16/2015    Procedure: INSERTION OF PALINDROME DIALYSIS CATHETER ;  Surgeon: Elam Dutch, MD;  Location: [facility];  Service: Vascular;  Laterality: Right;  . Av fistula placement Right 12/05/2015    Procedure: INSERTION OF ARTERIOVENOUS (AV) GORE-TEX GRAFT ARM;  Surgeon: Elam Dutch, MD;  Location: [facility] OR;  Service: Vascular;  Laterality: Right;   Past Medical History  Diagnosis Date  . Diabetes mellitus without complication ([place])   . Sickle cell-thalassemia disease ([place])     a. Sickle cell trait.  . Pulmonary embolism ([place]) 07/2011    a. Tx with Coumadin for 6 months (unknown cause per patient).  . Hyperkalemia 07/2011  . Acute renal failure ([place]) 07/2011  . Monoclonal gammopathy   . Sleep apnea     a. uses CPAP.  [name] Hypertension   . Gout   . Pulmonary embolism ([place]) 07/2011; 09/27/2014  a. Bilat PE 07/2011 - unclear cause, tx with 6 months Coumadin.;   . Monoclonal gammopathies   . Anemia   . Asthma   . OSA on CPAP   . History of recent blood transfusion 10/27/14    2 Units PRBC's  . ESRD (end stage renal disease) on dialysis ([place])   . Stroke ([place])   . A-fib ([facility])    BP 109/61 mmHg  Pulse 95  Resp 16  SpO2 100%  Opioid Risk Score:   Fall Risk Score:  `1  Depression screen PHQ 2/9  Depression screen [facility] 2/9 12/11/2015 11/16/2015 03/22/2015  Decreased Interest 0 0 0  Down, Depressed, Hopeless 0 0 0  PHQ - 2 Score 0 0 0  Altered sleeping - 0 -  Tired, decreased energy - 0 -  Change in appetite - 0 -  Feeling bad or failure about yourself  - 0 -  Trouble concentrating - 0 -  Moving slowly or fidgety/restless - 0 -  Suicidal  thoughts - 0 -  PHQ-9 Score - 0 -      Review of Systems  All other systems reviewed and are negative.      Objective:   Physical Exam  Constitutional: He is oriented to person, place, and time. He appears well-developed and well-nourished.  HENT:  Head: Normocephalic and atraumatic.  Eyes: Conjunctivae and EOM are normal. Pupils are equal, round, and reactive to light.  Neck: Normal range of motion.  Musculoskeletal:       Right shoulder: He exhibits decreased strength.       Left shoulder: Normal.  Neurological: He is alert and oriented to person, place, and time. No sensory deficit. He exhibits normal muscle tone. Coordination and gait abnormal.  Motor strength is 3 minus at the right deltoid, biceps, triceps, grip 4+ at the right hip flexor knee extensor and ankle dorsiflexor left side is 5/5 in the upper and lower limb Sensation is reported as equal to light touch is able to correctly identify fingers by wiggling them. Has difficulty naming which finger was touched. Has difficulty with naming in general, speech is disfluent Patient has positive dysdiadochokinesis with rapid alternating movements of the right upper extremity Is able to oppose thumb to index middle and ring fingers on the right but not the little finger Normal standard gait but has to use a cane, unable to do tandem gait  Psychiatric: He has a normal mood and affect.  Nursing note and vitals reviewed.         Assessment & Plan:  1. Left MCA distribution infarct with right hemiparesis, aphasia, gait disorder. He has had a post stroke seizure and we explained that this is the most common cause of adult seizures. It is difficult to say how much the low-dose baclofen contributed. He is currently on Vimpat and will follow up with neurology. We discussed that he needs to transition back to outpatient therapy for PT OT and speech No driving for at least 6 months after the seizure and will need to be cleared by  neurology. Over half of the 25 min visit was spent counseling and coordinating care.He discussed his care as well as his set back. I do think this is just a temporary setback and I do not think he will have any long-term consequences of his seizure. I explained this to the patient as well as his wife.

## 2016-01-18 ENCOUNTER — Other Ambulatory Visit: Payer: Self-pay | Admitting: Vascular Surgery

## 2016-01-18 DIAGNOSIS — N186 End stage renal disease: Secondary | ICD-10-CM

## 2016-01-18 DIAGNOSIS — Z992 Dependence on renal dialysis: Principal | ICD-10-CM

## 2016-01-22 ENCOUNTER — Encounter: Payer: Self-pay | Admitting: Occupational Therapy

## 2016-01-22 ENCOUNTER — Ambulatory Visit: Payer: Self-pay

## 2016-01-24 ENCOUNTER — Ambulatory Visit: Payer: Self-pay

## 2016-01-24 ENCOUNTER — Encounter: Payer: Self-pay | Admitting: Speech Pathology

## 2016-01-24 ENCOUNTER — Ambulatory Visit (INDEPENDENT_AMBULATORY_CARE_PROVIDER_SITE_OTHER): Payer: BLUE CROSS/BLUE SHIELD | Admitting: Ophthalmology

## 2016-01-24 ENCOUNTER — Encounter: Payer: Self-pay | Admitting: Occupational Therapy

## 2016-01-24 DIAGNOSIS — I10 Essential (primary) hypertension: Secondary | ICD-10-CM

## 2016-01-24 DIAGNOSIS — H35033 Hypertensive retinopathy, bilateral: Secondary | ICD-10-CM

## 2016-01-24 DIAGNOSIS — H43813 Vitreous degeneration, bilateral: Secondary | ICD-10-CM | POA: Diagnosis not present

## 2016-01-24 DIAGNOSIS — H3523 Other non-diabetic proliferative retinopathy, bilateral: Secondary | ICD-10-CM | POA: Diagnosis not present

## 2016-01-28 ENCOUNTER — Encounter: Payer: Self-pay | Admitting: Occupational Therapy

## 2016-01-28 ENCOUNTER — Ambulatory Visit: Payer: Self-pay | Admitting: Physical Therapy

## 2016-01-29 MED FILL — LANTUS SOLOSTAR 100 UNITS/M: 100 | 60 days supply | Qty: 15 | Fill #0

## 2016-01-31 ENCOUNTER — Encounter: Payer: Self-pay | Admitting: Occupational Therapy

## 2016-01-31 ENCOUNTER — Ambulatory Visit: Payer: Self-pay

## 2016-02-05 ENCOUNTER — Encounter: Payer: Self-pay | Admitting: Occupational Therapy

## 2016-02-05 ENCOUNTER — Ambulatory Visit: Payer: Self-pay

## 2016-02-06 MED FILL — VIMPAT 100 MG TABLET: 100 | 90 days supply | Qty: 180 | Fill #0

## 2016-02-07 ENCOUNTER — Encounter: Payer: Self-pay | Admitting: Occupational Therapy

## 2016-02-07 ENCOUNTER — Ambulatory Visit: Payer: Self-pay

## 2016-02-07 DIAGNOSIS — D509 Iron deficiency anemia, unspecified: Secondary | ICD-10-CM | POA: Diagnosis not present

## 2016-02-08 MED FILL — UNIFINE PENTIPS 8MM 31G: 31G X 8 MM | 90 days supply | Qty: 100 | Fill #0

## 2016-02-11 MED FILL — BREO ELLIPTA 200-25 MCG INH: 200-25 | 30 days supply | Qty: 60 | Fill #1

## 2016-02-12 ENCOUNTER — Ambulatory Visit: Payer: Self-pay | Admitting: Physical Therapy

## 2016-02-12 ENCOUNTER — Encounter: Payer: Self-pay | Admitting: Occupational Therapy

## 2016-02-14 ENCOUNTER — Ambulatory Visit: Payer: Self-pay

## 2016-02-14 ENCOUNTER — Encounter: Payer: Self-pay | Admitting: Occupational Therapy

## 2016-02-14 DIAGNOSIS — I6789 Other cerebrovascular disease: Secondary | ICD-10-CM | POA: Diagnosis not present

## 2016-02-14 DIAGNOSIS — E1165 Type 2 diabetes mellitus with hyperglycemia: Secondary | ICD-10-CM | POA: Diagnosis not present

## 2016-02-14 DIAGNOSIS — Z7901 Long term (current) use of anticoagulants: Secondary | ICD-10-CM | POA: Diagnosis not present

## 2016-02-14 DIAGNOSIS — G40309 Generalized idiopathic epilepsy and epileptic syndromes, not intractable, without status epilepticus: Secondary | ICD-10-CM | POA: Diagnosis not present

## 2016-02-20 ENCOUNTER — Encounter: Payer: Self-pay | Admitting: Occupational Therapy

## 2016-02-20 ENCOUNTER — Ambulatory Visit: Payer: Self-pay | Admitting: Physical Therapy

## 2016-02-21 ENCOUNTER — Ambulatory Visit: Payer: Self-pay

## 2016-02-21 ENCOUNTER — Encounter: Payer: Self-pay | Admitting: Occupational Therapy

## 2016-02-21 ENCOUNTER — Ambulatory Visit: Payer: BLUE CROSS/BLUE SHIELD | Admitting: Occupational Therapy

## 2016-02-21 ENCOUNTER — Ambulatory Visit: Payer: BLUE CROSS/BLUE SHIELD | Attending: Physical Medicine & Rehabilitation | Admitting: Physical Therapy

## 2016-02-21 ENCOUNTER — Ambulatory Visit: Payer: BLUE CROSS/BLUE SHIELD

## 2016-02-21 DIAGNOSIS — I69315 Cognitive social or emotional deficit following cerebral infarction: Secondary | ICD-10-CM | POA: Diagnosis present

## 2016-02-21 DIAGNOSIS — R41841 Cognitive communication deficit: Secondary | ICD-10-CM | POA: Diagnosis not present

## 2016-02-21 DIAGNOSIS — M6281 Muscle weakness (generalized): Secondary | ICD-10-CM

## 2016-02-21 DIAGNOSIS — R2689 Other abnormalities of gait and mobility: Secondary | ICD-10-CM | POA: Diagnosis not present

## 2016-02-21 DIAGNOSIS — R278 Other lack of coordination: Secondary | ICD-10-CM | POA: Diagnosis not present

## 2016-02-21 DIAGNOSIS — I69351 Hemiplegia and hemiparesis following cerebral infarction affecting right dominant side: Secondary | ICD-10-CM | POA: Insufficient documentation

## 2016-02-21 DIAGNOSIS — R482 Apraxia: Secondary | ICD-10-CM | POA: Diagnosis not present

## 2016-02-21 DIAGNOSIS — R471 Dysarthria and anarthria: Secondary | ICD-10-CM | POA: Insufficient documentation

## 2016-02-21 DIAGNOSIS — R2681 Unsteadiness on feet: Secondary | ICD-10-CM | POA: Insufficient documentation

## 2016-02-21 DIAGNOSIS — R279 Unspecified lack of coordination: Secondary | ICD-10-CM | POA: Diagnosis not present

## 2016-02-21 NOTE — Patient Instructions (Signed)
Overarticulate when you talk, and SLOW your talking down

## 2016-02-21 NOTE — Therapy (Signed)
[facility] [address] [place], [place], [address] Phone: [phone]   Fax:  [phone]  Speech Language Pathology Evaluation  Patient Details  Name: Isaiah [name] [facility] MRN: [id number] Date of Birth: [birth year] Referring Provider: Alysia Penna, MD  Encounter Date: 02/21/2016      End of Session - 02/21/16 1711    Visit Number 4   Number of Visits 20   Date for SLP Re-Evaluation 05/02/16   SLP Start Time 40   SLP Stop Time  1145   SLP Time Calculation (min) 40 min   Activity Tolerance Patient tolerated treatment well      Past Medical History  Diagnosis Date  . Diabetes mellitus without complication ([place])   . Sickle cell-thalassemia disease ([place])     a. Sickle cell trait.  . Pulmonary embolism ([place]) 07/2011    a. Tx with Coumadin for 6 months (unknown cause per patient).  . Hyperkalemia 07/2011  . Acute renal failure ([place]) 07/2011  . Monoclonal gammopathy   . Sleep apnea     a. uses CPAP.  [name] Hypertension   . Gout   . Pulmonary embolism ([place]) 07/2011; 09/27/2014    a. Bilat PE 07/2011 - unclear cause, tx with 6 months Coumadin.;   . Monoclonal gammopathies   . Anemia   . Asthma   . OSA on CPAP   . History of recent blood transfusion 10/27/14    2 Units PRBC's  . ESRD (end stage renal disease) on dialysis ([place])   . Stroke ([place])   . A-fib [facility])     Past Surgical History  Procedure Laterality Date  . Cholecystectomy    . Bone marrow biopsy    . Other surgical history      Retinal surgery  . Pars plana vitrectomy  02/17/2012    Procedure: PARS PLANA VITRECTOMY WITH 25 GAUGE;  Surgeon: Hayden Pedro, MD;  Location: [facility];  Service: Ophthalmology;  Laterality: Right;  . Cholecystectomy  1990's?  . Eye surgery Right   . Insertion of dialysis catheter Right 10/16/2015    Procedure: INSERTION OF PALINDROME DIALYSIS CATHETER ;  Surgeon: Elam Dutch, MD;  Location: [facility];  Service: Vascular;   Laterality: Right;  . Av fistula placement Right 12/05/2015    Procedure: INSERTION OF ARTERIOVENOUS (AV) GORE-TEX GRAFT ARM;  Surgeon: Elam Dutch, MD;  Location: [facility];  Service: Vascular;  Laterality: Right;    There were no vitals filed for this visit.          SLP Evaluation [facility] - 02/21/16 1700    General Information   HPI Pt returned today for the first time since his seizure activity in late April 2017. Pt had dysarthria, mild dysfluency, apraxia, and possible cognitive linguistic deficits at that time.    Prior Functional Status   Cognitive/Linguistic Baseline Within functional limits   Type of Home House    Lives With Family   Vocation Full time employment   Pain Assessment   Pain Assessment No/denies pain   Cognition   Overall Cognitive Status Difficult to assess  due to time constraints   Auditory Comprehension   Overall Auditory Comprehension Appears within functional limits for tasks assessed   Verbal Expression   Overall Verbal Expression Appears within functional limits for tasks assessed   Oral Motor/Sensory Function   Overall Oral Motor/Sensory Function Impaired   Labial ROM Reduced right   Labial Symmetry --  mild   Lingual  ROM Within Functional Limits   Lingual Strength Other (comment)  difficult to assess due to oral nonverbal apraxia   Lingual Coordination Reduced  better with reduced speed of alternative motion   Motor Speech   Overall Motor Speech Impaired   Respiration Within functional limits   Phonation Normal   Resonance Within functional limits   Articulation Impaired   Level of Impairment Sentence   Intelligibility Intelligibility reduced   Word 75-100% accurate   Phrase --  95%   Sentence --  90%   Conversation 75-100% accurate  85-90%   Motor Planning Impaired   Level of Impairment Sentence   Motor Speech Errors Aware   Effective Techniques Slow rate;Over-articulate                      ADULT SLP TREATMENT  - 02/21/16 1653    General Information   Behavior/Cognition Alert;Cooperative;Pleasant mood   Treatment Provided   Treatment provided --   Cognitive-Linquistic Treatment   Treatment focused on --   Skilled Treatment --           SLP Education - 02/21/16 1711    Education provided Yes   Education Details ST course of therapy   Person(s) Educated Patient;Parent(s)   Methods Explanation   Comprehension Verbalized understanding          SLP Short Term Goals - 02/21/16 1715    SLP SHORT TERM GOAL #1   Title pt will demo HEP for apraxia/motor plannning with independence over 3 sessions   Baseline began new POC 02-21-16   Time 4   Period Weeks  or 8 visits, for all STGs   Status On-going   SLP SHORT TERM GOAL #2   Title pt will demo HEP for dysarthria with independence over 3 sessions   Time 4   Period Weeks   Status On-going   SLP SHORT TERM GOAL #3   Title pt will demo speech 95% accurate in sentence-length responses with modified independence (compensations) over 3 sessions   Time 4   Period Weeks   Status On-going          SLP Long Term Goals - 02/21/16 1717    SLP LONG TERM GOAL #1   Title pt will demo functional speech in 20 minutes mod complex/complex conversation with modified independnence   Baseline new POC on 02-21-16   Time 8   Period Weeks  or 16 visits, for all LTGs   SLP LONG TERM GOAL #2   Title pt will demo HEP for speech-motor planning indpendently over 5 total sessions    Time 8   Period Weeks   Status On-going   SLP LONG TERM GOAL #3   Title pt will demo HEP for dysarthria independently over 5 total sessions   Time 8   Period Weeks   Status On-going          Plan - 02/21/16 1713    Clinical Impression Statement Pt cont to present with motor speech deficit, specifically mod apraxia, after re-evaluation today. 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 Cognitive abilities will be screened next visitt Pt is motivated for change.  Skilled ST would cont to benefit pt for incr'd independence and possible return to work.   Speech Therapy Frequency 2x / week   Duration --  8 weeks, or 16 sessions  Treatment/Interventions SLP instruction and feedback;Compensatory strategies;Internal/external aids;Oral motor exercises;Patient/family education;Functional tasks;Cueing hierarchy   Potential to Achieve Goals Good   Potential Considerations Severity of impairments   Consulted and Agree with Plan of Care Patient      Patient will benefit from skilled therapeutic intervention in order to improve the following deficits and impairments:   Apraxia      G-Codes - 2016-02-22 1722    Functional Assessment Tool Used noms - 5   Functional Limitations Motor speech   Motor Speech Current Status [phone]) At least 20 percent but less than 40 percent impaired, limited or restricted   Motor Speech Goal Status ([id number]) At least 1 percent but less than 20 percent impaired, limited or restricted      Problem List Patient Active Problem List   Diagnosis Date Noted  . Acute encephalopathy 01/08/2016  . Seizures ([employer]) 01/08/2016  . History of ischemic left MCA stroke 01/08/2016  . Right sided weakness 01/08/2016  . Chronic combined systolic and diastolic CHF (congestive heart failure) ([place]) 01/08/2016  . Atrial fibrillation ([place]) 01/08/2016  . Leukocytosis 01/08/2016  . Benign essential HTN 01/08/2016  . Anemia of chronic disease 01/08/2016  . Controlled type 2 diabetes mellitus with diabetic nephropathy, without long-term current use of insulin ([place]) 01/08/2016  . History of DVT (deep vein thrombosis) 01/08/2016  . ESRD (end stage renal disease) on dialysis [facility])     [place] ,[place], [place]  02-22-2016, 5:24 PM  [facility] [address] [place], [place], [address] Phone:  [phone]   Fax:  [phone]  Name: Tanya Marvin [facility] MRN: [id number] Date of Birth: 08/05/1966

## 2016-02-21 NOTE — Therapy (Signed)
[place] [address] [place], [place], [address] Phone: [phone]   Fax:  [phone]  Occupational Therapy Evaluation  Patient Details  Name: Isaiah [name] [facility] MRN: [id number] Date of Birth: 04/01/1966 Referring Provider: Alysia Penna  Encounter Date: 02/21/2016      OT End of Session - 02/21/16 1519    Visit Number 1   Number of Visits 17   Date for OT Re-Evaluation 05/01/16   Authorization Type [employer]   Authorization Time Period "$20 co pay, no visit limit, no auth required"   Authorization - Visit Number 1   Authorization - Number of Visits 10   OT Start Time 1017   OT Stop Time 1100   OT Time Calculation (min) 43 min   Activity Tolerance Patient tolerated treatment well   Behavior During Therapy [facility] for tasks assessed/performed      Past Medical History  Diagnosis Date  . Diabetes mellitus without complication ([place])   . Sickle cell-thalassemia disease ([place])     a. Sickle cell trait.  . Pulmonary embolism ([place]) 07/2011    a. Tx with Coumadin for 6 months (unknown cause per patient).  . Hyperkalemia 07/2011  . Acute renal failure ([place]) 07/2011  . Monoclonal gammopathy   . Sleep apnea     a. uses CPAP.  [name] Hypertension   . Gout   . Pulmonary embolism ([place]) 07/2011; 09/27/2014    a. Bilat PE 07/2011 - unclear cause, tx with 6 months Coumadin.;   . Monoclonal gammopathies   . Anemia   . Asthma   . OSA on CPAP   . History of recent blood transfusion 10/27/14    2 Units PRBC's  . ESRD (end stage renal disease) on dialysis ([place])   . Stroke ([place])   . A-fib [name])     Past Surgical History  Procedure Laterality Date  . Cholecystectomy    . Bone marrow biopsy    . Other surgical history      Retinal surgery  . Pars plana vitrectomy  02/17/2012    Procedure: PARS PLANA VITRECTOMY WITH 25 GAUGE;  Surgeon: Hayden Pedro, MD;  Location: [place];  Service: Ophthalmology;  Laterality: Right;  .  Cholecystectomy  1990's?  . Eye surgery Right   . Insertion of dialysis catheter Right 10/16/2015    Procedure: INSERTION OF PALINDROME DIALYSIS CATHETER ;  Surgeon: Elam Dutch, MD;  Location: [place];  Service: Vascular;  Laterality: Right;  . Av fistula placement Right 12/05/2015    Procedure: INSERTION OF ARTERIOVENOUS (AV) GORE-TEX GRAFT ARM;  Surgeon: Elam Dutch, MD;  Location: [place];  Service: Vascular;  Laterality: Right;    There were no vitals filed for this visit.      Subjective Assessment - 02/21/16 1031    Subjective  I want to strengthen my hand.  I have limited movement in some areas.     Patient is accompained by: Family member  mom   Pertinent History see Epic    Patient Stated Goals to use right hand better   Currently in Pain? No/denies   Pain Score 0-No pain           [facility] OT Assessment - 02/21/16 1033    Assessment   Diagnosis L MCA CVA, Encephalopathy/ seizure   Referring Provider Alysia Penna   Onset Date 10/02/15   Assessment Inpatient rehab, home health, and prior OP therapy - disrupted by recent hospitalization for seizure.  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[place];Door   Designer, jewellery   Lives With Spouse;Son   Prior Function   Level of Independence Independent with basic ADLs;Independent with homemaking with ambulation   Vocation Full time employment   Catering manager [employer]   Leisure listen to music   ADL   Eating/Feeding Modified independent   Eating/Feeding Patient Percentage --  using right hand intermittently   Grooming Shaving   Grooming Patient Percentage 50%   Lower Body Dressing Minimal assistance   Tub/Shower Transfer --   Unable to shower currently due to dialysis port   IADL   Prior Level of Function Shopping independent   Shopping Assistance for transportation   Prior Level of Function Light Housekeeping independent   Light Housekeeping Performs light daily tasks such as dishwashing, bed making   Prior Level of Function Meal Prep independent   Meal Prep Able to complete simple cold meal and snack prep;Able to complete simple warm meal prep   Prior Level of Function Community Mobility independent   Community Mobility Relies on family or friends for transportation   Written Expression   Dominant Hand Right   Handwriting Not legible   Vision - History   Baseline Vision Wears glasses only for reading  bifocals   Visual History Corrective eye surgery   Additional Comments Reports needing an increased prescription strength since stroke   Vision Assessment   Eye Alignment Within Functional Limits   Ocular Range of Motion Within Functional Limits   Alignment/Gaze Preference Within Defined Limits   Tracking/Visual Pursuits Able to track stimulus in all quads without difficulty   Saccades Within functional limits   Activity Tolerance   Activity Tolerance Tolerates 10-20 min activity with muiltiple rests   Cognition   Overall Cognitive Status Impaired/Different from baseline  delayed processing   Area of Impairment --  slow processing   Observation/Other Assessments   Focus on Therapeutic Outcomes (FOTO)  FOTO Intake incomplete   Sensation   Light Touch Appears Intact   Proprioception Appears Intact   Coordination   Fine Motor Movements are Fluid and Coordinated No   Coordination and Movement Description apraxia   Finger Nose Finger Test impaired   Box and Blocks 10 RUE   Coordination impaired for RUE   Edema   Edema mild in right hand   ROM / Strength   AROM / PROM / Strength AROM;Strength   AROM   Overall AROM  Deficits  Right Upper extremity   AROM Assessment Site Shoulder   Right/Left  Shoulder Right   Right Shoulder Flexion 115 Degrees   Right Shoulder ABduction 112 Degrees   Right/Left Elbow Right   Right Elbow Flexion --  WFL   Right Elbow Extension --  WFL   Right/Left Forearm Right   Right Forearm Pronation 90 Degrees   Right Forearm Supination 70 Degrees   Right/Left Wrist Right   Right Wrist Extension 25 Degrees   Right Wrist Flexion 65 Degrees   Strength   Overall Strength Deficits   Overall Strength Comments Poor proximal control for shoulder motion   Hand Function   Right Hand Gross Grasp Impaired   Right Hand Grip (lbs) 10   Left Hand Gross Grasp  Functional   Left Hand Grip (lbs) 80                         OT Education - 02/21/16 1518    Education provided Yes   Education Details OT discussed evaluation findings and potential functional goals.     Person(s) Educated Patient;Parent(s)   Methods Explanation   Comprehension Verbalized understanding          OT Short Term Goals - 02/21/16 1639    OT SHORT TERM GOAL #1   Title I with initiall HEP   Time 4   Period Weeks   Status New   OT SHORT TERM GOAL #2   Title Pt will perform all basic ADLS with supervision.   Time 4   Period Weeks   Status New   OT SHORT TERM GOAL #3   Title Patient will demonstrate improved functional strength in right UE as evidenced by brushing teeth with dominant right hand   Time 4   Period Weeks   Status New   OT SHORT TERM GOAL #4   Title Pt will demonstrate ability to retrieve a light weight (less than 1 lb) object at 120 shoulder flexion with RUE.   Time 4   Period Weeks   Status New   OT SHORT TERM GOAL #5   Title Patient will demostrate improved coordination for right hand as evidenced by 15 on box and blocks test   Time 4   Period Weeks   Status New   OT SHORT TERM GOAL #6   Title Pt will report feeding himself with RUE 75% of the time.   Time 4   Period Weeks   Status New           OT Long Term Goals - 02/21/16 1646     OT LONG TERM GOAL #1   Title I with updated HEP.   Time 8   Period Weeks   Status New   OT LONG TERM GOAL #2   Title Pt will demonstrate ability to write his name legibily.   Time 8   Period Weeks   Status New   OT LONG TERM GOAL #3   Title Patient will demonstrate adequate strength and coordination in dominant right hand to tighten laces and tie shoes using bilateral technique   Time 8   Period Weeks   Status New   OT LONG TERM GOAL #4   Title Assess 9 hole peg test and determine goal   OT LONG TERM GOAL #5   Title Pt will demonstrate RUE grip strength of 20+ lbs for increased functional use.   Time 8   Period Weeks   Status New   OT LONG TERM GOAL #6   Title Pt will perform all basic ADLs and simple meal prep modified independently.   Time 8   Period Weeks   Status New               Plan - 02/21/16 1522    Clinical Impression Statement Patient is a 50 year old man who had been receiving OP OT services here recently for impairments resulting from L MCA CVA, until he was hospitalized following a seizure. Patient returns for OT evaluation today with new diagnosis of encephalopathy and seizure disorder.  Patient has a very complicated medical history  including ESRD - on HD 3x/week, PE, DVT, Sickle cell disease, hypertension, Diabetes Mellitus, Congestive Heart Failure, and Atrial Fibrillation.  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  Patient currently requires minimal assistance for basic self care skills, is unable to drive or live independenlty.  Patient presents with the following deficits, aphasia, apraxia, hemiparesis in right extremities (dominant) generalized weakness, and delayed prcessing.  Patient will benefit from skilled OT Intervention to increase independence with ADL / IADL and improve functional use of right UE.      Rehab Potential Good   OT Frequency 2x / week   OT  Duration 8 weeks   OT Treatment/Interventions Self-care/ADL training;Therapeutic exercise;Patient/family education;Balance training;Splinting;Manual Therapy;Neuromuscular education;Ultrasound;Therapeutic exercises;Therapeutic activities;DME and/or AE instruction;Parrafin;Cryotherapy;Electrical Stimulation;Fluidtherapy;Scar mobilization;Cognitive remediation/compensation;Moist Heat;Contrast Bath;Passive range of motion;Visual/perceptual remediation/compensation;Functional Mobility Training   Plan NMR RUE   Consulted and Agree with Plan of Care Patient      Patient will benefit from skilled therapeutic intervention in order to improve the following deficits and impairments:  Cardiopulmonary status limiting activity, Decreased activity tolerance, Decreased balance, Decreased cognition, Decreased endurance, Decreased coordination, Decreased strength, Increased edema, Impaired tone, Impaired UE functional use, Impaired vision/preception  Visit Diagnosis: Hemiplegia and hemiparesis following cerebral infarction affecting right dominant side ([place]) - Plan: Ot plan of care cert/re-cert  Other lack of coordination - Plan: Ot plan of care cert/re-cert  Apraxia - Plan: Ot plan of care cert/re-cert  Muscle weakness (generalized) - Plan: Ot plan of care cert/re-cert  Cognitive social or emotional deficit following cerebral infarction - Plan: Ot plan of care cert/re-cert      G-Codes - 49/70/26 1656    Functional Assessment Tool Used skilled clinical judgement, box and blocks   Functional Limitation Carrying, moving and handling objects   Carrying, Moving and Handling Objects Current Status [phone]) At least 60 percent but less than 80 percent impaired, limited or restricted   Carrying, Moving and Handling Objects Goal Status ([id number]) At least 20 percent but less than 40 percent impaired, limited or restricted      Problem List Patient Active Problem List   Diagnosis Date Noted  . Acute  encephalopathy 01/08/2016  . Seizures ([place]) 01/08/2016  . History of ischemic left MCA stroke 01/08/2016  . Right sided weakness 01/08/2016  . Chronic combined systolic and diastolic CHF (congestive heart failure) ([place]) 01/08/2016  . Atrial fibrillation ([place]) 01/08/2016  . Leukocytosis 01/08/2016  . Benign essential HTN 01/08/2016  . Anemia of chronic disease 01/08/2016  . Controlled type 2 diabetes mellitus with diabetic nephropathy, without long-term current use of insulin ([place]) 01/08/2016  . History of DVT (deep vein thrombosis) 01/08/2016  . ESRD (end stage renal disease) on dialysis [facility])     Mariah Milling, OTR/L 02/21/2016, 5:00 PM  [place] [address] [place], [place], [address] Phone: [phone]   Fax:  [phone]  Name: Isaiah [name] [facility] MRN: [id number] Date of Birth: May 13, 1966

## 2016-02-22 DIAGNOSIS — R278 Other lack of coordination: Secondary | ICD-10-CM | POA: Diagnosis not present

## 2016-02-22 DIAGNOSIS — R482 Apraxia: Secondary | ICD-10-CM | POA: Diagnosis not present

## 2016-02-22 DIAGNOSIS — R471 Dysarthria and anarthria: Secondary | ICD-10-CM | POA: Diagnosis not present

## 2016-02-22 DIAGNOSIS — M6281 Muscle weakness (generalized): Secondary | ICD-10-CM | POA: Diagnosis not present

## 2016-02-22 DIAGNOSIS — I69351 Hemiplegia and hemiparesis following cerebral infarction affecting right dominant side: Secondary | ICD-10-CM | POA: Diagnosis not present

## 2016-02-22 DIAGNOSIS — R279 Unspecified lack of coordination: Secondary | ICD-10-CM | POA: Diagnosis not present

## 2016-02-22 DIAGNOSIS — R2681 Unsteadiness on feet: Secondary | ICD-10-CM | POA: Diagnosis not present

## 2016-02-22 DIAGNOSIS — R2689 Other abnormalities of gait and mobility: Secondary | ICD-10-CM | POA: Diagnosis not present

## 2016-02-22 DIAGNOSIS — R41841 Cognitive communication deficit: Secondary | ICD-10-CM | POA: Diagnosis not present

## 2016-02-22 NOTE — Therapy (Signed)
[facility] [address] [place], [place], [address] Phone: [phone]   Fax:  [phone]  Physical Therapy Evaluation  Patient Details  Name: Isaiah [name] [facility] MRN: [id number] Date of Birth: Jun 22, 1966 Referring Provider: Dr. Letta Pate  Encounter Date: 02/21/2016      PT End of Session - 02/22/16 0807    Visit Number 1  New episode of care starting 02/21/16   Number of Visits 17   Date for PT Re-Evaluation 02/12/16   Authorization Type BCBS   PT Start Time [phone]  Pt started late-trying to complete FOTO   PT Stop Time 1017   PT Time Calculation (min) 39 min   Equipment Utilized During Treatment Gait belt   Activity Tolerance Patient tolerated treatment well   Behavior During Therapy [facility] for tasks assessed/performed      Past Medical History  Diagnosis Date  . Diabetes mellitus without complication ([place])   . Sickle cell-thalassemia disease ([place])     a. Sickle cell trait.  . Pulmonary embolism ([place]) 07/2011    a. Tx with Coumadin for 6 months (unknown cause per patient).  . Hyperkalemia 07/2011  . Acute renal failure ([place]) 07/2011  . Monoclonal gammopathy   . Sleep apnea     a. uses CPAP.  [name] Hypertension   . Gout   . Pulmonary embolism ([place]) 07/2011; 09/27/2014    a. Bilat PE 07/2011 - unclear cause, tx with 6 months Coumadin.;   . Monoclonal gammopathies   . Anemia   . Asthma   . OSA on CPAP   . History of recent blood transfusion 10/27/14    2 Units PRBC's  . ESRD (end stage renal disease) on dialysis ([place])   . Stroke ([place])   . A-fib [name])     Past Surgical History  Procedure Laterality Date  . Cholecystectomy    . Bone marrow biopsy    . Other surgical history      Retinal surgery  . Pars plana vitrectomy  02/17/2012    Procedure: PARS PLANA VITRECTOMY WITH 25 GAUGE;  Surgeon: Hayden Pedro, MD;  Location: [place];  Service: Ophthalmology;  Laterality: Right;  . Cholecystectomy  1990's?  . Eye  surgery Right   . Insertion of dialysis catheter Right 10/16/2015    Procedure: INSERTION OF PALINDROME DIALYSIS CATHETER ;  Surgeon: Elam Dutch, MD;  Location: [place];  Service: Vascular;  Laterality: Right;  . Av fistula placement Right 12/05/2015    Procedure: INSERTION OF ARTERIOVENOUS (AV) GORE-TEX GRAFT ARM;  Surgeon: Elam Dutch, MD;  Location: [place];  Service: Vascular;  Laterality: Right;    There were no vitals filed for this visit.       Subjective Assessment - 02/21/16 0943    Subjective Pt is a 50 year old male who had CVA in January 2017, with R sided weakness.  He was coming to outpatient therapy, until he had a seizure in April 2017.  Pt feels it set him back as far as weakness is concerned.  He is using cane.  No falls since most recent hospitalization.   Patient is accompained by: Family member  mother   Pertinent History Seizure 12/2015; DVT, PE-has IVC filter in placed, DM, CKD-stage V on dialysis, sickle cell anemia, OSA, asthma, HTN, tachycardia in hospital (12/06/15), AVF    Patient Stated Goals Pt's goal for therapy is to get back to full range of motion and return to work.  Currently in Pain? No/denies            [facility] PT Assessment - 02/21/16 0945    Assessment   Medical Diagnosis Spastic hemiplegia s/p CVA   Referring Provider Dr. Letta Pate   Onset Date/Surgical Date --  April 2017   Hand Dominance Right   Prior Therapy HHPT after most recent hospitalization   Precautions   Precautions Fall   Precaution Comments Dialysis M, W, F; fistula in R arm/no BP in R arm   Balance Screen   Has the patient fallen in the past 6 months No   Has the patient had a decrease in activity level because of a fear of falling?  No   Is the patient reluctant to leave their home because of a fear of falling?  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   Leisure watch his students play sports   Observation/Other Assessments   Focus on Therapeutic Outcomes (FOTO)  FOTO incomplete   Posture/Postural Control   Posture/Postural Control Postural limitations   Postural Limitations Forward head;Rounded Shoulders  R shoulder lower than L   Strength   Overall Strength Deficits   Overall Strength Comments grossly tested at least 4/5 bilateral lower extremities   Transfers   Transfers Sit to Stand;Stand to Sit   Sit to Stand 5: Supervision;With upper extremity assist;From chair/3-in-1   Stand to Sit 5: Supervision;With upper extremity assist;To chair/3-in-1   Ambulation/Gait   Ambulation/Gait Yes   Ambulation/Gait Assistance 5: Supervision   Ambulation Distance (Feet) 75 Feet  then 120   Assistive device None;Straight cane   Gait Pattern Step-through pattern;Decreased arm swing - right;Decreased step length - right;Decreased stance time - right;Decreased dorsiflexion - right;Poor foot clearance - right   Ambulation Surface Level;Indoor   Gait velocity 12.52 sec = 2.62 ft/sec   Stairs Yes   Stairs Assistance 4: Min guard   Stair Management Technique One rail Left;Alternating pattern   Number of Stairs 4   Height of Stairs 6   Standardized Balance Assessment   Standardized Balance Assessment Timed Up and Go Test;Berg Balance Test;Dynamic Gait Index   Berg Balance Test   Sit to Stand Able to stand without using hands and stabilize independently   Standing  Unsupported Able to stand safely 2 minutes   Sitting with Back Unsupported but Feet Supported on Floor or Stool Able to sit safely and securely 2 minutes   Stand to Sit Sits safely with minimal use of hands   Transfers Able to transfer safely, minor use of hands   Standing Unsupported with Eyes Closed Able to stand 10 seconds with supervision   Standing Ubsupported with Feet Together Able to place feet together independently and stand 1 minute safely   From Standing, Reach Forward with Outstretched Arm Can reach forward >12 cm safely (5")   From Standing Position, Pick  up Object from [place] to pick up shoe safely and easily   From Standing Position, Turn to Look Behind Over each Shoulder Looks behind from both sides and weight shifts well   Turn 360 Degrees Able to turn 360 degrees safely one side only in 4 seconds or less   Standing Unsupported, Alternately Place Feet on Step/Stool Able to complete >2 steps/needs minimal assist   Standing Unsupported, One Foot in Front Able to plae foot ahead of the other independently and hold 30 seconds   Standing on One Leg Tries to lift leg/unable to hold 3 seconds but remains standing independently   Total Score 46   Berg comment: Scores <45/56 indicate increased fall risk.  Pt has difficulty with single limb stance activities)   Dynamic Gait Index   Level Surface Moderate Impairment  9.74 sec   Change in Gait Speed Mild Impairment   Gait with Horizontal Head Turns Moderate Impairment   Gait with Vertical Head Turns Mild Impairment   Gait and Pivot Turn Normal   Step Over Obstacle Mild Impairment   Step Around Obstacles Mild Impairment   Steps Mild Impairment   Total Score 15   DGI comment: Scores <19/24 indicate increased fall risk.   Timed Up and Go Test   Normal TUG (seconds) 17.91  with cane; 13.9 sec no device   TUG Comments Scores >13.5 seconds indicates increased fall risk.                             PT Short  Term Goals - 02/22/16 1243    PT SHORT TERM GOAL #1   Title Pt will perform HEP independenlty for improved strength, balance and gait.  TARGET 03/23/16   Time 4   Period Weeks   Status New   PT SHORT TERM GOAL #2   Title Pt will improve Berg Score to at least 49/56 for decreased fall risk.   Time 4   Period Weeks   Status New   PT SHORT TERM GOAL #3   Title Pt will improve TUG score to less than or equal to 13.5 seconds for decreased fall risk.   Time 4   Period Weeks   Status New   PT SHORT TERM GOAL #4   Title Pt will amb. 300' over even/uneven terrain with LRAD at MOD I level.   Status New           PT Long Term Goals - 02/22/16 1245    PT LONG TERM GOAL #1   Title Pt will verbalize understanding of fall prevention in the home environment.  TARGET 04/22/16   Time 8   Period Weeks   Status New   PT LONG TERM GOAL #2   Title Pt will improve Dynamic Gait Index score to at least 19/24 for decreased fall risk.   Time 8   Period Weeks   Status New   PT LONG TERM GOAL #3   Title Pt will improve single limb stance to at least 3 seconds each leg for improved negotiation of steps and obstacles.   Time 8   Period Weeks   Status New   PT LONG TERM GOAL #4   Title Pt will amb. 600' over even/uneven terrain IND without AD to improve functional moblity.    Time 8   Period Weeks   Status New   PT LONG TERM GOAL #5   Title Pt will ascend/descent  12 steps with one handrail at MOD I level to negotiate steps at home.   Time 8   Period Weeks   Status New               Plan - 03-Mar-2016 1239    Clinical Impression Statement Pt presents today for PT evaluation, following hospitalization/HHPT after seizure in April 2017.  Pt has medical history of CVA with R sided weakness in January 2017, which he was receiving therapy for at this clinic prior to his seizure. Pt presents with decreases gait velocity, decreased muscle strength, decreased static and dynamic balance, gait  abnormality and postural abnormality.  Pt  would benefit from skilled physical therapy to address the above stated deficits to reduce fall risk and improve funcitonal mobility.   Rehab Potential Good   Clinical Impairments Affecting Rehab Potential co-morbidities   PT Frequency 2x / week   PT Duration 8 weeks   PT Treatment/Interventions ADLs/Self Care Home Management;Therapeutic exercise;Therapeutic activities;Functional mobility training;Gait training;Balance training;Neuromuscular re-education;Patient/family education   PT Next Visit Plan Re-establish HEP for balance and strengthening; work on compliant surface balance (per previous SOT), dynamic balance and gait progression without cane   Consulted and Agree with Plan of Care Patient;Family member/caregiver   Family Member Consulted Mom      Patient will benefit from skilled therapeutic intervention in order to improve the following deficits and impairments:  Abnormal gait, Decreased balance, Decreased mobility, Decreased strength, Difficulty walking, Postural dysfunction  Visit Diagnosis: Other abnormalities of gait and mobility  Unspecified lack of coordination  Unsteadiness on feet      G-Codes - Mar 03, 2016 1248    Functional Assessment Tool Used DGI 15/24, TUG 17.91 sec with cane; 13.9 sec no device; Merrilee Jansky 46/56    Functional Limitation Mobility: Walking and moving around   Mobility: Walking and Moving Around Current Status [id number]) At least 20 percent but less than 40 percent impaired, limited or restricted   Mobility: Walking and Moving Around Goal Status [id number]) At least 1 percent but less than 20 percent impaired, limited or restricted       Problem List Patient Active Problem List   Diagnosis Date Noted  . Acute encephalopathy 01/08/2016  . Seizures ([place]) 01/08/2016  . History of ischemic left MCA stroke 01/08/2016  . Right sided weakness 01/08/2016  . Chronic combined systolic and diastolic CHF (congestive heart  failure) ([place]) 01/08/2016  . Atrial fibrillation ([place]) 01/08/2016  . Leukocytosis 01/08/2016  . Benign essential HTN 01/08/2016  . Anemia of chronic disease 01/08/2016  . Controlled type 2 diabetes mellitus with diabetic nephropathy, without long-term current use of insulin ([place]) 01/08/2016  . History of DVT (deep vein thrombosis) 01/08/2016  . ESRD (end stage renal disease) on dialysis [facility])     Gala Padovano W. 2016-03-03, 12:50 PM  Frazier Butt., PT  [facility] [address] [place], [place], [address] Phone: [phone]   Fax:  [phone]  Name: Suhas Estis [name] MRN: [id number] Date of Birth: 05-22-1966

## 2016-02-25 ENCOUNTER — Other Ambulatory Visit: Payer: Self-pay | Admitting: Pharmacist

## 2016-02-25 VITALS — BP 121/80

## 2016-02-25 DIAGNOSIS — E1121 Type 2 diabetes mellitus with diabetic nephropathy: Secondary | ICD-10-CM

## 2016-02-25 MED FILL — METOPROLOL TARTRATE 25 MG T: 25 | 90 days supply | Qty: 180 | Fill #0

## 2016-02-26 ENCOUNTER — Encounter: Payer: Self-pay | Admitting: Occupational Therapy

## 2016-02-26 ENCOUNTER — Ambulatory Visit: Payer: BLUE CROSS/BLUE SHIELD | Admitting: Physical Therapy

## 2016-02-26 ENCOUNTER — Ambulatory Visit: Payer: BLUE CROSS/BLUE SHIELD | Admitting: Occupational Therapy

## 2016-02-26 DIAGNOSIS — R279 Unspecified lack of coordination: Secondary | ICD-10-CM | POA: Diagnosis not present

## 2016-02-26 DIAGNOSIS — R471 Dysarthria and anarthria: Secondary | ICD-10-CM | POA: Diagnosis not present

## 2016-02-26 DIAGNOSIS — I69351 Hemiplegia and hemiparesis following cerebral infarction affecting right dominant side: Secondary | ICD-10-CM

## 2016-02-26 DIAGNOSIS — I69315 Cognitive social or emotional deficit following cerebral infarction: Secondary | ICD-10-CM

## 2016-02-26 DIAGNOSIS — M6281 Muscle weakness (generalized): Secondary | ICD-10-CM | POA: Diagnosis not present

## 2016-02-26 DIAGNOSIS — R2681 Unsteadiness on feet: Secondary | ICD-10-CM | POA: Diagnosis not present

## 2016-02-26 DIAGNOSIS — R278 Other lack of coordination: Secondary | ICD-10-CM | POA: Diagnosis not present

## 2016-02-26 DIAGNOSIS — R2689 Other abnormalities of gait and mobility: Secondary | ICD-10-CM | POA: Diagnosis not present

## 2016-02-26 DIAGNOSIS — R482 Apraxia: Secondary | ICD-10-CM | POA: Diagnosis not present

## 2016-02-26 DIAGNOSIS — R41841 Cognitive communication deficit: Secondary | ICD-10-CM | POA: Diagnosis not present

## 2016-02-26 NOTE — Therapy (Signed)
[facility] [address] [place], [place], [address] Phone: [phone]   Fax:  [phone]  Occupational Therapy Treatment  Patient Details  Name: Isaiah [name] [facility] MRN: [id number] Date of Birth: 04-Jan-1966 Referring Provider: Alysia Penna  Encounter Date: 02/26/2016      OT End of Session - 02/26/16 1007    Visit Number 2   Number of Visits 17   Date for OT Re-Evaluation 05/01/16   Authorization Type medicare   Authorization Time Period "$20 co pay, no visit limit, no auth required"   Authorization - Visit Number 2   Authorization - Number of Visits 10   OT Start Time 0845   OT Stop Time 0929   OT Time Calculation (min) 44 min   Activity Tolerance Patient tolerated treatment well      Past Medical History  Diagnosis Date  . Diabetes mellitus without complication ([place])   . Sickle cell-thalassemia disease ([place])     a. Sickle cell trait.  . Pulmonary embolism ([place]) 07/2011    a. Tx with Coumadin for 6 months (unknown cause per patient).  . Hyperkalemia 07/2011  . Acute renal failure ([place]) 07/2011  . Monoclonal gammopathy   . Sleep apnea     a. uses CPAP.  [name] Hypertension   . Gout   . Pulmonary embolism ([place]) 07/2011; 09/27/2014    a. Bilat PE 07/2011 - unclear cause, tx with 6 months Coumadin.;   . Monoclonal gammopathies   . Anemia   . Asthma   . OSA on CPAP   . History of recent blood transfusion 10/27/14    2 Units PRBC's  . ESRD (end stage renal disease) on dialysis ([place])   . Stroke ([place])   . A-fib [facility])     Past Surgical History  Procedure Laterality Date  . Cholecystectomy    . Bone marrow biopsy    . Other surgical history      Retinal surgery  . Pars plana vitrectomy  02/17/2012    Procedure: PARS PLANA VITRECTOMY WITH 25 GAUGE;  Surgeon: Hayden Pedro, MD;  Location: [facility];  Service: Ophthalmology;  Laterality: Right;  . Cholecystectomy  1990's?  . Eye surgery Right   . Insertion of  dialysis catheter Right 10/16/2015    Procedure: INSERTION OF PALINDROME DIALYSIS CATHETER ;  Surgeon: Elam Dutch, MD;  Location: [facility];  Service: Vascular;  Laterality: Right;  . Av fistula placement Right 12/05/2015    Procedure: INSERTION OF ARTERIOVENOUS (AV) GORE-TEX GRAFT ARM;  Surgeon: Elam Dutch, MD;  Location: [place];  Service: Vascular;  Laterality: Right;    There were no vitals filed for this visit.      Subjective Assessment - 02/26/16 0808    Subjective  My right hand is a little bit better   Pertinent History see Epic    Patient Stated Goals to use right hand better   Currently in Pain? No/denies                      OT Treatments/Exercises (OP) - 02/26/16 0001    Exercises   Exercises Hand   Hand Exercises   Theraputty - Grip Pt issued HEP using red putty for grip and pinch. See pt instructions for details.  Pt able to return demonstrate all activities and recommended reps.     Hand Gripper with Small Beads 25# of resistance picking up small blocks with min dropping. Pt has  significant improvement in use of R hand    Manual Therapy   Manual Therapy Joint mobilization;Edema management   Manual therapy comments Pt arrived with moderate edema R hand especially in index finger and thumb. Aggressive edema massage followed by joint mob as pt had 10/10 pain with flexion of index finger.  By end of session pt had full composite flexion (active assist for end range) with no pain. Pt educatd in edema massage technique as well as active assist for composite flexion and able to return demonstrate.                 OT Education - 02/26/16 1005    Education provided Yes   Education Details putty with red, edema mgmt   Person(s) Educated Patient   Methods Explanation;Demonstration;Verbal cues;Handout   Comprehension Verbalized understanding;Returned demonstration          OT Short Term Goals - 02/26/16 1006    OT SHORT TERM GOAL #1   Title I  with initiall HEP   Time 4   Period Weeks   Status On-going   OT SHORT TERM GOAL #2   Title Pt will perform all basic ADLS with supervision.   Time 4   Period Weeks   Status On-going   OT SHORT TERM GOAL #3   Title Patient will demonstrate improved functional strength in right UE as evidenced by brushing teeth with dominant right hand   Time 4   Period Weeks   Status On-going   OT SHORT TERM GOAL #4   Title Pt will demonstrate ability to retrieve a light weight (less than 1 lb) object at 120 shoulder flexion with RUE.   Time 4   Period Weeks   Status On-going   OT SHORT TERM GOAL #5   Title Patient will demostrate improved coordination for right hand as evidenced by 15 on box and blocks test   Time 4   Period Weeks   Status On-going   OT SHORT TERM GOAL #6   Title Pt will report feeding himself with RUE 75% of the time.   Time 4   Period Weeks   Status On-going           OT Long Term Goals - 02/26/16 1006    OT LONG TERM GOAL #1   Title I with updated HEP.   Time 8   Period Weeks   Status On-going   OT LONG TERM GOAL #2   Title Pt will demonstrate ability to write his name legibily.   Time 8   Period Weeks   Status On-going   OT LONG TERM GOAL #3   Title Patient will demonstrate adequate strength and coordination in dominant right hand to tighten laces and tie shoes using bilateral technique   Time 8   Period Weeks   Status On-going   OT LONG TERM GOAL #4   Title Assess 9 hole peg test and determine goal   Status On-going   OT LONG TERM GOAL #5   Title Pt will demonstrate RUE grip strength of 20+ lbs for increased functional use.   Time 8   Period Weeks   Status On-going   OT LONG TERM GOAL #6   Title Pt will perform all basic ADLs and simple meal prep modified independently.   Time 8   Period Weeks   Status On-going               Plan - 02/26/16 1006      Clinical Impression Statement Pt progressing toward goals and has improved function in R  hand. Pt using R hand more spontaneoulsy for bilateral activities.   Rehab Potential Good   OT Frequency 2x / week   OT Duration 8 weeks   OT Treatment/Interventions Self-care/ADL training;Therapeutic exercise;Patient/family education;Balance training;Splinting;Manual Therapy;Neuromuscular education;Ultrasound;Therapeutic exercises;Therapeutic activities;DME and/or AE instruction;Parrafin;Cryotherapy;Electrical Stimulation;Fluidtherapy;Scar mobilization;Cognitive remediation/compensation;Moist Heat;Contrast Bath;Passive range of motion;Visual/perceptual remediation/compensation;Functional Mobility Training   Plan NMR RUE, strengthening  and functional use of R hand   OT Home Exercise Plan Education issued:  12/31/15 initial HEP (coordination, cane ex);  added putty on 02/26/2016   Consulted and Agree with Plan of Care Patient      Patient will benefit from skilled therapeutic intervention in order to improve the following deficits and impairments:  Cardiopulmonary status limiting activity, Decreased activity tolerance, Decreased balance, Decreased cognition, Decreased endurance, Decreased coordination, Decreased strength, Increased edema, Impaired tone, Impaired UE functional use, Impaired vision/preception  Visit Diagnosis: Hemiplegia and hemiparesis following cerebral infarction affecting right dominant side (HCC)  Other lack of coordination  Muscle weakness (generalized)  Cognitive social or emotional deficit following cerebral infarction    Problem List Patient Active Problem List   Diagnosis Date Noted  . Acute encephalopathy 01/08/2016  . Seizures ([facility]) 01/08/2016  . History of ischemic left MCA stroke 01/08/2016  . Right sided weakness 01/08/2016  . Chronic combined systolic and diastolic CHF (congestive heart failure) ([facility]) 01/08/2016  . Atrial fibrillation ([facility]) 01/08/2016  . Leukocytosis 01/08/2016  . Benign essential HTN 01/08/2016  . Anemia of chronic disease 01/08/2016   . Controlled type 2 diabetes mellitus with diabetic nephropathy, without long-term current use of insulin ([facility]) 01/08/2016  . History of DVT (deep vein thrombosis) 01/08/2016  . ESRD (end stage renal disease) on dialysis ([facility])     [place], Karen Halliday, OTR/L 02/26/2016, 10:09 AM  [facility] [address] , [place], [address] Phone: [phone]   Fax:  [phone]  Name: Isaiah [name] MRN: [id number] Date of Birth: 12/21/1965   

## 2016-02-26 NOTE — Patient Instructions (Addendum)
1. Grip Strengthening (Resistive Putty)   Squeeze putty using thumb and all fingers. Repeat _20___ times. Do __2__ sessions per day.   2. Roll putty into tube on table and pinch between each finger and thumb x 10 reps each. (can do ring and small finger together)  Also instructed on edema massage and active assist for full composite flexion     Copyright  VHI. All rights reserved.

## 2016-02-26 NOTE — Patient Instructions (Addendum)
ANKLE: Dorsiflexion (Band)    Sit at edge of surface. Place band around top of foot. Keeping heel on floor, raise toes of banded foot.  Use green band.  Remember to pull band tight so you can feel resistance. 15 reps, rest for a minute and do 15 more.  Do twice a day. Copyright  VHI. All rights reserved.    Functional Quadriceps: Sit to Stand    Sit on edge of chair, feet flat on floor. Stand upright, extending knees fully. Repeat 10 times per set. Do 2 sessions per day.  http://orth.exer.us/734   Copyright  VHI. All rights reserved.    Feet Together, Varied Arm Positions - Eyes Closed    Stand in corner with a chair in front of you for support.  Stand with feet together and arms at your side.  Close eyes and visualize upright position. Hold 10 seconds. Repeat 2 times twice a day.  Copyright  VHI. All rights reserved.   Feet Together, Head Motion - Eyes Closed    Stand in the corner with a chair in front of you for support.  With eyes closed and feet together, move head slowly, up and down 10 times.  Repeat moving head side to side 10 times.  Do twice a day.  Copyright  VHI. All rights reserved.   Forward Step Up    Stand at steps holding railing facing step. Step up leading with left leg. Slowly step down leading with left leg.  Do 10 reps then repeat with right leg.  Copyright  VHI. All rights reserved.

## 2016-02-26 NOTE — Therapy (Signed)
[facility] [address] [place], [place], [address] Phone: [phone]   Fax:  [phone]  Physical Therapy Treatment  Patient Details  Name: Isaiah [name] [facility] MRN: [id number] Date of Birth: Nov 03, 1965 Referring Provider: Dr. Letta Pate  Encounter Date: 02/26/2016      PT End of Session - 02/26/16 1128    Visit Number 2  New episode of care starting 02/21/16   Number of Visits 17   Date for PT Re-Evaluation 02/12/16   Authorization Type BCBS   PT Start Time [phone]   PT Stop Time 0930   PT Time Calculation (min) 39 min   Equipment Utilized During Treatment Gait belt   Activity Tolerance Patient tolerated treatment well   Behavior During Therapy [facility] for tasks assessed/performed      Past Medical History  Diagnosis Date  . Diabetes mellitus without complication ([place])   . Sickle cell-thalassemia disease ([place])     a. Sickle cell trait.  . Pulmonary embolism ([place]) 07/2011    a. Tx with Coumadin for 6 months (unknown cause per patient).  . Hyperkalemia 07/2011  . Acute renal failure ([place]) 07/2011  . Monoclonal gammopathy   . Sleep apnea     a. uses CPAP.  [name] Hypertension   . Gout   . Pulmonary embolism ([place]) 07/2011; 09/27/2014    a. Bilat PE 07/2011 - unclear cause, tx with 6 months Coumadin.;   . Monoclonal gammopathies   . Anemia   . Asthma   . OSA on CPAP   . History of recent blood transfusion 10/27/14    2 Units PRBC's  . ESRD (end stage renal disease) on dialysis ([place])   . Stroke ([place])   . A-fib [facility])     Past Surgical History  Procedure Laterality Date  . Cholecystectomy    . Bone marrow biopsy    . Other surgical history      Retinal surgery  . Pars plana vitrectomy  02/17/2012    Procedure: PARS PLANA VITRECTOMY WITH 25 GAUGE;  Surgeon: Hayden Pedro, MD;  Location: [place];  Service: Ophthalmology;  Laterality: Right;  . Cholecystectomy  1990's?  . Eye surgery Right   . Insertion of dialysis  catheter Right 10/16/2015    Procedure: INSERTION OF PALINDROME DIALYSIS CATHETER ;  Surgeon: Elam Dutch, MD;  Location: [place];  Service: Vascular;  Laterality: Right;  . Av fistula placement Right 12/05/2015    Procedure: INSERTION OF ARTERIOVENOUS (AV) GORE-TEX GRAFT ARM;  Surgeon: Elam Dutch, MD;  Location: [place];  Service: Vascular;  Laterality: Right;    There were no vitals filed for this visit.      Subjective Assessment - 02/26/16 0902    Subjective Denies falls or changes.   Pertinent History Seizure 12/2015; DVT, PE-has IVC filter in placed, DM, CKD-stage V on dialysis, sickle cell anemia, OSA, asthma, HTN, tachycardia in hospital (12/06/15), AVF    Patient Stated Goals Pt's goal for therapy is to get back to full range of motion and return to work.   Currently in Pain? No/denies       Gait-220' x 1 without assistive device and supervision with 2 episodes of R foot catching but able to recover self.   Seated R ankle dorsiflexion with green band x 15 x 2 sets.  Standing step up/off 6" step with 1 UE assist x 15 bil LE's. Sit<>stand x 10 for LE strengthening.  Seated R hamstring curls with green  band x 15 reps.  Corner balance activities with eyes closed and eyes open on compliant and noncompliant surfaces with head turns and head nods.  Needing intermittent UE support for balance.           PT Education - 02/26/16 1127    Education provided Yes   Education Details HEP   Person(s) Educated Patient   Methods Explanation;Demonstration;Handout;Verbal cues   Comprehension Verbalized understanding          PT Short Term Goals - 02/22/16 1243    PT SHORT TERM GOAL #1   Title Pt will perform HEP independenlty for improved strength, balance and gait.  TARGET 03/23/16   Time 4   Period Weeks   Status New   PT SHORT TERM GOAL #2   Title Pt will improve Berg Score to at least 49/56 for decreased fall risk.   Time 4   Period Weeks   Status New   PT SHORT TERM  GOAL #3   Title Pt will improve TUG score to less than or equal to 13.5 seconds for decreased fall risk.   Time 4   Period Weeks   Status New   PT SHORT TERM GOAL #4   Title Pt will amb. 300' over even/uneven terrain with LRAD at MOD I level.   Status New           PT Long Term Goals - 02/22/16 1245    PT LONG TERM GOAL #1   Title Pt will verbalize understanding of fall prevention in the home environment.  TARGET 04/22/16   Time 8   Period Weeks   Status New   PT LONG TERM GOAL #2   Title Pt will improve Dynamic Gait Index score to at least 19/24 for decreased fall risk.   Time 8   Period Weeks   Status New   PT LONG TERM GOAL #3   Title Pt will improve single limb stance to at least 3 seconds each leg for improved negotiation of steps and obstacles.   Time 8   Period Weeks   Status New   PT LONG TERM GOAL #4   Title Pt will amb. 600' over even/uneven terrain IND without AD to improve functional moblity.    Time 8   Period Weeks   Status New   PT LONG TERM GOAL #5   Title Pt will ascend/descent 12 steps with one handrail at MOD I level to negotiate steps at home.   Time 8   Period Weeks   Status New               Plan - 02/26/16 1128    Clinical Impression Statement Pt motivated to increase mobility and states he performs HEP as provided by HHPT.  Continues with decreased balance and strength.  continue PT per POC.   Rehab Potential Good   Clinical Impairments Affecting Rehab Potential co-morbidities   PT Frequency 2x / week   PT Duration 8 weeks   PT Treatment/Interventions ADLs/Self Care Home Management;Therapeutic exercise;Therapeutic activities;Functional mobility training;Gait training;Balance training;Neuromuscular re-education;Patient/family education   PT Next Visit Plan Counter balance and strengthening exercises for HEP, continue work on compliant surface balance (per previous SOT), dynamic balance and gait progression without cane   Consulted and  Agree with Plan of Care Patient      Patient will benefit from skilled therapeutic intervention in order to improve the following deficits and impairments:  Abnormal gait, Decreased balance, Decreased mobility, Decreased strength, Difficulty  walking, Postural dysfunction  Visit Diagnosis: Other abnormalities of gait and mobility  Muscle weakness (generalized)     Problem List Patient Active Problem List   Diagnosis Date Noted  . Acute encephalopathy 01/08/2016  . Seizures ([place]) 01/08/2016  . History of ischemic left MCA stroke 01/08/2016  . Right sided weakness 01/08/2016  . Chronic combined systolic and diastolic CHF (congestive heart failure) ([place]) 01/08/2016  . Atrial fibrillation ([place]) 01/08/2016  . Leukocytosis 01/08/2016  . Benign essential HTN 01/08/2016  . Anemia of chronic disease 01/08/2016  . Controlled type 2 diabetes mellitus with diabetic nephropathy, without long-term current use of insulin ([place]) 01/08/2016  . History of DVT (deep vein thrombosis) 01/08/2016  . ESRD (end stage renal disease) on dialysis [facility])     Narda Bonds 02/26/2016, 11:31 AM  [facility] [address] [place], [place], [address] Phone: [phone]   Fax:  [phone]  Name: Isaiah [name] [facility] MRN: [id number] Date of Birth: 1965-10-25

## 2016-02-27 ENCOUNTER — Ambulatory Visit: Payer: BLUE CROSS/BLUE SHIELD | Admitting: Physical Therapy

## 2016-02-27 DIAGNOSIS — R2681 Unsteadiness on feet: Secondary | ICD-10-CM | POA: Diagnosis not present

## 2016-02-27 DIAGNOSIS — M6281 Muscle weakness (generalized): Secondary | ICD-10-CM | POA: Diagnosis not present

## 2016-02-27 DIAGNOSIS — R278 Other lack of coordination: Secondary | ICD-10-CM | POA: Diagnosis not present

## 2016-02-27 DIAGNOSIS — R2689 Other abnormalities of gait and mobility: Secondary | ICD-10-CM | POA: Diagnosis not present

## 2016-02-27 DIAGNOSIS — R482 Apraxia: Secondary | ICD-10-CM | POA: Diagnosis not present

## 2016-02-27 DIAGNOSIS — I69351 Hemiplegia and hemiparesis following cerebral infarction affecting right dominant side: Secondary | ICD-10-CM | POA: Diagnosis not present

## 2016-02-27 DIAGNOSIS — R41841 Cognitive communication deficit: Secondary | ICD-10-CM | POA: Diagnosis not present

## 2016-02-27 DIAGNOSIS — R471 Dysarthria and anarthria: Secondary | ICD-10-CM | POA: Diagnosis not present

## 2016-02-27 DIAGNOSIS — R279 Unspecified lack of coordination: Secondary | ICD-10-CM | POA: Diagnosis not present

## 2016-02-27 NOTE — Therapy (Signed)
[facility] [address] [place], [place], [address] Phone: [phone]   Fax:  [phone]  Physical Therapy Treatment  Patient Details  Name: Isaiah [name] [facility] MRN: [id number] Date of Birth: Jan 21, 1966 Referring Provider: Dr. Letta Pate  Encounter Date: 02/27/2016      PT End of Session - 02/27/16 1313    Visit Number 3  New episode of care starting 02/21/16   Number of Visits 17   Date for PT Re-Evaluation 02/12/16   Authorization Type BCBS   PT Start Time 1105   PT Stop Time 1146   PT Time Calculation (min) 41 min   Equipment Utilized During Treatment Gait belt   Activity Tolerance Patient tolerated treatment well   Behavior During Therapy [facility] for tasks assessed/performed      Past Medical History  Diagnosis Date  . Diabetes mellitus without complication ([place])   . Sickle cell-thalassemia disease ([place])     a. Sickle cell trait.  . Pulmonary embolism ([place]) 07/2011    a. Tx with Coumadin for 6 months (unknown cause per patient).  . Hyperkalemia 07/2011  . Acute renal failure ([place]) 07/2011  . Monoclonal gammopathy   . Sleep apnea     a. uses CPAP.  [name] Hypertension   . Gout   . Pulmonary embolism ([place]) 07/2011; 09/27/2014    a. Bilat PE 07/2011 - unclear cause, tx with 6 months Coumadin.;   . Monoclonal gammopathies   . Anemia   . Asthma   . OSA on CPAP   . History of recent blood transfusion 10/27/14    2 Units PRBC's  . ESRD (end stage renal disease) on dialysis ([place])   . Stroke ([place])   . A-fib [facility])     Past Surgical History  Procedure Laterality Date  . Cholecystectomy    . Bone marrow biopsy    . Other surgical history      Retinal surgery  . Pars plana vitrectomy  02/17/2012    Procedure: PARS PLANA VITRECTOMY WITH 25 GAUGE;  Surgeon: Hayden Pedro, MD;  Location: [place];  Service: Ophthalmology;  Laterality: Right;  . Cholecystectomy  1990's?  . Eye surgery Right   . Insertion of dialysis  catheter Right 10/16/2015    Procedure: INSERTION OF PALINDROME DIALYSIS CATHETER ;  Surgeon: Elam Dutch, MD;  Location: [place];  Service: Vascular;  Laterality: Right;  . Av fistula placement Right 12/05/2015    Procedure: INSERTION OF ARTERIOVENOUS (AV) GORE-TEX GRAFT ARM;  Surgeon: Elam Dutch, MD;  Location: [place];  Service: Vascular;  Laterality: Right;    There were no vitals filed for this visit.      Subjective Assessment - 02/27/16 1109    Subjective Denies changes or falls.  Has dialysis today.   Pertinent History Seizure 12/2015; DVT, PE-has IVC filter in placed, DM, CKD-stage V on dialysis, sickle cell anemia, OSA, asthma, HTN, tachycardia in hospital (12/06/15), AVF    Patient Stated Goals Pt's goal for therapy is to get back to full range of motion and return to work.   Currently in Pain? No/denies       Bil LE press 60# x 15 reps x 2 sets;LLE only 40# x 15 reps x 2 sets;RLE only 40# x 15 reps x 2 sets.  Cues for control and to prevent hyperextension. Standing wall posture x 60 seconds x 2 with pillow behind head to avoid hyperextension. Standing corner balance exercises on pillow with eyes  closed, eyes closed and head nods/turns and feet together, eyes closed and head nods/turns. Standing on foam balance beam in parallel bars with intermittent UE support and head turns/nods.  Sidestepping along foam balance beam x 6' x 6 with UE support. Rocker board both directions with bil UE support progressing to 1 UE.  Able to perform very brief periods without UE assist but needs UE's to recover balance. Decrease balance reactions on head and hips during all activities.         PT Education - 02/26/16 1127    Education provided Yes   Education Details HEP   Person(s) Educated Patient   Methods Explanation;Demonstration;Handout;Verbal cues   Comprehension Verbalized understanding          PT Short Term Goals - 02/22/16 1243    PT SHORT TERM GOAL #1   Title Pt will  perform HEP independenlty for improved strength, balance and gait.  TARGET 03/23/16   Time 4   Period Weeks   Status New   PT SHORT TERM GOAL #2   Title Pt will improve Berg Score to at least 49/56 for decreased fall risk.   Time 4   Period Weeks   Status New   PT SHORT TERM GOAL #3   Title Pt will improve TUG score to less than or equal to 13.5 seconds for decreased fall risk.   Time 4   Period Weeks   Status New   PT SHORT TERM GOAL #4   Title Pt will amb. 300' over even/uneven terrain with LRAD at MOD I level.   Status New           PT Long Term Goals - 02/22/16 1245    PT LONG TERM GOAL #1   Title Pt will verbalize understanding of fall prevention in the home environment.  TARGET 04/22/16   Time 8   Period Weeks   Status New   PT LONG TERM GOAL #2   Title Pt will improve Dynamic Gait Index score to at least 19/24 for decreased fall risk.   Time 8   Period Weeks   Status New   PT LONG TERM GOAL #3   Title Pt will improve single limb stance to at least 3 seconds each leg for improved negotiation of steps and obstacles.   Time 8   Period Weeks   Status New   PT LONG TERM GOAL #4   Title Pt will amb. 600' over even/uneven terrain IND without AD to improve functional moblity.    Time 8   Period Weeks   Status New   PT LONG TERM GOAL #5   Title Pt will ascend/descent 12 steps with one handrail at MOD I level to negotiate steps at home.   Time 8   Period Weeks   Status New               Plan - 02/27/16 1313    Clinical Impression Statement Pt with decreased balance reactions with LOB/poor hip, ankle and trunk strategies.  Continue PT per POC.   Rehab Potential Good   Clinical Impairments Affecting Rehab Potential co-morbidities   PT Frequency 2x / week   PT Duration 8 weeks   PT Treatment/Interventions ADLs/Self Care Home Management;Therapeutic exercise;Therapeutic activities;Functional mobility training;Gait training;Balance training;Neuromuscular  re-education;Patient/family education   PT Next Visit Plan Hip and ankle strategies at counter or wall, compliant surface balance   Consulted and Agree with Plan of Care Patient      Patient  will benefit from skilled therapeutic intervention in order to improve the following deficits and impairments:  Abnormal gait, Decreased balance, Decreased mobility, Decreased strength, Difficulty walking, Postural dysfunction  Visit Diagnosis: Other abnormalities of gait and mobility  Muscle weakness (generalized)     Problem List Patient Active Problem List   Diagnosis Date Noted  . Acute encephalopathy 01/08/2016  . Seizures ([place]) 01/08/2016  . History of ischemic left MCA stroke 01/08/2016  . Right sided weakness 01/08/2016  . Chronic combined systolic and diastolic CHF (congestive heart failure) ([place]) 01/08/2016  . Atrial fibrillation ([place]) 01/08/2016  . Leukocytosis 01/08/2016  . Benign essential HTN 01/08/2016  . Anemia of chronic disease 01/08/2016  . Controlled type 2 diabetes mellitus with diabetic nephropathy, without long-term current use of insulin ([place]) 01/08/2016  . History of DVT (deep vein thrombosis) 01/08/2016  . ESRD (end stage renal disease) on dialysis [name])     Narda Bonds 02/27/2016, 1:15 PM  [facility] [address] [place], [place], [address] Phone: [phone]   Fax:  [phone]  Name: Shyam Dawson [facility] MRN: [id number] Date of Birth: 04/05/1966    Narda Bonds, [place] 02/27/2016 1:15 PM Phone: [phone] Fax: [phone]

## 2016-02-28 ENCOUNTER — Encounter: Payer: Self-pay | Admitting: Vascular Surgery

## 2016-02-28 ENCOUNTER — Ambulatory Visit (HOSPITAL_BASED_OUTPATIENT_CLINIC_OR_DEPARTMENT_OTHER): Payer: 59 | Admitting: Physical Medicine & Rehabilitation

## 2016-02-28 ENCOUNTER — Encounter: Payer: Self-pay | Admitting: Physical Medicine & Rehabilitation

## 2016-02-28 ENCOUNTER — Ambulatory Visit: Payer: Self-pay | Admitting: Physical Therapy

## 2016-02-28 ENCOUNTER — Encounter: Payer: BLUE CROSS/BLUE SHIELD | Attending: Physical Medicine & Rehabilitation

## 2016-02-28 VITALS — BP 119/80 | HR 65

## 2016-02-28 DIAGNOSIS — G8111 Spastic hemiplegia affecting right dominant side: Secondary | ICD-10-CM | POA: Diagnosis not present

## 2016-02-28 DIAGNOSIS — I120 Hypertensive chronic kidney disease with stage 5 chronic kidney disease or end stage renal disease: Secondary | ICD-10-CM | POA: Diagnosis not present

## 2016-02-28 DIAGNOSIS — R51 Headache: Secondary | ICD-10-CM | POA: Insufficient documentation

## 2016-02-28 DIAGNOSIS — R4701 Aphasia: Secondary | ICD-10-CM

## 2016-02-28 DIAGNOSIS — K859 Acute pancreatitis without necrosis or infection, unspecified: Secondary | ICD-10-CM | POA: Diagnosis not present

## 2016-02-28 DIAGNOSIS — M2041 Other hammer toe(s) (acquired), right foot: Secondary | ICD-10-CM | POA: Diagnosis not present

## 2016-02-28 DIAGNOSIS — I69351 Hemiplegia and hemiparesis following cerebral infarction affecting right dominant side: Secondary | ICD-10-CM

## 2016-02-28 DIAGNOSIS — Z86711 Personal history of pulmonary embolism: Secondary | ICD-10-CM | POA: Insufficient documentation

## 2016-02-28 DIAGNOSIS — I69920 Aphasia following unspecified cerebrovascular disease: Secondary | ICD-10-CM | POA: Diagnosis not present

## 2016-02-28 DIAGNOSIS — E875 Hyperkalemia: Secondary | ICD-10-CM | POA: Diagnosis not present

## 2016-02-28 DIAGNOSIS — Z8673 Personal history of transient ischemic attack (TIA), and cerebral infarction without residual deficits: Secondary | ICD-10-CM | POA: Insufficient documentation

## 2016-02-28 DIAGNOSIS — N179 Acute kidney failure, unspecified: Secondary | ICD-10-CM | POA: Diagnosis not present

## 2016-02-28 DIAGNOSIS — N185 Chronic kidney disease, stage 5: Secondary | ICD-10-CM | POA: Diagnosis not present

## 2016-02-28 DIAGNOSIS — D5740 Sickle-cell thalassemia without crisis: Secondary | ICD-10-CM | POA: Diagnosis not present

## 2016-02-28 DIAGNOSIS — Z9115 Patient's noncompliance with renal dialysis: Secondary | ICD-10-CM | POA: Diagnosis not present

## 2016-02-28 DIAGNOSIS — I639 Cerebral infarction, unspecified: Secondary | ICD-10-CM

## 2016-02-28 DIAGNOSIS — E1122 Type 2 diabetes mellitus with diabetic chronic kidney disease: Secondary | ICD-10-CM | POA: Insufficient documentation

## 2016-02-28 DIAGNOSIS — M2042 Other hammer toe(s) (acquired), left foot: Secondary | ICD-10-CM | POA: Diagnosis not present

## 2016-02-28 DIAGNOSIS — L600 Ingrowing nail: Secondary | ICD-10-CM | POA: Diagnosis not present

## 2016-02-28 DIAGNOSIS — IMO0002 Reserved for concepts with insufficient information to code with codable children: Secondary | ICD-10-CM | POA: Insufficient documentation

## 2016-02-28 DIAGNOSIS — Z992 Dependence on renal dialysis: Secondary | ICD-10-CM | POA: Diagnosis not present

## 2016-02-28 DIAGNOSIS — B351 Tinea unguium: Secondary | ICD-10-CM | POA: Diagnosis not present

## 2016-02-28 HISTORY — DX: Aphasia: R47.01

## 2016-02-28 HISTORY — DX: Cerebral infarction, unspecified: I63.9

## 2016-02-28 HISTORY — DX: Hemiplegia and hemiparesis following cerebral infarction affecting right dominant side: I69.351

## 2016-02-28 NOTE — Progress Notes (Signed)
Subjective:    Patient ID: Isaiah [name], male    DOB: 1966-02-27, 50 y.o.   MRN: [id number]  HPI 50 year old male with history of left MCA distribution infarct with right hemiparesis and aphasia as well as apraxia. Currently undergoing outpatient PT OT and speech. No cerebellar reviewed. Discussed when he is working on. Reviewed vascular surgery note, still not using AV graft in the right arm. Plan to start using it soon. Pain Inventory Average Pain 2 Pain Right Now 2 My pain is intermittent  In the last 24 hours, has pain interfered with the following? General activity 5 Relation with others 9 Enjoyment of life 8 What TIME of day is your pain at its worst? night Sleep (in general) Good  Pain is worse with: no pain Pain improves with: no pain Relief from Meds: 9  Mobility walk with assistance use a cane ability to climb steps?  yes do you drive?  no  Function disabled: date disabled . I need assistance with the following:  bathing and meal prep  Neuro/Psych No problems in this area  Prior Studies Any changes since last visit?  no  Physicians involved in your care Any changes since last visit?  no   Family History  Problem Relation Age of Onset  . Hypertension Mother   . Diabetes Mother   . Sickle cell anemia Brother   . Diabetes type I Brother   . Kidney disease Brother   . Stroke Father    Social History   Social History  . Marital Status: Married    Spouse Name: sylvia  . Number of Children: 1  . Years of Education: college   Occupational History  . [place] History Main Topics  . Smoking status: Never Smoker   . Smokeless tobacco: Never Used  . Alcohol Use: No  . Drug Use: No  . Sexual Activity: Yes   Other Topics Concern  . None   Social History Narrative   ** Merged History Encounter **       Past Surgical History  Procedure Laterality Date  . Cholecystectomy    . Bone marrow biopsy    . Other surgical  history      Retinal surgery  . Pars plana vitrectomy  02/17/2012    Procedure: PARS PLANA VITRECTOMY WITH 25 GAUGE;  Surgeon: Hayden Pedro, MD;  Location: [place];  Service: Ophthalmology;  Laterality: Right;  . Cholecystectomy  1990's?  . Eye surgery Right   . Insertion of dialysis catheter Right 10/16/2015    Procedure: INSERTION OF PALINDROME DIALYSIS CATHETER ;  Surgeon: Elam Dutch, MD;  Location: [place];  Service: Vascular;  Laterality: Right;  . Av fistula placement Right 12/05/2015    Procedure: INSERTION OF ARTERIOVENOUS (AV) GORE-TEX GRAFT ARM;  Surgeon: Elam Dutch, MD;  [facility];  Service: Vascular;  Laterality: Right;   Past Medical History  Diagnosis Date  . Diabetes mellitus without complication ([place])   . Sickle cell-thalassemia disease ([place])     a. Sickle cell trait.  . Pulmonary embolism ([place]) 07/2011    a. Tx with Coumadin for 6 months (unknown cause per patient).  . Hyperkalemia 07/2011  . Acute renal failure ([place]) 07/2011  . Monoclonal gammopathy   . Sleep apnea     a. uses CPAP.  [name] Hypertension   . Gout   . Pulmonary embolism ([place]) 07/2011; 09/27/2014    a. Bilat PE 07/2011 -  unclear cause, tx with 6 months Coumadin.;   . Monoclonal gammopathies   . Anemia   . Asthma   . OSA on CPAP   . History of recent blood transfusion 10/27/14    2 Units PRBC's  . ESRD (end stage renal disease) on dialysis ([place])   . Stroke ([place])   . A-fib ([facility])    BP 119/80 mmHg  Pulse 65  SpO2 98%  Opioid Risk Score:   Fall Risk Score:  `1  Depression screen PHQ 2/9  Depression screen [facility] 2/9 12/11/2015 11/16/2015 03/22/2015  Decreased Interest 0 0 0  Down, Depressed, Hopeless 0 0 0  PHQ - 2 Score 0 0 0  Altered sleeping - 0 -  Tired, decreased energy - 0 -  Change in appetite - 0 -  Feeling bad or failure about yourself  - 0 -  Trouble concentrating - 0 -  Moving slowly or fidgety/restless - 0 -  Suicidal thoughts - 0 -  PHQ-9 Score - 0 -     Review of  Systems  HENT: Negative.   Eyes: Negative.   Respiratory: Negative.   Cardiovascular: Negative.   Gastrointestinal: Negative.   Endocrine: Negative.   Genitourinary: Negative.   Musculoskeletal: Positive for gait problem.  Skin: Negative.   Allergic/Immunologic: Negative.   Neurological: Positive for weakness and numbness.  Hematological: Negative.   Psychiatric/Behavioral: Negative.        Objective:   Physical Exam  Constitutional: He is oriented to person, place, and time. He appears well-developed and well-nourished.  HENT:  Head: Normocephalic and atraumatic.  Eyes: Conjunctivae and EOM are normal. Pupils are equal, round, and reactive to light.  Neurological: He is alert and oriented to person, place, and time. No sensory deficit. He displays a negative Romberg sign. Coordination abnormal.  Psychiatric: He has a normal mood and affect.  Nursing note and vitals reviewed.   Names 2/3 objects Able to repeat no ifs ands or buts Both repeat [facility]  Motor strength is 4/5 in the right deltoid, biceps, triceps, 3 minus grip, hip flexor, knee extensor, ankle dorsal flexor 5/5 and left side Sensation is intact to light touch.  Decreased finger to thumb opposition cannot oppose the pinky to the thumb on the right hand.      Assessment & Plan:  1. Left MCA distribution infarct with right hemiparesis and aphasia. He has made good improvements with his functional status. He is now ambulating without an assisted device and he is now performing most of his ADLs. I do not think he is ready for driving yet. I do not think he is ready to return to work because of his aphasia as well as cognitive deficits related to his stroke. We discussed the time from her recovery from physical standpoint as well as cognitive/aphasia standpoint.  His prior job required high level communication skills. At this point I do not see this happening. He may be able to perform a simpler sedentary  position with further improvement over time. Because he has multiple medical issues including end-stage renal disease and  heart failure, May need several physician opinions on this.

## 2016-03-03 DIAGNOSIS — B351 Tinea unguium: Secondary | ICD-10-CM | POA: Insufficient documentation

## 2016-03-03 DIAGNOSIS — M2040 Other hammer toe(s) (acquired), unspecified foot: Secondary | ICD-10-CM

## 2016-03-03 DIAGNOSIS — L600 Ingrowing nail: Secondary | ICD-10-CM | POA: Insufficient documentation

## 2016-03-03 HISTORY — DX: Other hammer toe(s) (acquired), unspecified foot: M20.40

## 2016-03-03 HISTORY — DX: Tinea unguium: B35.1

## 2016-03-03 MED FILL — CONTOUR NEXT STRIPS: 33 days supply | Qty: 100 | Fill #1

## 2016-03-04 ENCOUNTER — Encounter: Payer: Self-pay | Admitting: Occupational Therapy

## 2016-03-04 ENCOUNTER — Ambulatory Visit: Payer: BLUE CROSS/BLUE SHIELD | Admitting: Occupational Therapy

## 2016-03-04 ENCOUNTER — Ambulatory Visit: Payer: BLUE CROSS/BLUE SHIELD

## 2016-03-04 DIAGNOSIS — M6281 Muscle weakness (generalized): Secondary | ICD-10-CM

## 2016-03-04 DIAGNOSIS — R41841 Cognitive communication deficit: Secondary | ICD-10-CM | POA: Diagnosis not present

## 2016-03-04 DIAGNOSIS — R278 Other lack of coordination: Secondary | ICD-10-CM

## 2016-03-04 DIAGNOSIS — R471 Dysarthria and anarthria: Secondary | ICD-10-CM

## 2016-03-04 DIAGNOSIS — I69351 Hemiplegia and hemiparesis following cerebral infarction affecting right dominant side: Secondary | ICD-10-CM | POA: Diagnosis not present

## 2016-03-04 DIAGNOSIS — R482 Apraxia: Secondary | ICD-10-CM

## 2016-03-04 DIAGNOSIS — I69315 Cognitive social or emotional deficit following cerebral infarction: Secondary | ICD-10-CM

## 2016-03-04 DIAGNOSIS — R279 Unspecified lack of coordination: Secondary | ICD-10-CM | POA: Diagnosis not present

## 2016-03-04 DIAGNOSIS — R2681 Unsteadiness on feet: Secondary | ICD-10-CM | POA: Diagnosis not present

## 2016-03-04 DIAGNOSIS — R2689 Other abnormalities of gait and mobility: Secondary | ICD-10-CM | POA: Diagnosis not present

## 2016-03-04 NOTE — Patient Instructions (Signed)
  Please complete the assigned speech therapy homework and return it to your next session.

## 2016-03-04 NOTE — Therapy (Signed)
[facility] [address] [place], [place], [address] Phone: [phone]   Fax:  [phone]  Occupational Therapy Treatment  Patient Details  Name: Isaiah [name] [name] MRN: [id number] Date of Birth: 1965-11-22 Referring Provider: Alysia Penna  Encounter Date: 03/04/2016      OT End of Session - 03/04/16 1500    Visit Number 3   Number of Visits 17   Date for OT Re-Evaluation 05/01/16   Authorization Type medicare   Authorization Time Period "$20 co pay, no visit limit, no auth required"   Authorization - Visit Number 3   Authorization - Number of Visits 10   OT Start Time 1402   OT Stop Time 1445   OT Time Calculation (min) 43 min   Activity Tolerance Patient tolerated treatment well      Past Medical History  Diagnosis Date  . Diabetes mellitus without complication ([place])   . Sickle cell-thalassemia disease ([place])     a. Sickle cell trait.  . Pulmonary embolism ([place]) 07/2011    a. Tx with Coumadin for 6 months (unknown cause per patient).  . Hyperkalemia 07/2011  . Acute renal failure ([place]) 07/2011  . Monoclonal gammopathy   . Sleep apnea     a. uses CPAP.  [name] Hypertension   . Gout   . Pulmonary embolism ([place]) 07/2011; 09/27/2014    a. Bilat PE 07/2011 - unclear cause, tx with 6 months Coumadin.;   . Monoclonal gammopathies   . Anemia   . Asthma   . OSA on CPAP   . History of recent blood transfusion 10/27/14    2 Units PRBC's  . ESRD (end stage renal disease) on dialysis ([place])   . Stroke ([place])   . A-fib [facility])     Past Surgical History  Procedure Laterality Date  . Cholecystectomy    . Bone marrow biopsy    . Other surgical history      Retinal surgery  . Pars plana vitrectomy  02/17/2012    Procedure: PARS PLANA VITRECTOMY WITH 25 GAUGE;  Surgeon: Hayden Pedro, MD;  Location: [place];  Service: Ophthalmology;  Laterality: Right;  . Cholecystectomy  1990's?  . Eye surgery Right   . Insertion of  dialysis catheter Right 10/16/2015    Procedure: INSERTION OF PALINDROME DIALYSIS CATHETER ;  Surgeon: Elam Dutch, MD;  Location: [place];  Service: Vascular;  Laterality: Right;  . Av fistula placement Right 12/05/2015    Procedure: INSERTION OF ARTERIOVENOUS (AV) GORE-TEX GRAFT ARM;  Surgeon: Elam Dutch, MD;  Location: [place];  Service: Vascular;  Laterality: Right;    There were no vitals filed for this visit.      Subjective Assessment - 03/04/16 1408    Subjective  I am eating about half my meal with my right hand   Patient is accompained by: Family member  pt's mom   Pertinent History see Epic    Patient Stated Goals to use right hand better   Currently in Pain? No/denies                      OT Treatments/Exercises (OP) - 03/04/16 0001    ADLs   LB Dressing Demonstrated shoe buttons as pt is able to all of his own dressing except tie his shoes.  Pt able to return demonstate use and provided info on where to obtain.  Also addressed bathing - wife is assisting with washing his  back. Demonstated use of LH sponge and pt able to verbalize understanding. Also issued red foam for use with toothbrush.  Pt stated "I am so glad because now I can truly do all of this myself"   Neurological Re-education Exercises   Other Exercises 1 Neuro re ed to address overhead reach with functional hand activity requiring 2 point pinch and in hand manipulation. Pt able to complete activity with one pound weight on wrist - pt required rest break due to shoulder fatigue.     Manual Therapy   Manual Therapy Joint mobilization;Edema management   Manual therapy comments Pt with some residual edema and stiffness from decreased use - aggresive edema massage and joint mob with good results - pt able to complete 95% of composite flexion - followed by neuro re ed.                   OT Short Term Goals - 03/04/16 1457    OT SHORT TERM GOAL #1   Title I with initiall HEP- 03/20/2016    Time 4   Period Weeks   Status On-going   OT SHORT TERM GOAL #2   Title Pt will perform all basic ADLS with supervision.   Time 4   Period Weeks   Status Achieved   OT SHORT TERM GOAL #3   Title Patient will demonstrate improved functional strength in right UE as evidenced by brushing teeth with dominant right hand   Time 4   Period Weeks   Status On-going   OT SHORT TERM GOAL #4   Title Pt will demonstrate ability to retrieve a light weight (less than 1 lb) object at 120 shoulder flexion with RUE.   Time 4   Period Weeks   Status On-going   OT SHORT TERM GOAL #5   Title Patient will demostrate improved coordination for right hand as evidenced by 15 on box and blocks test   Time 4   Period Weeks   Status On-going   OT SHORT TERM GOAL #6   Title Pt will report feeding himself with RUE 75% of the time.   Time 4   Period Weeks   Status On-going           OT Long Term Goals - 03/04/16 1458    OT LONG TERM GOAL #1   Title I with updated HEP.- 04/17/2016   Time 8   Period Weeks   Status On-going   OT LONG TERM GOAL #2   Title Pt will demonstrate ability to write his name legibily.   Time 8   Period Weeks   Status On-going   OT LONG TERM GOAL #3   Title Patient will demonstrate adequate strength and coordination in dominant right hand to tighten laces and tie shoes using bilateral technique   Time 8   Period Weeks   Status On-going   OT LONG TERM GOAL #4   Title Assess 9 hole peg test and determine goal   Status On-going   OT LONG TERM GOAL #5   Title Pt will demonstrate RUE grip strength of 20+ lbs for increased functional use.   Time 8   Period Weeks   Status On-going   OT LONG TERM GOAL #6   Title Pt will perform all basic ADLs and simple meal prep modified independently.   Time 8   Period Weeks   Status On-going  Plan - 03/04/16 1459    Clinical Impression Statement Pt progressing toward goals. Pt with improved isolated movement in  RUE and overhead reach   Rehab Potential Good   OT Frequency 2x / week   OT Duration 8 weeks   OT Treatment/Interventions Self-care/ADL training;Therapeutic exercise;Patient/family education;Balance training;Splinting;Manual Therapy;Neuromuscular education;Ultrasound;Therapeutic exercises;Therapeutic activities;DME and/or AE instruction;Parrafin;Cryotherapy;Electrical Stimulation;Fluidtherapy;Scar mobilization;Cognitive remediation/compensation;Moist Heat;Contrast Bath;Passive range of motion;Visual/perceptual remediation/compensation;Functional Mobility Training   Plan NMR RUE, strengthening and functional use of R hand   OT Home Exercise Plan Education issued:  12/31/15 initial HEP (coordination, cane ex);  added putty on 02/26/2016   Consulted and Agree with Plan of Care Patient      Patient will benefit from skilled therapeutic intervention in order to improve the following deficits and impairments:  Cardiopulmonary status limiting activity, Decreased activity tolerance, Decreased balance, Decreased cognition, Decreased endurance, Decreased coordination, Decreased strength, Increased edema, Impaired tone, Impaired UE functional use, Impaired vision/preception  Visit Diagnosis: Cognitive social or emotional deficit following cerebral infarction  Muscle weakness (generalized)  Hemiplegia and hemiparesis following cerebral infarction affecting right dominant side ([place])  Other lack of coordination    Problem List Patient Active Problem List   Diagnosis Date Noted  . Hemiparesis affecting right side as late effect of cerebrovascular accident ([place]) 02/28/2016  . Aphasia, late effect of cerebrovascular disease 02/28/2016  . Acute encephalopathy 01/08/2016  . Seizures ([place]) 01/08/2016  . History of ischemic left MCA stroke 01/08/2016  . Right sided weakness 01/08/2016  . Chronic combined systolic and diastolic CHF (congestive heart failure) ([place]) 01/08/2016  . Atrial fibrillation ([place])  01/08/2016  . Leukocytosis 01/08/2016  . Benign essential HTN 01/08/2016  . Anemia of chronic disease 01/08/2016  . Controlled type 2 diabetes mellitus with diabetic nephropathy, without long-term current use of insulin ([place]) 01/08/2016  . History of DVT (deep vein thrombosis) 01/08/2016  . ESRD (end stage renal disease) on dialysis [facility])     Quay Burow, OTR/L 03/04/2016, 3:02 PM  [facility] [address] [place], [place], [address] Phone: [phone]   Fax:  [phone]  Name: Marc Leichter [name] MRN: [id number] Date of Birth: July 21, 1966

## 2016-03-04 NOTE — Therapy (Signed)
[facility] [address] [place], [place], [address] Phone: [phone]   Fax:  [phone]  Speech Language Pathology Treatment  Patient Details  Name: Isaiah [name] [facility] MRN: [id number] Date of Birth: [birth year] Referring Provider: Alysia Penna, MD  Encounter Date: 03/04/2016      End of Session - 03/04/16 1630    Visit Number 5   Number of Visits 20   Date for SLP Re-Evaluation 05/02/16   SLP Start Time 1535   SLP Stop Time  63   SLP Time Calculation (min) 40 min   Activity Tolerance Patient tolerated treatment well      Past Medical History  Diagnosis Date  . Diabetes mellitus without complication ([place])   . Sickle cell-thalassemia disease ([place])     a. Sickle cell trait.  . Pulmonary embolism ([place]) 07/2011    a. Tx with Coumadin for 6 months (unknown cause per patient).  . Hyperkalemia 07/2011  . Acute renal failure ([place]) 07/2011  . Monoclonal gammopathy   . Sleep apnea     a. uses CPAP.  [name] Hypertension   . Gout   . Pulmonary embolism ([place]) 07/2011; 09/27/2014    a. Bilat PE 07/2011 - unclear cause, tx with 6 months Coumadin.;   . Monoclonal gammopathies   . Anemia   . Asthma   . OSA on CPAP   . History of recent blood transfusion 10/27/14    2 Units PRBC's  . ESRD (end stage renal disease) on dialysis ([place])   . Stroke ([place])   . A-fib [facility])     Past Surgical History  Procedure Laterality Date  . Cholecystectomy    . Bone marrow biopsy    . Other surgical history      Retinal surgery  . Pars plana vitrectomy  02/17/2012    Procedure: PARS PLANA VITRECTOMY WITH 25 GAUGE;  Surgeon: Hayden Pedro, MD;  Location: [place];  Service: Ophthalmology;  Laterality: Right;  . Cholecystectomy  1990's?  . Eye surgery Right   . Insertion of dialysis catheter Right 10/16/2015    Procedure: INSERTION OF PALINDROME DIALYSIS CATHETER ;  Surgeon: Elam Dutch, MD;  Location: [place];  Service: Vascular;   Laterality: Right;  . Av fistula placement Right 12/05/2015    Procedure: INSERTION OF ARTERIOVENOUS (AV) GORE-TEX GRAFT ARM;  Surgeon: Elam Dutch, MD;  Location: [place];  Service: Vascular;  Laterality: Right;    There were no vitals filed for this visit.             ADULT SLP TREATMENT - 03/04/16 1626    General Information   Behavior/Cognition Alert;Cooperative;Pleasant mood   Pain Assessment   Pain Assessment No/denies pain   Cognitive-Linquistic Treatment   Treatment focused on Apraxia   Skilled Treatment SLP facilitated pt's practice with apraxia by repetition or conversational phrases with 75% success, "oh", "eye" and "er" vowels were problematic, but improved with SLP verbal and visual cues with reduced rate and repetition x3-5 at that slower rate. Pt repeated 2-3 syllable words with those same problem vowels, 60% success on first attempt, incr'd to 80% with multiple attemtps and SLP mod cues.   Assessment / Recommendations / Plan   Plan Continue with current plan of care   Progression Toward Goals   Progression toward goals Progressing toward goals            SLP Short Term Goals - 03/04/16 1631    SLP SHORT TERM  GOAL #1   Title pt will demo HEP for apraxia/motor plannning with independence over 3 sessions   Baseline began new POC 02-21-16   Time 4   Period Weeks  or 8 visits, for all STGs   Status On-going   SLP SHORT TERM GOAL #2   Title pt will demo HEP for dysarthria with independence over 3 sessions   Time 4   Period Weeks   Status On-going   SLP SHORT TERM GOAL #3   Title pt will demo speech 95% accurate in sentence-length responses with modified independence (compensations) over 3 sessions   Time 4   Period Weeks   Status On-going          SLP Long Term Goals - 03/04/16 1631    SLP LONG TERM GOAL #1   Title pt will demo functional speech in 20 minutes mod complex/complex conversation with modified independnence   Baseline new POC on  02-21-16   Time 8   Period Weeks  or 16 visits, for all LTGs   Status On-going   SLP LONG TERM GOAL #2   Title pt will demo HEP for speech-motor planning indpendently over 5 total sessions    Time 8   Period Weeks   Status On-going   SLP LONG TERM GOAL #3   Title pt will demo HEP for dysarthria independently over 5 total sessions   Time 8   Period Weeks   Status On-going          Plan - 03/04/16 1630    Clinical Impression Statement Pt  present with motor speech deficit, specifically mod apraxia, and needs his speech to improve for him to return to work, and to improve his QOL overall. Cognitive abilities will be screened ASAP. Pt is motivated for change.  Skilled ST would cont to benefit pt for incr'd independence and possible return to work.   Speech Therapy Frequency 2x / week   Duration --  8 weeks, or 16 sessions   Treatment/Interventions SLP instruction and feedback;Compensatory strategies;Internal/external aids;Oral motor exercises;Patient/family education;Functional tasks;Cueing hierarchy   Potential to Achieve Goals Good   Potential Considerations Severity of impairments   Consulted and Agree with Plan of Care Patient      Patient will benefit from skilled therapeutic intervention in order to improve the following deficits and impairments:   Apraxia  Dysarthria and anarthria  Cognitive communication deficit    Problem List Patient Active Problem List   Diagnosis Date Noted  . Hemiparesis affecting right side as late effect of cerebrovascular accident ([place]) 02/28/2016  . Aphasia, late effect of cerebrovascular disease 02/28/2016  . Acute encephalopathy 01/08/2016  . Seizures ([place]) 01/08/2016  . History of ischemic left MCA stroke 01/08/2016  . Right sided weakness 01/08/2016  . Chronic combined systolic and diastolic CHF (congestive heart failure) ([place]) 01/08/2016  . Atrial fibrillation ([place]) 01/08/2016  . Leukocytosis 01/08/2016  . Benign essential HTN  01/08/2016  . Anemia of chronic disease 01/08/2016  . Controlled type 2 diabetes mellitus with diabetic nephropathy, without long-term current use of insulin ([place]) 01/08/2016  . History of DVT (deep vein thrombosis) 01/08/2016  . ESRD (end stage renal disease) on dialysis [facility])     [place] ,[place], CCC-SLP  03/04/2016, 4:32 PM  [facility] [address] [place], [place], [address] Phone: [phone]   Fax:  [phone]   Name: Jasson Siegmann [facility] MRN: [id number] Date of Birth: 1965-11-21

## 2016-03-06 ENCOUNTER — Ambulatory Visit (HOSPITAL_COMMUNITY)
Admission: RE | Admit: 2016-03-06 | Discharge: 2016-03-06 | Disposition: A | Discharge status: Home / Self Care | Payer: BLUE CROSS/BLUE SHIELD | Source: Ambulatory Visit | Attending: Vascular Surgery | Admitting: Vascular Surgery

## 2016-03-06 ENCOUNTER — Ambulatory Visit: Payer: BLUE CROSS/BLUE SHIELD | Admitting: Physical Therapy

## 2016-03-06 ENCOUNTER — Encounter: Payer: Self-pay | Admitting: Physical Therapy

## 2016-03-06 ENCOUNTER — Ambulatory Visit (INDEPENDENT_AMBULATORY_CARE_PROVIDER_SITE_OTHER): Payer: BLUE CROSS/BLUE SHIELD | Admitting: Vascular Surgery

## 2016-03-06 ENCOUNTER — Encounter: Payer: Self-pay | Admitting: Vascular Surgery

## 2016-03-06 VITALS — BP 119/75 | HR 64 | Temp 97.5°F | Resp 14 | Ht 70.0 in | Wt 211.0 lb

## 2016-03-06 DIAGNOSIS — I120 Hypertensive chronic kidney disease with stage 5 chronic kidney disease or end stage renal disease: Secondary | ICD-10-CM | POA: Diagnosis not present

## 2016-03-06 DIAGNOSIS — E1122 Type 2 diabetes mellitus with diabetic chronic kidney disease: Secondary | ICD-10-CM | POA: Insufficient documentation

## 2016-03-06 DIAGNOSIS — I69351 Hemiplegia and hemiparesis following cerebral infarction affecting right dominant side: Secondary | ICD-10-CM

## 2016-03-06 DIAGNOSIS — R482 Apraxia: Secondary | ICD-10-CM

## 2016-03-06 DIAGNOSIS — Z992 Dependence on renal dialysis: Secondary | ICD-10-CM | POA: Diagnosis not present

## 2016-03-06 DIAGNOSIS — R279 Unspecified lack of coordination: Secondary | ICD-10-CM | POA: Diagnosis not present

## 2016-03-06 DIAGNOSIS — M6281 Muscle weakness (generalized): Secondary | ICD-10-CM

## 2016-03-06 DIAGNOSIS — R278 Other lack of coordination: Secondary | ICD-10-CM | POA: Diagnosis not present

## 2016-03-06 DIAGNOSIS — N186 End stage renal disease: Secondary | ICD-10-CM

## 2016-03-06 DIAGNOSIS — R2689 Other abnormalities of gait and mobility: Secondary | ICD-10-CM

## 2016-03-06 DIAGNOSIS — I639 Cerebral infarction, unspecified: Secondary | ICD-10-CM

## 2016-03-06 DIAGNOSIS — G4733 Obstructive sleep apnea (adult) (pediatric): Secondary | ICD-10-CM | POA: Diagnosis not present

## 2016-03-06 DIAGNOSIS — R2681 Unsteadiness on feet: Secondary | ICD-10-CM

## 2016-03-06 DIAGNOSIS — R41841 Cognitive communication deficit: Secondary | ICD-10-CM | POA: Diagnosis not present

## 2016-03-06 DIAGNOSIS — R471 Dysarthria and anarthria: Secondary | ICD-10-CM | POA: Diagnosis not present

## 2016-03-06 MED FILL — LIDOCAINE-PRILOCAINE CREAM: 2.5-2.5 | 30 days supply | Qty: 60 | Fill #0

## 2016-03-06 NOTE — Progress Notes (Signed)
Vascular and Vein Specialist of [facility]  Patient name: Isaiah [name] [facility] MRN: [id number] DOB: 05-14-1966 Sex: male  REASON FOR VISIT: Follow-up  HPI: Isaiah [name] is a 50 y.o. male who is here for continued follow-up of his right radial cephalic AV fistula placed on 12/05/2015. He is currently dialyzing on Mondays, Wednesdays and Fridays via a left IJ tunneled dialysis catheter. He was last seen in the office on 01/17/2016. At that time his fistula was just under 5 mm. He was advised to follow-up in 6 weeks.  The patient recently suffered a stroke and has been attending outpatient rehabilitation. He is continuing to improve. He is on Coumadin for atrial fibrillation. He has required bridging in the past for surgery.  Past Medical History  Diagnosis Date  . Diabetes mellitus without complication ([place])   . Sickle cell-thalassemia disease ([place])     a. Sickle cell trait.  . Pulmonary embolism ([place]) 07/2011    a. Tx with Coumadin for 6 months (unknown cause per patient).  . Hyperkalemia 07/2011  . Acute renal failure ([place]) 07/2011  . Monoclonal gammopathy   . Sleep apnea     a. uses CPAP.  [name] Hypertension   . Gout   . Pulmonary embolism ([place]) 07/2011; 09/27/2014    a. Bilat PE 07/2011 - unclear cause, tx with 6 months Coumadin.;   . Monoclonal gammopathies   . Anemia   . Asthma   . OSA on CPAP   . History of recent blood transfusion 10/27/14    2 Units PRBC's  . ESRD (end stage renal disease) on dialysis ([place])   . Stroke ([place])   . A-fib [facility])     Family History  Problem Relation Age of Onset  . Hypertension Mother   . Diabetes Mother   . Sickle cell anemia Brother   . Diabetes type I Brother   . Kidney disease Brother   . Stroke Father     SOCIAL HISTORY: Social History  Substance Use Topics  . Smoking status: Never Smoker   . Smokeless tobacco: Never Used  . Alcohol Use: No    Allergies  Allergen Reactions  . Codeine Rash and Other (See Comments)    Unknown  reaction (patient says it was more serious than just a rash, but he can't remember what happened) Unknown reaction (patient says it was more serious than just a rash, but he can't remember what happened)    Current Outpatient Prescriptions  Medication Sig Dispense Refill  . aspirin EC 81 MG tablet Take 1 tablet (81 mg total) by mouth daily. 100 tablet 0  . epoetin alfa (EPOGEN,PROCRIT) 32671 UNIT/ML injection Inject 5,000 Units into the skin once a week. Reported on 01/17/2016    . fluticasone furoate-vilanterol (BREO ELLIPTA) 100-25 MCG/INH AEPB Inhale 1 puff into the lungs daily. 60 each 1  . Insulin Glargine (LANTUS) 100 UNIT/ML Solostar Pen Inject 10 Units into the skin daily at 10 pm. 15 mL 1  . Lacosamide (VIMPAT) 100 MG TABS Take 1 tablet (100 mg total) by mouth 2 (two) times daily. 28 tablet 0  . lacosamide 100 MG TABS Take 1 tablet (100 mg total) by mouth 2 (two) times daily. 60 tablet 0  . metoprolol tartrate (LOPRESSOR) 25 MG tablet Take 0.5 tablets (12.5 mg total) by mouth 2 (two) times daily. 60 tablet 0  . midodrine (PROAMATINE) 2.5 MG tablet Take 1 tablet (2.5 mg total) by mouth 2 (two) times daily with a meal. 60  tablet 1  . multivitamin (RENA-VIT) TABS tablet Take 1 tablet by mouth at bedtime. 30 tablet 1  . NEEDLE, DISP, 27 G (BD DISP NEEDLES) 27G X 1/2" MISC 1 application by Does not apply route daily after supper. 100 each 0  . pantoprazole (PROTONIX) 40 MG tablet Take 1 tablet (40 mg total) by mouth 2 (two) times daily. 60 tablet 1  . SENSIPAR 60 MG tablet Take 60 mg by mouth daily.  10  . warfarin (COUMADIN) 5 MG tablet Take 1.5 tablets (7.5 mg total) by mouth daily at 6 PM. 45 tablet 1   No current facility-administered medications for this visit.    REVIEW OF SYSTEMS:  [X]  denotes positive finding, [ ]  denotes negative finding Cardiac  Comments:  Chest pain or chest pressure:    Shortness of breath upon exertion:    Short of breath when lying flat:    Irregular  heart rhythm:        Vascular    Pain in calf, thigh, or hip brought on by ambulation:    Pain in feet at night that wakes you up from your sleep:     Blood clot in your veins:    Leg swelling:         Pulmonary    Oxygen at home:    Productive cough:     Wheezing:         Neurologic    Sudden weakness in arms or legs:     Sudden numbness in arms or legs:     Sudden onset of difficulty speaking or slurred speech:    Temporary loss of vision in one eye:     Problems with dizziness:         Gastrointestinal    Blood in stool:     Vomited blood:         Genitourinary    Burning when urinating:     Blood in urine:        Psychiatric    Major depression:         Hematologic    Bleeding problems:    Problems with blood clotting too easily:        Skin    Rashes or ulcers:        Constitutional    Fever or chills:      PHYSICAL EXAM: Filed Vitals:   03/06/16 1607  BP: 119/75  Pulse: 64  Temp: 97.5 F (36.4 C)  Resp: 14  Height: 5' 10"  (1.778 m)  Weight: 211 lb (95.709 kg)  SpO2: 99%    GENERAL: The patient is a well-nourished male, in no acute distress. The vital signs are documented above. VASCULAR: Palpable thrill right forearm fistula. Right arm incision well healed. PULMONARY: Nonlabored respiratory effort. NEUROLOGIC: Some speech stuttering. PSYCHIATRIC: The patient has a normal affect.  DATA:  Dialysis fistula duplex 03/06/2016  Fistula is 0.56 cm at the largest diameter. Depth is less then 4 mm. Area of narrowing distally.  MEDICAL ISSUES:  ESRD on HD   Patient is currently dialyzing via a left IJ tunneled dialysis catheter on Mondays, Wednesdays and Fridays. His right forearm fistula has not yet matured and is just under 6 cm. There is an area of narrowing distally. This will need to be evaluated with a fistulogram with possible intervention. He is on Coumadin for atrial fibrillation. He will need to be bridged with Lovenox. This will be scheduled  for 03/20/2016 with Dr. Bridgett Larsson.  Virgina Jock,  PA-C [facility] [place]   History and exam details as above. Fistula still slightly small. I believe this point he needs a fistulogram for further evaluation of this. We will need to bridge his Coumadin due to high risk of stroke.  Ruta Hinds, MD [facility] [place] Office: [phone] Pager: [phone]

## 2016-03-06 NOTE — Therapy (Signed)
[facility] [address] [place], [place], [address] Phone: [phone]   Fax:  [phone]  Physical Therapy Treatment  Patient Details  Name: Isaiah [name] [facility] MRN: [id number] Date of Birth: 1966-08-05 Referring Provider: Dr. Letta Pate  Encounter Date: 03/06/2016      PT End of Session - 03/06/16 1149    Visit Number 4   Number of Visits 17   Date for PT Re-Evaluation 02/12/16   Authorization Type BCBS   PT Start Time 1105   PT Stop Time 1145   PT Time Calculation (min) 40 min   Activity Tolerance Patient tolerated treatment well      Past Medical History  Diagnosis Date  . Diabetes mellitus without complication ([place])   . Sickle cell-thalassemia disease ([place])     a. Sickle cell trait.  . Pulmonary embolism ([place]) 07/2011    a. Tx with Coumadin for 6 months (unknown cause per patient).  . Hyperkalemia 07/2011  . Acute renal failure ([place]) 07/2011  . Monoclonal gammopathy   . Sleep apnea     a. uses CPAP.  [name] Hypertension   . Gout   . Pulmonary embolism ([place]) 07/2011; 09/27/2014    a. Bilat PE 07/2011 - unclear cause, tx with 6 months Coumadin.;   . Monoclonal gammopathies   . Anemia   . Asthma   . OSA on CPAP   . History of recent blood transfusion 10/27/14    2 Units PRBC's  . ESRD (end stage renal disease) on dialysis ([place])   . Stroke ([place])   . A-fib [facility])     Past Surgical History  Procedure Laterality Date  . Cholecystectomy    . Bone marrow biopsy    . Other surgical history      Retinal surgery  . Pars plana vitrectomy  02/17/2012    Procedure: PARS PLANA VITRECTOMY WITH 25 GAUGE;  Surgeon: Hayden Pedro, MD;  Location: [facility];  Service: Ophthalmology;  Laterality: Right;  . Cholecystectomy  1990's?  . Eye surgery Right   . Insertion of dialysis catheter Right 10/16/2015    Procedure: INSERTION OF PALINDROME DIALYSIS CATHETER ;  Surgeon: Elam Dutch, MD;  Location: [facility];  Service:  Vascular;  Laterality: Right;  . Av fistula placement Right 12/05/2015    Procedure: INSERTION OF ARTERIOVENOUS (AV) GORE-TEX GRAFT ARM;  Surgeon: Elam Dutch, MD;  Location: [facility];  Service: Vascular;  Laterality: Right;    There were no vitals filed for this visit.      Subjective Assessment - 03/06/16 1107    Subjective Reports continuing HEP performance at least 1x/day   Currently in Pain? No/denies                              Balance Exercises - 03/06/16 1117    Balance Exercises: Standing   Standing Eyes Opened Solid surface;Narrow base of support (BOS);Cognitive challenge  stepping forward, landing on target with feet close together; Min guard.   Standing Eyes Closed Narrow base of support (BOS);Foam/compliant surface;20 secs  with head movements, intermittent UE support   Wall Bumps Hip /ankle strategies solid surfaace to compliant surface cues for body mechanics.   Step Ups 6 inch  10x each with intermittent UE support   Tandem Gait Forward;Intermittent upper extremity support;5 reps intermittant UE support and min guard   Sidestepping Upper extremity support;5 reps  intermittent UE support- crossovers  forward and backwards           PT Education - 03/06/16 1138    Education provided Yes   Education Details Wall bumps/ ankle hip strategies.   Person(s) Educated Patient   Methods Explanation;Demonstration;Tactile cues;Verbal cues   Comprehension Verbalized understanding;Returned demonstration;Verbal cues required;Tactile cues required;Need further instruction          PT Short Term Goals - 02/22/16 1243    PT SHORT TERM GOAL #1   Title Pt will perform HEP independenlty for improved strength, balance and gait.  TARGET 03/23/16   Time 4   Period Weeks   Status New   PT SHORT TERM GOAL #2   Title Pt will improve Berg Score to at least 49/56 for decreased fall risk.   Time 4   Period Weeks   Status New   PT SHORT TERM GOAL #3    Title Pt will improve TUG score to less than or equal to 13.5 seconds for decreased fall risk.   Time 4   Period Weeks   Status New   PT SHORT TERM GOAL #4   Title Pt will amb. 300' over even/uneven terrain with LRAD at MOD I level.   Status New           PT Long Term Goals - 02/22/16 1245    PT LONG TERM GOAL #1   Title Pt will verbalize understanding of fall prevention in the home environment.  TARGET 04/22/16   Time 8   Period Weeks   Status New   PT LONG TERM GOAL #2   Title Pt will improve Dynamic Gait Index score to at least 19/24 for decreased fall risk.   Time 8   Period Weeks   Status New   PT LONG TERM GOAL #3   Title Pt will improve single limb stance to at least 3 seconds each leg for improved negotiation of steps and obstacles.   Time 8   Period Weeks   Status New   PT LONG TERM GOAL #4   Title Pt will amb. 600' over even/uneven terrain IND without AD to improve functional moblity.    Time 8   Period Weeks   Status New   PT LONG TERM GOAL #5   Title Pt will ascend/descent 12 steps with one handrail at MOD I level to negotiate steps at home.   Time 8   Period Weeks   Status New               Plan - 03/06/16 1150    Clinical Impression Statement Decreased ankle/hip strategies but demonstrated some carryover of technique during session from solid surface to compliant surface with verbal and manual cues.   Rehab Potential Good   Clinical Impairments Affecting Rehab Potential co-morbidities   PT Frequency 2x / week   PT Duration 8 weeks   PT Treatment/Interventions ADLs/Self Care Home Management;Therapeutic exercise;Therapeutic activities;Functional mobility training;Gait training;Balance training;Neuromuscular re-education;Patient/family education   PT Next Visit Plan Hip and ankle strategies at counter or wall, compliant surface balance   Consulted and Agree with Plan of Care Patient      Patient will benefit from skilled therapeutic intervention  in order to improve the following deficits and impairments:  Abnormal gait, Decreased balance, Decreased mobility, Decreased strength, Difficulty walking, Postural dysfunction  Visit Diagnosis: Apraxia  Muscle weakness (generalized)  Hemiplegia and hemiparesis following cerebral infarction affecting right dominant side (HCC)  Other lack of coordination  Other abnormalities of gait  and mobility  Unsteadiness on feet     Problem List Patient Active Problem List   Diagnosis Date Noted  . Hemiparesis affecting right side as late effect of cerebrovascular accident ([place]) 02/28/2016  . Aphasia, late effect of cerebrovascular disease 02/28/2016  . Acute encephalopathy 01/08/2016  . Seizures ([place]) 01/08/2016  . History of ischemic left MCA stroke 01/08/2016  . Right sided weakness 01/08/2016  . Chronic combined systolic and diastolic CHF (congestive heart failure) ([place]) 01/08/2016  . Atrial fibrillation ([place]) 01/08/2016  . Leukocytosis 01/08/2016  . Benign essential HTN 01/08/2016  . Anemia of chronic disease 01/08/2016  . Controlled type 2 diabetes mellitus with diabetic nephropathy, without long-term current use of insulin ([place]) 01/08/2016  . History of DVT (deep vein thrombosis) 01/08/2016  . ESRD (end stage renal disease) on dialysis [facility])     Bjorn Loser, PTA  03/06/2016, 12:17 PM [facility] [address] [place], [place], [address] Phone: [phone]   Fax:  [phone]  Name: Benyamin Jeff [facility] MRN: [id number] Date of Birth: 08-19-1966

## 2016-03-07 ENCOUNTER — Other Ambulatory Visit: Payer: Self-pay

## 2016-03-10 DIAGNOSIS — D509 Iron deficiency anemia, unspecified: Secondary | ICD-10-CM | POA: Diagnosis not present

## 2016-03-10 DIAGNOSIS — T8249XA Other complication of vascular dialysis catheter, initial encounter: Secondary | ICD-10-CM | POA: Diagnosis not present

## 2016-03-10 DIAGNOSIS — N186 End stage renal disease: Secondary | ICD-10-CM | POA: Diagnosis not present

## 2016-03-10 DIAGNOSIS — D688 Other specified coagulation defects: Secondary | ICD-10-CM | POA: Diagnosis not present

## 2016-03-10 DIAGNOSIS — E1129 Type 2 diabetes mellitus with other diabetic kidney complication: Secondary | ICD-10-CM | POA: Diagnosis not present

## 2016-03-12 ENCOUNTER — Telehealth: Payer: Self-pay | Admitting: Cardiovascular Disease

## 2016-03-12 ENCOUNTER — Ambulatory Visit: Payer: 59 | Admitting: Occupational Therapy

## 2016-03-12 ENCOUNTER — Telehealth: Payer: Self-pay

## 2016-03-12 ENCOUNTER — Encounter: Payer: Self-pay | Admitting: Occupational Therapy

## 2016-03-12 ENCOUNTER — Ambulatory Visit: Payer: 59 | Attending: Physical Medicine & Rehabilitation | Admitting: Physical Therapy

## 2016-03-12 DIAGNOSIS — R2681 Unsteadiness on feet: Secondary | ICD-10-CM | POA: Diagnosis not present

## 2016-03-12 DIAGNOSIS — Z86718 Personal history of other venous thrombosis and embolism: Secondary | ICD-10-CM | POA: Insufficient documentation

## 2016-03-12 DIAGNOSIS — R279 Unspecified lack of coordination: Secondary | ICD-10-CM | POA: Diagnosis not present

## 2016-03-12 DIAGNOSIS — R482 Apraxia: Secondary | ICD-10-CM | POA: Diagnosis not present

## 2016-03-12 DIAGNOSIS — R41841 Cognitive communication deficit: Secondary | ICD-10-CM | POA: Insufficient documentation

## 2016-03-12 DIAGNOSIS — T8249XA Other complication of vascular dialysis catheter, initial encounter: Secondary | ICD-10-CM | POA: Diagnosis not present

## 2016-03-12 DIAGNOSIS — R2689 Other abnormalities of gait and mobility: Secondary | ICD-10-CM | POA: Insufficient documentation

## 2016-03-12 DIAGNOSIS — R471 Dysarthria and anarthria: Secondary | ICD-10-CM | POA: Insufficient documentation

## 2016-03-12 DIAGNOSIS — M6289 Other specified disorders of muscle: Secondary | ICD-10-CM | POA: Diagnosis not present

## 2016-03-12 DIAGNOSIS — M6281 Muscle weakness (generalized): Secondary | ICD-10-CM

## 2016-03-12 DIAGNOSIS — I69351 Hemiplegia and hemiparesis following cerebral infarction affecting right dominant side: Secondary | ICD-10-CM | POA: Diagnosis not present

## 2016-03-12 DIAGNOSIS — E1129 Type 2 diabetes mellitus with other diabetic kidney complication: Secondary | ICD-10-CM | POA: Diagnosis not present

## 2016-03-12 DIAGNOSIS — D688 Other specified coagulation defects: Secondary | ICD-10-CM | POA: Diagnosis not present

## 2016-03-12 DIAGNOSIS — N186 End stage renal disease: Secondary | ICD-10-CM | POA: Diagnosis not present

## 2016-03-12 DIAGNOSIS — R278 Other lack of coordination: Secondary | ICD-10-CM

## 2016-03-12 DIAGNOSIS — I69315 Cognitive social or emotional deficit following cerebral infarction: Secondary | ICD-10-CM | POA: Insufficient documentation

## 2016-03-12 DIAGNOSIS — D509 Iron deficiency anemia, unspecified: Secondary | ICD-10-CM | POA: Diagnosis not present

## 2016-03-12 NOTE — Telephone Encounter (Signed)
New Message:   Pt will need Lovenox Bridging and needs Dr Acie Fredrickson to manager it please.

## 2016-03-12 NOTE — Therapy (Signed)
[facility] [address] [place], [place], [address] Phone: [phone]   Fax:  [phone]  Physical Therapy Treatment  Patient Details  Name: Isaiah [name] MRN: [id number] Date of Birth: 09/14/1965 Referring Provider: Dr. Kirsteins  Encounter Date: 03/12/2016      PT End of Session - 03/12/16 2245    Visit Number 5   Number of Visits 17   Date for PT Re-Evaluation 04/22/16  per PT eval 02/21/16-[place]   Authorization Type BCBS   PT Start Time 0850   PT Stop Time 0932   PT Time Calculation (min) 42 min   Equipment Utilized During Treatment Gait belt   Activity Tolerance Patient tolerated treatment well   Behavior During Therapy WFL for tasks assessed/performed      Past Medical History  Diagnosis Date  . Diabetes mellitus without complication (HCC)   . Sickle cell-thalassemia disease (HCC)     a. Sickle cell trait.  . Pulmonary embolism (HCC) 07/2011    a. Tx with Coumadin for 6 months (unknown cause per patient).  . Hyperkalemia 07/2011  . Acute renal failure (HCC) 07/2011  . Monoclonal gammopathy   . Sleep apnea     a. uses CPAP.  . Hypertension   . Gout   . Pulmonary embolism (HCC) 07/2011; 09/27/2014    a. Bilat PE 07/2011 - unclear cause, tx with 6 months Coumadin.;   . Monoclonal gammopathies   . Anemia   . Asthma   . OSA on CPAP   . History of recent blood transfusion 10/27/14    2 Units PRBC's  . ESRD (end stage renal disease) on dialysis (HCC)   . Stroke (HCC)   . A-fib (HCC)     Past Surgical History  Procedure Laterality Date  . Cholecystectomy    . Bone marrow biopsy    . Other surgical history      Retinal surgery  . Pars plana vitrectomy  02/17/2012    Procedure: PARS PLANA VITRECTOMY WITH 25 GAUGE;  Surgeon: John D Matthews, MD;  [facility];  Service: Ophthalmology;  Laterality: Right;  . Cholecystectomy  1990's?  . Eye surgery Right   . Insertion of dialysis catheter Right  10/16/2015    Procedure: INSERTION OF PALINDROME DIALYSIS CATHETER ;  Surgeon: Charles E Fields, MD;  Location: [facility];  Service: Vascular;  Laterality: Right;  . Av fistula placement Right 12/05/2015    Procedure: INSERTION OF ARTERIOVENOUS (AV) GORE-TEX GRAFT ARM;  Surgeon: Charles E Fields, MD;  Location[facility];  Service: Vascular;  Laterality: Right;    There were no vitals filed for this visit.      Subjective Assessment - 03/12/16 0854    Subjective Doing exercises.  No problems, no changes since last visit   Pertinent History Seizure 12/2015; DVT, PE-has IVC filter in placed, DM, CKD-stage V on dialysis, sickle cell anemia, OSA, asthma, HTN, tachycardia in hospital (12/06/15), AVF    Patient Stated Goals Pt's goal for therapy is to get back to full range of motion and return to work.                         [facility] Adult PT Treatment/Exercise - 03/12/16 2237    Transfers   Transfers Sit to Stand;Stand to Sit   Sit to Stand 5: Supervision;With upper extremity assist;From chair/3-in-1;From bed   Stand to Sit 5: Supervision;With upper extremity assist;To chair/3-in-1;To bed     Number of Reps 10 reps;Other sets (comment)  3 sets of 10 reps   Comments repeated transfers performed for functional lower extremity strengthening.  Sit<>stand from chair with ball squeezes for improved hip and quad strenthening.   Ambulation/Gait   Ambulation/Gait Yes   Ambulation/Gait Assistance 5: Supervision   Ambulation/Gait Assistance Details Short distance gait between activities in gym area, with cues for deliberate foot clearance and heelstrike   Ambulation Distance (Feet) 75 Feet  x2, then 60 ft x 2   Assistive device None   Gait Pattern Step-through pattern;Decreased dorsiflexion - right;Poor foot clearance - right   Ambulation Surface Level;Indoor   High Level Balance   High Level Balance Comments Toe walking x 10 ft, 2 reps, then heelwalking 10 ft, 2 sets in parallel bars.    Exercises   Exercises Other Exercises   Other Exercises  Seated resisted ankle dorsiflexion with blue theraband (pt reports green too easy at home), x 15 reps, with cues for correct technique.  Issued blue theraband for home and updated HEP.    Seated hip adduction ball squeezes x 10 reps             Balance Exercises - 03/12/16 0912    Balance Exercises: Standing   Rockerboard Anterior/posterior;EO;UE support;Other (comment)  Ankle/hip strategy x 10 reps each; arm swing min assist   Tandem Gait Forward;Retro;Intermittent upper extremity support;2 reps  in parallel bars>tandem marching 2 reps UE support   Heel Raises Limitations x 20   Toe Raise Limitations x 20   Other Standing Exercises On foam:  marching in place, alternating forward kicks, alternating forward step taps x 10 reps each; mini squats x 10 reps  with intermittent UE support; PT min guard>supervision           PT Education - 03/12/16 2245    Education provided Yes   Education Details Updated HEP for blue theraband for ankle dorsiflexion   Person(s) Educated Patient   Methods Explanation;Demonstration;Verbal cues;Handout   Comprehension Verbalized understanding;Returned demonstration;Verbal cues required;Need further instruction          PT Short Term Goals - 02/22/16 1243    PT SHORT TERM GOAL #1   Title Pt will perform HEP independenlty for improved strength, balance and gait.  TARGET 03/23/16   Time 4   Period Weeks   Status New   PT SHORT TERM GOAL #2   Title Pt will improve Berg Score to at least 49/56 for decreased fall risk.   Time 4   Period Weeks   Status New   PT SHORT TERM GOAL #3   Title Pt will improve TUG score to less than or equal to 13.5 seconds for decreased fall risk.   Time 4   Period Weeks   Status New   PT SHORT TERM GOAL #4   Title Pt will amb. 300' over even/uneven terrain with LRAD at MOD I level.   Status New           PT Long Term Goals - 02/22/16 1245    PT LONG  TERM GOAL #1   Title Pt will verbalize understanding of fall prevention in the home environment.  TARGET 04/22/16   Time 8   Period Weeks   Status New   PT LONG TERM GOAL #2   Title Pt will improve Dynamic Gait Index score to at least 19/24 for decreased fall risk.   Time 8   Period Weeks   Status New   PT  LONG TERM GOAL #3   Title Pt will improve single limb stance to at least 3 seconds each leg for improved negotiation of steps and obstacles.   Time 8   Period Weeks   Status New   PT LONG TERM GOAL #4   Title Pt will amb. 600' over even/uneven terrain IND without AD to improve functional moblity.    Time 8   Period Weeks   Status New   PT LONG TERM GOAL #5   Title Pt will ascend/descent 12 steps with one handrail at MOD I level to negotiate steps at home.   Time 8   Period Weeks   Status New               Plan - 03/12/16 2249    Clinical Impression Statement Pt needs cues for reinforcement for ankle and hip strengthening as well as use of ankle/hip strategies for balance. Pt continues to benefit from further skilled PT to address balance, strength and gait.   Rehab Potential Good   Clinical Impairments Affecting Rehab Potential co-morbidities   PT Frequency 2x / week   PT Duration 8 weeks   PT Treatment/Interventions ADLs/Self Care Home Management;Therapeutic exercise;Therapeutic activities;Functional mobility training;Gait training;Balance training;Neuromuscular re-education;Patient/family education   PT Next Visit Plan Continue to work on Hip and ankle strategies at counter or wall, compliant surface balance, single limb stance activities   Consulted and Agree with Plan of Care Patient      Patient will benefit from skilled therapeutic intervention in order to improve the following deficits and impairments:  Abnormal gait, Decreased balance, Decreased mobility, Decreased strength, Difficulty walking, Postural dysfunction  Visit Diagnosis: Muscle weakness  (generalized)  Unsteadiness on feet  Other abnormalities of gait and mobility     Problem List Patient Active Problem List   Diagnosis Date Noted  . Hemiparesis affecting right side as late effect of cerebrovascular accident ([place]) 02/28/2016  . Aphasia, late effect of cerebrovascular disease 02/28/2016  . Acute encephalopathy 01/08/2016  . Seizures ([place]) 01/08/2016  . History of ischemic left MCA stroke 01/08/2016  . Right sided weakness 01/08/2016  . Chronic combined systolic and diastolic CHF (congestive heart failure) ([place]) 01/08/2016  . Atrial fibrillation ([place]) 01/08/2016  . Leukocytosis 01/08/2016  . Benign essential HTN 01/08/2016  . Anemia of chronic disease 01/08/2016  . Controlled type 2 diabetes mellitus with diabetic nephropathy, without long-term current use of insulin ([place]) 01/08/2016  . History of DVT (deep vein thrombosis) 01/08/2016  . ESRD (end stage renal disease) on dialysis [facility])     Anaisha Mago W. 03/12/2016, 10:54 PM Frazier Butt., PT [facility] [address] [place], [place], [address] Phone: [phone]   Fax:  [phone]  Name: Zalman Hull [facility] MRN: [id number] Date of Birth: 08/03/1966

## 2016-03-12 NOTE — Patient Instructions (Signed)
ANKLE: Dorsiflexion (Band)    Sit at edge of surface. Place band around top of foot. Keeping heel on floor, raise toes of banded foot. Use blue band. Remember to pull band tight so you can feel resistance.  Hold for 3 seconds prior to relaxing. 15 reps, rest for a minute and do 15 more. Do twice a day. Copyright  VHI. All rights reserved.

## 2016-03-12 NOTE — Therapy (Signed)
[facility] [address] [place], [place], [address] Phone: [phone]   Fax:  [phone]  Occupational Therapy Treatment  Patient Details  Name: Isaiah [name] [name] MRN: [id number] Date of Birth: Jul 09, 1966 Referring Provider: Alysia Penna  Encounter Date: 03/12/2016      OT End of Session - 03/12/16 1246    Visit Number 4   Number of Visits 17   Date for OT Re-Evaluation 05/01/16   Authorization Type medicare   Authorization Time Period "$20 co pay, no visit limit, no auth required"   Authorization - Visit Number 4   Authorization - Number of Visits 10   OT Start Time [phone]   OT Stop Time 1014   OT Time Calculation (min) 43 min   Activity Tolerance Patient tolerated treatment well      Past Medical History  Diagnosis Date  . Diabetes mellitus without complication ([place])   . Sickle cell-thalassemia disease ([place])     a. Sickle cell trait.  . Pulmonary embolism ([place]) 07/2011    a. Tx with Coumadin for 6 months (unknown cause per patient).  . Hyperkalemia 07/2011  . Acute renal failure ([place]) 07/2011  . Monoclonal gammopathy   . Sleep apnea     a. uses CPAP.  [name] Hypertension   . Gout   . Pulmonary embolism ([place]) 07/2011; 09/27/2014    a. Bilat PE 07/2011 - unclear cause, tx with 6 months Coumadin.;   . Monoclonal gammopathies   . Anemia   . Asthma   . OSA on CPAP   . History of recent blood transfusion 10/27/14    2 Units PRBC's  . ESRD (end stage renal disease) on dialysis ([place])   . Stroke ([place])   . A-fib [facility])     Past Surgical History  Procedure Laterality Date  . Cholecystectomy    . Bone marrow biopsy    . Other surgical history      Retinal surgery  . Pars plana vitrectomy  02/17/2012    Procedure: PARS PLANA VITRECTOMY WITH 25 GAUGE;  Surgeon: Hayden Pedro, MD;  Location: [facility];  Service: Ophthalmology;  Laterality: Right;  . Cholecystectomy  1990's?  . Eye surgery Right   . Insertion of  dialysis catheter Right 10/16/2015    Procedure: INSERTION OF PALINDROME DIALYSIS CATHETER ;  Surgeon: Elam Dutch, MD;  Location: [facility];  Service: Vascular;  Laterality: Right;  . Av fistula placement Right 12/05/2015    Procedure: INSERTION OF ARTERIOVENOUS (AV) GORE-TEX GRAFT ARM;  Surgeon: Elam Dutch, MD;  Location: [facility];  Service: Vascular;  Laterality: Right;    There were no vitals filed for this visit.      Subjective Assessment - 03/12/16 0934    Subjective  I am still trying to find the shoe buttons I will take a picture so I can find them   Pertinent History see Epic    Patient Stated Goals to use right hand better   Currently in Pain? No/denies                      OT Treatments/Exercises (OP) - 03/12/16 0001    Exercises   Exercises Hand   Hand Exercises   Hand Gripper with Small Beads 25# of resistance with R hand picking up 1 inch blocks with only 3 drops. Pt did not need any rest breaks today and exhibited better control of R hand.  Pt with improved  sustained strength in R hand   Other Hand Exercises AAROM for composite flexion after manual therapy with good results. Pt with improved flexibility and 95% composite flexion of R hand. 15 reps x2.   Manual Therapy   Manual Therapy Edema management;Joint mobilization;Soft tissue mobilization   Manual therapy comments Edema massage followed by joint mob, soft tissue mob to decreased tigthness, improve AROM in R hand prior to activity. Pt reports his access for dialysis in in his R hand and he feels this may contribute some to swelling in R hand. Pt stated MD wants to "tweak my access in outpatient procedure at the end of the month."               Balance Exercises - 03/12/16 0912    Balance Exercises: Standing   Rockerboard Anterior/posterior;EO;UE support;Other (comment)  Ankle/hip strategy x 10 reps each; arm swing min assist   Tandem Gait Forward;Retro;Intermittent upper extremity support;2  reps  in parallel bars>tandem marching 2 reps UE support   Heel Raises Limitations x 20   Toe Raise Limitations x 20   Other Standing Exercises On foam:  marching in place, alternating forward kicks, alternating forward step taps x 10 reps each; mini squats x 10 reps  with intermittent UE support; PT min guard>supervision             OT Short Term Goals - 03/12/16 1243    OT SHORT TERM GOAL #1   Title I with initiall HEP- 03/20/2016   Time 4   Period Weeks   Status Achieved   OT SHORT TERM GOAL #2   Title Pt will perform all basic ADLS with supervision.   Time 4   Period Weeks   Status Achieved   OT SHORT TERM GOAL #3   Title Patient will demonstrate improved functional strength in right UE as evidenced by brushing teeth with dominant right hand   Time 4   Period Weeks   Status Achieved   OT SHORT TERM GOAL #4   Title Pt will demonstrate ability to retrieve a light weight (less than 1 lb) object at 120 shoulder flexion with RUE.   Time 4   Period Weeks   Status On-going   OT SHORT TERM GOAL #5   Title Patient will demostrate improved coordination for right hand as evidenced by 15 on box and blocks test   Time 4   Period Weeks   Status Achieved  25 on 03/12/2016   OT SHORT TERM GOAL #6   Title Pt will report feeding himself with RUE 75% of the time.   Time 4   Period Weeks   Status On-going           OT Long Term Goals - 03/12/16 1244    OT LONG TERM GOAL #1   Title I with updated HEP.- 04/17/2016   Time 8   Period Weeks   Status On-going   OT LONG TERM GOAL #2   Title Pt will demonstrate ability to write his name legibily.   Time 8   Period Weeks   Status On-going   OT LONG TERM GOAL #3   Title Patient will demonstrate adequate strength and coordination in dominant right hand to tighten laces and tie shoes using bilateral technique   Time 8   Period Weeks   Status On-going   OT LONG TERM GOAL #4   Title Assess 9 hole peg test and determine goal    Status On-going  OT LONG TERM GOAL #5   Title Pt will demonstrate RUE grip strength of 20+ lbs for increased functional use.   Time 8   Period Weeks   Status On-going   OT LONG TERM GOAL #6   Title Pt will perform all basic ADLs and simple meal prep modified independently.   Time 8   Period Weeks   Status On-going               Plan - 03/12/16 1245    Clinical Impression Statement Pt is progressing toward STGs and demostrates improved functional use of R hand.   Rehab Potential Good   OT Frequency 2x / week   OT Duration 8 weeks   OT Treatment/Interventions Self-care/ADL training;Therapeutic exercise;Patient/family education;Balance training;Splinting;Manual Therapy;Neuromuscular education;Ultrasound;Therapeutic exercises;Therapeutic activities;DME and/or AE instruction;Parrafin;Cryotherapy;Electrical Stimulation;Fluidtherapy;Scar mobilization;Cognitive remediation/compensation;Moist Heat;Contrast Bath;Passive range of motion;Visual/perceptual remediation/compensation;Functional Mobility Training   Plan NMR for RUE, strengthening and functional use of R hand, try simple writing   OT Home Exercise Plan Education issued:  12/31/15 initial HEP (coordination, cane ex);  added putty on 02/26/2016   Consulted and Agree with Plan of Care Patient      Patient will benefit from skilled therapeutic intervention in order to improve the following deficits and impairments:  Cardiopulmonary status limiting activity, Decreased activity tolerance, Decreased balance, Decreased cognition, Decreased endurance, Decreased coordination, Decreased strength, Increased edema, Impaired tone, Impaired UE functional use, Impaired vision/preception  Visit Diagnosis: Apraxia  Muscle weakness (generalized)  Hemiplegia and hemiparesis following cerebral infarction affecting right dominant side (HCC)  Other lack of coordination    Problem List Patient Active Problem List   Diagnosis Date Noted  .  Hemiparesis affecting right side as late effect of cerebrovascular accident ([place]) 02/28/2016  . Aphasia, late effect of cerebrovascular disease 02/28/2016  . Acute encephalopathy 01/08/2016  . Seizures ([place]) 01/08/2016  . History of ischemic left MCA stroke 01/08/2016  . Right sided weakness 01/08/2016  . Chronic combined systolic and diastolic CHF (congestive heart failure) ([place]) 01/08/2016  . Atrial fibrillation ([place]) 01/08/2016  . Leukocytosis 01/08/2016  . Benign essential HTN 01/08/2016  . Anemia of chronic disease 01/08/2016  . Controlled type 2 diabetes mellitus with diabetic nephropathy, without long-term current use of insulin ([place]) 01/08/2016  . History of DVT (deep vein thrombosis) 01/08/2016  . ESRD (end stage renal disease) on dialysis [facility])     Quay Burow , OTR/L  03/12/2016, 12:48 PM  Isaiah [name] [address] [place], [place], [address] Phone: [phone]   Fax:  [phone]  Name: Isaiah [name] [facility] MRN: [id number] Date of Birth: [birth year]

## 2016-03-12 NOTE — Telephone Encounter (Signed)
Spoke with pt's wife.  He is not managed by our Coumadin clinic so we do not have any recent INR results.  Will need INR check to ensure Lovenox is dosed appropriately.  Appt made for Friday 7/7.  His procedure is on Thursday 7/13.

## 2016-03-12 NOTE — Telephone Encounter (Signed)
Notified Dr. Carlis Abbott of patients scheduled procedure with Dr. Bridgett Larsson. Isaiah [name] is scheduled for Right arm Fistulogram on 03/20/16 and will need to hold Coumadin for five days prior. Dr. Carlis Abbott gave the okay to hold the Coumadin for the five days, with instructions that the patient did need to have Lovenox bridge. Dr. Carlis Abbott stated that [name] cardiologist could give further recommendations on Lovenox bridging. Contacted Isaiah [name] with Dr. Ainsley Spinner instructions; verbalized understanding. Will contact patient's cardiologist, Dr. Acie Fredrickson, regarding Lovenox.

## 2016-03-13 ENCOUNTER — Ambulatory Visit: Payer: 59 | Admitting: Speech Pathology

## 2016-03-13 ENCOUNTER — Ambulatory Visit: Payer: 59 | Admitting: Physical Therapy

## 2016-03-13 DIAGNOSIS — I6789 Other cerebrovascular disease: Secondary | ICD-10-CM | POA: Diagnosis not present

## 2016-03-13 DIAGNOSIS — R482 Apraxia: Secondary | ICD-10-CM | POA: Diagnosis not present

## 2016-03-13 DIAGNOSIS — R2689 Other abnormalities of gait and mobility: Secondary | ICD-10-CM | POA: Diagnosis not present

## 2016-03-13 DIAGNOSIS — Z7901 Long term (current) use of anticoagulants: Secondary | ICD-10-CM | POA: Diagnosis not present

## 2016-03-13 DIAGNOSIS — R2681 Unsteadiness on feet: Secondary | ICD-10-CM

## 2016-03-13 DIAGNOSIS — M6281 Muscle weakness (generalized): Secondary | ICD-10-CM | POA: Diagnosis not present

## 2016-03-13 DIAGNOSIS — I69315 Cognitive social or emotional deficit following cerebral infarction: Secondary | ICD-10-CM | POA: Diagnosis not present

## 2016-03-13 DIAGNOSIS — R471 Dysarthria and anarthria: Secondary | ICD-10-CM

## 2016-03-13 DIAGNOSIS — R278 Other lack of coordination: Secondary | ICD-10-CM | POA: Diagnosis not present

## 2016-03-13 DIAGNOSIS — G40309 Generalized idiopathic epilepsy and epileptic syndromes, not intractable, without status epilepticus: Secondary | ICD-10-CM | POA: Diagnosis not present

## 2016-03-13 DIAGNOSIS — R41841 Cognitive communication deficit: Secondary | ICD-10-CM | POA: Diagnosis not present

## 2016-03-13 DIAGNOSIS — E1165 Type 2 diabetes mellitus with hyperglycemia: Secondary | ICD-10-CM | POA: Diagnosis not present

## 2016-03-13 DIAGNOSIS — I69351 Hemiplegia and hemiparesis following cerebral infarction affecting right dominant side: Secondary | ICD-10-CM | POA: Diagnosis not present

## 2016-03-13 NOTE — Therapy (Signed)
[facility] [address] [place], [place], [address] Phone: [phone]   Fax:  [phone]  Physical Therapy Treatment  Patient Details  Name: Isaiah [name] [facility] MRN: [id number] Date of Birth: [birth year] Referring Provider: Dr. Letta Pate  Encounter Date: 03/13/2016      PT End of Session - 03/13/16 1454    Visit Number 6   Number of Visits 17   Date for PT Re-Evaluation 04/22/16  per PT eval 02/21/16-[place]   Authorization Type BCBS   PT Start Time 1402   PT Stop Time 1446   PT Time Calculation (min) 44 min   Equipment Utilized During Treatment Gait belt   Activity Tolerance Patient tolerated treatment well   Behavior During Therapy [name] for tasks assessed/performed      Past Medical History  Diagnosis Date  . Diabetes mellitus without complication ([place])   . Sickle cell-thalassemia disease ([place])     a. Sickle cell trait.  . Pulmonary embolism ([place]) 07/2011    a. Tx with Coumadin for 6 months (unknown cause per patient).  . Hyperkalemia 07/2011  . Acute renal failure ([place]) 07/2011  . Monoclonal gammopathy   . Sleep apnea     a. uses CPAP.  [name] Hypertension   . Gout   . Pulmonary embolism ([place]) 07/2011; 09/27/2014    a. Bilat PE 07/2011 - unclear cause, tx with 6 months Coumadin.;   . Monoclonal gammopathies   . Anemia   . Asthma   . OSA on CPAP   . History of recent blood transfusion 10/27/14    2 Units PRBC's  . ESRD (end stage renal disease) on dialysis ([place])   . Stroke ([place])   . A-fib [facility])     Past Surgical History  Procedure Laterality Date  . Cholecystectomy    . Bone marrow biopsy    . Other surgical history      Retinal surgery  . Pars plana vitrectomy  02/17/2012    Procedure: PARS PLANA VITRECTOMY WITH 25 GAUGE;  Surgeon: Hayden Pedro, MD;  Location: [facility];  Service: Ophthalmology;  Laterality: Right;  . Cholecystectomy  1990's?  . Eye surgery Right   . Insertion of dialysis catheter Right  10/16/2015    Procedure: INSERTION OF PALINDROME DIALYSIS CATHETER ;  Surgeon: Elam Dutch, MD;  Location: [place];  Service: Vascular;  Laterality: Right;  . Av fistula placement Right 12/05/2015    Procedure: INSERTION OF ARTERIOVENOUS (AV) GORE-TEX GRAFT ARM;  Surgeon: Elam Dutch, MD;  Location: [facility];  Service: Vascular;  Laterality: Right;    There were no vitals filed for this visit.      Subjective Assessment - 03/13/16 1405    Subjective Denies changes since last visit.  Wants to use recumbent bike as part of exercise.   Pertinent History Seizure 12/2015; DVT, PE-has IVC filter in placed, DM, CKD-stage V on dialysis, sickle cell anemia, OSA, asthma, HTN, tachycardia in hospital (12/06/15), AVF    Patient Stated Goals Pt's goal for therapy is to get back to full range of motion and return to work.   Currently in Pain? No/denies             [facility] Adult PT Treatment/Exercise - 03/13/16 0001    Knee/Hip Exercises: Aerobic   Nustep Scifit level 3.0 all 4 extremities x 8 minutes with rpm>60   Knee/Hip Exercises: Machines for Strengthening   Total Gym Leg Press bil LE 65# x 20, 70#  x 20;RLE only 55# x 20 x 2 sets             Balance Exercises - 03/13/16 1437    Balance Exercises: Standing   Wall Bumps Hip  in parallel bars for foward and backward recovery   Rockerboard Anterior/posterior;Head turns;EO;10 reps;Intermittent UE support   Other Standing Exercises on blue foam mat doubled taps to 10" step alternting LE's with 1 to intermittent UE support;standing on ramp up/down hill with eyes open then eyes closed for head turns and head nods           PT Education - 03/12/16 2245    Education provided Yes   Education Details Updated HEP for blue theraband for ankle dorsiflexion   Person(s) Educated Patient   Methods Explanation;Demonstration;Verbal cues;Handout   Comprehension Verbalized understanding;Returned demonstration;Verbal cues required;Need further  instruction          PT Short Term Goals - 02/22/16 1243    PT SHORT TERM GOAL #1   Title Pt will perform HEP independenlty for improved strength, balance and gait.  TARGET 03/23/16   Time 4   Period Weeks   Status New   PT SHORT TERM GOAL #2   Title Pt will improve Berg Score to at least 49/56 for decreased fall risk.   Time 4   Period Weeks   Status New   PT SHORT TERM GOAL #3   Title Pt will improve TUG score to less than or equal to 13.5 seconds for decreased fall risk.   Time 4   Period Weeks   Status New   PT SHORT TERM GOAL #4   Title Pt will amb. 300' over even/uneven terrain with LRAD at MOD I level.   Status New           PT Long Term Goals - 02/22/16 1245    PT LONG TERM GOAL #1   Title Pt will verbalize understanding of fall prevention in the home environment.  TARGET 04/22/16   Time 8   Period Weeks   Status New   PT LONG TERM GOAL #2   Title Pt will improve Dynamic Gait Index score to at least 19/24 for decreased fall risk.   Time 8   Period Weeks   Status New   PT LONG TERM GOAL #3   Title Pt will improve single limb stance to at least 3 seconds each leg for improved negotiation of steps and obstacles.   Time 8   Period Weeks   Status New   PT LONG TERM GOAL #4   Title Pt will amb. 600' over even/uneven terrain IND without AD to improve functional moblity.    Time 8   Period Weeks   Status New   PT LONG TERM GOAL #5   Title Pt will ascend/descent 12 steps with one handrail at MOD I level to negotiate steps at home.   Time 8   Period Weeks   Status New               Plan - 03/13/16 1454    Clinical Impression Statement Pt slowly progressing toward goals with improvement in balance activities with various surfaces.  Continues to need cues for hip and ankle strategies.  Continue PT per POC.   Rehab Potential Good   Clinical Impairments Affecting Rehab Potential co-morbidities   PT Frequency 2x / week   PT Duration 8 weeks   PT  Treatment/Interventions ADLs/Self Care Home Management;Therapeutic exercise;Therapeutic activities;Functional mobility training;Gait  training;Balance training;Neuromuscular re-education;Patient/family education   PT Next Visit Plan Continue to work on Hip and ankle strategies at counter or wall, compliant surface balance, single limb stance activities   Consulted and Agree with Plan of Care Patient      Patient will benefit from skilled therapeutic intervention in order to improve the following deficits and impairments:  Abnormal gait, Decreased balance, Decreased mobility, Decreased strength, Difficulty walking, Postural dysfunction  Visit Diagnosis: Muscle weakness (generalized)  Unsteadiness on feet  Other abnormalities of gait and mobility     Problem List Patient Active Problem List   Diagnosis Date Noted  . Hemiparesis affecting right side as late effect of cerebrovascular accident ([place]) 02/28/2016  . Aphasia, late effect of cerebrovascular disease 02/28/2016  . Acute encephalopathy 01/08/2016  . Seizures ([place]) 01/08/2016  . History of ischemic left MCA stroke 01/08/2016  . Right sided weakness 01/08/2016  . Chronic combined systolic and diastolic CHF (congestive heart failure) ([place]) 01/08/2016  . Atrial fibrillation ([place]) 01/08/2016  . Leukocytosis 01/08/2016  . Benign essential HTN 01/08/2016  . Anemia of chronic disease 01/08/2016  . Controlled type 2 diabetes mellitus with diabetic nephropathy, without long-term current use of insulin ([place]) 01/08/2016  . History of DVT (deep vein thrombosis) 01/08/2016  . ESRD (end stage renal disease) on dialysis [name])     Narda Bonds 03/13/2016, 2:58 PM  [facility] [address] [place], [place], [address] Phone: [phone]   Fax:  [phone]  Name: Maleko Greulich [facility] MRN: [id number] Date of Birth: 1966-05-15    Narda Bonds, [place] 03/13/2016 2:58 PM Phone: [phone] Fax: [phone]

## 2016-03-13 NOTE — Therapy (Signed)
[facility] [address] [place], [place], [address] Phone: [phone]   Fax:  [phone]  Speech Language Pathology Treatment  Patient Details  Name: Isaiah [name] [facility] MRN: [id number] Date of Birth: 12-11-1965 Referring Provider: Alysia Penna, MD  Encounter Date: 03/13/2016      End of Session - 03/13/16 1155    Visit Number 6   Number of Visits 20   Date for SLP Re-Evaluation 05/02/16   SLP Start Time 1106   SLP Stop Time  1145   SLP Time Calculation (min) 39 min      Past Medical History  Diagnosis Date  . Diabetes mellitus without complication ([place])   . Sickle cell-thalassemia disease ([place])     a. Sickle cell trait.  . Pulmonary embolism ([place]) 07/2011    a. Tx with Coumadin for 6 months (unknown cause per patient).  . Hyperkalemia 07/2011  . Acute renal failure ([place]) 07/2011  . Monoclonal gammopathy   . Sleep apnea     a. uses CPAP.  [name] Hypertension   . Gout   . Pulmonary embolism ([place]) 07/2011; 09/27/2014    a. Bilat PE 07/2011 - unclear cause, tx with 6 months Coumadin.;   . Monoclonal gammopathies   . Anemia   . Asthma   . OSA on CPAP   . History of recent blood transfusion 10/27/14    2 Units PRBC's  . ESRD (end stage renal disease) on dialysis ([place])   . Stroke ([place])   . A-fib [facility])     Past Surgical History  Procedure Laterality Date  . Cholecystectomy    . Bone marrow biopsy    . Other surgical history      Retinal surgery  . Pars plana vitrectomy  02/17/2012    Procedure: PARS PLANA VITRECTOMY WITH 25 GAUGE;  Surgeon: Hayden Pedro, MD;  Location: [facility];  Service: Ophthalmology;  Laterality: Right;  . Cholecystectomy  1990's?  . Eye surgery Right   . Insertion of dialysis catheter Right 10/16/2015    Procedure: INSERTION OF PALINDROME DIALYSIS CATHETER ;  Surgeon: Elam Dutch, MD;  Location: [place];  Service: Vascular;  Laterality: Right;  . Av fistula placement Right 12/05/2015     Procedure: INSERTION OF ARTERIOVENOUS (AV) GORE-TEX GRAFT ARM;  Surgeon: Elam Dutch, MD;  Location: [facility];  Service: Vascular;  Laterality: Right;    There were no vitals filed for this visit.      Subjective Assessment - 03/13/16 1109    Subjective "I've been working on 4 syllable words"   Currently in Pain? No/denies               ADULT SLP TREATMENT - 03/13/16 1111    General Information   Behavior/Cognition Alert;Cooperative;Pleasant mood   Treatment Provided   Treatment provided Cognitive-Linquistic   Pain Assessment   Pain Assessment No/denies pain   Cognitive-Linquistic Treatment   Treatment focused on Apraxia   Skilled Treatment Facilitated apraxia practice with repetition of multisyllabic words with predictable vowel distortions - I question the possiblity of foreign accent syndrome as pt's distortions consistently sound [place]. Pt required placement cues, visual cues and cues for slow rate with 60% accuracy.    Assessment / Recommendations / Plan   Plan Continue with current plan of care   Progression Toward Goals   Progression toward goals Progressing toward goals            SLP Short Term Goals -  03/13/16 [place] #1   Title pt will demo HEP for apraxia/motor plannning with independence over 3 sessions   Baseline began new POC 02-21-16   Time 3   Period Weeks  or 8 visits, for all STGs   Status On-going   SLP SHORT TERM GOAL #2   Title pt will demo HEP for dysarthria with independence over 3 sessions   Time 3   Period Weeks   Status On-going   SLP SHORT TERM GOAL #3   Title pt will demo speech 95% accurate in sentence-length responses with modified independence (compensations) over 3 sessions   Time 3   Period Weeks   Status On-going          SLP Long Term Goals - 03/13/16 1155    SLP LONG TERM GOAL #1   Title pt will demo functional speech in 20 minutes mod complex/complex conversation with modified  independnence   Baseline new POC on 02-21-16   Time 7   Period Weeks  or 16 visits, for all LTGs   Status On-going   SLP LONG TERM GOAL #2   Title pt will demo HEP for speech-motor planning indpendently over 5 total sessions    Time 7   Period Weeks   Status On-going   SLP LONG TERM GOAL #3   Title pt will demo HEP for dysarthria independently over 5 total sessions   Time 7   Period Weeks   Status On-going          Plan - 03/13/16 1154    Clinical Impression Statement Continue speech therapy to maximize intellgiblity and cognitive skills for possible return to work.    Speech Therapy Frequency 2x / week   Treatment/Interventions SLP instruction and feedback;Compensatory strategies;Internal/external aids;Oral motor exercises;Patient/family education;Functional tasks;Cueing hierarchy   Potential to Achieve Goals Good   Potential Considerations Severity of impairments   Consulted and Agree with Plan of Care Patient      Patient will benefit from skilled therapeutic intervention in order to improve the following deficits and impairments:   Dysarthria and anarthria  Apraxia    Problem List Patient Active Problem List   Diagnosis Date Noted  . Hemiparesis affecting right side as late effect of cerebrovascular accident ([place]) 02/28/2016  . Aphasia, late effect of cerebrovascular disease 02/28/2016  . Acute encephalopathy 01/08/2016  . Seizures ([place]) 01/08/2016  . History of ischemic left MCA stroke 01/08/2016  . Right sided weakness 01/08/2016  . Chronic combined systolic and diastolic CHF (congestive heart failure) ([place]) 01/08/2016  . Atrial fibrillation ([place]) 01/08/2016  . Leukocytosis 01/08/2016  . Benign essential HTN 01/08/2016  . Anemia of chronic disease 01/08/2016  . Controlled type 2 diabetes mellitus with diabetic nephropathy, without long-term current use of insulin ([place]) 01/08/2016  . History of DVT (deep vein thrombosis) 01/08/2016  . ESRD (end stage renal  disease) on dialysis [facility])     Sylvio Weatherall, Annye Rusk MS, CCC-SLP 03/13/2016, 11:56 AM  [facility] [address] [place], [place], [address] Phone: [phone]   Fax:  [phone]   Name: Steffon Gladu [facility] MRN: [id number] Date of Birth: [birth year]

## 2016-03-14 ENCOUNTER — Ambulatory Visit (INDEPENDENT_AMBULATORY_CARE_PROVIDER_SITE_OTHER): Payer: 59 | Admitting: *Deleted

## 2016-03-14 ENCOUNTER — Encounter: Payer: Self-pay | Admitting: Pharmacist

## 2016-03-14 DIAGNOSIS — E1129 Type 2 diabetes mellitus with other diabetic kidney complication: Secondary | ICD-10-CM | POA: Diagnosis not present

## 2016-03-14 DIAGNOSIS — T8249XA Other complication of vascular dialysis catheter, initial encounter: Secondary | ICD-10-CM | POA: Diagnosis not present

## 2016-03-14 DIAGNOSIS — D688 Other specified coagulation defects: Secondary | ICD-10-CM | POA: Diagnosis not present

## 2016-03-14 DIAGNOSIS — I4891 Unspecified atrial fibrillation: Secondary | ICD-10-CM | POA: Diagnosis not present

## 2016-03-14 DIAGNOSIS — I2782 Chronic pulmonary embolism: Secondary | ICD-10-CM

## 2016-03-14 DIAGNOSIS — D509 Iron deficiency anemia, unspecified: Secondary | ICD-10-CM | POA: Diagnosis not present

## 2016-03-14 DIAGNOSIS — N186 End stage renal disease: Secondary | ICD-10-CM | POA: Diagnosis not present

## 2016-03-14 LAB — POCT INR: INR: 2.3

## 2016-03-14 MED ORDER — ENOXAPARIN SODIUM 100 MG/ML ~~LOC~~ SOLN: 100.0000 mg | SUBCUTANEOUS | Status: DC

## 2016-03-14 MED FILL — ENOXAPARIN 100 MG/ML SYR: 100 | 10 days supply | Qty: 10 | Fill #0

## 2016-03-14 NOTE — Patient Outreach (Addendum)
[name]) Care Management  [place]   03/14/2016  Jensen 03/15/1966 [id number]  Subjective: 50 year old male presenting for 3 month Link to Wellness followup diabetes visit.  His past medical history is significant for CHF (LVEF 40-45%), hypertension, atrial fibrillation, type 2 diabetes, ESRD (on HD on Monday, Wednesday, Friday), Seizures, and Stroke.   He states he is status-post a stroke (09/2015) and seizure (01/06/16) and is currently going to rehabilitation. He continues to have limited mobility in his right hand.  He denies seizures since he has been on Vimpat.  His last PCP visit was last week.  He denies signs/symptoms of hypoglycemia including dizziness or lightheadedness.  He also denies SBP <100 mmHg. Denies numbness or tingling in his feet or ankles.  Denies nocturia and states he is making some urine around once per day even on dialysis. He states he checks his feet daily and has a podiatrist appointment next week.   Objective:  01/06/16 A1C 4.4% SMBG: 14 day before meals avg: 120; 14 day after meals (1-2 hrs) average: 122; overall 14 day average 166  Encounter Medications: Outpatient Encounter Prescriptions as of 02/25/2016  Medication Sig Note  . aspirin EC 81 MG tablet Take 1 tablet (81 mg total) by mouth daily.   [name] epoetin alfa (EPOGEN,PROCRIT) 69485 UNIT/ML injection Inject 5,000 Units into the skin once a week. Reported on 01/17/2016 11/16/2015: Micera given in dialysis  . fluticasone furoate-vilanterol (BREO ELLIPTA) 100-25 MCG/INH AEPB Inhale 1 puff into the lungs daily.   . Insulin Glargine (LANTUS) 100 UNIT/ML Solostar Pen Inject 10 Units into the skin daily at 10 pm. (Patient taking differently: Inject 25 Units into the skin daily at 10 pm. )   . Lacosamide (VIMPAT) 100 MG TABS Take 1 tablet (100 mg total) by mouth 2 (two) times daily. 02/21/2016: Patient and mother indicate he is taking 20 mg twice daily  . lacosamide 100 MG TABS Take 1  tablet (100 mg total) by mouth 2 (two) times daily.   . metoprolol tartrate (LOPRESSOR) 25 MG tablet Take 0.5 tablets (12.5 mg total) by mouth 2 (two) times daily.   . midodrine (PROAMATINE) 2.5 MG tablet Take 1 tablet (2.5 mg total) by mouth 2 (two) times daily with a meal.   . multivitamin (RENA-VIT) TABS tablet Take 1 tablet by mouth at bedtime.   [name] NEEDLE, DISP, 27 G (BD DISP NEEDLES) 27G X 1/2" MISC 1 application by Does not apply route daily after supper.   . pantoprazole (PROTONIX) 40 MG tablet Take 1 tablet (40 mg total) by mouth 2 (two) times daily.   [name] warfarin (COUMADIN) 5 MG tablet Take 1.5 tablets (7.5 mg total) by mouth daily at 6 PM.   . [DISCONTINUED] SENSIPAR 60 MG tablet Take 60 mg by mouth daily. 01/01/2016: Received from: External Pharmacy Received Sig:    No facility-administered encounter medications on file as of 02/25/2016.    Functional Status: In your present state of health, do you have any difficulty performing the following activities: 03/14/2016 12/03/2015  Hearing? N N  Vision? N N  Difficulty concentrating or making decisions? N N  Walking or climbing stairs? Y N  Dressing or bathing? Y N  Doing errands, shopping? Tempie Donning    Fall/Depression Screening: PHQ 2/9 Scores 03/14/2016 12/11/2015 11/16/2015 03/22/2015  PHQ - 2 Score 0 0 0 0  PHQ- 9 Score - - 0 -    Assessment: Diabetes: A1C at goal <7% at  4.4% treated with Lantus 25 units daily.  Patient denies signs/symptoms of hypoglycemia but low A1C is concerning for nocturnal hypoglycemia.  On ASA but Not on statin with last LDL of 52 mg/dL.   Plan: Followup with PCP for 14 day CGM to assess for hypoglycemia Counseled to continue to check blood glucose 3x/day and as needed Counseled on proper signs/symptoms of hypoglycemia and proper treatment.    THN CM Care Plan Problem One        Most Recent Value   Care Plan Problem One  Probable Hypoglycemia   Role Documenting the Problem One  Clinical Pharmacist   Care Plan  for Problem One  Active   THN CM Short Term Goal #1 (0-30 days)  Measure SMBG 3 times daily and monitor for s/sx of hypoglycemia   THN CM Short Term Goal #1 Start Date  02/25/16   Interventions for Short Term Goal #1  Patient will check SMBG 3x/day and will monitor for s/sx of hypoglycemia.  Plan to follow up with PCP for 14 day CGM. SMBG to be measured by BG meter at next visit.

## 2016-03-14 NOTE — Patient Instructions (Addendum)
03/14/16: Last dose of Coumadin  03/15/16: No coumadin or Lovenox   03/16/16: Inject Lovenox 165m in the fatty abdominal tissue at least 2 inches from the belly button daily at 8am rotate sites. No Coumadin  03/17/16: Inject Lovenox 1029min the fatty tissue at  8am. No Coumadin  03/18/16: Inject Lovenox 10059mn the fatty tissue at 8am. No Coumadin  03/19/16: NO LOVENOX, NO COUMADIN  03/20/16: Procedure Day - NO LOVENOX - Resume Coumadin in the evening or as directed by doctor (take an extra half tablet with usual dose for 2 days then resume normal dose) Coumadin 2 tabs  03/21/16: Resume Lovenox 100m34mject in the fatty tissue at 8am and take coumadin 2 tabs  03/22/16: Inject Lovenox 100mg14mthe fatty tissue at 8am and take coumadin regular dose  03/23/16: Inject Lovenox 100mg 26mthe fatty tissue at 8am and take coumadin regular dose  03/24/16: Inject Lovenox 100mg i61me fatty tissue at 8am and take coumadin regular dose  03/25/16: Inject Lovenox 100mg in87m fatty tissue at 8am and report to appt at 1230pm  Main # [phone] [id number] Clinic [phone]

## 2016-03-17 ENCOUNTER — Telehealth: Payer: Self-pay

## 2016-03-17 DIAGNOSIS — N2581 Secondary hyperparathyroidism of renal origin: Secondary | ICD-10-CM | POA: Diagnosis not present

## 2016-03-17 DIAGNOSIS — D631 Anemia in chronic kidney disease: Secondary | ICD-10-CM | POA: Diagnosis not present

## 2016-03-17 DIAGNOSIS — D509 Iron deficiency anemia, unspecified: Secondary | ICD-10-CM | POA: Diagnosis not present

## 2016-03-17 DIAGNOSIS — E1129 Type 2 diabetes mellitus with other diabetic kidney complication: Secondary | ICD-10-CM | POA: Diagnosis not present

## 2016-03-17 DIAGNOSIS — N186 End stage renal disease: Secondary | ICD-10-CM | POA: Diagnosis not present

## 2016-03-17 DIAGNOSIS — D688 Other specified coagulation defects: Secondary | ICD-10-CM | POA: Diagnosis not present

## 2016-03-17 DIAGNOSIS — T8249XA Other complication of vascular dialysis catheter, initial encounter: Secondary | ICD-10-CM | POA: Diagnosis not present

## 2016-03-17 NOTE — Telephone Encounter (Signed)
Pt's wife called, states pt was in office on Friday for Lovenox bridging instructions.  Pt was supposed to start taking Lovenox shots at 8am yesterday 03/16/16, but pt's wife forgot to give shot at 8am and instead gave him a shot at 6pm yesterday 03/16/16.  Advised pt's wife this was ok, but she would need to adjust the times of today and tomorrow's Lovenox injections to 6pm on 7/10 and 7/11, No Lovenox on 7/12 prior to procedure on 03/20/16.  Pt's wife verbalized understanding, advised to otherwise follow printed Lovenox instructions post procedure as given on Friday 03/14/16.

## 2016-03-18 ENCOUNTER — Ambulatory Visit: Payer: 59 | Admitting: Physical Therapy

## 2016-03-18 ENCOUNTER — Encounter: Payer: Self-pay | Admitting: Occupational Therapy

## 2016-03-18 ENCOUNTER — Ambulatory Visit: Payer: 59 | Admitting: Occupational Therapy

## 2016-03-18 DIAGNOSIS — R2689 Other abnormalities of gait and mobility: Secondary | ICD-10-CM

## 2016-03-18 DIAGNOSIS — I69351 Hemiplegia and hemiparesis following cerebral infarction affecting right dominant side: Secondary | ICD-10-CM | POA: Diagnosis not present

## 2016-03-18 DIAGNOSIS — R2681 Unsteadiness on feet: Secondary | ICD-10-CM | POA: Diagnosis not present

## 2016-03-18 DIAGNOSIS — R41841 Cognitive communication deficit: Secondary | ICD-10-CM | POA: Diagnosis not present

## 2016-03-18 DIAGNOSIS — M6281 Muscle weakness (generalized): Secondary | ICD-10-CM

## 2016-03-18 DIAGNOSIS — I69315 Cognitive social or emotional deficit following cerebral infarction: Secondary | ICD-10-CM | POA: Diagnosis not present

## 2016-03-18 DIAGNOSIS — R278 Other lack of coordination: Secondary | ICD-10-CM | POA: Diagnosis not present

## 2016-03-18 DIAGNOSIS — R482 Apraxia: Secondary | ICD-10-CM | POA: Diagnosis not present

## 2016-03-18 DIAGNOSIS — R471 Dysarthria and anarthria: Secondary | ICD-10-CM | POA: Diagnosis not present

## 2016-03-18 NOTE — Therapy (Signed)
[facility] [address] [name], [place], [address] Phone: [phone]   Fax:  [phone]  Occupational Therapy Treatment  Patient Details  Name: Isaiah [name] [facility] MRN: [id number] Date of Birth: 1966/06/26 Referring Provider: Alysia Penna  Encounter Date: 03/18/2016      OT End of Session - 03/18/16 1429    Visit Number 5   Number of Visits 17   Date for OT Re-Evaluation 05/01/16   Authorization Type medicare   Authorization Time Period "$20 co pay, no visit limit, no auth required"   Authorization - Visit Number 5   Authorization - Number of Visits 10   OT Start Time 1017   OT Stop Time 1059   OT Time Calculation (min) 42 min   Activity Tolerance Patient tolerated treatment well      Past Medical History  Diagnosis Date  . Diabetes mellitus without complication ([place])   . Sickle cell-thalassemia disease ([place])     a. Sickle cell trait.  . Pulmonary embolism ([place]) 07/2011    a. Tx with Coumadin for 6 months (unknown cause per patient).  . Hyperkalemia 07/2011  . Acute renal failure ([place]) 07/2011  . Monoclonal gammopathy   . Sleep apnea     a. uses CPAP.  [name] Hypertension   . Gout   . Pulmonary embolism ([place]) 07/2011; 09/27/2014    a. Bilat PE 07/2011 - unclear cause, tx with 6 months Coumadin.;   . Monoclonal gammopathies   . Anemia   . Asthma   . OSA on CPAP   . History of recent blood transfusion 10/27/14    2 Units PRBC's  . ESRD (end stage renal disease) on dialysis ([place])   . Stroke ([place])   . A-fib [name])     Past Surgical History  Procedure Laterality Date  . Cholecystectomy    . Bone marrow biopsy    . Other surgical history      Retinal surgery  . Pars plana vitrectomy  02/17/2012    Procedure: PARS PLANA VITRECTOMY WITH 25 GAUGE;  Surgeon: Hayden Pedro, MD;  Location: [place];  Service: Ophthalmology;  Laterality: Right;  . Cholecystectomy  1990's?  . Eye surgery Right   . Insertion of  dialysis catheter Right 10/16/2015    Procedure: INSERTION OF PALINDROME DIALYSIS CATHETER ;  Surgeon: Elam Dutch, MD;  Location: [place];  Service: Vascular;  Laterality: Right;  . Av fistula placement Right 12/05/2015    Procedure: INSERTION OF ARTERIOVENOUS (AV) GORE-TEX GRAFT ARM;  Surgeon: Elam Dutch, MD;  Location: [place];  Service: Vascular;  Laterality: Right;    There were no vitals filed for this visit.      Subjective Assessment - 03/18/16 1023    Subjective  I brought my shoe buttons.    Pertinent History see Epic    Patient Stated Goals to use right hand better   Currently in Pain? Yes   Pain Score 2    Pain Location Back   Pain Orientation Right;Left   Pain Descriptors / Indicators Aching   Pain Type Acute pain   Pain Onset Yesterday   Pain Frequency Intermittent   Aggravating Factors  not sure I think it just strained it somehow   Pain Relieving Factors exercise, asper-cream                      OT Treatments/Exercises (OP) - 03/18/16 0001    ADLs  LB Dressing Pt purchased shoe buttons and brought in for assistance in setting them up. Set up shoes and had pt practice donning and doffing. Pt needed mod cues on how to slip shoe lace on and off button.  WIth pratice pt able to do mod I.    Neurological Re-education Exercises   Other Exercises 1 Neuro re ed to address more fine motor use of R hand - attempted 9 hole peg - pt unable to put even one peg in with R hand -difficulty picking up peg and then orienting peg to hole (in hand manipulation).  L hand = 35.83;  feel cognition and sensory impact performance on this standardized test as well. Also addressed high reach with proximal strength - pt now able to lift 3 pound object up to and off from high shelf with RUE.                   OT Short Term Goals - 03/18/16 1427    OT SHORT TERM GOAL #1   Title I with initiall HEP- 03/20/2016   Time 4   Period Weeks   Status Achieved   OT SHORT  TERM GOAL #2   Title Pt will perform all basic ADLS with supervision.   Time 4   Period Weeks   Status Achieved   OT SHORT TERM GOAL #3   Title Patient will demonstrate improved functional strength in right UE as evidenced by brushing teeth with dominant right hand   Time 4   Period Weeks   Status Achieved   OT SHORT TERM GOAL #4   Title Pt will demonstrate ability to retrieve a light weight (less than 1 lb) object at 120 shoulder flexion with RUE.   Time 4   Period Weeks   Status Achieved  3 pound object   OT SHORT TERM GOAL #5   Title Patient will demostrate improved coordination for right hand as evidenced by 15 on box and blocks test   Time 4   Period Weeks   Status Achieved  25 on 03/12/2016   OT SHORT TERM GOAL #6   Title Pt will report feeding himself with RUE 75% of the time.   Time 4   Period Weeks   Status Achieved           OT Long Term Goals - 03/18/16 1427    OT LONG TERM GOAL #1   Title I with updated HEP.- 04/17/2016   Time 8   Period Weeks   Status On-going   OT LONG TERM GOAL #2   Title Pt will demonstrate ability to write his name legibily.   Time 8   Period Weeks   Status On-going   OT LONG TERM GOAL #3   Title Patient will demonstrate adequate strength and coordination in dominant right hand to tighten laces and tie shoes using bilateral technique   Time 8   Period Weeks   Status On-going   OT LONG TERM GOAL #4   Title Assess 9 hole peg test and determine goal   Status Achieved  pt unable to do 1 in 1 minute time    OT LONG TERM GOAL #5   Title Pt will demonstrate RUE grip strength of 20+ lbs for increased functional use.   Time 8   Period Weeks   Status On-going  12 pounds on 03/18/2016 using 3rd notch   OT LONG TERM GOAL #6   Title Pt will perform all basic ADLs  and simple meal prep modified independently.   Time 8   Period Weeks   Status On-going               Plan - 03/18/16 1428    Clinical Impression Statement Pt  progressing toward goals and has met all STG's. Pt able to 100% of his meals in last 24 hours.   Rehab Potential Good   OT Frequency 2x / week   OT Duration 8 weeks   OT Treatment/Interventions Self-care/ADL training;Therapeutic exercise;Patient/family education;Balance training;Splinting;Manual Therapy;Neuromuscular education;Ultrasound;Therapeutic exercises;Therapeutic activities;DME and/or AE instruction;Parrafin;Cryotherapy;Electrical Stimulation;Fluidtherapy;Scar mobilization;Cognitive remediation/compensation;Moist Heat;Contrast Bath;Passive range of motion;Visual/perceptual remediation/compensation;Functional Mobility Training   Plan NMR for RUE, coordination tasks with R hand, grip strength, try simple handwriting   OT Home Exercise Plan Education issued:  12/31/15 initial HEP (coordination, cane ex);  added putty on 02/26/2016   Consulted and Agree with Plan of Care Patient      Patient will benefit from skilled therapeutic intervention in order to improve the following deficits and impairments:  Cardiopulmonary status limiting activity, Decreased activity tolerance, Decreased balance, Decreased cognition, Decreased endurance, Decreased coordination, Decreased strength, Increased edema, Impaired tone, Impaired UE functional use, Impaired vision/preception  Visit Diagnosis: Apraxia  Muscle weakness (generalized)  Other abnormalities of gait and mobility  Other lack of coordination  Cognitive social or emotional deficit following cerebral infarction    Problem List Patient Active Problem List   Diagnosis Date Noted  . Hemiparesis affecting right side as late effect of cerebrovascular accident ([place]) 02/28/2016  . Aphasia, late effect of cerebrovascular disease 02/28/2016  . Acute encephalopathy 01/08/2016  . Seizures ([place]) 01/08/2016  . History of ischemic left MCA stroke 01/08/2016  . Right sided weakness 01/08/2016  . Chronic combined systolic and diastolic CHF (congestive  heart failure) ([place]) 01/08/2016  . Atrial fibrillation ([place]) 01/08/2016  . Leukocytosis 01/08/2016  . Benign essential HTN 01/08/2016  . Anemia of chronic disease 01/08/2016  . Controlled type 2 diabetes mellitus with diabetic nephropathy, without long-term current use of insulin ([place]) 01/08/2016  . History of DVT (deep vein thrombosis) 01/08/2016  . ESRD (end stage renal disease) on dialysis [facility])     Quay Burow, OTR/L 03/18/2016, 2:31 PM  [facility] [address] [place], [place], [address] Phone: [phone]   Fax:  [phone]  Name: Andrews Tener [facility] MRN: [id number] Date of Birth: 28-Mar-1966

## 2016-03-18 NOTE — Patient Instructions (Signed)
Side-Stepping    Tie resisted band just above knees, and walk to left side with eyes open. Take even steps, leading with same foot. Make sure each foot lifts off the floor. Make sure to get your balance when you briefly stop.  Repeat in opposite direction. Repeat for _10-15___ feet per session. Do _1-2__ sessions per day. Be near counter for support.  Copyright  VHI. All rights reserved.

## 2016-03-19 ENCOUNTER — Ambulatory Visit: Payer: 59

## 2016-03-19 DIAGNOSIS — E1129 Type 2 diabetes mellitus with other diabetic kidney complication: Secondary | ICD-10-CM | POA: Diagnosis not present

## 2016-03-19 DIAGNOSIS — D688 Other specified coagulation defects: Secondary | ICD-10-CM | POA: Diagnosis not present

## 2016-03-19 DIAGNOSIS — D509 Iron deficiency anemia, unspecified: Secondary | ICD-10-CM | POA: Diagnosis not present

## 2016-03-19 DIAGNOSIS — D631 Anemia in chronic kidney disease: Secondary | ICD-10-CM | POA: Diagnosis not present

## 2016-03-19 DIAGNOSIS — T8249XA Other complication of vascular dialysis catheter, initial encounter: Secondary | ICD-10-CM | POA: Diagnosis not present

## 2016-03-19 DIAGNOSIS — N2581 Secondary hyperparathyroidism of renal origin: Secondary | ICD-10-CM | POA: Diagnosis not present

## 2016-03-19 DIAGNOSIS — N186 End stage renal disease: Secondary | ICD-10-CM | POA: Diagnosis not present

## 2016-03-19 NOTE — Therapy (Signed)
[facility] [address] [place], [place], [address] Phone: [phone]   Fax:  [phone]  Physical Therapy Treatment  Patient Details  Name: Isaiah [name] [facility] MRN: [id number] Date of Birth: Feb 11, 1966 Referring Provider: Dr. Letta Pate  Encounter Date: 03/18/2016      PT End of Session - 03/19/16 1711    Visit Number 7   Number of Visits 17   Date for PT Re-Evaluation 04/22/16  per PT eval 02/21/16-[place]   Authorization Type BCBS   PT Start Time 0933   PT Stop Time 1016   PT Time Calculation (min) 43 min   Equipment Utilized During Treatment Gait belt   Activity Tolerance Patient tolerated treatment well   Behavior During Therapy [facility] for tasks assessed/performed      Past Medical History  Diagnosis Date  . Diabetes mellitus without complication ([place])   . Sickle cell-thalassemia disease ([place])     a. Sickle cell trait.  . Pulmonary embolism ([place]) 07/2011    a. Tx with Coumadin for 6 months (unknown cause per patient).  . Hyperkalemia 07/2011  . Acute renal failure ([place]) 07/2011  . Monoclonal gammopathy   . Sleep apnea     a. uses CPAP.  [name] Hypertension   . Gout   . Pulmonary embolism ([place]) 07/2011; 09/27/2014    a. Bilat PE 07/2011 - unclear cause, tx with 6 months Coumadin.;   . Monoclonal gammopathies   . Anemia   . Asthma   . OSA on CPAP   . History of recent blood transfusion 10/27/14    2 Units PRBC's  . ESRD (end stage renal disease) on dialysis ([place])   . Stroke ([place])   . A-fib [facility])     Past Surgical History  Procedure Laterality Date  . Cholecystectomy    . Bone marrow biopsy    . Other surgical history      Retinal surgery  . Pars plana vitrectomy  02/17/2012    Procedure: PARS PLANA VITRECTOMY WITH 25 GAUGE;  Surgeon: Hayden Pedro, MD;  Location: [place];  Service: Ophthalmology;  Laterality: Right;  . Cholecystectomy  1990's?  . Eye surgery Right   . Insertion of dialysis catheter Right  10/16/2015    Procedure: INSERTION OF PALINDROME DIALYSIS CATHETER ;  Surgeon: Elam Dutch, MD;  Location: [place];  Service: Vascular;  Laterality: Right;  . Av fistula placement Right 12/05/2015    Procedure: INSERTION OF ARTERIOVENOUS (AV) GORE-TEX GRAFT ARM;  Surgeon: Elam Dutch, MD;  Location: [facility];  Service: Vascular;  Laterality: Right;    There were no vitals filed for this visit.      Subjective Assessment - 03/18/16 0938    Subjective Slept funny perhaps and have a little back pain in left low back.   Pertinent History Seizure 12/2015; DVT, PE-has IVC filter in placed, DM, CKD-stage V on dialysis, sickle cell anemia, OSA, asthma, HTN, tachycardia in hospital (12/06/15), AVF    Patient Stated Goals Pt's goal for therapy is to get back to full range of motion and return to work.   Currently in Pain? Yes   Pain Score 2    Pain Location Back   Pain Orientation Left;Lower   Pain Descriptors / Indicators Aching   Pain Type Acute pain   Pain Onset Today   Pain Frequency Constant   Aggravating Factors  slept on it funny   Pain Relieving Factors asper-cream  [place] Adult PT Treatment/Exercise - 03/19/16 1705    Ambulation/Gait   Ambulation/Gait Yes   Ambulation/Gait Assistance 5: Supervision   Ambulation/Gait Assistance Details Short distance gait between activities in gym area, with cues for deliberate heelstrike and foot clearance   Ambulation Distance (Feet) 100 Feet  x 2   Assistive device None   Gait Pattern Step-through pattern;Decreased dorsiflexion - right;Poor foot clearance - right   Ambulation Surface Level;Indoor   Pre-Gait Activities At 6" and 12" step, worked on forward step taps, then back step and weightshift, with cues to focus on timing and effort with hip flexion to bring RLE forward.  Forward/backward step and weightshift, with focus on timing of hip flexion to aid in improved R foot clearance.   High Level Balance    High Level Balance Comments Toe walking x 10 ft, 2 reps, then heelwalking 10 ft, 2 sets along counter.   Exercises   Other Exercises  Sidestep squats, then resisted sidestepping 3 x 15 feet each direction, with blue theraband, for improved hip strength.   Knee/Hip Exercises: Aerobic   Nustep Scifit level 3.0 all 4 extremities x 5 minutes with rpm>60  for lower extremity strengthening   Knee/Hip Exercises: Machines for Strengthening   Total Gym Leg Press bil LE 70# 2 sets x 20;RLE only 55# x 20 x 2 sets      Neuro Re-education:  Continued:  Standing in corner with chair for support:  Anterior/posterior weight shifting, x 12 reps each, ankle strategy (up on toes and back on heels), and wall bumps for hip strategy.           PT Education - 03/19/16 1710    Education provided Yes   Education Details Added resisted sidestepping to HEP   Person(s) Educated Patient   Methods Explanation;Demonstration;Handout   Comprehension Verbalized understanding;Returned demonstration          PT Short Term Goals - 02/22/16 1243    PT SHORT TERM GOAL #1   Title Pt will perform HEP independenlty for improved strength, balance and gait.  TARGET 03/23/16   Time 4   Period Weeks   Status New   PT SHORT TERM GOAL #2   Title Pt will improve Berg Score to at least 49/56 for decreased fall risk.   Time 4   Period Weeks   Status New   PT SHORT TERM GOAL #3   Title Pt will improve TUG score to less than or equal to 13.5 seconds for decreased fall risk.   Time 4   Period Weeks   Status New   PT SHORT TERM GOAL #4   Title Pt will amb. 300' over even/uneven terrain with LRAD at MOD I level.   Status New           PT Long Term Goals - 02/22/16 1245    PT LONG TERM GOAL #1   Title Pt will verbalize understanding of fall prevention in the home environment.  TARGET 04/22/16   Time 8   Period Weeks   Status New   PT LONG TERM GOAL #2   Title Pt will improve Dynamic Gait Index score to at  least 19/24 for decreased fall risk.   Time 8   Period Weeks   Status New   PT LONG TERM GOAL #3   Title Pt will improve single limb stance to at least 3 seconds each leg for improved negotiation of steps and obstacles.   Time 8   Period  Weeks   Status New   PT LONG TERM GOAL #4   Title Pt will amb. 600' over even/uneven terrain IND without AD to improve functional moblity.    Time 8   Period Weeks   Status New   PT LONG TERM GOAL #5   Title Pt will ascend/descent 12 steps with one handrail at MOD I level to negotiate steps at home.   Time 8   Period Weeks   Status New               Plan - 03/19/16 1712    Clinical Impression Statement Pt continues to need cues for increased timing and coordination of R foot clearance and heelstrike with gait.  Pt has difficulty carrying over balance activities for this focus into gait changes.  Pt will continue to benefit from further skilled PT to improve gait and balance.   Rehab Potential Good   Clinical Impairments Affecting Rehab Potential co-morbidities   PT Frequency 2x / week   PT Duration 8 weeks   PT Treatment/Interventions ADLs/Self Care Home Management;Therapeutic exercise;Therapeutic activities;Functional mobility training;Gait training;Balance training;Neuromuscular re-education;Patient/family education   PT Next Visit Plan Try treadmill gait training to work on improved RLE foot clearance and heelstrike; check STGS.   Consulted and Agree with Plan of Care Patient      Patient will benefit from skilled therapeutic intervention in order to improve the following deficits and impairments:  Abnormal gait, Decreased balance, Decreased mobility, Decreased strength, Difficulty walking, Postural dysfunction  Visit Diagnosis: Muscle weakness (generalized)  Other abnormalities of gait and mobility     Problem List Patient Active Problem List   Diagnosis Date Noted  . Hemiparesis affecting right side as late effect of  cerebrovascular accident ([place]) 02/28/2016  . Aphasia, late effect of cerebrovascular disease 02/28/2016  . Acute encephalopathy 01/08/2016  . Seizures ([place]) 01/08/2016  . History of ischemic left MCA stroke 01/08/2016  . Right sided weakness 01/08/2016  . Chronic combined systolic and diastolic CHF (congestive heart failure) ([place]) 01/08/2016  . Atrial fibrillation ([place]) 01/08/2016  . Leukocytosis 01/08/2016  . Benign essential HTN 01/08/2016  . Anemia of chronic disease 01/08/2016  . Controlled type 2 diabetes mellitus with diabetic nephropathy, without long-term current use of insulin ([place]) 01/08/2016  . History of DVT (deep vein thrombosis) 01/08/2016  . ESRD (end stage renal disease) on dialysis [name])     Aluel Schwarz W. 03/19/2016, 5:15 PM  Frazier Butt., PT  [facility] [address] [place], [place], [address] Phone: [phone]   Fax:  [phone]  Name: Isaiah [name] [facility] MRN: [id number] Date of Birth: 07-26-1966

## 2016-03-20 ENCOUNTER — Encounter: Payer: Self-pay | Admitting: Occupational Therapy

## 2016-03-20 ENCOUNTER — Encounter (HOSPITAL_COMMUNITY): Payer: Self-pay | Admitting: Vascular Surgery

## 2016-03-20 ENCOUNTER — Encounter (HOSPITAL_COMMUNITY)
Admission: RE | Disposition: A | Discharge status: Home / Self Care | Payer: Self-pay | Source: Ambulatory Visit | Attending: Vascular Surgery

## 2016-03-20 ENCOUNTER — Ambulatory Visit (HOSPITAL_COMMUNITY)
Admission: RE | Admit: 2016-03-20 | Discharge: 2016-03-20 | Disposition: A | Discharge status: Home / Self Care | Payer: 59 | Source: Ambulatory Visit | Attending: Vascular Surgery | Admitting: Vascular Surgery

## 2016-03-20 ENCOUNTER — Other Ambulatory Visit: Payer: Self-pay | Admitting: *Deleted

## 2016-03-20 ENCOUNTER — Ambulatory Visit: Payer: Self-pay | Admitting: Physical Therapy

## 2016-03-20 DIAGNOSIS — T82898A Other specified complication of vascular prosthetic devices, implants and grafts, initial encounter: Secondary | ICD-10-CM | POA: Diagnosis not present

## 2016-03-20 DIAGNOSIS — N186 End stage renal disease: Secondary | ICD-10-CM | POA: Insufficient documentation

## 2016-03-20 DIAGNOSIS — J45909 Unspecified asthma, uncomplicated: Secondary | ICD-10-CM | POA: Insufficient documentation

## 2016-03-20 DIAGNOSIS — Z86711 Personal history of pulmonary embolism: Secondary | ICD-10-CM | POA: Diagnosis not present

## 2016-03-20 DIAGNOSIS — I120 Hypertensive chronic kidney disease with stage 5 chronic kidney disease or end stage renal disease: Secondary | ICD-10-CM | POA: Insufficient documentation

## 2016-03-20 DIAGNOSIS — E1122 Type 2 diabetes mellitus with diabetic chronic kidney disease: Secondary | ICD-10-CM | POA: Diagnosis not present

## 2016-03-20 DIAGNOSIS — Z09 Encounter for follow-up examination after completed treatment for conditions other than malignant neoplasm: Secondary | ICD-10-CM | POA: Diagnosis not present

## 2016-03-20 DIAGNOSIS — Z7901 Long term (current) use of anticoagulants: Secondary | ICD-10-CM | POA: Insufficient documentation

## 2016-03-20 DIAGNOSIS — D573 Sickle-cell trait: Secondary | ICD-10-CM | POA: Diagnosis not present

## 2016-03-20 DIAGNOSIS — Z7982 Long term (current) use of aspirin: Secondary | ICD-10-CM | POA: Diagnosis not present

## 2016-03-20 DIAGNOSIS — Z794 Long term (current) use of insulin: Secondary | ICD-10-CM | POA: Diagnosis not present

## 2016-03-20 DIAGNOSIS — Z992 Dependence on renal dialysis: Secondary | ICD-10-CM | POA: Insufficient documentation

## 2016-03-20 DIAGNOSIS — I4891 Unspecified atrial fibrillation: Secondary | ICD-10-CM | POA: Diagnosis not present

## 2016-03-20 DIAGNOSIS — Z4931 Encounter for adequacy testing for hemodialysis: Secondary | ICD-10-CM

## 2016-03-20 DIAGNOSIS — Z79899 Other long term (current) drug therapy: Secondary | ICD-10-CM | POA: Insufficient documentation

## 2016-03-20 DIAGNOSIS — Z8673 Personal history of transient ischemic attack (TIA), and cerebral infarction without residual deficits: Secondary | ICD-10-CM | POA: Diagnosis not present

## 2016-03-20 DIAGNOSIS — Z7951 Long term (current) use of inhaled steroids: Secondary | ICD-10-CM | POA: Diagnosis not present

## 2016-03-20 DIAGNOSIS — G4733 Obstructive sleep apnea (adult) (pediatric): Secondary | ICD-10-CM | POA: Diagnosis not present

## 2016-03-20 HISTORY — PX: PERIPHERAL VASCULAR CATHETERIZATION: SHX172C

## 2016-03-20 LAB — POCT I-STAT, CHEM 8
BUN: 30 mg/dL — ABNORMAL HIGH (ref 6–20)
Calcium, Ion: 1.07 mmol/L — ABNORMAL LOW (ref 1.13–1.30)
Chloride: 96 mmol/L — ABNORMAL LOW (ref 101–111)
Creatinine, Ser: 6.3 mg/dL — ABNORMAL HIGH (ref 0.61–1.24)
Glucose, Bld: 150 mg/dL — ABNORMAL HIGH (ref 65–99)
HCT: 34 % — ABNORMAL LOW (ref 39.0–52.0)
Hemoglobin: 11.6 g/dL — ABNORMAL LOW (ref 13.0–17.0)
Potassium: 3.3 mmol/L — ABNORMAL LOW (ref 3.5–5.1)
Sodium: 138 mmol/L (ref 135–145)
TCO2: 29 mmol/L (ref 0–100)

## 2016-03-20 LAB — PROTIME-INR
INR: 1.16 (ref 0.00–1.49)
Prothrombin Time: 15 seconds (ref 11.6–15.2)

## 2016-03-20 SURGERY — A/V SHUNTOGRAM/FISTULAGRAM
Anesthesia: LOCAL

## 2016-03-20 MED ORDER — SODIUM CHLORIDE 0.9 % IV SOLN: 250.0000 mL | INTRAVENOUS | Status: DC | PRN

## 2016-03-20 MED ORDER — HEPARIN (PORCINE) IN NACL 2-0.9 UNIT/ML-% IJ SOLN
INTRAMUSCULAR | Status: DC | PRN
  Administered 2016-03-20: 500 mL

## 2016-03-20 MED ORDER — SODIUM CHLORIDE 0.9% FLUSH: 3.0000 mL | INTRAVENOUS | Status: DC | PRN

## 2016-03-20 MED ORDER — LIDOCAINE HCL (PF) 1 % IJ SOLN
INTRAMUSCULAR | Status: AC
  Filled 2016-03-20: qty 30

## 2016-03-20 MED ORDER — HEPARIN SODIUM (PORCINE) 1000 UNIT/ML IJ SOLN
INTRAMUSCULAR | Status: DC | PRN
  Administered 2016-03-20: 3000 [IU] via INTRAVENOUS

## 2016-03-20 MED ORDER — IODIXANOL 320 MG/ML IV SOLN
INTRAVENOUS | Status: DC | PRN
  Administered 2016-03-20: 60 mL via INTRA_ARTERIAL

## 2016-03-20 MED ORDER — SODIUM CHLORIDE 0.9% FLUSH: 3.0000 mL | Freq: Two times a day (BID) | INTRAVENOUS | Status: DC

## 2016-03-20 MED ORDER — ACETAMINOPHEN 325 MG PO TABS: 650.0000 mg | ORAL_TABLET | ORAL | Status: DC | PRN

## 2016-03-20 MED ORDER — LIDOCAINE HCL (PF) 1 % IJ SOLN
INTRAMUSCULAR | Status: DC | PRN
  Administered 2016-03-20: 2 mL via SUBCUTANEOUS

## 2016-03-20 MED ORDER — HEPARIN (PORCINE) IN NACL 2-0.9 UNIT/ML-% IJ SOLN
INTRAMUSCULAR | Status: AC
  Filled 2016-03-20: qty 500

## 2016-03-20 MED ORDER — HEPARIN SODIUM (PORCINE) 1000 UNIT/ML IJ SOLN
INTRAMUSCULAR | Status: AC
  Filled 2016-03-20: qty 1

## 2016-03-20 SURGICAL SUPPLY — 15 items
BAG SNAP BAND KOVER 36X36 (MISCELLANEOUS) ×2 IMPLANT
BALLN ARMADA 6X100X80 (BALLOONS) ×2 IMPLANT
COVER DOME SNAP 22 D (MISCELLANEOUS) ×2 IMPLANT
COVER PRB 48X5XTLSCP FOLD TPE (BAG) ×1 IMPLANT
COVER PROBE 5X48 (BAG) ×1
KIT ENCORE 26 ADVANTAGE (KITS) ×2 IMPLANT
KIT MICROINTRODUCER STIFF 5F (SHEATH) ×2 IMPLANT
PROTECTION STATION PRESSURIZED (MISCELLANEOUS) ×2
SHEATH PINNACLE 7F 10CM (SHEATH) ×2 IMPLANT
SHEATH PINNACLE R/O II 6F 4CM (SHEATH) ×4 IMPLANT
STATION PROTECTION PRESSURIZED (MISCELLANEOUS) ×1 IMPLANT
STOPCOCK MORSE 400PSI 3WAY (MISCELLANEOUS) ×4 IMPLANT
TRAY PV CATH (CUSTOM PROCEDURE TRAY) ×2 IMPLANT
TUBING CIL FLEX 10 FLL-RA (TUBING) ×2 IMPLANT
WIRE BENTSON .035X145CM (WIRE) ×2 IMPLANT

## 2016-03-20 NOTE — Interval H&P Note (Signed)
History and Physical Interval Note:  03/20/2016 7:12 AM  Hagerman  has presented today for surgery, with the diagnosis of ESRD  The various methods of treatment have been discussed with the patient and family. After consideration of risks, benefits and other options for treatment, the patient has consented to  Procedure(s): A/V Shuntogram/Fistulagram (N/A) as a surgical intervention .  The patient's history has been reviewed, patient examined, no change in status, stable for surgery.  I have reviewed the patient's chart and labs.  Questions were answered to the patient's satisfaction.     Adele Barthel

## 2016-03-20 NOTE — Discharge Instructions (Signed)
Fistulogram, Care After Refer to this sheet in the next few weeks. These instructions provide you with information on caring for yourself after your procedure. Your health care provider may also give you more specific instructions. Your treatment has been planned according to current medical practices, but problems sometimes occur. Call your health care provider if you have any problems or questions after your procedure. WHAT TO EXPECT AFTER THE PROCEDURE After your procedure, it is typical to have the following:  A small amount of discomfort in the area where the catheters were placed.  A small amount of bruising around the fistula.  Sleepiness and fatigue. HOME CARE INSTRUCTIONS  Rest at home for the day following your procedure.  Do not drive or operate heavy machinery while taking pain medicine.  Take medicines only as directed by your health care provider.  Do not take baths, swim, or use a hot tub until your health care provider approves. You may shower 24 hours after the procedure or as directed by your health care provider.  There are many different ways to close and cover an incision, including stitches, skin glue, and adhesive strips. Follow your health care provider's instructions on:  Incision care.  Bandage (dressing) changes and removal.  Incision closure removal.  Monitor your dialysis fistula carefully. SEEK MEDICAL CARE IF:  You have drainage, redness, swelling, or pain at your catheter site.  You have a fever.  You have chills. SEEK IMMEDIATE MEDICAL CARE IF:  You feel weak.  You have trouble balancing.  You have trouble moving your arms or legs.  You have problems with your speech or vision.  You can no longer feel a vibration or buzz when you put your fingers over your dialysis fistula.  The limb that was used for the procedure:  Swells.  Is painful.  Is cold.  Is discolored, such as blue or pale white.   This information is not intended  to replace advice given to you by your health care provider. Make sure you discuss any questions you have with your health care provider.   Document Released: 01/09/2014 Document Reviewed: 01/09/2014 Elsevier Interactive Patient Education [employer].

## 2016-03-20 NOTE — H&P (View-Only) (Signed)
Vascular and Vein Specialist of [name]  Patient name: Isaiah [name] [facility] MRN: [id number] DOB: [birth year] Sex: male  REASON FOR VISIT: Follow-up  HPI: Isaiah [name] is a 50 y.o. male who is here for continued follow-up of his right radial cephalic AV fistula placed on 12/05/2015. He is currently dialyzing on Mondays, Wednesdays and Fridays via a left IJ tunneled dialysis catheter. He was last seen in the office on 01/17/2016. At that time his fistula was just under 5 mm. He was advised to follow-up in 6 weeks.  The patient recently suffered a stroke and has been attending outpatient rehabilitation. He is continuing to improve. He is on Coumadin for atrial fibrillation. He has required bridging in the past for surgery.  Past Medical History  Diagnosis Date  . Diabetes mellitus without complication ([place])   . Sickle cell-thalassemia disease ([place])     a. Sickle cell trait.  . Pulmonary embolism ([place]) 07/2011    a. Tx with Coumadin for 6 months (unknown cause per patient).  . Hyperkalemia 07/2011  . Acute renal failure ([place]) 07/2011  . Monoclonal gammopathy   . Sleep apnea     a. uses CPAP.  [name] Hypertension   . Gout   . Pulmonary embolism ([place]) 07/2011; 09/27/2014    a. Bilat PE 07/2011 - unclear cause, tx with 6 months Coumadin.;   . Monoclonal gammopathies   . Anemia   . Asthma   . OSA on CPAP   . History of recent blood transfusion 10/27/14    2 Units PRBC's  . ESRD (end stage renal disease) on dialysis ([place])   . Stroke ([place])   . A-fib [name])     Family History  Problem Relation Age of Onset  . Hypertension Mother   . Diabetes Mother   . Sickle cell anemia Brother   . Diabetes type I Brother   . Kidney disease Brother   . Stroke Father     SOCIAL HISTORY: Social History  Substance Use Topics  . Smoking status: Never Smoker   . Smokeless tobacco: Never Used  . Alcohol Use: No    Allergies  Allergen Reactions  . Codeine Rash and Other (See Comments)    Unknown  reaction (patient says it was more serious than just a rash, but he can't remember what happened) Unknown reaction (patient says it was more serious than just a rash, but he can't remember what happened)    Current Outpatient Prescriptions  Medication Sig Dispense Refill  . aspirin EC 81 MG tablet Take 1 tablet (81 mg total) by mouth daily. 100 tablet 0  . epoetin alfa (EPOGEN,PROCRIT) 86754 UNIT/ML injection Inject 5,000 Units into the skin once a week. Reported on 01/17/2016    . fluticasone furoate-vilanterol (BREO ELLIPTA) 100-25 MCG/INH AEPB Inhale 1 puff into the lungs daily. 60 each 1  . Insulin Glargine (LANTUS) 100 UNIT/ML Solostar Pen Inject 10 Units into the skin daily at 10 pm. 15 mL 1  . Lacosamide (VIMPAT) 100 MG TABS Take 1 tablet (100 mg total) by mouth 2 (two) times daily. 28 tablet 0  . lacosamide 100 MG TABS Take 1 tablet (100 mg total) by mouth 2 (two) times daily. 60 tablet 0  . metoprolol tartrate (LOPRESSOR) 25 MG tablet Take 0.5 tablets (12.5 mg total) by mouth 2 (two) times daily. 60 tablet 0  . midodrine (PROAMATINE) 2.5 MG tablet Take 1 tablet (2.5 mg total) by mouth 2 (two) times daily with a meal. 60  tablet 1  . multivitamin (RENA-VIT) TABS tablet Take 1 tablet by mouth at bedtime. 30 tablet 1  . NEEDLE, DISP, 27 G (BD DISP NEEDLES) 27G X 1/2" MISC 1 application by Does not apply route daily after supper. 100 each 0  . pantoprazole (PROTONIX) 40 MG tablet Take 1 tablet (40 mg total) by mouth 2 (two) times daily. 60 tablet 1  . SENSIPAR 60 MG tablet Take 60 mg by mouth daily.  10  . warfarin (COUMADIN) 5 MG tablet Take 1.5 tablets (7.5 mg total) by mouth daily at 6 PM. 45 tablet 1   No current facility-administered medications for this visit.    REVIEW OF SYSTEMS:  [X]  denotes positive finding, [ ]  denotes negative finding Cardiac  Comments:  Chest pain or chest pressure:    Shortness of breath upon exertion:    Short of breath when lying flat:    Irregular  heart rhythm:        Vascular    Pain in calf, thigh, or hip brought on by ambulation:    Pain in feet at night that wakes you up from your sleep:     Blood clot in your veins:    Leg swelling:         Pulmonary    Oxygen at home:    Productive cough:     Wheezing:         Neurologic    Sudden weakness in arms or legs:     Sudden numbness in arms or legs:     Sudden onset of difficulty speaking or slurred speech:    Temporary loss of vision in one eye:     Problems with dizziness:         Gastrointestinal    Blood in stool:     Vomited blood:         Genitourinary    Burning when urinating:     Blood in urine:        Psychiatric    Major depression:         Hematologic    Bleeding problems:    Problems with blood clotting too easily:        Skin    Rashes or ulcers:        Constitutional    Fever or chills:      PHYSICAL EXAM: Filed Vitals:   03/06/16 1607  BP: 119/75  Pulse: 64  Temp: 97.5 F (36.4 C)  Resp: 14  Height: 5' 10"  (1.778 m)  Weight: 211 lb (95.709 kg)  SpO2: 99%    GENERAL: The patient is a well-nourished male, in no acute distress. The vital signs are documented above. VASCULAR: Palpable thrill right forearm fistula. Right arm incision well healed. PULMONARY: Nonlabored respiratory effort. NEUROLOGIC: Some speech stuttering. PSYCHIATRIC: The patient has a normal affect.  DATA:  Dialysis fistula duplex 03/06/2016  Fistula is 0.56 cm at the largest diameter. Depth is less then 4 mm. Area of narrowing distally.  MEDICAL ISSUES:  ESRD on HD   Patient is currently dialyzing via a left IJ tunneled dialysis catheter on Mondays, Wednesdays and Fridays. His right forearm fistula has not yet matured and is just under 6 cm. There is an area of narrowing distally. This will need to be evaluated with a fistulogram with possible intervention. He is on Coumadin for atrial fibrillation. He will need to be bridged with Lovenox. This will be scheduled  for 03/20/2016 with Dr. Bridgett Larsson.  Virgina Jock,  PA-C [facility] Specialists of [place]   History and exam details as above. Fistula still slightly small. I believe this point he needs a fistulogram for further evaluation of this. We will need to bridge his Coumadin due to high risk of stroke.  Ruta Hinds, MD [facility] Vein Specialists of [place] Office: [phone] Pager: [phone]

## 2016-03-20 NOTE — Op Note (Addendum)
     OPERATIVE NOTE   PROCEDURE: 1.  right radiocephalic arteriovenous fistula cannulation under ultrasound guidance 2.  right arm fistuloogram 3.  Balloon assisted maturation procedure (Venoplasty of cephalic vein (6 mm x 254 mm))  PRE-OPERATIVE DIAGNOSIS: Poor maturation of right radiocephalic arteriovenous fistula  POST-OPERATIVE DIAGNOSIS: same as above   SURGEON: Adele Barthel, MD  ANESTHESIA: local  ESTIMATED BLOOD LOSS: 5 cc  FINDING(S): 1. Widely patent radiocephalic arteriovenous fistula 2. Widely patent central venous structures with internal jugular vein tunneled dialysis catheter in place 3. 5-5.5 mm forearm cephalic vein with tapered distal fistula: appropriate response to venoplasty  SPECIMEN(S):  None  CONTRAST: 60 cc  INDICATIONS: Isaiah [name] is a 49 y.o. male who presents with poor maturation of right radiocephalic arteriovenous fistula.  The patient is scheduled for right arm fistulogram.  The patient is aware the risks include but are not limited to: bleeding, infection, thrombosis of the cannulated access, and possible anaphylactic reaction to the contrast.  The patient is aware of the risks of the procedure and elects to proceed forward.  DESCRIPTION: After full informed written consent was obtained, the patient was brought back to the angiography suite and placed supine upon the angiography table.  The patient was connected to monitoring equipment.  The right forearm was prepped and draped in the standard fashion for a percutaneous access intervention.  Under ultrasound guidance, the right celiac artery arteriovenous fistula was cannulated with a micropuncture needle.  The microwire was advanced into the fistula and the needle was exchanged for the a microsheath, which was lodged 2 cm into the access.  The wire was removed and the sheath was connected to the IV extension tubing.  Hand injections were completed to image the access from the wrist up to the level  of central veins.  Based on the images, this patient will need: balloon assisted maturation procedure.  A Benson wire was advanced into the axillary vein and the sheath was exchanged for a short 6-Fr sheath.    Based on the imaging, a 6 mm x 100 mm angioplasty balloon was selected.  The balloon was inflated throughout the cephalic vein proximal to the access at 6 ATM for 2 minutes.  On completion imaging, there was evidence of some spasm.  I replaced the balloon and inflated it at 6 atm for 2 minutes at the areas of spasm.  On completion, the cephalic vein appeared to be >= 6 mm throughout.    Based on the completion imaging, no further intervention is necessary.  The wire and balloon were removed from the sheath.  A 4-0 Monocryl purse-string suture was sewn around the sheath.  The sheath was removed while tying down the suture.  A sterile bandage was applied to the puncture site.   COMPLICATIONS: none  CONDITION: stable   Adele Barthel, MD [facility] Vein Specialists of [place] Office: [phone] Pager: [phone]  03/20/2016 8:16 AM

## 2016-03-21 ENCOUNTER — Telehealth: Payer: Self-pay | Admitting: Vascular Surgery

## 2016-03-21 DIAGNOSIS — T8249XA Other complication of vascular dialysis catheter, initial encounter: Secondary | ICD-10-CM | POA: Diagnosis not present

## 2016-03-21 DIAGNOSIS — D631 Anemia in chronic kidney disease: Secondary | ICD-10-CM | POA: Diagnosis not present

## 2016-03-21 DIAGNOSIS — D509 Iron deficiency anemia, unspecified: Secondary | ICD-10-CM | POA: Diagnosis not present

## 2016-03-21 DIAGNOSIS — E1129 Type 2 diabetes mellitus with other diabetic kidney complication: Secondary | ICD-10-CM | POA: Diagnosis not present

## 2016-03-21 DIAGNOSIS — N2581 Secondary hyperparathyroidism of renal origin: Secondary | ICD-10-CM | POA: Diagnosis not present

## 2016-03-21 DIAGNOSIS — D688 Other specified coagulation defects: Secondary | ICD-10-CM | POA: Diagnosis not present

## 2016-03-21 DIAGNOSIS — N186 End stage renal disease: Secondary | ICD-10-CM | POA: Diagnosis not present

## 2016-03-21 MED FILL — RENA-VITE TABLET: 30 days supply | Qty: 30 | Fill #0

## 2016-03-21 NOTE — Telephone Encounter (Signed)
-----   Message from Mena Goes, RN sent at 03/20/2016 11:29 AM EDT ----- Regarding: schedule 2 weeks w/ duplex   ----- Message -----    From: Conrad Maria Antonia, MD    Sent: 03/20/2016   8:38 AM      To: [address]  [place] [id number] 12-29-1965  PROCEDURE: 1. right radiocephalic arteriovenous fistula cannulation under ultrasound guidance 2. right arm fistuloogram 3. Balloon assisted maturation procedure (Venoplasty of cephalic vein (6 mm x 890 mm))  Follow-up: 2 weeks w/ Dr. Oneida Alar or Tommie Sams) for follow-up: R forearm access duplex to check size

## 2016-03-21 NOTE — Telephone Encounter (Signed)
Spoke to pt's wife to sch appt 04/10/16; lab at 3:00 and MD at 3:45.

## 2016-03-24 ENCOUNTER — Encounter: Payer: Self-pay | Admitting: Physical Therapy

## 2016-03-24 ENCOUNTER — Ambulatory Visit: Payer: 59 | Admitting: Physical Therapy

## 2016-03-24 ENCOUNTER — Ambulatory Visit: Payer: 59

## 2016-03-24 DIAGNOSIS — N186 End stage renal disease: Secondary | ICD-10-CM | POA: Diagnosis not present

## 2016-03-24 DIAGNOSIS — R2681 Unsteadiness on feet: Secondary | ICD-10-CM

## 2016-03-24 DIAGNOSIS — R2689 Other abnormalities of gait and mobility: Secondary | ICD-10-CM

## 2016-03-24 DIAGNOSIS — R471 Dysarthria and anarthria: Secondary | ICD-10-CM | POA: Diagnosis not present

## 2016-03-24 DIAGNOSIS — D509 Iron deficiency anemia, unspecified: Secondary | ICD-10-CM | POA: Diagnosis not present

## 2016-03-24 DIAGNOSIS — M6281 Muscle weakness (generalized): Secondary | ICD-10-CM | POA: Diagnosis not present

## 2016-03-24 DIAGNOSIS — R278 Other lack of coordination: Secondary | ICD-10-CM | POA: Diagnosis not present

## 2016-03-24 DIAGNOSIS — R482 Apraxia: Secondary | ICD-10-CM | POA: Diagnosis not present

## 2016-03-24 DIAGNOSIS — I69315 Cognitive social or emotional deficit following cerebral infarction: Secondary | ICD-10-CM | POA: Diagnosis not present

## 2016-03-24 DIAGNOSIS — R279 Unspecified lack of coordination: Secondary | ICD-10-CM

## 2016-03-24 DIAGNOSIS — T8249XA Other complication of vascular dialysis catheter, initial encounter: Secondary | ICD-10-CM | POA: Diagnosis not present

## 2016-03-24 DIAGNOSIS — E1129 Type 2 diabetes mellitus with other diabetic kidney complication: Secondary | ICD-10-CM | POA: Diagnosis not present

## 2016-03-24 DIAGNOSIS — I69351 Hemiplegia and hemiparesis following cerebral infarction affecting right dominant side: Secondary | ICD-10-CM | POA: Diagnosis not present

## 2016-03-24 DIAGNOSIS — D688 Other specified coagulation defects: Secondary | ICD-10-CM | POA: Diagnosis not present

## 2016-03-24 DIAGNOSIS — R41841 Cognitive communication deficit: Secondary | ICD-10-CM

## 2016-03-24 NOTE — Therapy (Signed)
[facility] [address] [place], [place], [address] Phone: [phone]   Fax:  [phone]  Physical Therapy Treatment  Patient Details  Name: Isaiah [name] [facility] MRN: [id number] Date of Birth: Dec 03, 1965 Referring Provider: Dr. Letta Pate  Encounter Date: 03/24/2016      PT End of Session - 03/24/16 1437    Visit Number 8   Number of Visits 17   Date for PT Re-Evaluation 04/22/16   Authorization Type BCBS   PT Start Time 1106   PT Stop Time 1145   PT Time Calculation (min) 39 min   Equipment Utilized During Treatment Gait belt   Activity Tolerance Patient tolerated treatment well   Behavior During Therapy [facility] for tasks assessed/performed      Past Medical History  Diagnosis Date  . Diabetes mellitus without complication ([place])   . Sickle cell-thalassemia disease ([place])     a. Sickle cell trait.  . Pulmonary embolism ([place]) 07/2011    a. Tx with Coumadin for 6 months (unknown cause per patient).  . Hyperkalemia 07/2011  . Acute renal failure ([place]) 07/2011  . Monoclonal gammopathy   . Sleep apnea     a. uses CPAP.  [name] Hypertension   . Gout   . Pulmonary embolism ([place]) 07/2011; 09/27/2014    a. Bilat PE 07/2011 - unclear cause, tx with 6 months Coumadin.;   . Monoclonal gammopathies   . Anemia   . Asthma   . OSA on CPAP   . History of recent blood transfusion 10/27/14    2 Units PRBC's  . ESRD (end stage renal disease) on dialysis ([place])   . Stroke ([place])   . A-fib [facility])     Past Surgical History  Procedure Laterality Date  . Cholecystectomy    . Bone marrow biopsy    . Other surgical history      Retinal surgery  . Pars plana vitrectomy  02/17/2012    Procedure: PARS PLANA VITRECTOMY WITH 25 GAUGE;  Surgeon: Hayden Pedro, MD;  Location: [place];  Service: Ophthalmology;  Laterality: Right;  . Cholecystectomy  1990's?  . Eye surgery Right   . Insertion of dialysis catheter Right 10/16/2015    Procedure:  INSERTION OF PALINDROME DIALYSIS CATHETER ;  Surgeon: Elam Dutch, MD;  Location: [place];  Service: Vascular;  Laterality: Right;  . Av fistula placement Right 12/05/2015    Procedure: INSERTION OF ARTERIOVENOUS (AV) GORE-TEX GRAFT ARM;  Surgeon: Elam Dutch, MD;  Location: [place];  Service: Vascular;  Laterality: Right;  . Peripheral vascular catheterization N/A 03/20/2016    Procedure: A/V Shuntogram/Fistulagram;  Surgeon: Conrad Houston, MD;  Location: [place];  Service: Cardiovascular;  Laterality: N/A;    There were no vitals filed for this visit.      Subjective Assessment - 03/24/16 1433    Subjective Nothing new to report.   Currently in Pain? No/denies   Pain Onset Efrain Sella Adult PT Treatment/Exercise - 03/24/16 0001    Knee/Hip Exercises: Aerobic   Nustep Scifit level 3.0 all 4 extremities x 10 minutes with rpm>60             Balance Exercises - 03/24/16 1119    Balance Exercises: Standing   Rockerboard Anterior/posterior;Head turns;EO;10 reps;Intermittent UE support   Heel Raises Limitations Heel walk  2 to no UE support; Min guard   Toe Raise Limitations toe walk 1 to no UE support   Sit to Stand Time from physio ball with no UE support; feet side  attempted feet staggered but pt declined due to difficulty   Other Standing Exercises Ankle strategies, solid suface attempting no support     NMR for core strengthening and balance: Tall kneel- mini squats without UE support. Transitions: tall kneel <>1/2 kneel attempting to decrease UE support. Supervision with Left UE support.      PT Education - 03/24/16 1435    Education provided Yes   Education Details ankle/hip strategies with various standing balance activities.   Person(s) Educated Patient   Methods Explanation;Demonstration;Verbal cues   Comprehension Verbalized understanding;Returned demonstration;Verbal cues required;Need further  instruction          PT Short Term Goals - 02/22/16 1243    PT SHORT TERM GOAL #1   Title Pt will perform HEP independenlty for improved strength, balance and gait.  TARGET 03/23/16   Time 4   Period Weeks   Status New   PT SHORT TERM GOAL #2   Title Pt will improve Berg Score to at least 49/56 for decreased fall risk.   Time 4   Period Weeks   Status New   PT SHORT TERM GOAL #3   Title Pt will improve TUG score to less than or equal to 13.5 seconds for decreased fall risk.   Time 4   Period Weeks   Status New   PT SHORT TERM GOAL #4   Title Pt will amb. 300' over even/uneven terrain with LRAD at MOD I level.   Status New           PT Long Term Goals - 02/22/16 1245    PT LONG TERM GOAL #1   Title Pt will verbalize understanding of fall prevention in the home environment.  TARGET 04/22/16   Time 8   Period Weeks   Status New   PT LONG TERM GOAL #2   Title Pt will improve Dynamic Gait Index score to at least 19/24 for decreased fall risk.   Time 8   Period Weeks   Status New   PT LONG TERM GOAL #3   Title Pt will improve single limb stance to at least 3 seconds each leg for improved negotiation of steps and obstacles.   Time 8   Period Weeks   Status New   PT LONG TERM GOAL #4   Title Pt will amb. 600' over even/uneven terrain IND without AD to improve functional moblity.    Time 8   Period Weeks   Status New   PT LONG TERM GOAL #5   Title Pt will ascend/descent 12 steps with one handrail at MOD I level to negotiate steps at home.   Time 8   Period Weeks   Status New               Plan - 03/24/16 1439    Clinical Impression Statement Pt is making progress with ankle /hip strategies but continues to have difficulty with posture and R LE timing during gait.   Rehab Potential Good   Clinical Impairments Affecting Rehab Potential co-morbidities   PT Frequency 2x / week   PT Duration 8 weeks   PT Treatment/Interventions ADLs/Self Care Home  Management;Therapeutic exercise;Therapeutic activities;Functional mobility training;Gait training;Balance training;Neuromuscular re-education;Patient/family education   PT Next Visit Plan Try treadmill gait training to work on  improved RLE foot clearance and heelstrike; check STGS.   Consulted and Agree with Plan of Care Patient      Patient will benefit from skilled therapeutic intervention in order to improve the following deficits and impairments:  Abnormal gait, Decreased balance, Decreased mobility, Decreased strength, Difficulty walking, Postural dysfunction  Visit Diagnosis: Other abnormalities of gait and mobility  Unspecified lack of coordination  Unsteadiness on feet     Problem List Patient Active Problem List   Diagnosis Date Noted  . Hemiparesis affecting right side as late effect of cerebrovascular accident ([place]) 02/28/2016  . Aphasia, late effect of cerebrovascular disease 02/28/2016  . Acute encephalopathy 01/08/2016  . Seizures ([place]) 01/08/2016  . History of ischemic left MCA stroke 01/08/2016  . Right sided weakness 01/08/2016  . Chronic combined systolic and diastolic CHF (congestive heart failure) ([place]) 01/08/2016  . Atrial fibrillation ([place]) 01/08/2016  . Leukocytosis 01/08/2016  . Benign essential HTN 01/08/2016  . Anemia of chronic disease 01/08/2016  . Controlled type 2 diabetes mellitus with diabetic nephropathy, without long-term current use of insulin ([place]) 01/08/2016  . History of DVT (deep vein thrombosis) 01/08/2016  . ESRD (end stage renal disease) on dialysis [name])     Bjorn Loser, PTA  03/24/2016, 2:44 PM [facility] [address] [place], [place], [address] Phone: [phone]   Fax:  [phone]  Name: Isaiah [name] [name] MRN: [id number] Date of Birth: 03-06-1966

## 2016-03-24 NOTE — Therapy (Signed)
Home [address] [place], [place], [address] Phone: [phone]   Fax:  [phone]  Speech Language Pathology Treatment  Patient Details  Name: Isaiah [name] [facility] MRN: [id number] Date of Birth: 02-23-1966 Referring Provider: Alysia Penna, MD  Encounter Date: 03/24/2016      End of Session - 03/24/16 1105    Visit Number 7   Number of Visits 20   Date for SLP Re-Evaluation 05/02/16   SLP Start Time 24   SLP Stop Time  1103   SLP Time Calculation (min) 44 min   Activity Tolerance Patient tolerated treatment well      Past Medical History  Diagnosis Date  . Diabetes mellitus without complication ([place])   . Sickle cell-thalassemia disease ([place])     a. Sickle cell trait.  . Pulmonary embolism ([place]) 07/2011    a. Tx with Coumadin for 6 months (unknown cause per patient).  . Hyperkalemia 07/2011  . Acute renal failure ([place]) 07/2011  . Monoclonal gammopathy   . Sleep apnea     a. uses CPAP.  [name] Hypertension   . Gout   . Pulmonary embolism ([place]) 07/2011; 09/27/2014    a. Bilat PE 07/2011 - unclear cause, tx with 6 months Coumadin.;   . Monoclonal gammopathies   . Anemia   . Asthma   . OSA on CPAP   . History of recent blood transfusion 10/27/14    2 Units PRBC's  . ESRD (end stage renal disease) on dialysis ([place])   . Stroke ([place])   . A-fib [facility])     Past Surgical History  Procedure Laterality Date  . Cholecystectomy    . Bone marrow biopsy    . Other surgical history      Retinal surgery  . Pars plana vitrectomy  02/17/2012    Procedure: PARS PLANA VITRECTOMY WITH 25 GAUGE;  Surgeon: Hayden Pedro, MD;  Location: [place];  Service: Ophthalmology;  Laterality: Right;  . Cholecystectomy  1990's?  . Eye surgery Right   . Insertion of dialysis catheter Right 10/16/2015    Procedure: INSERTION OF PALINDROME DIALYSIS CATHETER ;  Surgeon: Elam Dutch, MD;  Location: [place];  Service: Vascular;   Laterality: Right;  . Av fistula placement Right 12/05/2015    Procedure: INSERTION OF ARTERIOVENOUS (AV) GORE-TEX GRAFT ARM;  Surgeon: Elam Dutch, MD;  Location: [facility];  Service: Vascular;  Laterality: Right;  . Peripheral vascular catheterization N/A 03/20/2016    Procedure: A/V Shuntogram/Fistulagram;  Surgeon: Conrad [place], MD;  Location: [place] [facility];  Service: Cardiovascular;  Laterality: N/A;    There were no vitals filed for this visit.      Subjective Assessment - 03/24/16 1032    Subjective Pt has been practicing both multisyllable and specific vowel worksheets this week.               ADULT SLP TREATMENT - 03/24/16 1034    General Information   Behavior/Cognition Alert;Cooperative;Pleasant mood   Treatment Provided   Treatment provided Cognitive-Linquistic   Pain Assessment   Pain Assessment No/denies pain   Cognitive-Linquistic Treatment   Treatment focused on Apraxia;Cognition   Skilled Treatment (speech tx <30 minutes): SLP had pt choose 20 random words from multisyllable word list. SLP facilitated drilled practice with vowel sounds in these words. (cognitive 25 minutes): SLP gave pt MoCA with patient having significant difficulty with working memory, error awareness, organization, and sequencing.    Assessment /  Recommendations / Plan   Plan Goals updated  cognitive goals added again, given MoCA    Progression Toward Goals   Progression toward goals --  goals updated to include cognition          SLP Education - 03/24/16 1231    Education provided Yes   Education Details adding cognitive linguistic goals to pt's STGs and LTGs   Person(s) Educated Patient   Methods Explanation   Comprehension Verbalized understanding          SLP Short Term Goals - 03/24/16 1101    SLP SHORT TERM GOAL #1   Title pt will demo HEP for apraxia/motor plannning with independence over 3 sessions   Baseline began new POC 02-21-16   Time 3   Period Weeks  or  8 visits, for all STGs   Status On-going   SLP SHORT TERM GOAL #2   Title pt will demo HEP for dysarthria with independence over 3 sessions   Time 3   Period Weeks   Status On-going   SLP SHORT TERM GOAL #3   Title pt will demo speech 95% accurate in sentence-length responses with modified independence (compensations) over 3 sessions   Time 3   Period Weeks   Status On-going   SLP SHORT TERM GOAL #4   Title pt will provide 4 memory strategies to SLP   Time 3   Period Weeks   Status New   SLP SHORT TERM GOAL #5   Title pt will demo simple math, time, money tasks with 80% success   Time 3   Period Weeks   Status New          SLP Long Term Goals - 03/24/16 1102    SLP LONG TERM GOAL #1   Title pt will demo functional speech in 20 minutes mod complex/complex conversation with modified independnence   Baseline new POC on 02-21-16   Time 5   Period Weeks  or 13 more visits, for all LTGs   Status On-going   SLP LONG TERM GOAL #2   Title pt will demo HEP for speech-motor planning indpendently over 5 total sessions    Time 5   Period Weeks   Status On-going   SLP LONG TERM GOAL #3   Title pt will demo HEP for dysarthria independently over 5 total sessions   Time 5   Period Weeks   Status On-going   SLP LONG TERM GOAL #4   Title pt will demo functional organizational/sequencing ability for cognitive communication tasks   Time 5   Period Weeks   Status New   SLP LONG TERM GOAL #5   Title pt will demo use of one memory strategy in 3 sessions   Time 5   Period Weeks   Status New          Plan - 03/24/16 1232    Clinical Impression Statement Continue speech therapy to maximize intellgiblity and cognitive skills for possible return to work. SLP re-added and generated cognitive linguistic goals based upon pt's Montreal Cognitive Assessment. This will need to be completed next session.    Speech Therapy Frequency 2x / week   Duration --  3 weeks or 5 more sessions (pt  has only had once a week since restart of tx on 02-21-16)   Treatment/Interventions SLP instruction and feedback;Compensatory strategies;Internal/external aids;Oral motor exercises;Patient/family education;Functional tasks;Cueing hierarchy   Potential to Achieve Goals Good   Potential Considerations Severity of impairments   Consulted  and Agree with Plan of Care Patient      Patient will benefit from skilled therapeutic intervention in order to improve the following deficits and impairments:   Apraxia  Cognitive communication deficit    Problem List Patient Active Problem List   Diagnosis Date Noted  . Hemiparesis affecting right side as late effect of cerebrovascular accident ([place]) 02/28/2016  . Aphasia, late effect of cerebrovascular disease 02/28/2016  . Acute encephalopathy 01/08/2016  . Seizures ([place]) 01/08/2016  . History of ischemic left MCA stroke 01/08/2016  . Right sided weakness 01/08/2016  . Chronic combined systolic and diastolic CHF (congestive heart failure) ([place]) 01/08/2016  . Atrial fibrillation ([place]) 01/08/2016  . Leukocytosis 01/08/2016  . Benign essential HTN 01/08/2016  . Anemia of chronic disease 01/08/2016  . Controlled type 2 diabetes mellitus with diabetic nephropathy, without long-term current use of insulin ([place]) 01/08/2016  . History of DVT (deep vein thrombosis) 01/08/2016  . ESRD (end stage renal disease) on dialysis [facility])     [place] ,[place],    03/24/2016, 12:39 PM  [facility] [address] [place], [place], [address] Phone: [phone]   Fax:  [phone]   Name: Isaiah [name] [facility] MRN: [id number] Date of Birth: 12-Jan-1966

## 2016-03-24 NOTE — Patient Instructions (Signed)
  Please complete the assigned speech therapy homework and return it to your next session.

## 2016-03-25 ENCOUNTER — Ambulatory Visit (INDEPENDENT_AMBULATORY_CARE_PROVIDER_SITE_OTHER): Payer: BLUE CROSS/BLUE SHIELD | Admitting: Neurology

## 2016-03-25 ENCOUNTER — Encounter: Payer: Self-pay | Admitting: Neurology

## 2016-03-25 ENCOUNTER — Ambulatory Visit (INDEPENDENT_AMBULATORY_CARE_PROVIDER_SITE_OTHER): Payer: 59 | Admitting: Pharmacist

## 2016-03-25 ENCOUNTER — Ambulatory Visit: Payer: Self-pay | Admitting: Physical Therapy

## 2016-03-25 ENCOUNTER — Encounter: Payer: Self-pay | Admitting: Speech Pathology

## 2016-03-25 VITALS — BP 107/73 | HR 61 | Ht 70.0 in | Wt 210.8 lb

## 2016-03-25 DIAGNOSIS — I639 Cerebral infarction, unspecified: Secondary | ICD-10-CM

## 2016-03-25 DIAGNOSIS — I2782 Chronic pulmonary embolism: Secondary | ICD-10-CM

## 2016-03-25 DIAGNOSIS — I4891 Unspecified atrial fibrillation: Secondary | ICD-10-CM

## 2016-03-25 DIAGNOSIS — R569 Unspecified convulsions: Secondary | ICD-10-CM | POA: Diagnosis not present

## 2016-03-25 LAB — POCT INR: INR: 1.7

## 2016-03-25 MED FILL — LANTUS SOLOSTAR 100 UNITS/M: 100 | 60 days supply | Qty: 15 | Fill #1

## 2016-03-25 NOTE — Patient Instructions (Addendum)
I had a long d/w patient about his recent stroke and seizure, risk for recurrent stroke/TIAs,seizures, personally independently reviewed imaging studies and stroke evaluation results and answered questions.Continue warfarin daily  for secondary stroke prevention given recent DVTand maintain strict control of hypertension with blood pressure goal below 130/90, diabetes with hemoglobin A1c goal below 6.5% and lipids with LDL cholesterol goal below 70 mg/dL. I also advised the patient to eat a healthy diet with plenty of whole grains, cereals, fruits and vegetables, exercise regularly and maintain ideal body weight .continue Vimpat 100 mg twice daily for seizure prophylaxis. Continue outpatient physical and occupational therapy. We also discussed fall and safety precautions. Followup in the future with stroke nurse practitioner in 6 months or call earlier if necessary  Seizure, Adult A seizure is abnormal electrical activity in the brain. Seizures usually last from 30 seconds to 2 minutes. There are various types of seizures. Before a seizure, you may have a warning sensation (aura) that a seizure is about to occur. An aura may include the following symptoms:   Fear or anxiety.  Nausea.  Feeling like the room is spinning (vertigo).  Vision changes, such as seeing flashing lights or spots. Common symptoms during a seizure include:  A change in attention or behavior (altered mental status).  Convulsions with rhythmic jerking movements.  Drooling.  Rapid eye movements.  Grunting.  Loss of bladder and bowel control.  Bitter taste in the mouth.  Tongue biting. After a seizure, you may feel confused and sleepy. You may also have an injury resulting from convulsions during the seizure. HOME CARE INSTRUCTIONS   If you are given medicines, take them exactly as prescribed by your health care provider.  Keep all follow-up appointments as directed by your health care provider.  Do not swim or  drive or engage in risky activity during which a seizure could cause further injury to you or others until your health care provider says it is OK.  Get adequate rest.  Teach friends and family what to do if you have a seizure. They should:  Lay you on the ground to prevent a fall.  Put a cushion under your head.  Loosen any tight clothing around your neck.  Turn you on your side. If vomiting occurs, this helps keep your airway clear.  Stay with you until you recover.  Know whether or not you need emergency care. SEEK IMMEDIATE MEDICAL CARE IF:  The seizure lasts longer than 5 minutes.  The seizure is severe or you do not wake up immediately after the seizure.  You have an altered mental status after the seizure.  You are having more frequent or worsening seizures. Someone should drive you to the emergency department or call local emergency services (911 in [place] MAKE SURE YOU:  Understand these instructions.  Will watch your condition.  Will get help right away if you are not doing well or get worse.   This information is not intended to replace advice given to you by your health care provider. Make sure you discuss any questions you have with your health care provider.   Document Released: 08/22/2000 Document Revised: 09/15/2014 Document Reviewed: 04/06/2013 Elsevier Interactive Patient Education [employer].

## 2016-03-25 NOTE — Progress Notes (Signed)
[facility] [address] [place]. [place] [address] [phone]       OFFICE FOLLOW-UP NOTE  Mr. Future Yeldell [facility] Date of Birth:  [birth year] Medical Record Number:  [id number]   HPI: Mr. Hofman is a 50 year old African-American gentleman seen today for first office follow-up visit following hospital consultation with Dr. Erlinda Hong in Oct 23, 2015. He is accompanied by his wife. He has a complicated PMH of DM type 2 with diabetic neuropathy, Sickle cell-thalassemia,  DVT and PE x 2 in 07/2011 and 1/ 2016 off coumadin due to GIB, OSA, CKD stage V, MGUS and anemia .He was recently admitted to [facility] in [place] on 09/16/15 with N/V and blood stools with acute on chronic renal failure due to acute pancreatitis. He was treated with supportive care, IV bicarb as well as multiple units PRBC and EGD with severe stage IV erosive esophagitis with gastritis.HD initiated on 01/09 due to poor recover with decrease in UOP. He developed hypoxic respiratory failure and was found to have RUL/RML PE as well as left popliteal DVT. He was treated with IV heparin. IVC filter was placed around 09/21/15 and post procedure was found to have right hand weakness. CT head on 09/26/15 reported possible subacut right MCA/PCA and left cerebellar infarcts which were not correlate to his right hand weakness. Neurology was consulted for input and EMG showed evidence of profound neuropathy. He had worsening of symptoms with increase in right sided weakness, facial droop speech difficulty on 09/27/15. MRI on 09/29/15 showedscattered right MCA and left MCA, left thalamus and b/l cerebellar infarcts. The infarcts on CT 09/26/15 likely to be acute too. On 09/30/15, he had worsening of aphasia and repeat MRI showed extension of left MCA frontal infarcts. MRA head showed left M2 superior branch occlusion. Echo with positive bubble study and TEE positive for PFO. Hypercoagulation studies  reported to be negative. IV heparin was changed to Lovenox due to recurrent embolic strokes on heparin.  He was then transferred here to [facility] rehab on 10/12/15. He was reported to have small amount of blood in stool on admission. Continued HD for CKD after admission. Due to CKD on HD, his lovenox discontinued and put back on heparin IV. His hematologist Dr. Alen Blew was consulted for anticoagulation use. He commented "As far as long-term anticoagulation, I feel be reasonable to consider aspirin alone and hope that IVC filter will prevent any future embolic strokes from occurring. An alternative strategy is to consider warfarin and follow closely for signs or symptoms of bleeding." since admission, Patient made significant improvement on his aphasia and right side weakness, and has been able to walk with assistance and speak much more fluent. His H&H  remained stable and no obvious bleeding reported. Dr. Erlinda Hong recommended starting warfarin. Patient has tolerated warfarin well without any more episodes of bleeding and hematocrit has remained stable. He is currently participating in outpatient physical, occupational and speech therapy and making good improvement. His able to states speaks short sentences now with some word hesitancy. He still has significant weakness in his right hand and grip but is able to walk with a cane. His balance is good is had no major falls. He does complain of pain and stiffness in his legs and hand particularly at night at times he has to wake up because of discomfort. He is being followed in the rehabilitation clinic but currently is not taking any medications for spasticity. His sugars have all been under good control. Update  03/25/2016 : He returns for follow-up after last visit with me 3 months ago. He is accompanied today by his son. He was hospitalized on 4/312017 with a seizure. He was started on Vimpat 100 twice daily. EEG showed generalized slowing without definite epileptiform activity.  Limited MRI scan the brain was obtained on 12/3115 which showed multiple late subacute to chronic infarcts and her repeat MRI on 5/117 showed resolution and no definite new acute infarct. Patient was initially felt to be candidate for rehabilitation but after prolonged postictal phase for several days he made rapid improvement and was discharged home. Doing well. His able to walk without assistance. His 2 has mild expressive aphasia but speech is improving. Still has mild right-sided weakness. He walks with dragging his right leg. Is tolerating Vimpat well without side effects. He remains on warfarin and is tolerating it well without bleeding or bruising. He states his fasting sugars 7 well controlled. He has no new complaints. ROS:   14 system review of systems is positive for weakness, gait difficulty, speech difficulty, seizure and anemia  PMH:  Past Medical History  Diagnosis Date  . Diabetes mellitus without complication ([place])   . Sickle cell-thalassemia disease ([place])     a. Sickle cell trait.  . Pulmonary embolism ([place]) 07/2011    a. Tx with Coumadin for 6 months (unknown cause per patient).  . Hyperkalemia 07/2011  . Acute renal failure ([place]) 07/2011  . Monoclonal gammopathy   . Sleep apnea     a. uses CPAP.  [name] Hypertension   . Gout   . Pulmonary embolism ([place]) 07/2011; 09/27/2014    a. Bilat PE 07/2011 - unclear cause, tx with 6 months Coumadin.;   . Monoclonal gammopathies   . Anemia   . Asthma   . OSA on CPAP   . History of recent blood transfusion 10/27/14    2 Units PRBC's  . ESRD (end stage renal disease) on dialysis ([place])   . Stroke ([place])   . A-fib ([place])   . Seizures ([place])     Social History:  Social History   Social History  . Marital Status: Married    Spouse Name: sylvia  . Number of Children: 1  . Years of Education: college   Occupational History  . [place] History Main Topics  . Smoking status: Never Smoker   . Smokeless  tobacco: Never Used  . Alcohol Use: No  . Drug Use: No  . Sexual Activity: Yes   Other Topics Concern  . Not on file   Social History Narrative   ** Merged History Encounter **        Medications:   Current Outpatient Prescriptions on File Prior to Visit  Medication Sig Dispense Refill  . aspirin EC 81 MG tablet Take 1 tablet (81 mg total) by mouth daily. 100 tablet 0  . enoxaparin (LOVENOX) 100 MG/ML injection Inject 1 mL (100 mg total) into the skin daily. 10 Syringe 1  . epoetin alfa (EPOGEN,PROCRIT) 34742 UNIT/ML injection Inject 5,000 Units into the skin once a week. Reported on 01/17/2016    . fluticasone furoate-vilanterol (BREO ELLIPTA) 100-25 MCG/INH AEPB Inhale 1 puff into the lungs daily. 60 each 1  . Insulin Glargine (LANTUS) 100 UNIT/ML Solostar Pen Inject 10 Units into the skin daily at 10 pm. (Patient taking differently: Inject 25 Units into the skin daily at 10 pm. ) 15 mL 1  . Lacosamide (VIMPAT) 100 MG TABS  Take 1 tablet (100 mg total) by mouth 2 (two) times daily. 28 tablet 0  . metoprolol tartrate (LOPRESSOR) 25 MG tablet Take 0.5 tablets (12.5 mg total) by mouth 2 (two) times daily. 60 tablet 0  . midodrine (PROAMATINE) 2.5 MG tablet Take 1 tablet (2.5 mg total) by mouth 2 (two) times daily with a meal. 60 tablet 1  . multivitamin (RENA-VIT) TABS tablet Take 1 tablet by mouth at bedtime. 30 tablet 1  . NEEDLE, DISP, 27 G (BD DISP NEEDLES) 27G X 1/2" MISC 1 application by Does not apply route daily after supper. 100 each 0  . pantoprazole (PROTONIX) 40 MG tablet Take 1 tablet (40 mg total) by mouth 2 (two) times daily. 60 tablet 1  . warfarin (COUMADIN) 5 MG tablet Take 1.5 tablets (7.5 mg total) by mouth daily at 6 PM. 45 tablet 1   No current facility-administered medications on file prior to visit.    Allergies:   Allergies  Allergen Reactions  . Codeine Rash and Other (See Comments)    Unknown reaction (patient says it was more serious than just a rash, but  he can't remember what happened) Unknown reaction (patient says it was more serious than just a rash, but he can't remember what happened)    Physical Exam General: well developed, well nourished, seated, in no evident distress Head: head normocephalic and atraumatic.  Neck: supple with no carotid or supraclavicular bruits Cardiovascular: regular rate and rhythm, no murmurs Musculoskeletal: no deformity Skin:  no rash/petichiae Vascular:  Normal pulses all extremities Filed Vitals:   03/25/16 1121  BP: 107/73  Pulse: 61   Neurologic Exam Mental Status: Awake and fully alert. Oriented to place and time. Recent and remote memory intact. Attention span, concentration and fund of knowledge Is slightly impaired. Mood and affect appropriate. Mild expressive aphasia with word finding difficulties and dysfluency. Good comprehension, naming and repetition. Cranial Nerves: Fundoscopic exam not done.  . Pupils equal, briskly reactive to light. Extraocular movements full without nystagmus. Visual fields full to confrontation. Hearing intact. Moderate right lower facial weakness. Facial sensation intact. Face, tongue, palate moves normally and symmetrically.  Motor: Spastic right hemiparesis with 4/5 right shoulder and elbow strength with significant weakness of the right grip and intrinsic hand muscles. 4/5 right lower extremity strength with spasticity and increased tone. Ankle dorsiflexors week. Sensory.:   touch ,pinprick .position and vibratory sensation slightly diminished on the right compared to the left.  Coordination: Impaired on the right side and normal on the left  Gait and Station: Arises from chair without difficulty. Spastic hemiplegic gait with dragging of the right foot and circumduction.  Reflexes: 1+ and symmetric. Toes downgoing.   NIHSS  6 Modified Rankin  3   ASSESSMENT: 50 year old African-American male with bilateral anterior and posterior circulation embolic infarcts in  January 2017 secondary to paradoxical embolism from deep vein thrombosis, pulmonary embolism and patent foramen ovale while he was off Coumadin due to GI bleeding with prolonged hospitalization in [facility] but was transferred here for rehabilitation. Repeat admission in April 2017 for seizure. He has residual expressive aphasia and   spastic right hemiparesis . Vascular risk factors of diabetes, hypertension, sleep apnea   PLAN: I had a long d/w patient and his son about his recent stroke and seizure, risk for recurrent stroke/TIAs,seizures, personally independently reviewed imaging studies and stroke evaluation results and answered questions.Continue warfarin daily  for secondary stroke prevention given recent DVTand maintain strict control of hypertension with  blood pressure goal below 130/90, diabetes with hemoglobin A1c goal below 6.5% and lipids with LDL cholesterol goal below 70 mg/dL. I also advised the patient to eat a healthy diet with plenty of whole grains, cereals, fruits and vegetables, exercise regularly and maintain ideal body weight .continue Vimpat 100 mg twice daily for seizure prophylaxis. Continue outpatient physical and occupational therapy. We also discussed fall and safety precautions. Followup in the future with stroke nurse practitioner in 6 months or call earlier if necessary Antony Contras, MD  [facility] [address] [place], [place] [address]  Phone [phone] Fax [phone]    Note: This document was prepared with digital dictation and possible smart phrase technology. Any transcriptional errors that result from this process are unintentional

## 2016-03-26 DIAGNOSIS — D688 Other specified coagulation defects: Secondary | ICD-10-CM | POA: Diagnosis not present

## 2016-03-26 DIAGNOSIS — D509 Iron deficiency anemia, unspecified: Secondary | ICD-10-CM | POA: Diagnosis not present

## 2016-03-26 DIAGNOSIS — E1129 Type 2 diabetes mellitus with other diabetic kidney complication: Secondary | ICD-10-CM | POA: Diagnosis not present

## 2016-03-26 DIAGNOSIS — N186 End stage renal disease: Secondary | ICD-10-CM | POA: Diagnosis not present

## 2016-03-26 DIAGNOSIS — T8249XA Other complication of vascular dialysis catheter, initial encounter: Secondary | ICD-10-CM | POA: Diagnosis not present

## 2016-03-26 MED FILL — BREO ELLIPTA 200-25 MCG INH: 200-25 | 30 days supply | Qty: 60 | Fill #2

## 2016-03-27 ENCOUNTER — Ambulatory Visit: Payer: 59 | Admitting: Physical Therapy

## 2016-03-27 ENCOUNTER — Ambulatory Visit: Payer: 59 | Admitting: Speech Pathology

## 2016-03-27 DIAGNOSIS — R2681 Unsteadiness on feet: Secondary | ICD-10-CM

## 2016-03-27 DIAGNOSIS — R482 Apraxia: Secondary | ICD-10-CM | POA: Diagnosis not present

## 2016-03-27 DIAGNOSIS — R41841 Cognitive communication deficit: Secondary | ICD-10-CM

## 2016-03-27 DIAGNOSIS — I69351 Hemiplegia and hemiparesis following cerebral infarction affecting right dominant side: Secondary | ICD-10-CM | POA: Diagnosis not present

## 2016-03-27 DIAGNOSIS — R278 Other lack of coordination: Secondary | ICD-10-CM | POA: Diagnosis not present

## 2016-03-27 DIAGNOSIS — R471 Dysarthria and anarthria: Secondary | ICD-10-CM | POA: Diagnosis not present

## 2016-03-27 DIAGNOSIS — R279 Unspecified lack of coordination: Secondary | ICD-10-CM

## 2016-03-27 DIAGNOSIS — M6281 Muscle weakness (generalized): Secondary | ICD-10-CM

## 2016-03-27 DIAGNOSIS — R2689 Other abnormalities of gait and mobility: Secondary | ICD-10-CM

## 2016-03-27 DIAGNOSIS — I69315 Cognitive social or emotional deficit following cerebral infarction: Secondary | ICD-10-CM | POA: Diagnosis not present

## 2016-03-27 MED FILL — SENSIPAR 60 MG TABLET: 60 | 30 days supply | Qty: 30 | Fill #1

## 2016-03-27 NOTE — Therapy (Signed)
[facility] [address] [place], [place], [address] Phone: [phone]   Fax:  [phone]  Physical Therapy Treatment  Patient Details  Name: Isaiah [name] [facility] MRN: [id number] Date of Birth: 03-Jan-1966 Referring Provider: Dr. Letta Pate  Encounter Date: 03/27/2016      PT End of Session - 03/27/16 1206    Visit Number 9   Number of Visits 17   Date for PT Re-Evaluation 04/22/16   Authorization Type BCBS   PT Start Time 1106   PT Stop Time 1149   PT Time Calculation (min) 43 min   Equipment Utilized During Treatment Gait belt   Activity Tolerance Patient tolerated treatment well   Behavior During Therapy [name] for tasks assessed/performed      Past Medical History  Diagnosis Date  . Diabetes mellitus without complication ([place])   . Sickle cell-thalassemia disease ([place])     a. Sickle cell trait.  . Pulmonary embolism ([place]) 07/2011    a. Tx with Coumadin for 6 months (unknown cause per patient).  . Hyperkalemia 07/2011  . Acute renal failure ([place]) 07/2011  . Monoclonal gammopathy   . Sleep apnea     a. uses CPAP.  [name] Hypertension   . Gout   . Pulmonary embolism ([place]) 07/2011; 09/27/2014    a. Bilat PE 07/2011 - unclear cause, tx with 6 months Coumadin.;   . Monoclonal gammopathies   . Anemia   . Asthma   . OSA on CPAP   . History of recent blood transfusion 10/27/14    2 Units PRBC's  . ESRD (end stage renal disease) on dialysis ([place])   . Stroke ([place])   . A-fib ([place])   . Seizures [name])     Past Surgical History  Procedure Laterality Date  . Cholecystectomy    . Bone marrow biopsy    . Other surgical history      Retinal surgery  . Pars plana vitrectomy  02/17/2012    Procedure: PARS PLANA VITRECTOMY WITH 25 GAUGE;  Surgeon: Hayden Pedro, MD;  Location: [place];  Service: Ophthalmology;  Laterality: Right;  . Cholecystectomy  1990's?  . Eye surgery Right   . Insertion of dialysis catheter Right  10/16/2015    Procedure: INSERTION OF PALINDROME DIALYSIS CATHETER ;  Surgeon: Elam Dutch, MD;  Location: [place];  Service: Vascular;  Laterality: Right;  . Av fistula placement Right 12/05/2015    Procedure: INSERTION OF ARTERIOVENOUS (AV) GORE-TEX GRAFT ARM;  Surgeon: Elam Dutch, MD;  Location: [place];  Service: Vascular;  Laterality: Right;  . Peripheral vascular catheterization N/A 03/20/2016    Procedure: A/V Shuntogram/Fistulagram;  Surgeon: Conrad Butts, MD;  Location: [place] [facility];  Service: Cardiovascular;  Laterality: N/A;    There were no vitals filed for this visit.      Subjective Assessment - 03/27/16 1203    Subjective Denies changes or falls   Pertinent History Seizure 12/2015; DVT, PE-has IVC filter in placed, DM, CKD-stage V on dialysis, sickle cell anemia, OSA, asthma, HTN, tachycardia in hospital (12/06/15), AVF    Patient Stated Goals Pt's goal for therapy is to get back to full range of motion and return to work.   Currently in Pain? No/denies          [facility] Adult PT Treatment/Exercise - 03/27/16 0001    Transfers   Transfers Sit to Stand;Stand to Sit   Sit to Stand 7: Independent;Without upper extremity assist  Stand to Sit 7: Independent;Without upper extremity assist   Number of Reps 10 reps   Ambulation/Gait   Ambulation/Gait Yes   Ambulation/Gait Assistance 7: Independent   Ambulation Distance (Feet) 400 Feet   Assistive device None   Gait Pattern Step-through pattern;Decreased dorsiflexion - right;Poor foot clearance - right   Ambulation Surface Level;Unlevel;Indoor;Outdoor;Paved;Other (comment);Grass  concrete   Standardized Balance Assessment   Standardized Balance Assessment Berg Balance Test   Berg Balance Test   Sit to Stand Able to stand without using hands and stabilize independently   Standing Unsupported Able to stand safely 2 minutes   Sitting with Back Unsupported but Feet Supported on Floor or Stool Able to sit safely and  securely 2 minutes   Stand to Sit Sits safely with minimal use of hands   Transfers Able to transfer safely, minor use of hands   Standing Unsupported with Eyes Closed Able to stand 10 seconds safely   Standing Ubsupported with Feet Together Able to place feet together independently and stand 1 minute safely   From Standing, Reach Forward with Outstretched Arm Can reach confidently >25 cm (10")   From Standing Position, Pick up Object from Floor Able to pick up shoe safely and easily   From Standing Position, Turn to Look Behind Over each Shoulder Looks behind from both sides and weight shifts well   Turn 360 Degrees Able to turn 360 degrees safely in 4 seconds or less   Standing Unsupported, Alternately Place Feet on Step/Stool Able to complete >2 steps/needs minimal assist   Standing Unsupported, One Foot in Front Able to plae foot ahead of the other independently and hold 30 seconds   Standing on One Leg Tries to lift leg/unable to hold 3 seconds but remains standing independently   Total Score 49   Timed Up and Go Test   TUG Normal TUG   Normal TUG (seconds) 8.84  no device   High Level Balance   High Level Balance Activities Other (comment)      Reviewed HEP program including Seated dorsiflexion with blue theraband bil LE x 15;Standing in corner eyes closed, eyes closed with head turns/nods on compliant and noncompliant surface;Step ups onto/off 6" step with bil LE x 15 with UE support.            PT Education - 03/27/16 1204    Education provided Yes   Education Details Progress toward goals   Person(s) Educated Patient   Methods Explanation   Comprehension Verbalized understanding          PT Short Term Goals - 02/22/16 1243    PT SHORT TERM GOAL #1   Title Pt will perform HEP independenlty for improved strength, balance and gait.  TARGET 03/23/16   Time 4   Period Weeks   Status New   PT SHORT TERM GOAL #2   Title Pt will improve Berg Score to at least 49/56  for decreased fall risk.   Time 4   Period Weeks   Status New   PT SHORT TERM GOAL #3   Title Pt will improve TUG score to less than or equal to 13.5 seconds for decreased fall risk.   Time 4   Period Weeks   Status New   PT SHORT TERM GOAL #4   Title Pt will amb. 300' over even/uneven terrain with LRAD at MOD I level.   Status New           PT Long Term Goals -  02/22/16 1245    PT LONG TERM GOAL #1   Title Pt will verbalize understanding of fall prevention in the home environment.  TARGET 04/22/16   Time 8   Period Weeks   Status New   PT LONG TERM GOAL #2   Title Pt will improve Dynamic Gait Index score to at least 19/24 for decreased fall risk.   Time 8   Period Weeks   Status New   PT LONG TERM GOAL #3   Title Pt will improve single limb stance to at least 3 seconds each leg for improved negotiation of steps and obstacles.   Time 8   Period Weeks   Status New   PT LONG TERM GOAL #4   Title Pt will amb. 600' over even/uneven terrain IND without AD to improve functional moblity.    Time 8   Period Weeks   Status New   PT LONG TERM GOAL #5   Title Pt will ascend/descent 12 steps with one handrail at MOD I level to negotiate steps at home.   Time 8   Period Weeks   Status New               Plan - 03/27/16 1207    Clinical Impression Statement Pt met all STG's except does need help with band placement.  Will ask wife to come in for a session to review HEP with caregiver as well.  Continue PT per POC.   Rehab Potential Good   Clinical Impairments Affecting Rehab Potential co-morbidities   PT Frequency 2x / week   PT Duration 8 weeks   PT Treatment/Interventions ADLs/Self Care Home Management;Therapeutic exercise;Therapeutic activities;Functional mobility training;Gait training;Balance training;Neuromuscular re-education;Patient/family education   PT Next Visit Plan Try treadmill gait training to work on improved RLE foot clearance and heelstrike.  Add  standing exercises with resistance band to HEP.  See if wife would be able to come in for a session to review HEP.   Consulted and Agree with Plan of Care Patient      Patient will benefit from skilled therapeutic intervention in order to improve the following deficits and impairments:  Abnormal gait, Decreased balance, Decreased mobility, Decreased strength, Difficulty walking, Postural dysfunction  Visit Diagnosis: Other abnormalities of gait and mobility  Unspecified lack of coordination  Unsteadiness on feet  Muscle weakness (generalized)     Problem List Patient Active Problem List   Diagnosis Date Noted  . Hemiparesis affecting right side as late effect of cerebrovascular accident ([place]) 02/28/2016  . Aphasia, late effect of cerebrovascular disease 02/28/2016  . Acute encephalopathy 01/08/2016  . Seizures ([place]) 01/08/2016  . History of ischemic left MCA stroke 01/08/2016  . Right sided weakness 01/08/2016  . Chronic combined systolic and diastolic CHF (congestive heart failure) ([place]) 01/08/2016  . Atrial fibrillation ([place]) 01/08/2016  . Leukocytosis 01/08/2016  . Benign essential HTN 01/08/2016  . Anemia of chronic disease 01/08/2016  . Controlled type 2 diabetes mellitus with diabetic nephropathy, without long-term current use of insulin ([place]) 01/08/2016  . History of DVT (deep vein thrombosis) 01/08/2016  . ESRD (end stage renal disease) on dialysis [facility])     Narda Bonds 03/27/2016, 1:18 PM  [facility] [address] [place], [place], [address] Phone: [phone]   Fax:  [phone]  Name: Reshad Saab [facility] MRN: [id number] Date of Birth: 1965/12/21    Narda Bonds, Mallory 03/27/2016 1:18 PM Phone: [phone] Fax:  [phone]

## 2016-03-27 NOTE — Therapy (Signed)
[facility] [address] [place], [place], [address] Phone: [phone]   Fax:  [phone]  Speech Language Pathology Treatment  Patient Details  Name: Isaiah [name] [name] MRN: [id number] Date of Birth: 07-21-1966 Referring Provider: Alysia Penna, MD  Encounter Date: 03/27/2016      End of Session - 03/27/16 1156    Visit Number 8   Number of Visits 20   Date for SLP Re-Evaluation 05/02/16   Authorization Type BCBS auth requested   SLP Start Time 1017   SLP Stop Time  1101   SLP Time Calculation (min) 44 min      Past Medical History  Diagnosis Date  . Diabetes mellitus without complication ([place])   . Sickle cell-thalassemia disease ([place])     a. Sickle cell trait.  . Pulmonary embolism ([place]) 07/2011    a. Tx with Coumadin for 6 months (unknown cause per patient).  . Hyperkalemia 07/2011  . Acute renal failure ([place]) 07/2011  . Monoclonal gammopathy   . Sleep apnea     a. uses CPAP.  [name] Hypertension   . Gout   . Pulmonary embolism ([place]) 07/2011; 09/27/2014    a. Bilat PE 07/2011 - unclear cause, tx with 6 months Coumadin.;   . Monoclonal gammopathies   . Anemia   . Asthma   . OSA on CPAP   . History of recent blood transfusion 10/27/14    2 Units PRBC's  . ESRD (end stage renal disease) on dialysis ([place])   . Stroke ([place])   . A-fib ([place])   . Seizures [name])     Past Surgical History  Procedure Laterality Date  . Cholecystectomy    . Bone marrow biopsy    . Other surgical history      Retinal surgery  . Pars plana vitrectomy  02/17/2012    Procedure: PARS PLANA VITRECTOMY WITH 25 GAUGE;  Surgeon: Hayden Pedro, MD;  Location: [place];  Service: Ophthalmology;  Laterality: Right;  . Cholecystectomy  1990's?  . Eye surgery Right   . Insertion of dialysis catheter Right 10/16/2015    Procedure: INSERTION OF PALINDROME DIALYSIS CATHETER ;  Surgeon: Elam Dutch, MD;  Location: [place];  Service: Vascular;   Laterality: Right;  . Av fistula placement Right 12/05/2015    Procedure: INSERTION OF ARTERIOVENOUS (AV) GORE-TEX GRAFT ARM;  Surgeon: Elam Dutch, MD;  Location: [facility];  Service: Vascular;  Laterality: Right;  . Peripheral vascular catheterization N/A 03/20/2016    Procedure: A/V Shuntogram/Fistulagram;  Surgeon: Conrad Reynolds, MD;  Location: [place] [facility];  Service: Cardiovascular;  Laterality: N/A;    There were no vitals filed for this visit.      Subjective Assessment - 03/27/16 1022    Subjective My speech has been better   Currently in Pain? No/denies               ADULT SLP TREATMENT - 03/27/16 1022    General Information   Behavior/Cognition Alert;Cooperative;Pleasant mood   Treatment Provided   Treatment provided Cognitive-Linquistic   Pain Assessment   Pain Assessment No/denies pain   Cognitive-Linquistic Treatment   Treatment focused on Apraxia;Cognition   Skilled Treatment Pt's speech fairly improved, with vowel distortions 20% of utterances in simple conversation and structured tasks - pt able to correct vowel distortions with rare to occasional min cues to prolong vowels and min modeling/placement cues. Cognition: mildly complex  math and reasoning with usual mod  A . SLP had to reduce task to simple basic math with occasional min A. Basic multiplication and simple word problems provided for homework.   Assessment / Recommendations / Plan   Plan Continue with current plan of care   Progression Toward Goals   Progression toward goals Progressing toward goals            SLP Short Term Goals - 03/27/16 1155    SLP SHORT TERM GOAL #1   Title pt will demo HEP for apraxia/motor plannning with independence over 3 sessions   Baseline began new POC 02-21-16   Time 2   Period Weeks  or 8 visits, for all STGs   Status On-going   SLP SHORT TERM GOAL #2   Title pt will demo HEP for dysarthria with independence over 3 sessions   Baseline 03/27/16,     Time 3   Period Weeks   Status On-going   SLP SHORT TERM GOAL #3   Title pt will demo speech 95% accurate in sentence-length responses with modified independence (compensations) over 3 sessions   Time 3   Period Weeks   Status On-going   SLP SHORT TERM GOAL #4   Title pt will provide 4 memory strategies to SLP   Time 3   Period Weeks   Status New   SLP SHORT TERM GOAL #5   Title pt will demo simple math, time, money tasks with 80% success   Time 3   Period Weeks   Status On-going          SLP Long Term Goals - 03/27/16 1156    SLP LONG TERM GOAL #1   Title pt will demo functional speech in 20 minutes mod complex/complex conversation with modified independnence   Baseline new POC on 02-21-16   Time 5   Period Weeks  or 13 more visits, for all LTGs   Status On-going   SLP LONG TERM GOAL #2   Title pt will demo HEP for speech-motor planning indpendently over 5 total sessions    Time 5   Period Weeks   Status On-going   SLP LONG TERM GOAL #3   Title pt will demo HEP for dysarthria independently over 5 total sessions   Time 5   Period Weeks   Status On-going   SLP LONG TERM GOAL #4   Title pt will demo functional organizational/sequencing ability for cognitive communication tasks   Time 5   Period Weeks   Status New   SLP LONG TERM GOAL #5   Title pt will demo use of one memory strategy in 3 sessions   Time 5   Period Weeks   Status New          Plan - 03/27/16 1153    Clinical Impression Statement Pt's speech has improved with 80% accuracy of vowels with rare min A to correct vowel distortions at simple conversation level. Mod A for simple and mildly complex math tasks - pt stated that he was very quick with math at his job . Continue skilled ST to maximize intelligibility and cognition for  possible return to work.    Speech Therapy Frequency 2x / week   Treatment/Interventions SLP instruction and feedback;Compensatory strategies;Internal/external aids;Oral  motor exercises;Patient/family education;Functional tasks;Cueing hierarchy   Potential to Achieve Goals Good   Potential Considerations Severity of impairments   Consulted and Agree with Plan of Care Patient      Patient will benefit from skilled therapeutic intervention in order  to improve the following deficits and impairments:   Cognitive communication deficit  Dysarthria and anarthria    Problem List Patient Active Problem List   Diagnosis Date Noted  . Hemiparesis affecting right side as late effect of cerebrovascular accident ([place]) 02/28/2016  . Aphasia, late effect of cerebrovascular disease 02/28/2016  . Acute encephalopathy 01/08/2016  . Seizures ([place]) 01/08/2016  . History of ischemic left MCA stroke 01/08/2016  . Right sided weakness 01/08/2016  . Chronic combined systolic and diastolic CHF (congestive heart failure) ([place]) 01/08/2016  . Atrial fibrillation ([place]) 01/08/2016  . Leukocytosis 01/08/2016  . Benign essential HTN 01/08/2016  . Anemia of chronic disease 01/08/2016  . Controlled type 2 diabetes mellitus with diabetic nephropathy, without long-term current use of insulin ([place]) 01/08/2016  . History of DVT (deep vein thrombosis) 01/08/2016  . ESRD (end stage renal disease) on dialysis [name])     Trinidi Toppins, Annye Rusk MS, CCC-SLP 03/27/2016, 11:57 AM  [facility] [address] [place], [place], [address] Phone: [phone]   Fax:  [phone]   Name: Aodhan [facility] MRN: [id number] Date of Birth: 1965/11/29

## 2016-03-28 DIAGNOSIS — N186 End stage renal disease: Secondary | ICD-10-CM | POA: Diagnosis not present

## 2016-03-28 DIAGNOSIS — E1129 Type 2 diabetes mellitus with other diabetic kidney complication: Secondary | ICD-10-CM | POA: Diagnosis not present

## 2016-03-28 DIAGNOSIS — D509 Iron deficiency anemia, unspecified: Secondary | ICD-10-CM | POA: Diagnosis not present

## 2016-03-28 DIAGNOSIS — T8249XA Other complication of vascular dialysis catheter, initial encounter: Secondary | ICD-10-CM | POA: Diagnosis not present

## 2016-03-28 DIAGNOSIS — D688 Other specified coagulation defects: Secondary | ICD-10-CM | POA: Diagnosis not present

## 2016-03-31 ENCOUNTER — Encounter: Payer: Self-pay | Admitting: Physical Therapy

## 2016-03-31 ENCOUNTER — Ambulatory Visit: Payer: 59

## 2016-03-31 ENCOUNTER — Ambulatory Visit: Payer: 59 | Admitting: Physical Therapy

## 2016-03-31 DIAGNOSIS — D631 Anemia in chronic kidney disease: Secondary | ICD-10-CM | POA: Diagnosis not present

## 2016-03-31 DIAGNOSIS — R41841 Cognitive communication deficit: Secondary | ICD-10-CM | POA: Diagnosis not present

## 2016-03-31 DIAGNOSIS — D688 Other specified coagulation defects: Secondary | ICD-10-CM | POA: Diagnosis not present

## 2016-03-31 DIAGNOSIS — M6281 Muscle weakness (generalized): Secondary | ICD-10-CM

## 2016-03-31 DIAGNOSIS — R2689 Other abnormalities of gait and mobility: Secondary | ICD-10-CM

## 2016-03-31 DIAGNOSIS — R278 Other lack of coordination: Secondary | ICD-10-CM | POA: Diagnosis not present

## 2016-03-31 DIAGNOSIS — R471 Dysarthria and anarthria: Secondary | ICD-10-CM | POA: Diagnosis not present

## 2016-03-31 DIAGNOSIS — I69315 Cognitive social or emotional deficit following cerebral infarction: Secondary | ICD-10-CM | POA: Diagnosis not present

## 2016-03-31 DIAGNOSIS — E1129 Type 2 diabetes mellitus with other diabetic kidney complication: Secondary | ICD-10-CM | POA: Diagnosis not present

## 2016-03-31 DIAGNOSIS — R482 Apraxia: Secondary | ICD-10-CM

## 2016-03-31 DIAGNOSIS — T8249XA Other complication of vascular dialysis catheter, initial encounter: Secondary | ICD-10-CM | POA: Diagnosis not present

## 2016-03-31 DIAGNOSIS — D509 Iron deficiency anemia, unspecified: Secondary | ICD-10-CM | POA: Diagnosis not present

## 2016-03-31 DIAGNOSIS — R2681 Unsteadiness on feet: Secondary | ICD-10-CM

## 2016-03-31 DIAGNOSIS — I69351 Hemiplegia and hemiparesis following cerebral infarction affecting right dominant side: Secondary | ICD-10-CM | POA: Diagnosis not present

## 2016-03-31 DIAGNOSIS — Z7901 Long term (current) use of anticoagulants: Secondary | ICD-10-CM | POA: Diagnosis not present

## 2016-03-31 DIAGNOSIS — N186 End stage renal disease: Secondary | ICD-10-CM | POA: Diagnosis not present

## 2016-03-31 MED FILL — CALCIUM ACETATE 667 MG CAP: 667 | 30 days supply | Qty: 180 | Fill #0

## 2016-03-31 NOTE — Therapy (Signed)
[facility] [address] [place], [place], [address] Phone: [phone]   Fax:  [phone]  Physical Therapy Treatment  Patient Details  Name: Isaiah [name] MRN: [id number] Date of Birth: [birth year] Referring Provider: Dr. Letta [name]  Encounter Date: 03/31/2016      PT End of Session - 03/31/16 1104    Visit Number 10   Number of Visits 17   Date for PT Re-Evaluation 04/22/16   Authorization Type BCBS   PT Start Time 1020   PT Stop Time 1058   PT Time Calculation (min) 38 min   Equipment Utilized During Treatment Gait belt   Activity Tolerance Patient tolerated treatment well   Behavior During Therapy [name] for tasks assessed/performed;Agitated  pt gets agitated easily with difficult tasks or with recommendations on exercise routine he doesn't think necessary      Past Medical History:  Diagnosis Date  . A-fib ([place])   . Acute renal failure ([place]) 07/2011  . Anemia   . Asthma   . Diabetes mellitus without complication ([place])   . ESRD (end stage renal disease) on dialysis ([place])   . Gout   . History of recent blood transfusion 10/27/14   2 Units PRBC's  . Hyperkalemia 07/2011  . Hypertension   . Monoclonal gammopathies   . Monoclonal gammopathy   . OSA on CPAP   . Pulmonary embolism ([place]) 07/2011   a. Tx with Coumadin for 6 months (unknown cause per patient).  . Pulmonary embolism ([place]) 07/2011; 09/27/2014   a. Bilat PE 07/2011 - unclear cause, tx with 6 months Coumadin.;   . Seizures ([place])   . Sickle cell-thalassemia disease ([place])    a. Sickle cell trait.  . Sleep apnea    a. uses CPAP.  [name] Stroke [name])     Past Surgical History:  Procedure Laterality Date  . AV FISTULA PLACEMENT Right 12/05/2015   Procedure: INSERTION OF ARTERIOVENOUS (AV) GORE-TEX GRAFT ARM;  Surgeon: Isaiah Dutch, MD;  Location: [facility];  Service: Vascular;  Laterality: Right;  . BONE MARROW BIOPSY    . CHOLECYSTECTOMY    .  CHOLECYSTECTOMY  1990's?  . EYE SURGERY Right   . INSERTION OF DIALYSIS CATHETER Right 10/16/2015   Procedure: INSERTION OF PALINDROME DIALYSIS CATHETER ;  Surgeon: Isaiah Dutch, MD;  Location: [facility];  Service: Vascular;  Laterality: Right;  . OTHER SURGICAL HISTORY     Retinal surgery  . PARS PLANA VITRECTOMY  02/17/2012   Procedure: PARS PLANA VITRECTOMY WITH 25 GAUGE;  Surgeon: Isaiah Pedro, MD;  Location: [facility];  Service: Ophthalmology;  Laterality: Right;  . PERIPHERAL VASCULAR CATHETERIZATION N/A 03/20/2016   Procedure: A/V Shuntogram/Fistulagram;  Surgeon: Isaiah Waxahachie, MD;  Location: [facility];  Service: Cardiovascular;  Laterality: N/A;    There were no vitals filed for this visit.      Subjective Assessment - 03/31/16 1023    Subjective "I didn't do anything" pt reports since last visit.  Pt doesn't feel like he will have a chance to work on walking or balance till next week due to MD appointment tommorrow and going out of town over the weekend.     Pertinent History Seizure 12/2015; DVT, PE-has IVC filter in placed, DM, CKD-stage V on dialysis, sickle cell anemia, OSA, asthma, HTN, tachycardia in [name] (12/06/15), AVF    Patient Stated Goals Pt's goal for therapy is to get back to full range of motion and  return to work.   Currently in Pain? No/denies                              Balance Exercises - 03/31/16 1036      Balance Exercises: Standing   Standing Eyes Opened Narrow base of support (BOS);Other (comment);20 secs  reaching to the ground with narrow BOS and throwing with upper trunk rotation.   SLS Eyes open;5 reps  amb. forward tapping cones with alt foot, progressing with head turns.   Tandem Gait Forward;4 reps  min guard   Retro Gait 4 reps  supervision   Sidestepping 4 reps  x2 with front then back crossovers, min guard   Other Standing Exercises standing on compliant surface: attempting mini squats with intermittent UE  support           PT Education - 03/31/16 1101    Education provided Yes   Education Details Importance of performing daily exercise routine with walking program and balance HEP   Person(s) Educated Patient   Methods Explanation   Comprehension Other (comment)  pt reports walking with wife but indicated that he did not see the necessity of daily walking program          PT Short Term Goals - 02/22/16 1243      PT SHORT TERM GOAL #1   Title Pt will perform HEP independenlty for improved strength, balance and gait.  TARGET 03/23/16   Time 4   Period Weeks   Status New     PT SHORT TERM GOAL #2   Title Pt will improve Berg Score to at least 49/56 for decreased fall risk.   Time 4   Period Weeks   Status New     PT SHORT TERM GOAL #3   Title Pt will improve TUG score to less than or equal to 13.5 seconds for decreased fall risk.   Time 4   Period Weeks   Status New     PT SHORT TERM GOAL #4   Title Pt will amb. 300' over even/uneven terrain with LRAD at MOD I level.   Status New           PT Long Term Goals - 02/22/16 1245      PT LONG TERM GOAL #1   Title Pt will verbalize understanding of fall prevention in the home environment.  TARGET 04/22/16   Time 8   Period Weeks   Status New     PT LONG TERM GOAL #2   Title Pt will improve Dynamic Gait Index score to at least 19/24 for decreased fall risk.   Time 8   Period Weeks   Status New     PT LONG TERM GOAL #3   Title Pt will improve single limb stance to at least 3 seconds each leg for improved negotiation of steps and obstacles.   Time 8   Period Weeks   Status New     PT LONG TERM GOAL #4   Title Pt will amb. 600' over even/uneven terrain IND without AD to improve functional moblity.    Time 8   Period Weeks   Status New     PT LONG TERM GOAL #5   Title Pt will ascend/descent 12 steps with one handrail at MOD I level to negotiate steps at home.   Time 8   Period Weeks   Status New  Plan - 03/31/16 1550    Clinical Impression Statement Wife did not come this session for HEP review and Pt reports not doing HEP since last visit and doesn't plan to until next week.  Explained the benefits of working on HEP and walking daily; pt appeared agitated by suggestion.  Pt continues to be challanged with higher level balance activities with narrow BOS (requires supervision level) and on compliant surface (reuqires intermittant UE support).                                                           Rehab Potential Good   Clinical Impairments Affecting Rehab Potential co-morbidities   PT Frequency 2x / week   PT Duration 8 weeks   PT Treatment/Interventions ADLs/Self Care Home Management;Therapeutic exercise;Therapeutic activities;Functional mobility training;Gait training;Balance training;Neuromuscular re-education;Patient/family education   PT Next Visit Plan Try treadmill gait training to work on improved RLE foot clearance and heelstrike.  Add standing exercises with resistance band to HEP.  See if wife would be able to come in for a session to review HEP.   Consulted and Agree with Plan of Care Patient      Patient will benefit from skilled therapeutic intervention in order to improve the following deficits and impairments:  Abnormal gait, Decreased balance, Decreased mobility, Decreased strength, Difficulty walking, Postural dysfunction  Visit Diagnosis: Other abnormalities of gait and mobility  Unsteadiness on feet  Muscle weakness (generalized)     Problem List Patient Active Problem List   Diagnosis Date Noted  . Hemiparesis affecting right side as late effect of cerebrovascular accident ([place]) 02/28/2016  . Aphasia, late effect of cerebrovascular disease 02/28/2016  . Acute encephalopathy 01/08/2016  . Seizures ([place]) 01/08/2016  . History of ischemic left MCA stroke 01/08/2016  . Right sided weakness 01/08/2016  . Chronic combined systolic and  diastolic CHF (congestive heart failure) ([place]) 01/08/2016  . Atrial fibrillation ([place]) 01/08/2016  . Leukocytosis 01/08/2016  . Benign essential HTN 01/08/2016  . Anemia of chronic disease 01/08/2016  . Controlled type 2 diabetes mellitus with diabetic nephropathy, without long-term current use of insulin ([place]) 01/08/2016  . History of DVT (deep vein thrombosis) 01/08/2016  . ESRD (end stage renal disease) on dialysis [facility])     Bjorn Loser, PTA  03/31/16, 3:57 PM [facility] [address] [place], [place], [address] Phone: [phone]   Fax:  [phone]  Name: Isaiah [name] MRN: [id number] Date of Birth: October 03, 1965

## 2016-03-31 NOTE — Therapy (Signed)
[facility] [address] [place], [place], [address] Phone: [phone]   Fax:  [phone]  Speech Language Pathology Treatment  Patient Details  Name: Isaiah [name] [facility] MRN: [id number] Date of Birth: 1965-11-22 Referring Provider: Alysia Penna, MD  Encounter Date: 03/31/2016      End of Session - 03/31/16 1202    Visit Number 9   Number of Visits 20   Date for SLP Re-Evaluation 05/02/16   SLP Start Time 1104   SLP Stop Time  1147   SLP Time Calculation (min) 43 min   Activity Tolerance Patient tolerated treatment well      Past Medical History:  Diagnosis Date  . A-fib ([place])   . Acute renal failure ([place]) 07/2011  . Anemia   . Asthma   . Diabetes mellitus without complication ([place])   . ESRD (end stage renal disease) on dialysis ([place])   . Gout   . History of recent blood transfusion 10/27/14   2 Units PRBC's  . Hyperkalemia 07/2011  . Hypertension   . Monoclonal gammopathies   . Monoclonal gammopathy   . OSA on CPAP   . Pulmonary embolism ([place]) 07/2011   a. Tx with Coumadin for 6 months (unknown cause per patient).  . Pulmonary embolism ([place]) 07/2011; 09/27/2014   a. Bilat PE 07/2011 - unclear cause, tx with 6 months Coumadin.;   . Seizures ([place])   . Sickle cell-thalassemia disease ([place])    a. Sickle cell trait.  . Sleep apnea    a. uses CPAP.  [name] Stroke [facility])     Past Surgical History:  Procedure Laterality Date  . AV FISTULA PLACEMENT Right 12/05/2015   Procedure: INSERTION OF ARTERIOVENOUS (AV) GORE-TEX GRAFT ARM;  Surgeon: Elam Dutch, MD;  Location: [facility];  Service: Vascular;  Laterality: Right;  . BONE MARROW BIOPSY    . CHOLECYSTECTOMY    . CHOLECYSTECTOMY  1990's?  . EYE SURGERY Right   . INSERTION OF DIALYSIS CATHETER Right 10/16/2015   Procedure: INSERTION OF PALINDROME DIALYSIS CATHETER ;  Surgeon: Elam Dutch, MD;  Location: [facility];  Service: Vascular;  Laterality: Right;  .  OTHER SURGICAL HISTORY     Retinal surgery  . PARS PLANA VITRECTOMY  02/17/2012   Procedure: PARS PLANA VITRECTOMY WITH 25 GAUGE;  Surgeon: Hayden Pedro, MD;  Location: [facility];  Service: Ophthalmology;  Laterality: Right;  . PERIPHERAL VASCULAR CATHETERIZATION N/A 03/20/2016   Procedure: A/V Shuntogram/Fistulagram;  Surgeon: Conrad [place], MD;  Location: [facility];  Service: Cardiovascular;  Laterality: N/A;    There were no vitals filed for this visit.      Subjective Assessment - 03/31/16 1142    Subjective Pt's conversational speech is notably better than last time this SLP saw pt.               ADULT SLP TREATMENT - 03/31/16 1143      General Information   Behavior/Cognition Alert;Cooperative;Pleasant mood     Treatment Provided   Treatment provided Cognitive-Linquistic     Pain Assessment   Pain Assessment No/denies pain     Cognitive-Linquistic Treatment   Treatment focused on Apraxia;Cognition   Skilled Treatment (speech tx <30 minutes) Pt's speech cont'd with vowel distortions approx 20% of utterances in simple conversation. Pt was able to correct vowel distortions with occasional verbal and modeling cues for placement of vowels in reading sentences. Cognition (13 minutes): mildly complex math problems re:  distance with usual mod A. SLP req'd to provide max A to pt to solve. Pt showed good awareness saying, "That shouldn't have taken me that long."     Assessment / Recommendations / Plan   Plan Continue with current plan of care     Progression Toward Goals   Progression toward goals Progressing toward goals            SLP Short Term Goals - 03/31/16 1204      SLP SHORT TERM GOAL #1   Title pt will demo HEP for apraxia/motor plannning with independence over 3 sessions   Baseline began new POC 02-21-16   Time 3   Period --  more visits, for all STGs   Status On-going     SLP SHORT TERM GOAL #2   Title pt will demo HEP for dysarthria with  independence over 3 sessions   Baseline 03/27/16,    Time 3   Period --  visits   Status On-going     SLP SHORT TERM GOAL #3   Title pt will demo speech 95% accurate in sentence-length responses with modified independence (compensations) over 3 sessions   Time 3   Period --  visits   Status On-going     SLP SHORT TERM GOAL #4   Title pt will provide 4 memory strategies to SLP   Time 3   Period --  visits   Status On-going     SLP SHORT TERM GOAL #5   Title pt will demo simple math, time, money tasks with 80% success   Time 3   Period --  visits   Status On-going          SLP Long Term Goals - 03/31/16 1208      SLP LONG TERM GOAL #1   Title pt will demo functional speech in 20 minutes mod complex/complex conversation with modified independnence   Baseline new POC on 02-21-16   Time 11   Period Weeks   more visits, for all LTGs   Status On-going     SLP LONG TERM GOAL #2   Title pt will demo HEP for speech-motor planning indpendently over 5 total sessions    Time 11   Period --  visits   Status On-going     SLP LONG TERM GOAL #3   Title pt will demo HEP for dysarthria independently over 5 total sessions   Time 11   Period --  visits   Status On-going     SLP LONG TERM GOAL #4   Title pt will demo functional organizational/sequencing ability for cognitive communication tasks   Time 11   Period --  visits   Status On-going     SLP LONG TERM GOAL #5   Title pt will demo use of one memory strategy in 3 sessions   Time 11   Period --  visits   Status On-going          Plan - 03/31/16 1203    Clinical Impression Statement Pt's speech has improved with 80% accuracy of vowels with rare min A to correct vowel distortions at simple conversation level. In strucutred tasks, more difficulty noted. Max A for simple 5th-6th grade math tasks . Continue skilled ST to maximize intelligibility and cognition for possible return to work.    Speech Therapy Frequency  2x / week   Duration --  8 weeks   Treatment/Interventions SLP instruction and feedback;Compensatory strategies;Internal/external aids;Oral motor exercises;Patient/family education;Functional tasks;Cueing  hierarchy;Cognitive reorganization   Potential to Achieve Goals Good   Potential Considerations Severity of impairments   Consulted and Agree with Plan of Care Patient      Patient will benefit from skilled therapeutic intervention in order to improve the following deficits and impairments:   Apraxia  Cognitive communication deficit  Dysarthria and anarthria    Problem List Patient Active Problem List   Diagnosis Date Noted  . Hemiparesis affecting right side as late effect of cerebrovascular accident ([place]) 02/28/2016  . Aphasia, late effect of cerebrovascular disease 02/28/2016  . Acute encephalopathy 01/08/2016  . Seizures ([place]) 01/08/2016  . History of ischemic left MCA stroke 01/08/2016  . Right sided weakness 01/08/2016  . Chronic combined systolic and diastolic CHF (congestive heart failure) ([place]) 01/08/2016  . Atrial fibrillation ([place]) 01/08/2016  . Leukocytosis 01/08/2016  . Benign essential HTN 01/08/2016  . Anemia of chronic disease 01/08/2016  . Controlled type 2 diabetes mellitus with diabetic nephropathy, without long-term current use of insulin ([place]) 01/08/2016  . History of DVT (deep vein thrombosis) 01/08/2016  . ESRD (end stage renal disease) on dialysis [facility])     [place] ,[place], [place]  03/31/2016, 12:10 PM  [facility] [address] [place], [place], [address] Phone: [phone]   Fax:  [phone]   Name: Isaiah [name] MRN: [id number] Date of Birth: [birth year]

## 2016-04-01 ENCOUNTER — Encounter: Payer: Self-pay | Admitting: Speech Pathology

## 2016-04-01 ENCOUNTER — Ambulatory Visit: Payer: Self-pay | Admitting: Physical Therapy

## 2016-04-01 ENCOUNTER — Encounter: Payer: Self-pay | Admitting: Occupational Therapy

## 2016-04-01 DIAGNOSIS — E1165 Type 2 diabetes mellitus with hyperglycemia: Secondary | ICD-10-CM | POA: Diagnosis not present

## 2016-04-01 DIAGNOSIS — N189 Chronic kidney disease, unspecified: Secondary | ICD-10-CM | POA: Diagnosis not present

## 2016-04-01 DIAGNOSIS — Z7901 Long term (current) use of anticoagulants: Secondary | ICD-10-CM | POA: Diagnosis not present

## 2016-04-02 ENCOUNTER — Encounter (HOSPITAL_COMMUNITY): Payer: Self-pay

## 2016-04-02 ENCOUNTER — Telehealth: Payer: Self-pay | Admitting: Allergy and Immunology

## 2016-04-02 ENCOUNTER — Ambulatory Visit: Payer: Self-pay | Admitting: Vascular Surgery

## 2016-04-02 DIAGNOSIS — D631 Anemia in chronic kidney disease: Secondary | ICD-10-CM | POA: Diagnosis not present

## 2016-04-02 DIAGNOSIS — D688 Other specified coagulation defects: Secondary | ICD-10-CM | POA: Diagnosis not present

## 2016-04-02 DIAGNOSIS — D509 Iron deficiency anemia, unspecified: Secondary | ICD-10-CM | POA: Diagnosis not present

## 2016-04-02 DIAGNOSIS — Z7901 Long term (current) use of anticoagulants: Secondary | ICD-10-CM | POA: Diagnosis not present

## 2016-04-02 DIAGNOSIS — E1129 Type 2 diabetes mellitus with other diabetic kidney complication: Secondary | ICD-10-CM | POA: Diagnosis not present

## 2016-04-02 DIAGNOSIS — N186 End stage renal disease: Secondary | ICD-10-CM | POA: Diagnosis not present

## 2016-04-02 DIAGNOSIS — T8249XA Other complication of vascular dialysis catheter, initial encounter: Secondary | ICD-10-CM | POA: Diagnosis not present

## 2016-04-02 MED FILL — WARFARIN SODIUM 1 MG TABLET: 1 | 30 days supply | Qty: 30 | Fill #0

## 2016-04-02 MED FILL — WARFARIN SODIUM 7.5 MG TAB: 7.5 | 30 days supply | Qty: 30 | Fill #0

## 2016-04-02 NOTE — Telephone Encounter (Signed)
Dr Neldon Mc please advise

## 2016-04-02 NOTE — Telephone Encounter (Signed)
Please refer patient to Dr. Keturah Barre, sleep pulmonologist, regarding obtaining a sleep study as Dr. Annamaria Boots once the sleep lab.

## 2016-04-02 NOTE — Telephone Encounter (Signed)
Pt. Wife called and said Dr. Neldon Mc ordered a CPAP machine last year for him. He has had a stroke and now has Medicare and they are requiring a sleep study for him. Wife wants to know if we schedule those studies.

## 2016-04-03 ENCOUNTER — Encounter: Payer: Self-pay | Admitting: Occupational Therapy

## 2016-04-03 ENCOUNTER — Encounter: Payer: Self-pay | Admitting: Speech Pathology

## 2016-04-03 ENCOUNTER — Ambulatory Visit: Payer: 59 | Admitting: Occupational Therapy

## 2016-04-03 ENCOUNTER — Ambulatory Visit: Payer: 59 | Admitting: Speech Pathology

## 2016-04-03 ENCOUNTER — Encounter: Payer: Self-pay | Admitting: Physical Therapy

## 2016-04-03 ENCOUNTER — Ambulatory Visit: Payer: 59 | Admitting: Physical Therapy

## 2016-04-03 DIAGNOSIS — Z86718 Personal history of other venous thrombosis and embolism: Secondary | ICD-10-CM

## 2016-04-03 DIAGNOSIS — R2689 Other abnormalities of gait and mobility: Secondary | ICD-10-CM | POA: Diagnosis not present

## 2016-04-03 DIAGNOSIS — R482 Apraxia: Secondary | ICD-10-CM | POA: Diagnosis not present

## 2016-04-03 DIAGNOSIS — R41841 Cognitive communication deficit: Secondary | ICD-10-CM

## 2016-04-03 DIAGNOSIS — M6281 Muscle weakness (generalized): Secondary | ICD-10-CM

## 2016-04-03 DIAGNOSIS — R278 Other lack of coordination: Secondary | ICD-10-CM | POA: Diagnosis not present

## 2016-04-03 DIAGNOSIS — R531 Weakness: Secondary | ICD-10-CM

## 2016-04-03 DIAGNOSIS — I69351 Hemiplegia and hemiparesis following cerebral infarction affecting right dominant side: Secondary | ICD-10-CM | POA: Diagnosis not present

## 2016-04-03 DIAGNOSIS — R471 Dysarthria and anarthria: Secondary | ICD-10-CM | POA: Diagnosis not present

## 2016-04-03 DIAGNOSIS — R2681 Unsteadiness on feet: Secondary | ICD-10-CM | POA: Diagnosis not present

## 2016-04-03 DIAGNOSIS — I69315 Cognitive social or emotional deficit following cerebral infarction: Secondary | ICD-10-CM | POA: Diagnosis not present

## 2016-04-03 NOTE — Therapy (Signed)
[facility] [address] [place], [place], [address] Phone: [phone]   Fax:  [phone]  Physical Therapy Treatment  Patient Details  Name: Isaiah [name] [facility] MRN: [id number] Date of Birth: 08/18/1966 Referring Provider: Dr. Letta Pate  Encounter Date: 04/03/2016    Past Medical History:  Diagnosis Date  . A-fib ([place])   . Acute renal failure ([place]) 07/2011  . Anemia   . Asthma   . Diabetes mellitus without complication ([place])   . ESRD (end stage renal disease) on dialysis ([place])   . Gout   . History of recent blood transfusion 10/27/14   2 Units PRBC's  . Hyperkalemia 07/2011  . Hypertension   . Monoclonal gammopathies   . Monoclonal gammopathy   . OSA on CPAP   . Pulmonary embolism ([place]) 07/2011   a. Tx with Coumadin for 6 months (unknown cause per patient).  . Pulmonary embolism ([place]) 07/2011; 09/27/2014   a. Bilat PE 07/2011 - unclear cause, tx with 6 months Coumadin.;   . Seizures ([place])   . Sickle cell-thalassemia disease ([place])    a. Sickle cell trait.  . Sleep apnea    a. uses CPAP.  [name] Stroke [facility])     Past Surgical History:  Procedure Laterality Date  . AV FISTULA PLACEMENT Right 12/05/2015   Procedure: INSERTION OF ARTERIOVENOUS (AV) GORE-TEX GRAFT ARM;  Surgeon: Elam Dutch, MD;  Location: [facility];  Service: Vascular;  Laterality: Right;  . BONE MARROW BIOPSY    . CHOLECYSTECTOMY    . CHOLECYSTECTOMY  1990's?  . EYE SURGERY Right   . INSERTION OF DIALYSIS CATHETER Right 10/16/2015   Procedure: INSERTION OF PALINDROME DIALYSIS CATHETER ;  Surgeon: Elam Dutch, MD;  Location: [facility];  Service: Vascular;  Laterality: Right;  . OTHER SURGICAL HISTORY     Retinal surgery  . PARS PLANA VITRECTOMY  02/17/2012   Procedure: PARS PLANA VITRECTOMY WITH 25 GAUGE;  Surgeon: Hayden Pedro, MD;  Location: [facility];  Service: Ophthalmology;  Laterality: Right;  . PERIPHERAL VASCULAR CATHETERIZATION N/A 03/20/2016    Procedure: A/V Shuntogram/Fistulagram;  Surgeon: Conrad [place], MD;  Location: [facility];  Service: Cardiovascular;  Laterality: N/A;    There were no vitals filed for this visit.      Subjective Assessment - 04/03/16 1022    Subjective No issues with balance.   Currently in Pain? No/denies                         [facility] Adult PT Treatment/Exercise - 04/03/16 0001      Ambulation/Gait   Ambulation/Gait Yes   Ambulation/Gait Assistance 6: Modified independent (Device/Increase time);5: Supervision   Ambulation/Gait Assistance Details Gait Trainer on the treadmill 6 min similar patterns for steplength and time spent on each foot but scored lower than normal for overall steplength, and speed.  Good foot clearance with little to no footdrag   Ambulation Distance (Feet) 500 Feet  working on balance with changes in direction, obstacle negotiation.   Assistive device None   Gait Pattern Step-through pattern;Decreased dorsiflexion - right;Poor foot clearance - right   Ambulation Surface Unlevel;Indoor;Outdoor;Paved;Gravel;Grass;Level   Ramp 7: Independent   Curb 7: Independent       NEUROMUSCULAR RE-EDUCATION: Stepping strategies working on maintaining balance with stepping forward, backwards with weight shifts And holding position before transition. Required intermittent UE support  PT Short Term Goals - 04/03/16 1440      PT SHORT TERM GOAL #1   Title Pt will perform HEP independenlty for improved strength, balance and gait.  TARGET 03/23/16   Baseline Per progress note on 03/27/16   Time 4   Period Weeks   Status Partially Met     PT SHORT TERM GOAL #2   Title Pt will improve Berg Score to at least 49/56 for decreased fall risk.   Baseline per progress note. 49/56.  03/27/16   Time 4   Period Weeks   Status Achieved     PT SHORT TERM GOAL #3   Title Pt will improve TUG score to less than or equal to 13.5 seconds for decreased fall  risk.   Baseline Per progress note, 8.84 seconds no AD. 03/27/16   Time 4   Period Weeks   Status Achieved     PT SHORT TERM GOAL #4   Title Pt will amb. 300' over even/uneven terrain with LRAD at MOD I level.   Baseline per progress note 03/27/16   Status Achieved           PT Long Term Goals - 02/22/16 1245      PT LONG TERM GOAL #1   Title Pt will verbalize understanding of fall prevention in the home environment.  TARGET 04/22/16   Time 8   Period Weeks   Status New     PT LONG TERM GOAL #2   Title Pt will improve Dynamic Gait Index score to at least 19/24 for decreased fall risk.   Time 8   Period Weeks   Status New     PT LONG TERM GOAL #3   Title Pt will improve single limb stance to at least 3 seconds each leg for improved negotiation of steps and obstacles.   Time 8   Period Weeks   Status New     PT LONG TERM GOAL #4   Title Pt will amb. 600' over even/uneven terrain IND without AD to improve functional moblity.    Time 8   Period Weeks   Status New     PT LONG TERM GOAL #5   Title Pt will ascend/descent 12 steps with one handrail at MOD I level to negotiate steps at home.   Time 8   Period Weeks   Status New               Plan - 04/03/16 1444    Clinical Impression Statement Pt was challenged with Gait Trainer on treadmill and demonstrated outside on goal target for step length, speed, and step cycle but had good right foot clearance.   Rehab Potential Good   Clinical Impairments Affecting Rehab Potential co-morbidities   PT Frequency 2x / week   PT Duration 8 weeks   PT Treatment/Interventions ADLs/Self Care Home Management;Therapeutic exercise;Therapeutic activities;Functional mobility training;Gait training;Balance training;Neuromuscular re-education;Patient/family education   PT Next Visit Plan Try treadmill gait training to work on improved RLE foot clearance and heelstrike.  Add standing exercises with resistance band to HEP.  See if wife  would be able to come in for a session to review HEP.   Consulted and Agree with Plan of Care Patient      Patient will benefit from skilled therapeutic intervention in order to improve the following deficits and impairments:  Abnormal gait, Decreased balance, Decreased mobility, Decreased strength, Difficulty walking, Postural dysfunction  Visit Diagnosis: No diagnosis found.  Problem List Patient Active Problem List   Diagnosis Date Noted  . Hemiparesis affecting right side as late effect of cerebrovascular accident ([place]) 02/28/2016  . Aphasia, late effect of cerebrovascular disease 02/28/2016  . Acute encephalopathy 01/08/2016  . Seizures ([place]) 01/08/2016  . History of ischemic left MCA stroke 01/08/2016  . Right sided weakness 01/08/2016  . Chronic combined systolic and diastolic CHF (congestive heart failure) ([place]) 01/08/2016  . Atrial fibrillation ([place]) 01/08/2016  . Leukocytosis 01/08/2016  . Benign essential HTN 01/08/2016  . Anemia of chronic disease 01/08/2016  . Controlled type 2 diabetes mellitus with diabetic nephropathy, without long-term current use of insulin ([place]) 01/08/2016  . History of DVT (deep vein thrombosis) 01/08/2016  . ESRD (end stage renal disease) on dialysis [name])    Bjorn Loser, PTA  04/03/16, 4:01 PM [facility] [address] [place], [place], [address] Phone: [phone]   Fax:  [phone]  Name: Kage Willmann [facility] MRN: [id number] Date of Birth: Apr 21, 1966

## 2016-04-03 NOTE — Therapy (Signed)
[facility] [address] [place], [place], [address] Phone: [phone]   Fax:  [phone]  Occupational Therapy Treatment  Patient Details  Name: Isaiah [name] [facility] MRN: [id number] Date of Birth: 1965-11-19 Referring Provider: Alysia Penna  Encounter Date: 04/03/2016      OT End of Session - 04/03/16 1151    Visit Number 6   Number of Visits 17   Date for OT Re-Evaluation 05/01/16   Authorization Type medicare   Authorization Time Period "$20 co pay, no visit limit, no auth required"   Authorization - Visit Number 6   Authorization - Number of Visits 10   OT Start Time 1105   OT Stop Time 1145   OT Time Calculation (min) 40 min   Activity Tolerance Patient tolerated treatment well      Past Medical History:  Diagnosis Date  . A-fib ([place])   . Acute renal failure ([place]) 07/2011  . Anemia   . Asthma   . Diabetes mellitus without complication ([place])   . ESRD (end stage renal disease) on dialysis ([place])   . Gout   . History of recent blood transfusion 10/27/14   2 Units PRBC's  . Hyperkalemia 07/2011  . Hypertension   . Monoclonal gammopathies   . Monoclonal gammopathy   . OSA on CPAP   . Pulmonary embolism ([place]) 07/2011   a. Tx with Coumadin for 6 months (unknown cause per patient).  . Pulmonary embolism ([place]) 07/2011; 09/27/2014   a. Bilat PE 07/2011 - unclear cause, tx with 6 months Coumadin.;   . Seizures ([place])   . Sickle cell-thalassemia disease ([place])    a. Sickle cell trait.  . Sleep apnea    a. uses CPAP.  [name] Stroke [facility])     Past Surgical History:  Procedure Laterality Date  . AV FISTULA PLACEMENT Right 12/05/2015   Procedure: INSERTION OF ARTERIOVENOUS (AV) GORE-TEX GRAFT ARM;  Surgeon: Elam Dutch, MD;  Location: [place] [facility];  Service: Vascular;  Laterality: Right;  . BONE MARROW BIOPSY    . CHOLECYSTECTOMY    . CHOLECYSTECTOMY  1990's?  . EYE SURGERY Right   . INSERTION OF DIALYSIS CATHETER  Right 10/16/2015   Procedure: INSERTION OF PALINDROME DIALYSIS CATHETER ;  Surgeon: Elam Dutch, MD;  Location: [facility];  Service: Vascular;  Laterality: Right;  . OTHER SURGICAL HISTORY     Retinal surgery  . PARS PLANA VITRECTOMY  02/17/2012   Procedure: PARS PLANA VITRECTOMY WITH 25 GAUGE;  Surgeon: Hayden Pedro, MD;  Location: [facility];  Service: Ophthalmology;  Laterality: Right;  . PERIPHERAL VASCULAR CATHETERIZATION N/A 03/20/2016   Procedure: A/V Shuntogram/Fistulagram;  Surgeon: Conrad Panama, MD;  Location: [place] CV [facility];  Service: Cardiovascular;  Laterality: N/A;    There were no vitals filed for this visit.      Subjective Assessment - 04/03/16 1108    Subjective  I had a stroke in January 2017, seizure April 30th this year   Pertinent History see Epic    Patient Stated Goals to use right hand better   Currently in Pain? No/denies                      OT Treatments/Exercises (OP) - 04/03/16 0001      Hand Exercises   Other Hand Exercises Gripper set at 20 lbs resistance to pick up blocks for sustained grip strength, endurance, control and coordination RUE. Pt with min  difficulty and drops     Fine Motor Coordination   Fine Motor Coordination Handwriting   Handwriting Pre-writing exercises for control/coordination: tracing over shapes and letters with highlighter. Practiced writing name with various pens, but preferred built up pen with foam. Pt reports he has tan and red foam at home.  Pt with significant difficulty writing first name with only 25% legibility, very small writing, and going off lines provided (? Also d/t visual deficits as well as decr. Coordination)                  OT Short Term Goals - 03/18/16 1427      OT SHORT TERM GOAL #1   Title I with initiall HEP- 03/20/2016   Time 4   Period Weeks   Status Achieved     OT SHORT TERM GOAL #2   Title Pt will perform all basic ADLS with supervision.   Time 4   Period Weeks    Status Achieved     OT SHORT TERM GOAL #3   Title Patient will demonstrate improved functional strength in right UE as evidenced by brushing teeth with dominant right hand   Time 4   Period Weeks   Status Achieved     OT SHORT TERM GOAL #4   Title Pt will demonstrate ability to retrieve a light weight (less than 1 lb) object at 120 shoulder flexion with RUE.   Time 4   Period Weeks   Status Achieved  3 pound object     OT SHORT TERM GOAL #5   Title Patient will demostrate improved coordination for right hand as evidenced by 15 on box and blocks test   Time 4   Period Weeks   Status Achieved  25 on 03/12/2016     OT SHORT TERM GOAL #6   Title Pt will report feeding himself with RUE 75% of the time.   Time 4   Period Weeks   Status Achieved           OT Long Term Goals - 03/18/16 1427      OT LONG TERM GOAL #1   Title I with updated HEP.- 04/17/2016   Time 8   Period Weeks   Status On-going     OT LONG TERM GOAL #2   Title Pt will demonstrate ability to write his name legibily.   Time 8   Period Weeks   Status On-going     OT LONG TERM GOAL #3   Title Patient will demonstrate adequate strength and coordination in dominant right hand to tighten laces and tie shoes using bilateral technique   Time 8   Period Weeks   Status On-going     OT LONG TERM GOAL #4   Title Assess 9 hole peg test and determine goal   Status Achieved  pt unable to do 1 in 1 minute time      OT LONG TERM GOAL #5   Title Pt will demonstrate RUE grip strength of 20+ lbs for increased functional use.   Time 8   Period Weeks   Status On-going  12 pounds on 03/18/2016 using 3rd notch     OT LONG TERM GOAL #6   Title Pt will perform all basic ADLs and simple meal prep modified independently.   Time 8   Period Weeks   Status On-going               Plan - 04/03/16 1152  Clinical Impression Statement Pt with improved sustained grip strength Rt hand, but siginificant difficulty  with writing ex's. Pt appears to also have nerve damage in Rt hand.    Rehab Potential Good   OT Frequency 2x / week   OT Duration 8 weeks   OT Treatment/Interventions Self-care/ADL training;Therapeutic exercise;Patient/family education;Balance training;Splinting;Manual Therapy;Neuromuscular education;Ultrasound;Therapeutic exercises;Therapeutic activities;DME and/or AE instruction;Parrafin;Cryotherapy;Electrical Stimulation;Fluidtherapy;Scar mobilization;Cognitive remediation/compensation;Moist Heat;Contrast Bath;Passive range of motion;Visual/perceptual remediation/compensation;Functional Mobility Training   Plan NMR RUE, continue coordination   OT Home Exercise Plan Education issued:  12/31/15 initial HEP (coordination, cane ex);  added putty on 02/26/2016   Consulted and Agree with Plan of Care Patient      Patient will benefit from skilled therapeutic intervention in order to improve the following deficits and impairments:  Cardiopulmonary status limiting activity, Decreased activity tolerance, Decreased balance, Decreased cognition, Decreased endurance, Decreased coordination, Decreased strength, Increased edema, Impaired tone, Impaired UE functional use, Impaired vision/preception  Visit Diagnosis: Apraxia  Other lack of coordination  Hemiplegia and hemiparesis following cerebral infarction affecting right dominant side (HCC)  Muscle weakness (generalized)    Problem List Patient Active Problem List   Diagnosis Date Noted  . Hemiparesis affecting right side as late effect of cerebrovascular accident ([place]) 02/28/2016  . Aphasia, late effect of cerebrovascular disease 02/28/2016  . Acute encephalopathy 01/08/2016  . Seizures ([place]) 01/08/2016  . History of ischemic left MCA stroke 01/08/2016  . Right sided weakness 01/08/2016  . Chronic combined systolic and diastolic CHF (congestive heart failure) ([place]) 01/08/2016  . Atrial fibrillation ([place]) 01/08/2016  . Leukocytosis  01/08/2016  . Benign essential HTN 01/08/2016  . Anemia of chronic disease 01/08/2016  . Controlled type 2 diabetes mellitus with diabetic nephropathy, without long-term current use of insulin ([place]) 01/08/2016  . History of DVT (deep vein thrombosis) 01/08/2016  . ESRD (end stage renal disease) on dialysis [facility])     Carey Bullocks, OTR/L 04/03/2016, 11:54 AM  [facility] [address] [place], [place], [address] Phone: [phone]   Fax:  [phone]  Name: Isaiah [name] [name] MRN: [id number] Date of Birth: [birth year]

## 2016-04-03 NOTE — Telephone Encounter (Signed)
Left message for patient's wife to call office.

## 2016-04-03 NOTE — Telephone Encounter (Signed)
We have not seen patient since Jan. 2016. Will need office visit or need to find out who originally ordered CPAP machine.

## 2016-04-03 NOTE — Telephone Encounter (Signed)
We did not do previous sleep study. Patient should call doctor who did previous study and have them schedule a new study. Tammy spoke with Dr. Carlis Abbott office last week when they faxed over a request for new supplies.

## 2016-04-03 NOTE — Therapy (Signed)
[facility] [address] [place], [place], [address] Phone: [phone]   Fax:  [phone]  Speech Language Pathology Treatment  Patient Details  Name: Isaiah [name] [facility] MRN: [id number] Date of Birth: 04/08/1966 Referring Provider: Alysia Penna, MD  Encounter Date: 04/03/2016      End of Session - 04/03/16 1206    Visit Number 10   Number of Visits 20   Date for SLP Re-Evaluation 05/02/16   Authorization Type Lorella Nimrod auth requested   SLP Start Time 0932   SLP Stop Time  1015   SLP Time Calculation (min) 43 min      Past Medical History:  Diagnosis Date  . A-fib ([place])   . Acute renal failure ([place]) 07/2011  . Anemia   . Asthma   . Diabetes mellitus without complication ([place])   . ESRD (end stage renal disease) on dialysis ([place])   . Gout   . History of recent blood transfusion 10/27/14   2 Units PRBC's  . Hyperkalemia 07/2011  . Hypertension   . Monoclonal gammopathies   . Monoclonal gammopathy   . OSA on CPAP   . Pulmonary embolism ([place]) 07/2011   a. Tx with Coumadin for 6 months (unknown cause per patient).  . Pulmonary embolism ([place]) 07/2011; 09/27/2014   a. Bilat PE 07/2011 - unclear cause, tx with 6 months Coumadin.;   . Seizures ([place])   . Sickle cell-thalassemia disease ([place])    a. Sickle cell trait.  . Sleep apnea    a. uses CPAP.  [name] Stroke [name])     Past Surgical History:  Procedure Laterality Date  . AV FISTULA PLACEMENT Right 12/05/2015   Procedure: INSERTION OF ARTERIOVENOUS (AV) GORE-TEX GRAFT ARM;  Surgeon: Elam Dutch, MD;  Location: [facility];  Service: Vascular;  Laterality: Right;  . BONE MARROW BIOPSY    . CHOLECYSTECTOMY    . CHOLECYSTECTOMY  1990's?  . EYE SURGERY Right   . INSERTION OF DIALYSIS CATHETER Right 10/16/2015   Procedure: INSERTION OF PALINDROME DIALYSIS CATHETER ;  Surgeon: Elam Dutch, MD;  Location: [place];  Service: Vascular;  Laterality: Right;  . OTHER  SURGICAL HISTORY     Retinal surgery  . PARS PLANA VITRECTOMY  02/17/2012   Procedure: PARS PLANA VITRECTOMY WITH 25 GAUGE;  Surgeon: Hayden Pedro, MD;  Location: [facility];  Service: Ophthalmology;  Laterality: Right;  . PERIPHERAL VASCULAR CATHETERIZATION N/A 03/20/2016   Procedure: A/V Shuntogram/Fistulagram;  Surgeon: Conrad , MD;  Location: [place] [facility];  Service: Cardiovascular;  Laterality: N/A;    There were no vitals filed for this visit.      Subjective Assessment - 04/03/16 0939    Subjective "I did your simle math problems better than the complex ones"   Currently in Pain? No/denies               ADULT SLP TREATMENT - 04/03/16 0939      General Information   Behavior/Cognition Alert;Cooperative;Pleasant mood     Treatment Provided   Treatment provided Cognitive-Linquistic     Pain Assessment   Pain Assessment No/denies pain     Cognitive-Linquistic Treatment   Treatment focused on Apraxia;Cognition   Skilled Treatment Pt's speech notably improved - with mild vowel distortions. Cognition: facilitated simple time and money problem sovling using sign for bank hours and a deli menu - pt required occasional to usual mod cues for attention to details in the questions and  occasional to mod cues to perform simple math . Simple time problems using clock in room with usual mod A and extended time, including practice counting by 5's, 10's and 2's.     Assessment / Recommendations / Plan   Plan Continue with current plan of care     Progression Toward Goals   Progression toward goals Progressing toward goals          SLP Education - 04/03/16 1159    Education provided Yes   Education Details apps for math practice   Person(s) Educated Patient   Methods Explanation;Other (comment)  written cues on homework          SLP Short Term Goals - 04/03/16 1205      SLP SHORT TERM GOAL #1   Title pt will demo HEP for apraxia/motor plannning with  independence over 3 sessions   Baseline began new POC 02-21-16   Time 3   Period --  more visits, for all STGs   Status On-going     SLP SHORT TERM GOAL #2   Title pt will demo HEP for dysarthria with independence over 3 sessions   Baseline 03/27/16, 04/03/16   Time 3   Period --  visits   Status On-going     SLP SHORT TERM GOAL #3   Title pt will demo speech 95% accurate in sentence-length responses with modified independence (compensations) over 3 sessions   Baseline 04/03/16   Time 3   Period --  visits   Status On-going     SLP SHORT TERM GOAL #4   Title pt will provide 4 memory strategies to SLP   Time 3   Period --  visits   Status On-going     SLP SHORT TERM GOAL #5   Title pt will demo simple math, time, money tasks with 80% success   Time 3   Period --  visits   Status On-going          SLP Long Term Goals - 04/03/16 1206      SLP LONG TERM GOAL #1   Title pt will demo functional speech in 20 minutes mod complex/complex conversation with modified independnence   Baseline new POC on 02-21-16   Time 11   Period Weeks   more visits, for all LTGs   Status On-going     SLP LONG TERM GOAL #2   Title pt will demo HEP for speech-motor planning indpendently over 5 total sessions    Time 11   Period --  visits   Status On-going     SLP LONG TERM GOAL #3   Title pt will demo HEP for dysarthria independently over 5 total sessions   Time 11   Period --  visits   Status On-going     SLP LONG TERM GOAL #4   Title pt will demo functional organizational/sequencing ability for cognitive communication tasks   Time 11   Period --  visits   Status On-going     SLP LONG TERM GOAL #5   Title pt will demo use of one memory strategy in 3 sessions   Time 11   Period --  visits   Status On-going          Plan - 04/03/16 1203    Clinical Impression Statement Pt continues to exhibit severely reduced simple time and money problem solving - requires usual mod  A and significantly extended time for simple problems. Continue skilled ST to maximize  cognition for independence and possible return to work in some capacity.   Treatment/Interventions SLP instruction and feedback;Compensatory strategies;Internal/external aids;Oral motor exercises;Patient/family education;Functional tasks;Cueing hierarchy;Cognitive reorganization   Potential to Achieve Goals Good   Potential Considerations Severity of impairments   Consulted and Agree with Plan of Care Patient      Patient will benefit from skilled therapeutic intervention in order to improve the following deficits and impairments:   Cognitive communication deficit    Problem List Patient Active Problem List   Diagnosis Date Noted  . Hemiparesis affecting right side as late effect of cerebrovascular accident ([place]) 02/28/2016  . Aphasia, late effect of cerebrovascular disease 02/28/2016  . Acute encephalopathy 01/08/2016  . Seizures ([place]) 01/08/2016  . History of ischemic left MCA stroke 01/08/2016  . Right sided weakness 01/08/2016  . Chronic combined systolic and diastolic CHF (congestive heart failure) ([place]) 01/08/2016  . Atrial fibrillation ([place]) 01/08/2016  . Leukocytosis 01/08/2016  . Benign essential HTN 01/08/2016  . Anemia of chronic disease 01/08/2016  . Controlled type 2 diabetes mellitus with diabetic nephropathy, without long-term current use of insulin ([place]) 01/08/2016  . History of DVT (deep vein thrombosis) 01/08/2016  . ESRD (end stage renal disease) on dialysis [facility])     Trevia Nop, Annye Rusk MS, CCC-SLP 04/03/2016, 12:07 PM  [facility] [address] [place], [place], [address] Phone: [phone]   Fax:  [phone]   Name: Isaiah [name] [name] MRN: [id number] Date of Birth: 1966/03/26

## 2016-04-04 ENCOUNTER — Encounter: Payer: Self-pay | Admitting: Vascular Surgery

## 2016-04-04 DIAGNOSIS — Z7901 Long term (current) use of anticoagulants: Secondary | ICD-10-CM | POA: Diagnosis not present

## 2016-04-04 DIAGNOSIS — D688 Other specified coagulation defects: Secondary | ICD-10-CM | POA: Diagnosis not present

## 2016-04-04 DIAGNOSIS — D509 Iron deficiency anemia, unspecified: Secondary | ICD-10-CM | POA: Diagnosis not present

## 2016-04-04 DIAGNOSIS — N186 End stage renal disease: Secondary | ICD-10-CM | POA: Diagnosis not present

## 2016-04-04 DIAGNOSIS — T8249XA Other complication of vascular dialysis catheter, initial encounter: Secondary | ICD-10-CM | POA: Diagnosis not present

## 2016-04-04 DIAGNOSIS — D631 Anemia in chronic kidney disease: Secondary | ICD-10-CM | POA: Diagnosis not present

## 2016-04-04 DIAGNOSIS — E1129 Type 2 diabetes mellitus with other diabetic kidney complication: Secondary | ICD-10-CM | POA: Diagnosis not present

## 2016-04-04 NOTE — Telephone Encounter (Signed)
Wife stated she Could not find the info about the CPAP can you please go ahead and schedule a sleep study.  Patient has dialysis on M,W,F. Please scheulde on Tuesdays or Thursdays.  Please Advise

## 2016-04-04 NOTE — Telephone Encounter (Signed)
Informed wife patient needs appointment. Schedule appt for Tues. 8/1 @ 1:30

## 2016-04-07 ENCOUNTER — Encounter: Payer: Self-pay | Admitting: Physical Therapy

## 2016-04-07 ENCOUNTER — Ambulatory Visit: Payer: 59 | Admitting: Occupational Therapy

## 2016-04-07 ENCOUNTER — Encounter: Payer: Self-pay | Admitting: Occupational Therapy

## 2016-04-07 ENCOUNTER — Ambulatory Visit: Payer: 59

## 2016-04-07 ENCOUNTER — Ambulatory Visit: Payer: 59 | Admitting: Physical Therapy

## 2016-04-07 DIAGNOSIS — R2681 Unsteadiness on feet: Secondary | ICD-10-CM | POA: Diagnosis not present

## 2016-04-07 DIAGNOSIS — R2689 Other abnormalities of gait and mobility: Secondary | ICD-10-CM | POA: Diagnosis not present

## 2016-04-07 DIAGNOSIS — I69315 Cognitive social or emotional deficit following cerebral infarction: Secondary | ICD-10-CM | POA: Diagnosis not present

## 2016-04-07 DIAGNOSIS — R482 Apraxia: Secondary | ICD-10-CM | POA: Diagnosis not present

## 2016-04-07 DIAGNOSIS — R278 Other lack of coordination: Secondary | ICD-10-CM | POA: Diagnosis not present

## 2016-04-07 DIAGNOSIS — I69351 Hemiplegia and hemiparesis following cerebral infarction affecting right dominant side: Secondary | ICD-10-CM | POA: Diagnosis not present

## 2016-04-07 DIAGNOSIS — M6281 Muscle weakness (generalized): Secondary | ICD-10-CM

## 2016-04-07 DIAGNOSIS — E1122 Type 2 diabetes mellitus with diabetic chronic kidney disease: Secondary | ICD-10-CM | POA: Diagnosis not present

## 2016-04-07 DIAGNOSIS — R471 Dysarthria and anarthria: Secondary | ICD-10-CM

## 2016-04-07 DIAGNOSIS — R41841 Cognitive communication deficit: Secondary | ICD-10-CM

## 2016-04-07 DIAGNOSIS — Z992 Dependence on renal dialysis: Secondary | ICD-10-CM | POA: Diagnosis not present

## 2016-04-07 DIAGNOSIS — N186 End stage renal disease: Secondary | ICD-10-CM | POA: Diagnosis not present

## 2016-04-07 NOTE — Therapy (Signed)
[facility] [address] [place], [place], [address] Phone: [phone]   Fax:  [phone]  Occupational Therapy Treatment  Patient Details  Name: Isaiah [name] MRN: [id number] Date of Birth: 1966-08-17 Referring Provider: Alysia Penna  Encounter Date: 04/07/2016      OT End of Session - 04/07/16 1254    Visit Number 7   Number of Visits 17   Date for OT Re-Evaluation 05/01/16   Authorization Type medicare   Authorization Time Period "$20 co pay, no visit limit, no auth required"   Authorization - Visit Number 7   Authorization - Number of Visits 10   OT Start Time 0849   OT Stop Time 0931   OT Time Calculation (min) 42 min   Activity Tolerance Patient tolerated treatment well      Past Medical History:  Diagnosis Date  . A-fib ([place])   . Acute renal failure ([place]) 07/2011  . Anemia   . Asthma   . Diabetes mellitus without complication ([place])   . ESRD (end stage renal disease) on dialysis ([place])   . Gout   . History of recent blood transfusion 10/27/14   2 Units PRBC's  . Hyperkalemia 07/2011  . Hypertension   . Monoclonal gammopathies   . Monoclonal gammopathy   . OSA on CPAP   . Pulmonary embolism ([place]) 07/2011   a. Tx with Coumadin for 6 months (unknown cause per patient).  . Pulmonary embolism ([place]) 07/2011; 09/27/2014   a. Bilat PE 07/2011 - unclear cause, tx with 6 months Coumadin.;   . Seizures ([place])   . Sickle cell-thalassemia disease ([place])    a. Sickle cell trait.  . Sleep apnea    a. uses CPAP.  [name] Stroke [facility])     Past Surgical History:  Procedure Laterality Date  . AV FISTULA PLACEMENT Right 12/05/2015   Procedure: INSERTION OF ARTERIOVENOUS (AV) GORE-TEX GRAFT ARM;  Surgeon: Elam Dutch, MD;  Location: [facility];  Service: Vascular;  Laterality: Right;  . BONE MARROW BIOPSY    . CHOLECYSTECTOMY    . CHOLECYSTECTOMY  1990's?  . EYE SURGERY Right   . INSERTION OF DIALYSIS CATHETER  Right 10/16/2015   Procedure: INSERTION OF PALINDROME DIALYSIS CATHETER ;  Surgeon: Elam Dutch, MD;  Location: [facility];  Service: Vascular;  Laterality: Right;  . OTHER SURGICAL HISTORY     Retinal surgery  . PARS PLANA VITRECTOMY  02/17/2012   Procedure: PARS PLANA VITRECTOMY WITH 25 GAUGE;  Surgeon: Hayden Pedro, MD;  Location: [facility];  Service: Ophthalmology;  Laterality: Right;  . PERIPHERAL VASCULAR CATHETERIZATION N/A 03/20/2016   Procedure: A/V Shuntogram/Fistulagram;  Surgeon: Conrad Holden, MD;  Location: [facility];  Service: Cardiovascular;  Laterality: N/A;    There were no vitals filed for this visit.      Subjective Assessment - 04/07/16 0853    Subjective  My hand gets tight   Pertinent History see Epic    Patient Stated Goals to use right hand better   Currently in Pain? No/denies                      OT Treatments/Exercises (OP) - 04/07/16 0001      ADLs   LB Dressing Practiced tying shoe on table using both hands. Pt to practice at home 10 minutes 2-3 times per day. Pt is able to tie shoes in this manner.    Writing  For first time today pt able to print his first name legibly. Due to aphasia pt unable to spell last name. Pt was able to trace last name. Pt given work sheet to practice tracing last name.     Neurological Re-education Exercises   Other Exercises 2 Neuro re ed to address functional use of R hand with manipulation and fine motor tasks .  Pt requires cueing to look at left hand when using and increased time but is beginning to be able to manipulate smalller items in hand.      Manual Therapy   Manual Therapy Joint mobilization;Soft tissue mobilization   Manual therapy comments Joint mob and soft tissue mob to foster full composite flexion as well as decrease stiffness prior to activity requiring fine motorl manipulation              Balance Exercises - 04/07/16 1008      Balance Exercises: Standing   Stepping Strategy  Anterior;Posterior;Lateral;Foam/compliant surface;UE support  intermittent UE support   Tandem Gait Forward;Upper extremity support;Foam/compliant surface   Sidestepping Foam/compliant support;Upper extremity support             OT Short Term Goals - 04/07/16 1251      OT SHORT TERM GOAL #1   Title I with initiall HEP- 03/20/2016   Time 4   Period Weeks   Status Achieved     OT SHORT TERM GOAL #2   Title Pt will perform all basic ADLS with supervision.   Time 4   Period Weeks   Status Achieved     OT SHORT TERM GOAL #3   Title Patient will demonstrate improved functional strength in right UE as evidenced by brushing teeth with dominant right hand   Time 4   Period Weeks   Status Achieved     OT SHORT TERM GOAL #4   Title Pt will demonstrate ability to retrieve a light weight (less than 1 lb) object at 120 shoulder flexion with RUE.   Time 4   Period Weeks   Status Achieved  3 pound object     OT SHORT TERM GOAL #5   Title Patient will demostrate improved coordination for right hand as evidenced by 15 on box and blocks test   Time 4   Period Weeks   Status Achieved  25 on 03/12/2016     OT SHORT TERM GOAL #6   Title Pt will report feeding himself with RUE 75% of the time.   Time 4   Period Weeks   Status Achieved           OT Long Term Goals - 04/07/16 1251      OT LONG TERM GOAL #1   Title I with updated HEP.- 04/17/2016   Time 8   Period Weeks   Status On-going     OT LONG TERM GOAL #2   Title Pt will demonstrate ability to write his name legibily.   Time 8   Period Weeks   Status On-going     OT LONG TERM GOAL #3   Title Patient will demonstrate adequate strength and coordination in dominant right hand to tighten laces and tie shoes using bilateral technique   Time 8   Period Weeks   Status On-going     OT LONG TERM GOAL #4   Title Assess 9 hole peg test and determine goal   Status Achieved  pt unable to do 1 in 1 minute time  OT  LONG TERM GOAL #5   Title Pt will demonstrate RUE grip strength of 20+ lbs for increased functional use.   Time 8   Period Weeks   Status On-going  12 pounds on 03/18/2016 using 3rd notch     OT LONG TERM GOAL #6   Title Pt will perform all basic ADLs and simple meal prep modified independently.   Time 8   Period Weeks   Status On-going               Plan - 04/07/16 1252    Clinical Impression Statement Pt progressing toward goals. Pt has better ability to use R hand than pt realizes.   Rehab Potential Good   Clinical Impairments Affecting Rehab Potential aphasia, L inattention, cognitive impairmen   OT Frequency 2x / week   OT Duration 8 weeks   OT Treatment/Interventions Self-care/ADL training;Therapeutic exercise;Patient/family education;Balance training;Splinting;Manual Therapy;Neuromuscular education;Ultrasound;Therapeutic exercises;Therapeutic activities;DME and/or AE instruction;Parrafin;Cryotherapy;Electrical Stimulation;Fluidtherapy;Scar mobilization;Cognitive remediation/compensation;Moist Heat;Contrast Bath;Passive range of motion;Visual/perceptual remediation/compensation;Functional Mobility Training   Plan NMR for RUE, continue coordination, writing   OT Home Exercise Plan Education issued:  12/31/15 initial HEP (coordination, cane ex);  added putty on 02/26/2016   Consulted and Agree with Plan of Care Patient      Patient will benefit from skilled therapeutic intervention in order to improve the following deficits and impairments:  Cardiopulmonary status limiting activity, Decreased activity tolerance, Decreased balance, Decreased cognition, Decreased endurance, Decreased coordination, Decreased strength, Increased edema, Impaired tone, Impaired UE functional use, Impaired vision/preception, Increased fascial restricitons  Visit Diagnosis: Other lack of coordination  Unsteadiness on feet  Apraxia  Hemiplegia and hemiparesis following cerebral infarction  affecting right dominant side (HCC)  Muscle weakness (generalized)  Cognitive social or emotional deficit following cerebral infarction    Problem List Patient Active Problem List   Diagnosis Date Noted  . Hemiparesis affecting right side as late effect of cerebrovascular accident ([place]) 02/28/2016  . Aphasia, late effect of cerebrovascular disease 02/28/2016  . Acute encephalopathy 01/08/2016  . Seizures ([place]) 01/08/2016  . History of ischemic left MCA stroke 01/08/2016  . Right sided weakness 01/08/2016  . Chronic combined systolic and diastolic CHF (congestive heart failure) ([place]) 01/08/2016  . Atrial fibrillation ([place]) 01/08/2016  . Leukocytosis 01/08/2016  . Benign essential HTN 01/08/2016  . Anemia of chronic disease 01/08/2016  . Controlled type 2 diabetes mellitus with diabetic nephropathy, without long-term current use of insulin ([place]) 01/08/2016  . History of DVT (deep vein thrombosis) 01/08/2016  . ESRD (end stage renal disease) on dialysis [facility])     Quay Burow, OTR/L 04/07/2016, 12:56 PM  [facility] [address] [place], [place], [address] Phone: [phone]   Fax:  [phone]  Name: Isaiah [name] [facility] MRN: [id number] Date of Birth: 11-16-1965

## 2016-04-07 NOTE — Therapy (Signed)
[facility] [address] [place], [place], [address] Phone: [phone]   Fax:  [phone]  Physical Therapy Treatment  Patient Details  Name: Isaiah [name] [facility] MRN: [id number] Date of Birth: 22-Feb-1966 Referring Provider: Dr. Letta Pate  Encounter Date: 04/07/2016      PT End of Session - 04/07/16 1015    Visit Number 11   Number of Visits 17   Date for PT Re-Evaluation 04/22/16   Authorization Type BCBS   PT Start Time 0930   PT Stop Time 1012   PT Time Calculation (min) 42 min   Activity Tolerance Patient tolerated treatment well      Past Medical History:  Diagnosis Date  . A-fib ([place])   . Acute renal failure ([place]) 07/2011  . Anemia   . Asthma   . Diabetes mellitus without complication ([place])   . ESRD (end stage renal disease) on dialysis ([place])   . Gout   . History of recent blood transfusion 10/27/14   2 Units PRBC's  . Hyperkalemia 07/2011  . Hypertension   . Monoclonal gammopathies   . Monoclonal gammopathy   . OSA on CPAP   . Pulmonary embolism ([place]) 07/2011   a. Tx with Coumadin for 6 months (unknown cause per patient).  . Pulmonary embolism ([place]) 07/2011; 09/27/2014   a. Bilat PE 07/2011 - unclear cause, tx with 6 months Coumadin.;   . Seizures ([place])   . Sickle cell-thalassemia disease ([place])    a. Sickle cell trait.  . Sleep apnea    a. uses CPAP.  [name] Stroke [facility])     Past Surgical History:  Procedure Laterality Date  . AV FISTULA PLACEMENT Right 12/05/2015   Procedure: INSERTION OF ARTERIOVENOUS (AV) GORE-TEX GRAFT ARM;  Surgeon: Elam Dutch, MD;  Location: [facility];  Service: Vascular;  Laterality: Right;  . BONE MARROW BIOPSY    . CHOLECYSTECTOMY    . CHOLECYSTECTOMY  1990's?  . EYE SURGERY Right   . INSERTION OF DIALYSIS CATHETER Right 10/16/2015   Procedure: INSERTION OF PALINDROME DIALYSIS CATHETER ;  Surgeon: Elam Dutch, MD;  Location: [facility];  Service: Vascular;  Laterality:  Right;  . OTHER SURGICAL HISTORY     Retinal surgery  . PARS PLANA VITRECTOMY  02/17/2012   Procedure: PARS PLANA VITRECTOMY WITH 25 GAUGE;  Surgeon: Hayden Pedro, MD;  Location: [facility];  Service: Ophthalmology;  Laterality: Right;  . PERIPHERAL VASCULAR CATHETERIZATION N/A 03/20/2016   Procedure: A/V Shuntogram/Fistulagram;  Surgeon: Conrad [place], MD;  Location: [facility];  Service: Cardiovascular;  Laterality: N/A;    There were no vitals filed for this visit.      Subjective Assessment - 04/07/16 0932    Subjective Pt reports no balance issues.  Thinks that he may be getting better with right foot clearance.   Currently in Pain? No/denies                         [facility] Adult PT Treatment/Exercise - 04/07/16 0001      Ambulation/Gait   Ambulation/Gait Yes   Ambulation/Gait Assistance 6: Modified independent (Device/Increase time)  Working on balance and right foot clearance on uneven surface   Ambulation/Gait Assistance Details Gait trainer- 10 min continuous with speed 1.5-1.72mh, working on speed and heel toe gait pattern, good overall right foot clearance.  Pt reporting being challange to activity tolerance.   Ambulation Distance (Feet) 500 Feet  Assistive device None   Gait Pattern Step-through pattern;Decreased dorsiflexion - right;Poor foot clearance - right;Trunk flexed   Ambulation Surface Unlevel;Level;Outdoor;Grass  progressing with head turns and changes in directions             Balance Exercises - 04/07/16 1008      Balance Exercises: Standing   Stepping Strategy Anterior;Posterior;Lateral;Foam/compliant surface;UE support  intermittent UE support   Tandem Gait Forward;Upper extremity support;Foam/compliant surface   Sidestepping Foam/compliant support;Upper extremity support           PT Education - 04/07/16 1012    Education provided Yes   Education Details stepping/ weight shifting strategies progressing on compliant  surface.   Person(s) Educated Patient   Methods Explanation;Demonstration;Tactile cues;Verbal cues   Comprehension Verbalized understanding;Returned demonstration;Verbal cues required;Tactile cues required;Need further instruction          PT Short Term Goals - 04/03/16 1440      PT SHORT TERM GOAL #1   Title Pt will perform HEP independenlty for improved strength, balance and gait.  TARGET 03/23/16   Baseline Per progress note on 03/27/16   Time 4   Period Weeks   Status Partially Met     PT SHORT TERM GOAL #2   Title Pt will improve Berg Score to at least 49/56 for decreased fall risk.   Baseline per progress note. 49/56.  03/27/16   Time 4   Period Weeks   Status Achieved     PT SHORT TERM GOAL #3   Title Pt will improve TUG score to less than or equal to 13.5 seconds for decreased fall risk.   Baseline Per progress note, 8.84 seconds no AD. 03/27/16   Time 4   Period Weeks   Status Achieved     PT SHORT TERM GOAL #4   Title Pt will amb. 300' over even/uneven terrain with LRAD at MOD I level.   Baseline per progress not 03/27/16   Status Achieved           PT Long Term Goals - 02/22/16 1245      PT LONG TERM GOAL #1   Title Pt will verbalize understanding of fall prevention in the home environment.  TARGET 04/22/16   Time 8   Period Weeks   Status New     PT LONG TERM GOAL #2   Title Pt will improve Dynamic Gait Index score to at least 19/24 for decreased fall risk.   Time 8   Period Weeks   Status New     PT LONG TERM GOAL #3   Title Pt will improve single limb stance to at least 3 seconds each leg for improved negotiation of steps and obstacles.   Time 8   Period Weeks   Status New     PT LONG TERM GOAL #4   Title Pt will amb. 600' over even/uneven terrain IND without AD to improve functional moblity.    Time 8   Period Weeks   Status New     PT LONG TERM GOAL #5   Title Pt will ascend/descent 12 steps with one handrail at MOD I level to negotiate  steps at home.   Time 8   Period Weeks   Status New               Plan - 04/07/16 1017    Clinical Impression Statement Pt requires intermitent UE support for Alternate stepping strategies anterior/ posterior to coordinate and control movement; progressed activity on  compliant surface.  Had no LOB with dynamic gait on grass/ uneven surface.   Rehab Potential Good   Clinical Impairments Affecting Rehab Potential co-morbidities   PT Frequency 2x / week   PT Duration 8 weeks   PT Treatment/Interventions ADLs/Self Care Home Management;Therapeutic exercise;Therapeutic activities;Functional mobility training;Gait training;Balance training;Neuromuscular re-education;Patient/family education   PT Next Visit Plan Give handout for [place]; Try treadmill gait training to work on improved RLE foot clearance and heelstrike.  Add standing exercises with resistance band to HEP.  See if wife would be able to come in for a session to review HEP.   Consulted and Agree with Plan of Care Patient      Patient will benefit from skilled therapeutic intervention in order to improve the following deficits and impairments:  Abnormal gait, Decreased balance, Decreased mobility, Decreased strength, Difficulty walking, Postural dysfunction  Visit Diagnosis: Other lack of coordination  Other abnormalities of gait and mobility  Unsteadiness on feet     Problem List Patient Active Problem List   Diagnosis Date Noted  . Hemiparesis affecting right side as late effect of cerebrovascular accident ([place]) 02/28/2016  . Aphasia, late effect of cerebrovascular disease 02/28/2016  . Acute encephalopathy 01/08/2016  . Seizures ([place]) 01/08/2016  . History of ischemic left MCA stroke 01/08/2016  . Right sided weakness 01/08/2016  . Chronic combined systolic and diastolic CHF (congestive heart failure) ([place]) 01/08/2016  . Atrial fibrillation ([place]) 01/08/2016  . Leukocytosis 01/08/2016  . Benign  essential HTN 01/08/2016  . Anemia of chronic disease 01/08/2016  . Controlled type 2 diabetes mellitus with diabetic nephropathy, without long-term current use of insulin ([place]) 01/08/2016  . History of DVT (deep vein thrombosis) 01/08/2016  . ESRD (end stage renal disease) on dialysis [facility])     Bjorn Loser, PTA  04/07/16, 1:04 PM  [facility] [address] [place], [place], [address] Phone: [phone]   Fax:  [phone]  Name: Isaiah [name] [name] MRN: [id number] Date of Birth: Aug 09, 1966

## 2016-04-07 NOTE — Therapy (Signed)
[facility] [address] [place], [place], [address] Phone: [phone]   Fax:  [phone]  Speech Language Pathology Treatment  Patient Details  Name: Isaiah [name] [facility] MRN: [id number] Date of Birth: [birth year] Referring Provider: Alysia Penna, MD  Encounter Date: 04/07/2016      End of Session - 04/07/16 0852    Visit Number 11   Number of Visits 20   Date for SLP Re-Evaluation 05/02/16   SLP Start Time 0808  pt 7 minutes late   SLP Stop Time  0848   SLP Time Calculation (min) 40 min      Past Medical History:  Diagnosis Date  . A-fib ([place])   . Acute renal failure ([place]) 07/2011  . Anemia   . Asthma   . Diabetes mellitus without complication ([place])   . ESRD (end stage renal disease) on dialysis ([place])   . Gout   . History of recent blood transfusion 10/27/14   2 Units PRBC's  . Hyperkalemia 07/2011  . Hypertension   . Monoclonal gammopathies   . Monoclonal gammopathy   . OSA on CPAP   . Pulmonary embolism ([place]) 07/2011   a. Tx with Coumadin for 6 months (unknown cause per patient).  . Pulmonary embolism ([place]) 07/2011; 09/27/2014   a. Bilat PE 07/2011 - unclear cause, tx with 6 months Coumadin.;   . Seizures ([place])   . Sickle cell-thalassemia disease ([place])    a. Sickle cell trait.  . Sleep apnea    a. uses CPAP.  [name] Stroke [facility])     Past Surgical History:  Procedure Laterality Date  . AV FISTULA PLACEMENT Right 12/05/2015   Procedure: INSERTION OF ARTERIOVENOUS (AV) GORE-TEX GRAFT ARM;  Surgeon: Elam Dutch, MD;  Location: [facility];  Service: Vascular;  Laterality: Right;  . BONE MARROW BIOPSY    . CHOLECYSTECTOMY    . CHOLECYSTECTOMY  1990's?  . EYE SURGERY Right   . INSERTION OF DIALYSIS CATHETER Right 10/16/2015   Procedure: INSERTION OF PALINDROME DIALYSIS CATHETER ;  Surgeon: Elam Dutch, MD;  Location: [facility];  Service: Vascular;  Laterality: Right;  . OTHER SURGICAL HISTORY     Retinal  surgery  . PARS PLANA VITRECTOMY  02/17/2012   Procedure: PARS PLANA VITRECTOMY WITH 25 GAUGE;  Surgeon: Hayden Pedro, MD;  Location: [place];  Service: Ophthalmology;  Laterality: Right;  . PERIPHERAL VASCULAR CATHETERIZATION N/A 03/20/2016   Procedure: A/V Shuntogram/Fistulagram;  Surgeon: Conrad , MD;  Location: [place] [facility];  Service: Cardiovascular;  Laterality: N/A;    There were no vitals filed for this visit.      Subjective Assessment - 04/07/16 0811    Subjective Pt returned last evening at 1 AM from [place] - attended memorial service.   Currently in Pain? No/denies               ADULT SLP TREATMENT - 04/07/16 0825      General Information   Behavior/Cognition Alert;Cooperative;Pleasant mood     Treatment Provided   Treatment provided Cognitive-Linquistic     Cognitive-Linquistic Treatment   Treatment focused on Apraxia;Cognition   Skilled Treatment (speech tx): Pt was independent with dysarthria HEP, and pt told SLP 20 of phrases focusing on apraxia with 30% success.  (cognition 17 minutes): "I really have to focus my thougths before I do a new task now." Pt showed good anticipatory awareness with return (or no return) to work. "Right now I've  just resolved myself to doing the best I can." SLP engaged pt in problem solving (verbal, from SLP) and pt req'd mod-max cues consistently to solve simple problems.      Assessment / Recommendations / Plan   Plan Continue with current plan of care     Progression Toward Goals   Progression toward goals Not progressing toward goals (comment)  SLP ? pt's fatigue level hindering progress            SLP Short Term Goals - 04/07/16 0855      SLP SHORT TERM GOAL #1   Title pt will demo HEP for apraxia/motor plannning with independence over 3 sessions   Baseline began new POC 02-21-16   Time 1   Period --   visits, for all STGs   Status Not Met     SLP SHORT TERM GOAL #2   Title pt will demo HEP for  dysarthria with independence over 3 sessions   Baseline 03/27/16, 04/03/16, 04-07-16   Time 1   Period --  visits   Status Achieved     SLP SHORT TERM GOAL #3   Title pt will demo speech 95% accurate in sentence-length responses with modified independence (compensations) over 3 sessions   Baseline --   Time --   Period --   Status Partially Met  one of three sessions     SLP SHORT TERM GOAL #4   Title pt will provide 4 memory strategies to SLP   Time --   Period --   Status Deferred  due to work with intelligibility and simple problem solviing     SLP SHORT TERM GOAL #5   Title pt will demo simple math, time, money tasks with 80% success   Time --   Period --   Status Not Met          SLP Long Term Goals - 04/07/16 5573      SLP LONG TERM GOAL #1   Title pt will demo functional speech in 20 minutes mod complex/complex conversation with modified independnence   Baseline new POC on 02-21-16   Time 9   Period --  visits, for all LTGs   Status On-going     SLP LONG TERM GOAL #2   Title pt will demo HEP for speech-motor planning indpendently over 3 total sessions    Time 9   Period --  visits   Status Revised     SLP LONG TERM GOAL #3   Title pt will demo HEP for dysarthria independently over 5 total sessions   Baseline 04-07-16   Time 9   Period --  visits   Status On-going     SLP LONG TERM GOAL #4   Title pt will demo functional organizational/sequencing ability for simple cognitive communication tasks   Time 9   Period --  visits   Status On-going     SLP LONG TERM GOAL #5   Title pt will demo evidence of memory system initiated   Time 9   Period --  visits   Status Revised          Plan - 04/07/16 0824    Clinical Impression Statement Pt's speech appeared worse today than last week - ? pt's energy/stamina level today given family responsibilities over the weekend. Pt continues to exhibit severely reduced simple time and money problem solving -  required consistent mod-max A today for simple problems. Continue skilled ST to maximize speech intelligibilty, and  cognition for independence and possible return to work in some capacity.   Speech Therapy Frequency 2x / week   Duration --  9 more visits   Treatment/Interventions SLP instruction and feedback;Compensatory strategies;Internal/external aids;Oral motor exercises;Patient/family education;Functional tasks;Cueing hierarchy;Cognitive reorganization   Potential to Achieve Goals Fair   Potential Considerations Severity of impairments   Consulted and Agree with Plan of Care Patient      Patient will benefit from skilled therapeutic intervention in order to improve the following deficits and impairments:   Apraxia  Cognitive communication deficit  Dysarthria and anarthria    Problem List Patient Active Problem List   Diagnosis Date Noted  . Hemiparesis affecting right side as late effect of cerebrovascular accident ([place]) 02/28/2016  . Aphasia, late effect of cerebrovascular disease 02/28/2016  . Acute encephalopathy 01/08/2016  . Seizures ([place]) 01/08/2016  . History of ischemic left MCA stroke 01/08/2016  . Right sided weakness 01/08/2016  . Chronic combined systolic and diastolic CHF (congestive heart failure) ([place]) 01/08/2016  . Atrial fibrillation ([place]) 01/08/2016  . Leukocytosis 01/08/2016  . Benign essential HTN 01/08/2016  . Anemia of chronic disease 01/08/2016  . Controlled type 2 diabetes mellitus with diabetic nephropathy, without long-term current use of insulin ([place]) 01/08/2016  . History of DVT (deep vein thrombosis) 01/08/2016  . ESRD (end stage renal disease) on dialysis [facility])     [facility] [place] ,[place], [place]  04/07/2016, 12:04 PM  [facility] [address] [place], [place], [address] Phone: [phone]   Fax:  [phone]   Name: Isaiah [name] [facility] MRN: [id number] Date of Birth: [birth year]

## 2016-04-07 NOTE — Patient Instructions (Signed)
  Please complete the assigned speech therapy homework and return it to your next session.

## 2016-04-08 ENCOUNTER — Ambulatory Visit: Payer: 59

## 2016-04-08 ENCOUNTER — Encounter: Payer: Self-pay | Admitting: Occupational Therapy

## 2016-04-08 ENCOUNTER — Other Ambulatory Visit (HOSPITAL_BASED_OUTPATIENT_CLINIC_OR_DEPARTMENT_OTHER): Payer: 59

## 2016-04-08 ENCOUNTER — Encounter: Payer: Self-pay | Admitting: Allergy and Immunology

## 2016-04-08 ENCOUNTER — Ambulatory Visit: Payer: 59 | Attending: Physical Medicine & Rehabilitation | Admitting: Occupational Therapy

## 2016-04-08 ENCOUNTER — Telehealth: Payer: Self-pay | Admitting: Oncology

## 2016-04-08 ENCOUNTER — Ambulatory Visit (INDEPENDENT_AMBULATORY_CARE_PROVIDER_SITE_OTHER): Payer: 59 | Admitting: Allergy and Immunology

## 2016-04-08 ENCOUNTER — Ambulatory Visit (HOSPITAL_BASED_OUTPATIENT_CLINIC_OR_DEPARTMENT_OTHER): Payer: 59 | Admitting: Oncology

## 2016-04-08 VITALS — BP 110/78 | HR 64 | Resp 16

## 2016-04-08 VITALS — BP 131/67 | HR 61 | Temp 97.9°F | Resp 19 | Wt 213.0 lb

## 2016-04-08 DIAGNOSIS — J4540 Moderate persistent asthma, uncomplicated: Secondary | ICD-10-CM

## 2016-04-08 DIAGNOSIS — R278 Other lack of coordination: Secondary | ICD-10-CM | POA: Diagnosis not present

## 2016-04-08 DIAGNOSIS — R482 Apraxia: Secondary | ICD-10-CM | POA: Insufficient documentation

## 2016-04-08 DIAGNOSIS — R471 Dysarthria and anarthria: Secondary | ICD-10-CM

## 2016-04-08 DIAGNOSIS — D472 Monoclonal gammopathy: Secondary | ICD-10-CM

## 2016-04-08 DIAGNOSIS — R2681 Unsteadiness on feet: Secondary | ICD-10-CM | POA: Diagnosis not present

## 2016-04-08 DIAGNOSIS — J309 Allergic rhinitis, unspecified: Secondary | ICD-10-CM | POA: Diagnosis not present

## 2016-04-08 DIAGNOSIS — N189 Chronic kidney disease, unspecified: Secondary | ICD-10-CM

## 2016-04-08 DIAGNOSIS — I69351 Hemiplegia and hemiparesis following cerebral infarction affecting right dominant side: Secondary | ICD-10-CM | POA: Diagnosis not present

## 2016-04-08 DIAGNOSIS — Z992 Dependence on renal dialysis: Secondary | ICD-10-CM

## 2016-04-08 DIAGNOSIS — Z8673 Personal history of transient ischemic attack (TIA), and cerebral infarction without residual deficits: Secondary | ICD-10-CM

## 2016-04-08 DIAGNOSIS — M6281 Muscle weakness (generalized): Secondary | ICD-10-CM | POA: Diagnosis not present

## 2016-04-08 DIAGNOSIS — D631 Anemia in chronic kidney disease: Secondary | ICD-10-CM

## 2016-04-08 DIAGNOSIS — H1010 Acute atopic conjunctivitis, unspecified eye: Secondary | ICD-10-CM

## 2016-04-08 DIAGNOSIS — E119 Type 2 diabetes mellitus without complications: Secondary | ICD-10-CM

## 2016-04-08 DIAGNOSIS — R41841 Cognitive communication deficit: Secondary | ICD-10-CM

## 2016-04-08 DIAGNOSIS — I69315 Cognitive social or emotional deficit following cerebral infarction: Secondary | ICD-10-CM | POA: Insufficient documentation

## 2016-04-08 DIAGNOSIS — R279 Unspecified lack of coordination: Secondary | ICD-10-CM | POA: Insufficient documentation

## 2016-04-08 DIAGNOSIS — R2689 Other abnormalities of gait and mobility: Secondary | ICD-10-CM | POA: Diagnosis not present

## 2016-04-08 DIAGNOSIS — I639 Cerebral infarction, unspecified: Secondary | ICD-10-CM | POA: Diagnosis not present

## 2016-04-08 DIAGNOSIS — G4730 Sleep apnea, unspecified: Secondary | ICD-10-CM

## 2016-04-08 DIAGNOSIS — I10 Essential (primary) hypertension: Secondary | ICD-10-CM | POA: Diagnosis not present

## 2016-04-08 DIAGNOSIS — Z86718 Personal history of other venous thrombosis and embolism: Secondary | ICD-10-CM

## 2016-04-08 LAB — CBC WITH DIFFERENTIAL/PLATELET
BASO%: 1.1 % (ref 0.0–2.0)
Basophils Absolute: 0.1 10*3/uL (ref 0.0–0.1)
EOS%: 2.9 % (ref 0.0–7.0)
Eosinophils Absolute: 0.4 10*3/uL (ref 0.0–0.5)
HCT: 33.8 % — ABNORMAL LOW (ref 38.4–49.9)
HGB: 11.1 g/dL — ABNORMAL LOW (ref 13.0–17.1)
LYMPH%: 14 % (ref 14.0–49.0)
MCH: 26.6 pg — ABNORMAL LOW (ref 27.2–33.4)
MCHC: 32.9 g/dL (ref 32.0–36.0)
MCV: 80.9 fL (ref 79.3–98.0)
MONO#: 1.4 10*3/uL — ABNORMAL HIGH (ref 0.1–0.9)
MONO%: 11.2 % (ref 0.0–14.0)
NEUT#: 9.2 10*3/uL — ABNORMAL HIGH (ref 1.5–6.5)
NEUT%: 70.8 % (ref 39.0–75.0)
Platelets: 485 10*3/uL — ABNORMAL HIGH (ref 140–400)
RBC: 4.18 10*6/uL — ABNORMAL LOW (ref 4.20–5.82)
RDW: 23.3 % — ABNORMAL HIGH (ref 11.0–14.6)
WBC: 13 10*3/uL — ABNORMAL HIGH (ref 4.0–10.3)
lymph#: 1.8 10*3/uL (ref 0.9–3.3)
nRBC: 10 % — ABNORMAL HIGH (ref 0–0)

## 2016-04-08 LAB — COMPREHENSIVE METABOLIC PANEL
ALT: 12 U/L (ref 0–55)
AST: 18 U/L (ref 5–34)
Albumin: 3.9 g/dL (ref 3.5–5.0)
Alkaline Phosphatase: 103 U/L (ref 40–150)
Anion Gap: 11 mEq/L (ref 3–11)
BUN: 57.8 mg/dL — ABNORMAL HIGH (ref 7.0–26.0)
CO2: 25 mEq/L (ref 22–29)
Calcium: 9.9 mg/dL (ref 8.4–10.4)
Chloride: 99 mEq/L (ref 98–109)
Creatinine: 9.7 mg/dL (ref 0.7–1.3)
EGFR: 6 mL/min/{1.73_m2} — ABNORMAL LOW (ref >90)
Glucose: 94 mg/dl (ref 70–140)
Potassium: 3.7 mEq/L (ref 3.5–5.1)
Sodium: 135 mEq/L — ABNORMAL LOW (ref 136–145)
Total Bilirubin: 0.89 mg/dL (ref 0.20–1.20)
Total Protein: 10.2 g/dL — ABNORMAL HIGH (ref 6.4–8.3)

## 2016-04-08 LAB — TECHNOLOGIST REVIEW

## 2016-04-08 MED ORDER — ALBUTEROL SULFATE HFA 108 (90 BASE) MCG/ACT IN AERS: INHALATION_SPRAY | RESPIRATORY_TRACT | 1 refills | Status: DC

## 2016-04-08 MED ORDER — FLUTICASONE FUROATE-VILANTEROL 100-25 MCG/INH IN AEPB: INHALATION_SPRAY | RESPIRATORY_TRACT | 5 refills | Status: DC

## 2016-04-08 MED FILL — VENTOLIN HFA 90 MCG INHALER: 108 (90 BAS | 16 days supply | Qty: 18 | Fill #0

## 2016-04-08 MED FILL — BREO ELLIPTA 100-25 MCG INH: 100-25 | 30 days supply | Qty: 60 | Fill #0

## 2016-04-08 NOTE — Telephone Encounter (Signed)
Gave pt cal & avs °

## 2016-04-08 NOTE — Progress Notes (Signed)
Follow-up Note  Referring Provider: Foye Spurling, MD Primary Provider: Foye Spurling, MD Date of Office Visit: 04/08/2016  Subjective:   Isaiah [name] (DOB: February 24, 1966) is a 50 y.o. male who returns to the [facility] on 04/08/2016 in re-evaluation of the following:  HPI: Isaiah [name] presents this clinic in evaluation of sleep apnea and asthma. I've not seen him in his clinic in approximately 18 months.  I initially saw him in his clinic for asthma and allergic rhinitis and during evaluation we diagnosed pulmonary embolus for which he received anticoagulation. In January of this year he developed a embolic stroke secondary to a PFO. Apparently his asthma is under very good control while using Breo 100 daily and rarely using a short acting bronchodilator. It does not sound as though he has required a systemic steroid to treat an exacerbation of asthma in over a year.  One issue for Isaiah [name] is a very confusing issue. Apparently in November 2015 prior to being evaluated in this clinic he was given a CPAP machine from a home health agency and instructed on how to use this machine and he's been using it ever since until last month at which time he was placed on Medicare and he attempted to get supplies for CPAP machine and was told that he needs to have another sleep study so that Medicare can document that there is a need for a CPAP machine. Interestingly, Isaiah [name] can never remember nor can his wife remember him having a sleep study. Apparently he went to the home health agency to pick up oxygen because it was at that point in time that his oxygen was low and he was given a CPAP machine. He denies any problems with disrupted sleep, snoring, or sleepiness during the daytime even without his CPAP machine.    Medication List      aspirin EC 81 MG tablet Take 1 tablet (81 mg total) by mouth daily.   enoxaparin 100 MG/ML injection Commonly known as:  LOVENOX Inject 1 mL (100 mg  total) into the skin daily.   epoetin alfa 10000 UNIT/ML injection Commonly known as:  EPOGEN,PROCRIT Inject 5,000 Units into the skin once a week. Reported on 01/17/2016   fluticasone furoate-vilanterol 100-25 MCG/INH Aepb Commonly known as:  BREO ELLIPTA Inhale 1 puff into the lungs daily.   Insulin Glargine 100 UNIT/ML Solostar Pen Commonly known as:  LANTUS Inject 10 Units into the skin daily at 10 pm.   Lacosamide 100 MG Tabs Commonly known as:  VIMPAT Take 1 tablet (100 mg total) by mouth 2 (two) times daily.   lidocaine-prilocaine cream Commonly known as:  EMLA as needed.   metoprolol tartrate 25 MG tablet Commonly known as:  LOPRESSOR Take 0.5 tablets (12.5 mg total) by mouth 2 (two) times daily.   midodrine 2.5 MG tablet Commonly known as:  PROAMATINE Take 1 tablet (2.5 mg total) by mouth 2 (two) times daily with a meal.   multivitamin Tabs tablet Take 1 tablet by mouth at bedtime.   NEEDLE (DISP) 27 G 27G X 1/2" Misc Commonly known as:  BD DISP NEEDLES 1 application by Does not apply route daily after supper.   pantoprazole 40 MG tablet Commonly known as:  PROTONIX Take 1 tablet (40 mg total) by mouth 2 (two) times daily.   SENSIPAR 60 MG tablet Generic drug:  cinacalcet Take by mouth.   UNIFINE PENTIPS 31G X 8 MM Misc Generic drug:  Insulin Pen Needle  warfarin 5 MG tablet Commonly known as:  COUMADIN Take 1.5 tablets (7.5 mg total) by mouth daily at 6 PM.       Past Medical History:  Diagnosis Date  . A-fib ([place])   . Acute renal failure ([place]) 07/2011  . Anemia   . Asthma   . Diabetes mellitus without complication ([place])   . ESRD (end stage renal disease) on dialysis ([place])   . Gout   . History of recent blood transfusion 10/27/14   2 Units PRBC's  . Hyperkalemia 07/2011  . Hypertension   . Monoclonal gammopathies   . Monoclonal gammopathy   . OSA on CPAP   . Pulmonary embolism ([place]) 07/2011   a. Tx with Coumadin for 6 months (unknown  cause per patient).  . Pulmonary embolism ([place]) 07/2011; 09/27/2014   a. Bilat PE 07/2011 - unclear cause, tx with 6 months Coumadin.;   . Seizures ([place])   . Sickle cell-thalassemia disease ([place])    a. Sickle cell trait.  . Sleep apnea    a. uses CPAP.  [name] Stroke [facility])     Past Surgical History:  Procedure Laterality Date  . AV FISTULA PLACEMENT Right 12/05/2015   Procedure: INSERTION OF ARTERIOVENOUS (AV) GORE-TEX GRAFT ARM;  Surgeon: Elam Dutch, MD;  Location: [facility];  Service: Vascular;  Laterality: Right;  . BONE MARROW BIOPSY    . CHOLECYSTECTOMY    . CHOLECYSTECTOMY  1990's?  . EYE SURGERY Right   . INSERTION OF DIALYSIS CATHETER Right 10/16/2015   Procedure: INSERTION OF PALINDROME DIALYSIS CATHETER ;  Surgeon: Elam Dutch, MD;  Location: [facility];  Service: Vascular;  Laterality: Right;  . OTHER SURGICAL HISTORY     Retinal surgery  . PARS PLANA VITRECTOMY  02/17/2012   Procedure: PARS PLANA VITRECTOMY WITH 25 GAUGE;  Surgeon: Hayden Pedro, MD;  Location: [facility];  Service: Ophthalmology;  Laterality: Right;  . PERIPHERAL VASCULAR CATHETERIZATION N/A 03/20/2016   Procedure: A/V Shuntogram/Fistulagram;  Surgeon: Conrad [place], MD;  Location: [facility];  Service: Cardiovascular;  Laterality: N/A;    Allergies  Allergen Reactions  . Codeine Rash and Other (See Comments)    Unknown reaction (patient says it was more serious than just a rash, but he can't remember what happened) Unknown reaction (patient says it was more serious than just a rash, but he can't remember what happened)    Review of systems negative except as noted in HPI / PMHx or noted below:  Review of Systems  Constitutional: Negative.   HENT: Negative.   Eyes: Negative.   Respiratory: Negative.   Cardiovascular: Negative.   Gastrointestinal: Negative.   Genitourinary: Negative.   Musculoskeletal: Negative.   Skin: Negative.   Neurological: Negative.   Endo/Heme/Allergies: Negative.     Psychiatric/Behavioral: Negative.      Objective:   Vitals:   04/08/16 1353  BP: 110/78  Pulse: 64  Resp: 16          Physical Exam  Constitutional: He is well-developed, well-nourished, and in no distress.  HENT:  Head: Normocephalic.  Right Ear: Tympanic membrane, external ear and ear canal normal.  Left Ear: Tympanic membrane, external ear and ear canal normal.  Nose: Nose normal. No mucosal edema or rhinorrhea.  Mouth/Throat: Uvula is midline, oropharynx is clear and moist and mucous membranes are normal. No oropharyngeal exudate.  Eyes: Conjunctivae are normal.  Neck: Trachea normal. No tracheal tenderness present. No tracheal deviation present. No thyromegaly  present.  Cardiovascular: Normal rate, regular rhythm, S1 normal, S2 normal and normal heart sounds.   No murmur heard. Pulmonary/Chest: Breath sounds normal. No stridor. No respiratory distress. He has no wheezes. He has no rales.  Musculoskeletal: He exhibits no edema.  Lymphadenopathy:       Head (right side): No tonsillar adenopathy present.       Head (left side): No tonsillar adenopathy present.    He has no cervical adenopathy.  Neurological: He is alert. Gait normal.  Skin: No rash noted. He is not diaphoretic. No erythema. Nails show no clubbing.  Psychiatric: Mood and affect normal.    Diagnostics:    Spirometry was performed and demonstrated an FEV1 of 2.47 at 76 % of predicted.  The patient had an Asthma Control Test with the following results: ACT Total Score: 24.    Assessment and Plan:   1. Sleep apnea   2. Asthma, moderate persistent, well-controlled   3. Allergic rhinoconjunctivitis     1. Continue Breo 100 one inhalation 1 time per day  2. Continue ProAir HFA 2 puffs every 4-6 hours if needed  3. Obtain nocturnal oximetry study on room air  4. Further evaluation?  5. Obtain fall flu vaccine yearly  6. Return to clinic in 1 year or earlier if problem  Isaiah [name]'s asthma is  under excellent control and there is no need to change his medical therapy at this point in time. We do need to work through whether or not he does develop significant hypoxemia at nighttime on room air not using his CPAP machine. If his oximetry is good and he still remains with no sleepy episodes during the daytime then I'm not sure that we need to have him continue on CPAP or obtain a sleep study as required by Medicare especially given the fact that he's never had a sleep study. I will contact him with the results of his sleep study once it is available for review.   Allena Katz, MD [place]

## 2016-04-08 NOTE — Progress Notes (Signed)
Hematology and Oncology Follow Up Visit  [facility] [id number] 06-May-1966 50 y.o. 04/08/2016 9:46 AM    Principle Diagnosis: This is a 50 year old gentleman with the following diagnoses:   1. Monoclonal protein in the form of IgG kappa, likely represents monoclonal gammopathy, undetermined significance versus a smoldering myeloma. He presented with M spike about 1 g/dL and bone marrow biopsy showed 7% plasma cell involvement without evidence of myeloma in 12/2010. He did not have any lytic bone lesions.    Repeat a bone marrow biopsy in 10/16/2014 showed 13% plasma cell infiltration and no lytic bone lesions on his skeletal survey.  2. Multifactorial anemia: The etiology includes anemia of renal disease, plasma cell disorder, myeloproliferative disorder, and GI bleeding.   3. Chronic renal failure hemodialysis-dependent are not related that the plasma cell disorder.  4. He is status post CVA and right-sided weakness and speech difficulties in February 2017.  Interim History:  Isaiah [name] presents today for a followup visit with his son. Since the last visit, he reports continuing improvement in his overall health. He continues to receive outpatient rehabilitation and have had improvement in his strength and mobility. He still has right-sided weakness but able to ambulate without any difficulties. Did not report any falls or syncope. His speech is improving and has not affected his swallowing. His appetite is excellent and have gained weight.  Given his history of recurrent thrombosis episodes, he is currently anticoagulated with Coumadin long-term. He denied any bleeding or thrombosis episodes.  He denied any recent hospitalization or illnesses. He denied any pathological fractures. He denied any recurrent pulmonary infections or any other infections. He denied any peripheral neuropathy.    He does not report any headaches, blurry vision, syncope or seizures. He does not report any   palpitation, orthopnea or leg edema. Does not report any cough or hemoptysis or hematemesis. He does not report any nausea, vomiting. He does not report any frequency, urgency or hesitancy. Rest of his review of systems unremarkable.   Medications: I have reviewed the patient's current medications.  Current Outpatient Prescriptions:  .  aspirin EC 81 MG tablet, Take 1 tablet (81 mg total) by mouth daily., Disp: 100 tablet, Rfl: 0 .  cinacalcet (SENSIPAR) 60 MG tablet, Take by mouth., Disp: , Rfl:  .  enoxaparin (LOVENOX) 100 MG/ML injection, Inject 1 mL (100 mg total) into the skin daily., Disp: 10 Syringe, Rfl: 1 .  epoetin alfa (EPOGEN,PROCRIT) 01655 UNIT/ML injection, Inject 5,000 Units into the skin once a week. Reported on 01/17/2016, Disp: , Rfl:  .  fluticasone furoate-vilanterol (BREO ELLIPTA) 100-25 MCG/INH AEPB, Inhale 1 puff into the lungs daily., Disp: 60 each, Rfl: 1 .  Insulin Glargine (LANTUS) 100 UNIT/ML Solostar Pen, Inject 10 Units into the skin daily at 10 pm. (Patient taking differently: Inject 25 Units into the skin daily at 10 pm. ), Disp: 15 mL, Rfl: 1 .  Lacosamide (VIMPAT) 100 MG TABS, Take 1 tablet (100 mg total) by mouth 2 (two) times daily., Disp: 28 tablet, Rfl: 0 .  lidocaine-prilocaine (EMLA) cream, as needed. , Disp: , Rfl: 10 .  metoprolol tartrate (LOPRESSOR) 25 MG tablet, Take 0.5 tablets (12.5 mg total) by mouth 2 (two) times daily., Disp: 60 tablet, Rfl: 0 .  midodrine (PROAMATINE) 2.5 MG tablet, Take 1 tablet (2.5 mg total) by mouth 2 (two) times daily with a meal., Disp: 60 tablet, Rfl: 1 .  multivitamin (RENA-VIT) TABS tablet, Take 1 tablet by mouth at bedtime.,  Disp: 30 tablet, Rfl: 1 .  NEEDLE, DISP, 27 G (BD DISP NEEDLES) 27G X 1/2" MISC, 1 application by Does not apply route daily after supper., Disp: 100 each, Rfl: 0 .  pantoprazole (PROTONIX) 40 MG tablet, Take 1 tablet (40 mg total) by mouth 2 (two) times daily., Disp: 60 tablet, Rfl: 1 .  UNIFINE  PENTIPS 31G X 8 MM MISC, , Disp: , Rfl: 99 .  warfarin (COUMADIN) 5 MG tablet, Take 1.5 tablets (7.5 mg total) by mouth daily at 6 PM., Disp: 45 tablet, Rfl: 1  Allergies:  Allergies  Allergen Reactions  . Codeine Rash and Other (See Comments)    Unknown reaction (patient says it was more serious than just a rash, but he can't remember what happened) Unknown reaction (patient says it was more serious than just a rash, but he can't remember what happened)    Past Medical History, Surgical history, Social history, and Family History were reviewed and updated.  Physical Exam: Blood pressure 131/67, pulse 61, temperature 97.9 F (36.6 C), temperature source Oral, resp. rate 19, weight 213 lb (96.6 kg), SpO2 99 %. ECOG: 1 General appearance: Alert, awake gentleman without distress. Head: Normocephalic, without obvious abnormality no oral ulcers noted. Neck: no adenopathy Lymph nodes: Cervical, supraclavicular, and axillary nodes normal. Heart:regular rate and rhythm, S1, S2 normal, no murmur, click, rub or gallop Lung:chest clear, no wheezing, rales, normal symmetric air entry. No dullness to percussion. Abdomin: soft, non-tender, without masses or organomegaly. No shifting dullness or ascites. EXT:no erythema, induration, or nodules Right-sided weakness in his upper and lower extremity noted.  Lab Results: Lab Results  Component Value Date   WBC 14.8 (H) 01/11/2016   HGB 11.6 (L) 03/20/2016   HCT 34.0 (L) 03/20/2016   MCV 77.9 (L) 01/11/2016   PLT 477 (H) 01/11/2016     Chemistry      Component Value Date/Time   NA 138 03/20/2016 0633   NA 134 (L) 01/01/2016 1027   K 3.3 (L) 03/20/2016 0633   K 3.0 (LL) 01/01/2016 1027   CL 96 (L) 03/20/2016 0633   CO2 25 01/11/2016 0400   CO2 25 01/01/2016 1027   BUN 30 (H) 03/20/2016 0633   BUN 18.5 01/01/2016 1027   CREATININE 6.30 (H) 03/20/2016 0633   CREATININE 4.7 (HH) 01/01/2016 1027      Component Value Date/Time   CALCIUM 7.1  (L) 01/11/2016 0400   CALCIUM 8.9 01/01/2016 1027   ALKPHOS 113 01/06/2016 1144   ALKPHOS 104 01/01/2016 1027   AST 30 01/06/2016 1144   AST 15 01/01/2016 1027   ALT 14 (L) 01/06/2016 1144   ALT 9 01/01/2016 1027   BILITOT 1.2 01/06/2016 1144   BILITOT 1.71 (H) 01/01/2016 1027       Results for Isaiah [name], Isaiah [name] (MRN [id number]) as of 04/08/2016 09:32  Ref. Range 07/04/2011 11:39 01/16/2012 11:17 09/27/2014 19:26 09/06/2015 13:06 01/01/2016 10:27  IgG (Immunoglobin G), Serum Latest Ref Range: 700 - 1600 mg/dL 2760 (H) 2,630 (H)  2,870 (H) 2,206 (H)   Results for Isaiah [name], Isaiah [name] (MRN [id number]) as of 04/08/2016 09:32  Ref. Range 01/01/2016 10:27  M Protein SerPl Elph-Mcnc Latest Ref Range: Not Observed g/dL 1.4 (H)   Results for Isaiah [name], Isaiah [name] (MRN [id number]) as of 04/08/2016 09:32  Ref. Range 11/29/2010 10:22 07/04/2011 11:39 01/16/2012 11:17 09/27/2014 19:26  M-SPIKE, % Latest Units: g/dL 1.15 1.22 1.16 1.98    Impression and Plan: This is a 50 year old gentleman with  the following issues:   1. Monoclonal gammopathy, IgG subtype representing monoclonal gammopathy of undetermined significance versus multiple myeloma. He had 2 bone marrow biopsies done which showed plasma cell disorder related between 7-13%. His IgG level continued to be relatively stable in the last 3 years around 2600-2800. His M spike still about 1 g/dL.  His protein studies from April 2017 were reviewed again and his IgG level continues to be close to his baseline compared to initial studies in 2012. His M spike is 1.4 g/dL which is considerably less than previous counts.  All these findings support the diagnosis of MGUS versus smoldering multiple myeloma. Symptomatic multiple myeloma is always a consideration and we will continue to monitor him closely for indication to start treatment. I plan on repeating skeletal survey in the next visit and consider repeating a bone marrow biopsy next year.    2. Anemia, which is  multifactorial and definitely has an element of hemoglobinopathy. He will be receiving growth factor support with hemodialysis. His last hemoglobin was above 11 and July 2017.  3. Renal insufficiency: Likely related to long-standing hypertension and diabetes and less likely a plasma cell disorder. He is receiving hemodialysis at this time.  4. Recurrent thrombosis: Most recently developed a CVA likely from a paradoxical embolism. He is chronically anticoagulated with warfarin.  5. Follow-up: Will be in 4 months to follow-up on his clinical status.    [name], MD 8/1/20179:46 AM

## 2016-04-08 NOTE — Therapy (Signed)
[facility] [address] [place], [place], [address] Phone: [phone]   Fax:  [phone]  Occupational Therapy Treatment  Patient Details  Name: Isaiah [name] [facility] MRN: [id number] Date of Birth: 06/14/1966 Referring Provider: Alysia Penna  Encounter Date: 04/08/2016      OT End of Session - 04/08/16 1247    Visit Number 8   Number of Visits 17   Date for OT Re-Evaluation 05/01/16   Authorization Type medicare   Authorization Time Period "$20 co pay, no visit limit, no auth required"   Authorization - Visit Number 8   Authorization - Number of Visits 10   OT Start Time 1147   OT Stop Time 1229   OT Time Calculation (min) 42 min   Activity Tolerance Patient tolerated treatment well      Past Medical History:  Diagnosis Date  . A-fib ([place])   . Acute renal failure ([place]) 07/2011  . Anemia   . Asthma   . Diabetes mellitus without complication ([place])   . ESRD (end stage renal disease) on dialysis ([place])   . Gout   . History of recent blood transfusion 10/27/14   2 Units PRBC's  . Hyperkalemia 07/2011  . Hypertension   . Monoclonal gammopathies   . Monoclonal gammopathy   . OSA on CPAP   . Pulmonary embolism ([place]) 07/2011   a. Tx with Coumadin for 6 months (unknown cause per patient).  . Pulmonary embolism ([place]) 07/2011; 09/27/2014   a. Bilat PE 07/2011 - unclear cause, tx with 6 months Coumadin.;   . Seizures ([place])   . Sickle cell-thalassemia disease ([place])    a. Sickle cell trait.  . Sleep apnea    a. uses CPAP.  [name])     Past Surgical History:  Procedure Laterality Date  . AV FISTULA PLACEMENT Right 12/05/2015   Procedure: INSERTION OF ARTERIOVENOUS (AV) GORE-TEX GRAFT ARM;  Surgeon: Elam Dutch, MD;  Location: [facility];  Service: Vascular;  Laterality: Right;  . BONE MARROW BIOPSY    . CHOLECYSTECTOMY    . CHOLECYSTECTOMY  1990's?  . EYE SURGERY Right   . INSERTION OF DIALYSIS CATHETER  Right 10/16/2015   Procedure: INSERTION OF PALINDROME DIALYSIS CATHETER ;  Surgeon: Elam Dutch, MD;  Location: [facility];  Service: Vascular;  Laterality: Right;  . OTHER SURGICAL HISTORY     Retinal surgery  . PARS PLANA VITRECTOMY  02/17/2012   Procedure: PARS PLANA VITRECTOMY WITH 25 GAUGE;  Surgeon: Hayden Pedro, MD;  Location: [facility];  Service: Ophthalmology;  Laterality: Right;  . PERIPHERAL VASCULAR CATHETERIZATION N/A 03/20/2016   Procedure: A/V Shuntogram/Fistulagram;  Surgeon: Conrad [place]ville, MD;  Location: [facility];  Service: Cardiovascular;  Laterality: N/A;    There were no vitals filed for this visit.      Subjective Assessment - 04/08/16 1152    Subjective  I didn't get a chance to practice shoe tying because I was so tired from dialysis   Pertinent History see Epic    Patient Stated Goals to use right hand better   Currently in Pain? No/denies                      OT Treatments/Exercises (OP) - 04/08/16 0001      ADLs   Cooking Addressed simple hot meal prep (scrambled eggs) as well as clean up with emphasis on organization, safety, using RUE as much  as possible and balance.  Pt required  mod cues to use R hand but could use as non dominant to dominant depending on individual task.  Pt with inattention to RUE.       Fine Motor Coordination   In Hand Manipulation Training Addressed fine motor in hand manipulation. PT with improvement in ability however needs max cues to use vision and to truly use hand ot full potential.             Balance Exercises - 04/07/16 1008      Balance Exercises: Standing   Stepping Strategy Anterior;Posterior;Lateral;Foam/compliant surface;UE support  intermittent UE support   Tandem Gait Forward;Upper extremity support;Foam/compliant surface   Sidestepping Foam/compliant support;Upper extremity support             OT Short Term Goals - 04/08/16 1242      OT SHORT TERM GOAL #1   Title I with  initiall HEP- 03/20/2016   Time 4   Period Weeks   Status Achieved     OT SHORT TERM GOAL #2   Title Pt will perform all basic ADLS with supervision.   Time 4   Period Weeks   Status Achieved     OT SHORT TERM GOAL #3   Title Patient will demonstrate improved functional strength in right UE as evidenced by brushing teeth with dominant right hand   Time 4   Period Weeks   Status Achieved     OT SHORT TERM GOAL #4   Title Pt will demonstrate ability to retrieve a light weight (less than 1 lb) object at 120 shoulder flexion with RUE.   Time 4   Period Weeks   Status Achieved  3 pound object     OT SHORT TERM GOAL #5   Title Patient will demostrate improved coordination for right hand as evidenced by 15 on box and blocks test   Time 4   Period Weeks   Status Achieved  25 on 03/12/2016     OT SHORT TERM GOAL #6   Title Pt will report feeding himself with RUE 75% of the time.   Time 4   Period Weeks   Status Achieved           OT Long Term Goals - 04/08/16 1242      OT LONG TERM GOAL #1   Title I with updated HEP.- 04/17/2016   Time 8   Period Weeks   Status On-going     OT LONG TERM GOAL #2   Title Pt will demonstrate ability to write his name legibily.   Time 8   Period Weeks   Status On-going     OT LONG TERM GOAL #3   Title Patient will demonstrate adequate strength and coordination in dominant right hand to tighten laces and tie shoes using bilateral technique   Time 8   Period Weeks   Status Achieved     OT LONG TERM GOAL #4   Title Assess 9 hole peg test and determine goal   Status Achieved  pt unable to do 1 in 1 minute time      OT LONG TERM GOAL #5   Title Pt will demonstrate RUE grip strength of 20+ lbs for increased functional use.   Time 8   Period Weeks   Status On-going  12 pounds on 03/18/2016 using 3rd notch     OT LONG TERM GOAL #6   Title Pt will perform all basic ADLs and simple  meal prep modified independently.   Time 8   Period  Weeks   Status Achieved               Plan - 04/08/16 1245    Clinical Impression Statement Pt is progressing toward goals. Pt requires cues to use RUE as much as possible   Rehab Potential Good   Clinical Impairments Affecting Rehab Potential aphasia, L inattention, cognitive impairmen   OT Frequency 2x / week   OT Duration 8 weeks   OT Treatment/Interventions Self-care/ADL training;Therapeutic exercise;Patient/family education;Balance training;Splinting;Manual Therapy;Neuromuscular education;Ultrasound;Therapeutic exercises;Therapeutic activities;DME and/or AE instruction;Parrafin;Cryotherapy;Electrical Stimulation;Fluidtherapy;Scar mobilization;Cognitive remediation/compensation;Moist Heat;Contrast Bath;Passive range of motion;Visual/perceptual remediation/compensation;Functional Mobility Training   Plan NMR for RUE, continue coodination, writing, using RUE in functional tasks   Consulted and Agree with Plan of Care Patient      Patient will benefit from skilled therapeutic intervention in order to improve the following deficits and impairments:  Cardiopulmonary status limiting activity, Decreased activity tolerance, Decreased balance, Decreased cognition, Decreased endurance, Decreased coordination, Decreased strength, Increased edema, Impaired tone, Impaired UE functional use, Impaired vision/preception, Increased fascial restricitons  Visit Diagnosis: Apraxia  Other lack of coordination  Unsteadiness on feet  Hemiplegia and hemiparesis following cerebral infarction affecting right dominant side (HCC)  Muscle weakness (generalized)  Cognitive social or emotional deficit following cerebral infarction    Problem List Patient Active Problem List   Diagnosis Date Noted  . Hemiparesis affecting right side as late effect of cerebrovascular accident ([place]) 02/28/2016  . Aphasia, late effect of cerebrovascular disease 02/28/2016  . Acute encephalopathy 01/08/2016  .  Seizures ([place]) 01/08/2016  . History of ischemic left MCA stroke 01/08/2016  . Right sided weakness 01/08/2016  . Chronic combined systolic and diastolic CHF (congestive heart failure) ([place]) 01/08/2016  . Atrial fibrillation ([place]) 01/08/2016  . Leukocytosis 01/08/2016  . Benign essential HTN 01/08/2016  . Anemia of chronic disease 01/08/2016  . Controlled type 2 diabetes mellitus with diabetic nephropathy, without long-term current use of insulin ([place]) 01/08/2016  . History of DVT (deep vein thrombosis) 01/08/2016  . ESRD (end stage renal disease) on dialysis [facility])     Quay Burow, OTR/L 04/08/2016, 12:49 PM  [facility] [address] [place], [place], [address] Phone: [phone]   Fax:  [phone]  Name: Ziquan Fidel [facility] MRN: [id number] Date of Birth: 1966-06-02

## 2016-04-08 NOTE — Therapy (Signed)
[facility] [address] [place], [place], [address] Phone: [phone]   Fax:  [phone]  Speech Language Pathology Treatment  Patient Details  Name: Isaiah [name] [facility] MRN: [id number] Date of Birth: Mar 29, 1966 Referring Provider: Alysia Penna, MD  Encounter Date: 04/08/2016      End of Session - 04/08/16 1448    Visit Number 12   Number of Visits 20   Date for SLP Re-Evaluation 05/02/16   SLP Start Time 1107   SLP Stop Time  1147   SLP Time Calculation (min) 40 min   Activity Tolerance Patient tolerated treatment well      Past Medical History:  Diagnosis Date  . A-fib ([place])   . Acute renal failure ([place]) 07/2011  . Anemia   . Asthma   . Diabetes mellitus without complication ([place])   . ESRD (end stage renal disease) on dialysis ([place])   . Gout   . History of recent blood transfusion 10/27/14   2 Units PRBC's  . Hyperkalemia 07/2011  . Hypertension   . Monoclonal gammopathies   . Monoclonal gammopathy   . OSA on CPAP   . Pulmonary embolism ([place]) 07/2011   a. Tx with Coumadin for 6 months (unknown cause per patient).  . Pulmonary embolism ([place]) 07/2011; 09/27/2014   a. Bilat PE 07/2011 - unclear cause, tx with 6 months Coumadin.;   . Seizures ([place])   . Sickle cell-thalassemia disease ([place])    a. Sickle cell trait.  . Sleep apnea    a. uses CPAP.  [name] Stroke [facility])     Past Surgical History:  Procedure Laterality Date  . AV FISTULA PLACEMENT Right 12/05/2015   Procedure: INSERTION OF ARTERIOVENOUS (AV) GORE-TEX GRAFT ARM;  Surgeon: Elam Dutch, MD;  Location: [facility];  Service: Vascular;  Laterality: Right;  . BONE MARROW BIOPSY    . CHOLECYSTECTOMY    . CHOLECYSTECTOMY  1990's?  . EYE SURGERY Right   . INSERTION OF DIALYSIS CATHETER Right 10/16/2015   Procedure: INSERTION OF PALINDROME DIALYSIS CATHETER ;  Surgeon: Elam Dutch, MD;  Location: [place];  Service: Vascular;  Laterality: Right;  .  OTHER SURGICAL HISTORY     Retinal surgery  . PARS PLANA VITRECTOMY  02/17/2012   Procedure: PARS PLANA VITRECTOMY WITH 25 GAUGE;  Surgeon: Hayden Pedro, MD;  Location: [place];  Service: Ophthalmology;  Laterality: Right;  . PERIPHERAL VASCULAR CATHETERIZATION N/A 03/20/2016   Procedure: A/V Shuntogram/Fistulagram;  Surgeon: Conrad [place], MD;  Location: [place] CV [facility];  Service: Cardiovascular;  Laterality: N/A;    There were no vitals filed for this visit.      Subjective Assessment - 04/08/16 1113    Subjective Pt reports to SLP he has two therapies today.   Currently in Pain? No/denies               ADULT SLP TREATMENT - 04/08/16 1158      General Information   Behavior/Cognition Alert;Cooperative;Pleasant mood     Treatment Provided   Treatment provided Cognitive-Linquistic     Cognitive-Linquistic Treatment   Treatment focused on Cognition   Skilled Treatment SLP facilitated pt's organizational skills with picture sequencing task. Pt req'd extra time with 50% of sequences. He demo'd decr'd attention to detail in more detailed tasks, and req'd mod-max SLP A for problem solving/reasoning how to ensure his sequence was correct.  SLP and pt discussed compensations to consider with more detailed tasks.  Assessment / Recommendations / Plan   Plan Continue with current plan of care     Progression Toward Goals   Progression toward goals Progressing toward goals          SLP Education - 04/08/16 1447    Education provided Yes   Education Details compensations for decr'd organizational skills  ("time out" to pre-plan)   Person(s) Educated Patient   Methods Explanation;Demonstration   Comprehension Verbalized understanding          SLP Short Term Goals - 04/07/16 0855      SLP SHORT TERM GOAL #1   Title pt will demo HEP for apraxia/motor plannning with independence over 3 sessions   Baseline began new POC 02-21-16   Time 1   Period --   visits, for  all STGs   Status Not Met     SLP SHORT TERM GOAL #2   Title pt will demo HEP for dysarthria with independence over 3 sessions   Baseline 03/27/16, 04/03/16, 04-07-16   Time 1   Period --  visits   Status Achieved     SLP SHORT TERM GOAL #3   Title pt will demo speech 95% accurate in sentence-length responses with modified independence (compensations) over 3 sessions   Baseline --   Time --   Period --   Status Partially Met  one of three sessions     SLP SHORT TERM GOAL #4   Title pt will provide 4 memory strategies to SLP   Time --   Period --   Status Deferred  due to work with intelligibility and simple problem solviing     SLP SHORT TERM GOAL #5   Title pt will demo simple math, time, money tasks with 80% success   Time --   Period --   Status Not Met          SLP Long Term Goals - 04/08/16 1450      SLP LONG TERM GOAL #1   Title pt will demo functional speech in 20 minutes mod complex/complex conversation with modified independnence   Baseline new POC on 02-21-16   Time 8   Period --  visits, for all LTGs   Status On-going     SLP LONG TERM GOAL #2   Title pt will demo HEP for speech-motor planning indpendently over 3 total sessions    Time 8   Period --  visits   Status Revised     SLP LONG TERM GOAL #3   Title pt will demo HEP for dysarthria independently over 5 total sessions   Baseline 04-07-16   Time 8   Period --  visits   Status On-going     SLP LONG TERM GOAL #4   Title pt will demo functional organizational/sequencing ability for simple cognitive communication tasks   Time 8   Period --  visits   Status On-going     SLP LONG TERM GOAL #5   Title pt will demo evidence of memory system initiated   Time 9   Period --  visits   Status Revised          Plan - 04/08/16 1448    Clinical Impression Statement Pt's speech appeared improved over last week - commensurate with status two weeks ago. Pt continues to exhibit severely reduced  simple cognitive skills including reasoning/problem solving and organization, as well as decr'd attention to detail. Pt agrees with SLP he is not ready to return to  work at this time. Continue skilled ST to maximize speech intelligibilty, and cognition for independence and possible return to work in some capacity.   Speech Therapy Frequency 2x / week   Duration --  8 more visits   Treatment/Interventions SLP instruction and feedback;Compensatory strategies;Internal/external aids;Oral motor exercises;Patient/family education;Functional tasks;Cueing hierarchy;Cognitive reorganization   Potential to Achieve Goals Fair   Potential Considerations Severity of impairments      Patient will benefit from skilled therapeutic intervention in order to improve the following deficits and impairments:   Apraxia  Cognitive communication deficit  Dysarthria and anarthria    Problem List Patient Active Problem List   Diagnosis Date Noted  . Hemiparesis affecting right side as late effect of cerebrovascular accident ([place]) 02/28/2016  . Aphasia, late effect of cerebrovascular disease 02/28/2016  . Acute encephalopathy 01/08/2016  . Seizures ([place]) 01/08/2016  . History of ischemic left MCA stroke 01/08/2016  . Right sided weakness 01/08/2016  . Chronic combined systolic and diastolic CHF (congestive heart failure) ([place]) 01/08/2016  . Atrial fibrillation ([place]) 01/08/2016  . Leukocytosis 01/08/2016  . Benign essential HTN 01/08/2016  . Anemia of chronic disease 01/08/2016  . Controlled type 2 diabetes mellitus with diabetic nephropathy, without long-term current use of insulin ([place]) 01/08/2016  . History of DVT (deep vein thrombosis) 01/08/2016  . ESRD (end stage renal disease) on dialysis [facility])     [place] ,[place], [place]  04/08/2016, 2:51 PM  [facility] [address] [place], [place], [address] Phone: [phone]   Fax:   [phone]   Name: Isaiah [name] [facility] MRN: [id number] Date of Birth: 07/27/1966

## 2016-04-08 NOTE — Patient Instructions (Addendum)
  1. Continue Breo 100 one inhalation 1 time per day  2. Continue ProAir HFA 2 puffs every 4-6 hours if needed  3. Obtain nocturnal oximetry study on room air  4. Further evaluation?  5. Obtain fall flu vaccine yearly  6. Return to clinic in 1 year or earlier if problem

## 2016-04-09 DIAGNOSIS — D509 Iron deficiency anemia, unspecified: Secondary | ICD-10-CM | POA: Diagnosis not present

## 2016-04-09 DIAGNOSIS — N186 End stage renal disease: Secondary | ICD-10-CM | POA: Diagnosis not present

## 2016-04-09 DIAGNOSIS — T8249XA Other complication of vascular dialysis catheter, initial encounter: Secondary | ICD-10-CM | POA: Diagnosis not present

## 2016-04-09 DIAGNOSIS — D688 Other specified coagulation defects: Secondary | ICD-10-CM | POA: Diagnosis not present

## 2016-04-09 DIAGNOSIS — E1129 Type 2 diabetes mellitus with other diabetic kidney complication: Secondary | ICD-10-CM | POA: Diagnosis not present

## 2016-04-09 LAB — KAPPA/LAMBDA LIGHT CHAINS
Ig Kappa Free Light Chain: 657.8 mg/L — ABNORMAL HIGH (ref 3.3–19.4)
Ig Lambda Free Light Chain: 128.4 mg/L — ABNORMAL HIGH (ref 5.7–26.3)
Kappa/Lambda FluidC Ratio: 5.12 — ABNORMAL HIGH (ref 0.26–1.65)

## 2016-04-10 ENCOUNTER — Encounter: Payer: Self-pay | Admitting: Vascular Surgery

## 2016-04-10 ENCOUNTER — Ambulatory Visit (HOSPITAL_COMMUNITY)
Admission: RE | Admit: 2016-04-10 | Discharge: 2016-04-10 | Disposition: A | Discharge status: Home / Self Care | Payer: 59 | Source: Ambulatory Visit | Attending: Vascular Surgery | Admitting: Vascular Surgery

## 2016-04-10 ENCOUNTER — Encounter: Payer: Self-pay | Admitting: Occupational Therapy

## 2016-04-10 ENCOUNTER — Ambulatory Visit (INDEPENDENT_AMBULATORY_CARE_PROVIDER_SITE_OTHER): Payer: 59 | Admitting: Vascular Surgery

## 2016-04-10 VITALS — BP 104/78 | HR 67 | Temp 98.3°F | Resp 20 | Ht 70.0 in | Wt 215.0 lb

## 2016-04-10 DIAGNOSIS — Z4931 Encounter for adequacy testing for hemodialysis: Secondary | ICD-10-CM | POA: Diagnosis not present

## 2016-04-10 DIAGNOSIS — T82590D Other mechanical complication of surgically created arteriovenous fistula, subsequent encounter: Secondary | ICD-10-CM | POA: Diagnosis not present

## 2016-04-10 DIAGNOSIS — Z7901 Long term (current) use of anticoagulants: Secondary | ICD-10-CM | POA: Diagnosis not present

## 2016-04-10 DIAGNOSIS — E1165 Type 2 diabetes mellitus with hyperglycemia: Secondary | ICD-10-CM | POA: Diagnosis not present

## 2016-04-10 DIAGNOSIS — I639 Cerebral infarction, unspecified: Secondary | ICD-10-CM

## 2016-04-10 DIAGNOSIS — I6789 Other cerebrovascular disease: Secondary | ICD-10-CM | POA: Diagnosis not present

## 2016-04-10 DIAGNOSIS — G40309 Generalized idiopathic epilepsy and epileptic syndromes, not intractable, without status epilepticus: Secondary | ICD-10-CM | POA: Diagnosis not present

## 2016-04-10 DIAGNOSIS — N186 End stage renal disease: Secondary | ICD-10-CM | POA: Diagnosis not present

## 2016-04-10 LAB — MULTIPLE MYELOMA PANEL, SERUM
Albumin SerPl Elph-Mcnc: 4.1 g/dL (ref 2.9–4.4)
Albumin/Glob SerPl: 0.8 (ref 0.7–1.7)
Alpha 1: 0.3 g/dL (ref 0.0–0.4)
Alpha2 Glob SerPl Elph-Mcnc: 0.6 g/dL (ref 0.4–1.0)
B-Globulin SerPl Elph-Mcnc: 1 g/dL (ref 0.7–1.3)
Gamma Glob SerPl Elph-Mcnc: 3.5 g/dL — ABNORMAL HIGH (ref 0.4–1.8)
Globulin, Total: 5.4 g/dL — ABNORMAL HIGH (ref 2.2–3.9)
IgA, Qn, Serum: 104 mg/dL (ref 90–386)
IgG, Qn, Serum: 3556 mg/dL — ABNORMAL HIGH (ref 700–1600)
IgM, Qn, Serum: 37 mg/dL (ref 20–172)
M Protein SerPl Elph-Mcnc: 2.5 g/dL — ABNORMAL HIGH
Total Protein: 9.5 g/dL — ABNORMAL HIGH (ref 6.0–8.5)

## 2016-04-10 MED FILL — MIDODRINE HCL 2.5 MG TABLET: 2.5 | 90 days supply | Qty: 180 | Fill #1

## 2016-04-10 MED FILL — PANTOPRAZOLE SOD DR 40 MG T: 40 | 30 days supply | Qty: 60 | Fill #1

## 2016-04-10 NOTE — Progress Notes (Signed)
Patient returns for follow-up today after recent band procedure by Dr. Bridgett Larsson. Patient is currently dialyzing via a right-sided catheter. He denies any steal symptoms.  Physical exam:  Vitals:   04/10/16 1507  BP: 104/78  Pulse: 67  Resp: 20  Temp: 98.3 F (36.8 C)  TempSrc: Oral  SpO2: 98%  Weight: 215 lb (97.5 kg)  Height: 5' 10"  (1.778 m)    Right upper extremity: Well-healed wrist incision well-healed percutaneous puncture site fistula is easily palpable over the course of the forearm. There is an easily palpable thrill.  Assessment: Mature fistula right radiocephalic  Plan: Start to cannulate fistula if not successful we will need to talk about a new access it did not believe any other interventions without the fistula at this point. Patient will follow-up on as-needed basis.  Ruta Hinds, MD [facility] Office: [phone] Pager: [phone]

## 2016-04-11 ENCOUNTER — Ambulatory Visit: Payer: 59

## 2016-04-11 ENCOUNTER — Ambulatory Visit: Payer: 59 | Admitting: Physical Therapy

## 2016-04-11 DIAGNOSIS — I69315 Cognitive social or emotional deficit following cerebral infarction: Secondary | ICD-10-CM | POA: Diagnosis not present

## 2016-04-11 DIAGNOSIS — R278 Other lack of coordination: Secondary | ICD-10-CM | POA: Diagnosis not present

## 2016-04-11 DIAGNOSIS — I69351 Hemiplegia and hemiparesis following cerebral infarction affecting right dominant side: Secondary | ICD-10-CM | POA: Diagnosis not present

## 2016-04-11 DIAGNOSIS — R471 Dysarthria and anarthria: Secondary | ICD-10-CM

## 2016-04-11 DIAGNOSIS — R482 Apraxia: Secondary | ICD-10-CM | POA: Diagnosis not present

## 2016-04-11 DIAGNOSIS — R2681 Unsteadiness on feet: Secondary | ICD-10-CM | POA: Diagnosis not present

## 2016-04-11 DIAGNOSIS — R2689 Other abnormalities of gait and mobility: Secondary | ICD-10-CM

## 2016-04-11 DIAGNOSIS — N186 End stage renal disease: Secondary | ICD-10-CM | POA: Diagnosis not present

## 2016-04-11 DIAGNOSIS — R279 Unspecified lack of coordination: Secondary | ICD-10-CM | POA: Diagnosis not present

## 2016-04-11 DIAGNOSIS — E1129 Type 2 diabetes mellitus with other diabetic kidney complication: Secondary | ICD-10-CM | POA: Diagnosis not present

## 2016-04-11 DIAGNOSIS — T8249XA Other complication of vascular dialysis catheter, initial encounter: Secondary | ICD-10-CM | POA: Diagnosis not present

## 2016-04-11 DIAGNOSIS — M6281 Muscle weakness (generalized): Secondary | ICD-10-CM

## 2016-04-11 DIAGNOSIS — D688 Other specified coagulation defects: Secondary | ICD-10-CM | POA: Diagnosis not present

## 2016-04-11 DIAGNOSIS — R41841 Cognitive communication deficit: Secondary | ICD-10-CM

## 2016-04-11 DIAGNOSIS — D509 Iron deficiency anemia, unspecified: Secondary | ICD-10-CM | POA: Diagnosis not present

## 2016-04-11 NOTE — Patient Instructions (Signed)
  Please complete the assigned speech therapy homework and return it to your next session.

## 2016-04-11 NOTE — Therapy (Signed)
[facility] [address] [place], [place], [address] Phone: [phone]   Fax:  [phone]  Physical Therapy Treatment  Patient Details  Name: Isaiah [name] [facility] MRN: [id number] Date of Birth: 07/28/1966 Referring Provider: Dr. Letta Pate  Encounter Date: 04/11/2016      PT End of Session - 04/11/16 2120    Visit Number 12   Number of Visits 17   Date for PT Re-Evaluation 04/22/16   Authorization Type BCBS   PT Start Time [phone]  pt arrives late   PT Stop Time 0933   PT Time Calculation (min) 36 min   Activity Tolerance Patient tolerated treatment well   Behavior During Therapy [facility] for tasks assessed/performed      Past Medical History:  Diagnosis Date  . A-fib ([place])   . Acute renal failure ([place]) 07/2011  . Anemia   . Asthma   . Diabetes mellitus without complication ([place])   . ESRD (end stage renal disease) on dialysis ([place])   . Gout   . History of recent blood transfusion 10/27/14   2 Units PRBC's  . Hyperkalemia 07/2011  . Hypertension   . Monoclonal gammopathies   . Monoclonal gammopathy   . OSA on CPAP   . Pulmonary embolism ([place]) 07/2011   a. Tx with Coumadin for 6 months (unknown cause per patient).  . Pulmonary embolism ([place]) 07/2011; 09/27/2014   a. Bilat PE 07/2011 - unclear cause, tx with 6 months Coumadin.;   . Seizures ([place])   . Sickle cell-thalassemia disease ([place])    a. Sickle cell trait.  . Sleep apnea    a. uses CPAP.  [name] Stroke [facility])     Past Surgical History:  Procedure Laterality Date  . AV FISTULA PLACEMENT Right 12/05/2015   Procedure: INSERTION OF ARTERIOVENOUS (AV) GORE-TEX GRAFT ARM;  Surgeon: Elam Dutch, MD;  Location: [facility];  Service: Vascular;  Laterality: Right;  . BONE MARROW BIOPSY    . CHOLECYSTECTOMY    . CHOLECYSTECTOMY  1990's?  . EYE SURGERY Right   . INSERTION OF DIALYSIS CATHETER Right 10/16/2015   Procedure: INSERTION OF PALINDROME DIALYSIS CATHETER ;  Surgeon:  Elam Dutch, MD;  Location: [facility];  Service: Vascular;  Laterality: Right;  . OTHER SURGICAL HISTORY     Retinal surgery  . PARS PLANA VITRECTOMY  02/17/2012   Procedure: PARS PLANA VITRECTOMY WITH 25 GAUGE;  Surgeon: Hayden Pedro, MD;  Location: [facility];  Service: Ophthalmology;  Laterality: Right;  . PERIPHERAL VASCULAR CATHETERIZATION N/A 03/20/2016   Procedure: A/V Shuntogram/Fistulagram;  Surgeon: Conrad [place], MD;  Location: [facility] CV [facility];  Service: Cardiovascular;  Laterality: N/A;    There were no vitals filed for this visit.      Subjective Assessment - 04/11/16 0901    Subjective Pt requests to not use treadmill anymore due to cramping at dialysis after using the treadmill.  No stumbles, or falls reported.  Pt also requests not to get the community center fitness information due to financial concerns.   Pertinent History Seizure 12/2015; DVT, PE-has IVC filter in placed, DM, CKD-stage V on dialysis, sickle cell anemia, OSA, asthma, HTN, tachycardia in hospital (12/06/15), AVF    Patient Stated Goals Pt's goal for therapy is to get back to full range of motion and return to work.   Currently in Pain? No/denies  [facility] Adult PT Treatment/Exercise - 04/11/16 0903      Transfers   Transfers Sit to Stand;Stand to Sit   Sit to Stand 6: Modified independent (Device/Increase time);With upper extremity assist;From chair/3-in-1   Stand to Sit 6: Modified independent (Device/Increase time);With upper extremity assist;To chair/3-in-1   Number of Reps 10 reps     Ambulation/Gait   Ambulation/Gait Yes   Ambulation/Gait Assistance 7: Independent   Ambulation Distance (Feet) 600 Feet   Assistive device None   Gait Pattern Step-through pattern;Decreased dorsiflexion - right;Poor foot clearance - right;Trunk flexed   Ambulation Surface Unlevel;Outdoor;Indoor;Level   Stairs Yes   Stairs Assistance 6: Modified independent (Device/Increase time)    Stair Management Technique One rail Left;Alternating pattern   Number of Stairs 4  x 3 reps   Height of Stairs 6     Exercises   Exercises Knee/Hip   Other Exercises  Wall squats for quad/hamstring strengthening 2 x 10 reps; Heel/toe raises x 10 reps     Knee/Hip Exercises: Standing   Other Standing Knee Exercises Resisted hip abduction, adduction, extension, flexion with blue theraband 10 reps each.  Cues for technique and for upright posture.  Informally added to HEP, as pt says he will try this at home with blue theraband he has.  Will likely review next visit and provide with longer band if needed.                  PT Short Term Goals - 04/03/16 1440      PT SHORT TERM GOAL #1   Title Pt will perform HEP independenlty for improved strength, balance and gait.  TARGET 03/23/16   Baseline Per progress note on 03/27/16   Time 4   Period Weeks   Status Partially Met     PT SHORT TERM GOAL #2   Title Pt will improve Berg Score to at least 49/56 for decreased fall risk.   Baseline per progress note. 49/56.  03/27/16   Time 4   Period Weeks   Status Achieved     PT SHORT TERM GOAL #3   Title Pt will improve TUG score to less than or equal to 13.5 seconds for decreased fall risk.   Baseline Per progress note, 8.84 seconds no AD. 03/27/16   Time 4   Period Weeks   Status Achieved     PT SHORT TERM GOAL #4   Title Pt will amb. 300' over even/uneven terrain with LRAD at MOD I level.   Baseline per progress not 03/27/16   Status Achieved           PT Long Term Goals - 02/22/16 1245      PT LONG TERM GOAL #1   Title Pt will verbalize understanding of fall prevention in the home environment.  TARGET 04/22/16   Time 8   Period Weeks   Status New     PT LONG TERM GOAL #2   Title Pt will improve Dynamic Gait Index score to at least 19/24 for decreased fall risk.   Time 8   Period Weeks   Status New     PT LONG TERM GOAL #3   Title Pt will improve single limb  stance to at least 3 seconds each leg for improved negotiation of steps and obstacles.   Time 8   Period Weeks   Status New     PT LONG TERM GOAL #4   Title Pt will amb. 600' over even/uneven terrain IND  without AD to improve functional moblity.    Time 8   Period Weeks   Status New     PT LONG TERM GOAL #5   Title Pt will ascend/descent 12 steps with one handrail at MOD I level to negotiate steps at home.   Time 8   Period Weeks   Status New               Plan - 04/11/16 2122    Clinical Impression Statement Pt requires mutliple cues for sequencing and posture with blue resisted band hip strengthening.  Pt continues to feel that his foot clearance is improved, but still noted to have decreased foot clearance with gait.  Pt will benefit from continued skilled PT to address strength, balance, coordination of gait.   Rehab Potential Good   Clinical Impairments Affecting Rehab Potential co-morbidities   PT Frequency 2x / week   PT Duration 8 weeks   PT Treatment/Interventions ADLs/Self Care Home Management;Therapeutic exercise;Therapeutic activities;Functional mobility training;Gait training;Balance training;Neuromuscular re-education;Patient/family education   PT Next Visit Plan Add written HEP for resisted hip kicks; review full HEP; provide pt with exercise chart for prioritizing exercises.  Plan for discharge in 2 weeks at end of POC.   Consulted and Agree with Plan of Care Patient      Patient will benefit from skilled therapeutic intervention in order to improve the following deficits and impairments:  Abnormal gait, Decreased balance, Decreased mobility, Decreased strength, Difficulty walking, Postural dysfunction  Visit Diagnosis: Muscle weakness (generalized)  Other abnormalities of gait and mobility     Problem List Patient Active Problem List   Diagnosis Date Noted  . Hemiparesis affecting right side as late effect of cerebrovascular accident ([place])  02/28/2016  . Aphasia, late effect of cerebrovascular disease 02/28/2016  . Acute encephalopathy 01/08/2016  . Seizures ([place]) 01/08/2016  . History of ischemic left MCA stroke 01/08/2016  . Right sided weakness 01/08/2016  . Chronic combined systolic and diastolic CHF (congestive heart failure) ([place]) 01/08/2016  . Atrial fibrillation ([place]) 01/08/2016  . Leukocytosis 01/08/2016  . Benign essential HTN 01/08/2016  . Anemia of chronic disease 01/08/2016  . Controlled type 2 diabetes mellitus with diabetic nephropathy, without long-term current use of insulin ([place]) 01/08/2016  . History of DVT (deep vein thrombosis) 01/08/2016  . ESRD (end stage renal disease) on dialysis [name])     MARRI[place],AMY W. 04/11/2016, 9:29 PM  Frazier Butt., PT  [facility] [address] [place], [place], [address] Phone: [phone]   Fax:  [phone]  Name: Isaiah [name] [facility] MRN: [id number] Date of Birth: 1966-05-31

## 2016-04-11 NOTE — Therapy (Signed)
[facility] [address] [place], [place], [address] Phone: [phone]   Fax:  [phone]  Speech Language Pathology Treatment  Patient Details  Name: Isaiah [name] [facility] MRN: [id number] Date of Birth: [birth year] Referring Provider: Alysia Penna, MD  Encounter Date: 04/11/2016      End of Session - 04/11/16 1258    Visit Number 13   Number of Visits 20   Date for SLP Re-Evaluation 05/02/16   SLP Start Time 0935   SLP Stop Time  1016   SLP Time Calculation (min) 41 min   Activity Tolerance Patient tolerated treatment well      Past Medical History:  Diagnosis Date  . A-fib ([place])   . Acute renal failure ([place]) 07/2011  . Anemia   . Asthma   . Diabetes mellitus without complication ([place])   . ESRD (end stage renal disease) on dialysis ([place])   . Gout   . History of recent blood transfusion 10/27/14   2 Units PRBC's  . Hyperkalemia 07/2011  . Hypertension   . Monoclonal gammopathies   . Monoclonal gammopathy   . OSA on CPAP   . Pulmonary embolism ([place]) 07/2011   a. Tx with Coumadin for 6 months (unknown cause per patient).  . Pulmonary embolism ([place]) 07/2011; 09/27/2014   a. Bilat PE 07/2011 - unclear cause, tx with 6 months Coumadin.;   . Seizures ([place])   . Sickle cell-thalassemia disease ([place])    a. Sickle cell trait.  . Sleep apnea    a. uses CPAP.  [name] Stroke [facility])     Past Surgical History:  Procedure Laterality Date  . AV FISTULA PLACEMENT Right 12/05/2015   Procedure: INSERTION OF ARTERIOVENOUS (AV) GORE-TEX GRAFT ARM;  Surgeon: Elam Dutch, MD;  Location: [facility];  Service: Vascular;  Laterality: Right;  . BONE MARROW BIOPSY    . CHOLECYSTECTOMY    . CHOLECYSTECTOMY  1990's?  . EYE SURGERY Right   . INSERTION OF DIALYSIS CATHETER Right 10/16/2015   Procedure: INSERTION OF PALINDROME DIALYSIS CATHETER ;  Surgeon: Elam Dutch, MD;  Location: [facility];  Service: Vascular;  Laterality: Right;  .  OTHER SURGICAL HISTORY     Retinal surgery  . PARS PLANA VITRECTOMY  02/17/2012   Procedure: PARS PLANA VITRECTOMY WITH 25 GAUGE;  Surgeon: Hayden Pedro, MD;  Location: [facility];  Service: Ophthalmology;  Laterality: Right;  . PERIPHERAL VASCULAR CATHETERIZATION N/A 03/20/2016   Procedure: A/V Shuntogram/Fistulagram;  Surgeon: Conrad Plainfield, MD;  Location: [facility];  Service: Cardiovascular;  Laterality: N/A;    There were no vitals filed for this visit.      Subjective Assessment - 04/11/16 0953    Subjective "Amy wore me out." (with WNL dialect)   Currently in Pain? No/denies               ADULT SLP TREATMENT - 04/11/16 0955      General Information   Behavior/Cognition Alert;Cooperative;Pleasant mood     Treatment Provided   Treatment provided Cognitive-Linquistic     Cognitive-Linquistic Treatment   Treatment focused on Cognition   Skilled Treatment (speech tx): SLP facilitated pt's incr'd intelligibility by cueing him PRN in short (10 minute) min-mod complex conversation. Pt with 90% WNL dialect, without apraxia noted.  In HEP for apraxia, pt req'd min-mod A occasionally with modeling cues for proper/WFL articulation.  (cog skills 12 minutes:) WIth a simple deductive reasoning task, pt req'd max A  consistently/total A.      Assessment / Recommendations / Plan   Plan Continue with current plan of care     Progression Toward Goals   Progression toward goals Progressing toward goals            SLP Short Term Goals - 04/07/16 0855      SLP SHORT TERM GOAL #1   Title pt will demo HEP for apraxia/motor plannning with independence over 3 sessions   Baseline began new POC 02-21-16   Time 1   Period --   visits, for all STGs   Status Not Met     SLP SHORT TERM GOAL #2   Title pt will demo HEP for dysarthria with independence over 3 sessions   Baseline 03/27/16, 04/03/16, 04-07-16   Time 1   Period --  visits   Status Achieved     SLP SHORT TERM GOAL  #3   Title pt will demo speech 95% accurate in sentence-length responses with modified independence (compensations) over 3 sessions   Baseline --   Time --   Period --   Status Partially Met  one of three sessions     SLP SHORT TERM GOAL #4   Title pt will provide 4 memory strategies to SLP   Time --   Period --   Status Deferred  due to work with intelligibility and simple problem solviing     SLP SHORT TERM GOAL #5   Title pt will demo simple math, time, money tasks with 80% success   Time --   Period --   Status Not Met          SLP Long Term Goals - 04/11/16 1004      SLP LONG TERM GOAL #1   Title pt will demo functional speech in 20 minutes mod complex/complex conversation with modified independnence   Baseline new POC on 02-21-16   Time 7   Period --  visits, for all LTGs   Status On-going     SLP LONG TERM GOAL #2   Title pt will demo HEP for speech-motor planning indpendently over 3 total sessions    Time 7   Period --  visits   Status Revised     SLP LONG TERM GOAL #3   Title pt will demo HEP for dysarthria independently over 5 total sessions   Baseline 04-07-16   Time 7   Period --  visits   Status On-going     SLP LONG TERM GOAL #4   Title pt will demo functional organizational/sequencing ability for simple cognitive communication tasks   Time 8   Period --  visits   Status On-going     SLP LONG TERM GOAL #5   Title pt will demo evidence of memory system initiated   Time 9   Period --  visits   Status Revised          Plan - 04/11/16 1258    Clinical Impression Statement Pt's speech today was approx 90% WNL in 10 minutes min-mod complex conversation. In the last 3 minutes of the conversation, pt's "is[place] dialect" became slightly more prevalent.  Pt continues to exhibit severely reduced simple cognitive skills including reasoning/problem solving and organization, as well as decr'd attention to detail. Pt agrees with SLP he is not ready to  return to work at this time. Continue skilled ST to maximize speech intelligibilty, and cognition for independence and possible return to work in some capacity.  Speech Therapy Frequency 2x / week   Duration --  7 more visits   Treatment/Interventions SLP instruction and feedback;Compensatory strategies;Internal/external aids;Oral motor exercises;Patient/family education;Functional tasks;Cueing hierarchy;Cognitive reorganization   Potential to Achieve Goals Fair   Potential Considerations Severity of impairments      Patient will benefit from skilled therapeutic intervention in order to improve the following deficits and impairments:   Cognitive communication deficit  Apraxia  Dysarthria and anarthria    Problem List Patient Active Problem List   Diagnosis Date Noted  . Hemiparesis affecting right side as late effect of cerebrovascular accident ([place]) 02/28/2016  . Aphasia, late effect of cerebrovascular disease 02/28/2016  . Acute encephalopathy 01/08/2016  . Seizures ([place]) 01/08/2016  . History of ischemic left MCA stroke 01/08/2016  . Right sided weakness 01/08/2016  . Chronic combined systolic and diastolic CHF (congestive heart failure) ([place]) 01/08/2016  . Atrial fibrillation ([place]) 01/08/2016  . Leukocytosis 01/08/2016  . Benign essential HTN 01/08/2016  . Anemia of chronic disease 01/08/2016  . Controlled type 2 diabetes mellitus with diabetic nephropathy, without long-term current use of insulin ([place]) 01/08/2016  . History of DVT (deep vein thrombosis) 01/08/2016  . ESRD (end stage renal disease) on dialysis [facility])     [place] ,[place], [place]  04/11/2016, 1:04 PM  [facility] [address] [place], [place], [address] Phone: [phone]   Fax:  [phone]   Name: Isaiah [name] [facility] MRN: [id number] Date of Birth: Dec 18, 1965

## 2016-04-12 DIAGNOSIS — E1129 Type 2 diabetes mellitus with other diabetic kidney complication: Secondary | ICD-10-CM | POA: Diagnosis not present

## 2016-04-12 DIAGNOSIS — D688 Other specified coagulation defects: Secondary | ICD-10-CM | POA: Diagnosis not present

## 2016-04-12 DIAGNOSIS — N186 End stage renal disease: Secondary | ICD-10-CM | POA: Diagnosis not present

## 2016-04-12 DIAGNOSIS — D509 Iron deficiency anemia, unspecified: Secondary | ICD-10-CM | POA: Diagnosis not present

## 2016-04-12 DIAGNOSIS — T8249XA Other complication of vascular dialysis catheter, initial encounter: Secondary | ICD-10-CM | POA: Diagnosis not present

## 2016-04-14 ENCOUNTER — Ambulatory Visit: Payer: 59 | Admitting: Physical Therapy

## 2016-04-14 ENCOUNTER — Encounter: Payer: Self-pay | Admitting: Physical Therapy

## 2016-04-14 DIAGNOSIS — R482 Apraxia: Secondary | ICD-10-CM | POA: Diagnosis not present

## 2016-04-14 DIAGNOSIS — R2689 Other abnormalities of gait and mobility: Secondary | ICD-10-CM | POA: Diagnosis not present

## 2016-04-14 DIAGNOSIS — D631 Anemia in chronic kidney disease: Secondary | ICD-10-CM | POA: Diagnosis not present

## 2016-04-14 DIAGNOSIS — R278 Other lack of coordination: Secondary | ICD-10-CM | POA: Diagnosis not present

## 2016-04-14 DIAGNOSIS — M6281 Muscle weakness (generalized): Secondary | ICD-10-CM | POA: Diagnosis not present

## 2016-04-14 DIAGNOSIS — R2681 Unsteadiness on feet: Secondary | ICD-10-CM

## 2016-04-14 DIAGNOSIS — D509 Iron deficiency anemia, unspecified: Secondary | ICD-10-CM | POA: Diagnosis not present

## 2016-04-14 DIAGNOSIS — N186 End stage renal disease: Secondary | ICD-10-CM | POA: Diagnosis not present

## 2016-04-14 DIAGNOSIS — D688 Other specified coagulation defects: Secondary | ICD-10-CM | POA: Diagnosis not present

## 2016-04-14 DIAGNOSIS — E1129 Type 2 diabetes mellitus with other diabetic kidney complication: Secondary | ICD-10-CM | POA: Diagnosis not present

## 2016-04-14 DIAGNOSIS — R41841 Cognitive communication deficit: Secondary | ICD-10-CM | POA: Diagnosis not present

## 2016-04-14 DIAGNOSIS — I69315 Cognitive social or emotional deficit following cerebral infarction: Secondary | ICD-10-CM | POA: Diagnosis not present

## 2016-04-14 DIAGNOSIS — R279 Unspecified lack of coordination: Secondary | ICD-10-CM | POA: Diagnosis not present

## 2016-04-14 DIAGNOSIS — T8249XA Other complication of vascular dialysis catheter, initial encounter: Secondary | ICD-10-CM | POA: Diagnosis not present

## 2016-04-14 DIAGNOSIS — I69351 Hemiplegia and hemiparesis following cerebral infarction affecting right dominant side: Secondary | ICD-10-CM | POA: Diagnosis not present

## 2016-04-14 DIAGNOSIS — N2581 Secondary hyperparathyroidism of renal origin: Secondary | ICD-10-CM | POA: Diagnosis not present

## 2016-04-14 MED FILL — CONTOUR NEXT STRIPS: 33 days supply | Qty: 100 | Fill #2

## 2016-04-14 NOTE — Patient Instructions (Addendum)
All NON Dialysis Days  Walking Program:  Begin walking for exercise for 5 minutes, 2 times/day, 4 days/week.   Progress your walking program by adding 2 minutes to your routine each week, as tolerated. Working toward 1 20 minute walk each day. Be sure to wear good walking shoes, walk in a safe environment and only progress to your tolerance.      Strengthening ex's: Tuesdays and Saturday's  ANKLE: Dorsiflexion (Band)    Sit at edge of surface. Place band around top of foot. Keeping heel on floor, raise toes of banded foot.  Use green band.  Remember to pull band tight so you can feel resistance. 15 reps, rest for a minute and do 15 more.  Do twice a day. Copyright  VHI. All rights reserved.    Functional Quadriceps: Sit to Stand    Sit on edge of chair, feet flat on floor. Stand upright, extending knees fully. Repeat 10 times per set. Do 2 sessions per day.  http://orth.exer.us/734   Copyright  VHI. All rights reserved.    Forward Step Up    Stand at steps holding railing facing step. Step up leading with left leg. Slowly step down leading with left leg.  Do 10 reps then repeat with right leg.  Copyright  VHI. All rights reserved.    ABDUCTION: Standing - Resistance Band (Active)    Stand, feet flat. Against blue resistance band, lift right leg out to side. Slowly bring leg back in. Complete _1_ sets of 10___ repetitions. Perform _1-2__ sessions per day. Perform with both legs.  http://gtsc.exer.us/116   Copyright  VHI. All rights reserved.  Strengthening: Hip Extension - Resisted    With blue tubing around  ankle, face anchor and pull leg straight back. Slowly bring leg back in. Repeat __10__ times per set. Do __1 sets per session. Do __1-2__ sessions per day.   Perform with both legs.    Copyright  VHI. All rights reserved.  Strengthening: Hip Flexion - Resisted    With tubing around ankle, anchor behind, bring leg forward, keeping knee  straight. Slowly bring leg back in. Repeat __10__ times per set. Do __1 sets per session. Do __1-2__ sessions per day. Perform on both legs.   http://orth.exer.us/638   Copyright  VHI. All rights reserved.  ADDUCTION: Standing - Stable: Resistance Band (Active)    Stand, right leg out to side as far as possible. Against blue resistance band, draw leg in across midline. Slowly bring leg back in. Complete _1__ sets of _10_ repetitions. Perform _1-2__ sessions per day. Perform on both legs.  http://gtsc.exer.us/150   Copyright  VHI. All rights reserved.        Balance ex's: perform on Thursdays and Sundays  Feet Together, Varied Arm Positions - Eyes Closed    Stand in corner with a chair in front of you for support.  Stand with feet together and arms at your side.  Close eyes and visualize upright position. Hold 10 seconds. Repeat 2 times twice a day.  Copyright  VHI. All rights reserved.   Feet Together, Head Motion - Eyes Closed    Stand in the corner with a chair in front of you for support.  With eyes closed and feet together, move head slowly, up and down 10 times.  Repeat moving head side to side 10 times.  Do twice a day.  Copyright  VHI. All rights reserved.    MARRIOTT,AMY W., PT7/07/2016 10:15 AM Signed [place]    Tie  resisted band just above knees, and walk to left side with eyes open. Take even steps, leading with same foot. Make sure each foot lifts off the floor. Make sure to get your balance when you briefly stop.  Repeat in opposite direction. Repeat for _10-15___ feet per session. Do _1-2__ sessions per day. Be near counter for support.  Copyright  VHI. All rights reserved.

## 2016-04-15 DIAGNOSIS — E11319 Type 2 diabetes mellitus with unspecified diabetic retinopathy without macular edema: Secondary | ICD-10-CM | POA: Diagnosis not present

## 2016-04-15 DIAGNOSIS — H524 Presbyopia: Secondary | ICD-10-CM | POA: Diagnosis not present

## 2016-04-15 DIAGNOSIS — H52223 Regular astigmatism, bilateral: Secondary | ICD-10-CM | POA: Diagnosis not present

## 2016-04-15 DIAGNOSIS — E1165 Type 2 diabetes mellitus with hyperglycemia: Secondary | ICD-10-CM | POA: Diagnosis not present

## 2016-04-15 NOTE — Therapy (Signed)
[facility] [address] [place], [place], [address] Phone: [phone]   Fax:  [phone]  Physical Therapy Treatment  Patient Details  Name: Exzavier Ruderman [facility] MRN: [id number] Date of Birth: 1965-10-26 Referring Provider: Dr. Letta Pate  Encounter Date: 04/14/2016      PT End of Session - 04/14/16 1108    Visit Number 13   Number of Visits 17   Date for PT Re-Evaluation 04/22/16   Authorization Type BCBS   PT Start Time 1102   PT Stop Time 1145   PT Time Calculation (min) 43 min   Activity Tolerance Patient tolerated treatment well   Behavior During Therapy [facility] for tasks assessed/performed      Past Medical History:  Diagnosis Date  . A-fib ([place])   . Acute renal failure ([place]) 07/2011  . Anemia   . Asthma   . Diabetes mellitus without complication ([place])   . ESRD (end stage renal disease) on dialysis ([place])   . Gout   . History of recent blood transfusion 10/27/14   2 Units PRBC's  . Hyperkalemia 07/2011  . Hypertension   . Monoclonal gammopathies   . Monoclonal gammopathy   . OSA on CPAP   . Pulmonary embolism ([place]) 07/2011   a. Tx with Coumadin for 6 months (unknown cause per patient).  . Pulmonary embolism ([place]) 07/2011; 09/27/2014   a. Bilat PE 07/2011 - unclear cause, tx with 6 months Coumadin.;   . Seizures ([place])   . Sickle cell-thalassemia disease ([place])    a. Sickle cell trait.  . Sleep apnea    a. uses CPAP.  [name] Stroke [facility])     Past Surgical History:  Procedure Laterality Date  . AV FISTULA PLACEMENT Right 12/05/2015   Procedure: INSERTION OF ARTERIOVENOUS (AV) GORE-TEX GRAFT ARM;  Surgeon: Elam Dutch, MD;  Location: [facility];  Service: Vascular;  Laterality: Right;  . BONE MARROW BIOPSY    . CHOLECYSTECTOMY    . CHOLECYSTECTOMY  1990's?  . EYE SURGERY Right   . INSERTION OF DIALYSIS CATHETER Right 10/16/2015   Procedure: INSERTION OF PALINDROME DIALYSIS CATHETER ;  Surgeon: Elam Dutch,  MD;  Location: [facility];  Service: Vascular;  Laterality: Right;  . OTHER SURGICAL HISTORY     Retinal surgery  . PARS PLANA VITRECTOMY  02/17/2012   Procedure: PARS PLANA VITRECTOMY WITH 25 GAUGE;  Surgeon: Hayden Pedro, MD;  Location: [facility];  Service: Ophthalmology;  Laterality: Right;  . PERIPHERAL VASCULAR CATHETERIZATION N/A 03/20/2016   Procedure: A/V Shuntogram/Fistulagram;  Surgeon: Conrad Goldthwaite, MD;  Location: [facility];  Service: Cardiovascular;  Laterality: N/A;    There were no vitals filed for this visit.      Subjective Assessment - 04/14/16 1107    Subjective No new complaints. No falls to report or pain. States the exercises are going well at home.   Patient is accompained by: Family member  son, Mia Creek   Pertinent History Seizure 12/2015; DVT, PE-has IVC filter in placed, DM, CKD-stage V on dialysis, sickle cell anemia, OSA, asthma, HTN, tachycardia in hospital (12/06/15), AVF    Patient Stated Goals Pt's goal for therapy is to get back to full range of motion and return to work.   Currently in Pain? No/denies   Pain Score 0-No pain     Treatment:  Issued the following in a booklet/folder for pt to use as guide for HEP All NON Dialysis Days  Walking  Program:  Begin walking for exercise for 5 minutes, 2 times/day, 4 days/week.   Progress your walking program by adding 2 minutes to your routine each week, as tolerated. Working toward 1 20 minute walk each day. Be sure to wear good walking shoes, walk in a safe environment and only progress to your tolerance.      Strengthening ex's: Tuesdays and Saturday's  ANKLE: Dorsiflexion (Band)    Sit at edge of surface. Place band around top of foot. Keeping heel on floor, raise toes of banded foot.  Use green band.  Remember to pull band tight so you can feel resistance. 15 reps, rest for a minute and do 15 more.  Do twice a day. Copyright  VHI. All rights reserved.    Functional Quadriceps: Sit to  Stand    Sit on edge of chair, feet flat on floor. Stand upright, extending knees fully. Repeat 10 times per set. Do 2 sessions per day.  http://orth.exer.us/734   Copyright  VHI. All rights reserved.    Forward Step Up    Stand at steps holding railing facing step. Step up leading with left leg. Slowly step down leading with left leg.  Do 10 reps then repeat with right leg.  Copyright  VHI. All rights reserved.    ABDUCTION: Standing - Resistance Band (Active)    Stand, feet flat. Against blue resistance band, lift right leg out to side. Slowly bring leg back in. Complete _1_ sets of 10___ repetitions. Perform _1-2__ sessions per day. Perform with both legs.  http://gtsc.exer.us/116   Copyright  VHI. All rights reserved.  Strengthening: Hip Extension - Resisted    With blue tubing around  ankle, face anchor and pull leg straight back. Slowly bring leg back in. Repeat __10__ times per set. Do __1 sets per session. Do __1-2__ sessions per day.   Perform with both legs.    Copyright  VHI. All rights reserved.  Strengthening: Hip Flexion - Resisted    With tubing around ankle, anchor behind, bring leg forward, keeping knee straight. Slowly bring leg back in. Repeat __10__ times per set. Do __1 sets per session. Do __1-2__ sessions per day. Perform on both legs.   http://orth.exer.us/638   Copyright  VHI. All rights reserved.  ADDUCTION: Standing - Stable: Resistance Band (Active)    Stand, right leg out to side as far as possible. Against blue resistance band, draw leg in across midline. Slowly bring leg back in. Complete _1__ sets of _10_ repetitions. Perform _1-2__ sessions per day. Perform on both legs.  http://gtsc.exer.us/150   Copyright  VHI. All rights reserved.        Balance ex's: perform on Thursdays and Sundays  Feet Together, Varied Arm Positions - Eyes Closed    Stand in corner with a chair in front of you for support.   Stand with feet together and arms at your side.  Close eyes and visualize upright position. Hold 10 seconds. Repeat 2 times twice a day.  Copyright  VHI. All rights reserved.   Feet Together, Head Motion - Eyes Closed    Stand in the corner with a chair in front of you for support.  With eyes closed and feet together, move head slowly, up and down 10 times.  Repeat moving head side to side 10 times.  Do twice a day.  Copyright  VHI. All rights reserved.    MARRIOTT,AMY W., PT7/07/2016 10:15 AM Signed Side-Stepping    Tie resisted band just above knees,  and walk to left side with eyes open. Take even steps, leading with same foot. Make sure each foot lifts off the floor. Make sure to get your balance when you briefly stop.  Repeat in opposite direction. Repeat for _10-15___ feet per session. Do _1-2__ sessions per day. Be near counter for support.  Copyright  VHI. All rights reserved.   Pt able to perform all strengthening ex's in session today with handout with minimal cues on correct posture and ex form.           PT Education - 04/14/16 1144    Education provided Yes   Education Details Walking program: benefits of increased compliance for maintaining endurance/acitvity tolerance and that it is part of a balance exercise program which will aide in CVA prevention (of 2cd stroke);HEP: reissued all exercises issued to date with schedule for what days to perform which ones. all issued in blue folder, pt to bring folder with him to each visit so additional ex's can be added as they are issued.   Person(s) Educated Patient;Child(ren)  son, Mia Creek   Methods Explanation;Demonstration;Verbal cues;Handout   Comprehension Verbalized understanding;Returned demonstration;Verbal cues required;Need further instruction          PT Short Term Goals - 04/03/16 1440      PT SHORT TERM GOAL #1   Title Pt will perform HEP independenlty for improved strength, balance and gait.   TARGET 03/23/16   Baseline Per progress note on 03/27/16   Time 4   Period Weeks   Status Partially Met     PT SHORT TERM GOAL #2   Title Pt will improve Berg Score to at least 49/56 for decreased fall risk.   Baseline per progress note. 49/56.  03/27/16   Time 4   Period Weeks   Status Achieved     PT SHORT TERM GOAL #3   Title Pt will improve TUG score to less than or equal to 13.5 seconds for decreased fall risk.   Baseline Per progress note, 8.84 seconds no AD. 03/27/16   Time 4   Period Weeks   Status Achieved     PT SHORT TERM GOAL #4   Title Pt will amb. 300' over even/uneven terrain with LRAD at MOD I level.   Baseline per progress not 03/27/16   Status Achieved           PT Long Term Goals - 02/22/16 1245      PT LONG TERM GOAL #1   Title Pt will verbalize understanding of fall prevention in the home environment.  TARGET 04/22/16   Time 8   Period Weeks   Status New     PT LONG TERM GOAL #2   Title Pt will improve Dynamic Gait Index score to at least 19/24 for decreased fall risk.   Time 8   Period Weeks   Status New     PT LONG TERM GOAL #3   Title Pt will improve single limb stance to at least 3 seconds each leg for improved negotiation of steps and obstacles.   Time 8   Period Weeks   Status New     PT LONG TERM GOAL #4   Title Pt will amb. 600' over even/uneven terrain IND without AD to improve functional moblity.    Time 8   Period Weeks   Status New     PT LONG TERM GOAL #5   Title Pt will ascend/descent 12 steps with one handrail at MOD I  level to negotiate steps at home.   Time 8   Period Weeks   Status New               Plan - 04/14/16 1108    Clinical Impression Statement Today's session focused on consolidation of HEP exercises issued to date and establishing a grid/plan for what ex's to perform on which days. Added written instructions for walking program and standing hip ex's . Pt able to perform all strengthening ex's with  handout and minimal cues. Pt is making steady progress toward goals, howver continues to need encouragement to be compliant with activity/exericses outside of therapy.                                  Rehab Potential Good   Clinical Impairments Affecting Rehab Potential co-morbidities   PT Frequency 2x / week   PT Duration 8 weeks   PT Treatment/Interventions ADLs/Self Care Home Management;Therapeutic exercise;Therapeutic activities;Functional mobility training;Gait training;Balance training;Neuromuscular re-education;Patient/family education   PT Next Visit Plan review current balance HEP (pt is to bring folder back with him) and advance if needed;continue with strengthening, balance (emphasis on single leg activites) and on gait (emphasis on foot clearance with swing phase) toward LTGS. PT plan is to discharge at end of POC.   Consulted and Agree with Plan of Care Patient      Patient will benefit from skilled therapeutic intervention in order to improve the following deficits and impairments:  Abnormal gait, Decreased balance, Decreased mobility, Decreased strength, Difficulty walking, Postural dysfunction  Visit Diagnosis: Other abnormalities of gait and mobility  Other lack of coordination  Unsteadiness on feet     Problem List Patient Active Problem List   Diagnosis Date Noted  . Hemiparesis affecting right side as late effect of cerebrovascular accident ([place]) 02/28/2016  . Aphasia, late effect of cerebrovascular disease 02/28/2016  . Acute encephalopathy 01/08/2016  . Seizures ([place]) 01/08/2016  . History of ischemic left MCA stroke 01/08/2016  . Right sided weakness 01/08/2016  . Chronic combined systolic and diastolic CHF (congestive heart failure) ([place]) 01/08/2016  . Atrial fibrillation ([place]) 01/08/2016  . Leukocytosis 01/08/2016  . Benign essential HTN 01/08/2016  . Anemia of chronic disease 01/08/2016  . Controlled type 2 diabetes mellitus with diabetic nephropathy,  without long-term current use of insulin ([place]) 01/08/2016  . History of DVT (deep vein thrombosis) 01/08/2016  . ESRD (end stage renal disease) on dialysis ([place])     Willow Ora, PTA, [facility] [address] [place], [place] [address] [phone] 04/15/16, 11:44 AM   Name: Steward [facility] MRN: [id number] Date of Birth: 08-21-1966

## 2016-04-16 DIAGNOSIS — N2581 Secondary hyperparathyroidism of renal origin: Secondary | ICD-10-CM | POA: Diagnosis not present

## 2016-04-16 DIAGNOSIS — D688 Other specified coagulation defects: Secondary | ICD-10-CM | POA: Diagnosis not present

## 2016-04-16 DIAGNOSIS — T8249XA Other complication of vascular dialysis catheter, initial encounter: Secondary | ICD-10-CM | POA: Diagnosis not present

## 2016-04-16 DIAGNOSIS — D509 Iron deficiency anemia, unspecified: Secondary | ICD-10-CM | POA: Diagnosis not present

## 2016-04-16 DIAGNOSIS — N186 End stage renal disease: Secondary | ICD-10-CM | POA: Diagnosis not present

## 2016-04-16 DIAGNOSIS — D631 Anemia in chronic kidney disease: Secondary | ICD-10-CM | POA: Diagnosis not present

## 2016-04-16 DIAGNOSIS — E1129 Type 2 diabetes mellitus with other diabetic kidney complication: Secondary | ICD-10-CM | POA: Diagnosis not present

## 2016-04-17 ENCOUNTER — Ambulatory Visit: Payer: 59

## 2016-04-17 DIAGNOSIS — I69315 Cognitive social or emotional deficit following cerebral infarction: Secondary | ICD-10-CM | POA: Diagnosis not present

## 2016-04-17 DIAGNOSIS — R278 Other lack of coordination: Secondary | ICD-10-CM | POA: Diagnosis not present

## 2016-04-17 DIAGNOSIS — R279 Unspecified lack of coordination: Secondary | ICD-10-CM | POA: Diagnosis not present

## 2016-04-17 DIAGNOSIS — R2681 Unsteadiness on feet: Secondary | ICD-10-CM | POA: Diagnosis not present

## 2016-04-17 DIAGNOSIS — R482 Apraxia: Secondary | ICD-10-CM | POA: Diagnosis not present

## 2016-04-17 DIAGNOSIS — R2689 Other abnormalities of gait and mobility: Secondary | ICD-10-CM

## 2016-04-17 DIAGNOSIS — R41841 Cognitive communication deficit: Secondary | ICD-10-CM | POA: Diagnosis not present

## 2016-04-17 DIAGNOSIS — M6281 Muscle weakness (generalized): Secondary | ICD-10-CM | POA: Diagnosis not present

## 2016-04-17 DIAGNOSIS — I69351 Hemiplegia and hemiparesis following cerebral infarction affecting right dominant side: Secondary | ICD-10-CM | POA: Diagnosis not present

## 2016-04-17 NOTE — Therapy (Signed)
[facility] [address] [place], [place], [address] Phone: [phone]   Fax:  [phone]  Physical Therapy Treatment  Patient Details  Name: Isaiah [name] [facility] MRN: [id number] Date of Birth: Aug 17, 1966 Referring Provider: Dr. Letta Pate  Encounter Date: 04/17/2016      PT End of Session - 04/17/16 1201    Visit Number 14   Number of Visits 17   Date for PT Re-Evaluation 04/22/16   Authorization Type BCBS   PT Start Time 1019   PT Stop Time 1058   PT Time Calculation (min) 39 min   Equipment Utilized During Treatment --  min guard to min A prn   Activity Tolerance Patient tolerated treatment well   Behavior During Therapy WFL for tasks assessed/performed      Past Medical History:  Diagnosis Date  . A-fib ([place])   . Acute renal failure ([place]) 07/2011  . Anemia   . Asthma   . Diabetes mellitus without complication ([place])   . ESRD (end stage renal disease) on dialysis ([place])   . Gout   . History of recent blood transfusion 10/27/14   2 Units PRBC's  . Hyperkalemia 07/2011  . Hypertension   . Monoclonal gammopathies   . Monoclonal gammopathy   . OSA on CPAP   . Pulmonary embolism ([place]) 07/2011   a. Tx with Coumadin for 6 months (unknown cause per patient).  . Pulmonary embolism ([place]) 07/2011; 09/27/2014   a. Bilat PE 07/2011 - unclear cause, tx with 6 months Coumadin.;   . Seizures ([place])   . Sickle cell-thalassemia disease ([place])    a. Sickle cell trait.  . Sleep apnea    a. uses CPAP.  [name] Stroke [facility])     Past Surgical History:  Procedure Laterality Date  . AV FISTULA PLACEMENT Right 12/05/2015   Procedure: INSERTION OF ARTERIOVENOUS (AV) GORE-TEX GRAFT ARM;  Surgeon: Elam Dutch, MD;  Location: [place];  Service: Vascular;  Laterality: Right;  . BONE MARROW BIOPSY    . CHOLECYSTECTOMY    . CHOLECYSTECTOMY  1990's?  . EYE SURGERY Right   . INSERTION OF DIALYSIS CATHETER Right 10/16/2015   Procedure:  INSERTION OF PALINDROME DIALYSIS CATHETER ;  Surgeon: Elam Dutch, MD;  Location: [place];  Service: Vascular;  Laterality: Right;  . OTHER SURGICAL HISTORY     Retinal surgery  . PARS PLANA VITRECTOMY  02/17/2012   Procedure: PARS PLANA VITRECTOMY WITH 25 GAUGE;  Surgeon: Hayden Pedro, MD;  Location: [place];  Service: Ophthalmology;  Laterality: Right;  . PERIPHERAL VASCULAR CATHETERIZATION N/A 03/20/2016   Procedure: A/V Shuntogram/Fistulagram;  Surgeon: Conrad Acacia Villas, MD;  Location: [facility];  Service: Cardiovascular;  Laterality: N/A;    There were no vitals filed for this visit.      Subjective Assessment - 04/17/16 1022    Subjective Pt denied falls or changes since last visit.    Pertinent History Seizure 12/2015; DVT, PE-has IVC filter in placed, DM, CKD-stage V on dialysis, sickle cell anemia, OSA, asthma, HTN, tachycardia in hospital (12/06/15), AVF    Patient Stated Goals Pt's goal for therapy is to get back to full range of motion and return to work.   Currently in Pain? No/denies        Neuro re-ed: Pt performed at counter or in corner with chair in front for safety, with 0-1 UE support. Performed with min guard to S for safety and cues for  technique.  Pt performed all balance exercises on compliant and non-compliant surfaces and progressed from blue theraband around knees during sidestepping to black theraband. Pt denied dizziness at end of session. Please see pt instructions for details.                 [facility] Adult PT Treatment/Exercise - 04/17/16 1047      High Level Balance   High Level Balance Activities Head turns;Marching forwards;Backward walking;Other (comment)  forward with eyes closed   High Level Balance Comments Performed over blue and red mats (2-4reps/activity) with min guard to min A to ensure safety and maintain balance. Cues for technique and to improve eccentric control.                PT Education - 04/17/16 1046     Education provided Yes   Education Details PT reviewed previous balance HEP and progressed as tolerated. PT discussed assessing goals next week and potential d/c next week.   Person(s) Educated Patient   Methods Explanation;Demonstration;Tactile cues;Verbal cues;Handout   Comprehension Returned demonstration;Verbalized understanding          PT Short Term Goals - 04/03/16 1440      PT SHORT TERM GOAL #1   Title Pt will perform HEP independenlty for improved strength, balance and gait.  TARGET 03/23/16   Baseline Per progress note on 03/27/16   Time 4   Period Weeks   Status Partially Met     PT SHORT TERM GOAL #2   Title Pt will improve Berg Score to at least 49/56 for decreased fall risk.   Baseline per progress note. 49/56.  03/27/16   Time 4   Period Weeks   Status Achieved     PT SHORT TERM GOAL #3   Title Pt will improve TUG score to less than or equal to 13.5 seconds for decreased fall risk.   Baseline Per progress note, 8.84 seconds no AD. 03/27/16   Time 4   Period Weeks   Status Achieved     PT SHORT TERM GOAL #4   Title Pt will amb. 300' over even/uneven terrain with LRAD at MOD I level.   Baseline per progress not 03/27/16   Status Achieved           PT Long Term Goals - 02/22/16 1245      PT LONG TERM GOAL #1   Title Pt will verbalize understanding of fall prevention in the home environment.  TARGET 04/22/16   Time 8   Period Weeks   Status New     PT LONG TERM GOAL #2   Title Pt will improve Dynamic Gait Index score to at least 19/24 for decreased fall risk.   Time 8   Period Weeks   Status New     PT LONG TERM GOAL #3   Title Pt will improve single limb stance to at least 3 seconds each leg for improved negotiation of steps and obstacles.   Time 8   Period Weeks   Status New     PT LONG TERM GOAL #4   Title Pt will amb. 600' over even/uneven terrain IND without AD to improve functional moblity.    Time 8   Period Weeks   Status New     PT  LONG TERM GOAL #5   Title Pt will ascend/descent 12 steps with one handrail at MOD I level to negotiate steps at home.   Time 8   Period Weeks  Status New               Plan - 04/17/16 1202    Clinical Impression Statement Pt demonstrated progress, as he was able to progress balance HEP from non-compliant surface to compliant surfaces and SLS activities. Pt continues to experience incr. postural sway and LOB during activities which require incr. vestibular input and incr. time on one LE. Continue with POC.    Rehab Potential Good   Clinical Impairments Affecting Rehab Potential co-morbidities   PT Frequency 2x / week   PT Duration 8 weeks   PT Treatment/Interventions ADLs/Self Care Home Management;Therapeutic exercise;Therapeutic activities;Functional mobility training;Gait training;Balance training;Neuromuscular re-education;Patient/family education   PT Next Visit Plan Begin to assess LTGs and discuss renew vs. d/c with primary PT.    PT Home Exercise Plan Strengthening and balance HEP on days pt does not have dialysis.   Consulted and Agree with Plan of Care Patient      Patient will benefit from skilled therapeutic intervention in order to improve the following deficits and impairments:  Abnormal gait, Decreased balance, Decreased mobility, Decreased strength, Difficulty walking, Postural dysfunction  Visit Diagnosis: Other abnormalities of gait and mobility  Other lack of coordination     Problem List Patient Active Problem List   Diagnosis Date Noted  . Hemiparesis affecting right side as late effect of cerebrovascular accident ([place]) 02/28/2016  . Aphasia, late effect of cerebrovascular disease 02/28/2016  . Acute encephalopathy 01/08/2016  . Seizures ([place]) 01/08/2016  . History of ischemic left MCA stroke 01/08/2016  . Right sided weakness 01/08/2016  . Chronic combined systolic and diastolic CHF (congestive heart failure) ([place]) 01/08/2016  . Atrial  fibrillation ([place]) 01/08/2016  . Leukocytosis 01/08/2016  . Benign essential HTN 01/08/2016  . Anemia of chronic disease 01/08/2016  . Controlled type 2 diabetes mellitus with diabetic nephropathy, without long-term current use of insulin ([place]) 01/08/2016  . History of DVT (deep vein thrombosis) 01/08/2016  . ESRD (end stage renal disease) on dialysis [facility])     Zyaira Vejar L 04/17/2016, 12:04 PM  [facility] [address] [place], [place], [address] Phone: [phone]   Fax:  [phone]  Name: Kaivon Livesey [facility] MRN: [id number] Date of Birth: 1965/12/11   Geoffry Paradise, PT,DPT 04/17/16 12:04 PM Phone: [phone] Fax: [phone]

## 2016-04-17 NOTE — Patient Instructions (Addendum)
Perform in corner with chair in front of you for safety:  Feet Together (Compliant Surface) Head Motion - Eyes Closed    Stand on compliant surface: __pillow/cushion______ with feet together. Close eyes and move head slowly, up and down 5 times and side to side 5 times. Repeat __3__ times per session. Do __1__ sessions per day.  Copyright  VHI. All rights reserved.  Single Leg - Eyes Open    Holding support, lift right leg while maintaining balance over other leg. Progress to removing hands from support surface for longer periods of time. Hold__10__ seconds. Repeat with other leg. Repeat _3___ times per session per leg. Do __1__ sessions per day.  Copyright  VHI. All rights reserved.  Braiding    Holding onto counter at home. Move to side: 1) cross right leg in front of left, 2) bring back leg out to side, then 3) cross right leg behind left, 4) bring left leg out to side. Continue sequence in same direction. Reverse sequence, moving in opposite direction. Repeat sequence __4__ times per session. Do ___1_ sessions per day.   Copyright  VHI. All rights reserved.

## 2016-04-18 DIAGNOSIS — E1129 Type 2 diabetes mellitus with other diabetic kidney complication: Secondary | ICD-10-CM | POA: Diagnosis not present

## 2016-04-18 DIAGNOSIS — N186 End stage renal disease: Secondary | ICD-10-CM | POA: Diagnosis not present

## 2016-04-18 DIAGNOSIS — D631 Anemia in chronic kidney disease: Secondary | ICD-10-CM | POA: Diagnosis not present

## 2016-04-18 DIAGNOSIS — T8249XA Other complication of vascular dialysis catheter, initial encounter: Secondary | ICD-10-CM | POA: Diagnosis not present

## 2016-04-18 DIAGNOSIS — D509 Iron deficiency anemia, unspecified: Secondary | ICD-10-CM | POA: Diagnosis not present

## 2016-04-18 DIAGNOSIS — N2581 Secondary hyperparathyroidism of renal origin: Secondary | ICD-10-CM | POA: Diagnosis not present

## 2016-04-18 DIAGNOSIS — D688 Other specified coagulation defects: Secondary | ICD-10-CM | POA: Diagnosis not present

## 2016-04-21 ENCOUNTER — Ambulatory Visit: Payer: 59 | Admitting: Physical Therapy

## 2016-04-21 ENCOUNTER — Encounter: Payer: Self-pay | Admitting: Physical Therapy

## 2016-04-21 DIAGNOSIS — T8249XA Other complication of vascular dialysis catheter, initial encounter: Secondary | ICD-10-CM | POA: Diagnosis not present

## 2016-04-21 DIAGNOSIS — R2689 Other abnormalities of gait and mobility: Secondary | ICD-10-CM | POA: Diagnosis not present

## 2016-04-21 DIAGNOSIS — R2681 Unsteadiness on feet: Secondary | ICD-10-CM | POA: Diagnosis not present

## 2016-04-21 DIAGNOSIS — E1129 Type 2 diabetes mellitus with other diabetic kidney complication: Secondary | ICD-10-CM | POA: Diagnosis not present

## 2016-04-21 DIAGNOSIS — R278 Other lack of coordination: Secondary | ICD-10-CM | POA: Diagnosis not present

## 2016-04-21 DIAGNOSIS — R41841 Cognitive communication deficit: Secondary | ICD-10-CM | POA: Diagnosis not present

## 2016-04-21 DIAGNOSIS — I69351 Hemiplegia and hemiparesis following cerebral infarction affecting right dominant side: Secondary | ICD-10-CM | POA: Diagnosis not present

## 2016-04-21 DIAGNOSIS — R482 Apraxia: Secondary | ICD-10-CM | POA: Diagnosis not present

## 2016-04-21 DIAGNOSIS — I69315 Cognitive social or emotional deficit following cerebral infarction: Secondary | ICD-10-CM | POA: Diagnosis not present

## 2016-04-21 DIAGNOSIS — M6281 Muscle weakness (generalized): Secondary | ICD-10-CM | POA: Diagnosis not present

## 2016-04-21 DIAGNOSIS — R279 Unspecified lack of coordination: Secondary | ICD-10-CM | POA: Diagnosis not present

## 2016-04-21 DIAGNOSIS — D509 Iron deficiency anemia, unspecified: Secondary | ICD-10-CM | POA: Diagnosis not present

## 2016-04-21 DIAGNOSIS — N186 End stage renal disease: Secondary | ICD-10-CM | POA: Diagnosis not present

## 2016-04-21 DIAGNOSIS — D688 Other specified coagulation defects: Secondary | ICD-10-CM | POA: Diagnosis not present

## 2016-04-21 MED FILL — RENA-VITE TABLET: 30 days supply | Qty: 30 | Fill #1

## 2016-04-21 NOTE — Therapy (Signed)
[facility] [address] [place], [place], [address] Phone: [phone]   Fax:  [phone]  Physical Therapy Treatment  Patient Details  Name: Isaiah [name] [facility] MRN: [id number] Date of Birth: February 22, 1966 Referring Provider: Dr. Letta Pate  Encounter Date: 04/21/2016      PT End of Session - 04/21/16 1100    Visit Number 15   Number of Visits 17   Date for PT Re-Evaluation 04/22/16   Authorization Type BCBS   PT Start Time 1020   PT Stop Time 1100   PT Time Calculation (min) 40 min   Equipment Utilized During Treatment --  min guard to min A prn   Activity Tolerance Patient tolerated treatment well   Behavior During Therapy WFL for tasks assessed/performed      Past Medical History:  Diagnosis Date  . A-fib ([place])   . Acute renal failure ([place]) 07/2011  . Anemia   . Asthma   . Diabetes mellitus without complication ([place])   . ESRD (end stage renal disease) on dialysis ([place])   . Gout   . History of recent blood transfusion 10/27/14   2 Units PRBC's  . Hyperkalemia 07/2011  . Hypertension   . Monoclonal gammopathies   . Monoclonal gammopathy   . OSA on CPAP   . Pulmonary embolism ([place]) 07/2011   a. Tx with Coumadin for 6 months (unknown cause per patient).  . Pulmonary embolism ([place]) 07/2011; 09/27/2014   a. Bilat PE 07/2011 - unclear cause, tx with 6 months Coumadin.;   . Seizures ([place])   . Sickle cell-thalassemia disease ([place])    a. Sickle cell trait.  . Sleep apnea    a. uses CPAP.  [name] Stroke [facility])     Past Surgical History:  Procedure Laterality Date  . AV FISTULA PLACEMENT Right 12/05/2015   Procedure: INSERTION OF ARTERIOVENOUS (AV) GORE-TEX GRAFT ARM;  Surgeon: Elam Dutch, MD;  Location: [place];  Service: Vascular;  Laterality: Right;  . BONE MARROW BIOPSY    . CHOLECYSTECTOMY    . CHOLECYSTECTOMY  1990's?  . EYE SURGERY Right   . INSERTION OF DIALYSIS CATHETER Right 10/16/2015   Procedure:  INSERTION OF PALINDROME DIALYSIS CATHETER ;  Surgeon: Elam Dutch, MD;  Location: [place];  Service: Vascular;  Laterality: Right;  . OTHER SURGICAL HISTORY     Retinal surgery  . PARS PLANA VITRECTOMY  02/17/2012   Procedure: PARS PLANA VITRECTOMY WITH 25 GAUGE;  Surgeon: Hayden Pedro, MD;  Location: [place];  Service: Ophthalmology;  Laterality: Right;  . PERIPHERAL VASCULAR CATHETERIZATION N/A 03/20/2016   Procedure: A/V Shuntogram/Fistulagram;  Surgeon: Conrad Wallula, MD;  Location: [place] [facility];  Service: Cardiovascular;  Laterality: N/A;    There were no vitals filed for this visit.      Subjective Assessment - 04/21/16 1022    Subjective Pt denied falls or changes since last visit.  Worked on HEP since last; no questions on how to perform.   Pertinent History Seizure 12/2015; DVT, PE-has IVC filter in placed, DM, CKD-stage V on dialysis, sickle cell anemia, OSA, asthma, HTN, tachycardia in hospital (12/06/15), AVF    Patient Stated Goals Pt's goal for therapy is to get back to full range of motion and return to work.   Currently in Pain? No/denies            [facility] PT Assessment - 04/21/16 0001      Dynamic Gait Index  Level Surface Normal   Change in Gait Speed Normal   Gait with Horizontal Head Turns Normal   Gait with Vertical Head Turns Normal   Gait and Pivot Turn Normal   Step Over Obstacle Normal   Step Around Obstacles Normal   Steps Mild Impairment   Total Score 23   DGI comment: Scores <19/24 indicate increased fall risk.                          Balance Exercises - 04/21/16 1039      Balance Exercises: Standing   Standing Eyes Opened Wide (BOA);Foam/compliant surface  SLS with cone taps, required intermittent UE support.   SLS Eyes open;Modified;30 secs;Intermittent upper extremity support  foot on ball;   Sidestepping Other (comment)   with SLS activity            PT Education - 04/21/16 1023    Education provided Yes    Education Details Fall Prevention handout   Person(s) Educated Patient   Methods Explanation;Handout   Comprehension Verbalized understanding          PT Short Term Goals - 04/03/16 1440      PT SHORT TERM GOAL #1   Title Pt will perform HEP independenlty for improved strength, balance and gait.  TARGET 03/23/16   Baseline Per progress note on 03/27/16   Time 4   Period Weeks   Status Partially Met     PT SHORT TERM GOAL #2   Title Pt will improve Berg Score to at least 49/56 for decreased fall risk.   Baseline per progress note. 49/56.  03/27/16   Time 4   Period Weeks   Status Achieved     PT SHORT TERM GOAL #3   Title Pt will improve TUG score to less than or equal to 13.5 seconds for decreased fall risk.   Baseline Per progress note, 8.84 seconds no AD. 03/27/16   Time 4   Period Weeks   Status Achieved     PT SHORT TERM GOAL #4   Title Pt will amb. 300' over even/uneven terrain with LRAD at MOD I level.   Baseline per progress not 03/27/16   Status Achieved           PT Long Term Goals - 04/21/16 1043      PT LONG TERM GOAL #1   Title Pt will verbalize understanding of fall prevention in the home environment.  TARGET 04/22/16   Baseline met; 04/21/16   Time 8   Period Weeks   Status Achieved     PT LONG TERM GOAL #2   Title Pt will improve Dynamic Gait Index score to at least 19/24 for decreased fall risk.   Baseline 23/24; 04/21/16   Time 8   Period Weeks   Status Achieved     PT LONG TERM GOAL #3   Title Pt will improve single limb stance to at least 3 seconds each leg for improved negotiation of steps and obstacles.   Time 8   Period Weeks   Status New     PT LONG TERM GOAL #4   Title Pt will amb. 600' over even/uneven terrain IND without AD to improve functional moblity.    Time 8   Period Weeks   Status New     PT LONG TERM GOAL #5   Title Pt will ascend/descent 12 steps with one handrail at MOD I level to negotiate steps  at home.    Baseline Met; 04/21/16   Time 8   Period Weeks   Status Achieved               Plan - 04/21/16 1359    Clinical Impression Statement Initiated LTG check; pt met #1,2,5 demonstrating significant functional gains with strength and balance. Pt plans to d/c next visit.   Rehab Potential Good   Clinical Impairments Affecting Rehab Potential co-morbidities   PT Frequency 2x / week   PT Duration 8 weeks   PT Treatment/Interventions ADLs/Self Care Home Management;Therapeutic exercise;Therapeutic activities;Functional mobility training;Gait training;Balance training;Neuromuscular re-education;Patient/family education   PT Next Visit Plan Begin to assess LTGs and discuss renew vs. d/c with primary PT.    PT Home Exercise Plan Strengthening and balance HEP on days pt does not have dialysis.   Consulted and Agree with Plan of Care Patient      Patient will benefit from skilled therapeutic intervention in order to improve the following deficits and impairments:  Abnormal gait, Decreased balance, Decreased mobility, Decreased strength, Difficulty walking, Postural dysfunction  Visit Diagnosis: Other abnormalities of gait and mobility  Unsteadiness on feet  Unspecified lack of coordination     Problem List Patient Active Problem List   Diagnosis Date Noted  . Hemiparesis affecting right side as late effect of cerebrovascular accident ([place]) 02/28/2016  . Aphasia, late effect of cerebrovascular disease 02/28/2016  . Acute encephalopathy 01/08/2016  . Seizures ([place]) 01/08/2016  . History of ischemic left MCA stroke 01/08/2016  . Right sided weakness 01/08/2016  . Chronic combined systolic and diastolic CHF (congestive heart failure) ([place]) 01/08/2016  . Atrial fibrillation ([place]) 01/08/2016  . Leukocytosis 01/08/2016  . Benign essential HTN 01/08/2016  . Anemia of chronic disease 01/08/2016  . Controlled type 2 diabetes mellitus with diabetic nephropathy, without long-term current use  of insulin ([place]) 01/08/2016  . History of DVT (deep vein thrombosis) 01/08/2016  . ESRD (end stage renal disease) on dialysis [name])     Bjorn Loser, PTA  04/21/16, 3:56 PM [facility] [address] [place], [place], [address] Phone: [phone]   Fax:  [phone]  Name: Xan Ingraham [facility] MRN: [id number] Date of Birth: 13-Mar-1966

## 2016-04-23 DIAGNOSIS — N186 End stage renal disease: Secondary | ICD-10-CM | POA: Diagnosis not present

## 2016-04-23 DIAGNOSIS — T8249XA Other complication of vascular dialysis catheter, initial encounter: Secondary | ICD-10-CM | POA: Diagnosis not present

## 2016-04-23 DIAGNOSIS — E1129 Type 2 diabetes mellitus with other diabetic kidney complication: Secondary | ICD-10-CM | POA: Diagnosis not present

## 2016-04-23 DIAGNOSIS — D688 Other specified coagulation defects: Secondary | ICD-10-CM | POA: Diagnosis not present

## 2016-04-23 DIAGNOSIS — D509 Iron deficiency anemia, unspecified: Secondary | ICD-10-CM | POA: Diagnosis not present

## 2016-04-23 MED FILL — WARFARIN SODIUM 5 MG TABLET: 5 | 90 days supply | Qty: 135 | Fill #1

## 2016-04-24 ENCOUNTER — Ambulatory Visit: Payer: 59 | Admitting: Physical Therapy

## 2016-04-24 DIAGNOSIS — M6281 Muscle weakness (generalized): Secondary | ICD-10-CM | POA: Diagnosis not present

## 2016-04-24 DIAGNOSIS — R279 Unspecified lack of coordination: Secondary | ICD-10-CM

## 2016-04-24 DIAGNOSIS — R2681 Unsteadiness on feet: Secondary | ICD-10-CM

## 2016-04-24 DIAGNOSIS — R2689 Other abnormalities of gait and mobility: Secondary | ICD-10-CM

## 2016-04-24 DIAGNOSIS — R278 Other lack of coordination: Secondary | ICD-10-CM | POA: Diagnosis not present

## 2016-04-24 DIAGNOSIS — I69351 Hemiplegia and hemiparesis following cerebral infarction affecting right dominant side: Secondary | ICD-10-CM | POA: Diagnosis not present

## 2016-04-24 DIAGNOSIS — R482 Apraxia: Secondary | ICD-10-CM | POA: Diagnosis not present

## 2016-04-24 DIAGNOSIS — R41841 Cognitive communication deficit: Secondary | ICD-10-CM | POA: Diagnosis not present

## 2016-04-24 DIAGNOSIS — I69315 Cognitive social or emotional deficit following cerebral infarction: Secondary | ICD-10-CM | POA: Diagnosis not present

## 2016-04-24 NOTE — Therapy (Signed)
[facility] [address] [place], [place], [address] Phone: [phone]   Fax:  [phone]  Physical Therapy Treatment  Patient Details  Name: Isaiah [name] [facility] MRN: [id number] Date of Birth: 02-21-1966 Referring Provider: Dr. Letta Pate  Encounter Date: 04/24/2016      PT End of Session - 04/24/16 1120    Visit Number 16   Number of Visits 17   Date for PT Re-Evaluation 04/22/16   Authorization Type BCBS   PT Start Time 1021   PT Stop Time 7096  ended treatament early per pt request   PT Time Calculation (min) 26 min   Equipment Utilized During Treatment --  min guard to min A prn   Activity Tolerance Patient tolerated treatment well   Behavior During Therapy [facility] for tasks assessed/performed      Past Medical History:  Diagnosis Date  . A-fib ([place])   . Acute renal failure ([place]) 07/2011  . Anemia   . Asthma   . Diabetes mellitus without complication ([place])   . ESRD (end stage renal disease) on dialysis ([place])   . Gout   . History of recent blood transfusion 10/27/14   2 Units PRBC's  . Hyperkalemia 07/2011  . Hypertension   . Monoclonal gammopathies   . Monoclonal gammopathy   . OSA on CPAP   . Pulmonary embolism ([place]) 07/2011   a. Tx with Coumadin for 6 months (unknown cause per patient).  . Pulmonary embolism ([place]) 07/2011; 09/27/2014   a. Bilat PE 07/2011 - unclear cause, tx with 6 months Coumadin.;   . Seizures ([place])   . Sickle cell-thalassemia disease ([place])    a. Sickle cell trait.  . Sleep apnea    a. uses CPAP.  [name] Stroke [facility])     Past Surgical History:  Procedure Laterality Date  . AV FISTULA PLACEMENT Right 12/05/2015   Procedure: INSERTION OF ARTERIOVENOUS (AV) GORE-TEX GRAFT ARM;  Surgeon: Elam Dutch, MD;  Location: [place];  Service: Vascular;  Laterality: Right;  . BONE MARROW BIOPSY    . CHOLECYSTECTOMY    . CHOLECYSTECTOMY  1990's?  . EYE SURGERY Right   . INSERTION OF DIALYSIS  CATHETER Right 10/16/2015   Procedure: INSERTION OF PALINDROME DIALYSIS CATHETER ;  Surgeon: Elam Dutch, MD;  Location: [place];  Service: Vascular;  Laterality: Right;  . OTHER SURGICAL HISTORY     Retinal surgery  . PARS PLANA VITRECTOMY  02/17/2012   Procedure: PARS PLANA VITRECTOMY WITH 25 GAUGE;  Surgeon: Hayden Pedro, MD;  Location: [place];  Service: Ophthalmology;  Laterality: Right;  . PERIPHERAL VASCULAR CATHETERIZATION N/A 03/20/2016   Procedure: A/V Shuntogram/Fistulagram;  Surgeon: Conrad Delta, MD;  Location: [place] CV LAB;  Service: Cardiovascular;  Laterality: N/A;    There were no vitals filed for this visit.      Subjective Assessment - 04/24/16 1022    Subjective Pt feels that walking is better since starting therapy   Pertinent History Seizure 12/2015; DVT, PE-has IVC filter in placed, DM, CKD-stage V on dialysis, sickle cell anemia, OSA, asthma, HTN, tachycardia in hospital (12/06/15), AVF    Patient Stated Goals Pt's goal for therapy is to get back to full range of motion and return to work.   Currently in Pain? No/denies                         [facility] Adult PT Treatment/Exercise - 04/24/16  0001      Ambulation/Gait   Ambulation/Gait Yes   Ambulation/Gait Assistance 7: Independent   Ambulation Distance (Feet) 600 Feet   Assistive device None   Gait Pattern Step-through pattern;Decreased dorsiflexion - right;Poor foot clearance - right;Trunk flexed   Ambulation Surface Unlevel;Indoor;Grass;Gravel             Balance Exercises - 04/24/16 1056      Balance Exercises: Standing   SLS Eyes open;Solid surface;3 reps;Time  able to stand on one LE but < 3 seconds with each.   Stepping Strategy Anterior;Posterior;Foam/compliant surface  intermittent light UE support   Step Over Hurdles / Cones Alternate toe tapping standing on compliant surface.  intermittent UE support.             PT Short Term Goals - 04/03/16 1440      PT  SHORT TERM GOAL #1   Title Pt will perform HEP independenlty for improved strength, balance and gait.  TARGET 03/23/16   Baseline Per progress note on 03/27/16   Time 4   Period Weeks   Status Partially Met     PT SHORT TERM GOAL #2   Title Pt will improve Berg Score to at least 49/56 for decreased fall risk.   Baseline per progress note. 49/56.  03/27/16   Time 4   Period Weeks   Status Achieved     PT SHORT TERM GOAL #3   Title Pt will improve TUG score to less than or equal to 13.5 seconds for decreased fall risk.   Baseline Per progress note, 8.84 seconds no AD. 03/27/16   Time 4   Period Weeks   Status Achieved     PT SHORT TERM GOAL #4   Title Pt will amb. 300' over even/uneven terrain with LRAD at MOD I level.   Baseline per progress not 03/27/16   Status Achieved           PT Long Term Goals - 04/24/16 1112      PT LONG TERM GOAL #1   Title Pt will verbalize understanding of fall prevention in the home environment.  TARGET 04/22/16   Baseline met; 04/21/16   Time 8   Period Weeks   Status Achieved     PT LONG TERM GOAL #2   Title Pt will improve Dynamic Gait Index score to at least 19/24 for decreased fall risk.   Baseline 23/24; 04/21/16   Time 8   Period Weeks   Status Achieved     PT LONG TERM GOAL #3   Title Pt will improve single limb stance to at least 3 seconds each leg for improved negotiation of steps and obstacles.   Baseline Not met. Able to SLS but for < 3 seconds each. 04/24/16   Time 8   Period Weeks   Status Not Met     PT LONG TERM GOAL #4   Title Pt will amb. 600' over even/uneven terrain IND without AD to improve functional moblity.    Baseline met. 04/24/16   Time 8   Period Weeks   Status Achieved     PT LONG TERM GOAL #5   Title Pt will ascend/descent 12 steps with one handrail at MOD I level to negotiate steps at home.   Baseline Met; 04/21/16   Time 8   Period Weeks   Status Achieved     PT LONG TERM GOAL #6   Title Pt will  improve SOT composite score to WNL  for his age (approx. 68) to improve balance and safety. Target date: 02/08/16   Baseline did not assess on last visit.   Status Unable to assess               Plan - 04/24/16 1114    Clinical Impression Statement Pt met all LTGs assessed except SLS goal although pt is able to step over and onto obstacles well.  Pt is agreeable to discharge today reporting significant improvement with mobility.   Rehab Potential Good   Clinical Impairments Affecting Rehab Potential co-morbidities   PT Frequency 2x / week   PT Duration 8 weeks   PT Treatment/Interventions ADLs/Self Care Home Management;Therapeutic exercise;Therapeutic activities;Functional mobility training;Gait training;Balance training;Neuromuscular re-education;Patient/family education   PT Next Visit Plan send d/c summary to MD. Could a copy be sent to pt's PCP? per pt's wife request.   PT Home Exercise Plan Strengthening and balance HEP on days pt does not have dialysis.   Consulted and Agree with Plan of Care Patient      Patient will benefit from skilled therapeutic intervention in order to improve the following deficits and impairments:  Abnormal gait, Decreased balance, Decreased mobility, Decreased strength, Difficulty walking, Postural dysfunction  Visit Diagnosis: Other abnormalities of gait and mobility  Unsteadiness on feet  Unspecified lack of coordination     Problem List Patient Active Problem List   Diagnosis Date Noted  . Hemiparesis affecting right side as late effect of cerebrovascular accident ([place]) 02/28/2016  . Aphasia, late effect of cerebrovascular disease 02/28/2016  . Acute encephalopathy 01/08/2016  . Seizures ([employer]) 01/08/2016  . History of ischemic left MCA stroke 01/08/2016  . Right sided weakness 01/08/2016  . Chronic combined systolic and diastolic CHF (congestive heart failure) ([place]) 01/08/2016  . Atrial fibrillation ([place]) 01/08/2016  . Leukocytosis  01/08/2016  . Benign essential HTN 01/08/2016  . Anemia of chronic disease 01/08/2016  . Controlled type 2 diabetes mellitus with diabetic nephropathy, without long-term current use of insulin ([place]) 01/08/2016  . History of DVT (deep vein thrombosis) 01/08/2016  . ESRD (end stage renal disease) on dialysis [name])     Bjorn Loser, PTA  04/24/16, 11:24 AM [facility] [address] [place], [place], [address] Phone: [phone]   Fax:  [phone]  Name: Isaiah [name] [facility] MRN: [id number] Date of Birth: 1966/02/10

## 2016-04-25 ENCOUNTER — Telehealth: Payer: Self-pay

## 2016-04-25 DIAGNOSIS — D509 Iron deficiency anemia, unspecified: Secondary | ICD-10-CM | POA: Diagnosis not present

## 2016-04-25 DIAGNOSIS — T8249XA Other complication of vascular dialysis catheter, initial encounter: Secondary | ICD-10-CM | POA: Diagnosis not present

## 2016-04-25 DIAGNOSIS — D688 Other specified coagulation defects: Secondary | ICD-10-CM | POA: Diagnosis not present

## 2016-04-25 DIAGNOSIS — N186 End stage renal disease: Secondary | ICD-10-CM | POA: Diagnosis not present

## 2016-04-25 DIAGNOSIS — E1129 Type 2 diabetes mellitus with other diabetic kidney complication: Secondary | ICD-10-CM | POA: Diagnosis not present

## 2016-04-25 NOTE — Telephone Encounter (Signed)
Pt seen on 04/08/16-Dr. Kozlow. Wife is calling to get the results of the ox o meter test.  Please Advise

## 2016-04-25 NOTE — Telephone Encounter (Signed)
Pt contacted regarding oximetry report. Per Dr. Neldon Mc, test was great and he probably does not need the C-PAP machine. Pt. Advised.

## 2016-04-25 NOTE — Telephone Encounter (Signed)
Spoke with pts wife and informed her of what the results was.

## 2016-04-28 DIAGNOSIS — T8249XA Other complication of vascular dialysis catheter, initial encounter: Secondary | ICD-10-CM | POA: Diagnosis not present

## 2016-04-28 DIAGNOSIS — Z7901 Long term (current) use of anticoagulants: Secondary | ICD-10-CM | POA: Diagnosis not present

## 2016-04-28 DIAGNOSIS — D509 Iron deficiency anemia, unspecified: Secondary | ICD-10-CM | POA: Diagnosis not present

## 2016-04-28 DIAGNOSIS — D688 Other specified coagulation defects: Secondary | ICD-10-CM | POA: Diagnosis not present

## 2016-04-28 DIAGNOSIS — E1129 Type 2 diabetes mellitus with other diabetic kidney complication: Secondary | ICD-10-CM | POA: Diagnosis not present

## 2016-04-28 DIAGNOSIS — N186 End stage renal disease: Secondary | ICD-10-CM | POA: Diagnosis not present

## 2016-04-28 DIAGNOSIS — D631 Anemia in chronic kidney disease: Secondary | ICD-10-CM | POA: Diagnosis not present

## 2016-04-29 ENCOUNTER — Ambulatory Visit: Payer: 59 | Admitting: Physical Medicine & Rehabilitation

## 2016-04-29 DIAGNOSIS — G40309 Generalized idiopathic epilepsy and epileptic syndromes, not intractable, without status epilepticus: Secondary | ICD-10-CM | POA: Diagnosis not present

## 2016-04-29 DIAGNOSIS — Z7901 Long term (current) use of anticoagulants: Secondary | ICD-10-CM | POA: Diagnosis not present

## 2016-04-29 DIAGNOSIS — I639 Cerebral infarction, unspecified: Secondary | ICD-10-CM | POA: Diagnosis not present

## 2016-04-29 DIAGNOSIS — E1165 Type 2 diabetes mellitus with hyperglycemia: Secondary | ICD-10-CM | POA: Diagnosis not present

## 2016-04-29 DIAGNOSIS — I6789 Other cerebrovascular disease: Secondary | ICD-10-CM | POA: Diagnosis not present

## 2016-04-30 DIAGNOSIS — D688 Other specified coagulation defects: Secondary | ICD-10-CM | POA: Diagnosis not present

## 2016-04-30 DIAGNOSIS — D509 Iron deficiency anemia, unspecified: Secondary | ICD-10-CM | POA: Diagnosis not present

## 2016-04-30 DIAGNOSIS — E1129 Type 2 diabetes mellitus with other diabetic kidney complication: Secondary | ICD-10-CM | POA: Diagnosis not present

## 2016-04-30 DIAGNOSIS — N186 End stage renal disease: Secondary | ICD-10-CM | POA: Diagnosis not present

## 2016-04-30 DIAGNOSIS — D631 Anemia in chronic kidney disease: Secondary | ICD-10-CM | POA: Diagnosis not present

## 2016-04-30 DIAGNOSIS — T8249XA Other complication of vascular dialysis catheter, initial encounter: Secondary | ICD-10-CM | POA: Diagnosis not present

## 2016-04-30 DIAGNOSIS — Z7901 Long term (current) use of anticoagulants: Secondary | ICD-10-CM | POA: Diagnosis not present

## 2016-05-01 ENCOUNTER — Ambulatory Visit: Payer: 59 | Admitting: Occupational Therapy

## 2016-05-01 ENCOUNTER — Encounter: Payer: Self-pay | Admitting: Occupational Therapy

## 2016-05-01 DIAGNOSIS — R482 Apraxia: Secondary | ICD-10-CM | POA: Diagnosis not present

## 2016-05-01 DIAGNOSIS — M6281 Muscle weakness (generalized): Secondary | ICD-10-CM

## 2016-05-01 DIAGNOSIS — I69351 Hemiplegia and hemiparesis following cerebral infarction affecting right dominant side: Secondary | ICD-10-CM

## 2016-05-01 DIAGNOSIS — R41841 Cognitive communication deficit: Secondary | ICD-10-CM | POA: Diagnosis not present

## 2016-05-01 DIAGNOSIS — I69315 Cognitive social or emotional deficit following cerebral infarction: Secondary | ICD-10-CM | POA: Diagnosis not present

## 2016-05-01 DIAGNOSIS — R279 Unspecified lack of coordination: Secondary | ICD-10-CM | POA: Diagnosis not present

## 2016-05-01 DIAGNOSIS — R278 Other lack of coordination: Secondary | ICD-10-CM | POA: Diagnosis not present

## 2016-05-01 DIAGNOSIS — R2689 Other abnormalities of gait and mobility: Secondary | ICD-10-CM | POA: Diagnosis not present

## 2016-05-01 DIAGNOSIS — R2681 Unsteadiness on feet: Secondary | ICD-10-CM | POA: Diagnosis not present

## 2016-05-01 NOTE — Therapy (Signed)
[facility] [address] [place], [place], [address] Phone: [phone]   Fax:  [phone]  Occupational Therapy Treatment  Patient Details  Name: Isaiah [name] [facility] MRN: [id number] Date of Birth: 02-05-1966 Referring Provider: Alysia Penna  Encounter Date: 05/01/2016      OT End of Session - 05/01/16 1231    Visit Number 9   Number of Visits 17   Date for OT Re-Evaluation 05/01/16   Authorization Type medicare   Authorization Time Period "$20 co pay, no visit limit, no auth required"   Authorization - Visit Number 9   Authorization - Number of Visits 10   OT Start Time 1018   OT Stop Time 1101   OT Time Calculation (min) 43 min   Activity Tolerance Patient tolerated treatment well      Past Medical History:  Diagnosis Date  . A-fib ([place])   . Acute renal failure ([place]) 07/2011  . Anemia   . Asthma   . Diabetes mellitus without complication ([place])   . ESRD (end stage renal disease) on dialysis ([place])   . Gout   . History of recent blood transfusion 10/27/14   2 Units PRBC's  . Hyperkalemia 07/2011  . Hypertension   . Monoclonal gammopathies   . Monoclonal gammopathy   . OSA on CPAP   . Pulmonary embolism ([place]) 07/2011   a. Tx with Coumadin for 6 months (unknown cause per patient).  . Pulmonary embolism ([place]) 07/2011; 09/27/2014   a. Bilat PE 07/2011 - unclear cause, tx with 6 months Coumadin.;   . Seizures ([place])   . Sickle cell-thalassemia disease ([place])    a. Sickle cell trait.  . Sleep apnea    a. uses CPAP.  [name] Stroke [facility])     Past Surgical History:  Procedure Laterality Date  . AV FISTULA PLACEMENT Right 12/05/2015   Procedure: INSERTION OF ARTERIOVENOUS (AV) GORE-TEX GRAFT ARM;  Surgeon: Elam Dutch, MD;  Location: [facility];  Service: Vascular;  Laterality: Right;  . BONE MARROW BIOPSY    . CHOLECYSTECTOMY    . CHOLECYSTECTOMY  1990's?  . EYE SURGERY Right   . INSERTION OF DIALYSIS CATHETER  Right 10/16/2015   Procedure: INSERTION OF PALINDROME DIALYSIS CATHETER ;  Surgeon: Elam Dutch, MD;  Location: [facility];  Service: Vascular;  Laterality: Right;  . OTHER SURGICAL HISTORY     Retinal surgery  . PARS PLANA VITRECTOMY  02/17/2012   Procedure: PARS PLANA VITRECTOMY WITH 25 GAUGE;  Surgeon: Hayden Pedro, MD;  Location: [facility];  Service: Ophthalmology;  Laterality: Right;  . PERIPHERAL VASCULAR CATHETERIZATION N/A 03/20/2016   Procedure: A/V Shuntogram/Fistulagram;  Surgeon: Conrad Tenkiller, MD;  Location: [facility] CV LAB;  Service: Cardiovascular;  Laterality: N/A;    There were no vitals filed for this visit.      Subjective Assessment - 05/01/16 1023    Subjective  I am only doing ST and OT now   Patient is accompained by: Family member  wife   Pertinent History see Epic    Patient Stated Goals to use right hand better   Currently in Pain? No/denies                      OT Treatments/Exercises (OP) - 05/01/16 0001      ADLs   ADL Comments Checked short term goals.  See goal section for updates.  Pt has regressed in terms of strength  in L hand - 10 pounds on eval however today only 5 pounds.  Pt has had fistula placed since stroke and has had numerous issues with fistula.  Medical team continues to complete procedures on R forearm and pt with edema in R forearm as well as some pain .  Feel this impacting grip strength in R hand.  Also feel dynamometer may not accurate reflect functional strength as pt can use R hand in tasks requiring more than 5 pounds when cued to look at hand and cued to truly use his R hand vs letting his left hand do the work.  Swelling in R hand is improved.      Neurological Re-education Exercises   Other Exercises 1 Addressing mid reach with manipulation tasks - pt has fair amount of mobiity in fingers on R hand however when given functional task does not engage hand to full potential . Pt needs max repetitive cues to look at hand to  compensate for sensory impairment as well as to aid in motor planning.  Pt also needs max cues to try and use his hand vs using substitution/adaptation to complete the task.  Pt can verbalize understanding howver has great difficulty with carry over. Will continue to reinforce.                   OT Short Term Goals - 05/01/16 1229      OT SHORT TERM GOAL #1   Title I with initiall HEP- 03/20/2016   Time 4   Period Weeks   Status Achieved     OT SHORT TERM GOAL #2   Title Pt will perform all basic ADLS with supervision.   Time 4   Period Weeks   Status Achieved     OT SHORT TERM GOAL #3   Title Patient will demonstrate improved functional strength in right UE as evidenced by brushing teeth with dominant right hand   Time 4   Period Weeks   Status Achieved     OT SHORT TERM GOAL #4   Title Pt will demonstrate ability to retrieve a light weight (less than 1 lb) object at 120 shoulder flexion with RUE.   Time 4   Period Weeks   Status Achieved  3 pound object     OT SHORT TERM GOAL #5   Title Patient will demostrate improved coordination for right hand as evidenced by 15 on box and blocks test   Time 4   Period Weeks   Status Achieved  25 on 03/12/2016     OT SHORT TERM GOAL #6   Title Pt will report feeding himself with RUE 75% of the time.   Time 4   Period Weeks   Status Achieved           OT Long Term Goals - 05/01/16 1229      OT LONG TERM GOAL #1   Title I with updated HEP.- 04/17/2016   Time 8   Period Weeks   Status On-going     OT LONG TERM GOAL #2   Title Pt will demonstrate ability to write his name legibily.   Time 8   Period Weeks   Status On-going     OT LONG TERM GOAL #3   Title Patient will demonstrate adequate strength and coordination in dominant right hand to tighten laces and tie shoes using bilateral technique   Time 8   Period Weeks   Status Achieved     OT  LONG TERM GOAL #4   Title Assess 9 hole peg test and determine goal    Status Achieved  pt unable to do 1 in 1 minute time      OT LONG TERM GOAL #5   Title Pt will demonstrate RUE grip strength of 20+ lbs for increased functional use.   Time 8   Period Weeks   Status On-going  12 pounds on 03/18/2016 using 3rd notch     OT LONG TERM GOAL #6   Title Pt will perform all basic ADLs and simple meal prep modified independently.   Time 8   Period Weeks   Status Achieved               Plan - 05/01/16 1230    Clinical Impression Statement Pt with slow progress toward goals. Pt needs additional time and repetition to maximize potential.   Rehab Potential Good   Clinical Impairments Affecting Rehab Potential aphasia, L inattention, cognitive impairmen   OT Frequency 2x / week   OT Duration 4 weeks   OT Treatment/Interventions Self-care/ADL training;Therapeutic exercise;Patient/family education;Balance training;Splinting;Manual Therapy;Neuromuscular education;Ultrasound;Therapeutic exercises;Therapeutic activities;DME and/or AE instruction;Parrafin;Cryotherapy;Electrical Stimulation;Fluidtherapy;Scar mobilization;Cognitive remediation/compensation;Moist Heat;Contrast Bath;Passive range of motion;Visual/perceptual remediation/compensation;Functional Mobility Training   Plan NMR for RUE, continue to focus on in hand manipulation with functional tasks, using RUE in functional tasks, vision as compensation    Consulted and Agree with Plan of Care Patient      Patient will benefit from skilled therapeutic intervention in order to improve the following deficits and impairments:  Cardiopulmonary status limiting activity, Decreased activity tolerance, Decreased balance, Decreased cognition, Decreased endurance, Decreased coordination, Decreased strength, Increased edema, Impaired tone, Impaired UE functional use, Impaired vision/preception, Increased fascial restricitons  Visit Diagnosis: Other lack of coordination - Plan: Ot plan of care cert/re-cert  Apraxia  - Plan: Ot plan of care cert/re-cert  Muscle weakness (generalized) - Plan: Ot plan of care cert/re-cert  Hemiplegia and hemiparesis following cerebral infarction affecting right dominant side ([place]) - Plan: Ot plan of care cert/re-cert  Cognitive social or emotional deficit following cerebral infarction - Plan: Ot plan of care cert/re-cert    Problem List Patient Active Problem List   Diagnosis Date Noted  . Hemiparesis affecting right side as late effect of cerebrovascular accident ([place]) 02/28/2016  . Aphasia, late effect of cerebrovascular disease 02/28/2016  . Acute encephalopathy 01/08/2016  . Seizures ([place]) 01/08/2016  . History of ischemic left MCA stroke 01/08/2016  . Right sided weakness 01/08/2016  . Chronic combined systolic and diastolic CHF (congestive heart failure) ([place]) 01/08/2016  . Atrial fibrillation ([place]) 01/08/2016  . Leukocytosis 01/08/2016  . Benign essential HTN 01/08/2016  . Anemia of chronic disease 01/08/2016  . Controlled type 2 diabetes mellitus with diabetic nephropathy, without long-term current use of insulin ([place]) 01/08/2016  . History of DVT (deep vein thrombosis) 01/08/2016  . ESRD (end stage renal disease) on dialysis [facility])     Quay Burow, OTR/L 05/01/2016, 12:36 PM  [facility] [address] [place], [place], [address] Phone: [phone]   Fax:  [phone]  Name: Isaiah [name] [facility] MRN: [id number] Date of Birth: 1966-08-01

## 2016-05-02 DIAGNOSIS — D631 Anemia in chronic kidney disease: Secondary | ICD-10-CM | POA: Diagnosis not present

## 2016-05-02 DIAGNOSIS — N186 End stage renal disease: Secondary | ICD-10-CM | POA: Diagnosis not present

## 2016-05-02 DIAGNOSIS — D688 Other specified coagulation defects: Secondary | ICD-10-CM | POA: Diagnosis not present

## 2016-05-02 DIAGNOSIS — D509 Iron deficiency anemia, unspecified: Secondary | ICD-10-CM | POA: Diagnosis not present

## 2016-05-02 DIAGNOSIS — E1129 Type 2 diabetes mellitus with other diabetic kidney complication: Secondary | ICD-10-CM | POA: Diagnosis not present

## 2016-05-02 DIAGNOSIS — T8249XA Other complication of vascular dialysis catheter, initial encounter: Secondary | ICD-10-CM | POA: Diagnosis not present

## 2016-05-02 DIAGNOSIS — Z7901 Long term (current) use of anticoagulants: Secondary | ICD-10-CM | POA: Diagnosis not present

## 2016-05-05 ENCOUNTER — Ambulatory Visit: Payer: 59 | Admitting: Occupational Therapy

## 2016-05-05 ENCOUNTER — Encounter: Payer: Self-pay | Admitting: Occupational Therapy

## 2016-05-05 DIAGNOSIS — T8249XA Other complication of vascular dialysis catheter, initial encounter: Secondary | ICD-10-CM | POA: Diagnosis not present

## 2016-05-05 DIAGNOSIS — R482 Apraxia: Secondary | ICD-10-CM | POA: Diagnosis not present

## 2016-05-05 DIAGNOSIS — D631 Anemia in chronic kidney disease: Secondary | ICD-10-CM | POA: Diagnosis not present

## 2016-05-05 DIAGNOSIS — E1129 Type 2 diabetes mellitus with other diabetic kidney complication: Secondary | ICD-10-CM | POA: Diagnosis not present

## 2016-05-05 DIAGNOSIS — R279 Unspecified lack of coordination: Secondary | ICD-10-CM | POA: Diagnosis not present

## 2016-05-05 DIAGNOSIS — N186 End stage renal disease: Secondary | ICD-10-CM | POA: Diagnosis not present

## 2016-05-05 DIAGNOSIS — R2681 Unsteadiness on feet: Secondary | ICD-10-CM | POA: Diagnosis not present

## 2016-05-05 DIAGNOSIS — D688 Other specified coagulation defects: Secondary | ICD-10-CM | POA: Diagnosis not present

## 2016-05-05 DIAGNOSIS — R278 Other lack of coordination: Secondary | ICD-10-CM | POA: Diagnosis not present

## 2016-05-05 DIAGNOSIS — I69315 Cognitive social or emotional deficit following cerebral infarction: Secondary | ICD-10-CM | POA: Diagnosis not present

## 2016-05-05 DIAGNOSIS — R2689 Other abnormalities of gait and mobility: Secondary | ICD-10-CM | POA: Diagnosis not present

## 2016-05-05 DIAGNOSIS — M6281 Muscle weakness (generalized): Secondary | ICD-10-CM

## 2016-05-05 DIAGNOSIS — I69351 Hemiplegia and hemiparesis following cerebral infarction affecting right dominant side: Secondary | ICD-10-CM | POA: Diagnosis not present

## 2016-05-05 DIAGNOSIS — R41841 Cognitive communication deficit: Secondary | ICD-10-CM | POA: Diagnosis not present

## 2016-05-05 MED FILL — VIMPAT 100 MG TABLET: 100 | 90 days supply | Qty: 180 | Fill #1

## 2016-05-05 MED FILL — PANTOPRAZOLE SOD DR 40 MG T: 40 | 90 days supply | Qty: 90 | Fill #0

## 2016-05-05 NOTE — Patient Instructions (Addendum)
1. Grip Strengthening (Resistive Putty)   DO THESE EXERCISES 2 TIMES PER DAY EVERY SINGLE DAY.  THE  MORE EFFORT YOU PUT INTO THE MORE STRENGTH YOUR HAND WILL GAIN!!!   ROLL PUTTY INTO FAT SHORT HOT DOG.  Make sure when you pick it up you have it all the way in your hand and all fingers on the putty. Squeeze putty using thumb and all fingers AS HARD AS YOU CAN. SQUEEZE FOR AT LEAST A SLOW COUNT OF 5. Repeat _20___ times. Do __2__ sessions per day.   2. Roll putty into tube on table and pinch along the tube using your thumb, index and middle finger. Pinch as hard as you can. Do this 10 times.      Copyright  VHI. All rights reserved.

## 2016-05-05 NOTE — Therapy (Signed)
[facility] [address] [place], [place], [address] Phone: [phone]   Fax:  [phone]  Occupational Therapy Treatment  Patient Details  Name: Isaiah [name] [facility] MRN: [id number] Date of Birth: 03-06-1966 Referring Provider: Alysia Penna  Encounter Date: 05/05/2016      OT End of Session - 05/05/16 0903    Visit Number 10   Number of Visits 17   Date for OT Re-Evaluation 06/26/16   Authorization Type medicare   Authorization Time Period "$20 co pay, no visit limit, no auth required"   Authorization - Visit Number 10   Authorization - Number of Visits 10   OT Start Time 0801   OT Stop Time 0846   OT Time Calculation (min) 45 min   Activity Tolerance Patient tolerated treatment well      Past Medical History:  Diagnosis Date  . A-fib ([place])   . Acute renal failure ([place]) 07/2011  . Anemia   . Asthma   . Diabetes mellitus without complication ([place])   . ESRD (end stage renal disease) on dialysis ([place])   . Gout   . History of recent blood transfusion 10/27/14   2 Units PRBC's  . Hyperkalemia 07/2011  . Hypertension   . Monoclonal gammopathies   . Monoclonal gammopathy   . OSA on CPAP   . Pulmonary embolism ([place]) 07/2011   a. Tx with Coumadin for 6 months (unknown cause per patient).  . Pulmonary embolism ([place]) 07/2011; 09/27/2014   a. Bilat PE 07/2011 - unclear cause, tx with 6 months Coumadin.;   . Seizures ([place])   . Sickle cell-thalassemia disease ([place])    a. Sickle cell trait.  . Sleep apnea    a. uses CPAP.  [name] Stroke [facility])     Past Surgical History:  Procedure Laterality Date  . AV FISTULA PLACEMENT Right 12/05/2015   Procedure: INSERTION OF ARTERIOVENOUS (AV) GORE-TEX GRAFT ARM;  Surgeon: Elam Dutch, MD;  Location: [facility];  Service: Vascular;  Laterality: Right;  . BONE MARROW BIOPSY    . CHOLECYSTECTOMY    . CHOLECYSTECTOMY  1990's?  . EYE SURGERY Right   . INSERTION OF DIALYSIS  CATHETER Right 10/16/2015   Procedure: INSERTION OF PALINDROME DIALYSIS CATHETER ;  Surgeon: Elam Dutch, MD;  Location: [facility];  Service: Vascular;  Laterality: Right;  . OTHER SURGICAL HISTORY     Retinal surgery  . PARS PLANA VITRECTOMY  02/17/2012   Procedure: PARS PLANA VITRECTOMY WITH 25 GAUGE;  Surgeon: Hayden Pedro, MD;  Location: [facility];  Service: Ophthalmology;  Laterality: Right;  . PERIPHERAL VASCULAR CATHETERIZATION N/A 03/20/2016   Procedure: A/V Shuntogram/Fistulagram;  Surgeon: Conrad Rosemont, MD;  Location: [place];  Service: Cardiovascular;  Laterality: N/A;    There were no vitals filed for this visit.      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 No/denies                      OT Treatments/Exercises (OP) - 05/05/16 0001      Exercises   Exercises Hand     Hand Exercises   Theraputty Roll;Grip;Pinch;Locate Pegs   Theraputty - Flatten Given recent decling in grip strength issued yellow putty today.  When asked  what pt was doing at home he was unable to demonstrate and stated he had red and green putty only.  Reviwed HEP again with pt (have reviewed with pt multiple times) - pt needs demonstation, repetition and cues to use hand to full potential due to R inattention and cognitive deficits.  Pt does not come to therapy with family member as wife works.  WIth max cues pt is able to manipulate yellow putty and remove pegs, manipulate in hand and place in peg board today.         Pt with decreased grip strength in R hand (now only 5 pounds) - feel this is at least partially due to trauma and complications of  Fistula placed after CVA. Pt has had numerous procedures in R forearm due to complications. Pt in agreement.         OT Education - 05/05/16 0901    Education provided Yes    Education Details issued yellow putty and reviewed HEP again with full reps   Person(s) Educated Patient   Methods Explanation;Demonstration;Verbal cues;Handout   Comprehension Verbalized understanding;Returned demonstration;Need further instruction  with structure          OT Short Term Goals - 05/05/16 0901      OT SHORT TERM GOAL #1   Title I with initiall HEP- 03/20/2016   Time 4   Period Weeks   Status Achieved     OT SHORT TERM GOAL #2   Title Pt will perform all basic ADLS with supervision.   Time 4   Period Weeks   Status Achieved     OT SHORT TERM GOAL #3   Title Patient will demonstrate improved functional strength in right UE as evidenced by brushing teeth with dominant right hand   Time 4   Period Weeks   Status Achieved     OT SHORT TERM GOAL #4   Title Pt will demonstrate ability to retrieve a light weight (less than 1 lb) object at 120 shoulder flexion with RUE.   Time 4   Period Weeks   Status Achieved  3 pound object     OT SHORT TERM GOAL #5   Title Patient will demostrate improved coordination for right hand as evidenced by 15 on box and blocks test   Time 4   Period Weeks   Status Achieved  25 on 03/12/2016     OT SHORT TERM GOAL #6   Title Pt will report feeding himself with RUE 75% of the time.   Time 4   Period Weeks   Status Achieved           OT Long Term Goals - 05/05/16 0901      OT LONG TERM GOAL #1   Title I with updated HEP.- 05/29/2016   Time 8   Period Weeks   Status On-going     OT LONG TERM GOAL #2   Title Pt will demonstrate ability to write his name legibily.   Time 8   Period Weeks   Status On-going     OT LONG TERM GOAL #3   Title Patient will demonstrate adequate strength and coordination in dominant right hand to tighten laces and tie shoes using bilateral technique   Time 8   Period Weeks   Status Achieved     OT LONG TERM GOAL #4   Title Assess 9 hole peg test and determine goal   Status Achieved  pt  unable to do 1 in 1  minute time      OT LONG TERM GOAL #5   Title Pt will demonstrate RUE grip strength of 20+ lbs for increased functional use.   Time 8   Period Weeks   Status On-going  12 pounds on 03/18/2016 using 3rd notch     OT LONG TERM GOAL #6   Title Pt will perform all basic ADLs and simple meal prep modified independently.   Time 8   Period Weeks   Status Achieved               Plan - 05-07-2016 0902    Clinical Impression Statement Pt with slow progress toward goals. Pt requires structure, max cues and repetition to use R hand fully   Rehab Potential Good   Clinical Impairments Affecting Rehab Potential aphasia, L inattention, cognitive impairmen   OT Frequency 2x / week   OT Duration 4 weeks   OT Treatment/Interventions Self-care/ADL training;Therapeutic exercise;Patient/family education;Balance training;Splinting;Manual Therapy;Neuromuscular education;Ultrasound;Therapeutic exercises;Therapeutic activities;DME and/or AE instruction;Parrafin;Cryotherapy;Electrical Stimulation;Fluidtherapy;Scar mobilization;Cognitive remediation/compensation;Moist Heat;Contrast Bath;Passive range of motion;Visual/perceptual remediation/compensation;Functional Mobility Training   Plan review HEP for putty again, in hand manipulation tasks, grasp and release, functional tasks   Consulted and Agree with Plan of Care Patient      Patient will benefit from skilled therapeutic intervention in order to improve the following deficits and impairments:  Cardiopulmonary status limiting activity, Decreased activity tolerance, Decreased balance, Decreased cognition, Decreased endurance, Decreased coordination, Decreased strength, Increased edema, Impaired tone, Impaired UE functional use, Impaired vision/preception, Increased fascial restricitons  Visit Diagnosis: Other lack of coordination  Apraxia  Muscle weakness (generalized)  Hemiplegia and hemiparesis following cerebral infarction  affecting right dominant side (HCC)  Cognitive social or emotional deficit following cerebral infarction      G-Codes - May 07, 2016 0905    Functional Assessment Tool Used skilled clinical judgement, box and blocks   Functional Limitation Carrying, moving and handling objects   Carrying, Moving and Handling Objects Current Status (H8299) At least 40 percent but less than 60 percent impaired, limited or restricted   Carrying, Moving and Handling Objects Goal Status (B7169) At least 20 percent but less than 40 percent impaired, limited or restricted      Problem List Patient Active Problem List   Diagnosis Date Noted  . Hemiparesis affecting right side as late effect of cerebrovascular accident ([place]) 02/28/2016  . Aphasia, late effect of cerebrovascular disease 02/28/2016  . Acute encephalopathy 01/08/2016  . Seizures ([place]) 01/08/2016  . History of ischemic left MCA stroke 01/08/2016  . Right sided weakness 01/08/2016  . Chronic combined systolic and diastolic CHF (congestive heart failure) ([place]) 01/08/2016  . Atrial fibrillation ([place]) 01/08/2016  . Leukocytosis 01/08/2016  . Benign essential HTN 01/08/2016  . Anemia of chronic disease 01/08/2016  . Controlled type 2 diabetes mellitus with diabetic nephropathy, without long-term current use of insulin ([place]) 01/08/2016  . History of DVT (deep vein thrombosis) 01/08/2016  . ESRD (end stage renal disease) on dialysis [facility])    Occupational Therapy Progress Note  Dates of Reporting Period: 02/21/2016 to May 07, 2016  Objective Reports of Subjective Statement: See above  Objective Measurements: See above  Goal Update: See above  Plan: See above  Reason Skilled Services are Required: See above  Quay Burow, OTR/L 05/07/2016, [facility] [address] [place] [name], [place], [address] Phone: [phone]   Fax:  [phone]  Name: Isaiah [name] [facility] MRN:  [id number] Date of Birth: March 12, 1966

## 2016-05-06 MED FILL — UNIFINE PENTIPS 8MM 31G: 31G X 8 MM | 90 days supply | Qty: 100 | Fill #1

## 2016-05-07 ENCOUNTER — Ambulatory Visit: Payer: 59 | Admitting: Occupational Therapy

## 2016-05-07 ENCOUNTER — Encounter: Payer: Self-pay | Admitting: Occupational Therapy

## 2016-05-07 DIAGNOSIS — R2689 Other abnormalities of gait and mobility: Secondary | ICD-10-CM | POA: Diagnosis not present

## 2016-05-07 DIAGNOSIS — E1129 Type 2 diabetes mellitus with other diabetic kidney complication: Secondary | ICD-10-CM | POA: Diagnosis not present

## 2016-05-07 DIAGNOSIS — D631 Anemia in chronic kidney disease: Secondary | ICD-10-CM | POA: Diagnosis not present

## 2016-05-07 DIAGNOSIS — D688 Other specified coagulation defects: Secondary | ICD-10-CM | POA: Diagnosis not present

## 2016-05-07 DIAGNOSIS — M6281 Muscle weakness (generalized): Secondary | ICD-10-CM | POA: Diagnosis not present

## 2016-05-07 DIAGNOSIS — I69315 Cognitive social or emotional deficit following cerebral infarction: Secondary | ICD-10-CM

## 2016-05-07 DIAGNOSIS — R482 Apraxia: Secondary | ICD-10-CM | POA: Diagnosis not present

## 2016-05-07 DIAGNOSIS — R2681 Unsteadiness on feet: Secondary | ICD-10-CM | POA: Diagnosis not present

## 2016-05-07 DIAGNOSIS — I69351 Hemiplegia and hemiparesis following cerebral infarction affecting right dominant side: Secondary | ICD-10-CM | POA: Diagnosis not present

## 2016-05-07 DIAGNOSIS — N186 End stage renal disease: Secondary | ICD-10-CM | POA: Diagnosis not present

## 2016-05-07 DIAGNOSIS — R279 Unspecified lack of coordination: Secondary | ICD-10-CM | POA: Diagnosis not present

## 2016-05-07 DIAGNOSIS — R278 Other lack of coordination: Secondary | ICD-10-CM | POA: Diagnosis not present

## 2016-05-07 DIAGNOSIS — R41841 Cognitive communication deficit: Secondary | ICD-10-CM | POA: Diagnosis not present

## 2016-05-07 DIAGNOSIS — T8249XA Other complication of vascular dialysis catheter, initial encounter: Secondary | ICD-10-CM | POA: Diagnosis not present

## 2016-05-07 MED FILL — CALCIUM ACETATE 667 MG CAP: 667 | 30 days supply | Qty: 180 | Fill #1

## 2016-05-07 NOTE — Therapy (Signed)
[facility] [address] [place], [place], [address] Phone: [phone]   Fax:  [phone]  Occupational Therapy Treatment  Patient Details  Name: Isaiah [name] [facility] MRN: [id number] Date of Birth: [birth year] Referring Provider: Alysia Penna  Encounter Date: 05/07/2016      OT End of Session - 05/07/16 1333    OT Start Time 1105   OT Stop Time 1148   OT Time Calculation (min) 43 min   Activity Tolerance Patient tolerated treatment well   Behavior During Therapy [facility] for tasks assessed/performed      Past Medical History:  Diagnosis Date  . A-fib ([place])   . Acute renal failure ([place]) 07/2011  . Anemia   . Asthma   . Diabetes mellitus without complication ([place])   . ESRD (end stage renal disease) on dialysis ([place])   . Gout   . History of recent blood transfusion 10/27/14   2 Units PRBC's  . Hyperkalemia 07/2011  . Hypertension   . Monoclonal gammopathies   . Monoclonal gammopathy   . OSA on CPAP   . Pulmonary embolism ([place]) 07/2011   a. Tx with Coumadin for 6 months (unknown cause per patient).  . Pulmonary embolism ([place]) 07/2011; 09/27/2014   a. Bilat PE 07/2011 - unclear cause, tx with 6 months Coumadin.;   . Seizures ([place])   . Sickle cell-thalassemia disease ([place])    a. Sickle cell trait.  . Sleep apnea    a. uses CPAP.  [name] Stroke [facility])     Past Surgical History:  Procedure Laterality Date  . AV FISTULA PLACEMENT Right 12/05/2015   Procedure: INSERTION OF ARTERIOVENOUS (AV) GORE-TEX GRAFT ARM;  Surgeon: Elam Dutch, MD;  Location: [facility];  Service: Vascular;  Laterality: Right;  . BONE MARROW BIOPSY    . CHOLECYSTECTOMY    . CHOLECYSTECTOMY  1990's?  . EYE SURGERY Right   . INSERTION OF DIALYSIS CATHETER Right 10/16/2015   Procedure: INSERTION OF PALINDROME DIALYSIS CATHETER ;  Surgeon: Elam Dutch, MD;  Location: [facility];  Service: Vascular;  Laterality: Right;  . OTHER SURGICAL HISTORY      Retinal surgery  . PARS PLANA VITRECTOMY  02/17/2012   Procedure: PARS PLANA VITRECTOMY WITH 25 GAUGE;  Surgeon: Hayden Pedro, MD;  Location: [facility];  Service: Ophthalmology;  Laterality: Right;  . PERIPHERAL VASCULAR CATHETERIZATION N/A 03/20/2016   Procedure: A/V Shuntogram/Fistulagram;  Surgeon: Conrad Lowry, MD;  Location: [facility];  Service: Cardiovascular;  Laterality: N/A;    There were no vitals filed for this visit.      Subjective Assessment - 05/07/16 1158    Subjective  Santiago Glad says the hand and fingers can take the longest.  I just want my hand better.     Pertinent History see Epic    Patient Stated Goals to use right hand better   Currently in Pain? No/denies   Pain Score 0-No pain                      OT Treatments/Exercises (OP) - 05/07/16 0001      ADLs   Writing Patient has a long term goal to write his name legibly with his right hand.  Patient is able to print his first and last name, although micrographic consistently with second name.  Patient able to write without built up grip, but unable to orient pen to paper without use of left hand.  Patient  with clearer handwriting, more consistent pressure - with built up grip with cylindrical foam.       Neurological Re-education Exercises   Other Exercises 1 Following manual therapy to hand to increase end range flexion in extension en masse, and index finger in particular, worked to increase active, volitional control of right wrist - flex/ ext, ulnar and radial deviation, and then combinations of movements.  Followed with forearm gym to isolate lower arm (forearm, wrist, hand) motion.  Patient loved this activity, and did well anticipating movements.  Patient showing improved coordiantion - interlimb.       Manual Therapy   Manual Therapy Joint mobilization   Joint Mobilization Patient with limited PIP extension - 10*, and MCP flexion - 15*.  Patient with overall tightness in right hand,  combination of active tension, and limited joint motion.                  OT Education - 05/07/16 1317    Education provided Yes   Education Details conscious relaxation of wrist and digits - to promote improved movement, decrease muscle tension   Person(s) Educated Patient   Methods Explanation;Demonstration;Verbal cues   Comprehension Need further instruction          OT Short Term Goals - 05/05/16 0901      OT SHORT TERM GOAL #1   Title I with initiall HEP- 03/20/2016   Time 4   Period Weeks   Status Achieved     OT SHORT TERM GOAL #2   Title Pt will perform all basic ADLS with supervision.   Time 4   Period Weeks   Status Achieved     OT SHORT TERM GOAL #3   Title Patient will demonstrate improved functional strength in right UE as evidenced by brushing teeth with dominant right hand   Time 4   Period Weeks   Status Achieved     OT SHORT TERM GOAL #4   Title Pt will demonstrate ability to retrieve a light weight (less than 1 lb) object at 120 shoulder flexion with RUE.   Time 4   Period Weeks   Status Achieved  3 pound object     OT SHORT TERM GOAL #5   Title Patient will demostrate improved coordination for right hand as evidenced by 15 on box and blocks test   Time 4   Period Weeks   Status Achieved  25 on 03/12/2016     OT SHORT TERM GOAL #6   Title Pt will report feeding himself with RUE 75% of the time.   Time 4   Period Weeks   Status Achieved           OT Long Term Goals - 05/05/16 0901      OT LONG TERM GOAL #1   Title I with updated HEP.- 05/29/2016   Time 8   Period Weeks   Status On-going     OT LONG TERM GOAL #2   Title Pt will demonstrate ability to write his name legibily.   Time 8   Period Weeks   Status On-going     OT LONG TERM GOAL #3   Title Patient will demonstrate adequate strength and coordination in dominant right hand to tighten laces and tie shoes using bilateral technique   Time 8   Period Weeks   Status  Achieved     OT LONG TERM GOAL #4   Title Assess 9 hole peg test and determine goal  Status Achieved  pt unable to do 1 in 1 minute time      OT LONG TERM GOAL #5   Title Pt will demonstrate RUE grip strength of 20+ lbs for increased functional use.   Time 8   Period Weeks   Status On-going  12 pounds on 03/18/2016 using 3rd notch     OT LONG TERM GOAL #6   Title Pt will perform all basic ADLs and simple meal prep modified independently.   Time 8   Period Weeks   Status Achieved               Plan - 05/07/16 1333    Clinical Impression Statement Patient with reduced grip strength s/p fistula placement, otherwise making slow / steady progress toward long term goal achievement.     Rehab Potential Good   Clinical Impairments Affecting Rehab Potential aphasia, L inattention, cognitive impairment   OT Frequency 2x / week   OT Duration 4 weeks   OT Treatment/Interventions Self-care/ADL training;Therapeutic exercise;Patient/family education;Balance training;Splinting;Manual Therapy;Neuromuscular education;Ultrasound;Therapeutic exercises;Therapeutic activities;DME and/or AE instruction;Parrafin;Cryotherapy;Electrical Stimulation;Fluidtherapy;Scar mobilization;Cognitive remediation/compensation;Moist Heat;Contrast Bath;Passive range of motion;Visual/perceptual remediation/compensation;Functional Mobility Training   Plan Manual and NMR Right UE - forearm, wrist and hand   Consulted and Agree with Plan of Care Patient      Patient will benefit from skilled therapeutic intervention in order to improve the following deficits and impairments:  Cardiopulmonary status limiting activity, Decreased activity tolerance, Decreased balance, Decreased cognition, Decreased endurance, Decreased coordination, Decreased strength, Increased edema, Impaired tone, Impaired UE functional use, Impaired vision/preception, Increased fascial restricitons  Visit Diagnosis: Other lack of  coordination  Muscle weakness (generalized)  Hemiplegia and hemiparesis following cerebral infarction affecting right dominant side (HCC)  Cognitive social or emotional deficit following cerebral infarction  Unsteadiness on feet    Problem List Patient Active Problem List   Diagnosis Date Noted  . Hemiparesis affecting right side as late effect of cerebrovascular accident ([place]) 02/28/2016  . Aphasia, late effect of cerebrovascular disease 02/28/2016  . Acute encephalopathy 01/08/2016  . Seizures ([place]) 01/08/2016  . History of ischemic left MCA stroke 01/08/2016  . Right sided weakness 01/08/2016  . Chronic combined systolic and diastolic CHF (congestive heart failure) ([place]) 01/08/2016  . Atrial fibrillation ([place]) 01/08/2016  . Leukocytosis 01/08/2016  . Benign essential HTN 01/08/2016  . Anemia of chronic disease 01/08/2016  . Controlled type 2 diabetes mellitus with diabetic nephropathy, without long-term current use of insulin ([place]) 01/08/2016  . History of DVT (deep vein thrombosis) 01/08/2016  . ESRD (end stage renal disease) on dialysis [facility])     Mariah Milling, OTR/L 05/07/2016, 1:37 PM  [facility] [address] [place], [place], [address] Phone: [phone]   Fax:  [phone]  Name: Isaiah [name] [facility] MRN: [id number] Date of Birth: February 19, 1966

## 2016-05-08 DIAGNOSIS — E1122 Type 2 diabetes mellitus with diabetic chronic kidney disease: Secondary | ICD-10-CM | POA: Diagnosis not present

## 2016-05-08 DIAGNOSIS — N186 End stage renal disease: Secondary | ICD-10-CM | POA: Diagnosis not present

## 2016-05-08 DIAGNOSIS — Z992 Dependence on renal dialysis: Secondary | ICD-10-CM | POA: Diagnosis not present

## 2016-05-09 DIAGNOSIS — N186 End stage renal disease: Secondary | ICD-10-CM | POA: Diagnosis not present

## 2016-05-09 DIAGNOSIS — D631 Anemia in chronic kidney disease: Secondary | ICD-10-CM | POA: Diagnosis not present

## 2016-05-09 DIAGNOSIS — D688 Other specified coagulation defects: Secondary | ICD-10-CM | POA: Diagnosis not present

## 2016-05-09 DIAGNOSIS — E1122 Type 2 diabetes mellitus with diabetic chronic kidney disease: Secondary | ICD-10-CM | POA: Diagnosis not present

## 2016-05-09 DIAGNOSIS — T8249XA Other complication of vascular dialysis catheter, initial encounter: Secondary | ICD-10-CM | POA: Diagnosis not present

## 2016-05-09 DIAGNOSIS — E1129 Type 2 diabetes mellitus with other diabetic kidney complication: Secondary | ICD-10-CM | POA: Diagnosis not present

## 2016-05-09 DIAGNOSIS — Z992 Dependence on renal dialysis: Secondary | ICD-10-CM | POA: Diagnosis not present

## 2016-05-12 DIAGNOSIS — N186 End stage renal disease: Secondary | ICD-10-CM | POA: Diagnosis not present

## 2016-05-12 DIAGNOSIS — D688 Other specified coagulation defects: Secondary | ICD-10-CM | POA: Diagnosis not present

## 2016-05-12 DIAGNOSIS — E1129 Type 2 diabetes mellitus with other diabetic kidney complication: Secondary | ICD-10-CM | POA: Diagnosis not present

## 2016-05-12 DIAGNOSIS — T8249XA Other complication of vascular dialysis catheter, initial encounter: Secondary | ICD-10-CM | POA: Diagnosis not present

## 2016-05-12 DIAGNOSIS — D631 Anemia in chronic kidney disease: Secondary | ICD-10-CM | POA: Diagnosis not present

## 2016-05-13 MED FILL — SENSIPAR 60 MG TABLET: 60 | 30 days supply | Qty: 30 | Fill #2

## 2016-05-13 MED FILL — CONTOUR NEXT STRIPS: 33 days supply | Qty: 100 | Fill #3

## 2016-05-14 ENCOUNTER — Ambulatory Visit: Payer: 59 | Admitting: Occupational Therapy

## 2016-05-14 DIAGNOSIS — D631 Anemia in chronic kidney disease: Secondary | ICD-10-CM | POA: Diagnosis not present

## 2016-05-14 DIAGNOSIS — D688 Other specified coagulation defects: Secondary | ICD-10-CM | POA: Diagnosis not present

## 2016-05-14 DIAGNOSIS — N186 End stage renal disease: Secondary | ICD-10-CM | POA: Diagnosis not present

## 2016-05-14 DIAGNOSIS — T8249XA Other complication of vascular dialysis catheter, initial encounter: Secondary | ICD-10-CM | POA: Diagnosis not present

## 2016-05-14 DIAGNOSIS — E1129 Type 2 diabetes mellitus with other diabetic kidney complication: Secondary | ICD-10-CM | POA: Diagnosis not present

## 2016-05-15 DIAGNOSIS — T82858A Stenosis of vascular prosthetic devices, implants and grafts, initial encounter: Secondary | ICD-10-CM | POA: Diagnosis not present

## 2016-05-15 DIAGNOSIS — I871 Compression of vein: Secondary | ICD-10-CM | POA: Diagnosis not present

## 2016-05-15 DIAGNOSIS — N186 End stage renal disease: Secondary | ICD-10-CM | POA: Diagnosis not present

## 2016-05-15 DIAGNOSIS — Z992 Dependence on renal dialysis: Secondary | ICD-10-CM | POA: Diagnosis not present

## 2016-05-16 ENCOUNTER — Encounter: Payer: Self-pay | Admitting: Occupational Therapy

## 2016-05-16 DIAGNOSIS — D688 Other specified coagulation defects: Secondary | ICD-10-CM | POA: Diagnosis not present

## 2016-05-16 DIAGNOSIS — E1129 Type 2 diabetes mellitus with other diabetic kidney complication: Secondary | ICD-10-CM | POA: Diagnosis not present

## 2016-05-16 DIAGNOSIS — T8249XA Other complication of vascular dialysis catheter, initial encounter: Secondary | ICD-10-CM | POA: Diagnosis not present

## 2016-05-16 DIAGNOSIS — D631 Anemia in chronic kidney disease: Secondary | ICD-10-CM | POA: Diagnosis not present

## 2016-05-16 DIAGNOSIS — N186 End stage renal disease: Secondary | ICD-10-CM | POA: Diagnosis not present

## 2016-05-17 MED FILL — LANTUS SOLOSTAR 100 UNITS/M: 100 | 60 days supply | Qty: 15 | Fill #2

## 2016-05-19 ENCOUNTER — Encounter: Payer: Self-pay | Admitting: Occupational Therapy

## 2016-05-19 ENCOUNTER — Ambulatory Visit: Payer: 59 | Attending: Physical Medicine & Rehabilitation | Admitting: Occupational Therapy

## 2016-05-19 DIAGNOSIS — T8249XA Other complication of vascular dialysis catheter, initial encounter: Secondary | ICD-10-CM | POA: Diagnosis not present

## 2016-05-19 DIAGNOSIS — E1129 Type 2 diabetes mellitus with other diabetic kidney complication: Secondary | ICD-10-CM | POA: Diagnosis not present

## 2016-05-19 DIAGNOSIS — D631 Anemia in chronic kidney disease: Secondary | ICD-10-CM | POA: Diagnosis not present

## 2016-05-19 DIAGNOSIS — N2581 Secondary hyperparathyroidism of renal origin: Secondary | ICD-10-CM | POA: Diagnosis not present

## 2016-05-19 DIAGNOSIS — R278 Other lack of coordination: Secondary | ICD-10-CM | POA: Diagnosis not present

## 2016-05-19 DIAGNOSIS — I69351 Hemiplegia and hemiparesis following cerebral infarction affecting right dominant side: Secondary | ICD-10-CM | POA: Diagnosis not present

## 2016-05-19 DIAGNOSIS — M6281 Muscle weakness (generalized): Secondary | ICD-10-CM

## 2016-05-19 DIAGNOSIS — R2681 Unsteadiness on feet: Secondary | ICD-10-CM

## 2016-05-19 DIAGNOSIS — D688 Other specified coagulation defects: Secondary | ICD-10-CM | POA: Diagnosis not present

## 2016-05-19 DIAGNOSIS — R482 Apraxia: Secondary | ICD-10-CM | POA: Diagnosis not present

## 2016-05-19 DIAGNOSIS — Z23 Encounter for immunization: Secondary | ICD-10-CM | POA: Diagnosis not present

## 2016-05-19 DIAGNOSIS — L02811 Cutaneous abscess of head [any part, except face]: Secondary | ICD-10-CM | POA: Diagnosis not present

## 2016-05-19 DIAGNOSIS — I69315 Cognitive social or emotional deficit following cerebral infarction: Secondary | ICD-10-CM | POA: Diagnosis not present

## 2016-05-19 DIAGNOSIS — D509 Iron deficiency anemia, unspecified: Secondary | ICD-10-CM | POA: Diagnosis not present

## 2016-05-19 DIAGNOSIS — N186 End stage renal disease: Secondary | ICD-10-CM | POA: Diagnosis not present

## 2016-05-19 NOTE — Therapy (Signed)
[facility] [address] [place], [place], [address] Phone: [phone]   Fax:  [phone]  Occupational Therapy Treatment  Patient Details  Name: Isaiah [name] [name] MRN: [id number] Date of Birth: 26-Aug-1966 Referring Provider: Alysia Penna  Encounter Date: 05/19/2016      OT End of Session - 05/19/16 1152    OT Start Time 1100   OT Stop Time 1145   OT Time Calculation (min) 45 min      Past Medical History:  Diagnosis Date  . A-fib ([place])   . Acute renal failure ([place]) 07/2011  . Anemia   . Asthma   . Diabetes mellitus without complication ([place])   . ESRD (end stage renal disease) on dialysis ([place])   . Gout   . History of recent blood transfusion 10/27/14   2 Units PRBC's  . Hyperkalemia 07/2011  . Hypertension   . Monoclonal gammopathies   . Monoclonal gammopathy   . OSA on CPAP   . Pulmonary embolism ([place]) 07/2011   a. Tx with Coumadin for 6 months (unknown cause per patient).  . Pulmonary embolism ([place]) 07/2011; 09/27/2014   a. Bilat PE 07/2011 - unclear cause, tx with 6 months Coumadin.;   . Seizures ([place])   . Sickle cell-thalassemia disease ([place])    a. Sickle cell trait.  . Sleep apnea    a. uses CPAP.  [name] Stroke [facility])     Past Surgical History:  Procedure Laterality Date  . AV FISTULA PLACEMENT Right 12/05/2015   Procedure: INSERTION OF ARTERIOVENOUS (AV) GORE-TEX GRAFT ARM;  Surgeon: Elam Dutch, MD;  Location: [facility];  Service: Vascular;  Laterality: Right;  . BONE MARROW BIOPSY    . CHOLECYSTECTOMY    . CHOLECYSTECTOMY  1990's?  . EYE SURGERY Right   . INSERTION OF DIALYSIS CATHETER Right 10/16/2015   Procedure: INSERTION OF PALINDROME DIALYSIS CATHETER ;  Surgeon: Elam Dutch, MD;  Location: [facility] [facility];  Service: Vascular;  Laterality: Right;  . OTHER SURGICAL HISTORY     Retinal surgery  . PARS PLANA VITRECTOMY  02/17/2012   Procedure: PARS PLANA VITRECTOMY WITH 25 GAUGE;  Surgeon: Hayden Pedro, MD;  Location: [place];  Service: Ophthalmology;  Laterality: Right;  . PERIPHERAL VASCULAR CATHETERIZATION N/A 03/20/2016   Procedure: A/V Shuntogram/Fistulagram;  Surgeon: Conrad Bell, MD;  Location: [place] [facility];  Service: Cardiovascular;  Laterality: N/A;    There were no vitals filed for this visit.      Subjective Assessment - 05/19/16 1109    Subjective  They worked on my fistula - working well (right forearm)                      OT Treatments/Exercises (OP) - 05/19/16 0001      Hand Exercises   Digiticizer 1.5lbs x5 reps, 3.0lbs x 10 reps en masse, 5.0 lbs was too challenging at this time.     Other Hand Exercises Today's session with emphasis on hand strengthening.  Reviewed current putty exercise, and assessed ability to upgrade to red - medium soft.  Patient able to complete several of his home exercises with green (medium putty) but quality of motion poor - increases tremors.  Worked to improve overall grip strength with large gripper on lightest setting over multiple repetitions (7 minutes)                  OT Education - 05/19/16 1148  Education provided Yes   Education Details reviewed HEP and upgraded resistance   Person(s) Educated Patient   Methods Explanation;Demonstration   Comprehension Verbalized understanding;Returned demonstration          OT Short Term Goals - 05/05/16 0901      OT SHORT TERM GOAL #1   Title I with initiall HEP- 03/20/2016   Time 4   Period Weeks   Status Achieved     OT SHORT TERM GOAL #2   Title Pt will perform all basic ADLS with supervision.   Time 4   Period Weeks   Status Achieved     OT SHORT TERM GOAL #3   Title Patient will demonstrate improved functional strength in right UE as evidenced by brushing teeth with dominant right hand   Time 4   Period Weeks   Status Achieved     OT SHORT TERM GOAL #4   Title Pt will demonstrate ability to retrieve a light weight (less than 1  lb) object at 120 shoulder flexion with RUE.   Time 4   Period Weeks   Status Achieved  3 pound object     OT SHORT TERM GOAL #5   Title Patient will demostrate improved coordination for right hand as evidenced by 15 on box and blocks test   Time 4   Period Weeks   Status Achieved  25 on 03/12/2016     OT SHORT TERM GOAL #6   Title Pt will report feeding himself with RUE 75% of the time.   Time 4   Period Weeks   Status Achieved           OT Long Term Goals - 05/05/16 0901      OT LONG TERM GOAL #1   Title I with updated HEP.- 05/29/2016   Time 8   Period Weeks   Status On-going     OT LONG TERM GOAL #2   Title Pt will demonstrate ability to write his name legibily.   Time 8   Period Weeks   Status On-going     OT LONG TERM GOAL #3   Title Patient will demonstrate adequate strength and coordination in dominant right hand to tighten laces and tie shoes using bilateral technique   Time 8   Period Weeks   Status Achieved     OT LONG TERM GOAL #4   Title Assess 9 hole peg test and determine goal   Status Achieved  pt unable to do 1 in 1 minute time      OT LONG TERM GOAL #5   Title Pt will demonstrate RUE grip strength of 20+ lbs for increased functional use.   Time 8   Period Weeks   Status On-going  12 pounds on 03/18/2016 using 3rd notch     OT LONG TERM GOAL #6   Title Pt will perform all basic ADLs and simple meal prep modified independently.   Time 8   Period Weeks   Status Achieved               Plan - 05/19/16 1148    Clinical Impression Statement Patient showing steady progress toward long term goals. Patient planning a two week break from therapy following this week to assess a property in [place]   Clinical Impairments Affecting Rehab Potential aphasia, L inattention, cognitive impairment   OT Frequency 2x / week   OT Duration 4 weeks   OT Treatment/Interventions Self-care/ADL  training;Therapeutic  exercise;Patient/family education;Balance training;Splinting;Manual Therapy;Neuromuscular education;Ultrasound;Therapeutic exercises;Therapeutic activities;DME and/or AE instruction;Parrafin;Cryotherapy;Electrical Stimulation;Fluidtherapy;Scar mobilization;Cognitive remediation/compensation;Moist Heat;Contrast Bath;Passive range of motion;Visual/perceptual remediation/compensation;Functional Mobility Training   Plan Manual and NMR RUE, strengthening R hand   OT Home Exercise Plan Education issued:  12/31/15 initial HEP (coordination, cane ex);  added putty on 02/26/2016   Consulted and Agree with Plan of Care Patient      Patient will benefit from skilled therapeutic intervention in order to improve the following deficits and impairments:  Cardiopulmonary status limiting activity, Decreased activity tolerance, Decreased balance, Decreased cognition, Decreased endurance, Decreased coordination, Decreased strength, Increased edema, Impaired tone, Impaired UE functional use, Impaired vision/preception, Increased fascial restricitons  Visit Diagnosis: Other lack of coordination  Muscle weakness (generalized)  Hemiplegia and hemiparesis following cerebral infarction affecting right dominant side (HCC)  Cognitive social or emotional deficit following cerebral infarction  Unsteadiness on feet    Problem List Patient Active Problem List   Diagnosis Date Noted  . Hemiparesis affecting right side as late effect of cerebrovascular accident ([place]) 02/28/2016  . Aphasia, late effect of cerebrovascular disease 02/28/2016  . Acute encephalopathy 01/08/2016  . Seizures ([place]) 01/08/2016  . History of ischemic left MCA stroke 01/08/2016  . Right sided weakness 01/08/2016  . Chronic combined systolic and diastolic CHF (congestive heart failure) ([place]) 01/08/2016  . Atrial fibrillation ([place]) 01/08/2016  . Leukocytosis 01/08/2016  . Benign essential HTN 01/08/2016  . Anemia of chronic disease 01/08/2016   . Controlled type 2 diabetes mellitus with diabetic nephropathy, without long-term current use of insulin ([place]) 01/08/2016  . History of DVT (deep vein thrombosis) 01/08/2016  . ESRD (end stage renal disease) on dialysis [facility])     Mariah Milling, OTR/L 05/19/2016, 11:53 AM  [facility] [address] [place], [place], [address] Phone: [phone]   Fax:  [phone]  Name: Isaiah [name] [facility] MRN: [id number] Date of Birth: November 16, 1965

## 2016-05-21 ENCOUNTER — Ambulatory Visit: Payer: 59 | Admitting: Occupational Therapy

## 2016-05-21 ENCOUNTER — Encounter: Payer: Self-pay | Admitting: Occupational Therapy

## 2016-05-21 DIAGNOSIS — M6281 Muscle weakness (generalized): Secondary | ICD-10-CM

## 2016-05-21 DIAGNOSIS — R482 Apraxia: Secondary | ICD-10-CM | POA: Diagnosis not present

## 2016-05-21 DIAGNOSIS — I69351 Hemiplegia and hemiparesis following cerebral infarction affecting right dominant side: Secondary | ICD-10-CM

## 2016-05-21 DIAGNOSIS — E1129 Type 2 diabetes mellitus with other diabetic kidney complication: Secondary | ICD-10-CM | POA: Diagnosis not present

## 2016-05-21 DIAGNOSIS — D688 Other specified coagulation defects: Secondary | ICD-10-CM | POA: Diagnosis not present

## 2016-05-21 DIAGNOSIS — N186 End stage renal disease: Secondary | ICD-10-CM | POA: Diagnosis not present

## 2016-05-21 DIAGNOSIS — I69315 Cognitive social or emotional deficit following cerebral infarction: Secondary | ICD-10-CM | POA: Diagnosis not present

## 2016-05-21 DIAGNOSIS — R278 Other lack of coordination: Secondary | ICD-10-CM | POA: Diagnosis not present

## 2016-05-21 DIAGNOSIS — D631 Anemia in chronic kidney disease: Secondary | ICD-10-CM | POA: Diagnosis not present

## 2016-05-21 DIAGNOSIS — Z23 Encounter for immunization: Secondary | ICD-10-CM | POA: Diagnosis not present

## 2016-05-21 DIAGNOSIS — L02811 Cutaneous abscess of head [any part, except face]: Secondary | ICD-10-CM | POA: Diagnosis not present

## 2016-05-21 DIAGNOSIS — N2581 Secondary hyperparathyroidism of renal origin: Secondary | ICD-10-CM | POA: Diagnosis not present

## 2016-05-21 DIAGNOSIS — D509 Iron deficiency anemia, unspecified: Secondary | ICD-10-CM | POA: Diagnosis not present

## 2016-05-21 DIAGNOSIS — R2681 Unsteadiness on feet: Secondary | ICD-10-CM | POA: Diagnosis not present

## 2016-05-21 DIAGNOSIS — T8249XA Other complication of vascular dialysis catheter, initial encounter: Secondary | ICD-10-CM | POA: Diagnosis not present

## 2016-05-21 NOTE — Therapy (Signed)
[facility] [address] [place], [place], [address] Phone: [phone]   Fax:  [phone]  Occupational Therapy Treatment  Patient Details  Name: Isaiah [name] [facility] MRN: [id number] Date of Birth: [birth year] Referring Provider: Alysia Penna  Encounter Date: 05/21/2016      OT End of Session - 05/21/16 1246    Visit Number 13   Number of Visits 17   Date for OT Re-Evaluation 06/26/16   Authorization Type medicare   Authorization Time Period "$20 co pay, no visit limit, no auth required"   Authorization - Visit Number 13   Authorization - Number of Visits 20   OT Start Time 1100   OT Stop Time 1150   OT Time Calculation (min) 50 min   Activity Tolerance Patient tolerated treatment well   Behavior During Therapy [name] for tasks assessed/performed      Past Medical History:  Diagnosis Date  . A-fib ([place])   . Acute renal failure ([place]) 07/2011  . Anemia   . Asthma   . Diabetes mellitus without complication ([place])   . ESRD (end stage renal disease) on dialysis ([place])   . Gout   . History of recent blood transfusion 10/27/14   2 Units PRBC's  . Hyperkalemia 07/2011  . Hypertension   . Monoclonal gammopathies   . Monoclonal gammopathy   . OSA on CPAP   . Pulmonary embolism ([place]) 07/2011   a. Tx with Coumadin for 6 months (unknown cause per patient).  . Pulmonary embolism ([place]) 07/2011; 09/27/2014   a. Bilat PE 07/2011 - unclear cause, tx with 6 months Coumadin.;   . Seizures ([place])   . Sickle cell-thalassemia disease ([place])    a. Sickle cell trait.  . Sleep apnea    a. uses CPAP.  [name] Stroke [facility])     Past Surgical History:  Procedure Laterality Date  . AV FISTULA PLACEMENT Right 12/05/2015   Procedure: INSERTION OF ARTERIOVENOUS (AV) GORE-TEX GRAFT ARM;  Surgeon: Elam Dutch, MD;  Location: [facility];  Service: Vascular;  Laterality: Right;  . BONE MARROW BIOPSY    . CHOLECYSTECTOMY    . CHOLECYSTECTOMY   1990's?  . EYE SURGERY Right   . INSERTION OF DIALYSIS CATHETER Right 10/16/2015   Procedure: INSERTION OF PALINDROME DIALYSIS CATHETER ;  Surgeon: Elam Dutch, MD;  Location: [facility];  Service: Vascular;  Laterality: Right;  . OTHER SURGICAL HISTORY     Retinal surgery  . PARS PLANA VITRECTOMY  02/17/2012   Procedure: PARS PLANA VITRECTOMY WITH 25 GAUGE;  Surgeon: Hayden Pedro, MD;  Location: [facility];  Service: Ophthalmology;  Laterality: Right;  . PERIPHERAL VASCULAR CATHETERIZATION N/A 03/20/2016   Procedure: A/V Shuntogram/Fistulagram;  Surgeon: Conrad [place], MD;  Location: [facility];  Service: Cardiovascular;  Laterality: N/A;    There were no vitals filed for this visit.      Subjective Assessment - 05/21/16 1228    Subjective  I am starting to feel more here (ulnar side of hand and arm)   Pertinent History see Epic    Patient Stated Goals to use right hand better   Currently in Pain? No/denies   Pain Score 0-No pain                      OT Treatments/Exercises (OP) - 05/21/16 0001      Wrist Exercises   Bar Weights/Barbell (Forearm Supination) 2 lbs   Forearm  Supination Limitations Patient with inadequate supination to complete with proper form   Bar Weights/[place]bell (Wrist Extension) 2 lbs   Wrist Extension Limitations Unable to complete with proper form   Other wrist exercises Patient with significantly limited wrist motion in all dorections, but mainly extension, and radial/ulnar deviation.       Manual Therapy   Joint Mobilization Patient with significant limitations to wrist motion.  Provided carpal mobilization to increase glide between proximal row and forearm - to increase supination, and distal row and metacarpals to aide with wrist motion.  Patient very tight - needed prolonged traction to mobilize.  Following mobilization patient with improved passive and active wrist extension, radial / ulnar deveiation, and forearm supination.  Also wrapped  digits into full flexed position to promote increase range at PIP right index. Patient with goal of touching pad to palm with right index.  Patient able to effectively passively range digit.  PIP extension thought limited, and considering a spring action finger splint.                   OT Education - 05/21/16 1245    Education provided Yes   Education Details stretching for digits   Person(s) Educated Patient   Methods Explanation;Demonstration   Comprehension Need further instruction          OT Short Term Goals - 05/05/16 0901      OT SHORT TERM GOAL #1   Title I with initiall HEP- 03/20/2016   Time 4   Period Weeks   Status Achieved     OT SHORT TERM GOAL #2   Title Pt will perform all basic ADLS with supervision.   Time 4   Period Weeks   Status Achieved     OT SHORT TERM GOAL #3   Title Patient will demonstrate improved functional strength in right UE as evidenced by brushing teeth with dominant right hand   Time 4   Period Weeks   Status Achieved     OT SHORT TERM GOAL #4   Title Pt will demonstrate ability to retrieve a light weight (less than 1 lb) object at 120 shoulder flexion with RUE.   Time 4   Period Weeks   Status Achieved  3 pound object     OT SHORT TERM GOAL #5   Title Patient will demostrate improved coordination for right hand as evidenced by 15 on box and blocks test   Time 4   Period Weeks   Status Achieved  25 on 03/12/2016     OT SHORT TERM GOAL #6   Title Pt will report feeding himself with RUE 75% of the time.   Time 4   Period Weeks   Status Achieved           OT Long Term Goals - 05/19/16 1701      OT LONG TERM GOAL #1   Title I with updated HEP.- 05/29/2016   Status On-going     OT LONG TERM GOAL #2   Title Pt will demonstrate ability to write his name legibily.   Status On-going     OT LONG TERM GOAL #3   Title Patient will demonstrate adequate strength and coordination in dominant right hand to tighten laces  and tie shoes using bilateral technique   Status Achieved     OT LONG TERM GOAL #4   Title Assess 9 hole peg test and determine goal   Status Achieved     OT  LONG TERM GOAL #5   Title Pt will demonstrate RUE grip strength of 20+ lbs for increased functional use.   Status Achieved  20 lbs     OT LONG TERM GOAL #6   Title Pt will perform all basic ADLs and simple meal prep modified independently.   Status Achieved               Plan - 05/21/16 1246    Clinical Impression Statement Patient showing steady progression toward long term goals. Anticipate timely discharge, and full achievement of LTG's   Rehab Potential Good   Clinical Impairments Affecting Rehab Potential aphasia, L inattention, cognitive impairment   OT Frequency 2x / week   OT Duration 4 weeks   OT Treatment/Interventions Self-care/ADL training;Therapeutic exercise;Patient/family education;Balance training;Splinting;Manual Therapy;Neuromuscular education;Ultrasound;Therapeutic exercises;Therapeutic activities;DME and/or AE instruction;Parrafin;Cryotherapy;Electrical Stimulation;Fluidtherapy;Scar mobilization;Cognitive remediation/compensation;Moist Heat;Contrast Bath;Passive range of motion;Visual/perceptual remediation/compensation;Functional Mobility Training   Plan Manual - wrist, spring splint? right index, NMR Rhand, wrist, forearm   OT Home Exercise Plan Education issued:  12/31/15 initial HEP (coordination, cane ex);  added putty on 02/26/2016   Consulted and Agree with Plan of Care Patient      Patient will benefit from skilled therapeutic intervention in order to improve the following deficits and impairments:  Cardiopulmonary status limiting activity, Decreased activity tolerance, Decreased balance, Decreased cognition, Decreased endurance, Decreased coordination, Decreased strength, Increased edema, Impaired tone, Impaired UE functional use, Impaired vision/preception, Increased fascial restricitons  Visit  Diagnosis: Other lack of coordination  Muscle weakness (generalized)  Hemiplegia and hemiparesis following cerebral infarction affecting right dominant side (HCC)  Cognitive social or emotional deficit following cerebral infarction  Unsteadiness on feet  Apraxia    Problem List Patient Active Problem List   Diagnosis Date Noted  . Hemiparesis affecting right side as late effect of cerebrovascular accident ([place]) 02/28/2016  . Aphasia, late effect of cerebrovascular disease 02/28/2016  . Acute encephalopathy 01/08/2016  . Seizures ([place]) 01/08/2016  . History of ischemic left MCA stroke 01/08/2016  . Right sided weakness 01/08/2016  . Chronic combined systolic and diastolic CHF (congestive heart failure) ([place]) 01/08/2016  . Atrial fibrillation ([place]) 01/08/2016  . Leukocytosis 01/08/2016  . Benign essential HTN 01/08/2016  . Anemia of chronic disease 01/08/2016  . Controlled type 2 diabetes mellitus with diabetic nephropathy, without long-term current use of insulin ([place]) 01/08/2016  . History of DVT (deep vein thrombosis) 01/08/2016  . ESRD (end stage renal disease) on dialysis [facility])     Mariah Milling 05/21/2016, 12:49 PM  [facility] [address] [place], [place], [address] Phone: [phone]   Fax:  [phone]  Name: Isaiah [name] [facility] MRN: [id number] Date of Birth: Oct 21, 1965

## 2016-05-22 ENCOUNTER — Ambulatory Visit: Payer: 59 | Admitting: Physical Medicine & Rehabilitation

## 2016-05-22 ENCOUNTER — Encounter: Payer: Self-pay | Admitting: Physical Medicine & Rehabilitation

## 2016-05-22 ENCOUNTER — Encounter: Payer: 59 | Attending: Physical Medicine & Rehabilitation

## 2016-05-22 ENCOUNTER — Ambulatory Visit (HOSPITAL_BASED_OUTPATIENT_CLINIC_OR_DEPARTMENT_OTHER): Payer: 59 | Admitting: Physical Medicine & Rehabilitation

## 2016-05-22 VITALS — BP 122/83 | HR 70 | Resp 17

## 2016-05-22 DIAGNOSIS — I639 Cerebral infarction, unspecified: Secondary | ICD-10-CM

## 2016-05-22 DIAGNOSIS — I69920 Aphasia following unspecified cerebrovascular disease: Secondary | ICD-10-CM | POA: Insufficient documentation

## 2016-05-22 DIAGNOSIS — I69351 Hemiplegia and hemiparesis following cerebral infarction affecting right dominant side: Secondary | ICD-10-CM | POA: Diagnosis not present

## 2016-05-22 DIAGNOSIS — E875 Hyperkalemia: Secondary | ICD-10-CM | POA: Insufficient documentation

## 2016-05-22 DIAGNOSIS — D5740 Sickle-cell thalassemia without crisis: Secondary | ICD-10-CM | POA: Diagnosis not present

## 2016-05-22 DIAGNOSIS — G8111 Spastic hemiplegia affecting right dominant side: Secondary | ICD-10-CM | POA: Insufficient documentation

## 2016-05-22 DIAGNOSIS — N179 Acute kidney failure, unspecified: Secondary | ICD-10-CM | POA: Diagnosis not present

## 2016-05-22 DIAGNOSIS — E1122 Type 2 diabetes mellitus with diabetic chronic kidney disease: Secondary | ICD-10-CM | POA: Diagnosis not present

## 2016-05-22 DIAGNOSIS — N186 End stage renal disease: Secondary | ICD-10-CM | POA: Diagnosis not present

## 2016-05-22 DIAGNOSIS — N185 Chronic kidney disease, stage 5: Secondary | ICD-10-CM | POA: Diagnosis not present

## 2016-05-22 DIAGNOSIS — Z86711 Personal history of pulmonary embolism: Secondary | ICD-10-CM | POA: Diagnosis not present

## 2016-05-22 DIAGNOSIS — I120 Hypertensive chronic kidney disease with stage 5 chronic kidney disease or end stage renal disease: Secondary | ICD-10-CM | POA: Insufficient documentation

## 2016-05-22 DIAGNOSIS — Z992 Dependence on renal dialysis: Secondary | ICD-10-CM | POA: Insufficient documentation

## 2016-05-22 DIAGNOSIS — Z9115 Patient's noncompliance with renal dialysis: Secondary | ICD-10-CM | POA: Insufficient documentation

## 2016-05-22 DIAGNOSIS — T82868D Thrombosis of vascular prosthetic devices, implants and grafts, subsequent encounter: Secondary | ICD-10-CM | POA: Diagnosis not present

## 2016-05-22 DIAGNOSIS — Z8673 Personal history of transient ischemic attack (TIA), and cerebral infarction without residual deficits: Secondary | ICD-10-CM | POA: Insufficient documentation

## 2016-05-22 DIAGNOSIS — I871 Compression of vein: Secondary | ICD-10-CM | POA: Diagnosis not present

## 2016-05-22 DIAGNOSIS — K859 Acute pancreatitis without necrosis or infection, unspecified: Secondary | ICD-10-CM | POA: Insufficient documentation

## 2016-05-22 DIAGNOSIS — R51 Headache: Secondary | ICD-10-CM | POA: Diagnosis not present

## 2016-05-22 MED FILL — BREO ELLIPTA 200-25 MCG INH: 200-25 | 30 days supply | Qty: 60 | Fill #3

## 2016-05-22 NOTE — Progress Notes (Signed)
Subjective:    Patient ID: Isaiah [name], male    DOB: 1966/04/12, 50 y.o.   MRN: [id number]  HPI 50 year old male with history of left MCA distribution infarct causing right hemiparesis and aphasia secondary to a patent foramen ovale. Patient is on Coumadin. He had post stroke seizure and has been managed by neurology, seizure was in April 2017 He has end-stage renal disease and undergoes dialysis, has a new AV fistula Patient is receiving outpatient OT at neuro rehabilitation. He plans to restart speech therapy next month Pain Inventory Average Pain 0 Pain Right Now 0 My pain is NA  In the last 24 hours, has pain interfered with the following? General activity 4 Relation with others 9 Enjoyment of life 8 What TIME of day is your pain at its worst? NA Sleep (in general) Good  Pain is worse with: NA Pain improves with: NA Relief from Meds: NA  Mobility walk without assistance  Function disabled: date disabled 09/16/15  Neuro/Psych No problems in this area  Prior Studies Any changes since last visit?  no  Physicians involved in your care Any changes since last visit?  no   Family History  Problem Relation Age of Onset  . Hypertension Mother   . Diabetes Mother   . Stroke Father   . Sickle cell anemia Brother   . Diabetes type I Brother   . Kidney disease Brother    Social History   Social History  . Marital status: Married    Spouse name: sylvia  . Number of children: 1  . Years of education: college   Occupational History  . Edie History Main Topics  . Smoking status: Never Smoker  . Smokeless tobacco: Never Used  . Alcohol use No  . Drug use: No  . Sexual activity: Yes   Other Topics Concern  . None   Social History Narrative   ** Merged History Encounter **       Past Surgical History:  Procedure Laterality Date  . AV FISTULA PLACEMENT Right 12/05/2015   Procedure: INSERTION OF ARTERIOVENOUS (AV) GORE-TEX  GRAFT ARM;  Surgeon: Elam Dutch, MD;  Location: [facility];  Service: Vascular;  Laterality: Right;  . BONE MARROW BIOPSY    . CHOLECYSTECTOMY    . CHOLECYSTECTOMY  1990's?  . EYE SURGERY Right   . INSERTION OF DIALYSIS CATHETER Right 10/16/2015   Procedure: INSERTION OF PALINDROME DIALYSIS CATHETER ;  Surgeon: Elam Dutch, MD;  Location: [facility];  Service: Vascular;  Laterality: Right;  . OTHER SURGICAL HISTORY     Retinal surgery  . PARS PLANA VITRECTOMY  02/17/2012   Procedure: PARS PLANA VITRECTOMY WITH 25 GAUGE;  Surgeon: Hayden Pedro, MD;  Location: [facility];  Service: Ophthalmology;  Laterality: Right;  . PERIPHERAL VASCULAR CATHETERIZATION N/A 03/20/2016   Procedure: A/V Shuntogram/Fistulagram;  Surgeon: Conrad Cowlitz, MD;  Location: [facility];  Service: Cardiovascular;  Laterality: N/A;   Past Medical History:  Diagnosis Date  . A-fib ([place])   . Acute renal failure ([place]) 07/2011  . Anemia   . Asthma   . Diabetes mellitus without complication ([place])   . ESRD (end stage renal disease) on dialysis ([place])   . Gout   . History of recent blood transfusion 10/27/14   2 Units PRBC's  . Hyperkalemia 07/2011  . Hypertension   . Monoclonal gammopathies   . Monoclonal gammopathy   . OSA on  CPAP   . Pulmonary embolism ([place]) 07/2011   a. Tx with Coumadin for 6 months (unknown cause per patient).  . Pulmonary embolism ([place]) 07/2011; 09/27/2014   a. Bilat PE 07/2011 - unclear cause, tx with 6 months Coumadin.;   . Seizures ([place])   . Sickle cell-thalassemia disease ([place])    a. Sickle cell trait.  . Sleep apnea    a. uses CPAP.  [name] Stroke ([place])    BP 122/83   Pulse 70   Resp 17   Opioid Risk Score:   Fall Risk Score:  `1  Depression screen PHQ 2/9  Depression screen [facility] 2/9 03/14/2016 12/11/2015 11/16/2015 03/22/2015  Decreased Interest 0 0 0 0  Down, Depressed, Hopeless 0 0 0 0  PHQ - 2 Score 0 0 0 0  Altered sleeping - - 0 -  Tired, decreased energy - - 0 -  Change in  appetite - - 0 -  Feeling bad or failure about yourself  - - 0 -  Trouble concentrating - - 0 -  Moving slowly or fidgety/restless - - 0 -  Suicidal thoughts - - 0 -  PHQ-9 Score - - 0 -  Some recent data might be hidden    Review of Systems  All other systems reviewed and are negative.      Objective:   Physical Exam  Constitutional: He is oriented to person, place, and time. He appears well-developed and well-nourished.  HENT:  Head: Normocephalic and atraumatic.  Neurological: He is alert and oriented to person, place, and time.  Moderate expressive aphasia. No evidence of motor apraxia. His motor strength is 3 plus in the right deltoid, biceps, grip, hip flexors are 3 minus, knee extensors are 4. Ankle dorsiflexor and plantar flexor 4/5 5/5 strength throughout in the left upper and left lower limb  Psychiatric: He has a normal mood and affect.  Nursing note and vitals reviewed.         Assessment & Plan:  1. Left MCA infarct with residual expressive aphasia and right hemiparesis. Expect him to start plateauing in his right upper extremity strength and coordination, however, he still has potential for continued improvement for his aphasia  The patient is asking about driving. However, he had a seizure less than 6 months ago and will need clearance from neurology after 6 months total, seizure-free Return to clinic in 4 months

## 2016-05-22 NOTE — Patient Instructions (Signed)
Because of the history of seizures in April 2017. You will need to have the clearance of neurology, Dr. Leonie Man. Prior to resuming driving

## 2016-05-23 DIAGNOSIS — D688 Other specified coagulation defects: Secondary | ICD-10-CM | POA: Diagnosis not present

## 2016-05-23 DIAGNOSIS — D509 Iron deficiency anemia, unspecified: Secondary | ICD-10-CM | POA: Diagnosis not present

## 2016-05-23 DIAGNOSIS — Z23 Encounter for immunization: Secondary | ICD-10-CM | POA: Diagnosis not present

## 2016-05-23 DIAGNOSIS — T8249XA Other complication of vascular dialysis catheter, initial encounter: Secondary | ICD-10-CM | POA: Diagnosis not present

## 2016-05-23 DIAGNOSIS — N186 End stage renal disease: Secondary | ICD-10-CM | POA: Diagnosis not present

## 2016-05-23 DIAGNOSIS — N2581 Secondary hyperparathyroidism of renal origin: Secondary | ICD-10-CM | POA: Diagnosis not present

## 2016-05-23 DIAGNOSIS — D631 Anemia in chronic kidney disease: Secondary | ICD-10-CM | POA: Diagnosis not present

## 2016-05-23 DIAGNOSIS — E1129 Type 2 diabetes mellitus with other diabetic kidney complication: Secondary | ICD-10-CM | POA: Diagnosis not present

## 2016-05-27 DIAGNOSIS — R569 Unspecified convulsions: Secondary | ICD-10-CM | POA: Diagnosis not present

## 2016-05-27 DIAGNOSIS — D509 Iron deficiency anemia, unspecified: Secondary | ICD-10-CM | POA: Diagnosis not present

## 2016-05-27 DIAGNOSIS — Z4802 Encounter for removal of sutures: Secondary | ICD-10-CM | POA: Insufficient documentation

## 2016-05-27 DIAGNOSIS — D631 Anemia in chronic kidney disease: Secondary | ICD-10-CM | POA: Diagnosis not present

## 2016-05-27 DIAGNOSIS — D688 Other specified coagulation defects: Secondary | ICD-10-CM | POA: Diagnosis not present

## 2016-05-27 DIAGNOSIS — R0600 Dyspnea, unspecified: Secondary | ICD-10-CM | POA: Diagnosis not present

## 2016-05-27 DIAGNOSIS — N186 End stage renal disease: Secondary | ICD-10-CM | POA: Diagnosis not present

## 2016-05-27 DIAGNOSIS — T8249XA Other complication of vascular dialysis catheter, initial encounter: Secondary | ICD-10-CM | POA: Diagnosis not present

## 2016-05-28 DIAGNOSIS — K449 Diaphragmatic hernia without obstruction or gangrene: Secondary | ICD-10-CM | POA: Diagnosis not present

## 2016-05-28 DIAGNOSIS — K921 Melena: Secondary | ICD-10-CM | POA: Diagnosis not present

## 2016-05-28 DIAGNOSIS — R195 Other fecal abnormalities: Secondary | ICD-10-CM | POA: Diagnosis not present

## 2016-05-28 DIAGNOSIS — E1122 Type 2 diabetes mellitus with diabetic chronic kidney disease: Secondary | ICD-10-CM | POA: Diagnosis not present

## 2016-05-28 DIAGNOSIS — R569 Unspecified convulsions: Secondary | ICD-10-CM | POA: Diagnosis not present

## 2016-05-28 DIAGNOSIS — K219 Gastro-esophageal reflux disease without esophagitis: Secondary | ICD-10-CM | POA: Diagnosis not present

## 2016-05-28 DIAGNOSIS — I120 Hypertensive chronic kidney disease with stage 5 chronic kidney disease or end stage renal disease: Secondary | ICD-10-CM | POA: Diagnosis not present

## 2016-05-28 DIAGNOSIS — D509 Iron deficiency anemia, unspecified: Secondary | ICD-10-CM | POA: Diagnosis not present

## 2016-05-28 DIAGNOSIS — R198 Other specified symptoms and signs involving the digestive system and abdomen: Secondary | ICD-10-CM | POA: Diagnosis not present

## 2016-05-28 DIAGNOSIS — D500 Iron deficiency anemia secondary to blood loss (chronic): Secondary | ICD-10-CM | POA: Diagnosis not present

## 2016-05-28 DIAGNOSIS — K922 Gastrointestinal hemorrhage, unspecified: Secondary | ICD-10-CM | POA: Diagnosis not present

## 2016-05-28 DIAGNOSIS — R000 Tachycardia, unspecified: Secondary | ICD-10-CM | POA: Diagnosis not present

## 2016-05-28 DIAGNOSIS — Z992 Dependence on renal dialysis: Secondary | ICD-10-CM | POA: Diagnosis not present

## 2016-05-28 DIAGNOSIS — D696 Thrombocytopenia, unspecified: Secondary | ICD-10-CM | POA: Diagnosis not present

## 2016-05-28 DIAGNOSIS — D649 Anemia, unspecified: Secondary | ICD-10-CM | POA: Diagnosis not present

## 2016-05-28 DIAGNOSIS — Z538 Procedure and treatment not carried out for other reasons: Secondary | ICD-10-CM | POA: Diagnosis not present

## 2016-05-28 DIAGNOSIS — E119 Type 2 diabetes mellitus without complications: Secondary | ICD-10-CM | POA: Diagnosis not present

## 2016-05-28 DIAGNOSIS — N186 End stage renal disease: Secondary | ICD-10-CM | POA: Diagnosis not present

## 2016-05-28 DIAGNOSIS — Q211 Atrial septal defect: Secondary | ICD-10-CM | POA: Diagnosis not present

## 2016-05-28 DIAGNOSIS — D62 Acute posthemorrhagic anemia: Secondary | ICD-10-CM | POA: Diagnosis not present

## 2016-05-28 DIAGNOSIS — E669 Obesity, unspecified: Secondary | ICD-10-CM | POA: Diagnosis not present

## 2016-05-28 DIAGNOSIS — R0600 Dyspnea, unspecified: Secondary | ICD-10-CM | POA: Diagnosis not present

## 2016-05-28 DIAGNOSIS — N2581 Secondary hyperparathyroidism of renal origin: Secondary | ICD-10-CM | POA: Diagnosis not present

## 2016-05-28 DIAGNOSIS — T182XXA Foreign body in stomach, initial encounter: Secondary | ICD-10-CM | POA: Diagnosis not present

## 2016-05-28 DIAGNOSIS — I214 Non-ST elevation (NSTEMI) myocardial infarction: Secondary | ICD-10-CM | POA: Diagnosis not present

## 2016-05-28 DIAGNOSIS — I679 Cerebrovascular disease, unspecified: Secondary | ICD-10-CM | POA: Diagnosis not present

## 2016-05-28 DIAGNOSIS — I10 Essential (primary) hypertension: Secondary | ICD-10-CM | POA: Diagnosis not present

## 2016-06-05 ENCOUNTER — Ambulatory Visit: Payer: 59

## 2016-06-10 DIAGNOSIS — T8249XA Other complication of vascular dialysis catheter, initial encounter: Secondary | ICD-10-CM | POA: Diagnosis not present

## 2016-06-10 DIAGNOSIS — Z7901 Long term (current) use of anticoagulants: Secondary | ICD-10-CM | POA: Diagnosis not present

## 2016-06-10 DIAGNOSIS — N186 End stage renal disease: Secondary | ICD-10-CM | POA: Diagnosis not present

## 2016-06-10 DIAGNOSIS — D509 Iron deficiency anemia, unspecified: Secondary | ICD-10-CM | POA: Diagnosis not present

## 2016-06-10 DIAGNOSIS — D631 Anemia in chronic kidney disease: Secondary | ICD-10-CM | POA: Diagnosis not present

## 2016-06-11 ENCOUNTER — Ambulatory Visit: Payer: 59 | Admitting: Occupational Therapy

## 2016-06-12 DIAGNOSIS — N186 End stage renal disease: Secondary | ICD-10-CM | POA: Diagnosis not present

## 2016-06-12 DIAGNOSIS — T8249XA Other complication of vascular dialysis catheter, initial encounter: Secondary | ICD-10-CM | POA: Diagnosis not present

## 2016-06-13 ENCOUNTER — Encounter: Payer: Self-pay | Admitting: Occupational Therapy

## 2016-06-14 DIAGNOSIS — N186 End stage renal disease: Secondary | ICD-10-CM | POA: Diagnosis not present

## 2016-06-14 DIAGNOSIS — T8249XA Other complication of vascular dialysis catheter, initial encounter: Secondary | ICD-10-CM | POA: Diagnosis not present

## 2016-06-14 DIAGNOSIS — E1129 Type 2 diabetes mellitus with other diabetic kidney complication: Secondary | ICD-10-CM | POA: Diagnosis not present

## 2016-06-16 ENCOUNTER — Ambulatory Visit: Payer: 59 | Admitting: Occupational Therapy

## 2016-06-18 ENCOUNTER — Encounter: Payer: Self-pay | Admitting: Occupational Therapy

## 2016-06-18 DIAGNOSIS — E1165 Type 2 diabetes mellitus with hyperglycemia: Secondary | ICD-10-CM | POA: Diagnosis not present

## 2016-06-18 DIAGNOSIS — E1129 Type 2 diabetes mellitus with other diabetic kidney complication: Secondary | ICD-10-CM | POA: Diagnosis not present

## 2016-06-18 DIAGNOSIS — I6789 Other cerebrovascular disease: Secondary | ICD-10-CM | POA: Diagnosis not present

## 2016-06-18 DIAGNOSIS — Z7901 Long term (current) use of anticoagulants: Secondary | ICD-10-CM | POA: Diagnosis not present

## 2016-06-18 DIAGNOSIS — N186 End stage renal disease: Secondary | ICD-10-CM | POA: Diagnosis not present

## 2016-06-18 DIAGNOSIS — D631 Anemia in chronic kidney disease: Secondary | ICD-10-CM | POA: Diagnosis not present

## 2016-06-18 DIAGNOSIS — T8249XA Other complication of vascular dialysis catheter, initial encounter: Secondary | ICD-10-CM | POA: Diagnosis not present

## 2016-06-18 DIAGNOSIS — D509 Iron deficiency anemia, unspecified: Secondary | ICD-10-CM | POA: Diagnosis not present

## 2016-06-18 DIAGNOSIS — G40309 Generalized idiopathic epilepsy and epileptic syndromes, not intractable, without status epilepticus: Secondary | ICD-10-CM | POA: Diagnosis not present

## 2016-06-19 ENCOUNTER — Telehealth: Payer: Self-pay | Admitting: *Deleted

## 2016-06-19 ENCOUNTER — Encounter: Payer: Self-pay | Admitting: Nurse Practitioner

## 2016-06-19 ENCOUNTER — Ambulatory Visit (INDEPENDENT_AMBULATORY_CARE_PROVIDER_SITE_OTHER): Payer: 59 | Admitting: Nurse Practitioner

## 2016-06-19 VITALS — BP 127/69 | HR 62 | Ht 70.0 in | Wt 211.2 lb

## 2016-06-19 DIAGNOSIS — R569 Unspecified convulsions: Secondary | ICD-10-CM

## 2016-06-19 DIAGNOSIS — I69920 Aphasia following unspecified cerebrovascular disease: Secondary | ICD-10-CM

## 2016-06-19 DIAGNOSIS — R531 Weakness: Secondary | ICD-10-CM

## 2016-06-19 DIAGNOSIS — I69351 Hemiplegia and hemiparesis following cerebral infarction affecting right dominant side: Secondary | ICD-10-CM | POA: Diagnosis not present

## 2016-06-19 DIAGNOSIS — I10 Essential (primary) hypertension: Secondary | ICD-10-CM

## 2016-06-19 DIAGNOSIS — Z8673 Personal history of transient ischemic attack (TIA), and cerebral infarction without residual deficits: Secondary | ICD-10-CM | POA: Diagnosis not present

## 2016-06-19 DIAGNOSIS — I639 Cerebral infarction, unspecified: Secondary | ICD-10-CM

## 2016-06-19 MED ORDER — LACOSAMIDE 150 MG PO TABS: 150.0000 mg | ORAL_TABLET | Freq: Two times a day (BID) | ORAL | 5 refills | Status: DC

## 2016-06-19 MED FILL — VIMPAT 150 MG TABLET: 150 | 30 days supply | Qty: 60 | Fill #0

## 2016-06-19 NOTE — Progress Notes (Signed)
Hemoglobin and hematocrit ordered by Dr. Carlis Abbott yesterday to be done at dialysis. Dr. Carlis Abbott will  then  discuss with GI Dr. Michail Sermon regarding  reininstituting  Aspirin.

## 2016-06-19 NOTE — Progress Notes (Signed)
[facility]  PATIENT: Isaiah [name] DOB: Jul 02, 1966   REASON FOR VISIT: Follow-up for recent hospitalization for seizure and GI bleeds in [place] HISTORY FROM: Patient wife and mom    HISTORY OF PRESENT ILLNESS HISTORY PS:Isaiah [name] is a 50 year old African-American gentleman seen today for first office follow-up visit following hospital consultation with Dr. Erlinda Hong in Oct 23, 2015. He is accompanied by his wife. He has a complicated PMH of DM type 2 with diabetic neuropathy, Sickle cell-thalassemia,  DVT and PE x 2 in 07/2011 and 1/ 2016 off coumadin due to GIB, OSA, CKD stage V, MGUS and anemia .He was recently admitted to [facility] in [place] on 09/16/15 with N/V and blood stools with acute on chronic renal failure due to acute pancreatitis. He was treated with supportive care, IV bicarb as well as multiple units PRBC and EGD with severe stage IV erosive esophagitis with gastritis.HD initiated on 01/09 due to poor recover with decrease in UOP. He developed hypoxic respiratory failure and was found to have RUL/RML PE as well as left popliteal DVT. He was treated with IV heparin. IVC filter was placed around 09/21/15 and post procedure was found to have right hand weakness. CT head on 09/26/15 reported possible subacut right MCA/PCA and left cerebellar infarcts which were not correlate to his right hand weakness. Neurology was consulted for input and EMG showed evidence of profound neuropathy. He had worsening of symptoms with increase in right sided weakness, facial droop speech difficulty on 09/27/15. MRI on 09/29/15 showedscattered right MCA and left MCA, left thalamus and b/l cerebellar infarcts. The infarcts on CT 09/26/15 likely to be acute too. On 09/30/15, he had worsening of aphasia and repeat MRI showed extension of left MCA frontal infarcts. MRA head showed left M2 superior branch occlusion. Echo with positive bubble study and TEE  positive for PFO. Hypercoagulation studies reported to be negative. IV heparin was changed to Lovenox due to recurrent embolic strokes on heparin.  He was then transferred here to [facility] rehab on 10/12/15. He was reported to have small amount of blood in stool on admission. Continued HD for CKD after admission. Due to CKD on HD, his lovenox discontinued and put back on heparin IV. His hematologist Dr. Alen Blew was consulted for anticoagulation use. He commented "As far as long-term anticoagulation, I feel be reasonable to consider aspirin alone and hope that IVC filter will prevent any future embolic strokes from occurring. An alternative strategy is to consider warfarin and follow closely for signs or symptoms of bleeding." since admission, Patient made significant improvement on his aphasia and right side weakness, and has been able to walk with assistance and speak much more fluent. His H&H  remained stable and no obvious bleeding reported. Dr. Erlinda Hong recommended starting warfarin. Patient has tolerated warfarin well without any more episodes of bleeding and hematocrit has remained stable. He is currently participating in outpatient physical, occupational and speech therapy and making good improvement. His able to states speaks short sentences now with some word hesitancy. He still has significant weakness in his right hand and grip but is able to walk with a cane. His balance is good is had no major falls. He does complain of pain and stiffness in his legs and hand particularly at night at times he has to wake up because of discomfort. He is being followed in the rehabilitation clinic but currently is not taking any medications for spasticity. His sugars have all been under  good control. Update 03/25/2016 PS: He returns for follow-up after last visit with me 3 months ago. He is accompanied today by his son. He was hospitalized on 4/312017 with a seizure. He was started on Vimpat 100 twice daily. EEG showed generalized  slowing without definite epileptiform activity. Limited MRI scan the brain was obtained on 12/3115 which showed multiple late subacute to chronic infarcts and her repeat MRI on 5/117 showed resolution and no definite new acute infarct. Patient was initially felt to be candidate for rehabilitation but after prolonged postictal phase for several days he made rapid improvement and was discharged home. Doing well. His able to walk without assistance. His 2 has mild expressive aphasia but speech is improving. Still has mild right-sided weakness. He walks with dragging his right leg. Is tolerating Vimpat well without side effects. He remains on warfarin and is tolerating it well without bleeding or bruising. He states his fasting sugars 7 well controlled. He has no new complaints. UPDATE 10/12/2017[name], 50 year old male returns for follow-up. With medical history of right and left  CVA in January 2017 end-stage renal disease previous pulmonary emboli, seizure disorder ,diabetes, hypertension previously on Coumadin. He was on vacation in [place] in September and admitted on 05/28/16 after a dialysis treatment he had a seizure event and was hospitalized he was also found to be anemic with a hemoglobin of 6.8 indicating positive stools in the emergency room he was admitted for complete workup. He received multiple blood transfusions.  He received a loading dose of Keppra in the emergency room and his Vimpat dose was increased to 150 mg twice daily. EEG performed was mildly abnormal due to the presence of mild intermittent generalized theta slowing which suggest mild encephalopathy of nonspecific etiology. He was hospital for 12 days and return to [place]. He denies further seizure events. He denies further stroke or TIA symptoms continues to aspirin mild expressive aphasia and mild right-sided weakness. He returns for reevaluation.   REVIEW OF SYSTEMS: Full 14 system review of systems performed and notable  only for those listed, all others are neg:  Constitutional: neg  Cardiovascular: neg Ear/Nose/Throat: neg  Skin: neg Eyes: neg Respiratory: neg Gastroitestinal: neg  Hematology/Lymphatic: neg  Endocrine: neg Musculoskeletal:neg Allergy/Immunology: neg Neurological: Seizure, speech difficulty Psychiatric: neg Sleep : neg   ALLERGIES: Allergies  Allergen Reactions  . Codeine Rash and Other (See Comments)    Unknown reaction (patient says it was more serious than just a rash, but he can't remember what happened) Unknown reaction (patient says it was more serious than just a rash, but he can't remember what happened)    HOME MEDICATIONS: Outpatient Medications Prior to Visit  Medication Sig Dispense Refill  . albuterol (PROAIR HFA) 108 (90 Base) MCG/ACT inhaler Inhale two puffs every 4-6 hours if needed for cough or wheeze 1 Inhaler 1  . epoetin alfa (EPOGEN,PROCRIT) 97588 UNIT/ML injection Inject 5,000 Units into the skin once a week. Reported on 01/17/2016    . fluticasone furoate-vilanterol (BREO ELLIPTA) 100-25 MCG/INH AEPB Inhale one puff once daily to prevent cough or wheeze 60 each 5  . Insulin Glargine (LANTUS) 100 UNIT/ML Solostar Pen Inject 10 Units into the skin daily at 10 pm. (Patient taking differently: Inject 25 Units into the skin daily at 10 pm. ) 15 mL 1  . lidocaine-prilocaine (EMLA) cream as needed.   10  . metoprolol tartrate (LOPRESSOR) 25 MG tablet Take 0.5 tablets (12.5 mg total) by mouth 2 (two) times daily.  60 tablet 0  . midodrine (PROAMATINE) 2.5 MG tablet Take 1 tablet (2.5 mg total) by mouth 2 (two) times daily with a meal. 60 tablet 1  . multivitamin (RENA-VIT) TABS tablet Take 1 tablet by mouth at bedtime. 30 tablet 1  . NEEDLE, DISP, 27 G (BD DISP NEEDLES) 27G X 1/2" MISC 1 application by Does not apply route daily after supper. 100 each 0  . pantoprazole (PROTONIX) 40 MG tablet Take 1 tablet (40 mg total) by mouth 2 (two) times daily. 60 tablet 1  .  UNIFINE PENTIPS 31G X 8 MM MISC   99  . aspirin EC 81 MG tablet Take 1 tablet (81 mg total) by mouth daily. (Patient not taking: Reported on 06/19/2016) 100 tablet 0  . cinacalcet (SENSIPAR) 60 MG tablet Take by mouth.    . warfarin (COUMADIN) 5 MG tablet Take 1.5 tablets (7.5 mg total) by mouth daily at 6 PM. (Patient not taking: Reported on 06/19/2016) 45 tablet 1  . Lacosamide (VIMPAT) 100 MG TABS Take 1 tablet (100 mg total) by mouth 2 (two) times daily. 28 tablet 0   No facility-administered medications prior to visit.     PAST MEDICAL HISTORY: Past Medical History:  Diagnosis Date  . A-fib ([place])   . Acute renal failure ([place]) 07/2011  . Anemia   . Asthma   . Diabetes mellitus without complication ([place])   . ESRD (end stage renal disease) on dialysis ([place])   . Gout   . History of recent blood transfusion 10/27/14   2 Units PRBC's  . Hyperkalemia 07/2011  . Hypertension   . Monoclonal gammopathies   . Monoclonal gammopathy   . OSA on CPAP   . Pulmonary embolism ([place]) 07/2011   a. Tx with Coumadin for 6 months (unknown cause per patient).  . Pulmonary embolism ([place]) 07/2011; 09/27/2014   a. Bilat PE 07/2011 - unclear cause, tx with 6 months Coumadin.;   . Seizures ([place])   . Sickle cell-thalassemia disease ([place])    a. Sickle cell trait.  . Sleep apnea    a. uses CPAP.  [name] Stroke [facility])     PAST SURGICAL HISTORY: Past Surgical History:  Procedure Laterality Date  . AV FISTULA PLACEMENT Right 12/05/2015   Procedure: INSERTION OF ARTERIOVENOUS (AV) GORE-TEX GRAFT ARM;  Surgeon: Elam Dutch, MD;  Location: [facility];  Service: Vascular;  Laterality: Right;  . BONE MARROW BIOPSY    . CHOLECYSTECTOMY    . CHOLECYSTECTOMY  1990's?  . EYE SURGERY Right   . INSERTION OF DIALYSIS CATHETER Right 10/16/2015   Procedure: INSERTION OF PALINDROME DIALYSIS CATHETER ;  Surgeon: Elam Dutch, MD;  Location: [facility];  Service: Vascular;  Laterality: Right;  . OTHER SURGICAL HISTORY      Retinal surgery  . PARS PLANA VITRECTOMY  02/17/2012   Procedure: PARS PLANA VITRECTOMY WITH 25 GAUGE;  Surgeon: Hayden Pedro, MD;  Location: [facility];  Service: Ophthalmology;  Laterality: Right;  . PERIPHERAL VASCULAR CATHETERIZATION N/A 03/20/2016   Procedure: A/V Shuntogram/Fistulagram;  Surgeon: Conrad Annville, MD;  Location: [facility];  Service: Cardiovascular;  Laterality: N/A;    FAMILY HISTORY: Family History  Problem Relation Age of Onset  . Hypertension Mother   . Diabetes Mother   . Stroke Father   . Sickle cell anemia Brother   . Diabetes type I Brother   . Kidney disease Brother     SOCIAL HISTORY: Social History  Social History  . Marital status: Married    Spouse name: sylvia  . Number of children: 1  . Years of education: college   Occupational History  . [place] History Main Topics  . Smoking status: Never Smoker  . Smokeless tobacco: Never Used  . Alcohol use No  . Drug use: No  . Sexual activity: Yes   Other Topics Concern  . Not on file   Social History Narrative   ** Merged History Encounter **          Vitals:   06/19/16 1519  BP: 127/69  Pulse: 62  Weight: 211 lb 3.2 oz (95.8 kg)  Height: _0  (1.778 m)   Body mass index is 30.3 kg/m. General: well developed, well nourished, seated, in no evident distress Head: head normocephalic and atraumatic.  Neck: supple with no carotid or supraclavicular bruits Cardiovascular: regular rate and rhythm, no murmurs Musculoskeletal: no deformity Skin:  no rash/petichiae Vascular:  Normal pulses all extremities   Neurological examination  Mental Status: Awake and fully alert. Oriented to place and time. Recent and remote memory intact. Attention span, concentration and fund of knowledge Is slightly impaired. Mood and affect appropriate. Mild expressive aphasia with word finding difficulties and dysfluency. Good comprehension, naming and repetition. Cranial  Nerves: Fundoscopic exam not done.  . Pupils equal, briskly reactive to light. Extraocular movements full without nystagmus. Visual fields full to confrontation. Hearing intact. Moderate right lower facial weakness. Facial sensation intact. Face, tongue, palate moves normally and symmetrically.  Motor: Spastic right hemiparesis with 4/5 right shoulder and elbow strength with significant weakness of the right grip and intrinsic hand muscles. 4/5 right lower extremity strength with spasticity and increased tone. Ankle dorsiflexors week. Sensory.:   touch ,pinprick .position and vibratory sensation slightly diminished on the right compared to the left.  Coordination: Impaired on the right side and normal on the left  Gait and Station: Arises from chair without difficulty. Spastic hemiplegic gait  Reflexes: 1+ and symmetric. Toes downgoing.    DIAGNOSTIC DATA (LABS, IMAGING, TESTING) - I reviewed patient records, labs, notes, testing and imaging myself where available.  Lab Results  Component Value Date   WBC 13.0 (H) 04/08/2016   HGB 11.1 (L) 04/08/2016   HCT 33.8 (L) 04/08/2016   MCV 80.9 04/08/2016   PLT 485 (H) 04/08/2016      Component Value Date/Time   NA 135 (L) 04/08/2016 0918   K 3.7 04/08/2016 0918   CL 96 (L) 03/20/2016 0633   CO2 25 04/08/2016 0918   GLUCOSE 94 04/08/2016 0918   BUN 57.8 (H) 04/08/2016 0918   CREATININE 9.7 (HH) 04/08/2016 0918   CALCIUM 9.9 04/08/2016 0918   PROT 10.2 (H) 04/08/2016 0918   PROT 9.5 (H) 04/08/2016 0918   ALBUMIN 3.9 04/08/2016 0918   AST 18 04/08/2016 0918   ALT 12 04/08/2016 0918   ALKPHOS 103 04/08/2016 0918   BILITOT 0.89 04/08/2016 0918   GFRNONAA 9 (L) 01/11/2016 0400   GFRAA 11 (L) 01/11/2016 0400   Lab Results  Component Value Date   CHOL 128 10/24/2015   HDL 48 10/24/2015   LDLCALC 62 10/24/2015   TRIG 88 10/24/2015   CHOLHDL 2.7 10/24/2015   Lab Results  Component Value Date   HGBA1C 4.4 (L) 01/06/2016   Lab  Results  Component Value Date   PJASNKNL97 673 10/24/2015   Lab Results  Component Value Date   TSH 0.896  01/06/2016     ASSESSMENT AND PLAN 50 year old African-American male with bilateral anterior and posterior circulation embolic infarcts in January 2017 secondary to paradoxical embolism from deep vein thrombosis, pulmonary embolism and patent foramen ovale while he was off Coumadin due to GI bleeding with prolonged hospitalization in [facility] but was transferred here for rehabilitation. Repeat admission in April 2017 for seizure. He has residual expressive aphasia and   spastic right hemiparesis . Vascular risk factors of diabetes, hypertension, sleep apnea. Repeat hospitalization in September 2017 [place] for seizure events after dialysis and GI bleed. Hospital record reviewed 109 pages.  Blood pressure well controlled today 127/79 Continue Vimpat 150 twice daily will refill Resume occupational therapy and speech therapy Off aspirin and Coumadin at present due to recent GI bleed requiring multiple blood transfusions Please remember, common seizure triggers are: Sleep deprivation, dehydration, overheating, stress, hypoglycemia or skipping meals, certain medications or excessive alcohol use, especially stopping alcohol abruptly if you have had heavy alcohol use before (aka alcohol withdrawal seizure). If you have a prolonged seizure over 2-5 minutes or back to back seizures, call or have someone call 911 or take you to the nearest emergency room. You cannot drive a car or operate any other machinery or vehicle within 6 months of a seizure. Please do not swim alone or take a tub bath for safety. Do not cook with large quantities of boiling water or oil for safety. Take your medicine for seizure prevention regularly and do not skip doses or stop medication abruptly and tone are told to do so by your healthcare provider. Try to get a refill on your antiepileptic medication ahead of time,  so you are not at risk of running out. If you run out of the seizure medication and do not have a refill at hand she may run into medication withdrawal seizures. Avoid taking Wellbutrin, narcotic pain medications and tramadol, as they can lower seizure threshold Greater than 50% of time during this 45 minute visit was spent on counseling,explanation and education of diagnosis, planning of further management, discussion with patient and coordination of care and reviewing most recent hospital record.  F/U in 3 months Dennie Bible, [facility], [facility], APRN  [facility] [address] [place], [place] [address] [phone]

## 2016-06-19 NOTE — Patient Instructions (Addendum)
Blood pressure well controlled today 127/79 Continue Vimpat 150 twice daily will refill Resume occupational therapy and speech therapy Please remember, common seizure triggers are: Sleep deprivation, dehydration, overheating, stress, hypoglycemia or skipping meals, certain medications or excessive alcohol use, especially stopping alcohol abruptly if you have had heavy alcohol use before (aka alcohol withdrawal seizure). If you have a prolonged seizure over 2-5 minutes or back to back seizures, call or have someone call 911 or take you to the nearest emergency room. You cannot drive a car or operate any other machinery or vehicle within 6 months of a seizure. Please do not swim alone or take a tub bath for safety. Do not cook with large quantities of boiling water or oil for safety. Take your medicine for seizure prevention regularly and do not skip doses or stop medication abruptly and tone are told to do so by your healthcare provider. Try to get a refill on your antiepileptic medication ahead of time, so you are not at risk of running out. If you run out of the seizure medication and do not have a refill at hand she may run into medication withdrawal seizures. Avoid taking Wellbutrin, narcotic pain medications and tramadol, as they can lower seizure threshold.  F/U in 3 months

## 2016-06-19 NOTE — Telephone Encounter (Signed)
Received fax confirmation of vimpat [facility] outpt pharm.

## 2016-06-20 DIAGNOSIS — E1129 Type 2 diabetes mellitus with other diabetic kidney complication: Secondary | ICD-10-CM | POA: Diagnosis not present

## 2016-06-20 DIAGNOSIS — N186 End stage renal disease: Secondary | ICD-10-CM | POA: Diagnosis not present

## 2016-06-20 DIAGNOSIS — D509 Iron deficiency anemia, unspecified: Secondary | ICD-10-CM | POA: Diagnosis not present

## 2016-06-20 DIAGNOSIS — T8249XA Other complication of vascular dialysis catheter, initial encounter: Secondary | ICD-10-CM | POA: Diagnosis not present

## 2016-06-20 DIAGNOSIS — D631 Anemia in chronic kidney disease: Secondary | ICD-10-CM | POA: Diagnosis not present

## 2016-06-23 ENCOUNTER — Ambulatory Visit: Payer: 59 | Attending: Physical Medicine & Rehabilitation | Admitting: *Deleted

## 2016-06-23 DIAGNOSIS — R482 Apraxia: Secondary | ICD-10-CM | POA: Diagnosis not present

## 2016-06-23 DIAGNOSIS — R471 Dysarthria and anarthria: Secondary | ICD-10-CM | POA: Diagnosis not present

## 2016-06-23 DIAGNOSIS — R41841 Cognitive communication deficit: Secondary | ICD-10-CM | POA: Insufficient documentation

## 2016-06-23 NOTE — Therapy (Signed)
[facility] [address] [place], [place], [address] Phone: [phone]   Fax:  [phone]  Speech Language Pathology Treatment  Patient Details  Name: Isaiah [name] [facility] MRN: [id number] Date of Birth: 1965-11-01 Referring Provider: Alysia Penna, MD  Encounter Date: 06/23/2016      End of Session - 06/23/16 1112    Visit Number 14   Number of Visits 20   SLP Start Time 3267   SLP Stop Time  1100   SLP Time Calculation (min) 45 min   Activity Tolerance Patient tolerated treatment well      Past Medical History:  Diagnosis Date  . A-fib ([place])   . Acute renal failure ([place]) 07/2011  . Anemia   . Asthma   . Diabetes mellitus without complication ([place])   . ESRD (end stage renal disease) on dialysis ([place])   . Gout   . History of recent blood transfusion 10/27/14   2 Units PRBC's  . Hyperkalemia 07/2011  . Hypertension   . Monoclonal gammopathies   . Monoclonal gammopathy   . OSA on CPAP   . Pulmonary embolism ([place]) 07/2011   a. Tx with Coumadin for 6 months (unknown cause per patient).  . Pulmonary embolism ([place]) 07/2011; 09/27/2014   a. Bilat PE 07/2011 - unclear cause, tx with 6 months Coumadin.;   . Seizures ([place])   . Sickle cell-thalassemia disease ([place])    a. Sickle cell trait.  . Sleep apnea    a. uses CPAP.  [name] Stroke [facility])     Past Surgical History:  Procedure Laterality Date  . AV FISTULA PLACEMENT Right 12/05/2015   Procedure: INSERTION OF ARTERIOVENOUS (AV) GORE-TEX GRAFT ARM;  Surgeon: Elam Dutch, MD;  Location: [facility];  Service: Vascular;  Laterality: Right;  . BONE MARROW BIOPSY    . CHOLECYSTECTOMY    . CHOLECYSTECTOMY  1990's?  . EYE SURGERY Right   . INSERTION OF DIALYSIS CATHETER Right 10/16/2015   Procedure: INSERTION OF PALINDROME DIALYSIS CATHETER ;  Surgeon: Elam Dutch, MD;  Location: [facility];  Service: Vascular;  Laterality: Right;  . OTHER SURGICAL HISTORY     Retinal  surgery  . PARS PLANA VITRECTOMY  02/17/2012   Procedure: PARS PLANA VITRECTOMY WITH 25 GAUGE;  Surgeon: Hayden Pedro, MD;  Location: [facility];  Service: Ophthalmology;  Laterality: Right;  . PERIPHERAL VASCULAR CATHETERIZATION N/A 03/20/2016   Procedure: A/V Shuntogram/Fistulagram;  Surgeon: Conrad Sultana, MD;  Location: [facility];  Service: Cardiovascular;  Laterality: N/A;    There were no vitals filed for this visit.      Subjective Assessment - 06/23/16 1117    Subjective "I get tangled up sometimes."   Patient is accompained by: Family member  mother   Currently in Pain? No/denies               ADULT SLP TREATMENT - 06/23/16 1030      General Information   Behavior/Cognition Alert;Cooperative;Pleasant mood     Treatment Provided   Treatment provided Cognitive-Linquistic     Cognitive-Linquistic Treatment   Treatment focused on Cognition   Skilled Treatment Pt with self-monitoring to correct intelligibility errors in conversational speech. He discussed personal emphasis on slowing down.  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 Pt benefited from increased time and breaking task down into single steps.     Assessment / Recommendations / Plan   Plan Continue with current  plan of care     Progression Toward Goals   Progression toward goals Progressing toward goals          SLP Education - 06/23/16 1111    Education provided Yes   Education Details Using finger tracking with reading.   Person(s) Educated Patient   Methods Explanation   Comprehension Returned demonstration;Verbalized understanding          SLP Short Term Goals - 06/23/16 1129      SLP SHORT TERM GOAL #3   Title pt will demo speech 95% accurate in sentence-length responses with modified independence (compensations) over 3 sessions   Baseline 10/16   Status Partially Met     SLP SHORT TERM GOAL #5   Time 3   Status Not Met          SLP  Long Term Goals - 06/23/16 1130      SLP LONG TERM GOAL #1   Title pt will demo functional speech in 20 minutes mod complex/complex conversation with modified independnence   Time 6   Period --  visits   Status Achieved     SLP LONG TERM GOAL #4   Title pt will demo functional organizational/sequencing ability for simple cognitive communication tasks   Time 7   Period --  visits   Status On-going          Plan - 06/23/16 1118    Clinical Impression Statement Pt with improved speech intelligibility and self-monitoring. Focus continues on cognitive limitations.   Speech Therapy Frequency 2x / week   Duration --  6 more visits   Treatment/Interventions SLP instruction and feedback;Compensatory strategies;Internal/external aids;Oral motor exercises;Patient/family education;Functional tasks;Cueing hierarchy;Cognitive reorganization   Potential to Achieve Goals Fair   Potential Considerations Severity of impairments   Consulted and Agree with Plan of Care Patient      Patient will benefit from skilled therapeutic intervention in order to improve the following deficits and impairments:   Cognitive communication deficit    Problem List Patient Active Problem List   Diagnosis Date Noted  . Hemiparesis affecting right side as late effect of cerebrovascular accident ([place]) 02/28/2016  . Aphasia, late effect of cerebrovascular disease 02/28/2016  . Acute encephalopathy 01/08/2016  . Seizures ([place]) 01/08/2016  . History of ischemic left MCA stroke 01/08/2016  . Right sided weakness 01/08/2016  . Chronic combined systolic and diastolic CHF (congestive heart failure) ([place]) 01/08/2016  . Atrial fibrillation ([place]) 01/08/2016  . Leukocytosis 01/08/2016  . Benign essential HTN 01/08/2016  . Anemia of chronic disease 01/08/2016  . Controlled type 2 diabetes mellitus with diabetic nephropathy, without long-term current use of insulin ([place]) 01/08/2016  . History of DVT (deep vein  thrombosis) 01/08/2016  . ESRD (end stage renal disease) on dialysis [facility])     Vinetta Bergamo MA, [place] 06/23/2016, 11:36 AM  [facility] [address] [place], [place], [address] Phone: [phone]   Fax:  [phone]   Name: Kaya Klausing [facility] MRN: [id number] Date of Birth: 12-Dec-1965

## 2016-06-23 NOTE — Patient Instructions (Signed)
Pt to use coins to practice counting for homework.

## 2016-06-24 ENCOUNTER — Ambulatory Visit (HOSPITAL_COMMUNITY): Admission: RE | Admit: 2016-06-24 | Payer: 59 | Source: Ambulatory Visit | Admitting: Gastroenterology

## 2016-06-24 ENCOUNTER — Encounter (HOSPITAL_COMMUNITY): Admission: RE | Payer: Self-pay | Source: Ambulatory Visit

## 2016-06-24 SURGERY — ESOPHAGOGASTRODUODENOSCOPY (EGD) WITH PROPOFOL
Anesthesia: Monitor Anesthesia Care

## 2016-06-26 NOTE — Progress Notes (Signed)
I agree with the above plan 

## 2016-06-27 ENCOUNTER — Ambulatory Visit: Payer: 59

## 2016-06-27 DIAGNOSIS — R482 Apraxia: Secondary | ICD-10-CM

## 2016-06-27 DIAGNOSIS — R471 Dysarthria and anarthria: Secondary | ICD-10-CM | POA: Diagnosis not present

## 2016-06-27 DIAGNOSIS — N186 End stage renal disease: Secondary | ICD-10-CM | POA: Diagnosis not present

## 2016-06-27 DIAGNOSIS — T8249XA Other complication of vascular dialysis catheter, initial encounter: Secondary | ICD-10-CM | POA: Diagnosis not present

## 2016-06-27 DIAGNOSIS — R41841 Cognitive communication deficit: Secondary | ICD-10-CM | POA: Diagnosis not present

## 2016-06-27 DIAGNOSIS — E1129 Type 2 diabetes mellitus with other diabetic kidney complication: Secondary | ICD-10-CM | POA: Diagnosis not present

## 2016-06-27 DIAGNOSIS — D509 Iron deficiency anemia, unspecified: Secondary | ICD-10-CM | POA: Diagnosis not present

## 2016-06-27 NOTE — Therapy (Signed)
[facility] [address] [place], [place], [address] Phone: [phone]   Fax:  [phone]  Speech Language Pathology Treatment  Patient Details  Name: Isaiah [name] [facility] MRN: [id number] Date of Birth: 11-04-1965 Referring Provider: Alysia Penna, MD  Encounter Date: 06/27/2016      End of Session - 06/27/16 1224    Visit Number 15   Number of Visits 23   Date for SLP Re-Evaluation 09/05/16   Authorization Type renewed 06-27-16, for week of 06-30-16   SLP Start Time 1016   SLP Stop Time  1100   SLP Time Calculation (min) 44 min   Activity Tolerance Patient tolerated treatment well      Past Medical History:  Diagnosis Date  . A-fib ([place])   . Acute renal failure ([place]) 07/2011  . Anemia   . Asthma   . Diabetes mellitus without complication ([place])   . ESRD (end stage renal disease) on dialysis ([place])   . Gout   . History of recent blood transfusion 10/27/14   2 Units PRBC's  . Hyperkalemia 07/2011  . Hypertension   . Monoclonal gammopathies   . Monoclonal gammopathy   . OSA on CPAP   . Pulmonary embolism ([place]) 07/2011   a. Tx with Coumadin for 6 months (unknown cause per patient).  . Pulmonary embolism ([place]) 07/2011; 09/27/2014   a. Bilat PE 07/2011 - unclear cause, tx with 6 months Coumadin.;   . Seizures ([place])   . Sickle cell-thalassemia disease ([place])    a. Sickle cell trait.  . Sleep apnea    a. uses CPAP.  [name] Stroke [facility])     Past Surgical History:  Procedure Laterality Date  . AV FISTULA PLACEMENT Right 12/05/2015   Procedure: INSERTION OF ARTERIOVENOUS (AV) GORE-TEX GRAFT ARM;  Surgeon: Elam Dutch, MD;  Location: [facility];  Service: Vascular;  Laterality: Right;  . BONE MARROW BIOPSY    . CHOLECYSTECTOMY    . CHOLECYSTECTOMY  1990's?  . EYE SURGERY Right   . INSERTION OF DIALYSIS CATHETER Right 10/16/2015   Procedure: INSERTION OF PALINDROME DIALYSIS CATHETER ;  Surgeon: Elam Dutch, MD;   Location: [facility];  Service: Vascular;  Laterality: Right;  . OTHER SURGICAL HISTORY     Retinal surgery  . PARS PLANA VITRECTOMY  02/17/2012   Procedure: PARS PLANA VITRECTOMY WITH 25 GAUGE;  Surgeon: Hayden Pedro, MD;  Location: [facility];  Service: Ophthalmology;  Laterality: Right;  . PERIPHERAL VASCULAR CATHETERIZATION N/A 03/20/2016   Procedure: A/V Shuntogram/Fistulagram;  Surgeon: Conrad Amenia, MD;  Location: [facility];  Service: Cardiovascular;  Laterality: N/A;    There were no vitals filed for this visit.      Subjective Assessment - 06/27/16 1028    Subjective "I went out of town - I was in [place], for about 2 weeks or so."               ADULT SLP TREATMENT - 06/27/16 1029      General Information   Behavior/Cognition Alert;Cooperative;Pleasant mood     Treatment Provided   Treatment provided Cognitive-Linquistic     Pain Assessment   Pain Assessment No/denies pain     Cognitive-Linquistic Treatment   Treatment focused on Cognition   Skilled Treatment Pt reports it takes a long time "to think about things" - e.g., - pt exampled last ST session with making coin combinations from change amounts called verbally. SLP and pt talked  about what pt would like focus on - pt chose his cognition instead of speech accuracy. With simple 5-step written sequences, pt req'd significant extra time. SLP assessed situation and had pt underline main words in each sentence - using this strategy pt still req'd extra time but amount of time was approx cut in half. "It was easier to pinpoint the things to sequence," pt stated.      Assessment / Recommendations / Plan   Plan Continue with current plan of care     Progression Toward Goals   Progression toward goals Progressing toward goals          SLP Education - 06/27/16 1223    Education provided Yes   Education Details underline important keys when reading   Person(s) Educated Patient   Methods Explanation    Comprehension Verbalized understanding;Returned demonstration          SLP Short Term Goals - 06/23/16 1129      SLP SHORT TERM GOAL #3   Title pt will demo speech 95% accurate in sentence-length responses with modified independence (compensations) over 3 sessions   Baseline 10/16   Status Partially Met     SLP SHORT TERM GOAL #5   Time 3   Status Not Met          SLP Long Term Goals - 06/27/16 1229      SLP LONG TERM GOAL #1   Title pt will demo functional speech in 20 minutes mod complex/complex conversation with modified independnence   Time 5   Period --  visits   Status Achieved     SLP LONG TERM GOAL #2   Title pt will demo HEP for speech-motor planning indpendently over 3 total sessions    Status Deferred  due to pt's desired work with cognition     SLP LONG TERM GOAL #3   Title pt will demo HEP for dysarthria independently over 5 total sessions   Status Deferred  due to pt desired focus on cognition     SLP LONG TERM GOAL #4   Title pt will demo functional organizational/sequencing ability for simple cognitive communication tasks   Time 5   Period --  visits   Status On-going     SLP LONG TERM GOAL #5   Title pt will demo evidence of memory system initiated   Time 5   Period --  visits   Status On-going          Plan - 06/27/16 1228    Clinical Impression Statement Pt with improved speech intelligibility and self-monitoring. Focus continues on cognitive limitations, per pt desire. See LTG update for details re: goals met/not met. Pt would cont to benefit from skilled ST to incr pt independence. At this time, pt is not able to return to work due to the extent of his cognitive-lingusitic deficits.   Speech Therapy Frequency 2x / week   Duration 4 weeks  or 8 more visits   Treatment/Interventions SLP instruction and feedback;Compensatory strategies;Internal/external aids;Oral motor exercises;Patient/family education;Functional tasks;Cueing  hierarchy;Cognitive reorganization   Potential to Achieve Goals Fair   Potential Considerations Severity of impairments   Consulted and Agree with Plan of Care Patient      Patient will benefit from skilled therapeutic intervention in order to improve the following deficits and impairments:   Cognitive communication deficit  Dysarthria and anarthria  Apraxia    Problem List Patient Active Problem List   Diagnosis Date Noted  . Hemiparesis affecting  right side as late effect of cerebrovascular accident ([place]) 02/28/2016  . Aphasia, late effect of cerebrovascular disease 02/28/2016  . Acute encephalopathy 01/08/2016  . Seizures ([place]) 01/08/2016  . History of ischemic left MCA stroke 01/08/2016  . Right sided weakness 01/08/2016  . Chronic combined systolic and diastolic CHF (congestive heart failure) ([place]) 01/08/2016  . Atrial fibrillation ([place]) 01/08/2016  . Leukocytosis 01/08/2016  . Benign essential HTN 01/08/2016  . Anemia of chronic disease 01/08/2016  . Controlled type 2 diabetes mellitus with diabetic nephropathy, without long-term current use of insulin ([place]) 01/08/2016  . History of DVT (deep vein thrombosis) 01/08/2016  . ESRD (end stage renal disease) on dialysis [facility])     [facility] ,[place], [place]  06/27/2016, 12:46 PM  [facility] [address] [place], [place], [address] Phone: [phone]   Fax:  [phone]   Name: Isaiah [name] [facility] MRN: [id number] Date of Birth: [birth year]

## 2016-06-27 NOTE — Patient Instructions (Signed)
  Please complete the assigned speech therapy homework and return it to your next session.

## 2016-06-30 ENCOUNTER — Ambulatory Visit: Payer: 59

## 2016-06-30 DIAGNOSIS — D631 Anemia in chronic kidney disease: Secondary | ICD-10-CM | POA: Diagnosis not present

## 2016-06-30 DIAGNOSIS — E1129 Type 2 diabetes mellitus with other diabetic kidney complication: Secondary | ICD-10-CM | POA: Diagnosis not present

## 2016-06-30 DIAGNOSIS — R482 Apraxia: Secondary | ICD-10-CM

## 2016-06-30 DIAGNOSIS — R471 Dysarthria and anarthria: Secondary | ICD-10-CM

## 2016-06-30 DIAGNOSIS — R41841 Cognitive communication deficit: Secondary | ICD-10-CM

## 2016-06-30 DIAGNOSIS — N186 End stage renal disease: Secondary | ICD-10-CM | POA: Diagnosis not present

## 2016-06-30 DIAGNOSIS — D509 Iron deficiency anemia, unspecified: Secondary | ICD-10-CM | POA: Diagnosis not present

## 2016-06-30 DIAGNOSIS — Z7901 Long term (current) use of anticoagulants: Secondary | ICD-10-CM | POA: Diagnosis not present

## 2016-06-30 DIAGNOSIS — T8249XA Other complication of vascular dialysis catheter, initial encounter: Secondary | ICD-10-CM | POA: Diagnosis not present

## 2016-06-30 NOTE — Therapy (Signed)
[facility] [address] [place], [place], [address] Phone: [phone]   Fax:  [phone]  Speech Language Pathology Treatment  Patient Details  Name: Isaiah [name]  [name] MRN: [id number] Date of Birth: [birth year] Referring Provider: Alysia Penna, MD  Encounter Date: 06/30/2016      End of Session - 06/30/16 1157    Visit Number 16   Number of Visits 23   Date for SLP Re-Evaluation 09/05/16   Authorization Type renewed 06-27-16, for week of 06-30-16   SLP Start Time 1104   SLP Stop Time  1145   SLP Time Calculation (min) 41 min   Activity Tolerance Patient tolerated treatment well      Past Medical History:  Diagnosis Date  . A-fib ([place])   . Acute renal failure ([place]) 07/2011  . Anemia   . Asthma   . Diabetes mellitus without complication ([place])   . ESRD (end stage renal disease) on dialysis ([place])   . Gout   . History of recent blood transfusion 10/27/14   2 Units PRBC's  . Hyperkalemia 07/2011  . Hypertension   . Monoclonal gammopathies   . Monoclonal gammopathy   . OSA on CPAP   . Pulmonary embolism ([place]) 07/2011   a. Tx with Coumadin for 6 months (unknown cause per patient).  . Pulmonary embolism ([place]) 07/2011; 09/27/2014   a. Bilat PE 07/2011 - unclear cause, tx with 6 months Coumadin.;   . Seizures ([place])   . Sickle cell-thalassemia disease ([place])    a. Sickle cell trait.  . Sleep apnea    a. uses CPAP.  [name] Stroke [facility])     Past Surgical History:  Procedure Laterality Date  . AV FISTULA PLACEMENT Right 12/05/2015   Procedure: INSERTION OF ARTERIOVENOUS (AV) GORE-TEX GRAFT ARM;  Surgeon: Elam Dutch, MD;  Location: [facility];  Service: Vascular;  Laterality: Right;  . BONE MARROW BIOPSY    . CHOLECYSTECTOMY    . CHOLECYSTECTOMY  1990's?  . EYE SURGERY Right   . INSERTION OF DIALYSIS CATHETER Right 10/16/2015   Procedure: INSERTION OF PALINDROME DIALYSIS CATHETER ;  Surgeon: Elam Dutch, MD;   Location: [facility];  Service: Vascular;  Laterality: Right;  . OTHER SURGICAL HISTORY     Retinal surgery  . PARS PLANA VITRECTOMY  02/17/2012   Procedure: PARS PLANA VITRECTOMY WITH 25 GAUGE;  Surgeon: Hayden Pedro, MD;  Location: [facility];  Service: Ophthalmology;  Laterality: Right;  . PERIPHERAL VASCULAR CATHETERIZATION N/A 03/20/2016   Procedure: A/V Shuntogram/Fistulagram;  Surgeon: Conrad [place], MD;  Location: [facility];  Service: Cardiovascular;  Laterality: N/A;    There were no vitals filed for this visit.      Subjective Assessment - 06/30/16 1112    Subjective Pt completed homework.   Patient is accompained by: Family member  wife   Currently in Pain? No/denies               ADULT SLP TREATMENT - 06/30/16 1112      General Information   Behavior/Cognition Alert;Cooperative;Pleasant mood     Treatment Provided   Treatment provided Cognitive-Linquistic     Cognitive-Linquistic Treatment   Treatment focused on Cognition   Skilled Treatment SLP targeted pt's attention today with change and making change under $1. Initially pt req'd max-total A , but SLP was able to fade cues to occasional mod verbal and visual cues, and extra time. Pt with variable success indicating difficulty  with sustained/selective attention (internal distraction).      Assessment / Recommendations / Plan   Plan Continue with current plan of care     Progression Toward Goals   Progression toward goals Progressing toward goals          SLP Education - 06/30/16 1157    Education provided Yes   Education Details Educated pt/wife on real change at home working better than pictures   Person(s) Educated Spouse;Patient   Methods Explanation   Comprehension Verbalized understanding          SLP Short Term Goals - 06/23/16 1129      SLP SHORT TERM GOAL #3   Title pt will demo speech 95% accurate in sentence-length responses with modified independence (compensations) over 3  sessions   Baseline 10/16   Status Partially Met     SLP SHORT TERM GOAL #5   Time 3   Status Not Met          SLP Long Term Goals - 06/30/16 1158      SLP LONG TERM GOAL #1   Title pt will demo functional speech in 20 minutes mod complex/complex conversation with modified independnence   Status Achieved     SLP LONG TERM GOAL #2   Title pt will demo HEP for speech-motor planning indpendently over 3 total sessions    Status Deferred  due to pt's desired work with cognition     SLP LONG TERM GOAL #3   Title pt will demo HEP for dysarthria independently over 5 total sessions   Status Deferred  due to pt desired focus on cognition     SLP LONG TERM GOAL #4   Title pt will demo functional organizational/sequencing ability for simple cognitive communication tasks   Time 4   Period Weeks  (or 23 total visits, for all LTGs)   Status On-going     SLP LONG TERM GOAL #5   Title pt will demo evidence of memory system initiated   Time 4   Period --  visits   Status On-going          Plan - 06/30/16 1158    Clinical Impression Statement Pt with improved speech intelligibility and self-monitoring. Focus continues on cognitive limitations, per pt desire. See LTG update for details re: goals met/not met. Pt would cont to benefit from skilled ST to incr pt independence. At this time, pt is not able to return to work due to the extent of his cognitive-lingusitic deficits.   Speech Therapy Frequency 2x / week   Duration 4 weeks  or 8 more visits/23 total visits   Treatment/Interventions SLP instruction and feedback;Compensatory strategies;Internal/external aids;Oral motor exercises;Patient/family education;Functional tasks;Cueing hierarchy;Cognitive reorganization   Potential to Achieve Goals Fair   Potential Considerations Severity of impairments   Consulted and Agree with Plan of Care Patient      Patient will benefit from skilled therapeutic intervention in order to improve  the following deficits and impairments:   Cognitive communication deficit  Dysarthria and anarthria  Apraxia    Problem List Patient Active Problem List   Diagnosis Date Noted  . Hemiparesis affecting right side as late effect of cerebrovascular accident ([place]) 02/28/2016  . Aphasia, late effect of cerebrovascular disease 02/28/2016  . Acute encephalopathy 01/08/2016  . Seizures ([place]) 01/08/2016  . History of ischemic left MCA stroke 01/08/2016  . Right sided weakness 01/08/2016  . Chronic combined systolic and diastolic CHF (congestive heart failure) ([place]) 01/08/2016  .  Atrial fibrillation ([place]) 01/08/2016  . Leukocytosis 01/08/2016  . Benign essential HTN 01/08/2016  . Anemia of chronic disease 01/08/2016  . Controlled type 2 diabetes mellitus with diabetic nephropathy, without long-term current use of insulin ([place]) 01/08/2016  . History of DVT (deep vein thrombosis) 01/08/2016  . ESRD (end stage renal disease) on dialysis [facility])     [facility] ,[place], [place]  06/30/2016, 12:01 PM  [facility] [address] [place], [place], [address] Phone: [phone]   Fax:  [phone]   Name: Jaray Boliver [facility] MRN: [id number] Date of Birth: 04-Feb-1966

## 2016-07-02 DIAGNOSIS — Z7901 Long term (current) use of anticoagulants: Secondary | ICD-10-CM | POA: Diagnosis not present

## 2016-07-02 DIAGNOSIS — T8249XA Other complication of vascular dialysis catheter, initial encounter: Secondary | ICD-10-CM | POA: Diagnosis not present

## 2016-07-02 DIAGNOSIS — N186 End stage renal disease: Secondary | ICD-10-CM | POA: Diagnosis not present

## 2016-07-02 DIAGNOSIS — E1129 Type 2 diabetes mellitus with other diabetic kidney complication: Secondary | ICD-10-CM | POA: Diagnosis not present

## 2016-07-02 DIAGNOSIS — D631 Anemia in chronic kidney disease: Secondary | ICD-10-CM | POA: Diagnosis not present

## 2016-07-02 DIAGNOSIS — D509 Iron deficiency anemia, unspecified: Secondary | ICD-10-CM | POA: Diagnosis not present

## 2016-07-02 MED FILL — CALCIUM ACETATE 667 MG CAP: 667 | 30 days supply | Qty: 180 | Fill #2

## 2016-07-04 ENCOUNTER — Ambulatory Visit: Payer: 59

## 2016-07-04 DIAGNOSIS — R471 Dysarthria and anarthria: Secondary | ICD-10-CM | POA: Diagnosis not present

## 2016-07-04 DIAGNOSIS — T8249XA Other complication of vascular dialysis catheter, initial encounter: Secondary | ICD-10-CM | POA: Diagnosis not present

## 2016-07-04 DIAGNOSIS — R41841 Cognitive communication deficit: Secondary | ICD-10-CM | POA: Diagnosis not present

## 2016-07-04 DIAGNOSIS — D631 Anemia in chronic kidney disease: Secondary | ICD-10-CM | POA: Diagnosis not present

## 2016-07-04 DIAGNOSIS — D509 Iron deficiency anemia, unspecified: Secondary | ICD-10-CM | POA: Diagnosis not present

## 2016-07-04 DIAGNOSIS — Z7901 Long term (current) use of anticoagulants: Secondary | ICD-10-CM | POA: Diagnosis not present

## 2016-07-04 DIAGNOSIS — E1129 Type 2 diabetes mellitus with other diabetic kidney complication: Secondary | ICD-10-CM | POA: Diagnosis not present

## 2016-07-04 DIAGNOSIS — R482 Apraxia: Secondary | ICD-10-CM | POA: Diagnosis not present

## 2016-07-04 DIAGNOSIS — N186 End stage renal disease: Secondary | ICD-10-CM | POA: Diagnosis not present

## 2016-07-04 NOTE — Patient Instructions (Signed)
  Please complete the assigned speech therapy homework and return it to your next session.

## 2016-07-04 NOTE — Therapy (Signed)
[facility] [address] [place], [place], [address] Phone: [phone]   Fax:  [phone]  Speech Language Pathology Treatment  Patient Details  Name: Isaiah [name] MRN: [id number] Date of Birth: 05/28/1966 Referring Provider: Alysia Penna, MD  Encounter Date: 07/04/2016      End of Session - 07/04/16 1135    Visit Number 17   Number of Visits 23   Date for SLP Re-Evaluation 09/05/16   SLP Start Time 80   SLP Stop Time  1100   SLP Time Calculation (min) 42 min   Activity Tolerance Patient tolerated treatment well      Past Medical History:  Diagnosis Date  . A-fib ([place])   . Acute renal failure ([place]) 07/2011  . Anemia   . Asthma   . Diabetes mellitus without complication ([place])   . ESRD (end stage renal disease) on dialysis ([place])   . Gout   . History of recent blood transfusion 10/27/14   2 Units PRBC's  . Hyperkalemia 07/2011  . Hypertension   . Monoclonal gammopathies   . Monoclonal gammopathy   . OSA on CPAP   . Pulmonary embolism ([place]) 07/2011   a. Tx with Coumadin for 6 months (unknown cause per patient).  . Pulmonary embolism ([place]) 07/2011; 09/27/2014   a. Bilat PE 07/2011 - unclear cause, tx with 6 months Coumadin.;   . Seizures ([place])   . Sickle cell-thalassemia disease ([place])    a. Sickle cell trait.  . Sleep apnea    a. uses CPAP.  [name] Stroke [facility])     Past Surgical History:  Procedure Laterality Date  . AV FISTULA PLACEMENT Right 12/05/2015   Procedure: INSERTION OF ARTERIOVENOUS (AV) GORE-TEX GRAFT ARM;  Surgeon: Elam Dutch, MD;  Location: [facility];  Service: Vascular;  Laterality: Right;  . BONE MARROW BIOPSY    . CHOLECYSTECTOMY    . CHOLECYSTECTOMY  1990's?  . EYE SURGERY Right   . INSERTION OF DIALYSIS CATHETER Right 10/16/2015   Procedure: INSERTION OF PALINDROME DIALYSIS CATHETER ;  Surgeon: Elam Dutch, MD;  Location: [facility];  Service: Vascular;  Laterality: Right;  .  OTHER SURGICAL HISTORY     Retinal surgery  . PARS PLANA VITRECTOMY  02/17/2012   Procedure: PARS PLANA VITRECTOMY WITH 25 GAUGE;  Surgeon: Hayden Pedro, MD;  Location: [facility];  Service: Ophthalmology;  Laterality: Right;  . PERIPHERAL VASCULAR CATHETERIZATION N/A 03/20/2016   Procedure: A/V Shuntogram/Fistulagram;  Surgeon: Conrad [place], MD;  Location: [facility];  Service: Cardiovascular;  Laterality: N/A;    There were no vitals filed for this visit.      Subjective Assessment - 07/04/16 1028    Subjective Pt did not do homework provided by SLP.   Currently in Pain? No/denies               ADULT SLP TREATMENT - 07/04/16 1028      General Information   Behavior/Cognition Alert;Cooperative;Pleasant mood     Treatment Provided   Treatment provided Cognitive-Linquistic     Cognitive-Linquistic Treatment   Treatment focused on Cognition   Skilled Treatment SLP worked with pt's attention skills today - sustained/selective attention with auditory ID of animals in a word string of 2 minutes. Pt ID'd 11/16 animials, without a clear plan how to count them >10; 72% success. Pt stated afterwards, "I should have written them down." SLP then had pt track with pencil/paper. He still had  difficulty keeping concentration for 2 minutes, 85% success. Radio was playing in background both tasks. Pt req'd max A problem solving how to improve accuracy, other than "turn off the radio." In quiet environment, pt ID'd 11/10 animals (one false positive).      Assessment / Recommendations / Plan   Plan Continue with current plan of care     Progression Toward Goals   Progression toward goals Progressing toward goals            SLP Short Term Goals - 06/23/16 1129      SLP SHORT TERM GOAL #3   Title pt will demo speech 95% accurate in sentence-length responses with modified independence (compensations) over 3 sessions   Baseline 10/16   Status Partially Met     SLP SHORT TERM  GOAL #5   Time 3   Status Not Met          SLP Long Term Goals - 07/04/16 1136      SLP LONG TERM GOAL #1   Title pt will demo functional speech in 20 minutes mod complex/complex conversation with modified independnence   Status Achieved     SLP LONG TERM GOAL #2   Title pt will demo HEP for speech-motor planning indpendently over 3 total sessions    Status Deferred  due to pt's desired work with cognition     SLP LONG TERM GOAL #3   Title pt will demo HEP for dysarthria independently over 5 total sessions   Status Deferred  due to pt desired focus on cognition     SLP LONG TERM GOAL #4   Title pt will demo functional organizational/sequencing ability for simple cognitive communication tasks   Time 4   Period Weeks  (or 23 total visits, for all LTGs)   Status On-going     SLP LONG TERM GOAL #5   Title pt will demo evidence of memory system initiated   Time 4   Period --  visits   Status On-going     SLP LONG TERM GOAL #6   Title pt will demo functional selective attention for 5 minute cognitive-linguistic task   Time 4   Period Weeks   Status New          Plan - 07/04/16 1135    Clinical Impression Statement Pt with improved speech intelligibility and self-monitoring. Focus continues on cognitive limitations, per pt desire, which are still significantly limited. Pt would cont to benefit from skilled ST to incr pt independence and reduce caregiver burden.   Speech Therapy Frequency 2x / week   Duration 4 weeks  or 8 more visits/23 total visits   Treatment/Interventions SLP instruction and feedback;Compensatory strategies;Internal/external aids;Oral motor exercises;Patient/family education;Functional tasks;Cueing hierarchy;Cognitive reorganization   Potential to Achieve Goals Fair   Potential Considerations Severity of impairments   Consulted and Agree with Plan of Care Patient      Patient will benefit from skilled therapeutic intervention in order to  improve the following deficits and impairments:   Cognitive communication deficit  Dysarthria and anarthria  Apraxia    Problem List Patient Active Problem List   Diagnosis Date Noted  . Hemiparesis affecting right side as late effect of cerebrovascular accident ([place]) 02/28/2016  . Aphasia, late effect of cerebrovascular disease 02/28/2016  . Acute encephalopathy 01/08/2016  . Seizures ([place]) 01/08/2016  . History of ischemic left MCA stroke 01/08/2016  . Right sided weakness 01/08/2016  . Chronic combined systolic and diastolic CHF (congestive heart  failure) ([place]) 01/08/2016  . Atrial fibrillation ([place]) 01/08/2016  . Leukocytosis 01/08/2016  . Benign essential HTN 01/08/2016  . Anemia of chronic disease 01/08/2016  . Controlled type 2 diabetes mellitus with diabetic nephropathy, without long-term current use of insulin ([place]) 01/08/2016  . History of DVT (deep vein thrombosis) 01/08/2016  . ESRD (end stage renal disease) on dialysis [facility])     [facility] ,[place], [place]  07/04/2016, 11:38 AM  [facility] [address] [place], [place], [address] Phone: [phone]   Fax:  [phone]   Name: Isaiah [name] [facility] MRN: [id number] Date of Birth: December 09, 1965

## 2016-07-07 ENCOUNTER — Ambulatory Visit: Payer: 59 | Admitting: *Deleted

## 2016-07-07 DIAGNOSIS — D509 Iron deficiency anemia, unspecified: Secondary | ICD-10-CM | POA: Diagnosis not present

## 2016-07-07 DIAGNOSIS — I871 Compression of vein: Secondary | ICD-10-CM | POA: Diagnosis not present

## 2016-07-07 DIAGNOSIS — T82858D Stenosis of vascular prosthetic devices, implants and grafts, subsequent encounter: Secondary | ICD-10-CM | POA: Diagnosis not present

## 2016-07-07 DIAGNOSIS — Z992 Dependence on renal dialysis: Secondary | ICD-10-CM | POA: Diagnosis not present

## 2016-07-07 DIAGNOSIS — N186 End stage renal disease: Secondary | ICD-10-CM | POA: Diagnosis not present

## 2016-07-07 DIAGNOSIS — E1129 Type 2 diabetes mellitus with other diabetic kidney complication: Secondary | ICD-10-CM | POA: Diagnosis not present

## 2016-07-07 DIAGNOSIS — T8249XA Other complication of vascular dialysis catheter, initial encounter: Secondary | ICD-10-CM | POA: Diagnosis not present

## 2016-07-07 DIAGNOSIS — D689 Coagulation defect, unspecified: Secondary | ICD-10-CM | POA: Diagnosis not present

## 2016-07-08 ENCOUNTER — Other Ambulatory Visit (HOSPITAL_BASED_OUTPATIENT_CLINIC_OR_DEPARTMENT_OTHER): Payer: 59

## 2016-07-08 ENCOUNTER — Ambulatory Visit (HOSPITAL_COMMUNITY)
Admission: RE | Admit: 2016-07-08 | Discharge: 2016-07-08 | Disposition: A | Discharge status: Home / Self Care | Payer: 59 | Source: Ambulatory Visit | Attending: Oncology | Admitting: Oncology

## 2016-07-08 DIAGNOSIS — D472 Monoclonal gammopathy: Secondary | ICD-10-CM

## 2016-07-08 DIAGNOSIS — Z992 Dependence on renal dialysis: Secondary | ICD-10-CM | POA: Diagnosis not present

## 2016-07-08 DIAGNOSIS — E1122 Type 2 diabetes mellitus with diabetic chronic kidney disease: Secondary | ICD-10-CM | POA: Diagnosis not present

## 2016-07-08 DIAGNOSIS — N186 End stage renal disease: Secondary | ICD-10-CM | POA: Diagnosis not present

## 2016-07-08 LAB — CBC WITH DIFFERENTIAL/PLATELET
BASO%: 0.9 % (ref 0.0–2.0)
Basophils Absolute: 0.1 10*3/uL (ref 0.0–0.1)
EOS%: 3.8 % (ref 0.0–7.0)
Eosinophils Absolute: 0.4 10*3/uL (ref 0.0–0.5)
HCT: 34.8 % — ABNORMAL LOW (ref 38.4–49.9)
HGB: 12.1 g/dL — ABNORMAL LOW (ref 13.0–17.1)
LYMPH%: 15.6 % (ref 14.0–49.0)
MCH: 29 pg (ref 27.2–33.4)
MCHC: 34.8 g/dL (ref 32.0–36.0)
MCV: 83.5 fL (ref 79.3–98.0)
MONO#: 1.3 10*3/uL — ABNORMAL HIGH (ref 0.1–0.9)
MONO%: 12.2 % (ref 0.0–14.0)
NEUT#: 7.2 10*3/uL — ABNORMAL HIGH (ref 1.5–6.5)
NEUT%: 67.5 % (ref 39.0–75.0)
Platelets: 287 10*3/uL (ref 140–400)
RBC: 4.17 10*6/uL — ABNORMAL LOW (ref 4.20–5.82)
RDW: 17.6 % — ABNORMAL HIGH (ref 11.0–14.6)
WBC: 10.7 10*3/uL — ABNORMAL HIGH (ref 4.0–10.3)
lymph#: 1.7 10*3/uL (ref 0.9–3.3)
nRBC: 4 % — ABNORMAL HIGH (ref 0–0)

## 2016-07-08 LAB — COMPREHENSIVE METABOLIC PANEL
ALT: 26 U/L (ref 0–55)
AST: 21 U/L (ref 5–34)
Albumin: 3.7 g/dL (ref 3.5–5.0)
Alkaline Phosphatase: 109 U/L (ref 40–150)
Anion Gap: 13 mEq/L — ABNORMAL HIGH (ref 3–11)
BUN: 38 mg/dL — ABNORMAL HIGH (ref 7.0–26.0)
CO2: 24 mEq/L (ref 22–29)
Calcium: 8.6 mg/dL (ref 8.4–10.4)
Chloride: 100 mEq/L (ref 98–109)
Creatinine: 10 mg/dL (ref 0.7–1.3)
EGFR: 6 mL/min/{1.73_m2} — ABNORMAL LOW (ref >90)
Glucose: 81 mg/dl (ref 70–140)
Potassium: 4.5 mEq/L (ref 3.5–5.1)
Sodium: 137 mEq/L (ref 136–145)
Total Bilirubin: 0.96 mg/dL (ref 0.20–1.20)
Total Protein: 9.9 g/dL — ABNORMAL HIGH (ref 6.4–8.3)

## 2016-07-09 DIAGNOSIS — D689 Coagulation defect, unspecified: Secondary | ICD-10-CM | POA: Diagnosis not present

## 2016-07-09 DIAGNOSIS — E1129 Type 2 diabetes mellitus with other diabetic kidney complication: Secondary | ICD-10-CM | POA: Diagnosis not present

## 2016-07-09 DIAGNOSIS — D509 Iron deficiency anemia, unspecified: Secondary | ICD-10-CM | POA: Diagnosis not present

## 2016-07-09 DIAGNOSIS — T8249XA Other complication of vascular dialysis catheter, initial encounter: Secondary | ICD-10-CM | POA: Diagnosis not present

## 2016-07-09 DIAGNOSIS — N186 End stage renal disease: Secondary | ICD-10-CM | POA: Diagnosis not present

## 2016-07-09 LAB — KAPPA/LAMBDA LIGHT CHAINS
Ig Kappa Free Light Chain: 778.6 mg/L — ABNORMAL HIGH (ref 3.3–19.4)
Ig Lambda Free Light Chain: 121.1 mg/L — ABNORMAL HIGH (ref 5.7–26.3)
Kappa/Lambda FluidC Ratio: 6.43 — ABNORMAL HIGH (ref 0.26–1.65)

## 2016-07-10 LAB — MULTIPLE MYELOMA PANEL, SERUM
Albumin SerPl Elph-Mcnc: 4.1 g/dL (ref 2.9–4.4)
Albumin/Glob SerPl: 0.8 (ref 0.7–1.7)
Alpha 1: 0.3 g/dL (ref 0.0–0.4)
Alpha2 Glob SerPl Elph-Mcnc: 0.7 g/dL (ref 0.4–1.0)
B-Globulin SerPl Elph-Mcnc: 0.9 g/dL (ref 0.7–1.3)
Gamma Glob SerPl Elph-Mcnc: 3.3 g/dL — ABNORMAL HIGH (ref 0.4–1.8)
Globulin, Total: 5.2 g/dL — ABNORMAL HIGH (ref 2.2–3.9)
IgA, Qn, Serum: 105 mg/dL (ref 90–386)
IgG, Qn, Serum: 3422 mg/dL — ABNORMAL HIGH (ref 700–1600)
IgM, Qn, Serum: 32 mg/dL (ref 20–172)
M Protein SerPl Elph-Mcnc: 2.5 g/dL — ABNORMAL HIGH
Total Protein: 9.3 g/dL — ABNORMAL HIGH (ref 6.0–8.5)

## 2016-07-11 ENCOUNTER — Ambulatory Visit: Payer: 59 | Attending: Physical Medicine & Rehabilitation

## 2016-07-11 DIAGNOSIS — I69351 Hemiplegia and hemiparesis following cerebral infarction affecting right dominant side: Secondary | ICD-10-CM | POA: Diagnosis not present

## 2016-07-11 DIAGNOSIS — I69315 Cognitive social or emotional deficit following cerebral infarction: Secondary | ICD-10-CM | POA: Insufficient documentation

## 2016-07-11 DIAGNOSIS — M6281 Muscle weakness (generalized): Secondary | ICD-10-CM | POA: Diagnosis not present

## 2016-07-11 DIAGNOSIS — T8249XA Other complication of vascular dialysis catheter, initial encounter: Secondary | ICD-10-CM | POA: Diagnosis not present

## 2016-07-11 DIAGNOSIS — R482 Apraxia: Secondary | ICD-10-CM | POA: Insufficient documentation

## 2016-07-11 DIAGNOSIS — R471 Dysarthria and anarthria: Secondary | ICD-10-CM | POA: Diagnosis not present

## 2016-07-11 DIAGNOSIS — N186 End stage renal disease: Secondary | ICD-10-CM | POA: Diagnosis not present

## 2016-07-11 DIAGNOSIS — R41841 Cognitive communication deficit: Secondary | ICD-10-CM | POA: Diagnosis not present

## 2016-07-11 DIAGNOSIS — D509 Iron deficiency anemia, unspecified: Secondary | ICD-10-CM | POA: Diagnosis not present

## 2016-07-11 DIAGNOSIS — D689 Coagulation defect, unspecified: Secondary | ICD-10-CM | POA: Diagnosis not present

## 2016-07-11 DIAGNOSIS — E1129 Type 2 diabetes mellitus with other diabetic kidney complication: Secondary | ICD-10-CM | POA: Diagnosis not present

## 2016-07-11 DIAGNOSIS — R278 Other lack of coordination: Secondary | ICD-10-CM | POA: Insufficient documentation

## 2016-07-11 DIAGNOSIS — R2681 Unsteadiness on feet: Secondary | ICD-10-CM | POA: Diagnosis not present

## 2016-07-11 NOTE — Patient Instructions (Signed)
  Please complete the assigned speech therapy homework and return it to your next session.

## 2016-07-11 NOTE — Therapy (Signed)
[facility] [address] [place], [place], [address] Phone: [phone]   Fax:  [phone]  Speech Language Pathology Treatment  Patient Details  Name: Isaiah [name] [facility] MRN: [id number] Date of Birth: [birth year] Referring Provider: Alysia Penna, MD  Encounter Date: 07/11/2016      End of Session - 07/11/16 1150    Visit Number 18   Number of Visits 23   SLP Start Time 1104   SLP Stop Time  1145   SLP Time Calculation (min) 41 min   Activity Tolerance Patient tolerated treatment well      Past Medical History:  Diagnosis Date  . A-fib ([place])   . Acute renal failure ([place]) 07/2011  . Anemia   . Asthma   . Diabetes mellitus without complication ([place])   . ESRD (end stage renal disease) on dialysis ([place])   . Gout   . History of recent blood transfusion 10/27/14   2 Units PRBC's  . Hyperkalemia 07/2011  . Hypertension   . Monoclonal gammopathies   . Monoclonal gammopathy   . OSA on CPAP   . Pulmonary embolism ([place]) 07/2011   a. Tx with Coumadin for 6 months (unknown cause per patient).  . Pulmonary embolism ([place]) 07/2011; 09/27/2014   a. Bilat PE 07/2011 - unclear cause, tx with 6 months Coumadin.;   . Seizures ([place])   . Sickle cell-thalassemia disease ([place])    a. Sickle cell trait.  . Sleep apnea    a. uses CPAP.  [name])     Past Surgical History:  Procedure Laterality Date  . AV FISTULA PLACEMENT Right 12/05/2015   Procedure: INSERTION OF ARTERIOVENOUS (AV) GORE-TEX GRAFT ARM;  Surgeon: Elam Dutch, MD;  Location: [facility];  Service: Vascular;  Laterality: Right;  . BONE MARROW BIOPSY    . CHOLECYSTECTOMY    . CHOLECYSTECTOMY  1990's?  . EYE SURGERY Right   . INSERTION OF DIALYSIS CATHETER Right 10/16/2015   Procedure: INSERTION OF PALINDROME DIALYSIS CATHETER ;  Surgeon: Elam Dutch, MD;  Location: [facility];  Service: Vascular;  Laterality: Right;  . OTHER SURGICAL HISTORY     Retinal  surgery  . PARS PLANA VITRECTOMY  02/17/2012   Procedure: PARS PLANA VITRECTOMY WITH 25 GAUGE;  Surgeon: Hayden Pedro, MD;  Location: [facility];  Service: Ophthalmology;  Laterality: Right;  . PERIPHERAL VASCULAR CATHETERIZATION N/A 03/20/2016   Procedure: A/V Shuntogram/Fistulagram;  Surgeon: Conrad Biltmore Forest, MD;  Location: [facility];  Service: Cardiovascular;  Laterality: N/A;    There were no vitals filed for this visit.             ADULT SLP TREATMENT - 07/11/16 1109      General Information   Behavior/Cognition Alert;Cooperative;Pleasant mood     Treatment Provided   Treatment provided Cognitive-Linquistic     Pain Assessment   Pain Assessment No/denies pain     Cognitive-Linquistic Treatment   Treatment focused on Cognition   Skilled Treatment Pt stated he would like to focus tx on cognitive-linguistics instead of dysarthria from this point forward. Alternating attention/attention task for targeting cognitive linguistics. Decr'd emergent awareness demonstrated as usually (50-80% of the time) SLP needed to show pt his errors. Attention to details on map req'd extra time consistently as well as SLP occasional mod VC. Alternating attention between map of hospital and questions or map and notes for map req'd consistent extra time and SLP min VC. Internal distraction  seen more evidently after 5 minutes of task.      Assessment / Recommendations / Plan   Plan Continue with current plan of care          SLP Education - 07/11/16 1149    Education provided Yes   Education Details cont in [place] until functional gains no longer seen   Person(s) Educated Patient   Methods Explanation   Comprehension Verbalized understanding          SLP Short Term Goals - 07/11/16 1150      SLP SHORT TERM GOAL #1   Title pt will demo HEP for apraxia/motor plannning with independence over 3 sessions   Baseline began new POC 02-21-16   Time 1   Period --   visits, for all STGs    Status Not Met     SLP SHORT TERM GOAL #2   Title pt will demo HEP for dysarthria with independence over 3 sessions   Baseline 03/27/16, 04/03/16, 04-07-16   Time 1   Period --  visits   Status Achieved     SLP SHORT TERM GOAL #3   Title pt will demo speech 95% accurate in sentence-length responses with modified independence (compensations) over 3 sessions   Status Partially Met  one of three sessions     SLP SHORT TERM GOAL #4   Title pt will provide 4 memory strategies to SLP   Status Deferred  due to work with intelligibility and simple problem solviing     SLP SHORT TERM GOAL #5   Title pt will demo simple math, time, money tasks with 80% success   Status Not Met          SLP Long Term Goals - 07/11/16 1151      SLP LONG TERM GOAL #1   Title pt will demo functional speech in 20 minutes mod complex/complex conversation with modified independnence   Status Achieved     SLP LONG TERM GOAL #2   Title pt will demo HEP for speech-motor planning indpendently over 3 total sessions    Status Deferred  due to pt's desired work with cognition     SLP [place] #3   Title pt will demo HEP for dysarthria independently over 5 total sessions   Status Deferred  due to pt desired focus on cognition     SLP LONG TERM GOAL #4   Title pt will demo functional organizational/sequencing ability for simple cognitive communication tasks   Time 4   Period Weeks  (or 23 total visits, for all LTGs)   Status On-going     SLP LONG TERM GOAL #5   Title pt will demo evidence of memory system initiated   Time 4   Period --  visits   Status On-going     SLP LONG TERM GOAL #6   Title pt will demo functional selective attention for 5 minute cognitive-linguistic task   Time 4   Period Weeks   Status New          Plan - 07/11/16 1150    Clinical Impression Statement Pt with improved speech intelligibility and self-monitoring with speech. Focus continues on cognitive limitations,  per pt desire, which are still significantly limited. Pt would cont to benefit from skilled ST to incr pt independence and reduce caregiver burden.    Speech Therapy Frequency 2x / week   Duration 4 weeks  or 8 more visits/23 total visits   Treatment/Interventions SLP instruction  and feedback;Compensatory strategies;Internal/external aids;Oral motor exercises;Patient/family education;Functional tasks;Cueing hierarchy;Cognitive reorganization   Potential to Achieve Goals Fair   Potential Considerations Severity of impairments   Consulted and Agree with Plan of Care Patient      Patient will benefit from skilled therapeutic intervention in order to improve the following deficits and impairments:   Cognitive communication deficit  Dysarthria and anarthria    Problem List Patient Active Problem List   Diagnosis Date Noted  . Hemiparesis affecting right side as late effect of cerebrovascular accident ([place]) 02/28/2016  . Aphasia, late effect of cerebrovascular disease 02/28/2016  . Acute encephalopathy 01/08/2016  . Seizures ([place]) 01/08/2016  . History of ischemic left MCA stroke 01/08/2016  . Right sided weakness 01/08/2016  . Chronic combined systolic and diastolic CHF (congestive heart failure) ([place]) 01/08/2016  . Atrial fibrillation ([place]) 01/08/2016  . Leukocytosis 01/08/2016  . Benign essential HTN 01/08/2016  . Anemia of chronic disease 01/08/2016  . Controlled type 2 diabetes mellitus with diabetic nephropathy, without long-term current use of insulin ([place]) 01/08/2016  . History of DVT (deep vein thrombosis) 01/08/2016  . ESRD (end stage renal disease) on dialysis [name])     [place] ,Louise, [place]  07/11/2016, 11:52 AM  [facility] [address] [place], [place], [address] Phone: [phone]   Fax:  [phone]   Name: Rube Sanchez [facility] MRN: [id number] Date of Birth: December 31, 1965

## 2016-07-14 ENCOUNTER — Emergency Department (HOSPITAL_COMMUNITY)
Admission: EM | Admit: 2016-07-14 | Discharge: 2016-07-14 | Disposition: A | Discharge status: Home / Self Care | Payer: 59 | Attending: Emergency Medicine | Admitting: Emergency Medicine

## 2016-07-14 ENCOUNTER — Encounter (HOSPITAL_COMMUNITY): Payer: Self-pay

## 2016-07-14 DIAGNOSIS — G40909 Epilepsy, unspecified, not intractable, without status epilepticus: Secondary | ICD-10-CM | POA: Diagnosis not present

## 2016-07-14 DIAGNOSIS — I5042 Chronic combined systolic (congestive) and diastolic (congestive) heart failure: Secondary | ICD-10-CM | POA: Insufficient documentation

## 2016-07-14 DIAGNOSIS — I132 Hypertensive heart and chronic kidney disease with heart failure and with stage 5 chronic kidney disease, or end stage renal disease: Secondary | ICD-10-CM | POA: Insufficient documentation

## 2016-07-14 DIAGNOSIS — Z7982 Long term (current) use of aspirin: Secondary | ICD-10-CM | POA: Insufficient documentation

## 2016-07-14 DIAGNOSIS — R29810 Facial weakness: Secondary | ICD-10-CM | POA: Diagnosis not present

## 2016-07-14 DIAGNOSIS — R569 Unspecified convulsions: Secondary | ICD-10-CM

## 2016-07-14 DIAGNOSIS — N186 End stage renal disease: Secondary | ICD-10-CM | POA: Insufficient documentation

## 2016-07-14 DIAGNOSIS — E1140 Type 2 diabetes mellitus with diabetic neuropathy, unspecified: Secondary | ICD-10-CM | POA: Insufficient documentation

## 2016-07-14 DIAGNOSIS — E1122 Type 2 diabetes mellitus with diabetic chronic kidney disease: Secondary | ICD-10-CM | POA: Insufficient documentation

## 2016-07-14 DIAGNOSIS — J45909 Unspecified asthma, uncomplicated: Secondary | ICD-10-CM | POA: Diagnosis not present

## 2016-07-14 DIAGNOSIS — Z992 Dependence on renal dialysis: Secondary | ICD-10-CM | POA: Insufficient documentation

## 2016-07-14 DIAGNOSIS — Z794 Long term (current) use of insulin: Secondary | ICD-10-CM | POA: Insufficient documentation

## 2016-07-14 DIAGNOSIS — Z7901 Long term (current) use of anticoagulants: Secondary | ICD-10-CM | POA: Diagnosis not present

## 2016-07-14 DIAGNOSIS — D689 Coagulation defect, unspecified: Secondary | ICD-10-CM | POA: Diagnosis not present

## 2016-07-14 DIAGNOSIS — Z8673 Personal history of transient ischemic attack (TIA), and cerebral infarction without residual deficits: Secondary | ICD-10-CM | POA: Insufficient documentation

## 2016-07-14 DIAGNOSIS — D509 Iron deficiency anemia, unspecified: Secondary | ICD-10-CM | POA: Diagnosis not present

## 2016-07-14 DIAGNOSIS — T8249XA Other complication of vascular dialysis catheter, initial encounter: Secondary | ICD-10-CM | POA: Diagnosis not present

## 2016-07-14 DIAGNOSIS — D631 Anemia in chronic kidney disease: Secondary | ICD-10-CM | POA: Diagnosis not present

## 2016-07-14 DIAGNOSIS — E1129 Type 2 diabetes mellitus with other diabetic kidney complication: Secondary | ICD-10-CM | POA: Diagnosis not present

## 2016-07-14 LAB — COMPREHENSIVE METABOLIC PANEL
ALT: 32 U/L (ref 17–63)
AST: 31 U/L (ref 15–41)
Albumin: 3.9 g/dL (ref 3.5–5.0)
Alkaline Phosphatase: 94 U/L (ref 38–126)
Anion gap: 16 — ABNORMAL HIGH (ref 5–15)
BUN: 34 mg/dL — ABNORMAL HIGH (ref 6–20)
CO2: 20 mmol/L — ABNORMAL LOW (ref 22–32)
Calcium: 7.8 mg/dL — ABNORMAL LOW (ref 8.9–10.3)
Chloride: 99 mmol/L — ABNORMAL LOW (ref 101–111)
Creatinine, Ser: 9.21 mg/dL — ABNORMAL HIGH (ref 0.61–1.24)
GFR calc Af Amer: 7 mL/min — ABNORMAL LOW (ref >60)
GFR calc non Af Amer: 6 mL/min — ABNORMAL LOW (ref >60)
Glucose, Bld: 137 mg/dL — ABNORMAL HIGH (ref 65–99)
Potassium: 4.1 mmol/L (ref 3.5–5.1)
Sodium: 135 mmol/L (ref 135–145)
Total Bilirubin: 1.1 mg/dL (ref 0.3–1.2)
Total Protein: 9.6 g/dL — ABNORMAL HIGH (ref 6.5–8.1)

## 2016-07-14 LAB — CBC WITH DIFFERENTIAL/PLATELET
Basophils Absolute: 0.1 K/uL (ref 0.0–0.1)
Basophils Relative: 1 %
Eosinophils Absolute: 0.2 K/uL (ref 0.0–0.7)
Eosinophils Relative: 1 %
HCT: 33.7 % — ABNORMAL LOW (ref 39.0–52.0)
Hemoglobin: 12 g/dL — ABNORMAL LOW (ref 13.0–17.0)
Lymphocytes Relative: 11 %
Lymphs Abs: 1.5 K/uL (ref 0.7–4.0)
MCH: 29.2 pg (ref 26.0–34.0)
MCHC: 35.6 g/dL (ref 30.0–36.0)
MCV: 82 fL (ref 78.0–100.0)
Monocytes Absolute: 1.2 K/uL — ABNORMAL HIGH (ref 0.1–1.0)
Monocytes Relative: 9 %
Neutro Abs: 10 K/uL — ABNORMAL HIGH (ref 1.7–7.7)
Neutrophils Relative %: 78 %
Platelets: 269 K/uL (ref 150–400)
RBC: 4.11 MIL/uL — ABNORMAL LOW (ref 4.22–5.81)
RDW: 17.2 % — ABNORMAL HIGH (ref 11.5–15.5)
WBC: 12.9 K/uL — ABNORMAL HIGH (ref 4.0–10.5)

## 2016-07-14 LAB — PROTIME-INR
INR: 1.08
Prothrombin Time: 14.1 s (ref 11.4–15.2)

## 2016-07-14 LAB — CBG MONITORING, ED: Glucose-Capillary: 157 mg/dL — ABNORMAL HIGH (ref 65–99)

## 2016-07-14 LAB — MAGNESIUM: Magnesium: 2 mg/dL (ref 1.7–2.4)

## 2016-07-14 MED ORDER — PHENYTOIN SODIUM EXTENDED 100 MG PO CAPS: 100.0000 mg | ORAL_CAPSULE | Freq: Three times a day (TID) | ORAL | 0 refills | Status: DC

## 2016-07-14 MED ORDER — PHENYTOIN SODIUM EXTENDED 100 MG PO CAPS
100.0000 mg | ORAL_CAPSULE | Freq: Once | ORAL | Status: AC
  Administered 2016-07-14: 100 mg via ORAL
  Filled 2016-07-14: qty 1

## 2016-07-14 MED ORDER — PHENYTOIN 50 MG PO CHEW
100.0000 mg | CHEWABLE_TABLET | Freq: Once | ORAL | Status: DC
  Filled 2016-07-14: qty 2

## 2016-07-14 MED FILL — SENSIPAR 60 MG TABLET: 60 | 30 days supply | Qty: 30 | Fill #3

## 2016-07-14 MED FILL — BREO ELLIPTA 200-25 MCG INH: 200-25 | 30 days supply | Qty: 60 | Fill #4

## 2016-07-14 MED FILL — MIDODRINE HCL 2.5 MG TABLET: 2.5 | 90 days supply | Qty: 180 | Fill #2

## 2016-07-14 NOTE — ED Triage Notes (Signed)
Pt BIB [facility] for evaluation of grand mal sz following full dialysis treatment today. Pt. Was post ictal on EMS arrival, Pt. AxO x4 on arrival to ED. Pt. Has had 3 sz since April, no hx prior. Wife states last sz was in September in [place], states also has has GI bleed and taken off coumadin since last visit.

## 2016-07-14 NOTE — ED Provider Notes (Signed)
[facility] DEPT Provider Note   CSN: [id number] Arrival date & time: 07/14/16  1758  History   Chief Complaint Chief Complaint  Patient presents with  . Seizures    HPI Isaiah [name] is a 50 y.o. male.   Seizures   This is a recurrent problem. The current episode started 1 to 2 hours ago. The problem has been resolved. There was 1 seizure. The most recent episode lasted 2 to 5 minutes. Pertinent negatives include no sore throat, no chest pain, no nausea, no vomiting and no diarrhea. Characteristics include rhythmic jerking. The episode was witnessed. The seizures did not continue in the ED. The seizure(s) had no focality. Possible causes do not include medication or dosage change, missed seizure meds or recent illness. There has been no fever. There were no medications administered prior to arrival.    Past Medical History:  Diagnosis Date  . A-fib ([place])   . Acute renal failure ([place]) 07/2011  . Anemia   . Asthma   . Diabetes mellitus without complication ([place])   . ESRD (end stage renal disease) on dialysis ([place])   . Gout   . History of recent blood transfusion 10/27/14   2 Units PRBC's  . Hyperkalemia 07/2011  . Hypertension   . Monoclonal gammopathies   . Monoclonal gammopathy   . OSA on CPAP   . Pulmonary embolism ([place]) 07/2011   a. Tx with Coumadin for 6 months (unknown cause per patient).  . Pulmonary embolism ([place]) 07/2011; 09/27/2014   a. Bilat PE 07/2011 - unclear cause, tx with 6 months Coumadin.;   . Seizures ([place])   . Sickle cell-thalassemia disease ([place])    a. Sickle cell trait.  . Sleep apnea    a. uses CPAP.  [name] Stroke [facility])     Patient Active Problem List   Diagnosis Date Noted  . Hemiparesis affecting right side as late effect of cerebrovascular accident ([place]) 02/28/2016  . Aphasia, late effect of cerebrovascular disease 02/28/2016  . Acute encephalopathy 01/08/2016  . Seizures ([place]) 01/08/2016  . History of ischemic left MCA stroke 01/08/2016    . Right sided weakness 01/08/2016  . Chronic combined systolic and diastolic CHF (congestive heart failure) ([place]) 01/08/2016  . Atrial fibrillation ([place]) 01/08/2016  . Leukocytosis 01/08/2016  . Benign essential HTN 01/08/2016  . Anemia of chronic disease 01/08/2016  . Controlled type 2 diabetes mellitus with diabetic nephropathy, without long-term current use of insulin ([place]) 01/08/2016  . History of DVT (deep vein thrombosis) 01/08/2016  . ESRD (end stage renal disease) on dialysis [name])     Past Surgical History:  Procedure Laterality Date  . AV FISTULA PLACEMENT Right 12/05/2015   Procedure: INSERTION OF ARTERIOVENOUS (AV) GORE-TEX GRAFT ARM;  Surgeon: Elam Dutch, MD;  Location: [facility];  Service: Vascular;  Laterality: Right;  . BONE MARROW BIOPSY    . CHOLECYSTECTOMY    . CHOLECYSTECTOMY  1990's?  . EYE SURGERY Right   . INSERTION OF DIALYSIS CATHETER Right 10/16/2015   Procedure: INSERTION OF PALINDROME DIALYSIS CATHETER ;  Surgeon: Elam Dutch, MD;  Location: [facility];  Service: Vascular;  Laterality: Right;  . OTHER SURGICAL HISTORY     Retinal surgery  . PARS PLANA VITRECTOMY  02/17/2012   Procedure: PARS PLANA VITRECTOMY WITH 25 GAUGE;  Surgeon: Hayden Pedro, MD;  Location: [facility];  Service: Ophthalmology;  Laterality: Right;  . PERIPHERAL VASCULAR CATHETERIZATION N/A 03/20/2016   Procedure: A/V Shuntogram/Fistulagram;  Surgeon: Aaron Edelman  Starlyn Skeans, MD;  Location: [facility] CV [facility];  Service: Cardiovascular;  Laterality: N/A;       Home Medications    Prior to Admission medications   Medication Sig Start Date End Date Taking? Authorizing Provider  albuterol (PROAIR HFA) 108 (90 Base) MCG/ACT inhaler Inhale two puffs every 4-6 hours if needed for cough or wheeze 04/08/16  Yes Jiles Prows, MD  cinacalcet (SENSIPAR) 60 MG tablet Take 60 mg by mouth every evening.  12/27/15  Yes Historical Provider, MD  fluticasone furoate-vilanterol (BREO ELLIPTA) 100-25 MCG/INH AEPB  Inhale one puff once daily to prevent cough or wheeze Patient taking differently: Inhale 1 puff into the lungs daily. Inhale one puff once daily to prevent cough or wheeze 04/08/16  Yes Jiles Prows, MD  Insulin Glargine (LANTUS) 100 UNIT/ML Solostar Pen Inject 10 Units into the skin daily at 10 pm. Patient taking differently: Inject 20 Units into the skin daily at 10 pm.  01/10/16  Yes [name], MD  Lacosamide (VIMPAT) 150 MG TABS Take 1 tablet (150 mg total) by mouth 2 (two) times daily. 06/19/16  Yes Dennie Bible, NP  metoprolol tartrate (LOPRESSOR) 25 MG tablet Take 0.5 tablets (12.5 mg total) by mouth 2 (two) times daily. 01/10/16  Yes [name], MD  midodrine (PROAMATINE) 2.5 MG tablet Take 1 tablet (2.5 mg total) by mouth 2 (two) times daily with a meal. 11/02/15  Yes Ivan Anchors Love, PA-C  multivitamin (RENA-VIT) TABS tablet Take 1 tablet by mouth at bedtime. 11/02/15  Yes Ivan Anchors Love, PA-C  pantoprazole (PROTONIX) 40 MG tablet Take 1 tablet (40 mg total) by mouth 2 (two) times daily. 11/02/15  Yes Ivan Anchors Love, PA-C  aspirin EC 81 MG tablet Take 1 tablet (81 mg total) by mouth daily. Patient not taking: Reported on 06/19/2016 11/02/15   Ivan Anchors Love, PA-C  warfarin (COUMADIN) 5 MG tablet Take 1.5 tablets (7.5 mg total) by mouth daily at 6 PM. Patient not taking: Reported on 06/19/2016 11/02/15   Bary Leriche, PA-C    Family History Family History  Problem Relation Age of Onset  . Hypertension Mother   . Diabetes Mother   . Stroke Father   . Sickle cell anemia Brother   . Diabetes type I Brother   . Kidney disease Brother     Social History Social History  Substance Use Topics  . Smoking status: Never Smoker  . Smokeless tobacco: Never Used  . Alcohol use No     Allergies   Codeine   Review of Systems Review of Systems  Constitutional: Negative for fatigue and fever.  HENT: Negative for congestion and sore throat.   Respiratory: Negative for shortness  of breath.   Cardiovascular: Negative for chest pain.  Gastrointestinal: Negative for abdominal pain, diarrhea, nausea and vomiting.  Genitourinary: Negative for dysuria.  Skin: Negative for rash.  Neurological: Positive for seizures.  All other systems reviewed and are negative.    Physical Exam Updated Vital Signs BP 129/84   Pulse 82   Temp 97.9 F (36.6 C) (Oral)   Resp 20   SpO2 100%   Physical Exam  Constitutional: He is oriented to person, place, and time. He appears well-developed and well-nourished. No distress.  HENT:  Head: Normocephalic and atraumatic.  Mouth/Throat: Oropharynx is clear and moist.  Eyes: EOM are normal. Pupils are equal, round, and reactive to light.  Cardiovascular: Normal rate.   Right hemodialysis port in place  Pulmonary/Chest: Effort normal. No respiratory distress. He has no wheezes.  Abdominal: Soft. He exhibits no distension. There is no tenderness. There is no guarding.  Musculoskeletal: Normal range of motion. He exhibits no edema.  Neurological: He is alert and oriented to person, place, and time. A cranial nerve deficit is present. He exhibits abnormal muscle tone. Coordination abnormal.  Right sided facial droop noted  Right intrinsic hand and bicep flexion decreased.   5/5 bilateral plantar flexion, dorsiflexion and hip flexion  5/5 left intrinsic hand, bicep flexion, tricep extension.   Tremor noted with movement  Skin: Skin is warm. Capillary refill takes less than 2 seconds. He is not diaphoretic.  Psychiatric: His behavior is normal. Thought content normal.  Nursing note and vitals reviewed.    ED Treatments / Results  Labs (all labs ordered are listed, but only abnormal results are displayed) Labs Reviewed  CBC WITH DIFFERENTIAL/PLATELET - Abnormal; Notable for the following:       Result Value   WBC 12.9 (*)    RBC 4.11 (*)    Hemoglobin 12.0 (*)    HCT 33.7 (*)    RDW 17.2 (*)    Neutro Abs 10.0 (*)     Monocytes Absolute 1.2 (*)    All other components within normal limits  COMPREHENSIVE METABOLIC PANEL - Abnormal; Notable for the following:    Chloride 99 (*)    CO2 20 (*)    Glucose, Bld 137 (*)    BUN 34 (*)    Creatinine, Ser 9.21 (*)    Calcium 7.8 (*)    Total Protein 9.6 (*)    GFR calc non Af Amer 6 (*)    GFR calc Af Amer 7 (*)    Anion gap 16 (*)    All other components within normal limits  CBG MONITORING, ED - Abnormal; Notable for the following:    Glucose-Capillary 157 (*)    All other components within normal limits  PROTIME-INR  MAGNESIUM   Procedures Procedures (including critical care time)  Medications Ordered in ED Medications - No data to display   Initial Impression / Assessment and Plan / ED Course  I have reviewed the triage vital signs and the nursing notes.  Pertinent labs & imaging results that were available during my care of the patient were reviewed by me and considered in my medical decision making (see chart for details).  Clinical Course    Mr. Sagan is a 50 year old male with multiple comorbidities including MCA stroke with right sided deficits, seizures since April 2017 currently on Vimpat and end-stage renal disease on dialysis who presents to emergency department today with seizure after dialysis. Patient was witnessed to have a tonic-clonic seizure that lasted a few minutes that resolved. Patient is back to baseline per family.   They deny any infectious etiology including cough, shortness of breath, dysuria, fever, congestion or sore throat.  Deny any medication changes.  Patient had a similar episode after dialysis in [place]. At that point he was noticed to have a low hemoglobin secondary to GI bleed reportedly in his small intestine. Patient reportedly had IR stop the bleed.  Patient well-appearing. More than likely could be due to electrolyte imbalance/shift with dialysis as this is sick the second time this happened during  dialysis.  Patient denies any GI bleeds, melena but will obtain labs for full evaluation.  Labs at baseline.   Currently Dr. Carlis Abbott, his PCP is managing his Vimpat.   I spoke  with Dr. Leonel Ramsay who recommended we start Dilantin 100 mg TID since his Vimpat is currently at 150 mg BID.   Patient will be prescribed this and follow up with PCP. Patient and family in agreement with plan.   Discharged home in good health after first dose of dilantin given here.   Final Clinical Impressions(s) / ED Diagnoses   Final diagnoses:  Seizure ([place])  Seizure disorder (H[facility])   New Prescriptions New Prescriptions   PHENYTOIN (DILANTIN) 100 MG ER CAPSULE    Take 1 capsule (100 mg total) by mouth 3 (three) times daily.     Roberto Scales, MD 07/14/16 [place] Yao, MD 07/15/16 [phone]

## 2016-07-15 ENCOUNTER — Ambulatory Visit (HOSPITAL_BASED_OUTPATIENT_CLINIC_OR_DEPARTMENT_OTHER): Payer: 59 | Admitting: Oncology

## 2016-07-15 ENCOUNTER — Ambulatory Visit: Payer: 59

## 2016-07-15 ENCOUNTER — Telehealth: Payer: Self-pay | Admitting: Nurse Practitioner

## 2016-07-15 VITALS — BP 113/84 | HR 65 | Temp 98.9°F | Resp 18 | Ht 71.0 in | Wt 213.9 lb

## 2016-07-15 DIAGNOSIS — D472 Monoclonal gammopathy: Secondary | ICD-10-CM | POA: Diagnosis not present

## 2016-07-15 DIAGNOSIS — Z86718 Personal history of other venous thrombosis and embolism: Secondary | ICD-10-CM

## 2016-07-15 DIAGNOSIS — R41841 Cognitive communication deficit: Secondary | ICD-10-CM

## 2016-07-15 DIAGNOSIS — N189 Chronic kidney disease, unspecified: Secondary | ICD-10-CM | POA: Diagnosis not present

## 2016-07-15 DIAGNOSIS — R471 Dysarthria and anarthria: Secondary | ICD-10-CM | POA: Diagnosis not present

## 2016-07-15 DIAGNOSIS — M6281 Muscle weakness (generalized): Secondary | ICD-10-CM | POA: Diagnosis not present

## 2016-07-15 DIAGNOSIS — R482 Apraxia: Secondary | ICD-10-CM | POA: Diagnosis not present

## 2016-07-15 DIAGNOSIS — I69351 Hemiplegia and hemiparesis following cerebral infarction affecting right dominant side: Secondary | ICD-10-CM | POA: Diagnosis not present

## 2016-07-15 DIAGNOSIS — R2681 Unsteadiness on feet: Secondary | ICD-10-CM | POA: Diagnosis not present

## 2016-07-15 DIAGNOSIS — R278 Other lack of coordination: Secondary | ICD-10-CM | POA: Diagnosis not present

## 2016-07-15 DIAGNOSIS — D631 Anemia in chronic kidney disease: Secondary | ICD-10-CM | POA: Diagnosis not present

## 2016-07-15 DIAGNOSIS — Z992 Dependence on renal dialysis: Secondary | ICD-10-CM

## 2016-07-15 DIAGNOSIS — Z8673 Personal history of transient ischemic attack (TIA), and cerebral infarction without residual deficits: Secondary | ICD-10-CM

## 2016-07-15 DIAGNOSIS — I69315 Cognitive social or emotional deficit following cerebral infarction: Secondary | ICD-10-CM | POA: Diagnosis not present

## 2016-07-15 MED FILL — PHENYTOIN SOD EXT 100 MG CA: 100 | 30 days supply | Qty: 90 | Fill #0

## 2016-07-15 NOTE — Telephone Encounter (Signed)
Spoke to wife.  Pt will come in 07-24-16 at 0900 for 0915 appt.  Will bring am dose of dilantin with him and depending if gets lab work done,(trough) will take med then.  Wife did understand,  Will have some labs done in dialysis and will being with them.

## 2016-07-15 NOTE — Telephone Encounter (Signed)
Pt's wife called to advise he had a seizure when he finished dialysis last night, EMS called and he was taken to the ED. She said he was given dilantin.  She is wanting to touch base with the clinic as this is a new medication for him. Please call

## 2016-07-15 NOTE — Therapy (Signed)
[facility] [address] [place], [place], [address] Phone: [phone]   Fax:  [phone]  Speech Language Pathology Treatment  Patient Details  Name: Isaiah [name] [facility] MRN: [id number] Date of Birth: Mar 13, 1966 Referring Provider: Alysia Penna, MD  Encounter Date: 07/15/2016      End of Session - 07/15/16 1640    Visit Number 19   Number of Visits 23   Date for SLP Re-Evaluation 09/05/16   SLP Start Time 66   SLP Stop Time  1615   SLP Time Calculation (min) 41 min   Activity Tolerance Patient tolerated treatment well      Past Medical History:  Diagnosis Date  . A-fib ([place])   . Acute renal failure ([place]) 07/2011  . Anemia   . Asthma   . Diabetes mellitus without complication ([place])   . ESRD (end stage renal disease) on dialysis ([place])   . Gout   . History of recent blood transfusion 10/27/14   2 Units PRBC's  . Hyperkalemia 07/2011  . Hypertension   . Monoclonal gammopathies   . Monoclonal gammopathy   . OSA on CPAP   . Pulmonary embolism ([place]) 07/2011   a. Tx with Coumadin for 6 months (unknown cause per patient).  . Pulmonary embolism ([place]) 07/2011; 09/27/2014   a. Bilat PE 07/2011 - unclear cause, tx with 6 months Coumadin.;   . Seizures ([place])   . Sickle cell-thalassemia disease ([place])    a. Sickle cell trait.  . Sleep apnea    a. uses CPAP.  [name] Stroke [facility])     Past Surgical History:  Procedure Laterality Date  . AV FISTULA PLACEMENT Right 12/05/2015   Procedure: INSERTION OF ARTERIOVENOUS (AV) GORE-TEX GRAFT ARM;  Surgeon: Elam Dutch, MD;  Location: [facility];  Service: Vascular;  Laterality: Right;  . BONE MARROW BIOPSY    . CHOLECYSTECTOMY    . CHOLECYSTECTOMY  1990's?  . EYE SURGERY Right   . INSERTION OF DIALYSIS CATHETER Right 10/16/2015   Procedure: INSERTION OF PALINDROME DIALYSIS CATHETER ;  Surgeon: Elam Dutch, MD;  Location: [facility];  Service: Vascular;  Laterality: Right;  .  OTHER SURGICAL HISTORY     Retinal surgery  . PARS PLANA VITRECTOMY  02/17/2012   Procedure: PARS PLANA VITRECTOMY WITH 25 GAUGE;  Surgeon: Hayden Pedro, MD;  Location: [facility];  Service: Ophthalmology;  Laterality: Right;  . PERIPHERAL VASCULAR CATHETERIZATION N/A 03/20/2016   Procedure: A/V Shuntogram/Fistulagram;  Surgeon: Conrad St[place], MD;  Location: [facility];  Service: Cardiovascular;  Laterality: N/A;    There were no vitals filed for this visit.      Subjective Assessment - 07/15/16 1626    Subjective Pt with seizure yesterday as he was leaving dialysis, observation yesterday at hospital ED.   Patient is accompained by: Family member  mother   Currently in Pain? No/denies               ADULT SLP TREATMENT - 07/15/16 1627      General Information   Behavior/Cognition Alert;Cooperative;Pleasant mood     Treatment Provided   Treatment provided Cognitive-Linquistic     Cognitive-Linquistic Treatment   Treatment focused on Cognition   Skilled Treatment Pt's cognitive linguistic skills as well as dysarthria appear commensurate with last week, family also reports pt does not seem any different than last week. Pt was provided a task to improve attention to detail and selective attention. During this  task, pt noted to attend for approx 3 minutes and then required verbal cues from SLP to continue or to re-read the written direction and continue. SLP encouraged pt to correct his answers after each direction x3, then pt did so spontaneously. Error awareness was decr'd however pt made attempt at double checking answers/responses.      Assessment / Recommendations / Plan   Plan Continue with current plan of care     Progression Toward Goals   Progression toward goals Progressing toward goals            SLP Short Term Goals - 07/11/16 1150      SLP SHORT TERM GOAL #1   Title pt will demo HEP for apraxia/motor plannning with independence over 3 sessions    Baseline began new POC 02-21-16   Time 1   Period --   visits, for all STGs   Status Not Met     SLP SHORT TERM GOAL #2   Title pt will demo HEP for dysarthria with independence over 3 sessions   Baseline 03/27/16, 04/03/16, 04-07-16   Time 1   Period --  visits   Status Achieved     SLP SHORT TERM GOAL #3   Title pt will demo speech 95% accurate in sentence-length responses with modified independence (compensations) over 3 sessions   Status Partially Met  one of three sessions     SLP SHORT TERM GOAL #4   Title pt will provide 4 memory strategies to SLP   Status Deferred  due to work with intelligibility and simple problem solviing     SLP SHORT TERM GOAL #5   Title pt will demo simple math, time, money tasks with 80% success   Status Not Met          SLP Long Term Goals - 07/15/16 1642      SLP LONG TERM GOAL #1   Title pt will demo functional speech in 20 minutes mod complex/complex conversation with modified independnence   Status Achieved     SLP LONG TERM GOAL #2   Title pt will demo HEP for speech-motor planning indpendently over 3 total sessions    Status Deferred  due to pt's desired work with cognition     SLP LONG TERM GOAL #3   Title pt will demo HEP for dysarthria independently over 5 total sessions   Status Deferred  due to pt desired focus on cognition     SLP LONG TERM GOAL #4   Title pt will demo functional organizational/sequencing ability for simple cognitive communication tasks   Time 3   Period Weeks  (or 23 total visits, for all LTGs)   Status On-going     SLP LONG TERM GOAL #5   Title pt will demo evidence of memory system initiated   Time 3   Period --  visits   Status On-going     SLP LONG TERM GOAL #6   Title pt will demo functional selective attention for 5 minute cognitive-linguistic task   Time 3   Period Weeks   Status On-going          Plan - 07/15/16 1641    Clinical Impression Statement Focus in [place] continues on  cognitive limitations, per pt desire, which are still significantly limited. Pt would cont to benefit from skilled ST to incr pt independence and reduce caregiver burden.    Speech Therapy Frequency 2x / week   Duration 4 weeks  or 8  more visits/23 total visits   Treatment/Interventions SLP instruction and feedback;Compensatory strategies;Internal/external aids;Oral motor exercises;Patient/family education;Functional tasks;Cueing hierarchy;Cognitive reorganization   Potential to Achieve Goals Fair   Potential Considerations Severity of impairments   Consulted and Agree with Plan of Care Patient      Patient will benefit from skilled therapeutic intervention in order to improve the following deficits and impairments:   Cognitive communication deficit  Dysarthria and anarthria    Problem List Patient Active Problem List   Diagnosis Date Noted  . Hemiparesis affecting right side as late effect of cerebrovascular accident ([place]) 02/28/2016  . Aphasia, late effect of cerebrovascular disease 02/28/2016  . Acute encephalopathy 01/08/2016  . Seizures ([place]) 01/08/2016  . History of ischemic left MCA stroke 01/08/2016  . Right sided weakness 01/08/2016  . Chronic combined systolic and diastolic CHF (congestive heart failure) ([place]) 01/08/2016  . Atrial fibrillation ([place]) 01/08/2016  . Leukocytosis 01/08/2016  . Benign essential HTN 01/08/2016  . Anemia of chronic disease 01/08/2016  . Controlled type 2 diabetes mellitus with diabetic nephropathy, without long-term current use of insulin ([place]) 01/08/2016  . History of DVT (deep vein thrombosis) 01/08/2016  . ESRD (end stage renal disease) on dialysis [name])     [place] ,[place], [place]  07/15/2016, 4:43 PM  [facility] [address] [place], [place], [address] Phone: [phone]   Fax:  [phone]   Name: Isaiah [name] [facility] MRN: [id number] Date of Birth: October 16, 1965

## 2016-07-15 NOTE — Telephone Encounter (Signed)
This is the second time he has had seizures after dialysis, probably due to electrolyte shifts. We will need to do level in 10 days he can have appt then.

## 2016-07-15 NOTE — Progress Notes (Signed)
Hematology and Oncology Follow Up Visit  [place] [id number] 11-01-1965 50 y.o. 07/15/2016 9:14 AM    Principle Diagnosis: This is a 50 year old gentleman with the following diagnoses:   1. Monoclonal protein in the form of IgG kappa, likely represents monoclonal gammopathy, undetermined significance versus a smoldering myeloma. He presented with M spike about 1 g/dL and bone marrow biopsy showed 7% plasma cell involvement without evidence of myeloma in 12/2010. He did not have any lytic bone lesions. Repeat a bone marrow biopsy in 10/16/2014 showed 13% plasma cell infiltration and no lytic bone lesions on his skeletal survey.  2. Multifactorial anemia: The etiology includes anemia of renal disease, plasma cell disorder, myeloproliferative disorder, and GI bleeding.   3. Chronic renal failure hemodialysis-dependent are not related that the plasma cell disorder.  4. He is status post CVA and right-sided weakness and speech difficulties in February 2017.  Interim History:  Isaiah [name] presents today for a followup visit with his family. Since the last visit, he reports few issues. He continues to develop occasional seizures and has been on Dilantin recently. He was also taken off warfarin after developing GI bleed while visiting his mother in [place]. He is fully recovered at this time from these incidents and feeling reasonably well. He is ambulating without the help of a walker or cane and had not had any falls or syncope.  His appetite remain excellent and does not report any back pain, shoulder pain or pathological fractures. He remains hemodialysis-dependent with a fistula and place although have not been used and elevating maturation.  He does not report any headaches, blurry vision, syncope. He does not report any fevers, chills, sweats or weight loss. He does not report any  palpitation, orthopnea or leg edema. Does not report any cough or hemoptysis or hematemesis. He does not report  any nausea, vomiting. He does not report any frequency, urgency or hesitancy. Rest of his review of systems unremarkable.   Medications: I have reviewed the patient's current medications.  Current Outpatient Prescriptions:  .  albuterol (PROAIR HFA) 108 (90 Base) MCG/ACT inhaler, Inhale two puffs every 4-6 hours if needed for cough or wheeze, Disp: 1 Inhaler, Rfl: 1 .  aspirin EC 81 MG tablet, Take 1 tablet (81 mg total) by mouth daily. (Patient not taking: Reported on 06/19/2016), Disp: 100 tablet, Rfl: 0 .  cinacalcet (SENSIPAR) 60 MG tablet, Take 60 mg by mouth every evening. , Disp: , Rfl:  .  fluticasone furoate-vilanterol (BREO ELLIPTA) 100-25 MCG/INH AEPB, Inhale one puff once daily to prevent cough or wheeze (Patient taking differently: Inhale 1 puff into the lungs daily. Inhale one puff once daily to prevent cough or wheeze), Disp: 60 each, Rfl: 5 .  Insulin Glargine (LANTUS) 100 UNIT/ML Solostar Pen, Inject 10 Units into the skin daily at 10 pm. (Patient taking differently: Inject 20 Units into the skin daily at 10 pm. ), Disp: 15 mL, Rfl: 1 .  Lacosamide (VIMPAT) 150 MG TABS, Take 1 tablet (150 mg total) by mouth 2 (two) times daily., Disp: 60 tablet, Rfl: 5 .  metoprolol tartrate (LOPRESSOR) 25 MG tablet, Take 0.5 tablets (12.5 mg total) by mouth 2 (two) times daily., Disp: 60 tablet, Rfl: 0 .  midodrine (PROAMATINE) 2.5 MG tablet, Take 1 tablet (2.5 mg total) by mouth 2 (two) times daily with a meal., Disp: 60 tablet, Rfl: 1 .  multivitamin (RENA-VIT) TABS tablet, Take 1 tablet by mouth at bedtime., Disp: 30 tablet, Rfl: 1 .  pantoprazole (PROTONIX) 40 MG tablet, Take 1 tablet (40 mg total) by mouth 2 (two) times daily., Disp: 60 tablet, Rfl: 1 .  phenytoin (DILANTIN) 100 MG ER capsule, Take 1 capsule (100 mg total) by mouth 3 (three) times daily., Disp: 90 capsule, Rfl: 0 .  warfarin (COUMADIN) 5 MG tablet, Take 1.5 tablets (7.5 mg total) by mouth daily at 6 PM. (Patient not taking:  Reported on 06/19/2016), Disp: 45 tablet, Rfl: 1  Allergies:  Allergies  Allergen Reactions  . Codeine Rash and Other (See Comments)    Unknown reaction (patient says it was more serious than just a rash, but he can't remember what happened)    Past Medical History, Surgical history, Social history, and Family History were reviewed and updated.  Physical Exam: Blood pressure 113/84, pulse 65, temperature 98.9 F (37.2 C), temperature source Oral, resp. rate 18, height 5' 11"  (1.803 m), weight 213 lb 14.4 oz (97 kg), SpO2 100 %. ECOG: 1 General appearance: A comfortable-appearing gentleman without distress. Head: Normocephalic, without obvious abnormality no oral thrush noted. Neck: no adenopathy Lymph nodes: Cervical, supraclavicular, and axillary nodes normal. Heart:regular rate and rhythm, S1, S2 normal, no murmur, click, rub or gallop Lung:chest clear, no wheezing, rales, normal symmetric air entry. No dullness to percussion. Abdomin: soft, non-tender, without masses or organomegaly. No rebound or guarding. EXT:no erythema, induration, or nodules Right-sided upper extremity weakness noted. He is able to ambulate without any difficulties.  Lab Results: Lab Results  Component Value Date   WBC 12.9 (H) 07/14/2016   HGB 12.0 (L) 07/14/2016   HCT 33.7 (L) 07/14/2016   MCV 82.0 07/14/2016   PLT 269 07/14/2016     Chemistry      Component Value Date/Time   NA 135 07/14/2016 1818   NA 137 07/08/2016 0921   K 4.1 07/14/2016 1818   K 4.5 07/08/2016 0921   CL 99 (L) 07/14/2016 1818   CO2 20 (L) 07/14/2016 1818   CO2 24 07/08/2016 0921   BUN 34 (H) 07/14/2016 1818   BUN 38.0 (H) 07/08/2016 0921   CREATININE 9.21 (H) 07/14/2016 1818   CREATININE 10.0 (HH) 07/08/2016 0921      Component Value Date/Time   CALCIUM 7.8 (L) 07/14/2016 1818   CALCIUM 8.6 07/08/2016 0921   ALKPHOS 94 07/14/2016 1818   ALKPHOS 109 07/08/2016 0921   AST 31 07/14/2016 1818   AST 21 07/08/2016 0921    ALT 32 07/14/2016 1818   ALT 26 07/08/2016 0921   BILITOT 1.1 07/14/2016 1818   BILITOT 0.96 07/08/2016 0921      Results for Isaiah [name], Isaiah [name] (MRN [id number]) as of 07/15/2016 08:58  Ref. Range 04/08/2016 09:18 07/08/2016 09:21  IgG (Immunoglobin G), Serum Latest Ref Range: 700 - 1600 mg/dL 3,556 (H) 3,422 (H)   Results for Isaiah [name], Isaiah [name] (MRN [id number]) as of 07/15/2016 08:58  Ref. Range 04/08/2016 09:18 07/08/2016 09:21  M Protein SerPl Elph-Mcnc Latest Ref Range: Not Observed g/dL 2.5 (H) 2.5 (H)      EXAM: METASTATIC BONE SURVEY  COMPARISON:  Metastatic bone survey of October 20, 2014  FINDINGS: Calvarium: There are subtle subcentimeter lucencies in the mid and posterior parietal bone which appears stable.  Spine: The vertebral bodies are preserved in height. There is mild degenerative disc disease of the mid cervical and throughout the thoracic spine and in the lower lumbar spine. No lytic or blastic lesions are observed.  Chest and upper extremities: The lungs are well-expanded. The interstitial  markings are coarse though stable. The heart and pulmonary vascularity are normal. The dialysis catheter tip projects over the distal third of the SVC new just proximal to the cavoatrial junction. The mediastinum is normal in width. The observed ribs are unremarkable. The clavicles exhibit no lytic nor blastic lesions. No lytic or blastic lesions are observed within the humeri or within the radii or ulnae. There are degenerative changes of the right shoulder  Pelvis and lower extremities: No definite abnormal lucency is observed within the bony pelvis. Specific attention to the superior pubic ramus does not reveal definite lytic lesions. No definite lytic lesions are observed within the lower extremities.  IMPRESSION: Stable tiny lucencies within the parietal bone are observed. Previously demonstrated lucencies in the humeri and femurs and superior pubic rami are  not clearly evident today. No findings suspicious for progressive bony involvement by known myeloma.     Impression and Plan: This is a 50 year old gentleman with the following issues:   1. Monoclonal gammopathy, IgG subtype representing monoclonal gammopathy of undetermined significance versus smoldering multiple myeloma. He had 2 bone marrow biopsies done which showed plasma cell disorder related between 7-13%. His IgG level continued to be relatively stable in the last 3 years around 2600-2800. His M spike had increased close to 2.5 g/dL.  His repeat staging workup obtained in October 2017 was personally reviewed and showed relatively stable protein studies and no evidence of lytic bone lesions. I recommended repeat a bone marrow biopsy in February 2018 to complete the staging workup. It remains unclear if he has active myeloma but at this point it appears to be smoldering myeloma and likely does not require treatment but certainly at risk of needing active treatments.  Given his other comorbid conditions, continued observation is warranted at this time. Instituting anti-myeloma therapy would be recommended he develops symptomatic illness.   2. Anemia, which is multifactorial and definitely has an element of hemoglobinopathy. He will be receiving growth factor support with hemodialysis. His last hemoglobin is within normal range.  3. Renal insufficiency: Likely related to long-standing hypertension and diabetes and less likely a plasma cell disorder. He is receiving hemodialysis at this time.  4. Recurrent thrombosis: He is off anticoagulation at this time because of recurrent GI bleed..  5. Follow-up: Will be in 3 months to follow-up on his clinical status.    FDVOUZ,HQUIQ, MD 11/7/20179:14 AM

## 2016-07-15 NOTE — Patient Instructions (Signed)
  Please complete the assigned speech therapy homework and return it to your next session.

## 2016-07-16 DIAGNOSIS — N186 End stage renal disease: Secondary | ICD-10-CM | POA: Diagnosis not present

## 2016-07-16 DIAGNOSIS — E1129 Type 2 diabetes mellitus with other diabetic kidney complication: Secondary | ICD-10-CM | POA: Diagnosis not present

## 2016-07-16 DIAGNOSIS — D689 Coagulation defect, unspecified: Secondary | ICD-10-CM | POA: Diagnosis not present

## 2016-07-16 DIAGNOSIS — D631 Anemia in chronic kidney disease: Secondary | ICD-10-CM | POA: Diagnosis not present

## 2016-07-16 DIAGNOSIS — D509 Iron deficiency anemia, unspecified: Secondary | ICD-10-CM | POA: Diagnosis not present

## 2016-07-16 DIAGNOSIS — T8249XA Other complication of vascular dialysis catheter, initial encounter: Secondary | ICD-10-CM | POA: Diagnosis not present

## 2016-07-17 ENCOUNTER — Encounter: Payer: Self-pay | Admitting: Occupational Therapy

## 2016-07-18 ENCOUNTER — Telehealth: Payer: Self-pay | Admitting: Oncology

## 2016-07-18 ENCOUNTER — Telehealth: Payer: Self-pay | Admitting: Nurse Practitioner

## 2016-07-18 DIAGNOSIS — T8249XA Other complication of vascular dialysis catheter, initial encounter: Secondary | ICD-10-CM | POA: Diagnosis not present

## 2016-07-18 DIAGNOSIS — D509 Iron deficiency anemia, unspecified: Secondary | ICD-10-CM | POA: Diagnosis not present

## 2016-07-18 DIAGNOSIS — D689 Coagulation defect, unspecified: Secondary | ICD-10-CM | POA: Diagnosis not present

## 2016-07-18 DIAGNOSIS — D631 Anemia in chronic kidney disease: Secondary | ICD-10-CM | POA: Diagnosis not present

## 2016-07-18 DIAGNOSIS — E1129 Type 2 diabetes mellitus with other diabetic kidney complication: Secondary | ICD-10-CM | POA: Diagnosis not present

## 2016-07-18 DIAGNOSIS — N186 End stage renal disease: Secondary | ICD-10-CM | POA: Diagnosis not present

## 2016-07-18 NOTE — Telephone Encounter (Signed)
Telephone call to the patient's wife. She is concerned about him being on Dilantin long term since he already has some memory issues. She wants to discuss adding Lamictal at his visit on Thursday. Made her aware that this medication needs titrated and then the Dilantin can be slowly weaned off.

## 2016-07-18 NOTE — Telephone Encounter (Signed)
[name] called, would like to speak with NP, Hoyle Sauer regarding concerns she has regarding husband's last seizure. (Upcoming appointment with Aurea Graff 11/16).

## 2016-07-18 NOTE — Telephone Encounter (Signed)
Spoke with patient wife re February appointments.

## 2016-07-21 ENCOUNTER — Encounter: Payer: Self-pay | Admitting: Occupational Therapy

## 2016-07-21 ENCOUNTER — Ambulatory Visit: Payer: 59 | Admitting: Occupational Therapy

## 2016-07-21 DIAGNOSIS — I69351 Hemiplegia and hemiparesis following cerebral infarction affecting right dominant side: Secondary | ICD-10-CM

## 2016-07-21 DIAGNOSIS — R278 Other lack of coordination: Secondary | ICD-10-CM

## 2016-07-21 DIAGNOSIS — N186 End stage renal disease: Secondary | ICD-10-CM | POA: Diagnosis not present

## 2016-07-21 DIAGNOSIS — R41841 Cognitive communication deficit: Secondary | ICD-10-CM | POA: Diagnosis not present

## 2016-07-21 DIAGNOSIS — I69315 Cognitive social or emotional deficit following cerebral infarction: Secondary | ICD-10-CM

## 2016-07-21 DIAGNOSIS — R471 Dysarthria and anarthria: Secondary | ICD-10-CM | POA: Diagnosis not present

## 2016-07-21 DIAGNOSIS — D509 Iron deficiency anemia, unspecified: Secondary | ICD-10-CM | POA: Diagnosis not present

## 2016-07-21 DIAGNOSIS — T8249XA Other complication of vascular dialysis catheter, initial encounter: Secondary | ICD-10-CM | POA: Diagnosis not present

## 2016-07-21 DIAGNOSIS — R482 Apraxia: Secondary | ICD-10-CM

## 2016-07-21 DIAGNOSIS — R2681 Unsteadiness on feet: Secondary | ICD-10-CM

## 2016-07-21 DIAGNOSIS — M6281 Muscle weakness (generalized): Secondary | ICD-10-CM | POA: Diagnosis not present

## 2016-07-21 DIAGNOSIS — D689 Coagulation defect, unspecified: Secondary | ICD-10-CM | POA: Diagnosis not present

## 2016-07-21 DIAGNOSIS — E1129 Type 2 diabetes mellitus with other diabetic kidney complication: Secondary | ICD-10-CM | POA: Diagnosis not present

## 2016-07-21 DIAGNOSIS — Z5181 Encounter for therapeutic drug level monitoring: Secondary | ICD-10-CM | POA: Diagnosis not present

## 2016-07-21 MED FILL — VIMPAT 150 MG TABLET: 150 | 30 days supply | Qty: 60 | Fill #1

## 2016-07-21 NOTE — Therapy (Signed)
[facility] [address] [place], [place], [address] Phone: [phone]   Fax:  [phone]  Occupational Therapy Treatment  Patient Details  Name: Isaiah [name] MRN: [id number] Date of Birth: 1966-02-28 Referring Provider: Alysia Penna  Encounter Date: 07/21/2016      OT End of Session - 07/21/16 1042    Visit Number 14   Number of Visits 22   Date for OT Re-Evaluation 08/20/16   Authorization Type medicare   Authorization Time Period "$20 co pay, no visit limit, no auth required"   Authorization - Visit Number 14   Authorization - Number of Visits 20   OT Start Time 0930   OT Stop Time 1015   OT Time Calculation (min) 45 min      Past Medical History:  Diagnosis Date  . A-fib ([place])   . Acute renal failure ([place]) 07/2011  . Anemia   . Asthma   . Diabetes mellitus without complication ([place])   . ESRD (end stage renal disease) on dialysis ([place])   . Gout   . History of recent blood transfusion 10/27/14   2 Units PRBC's  . Hyperkalemia 07/2011  . Hypertension   . Monoclonal gammopathies   . Monoclonal gammopathy   . OSA on CPAP   . Pulmonary embolism ([place]) 07/2011   a. Tx with Coumadin for 6 months (unknown cause per patient).  . Pulmonary embolism ([place]) 07/2011; 09/27/2014   a. Bilat PE 07/2011 - unclear cause, tx with 6 months Coumadin.;   . Seizures ([place])   . Sickle cell-thalassemia disease ([place])    a. Sickle cell trait.  . Sleep apnea    a. uses CPAP.  [name] Stroke [facility])     Past Surgical History:  Procedure Laterality Date  . AV FISTULA PLACEMENT Right 12/05/2015   Procedure: INSERTION OF ARTERIOVENOUS (AV) GORE-TEX GRAFT ARM;  Surgeon: Elam Dutch, MD;  Location: [facility];  Service: Vascular;  Laterality: Right;  . BONE MARROW BIOPSY    . CHOLECYSTECTOMY    . CHOLECYSTECTOMY  1990's?  . EYE SURGERY Right   . INSERTION OF DIALYSIS CATHETER Right 10/16/2015   Procedure: INSERTION OF PALINDROME  DIALYSIS CATHETER ;  Surgeon: Elam Dutch, MD;  Location: [facility];  Service: Vascular;  Laterality: Right;  . OTHER SURGICAL HISTORY     Retinal surgery  . PARS PLANA VITRECTOMY  02/17/2012   Procedure: PARS PLANA VITRECTOMY WITH 25 GAUGE;  Surgeon: Hayden Pedro, MD;  Location: [facility];  Service: Ophthalmology;  Laterality: Right;  . PERIPHERAL VASCULAR CATHETERIZATION N/A 03/20/2016   Procedure: A/V Shuntogram/Fistulagram;  Surgeon: Conrad Huntley, MD;  Location: [facility];  Service: Cardiovascular;  Laterality: N/A;    There were no vitals filed for this visit.      Subjective Assessment - 07/21/16 0940    Subjective  We talked about a splint for this finger.   Pertinent History recent seizures and addition of Dilantin   Patient Stated Goals to use right hand better   Currently in Pain? No/denies   Pain Score 0-No pain                      OT Treatments/Exercises (OP) - 07/21/16 0001      ADLs   ADL Comments Reviewed remaining goal, and plan for Occupational Therapy.  Reinforced the importance of patient working on therapy goals and exercises on his own - outside of therapy,  and the importance of this for optimal results in terms of hand functioning.  Patient agreeable to completing HEP - coordination and therapy putty to improve functional use of right hand.  Patient has had some recent medical issues - seizures and GI Bleed, in addition to longstanding dialysis, and therefore has not been actively addressing UE HEP.       Splinting   Splinting Patient issued a low tension extension splint for right index finger. Patient lacks 20 degrees of active extension in PIP joint.  With splint - patient lacks ~ 10 degrees of PIP extension.       Manual Therapy   Manual Therapy Joint mobilization   Manual therapy comments right wrist and index finger.     Joint Mobilization Patient with a 10 degree discrepencancy in both wrist flexion and extension in right wrist as  compared to elft wrist.  After traction / joint mobilization and activation only 5 degree difference in wrist extension, and 2-3 degree difference in wrist flexion.                  OT Education - 07/21/16 1041    Education Details splint wearing schedule, importance of daily HEP to improve use of right UE   Person(s) Educated Patient   Methods Explanation;Demonstration   Comprehension Verbalized understanding;Returned demonstration          OT Short Term Goals - 05/05/16 0901      OT SHORT TERM GOAL #1   Title I with initiall HEP- 03/20/2016   Time 4   Period Weeks   Status Achieved     OT SHORT TERM GOAL #2   Title Pt will perform all basic ADLS with supervision.   Time 4   Period Weeks   Status Achieved     OT SHORT TERM GOAL #3   Title Patient will demonstrate improved functional strength in right UE as evidenced by brushing teeth with dominant right hand   Time 4   Period Weeks   Status Achieved     OT SHORT TERM GOAL #4   Title Pt will demonstrate ability to retrieve a light weight (less than 1 lb) object at 120 shoulder flexion with RUE.   Time 4   Period Weeks   Status Achieved  3 pound object     OT SHORT TERM GOAL #5   Title Patient will demostrate improved coordination for right hand as evidenced by 15 on box and blocks test   Time 4   Period Weeks   Status Achieved  25 on 03/12/2016     OT SHORT TERM GOAL #6   Title Pt will report feeding himself with RUE 75% of the time.   Time 4   Period Weeks   Status Achieved           OT Long Term Goals - 07/21/16 1046      OT LONG TERM GOAL #1   Title I with updated HEP.- 05/29/2016   Status Not Met     OT LONG TERM GOAL #2   Title Pt will demonstrate ability to write his name legibily.   Status Not Met     OT LONG TERM GOAL #3   Title Patient will demonstrate adequate strength and coordination in dominant right hand to tighten laces and tie shoes using bilateral technique   Status Achieved      OT LONG TERM GOAL #4   Title Assess 9 hole peg test and determine goal  Status Achieved     OT LONG TERM GOAL #5   Title Pt will demonstrate RUE grip strength of 20+ lbs for increased functional use.   Status Achieved     OT LONG TERM GOAL #6   Title Pt will perform all basic ADLs and simple meal prep modified independently.   Status Achieved               Plan - 07/21/16 1043    Clinical Impression Statement Patient has had a much longer break from therapy than expected, and has goals remaining in improving active movement and functional use of his right UE.  Plan to recertify patient for additional visits to further improve hand use.     Rehab Potential Good   Clinical Impairments Affecting Rehab Potential aphasia, L inattention, cognitive impairment   OT Frequency 2x / week   OT Duration 4 weeks   OT Treatment/Interventions Self-care/ADL training;Therapeutic exercise;Patient/family education;Balance training;Splinting;Manual Therapy;Neuromuscular education;Ultrasound;Therapeutic exercises;Therapeutic activities;DME and/or AE instruction;Parrafin;Cryotherapy;Electrical Stimulation;Fluidtherapy;Scar mobilization;Cognitive remediation/compensation;Moist Heat;Contrast Bath;Passive range of motion;Visual/perceptual remediation/compensation;Functional Mobility Training   Plan Manual wrist - follow with active extension / flexion, NMR RUE - hand, wrist, forearm, check extension spring splint   OT Home Exercise Plan Education issued:  12/31/15 initial HEP (coordination, cane ex);  added putty on 02/26/2016   Consulted and Agree with Plan of Care Patient      Patient will benefit from skilled therapeutic intervention in order to improve the following deficits and impairments:  Cardiopulmonary status limiting activity, Decreased activity tolerance, Decreased balance, Decreased cognition, Decreased endurance, Decreased coordination, Decreased strength, Increased edema, Impaired tone,  Impaired UE functional use, Impaired vision/preception, Increased fascial restricitons  Visit Diagnosis: Other lack of coordination - Plan: Ot plan of care cert/re-cert  Muscle weakness (generalized) - Plan: Ot plan of care cert/re-cert  Hemiplegia and hemiparesis following cerebral infarction affecting right dominant side ([place]) - Plan: Ot plan of care cert/re-cert  Cognitive social or emotional deficit following cerebral infarction - Plan: Ot plan of care cert/re-cert  Unsteadiness on feet - Plan: Ot plan of care cert/re-cert  Apraxia - Plan: Ot plan of care cert/re-cert    Problem List Patient Active Problem List   Diagnosis Date Noted  . Hemiparesis affecting right side as late effect of cerebrovascular accident ([place]) 02/28/2016  . Aphasia, late effect of cerebrovascular disease 02/28/2016  . Acute encephalopathy 01/08/2016  . Seizures ([place]) 01/08/2016  . History of ischemic left MCA stroke 01/08/2016  . Right sided weakness 01/08/2016  . Chronic combined systolic and diastolic CHF (congestive heart failure) ([place]) 01/08/2016  . Atrial fibrillation ([place]) 01/08/2016  . Leukocytosis 01/08/2016  . Benign essential HTN 01/08/2016  . Anemia of chronic disease 01/08/2016  . Controlled type 2 diabetes mellitus with diabetic nephropathy, without long-term current use of insulin ([place]) 01/08/2016  . History of DVT (deep vein thrombosis) 01/08/2016  . ESRD (end stage renal disease) on dialysis [facility])     Isaiah [name], OTR/L 07/21/2016, 10:54 AM  [facility] [address] [place], [place], [address] Phone: [phone]   Fax:  [phone]  Name: Isaiah [name] [facility] MRN: [id number] Date of Birth: 06/05/1966

## 2016-07-22 ENCOUNTER — Encounter: Payer: Self-pay | Admitting: Occupational Therapy

## 2016-07-23 DIAGNOSIS — T8249XA Other complication of vascular dialysis catheter, initial encounter: Secondary | ICD-10-CM | POA: Diagnosis not present

## 2016-07-23 DIAGNOSIS — D509 Iron deficiency anemia, unspecified: Secondary | ICD-10-CM | POA: Diagnosis not present

## 2016-07-23 DIAGNOSIS — D689 Coagulation defect, unspecified: Secondary | ICD-10-CM | POA: Diagnosis not present

## 2016-07-23 DIAGNOSIS — N186 End stage renal disease: Secondary | ICD-10-CM | POA: Diagnosis not present

## 2016-07-23 DIAGNOSIS — E1129 Type 2 diabetes mellitus with other diabetic kidney complication: Secondary | ICD-10-CM | POA: Diagnosis not present

## 2016-07-23 DIAGNOSIS — Z5181 Encounter for therapeutic drug level monitoring: Secondary | ICD-10-CM | POA: Diagnosis not present

## 2016-07-24 ENCOUNTER — Encounter: Payer: Self-pay | Admitting: Nurse Practitioner

## 2016-07-24 ENCOUNTER — Ambulatory Visit (INDEPENDENT_AMBULATORY_CARE_PROVIDER_SITE_OTHER): Payer: 59 | Admitting: Nurse Practitioner

## 2016-07-24 ENCOUNTER — Ambulatory Visit: Payer: 59 | Admitting: Occupational Therapy

## 2016-07-24 ENCOUNTER — Encounter: Payer: Self-pay | Admitting: Neurology

## 2016-07-24 VITALS — BP 116/76 | HR 66 | Wt 215.8 lb

## 2016-07-24 DIAGNOSIS — R482 Apraxia: Secondary | ICD-10-CM

## 2016-07-24 DIAGNOSIS — Z8673 Personal history of transient ischemic attack (TIA), and cerebral infarction without residual deficits: Secondary | ICD-10-CM

## 2016-07-24 DIAGNOSIS — Z86718 Personal history of other venous thrombosis and embolism: Secondary | ICD-10-CM

## 2016-07-24 DIAGNOSIS — I69351 Hemiplegia and hemiparesis following cerebral infarction affecting right dominant side: Secondary | ICD-10-CM | POA: Diagnosis not present

## 2016-07-24 DIAGNOSIS — I69920 Aphasia following unspecified cerebrovascular disease: Secondary | ICD-10-CM | POA: Diagnosis not present

## 2016-07-24 DIAGNOSIS — R531 Weakness: Secondary | ICD-10-CM

## 2016-07-24 DIAGNOSIS — R471 Dysarthria and anarthria: Secondary | ICD-10-CM | POA: Diagnosis not present

## 2016-07-24 DIAGNOSIS — I10 Essential (primary) hypertension: Secondary | ICD-10-CM | POA: Diagnosis not present

## 2016-07-24 DIAGNOSIS — R41841 Cognitive communication deficit: Secondary | ICD-10-CM | POA: Diagnosis not present

## 2016-07-24 DIAGNOSIS — R569 Unspecified convulsions: Secondary | ICD-10-CM

## 2016-07-24 DIAGNOSIS — M6281 Muscle weakness (generalized): Secondary | ICD-10-CM | POA: Diagnosis not present

## 2016-07-24 DIAGNOSIS — I69315 Cognitive social or emotional deficit following cerebral infarction: Secondary | ICD-10-CM | POA: Diagnosis not present

## 2016-07-24 DIAGNOSIS — R2681 Unsteadiness on feet: Secondary | ICD-10-CM

## 2016-07-24 DIAGNOSIS — R278 Other lack of coordination: Secondary | ICD-10-CM

## 2016-07-24 DIAGNOSIS — I639 Cerebral infarction, unspecified: Secondary | ICD-10-CM

## 2016-07-24 MED ORDER — LAMOTRIGINE 100 MG PO TABS: ORAL_TABLET | ORAL | 1 refills | Status: DC

## 2016-07-24 MED FILL — lamoTRIgine 100 MG TABS: 100 | 90 days supply | Qty: 135 | Fill #0

## 2016-07-24 NOTE — Progress Notes (Signed)
Fax confirmation received lamictal at [facility] [phone].

## 2016-07-24 NOTE — Patient Instructions (Addendum)
Blood pressure well controlled today 116/76 Continue Vimpat 150 twice daily Add on Lamictal 50 mg daily for 2 weeks, then increase to 50 mg a.m. and p.m. for 2 weeks, then increase to 50 mg a.m. and 100 mg p.m. After being on that dose for 2 weeks may taper Dilantin by 100 mg every 2 weeks until medication is discontinued Continue home exercise program once therapies are concluded Continue follow-up with GI physician, Dr. Ave Filter off aspirin and Coumadin at present Follow-up in 3 months, next visit with Dr. Leonie Man

## 2016-07-24 NOTE — Patient Instructions (Signed)
Isaiah [name] right forearm, just above his fistula, Isaiah his right hand around his hand (as when shaking hands) Keeping forearm still gently pull hand away from forearm - creating a space for wrist bones. Gently keep this tension and then stretch hand so fingers point toward ceiling and then floor, continue with hand moving side to side (like a windshield wiper) Follow with Shah helping to bend his right wrist back (fingers moving upward toward ceiling) Complete for at least 30 seconds 2-3 times each day.

## 2016-07-24 NOTE — Progress Notes (Signed)
[facility]  PATIENT: [place] DOB: 10/10/1965   REASON FOR VISIT: Follow-up for recent seizure event after dialysis , history of stroke, right hemiparesis  HISTORY FROM: Patient wife     HISTORY OF PRESENT ILLNESS HISTORY PS:Mr. Bushey is a 50 year old African-American gentleman seen today for first office follow-up visit following hospital consultation with Dr. Erlinda Hong in Oct 23, 2015. He is accompanied by his wife. He has a complicated PMH of DM type 2 with diabetic neuropathy, Sickle cell-thalassemia,  DVT and PE x 2 in 07/2011 and 1/ 2016 off coumadin due to GIB, OSA, CKD stage V, MGUS and anemia .He was recently admitted to [facility] in [place] on 09/16/15 with N/V and blood stools with acute on chronic renal failure due to acute pancreatitis. He was treated with supportive care, IV bicarb as well as multiple units PRBC and EGD with severe stage IV erosive esophagitis with gastritis.HD initiated on 01/09 due to poor recover with decrease in UOP. He developed hypoxic respiratory failure and was found to have RUL/RML PE as well as left popliteal DVT. He was treated with IV heparin. IVC filter was placed around 09/21/15 and post procedure was found to have right hand weakness. CT head on 09/26/15 reported possible subacut right MCA/PCA and left cerebellar infarcts which were not correlate to his right hand weakness. Neurology was consulted for input and EMG showed evidence of profound neuropathy. He had worsening of symptoms with increase in right sided weakness, facial droop speech difficulty on 09/27/15. MRI on 09/29/15 showedscattered right MCA and left MCA, left thalamus and b/l cerebellar infarcts. The infarcts on CT 09/26/15 likely to be acute too. On 09/30/15, he had worsening of aphasia and repeat MRI showed extension of left MCA frontal infarcts. MRA head showed left M2 superior branch occlusion. Echo with positive bubble study  and TEE positive for PFO. Hypercoagulation studies reported to be negative. IV heparin was changed to Lovenox due to recurrent embolic strokes on heparin.  He was then transferred here to [facility] rehab on 10/12/15. He was reported to have small amount of blood in stool on admission. Continued HD for CKD after admission. Due to CKD on HD, his lovenox discontinued and put back on heparin IV. His hematologist Dr. Alen Blew was consulted for anticoagulation use. He commented "As far as long-term anticoagulation, I feel be reasonable to consider aspirin alone and hope that IVC filter will prevent any future embolic strokes from occurring. An alternative strategy is to consider warfarin and follow closely for signs or symptoms of bleeding." since admission, Patient made significant improvement on his aphasia and right side weakness, and has been able to walk with assistance and speak much more fluent. His H&H  remained stable and no obvious bleeding reported. Dr. Erlinda Hong recommended starting warfarin. Patient has tolerated warfarin well without any more episodes of bleeding and hematocrit has remained stable. He is currently participating in outpatient physical, occupational and speech therapy and making good improvement. His able to states speaks short sentences now with some word hesitancy. He still has significant weakness in his right hand and grip but is able to walk with a cane. His balance is good is had no major falls. He does complain of pain and stiffness in his legs and hand particularly at night at times he has to wake up because of discomfort. He is being followed in the rehabilitation clinic but currently is not taking any medications for spasticity. His sugars have all  been under good control. Update 03/25/2016 PS: He returns for follow-up after last visit with me 3 months ago. He is accompanied today by his son. He was hospitalized on 4/312017 with a seizure. He was started on Vimpat 100 twice daily. EEG showed  generalized slowing without definite epileptiform activity. Limited MRI scan the brain was obtained on 12/3115 which showed multiple late subacute to chronic infarcts and her repeat MRI on 5/117 showed resolution and no definite new acute infarct. Patient was initially felt to be candidate for rehabilitation but after prolonged postictal phase for several days he made rapid improvement and was discharged home. Doing well. His able to walk without assistance. His 2 has mild expressive aphasia but speech is improving. Still has mild right-sided weakness. He walks with dragging his right leg. Is tolerating Vimpat well without side effects. He remains on warfarin and is tolerating it well without bleeding or bruising. He states his fasting sugars 7 well controlled. He has no new complaints. UPDATE 10/12/2017CM [name], 50 year old male returns for follow-up. With medical history of right and left  CVA in January 2017 end-stage renal disease previous pulmonary emboli, seizure disorder ,diabetes, hypertension previously on Coumadin. He was on vacation in [place] in September and admitted on 05/28/16 after a dialysis treatment he had a seizure event and was hospitalized he was also found to be anemic with a hemoglobin of 6.8 indicating positive stools in the emergency room he was admitted for complete workup. He received multiple blood transfusions.  He received a loading dose of Keppra in the emergency room and his Vimpat dose was increased to 150 mg twice daily. EEG performed was mildly abnormal due to the presence of mild intermittent generalized theta slowing which suggest mild encephalopathy of nonspecific etiology. He was hospital for 12 days and return to [place]. He denies further seizure events. He denies further stroke or TIA symptoms Continues  mild expressive aphasia and mild right-sided weakness. He returns for reevaluation.  UPDATE 11/16/2017CM Mr. Puthoff, 50 year old male returns for follow-up he has  a history of right and left CVA in January 2017, end-stage renal disease on dialysis, seizure disorder, diabetes hypertension recent GI bleed while vacationing in [place] September 2017 after a dialysis treatment he had a seizure event and was hospitalized with hemoglobin of 6.8. His aspirin and Coumadin remain on hold. His GI doctor is Dr. Michail Sermon who he will see again in the next week or so. He is currently on Vimpat 150 twice daily. He had another seizure after dialysis on 07/14/2016 and was taken to the emergency room where he was placed on Dilantin 300 mg daily. Wife does not want him to be on Dilantin long term is wanting him to switch to a different medication. Fortunately he denies further stroke or TIA symptoms. He remains an speech and occupational therapy and he continues to have mild right hemiparesis and mild expressive aphasia. He returns to the office today for reevaluation REVIEW OF SYSTEMS: Full 14 system review of systems performed and notable only for those listed, all others are neg:  Constitutional: neg  Cardiovascular: neg Ear/Nose/Throat: neg  Skin: neg Eyes: neg Respiratory: neg Gastroitestinal: neg  Hematology/Lymphatic: neg  Endocrine: neg Musculoskeletal:neg Allergy/Immunology: neg Neurological: Seizure, speech difficulty, mild memory loss Psychiatric: neg Sleep : neg   ALLERGIES: Allergies  Allergen Reactions  . Codeine Rash and Other (See Comments)    Unknown reaction (patient says it was more serious than just a rash, but he can't remember what  happened)    HOME MEDICATIONS: Outpatient Medications Prior to Visit  Medication Sig Dispense Refill  . albuterol (PROAIR HFA) 108 (90 Base) MCG/ACT inhaler Inhale two puffs every 4-6 hours if needed for cough or wheeze 1 Inhaler 1  . cinacalcet (SENSIPAR) 60 MG tablet Take 60 mg by mouth every evening.     . fluticasone furoate-vilanterol (BREO ELLIPTA) 100-25 MCG/INH AEPB Inhale one puff once daily to prevent  cough or wheeze (Patient taking differently: Inhale 1 puff into the lungs daily. Inhale one puff once daily to prevent cough or wheeze) 60 each 5  . Insulin Glargine (LANTUS) 100 UNIT/ML Solostar Pen Inject 10 Units into the skin daily at 10 pm. (Patient taking differently: Inject 20 Units into the skin daily at 10 pm. ) 15 mL 1  . Lacosamide (VIMPAT) 150 MG TABS Take 1 tablet (150 mg total) by mouth 2 (two) times daily. 60 tablet 5  . metoprolol tartrate (LOPRESSOR) 25 MG tablet Take 0.5 tablets (12.5 mg total) by mouth 2 (two) times daily. 60 tablet 0  . midodrine (PROAMATINE) 2.5 MG tablet Take 1 tablet (2.5 mg total) by mouth 2 (two) times daily with a meal. 60 tablet 1  . multivitamin (RENA-VIT) TABS tablet Take 1 tablet by mouth at bedtime. 30 tablet 1  . pantoprazole (PROTONIX) 40 MG tablet Take 1 tablet (40 mg total) by mouth 2 (two) times daily. 60 tablet 1  . phenytoin (DILANTIN) 100 MG ER capsule Take 1 capsule (100 mg total) by mouth 3 (three) times daily. (Patient taking differently: Take 100 mg by mouth 2 (two) times daily. ) 90 capsule 0  . aspirin EC 81 MG tablet Take 1 tablet (81 mg total) by mouth daily. (Patient not taking: Reported on 07/24/2016) 100 tablet 0  . warfarin (COUMADIN) 5 MG tablet Take 1.5 tablets (7.5 mg total) by mouth daily at 6 PM. (Patient not taking: Reported on 07/24/2016) 45 tablet 1   No facility-administered medications prior to visit.     PAST MEDICAL HISTORY: Past Medical History:  Diagnosis Date  . A-fib ([place])   . Acute renal failure ([place]) 07/2011  . Anemia   . Asthma   . Diabetes mellitus without complication ([place])   . ESRD (end stage renal disease) on dialysis ([place])   . Gout   . History of recent blood transfusion 10/27/14   2 Units PRBC's  . Hyperkalemia 07/2011  . Hypertension   . Monoclonal gammopathies   . Monoclonal gammopathy   . OSA on CPAP   . Pulmonary embolism ([place]) 07/2011   a. Tx with Coumadin for 6 months (unknown cause per  patient).  . Pulmonary embolism ([place]) 07/2011; 09/27/2014   a. Bilat PE 07/2011 - unclear cause, tx with 6 months Coumadin.;   . Seizures ([place])   . Sickle cell-thalassemia disease ([place])    a. Sickle cell trait.  . Sleep apnea    a. uses CPAP.  [name] Stroke [facility])     PAST SURGICAL HISTORY: Past Surgical History:  Procedure Laterality Date  . AV FISTULA PLACEMENT Right 12/05/2015   Procedure: INSERTION OF ARTERIOVENOUS (AV) GORE-TEX GRAFT ARM;  Surgeon: Elam Dutch, MD;  Location: [facility];  Service: Vascular;  Laterality: Right;  . BONE MARROW BIOPSY    . CHOLECYSTECTOMY    . CHOLECYSTECTOMY  1990's?  . EYE SURGERY Right   . INSERTION OF DIALYSIS CATHETER Right 10/16/2015   Procedure: INSERTION OF PALINDROME DIALYSIS CATHETER ;  Surgeon: Jessy Oto  Fields, MD;  Location: [facility];  Service: Vascular;  Laterality: Right;  . OTHER SURGICAL HISTORY     Retinal surgery  . PARS PLANA VITRECTOMY  02/17/2012   Procedure: PARS PLANA VITRECTOMY WITH 25 GAUGE;  Surgeon: Hayden Pedro, MD;  Location: [facility];  Service: Ophthalmology;  Laterality: Right;  . PERIPHERAL VASCULAR CATHETERIZATION N/A 03/20/2016   Procedure: A/V Shuntogram/Fistulagram;  Surgeon: Conrad [place], MD;  Location: [facility];  Service: Cardiovascular;  Laterality: N/A;    FAMILY HISTORY: Family History  Problem Relation Age of Onset  . Hypertension Mother   . Diabetes Mother   . Stroke Father   . Sickle cell anemia Brother   . Diabetes type I Brother   . Kidney disease Brother     SOCIAL HISTORY: Social History   Social History  . Marital status: Married    Spouse name: sylvia  . Number of children: 1  . Years of education: college   Occupational History  . [place] History Main Topics  . Smoking status: Never Smoker  . Smokeless tobacco: Never Used  . Alcohol use No  . Drug use: No  . Sexual activity: Yes   Other Topics Concern  . Not on file   Social History Narrative    ** Merged History Encounter **          Vitals:   07/24/16 0904  BP: 116/76  Pulse: 66  Weight: 215 lb 12.8 oz (97.9 kg)   Body mass index is 30.1 kg/m. General: well developed, well nourished, seated, in no evident distress Head: head normocephalic and atraumatic.  Neck: supple with no carotid  bruits Cardiovascular: regular rate and rhythm, no murmurs Musculoskeletal: no deformity Skin:  no rash/petichiae Vascular:  Normal pulses all extremities   Neurological examination  Mental Status: Awake and fully alert. Oriented to place and time. Recent and remote memory intact. Attention span, concentration and fund of knowledge Is slightly impaired. Mood and affect appropriate. Mild expressive aphasia with word finding difficulties and dysfluency. Good comprehension, naming and repetition. Cranial Nerves: Fundoscopic exam not done.  . Pupils equal, briskly reactive to light. Extraocular movements full without nystagmus. Visual fields full to confrontation. Hearing intact. Moderate right lower facial weakness. Facial sensation intact. Face, tongue, palate moves normally and symmetrically.  Motor: Spastic right hemiparesis with 4/5 right shoulder and elbow strength with mild  weakness of the right grip and intrinsic hand muscles. 4/5 right lower extremity strength with spasticity and increased tone. Ankle dorsiflexors weak. Sensory.:   touch ,pinprick .position and vibratory sensation slightly diminished on the right compared to the left.  Coordination: Impaired on the right side and normal on the left  Gait and Station: Arises from chair without difficulty. Spastic hemiplegic gait , no assistive device Reflexes: 1+ and symmetric. Toes downgoing.    DIAGNOSTIC DATA (LABS, IMAGING, TESTING) - I reviewed patient records, labs, notes, testing and imaging myself where available.  Lab Results  Component Value Date   WBC 12.9 (H) 07/14/2016   HGB 12.0 (L) 07/14/2016   HCT 33.7 (L)  07/14/2016   MCV 82.0 07/14/2016   PLT 269 07/14/2016      Component Value Date/Time   NA 135 07/14/2016 1818   NA 137 07/08/2016 0921   K 4.1 07/14/2016 1818   K 4.5 07/08/2016 0921   CL 99 (L) 07/14/2016 1818   CO2 20 (L) 07/14/2016 1818   CO2 24 07/08/2016 8119  GLUCOSE 137 (H) 07/14/2016 1818   GLUCOSE 81 07/08/2016 0921   BUN 34 (H) 07/14/2016 1818   BUN 38.0 (H) 07/08/2016 0921   CREATININE 9.21 (H) 07/14/2016 1818   CREATININE 10.0 (HH) 07/08/2016 0921   CALCIUM 7.8 (L) 07/14/2016 1818   CALCIUM 8.6 07/08/2016 0921   PROT 9.6 (H) 07/14/2016 1818   PROT 9.9 (H) 07/08/2016 0921   PROT 9.3 (H) 07/08/2016 0921   ALBUMIN 3.9 07/14/2016 1818   ALBUMIN 3.7 07/08/2016 0921   AST 31 07/14/2016 1818   AST 21 07/08/2016 0921   ALT 32 07/14/2016 1818   ALT 26 07/08/2016 0921   ALKPHOS 94 07/14/2016 1818   ALKPHOS 109 07/08/2016 0921   BILITOT 1.1 07/14/2016 1818   BILITOT 0.96 07/08/2016 0921   GFRNONAA 6 (L) 07/14/2016 1818   GFRAA 7 (L) 07/14/2016 1818   Lab Results  Component Value Date   CHOL 128 10/24/2015   HDL 48 10/24/2015   LDLCALC 62 10/24/2015   TRIG 88 10/24/2015   CHOLHDL 2.7 10/24/2015   Lab Results  Component Value Date   HGBA1C 4.4 (L) 01/06/2016   Lab Results  Component Value Date   FXJOITGP49 826 10/24/2015   Lab Results  Component Value Date   TSH 0.896 01/06/2016     ASSESSMENT AND PLAN 50 year old African-American male with bilateral anterior and posterior circulation embolic infarcts in January 2017 secondary to paradoxical embolism from deep vein thrombosis, pulmonary embolism and patent foramen ovale while he was off Coumadin due to GI bleeding with prolonged hospitalization in [facility] but was transferred here for rehabilitation. Repeat admission in April 2017 for seizure. He has residual expressive aphasia and   spastic right hemiparesis . Vascular risk factors of diabetes, hypertension, sleep apnea. Repeat hospitalization  in September 2017 [place] for seizure events after dialysis and GI bleed. Another seizure on 07/14/2016 after dialysis, seen in the emergency room and placed on Dilantin.The patient is a current patient of Dr. Leonie Man  who is out of the office today . This note is sent to the work in doctor.     PLAN: Blood pressure well controlled today 116/76 Continue Vimpat 150 twice daily Add on Lamictal 50 mg daily for 2 weeks, then increase to 50 mg a.m. and p.m. for 2 weeks, then increase to 50 mg a.m. and 100 mg p.m. After being on that dose for 2 weeks may taper Dilantin by 100 mg every 2 weeks until medication is discontinued  Continue home exercise program once therapies are concluded Continue follow-up with GI physician, Dr. Michail Sermon remains off aspirin and Coumadin at present due to GI bleed 2 Follow-up in 3 months, next visit with Dr. Sloan Leiter, [facility], [facility], [facility] [address], [place], [place] [address] [phone]

## 2016-07-24 NOTE — Therapy (Signed)
[facility] [address] [place], [place], [address] Phone: [phone]   Fax:  [phone]  Occupational Therapy Treatment  Patient Details  Name: Isaiah [name] MRN: [id number] Date of Birth: [birth year] Referring Provider: Alysia Penna  Encounter Date: 07/24/2016      OT End of Session - 07/24/16 1613    Visit Number 15   Number of Visits 22   Date for OT Re-Evaluation 08/20/16   Authorization Type medicare   Authorization Time Period "$20 co pay, no visit limit, no auth required"   Authorization - Visit Number 15   Authorization - Number of Visits 20   OT Start Time 1533   OT Stop Time 1600   OT Time Calculation (min) 27 min   Activity Tolerance Patient tolerated treatment well   Behavior During Therapy [facility] for tasks assessed/performed      Past Medical History:  Diagnosis Date  . A-fib ([place])   . Acute renal failure ([place]) 07/2011  . Anemia   . Asthma   . Diabetes mellitus without complication ([place])   . ESRD (end stage renal disease) on dialysis ([place])   . Gout   . History of recent blood transfusion 10/27/14   2 Units PRBC's  . Hyperkalemia 07/2011  . Hypertension   . Monoclonal gammopathies   . Monoclonal gammopathy   . OSA on CPAP   . Pulmonary embolism ([place]) 07/2011   a. Tx with Coumadin for 6 months (unknown cause per patient).  . Pulmonary embolism ([place]) 07/2011; 09/27/2014   a. Bilat PE 07/2011 - unclear cause, tx with 6 months Coumadin.;   . Seizures ([place])   . Sickle cell-thalassemia disease ([place])    a. Sickle cell trait.  . Sleep apnea    a. uses CPAP.  [name] Stroke [facility])     Past Surgical History:  Procedure Laterality Date  . AV FISTULA PLACEMENT Right 12/05/2015   Procedure: INSERTION OF ARTERIOVENOUS (AV) GORE-TEX GRAFT ARM;  Surgeon: Elam Dutch, MD;  Location: [place];  Service: Vascular;  Laterality: Right;  . BONE MARROW BIOPSY    . CHOLECYSTECTOMY    . CHOLECYSTECTOMY   1990's?  . EYE SURGERY Right   . INSERTION OF DIALYSIS CATHETER Right 10/16/2015   Procedure: INSERTION OF PALINDROME DIALYSIS CATHETER ;  Surgeon: Elam Dutch, MD;  Location: [place];  Service: Vascular;  Laterality: Right;  . OTHER SURGICAL HISTORY     Retinal surgery  . PARS PLANA VITRECTOMY  02/17/2012   Procedure: PARS PLANA VITRECTOMY WITH 25 GAUGE;  Surgeon: Hayden Pedro, MD;  Location: [place];  Service: Ophthalmology;  Laterality: Right;  . PERIPHERAL VASCULAR CATHETERIZATION N/A 03/20/2016   Procedure: A/V Shuntogram/Fistulagram;  Surgeon: Conrad Adrian, MD;  Location: [place] CV LAB;  Service: Cardiovascular;  Laterality: N/A;    There were no vitals filed for this visit.      Subjective Assessment - 07/24/16 1539    Subjective  The splint has really helped   Patient is accompained by: Family member   Pertinent History recent seizures and addition of Dilantin   Patient Stated Goals to use right hand better   Currently in Pain? No/denies   Pain Score 0-No pain                      OT Treatments/Exercises (OP) - 07/24/16 0001      ADLs   ADL Comments Patient indicates that he is  ready to take a break from therapy - last long term goal has been met.  Wife present for OT session today to learn manual techniques to address patient's tight wrist.       Neurological Re-education Exercises   Other Exercises 1 Educated patient and wife about over recruitment with attempts to extend wrist leading to poor alignment of carpal bones - jamming proximal and distal rows.  Patient's wife able to distract and provided passive range of motion for wrist flexion / extension, ulnar and radial deviation and then follow with guided motion to reduce overactivation.  Patient responds well to deeper sensory input in controling wrist extension, and thus able to reduce tremor which occurs in open chain conditions.                  OT Education - 07/24/16 1612    Education  provided Yes   Education Details splint wear - when to discontinue, importance of daily HEP, and functional use of right hand to improve function, and to decrease stiffness / swelling   Person(s) Educated Patient;Spouse   Methods Explanation;Demonstration   Comprehension Verbalized understanding          OT Short Term Goals - 05/05/16 0901      OT SHORT TERM GOAL #1   Title I with initiall HEP- 03/20/2016   Time 4   Period Weeks   Status Achieved     OT SHORT TERM GOAL #2   Title Pt will perform all basic ADLS with supervision.   Time 4   Period Weeks   Status Achieved     OT SHORT TERM GOAL #3   Title Patient will demonstrate improved functional strength in right UE as evidenced by brushing teeth with dominant right hand   Time 4   Period Weeks   Status Achieved     OT SHORT TERM GOAL #4   Title Pt will demonstrate ability to retrieve a light weight (less than 1 lb) object at 120 shoulder flexion with RUE.   Time 4   Period Weeks   Status Achieved  3 pound object     OT SHORT TERM GOAL #5   Title Patient will demostrate improved coordination for right hand as evidenced by 15 on box and blocks test   Time 4   Period Weeks   Status Achieved  25 on 03/12/2016     OT SHORT TERM GOAL #6   Title Pt will report feeding himself with RUE 75% of the time.   Time 4   Period Weeks   Status Achieved           OT Long Term Goals - 07/24/16 1615      OT LONG TERM GOAL #1   Title I with updated HEP.- 05/29/2016   Status Achieved     OT LONG TERM GOAL #2   Title Pt will demonstrate ability to write his name legibily.   Status Achieved     OT LONG TERM GOAL #3   Title Patient will demonstrate adequate strength and coordination in dominant right hand to tighten laces and tie shoes using bilateral technique   Status Achieved     OT LONG TERM GOAL #4   Title Assess 9 hole peg test and determine goal   Status Achieved     OT LONG TERM GOAL #5   Title Pt will  demonstrate RUE grip strength of 20+ lbs for increased functional use.   Status Not Met  10 lbs     OT LONG TERM GOAL #6   Title Pt will perform all basic ADLs and simple meal prep modified independently.   Status Achieved               Plan - August 13, 2016 1613    Clinical Impression Statement Patient and wife have decided to take a break from therapy at this time to allow patient to continue to work on functional right hand use on his own.  Plan to re-evaluate patient in 6 months to further address finer components, neuromuscular reeducation and strength in right arm and hand.    Rehab Potential Good   Clinical Impairments Affecting Rehab Potential aphasia, L inattention, cognitive impairment   OT Frequency 2x / week   OT Duration 4 weeks   OT Treatment/Interventions Self-care/ADL training;Therapeutic exercise;Patient/family education;Balance training;Splinting;Manual Therapy;Neuromuscular education;Ultrasound;Therapeutic exercises;Therapeutic activities;DME and/or AE instruction;Parrafin;Cryotherapy;Electrical Stimulation;Fluidtherapy;Scar mobilization;Cognitive remediation/compensation;Moist Heat;Contrast Bath;Passive range of motion;Visual/perceptual remediation/compensation;Functional Mobility Training   Plan Discharge from OT.  Reeval in 6  mos.   OT Home Exercise Plan Education issued:  12/31/15 initial HEP (coordination, cane ex);  added putty on 02/26/2016, educated patient and wife in traction for right wrist   Recommended Other Services OT in 6 mos - ~May 2018   Consulted and Agree with Plan of Care Patient;Family member/caregiver      Patient will benefit from skilled therapeutic intervention in order to improve the following deficits and impairments:  Cardiopulmonary status limiting activity, Decreased activity tolerance, Decreased balance, Decreased cognition, Decreased endurance, Decreased coordination, Decreased strength, Increased edema, Impaired tone, Impaired UE functional  use, Impaired vision/preception, Increased fascial restricitons  Visit Diagnosis: Other lack of coordination  Muscle weakness (generalized)  Hemiplegia and hemiparesis following cerebral infarction affecting right dominant side (HCC)  Cognitive social or emotional deficit following cerebral infarction  Unsteadiness on feet  Apraxia      G-Codes - 13-Aug-2016 1616    Functional Assessment Tool Used skilled clinical judgement, box and blocks   Functional Limitation Carrying, moving and handling objects   Carrying, Moving and Handling Objects Current Status (V6945) At least 40 percent but less than 60 percent impaired, limited or restricted   Carrying, Moving and Handling Objects Goal Status (W3888) At least 20 percent but less than 40 percent impaired, limited or restricted   Carrying, Moving and Handling Objects Discharge Status [phone]) At least 40 percent but less than 60 percent impaired, limited or restricted      Problem List Patient Active Problem List   Diagnosis Date Noted  . Hemiparesis affecting right side as late effect of cerebrovascular accident ([place]) 02/28/2016  . Aphasia, late effect of cerebrovascular disease 02/28/2016  . Acute encephalopathy 01/08/2016  . Seizures ([place]) 01/08/2016  . History of ischemic left MCA stroke 01/08/2016  . Right sided weakness 01/08/2016  . Chronic combined systolic and diastolic CHF (congestive heart failure) ([place]) 01/08/2016  . Atrial fibrillation ([place]) 01/08/2016  . Leukocytosis 01/08/2016  . Benign essential HTN 01/08/2016  . Anemia of chronic disease 01/08/2016  . Controlled type 2 diabetes mellitus with diabetic nephropathy, without long-term current use of insulin ([place]) 01/08/2016  . History of DVT (deep vein thrombosis) 01/08/2016  . ESRD (end stage renal disease) on dialysis ([place])    OCCUPATIONAL THERAPY DISCHARGE SUMMARY  Visits from Start of Care: 15  Current functional level related to goals / functional  outcomes: Patient has shown significant improvement and participation in all aspects of ADL's.     Remaining deficits: Right hemiparesis,  decreased sensation right, poorly graded motor control, compromised medical status   Education / Equipment: HEP to address coordination and strength in RUE Plan: Patient agrees to discharge.  Patient goals were met. Patient is being discharged due to meeting the stated rehab goals.  ?????     Mariah Milling, OTR/L 07/24/2016, 4:17 PM  [facility] [address] [place], [place], [address] Phone: [phone]   Fax:  [phone]  Name: Isaiah [name] MRN: [id number] Date of Birth: 04-14-1966

## 2016-07-25 DIAGNOSIS — E1129 Type 2 diabetes mellitus with other diabetic kidney complication: Secondary | ICD-10-CM | POA: Diagnosis not present

## 2016-07-25 DIAGNOSIS — D509 Iron deficiency anemia, unspecified: Secondary | ICD-10-CM | POA: Diagnosis not present

## 2016-07-25 DIAGNOSIS — G4089 Other seizures: Secondary | ICD-10-CM | POA: Insufficient documentation

## 2016-07-25 DIAGNOSIS — N186 End stage renal disease: Secondary | ICD-10-CM | POA: Diagnosis not present

## 2016-07-25 DIAGNOSIS — T8249XA Other complication of vascular dialysis catheter, initial encounter: Secondary | ICD-10-CM | POA: Diagnosis not present

## 2016-07-25 DIAGNOSIS — Z5181 Encounter for therapeutic drug level monitoring: Secondary | ICD-10-CM | POA: Diagnosis not present

## 2016-07-25 DIAGNOSIS — D689 Coagulation defect, unspecified: Secondary | ICD-10-CM | POA: Diagnosis not present

## 2016-07-27 DIAGNOSIS — D509 Iron deficiency anemia, unspecified: Secondary | ICD-10-CM | POA: Diagnosis not present

## 2016-07-27 DIAGNOSIS — E1129 Type 2 diabetes mellitus with other diabetic kidney complication: Secondary | ICD-10-CM | POA: Diagnosis not present

## 2016-07-27 DIAGNOSIS — D689 Coagulation defect, unspecified: Secondary | ICD-10-CM | POA: Diagnosis not present

## 2016-07-27 DIAGNOSIS — Z7901 Long term (current) use of anticoagulants: Secondary | ICD-10-CM | POA: Diagnosis not present

## 2016-07-27 DIAGNOSIS — N186 End stage renal disease: Secondary | ICD-10-CM | POA: Diagnosis not present

## 2016-07-27 DIAGNOSIS — D631 Anemia in chronic kidney disease: Secondary | ICD-10-CM | POA: Diagnosis not present

## 2016-07-27 DIAGNOSIS — T8249XA Other complication of vascular dialysis catheter, initial encounter: Secondary | ICD-10-CM | POA: Diagnosis not present

## 2016-07-28 DIAGNOSIS — D509 Iron deficiency anemia, unspecified: Secondary | ICD-10-CM | POA: Diagnosis not present

## 2016-07-28 DIAGNOSIS — E1129 Type 2 diabetes mellitus with other diabetic kidney complication: Secondary | ICD-10-CM | POA: Diagnosis not present

## 2016-07-28 DIAGNOSIS — D631 Anemia in chronic kidney disease: Secondary | ICD-10-CM | POA: Diagnosis not present

## 2016-07-28 DIAGNOSIS — N186 End stage renal disease: Secondary | ICD-10-CM | POA: Diagnosis not present

## 2016-07-28 DIAGNOSIS — D689 Coagulation defect, unspecified: Secondary | ICD-10-CM | POA: Diagnosis not present

## 2016-07-28 DIAGNOSIS — Z7901 Long term (current) use of anticoagulants: Secondary | ICD-10-CM | POA: Diagnosis not present

## 2016-07-28 DIAGNOSIS — T8249XA Other complication of vascular dialysis catheter, initial encounter: Secondary | ICD-10-CM | POA: Diagnosis not present

## 2016-07-29 DIAGNOSIS — Z7901 Long term (current) use of anticoagulants: Secondary | ICD-10-CM | POA: Diagnosis not present

## 2016-07-29 DIAGNOSIS — E1129 Type 2 diabetes mellitus with other diabetic kidney complication: Secondary | ICD-10-CM | POA: Diagnosis not present

## 2016-07-29 DIAGNOSIS — D509 Iron deficiency anemia, unspecified: Secondary | ICD-10-CM | POA: Diagnosis not present

## 2016-07-29 DIAGNOSIS — N186 End stage renal disease: Secondary | ICD-10-CM | POA: Diagnosis not present

## 2016-07-29 DIAGNOSIS — D689 Coagulation defect, unspecified: Secondary | ICD-10-CM | POA: Diagnosis not present

## 2016-07-29 DIAGNOSIS — T8249XA Other complication of vascular dialysis catheter, initial encounter: Secondary | ICD-10-CM | POA: Diagnosis not present

## 2016-07-29 DIAGNOSIS — D631 Anemia in chronic kidney disease: Secondary | ICD-10-CM | POA: Diagnosis not present

## 2016-07-30 DIAGNOSIS — Z452 Encounter for adjustment and management of vascular access device: Secondary | ICD-10-CM | POA: Diagnosis not present

## 2016-07-30 MED FILL — CONTOUR NEXT STRIPS: 33 days supply | Qty: 100 | Fill #4

## 2016-08-01 DIAGNOSIS — Z7901 Long term (current) use of anticoagulants: Secondary | ICD-10-CM | POA: Diagnosis not present

## 2016-08-01 DIAGNOSIS — T8249XA Other complication of vascular dialysis catheter, initial encounter: Secondary | ICD-10-CM | POA: Diagnosis not present

## 2016-08-01 DIAGNOSIS — E1129 Type 2 diabetes mellitus with other diabetic kidney complication: Secondary | ICD-10-CM | POA: Diagnosis not present

## 2016-08-01 DIAGNOSIS — D509 Iron deficiency anemia, unspecified: Secondary | ICD-10-CM | POA: Diagnosis not present

## 2016-08-01 DIAGNOSIS — D631 Anemia in chronic kidney disease: Secondary | ICD-10-CM | POA: Diagnosis not present

## 2016-08-01 DIAGNOSIS — N186 End stage renal disease: Secondary | ICD-10-CM | POA: Diagnosis not present

## 2016-08-01 DIAGNOSIS — D689 Coagulation defect, unspecified: Secondary | ICD-10-CM | POA: Diagnosis not present

## 2016-08-02 NOTE — Progress Notes (Signed)
Personally  participated in and made any corrections needed to history, physical, neuro exam,assessment and plan as stated above.  I have personally obtained the history, evaluated lab date, reviewed imaging studies and agree with radiology interpretations.    [name], MD  

## 2016-08-04 ENCOUNTER — Ambulatory Visit: Payer: 59 | Admitting: *Deleted

## 2016-08-04 DIAGNOSIS — E1129 Type 2 diabetes mellitus with other diabetic kidney complication: Secondary | ICD-10-CM | POA: Diagnosis not present

## 2016-08-04 DIAGNOSIS — R41841 Cognitive communication deficit: Secondary | ICD-10-CM | POA: Diagnosis not present

## 2016-08-04 DIAGNOSIS — M6281 Muscle weakness (generalized): Secondary | ICD-10-CM | POA: Diagnosis not present

## 2016-08-04 DIAGNOSIS — N186 End stage renal disease: Secondary | ICD-10-CM | POA: Diagnosis not present

## 2016-08-04 DIAGNOSIS — D689 Coagulation defect, unspecified: Secondary | ICD-10-CM | POA: Diagnosis not present

## 2016-08-04 DIAGNOSIS — D509 Iron deficiency anemia, unspecified: Secondary | ICD-10-CM | POA: Diagnosis not present

## 2016-08-04 DIAGNOSIS — R471 Dysarthria and anarthria: Secondary | ICD-10-CM | POA: Diagnosis not present

## 2016-08-04 DIAGNOSIS — I69315 Cognitive social or emotional deficit following cerebral infarction: Secondary | ICD-10-CM | POA: Diagnosis not present

## 2016-08-04 DIAGNOSIS — R278 Other lack of coordination: Secondary | ICD-10-CM | POA: Diagnosis not present

## 2016-08-04 DIAGNOSIS — R482 Apraxia: Secondary | ICD-10-CM | POA: Diagnosis not present

## 2016-08-04 DIAGNOSIS — R2681 Unsteadiness on feet: Secondary | ICD-10-CM | POA: Diagnosis not present

## 2016-08-04 DIAGNOSIS — I69351 Hemiplegia and hemiparesis following cerebral infarction affecting right dominant side: Secondary | ICD-10-CM | POA: Diagnosis not present

## 2016-08-04 MED FILL — PANTOPRAZOLE SOD DR 40 MG T: 40 | 90 days supply | Qty: 90 | Fill #1

## 2016-08-04 MED FILL — CALCIUM ACETATE 667 MG CAP: 667 | 30 days supply | Qty: 180 | Fill #3

## 2016-08-04 MED FILL — LANTUS SOLOSTAR 100 UNITS/M: 100 | 60 days supply | Qty: 15 | Fill #3

## 2016-08-04 NOTE — Therapy (Signed)
[facility] [address] [place], [place], [address] Phone: [phone]   Fax:  [phone]  Speech Language Pathology Treatment  Patient Details  Name: Isaiah [name] MRN: [id number] Date of Birth: 20-Aug-1966 Referring Provider: Alysia Penna, MD  Encounter Date: 08/04/2016      End of Session - 08/04/16 1221    Visit Number 20   Number of Visits 23   Date for SLP Re-Evaluation 09/05/16   Authorization Type renewed 06-27-16, for week of 06-30-16   SLP Start Time 0850   SLP Stop Time  0930   SLP Time Calculation (min) 40 min   Activity Tolerance Patient tolerated treatment well      Past Medical History:  Diagnosis Date  . A-fib ([place])   . Acute renal failure ([place]) 07/2011  . Anemia   . Asthma   . Diabetes mellitus without complication ([place])   . ESRD (end stage renal disease) on dialysis ([place])   . Gout   . History of recent blood transfusion 10/27/14   2 Units PRBC's  . Hyperkalemia 07/2011  . Hypertension   . Monoclonal gammopathies   . Monoclonal gammopathy   . OSA on CPAP   . Pulmonary embolism ([place]) 07/2011   a. Tx with Coumadin for 6 months (unknown cause per patient).  . Pulmonary embolism ([place]) 07/2011; 09/27/2014   a. Bilat PE 07/2011 - unclear cause, tx with 6 months Coumadin.;   . Seizures ([place])   . Sickle cell-thalassemia disease ([place])    a. Sickle cell trait.  . Sleep apnea    a. uses CPAP.  [name] Stroke [name])     Past Surgical History:  Procedure Laterality Date  . AV FISTULA PLACEMENT Right 12/05/2015   Procedure: INSERTION OF ARTERIOVENOUS (AV) GORE-TEX GRAFT ARM;  Surgeon: Elam Dutch, MD;  Location: [facility];  Service: Vascular;  Laterality: Right;  . BONE MARROW BIOPSY    . CHOLECYSTECTOMY    . CHOLECYSTECTOMY  1990's?  . EYE SURGERY Right   . INSERTION OF DIALYSIS CATHETER Right 10/16/2015   Procedure: INSERTION OF PALINDROME DIALYSIS CATHETER ;  Surgeon: Elam Dutch, MD;   Location: [place];  Service: Vascular;  Laterality: Right;  . OTHER SURGICAL HISTORY     Retinal surgery  . PARS PLANA VITRECTOMY  02/17/2012   Procedure: PARS PLANA VITRECTOMY WITH 25 GAUGE;  Surgeon: Hayden Pedro, MD;  Location: [place] [facility];  Service: Ophthalmology;  Laterality: Right;  . PERIPHERAL VASCULAR CATHETERIZATION N/A 03/20/2016   Procedure: A/V Shuntogram/Fistulagram;  Surgeon: Conrad Celebration, MD;  Location: [place] CV LAB;  Service: Cardiovascular;  Laterality: N/A;    There were no vitals filed for this visit.      Subjective Assessment - 08/04/16 0905    Subjective "I'm good."   Pain Score 0-No pain               ADULT SLP TREATMENT - 08/04/16 0906      General Information   Behavior/Cognition Alert;Cooperative;Pleasant mood     Treatment Provided   Treatment provided Cognitive-Linquistic     Pain Assessment   Pain Assessment No/denies pain     Cognitive-Linquistic Treatment   Treatment focused on Cognition   Skilled Treatment Focus on use of compensatory strategies during daily life and participation in activities to facilitate further cognitive/speech recovery.      Assessment / Recommendations / Plan   Plan Continue with current plan of care;Discharge SLP treatment due  to (comment)  pt ready for break from therapy. Feels equipped to manage at            [place] - 08/04/16 [place] #1   Title pt will demo HEP for apraxia/motor plannning with independence over 3 sessions   Baseline began new POC 02-21-16   Time 1   Period --   visits, for all STGs   Status Not Met     SLP SHORT TERM GOAL #2   Title pt will demo HEP for dysarthria with independence over 3 sessions   Baseline 03/27/16, 04/03/16, 04-07-16   Time 1   Period --  visits   Status Achieved     SLP SHORT TERM GOAL #3   Title pt will demo speech 95% accurate in sentence-length responses with modified independence (compensations) over 3 sessions    Status Partially Met  one of three sessions     SLP SHORT TERM GOAL #4   Title pt will provide 4 memory strategies to SLP   Status Deferred  due to work with intelligibility and simple problem solviing     SLP SHORT TERM GOAL #5   Title pt will demo simple math, time, money tasks with 80% success   Status Not Met          SLP Long Term Goals - 08/04/16 1224      SLP LONG TERM GOAL #1   Title pt will demo functional speech in 20 minutes mod complex/complex conversation with modified independnence   Status Achieved     SLP LONG TERM GOAL #2   Title pt will demo HEP for speech-motor planning indpendently over 3 total sessions    Status Deferred  due to pt's desired work with cognition     SLP LONG TERM GOAL #3   Title pt will demo HEP for dysarthria independently over 5 total sessions   Status Deferred  due to pt desired focus on cognition     SLP LONG TERM GOAL #4   Title pt will demo functional organizational/sequencing ability for simple cognitive communication tasks   Time 3   Period Weeks  (or 23 total visits, for all LTGs)   Status On-going     SLP LONG TERM GOAL #5   Title pt will demo evidence of memory system initiated   Time 3   Period --  visits   Status On-going     SLP LONG TERM GOAL #6   Title pt will demo functional selective attention for 5 minute cognitive-linguistic task   Time 3   Period Weeks   Status On-going          Plan - 08/04/16 1222    Clinical Impression Statement Pt acknowledges that he continues to have speech and cognitive challenges, but feels equipped to target areas in functional activities in the home environment as he needs to have a break from therapy.    Consulted and Agree with Plan of Care Patient    SPEECH THERAPY DISCHARGE SUMMARY  Visits from Start of Care: 20  Current functional level related to goals / functional outcomes: Pt is mod I for communication with occasional errors which generally do not detract from  content. Min A for high level cognitive activities requiring reasoning and organization.   Remaining deficits: High level cognitive limitations and mild speech deficits   Education / Equipment: Education completed re: home exercises/activities and precautions Plan: Patient agrees  to discharge.  Patient goals were partially met. Patient is being discharged due to the patient's request.  ?????      Patient will benefit from skilled therapeutic intervention in order to improve the following deficits and impairments:   Cognitive communication deficit  Dysarthria and anarthria    Problem List Patient Active Problem List   Diagnosis Date Noted  . Hemiparesis affecting right side as late effect of cerebrovascular accident ([place]) 02/28/2016  . Aphasia, late effect of cerebrovascular disease 02/28/2016  . Acute encephalopathy 01/08/2016  . Seizures ([place]) 01/08/2016  . History of ischemic left MCA stroke 01/08/2016  . Right sided weakness 01/08/2016  . Chronic combined systolic and diastolic CHF (congestive heart failure) ([place]) 01/08/2016  . Atrial fibrillation ([place]) 01/08/2016  . Leukocytosis 01/08/2016  . Benign essential HTN 01/08/2016  . Anemia of chronic disease 01/08/2016  . Controlled type 2 diabetes mellitus with diabetic nephropathy, without long-term current use of insulin ([place]) 01/08/2016  . History of DVT (deep vein thrombosis) 01/08/2016  . ESRD (end stage renal disease) on dialysis [name])     Vinetta Bergamo MA, [place] 08/04/2016, 12:25 PM  [facility] [address] [place], [place], [address] Phone: [phone]   Fax:  [phone]   Name: [name] MRN: [id number] Date of Birth: 1965-09-19

## 2016-08-06 DIAGNOSIS — D689 Coagulation defect, unspecified: Secondary | ICD-10-CM | POA: Diagnosis not present

## 2016-08-06 DIAGNOSIS — N186 End stage renal disease: Secondary | ICD-10-CM | POA: Diagnosis not present

## 2016-08-06 DIAGNOSIS — D509 Iron deficiency anemia, unspecified: Secondary | ICD-10-CM | POA: Diagnosis not present

## 2016-08-06 DIAGNOSIS — E1129 Type 2 diabetes mellitus with other diabetic kidney complication: Secondary | ICD-10-CM | POA: Diagnosis not present

## 2016-08-07 ENCOUNTER — Other Ambulatory Visit: Payer: Self-pay | Admitting: Nurse Practitioner

## 2016-08-07 DIAGNOSIS — N186 End stage renal disease: Secondary | ICD-10-CM | POA: Diagnosis not present

## 2016-08-07 DIAGNOSIS — Z992 Dependence on renal dialysis: Secondary | ICD-10-CM | POA: Diagnosis not present

## 2016-08-07 DIAGNOSIS — E1122 Type 2 diabetes mellitus with diabetic chronic kidney disease: Secondary | ICD-10-CM | POA: Diagnosis not present

## 2016-08-07 MED ORDER — PHENYTOIN SODIUM EXTENDED 100 MG PO CAPS: 100.0000 mg | ORAL_CAPSULE | Freq: Three times a day (TID) | ORAL | 1 refills | Status: DC

## 2016-08-07 NOTE — Telephone Encounter (Signed)
Received fax confirmation for dilantin [phone].

## 2016-08-07 NOTE — Telephone Encounter (Signed)
May refill Dilantin for 6weeks . Previous instructions given at last visit were to taper Dilantin by 100 mg every 2 weeks until medication is discontinued . Please call the wife,

## 2016-08-07 NOTE — Telephone Encounter (Signed)
Pt's wife called says pt does not have refill for dilantin and she said he is to start a new medication, she needs clarification on how to wean him off dilantin

## 2016-08-07 NOTE — Telephone Encounter (Signed)
I spoke to wife.  He is needing dilantin refills (taking 139m po tid).  Is now taking the lamictal 572mpo bid starting today.  Gave 3 refills to cover titration of lamictal and then tapering of dilantin.

## 2016-08-08 ENCOUNTER — Encounter: Payer: Self-pay | Admitting: Speech Pathology

## 2016-08-08 DIAGNOSIS — E1129 Type 2 diabetes mellitus with other diabetic kidney complication: Secondary | ICD-10-CM | POA: Diagnosis not present

## 2016-08-08 DIAGNOSIS — N186 End stage renal disease: Secondary | ICD-10-CM | POA: Diagnosis not present

## 2016-08-08 DIAGNOSIS — D509 Iron deficiency anemia, unspecified: Secondary | ICD-10-CM | POA: Diagnosis not present

## 2016-08-08 DIAGNOSIS — D689 Coagulation defect, unspecified: Secondary | ICD-10-CM | POA: Diagnosis not present

## 2016-08-08 MED FILL — PHENYTOIN SOD EXT 100 MG CA: 100 | 30 days supply | Qty: 90 | Fill #0

## 2016-08-11 DIAGNOSIS — D509 Iron deficiency anemia, unspecified: Secondary | ICD-10-CM | POA: Diagnosis not present

## 2016-08-11 DIAGNOSIS — E1129 Type 2 diabetes mellitus with other diabetic kidney complication: Secondary | ICD-10-CM | POA: Diagnosis not present

## 2016-08-11 DIAGNOSIS — D689 Coagulation defect, unspecified: Secondary | ICD-10-CM | POA: Diagnosis not present

## 2016-08-11 DIAGNOSIS — Z111 Encounter for screening for respiratory tuberculosis: Secondary | ICD-10-CM | POA: Diagnosis not present

## 2016-08-11 DIAGNOSIS — N186 End stage renal disease: Secondary | ICD-10-CM | POA: Diagnosis not present

## 2016-08-11 DIAGNOSIS — R51 Headache: Secondary | ICD-10-CM | POA: Diagnosis not present

## 2016-08-12 DIAGNOSIS — I6789 Other cerebrovascular disease: Secondary | ICD-10-CM | POA: Diagnosis not present

## 2016-08-12 DIAGNOSIS — G40309 Generalized idiopathic epilepsy and epileptic syndromes, not intractable, without status epilepticus: Secondary | ICD-10-CM | POA: Diagnosis not present

## 2016-08-12 DIAGNOSIS — E1165 Type 2 diabetes mellitus with hyperglycemia: Secondary | ICD-10-CM | POA: Diagnosis not present

## 2016-08-12 DIAGNOSIS — Z7901 Long term (current) use of anticoagulants: Secondary | ICD-10-CM | POA: Diagnosis not present

## 2016-08-12 MED FILL — METHOCARBAMOL 500 MG TABLET: 500 | 10 days supply | Qty: 30 | Fill #0

## 2016-08-13 DIAGNOSIS — D509 Iron deficiency anemia, unspecified: Secondary | ICD-10-CM | POA: Diagnosis not present

## 2016-08-13 DIAGNOSIS — D689 Coagulation defect, unspecified: Secondary | ICD-10-CM | POA: Diagnosis not present

## 2016-08-13 DIAGNOSIS — Z111 Encounter for screening for respiratory tuberculosis: Secondary | ICD-10-CM | POA: Diagnosis not present

## 2016-08-13 DIAGNOSIS — N186 End stage renal disease: Secondary | ICD-10-CM | POA: Diagnosis not present

## 2016-08-13 DIAGNOSIS — R51 Headache: Secondary | ICD-10-CM | POA: Diagnosis not present

## 2016-08-13 DIAGNOSIS — E1129 Type 2 diabetes mellitus with other diabetic kidney complication: Secondary | ICD-10-CM | POA: Diagnosis not present

## 2016-08-15 DIAGNOSIS — R51 Headache: Secondary | ICD-10-CM | POA: Diagnosis not present

## 2016-08-15 DIAGNOSIS — E1129 Type 2 diabetes mellitus with other diabetic kidney complication: Secondary | ICD-10-CM | POA: Diagnosis not present

## 2016-08-15 DIAGNOSIS — D689 Coagulation defect, unspecified: Secondary | ICD-10-CM | POA: Diagnosis not present

## 2016-08-15 DIAGNOSIS — D509 Iron deficiency anemia, unspecified: Secondary | ICD-10-CM | POA: Diagnosis not present

## 2016-08-15 DIAGNOSIS — N186 End stage renal disease: Secondary | ICD-10-CM | POA: Diagnosis not present

## 2016-08-15 DIAGNOSIS — Z111 Encounter for screening for respiratory tuberculosis: Secondary | ICD-10-CM | POA: Diagnosis not present

## 2016-08-16 MED FILL — METOPROLOL TARTRATE 25 MG T: 25 | 90 days supply | Qty: 90 | Fill #1

## 2016-08-18 DIAGNOSIS — D689 Coagulation defect, unspecified: Secondary | ICD-10-CM | POA: Diagnosis not present

## 2016-08-18 DIAGNOSIS — E1129 Type 2 diabetes mellitus with other diabetic kidney complication: Secondary | ICD-10-CM | POA: Diagnosis not present

## 2016-08-18 DIAGNOSIS — N186 End stage renal disease: Secondary | ICD-10-CM | POA: Diagnosis not present

## 2016-08-19 DIAGNOSIS — G40309 Generalized idiopathic epilepsy and epileptic syndromes, not intractable, without status epilepticus: Secondary | ICD-10-CM | POA: Diagnosis not present

## 2016-08-19 DIAGNOSIS — I6789 Other cerebrovascular disease: Secondary | ICD-10-CM | POA: Diagnosis not present

## 2016-08-19 DIAGNOSIS — E1165 Type 2 diabetes mellitus with hyperglycemia: Secondary | ICD-10-CM | POA: Diagnosis not present

## 2016-08-19 DIAGNOSIS — Z7901 Long term (current) use of anticoagulants: Secondary | ICD-10-CM | POA: Diagnosis not present

## 2016-08-19 MED FILL — DOXYCYCLINE HYCLATE 100 MG: 100 | 8 days supply | Qty: 10 | Fill #0

## 2016-08-20 ENCOUNTER — Emergency Department (HOSPITAL_COMMUNITY)
Admission: EM | Admit: 2016-08-20 | Discharge: 2016-08-20 | Disposition: A | Discharge status: Home / Self Care | Payer: 59 | Attending: Emergency Medicine | Admitting: Emergency Medicine

## 2016-08-20 ENCOUNTER — Telehealth: Payer: Self-pay | Admitting: Nurse Practitioner

## 2016-08-20 ENCOUNTER — Encounter (HOSPITAL_COMMUNITY): Payer: Self-pay | Admitting: *Deleted

## 2016-08-20 DIAGNOSIS — Z7901 Long term (current) use of anticoagulants: Secondary | ICD-10-CM | POA: Diagnosis not present

## 2016-08-20 DIAGNOSIS — L0201 Cutaneous abscess of face: Secondary | ICD-10-CM | POA: Diagnosis not present

## 2016-08-20 DIAGNOSIS — R569 Unspecified convulsions: Secondary | ICD-10-CM

## 2016-08-20 DIAGNOSIS — Z8673 Personal history of transient ischemic attack (TIA), and cerebral infarction without residual deficits: Secondary | ICD-10-CM | POA: Diagnosis not present

## 2016-08-20 DIAGNOSIS — J45909 Unspecified asthma, uncomplicated: Secondary | ICD-10-CM | POA: Diagnosis not present

## 2016-08-20 DIAGNOSIS — Z794 Long term (current) use of insulin: Secondary | ICD-10-CM | POA: Insufficient documentation

## 2016-08-20 DIAGNOSIS — Z7982 Long term (current) use of aspirin: Secondary | ICD-10-CM | POA: Diagnosis not present

## 2016-08-20 DIAGNOSIS — E1140 Type 2 diabetes mellitus with diabetic neuropathy, unspecified: Secondary | ICD-10-CM | POA: Insufficient documentation

## 2016-08-20 DIAGNOSIS — E1122 Type 2 diabetes mellitus with diabetic chronic kidney disease: Secondary | ICD-10-CM | POA: Insufficient documentation

## 2016-08-20 DIAGNOSIS — Z992 Dependence on renal dialysis: Secondary | ICD-10-CM | POA: Diagnosis not present

## 2016-08-20 DIAGNOSIS — I132 Hypertensive heart and chronic kidney disease with heart failure and with stage 5 chronic kidney disease, or end stage renal disease: Secondary | ICD-10-CM | POA: Insufficient documentation

## 2016-08-20 DIAGNOSIS — G40909 Epilepsy, unspecified, not intractable, without status epilepticus: Secondary | ICD-10-CM | POA: Diagnosis not present

## 2016-08-20 DIAGNOSIS — I5042 Chronic combined systolic (congestive) and diastolic (congestive) heart failure: Secondary | ICD-10-CM | POA: Diagnosis not present

## 2016-08-20 DIAGNOSIS — N186 End stage renal disease: Secondary | ICD-10-CM | POA: Diagnosis not present

## 2016-08-20 DIAGNOSIS — L02811 Cutaneous abscess of head [any part, except face]: Secondary | ICD-10-CM | POA: Diagnosis not present

## 2016-08-20 LAB — COMPREHENSIVE METABOLIC PANEL
ALT: 20 U/L (ref 17–63)
AST: 28 U/L (ref 15–41)
Albumin: 3.7 g/dL (ref 3.5–5.0)
Alkaline Phosphatase: 89 U/L (ref 38–126)
Anion gap: 17 — ABNORMAL HIGH (ref 5–15)
BUN: 48 mg/dL — ABNORMAL HIGH (ref 6–20)
CO2: 22 mmol/L (ref 22–32)
Calcium: 8.3 mg/dL — ABNORMAL LOW (ref 8.9–10.3)
Chloride: 97 mmol/L — ABNORMAL LOW (ref 101–111)
Creatinine, Ser: 12.27 mg/dL — ABNORMAL HIGH (ref 0.61–1.24)
GFR calc Af Amer: 5 mL/min — ABNORMAL LOW (ref >60)
GFR calc non Af Amer: 4 mL/min — ABNORMAL LOW (ref >60)
Glucose, Bld: 112 mg/dL — ABNORMAL HIGH (ref 65–99)
Potassium: 4.5 mmol/L (ref 3.5–5.1)
Sodium: 136 mmol/L (ref 135–145)
Total Bilirubin: 0.7 mg/dL (ref 0.3–1.2)
Total Protein: 9.7 g/dL — ABNORMAL HIGH (ref 6.5–8.1)

## 2016-08-20 LAB — PHENYTOIN LEVEL, TOTAL: Phenytoin Lvl: 5.9 ug/mL — ABNORMAL LOW (ref 10.0–20.0)

## 2016-08-20 LAB — CBC
HCT: 36.8 % — ABNORMAL LOW (ref 39.0–52.0)
Hemoglobin: 13.6 g/dL (ref 13.0–17.0)
MCH: 30 pg (ref 26.0–34.0)
MCHC: 37 g/dL — ABNORMAL HIGH (ref 30.0–36.0)
MCV: 81.2 fL (ref 78.0–100.0)
Platelets: 270 10*3/uL (ref 150–400)
RBC: 4.53 MIL/uL (ref 4.22–5.81)
RDW: 18 % — ABNORMAL HIGH (ref 11.5–15.5)
WBC: 16.2 10*3/uL — ABNORMAL HIGH (ref 4.0–10.5)

## 2016-08-20 MED ORDER — PHENYTOIN 50 MG PO CHEW
100.0000 mg | CHEWABLE_TABLET | ORAL | Status: AC
  Administered 2016-08-20: 100 mg via ORAL
  Filled 2016-08-20 (×2): qty 2

## 2016-08-20 MED ORDER — LIDOCAINE HCL 2 % IJ SOLN
20.0000 mL | Freq: Once | INTRAMUSCULAR | Status: AC
  Administered 2016-08-20: 400 mg
  Filled 2016-08-20: qty 20

## 2016-08-20 NOTE — Telephone Encounter (Signed)
Wife called from ED.  Pt there now.  Was in [place] with son, went down no warning and had seizure.  Not grand mal.  Wife told son to call EMS.  Pt taken to ED.  Wife with him there.  They have done lab work.  Waiting for results.  Has dialysis MWF.  This sz was prior to dialysis. On three seizure meds and has not missed any doses.   She is asking what is causing this?  Please advise.

## 2016-08-20 NOTE — ED Triage Notes (Addendum)
Patient comes in per [facility] post seizure. Patient was at [place] and fell and had witnessed seizure for a few seconds. Not grand mal. A/ox4Hx of seizures, CVA with right sided weakness and slurred, esrd with RFA fistula, DM, HTN. Patient is MWF dialysis patient. EMS v/s 156/57, 102 HR, 166 fsbs. Denies neck or back pain or numbness/tingling or h/a. Patient states he's been compliant with all his meds.

## 2016-08-20 NOTE — ED Provider Notes (Signed)
Isaiah [name] Provider Note   CSN: [id number] Arrival date & time: 08/20/16  1223     History   Chief Complaint Chief Complaint  Patient presents with  . Seizures    HPI Isaiah [name] is a 50 y.o. male.  HPI Patient presents to the emergency department after witnessed seizure.  This resolved on its own without treatment.  He has a known history of seizure disorder after stroke.  He is followed with neurology here in town.  He is on Dilantin and Vimpat and is currently on an increasing dose of Lamictal as they plan to get him off the Dilantin.  He's been compliant with his medications.  No recent illness.  No fevers or chills.  No weakness of her arms or legs.  Denies chest pain and abdominal pain.  By the time my evaluation the patient is returned to baseline mental status.  Wife reports he is acting normally currently.  No neck pain or neck stiffness.  No recent head injury  He currently is being treated for an abscess of his right lateral forehead.  He is being treated with antibiotics but has not had incision and drainage at this time   Past Medical History:  Diagnosis Date  . A-fib ([place])   . Acute renal failure ([place]) 07/2011  . Anemia   . Asthma   . Diabetes mellitus without complication ([place])   . ESRD (end stage renal disease) on dialysis ([place])   . Gout   . History of recent blood transfusion 10/27/14   2 Units PRBC's  . Hyperkalemia 07/2011  . Hypertension   . Monoclonal gammopathies   . Monoclonal gammopathy   . OSA on CPAP   . Pulmonary embolism ([place]) 07/2011   a. Tx with Coumadin for 6 months (unknown cause per patient).  . Pulmonary embolism ([place]) 07/2011; 09/27/2014   a. Bilat PE 07/2011 - unclear cause, tx with 6 months Coumadin.;   . Seizures ([place])   . Sickle cell-thalassemia disease ([place])    a. Sickle cell trait.  . Sleep apnea    a. uses CPAP.  [name] Stroke [facility])     Patient Active Problem List   Diagnosis Date Noted  . Hemiparesis affecting  right side as late effect of cerebrovascular accident ([place]) 02/28/2016  . Aphasia, late effect of cerebrovascular disease 02/28/2016  . Acute encephalopathy 01/08/2016  . Seizures ([place]) 01/08/2016  . History of ischemic left MCA stroke 01/08/2016  . Right sided weakness 01/08/2016  . Chronic combined systolic and diastolic CHF (congestive heart failure) ([place]) 01/08/2016  . Atrial fibrillation ([place]) 01/08/2016  . Leukocytosis 01/08/2016  . Benign essential HTN 01/08/2016  . Anemia of chronic disease 01/08/2016  . Controlled type 2 diabetes mellitus with diabetic nephropathy, without long-term current use of insulin ([place]) 01/08/2016  . History of DVT (deep vein thrombosis) 01/08/2016  . ESRD (end stage renal disease) on dialysis [facility])     Past Surgical History:  Procedure Laterality Date  . AV FISTULA PLACEMENT Right 12/05/2015   Procedure: INSERTION OF ARTERIOVENOUS (AV) GORE-TEX GRAFT ARM;  Surgeon: Elam Dutch, MD;  Location: [place];  Service: Vascular;  Laterality: Right;  . BONE MARROW BIOPSY    . CHOLECYSTECTOMY    . CHOLECYSTECTOMY  1990's?  . EYE SURGERY Right   . INSERTION OF DIALYSIS CATHETER Right 10/16/2015   Procedure: INSERTION OF PALINDROME DIALYSIS CATHETER ;  Surgeon: Elam Dutch, MD;  Location: [place];  Service: Vascular;  Laterality:  Right;  [name] OTHER SURGICAL HISTORY     Retinal surgery  . PARS PLANA VITRECTOMY  02/17/2012   Procedure: PARS PLANA VITRECTOMY WITH 25 GAUGE;  Surgeon: Hayden Pedro, MD;  Location: [place];  Service: Ophthalmology;  Laterality: Right;  . PERIPHERAL VASCULAR CATHETERIZATION N/A 03/20/2016   Procedure: A/V Shuntogram/Fistulagram;  Surgeon: Conrad Kemp, MD;  Location: [place] CV LAB;  Service: Cardiovascular;  Laterality: N/A;       Home Medications    Prior to Admission medications   Medication Sig Start Date End Date Taking? Authorizing Provider  albuterol (PROAIR HFA) 108 (90 Base) MCG/ACT inhaler Inhale two puffs every  4-6 hours if needed for cough or wheeze 04/08/16   Jiles Prows, MD  aspirin EC 81 MG tablet Take 1 tablet (81 mg total) by mouth daily. Patient not taking: Reported on 07/24/2016 11/02/15   Bary Leriche, PA-C  cinacalcet (SENSIPAR) 60 MG tablet Take 60 mg by mouth every evening.  12/27/15   Historical Provider, MD  fluticasone furoate-vilanterol (BREO ELLIPTA) 100-25 MCG/INH AEPB Inhale one puff once daily to prevent cough or wheeze Patient taking differently: Inhale 1 puff into the lungs daily. Inhale one puff once daily to prevent cough or wheeze 04/08/16   Jiles Prows, MD  Insulin Glargine (LANTUS) 100 UNIT/ML Solostar Pen Inject 10 Units into the skin daily at 10 pm. Patient taking differently: Inject 20 Units into the skin daily at 10 pm.  01/10/16   [name], MD  Lacosamide (VIMPAT) 150 MG TABS Take 1 tablet (150 mg total) by mouth 2 (two) times daily. 06/19/16   Dennie Bible, NP  lamoTRIgine (LAMICTAL) 100 MG tablet 1/2 tab daily for 2 weeks then 1 tab daily for 2 weeks then 1/2 tab in the am and full tab at night 07/24/16   Dennie Bible, NP  metoprolol tartrate (LOPRESSOR) 25 MG tablet Take 0.5 tablets (12.5 mg total) by mouth 2 (two) times daily. 01/10/16   [name], MD  midodrine (PROAMATINE) 2.5 MG tablet Take 1 tablet (2.5 mg total) by mouth 2 (two) times daily with a meal. 11/02/15   Ivan Anchors Love, PA-C  multivitamin (RENA-VIT) TABS tablet Take 1 tablet by mouth at bedtime. 11/02/15   Ivan Anchors Love, PA-C  pantoprazole (PROTONIX) 40 MG tablet Take 1 tablet (40 mg total) by mouth 2 (two) times daily. 11/02/15   Bary Leriche, PA-C  phenytoin (DILANTIN) 100 MG ER capsule Take 1 capsule (100 mg total) by mouth 3 (three) times daily. 08/07/16 11/05/16  Dennie Bible, NP  warfarin (COUMADIN) 5 MG tablet Take 1.5 tablets (7.5 mg total) by mouth daily at 6 PM. Patient not taking: Reported on 07/24/2016 11/02/15   Bary Leriche, PA-C    Family History Family History   Problem Relation Age of Onset  . Hypertension Mother   . Diabetes Mother   . Stroke Father   . Sickle cell anemia Brother   . Diabetes type I Brother   . Kidney disease Brother     Social History Social History  Substance Use Topics  . Smoking status: Never Smoker  . Smokeless tobacco: Never Used  . Alcohol use No     Allergies   Codeine   Review of Systems Review of Systems  All other systems reviewed and are negative.    Physical Exam Updated Vital Signs BP 117/86   Pulse 78   Temp 97.4 F (36.3 C) (Oral)  Resp 18   Ht 5' 11"  (1.803 m)   Wt 213 lb (96.6 kg)   SpO2 94%   BMI 29.71 kg/m   Physical Exam  Constitutional: He is oriented to person, place, and time. He appears well-developed and well-nourished.  HENT:  Head: Normocephalic and atraumatic.  Eyes: EOM are normal.  Neck: Normal range of motion.  Cardiovascular: Normal rate, regular rhythm, normal heart sounds and intact distal pulses.   Pulmonary/Chest: Effort normal and breath sounds normal. No respiratory distress.  Abdominal: Soft. He exhibits no distension. There is no tenderness.  Musculoskeletal: Normal range of motion.  Neurological: He is alert and oriented to person, place, and time.  Dysarthria (baseline per wife)  Skin: Skin is warm and dry.  Psychiatric: He has a normal mood and affect. Judgment normal.  Nursing note and vitals reviewed.    ED Treatments / Results  Labs (all labs ordered are listed, but only abnormal results are displayed) Labs Reviewed  COMPREHENSIVE METABOLIC PANEL - Abnormal; Notable for the following:       Result Value   Chloride 97 (*)    Glucose, Bld 112 (*)    BUN 48 (*)    Creatinine, Ser 12.27 (*)    Calcium 8.3 (*)    Total Protein 9.7 (*)    GFR calc non Af Amer 4 (*)    GFR calc Af Amer 5 (*)    Anion gap 17 (*)    All other components within normal limits  PHENYTOIN LEVEL, TOTAL - Abnormal; Notable for the following:    Phenytoin Lvl 5.9  (*)    All other components within normal limits  CBC - Abnormal; Notable for the following:    WBC 16.2 (*)    HCT 36.8 (*)    MCHC 37.0 (*)    RDW 18.0 (*)    All other components within normal limits    EKG  EKG Interpretation None       Radiology No results found.  Procedures Procedures (including critical care time)  ++++++++++++++++++++++++++++++++++++++++++++++++++++++++++++++++++  INCISION AND DRAINAGE Performed by: Hoy Morn Consent: Verbal consent obtained. Risks and benefits: risks, benefits and alternatives were discussed Time out performed prior to procedure Type: abscess Body area: right lateral forehead Anesthesia: local infiltration Incision was made with a scalpel. Local anesthetic: lidocaine 2% with epinephrine Anesthetic total: 4 ml Complexity: complex Blunt dissection to break up loculations Drainage: purulent Drainage amount: small Packing material: none Patient tolerance: Patient tolerated the procedure well with no immediate complications.      Medications Ordered in ED Medications  phenytoin (DILANTIN) chewable tablet 100 mg (not administered)  lidocaine (XYLOCAINE) 2 % (with pres) injection 400 mg (400 mg Infiltration Given 08/20/16 1350)     Initial Impression / Assessment and Plan / ED Course  I have reviewed the triage vital signs and the nursing notes.  Pertinent labs & imaging results that were available during my care of the patient were reviewed by me and considered in my medical decision making (see chart for details).  Clinical Course     Breakthrough seizure.  Compliant with his medications.  Dilantin is slightly low.  He will receive his home afternoon dose of Dilantin at this time.  His potassium is okay.  He is scheduled for dialysis today but he will not make today's appointment.  They will try and reschedule for tomorrow.  No need for urgent dialysis at this time.  Normal neurologic exam.  Baseline dysarthria  from  his prior stroke.  No new weakness for the patient.  No indication for CT imaging of the head.  Patient returned to baseline mental status.  In regards to his abscess of his right lateral forehead a formal incision and drainage was performed with small amount of pus.  He will continue his antibiotics and return to the ER for new or worsening symptoms  Final Clinical Impressions(s) / ED Diagnoses   Final diagnoses:  Seizure ([place])  Abscess of forehead    New Prescriptions New Prescriptions   No medications on file     Jola Schmidt, MD 08/20/16 1450

## 2016-08-20 NOTE — Telephone Encounter (Signed)
Patient's wife is calling stating the patient is at [facility] ED with a seizure. She would like a call back to discuss.

## 2016-08-21 MED FILL — VIMPAT 150 MG TABLET: 150 | 30 days supply | Qty: 60 | Fill #2

## 2016-08-21 NOTE — Telephone Encounter (Signed)
Called wife left message, pt has an appt on Monday.

## 2016-08-21 NOTE — Telephone Encounter (Signed)
Pt's wife called this morning request appt for pt to see NP. Appt scheduled for 08/25/16.

## 2016-08-22 ENCOUNTER — Encounter: Payer: Self-pay | Admitting: Speech Pathology

## 2016-08-22 DIAGNOSIS — E1129 Type 2 diabetes mellitus with other diabetic kidney complication: Secondary | ICD-10-CM | POA: Diagnosis not present

## 2016-08-22 DIAGNOSIS — N186 End stage renal disease: Secondary | ICD-10-CM | POA: Diagnosis not present

## 2016-08-22 DIAGNOSIS — D689 Coagulation defect, unspecified: Secondary | ICD-10-CM | POA: Diagnosis not present

## 2016-08-24 MED FILL — BREO ELLIPTA 200-25 MCG INH: 200-25 | 30 days supply | Qty: 60 | Fill #5

## 2016-08-25 ENCOUNTER — Ambulatory Visit (INDEPENDENT_AMBULATORY_CARE_PROVIDER_SITE_OTHER): Payer: 59 | Admitting: Nurse Practitioner

## 2016-08-25 ENCOUNTER — Encounter: Payer: Self-pay | Admitting: Nurse Practitioner

## 2016-08-25 VITALS — BP 134/89 | HR 72 | Ht 71.0 in | Wt 221.0 lb

## 2016-08-25 DIAGNOSIS — D689 Coagulation defect, unspecified: Secondary | ICD-10-CM | POA: Diagnosis not present

## 2016-08-25 DIAGNOSIS — R569 Unspecified convulsions: Secondary | ICD-10-CM | POA: Diagnosis not present

## 2016-08-25 DIAGNOSIS — E1129 Type 2 diabetes mellitus with other diabetic kidney complication: Secondary | ICD-10-CM | POA: Diagnosis not present

## 2016-08-25 DIAGNOSIS — I639 Cerebral infarction, unspecified: Secondary | ICD-10-CM

## 2016-08-25 DIAGNOSIS — I10 Essential (primary) hypertension: Secondary | ICD-10-CM | POA: Diagnosis not present

## 2016-08-25 DIAGNOSIS — I69351 Hemiplegia and hemiparesis following cerebral infarction affecting right dominant side: Secondary | ICD-10-CM | POA: Diagnosis not present

## 2016-08-25 DIAGNOSIS — N186 End stage renal disease: Secondary | ICD-10-CM | POA: Diagnosis not present

## 2016-08-25 DIAGNOSIS — Z8673 Personal history of transient ischemic attack (TIA), and cerebral infarction without residual deficits: Secondary | ICD-10-CM

## 2016-08-25 DIAGNOSIS — Z992 Dependence on renal dialysis: Secondary | ICD-10-CM | POA: Diagnosis not present

## 2016-08-25 DIAGNOSIS — R531 Weakness: Secondary | ICD-10-CM | POA: Diagnosis not present

## 2016-08-25 DIAGNOSIS — D509 Iron deficiency anemia, unspecified: Secondary | ICD-10-CM | POA: Diagnosis not present

## 2016-08-25 DIAGNOSIS — Z7901 Long term (current) use of anticoagulants: Secondary | ICD-10-CM | POA: Diagnosis not present

## 2016-08-25 MED ORDER — LAMOTRIGINE 100 MG PO TABS: 100.0000 mg | ORAL_TABLET | Freq: Two times a day (BID) | ORAL | 1 refills | Status: DC

## 2016-08-25 MED FILL — CONTOUR NEXT STRIPS: 33 days supply | Qty: 100 | Fill #5

## 2016-08-25 NOTE — Patient Instructions (Addendum)
Continue Vimpat 150 twice daily Increase Lamictal to 159m twice daily Check Lamictal and Vimpat levels next week  If labs are therapeutic will begin decreasing Dilantin by 1039m every 2 weeks  Continue home exercise program Continue follow-up with GI physician, continue  aspirin .8125mFollow-up in Feb 28th  with Dr. SetLeonie Man

## 2016-08-25 NOTE — Progress Notes (Signed)
[facility]  PATIENT: Isaiah [name] DOB: [birth year]   REASON FOR VISIT: Follow-up for recent seizure  , history of stroke, right hemiparesis history of GI bleed  HISTORY FROM: Patient wife     HISTORY OF PRESENT ILLNESS HISTORY PS:Isaiah [name] is a 50 year old African-American gentleman seen today for first office follow-up visit following hospital consultation with Dr. Erlinda Hong in Oct 23, 2015. He is accompanied by his wife. He has a complicated PMH of DM type 2 with diabetic neuropathy, Sickle cell-thalassemia,  DVT and PE x 2 in 07/2011 and 1/ 2016 off coumadin due to GIB, OSA, CKD stage V, MGUS and anemia .He was recently admitted to [facility] in [place] on 09/16/15 with N/V and blood stools with acute on chronic renal failure due to acute pancreatitis. He was treated with supportive care, IV bicarb as well as multiple units PRBC and EGD with severe stage IV erosive esophagitis with gastritis.HD initiated on 01/09 due to poor recover with decrease in UOP. He developed hypoxic respiratory failure and was found to have RUL/RML PE as well as left popliteal DVT. He was treated with IV heparin. IVC filter was placed around 09/21/15 and post procedure was found to have right hand weakness. CT head on 09/26/15 reported possible subacut right MCA/PCA and left cerebellar infarcts which were not correlate to his right hand weakness. Neurology was consulted for input and EMG showed evidence of profound neuropathy. He had worsening of symptoms with increase in right sided weakness, facial droop speech difficulty on 09/27/15. MRI on 09/29/15 showedscattered right MCA and left MCA, left thalamus and b/l cerebellar infarcts. The infarcts on CT 09/26/15 likely to be acute too. On 09/30/15, he had worsening of aphasia and repeat MRI showed extension of left MCA frontal infarcts. MRA head showed left M2 superior branch occlusion. Echo with positive bubble study  and TEE positive for PFO. Hypercoagulation studies reported to be negative. IV heparin was changed to Lovenox due to recurrent embolic strokes on heparin.  He was then transferred here to [facility] rehab on 10/12/15. He was reported to have small amount of blood in stool on admission. Continued HD for CKD after admission. Due to CKD on HD, his lovenox discontinued and put back on heparin IV. His hematologist Dr. Alen Blew was consulted for anticoagulation use. He commented "As far as long-term anticoagulation, I feel be reasonable to consider aspirin alone and hope that IVC filter will prevent any future embolic strokes from occurring. An alternative strategy is to consider warfarin and follow closely for signs or symptoms of bleeding." since admission, Patient made significant improvement on his aphasia and right side weakness, and has been able to walk with assistance and speak much more fluent. His H&H  remained stable and no obvious bleeding reported. Dr. Erlinda Hong recommended starting warfarin. Patient has tolerated warfarin well without any more episodes of bleeding and hematocrit has remained stable. He is currently participating in outpatient physical, occupational and speech therapy and making good improvement. His able to states speaks short sentences now with some word hesitancy. He still has significant weakness in his right hand and grip but is able to walk with a cane. His balance is good is had no major falls. He does complain of pain and stiffness in his legs and hand particularly at night at times he has to wake up because of discomfort. He is being followed in the rehabilitation clinic but currently is not taking any medications for spasticity. His sugars  have all been under good control. Update 03/25/2016 PS: He returns for follow-up after last visit with me 3 months ago. He is accompanied today by his son. He was hospitalized on 4/312017 with a seizure. He was started on Vimpat 100 twice daily. EEG showed  generalized slowing without definite epileptiform activity. Limited MRI scan the brain was obtained on 12/3115 which showed multiple late subacute to chronic infarcts and her repeat MRI on 5/117 showed resolution and no definite new acute infarct. Patient was initially felt to be candidate for rehabilitation but after prolonged postictal phase for several days he made rapid improvement and was discharged home. Doing well. His able to walk without assistance. His 2 has mild expressive aphasia but speech is improving. Still has mild right-sided weakness. He walks with dragging his right leg. Is tolerating Vimpat well without side effects. He remains on warfarin and is tolerating it well without bleeding or bruising. He states his fasting sugars 7 well controlled. He has no new complaints. UPDATE 10/12/2017[name], 50 year old male returns for follow-up. With medical history of right and left  CVA in January 2017 end-stage renal disease previous pulmonary emboli, seizure disorder ,diabetes, hypertension previously on Coumadin. He was on vacation in [place] in September and admitted on 05/28/16 after a dialysis treatment he had a seizure event and was hospitalized he was also found to be anemic with a hemoglobin of 6.8 indicating positive stools in the emergency room he was admitted for complete workup. He received multiple blood transfusions.  He received a loading dose of Keppra in the emergency room and his Vimpat dose was increased to 150 mg twice daily. EEG performed was mildly abnormal due to the presence of mild intermittent generalized theta slowing which suggest mild encephalopathy of nonspecific etiology. He was hospital for 12 days and return to [place]. He denies further seizure events. He denies further stroke or TIA symptoms Continues  mild expressive aphasia and mild right-sided weakness. He returns for reevaluation.  UPDATE 11/16/2017[name], 50 year old male returns for follow-up he has  a history of right and left CVA in January 2017, end-stage renal disease on dialysis, seizure disorder, diabetes hypertension recent GI bleed while vacationing in [place] September 2017 after a dialysis treatment he had a seizure event and was hospitalized with hemoglobin of 6.8. His aspirin and Coumadin remain on hold. His GI doctor is Dr. Michail Sermon who he will see again in the next week or so. He is currently on Vimpat 150 twice daily. He had another seizure after dialysis on 07/14/2016 and was taken to the emergency room where he was placed on Dilantin 300 mg daily. Wife does not want him to be on Dilantin long term is wanting him to switch to a different medication. Fortunately he denies further stroke or TIA symptoms. He remains an speech and occupational therapy and he continues to have mild right hemiparesis and mild expressive aphasia. He returns to the office today for reevaluation  UPDATE 12/18/2017[name], 50 year old male returns for follow-up. He was seen in the emergency room on 08/20/16 for a seizure. He had an abscess of the right foreheadwhich  was I&Ded at that time. He remains on antibiotics. He is currently on Lamictal 50 in the morning and 100 at night along with Vimpat 150 twice daily. He is on Dilantin 300 however wife does not want him to be on medication long-term. His aspirin 0.81 has been restarted by  GI doctor,Dr. Michail Sermon. He has not had further  stroke or TIA symptoms. His therapies have concluded. He remains with a mild right hemiparesis and mild expressive aphasia. She returns for reevaluation REVIEW OF SYSTEMS: Full 14 system review of systems performed and notable only for those listed, all others are neg:  Constitutional: neg  Cardiovascular: neg Ear/Nose/Throat: neg  Skin: neg Eyes: neg Respiratory: neg Gastroitestinal: neg  Hematology/Lymphatic: neg  Endocrine: neg Musculoskeletal:neg Allergy/Immunology: neg Neurological: Seizure, speech difficulty, mild memory  loss Psychiatric: neg Sleep : neg   ALLERGIES: Allergies  Allergen Reactions  . Codeine Rash and Other (See Comments)    Unknown reaction (patient says it was more serious than just a rash, but he can't remember what happened)    HOME MEDICATIONS: Outpatient Medications Prior to Visit  Medication Sig Dispense Refill  . albuterol (PROAIR HFA) 108 (90 Base) MCG/ACT inhaler Inhale two puffs every 4-6 hours if needed for cough or wheeze 1 Inhaler 1  . aspirin EC 81 MG tablet Take 1 tablet (81 mg total) by mouth daily. 100 tablet 0  . cinacalcet (SENSIPAR) 60 MG tablet Take 60 mg by mouth every evening.     . fluticasone furoate-vilanterol (BREO ELLIPTA) 100-25 MCG/INH AEPB Inhale one puff once daily to prevent cough or wheeze (Patient taking differently: Inhale 1 puff into the lungs daily. Inhale one puff once daily to prevent cough or wheeze) 60 each 5  . Insulin Glargine (LANTUS) 100 UNIT/ML Solostar Pen Inject 10 Units into the skin daily at 10 pm. (Patient taking differently: Inject 20 Units into the skin daily at 10 pm. ) 15 mL 1  . Lacosamide (VIMPAT) 150 MG TABS Take 1 tablet (150 mg total) by mouth 2 (two) times daily. 60 tablet 5  . lamoTRIgine (LAMICTAL) 100 MG tablet 1/2 tab daily for 2 weeks then 1 tab daily for 2 weeks then 1/2 tab in the am and full tab at night 135 tablet 1  . metoprolol tartrate (LOPRESSOR) 25 MG tablet Take 0.5 tablets (12.5 mg total) by mouth 2 (two) times daily. 60 tablet 0  . midodrine (PROAMATINE) 2.5 MG tablet Take 1 tablet (2.5 mg total) by mouth 2 (two) times daily with a meal. 60 tablet 1  . multivitamin (RENA-VIT) TABS tablet Take 1 tablet by mouth at bedtime. 30 tablet 1  . pantoprazole (PROTONIX) 40 MG tablet Take 1 tablet (40 mg total) by mouth 2 (two) times daily. 60 tablet 1  . phenytoin (DILANTIN) 100 MG ER capsule Take 1 capsule (100 mg total) by mouth 3 (three) times daily. 90 capsule 1  . warfarin (COUMADIN) 5 MG tablet Take 1.5 tablets (7.5  mg total) by mouth daily at 6 PM. (Patient not taking: Reported on 08/25/2016) 45 tablet 1   No facility-administered medications prior to visit.     PAST MEDICAL HISTORY: Past Medical History:  Diagnosis Date  . A-fib ([place])   . Acute renal failure ([place]) 07/2011  . Anemia   . Asthma   . Diabetes mellitus without complication ([place])   . ESRD (end stage renal disease) on dialysis ([place])   . Gout   . History of recent blood transfusion 10/27/14   2 Units PRBC's  . Hyperkalemia 07/2011  . Hypertension   . Monoclonal gammopathies   . Monoclonal gammopathy   . OSA on CPAP   . Pulmonary embolism ([place]) 07/2011   a. Tx with Coumadin for 6 months (unknown cause per patient).  . Pulmonary embolism ([place]) 07/2011; 09/27/2014   a. Bilat PE 07/2011 -  unclear cause, tx with 6 months Coumadin.;   . Seizures ([place])   . Sickle cell-thalassemia disease ([place])    a. Sickle cell trait.  . Sleep apnea    a. uses CPAP.  [name] Stroke [facility])     PAST SURGICAL HISTORY: Past Surgical History:  Procedure Laterality Date  . AV FISTULA PLACEMENT Right 12/05/2015   Procedure: INSERTION OF ARTERIOVENOUS (AV) GORE-TEX GRAFT ARM;  Surgeon: Elam Dutch, MD;  Location: [place];  Service: Vascular;  Laterality: Right;  . BONE MARROW BIOPSY    . CHOLECYSTECTOMY    . CHOLECYSTECTOMY  1990's?  . EYE SURGERY Right   . INSERTION OF DIALYSIS CATHETER Right 10/16/2015   Procedure: INSERTION OF PALINDROME DIALYSIS CATHETER ;  Surgeon: Elam Dutch, MD;  Location: [place];  Service: Vascular;  Laterality: Right;  . OTHER SURGICAL HISTORY     Retinal surgery  . PARS PLANA VITRECTOMY  02/17/2012   Procedure: PARS PLANA VITRECTOMY WITH 25 GAUGE;  Surgeon: Hayden Pedro, MD;  Location: [place];  Service: Ophthalmology;  Laterality: Right;  . PERIPHERAL VASCULAR CATHETERIZATION N/A 03/20/2016   Procedure: A/V Shuntogram/Fistulagram;  Surgeon: Conrad Maywood Park, MD;  Location: [place] [facility];  Service: Cardiovascular;  Laterality:  N/A;    FAMILY HISTORY: Family History  Problem Relation Age of Onset  . Hypertension Mother   . Diabetes Mother   . Stroke Father   . Sickle cell anemia Brother   . Diabetes type I Brother   . Kidney disease Brother     SOCIAL HISTORY: Social History   Social History  . Marital status: Married    Spouse name: sylvia  . Number of children: 1  . Years of education: college   Occupational History  . [place] History Main Topics  . Smoking status: Never Smoker  . Smokeless tobacco: Never Used  . Alcohol use No  . Drug use: No  . Sexual activity: Yes   Other Topics Concern  . Not on file   Social History Narrative   ** Merged History Encounter **          Vitals:   08/25/16 1049  BP: 134/89  Pulse: 72  Weight: 221 lb (100.2 kg)  Height: 5' 11"  (1.803 m)   Body mass index is 30.82 kg/m. General: well developed, well nourished, seated, in no evident distress Head: head normocephalic and atraumatic.  Neck: supple with no carotid  bruits Cardiovascular: regular rate and rhythm, no murmurs Musculoskeletal: no deformity Skin:  no rash/petichiae Vascular:   fistula right wrist area for dialysis   Neurological examination  Mental Status: Awake and fully alert. Oriented to place and time. Recent and remote memory intact. Attention span, concentration and fund of knowledge Is slightly impaired. Mood and affect appropriate. Mild expressive aphasia with word finding difficulties and dysfluency. Good comprehension, naming and repetition. Cranial Nerves: Fundoscopic exam not done.  . Pupils equal, briskly reactive to light. Extraocular movements full without nystagmus. Visual fields full to confrontation. Hearing intact. Moderate right lower facial weakness. Facial sensation intact. Face, tongue, palate moves normally and symmetrically.  Motor: Spastic right hemiparesis with 4/5 right shoulder and elbow strength with mild  weakness of the right  grip and intrinsic hand muscles. 4/5 right lower extremity strength with spasticity and increased tone. Ankle dorsiflexors weak. Sensory.:   touch ,pinprick .position and vibratory sensation slightly diminished on the right compared to the left.  Coordination: Impaired on  the right side and normal on the left  Gait and Station: Arises from chair without difficulty. Spastic hemiplegic gait , no assistive device Reflexes: 1+ and symmetric. Toes downgoing.    DIAGNOSTIC DATA (LABS, IMAGING, TESTING) - I reviewed patient records, labs, notes, testing and imaging myself where available.  Lab Results  Component Value Date   WBC 16.2 (H) 08/20/2016   HGB 13.6 08/20/2016   HCT 36.8 (L) 08/20/2016   MCV 81.2 08/20/2016   PLT 270 08/20/2016      Component Value Date/Time   NA 136 08/20/2016 1344   NA 137 07/08/2016 0921   K 4.5 08/20/2016 1344   K 4.5 07/08/2016 0921   CL 97 (L) 08/20/2016 1344   CO2 22 08/20/2016 1344   CO2 24 07/08/2016 0921   GLUCOSE 112 (H) 08/20/2016 1344   GLUCOSE 81 07/08/2016 0921   BUN 48 (H) 08/20/2016 1344   BUN 38.0 (H) 07/08/2016 0921   CREATININE 12.27 (H) 08/20/2016 1344   CREATININE 10.0 (HH) 07/08/2016 0921   CALCIUM 8.3 (L) 08/20/2016 1344   CALCIUM 8.6 07/08/2016 0921   PROT 9.7 (H) 08/20/2016 1344   PROT 9.9 (H) 07/08/2016 0921   PROT 9.3 (H) 07/08/2016 0921   ALBUMIN 3.7 08/20/2016 1344   ALBUMIN 3.7 07/08/2016 0921   AST 28 08/20/2016 1344   AST 21 07/08/2016 0921   ALT 20 08/20/2016 1344   ALT 26 07/08/2016 0921   ALKPHOS 89 08/20/2016 1344   ALKPHOS 109 07/08/2016 0921   BILITOT 0.7 08/20/2016 1344   BILITOT 0.96 07/08/2016 0921   GFRNONAA 4 (L) 08/20/2016 1344   GFRAA 5 (L) 08/20/2016 1344   Lab Results  Component Value Date   CHOL 128 10/24/2015   HDL 48 10/24/2015   LDLCALC 62 10/24/2015   TRIG 88 10/24/2015   CHOLHDL 2.7 10/24/2015   Lab Results  Component Value Date   HGBA1C 4.4 (L) 01/06/2016   Lab Results    Component Value Date   UVOZDGUY40 347 10/24/2015   Lab Results  Component Value Date   TSH 0.896 01/06/2016     ASSESSMENT AND PLAN 50 year old African-American male with bilateral anterior and posterior circulation embolic infarcts in January 2017 secondary to paradoxical embolism from deep vein thrombosis, pulmonary embolism and patent foramen ovale while he was off Coumadin due to GI bleeding with prolonged hospitalization in [facility] but was transferred here for rehabilitation. Repeat admission in April 2017 for seizure. He has residual expressive aphasia and   spastic right hemiparesis . Vascular risk factors of diabetes, hypertension, sleep apnea. Repeat hospitalization in September 2017 [place] for seizure events after dialysis and GI bleed. Another seizure on 07/14/2016 after dialysis, seen in the emergency room and placed on Dilantin.Seen again in the ER on 08/20/16.  with seizure I&D of right forehead  for abscess. He remains on antibiotics The patient is a current patient of Dr. Leonie Man  who is out of the office today . This note is sent to the work in doctor.     PLAN:Continue Vimpat 150 twice daily Increase Lamictal to 154m twice daily Check Lamictal and Vimpat levels next week , mid week If labs are therapeutic will begin decreasing Dilantin by 1027m every 2 weeks  Continue home exercise program Continue follow-up with GI physician, continue  aspirin .8167mFollow-up in Feb 28th  with Dr. SetSloan LeiterNPNorth Big Horn Hospital DistrictC,Atrium Medical CenterPRN  [facility] [address]uiShenandoah JunctioneAieaC [phone]

## 2016-08-26 DIAGNOSIS — M2041 Other hammer toe(s) (acquired), right foot: Secondary | ICD-10-CM | POA: Diagnosis not present

## 2016-08-26 DIAGNOSIS — B351 Tinea unguium: Secondary | ICD-10-CM | POA: Diagnosis not present

## 2016-08-26 DIAGNOSIS — M2042 Other hammer toe(s) (acquired), left foot: Secondary | ICD-10-CM | POA: Diagnosis not present

## 2016-08-26 NOTE — Progress Notes (Signed)
I have read the note, and I agree with the clinical assessment and plan.  Azariel Banik A. Carl Bleecker, MD, PhD Certified in Neurology, Clinical Neurophysiology, Sleep Medicine, Pain Medicine and Neuroimaging  [facility] [address], [address] [place], [place] [address] [phone]  

## 2016-08-27 DIAGNOSIS — Z7901 Long term (current) use of anticoagulants: Secondary | ICD-10-CM | POA: Diagnosis not present

## 2016-08-27 DIAGNOSIS — D689 Coagulation defect, unspecified: Secondary | ICD-10-CM | POA: Diagnosis not present

## 2016-08-27 DIAGNOSIS — N186 End stage renal disease: Secondary | ICD-10-CM | POA: Diagnosis not present

## 2016-08-27 DIAGNOSIS — E1129 Type 2 diabetes mellitus with other diabetic kidney complication: Secondary | ICD-10-CM | POA: Diagnosis not present

## 2016-08-27 DIAGNOSIS — D509 Iron deficiency anemia, unspecified: Secondary | ICD-10-CM | POA: Diagnosis not present

## 2016-08-29 DIAGNOSIS — E1129 Type 2 diabetes mellitus with other diabetic kidney complication: Secondary | ICD-10-CM | POA: Diagnosis not present

## 2016-08-29 DIAGNOSIS — D689 Coagulation defect, unspecified: Secondary | ICD-10-CM | POA: Diagnosis not present

## 2016-08-29 DIAGNOSIS — Z7901 Long term (current) use of anticoagulants: Secondary | ICD-10-CM | POA: Diagnosis not present

## 2016-08-29 DIAGNOSIS — D509 Iron deficiency anemia, unspecified: Secondary | ICD-10-CM | POA: Diagnosis not present

## 2016-08-29 DIAGNOSIS — N186 End stage renal disease: Secondary | ICD-10-CM | POA: Diagnosis not present

## 2016-08-31 DIAGNOSIS — N186 End stage renal disease: Secondary | ICD-10-CM | POA: Diagnosis not present

## 2016-08-31 DIAGNOSIS — Z992 Dependence on renal dialysis: Secondary | ICD-10-CM | POA: Diagnosis not present

## 2016-09-03 DIAGNOSIS — D689 Coagulation defect, unspecified: Secondary | ICD-10-CM | POA: Diagnosis not present

## 2016-09-03 DIAGNOSIS — N186 End stage renal disease: Secondary | ICD-10-CM | POA: Diagnosis not present

## 2016-09-03 DIAGNOSIS — D509 Iron deficiency anemia, unspecified: Secondary | ICD-10-CM | POA: Diagnosis not present

## 2016-09-03 DIAGNOSIS — E1129 Type 2 diabetes mellitus with other diabetic kidney complication: Secondary | ICD-10-CM | POA: Diagnosis not present

## 2016-09-03 DIAGNOSIS — Z7901 Long term (current) use of anticoagulants: Secondary | ICD-10-CM | POA: Diagnosis not present

## 2016-09-04 ENCOUNTER — Other Ambulatory Visit (INDEPENDENT_AMBULATORY_CARE_PROVIDER_SITE_OTHER): Payer: Self-pay

## 2016-09-04 DIAGNOSIS — Z0289 Encounter for other administrative examinations: Secondary | ICD-10-CM

## 2016-09-04 DIAGNOSIS — R569 Unspecified convulsions: Secondary | ICD-10-CM

## 2016-09-04 DIAGNOSIS — I6789 Other cerebrovascular disease: Secondary | ICD-10-CM | POA: Diagnosis not present

## 2016-09-04 DIAGNOSIS — E1165 Type 2 diabetes mellitus with hyperglycemia: Secondary | ICD-10-CM | POA: Diagnosis not present

## 2016-09-04 DIAGNOSIS — G40309 Generalized idiopathic epilepsy and epileptic syndromes, not intractable, without status epilepticus: Secondary | ICD-10-CM | POA: Diagnosis not present

## 2016-09-04 DIAGNOSIS — Z7901 Long term (current) use of anticoagulants: Secondary | ICD-10-CM | POA: Diagnosis not present

## 2016-09-05 DIAGNOSIS — E1129 Type 2 diabetes mellitus with other diabetic kidney complication: Secondary | ICD-10-CM | POA: Diagnosis not present

## 2016-09-05 DIAGNOSIS — Z7901 Long term (current) use of anticoagulants: Secondary | ICD-10-CM | POA: Diagnosis not present

## 2016-09-05 DIAGNOSIS — D509 Iron deficiency anemia, unspecified: Secondary | ICD-10-CM | POA: Diagnosis not present

## 2016-09-05 DIAGNOSIS — N186 End stage renal disease: Secondary | ICD-10-CM | POA: Diagnosis not present

## 2016-09-05 DIAGNOSIS — D689 Coagulation defect, unspecified: Secondary | ICD-10-CM | POA: Diagnosis not present

## 2016-09-07 DIAGNOSIS — Z992 Dependence on renal dialysis: Secondary | ICD-10-CM | POA: Diagnosis not present

## 2016-09-07 DIAGNOSIS — E1129 Type 2 diabetes mellitus with other diabetic kidney complication: Secondary | ICD-10-CM | POA: Diagnosis not present

## 2016-09-07 DIAGNOSIS — D689 Coagulation defect, unspecified: Secondary | ICD-10-CM | POA: Diagnosis not present

## 2016-09-07 DIAGNOSIS — E1122 Type 2 diabetes mellitus with diabetic chronic kidney disease: Secondary | ICD-10-CM | POA: Diagnosis not present

## 2016-09-07 DIAGNOSIS — N186 End stage renal disease: Secondary | ICD-10-CM | POA: Diagnosis not present

## 2016-09-07 MED FILL — PHENYTOIN SOD EXT 100 MG CA: 100 | 30 days supply | Qty: 90 | Fill #1

## 2016-09-10 ENCOUNTER — Telehealth: Payer: Self-pay | Admitting: Nurse Practitioner

## 2016-09-10 DIAGNOSIS — D509 Iron deficiency anemia, unspecified: Secondary | ICD-10-CM | POA: Diagnosis not present

## 2016-09-10 DIAGNOSIS — Z7901 Long term (current) use of anticoagulants: Secondary | ICD-10-CM | POA: Diagnosis not present

## 2016-09-10 DIAGNOSIS — N186 End stage renal disease: Secondary | ICD-10-CM | POA: Diagnosis not present

## 2016-09-10 DIAGNOSIS — D689 Coagulation defect, unspecified: Secondary | ICD-10-CM | POA: Diagnosis not present

## 2016-09-10 DIAGNOSIS — E1129 Type 2 diabetes mellitus with other diabetic kidney complication: Secondary | ICD-10-CM | POA: Diagnosis not present

## 2016-09-10 LAB — LACOSAMIDE: Lacosamide: 6.8 ug/mL (ref 5.0–10.0)

## 2016-09-10 LAB — LAMOTRIGINE LEVEL: Lamotrigine Lvl: 1.9 ug/mL — ABNORMAL LOW (ref 2.0–20.0)

## 2016-09-10 NOTE — Telephone Encounter (Signed)
I spoke to pts wife.  I relayed her the message below.  Pt will start the dilatin taper tomorrow taking 131m po bid for 2 wks, (then will go to 102mpo daily for 2 wks) then on Feb 1 will stop.  Explained that will take several weeks to get used to increased dose of lamictal.  He will start the lamictal 15042mo bid tomorrow.  He is to stay well hydrated.  She verbalized understanding of instructions and made written notes of these.  She will call back with questions or concerns.

## 2016-09-10 NOTE — Telephone Encounter (Signed)
Please stay well hydrated, increase was because his level was subtherapeutic. It may take him several weeks to get used to the increased dose. Please begin decreasing his Dilantin as discussed in his last visit. I feel like he will feel much better once he is off of Dilantin  altogether

## 2016-09-10 NOTE — Telephone Encounter (Signed)
I spoke to wife.  I relayed the results of the lamical and vimpat levels.  I instructed her on the planned increase of lamictal 156m po bid.  Pt having dizziness (lightheadedness) since increase of 1083mpo bid.  Had sent message to CM/NP and will call her back.

## 2016-09-10 NOTE — Telephone Encounter (Signed)
Patient's wife is calling to get lab results and to discuss lamoTRIgine (LAMICTAL) 100 MG tablet which was increased to 1 in morning and 1 at night but since this new dosage the patient is experiencing dizziness

## 2016-09-10 NOTE — Telephone Encounter (Signed)
LMVM for wife, and LM of lab results below.  Noted that pt having dizziness since increase.

## 2016-09-10 NOTE — Telephone Encounter (Signed)
Good level of Vimpat. Lamictal level subtherapeutic. Please increase to 1-1/2 tabs of Lamictal twice daily. Let us know when you need a refill use up current RX. Please call the wife

## 2016-09-11 MED FILL — CALCIUM ACETATE 667 MG CAP: 667 | 30 days supply | Qty: 180 | Fill #4

## 2016-09-12 DIAGNOSIS — Z7901 Long term (current) use of anticoagulants: Secondary | ICD-10-CM | POA: Diagnosis not present

## 2016-09-12 DIAGNOSIS — E1129 Type 2 diabetes mellitus with other diabetic kidney complication: Secondary | ICD-10-CM | POA: Diagnosis not present

## 2016-09-12 DIAGNOSIS — N186 End stage renal disease: Secondary | ICD-10-CM | POA: Diagnosis not present

## 2016-09-12 DIAGNOSIS — D509 Iron deficiency anemia, unspecified: Secondary | ICD-10-CM | POA: Diagnosis not present

## 2016-09-12 DIAGNOSIS — D689 Coagulation defect, unspecified: Secondary | ICD-10-CM | POA: Diagnosis not present

## 2016-09-15 DIAGNOSIS — E1129 Type 2 diabetes mellitus with other diabetic kidney complication: Secondary | ICD-10-CM | POA: Diagnosis not present

## 2016-09-15 DIAGNOSIS — N2581 Secondary hyperparathyroidism of renal origin: Secondary | ICD-10-CM | POA: Diagnosis not present

## 2016-09-15 DIAGNOSIS — N186 End stage renal disease: Secondary | ICD-10-CM | POA: Diagnosis not present

## 2016-09-15 DIAGNOSIS — D509 Iron deficiency anemia, unspecified: Secondary | ICD-10-CM | POA: Diagnosis not present

## 2016-09-15 DIAGNOSIS — D689 Coagulation defect, unspecified: Secondary | ICD-10-CM | POA: Diagnosis not present

## 2016-09-16 DIAGNOSIS — E1165 Type 2 diabetes mellitus with hyperglycemia: Secondary | ICD-10-CM | POA: Diagnosis not present

## 2016-09-16 MED FILL — SENSIPAR 60 MG TABLET: 60 | 30 days supply | Qty: 30 | Fill #4

## 2016-09-17 DIAGNOSIS — E1129 Type 2 diabetes mellitus with other diabetic kidney complication: Secondary | ICD-10-CM | POA: Diagnosis not present

## 2016-09-17 DIAGNOSIS — D509 Iron deficiency anemia, unspecified: Secondary | ICD-10-CM | POA: Diagnosis not present

## 2016-09-17 DIAGNOSIS — N2581 Secondary hyperparathyroidism of renal origin: Secondary | ICD-10-CM | POA: Diagnosis not present

## 2016-09-17 DIAGNOSIS — D689 Coagulation defect, unspecified: Secondary | ICD-10-CM | POA: Diagnosis not present

## 2016-09-17 DIAGNOSIS — N186 End stage renal disease: Secondary | ICD-10-CM | POA: Diagnosis not present

## 2016-09-18 ENCOUNTER — Ambulatory Visit (HOSPITAL_BASED_OUTPATIENT_CLINIC_OR_DEPARTMENT_OTHER): Payer: 59 | Admitting: Physical Medicine & Rehabilitation

## 2016-09-18 ENCOUNTER — Encounter: Payer: 59 | Attending: Physical Medicine & Rehabilitation

## 2016-09-18 ENCOUNTER — Encounter: Payer: Self-pay | Admitting: Physical Medicine & Rehabilitation

## 2016-09-18 VITALS — BP 124/83 | HR 73 | Resp 14

## 2016-09-18 DIAGNOSIS — Z8673 Personal history of transient ischemic attack (TIA), and cerebral infarction without residual deficits: Secondary | ICD-10-CM | POA: Insufficient documentation

## 2016-09-18 DIAGNOSIS — I639 Cerebral infarction, unspecified: Secondary | ICD-10-CM | POA: Diagnosis not present

## 2016-09-18 DIAGNOSIS — Z8489 Family history of other specified conditions: Secondary | ICD-10-CM | POA: Insufficient documentation

## 2016-09-18 DIAGNOSIS — E119 Type 2 diabetes mellitus without complications: Secondary | ICD-10-CM | POA: Diagnosis not present

## 2016-09-18 DIAGNOSIS — R4701 Aphasia: Secondary | ICD-10-CM | POA: Diagnosis not present

## 2016-09-18 DIAGNOSIS — Z86711 Personal history of pulmonary embolism: Secondary | ICD-10-CM | POA: Diagnosis not present

## 2016-09-18 DIAGNOSIS — Z823 Family history of stroke: Secondary | ICD-10-CM | POA: Diagnosis not present

## 2016-09-18 DIAGNOSIS — I120 Hypertensive chronic kidney disease with stage 5 chronic kidney disease or end stage renal disease: Secondary | ICD-10-CM | POA: Insufficient documentation

## 2016-09-18 DIAGNOSIS — I69351 Hemiplegia and hemiparesis following cerebral infarction affecting right dominant side: Secondary | ICD-10-CM

## 2016-09-18 DIAGNOSIS — IMO0002 Reserved for concepts with insufficient information to code with codable children: Secondary | ICD-10-CM

## 2016-09-18 DIAGNOSIS — N186 End stage renal disease: Secondary | ICD-10-CM | POA: Diagnosis not present

## 2016-09-18 DIAGNOSIS — Z9889 Other specified postprocedural states: Secondary | ICD-10-CM | POA: Insufficient documentation

## 2016-09-18 DIAGNOSIS — Z8249 Family history of ischemic heart disease and other diseases of the circulatory system: Secondary | ICD-10-CM | POA: Insufficient documentation

## 2016-09-18 DIAGNOSIS — Z833 Family history of diabetes mellitus: Secondary | ICD-10-CM | POA: Diagnosis not present

## 2016-09-18 DIAGNOSIS — E1122 Type 2 diabetes mellitus with diabetic chronic kidney disease: Secondary | ICD-10-CM | POA: Diagnosis not present

## 2016-09-18 DIAGNOSIS — G4733 Obstructive sleep apnea (adult) (pediatric): Secondary | ICD-10-CM | POA: Insufficient documentation

## 2016-09-18 DIAGNOSIS — G8111 Spastic hemiplegia affecting right dominant side: Secondary | ICD-10-CM | POA: Diagnosis not present

## 2016-09-18 DIAGNOSIS — I4891 Unspecified atrial fibrillation: Secondary | ICD-10-CM | POA: Diagnosis not present

## 2016-09-18 DIAGNOSIS — Z9049 Acquired absence of other specified parts of digestive tract: Secondary | ICD-10-CM | POA: Insufficient documentation

## 2016-09-18 DIAGNOSIS — I69920 Aphasia following unspecified cerebrovascular disease: Secondary | ICD-10-CM | POA: Diagnosis not present

## 2016-09-18 MED FILL — BENZONATATE 100 MG CAPSULE: 100 | 10 days supply | Qty: 30 | Fill #0

## 2016-09-18 NOTE — Patient Instructions (Signed)
Please call if you'd like to pursue the Botox injection as discussed

## 2016-09-18 NOTE — Progress Notes (Signed)
Subjective:    Patient ID: [place], male    DOB: Jun 16, 1966, 51 y.o.   MRN: [id number]  HPI 51 year old male with history of left MCA infarct, onset in January 2017. He was in [place] when this occurred. He came back to [facility] for his rehabilitation. He was at the inpatient rehabilitation program at [facility] followed by outpatient rehabilitation. He completed PT and OT, but continued to require outpatient speech therapy into October and November 2017. Patient has had 2 ER visits since September for seizure disorder. His seizure medications have been adjusted by neurology. Because of this, he is unable to drive until cleared by neurology. Pain Inventory Average Pain 0 Pain Right Now 0 My pain is no pain  In the last 24 hours, has pain interfered with the following? General activity 5 Relation with others 6 Enjoyment of life 7 What TIME of day is your pain at its worst? no pain Sleep (in general) Good  Pain is worse with: no pain Pain improves with: no pain Relief from Meds: no pain  Mobility walk without assistance ability to climb steps?  yes do you drive?  no  Function disabled: date disabled .  Neuro/Psych No problems in this area  Prior Studies Any changes since last visit?  no  Physicians involved in your care Any changes since last visit?  no   Family History  Problem Relation Age of Onset  . Hypertension Mother   . Diabetes Mother   . Stroke Father   . Sickle cell anemia Brother   . Diabetes type I Brother   . Kidney disease Brother    Social History   Social History  . Marital status: Married    Spouse name: sylvia  . Number of children: 1  . Years of education: college   Occupational History  . [place] History Main Topics  . Smoking status: Never Smoker  . Smokeless tobacco: Never Used  . Alcohol use No  . Drug use: No  . Sexual activity: Yes   Other Topics Concern  . None   Social History  Narrative   ** Merged History Encounter **       Past Surgical History:  Procedure Laterality Date  . AV FISTULA PLACEMENT Right 12/05/2015   Procedure: INSERTION OF ARTERIOVENOUS (AV) GORE-TEX GRAFT ARM;  Surgeon: Elam Dutch, MD;  Location: [place];  Service: Vascular;  Laterality: Right;  . BONE MARROW BIOPSY    . CHOLECYSTECTOMY    . CHOLECYSTECTOMY  1990's?  . EYE SURGERY Right   . INSERTION OF DIALYSIS CATHETER Right 10/16/2015   Procedure: INSERTION OF PALINDROME DIALYSIS CATHETER ;  Surgeon: Elam Dutch, MD;  Location: [place];  Service: Vascular;  Laterality: Right;  . OTHER SURGICAL HISTORY     Retinal surgery  . PARS PLANA VITRECTOMY  02/17/2012   Procedure: PARS PLANA VITRECTOMY WITH 25 GAUGE;  Surgeon: Hayden Pedro, MD;  Location: [place];  Service: Ophthalmology;  Laterality: Right;  . PERIPHERAL VASCULAR CATHETERIZATION N/A 03/20/2016   Procedure: A/V Shuntogram/Fistulagram;  Surgeon: Conrad Vergennes, MD;  Location: [place] [facility];  Service: Cardiovascular;  Laterality: N/A;   Past Medical History:  Diagnosis Date  . A-fib ([place])   . Acute renal failure ([place]) 07/2011  . Anemia   . Asthma   . Diabetes mellitus without complication ([place])   . ESRD (end stage renal disease) on dialysis ([place])   . Gout   .  History of recent blood transfusion 10/27/14   2 Units PRBC's  . Hyperkalemia 07/2011  . Hypertension   . Monoclonal gammopathies   . Monoclonal gammopathy   . OSA on CPAP   . Pulmonary embolism ([place]) 07/2011   a. Tx with Coumadin for 6 months (unknown cause per patient).  . Pulmonary embolism ([place]) 07/2011; 09/27/2014   a. Bilat PE 07/2011 - unclear cause, tx with 6 months Coumadin.;   . Seizures ([place])   . Sickle cell-thalassemia disease ([place])    a. Sickle cell trait.  . Sleep apnea    a. uses CPAP.  [name] Stroke ([place])    BP 124/83   Pulse 73   Resp 14   SpO2 93%   Opioid Risk Score:   Fall Risk Score:  `1  Depression screen PHQ 2/9  Depression screen  [facility] 2/9 09/18/2016 03/14/2016 12/11/2015 11/16/2015 03/22/2015  Decreased Interest 0 0 0 0 0  Down, Depressed, Hopeless 0 0 0 0 0  PHQ - 2 Score 0 0 0 0 0  Altered sleeping - - - 0 -  Tired, decreased energy - - - 0 -  Change in appetite - - - 0 -  Feeling bad or failure about yourself  - - - 0 -  Trouble concentrating - - - 0 -  Moving slowly or fidgety/restless - - - 0 -  Suicidal thoughts - - - 0 -  PHQ-9 Score - - - 0 -  Some recent data might be hidden    Review of Systems  Constitutional: Negative.   HENT: Negative.   Eyes: Negative.   Respiratory: Negative.   Cardiovascular: Negative.   Gastrointestinal: Negative.   Endocrine: Negative.   Genitourinary: Negative.   Musculoskeletal: Negative.   Skin: Negative.   Allergic/Immunologic: Negative.   Neurological: Negative.   Hematological: Negative.   Psychiatric/Behavioral: Negative.   All other systems reviewed and are negative.      Objective:   Physical Exam  Constitutional: He is oriented to person, place, and time. He appears well-developed and well-nourished.  HENT:  Head: Normocephalic and atraumatic.  Eyes: Conjunctivae and EOM are normal. Pupils are equal, round, and reactive to light.  Neck: Normal range of motion.  Neurological: He is alert and oriented to person, place, and time. He exhibits abnormal muscle tone. Gait normal.  Difficulty with repeating. No if's, and's or but's. Articulation is okay with [facility] strength is 4 minus in the right deltoid, biceps, triceps, grip, 5/5 in left deltoid, biceps, triceps, grip, 5/5 bilateral hip flexor, knee extensor, ankle dorsal flexor. Tone is increased in the right FDP, right FDS to index finger only. [place] 3  Psychiatric: He has a normal mood and affect. His behavior is normal. Thought content normal. His speech is delayed.  Evidence of a aphasia, expressive, has difficulty  Nursing note and vitals reviewed.         Assessment & Plan:    1. Right spastic hemiparesis, primarily affecting finger flexor isolated to index on the right side. We discussed performing Botox injection using e-stim to isolate fascicles specific to the index finger in the FDP and FDS. At this point the patient does not want to pursue this. We discussed that the Botox can last only 3 months and would require reinjection. I do not think he needs additional therapy at the current time. We discussed timeline of recovery and plateau in function which occurs at this point in time  Over half  of the 25 min visit was spent counseling and coordinating care.

## 2016-09-19 ENCOUNTER — Ambulatory Visit: Payer: Self-pay | Admitting: Physical Medicine & Rehabilitation

## 2016-09-19 DIAGNOSIS — N186 End stage renal disease: Secondary | ICD-10-CM | POA: Diagnosis not present

## 2016-09-19 DIAGNOSIS — D689 Coagulation defect, unspecified: Secondary | ICD-10-CM | POA: Diagnosis not present

## 2016-09-19 DIAGNOSIS — N2581 Secondary hyperparathyroidism of renal origin: Secondary | ICD-10-CM | POA: Diagnosis not present

## 2016-09-19 DIAGNOSIS — E1129 Type 2 diabetes mellitus with other diabetic kidney complication: Secondary | ICD-10-CM | POA: Diagnosis not present

## 2016-09-19 DIAGNOSIS — D509 Iron deficiency anemia, unspecified: Secondary | ICD-10-CM | POA: Diagnosis not present

## 2016-09-22 ENCOUNTER — Ambulatory Visit: Payer: Medicare Other | Admitting: Nurse Practitioner

## 2016-09-22 DIAGNOSIS — Z7901 Long term (current) use of anticoagulants: Secondary | ICD-10-CM | POA: Diagnosis not present

## 2016-09-22 DIAGNOSIS — D509 Iron deficiency anemia, unspecified: Secondary | ICD-10-CM | POA: Diagnosis not present

## 2016-09-22 DIAGNOSIS — D689 Coagulation defect, unspecified: Secondary | ICD-10-CM | POA: Diagnosis not present

## 2016-09-22 DIAGNOSIS — N2581 Secondary hyperparathyroidism of renal origin: Secondary | ICD-10-CM | POA: Diagnosis not present

## 2016-09-22 DIAGNOSIS — E1129 Type 2 diabetes mellitus with other diabetic kidney complication: Secondary | ICD-10-CM | POA: Diagnosis not present

## 2016-09-22 DIAGNOSIS — N186 End stage renal disease: Secondary | ICD-10-CM | POA: Diagnosis not present

## 2016-09-24 DIAGNOSIS — Z7901 Long term (current) use of anticoagulants: Secondary | ICD-10-CM | POA: Diagnosis not present

## 2016-09-24 DIAGNOSIS — N2581 Secondary hyperparathyroidism of renal origin: Secondary | ICD-10-CM | POA: Diagnosis not present

## 2016-09-24 DIAGNOSIS — D509 Iron deficiency anemia, unspecified: Secondary | ICD-10-CM | POA: Diagnosis not present

## 2016-09-24 DIAGNOSIS — E1129 Type 2 diabetes mellitus with other diabetic kidney complication: Secondary | ICD-10-CM | POA: Diagnosis not present

## 2016-09-24 DIAGNOSIS — N186 End stage renal disease: Secondary | ICD-10-CM | POA: Diagnosis not present

## 2016-09-24 DIAGNOSIS — D689 Coagulation defect, unspecified: Secondary | ICD-10-CM | POA: Diagnosis not present

## 2016-09-25 ENCOUNTER — Ambulatory Visit: Payer: Medicare Other | Admitting: Nurse Practitioner

## 2016-09-26 DIAGNOSIS — D689 Coagulation defect, unspecified: Secondary | ICD-10-CM | POA: Diagnosis not present

## 2016-09-26 DIAGNOSIS — D509 Iron deficiency anemia, unspecified: Secondary | ICD-10-CM | POA: Diagnosis not present

## 2016-09-26 DIAGNOSIS — Z7901 Long term (current) use of anticoagulants: Secondary | ICD-10-CM | POA: Diagnosis not present

## 2016-09-26 DIAGNOSIS — N2581 Secondary hyperparathyroidism of renal origin: Secondary | ICD-10-CM | POA: Diagnosis not present

## 2016-09-26 DIAGNOSIS — N186 End stage renal disease: Secondary | ICD-10-CM | POA: Diagnosis not present

## 2016-09-26 DIAGNOSIS — E1129 Type 2 diabetes mellitus with other diabetic kidney complication: Secondary | ICD-10-CM | POA: Diagnosis not present

## 2016-09-29 ENCOUNTER — Other Ambulatory Visit: Payer: Self-pay | Admitting: *Deleted

## 2016-09-29 DIAGNOSIS — D72828 Other elevated white blood cell count: Secondary | ICD-10-CM | POA: Diagnosis not present

## 2016-09-29 DIAGNOSIS — N2581 Secondary hyperparathyroidism of renal origin: Secondary | ICD-10-CM | POA: Diagnosis not present

## 2016-09-29 DIAGNOSIS — D689 Coagulation defect, unspecified: Secondary | ICD-10-CM | POA: Diagnosis not present

## 2016-09-29 DIAGNOSIS — Z7901 Long term (current) use of anticoagulants: Secondary | ICD-10-CM | POA: Diagnosis not present

## 2016-09-29 DIAGNOSIS — D509 Iron deficiency anemia, unspecified: Secondary | ICD-10-CM | POA: Diagnosis not present

## 2016-09-29 DIAGNOSIS — N186 End stage renal disease: Secondary | ICD-10-CM | POA: Diagnosis not present

## 2016-09-29 DIAGNOSIS — E1129 Type 2 diabetes mellitus with other diabetic kidney complication: Secondary | ICD-10-CM | POA: Diagnosis not present

## 2016-09-29 MED ORDER — LAMOTRIGINE 150 MG PO TABS: 150.0000 mg | ORAL_TABLET | Freq: Two times a day (BID) | ORAL | 1 refills | Status: DC

## 2016-09-29 MED FILL — UNIFINE PENTIPS 8MM 31G: 31G X 8 MM | 90 days supply | Qty: 100 | Fill #2

## 2016-09-29 MED FILL — VIMPAT 150 MG TABLET: 150 | 30 days supply | Qty: 60 | Fill #3

## 2016-09-29 MED FILL — lamoTRIgine 150 MG TABS: 150 | 90 days supply | Qty: 180 | Fill #0

## 2016-09-29 MED FILL — LANTUS SOLOSTAR 100 UNITS/M: 100 | 60 days supply | Qty: 15 | Fill #0

## 2016-09-29 MED FILL — BREO ELLIPTA 200-25 MCG INH: 200-25 | 30 days supply | Qty: 60 | Fill #6

## 2016-09-29 NOTE — Telephone Encounter (Signed)
Patients wife called to say patient is out of his [place] outpatient pharmacy.

## 2016-09-29 NOTE — Telephone Encounter (Addendum)
Called pharmacy - pt and family requesting new rx for his increased dose on Lamictal 148m, BID.  With him having multiple medication changes, need to check with provider to see if Lamictal 1559m BID needs to be sent in or Lamical in a lower mg tablet (in the case of further dose adjustments).

## 2016-09-29 NOTE — Telephone Encounter (Signed)
Please in 153m BID on Lamictal Thanks

## 2016-09-29 NOTE — Telephone Encounter (Signed)
Rx sent to pharmacy   

## 2016-09-30 DIAGNOSIS — E1165 Type 2 diabetes mellitus with hyperglycemia: Secondary | ICD-10-CM | POA: Diagnosis not present

## 2016-10-01 DIAGNOSIS — Z7901 Long term (current) use of anticoagulants: Secondary | ICD-10-CM | POA: Diagnosis not present

## 2016-10-01 DIAGNOSIS — D689 Coagulation defect, unspecified: Secondary | ICD-10-CM | POA: Diagnosis not present

## 2016-10-01 DIAGNOSIS — E1129 Type 2 diabetes mellitus with other diabetic kidney complication: Secondary | ICD-10-CM | POA: Diagnosis not present

## 2016-10-01 DIAGNOSIS — D72828 Other elevated white blood cell count: Secondary | ICD-10-CM | POA: Diagnosis not present

## 2016-10-01 DIAGNOSIS — N2581 Secondary hyperparathyroidism of renal origin: Secondary | ICD-10-CM | POA: Diagnosis not present

## 2016-10-01 DIAGNOSIS — N186 End stage renal disease: Secondary | ICD-10-CM | POA: Diagnosis not present

## 2016-10-01 DIAGNOSIS — D509 Iron deficiency anemia, unspecified: Secondary | ICD-10-CM | POA: Diagnosis not present

## 2016-10-03 DIAGNOSIS — D509 Iron deficiency anemia, unspecified: Secondary | ICD-10-CM | POA: Diagnosis not present

## 2016-10-03 DIAGNOSIS — N2581 Secondary hyperparathyroidism of renal origin: Secondary | ICD-10-CM | POA: Diagnosis not present

## 2016-10-03 DIAGNOSIS — D72828 Other elevated white blood cell count: Secondary | ICD-10-CM | POA: Diagnosis not present

## 2016-10-03 DIAGNOSIS — D689 Coagulation defect, unspecified: Secondary | ICD-10-CM | POA: Diagnosis not present

## 2016-10-03 DIAGNOSIS — Z7901 Long term (current) use of anticoagulants: Secondary | ICD-10-CM | POA: Diagnosis not present

## 2016-10-03 DIAGNOSIS — N186 End stage renal disease: Secondary | ICD-10-CM | POA: Diagnosis not present

## 2016-10-03 DIAGNOSIS — E1129 Type 2 diabetes mellitus with other diabetic kidney complication: Secondary | ICD-10-CM | POA: Diagnosis not present

## 2016-10-06 ENCOUNTER — Emergency Department (HOSPITAL_COMMUNITY)
Admission: EM | Admit: 2016-10-06 | Discharge: 2016-10-06 | Disposition: A | Discharge status: Home / Self Care | Payer: 59 | Attending: Emergency Medicine | Admitting: Emergency Medicine

## 2016-10-06 ENCOUNTER — Encounter (HOSPITAL_COMMUNITY): Payer: Self-pay | Admitting: Emergency Medicine

## 2016-10-06 DIAGNOSIS — I5042 Chronic combined systolic (congestive) and diastolic (congestive) heart failure: Secondary | ICD-10-CM | POA: Insufficient documentation

## 2016-10-06 DIAGNOSIS — Z7982 Long term (current) use of aspirin: Secondary | ICD-10-CM | POA: Insufficient documentation

## 2016-10-06 DIAGNOSIS — R569 Unspecified convulsions: Secondary | ICD-10-CM

## 2016-10-06 DIAGNOSIS — N2581 Secondary hyperparathyroidism of renal origin: Secondary | ICD-10-CM | POA: Diagnosis not present

## 2016-10-06 DIAGNOSIS — D509 Iron deficiency anemia, unspecified: Secondary | ICD-10-CM | POA: Diagnosis not present

## 2016-10-06 DIAGNOSIS — N186 End stage renal disease: Secondary | ICD-10-CM | POA: Diagnosis not present

## 2016-10-06 DIAGNOSIS — I132 Hypertensive heart and chronic kidney disease with heart failure and with stage 5 chronic kidney disease, or end stage renal disease: Secondary | ICD-10-CM | POA: Diagnosis not present

## 2016-10-06 DIAGNOSIS — Z992 Dependence on renal dialysis: Secondary | ICD-10-CM | POA: Insufficient documentation

## 2016-10-06 DIAGNOSIS — G40909 Epilepsy, unspecified, not intractable, without status epilepticus: Secondary | ICD-10-CM | POA: Insufficient documentation

## 2016-10-06 DIAGNOSIS — Z794 Long term (current) use of insulin: Secondary | ICD-10-CM | POA: Insufficient documentation

## 2016-10-06 DIAGNOSIS — Z7901 Long term (current) use of anticoagulants: Secondary | ICD-10-CM | POA: Diagnosis not present

## 2016-10-06 DIAGNOSIS — D631 Anemia in chronic kidney disease: Secondary | ICD-10-CM | POA: Diagnosis not present

## 2016-10-06 DIAGNOSIS — Z8673 Personal history of transient ischemic attack (TIA), and cerebral infarction without residual deficits: Secondary | ICD-10-CM | POA: Diagnosis not present

## 2016-10-06 DIAGNOSIS — Z79899 Other long term (current) drug therapy: Secondary | ICD-10-CM | POA: Diagnosis not present

## 2016-10-06 DIAGNOSIS — E1129 Type 2 diabetes mellitus with other diabetic kidney complication: Secondary | ICD-10-CM | POA: Diagnosis not present

## 2016-10-06 DIAGNOSIS — D689 Coagulation defect, unspecified: Secondary | ICD-10-CM | POA: Diagnosis not present

## 2016-10-06 LAB — CBC WITH DIFFERENTIAL/PLATELET
Basophils Absolute: 0.1 10*3/uL (ref 0.0–0.1)
Basophils Relative: 1 %
Eosinophils Absolute: 0.2 10*3/uL (ref 0.0–0.7)
Eosinophils Relative: 2 %
HCT: 26.4 % — ABNORMAL LOW (ref 39.0–52.0)
Hemoglobin: 9.5 g/dL — ABNORMAL LOW (ref 13.0–17.0)
Lymphocytes Relative: 18 %
Lymphs Abs: 2.1 10*3/uL (ref 0.7–4.0)
MCH: 30.4 pg (ref 26.0–34.0)
MCHC: 36 g/dL (ref 30.0–36.0)
MCV: 84.6 fL (ref 78.0–100.0)
Monocytes Absolute: 1.2 10*3/uL — ABNORMAL HIGH (ref 0.1–1.0)
Monocytes Relative: 10 %
Neutro Abs: 8.1 10*3/uL — ABNORMAL HIGH (ref 1.7–7.7)
Neutrophils Relative %: 69 %
Platelets: 329 10*3/uL (ref 150–400)
RBC: 3.12 MIL/uL — ABNORMAL LOW (ref 4.22–5.81)
RDW: 17.4 % — ABNORMAL HIGH (ref 11.5–15.5)
WBC: 11.7 10*3/uL — ABNORMAL HIGH (ref 4.0–10.5)

## 2016-10-06 LAB — COMPREHENSIVE METABOLIC PANEL
ALT: 22 U/L (ref 17–63)
AST: 36 U/L (ref 15–41)
Albumin: 3.7 g/dL (ref 3.5–5.0)
Alkaline Phosphatase: 77 U/L (ref 38–126)
Anion gap: 17 — ABNORMAL HIGH (ref 5–15)
BUN: 62 mg/dL — ABNORMAL HIGH (ref 6–20)
CO2: 23 mmol/L (ref 22–32)
Calcium: 8.3 mg/dL — ABNORMAL LOW (ref 8.9–10.3)
Chloride: 95 mmol/L — ABNORMAL LOW (ref 101–111)
Creatinine, Ser: 13.26 mg/dL — ABNORMAL HIGH (ref 0.61–1.24)
GFR calc Af Amer: 4 mL/min — ABNORMAL LOW (ref >60)
GFR calc non Af Amer: 4 mL/min — ABNORMAL LOW (ref >60)
Glucose, Bld: 126 mg/dL — ABNORMAL HIGH (ref 65–99)
Potassium: 4.8 mmol/L (ref 3.5–5.1)
Sodium: 135 mmol/L (ref 135–145)
Total Bilirubin: 0.5 mg/dL (ref 0.3–1.2)
Total Protein: 9.5 g/dL — ABNORMAL HIGH (ref 6.5–8.1)

## 2016-10-06 LAB — CBG MONITORING, ED: Glucose-Capillary: 107 mg/dL — ABNORMAL HIGH (ref 65–99)

## 2016-10-06 NOTE — Telephone Encounter (Signed)
Spoke to wife.  Pt took dilantin 117m po this am 0930.  He had seizure in lobby  (at dialysis center  (had not started his dialysis yet) (not gmal) called EMS, [name]  They did draw labs (included his phenytoin level).  He is taking vimpat and lamictal 1545mpo bid.  He will discontinue phenytoin 10066mo daily on 10-09-16 as per plan.  She was letting us [place], if wanted to do anything else for him?

## 2016-10-06 NOTE — ED Notes (Signed)
Patient Alert and oriented X4. Stable and ambulatory. Patient verbalized understanding of the discharge instructions.  Patient belongings were taken by the patient.  

## 2016-10-06 NOTE — Discharge Instructions (Signed)
You have a new anemia a 4-point drop from December. It is important that you follow-up with your primary care provider tomorrow for further evaluation and treatment of your anemia. Your electrolytes were stable today, showing no emergent need for dialysis. Make sure you go to dialysis after next scheduled appointment. Please return to emergency department if you develop any new or worsening symptoms.

## 2016-10-06 NOTE — ED Notes (Signed)
Pt eating lunch

## 2016-10-06 NOTE — ED Triage Notes (Signed)
Pt in from dialysis via [facility] after 2-3 min seizure today in their lobby. Per EMS, the seizure was witnessed, pt did not hit head. CBG 144 for EMS, hx of CVA 1/17 with R sided weakness. Pt takes Lamictal, Vimpat and Dilantin, last seizure on 08/20/16. Pt did not have dialysis session today. Awake, a&ox4.

## 2016-10-06 NOTE — Telephone Encounter (Signed)
Pt's wife called says she just rec'd a call from dialysis, the  pt had a seizure while there. This is the 1st one since decreasing dilantin. She said 2/1 he would be completely off of dilantin. She is on the way to [facility] ED now.  She can be reached at [phone]

## 2016-10-06 NOTE — ED Notes (Signed)
PA and this RN at bedside for Occult blood collect.  Patient refused this exam and would like to leave.

## 2016-10-06 NOTE — ED Notes (Signed)
ED Provider at bedside. 

## 2016-10-06 NOTE — ED Provider Notes (Signed)
[facility] DEPT Provider Note   CSN: [id number] Arrival date & time: 10/06/16  1235     History   Chief Complaint Chief Complaint  Patient presents with  . Seizures    HPI Isaiah [name] is a 51 y.o. male with history of ESRD on dialysis MWF, diabetes, seizure disorder, anemia, CVA with R sided weakness who presents following seizure. Patient reports he was in the waiting room at dialysis today began seizing while sitting in the seat. Patient reports he began shaking mildly at first and then had full body convulsions. Patient reports he has been taking his Lamictal as prescribed. He denies any recent infections. Patient states his last seizure was in December. Patient denies any drug or alcohol use. Patient denies any pain, visual disturbance, numbness or tingling. He denies any chest pain, shortness of breath, abdominal pain nausea vomiting urinary symptoms.  HPI  Past Medical History:  Diagnosis Date  . A-fib ([place])   . Acute renal failure ([place]) 07/2011  . Anemia   . Asthma   . Diabetes mellitus without complication ([place])   . ESRD (end stage renal disease) on dialysis ([place])   . Gout   . History of recent blood transfusion 10/27/14   2 Units PRBC's  . Hyperkalemia 07/2011  . Hypertension   . Monoclonal gammopathies   . Monoclonal gammopathy   . OSA on CPAP   . Pulmonary embolism ([place]) 07/2011   a. Tx with Coumadin for 6 months (unknown cause per patient).  . Pulmonary embolism ([place]) 07/2011; 09/27/2014   a. Bilat PE 07/2011 - unclear cause, tx with 6 months Coumadin.;   . Seizures ([place])   . Sickle cell-thalassemia disease ([place])    a. Sickle cell trait.  . Sleep apnea    a. uses CPAP.  [name] Stroke [facility])     Patient Active Problem List   Diagnosis Date Noted  . Hemiparesis affecting right side as late effect of cerebrovascular accident ([place]) 02/28/2016  . Aphasia complicating stroke ([place]) 02/28/2016  . Acute encephalopathy 01/08/2016  . Seizures ([place]) 01/08/2016  .  History of ischemic left MCA stroke 01/08/2016  . Right sided weakness 01/08/2016  . Chronic combined systolic and diastolic CHF (congestive heart failure) ([place]) 01/08/2016  . Atrial fibrillation ([place]) 01/08/2016  . Leukocytosis 01/08/2016  . Benign essential HTN 01/08/2016  . Anemia of chronic disease 01/08/2016  . Controlled type 2 diabetes mellitus with diabetic nephropathy, without long-term current use of insulin ([place]) 01/08/2016  . History of DVT (deep vein thrombosis) 01/08/2016  . ESRD (end stage renal disease) on dialysis [name])     Past Surgical History:  Procedure Laterality Date  . AV FISTULA PLACEMENT Right 12/05/2015   Procedure: INSERTION OF ARTERIOVENOUS (AV) GORE-TEX GRAFT ARM;  Surgeon: Elam Dutch, MD;  Location: [facility];  Service: Vascular;  Laterality: Right;  . BONE MARROW BIOPSY    . CHOLECYSTECTOMY    . CHOLECYSTECTOMY  1990's?  . EYE SURGERY Right   . INSERTION OF DIALYSIS CATHETER Right 10/16/2015   Procedure: INSERTION OF PALINDROME DIALYSIS CATHETER ;  Surgeon: Elam Dutch, MD;  Location: [facility];  Service: Vascular;  Laterality: Right;  . OTHER SURGICAL HISTORY     Retinal surgery  . PARS PLANA VITRECTOMY  02/17/2012   Procedure: PARS PLANA VITRECTOMY WITH 25 GAUGE;  Surgeon: Hayden Pedro, MD;  Location: [place] [facility];  Service: Ophthalmology;  Laterality: Right;  . PERIPHERAL VASCULAR CATHETERIZATION N/A 03/20/2016   Procedure: A/V Shuntogram/Fistulagram;  Surgeon: Conrad [place] Bay, MD;  Location: [place] [facility];  Service: Cardiovascular;  Laterality: N/A;       Home Medications    Prior to Admission medications   Medication Sig Start Date End Date Taking? Authorizing Provider  albuterol (PROAIR HFA) 108 (90 Base) MCG/ACT inhaler Inhale two puffs every 4-6 hours if needed for cough or wheeze Patient not taking: Reported on 09/18/2016 04/08/16   Jiles Prows, MD  aspirin EC 81 MG tablet Take 1 tablet (81 mg total) by mouth daily. 11/02/15   Bary Leriche,  PA-C  cinacalcet (SENSIPAR) 60 MG tablet Take 60 mg by mouth every evening.  12/27/15   Historical Provider, MD  fluticasone furoate-vilanterol (BREO ELLIPTA) 100-25 MCG/INH AEPB Inhale one puff once daily to prevent cough or wheeze Patient taking differently: Inhale 1 puff into the lungs daily. Inhale one puff once daily to prevent cough or wheeze 04/08/16   Jiles Prows, MD  Insulin Glargine (LANTUS) 100 UNIT/ML Solostar Pen Inject 10 Units into the skin daily at 10 pm. Patient taking differently: Inject 20 Units into the skin daily at 10 pm.  01/10/16   [name], MD  Lacosamide (VIMPAT) 150 MG TABS Take 1 tablet (150 mg total) by mouth 2 (two) times daily. 06/19/16   Dennie Bible, NP  lamoTRIgine (LAMICTAL) 150 MG tablet Take 1 tablet (150 mg total) by mouth 2 (two) times daily. 09/29/16   Dennie Bible, NP  metoprolol tartrate (LOPRESSOR) 25 MG tablet Take 0.5 tablets (12.5 mg total) by mouth 2 (two) times daily. 01/10/16   [name], MD  midodrine (PROAMATINE) 2.5 MG tablet Take 1 tablet (2.5 mg total) by mouth 2 (two) times daily with a meal. 11/02/15   Ivan Anchors Love, PA-C  multivitamin (RENA-VIT) TABS tablet Take 1 tablet by mouth at bedtime. 11/02/15   Ivan Anchors Love, PA-C  pantoprazole (PROTONIX) 40 MG tablet Take 1 tablet (40 mg total) by mouth 2 (two) times daily. 11/02/15   Bary Leriche, PA-C  phenytoin (DILANTIN) 100 MG ER capsule Take 1 capsule (100 mg total) by mouth 3 (three) times daily. 08/07/16 11/05/16  Dennie Bible, NP    Family History Family History  Problem Relation Age of Onset  . Hypertension Mother   . Diabetes Mother   . Stroke Father   . Sickle cell anemia Brother   . Diabetes type I Brother   . Kidney disease Brother     Social History Social History  Substance Use Topics  . Smoking status: Never Smoker  . Smokeless tobacco: Never Used  . Alcohol use No     Allergies   Codeine   Review of Systems Review of Systems    Constitutional: Negative for chills and fever.  HENT: Negative for facial swelling and sore throat.   Respiratory: Negative for shortness of breath.   Cardiovascular: Negative for chest pain.  Gastrointestinal: Negative for abdominal pain, nausea and vomiting.  Genitourinary: Negative for dysuria.  Musculoskeletal: Negative for back pain.  Skin: Negative for rash and wound.  Neurological: Positive for seizures and weakness (R sided at baseline from CVA in 1/17). Negative for numbness and headaches.  Psychiatric/Behavioral: The patient is not nervous/anxious.      Physical Exam Updated Vital Signs BP 107/70   Pulse 70   Temp 98.1 F (36.7 C) (Oral)   Resp 18   Ht _0  (1.778 m)   Wt 99.8 kg   SpO2 96%  BMI 31.57 kg/m   Physical Exam  Constitutional: He appears well-developed and well-nourished. No distress.  HENT:  Head: Normocephalic and atraumatic.  Mouth/Throat: Oropharynx is clear and moist. No oropharyngeal exudate.  Eyes: Conjunctivae and EOM are normal. Pupils are equal, round, and reactive to light. Right eye exhibits no discharge. Left eye exhibits no discharge. No scleral icterus.  Neck: Normal range of motion. Neck supple. No thyromegaly present.  Cardiovascular: Normal rate, regular rhythm, normal heart sounds and intact distal pulses.  Exam reveals no gallop and no friction rub.   No murmur heard. Pulmonary/Chest: Effort normal and breath sounds normal. No stridor. No respiratory distress. He has no wheezes. He has no rales.  Abdominal: Soft. Bowel sounds are normal. He exhibits no distension. There is no tenderness. There is no rebound and no guarding.  Musculoskeletal: He exhibits no edema.  Lymphadenopathy:    He has no cervical adenopathy.  Neurological: He is alert. Coordination normal.  CN 3-12 intact; normal sensation throughout; 5/5 strength in all 4 extremities, weaker on the right side; equal bilateral grip strength; no ataxia on finger to nose   Skin: Skin is warm and dry. No rash noted. He is not diaphoretic. No pallor.  Psychiatric: He has a normal mood and affect.  Nursing note and vitals reviewed.    ED Treatments / Results  Labs (all labs ordered are listed, but only abnormal results are displayed) Labs Reviewed  COMPREHENSIVE METABOLIC PANEL - Abnormal; Notable for the following:       Result Value   Chloride 95 (*)    Glucose, Bld 126 (*)    BUN 62 (*)    Creatinine, Ser 13.26 (*)    Calcium 8.3 (*)    Total Protein 9.5 (*)    GFR calc non Af Amer 4 (*)    GFR calc Af Amer 4 (*)    Anion gap 17 (*)    All other components within normal limits  CBC WITH DIFFERENTIAL/PLATELET - Abnormal; Notable for the following:    WBC 11.7 (*)    RBC 3.12 (*)    Hemoglobin 9.5 (*)    HCT 26.4 (*)    RDW 17.4 (*)    Neutro Abs 8.1 (*)    Monocytes Absolute 1.2 (*)    All other components within normal limits  CBG MONITORING, ED - Abnormal; Notable for the following:    Glucose-Capillary 107 (*)    All other components within normal limits  OCCULT BLOOD X 1 CARD TO LAB, STOOL    EKG  EKG Interpretation  Date/Time:  Monday October 06 2016 12:45:20 EST Ventricular Rate:  76 PR Interval:    QRS Duration: 99 QT Interval:  404 QTC Calculation: 455 R Axis:   60 Text Interpretation:  Sinus rhythm Borderline prolonged PR interval PR interval similar to previous.  no evidence of ischemia No significant change since last tracing Confirmed by [facility] MD, Corene Cornea [phone]) on 10/06/2016 4:03:27 PM       Radiology No results found.  Procedures Procedures (including critical care time)  Medications Ordered in ED Medications - No data to display   Initial Impression / Assessment and Plan / ED Course  I have reviewed the triage vital signs and the nursing notes.  Pertinent labs & imaging results that were available during my care of the patient were reviewed by me and considered in my medical decision making (see chart for  details).     CBC shows hemoglobin 9.5, which  is a 4.1 decrease from 08/20/16. CMP shows chloride 95, glucose 126, BUN 62, creatinine 13.26, anion gap 17. Patient refuses rectal exam for stool card. Patient does have a history of rectal bleeding and colonoscopy, which has been resolved. No noticeable blood in stools per patient. Patient to follow up with PCP tomorrow. Return precautions discussed. Patient understands and agrees with plan. Patient vitals stable throughout ED course and without seizure in the ED. Patient discharged in satisfactory condition. Patient also evaluated by Dr. Dayna Barker who guided the patient's management and agrees with plan.  Final Clinical Impressions(s) / ED Diagnoses   Final diagnoses:  Seizure [facility])    New Prescriptions New Prescriptions   No medications on file     Frederica Kuster, Hershal Coria 10/06/16 [place], MD 10/06/16 [phone]

## 2016-10-07 NOTE — Telephone Encounter (Signed)
Reviewed labs from ER visit. Hbg has dropped from 13.6 on 08/20/16 to 9.5 at ER visit. Given hx of GI bleeding he may be bleeding from somewhere. You may want to f/u with PCP or GI physician. Please keep follow up with Dr. Leonie Man.

## 2016-10-07 NOTE — Telephone Encounter (Signed)
I spoke to pts wife.  No need at this point to come in sooner.  F/U with GI for lab values. She had already made call to them and waiting there call back.   Call us if more sz but for now no changes. See Dr. Leonie Man on the 11-05-16.  She verbalized understanding.

## 2016-10-07 NOTE — Telephone Encounter (Signed)
Patients wife called and stated that she called yesterday and never heard anything. She would like a call back. She would also like to know if her husband should come in for an earlier apt. Please call and advise.    Please call at [phone].

## 2016-10-07 NOTE — Telephone Encounter (Signed)
Spoke to wife.   Pt was discharged yesterday and had dialysis yesterday 5-830pm.  He has been ok.  I asked about phenytoin level yesterday that was done, and wife did not know when they left ED what results were, but she said she was not surprised that it was not done.   Pt has appt with Dr. Leonie Man 2-28/18 at 1100 (will keep that if no change).

## 2016-10-08 DIAGNOSIS — E1129 Type 2 diabetes mellitus with other diabetic kidney complication: Secondary | ICD-10-CM | POA: Diagnosis not present

## 2016-10-08 DIAGNOSIS — D509 Iron deficiency anemia, unspecified: Secondary | ICD-10-CM | POA: Diagnosis not present

## 2016-10-08 DIAGNOSIS — N186 End stage renal disease: Secondary | ICD-10-CM | POA: Diagnosis not present

## 2016-10-08 DIAGNOSIS — E1122 Type 2 diabetes mellitus with diabetic chronic kidney disease: Secondary | ICD-10-CM | POA: Diagnosis not present

## 2016-10-08 DIAGNOSIS — D689 Coagulation defect, unspecified: Secondary | ICD-10-CM | POA: Diagnosis not present

## 2016-10-08 DIAGNOSIS — N2581 Secondary hyperparathyroidism of renal origin: Secondary | ICD-10-CM | POA: Diagnosis not present

## 2016-10-08 DIAGNOSIS — Z992 Dependence on renal dialysis: Secondary | ICD-10-CM | POA: Diagnosis not present

## 2016-10-08 DIAGNOSIS — Z7901 Long term (current) use of anticoagulants: Secondary | ICD-10-CM | POA: Diagnosis not present

## 2016-10-08 DIAGNOSIS — D631 Anemia in chronic kidney disease: Secondary | ICD-10-CM | POA: Diagnosis not present

## 2016-10-09 ENCOUNTER — Encounter: Payer: Self-pay | Admitting: Pharmacist

## 2016-10-09 ENCOUNTER — Other Ambulatory Visit: Payer: Self-pay | Admitting: Pharmacist

## 2016-10-09 NOTE — Patient Outreach (Signed)
[facility]) Care Management  King   10/09/2016  [place] Aug 08, 1966 [id number]  Subjective: [facility] pharmacy home visit today for diabetes follow-up as part of the employer-sponsored [employer] to Wellness program.  His past medical history is significant for CHF (LVEF 40-45%), hypertension, atrial fibrillation, type 2 diabetes, ESRD (on HD on Monday, Wednesday, Friday), Seizures, and Stroke. Current diabetes regimen includes Lantus 12 units at 10 pm.  Patient also continues on daily aspirin but no statin.  Most recent MD follow-up was last week with Dr Jeanann Lewandowsky.  Patient reported to emergency department on 10/06/16 with seizure.  He has followup with neurology on 11/05/16. He states he is status-post a stroke (09/2015) and seizure (01/06/16) and is currently going to rehabilitation. He continues to have limited mobility in his right hand.  Patient reported dietary habits: Eats 3 meals/day.  States these are low carbohydrate most of the time.  States he does eat peanut butter crackers at night.   Patient reported exercise habits: home exercises 2 days per week for around 60 minutes intermittently  Patient reports hypoglycemic events occasionally around once per month.   Patient is not making any urine and is on hemodialysis.  Patient denies neuropathy. Patient denies visual changes. Last Eye Exam 08/2016. Reports next eye exam scheduled for 11/2016.  Does report some vision changes since his stroke.   Last Dental Exam 07/02/2016.   Patient reports self foot exams. Denies changes.    Does report some dizziness which he believes is caused by his phenytoin which he stopped today after a titration off the phenytoin.    Patient reported self monitored blood glucose frequency 3-4 times per day.   Home fasting CBG (30 day average): 141 mg/dL  Before meal CBG (30 day average) 110 mg/dL  Post meal CBG (30 day average) 131 mg/dL    Patient recently had 14 day continuous  glucose monitor in August which showed estimated A1C of 5.3% with nocturnal hypoglycemia.    Objective:  Lipid Panel     Component Value Date/Time   CHOL 128 10/24/2015 1210   TRIG 88 10/24/2015 1210   HDL 48 10/24/2015 1210   CHOLHDL 2.7 10/24/2015 1210   VLDL 18 10/24/2015 1210   LDLCALC 62 10/24/2015 1210     Encounter Medications: Outpatient Encounter Prescriptions as of 10/09/2016  Medication Sig Note  . albuterol (PROAIR HFA) 108 (90 Base) MCG/ACT inhaler Inhale two puffs every 4-6 hours if needed for cough or wheeze   . aspirin EC 81 MG tablet Take 1 tablet (81 mg total) by mouth daily.   . cinacalcet (SENSIPAR) 60 MG tablet Take 60 mg by mouth every evening.    . fluticasone furoate-vilanterol (BREO ELLIPTA) 100-25 MCG/INH AEPB Inhale one puff once daily to prevent cough or wheeze   . insulin glargine (LANTUS) 100 unit/mL SOPN Inject 12 Units into the skin at bedtime.   . Lacosamide (VIMPAT) 150 MG TABS Take 1 tablet (150 mg total) by mouth 2 (two) times daily.   [name] lamoTRIgine (LAMICTAL) 150 MG tablet Take 1 tablet (150 mg total) by mouth 2 (two) times daily.   . metoprolol tartrate (LOPRESSOR) 25 MG tablet Take 0.5 tablets (12.5 mg total) by mouth 2 (two) times daily.   . midodrine (PROAMATINE) 2.5 MG tablet Take 1 tablet (2.5 mg total) by mouth 2 (two) times daily with a meal.   . multivitamin (RENA-VIT) TABS tablet Take 1 tablet by mouth at bedtime.   [name]  pantoprazole (PROTONIX) 40 MG tablet Take 1 tablet (40 mg total) by mouth 2 (two) times daily.   . phenytoin (DILANTIN) 100 MG ER capsule Take 1 capsule (100 mg total) by mouth 3 (three) times daily. (Patient not taking: Reported on 10/09/2016) 10/09/2016: Stopped 10/09/2016  . [DISCONTINUED] Insulin Glargine (LANTUS) 100 UNIT/ML Solostar Pen Inject 10 Units into the skin daily at 10 pm. (Patient taking differently: Inject 20 Units into the skin daily at 10 pm. ) 09/18/2016: 16 units q hs   No facility-administered encounter  medications on file as of 10/09/2016.     Functional Status: In your present state of health, do you have any difficulty performing the following activities: 10/09/2016 03/20/2016  Hearing? N N  Vision? N N  Difficulty concentrating or making decisions? N N  Walking or climbing stairs? N N  Dressing or bathing? N N  Doing errands, shopping? N -  Preparing Food and eating ? N -  Using the Toilet? N -  In the past six months, have you accidently leaked urine? N -  Do you have problems with loss of bowel control? N -  Managing your Medications? N -  Managing your Finances? N -  Housekeeping or managing your Housekeeping? N -  Some recent data might be hidden    Fall/Depression Screening: PHQ 2/9 Scores 10/09/2016 09/18/2016 03/14/2016 12/11/2015 11/16/2015 03/22/2015  PHQ - 2 Score 0 0 0 0 0 0  PHQ- 9 Score - - - - 0 -     Assessment:  Diabetes: Most recent A1C at goal of less than 7%. Patient has a history of hypoglycemia and has been managed by his primary care with a 14 day continuous glucose monitor.     Plan/Goals for Next Visit: Transition to new employee sponsored [place] on signs/symptoms/treatment of hypoglycemia using rule of 15s Will contact Dr Carlis Abbott and patients neurologist to consider addition of statin in setting of secondary prophylaxis of stroke.   Next appointment to see me is: Patient will transition to new employee sponsored [facility] diabetes program    Bennye Alm, PharmD [facility] PGY2 Pharmacy Resident [phone]  [name] CM Care Plan Problem One   Flowsheet Row Most Recent Value  Care Plan Problem One  Hypoglycemic episodes as evidenced by patient report and 14 day continuous glucose monitor  Role Documenting the Problem One  Clinical Pharmacist  Care Plan for Problem One  Active  [facility] Long Term Goal (31-90 days)  Patient will continue to check blood glucose at least 2 times per day and will treat low blood glucose appropriately over the next 90  days  [facility] Long Term Goal Start Date  10/09/16  Interventions for Problem One Long Term Goal  Counseled on signs/symptoms/treatment of hypoglycemia and discussed importance of eating protein rich foods to help prevent hypoglycemic episodes

## 2016-10-10 DIAGNOSIS — N186 End stage renal disease: Secondary | ICD-10-CM | POA: Diagnosis not present

## 2016-10-10 DIAGNOSIS — E1129 Type 2 diabetes mellitus with other diabetic kidney complication: Secondary | ICD-10-CM | POA: Diagnosis not present

## 2016-10-10 DIAGNOSIS — D689 Coagulation defect, unspecified: Secondary | ICD-10-CM | POA: Diagnosis not present

## 2016-10-10 DIAGNOSIS — N2581 Secondary hyperparathyroidism of renal origin: Secondary | ICD-10-CM | POA: Diagnosis not present

## 2016-10-13 DIAGNOSIS — D689 Coagulation defect, unspecified: Secondary | ICD-10-CM | POA: Diagnosis not present

## 2016-10-13 DIAGNOSIS — D509 Iron deficiency anemia, unspecified: Secondary | ICD-10-CM | POA: Diagnosis not present

## 2016-10-13 DIAGNOSIS — E1129 Type 2 diabetes mellitus with other diabetic kidney complication: Secondary | ICD-10-CM | POA: Diagnosis not present

## 2016-10-13 DIAGNOSIS — N2581 Secondary hyperparathyroidism of renal origin: Secondary | ICD-10-CM | POA: Diagnosis not present

## 2016-10-13 DIAGNOSIS — N186 End stage renal disease: Secondary | ICD-10-CM | POA: Diagnosis not present

## 2016-10-14 ENCOUNTER — Other Ambulatory Visit (HOSPITAL_BASED_OUTPATIENT_CLINIC_OR_DEPARTMENT_OTHER): Payer: 59

## 2016-10-14 DIAGNOSIS — D472 Monoclonal gammopathy: Secondary | ICD-10-CM | POA: Diagnosis not present

## 2016-10-14 LAB — CBC WITH DIFFERENTIAL/PLATELET
BASO%: 1.2 % (ref 0.0–2.0)
Basophils Absolute: 0.2 10*3/uL — ABNORMAL HIGH (ref 0.0–0.1)
EOS%: 2.1 % (ref 0.0–7.0)
Eosinophils Absolute: 0.3 10*3/uL (ref 0.0–0.5)
HCT: 27.7 % — ABNORMAL LOW (ref 38.4–49.9)
HGB: 9.8 g/dL — ABNORMAL LOW (ref 13.0–17.1)
LYMPH%: 15.7 % (ref 14.0–49.0)
MCH: 31.6 pg (ref 27.2–33.4)
MCHC: 35.4 g/dL (ref 32.0–36.0)
MCV: 89.4 fL (ref 79.3–98.0)
MONO#: 1.4 10*3/uL — ABNORMAL HIGH (ref 0.1–0.9)
MONO%: 11.8 % (ref 0.0–14.0)
NEUT#: 8.4 10*3/uL — ABNORMAL HIGH (ref 1.5–6.5)
NEUT%: 69.2 % (ref 39.0–75.0)
Platelets: 406 10*3/uL — ABNORMAL HIGH (ref 140–400)
RBC: 3.1 10*6/uL — ABNORMAL LOW (ref 4.20–5.82)
RDW: 18.9 % — ABNORMAL HIGH (ref 11.0–14.6)
WBC: 12.2 10*3/uL — ABNORMAL HIGH (ref 4.0–10.3)
lymph#: 1.9 10*3/uL (ref 0.9–3.3)
nRBC: 6 % — ABNORMAL HIGH (ref 0–0)

## 2016-10-14 LAB — COMPREHENSIVE METABOLIC PANEL
ALT: 12 U/L (ref 0–55)
AST: 15 U/L (ref 5–34)
Albumin: 3.8 g/dL (ref 3.5–5.0)
Alkaline Phosphatase: 96 U/L (ref 40–150)
Anion Gap: 10 mEq/L (ref 3–11)
BUN: 36.2 mg/dL — ABNORMAL HIGH (ref 7.0–26.0)
CO2: 27 mEq/L (ref 22–29)
Calcium: 9 mg/dL (ref 8.4–10.4)
Chloride: 99 mEq/L (ref 98–109)
Creatinine: 9 mg/dL (ref 0.7–1.3)
EGFR: 7 mL/min/{1.73_m2} — ABNORMAL LOW (ref >90)
Glucose: 101 mg/dl (ref 70–140)
Potassium: 3.7 mEq/L (ref 3.5–5.1)
Sodium: 136 mEq/L (ref 136–145)
Total Bilirubin: 0.93 mg/dL (ref 0.20–1.20)
Total Protein: 9.8 g/dL — ABNORMAL HIGH (ref 6.4–8.3)

## 2016-10-14 LAB — TECHNOLOGIST REVIEW

## 2016-10-15 ENCOUNTER — Other Ambulatory Visit: Payer: Self-pay | Admitting: Physical Medicine and Rehabilitation

## 2016-10-15 DIAGNOSIS — E1129 Type 2 diabetes mellitus with other diabetic kidney complication: Secondary | ICD-10-CM | POA: Diagnosis not present

## 2016-10-15 DIAGNOSIS — D689 Coagulation defect, unspecified: Secondary | ICD-10-CM | POA: Diagnosis not present

## 2016-10-15 DIAGNOSIS — D509 Iron deficiency anemia, unspecified: Secondary | ICD-10-CM | POA: Diagnosis not present

## 2016-10-15 DIAGNOSIS — N186 End stage renal disease: Secondary | ICD-10-CM | POA: Diagnosis not present

## 2016-10-15 DIAGNOSIS — N2581 Secondary hyperparathyroidism of renal origin: Secondary | ICD-10-CM | POA: Diagnosis not present

## 2016-10-15 LAB — KAPPA/LAMBDA LIGHT CHAINS
Ig Kappa Free Light Chain: 936.6 mg/L — ABNORMAL HIGH (ref 3.3–19.4)
Ig Lambda Free Light Chain: 124.4 mg/L — ABNORMAL HIGH (ref 5.7–26.3)
Kappa/Lambda FluidC Ratio: 7.53 — ABNORMAL HIGH (ref 0.26–1.65)

## 2016-10-16 LAB — MULTIPLE MYELOMA PANEL, SERUM
Albumin SerPl Elph-Mcnc: 4.1 g/dL (ref 2.9–4.4)
Albumin/Glob SerPl: 0.8 (ref 0.7–1.7)
Alpha 1: 0.3 g/dL (ref 0.0–0.4)
Alpha2 Glob SerPl Elph-Mcnc: 0.6 g/dL (ref 0.4–1.0)
B-Globulin SerPl Elph-Mcnc: 0.9 g/dL (ref 0.7–1.3)
Gamma Glob SerPl Elph-Mcnc: 3.9 g/dL — ABNORMAL HIGH (ref 0.4–1.8)
Globulin, Total: 5.6 g/dL — ABNORMAL HIGH (ref 2.2–3.9)
IgA, Qn, Serum: 93 mg/dL (ref 90–386)
IgG, Qn, Serum: 4168 mg/dL — ABNORMAL HIGH (ref 700–1600)
IgM, Qn, Serum: 31 mg/dL (ref 20–172)
M Protein SerPl Elph-Mcnc: 3.4 g/dL — ABNORMAL HIGH
Total Protein: 9.7 g/dL — ABNORMAL HIGH (ref 6.0–8.5)

## 2016-10-17 DIAGNOSIS — D509 Iron deficiency anemia, unspecified: Secondary | ICD-10-CM | POA: Diagnosis not present

## 2016-10-17 DIAGNOSIS — N186 End stage renal disease: Secondary | ICD-10-CM | POA: Diagnosis not present

## 2016-10-17 DIAGNOSIS — D689 Coagulation defect, unspecified: Secondary | ICD-10-CM | POA: Diagnosis not present

## 2016-10-17 DIAGNOSIS — E1129 Type 2 diabetes mellitus with other diabetic kidney complication: Secondary | ICD-10-CM | POA: Diagnosis not present

## 2016-10-17 DIAGNOSIS — N2581 Secondary hyperparathyroidism of renal origin: Secondary | ICD-10-CM | POA: Diagnosis not present

## 2016-10-20 DIAGNOSIS — E1129 Type 2 diabetes mellitus with other diabetic kidney complication: Secondary | ICD-10-CM | POA: Diagnosis not present

## 2016-10-20 DIAGNOSIS — D631 Anemia in chronic kidney disease: Secondary | ICD-10-CM | POA: Diagnosis not present

## 2016-10-20 DIAGNOSIS — D509 Iron deficiency anemia, unspecified: Secondary | ICD-10-CM | POA: Diagnosis not present

## 2016-10-20 DIAGNOSIS — N2581 Secondary hyperparathyroidism of renal origin: Secondary | ICD-10-CM | POA: Diagnosis not present

## 2016-10-20 DIAGNOSIS — D689 Coagulation defect, unspecified: Secondary | ICD-10-CM | POA: Diagnosis not present

## 2016-10-20 DIAGNOSIS — N186 End stage renal disease: Secondary | ICD-10-CM | POA: Diagnosis not present

## 2016-10-20 DIAGNOSIS — Z7901 Long term (current) use of anticoagulants: Secondary | ICD-10-CM | POA: Diagnosis not present

## 2016-10-21 ENCOUNTER — Ambulatory Visit (HOSPITAL_BASED_OUTPATIENT_CLINIC_OR_DEPARTMENT_OTHER): Payer: 59 | Admitting: Oncology

## 2016-10-21 ENCOUNTER — Telehealth: Payer: Self-pay | Admitting: Oncology

## 2016-10-21 ENCOUNTER — Ambulatory Visit (HOSPITAL_COMMUNITY)
Admission: RE | Admit: 2016-10-21 | Discharge: 2016-10-21 | Disposition: A | Discharge status: Home / Self Care | Payer: 59 | Source: Ambulatory Visit | Attending: Oncology | Admitting: Oncology

## 2016-10-21 VITALS — BP 124/72 | HR 77 | Temp 98.5°F | Resp 18 | Ht 70.0 in | Wt 216.9 lb

## 2016-10-21 DIAGNOSIS — D649 Anemia, unspecified: Secondary | ICD-10-CM | POA: Diagnosis not present

## 2016-10-21 DIAGNOSIS — E119 Type 2 diabetes mellitus without complications: Secondary | ICD-10-CM | POA: Diagnosis not present

## 2016-10-21 DIAGNOSIS — D472 Monoclonal gammopathy: Secondary | ICD-10-CM

## 2016-10-21 DIAGNOSIS — I10 Essential (primary) hypertension: Secondary | ICD-10-CM

## 2016-10-21 DIAGNOSIS — Z8673 Personal history of transient ischemic attack (TIA), and cerebral infarction without residual deficits: Secondary | ICD-10-CM

## 2016-10-21 DIAGNOSIS — Z86718 Personal history of other venous thrombosis and embolism: Secondary | ICD-10-CM

## 2016-10-21 DIAGNOSIS — D582 Other hemoglobinopathies: Secondary | ICD-10-CM | POA: Diagnosis not present

## 2016-10-21 DIAGNOSIS — N189 Chronic kidney disease, unspecified: Secondary | ICD-10-CM | POA: Diagnosis not present

## 2016-10-21 NOTE — Progress Notes (Signed)
Hematology and Oncology Follow Up Visit  Isaiah [name] [id number] 01/20/1966 50 y.o. 10/21/2016 10:40 AM    Principle Diagnosis: This is a 50-year-old gentleman with the following diagnoses:   1. Monoclonal protein in the form of IgG kappa, likely represents monoclonal gammopathy, undetermined significance versus a smoldering myeloma. He presented with M spike about 1 g/dL and bone marrow biopsy showed 7% plasma cell involvement without evidence of myeloma in 12/2010. He did not have any lytic bone lesions. Repeat a bone marrow biopsy in 10/16/2014 showed 13% plasma cell infiltration and no lytic bone lesions on his skeletal survey.  2. Multifactorial anemia: The etiology includes anemia of renal disease, plasma cell disorder, myeloproliferative disorder, and GI bleeding.   3. Chronic renal failure hemodialysis-dependent are not related that the plasma cell disorder.  4. He is status post CVA and right-sided weakness and speech difficulties in February 2017.  Interim History:  Isaiah [name] presents today for a followup visit with his family. Since the last visit, he reports no major changes. He continues to develop occasional seizures and was seen in emergency department in January 2018. He does not report any recent seizures or hospitalizations. He is ambulating without the help of a walker or cane and had not had any falls or syncope. His appetite has been excellent and does not report any pathological fractures or bone pain.  He remains hemodialysis-dependent with a fistula and place and currently under evaluation for possible renal transplant.  He does not report any headaches, blurry vision, syncope. He does not report any fevers, chills, sweats or weight loss. He does not report any  palpitation, orthopnea or leg edema. Does not report any cough or hemoptysis or hematemesis. He does not report any nausea, vomiting. He does not report any frequency, urgency or hesitancy. Rest of his review of  systems unremarkable.   Medications: I have reviewed the patient's current medications.  Current Outpatient Prescriptions:  .  albuterol (PROAIR HFA) 108 (90 Base) MCG/ACT inhaler, Inhale two puffs every 4-6 hours if needed for cough or wheeze, Disp: 1 Inhaler, Rfl: 1 .  aspirin EC 81 MG tablet, Take 1 tablet (81 mg total) by mouth daily., Disp: 100 tablet, Rfl: 0 .  cinacalcet (SENSIPAR) 60 MG tablet, Take 60 mg by mouth every evening. , Disp: , Rfl:  .  fluticasone furoate-vilanterol (BREO ELLIPTA) 100-25 MCG/INH AEPB, Inhale one puff once daily to prevent cough or wheeze, Disp: 60 each, Rfl: 5 .  insulin glargine (LANTUS) 100 unit/mL SOPN, Inject 12 Units into the skin at bedtime., Disp: , Rfl:  .  Lacosamide (VIMPAT) 150 MG TABS, Take 1 tablet (150 mg total) by mouth 2 (two) times daily., Disp: 60 tablet, Rfl: 5 .  metoprolol tartrate (LOPRESSOR) 25 MG tablet, Take 0.5 tablets (12.5 mg total) by mouth 2 (two) times daily., Disp: 60 tablet, Rfl: 0 .  midodrine (PROAMATINE) 2.5 MG tablet, Take 1 tablet (2.5 mg total) by mouth 2 (two) times daily with a meal., Disp: 60 tablet, Rfl: 1 .  multivitamin (RENA-VIT) TABS tablet, Take 1 tablet by mouth at bedtime., Disp: 30 tablet, Rfl: 1 .  pantoprazole (PROTONIX) 40 MG tablet, Take 1 tablet (40 mg total) by mouth 2 (two) times daily., Disp: 60 tablet, Rfl: 1 .  lamoTRIgine (LAMICTAL) 150 MG tablet, Take 1 tablet (150 mg total) by mouth 2 (two) times daily., Disp: 180 tablet, Rfl: 1  Allergies:  Allergies  Allergen Reactions  . Codeine Rash and Other (See   Comments)    Unknown reaction (patient says it was more serious than just a rash, but he can't remember what happened)    Past Medical History, Surgical history, Social history, and Family History were reviewed and updated.  Physical Exam: Blood pressure 124/72, pulse 77, temperature 98.5 F (36.9 C), temperature source Oral, resp. rate 18, height 5' 10" (1.778 m), weight 216 lb 14.4 oz (98.4  kg), SpO2 99 %. ECOG: 1 General appearance: Alert, awake gentleman without distress. Head: Normocephalic, without obvious abnormality no oral ulcers or lesions.  Neck: no adenopathy Lymph nodes: Cervical, supraclavicular, and axillary nodes normal. Heart:regular rate and rhythm, S1, S2 normal, no murmur, click, rub or gallop Lung:chest clear, no wheezing, rales, normal symmetric air entry. No dullness to percussion. Abdomin: soft, non-tender, without masses or organomegaly.  EXT:no erythema, induration, or nodules He is able to ambulate without any difficulties. No neurological deficits noted.  Lab Results: Lab Results  Component Value Date   WBC 12.2 (H) 10/14/2016   HGB 9.8 (L) 10/14/2016   HCT 27.7 (L) 10/14/2016   MCV 89.4 10/14/2016   PLT 406 (H) 10/14/2016     Chemistry      Component Value Date/Time   NA 136 10/14/2016 0943   K 3.7 10/14/2016 0943   CL 95 (L) 10/06/2016 1338   CO2 27 10/14/2016 0943   BUN 36.2 (H) 10/14/2016 0943   CREATININE 9.0 (HH) 10/14/2016 0943      Component Value Date/Time   CALCIUM 9.0 10/14/2016 0943   ALKPHOS 96 10/14/2016 0943   AST 15 10/14/2016 0943   ALT 12 10/14/2016 0943   BILITOT 0.93 10/14/2016 0943       Results for Isaiah [name] (MRN [id number]) as of 10/21/2016 10:42  Ref. Range 01/01/2016 10:27 04/08/2016 09:18 07/08/2016 09:21 10/14/2016 09:43  IgG (Immunoglobin G), Serum Latest Ref Range: 700 - 1600 mg/dL 2,206 (H) 3,556 (H) 3,422 (H) 4,168 (H)    Results for Isaiah [name] (MRN [id number]) as of 10/21/2016 10:42  Ref. Range 01/01/2016 10:27 04/08/2016 09:18 07/08/2016 09:21 10/14/2016 09:43  M Protein SerPl Elph-Mcnc Latest Ref Range: Not Observed g/dL 1.4 (H) 2.5 (H) 2.5 (H) 3.4 (H)        Impression and Plan: This is a 50-year-old gentleman with the following issues:   1. Monoclonal gammopathy, IgG subtype representing monoclonal gammopathy of undetermined significance versus smoldering multiple myeloma. He had 2 bone  marrow biopsies done which showed plasma cell disorder related between 7-13%. His IgG level continued to be relatively stable in the last 3 years around 2600-2800. His M spike had increased close to 2.5 g/dL.   Repeat protein studies in February 2018 were reviewed today and showed some his M protein is elevated to 3.4 g/dL with an IgG level of 4100.  Given these recent changes, I have recommended repeat staging workup including a bone marrow biopsy and a skeletal survey. If he has evidence to suggest evolving multiple myeloma, definitive therapy may be needed at that time.   2. Anemia, which is multifactorial and definitely has an element of hemoglobinopathy. He will be receiving growth factor support with hemodialysis. His last hemoglobin has declined slightly which could indicate evolving plasma cell disorder.   3. Renal insufficiency: Likely related to long-standing hypertension and diabetes and less likely a plasma cell disorder. He is receiving hemodialysis at this time.  4. Recurrent thrombosis: He is off anticoagulation at this time because of recurrent GI bleed..  5. Follow-up: Will be   in the next few weeks to discuss his staging workup.     [facility], MD 2/13/201810:40 AM

## 2016-10-21 NOTE — Telephone Encounter (Signed)
Appointments scheduled per 2/13 LOS. Patient given AVS report and calendars with future scheduled appointments.

## 2016-10-22 ENCOUNTER — Other Ambulatory Visit: Payer: Self-pay | Admitting: Physical Medicine and Rehabilitation

## 2016-10-22 DIAGNOSIS — N2581 Secondary hyperparathyroidism of renal origin: Secondary | ICD-10-CM | POA: Diagnosis not present

## 2016-10-22 DIAGNOSIS — D631 Anemia in chronic kidney disease: Secondary | ICD-10-CM | POA: Diagnosis not present

## 2016-10-22 DIAGNOSIS — D689 Coagulation defect, unspecified: Secondary | ICD-10-CM | POA: Diagnosis not present

## 2016-10-22 DIAGNOSIS — Z7901 Long term (current) use of anticoagulants: Secondary | ICD-10-CM | POA: Diagnosis not present

## 2016-10-22 DIAGNOSIS — E1129 Type 2 diabetes mellitus with other diabetic kidney complication: Secondary | ICD-10-CM | POA: Diagnosis not present

## 2016-10-22 DIAGNOSIS — N186 End stage renal disease: Secondary | ICD-10-CM | POA: Diagnosis not present

## 2016-10-22 DIAGNOSIS — D509 Iron deficiency anemia, unspecified: Secondary | ICD-10-CM | POA: Diagnosis not present

## 2016-10-23 MED FILL — MIDODRINE HCL 2.5 MG TABLET: 2.5 | 90 days supply | Qty: 90 | Fill #0

## 2016-10-24 DIAGNOSIS — E1129 Type 2 diabetes mellitus with other diabetic kidney complication: Secondary | ICD-10-CM | POA: Diagnosis not present

## 2016-10-24 DIAGNOSIS — D689 Coagulation defect, unspecified: Secondary | ICD-10-CM | POA: Diagnosis not present

## 2016-10-24 DIAGNOSIS — N2581 Secondary hyperparathyroidism of renal origin: Secondary | ICD-10-CM | POA: Diagnosis not present

## 2016-10-24 DIAGNOSIS — D509 Iron deficiency anemia, unspecified: Secondary | ICD-10-CM | POA: Diagnosis not present

## 2016-10-24 DIAGNOSIS — D631 Anemia in chronic kidney disease: Secondary | ICD-10-CM | POA: Diagnosis not present

## 2016-10-24 DIAGNOSIS — Z7901 Long term (current) use of anticoagulants: Secondary | ICD-10-CM | POA: Diagnosis not present

## 2016-10-24 DIAGNOSIS — N186 End stage renal disease: Secondary | ICD-10-CM | POA: Diagnosis not present

## 2016-10-24 MED FILL — CONTOUR NEXT STRIPS: 33 days supply | Qty: 100 | Fill #6

## 2016-10-27 DIAGNOSIS — D509 Iron deficiency anemia, unspecified: Secondary | ICD-10-CM | POA: Diagnosis not present

## 2016-10-27 DIAGNOSIS — D689 Coagulation defect, unspecified: Secondary | ICD-10-CM | POA: Diagnosis not present

## 2016-10-27 DIAGNOSIS — N2581 Secondary hyperparathyroidism of renal origin: Secondary | ICD-10-CM | POA: Diagnosis not present

## 2016-10-27 DIAGNOSIS — N186 End stage renal disease: Secondary | ICD-10-CM | POA: Diagnosis not present

## 2016-10-27 DIAGNOSIS — E1129 Type 2 diabetes mellitus with other diabetic kidney complication: Secondary | ICD-10-CM | POA: Diagnosis not present

## 2016-10-27 MED FILL — VIMPAT 150 MG TABLET: 150 | 30 days supply | Qty: 60 | Fill #4

## 2016-10-27 MED FILL — PANTOPRAZOLE SOD DR 40 MG T: 40 | 90 days supply | Qty: 90 | Fill #2

## 2016-10-28 DIAGNOSIS — I871 Compression of vein: Secondary | ICD-10-CM | POA: Diagnosis not present

## 2016-10-28 DIAGNOSIS — Z992 Dependence on renal dialysis: Secondary | ICD-10-CM | POA: Diagnosis not present

## 2016-10-28 DIAGNOSIS — T82858A Stenosis of vascular prosthetic devices, implants and grafts, initial encounter: Secondary | ICD-10-CM | POA: Diagnosis not present

## 2016-10-28 DIAGNOSIS — N186 End stage renal disease: Secondary | ICD-10-CM | POA: Diagnosis not present

## 2016-10-29 DIAGNOSIS — E1129 Type 2 diabetes mellitus with other diabetic kidney complication: Secondary | ICD-10-CM | POA: Diagnosis not present

## 2016-10-29 DIAGNOSIS — D509 Iron deficiency anemia, unspecified: Secondary | ICD-10-CM | POA: Diagnosis not present

## 2016-10-29 DIAGNOSIS — N2581 Secondary hyperparathyroidism of renal origin: Secondary | ICD-10-CM | POA: Diagnosis not present

## 2016-10-29 DIAGNOSIS — N186 End stage renal disease: Secondary | ICD-10-CM | POA: Diagnosis not present

## 2016-10-29 DIAGNOSIS — D689 Coagulation defect, unspecified: Secondary | ICD-10-CM | POA: Diagnosis not present

## 2016-10-30 DIAGNOSIS — E11319 Type 2 diabetes mellitus with unspecified diabetic retinopathy without macular edema: Secondary | ICD-10-CM | POA: Diagnosis not present

## 2016-10-30 DIAGNOSIS — H53489 Generalized contraction of visual field, unspecified eye: Secondary | ICD-10-CM | POA: Diagnosis not present

## 2016-10-30 DIAGNOSIS — H524 Presbyopia: Secondary | ICD-10-CM | POA: Diagnosis not present

## 2016-10-30 DIAGNOSIS — E119 Type 2 diabetes mellitus without complications: Secondary | ICD-10-CM | POA: Diagnosis not present

## 2016-10-30 DIAGNOSIS — H52223 Regular astigmatism, bilateral: Secondary | ICD-10-CM | POA: Diagnosis not present

## 2016-10-30 DIAGNOSIS — H25013 Cortical age-related cataract, bilateral: Secondary | ICD-10-CM | POA: Diagnosis not present

## 2016-10-31 DIAGNOSIS — N186 End stage renal disease: Secondary | ICD-10-CM | POA: Diagnosis not present

## 2016-10-31 DIAGNOSIS — D689 Coagulation defect, unspecified: Secondary | ICD-10-CM | POA: Diagnosis not present

## 2016-10-31 DIAGNOSIS — E1129 Type 2 diabetes mellitus with other diabetic kidney complication: Secondary | ICD-10-CM | POA: Diagnosis not present

## 2016-10-31 DIAGNOSIS — D509 Iron deficiency anemia, unspecified: Secondary | ICD-10-CM | POA: Diagnosis not present

## 2016-10-31 DIAGNOSIS — N2581 Secondary hyperparathyroidism of renal origin: Secondary | ICD-10-CM | POA: Diagnosis not present

## 2016-11-03 DIAGNOSIS — D689 Coagulation defect, unspecified: Secondary | ICD-10-CM | POA: Diagnosis not present

## 2016-11-03 DIAGNOSIS — Z7901 Long term (current) use of anticoagulants: Secondary | ICD-10-CM | POA: Diagnosis not present

## 2016-11-03 DIAGNOSIS — D509 Iron deficiency anemia, unspecified: Secondary | ICD-10-CM | POA: Diagnosis not present

## 2016-11-03 DIAGNOSIS — N186 End stage renal disease: Secondary | ICD-10-CM | POA: Diagnosis not present

## 2016-11-03 DIAGNOSIS — D631 Anemia in chronic kidney disease: Secondary | ICD-10-CM | POA: Diagnosis not present

## 2016-11-03 DIAGNOSIS — E1129 Type 2 diabetes mellitus with other diabetic kidney complication: Secondary | ICD-10-CM | POA: Diagnosis not present

## 2016-11-03 DIAGNOSIS — N2581 Secondary hyperparathyroidism of renal origin: Secondary | ICD-10-CM | POA: Diagnosis not present

## 2016-11-04 ENCOUNTER — Telehealth: Payer: Self-pay | Admitting: Neurology

## 2016-11-04 NOTE — Telephone Encounter (Signed)
FYI- Patients wife called office in reference to a possible switch of providers from Dr. Leonie Man to Dr. Jannifer Franklin for seizures.  Wife advised both Drs. Will communication but it is not guaranteed.  Patient has an appointment on 11/05/16 with Dr. Leonie Man and states she will discuss this at his visit.

## 2016-11-05 ENCOUNTER — Ambulatory Visit (INDEPENDENT_AMBULATORY_CARE_PROVIDER_SITE_OTHER): Payer: 59 | Admitting: Neurology

## 2016-11-05 ENCOUNTER — Encounter: Payer: Self-pay | Admitting: Neurology

## 2016-11-05 VITALS — BP 115/70 | HR 65 | Wt 218.0 lb

## 2016-11-05 DIAGNOSIS — Q211 Atrial septal defect: Secondary | ICD-10-CM | POA: Diagnosis not present

## 2016-11-05 DIAGNOSIS — D689 Coagulation defect, unspecified: Secondary | ICD-10-CM | POA: Diagnosis not present

## 2016-11-05 DIAGNOSIS — I639 Cerebral infarction, unspecified: Secondary | ICD-10-CM

## 2016-11-05 DIAGNOSIS — E1129 Type 2 diabetes mellitus with other diabetic kidney complication: Secondary | ICD-10-CM | POA: Diagnosis not present

## 2016-11-05 DIAGNOSIS — G40909 Epilepsy, unspecified, not intractable, without status epilepticus: Secondary | ICD-10-CM

## 2016-11-05 DIAGNOSIS — E1122 Type 2 diabetes mellitus with diabetic chronic kidney disease: Secondary | ICD-10-CM | POA: Diagnosis not present

## 2016-11-05 DIAGNOSIS — Q2112 Patent foramen ovale: Secondary | ICD-10-CM

## 2016-11-05 DIAGNOSIS — D509 Iron deficiency anemia, unspecified: Secondary | ICD-10-CM | POA: Diagnosis not present

## 2016-11-05 DIAGNOSIS — N186 End stage renal disease: Secondary | ICD-10-CM | POA: Diagnosis not present

## 2016-11-05 DIAGNOSIS — N2581 Secondary hyperparathyroidism of renal origin: Secondary | ICD-10-CM | POA: Diagnosis not present

## 2016-11-05 DIAGNOSIS — R4701 Aphasia: Secondary | ICD-10-CM

## 2016-11-05 DIAGNOSIS — I6789 Other cerebrovascular disease: Secondary | ICD-10-CM | POA: Diagnosis not present

## 2016-11-05 DIAGNOSIS — D631 Anemia in chronic kidney disease: Secondary | ICD-10-CM | POA: Diagnosis not present

## 2016-11-05 DIAGNOSIS — Z7901 Long term (current) use of anticoagulants: Secondary | ICD-10-CM | POA: Diagnosis not present

## 2016-11-05 DIAGNOSIS — E1165 Type 2 diabetes mellitus with hyperglycemia: Secondary | ICD-10-CM | POA: Diagnosis not present

## 2016-11-05 DIAGNOSIS — G40309 Generalized idiopathic epilepsy and epileptic syndromes, not intractable, without status epilepticus: Secondary | ICD-10-CM | POA: Diagnosis not present

## 2016-11-05 DIAGNOSIS — Z992 Dependence on renal dialysis: Secondary | ICD-10-CM | POA: Diagnosis not present

## 2016-11-05 NOTE — Patient Instructions (Addendum)
I had a long discussion with the patient and his wife regarding his recent breakthrough seizures while he was being tapered off Dilantin. I recommend he continue Vimpat 150 twice daily and continue Lamotrigine  150 mg twice daily. Avoid seizure provoking stimuli like sleep deprivation, medication noncompliance. I discussed possibly increasing the dose of lamotrigine but the patient wants to hold off for now. Continue aspirin for stroke prevention. 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 I will refer the patient to Dr. Sherren Mocha to discuss PFO closure I also advised him to do orthostatic tolerance exercises for his orthostatic dizziness. I advised him to discuss with nephrologist his dialysis regimen since he has had a few episodes of near syncope after dialysis is completed. He will return for follow-up in 3 months with Cecille Rubin, nurse practitioner call earlier if necessary.

## 2016-11-05 NOTE — Progress Notes (Signed)
[facility]  PATIENT: Isaiah [name] DOB: 1966/08/03   REASON FOR VISIT: Follow-up for recent seizure  , history of stroke, right hemiparesis history of GI bleed  HISTORY FROM: Patient wife     HISTORY OF PRESENT ILLNESS HISTORY PS:Isaiah [name] is a 51 year old African-American gentleman seen today for first office follow-up visit following hospital consultation with Dr. Erlinda Hong in Oct 23, 2015. He is accompanied by his wife. He has a complicated PMH of DM type 2 with diabetic neuropathy, Sickle cell-thalassemia,  DVT and PE x 2 in 07/2011 and 1/ 2016 off coumadin due to GIB, OSA, CKD stage V, MGUS and anemia .He was recently admitted to [facility] in [place] on 09/16/15 with N/V and blood stools with acute on chronic renal failure due to acute pancreatitis. He was treated with supportive care, IV bicarb as well as multiple units PRBC and EGD with severe stage IV erosive esophagitis with gastritis.HD initiated on 01/09 due to poor recover with decrease in UOP. He developed hypoxic respiratory failure and was found to have RUL/RML PE as well as left popliteal DVT. He was treated with IV heparin. IVC filter was placed around 09/21/15 and post procedure was found to have right hand weakness. CT head on 09/26/15 reported possible subacut right MCA/PCA and left cerebellar infarcts which were not correlate to his right hand weakness. Neurology was consulted for input and EMG showed evidence of profound neuropathy. He had worsening of symptoms with increase in right sided weakness, facial droop speech difficulty on 09/27/15. MRI on 09/29/15 showedscattered right MCA and left MCA, left thalamus and b/l cerebellar infarcts. The infarcts on CT 09/26/15 likely to be acute too. On 09/30/15, he had worsening of aphasia and repeat MRI showed extension of left MCA frontal infarcts. MRA head showed left M2 superior branch occlusion. Echo with positive bubble study  and TEE positive for PFO. Hypercoagulation studies reported to be negative. IV heparin was changed to Lovenox due to recurrent embolic strokes on heparin.  He was then transferred here to [facility] rehab on 10/12/15. He was reported to have small amount of blood in stool on admission. Continued HD for CKD after admission. Due to CKD on HD, his lovenox discontinued and put back on heparin IV. His hematologist Dr. Alen Blew was consulted for anticoagulation use. He commented "As far as long-term anticoagulation, I feel be reasonable to consider aspirin alone and hope that IVC filter will prevent any future embolic strokes from occurring. An alternative strategy is to consider warfarin and follow closely for signs or symptoms of bleeding." since admission, Patient made significant improvement on his aphasia and right side weakness, and has been able to walk with assistance and speak much more fluent. His H&H  remained stable and no obvious bleeding reported. Dr. Erlinda Hong recommended starting warfarin. Patient has tolerated warfarin well without any more episodes of bleeding and hematocrit has remained stable. He is currently participating in outpatient physical, occupational and speech therapy and making good improvement. His able to states speaks short sentences now with some word hesitancy. He still has significant weakness in his right hand and grip but is able to walk with a cane. His balance is good is had no major falls. He does complain of pain and stiffness in his legs and hand particularly at night at times he has to wake up because of discomfort. He is being followed in the rehabilitation clinic but currently is not taking any medications for spasticity. His sugars  have all been under good control. Update 03/25/2016 PS: He returns for follow-up after last visit with me 3 months ago. He is accompanied today by his son. He was hospitalized on 4/312017 with a seizure. He was started on Vimpat 100 twice daily. EEG showed  generalized slowing without definite epileptiform activity. Limited MRI scan the brain was obtained on 12/3115 which showed multiple late subacute to chronic infarcts and her repeat MRI on 5/117 showed resolution and no definite new acute infarct. Patient was initially felt to be candidate for rehabilitation but after prolonged postictal phase for several days he made rapid improvement and was discharged home. Doing well. His able to walk without assistance. His 2 has mild expressive aphasia but speech is improving. Still has mild right-sided weakness. He walks with dragging his right leg. Is tolerating Vimpat well without side effects. He remains on warfarin and is tolerating it well without bleeding or bruising. He states his fasting sugars 7 well controlled. He has no new complaints. UPDATE 10/12/2017[name], 51 year old male returns for follow-up. With medical history of right and left  CVA in January 2017 end-stage renal disease previous pulmonary emboli, seizure disorder ,diabetes, hypertension previously on Coumadin. He was on vacation in [place] in September and admitted on 05/28/16 after a dialysis treatment he had a seizure event and was hospitalized he was also found to be anemic with a hemoglobin of 6.8 indicating positive stools in the emergency room he was admitted for complete workup. He received multiple blood transfusions.  He received a loading dose of Keppra in the emergency room and his Vimpat dose was increased to 150 mg twice daily. EEG performed was mildly abnormal due to the presence of mild intermittent generalized theta slowing which suggest mild encephalopathy of nonspecific etiology. He was hospital for 12 days and return to [place]. He denies further seizure events. He denies further stroke or TIA symptoms Continues  mild expressive aphasia and mild right-sided weakness. He returns for reevaluation.  UPDATE 11/16/2017[name], 51 year old male returns for follow-up he has  a history of right and left CVA in January 2017, end-stage renal disease on dialysis, seizure disorder, diabetes hypertension recent GI bleed while vacationing in [place] September 2017 after a dialysis treatment he had a seizure event and was hospitalized with hemoglobin of 6.8. His aspirin and Coumadin remain on hold. His GI doctor is Dr. Michail Sermon who he will see again in the next week or so. He is currently on Vimpat 150 twice daily. He had another seizure after dialysis on 07/14/2016 and was taken to the emergency room where he was placed on Dilantin 300 mg daily. Wife does not want him to be on Dilantin long term is wanting him to switch to a different medication. Fortunately he denies further stroke or TIA symptoms. He remains an speech and occupational therapy and he continues to have mild right hemiparesis and mild expressive aphasia. He returns to the office today for reevaluation  UPDATE 12/18/2017[name], 51 year old male returns for follow-up. He was seen in the emergency room on 08/20/16 for a seizure. He had an abscess of the right foreheadwhich  was I&Ded at that time. He remains on antibiotics. He is currently on Lamictal 50 in the morning and 100 at night along with Vimpat 150 twice daily. He is on Dilantin 300 however wife does not want him to be on medication long-term. His aspirin 0.81 has been restarted by  GI doctor,Dr. Michail Sermon. He has not had further  stroke or TIA symptoms. His therapies have concluded. He remains with a mild right hemiparesis and mild expressive aphasia. He returns for reevaluation Update 11/05/2016 ; he returns for follow-up after last visit 2 months ago. He had a breakthrough seizure on 10/06/16 he was seen in [facility] emergency room. There is no obvious trigger noted. He was asked to continue his home medications. The patient had just tapered Dilantin and stopped it a few days later. The patient's wife is accompanying him appears quite frustrated with patient  continuing to have seizures. She in fact requested to transfer the patient's care to Dr. Jannifer Franklin for after I had a long discussion with the patient and his wife regarding incidence of breakthrough seizures and discussion of a little but treatment options date have decided to stay with me. I offered increasing the dose of lamotrigine but the patient is scared of having dizziness and possible side effects and would like to wait and continue the current dose for now. The patient has opted some improvement in his speech but can talk more fluently now though his speech is quite hesitant. Still has mild right hemiparesis and spasticity and has to walk slowly. He is had no falls or injuries. Remains on Vimpat 150 twice daily and lamotrigine 150 mg twice daily. The patient does complain of orthostasis and presyncopal symptoms during dialysis when he stands up. He admits his blood pressure tends to run quite low. He is taking midodrine every day as well. He is on aspirin 81 mg and tolerating them well. REVIEW OF SYSTEMS: Full 14 system review of systems performed and notable only for those listed, all others are neg:  Seizure, dizziness, speech difficulty, gait difficulty and all other systems negative  ALLERGIES: Allergies  Allergen Reactions  . Codeine Rash and Other (See Comments)    Unknown reaction (patient says it was more serious than just a rash, but he can't remember what happened)    HOME MEDICATIONS: Outpatient Medications Prior to Visit  Medication Sig Dispense Refill  . albuterol (PROAIR HFA) 108 (90 Base) MCG/ACT inhaler Inhale two puffs every 4-6 hours if needed for cough or wheeze 1 Inhaler 1  . aspirin EC 81 MG tablet Take 1 tablet (81 mg total) by mouth daily. 100 tablet 0  . cinacalcet (SENSIPAR) 60 MG tablet Take 60 mg by mouth every evening.     . fluticasone furoate-vilanterol (BREO ELLIPTA) 100-25 MCG/INH AEPB Inhale one puff once daily to prevent cough or wheeze 60 each 5  . insulin  glargine (LANTUS) 100 unit/mL SOPN Inject 12 Units into the skin at bedtime.    . Lacosamide (VIMPAT) 150 MG TABS Take 1 tablet (150 mg total) by mouth 2 (two) times daily. 60 tablet 5  . lamoTRIgine (LAMICTAL) 150 MG tablet Take 1 tablet (150 mg total) by mouth 2 (two) times daily. 180 tablet 1  . metoprolol tartrate (LOPRESSOR) 25 MG tablet Take 0.5 tablets (12.5 mg total) by mouth 2 (two) times daily. 60 tablet 0  . midodrine (PROAMATINE) 2.5 MG tablet Take 1 tablet (2.5 mg total) by mouth 2 (two) times daily with a meal. (Patient taking differently: Take 2.5 mg by mouth 2 (two) times daily with a meal. ) 60 tablet 1  . multivitamin (RENA-VIT) TABS tablet Take 1 tablet by mouth at bedtime. 30 tablet 1  . pantoprazole (PROTONIX) 40 MG tablet Take 1 tablet (40 mg total) by mouth 2 (two) times daily. 60 tablet 1   No facility-administered  medications prior to visit.     PAST MEDICAL HISTORY: Past Medical History:  Diagnosis Date  . A-fib ([place])   . Acute renal failure ([place]) 07/2011  . Anemia   . Asthma   . Diabetes mellitus without complication ([place])   . ESRD (end stage renal disease) on dialysis ([place])   . Gout   . History of recent blood transfusion 10/27/14   2 Units PRBC's  . Hyperkalemia 07/2011  . Hypertension   . Monoclonal gammopathies   . Monoclonal gammopathy   . OSA on CPAP   . Pulmonary embolism ([place]) 07/2011   a. Tx with Coumadin for 6 months (unknown cause per patient).  . Pulmonary embolism ([place]) 07/2011; 09/27/2014   a. Bilat PE 07/2011 - unclear cause, tx with 6 months Coumadin.;   . Seizures ([place])   . Sickle cell-thalassemia disease ([place])    a. Sickle cell trait.  . Sleep apnea    a. uses CPAP.  [name] Stroke [facility])     PAST SURGICAL HISTORY: Past Surgical History:  Procedure Laterality Date  . AV FISTULA PLACEMENT Right 12/05/2015   Procedure: INSERTION OF ARTERIOVENOUS (AV) GORE-TEX GRAFT ARM;  Surgeon: Elam Dutch, MD;  Location: [place];  Service: Vascular;   Laterality: Right;  . BONE MARROW BIOPSY    . CHOLECYSTECTOMY    . CHOLECYSTECTOMY  1990's?  . EYE SURGERY Right   . INSERTION OF DIALYSIS CATHETER Right 10/16/2015   Procedure: INSERTION OF PALINDROME DIALYSIS CATHETER ;  Surgeon: Elam Dutch, MD;  Location: [place];  Service: Vascular;  Laterality: Right;  . OTHER SURGICAL HISTORY     Retinal surgery  . PARS PLANA VITRECTOMY  02/17/2012   Procedure: PARS PLANA VITRECTOMY WITH 25 GAUGE;  Surgeon: Hayden Pedro, MD;  Location: [place];  Service: Ophthalmology;  Laterality: Right;  . PERIPHERAL VASCULAR CATHETERIZATION N/A 03/20/2016   Procedure: A/V Shuntogram/Fistulagram;  Surgeon: Conrad Lynd, MD;  Location: [place] [facility];  Service: Cardiovascular;  Laterality: N/A;    FAMILY HISTORY: Family History  Problem Relation Age of Onset  . Hypertension Mother   . Diabetes Mother   . Stroke Father   . Sickle cell anemia Brother   . Diabetes type I Brother   . Kidney disease Brother     SOCIAL HISTORY: Social History   Social History  . Marital status: Married    Spouse name: sylvia  . Number of children: 1  . Years of education: college   Occupational History  . [employer] History Main Topics  . Smoking status: Never Smoker  . Smokeless tobacco: Never Used  . Alcohol use No  . Drug use: No  . Sexual activity: Yes   Other Topics Concern  . Not on file   Social History Narrative   ** Merged History Encounter **          Vitals:   11/05/16 1112  BP: 115/70  Pulse: 65  Weight: 218 lb (98.9 kg)   Body mass index is 31.28 kg/m. General: well developed, well nourished, middle-aged African-American male seated, in no evident distress Head: head normocephalic and atraumatic.  Neck: supple with no carotid  bruits Cardiovascular: regular rate and rhythm, no murmurs Musculoskeletal: no deformity Skin:  no rash/petichiae Vascular:   fistula right wrist area for dialysis   Neurological  examination  Mental Status: Awake and fully alert. Oriented to place and time. Recent and remote memory intact. Attention  span, concentration and fund of knowledge Is slightly impaired. Mood and affect appropriate. Mild expressive aphasia with word finding difficulties and dysfluency. but able to speak sentences Good comprehension, naming and repetition. Cranial Nerves: Fundoscopic exam not done.  . Pupils equal, briskly reactive to light. Extraocular movements full without nystagmus. Visual fields full to confrontation. Hearing intact. Moderate right lower facial weakness. Facial sensation intact. Face, tongue, palate moves normally and symmetrically.  Motor: Spastic right hemiparesis with 4/5 right shoulder and elbow strength with mild  weakness of the right grip and intrinsic hand muscles. 4/5 right lower extremity strength with spasticity and increased tone. Ankle dorsiflexors weak. Sensory.:   touch ,pinprick .position and vibratory sensation slightly diminished on the right compared to the left.  Coordination: Impaired on the right side and normal on the left  Gait and Station: Arises from chair without difficulty. Spastic hemiplegic gait , no assistive device Reflexes: 1+ and symmetric. Toes downgoing.    DIAGNOSTIC DATA (LABS, IMAGING, TESTING) - I reviewed patient records, labs, notes, testing and imaging myself where available.  Lab Results  Component Value Date   WBC 12.2 (H) 10/14/2016   HGB 9.8 (L) 10/14/2016   HCT 27.7 (L) 10/14/2016   MCV 89.4 10/14/2016   PLT 406 (H) 10/14/2016      Component Value Date/Time   NA 136 10/14/2016 0943   K 3.7 10/14/2016 0943   CL 95 (L) 10/06/2016 1338   CO2 27 10/14/2016 0943   GLUCOSE 101 10/14/2016 0943   BUN 36.2 (H) 10/14/2016 0943   CREATININE 9.0 (HH) 10/14/2016 0943   CALCIUM 9.0 10/14/2016 0943   PROT 9.8 (H) 10/14/2016 0943   ALBUMIN 3.8 10/14/2016 0943   AST 15 10/14/2016 0943   ALT 12 10/14/2016 0943   ALKPHOS 96  10/14/2016 0943   BILITOT 0.93 10/14/2016 0943   GFRNONAA 4 (L) 10/06/2016 1338   GFRAA 4 (L) 10/06/2016 1338   Lab Results  Component Value Date   CHOL 128 10/24/2015   HDL 48 10/24/2015   LDLCALC 62 10/24/2015   TRIG 88 10/24/2015   CHOLHDL 2.7 10/24/2015   Lab Results  Component Value Date   HGBA1C 4.4 (L) 01/06/2016   Lab Results  Component Value Date   WCBJSEGB15 176 10/24/2015   Lab Results  Component Value Date   TSH 0.896 01/06/2016     ASSESSMENT AND PLAN 51 year old African-American male with bilateral anterior and posterior circulation embolic infarcts in January 2017 secondary to paradoxical embolism from deep vein thrombosis, pulmonary embolism and patent foramen ovale while he was off Coumadin due to GI bleeding with prolonged hospitalization in [facility] but was transferred here for rehabilitation. Repeat admission in April 2017 for seizure. He has residual expressive aphasia and   spastic right hemiparesis which are improving . Vascular risk factors of diabetes, hypertension, sleep apnea. Repeat hospitalization in September 2017 [place] for seizure events after dialysis and GI bleed. Another seizure on 07/14/2016 after dialysis, seen in the emergency room and placed on Dilantin.Seen again in the ER on 08/20/16.  with seizure I&D of right forehead  for abscess. Another breakthrough seizure in January 2018. Other brief episodes of transient unresponsiveness following dialysis likely presyncopal events  PLAN:  I had a long discussion with the patient and his wife regarding his recent breakthrough seizures while he was being tapered off Dilantin. I recommend he continue Vimpat 150 twice daily and continue Lamotrigine  150 mg twice daily. Avoid seizure provoking stimuli like sleep deprivation,  medication noncompliance. I discussed possibly increasing the dose of lamotrigine but the patient wants to hold off for now. Continue aspirin for stroke prevention. We also  discussed possibility of endovascular PFO closure given the fact that he had been a stroke related to paradoxical embolism and he is not a long-term anticoagulation candidate due to his GI bleeding. I will refer the patient to Dr. Sherren Mocha to discuss PFO closure I also advised him to do orthostatic tolerance exercises for his orthostatic dizziness. I advised him to discuss with nephrologist his dialysis regimen since he has had a few episodes of near syncope after dialysis is completed. Greater than 50% time during this prolonged 45  minute visit was spent on counseling about seizure risk, seizure triggers and discussion about treatment option as well as patent foramen ovale and discussion about treatment options and answering questions He will return for follow-up in 3 months with Cecille Rubin, nurse practitioner call earlier if necessary. Antony Contras, MD [name] [address] [place], [place] [address] [phone]

## 2016-11-07 DIAGNOSIS — D689 Coagulation defect, unspecified: Secondary | ICD-10-CM | POA: Diagnosis not present

## 2016-11-07 DIAGNOSIS — N2581 Secondary hyperparathyroidism of renal origin: Secondary | ICD-10-CM | POA: Diagnosis not present

## 2016-11-07 DIAGNOSIS — E1129 Type 2 diabetes mellitus with other diabetic kidney complication: Secondary | ICD-10-CM | POA: Diagnosis not present

## 2016-11-07 DIAGNOSIS — N186 End stage renal disease: Secondary | ICD-10-CM | POA: Diagnosis not present

## 2016-11-10 DIAGNOSIS — D689 Coagulation defect, unspecified: Secondary | ICD-10-CM | POA: Diagnosis not present

## 2016-11-10 DIAGNOSIS — N2581 Secondary hyperparathyroidism of renal origin: Secondary | ICD-10-CM | POA: Diagnosis not present

## 2016-11-10 DIAGNOSIS — D509 Iron deficiency anemia, unspecified: Secondary | ICD-10-CM | POA: Diagnosis not present

## 2016-11-10 DIAGNOSIS — N186 End stage renal disease: Secondary | ICD-10-CM | POA: Diagnosis not present

## 2016-11-10 DIAGNOSIS — E1129 Type 2 diabetes mellitus with other diabetic kidney complication: Secondary | ICD-10-CM | POA: Diagnosis not present

## 2016-11-10 MED FILL — BREO ELLIPTA 200-25 MCG INH: 200-25 | 30 days supply | Qty: 60 | Fill #0

## 2016-11-11 ENCOUNTER — Other Ambulatory Visit: Payer: Self-pay | Admitting: Radiology

## 2016-11-11 DIAGNOSIS — N2581 Secondary hyperparathyroidism of renal origin: Secondary | ICD-10-CM | POA: Insufficient documentation

## 2016-11-11 DIAGNOSIS — E669 Obesity, unspecified: Secondary | ICD-10-CM | POA: Insufficient documentation

## 2016-11-11 DIAGNOSIS — I10 Essential (primary) hypertension: Secondary | ICD-10-CM | POA: Insufficient documentation

## 2016-11-11 DIAGNOSIS — I69311 Memory deficit following cerebral infarction: Secondary | ICD-10-CM | POA: Diagnosis not present

## 2016-11-11 DIAGNOSIS — K219 Gastro-esophageal reflux disease without esophagitis: Secondary | ICD-10-CM | POA: Insufficient documentation

## 2016-11-11 DIAGNOSIS — I272 Pulmonary hypertension, unspecified: Secondary | ICD-10-CM

## 2016-11-11 DIAGNOSIS — E1122 Type 2 diabetes mellitus with diabetic chronic kidney disease: Secondary | ICD-10-CM | POA: Diagnosis not present

## 2016-11-11 DIAGNOSIS — Q2112 Patent foramen ovale: Secondary | ICD-10-CM

## 2016-11-11 DIAGNOSIS — I8290 Acute embolism and thrombosis of unspecified vein: Secondary | ICD-10-CM

## 2016-11-11 DIAGNOSIS — K922 Gastrointestinal hemorrhage, unspecified: Secondary | ICD-10-CM | POA: Insufficient documentation

## 2016-11-11 DIAGNOSIS — Z86718 Personal history of other venous thrombosis and embolism: Secondary | ICD-10-CM | POA: Diagnosis not present

## 2016-11-11 DIAGNOSIS — I959 Hypotension, unspecified: Secondary | ICD-10-CM | POA: Insufficient documentation

## 2016-11-11 DIAGNOSIS — J449 Chronic obstructive pulmonary disease, unspecified: Secondary | ICD-10-CM | POA: Insufficient documentation

## 2016-11-11 DIAGNOSIS — I639 Cerebral infarction, unspecified: Secondary | ICD-10-CM | POA: Insufficient documentation

## 2016-11-11 DIAGNOSIS — I871 Compression of vein: Secondary | ICD-10-CM | POA: Insufficient documentation

## 2016-11-11 DIAGNOSIS — Z01818 Encounter for other preprocedural examination: Secondary | ICD-10-CM | POA: Diagnosis not present

## 2016-11-11 DIAGNOSIS — N186 End stage renal disease: Secondary | ICD-10-CM | POA: Diagnosis not present

## 2016-11-11 DIAGNOSIS — R569 Unspecified convulsions: Secondary | ICD-10-CM | POA: Insufficient documentation

## 2016-11-11 DIAGNOSIS — Z7682 Awaiting organ transplant status: Secondary | ICD-10-CM | POA: Diagnosis not present

## 2016-11-11 DIAGNOSIS — Q211 Atrial septal defect: Secondary | ICD-10-CM | POA: Insufficient documentation

## 2016-11-11 DIAGNOSIS — Z86711 Personal history of pulmonary embolism: Secondary | ICD-10-CM | POA: Diagnosis not present

## 2016-11-11 HISTORY — DX: Acute embolism and thrombosis of unspecified vein: I82.90

## 2016-11-11 HISTORY — DX: Chronic obstructive pulmonary disease, unspecified: J44.9

## 2016-11-11 HISTORY — DX: Secondary hyperparathyroidism of renal origin: N25.81

## 2016-11-11 HISTORY — DX: Hypotension, unspecified: I95.9

## 2016-11-11 HISTORY — DX: Compression of vein: I87.1

## 2016-11-11 HISTORY — DX: Patent foramen ovale: Q21.12

## 2016-11-11 HISTORY — DX: Pulmonary hypertension, unspecified: I27.20

## 2016-11-11 HISTORY — DX: Gastro-esophageal reflux disease without esophagitis: K21.9

## 2016-11-12 DIAGNOSIS — D509 Iron deficiency anemia, unspecified: Secondary | ICD-10-CM | POA: Diagnosis not present

## 2016-11-12 DIAGNOSIS — N2581 Secondary hyperparathyroidism of renal origin: Secondary | ICD-10-CM | POA: Diagnosis not present

## 2016-11-12 DIAGNOSIS — D689 Coagulation defect, unspecified: Secondary | ICD-10-CM | POA: Diagnosis not present

## 2016-11-12 DIAGNOSIS — E1129 Type 2 diabetes mellitus with other diabetic kidney complication: Secondary | ICD-10-CM | POA: Diagnosis not present

## 2016-11-12 DIAGNOSIS — N186 End stage renal disease: Secondary | ICD-10-CM | POA: Diagnosis not present

## 2016-11-13 ENCOUNTER — Ambulatory Visit (HOSPITAL_COMMUNITY)
Admission: RE | Admit: 2016-11-13 | Discharge: 2016-11-13 | Disposition: A | Discharge status: Home / Self Care | Payer: 59 | Source: Ambulatory Visit | Attending: Oncology | Admitting: Oncology

## 2016-11-13 ENCOUNTER — Encounter (HOSPITAL_COMMUNITY): Payer: Self-pay

## 2016-11-13 ENCOUNTER — Other Ambulatory Visit: Payer: Self-pay | Admitting: Oncology

## 2016-11-13 DIAGNOSIS — N186 End stage renal disease: Secondary | ICD-10-CM | POA: Insufficient documentation

## 2016-11-13 DIAGNOSIS — C9030 Solitary plasmacytoma not having achieved remission: Secondary | ICD-10-CM | POA: Insufficient documentation

## 2016-11-13 DIAGNOSIS — Z992 Dependence on renal dialysis: Secondary | ICD-10-CM | POA: Insufficient documentation

## 2016-11-13 DIAGNOSIS — Z9049 Acquired absence of other specified parts of digestive tract: Secondary | ICD-10-CM | POA: Insufficient documentation

## 2016-11-13 DIAGNOSIS — M109 Gout, unspecified: Secondary | ICD-10-CM | POA: Insufficient documentation

## 2016-11-13 DIAGNOSIS — R569 Unspecified convulsions: Secondary | ICD-10-CM | POA: Diagnosis not present

## 2016-11-13 DIAGNOSIS — Z9989 Dependence on other enabling machines and devices: Secondary | ICD-10-CM | POA: Diagnosis not present

## 2016-11-13 DIAGNOSIS — D573 Sickle-cell trait: Secondary | ICD-10-CM | POA: Insufficient documentation

## 2016-11-13 DIAGNOSIS — D72829 Elevated white blood cell count, unspecified: Secondary | ICD-10-CM | POA: Diagnosis not present

## 2016-11-13 DIAGNOSIS — J45909 Unspecified asthma, uncomplicated: Secondary | ICD-10-CM | POA: Insufficient documentation

## 2016-11-13 DIAGNOSIS — Z79899 Other long term (current) drug therapy: Secondary | ICD-10-CM | POA: Diagnosis not present

## 2016-11-13 DIAGNOSIS — Z86711 Personal history of pulmonary embolism: Secondary | ICD-10-CM | POA: Diagnosis not present

## 2016-11-13 DIAGNOSIS — I4891 Unspecified atrial fibrillation: Secondary | ICD-10-CM | POA: Insufficient documentation

## 2016-11-13 DIAGNOSIS — D472 Monoclonal gammopathy: Secondary | ICD-10-CM | POA: Insufficient documentation

## 2016-11-13 DIAGNOSIS — D4989 Neoplasm of unspecified behavior of other specified sites: Secondary | ICD-10-CM | POA: Diagnosis not present

## 2016-11-13 DIAGNOSIS — E1122 Type 2 diabetes mellitus with diabetic chronic kidney disease: Secondary | ICD-10-CM | POA: Diagnosis not present

## 2016-11-13 DIAGNOSIS — Z9889 Other specified postprocedural states: Secondary | ICD-10-CM | POA: Diagnosis not present

## 2016-11-13 DIAGNOSIS — I120 Hypertensive chronic kidney disease with stage 5 chronic kidney disease or end stage renal disease: Secondary | ICD-10-CM | POA: Insufficient documentation

## 2016-11-13 DIAGNOSIS — E875 Hyperkalemia: Secondary | ICD-10-CM | POA: Diagnosis not present

## 2016-11-13 DIAGNOSIS — Z7982 Long term (current) use of aspirin: Secondary | ICD-10-CM | POA: Insufficient documentation

## 2016-11-13 DIAGNOSIS — G4733 Obstructive sleep apnea (adult) (pediatric): Secondary | ICD-10-CM | POA: Diagnosis not present

## 2016-11-13 DIAGNOSIS — D649 Anemia, unspecified: Secondary | ICD-10-CM | POA: Diagnosis not present

## 2016-11-13 DIAGNOSIS — Z8673 Personal history of transient ischemic attack (TIA), and cerebral infarction without residual deficits: Secondary | ICD-10-CM | POA: Insufficient documentation

## 2016-11-13 DIAGNOSIS — Z885 Allergy status to narcotic agent status: Secondary | ICD-10-CM | POA: Diagnosis not present

## 2016-11-13 DIAGNOSIS — Z794 Long term (current) use of insulin: Secondary | ICD-10-CM | POA: Insufficient documentation

## 2016-11-13 LAB — PROTIME-INR
INR: 1.13
Prothrombin Time: 14.5 seconds (ref 11.4–15.2)

## 2016-11-13 LAB — CBC WITH DIFFERENTIAL/PLATELET
Basophils Absolute: 0.1 10*3/uL (ref 0.0–0.1)
Basophils Relative: 1 %
Eosinophils Absolute: 0.3 10*3/uL (ref 0.0–0.7)
Eosinophils Relative: 2 %
HCT: 29.6 % — ABNORMAL LOW (ref 39.0–52.0)
Hemoglobin: 10.9 g/dL — ABNORMAL LOW (ref 13.0–17.0)
Lymphocytes Relative: 20 %
Lymphs Abs: 2.6 10*3/uL (ref 0.7–4.0)
MCH: 32.3 pg (ref 26.0–34.0)
MCHC: 36.8 g/dL — ABNORMAL HIGH (ref 30.0–36.0)
MCV: 87.8 fL (ref 78.0–100.0)
Monocytes Absolute: 1.6 10*3/uL — ABNORMAL HIGH (ref 0.1–1.0)
Monocytes Relative: 12 %
Neutro Abs: 8.5 10*3/uL — ABNORMAL HIGH (ref 1.7–7.7)
Neutrophils Relative %: 65 %
Platelets: 399 10*3/uL (ref 150–400)
RBC: 3.37 MIL/uL — ABNORMAL LOW (ref 4.22–5.81)
RDW: 16.6 % — ABNORMAL HIGH (ref 11.5–15.5)
WBC: 13.1 10*3/uL — ABNORMAL HIGH (ref 4.0–10.5)

## 2016-11-13 LAB — BASIC METABOLIC PANEL
Anion gap: 8 (ref 5–15)
BUN: 39 mg/dL — ABNORMAL HIGH (ref 6–20)
CO2: 29 mmol/L (ref 22–32)
Calcium: 9.3 mg/dL (ref 8.9–10.3)
Chloride: 98 mmol/L — ABNORMAL LOW (ref 101–111)
Creatinine, Ser: 8.48 mg/dL — ABNORMAL HIGH (ref 0.61–1.24)
GFR calc Af Amer: 7 mL/min — ABNORMAL LOW (ref >60)
GFR calc non Af Amer: 6 mL/min — ABNORMAL LOW (ref >60)
Glucose, Bld: 125 mg/dL — ABNORMAL HIGH (ref 65–99)
Potassium: 3.6 mmol/L (ref 3.5–5.1)
Sodium: 135 mmol/L (ref 135–145)

## 2016-11-13 LAB — GLUCOSE, CAPILLARY: Glucose-Capillary: 110 mg/dL — ABNORMAL HIGH (ref 65–99)

## 2016-11-13 MED ORDER — MIDAZOLAM HCL 2 MG/2ML IJ SOLN
INTRAMUSCULAR | Status: AC | PRN
  Administered 2016-11-13 (×3): 1 mg via INTRAVENOUS

## 2016-11-13 MED ORDER — FENTANYL CITRATE (PF) 100 MCG/2ML IJ SOLN
INTRAMUSCULAR | Status: AC
  Filled 2016-11-13: qty 6

## 2016-11-13 MED ORDER — MIDAZOLAM HCL 2 MG/2ML IJ SOLN
INTRAMUSCULAR | Status: AC
  Filled 2016-11-13: qty 6

## 2016-11-13 MED ORDER — SODIUM CHLORIDE 0.9 % IV SOLN
INTRAVENOUS | Status: DC
  Administered 2016-11-13: 10:00:00 via INTRAVENOUS

## 2016-11-13 MED ORDER — FENTANYL CITRATE (PF) 100 MCG/2ML IJ SOLN
INTRAMUSCULAR | Status: AC | PRN
  Administered 2016-11-13: 50 ug via INTRAVENOUS
  Administered 2016-11-13: 25 ug via INTRAVENOUS

## 2016-11-13 NOTE — Sedation Documentation (Signed)
Patient denies pain and is resting comfortably.  

## 2016-11-13 NOTE — H&P (Signed)
Referring Physician(s): Wyatt Portela  Supervising Physician: Markus Daft  Patient Status:   Isaiah [name]  Chief Complaint:  "I'm having another bone marrow biopsy"  Subjective: Patient familiar to IR service from prior bone marrow biopsies. He has a history of MGUS, anemia and chronic renal failure- on HD. He presents today for repeat CT-guided bone marrow biopsy to rule out myeloma.  Past Medical History:  Diagnosis Date  . A-fib ([place])   . Acute renal failure ([place]) 07/2011  . Anemia   . Asthma   . Diabetes mellitus without complication ([place])   . ESRD (end stage renal disease) on dialysis ([place])   . Gout   . History of recent blood transfusion 10/27/14   2 Units PRBC's  . Hyperkalemia 07/2011  . Hypertension   . Monoclonal gammopathies   . Monoclonal gammopathy   . OSA on CPAP   . Pulmonary embolism ([place]) 07/2011   a. Tx with Coumadin for 6 months (unknown cause per patient).  . Pulmonary embolism ([place]) 07/2011; 09/27/2014   a. Bilat PE 07/2011 - unclear cause, tx with 6 months Coumadin.;   . Seizures ([place])   . Sickle cell-thalassemia disease ([place])    a. Sickle cell trait.  . Sleep apnea    a. uses CPAP.  [name])    Past Surgical History:  Procedure Laterality Date  . AV FISTULA PLACEMENT Right 12/05/2015   Procedure: INSERTION OF ARTERIOVENOUS (AV) GORE-TEX GRAFT ARM;  Surgeon: Elam Dutch, MD;  Location: [facility];  Service: Vascular;  Laterality: Right;  . BONE MARROW BIOPSY    . CHOLECYSTECTOMY    . CHOLECYSTECTOMY  1990's?  . EYE SURGERY Right   . INSERTION OF DIALYSIS CATHETER Right 10/16/2015   Procedure: INSERTION OF PALINDROME DIALYSIS CATHETER ;  Surgeon: Elam Dutch, MD;  Location: [place];  Service: Vascular;  Laterality: Right;  . OTHER SURGICAL HISTORY     Retinal surgery  . PARS PLANA VITRECTOMY  02/17/2012   Procedure: PARS PLANA VITRECTOMY WITH 25 GAUGE;  Surgeon: Hayden Pedro, MD;  Location: [facility];  Service: Ophthalmology;  Laterality:  Right;  . PERIPHERAL VASCULAR CATHETERIZATION N/A 03/20/2016   Procedure: A/V Shuntogram/Fistulagram;  Surgeon: Conrad [place], MD;  Location: [facility];  Service: Cardiovascular;  Laterality: N/A;    Allergies: Codeine  Medications: Prior to Admission medications   Medication Sig Start Date End Date Taking? Authorizing Provider  albuterol (PROAIR HFA) 108 (90 Base) MCG/ACT inhaler Inhale two puffs every 4-6 hours if needed for cough or wheeze 04/08/16   Jiles Prows, MD  aspirin EC 81 MG tablet Take 1 tablet (81 mg total) by mouth daily. 11/02/15   Bary Leriche, PA-C  BAYER CONTOUR NEXT TEST test strip  10/24/16   Historical Provider, MD  cinacalcet (SENSIPAR) 60 MG tablet Take 60 mg by mouth every evening.  12/27/15   Historical Provider, MD  fluticasone furoate-vilanterol (BREO ELLIPTA) 100-25 MCG/INH AEPB Inhale one puff once daily to prevent cough or wheeze 04/08/16   Jiles Prows, MD  insulin glargine (LANTUS) 100 unit/mL SOPN Inject 12 Units into the skin at bedtime.    Historical Provider, MD  Lacosamide (VIMPAT) 150 MG TABS Take 1 tablet (150 mg total) by mouth 2 (two) times daily. 06/19/16   Dennie Bible, NP  lamoTRIgine (LAMICTAL) 150 MG tablet Take 1 tablet (150 mg total) by mouth 2 (two) times daily. 09/29/16   Dennie Bible,  NP  metoprolol tartrate (LOPRESSOR) 25 MG tablet Take 0.5 tablets (12.5 mg total) by mouth 2 (two) times daily. 01/10/16   [name], MD  midodrine (PROAMATINE) 2.5 MG tablet Take 1 tablet (2.5 mg total) by mouth 2 (two) times daily with a meal. Patient taking differently: Take 2.5 mg by mouth 2 (two) times daily with a meal.  11/02/15   Ivan Anchors Love, PA-C  multivitamin (RENA-VIT) TABS tablet Take 1 tablet by mouth at bedtime. 11/02/15   Ivan Anchors Love, PA-C  pantoprazole (PROTONIX) 40 MG tablet Take 1 tablet (40 mg total) by mouth 2 (two) times daily. 11/02/15   Bary Leriche, PA-C  phenytoin (DILANTIN) 100 MG ER capsule  09/07/16    Historical Provider, MD     Vital Signs: Vitals:   11/13/16 0910  BP: 123/84  Pulse: 85  Temp: 98.5 F (36.9 C)       Physical Exam awake alert; chest clear to auscultation bilaterally. Heart with regular rate and rhythm. Abdomen soft, positive bowel sounds, nontender. Lower extremities with no edema. Noted slight right upper extremity >lower extremity weakness, slight right facial asymmetry-weakness/tongue deviation to right from prior CVA; right upper extremity fistula with positive thrill/bruit.  Imaging: No results found.  Labs:  CBC:  Recent Labs  07/14/16 1818 08/20/16 1344 10/06/16 1338 10/14/16 0943  WBC 12.9* 16.2* 11.7* 12.2*  HGB 12.0* 13.6 9.5* 9.8*  HCT 33.7* 36.8* 26.4* 27.7*  PLT 269 270 329 406*    COAGS:  Recent Labs  01/06/16 1144  03/14/16 1046 03/20/16 0615 03/25/16 1217 07/14/16 1818  INR 2.53*  < > 2.3 1.16 1.7 1.08  APTT 41*  --   --   --   --   --   < > = values in this interval not displayed.  BMP:  Recent Labs  01/11/16 0400 03/20/16 0633  07/14/16 1818 08/20/16 1344 10/06/16 1338 10/14/16 0943  NA 141 138  < > 135 136 135 136  K 4.5 3.3*  < > 4.1 4.5 4.8 3.7  CL 101 96*  --  99* 97* 95*  --   CO2 25  --   < > 20* 22 23 27   GLUCOSE 99 150*  < > 137* 112* 126* 101  BUN 36* 30*  < > 34* 48* 62* 36.2*  CALCIUM 7.1*  --   < > 7.8* 8.3* 8.3* 9.0  CREATININE 6.15* 6.30*  < > 9.21* 12.27* 13.26* 9.0*  GFRNONAA 9*  --   --  6* 4* 4*  --   GFRAA 11*  --   --  7* 5* 4*  --   < > = values in this interval not displayed.  LIVER FUNCTION TESTS:  Recent Labs  07/14/16 1818 08/20/16 1344 10/06/16 1338 10/14/16 0943 10/14/16 0943  BILITOT 1.1 0.7 0.5  --  0.93  AST 31 28 36  --  15  ALT 32 20 22  --  12  ALKPHOS 94 89 77  --  96  PROT 9.6* 9.7* 9.5* 9.7* 9.8*  ALBUMIN 3.9 3.7 3.7  --  3.8    Assessment and Plan: Pt with history of MGUS, anemia and chronic renal failure- on HD, prior CVA/seizures. He presents today for  repeat CT-guided bone marrow biopsy to rule out myeloma.Risks and benefits discussed with the patient/spouse including, but not limited to bleeding, infection, damage to adjacent structures or low yield requiring additional tests.All of the patient's questions were answered, patient is agreeable  to proceed.Consent signed and in chart.      Electronically Signed: D. Rowe Robert 11/13/2016, 9:06 AM   I spent a total of 20 minutes at the the patient's bedside AND on the patient's hospital floor or unit, greater than 50% of which was counseling/coordinating care for CT-guided bone marrow biopsy

## 2016-11-13 NOTE — Discharge Instructions (Signed)
Moderate Conscious Sedation, Adult, Care After These instructions provide you with information about caring for yourself after your procedure. Your health care provider may also give you more specific instructions. Your treatment has been planned according to current medical practices, but problems sometimes occur. Call your health care provider if you have any problems or questions after your procedure. What can I expect after the procedure? After your procedure, it is common:  To feel sleepy for several hours.  To feel clumsy and have poor balance for several hours.  To have poor judgment for several hours.  To vomit if you eat too soon. Follow these instructions at home: For at least 24 hours after the procedure:    Do not:  Participate in activities where you could fall or become injured.  Drive.  Use heavy machinery.  Drink alcohol.  Take sleeping pills or medicines that cause drowsiness.  Make important decisions or sign legal documents.  Take care of children on your own.  Rest. Eating and drinking   Follow the diet recommended by your health care provider.  If you vomit:  Drink water, juice, or soup when you can drink without vomiting.  Make sure you have little or no nausea before eating solid foods. General instructions   Have a responsible adult stay with you until you are awake and alert.  Take over-the-counter and prescription medicines only as told by your health care provider.  If you smoke, do not smoke without supervision.  Keep all follow-up visits as told by your health care provider. This is important. Contact a health care provider if:  You keep feeling nauseous or you keep vomiting.  You feel light-headed.  You develop a rash.  You have a fever. Get help right away if:  You have trouble breathing. This information is not intended to replace advice given to you by your health care provider. Make sure you discuss any questions you  have with your health care provider. Document Released: 06/15/2013 Document Revised: 01/28/2016 Document Reviewed: 12/15/2015 Elsevier Interactive Patient Education  2017 [place]. Bone Marrow Aspiration and Bone Marrow Biopsy, Adult, Care After This sheet gives you information about how to care for yourself after your procedure. Your health care provider may also give you more specific instructions. If you have problems or questions, contact your health care provider. What can I expect after the procedure? After the procedure, it is common to have:  Mild pain and tenderness.  Swelling.  Bruising. Follow these instructions at home:  Take over-the-counter or prescription medicines only as told by your health care provider.  Do not take baths, swim, or use a hot tub until your health care provider approves. Ask if you can take a shower or have a sponge bath.  Follow instructions from your health care provider about how to take care of the puncture site. Make sure you:  Wash your hands with soap and water before you change your bandage (dressing). If soap and water are not available, use hand sanitizer.  Change your dressing as told by your health care provider.  Check your puncture siteevery day for signs of infection. Check for:  More redness, swelling, or pain.  More fluid or blood.  Warmth.  Pus or a bad smell.  Return to your normal activities as told by your health care provider. Ask your health care provider what activities are safe for you.  Do not drive for 24 hours if you were given a medicine to help you relax (  sedative). °· Keep all follow-up visits as told by your health care provider. This is important. °Contact a health care provider if: °· You have more redness, swelling, or pain around the puncture site. °· You have more fluid or blood coming from the puncture site. °· Your puncture site feels warm to the touch. °· You have pus or a bad smell coming from the  puncture site. °· You have a fever. °· Your pain is not controlled with medicine. °This information is not intended to replace advice given to you by your health care provider. Make sure you discuss any questions you have with your health care provider. °Document Released: 03/14/2005 Document Revised: 03/14/2016 Document Reviewed: 02/06/2016 °Elsevier Interactive Patient Education © 2017 Elsevier Inc. ° °

## 2016-11-13 NOTE — Procedures (Signed)
  Pre-operative Diagnosis: MGUS        Post-operative Diagnosis: MGUS   Indications: Needs repeat bone marrow  Procedure: CT guided bone marrow biopsy  Findings: 2 aspirates and 1 core  Complications: None     EBL: Minimal  Plan: Bedrest 1 hour.

## 2016-11-14 DIAGNOSIS — D509 Iron deficiency anemia, unspecified: Secondary | ICD-10-CM | POA: Diagnosis not present

## 2016-11-14 DIAGNOSIS — E1129 Type 2 diabetes mellitus with other diabetic kidney complication: Secondary | ICD-10-CM | POA: Diagnosis not present

## 2016-11-14 DIAGNOSIS — N2581 Secondary hyperparathyroidism of renal origin: Secondary | ICD-10-CM | POA: Diagnosis not present

## 2016-11-14 DIAGNOSIS — N186 End stage renal disease: Secondary | ICD-10-CM | POA: Diagnosis not present

## 2016-11-14 DIAGNOSIS — D689 Coagulation defect, unspecified: Secondary | ICD-10-CM | POA: Diagnosis not present

## 2016-11-17 DIAGNOSIS — E1129 Type 2 diabetes mellitus with other diabetic kidney complication: Secondary | ICD-10-CM | POA: Diagnosis not present

## 2016-11-17 DIAGNOSIS — N186 End stage renal disease: Secondary | ICD-10-CM | POA: Diagnosis not present

## 2016-11-17 DIAGNOSIS — N2581 Secondary hyperparathyroidism of renal origin: Secondary | ICD-10-CM | POA: Diagnosis not present

## 2016-11-17 DIAGNOSIS — Z7901 Long term (current) use of anticoagulants: Secondary | ICD-10-CM | POA: Diagnosis not present

## 2016-11-17 DIAGNOSIS — D631 Anemia in chronic kidney disease: Secondary | ICD-10-CM | POA: Diagnosis not present

## 2016-11-17 DIAGNOSIS — D689 Coagulation defect, unspecified: Secondary | ICD-10-CM | POA: Diagnosis not present

## 2016-11-17 DIAGNOSIS — D509 Iron deficiency anemia, unspecified: Secondary | ICD-10-CM | POA: Diagnosis not present

## 2016-11-18 ENCOUNTER — Ambulatory Visit (HOSPITAL_BASED_OUTPATIENT_CLINIC_OR_DEPARTMENT_OTHER): Payer: 59 | Admitting: Oncology

## 2016-11-18 VITALS — BP 120/62 | HR 75 | Temp 98.1°F | Resp 17 | Wt 219.6 lb

## 2016-11-18 DIAGNOSIS — D631 Anemia in chronic kidney disease: Secondary | ICD-10-CM

## 2016-11-18 DIAGNOSIS — N189 Chronic kidney disease, unspecified: Secondary | ICD-10-CM

## 2016-11-18 DIAGNOSIS — D472 Monoclonal gammopathy: Secondary | ICD-10-CM | POA: Diagnosis not present

## 2016-11-18 DIAGNOSIS — Z992 Dependence on renal dialysis: Secondary | ICD-10-CM | POA: Diagnosis not present

## 2016-11-18 NOTE — Progress Notes (Signed)
Hematology and Oncology Follow Up Visit  Viola [id number] 1965/09/11 51 y.o. 11/18/2016 4:29 PM    Principle Diagnosis: 51 year old gentleman with the following diagnoses:   1. Monoclonal gammopathy diagnosed in 2012. He presented with  IgG kappa MGUS and  M spike about 1 g/dL and bone marrow biopsy showed 7% plasma cell involvement without evidence of myeloma in 12/2010. He did not have any lytic bone lesions. Repeat a bone marrow biopsy in 10/16/2014 showed 13% plasma cell infiltration and no lytic bone lesions on his skeletal survey. His most recent serum bone marrow biopsy in March 2018 showed close to 20% plasma cell infiltration.  2. Multifactorial anemia: The etiology includes anemia of renal disease, plasma cell disorder, myeloproliferative disorder, and GI bleeding.   3. Chronic renal failure hemodialysis-dependent are not related that the plasma cell disorder. Renal failure is related to long-standing diabetes and hypertension.  4. He is status post CVA and right-sided weakness and speech difficulties in February 2017.  Interim History:  Mr. Meister presents today for a followup visit with his wife. Since the last visit, he reports feeling reasonably well without any complaints. He remains active and participate in physical therapy and able to ambulate without any major difficulties. He does not report any recent seizures or hospitalizations. He does not report any falls or syncope. His appetite has been excellent and does not report any pathological fractures or bone pain. He continues to tolerate hemodialysis on Monday Wednesday Friday without major problems. He is currently undergoing evaluation for possible transplant.  He does not report any headaches, blurry vision, syncope. He does not report any fevers, chills, sweats or weight loss. He does not report any  palpitation, orthopnea or leg edema. Does not report any cough or hemoptysis or hematemesis. He does not report any  nausea, vomiting. He does not report any frequency, urgency or hesitancy. Rest of his review of systems unremarkable.   Medications: I have reviewed the patient's current medications.  Current Outpatient Prescriptions:  .  albuterol (PROAIR HFA) 108 (90 Base) MCG/ACT inhaler, Inhale two puffs every 4-6 hours if needed for cough or wheeze, Disp: 1 Inhaler, Rfl: 1 .  aspirin EC 81 MG tablet, Take 1 tablet (81 mg total) by mouth daily., Disp: 100 tablet, Rfl: 0 .  BAYER CONTOUR NEXT TEST test strip, , Disp: , Rfl: 99 .  cinacalcet (SENSIPAR) 60 MG tablet, Take 60 mg by mouth every evening. , Disp: , Rfl:  .  fluticasone furoate-vilanterol (BREO ELLIPTA) 100-25 MCG/INH AEPB, Inhale one puff once daily to prevent cough or wheeze, Disp: 60 each, Rfl: 5 .  insulin glargine (LANTUS) 100 unit/mL SOPN, Inject 12 Units into the skin at bedtime., Disp: , Rfl:  .  Lacosamide (VIMPAT) 150 MG TABS, Take 1 tablet (150 mg total) by mouth 2 (two) times daily., Disp: 60 tablet, Rfl: 5 .  lamoTRIgine (LAMICTAL) 150 MG tablet, Take 1 tablet (150 mg total) by mouth 2 (two) times daily., Disp: 180 tablet, Rfl: 1 .  metoprolol tartrate (LOPRESSOR) 25 MG tablet, Take 0.5 tablets (12.5 mg total) by mouth 2 (two) times daily., Disp: 60 tablet, Rfl: 0 .  midodrine (PROAMATINE) 2.5 MG tablet, Take 1 tablet (2.5 mg total) by mouth 2 (two) times daily with a meal. (Patient taking differently: Take 2.5 mg by mouth 2 (two) times daily with a meal. ), Disp: 60 tablet, Rfl: 1 .  multivitamin (RENA-VIT) TABS tablet, Take 1 tablet by mouth at bedtime., Disp: 30  tablet, Rfl: 1 .  pantoprazole (PROTONIX) 40 MG tablet, Take 1 tablet (40 mg total) by mouth 2 (two) times daily., Disp: 60 tablet, Rfl: 1  Allergies:  Allergies  Allergen Reactions  . Codeine Rash and Other (See Comments)    Unknown reaction (patient says it was more serious than just a rash, but he can't remember what happened)    Past Medical History, Surgical  history, Social history, and Family History were reviewed and updated.  Physical Exam: Blood pressure 120/62, pulse 75, temperature 98.1 F (36.7 C), temperature source Oral, resp. rate 17, weight 219 lb 9.6 oz (99.6 kg). ECOG: 1 General appearance: Well-appearing gentleman appeared without distress. Head: Normocephalic, without obvious abnormality no oral thrush or ulcers. Neck: no adenopathy Lymph nodes: Cervical, supraclavicular, and axillary nodes normal. Heart:regular rate and rhythm, S1, S2 normal, no murmur, click, rub or gallop Lung:chest clear, no wheezing, rales, normal symmetric air entry. No rebound or guarding. Abdomin: soft, non-tender, without masses or organomegaly.  EXT:no erythema, induration, or nodules He is able to ambulate without any difficulties. No neurological deficits noted.  Lab Results: Lab Results  Component Value Date   WBC 13.1 (H) 11/13/2016   HGB 10.9 (L) 11/13/2016   HCT 29.6 (L) 11/13/2016   MCV 87.8 11/13/2016   PLT 399 11/13/2016     Chemistry      Component Value Date/Time   NA 135 11/13/2016 0925   NA 136 10/14/2016 0943   K 3.6 11/13/2016 0925   K 3.7 10/14/2016 0943   CL 98 (L) 11/13/2016 0925   CO2 29 11/13/2016 0925   CO2 27 10/14/2016 0943   BUN 39 (H) 11/13/2016 0925   BUN 36.2 (H) 10/14/2016 0943   CREATININE 8.48 (H) 11/13/2016 0925   CREATININE 9.0 (HH) 10/14/2016 0943      Component Value Date/Time   CALCIUM 9.3 11/13/2016 0925   CALCIUM 9.0 10/14/2016 0943   ALKPHOS 96 10/14/2016 0943   AST 15 10/14/2016 0943   ALT 12 10/14/2016 0943   BILITOT 0.93 10/14/2016 0943       Results for ODESSA, MORREN (MRN [id number]) as of 10/21/2016 10:42  Ref. Range 01/01/2016 10:27 04/08/2016 09:18 07/08/2016 09:21 10/14/2016 09:43  IgG (Immunoglobin G), Serum Latest Ref Range: 700 - 1600 mg/dL 2,206 (H) 3,556 (H) 3,422 (H) 4,168 (H)    Results for ELIUS, ETHEREDGE (MRN [id number]) as of 10/21/2016 10:42  Ref. Range 01/01/2016 10:27  04/08/2016 09:18 07/08/2016 09:21 10/14/2016 09:43  M Protein SerPl Elph-Mcnc Latest Ref Range: Not Observed g/dL 1.4 (H) 2.5 (H) 2.5 (H) 3.4 (H)        Impression and Plan: This is a 51 year old gentleman with the following issues:   1. Monoclonal gammopathy, IgG subtype representing monoclonal gammopathy of undetermined significance versus smoldering multiple myeloma. He had 2 bone marrow biopsies done which showed plasma cell disorder related between 7-13%. His IgG level continued to be relatively stable in the last 3 years around 2600-2800. His M spike had increased close to 2.5 g/dL.  Repeat protein studies in February 2018 were reviewed today and showed some his M protein is elevated to 3.4 g/dL with an IgG level of 4100.  Bone marrow biopsy obtained on 11/13/2016 which indicate increase in his plasma cell percentage close to 20%. These findings were suggest evolving smoldering myeloma from MGUS. He does not appear to have active symptomatic multiple myeloma. His skeletal survey did not reveal any changes to suggest bony involvement. His renal failure  has been chronic and unrelated to plasma cell disorder.  The natural course of this disease was reviewed with the patient and he is at increased risk of evolving into multiple myeloma but his disease has been rather slow since 2012. Risks and benefits of active until myeloma treatment were discussed today and he opted to continue with observation and surveillance at this time.   2. Anemia, which is multifactorial and definitely has an element of hemoglobinopathy. He will be receiving growth factor support with hemodialysis. His last hemoglobin remains stable at this time.  3. Renal insufficiency: Likely related to long-standing hypertension and diabetes and less likely a plasma cell disorder. He is receiving hemodialysis at this time. He is under evaluation for possible kidney transplant which I see no objections to at this time.  4. Recurrent  thrombosis: He is off anticoagulation at this time because of recurrent GI bleed..  5. Follow-up: Will be in 3 months for surveillance of his plasma cell disorder.    [facility], MD 3/13/20184:29 PM

## 2016-11-19 DIAGNOSIS — N186 End stage renal disease: Secondary | ICD-10-CM | POA: Diagnosis not present

## 2016-11-19 DIAGNOSIS — D509 Iron deficiency anemia, unspecified: Secondary | ICD-10-CM | POA: Diagnosis not present

## 2016-11-19 DIAGNOSIS — E1129 Type 2 diabetes mellitus with other diabetic kidney complication: Secondary | ICD-10-CM | POA: Diagnosis not present

## 2016-11-19 DIAGNOSIS — D689 Coagulation defect, unspecified: Secondary | ICD-10-CM | POA: Diagnosis not present

## 2016-11-19 DIAGNOSIS — Z7901 Long term (current) use of anticoagulants: Secondary | ICD-10-CM | POA: Diagnosis not present

## 2016-11-19 DIAGNOSIS — D631 Anemia in chronic kidney disease: Secondary | ICD-10-CM | POA: Diagnosis not present

## 2016-11-19 DIAGNOSIS — N2581 Secondary hyperparathyroidism of renal origin: Secondary | ICD-10-CM | POA: Diagnosis not present

## 2016-11-21 DIAGNOSIS — E1129 Type 2 diabetes mellitus with other diabetic kidney complication: Secondary | ICD-10-CM | POA: Diagnosis not present

## 2016-11-21 DIAGNOSIS — D509 Iron deficiency anemia, unspecified: Secondary | ICD-10-CM | POA: Diagnosis not present

## 2016-11-21 DIAGNOSIS — D631 Anemia in chronic kidney disease: Secondary | ICD-10-CM | POA: Diagnosis not present

## 2016-11-21 DIAGNOSIS — N2581 Secondary hyperparathyroidism of renal origin: Secondary | ICD-10-CM | POA: Diagnosis not present

## 2016-11-21 DIAGNOSIS — Z7901 Long term (current) use of anticoagulants: Secondary | ICD-10-CM | POA: Diagnosis not present

## 2016-11-21 DIAGNOSIS — N186 End stage renal disease: Secondary | ICD-10-CM | POA: Diagnosis not present

## 2016-11-21 DIAGNOSIS — D689 Coagulation defect, unspecified: Secondary | ICD-10-CM | POA: Diagnosis not present

## 2016-11-24 DIAGNOSIS — N186 End stage renal disease: Secondary | ICD-10-CM | POA: Diagnosis not present

## 2016-11-24 DIAGNOSIS — E1129 Type 2 diabetes mellitus with other diabetic kidney complication: Secondary | ICD-10-CM | POA: Diagnosis not present

## 2016-11-24 DIAGNOSIS — N2581 Secondary hyperparathyroidism of renal origin: Secondary | ICD-10-CM | POA: Diagnosis not present

## 2016-11-24 DIAGNOSIS — D509 Iron deficiency anemia, unspecified: Secondary | ICD-10-CM | POA: Diagnosis not present

## 2016-11-24 DIAGNOSIS — D689 Coagulation defect, unspecified: Secondary | ICD-10-CM | POA: Diagnosis not present

## 2016-11-24 LAB — CHROMOSOME ANALYSIS, BONE MARROW

## 2016-11-24 LAB — TISSUE HYBRIDIZATION (BONE MARROW)-NCBH

## 2016-11-24 MED FILL — SENSIPAR 60 MG TABLET: 60 | 30 days supply | Qty: 30 | Fill #5

## 2016-11-24 MED FILL — CALCIUM ACETATE 667 MG CAP: 667 | 30 days supply | Qty: 180 | Fill #5

## 2016-11-25 MED FILL — CONTOUR NEXT STRIPS: 33 days supply | Qty: 100 | Fill #7

## 2016-11-26 DIAGNOSIS — D689 Coagulation defect, unspecified: Secondary | ICD-10-CM | POA: Diagnosis not present

## 2016-11-26 DIAGNOSIS — E1129 Type 2 diabetes mellitus with other diabetic kidney complication: Secondary | ICD-10-CM | POA: Diagnosis not present

## 2016-11-26 DIAGNOSIS — D509 Iron deficiency anemia, unspecified: Secondary | ICD-10-CM | POA: Diagnosis not present

## 2016-11-26 DIAGNOSIS — N186 End stage renal disease: Secondary | ICD-10-CM | POA: Diagnosis not present

## 2016-11-26 DIAGNOSIS — N2581 Secondary hyperparathyroidism of renal origin: Secondary | ICD-10-CM | POA: Diagnosis not present

## 2016-11-27 ENCOUNTER — Encounter: Payer: Self-pay | Admitting: *Deleted

## 2016-11-27 ENCOUNTER — Ambulatory Visit (INDEPENDENT_AMBULATORY_CARE_PROVIDER_SITE_OTHER): Payer: 59 | Admitting: Cardiovascular Disease

## 2016-11-27 ENCOUNTER — Encounter: Payer: Self-pay | Admitting: Cardiovascular Disease

## 2016-11-27 VITALS — BP 108/72 | HR 70 | Ht 70.0 in | Wt 215.4 lb

## 2016-11-27 DIAGNOSIS — Q2112 Patent foramen ovale: Secondary | ICD-10-CM

## 2016-11-27 DIAGNOSIS — Q211 Atrial septal defect: Secondary | ICD-10-CM | POA: Diagnosis not present

## 2016-11-27 NOTE — Progress Notes (Signed)
Cardiology Office Note Date:  11/27/2016   ID:  [name], [name] 18-Feb-1966, MRN [id number]  PCP:  Foye Spurling, MD  Cardiologist:  Sherren Mocha, MD   Requesting Physician: Dr Leonie Man  Chief Complaint  Patient presents with  . PFO consult     History of Present Illness: Isaiah [name] is a 51 y.o. male who presents for evaluation of PFO closure, referred by Dr Leonie Man.   The patient has a complex medical history that includes ESRD on intermittent hemodialysis and history of recurrent DVT/PE. He has undergone IVC filter placement and anticoagulation has been discontinued because of chronic GI bleeding.   The patient was hospitalized in [place] in January 2017 with acute GI bleeding, pancreatitis, and acute on chronic kidney injury. He was diagnosed with a DVT/PE and an IVC filter was placed. After IVC filter placement, he developed right hand weakness. MRI studies showed multiple infarcts consistent with an embolic event. The patient reportedly had a TEE demonstrating a PFO. Hypercoagulable studies were negative. He has developed a seizure disorder since his stroke, last seizure in January 2018.   He is here with his wife today. Overall doing fairly well. Complains of mild persistent expressive aphasia and gait instability. Tolerating hemodialysis relatively well but requires midodrine to maintain adequate BP. Denies chest pain, dyspnea, or edema.   Past Medical History:  Diagnosis Date  . A-fib ([place])   . Acute renal failure ([place]) 07/2011  . Anemia   . Asthma   . Diabetes mellitus without complication ([place])   . ESRD (end stage renal disease) on dialysis ([place])   . Gout   . History of recent blood transfusion 10/27/14   2 Units PRBC's  . Hyperkalemia 07/2011  . Hypertension   . Monoclonal gammopathies   . Monoclonal gammopathy   . OSA on CPAP   . Pulmonary embolism ([place]) 07/2011   a. Tx with Coumadin for 6 months (unknown cause per patient).  . Pulmonary embolism ([place])  07/2011; 09/27/2014   a. Bilat PE 07/2011 - unclear cause, tx with 6 months Coumadin.;   . Seizures ([place])   . Sickle cell-thalassemia disease ([place])    a. Sickle cell trait.  . Sleep apnea    a. uses CPAP.  [name] Stroke [facility])     Past Surgical History:  Procedure Laterality Date  . AV FISTULA PLACEMENT Right 12/05/2015   Procedure: INSERTION OF ARTERIOVENOUS (AV) GORE-TEX GRAFT ARM;  Surgeon: Elam Dutch, MD;  Location: [facility];  Service: Vascular;  Laterality: Right;  . BONE MARROW BIOPSY    . CHOLECYSTECTOMY    . CHOLECYSTECTOMY  1990's?  . EYE SURGERY Right   . INSERTION OF DIALYSIS CATHETER Right 10/16/2015   Procedure: INSERTION OF PALINDROME DIALYSIS CATHETER ;  Surgeon: Elam Dutch, MD;  Location: [facility];  Service: Vascular;  Laterality: Right;  . OTHER SURGICAL HISTORY     Retinal surgery  . PARS PLANA VITRECTOMY  02/17/2012   Procedure: PARS PLANA VITRECTOMY WITH 25 GAUGE;  Surgeon: Hayden Pedro, MD;  Location: [facility];  Service: Ophthalmology;  Laterality: Right;  . PERIPHERAL VASCULAR CATHETERIZATION N/A 03/20/2016   Procedure: A/V Shuntogram/Fistulagram;  Surgeon: Conrad Wilmington, MD;  Location: [facility];  Service: Cardiovascular;  Laterality: N/A;    Current Outpatient Prescriptions  Medication Sig Dispense Refill  . albuterol (PROAIR HFA) 108 (90 Base) MCG/ACT inhaler Inhale two puffs every 4-6 hours if needed for cough or wheeze 1 Inhaler 1  .  aspirin EC 81 MG tablet Take 1 tablet (81 mg total) by mouth daily. 100 tablet 0  . BAYER CONTOUR NEXT TEST test strip 1 each by Other route as directed.   99  . calcium acetate (PHOSLO) 667 MG capsule Take 3 capsules by mouth daily.    . Cetirizine HCl 10 MG CAPS Take 1 capsule by mouth daily.    . cinacalcet (SENSIPAR) 60 MG tablet Take 60 mg by mouth every evening.     . fluticasone furoate-vilanterol (BREO ELLIPTA) 100-25 MCG/INH AEPB Inhale one puff once daily to prevent cough or wheeze 60 each 5  . insulin glargine  (LANTUS) 100 unit/mL SOPN Inject 12 Units into the skin at bedtime.    . Lacosamide (VIMPAT) 150 MG TABS Take 1 tablet (150 mg total) by mouth 2 (two) times daily. 60 tablet 5  . lamoTRIgine (LAMICTAL) 150 MG tablet Take 1 tablet (150 mg total) by mouth 2 (two) times daily. 180 tablet 1  . metoprolol tartrate (LOPRESSOR) 25 MG tablet Take 0.5 tablets (12.5 mg total) by mouth 2 (two) times daily. 60 tablet 0  . midodrine (PROAMATINE) 2.5 MG tablet Take 2.5 mg by mouth daily.    . multivitamin (RENA-VIT) TABS tablet Take 1 tablet by mouth at bedtime. 30 tablet 1  . pantoprazole (PROTONIX) 40 MG tablet Take 1 tablet (40 mg total) by mouth 2 (two) times daily. 60 tablet 1   No current facility-administered medications for this visit.     Allergies:   Codeine   Social History:  The patient  reports that he has never smoked. He has never used smokeless tobacco. He reports that he does not drink alcohol or use drugs.   Family History:  The patient's  family history includes Diabetes in his mother; Diabetes type I in his brother; Hypertension in his mother; Kidney disease in his brother; Sickle cell anemia in his brother; Stroke in his father.    ROS:  Please see the history of present illness.  Otherwise, review of systems is positive for dizziness, gait difficulty.  All other systems are reviewed and negative.    PHYSICAL EXAM: VS:  BP 108/72   Pulse 70   Ht _0  (1.778 m)   Wt 215 lb 6.4 oz (97.7 kg)   BMI 30.91 kg/m  , BMI Body mass index is 30.91 kg/m. GEN: Well nourished, well developed, in no acute distress  HEENT: normal  Neck: no JVD, no masses. No carotid bruits Cardiac: RRR without murmur or gallop                Respiratory:  clear to auscultation bilaterally, normal work of breathing GI: soft, nontender, nondistended, + BS MS: no deformity or atrophy  Ext: no pretibial edema, pedal pulses 2+= bilaterally Skin: warm and dry, no rash Neuro:  4/5 strength right upper  extremity, mild expressive aphasia Psych: euthymic mood, full affect  EKG:  EKG is not ordered today. The ekg from 1/29 is reviewed and shows NSR 79 bpm  Recent Labs: 01/06/2016: TSH 0.896 07/14/2016: Magnesium 2.0 10/14/2016: ALT 12 11/13/2016: BUN 39; Creatinine, Ser 8.48; Hemoglobin 10.9; Platelets 399; Potassium 3.6; Sodium 135   Lipid Panel     Component Value Date/Time   CHOL 128 10/24/2015 1210   TRIG 88 10/24/2015 1210   HDL 48 10/24/2015 1210   CHOLHDL 2.7 10/24/2015 1210   VLDL 18 10/24/2015 1210   LDLCALC 62 10/24/2015 1210      Wt Readings from  Last 3 Encounters:  11/27/16 215 lb 6.4 oz (97.7 kg)  11/18/16 219 lb 9.6 oz (99.6 kg)  11/05/16 218 lb (98.9 kg)     Cardiac Studies Reviewed: 2D Echo 10-24-2015: Study Conclusions  - Left ventricle: The cavity size was normal. There was mild   concentric hypertrophy. Systolic function was mildly to   moderately reduced. The estimated ejection fraction was in the   range of 40% to 45%. Wall motion was normal; there were no   regional wall motion abnormalities. Doppler parameters are   consistent with abnormal left ventricular relaxation (grade 1   diastolic dysfunction). Doppler parameters are consistent with   elevated ventricular end-diastolic filling pressure. - Aortic valve: Trileaflet; normal thickness leaflets. - Mitral valve: Structurally normal valve. There was no   regurgitation. - Left atrium: The atrium was normal in size. - Right ventricle: The cavity size was normal. Wall thickness was   normal. Systolic function was normal. - Tricuspid valve: There was trivial regurgitation. - Pulmonary arteries: Systolic pressure was within the normal   range. - Inferior vena cava: The vessel was normal in size. - Pericardium, extracardiac: There was no pericardial effusion.  Impressions:  - There is mild global hypokinesis. LVEF 40-45%.   ASSESSMENT AND PLAN: 1.  PFO with stroke: the patient's complex  history is carefully reviewed. He had a stroke in the setting of DVT and venous procedure (IVC filter placement). Multiple emboli occurred based on MRI imaging. This clinical scenario is highly suggestive of a PFO-related stroke. A TEE has been done but images are not available. I have reviewed notes of Dr Acie Fredrickson and it appears he reviewed images approximately one year ago and felt another TEE study was necessary as the patient's PFO was not well seen.   The patient's medical history is carefully reviewed. Numerous hospital records are reviewed. I have also reviewed his 2D echo study which demonstrated mild LV systolic dysfunction without evidence of valvular disease. 'AFib' is listed in his history but I can find no record of this. It appears this was assumed because at one time the patient was on warfarin and had record of stroke. However, review of records again indicate that warfarin was used to treat DVT/PE and paradoxical embolism.   The patient is counseled about the association of PFO and cryptogenic stroke. Available clinical trial data is reviewed, specifically those trials comparing transcatheter PFO closure and medical therapy with antiplatelet drugs. The patient understands the potential benefit of PFO closure with respect to secondary stroke reduction compared with medical therapy alone. Specific risks of transcatheter PFO closure are reviewed with the patient. These risks include bleeding, infection, device embolization, stroke, cardiac perforation, tamponade, arrhythmia, MI, and late device erosion. He understands these serious risks occur at low incidence of < 1%.  I reviewed the risks, indications, and alternatives to transcatheter closure with the patient. Specific risks include bleeding, infection, device embolization, stroke, cardiac perforation, tamponade, arrhythmia, MI, and late device erosion. He understands these serious risks occur at low incidence of < 1%.   Prior to pursuing PFO  closure, I have recommended a TEE to better define his atrial septal anatomy and presence of PFO. Risks of TEE reviewed with the patient and his wife. They understand and agree to proceed.   Will discuss his case with Dr Michail Sermon as he has had GI bleeding. Will need to discuss whether he can take 3-6 months of clopidogrel +/- ASA 81 mg.   If PFO closure is performed,  will do IVC venography to make sure the IVC filter is clear of thrombus prior to passing equipment through the device. All of this is reviewed with the patient and wife. Will follow-up with him after I further discuss his case with Dr Michail Sermon and review the TEE.  Current medicines are reviewed with the patient today.  The patient does not have concerns regarding medicines.  Labs/ tests ordered today include:  No orders of the defined types were placed in this encounter.  Deatra James, MD  11/27/2016 10:08 AM    [facility] [address], [place], [place]  [address] Phone: [phone]; Fax: [phone]

## 2016-11-27 NOTE — Patient Instructions (Signed)
Medication Instructions:  Your physician recommends that you continue on your current medications as directed. Please refer to the Current Medication list given to you today.   Labwork: none  Testing/Procedures: Your physician has requested that you have a TEE. During a TEE, sound waves are used to create images of your heart. It provides your doctor with information about the size and shape of your heart and how well your heart's chambers and valves are working. In this test, a transducer is attached to the end of a flexible tube that's guided down your throat and into your esophagus (the tube leading from you mouth to your stomach) to get a more detailed image of your heart. You are not awake for the procedure. Please see the instruction sheet given to you today. For further information please visit [email or web address]. Scheduled for March 27,2018      Any Other Special Instructions Will Be Listed Below (If Applicable).     If you need a refill on your cardiac medications before your next appointment, please call your pharmacy.

## 2016-11-28 ENCOUNTER — Encounter (HOSPITAL_COMMUNITY): Payer: Self-pay

## 2016-11-28 DIAGNOSIS — N2581 Secondary hyperparathyroidism of renal origin: Secondary | ICD-10-CM | POA: Diagnosis not present

## 2016-11-28 DIAGNOSIS — E1129 Type 2 diabetes mellitus with other diabetic kidney complication: Secondary | ICD-10-CM | POA: Diagnosis not present

## 2016-11-28 DIAGNOSIS — D509 Iron deficiency anemia, unspecified: Secondary | ICD-10-CM | POA: Diagnosis not present

## 2016-11-28 DIAGNOSIS — D689 Coagulation defect, unspecified: Secondary | ICD-10-CM | POA: Diagnosis not present

## 2016-11-28 DIAGNOSIS — N186 End stage renal disease: Secondary | ICD-10-CM | POA: Diagnosis not present

## 2016-12-01 DIAGNOSIS — N186 End stage renal disease: Secondary | ICD-10-CM | POA: Diagnosis not present

## 2016-12-01 DIAGNOSIS — D631 Anemia in chronic kidney disease: Secondary | ICD-10-CM | POA: Diagnosis not present

## 2016-12-01 DIAGNOSIS — D689 Coagulation defect, unspecified: Secondary | ICD-10-CM | POA: Diagnosis not present

## 2016-12-01 DIAGNOSIS — D509 Iron deficiency anemia, unspecified: Secondary | ICD-10-CM | POA: Diagnosis not present

## 2016-12-01 DIAGNOSIS — N2581 Secondary hyperparathyroidism of renal origin: Secondary | ICD-10-CM | POA: Diagnosis not present

## 2016-12-01 DIAGNOSIS — E1129 Type 2 diabetes mellitus with other diabetic kidney complication: Secondary | ICD-10-CM | POA: Diagnosis not present

## 2016-12-01 DIAGNOSIS — Z7901 Long term (current) use of anticoagulants: Secondary | ICD-10-CM | POA: Diagnosis not present

## 2016-12-01 MED FILL — METOPROLOL TARTRATE 25 MG T: 25 | 90 days supply | Qty: 90 | Fill #2

## 2016-12-02 ENCOUNTER — Encounter (HOSPITAL_COMMUNITY)
Admission: RE | Disposition: A | Discharge status: Home / Self Care | Payer: Self-pay | Source: Ambulatory Visit | Attending: Cardiology

## 2016-12-02 ENCOUNTER — Ambulatory Visit (HOSPITAL_COMMUNITY)
Admission: RE | Admit: 2016-12-02 | Discharge: 2016-12-02 | Disposition: A | Discharge status: Home / Self Care | Payer: 59 | Source: Ambulatory Visit | Attending: Cardiology | Admitting: Cardiology

## 2016-12-02 ENCOUNTER — Ambulatory Visit (HOSPITAL_BASED_OUTPATIENT_CLINIC_OR_DEPARTMENT_OTHER): Payer: 59

## 2016-12-02 ENCOUNTER — Encounter (HOSPITAL_COMMUNITY): Payer: Self-pay

## 2016-12-02 DIAGNOSIS — Z86718 Personal history of other venous thrombosis and embolism: Secondary | ICD-10-CM | POA: Insufficient documentation

## 2016-12-02 DIAGNOSIS — Z9049 Acquired absence of other specified parts of digestive tract: Secondary | ICD-10-CM | POA: Diagnosis not present

## 2016-12-02 DIAGNOSIS — M109 Gout, unspecified: Secondary | ICD-10-CM | POA: Insufficient documentation

## 2016-12-02 DIAGNOSIS — I638 Other cerebral infarction: Secondary | ICD-10-CM | POA: Diagnosis not present

## 2016-12-02 DIAGNOSIS — Q2112 Patent foramen ovale: Secondary | ICD-10-CM

## 2016-12-02 DIAGNOSIS — D472 Monoclonal gammopathy: Secondary | ICD-10-CM | POA: Insufficient documentation

## 2016-12-02 DIAGNOSIS — E1122 Type 2 diabetes mellitus with diabetic chronic kidney disease: Secondary | ICD-10-CM | POA: Diagnosis not present

## 2016-12-02 DIAGNOSIS — Z833 Family history of diabetes mellitus: Secondary | ICD-10-CM | POA: Insufficient documentation

## 2016-12-02 DIAGNOSIS — Z992 Dependence on renal dialysis: Secondary | ICD-10-CM | POA: Insufficient documentation

## 2016-12-02 DIAGNOSIS — I120 Hypertensive chronic kidney disease with stage 5 chronic kidney disease or end stage renal disease: Secondary | ICD-10-CM | POA: Insufficient documentation

## 2016-12-02 DIAGNOSIS — Z832 Family history of diseases of the blood and blood-forming organs and certain disorders involving the immune mechanism: Secondary | ICD-10-CM | POA: Insufficient documentation

## 2016-12-02 DIAGNOSIS — Z794 Long term (current) use of insulin: Secondary | ICD-10-CM | POA: Insufficient documentation

## 2016-12-02 DIAGNOSIS — Z8673 Personal history of transient ischemic attack (TIA), and cerebral infarction without residual deficits: Secondary | ICD-10-CM | POA: Insufficient documentation

## 2016-12-02 DIAGNOSIS — I4891 Unspecified atrial fibrillation: Secondary | ICD-10-CM | POA: Insufficient documentation

## 2016-12-02 DIAGNOSIS — D649 Anemia, unspecified: Secondary | ICD-10-CM | POA: Diagnosis not present

## 2016-12-02 DIAGNOSIS — N186 End stage renal disease: Secondary | ICD-10-CM | POA: Diagnosis not present

## 2016-12-02 DIAGNOSIS — R269 Unspecified abnormalities of gait and mobility: Secondary | ICD-10-CM | POA: Insufficient documentation

## 2016-12-02 DIAGNOSIS — Q211 Atrial septal defect: Secondary | ICD-10-CM | POA: Diagnosis not present

## 2016-12-02 DIAGNOSIS — D5740 Sickle-cell thalassemia without crisis: Secondary | ICD-10-CM | POA: Insufficient documentation

## 2016-12-02 DIAGNOSIS — J45909 Unspecified asthma, uncomplicated: Secondary | ICD-10-CM | POA: Insufficient documentation

## 2016-12-02 DIAGNOSIS — E875 Hyperkalemia: Secondary | ICD-10-CM | POA: Diagnosis not present

## 2016-12-02 DIAGNOSIS — Z8249 Family history of ischemic heart disease and other diseases of the circulatory system: Secondary | ICD-10-CM | POA: Insufficient documentation

## 2016-12-02 DIAGNOSIS — Z7982 Long term (current) use of aspirin: Secondary | ICD-10-CM | POA: Diagnosis not present

## 2016-12-02 DIAGNOSIS — Z86711 Personal history of pulmonary embolism: Secondary | ICD-10-CM | POA: Insufficient documentation

## 2016-12-02 DIAGNOSIS — R4701 Aphasia: Secondary | ICD-10-CM | POA: Insufficient documentation

## 2016-12-02 DIAGNOSIS — Z823 Family history of stroke: Secondary | ICD-10-CM | POA: Insufficient documentation

## 2016-12-02 DIAGNOSIS — G4733 Obstructive sleep apnea (adult) (pediatric): Secondary | ICD-10-CM | POA: Insufficient documentation

## 2016-12-02 DIAGNOSIS — Z841 Family history of disorders of kidney and ureter: Secondary | ICD-10-CM | POA: Diagnosis not present

## 2016-12-02 HISTORY — PX: TEE WITHOUT CARDIOVERSION: SHX5443

## 2016-12-02 LAB — GLUCOSE, CAPILLARY: Glucose-Capillary: 129 mg/dL — ABNORMAL HIGH (ref 65–99)

## 2016-12-02 SURGERY — ECHOCARDIOGRAM, TRANSESOPHAGEAL
Anesthesia: Moderate Sedation

## 2016-12-02 MED ORDER — FENTANYL CITRATE (PF) 100 MCG/2ML IJ SOLN
INTRAMUSCULAR | Status: AC
  Filled 2016-12-02: qty 2

## 2016-12-02 MED ORDER — FENTANYL CITRATE (PF) 100 MCG/2ML IJ SOLN
INTRAMUSCULAR | Status: DC | PRN
  Administered 2016-12-02: 25 ug via INTRAVENOUS
  Administered 2016-12-02: 50 ug via INTRAVENOUS

## 2016-12-02 MED ORDER — MIDAZOLAM HCL 10 MG/2ML IJ SOLN
INTRAMUSCULAR | Status: DC | PRN
  Administered 2016-12-02 (×2): 2 mg via INTRAVENOUS

## 2016-12-02 MED ORDER — BUTAMBEN-TETRACAINE-BENZOCAINE 2-2-14 % EX AERO
INHALATION_SPRAY | CUTANEOUS | Status: DC | PRN
  Administered 2016-12-02: 2 via TOPICAL

## 2016-12-02 MED ORDER — MIDAZOLAM HCL 5 MG/ML IJ SOLN
INTRAMUSCULAR | Status: AC
Start: 2016-12-02 — End: 2016-12-02
  Filled 2016-12-02: qty 2

## 2016-12-02 MED ORDER — SODIUM CHLORIDE 0.9 % IV SOLN
INTRAVENOUS | Status: DC
Start: 2016-12-02 — End: 2016-12-02
  Administered 2016-12-02: 07:00:00 via INTRAVENOUS

## 2016-12-02 NOTE — CV Procedure (Signed)
Procedure: TEE  Indication: CVA, ?PFO  Sedation: Versed 4 mg IV, Fentanyl 75 mcg IV  Findings: Please see echo section for full report.  Normal LV size with low normal to mildly reduced systolic function, EF 09-79%.  No regional WMAs.  Normal RV size and systolic function.  Mild left atrial enlargement, small LA appendage but no thrombus noted.  Normal right atrium.  There was a small PFO noted by color and bubble study. Bubbles cross right to left with cough/valsalva.  Trivial TR.  No significant mitral regurgitation.  Trileaflet aortic valve with no stenosis or regurgitation.  Normal caliber aorta with minimal plaque.   Impression: PFO found.   Loralie Champagne 12/02/2016 8:28 AM

## 2016-12-02 NOTE — Interval H&P Note (Signed)
History and Physical Interval Note:  12/02/2016 8:08 AM  [place]  has presented today for surgery, with the diagnosis of PFO  The various methods of treatment have been discussed with the patient and family. After consideration of risks, benefits and other options for treatment, the patient has consented to  Procedure(s): TRANSESOPHAGEAL ECHOCARDIOGRAM (TEE) (N/A) as a surgical intervention .  The patient's history has been reviewed, patient examined, no change in status, stable for surgery.  I have reviewed the patient's chart and labs.  Questions were answered to the patient's satisfaction.     D[place] [employer]

## 2016-12-02 NOTE — Discharge Instructions (Signed)

## 2016-12-02 NOTE — H&P (View-Only) (Signed)
Cardiology Office Note Date:  11/27/2016   ID:  [name], [name] 18-Feb-1966, MRN [id number]  PCP:  Foye Spurling, MD  Cardiologist:  Sherren Mocha, MD   Requesting Physician: Dr Leonie Man  Chief Complaint  Patient presents with  . PFO consult     History of Present Illness: Isaiah [name] is a 51 y.o. male who presents for evaluation of PFO closure, referred by Dr Leonie Man.   The patient has a complex medical history that includes ESRD on intermittent hemodialysis and history of recurrent DVT/PE. He has undergone IVC filter placement and anticoagulation has been discontinued because of chronic GI bleeding.   The patient was hospitalized in [place] in January 2017 with acute GI bleeding, pancreatitis, and acute on chronic kidney injury. He was diagnosed with a DVT/PE and an IVC filter was placed. After IVC filter placement, he developed right hand weakness. MRI studies showed multiple infarcts consistent with an embolic event. The patient reportedly had a TEE demonstrating a PFO. Hypercoagulable studies were negative. He has developed a seizure disorder since his stroke, last seizure in January 2018.   He is here with his wife today. Overall doing fairly well. Complains of mild persistent expressive aphasia and gait instability. Tolerating hemodialysis relatively well but requires midodrine to maintain adequate BP. Denies chest pain, dyspnea, or edema.   Past Medical History:  Diagnosis Date  . A-fib ([place])   . Acute renal failure ([place]) 07/2011  . Anemia   . Asthma   . Diabetes mellitus without complication ([place])   . ESRD (end stage renal disease) on dialysis ([place])   . Gout   . History of recent blood transfusion 10/27/14   2 Units PRBC's  . Hyperkalemia 07/2011  . Hypertension   . Monoclonal gammopathies   . Monoclonal gammopathy   . OSA on CPAP   . Pulmonary embolism ([place]) 07/2011   a. Tx with Coumadin for 6 months (unknown cause per patient).  . Pulmonary embolism ([place])  07/2011; 09/27/2014   a. Bilat PE 07/2011 - unclear cause, tx with 6 months Coumadin.;   . Seizures ([place])   . Sickle cell-thalassemia disease ([place])    a. Sickle cell trait.  . Sleep apnea    a. uses CPAP.  [name] Stroke [facility])     Past Surgical History:  Procedure Laterality Date  . AV FISTULA PLACEMENT Right 12/05/2015   Procedure: INSERTION OF ARTERIOVENOUS (AV) GORE-TEX GRAFT ARM;  Surgeon: Elam Dutch, MD;  Location: [facility];  Service: Vascular;  Laterality: Right;  . BONE MARROW BIOPSY    . CHOLECYSTECTOMY    . CHOLECYSTECTOMY  1990's?  . EYE SURGERY Right   . INSERTION OF DIALYSIS CATHETER Right 10/16/2015   Procedure: INSERTION OF PALINDROME DIALYSIS CATHETER ;  Surgeon: Elam Dutch, MD;  Location: [facility];  Service: Vascular;  Laterality: Right;  . OTHER SURGICAL HISTORY     Retinal surgery  . PARS PLANA VITRECTOMY  02/17/2012   Procedure: PARS PLANA VITRECTOMY WITH 25 GAUGE;  Surgeon: Hayden Pedro, MD;  Location: [facility];  Service: Ophthalmology;  Laterality: Right;  . PERIPHERAL VASCULAR CATHETERIZATION N/A 03/20/2016   Procedure: A/V Shuntogram/Fistulagram;  Surgeon: Conrad Wilmington, MD;  Location: [facility];  Service: Cardiovascular;  Laterality: N/A;    Current Outpatient Prescriptions  Medication Sig Dispense Refill  . albuterol (PROAIR HFA) 108 (90 Base) MCG/ACT inhaler Inhale two puffs every 4-6 hours if needed for cough or wheeze 1 Inhaler 1  .  aspirin EC 81 MG tablet Take 1 tablet (81 mg total) by mouth daily. 100 tablet 0  . BAYER CONTOUR NEXT TEST test strip 1 each by Other route as directed.   99  . calcium acetate (PHOSLO) 667 MG capsule Take 3 capsules by mouth daily.    . Cetirizine HCl 10 MG CAPS Take 1 capsule by mouth daily.    . cinacalcet (SENSIPAR) 60 MG tablet Take 60 mg by mouth every evening.     . fluticasone furoate-vilanterol (BREO ELLIPTA) 100-25 MCG/INH AEPB Inhale one puff once daily to prevent cough or wheeze 60 each 5  . insulin glargine  (LANTUS) 100 unit/mL SOPN Inject 12 Units into the skin at bedtime.    . Lacosamide (VIMPAT) 150 MG TABS Take 1 tablet (150 mg total) by mouth 2 (two) times daily. 60 tablet 5  . lamoTRIgine (LAMICTAL) 150 MG tablet Take 1 tablet (150 mg total) by mouth 2 (two) times daily. 180 tablet 1  . metoprolol tartrate (LOPRESSOR) 25 MG tablet Take 0.5 tablets (12.5 mg total) by mouth 2 (two) times daily. 60 tablet 0  . midodrine (PROAMATINE) 2.5 MG tablet Take 2.5 mg by mouth daily.    . multivitamin (RENA-VIT) TABS tablet Take 1 tablet by mouth at bedtime. 30 tablet 1  . pantoprazole (PROTONIX) 40 MG tablet Take 1 tablet (40 mg total) by mouth 2 (two) times daily. 60 tablet 1   No current facility-administered medications for this visit.     Allergies:   Codeine   Social History:  The patient  reports that he has never smoked. He has never used smokeless tobacco. He reports that he does not drink alcohol or use drugs.   Family History:  The patient's  family history includes Diabetes in his mother; Diabetes type I in his brother; Hypertension in his mother; Kidney disease in his brother; Sickle cell anemia in his brother; Stroke in his father.    ROS:  Please see the history of present illness.  Otherwise, review of systems is positive for dizziness, gait difficulty.  All other systems are reviewed and negative.    PHYSICAL EXAM: VS:  BP 108/72   Pulse 70   Ht _0  (1.778 m)   Wt 215 lb 6.4 oz (97.7 kg)   BMI 30.91 kg/m  , BMI Body mass index is 30.91 kg/m. GEN: Well nourished, well developed, in no acute distress  HEENT: normal  Neck: no JVD, no masses. No carotid bruits Cardiac: RRR without murmur or gallop                Respiratory:  clear to auscultation bilaterally, normal work of breathing GI: soft, nontender, nondistended, + BS MS: no deformity or atrophy  Ext: no pretibial edema, pedal pulses 2+= bilaterally Skin: warm and dry, no rash Neuro:  4/5 strength right upper  extremity, mild expressive aphasia Psych: euthymic mood, full affect  EKG:  EKG is not ordered today. The ekg from 1/29 is reviewed and shows NSR 79 bpm  Recent Labs: 01/06/2016: TSH 0.896 07/14/2016: Magnesium 2.0 10/14/2016: ALT 12 11/13/2016: BUN 39; Creatinine, Ser 8.48; Hemoglobin 10.9; Platelets 399; Potassium 3.6; Sodium 135   Lipid Panel     Component Value Date/Time   CHOL 128 10/24/2015 1210   TRIG 88 10/24/2015 1210   HDL 48 10/24/2015 1210   CHOLHDL 2.7 10/24/2015 1210   VLDL 18 10/24/2015 1210   LDLCALC 62 10/24/2015 1210      Wt Readings from  Last 3 Encounters:  11/27/16 215 lb 6.4 oz (97.7 kg)  11/18/16 219 lb 9.6 oz (99.6 kg)  11/05/16 218 lb (98.9 kg)     Cardiac Studies Reviewed: 2D Echo 10-24-2015: Study Conclusions  - Left ventricle: The cavity size was normal. There was mild   concentric hypertrophy. Systolic function was mildly to   moderately reduced. The estimated ejection fraction was in the   range of 40% to 45%. Wall motion was normal; there were no   regional wall motion abnormalities. Doppler parameters are   consistent with abnormal left ventricular relaxation (grade 1   diastolic dysfunction). Doppler parameters are consistent with   elevated ventricular end-diastolic filling pressure. - Aortic valve: Trileaflet; normal thickness leaflets. - Mitral valve: Structurally normal valve. There was no   regurgitation. - Left atrium: The atrium was normal in size. - Right ventricle: The cavity size was normal. Wall thickness was   normal. Systolic function was normal. - Tricuspid valve: There was trivial regurgitation. - Pulmonary arteries: Systolic pressure was within the normal   range. - Inferior vena cava: The vessel was normal in size. - Pericardium, extracardiac: There was no pericardial effusion.  Impressions:  - There is mild global hypokinesis. LVEF 40-45%.   ASSESSMENT AND PLAN: 1.  PFO with stroke: the patient's complex  history is carefully reviewed. He had a stroke in the setting of DVT and venous procedure (IVC filter placement). Multiple emboli occurred based on MRI imaging. This clinical scenario is highly suggestive of a PFO-related stroke. A TEE has been done but images are not available. I have reviewed notes of Dr Acie Fredrickson and it appears he reviewed images approximately one year ago and felt another TEE study was necessary as the patient's PFO was not well seen.   The patient's medical history is carefully reviewed. Numerous hospital records are reviewed. I have also reviewed his 2D echo study which demonstrated mild LV systolic dysfunction without evidence of valvular disease. 'AFib' is listed in his history but I can find no record of this. It appears this was assumed because at one time the patient was on warfarin and had record of stroke. However, review of records again indicate that warfarin was used to treat DVT/PE and paradoxical embolism.   The patient is counseled about the association of PFO and cryptogenic stroke. Available clinical trial data is reviewed, specifically those trials comparing transcatheter PFO closure and medical therapy with antiplatelet drugs. The patient understands the potential benefit of PFO closure with respect to secondary stroke reduction compared with medical therapy alone. Specific risks of transcatheter PFO closure are reviewed with the patient. These risks include bleeding, infection, device embolization, stroke, cardiac perforation, tamponade, arrhythmia, MI, and late device erosion. He understands these serious risks occur at low incidence of < 1%.  I reviewed the risks, indications, and alternatives to transcatheter closure with the patient. Specific risks include bleeding, infection, device embolization, stroke, cardiac perforation, tamponade, arrhythmia, MI, and late device erosion. He understands these serious risks occur at low incidence of < 1%.   Prior to pursuing PFO  closure, I have recommended a TEE to better define his atrial septal anatomy and presence of PFO. Risks of TEE reviewed with the patient and his wife. They understand and agree to proceed.   Will discuss his case with Dr Michail Sermon as he has had GI bleeding. Will need to discuss whether he can take 3-6 months of clopidogrel +/- ASA 81 mg.   If PFO closure is performed,  will do IVC venography to make sure the IVC filter is clear of thrombus prior to passing equipment through the device. All of this is reviewed with the patient and wife. Will follow-up with him after I further discuss his case with Dr Michail Sermon and review the TEE.  Current medicines are reviewed with the patient today.  The patient does not have concerns regarding medicines.  Labs/ tests ordered today include:  No orders of the defined types were placed in this encounter.  Deatra James, MD  11/27/2016 10:08 AM    [facility] [address], [place], [place]  [address] Phone: [phone]; Fax: [phone]

## 2016-12-02 NOTE — Progress Notes (Deleted)
  Echocardiogram 2D Echocardiogram has been performed.  Isaiah [name] 12/02/2016, 8:33 AM

## 2016-12-03 ENCOUNTER — Encounter (HOSPITAL_COMMUNITY): Payer: Self-pay | Admitting: Cardiology

## 2016-12-03 DIAGNOSIS — D689 Coagulation defect, unspecified: Secondary | ICD-10-CM | POA: Diagnosis not present

## 2016-12-03 DIAGNOSIS — N186 End stage renal disease: Secondary | ICD-10-CM | POA: Diagnosis not present

## 2016-12-03 DIAGNOSIS — Z7901 Long term (current) use of anticoagulants: Secondary | ICD-10-CM | POA: Diagnosis not present

## 2016-12-03 DIAGNOSIS — D631 Anemia in chronic kidney disease: Secondary | ICD-10-CM | POA: Diagnosis not present

## 2016-12-03 DIAGNOSIS — N2581 Secondary hyperparathyroidism of renal origin: Secondary | ICD-10-CM | POA: Diagnosis not present

## 2016-12-03 DIAGNOSIS — E1129 Type 2 diabetes mellitus with other diabetic kidney complication: Secondary | ICD-10-CM | POA: Diagnosis not present

## 2016-12-03 DIAGNOSIS — D509 Iron deficiency anemia, unspecified: Secondary | ICD-10-CM | POA: Diagnosis not present

## 2016-12-05 DIAGNOSIS — D631 Anemia in chronic kidney disease: Secondary | ICD-10-CM | POA: Diagnosis not present

## 2016-12-05 DIAGNOSIS — N186 End stage renal disease: Secondary | ICD-10-CM | POA: Diagnosis not present

## 2016-12-05 DIAGNOSIS — D509 Iron deficiency anemia, unspecified: Secondary | ICD-10-CM | POA: Diagnosis not present

## 2016-12-05 DIAGNOSIS — E1129 Type 2 diabetes mellitus with other diabetic kidney complication: Secondary | ICD-10-CM | POA: Diagnosis not present

## 2016-12-05 DIAGNOSIS — Z7901 Long term (current) use of anticoagulants: Secondary | ICD-10-CM | POA: Diagnosis not present

## 2016-12-05 DIAGNOSIS — D689 Coagulation defect, unspecified: Secondary | ICD-10-CM | POA: Diagnosis not present

## 2016-12-05 DIAGNOSIS — N2581 Secondary hyperparathyroidism of renal origin: Secondary | ICD-10-CM | POA: Diagnosis not present

## 2016-12-06 DIAGNOSIS — Z992 Dependence on renal dialysis: Secondary | ICD-10-CM | POA: Diagnosis not present

## 2016-12-06 DIAGNOSIS — E1122 Type 2 diabetes mellitus with diabetic chronic kidney disease: Secondary | ICD-10-CM | POA: Diagnosis not present

## 2016-12-06 DIAGNOSIS — N186 End stage renal disease: Secondary | ICD-10-CM | POA: Diagnosis not present

## 2016-12-08 ENCOUNTER — Telehealth: Payer: Self-pay | Admitting: Cardiovascular Disease

## 2016-12-08 ENCOUNTER — Telehealth: Payer: Self-pay | Admitting: Oncology

## 2016-12-08 DIAGNOSIS — N2581 Secondary hyperparathyroidism of renal origin: Secondary | ICD-10-CM | POA: Diagnosis not present

## 2016-12-08 DIAGNOSIS — D689 Coagulation defect, unspecified: Secondary | ICD-10-CM | POA: Diagnosis not present

## 2016-12-08 DIAGNOSIS — E1129 Type 2 diabetes mellitus with other diabetic kidney complication: Secondary | ICD-10-CM | POA: Diagnosis not present

## 2016-12-08 DIAGNOSIS — D509 Iron deficiency anemia, unspecified: Secondary | ICD-10-CM | POA: Diagnosis not present

## 2016-12-08 DIAGNOSIS — N186 End stage renal disease: Secondary | ICD-10-CM | POA: Diagnosis not present

## 2016-12-08 NOTE — Telephone Encounter (Signed)
New Message  Pt wife call requesting to speak with Rn about pt ETT results. Please call back to discuss

## 2016-12-08 NOTE — Telephone Encounter (Signed)
I spoke with the pt's wife and made her aware that Dr Burt Knack is out of the office this week and TEE is available for his review. TEE does show small PFO. I advised that I will follow-up with the pt once Dr Burt Knack reviews TEE.

## 2016-12-08 NOTE — Telephone Encounter (Signed)
Called patient to inform him of next scheduled appointments.

## 2016-12-09 NOTE — Telephone Encounter (Signed)
Discussed case with Dr Michail Sermon to review whether patient could take ASA and plavix together. He is scheduled for colonoscopy in May (has to be done in hospital because of medical condition). Unable to do PFO closure until his GI workup is complete. In addition, PFO is small and need to decide whether to proceed with closure should be done regardless of GI findings. Please arrange office FU visit sometime after his colonoscopy in May/June. thanks

## 2016-12-10 DIAGNOSIS — D509 Iron deficiency anemia, unspecified: Secondary | ICD-10-CM | POA: Diagnosis not present

## 2016-12-10 DIAGNOSIS — D689 Coagulation defect, unspecified: Secondary | ICD-10-CM | POA: Diagnosis not present

## 2016-12-10 DIAGNOSIS — N186 End stage renal disease: Secondary | ICD-10-CM | POA: Diagnosis not present

## 2016-12-10 DIAGNOSIS — E1129 Type 2 diabetes mellitus with other diabetic kidney complication: Secondary | ICD-10-CM | POA: Diagnosis not present

## 2016-12-10 DIAGNOSIS — N2581 Secondary hyperparathyroidism of renal origin: Secondary | ICD-10-CM | POA: Diagnosis not present

## 2016-12-10 NOTE — Telephone Encounter (Signed)
Left message on machine for pt's wife to contact the office.

## 2016-12-12 DIAGNOSIS — N186 End stage renal disease: Secondary | ICD-10-CM | POA: Diagnosis not present

## 2016-12-12 DIAGNOSIS — D689 Coagulation defect, unspecified: Secondary | ICD-10-CM | POA: Diagnosis not present

## 2016-12-12 DIAGNOSIS — E1129 Type 2 diabetes mellitus with other diabetic kidney complication: Secondary | ICD-10-CM | POA: Diagnosis not present

## 2016-12-12 DIAGNOSIS — D509 Iron deficiency anemia, unspecified: Secondary | ICD-10-CM | POA: Diagnosis not present

## 2016-12-12 DIAGNOSIS — N2581 Secondary hyperparathyroidism of renal origin: Secondary | ICD-10-CM | POA: Diagnosis not present

## 2016-12-12 NOTE — Telephone Encounter (Signed)
I spoke with the pt's wife and made her aware of Dr Antionette Char comments.  The pt is scheduled for colonoscopy on 01/22/17.  I scheduled the pt for follow-up appointment with Dr Burt Knack on 02/05/17 at 9:40.

## 2016-12-12 NOTE — Telephone Encounter (Signed)
Follow U  Call:  Isaiah [name] is returning your call. Please call at [phone]

## 2016-12-14 MED FILL — BREO ELLIPTA 200-25 MCG INH: 200-25 | 30 days supply | Qty: 60 | Fill #1

## 2016-12-15 ENCOUNTER — Telehealth: Payer: Self-pay | Admitting: *Deleted

## 2016-12-15 DIAGNOSIS — D689 Coagulation defect, unspecified: Secondary | ICD-10-CM | POA: Diagnosis not present

## 2016-12-15 DIAGNOSIS — D631 Anemia in chronic kidney disease: Secondary | ICD-10-CM | POA: Diagnosis not present

## 2016-12-15 DIAGNOSIS — D509 Iron deficiency anemia, unspecified: Secondary | ICD-10-CM | POA: Diagnosis not present

## 2016-12-15 DIAGNOSIS — N2581 Secondary hyperparathyroidism of renal origin: Secondary | ICD-10-CM | POA: Diagnosis not present

## 2016-12-15 DIAGNOSIS — Z7901 Long term (current) use of anticoagulants: Secondary | ICD-10-CM | POA: Diagnosis not present

## 2016-12-15 DIAGNOSIS — E1129 Type 2 diabetes mellitus with other diabetic kidney complication: Secondary | ICD-10-CM | POA: Diagnosis not present

## 2016-12-15 DIAGNOSIS — N186 End stage renal disease: Secondary | ICD-10-CM | POA: Diagnosis not present

## 2016-12-15 MED ORDER — LACOSAMIDE 150 MG PO TABS: 150.0000 mg | ORAL_TABLET | Freq: Two times a day (BID) | ORAL | 5 refills | Status: DC

## 2016-12-15 NOTE — Telephone Encounter (Signed)
Vimpat Rx refill faxed in.

## 2016-12-16 MED FILL — VIMPAT 150 MG TABLET: 150 | 30 days supply | Qty: 60 | Fill #0

## 2016-12-17 DIAGNOSIS — D689 Coagulation defect, unspecified: Secondary | ICD-10-CM | POA: Diagnosis not present

## 2016-12-17 DIAGNOSIS — N186 End stage renal disease: Secondary | ICD-10-CM | POA: Diagnosis not present

## 2016-12-17 DIAGNOSIS — N2581 Secondary hyperparathyroidism of renal origin: Secondary | ICD-10-CM | POA: Diagnosis not present

## 2016-12-17 DIAGNOSIS — Z7901 Long term (current) use of anticoagulants: Secondary | ICD-10-CM | POA: Diagnosis not present

## 2016-12-17 DIAGNOSIS — D631 Anemia in chronic kidney disease: Secondary | ICD-10-CM | POA: Diagnosis not present

## 2016-12-17 DIAGNOSIS — E1129 Type 2 diabetes mellitus with other diabetic kidney complication: Secondary | ICD-10-CM | POA: Diagnosis not present

## 2016-12-17 DIAGNOSIS — D509 Iron deficiency anemia, unspecified: Secondary | ICD-10-CM | POA: Diagnosis not present

## 2016-12-19 DIAGNOSIS — Z7901 Long term (current) use of anticoagulants: Secondary | ICD-10-CM | POA: Diagnosis not present

## 2016-12-19 DIAGNOSIS — D631 Anemia in chronic kidney disease: Secondary | ICD-10-CM | POA: Diagnosis not present

## 2016-12-19 DIAGNOSIS — E1129 Type 2 diabetes mellitus with other diabetic kidney complication: Secondary | ICD-10-CM | POA: Diagnosis not present

## 2016-12-19 DIAGNOSIS — N2581 Secondary hyperparathyroidism of renal origin: Secondary | ICD-10-CM | POA: Diagnosis not present

## 2016-12-19 DIAGNOSIS — N186 End stage renal disease: Secondary | ICD-10-CM | POA: Diagnosis not present

## 2016-12-19 DIAGNOSIS — D509 Iron deficiency anemia, unspecified: Secondary | ICD-10-CM | POA: Diagnosis not present

## 2016-12-19 DIAGNOSIS — D689 Coagulation defect, unspecified: Secondary | ICD-10-CM | POA: Diagnosis not present

## 2016-12-22 DIAGNOSIS — N186 End stage renal disease: Secondary | ICD-10-CM | POA: Diagnosis not present

## 2016-12-22 DIAGNOSIS — D689 Coagulation defect, unspecified: Secondary | ICD-10-CM | POA: Diagnosis not present

## 2016-12-22 DIAGNOSIS — D509 Iron deficiency anemia, unspecified: Secondary | ICD-10-CM | POA: Diagnosis not present

## 2016-12-22 DIAGNOSIS — E1129 Type 2 diabetes mellitus with other diabetic kidney complication: Secondary | ICD-10-CM | POA: Diagnosis not present

## 2016-12-22 DIAGNOSIS — N2581 Secondary hyperparathyroidism of renal origin: Secondary | ICD-10-CM | POA: Diagnosis not present

## 2016-12-22 MED FILL — lamoTRIgine 150 MG TABS: 150 | 90 days supply | Qty: 180 | Fill #1

## 2016-12-24 DIAGNOSIS — D689 Coagulation defect, unspecified: Secondary | ICD-10-CM | POA: Diagnosis not present

## 2016-12-24 DIAGNOSIS — N2581 Secondary hyperparathyroidism of renal origin: Secondary | ICD-10-CM | POA: Diagnosis not present

## 2016-12-24 DIAGNOSIS — E1129 Type 2 diabetes mellitus with other diabetic kidney complication: Secondary | ICD-10-CM | POA: Diagnosis not present

## 2016-12-24 DIAGNOSIS — N186 End stage renal disease: Secondary | ICD-10-CM | POA: Diagnosis not present

## 2016-12-24 DIAGNOSIS — D509 Iron deficiency anemia, unspecified: Secondary | ICD-10-CM | POA: Diagnosis not present

## 2016-12-26 DIAGNOSIS — D509 Iron deficiency anemia, unspecified: Secondary | ICD-10-CM | POA: Diagnosis not present

## 2016-12-26 DIAGNOSIS — N2581 Secondary hyperparathyroidism of renal origin: Secondary | ICD-10-CM | POA: Diagnosis not present

## 2016-12-26 DIAGNOSIS — E1129 Type 2 diabetes mellitus with other diabetic kidney complication: Secondary | ICD-10-CM | POA: Diagnosis not present

## 2016-12-26 DIAGNOSIS — D689 Coagulation defect, unspecified: Secondary | ICD-10-CM | POA: Diagnosis not present

## 2016-12-26 DIAGNOSIS — N186 End stage renal disease: Secondary | ICD-10-CM | POA: Diagnosis not present

## 2016-12-26 MED FILL — MIDODRINE HCL 10 MG TABLET: 10 | 50 days supply | Qty: 100 | Fill #0

## 2016-12-29 DIAGNOSIS — N2581 Secondary hyperparathyroidism of renal origin: Secondary | ICD-10-CM | POA: Diagnosis not present

## 2016-12-29 DIAGNOSIS — N186 End stage renal disease: Secondary | ICD-10-CM | POA: Diagnosis not present

## 2016-12-29 DIAGNOSIS — D689 Coagulation defect, unspecified: Secondary | ICD-10-CM | POA: Diagnosis not present

## 2016-12-29 DIAGNOSIS — Z7901 Long term (current) use of anticoagulants: Secondary | ICD-10-CM | POA: Diagnosis not present

## 2016-12-29 DIAGNOSIS — D509 Iron deficiency anemia, unspecified: Secondary | ICD-10-CM | POA: Diagnosis not present

## 2016-12-29 DIAGNOSIS — E1129 Type 2 diabetes mellitus with other diabetic kidney complication: Secondary | ICD-10-CM | POA: Diagnosis not present

## 2016-12-31 ENCOUNTER — Telehealth: Payer: Self-pay | Admitting: Cardiovascular Disease

## 2016-12-31 DIAGNOSIS — E1129 Type 2 diabetes mellitus with other diabetic kidney complication: Secondary | ICD-10-CM | POA: Diagnosis not present

## 2016-12-31 DIAGNOSIS — N186 End stage renal disease: Secondary | ICD-10-CM | POA: Diagnosis not present

## 2016-12-31 DIAGNOSIS — N2581 Secondary hyperparathyroidism of renal origin: Secondary | ICD-10-CM | POA: Diagnosis not present

## 2016-12-31 DIAGNOSIS — D509 Iron deficiency anemia, unspecified: Secondary | ICD-10-CM | POA: Diagnosis not present

## 2016-12-31 DIAGNOSIS — Z7901 Long term (current) use of anticoagulants: Secondary | ICD-10-CM | POA: Diagnosis not present

## 2016-12-31 DIAGNOSIS — D689 Coagulation defect, unspecified: Secondary | ICD-10-CM | POA: Diagnosis not present

## 2016-12-31 NOTE — Telephone Encounter (Signed)
New Message     Pt wife is calling, needs fmla paperwork filled out its time to renew paperwork

## 2017-01-02 DIAGNOSIS — N2581 Secondary hyperparathyroidism of renal origin: Secondary | ICD-10-CM | POA: Diagnosis not present

## 2017-01-02 DIAGNOSIS — D509 Iron deficiency anemia, unspecified: Secondary | ICD-10-CM | POA: Diagnosis not present

## 2017-01-02 DIAGNOSIS — N186 End stage renal disease: Secondary | ICD-10-CM | POA: Diagnosis not present

## 2017-01-02 DIAGNOSIS — Z7901 Long term (current) use of anticoagulants: Secondary | ICD-10-CM | POA: Diagnosis not present

## 2017-01-02 DIAGNOSIS — D689 Coagulation defect, unspecified: Secondary | ICD-10-CM | POA: Diagnosis not present

## 2017-01-02 DIAGNOSIS — E1129 Type 2 diabetes mellitus with other diabetic kidney complication: Secondary | ICD-10-CM | POA: Diagnosis not present

## 2017-01-05 DIAGNOSIS — Z992 Dependence on renal dialysis: Secondary | ICD-10-CM | POA: Diagnosis not present

## 2017-01-05 DIAGNOSIS — N186 End stage renal disease: Secondary | ICD-10-CM | POA: Diagnosis not present

## 2017-01-05 DIAGNOSIS — D689 Coagulation defect, unspecified: Secondary | ICD-10-CM | POA: Diagnosis not present

## 2017-01-05 DIAGNOSIS — E1122 Type 2 diabetes mellitus with diabetic chronic kidney disease: Secondary | ICD-10-CM | POA: Diagnosis not present

## 2017-01-05 DIAGNOSIS — N2581 Secondary hyperparathyroidism of renal origin: Secondary | ICD-10-CM | POA: Diagnosis not present

## 2017-01-05 DIAGNOSIS — E1129 Type 2 diabetes mellitus with other diabetic kidney complication: Secondary | ICD-10-CM | POA: Diagnosis not present

## 2017-01-07 DIAGNOSIS — E1129 Type 2 diabetes mellitus with other diabetic kidney complication: Secondary | ICD-10-CM | POA: Diagnosis not present

## 2017-01-07 DIAGNOSIS — N2581 Secondary hyperparathyroidism of renal origin: Secondary | ICD-10-CM | POA: Diagnosis not present

## 2017-01-07 DIAGNOSIS — D689 Coagulation defect, unspecified: Secondary | ICD-10-CM | POA: Diagnosis not present

## 2017-01-07 DIAGNOSIS — N186 End stage renal disease: Secondary | ICD-10-CM | POA: Diagnosis not present

## 2017-01-07 DIAGNOSIS — D509 Iron deficiency anemia, unspecified: Secondary | ICD-10-CM | POA: Diagnosis not present

## 2017-01-08 MED FILL — CALCIUM ACETATE 667 MG CAP: 667 | 30 days supply | Qty: 180 | Fill #6

## 2017-01-08 MED FILL — GAVILYTE-N SOLUTION: 420 | 2 days supply | Qty: 8000 | Fill #0

## 2017-01-09 DIAGNOSIS — E1129 Type 2 diabetes mellitus with other diabetic kidney complication: Secondary | ICD-10-CM | POA: Diagnosis not present

## 2017-01-09 DIAGNOSIS — N2581 Secondary hyperparathyroidism of renal origin: Secondary | ICD-10-CM | POA: Diagnosis not present

## 2017-01-09 DIAGNOSIS — D689 Coagulation defect, unspecified: Secondary | ICD-10-CM | POA: Diagnosis not present

## 2017-01-09 DIAGNOSIS — N186 End stage renal disease: Secondary | ICD-10-CM | POA: Diagnosis not present

## 2017-01-09 DIAGNOSIS — D509 Iron deficiency anemia, unspecified: Secondary | ICD-10-CM | POA: Diagnosis not present

## 2017-01-12 DIAGNOSIS — N2581 Secondary hyperparathyroidism of renal origin: Secondary | ICD-10-CM | POA: Diagnosis not present

## 2017-01-12 DIAGNOSIS — D509 Iron deficiency anemia, unspecified: Secondary | ICD-10-CM | POA: Diagnosis not present

## 2017-01-12 DIAGNOSIS — N186 End stage renal disease: Secondary | ICD-10-CM | POA: Diagnosis not present

## 2017-01-12 DIAGNOSIS — D631 Anemia in chronic kidney disease: Secondary | ICD-10-CM | POA: Diagnosis not present

## 2017-01-12 DIAGNOSIS — E1129 Type 2 diabetes mellitus with other diabetic kidney complication: Secondary | ICD-10-CM | POA: Diagnosis not present

## 2017-01-12 DIAGNOSIS — D689 Coagulation defect, unspecified: Secondary | ICD-10-CM | POA: Diagnosis not present

## 2017-01-13 DIAGNOSIS — G40309 Generalized idiopathic epilepsy and epileptic syndromes, not intractable, without status epilepticus: Secondary | ICD-10-CM | POA: Diagnosis not present

## 2017-01-13 DIAGNOSIS — E1165 Type 2 diabetes mellitus with hyperglycemia: Secondary | ICD-10-CM | POA: Diagnosis not present

## 2017-01-13 DIAGNOSIS — I6789 Other cerebrovascular disease: Secondary | ICD-10-CM | POA: Diagnosis not present

## 2017-01-13 DIAGNOSIS — Z7901 Long term (current) use of anticoagulants: Secondary | ICD-10-CM | POA: Diagnosis not present

## 2017-01-13 MED FILL — MUPIROCIN 2% OINTMENT: 2 | 7 days supply | Qty: 22 | Fill #0

## 2017-01-13 MED FILL — LANTUS SOLOSTAR 100 UNITS/M: 100 | 60 days supply | Qty: 15 | Fill #1

## 2017-01-13 MED FILL — DOXYCYCLINE HYC 100 MG TAB: 100 | 9 days supply | Qty: 11 | Fill #0

## 2017-01-13 MED FILL — CONTOUR NEXT STRIPS: 33 days supply | Qty: 100 | Fill #8

## 2017-01-14 ENCOUNTER — Encounter: Payer: Self-pay | Admitting: Cardiovascular Disease

## 2017-01-14 DIAGNOSIS — D509 Iron deficiency anemia, unspecified: Secondary | ICD-10-CM | POA: Diagnosis not present

## 2017-01-14 DIAGNOSIS — N186 End stage renal disease: Secondary | ICD-10-CM | POA: Diagnosis not present

## 2017-01-14 DIAGNOSIS — D689 Coagulation defect, unspecified: Secondary | ICD-10-CM | POA: Diagnosis not present

## 2017-01-14 DIAGNOSIS — E1129 Type 2 diabetes mellitus with other diabetic kidney complication: Secondary | ICD-10-CM | POA: Diagnosis not present

## 2017-01-14 DIAGNOSIS — N2581 Secondary hyperparathyroidism of renal origin: Secondary | ICD-10-CM | POA: Diagnosis not present

## 2017-01-14 DIAGNOSIS — D631 Anemia in chronic kidney disease: Secondary | ICD-10-CM | POA: Diagnosis not present

## 2017-01-15 ENCOUNTER — Other Ambulatory Visit: Payer: Self-pay | Admitting: Gastroenterology

## 2017-01-16 DIAGNOSIS — D509 Iron deficiency anemia, unspecified: Secondary | ICD-10-CM | POA: Diagnosis not present

## 2017-01-16 DIAGNOSIS — N186 End stage renal disease: Secondary | ICD-10-CM | POA: Diagnosis not present

## 2017-01-16 DIAGNOSIS — D631 Anemia in chronic kidney disease: Secondary | ICD-10-CM | POA: Diagnosis not present

## 2017-01-16 DIAGNOSIS — E1129 Type 2 diabetes mellitus with other diabetic kidney complication: Secondary | ICD-10-CM | POA: Diagnosis not present

## 2017-01-16 DIAGNOSIS — D689 Coagulation defect, unspecified: Secondary | ICD-10-CM | POA: Diagnosis not present

## 2017-01-16 DIAGNOSIS — N2581 Secondary hyperparathyroidism of renal origin: Secondary | ICD-10-CM | POA: Diagnosis not present

## 2017-01-19 DIAGNOSIS — D689 Coagulation defect, unspecified: Secondary | ICD-10-CM | POA: Diagnosis not present

## 2017-01-19 DIAGNOSIS — N2581 Secondary hyperparathyroidism of renal origin: Secondary | ICD-10-CM | POA: Diagnosis not present

## 2017-01-19 DIAGNOSIS — D509 Iron deficiency anemia, unspecified: Secondary | ICD-10-CM | POA: Diagnosis not present

## 2017-01-19 DIAGNOSIS — E1129 Type 2 diabetes mellitus with other diabetic kidney complication: Secondary | ICD-10-CM | POA: Diagnosis not present

## 2017-01-19 DIAGNOSIS — N186 End stage renal disease: Secondary | ICD-10-CM | POA: Diagnosis not present

## 2017-01-19 MED FILL — VIMPAT 150 MG TABLET: 150 | 30 days supply | Qty: 60 | Fill #1

## 2017-01-20 ENCOUNTER — Ambulatory Visit: Payer: Medicare Other | Admitting: Occupational Therapy

## 2017-01-20 ENCOUNTER — Encounter (HOSPITAL_COMMUNITY): Payer: Self-pay

## 2017-01-21 DIAGNOSIS — D509 Iron deficiency anemia, unspecified: Secondary | ICD-10-CM | POA: Diagnosis not present

## 2017-01-21 DIAGNOSIS — D689 Coagulation defect, unspecified: Secondary | ICD-10-CM | POA: Diagnosis not present

## 2017-01-21 DIAGNOSIS — N186 End stage renal disease: Secondary | ICD-10-CM | POA: Diagnosis not present

## 2017-01-21 DIAGNOSIS — E1129 Type 2 diabetes mellitus with other diabetic kidney complication: Secondary | ICD-10-CM | POA: Diagnosis not present

## 2017-01-21 DIAGNOSIS — N2581 Secondary hyperparathyroidism of renal origin: Secondary | ICD-10-CM | POA: Diagnosis not present

## 2017-01-22 ENCOUNTER — Ambulatory Visit (HOSPITAL_COMMUNITY): Payer: 59 | Admitting: Anesthesiology

## 2017-01-22 ENCOUNTER — Encounter (HOSPITAL_COMMUNITY)
Admission: RE | Disposition: A | Discharge status: Home / Self Care | Payer: Self-pay | Source: Ambulatory Visit | Attending: Gastroenterology

## 2017-01-22 ENCOUNTER — Encounter (HOSPITAL_COMMUNITY): Payer: Self-pay | Admitting: Emergency Medicine

## 2017-01-22 ENCOUNTER — Ambulatory Visit (HOSPITAL_COMMUNITY)
Admission: RE | Admit: 2017-01-22 | Discharge: 2017-01-22 | Disposition: A | Discharge status: Home / Self Care | Payer: 59 | Source: Ambulatory Visit | Attending: Gastroenterology | Admitting: Gastroenterology

## 2017-01-22 DIAGNOSIS — K640 First degree hemorrhoids: Secondary | ICD-10-CM | POA: Diagnosis not present

## 2017-01-22 DIAGNOSIS — K562 Volvulus: Secondary | ICD-10-CM | POA: Insufficient documentation

## 2017-01-22 DIAGNOSIS — N186 End stage renal disease: Secondary | ICD-10-CM | POA: Diagnosis not present

## 2017-01-22 DIAGNOSIS — I509 Heart failure, unspecified: Secondary | ICD-10-CM | POA: Insufficient documentation

## 2017-01-22 DIAGNOSIS — D120 Benign neoplasm of cecum: Secondary | ICD-10-CM | POA: Diagnosis not present

## 2017-01-22 DIAGNOSIS — E119 Type 2 diabetes mellitus without complications: Secondary | ICD-10-CM | POA: Insufficient documentation

## 2017-01-22 DIAGNOSIS — D125 Benign neoplasm of sigmoid colon: Secondary | ICD-10-CM | POA: Diagnosis not present

## 2017-01-22 DIAGNOSIS — J45909 Unspecified asthma, uncomplicated: Secondary | ICD-10-CM | POA: Insufficient documentation

## 2017-01-22 DIAGNOSIS — G4730 Sleep apnea, unspecified: Secondary | ICD-10-CM | POA: Insufficient documentation

## 2017-01-22 DIAGNOSIS — K6389 Other specified diseases of intestine: Secondary | ICD-10-CM | POA: Diagnosis not present

## 2017-01-22 DIAGNOSIS — D509 Iron deficiency anemia, unspecified: Secondary | ICD-10-CM | POA: Diagnosis not present

## 2017-01-22 DIAGNOSIS — Z8673 Personal history of transient ischemic attack (TIA), and cerebral infarction without residual deficits: Secondary | ICD-10-CM | POA: Diagnosis not present

## 2017-01-22 DIAGNOSIS — I110 Hypertensive heart disease with heart failure: Secondary | ICD-10-CM | POA: Diagnosis not present

## 2017-01-22 HISTORY — PX: COLONOSCOPY WITH PROPOFOL: SHX5780

## 2017-01-22 HISTORY — DX: Iron deficiency anemia, unspecified: D50.9

## 2017-01-22 LAB — POCT I-STAT 4, (NA,K, GLUC, HGB,HCT)
Glucose, Bld: 73 mg/dL (ref 65–99)
HCT: 31 % — ABNORMAL LOW (ref 39.0–52.0)
Hemoglobin: 10.5 g/dL — ABNORMAL LOW (ref 13.0–17.0)
Potassium: 3.4 mmol/L — ABNORMAL LOW (ref 3.5–5.1)
Sodium: 135 mmol/L (ref 135–145)

## 2017-01-22 LAB — GLUCOSE, CAPILLARY
Glucose-Capillary: 81 mg/dL (ref 65–99)
Glucose-Capillary: 56 mg/dL — ABNORMAL LOW (ref 65–99)
Glucose-Capillary: 102 mg/dL — ABNORMAL HIGH (ref 65–99)
Glucose-Capillary: 65 mg/dL (ref 65–99)

## 2017-01-22 SURGERY — COLONOSCOPY WITH PROPOFOL
Anesthesia: Monitor Anesthesia Care

## 2017-01-22 MED ORDER — EPHEDRINE SULFATE-NACL 50-0.9 MG/10ML-% IV SOSY
PREFILLED_SYRINGE | INTRAVENOUS | Status: DC | PRN
  Administered 2017-01-22 (×2): 5 mg via INTRAVENOUS

## 2017-01-22 MED ORDER — SODIUM CHLORIDE 0.9 % IV SOLN
INTRAVENOUS | Status: DC
  Administered 2017-01-22: 10:00:00 via INTRAVENOUS

## 2017-01-22 MED ORDER — LIDOCAINE 2% (20 MG/ML) 5 ML SYRINGE
INTRAMUSCULAR | Status: DC | PRN
  Administered 2017-01-22: 40 mg via INTRAVENOUS

## 2017-01-22 MED ORDER — PROPOFOL 10 MG/ML IV BOLUS
INTRAVENOUS | Status: AC
  Filled 2017-01-22: qty 40

## 2017-01-22 MED ORDER — PROPOFOL 10 MG/ML IV BOLUS
INTRAVENOUS | Status: DC | PRN
Start: 2017-01-22 — End: 2017-01-22
  Administered 2017-01-22 (×9): 40 mg via INTRAVENOUS

## 2017-01-22 SURGICAL SUPPLY — 21 items

## 2017-01-22 NOTE — Anesthesia Preprocedure Evaluation (Addendum)
Anesthesia Evaluation  Patient identified by MRN, date of birth, ID band  Airway Mallampati: II       Dental no notable dental hx. (+) Dental Advisory Given, Chipped   Pulmonary asthma , sleep apnea ,    breath sounds clear to auscultation       Cardiovascular hypertension, +CHF   Rhythm:Regular Rate:Normal     Neuro/Psych Seizures -,  CVA    GI/Hepatic   Endo/Other  diabetes  Renal/GU ESRF and DialysisRenal disease     Musculoskeletal   Abdominal   Peds  Hematology   Anesthesia Other Findings   Reproductive/Obstetrics                           Anesthesia Physical Anesthesia Plan  ASA: III  Anesthesia Plan: MAC   Post-op Pain Management:    Induction: Intravenous  Airway Management Planned: Natural Airway and Nasal Cannula  Additional Equipment:   Intra-op Plan:   Post-operative Plan:   Informed Consent: I have reviewed the patients History and Physical, chart, labs and discussed the procedure including the risks, benefits and alternatives for the proposed anesthesia with the patient or authorized representative who has indicated his/her understanding and acceptance.   Dental advisory given  Plan Discussed with:   Anesthesia Plan Comments:         Anesthesia Quick Evaluation

## 2017-01-22 NOTE — Progress Notes (Signed)
Blood sugar rechecked.  65.  Patient given another pack of graham crackers and 4 ounces of orange juice.

## 2017-01-22 NOTE — Discharge Instructions (Addendum)
YOU HAD AN ENDOSCOPIC PROCEDURE TODAY: Refer to the procedure report and other information in the discharge instructions given to you for any specific questions about what was found during the examination. If this information does not answer your questions, please call [facility] office at [phone] to clarify.   YOU SHOULD EXPECT: Some feelings of bloating in the abdomen. Passage of more gas than usual. Walking can help get rid of the air that was put into your GI tract during the procedure and reduce the bloating. If you had a lower endoscopy (such as a colonoscopy or flexible sigmoidoscopy) you may notice spotting of blood in your stool or on the toilet paper. Some abdominal soreness may be present for a day or two, also.  DIET: Your first meal following the procedure should be a light meal and then it is ok to progress to your normal diet. A half-sandwich or bowl of soup is an example of a good first meal. Heavy or fried foods are harder to digest and may make you feel nauseous or bloated. Drink plenty of fluids but you should avoid alcoholic beverages for 24 hours. If you had a esophageal dilation, please see attached instructions for diet.   ACTIVITY: Your care partner should take you home directly after the procedure. You should plan to take it easy, moving slowly for the rest of the day. You can resume normal activity the day after the procedure however YOU SHOULD NOT DRIVE, use power tools, machinery or perform tasks that involve climbing or major physical exertion for 24 hours (because of the sedation medicines used during the test).   SYMPTOMS TO REPORT IMMEDIATELY: A gastroenterologist can be reached at any hour. Please call [phone]  for any of the following symptoms:  Following lower endoscopy (colonoscopy, flexible sigmoidoscopy) Excessive amounts of blood in the stool  Significant tenderness, worsening of abdominal pains  Swelling of the abdomen that is new, acute  Fever of 100 or  higher  Following upper endoscopy (EGD, EUS, ERCP, esophageal dilation) Vomiting of blood or coffee ground material  New, significant abdominal pain  New, significant chest pain or pain under the shoulder blades  Painful or persistently difficult swallowing  New shortness of breath  Black, tarry-looking or red, bloody stools  FOLLOW UP:  If any biopsies were taken you will be contacted by phone or by letter within the next 1-3 weeks. Call [phone]  if you have not heard about the biopsies in 3 weeks.  Please also call with any specific questions about appointments or follow up tests. Will call you when pathology results are complete.

## 2017-01-22 NOTE — Progress Notes (Signed)
Patient given half can of coke and pack of graham crackers for blood sugar.

## 2017-01-22 NOTE — Transfer of Care (Signed)
Immediate Anesthesia Transfer of Care Note  Patient: [place]  Procedure(s) Performed: Procedure(s): COLONOSCOPY WITH PROPOFOL (N/A)  Patient Location: PACU and Endoscopy Unit  Anesthesia Type:MAC  Level of Consciousness: sedated  Airway & Oxygen Therapy: Patient Spontanous Breathing and Patient connected to face mask oxygen  Post-op Assessment: Report given to RN and Post -op Vital signs reviewed and stable  Post vital signs: Reviewed and stable  Last Vitals:  Vitals:   01/22/17 0859  BP: 135/81  Pulse: 72  Resp: 20  Temp: 36.7 C    Last Pain:  Vitals:   01/22/17 0859  TempSrc: Oral         Complications: No apparent anesthesia complications

## 2017-01-22 NOTE — Anesthesia Postprocedure Evaluation (Signed)
Anesthesia Post Note  Patient: Isaiah [name]  Procedure(s) Performed: Procedure(s) (LRB): COLONOSCOPY WITH PROPOFOL (N/A)  Patient location during evaluation: Endoscopy Anesthesia Type: MAC Level of consciousness: awake and alert Pain management: pain level controlled Vital Signs Assessment: post-procedure vital signs reviewed and stable Respiratory status: spontaneous breathing, nonlabored ventilation, respiratory function stable and patient connected to nasal cannula oxygen Cardiovascular status: stable and blood pressure returned to baseline Anesthetic complications: no       Last Vitals:  Vitals:   01/22/17 0859 01/22/17 1054  BP: 135/81   Pulse: 72 74  Resp: 20 11  Temp: 36.7 C 36.7 C    Last Pain:  Vitals:   01/22/17 1054  TempSrc: Oral                 Armondo Cech,JAMES TERRILL

## 2017-01-22 NOTE — Op Note (Addendum)
[place] [name] Patient Name: Isaiah [name] [place] Procedure Date: 01/22/2017 MRN: [id number] Attending MD: [name] , MD Date of Birth: December 12, 1965 CSN: [id number] Age: 51 Admit Type: Inpatient Procedure:                Colonoscopy Indications:              Iron deficiency anemia Providers:                [name], MD, Carmie End, RN,                            Lillie Fragmin, RN, Cherylynn Ridges, Technician,                            Alan Mulder, Technician, Glenis Smoker, CRNA Referring MD:             Don Broach. Clark Medicines:                Propofol per Anesthesia, Monitored Anesthesia Care Complications:            No immediate complications. Estimated Blood Loss:     Estimated blood loss was minimal. Procedure:                Pre-Anesthesia Assessment:                           - Prior to the procedure, a History and Physical                            was performed, and patient medications and                            allergies were reviewed. The patient's tolerance of                            previous anesthesia was also reviewed. The risks                            and benefits of the procedure and the sedation                            options and risks were discussed with the patient.                            All questions were answered, and informed consent                            was obtained. Prior Anticoagulants: The patient has                            taken no previous anticoagulant or antiplatelet                            agents. ASA Grade Assessment: III - A patient with  severe systemic disease. After reviewing the risks                            and benefits, the patient was deemed in                            satisfactory condition to undergo the procedure.                           After obtaining informed consent, the colonoscope                            was passed under direct  vision. Throughout the                            procedure, the patient's blood pressure, pulse, and                            oxygen saturations were monitored continuously. The                            EC-[id number] ([id number]) scope was introduced through                            the anus and advanced to the the cecum, identified                            by appendiceal orifice and ileocecal valve. The                            colonoscopy was somewhat difficult due to a                            tortuous colon. Successful completion of the                            procedure was aided by straightening and shortening                            the scope to obtain bowel loop reduction. The                            patient tolerated the procedure well. The quality                            of the bowel preparation was fair and fair but                            repeated irrigation led to a good and adequate                            prep. The ileocecal valve, appendiceal orifice, and  rectum were photographed. Scope In: 10:20:20 AM Scope Out: 10:40:13 AM Scope Withdrawal Time: 0 hours 15 minutes 58 seconds  Total Procedure Duration: 0 hours 19 minutes 53 seconds  Findings:      The perianal and digital rectal examinations were normal.      Internal hemorrhoids were found during retroflexion. The hemorrhoids       were small and Grade I (internal hemorrhoids that do not prolapse).      Two semi-sessile polyps were found in the cecum. The polyps were 2 to 3       mm in size. These polyps were removed with a hot snare. Resection and       retrieval were complete. Estimated blood loss: none.      A 2 mm polyp was found in the cecum. The polyp was flat. The polyp was       removed with a cold biopsy forceps. Resection and retrieval were       complete. Estimated blood loss was minimal.      A 3 mm polyp was found in the sigmoid colon. The polyp was  semi-sessile.       The polyp was removed with a cold biopsy forceps. Resection and       retrieval were complete. Estimated blood loss was minimal. Impression:               - Preparation of the colon was fair.                           - Internal hemorrhoids.                           - Two 2 to 3 mm polyps in the cecum, removed with a                            hot snare. Resected and retrieved.                           - One 2 mm polyp in the cecum, removed with a cold                            biopsy forceps. Resected and retrieved.                           - One 3 mm polyp in the sigmoid colon, removed with                            a cold biopsy forceps. Resected and retrieved. Moderate Sedation:      N/A- Per Anesthesia Care Recommendation:           - Patient has a contact number available for                            emergencies. The signs and symptoms of potential                            delayed complications were discussed with the  patient. Return to normal activities tomorrow.                            Written discharge instructions were provided to the                            patient.                           - Resume previous diet.                           - Await pathology results.                           - Repeat colonoscopy for surveillance based on                            pathology results.                           - Continue present medications. Procedure Code(s):        --- Professional ---                           [phone], Colonoscopy, flexible; with removal of                            tumor(s), polyp(s), or other lesion(s) by snare                            technique                           45380, 42, Colonoscopy, flexible; with biopsy,                            single or multiple Diagnosis Code(s):        --- Professional ---                           D50.9, Iron deficiency anemia, unspecified                            D12.0, Benign neoplasm of cecum                           D12.5, Benign neoplasm of sigmoid colon                           K64.0, First degree hemorrhoids CPT copyright 2016 [employer]. All rights reserved. The codes documented in this report are preliminary and upon coder review may  be revised to meet current compliance requirements. [name], MD 01/22/2017 10:48:42 AM This report has been signed electronically. Number of Addenda: 0

## 2017-01-22 NOTE — H&P (Signed)
Date of Initial H&P: 01/14/17  History reviewed, patient examined, no change in status, stable for surgery.

## 2017-01-22 NOTE — Interval H&P Note (Signed)
History and Physical Interval Note:  01/22/2017 10:06 AM  [place]  has presented today for surgery, with the diagnosis of anemia  The various methods of treatment have been discussed with the patient and family. After consideration of risks, benefits and other options for treatment, the patient has consented to  Procedure(s): COLONOSCOPY WITH PROPOFOL (N/A) as a surgical intervention .  The patient's history has been reviewed, patient examined, no change in status, stable for surgery.  I have reviewed the patient's chart and labs.  Questions were answered to the patient's satisfaction.     [place] C.

## 2017-01-23 DIAGNOSIS — E1129 Type 2 diabetes mellitus with other diabetic kidney complication: Secondary | ICD-10-CM | POA: Diagnosis not present

## 2017-01-23 DIAGNOSIS — D689 Coagulation defect, unspecified: Secondary | ICD-10-CM | POA: Diagnosis not present

## 2017-01-23 DIAGNOSIS — N186 End stage renal disease: Secondary | ICD-10-CM | POA: Diagnosis not present

## 2017-01-23 DIAGNOSIS — D509 Iron deficiency anemia, unspecified: Secondary | ICD-10-CM | POA: Diagnosis not present

## 2017-01-23 DIAGNOSIS — N2581 Secondary hyperparathyroidism of renal origin: Secondary | ICD-10-CM | POA: Diagnosis not present

## 2017-01-26 DIAGNOSIS — N2581 Secondary hyperparathyroidism of renal origin: Secondary | ICD-10-CM | POA: Diagnosis not present

## 2017-01-26 DIAGNOSIS — N186 End stage renal disease: Secondary | ICD-10-CM | POA: Diagnosis not present

## 2017-01-26 DIAGNOSIS — D689 Coagulation defect, unspecified: Secondary | ICD-10-CM | POA: Diagnosis not present

## 2017-01-26 DIAGNOSIS — D509 Iron deficiency anemia, unspecified: Secondary | ICD-10-CM | POA: Diagnosis not present

## 2017-01-26 DIAGNOSIS — E1129 Type 2 diabetes mellitus with other diabetic kidney complication: Secondary | ICD-10-CM | POA: Diagnosis not present

## 2017-01-26 DIAGNOSIS — D631 Anemia in chronic kidney disease: Secondary | ICD-10-CM | POA: Diagnosis not present

## 2017-01-28 DIAGNOSIS — E1129 Type 2 diabetes mellitus with other diabetic kidney complication: Secondary | ICD-10-CM | POA: Diagnosis not present

## 2017-01-28 DIAGNOSIS — D509 Iron deficiency anemia, unspecified: Secondary | ICD-10-CM | POA: Diagnosis not present

## 2017-01-28 DIAGNOSIS — D631 Anemia in chronic kidney disease: Secondary | ICD-10-CM | POA: Diagnosis not present

## 2017-01-28 DIAGNOSIS — N186 End stage renal disease: Secondary | ICD-10-CM | POA: Diagnosis not present

## 2017-01-28 DIAGNOSIS — N2581 Secondary hyperparathyroidism of renal origin: Secondary | ICD-10-CM | POA: Diagnosis not present

## 2017-01-28 DIAGNOSIS — D689 Coagulation defect, unspecified: Secondary | ICD-10-CM | POA: Diagnosis not present

## 2017-01-29 ENCOUNTER — Ambulatory Visit (INDEPENDENT_AMBULATORY_CARE_PROVIDER_SITE_OTHER): Payer: 59 | Admitting: Nurse Practitioner

## 2017-01-29 ENCOUNTER — Ambulatory Visit (INDEPENDENT_AMBULATORY_CARE_PROVIDER_SITE_OTHER): Payer: 59 | Admitting: Ophthalmology

## 2017-01-29 ENCOUNTER — Encounter: Payer: Self-pay | Admitting: Nurse Practitioner

## 2017-01-29 VITALS — BP 124/77 | HR 61 | Wt 219.0 lb

## 2017-01-29 DIAGNOSIS — Z8673 Personal history of transient ischemic attack (TIA), and cerebral infarction without residual deficits: Secondary | ICD-10-CM | POA: Diagnosis not present

## 2017-01-29 DIAGNOSIS — I10 Essential (primary) hypertension: Secondary | ICD-10-CM | POA: Diagnosis not present

## 2017-01-29 DIAGNOSIS — R569 Unspecified convulsions: Secondary | ICD-10-CM | POA: Diagnosis not present

## 2017-01-29 DIAGNOSIS — H35033 Hypertensive retinopathy, bilateral: Secondary | ICD-10-CM

## 2017-01-29 DIAGNOSIS — H3523 Other non-diabetic proliferative retinopathy, bilateral: Secondary | ICD-10-CM

## 2017-01-29 DIAGNOSIS — H43812 Vitreous degeneration, left eye: Secondary | ICD-10-CM

## 2017-01-29 DIAGNOSIS — E1121 Type 2 diabetes mellitus with diabetic nephropathy: Secondary | ICD-10-CM | POA: Diagnosis not present

## 2017-01-29 DIAGNOSIS — I69351 Hemiplegia and hemiparesis following cerebral infarction affecting right dominant side: Secondary | ICD-10-CM | POA: Diagnosis not present

## 2017-01-29 DIAGNOSIS — R531 Weakness: Secondary | ICD-10-CM | POA: Diagnosis not present

## 2017-01-29 MED ORDER — LAMOTRIGINE 150 MG PO TABS: 150.0000 mg | ORAL_TABLET | Freq: Two times a day (BID) | ORAL | 1 refills | Status: DC

## 2017-01-29 NOTE — Progress Notes (Signed)
I have reviewed and agreed above plan. 

## 2017-01-29 NOTE — Patient Instructions (Signed)
Continue Vimpat 150 mg twice daily Continue lamotrigine 150 twice daily will refill Continue aspirin at present for stroke prevention Call for seizure activity  Patient has a follow-up with Dr. Burt Knack to discuss PFO closure Follow-up in 6 months

## 2017-01-29 NOTE — Progress Notes (Signed)
[facility]  PATIENT: Isaiah [name] DOB: November 01, 1965   REASON FOR VISIT: Follow-up for stroke, right hemiparesis, seizure disorder HISTORY FROM: Patient and wife    HISTORY OF PRESENT ILLNESS:HISTORY PS: [name] is a 51 year old African-American gentleman seen today for first office follow-up visit following hospital consultation with Dr. Erlinda Hong in Oct 23, 2015. He is accompanied by his wife. He has a complicated PMH of DM type 2 with diabetic neuropathy, Sickle cell-thalassemia,  DVT and PE x 2 in 07/2011 and 1/ 2016 off coumadin due to GIB, OSA, CKD stage V, MGUS and anemia .He was recently admitted to [facility] in [place] on 09/16/15 with N/V and blood stools with acute on chronic renal failure due to acute pancreatitis. He was treated with supportive care, IV bicarb as well as multiple units PRBC and EGD with severe stage IV erosive esophagitis with gastritis.HD initiated on 01/09 due to poor recover with decrease in UOP. He developed hypoxic respiratory failure and was found to have RUL/RML PE as well as left popliteal DVT. He was treated with IV heparin. IVC filter was placed around 09/21/15 and post procedure was found to have right hand weakness. CT head on 09/26/15 reported possible subacut right MCA/PCA and left cerebellar infarcts which were not correlate to his right hand weakness. Neurology was consulted for input and EMG showed evidence of profound neuropathy. He had worsening of symptoms with increase in right sided weakness, facial droop speech difficulty on 09/27/15.MRI on 09/29/15 showedscattered right MCA and left MCA, left thalamus and b/l cerebellar infarcts. The infarcts on CT 09/26/15 likely to be acute too. On 09/30/15, he had worsening of aphasia and repeat MRI showed extension of left MCA frontal infarcts. MRA head showed left M2 superior branch occlusion.Echo with positive bubble study and TEE positive for PFO.  Hypercoagulation studies reported to be negative. IV heparin was changed to Lovenox due to recurrent embolic strokes on heparin.  He was then transferred here to [facility] rehab on 10/12/15. He was reported to have small amount of blood in stool on admission. Continued HD for CKD after admission. Due to CKD on HD, his lovenox discontinued and put back on heparin IV. His hematologist Dr. Alen Blew was consulted for anticoagulation use. He commented "As far as long-term anticoagulation, I feel be reasonable to consider aspirin alone and hope that IVC filter will prevent any future embolic strokes from occurring. An alternative strategy is to consider warfarin and follow closely for signs or symptoms of bleeding." since admission, Patient made significant improvement on his aphasia and right side weakness, and has been able to walk with assistance and speak much more fluent. His H&H remained stable and no obvious bleeding reported. Dr. Erlinda Hong recommended starting warfarin. Patient has tolerated warfarin well without any more episodes of bleeding and hematocrit has remained stable. He is currently participating in outpatient physical, occupational and speech therapy and making good improvement. His able to states speaks short sentences now with some word hesitancy. He still has significant weakness in his right hand and grip but is able to walk with a cane. His balance is good is had no major falls. He does complain of pain and stiffness in his legs and hand particularly at night at times he has to wake up because of discomfort. He is being followed in the rehabilitation clinic but currently is not taking any medications for spasticity. His sugars have all been under good control. Update 03/25/2016 PS: He returns for  follow-up after last visit with me 3 months ago. He is accompanied today by his son. He was hospitalized on 4/312017 with a seizure. He was started on Vimpat 100 twice daily. EEG showed generalized slowing without  definite epileptiform activity. Limited MRI scan the brain was obtained on 12/3115 which showed multiple late subacute to chronic infarcts and her repeat MRI on 5/117 showed resolution and no definite new acute infarct. Patient was initially felt to be candidate for rehabilitation but after prolonged postictal phase for several days he made rapid improvement and was discharged home. Doing well. His able to walk without assistance. His 2 has mild expressive aphasia but speech is improving. Still has mild right-sided weakness. He walks with dragging his right leg. Is tolerating Vimpat well without side effects. He remains on warfarin and is tolerating it well without bleeding or bruising. He states his fasting sugars 7 well controlled. He has no new complaints. UPDATE 10/12/2017CM [name], 51 year old male returns for follow-up. With medical history of right and left  CVA in January 2017 end-stage renal disease previous pulmonary emboli, seizure disorder ,diabetes, hypertension previously on Coumadin. He was on vacation in [place] in September and admitted on 05/28/16 after a dialysis treatment he had a seizure event and was hospitalized he was also found to be anemic with a hemoglobin of 6.8 indicating positive stools in the emergency room he was admitted for complete workup. He received multiple blood transfusions.  He received a loading dose of Keppra in the emergency room and his Vimpat dose was increased to 150 mg twice daily. EEG performed was mildly abnormal due to the presence of mild intermittent generalized theta slowing which suggest mild encephalopathy of nonspecific etiology. He was hospital for 12 days and return to [place]. He denies further seizure events. He denies further stroke or TIA symptoms Continues  mild expressive aphasia and mild right-sided weakness. He returns for reevaluation.  UPDATE 11/16/2017CM [name], 51 year old male returns for follow-up he has a history of right and left  CVA in January 2017, end-stage renal disease on dialysis, seizure disorder, diabetes hypertension recent GI bleed while vacationing in [place] September 2017 after a dialysis treatment he had a seizure event and was hospitalized with hemoglobin of 6.8. His aspirin and Coumadin remain on hold. His GI doctor is Dr. Michail Sermon who he will see again in the next week or so. He is currently on Vimpat 150 twice daily. He had another seizure after dialysis on 07/14/2016 and was taken to the emergency room where he was placed on Dilantin 300 mg daily. Wife does not want him to be on Dilantin long term is wanting him to switch to a different medication. Fortunately he denies further stroke or TIA symptoms. He remains an speech and occupational therapy and he continues to have mild right hemiparesis and mild expressive aphasia. He returns to the office today for reevaluation  UPDATE 12/18/2017CM [name], 51 year old male returns for follow-up. He was seen in the emergency room on 08/20/16 for a seizure. He had an abscess of the right foreheadwhich  was I&Ded at that time. He remains on antibiotics. He is currently on Lamictal 50 in the morning and 100 at night along with Vimpat 150 twice daily. He is on Dilantin 300 however wife does not want him to be on medication long-term. His aspirin 0.81 has been restarted by  GI doctor,Dr. Michail Sermon. He has not had further stroke or TIA symptoms. His therapies have concluded. He remains with a  mild right hemiparesis and mild expressive aphasia. He returns for reevaluation Update 11/05/2016 ; he returns for follow-up after last visit 2 months ago. He had a breakthrough seizure on 10/06/16 he was seen in the [facility] emergency room. There is no obvious trigger noted. He was asked to continue his home medications. The patient had just tapered Dilantin and stopped it a few days later. The patient's wife is accompanying him appears quite frustrated with patient continuing to have  seizures. She in fact requested to transfer the patient's care to Dr. Jannifer Franklin for after I had a long discussion with the patient and his wife regarding incidence of breakthrough seizures and discussion of a little but treatment options date have decided to stay with me. I offered increasing the dose of lamotrigine but the patient is scared of having dizziness and possible side effects and would like to wait and continue the current dose for now. The patient has opted some improvement in his speech but can talk more fluently now though his speech is quite hesitant. Still has mild right hemiparesis and spasticity and has to walk slowly. He is had no falls or injuries. Remains on Vimpat 150 twice daily and lamotrigine 150 mg twice daily. The patient does complain of orthostasis and presyncopal symptoms during dialysis when he stands up. He admits his blood pressure tends to run quite low. He is taking midodrine every day as well. He is on aspirin 81 mg and tolerating them well. UPDATE 05/24/2018CM [name], 51 year old male returns for follow-up with history of seizure disorder and stroke. He has end-stage renal disease on dialysis. He also has history of GI bleed. Recent colonoscopy was negative for bleeding. He is to stay on aspirin for now but Dr. Burt Knack wants to switch him to Plavix. He also has an appointment to discuss PFO closure. Last seizure activity was January . He remains on Vimpat 150 twice daily and Lamictal 150 twice daily without side effects. He continues to have mild right hemiparesis and spasticity. He denies any recent falls or injuries he has continued to have improvement in speech although he can be hesitant at times. Blood pressure in the office today 124/77 He returns for reevaluation  REVIEW OF SYSTEMS: Full 14 system review of systems performed and notable only for those listed, all others are neg:  Constitutional: neg  Cardiovascular: neg Ear/Nose/Throat: neg  Skin: neg Eyes:  neg Respiratory: neg Gastroitestinal: History of GI bleed Hematology/Lymphatic: neg  Endocrine: neg Musculoskeletal:neg Allergy/Immunology: neg Neurological: Seizure disorder, history of stroke  Psychiatric: neg Sleep : neg   ALLERGIES: Allergies  Allergen Reactions  . Codeine Rash and Other (See Comments)    Unknown reaction (patient says it was more serious than just a rash, but he can't remember what happened)    HOME MEDICATIONS: Outpatient Medications Prior to Visit  Medication Sig Dispense Refill  . albuterol (PROAIR HFA) 108 (90 Base) MCG/ACT inhaler Inhale two puffs every 4-6 hours if needed for cough or wheeze 1 Inhaler 1  . aspirin EC 81 MG tablet Take 1 tablet (81 mg total) by mouth daily. 100 tablet 0  . BAYER CONTOUR NEXT TEST test strip 1 each by Other route as directed.   99  . calcium acetate (PHOSLO) 667 MG capsule Take 3 capsules by mouth 3 (three) times daily with meals.     . Cetirizine HCl 10 MG CAPS Take 1 capsule by mouth daily.    . cinacalcet (SENSIPAR) 60 MG tablet Take 60  mg by mouth every other day.     . fluticasone furoate-vilanterol (BREO ELLIPTA) 100-25 MCG/INH AEPB Inhale one puff once daily to prevent cough or wheeze 60 each 5  . insulin glargine (LANTUS) 100 unit/mL SOPN Inject 12 Units into the skin at bedtime.    . Lacosamide (VIMPAT) 150 MG TABS Take 1 tablet (150 mg total) by mouth 2 (two) times daily. 60 tablet 5  . lamoTRIgine (LAMICTAL) 150 MG tablet Take 1 tablet (150 mg total) by mouth 2 (two) times daily. 180 tablet 1  . metoprolol tartrate (LOPRESSOR) 25 MG tablet Take 0.5 tablets (12.5 mg total) by mouth 2 (two) times daily. 60 tablet 0  . midodrine (PROAMATINE) 10 MG tablet Take 10 mg by mouth 2 (two) times daily with a meal.     . multivitamin (RENA-VIT) TABS tablet Take 1 tablet by mouth at bedtime. 30 tablet 1  . pantoprazole (PROTONIX) 40 MG tablet Take 1 tablet (40 mg total) by mouth 2 (two) times daily. (Patient taking  differently: Take 40 mg by mouth every morning. ) 60 tablet 1   No facility-administered medications prior to visit.     PAST MEDICAL HISTORY: Past Medical History:  Diagnosis Date  . A-fib ([place])   . Acute renal failure ([place]) 07/2011  . Anemia   . Asthma   . Diabetes mellitus without complication ([place])   . ESRD (end stage renal disease) on dialysis ([place])   . Gout   . History of recent blood transfusion 10/27/14   2 Units PRBC's  . Hyperkalemia 07/2011  . Hypertension   . Monoclonal gammopathies   . Monoclonal gammopathy   . OSA on CPAP   . Pulmonary embolism ([place]) 07/2011   a. Tx with Coumadin for 6 months (unknown cause per patient).  . Pulmonary embolism ([place]) 07/2011; 09/27/2014   a. Bilat PE 07/2011 - unclear cause, tx with 6 months Coumadin.;   . Seizures ([place])   . Sickle cell-thalassemia disease ([place])    a. Sickle cell trait.  . Sleep apnea    a. uses CPAP.  [name] Stroke [facility])     PAST SURGICAL HISTORY: Past Surgical History:  Procedure Laterality Date  . AV FISTULA PLACEMENT Right 12/05/2015   Procedure: INSERTION OF ARTERIOVENOUS (AV) GORE-TEX GRAFT ARM;  Surgeon: Elam Dutch, MD;  Location: [place];  Service: Vascular;  Laterality: Right;  . BONE MARROW BIOPSY    . CHOLECYSTECTOMY    . CHOLECYSTECTOMY  1990's?  . COLONOSCOPY WITH PROPOFOL N/A 01/22/2017   Procedure: COLONOSCOPY WITH PROPOFOL;  Surgeon: Wilford Corner, MD;  Location: [facility];  Service: Endoscopy;  Laterality: N/A;  . EYE SURGERY Right   . INSERTION OF DIALYSIS CATHETER Right 10/16/2015   Procedure: INSERTION OF PALINDROME DIALYSIS CATHETER ;  Surgeon: Elam Dutch, MD;  Location: [place];  Service: Vascular;  Laterality: Right;  . OTHER SURGICAL HISTORY     Retinal surgery  . PARS PLANA VITRECTOMY  02/17/2012   Procedure: PARS PLANA VITRECTOMY WITH 25 GAUGE;  Surgeon: Hayden Pedro, MD;  Location: [place];  Service: Ophthalmology;  Laterality: Right;  . PERIPHERAL VASCULAR CATHETERIZATION  N/A 03/20/2016   Procedure: A/V Shuntogram/Fistulagram;  Surgeon: Conrad Hobart, MD;  Location: [facility];  Service: Cardiovascular;  Laterality: N/A;  . TEE WITHOUT CARDIOVERSION N/A 12/02/2016   Procedure: TRANSESOPHAGEAL ECHOCARDIOGRAM (TEE);  Surgeon: Larey Dresser, MD;  Location: [place];  Service: Cardiovascular;  Laterality: N/A;    FAMILY  HISTORY: Family History  Problem Relation Age of Onset  . Hypertension Mother   . Diabetes Mother   . Stroke Father   . Sickle cell anemia Brother   . Diabetes type I Brother   . Kidney disease Brother     SOCIAL HISTORY: Social History   Social History  . Marital status: Married    Spouse name: sylvia  . Number of children: 1  . Years of education: college   Occupational History  . [place] History Main Topics  . Smoking status: Never Smoker  . Smokeless tobacco: Never Used  . Alcohol use No  . Drug use: No  . Sexual activity: Yes   Other Topics Concern  . Not on file   Social History Narrative   ** Merged History Encounter **         PHYSICAL EXAM  Vitals:   01/29/17 1109  BP: 124/77  Pulse: 61  Weight: 219 lb (99.3 kg)   Body mass index is 31.42 kg/m.  Generalized: Well developed, Obese male in no acute distress  Head: normocephalic and atraumatic,. Oropharynx benign  Neck: Supple, no carotid bruits  Cardiac: Regular rate rhythm, no murmur  Musculoskeletal: No deformity Skin fistula right wrist area for dialysis   Neurological examination   Mentation: Alert oriented to time, place, history taking. Attention span and concentration appropriate. Recent and remote memory intact.  Mild expressive aphasia with word finding difficulties but able to speak in sentences with good comprehension    Cranial nerve II-XII: Pupils were equal round reactive to light extraocular movements were full, visual field were full on confrontational test. Facial sensation and strength were  normal. hearing was intact to finger rubbing bilaterally. Uvula tongue midline. head turning and shoulder shrug were normal and symmetric.Tongue protrusion into cheek strength was normal. Motor: Right spastic hemiparesis 4 out of 5 in the right upper and lower extremity  Sensory: normal and symmetric to light touch, pinprick, and  Vibration, on the left diminished on the right Coordination: finger-nose-finger, heel-to-shin bilaterally, impaired on the right and normal on the left Reflexes: Symmetric upper and lower, plantar responses were flexor bilaterally. Gait and Station: Rising up from seated position without assistance, mild spastic hemiparetic gait no assistive device no difficulty with turns DIAGNOSTIC DATA (LABS, IMAGING, TESTING) - I reviewed patient records, labs, notes, testing and imaging myself where available.  Lab Results  Component Value Date   WBC 13.1 (H) 11/13/2016   HGB 10.5 (L) 01/22/2017   HCT 31.0 (L) 01/22/2017   MCV 87.8 11/13/2016   PLT 399 11/13/2016      Component Value Date/Time   NA 135 01/22/2017 0929   NA 136 10/14/2016 0943   K 3.4 (L) 01/22/2017 0929   K 3.7 10/14/2016 0943   CL 98 (L) 11/13/2016 0925   CO2 29 11/13/2016 0925   CO2 27 10/14/2016 0943   GLUCOSE 73 01/22/2017 0929   GLUCOSE 101 10/14/2016 0943   BUN 39 (H) 11/13/2016 0925   BUN 36.2 (H) 10/14/2016 0943   CREATININE 8.48 (H) 11/13/2016 0925   CREATININE 9.0 (HH) 10/14/2016 0943   CALCIUM 9.3 11/13/2016 0925   CALCIUM 9.0 10/14/2016 0943   PROT 9.8 (H) 10/14/2016 0943   ALBUMIN 3.8 10/14/2016 0943   AST 15 10/14/2016 0943   ALT 12 10/14/2016 0943   ALKPHOS 96 10/14/2016 0943   BILITOT 0.93 10/14/2016 0943   GFRNONAA 6 (L) 11/13/2016 0925   GFRAA 7 (  L) 11/13/2016 0925   Lab Results  Component Value Date   CHOL 128 10/24/2015   HDL 48 10/24/2015   LDLCALC 62 10/24/2015   TRIG 88 10/24/2015   CHOLHDL 2.7 10/24/2015   Lab Results  Component Value Date   HGBA1C 4.4 (L)  01/06/2016   Lab Results  Component Value Date   DUKGURKY70 623 10/24/2015   Lab Results  Component Value Date   TSH 0.896 01/06/2016     ASSESSMENT AND PLAN 51 year old African-American male with bilateral anterior and posterior circulation embolic infarcts in January 2017 secondary to paradoxical embolism from deep vein thrombosis, pulmonary embolism and patent foramen ovale while he was off Coumadin due to GI bleeding with prolonged hospitalization in [facility] but was transferred here for rehabilitation. Repeat admission in April 2017 for seizure. He has residual expressive aphasia and spastic right hemiparesis which are improving . Vascular risk factors of diabetes, hypertension, sleep apnea. Repeat hospitalization in September 2017 [place] for seizure events after dialysis and GI bleed. Another seizure on 07/14/2016 after dialysis, seen in the emergency room and placed on Dilantin.Seen again in the ER on 08/20/16.  with seizure I&D of right forehead  for abscess. Another breakthrough seizure in January 2018. Other brief episodes of transient unresponsiveness following dialysis likely presyncopal events seizure events since last break through January. The patient is a current patient of Dr. Leonie Man  who is out of the office today . This note is sent to the work in doctor.     Continue Vimpat 150 mg twice daily Continue lamotrigine 150 twice daily will refill Continue aspirin at present for stroke prevention Call for seizure activity  Patient has a follow-up with Dr. Burt Knack to discuss PFO closure Follow-up in 6 months I spent 6mn  in total face to face time with the patient more than 50% of which was spent counseling and coordination of care, reviewing test results reviewing medications and discussing and reviewing the diagnosis of stroke and risk factor management along with seizure disorder and avoiding seizure triggers and review of medical record and further treatment  options. , NRayburn Ma [facility]Del[name] APRN  [facility] [address] [place] [phone]

## 2017-01-30 DIAGNOSIS — N2581 Secondary hyperparathyroidism of renal origin: Secondary | ICD-10-CM | POA: Diagnosis not present

## 2017-01-30 DIAGNOSIS — D631 Anemia in chronic kidney disease: Secondary | ICD-10-CM | POA: Diagnosis not present

## 2017-01-30 DIAGNOSIS — D689 Coagulation defect, unspecified: Secondary | ICD-10-CM | POA: Diagnosis not present

## 2017-01-30 DIAGNOSIS — N186 End stage renal disease: Secondary | ICD-10-CM | POA: Diagnosis not present

## 2017-01-30 DIAGNOSIS — D509 Iron deficiency anemia, unspecified: Secondary | ICD-10-CM | POA: Diagnosis not present

## 2017-01-30 DIAGNOSIS — E1129 Type 2 diabetes mellitus with other diabetic kidney complication: Secondary | ICD-10-CM | POA: Diagnosis not present

## 2017-02-02 DIAGNOSIS — D509 Iron deficiency anemia, unspecified: Secondary | ICD-10-CM | POA: Diagnosis not present

## 2017-02-02 DIAGNOSIS — D689 Coagulation defect, unspecified: Secondary | ICD-10-CM | POA: Diagnosis not present

## 2017-02-02 DIAGNOSIS — E1129 Type 2 diabetes mellitus with other diabetic kidney complication: Secondary | ICD-10-CM | POA: Diagnosis not present

## 2017-02-02 DIAGNOSIS — N186 End stage renal disease: Secondary | ICD-10-CM | POA: Diagnosis not present

## 2017-02-02 DIAGNOSIS — N2581 Secondary hyperparathyroidism of renal origin: Secondary | ICD-10-CM | POA: Diagnosis not present

## 2017-02-04 DIAGNOSIS — E1129 Type 2 diabetes mellitus with other diabetic kidney complication: Secondary | ICD-10-CM | POA: Diagnosis not present

## 2017-02-04 DIAGNOSIS — D509 Iron deficiency anemia, unspecified: Secondary | ICD-10-CM | POA: Diagnosis not present

## 2017-02-04 DIAGNOSIS — D689 Coagulation defect, unspecified: Secondary | ICD-10-CM | POA: Diagnosis not present

## 2017-02-04 DIAGNOSIS — N186 End stage renal disease: Secondary | ICD-10-CM | POA: Diagnosis not present

## 2017-02-04 DIAGNOSIS — N2581 Secondary hyperparathyroidism of renal origin: Secondary | ICD-10-CM | POA: Diagnosis not present

## 2017-02-04 MED FILL — PANTOPRAZOLE SOD DR 40 MG T: 40 | 90 days supply | Qty: 90 | Fill #0

## 2017-02-05 ENCOUNTER — Encounter: Payer: Self-pay | Admitting: Cardiovascular Disease

## 2017-02-05 ENCOUNTER — Ambulatory Visit (INDEPENDENT_AMBULATORY_CARE_PROVIDER_SITE_OTHER): Payer: 59 | Admitting: Cardiovascular Disease

## 2017-02-05 VITALS — BP 100/60 | HR 70 | Ht 70.0 in | Wt 217.8 lb

## 2017-02-05 DIAGNOSIS — N186 End stage renal disease: Secondary | ICD-10-CM | POA: Diagnosis not present

## 2017-02-05 DIAGNOSIS — Q2112 Patent foramen ovale: Secondary | ICD-10-CM

## 2017-02-05 DIAGNOSIS — Q211 Atrial septal defect: Secondary | ICD-10-CM

## 2017-02-05 DIAGNOSIS — Z992 Dependence on renal dialysis: Secondary | ICD-10-CM | POA: Diagnosis not present

## 2017-02-05 DIAGNOSIS — I513 Intracardiac thrombosis, not elsewhere classified: Secondary | ICD-10-CM

## 2017-02-05 DIAGNOSIS — E1122 Type 2 diabetes mellitus with diabetic chronic kidney disease: Secondary | ICD-10-CM | POA: Diagnosis not present

## 2017-02-05 NOTE — Progress Notes (Signed)
Cardiology Office Note Date:  02/05/2017   ID:  [place], [name] 1966-02-15, MRN [id number]  PCP:  Foye Spurling, MD  Cardiologist:  Sherren Mocha, MD    Chief Complaint  Patient presents with  . Follow-up    PFO     History of Present Illness: Isaiah [name] is a 51 y.o. male who presents for follow-up of PFO. The patient has a complex medical history that includes end-stage renal disease and history of DVT/PE. The patient underwent IVC filter placement in 2012 and anticoagulation was later discontinued because of chronic GI blood loss. He was seen in March 2018 for consideration of PFO closure after he had a stroke in January 2017 in the setting of an acute DVT/PE while traveling in [place]. At the time he was hospitalized with multiple problems including GI bleeding, pancreatitis, and acute kidney injury.  The patient is here with his wife today. He has undergone a TEE demonstrating a small PFO with associated small shunt occurring with cough/Valsalva. He reports no intercurrent medical problems since his last evaluation and specifically denies chest pain, shortness of breath, lightheadedness, or heart palpitations.   Past Medical History:  Diagnosis Date  . A-fib ([place])   . Acute renal failure ([place]) 07/2011  . Anemia   . Asthma   . Diabetes mellitus without complication ([place])   . ESRD (end stage renal disease) on dialysis ([place])   . Gout   . History of recent blood transfusion 10/27/14   2 Units PRBC's  . Hyperkalemia 07/2011  . Hypertension   . Monoclonal gammopathies   . Monoclonal gammopathy   . OSA on CPAP   . Pulmonary embolism ([place]) 07/2011   a. Tx with Coumadin for 6 months (unknown cause per patient).  . Pulmonary embolism ([place]) 07/2011; 09/27/2014   a. Bilat PE 07/2011 - unclear cause, tx with 6 months Coumadin.;   . Seizures ([place])   . Sickle cell-thalassemia disease ([place])    a. Sickle cell trait.  . Sleep apnea    a. uses CPAP.  [name] Stroke [facility])      Past Surgical History:  Procedure Laterality Date  . AV FISTULA PLACEMENT Right 12/05/2015   Procedure: INSERTION OF ARTERIOVENOUS (AV) GORE-TEX GRAFT ARM;  Surgeon: Elam Dutch, MD;  Location: [facility];  Service: Vascular;  Laterality: Right;  . BONE MARROW BIOPSY    . CHOLECYSTECTOMY    . CHOLECYSTECTOMY  1990's?  . COLONOSCOPY WITH PROPOFOL N/A 01/22/2017   Procedure: COLONOSCOPY WITH PROPOFOL;  Surgeon: Wilford Corner, MD;  Location: [facility];  Service: Endoscopy;  Laterality: N/A;  . EYE SURGERY Right   . INSERTION OF DIALYSIS CATHETER Right 10/16/2015   Procedure: INSERTION OF PALINDROME DIALYSIS CATHETER ;  Surgeon: Elam Dutch, MD;  Location: [place] [facility];  Service: Vascular;  Laterality: Right;  . OTHER SURGICAL HISTORY     Retinal surgery  . PARS PLANA VITRECTOMY  02/17/2012   Procedure: PARS PLANA VITRECTOMY WITH 25 GAUGE;  Surgeon: Hayden Pedro, MD;  Location: [place];  Service: Ophthalmology;  Laterality: Right;  . PERIPHERAL VASCULAR CATHETERIZATION N/A 03/20/2016   Procedure: A/V Shuntogram/Fistulagram;  Surgeon: Conrad , MD;  Location: [facility] CV [facility];  Service: Cardiovascular;  Laterality: N/A;  . TEE WITHOUT CARDIOVERSION N/A 12/02/2016   Procedure: TRANSESOPHAGEAL ECHOCARDIOGRAM (TEE);  Surgeon: Larey Dresser, MD;  Location: [facility];  Service: Cardiovascular;  Laterality: N/A;    Current Outpatient Prescriptions  Medication Sig Dispense  Refill  . albuterol (PROAIR HFA) 108 (90 Base) MCG/ACT inhaler Inhale two puffs every 4-6 hours if needed for cough or wheeze 1 Inhaler 1  . aspirin EC 81 MG tablet Take 1 tablet (81 mg total) by mouth daily. 100 tablet 0  . BAYER CONTOUR NEXT TEST test strip 1 each by Other route as directed.   99  . calcium acetate (PHOSLO) 667 MG capsule Take 3 capsules by mouth 3 (three) times daily with meals.     . Cetirizine HCl 10 MG CAPS Take 1 capsule by mouth daily.    . cinacalcet (SENSIPAR) 60 MG tablet Take 60 mg by  mouth every other day.     . fluticasone furoate-vilanterol (BREO ELLIPTA) 100-25 MCG/INH AEPB Inhale one puff once daily to prevent cough or wheeze 60 each 5  . insulin glargine (LANTUS) 100 unit/mL SOPN Inject 12 Units into the skin at bedtime.    . Lacosamide (VIMPAT) 150 MG TABS Take 1 tablet (150 mg total) by mouth 2 (two) times daily. 60 tablet 5  . lamoTRIgine (LAMICTAL) 150 MG tablet Take 1 tablet (150 mg total) by mouth 2 (two) times daily. 180 tablet 1  . metoprolol tartrate (LOPRESSOR) 25 MG tablet Take 0.5 tablets (12.5 mg total) by mouth 2 (two) times daily. 60 tablet 0  . midodrine (PROAMATINE) 10 MG tablet Take 10 mg by mouth 2 (two) times daily with a meal.     . multivitamin (RENA-VIT) TABS tablet Take 1 tablet by mouth at bedtime. 30 tablet 1  . pantoprazole (PROTONIX) 40 MG tablet Take 1 tablet (40 mg total) by mouth 2 (two) times daily. 60 tablet 1   No current facility-administered medications for this visit.     Allergies:   Codeine   Social History:  The patient  reports that he has never smoked. He has never used smokeless tobacco. He reports that he does not drink alcohol or use drugs.   Family History:  The patient's  family history includes Diabetes in his mother; Diabetes type I in his brother; Hypertension in his mother; Kidney disease in his brother; Sickle cell anemia in his brother; Stroke in his father.    ROS:  Please see the history of present illness.  Otherwise, review of systems is positive for fatigue.  All other systems are reviewed and negative.    PHYSICAL EXAM: VS:  BP 100/60   Pulse 70   Ht 5' 10"  (1.778 m)   Wt 217 lb 12.8 oz (98.8 kg)   SpO2 96%   BMI 31.25 kg/m  , BMI Body mass index is 31.25 kg/m. GEN: Well nourished, well developed, in no acute distress  HEENT: normal  Neck: no JVD, no masses. No carotid bruits Cardiac: RRR without murmur or gallop                Respiratory:  clear to auscultation bilaterally, normal work of  breathing GI: soft, nontender, nondistended, + BS MS: no deformity or atrophy  Ext: no pretibial edema, pedal pulses 2+= bilaterally Skin: warm and dry, no rash Psych: euthymic mood, full affect  EKG:  EKG is not ordered today.  Recent Labs: 07/14/2016: Magnesium 2.0 10/14/2016: ALT 12 11/13/2016: BUN 39; Creatinine, Ser 8.48; Platelets 399 01/22/2017: Hemoglobin 10.5; Potassium 3.4; Sodium 135   Lipid Panel     Component Value Date/Time   CHOL 128 10/24/2015 1210   TRIG 88 10/24/2015 1210   HDL 48 10/24/2015 1210   CHOLHDL 2.7 10/24/2015  1210   VLDL 18 10/24/2015 1210   LDLCALC 62 10/24/2015 1210      Wt Readings from Last 3 Encounters:  02/05/17 217 lb 12.8 oz (98.8 kg)  01/29/17 219 lb (99.3 kg)  01/22/17 217 lb (98.4 kg)     Cardiac Studies Reviewed: TEE: Study Conclusions  - Left ventricle: Normal LV size with low normal to mildly reduced   systolic function, EF 96-11%. No regional WMAs. - Aortic valve: There was no stenosis. - Aorta: Normal caliber aorta with minimal plaque. - Mitral valve: There was no significant regurgitation. - Left atrium: The atrium was mildly dilated. No evidence of   thrombus in the atrial cavity or appendage. - Right ventricle: The cavity size was normal. Systolic function   was normal. - Right atrium: No evidence of thrombus in the atrial cavity or   appendage. - Atrial septum: There was a small PFO noted by color and bubble   study. Bubbles cross right to left with cough/valsalva.  Impressions:  - Small PFO found.  ASSESSMENT AND PLAN: PFO: The patient had a stroke in the setting of DVT/PE. He was initially referred for consideration of PFO closure and he underwent transesophageal echo 12/02/2016. I have personally reviewed these images. The patient has a very small PFO with few bubbles across during cough/Valsalva. While it is probably technically feasible to close the PFO, I do not think there would be a significant benefit  based on the anatomic features of this small defect. In addition, the patient has an IVC filter that would have to be crossed with both an intracardiac echo probe and the delivery sheath. Again this is not prohibitive, but makes the risk/benefit of the procedure less favorable. I'm inclined to continue his current medical therapy and see him back for follow-up in one year. If closing his PFO would make a difference in his candidacy for renal transplant, and I certainly would consider doing a closure procedure. However, I would guess this would not impact whether he can receive a renal transplant based on his other comorbid conditions. I will discuss this with his primary nephrologist, Dr. Jimmy Footman.  Current medicines are reviewed with the patient today.  The patient does not have concerns regarding medicines.  Labs/ tests ordered today include:  No orders of the defined types were placed in this encounter.  Disposition:   FU one year  Signed, Sherren Mocha, MD  02/05/2017 12:50 PM    [address], [place], [place]  [address] Phone: [phone]; Fax: [phone]

## 2017-02-05 NOTE — Patient Instructions (Signed)
Medication Instructions:  Your physician recommends that you continue on your current medications as directed. Please refer to the Current Medication list given to you today.  Labwork: No new orders.   Testing/Procedures: No new orders.   Follow-Up: Your physician wants you to follow-up in: 1 YEAR with Dr Cooper.  You will receive a reminder letter in the mail two months in advance. If you don't receive a letter, please call our office to schedule the follow-up appointment.   Any Other Special Instructions Will Be Listed Below (If Applicable).     If you need a refill on your cardiac medications before your next appointment, please call your pharmacy.    

## 2017-02-06 DIAGNOSIS — E1129 Type 2 diabetes mellitus with other diabetic kidney complication: Secondary | ICD-10-CM | POA: Diagnosis not present

## 2017-02-06 DIAGNOSIS — N2581 Secondary hyperparathyroidism of renal origin: Secondary | ICD-10-CM | POA: Diagnosis not present

## 2017-02-06 DIAGNOSIS — D509 Iron deficiency anemia, unspecified: Secondary | ICD-10-CM | POA: Diagnosis not present

## 2017-02-06 DIAGNOSIS — N186 End stage renal disease: Secondary | ICD-10-CM | POA: Diagnosis not present

## 2017-02-06 DIAGNOSIS — D689 Coagulation defect, unspecified: Secondary | ICD-10-CM | POA: Diagnosis not present

## 2017-02-06 MED FILL — MIDODRINE HCL 10 MG TABLET: 10 | 35 days supply | Qty: 70 | Fill #1

## 2017-02-09 DIAGNOSIS — D631 Anemia in chronic kidney disease: Secondary | ICD-10-CM | POA: Diagnosis not present

## 2017-02-09 DIAGNOSIS — E1129 Type 2 diabetes mellitus with other diabetic kidney complication: Secondary | ICD-10-CM | POA: Diagnosis not present

## 2017-02-09 DIAGNOSIS — D689 Coagulation defect, unspecified: Secondary | ICD-10-CM | POA: Diagnosis not present

## 2017-02-09 DIAGNOSIS — N2581 Secondary hyperparathyroidism of renal origin: Secondary | ICD-10-CM | POA: Diagnosis not present

## 2017-02-09 DIAGNOSIS — N186 End stage renal disease: Secondary | ICD-10-CM | POA: Diagnosis not present

## 2017-02-09 DIAGNOSIS — D509 Iron deficiency anemia, unspecified: Secondary | ICD-10-CM | POA: Diagnosis not present

## 2017-02-10 MED FILL — MUPIROCIN 2% OINTMENT: 2 | 7 days supply | Qty: 22 | Fill #1

## 2017-02-10 MED FILL — BREO ELLIPTA 200-25 MCG INH: 200-25 | 30 days supply | Qty: 60 | Fill #2

## 2017-02-10 MED FILL — CALCIUM ACETATE 667 MG CAP: 667 | 30 days supply | Qty: 180 | Fill #7

## 2017-02-11 DIAGNOSIS — N186 End stage renal disease: Secondary | ICD-10-CM | POA: Diagnosis not present

## 2017-02-11 DIAGNOSIS — D631 Anemia in chronic kidney disease: Secondary | ICD-10-CM | POA: Diagnosis not present

## 2017-02-11 DIAGNOSIS — D689 Coagulation defect, unspecified: Secondary | ICD-10-CM | POA: Diagnosis not present

## 2017-02-11 DIAGNOSIS — D509 Iron deficiency anemia, unspecified: Secondary | ICD-10-CM | POA: Diagnosis not present

## 2017-02-11 DIAGNOSIS — N2581 Secondary hyperparathyroidism of renal origin: Secondary | ICD-10-CM | POA: Diagnosis not present

## 2017-02-11 DIAGNOSIS — E1129 Type 2 diabetes mellitus with other diabetic kidney complication: Secondary | ICD-10-CM | POA: Diagnosis not present

## 2017-02-11 MED FILL — UNIFINE PENTIPS 8MM 31G: 31G X 8 MM | 90 days supply | Qty: 100 | Fill #0

## 2017-02-11 MED FILL — CONTOUR NEXT STRIPS: 33 days supply | Qty: 100 | Fill #0

## 2017-02-13 DIAGNOSIS — N2581 Secondary hyperparathyroidism of renal origin: Secondary | ICD-10-CM | POA: Diagnosis not present

## 2017-02-13 DIAGNOSIS — E1129 Type 2 diabetes mellitus with other diabetic kidney complication: Secondary | ICD-10-CM | POA: Diagnosis not present

## 2017-02-13 DIAGNOSIS — D509 Iron deficiency anemia, unspecified: Secondary | ICD-10-CM | POA: Diagnosis not present

## 2017-02-13 DIAGNOSIS — D631 Anemia in chronic kidney disease: Secondary | ICD-10-CM | POA: Diagnosis not present

## 2017-02-13 DIAGNOSIS — N186 End stage renal disease: Secondary | ICD-10-CM | POA: Diagnosis not present

## 2017-02-13 DIAGNOSIS — D689 Coagulation defect, unspecified: Secondary | ICD-10-CM | POA: Diagnosis not present

## 2017-02-16 DIAGNOSIS — E1129 Type 2 diabetes mellitus with other diabetic kidney complication: Secondary | ICD-10-CM | POA: Diagnosis not present

## 2017-02-16 DIAGNOSIS — D631 Anemia in chronic kidney disease: Secondary | ICD-10-CM | POA: Diagnosis not present

## 2017-02-16 DIAGNOSIS — D509 Iron deficiency anemia, unspecified: Secondary | ICD-10-CM | POA: Diagnosis not present

## 2017-02-16 DIAGNOSIS — N186 End stage renal disease: Secondary | ICD-10-CM | POA: Diagnosis not present

## 2017-02-16 DIAGNOSIS — D689 Coagulation defect, unspecified: Secondary | ICD-10-CM | POA: Diagnosis not present

## 2017-02-16 DIAGNOSIS — N2581 Secondary hyperparathyroidism of renal origin: Secondary | ICD-10-CM | POA: Diagnosis not present

## 2017-02-16 MED FILL — VIMPAT 150 MG TABLET: 150 | 30 days supply | Qty: 60 | Fill #2

## 2017-02-17 MED FILL — METOPROLOL TARTRATE 25 MG T: 25 | 60 days supply | Qty: 60 | Fill #0

## 2017-02-18 DIAGNOSIS — D509 Iron deficiency anemia, unspecified: Secondary | ICD-10-CM | POA: Diagnosis not present

## 2017-02-18 DIAGNOSIS — N2581 Secondary hyperparathyroidism of renal origin: Secondary | ICD-10-CM | POA: Diagnosis not present

## 2017-02-18 DIAGNOSIS — D631 Anemia in chronic kidney disease: Secondary | ICD-10-CM | POA: Diagnosis not present

## 2017-02-18 DIAGNOSIS — N186 End stage renal disease: Secondary | ICD-10-CM | POA: Diagnosis not present

## 2017-02-18 DIAGNOSIS — E1129 Type 2 diabetes mellitus with other diabetic kidney complication: Secondary | ICD-10-CM | POA: Diagnosis not present

## 2017-02-18 DIAGNOSIS — D689 Coagulation defect, unspecified: Secondary | ICD-10-CM | POA: Diagnosis not present

## 2017-02-19 ENCOUNTER — Other Ambulatory Visit: Payer: Self-pay

## 2017-02-20 DIAGNOSIS — N186 End stage renal disease: Secondary | ICD-10-CM | POA: Diagnosis not present

## 2017-02-20 DIAGNOSIS — D689 Coagulation defect, unspecified: Secondary | ICD-10-CM | POA: Diagnosis not present

## 2017-02-20 DIAGNOSIS — D631 Anemia in chronic kidney disease: Secondary | ICD-10-CM | POA: Diagnosis not present

## 2017-02-20 DIAGNOSIS — E1129 Type 2 diabetes mellitus with other diabetic kidney complication: Secondary | ICD-10-CM | POA: Diagnosis not present

## 2017-02-20 DIAGNOSIS — N2581 Secondary hyperparathyroidism of renal origin: Secondary | ICD-10-CM | POA: Diagnosis not present

## 2017-02-20 DIAGNOSIS — D509 Iron deficiency anemia, unspecified: Secondary | ICD-10-CM | POA: Diagnosis not present

## 2017-02-23 DIAGNOSIS — D689 Coagulation defect, unspecified: Secondary | ICD-10-CM | POA: Diagnosis not present

## 2017-02-23 DIAGNOSIS — D509 Iron deficiency anemia, unspecified: Secondary | ICD-10-CM | POA: Diagnosis not present

## 2017-02-23 DIAGNOSIS — N2581 Secondary hyperparathyroidism of renal origin: Secondary | ICD-10-CM | POA: Diagnosis not present

## 2017-02-23 DIAGNOSIS — E1129 Type 2 diabetes mellitus with other diabetic kidney complication: Secondary | ICD-10-CM | POA: Diagnosis not present

## 2017-02-23 DIAGNOSIS — N186 End stage renal disease: Secondary | ICD-10-CM | POA: Diagnosis not present

## 2017-02-25 DIAGNOSIS — E1129 Type 2 diabetes mellitus with other diabetic kidney complication: Secondary | ICD-10-CM | POA: Diagnosis not present

## 2017-02-25 DIAGNOSIS — N2581 Secondary hyperparathyroidism of renal origin: Secondary | ICD-10-CM | POA: Diagnosis not present

## 2017-02-25 DIAGNOSIS — D689 Coagulation defect, unspecified: Secondary | ICD-10-CM | POA: Diagnosis not present

## 2017-02-25 DIAGNOSIS — D509 Iron deficiency anemia, unspecified: Secondary | ICD-10-CM | POA: Diagnosis not present

## 2017-02-25 DIAGNOSIS — N186 End stage renal disease: Secondary | ICD-10-CM | POA: Diagnosis not present

## 2017-02-26 ENCOUNTER — Ambulatory Visit: Payer: Self-pay | Admitting: Oncology

## 2017-02-26 DIAGNOSIS — B351 Tinea unguium: Secondary | ICD-10-CM | POA: Diagnosis not present

## 2017-02-26 DIAGNOSIS — M2042 Other hammer toe(s) (acquired), left foot: Secondary | ICD-10-CM | POA: Diagnosis not present

## 2017-02-26 DIAGNOSIS — E119 Type 2 diabetes mellitus without complications: Secondary | ICD-10-CM | POA: Diagnosis not present

## 2017-02-26 DIAGNOSIS — M2041 Other hammer toe(s) (acquired), right foot: Secondary | ICD-10-CM | POA: Diagnosis not present

## 2017-02-27 DIAGNOSIS — D509 Iron deficiency anemia, unspecified: Secondary | ICD-10-CM | POA: Diagnosis not present

## 2017-02-27 DIAGNOSIS — E1129 Type 2 diabetes mellitus with other diabetic kidney complication: Secondary | ICD-10-CM | POA: Diagnosis not present

## 2017-02-27 DIAGNOSIS — D689 Coagulation defect, unspecified: Secondary | ICD-10-CM | POA: Diagnosis not present

## 2017-02-27 DIAGNOSIS — N186 End stage renal disease: Secondary | ICD-10-CM | POA: Diagnosis not present

## 2017-02-27 DIAGNOSIS — N2581 Secondary hyperparathyroidism of renal origin: Secondary | ICD-10-CM | POA: Diagnosis not present

## 2017-03-03 DIAGNOSIS — D509 Iron deficiency anemia, unspecified: Secondary | ICD-10-CM | POA: Diagnosis not present

## 2017-03-03 DIAGNOSIS — D631 Anemia in chronic kidney disease: Secondary | ICD-10-CM | POA: Diagnosis not present

## 2017-03-03 DIAGNOSIS — D689 Coagulation defect, unspecified: Secondary | ICD-10-CM | POA: Diagnosis not present

## 2017-03-03 DIAGNOSIS — N186 End stage renal disease: Secondary | ICD-10-CM | POA: Diagnosis not present

## 2017-03-05 ENCOUNTER — Ambulatory Visit: Payer: Self-pay | Admitting: Oncology

## 2017-03-05 DIAGNOSIS — D689 Coagulation defect, unspecified: Secondary | ICD-10-CM | POA: Diagnosis not present

## 2017-03-05 DIAGNOSIS — N186 End stage renal disease: Secondary | ICD-10-CM | POA: Diagnosis not present

## 2017-03-05 DIAGNOSIS — D509 Iron deficiency anemia, unspecified: Secondary | ICD-10-CM | POA: Diagnosis not present

## 2017-03-05 DIAGNOSIS — D631 Anemia in chronic kidney disease: Secondary | ICD-10-CM | POA: Diagnosis not present

## 2017-03-07 DIAGNOSIS — D689 Coagulation defect, unspecified: Secondary | ICD-10-CM | POA: Diagnosis not present

## 2017-03-07 DIAGNOSIS — N186 End stage renal disease: Secondary | ICD-10-CM | POA: Diagnosis not present

## 2017-03-07 DIAGNOSIS — Z992 Dependence on renal dialysis: Secondary | ICD-10-CM | POA: Diagnosis not present

## 2017-03-07 DIAGNOSIS — D509 Iron deficiency anemia, unspecified: Secondary | ICD-10-CM | POA: Diagnosis not present

## 2017-03-07 DIAGNOSIS — D631 Anemia in chronic kidney disease: Secondary | ICD-10-CM | POA: Diagnosis not present

## 2017-03-07 DIAGNOSIS — E1122 Type 2 diabetes mellitus with diabetic chronic kidney disease: Secondary | ICD-10-CM | POA: Diagnosis not present

## 2017-03-10 DIAGNOSIS — D689 Coagulation defect, unspecified: Secondary | ICD-10-CM | POA: Diagnosis not present

## 2017-03-10 DIAGNOSIS — N186 End stage renal disease: Secondary | ICD-10-CM | POA: Diagnosis not present

## 2017-03-10 DIAGNOSIS — D509 Iron deficiency anemia, unspecified: Secondary | ICD-10-CM | POA: Diagnosis not present

## 2017-03-12 DIAGNOSIS — D509 Iron deficiency anemia, unspecified: Secondary | ICD-10-CM | POA: Diagnosis not present

## 2017-03-12 DIAGNOSIS — N186 End stage renal disease: Secondary | ICD-10-CM | POA: Diagnosis not present

## 2017-03-12 DIAGNOSIS — D689 Coagulation defect, unspecified: Secondary | ICD-10-CM | POA: Diagnosis not present

## 2017-03-13 MED FILL — VIMPAT 150 MG TABLET: 150 | 30 days supply | Qty: 60 | Fill #3

## 2017-03-13 MED FILL — MIDODRINE HCL 10 MG TABLET: 10 | 35 days supply | Qty: 70 | Fill #2

## 2017-03-13 MED FILL — CALCIUM ACETATE 667 MG CAP: 667 | 30 days supply | Qty: 180 | Fill #8

## 2017-03-14 DIAGNOSIS — D689 Coagulation defect, unspecified: Secondary | ICD-10-CM | POA: Diagnosis not present

## 2017-03-14 DIAGNOSIS — D509 Iron deficiency anemia, unspecified: Secondary | ICD-10-CM | POA: Diagnosis not present

## 2017-03-14 DIAGNOSIS — N186 End stage renal disease: Secondary | ICD-10-CM | POA: Diagnosis not present

## 2017-03-16 MED FILL — lamoTRIgine 150 MG TABS: 150 | 90 days supply | Qty: 180 | Fill #0

## 2017-03-17 DIAGNOSIS — D509 Iron deficiency anemia, unspecified: Secondary | ICD-10-CM | POA: Diagnosis not present

## 2017-03-17 DIAGNOSIS — D631 Anemia in chronic kidney disease: Secondary | ICD-10-CM | POA: Diagnosis not present

## 2017-03-17 DIAGNOSIS — N186 End stage renal disease: Secondary | ICD-10-CM | POA: Diagnosis not present

## 2017-03-17 MED FILL — SENSIPAR 60 MG TABLET: 60 | 30 days supply | Qty: 30 | Fill #0

## 2017-03-19 DIAGNOSIS — N186 End stage renal disease: Secondary | ICD-10-CM | POA: Diagnosis not present

## 2017-03-19 DIAGNOSIS — D509 Iron deficiency anemia, unspecified: Secondary | ICD-10-CM | POA: Diagnosis not present

## 2017-03-19 DIAGNOSIS — D631 Anemia in chronic kidney disease: Secondary | ICD-10-CM | POA: Diagnosis not present

## 2017-03-21 DIAGNOSIS — D631 Anemia in chronic kidney disease: Secondary | ICD-10-CM | POA: Diagnosis not present

## 2017-03-21 DIAGNOSIS — N186 End stage renal disease: Secondary | ICD-10-CM | POA: Diagnosis not present

## 2017-03-21 DIAGNOSIS — D509 Iron deficiency anemia, unspecified: Secondary | ICD-10-CM | POA: Diagnosis not present

## 2017-03-24 DIAGNOSIS — D509 Iron deficiency anemia, unspecified: Secondary | ICD-10-CM | POA: Diagnosis not present

## 2017-03-24 DIAGNOSIS — E1129 Type 2 diabetes mellitus with other diabetic kidney complication: Secondary | ICD-10-CM | POA: Diagnosis not present

## 2017-03-24 DIAGNOSIS — N186 End stage renal disease: Secondary | ICD-10-CM | POA: Diagnosis not present

## 2017-03-24 DIAGNOSIS — D689 Coagulation defect, unspecified: Secondary | ICD-10-CM | POA: Diagnosis not present

## 2017-03-26 DIAGNOSIS — D689 Coagulation defect, unspecified: Secondary | ICD-10-CM | POA: Diagnosis not present

## 2017-03-26 DIAGNOSIS — N186 End stage renal disease: Secondary | ICD-10-CM | POA: Diagnosis not present

## 2017-03-26 DIAGNOSIS — D509 Iron deficiency anemia, unspecified: Secondary | ICD-10-CM | POA: Diagnosis not present

## 2017-03-26 DIAGNOSIS — E1129 Type 2 diabetes mellitus with other diabetic kidney complication: Secondary | ICD-10-CM | POA: Diagnosis not present

## 2017-03-27 MED FILL — CONTOUR NEXT STRIPS: 33 days supply | Qty: 100 | Fill #1

## 2017-03-28 DIAGNOSIS — D689 Coagulation defect, unspecified: Secondary | ICD-10-CM | POA: Diagnosis not present

## 2017-03-28 DIAGNOSIS — D509 Iron deficiency anemia, unspecified: Secondary | ICD-10-CM | POA: Diagnosis not present

## 2017-03-28 DIAGNOSIS — N186 End stage renal disease: Secondary | ICD-10-CM | POA: Diagnosis not present

## 2017-03-28 DIAGNOSIS — E1129 Type 2 diabetes mellitus with other diabetic kidney complication: Secondary | ICD-10-CM | POA: Diagnosis not present

## 2017-03-30 DIAGNOSIS — N186 End stage renal disease: Secondary | ICD-10-CM | POA: Diagnosis not present

## 2017-03-30 DIAGNOSIS — D631 Anemia in chronic kidney disease: Secondary | ICD-10-CM | POA: Diagnosis not present

## 2017-03-30 DIAGNOSIS — D509 Iron deficiency anemia, unspecified: Secondary | ICD-10-CM | POA: Diagnosis not present

## 2017-03-30 DIAGNOSIS — E1129 Type 2 diabetes mellitus with other diabetic kidney complication: Secondary | ICD-10-CM | POA: Diagnosis not present

## 2017-03-30 DIAGNOSIS — D689 Coagulation defect, unspecified: Secondary | ICD-10-CM | POA: Diagnosis not present

## 2017-03-30 DIAGNOSIS — N2581 Secondary hyperparathyroidism of renal origin: Secondary | ICD-10-CM | POA: Diagnosis not present

## 2017-04-01 DIAGNOSIS — D631 Anemia in chronic kidney disease: Secondary | ICD-10-CM | POA: Diagnosis not present

## 2017-04-01 DIAGNOSIS — D509 Iron deficiency anemia, unspecified: Secondary | ICD-10-CM | POA: Diagnosis not present

## 2017-04-01 DIAGNOSIS — N2581 Secondary hyperparathyroidism of renal origin: Secondary | ICD-10-CM | POA: Diagnosis not present

## 2017-04-01 DIAGNOSIS — N186 End stage renal disease: Secondary | ICD-10-CM | POA: Diagnosis not present

## 2017-04-01 DIAGNOSIS — E1129 Type 2 diabetes mellitus with other diabetic kidney complication: Secondary | ICD-10-CM | POA: Diagnosis not present

## 2017-04-01 DIAGNOSIS — D689 Coagulation defect, unspecified: Secondary | ICD-10-CM | POA: Diagnosis not present

## 2017-04-02 DIAGNOSIS — E1169 Type 2 diabetes mellitus with other specified complication: Secondary | ICD-10-CM | POA: Diagnosis not present

## 2017-04-02 DIAGNOSIS — E1165 Type 2 diabetes mellitus with hyperglycemia: Secondary | ICD-10-CM | POA: Diagnosis not present

## 2017-04-02 DIAGNOSIS — Z7901 Long term (current) use of anticoagulants: Secondary | ICD-10-CM | POA: Diagnosis not present

## 2017-04-02 DIAGNOSIS — G40309 Generalized idiopathic epilepsy and epileptic syndromes, not intractable, without status epilepticus: Secondary | ICD-10-CM | POA: Diagnosis not present

## 2017-04-02 DIAGNOSIS — I6789 Other cerebrovascular disease: Secondary | ICD-10-CM | POA: Diagnosis not present

## 2017-04-03 DIAGNOSIS — D631 Anemia in chronic kidney disease: Secondary | ICD-10-CM | POA: Diagnosis not present

## 2017-04-03 DIAGNOSIS — D689 Coagulation defect, unspecified: Secondary | ICD-10-CM | POA: Diagnosis not present

## 2017-04-03 DIAGNOSIS — D509 Iron deficiency anemia, unspecified: Secondary | ICD-10-CM | POA: Diagnosis not present

## 2017-04-03 DIAGNOSIS — E1129 Type 2 diabetes mellitus with other diabetic kidney complication: Secondary | ICD-10-CM | POA: Diagnosis not present

## 2017-04-03 DIAGNOSIS — N2581 Secondary hyperparathyroidism of renal origin: Secondary | ICD-10-CM | POA: Diagnosis not present

## 2017-04-03 DIAGNOSIS — N186 End stage renal disease: Secondary | ICD-10-CM | POA: Diagnosis not present

## 2017-04-06 DIAGNOSIS — D689 Coagulation defect, unspecified: Secondary | ICD-10-CM | POA: Diagnosis not present

## 2017-04-06 DIAGNOSIS — E1129 Type 2 diabetes mellitus with other diabetic kidney complication: Secondary | ICD-10-CM | POA: Diagnosis not present

## 2017-04-06 DIAGNOSIS — N2581 Secondary hyperparathyroidism of renal origin: Secondary | ICD-10-CM | POA: Diagnosis not present

## 2017-04-06 DIAGNOSIS — D509 Iron deficiency anemia, unspecified: Secondary | ICD-10-CM | POA: Diagnosis not present

## 2017-04-06 DIAGNOSIS — N186 End stage renal disease: Secondary | ICD-10-CM | POA: Diagnosis not present

## 2017-04-07 ENCOUNTER — Telehealth: Payer: Self-pay | Admitting: Cardiovascular Disease

## 2017-04-07 DIAGNOSIS — N186 End stage renal disease: Secondary | ICD-10-CM

## 2017-04-07 DIAGNOSIS — Z992 Dependence on renal dialysis: Principal | ICD-10-CM

## 2017-04-07 DIAGNOSIS — Z01810 Encounter for preprocedural cardiovascular examination: Secondary | ICD-10-CM

## 2017-04-07 DIAGNOSIS — Z7682 Awaiting organ transplant status: Secondary | ICD-10-CM | POA: Diagnosis not present

## 2017-04-07 DIAGNOSIS — Z95828 Presence of other vascular implants and grafts: Secondary | ICD-10-CM | POA: Diagnosis not present

## 2017-04-07 DIAGNOSIS — I120 Hypertensive chronic kidney disease with stage 5 chronic kidney disease or end stage renal disease: Secondary | ICD-10-CM | POA: Diagnosis not present

## 2017-04-07 DIAGNOSIS — E1122 Type 2 diabetes mellitus with diabetic chronic kidney disease: Secondary | ICD-10-CM | POA: Diagnosis not present

## 2017-04-07 DIAGNOSIS — N185 Chronic kidney disease, stage 5: Secondary | ICD-10-CM | POA: Diagnosis not present

## 2017-04-07 DIAGNOSIS — I5042 Chronic combined systolic (congestive) and diastolic (congestive) heart failure: Secondary | ICD-10-CM

## 2017-04-07 NOTE — Telephone Encounter (Signed)
No answer

## 2017-04-07 NOTE — Telephone Encounter (Signed)
New Message     They were at [facility] to meet with the transplant team and they need you to run 2 tests so that they can get results.   They need echo to evaluate left and rt heart function  And a cardiac stress test to evaluate CAD

## 2017-04-08 DIAGNOSIS — E1129 Type 2 diabetes mellitus with other diabetic kidney complication: Secondary | ICD-10-CM | POA: Diagnosis not present

## 2017-04-08 DIAGNOSIS — N2581 Secondary hyperparathyroidism of renal origin: Secondary | ICD-10-CM | POA: Diagnosis not present

## 2017-04-08 DIAGNOSIS — N186 End stage renal disease: Secondary | ICD-10-CM | POA: Diagnosis not present

## 2017-04-08 DIAGNOSIS — D509 Iron deficiency anemia, unspecified: Secondary | ICD-10-CM | POA: Diagnosis not present

## 2017-04-08 DIAGNOSIS — D689 Coagulation defect, unspecified: Secondary | ICD-10-CM | POA: Diagnosis not present

## 2017-04-08 NOTE — Telephone Encounter (Signed)
Reviewed 04/07/17 office visit note ([facility]) in Care Everywhere and Dr Lissa Merlin did request that pt have Echocardiogram and Cardiac stress testing performed as part of evaluation for transplant.  I will forward this information to Dr Burt Knack so that official order can be given for testing and specify if pt needs Exercise stress myoview or Lexiscan.

## 2017-04-10 DIAGNOSIS — N186 End stage renal disease: Secondary | ICD-10-CM | POA: Diagnosis not present

## 2017-04-10 DIAGNOSIS — D509 Iron deficiency anemia, unspecified: Secondary | ICD-10-CM | POA: Diagnosis not present

## 2017-04-10 DIAGNOSIS — D689 Coagulation defect, unspecified: Secondary | ICD-10-CM | POA: Diagnosis not present

## 2017-04-10 DIAGNOSIS — N2581 Secondary hyperparathyroidism of renal origin: Secondary | ICD-10-CM | POA: Diagnosis not present

## 2017-04-10 DIAGNOSIS — E1129 Type 2 diabetes mellitus with other diabetic kidney complication: Secondary | ICD-10-CM | POA: Diagnosis not present

## 2017-04-10 NOTE — Telephone Encounter (Signed)
I spoke with the pt's [name] and made her aware that Dr Burt Knack reviewed information from [place]. Per Dr Burt Knack the pt can have Echocardiogram and Lexiscan myoview ordered.  I reviewed pre myoview instructions with [name]. Orders placed and I advised her that a scheduler will contact her with appts. The pt does dialysis and his appointments need to be scheduled on Tuesday and Thursday.

## 2017-04-10 NOTE — Telephone Encounter (Signed)
Please place orders for echo and Myoview stress test. thanks

## 2017-04-10 NOTE — Telephone Encounter (Signed)
F/U call: Isaiah [name] calling states that she would like a f/u

## 2017-04-13 ENCOUNTER — Telehealth (HOSPITAL_COMMUNITY): Payer: Self-pay | Admitting: Cardiovascular Disease

## 2017-04-13 DIAGNOSIS — N2581 Secondary hyperparathyroidism of renal origin: Secondary | ICD-10-CM | POA: Diagnosis not present

## 2017-04-13 DIAGNOSIS — D631 Anemia in chronic kidney disease: Secondary | ICD-10-CM | POA: Diagnosis not present

## 2017-04-13 DIAGNOSIS — N186 End stage renal disease: Secondary | ICD-10-CM | POA: Diagnosis not present

## 2017-04-13 DIAGNOSIS — E1129 Type 2 diabetes mellitus with other diabetic kidney complication: Secondary | ICD-10-CM | POA: Diagnosis not present

## 2017-04-13 DIAGNOSIS — D509 Iron deficiency anemia, unspecified: Secondary | ICD-10-CM | POA: Diagnosis not present

## 2017-04-13 DIAGNOSIS — D689 Coagulation defect, unspecified: Secondary | ICD-10-CM | POA: Diagnosis not present

## 2017-04-13 MED FILL — MIDODRINE HCL 10 MG TABLET: 10 | 35 days supply | Qty: 70 | Fill #3

## 2017-04-13 MED FILL — METOPROLOL TARTRATE 25 MG T: 25 | 60 days supply | Qty: 60 | Fill #1

## 2017-04-13 MED FILL — VIMPAT 150 MG TABS: 150 | 30 days supply | Qty: 60 | Fill #4

## 2017-04-14 NOTE — Telephone Encounter (Signed)
Appointments are scheduled on 04/21/17.

## 2017-04-14 NOTE — Telephone Encounter (Signed)
User: Cherie Dark A Date/time: 04/13/17 10:27 AM  Comment: Called pt and lmsg for him to CB to sch test needed for surgical clearance.   Context:  Outcome: Left Message  Phone number: [phone] Phone Type: Home Phone  Comm. type: Telephone Call type: Outgoing  Contact: [place], Thurmon W Relation to patient: Self

## 2017-04-15 ENCOUNTER — Telehealth (HOSPITAL_COMMUNITY): Payer: Self-pay

## 2017-04-15 DIAGNOSIS — N186 End stage renal disease: Secondary | ICD-10-CM | POA: Diagnosis not present

## 2017-04-15 DIAGNOSIS — D631 Anemia in chronic kidney disease: Secondary | ICD-10-CM | POA: Diagnosis not present

## 2017-04-15 DIAGNOSIS — D689 Coagulation defect, unspecified: Secondary | ICD-10-CM | POA: Diagnosis not present

## 2017-04-15 DIAGNOSIS — E1129 Type 2 diabetes mellitus with other diabetic kidney complication: Secondary | ICD-10-CM | POA: Diagnosis not present

## 2017-04-15 DIAGNOSIS — N2581 Secondary hyperparathyroidism of renal origin: Secondary | ICD-10-CM | POA: Diagnosis not present

## 2017-04-15 DIAGNOSIS — D509 Iron deficiency anemia, unspecified: Secondary | ICD-10-CM | POA: Diagnosis not present

## 2017-04-15 NOTE — Telephone Encounter (Signed)
Pt's wife was reached. Pt instructions were given and understood. Pt's wife said that he will be there. S.Coby Sh[place] EMTP

## 2017-04-17 DIAGNOSIS — N186 End stage renal disease: Secondary | ICD-10-CM | POA: Diagnosis not present

## 2017-04-17 DIAGNOSIS — D509 Iron deficiency anemia, unspecified: Secondary | ICD-10-CM | POA: Diagnosis not present

## 2017-04-17 DIAGNOSIS — E1129 Type 2 diabetes mellitus with other diabetic kidney complication: Secondary | ICD-10-CM | POA: Diagnosis not present

## 2017-04-17 DIAGNOSIS — D689 Coagulation defect, unspecified: Secondary | ICD-10-CM | POA: Diagnosis not present

## 2017-04-17 DIAGNOSIS — D631 Anemia in chronic kidney disease: Secondary | ICD-10-CM | POA: Diagnosis not present

## 2017-04-17 DIAGNOSIS — N2581 Secondary hyperparathyroidism of renal origin: Secondary | ICD-10-CM | POA: Diagnosis not present

## 2017-04-20 ENCOUNTER — Encounter (HOSPITAL_COMMUNITY): Payer: Self-pay

## 2017-04-20 DIAGNOSIS — D689 Coagulation defect, unspecified: Secondary | ICD-10-CM | POA: Diagnosis not present

## 2017-04-20 DIAGNOSIS — D509 Iron deficiency anemia, unspecified: Secondary | ICD-10-CM | POA: Diagnosis not present

## 2017-04-20 DIAGNOSIS — N2581 Secondary hyperparathyroidism of renal origin: Secondary | ICD-10-CM | POA: Diagnosis not present

## 2017-04-20 DIAGNOSIS — N186 End stage renal disease: Secondary | ICD-10-CM | POA: Diagnosis not present

## 2017-04-20 DIAGNOSIS — E1129 Type 2 diabetes mellitus with other diabetic kidney complication: Secondary | ICD-10-CM | POA: Diagnosis not present

## 2017-04-21 ENCOUNTER — Ambulatory Visit (HOSPITAL_BASED_OUTPATIENT_CLINIC_OR_DEPARTMENT_OTHER): Payer: 59

## 2017-04-21 ENCOUNTER — Ambulatory Visit (HOSPITAL_COMMUNITY): Payer: 59 | Attending: Cardiovascular Disease

## 2017-04-21 ENCOUNTER — Other Ambulatory Visit: Payer: Self-pay

## 2017-04-21 DIAGNOSIS — Z01810 Encounter for preprocedural cardiovascular examination: Secondary | ICD-10-CM | POA: Diagnosis not present

## 2017-04-21 DIAGNOSIS — Z992 Dependence on renal dialysis: Secondary | ICD-10-CM

## 2017-04-21 DIAGNOSIS — N186 End stage renal disease: Secondary | ICD-10-CM | POA: Diagnosis not present

## 2017-04-21 DIAGNOSIS — I5042 Chronic combined systolic (congestive) and diastolic (congestive) heart failure: Secondary | ICD-10-CM

## 2017-04-21 LAB — MYOCARDIAL PERFUSION IMAGING
LV dias vol: 128 mL (ref 62–150)
LV sys vol: 56 mL
Peak HR: 86 {beats}/min
RATE: 0.32
Rest HR: 56 {beats}/min
SDS: 0
SRS: 4
SSS: 4
TID: 0.93

## 2017-04-21 MED ORDER — TECHNETIUM TC 99M TETROFOSMIN IV KIT
10.2000 | PACK | Freq: Once | INTRAVENOUS | Status: AC | PRN
  Administered 2017-04-21: 10.2 via INTRAVENOUS
  Filled 2017-04-21: qty 11

## 2017-04-21 MED ORDER — TECHNETIUM TC 99M TETROFOSMIN IV KIT
32.7000 | PACK | Freq: Once | INTRAVENOUS | Status: AC | PRN
  Administered 2017-04-21: 32.7 via INTRAVENOUS
  Filled 2017-04-21: qty 33

## 2017-04-21 MED ORDER — REGADENOSON 0.4 MG/5ML IV SOLN
0.4000 mg | Freq: Once | INTRAVENOUS | Status: AC
  Administered 2017-04-21: 0.4 mg via INTRAVENOUS

## 2017-04-21 MED FILL — BREO ELLIPTA 200-25 MCG INH: 200-25 | 30 days supply | Qty: 60 | Fill #3

## 2017-04-22 DIAGNOSIS — D689 Coagulation defect, unspecified: Secondary | ICD-10-CM | POA: Diagnosis not present

## 2017-04-22 DIAGNOSIS — N2581 Secondary hyperparathyroidism of renal origin: Secondary | ICD-10-CM | POA: Diagnosis not present

## 2017-04-22 DIAGNOSIS — D509 Iron deficiency anemia, unspecified: Secondary | ICD-10-CM | POA: Diagnosis not present

## 2017-04-22 DIAGNOSIS — E1129 Type 2 diabetes mellitus with other diabetic kidney complication: Secondary | ICD-10-CM | POA: Diagnosis not present

## 2017-04-22 DIAGNOSIS — N186 End stage renal disease: Secondary | ICD-10-CM | POA: Diagnosis not present

## 2017-04-24 DIAGNOSIS — N2581 Secondary hyperparathyroidism of renal origin: Secondary | ICD-10-CM | POA: Diagnosis not present

## 2017-04-24 DIAGNOSIS — E1129 Type 2 diabetes mellitus with other diabetic kidney complication: Secondary | ICD-10-CM | POA: Diagnosis not present

## 2017-04-24 DIAGNOSIS — D689 Coagulation defect, unspecified: Secondary | ICD-10-CM | POA: Diagnosis not present

## 2017-04-24 DIAGNOSIS — D509 Iron deficiency anemia, unspecified: Secondary | ICD-10-CM | POA: Diagnosis not present

## 2017-04-24 DIAGNOSIS — N186 End stage renal disease: Secondary | ICD-10-CM | POA: Diagnosis not present

## 2017-04-27 DIAGNOSIS — D509 Iron deficiency anemia, unspecified: Secondary | ICD-10-CM | POA: Diagnosis not present

## 2017-04-27 DIAGNOSIS — E1129 Type 2 diabetes mellitus with other diabetic kidney complication: Secondary | ICD-10-CM | POA: Diagnosis not present

## 2017-04-27 DIAGNOSIS — N2581 Secondary hyperparathyroidism of renal origin: Secondary | ICD-10-CM | POA: Diagnosis not present

## 2017-04-27 DIAGNOSIS — D631 Anemia in chronic kidney disease: Secondary | ICD-10-CM | POA: Diagnosis not present

## 2017-04-27 DIAGNOSIS — D689 Coagulation defect, unspecified: Secondary | ICD-10-CM | POA: Diagnosis not present

## 2017-04-27 DIAGNOSIS — N186 End stage renal disease: Secondary | ICD-10-CM | POA: Diagnosis not present

## 2017-04-27 MED FILL — PANTOPRAZOLE SOD DR 40 MG T: 40 | 90 days supply | Qty: 90 | Fill #1

## 2017-04-29 DIAGNOSIS — D631 Anemia in chronic kidney disease: Secondary | ICD-10-CM | POA: Diagnosis not present

## 2017-04-29 DIAGNOSIS — E1129 Type 2 diabetes mellitus with other diabetic kidney complication: Secondary | ICD-10-CM | POA: Diagnosis not present

## 2017-04-29 DIAGNOSIS — N2581 Secondary hyperparathyroidism of renal origin: Secondary | ICD-10-CM | POA: Diagnosis not present

## 2017-04-29 DIAGNOSIS — D689 Coagulation defect, unspecified: Secondary | ICD-10-CM | POA: Diagnosis not present

## 2017-04-29 DIAGNOSIS — D509 Iron deficiency anemia, unspecified: Secondary | ICD-10-CM | POA: Diagnosis not present

## 2017-04-29 DIAGNOSIS — N186 End stage renal disease: Secondary | ICD-10-CM | POA: Diagnosis not present

## 2017-04-29 MED FILL — LANTUS SOLOSTAR 100 UNITS/M: 100 | 60 days supply | Qty: 15 | Fill #2

## 2017-05-01 DIAGNOSIS — N2581 Secondary hyperparathyroidism of renal origin: Secondary | ICD-10-CM | POA: Diagnosis not present

## 2017-05-01 DIAGNOSIS — D631 Anemia in chronic kidney disease: Secondary | ICD-10-CM | POA: Diagnosis not present

## 2017-05-01 DIAGNOSIS — D689 Coagulation defect, unspecified: Secondary | ICD-10-CM | POA: Diagnosis not present

## 2017-05-01 DIAGNOSIS — E1129 Type 2 diabetes mellitus with other diabetic kidney complication: Secondary | ICD-10-CM | POA: Diagnosis not present

## 2017-05-01 DIAGNOSIS — D509 Iron deficiency anemia, unspecified: Secondary | ICD-10-CM | POA: Diagnosis not present

## 2017-05-01 DIAGNOSIS — N186 End stage renal disease: Secondary | ICD-10-CM | POA: Diagnosis not present

## 2017-05-04 DIAGNOSIS — E1129 Type 2 diabetes mellitus with other diabetic kidney complication: Secondary | ICD-10-CM | POA: Diagnosis not present

## 2017-05-04 DIAGNOSIS — N186 End stage renal disease: Secondary | ICD-10-CM | POA: Diagnosis not present

## 2017-05-04 DIAGNOSIS — D689 Coagulation defect, unspecified: Secondary | ICD-10-CM | POA: Diagnosis not present

## 2017-05-04 DIAGNOSIS — N2581 Secondary hyperparathyroidism of renal origin: Secondary | ICD-10-CM | POA: Diagnosis not present

## 2017-05-04 DIAGNOSIS — D509 Iron deficiency anemia, unspecified: Secondary | ICD-10-CM | POA: Diagnosis not present

## 2017-05-06 DIAGNOSIS — E1129 Type 2 diabetes mellitus with other diabetic kidney complication: Secondary | ICD-10-CM | POA: Diagnosis not present

## 2017-05-06 DIAGNOSIS — D689 Coagulation defect, unspecified: Secondary | ICD-10-CM | POA: Diagnosis not present

## 2017-05-06 DIAGNOSIS — N2581 Secondary hyperparathyroidism of renal origin: Secondary | ICD-10-CM | POA: Diagnosis not present

## 2017-05-06 DIAGNOSIS — N186 End stage renal disease: Secondary | ICD-10-CM | POA: Diagnosis not present

## 2017-05-06 DIAGNOSIS — D509 Iron deficiency anemia, unspecified: Secondary | ICD-10-CM | POA: Diagnosis not present

## 2017-05-07 DIAGNOSIS — Z95828 Presence of other vascular implants and grafts: Secondary | ICD-10-CM | POA: Diagnosis not present

## 2017-05-07 DIAGNOSIS — N185 Chronic kidney disease, stage 5: Secondary | ICD-10-CM | POA: Diagnosis not present

## 2017-05-07 DIAGNOSIS — N186 End stage renal disease: Secondary | ICD-10-CM | POA: Diagnosis not present

## 2017-05-07 DIAGNOSIS — Z7682 Awaiting organ transplant status: Secondary | ICD-10-CM | POA: Diagnosis not present

## 2017-05-07 DIAGNOSIS — I871 Compression of vein: Secondary | ICD-10-CM | POA: Diagnosis not present

## 2017-05-07 DIAGNOSIS — T82858A Stenosis of vascular prosthetic devices, implants and grafts, initial encounter: Secondary | ICD-10-CM | POA: Diagnosis not present

## 2017-05-07 DIAGNOSIS — Z992 Dependence on renal dialysis: Secondary | ICD-10-CM | POA: Diagnosis not present

## 2017-05-08 DIAGNOSIS — Z992 Dependence on renal dialysis: Secondary | ICD-10-CM | POA: Diagnosis not present

## 2017-05-08 DIAGNOSIS — N186 End stage renal disease: Secondary | ICD-10-CM | POA: Diagnosis not present

## 2017-05-08 DIAGNOSIS — D689 Coagulation defect, unspecified: Secondary | ICD-10-CM | POA: Diagnosis not present

## 2017-05-08 DIAGNOSIS — N2581 Secondary hyperparathyroidism of renal origin: Secondary | ICD-10-CM | POA: Diagnosis not present

## 2017-05-08 DIAGNOSIS — E1122 Type 2 diabetes mellitus with diabetic chronic kidney disease: Secondary | ICD-10-CM | POA: Diagnosis not present

## 2017-05-08 DIAGNOSIS — E1129 Type 2 diabetes mellitus with other diabetic kidney complication: Secondary | ICD-10-CM | POA: Diagnosis not present

## 2017-05-08 DIAGNOSIS — D509 Iron deficiency anemia, unspecified: Secondary | ICD-10-CM | POA: Diagnosis not present

## 2017-05-11 DIAGNOSIS — E1129 Type 2 diabetes mellitus with other diabetic kidney complication: Secondary | ICD-10-CM | POA: Diagnosis not present

## 2017-05-11 DIAGNOSIS — D631 Anemia in chronic kidney disease: Secondary | ICD-10-CM | POA: Diagnosis not present

## 2017-05-11 DIAGNOSIS — D509 Iron deficiency anemia, unspecified: Secondary | ICD-10-CM | POA: Diagnosis not present

## 2017-05-11 DIAGNOSIS — D689 Coagulation defect, unspecified: Secondary | ICD-10-CM | POA: Diagnosis not present

## 2017-05-11 DIAGNOSIS — N2581 Secondary hyperparathyroidism of renal origin: Secondary | ICD-10-CM | POA: Diagnosis not present

## 2017-05-11 DIAGNOSIS — N186 End stage renal disease: Secondary | ICD-10-CM | POA: Diagnosis not present

## 2017-05-12 MED FILL — MIDODRINE HCL 10 MG TABLET: 10 | 90 days supply | Qty: 180 | Fill #4

## 2017-05-12 MED FILL — VIMPAT 150 MG TABLET: 150 | 30 days supply | Qty: 60 | Fill #5

## 2017-05-13 DIAGNOSIS — D631 Anemia in chronic kidney disease: Secondary | ICD-10-CM | POA: Diagnosis not present

## 2017-05-13 DIAGNOSIS — D689 Coagulation defect, unspecified: Secondary | ICD-10-CM | POA: Diagnosis not present

## 2017-05-13 DIAGNOSIS — N186 End stage renal disease: Secondary | ICD-10-CM | POA: Diagnosis not present

## 2017-05-13 DIAGNOSIS — N2581 Secondary hyperparathyroidism of renal origin: Secondary | ICD-10-CM | POA: Diagnosis not present

## 2017-05-13 DIAGNOSIS — E1129 Type 2 diabetes mellitus with other diabetic kidney complication: Secondary | ICD-10-CM | POA: Diagnosis not present

## 2017-05-13 DIAGNOSIS — D509 Iron deficiency anemia, unspecified: Secondary | ICD-10-CM | POA: Diagnosis not present

## 2017-05-15 DIAGNOSIS — E1129 Type 2 diabetes mellitus with other diabetic kidney complication: Secondary | ICD-10-CM | POA: Diagnosis not present

## 2017-05-15 DIAGNOSIS — N2581 Secondary hyperparathyroidism of renal origin: Secondary | ICD-10-CM | POA: Diagnosis not present

## 2017-05-15 DIAGNOSIS — N186 End stage renal disease: Secondary | ICD-10-CM | POA: Diagnosis not present

## 2017-05-15 DIAGNOSIS — D631 Anemia in chronic kidney disease: Secondary | ICD-10-CM | POA: Diagnosis not present

## 2017-05-15 DIAGNOSIS — D689 Coagulation defect, unspecified: Secondary | ICD-10-CM | POA: Diagnosis not present

## 2017-05-15 DIAGNOSIS — D509 Iron deficiency anemia, unspecified: Secondary | ICD-10-CM | POA: Diagnosis not present

## 2017-05-17 MED FILL — CONTOUR NEXT STRIPS: 33 days supply | Qty: 100 | Fill #2

## 2017-05-18 DIAGNOSIS — N2581 Secondary hyperparathyroidism of renal origin: Secondary | ICD-10-CM | POA: Diagnosis not present

## 2017-05-18 DIAGNOSIS — D509 Iron deficiency anemia, unspecified: Secondary | ICD-10-CM | POA: Diagnosis not present

## 2017-05-18 DIAGNOSIS — D689 Coagulation defect, unspecified: Secondary | ICD-10-CM | POA: Diagnosis not present

## 2017-05-18 DIAGNOSIS — E1129 Type 2 diabetes mellitus with other diabetic kidney complication: Secondary | ICD-10-CM | POA: Diagnosis not present

## 2017-05-18 DIAGNOSIS — N186 End stage renal disease: Secondary | ICD-10-CM | POA: Diagnosis not present

## 2017-05-18 MED FILL — BREO ELLIPTA 200-25 MCG INH: 200-25 | 30 days supply | Qty: 60 | Fill #4

## 2017-05-20 DIAGNOSIS — D689 Coagulation defect, unspecified: Secondary | ICD-10-CM | POA: Diagnosis not present

## 2017-05-20 DIAGNOSIS — N2581 Secondary hyperparathyroidism of renal origin: Secondary | ICD-10-CM | POA: Diagnosis not present

## 2017-05-20 DIAGNOSIS — N186 End stage renal disease: Secondary | ICD-10-CM | POA: Diagnosis not present

## 2017-05-20 DIAGNOSIS — D509 Iron deficiency anemia, unspecified: Secondary | ICD-10-CM | POA: Diagnosis not present

## 2017-05-20 DIAGNOSIS — E1129 Type 2 diabetes mellitus with other diabetic kidney complication: Secondary | ICD-10-CM | POA: Diagnosis not present

## 2017-05-22 DIAGNOSIS — N2581 Secondary hyperparathyroidism of renal origin: Secondary | ICD-10-CM | POA: Diagnosis not present

## 2017-05-22 DIAGNOSIS — E1129 Type 2 diabetes mellitus with other diabetic kidney complication: Secondary | ICD-10-CM | POA: Diagnosis not present

## 2017-05-22 DIAGNOSIS — D689 Coagulation defect, unspecified: Secondary | ICD-10-CM | POA: Diagnosis not present

## 2017-05-22 DIAGNOSIS — N186 End stage renal disease: Secondary | ICD-10-CM | POA: Diagnosis not present

## 2017-05-22 DIAGNOSIS — D509 Iron deficiency anemia, unspecified: Secondary | ICD-10-CM | POA: Diagnosis not present

## 2017-05-25 DIAGNOSIS — Z23 Encounter for immunization: Secondary | ICD-10-CM | POA: Diagnosis not present

## 2017-05-25 DIAGNOSIS — D689 Coagulation defect, unspecified: Secondary | ICD-10-CM | POA: Diagnosis not present

## 2017-05-25 DIAGNOSIS — N2581 Secondary hyperparathyroidism of renal origin: Secondary | ICD-10-CM | POA: Diagnosis not present

## 2017-05-25 DIAGNOSIS — E1129 Type 2 diabetes mellitus with other diabetic kidney complication: Secondary | ICD-10-CM | POA: Diagnosis not present

## 2017-05-25 DIAGNOSIS — N186 End stage renal disease: Secondary | ICD-10-CM | POA: Diagnosis not present

## 2017-05-25 DIAGNOSIS — D509 Iron deficiency anemia, unspecified: Secondary | ICD-10-CM | POA: Diagnosis not present

## 2017-05-27 DIAGNOSIS — Z23 Encounter for immunization: Secondary | ICD-10-CM | POA: Diagnosis not present

## 2017-05-27 DIAGNOSIS — N186 End stage renal disease: Secondary | ICD-10-CM | POA: Diagnosis not present

## 2017-05-27 DIAGNOSIS — D689 Coagulation defect, unspecified: Secondary | ICD-10-CM | POA: Diagnosis not present

## 2017-05-27 DIAGNOSIS — E1129 Type 2 diabetes mellitus with other diabetic kidney complication: Secondary | ICD-10-CM | POA: Diagnosis not present

## 2017-05-27 DIAGNOSIS — N2581 Secondary hyperparathyroidism of renal origin: Secondary | ICD-10-CM | POA: Diagnosis not present

## 2017-05-27 DIAGNOSIS — D509 Iron deficiency anemia, unspecified: Secondary | ICD-10-CM | POA: Diagnosis not present

## 2017-05-28 DIAGNOSIS — E119 Type 2 diabetes mellitus without complications: Secondary | ICD-10-CM | POA: Diagnosis not present

## 2017-05-28 DIAGNOSIS — Z7682 Awaiting organ transplant status: Secondary | ICD-10-CM | POA: Diagnosis not present

## 2017-05-29 DIAGNOSIS — E1129 Type 2 diabetes mellitus with other diabetic kidney complication: Secondary | ICD-10-CM | POA: Diagnosis not present

## 2017-05-29 DIAGNOSIS — D689 Coagulation defect, unspecified: Secondary | ICD-10-CM | POA: Diagnosis not present

## 2017-05-29 DIAGNOSIS — Z23 Encounter for immunization: Secondary | ICD-10-CM | POA: Diagnosis not present

## 2017-05-29 DIAGNOSIS — D509 Iron deficiency anemia, unspecified: Secondary | ICD-10-CM | POA: Diagnosis not present

## 2017-05-29 DIAGNOSIS — N186 End stage renal disease: Secondary | ICD-10-CM | POA: Diagnosis not present

## 2017-05-29 DIAGNOSIS — N2581 Secondary hyperparathyroidism of renal origin: Secondary | ICD-10-CM | POA: Diagnosis not present

## 2017-06-01 DIAGNOSIS — E1129 Type 2 diabetes mellitus with other diabetic kidney complication: Secondary | ICD-10-CM | POA: Diagnosis not present

## 2017-06-01 DIAGNOSIS — D509 Iron deficiency anemia, unspecified: Secondary | ICD-10-CM | POA: Diagnosis not present

## 2017-06-01 DIAGNOSIS — N186 End stage renal disease: Secondary | ICD-10-CM | POA: Diagnosis not present

## 2017-06-01 DIAGNOSIS — D689 Coagulation defect, unspecified: Secondary | ICD-10-CM | POA: Diagnosis not present

## 2017-06-01 DIAGNOSIS — N2581 Secondary hyperparathyroidism of renal origin: Secondary | ICD-10-CM | POA: Diagnosis not present

## 2017-06-03 DIAGNOSIS — E1129 Type 2 diabetes mellitus with other diabetic kidney complication: Secondary | ICD-10-CM | POA: Diagnosis not present

## 2017-06-03 DIAGNOSIS — D509 Iron deficiency anemia, unspecified: Secondary | ICD-10-CM | POA: Diagnosis not present

## 2017-06-03 DIAGNOSIS — N186 End stage renal disease: Secondary | ICD-10-CM | POA: Diagnosis not present

## 2017-06-03 DIAGNOSIS — N2581 Secondary hyperparathyroidism of renal origin: Secondary | ICD-10-CM | POA: Diagnosis not present

## 2017-06-03 DIAGNOSIS — D689 Coagulation defect, unspecified: Secondary | ICD-10-CM | POA: Diagnosis not present

## 2017-06-05 DIAGNOSIS — N186 End stage renal disease: Secondary | ICD-10-CM | POA: Diagnosis not present

## 2017-06-05 DIAGNOSIS — D689 Coagulation defect, unspecified: Secondary | ICD-10-CM | POA: Diagnosis not present

## 2017-06-05 DIAGNOSIS — D509 Iron deficiency anemia, unspecified: Secondary | ICD-10-CM | POA: Diagnosis not present

## 2017-06-05 DIAGNOSIS — E1129 Type 2 diabetes mellitus with other diabetic kidney complication: Secondary | ICD-10-CM | POA: Diagnosis not present

## 2017-06-05 DIAGNOSIS — N2581 Secondary hyperparathyroidism of renal origin: Secondary | ICD-10-CM | POA: Diagnosis not present

## 2017-06-07 DIAGNOSIS — Z992 Dependence on renal dialysis: Secondary | ICD-10-CM | POA: Diagnosis not present

## 2017-06-07 DIAGNOSIS — E1122 Type 2 diabetes mellitus with diabetic chronic kidney disease: Secondary | ICD-10-CM | POA: Diagnosis not present

## 2017-06-07 DIAGNOSIS — N186 End stage renal disease: Secondary | ICD-10-CM | POA: Diagnosis not present

## 2017-06-08 DIAGNOSIS — D631 Anemia in chronic kidney disease: Secondary | ICD-10-CM | POA: Diagnosis not present

## 2017-06-08 DIAGNOSIS — E1129 Type 2 diabetes mellitus with other diabetic kidney complication: Secondary | ICD-10-CM | POA: Diagnosis not present

## 2017-06-08 DIAGNOSIS — D509 Iron deficiency anemia, unspecified: Secondary | ICD-10-CM | POA: Diagnosis not present

## 2017-06-08 DIAGNOSIS — N186 End stage renal disease: Secondary | ICD-10-CM | POA: Diagnosis not present

## 2017-06-08 DIAGNOSIS — N2581 Secondary hyperparathyroidism of renal origin: Secondary | ICD-10-CM | POA: Diagnosis not present

## 2017-06-08 DIAGNOSIS — D689 Coagulation defect, unspecified: Secondary | ICD-10-CM | POA: Diagnosis not present

## 2017-06-08 MED FILL — CALCIUM ACETATE 667 MG CAP: 667 | 83 days supply | Qty: 1080 | Fill #0

## 2017-06-10 DIAGNOSIS — E1129 Type 2 diabetes mellitus with other diabetic kidney complication: Secondary | ICD-10-CM | POA: Diagnosis not present

## 2017-06-10 DIAGNOSIS — D509 Iron deficiency anemia, unspecified: Secondary | ICD-10-CM | POA: Diagnosis not present

## 2017-06-10 DIAGNOSIS — N2581 Secondary hyperparathyroidism of renal origin: Secondary | ICD-10-CM | POA: Diagnosis not present

## 2017-06-10 DIAGNOSIS — D689 Coagulation defect, unspecified: Secondary | ICD-10-CM | POA: Diagnosis not present

## 2017-06-10 DIAGNOSIS — D631 Anemia in chronic kidney disease: Secondary | ICD-10-CM | POA: Diagnosis not present

## 2017-06-10 DIAGNOSIS — N186 End stage renal disease: Secondary | ICD-10-CM | POA: Diagnosis not present

## 2017-06-12 DIAGNOSIS — D509 Iron deficiency anemia, unspecified: Secondary | ICD-10-CM | POA: Diagnosis not present

## 2017-06-12 DIAGNOSIS — D689 Coagulation defect, unspecified: Secondary | ICD-10-CM | POA: Diagnosis not present

## 2017-06-12 DIAGNOSIS — N2581 Secondary hyperparathyroidism of renal origin: Secondary | ICD-10-CM | POA: Diagnosis not present

## 2017-06-12 DIAGNOSIS — D631 Anemia in chronic kidney disease: Secondary | ICD-10-CM | POA: Diagnosis not present

## 2017-06-12 DIAGNOSIS — N186 End stage renal disease: Secondary | ICD-10-CM | POA: Diagnosis not present

## 2017-06-12 DIAGNOSIS — E1129 Type 2 diabetes mellitus with other diabetic kidney complication: Secondary | ICD-10-CM | POA: Diagnosis not present

## 2017-06-13 MED FILL — lamoTRIgine 150 MG TABS: 150 | 90 days supply | Qty: 180 | Fill #1

## 2017-06-13 MED FILL — METOPROLOL TARTRATE 25 MG T: 25 | 60 days supply | Qty: 60 | Fill #2

## 2017-06-15 ENCOUNTER — Other Ambulatory Visit: Payer: Self-pay | Admitting: Neurology

## 2017-06-15 DIAGNOSIS — E1040 Type 1 diabetes mellitus with diabetic neuropathy, unspecified: Secondary | ICD-10-CM | POA: Diagnosis not present

## 2017-06-15 DIAGNOSIS — D509 Iron deficiency anemia, unspecified: Secondary | ICD-10-CM | POA: Diagnosis not present

## 2017-06-15 DIAGNOSIS — N2581 Secondary hyperparathyroidism of renal origin: Secondary | ICD-10-CM | POA: Diagnosis not present

## 2017-06-15 DIAGNOSIS — D689 Coagulation defect, unspecified: Secondary | ICD-10-CM | POA: Diagnosis not present

## 2017-06-15 DIAGNOSIS — N186 End stage renal disease: Secondary | ICD-10-CM | POA: Diagnosis not present

## 2017-06-15 DIAGNOSIS — E1129 Type 2 diabetes mellitus with other diabetic kidney complication: Secondary | ICD-10-CM | POA: Diagnosis not present

## 2017-06-15 DIAGNOSIS — E162 Hypoglycemia, unspecified: Secondary | ICD-10-CM | POA: Diagnosis not present

## 2017-06-15 MED FILL — VIMPAT 150 MG TABS: 150 | 30 days supply | Qty: 60 | Fill #0

## 2017-06-15 MED FILL — UNIFINE PENTIPS 8MM 31G: 31G X 8 MM | 90 days supply | Qty: 100 | Fill #1

## 2017-06-15 MED FILL — SENSIPAR 60 MG TABLET: 60 | 30 days supply | Qty: 30 | Fill #1

## 2017-06-17 DIAGNOSIS — D509 Iron deficiency anemia, unspecified: Secondary | ICD-10-CM | POA: Diagnosis not present

## 2017-06-17 DIAGNOSIS — N2581 Secondary hyperparathyroidism of renal origin: Secondary | ICD-10-CM | POA: Diagnosis not present

## 2017-06-17 DIAGNOSIS — E1129 Type 2 diabetes mellitus with other diabetic kidney complication: Secondary | ICD-10-CM | POA: Diagnosis not present

## 2017-06-17 DIAGNOSIS — D689 Coagulation defect, unspecified: Secondary | ICD-10-CM | POA: Diagnosis not present

## 2017-06-17 DIAGNOSIS — N186 End stage renal disease: Secondary | ICD-10-CM | POA: Diagnosis not present

## 2017-06-19 DIAGNOSIS — N2581 Secondary hyperparathyroidism of renal origin: Secondary | ICD-10-CM | POA: Diagnosis not present

## 2017-06-19 DIAGNOSIS — N186 End stage renal disease: Secondary | ICD-10-CM | POA: Diagnosis not present

## 2017-06-19 DIAGNOSIS — E1129 Type 2 diabetes mellitus with other diabetic kidney complication: Secondary | ICD-10-CM | POA: Diagnosis not present

## 2017-06-19 DIAGNOSIS — D509 Iron deficiency anemia, unspecified: Secondary | ICD-10-CM | POA: Diagnosis not present

## 2017-06-19 DIAGNOSIS — D689 Coagulation defect, unspecified: Secondary | ICD-10-CM | POA: Diagnosis not present

## 2017-06-22 DIAGNOSIS — D509 Iron deficiency anemia, unspecified: Secondary | ICD-10-CM | POA: Diagnosis not present

## 2017-06-22 DIAGNOSIS — N2581 Secondary hyperparathyroidism of renal origin: Secondary | ICD-10-CM | POA: Diagnosis not present

## 2017-06-22 DIAGNOSIS — E1129 Type 2 diabetes mellitus with other diabetic kidney complication: Secondary | ICD-10-CM | POA: Diagnosis not present

## 2017-06-22 DIAGNOSIS — N186 End stage renal disease: Secondary | ICD-10-CM | POA: Diagnosis not present

## 2017-06-22 DIAGNOSIS — D689 Coagulation defect, unspecified: Secondary | ICD-10-CM | POA: Diagnosis not present

## 2017-06-22 DIAGNOSIS — D631 Anemia in chronic kidney disease: Secondary | ICD-10-CM | POA: Diagnosis not present

## 2017-06-23 ENCOUNTER — Telehealth: Payer: Self-pay | Admitting: Oncology

## 2017-06-23 ENCOUNTER — Telehealth: Payer: Self-pay | Admitting: *Deleted

## 2017-06-23 ENCOUNTER — Encounter: Payer: Self-pay | Admitting: *Deleted

## 2017-06-23 NOTE — Telephone Encounter (Signed)
Spoke with patient's wife and got him scheduled on 10/26 around his dialysis

## 2017-06-23 NOTE — Telephone Encounter (Signed)
Patient's wife calling to schedule follow-up appt with Dr Alen Blew.

## 2017-06-24 ENCOUNTER — Encounter: Payer: Self-pay | Admitting: *Deleted

## 2017-06-24 ENCOUNTER — Ambulatory Visit: Payer: Self-pay | Admitting: Oncology

## 2017-06-24 ENCOUNTER — Other Ambulatory Visit: Payer: Self-pay

## 2017-06-24 DIAGNOSIS — N186 End stage renal disease: Secondary | ICD-10-CM | POA: Diagnosis not present

## 2017-06-24 DIAGNOSIS — D689 Coagulation defect, unspecified: Secondary | ICD-10-CM | POA: Diagnosis not present

## 2017-06-24 DIAGNOSIS — N2581 Secondary hyperparathyroidism of renal origin: Secondary | ICD-10-CM | POA: Diagnosis not present

## 2017-06-24 DIAGNOSIS — D509 Iron deficiency anemia, unspecified: Secondary | ICD-10-CM | POA: Diagnosis not present

## 2017-06-24 DIAGNOSIS — D631 Anemia in chronic kidney disease: Secondary | ICD-10-CM | POA: Diagnosis not present

## 2017-06-24 DIAGNOSIS — E1129 Type 2 diabetes mellitus with other diabetic kidney complication: Secondary | ICD-10-CM | POA: Diagnosis not present

## 2017-06-26 DIAGNOSIS — D631 Anemia in chronic kidney disease: Secondary | ICD-10-CM | POA: Diagnosis not present

## 2017-06-26 DIAGNOSIS — D509 Iron deficiency anemia, unspecified: Secondary | ICD-10-CM | POA: Diagnosis not present

## 2017-06-26 DIAGNOSIS — D689 Coagulation defect, unspecified: Secondary | ICD-10-CM | POA: Diagnosis not present

## 2017-06-26 DIAGNOSIS — N2581 Secondary hyperparathyroidism of renal origin: Secondary | ICD-10-CM | POA: Diagnosis not present

## 2017-06-26 DIAGNOSIS — N186 End stage renal disease: Secondary | ICD-10-CM | POA: Diagnosis not present

## 2017-06-26 DIAGNOSIS — E1129 Type 2 diabetes mellitus with other diabetic kidney complication: Secondary | ICD-10-CM | POA: Diagnosis not present

## 2017-06-29 DIAGNOSIS — N2581 Secondary hyperparathyroidism of renal origin: Secondary | ICD-10-CM | POA: Diagnosis not present

## 2017-06-29 DIAGNOSIS — E1129 Type 2 diabetes mellitus with other diabetic kidney complication: Secondary | ICD-10-CM | POA: Diagnosis not present

## 2017-06-29 DIAGNOSIS — D509 Iron deficiency anemia, unspecified: Secondary | ICD-10-CM | POA: Diagnosis not present

## 2017-06-29 DIAGNOSIS — N186 End stage renal disease: Secondary | ICD-10-CM | POA: Diagnosis not present

## 2017-06-29 DIAGNOSIS — D689 Coagulation defect, unspecified: Secondary | ICD-10-CM | POA: Diagnosis not present

## 2017-07-01 DIAGNOSIS — E1129 Type 2 diabetes mellitus with other diabetic kidney complication: Secondary | ICD-10-CM | POA: Diagnosis not present

## 2017-07-01 DIAGNOSIS — N2581 Secondary hyperparathyroidism of renal origin: Secondary | ICD-10-CM | POA: Diagnosis not present

## 2017-07-01 DIAGNOSIS — D689 Coagulation defect, unspecified: Secondary | ICD-10-CM | POA: Diagnosis not present

## 2017-07-01 DIAGNOSIS — D509 Iron deficiency anemia, unspecified: Secondary | ICD-10-CM | POA: Diagnosis not present

## 2017-07-01 DIAGNOSIS — N186 End stage renal disease: Secondary | ICD-10-CM | POA: Diagnosis not present

## 2017-07-03 ENCOUNTER — Ambulatory Visit (HOSPITAL_BASED_OUTPATIENT_CLINIC_OR_DEPARTMENT_OTHER): Payer: 59 | Admitting: Oncology

## 2017-07-03 ENCOUNTER — Telehealth: Payer: Self-pay | Admitting: Oncology

## 2017-07-03 ENCOUNTER — Other Ambulatory Visit (HOSPITAL_BASED_OUTPATIENT_CLINIC_OR_DEPARTMENT_OTHER): Payer: 59

## 2017-07-03 VITALS — BP 155/87 | HR 70 | Temp 98.6°F | Resp 16 | Ht 70.0 in | Wt 219.9 lb

## 2017-07-03 DIAGNOSIS — N189 Chronic kidney disease, unspecified: Secondary | ICD-10-CM

## 2017-07-03 DIAGNOSIS — Z992 Dependence on renal dialysis: Secondary | ICD-10-CM

## 2017-07-03 DIAGNOSIS — D689 Coagulation defect, unspecified: Secondary | ICD-10-CM | POA: Diagnosis not present

## 2017-07-03 DIAGNOSIS — E8809 Other disorders of plasma-protein metabolism, not elsewhere classified: Secondary | ICD-10-CM

## 2017-07-03 DIAGNOSIS — I10 Essential (primary) hypertension: Secondary | ICD-10-CM | POA: Diagnosis not present

## 2017-07-03 DIAGNOSIS — E119 Type 2 diabetes mellitus without complications: Secondary | ICD-10-CM | POA: Diagnosis not present

## 2017-07-03 DIAGNOSIS — D472 Monoclonal gammopathy: Secondary | ICD-10-CM

## 2017-07-03 DIAGNOSIS — D509 Iron deficiency anemia, unspecified: Secondary | ICD-10-CM | POA: Diagnosis not present

## 2017-07-03 DIAGNOSIS — N186 End stage renal disease: Secondary | ICD-10-CM | POA: Diagnosis not present

## 2017-07-03 DIAGNOSIS — Z8673 Personal history of transient ischemic attack (TIA), and cerebral infarction without residual deficits: Secondary | ICD-10-CM | POA: Diagnosis not present

## 2017-07-03 DIAGNOSIS — D631 Anemia in chronic kidney disease: Secondary | ICD-10-CM

## 2017-07-03 DIAGNOSIS — E1129 Type 2 diabetes mellitus with other diabetic kidney complication: Secondary | ICD-10-CM | POA: Diagnosis not present

## 2017-07-03 DIAGNOSIS — N2581 Secondary hyperparathyroidism of renal origin: Secondary | ICD-10-CM | POA: Diagnosis not present

## 2017-07-03 DIAGNOSIS — Z005 Encounter for examination of potential donor of organ and tissue: Secondary | ICD-10-CM | POA: Diagnosis not present

## 2017-07-03 LAB — CBC WITH DIFFERENTIAL/PLATELET
BASO%: 0.8 % (ref 0.0–2.0)
Basophils Absolute: 0.1 10*3/uL (ref 0.0–0.1)
EOS%: 3.1 % (ref 0.0–7.0)
Eosinophils Absolute: 0.3 10*3/uL (ref 0.0–0.5)
HCT: 30.4 % — ABNORMAL LOW (ref 38.4–49.9)
HGB: 10.9 g/dL — ABNORMAL LOW (ref 13.0–17.1)
LYMPH%: 21.7 % (ref 14.0–49.0)
MCH: 30.8 pg (ref 27.2–33.4)
MCHC: 35.9 g/dL (ref 32.0–36.0)
MCV: 85.9 fL (ref 79.3–98.0)
MONO#: 1.2 10*3/uL — ABNORMAL HIGH (ref 0.1–0.9)
MONO%: 11.7 % (ref 0.0–14.0)
NEUT#: 6.6 10*3/uL — ABNORMAL HIGH (ref 1.5–6.5)
NEUT%: 62.7 % (ref 39.0–75.0)
Platelets: 355 10*3/uL (ref 140–400)
RBC: 3.54 10*6/uL — ABNORMAL LOW (ref 4.20–5.82)
RDW: 17.6 % — ABNORMAL HIGH (ref 11.0–14.6)
WBC: 10.5 10*3/uL — ABNORMAL HIGH (ref 4.0–10.3)
lymph#: 2.3 10*3/uL (ref 0.9–3.3)
nRBC: 2 % — ABNORMAL HIGH (ref 0–0)

## 2017-07-03 LAB — COMPREHENSIVE METABOLIC PANEL
ALT: 14 U/L (ref 0–55)
AST: 16 U/L (ref 5–34)
Albumin: 4 g/dL (ref 3.5–5.0)
Alkaline Phosphatase: 77 U/L (ref 40–150)
Anion Gap: 12 mEq/L — ABNORMAL HIGH (ref 3–11)
BUN: 46.4 mg/dL — ABNORMAL HIGH (ref 7.0–26.0)
CO2: 27 mEq/L (ref 22–29)
Calcium: 9.3 mg/dL (ref 8.4–10.4)
Chloride: 99 mEq/L (ref 98–109)
Creatinine: 10.9 mg/dL (ref 0.7–1.3)
EGFR: 6 mL/min/{1.73_m2} — ABNORMAL LOW (ref >60)
Glucose: 76 mg/dl (ref 70–140)
Potassium: 4.2 mEq/L (ref 3.5–5.1)
Sodium: 138 mEq/L (ref 136–145)
Total Bilirubin: 0.93 mg/dL (ref 0.20–1.20)
Total Protein: 10.6 g/dL — ABNORMAL HIGH (ref 6.4–8.3)

## 2017-07-03 LAB — TECHNOLOGIST REVIEW

## 2017-07-03 NOTE — Progress Notes (Signed)
Hematology and Oncology Follow Up Visit  [place] [id number] 1966-04-06 51 y.o. 07/03/2017 8:48 AM    Principle Diagnosis: 51 year old gentleman with the following diagnoses:   1.  Smoldering myeloma: He presented with monoclonal gammopathy in 2012; IgG kappa MGUS and  M spike about 1 g/dL and bone marrow biopsy showed 7% plasma cell involvement without evidence of myeloma in 12/2010. He did not have any lytic bone lesions. Repeat a bone marrow biopsy in 10/16/2014 showed 13% plasma cell infiltration and no lytic bone lesions on his skeletal survey. Bone marrow biopsy in March 2018 showed close to 20% plasma cell infiltration.  2. Multifactorial anemia: The etiology includes anemia of renal disease, plasma cell disorder, myeloproliferative disorder, and GI bleeding.   3. Chronic renal failure hemodialysis-dependent are not related that the plasma cell disorder. Renal failure is related to long-standing diabetes and hypertension.  4. He is status post CVA and right-sided weakness and speech difficulties in February 2017.  Interim History:  [name] presents today for a followup visit with his wife. Since the last visit, he continues to report significant improvement in his health.  His performance status and activity level continues to be excellent.  He continues to receive dialysis Monday when Friday and he is on a transplant list for potential renal transplant. He does not report any recent seizures or hospitalizations. He does not report any falls or syncope. He continues to tolerate hemodialysis on Monday   He denies any bone pain, pathological fractures or recurrent infections.  He denies any constitutional symptoms or failure to thrive.  His performance status and quality of life remained intact.  He does not report any headaches, blurry vision, syncope. He does not report any fevers, chills, sweats or weight loss. He does not report any  palpitation, orthopnea or leg edema. Does not  report any cough or hemoptysis or hematemesis. He does not report any nausea, vomiting. He does not report any frequency, urgency or hesitancy. Rest of his review of systems unremarkable.   Medications: I have reviewed the patient's current medications.  Current Outpatient Prescriptions:  .  albuterol (PROAIR HFA) 108 (90 Base) MCG/ACT inhaler, Inhale two puffs every 4-6 hours if needed for cough or wheeze, Disp: 1 Inhaler, Rfl: 1 .  aspirin EC 81 MG tablet, Take 1 tablet (81 mg total) by mouth daily., Disp: 100 tablet, Rfl: 0 .  BAYER CONTOUR NEXT TEST test strip, 1 each by Other route as directed. , Disp: , Rfl: 99 .  calcium acetate (PHOSLO) 667 MG capsule, Take 3 capsules by mouth 3 (three) times daily with meals. , Disp: , Rfl:  .  Cetirizine HCl 10 MG CAPS, Take 1 capsule by mouth daily., Disp: , Rfl:  .  cinacalcet (SENSIPAR) 60 MG tablet, Take 60 mg by mouth every other day. , Disp: , Rfl:  .  fluticasone furoate-vilanterol (BREO ELLIPTA) 100-25 MCG/INH AEPB, Inhale one puff once daily to prevent cough or wheeze, Disp: 60 each, Rfl: 5 .  insulin glargine (LANTUS) 100 unit/mL SOPN, Inject 12 Units into the skin at bedtime., Disp: , Rfl:  .  lamoTRIgine (LAMICTAL) 150 MG tablet, Take 1 tablet (150 mg total) by mouth 2 (two) times daily., Disp: 180 tablet, Rfl: 1 .  metoprolol tartrate (LOPRESSOR) 25 MG tablet, Take 0.5 tablets (12.5 mg total) by mouth 2 (two) times daily., Disp: 60 tablet, Rfl: 0 .  midodrine (PROAMATINE) 10 MG tablet, Take 10 mg by mouth 2 (two) times daily  with a meal. , Disp: , Rfl:  .  multivitamin (RENA-VIT) TABS tablet, Take 1 tablet by mouth at bedtime., Disp: 30 tablet, Rfl: 1 .  pantoprazole (PROTONIX) 40 MG tablet, Take 1 tablet (40 mg total) by mouth 2 (two) times daily., Disp: 60 tablet, Rfl: 1 .  VIMPAT 150 MG TABS, TAKE 1 TABLET BY MOUTH TWICE DAILY, Disp: 60 tablet, Rfl: 12  Allergies:  Allergies  Allergen Reactions  . Codeine Rash and Other (See Comments)     Unknown reaction (patient says it was more serious than just a rash, but he can't remember what happened)    Past Medical History, Surgical history, Social history, and Family History were reviewed and updated.  Physical Exam: Blood pressure (!) 155/87, pulse 70, temperature 98.6 F (37 C), temperature source Oral, resp. rate 16, height 5' 10"  (1.778 m), weight 219 lb 14.4 oz (99.7 kg), SpO2 100 %. ECOG: 1 General appearance: Alert, awake gentleman without distress. Head: Normocephalic, without obvious abnormality no oral ulcers or lesions. Neck: no adenopathy Lymph nodes: Cervical, supraclavicular, and axillary nodes normal. Heart:regular rate and rhythm, S1, S2 normal, no murmur, click, rub or gallop Lung:chest clear, no wheezing, rales, normal symmetric air entry. No shifting dullness or ascites. Abdomin: soft, non-tender, without masses or organomegaly.  EXT:no erythema, induration, or nodules He is able to ambulate without any difficulties. No neurological deficits noted.  Lab Results: Lab Results  Component Value Date   WBC 10.5 (H) 07/03/2017   HGB 10.9 (L) 07/03/2017   HCT 30.4 (L) 07/03/2017   MCV 85.9 07/03/2017   PLT 355 07/03/2017     Chemistry      Component Value Date/Time   NA 135 01/22/2017 0929   NA 136 10/14/2016 0943   K 3.4 (L) 01/22/2017 0929   K 3.7 10/14/2016 0943   CL 98 (L) 11/13/2016 0925   CO2 29 11/13/2016 0925   CO2 27 10/14/2016 0943   BUN 39 (H) 11/13/2016 0925   BUN 36.2 (H) 10/14/2016 0943   CREATININE 8.48 (H) 11/13/2016 0925   CREATININE 9.0 (HH) 10/14/2016 0943      Component Value Date/Time   CALCIUM 9.3 11/13/2016 0925   CALCIUM 9.0 10/14/2016 0943   ALKPHOS 96 10/14/2016 0943   AST 15 10/14/2016 0943   ALT 12 10/14/2016 0943   BILITOT 0.93 10/14/2016 0943      Results for MAXEMILIANO, RIEL (MRN [id number]) as of 07/03/2017 08:35  Ref. Range 04/08/2016 09:18 07/08/2016 09:21 10/14/2016 09:43  M Protein SerPl Elph-Mcnc Latest  Ref Range: Not Observed g/dL 2.5 (H) 2.5 (H) 3.4 (H)   Results for LADARRIAN, ASENCIO (MRN [id number]) as of 07/03/2017 08:35  Ref. Range 04/08/2016 09:18 07/08/2016 09:21 10/14/2016 09:43  IgG (Immunoglobin G), Serum Latest Ref Range: 700 - 1600 mg/dL 3,556 (H) 3,422 (H) 4,168 (H)         Impression and Plan:  51 year old gentleman with the following issues:   1. Monoclonal gammopathy, IgG subtype representing monoclonal gammopathy of undetermined significance versus smoldering multiple myeloma. He had 2 bone marrow biopsies done which showed plasma cell disorder related between 7-13%. His IgG level continued to be relatively stable in the last 3 years around 2600-2800.   Repeat protein studies in February 2018 showed slight increase in his overall M spike and IgG level.  Bone marrow biopsy obtained on 11/13/2016 which indicate increase in his plasma cell percentage close to 20%. These findings were suggest evolving smoldering myeloma from MGUS.  The natural course of this disease was reviewed with the patient and his wife.  He understands that he is at increased risk of evolving into multiple myeloma but his disease has been rather slow since 2012. Risks and benefits of active until myeloma treatment were discussed today and he opted to continue with observation and surveillance at this time.  The plan is to repeat protein studies and skeletal survey in May 2019 and potentially repeat bone marrow biopsy at that time.   2. Anemia, which is multifactorial and definitely has an element of hemoglobinopathy. He will be receiving growth factor support with hemodialysis. His last hemoglobin remains stable at this time.  3. Renal insufficiency: Likely related to long-standing hypertension and diabetes and less likely a plasma cell disorder. He is receiving hemodialysis at this time. He is under evaluation for possible kidney transplant.   4. Recurrent thrombosis: He is off anticoagulation at this  time because of recurrent GI bleed..  5. Follow-up: Will be in 4 months for surveillance of his plasma cell disorder.    Zola Button, MD 10/26/20188:48 AM

## 2017-07-03 NOTE — Telephone Encounter (Signed)
Scheduled appt per 10/26 los - Gave patient AVS and calender per los.

## 2017-07-05 ENCOUNTER — Emergency Department (HOSPITAL_COMMUNITY)
Admission: EM | Admit: 2017-07-05 | Discharge: 2017-07-06 | Disposition: A | Discharge status: Home / Self Care | Payer: 59 | Attending: Emergency Medicine | Admitting: Emergency Medicine

## 2017-07-05 DIAGNOSIS — I120 Hypertensive chronic kidney disease with stage 5 chronic kidney disease or end stage renal disease: Secondary | ICD-10-CM | POA: Insufficient documentation

## 2017-07-05 DIAGNOSIS — Z7982 Long term (current) use of aspirin: Secondary | ICD-10-CM | POA: Diagnosis not present

## 2017-07-05 DIAGNOSIS — E1122 Type 2 diabetes mellitus with diabetic chronic kidney disease: Secondary | ICD-10-CM | POA: Insufficient documentation

## 2017-07-05 DIAGNOSIS — Z794 Long term (current) use of insulin: Secondary | ICD-10-CM | POA: Insufficient documentation

## 2017-07-05 DIAGNOSIS — N186 End stage renal disease: Secondary | ICD-10-CM | POA: Insufficient documentation

## 2017-07-05 DIAGNOSIS — R251 Tremor, unspecified: Secondary | ICD-10-CM | POA: Diagnosis not present

## 2017-07-05 DIAGNOSIS — R569 Unspecified convulsions: Secondary | ICD-10-CM | POA: Diagnosis not present

## 2017-07-05 DIAGNOSIS — Z992 Dependence on renal dialysis: Secondary | ICD-10-CM | POA: Diagnosis not present

## 2017-07-05 NOTE — ED Provider Notes (Signed)
[facility] EMERGENCY DEPARTMENT Provider Note   CSN: [id number] Arrival date & time: 07/05/17  2227     History   Chief Complaint Chief Complaint  Patient presents with  . Seizures    HPI Isaiah [name] is a 51 y.o. male.  Patient with previous stroke, ESRD on dialysis, DM, seizure disorder presenting with "mini seizure" tonight while he was watching TV.  States he felt his eyes roll back into his head and had trembling of his arms.  Did not fall or hit head. Did not bite tongue or have incontinence.  States compliance with vimpat and lamictal. Back to baseline now. No fever or recent illnesses.  States no recent medication changes. Last seizure was in January 2018. R sided weakness at baseline. Denies symptoms and feels back to baseline now.no headache or vision changes.   The history is provided by the patient and the spouse.  Seizures   Pertinent negatives include no headaches, no visual disturbance, no chest pain, no cough, no nausea and no vomiting.    Past Medical History:  Diagnosis Date  . A-fib ([place])   . Acute renal failure ([place]) 07/2011  . Anemia   . Asthma   . Diabetes mellitus without complication ([place])   . ESRD (end stage renal disease) on dialysis ([place])   . Gout   . History of recent blood transfusion 10/27/14   2 Units PRBC's  . Hyperkalemia 07/2011  . Hypertension   . Monoclonal gammopathies   . Monoclonal gammopathy   . OSA on CPAP   . Pulmonary embolism ([place]) 07/2011   a. Tx with Coumadin for 6 months (unknown cause per patient).  . Pulmonary embolism ([place]) 07/2011; 09/27/2014   a. Bilat PE 07/2011 - unclear cause, tx with 6 months Coumadin.;   . Seizures ([place])   . Sickle cell-thalassemia disease ([place])    a. Sickle cell trait.  . Sleep apnea    a. uses CPAP.  [name] Stroke [facility])     Patient Active Problem List   Diagnosis Date Noted  . Iron deficiency anemia, unspecified 01/22/2017  . Hemiparesis affecting right side as late  effect of cerebrovascular accident ([place]) 02/28/2016  . Aphasia complicating stroke 49/44/9675  . Acute encephalopathy 01/08/2016  . Seizures ([place]) 01/08/2016  . History of ischemic left MCA stroke 01/08/2016  . Right sided weakness 01/08/2016  . Chronic combined systolic and diastolic CHF (congestive heart failure) ([place]) 01/08/2016  . Atrial fibrillation ([place]) 01/08/2016  . Leukocytosis 01/08/2016  . Benign essential HTN 01/08/2016  . Anemia of chronic disease 01/08/2016  . Controlled type 2 diabetes mellitus with diabetic nephropathy, without long-term current use of insulin ([place]) 01/08/2016  . History of DVT (deep vein thrombosis) 01/08/2016  . ESRD (end stage renal disease) on dialysis [name])     Past Surgical History:  Procedure Laterality Date  . AV FISTULA PLACEMENT Right 12/05/2015   Procedure: INSERTION OF ARTERIOVENOUS (AV) GORE-TEX GRAFT ARM;  Surgeon: Elam Dutch, MD;  Location: [facility];  Service: Vascular;  Laterality: Right;  . BONE MARROW BIOPSY    . CHOLECYSTECTOMY    . CHOLECYSTECTOMY  1990's?  . COLONOSCOPY WITH PROPOFOL N/A 01/22/2017   Procedure: COLONOSCOPY WITH PROPOFOL;  Surgeon: Wilford Corner, MD;  Location: [facility] ENDOSCOPY;  Service: Endoscopy;  Laterality: N/A;  . EYE SURGERY Right   . INSERTION OF DIALYSIS CATHETER Right 10/16/2015   Procedure: INSERTION OF PALINDROME DIALYSIS CATHETER ;  Surgeon: Elam Dutch, MD;  Location: [facility] OR;  Service: Vascular;  Laterality: Right;  . OTHER SURGICAL HISTORY     Retinal surgery  . PARS PLANA VITRECTOMY  02/17/2012   Procedure: PARS PLANA VITRECTOMY WITH 25 GAUGE;  Surgeon: Hayden Pedro, MD;  Location: [place];  Service: Ophthalmology;  Laterality: Right;  . PERIPHERAL VASCULAR CATHETERIZATION N/A 03/20/2016   Procedure: A/V Shuntogram/Fistulagram;  Surgeon: Conrad Vina, MD;  Location: [place] CV LAB;  Service: Cardiovascular;  Laterality: N/A;  . TEE WITHOUT CARDIOVERSION N/A 12/02/2016   Procedure:  TRANSESOPHAGEAL ECHOCARDIOGRAM (TEE);  Surgeon: Larey Dresser, MD;  Location: [place];  Service: Cardiovascular;  Laterality: N/A;       Home Medications    Prior to Admission medications   Medication Sig Start Date End Date Taking? Authorizing Provider  albuterol (PROAIR HFA) 108 (90 Base) MCG/ACT inhaler Inhale two puffs every 4-6 hours if needed for cough or wheeze 04/08/16   Kozlow, Donnamarie Poag, MD  aspirin EC 81 MG tablet Take 1 tablet (81 mg total) by mouth daily. 11/02/15   Love, Ivan Anchors, PA-C  BAYER CONTOUR NEXT TEST test strip 1 each by Other route as directed.  10/24/16   [provider]  calcium acetate (PHOSLO) 667 MG capsule Take 3 capsules by mouth 3 (three) times daily with meals.  11/24/16   [provider]  Cetirizine HCl 10 MG CAPS Take 1 capsule by mouth daily.    [provider]  cinacalcet (SENSIPAR) 60 MG tablet Take 60 mg by mouth every other day.  12/27/15   [provider]  fluticasone furoate-vilanterol (BREO ELLIPTA) 100-25 MCG/INH AEPB Inhale one puff once daily to prevent cough or wheeze 04/08/16   Kozlow, Donnamarie Poag, MD  insulin glargine (LANTUS) 100 unit/mL SOPN Inject 12 Units into the skin at bedtime.    [provider]  lamoTRIgine (LAMICTAL) 150 MG tablet Take 1 tablet (150 mg total) by mouth 2 (two) times daily. 01/29/17   Dennie Bible, NP  metoprolol tartrate (LOPRESSOR) 25 MG tablet Take 0.5 tablets (12.5 mg total) by mouth 2 (two) times daily. 01/10/16   [name], MD  midodrine (PROAMATINE) 10 MG tablet Take 10 mg by mouth 2 (two) times daily with a meal.     [provider]  multivitamin (RENA-VIT) TABS tablet Take 1 tablet by mouth at bedtime. 11/02/15   Love, Ivan Anchors, PA-C  pantoprazole (PROTONIX) 40 MG tablet Take 1 tablet (40 mg total) by mouth 2 (two) times daily. 11/02/15   Love, Ivan Anchors, PA-C  VIMPAT 150 MG TABS TAKE 1 TABLET BY MOUTH TWICE DAILY 06/15/17   Garvin Fila, MD    Family  History Family History  Problem Relation Age of Onset  . Hypertension Mother   . Diabetes Mother   . Stroke Father   . Sickle cell anemia Brother   . Diabetes type I Brother   . Kidney disease Brother     Social History Social History  Substance Use Topics  . Smoking status: Never Smoker  . Smokeless tobacco: Never Used  . Alcohol use No     Allergies   Codeine   Review of Systems Review of Systems  Constitutional: Negative for activity change, appetite change and fever.  HENT: Negative for congestion and nosebleeds.   Eyes: Negative for photophobia and visual disturbance.  Respiratory: Negative for cough, chest tightness and shortness of breath.   Cardiovascular: Negative for chest pain.  Gastrointestinal: Negative for abdominal pain,  nausea and vomiting.  Genitourinary: Negative for dysuria, hematuria and urgency.  Musculoskeletal: Negative for arthralgias and myalgias.  Skin: Negative for rash.  Neurological: Positive for seizures. Negative for dizziness, weakness and headaches.   all other systems are negative except as noted in the HPI and PMH.     Physical Exam Updated Vital Signs BP (!) 143/83 (BP Location: Left Arm)   Pulse 86   Temp 98.8 F (37.1 C) (Oral)   Ht 5' 10"  (1.778 m)   Wt 99.8 kg (220 lb)   SpO2 96%   BMI 31.57 kg/m   Physical Exam  Constitutional: He is oriented to person, place, and time. He appears well-developed and well-nourished. No distress.  HENT:  Head: Normocephalic and atraumatic.  Mouth/Throat: Oropharynx is clear and moist. No oropharyngeal exudate.  Eyes: Pupils are equal, round, and reactive to light. Conjunctivae and EOM are normal.  Neck: Normal range of motion. Neck supple.  No meningismus.  Cardiovascular: Normal rate, regular rhythm, normal heart sounds and intact distal pulses.   No murmur heard. Pulmonary/Chest: Effort normal and breath sounds normal. No respiratory distress.  Abdominal: Soft. There is no  tenderness. There is no rebound and no guarding.  Musculoskeletal: Normal range of motion. He exhibits no edema or tenderness.  Neurological: He is alert and oriented to person, place, and time. No cranial nerve deficit. He exhibits normal muscle tone. Coordination normal.  R facial droop. R sided weakness at basaeline. 5/5 strength on L.. CN 2-12 intact.Equal grip strength.   Skin: Skin is warm. Capillary refill takes less than 2 seconds.  Psychiatric: He has a normal mood and affect. His behavior is normal.  Nursing note and vitals reviewed.    ED Treatments / Results  Labs (all labs ordered are listed, but only abnormal results are displayed) Labs Reviewed  CBC WITH DIFFERENTIAL/PLATELET - Abnormal; Notable for the following:       Result Value   WBC 12.0 (*)    RBC 3.41 (*)    Hemoglobin 10.6 (*)    HCT 29.1 (*)    MCHC 36.4 (*)    RDW 17.4 (*)    Neutro Abs 8.8 (*)    Monocytes Absolute 1.2 (*)    All other components within normal limits  COMPREHENSIVE METABOLIC PANEL - Abnormal; Notable for the following:    Potassium 5.5 (*)    Chloride 99 (*)    Glucose, Bld 139 (*)    BUN 51 (*)    Creatinine, Ser 12.54 (*)    Calcium 8.5 (*)    Total Protein 9.5 (*)    GFR calc non Af Amer 4 (*)    GFR calc Af Amer 5 (*)    All other components within normal limits    EKG  EKG Interpretation None       Radiology No results found.  Procedures Procedures (including critical care time)  Medications Ordered in ED Medications - No data to display   Initial Impression / Assessment and Plan / ED Course  I have reviewed the triage vital signs and the nursing notes.  Pertinent labs & imaging results that were available during my care of the patient were reviewed by me and considered in my medical decision making (see chart for details).    Patient presents with breakthrough seizure.  Back to baseline now.  No recent illnesses.  States compliance with medications.   Right-sided weakness at baseline.  We will check labs.  Patient declines head CT.  Denies head trauma.  Discussed with Dr. Lorraine Lax from neurology who agrees with brief episode patient his medications can be kept the same and he can call his neurologist in the morning.  Initial potassium mildly elevated and hemolyzed at 5.5.  Advised to recheck.  Patient refuses and states he is ready to go home.  Also refuses EKG to assess for changes of hyperkalemia.  States he is due for dialysis tomorrow.  Patient does not want to have potassium rechecked and wishes to leave now.  He is back to baseline.  He appears to have capacity to refuse additional testing.  He has dialysis later today and is advised to call his neurologist for further adjustments of his seizure medications.  Return precautions discussed   Final Clinical Impressions(s) / ED Diagnoses   Final diagnoses:  Seizure-like activity [facility])    New Prescriptions New Prescriptions   No medications on file     Ezequiel Essex, MD 07/06/17 [phone]

## 2017-07-05 NOTE — ED Triage Notes (Signed)
Per ems pt was at home watching tv and he felt himself starting to shake. Per pt's family she walked in after the fact and pt had eyes rolled back in his head and was coming to. Pt stated he felt the seizure began. Did not fall was sitting on the couch. Pt stated he has not had a seizure in 1 1/2 years. 158/64, p 84, 100% cbg 147, rr 18.

## 2017-07-05 NOTE — ED Notes (Signed)
Patient transported to CT 

## 2017-07-06 ENCOUNTER — Telehealth: Payer: Self-pay | Admitting: Nurse Practitioner

## 2017-07-06 DIAGNOSIS — D631 Anemia in chronic kidney disease: Secondary | ICD-10-CM | POA: Diagnosis not present

## 2017-07-06 DIAGNOSIS — D509 Iron deficiency anemia, unspecified: Secondary | ICD-10-CM | POA: Diagnosis not present

## 2017-07-06 DIAGNOSIS — E1129 Type 2 diabetes mellitus with other diabetic kidney complication: Secondary | ICD-10-CM | POA: Diagnosis not present

## 2017-07-06 DIAGNOSIS — D689 Coagulation defect, unspecified: Secondary | ICD-10-CM | POA: Diagnosis not present

## 2017-07-06 DIAGNOSIS — N2581 Secondary hyperparathyroidism of renal origin: Secondary | ICD-10-CM | POA: Diagnosis not present

## 2017-07-06 DIAGNOSIS — N186 End stage renal disease: Secondary | ICD-10-CM | POA: Diagnosis not present

## 2017-07-06 LAB — COMPREHENSIVE METABOLIC PANEL
ALT: 22 U/L (ref 17–63)
AST: 35 U/L (ref 15–41)
Albumin: 3.7 g/dL (ref 3.5–5.0)
Alkaline Phosphatase: 64 U/L (ref 38–126)
Anion gap: 12 (ref 5–15)
BUN: 51 mg/dL — ABNORMAL HIGH (ref 6–20)
CO2: 25 mmol/L (ref 22–32)
Calcium: 8.5 mg/dL — ABNORMAL LOW (ref 8.9–10.3)
Chloride: 99 mmol/L — ABNORMAL LOW (ref 101–111)
Creatinine, Ser: 12.54 mg/dL — ABNORMAL HIGH (ref 0.61–1.24)
GFR calc Af Amer: 5 mL/min — ABNORMAL LOW (ref >60)
GFR calc non Af Amer: 4 mL/min — ABNORMAL LOW (ref >60)
Glucose, Bld: 139 mg/dL — ABNORMAL HIGH (ref 65–99)
Potassium: 5.5 mmol/L — ABNORMAL HIGH (ref 3.5–5.1)
Sodium: 136 mmol/L (ref 135–145)
Total Bilirubin: 1 mg/dL (ref 0.3–1.2)
Total Protein: 9.5 g/dL — ABNORMAL HIGH (ref 6.5–8.1)

## 2017-07-06 LAB — CBC WITH DIFFERENTIAL/PLATELET
Basophils Absolute: 0.1 10*3/uL (ref 0.0–0.1)
Basophils Relative: 1 %
Eosinophils Absolute: 0.2 10*3/uL (ref 0.0–0.7)
Eosinophils Relative: 2 %
HCT: 29.1 % — ABNORMAL LOW (ref 39.0–52.0)
Hemoglobin: 10.6 g/dL — ABNORMAL LOW (ref 13.0–17.0)
Lymphocytes Relative: 14 %
Lymphs Abs: 1.7 10*3/uL (ref 0.7–4.0)
MCH: 31.1 pg (ref 26.0–34.0)
MCHC: 36.4 g/dL — ABNORMAL HIGH (ref 30.0–36.0)
MCV: 85.3 fL (ref 78.0–100.0)
Monocytes Absolute: 1.2 10*3/uL — ABNORMAL HIGH (ref 0.1–1.0)
Monocytes Relative: 10 %
Neutro Abs: 8.8 10*3/uL — ABNORMAL HIGH (ref 1.7–7.7)
Neutrophils Relative %: 73 %
Platelets: 301 10*3/uL (ref 150–400)
RBC: 3.41 MIL/uL — ABNORMAL LOW (ref 4.22–5.81)
RDW: 17.4 % — ABNORMAL HIGH (ref 11.5–15.5)
WBC: 12 10*3/uL — ABNORMAL HIGH (ref 4.0–10.5)

## 2017-07-06 LAB — KAPPA/LAMBDA LIGHT CHAINS
Ig Kappa Free Light Chain: 950.7 mg/L — ABNORMAL HIGH (ref 3.3–19.4)
Ig Lambda Free Light Chain: 95.9 mg/L — ABNORMAL HIGH (ref 5.7–26.3)
Kappa/Lambda FluidC Ratio: 9.91 — ABNORMAL HIGH (ref 0.26–1.65)

## 2017-07-06 NOTE — Telephone Encounter (Signed)
Spoke to wife of pt.  He had sz yesterday (55mn).  Wife stating like an absence sx (came out fairly quickly).  EMT called and went to ED.  K+5.5, he is in dialysis MWF.  No missed medications, no illnesses, has been sleeping ok.  Last sz was in 10/2016.  Only new thing is transmitter for BS (attached to stomach).  Made appt for tomorrow at 1030.  [facility]  No sz levels.

## 2017-07-06 NOTE — ED Notes (Addendum)
Pt refused to have an EKG done. Pt was given a Ginger Ale. Pt ambulated in [place] with no problems and no assistance. Stated he was not dizzy nor weak while walking.

## 2017-07-06 NOTE — Telephone Encounter (Signed)
Pt wife(on DPR) is asking for a call.  Pt had a seizure last night, 1st since Feb of this year.  Pt still keeping Nov appointment since it is 1st avail.  She would like to discuss the seizure.

## 2017-07-06 NOTE — ED Notes (Signed)
Per nurse,   Pt refused blood draw.

## 2017-07-06 NOTE — Discharge Instructions (Signed)
Follow-up with your neurologist.  Take your seizure medications as prescribed.  You are due for dialysis tomorrow and declined to have your potassium rechecked tonight.  Return to the ED if you develop new or worsening symptoms.

## 2017-07-06 NOTE — Progress Notes (Addendum)
[facility]  [name]: Isaiah [name] DOB: 23-Jun-1966   REASON FOR VISIT: Follow-up for stroke, right hemiparesis, seizure disorder with seizure 07/05/2017 HISTORY FROM: [name] and wife    HISTORY OF PRESENT ILLNESS:HISTORY PS: Isaiah [name] is a 51 year old African-American gentleman seen today for first office follow-up visit following hospital consultation with Dr. Erlinda Hong in Oct 23, 2015. Isaiah [name] is accompanied by his wife. Isaiah [name] has a complicated PMH of DM type 2 with diabetic neuropathy, Sickle cell-thalassemia,  DVT and PE x 2 in 07/2011 and 1/ 2016 off coumadin due to GIB, OSA, CKD stage V, MGUS and anemia .Isaiah [name] was recently admitted to [facility] in [place] on 09/16/15 with N/V and blood stools with acute on chronic renal failure due to acute pancreatitis. Isaiah [name] was treated with supportive care, IV bicarb as well as multiple units PRBC and EGD with severe stage IV erosive esophagitis with gastritis.HD initiated on 01/09 due to poor recover with decrease in UOP. Isaiah [name] developed hypoxic respiratory failure and was found to have RUL/RML PE as well as left popliteal DVT. Isaiah [name] was treated with IV heparin. IVC filter was placed around 09/21/15 and post procedure was found to have right hand weakness. CT head on 09/26/15 reported possible subacut right MCA/PCA and left cerebellar infarcts which were not correlate to his right hand weakness. Neurology was consulted for input and EMG showed evidence of profound neuropathy. Isaiah [name] had worsening of symptoms with increase in right sided weakness, facial droop speech difficulty on 09/27/15.MRI on 09/29/15 showedscattered right MCA and left MCA, left thalamus and b/l cerebellar infarcts. Isaiah infarcts on CT 09/26/15 likely to be acute too. On 09/30/15, Isaiah [name] had worsening of aphasia and repeat MRI showed extension of left MCA frontal infarcts. MRA head showed left M2 superior branch occlusion.Echo with positive bubble study and  TEE positive for PFO. Hypercoagulation studies reported to be negative. IV heparin was changed to Lovenox due to recurrent embolic strokes on heparin.  Isaiah [name] was then transferred here to [name] rehab on 10/12/15. Isaiah [name] was reported to have small amount of blood in stool on admission. Continued HD for CKD after admission. Due to CKD on HD, his lovenox discontinued and put back on heparin IV. His hematologist Dr. Alen Blew was consulted for anticoagulation use. Isaiah [name] commented "As far as long-term anticoagulation, I feel be reasonable to consider aspirin alone and hope that IVC filter will prevent any future embolic strokes from occurring. An alternative strategy is to consider warfarin and follow closely for signs or symptoms of bleeding." since admission, [name] made significant improvement on his aphasia and right side weakness, and has been able to walk with assistance and speak much more fluent. His H&H remained stable and no obvious bleeding reported. Dr. Erlinda Hong recommended starting warfarin. [name] has tolerated warfarin well without any more episodes of bleeding and hematocrit has remained stable. Isaiah [name] is currently participating in outpatient physical, occupational and speech therapy and making good improvement. His able to states speaks short sentences now with some word hesitancy. Isaiah [name] still has significant weakness in his right hand and grip but is able to walk with a cane. His balance is good is had no major falls. Isaiah [name] does complain of pain and stiffness in his legs and hand particularly at night at times Isaiah [name] has to wake up because of discomfort. Isaiah [name] is being followed in [facility] rehabilitation clinic but currently is not taking any medications for spasticity. His sugars have all been under good control. Update 03/25/2016 PS:  Isaiah [name] returns for follow-up after last visit with me 3 months ago. Isaiah [name] is accompanied today by his son. Isaiah [name] was hospitalized on 4/312017 with a seizure. Isaiah [name] was started on Vimpat 100 twice daily. EEG showed generalized  slowing without definite epileptiform activity. Limited MRI scan Isaiah brain was obtained on 12/3115 which showed multiple late subacute to chronic infarcts and her repeat MRI on 5/117 showed resolution and no definite new acute infarct. [name] was initially felt to be candidate for rehabilitation but after prolonged postictal phase for several days Isaiah [name] made rapid improvement and was discharged home. Doing well. His able to walk without assistance. His 2 has mild expressive aphasia but speech is improving. Still has mild right-sided weakness. Isaiah [name] walks with dragging his right leg. Is tolerating Vimpat well without side effects. Isaiah [name] remains on warfarin and is tolerating it well without bleeding or bruising. Isaiah [name] states his fasting sugars 7 well controlled. Isaiah [name] has no new complaints. UPDATE 10/12/2017[name], 51 year old male returns for follow-up. With medical history of right and left  CVA in January 2017 end-stage renal disease previous pulmonary emboli, seizure disorder ,diabetes, hypertension previously on Coumadin. Isaiah [name] was on vacation in [place] in September and admitted on 05/28/16 after a dialysis treatment Isaiah [name] had a seizure event and was hospitalized Isaiah [name] was also found to be anemic with a hemoglobin of 6.8 indicating positive stools in Isaiah emergency room Isaiah [name] was admitted for complete workup. Isaiah [name] received multiple blood transfusions.  Isaiah [name] received a loading dose of Keppra in Isaiah emergency room and his Vimpat dose was increased to 150 mg twice daily. EEG performed was mildly abnormal due to Isaiah presence of mild intermittent generalized theta slowing which suggest mild encephalopathy of nonspecific etiology. Isaiah [name] was hospital for 12 days and return to [place]. Isaiah [name] denies further seizure events. Isaiah [name] denies further stroke or TIA symptoms Continues  mild expressive aphasia and mild right-sided weakness. Isaiah [name] returns for reevaluation.  UPDATE 11/16/2017[name], 51 year old male returns for follow-up Isaiah [name] has a history  of right and left CVA in January 2017, end-stage renal disease on dialysis, seizure disorder, diabetes hypertension recent GI bleed while vacationing in [place] September 2017 after a dialysis treatment Isaiah [name] had a seizure event and was hospitalized with hemoglobin of 6.8. His aspirin and Coumadin remain on hold. His GI doctor is Dr. Michail Sermon who Isaiah [name] will see again in Isaiah next week or so. Isaiah [name] is currently on Vimpat 150 twice daily. Isaiah [name] had another seizure after dialysis on 07/14/2016 and was taken to Isaiah emergency room where Isaiah [name] was placed on Dilantin 300 mg daily. Wife does not want him to be on Dilantin long term is wanting him to switch to a different medication. Fortunately Isaiah [name] denies further stroke or TIA symptoms. Isaiah [name] remains an speech and occupational therapy and Isaiah [name] continues to have mild right hemiparesis and mild expressive aphasia. Isaiah [name] returns to Isaiah office today for reevaluation  UPDATE 12/18/2017[name], 51 year old male returns for follow-up. Isaiah [name] was seen in Isaiah emergency room on 08/20/16 for a seizure. Isaiah [name] had an abscess of Isaiah right foreheadwhich  was I&Ded at that time. Isaiah [name] remains on antibiotics. Isaiah [name] is currently on Lamictal 50 in Isaiah morning and 100 at night along with Vimpat 150 twice daily. Isaiah [name] is on Dilantin 300 however wife does not want him to be on medication long-term. His aspirin 0.81 has been restarted by  GI doctor,Dr. Michail Sermon. Isaiah [name] has not had further stroke or TIA symptoms. His therapies have concluded. Isaiah [name]  remains with a mild right hemiparesis and mild expressive aphasia. Isaiah [name] returns for reevaluation Update 11/05/2016 PS; Isaiah [name] returns for follow-up after last visit 2 months ago. Isaiah [name] had a breakthrough seizure on 10/06/16 Isaiah [name] was seen in [facility] emergency room. There is no obvious trigger noted. Isaiah [name] was asked to continue his home medications. Isaiah [name] had just tapered Dilantin and stopped it a few days later. Isaiah [name]'s wife is accompanying him appears quite frustrated with [name]  continuing to have seizures. She in fact requested to transfer Isaiah [name]'s care to Dr. Jannifer Franklin for after I had a long discussion with Isaiah [name] and his wife regarding incidence of breakthrough seizures and discussion of a little but treatment options date have decided to stay with me. I offered increasing Isaiah dose of lamotrigine but Isaiah [name] is scared of having dizziness and possible side effects and would like to wait and continue Isaiah current dose for now. Isaiah [name] has opted some improvement in his speech but can talk more fluently now though his speech is quite hesitant. Still has mild right hemiparesis and spasticity and has to walk slowly. Isaiah [name] is had no falls or injuries. Remains on Vimpat 150 twice daily and lamotrigine 150 mg twice daily. Isaiah [name] does complain of orthostasis and presyncopal symptoms during dialysis when Isaiah [name] stands up. Isaiah [name] admits his blood pressure tends to run quite low. Isaiah [name] is taking midodrine every day as well. Isaiah [name] is on aspirin 81 mg and tolerating them well. UPDATE 05/24/2018[name], 51 year old male returns for follow-up with history of seizure disorder and stroke. Isaiah [name] has end-stage renal disease on dialysis. Isaiah [name] also has history of GI bleed. Recent colonoscopy was negative for bleeding. Isaiah [name] is to stay on aspirin for now but Dr. Burt Knack wants to switch him to Plavix. Isaiah [name] also has an appointment to discuss PFO closure. Last seizure activity was January . Isaiah [name] remains on Vimpat 150 twice daily and Lamictal 150 twice daily without side effects. Isaiah [name] continues to have mild right hemiparesis and spasticity. Isaiah [name] denies any recent falls or injuries Isaiah [name] has continued to have improvement in speech although Isaiah [name] can be hesitant at times. Blood pressure in Isaiah office today 124/77 Isaiah [name] returns for reevaluation  UPDATE 10/30/2018[name], 51 year old male returns for follow-up with history of seizure disorder and stroke in February 2017. Isaiah [name] also has end-stage renal disease on dialysis history of  GI bleed. Since last seen Isaiah [name] has seen Dr. Burt Knack who did not want to close PFO. Isaiah [name] remains on aspirin for secondary stroke prevention. Isaiah [name] was seen in Isaiah ER on 06/2817 for seizure event. CMP with potassium 5.5 and CBC with hemoglobin 10.6 hematocrit 29.1 and WBC mildly elevated at 12. Isaiah [name] remains on Vimpat 150 twice daily and Lamictal 150 twice daily. Last seizure prior to this was in January 2018. Isaiah [name] continues to have mild right hemiparesis and spasticity. Isaiah [name] denies any falls. Hesitant speech at times. Blood pressure in Isaiah office today 155/88. Isaiah [name] returns for reevaluation  REVIEW OF SYSTEMS: Full 14 system review of systems performed and notable only for those listed, all others are neg:  Constitutional: neg  Cardiovascular: neg Ear/Nose/Throat: neg  Skin: neg Eyes: neg Respiratory: neg Gastroitestinal: History of GI bleed Hematology/Lymphatic: neg  Endocrine: neg Musculoskeletal:neg Allergy/Immunology: neg Neurological: Seizure disorder, history of stroke  Psychiatric: neg Sleep : neg   ALLERGIES: Allergies  Allergen Reactions  . Codeine Rash and Other (See Comments)    Unknown reaction ([name] says it was  more serious than just a rash, but Isaiah [name] can't remember what happened)    HOME MEDICATIONS: Outpatient Medications Prior to Visit  Medication Sig Dispense Refill  . albuterol (PROAIR HFA) 108 (90 Base) MCG/ACT inhaler Inhale two puffs every 4-6 hours if needed for cough or wheeze 1 Inhaler 1  . aspirin EC 81 MG tablet Take 1 tablet (81 mg total) by mouth daily. 100 tablet 0  . calcium acetate (PHOSLO) 667 MG capsule Take 3 capsules by mouth 3 (three) times daily with meals.     . Cetirizine HCl 10 MG CAPS Take 1 capsule by mouth daily.    . cinacalcet (SENSIPAR) 60 MG tablet Take 60 mg by mouth every other day.     . fluticasone furoate-vilanterol (BREO ELLIPTA) 100-25 MCG/INH AEPB Inhale one puff once daily to prevent cough or wheeze 60 each 5  . insulin glargine (LANTUS) 100  unit/mL SOPN Inject 8 Units into Isaiah skin at bedtime.     . lamoTRIgine (LAMICTAL) 150 MG tablet Take 1 tablet (150 mg total) by mouth 2 (two) times daily. 180 tablet 1  . metoprolol tartrate (LOPRESSOR) 25 MG tablet Take 0.5 tablets (12.5 mg total) by mouth 2 (two) times daily. 60 tablet 0  . midodrine (PROAMATINE) 10 MG tablet Take 10 mg by mouth 2 (two) times daily with a meal.     . multivitamin (RENA-VIT) TABS tablet Take 1 tablet by mouth at bedtime. 30 tablet 1  . pantoprazole (PROTONIX) 40 MG tablet Take 1 tablet (40 mg total) by mouth 2 (two) times daily. ([name] taking differently: Take 40 mg by mouth every morning. ) 60 tablet 1  . VIMPAT 150 MG TABS TAKE 1 TABLET BY MOUTH TWICE DAILY 60 tablet 12   No facility-administered medications prior to visit.     PAST MEDICAL HISTORY: Past Medical History:  Diagnosis Date  . A-fib ([place])   . Acute renal failure ([place]) 07/2011  . Anemia   . Asthma   . Diabetes mellitus without complication ([place])   . ESRD (end stage renal disease) on dialysis ([place])   . Gout   . History of recent blood transfusion 10/27/14   2 Units PRBC's  . Hyperkalemia 07/2011  . Hypertension   . Monoclonal gammopathies   . Monoclonal gammopathy   . OSA on CPAP   . Pulmonary embolism ([place]) 07/2011   a. Tx with Coumadin for 6 months (unknown cause per [name]).  . Pulmonary embolism ([place]) 07/2011; 09/27/2014   a. Bilat PE 07/2011 - unclear cause, tx with 6 months Coumadin.;   . Seizures ([place])   . Sickle cell-thalassemia disease ([place])    a. Sickle cell trait.  . Sleep apnea    a. uses CPAP.  [name] Stroke [facility])     PAST SURGICAL HISTORY: Past Surgical History:  Procedure Laterality Date  . AV FISTULA PLACEMENT Right 12/05/2015   Procedure: INSERTION OF ARTERIOVENOUS (AV) GORE-TEX GRAFT ARM;  Surgeon: Elam Dutch, MD;  Location: [place];  Service: Vascular;  Laterality: Right;  . BONE MARROW BIOPSY    . CHOLECYSTECTOMY    . CHOLECYSTECTOMY  1990's?  .  COLONOSCOPY WITH PROPOFOL N/A 01/22/2017   Procedure: COLONOSCOPY WITH PROPOFOL;  Surgeon: Wilford Corner, MD;  Location: [facility] ENDOSCOPY;  Service: Endoscopy;  Laterality: N/A;  . EYE SURGERY Right   . INSERTION OF DIALYSIS CATHETER Right 10/16/2015   Procedure: INSERTION OF PALINDROME DIALYSIS CATHETER ;  Surgeon: Elam Dutch, MD;  Location: [place];  Service: Vascular;  Laterality: Right;  . OTHER SURGICAL HISTORY     Retinal surgery  . PARS PLANA VITRECTOMY  02/17/2012   Procedure: PARS PLANA VITRECTOMY WITH 25 GAUGE;  Surgeon: Hayden Pedro, MD;  Location: [place];  Service: Ophthalmology;  Laterality: Right;  . PERIPHERAL VASCULAR CATHETERIZATION N/A 03/20/2016   Procedure: A/V Shuntogram/Fistulagram;  Surgeon: Conrad Nelson, MD;  Location: [place] CV LAB;  Service: Cardiovascular;  Laterality: N/A;  . TEE WITHOUT CARDIOVERSION N/A 12/02/2016   Procedure: TRANSESOPHAGEAL ECHOCARDIOGRAM (TEE);  Surgeon: Larey Dresser, MD;  Location: [facility] ENDOSCOPY;  Service: Cardiovascular;  Laterality: N/A;    FAMILY HISTORY: Family History  Problem Relation Age of Onset  . Hypertension Mother   . Diabetes Mother   . Stroke Father   . Sickle cell anemia Brother   . Diabetes type I Brother   . Kidney disease Brother     SOCIAL HISTORY: Social History   Social History  . Marital status: Married    Spouse name: sylvia  . Number of children: 1  . Years of education: college   Occupational History  . [place] History Main Topics  . Smoking status: Never Smoker  . Smokeless tobacco: Never Used  . Alcohol use No  . Drug use: No  . Sexual activity: Yes   Other Topics Concern  . Not on file   Social History Narrative   ** Merged History Encounter **         PHYSICAL EXAM  Vitals:   07/07/17 0945  BP: (!) 155/88  Pulse: 73  Weight: 217 lb 12.8 oz (98.8 kg)  Height: _0  (1.778 m)   Body mass index is 31.25 kg/m.  Generalized: Well developed,  Obese male in no acute distress  Head: normocephalic and atraumatic,. Oropharynx benign  Neck: Supple, no carotid bruits  Cardiac: Regular rate rhythm, no murmur  Musculoskeletal: No deformity Skin fistula right forearm  for dialysis   Neurological examination   Mentation: Alert oriented to time, place, history taking. Attention span and concentration appropriate. Recent and remote memory intact.  Mild word finding difficulties but able to speak in sentences with good comprehension    Cranial nerve II-XII: Pupils were equal round reactive to light extraocular movements were full, visual field were full on confrontational test. Facial sensation and strength were normal. hearing was intact to finger rubbing bilaterally. Uvula tongue midline. head turning and shoulder shrug were normal and symmetric.Tongue protrusion into cheek strength was normal. Motor: Right spastic hemiparesis 4 out of 5 in Isaiah right upper and lower extremity  Sensory: normal and symmetric to light touch, pinprick, and  Vibration, on Isaiah left diminished on Isaiah right Coordination: finger-nose-finger, heel-to-shin bilaterally, impaired on Isaiah right and normal on Isaiah left Reflexes: Symmetric upper and lower, plantar responses were flexor bilaterally. Gait and Station: Rising up from seated position without assistance, mild spastic hemiparetic gait no assistive device no difficulty with turns DIAGNOSTIC DATA (LABS, IMAGING, TESTING) - I reviewed [name] records, labs, notes, testing and imaging myself where available.  Lab Results  Component Value Date   WBC 12.0 (H) 07/05/2017   HGB 10.6 (L) 07/05/2017   HCT 29.1 (L) 07/05/2017   MCV 85.3 07/05/2017   PLT 301 07/05/2017      Component Value Date/Time   NA 136 07/05/2017 2317   NA 138 07/03/2017 0813   K 5.5 (H) 07/05/2017 2317   K 4.2 07/03/2017 0813  CL 99 (L) 07/05/2017 2317   CO2 25 07/05/2017 2317   CO2 27 07/03/2017 0813   GLUCOSE 139 (H) 07/05/2017 2317    GLUCOSE 76 07/03/2017 0813   BUN 51 (H) 07/05/2017 2317   BUN 46.4 (H) 07/03/2017 0813   CREATININE 12.54 (H) 07/05/2017 2317   CREATININE 10.9 (HH) 07/03/2017 0813   CALCIUM 8.5 (L) 07/05/2017 2317   CALCIUM 9.3 07/03/2017 0813   PROT 9.5 (H) 07/05/2017 2317   PROT 10.6 (H) 07/03/2017 0813   ALBUMIN 3.7 07/05/2017 2317   ALBUMIN 4.0 07/03/2017 0813   AST 35 07/05/2017 2317   AST 16 07/03/2017 0813   ALT 22 07/05/2017 2317   ALT 14 07/03/2017 0813   ALKPHOS 64 07/05/2017 2317   ALKPHOS 77 07/03/2017 0813   BILITOT 1.0 07/05/2017 2317   BILITOT 0.93 07/03/2017 0813   GFRNONAA 4 (L) 07/05/2017 2317   GFRAA 5 (L) 07/05/2017 2317   Lab Results  Component Value Date   CHOL 128 10/24/2015   HDL 48 10/24/2015   LDLCALC 62 10/24/2015   TRIG 88 10/24/2015   CHOLHDL 2.7 10/24/2015   Lab Results  Component Value Date   HGBA1C 4.4 (L) 01/06/2016   Lab Results  Component Value Date   [id number] 621 10/24/2015   Lab Results  Component Value Date   TSH 0.896 01/06/2016     ASSESSMENT AND PLAN 51 year old African-American male with bilateral anterior and posterior circulation embolic infarcts in January 2017 secondary to paradoxical embolism from deep vein thrombosis, pulmonary embolism and patent foramen ovale while Isaiah [name] was off Coumadin due to GI bleeding with prolonged hospitalization in [facility] but was transferred here for rehabilitation. Repeat admission in April 2017 for seizure. Isaiah [name] has residual expressive aphasia and spastic right hemiparesis which are improving . Vascular risk factors of diabetes, hypertension, sleep apnea. Repeat hospitalization in September 2017 [place] for seizure events after dialysis and GI bleed. Another seizure on 07/14/2016 after dialysis, seen in Isaiah emergency room and placed on Dilantin.Seen again in [facility] ER on 08/20/16.  with seizure I&D of right forehead  for abscess. Another breakthrough seizure in January 2018. Other brief episodes of  transient unresponsiveness following dialysis likely presyncopal events seizure events since last break through January. Isaiah [name] is a current [name] of Dr. Leonie Man  who is out of Isaiah office today . This note is sent to Isaiah work in doctor.     Continue Vimpat 150 mg twice daily Continue lamotrigine 150 twice daily will refill Continue aspirin at present for stroke prevention Call for seizure activity  Follow-up in 6 months I spent 41mn  in total face to face time with Isaiah [name] more than 50% of which was spent counseling and coordination of care, reviewing test results reviewing medications and discussing and reviewing Isaiah diagnosis of stroke and risk factor management along with seizure disorder and avoiding seizure triggers and review of medical record and further treatment options. , NDennie Bible [facility] APRN  [facility] [address] [place] [phone] I reviewed Isaiah above note and documentation by Isaiah Nurse Practitioner and agree with Isaiah history, physical exam, assessment and plan as outlined above. I was immediately available for face-to-face consultation. SStar Age MD, PhD [facility] ([facility]

## 2017-07-06 NOTE — ED Notes (Signed)
Pt does not make urine so can't get a urine specimen. Pt is refusing blood draw and EKG.

## 2017-07-07 ENCOUNTER — Encounter: Payer: Self-pay | Admitting: Nurse Practitioner

## 2017-07-07 ENCOUNTER — Ambulatory Visit (INDEPENDENT_AMBULATORY_CARE_PROVIDER_SITE_OTHER): Payer: 59 | Admitting: Nurse Practitioner

## 2017-07-07 VITALS — BP 155/88 | HR 73 | Ht 70.0 in | Wt 217.8 lb

## 2017-07-07 DIAGNOSIS — Z5181 Encounter for therapeutic drug level monitoring: Secondary | ICD-10-CM | POA: Diagnosis not present

## 2017-07-07 DIAGNOSIS — Z8673 Personal history of transient ischemic attack (TIA), and cerebral infarction without residual deficits: Secondary | ICD-10-CM | POA: Diagnosis not present

## 2017-07-07 DIAGNOSIS — I69351 Hemiplegia and hemiparesis following cerebral infarction affecting right dominant side: Secondary | ICD-10-CM | POA: Diagnosis not present

## 2017-07-07 DIAGNOSIS — R569 Unspecified convulsions: Secondary | ICD-10-CM | POA: Diagnosis not present

## 2017-07-07 DIAGNOSIS — I10 Essential (primary) hypertension: Secondary | ICD-10-CM

## 2017-07-07 LAB — MULTIPLE MYELOMA PANEL, SERUM
Albumin SerPl Elph-Mcnc: 3.9 g/dL (ref 2.9–4.4)
Albumin/Glob SerPl: 0.7 (ref 0.7–1.7)
Alpha 1: 0.3 g/dL (ref 0.0–0.4)
Alpha2 Glob SerPl Elph-Mcnc: 0.7 g/dL (ref 0.4–1.0)
B-Globulin SerPl Elph-Mcnc: 0.8 g/dL (ref 0.7–1.3)
Gamma Glob SerPl Elph-Mcnc: 3.8 g/dL — ABNORMAL HIGH (ref 0.4–1.8)
Globulin, Total: 5.7 g/dL — ABNORMAL HIGH (ref 2.2–3.9)
IgA, Qn, Serum: 72 mg/dL — ABNORMAL LOW (ref 90–386)
IgG, Qn, Serum: 4862 mg/dL — ABNORMAL HIGH (ref 700–1600)
IgM, Qn, Serum: 26 mg/dL (ref 20–172)
M Protein SerPl Elph-Mcnc: 3.4 g/dL — ABNORMAL HIGH
Total Protein: 9.6 g/dL — ABNORMAL HIGH (ref 6.0–8.5)

## 2017-07-07 NOTE — Patient Instructions (Signed)
Continue Vimpat 150 mg twice daily Continue lamotrigine 150 twice daily will refill Continue aspirin at present for stroke prevention Call for seizure activity  Follow-up in 6 months

## 2017-07-08 DIAGNOSIS — D631 Anemia in chronic kidney disease: Secondary | ICD-10-CM | POA: Diagnosis not present

## 2017-07-08 DIAGNOSIS — Z992 Dependence on renal dialysis: Secondary | ICD-10-CM | POA: Diagnosis not present

## 2017-07-08 DIAGNOSIS — N2581 Secondary hyperparathyroidism of renal origin: Secondary | ICD-10-CM | POA: Diagnosis not present

## 2017-07-08 DIAGNOSIS — E1129 Type 2 diabetes mellitus with other diabetic kidney complication: Secondary | ICD-10-CM | POA: Diagnosis not present

## 2017-07-08 DIAGNOSIS — N186 End stage renal disease: Secondary | ICD-10-CM | POA: Diagnosis not present

## 2017-07-08 DIAGNOSIS — D509 Iron deficiency anemia, unspecified: Secondary | ICD-10-CM | POA: Diagnosis not present

## 2017-07-08 DIAGNOSIS — E1122 Type 2 diabetes mellitus with diabetic chronic kidney disease: Secondary | ICD-10-CM | POA: Diagnosis not present

## 2017-07-08 DIAGNOSIS — D689 Coagulation defect, unspecified: Secondary | ICD-10-CM | POA: Diagnosis not present

## 2017-07-09 ENCOUNTER — Telehealth: Payer: Self-pay

## 2017-07-09 LAB — LACOSAMIDE: Lacosamide: 9.2 ug/mL (ref 5.0–10.0)

## 2017-07-09 LAB — LAMOTRIGINE LEVEL: Lamotrigine Lvl: 8.6 ug/mL (ref 2.0–20.0)

## 2017-07-09 NOTE — Telephone Encounter (Signed)
-----   Message from Dennie Bible, NP sent at 07/09/2017  7:52 AM EDT ----- Good levels of drug please continue same doses

## 2017-07-09 NOTE — Telephone Encounter (Signed)
I spoke with patient's wife, ok per dpr. She is aware of results and voiced understanding and appreciation. She also requested that they be faxed to her at [phone].

## 2017-07-10 DIAGNOSIS — N186 End stage renal disease: Secondary | ICD-10-CM | POA: Diagnosis not present

## 2017-07-10 DIAGNOSIS — D689 Coagulation defect, unspecified: Secondary | ICD-10-CM | POA: Diagnosis not present

## 2017-07-10 DIAGNOSIS — E1129 Type 2 diabetes mellitus with other diabetic kidney complication: Secondary | ICD-10-CM | POA: Diagnosis not present

## 2017-07-10 DIAGNOSIS — N2581 Secondary hyperparathyroidism of renal origin: Secondary | ICD-10-CM | POA: Diagnosis not present

## 2017-07-13 DIAGNOSIS — D689 Coagulation defect, unspecified: Secondary | ICD-10-CM | POA: Diagnosis not present

## 2017-07-13 DIAGNOSIS — N186 End stage renal disease: Secondary | ICD-10-CM | POA: Diagnosis not present

## 2017-07-13 DIAGNOSIS — E1129 Type 2 diabetes mellitus with other diabetic kidney complication: Secondary | ICD-10-CM | POA: Diagnosis not present

## 2017-07-13 DIAGNOSIS — N2581 Secondary hyperparathyroidism of renal origin: Secondary | ICD-10-CM | POA: Diagnosis not present

## 2017-07-13 DIAGNOSIS — D509 Iron deficiency anemia, unspecified: Secondary | ICD-10-CM | POA: Diagnosis not present

## 2017-07-13 MED FILL — VIMPAT 150 MG TABLET: 150 | 30 days supply | Qty: 60 | Fill #1

## 2017-07-13 MED FILL — SENSIPAR 60 MG TABLET: 60 | 30 days supply | Qty: 30 | Fill #2

## 2017-07-14 DIAGNOSIS — E1165 Type 2 diabetes mellitus with hyperglycemia: Secondary | ICD-10-CM | POA: Diagnosis not present

## 2017-07-14 DIAGNOSIS — I6789 Other cerebrovascular disease: Secondary | ICD-10-CM | POA: Diagnosis not present

## 2017-07-14 DIAGNOSIS — G40309 Generalized idiopathic epilepsy and epileptic syndromes, not intractable, without status epilepticus: Secondary | ICD-10-CM | POA: Diagnosis not present

## 2017-07-14 DIAGNOSIS — Z7901 Long term (current) use of anticoagulants: Secondary | ICD-10-CM | POA: Diagnosis not present

## 2017-07-14 MED FILL — LANTUS SOLOSTAR 100 UNITS/M: 100 | 60 days supply | Qty: 15 | Fill #3

## 2017-07-15 DIAGNOSIS — E1129 Type 2 diabetes mellitus with other diabetic kidney complication: Secondary | ICD-10-CM | POA: Diagnosis not present

## 2017-07-15 DIAGNOSIS — N186 End stage renal disease: Secondary | ICD-10-CM | POA: Diagnosis not present

## 2017-07-15 DIAGNOSIS — N2581 Secondary hyperparathyroidism of renal origin: Secondary | ICD-10-CM | POA: Diagnosis not present

## 2017-07-15 DIAGNOSIS — D509 Iron deficiency anemia, unspecified: Secondary | ICD-10-CM | POA: Diagnosis not present

## 2017-07-15 DIAGNOSIS — D689 Coagulation defect, unspecified: Secondary | ICD-10-CM | POA: Diagnosis not present

## 2017-07-17 DIAGNOSIS — N186 End stage renal disease: Secondary | ICD-10-CM | POA: Diagnosis not present

## 2017-07-17 DIAGNOSIS — D509 Iron deficiency anemia, unspecified: Secondary | ICD-10-CM | POA: Diagnosis not present

## 2017-07-17 DIAGNOSIS — N2581 Secondary hyperparathyroidism of renal origin: Secondary | ICD-10-CM | POA: Diagnosis not present

## 2017-07-17 DIAGNOSIS — E1129 Type 2 diabetes mellitus with other diabetic kidney complication: Secondary | ICD-10-CM | POA: Diagnosis not present

## 2017-07-17 DIAGNOSIS — D689 Coagulation defect, unspecified: Secondary | ICD-10-CM | POA: Diagnosis not present

## 2017-07-20 DIAGNOSIS — D689 Coagulation defect, unspecified: Secondary | ICD-10-CM | POA: Diagnosis not present

## 2017-07-20 DIAGNOSIS — N2581 Secondary hyperparathyroidism of renal origin: Secondary | ICD-10-CM | POA: Diagnosis not present

## 2017-07-20 DIAGNOSIS — N186 End stage renal disease: Secondary | ICD-10-CM | POA: Diagnosis not present

## 2017-07-20 DIAGNOSIS — D631 Anemia in chronic kidney disease: Secondary | ICD-10-CM | POA: Diagnosis not present

## 2017-07-20 DIAGNOSIS — E1129 Type 2 diabetes mellitus with other diabetic kidney complication: Secondary | ICD-10-CM | POA: Diagnosis not present

## 2017-07-20 DIAGNOSIS — D509 Iron deficiency anemia, unspecified: Secondary | ICD-10-CM | POA: Diagnosis not present

## 2017-07-20 MED FILL — PANTOPRAZOLE SOD DR 40 MG T: 40 | 90 days supply | Qty: 90 | Fill #2

## 2017-07-20 MED FILL — CONTOUR NEXT STRIPS: 33 days supply | Qty: 100 | Fill #3

## 2017-07-22 DIAGNOSIS — D689 Coagulation defect, unspecified: Secondary | ICD-10-CM | POA: Diagnosis not present

## 2017-07-22 DIAGNOSIS — N186 End stage renal disease: Secondary | ICD-10-CM | POA: Diagnosis not present

## 2017-07-22 DIAGNOSIS — D509 Iron deficiency anemia, unspecified: Secondary | ICD-10-CM | POA: Diagnosis not present

## 2017-07-22 DIAGNOSIS — N2581 Secondary hyperparathyroidism of renal origin: Secondary | ICD-10-CM | POA: Diagnosis not present

## 2017-07-22 DIAGNOSIS — E1129 Type 2 diabetes mellitus with other diabetic kidney complication: Secondary | ICD-10-CM | POA: Diagnosis not present

## 2017-07-22 DIAGNOSIS — D631 Anemia in chronic kidney disease: Secondary | ICD-10-CM | POA: Diagnosis not present

## 2017-07-24 DIAGNOSIS — D689 Coagulation defect, unspecified: Secondary | ICD-10-CM | POA: Diagnosis not present

## 2017-07-24 DIAGNOSIS — N186 End stage renal disease: Secondary | ICD-10-CM | POA: Diagnosis not present

## 2017-07-24 DIAGNOSIS — N2581 Secondary hyperparathyroidism of renal origin: Secondary | ICD-10-CM | POA: Diagnosis not present

## 2017-07-24 DIAGNOSIS — E1129 Type 2 diabetes mellitus with other diabetic kidney complication: Secondary | ICD-10-CM | POA: Diagnosis not present

## 2017-07-24 DIAGNOSIS — D509 Iron deficiency anemia, unspecified: Secondary | ICD-10-CM | POA: Diagnosis not present

## 2017-07-24 DIAGNOSIS — D631 Anemia in chronic kidney disease: Secondary | ICD-10-CM | POA: Diagnosis not present

## 2017-07-26 DIAGNOSIS — D689 Coagulation defect, unspecified: Secondary | ICD-10-CM | POA: Diagnosis not present

## 2017-07-26 DIAGNOSIS — N2581 Secondary hyperparathyroidism of renal origin: Secondary | ICD-10-CM | POA: Diagnosis not present

## 2017-07-26 DIAGNOSIS — N186 End stage renal disease: Secondary | ICD-10-CM | POA: Diagnosis not present

## 2017-07-26 DIAGNOSIS — E1129 Type 2 diabetes mellitus with other diabetic kidney complication: Secondary | ICD-10-CM | POA: Diagnosis not present

## 2017-07-26 DIAGNOSIS — D509 Iron deficiency anemia, unspecified: Secondary | ICD-10-CM | POA: Diagnosis not present

## 2017-07-28 DIAGNOSIS — D509 Iron deficiency anemia, unspecified: Secondary | ICD-10-CM | POA: Diagnosis not present

## 2017-07-28 DIAGNOSIS — N186 End stage renal disease: Secondary | ICD-10-CM | POA: Diagnosis not present

## 2017-07-28 DIAGNOSIS — D689 Coagulation defect, unspecified: Secondary | ICD-10-CM | POA: Diagnosis not present

## 2017-07-28 DIAGNOSIS — E1129 Type 2 diabetes mellitus with other diabetic kidney complication: Secondary | ICD-10-CM | POA: Diagnosis not present

## 2017-07-28 DIAGNOSIS — N2581 Secondary hyperparathyroidism of renal origin: Secondary | ICD-10-CM | POA: Diagnosis not present

## 2017-07-31 DIAGNOSIS — N2581 Secondary hyperparathyroidism of renal origin: Secondary | ICD-10-CM | POA: Diagnosis not present

## 2017-07-31 DIAGNOSIS — N186 End stage renal disease: Secondary | ICD-10-CM | POA: Diagnosis not present

## 2017-07-31 DIAGNOSIS — D509 Iron deficiency anemia, unspecified: Secondary | ICD-10-CM | POA: Diagnosis not present

## 2017-07-31 DIAGNOSIS — E1129 Type 2 diabetes mellitus with other diabetic kidney complication: Secondary | ICD-10-CM | POA: Diagnosis not present

## 2017-07-31 DIAGNOSIS — D689 Coagulation defect, unspecified: Secondary | ICD-10-CM | POA: Diagnosis not present

## 2017-08-03 DIAGNOSIS — D689 Coagulation defect, unspecified: Secondary | ICD-10-CM | POA: Diagnosis not present

## 2017-08-03 DIAGNOSIS — N2581 Secondary hyperparathyroidism of renal origin: Secondary | ICD-10-CM | POA: Diagnosis not present

## 2017-08-03 DIAGNOSIS — E1129 Type 2 diabetes mellitus with other diabetic kidney complication: Secondary | ICD-10-CM | POA: Diagnosis not present

## 2017-08-03 DIAGNOSIS — N186 End stage renal disease: Secondary | ICD-10-CM | POA: Diagnosis not present

## 2017-08-03 DIAGNOSIS — D509 Iron deficiency anemia, unspecified: Secondary | ICD-10-CM | POA: Diagnosis not present

## 2017-08-05 DIAGNOSIS — D509 Iron deficiency anemia, unspecified: Secondary | ICD-10-CM | POA: Diagnosis not present

## 2017-08-05 DIAGNOSIS — E1129 Type 2 diabetes mellitus with other diabetic kidney complication: Secondary | ICD-10-CM | POA: Diagnosis not present

## 2017-08-05 DIAGNOSIS — N186 End stage renal disease: Secondary | ICD-10-CM | POA: Diagnosis not present

## 2017-08-05 DIAGNOSIS — N2581 Secondary hyperparathyroidism of renal origin: Secondary | ICD-10-CM | POA: Diagnosis not present

## 2017-08-05 DIAGNOSIS — D689 Coagulation defect, unspecified: Secondary | ICD-10-CM | POA: Diagnosis not present

## 2017-08-06 ENCOUNTER — Ambulatory Visit: Payer: 59 | Admitting: Nurse Practitioner

## 2017-08-07 DIAGNOSIS — D509 Iron deficiency anemia, unspecified: Secondary | ICD-10-CM | POA: Diagnosis not present

## 2017-08-07 DIAGNOSIS — D689 Coagulation defect, unspecified: Secondary | ICD-10-CM | POA: Diagnosis not present

## 2017-08-07 DIAGNOSIS — N186 End stage renal disease: Secondary | ICD-10-CM | POA: Diagnosis not present

## 2017-08-07 DIAGNOSIS — E1122 Type 2 diabetes mellitus with diabetic chronic kidney disease: Secondary | ICD-10-CM | POA: Diagnosis not present

## 2017-08-07 DIAGNOSIS — N2581 Secondary hyperparathyroidism of renal origin: Secondary | ICD-10-CM | POA: Diagnosis not present

## 2017-08-07 DIAGNOSIS — E1129 Type 2 diabetes mellitus with other diabetic kidney complication: Secondary | ICD-10-CM | POA: Diagnosis not present

## 2017-08-07 DIAGNOSIS — Z992 Dependence on renal dialysis: Secondary | ICD-10-CM | POA: Diagnosis not present

## 2017-08-09 MED FILL — MIDODRINE HCL 10 MG TABLET: 10 | 90 days supply | Qty: 180 | Fill #5

## 2017-08-09 MED FILL — METOPROLOL TARTRATE 25 MG T: 25 | 60 days supply | Qty: 60 | Fill #3

## 2017-08-10 DIAGNOSIS — N186 End stage renal disease: Secondary | ICD-10-CM | POA: Diagnosis not present

## 2017-08-10 DIAGNOSIS — N2581 Secondary hyperparathyroidism of renal origin: Secondary | ICD-10-CM | POA: Diagnosis not present

## 2017-08-10 DIAGNOSIS — D689 Coagulation defect, unspecified: Secondary | ICD-10-CM | POA: Diagnosis not present

## 2017-08-10 DIAGNOSIS — E1129 Type 2 diabetes mellitus with other diabetic kidney complication: Secondary | ICD-10-CM | POA: Diagnosis not present

## 2017-08-10 DIAGNOSIS — D509 Iron deficiency anemia, unspecified: Secondary | ICD-10-CM | POA: Diagnosis not present

## 2017-08-10 MED FILL — VIMPAT 150 MG TABLET: 150 | 30 days supply | Qty: 60 | Fill #2

## 2017-08-12 DIAGNOSIS — E1129 Type 2 diabetes mellitus with other diabetic kidney complication: Secondary | ICD-10-CM | POA: Diagnosis not present

## 2017-08-12 DIAGNOSIS — N186 End stage renal disease: Secondary | ICD-10-CM | POA: Diagnosis not present

## 2017-08-12 DIAGNOSIS — D509 Iron deficiency anemia, unspecified: Secondary | ICD-10-CM | POA: Diagnosis not present

## 2017-08-12 DIAGNOSIS — N2581 Secondary hyperparathyroidism of renal origin: Secondary | ICD-10-CM | POA: Diagnosis not present

## 2017-08-12 DIAGNOSIS — D689 Coagulation defect, unspecified: Secondary | ICD-10-CM | POA: Diagnosis not present

## 2017-08-14 DIAGNOSIS — N2581 Secondary hyperparathyroidism of renal origin: Secondary | ICD-10-CM | POA: Diagnosis not present

## 2017-08-14 DIAGNOSIS — N186 End stage renal disease: Secondary | ICD-10-CM | POA: Diagnosis not present

## 2017-08-14 DIAGNOSIS — D689 Coagulation defect, unspecified: Secondary | ICD-10-CM | POA: Diagnosis not present

## 2017-08-14 DIAGNOSIS — D509 Iron deficiency anemia, unspecified: Secondary | ICD-10-CM | POA: Diagnosis not present

## 2017-08-14 DIAGNOSIS — E1129 Type 2 diabetes mellitus with other diabetic kidney complication: Secondary | ICD-10-CM | POA: Diagnosis not present

## 2017-08-17 DIAGNOSIS — D631 Anemia in chronic kidney disease: Secondary | ICD-10-CM | POA: Diagnosis not present

## 2017-08-17 DIAGNOSIS — N2581 Secondary hyperparathyroidism of renal origin: Secondary | ICD-10-CM | POA: Diagnosis not present

## 2017-08-17 DIAGNOSIS — D689 Coagulation defect, unspecified: Secondary | ICD-10-CM | POA: Diagnosis not present

## 2017-08-17 DIAGNOSIS — N186 End stage renal disease: Secondary | ICD-10-CM | POA: Diagnosis not present

## 2017-08-17 DIAGNOSIS — D509 Iron deficiency anemia, unspecified: Secondary | ICD-10-CM | POA: Diagnosis not present

## 2017-08-17 DIAGNOSIS — E1129 Type 2 diabetes mellitus with other diabetic kidney complication: Secondary | ICD-10-CM | POA: Diagnosis not present

## 2017-08-19 DIAGNOSIS — D509 Iron deficiency anemia, unspecified: Secondary | ICD-10-CM | POA: Diagnosis not present

## 2017-08-19 DIAGNOSIS — D689 Coagulation defect, unspecified: Secondary | ICD-10-CM | POA: Diagnosis not present

## 2017-08-19 DIAGNOSIS — E1129 Type 2 diabetes mellitus with other diabetic kidney complication: Secondary | ICD-10-CM | POA: Diagnosis not present

## 2017-08-19 DIAGNOSIS — D631 Anemia in chronic kidney disease: Secondary | ICD-10-CM | POA: Diagnosis not present

## 2017-08-19 DIAGNOSIS — N2581 Secondary hyperparathyroidism of renal origin: Secondary | ICD-10-CM | POA: Diagnosis not present

## 2017-08-19 DIAGNOSIS — N186 End stage renal disease: Secondary | ICD-10-CM | POA: Diagnosis not present

## 2017-08-21 DIAGNOSIS — D689 Coagulation defect, unspecified: Secondary | ICD-10-CM | POA: Diagnosis not present

## 2017-08-21 DIAGNOSIS — D509 Iron deficiency anemia, unspecified: Secondary | ICD-10-CM | POA: Diagnosis not present

## 2017-08-21 DIAGNOSIS — E1129 Type 2 diabetes mellitus with other diabetic kidney complication: Secondary | ICD-10-CM | POA: Diagnosis not present

## 2017-08-21 DIAGNOSIS — D631 Anemia in chronic kidney disease: Secondary | ICD-10-CM | POA: Diagnosis not present

## 2017-08-21 DIAGNOSIS — N2581 Secondary hyperparathyroidism of renal origin: Secondary | ICD-10-CM | POA: Diagnosis not present

## 2017-08-21 DIAGNOSIS — N186 End stage renal disease: Secondary | ICD-10-CM | POA: Diagnosis not present

## 2017-08-23 MED FILL — BREO ELLIPTA 200-25 MCG INH: 200-25 | 30 days supply | Qty: 60 | Fill #5

## 2017-08-24 DIAGNOSIS — E1129 Type 2 diabetes mellitus with other diabetic kidney complication: Secondary | ICD-10-CM | POA: Diagnosis not present

## 2017-08-24 DIAGNOSIS — D509 Iron deficiency anemia, unspecified: Secondary | ICD-10-CM | POA: Diagnosis not present

## 2017-08-24 DIAGNOSIS — N186 End stage renal disease: Secondary | ICD-10-CM | POA: Diagnosis not present

## 2017-08-24 DIAGNOSIS — D689 Coagulation defect, unspecified: Secondary | ICD-10-CM | POA: Diagnosis not present

## 2017-08-24 DIAGNOSIS — N2581 Secondary hyperparathyroidism of renal origin: Secondary | ICD-10-CM | POA: Diagnosis not present

## 2017-08-24 DIAGNOSIS — Z7682 Awaiting organ transplant status: Secondary | ICD-10-CM | POA: Diagnosis not present

## 2017-08-24 MED FILL — CONTOUR NEXT STRIPS: 33 days supply | Qty: 100 | Fill #0

## 2017-08-25 DIAGNOSIS — E1165 Type 2 diabetes mellitus with hyperglycemia: Secondary | ICD-10-CM | POA: Diagnosis not present

## 2017-08-25 DIAGNOSIS — M2042 Other hammer toe(s) (acquired), left foot: Secondary | ICD-10-CM | POA: Diagnosis not present

## 2017-08-25 DIAGNOSIS — G40309 Generalized idiopathic epilepsy and epileptic syndromes, not intractable, without status epilepticus: Secondary | ICD-10-CM | POA: Diagnosis not present

## 2017-08-25 DIAGNOSIS — I6789 Other cerebrovascular disease: Secondary | ICD-10-CM | POA: Diagnosis not present

## 2017-08-25 DIAGNOSIS — Z7901 Long term (current) use of anticoagulants: Secondary | ICD-10-CM | POA: Diagnosis not present

## 2017-08-25 DIAGNOSIS — M2041 Other hammer toe(s) (acquired), right foot: Secondary | ICD-10-CM | POA: Diagnosis not present

## 2017-08-26 DIAGNOSIS — N186 End stage renal disease: Secondary | ICD-10-CM | POA: Diagnosis not present

## 2017-08-26 DIAGNOSIS — D689 Coagulation defect, unspecified: Secondary | ICD-10-CM | POA: Diagnosis not present

## 2017-08-26 DIAGNOSIS — D509 Iron deficiency anemia, unspecified: Secondary | ICD-10-CM | POA: Diagnosis not present

## 2017-08-26 DIAGNOSIS — N2581 Secondary hyperparathyroidism of renal origin: Secondary | ICD-10-CM | POA: Diagnosis not present

## 2017-08-26 DIAGNOSIS — E1129 Type 2 diabetes mellitus with other diabetic kidney complication: Secondary | ICD-10-CM | POA: Diagnosis not present

## 2017-08-28 DIAGNOSIS — D509 Iron deficiency anemia, unspecified: Secondary | ICD-10-CM | POA: Diagnosis not present

## 2017-08-28 DIAGNOSIS — N186 End stage renal disease: Secondary | ICD-10-CM | POA: Diagnosis not present

## 2017-08-28 DIAGNOSIS — E1129 Type 2 diabetes mellitus with other diabetic kidney complication: Secondary | ICD-10-CM | POA: Diagnosis not present

## 2017-08-28 DIAGNOSIS — N2581 Secondary hyperparathyroidism of renal origin: Secondary | ICD-10-CM | POA: Diagnosis not present

## 2017-08-28 DIAGNOSIS — D689 Coagulation defect, unspecified: Secondary | ICD-10-CM | POA: Diagnosis not present

## 2017-08-30 DIAGNOSIS — D689 Coagulation defect, unspecified: Secondary | ICD-10-CM | POA: Diagnosis not present

## 2017-08-30 DIAGNOSIS — D509 Iron deficiency anemia, unspecified: Secondary | ICD-10-CM | POA: Diagnosis not present

## 2017-08-30 DIAGNOSIS — N2581 Secondary hyperparathyroidism of renal origin: Secondary | ICD-10-CM | POA: Diagnosis not present

## 2017-08-30 DIAGNOSIS — D631 Anemia in chronic kidney disease: Secondary | ICD-10-CM | POA: Diagnosis not present

## 2017-08-30 DIAGNOSIS — E1129 Type 2 diabetes mellitus with other diabetic kidney complication: Secondary | ICD-10-CM | POA: Diagnosis not present

## 2017-08-30 DIAGNOSIS — N186 End stage renal disease: Secondary | ICD-10-CM | POA: Diagnosis not present

## 2017-09-02 DIAGNOSIS — E1129 Type 2 diabetes mellitus with other diabetic kidney complication: Secondary | ICD-10-CM | POA: Diagnosis not present

## 2017-09-02 DIAGNOSIS — N186 End stage renal disease: Secondary | ICD-10-CM | POA: Diagnosis not present

## 2017-09-02 DIAGNOSIS — N2581 Secondary hyperparathyroidism of renal origin: Secondary | ICD-10-CM | POA: Diagnosis not present

## 2017-09-02 DIAGNOSIS — D631 Anemia in chronic kidney disease: Secondary | ICD-10-CM | POA: Diagnosis not present

## 2017-09-02 DIAGNOSIS — D509 Iron deficiency anemia, unspecified: Secondary | ICD-10-CM | POA: Diagnosis not present

## 2017-09-02 DIAGNOSIS — D689 Coagulation defect, unspecified: Secondary | ICD-10-CM | POA: Diagnosis not present

## 2017-09-04 DIAGNOSIS — D509 Iron deficiency anemia, unspecified: Secondary | ICD-10-CM | POA: Diagnosis not present

## 2017-09-04 DIAGNOSIS — D631 Anemia in chronic kidney disease: Secondary | ICD-10-CM | POA: Diagnosis not present

## 2017-09-04 DIAGNOSIS — D689 Coagulation defect, unspecified: Secondary | ICD-10-CM | POA: Diagnosis not present

## 2017-09-04 DIAGNOSIS — N186 End stage renal disease: Secondary | ICD-10-CM | POA: Diagnosis not present

## 2017-09-04 DIAGNOSIS — N2581 Secondary hyperparathyroidism of renal origin: Secondary | ICD-10-CM | POA: Diagnosis not present

## 2017-09-04 DIAGNOSIS — E1129 Type 2 diabetes mellitus with other diabetic kidney complication: Secondary | ICD-10-CM | POA: Diagnosis not present

## 2017-09-06 DIAGNOSIS — N186 End stage renal disease: Secondary | ICD-10-CM | POA: Diagnosis not present

## 2017-09-06 DIAGNOSIS — D689 Coagulation defect, unspecified: Secondary | ICD-10-CM | POA: Diagnosis not present

## 2017-09-06 DIAGNOSIS — N2581 Secondary hyperparathyroidism of renal origin: Secondary | ICD-10-CM | POA: Diagnosis not present

## 2017-09-06 DIAGNOSIS — E1129 Type 2 diabetes mellitus with other diabetic kidney complication: Secondary | ICD-10-CM | POA: Diagnosis not present

## 2017-09-06 MED FILL — UNIFINE PENTIPS 8MM 31G: 31G X 8 MM | 90 days supply | Qty: 100 | Fill #2

## 2017-09-07 ENCOUNTER — Other Ambulatory Visit: Payer: Self-pay | Admitting: Nurse Practitioner

## 2017-09-07 DIAGNOSIS — N186 End stage renal disease: Secondary | ICD-10-CM | POA: Diagnosis not present

## 2017-09-07 DIAGNOSIS — Z992 Dependence on renal dialysis: Secondary | ICD-10-CM | POA: Diagnosis not present

## 2017-09-07 DIAGNOSIS — E1122 Type 2 diabetes mellitus with diabetic chronic kidney disease: Secondary | ICD-10-CM | POA: Diagnosis not present

## 2017-09-07 MED FILL — SENSIPAR 60 MG TABLET: 60 | 30 days supply | Qty: 30 | Fill #3

## 2017-09-07 MED FILL — VIMPAT 150 MG TABLET: 150 | 30 days supply | Qty: 60 | Fill #3

## 2017-09-09 DIAGNOSIS — N2581 Secondary hyperparathyroidism of renal origin: Secondary | ICD-10-CM | POA: Diagnosis not present

## 2017-09-09 DIAGNOSIS — D509 Iron deficiency anemia, unspecified: Secondary | ICD-10-CM | POA: Diagnosis not present

## 2017-09-09 DIAGNOSIS — D689 Coagulation defect, unspecified: Secondary | ICD-10-CM | POA: Diagnosis not present

## 2017-09-09 DIAGNOSIS — E1129 Type 2 diabetes mellitus with other diabetic kidney complication: Secondary | ICD-10-CM | POA: Diagnosis not present

## 2017-09-09 DIAGNOSIS — N186 End stage renal disease: Secondary | ICD-10-CM | POA: Diagnosis not present

## 2017-09-09 MED FILL — lamoTRIgine 150 MG TABS: 150 | 90 days supply | Qty: 180 | Fill #0

## 2017-09-11 DIAGNOSIS — E1129 Type 2 diabetes mellitus with other diabetic kidney complication: Secondary | ICD-10-CM | POA: Diagnosis not present

## 2017-09-11 DIAGNOSIS — N186 End stage renal disease: Secondary | ICD-10-CM | POA: Diagnosis not present

## 2017-09-11 DIAGNOSIS — D689 Coagulation defect, unspecified: Secondary | ICD-10-CM | POA: Diagnosis not present

## 2017-09-11 DIAGNOSIS — D509 Iron deficiency anemia, unspecified: Secondary | ICD-10-CM | POA: Diagnosis not present

## 2017-09-11 DIAGNOSIS — N2581 Secondary hyperparathyroidism of renal origin: Secondary | ICD-10-CM | POA: Diagnosis not present

## 2017-09-14 DIAGNOSIS — E1129 Type 2 diabetes mellitus with other diabetic kidney complication: Secondary | ICD-10-CM | POA: Diagnosis not present

## 2017-09-14 DIAGNOSIS — N186 End stage renal disease: Secondary | ICD-10-CM | POA: Diagnosis not present

## 2017-09-14 DIAGNOSIS — D631 Anemia in chronic kidney disease: Secondary | ICD-10-CM | POA: Diagnosis not present

## 2017-09-14 DIAGNOSIS — D509 Iron deficiency anemia, unspecified: Secondary | ICD-10-CM | POA: Diagnosis not present

## 2017-09-14 DIAGNOSIS — D689 Coagulation defect, unspecified: Secondary | ICD-10-CM | POA: Diagnosis not present

## 2017-09-14 DIAGNOSIS — N2581 Secondary hyperparathyroidism of renal origin: Secondary | ICD-10-CM | POA: Diagnosis not present

## 2017-09-16 DIAGNOSIS — D509 Iron deficiency anemia, unspecified: Secondary | ICD-10-CM | POA: Diagnosis not present

## 2017-09-16 DIAGNOSIS — D631 Anemia in chronic kidney disease: Secondary | ICD-10-CM | POA: Diagnosis not present

## 2017-09-16 DIAGNOSIS — N186 End stage renal disease: Secondary | ICD-10-CM | POA: Diagnosis not present

## 2017-09-16 DIAGNOSIS — E1129 Type 2 diabetes mellitus with other diabetic kidney complication: Secondary | ICD-10-CM | POA: Diagnosis not present

## 2017-09-16 DIAGNOSIS — D689 Coagulation defect, unspecified: Secondary | ICD-10-CM | POA: Diagnosis not present

## 2017-09-16 DIAGNOSIS — N2581 Secondary hyperparathyroidism of renal origin: Secondary | ICD-10-CM | POA: Diagnosis not present

## 2017-09-18 DIAGNOSIS — N186 End stage renal disease: Secondary | ICD-10-CM | POA: Diagnosis not present

## 2017-09-18 DIAGNOSIS — N2581 Secondary hyperparathyroidism of renal origin: Secondary | ICD-10-CM | POA: Diagnosis not present

## 2017-09-18 DIAGNOSIS — D509 Iron deficiency anemia, unspecified: Secondary | ICD-10-CM | POA: Diagnosis not present

## 2017-09-18 DIAGNOSIS — D631 Anemia in chronic kidney disease: Secondary | ICD-10-CM | POA: Diagnosis not present

## 2017-09-18 DIAGNOSIS — E1129 Type 2 diabetes mellitus with other diabetic kidney complication: Secondary | ICD-10-CM | POA: Diagnosis not present

## 2017-09-18 DIAGNOSIS — D689 Coagulation defect, unspecified: Secondary | ICD-10-CM | POA: Diagnosis not present

## 2017-09-21 DIAGNOSIS — E1129 Type 2 diabetes mellitus with other diabetic kidney complication: Secondary | ICD-10-CM | POA: Diagnosis not present

## 2017-09-21 DIAGNOSIS — D509 Iron deficiency anemia, unspecified: Secondary | ICD-10-CM | POA: Diagnosis not present

## 2017-09-21 DIAGNOSIS — D689 Coagulation defect, unspecified: Secondary | ICD-10-CM | POA: Diagnosis not present

## 2017-09-21 DIAGNOSIS — N186 End stage renal disease: Secondary | ICD-10-CM | POA: Diagnosis not present

## 2017-09-21 DIAGNOSIS — N2581 Secondary hyperparathyroidism of renal origin: Secondary | ICD-10-CM | POA: Diagnosis not present

## 2017-09-23 DIAGNOSIS — D689 Coagulation defect, unspecified: Secondary | ICD-10-CM | POA: Diagnosis not present

## 2017-09-23 DIAGNOSIS — N186 End stage renal disease: Secondary | ICD-10-CM | POA: Diagnosis not present

## 2017-09-23 DIAGNOSIS — E1129 Type 2 diabetes mellitus with other diabetic kidney complication: Secondary | ICD-10-CM | POA: Diagnosis not present

## 2017-09-23 DIAGNOSIS — D509 Iron deficiency anemia, unspecified: Secondary | ICD-10-CM | POA: Diagnosis not present

## 2017-09-23 DIAGNOSIS — N2581 Secondary hyperparathyroidism of renal origin: Secondary | ICD-10-CM | POA: Diagnosis not present

## 2017-09-25 DIAGNOSIS — N186 End stage renal disease: Secondary | ICD-10-CM | POA: Diagnosis not present

## 2017-09-25 DIAGNOSIS — E1129 Type 2 diabetes mellitus with other diabetic kidney complication: Secondary | ICD-10-CM | POA: Diagnosis not present

## 2017-09-25 DIAGNOSIS — N2581 Secondary hyperparathyroidism of renal origin: Secondary | ICD-10-CM | POA: Diagnosis not present

## 2017-09-25 DIAGNOSIS — D509 Iron deficiency anemia, unspecified: Secondary | ICD-10-CM | POA: Diagnosis not present

## 2017-09-25 DIAGNOSIS — D689 Coagulation defect, unspecified: Secondary | ICD-10-CM | POA: Diagnosis not present

## 2017-09-28 ENCOUNTER — Emergency Department (HOSPITAL_COMMUNITY)
Admission: EM | Admit: 2017-09-28 | Discharge: 2017-09-28 | Disposition: A | Discharge status: Home / Self Care | Payer: 59 | Attending: Emergency Medicine | Admitting: Emergency Medicine

## 2017-09-28 ENCOUNTER — Encounter (HOSPITAL_COMMUNITY): Payer: Self-pay

## 2017-09-28 DIAGNOSIS — Z8673 Personal history of transient ischemic attack (TIA), and cerebral infarction without residual deficits: Secondary | ICD-10-CM | POA: Diagnosis not present

## 2017-09-28 DIAGNOSIS — R569 Unspecified convulsions: Secondary | ICD-10-CM | POA: Insufficient documentation

## 2017-09-28 DIAGNOSIS — Z992 Dependence on renal dialysis: Secondary | ICD-10-CM | POA: Insufficient documentation

## 2017-09-28 DIAGNOSIS — Z7982 Long term (current) use of aspirin: Secondary | ICD-10-CM | POA: Insufficient documentation

## 2017-09-28 DIAGNOSIS — N186 End stage renal disease: Secondary | ICD-10-CM | POA: Diagnosis not present

## 2017-09-28 DIAGNOSIS — N2581 Secondary hyperparathyroidism of renal origin: Secondary | ICD-10-CM | POA: Diagnosis not present

## 2017-09-28 DIAGNOSIS — J45909 Unspecified asthma, uncomplicated: Secondary | ICD-10-CM | POA: Insufficient documentation

## 2017-09-28 DIAGNOSIS — Z79899 Other long term (current) drug therapy: Secondary | ICD-10-CM | POA: Insufficient documentation

## 2017-09-28 DIAGNOSIS — E1122 Type 2 diabetes mellitus with diabetic chronic kidney disease: Secondary | ICD-10-CM | POA: Diagnosis not present

## 2017-09-28 DIAGNOSIS — I5042 Chronic combined systolic (congestive) and diastolic (congestive) heart failure: Secondary | ICD-10-CM | POA: Insufficient documentation

## 2017-09-28 DIAGNOSIS — E1140 Type 2 diabetes mellitus with diabetic neuropathy, unspecified: Secondary | ICD-10-CM | POA: Diagnosis not present

## 2017-09-28 DIAGNOSIS — D631 Anemia in chronic kidney disease: Secondary | ICD-10-CM | POA: Diagnosis not present

## 2017-09-28 DIAGNOSIS — D509 Iron deficiency anemia, unspecified: Secondary | ICD-10-CM | POA: Diagnosis not present

## 2017-09-28 DIAGNOSIS — I132 Hypertensive heart and chronic kidney disease with heart failure and with stage 5 chronic kidney disease, or end stage renal disease: Secondary | ICD-10-CM | POA: Diagnosis not present

## 2017-09-28 DIAGNOSIS — D689 Coagulation defect, unspecified: Secondary | ICD-10-CM | POA: Diagnosis not present

## 2017-09-28 DIAGNOSIS — E1129 Type 2 diabetes mellitus with other diabetic kidney complication: Secondary | ICD-10-CM | POA: Diagnosis not present

## 2017-09-28 DIAGNOSIS — Z794 Long term (current) use of insulin: Secondary | ICD-10-CM | POA: Diagnosis not present

## 2017-09-28 DIAGNOSIS — G40909 Epilepsy, unspecified, not intractable, without status epilepticus: Secondary | ICD-10-CM | POA: Diagnosis not present

## 2017-09-28 LAB — I-STAT CHEM 8, ED
BUN: 21 mg/dL — ABNORMAL HIGH (ref 6–20)
Calcium, Ion: 0.95 mmol/L — ABNORMAL LOW (ref 1.15–1.40)
Chloride: 96 mmol/L — ABNORMAL LOW (ref 101–111)
Creatinine, Ser: 7.6 mg/dL — ABNORMAL HIGH (ref 0.61–1.24)
Glucose, Bld: 122 mg/dL — ABNORMAL HIGH (ref 65–99)
HCT: 40 % (ref 39.0–52.0)
Hemoglobin: 13.6 g/dL (ref 13.0–17.0)
Potassium: 3.7 mmol/L (ref 3.5–5.1)
Sodium: 139 mmol/L (ref 135–145)
TCO2: 27 mmol/L (ref 22–32)

## 2017-09-28 LAB — CBG MONITORING, ED: Glucose-Capillary: 127 mg/dL — ABNORMAL HIGH (ref 65–99)

## 2017-09-28 NOTE — ED Notes (Signed)
PT states understanding of care given, follow up care. PT  ambulated from ED to car with a steady gait.

## 2017-09-28 NOTE — ED Provider Notes (Signed)
[facility] EMERGENCY DEPARTMENT Provider Note   CSN: [id number] Arrival date & time: 09/28/17  1705     History   Chief Complaint Chief Complaint  Patient presents with  . Seizures    HPI Isaiah [name] is a 53 y.o. male.  The patient has a history of seizures.  He has been taking his seizure medicine.  The patient had a seizure today and his last seizure was approximately 4 months ago.  He states that last night he did not get much sleep at all and that is somewhat unusual for him   The history is provided by the patient. No language interpreter was used.  Seizures   This is a recurrent problem. The current episode started 1 to 2 hours ago. The problem has been resolved. There was 1 seizure. Pertinent negatives include no sleepiness, no headaches, no chest pain, no cough and no diarrhea. Characteristics do not include eye blinking. The episode was witnessed. There was no sensation of an aura present. The seizures did not continue in the ED. There has been no fever.    Past Medical History:  Diagnosis Date  . A-fib ([place])   . Acute renal failure ([place]) 07/2011  . Anemia   . Asthma   . Diabetes mellitus without complication ([place])   . ESRD (end stage renal disease) on dialysis ([place])   . Gout   . History of recent blood transfusion 10/27/14   2 Units PRBC's  . Hyperkalemia 07/2011  . Hypertension   . Monoclonal gammopathies   . Monoclonal gammopathy   . OSA on CPAP   . Pulmonary embolism ([place]) 07/2011   a. Tx with Coumadin for 6 months (unknown cause per patient).  . Pulmonary embolism ([place]) 07/2011; 09/27/2014   a. Bilat PE 07/2011 - unclear cause, tx with 6 months Coumadin.;   . Seizures ([place])   . Sickle cell-thalassemia disease ([place])    a. Sickle cell trait.  . Sleep apnea    a. uses CPAP.  [name] St[facility])     Patient Active Problem List   Diagnosis Date Noted  . Iron deficiency anemia, unspecified 01/22/2017  . Hemiparesis affecting right side  as late effect of cerebrovascular accident ([place]) 02/28/2016  . Aphasia complicating stroke 76/72/0947  . Acute encephalopathy 01/08/2016  . Seizures ([place]) 01/08/2016  . History of ischemic left MCA stroke 01/08/2016  . Right sided weakness 01/08/2016  . Chronic combined systolic and diastolic CHF (congestive heart failure) ([place]) 01/08/2016  . Atrial fibrillation ([place]) 01/08/2016  . Leukocytosis 01/08/2016  . Benign essential HTN 01/08/2016  . Anemia of chronic disease 01/08/2016  . Controlled type 2 diabetes mellitus with diabetic nephropathy, without long-term current use of insulin ([place]) 01/08/2016  . History of DVT (deep vein thrombosis) 01/08/2016  . ESRD (end stage renal disease) on dialysis [name])     Past Surgical History:  Procedure Laterality Date  . AV FISTULA PLACEMENT Right 12/05/2015   Procedure: INSERTION OF ARTERIOVENOUS (AV) GORE-TEX GRAFT ARM;  Surgeon: Elam Dutch, MD;  Location: [facility];  Service: Vascular;  Laterality: Right;  . BONE MARROW BIOPSY    . CHOLECYSTECTOMY    . CHOLECYSTECTOMY  1990's?  . COLONOSCOPY WITH PROPOFOL N/A 01/22/2017   Procedure: COLONOSCOPY WITH PROPOFOL;  Surgeon: Wilford Corner, MD;  Location: [facility];  Service: Endoscopy;  Laterality: N/A;  . EYE SURGERY Right   . INSERTION OF DIALYSIS CATHETER Right 10/16/2015   Procedure: INSERTION OF PALINDROME DIALYSIS  CATHETER ;  Surgeon: Elam Dutch, MD;  Location: [place];  Service: Vascular;  Laterality: Right;  . OTHER SURGICAL HISTORY     Retinal surgery  . PARS PLANA VITRECTOMY  02/17/2012   Procedure: PARS PLANA VITRECTOMY WITH 25 GAUGE;  Surgeon: Hayden Pedro, MD;  Location: [place];  Service: Ophthalmology;  Laterality: Right;  . PERIPHERAL VASCULAR CATHETERIZATION N/A 03/20/2016   Procedure: A/V Shuntogram/Fistulagram;  Surgeon: Conrad San Rafael, MD;  Location: [facility] CV LAB;  Service: Cardiovascular;  Laterality: N/A;  . TEE WITHOUT CARDIOVERSION N/A 12/02/2016   Procedure:  TRANSESOPHAGEAL ECHOCARDIOGRAM (TEE);  Surgeon: Larey Dresser, MD;  Location: [facility];  Service: Cardiovascular;  Laterality: N/A;       Home Medications    Prior to Admission medications   Medication Sig Start Date End Date Taking? Authorizing Provider  albuterol (PROAIR HFA) 108 (90 Base) MCG/ACT inhaler Inhale two puffs every 4-6 hours if needed for cough or wheeze 04/08/16  Yes Kozlow, Donnamarie Poag, MD  aspirin EC 81 MG tablet Take 1 tablet (81 mg total) by mouth daily. 11/02/15  Yes Love, Ivan Anchors, PA-C  calcium acetate (PHOSLO) 667 MG capsule Take 2,001 mg by mouth 3 (three) times daily with meals.  11/24/16  Yes [provider]  Cetirizine HCl 10 MG CAPS Take 1 capsule by mouth daily.   Yes [provider]  cinacalcet (SENSIPAR) 60 MG tablet Take 60 mg by mouth every other day.  12/27/15  Yes [provider]  fluticasone furoate-vilanterol (BREO ELLIPTA) 100-25 MCG/INH AEPB Inhale one puff once daily to prevent cough or wheeze 04/08/16  Yes Kozlow, Donnamarie Poag, MD  insulin glargine (LANTUS) 100 unit/mL SOPN Inject 8 Units into the skin at bedtime.    Yes [provider]  lamoTRIgine (LAMICTAL) 150 MG tablet TAKE 1 TABLET BY MOUTH TWICE A DAY Patient taking differently: TAKE 150 mg TABLET BY MOUTH TWICE A DAY 09/09/17  Yes Dennie Bible, NP  metoprolol tartrate (LOPRESSOR) 25 MG tablet Take 0.5 tablets (12.5 mg total) by mouth 2 (two) times daily. 01/10/16  Yes Short, Noah Delaine, MD  midodrine (PROAMATINE) 10 MG tablet Take 10 mg by mouth 2 (two) times daily with a meal.    Yes [provider]  multivitamin (RENA-VIT) TABS tablet Take 1 tablet by mouth at bedtime. 11/02/15  Yes Love, Ivan Anchors, PA-C  pantoprazole (PROTONIX) 40 MG tablet Take 1 tablet (40 mg total) by mouth 2 (two) times daily. Patient taking differently: Take 40 mg by mouth every morning.  11/02/15  Yes Love, Pamela S, PA-C  VIMPAT 150 MG TABS TAKE 1 TABLET BY MOUTH TWICE DAILY Patient  taking differently: TAKE 150 mgTABLET BY MOUTH TWICE DAILY 06/15/17  Yes Garvin Fila, MD    Family History Family History  Problem Relation Age of Onset  . Hypertension Mother   . Diabetes Mother   . Stroke Father   . Sickle cell anemia Brother   . Diabetes type I Brother   . Kidney disease Brother     Social History Social History   Tobacco Use  . Smoking status: Never Smoker  . Smokeless tobacco: Never Used  Substance Use Topics  . Alcohol use: No    Alcohol/week: 0.0 oz  . Drug use: No     Allergies   Codeine   Review of Systems Review of Systems  Constitutional: Negative for appetite change and fatigue.  HENT: Negative for congestion, ear discharge and sinus  pressure.   Eyes: Negative for discharge.  Respiratory: Negative for cough.   Cardiovascular: Negative for chest pain.  Gastrointestinal: Negative for abdominal pain and diarrhea.  Genitourinary: Negative for frequency and hematuria.  Musculoskeletal: Negative for back pain.  Skin: Negative for rash.  Neurological: Positive for seizures. Negative for headaches.  Psychiatric/Behavioral: Negative for hallucinations.     Physical Exam Updated Vital Signs BP 125/74   Pulse (!) 101   Resp (!) 21   Ht _0  (1.778 m)   Wt 99.8 kg (220 lb)   SpO2 94%   BMI 31.57 kg/m   Physical Exam  Constitutional: He is oriented to person, place, and time. He appears well-developed.  HENT:  Head: Normocephalic.  Eyes: Conjunctivae and EOM are normal. No scleral icterus.  Neck: Neck supple. No thyromegaly present.  Cardiovascular: Normal rate and regular rhythm. Exam reveals no gallop and no friction rub.  No murmur heard. Pulmonary/Chest: No stridor. He has no wheezes. He has no rales. He exhibits no tenderness.  Abdominal: He exhibits no distension. There is no tenderness. There is no rebound.  Musculoskeletal: Normal range of motion. He exhibits no edema.  Lymphadenopathy:    He has no cervical  adenopathy.  Neurological: He is oriented to person, place, and time. He exhibits normal muscle tone. Coordination normal.  Skin: No rash noted. No erythema.  Psychiatric: He has a normal mood and affect. His behavior is normal.     ED Treatments / Results  Labs (all labs ordered are listed, but only abnormal results are displayed) Labs Reviewed  CBG MONITORING, ED - Abnormal; Notable for the following components:      Result Value   Glucose-Capillary 127 (*)    All other components within normal limits  I-STAT CHEM 8, ED - Abnormal; Notable for the following components:   Chloride 96 (*)    BUN 21 (*)    Creatinine, Ser 7.60 (*)    Glucose, Bld 122 (*)    Calcium, Ion 0.95 (*)    All other components within normal limits    EKG  EKG Interpretation None       Radiology No results found.  Procedures Procedures (including critical care time)  Medications Ordered in ED Medications - No data to display   Initial Impression / Assessment and Plan / ED Course  I have reviewed the triage vital signs and the nursing notes.  Pertinent labs & imaging results that were available during my care of the patient were reviewed by me and considered in my medical decision making (see chart for details).     Patient has a history of seizures.  He had one seizure today.  Patient states that he did go to dialysis today and this occurred after his dialysis.  He has been taking his seizure medicines appropriately.  His last seizure was 4 months ago.  He had minimal sleep last night.  Labs show the kidney failure but otherwise unremarkable.  Patient was monitored for 2 hours in the emergency department without any seizures.  Patient will be discharged home and he will follow-up on the phone tomorrow with his neurologist and let them know that he had a seizure today  Final Clinical Impressions(s) / ED Diagnoses   Final diagnoses:  Seizure [facility])    ED Discharge Orders    None         Milton Ferguson, MD 09/28/17 1858

## 2017-09-28 NOTE — ED Notes (Signed)
Grandma seizure was last in 2016

## 2017-09-28 NOTE — Discharge Instructions (Signed)
Get at least 7 hours of sleep at night.  Call your neurologist tomorrow and let them know that you had a seizure.

## 2017-09-28 NOTE — ED Triage Notes (Addendum)
Pt BIB [facility] from dialysis. Pt completed treatment when pt started to have a grand mal seizure that lasted 20-30 seconds. Pt has right sided facial droop and slurred speech that is known with his postical phase that lasted 30 minutes. Pt has hx to seizures last one was in October 2018. Pt also has had one grand mal seizure in the past.

## 2017-09-29 ENCOUNTER — Telehealth: Payer: Self-pay | Admitting: Nurse Practitioner

## 2017-09-29 ENCOUNTER — Telehealth: Payer: Self-pay | Admitting: Neurology

## 2017-09-29 NOTE — Telephone Encounter (Signed)
He is a dialysis patient and can only be seen on Tuesdays or Thursdays.  I do not have anything but Dr. Leonie Man should have work in  slots

## 2017-09-29 NOTE — Telephone Encounter (Deleted)
Called pt to schedule E.D. seizure follow-up. Left VM for pt to CB to schedule using RN work in slot at 1:30PM.

## 2017-09-29 NOTE — Telephone Encounter (Signed)
Pt's wife called he had a seizure yesterday just getting off the dialysis machine. She said this was not his normal mini seizure it was a grand mall. He was taken to [facility], no meds or dosing was changed. She is wanting to schedule appt for a Tuesday or Thursday preferably the beginning of February. Please call to get him on the schedule as there are not any openings and if the pt needs to be seen asap.

## 2017-09-29 NOTE — Telephone Encounter (Signed)
Error

## 2017-09-29 NOTE — Telephone Encounter (Signed)
Note    Called pt to schedule E.D. seizure follow-up. Left VM for pt to CB to schedule an appointment.

## 2017-09-30 DIAGNOSIS — D689 Coagulation defect, unspecified: Secondary | ICD-10-CM | POA: Diagnosis not present

## 2017-09-30 DIAGNOSIS — N186 End stage renal disease: Secondary | ICD-10-CM | POA: Diagnosis not present

## 2017-09-30 DIAGNOSIS — D509 Iron deficiency anemia, unspecified: Secondary | ICD-10-CM | POA: Diagnosis not present

## 2017-09-30 DIAGNOSIS — N2581 Secondary hyperparathyroidism of renal origin: Secondary | ICD-10-CM | POA: Diagnosis not present

## 2017-09-30 DIAGNOSIS — E1129 Type 2 diabetes mellitus with other diabetic kidney complication: Secondary | ICD-10-CM | POA: Diagnosis not present

## 2017-09-30 DIAGNOSIS — D631 Anemia in chronic kidney disease: Secondary | ICD-10-CM | POA: Diagnosis not present

## 2017-09-30 NOTE — Telephone Encounter (Signed)
PT schedule per [facility] for ED follow up seizures.

## 2017-10-01 DIAGNOSIS — I871 Compression of vein: Secondary | ICD-10-CM | POA: Diagnosis not present

## 2017-10-01 DIAGNOSIS — N186 End stage renal disease: Secondary | ICD-10-CM | POA: Diagnosis not present

## 2017-10-01 DIAGNOSIS — Z992 Dependence on renal dialysis: Secondary | ICD-10-CM | POA: Diagnosis not present

## 2017-10-02 DIAGNOSIS — D509 Iron deficiency anemia, unspecified: Secondary | ICD-10-CM | POA: Diagnosis not present

## 2017-10-02 DIAGNOSIS — N186 End stage renal disease: Secondary | ICD-10-CM | POA: Diagnosis not present

## 2017-10-02 DIAGNOSIS — E1129 Type 2 diabetes mellitus with other diabetic kidney complication: Secondary | ICD-10-CM | POA: Diagnosis not present

## 2017-10-02 DIAGNOSIS — D689 Coagulation defect, unspecified: Secondary | ICD-10-CM | POA: Diagnosis not present

## 2017-10-02 DIAGNOSIS — N2581 Secondary hyperparathyroidism of renal origin: Secondary | ICD-10-CM | POA: Diagnosis not present

## 2017-10-02 DIAGNOSIS — D631 Anemia in chronic kidney disease: Secondary | ICD-10-CM | POA: Diagnosis not present

## 2017-10-04 MED FILL — BREO ELLIPTA 200-25 MCG INH: 200-25 | 30 days supply | Qty: 60 | Fill #6

## 2017-10-05 DIAGNOSIS — N186 End stage renal disease: Secondary | ICD-10-CM | POA: Diagnosis not present

## 2017-10-05 DIAGNOSIS — D509 Iron deficiency anemia, unspecified: Secondary | ICD-10-CM | POA: Diagnosis not present

## 2017-10-05 DIAGNOSIS — N2581 Secondary hyperparathyroidism of renal origin: Secondary | ICD-10-CM | POA: Diagnosis not present

## 2017-10-05 DIAGNOSIS — D689 Coagulation defect, unspecified: Secondary | ICD-10-CM | POA: Diagnosis not present

## 2017-10-05 DIAGNOSIS — E1129 Type 2 diabetes mellitus with other diabetic kidney complication: Secondary | ICD-10-CM | POA: Diagnosis not present

## 2017-10-07 DIAGNOSIS — N186 End stage renal disease: Secondary | ICD-10-CM | POA: Diagnosis not present

## 2017-10-07 DIAGNOSIS — E1129 Type 2 diabetes mellitus with other diabetic kidney complication: Secondary | ICD-10-CM | POA: Diagnosis not present

## 2017-10-07 DIAGNOSIS — D689 Coagulation defect, unspecified: Secondary | ICD-10-CM | POA: Diagnosis not present

## 2017-10-07 DIAGNOSIS — N2581 Secondary hyperparathyroidism of renal origin: Secondary | ICD-10-CM | POA: Diagnosis not present

## 2017-10-07 DIAGNOSIS — D509 Iron deficiency anemia, unspecified: Secondary | ICD-10-CM | POA: Diagnosis not present

## 2017-10-08 DIAGNOSIS — Z992 Dependence on renal dialysis: Secondary | ICD-10-CM | POA: Diagnosis not present

## 2017-10-08 DIAGNOSIS — N186 End stage renal disease: Secondary | ICD-10-CM | POA: Diagnosis not present

## 2017-10-08 DIAGNOSIS — E1122 Type 2 diabetes mellitus with diabetic chronic kidney disease: Secondary | ICD-10-CM | POA: Diagnosis not present

## 2017-10-09 DIAGNOSIS — N2581 Secondary hyperparathyroidism of renal origin: Secondary | ICD-10-CM | POA: Diagnosis not present

## 2017-10-09 DIAGNOSIS — D689 Coagulation defect, unspecified: Secondary | ICD-10-CM | POA: Diagnosis not present

## 2017-10-09 DIAGNOSIS — N186 End stage renal disease: Secondary | ICD-10-CM | POA: Diagnosis not present

## 2017-10-09 DIAGNOSIS — E1122 Type 2 diabetes mellitus with diabetic chronic kidney disease: Secondary | ICD-10-CM | POA: Diagnosis not present

## 2017-10-09 DIAGNOSIS — Z992 Dependence on renal dialysis: Secondary | ICD-10-CM | POA: Diagnosis not present

## 2017-10-09 DIAGNOSIS — D509 Iron deficiency anemia, unspecified: Secondary | ICD-10-CM | POA: Diagnosis not present

## 2017-10-09 DIAGNOSIS — D631 Anemia in chronic kidney disease: Secondary | ICD-10-CM | POA: Diagnosis not present

## 2017-10-09 DIAGNOSIS — E1129 Type 2 diabetes mellitus with other diabetic kidney complication: Secondary | ICD-10-CM | POA: Diagnosis not present

## 2017-10-12 DIAGNOSIS — D509 Iron deficiency anemia, unspecified: Secondary | ICD-10-CM | POA: Diagnosis not present

## 2017-10-12 DIAGNOSIS — N186 End stage renal disease: Secondary | ICD-10-CM | POA: Diagnosis not present

## 2017-10-12 DIAGNOSIS — N2581 Secondary hyperparathyroidism of renal origin: Secondary | ICD-10-CM | POA: Diagnosis not present

## 2017-10-12 DIAGNOSIS — D631 Anemia in chronic kidney disease: Secondary | ICD-10-CM | POA: Diagnosis not present

## 2017-10-12 DIAGNOSIS — D689 Coagulation defect, unspecified: Secondary | ICD-10-CM | POA: Diagnosis not present

## 2017-10-12 DIAGNOSIS — E1129 Type 2 diabetes mellitus with other diabetic kidney complication: Secondary | ICD-10-CM | POA: Diagnosis not present

## 2017-10-12 MED FILL — VIMPAT 150 MG TABLET: 150 | 30 days supply | Qty: 60 | Fill #4

## 2017-10-12 MED FILL — PANTOPRAZOLE SOD DR 40 MG T: 40 | 90 days supply | Qty: 90 | Fill #0

## 2017-10-12 MED FILL — METOPROLOL TARTRATE 25 MG T: 25 | 90 days supply | Qty: 90 | Fill #0

## 2017-10-14 DIAGNOSIS — E1129 Type 2 diabetes mellitus with other diabetic kidney complication: Secondary | ICD-10-CM | POA: Diagnosis not present

## 2017-10-14 DIAGNOSIS — N186 End stage renal disease: Secondary | ICD-10-CM | POA: Diagnosis not present

## 2017-10-14 DIAGNOSIS — D509 Iron deficiency anemia, unspecified: Secondary | ICD-10-CM | POA: Diagnosis not present

## 2017-10-14 DIAGNOSIS — D689 Coagulation defect, unspecified: Secondary | ICD-10-CM | POA: Diagnosis not present

## 2017-10-14 DIAGNOSIS — D631 Anemia in chronic kidney disease: Secondary | ICD-10-CM | POA: Diagnosis not present

## 2017-10-14 DIAGNOSIS — N2581 Secondary hyperparathyroidism of renal origin: Secondary | ICD-10-CM | POA: Diagnosis not present

## 2017-10-15 DIAGNOSIS — E109 Type 1 diabetes mellitus without complications: Secondary | ICD-10-CM | POA: Diagnosis not present

## 2017-10-15 DIAGNOSIS — N186 End stage renal disease: Secondary | ICD-10-CM | POA: Diagnosis not present

## 2017-10-15 DIAGNOSIS — I10 Essential (primary) hypertension: Secondary | ICD-10-CM | POA: Diagnosis not present

## 2017-10-15 MED FILL — ACCU-CHEK GUIDE TEST STRIP: 90 days supply | Qty: 300 | Fill #0

## 2017-10-15 MED FILL — LANTUS SOLOSTAR 100 UNITS/M: 100 | 75 days supply | Qty: 9 | Fill #0

## 2017-10-16 DIAGNOSIS — D509 Iron deficiency anemia, unspecified: Secondary | ICD-10-CM | POA: Diagnosis not present

## 2017-10-16 DIAGNOSIS — N186 End stage renal disease: Secondary | ICD-10-CM | POA: Diagnosis not present

## 2017-10-16 DIAGNOSIS — E1129 Type 2 diabetes mellitus with other diabetic kidney complication: Secondary | ICD-10-CM | POA: Diagnosis not present

## 2017-10-16 DIAGNOSIS — D689 Coagulation defect, unspecified: Secondary | ICD-10-CM | POA: Diagnosis not present

## 2017-10-16 DIAGNOSIS — N2581 Secondary hyperparathyroidism of renal origin: Secondary | ICD-10-CM | POA: Diagnosis not present

## 2017-10-16 DIAGNOSIS — D631 Anemia in chronic kidney disease: Secondary | ICD-10-CM | POA: Diagnosis not present

## 2017-10-19 DIAGNOSIS — N186 End stage renal disease: Secondary | ICD-10-CM | POA: Diagnosis not present

## 2017-10-19 DIAGNOSIS — D689 Coagulation defect, unspecified: Secondary | ICD-10-CM | POA: Diagnosis not present

## 2017-10-19 DIAGNOSIS — D509 Iron deficiency anemia, unspecified: Secondary | ICD-10-CM | POA: Diagnosis not present

## 2017-10-19 DIAGNOSIS — E1129 Type 2 diabetes mellitus with other diabetic kidney complication: Secondary | ICD-10-CM | POA: Diagnosis not present

## 2017-10-19 DIAGNOSIS — N2581 Secondary hyperparathyroidism of renal origin: Secondary | ICD-10-CM | POA: Diagnosis not present

## 2017-10-20 ENCOUNTER — Encounter: Payer: Self-pay | Admitting: Neurology

## 2017-10-20 ENCOUNTER — Ambulatory Visit (INDEPENDENT_AMBULATORY_CARE_PROVIDER_SITE_OTHER): Payer: 59 | Admitting: Neurology

## 2017-10-20 VITALS — BP 142/75 | HR 72 | Wt 216.0 lb

## 2017-10-20 DIAGNOSIS — G40019 Localization-related (focal) (partial) idiopathic epilepsy and epileptic syndromes with seizures of localized onset, intractable, without status epilepticus: Secondary | ICD-10-CM | POA: Diagnosis not present

## 2017-10-20 NOTE — Patient Instructions (Addendum)
I had a long discussion the patient and his wife regarding his recurrent seizures which appear to be related to dialysis. Patient has previously failed Dilantin and is presently on Vimpat 150 twice daily and lamotrigine 150 twice daily. He is reluctant to increase medications at the present time has he feels seizures or dialysis related. Family is requesting second opinion hence I will refer the patient to Dr. Beverly Sessions epilepsy specialist for her opinion. I have advised him to avoid seizure provoking stimuli like sleep deprivation, medication noncompliance, irregular sleeping habits. Try to minimize fluid and blood pressure shifts during dialysis. He will return for follow-up in the future as necessary.

## 2017-10-20 NOTE — Progress Notes (Signed)
[facility]  PATIENT: [name] DOB: 23-Jun-1966   REASON FOR VISIT: Follow-up for stroke, right hemiparesis, seizure disorder with seizure 07/05/2017 HISTORY FROM: Patient and wife    HISTORY OF PRESENT ILLNESS:HISTORY PS: [name] is a 52 year old African-American gentleman seen today for first office follow-up visit following hospital consultation with Dr. Erlinda Hong in Oct 23, 2015. He is accompanied by his wife. He has a complicated PMH of DM type 2 with diabetic neuropathy, Sickle cell-thalassemia,  DVT and PE x 2 in 07/2011 and 1/ 2016 off coumadin due to GIB, OSA, CKD stage V, MGUS and anemia .He was recently admitted to [facility] in [place] on 09/16/15 with N/V and blood stools with acute on chronic renal failure due to acute pancreatitis. He was treated with supportive care, IV bicarb as well as multiple units PRBC and EGD with severe stage IV erosive esophagitis with gastritis.HD initiated on 01/09 due to poor recover with decrease in UOP. He developed hypoxic respiratory failure and was found to have RUL/RML PE as well as left popliteal DVT. He was treated with IV heparin. IVC filter was placed around 09/21/15 and post procedure was found to have right hand weakness. CT head on 09/26/15 reported possible subacut right MCA/PCA and left cerebellar infarcts which were not correlate to his right hand weakness. Neurology was consulted for input and EMG showed evidence of profound neuropathy. He had worsening of symptoms with increase in right sided weakness, facial droop speech difficulty on 09/27/15.MRI on 09/29/15 showedscattered right MCA and left MCA, left thalamus and b/l cerebellar infarcts. The infarcts on CT 09/26/15 likely to be acute too. On 09/30/15, he had worsening of aphasia and repeat MRI showed extension of left MCA frontal infarcts. MRA head showed left M2 superior branch occlusion.Echo with positive bubble study and  TEE positive for PFO. Hypercoagulation studies reported to be negative. IV heparin was changed to Lovenox due to recurrent embolic strokes on heparin.  He was then transferred here to [name] rehab on 10/12/15. He was reported to have small amount of blood in stool on admission. Continued HD for CKD after admission. Due to CKD on HD, his lovenox discontinued and put back on heparin IV. His hematologist Dr. Alen Blew was consulted for anticoagulation use. He commented "As far as long-term anticoagulation, I feel be reasonable to consider aspirin alone and hope that IVC filter will prevent any future embolic strokes from occurring. An alternative strategy is to consider warfarin and follow closely for signs or symptoms of bleeding." since admission, Patient made significant improvement on his aphasia and right side weakness, and has been able to walk with assistance and speak much more fluent. His H&H remained stable and no obvious bleeding reported. Dr. Erlinda Hong recommended starting warfarin. Patient has tolerated warfarin well without any more episodes of bleeding and hematocrit has remained stable. He is currently participating in outpatient physical, occupational and speech therapy and making good improvement. His able to states speaks short sentences now with some word hesitancy. He still has significant weakness in his right hand and grip but is able to walk with a cane. His balance is good is had no major falls. He does complain of pain and stiffness in his legs and hand particularly at night at times he has to wake up because of discomfort. He is being followed in the rehabilitation clinic but currently is not taking any medications for spasticity. His sugars have all been under good control. Update 03/25/2016 PS:  He returns for follow-up after last visit with me 3 months ago. He is accompanied today by his son. He was hospitalized on 4/312017 with a seizure. He was started on Vimpat 100 twice daily. EEG showed generalized  slowing without definite epileptiform activity. Limited MRI scan the brain was obtained on 12/3115 which showed multiple late subacute to chronic infarcts and her repeat MRI on 5/117 showed resolution and no definite new acute infarct. Patient was initially felt to be candidate for rehabilitation but after prolonged postictal phase for several days he made rapid improvement and was discharged home. Doing well. His able to walk without assistance. His 2 has mild expressive aphasia but speech is improving. Still has mild right-sided weakness. He walks with dragging his right leg. Is tolerating Vimpat well without side effects. He remains on warfarin and is tolerating it well without bleeding or bruising. He states his fasting sugars 7 well controlled. He has no new complaints. UPDATE 10/12/2017[name], 52 year old male returns for follow-up. With medical history of right and left  CVA in January 2017 end-stage renal disease previous pulmonary emboli, seizure disorder ,diabetes, hypertension previously on Coumadin. He was on vacation in [place] in September and admitted on 05/28/16 after a dialysis treatment he had a seizure event and was hospitalized he was also found to be anemic with a hemoglobin of 6.8 indicating positive stools in the emergency room he was admitted for complete workup. He received multiple blood transfusions.  He received a loading dose of Keppra in the emergency room and his Vimpat dose was increased to 150 mg twice daily. EEG performed was mildly abnormal due to the presence of mild intermittent generalized theta slowing which suggest mild encephalopathy of nonspecific etiology. He was hospital for 12 days and return to [place]. He denies further seizure events. He denies further stroke or TIA symptoms Continues  mild expressive aphasia and mild right-sided weakness. He returns for reevaluation.  UPDATE 11/16/2017[name], 52 year old male returns for follow-up he has a history  of right and left CVA in January 2017, end-stage renal disease on dialysis, seizure disorder, diabetes hypertension recent GI bleed while vacationing in [place] September 2017 after a dialysis treatment he had a seizure event and was hospitalized with hemoglobin of 6.8. His aspirin and Coumadin remain on hold. His GI doctor is Dr. Michail Sermon who he will see again in the next week or so. He is currently on Vimpat 150 twice daily. He had another seizure after dialysis on 07/14/2016 and was taken to the emergency room where he was placed on Dilantin 300 mg daily. Wife does not want him to be on Dilantin long term is wanting him to switch to a different medication. Fortunately he denies further stroke or TIA symptoms. He remains an speech and occupational therapy and he continues to have mild right hemiparesis and mild expressive aphasia. He returns to the office today for reevaluation  UPDATE 12/18/2017[name], 52 year old male returns for follow-up. He was seen in the emergency room on 08/20/16 for a seizure. He had an abscess of the right foreheadwhich  was I&Ded at that time. He remains on antibiotics. He is currently on Lamictal 50 in the morning and 100 at night along with Vimpat 150 twice daily. He is on Dilantin 300 however wife does not want him to be on medication long-term. His aspirin 0.81 has been restarted by  GI doctor,Dr. Michail Sermon. He has not had further stroke or TIA symptoms. His therapies have concluded. He  remains with a mild right hemiparesis and mild expressive aphasia. He returns for reevaluation Update 11/05/2016 PS; he returns for follow-up after last visit 2 months ago. He had a breakthrough seizure on 10/06/16 he was seen in the [facility] emergency room. There is no obvious trigger noted. He was asked to continue his home medications. The patient had just tapered Dilantin and stopped it a few days later. The patient's wife is accompanying him appears quite frustrated with patient  continuing to have seizures. She in fact requested to transfer the patient's care to Dr. Jannifer Franklin for after I had a long discussion with the patient and his wife regarding incidence of breakthrough seizures and discussion of a little but treatment options date have decided to stay with me. I offered increasing the dose of lamotrigine but the patient is scared of having dizziness and possible side effects and would like to wait and continue the current dose for now. The patient has opted some improvement in his speech but can talk more fluently now though his speech is quite hesitant. Still has mild right hemiparesis and spasticity and has to walk slowly. He is had no falls or injuries. Remains on Vimpat 150 twice daily and lamotrigine 150 mg twice daily. The patient does complain of orthostasis and presyncopal symptoms during dialysis when he stands up. He admits his blood pressure tends to run quite low. He is taking midodrine every day as well. He is on aspirin 81 mg and tolerating them well. UPDATE 05/24/2018[name], 52 year old male returns for follow-up with history of seizure disorder and stroke. He has end-stage renal disease on dialysis. He also has history of GI bleed. Recent colonoscopy was negative for bleeding. He is to stay on aspirin for now but Dr. Burt Knack wants to switch him to Plavix. He also has an appointment to discuss PFO closure. Last seizure activity was January . He remains on Vimpat 150 twice daily and Lamictal 150 twice daily without side effects. He continues to have mild right hemiparesis and spasticity. He denies any recent falls or injuries he has continued to have improvement in speech although he can be hesitant at times. Blood pressure in the office today 124/77 He returns for reevaluation  UPDATE 10/30/2018[name], 52 year old male returns for follow-up with history of seizure disorder and stroke in February 2017. He also has end-stage renal disease on dialysis history of  GI bleed. Since last seen he has seen Dr. Burt Knack who did not want to close PFO. He remains on aspirin for secondary stroke prevention. He was seen in the ER on 06/2817 for seizure event. CMP with potassium 5.5 and CBC with hemoglobin 10.6 hematocrit 29.1 and WBC mildly elevated at 12. He remains on Vimpat 150 twice daily and Lamictal 150 twice daily. Last seizure prior to this was in January 2018. He continues to have mild right hemiparesis and spasticity. He denies any falls. Hesitant speech at times. Blood pressure in the office today 155/88. He returns for reevaluation Update 10/20/2017 : He returns for follow-up after last visit 3 months ago. He's had 3 further seizures since that visit. One was the end of October 2018 as well as one in January both of which occurred after dialysis. He feels that when to much fluid is removed during his dialysis or too much blood that triggers a seizure. The seizures last for about 5-10 minutes and is shaking and not aware of her surroundings. His usually quite tired after this episode. In fact  had a third seizure last night and he did have seizures yesterday dialysis yesterday during the day. He felt quite tired after that. The patient was shaking for 7 minutes as per his wife. He became quite belligerent and agitated and wife had to calm him down. The patient has previously not responded well to Dilantin. He and his wife did not want to try any other new medications or increase the current dosages of lamotrigine 150 mg twice daily and Vimpat 150 mg twice daily. They would prefer that I rather refer him for a second opinion for epilepsy specialist. He continues to do well from stroke standpoint without recurrent stroke or TIA symptoms. He remains on aspirin just already well without bruising or bleeding. His blood pressure is under good control though it is borderline today at 142/75. His sugar 7 controlled.  REVIEW OF SYSTEMS: Full 14 system review of systems performed and  notable only for those listed, all others are neg:   Anemia, seizure, gait imbalance and all systems negative ALLERGIES: Allergies  Allergen Reactions  . Codeine Rash and Other (See Comments)    Unknown reaction (patient says it was more serious than just a rash, but he can't remember what happened)    HOME MEDICATIONS: Outpatient Medications Prior to Visit  Medication Sig Dispense Refill  . albuterol (PROAIR HFA) 108 (90 Base) MCG/ACT inhaler Inhale two puffs every 4-6 hours if needed for cough or wheeze 1 Inhaler 1  . aspirin EC 81 MG tablet Take 1 tablet (81 mg total) by mouth daily. 100 tablet 0  . calcium acetate (PHOSLO) 667 MG capsule Take 2,001 mg by mouth 3 (three) times daily with meals.     . Cetirizine HCl 10 MG CAPS Take 1 capsule by mouth daily.    . cinacalcet (SENSIPAR) 60 MG tablet Take 60 mg by mouth every other day.     . fluticasone furoate-vilanterol (BREO ELLIPTA) 100-25 MCG/INH AEPB Inhale one puff once daily to prevent cough or wheeze 60 each 5  . insulin glargine (LANTUS) 100 unit/mL SOPN Inject 8 Units into the skin at bedtime.     . lamoTRIgine (LAMICTAL) 150 MG tablet TAKE 1 TABLET BY MOUTH TWICE A DAY (Patient taking differently: TAKE 150 mg TABLET BY MOUTH TWICE A DAY) 180 tablet 1  . metoprolol tartrate (LOPRESSOR) 25 MG tablet Take 0.5 tablets (12.5 mg total) by mouth 2 (two) times daily. 60 tablet 0  . midodrine (PROAMATINE) 10 MG tablet Take 10 mg by mouth 2 (two) times daily with a meal.     . multivitamin (RENA-VIT) TABS tablet Take 1 tablet by mouth at bedtime. 30 tablet 1  . pantoprazole (PROTONIX) 40 MG tablet Take 1 tablet (40 mg total) by mouth 2 (two) times daily. (Patient taking differently: Take 40 mg by mouth every morning. ) 60 tablet 1  . VIMPAT 150 MG TABS TAKE 1 TABLET BY MOUTH TWICE DAILY (Patient taking differently: TAKE 150 mgTABLET BY MOUTH TWICE DAILY) 60 tablet 12   No facility-administered medications prior to visit.     PAST  MEDICAL HISTORY: Past Medical History:  Diagnosis Date  . A-fib ([place])   . Acute renal failure ([place]) 07/2011  . Anemia   . Asthma   . Diabetes mellitus without complication ([place])   . ESRD (end stage renal disease) on dialysis ([place])   . Gout   . History of recent blood transfusion 10/27/14   2 Units PRBC's  . Hyperkalemia 07/2011  . Hypertension   .  Monoclonal gammopathies   . Monoclonal gammopathy   . OSA on CPAP   . Pulmonary embolism ([place]) 07/2011   a. Tx with Coumadin for 6 months (unknown cause per patient).  . Pulmonary embolism ([place]) 07/2011; 09/27/2014   a. Bilat PE 07/2011 - unclear cause, tx with 6 months Coumadin.;   . Seizures ([place])   . Sickle cell-thalassemia disease ([place])    a. Sickle cell trait.  . Sleep apnea    a. uses CPAP.  [name] Stroke [name])     PAST SURGICAL HISTORY: Past Surgical History:  Procedure Laterality Date  . AV FISTULA PLACEMENT Right 12/05/2015   Procedure: INSERTION OF ARTERIOVENOUS (AV) GORE-TEX GRAFT ARM;  Surgeon: Elam Dutch, MD;  Location: [facility];  Service: Vascular;  Laterality: Right;  . BONE MARROW BIOPSY    . CHOLECYSTECTOMY    . CHOLECYSTECTOMY  1990's?  . COLONOSCOPY WITH PROPOFOL N/A 01/22/2017   Procedure: COLONOSCOPY WITH PROPOFOL;  Surgeon: Wilford Corner, MD;  Location: [facility];  Service: Endoscopy;  Laterality: N/A;  . EYE SURGERY Right   . INSERTION OF DIALYSIS CATHETER Right 10/16/2015   Procedure: INSERTION OF PALINDROME DIALYSIS CATHETER ;  Surgeon: Elam Dutch, MD;  Location: [facility];  Service: Vascular;  Laterality: Right;  . OTHER SURGICAL HISTORY     Retinal surgery  . PARS PLANA VITRECTOMY  02/17/2012   Procedure: PARS PLANA VITRECTOMY WITH 25 GAUGE;  Surgeon: Hayden Pedro, MD;  Location: [facility];  Service: Ophthalmology;  Laterality: Right;  . PERIPHERAL VASCULAR CATHETERIZATION N/A 03/20/2016   Procedure: A/V Shuntogram/Fistulagram;  Surgeon: Conrad [place], MD;  Location: [facility];  Service:  Cardiovascular;  Laterality: N/A;  . TEE WITHOUT CARDIOVERSION N/A 12/02/2016   Procedure: TRANSESOPHAGEAL ECHOCARDIOGRAM (TEE);  Surgeon: Larey Dresser, MD;  Location: [name];  Service: Cardiovascular;  Laterality: N/A;    FAMILY HISTORY: Family History  Problem Relation Age of Onset  . Hypertension Mother   . Diabetes Mother   . Stroke Father   . Sickle cell anemia Brother   . Diabetes type I Brother   . Kidney disease Brother     SOCIAL HISTORY: Social History   Socioeconomic History  . Marital status: Married    Spouse name: sylvia  . Number of children: 1  . Years of education: college  . Highest education level: Not on file  Social Needs  . Financial resource strain: Not on file  . Food insecurity - worry: Not on file  . Food insecurity - inability: Not on file  . Transportation needs - medical: Not on file  . Transportation needs - non-medical: Not on file  Occupational History  . Occupation: [employer]  Tobacco Use  . Smoking status: Never Smoker  . Smokeless tobacco: Never Used  Substance and Sexual Activity  . Alcohol use: No    Alcohol/week: 0.0 oz  . Drug use: No  . Sexual activity: Yes  Other Topics Concern  . Not on file  Social History Narrative   ** Merged History Encounter **         PHYSICAL EXAM  Vitals:   10/20/17 1318  BP: (!) 142/75  Pulse: 72  Weight: 216 lb (98 kg)   Body mass index is 30.99 kg/m.  Generalized: Pleasant middle-aged African-American male mildly Obese male in no acute distress  Head: normocephalic and atraumatic,.   Neck: Supple, no carotid bruits  Cardiac: Regular rate rhythm, no murmur  Musculoskeletal: No  deformity Skin fistula right forearm  for dialysis   Neurological examination   Mentation: Alert oriented to time, place, history taking. Attention span and concentration appropriate. Recent and remote memory intact.  Mild word finding difficulties but able to speak in sentences with  good comprehension    Cranial nerve II-XII: Pupils were equal round reactive to light extraocular movements were full, visual field were full on confrontational test. Mild right lower facial weakness. Facial sensation and strength were normal. hearing was intact to finger rubbing bilaterally. Uvula tongue midline. head turning and shoulder shrug were normal and symmetric.Tongue protrusion into cheek strength was normal. Motor: Right spastic hemiparesis 4 out of 5 in the right upper and lower extremity  Sensory: normal and symmetric to light touch, pinprick, and  Vibration, on the left diminished on the right Coordination: finger-nose-finger, heel-to-shin bilaterally, impaired on the right and normal on the left Reflexes: Symmetric upper and lower, plantar responses were flexor bilaterally. Gait and Station: Rising up from seated position without assistance, mild spastic hemiparetic gait no assistive device no difficulty with turns. Unable to do tandem walking DIAGNOSTIC DATA (LABS, IMAGING, TESTING) - I reviewed patient records, labs, notes, testing and imaging myself where available.  Lab Results  Component Value Date   WBC 12.0 (H) 07/05/2017   HGB 13.6 09/28/2017   HCT 40.0 09/28/2017   MCV 85.3 07/05/2017   PLT 301 07/05/2017      Component Value Date/Time   NA 139 09/28/2017 1806   NA 138 07/03/2017 0813   K 3.7 09/28/2017 1806   K 4.2 07/03/2017 0813   CL 96 (L) 09/28/2017 1806   CO2 25 07/05/2017 2317   CO2 27 07/03/2017 0813   GLUCOSE 122 (H) 09/28/2017 1806   GLUCOSE 76 07/03/2017 0813   BUN 21 (H) 09/28/2017 1806   BUN 46.4 (H) 07/03/2017 0813   CREATININE 7.60 (H) 09/28/2017 1806   CREATININE 10.9 (HH) 07/03/2017 0813   CALCIUM 8.5 (L) 07/05/2017 2317   CALCIUM 9.3 07/03/2017 0813   PROT 9.5 (H) 07/05/2017 2317   PROT 10.6 (H) 07/03/2017 0813   PROT 9.6 (H) 07/03/2017 0813   ALBUMIN 3.7 07/05/2017 2317   ALBUMIN 4.0 07/03/2017 0813   AST 35 07/05/2017 2317   AST  16 07/03/2017 0813   ALT 22 07/05/2017 2317   ALT 14 07/03/2017 0813   ALKPHOS 64 07/05/2017 2317   ALKPHOS 77 07/03/2017 0813   BILITOT 1.0 07/05/2017 2317   BILITOT 0.93 07/03/2017 0813   GFRNONAA 4 (L) 07/05/2017 2317   GFRAA 5 (L) 07/05/2017 2317   Lab Results  Component Value Date   CHOL 128 10/24/2015   HDL 48 10/24/2015   LDLCALC 62 10/24/2015   TRIG 88 10/24/2015   CHOLHDL 2.7 10/24/2015   Lab Results  Component Value Date   HGBA1C 4.4 (L) 01/06/2016   Lab Results  Component Value Date   GEXBMWUX32 440 10/24/2015   Lab Results  Component Value Date   TSH 0.896 01/06/2016     ASSESSMENT AND PLAN 52 year old African-American male with bilateral anterior and posterior circulation embolic infarcts in January 2017 secondary to paradoxical embolism from deep vein thrombosis, pulmonary embolism and patent foramen ovale while he was off Coumadin due to GI bleeding with prolonged hospitalization in [facility] but was transferred here for rehabilitation. Repeat admission in April 2017 for seizure. He has residual expressive aphasia and spastic right hemiparesis which are improving . Vascular risk factors of diabetes, hypertension, sleep apnea. Repeat  hospitalization in September 2017 [place] for seizure events after dialysis and GI bleed. Another seizure on 07/14/2016 after dialysis, seen in the emergency room and placed on Dilantin.Seen again in the ER on 08/20/16.  with seizure I&D of right forehead  for abscess. Another breakthrough seizure in January 2018. Other brief episodes of transient unresponsiveness following dialysis likely presyncopal events seizure events since last break through January further recurrent breakthrough seizures in October at 2018 and January 2019 as well as 1 last night I had a long discussion the patient and his wife regarding his recurrent seizures which appear to be related to dialysis. Patient has previously failed Dilantin and is  presently on Vimpat 150 twice daily and lamotrigine 150 twice daily. He is reluctant to increase medications at the present time has he feels seizures or dialysis related. Family is requesting second opinion hence I will refer the patient to Dr. Beverly Sessions epilepsy specialist for her opinion. I have advised him to avoid seizure provoking stimuli like sleep deprivation, medication noncompliance, irregular sleeping habits. Try to minimize fluid and blood pressure shifts during dialysis. He will return for follow-up in the future as necessary.  Antony Contras, MD [facility] [address] [place], [place] [address] [phone]

## 2017-10-21 ENCOUNTER — Encounter: Payer: Self-pay | Admitting: Neurology

## 2017-10-21 DIAGNOSIS — E1129 Type 2 diabetes mellitus with other diabetic kidney complication: Secondary | ICD-10-CM | POA: Diagnosis not present

## 2017-10-21 DIAGNOSIS — N186 End stage renal disease: Secondary | ICD-10-CM | POA: Diagnosis not present

## 2017-10-21 DIAGNOSIS — D689 Coagulation defect, unspecified: Secondary | ICD-10-CM | POA: Diagnosis not present

## 2017-10-21 DIAGNOSIS — N2581 Secondary hyperparathyroidism of renal origin: Secondary | ICD-10-CM | POA: Diagnosis not present

## 2017-10-21 DIAGNOSIS — D509 Iron deficiency anemia, unspecified: Secondary | ICD-10-CM | POA: Diagnosis not present

## 2017-10-21 NOTE — Progress Notes (Signed)
Letter put in mail for wife for her job.

## 2017-10-23 DIAGNOSIS — N2581 Secondary hyperparathyroidism of renal origin: Secondary | ICD-10-CM | POA: Diagnosis not present

## 2017-10-23 DIAGNOSIS — D689 Coagulation defect, unspecified: Secondary | ICD-10-CM | POA: Diagnosis not present

## 2017-10-23 DIAGNOSIS — D509 Iron deficiency anemia, unspecified: Secondary | ICD-10-CM | POA: Diagnosis not present

## 2017-10-23 DIAGNOSIS — N186 End stage renal disease: Secondary | ICD-10-CM | POA: Diagnosis not present

## 2017-10-23 DIAGNOSIS — E1129 Type 2 diabetes mellitus with other diabetic kidney complication: Secondary | ICD-10-CM | POA: Diagnosis not present

## 2017-10-26 DIAGNOSIS — D509 Iron deficiency anemia, unspecified: Secondary | ICD-10-CM | POA: Diagnosis not present

## 2017-10-26 DIAGNOSIS — N2581 Secondary hyperparathyroidism of renal origin: Secondary | ICD-10-CM | POA: Diagnosis not present

## 2017-10-26 DIAGNOSIS — E1129 Type 2 diabetes mellitus with other diabetic kidney complication: Secondary | ICD-10-CM | POA: Diagnosis not present

## 2017-10-26 DIAGNOSIS — D689 Coagulation defect, unspecified: Secondary | ICD-10-CM | POA: Diagnosis not present

## 2017-10-26 DIAGNOSIS — N186 End stage renal disease: Secondary | ICD-10-CM | POA: Diagnosis not present

## 2017-10-26 DIAGNOSIS — D631 Anemia in chronic kidney disease: Secondary | ICD-10-CM | POA: Diagnosis not present

## 2017-10-28 DIAGNOSIS — N2581 Secondary hyperparathyroidism of renal origin: Secondary | ICD-10-CM | POA: Diagnosis not present

## 2017-10-28 DIAGNOSIS — D509 Iron deficiency anemia, unspecified: Secondary | ICD-10-CM | POA: Diagnosis not present

## 2017-10-28 DIAGNOSIS — E1129 Type 2 diabetes mellitus with other diabetic kidney complication: Secondary | ICD-10-CM | POA: Diagnosis not present

## 2017-10-28 DIAGNOSIS — N186 End stage renal disease: Secondary | ICD-10-CM | POA: Diagnosis not present

## 2017-10-28 DIAGNOSIS — D631 Anemia in chronic kidney disease: Secondary | ICD-10-CM | POA: Diagnosis not present

## 2017-10-28 DIAGNOSIS — D689 Coagulation defect, unspecified: Secondary | ICD-10-CM | POA: Diagnosis not present

## 2017-10-29 ENCOUNTER — Ambulatory Visit (INDEPENDENT_AMBULATORY_CARE_PROVIDER_SITE_OTHER): Payer: 59 | Admitting: Neurology

## 2017-10-29 ENCOUNTER — Inpatient Hospital Stay: Payer: 59 | Attending: Oncology

## 2017-10-29 ENCOUNTER — Encounter: Payer: Self-pay | Admitting: Neurology

## 2017-10-29 ENCOUNTER — Ambulatory Visit (HOSPITAL_COMMUNITY)
Admission: RE | Admit: 2017-10-29 | Discharge: 2017-10-29 | Disposition: A | Discharge status: Home / Self Care | Payer: 59 | Source: Ambulatory Visit | Attending: Oncology | Admitting: Oncology

## 2017-10-29 ENCOUNTER — Ambulatory Visit (HOSPITAL_COMMUNITY): Payer: Self-pay

## 2017-10-29 ENCOUNTER — Other Ambulatory Visit: Payer: Self-pay

## 2017-10-29 VITALS — BP 152/66 | HR 69 | Ht 71.0 in | Wt 216.0 lb

## 2017-10-29 DIAGNOSIS — Z8673 Personal history of transient ischemic attack (TIA), and cerebral infarction without residual deficits: Secondary | ICD-10-CM | POA: Diagnosis not present

## 2017-10-29 DIAGNOSIS — D472 Monoclonal gammopathy: Secondary | ICD-10-CM | POA: Insufficient documentation

## 2017-10-29 DIAGNOSIS — G40019 Localization-related (focal) (partial) idiopathic epilepsy and epileptic syndromes with seizures of localized onset, intractable, without status epilepticus: Secondary | ICD-10-CM | POA: Diagnosis not present

## 2017-10-29 LAB — CBC WITH DIFFERENTIAL/PLATELET
Basophils Absolute: 0.1 10*3/uL (ref 0.0–0.1)
Basophils Relative: 1 %
Eosinophils Absolute: 0.4 10*3/uL (ref 0.0–0.5)
Eosinophils Relative: 4 %
HCT: 32.7 % — ABNORMAL LOW (ref 38.4–49.9)
Hemoglobin: 11.8 g/dL — ABNORMAL LOW (ref 13.0–17.1)
Lymphocytes Relative: 27 %
Lymphs Abs: 2.5 10*3/uL (ref 0.9–3.3)
MCH: 30.6 pg (ref 27.2–33.4)
MCHC: 36.1 g/dL — ABNORMAL HIGH (ref 32.0–36.0)
MCV: 84.7 fL (ref 79.3–98.0)
Monocytes Absolute: 1 10*3/uL — ABNORMAL HIGH (ref 0.1–0.9)
Monocytes Relative: 11 %
Neutro Abs: 5.3 10*3/uL (ref 1.5–6.5)
Neutrophils Relative %: 57 %
Platelets: 251 10*3/uL (ref 140–400)
RBC: 3.86 MIL/uL — ABNORMAL LOW (ref 4.20–5.82)
RDW: 16.1 % — ABNORMAL HIGH (ref 11.0–14.6)
WBC: 9.4 10*3/uL (ref 4.0–10.3)

## 2017-10-29 LAB — COMPREHENSIVE METABOLIC PANEL
ALT: 8 U/L (ref 0–55)
AST: 11 U/L (ref 5–34)
Albumin: 3.8 g/dL (ref 3.5–5.0)
Alkaline Phosphatase: 100 U/L (ref 40–150)
Anion gap: 10 (ref 3–11)
BUN: 24 mg/dL (ref 7–26)
CO2: 27 mmol/L (ref 22–29)
Calcium: 9.5 mg/dL (ref 8.4–10.4)
Chloride: 99 mmol/L (ref 98–109)
Creatinine, Ser: 7.65 mg/dL (ref 0.70–1.30)
GFR calc Af Amer: 8 mL/min — ABNORMAL LOW (ref >60)
GFR calc non Af Amer: 7 mL/min — ABNORMAL LOW (ref >60)
Glucose, Bld: 132 mg/dL (ref 70–140)
Potassium: 4 mmol/L (ref 3.5–5.1)
Sodium: 136 mmol/L (ref 136–145)
Total Bilirubin: 0.9 mg/dL (ref 0.2–1.2)
Total Protein: 10.8 g/dL — ABNORMAL HIGH (ref 6.4–8.3)

## 2017-10-29 MED ORDER — LACOSAMIDE 150 MG PO TABS: ORAL_TABLET | ORAL | 11 refills | Status: DC

## 2017-10-29 NOTE — Progress Notes (Signed)
NEUROLOGY CONSULTATION NOTE  Abercrombie MRN: [id number] DOB: 06/10/1966  Referring provider: Dr. Antony Contras Primary care provider: Dr. [name]  Reason for consult:  Second opinion regarding seizures  Dear Dr Leonie Man:  Thank you for your kind referral of [facility] for consultation of the above symptoms. Although his history is well known to you, please allow me to reiterate it for the purpose of our medical record. The patient was accompanied to the clinic by his wife who also provides collateral information. Records and images were personally reviewed where available.  HISTORY OF PRESENT ILLNESS: This is a pleasant 52 year old right-handed man with a history of diabetes, sickle-cell thalassemia, DVT and PE off Coumadin due to GI bleed, sleep apnea, MGUS, CKD on dialysis, and strokes in multiple vascular distributions in January 2017 with subsequent seizures. Stroke workup showed left M2 superior branch occlusion, TEE with PFO, hypercoagulation studies negative. He is now on aspirin for secondary stroke prevention. He made significant improvement with right-sided weakness and speech was more fluent. He had a seizure in April 2017 and was started on Vimpat 127m BID. EEG reported diffuse slowing. Limited MRI brain showed multiple late subacute and chronic infarcts, and repeat imaging in May 2017 showed resolution with no new infarct. [place] and was found to be anemic with Hgb of 6.8, requiring multiple blood transfusions. Vimpat increased to 1518mBID. EEG showed mild intermittent generalized theta slowing. He had another seizure after dialysis in November 2017 and Dilantin 30041maily was added. His wife did not want him to be on this long term, and was switched to Lamotrigine. He had another seizure in January 2018 a few days after Dilantin was tapered off. He had another seizure in October 2018 again after dialysis.  The last seizure was on 10/19/17 again after dialysis. This was slightly different because he was belligerent and agitated and his wife had to calm him down. He is taking Vimpat 150m48mD and Lamotrigine 150mg39m with no side effects. He sometimes has a warning prior to the seizures. With the most recent seizure, he was standing at the kitchen table, recalls his wife picking him up, then coming to on the sofa. Seizures last 5-10 minutes, his wife describes staring off, unresponsive, then trembling/low amplitude shaking. He is tired after, with worsened right-sided weakness. He would be "pretty out of it" the next day. Majority of seizures occur right after dialysis, he would still be sitting in the dialysis unit, but the most recent seizure occurred at home an hour after dialysis. He reports BP is not an issue because his nephrologist has regulated his medications and BP is good most of the time.   He has occasional tremors in both legs, he has a right hand tremor noted in the office today. He noticed memory changes after his stroke, but does not think memory is worse after each seizure. He wonders about his short-term memory, sometimes he has to think which is his right or left side. He denies any olfactory/gustatory hallucinations, deja vu, rising epigastric sensation,myoclonic jerks.   Epilepsy Risk Factors:  Prior strokes in multiple vascular distributions. Otherwise he had a normal birth and early development.  There is no history of febrile convulsions, CNS infections such as meningitis/encephalitis, significant traumatic brain injury, neurosurgical procedures, or family history of seizures.  Diagnostic Data: I personally reviewed MRI brain without contrast done 01/2016 which showed left MCA  infarct, right posterior temporal and occipital hemorrhagic infarct, left frontal infarct with remote blood products, remote hemorrhagic infarct in the more superior right frontal lobe, remote nonhemorrhagic lacunar  infarcts in the left cerebellum.  Prior AEDs: Dilantin  PAST MEDICAL HISTORY: Past Medical History:  Diagnosis Date  . A-fib ([place])   . Acute renal failure ([place]) 07/2011  . Anemia   . Asthma   . Diabetes mellitus without complication ([place])   . ESRD (end stage renal disease) on dialysis ([place])   . Gout   . History of recent blood transfusion 10/27/14   2 Units PRBC's  . Hyperkalemia 07/2011  . Hypertension   . Monoclonal gammopathies   . Monoclonal gammopathy   . OSA on CPAP   . Pulmonary embolism ([place]) 07/2011   a. Tx with Coumadin for 6 months (unknown cause per patient).  . Pulmonary embolism ([place]) 07/2011; 09/27/2014   a. Bilat PE 07/2011 - unclear cause, tx with 6 months Coumadin.;   . Seizures ([place])   . Sickle cell-thalassemia disease ([place])    a. Sickle cell trait.  . Sleep apnea    a. uses CPAP.  [name] Stroke [facility])     PAST SURGICAL HISTORY: Past Surgical History:  Procedure Laterality Date  . AV FISTULA PLACEMENT Right 12/05/2015   Procedure: INSERTION OF ARTERIOVENOUS (AV) GORE-TEX GRAFT ARM;  Surgeon: Elam Dutch, MD;  Location: [place];  Service: Vascular;  Laterality: Right;  . BONE MARROW BIOPSY    . CHOLECYSTECTOMY    . CHOLECYSTECTOMY  1990's?  . COLONOSCOPY WITH PROPOFOL N/A 01/22/2017   Procedure: COLONOSCOPY WITH PROPOFOL;  Surgeon: Wilford Corner, MD;  Location: [facility] ENDOSCOPY;  Service: Endoscopy;  Laterality: N/A;  . EYE SURGERY Right   . INSERTION OF DIALYSIS CATHETER Right 10/16/2015   Procedure: INSERTION OF PALINDROME DIALYSIS CATHETER ;  Surgeon: Elam Dutch, MD;  Location: [place];  Service: Vascular;  Laterality: Right;  . OTHER SURGICAL HISTORY     Retinal surgery  . PARS PLANA VITRECTOMY  02/17/2012   Procedure: PARS PLANA VITRECTOMY WITH 25 GAUGE;  Surgeon: Hayden Pedro, MD;  Location: [place];  Service: Ophthalmology;  Laterality: Right;  . PERIPHERAL VASCULAR CATHETERIZATION N/A 03/20/2016   Procedure: A/V Shuntogram/Fistulagram;  Surgeon:  Conrad , MD;  Location: [place] CV LAB;  Service: Cardiovascular;  Laterality: N/A;  . TEE WITHOUT CARDIOVERSION N/A 12/02/2016   Procedure: TRANSESOPHAGEAL ECHOCARDIOGRAM (TEE);  Surgeon: Larey Dresser, MD;  Location: [facility] ENDOSCOPY;  Service: Cardiovascular;  Laterality: N/A;    MEDICATIONS: Current Outpatient Medications on File Prior to Visit  Medication Sig Dispense Refill  . albuterol (PROAIR HFA) 108 (90 Base) MCG/ACT inhaler Inhale two puffs every 4-6 hours if needed for cough or wheeze 1 Inhaler 1  . aspirin EC 81 MG tablet Take 1 tablet (81 mg total) by mouth daily. 100 tablet 0  . calcium acetate (PHOSLO) 667 MG capsule Take 2,001 mg by mouth 3 (three) times daily with meals.     . Cetirizine HCl 10 MG CAPS Take 1 capsule by mouth daily.    . cinacalcet (SENSIPAR) 60 MG tablet Take 60 mg by mouth every other day.     . fluticasone furoate-vilanterol (BREO ELLIPTA) 100-25 MCG/INH AEPB Inhale one puff once daily to prevent cough or wheeze 60 each 5  . insulin glargine (LANTUS) 100 unit/mL SOPN Inject 8 Units into the skin at bedtime.     . lamoTRIgine (LAMICTAL) 150 MG tablet TAKE  1 TABLET BY MOUTH TWICE A DAY (Patient taking differently: TAKE 150 mg TABLET BY MOUTH TWICE A DAY) 180 tablet 1  . metoprolol tartrate (LOPRESSOR) 25 MG tablet Take 0.5 tablets (12.5 mg total) by mouth 2 (two) times daily. 60 tablet 0  . midodrine (PROAMATINE) 10 MG tablet Take 10 mg by mouth 2 (two) times daily with a meal.     . multivitamin (RENA-VIT) TABS tablet Take 1 tablet by mouth at bedtime. 30 tablet 1  . pantoprazole (PROTONIX) 40 MG tablet Take 1 tablet (40 mg total) by mouth 2 (two) times daily. (Patient taking differently: Take 40 mg by mouth every morning. ) 60 tablet 1  . VIMPAT 150 MG TABS TAKE 1 TABLET BY MOUTH TWICE DAILY (Patient taking differently: TAKE 150 mgTABLET BY MOUTH TWICE DAILY) 60 tablet 12   No current facility-administered medications on file prior to visit.      ALLERGIES: Allergies  Allergen Reactions  . Codeine Rash and Other (See Comments)    Unknown reaction (patient says it was more serious than just a rash, but he can't remember what happened)    FAMILY HISTORY: Family History  Problem Relation Age of Onset  . Hypertension Mother   . Diabetes Mother   . Stroke Father   . Sickle cell anemia Brother   . Diabetes type I Brother   . Kidney disease Brother     SOCIAL HISTORY: Social History   Socioeconomic History  . Marital status: Married    Spouse name: sylvia  . Number of children: 1  . Years of education: college  . Highest education level: Not on file  Social Needs  . Financial resource strain: Not on file  . Food insecurity - worry: Not on file  . Food insecurity - inability: Not on file  . Transportation needs - medical: Not on file  . Transportation needs - non-medical: Not on file  Occupational History  . Occupation: [employer]  Tobacco Use  . Smoking status: Never Smoker  . Smokeless tobacco: Never Used  Substance and Sexual Activity  . Alcohol use: No    Alcohol/week: 0.0 oz  . Drug use: No  . Sexual activity: Yes  Other Topics Concern  . Not on file  Social History Narrative   ** Merged History Encounter **        REVIEW OF SYSTEMS: Constitutional: No fevers, chills, or sweats, no generalized fatigue, change in appetite Eyes: No visual changes, double vision, eye pain Ear, nose and throat: No hearing loss, ear pain, nasal congestion, sore throat Cardiovascular: No chest pain, palpitations Respiratory:  No shortness of breath at rest or with exertion, wheezes GastrointestinaI: No nausea, vomiting, diarrhea, abdominal pain, fecal incontinence Genitourinary:  No dysuria, urinary retention or frequency Musculoskeletal:  No neck pain, back pain Integumentary: No rash, pruritus, skin lesions Neurological: as above Psychiatric: No depression, insomnia, anxiety Endocrine: No palpitations,  fatigue, diaphoresis, mood swings, change in appetite, change in weight, increased thirst Hematologic/Lymphatic:  No anemia, purpura, petechiae. Allergic/Immunologic: no itchy/runny eyes, nasal congestion, recent allergic reactions, rashes  PHYSICAL EXAM: Vitals:   10/29/17 1154  BP: (!) 152/66  Pulse: 69  SpO2: 94%   General: No acute distress Head:  Normocephalic/atraumatic Eyes: Fundoscopic exam shows bilateral sharp discs, no vessel changes, exudates, or hemorrhages Neck: supple, no paraspinal tenderness, full range of motion Back: No paraspinal tenderness Heart: regular rate and rhythm Lungs: Clear to auscultation bilaterally. Vascular: No carotid bruits. Skin/Extremities: No rash, no  edema Neurological Exam: Mental status: alert and oriented to person, place, and time, no dysarthria, mild word-finding difficulties, Fund of knowledge is appropriate.  Recent and remote memory are intact. 2/3 delayed recall. Attention and concentration are normal. Difficulty spelling WORLD backward (DOLD).  Able to name objects and repeat phrases. Cranial nerves: CN I: not tested CN II: pupils equal, round and reactive to light, right homonymous hemianopia, fundi unremarkable. CN III, IV, VI:  full range of motion, no nystagmus, no ptosis CN V: facial sensation intact CN VII: right lower facial weakness CN VIII: hearing intact to finger rub CN IX, X: gag intact, uvula midline CN XI: sternocleidomastoid and trapezius muscles intact CN XII: tongue midline Bulk & Tone: normal, no fasciculations. Motor: 5/5 on left UE and LE, 5/5 right LE, spastic hemiparesis of right UE 5/5 proximally, 4/5 distally, decreased FFM on right hand Sensation: intact to light touch, cold, pin, vibration and joint position sense.  No extinction to double simultaneous stimulation.  Romberg test negative Deep Tendon Reflexes: +2 throughout, no ankle clonus Plantar responses: downgoing bilaterally Cerebellar: no  incoordination on finger to nose but difficulty with right hand Gait: narrow-based and steady, able to tandem walk adequately. Tremor: endpoint tremor bilaterally  IMPRESSION: This is a pleasant 52 year old right-handed man with a history of  diabetes, sickle-cell thalassemia, DVT and PE off Coumadin due to GI bleed, sleep apnea, MGUS, CKD on dialysis, and strokes in multiple vascular distributions in January 2017 with subsequent seizures. He has residual right-sided weakness, and reports worsening right-sided weakness after seizures. His seizures occur soon after dialysis, suggestive of focal seizures with staring/unresponsiveness and Todd's paralysis on the right afterwards. We will try giving an additional dose of Vimpat 114m after each dialysis session. If he is too dizzy with this, we will try an additional 1073mdose. Continue Vimpat 1508mID and Lamotrigine 150m19mD. If seizures continue with this measure, we will do a 48-hour EEG to further classify his seizures. [place] driving laws were discussed with the patient, and he knows to stop driving after a seizure, until 6 months seizure-free. He will keep a seizure calendar which we will review in follow-up in 4 months then he will continue follow-up with his regular neurologist Dr. SethLeonie Maney know to call for any issues.   Thank you for allowing me to participate in the care of this patient. Please do not hesitate to call for any questions or concerns.   KareEllouise NewerD.  CC: Dr. SethLeonie Man. [facility]

## 2017-10-29 NOTE — Patient Instructions (Addendum)
1. Take Vimpat 152m twice a day, then take additional 1512mdose after each dialysis 2. Continue Lamotrigine 15085mwice a day 3. Continue to keep a calendar of your seizures, follow-up in 4 months, call for any changes  Seizure Precautions: 1. If medication has been prescribed for you to prevent seizures, take it exactly as directed.  Do not stop taking the medicine without talking to your doctor first, even if you have not had a seizure in a long time.   2. Avoid activities in which a seizure would cause danger to yourself or to others.  Don't operate dangerous machinery, swim alone, or climb in high or dangerous places, such as on ladders, roofs, or girders.  Do not drive unless your doctor says you may.  3. If you have any warning that you may have a seizure, lay down in a safe place where you can't hurt yourself.    4.  No driving for 6 months from last seizure, [facility] Please refer to the following link on the [employer] for more information: http://www.epilepsyfoundation.org/answerplace/Social/driving/drivingu.cfm   5.  Maintain good sleep hygiene. Avoid alcohol.  6.  Contact your doctor if you have any problems that may be related to the medicine you are taking.  7.  Call 911 and bring the patient back to the ED if:        A.  The seizure lasts longer than 5 minutes.       B.  The patient doesn't awaken shortly after the seizure  C.  The patient has new problems such as difficulty seeing, speaking or moving  D.  The patient was injured during the seizure  E.  The patient has a temperature over 102 F (39C)  F.  The patient vomited and now is having trouble breathing

## 2017-10-30 DIAGNOSIS — N2581 Secondary hyperparathyroidism of renal origin: Secondary | ICD-10-CM | POA: Diagnosis not present

## 2017-10-30 DIAGNOSIS — D509 Iron deficiency anemia, unspecified: Secondary | ICD-10-CM | POA: Diagnosis not present

## 2017-10-30 DIAGNOSIS — N186 End stage renal disease: Secondary | ICD-10-CM | POA: Diagnosis not present

## 2017-10-30 DIAGNOSIS — E1129 Type 2 diabetes mellitus with other diabetic kidney complication: Secondary | ICD-10-CM | POA: Diagnosis not present

## 2017-10-30 DIAGNOSIS — D689 Coagulation defect, unspecified: Secondary | ICD-10-CM | POA: Diagnosis not present

## 2017-10-30 DIAGNOSIS — D631 Anemia in chronic kidney disease: Secondary | ICD-10-CM | POA: Diagnosis not present

## 2017-10-30 LAB — KAPPA/LAMBDA LIGHT CHAINS
Kappa free light chain: 939.9 mg/L — ABNORMAL HIGH (ref 3.3–19.4)
Kappa, lambda light chain ratio: 10.79 — ABNORMAL HIGH (ref 0.26–1.65)
Lambda free light chains: 87.1 mg/L — ABNORMAL HIGH (ref 5.7–26.3)

## 2017-11-02 ENCOUNTER — Encounter: Payer: Self-pay | Admitting: Neurology

## 2017-11-02 DIAGNOSIS — N186 End stage renal disease: Secondary | ICD-10-CM | POA: Diagnosis not present

## 2017-11-02 DIAGNOSIS — N2581 Secondary hyperparathyroidism of renal origin: Secondary | ICD-10-CM | POA: Diagnosis not present

## 2017-11-02 DIAGNOSIS — G40019 Localization-related (focal) (partial) idiopathic epilepsy and epileptic syndromes with seizures of localized onset, intractable, without status epilepticus: Secondary | ICD-10-CM

## 2017-11-02 DIAGNOSIS — D689 Coagulation defect, unspecified: Secondary | ICD-10-CM | POA: Diagnosis not present

## 2017-11-02 DIAGNOSIS — E1129 Type 2 diabetes mellitus with other diabetic kidney complication: Secondary | ICD-10-CM | POA: Diagnosis not present

## 2017-11-02 DIAGNOSIS — D509 Iron deficiency anemia, unspecified: Secondary | ICD-10-CM | POA: Diagnosis not present

## 2017-11-02 HISTORY — DX: Localization-related (focal) (partial) idiopathic epilepsy and epileptic syndromes with seizures of localized onset, intractable, without status epilepticus: G40.019

## 2017-11-02 LAB — MULTIPLE MYELOMA PANEL, SERUM
Albumin SerPl Elph-Mcnc: 4.2 g/dL (ref 2.9–4.4)
Albumin/Glob SerPl: 0.8 (ref 0.7–1.7)
Alpha 1: 0.3 g/dL (ref 0.0–0.4)
Alpha2 Glob SerPl Elph-Mcnc: 0.7 g/dL (ref 0.4–1.0)
B-Globulin SerPl Elph-Mcnc: 0.8 g/dL (ref 0.7–1.3)
Gamma Glob SerPl Elph-Mcnc: 4.2 g/dL — ABNORMAL HIGH (ref 0.4–1.8)
Globulin, Total: 6 g/dL — ABNORMAL HIGH (ref 2.2–3.9)
IgA: 62 mg/dL — ABNORMAL LOW (ref 90–386)
IgG (Immunoglobin G), Serum: 5026 mg/dL — ABNORMAL HIGH (ref 700–1600)
IgM (Immunoglobulin M), Srm: 28 mg/dL (ref 20–172)
M Protein SerPl Elph-Mcnc: 3.3 g/dL — ABNORMAL HIGH
Total Protein ELP: 10.2 g/dL — ABNORMAL HIGH (ref 6.0–8.5)

## 2017-11-02 MED FILL — FREESTYLE LITE TEST STRIP: 90 days supply | Qty: 300 | Fill #0

## 2017-11-02 MED FILL — FREESTYLE LITE METER: 20 days supply | Qty: 1 | Fill #0

## 2017-11-03 DIAGNOSIS — Z005 Encounter for examination of potential donor of organ and tissue: Secondary | ICD-10-CM | POA: Diagnosis not present

## 2017-11-04 ENCOUNTER — Telehealth: Payer: Self-pay

## 2017-11-04 DIAGNOSIS — E1129 Type 2 diabetes mellitus with other diabetic kidney complication: Secondary | ICD-10-CM | POA: Diagnosis not present

## 2017-11-04 DIAGNOSIS — D689 Coagulation defect, unspecified: Secondary | ICD-10-CM | POA: Diagnosis not present

## 2017-11-04 DIAGNOSIS — D509 Iron deficiency anemia, unspecified: Secondary | ICD-10-CM | POA: Diagnosis not present

## 2017-11-04 DIAGNOSIS — N186 End stage renal disease: Secondary | ICD-10-CM | POA: Diagnosis not present

## 2017-11-04 DIAGNOSIS — N2581 Secondary hyperparathyroidism of renal origin: Secondary | ICD-10-CM | POA: Diagnosis not present

## 2017-11-04 NOTE — Telephone Encounter (Signed)
Spoke with [facility] and she wanted to cancel the appointment. And can come on a Tuesday or Thursday. Per 2/27 return phone messages.

## 2017-11-05 ENCOUNTER — Ambulatory Visit: Payer: Self-pay | Admitting: Oncology

## 2017-11-06 ENCOUNTER — Telehealth: Payer: Self-pay | Admitting: Neurology

## 2017-11-06 DIAGNOSIS — N186 End stage renal disease: Secondary | ICD-10-CM | POA: Diagnosis not present

## 2017-11-06 DIAGNOSIS — E1129 Type 2 diabetes mellitus with other diabetic kidney complication: Secondary | ICD-10-CM | POA: Diagnosis not present

## 2017-11-06 DIAGNOSIS — D631 Anemia in chronic kidney disease: Secondary | ICD-10-CM | POA: Diagnosis not present

## 2017-11-06 DIAGNOSIS — N2581 Secondary hyperparathyroidism of renal origin: Secondary | ICD-10-CM | POA: Diagnosis not present

## 2017-11-06 DIAGNOSIS — D509 Iron deficiency anemia, unspecified: Secondary | ICD-10-CM | POA: Diagnosis not present

## 2017-11-06 DIAGNOSIS — Z992 Dependence on renal dialysis: Secondary | ICD-10-CM | POA: Diagnosis not present

## 2017-11-06 DIAGNOSIS — E1122 Type 2 diabetes mellitus with diabetic chronic kidney disease: Secondary | ICD-10-CM | POA: Diagnosis not present

## 2017-11-06 DIAGNOSIS — D689 Coagulation defect, unspecified: Secondary | ICD-10-CM | POA: Diagnosis not present

## 2017-11-06 NOTE — Telephone Encounter (Signed)
Spoke with [facility] - they state that as long as the written Rx is for #75 and the instructions are on the Rx - including additional 3 doses/week that pt will be able to pick up when needed.

## 2017-11-06 NOTE — Telephone Encounter (Signed)
[name] called regarding patient's Vimpat medication. He is needing Vimpat when he goes to Dialysis. She said there is a precert issue? Please Call. Thanks

## 2017-11-09 DIAGNOSIS — N186 End stage renal disease: Secondary | ICD-10-CM | POA: Diagnosis not present

## 2017-11-09 DIAGNOSIS — N2581 Secondary hyperparathyroidism of renal origin: Secondary | ICD-10-CM | POA: Diagnosis not present

## 2017-11-09 DIAGNOSIS — D689 Coagulation defect, unspecified: Secondary | ICD-10-CM | POA: Diagnosis not present

## 2017-11-09 DIAGNOSIS — D509 Iron deficiency anemia, unspecified: Secondary | ICD-10-CM | POA: Diagnosis not present

## 2017-11-09 DIAGNOSIS — E1129 Type 2 diabetes mellitus with other diabetic kidney complication: Secondary | ICD-10-CM | POA: Diagnosis not present

## 2017-11-09 DIAGNOSIS — D631 Anemia in chronic kidney disease: Secondary | ICD-10-CM | POA: Diagnosis not present

## 2017-11-09 MED FILL — VIMPAT 150 MG TABLET: 150 | 30 days supply | Qty: 60 | Fill #0

## 2017-11-11 DIAGNOSIS — N186 End stage renal disease: Secondary | ICD-10-CM | POA: Diagnosis not present

## 2017-11-11 DIAGNOSIS — N2581 Secondary hyperparathyroidism of renal origin: Secondary | ICD-10-CM | POA: Diagnosis not present

## 2017-11-11 DIAGNOSIS — D631 Anemia in chronic kidney disease: Secondary | ICD-10-CM | POA: Diagnosis not present

## 2017-11-11 DIAGNOSIS — D689 Coagulation defect, unspecified: Secondary | ICD-10-CM | POA: Diagnosis not present

## 2017-11-11 DIAGNOSIS — D509 Iron deficiency anemia, unspecified: Secondary | ICD-10-CM | POA: Diagnosis not present

## 2017-11-11 DIAGNOSIS — E1129 Type 2 diabetes mellitus with other diabetic kidney complication: Secondary | ICD-10-CM | POA: Diagnosis not present

## 2017-11-12 DIAGNOSIS — N186 End stage renal disease: Secondary | ICD-10-CM | POA: Diagnosis not present

## 2017-11-12 DIAGNOSIS — N08 Glomerular disorders in diseases classified elsewhere: Secondary | ICD-10-CM | POA: Diagnosis not present

## 2017-11-12 DIAGNOSIS — E1122 Type 2 diabetes mellitus with diabetic chronic kidney disease: Secondary | ICD-10-CM | POA: Diagnosis not present

## 2017-11-12 DIAGNOSIS — R799 Abnormal finding of blood chemistry, unspecified: Secondary | ICD-10-CM | POA: Diagnosis not present

## 2017-11-12 DIAGNOSIS — I120 Hypertensive chronic kidney disease with stage 5 chronic kidney disease or end stage renal disease: Secondary | ICD-10-CM | POA: Diagnosis not present

## 2017-11-12 DIAGNOSIS — M109 Gout, unspecified: Secondary | ICD-10-CM | POA: Diagnosis not present

## 2017-11-12 DIAGNOSIS — R771 Abnormality of globulin: Secondary | ICD-10-CM | POA: Diagnosis not present

## 2017-11-12 MED FILL — MIDODRINE HCL 10 MG TABLET: 10 | 90 days supply | Qty: 180 | Fill #6

## 2017-11-13 DIAGNOSIS — N2581 Secondary hyperparathyroidism of renal origin: Secondary | ICD-10-CM | POA: Diagnosis not present

## 2017-11-13 DIAGNOSIS — D509 Iron deficiency anemia, unspecified: Secondary | ICD-10-CM | POA: Diagnosis not present

## 2017-11-13 DIAGNOSIS — N186 End stage renal disease: Secondary | ICD-10-CM | POA: Diagnosis not present

## 2017-11-13 DIAGNOSIS — D631 Anemia in chronic kidney disease: Secondary | ICD-10-CM | POA: Diagnosis not present

## 2017-11-13 DIAGNOSIS — E1129 Type 2 diabetes mellitus with other diabetic kidney complication: Secondary | ICD-10-CM | POA: Diagnosis not present

## 2017-11-13 DIAGNOSIS — D689 Coagulation defect, unspecified: Secondary | ICD-10-CM | POA: Diagnosis not present

## 2017-11-13 MED FILL — BREO ELLIPTA 200-25 MCG INH: 200-25 | 30 days supply | Qty: 60 | Fill #0

## 2017-11-16 DIAGNOSIS — N186 End stage renal disease: Secondary | ICD-10-CM | POA: Diagnosis not present

## 2017-11-16 DIAGNOSIS — D509 Iron deficiency anemia, unspecified: Secondary | ICD-10-CM | POA: Diagnosis not present

## 2017-11-16 DIAGNOSIS — D689 Coagulation defect, unspecified: Secondary | ICD-10-CM | POA: Diagnosis not present

## 2017-11-16 DIAGNOSIS — E1129 Type 2 diabetes mellitus with other diabetic kidney complication: Secondary | ICD-10-CM | POA: Diagnosis not present

## 2017-11-16 DIAGNOSIS — N2581 Secondary hyperparathyroidism of renal origin: Secondary | ICD-10-CM | POA: Diagnosis not present

## 2017-11-18 DIAGNOSIS — N2581 Secondary hyperparathyroidism of renal origin: Secondary | ICD-10-CM | POA: Diagnosis not present

## 2017-11-18 DIAGNOSIS — E1129 Type 2 diabetes mellitus with other diabetic kidney complication: Secondary | ICD-10-CM | POA: Diagnosis not present

## 2017-11-18 DIAGNOSIS — D509 Iron deficiency anemia, unspecified: Secondary | ICD-10-CM | POA: Diagnosis not present

## 2017-11-18 DIAGNOSIS — D689 Coagulation defect, unspecified: Secondary | ICD-10-CM | POA: Diagnosis not present

## 2017-11-18 DIAGNOSIS — N186 End stage renal disease: Secondary | ICD-10-CM | POA: Diagnosis not present

## 2017-11-20 DIAGNOSIS — D509 Iron deficiency anemia, unspecified: Secondary | ICD-10-CM | POA: Diagnosis not present

## 2017-11-20 DIAGNOSIS — D689 Coagulation defect, unspecified: Secondary | ICD-10-CM | POA: Diagnosis not present

## 2017-11-20 DIAGNOSIS — N186 End stage renal disease: Secondary | ICD-10-CM | POA: Diagnosis not present

## 2017-11-20 DIAGNOSIS — E1129 Type 2 diabetes mellitus with other diabetic kidney complication: Secondary | ICD-10-CM | POA: Diagnosis not present

## 2017-11-20 DIAGNOSIS — N2581 Secondary hyperparathyroidism of renal origin: Secondary | ICD-10-CM | POA: Diagnosis not present

## 2017-11-23 ENCOUNTER — Emergency Department (HOSPITAL_COMMUNITY)
Admission: EM | Admit: 2017-11-23 | Discharge: 2017-11-23 | Disposition: A | Discharge status: Home / Self Care | Payer: 59 | Attending: Emergency Medicine | Admitting: Emergency Medicine

## 2017-11-23 ENCOUNTER — Encounter (HOSPITAL_COMMUNITY): Payer: Self-pay

## 2017-11-23 ENCOUNTER — Other Ambulatory Visit: Payer: Self-pay

## 2017-11-23 ENCOUNTER — Telehealth: Payer: Self-pay | Admitting: Neurology

## 2017-11-23 DIAGNOSIS — D631 Anemia in chronic kidney disease: Secondary | ICD-10-CM | POA: Diagnosis not present

## 2017-11-23 DIAGNOSIS — Z7982 Long term (current) use of aspirin: Secondary | ICD-10-CM | POA: Diagnosis not present

## 2017-11-23 DIAGNOSIS — I5042 Chronic combined systolic (congestive) and diastolic (congestive) heart failure: Secondary | ICD-10-CM | POA: Insufficient documentation

## 2017-11-23 DIAGNOSIS — N186 End stage renal disease: Secondary | ICD-10-CM | POA: Diagnosis not present

## 2017-11-23 DIAGNOSIS — D509 Iron deficiency anemia, unspecified: Secondary | ICD-10-CM | POA: Diagnosis not present

## 2017-11-23 DIAGNOSIS — Z794 Long term (current) use of insulin: Secondary | ICD-10-CM | POA: Diagnosis not present

## 2017-11-23 DIAGNOSIS — E1122 Type 2 diabetes mellitus with diabetic chronic kidney disease: Secondary | ICD-10-CM | POA: Insufficient documentation

## 2017-11-23 DIAGNOSIS — Z992 Dependence on renal dialysis: Secondary | ICD-10-CM | POA: Insufficient documentation

## 2017-11-23 DIAGNOSIS — G40909 Epilepsy, unspecified, not intractable, without status epilepticus: Secondary | ICD-10-CM | POA: Diagnosis not present

## 2017-11-23 DIAGNOSIS — I132 Hypertensive heart and chronic kidney disease with heart failure and with stage 5 chronic kidney disease, or end stage renal disease: Secondary | ICD-10-CM | POA: Insufficient documentation

## 2017-11-23 DIAGNOSIS — Z79899 Other long term (current) drug therapy: Secondary | ICD-10-CM | POA: Diagnosis not present

## 2017-11-23 DIAGNOSIS — N2581 Secondary hyperparathyroidism of renal origin: Secondary | ICD-10-CM | POA: Diagnosis not present

## 2017-11-23 DIAGNOSIS — I10 Essential (primary) hypertension: Secondary | ICD-10-CM | POA: Diagnosis not present

## 2017-11-23 DIAGNOSIS — R569 Unspecified convulsions: Secondary | ICD-10-CM | POA: Insufficient documentation

## 2017-11-23 DIAGNOSIS — E1129 Type 2 diabetes mellitus with other diabetic kidney complication: Secondary | ICD-10-CM | POA: Diagnosis not present

## 2017-11-23 DIAGNOSIS — J45909 Unspecified asthma, uncomplicated: Secondary | ICD-10-CM | POA: Insufficient documentation

## 2017-11-23 DIAGNOSIS — H5212 Myopia, left eye: Secondary | ICD-10-CM | POA: Diagnosis not present

## 2017-11-23 DIAGNOSIS — D689 Coagulation defect, unspecified: Secondary | ICD-10-CM | POA: Diagnosis not present

## 2017-11-23 LAB — BASIC METABOLIC PANEL
Anion gap: 13 (ref 5–15)
BUN: 20 mg/dL (ref 6–20)
CO2: 27 mmol/L (ref 22–32)
Calcium: 8.3 mg/dL — ABNORMAL LOW (ref 8.9–10.3)
Chloride: 95 mmol/L — ABNORMAL LOW (ref 101–111)
Creatinine, Ser: 6.5 mg/dL — ABNORMAL HIGH (ref 0.61–1.24)
GFR calc Af Amer: 10 mL/min — ABNORMAL LOW (ref >60)
GFR calc non Af Amer: 9 mL/min — ABNORMAL LOW (ref >60)
Glucose, Bld: 135 mg/dL — ABNORMAL HIGH (ref 65–99)
Potassium: 3.4 mmol/L — ABNORMAL LOW (ref 3.5–5.1)
Sodium: 135 mmol/L (ref 135–145)

## 2017-11-23 LAB — CBC WITH DIFFERENTIAL/PLATELET
Basophils Absolute: 0.1 10*3/uL (ref 0.0–0.1)
Basophils Relative: 1 %
Eosinophils Absolute: 0 10*3/uL (ref 0.0–0.7)
Eosinophils Relative: 0 %
HCT: 27.6 % — ABNORMAL LOW (ref 39.0–52.0)
Hemoglobin: 10 g/dL — ABNORMAL LOW (ref 13.0–17.0)
Lymphocytes Relative: 7 %
Lymphs Abs: 0.8 10*3/uL (ref 0.7–4.0)
MCH: 29.9 pg (ref 26.0–34.0)
MCHC: 36.2 g/dL — ABNORMAL HIGH (ref 30.0–36.0)
MCV: 82.4 fL (ref 78.0–100.0)
Monocytes Absolute: 0.2 10*3/uL (ref 0.1–1.0)
Monocytes Relative: 2 %
Neutro Abs: 11 10*3/uL — ABNORMAL HIGH (ref 1.7–7.7)
Neutrophils Relative %: 90 %
Platelets: 244 10*3/uL (ref 150–400)
RBC: 3.35 MIL/uL — ABNORMAL LOW (ref 4.22–5.81)
RDW: 17.9 % — ABNORMAL HIGH (ref 11.5–15.5)
WBC: 12.1 10*3/uL — ABNORMAL HIGH (ref 4.0–10.5)

## 2017-11-23 MED ORDER — LAMOTRIGINE 150 MG PO TABS
150.0000 mg | ORAL_TABLET | Freq: Once | ORAL | Status: AC
  Administered 2017-11-23: 150 mg via ORAL
  Filled 2017-11-23: qty 1

## 2017-11-23 MED ORDER — LACOSAMIDE 50 MG PO TABS
150.0000 mg | ORAL_TABLET | Freq: Once | ORAL | Status: DC
  Filled 2017-11-23 (×2): qty 3

## 2017-11-23 NOTE — ED Triage Notes (Signed)
Pt arrives to ED from dialysis after completing treatment with complaints of tonic clonic seizure lasting 3-4 mins. EMS reports pt has hx of epilepsy and does take medicine for it. Pt suffered damage to tongue, postictal for EMS upon arrival. Pt has hx of CVA, right sided residual. Pt placed in position of comfort with bed locked and lowered, call bell in reach.

## 2017-11-23 NOTE — ED Notes (Signed)
IV team bedside. 

## 2017-11-23 NOTE — ED Provider Notes (Signed)
[facility] EMERGENCY DEPARTMENT Provider Note   CSN: [id number] Arrival date & time: 11/23/17  1549     History   Chief Complaint Chief Complaint  Patient presents with  . Seizures    HPI Isaiah [name] is a 52 y.o. male.  HPI   52 year old male with seizure.  He has a prior history of seizure disorder.  He reports that he is finishing his dialysis session today when he had a generalized tonic-clonic seizure witnessed by staff.  Last approximately couple minutes and then resolved.  Appear to be postictal on EMS arrival.  He did bite his tongue.  I evaluated him in the emergency room he was back to his baseline.  He had no acute complaints.  Reports that his last seizure was about a month ago.  He states that he often gets them when he is finishing dialysis.  He is on Lamictal and Vimpat.  He reports compliance.  Past Medical History:  Diagnosis Date  . A-fib ([place])   . Acute renal failure ([place]) 07/2011  . Anemia   . Asthma   . Diabetes mellitus without complication ([place])   . ESRD (end stage renal disease) on dialysis ([place])   . Gout   . History of recent blood transfusion 10/27/14   2 Units PRBC's  . Hyperkalemia 07/2011  . Hypertension   . Monoclonal gammopathies   . Monoclonal gammopathy   . OSA on CPAP   . Pulmonary embolism ([place]) 07/2011   a. Tx with Coumadin for 6 months (unknown cause per patient).  . Pulmonary embolism ([place]) 07/2011; 09/27/2014   a. Bilat PE 07/2011 - unclear cause, tx with 6 months Coumadin.;   . Seizures ([place])   . Sickle cell-thalassemia disease ([place])    a. Sickle cell trait.  . Sleep apnea    a. uses CPAP.  [name] Stroke [facility])     Patient Active Problem List   Diagnosis Date Noted  . Partial idiopathic epilepsy with seizures of localized onset, intractable, without status epilepticus ([place]) 11/02/2017  . Iron deficiency anemia, unspecified 01/22/2017  . Hemiparesis affecting right side as late effect of cerebrovascular  accident ([place]) 02/28/2016  . Aphasia complicating stroke 60/73/7106  . Acute encephalopathy 01/08/2016  . Seizures ([place]) 01/08/2016  . History of ischemic left MCA stroke 01/08/2016  . Right sided weakness 01/08/2016  . Chronic combined systolic and diastolic CHF (congestive heart failure) ([place]) 01/08/2016  . Atrial fibrillation ([place]) 01/08/2016  . Leukocytosis 01/08/2016  . Benign essential HTN 01/08/2016  . Anemia of chronic disease 01/08/2016  . Controlled type 2 diabetes mellitus with diabetic nephropathy, without long-term current use of insulin ([place]) 01/08/2016  . History of DVT (deep vein thrombosis) 01/08/2016  . ESRD (end stage renal disease) on dialysis [name])     Past Surgical History:  Procedure Laterality Date  . AV FISTULA PLACEMENT Right 12/05/2015   Procedure: INSERTION OF ARTERIOVENOUS (AV) GORE-TEX GRAFT ARM;  Surgeon: Elam Dutch, MD;  Location: [facility];  Service: Vascular;  Laterality: Right;  . BONE MARROW BIOPSY    . CHOLECYSTECTOMY    . CHOLECYSTECTOMY  1990's?  . COLONOSCOPY WITH PROPOFOL N/A 01/22/2017   Procedure: COLONOSCOPY WITH PROPOFOL;  Surgeon: Wilford Corner, MD;  Location: [facility] ENDOS[facility];  Service: Endoscopy;  Laterality: N/A;  . EYE SURGERY Right   . INSERTION OF DIALYSIS CATHETER Right 10/16/2015   Procedure: INSERTION OF PALINDROME DIALYSIS CATHETER ;  Surgeon: Elam Dutch, MD;  [facility]  OR;  Service: Vascular;  Laterality: Right;  . OTHER SURGICAL HISTORY     Retinal surgery  . PARS PLANA VITRECTOMY  02/17/2012   Procedure: PARS PLANA VITRECTOMY WITH 25 GAUGE;  Surgeon: Hayden Pedro, MD;  Location: [facility];  Service: Ophthalmology;  Laterality: Right;  . PERIPHERAL VASCULAR CATHETERIZATION N/A 03/20/2016   Procedure: A/V Shuntogram/Fistulagram;  Surgeon: Conrad Bellemeade, MD;  Location: [facility] CV LAB;  Service: Cardiovascular;  Laterality: N/A;  . TEE WITHOUT CARDIOVERSION N/A 12/02/2016   Procedure: TRANSESOPHAGEAL ECHOCARDIOGRAM (TEE);   Surgeon: Larey Dresser, MD;  Location: [facility];  Service: Cardiovascular;  Laterality: N/A;       Home Medications    Prior to Admission medications   Medication Sig Start Date End Date Taking? Authorizing Provider  albuterol (PROAIR HFA) 108 (90 Base) MCG/ACT inhaler Inhale two puffs [name] 4-6 hours if needed for cough or wheeze 04/08/16   Kozlow, Donnamarie Poag, MD  aspirin EC 81 MG tablet Take 1 tablet (81 mg total) by mouth daily. 11/02/15   LoveIvan Anchors, PA-C  calcium acetate (PHOSLO) 667 MG capsule Take 2,001 mg by mouth 3 (three) times daily with meals.  11/24/16   [provider]  Cetirizine HCl 10 MG CAPS Take 1 capsule by mouth daily.    [provider]  cinacalcet (SENSIPAR) 60 MG tablet Take 60 mg by mouth [name] other day.  12/27/15   [provider]  fluticasone furoate-vilanterol (BREO ELLIPTA) 100-25 MCG/INH AEPB Inhale one puff once daily to prevent cough or wheeze 04/08/16   Kozlow, Donnamarie Poag, MD  insulin glargine (LANTUS) 100 unit/mL SOPN Inject 8 Units into the skin at bedtime.     [provider]  Lacosamide (VIMPAT) 150 MG TABS Take 1 tablet twice a day. Take an additional 1 tablet after dialysis session three times a week. 10/29/17   Cameron Sprang, MD  lamoTRIgine (LAMICTAL) 150 MG tablet TAKE 1 TABLET BY MOUTH TWICE A DAY Patient taking differently: TAKE 150 mg TABLET BY MOUTH TWICE A DAY 09/09/17   Dennie Bible, NP  metoprolol tartrate (LOPRESSOR) 25 MG tablet Take 0.5 tablets (12.5 mg total) by mouth 2 (two) times daily. 01/10/16   [name], MD  midodrine (PROAMATINE) 10 MG tablet Take 10 mg by mouth 2 (two) times daily with a meal.     [provider]  multivitamin (RENA-VIT) TABS tablet Take 1 tablet by mouth at bedtime. 11/02/15   Love, Ivan Anchors, PA-C  pantoprazole (PROTONIX) 40 MG tablet Take 1 tablet (40 mg total) by mouth 2 (two) times daily. Patient taking differently: Take 40 mg by mouth [name] morning.  11/02/15    Love, Ivan Anchors, PA-C    Family History Family History  Problem Relation Age of Onset  . Hypertension Mother   . Diabetes Mother   . Stroke Father   . Sickle cell anemia Brother   . Diabetes type I Brother   . Kidney disease Brother     Social History Social History   Tobacco Use  . Smoking status: Never Smoker  . Smokeless tobacco: Never Used  Substance Use Topics  . Alcohol use: No    Alcohol/week: 0.0 oz  . Drug use: No     Allergies   Codeine   Review of Systems Review of Systems  All systems reviewed and negative, other than as noted in HPI.  Physical Exam Updated Vital Signs Ht 5' 11"  (1.803 m)   Wt  97.5 kg (215 lb)   BMI 29.99 kg/m   Physical Exam  Constitutional: He is oriented to person, place, and time. He appears well-developed and well-nourished. No distress.  HENT:  Head: Normocephalic.  Abrasion to L posterior tongue  Eyes: Conjunctivae are normal. Right eye exhibits no discharge. Left eye exhibits no discharge.  Neck: Neck supple.  Cardiovascular: Normal rate, regular rhythm and normal heart sounds. Exam reveals no gallop and no friction rub.  No murmur heard. Av fistula R forearm  Pulmonary/Chest: Effort normal and breath sounds normal. No respiratory distress.  Abdominal: Soft. He exhibits no distension. There is no tenderness.  Musculoskeletal: He exhibits no edema or tenderness.  Neurological: He is alert and oriented to person, place, and time. He displays normal reflexes. No cranial nerve deficit. He exhibits normal muscle tone.  Skin: Skin is warm and dry.  Psychiatric: He has a normal mood and affect. His behavior is normal. Thought content normal.  Nursing note and vitals reviewed.    ED Treatments / Results  Labs (all labs ordered are listed, but only abnormal results are displayed) Labs Reviewed  CBC WITH DIFFERENTIAL/PLATELET - Abnormal; Notable for the following components:      Result Value   WBC 12.1 (*)    RBC 3.35  (*)    Hemoglobin 10.0 (*)    HCT 27.6 (*)    MCHC 36.2 (*)    RDW 17.9 (*)    Neutro Abs 11.0 (*)    All other components within normal limits  BASIC METABOLIC PANEL - Abnormal; Notable for the following components:   Potassium 3.4 (*)    Chloride 95 (*)    Glucose, Bld 135 (*)    Creatinine, Ser 6.50 (*)    Calcium 8.3 (*)    GFR calc non Af Amer 9 (*)    GFR calc Af Amer 10 (*)    All other components within normal limits  LAMOTRIGINE LEVEL    EKG  EKG Interpretation  Date/Time:  Monday November 23 2017 16:12:08 EDT Ventricular Rate:  104 PR Interval:    QRS Duration: 85 QT Interval:  358 QTC Calculation: 471 R Axis:   74 Text Interpretation:  Sinus tachycardia Probable LVH with secondary repol abnrm Confirmed by Virgel Manifold [phone]) on 11/23/2017 4:55:04 PM       Radiology No results found.  Procedures Procedures (including critical care time)  Medications Ordered in ED Medications  lamoTRIgine (LAMICTAL) tablet 150 mg (150 mg Oral Given 11/23/17 1908)     Initial Impression / Assessment and Plan / ED Course  I have reviewed the triage vital signs and the nursing notes.  Pertinent labs & imaging results that were available during my care of the patient were reviewed by me and considered in my medical decision making (see chart for details).    52 year old male with seizures.  History of the same.  Back to baseline.  Electrolytes are okay.  Well.  Seizure happened at dialysis today which does seem to be a precipitant for him.  I doubt emergent condition.  Return precautions were discussed.  Outpatient follow-up with neurology otherwise.  Final Clinical Impressions(s) / ED Diagnoses   Final diagnoses:  Seizure [facility])    ED Discharge Orders    None       Virgel Manifold, MD 11/24/17 1706

## 2017-11-23 NOTE — ED Notes (Signed)
Pt verbalized understanding discharge instructions and denies any further needs or questions at this time. VS stable, ambulatory and steady gait.   

## 2017-11-23 NOTE — Telephone Encounter (Signed)
Pt's spouse called to let Dr Delice Lesch know pt had a seizure right after he got done with dialisis, is at the hospital now

## 2017-11-23 NOTE — ED Notes (Signed)
Pt's wife states she can give pt the vimpat to the pt when they get home, refused hospital med.

## 2017-11-24 LAB — LAMOTRIGINE LEVEL: Lamotrigine Lvl: 5.2 ug/mL (ref 2.0–20.0)

## 2017-11-24 MED ORDER — CLONAZEPAM 0.5 MG PO TABS: ORAL_TABLET | ORAL | 5 refills | Status: DC

## 2017-11-24 MED FILL — clonazePAM 0.5 MG TABS: 0.5 | 15 days supply | Qty: 15 | Fill #0

## 2017-11-24 NOTE — Telephone Encounter (Signed)
FYI It looks like he was taken to the ER

## 2017-11-24 NOTE — Telephone Encounter (Signed)
Spoke to patient's wife. He had just taken the additional Vimpat after dialysis, but still had the seizure as they were unhooking him from dialysis. Discussed trying clonazepam prior to dialysis session, as it is not dialyzed as much as his other AEDs. We will try low dose clonazepam 0.43m take 1 hour prior to dialysis. Side effects discussed with patient and wife, particularly sedation. Monitor, call for any issues. Rx sent to his pharmacy. Continue with Lamotrigine and Vimpat.

## 2017-11-25 DIAGNOSIS — D689 Coagulation defect, unspecified: Secondary | ICD-10-CM | POA: Diagnosis not present

## 2017-11-25 DIAGNOSIS — E1129 Type 2 diabetes mellitus with other diabetic kidney complication: Secondary | ICD-10-CM | POA: Diagnosis not present

## 2017-11-25 DIAGNOSIS — N2581 Secondary hyperparathyroidism of renal origin: Secondary | ICD-10-CM | POA: Diagnosis not present

## 2017-11-25 DIAGNOSIS — D509 Iron deficiency anemia, unspecified: Secondary | ICD-10-CM | POA: Diagnosis not present

## 2017-11-25 DIAGNOSIS — N186 End stage renal disease: Secondary | ICD-10-CM | POA: Diagnosis not present

## 2017-11-25 DIAGNOSIS — D631 Anemia in chronic kidney disease: Secondary | ICD-10-CM | POA: Diagnosis not present

## 2017-11-27 DIAGNOSIS — D689 Coagulation defect, unspecified: Secondary | ICD-10-CM | POA: Diagnosis not present

## 2017-11-27 DIAGNOSIS — E1129 Type 2 diabetes mellitus with other diabetic kidney complication: Secondary | ICD-10-CM | POA: Diagnosis not present

## 2017-11-27 DIAGNOSIS — N2581 Secondary hyperparathyroidism of renal origin: Secondary | ICD-10-CM | POA: Diagnosis not present

## 2017-11-27 DIAGNOSIS — N186 End stage renal disease: Secondary | ICD-10-CM | POA: Diagnosis not present

## 2017-11-27 DIAGNOSIS — D631 Anemia in chronic kidney disease: Secondary | ICD-10-CM | POA: Diagnosis not present

## 2017-11-27 DIAGNOSIS — D509 Iron deficiency anemia, unspecified: Secondary | ICD-10-CM | POA: Diagnosis not present

## 2017-11-30 DIAGNOSIS — N2581 Secondary hyperparathyroidism of renal origin: Secondary | ICD-10-CM | POA: Diagnosis not present

## 2017-11-30 DIAGNOSIS — D509 Iron deficiency anemia, unspecified: Secondary | ICD-10-CM | POA: Diagnosis not present

## 2017-11-30 DIAGNOSIS — D689 Coagulation defect, unspecified: Secondary | ICD-10-CM | POA: Diagnosis not present

## 2017-11-30 DIAGNOSIS — E1129 Type 2 diabetes mellitus with other diabetic kidney complication: Secondary | ICD-10-CM | POA: Diagnosis not present

## 2017-11-30 DIAGNOSIS — Z7682 Awaiting organ transplant status: Secondary | ICD-10-CM | POA: Diagnosis not present

## 2017-11-30 DIAGNOSIS — N186 End stage renal disease: Secondary | ICD-10-CM | POA: Diagnosis not present

## 2017-12-02 ENCOUNTER — Telehealth: Payer: Self-pay | Admitting: Neurology

## 2017-12-02 DIAGNOSIS — N2581 Secondary hyperparathyroidism of renal origin: Secondary | ICD-10-CM | POA: Diagnosis not present

## 2017-12-02 DIAGNOSIS — D509 Iron deficiency anemia, unspecified: Secondary | ICD-10-CM | POA: Diagnosis not present

## 2017-12-02 DIAGNOSIS — D689 Coagulation defect, unspecified: Secondary | ICD-10-CM | POA: Diagnosis not present

## 2017-12-02 DIAGNOSIS — E1129 Type 2 diabetes mellitus with other diabetic kidney complication: Secondary | ICD-10-CM | POA: Diagnosis not present

## 2017-12-02 DIAGNOSIS — N186 End stage renal disease: Secondary | ICD-10-CM | POA: Diagnosis not present

## 2017-12-02 NOTE — Telephone Encounter (Signed)
*  STAT* If patient is at the pharmacy, call can be transferred to refill team.  1.     Which medications need to be refilled? (please list name of each medication and dose if know) Vimpat  2.     Which pharmacy/location (including street and city if local pharmacy) is medication to be sent to? [place]  3.     Do they need a 30 or 90 day supply?   Quanity needs to be approved, pharmacy faxed a request for that per pt

## 2017-12-02 NOTE — Telephone Encounter (Signed)
PA is still out for review.  Will know something in 24-48 hours.

## 2017-12-03 MED FILL — VIMPAT 150 MG TABLET: 150 | 30 days supply | Qty: 75 | Fill #1

## 2017-12-04 DIAGNOSIS — D509 Iron deficiency anemia, unspecified: Secondary | ICD-10-CM | POA: Diagnosis not present

## 2017-12-04 DIAGNOSIS — N2581 Secondary hyperparathyroidism of renal origin: Secondary | ICD-10-CM | POA: Diagnosis not present

## 2017-12-04 DIAGNOSIS — E1129 Type 2 diabetes mellitus with other diabetic kidney complication: Secondary | ICD-10-CM | POA: Diagnosis not present

## 2017-12-04 DIAGNOSIS — N186 End stage renal disease: Secondary | ICD-10-CM | POA: Diagnosis not present

## 2017-12-04 DIAGNOSIS — D689 Coagulation defect, unspecified: Secondary | ICD-10-CM | POA: Diagnosis not present

## 2017-12-04 NOTE — Telephone Encounter (Signed)
Received notice from [employer] that pt's VIMPAT has been approved for 2.5 tablets daily thru 12/01/2018

## 2017-12-06 MED FILL — lamoTRIgine 150 MG TABS: 150 | 90 days supply | Qty: 180 | Fill #1

## 2017-12-07 DIAGNOSIS — N2581 Secondary hyperparathyroidism of renal origin: Secondary | ICD-10-CM | POA: Diagnosis not present

## 2017-12-07 DIAGNOSIS — D509 Iron deficiency anemia, unspecified: Secondary | ICD-10-CM | POA: Diagnosis not present

## 2017-12-07 DIAGNOSIS — E1122 Type 2 diabetes mellitus with diabetic chronic kidney disease: Secondary | ICD-10-CM | POA: Diagnosis not present

## 2017-12-07 DIAGNOSIS — E1129 Type 2 diabetes mellitus with other diabetic kidney complication: Secondary | ICD-10-CM | POA: Diagnosis not present

## 2017-12-07 DIAGNOSIS — D689 Coagulation defect, unspecified: Secondary | ICD-10-CM | POA: Diagnosis not present

## 2017-12-07 DIAGNOSIS — D631 Anemia in chronic kidney disease: Secondary | ICD-10-CM | POA: Diagnosis not present

## 2017-12-07 DIAGNOSIS — Z992 Dependence on renal dialysis: Secondary | ICD-10-CM | POA: Diagnosis not present

## 2017-12-07 DIAGNOSIS — N186 End stage renal disease: Secondary | ICD-10-CM | POA: Diagnosis not present

## 2017-12-08 ENCOUNTER — Inpatient Hospital Stay: Payer: 59 | Attending: Oncology | Admitting: Oncology

## 2017-12-08 VITALS — BP 148/82 | HR 78 | Temp 98.9°F | Resp 18 | Ht 71.0 in | Wt 216.4 lb

## 2017-12-08 DIAGNOSIS — I129 Hypertensive chronic kidney disease with stage 1 through stage 4 chronic kidney disease, or unspecified chronic kidney disease: Secondary | ICD-10-CM | POA: Insufficient documentation

## 2017-12-08 DIAGNOSIS — Z86718 Personal history of other venous thrombosis and embolism: Secondary | ICD-10-CM | POA: Diagnosis not present

## 2017-12-08 DIAGNOSIS — E1122 Type 2 diabetes mellitus with diabetic chronic kidney disease: Secondary | ICD-10-CM | POA: Insufficient documentation

## 2017-12-08 DIAGNOSIS — D631 Anemia in chronic kidney disease: Secondary | ICD-10-CM | POA: Diagnosis not present

## 2017-12-08 DIAGNOSIS — Z992 Dependence on renal dialysis: Secondary | ICD-10-CM | POA: Insufficient documentation

## 2017-12-08 DIAGNOSIS — N189 Chronic kidney disease, unspecified: Secondary | ICD-10-CM | POA: Diagnosis not present

## 2017-12-08 DIAGNOSIS — D472 Monoclonal gammopathy: Secondary | ICD-10-CM

## 2017-12-08 DIAGNOSIS — Z8673 Personal history of transient ischemic attack (TIA), and cerebral infarction without residual deficits: Secondary | ICD-10-CM | POA: Diagnosis not present

## 2017-12-08 DIAGNOSIS — C9000 Multiple myeloma not having achieved remission: Secondary | ICD-10-CM | POA: Insufficient documentation

## 2017-12-08 MED FILL — clonazePAM 0.5 MG TABS: 0.5 | 15 days supply | Qty: 15 | Fill #1

## 2017-12-08 NOTE — Progress Notes (Signed)
Hematology and Oncology Follow Up Visit  [place] [id number] 10/20/1965 52 y.o. 12/08/2017 1:17 PM    Principle Diagnosis: 52 year old   1.  IgG kappa plasma cell disorder diagnosed in 2012.  He was initially diagnosed with MGUS and currently has smoldering myeloma.  Smoldering myeloma: Bone marrow biopsy in March 2018 showed close to 20% plasma cell infiltration.  2.  Anemia of renal disease.  3. Chronic renal failure hemodialysis-dependent related to long-standing hypertension and diabetes.  4. He is status post CVA and right-sided weakness and speech difficulties in February 2017.  Interim History:  Isaiah [name] is here for a follow-up visit with his wife.  He reports no major changes since last visit and remains dialysis dependent.  He tolerates the sessions reasonably well without any recent complications.  He did have a seizure post dialysis at one time.  He denies any bone pain or pathological fractures.  He continues to perform limited activities and ambulating without any walker.  He is permanently disabled because of his medical conditions.  He denies any recurrent infections or pneumonia.  He denies any peripheral neuropathy.    He does not report any headaches, blurry vision, syncope. He does not report any fevers, chills, sweats or weight change. He does not report any  palpitation, orthopnea or leg edema. Does not report any cough or hemoptysis or hematemesis. He does not report any nausea, vomiting.  He denies any abdominal pain or early satiety.  He does not report any frequency, urgency or hesitancy.  He does not report any skin rashes or lesions.  He denies any lymphadenopathy or petechiae.  Rest of his review of systems is negative.   Medications: I have reviewed the patient's current medications.  Current Outpatient Medications:  .  albuterol (PROAIR HFA) 108 (90 Base) MCG/ACT inhaler, Inhale two puffs every 4-6 hours if needed for cough or wheeze, Disp: 1 Inhaler, Rfl:  1 .  aspirin EC 81 MG tablet, Take 1 tablet (81 mg total) by mouth daily., Disp: 100 tablet, Rfl: 0 .  calcium acetate (PHOSLO) 667 MG capsule, Take 2,001 mg by mouth 3 (three) times daily with meals. , Disp: , Rfl:  .  Cetirizine HCl 10 MG CAPS, Take 1 capsule by mouth daily., Disp: , Rfl:  .  cinacalcet (SENSIPAR) 60 MG tablet, Take 60 mg by mouth every other day. , Disp: , Rfl:  .  clonazePAM (KLONOPIN) 0.5 MG tablet, Take 1 tablet prior to each dialysis session, Disp: 15 tablet, Rfl: 5 .  fluticasone furoate-vilanterol (BREO ELLIPTA) 100-25 MCG/INH AEPB, Inhale one puff once daily to prevent cough or wheeze, Disp: 60 each, Rfl: 5 .  insulin glargine (LANTUS) 100 unit/mL SOPN, Inject 8 Units into the skin at bedtime. , Disp: , Rfl:  .  Lacosamide (VIMPAT) 150 MG TABS, Take 1 tablet twice a day. Take an additional 1 tablet after dialysis session three times a week., Disp: 75 tablet, Rfl: 11 .  lamoTRIgine (LAMICTAL) 150 MG tablet, TAKE 1 TABLET BY MOUTH TWICE A DAY (Patient taking differently: TAKE 150 mg TABLET BY MOUTH TWICE A DAY), Disp: 180 tablet, Rfl: 1 .  metoprolol tartrate (LOPRESSOR) 25 MG tablet, Take 0.5 tablets (12.5 mg total) by mouth 2 (two) times daily., Disp: 60 tablet, Rfl: 0 .  midodrine (PROAMATINE) 10 MG tablet, Take 10 mg by mouth 2 (two) times daily with a meal. , Disp: , Rfl:  .  multivitamin (RENA-VIT) TABS tablet, Take 1 tablet by mouth  at bedtime., Disp: 30 tablet, Rfl: 1 .  pantoprazole (PROTONIX) 40 MG tablet, Take 1 tablet (40 mg total) by mouth 2 (two) times daily. (Patient taking differently: Take 40 mg by mouth every morning. ), Disp: 60 tablet, Rfl: 1  Allergies:  Allergies  Allergen Reactions  . Codeine Rash    Unknown reaction (patient says it was more serious than just a rash, but he can't remember what happened)    Past Medical History, Surgical history, Social history, and Family History were reviewed and updated.  Physical Exam: Blood pressure (!)  148/82, pulse 78, temperature 98.9 F (37.2 C), temperature source Oral, resp. rate 18, height 5' 11"  (1.803 m), weight 216 lb 6.4 oz (98.2 kg), SpO2 99 %. ECOG: 1 General appearance: Well-appearing gentleman without distress. Head: Atraumatic without abnormalities. Eyes: No scleral icterus. Oropharynx: No thrush or ulcers. Lymph nodes: Cervical, supraclavicular, and axillary nodes normal. Heart: Regular rate and rhythm without any murmurs or gallops. Lung: Clear to auscultation without any wheezes or dullness to percussion. Abdomin: Soft, nontender without any rebound or guarding. Musculoskeletal: No joint deformity or effusion.  No clubbing or cyanosis. Neurological: No deficits noted.  Lab Results: Lab Results  Component Value Date   WBC 12.1 (H) 11/23/2017   HGB 10.0 (L) 11/23/2017   HCT 27.6 (L) 11/23/2017   MCV 82.4 11/23/2017   PLT 244 11/23/2017     Chemistry      Component Value Date/Time   NA 135 11/23/2017 1647   NA 138 07/03/2017 0813   K 3.4 (L) 11/23/2017 1647   K 4.2 07/03/2017 0813   CL 95 (L) 11/23/2017 1647   CO2 27 11/23/2017 1647   CO2 27 07/03/2017 0813   BUN 20 11/23/2017 1647   BUN 46.4 (H) 07/03/2017 0813   CREATININE 6.50 (H) 11/23/2017 1647   CREATININE 10.9 (HH) 07/03/2017 0813      Component Value Date/Time   CALCIUM 8.3 (L) 11/23/2017 1647   CALCIUM 9.3 07/03/2017 0813   ALKPHOS 100 10/29/2017 0840   ALKPHOS 77 07/03/2017 0813   AST 11 10/29/2017 0840   AST 16 07/03/2017 0813   ALT 8 10/29/2017 0840   ALT 14 07/03/2017 0813   BILITOT 0.9 10/29/2017 0840   BILITOT 0.93 07/03/2017 0813      Results for Isaiah [name], Isaiah [name] (MRN [id number]) as of 12/08/2017 13:09  Ref. Range 07/08/2016 09:21 10/14/2016 09:43 07/03/2017 08:13 10/29/2017 08:40  M Protein SerPl Elph-Mcnc Latest Ref Range: Not Observed g/dL 2.5 (H) 3.4 (H) 3.4 (H) 3.3 (H)          Impression and Plan:  52 year old man with  1.  IgG kappa smoldering myeloma.  He was  initially diagnosed in 2012 with MGUS.  He has been on active surveillance since that time without any symptomatic myeloma that required treatment.  Protein studies obtained on October 29, 2017 were personally reviewed and not changed compared to previous studies.  His M spike remains around 3 g/dL which is similar to what it was in February 2018.  He has IgG level currently at 5000 mg.    The natural course of this disease was reviewed again with the patient and his wife as well as indication for treatment.  Treatment for active multiple myeloma will be likely needed in the future.  He would be a reasonable candidate for Velcade and dexamethasone if he develop symptomatic myeloma.  Risks and benefits of this therapy starting now versus at a later date were discussed  and he opted to defer that option for the time being unless he is symptomatic.  Skeletal survey in February 2019 showed no bone lesions and he remains asymptomatic.  We have opted to continue with active surveillance at this time and repeat protein studies in 6 months.  Anti-myeloma treatment will be started if he develops any symptoms.  At that time we will restage with a bone marrow biopsy.    2. Anemia: Related to chronic renal insufficiency, hemoglobinopathy and possible plasma cell disorder.  3. Renal insufficiency: Remains hemodialysis dependent.  His renal failure is not related to plasma cell disorder.  4. Recurrent thrombosis: No recent thrombosis or bleeding noted.  He is off anticoagulation.  5. Follow-up: Will be in 6 months to follow his progress.  15  minutes was spent with the patient face-to-face today.  More than 50% of time was dedicated to patient counseling, education and coordination of his care.     Zola Button, MD 4/2/20191:17 PM

## 2017-12-09 ENCOUNTER — Telehealth: Payer: Self-pay

## 2017-12-09 DIAGNOSIS — E1129 Type 2 diabetes mellitus with other diabetic kidney complication: Secondary | ICD-10-CM | POA: Diagnosis not present

## 2017-12-09 DIAGNOSIS — N2581 Secondary hyperparathyroidism of renal origin: Secondary | ICD-10-CM | POA: Diagnosis not present

## 2017-12-09 DIAGNOSIS — D689 Coagulation defect, unspecified: Secondary | ICD-10-CM | POA: Diagnosis not present

## 2017-12-09 DIAGNOSIS — D509 Iron deficiency anemia, unspecified: Secondary | ICD-10-CM | POA: Diagnosis not present

## 2017-12-09 DIAGNOSIS — N186 End stage renal disease: Secondary | ICD-10-CM | POA: Diagnosis not present

## 2017-12-09 DIAGNOSIS — D631 Anemia in chronic kidney disease: Secondary | ICD-10-CM | POA: Diagnosis not present

## 2017-12-09 NOTE — Telephone Encounter (Signed)
Spoke with patient wife concerning upcoming appointment. Will also being mailing calender and letter per 4/2 los

## 2017-12-11 DIAGNOSIS — D631 Anemia in chronic kidney disease: Secondary | ICD-10-CM | POA: Diagnosis not present

## 2017-12-11 DIAGNOSIS — N186 End stage renal disease: Secondary | ICD-10-CM | POA: Diagnosis not present

## 2017-12-11 DIAGNOSIS — E1129 Type 2 diabetes mellitus with other diabetic kidney complication: Secondary | ICD-10-CM | POA: Diagnosis not present

## 2017-12-11 DIAGNOSIS — N2581 Secondary hyperparathyroidism of renal origin: Secondary | ICD-10-CM | POA: Diagnosis not present

## 2017-12-11 DIAGNOSIS — D509 Iron deficiency anemia, unspecified: Secondary | ICD-10-CM | POA: Diagnosis not present

## 2017-12-11 DIAGNOSIS — D689 Coagulation defect, unspecified: Secondary | ICD-10-CM | POA: Diagnosis not present

## 2017-12-14 DIAGNOSIS — D689 Coagulation defect, unspecified: Secondary | ICD-10-CM | POA: Diagnosis not present

## 2017-12-14 DIAGNOSIS — E1129 Type 2 diabetes mellitus with other diabetic kidney complication: Secondary | ICD-10-CM | POA: Diagnosis not present

## 2017-12-14 DIAGNOSIS — N2581 Secondary hyperparathyroidism of renal origin: Secondary | ICD-10-CM | POA: Diagnosis not present

## 2017-12-14 DIAGNOSIS — D509 Iron deficiency anemia, unspecified: Secondary | ICD-10-CM | POA: Diagnosis not present

## 2017-12-14 DIAGNOSIS — N186 End stage renal disease: Secondary | ICD-10-CM | POA: Diagnosis not present

## 2017-12-16 DIAGNOSIS — D689 Coagulation defect, unspecified: Secondary | ICD-10-CM | POA: Diagnosis not present

## 2017-12-16 DIAGNOSIS — N186 End stage renal disease: Secondary | ICD-10-CM | POA: Diagnosis not present

## 2017-12-16 DIAGNOSIS — D509 Iron deficiency anemia, unspecified: Secondary | ICD-10-CM | POA: Diagnosis not present

## 2017-12-16 DIAGNOSIS — E1129 Type 2 diabetes mellitus with other diabetic kidney complication: Secondary | ICD-10-CM | POA: Diagnosis not present

## 2017-12-16 DIAGNOSIS — N2581 Secondary hyperparathyroidism of renal origin: Secondary | ICD-10-CM | POA: Diagnosis not present

## 2017-12-18 DIAGNOSIS — E1129 Type 2 diabetes mellitus with other diabetic kidney complication: Secondary | ICD-10-CM | POA: Diagnosis not present

## 2017-12-18 DIAGNOSIS — D509 Iron deficiency anemia, unspecified: Secondary | ICD-10-CM | POA: Diagnosis not present

## 2017-12-18 DIAGNOSIS — N186 End stage renal disease: Secondary | ICD-10-CM | POA: Diagnosis not present

## 2017-12-18 DIAGNOSIS — N2581 Secondary hyperparathyroidism of renal origin: Secondary | ICD-10-CM | POA: Diagnosis not present

## 2017-12-18 DIAGNOSIS — D689 Coagulation defect, unspecified: Secondary | ICD-10-CM | POA: Diagnosis not present

## 2017-12-21 DIAGNOSIS — D631 Anemia in chronic kidney disease: Secondary | ICD-10-CM | POA: Diagnosis not present

## 2017-12-21 DIAGNOSIS — N186 End stage renal disease: Secondary | ICD-10-CM | POA: Diagnosis not present

## 2017-12-21 DIAGNOSIS — E1129 Type 2 diabetes mellitus with other diabetic kidney complication: Secondary | ICD-10-CM | POA: Diagnosis not present

## 2017-12-21 DIAGNOSIS — D689 Coagulation defect, unspecified: Secondary | ICD-10-CM | POA: Diagnosis not present

## 2017-12-21 DIAGNOSIS — D509 Iron deficiency anemia, unspecified: Secondary | ICD-10-CM | POA: Diagnosis not present

## 2017-12-21 DIAGNOSIS — N2581 Secondary hyperparathyroidism of renal origin: Secondary | ICD-10-CM | POA: Diagnosis not present

## 2017-12-23 DIAGNOSIS — D509 Iron deficiency anemia, unspecified: Secondary | ICD-10-CM | POA: Diagnosis not present

## 2017-12-23 DIAGNOSIS — N2581 Secondary hyperparathyroidism of renal origin: Secondary | ICD-10-CM | POA: Diagnosis not present

## 2017-12-23 DIAGNOSIS — N186 End stage renal disease: Secondary | ICD-10-CM | POA: Diagnosis not present

## 2017-12-23 DIAGNOSIS — E1129 Type 2 diabetes mellitus with other diabetic kidney complication: Secondary | ICD-10-CM | POA: Diagnosis not present

## 2017-12-23 DIAGNOSIS — D631 Anemia in chronic kidney disease: Secondary | ICD-10-CM | POA: Diagnosis not present

## 2017-12-23 DIAGNOSIS — D689 Coagulation defect, unspecified: Secondary | ICD-10-CM | POA: Diagnosis not present

## 2017-12-25 DIAGNOSIS — D631 Anemia in chronic kidney disease: Secondary | ICD-10-CM | POA: Diagnosis not present

## 2017-12-25 DIAGNOSIS — N2581 Secondary hyperparathyroidism of renal origin: Secondary | ICD-10-CM | POA: Diagnosis not present

## 2017-12-25 DIAGNOSIS — D509 Iron deficiency anemia, unspecified: Secondary | ICD-10-CM | POA: Diagnosis not present

## 2017-12-25 DIAGNOSIS — D689 Coagulation defect, unspecified: Secondary | ICD-10-CM | POA: Diagnosis not present

## 2017-12-25 DIAGNOSIS — N186 End stage renal disease: Secondary | ICD-10-CM | POA: Diagnosis not present

## 2017-12-25 DIAGNOSIS — E1129 Type 2 diabetes mellitus with other diabetic kidney complication: Secondary | ICD-10-CM | POA: Diagnosis not present

## 2017-12-27 MED FILL — clonazePAM 0.5 MG TABS: 0.5 | 15 days supply | Qty: 15 | Fill #2

## 2017-12-27 MED FILL — UNIFINE PENTIPS 8MM 31G: 31G X 8 MM | 90 days supply | Qty: 100 | Fill #3 | Status: TO

## 2017-12-28 DIAGNOSIS — D689 Coagulation defect, unspecified: Secondary | ICD-10-CM | POA: Diagnosis not present

## 2017-12-28 DIAGNOSIS — E1129 Type 2 diabetes mellitus with other diabetic kidney complication: Secondary | ICD-10-CM | POA: Diagnosis not present

## 2017-12-28 DIAGNOSIS — D509 Iron deficiency anemia, unspecified: Secondary | ICD-10-CM | POA: Diagnosis not present

## 2017-12-28 DIAGNOSIS — N186 End stage renal disease: Secondary | ICD-10-CM | POA: Diagnosis not present

## 2017-12-28 DIAGNOSIS — N2581 Secondary hyperparathyroidism of renal origin: Secondary | ICD-10-CM | POA: Diagnosis not present

## 2017-12-29 ENCOUNTER — Telehealth: Payer: Self-pay | Admitting: Neurology

## 2017-12-29 NOTE — Telephone Encounter (Signed)
Patient's [name]) called needing to speak with you regarding FMLA paperwork that will be sent to our office.  Please Call. Thanks

## 2017-12-29 NOTE — Telephone Encounter (Signed)
Spoke with pt's wife, Isaiah [name], who states that her FMLA is due to expire soon.  She wanted me to be aware that FMLA paperwork for the next 12 months will be coming to my attention.  I advised that we have a 10 day turn around policy for these types of paperwork.  Isaiah [name] understood.  She states that she would also like a letter written explaining pt's diagnosis, his recovery time, and that she may need to leave work to take care of him.  Let Isaiah [name] know that Dr. Delice Lesch is out of the office today and that letter can be written when she returns tomorrow.

## 2017-12-30 DIAGNOSIS — E1129 Type 2 diabetes mellitus with other diabetic kidney complication: Secondary | ICD-10-CM | POA: Diagnosis not present

## 2017-12-30 DIAGNOSIS — D689 Coagulation defect, unspecified: Secondary | ICD-10-CM | POA: Diagnosis not present

## 2017-12-30 DIAGNOSIS — N186 End stage renal disease: Secondary | ICD-10-CM | POA: Diagnosis not present

## 2017-12-30 DIAGNOSIS — D509 Iron deficiency anemia, unspecified: Secondary | ICD-10-CM | POA: Diagnosis not present

## 2017-12-30 DIAGNOSIS — N2581 Secondary hyperparathyroidism of renal origin: Secondary | ICD-10-CM | POA: Diagnosis not present

## 2017-12-30 MED FILL — LANTUS SOLOSTAR 100 UNITS/M: 100 | 75 days supply | Qty: 9 | Fill #1

## 2017-12-30 MED FILL — VIMPAT 150 MG TABLET: 150 | 30 days supply | Qty: 75 | Fill #2

## 2017-12-31 ENCOUNTER — Encounter: Payer: Self-pay | Admitting: Neurology

## 2017-12-31 ENCOUNTER — Telehealth: Payer: Self-pay | Admitting: Neurology

## 2017-12-31 NOTE — Telephone Encounter (Signed)
[name] called regarding pt and said FMLA paperwork was supposed to be faxed to Dr Delice Lesch and was wondering if that was received or not

## 2017-12-31 NOTE — Telephone Encounter (Signed)
Spoke with Isaiah [name] letting her know that paperwork and letter are up front for her to pick up at her convenience.  Isaiah [name] will pay fee when she picks up.

## 2018-01-01 DIAGNOSIS — E1129 Type 2 diabetes mellitus with other diabetic kidney complication: Secondary | ICD-10-CM | POA: Diagnosis not present

## 2018-01-01 DIAGNOSIS — N2581 Secondary hyperparathyroidism of renal origin: Secondary | ICD-10-CM | POA: Diagnosis not present

## 2018-01-01 DIAGNOSIS — Z0279 Encounter for issue of other medical certificate: Secondary | ICD-10-CM

## 2018-01-01 DIAGNOSIS — D689 Coagulation defect, unspecified: Secondary | ICD-10-CM | POA: Diagnosis not present

## 2018-01-01 DIAGNOSIS — D509 Iron deficiency anemia, unspecified: Secondary | ICD-10-CM | POA: Diagnosis not present

## 2018-01-01 DIAGNOSIS — N186 End stage renal disease: Secondary | ICD-10-CM | POA: Diagnosis not present

## 2018-01-04 DIAGNOSIS — D689 Coagulation defect, unspecified: Secondary | ICD-10-CM | POA: Diagnosis not present

## 2018-01-04 DIAGNOSIS — N2581 Secondary hyperparathyroidism of renal origin: Secondary | ICD-10-CM | POA: Diagnosis not present

## 2018-01-04 DIAGNOSIS — E1129 Type 2 diabetes mellitus with other diabetic kidney complication: Secondary | ICD-10-CM | POA: Diagnosis not present

## 2018-01-04 DIAGNOSIS — N186 End stage renal disease: Secondary | ICD-10-CM | POA: Diagnosis not present

## 2018-01-06 DIAGNOSIS — N2581 Secondary hyperparathyroidism of renal origin: Secondary | ICD-10-CM | POA: Diagnosis not present

## 2018-01-06 DIAGNOSIS — D689 Coagulation defect, unspecified: Secondary | ICD-10-CM | POA: Diagnosis not present

## 2018-01-06 DIAGNOSIS — N186 End stage renal disease: Secondary | ICD-10-CM | POA: Diagnosis not present

## 2018-01-06 DIAGNOSIS — E1129 Type 2 diabetes mellitus with other diabetic kidney complication: Secondary | ICD-10-CM | POA: Diagnosis not present

## 2018-01-06 DIAGNOSIS — D509 Iron deficiency anemia, unspecified: Secondary | ICD-10-CM | POA: Diagnosis not present

## 2018-01-08 DIAGNOSIS — D509 Iron deficiency anemia, unspecified: Secondary | ICD-10-CM | POA: Diagnosis not present

## 2018-01-08 DIAGNOSIS — D689 Coagulation defect, unspecified: Secondary | ICD-10-CM | POA: Diagnosis not present

## 2018-01-08 DIAGNOSIS — N186 End stage renal disease: Secondary | ICD-10-CM | POA: Diagnosis not present

## 2018-01-08 DIAGNOSIS — E1129 Type 2 diabetes mellitus with other diabetic kidney complication: Secondary | ICD-10-CM | POA: Diagnosis not present

## 2018-01-08 DIAGNOSIS — N2581 Secondary hyperparathyroidism of renal origin: Secondary | ICD-10-CM | POA: Diagnosis not present

## 2018-01-11 ENCOUNTER — Telehealth: Payer: Self-pay | Admitting: *Deleted

## 2018-01-11 DIAGNOSIS — D509 Iron deficiency anemia, unspecified: Secondary | ICD-10-CM | POA: Diagnosis not present

## 2018-01-11 DIAGNOSIS — N186 End stage renal disease: Secondary | ICD-10-CM | POA: Diagnosis not present

## 2018-01-11 DIAGNOSIS — N2581 Secondary hyperparathyroidism of renal origin: Secondary | ICD-10-CM | POA: Diagnosis not present

## 2018-01-11 DIAGNOSIS — E1129 Type 2 diabetes mellitus with other diabetic kidney complication: Secondary | ICD-10-CM | POA: Diagnosis not present

## 2018-01-11 DIAGNOSIS — D689 Coagulation defect, unspecified: Secondary | ICD-10-CM | POA: Diagnosis not present

## 2018-01-11 MED FILL — METOPROLOL TARTRATE 25 MG T: 25 | 90 days supply | Qty: 90 | Fill #1 | Status: TO

## 2018-01-11 NOTE — Telephone Encounter (Signed)
Faxed ROI to [name] MD; release [phone]

## 2018-01-12 ENCOUNTER — Ambulatory Visit: Payer: 59 | Admitting: Neurology

## 2018-01-12 ENCOUNTER — Ambulatory Visit (INDEPENDENT_AMBULATORY_CARE_PROVIDER_SITE_OTHER): Payer: 59 | Admitting: Ophthalmology

## 2018-01-12 DIAGNOSIS — H43812 Vitreous degeneration, left eye: Secondary | ICD-10-CM

## 2018-01-12 DIAGNOSIS — H3523 Other non-diabetic proliferative retinopathy, bilateral: Secondary | ICD-10-CM

## 2018-01-12 DIAGNOSIS — I10 Essential (primary) hypertension: Secondary | ICD-10-CM | POA: Diagnosis not present

## 2018-01-12 DIAGNOSIS — H35033 Hypertensive retinopathy, bilateral: Secondary | ICD-10-CM

## 2018-01-13 DIAGNOSIS — N186 End stage renal disease: Secondary | ICD-10-CM | POA: Diagnosis not present

## 2018-01-13 DIAGNOSIS — D509 Iron deficiency anemia, unspecified: Secondary | ICD-10-CM | POA: Diagnosis not present

## 2018-01-13 DIAGNOSIS — N2581 Secondary hyperparathyroidism of renal origin: Secondary | ICD-10-CM | POA: Diagnosis not present

## 2018-01-13 DIAGNOSIS — E1129 Type 2 diabetes mellitus with other diabetic kidney complication: Secondary | ICD-10-CM | POA: Diagnosis not present

## 2018-01-13 DIAGNOSIS — D689 Coagulation defect, unspecified: Secondary | ICD-10-CM | POA: Diagnosis not present

## 2018-01-13 MED FILL — FREESTYLE LITE TEST STRIP: 90 days supply | Qty: 300 | Fill #1

## 2018-01-15 DIAGNOSIS — D509 Iron deficiency anemia, unspecified: Secondary | ICD-10-CM | POA: Diagnosis not present

## 2018-01-15 DIAGNOSIS — N186 End stage renal disease: Secondary | ICD-10-CM | POA: Diagnosis not present

## 2018-01-15 DIAGNOSIS — N2581 Secondary hyperparathyroidism of renal origin: Secondary | ICD-10-CM | POA: Diagnosis not present

## 2018-01-15 DIAGNOSIS — D689 Coagulation defect, unspecified: Secondary | ICD-10-CM | POA: Diagnosis not present

## 2018-01-15 DIAGNOSIS — E1129 Type 2 diabetes mellitus with other diabetic kidney complication: Secondary | ICD-10-CM | POA: Diagnosis not present

## 2018-01-18 DIAGNOSIS — D631 Anemia in chronic kidney disease: Secondary | ICD-10-CM | POA: Diagnosis not present

## 2018-01-18 DIAGNOSIS — E1129 Type 2 diabetes mellitus with other diabetic kidney complication: Secondary | ICD-10-CM | POA: Diagnosis not present

## 2018-01-18 DIAGNOSIS — N186 End stage renal disease: Secondary | ICD-10-CM | POA: Diagnosis not present

## 2018-01-18 DIAGNOSIS — D689 Coagulation defect, unspecified: Secondary | ICD-10-CM | POA: Diagnosis not present

## 2018-01-18 DIAGNOSIS — D509 Iron deficiency anemia, unspecified: Secondary | ICD-10-CM | POA: Diagnosis not present

## 2018-01-18 DIAGNOSIS — N2581 Secondary hyperparathyroidism of renal origin: Secondary | ICD-10-CM | POA: Diagnosis not present

## 2018-01-19 DIAGNOSIS — H52223 Regular astigmatism, bilateral: Secondary | ICD-10-CM | POA: Diagnosis not present

## 2018-01-19 DIAGNOSIS — D573 Sickle-cell trait: Secondary | ICD-10-CM | POA: Diagnosis not present

## 2018-01-19 DIAGNOSIS — H3523 Other non-diabetic proliferative retinopathy, bilateral: Secondary | ICD-10-CM | POA: Diagnosis not present

## 2018-01-19 DIAGNOSIS — H5201 Hypermetropia, right eye: Secondary | ICD-10-CM | POA: Diagnosis not present

## 2018-01-19 DIAGNOSIS — E119 Type 2 diabetes mellitus without complications: Secondary | ICD-10-CM | POA: Diagnosis not present

## 2018-01-19 DIAGNOSIS — H3582 Retinal ischemia: Secondary | ICD-10-CM | POA: Diagnosis not present

## 2018-01-19 DIAGNOSIS — H5212 Myopia, left eye: Secondary | ICD-10-CM | POA: Diagnosis not present

## 2018-01-19 LAB — HM DIABETES EYE EXAM

## 2018-01-20 DIAGNOSIS — D631 Anemia in chronic kidney disease: Secondary | ICD-10-CM | POA: Diagnosis not present

## 2018-01-20 DIAGNOSIS — D689 Coagulation defect, unspecified: Secondary | ICD-10-CM | POA: Diagnosis not present

## 2018-01-20 DIAGNOSIS — E1129 Type 2 diabetes mellitus with other diabetic kidney complication: Secondary | ICD-10-CM | POA: Diagnosis not present

## 2018-01-20 DIAGNOSIS — N2581 Secondary hyperparathyroidism of renal origin: Secondary | ICD-10-CM | POA: Diagnosis not present

## 2018-01-20 DIAGNOSIS — D509 Iron deficiency anemia, unspecified: Secondary | ICD-10-CM | POA: Diagnosis not present

## 2018-01-20 DIAGNOSIS — N186 End stage renal disease: Secondary | ICD-10-CM | POA: Diagnosis not present

## 2018-01-22 DIAGNOSIS — N2581 Secondary hyperparathyroidism of renal origin: Secondary | ICD-10-CM | POA: Diagnosis not present

## 2018-01-22 DIAGNOSIS — N186 End stage renal disease: Secondary | ICD-10-CM | POA: Diagnosis not present

## 2018-01-22 DIAGNOSIS — D631 Anemia in chronic kidney disease: Secondary | ICD-10-CM | POA: Diagnosis not present

## 2018-01-22 DIAGNOSIS — D689 Coagulation defect, unspecified: Secondary | ICD-10-CM | POA: Diagnosis not present

## 2018-01-22 DIAGNOSIS — E1129 Type 2 diabetes mellitus with other diabetic kidney complication: Secondary | ICD-10-CM | POA: Diagnosis not present

## 2018-01-22 DIAGNOSIS — D509 Iron deficiency anemia, unspecified: Secondary | ICD-10-CM | POA: Diagnosis not present

## 2018-01-22 MED FILL — SM ALCOHOL 70% PREP PADS: 70 | 33 days supply | Qty: 100 | Fill #0

## 2018-01-24 MED FILL — PANTOPRAZOLE SOD DR 40 MG T: 40 | 90 days supply | Qty: 90 | Fill #1 | Status: TO

## 2018-01-24 MED FILL — clonazePAM 0.5 MG TABS: 0.5 | 15 days supply | Qty: 15 | Fill #3 | Status: TO

## 2018-01-24 MED FILL — BREO ELLIPTA 200-25 MCG INH: 200-25 | 30 days supply | Qty: 60 | Fill #1

## 2018-01-25 DIAGNOSIS — N2581 Secondary hyperparathyroidism of renal origin: Secondary | ICD-10-CM | POA: Diagnosis not present

## 2018-01-25 DIAGNOSIS — D689 Coagulation defect, unspecified: Secondary | ICD-10-CM | POA: Diagnosis not present

## 2018-01-25 DIAGNOSIS — E1129 Type 2 diabetes mellitus with other diabetic kidney complication: Secondary | ICD-10-CM | POA: Diagnosis not present

## 2018-01-25 DIAGNOSIS — N186 End stage renal disease: Secondary | ICD-10-CM | POA: Diagnosis not present

## 2018-01-25 DIAGNOSIS — D509 Iron deficiency anemia, unspecified: Secondary | ICD-10-CM | POA: Diagnosis not present

## 2018-01-25 MED FILL — CALCIUM ACETATE 667 MG CAP: 667 | 83 days supply | Qty: 1080 | Fill #1

## 2018-01-25 MED FILL — SENSIPAR 60 MG TABLET: 60 | 30 days supply | Qty: 30 | Fill #4

## 2018-01-27 DIAGNOSIS — N186 End stage renal disease: Secondary | ICD-10-CM | POA: Diagnosis not present

## 2018-01-27 DIAGNOSIS — D689 Coagulation defect, unspecified: Secondary | ICD-10-CM | POA: Diagnosis not present

## 2018-01-27 DIAGNOSIS — D509 Iron deficiency anemia, unspecified: Secondary | ICD-10-CM | POA: Diagnosis not present

## 2018-01-27 DIAGNOSIS — E1129 Type 2 diabetes mellitus with other diabetic kidney complication: Secondary | ICD-10-CM | POA: Diagnosis not present

## 2018-01-27 DIAGNOSIS — N2581 Secondary hyperparathyroidism of renal origin: Secondary | ICD-10-CM | POA: Diagnosis not present

## 2018-01-27 MED FILL — VIMPAT 150 MG TABLET: 150 | 30 days supply | Qty: 75 | Fill #3

## 2018-01-29 DIAGNOSIS — D689 Coagulation defect, unspecified: Secondary | ICD-10-CM | POA: Diagnosis not present

## 2018-01-29 DIAGNOSIS — N186 End stage renal disease: Secondary | ICD-10-CM | POA: Diagnosis not present

## 2018-01-29 DIAGNOSIS — N2581 Secondary hyperparathyroidism of renal origin: Secondary | ICD-10-CM | POA: Diagnosis not present

## 2018-01-29 DIAGNOSIS — E1129 Type 2 diabetes mellitus with other diabetic kidney complication: Secondary | ICD-10-CM | POA: Diagnosis not present

## 2018-01-29 DIAGNOSIS — D509 Iron deficiency anemia, unspecified: Secondary | ICD-10-CM | POA: Diagnosis not present

## 2018-02-01 DIAGNOSIS — D631 Anemia in chronic kidney disease: Secondary | ICD-10-CM | POA: Diagnosis not present

## 2018-02-01 DIAGNOSIS — D689 Coagulation defect, unspecified: Secondary | ICD-10-CM | POA: Diagnosis not present

## 2018-02-01 DIAGNOSIS — D509 Iron deficiency anemia, unspecified: Secondary | ICD-10-CM | POA: Diagnosis not present

## 2018-02-01 DIAGNOSIS — N186 End stage renal disease: Secondary | ICD-10-CM | POA: Diagnosis not present

## 2018-02-01 DIAGNOSIS — N2581 Secondary hyperparathyroidism of renal origin: Secondary | ICD-10-CM | POA: Diagnosis not present

## 2018-02-01 DIAGNOSIS — E1129 Type 2 diabetes mellitus with other diabetic kidney complication: Secondary | ICD-10-CM | POA: Diagnosis not present

## 2018-02-03 DIAGNOSIS — N186 End stage renal disease: Secondary | ICD-10-CM | POA: Diagnosis not present

## 2018-02-03 DIAGNOSIS — D631 Anemia in chronic kidney disease: Secondary | ICD-10-CM | POA: Diagnosis not present

## 2018-02-03 DIAGNOSIS — E1129 Type 2 diabetes mellitus with other diabetic kidney complication: Secondary | ICD-10-CM | POA: Diagnosis not present

## 2018-02-03 DIAGNOSIS — D689 Coagulation defect, unspecified: Secondary | ICD-10-CM | POA: Diagnosis not present

## 2018-02-03 DIAGNOSIS — N2581 Secondary hyperparathyroidism of renal origin: Secondary | ICD-10-CM | POA: Diagnosis not present

## 2018-02-03 DIAGNOSIS — D509 Iron deficiency anemia, unspecified: Secondary | ICD-10-CM | POA: Diagnosis not present

## 2018-02-05 DIAGNOSIS — Z992 Dependence on renal dialysis: Secondary | ICD-10-CM | POA: Diagnosis not present

## 2018-02-05 DIAGNOSIS — D689 Coagulation defect, unspecified: Secondary | ICD-10-CM | POA: Diagnosis not present

## 2018-02-05 DIAGNOSIS — E1122 Type 2 diabetes mellitus with diabetic chronic kidney disease: Secondary | ICD-10-CM | POA: Diagnosis not present

## 2018-02-05 DIAGNOSIS — E1129 Type 2 diabetes mellitus with other diabetic kidney complication: Secondary | ICD-10-CM | POA: Diagnosis not present

## 2018-02-05 DIAGNOSIS — D631 Anemia in chronic kidney disease: Secondary | ICD-10-CM | POA: Diagnosis not present

## 2018-02-05 DIAGNOSIS — D509 Iron deficiency anemia, unspecified: Secondary | ICD-10-CM | POA: Diagnosis not present

## 2018-02-05 DIAGNOSIS — N186 End stage renal disease: Secondary | ICD-10-CM | POA: Diagnosis not present

## 2018-02-05 DIAGNOSIS — N2581 Secondary hyperparathyroidism of renal origin: Secondary | ICD-10-CM | POA: Diagnosis not present

## 2018-02-08 DIAGNOSIS — N186 End stage renal disease: Secondary | ICD-10-CM | POA: Diagnosis not present

## 2018-02-08 DIAGNOSIS — D689 Coagulation defect, unspecified: Secondary | ICD-10-CM | POA: Diagnosis not present

## 2018-02-08 DIAGNOSIS — N2581 Secondary hyperparathyroidism of renal origin: Secondary | ICD-10-CM | POA: Diagnosis not present

## 2018-02-08 DIAGNOSIS — D509 Iron deficiency anemia, unspecified: Secondary | ICD-10-CM | POA: Diagnosis not present

## 2018-02-08 DIAGNOSIS — E1129 Type 2 diabetes mellitus with other diabetic kidney complication: Secondary | ICD-10-CM | POA: Diagnosis not present

## 2018-02-10 DIAGNOSIS — N2581 Secondary hyperparathyroidism of renal origin: Secondary | ICD-10-CM | POA: Diagnosis not present

## 2018-02-10 DIAGNOSIS — D689 Coagulation defect, unspecified: Secondary | ICD-10-CM | POA: Diagnosis not present

## 2018-02-10 DIAGNOSIS — N186 End stage renal disease: Secondary | ICD-10-CM | POA: Diagnosis not present

## 2018-02-10 DIAGNOSIS — E1129 Type 2 diabetes mellitus with other diabetic kidney complication: Secondary | ICD-10-CM | POA: Diagnosis not present

## 2018-02-10 DIAGNOSIS — D509 Iron deficiency anemia, unspecified: Secondary | ICD-10-CM | POA: Diagnosis not present

## 2018-02-12 DIAGNOSIS — N2581 Secondary hyperparathyroidism of renal origin: Secondary | ICD-10-CM | POA: Diagnosis not present

## 2018-02-12 DIAGNOSIS — E1129 Type 2 diabetes mellitus with other diabetic kidney complication: Secondary | ICD-10-CM | POA: Diagnosis not present

## 2018-02-12 DIAGNOSIS — N186 End stage renal disease: Secondary | ICD-10-CM | POA: Diagnosis not present

## 2018-02-12 DIAGNOSIS — D689 Coagulation defect, unspecified: Secondary | ICD-10-CM | POA: Diagnosis not present

## 2018-02-12 DIAGNOSIS — D509 Iron deficiency anemia, unspecified: Secondary | ICD-10-CM | POA: Diagnosis not present

## 2018-02-15 DIAGNOSIS — D689 Coagulation defect, unspecified: Secondary | ICD-10-CM | POA: Diagnosis not present

## 2018-02-15 DIAGNOSIS — D509 Iron deficiency anemia, unspecified: Secondary | ICD-10-CM | POA: Diagnosis not present

## 2018-02-15 DIAGNOSIS — E1129 Type 2 diabetes mellitus with other diabetic kidney complication: Secondary | ICD-10-CM | POA: Diagnosis not present

## 2018-02-15 DIAGNOSIS — D631 Anemia in chronic kidney disease: Secondary | ICD-10-CM | POA: Diagnosis not present

## 2018-02-15 DIAGNOSIS — N186 End stage renal disease: Secondary | ICD-10-CM | POA: Diagnosis not present

## 2018-02-15 DIAGNOSIS — N2581 Secondary hyperparathyroidism of renal origin: Secondary | ICD-10-CM | POA: Diagnosis not present

## 2018-02-17 DIAGNOSIS — D631 Anemia in chronic kidney disease: Secondary | ICD-10-CM | POA: Diagnosis not present

## 2018-02-17 DIAGNOSIS — D689 Coagulation defect, unspecified: Secondary | ICD-10-CM | POA: Diagnosis not present

## 2018-02-17 DIAGNOSIS — N186 End stage renal disease: Secondary | ICD-10-CM | POA: Diagnosis not present

## 2018-02-17 DIAGNOSIS — N2581 Secondary hyperparathyroidism of renal origin: Secondary | ICD-10-CM | POA: Diagnosis not present

## 2018-02-17 DIAGNOSIS — D509 Iron deficiency anemia, unspecified: Secondary | ICD-10-CM | POA: Diagnosis not present

## 2018-02-17 DIAGNOSIS — E1129 Type 2 diabetes mellitus with other diabetic kidney complication: Secondary | ICD-10-CM | POA: Diagnosis not present

## 2018-02-18 DIAGNOSIS — D472 Monoclonal gammopathy: Secondary | ICD-10-CM | POA: Diagnosis not present

## 2018-02-18 DIAGNOSIS — Z0000 Encounter for general adult medical examination without abnormal findings: Secondary | ICD-10-CM | POA: Diagnosis not present

## 2018-02-18 DIAGNOSIS — N186 End stage renal disease: Secondary | ICD-10-CM | POA: Diagnosis not present

## 2018-02-18 DIAGNOSIS — I120 Hypertensive chronic kidney disease with stage 5 chronic kidney disease or end stage renal disease: Secondary | ICD-10-CM | POA: Diagnosis not present

## 2018-02-19 ENCOUNTER — Other Ambulatory Visit: Payer: Self-pay

## 2018-02-19 ENCOUNTER — Telehealth: Payer: Self-pay | Admitting: Neurology

## 2018-02-19 ENCOUNTER — Other Ambulatory Visit: Payer: Self-pay | Admitting: Nurse Practitioner

## 2018-02-19 DIAGNOSIS — E1129 Type 2 diabetes mellitus with other diabetic kidney complication: Secondary | ICD-10-CM | POA: Diagnosis not present

## 2018-02-19 DIAGNOSIS — N2581 Secondary hyperparathyroidism of renal origin: Secondary | ICD-10-CM | POA: Diagnosis not present

## 2018-02-19 DIAGNOSIS — N186 End stage renal disease: Secondary | ICD-10-CM | POA: Diagnosis not present

## 2018-02-19 DIAGNOSIS — D631 Anemia in chronic kidney disease: Secondary | ICD-10-CM | POA: Diagnosis not present

## 2018-02-19 DIAGNOSIS — D509 Iron deficiency anemia, unspecified: Secondary | ICD-10-CM | POA: Diagnosis not present

## 2018-02-19 DIAGNOSIS — D689 Coagulation defect, unspecified: Secondary | ICD-10-CM | POA: Diagnosis not present

## 2018-02-19 MED ORDER — LAMOTRIGINE 150 MG PO TABS: 150.0000 mg | ORAL_TABLET | Freq: Two times a day (BID) | ORAL | 1 refills | Status: DC

## 2018-02-19 MED FILL — lamoTRIgine 150 MG TABS: 150 | 90 days supply | Qty: 180 | Fill #0

## 2018-02-19 NOTE — Telephone Encounter (Signed)
Rx sent 

## 2018-02-19 NOTE — Telephone Encounter (Deleted)
Rx sent 

## 2018-02-22 DIAGNOSIS — D689 Coagulation defect, unspecified: Secondary | ICD-10-CM | POA: Diagnosis not present

## 2018-02-22 DIAGNOSIS — Z7682 Awaiting organ transplant status: Secondary | ICD-10-CM | POA: Diagnosis not present

## 2018-02-22 DIAGNOSIS — N186 End stage renal disease: Secondary | ICD-10-CM | POA: Diagnosis not present

## 2018-02-22 DIAGNOSIS — N2581 Secondary hyperparathyroidism of renal origin: Secondary | ICD-10-CM | POA: Diagnosis not present

## 2018-02-22 DIAGNOSIS — E1129 Type 2 diabetes mellitus with other diabetic kidney complication: Secondary | ICD-10-CM | POA: Diagnosis not present

## 2018-02-22 DIAGNOSIS — D509 Iron deficiency anemia, unspecified: Secondary | ICD-10-CM | POA: Diagnosis not present

## 2018-02-22 MED FILL — VIMPAT 150 MG TABLET: 150 | 30 days supply | Qty: 75 | Fill #4 | Status: TO

## 2018-02-23 MED FILL — SENSIPAR 60 MG TABLET: 60 | 30 days supply | Qty: 30 | Fill #5

## 2018-02-23 MED FILL — SM ALCOHOL 70% PREP PADS: 70 | 33 days supply | Qty: 100 | Fill #1 | Status: TO

## 2018-02-24 DIAGNOSIS — D509 Iron deficiency anemia, unspecified: Secondary | ICD-10-CM | POA: Diagnosis not present

## 2018-02-24 DIAGNOSIS — N186 End stage renal disease: Secondary | ICD-10-CM | POA: Diagnosis not present

## 2018-02-24 DIAGNOSIS — N2581 Secondary hyperparathyroidism of renal origin: Secondary | ICD-10-CM | POA: Diagnosis not present

## 2018-02-24 DIAGNOSIS — E1129 Type 2 diabetes mellitus with other diabetic kidney complication: Secondary | ICD-10-CM | POA: Diagnosis not present

## 2018-02-24 DIAGNOSIS — D689 Coagulation defect, unspecified: Secondary | ICD-10-CM | POA: Diagnosis not present

## 2018-02-26 DIAGNOSIS — D509 Iron deficiency anemia, unspecified: Secondary | ICD-10-CM | POA: Diagnosis not present

## 2018-02-26 DIAGNOSIS — N2581 Secondary hyperparathyroidism of renal origin: Secondary | ICD-10-CM | POA: Diagnosis not present

## 2018-02-26 DIAGNOSIS — N186 End stage renal disease: Secondary | ICD-10-CM | POA: Diagnosis not present

## 2018-02-26 DIAGNOSIS — E1129 Type 2 diabetes mellitus with other diabetic kidney complication: Secondary | ICD-10-CM | POA: Diagnosis not present

## 2018-02-26 DIAGNOSIS — D689 Coagulation defect, unspecified: Secondary | ICD-10-CM | POA: Diagnosis not present

## 2018-03-01 DIAGNOSIS — N2581 Secondary hyperparathyroidism of renal origin: Secondary | ICD-10-CM | POA: Diagnosis not present

## 2018-03-01 DIAGNOSIS — D631 Anemia in chronic kidney disease: Secondary | ICD-10-CM | POA: Diagnosis not present

## 2018-03-01 DIAGNOSIS — N186 End stage renal disease: Secondary | ICD-10-CM | POA: Diagnosis not present

## 2018-03-01 DIAGNOSIS — E1129 Type 2 diabetes mellitus with other diabetic kidney complication: Secondary | ICD-10-CM | POA: Diagnosis not present

## 2018-03-01 DIAGNOSIS — D689 Coagulation defect, unspecified: Secondary | ICD-10-CM | POA: Diagnosis not present

## 2018-03-01 DIAGNOSIS — D509 Iron deficiency anemia, unspecified: Secondary | ICD-10-CM | POA: Diagnosis not present

## 2018-03-03 DIAGNOSIS — N2581 Secondary hyperparathyroidism of renal origin: Secondary | ICD-10-CM | POA: Diagnosis not present

## 2018-03-03 DIAGNOSIS — N186 End stage renal disease: Secondary | ICD-10-CM | POA: Diagnosis not present

## 2018-03-03 DIAGNOSIS — D689 Coagulation defect, unspecified: Secondary | ICD-10-CM | POA: Diagnosis not present

## 2018-03-03 DIAGNOSIS — D631 Anemia in chronic kidney disease: Secondary | ICD-10-CM | POA: Diagnosis not present

## 2018-03-03 DIAGNOSIS — D509 Iron deficiency anemia, unspecified: Secondary | ICD-10-CM | POA: Diagnosis not present

## 2018-03-03 DIAGNOSIS — E1129 Type 2 diabetes mellitus with other diabetic kidney complication: Secondary | ICD-10-CM | POA: Diagnosis not present

## 2018-03-05 DIAGNOSIS — D509 Iron deficiency anemia, unspecified: Secondary | ICD-10-CM | POA: Diagnosis not present

## 2018-03-05 DIAGNOSIS — N2581 Secondary hyperparathyroidism of renal origin: Secondary | ICD-10-CM | POA: Diagnosis not present

## 2018-03-05 DIAGNOSIS — D689 Coagulation defect, unspecified: Secondary | ICD-10-CM | POA: Diagnosis not present

## 2018-03-05 DIAGNOSIS — N186 End stage renal disease: Secondary | ICD-10-CM | POA: Diagnosis not present

## 2018-03-05 DIAGNOSIS — E1129 Type 2 diabetes mellitus with other diabetic kidney complication: Secondary | ICD-10-CM | POA: Diagnosis not present

## 2018-03-05 DIAGNOSIS — D631 Anemia in chronic kidney disease: Secondary | ICD-10-CM | POA: Diagnosis not present

## 2018-03-07 DIAGNOSIS — N186 End stage renal disease: Secondary | ICD-10-CM | POA: Diagnosis not present

## 2018-03-07 DIAGNOSIS — Z992 Dependence on renal dialysis: Secondary | ICD-10-CM | POA: Diagnosis not present

## 2018-03-07 DIAGNOSIS — E1122 Type 2 diabetes mellitus with diabetic chronic kidney disease: Secondary | ICD-10-CM | POA: Diagnosis not present

## 2018-03-08 DIAGNOSIS — D509 Iron deficiency anemia, unspecified: Secondary | ICD-10-CM | POA: Diagnosis not present

## 2018-03-08 DIAGNOSIS — N2581 Secondary hyperparathyroidism of renal origin: Secondary | ICD-10-CM | POA: Diagnosis not present

## 2018-03-08 DIAGNOSIS — N186 End stage renal disease: Secondary | ICD-10-CM | POA: Diagnosis not present

## 2018-03-08 DIAGNOSIS — E1129 Type 2 diabetes mellitus with other diabetic kidney complication: Secondary | ICD-10-CM | POA: Diagnosis not present

## 2018-03-08 DIAGNOSIS — D689 Coagulation defect, unspecified: Secondary | ICD-10-CM | POA: Diagnosis not present

## 2018-03-10 DIAGNOSIS — N186 End stage renal disease: Secondary | ICD-10-CM | POA: Diagnosis not present

## 2018-03-10 DIAGNOSIS — D509 Iron deficiency anemia, unspecified: Secondary | ICD-10-CM | POA: Diagnosis not present

## 2018-03-10 DIAGNOSIS — D689 Coagulation defect, unspecified: Secondary | ICD-10-CM | POA: Diagnosis not present

## 2018-03-10 DIAGNOSIS — N2581 Secondary hyperparathyroidism of renal origin: Secondary | ICD-10-CM | POA: Diagnosis not present

## 2018-03-10 DIAGNOSIS — E1129 Type 2 diabetes mellitus with other diabetic kidney complication: Secondary | ICD-10-CM | POA: Diagnosis not present

## 2018-03-12 DIAGNOSIS — N2581 Secondary hyperparathyroidism of renal origin: Secondary | ICD-10-CM | POA: Diagnosis not present

## 2018-03-12 DIAGNOSIS — D509 Iron deficiency anemia, unspecified: Secondary | ICD-10-CM | POA: Diagnosis not present

## 2018-03-12 DIAGNOSIS — E1129 Type 2 diabetes mellitus with other diabetic kidney complication: Secondary | ICD-10-CM | POA: Diagnosis not present

## 2018-03-12 DIAGNOSIS — D689 Coagulation defect, unspecified: Secondary | ICD-10-CM | POA: Diagnosis not present

## 2018-03-12 DIAGNOSIS — N186 End stage renal disease: Secondary | ICD-10-CM | POA: Diagnosis not present

## 2018-03-15 DIAGNOSIS — N2581 Secondary hyperparathyroidism of renal origin: Secondary | ICD-10-CM | POA: Diagnosis not present

## 2018-03-15 DIAGNOSIS — D689 Coagulation defect, unspecified: Secondary | ICD-10-CM | POA: Diagnosis not present

## 2018-03-15 DIAGNOSIS — D509 Iron deficiency anemia, unspecified: Secondary | ICD-10-CM | POA: Diagnosis not present

## 2018-03-15 DIAGNOSIS — E1129 Type 2 diabetes mellitus with other diabetic kidney complication: Secondary | ICD-10-CM | POA: Diagnosis not present

## 2018-03-15 DIAGNOSIS — D631 Anemia in chronic kidney disease: Secondary | ICD-10-CM | POA: Diagnosis not present

## 2018-03-15 DIAGNOSIS — N186 End stage renal disease: Secondary | ICD-10-CM | POA: Diagnosis not present

## 2018-03-16 DIAGNOSIS — E109 Type 1 diabetes mellitus without complications: Secondary | ICD-10-CM | POA: Diagnosis not present

## 2018-03-17 DIAGNOSIS — N186 End stage renal disease: Secondary | ICD-10-CM | POA: Diagnosis not present

## 2018-03-17 DIAGNOSIS — D631 Anemia in chronic kidney disease: Secondary | ICD-10-CM | POA: Diagnosis not present

## 2018-03-17 DIAGNOSIS — D689 Coagulation defect, unspecified: Secondary | ICD-10-CM | POA: Diagnosis not present

## 2018-03-17 DIAGNOSIS — D509 Iron deficiency anemia, unspecified: Secondary | ICD-10-CM | POA: Diagnosis not present

## 2018-03-17 DIAGNOSIS — N2581 Secondary hyperparathyroidism of renal origin: Secondary | ICD-10-CM | POA: Diagnosis not present

## 2018-03-17 DIAGNOSIS — E1129 Type 2 diabetes mellitus with other diabetic kidney complication: Secondary | ICD-10-CM | POA: Diagnosis not present

## 2018-03-18 DIAGNOSIS — M2041 Other hammer toe(s) (acquired), right foot: Secondary | ICD-10-CM | POA: Diagnosis not present

## 2018-03-18 DIAGNOSIS — B351 Tinea unguium: Secondary | ICD-10-CM | POA: Diagnosis not present

## 2018-03-18 DIAGNOSIS — M2042 Other hammer toe(s) (acquired), left foot: Secondary | ICD-10-CM | POA: Diagnosis not present

## 2018-03-19 DIAGNOSIS — D509 Iron deficiency anemia, unspecified: Secondary | ICD-10-CM | POA: Diagnosis not present

## 2018-03-19 DIAGNOSIS — N186 End stage renal disease: Secondary | ICD-10-CM | POA: Diagnosis not present

## 2018-03-19 DIAGNOSIS — D689 Coagulation defect, unspecified: Secondary | ICD-10-CM | POA: Diagnosis not present

## 2018-03-19 DIAGNOSIS — D631 Anemia in chronic kidney disease: Secondary | ICD-10-CM | POA: Diagnosis not present

## 2018-03-19 DIAGNOSIS — E1129 Type 2 diabetes mellitus with other diabetic kidney complication: Secondary | ICD-10-CM | POA: Diagnosis not present

## 2018-03-19 DIAGNOSIS — N2581 Secondary hyperparathyroidism of renal origin: Secondary | ICD-10-CM | POA: Diagnosis not present

## 2018-03-22 DIAGNOSIS — N2581 Secondary hyperparathyroidism of renal origin: Secondary | ICD-10-CM | POA: Diagnosis not present

## 2018-03-22 DIAGNOSIS — D689 Coagulation defect, unspecified: Secondary | ICD-10-CM | POA: Diagnosis not present

## 2018-03-22 DIAGNOSIS — N186 End stage renal disease: Secondary | ICD-10-CM | POA: Diagnosis not present

## 2018-03-22 DIAGNOSIS — D509 Iron deficiency anemia, unspecified: Secondary | ICD-10-CM | POA: Diagnosis not present

## 2018-03-22 DIAGNOSIS — E1129 Type 2 diabetes mellitus with other diabetic kidney complication: Secondary | ICD-10-CM | POA: Diagnosis not present

## 2018-03-24 DIAGNOSIS — D509 Iron deficiency anemia, unspecified: Secondary | ICD-10-CM | POA: Diagnosis not present

## 2018-03-24 DIAGNOSIS — E1129 Type 2 diabetes mellitus with other diabetic kidney complication: Secondary | ICD-10-CM | POA: Diagnosis not present

## 2018-03-24 DIAGNOSIS — D689 Coagulation defect, unspecified: Secondary | ICD-10-CM | POA: Diagnosis not present

## 2018-03-24 DIAGNOSIS — N2581 Secondary hyperparathyroidism of renal origin: Secondary | ICD-10-CM | POA: Diagnosis not present

## 2018-03-24 DIAGNOSIS — N186 End stage renal disease: Secondary | ICD-10-CM | POA: Diagnosis not present

## 2018-03-26 DIAGNOSIS — D689 Coagulation defect, unspecified: Secondary | ICD-10-CM | POA: Diagnosis not present

## 2018-03-26 DIAGNOSIS — N186 End stage renal disease: Secondary | ICD-10-CM | POA: Diagnosis not present

## 2018-03-26 DIAGNOSIS — N2581 Secondary hyperparathyroidism of renal origin: Secondary | ICD-10-CM | POA: Diagnosis not present

## 2018-03-26 DIAGNOSIS — E1129 Type 2 diabetes mellitus with other diabetic kidney complication: Secondary | ICD-10-CM | POA: Diagnosis not present

## 2018-03-26 DIAGNOSIS — D509 Iron deficiency anemia, unspecified: Secondary | ICD-10-CM | POA: Diagnosis not present

## 2018-03-29 DIAGNOSIS — D631 Anemia in chronic kidney disease: Secondary | ICD-10-CM | POA: Diagnosis not present

## 2018-03-29 DIAGNOSIS — N186 End stage renal disease: Secondary | ICD-10-CM | POA: Diagnosis not present

## 2018-03-29 DIAGNOSIS — N2581 Secondary hyperparathyroidism of renal origin: Secondary | ICD-10-CM | POA: Diagnosis not present

## 2018-03-29 DIAGNOSIS — D689 Coagulation defect, unspecified: Secondary | ICD-10-CM | POA: Diagnosis not present

## 2018-03-29 DIAGNOSIS — D509 Iron deficiency anemia, unspecified: Secondary | ICD-10-CM | POA: Diagnosis not present

## 2018-03-29 DIAGNOSIS — E1129 Type 2 diabetes mellitus with other diabetic kidney complication: Secondary | ICD-10-CM | POA: Diagnosis not present

## 2018-03-31 DIAGNOSIS — N186 End stage renal disease: Secondary | ICD-10-CM | POA: Diagnosis not present

## 2018-03-31 DIAGNOSIS — E1129 Type 2 diabetes mellitus with other diabetic kidney complication: Secondary | ICD-10-CM | POA: Diagnosis not present

## 2018-03-31 DIAGNOSIS — D689 Coagulation defect, unspecified: Secondary | ICD-10-CM | POA: Diagnosis not present

## 2018-03-31 DIAGNOSIS — N2581 Secondary hyperparathyroidism of renal origin: Secondary | ICD-10-CM | POA: Diagnosis not present

## 2018-03-31 DIAGNOSIS — D509 Iron deficiency anemia, unspecified: Secondary | ICD-10-CM | POA: Diagnosis not present

## 2018-03-31 DIAGNOSIS — D631 Anemia in chronic kidney disease: Secondary | ICD-10-CM | POA: Diagnosis not present

## 2018-04-01 DIAGNOSIS — Z794 Long term (current) use of insulin: Secondary | ICD-10-CM | POA: Diagnosis not present

## 2018-04-01 DIAGNOSIS — N186 End stage renal disease: Secondary | ICD-10-CM | POA: Diagnosis not present

## 2018-04-01 DIAGNOSIS — E1122 Type 2 diabetes mellitus with diabetic chronic kidney disease: Secondary | ICD-10-CM | POA: Diagnosis not present

## 2018-04-01 DIAGNOSIS — Z7682 Awaiting organ transplant status: Secondary | ICD-10-CM | POA: Diagnosis not present

## 2018-04-01 DIAGNOSIS — Z114 Encounter for screening for human immunodeficiency virus [HIV]: Secondary | ICD-10-CM | POA: Diagnosis not present

## 2018-04-01 DIAGNOSIS — Z1159 Encounter for screening for other viral diseases: Secondary | ICD-10-CM | POA: Diagnosis not present

## 2018-04-01 DIAGNOSIS — Z125 Encounter for screening for malignant neoplasm of prostate: Secondary | ICD-10-CM | POA: Diagnosis not present

## 2018-04-01 DIAGNOSIS — E119 Type 2 diabetes mellitus without complications: Secondary | ICD-10-CM | POA: Diagnosis not present

## 2018-04-01 DIAGNOSIS — Z992 Dependence on renal dialysis: Secondary | ICD-10-CM | POA: Diagnosis not present

## 2018-04-02 DIAGNOSIS — E1129 Type 2 diabetes mellitus with other diabetic kidney complication: Secondary | ICD-10-CM | POA: Diagnosis not present

## 2018-04-02 DIAGNOSIS — D631 Anemia in chronic kidney disease: Secondary | ICD-10-CM | POA: Diagnosis not present

## 2018-04-02 DIAGNOSIS — D509 Iron deficiency anemia, unspecified: Secondary | ICD-10-CM | POA: Diagnosis not present

## 2018-04-02 DIAGNOSIS — N2581 Secondary hyperparathyroidism of renal origin: Secondary | ICD-10-CM | POA: Diagnosis not present

## 2018-04-02 DIAGNOSIS — D689 Coagulation defect, unspecified: Secondary | ICD-10-CM | POA: Diagnosis not present

## 2018-04-02 DIAGNOSIS — N186 End stage renal disease: Secondary | ICD-10-CM | POA: Diagnosis not present

## 2018-04-05 DIAGNOSIS — E1129 Type 2 diabetes mellitus with other diabetic kidney complication: Secondary | ICD-10-CM | POA: Diagnosis not present

## 2018-04-05 DIAGNOSIS — N186 End stage renal disease: Secondary | ICD-10-CM | POA: Diagnosis not present

## 2018-04-05 DIAGNOSIS — N2581 Secondary hyperparathyroidism of renal origin: Secondary | ICD-10-CM | POA: Diagnosis not present

## 2018-04-05 DIAGNOSIS — D509 Iron deficiency anemia, unspecified: Secondary | ICD-10-CM | POA: Diagnosis not present

## 2018-04-05 DIAGNOSIS — D689 Coagulation defect, unspecified: Secondary | ICD-10-CM | POA: Diagnosis not present

## 2018-04-06 ENCOUNTER — Telehealth: Payer: Self-pay | Admitting: *Deleted

## 2018-04-06 NOTE — Telephone Encounter (Signed)
Faxed [facility] to [facility] - Att: Hope C; release [phone]

## 2018-04-07 DIAGNOSIS — Z992 Dependence on renal dialysis: Secondary | ICD-10-CM | POA: Diagnosis not present

## 2018-04-07 DIAGNOSIS — N2581 Secondary hyperparathyroidism of renal origin: Secondary | ICD-10-CM | POA: Diagnosis not present

## 2018-04-07 DIAGNOSIS — D689 Coagulation defect, unspecified: Secondary | ICD-10-CM | POA: Diagnosis not present

## 2018-04-07 DIAGNOSIS — E1122 Type 2 diabetes mellitus with diabetic chronic kidney disease: Secondary | ICD-10-CM | POA: Diagnosis not present

## 2018-04-07 DIAGNOSIS — N186 End stage renal disease: Secondary | ICD-10-CM | POA: Diagnosis not present

## 2018-04-07 DIAGNOSIS — D509 Iron deficiency anemia, unspecified: Secondary | ICD-10-CM | POA: Diagnosis not present

## 2018-04-07 DIAGNOSIS — E1129 Type 2 diabetes mellitus with other diabetic kidney complication: Secondary | ICD-10-CM | POA: Diagnosis not present

## 2018-04-09 DIAGNOSIS — N2581 Secondary hyperparathyroidism of renal origin: Secondary | ICD-10-CM | POA: Diagnosis not present

## 2018-04-09 DIAGNOSIS — N186 End stage renal disease: Secondary | ICD-10-CM | POA: Diagnosis not present

## 2018-04-09 DIAGNOSIS — E1129 Type 2 diabetes mellitus with other diabetic kidney complication: Secondary | ICD-10-CM | POA: Diagnosis not present

## 2018-04-09 DIAGNOSIS — D689 Coagulation defect, unspecified: Secondary | ICD-10-CM | POA: Diagnosis not present

## 2018-04-12 DIAGNOSIS — E1129 Type 2 diabetes mellitus with other diabetic kidney complication: Secondary | ICD-10-CM | POA: Diagnosis not present

## 2018-04-12 DIAGNOSIS — N186 End stage renal disease: Secondary | ICD-10-CM | POA: Diagnosis not present

## 2018-04-12 DIAGNOSIS — D689 Coagulation defect, unspecified: Secondary | ICD-10-CM | POA: Diagnosis not present

## 2018-04-12 DIAGNOSIS — D509 Iron deficiency anemia, unspecified: Secondary | ICD-10-CM | POA: Diagnosis not present

## 2018-04-12 DIAGNOSIS — D631 Anemia in chronic kidney disease: Secondary | ICD-10-CM | POA: Diagnosis not present

## 2018-04-12 DIAGNOSIS — N2581 Secondary hyperparathyroidism of renal origin: Secondary | ICD-10-CM | POA: Diagnosis not present

## 2018-04-13 DIAGNOSIS — N186 End stage renal disease: Secondary | ICD-10-CM | POA: Diagnosis not present

## 2018-04-13 DIAGNOSIS — E109 Type 1 diabetes mellitus without complications: Secondary | ICD-10-CM | POA: Diagnosis not present

## 2018-04-13 DIAGNOSIS — E162 Hypoglycemia, unspecified: Secondary | ICD-10-CM | POA: Diagnosis not present

## 2018-04-13 DIAGNOSIS — I10 Essential (primary) hypertension: Secondary | ICD-10-CM | POA: Diagnosis not present

## 2018-04-14 DIAGNOSIS — D631 Anemia in chronic kidney disease: Secondary | ICD-10-CM | POA: Diagnosis not present

## 2018-04-14 DIAGNOSIS — D689 Coagulation defect, unspecified: Secondary | ICD-10-CM | POA: Diagnosis not present

## 2018-04-14 DIAGNOSIS — E1129 Type 2 diabetes mellitus with other diabetic kidney complication: Secondary | ICD-10-CM | POA: Diagnosis not present

## 2018-04-14 DIAGNOSIS — N186 End stage renal disease: Secondary | ICD-10-CM | POA: Diagnosis not present

## 2018-04-14 DIAGNOSIS — D509 Iron deficiency anemia, unspecified: Secondary | ICD-10-CM | POA: Diagnosis not present

## 2018-04-14 DIAGNOSIS — N2581 Secondary hyperparathyroidism of renal origin: Secondary | ICD-10-CM | POA: Diagnosis not present

## 2018-04-16 DIAGNOSIS — N2581 Secondary hyperparathyroidism of renal origin: Secondary | ICD-10-CM | POA: Diagnosis not present

## 2018-04-16 DIAGNOSIS — D509 Iron deficiency anemia, unspecified: Secondary | ICD-10-CM | POA: Diagnosis not present

## 2018-04-16 DIAGNOSIS — D631 Anemia in chronic kidney disease: Secondary | ICD-10-CM | POA: Diagnosis not present

## 2018-04-16 DIAGNOSIS — N186 End stage renal disease: Secondary | ICD-10-CM | POA: Diagnosis not present

## 2018-04-16 DIAGNOSIS — D689 Coagulation defect, unspecified: Secondary | ICD-10-CM | POA: Diagnosis not present

## 2018-04-16 DIAGNOSIS — E1129 Type 2 diabetes mellitus with other diabetic kidney complication: Secondary | ICD-10-CM | POA: Diagnosis not present

## 2018-04-19 DIAGNOSIS — N186 End stage renal disease: Secondary | ICD-10-CM | POA: Diagnosis not present

## 2018-04-19 DIAGNOSIS — D689 Coagulation defect, unspecified: Secondary | ICD-10-CM | POA: Diagnosis not present

## 2018-04-19 DIAGNOSIS — D509 Iron deficiency anemia, unspecified: Secondary | ICD-10-CM | POA: Diagnosis not present

## 2018-04-19 DIAGNOSIS — E1129 Type 2 diabetes mellitus with other diabetic kidney complication: Secondary | ICD-10-CM | POA: Diagnosis not present

## 2018-04-19 DIAGNOSIS — N2581 Secondary hyperparathyroidism of renal origin: Secondary | ICD-10-CM | POA: Diagnosis not present

## 2018-04-21 DIAGNOSIS — N186 End stage renal disease: Secondary | ICD-10-CM | POA: Diagnosis not present

## 2018-04-21 DIAGNOSIS — E1129 Type 2 diabetes mellitus with other diabetic kidney complication: Secondary | ICD-10-CM | POA: Diagnosis not present

## 2018-04-21 DIAGNOSIS — D689 Coagulation defect, unspecified: Secondary | ICD-10-CM | POA: Diagnosis not present

## 2018-04-21 DIAGNOSIS — D509 Iron deficiency anemia, unspecified: Secondary | ICD-10-CM | POA: Diagnosis not present

## 2018-04-21 DIAGNOSIS — N2581 Secondary hyperparathyroidism of renal origin: Secondary | ICD-10-CM | POA: Diagnosis not present

## 2018-04-23 DIAGNOSIS — D509 Iron deficiency anemia, unspecified: Secondary | ICD-10-CM | POA: Diagnosis not present

## 2018-04-23 DIAGNOSIS — E1129 Type 2 diabetes mellitus with other diabetic kidney complication: Secondary | ICD-10-CM | POA: Diagnosis not present

## 2018-04-23 DIAGNOSIS — N186 End stage renal disease: Secondary | ICD-10-CM | POA: Diagnosis not present

## 2018-04-23 DIAGNOSIS — D689 Coagulation defect, unspecified: Secondary | ICD-10-CM | POA: Diagnosis not present

## 2018-04-23 DIAGNOSIS — N2581 Secondary hyperparathyroidism of renal origin: Secondary | ICD-10-CM | POA: Diagnosis not present

## 2018-04-26 DIAGNOSIS — E1129 Type 2 diabetes mellitus with other diabetic kidney complication: Secondary | ICD-10-CM | POA: Diagnosis not present

## 2018-04-26 DIAGNOSIS — D509 Iron deficiency anemia, unspecified: Secondary | ICD-10-CM | POA: Diagnosis not present

## 2018-04-26 DIAGNOSIS — N2581 Secondary hyperparathyroidism of renal origin: Secondary | ICD-10-CM | POA: Diagnosis not present

## 2018-04-26 DIAGNOSIS — N186 End stage renal disease: Secondary | ICD-10-CM | POA: Diagnosis not present

## 2018-04-26 DIAGNOSIS — D689 Coagulation defect, unspecified: Secondary | ICD-10-CM | POA: Diagnosis not present

## 2018-04-28 DIAGNOSIS — D509 Iron deficiency anemia, unspecified: Secondary | ICD-10-CM | POA: Diagnosis not present

## 2018-04-28 DIAGNOSIS — N186 End stage renal disease: Secondary | ICD-10-CM | POA: Diagnosis not present

## 2018-04-28 DIAGNOSIS — E1129 Type 2 diabetes mellitus with other diabetic kidney complication: Secondary | ICD-10-CM | POA: Diagnosis not present

## 2018-04-28 DIAGNOSIS — D689 Coagulation defect, unspecified: Secondary | ICD-10-CM | POA: Diagnosis not present

## 2018-04-28 DIAGNOSIS — N2581 Secondary hyperparathyroidism of renal origin: Secondary | ICD-10-CM | POA: Diagnosis not present

## 2018-04-29 ENCOUNTER — Ambulatory Visit (INDEPENDENT_AMBULATORY_CARE_PROVIDER_SITE_OTHER): Payer: 59 | Admitting: Neurology

## 2018-04-29 ENCOUNTER — Encounter: Payer: Self-pay | Admitting: Neurology

## 2018-04-29 ENCOUNTER — Other Ambulatory Visit: Payer: Self-pay

## 2018-04-29 VITALS — BP 142/76 | HR 64 | Ht 67.0 in | Wt 215.0 lb

## 2018-04-29 DIAGNOSIS — I69351 Hemiplegia and hemiparesis following cerebral infarction affecting right dominant side: Secondary | ICD-10-CM

## 2018-04-29 DIAGNOSIS — G40209 Localization-related (focal) (partial) symptomatic epilepsy and epileptic syndromes with complex partial seizures, not intractable, without status epilepticus: Secondary | ICD-10-CM | POA: Diagnosis not present

## 2018-04-29 DIAGNOSIS — Z8673 Personal history of transient ischemic attack (TIA), and cerebral infarction without residual deficits: Secondary | ICD-10-CM | POA: Diagnosis not present

## 2018-04-29 MED ORDER — LAMOTRIGINE 150 MG PO TABS: 150.0000 mg | ORAL_TABLET | Freq: Two times a day (BID) | ORAL | 3 refills | Status: DC

## 2018-04-29 MED ORDER — LACOSAMIDE 150 MG PO TABS: ORAL_TABLET | ORAL | 5 refills | Status: DC

## 2018-04-29 MED ORDER — CLONAZEPAM 0.5 MG PO TABS: ORAL_TABLET | ORAL | 5 refills | Status: DC

## 2018-04-29 NOTE — Patient Instructions (Signed)
1. Continue all your medications 2. Recommend having a driving evaluation to help determine return to driving You can contact [employer] in [place] or [employer] [phone]. 3. Follow-up in 6 months or so, call for any changes  Seizure Precautions: 1. If medication has been prescribed for you to prevent seizures, take it exactly as directed.  Do not stop taking the medicine without talking to your doctor first, even if you have not had a seizure in a long time.   2. Avoid activities in which a seizure would cause danger to yourself or to others.  Don't operate dangerous machinery, swim alone, or climb in high or dangerous places, such as on ladders, roofs, or girders.  Do not drive unless your doctor says you may.  3. If you have any warning that you may have a seizure, lay down in a safe place where you can't hurt yourself.    4.  No driving for 6 months from last seizure, as per [facility].   Please refer to the following link on the [facility] website for more information: http://[email or web address].org/answerplace/Social/driving/drivingu.cfm   5.  Maintain good sleep hygiene. Avoid alcohol.  6.  Contact your doctor if you have any problems that may be related to the medicine you are taking.  7.  Call 911 and bring the patient back to the ED if:        A.  The seizure lasts longer than 5 minutes.       B.  The patient doesn't awaken shortly after the seizure  C.  The patient has new problems such as difficulty seeing, speaking or moving  D.  The patient was injured during the seizure  E.  The patient has a temperature over 102 F (39C)  F.  The patient vomited and now is having trouble breathing

## 2018-04-29 NOTE — Progress Notes (Signed)
NEUROLOGY FOLLOW UP OFFICE NOTE  [name] [id number] 1966/08/15  HISTORY OF PRESENT ILLNESS: I had the pleasure of seeing Isaiah [name] in follow-up in the neurology clinic on 04/29/2018.  The patient was last seen 6 months ago for seizures after multiple strokes. He is again accompanied by his wife who helps supplement the history today. He was having seizures usually after dialysis on Vimpat 169m BID and Lamictal 1582mBID. He was instructed to take an additional dose of Vimpat after dialysis, however his wife called on 11/23/17 to report another seizure right after dialysis. He had just taken the additional dose of Vimpat and had a seizure as he was being unhooked from dialysis. He was started on clonazepam 0.5 to take 1 hour prior to dialysis. Since March, he has not had any further seizures. He reports doing much better overall. He is in good spirits today. His wife denies any staring/unresponsive episodes. He denies any gaps in time, olfactory/gustatory hallucinations, worsening focal numbness/tingling/weakness, myoclonic jerks. He denies any headaches, dizziness, diplopia, no falls.   Hisotyr of Initial Assessment 10/29/2017: This is a pleasant [age]o RH man with a history of diabetes, sickle-cell thalassemia, DVT and PE off Coumadin due to GI bleed, sleep apnea, MGUS, CKD on dialysis, and strokes in multiple vascular distributions in January 2017 with subsequent seizures. Stroke workup showed left M2 superior branch occlusion, TEE with PFO, hypercoagulation studies negative. He is now on aspirin for secondary stroke prevention. He made significant improvement with right-sided weakness and speech was more fluent. He had a seizure in April 2017 and was started on Vimpat 100110mID. EEG reported diffuse slowing. Limited MRI brain showed multiple late subacute and chronic infarcts, and repeat imaging in May 2017 showed resolution with no new infarct. He had another seizure while in [place]  September 2017 after dialysis and was found to be anemic with Hgb of 6.8, requiring multiple blood transfusions. Vimpat increased to 150m60mD. EEG showed mild intermittent generalized theta slowing. He had another seizure after dialysis in November 2017 and Dilantin 300mg11mly was added. His wife did not want him to be on this long term, and was switched to Lamotrigine. He had another seizure in January 2018 a few days after Dilantin was tapered off. He had another seizure in October 2018 again after dialysis. The last seizure was on 10/19/17 again after dialysis. This was slightly different because he was belligerent and agitated and his wife had to calm him down. He is taking Vimpat 150mg 17mand Lamotrigine 150mg B2mith no side effects. He sometimes has a warning prior to the seizures. With the most recent seizure, he was standing at the kitchen table, recalls his wife picking him up, then coming to on the sofa. Seizures last 5-10 minutes, his wife describes staring off, unresponsive, then trembling/low amplitude shaking. He is tired after, with worsened right-sided weakness. He would be "pretty out of it" the next day. Majority of seizures occur right after dialysis, he would still be sitting in the dialysis unit, but the most recent seizure occurred at home an hour after dialysis. He reports BP is not an issue because his nephrologist has regulated his medications and BP is good most of the time.   He has occasional tremors in both legs, he has a right hand tremor noted in the office today. He noticed memory changes after his stroke, but does not think memory is worse after each seizure. He wonders about his short-term memory,  sometimes he has to think which is his right or left side. He denies any olfactory/gustatory hallucinations, deja vu, rising epigastric sensation,myoclonic jerks.   Epilepsy Risk Factors:  Prior strokes in multiple vascular distributions. Otherwise he had a normal birth and early  development.  There is no history of febrile convulsions, CNS infections such as meningitis/encephalitis, significant traumatic brain injury, neurosurgical procedures, or family history of seizures.  Diagnostic Data: I personally reviewed MRI brain without contrast done 01/2016 which showed left MCA infarct, right posterior temporal and occipital hemorrhagic infarct, left frontal infarct with remote blood products, remote hemorrhagic infarct in the more superior right frontal lobe, remote nonhemorrhagic lacunar infarcts in the left cerebellum.  Prior AEDs: Dilantin  PAST MEDICAL HISTORY: Past Medical History:  Diagnosis Date  . A-fib ([place])   . Acute renal failure ([place]) 07/2011  . Anemia   . Asthma   . Diabetes mellitus without complication ([place])   . ESRD (end stage renal disease) on dialysis ([place])   . Gout   . History of recent blood transfusion 10/27/14   2 Units PRBC's  . Hyperkalemia 07/2011  . Hypertension   . Monoclonal gammopathies   . Monoclonal gammopathy   . OSA on CPAP   . Pulmonary embolism ([place]) 07/2011   a. Tx with Coumadin for 6 months (unknown cause per patient).  . Pulmonary embolism ([place]) 07/2011; 09/27/2014   a. Bilat PE 07/2011 - unclear cause, tx with 6 months Coumadin.;   . Seizures ([place])   . Sickle cell-thalassemia disease ([place])    a. Sickle cell trait.  . Sleep apnea    a. uses CPAP.  [name] Stroke [facility])     MEDICATIONS: Current Outpatient Medications on File Prior to Visit  Medication Sig Dispense Refill  . albuterol (PROAIR HFA) 108 (90 Base) MCG/ACT inhaler Inhale two puffs every 4-6 hours if needed for cough or wheeze 1 Inhaler 1  . aspirin EC 81 MG tablet Take 1 tablet (81 mg total) by mouth daily. 100 tablet 0  . calcium acetate (PHOSLO) 667 MG capsule Take 2,001 mg by mouth 3 (three) times daily with meals.     . Cetirizine HCl 10 MG CAPS Take 1 capsule by mouth daily.    . cinacalcet (SENSIPAR) 60 MG tablet Take 60 mg by mouth every other day.     .  clonazePAM (KLONOPIN) 0.5 MG tablet Take 1 tablet prior to each dialysis session 15 tablet 5  . fluticasone furoate-vilanterol (BREO ELLIPTA) 100-25 MCG/INH AEPB Inhale one puff once daily to prevent cough or wheeze 60 each 5  . insulin glargine (LANTUS) 100 unit/mL SOPN Inject 8 Units into the skin at bedtime.     . Lacosamide (VIMPAT) 150 MG TABS Take 1 tablet twice a day. Take an additional 1 tablet after dialysis session three times a week. 75 tablet 11  . lamoTRIgine (LAMICTAL) 150 MG tablet Take 1 tablet (150 mg total) by mouth 2 (two) times daily. 180 tablet 1  . metoprolol tartrate (LOPRESSOR) 25 MG tablet Take 0.5 tablets (12.5 mg total) by mouth 2 (two) times daily. 60 tablet 0  . midodrine (PROAMATINE) 10 MG tablet Take 10 mg by mouth 2 (two) times daily with a meal.     . multivitamin (RENA-VIT) TABS tablet Take 1 tablet by mouth at bedtime. 30 tablet 1  . pantoprazole (PROTONIX) 40 MG tablet Take 1 tablet (40 mg total) by mouth 2 (two) times daily. (Patient taking differently: Take 40 mg by mouth  every morning. ) 60 tablet 1   No current facility-administered medications on file prior to visit.     ALLERGIES: Allergies  Allergen Reactions  . Codeine Rash    Unknown reaction (patient says it was more serious than just a rash, but he can't remember what happened)    FAMILY HISTORY: Family History  Problem Relation Age of Onset  . Hypertension Mother   . Diabetes Mother   . Stroke Father   . Sickle cell anemia Brother   . Diabetes type I Brother   . Kidney disease Brother     SOCIAL HISTORY: Social History   Socioeconomic History  . Marital status: Married    Spouse name: sylvia  . Number of children: 1  . Years of education: college  . Highest education level: Not on file  Occupational History  . Occupation: [place]  . Financial resource strain: Not on file  . Food insecurity:    Worry: Not on file    Inability: Not on file  .  Transportation needs:    Medical: Not on file    Non-medical: Not on file  Tobacco Use  . Smoking status: Never Smoker  . Smokeless tobacco: Never Used  Substance and Sexual Activity  . Alcohol use: No    Alcohol/week: 0.0 standard drinks  . Drug use: No  . Sexual activity: Yes  Lifestyle  . Physical activity:    Days per week: Not on file    Minutes per session: Not on file  . Stress: Not on file  Relationships  . Social connections:    Talks on phone: Not on file    Gets together: Not on file    Attends religious service: Not on file    Active member of club or organization: Not on file    Attends meetings of clubs or organizations: Not on file    Relationship status: Not on file  . Intimate partner violence:    Fear of current or ex partner: Not on file    Emotionally abused: Not on file    Physically abused: Not on file    Forced sexual activity: Not on file  Other Topics Concern  . Not on file  Social History Narrative   Pt lives in 2 story home with his wife and 1 son   Has masters degree in psychology   Currently disabled.        REVIEW OF SYSTEMS: Constitutional: No fevers, chills, or sweats, no generalized fatigue, change in appetite Eyes: No visual changes, double vision, eye pain Ear, nose and throat: No hearing loss, ear pain, nasal congestion, sore throat Cardiovascular: No chest pain, palpitations Respiratory:  No shortness of breath at rest or with exertion, wheezes GastrointestinaI: No nausea, vomiting, diarrhea, abdominal pain, fecal incontinence Genitourinary:  No dysuria, urinary retention or frequency Musculoskeletal:  No neck pain, back pain Integumentary: No rash, pruritus, skin lesions Neurological: as above Psychiatric: No depression, insomnia, anxiety Endocrine: No palpitations, fatigue, diaphoresis, mood swings, change in appetite, change in weight, increased thirst Hematologic/Lymphatic:  No anemia, purpura,  petechiae. Allergic/Immunologic: no itchy/runny eyes, nasal congestion, recent allergic reactions, rashes  PHYSICAL EXAM: Vitals:   04/29/18 0825  BP: (!) 142/76  Pulse: 64  SpO2: 96%   General: No acute distress Head:  Normocephalic/atraumatic Neck: supple, no paraspinal tenderness, full range of motion Heart:  Regular rate and rhythm Lungs:  Clear to auscultation bilaterally Back: No paraspinal tenderness Skin/Extremities: No rash, no  edema Neurological Exam:  Mental status: alert and oriented to person, place, and time, no dysarthria, Fund of knowledge is appropriate.  Recent and remote memory are intact.Attention and concentration are normal.  Cranial nerves: CN I: not tested CN II: pupils equal, round and reactive to light, right homonymous hemianopia (similar to prior) CN III, IV, VI:  full range of motion, no nystagmus, no ptosis CN V: facial sensation intact CN VII: right lower facial weakness CN VIII: hearing intact to finger rub CN IX, X: gag intact, uvula midline CN XI: sternocleidomastoid and trapezius muscles intact CN XII: tongue midline Bulk & Tone: normal, no fasciculations. Motor: 5/5 on left UE and LE, 5/5 right LE, spastic hemiparesis of right UE 5/5 proximally, 4/5 distally (similar to prior) Sensation: intact to light touch. Cerebellar: no incoordination on finger to nose with mild difficulty with right hand due to mild weakness Gait: narrow-based and steady, able to tandem walk adequately.  IMPRESSION: This is a pleasant 52 yo RH man with a history of  diabetes, sickle-cell thalassemia, DVT and PE off Coumadin due to GI bleed, sleep apnea, MGUS, CKD on dialysis, and strokes in multiple vascular distributions in January 2017 with subsequent seizures. He has residual mild right-sided weakness, and reported worsening right-sided weakness after seizures. His seizures were occurring soon after dialysis, suggestive of focal seizures with staring/unresponsiveness  and Todd's paralysis on the right afterwards. He has not had any further seizures since starting clonazepam 0.42m dose 1 hour prior to dialysis session last March 2019. He is taking Vimpat 1542mBID with additional dose after dialysis, and Lamotrigine 15066mID without side effects. He is interested in driving, we discussed [place] driving laws with regards to seizures, no driving until 6 months seizure-free, however with prior stroke, recommend driving evaluation with OT to determine safety before resuming driving. He will follow-up in 6 months and knows to call for any issues.   Thank you for allowing me to participate in his care.  Please do not hesitate to call for any questions or concerns.  The duration of this appointment visit was 26 minutes of face-to-face time with the patient.  Greater than 50% of this time was spent in counseling, explanation of diagnosis, planning of further management, and coordination of care.   KarEllouise Newer.D.   CC: Dr. SanBaird [facility]

## 2018-04-30 DIAGNOSIS — N186 End stage renal disease: Secondary | ICD-10-CM | POA: Diagnosis not present

## 2018-04-30 DIAGNOSIS — D689 Coagulation defect, unspecified: Secondary | ICD-10-CM | POA: Diagnosis not present

## 2018-04-30 DIAGNOSIS — N2581 Secondary hyperparathyroidism of renal origin: Secondary | ICD-10-CM | POA: Diagnosis not present

## 2018-04-30 DIAGNOSIS — D509 Iron deficiency anemia, unspecified: Secondary | ICD-10-CM | POA: Diagnosis not present

## 2018-04-30 DIAGNOSIS — E1129 Type 2 diabetes mellitus with other diabetic kidney complication: Secondary | ICD-10-CM | POA: Diagnosis not present

## 2018-05-03 DIAGNOSIS — N2581 Secondary hyperparathyroidism of renal origin: Secondary | ICD-10-CM | POA: Diagnosis not present

## 2018-05-03 DIAGNOSIS — N186 End stage renal disease: Secondary | ICD-10-CM | POA: Diagnosis not present

## 2018-05-03 DIAGNOSIS — D689 Coagulation defect, unspecified: Secondary | ICD-10-CM | POA: Diagnosis not present

## 2018-05-03 DIAGNOSIS — E1129 Type 2 diabetes mellitus with other diabetic kidney complication: Secondary | ICD-10-CM | POA: Diagnosis not present

## 2018-05-04 DIAGNOSIS — M47812 Spondylosis without myelopathy or radiculopathy, cervical region: Secondary | ICD-10-CM | POA: Diagnosis not present

## 2018-05-04 DIAGNOSIS — C9001 Multiple myeloma in remission: Secondary | ICD-10-CM | POA: Diagnosis not present

## 2018-05-04 DIAGNOSIS — M8580 Other specified disorders of bone density and structure, unspecified site: Secondary | ICD-10-CM | POA: Diagnosis not present

## 2018-05-04 DIAGNOSIS — D4989 Neoplasm of unspecified behavior of other specified sites: Secondary | ICD-10-CM | POA: Diagnosis not present

## 2018-05-04 DIAGNOSIS — C9000 Multiple myeloma not having achieved remission: Secondary | ICD-10-CM | POA: Diagnosis not present

## 2018-05-05 DIAGNOSIS — N186 End stage renal disease: Secondary | ICD-10-CM | POA: Diagnosis not present

## 2018-05-05 DIAGNOSIS — D689 Coagulation defect, unspecified: Secondary | ICD-10-CM | POA: Diagnosis not present

## 2018-05-05 DIAGNOSIS — N2581 Secondary hyperparathyroidism of renal origin: Secondary | ICD-10-CM | POA: Diagnosis not present

## 2018-05-05 DIAGNOSIS — E1129 Type 2 diabetes mellitus with other diabetic kidney complication: Secondary | ICD-10-CM | POA: Diagnosis not present

## 2018-05-07 DIAGNOSIS — N186 End stage renal disease: Secondary | ICD-10-CM | POA: Diagnosis not present

## 2018-05-07 DIAGNOSIS — N2581 Secondary hyperparathyroidism of renal origin: Secondary | ICD-10-CM | POA: Diagnosis not present

## 2018-05-07 DIAGNOSIS — E1129 Type 2 diabetes mellitus with other diabetic kidney complication: Secondary | ICD-10-CM | POA: Diagnosis not present

## 2018-05-07 DIAGNOSIS — D689 Coagulation defect, unspecified: Secondary | ICD-10-CM | POA: Diagnosis not present

## 2018-05-08 DIAGNOSIS — Z992 Dependence on renal dialysis: Secondary | ICD-10-CM | POA: Diagnosis not present

## 2018-05-08 DIAGNOSIS — E1122 Type 2 diabetes mellitus with diabetic chronic kidney disease: Secondary | ICD-10-CM | POA: Diagnosis not present

## 2018-05-08 DIAGNOSIS — N186 End stage renal disease: Secondary | ICD-10-CM | POA: Diagnosis not present

## 2018-05-10 DIAGNOSIS — D509 Iron deficiency anemia, unspecified: Secondary | ICD-10-CM | POA: Diagnosis not present

## 2018-05-10 DIAGNOSIS — N2581 Secondary hyperparathyroidism of renal origin: Secondary | ICD-10-CM | POA: Diagnosis not present

## 2018-05-10 DIAGNOSIS — D689 Coagulation defect, unspecified: Secondary | ICD-10-CM | POA: Diagnosis not present

## 2018-05-10 DIAGNOSIS — N186 End stage renal disease: Secondary | ICD-10-CM | POA: Diagnosis not present

## 2018-05-10 DIAGNOSIS — E1129 Type 2 diabetes mellitus with other diabetic kidney complication: Secondary | ICD-10-CM | POA: Diagnosis not present

## 2018-05-11 DIAGNOSIS — C9000 Multiple myeloma not having achieved remission: Secondary | ICD-10-CM | POA: Diagnosis not present

## 2018-05-12 DIAGNOSIS — N2581 Secondary hyperparathyroidism of renal origin: Secondary | ICD-10-CM | POA: Diagnosis not present

## 2018-05-12 DIAGNOSIS — N186 End stage renal disease: Secondary | ICD-10-CM | POA: Diagnosis not present

## 2018-05-12 DIAGNOSIS — D689 Coagulation defect, unspecified: Secondary | ICD-10-CM | POA: Diagnosis not present

## 2018-05-12 DIAGNOSIS — D509 Iron deficiency anemia, unspecified: Secondary | ICD-10-CM | POA: Diagnosis not present

## 2018-05-12 DIAGNOSIS — E1129 Type 2 diabetes mellitus with other diabetic kidney complication: Secondary | ICD-10-CM | POA: Diagnosis not present

## 2018-05-13 ENCOUNTER — Telehealth: Payer: Self-pay | Admitting: Oncology

## 2018-05-13 DIAGNOSIS — C9000 Multiple myeloma not having achieved remission: Secondary | ICD-10-CM | POA: Diagnosis not present

## 2018-05-13 NOTE — Telephone Encounter (Signed)
Tried to call regarding 9/10 I did laeve a message

## 2018-05-14 ENCOUNTER — Telehealth: Payer: Self-pay

## 2018-05-14 DIAGNOSIS — E1129 Type 2 diabetes mellitus with other diabetic kidney complication: Secondary | ICD-10-CM | POA: Diagnosis not present

## 2018-05-14 DIAGNOSIS — N186 End stage renal disease: Secondary | ICD-10-CM | POA: Diagnosis not present

## 2018-05-14 DIAGNOSIS — D509 Iron deficiency anemia, unspecified: Secondary | ICD-10-CM | POA: Diagnosis not present

## 2018-05-14 DIAGNOSIS — N2581 Secondary hyperparathyroidism of renal origin: Secondary | ICD-10-CM | POA: Diagnosis not present

## 2018-05-14 DIAGNOSIS — D689 Coagulation defect, unspecified: Secondary | ICD-10-CM | POA: Diagnosis not present

## 2018-05-14 NOTE — Telephone Encounter (Signed)
Spoke with patient wife concerning appointment change. Per 9/6 phone msg return called. [name] agreed to see the patient on Monday the 9th @ 8:30

## 2018-05-17 ENCOUNTER — Inpatient Hospital Stay: Payer: 59 | Attending: Oncology | Admitting: Oncology

## 2018-05-17 ENCOUNTER — Telehealth: Payer: Self-pay | Admitting: Oncology

## 2018-05-17 VITALS — BP 151/87 | HR 75 | Temp 98.6°F | Resp 18 | Ht 67.0 in | Wt 219.9 lb

## 2018-05-17 DIAGNOSIS — E1122 Type 2 diabetes mellitus with diabetic chronic kidney disease: Secondary | ICD-10-CM

## 2018-05-17 DIAGNOSIS — D472 Monoclonal gammopathy: Secondary | ICD-10-CM

## 2018-05-17 DIAGNOSIS — D509 Iron deficiency anemia, unspecified: Secondary | ICD-10-CM | POA: Diagnosis not present

## 2018-05-17 DIAGNOSIS — N189 Chronic kidney disease, unspecified: Secondary | ICD-10-CM | POA: Diagnosis not present

## 2018-05-17 DIAGNOSIS — D631 Anemia in chronic kidney disease: Secondary | ICD-10-CM

## 2018-05-17 DIAGNOSIS — Z992 Dependence on renal dialysis: Secondary | ICD-10-CM | POA: Diagnosis not present

## 2018-05-17 DIAGNOSIS — Z5112 Encounter for antineoplastic immunotherapy: Secondary | ICD-10-CM | POA: Diagnosis not present

## 2018-05-17 DIAGNOSIS — C9000 Multiple myeloma not having achieved remission: Secondary | ICD-10-CM

## 2018-05-17 DIAGNOSIS — N2581 Secondary hyperparathyroidism of renal origin: Secondary | ICD-10-CM | POA: Diagnosis not present

## 2018-05-17 DIAGNOSIS — Z7189 Other specified counseling: Secondary | ICD-10-CM

## 2018-05-17 DIAGNOSIS — Z86718 Personal history of other venous thrombosis and embolism: Secondary | ICD-10-CM | POA: Diagnosis not present

## 2018-05-17 DIAGNOSIS — I129 Hypertensive chronic kidney disease with stage 1 through stage 4 chronic kidney disease, or unspecified chronic kidney disease: Secondary | ICD-10-CM | POA: Diagnosis not present

## 2018-05-17 DIAGNOSIS — Z8673 Personal history of transient ischemic attack (TIA), and cerebral infarction without residual deficits: Secondary | ICD-10-CM

## 2018-05-17 DIAGNOSIS — N186 End stage renal disease: Secondary | ICD-10-CM | POA: Diagnosis not present

## 2018-05-17 DIAGNOSIS — D689 Coagulation defect, unspecified: Secondary | ICD-10-CM | POA: Diagnosis not present

## 2018-05-17 DIAGNOSIS — E1129 Type 2 diabetes mellitus with other diabetic kidney complication: Secondary | ICD-10-CM | POA: Diagnosis not present

## 2018-05-17 HISTORY — DX: Multiple myeloma not having achieved remission: C90.00

## 2018-05-17 MED ORDER — ACYCLOVIR 400 MG PO TABS: 400.0000 mg | ORAL_TABLET | Freq: Every day | ORAL | 0 refills | Status: DC

## 2018-05-17 MED ORDER — DEXAMETHASONE 4 MG PO TABS: ORAL_TABLET | ORAL | 3 refills | Status: DC

## 2018-05-17 MED ORDER — CYCLOPHOSPHAMIDE 50 MG PO CAPS: 50.0000 mg | ORAL_CAPSULE | Freq: Every day | ORAL | 3 refills | Status: DC

## 2018-05-17 MED ORDER — PROCHLORPERAZINE MALEATE 10 MG PO TABS: 10.0000 mg | ORAL_TABLET | Freq: Four times a day (QID) | ORAL | 0 refills | Status: DC | PRN

## 2018-05-17 NOTE — Progress Notes (Signed)
Hematology and Oncology Follow Up Visit  Lorain [id number] Dec 20, 1965 52 y.o. 05/17/2018 8:44 AM    Principle Diagnosis: 52 year old man with  1.  IgG kappa smoldering myeloma diagnosed in 2012.  He has close to 20% plasma cells without active symptoms.  2.  Renal failure: Unrelated to plasma cell disorder.  Interim History:  Isaiah [name] presents today for a follow-up.  Since her last visit, continues to be dialysis dependent although he is considering renal transplant.  He has been temporarily placed on transplant list and subsequently removed because of his plasma cell disorder.  He was evaluated at [facility] for this condition and recommended treatment and attempt to place him back on transplant list.  Clinically, he reports no major complaints at this time he remains asymptomatic from this disease.  He still ambulates slowly without any help of a walker or a cane.  He does not report any headaches, blurry vision, syncope.  Denied alteration mental status or confusion.  He does not report any fevers, chills, sweats or weight change. He does not report any  palpitation, orthopnea or leg edema. Does not report any cough or hemoptysis or hematemesis. He does not report any nausea, vomiting.  He denies any abdominal pain or early satiety.  He denies changes in his bowel habits.  He denies any hematochezia or melena. He does not report any skin rashes or lesions.  He denies any lymphadenopathy or petechiae.  He denies any changes in his mood.  Rest of his review of systems is negative.   Medications: I have reviewed the patient's current medications.  Current Outpatient Medications:  .  albuterol (PROAIR HFA) 108 (90 Base) MCG/ACT inhaler, Inhale two puffs every 4-6 hours if needed for cough or wheeze, Disp: 1 Inhaler, Rfl: 1 .  aspirin EC 81 MG tablet, Take 1 tablet (81 mg total) by mouth daily., Disp: 100 tablet, Rfl: 0 .  calcium acetate (PHOSLO) 667 MG capsule, Take  2,001 mg by mouth 3 (three) times daily with meals. , Disp: , Rfl:  .  Cetirizine HCl 10 MG CAPS, Take 1 capsule by mouth daily., Disp: , Rfl:  .  cinacalcet (SENSIPAR) 60 MG tablet, Take 60 mg by mouth every other day. , Disp: , Rfl:  .  clonazePAM (KLONOPIN) 0.5 MG tablet, Take 1 tablet prior to each dialysis session, Disp: 15 tablet, Rfl: 5 .  fluticasone furoate-vilanterol (BREO ELLIPTA) 100-25 MCG/INH AEPB, Inhale one puff once daily to prevent cough or wheeze, Disp: 60 each, Rfl: 5 .  insulin glargine (LANTUS) 100 unit/mL SOPN, Inject 8 Units into the skin at bedtime. , Disp: , Rfl:  .  Lacosamide (VIMPAT) 150 MG TABS, Take 1 tablet twice a day. Take an additional 1 tablet after dialysis session three times a week., Disp: 75 tablet, Rfl: 5 .  lamoTRIgine (LAMICTAL) 150 MG tablet, Take 1 tablet (150 mg total) by mouth 2 (two) times daily., Disp: 180 tablet, Rfl: 3 .  metoprolol tartrate (LOPRESSOR) 25 MG tablet, Take 0.5 tablets (12.5 mg total) by mouth 2 (two) times daily., Disp: 60 tablet, Rfl: 0 .  midodrine (PROAMATINE) 10 MG tablet, Take 10 mg by mouth 2 (two) times daily with a meal. , Disp: , Rfl:  .  multivitamin (RENA-VIT) TABS tablet, Take 1 tablet by mouth at bedtime., Disp: 30 tablet, Rfl: 1 .  pantoprazole (PROTONIX) 40 MG tablet, Take 1 tablet (40 mg total) by mouth 2 (two) times daily. (Patient taking  differently: Take 40 mg by mouth every morning. ), Disp: 60 tablet, Rfl: 1  Allergies:  Allergies  Allergen Reactions  . Codeine Rash    Unknown reaction (patient says it was more serious than just a rash, but he can't remember what happened)    Past Medical History, Surgical history, Social history, and Family History were reviewed and updated.  Physical Exam: Blood pressure (!) 151/87, pulse 75, temperature 98.6 F (37 C), temperature source Oral, resp. rate 18, height 5' 7"  (1.702 m), weight 219 lb 14.4 oz (99.7 kg), SpO2 97 %.   ECOG: 1   General appearance:  Comfortable appearing without any discomfort Head: Normocephalic without any trauma Oropharynx: Mucous membranes are moist and pink without any thrush or ulcers. Eyes: Pupils are equal and round reactive to light. Lymph nodes: No cervical, supraclavicular, inguinal or axillary lymphadenopathy.   Heart:regular rate and rhythm.  S1 and S2 without leg edema. Lung: Clear without any rhonchi or wheezes.  No dullness to percussion. Abdomin: Soft, nontender, nondistended with good bowel sounds.  No hepatosplenomegaly. Musculoskeletal: No joint deformity or effusion.  Full range of motion noted. Neurological: No deficits noted on motor, sensory and deep tendon reflex exam. Skin: No petechial rash or dryness.  Appeared moist.  Psychiatric: Mood and affect appeared appropriate.    Lab Results: Lab Results  Component Value Date   WBC 12.1 (H) 11/23/2017   HGB 10.0 (L) 11/23/2017   HCT 27.6 (L) 11/23/2017   MCV 82.4 11/23/2017   PLT 244 11/23/2017     Chemistry      Component Value Date/Time   NA 135 11/23/2017 1647   NA 138 07/03/2017 0813   K 3.4 (L) 11/23/2017 1647   K 4.2 07/03/2017 0813   CL 95 (L) 11/23/2017 1647   CO2 27 11/23/2017 1647   CO2 27 07/03/2017 0813   BUN 20 11/23/2017 1647   BUN 46.4 (H) 07/03/2017 0813   CREATININE 6.50 (H) 11/23/2017 1647   CREATININE 10.9 (HH) 07/03/2017 0813      Component Value Date/Time   CALCIUM 8.3 (L) 11/23/2017 1647   CALCIUM 9.3 07/03/2017 0813   ALKPHOS 100 10/29/2017 0840   ALKPHOS 77 07/03/2017 0813   AST 11 10/29/2017 0840   AST 16 07/03/2017 0813   ALT 8 10/29/2017 0840   ALT 14 07/03/2017 0813   BILITOT 0.9 10/29/2017 0840   BILITOT 0.93 07/03/2017 0813        Results for KAELOB, PERSKY (MRN [id number]) as of 05/17/2018 08:44  Ref. Range 07/03/2017 08:13 10/29/2017 08:40  M Protein SerPl Elph-Mcnc Latest Ref Range: Not Observed g/dL 3.4 (H) 3.3 (H)   Results for BUELL, PARCEL (MRN [id number]) as of 05/17/2018 08:44   Ref. Range 07/03/2017 08:13 10/29/2017 08:40  IgG (Immunoglobin G), Serum Latest Ref Range: 700 - 1,600 mg/dL 4,862 (H) 5,026 (H)        Impression and Plan:  52 year old man with  1.  IgG kappa smoldering myeloma that is a slowly evolving over the years.  Protein studies in February 2019 were reviewed again and showed rise in his IgG level although his M spike remained about the same.  His most recent bone marrow biopsy obtained at [name] was close to 25% plasma cell infiltration.  The natural course of this disease was reviewed.  His case was also discussed with Dr. Alvie Heidelberg as well as treatment options were reviewed.  Given his evolving plasma cell disorder and potential multiple  myeloma, it is reasonable to treat his condition and to achieve remission and potentially consider for renal transplant.  He is recommended active treatment utilizing cyclophosphamide, Velcade and dexamethasone as initial treatment for his multiple myeloma.  Complication associated with this therapy was reviewed today which include nausea, vomiting, myelosuppression, peripheral neuropathy and exacerbation of his diabetes.   After discussion today, he is willing to proceed after chemo education class.   2. Anemia: Due to chronic renal insufficiency and is currently receiving growth factor support with dialysis.  3. Renal failure: Hemodialysis dependent and felt and not related to plasma cell disorder.  4. Recurrent thrombosis: No recent thrombosis or bleeding episodes noted.  5.  Zoster prophylaxis: He will start on lower dose of acyclovir.  6.  Diabetes: We will continue to monitor his blood sugar on dexamethasone.  He will receive 20 mg of dexamethasone weekly instead of 40 because of them.  7.  Nausea prophylaxis: He will prescribe Compazine for nausea prophylaxis.  8.  Goals of care: Plasma cell disorder is an incurable malignancy but disease that can be put into remission  hopefully to achieve renal transplant.  9. Follow-up: Will be in 1 week to start therapy.  25  minutes was spent with the patient face-to-face today.  More than 50% of time was dedicated to discussing the natural course of this disease, treatment options and complications related to this therapy.     Zola Button, MD 9/9/20198:44 AM

## 2018-05-17 NOTE — Telephone Encounter (Signed)
Scheduled appt per 9/9 los - gave patient AVS and calender

## 2018-05-17 NOTE — Progress Notes (Signed)
START ON PATHWAY REGIMEN - Multiple Myeloma and Other Plasma Cell Dyscrasias     A cycle is every 21 days:     Bortezomib      Cyclophosphamide      Dexamethasone   **Always confirm dose/schedule in your pharmacy ordering system**  Patient Characteristics: Newly Diagnosed, Transplant Eligible, Standard Risk R-ISS Staging: II Disease Classification: Newly Diagnosed Is Patient Eligible for Transplant<= Transplant Eligible Risk Status: Standard Risk Intent of Therapy: Non-Curative / Palliative Intent, Discussed with Patient

## 2018-05-18 ENCOUNTER — Ambulatory Visit: Payer: 59 | Admitting: Oncology

## 2018-05-18 ENCOUNTER — Telehealth: Payer: Self-pay | Admitting: Pharmacist

## 2018-05-18 DIAGNOSIS — C9000 Multiple myeloma not having achieved remission: Secondary | ICD-10-CM

## 2018-05-18 MED ORDER — CYCLOPHOSPHAMIDE 50 MG PO CAPS: ORAL_CAPSULE | ORAL | 3 refills | Status: DC

## 2018-05-18 MED FILL — CYCLOPHOSPHAMIDE 50 MG CAP: 50 | 28 days supply | Qty: 52 | Fill #0

## 2018-05-18 NOTE — Telephone Encounter (Signed)
Oral Chemotherapy Pharmacist Encounter   I spoke with patient's wife, [name], for overview of: cyclophosphamide (cyclophosphamide) for the treatment of multiple myeloma in conjunction with bortezomib and dexamethasone, planned duration 4-6 cycle then reassess.   Counseled on administration, dosing, side effects, monitoring, drug-food interactions, safe handling, storage, and disposal.  Patient is on intermittent hemodialysis M W F 11am-3:30pm  Once weekly regimen for treatment of multiple myeloma will occur on Tuesdays.  Patient will take cyclophosphamide 55m capsules, 13 capsules (6510m by mouth once weekly, to be given on the days of Velcade injection.  Patient will take cyclophosphamide without regard to food. Patient will take cyclophosphamide early in the day. He is not able to maintain hydration as he is fluid restricted.  Patient will take dexamethasone 4 mg tablets, 5 tablets (20 mg) by mouth once weekly, to be given on the days of Velcade injection. Patient will take his dexamethasone early in the morning and with food.  Cyclophosphamide start date: 05/25/18  Adverse effects include but are not limited to: decreased blood counts, nausea, vomiting, diarrhea, hair loss, and hemorrhagic cystitis.    We extensively discussed risk of hemorrhagic cystitis and bladder cancer with the buildup of 1 of cyclophosphamide's non-active metabolites, acrolein. They will alert the office for any signs of painful urination, pain in the bladder, or hematuria.  SySunday Spillersith questions surrounding dexamethasone toxicities of hyperglycemia, fluid retention, and weight gain. We discussed dose reduction of dexamethasone at initiation and benefit of dexamethasone as a component of treatment regimens for multiple myeloma.  Reviewed importance of keeping a medication schedule and plan for any missed doses.  We discussed acyclovir for herpes zoster reactivation, appropriate dose reduction due to  hemodialysis, and timing of administration in the evening as acyclovir is extremely dialyzable.  Mrs. [place] understanding and appreciation.   All questions answered. Medication reconciliation performed and medication/allergy list updated.  SySunday Spillersnformed that prescriptions for acyclovir, dexamethasone, and [facility]perazine [facility]. Cyclophosphamide prescription will be made ready for pickup at the [facility] Thursday, 05/20/2018, and they will pick it up when they are at the cancer center for chemotherapy education class. Copayment for cyclophosphamide is $0. SySunday Spillersnformed that the pharmacy will reach out to them 1 week prior to needing new fill of cyclophosphamide to help keep patient on track with this medication.  SySunday Spillersnows to call the office with questions or concerns. Oral Oncology Clinic will continue to follow.  JeJohny DrillingPharmD, BCPS, BCOP  05/18/2018 2:41 PM Oral Oncology Clinic [phone]

## 2018-05-18 NOTE — Telephone Encounter (Signed)
Oral Oncology Pharmacist Encounter  Received new prescription for Cytoxan (cyclophosphamide) for the treatment of multiple myeloma in conjunction with bortezomib and dexamethasone, planned duration 4-6 cycle then reassess.  Labs from Epic assessed, [place] for treatment. Noted patient on hemodialysis, cyclophosphamide and acyclovir will be dosed accordingly  Current medication list in Epic reviewed, no DDIs with cyclophosphamide identified.  Prescription has been e-scribed to the [facility] for benefits analysis and approval.  Oral Oncology Clinic will continue to follow for insurance authorization, copayment issues, initial counseling and start date.  Johny Drilling, PharmD, BCPS, BCOP  05/18/2018 9:35 AM Oral Oncology Clinic [phone]

## 2018-05-19 DIAGNOSIS — N2581 Secondary hyperparathyroidism of renal origin: Secondary | ICD-10-CM | POA: Diagnosis not present

## 2018-05-19 DIAGNOSIS — E1129 Type 2 diabetes mellitus with other diabetic kidney complication: Secondary | ICD-10-CM | POA: Diagnosis not present

## 2018-05-19 DIAGNOSIS — D509 Iron deficiency anemia, unspecified: Secondary | ICD-10-CM | POA: Diagnosis not present

## 2018-05-19 DIAGNOSIS — D689 Coagulation defect, unspecified: Secondary | ICD-10-CM | POA: Diagnosis not present

## 2018-05-19 DIAGNOSIS — N186 End stage renal disease: Secondary | ICD-10-CM | POA: Diagnosis not present

## 2018-05-20 ENCOUNTER — Ambulatory Visit: Payer: 59

## 2018-05-20 ENCOUNTER — Inpatient Hospital Stay: Payer: 59

## 2018-05-20 NOTE — Progress Notes (Signed)
FMLA successfully faxed to [employer] at [phone]. Mailed copy to patient address on file.

## 2018-05-21 DIAGNOSIS — D509 Iron deficiency anemia, unspecified: Secondary | ICD-10-CM | POA: Diagnosis not present

## 2018-05-21 DIAGNOSIS — N186 End stage renal disease: Secondary | ICD-10-CM | POA: Diagnosis not present

## 2018-05-21 DIAGNOSIS — E1129 Type 2 diabetes mellitus with other diabetic kidney complication: Secondary | ICD-10-CM | POA: Diagnosis not present

## 2018-05-21 DIAGNOSIS — D689 Coagulation defect, unspecified: Secondary | ICD-10-CM | POA: Diagnosis not present

## 2018-05-21 DIAGNOSIS — N2581 Secondary hyperparathyroidism of renal origin: Secondary | ICD-10-CM | POA: Diagnosis not present

## 2018-05-21 NOTE — Telephone Encounter (Signed)
Oral Oncology Patient Advocate Encounter  Confirmed with [place] that Cyclophospamide was picked up on 05/21/18   [place] Patient [place] Phone [phone] Fax [phone]

## 2018-05-24 DIAGNOSIS — E1129 Type 2 diabetes mellitus with other diabetic kidney complication: Secondary | ICD-10-CM | POA: Diagnosis not present

## 2018-05-24 DIAGNOSIS — D689 Coagulation defect, unspecified: Secondary | ICD-10-CM | POA: Diagnosis not present

## 2018-05-24 DIAGNOSIS — D631 Anemia in chronic kidney disease: Secondary | ICD-10-CM | POA: Diagnosis not present

## 2018-05-24 DIAGNOSIS — D509 Iron deficiency anemia, unspecified: Secondary | ICD-10-CM | POA: Diagnosis not present

## 2018-05-24 DIAGNOSIS — N2581 Secondary hyperparathyroidism of renal origin: Secondary | ICD-10-CM | POA: Diagnosis not present

## 2018-05-24 DIAGNOSIS — N186 End stage renal disease: Secondary | ICD-10-CM | POA: Diagnosis not present

## 2018-05-25 ENCOUNTER — Inpatient Hospital Stay: Payer: 59

## 2018-05-25 VITALS — BP 144/73 | HR 77 | Temp 98.1°F | Resp 18

## 2018-05-25 DIAGNOSIS — D631 Anemia in chronic kidney disease: Secondary | ICD-10-CM | POA: Diagnosis not present

## 2018-05-25 DIAGNOSIS — Z992 Dependence on renal dialysis: Secondary | ICD-10-CM | POA: Diagnosis not present

## 2018-05-25 DIAGNOSIS — I129 Hypertensive chronic kidney disease with stage 1 through stage 4 chronic kidney disease, or unspecified chronic kidney disease: Secondary | ICD-10-CM | POA: Diagnosis not present

## 2018-05-25 DIAGNOSIS — D472 Monoclonal gammopathy: Secondary | ICD-10-CM

## 2018-05-25 DIAGNOSIS — C9000 Multiple myeloma not having achieved remission: Secondary | ICD-10-CM

## 2018-05-25 DIAGNOSIS — Z86718 Personal history of other venous thrombosis and embolism: Secondary | ICD-10-CM | POA: Diagnosis not present

## 2018-05-25 DIAGNOSIS — Z8673 Personal history of transient ischemic attack (TIA), and cerebral infarction without residual deficits: Secondary | ICD-10-CM | POA: Diagnosis not present

## 2018-05-25 DIAGNOSIS — N189 Chronic kidney disease, unspecified: Secondary | ICD-10-CM | POA: Diagnosis not present

## 2018-05-25 DIAGNOSIS — R220 Localized swelling, mass and lump, head: Secondary | ICD-10-CM | POA: Diagnosis not present

## 2018-05-25 DIAGNOSIS — E1122 Type 2 diabetes mellitus with diabetic chronic kidney disease: Secondary | ICD-10-CM | POA: Diagnosis not present

## 2018-05-25 DIAGNOSIS — N63 Unspecified lump in unspecified breast: Secondary | ICD-10-CM | POA: Diagnosis not present

## 2018-05-25 DIAGNOSIS — Z5112 Encounter for antineoplastic immunotherapy: Secondary | ICD-10-CM | POA: Diagnosis not present

## 2018-05-25 DIAGNOSIS — R229 Localized swelling, mass and lump, unspecified: Secondary | ICD-10-CM | POA: Diagnosis not present

## 2018-05-25 LAB — CMP (CANCER CENTER ONLY)
ALT: 9 U/L (ref 0–44)
AST: 11 U/L — ABNORMAL LOW (ref 15–41)
Albumin: 3.8 g/dL (ref 3.5–5.0)
Alkaline Phosphatase: 70 U/L (ref 38–126)
Anion gap: 7 (ref 5–15)
BUN: 41 mg/dL — ABNORMAL HIGH (ref 6–20)
CO2: 30 mmol/L (ref 22–32)
Calcium: 9.7 mg/dL (ref 8.9–10.3)
Chloride: 98 mmol/L (ref 98–111)
Creatinine: 9.82 mg/dL (ref 0.61–1.24)
GFR, Est AFR Am: 6 mL/min — ABNORMAL LOW (ref >60)
GFR, Estimated: 5 mL/min — ABNORMAL LOW (ref >60)
Glucose, Bld: 220 mg/dL — ABNORMAL HIGH (ref 70–99)
Potassium: 4.8 mmol/L (ref 3.5–5.1)
Sodium: 135 mmol/L (ref 135–145)
Total Bilirubin: 1.1 mg/dL (ref 0.3–1.2)
Total Protein: 11.2 g/dL — ABNORMAL HIGH (ref 6.5–8.1)

## 2018-05-25 LAB — CBC WITH DIFFERENTIAL (CANCER CENTER ONLY)
Basophils Absolute: 0 10*3/uL (ref 0.0–0.1)
Basophils Relative: 0 %
Eosinophils Absolute: 0 10*3/uL (ref 0.0–0.5)
Eosinophils Relative: 0 %
HCT: 28.9 % — ABNORMAL LOW (ref 38.4–49.9)
Hemoglobin: 10.4 g/dL — ABNORMAL LOW (ref 13.0–17.1)
Lymphocytes Relative: 7 %
Lymphs Abs: 0.7 10*3/uL — ABNORMAL LOW (ref 0.9–3.3)
MCH: 30.5 pg (ref 27.2–33.4)
MCHC: 36 g/dL (ref 32.0–36.0)
MCV: 84.8 fL (ref 79.3–98.0)
Monocytes Absolute: 0.1 10*3/uL (ref 0.1–0.9)
Monocytes Relative: 1 %
Neutro Abs: 8.6 10*3/uL — ABNORMAL HIGH (ref 1.5–6.5)
Neutrophils Relative %: 92 %
Platelet Count: 279 10*3/uL (ref 140–400)
RBC: 3.41 MIL/uL — ABNORMAL LOW (ref 4.20–5.82)
RDW: 17.4 % — ABNORMAL HIGH (ref 11.0–14.6)
WBC Count: 9.4 10*3/uL (ref 4.0–10.3)
nRBC: 1 /100 WBC — ABNORMAL HIGH

## 2018-05-25 MED ORDER — PROCHLORPERAZINE MALEATE 10 MG PO TABS
10.0000 mg | ORAL_TABLET | Freq: Once | ORAL | Status: AC
  Administered 2018-05-25: 10 mg via ORAL

## 2018-05-25 MED ORDER — BORTEZOMIB CHEMO SQ INJECTION 3.5 MG (2.5MG/ML)
1.5000 mg/m2 | Freq: Once | INTRAMUSCULAR | Status: AC
  Administered 2018-05-25: 3.25 mg via SUBCUTANEOUS
  Filled 2018-05-25: qty 1.3

## 2018-05-25 MED ORDER — PROCHLORPERAZINE MALEATE 10 MG PO TABS
ORAL_TABLET | ORAL | Status: AC
  Filled 2018-05-25: qty 1

## 2018-05-25 NOTE — Progress Notes (Signed)
Dr. Alen Blew reviewed labs ok to treat despite results today.

## 2018-05-25 NOTE — Patient Instructions (Signed)
[facility] Discharge Instructions for Patients Receiving Chemotherapy  Today you received the following chemotherapy agents Velcade  To help prevent nausea and vomiting after your treatment, we encourage you to take your nausea medication as prescribed.   If you develop nausea and vomiting that is not controlled by your nausea medication, call the clinic.   BELOW ARE SYMPTOMS THAT SHOULD BE REPORTED IMMEDIATELY:  *FEVER GREATER THAN 100.5 F  *CHILLS WITH OR WITHOUT FEVER  NAUSEA AND VOMITING THAT IS NOT CONTROLLED WITH YOUR NAUSEA MEDICATION  *UNUSUAL SHORTNESS OF BREATH  *UNUSUAL BRUISING OR BLEEDING  TENDERNESS IN MOUTH AND THROAT WITH OR WITHOUT PRESENCE OF ULCERS  *URINARY PROBLEMS  *BOWEL PROBLEMS  UNUSUAL RASH Items with * indicate a potential emergency and should be followed up as soon as possible.  Feel free to call the clinic should you have any questions or concerns. The clinic phone number is [phone].  Please show the [place] at check-in to the Emergency Department and triage nurse.  (Velcade) Bortezomib injection What is this medicine? BORTEZOMIB (bor TEZ oh mib) is a medicine that targets proteins in cancer cells and stops the cancer cells from growing. It is used to treat multiple myeloma and mantle-cell lymphoma. This medicine may be used for other purposes; ask your health care provider or pharmacist if you have questions. COMMON BRAND NAME(S): Velcade What should I tell my health care provider before I take this medicine? They need to know if you have any of these conditions: -diabetes -heart disease -irregular heartbeat -liver disease -on hemodialysis -low blood counts, like low white blood cells, platelets, or hemoglobin -peripheral neuropathy -taking medicine for blood pressure -an unusual or allergic reaction to bortezomib, mannitol, boron, other medicines, foods, dyes, or preservatives -pregnant or trying to get  pregnant -breast-feeding How should I use this medicine? This medicine is for injection into a vein or for injection under the skin. It is given by a health care professional in a hospital or clinic setting. Talk to your pediatrician regarding the use of this medicine in children. Special care may be needed. Overdosage: If you think you have taken too much of this medicine contact a poison control center or emergency room at once. NOTE: This medicine is only for you. Do not share this medicine with others. What if I miss a dose? It is important not to miss your dose. Call your doctor or health care professional if you are unable to keep an appointment. What may interact with this medicine? This medicine may interact with the following medications: -ketoconazole -rifampin -ritonavir -St. John's Wort This list may not describe all possible interactions. Give your health care provider a list of all the medicines, herbs, non-prescription drugs, or dietary supplements you use. Also tell them if you smoke, drink alcohol, or use illegal drugs. Some items may interact with your medicine. What should I watch for while using this medicine? You may get drowsy or dizzy. Do not drive, use machinery, or do anything that needs mental alertness until you know how this medicine affects you. Do not stand or sit up quickly, especially if you are an older patient. This reduces the risk of dizzy or fainting spells. In some cases, you may be given additional medicines to help with side effects. Follow all directions for their use. Call your doctor or health care professional for advice if you get a fever, chills or sore throat, or other symptoms of a cold or flu. Do not  treat yourself. This drug decreases your body's ability to fight infections. Try to avoid being around people who are sick. This medicine may increase your risk to bruise or bleed. Call your doctor or health care professional if you notice any unusual  bleeding. You may need blood work done while you are taking this medicine. In some patients, this medicine may cause a serious brain infection that may cause death. If you have any problems seeing, thinking, speaking, walking, or standing, tell your doctor right away. If you cannot reach your doctor, urgently seek other source of medical care. Check with your doctor or health care professional if you get an attack of severe diarrhea, nausea and vomiting, or if you sweat a lot. The loss of too much body fluid can make it dangerous for you to take this medicine. Do not become pregnant while taking this medicine or for at least 2 months after stopping it. Women should inform their doctor if they wish to become pregnant or think they might be pregnant. Men should not father a child while taking this medicine and for at least 2 months after stopping it. There is a potential for serious side effects to an unborn child. Talk to your health care professional or pharmacist for more information. Do not breast-feed an infant while taking this medicine or for 2 months after stopping it. This medicine may interfere with the ability to have a child. You should talk with your doctor or health care professional if you are concerned about your fertility. What side effects may I notice from receiving this medicine? Side effects that you should report to your doctor or health care professional as soon as possible: -allergic reactions like skin rash, itching or hives, swelling of the face, lips, or tongue -breathing problems -changes in hearing -changes in vision -fast, irregular heartbeat -feeling faint or lightheaded, falls -pain, tingling, numbness in the hands or feet -right upper belly pain -seizures -swelling of the ankles, feet, hands -unusual bleeding or bruising -unusually weak or tired -vomiting -yellowing of the eyes or skin Side effects that usually do not require medical attention (report to your  doctor or health care professional if they continue or are bothersome): -changes in emotions or moods -constipation -diarrhea -loss of appetite -headache -irritation at site where injected -nausea This list may not describe all possible side effects. Call your doctor for medical advice about side effects. You may report side effects to FDA at 1-800-FDA-1088. Where should I keep my medicine? This drug is given in a hospital or clinic and will not be stored at home. NOTE: This sheet is a summary. It may not cover all possible information. If you have questions about this medicine, talk to your doctor, pharmacist, or health care provider.  2018 Elsevier/Gold Standard (2016-07-24 15:53:51)

## 2018-05-26 DIAGNOSIS — D509 Iron deficiency anemia, unspecified: Secondary | ICD-10-CM | POA: Diagnosis not present

## 2018-05-26 DIAGNOSIS — N2581 Secondary hyperparathyroidism of renal origin: Secondary | ICD-10-CM | POA: Diagnosis not present

## 2018-05-26 DIAGNOSIS — E1129 Type 2 diabetes mellitus with other diabetic kidney complication: Secondary | ICD-10-CM | POA: Diagnosis not present

## 2018-05-26 DIAGNOSIS — N186 End stage renal disease: Secondary | ICD-10-CM | POA: Diagnosis not present

## 2018-05-26 DIAGNOSIS — D689 Coagulation defect, unspecified: Secondary | ICD-10-CM | POA: Diagnosis not present

## 2018-05-26 DIAGNOSIS — D631 Anemia in chronic kidney disease: Secondary | ICD-10-CM | POA: Diagnosis not present

## 2018-05-26 LAB — MULTIPLE MYELOMA PANEL, SERUM
Albumin SerPl Elph-Mcnc: 4.3 g/dL (ref 2.9–4.4)
Albumin/Glob SerPl: 0.8 (ref 0.7–1.7)
Alpha 1: 0.3 g/dL (ref 0.0–0.4)
Alpha2 Glob SerPl Elph-Mcnc: 0.5 g/dL (ref 0.4–1.0)
B-Globulin SerPl Elph-Mcnc: 0.7 g/dL (ref 0.7–1.3)
Gamma Glob SerPl Elph-Mcnc: 4.5 g/dL — ABNORMAL HIGH (ref 0.4–1.8)
Globulin, Total: 6 g/dL — ABNORMAL HIGH (ref 2.2–3.9)
IgA: 61 mg/dL — ABNORMAL LOW (ref 90–386)
IgG (Immunoglobin G), Serum: 5679 mg/dL — ABNORMAL HIGH (ref 700–1600)
IgM (Immunoglobulin M), Srm: 26 mg/dL (ref 20–172)
M Protein SerPl Elph-Mcnc: 4 g/dL — ABNORMAL HIGH
Total Protein ELP: 10.3 g/dL — ABNORMAL HIGH (ref 6.0–8.5)

## 2018-05-26 LAB — KAPPA/LAMBDA LIGHT CHAINS
Kappa free light chain: 959.4 mg/L — ABNORMAL HIGH (ref 3.3–19.4)
Kappa, lambda light chain ratio: 11.77 — ABNORMAL HIGH (ref 0.26–1.65)
Lambda free light chains: 81.5 mg/L — ABNORMAL HIGH (ref 5.7–26.3)

## 2018-05-28 DIAGNOSIS — N2581 Secondary hyperparathyroidism of renal origin: Secondary | ICD-10-CM | POA: Diagnosis not present

## 2018-05-28 DIAGNOSIS — E1129 Type 2 diabetes mellitus with other diabetic kidney complication: Secondary | ICD-10-CM | POA: Diagnosis not present

## 2018-05-28 DIAGNOSIS — D689 Coagulation defect, unspecified: Secondary | ICD-10-CM | POA: Diagnosis not present

## 2018-05-28 DIAGNOSIS — D631 Anemia in chronic kidney disease: Secondary | ICD-10-CM | POA: Diagnosis not present

## 2018-05-28 DIAGNOSIS — N186 End stage renal disease: Secondary | ICD-10-CM | POA: Diagnosis not present

## 2018-05-28 DIAGNOSIS — D509 Iron deficiency anemia, unspecified: Secondary | ICD-10-CM | POA: Diagnosis not present

## 2018-05-31 DIAGNOSIS — N186 End stage renal disease: Secondary | ICD-10-CM | POA: Diagnosis not present

## 2018-05-31 DIAGNOSIS — D509 Iron deficiency anemia, unspecified: Secondary | ICD-10-CM | POA: Diagnosis not present

## 2018-05-31 DIAGNOSIS — N2581 Secondary hyperparathyroidism of renal origin: Secondary | ICD-10-CM | POA: Diagnosis not present

## 2018-05-31 DIAGNOSIS — D689 Coagulation defect, unspecified: Secondary | ICD-10-CM | POA: Diagnosis not present

## 2018-05-31 DIAGNOSIS — E1129 Type 2 diabetes mellitus with other diabetic kidney complication: Secondary | ICD-10-CM | POA: Diagnosis not present

## 2018-06-01 ENCOUNTER — Inpatient Hospital Stay: Payer: 59

## 2018-06-01 VITALS — BP 153/86 | HR 88 | Temp 98.0°F | Resp 14

## 2018-06-01 DIAGNOSIS — Z5112 Encounter for antineoplastic immunotherapy: Secondary | ICD-10-CM | POA: Diagnosis not present

## 2018-06-01 DIAGNOSIS — C9000 Multiple myeloma not having achieved remission: Secondary | ICD-10-CM

## 2018-06-01 DIAGNOSIS — Z86718 Personal history of other venous thrombosis and embolism: Secondary | ICD-10-CM | POA: Diagnosis not present

## 2018-06-01 DIAGNOSIS — D631 Anemia in chronic kidney disease: Secondary | ICD-10-CM | POA: Diagnosis not present

## 2018-06-01 DIAGNOSIS — I129 Hypertensive chronic kidney disease with stage 1 through stage 4 chronic kidney disease, or unspecified chronic kidney disease: Secondary | ICD-10-CM | POA: Diagnosis not present

## 2018-06-01 DIAGNOSIS — Z8673 Personal history of transient ischemic attack (TIA), and cerebral infarction without residual deficits: Secondary | ICD-10-CM | POA: Diagnosis not present

## 2018-06-01 DIAGNOSIS — Z992 Dependence on renal dialysis: Secondary | ICD-10-CM | POA: Diagnosis not present

## 2018-06-01 DIAGNOSIS — E1122 Type 2 diabetes mellitus with diabetic chronic kidney disease: Secondary | ICD-10-CM | POA: Diagnosis not present

## 2018-06-01 DIAGNOSIS — D472 Monoclonal gammopathy: Secondary | ICD-10-CM

## 2018-06-01 DIAGNOSIS — N189 Chronic kidney disease, unspecified: Secondary | ICD-10-CM | POA: Diagnosis not present

## 2018-06-01 LAB — CBC WITH DIFFERENTIAL (CANCER CENTER ONLY)
Band Neutrophils: 0 %
Basophils Absolute: 0 10*3/uL (ref 0.0–0.1)
Basophils Relative: 0 %
Blasts: 0 %
Eosinophils Absolute: 0.1 10*3/uL (ref 0.0–0.5)
Eosinophils Relative: 1 %
HCT: 28.7 % — ABNORMAL LOW (ref 38.4–49.9)
Hemoglobin: 10.6 g/dL — ABNORMAL LOW (ref 13.0–17.1)
Lymphocytes Relative: 5 %
Lymphs Abs: 0.5 10*3/uL — ABNORMAL LOW (ref 0.9–3.3)
MCH: 31.3 pg (ref 27.2–33.4)
MCHC: 36.9 g/dL — ABNORMAL HIGH (ref 32.0–36.0)
MCV: 84.7 fL (ref 79.3–98.0)
Metamyelocytes Relative: 0 %
Monocytes Absolute: 0.1 10*3/uL (ref 0.1–0.9)
Monocytes Relative: 1 %
Myelocytes: 0 %
Neutro Abs: 8.9 10*3/uL — ABNORMAL HIGH (ref 1.5–6.5)
Neutrophils Relative %: 93 %
Other: 0 %
Platelet Count: 355 10*3/uL (ref 140–400)
Promyelocytes Relative: 0 %
RBC: 3.39 MIL/uL — ABNORMAL LOW (ref 4.20–5.82)
RDW: 18.3 % — ABNORMAL HIGH (ref 11.0–14.6)
WBC Count: 9.6 10*3/uL (ref 4.0–10.3)
nRBC: 0 /100 WBC

## 2018-06-01 LAB — CMP (CANCER CENTER ONLY)
ALT: 12 U/L (ref 0–44)
AST: 12 U/L — ABNORMAL LOW (ref 15–41)
Albumin: 4 g/dL (ref 3.5–5.0)
Alkaline Phosphatase: 74 U/L (ref 38–126)
Anion gap: 9 (ref 5–15)
BUN: 37 mg/dL — ABNORMAL HIGH (ref 6–20)
CO2: 32 mmol/L (ref 22–32)
Calcium: 10.1 mg/dL (ref 8.9–10.3)
Chloride: 93 mmol/L — ABNORMAL LOW (ref 98–111)
Creatinine: 10.02 mg/dL (ref 0.61–1.24)
GFR, Est AFR Am: 6 mL/min — ABNORMAL LOW (ref >60)
GFR, Estimated: 5 mL/min — ABNORMAL LOW (ref >60)
Glucose, Bld: 188 mg/dL — ABNORMAL HIGH (ref 70–99)
Potassium: 4.4 mmol/L (ref 3.5–5.1)
Sodium: 134 mmol/L — ABNORMAL LOW (ref 135–145)
Total Bilirubin: 1.2 mg/dL (ref 0.3–1.2)
Total Protein: 11.5 g/dL — ABNORMAL HIGH (ref 6.5–8.1)

## 2018-06-01 MED ORDER — BORTEZOMIB CHEMO SQ INJECTION 3.5 MG (2.5MG/ML)
1.5000 mg/m2 | Freq: Once | INTRAMUSCULAR | Status: AC
  Administered 2018-06-01: 3.25 mg via SUBCUTANEOUS
  Filled 2018-06-01: qty 1.3

## 2018-06-01 NOTE — Progress Notes (Signed)
Dr. Alen Blew okay to treat  Today with elevated Cr. Of 10.02. Pt took personal compazine 20m  After lab appt. Today at 12:25.

## 2018-06-01 NOTE — Patient Instructions (Signed)
[facility] Discharge Instructions for Patients Receiving Chemotherapy  Today you received the following chemotherapy agents Velcade  To help prevent nausea and vomiting after your treatment, we encourage you to take your nausea medication as prescribed.   If you develop nausea and vomiting that is not controlled by your nausea medication, call the clinic.   BELOW ARE SYMPTOMS THAT SHOULD BE REPORTED IMMEDIATELY:  *FEVER GREATER THAN 100.5 F  *CHILLS WITH OR WITHOUT FEVER  NAUSEA AND VOMITING THAT IS NOT CONTROLLED WITH YOUR NAUSEA MEDICATION  *UNUSUAL SHORTNESS OF BREATH  *UNUSUAL BRUISING OR BLEEDING  TENDERNESS IN MOUTH AND THROAT WITH OR WITHOUT PRESENCE OF ULCERS  *URINARY PROBLEMS  *BOWEL PROBLEMS  UNUSUAL RASH Items with * indicate a potential emergency and should be followed up as soon as possible.  Feel free to call the clinic should you have any questions or concerns. The clinic phone number is [phone].  Please show the CHEMO ALERT CARD at check-in to the Emergency Department and triage nurse.  (Velcade) Bortezomib injection What is this medicine? BORTEZOMIB (bor TEZ oh mib) is a medicine that targets proteins in cancer cells and stops the cancer cells from growing. It is used to treat multiple myeloma and mantle-cell lymphoma. This medicine may be used for other purposes; ask your health care provider or pharmacist if you have questions. COMMON BRAND NAME(S): Velcade What should I tell my health care provider before I take this medicine? They need to know if you have any of these conditions: -diabetes -heart disease -irregular heartbeat -liver disease -on hemodialysis -low blood counts, like low white blood cells, platelets, or hemoglobin -peripheral neuropathy -taking medicine for blood pressure -an unusual or allergic reaction to bortezomib, mannitol, boron, other medicines, foods, dyes, or preservatives -pregnant or trying to get  pregnant -breast-feeding How should I use this medicine? This medicine is for injection into a vein or for injection under the skin. It is given by a health care professional in a hospital or clinic setting. Talk to your pediatrician regarding the use of this medicine in children. Special care may be needed. Overdosage: If you think you have taken too much of this medicine contact a poison control center or emergency room at once. NOTE: This medicine is only for you. Do not share this medicine with others. What if I miss a dose? It is important not to miss your dose. Call your doctor or health care professional if you are unable to keep an appointment. What may interact with this medicine? This medicine may interact with the following medications: -ketoconazole -rifampin -ritonavir -St. John's Wort This list may not describe all possible interactions. Give your health care provider a list of all the medicines, herbs, non-prescription drugs, or dietary supplements you use. Also tell them if you smoke, drink alcohol, or use illegal drugs. Some items may interact with your medicine. What should I watch for while using this medicine? You may get drowsy or dizzy. Do not drive, use machinery, or do anything that needs mental alertness until you know how this medicine affects you. Do not stand or sit up quickly, especially if you are an older patient. This reduces the risk of dizzy or fainting spells. In some cases, you may be given additional medicines to help with side effects. Follow all directions for their use. Call your doctor or health care professional for advice if you get a fever, chills or sore throat, or other symptoms of a cold or flu. Do not   treat yourself. This drug decreases your body's ability to fight infections. Try to avoid being around people who are sick. This medicine may increase your risk to bruise or bleed. Call your doctor or health care professional if you notice any unusual  bleeding. You may need blood work done while you are taking this medicine. In some patients, this medicine may cause a serious brain infection that may cause death. If you have any problems seeing, thinking, speaking, walking, or standing, tell your doctor right away. If you cannot reach your doctor, urgently seek other source of medical care. Check with your doctor or health care professional if you get an attack of severe diarrhea, nausea and vomiting, or if you sweat a lot. The loss of too much body fluid can make it dangerous for you to take this medicine. Do not become pregnant while taking this medicine or for at least 2 months after stopping it. Women should inform their doctor if they wish to become pregnant or think they might be pregnant. Men should not father a child while taking this medicine and for at least 2 months after stopping it. There is a potential for serious side effects to an unborn child. Talk to your health care professional or pharmacist for more information. Do not breast-feed an infant while taking this medicine or for 2 months after stopping it. This medicine may interfere with the ability to have a child. You should talk with your doctor or health care professional if you are concerned about your fertility. What side effects may I notice from receiving this medicine? Side effects that you should report to your doctor or health care professional as soon as possible: -allergic reactions like skin rash, itching or hives, swelling of the face, lips, or tongue -breathing problems -changes in hearing -changes in vision -fast, irregular heartbeat -feeling faint or lightheaded, falls -pain, tingling, numbness in the hands or feet -right upper belly pain -seizures -swelling of the ankles, feet, hands -unusual bleeding or bruising -unusually weak or tired -vomiting -yellowing of the eyes or skin Side effects that usually do not require medical attention (report to your  doctor or health care professional if they continue or are bothersome): -changes in emotions or moods -constipation -diarrhea -loss of appetite -headache -irritation at site where injected -nausea This list may not describe all possible side effects. Call your doctor for medical advice about side effects. You may report side effects to FDA at 1-800-FDA-1088. Where should I keep my medicine? This drug is given in a hospital or clinic and will not be stored at home. NOTE: This sheet is a summary. It may not cover all possible information. If you have questions about this medicine, talk to your doctor, pharmacist, or health care provider.  2018 Elsevier/Gold Standard (2016-07-24 15:53:51)

## 2018-06-02 DIAGNOSIS — N186 End stage renal disease: Secondary | ICD-10-CM | POA: Diagnosis not present

## 2018-06-02 DIAGNOSIS — D689 Coagulation defect, unspecified: Secondary | ICD-10-CM | POA: Diagnosis not present

## 2018-06-02 DIAGNOSIS — D509 Iron deficiency anemia, unspecified: Secondary | ICD-10-CM | POA: Diagnosis not present

## 2018-06-02 DIAGNOSIS — E1129 Type 2 diabetes mellitus with other diabetic kidney complication: Secondary | ICD-10-CM | POA: Diagnosis not present

## 2018-06-02 DIAGNOSIS — N2581 Secondary hyperparathyroidism of renal origin: Secondary | ICD-10-CM | POA: Diagnosis not present

## 2018-06-02 LAB — MULTIPLE MYELOMA PANEL, SERUM
Albumin SerPl Elph-Mcnc: 4.1 g/dL (ref 2.9–4.4)
Albumin/Glob SerPl: 0.7 (ref 0.7–1.7)
Alpha 1: 0.3 g/dL (ref 0.0–0.4)
Alpha2 Glob SerPl Elph-Mcnc: 0.6 g/dL (ref 0.4–1.0)
B-Globulin SerPl Elph-Mcnc: 0.8 g/dL (ref 0.7–1.3)
Gamma Glob SerPl Elph-Mcnc: 4.4 g/dL — ABNORMAL HIGH (ref 0.4–1.8)
Globulin, Total: 6.1 g/dL — ABNORMAL HIGH (ref 2.2–3.9)
IgA: 60 mg/dL — ABNORMAL LOW (ref 90–386)
IgG (Immunoglobin G), Serum: 5329 mg/dL — ABNORMAL HIGH (ref 700–1600)
IgM (Immunoglobulin M), Srm: 27 mg/dL (ref 20–172)
M Protein SerPl Elph-Mcnc: 4 g/dL — ABNORMAL HIGH
Total Protein ELP: 10.2 g/dL — ABNORMAL HIGH (ref 6.0–8.5)

## 2018-06-02 LAB — KAPPA/LAMBDA LIGHT CHAINS
Kappa free light chain: 1077.7 mg/L — ABNORMAL HIGH (ref 3.3–19.4)
Kappa, lambda light chain ratio: 13.45 — ABNORMAL HIGH (ref 0.26–1.65)
Lambda free light chains: 80.1 mg/L — ABNORMAL HIGH (ref 5.7–26.3)

## 2018-06-03 ENCOUNTER — Other Ambulatory Visit: Payer: Self-pay

## 2018-06-04 DIAGNOSIS — E1129 Type 2 diabetes mellitus with other diabetic kidney complication: Secondary | ICD-10-CM | POA: Diagnosis not present

## 2018-06-04 DIAGNOSIS — N2581 Secondary hyperparathyroidism of renal origin: Secondary | ICD-10-CM | POA: Diagnosis not present

## 2018-06-04 DIAGNOSIS — N186 End stage renal disease: Secondary | ICD-10-CM | POA: Diagnosis not present

## 2018-06-04 DIAGNOSIS — D689 Coagulation defect, unspecified: Secondary | ICD-10-CM | POA: Diagnosis not present

## 2018-06-04 DIAGNOSIS — D509 Iron deficiency anemia, unspecified: Secondary | ICD-10-CM | POA: Diagnosis not present

## 2018-06-07 DIAGNOSIS — N186 End stage renal disease: Secondary | ICD-10-CM | POA: Diagnosis not present

## 2018-06-07 DIAGNOSIS — Z23 Encounter for immunization: Secondary | ICD-10-CM | POA: Diagnosis not present

## 2018-06-07 DIAGNOSIS — E1122 Type 2 diabetes mellitus with diabetic chronic kidney disease: Secondary | ICD-10-CM | POA: Diagnosis not present

## 2018-06-07 DIAGNOSIS — D689 Coagulation defect, unspecified: Secondary | ICD-10-CM | POA: Diagnosis not present

## 2018-06-07 DIAGNOSIS — Z992 Dependence on renal dialysis: Secondary | ICD-10-CM | POA: Diagnosis not present

## 2018-06-07 DIAGNOSIS — E1129 Type 2 diabetes mellitus with other diabetic kidney complication: Secondary | ICD-10-CM | POA: Diagnosis not present

## 2018-06-07 DIAGNOSIS — N2581 Secondary hyperparathyroidism of renal origin: Secondary | ICD-10-CM | POA: Diagnosis not present

## 2018-06-08 ENCOUNTER — Inpatient Hospital Stay: Payer: Medicare Other

## 2018-06-08 ENCOUNTER — Inpatient Hospital Stay: Payer: Medicare Other | Attending: Oncology | Admitting: Oncology

## 2018-06-08 ENCOUNTER — Telehealth: Payer: Self-pay | Admitting: Oncology

## 2018-06-08 VITALS — BP 137/79 | HR 84 | Temp 98.7°F | Resp 18 | Ht 67.0 in | Wt 214.4 lb

## 2018-06-08 DIAGNOSIS — Z5112 Encounter for antineoplastic immunotherapy: Secondary | ICD-10-CM | POA: Insufficient documentation

## 2018-06-08 DIAGNOSIS — Z992 Dependence on renal dialysis: Secondary | ICD-10-CM | POA: Diagnosis not present

## 2018-06-08 DIAGNOSIS — I129 Hypertensive chronic kidney disease with stage 1 through stage 4 chronic kidney disease, or unspecified chronic kidney disease: Secondary | ICD-10-CM | POA: Diagnosis not present

## 2018-06-08 DIAGNOSIS — C9000 Multiple myeloma not having achieved remission: Secondary | ICD-10-CM | POA: Diagnosis not present

## 2018-06-08 DIAGNOSIS — E1129 Type 2 diabetes mellitus with other diabetic kidney complication: Secondary | ICD-10-CM | POA: Diagnosis not present

## 2018-06-08 DIAGNOSIS — D472 Monoclonal gammopathy: Secondary | ICD-10-CM

## 2018-06-08 DIAGNOSIS — D509 Iron deficiency anemia, unspecified: Secondary | ICD-10-CM | POA: Diagnosis not present

## 2018-06-08 DIAGNOSIS — N189 Chronic kidney disease, unspecified: Secondary | ICD-10-CM | POA: Diagnosis not present

## 2018-06-08 DIAGNOSIS — E1122 Type 2 diabetes mellitus with diabetic chronic kidney disease: Secondary | ICD-10-CM | POA: Diagnosis not present

## 2018-06-08 DIAGNOSIS — D631 Anemia in chronic kidney disease: Secondary | ICD-10-CM

## 2018-06-08 DIAGNOSIS — N2581 Secondary hyperparathyroidism of renal origin: Secondary | ICD-10-CM | POA: Diagnosis not present

## 2018-06-08 DIAGNOSIS — N186 End stage renal disease: Secondary | ICD-10-CM | POA: Diagnosis not present

## 2018-06-08 LAB — CBC WITH DIFFERENTIAL (CANCER CENTER ONLY)
Basophils Absolute: 0 10*3/uL (ref 0.0–0.1)
Basophils Relative: 0 %
Eosinophils Absolute: 0 10*3/uL (ref 0.0–0.5)
Eosinophils Relative: 0 %
HCT: 26.3 % — ABNORMAL LOW (ref 38.4–49.9)
Hemoglobin: 9.6 g/dL — ABNORMAL LOW (ref 13.0–17.1)
Lymphocytes Relative: 0 %
Lymphs Abs: 0 10*3/uL — ABNORMAL LOW (ref 0.9–3.3)
MCH: 31.4 pg (ref 27.2–33.4)
MCHC: 36.5 g/dL — ABNORMAL HIGH (ref 32.0–36.0)
MCV: 85.9 fL (ref 79.3–98.0)
Monocytes Absolute: 0.1 10*3/uL (ref 0.1–0.9)
Monocytes Relative: 1 %
Neutro Abs: 8.6 10*3/uL — ABNORMAL HIGH (ref 1.5–6.5)
Neutrophils Relative %: 99 %
Platelet Count: 232 10*3/uL (ref 140–400)
RBC: 3.06 MIL/uL — ABNORMAL LOW (ref 4.20–5.82)
RDW: 19.8 % — ABNORMAL HIGH (ref 11.0–14.6)
WBC Count: 8.7 10*3/uL (ref 4.0–10.3)

## 2018-06-08 LAB — CMP (CANCER CENTER ONLY)
ALT: 14 U/L (ref 0–44)
AST: 14 U/L — ABNORMAL LOW (ref 15–41)
Albumin: 3.9 g/dL (ref 3.5–5.0)
Alkaline Phosphatase: 65 U/L (ref 38–126)
Anion gap: 9 (ref 5–15)
BUN: 40 mg/dL — ABNORMAL HIGH (ref 6–20)
CO2: 30 mmol/L (ref 22–32)
Calcium: 9.4 mg/dL (ref 8.9–10.3)
Chloride: 95 mmol/L — ABNORMAL LOW (ref 98–111)
Creatinine: 10.19 mg/dL (ref 0.61–1.24)
GFR, Est AFR Am: 6 mL/min — ABNORMAL LOW (ref >60)
GFR, Estimated: 5 mL/min — ABNORMAL LOW (ref >60)
Glucose, Bld: 219 mg/dL — ABNORMAL HIGH (ref 70–99)
Potassium: 4.7 mmol/L (ref 3.5–5.1)
Sodium: 134 mmol/L — ABNORMAL LOW (ref 135–145)
Total Bilirubin: 1.2 mg/dL (ref 0.3–1.2)
Total Protein: 10.9 g/dL — ABNORMAL HIGH (ref 6.5–8.1)

## 2018-06-08 MED ORDER — BORTEZOMIB CHEMO SQ INJECTION 3.5 MG (2.5MG/ML)
1.5000 mg/m2 | Freq: Once | INTRAMUSCULAR | Status: AC
  Administered 2018-06-08: 3.25 mg via SUBCUTANEOUS
  Filled 2018-06-08: qty 1.3

## 2018-06-08 MED ORDER — PROCHLORPERAZINE MALEATE 10 MG PO TABS: 10.0000 mg | ORAL_TABLET | Freq: Once | ORAL | Status: DC

## 2018-06-08 NOTE — Telephone Encounter (Signed)
Gave avs and calendar ° °

## 2018-06-08 NOTE — Progress Notes (Signed)
Hematology and Oncology Follow Up Visit  [place] [id number] 1966/04/06 52 y.o. 06/08/2018 1:35 PM    Principle Diagnosis: 52 year old man with  1.  IgG kappa multiple myeloma evolved from a smoldering myeloma diagnosed in 2012.  He has 20% plasma cell in the bone marrow and required active treatment in September 2019.  2.  Renal failure: Related to long-standing hypertension.  Under evaluation for renal transplant.  Current therapy: Velcade dexamethasone and cyclophosphamide started on 05/25/2018.  Velcade is given at 1.5 mg/m weekly, dexamethasone is 20 mg weekly and and Cytoxan is given at 650 mg total dose weekly.  Interim History:  Mr. Coger presents is here for a follow-up visit.  Since last visit, he started his myeloma therapy in anticipation to complete renal transplant in the future.  He started taking therapy and received 2 weeks of these treatments without any complications.  He denies any nausea, vomiting or peripheral neuropathy.  He denies any excessive fatigue or tiredness.  His performance status and quality of life remained reasonable.  His dialysis has not been affected by these treatments.  He has no difficulties obtaining or taking Cytoxan or dexamethasone.  He checks his blood sugar regularly as well.  He does not report any headaches, blurry vision, syncope.  Denied dizziness or confusion.  He does not report any fevers, chills, sweats or weight change. He does not report any  palpitation, orthopnea or leg edema. Does not report any cough or hemoptysis or hematemesis. He does not report any nausea, vomiting.  He denies any changes in his bowel habits.  He denies any hematochezia or melena. He does not report any skin rashes or lesions.  He denies any lymphadenopathy or petechiae.  Denies any bleeding or clotting tendency.  Denies any heat or cold intolerance.  Rest of his review of systems is negative.   Medications: I have reviewed the patient's current medications.   Current Outpatient Medications:  .  acyclovir (ZOVIRAX) 400 MG tablet, Take 1 tablet (400 mg total) by mouth daily., Disp: 90 tablet, Rfl: 0 .  albuterol (PROAIR HFA) 108 (90 Base) MCG/ACT inhaler, Inhale two puffs every 4-6 hours if needed for cough or wheeze, Disp: 1 Inhaler, Rfl: 1 .  aspirin EC 81 MG tablet, Take 1 tablet (81 mg total) by mouth daily., Disp: 100 tablet, Rfl: 0 .  calcium acetate (PHOSLO) 667 MG capsule, Take 2,001 mg by mouth 3 (three) times daily with meals. , Disp: , Rfl:  .  Cetirizine HCl 10 MG CAPS, Take 1 capsule by mouth daily., Disp: , Rfl:  .  cinacalcet (SENSIPAR) 60 MG tablet, Take 60 mg by mouth every other day. , Disp: , Rfl:  .  clonazePAM (KLONOPIN) 0.5 MG tablet, Take 1 tablet prior to each dialysis session, Disp: 15 tablet, Rfl: 5 .  cyclophosphamide (CYTOXAN) 50 MG capsule, Take 13 capsules (612m) by mouth once weekly, take early in the day, Disp: 52 capsule, Rfl: 3 .  dexamethasone (DECADRON) 4 MG tablet, Take 5 tablets weekly, Disp: 90 tablet, Rfl: 3 .  fluticasone furoate-vilanterol (BREO ELLIPTA) 100-25 MCG/INH AEPB, Inhale one puff once daily to prevent cough or wheeze, Disp: 60 each, Rfl: 5 .  insulin glargine (LANTUS) 100 unit/mL SOPN, Inject 8 Units into the skin at bedtime. , Disp: , Rfl:  .  Lacosamide (VIMPAT) 150 MG TABS, Take 1 tablet twice a day. Take an additional 1 tablet after dialysis session three times a week., Disp: 75 tablet, Rfl: 5 .  lamoTRIgine (LAMICTAL) 150 MG tablet, Take 1 tablet (150 mg total) by mouth 2 (two) times daily., Disp: 180 tablet, Rfl: 3 .  metoprolol tartrate (LOPRESSOR) 25 MG tablet, Take 0.5 tablets (12.5 mg total) by mouth 2 (two) times daily., Disp: 60 tablet, Rfl: 0 .  midodrine (PROAMATINE) 10 MG tablet, Take 10 mg by mouth 2 (two) times daily with a meal. , Disp: , Rfl:  .  multivitamin (RENA-VIT) TABS tablet, Take 1 tablet by mouth at bedtime., Disp: 30 tablet, Rfl: 1 .  pantoprazole (PROTONIX) 40 MG tablet,  Take 1 tablet (40 mg total) by mouth 2 (two) times daily. (Patient taking differently: Take 40 mg by mouth every morning. ), Disp: 60 tablet, Rfl: 1 .  prochlorperazine (COMPAZINE) 10 MG tablet, Take 1 tablet (10 mg total) by mouth every 6 (six) hours as needed for nausea or vomiting., Disp: 30 tablet, Rfl: 0  Allergies:  Allergies  Allergen Reactions  . Codeine Rash    Unknown reaction (patient says it was more serious than just a rash, but he can't remember what happened)    Past Medical History, Surgical history, Social history, and Family History were reviewed and updated.  Physical Exam:  Blood pressure 137/79, pulse 84, temperature 98.7 F (37.1 C), temperature source Oral, resp. rate 18, height _0  (1.702 m), weight 214 lb 6.4 oz (97.3 kg), SpO2 95 %.   ECOG: 1   General appearance: Alert, awake without any distress. Head: Atraumatic without abnormalities Oropharynx: Without any thrush or ulcers. Eyes: No scleral icterus. Lymph nodes: No lymphadenopathy noted in the cervical, supraclavicular, or axillary nodes Heart:regular rate and rhythm, without any murmurs or gallops.   Lung: Clear to auscultation without any rhonchi, wheezes or dullness to percussion. Abdomin: Soft, nontender without any shifting dullness or ascites. Musculoskeletal: No clubbing or cyanosis. Neurological: No motor or sensory deficits. Skin: No rashes or lesions. Psychiatric: Mood appears stable and appropriate.    Lab Results: Lab Results  Component Value Date   WBC 9.6 06/01/2018   HGB 10.6 (L) 06/01/2018   HCT 28.7 (L) 06/01/2018   MCV 84.7 06/01/2018   PLT 355 06/01/2018     Chemistry      Component Value Date/Time   NA 134 (L) 06/01/2018 1235   NA 138 07/03/2017 0813   K 4.4 06/01/2018 1235   K 4.2 07/03/2017 0813   CL 93 (L) 06/01/2018 1235   CO2 32 06/01/2018 1235   CO2 27 07/03/2017 0813   BUN 37 (H) 06/01/2018 1235   BUN 46.4 (H) 07/03/2017 0813   CREATININE 10.02 (HH)  06/01/2018 1235   CREATININE 10.9 (HH) 07/03/2017 0813      Component Value Date/Time   CALCIUM 10.1 06/01/2018 1235   CALCIUM 9.3 07/03/2017 0813   ALKPHOS 74 06/01/2018 1235   ALKPHOS 77 07/03/2017 0813   AST 12 (L) 06/01/2018 1235   AST 16 07/03/2017 0813   ALT 12 06/01/2018 1235   ALT 14 07/03/2017 0813   BILITOT 1.2 06/01/2018 1235   BILITOT 0.93 07/03/2017 0813         Results for HARLIS, CHAMPOUX (MRN [id number]) as of 06/08/2018 13:57  Ref. Range 06/01/2018 12:35  M Protein SerPl Elph-Mcnc Latest Ref Range: Not Observed g/dL 4.0 (H)  IFE 1 Unknown Comment  Globulin, Total Latest Ref Range: 2.2 - 3.9 g/dL 6.1 (H)  B-Globulin SerPl Elph-Mcnc Latest Ref Range: 0.7 - 1.3 g/dL 0.8  IgG (Immunoglobin G), Serum Latest Ref Range: 700 -  1,600 mg/dL 5,329 (H)  IgM (Immunoglobulin M), Srm Latest Ref Range: 20 - 172 mg/dL 27  IgA Latest Ref Range: 90 - 386 mg/dL 60 (L)        Impression and Plan:  52 year old man with  1.  IgG kappa multiple myeloma that evolved from smoldering myeloma.  He started active treatment in September 2019 in preparation for potential renal transplant in the future.  He completed 2 weeks of therapy utilizing Velcade, dexamethasone and Cytoxan without any major complications.  Risks and benefits of continuing therapy at this time was reviewed on long-term complications were reiterated.  At this time is agreeable to continue.   2. Anemia: Related to renal insufficiency and less likely plasma cell disorder.  Is currently receiving growth factor support.  3. Renal failure: Continues to receive hemodialysis at this time.  No complications noted since the start of therapy.  4.  Zoster prophylaxis: Currently on acyclovir for prophylaxis purposes.  5.  Diabetes: We need to continue to monitor his blood sugar on dexamethasone based therapy.  Blood sugar remains manageable.  6.  Nausea prophylaxis: Compazine is available to him at this time.  7.  Goals  of care: Treatment is mostly palliative although his disease can be moved into remission and long-term disease control is possible.  8. Follow-up: Weekly for systemic therapy in 4 weeks for MD follow-up.  25  minutes was spent with the patient face-to-face today.  More than 50% of time was dedicated to reviewing the natural course of his disease, complications related to treatment and managing these issues.     Zola Button, MD 10/1/20191:35 PM

## 2018-06-09 DIAGNOSIS — D689 Coagulation defect, unspecified: Secondary | ICD-10-CM | POA: Diagnosis not present

## 2018-06-09 DIAGNOSIS — D509 Iron deficiency anemia, unspecified: Secondary | ICD-10-CM | POA: Diagnosis not present

## 2018-06-09 DIAGNOSIS — N186 End stage renal disease: Secondary | ICD-10-CM | POA: Diagnosis not present

## 2018-06-09 DIAGNOSIS — D631 Anemia in chronic kidney disease: Secondary | ICD-10-CM | POA: Diagnosis not present

## 2018-06-09 DIAGNOSIS — N2581 Secondary hyperparathyroidism of renal origin: Secondary | ICD-10-CM | POA: Diagnosis not present

## 2018-06-09 DIAGNOSIS — E1129 Type 2 diabetes mellitus with other diabetic kidney complication: Secondary | ICD-10-CM | POA: Diagnosis not present

## 2018-06-10 ENCOUNTER — Other Ambulatory Visit: Payer: 59

## 2018-06-10 ENCOUNTER — Ambulatory Visit: Payer: 59

## 2018-06-10 ENCOUNTER — Ambulatory Visit: Payer: Self-pay | Admitting: Oncology

## 2018-06-11 DIAGNOSIS — D631 Anemia in chronic kidney disease: Secondary | ICD-10-CM | POA: Diagnosis not present

## 2018-06-11 DIAGNOSIS — N2581 Secondary hyperparathyroidism of renal origin: Secondary | ICD-10-CM | POA: Diagnosis not present

## 2018-06-11 DIAGNOSIS — N186 End stage renal disease: Secondary | ICD-10-CM | POA: Diagnosis not present

## 2018-06-11 DIAGNOSIS — D509 Iron deficiency anemia, unspecified: Secondary | ICD-10-CM | POA: Diagnosis not present

## 2018-06-11 DIAGNOSIS — E1129 Type 2 diabetes mellitus with other diabetic kidney complication: Secondary | ICD-10-CM | POA: Diagnosis not present

## 2018-06-14 DIAGNOSIS — E1129 Type 2 diabetes mellitus with other diabetic kidney complication: Secondary | ICD-10-CM | POA: Diagnosis not present

## 2018-06-14 DIAGNOSIS — D509 Iron deficiency anemia, unspecified: Secondary | ICD-10-CM | POA: Diagnosis not present

## 2018-06-14 DIAGNOSIS — N186 End stage renal disease: Secondary | ICD-10-CM | POA: Diagnosis not present

## 2018-06-14 DIAGNOSIS — D631 Anemia in chronic kidney disease: Secondary | ICD-10-CM | POA: Diagnosis not present

## 2018-06-14 DIAGNOSIS — E109 Type 1 diabetes mellitus without complications: Secondary | ICD-10-CM | POA: Diagnosis not present

## 2018-06-14 DIAGNOSIS — N2581 Secondary hyperparathyroidism of renal origin: Secondary | ICD-10-CM | POA: Diagnosis not present

## 2018-06-14 MED FILL — CYCLOPHOSPHAMIDE 50 MG CAP: 50 | 28 days supply | Qty: 52 | Fill #1

## 2018-06-15 ENCOUNTER — Inpatient Hospital Stay: Payer: Medicare Other

## 2018-06-15 ENCOUNTER — Other Ambulatory Visit: Payer: Self-pay | Admitting: Oncology

## 2018-06-15 VITALS — BP 144/79 | HR 80 | Temp 98.3°F | Resp 17

## 2018-06-15 DIAGNOSIS — D472 Monoclonal gammopathy: Secondary | ICD-10-CM

## 2018-06-15 DIAGNOSIS — C9000 Multiple myeloma not having achieved remission: Secondary | ICD-10-CM

## 2018-06-15 DIAGNOSIS — Z5112 Encounter for antineoplastic immunotherapy: Secondary | ICD-10-CM | POA: Diagnosis not present

## 2018-06-15 LAB — CBC WITH DIFFERENTIAL (CANCER CENTER ONLY)
Abs Immature Granulocytes: 0.03 10*3/uL (ref 0.00–0.07)
Basophils Absolute: 0 10*3/uL (ref 0.0–0.1)
Basophils Relative: 0 %
Eosinophils Absolute: 0 10*3/uL (ref 0.0–0.5)
Eosinophils Relative: 0 %
HCT: 25.1 % — ABNORMAL LOW (ref 39.0–52.0)
Hemoglobin: 9.4 g/dL — ABNORMAL LOW (ref 13.0–17.0)
Immature Granulocytes: 0 %
Lymphocytes Relative: 7 %
Lymphs Abs: 0.5 10*3/uL — ABNORMAL LOW (ref 0.7–4.0)
MCH: 32 pg (ref 26.0–34.0)
MCHC: 37.5 g/dL — ABNORMAL HIGH (ref 30.0–36.0)
MCV: 85.4 fL (ref 80.0–100.0)
Monocytes Absolute: 0.1 10*3/uL (ref 0.1–1.0)
Monocytes Relative: 2 %
Neutro Abs: 6.5 10*3/uL (ref 1.7–7.7)
Neutrophils Relative %: 91 %
Platelet Count: 333 10*3/uL (ref 150–400)
RBC: 2.94 MIL/uL — ABNORMAL LOW (ref 4.22–5.81)
RDW: 18.7 % — ABNORMAL HIGH (ref 11.5–15.5)
WBC Count: 7.1 10*3/uL (ref 4.0–10.5)
nRBC: 17.1 % — ABNORMAL HIGH (ref 0.0–0.2)

## 2018-06-15 LAB — CMP (CANCER CENTER ONLY)
ALT: 8 U/L (ref 0–44)
AST: 13 U/L — ABNORMAL LOW (ref 15–41)
Albumin: 3.8 g/dL (ref 3.5–5.0)
Alkaline Phosphatase: 67 U/L (ref 38–126)
Anion gap: 9 (ref 5–15)
BUN: 32 mg/dL — ABNORMAL HIGH (ref 6–20)
CO2: 31 mmol/L (ref 22–32)
Calcium: 9.8 mg/dL (ref 8.9–10.3)
Chloride: 94 mmol/L — ABNORMAL LOW (ref 98–111)
Creatinine: 9.09 mg/dL (ref 0.61–1.24)
GFR, Est AFR Am: 7 mL/min — ABNORMAL LOW (ref >60)
GFR, Estimated: 6 mL/min — ABNORMAL LOW (ref >60)
Glucose, Bld: 200 mg/dL — ABNORMAL HIGH (ref 70–99)
Potassium: 4.7 mmol/L (ref 3.5–5.1)
Sodium: 134 mmol/L — ABNORMAL LOW (ref 135–145)
Total Bilirubin: 1.4 mg/dL — ABNORMAL HIGH (ref 0.3–1.2)
Total Protein: 10.4 g/dL — ABNORMAL HIGH (ref 6.5–8.1)

## 2018-06-15 MED ORDER — PROCHLORPERAZINE MALEATE 10 MG PO TABS: 10.0000 mg | ORAL_TABLET | Freq: Once | ORAL | Status: DC

## 2018-06-15 MED ORDER — BORTEZOMIB CHEMO SQ INJECTION 3.5 MG (2.5MG/ML)
1.5000 mg/m2 | Freq: Once | INTRAMUSCULAR | Status: AC
  Administered 2018-06-15: 3.25 mg via SUBCUTANEOUS
  Filled 2018-06-15: qty 1.3

## 2018-06-15 NOTE — Progress Notes (Signed)
Okay to treat-Per Dr. Alen Blew it is okay to treat pt today with velcade and todays cbc/diff and cmet

## 2018-06-16 DIAGNOSIS — N186 End stage renal disease: Secondary | ICD-10-CM | POA: Diagnosis not present

## 2018-06-16 DIAGNOSIS — D631 Anemia in chronic kidney disease: Secondary | ICD-10-CM | POA: Diagnosis not present

## 2018-06-16 DIAGNOSIS — N2581 Secondary hyperparathyroidism of renal origin: Secondary | ICD-10-CM | POA: Diagnosis not present

## 2018-06-16 DIAGNOSIS — D509 Iron deficiency anemia, unspecified: Secondary | ICD-10-CM | POA: Diagnosis not present

## 2018-06-16 DIAGNOSIS — E1129 Type 2 diabetes mellitus with other diabetic kidney complication: Secondary | ICD-10-CM | POA: Diagnosis not present

## 2018-06-18 DIAGNOSIS — N2581 Secondary hyperparathyroidism of renal origin: Secondary | ICD-10-CM | POA: Diagnosis not present

## 2018-06-18 DIAGNOSIS — D631 Anemia in chronic kidney disease: Secondary | ICD-10-CM | POA: Diagnosis not present

## 2018-06-18 DIAGNOSIS — E1129 Type 2 diabetes mellitus with other diabetic kidney complication: Secondary | ICD-10-CM | POA: Diagnosis not present

## 2018-06-18 DIAGNOSIS — D509 Iron deficiency anemia, unspecified: Secondary | ICD-10-CM | POA: Diagnosis not present

## 2018-06-18 DIAGNOSIS — N186 End stage renal disease: Secondary | ICD-10-CM | POA: Diagnosis not present

## 2018-06-21 DIAGNOSIS — E1129 Type 2 diabetes mellitus with other diabetic kidney complication: Secondary | ICD-10-CM | POA: Diagnosis not present

## 2018-06-21 DIAGNOSIS — N186 End stage renal disease: Secondary | ICD-10-CM | POA: Diagnosis not present

## 2018-06-21 DIAGNOSIS — D631 Anemia in chronic kidney disease: Secondary | ICD-10-CM | POA: Diagnosis not present

## 2018-06-21 DIAGNOSIS — N2581 Secondary hyperparathyroidism of renal origin: Secondary | ICD-10-CM | POA: Diagnosis not present

## 2018-06-21 DIAGNOSIS — D509 Iron deficiency anemia, unspecified: Secondary | ICD-10-CM | POA: Diagnosis not present

## 2018-06-22 ENCOUNTER — Telehealth: Payer: Self-pay

## 2018-06-22 ENCOUNTER — Inpatient Hospital Stay: Payer: Medicare Other

## 2018-06-22 ENCOUNTER — Encounter: Payer: Self-pay | Admitting: Oncology

## 2018-06-22 VITALS — BP 126/72 | HR 86 | Temp 97.5°F | Resp 18

## 2018-06-22 DIAGNOSIS — C9000 Multiple myeloma not having achieved remission: Secondary | ICD-10-CM | POA: Diagnosis not present

## 2018-06-22 DIAGNOSIS — D472 Monoclonal gammopathy: Secondary | ICD-10-CM

## 2018-06-22 DIAGNOSIS — Z5112 Encounter for antineoplastic immunotherapy: Secondary | ICD-10-CM | POA: Diagnosis not present

## 2018-06-22 LAB — CMP (CANCER CENTER ONLY)
ALT: 9 U/L (ref 0–44)
AST: 14 U/L — ABNORMAL LOW (ref 15–41)
Albumin: 4 g/dL (ref 3.5–5.0)
Alkaline Phosphatase: 74 U/L (ref 38–126)
Anion gap: 11 (ref 5–15)
BUN: 32 mg/dL — ABNORMAL HIGH (ref 6–20)
CO2: 30 mmol/L (ref 22–32)
Calcium: 9.5 mg/dL (ref 8.9–10.3)
Chloride: 93 mmol/L — ABNORMAL LOW (ref 98–111)
Creatinine: 8.76 mg/dL (ref 0.61–1.24)
GFR, Est AFR Am: 7 mL/min — ABNORMAL LOW (ref >60)
GFR, Estimated: 6 mL/min — ABNORMAL LOW (ref >60)
Glucose, Bld: 185 mg/dL — ABNORMAL HIGH (ref 70–99)
Potassium: 4.3 mmol/L (ref 3.5–5.1)
Sodium: 134 mmol/L — ABNORMAL LOW (ref 135–145)
Total Bilirubin: 1.4 mg/dL — ABNORMAL HIGH (ref 0.3–1.2)
Total Protein: 10.4 g/dL — ABNORMAL HIGH (ref 6.5–8.1)

## 2018-06-22 LAB — CBC WITH DIFFERENTIAL (CANCER CENTER ONLY)
Abs Immature Granulocytes: 0.04 10*3/uL (ref 0.00–0.07)
Basophils Absolute: 0.1 10*3/uL (ref 0.0–0.1)
Basophils Relative: 1 %
Eosinophils Absolute: 0 10*3/uL (ref 0.0–0.5)
Eosinophils Relative: 0 %
HCT: 25.2 % — ABNORMAL LOW (ref 39.0–52.0)
Hemoglobin: 9.3 g/dL — ABNORMAL LOW (ref 13.0–17.0)
Immature Granulocytes: 0 %
Lymphocytes Relative: 7 %
Lymphs Abs: 0.6 10*3/uL — ABNORMAL LOW (ref 0.7–4.0)
MCH: 32.2 pg (ref 26.0–34.0)
MCHC: 36.9 g/dL — ABNORMAL HIGH (ref 30.0–36.0)
MCV: 87.2 fL (ref 80.0–100.0)
Monocytes Absolute: 0.2 10*3/uL (ref 0.1–1.0)
Monocytes Relative: 2 %
Neutro Abs: 8 10*3/uL — ABNORMAL HIGH (ref 1.7–7.7)
Neutrophils Relative %: 90 %
Platelet Count: 343 10*3/uL (ref 150–400)
RBC: 2.89 MIL/uL — ABNORMAL LOW (ref 4.22–5.81)
RDW: 19.2 % — ABNORMAL HIGH (ref 11.5–15.5)
WBC Count: 8.9 10*3/uL (ref 4.0–10.5)
nRBC: 28.7 % — ABNORMAL HIGH (ref 0.0–0.2)

## 2018-06-22 MED ORDER — BORTEZOMIB CHEMO SQ INJECTION 3.5 MG (2.5MG/ML)
1.5000 mg/m2 | Freq: Once | INTRAMUSCULAR | Status: AC
  Administered 2018-06-22: 3.25 mg via SUBCUTANEOUS
  Filled 2018-06-22: qty 1.3

## 2018-06-22 MED ORDER — PROCHLORPERAZINE MALEATE 10 MG PO TABS: 10.0000 mg | ORAL_TABLET | Freq: Once | ORAL | Status: DC

## 2018-06-22 NOTE — Progress Notes (Signed)
Went to meet with patient to introduce myself as Arboriculturist and to provide my card for any financial questions or concerns.  Advised patient of one-time $56 Engineer, drilling. Patient gave me the number to his spouse to contact regarding this. Advised him I will contact her. He verbalized understanding and has my card.  Called spouse(Isaiah [name]) and discussed grant and qualifications. She will email me income information.

## 2018-06-22 NOTE — Progress Notes (Signed)
Ok to treat with labs from today per Dr. Alen Blew. Patient took compazine at home around 12:30pm

## 2018-06-22 NOTE — Telephone Encounter (Signed)
Dr. Alen Blew made aware of critical creatnine of 8.76.

## 2018-06-22 NOTE — Patient Instructions (Signed)
[facility] Discharge Instructions for Patients Receiving Chemotherapy  Today you received the following chemotherapy agents Velcade  To help prevent nausea and vomiting after your treatment, we encourage you to take your nausea medication as prescribed.   If you develop nausea and vomiting that is not controlled by your nausea medication, call the clinic.   BELOW ARE SYMPTOMS THAT SHOULD BE REPORTED IMMEDIATELY:  *FEVER GREATER THAN 100.5 F  *CHILLS WITH OR WITHOUT FEVER  NAUSEA AND VOMITING THAT IS NOT CONTROLLED WITH YOUR NAUSEA MEDICATION  *UNUSUAL SHORTNESS OF BREATH  *UNUSUAL BRUISING OR BLEEDING  TENDERNESS IN MOUTH AND THROAT WITH OR WITHOUT PRESENCE OF ULCERS  *URINARY PROBLEMS  *BOWEL PROBLEMS  UNUSUAL RASH Items with * indicate a potential emergency and should be followed up as soon as possible.  Feel free to call the clinic should you have any questions or concerns. The clinic phone number is [phone].  Please show the CHEMO ALERT CARD at check-in to the Emergency Department and triage nurse.  (Velcade) Bortezomib injection What is this medicine? BORTEZOMIB (bor TEZ oh mib) is a medicine that targets proteins in cancer cells and stops the cancer cells from growing. It is used to treat multiple myeloma and mantle-cell lymphoma. This medicine may be used for other purposes; ask your health care provider or pharmacist if you have questions. COMMON BRAND NAME(S): Velcade What should I tell my health care provider before I take this medicine? They need to know if you have any of these conditions: -diabetes -heart disease -irregular heartbeat -liver disease -on hemodialysis -low blood counts, like low white blood cells, platelets, or hemoglobin -peripheral neuropathy -taking medicine for blood pressure -an unusual or allergic reaction to bortezomib, mannitol, boron, other medicines, foods, dyes, or preservatives -pregnant or trying to get  pregnant -breast-feeding How should I use this medicine? This medicine is for injection into a vein or for injection under the skin. It is given by a health care professional in a hospital or clinic setting. Talk to your pediatrician regarding the use of this medicine in children. Special care may be needed. Overdosage: If you think you have taken too much of this medicine contact a poison control center or emergency room at once. NOTE: This medicine is only for you. Do not share this medicine with others. What if I miss a dose? It is important not to miss your dose. Call your doctor or health care professional if you are unable to keep an appointment. What may interact with this medicine? This medicine may interact with the following medications: -ketoconazole -rifampin -ritonavir -St. John's Wort This list may not describe all possible interactions. Give your health care provider a list of all the medicines, herbs, non-prescription drugs, or dietary supplements you use. Also tell them if you smoke, drink alcohol, or use illegal drugs. Some items may interact with your medicine. What should I watch for while using this medicine? You may get drowsy or dizzy. Do not drive, use machinery, or do anything that needs mental alertness until you know how this medicine affects you. Do not stand or sit up quickly, especially if you are an older patient. This reduces the risk of dizzy or fainting spells. In some cases, you may be given additional medicines to help with side effects. Follow all directions for their use. Call your doctor or health care professional for advice if you get a fever, chills or sore throat, or other symptoms of a cold or flu. Do not   treat yourself. This drug decreases your body's ability to fight infections. Try to avoid being around people who are sick. This medicine may increase your risk to bruise or bleed. Call your doctor or health care professional if you notice any unusual  bleeding. You may need blood work done while you are taking this medicine. In some patients, this medicine may cause a serious brain infection that may cause death. If you have any problems seeing, thinking, speaking, walking, or standing, tell your doctor right away. If you cannot reach your doctor, urgently seek other source of medical care. Check with your doctor or health care professional if you get an attack of severe diarrhea, nausea and vomiting, or if you sweat a lot. The loss of too much body fluid can make it dangerous for you to take this medicine. Do not become pregnant while taking this medicine or for at least 2 months after stopping it. Women should inform their doctor if they wish to become pregnant or think they might be pregnant. Men should not father a child while taking this medicine and for at least 2 months after stopping it. There is a potential for serious side effects to an unborn child. Talk to your health care professional or pharmacist for more information. Do not breast-feed an infant while taking this medicine or for 2 months after stopping it. This medicine may interfere with the ability to have a child. You should talk with your doctor or health care professional if you are concerned about your fertility. What side effects may I notice from receiving this medicine? Side effects that you should report to your doctor or health care professional as soon as possible: -allergic reactions like skin rash, itching or hives, swelling of the face, lips, or tongue -breathing problems -changes in hearing -changes in vision -fast, irregular heartbeat -feeling faint or lightheaded, falls -pain, tingling, numbness in the hands or feet -right upper belly pain -seizures -swelling of the ankles, feet, hands -unusual bleeding or bruising -unusually weak or tired -vomiting -yellowing of the eyes or skin Side effects that usually do not require medical attention (report to your  doctor or health care professional if they continue or are bothersome): -changes in emotions or moods -constipation -diarrhea -loss of appetite -headache -irritation at site where injected -nausea This list may not describe all possible side effects. Call your doctor for medical advice about side effects. You may report side effects to FDA at 1-800-FDA-1088. Where should I keep my medicine? This drug is given in a hospital or clinic and will not be stored at home. NOTE: This sheet is a summary. It may not cover all possible information. If you have questions about this medicine, talk to your doctor, pharmacist, or health care provider.  2018 Elsevier/Gold Standard (2016-07-24 15:53:51)

## 2018-06-23 DIAGNOSIS — E1129 Type 2 diabetes mellitus with other diabetic kidney complication: Secondary | ICD-10-CM | POA: Diagnosis not present

## 2018-06-23 DIAGNOSIS — N186 End stage renal disease: Secondary | ICD-10-CM | POA: Diagnosis not present

## 2018-06-23 DIAGNOSIS — D509 Iron deficiency anemia, unspecified: Secondary | ICD-10-CM | POA: Diagnosis not present

## 2018-06-23 DIAGNOSIS — D631 Anemia in chronic kidney disease: Secondary | ICD-10-CM | POA: Diagnosis not present

## 2018-06-23 DIAGNOSIS — N2581 Secondary hyperparathyroidism of renal origin: Secondary | ICD-10-CM | POA: Diagnosis not present

## 2018-06-24 ENCOUNTER — Telehealth: Payer: Self-pay | Admitting: Neurology

## 2018-06-24 NOTE — Telephone Encounter (Signed)
Patient's wife called to make sure that her FMLA paperwork through [employer] was received and if not to please call her at [phone]. Thanks

## 2018-06-24 NOTE — Telephone Encounter (Signed)
Paperwork received and given to Dr. Delice Lesch yesterday.

## 2018-06-25 DIAGNOSIS — N2581 Secondary hyperparathyroidism of renal origin: Secondary | ICD-10-CM | POA: Diagnosis not present

## 2018-06-25 DIAGNOSIS — E1129 Type 2 diabetes mellitus with other diabetic kidney complication: Secondary | ICD-10-CM | POA: Diagnosis not present

## 2018-06-25 DIAGNOSIS — N186 End stage renal disease: Secondary | ICD-10-CM | POA: Diagnosis not present

## 2018-06-25 DIAGNOSIS — D509 Iron deficiency anemia, unspecified: Secondary | ICD-10-CM | POA: Diagnosis not present

## 2018-06-25 DIAGNOSIS — D631 Anemia in chronic kidney disease: Secondary | ICD-10-CM | POA: Diagnosis not present

## 2018-06-28 DIAGNOSIS — N2581 Secondary hyperparathyroidism of renal origin: Secondary | ICD-10-CM | POA: Diagnosis not present

## 2018-06-28 DIAGNOSIS — D631 Anemia in chronic kidney disease: Secondary | ICD-10-CM | POA: Diagnosis not present

## 2018-06-28 DIAGNOSIS — E1129 Type 2 diabetes mellitus with other diabetic kidney complication: Secondary | ICD-10-CM | POA: Diagnosis not present

## 2018-06-28 DIAGNOSIS — D509 Iron deficiency anemia, unspecified: Secondary | ICD-10-CM | POA: Diagnosis not present

## 2018-06-28 DIAGNOSIS — N186 End stage renal disease: Secondary | ICD-10-CM | POA: Diagnosis not present

## 2018-06-29 ENCOUNTER — Inpatient Hospital Stay: Payer: Medicare Other

## 2018-06-29 ENCOUNTER — Telehealth: Payer: Self-pay | Admitting: Neurology

## 2018-06-29 VITALS — BP 133/71 | HR 81 | Temp 98.6°F | Resp 18

## 2018-06-29 DIAGNOSIS — C9000 Multiple myeloma not having achieved remission: Secondary | ICD-10-CM | POA: Diagnosis not present

## 2018-06-29 DIAGNOSIS — D472 Monoclonal gammopathy: Secondary | ICD-10-CM

## 2018-06-29 DIAGNOSIS — Z5112 Encounter for antineoplastic immunotherapy: Secondary | ICD-10-CM | POA: Diagnosis not present

## 2018-06-29 LAB — CBC WITH DIFFERENTIAL (CANCER CENTER ONLY)
Abs Immature Granulocytes: 0.03 10*3/uL (ref 0.00–0.07)
Basophils Absolute: 0.1 10*3/uL (ref 0.0–0.1)
Basophils Relative: 1 %
Eosinophils Absolute: 0 10*3/uL (ref 0.0–0.5)
Eosinophils Relative: 0 %
HCT: 23 % — ABNORMAL LOW (ref 39.0–52.0)
Hemoglobin: 8.6 g/dL — ABNORMAL LOW (ref 13.0–17.0)
Immature Granulocytes: 0 %
Lymphocytes Relative: 2 %
Lymphs Abs: 0.2 10*3/uL — ABNORMAL LOW (ref 0.7–4.0)
MCH: 33.1 pg (ref 26.0–34.0)
MCHC: 37.4 g/dL — ABNORMAL HIGH (ref 30.0–36.0)
MCV: 88.5 fL (ref 80.0–100.0)
Monocytes Absolute: 0.2 10*3/uL (ref 0.1–1.0)
Monocytes Relative: 2 %
Neutro Abs: 8.5 10*3/uL — ABNORMAL HIGH (ref 1.7–7.7)
Neutrophils Relative %: 95 %
Platelet Count: 318 10*3/uL (ref 150–400)
RBC: 2.6 MIL/uL — ABNORMAL LOW (ref 4.22–5.81)
RDW: 18.9 % — ABNORMAL HIGH (ref 11.5–15.5)
WBC Count: 9 10*3/uL (ref 4.0–10.5)
nRBC: 28.6 % — ABNORMAL HIGH (ref 0.0–0.2)

## 2018-06-29 LAB — CMP (CANCER CENTER ONLY)
ALT: 11 U/L (ref 0–44)
AST: 13 U/L — ABNORMAL LOW (ref 15–41)
Albumin: 3.7 g/dL (ref 3.5–5.0)
Alkaline Phosphatase: 69 U/L (ref 38–126)
Anion gap: 10 (ref 5–15)
BUN: 29 mg/dL — ABNORMAL HIGH (ref 6–20)
CO2: 31 mmol/L (ref 22–32)
Calcium: 9.5 mg/dL (ref 8.9–10.3)
Chloride: 95 mmol/L — ABNORMAL LOW (ref 98–111)
Creatinine: 8.48 mg/dL (ref 0.61–1.24)
GFR, Est AFR Am: 7 mL/min — ABNORMAL LOW (ref >60)
GFR, Estimated: 6 mL/min — ABNORMAL LOW (ref >60)
Glucose, Bld: 226 mg/dL — ABNORMAL HIGH (ref 70–99)
Potassium: 4.4 mmol/L (ref 3.5–5.1)
Sodium: 136 mmol/L (ref 135–145)
Total Bilirubin: 1.2 mg/dL (ref 0.3–1.2)
Total Protein: 9.3 g/dL — ABNORMAL HIGH (ref 6.5–8.1)

## 2018-06-29 MED ORDER — BORTEZOMIB CHEMO SQ INJECTION 3.5 MG (2.5MG/ML)
1.5000 mg/m2 | Freq: Once | INTRAMUSCULAR | Status: AC
  Administered 2018-06-29: 3.25 mg via SUBCUTANEOUS
  Filled 2018-06-29: qty 1.3

## 2018-06-29 MED ORDER — PROCHLORPERAZINE MALEATE 10 MG PO TABS: 10.0000 mg | ORAL_TABLET | Freq: Once | ORAL | Status: DC

## 2018-06-29 NOTE — Telephone Encounter (Signed)
Patient wife called and wants [place] to call her when the FMLA paperwork is ready

## 2018-06-29 NOTE — Telephone Encounter (Signed)
pls let me know when done

## 2018-06-29 NOTE — Progress Notes (Signed)
Per Dr Alen Blew ok to tx today with Creatinine of 8.48. Ok per Dr Alen Blew to have pharmacy alter the treatment parameters to reflect ok to tx regardless of elevated creatinine levels. Will notify pharmacy.

## 2018-06-29 NOTE — Progress Notes (Signed)
Pt  Took compazine at home.

## 2018-06-29 NOTE — Patient Instructions (Signed)
[facility] Discharge Instructions for Patients Receiving Chemotherapy  Today you received the following chemotherapy agents Velcade  To help prevent nausea and vomiting after your treatment, we encourage you to take your nausea medication as prescribed.   If you develop nausea and vomiting that is not controlled by your nausea medication, call the clinic.   BELOW ARE SYMPTOMS THAT SHOULD BE REPORTED IMMEDIATELY:  *FEVER GREATER THAN 100.5 F  *CHILLS WITH OR WITHOUT FEVER  NAUSEA AND VOMITING THAT IS NOT CONTROLLED WITH YOUR NAUSEA MEDICATION  *UNUSUAL SHORTNESS OF BREATH  *UNUSUAL BRUISING OR BLEEDING  TENDERNESS IN MOUTH AND THROAT WITH OR WITHOUT PRESENCE OF ULCERS  *URINARY PROBLEMS  *BOWEL PROBLEMS  UNUSUAL RASH Items with * indicate a potential emergency and should be followed up as soon as possible.  Feel free to call the clinic should you have any questions or concerns. The clinic phone number is [phone].  Please show the CHEMO ALERT CARD at check-in to the Emergency Department and triage nurse.  (Velcade) Bortezomib injection What is this medicine? BORTEZOMIB (bor TEZ oh mib) is a medicine that targets proteins in cancer cells and stops the cancer cells from growing. It is used to treat multiple myeloma and mantle-cell lymphoma. This medicine may be used for other purposes; ask your health care provider or pharmacist if you have questions. COMMON BRAND NAME(S): Velcade What should I tell my health care provider before I take this medicine? They need to know if you have any of these conditions: -diabetes -heart disease -irregular heartbeat -liver disease -on hemodialysis -low blood counts, like low white blood cells, platelets, or hemoglobin -peripheral neuropathy -taking medicine for blood pressure -an unusual or allergic reaction to bortezomib, mannitol, boron, other medicines, foods, dyes, or preservatives -pregnant or trying to get  pregnant -breast-feeding How should I use this medicine? This medicine is for injection into a vein or for injection under the skin. It is given by a health care professional in a hospital or clinic setting. Talk to your pediatrician regarding the use of this medicine in children. Special care may be needed. Overdosage: If you think you have taken too much of this medicine contact a poison control center or emergency room at once. NOTE: This medicine is only for you. Do not share this medicine with others. What if I miss a dose? It is important not to miss your dose. Call your doctor or health care professional if you are unable to keep an appointment. What may interact with this medicine? This medicine may interact with the following medications: -ketoconazole -rifampin -ritonavir -St. John's Wort This list may not describe all possible interactions. Give your health care provider a list of all the medicines, herbs, non-prescription drugs, or dietary supplements you use. Also tell them if you smoke, drink alcohol, or use illegal drugs. Some items may interact with your medicine. What should I watch for while using this medicine? You may get drowsy or dizzy. Do not drive, use machinery, or do anything that needs mental alertness until you know how this medicine affects you. Do not stand or sit up quickly, especially if you are an older patient. This reduces the risk of dizzy or fainting spells. In some cases, you may be given additional medicines to help with side effects. Follow all directions for their use. Call your doctor or health care professional for advice if you get a fever, chills or sore throat, or other symptoms of a cold or flu. Do not   treat yourself. This drug decreases your body's ability to fight infections. Try to avoid being around people who are sick. This medicine may increase your risk to bruise or bleed. Call your doctor or health care professional if you notice any unusual  bleeding. You may need blood work done while you are taking this medicine. In some patients, this medicine may cause a serious brain infection that may cause death. If you have any problems seeing, thinking, speaking, walking, or standing, tell your doctor right away. If you cannot reach your doctor, urgently seek other source of medical care. Check with your doctor or health care professional if you get an attack of severe diarrhea, nausea and vomiting, or if you sweat a lot. The loss of too much body fluid can make it dangerous for you to take this medicine. Do not become pregnant while taking this medicine or for at least 2 months after stopping it. Women should inform their doctor if they wish to become pregnant or think they might be pregnant. Men should not father a child while taking this medicine and for at least 2 months after stopping it. There is a potential for serious side effects to an unborn child. Talk to your health care professional or pharmacist for more information. Do not breast-feed an infant while taking this medicine or for 2 months after stopping it. This medicine may interfere with the ability to have a child. You should talk with your doctor or health care professional if you are concerned about your fertility. What side effects may I notice from receiving this medicine? Side effects that you should report to your doctor or health care professional as soon as possible: -allergic reactions like skin rash, itching or hives, swelling of the face, lips, or tongue -breathing problems -changes in hearing -changes in vision -fast, irregular heartbeat -feeling faint or lightheaded, falls -pain, tingling, numbness in the hands or feet -right upper belly pain -seizures -swelling of the ankles, feet, hands -unusual bleeding or bruising -unusually weak or tired -vomiting -yellowing of the eyes or skin Side effects that usually do not require medical attention (report to your  doctor or health care professional if they continue or are bothersome): -changes in emotions or moods -constipation -diarrhea -loss of appetite -headache -irritation at site where injected -nausea This list may not describe all possible side effects. Call your doctor for medical advice about side effects. You may report side effects to FDA at 1-800-FDA-1088. Where should I keep my medicine? This drug is given in a hospital or clinic and will not be stored at home. NOTE: This sheet is a summary. It may not cover all possible information. If you have questions about this medicine, talk to your doctor, pharmacist, or health care provider.  2018 Elsevier/Gold Standard (2016-07-24 15:53:51)

## 2018-06-30 DIAGNOSIS — D509 Iron deficiency anemia, unspecified: Secondary | ICD-10-CM | POA: Diagnosis not present

## 2018-06-30 DIAGNOSIS — D631 Anemia in chronic kidney disease: Secondary | ICD-10-CM | POA: Diagnosis not present

## 2018-06-30 DIAGNOSIS — E1129 Type 2 diabetes mellitus with other diabetic kidney complication: Secondary | ICD-10-CM | POA: Diagnosis not present

## 2018-06-30 DIAGNOSIS — N2581 Secondary hyperparathyroidism of renal origin: Secondary | ICD-10-CM | POA: Diagnosis not present

## 2018-06-30 DIAGNOSIS — N186 End stage renal disease: Secondary | ICD-10-CM | POA: Diagnosis not present

## 2018-06-30 LAB — KAPPA/LAMBDA LIGHT CHAINS
Kappa free light chain: 420.7 mg/L — ABNORMAL HIGH (ref 3.3–19.4)
Kappa, lambda light chain ratio: 7.47 — ABNORMAL HIGH (ref 0.26–1.65)
Lambda free light chains: 56.3 mg/L — ABNORMAL HIGH (ref 5.7–26.3)

## 2018-06-30 NOTE — Telephone Encounter (Signed)
When I last saw them, they reported no seizures since March. Is this FMLA for the seizures? I see an FMLA form filled out by his oncologist for the visits with them, I can only fill out for seizures and since he has not had them, I can write 1-2 a month just as needed if seizure occurs, if that is fine? Thanks

## 2018-07-01 LAB — MULTIPLE MYELOMA PANEL, SERUM
Albumin SerPl Elph-Mcnc: 4.1 g/dL (ref 2.9–4.4)
Albumin/Glob SerPl: 0.9 (ref 0.7–1.7)
Alpha 1: 0.3 g/dL (ref 0.0–0.4)
Alpha2 Glob SerPl Elph-Mcnc: 0.5 g/dL (ref 0.4–1.0)
B-Globulin SerPl Elph-Mcnc: 0.8 g/dL (ref 0.7–1.3)
Gamma Glob SerPl Elph-Mcnc: 3.2 g/dL — ABNORMAL HIGH (ref 0.4–1.8)
Globulin, Total: 4.8 g/dL — ABNORMAL HIGH (ref 2.2–3.9)
IgA: 40 mg/dL — ABNORMAL LOW (ref 90–386)
IgG (Immunoglobin G), Serum: 3581 mg/dL — ABNORMAL HIGH (ref 700–1600)
IgM (Immunoglobulin M), Srm: 26 mg/dL (ref 20–172)
M Protein SerPl Elph-Mcnc: 2.9 g/dL — ABNORMAL HIGH
Total Protein ELP: 8.9 g/dL — ABNORMAL HIGH (ref 6.0–8.5)

## 2018-07-02 DIAGNOSIS — N2581 Secondary hyperparathyroidism of renal origin: Secondary | ICD-10-CM | POA: Diagnosis not present

## 2018-07-02 DIAGNOSIS — N186 End stage renal disease: Secondary | ICD-10-CM | POA: Diagnosis not present

## 2018-07-02 DIAGNOSIS — E1129 Type 2 diabetes mellitus with other diabetic kidney complication: Secondary | ICD-10-CM | POA: Diagnosis not present

## 2018-07-02 DIAGNOSIS — D509 Iron deficiency anemia, unspecified: Secondary | ICD-10-CM | POA: Diagnosis not present

## 2018-07-02 DIAGNOSIS — D631 Anemia in chronic kidney disease: Secondary | ICD-10-CM | POA: Diagnosis not present

## 2018-07-05 DIAGNOSIS — D631 Anemia in chronic kidney disease: Secondary | ICD-10-CM | POA: Diagnosis not present

## 2018-07-05 DIAGNOSIS — N186 End stage renal disease: Secondary | ICD-10-CM | POA: Diagnosis not present

## 2018-07-05 DIAGNOSIS — D509 Iron deficiency anemia, unspecified: Secondary | ICD-10-CM | POA: Diagnosis not present

## 2018-07-05 DIAGNOSIS — E1129 Type 2 diabetes mellitus with other diabetic kidney complication: Secondary | ICD-10-CM | POA: Diagnosis not present

## 2018-07-05 DIAGNOSIS — N2581 Secondary hyperparathyroidism of renal origin: Secondary | ICD-10-CM | POA: Diagnosis not present

## 2018-07-06 ENCOUNTER — Inpatient Hospital Stay: Payer: Medicare Other

## 2018-07-06 ENCOUNTER — Inpatient Hospital Stay (HOSPITAL_BASED_OUTPATIENT_CLINIC_OR_DEPARTMENT_OTHER): Payer: Medicare Other | Admitting: Oncology

## 2018-07-06 ENCOUNTER — Telehealth: Payer: Self-pay | Admitting: Oncology

## 2018-07-06 ENCOUNTER — Telehealth: Payer: Self-pay

## 2018-07-06 VITALS — BP 125/64 | HR 80 | Temp 97.9°F | Resp 18 | Ht 67.0 in | Wt 215.6 lb

## 2018-07-06 DIAGNOSIS — N189 Chronic kidney disease, unspecified: Secondary | ICD-10-CM

## 2018-07-06 DIAGNOSIS — I129 Hypertensive chronic kidney disease with stage 1 through stage 4 chronic kidney disease, or unspecified chronic kidney disease: Secondary | ICD-10-CM | POA: Diagnosis not present

## 2018-07-06 DIAGNOSIS — D631 Anemia in chronic kidney disease: Secondary | ICD-10-CM

## 2018-07-06 DIAGNOSIS — C9000 Multiple myeloma not having achieved remission: Secondary | ICD-10-CM | POA: Diagnosis not present

## 2018-07-06 DIAGNOSIS — D472 Monoclonal gammopathy: Secondary | ICD-10-CM

## 2018-07-06 DIAGNOSIS — E1122 Type 2 diabetes mellitus with diabetic chronic kidney disease: Secondary | ICD-10-CM | POA: Diagnosis not present

## 2018-07-06 DIAGNOSIS — Z5112 Encounter for antineoplastic immunotherapy: Secondary | ICD-10-CM | POA: Diagnosis not present

## 2018-07-06 LAB — CBC WITH DIFFERENTIAL (CANCER CENTER ONLY)
Abs Immature Granulocytes: 0.03 10*3/uL (ref 0.00–0.07)
Basophils Absolute: 0 10*3/uL (ref 0.0–0.1)
Basophils Relative: 1 %
Eosinophils Absolute: 0 10*3/uL (ref 0.0–0.5)
Eosinophils Relative: 0 %
HCT: 24.4 % — ABNORMAL LOW (ref 39.0–52.0)
Hemoglobin: 9.1 g/dL — ABNORMAL LOW (ref 13.0–17.0)
Immature Granulocytes: 0 %
Lymphocytes Relative: 4 %
Lymphs Abs: 0.3 10*3/uL — ABNORMAL LOW (ref 0.7–4.0)
MCH: 33.5 pg (ref 26.0–34.0)
MCHC: 37.3 g/dL — ABNORMAL HIGH (ref 30.0–36.0)
MCV: 89.7 fL (ref 80.0–100.0)
Monocytes Absolute: 0.1 10*3/uL (ref 0.1–1.0)
Monocytes Relative: 2 %
Neutro Abs: 6.6 10*3/uL (ref 1.7–7.7)
Neutrophils Relative %: 93 %
Platelet Count: 325 10*3/uL (ref 150–400)
RBC: 2.72 MIL/uL — ABNORMAL LOW (ref 4.22–5.81)
RDW: 17.9 % — ABNORMAL HIGH (ref 11.5–15.5)
WBC Count: 7.1 10*3/uL (ref 4.0–10.5)
nRBC: 34.4 % — ABNORMAL HIGH (ref 0.0–0.2)

## 2018-07-06 LAB — CMP (CANCER CENTER ONLY)
ALT: 9 U/L (ref 0–44)
AST: 15 U/L (ref 15–41)
Albumin: 3.7 g/dL (ref 3.5–5.0)
Alkaline Phosphatase: 71 U/L (ref 38–126)
Anion gap: 9 (ref 5–15)
BUN: 27 mg/dL — ABNORMAL HIGH (ref 6–20)
CO2: 32 mmol/L (ref 22–32)
Calcium: 9.7 mg/dL (ref 8.9–10.3)
Chloride: 96 mmol/L — ABNORMAL LOW (ref 98–111)
Creatinine: 8.84 mg/dL (ref 0.61–1.24)
GFR, Est AFR Am: 7 mL/min — ABNORMAL LOW (ref >60)
GFR, Estimated: 6 mL/min — ABNORMAL LOW (ref >60)
Glucose, Bld: 189 mg/dL — ABNORMAL HIGH (ref 70–99)
Potassium: 4.4 mmol/L (ref 3.5–5.1)
Sodium: 137 mmol/L (ref 135–145)
Total Bilirubin: 1.3 mg/dL — ABNORMAL HIGH (ref 0.3–1.2)
Total Protein: 9.2 g/dL — ABNORMAL HIGH (ref 6.5–8.1)

## 2018-07-06 MED ORDER — PROCHLORPERAZINE MALEATE 10 MG PO TABS: 10.0000 mg | ORAL_TABLET | Freq: Once | ORAL | Status: DC

## 2018-07-06 MED ORDER — PROCHLORPERAZINE MALEATE 10 MG PO TABS
ORAL_TABLET | ORAL | Status: AC
  Filled 2018-07-06: qty 1

## 2018-07-06 MED ORDER — BORTEZOMIB CHEMO SQ INJECTION 3.5 MG (2.5MG/ML)
1.5000 mg/m2 | Freq: Once | INTRAMUSCULAR | Status: AC
  Administered 2018-07-06: 3.25 mg via SUBCUTANEOUS
  Filled 2018-07-06: qty 1.3

## 2018-07-06 NOTE — Telephone Encounter (Signed)
Gave pt avs and calendar  °

## 2018-07-06 NOTE — Patient Instructions (Signed)
[facility] Discharge Instructions for Patients Receiving Chemotherapy  Today you received the following chemotherapy agents:  Velcade  To help prevent nausea and vomiting after your treatment, we encourage you to take your nausea medication as prescribed.   If you develop nausea and vomiting that is not controlled by your nausea medication, call the clinic.   BELOW ARE SYMPTOMS THAT SHOULD BE REPORTED IMMEDIATELY:  *FEVER GREATER THAN 100.5 F  *CHILLS WITH OR WITHOUT FEVER  NAUSEA AND VOMITING THAT IS NOT CONTROLLED WITH YOUR NAUSEA MEDICATION  *UNUSUAL SHORTNESS OF BREATH  *UNUSUAL BRUISING OR BLEEDING  TENDERNESS IN MOUTH AND THROAT WITH OR WITHOUT PRESENCE OF ULCERS  *URINARY PROBLEMS  *BOWEL PROBLEMS  UNUSUAL RASH Items with * indicate a potential emergency and should be followed up as soon as possible.  Feel free to call the clinic should you have any questions or concerns. The clinic phone number is [phone].  Please show the [place] at check-in to the Emergency Department and triage nurse.

## 2018-07-06 NOTE — Telephone Encounter (Signed)
Received message from lab with critical creat of 8.84 and Dr. Alen Blew made aware.

## 2018-07-06 NOTE — Telephone Encounter (Signed)
Wife states that 1-2 a month is fine.  She just wants to be covered in case pt does experience a seizure.

## 2018-07-06 NOTE — Progress Notes (Signed)
Hematology and Oncology Follow Up Visit  Isaiah [name] [id number] 12-31-1965 52 y.o. 07/06/2018 12:49 PM    Principle Diagnosis: 52 year old man with  1.  IgG kappa multiple myeloma diagnosed in September 2019.  He was found to have 20% plasma cell involvement in the bone marrow at that time.  He presented with smoldering myeloma since 2012.    2.  Renal failure: Related to long-standing hypertension.  Under evaluation for renal transplant.  Current therapy: Velcade dexamethasone and cyclophosphamide weekly started on 05/25/2018.  Velcade is given at 1.5 mg/m weekly, dexamethasone is 20 mg weekly and and Cytoxan is given at 650 mg total dose.   Interim History:  Isaiah [name] presents returns today for repeat evaluation.  Since her last visit, he reports no major changes or complaints.  He does report slight increased fatigue and tiredness no shortness of breath or dyspnea on exertion.  He has tolerated therapy without any new complaints or side effects.  He denies any peripheral neuropathy, infusion related complications or GI toxicities.  His appetite and performance status is unchanged.  He denies any dizziness or unsteadiness.  He continues to receive dialysis without any issues or complications.  He does not report any headaches, blurry vision, syncope.  He does not report any alteration mental status or lethargy.  He does not report any fevers, chills, sweats or weight change. He does not report any  palpitation, orthopnea or leg edema. Does not report any cough or hemoptysis or hematemesis. He does not report any nausea, vomiting.  He denies any depression or diarrhea. He does not report any skin rashes or lesions.  He denies any bleeding or clotting episodes.  Denies any lymphadenopathy or petechiae.  Denies any use in his mood.  Rest of his review of systems is negative.   Medications: I have reviewed the patient's current medications.  Current Outpatient Medications:  .  acyclovir  (ZOVIRAX) 400 MG tablet, Take 1 tablet (400 mg total) by mouth daily., Disp: 90 tablet, Rfl: 0 .  albuterol (PROAIR HFA) 108 (90 Base) MCG/ACT inhaler, Inhale two puffs every 4-6 hours if needed for cough or wheeze, Disp: 1 Inhaler, Rfl: 1 .  aspirin EC 81 MG tablet, Take 1 tablet (81 mg total) by mouth daily., Disp: 100 tablet, Rfl: 0 .  calcium acetate (PHOSLO) 667 MG capsule, Take 2,001 mg by mouth 3 (three) times daily with meals. , Disp: , Rfl:  .  Cetirizine HCl 10 MG CAPS, Take 1 capsule by mouth daily., Disp: , Rfl:  .  cinacalcet (SENSIPAR) 60 MG tablet, Take 60 mg by mouth every other day. , Disp: , Rfl:  .  clonazePAM (KLONOPIN) 0.5 MG tablet, Take 1 tablet prior to each dialysis session, Disp: 15 tablet, Rfl: 5 .  cyclophosphamide (CYTOXAN) 50 MG capsule, Take 13 capsules (657m) by mouth once weekly, take early in the day, Disp: 52 capsule, Rfl: 3 .  dexamethasone (DECADRON) 4 MG tablet, Take 5 tablets weekly, Disp: 90 tablet, Rfl: 3 .  fluticasone furoate-vilanterol (BREO ELLIPTA) 100-25 MCG/INH AEPB, Inhale one puff once daily to prevent cough or wheeze, Disp: 60 each, Rfl: 5 .  insulin glargine (LANTUS) 100 unit/mL SOPN, Inject 8 Units into the skin at bedtime. , Disp: , Rfl:  .  Lacosamide (VIMPAT) 150 MG TABS, Take 1 tablet twice a day. Take an additional 1 tablet after dialysis session three times a week., Disp: 75 tablet, Rfl: 5 .  lamoTRIgine (LAMICTAL) 150 MG tablet, Take  1 tablet (150 mg total) by mouth 2 (two) times daily., Disp: 180 tablet, Rfl: 3 .  metoprolol tartrate (LOPRESSOR) 25 MG tablet, Take 0.5 tablets (12.5 mg total) by mouth 2 (two) times daily., Disp: 60 tablet, Rfl: 0 .  midodrine (PROAMATINE) 10 MG tablet, Take 10 mg by mouth 2 (two) times daily with a meal. , Disp: , Rfl:  .  multivitamin (RENA-VIT) TABS tablet, Take 1 tablet by mouth at bedtime., Disp: 30 tablet, Rfl: 1 .  pantoprazole (PROTONIX) 40 MG tablet, Take 1 tablet (40 mg total) by mouth 2 (two)  times daily. (Patient taking differently: Take 40 mg by mouth every morning. ), Disp: 60 tablet, Rfl: 1 .  prochlorperazine (COMPAZINE) 10 MG tablet, Take 1 tablet (10 mg total) by mouth every 6 (six) hours as needed for nausea or vomiting., Disp: 30 tablet, Rfl: 0  Allergies:  Allergies  Allergen Reactions  . Codeine Rash    Unknown reaction (patient says it was more serious than just a rash, but he can't remember what happened)    Past Medical History, Surgical history, Social history, and Family History were reviewed and updated.  Physical Exam:  Blood pressure 125/64, pulse 80, temperature 97.9 F (36.6 C), temperature source Oral, resp. rate 18, height 5' 7" (1.702 m), weight 215 lb 9.6 oz (97.8 kg), SpO2 96 %.    ECOG: 1   General appearance: Comfortable appearing without any discomfort Head: Normocephalic without any trauma Oropharynx: Mucous membranes are moist and pink without any thrush or ulcers. Eyes: Pupils are equal and round reactive to light. Lymph nodes: No cervical, supraclavicular, inguinal or axillary lymphadenopathy.   Heart:regular rate and rhythm.  S1 and S2 without leg edema. Lung: Clear without any rhonchi or wheezes.  No dullness to percussion. Abdomin: Soft, nontender, nondistended with good bowel sounds.  No hepatosplenomegaly. Musculoskeletal: No joint deformity or effusion.  Full range of motion noted. Neurological: No deficits noted on motor, sensory and deep tendon reflex exam. Skin: No petechial rash or dryness.  Appeared moist.      Lab Results: Lab Results  Component Value Date   WBC 9.0 06/29/2018   HGB 8.6 (L) 06/29/2018   HCT 23.0 (L) 06/29/2018   MCV 88.5 06/29/2018   PLT 318 06/29/2018     Chemistry      Component Value Date/Time   NA 136 06/29/2018 1132   NA 138 07/03/2017 0813   K 4.4 06/29/2018 1132   K 4.2 07/03/2017 0813   CL 95 (L) 06/29/2018 1132   CO2 31 06/29/2018 1132   CO2 27 07/03/2017 0813   BUN 29 (H)  06/29/2018 1132   BUN 46.4 (H) 07/03/2017 0813   CREATININE 8.48 (HH) 06/29/2018 1132   CREATININE 10.9 (HH) 07/03/2017 0813      Component Value Date/Time   CALCIUM 9.5 06/29/2018 1132   CALCIUM 9.3 07/03/2017 0813   ALKPHOS 69 06/29/2018 1132   ALKPHOS 77 07/03/2017 0813   AST 13 (L) 06/29/2018 1132   AST 16 07/03/2017 0813   ALT 11 06/29/2018 1132   ALT 14 07/03/2017 0813   BILITOT 1.2 06/29/2018 1132   BILITOT 0.93 07/03/2017 0813           Results for Hinojos, Alani W (MRN [id number]) as of 07/06/2018 12:49  Ref. Range 06/01/2018 12:35 06/29/2018 11:32  M Protein SerPl Elph-Mcnc Latest Ref Range: Not Observed g/dL 4.0 (H) 2.9 (H)  IFE 1 Unknown Comment Comment  Globulin, Total Latest Ref Range:   2.2 - 3.9 g/dL 6.1 (H) 4.8 (H)  B-Globulin SerPl Elph-Mcnc Latest Ref Range: 0.7 - 1.3 g/dL 0.8 0.8  IgG (Immunoglobin G), Serum Latest Ref Range: 700 - 1,600 mg/dL 5,329 (H) 3,581 (H)  IgM (Immunoglobulin M), Srm Latest Ref Range: 20 - 172 mg/dL 27 26  IgA Latest Ref Range: 90 - 386 mg/dL 60 (L) 40 (L)    Results for ALF, DOYLE (MRN [id number]) as of 07/06/2018 12:49  Ref. Range 06/01/2018 12:35 06/29/2018 11:32  Kappa free light chain Latest Ref Range: 3.3 - 19.4 mg/L 1,077.7 (H) 420.7 (H)  Lamda free light chains Latest Ref Range: 5.7 - 26.3 mg/L 80.1 (H) 56.3 (H)  Kappa, lamda light chain ratio Latest Ref Range: 0.26 - 1.65  13.45 (H) 7.47 (H)    Impression and Plan:  52 year old man with  1.  IgG kappa multiple myeloma confirmed in September 2019.  He was diagnosed with smoldering myeloma in 2012.    He is currently receiving treatment with Velcade, dexamethasone and Cytoxan without any new side effects.  Protein studies obtained on June 29, 2018 were personally reviewed today and showed excellent response to therapy.  His M spike has decreased from 4.0 to 2.9 in addition to decrease in his IgG level from 5679 down to 3581.  Risks and benefits of continuing this  therapy at this time was discussed and is agreeable to proceed.  Plan is to complete 3 to 6 months of therapy for evaluating for possible renal transplant.   2. Anemia: Related to chemotherapy, renal disease as well as plasma cell disorder.  He has no active bleeding noted at this time.  The risks and benefits of a pack red cell transfusion was reviewed today and we will tentatively schedule that for a week from today preventatively.  He is mildly symptomatic but will likely be more symptomatic with worsening anemia.  3. Renal failure: Currently on dialysis Monday Wednesday and Friday.  No urine output at this time.  No issues with dialysis on chemotherapy.  4.  Zoster prophylaxis: No recent reactivation noted.  5.  Diabetes: Slight elevation noted at this time his insulin has been adjusted.  No major complications.  6.  Nausea prophylaxis: No issues with nausea and vomiting at this time.  7.  Goals of care: His disease is incurable but his performance status is adequate and aggressive therapy is warranted  8. Follow-up: He will continue to receive weekly therapy and MD follow-up in 4 to 5 weeks.  25  minutes was spent with the patient face-to-face today.  More than 50% of time was dedicated to discussing the natural course of his disease, reviewing laboratory data and coordinating plan of care.     Zola Button, MD 10/29/201912:49 PM

## 2018-07-06 NOTE — Telephone Encounter (Signed)
patient wife is calling back on the FMLA paperwork to see if they are ready. She needs them before 07-08-18 and she also wanted to let someone know that her husband is now going thur cancer treatments. Please call

## 2018-07-07 ENCOUNTER — Telehealth: Payer: Self-pay

## 2018-07-07 DIAGNOSIS — E1129 Type 2 diabetes mellitus with other diabetic kidney complication: Secondary | ICD-10-CM | POA: Diagnosis not present

## 2018-07-07 DIAGNOSIS — N2581 Secondary hyperparathyroidism of renal origin: Secondary | ICD-10-CM | POA: Diagnosis not present

## 2018-07-07 DIAGNOSIS — D631 Anemia in chronic kidney disease: Secondary | ICD-10-CM | POA: Diagnosis not present

## 2018-07-07 DIAGNOSIS — D509 Iron deficiency anemia, unspecified: Secondary | ICD-10-CM | POA: Diagnosis not present

## 2018-07-07 DIAGNOSIS — N186 End stage renal disease: Secondary | ICD-10-CM | POA: Diagnosis not present

## 2018-07-07 NOTE — Telephone Encounter (Signed)
Spouse called and stated that Dr. Jimmy Footman (nephrologist) wants to hold of on the scheduled transfusion for 11/5. Explained that the transfusion is tentative and based on the labs that morning. MD made aware.

## 2018-07-07 NOTE — Telephone Encounter (Signed)
Copy made of paperwork and place for scanning.  Original paperwork placed at front desk for pickup

## 2018-07-07 NOTE — Telephone Encounter (Signed)
Done, thanks

## 2018-07-07 NOTE — Telephone Encounter (Signed)
Spoke with Isaiah [name] her aware that paperwork is ready for pickup.  Isaiah [name] states that she will be by the office tomorrow.

## 2018-07-08 DIAGNOSIS — Z992 Dependence on renal dialysis: Secondary | ICD-10-CM | POA: Diagnosis not present

## 2018-07-08 DIAGNOSIS — N186 End stage renal disease: Secondary | ICD-10-CM | POA: Diagnosis not present

## 2018-07-08 DIAGNOSIS — E1122 Type 2 diabetes mellitus with diabetic chronic kidney disease: Secondary | ICD-10-CM | POA: Diagnosis not present

## 2018-07-09 DIAGNOSIS — E1129 Type 2 diabetes mellitus with other diabetic kidney complication: Secondary | ICD-10-CM | POA: Diagnosis not present

## 2018-07-09 DIAGNOSIS — N186 End stage renal disease: Secondary | ICD-10-CM | POA: Diagnosis not present

## 2018-07-09 DIAGNOSIS — D631 Anemia in chronic kidney disease: Secondary | ICD-10-CM | POA: Diagnosis not present

## 2018-07-09 DIAGNOSIS — D509 Iron deficiency anemia, unspecified: Secondary | ICD-10-CM | POA: Diagnosis not present

## 2018-07-09 DIAGNOSIS — Z992 Dependence on renal dialysis: Secondary | ICD-10-CM | POA: Diagnosis not present

## 2018-07-09 DIAGNOSIS — E1122 Type 2 diabetes mellitus with diabetic chronic kidney disease: Secondary | ICD-10-CM | POA: Diagnosis not present

## 2018-07-09 DIAGNOSIS — N2581 Secondary hyperparathyroidism of renal origin: Secondary | ICD-10-CM | POA: Diagnosis not present

## 2018-07-12 ENCOUNTER — Encounter: Payer: Self-pay | Admitting: Internal Medicine

## 2018-07-12 DIAGNOSIS — N2581 Secondary hyperparathyroidism of renal origin: Secondary | ICD-10-CM | POA: Diagnosis not present

## 2018-07-12 DIAGNOSIS — N186 End stage renal disease: Secondary | ICD-10-CM | POA: Diagnosis not present

## 2018-07-12 DIAGNOSIS — D631 Anemia in chronic kidney disease: Secondary | ICD-10-CM | POA: Diagnosis not present

## 2018-07-12 DIAGNOSIS — D509 Iron deficiency anemia, unspecified: Secondary | ICD-10-CM | POA: Diagnosis not present

## 2018-07-12 DIAGNOSIS — E1129 Type 2 diabetes mellitus with other diabetic kidney complication: Secondary | ICD-10-CM | POA: Diagnosis not present

## 2018-07-13 ENCOUNTER — Inpatient Hospital Stay: Payer: Medicare Other

## 2018-07-13 ENCOUNTER — Ambulatory Visit: Payer: 59

## 2018-07-13 ENCOUNTER — Other Ambulatory Visit: Payer: 59

## 2018-07-13 ENCOUNTER — Inpatient Hospital Stay: Payer: Medicare Other | Attending: Oncology

## 2018-07-13 VITALS — BP 118/62 | HR 78 | Temp 99.0°F | Resp 17 | Ht 67.0 in | Wt 216.2 lb

## 2018-07-13 DIAGNOSIS — Z5112 Encounter for antineoplastic immunotherapy: Secondary | ICD-10-CM | POA: Diagnosis not present

## 2018-07-13 DIAGNOSIS — C9000 Multiple myeloma not having achieved remission: Secondary | ICD-10-CM | POA: Diagnosis not present

## 2018-07-13 DIAGNOSIS — D472 Monoclonal gammopathy: Secondary | ICD-10-CM

## 2018-07-13 LAB — CBC WITH DIFFERENTIAL (CANCER CENTER ONLY)
Abs Immature Granulocytes: 0.03 10*3/uL (ref 0.00–0.07)
Basophils Absolute: 0.1 10*3/uL (ref 0.0–0.1)
Basophils Relative: 2 %
Eosinophils Absolute: 0.4 10*3/uL (ref 0.0–0.5)
Eosinophils Relative: 4 %
HCT: 23.1 % — ABNORMAL LOW (ref 39.0–52.0)
Hemoglobin: 8.5 g/dL — ABNORMAL LOW (ref 13.0–17.0)
Immature Granulocytes: 0 %
Lymphocytes Relative: 15 %
Lymphs Abs: 1.3 10*3/uL (ref 0.7–4.0)
MCH: 34 pg (ref 26.0–34.0)
MCHC: 36.8 g/dL — ABNORMAL HIGH (ref 30.0–36.0)
MCV: 92.4 fL (ref 80.0–100.0)
Monocytes Absolute: 1.4 10*3/uL — ABNORMAL HIGH (ref 0.1–1.0)
Monocytes Relative: 16 %
Neutro Abs: 5.4 10*3/uL (ref 1.7–7.7)
Neutrophils Relative %: 63 %
Platelet Count: 300 10*3/uL (ref 150–400)
RBC: 2.5 MIL/uL — ABNORMAL LOW (ref 4.22–5.81)
RDW: 17.9 % — ABNORMAL HIGH (ref 11.5–15.5)
WBC Count: 8.7 10*3/uL (ref 4.0–10.5)
nRBC: 32.3 % — ABNORMAL HIGH (ref 0.0–0.2)

## 2018-07-13 LAB — CMP (CANCER CENTER ONLY)
ALT: 8 U/L (ref 0–44)
AST: 14 U/L — ABNORMAL LOW (ref 15–41)
Albumin: 3.4 g/dL — ABNORMAL LOW (ref 3.5–5.0)
Alkaline Phosphatase: 64 U/L (ref 38–126)
Anion gap: 10 (ref 5–15)
BUN: 21 mg/dL — ABNORMAL HIGH (ref 6–20)
CO2: 31 mmol/L (ref 22–32)
Calcium: 9.3 mg/dL (ref 8.9–10.3)
Chloride: 97 mmol/L — ABNORMAL LOW (ref 98–111)
Creatinine: 9.04 mg/dL (ref 0.61–1.24)
GFR, Est AFR Am: 7 mL/min — ABNORMAL LOW (ref >60)
GFR, Estimated: 6 mL/min — ABNORMAL LOW (ref >60)
Glucose, Bld: 139 mg/dL — ABNORMAL HIGH (ref 70–99)
Potassium: 4.1 mmol/L (ref 3.5–5.1)
Sodium: 138 mmol/L (ref 135–145)
Total Bilirubin: 1.1 mg/dL (ref 0.3–1.2)
Total Protein: 7.9 g/dL (ref 6.5–8.1)

## 2018-07-13 LAB — SAMPLE TO BLOOD BANK

## 2018-07-13 MED ORDER — BORTEZOMIB CHEMO SQ INJECTION 3.5 MG (2.5MG/ML)
1.5000 mg/m2 | Freq: Once | INTRAMUSCULAR | Status: AC
  Administered 2018-07-13: 3.25 mg via SUBCUTANEOUS
  Filled 2018-07-13: qty 1.3

## 2018-07-13 MED ORDER — PROCHLORPERAZINE MALEATE 10 MG PO TABS: 10.0000 mg | ORAL_TABLET | Freq: Once | ORAL | Status: DC

## 2018-07-13 MED FILL — CYCLOPHOSPHAMIDE 50 MG CAP: 50 | 28 days supply | Qty: 52 | Fill #2

## 2018-07-13 NOTE — Patient Instructions (Signed)
[facility] Discharge Instructions for Patients Receiving Chemotherapy  Today you received the following chemotherapy agents: Bortezomib (Velcade)  To help prevent nausea and vomiting after your treatment, we encourage you to take your nausea medication as directed.    If you develop nausea and vomiting that is not controlled by your nausea medication, call the clinic.   BELOW ARE SYMPTOMS THAT SHOULD BE REPORTED IMMEDIATELY:  *FEVER GREATER THAN 100.5 F  *CHILLS WITH OR WITHOUT FEVER  NAUSEA AND VOMITING THAT IS NOT CONTROLLED WITH YOUR NAUSEA MEDICATION  *UNUSUAL SHORTNESS OF BREATH  *UNUSUAL BRUISING OR BLEEDING  TENDERNESS IN MOUTH AND THROAT WITH OR WITHOUT PRESENCE OF ULCERS  *URINARY PROBLEMS  *BOWEL PROBLEMS  UNUSUAL RASH Items with * indicate a potential emergency and should be followed up as soon as possible.  Feel free to call the clinic should you have any questions or concerns. The clinic phone number is [phone].  Please show the [place] at check-in to the Emergency Department and triage nurse.

## 2018-07-13 NOTE — Progress Notes (Signed)
Pt does not want to receive blood transfusion today. Per pt "My dialysis people told me I don't need to get blood, so I don't want it". Dr. Alen Blew updated, and OK to proceed with Velcade injection only today.

## 2018-07-14 DIAGNOSIS — N2581 Secondary hyperparathyroidism of renal origin: Secondary | ICD-10-CM | POA: Diagnosis not present

## 2018-07-14 DIAGNOSIS — D631 Anemia in chronic kidney disease: Secondary | ICD-10-CM | POA: Diagnosis not present

## 2018-07-14 DIAGNOSIS — D509 Iron deficiency anemia, unspecified: Secondary | ICD-10-CM | POA: Diagnosis not present

## 2018-07-14 DIAGNOSIS — E1129 Type 2 diabetes mellitus with other diabetic kidney complication: Secondary | ICD-10-CM | POA: Diagnosis not present

## 2018-07-14 DIAGNOSIS — N186 End stage renal disease: Secondary | ICD-10-CM | POA: Diagnosis not present

## 2018-07-15 ENCOUNTER — Telehealth: Payer: Self-pay

## 2018-07-15 NOTE — Telephone Encounter (Signed)
Patient wife called to r/s for a later time. Will call back after she see if dyalsis can be r/s. Per 11/7 vm return  call

## 2018-07-16 DIAGNOSIS — N186 End stage renal disease: Secondary | ICD-10-CM | POA: Diagnosis not present

## 2018-07-16 DIAGNOSIS — E1129 Type 2 diabetes mellitus with other diabetic kidney complication: Secondary | ICD-10-CM | POA: Diagnosis not present

## 2018-07-16 DIAGNOSIS — D509 Iron deficiency anemia, unspecified: Secondary | ICD-10-CM | POA: Diagnosis not present

## 2018-07-16 DIAGNOSIS — N2581 Secondary hyperparathyroidism of renal origin: Secondary | ICD-10-CM | POA: Diagnosis not present

## 2018-07-16 DIAGNOSIS — D631 Anemia in chronic kidney disease: Secondary | ICD-10-CM | POA: Diagnosis not present

## 2018-07-19 DIAGNOSIS — N2581 Secondary hyperparathyroidism of renal origin: Secondary | ICD-10-CM | POA: Diagnosis not present

## 2018-07-19 DIAGNOSIS — D631 Anemia in chronic kidney disease: Secondary | ICD-10-CM | POA: Diagnosis not present

## 2018-07-19 DIAGNOSIS — D509 Iron deficiency anemia, unspecified: Secondary | ICD-10-CM | POA: Diagnosis not present

## 2018-07-19 DIAGNOSIS — E1129 Type 2 diabetes mellitus with other diabetic kidney complication: Secondary | ICD-10-CM | POA: Diagnosis not present

## 2018-07-19 DIAGNOSIS — N186 End stage renal disease: Secondary | ICD-10-CM | POA: Diagnosis not present

## 2018-07-20 ENCOUNTER — Inpatient Hospital Stay: Payer: Medicare Other

## 2018-07-20 VITALS — BP 121/65 | HR 77 | Temp 97.6°F | Resp 16

## 2018-07-20 DIAGNOSIS — Z5112 Encounter for antineoplastic immunotherapy: Secondary | ICD-10-CM | POA: Diagnosis not present

## 2018-07-20 DIAGNOSIS — D472 Monoclonal gammopathy: Secondary | ICD-10-CM

## 2018-07-20 DIAGNOSIS — C9000 Multiple myeloma not having achieved remission: Secondary | ICD-10-CM | POA: Diagnosis not present

## 2018-07-20 LAB — CMP (CANCER CENTER ONLY)
ALT: 7 U/L (ref 0–44)
AST: 15 U/L (ref 15–41)
Albumin: 3.8 g/dL (ref 3.5–5.0)
Alkaline Phosphatase: 70 U/L (ref 38–126)
Anion gap: 11 (ref 5–15)
BUN: 29 mg/dL — ABNORMAL HIGH (ref 6–20)
CO2: 31 mmol/L (ref 22–32)
Calcium: 9.4 mg/dL (ref 8.9–10.3)
Chloride: 95 mmol/L — ABNORMAL LOW (ref 98–111)
Creatinine: 8.39 mg/dL (ref 0.61–1.24)
GFR, Est AFR Am: 7 mL/min — ABNORMAL LOW (ref >60)
GFR, Estimated: 6 mL/min — ABNORMAL LOW (ref >60)
Glucose, Bld: 173 mg/dL — ABNORMAL HIGH (ref 70–99)
Potassium: 4.6 mmol/L (ref 3.5–5.1)
Sodium: 137 mmol/L (ref 135–145)
Total Bilirubin: 1.3 mg/dL — ABNORMAL HIGH (ref 0.3–1.2)
Total Protein: 8.6 g/dL — ABNORMAL HIGH (ref 6.5–8.1)

## 2018-07-20 LAB — CBC WITH DIFFERENTIAL (CANCER CENTER ONLY)
Abs Immature Granulocytes: 0.04 10*3/uL (ref 0.00–0.07)
Basophils Absolute: 0.1 10*3/uL (ref 0.0–0.1)
Basophils Relative: 1 %
Eosinophils Absolute: 0 10*3/uL (ref 0.0–0.5)
Eosinophils Relative: 0 %
HCT: 25.2 % — ABNORMAL LOW (ref 39.0–52.0)
Hemoglobin: 9.4 g/dL — ABNORMAL LOW (ref 13.0–17.0)
Immature Granulocytes: 1 %
Lymphocytes Relative: 4 %
Lymphs Abs: 0.4 10*3/uL — ABNORMAL LOW (ref 0.7–4.0)
MCH: 34.6 pg — ABNORMAL HIGH (ref 26.0–34.0)
MCHC: 37.3 g/dL — ABNORMAL HIGH (ref 30.0–36.0)
MCV: 92.6 fL (ref 80.0–100.0)
Monocytes Absolute: 0.2 10*3/uL (ref 0.1–1.0)
Monocytes Relative: 2 %
Neutro Abs: 8.1 10*3/uL — ABNORMAL HIGH (ref 1.7–7.7)
Neutrophils Relative %: 92 %
Platelet Count: 356 10*3/uL (ref 150–400)
RBC: 2.72 MIL/uL — ABNORMAL LOW (ref 4.22–5.81)
RDW: 17.2 % — ABNORMAL HIGH (ref 11.5–15.5)
WBC Count: 8.8 10*3/uL (ref 4.0–10.5)
nRBC: 36.7 % — ABNORMAL HIGH (ref 0.0–0.2)

## 2018-07-20 MED ORDER — BORTEZOMIB CHEMO SQ INJECTION 3.5 MG (2.5MG/ML)
1.5000 mg/m2 | Freq: Once | INTRAMUSCULAR | Status: AC
  Administered 2018-07-20: 3.25 mg via SUBCUTANEOUS
  Filled 2018-07-20: qty 1.3

## 2018-07-20 MED ORDER — PROCHLORPERAZINE MALEATE 10 MG PO TABS: 10.0000 mg | ORAL_TABLET | Freq: Once | ORAL | Status: DC

## 2018-07-20 NOTE — Patient Instructions (Signed)
[facility] Discharge Instructions for Patients Receiving Chemotherapy  Today you received the following chemotherapy agents: Bortezomib (Velcade)  To help prevent nausea and vomiting after your treatment, we encourage you to take your nausea medication as directed.    If you develop nausea and vomiting that is not controlled by your nausea medication, call the clinic.   BELOW ARE SYMPTOMS THAT SHOULD BE REPORTED IMMEDIATELY:  *FEVER GREATER THAN 100.5 F  *CHILLS WITH OR WITHOUT FEVER  NAUSEA AND VOMITING THAT IS NOT CONTROLLED WITH YOUR NAUSEA MEDICATION  *UNUSUAL SHORTNESS OF BREATH  *UNUSUAL BRUISING OR BLEEDING  TENDERNESS IN MOUTH AND THROAT WITH OR WITHOUT PRESENCE OF ULCERS  *URINARY PROBLEMS  *BOWEL PROBLEMS  UNUSUAL RASH Items with * indicate a potential emergency and should be followed up as soon as possible.  Feel free to call the clinic should you have any questions or concerns. The clinic phone number is [phone].  Please show the [place] at check-in to the Emergency Department and triage nurse.

## 2018-07-21 DIAGNOSIS — N2581 Secondary hyperparathyroidism of renal origin: Secondary | ICD-10-CM | POA: Diagnosis not present

## 2018-07-21 DIAGNOSIS — N186 End stage renal disease: Secondary | ICD-10-CM | POA: Diagnosis not present

## 2018-07-21 DIAGNOSIS — D509 Iron deficiency anemia, unspecified: Secondary | ICD-10-CM | POA: Diagnosis not present

## 2018-07-21 DIAGNOSIS — E1129 Type 2 diabetes mellitus with other diabetic kidney complication: Secondary | ICD-10-CM | POA: Diagnosis not present

## 2018-07-21 DIAGNOSIS — D631 Anemia in chronic kidney disease: Secondary | ICD-10-CM | POA: Diagnosis not present

## 2018-07-23 DIAGNOSIS — N186 End stage renal disease: Secondary | ICD-10-CM | POA: Diagnosis not present

## 2018-07-23 DIAGNOSIS — D509 Iron deficiency anemia, unspecified: Secondary | ICD-10-CM | POA: Diagnosis not present

## 2018-07-23 DIAGNOSIS — N2581 Secondary hyperparathyroidism of renal origin: Secondary | ICD-10-CM | POA: Diagnosis not present

## 2018-07-23 DIAGNOSIS — D631 Anemia in chronic kidney disease: Secondary | ICD-10-CM | POA: Diagnosis not present

## 2018-07-23 DIAGNOSIS — E1129 Type 2 diabetes mellitus with other diabetic kidney complication: Secondary | ICD-10-CM | POA: Diagnosis not present

## 2018-07-26 ENCOUNTER — Other Ambulatory Visit: Payer: Self-pay | Admitting: Oncology

## 2018-07-26 DIAGNOSIS — E1129 Type 2 diabetes mellitus with other diabetic kidney complication: Secondary | ICD-10-CM | POA: Diagnosis not present

## 2018-07-26 DIAGNOSIS — D509 Iron deficiency anemia, unspecified: Secondary | ICD-10-CM | POA: Diagnosis not present

## 2018-07-26 DIAGNOSIS — N186 End stage renal disease: Secondary | ICD-10-CM | POA: Diagnosis not present

## 2018-07-26 DIAGNOSIS — N2581 Secondary hyperparathyroidism of renal origin: Secondary | ICD-10-CM | POA: Diagnosis not present

## 2018-07-26 DIAGNOSIS — D631 Anemia in chronic kidney disease: Secondary | ICD-10-CM | POA: Diagnosis not present

## 2018-07-27 ENCOUNTER — Inpatient Hospital Stay: Payer: Medicare Other

## 2018-07-27 VITALS — BP 130/51 | HR 77 | Temp 98.1°F | Resp 16

## 2018-07-27 DIAGNOSIS — D472 Monoclonal gammopathy: Secondary | ICD-10-CM

## 2018-07-27 DIAGNOSIS — C9000 Multiple myeloma not having achieved remission: Secondary | ICD-10-CM

## 2018-07-27 DIAGNOSIS — Z5112 Encounter for antineoplastic immunotherapy: Secondary | ICD-10-CM | POA: Diagnosis not present

## 2018-07-27 LAB — CMP (CANCER CENTER ONLY)
ALT: 9 U/L (ref 0–44)
AST: 13 U/L — ABNORMAL LOW (ref 15–41)
Albumin: 3.7 g/dL (ref 3.5–5.0)
Alkaline Phosphatase: 70 U/L (ref 38–126)
Anion gap: 10 (ref 5–15)
BUN: 24 mg/dL — ABNORMAL HIGH (ref 6–20)
CO2: 31 mmol/L (ref 22–32)
Calcium: 9.3 mg/dL (ref 8.9–10.3)
Chloride: 96 mmol/L — ABNORMAL LOW (ref 98–111)
Creatinine: 8.42 mg/dL (ref 0.61–1.24)
GFR, Est AFR Am: 7 mL/min — ABNORMAL LOW (ref >60)
GFR, Estimated: 6 mL/min — ABNORMAL LOW (ref >60)
Glucose, Bld: 181 mg/dL — ABNORMAL HIGH (ref 70–99)
Potassium: 4.5 mmol/L (ref 3.5–5.1)
Sodium: 137 mmol/L (ref 135–145)
Total Bilirubin: 1.2 mg/dL (ref 0.3–1.2)
Total Protein: 8.3 g/dL — ABNORMAL HIGH (ref 6.5–8.1)

## 2018-07-27 LAB — CBC WITH DIFFERENTIAL (CANCER CENTER ONLY)
Abs Immature Granulocytes: 0.03 10*3/uL (ref 0.00–0.07)
Basophils Absolute: 0.1 10*3/uL (ref 0.0–0.1)
Basophils Relative: 1 %
Eosinophils Absolute: 0 10*3/uL (ref 0.0–0.5)
Eosinophils Relative: 0 %
HCT: 26.6 % — ABNORMAL LOW (ref 39.0–52.0)
Hemoglobin: 9.9 g/dL — ABNORMAL LOW (ref 13.0–17.0)
Immature Granulocytes: 0 %
Lymphocytes Relative: 3 %
Lymphs Abs: 0.3 10*3/uL — ABNORMAL LOW (ref 0.7–4.0)
MCH: 34.7 pg — ABNORMAL HIGH (ref 26.0–34.0)
MCHC: 37.2 g/dL — ABNORMAL HIGH (ref 30.0–36.0)
MCV: 93.3 fL (ref 80.0–100.0)
Monocytes Absolute: 0.2 10*3/uL (ref 0.1–1.0)
Monocytes Relative: 2 %
Neutro Abs: 7.9 10*3/uL — ABNORMAL HIGH (ref 1.7–7.7)
Neutrophils Relative %: 94 %
Platelet Count: 339 10*3/uL (ref 150–400)
RBC: 2.85 MIL/uL — ABNORMAL LOW (ref 4.22–5.81)
RDW: 17 % — ABNORMAL HIGH (ref 11.5–15.5)
WBC Count: 8.4 10*3/uL (ref 4.0–10.5)
nRBC: 33.7 % — ABNORMAL HIGH (ref 0.0–0.2)

## 2018-07-27 MED ORDER — BORTEZOMIB CHEMO SQ INJECTION 3.5 MG (2.5MG/ML)
1.5000 mg/m2 | Freq: Once | INTRAMUSCULAR | Status: AC
  Administered 2018-07-27: 3.25 mg via SUBCUTANEOUS
  Filled 2018-07-27: qty 1.3

## 2018-07-27 MED ORDER — PROCHLORPERAZINE MALEATE 10 MG PO TABS: 10.0000 mg | ORAL_TABLET | Freq: Once | ORAL | Status: DC

## 2018-07-27 MED ORDER — PROCHLORPERAZINE MALEATE 10 MG PO TABS
ORAL_TABLET | ORAL | Status: AC
  Filled 2018-07-27: qty 1

## 2018-07-27 NOTE — Progress Notes (Signed)
Notified Dr. Alen Blew of critical lab value (Scr 8.42). Per Dr. Alen Blew, ok to treat.

## 2018-07-27 NOTE — Patient Instructions (Signed)
[facility] Discharge Instructions for Patients Receiving Chemotherapy  Today you received the following chemotherapy agents: Bortezomib (Velcade)  To help prevent nausea and vomiting after your treatment, we encourage you to take your nausea medication as directed.    If you develop nausea and vomiting that is not controlled by your nausea medication, call the clinic.   BELOW ARE SYMPTOMS THAT SHOULD BE REPORTED IMMEDIATELY:  *FEVER GREATER THAN 100.5 F  *CHILLS WITH OR WITHOUT FEVER  NAUSEA AND VOMITING THAT IS NOT CONTROLLED WITH YOUR NAUSEA MEDICATION  *UNUSUAL SHORTNESS OF BREATH  *UNUSUAL BRUISING OR BLEEDING  TENDERNESS IN MOUTH AND THROAT WITH OR WITHOUT PRESENCE OF ULCERS  *URINARY PROBLEMS  *BOWEL PROBLEMS  UNUSUAL RASH Items with * indicate a potential emergency and should be followed up as soon as possible.  Feel free to call the clinic should you have any questions or concerns. The clinic phone number is [phone].  Please show the [facility] at check-in to the Emergency Department and triage nurse.

## 2018-07-28 DIAGNOSIS — N186 End stage renal disease: Secondary | ICD-10-CM | POA: Diagnosis not present

## 2018-07-28 DIAGNOSIS — D509 Iron deficiency anemia, unspecified: Secondary | ICD-10-CM | POA: Diagnosis not present

## 2018-07-28 DIAGNOSIS — N2581 Secondary hyperparathyroidism of renal origin: Secondary | ICD-10-CM | POA: Diagnosis not present

## 2018-07-28 DIAGNOSIS — D631 Anemia in chronic kidney disease: Secondary | ICD-10-CM | POA: Diagnosis not present

## 2018-07-28 DIAGNOSIS — E1129 Type 2 diabetes mellitus with other diabetic kidney complication: Secondary | ICD-10-CM | POA: Diagnosis not present

## 2018-07-30 DIAGNOSIS — D509 Iron deficiency anemia, unspecified: Secondary | ICD-10-CM | POA: Diagnosis not present

## 2018-07-30 DIAGNOSIS — E1129 Type 2 diabetes mellitus with other diabetic kidney complication: Secondary | ICD-10-CM | POA: Diagnosis not present

## 2018-07-30 DIAGNOSIS — D631 Anemia in chronic kidney disease: Secondary | ICD-10-CM | POA: Diagnosis not present

## 2018-07-30 DIAGNOSIS — N2581 Secondary hyperparathyroidism of renal origin: Secondary | ICD-10-CM | POA: Diagnosis not present

## 2018-07-30 DIAGNOSIS — N186 End stage renal disease: Secondary | ICD-10-CM | POA: Diagnosis not present

## 2018-08-01 DIAGNOSIS — N186 End stage renal disease: Secondary | ICD-10-CM | POA: Diagnosis not present

## 2018-08-01 DIAGNOSIS — D631 Anemia in chronic kidney disease: Secondary | ICD-10-CM | POA: Diagnosis not present

## 2018-08-01 DIAGNOSIS — N2581 Secondary hyperparathyroidism of renal origin: Secondary | ICD-10-CM | POA: Diagnosis not present

## 2018-08-01 DIAGNOSIS — E1129 Type 2 diabetes mellitus with other diabetic kidney complication: Secondary | ICD-10-CM | POA: Diagnosis not present

## 2018-08-01 DIAGNOSIS — D509 Iron deficiency anemia, unspecified: Secondary | ICD-10-CM | POA: Diagnosis not present

## 2018-08-02 ENCOUNTER — Telehealth: Payer: Self-pay

## 2018-08-02 NOTE — Telephone Encounter (Signed)
Patient spouse called stating that patient will have dialysis on Tues, 11/26, due to holiday week. Patient takes oral Cytoxan Q Tuesday and she wanted to know if can take prior to dialysis or if needs to hold off till after dialysis. Confirmed with [facility] pharmacist that patient should take the oral Cytoxan after dialysis tomorrow. Called wife back and made aware, she verbalized understanding and had no other questions or concerns.

## 2018-08-03 ENCOUNTER — Inpatient Hospital Stay: Payer: Medicare Other

## 2018-08-03 VITALS — BP 112/66 | HR 79 | Temp 97.8°F | Resp 16

## 2018-08-03 DIAGNOSIS — C9000 Multiple myeloma not having achieved remission: Secondary | ICD-10-CM

## 2018-08-03 DIAGNOSIS — D472 Monoclonal gammopathy: Secondary | ICD-10-CM

## 2018-08-03 DIAGNOSIS — D509 Iron deficiency anemia, unspecified: Secondary | ICD-10-CM | POA: Diagnosis not present

## 2018-08-03 DIAGNOSIS — Z5112 Encounter for antineoplastic immunotherapy: Secondary | ICD-10-CM | POA: Diagnosis not present

## 2018-08-03 DIAGNOSIS — N186 End stage renal disease: Secondary | ICD-10-CM | POA: Diagnosis not present

## 2018-08-03 DIAGNOSIS — E1129 Type 2 diabetes mellitus with other diabetic kidney complication: Secondary | ICD-10-CM | POA: Diagnosis not present

## 2018-08-03 DIAGNOSIS — D631 Anemia in chronic kidney disease: Secondary | ICD-10-CM | POA: Diagnosis not present

## 2018-08-03 DIAGNOSIS — N2581 Secondary hyperparathyroidism of renal origin: Secondary | ICD-10-CM | POA: Diagnosis not present

## 2018-08-03 LAB — CMP (CANCER CENTER ONLY)
ALT: 6 U/L (ref 0–44)
AST: 14 U/L — ABNORMAL LOW (ref 15–41)
Albumin: 3.8 g/dL (ref 3.5–5.0)
Alkaline Phosphatase: 71 U/L (ref 38–126)
Anion gap: 11 (ref 5–15)
BUN: 12 mg/dL (ref 6–20)
CO2: 32 mmol/L (ref 22–32)
Calcium: 8.5 mg/dL — ABNORMAL LOW (ref 8.9–10.3)
Chloride: 97 mmol/L — ABNORMAL LOW (ref 98–111)
Creatinine: 4.49 mg/dL (ref 0.61–1.24)
GFR, Est AFR Am: 16 mL/min — ABNORMAL LOW (ref >60)
GFR, Estimated: 14 mL/min — ABNORMAL LOW (ref >60)
Glucose, Bld: 100 mg/dL — ABNORMAL HIGH (ref 70–99)
Potassium: 3.1 mmol/L — ABNORMAL LOW (ref 3.5–5.1)
Sodium: 140 mmol/L (ref 135–145)
Total Bilirubin: 1.2 mg/dL (ref 0.3–1.2)
Total Protein: 8.1 g/dL (ref 6.5–8.1)

## 2018-08-03 LAB — CBC WITH DIFFERENTIAL (CANCER CENTER ONLY)
Abs Immature Granulocytes: 0.04 10*3/uL (ref 0.00–0.07)
Basophils Absolute: 0.1 10*3/uL (ref 0.0–0.1)
Basophils Relative: 2 %
Eosinophils Absolute: 0.4 10*3/uL (ref 0.0–0.5)
Eosinophils Relative: 4 %
HCT: 25.3 % — ABNORMAL LOW (ref 39.0–52.0)
Hemoglobin: 9.5 g/dL — ABNORMAL LOW (ref 13.0–17.0)
Immature Granulocytes: 1 %
Lymphocytes Relative: 11 %
Lymphs Abs: 0.9 10*3/uL (ref 0.7–4.0)
MCH: 34.8 pg — ABNORMAL HIGH (ref 26.0–34.0)
MCHC: 37.5 g/dL — ABNORMAL HIGH (ref 30.0–36.0)
MCV: 92.7 fL (ref 80.0–100.0)
Monocytes Absolute: 1.3 10*3/uL — ABNORMAL HIGH (ref 0.1–1.0)
Monocytes Relative: 16 %
Neutro Abs: 5.5 10*3/uL (ref 1.7–7.7)
Neutrophils Relative %: 66 %
Platelet Count: 263 10*3/uL (ref 150–400)
RBC: 2.73 MIL/uL — ABNORMAL LOW (ref 4.22–5.81)
RDW: 15.7 % — ABNORMAL HIGH (ref 11.5–15.5)
WBC Count: 8.2 10*3/uL (ref 4.0–10.5)
nRBC: 26 % — ABNORMAL HIGH (ref 0.0–0.2)

## 2018-08-03 MED ORDER — BORTEZOMIB CHEMO SQ INJECTION 3.5 MG (2.5MG/ML)
1.5000 mg/m2 | Freq: Once | INTRAMUSCULAR | Status: AC
  Administered 2018-08-03: 3.25 mg via SUBCUTANEOUS
  Filled 2018-08-03: qty 1.3

## 2018-08-03 MED ORDER — PROCHLORPERAZINE MALEATE 10 MG PO TABS: 10.0000 mg | ORAL_TABLET | Freq: Once | ORAL | Status: DC

## 2018-08-03 NOTE — Patient Instructions (Signed)
[facility] Discharge Instructions for Patients Receiving Chemotherapy  Today you received the following chemotherapy agents:  Velcade.  To help prevent nausea and vomiting after your treatment, we encourage you to take your nausea medication as directed.   If you develop nausea and vomiting that is not controlled by your nausea medication, call the clinic.   BELOW ARE SYMPTOMS THAT SHOULD BE REPORTED IMMEDIATELY:  *FEVER GREATER THAN 100.5 F  *CHILLS WITH OR WITHOUT FEVER  NAUSEA AND VOMITING THAT IS NOT CONTROLLED WITH YOUR NAUSEA MEDICATION  *UNUSUAL SHORTNESS OF BREATH  *UNUSUAL BRUISING OR BLEEDING  TENDERNESS IN MOUTH AND THROAT WITH OR WITHOUT PRESENCE OF ULCERS  *URINARY PROBLEMS  *BOWEL PROBLEMS  UNUSUAL RASH Items with * indicate a potential emergency and should be followed up as soon as possible.  Feel free to call the clinic should you have any questions or concerns. The clinic phone number is [phone].  Please show the [facility] at check-in to the Emergency Department and triage nurse.

## 2018-08-03 NOTE — Progress Notes (Signed)
Per 06/29/2018 note and treatment parameters, treat regardless of creatinine.

## 2018-08-04 LAB — KAPPA/LAMBDA LIGHT CHAINS
Kappa free light chain: 403.8 mg/L — ABNORMAL HIGH (ref 3.3–19.4)
Kappa, lambda light chain ratio: 7.65 — ABNORMAL HIGH (ref 0.26–1.65)
Lambda free light chains: 52.8 mg/L — ABNORMAL HIGH (ref 5.7–26.3)

## 2018-08-06 DIAGNOSIS — N2581 Secondary hyperparathyroidism of renal origin: Secondary | ICD-10-CM | POA: Diagnosis not present

## 2018-08-06 DIAGNOSIS — D509 Iron deficiency anemia, unspecified: Secondary | ICD-10-CM | POA: Diagnosis not present

## 2018-08-06 DIAGNOSIS — E1129 Type 2 diabetes mellitus with other diabetic kidney complication: Secondary | ICD-10-CM | POA: Diagnosis not present

## 2018-08-06 DIAGNOSIS — D631 Anemia in chronic kidney disease: Secondary | ICD-10-CM | POA: Diagnosis not present

## 2018-08-06 DIAGNOSIS — N186 End stage renal disease: Secondary | ICD-10-CM | POA: Diagnosis not present

## 2018-08-07 DIAGNOSIS — E1122 Type 2 diabetes mellitus with diabetic chronic kidney disease: Secondary | ICD-10-CM | POA: Diagnosis not present

## 2018-08-07 DIAGNOSIS — N186 End stage renal disease: Secondary | ICD-10-CM | POA: Diagnosis not present

## 2018-08-07 DIAGNOSIS — Z992 Dependence on renal dialysis: Secondary | ICD-10-CM | POA: Diagnosis not present

## 2018-08-09 DIAGNOSIS — E1129 Type 2 diabetes mellitus with other diabetic kidney complication: Secondary | ICD-10-CM | POA: Diagnosis not present

## 2018-08-09 DIAGNOSIS — N2581 Secondary hyperparathyroidism of renal origin: Secondary | ICD-10-CM | POA: Diagnosis not present

## 2018-08-09 DIAGNOSIS — N186 End stage renal disease: Secondary | ICD-10-CM | POA: Diagnosis not present

## 2018-08-09 DIAGNOSIS — D509 Iron deficiency anemia, unspecified: Secondary | ICD-10-CM | POA: Diagnosis not present

## 2018-08-09 DIAGNOSIS — D631 Anemia in chronic kidney disease: Secondary | ICD-10-CM | POA: Diagnosis not present

## 2018-08-10 ENCOUNTER — Inpatient Hospital Stay (HOSPITAL_BASED_OUTPATIENT_CLINIC_OR_DEPARTMENT_OTHER): Payer: Medicare Other | Admitting: Oncology

## 2018-08-10 ENCOUNTER — Inpatient Hospital Stay: Payer: Medicare Other | Attending: Oncology

## 2018-08-10 ENCOUNTER — Inpatient Hospital Stay: Payer: Medicare Other

## 2018-08-10 VITALS — BP 150/84 | HR 71 | Temp 98.0°F | Resp 18 | Ht 67.0 in | Wt 216.4 lb

## 2018-08-10 DIAGNOSIS — E1122 Type 2 diabetes mellitus with diabetic chronic kidney disease: Secondary | ICD-10-CM

## 2018-08-10 DIAGNOSIS — Z5112 Encounter for antineoplastic immunotherapy: Secondary | ICD-10-CM | POA: Diagnosis not present

## 2018-08-10 DIAGNOSIS — C9000 Multiple myeloma not having achieved remission: Secondary | ICD-10-CM | POA: Insufficient documentation

## 2018-08-10 DIAGNOSIS — N189 Chronic kidney disease, unspecified: Secondary | ICD-10-CM

## 2018-08-10 DIAGNOSIS — Z992 Dependence on renal dialysis: Secondary | ICD-10-CM | POA: Diagnosis not present

## 2018-08-10 DIAGNOSIS — I129 Hypertensive chronic kidney disease with stage 1 through stage 4 chronic kidney disease, or unspecified chronic kidney disease: Secondary | ICD-10-CM

## 2018-08-10 DIAGNOSIS — D472 Monoclonal gammopathy: Secondary | ICD-10-CM

## 2018-08-10 LAB — CMP (CANCER CENTER ONLY)
ALT: <6 U/L (ref 0–44)
AST: 12 U/L — ABNORMAL LOW (ref 15–41)
Albumin: 4 g/dL (ref 3.5–5.0)
Alkaline Phosphatase: 82 U/L (ref 38–126)
Anion gap: 10 (ref 5–15)
BUN: 29 mg/dL — ABNORMAL HIGH (ref 6–20)
CO2: 33 mmol/L — ABNORMAL HIGH (ref 22–32)
Calcium: 9.8 mg/dL (ref 8.9–10.3)
Chloride: 96 mmol/L — ABNORMAL LOW (ref 98–111)
Creatinine: 9.38 mg/dL (ref 0.61–1.24)
GFR, Est AFR Am: 7 mL/min — ABNORMAL LOW (ref >60)
GFR, Estimated: 6 mL/min — ABNORMAL LOW (ref >60)
Glucose, Bld: 182 mg/dL — ABNORMAL HIGH (ref 70–99)
Potassium: 4.6 mmol/L (ref 3.5–5.1)
Sodium: 139 mmol/L (ref 135–145)
Total Bilirubin: 1 mg/dL (ref 0.3–1.2)
Total Protein: 8.5 g/dL — ABNORMAL HIGH (ref 6.5–8.1)

## 2018-08-10 LAB — CBC WITH DIFFERENTIAL (CANCER CENTER ONLY)
Abs Immature Granulocytes: 0.05 10*3/uL (ref 0.00–0.07)
Basophils Absolute: 0.1 10*3/uL (ref 0.0–0.1)
Basophils Relative: 1 %
Eosinophils Absolute: 0 10*3/uL (ref 0.0–0.5)
Eosinophils Relative: 0 %
HCT: 29.7 % — ABNORMAL LOW (ref 39.0–52.0)
Hemoglobin: 11 g/dL — ABNORMAL LOW (ref 13.0–17.0)
Immature Granulocytes: 1 %
Lymphocytes Relative: 5 %
Lymphs Abs: 0.3 10*3/uL — ABNORMAL LOW (ref 0.7–4.0)
MCH: 34.3 pg — ABNORMAL HIGH (ref 26.0–34.0)
MCHC: 37 g/dL — ABNORMAL HIGH (ref 30.0–36.0)
MCV: 92.5 fL (ref 80.0–100.0)
Monocytes Absolute: 0.1 10*3/uL (ref 0.1–1.0)
Monocytes Relative: 2 %
Neutro Abs: 7 10*3/uL (ref 1.7–7.7)
Neutrophils Relative %: 91 %
Platelet Count: 309 10*3/uL (ref 150–400)
RBC: 3.21 MIL/uL — ABNORMAL LOW (ref 4.22–5.81)
RDW: 16.4 % — ABNORMAL HIGH (ref 11.5–15.5)
WBC Count: 3 10*3/uL — ABNORMAL LOW (ref 4.0–10.5)
nRBC: 155 % — ABNORMAL HIGH (ref 0.0–0.2)

## 2018-08-10 LAB — MULTIPLE MYELOMA PANEL, SERUM
Albumin SerPl Elph-Mcnc: 4.1 g/dL (ref 2.9–4.4)
Albumin/Glob SerPl: 1.2 (ref 0.7–1.7)
Alpha 1: 0.2 g/dL (ref 0.0–0.4)
Alpha2 Glob SerPl Elph-Mcnc: 0.5 g/dL (ref 0.4–1.0)
B-Globulin SerPl Elph-Mcnc: 0.7 g/dL (ref 0.7–1.3)
Gamma Glob SerPl Elph-Mcnc: 2.3 g/dL — ABNORMAL HIGH (ref 0.4–1.8)
Globulin, Total: 3.7 g/dL (ref 2.2–3.9)
IgA: 36 mg/dL — ABNORMAL LOW (ref 90–386)
IgG (Immunoglobin G), Serum: 2590 mg/dL — ABNORMAL HIGH (ref 700–1600)
IgM (Immunoglobulin M), Srm: 17 mg/dL — ABNORMAL LOW (ref 20–172)
M Protein SerPl Elph-Mcnc: 1.9 g/dL — ABNORMAL HIGH
Total Protein ELP: 7.8 g/dL (ref 6.0–8.5)

## 2018-08-10 MED ORDER — PROCHLORPERAZINE MALEATE 10 MG PO TABS: 10.0000 mg | ORAL_TABLET | Freq: Once | ORAL | Status: DC

## 2018-08-10 MED ORDER — BORTEZOMIB CHEMO SQ INJECTION 3.5 MG (2.5MG/ML)
1.5000 mg/m2 | Freq: Once | INTRAMUSCULAR | Status: AC
  Administered 2018-08-10: 3.25 mg via SUBCUTANEOUS
  Filled 2018-08-10: qty 1.3

## 2018-08-10 NOTE — Patient Instructions (Signed)
[facility] Discharge Instructions for Patients Receiving Chemotherapy  Today you received the following chemotherapy agent:  Velcade.  To help prevent nausea and vomiting after your treatment, we encourage you to take your nausea medication as directed.   If you develop nausea and vomiting that is not controlled by your nausea medication, call the clinic.   BELOW ARE SYMPTOMS THAT SHOULD BE REPORTED IMMEDIATELY:  *FEVER GREATER THAN 100.5 F  *CHILLS WITH OR WITHOUT FEVER  NAUSEA AND VOMITING THAT IS NOT CONTROLLED WITH YOUR NAUSEA MEDICATION  *UNUSUAL SHORTNESS OF BREATH  *UNUSUAL BRUISING OR BLEEDING  TENDERNESS IN MOUTH AND THROAT WITH OR WITHOUT PRESENCE OF ULCERS  *URINARY PROBLEMS  *BOWEL PROBLEMS  UNUSUAL RASH Items with * indicate a potential emergency and should be followed up as soon as possible.  Feel free to call the clinic should you have any questions or concerns. The clinic phone number is [phone].  Please show the [place] at check-in to the Emergency Department and triage nurse.

## 2018-08-10 NOTE — Progress Notes (Signed)
Hematology and Oncology Follow Up Visit  [place] [id number] 12-Aug-1966 52 y.o. 08/10/2018 12:16 PM    Principle Diagnosis: 52 year old man with  1.  IgG kappa multiple myeloma arising from a smoldering myeloma 2012 for symptomatic myeloma in September 2019. He was found to have 20% plasma cell involvement in the bone marrow at that time.   2.  Renal failure: Related to long-standing hypertension.  Under evaluation for renal transplant.  Current therapy: Velcade dexamethasone and cyclophosphamide weekly started on 05/25/2018.  Velcade is given at 1.5 mg/m weekly, dexamethasone is 20 mg weekly and and Cytoxan is given at 650 mg total dose.   Interim History:  Isaiah [name] returns today for a repeat evaluation.  Since the last visit, he reports no major complaints.  He continues to tolerate therapy without any side effects.  He denies any nausea, fatigue or infusion related complications.  He denies any bone pain or recurrent infections.  He denies any worsening neuropathy.  Continues to tolerate dialysis with treatment without any issues.  Denies any recent hospitalizations or illnesses.  He does not report any headaches, blurry vision, syncope.  He does not report any confusion or dizziness.[name]  He does not report any fevers, chills, sweats or weight change. He does not report any  palpitation, orthopnea or leg edema. Does not report any cough or hemoptysis or hematemesis. He does not report any nausea, vomiting.  He denies any changes in bowel habits.  He does not report any skin rashes or lesions.  He denies any ecchymosis or petechiae.  Denies any lymphadenopathy or easy bruising.  Denies any anxiety or depression.  He denies any heat or cold intolerance.  Rest of his review of systems is negative.   Medications: I have reviewed the patient's current medications.  Current Outpatient Medications:  .  acyclovir (ZOVIRAX) 400 MG tablet, TAKE 1 TABLET (400 MG TOTAL) BY MOUTH DAILY., Disp: 90  tablet, Rfl: 0 .  albuterol (PROAIR HFA) 108 (90 Base) MCG/ACT inhaler, Inhale two puffs every 4-6 hours if needed for cough or wheeze, Disp: 1 Inhaler, Rfl: 1 .  aspirin EC 81 MG tablet, Take 1 tablet (81 mg total) by mouth daily., Disp: 100 tablet, Rfl: 0 .  calcium acetate (PHOSLO) 667 MG capsule, Take 2,001 mg by mouth 3 (three) times daily with meals. , Disp: , Rfl:  .  Cetirizine HCl 10 MG CAPS, Take 1 capsule by mouth daily., Disp: , Rfl:  .  cinacalcet (SENSIPAR) 60 MG tablet, Take 60 mg by mouth every other day. , Disp: , Rfl:  .  clonazePAM (KLONOPIN) 0.5 MG tablet, Take 1 tablet prior to each dialysis session, Disp: 15 tablet, Rfl: 5 .  cyclophosphamide (CYTOXAN) 50 MG capsule, Take 13 capsules (683m) by mouth once weekly, take early in the day, Disp: 52 capsule, Rfl: 3 .  dexamethasone (DECADRON) 4 MG tablet, Take 5 tablets weekly, Disp: 90 tablet, Rfl: 3 .  fluticasone furoate-vilanterol (BREO ELLIPTA) 100-25 MCG/INH AEPB, Inhale one puff once daily to prevent cough or wheeze, Disp: 60 each, Rfl: 5 .  insulin lispro (HUMALOG) 100 UNIT/ML injection, Inject into the skin 3 (three) times daily before meals. 3 to 4 units TID prn SS, Disp: , Rfl:  .  Lacosamide (VIMPAT) 150 MG TABS, Take 1 tablet twice a day. Take an additional 1 tablet after dialysis session three times a week., Disp: 75 tablet, Rfl: 5 .  lamoTRIgine (LAMICTAL) 150 MG tablet, Take 1 tablet (150 mg  total) by mouth 2 (two) times daily., Disp: 180 tablet, Rfl: 3 .  metoprolol tartrate (LOPRESSOR) 25 MG tablet, Take 0.5 tablets (12.5 mg total) by mouth 2 (two) times daily., Disp: 60 tablet, Rfl: 0 .  midodrine (PROAMATINE) 10 MG tablet, Take 10 mg by mouth 2 (two) times daily with a meal. , Disp: , Rfl:  .  multivitamin (RENA-VIT) TABS tablet, Take 1 tablet by mouth at bedtime., Disp: 30 tablet, Rfl: 1 .  pantoprazole (PROTONIX) 40 MG tablet, Take 1 tablet (40 mg total) by mouth 2 (two) times daily. (Patient taking differently:  Take 40 mg by mouth every morning. ), Disp: 60 tablet, Rfl: 1 .  prochlorperazine (COMPAZINE) 10 MG tablet, Take 1 tablet (10 mg total) by mouth every 6 (six) hours as needed for nausea or vomiting., Disp: 30 tablet, Rfl: 0  Allergies:  Allergies  Allergen Reactions  . Codeine Rash    Unknown reaction (patient says it was more serious than just a rash, but he can't remember what happened)    Past Medical History, Surgical history, Social history, and Family History were reviewed and updated.  Physical Exam:  Blood pressure (!) 150/84, pulse 71, temperature 98 F (36.7 C), temperature source Oral, resp. rate 18, height _0  (1.702 m), weight 216 lb 6.4 oz (98.2 kg), SpO2 94 %.     ECOG: 1   General appearance: Alert, awake without any distress. Head: Atraumatic without abnormalities Oropharynx: Without any thrush or ulcers. Eyes: No scleral icterus. Lymph nodes: No lymphadenopathy noted in the cervical, supraclavicular, or axillary nodes Heart:regular rate and rhythm, without any murmurs or gallops.   Lung: Clear to auscultation without any rhonchi, wheezes or dullness to percussion. Abdomin: Soft, nontender without any shifting dullness or ascites. Musculoskeletal: No clubbing or cyanosis. Neurological: No motor or sensory deficits. Skin: No rashes or lesions. Psychiatric: Mood and affect appeared normal.      Lab Results: Lab Results  Component Value Date   WBC 8.2 08/03/2018   HGB 9.5 (L) 08/03/2018   HCT 25.3 (L) 08/03/2018   MCV 92.7 08/03/2018   PLT 263 08/03/2018     Chemistry      Component Value Date/Time   NA 140 08/03/2018 1236   NA 138 07/03/2017 0813   K 3.1 (L) 08/03/2018 1236   K 4.2 07/03/2017 0813   CL 97 (L) 08/03/2018 1236   CO2 32 08/03/2018 1236   CO2 27 07/03/2017 0813   BUN 12 08/03/2018 1236   BUN 46.4 (H) 07/03/2017 0813   CREATININE 4.49 (HH) 08/03/2018 1236   CREATININE 10.9 (HH) 07/03/2017 0813      Component Value Date/Time    CALCIUM 8.5 (L) 08/03/2018 1236   CALCIUM 9.3 07/03/2017 0813   ALKPHOS 71 08/03/2018 1236   ALKPHOS 77 07/03/2017 0813   AST 14 (L) 08/03/2018 1236   AST 16 07/03/2017 0813   ALT 6 08/03/2018 1236   ALT 14 07/03/2017 0813   BILITOT 1.2 08/03/2018 1236   BILITOT 0.93 07/03/2017 0813      Results for Isaiah [name], Isaiah [name] (MRN [id number]) as of 08/10/2018 12:19  Ref. Range 06/29/2018 11:32 08/03/2018 12:36  Kappa free light chain Latest Ref Range: 3.3 - 19.4 mg/L 420.7 (H) 403.8 (H)  Lamda free light chains Latest Ref Range: 5.7 - 26.3 mg/L 56.3 (H) 52.8 (H)  Kappa, lamda light chain ratio Latest Ref Range: 0.26 - 1.65  7.47 (H) 7.65 (H)       Impression and  Plan:  52 year old man with  1.  IgG kappa multiple myeloma arising from a smoldering myeloma in 2012 to symptomatic myeloma 2019.   He remains on Velcade, dexamethasone and Cytoxan which she has tolerated reasonably well.  Protein studies obtained on 08/03/2018 continue to show positive response to therapy.  His serum light chain indicate continued positive response.  Risks and benefits of continuing this approach was discussed and is agreeable to continue.  Plan is to tentatively complete 6 months of therapy to achieve complete response or close to it.     2. Anemia: His hemoglobin remains adequate without any transfusion needs.  We will continue to monitor her future visits.  3. Renal failure: Remains on hemodialysis without any issues.  4.  Zoster prophylaxis: He is currently on acyclovir without any reactivation.  5.  Diabetes: Blood sugar appears adequate at this time without any issues.  6.  Nausea prophylaxis: Controlled at this time without any nausea or vomiting.  7.  Goals of care: Therapy overall is palliative although his performance status is excellent and aggressive therapy is warranted.  8. Follow-up: Weekly for Velcade and MD follow-up in 4 weeks.  25  minutes was spent with the patient face-to-face  today.  More than 50% of time was dedicated to reviewing his disease status, treatment options and coordinating plan of care.     Zola Button, MD 12/3/201912:16 PM

## 2018-08-11 ENCOUNTER — Telehealth: Payer: Self-pay | Admitting: *Deleted

## 2018-08-11 DIAGNOSIS — D509 Iron deficiency anemia, unspecified: Secondary | ICD-10-CM | POA: Diagnosis not present

## 2018-08-11 DIAGNOSIS — N186 End stage renal disease: Secondary | ICD-10-CM | POA: Diagnosis not present

## 2018-08-11 DIAGNOSIS — D631 Anemia in chronic kidney disease: Secondary | ICD-10-CM | POA: Diagnosis not present

## 2018-08-11 DIAGNOSIS — N2581 Secondary hyperparathyroidism of renal origin: Secondary | ICD-10-CM | POA: Diagnosis not present

## 2018-08-11 DIAGNOSIS — E1129 Type 2 diabetes mellitus with other diabetic kidney complication: Secondary | ICD-10-CM | POA: Diagnosis not present

## 2018-08-11 NOTE — Telephone Encounter (Signed)
-----   Message from Wyatt Portela, MD sent at 08/10/2018  3:46 PM EST ----- Please let him know that his protein level is at 1.9. Down from 2.9 last month.

## 2018-08-11 NOTE — Telephone Encounter (Signed)
Per dr Alen Blew, Spoke with [name], gave results of last labs. Protein down from 2.9 to 1.9.

## 2018-08-12 MED FILL — CYCLOPHOSPHAMIDE 50 MG CAP: 50 | 28 days supply | Qty: 52 | Fill #3

## 2018-08-12 MED FILL — VIMPAT 150 MG TABLET: 150 | 30 days supply | Qty: 75 | Fill #0

## 2018-08-13 ENCOUNTER — Telehealth: Payer: Self-pay

## 2018-08-13 ENCOUNTER — Telehealth: Payer: Self-pay | Admitting: Oncology

## 2018-08-13 DIAGNOSIS — E1129 Type 2 diabetes mellitus with other diabetic kidney complication: Secondary | ICD-10-CM | POA: Diagnosis not present

## 2018-08-13 DIAGNOSIS — N2581 Secondary hyperparathyroidism of renal origin: Secondary | ICD-10-CM | POA: Diagnosis not present

## 2018-08-13 DIAGNOSIS — D631 Anemia in chronic kidney disease: Secondary | ICD-10-CM | POA: Diagnosis not present

## 2018-08-13 DIAGNOSIS — N186 End stage renal disease: Secondary | ICD-10-CM | POA: Diagnosis not present

## 2018-08-13 DIAGNOSIS — D509 Iron deficiency anemia, unspecified: Secondary | ICD-10-CM | POA: Diagnosis not present

## 2018-08-13 NOTE — Telephone Encounter (Signed)
Patient was to come in and pick up added appointment. Per 12/3 los

## 2018-08-13 NOTE — Telephone Encounter (Signed)
Patient spouse came and got a print out of the appointments for Jan 2020.

## 2018-08-16 DIAGNOSIS — E1129 Type 2 diabetes mellitus with other diabetic kidney complication: Secondary | ICD-10-CM | POA: Diagnosis not present

## 2018-08-16 DIAGNOSIS — N186 End stage renal disease: Secondary | ICD-10-CM | POA: Diagnosis not present

## 2018-08-16 DIAGNOSIS — D509 Iron deficiency anemia, unspecified: Secondary | ICD-10-CM | POA: Diagnosis not present

## 2018-08-16 DIAGNOSIS — N2581 Secondary hyperparathyroidism of renal origin: Secondary | ICD-10-CM | POA: Diagnosis not present

## 2018-08-16 DIAGNOSIS — D631 Anemia in chronic kidney disease: Secondary | ICD-10-CM | POA: Diagnosis not present

## 2018-08-17 ENCOUNTER — Inpatient Hospital Stay: Payer: Medicare Other

## 2018-08-17 ENCOUNTER — Telehealth: Payer: Self-pay | Admitting: *Deleted

## 2018-08-17 VITALS — BP 144/73 | HR 72 | Temp 98.2°F | Resp 18

## 2018-08-17 DIAGNOSIS — C9000 Multiple myeloma not having achieved remission: Secondary | ICD-10-CM | POA: Diagnosis not present

## 2018-08-17 DIAGNOSIS — Z5112 Encounter for antineoplastic immunotherapy: Secondary | ICD-10-CM | POA: Diagnosis not present

## 2018-08-17 DIAGNOSIS — D472 Monoclonal gammopathy: Secondary | ICD-10-CM

## 2018-08-17 LAB — CMP (CANCER CENTER ONLY)
ALT: <6 U/L (ref 0–44)
AST: 14 U/L — ABNORMAL LOW (ref 15–41)
Albumin: 3.9 g/dL (ref 3.5–5.0)
Alkaline Phosphatase: 75 U/L (ref 38–126)
Anion gap: 11 (ref 5–15)
BUN: 30 mg/dL — ABNORMAL HIGH (ref 6–20)
CO2: 31 mmol/L (ref 22–32)
Calcium: 9.6 mg/dL (ref 8.9–10.3)
Chloride: 99 mmol/L (ref 98–111)
Creatinine: 8.88 mg/dL (ref 0.61–1.24)
GFR, Est AFR Am: 7 mL/min — ABNORMAL LOW (ref >60)
GFR, Estimated: 6 mL/min — ABNORMAL LOW (ref >60)
Glucose, Bld: 205 mg/dL — ABNORMAL HIGH (ref 70–99)
Potassium: 4.8 mmol/L (ref 3.5–5.1)
Sodium: 141 mmol/L (ref 135–145)
Total Bilirubin: 1 mg/dL (ref 0.3–1.2)
Total Protein: 8.2 g/dL — ABNORMAL HIGH (ref 6.5–8.1)

## 2018-08-17 LAB — CBC WITH DIFFERENTIAL (CANCER CENTER ONLY)
Basophils Absolute: 0 10*3/uL (ref 0.0–0.1)
Basophils Relative: 0 %
Eosinophils Absolute: 0 10*3/uL (ref 0.0–0.5)
Eosinophils Relative: 0 %
HCT: 28.1 % — ABNORMAL LOW (ref 39.0–52.0)
Hemoglobin: 10.6 g/dL — ABNORMAL LOW (ref 13.0–17.0)
Lymphocytes Relative: 3 %
Lymphs Abs: 0.2 10*3/uL — ABNORMAL LOW (ref 0.7–4.0)
MCH: 34.1 pg — ABNORMAL HIGH (ref 26.0–34.0)
MCHC: 37.7 g/dL — ABNORMAL HIGH (ref 30.0–36.0)
MCV: 90.4 fL (ref 80.0–100.0)
Monocytes Absolute: 0.1 10*3/uL (ref 0.1–1.0)
Monocytes Relative: 1 %
Neutro Abs: 7 10*3/uL (ref 1.7–7.7)
Neutrophils Relative %: 95 %
Platelet Count: 285 10*3/uL (ref 150–400)
RBC: 3.11 MIL/uL — ABNORMAL LOW (ref 4.22–5.81)
RDW: 15.7 % — ABNORMAL HIGH (ref 11.5–15.5)
WBC Count: 7.4 10*3/uL (ref 4.0–10.5)
nRBC: 19 % — ABNORMAL HIGH (ref 0.0–0.2)
nRBC: 19 /100 WBC — ABNORMAL HIGH

## 2018-08-17 MED ORDER — PROCHLORPERAZINE MALEATE 10 MG PO TABS: 10.0000 mg | ORAL_TABLET | Freq: Once | ORAL | Status: DC

## 2018-08-17 MED ORDER — BORTEZOMIB CHEMO SQ INJECTION 3.5 MG (2.5MG/ML)
1.5000 mg/m2 | Freq: Once | INTRAMUSCULAR | Status: AC
  Administered 2018-08-17: 3.25 mg via SUBCUTANEOUS
  Filled 2018-08-17: qty 1.3

## 2018-08-17 NOTE — Patient Instructions (Signed)
[facility] Discharge Instructions for Patients Receiving Chemotherapy  Today you received the following chemotherapy agent:  Velcade.  To help prevent nausea and vomiting after your treatment, we encourage you to take your nausea medication as directed.   If you develop nausea and vomiting that is not controlled by your nausea medication, call the clinic.   BELOW ARE SYMPTOMS THAT SHOULD BE REPORTED IMMEDIATELY:  *FEVER GREATER THAN 100.5 F  *CHILLS WITH OR WITHOUT FEVER  NAUSEA AND VOMITING THAT IS NOT CONTROLLED WITH YOUR NAUSEA MEDICATION  *UNUSUAL SHORTNESS OF BREATH  *UNUSUAL BRUISING OR BLEEDING  TENDERNESS IN MOUTH AND THROAT WITH OR WITHOUT PRESENCE OF ULCERS  *URINARY PROBLEMS  *BOWEL PROBLEMS  UNUSUAL RASH Items with * indicate a potential emergency and should be followed up as soon as possible.  Feel free to call the clinic should you have any questions or concerns. The clinic phone number is [phone].  Please show the [facility] at check-in to the Emergency Department and triage nurse.

## 2018-08-17 NOTE — Telephone Encounter (Signed)
Received TC from [facility] in the lab with critical creatinine of 8.88. Pt  scheduled for treatment today. VM message left for Dr. Hazeline Junker nurse and this high priority message.

## 2018-08-18 DIAGNOSIS — N2581 Secondary hyperparathyroidism of renal origin: Secondary | ICD-10-CM | POA: Diagnosis not present

## 2018-08-18 DIAGNOSIS — D509 Iron deficiency anemia, unspecified: Secondary | ICD-10-CM | POA: Diagnosis not present

## 2018-08-18 DIAGNOSIS — E1129 Type 2 diabetes mellitus with other diabetic kidney complication: Secondary | ICD-10-CM | POA: Diagnosis not present

## 2018-08-18 DIAGNOSIS — D631 Anemia in chronic kidney disease: Secondary | ICD-10-CM | POA: Diagnosis not present

## 2018-08-18 DIAGNOSIS — N186 End stage renal disease: Secondary | ICD-10-CM | POA: Diagnosis not present

## 2018-08-18 MED FILL — clonazePAM 0.5 MG TABS: 0.5 | 15 days supply | Qty: 15 | Fill #0

## 2018-08-19 ENCOUNTER — Encounter: Payer: Self-pay | Admitting: Internal Medicine

## 2018-08-19 ENCOUNTER — Ambulatory Visit (INDEPENDENT_AMBULATORY_CARE_PROVIDER_SITE_OTHER): Payer: Medicare Other | Admitting: Internal Medicine

## 2018-08-19 VITALS — BP 126/74 | HR 68 | Temp 98.0°F | Ht 64.0 in | Wt 212.8 lb

## 2018-08-19 DIAGNOSIS — N186 End stage renal disease: Secondary | ICD-10-CM

## 2018-08-19 DIAGNOSIS — Z8673 Personal history of transient ischemic attack (TIA), and cerebral infarction without residual deficits: Secondary | ICD-10-CM | POA: Diagnosis not present

## 2018-08-19 DIAGNOSIS — E1122 Type 2 diabetes mellitus with diabetic chronic kidney disease: Secondary | ICD-10-CM | POA: Diagnosis not present

## 2018-08-19 DIAGNOSIS — Z992 Dependence on renal dialysis: Secondary | ICD-10-CM | POA: Diagnosis not present

## 2018-08-19 DIAGNOSIS — I120 Hypertensive chronic kidney disease with stage 5 chronic kidney disease or end stage renal disease: Secondary | ICD-10-CM

## 2018-08-19 DIAGNOSIS — Z6836 Body mass index (BMI) 36.0-36.9, adult: Secondary | ICD-10-CM

## 2018-08-20 DIAGNOSIS — E1129 Type 2 diabetes mellitus with other diabetic kidney complication: Secondary | ICD-10-CM | POA: Diagnosis not present

## 2018-08-20 DIAGNOSIS — N186 End stage renal disease: Secondary | ICD-10-CM | POA: Diagnosis not present

## 2018-08-20 DIAGNOSIS — D631 Anemia in chronic kidney disease: Secondary | ICD-10-CM | POA: Diagnosis not present

## 2018-08-20 DIAGNOSIS — D509 Iron deficiency anemia, unspecified: Secondary | ICD-10-CM | POA: Diagnosis not present

## 2018-08-20 DIAGNOSIS — N2581 Secondary hyperparathyroidism of renal origin: Secondary | ICD-10-CM | POA: Diagnosis not present

## 2018-08-22 ENCOUNTER — Encounter: Payer: Self-pay | Admitting: Internal Medicine

## 2018-08-22 NOTE — Progress Notes (Signed)
Subjective:     Patient ID: [place] , male    DOB: 12-08-1965 , 52 y.o.   MRN: [id number]   Chief Complaint  Patient presents with  . Diabetes  . Hypertension    HPI  He is no longer on any meds for diabetes. He is controlling this with diet.   Diabetes  He presents for his follow-up diabetic visit. He has type 2 diabetes mellitus. His disease course has been stable. There are no hypoglycemic associated symptoms. Pertinent negatives for diabetes include no blurred vision and no chest pain. There are no hypoglycemic complications. Diabetic complications include nephropathy. Risk factors for coronary artery disease include diabetes mellitus, dyslipidemia, hypertension, male sex and obesity. He is following a diabetic diet.  Hypertension  This is a chronic problem. The current episode started more than 1 year ago. The problem has been gradually improving since onset. The problem is controlled. Pertinent negatives include no anxiety, blurred vision, chest pain or shortness of breath.   He reports compliance with meds.   Past Medical History:  Diagnosis Date  . A-fib ([place])   . Acute renal failure ([place]) 07/2011  . Anemia   . Asthma   . Diabetes mellitus without complication ([place])   . ESRD (end stage renal disease) on dialysis ([place])   . Gout   . History of recent blood transfusion 10/27/14   2 Units PRBC's  . Hyperkalemia 07/2011  . Hypertension   . Monoclonal gammopathies   . Monoclonal gammopathy   . OSA on CPAP   . Pulmonary embolism ([place]) 07/2011   a. Tx with Coumadin for 6 months (unknown cause per patient).  . Pulmonary embolism ([place]) 07/2011; 09/27/2014   a. Bilat PE 07/2011 - unclear cause, tx with 6 months Coumadin.;   . Seizures ([place])   . Sickle cell-thalassemia disease ([place])    a. Sickle cell trait.  . Sleep apnea    a. uses CPAP.  [name] Stroke [facility])      Family History  Problem Relation Age of Onset  . Hypertension Mother   . Diabetes Mother   . Stroke  Father   . Sickle cell anemia Brother   . Diabetes type I Brother   . Kidney disease Brother      Current Outpatient Medications:  .  acyclovir (ZOVIRAX) 400 MG tablet, TAKE 1 TABLET (400 MG TOTAL) BY MOUTH DAILY., Disp: 90 tablet, Rfl: 0 .  albuterol (PROAIR HFA) 108 (90 Base) MCG/ACT inhaler, Inhale two puffs every 4-6 hours if needed for cough or wheeze, Disp: 1 Inhaler, Rfl: 1 .  aspirin EC 81 MG tablet, Take 1 tablet (81 mg total) by mouth daily., Disp: 100 tablet, Rfl: 0 .  calcium acetate (PHOSLO) 667 MG capsule, Take 2,001 mg by mouth 3 (three) times daily with meals. , Disp: , Rfl:  .  Cetirizine HCl 10 MG CAPS, Take 1 capsule by mouth daily., Disp: , Rfl:  .  cinacalcet (SENSIPAR) 60 MG tablet, Take 60 mg by mouth every other day. , Disp: , Rfl:  .  clonazePAM (KLONOPIN) 0.5 MG tablet, Take 1 tablet prior to each dialysis session, Disp: 15 tablet, Rfl: 5 .  cyclophosphamide (CYTOXAN) 50 MG capsule, Take 13 capsules (677m) by mouth once weekly, take early in the day, Disp: 52 capsule, Rfl: 3 .  dexamethasone (DECADRON) 4 MG tablet, Take 5 tablets weekly, Disp: 90 tablet, Rfl: 3 .  fluticasone furoate-vilanterol (BREO ELLIPTA) 100-25 MCG/INH AEPB, Inhale one puff once  daily to prevent cough or wheeze, Disp: 60 each, Rfl: 5 .  insulin lispro (HUMALOG) 100 UNIT/ML injection, Inject into the skin 3 (three) times daily before meals. 3 to 4 units TID prn SS, Disp: , Rfl:  .  Lacosamide (VIMPAT) 150 MG TABS, Take 1 tablet twice a day. Take an additional 1 tablet after dialysis session three times a week., Disp: 75 tablet, Rfl: 5 .  lamoTRIgine (LAMICTAL) 150 MG tablet, Take 1 tablet (150 mg total) by mouth 2 (two) times daily., Disp: 180 tablet, Rfl: 3 .  metoprolol tartrate (LOPRESSOR) 25 MG tablet, Take 0.5 tablets (12.5 mg total) by mouth 2 (two) times daily., Disp: 60 tablet, Rfl: 0 .  midodrine (PROAMATINE) 10 MG tablet, Take 10 mg by mouth 2 (two) times daily with a meal. , Disp: ,  Rfl:  .  multivitamin (RENA-VIT) TABS tablet, Take 1 tablet by mouth at bedtime., Disp: 30 tablet, Rfl: 1 .  pantoprazole (PROTONIX) 40 MG tablet, Take 1 tablet (40 mg total) by mouth 2 (two) times daily. (Patient taking differently: Take 40 mg by mouth every morning. ), Disp: 60 tablet, Rfl: 1 .  prochlorperazine (COMPAZINE) 10 MG tablet, Take 1 tablet (10 mg total) by mouth every 6 (six) hours as needed for nausea or vomiting., Disp: 30 tablet, Rfl: 0   Allergies  Allergen Reactions  . Codeine Rash    Unknown reaction (patient says it was more serious than just a rash, but he can't remember what happened)     Review of Systems  Constitutional: Negative.   HENT: Negative.   Eyes: Negative for blurred vision.  Respiratory: Negative.  Negative for shortness of breath.   Cardiovascular: Negative.  Negative for chest pain.  Gastrointestinal: Negative.   Neurological: Negative.   Psychiatric/Behavioral: Negative.      Today's Vitals   08/19/18 1126  BP: 126/74  Pulse: 68  Temp: 98 F (36.7 C)  TempSrc: Oral  Weight: 212 lb 12.8 oz (96.5 kg)  Height: 5' 4"  (1.626 m)   Body mass index is 36.53 kg/m.   Objective:  Physical Exam Vitals signs and nursing note reviewed.  Constitutional:      Appearance: Normal appearance. He is obese.  HENT:     Head: Normocephalic and atraumatic.  Neck:     Musculoskeletal: Normal range of motion.  Cardiovascular:     Rate and Rhythm: Normal rate and regular rhythm.     Heart sounds: Normal heart sounds.  Pulmonary:     Effort: Pulmonary effort is normal.     Breath sounds: Normal breath sounds.  Skin:    General: Skin is warm.  Neurological:     Mental Status: He is alert.         Assessment And Plan:     1. Diabetes mellitus with end-stage renal disease ([place])  Controlled off of meds. This is diet-controlled. He is congratulated on maintaining great glycemic control with lifestyle changes. He did bring in bloodwork from dialysis.  Last hba1c waas 5.10 June 2018.   2. Benign hypertensive kidney disease with chronic kidney disease stage V or end stage renal disease ([place])  Well controlled. He will continue with current meds. He is encouraged to avoid adding salt to his foods.   3. ESRD (end stage renal disease) on dialysis ([facility])  Chronic. As per Nephrology.   4. History of ischemic left MCA stroke   5. Class 2 severe obesity due to excess calories with serious comorbidity and body  mass index (BMI) of 36.0 to 36.9 in adult [facility])  He was congratulated on his weight loss thus far. He is encouraged to strive for BMI less than 32 (initially). I will continue to follow this.   [name], MD

## 2018-08-23 DIAGNOSIS — N186 End stage renal disease: Secondary | ICD-10-CM | POA: Diagnosis not present

## 2018-08-23 DIAGNOSIS — D631 Anemia in chronic kidney disease: Secondary | ICD-10-CM | POA: Diagnosis not present

## 2018-08-23 DIAGNOSIS — E1129 Type 2 diabetes mellitus with other diabetic kidney complication: Secondary | ICD-10-CM | POA: Diagnosis not present

## 2018-08-23 DIAGNOSIS — D509 Iron deficiency anemia, unspecified: Secondary | ICD-10-CM | POA: Diagnosis not present

## 2018-08-23 DIAGNOSIS — N2581 Secondary hyperparathyroidism of renal origin: Secondary | ICD-10-CM | POA: Diagnosis not present

## 2018-08-24 ENCOUNTER — Inpatient Hospital Stay: Payer: Medicare Other

## 2018-08-24 VITALS — BP 127/61 | HR 75 | Temp 97.8°F | Resp 18

## 2018-08-24 DIAGNOSIS — Z5112 Encounter for antineoplastic immunotherapy: Secondary | ICD-10-CM | POA: Diagnosis not present

## 2018-08-24 DIAGNOSIS — C9000 Multiple myeloma not having achieved remission: Secondary | ICD-10-CM | POA: Diagnosis not present

## 2018-08-24 DIAGNOSIS — D472 Monoclonal gammopathy: Secondary | ICD-10-CM

## 2018-08-24 LAB — CBC WITH DIFFERENTIAL (CANCER CENTER ONLY)
Abs Immature Granulocytes: 0.04 10*3/uL (ref 0.00–0.07)
Basophils Absolute: 0.1 10*3/uL (ref 0.0–0.1)
Basophils Relative: 1 %
Eosinophils Absolute: 0.1 10*3/uL (ref 0.0–0.5)
Eosinophils Relative: 1 %
HCT: 28.6 % — ABNORMAL LOW (ref 39.0–52.0)
Hemoglobin: 10.6 g/dL — ABNORMAL LOW (ref 13.0–17.0)
Immature Granulocytes: 0 %
Lymphocytes Relative: 1 %
Lymphs Abs: 0.1 10*3/uL — ABNORMAL LOW (ref 0.7–4.0)
MCH: 33.1 pg (ref 26.0–34.0)
MCHC: 37.1 g/dL — ABNORMAL HIGH (ref 30.0–36.0)
MCV: 89.4 fL (ref 80.0–100.0)
Monocytes Absolute: 0.3 10*3/uL (ref 0.1–1.0)
Monocytes Relative: 3 %
Neutro Abs: 9.1 10*3/uL — ABNORMAL HIGH (ref 1.7–7.7)
Neutrophils Relative %: 94 %
Platelet Count: 291 10*3/uL (ref 150–400)
RBC: 3.2 MIL/uL — ABNORMAL LOW (ref 4.22–5.81)
RDW: 16.5 % — ABNORMAL HIGH (ref 11.5–15.5)
WBC Count: 5.4 10*3/uL (ref 4.0–10.5)
WBC Morphology: 51
nRBC: 15.2 % — ABNORMAL HIGH (ref 0.0–0.2)

## 2018-08-24 LAB — CMP (CANCER CENTER ONLY)
ALT: 9 U/L (ref 0–44)
AST: 13 U/L — ABNORMAL LOW (ref 15–41)
Albumin: 3.9 g/dL (ref 3.5–5.0)
Alkaline Phosphatase: 76 U/L (ref 38–126)
Anion gap: 11 (ref 5–15)
BUN: 25 mg/dL — ABNORMAL HIGH (ref 6–20)
CO2: 32 mmol/L (ref 22–32)
Calcium: 9.2 mg/dL (ref 8.9–10.3)
Chloride: 97 mmol/L — ABNORMAL LOW (ref 98–111)
Creatinine: 7.61 mg/dL (ref 0.61–1.24)
GFR, Est AFR Am: 8 mL/min — ABNORMAL LOW (ref >60)
GFR, Estimated: 7 mL/min — ABNORMAL LOW (ref >60)
Glucose, Bld: 144 mg/dL — ABNORMAL HIGH (ref 70–99)
Potassium: 4.9 mmol/L (ref 3.5–5.1)
Sodium: 140 mmol/L (ref 135–145)
Total Bilirubin: 0.9 mg/dL (ref 0.3–1.2)
Total Protein: 8 g/dL (ref 6.5–8.1)

## 2018-08-24 MED ORDER — PROCHLORPERAZINE MALEATE 10 MG PO TABS: 10.0000 mg | ORAL_TABLET | Freq: Once | ORAL | Status: DC

## 2018-08-24 MED ORDER — BORTEZOMIB CHEMO SQ INJECTION 3.5 MG (2.5MG/ML)
1.5000 mg/m2 | Freq: Once | INTRAMUSCULAR | Status: AC
  Administered 2018-08-24: 3.25 mg via SUBCUTANEOUS
  Filled 2018-08-24: qty 1.3

## 2018-08-24 NOTE — Progress Notes (Signed)
Per Dr. Alen Blew it is ok to treat with Velcade today with creatinine 7.61 and ok to treat without differential results.

## 2018-08-24 NOTE — Patient Instructions (Signed)
[facility] Discharge Instructions for Patients Receiving Chemotherapy  Today you received the following chemotherapy agents Velcade.  To help prevent nausea and vomiting after your treatment, we encourage you to take your nausea medication as directed.  If you develop nausea and vomiting that is not controlled by your nausea medication, call the clinic.   BELOW ARE SYMPTOMS THAT SHOULD BE REPORTED IMMEDIATELY:  *FEVER GREATER THAN 100.5 F  *CHILLS WITH OR WITHOUT FEVER  NAUSEA AND VOMITING THAT IS NOT CONTROLLED WITH YOUR NAUSEA MEDICATION  *UNUSUAL SHORTNESS OF BREATH  *UNUSUAL BRUISING OR BLEEDING  TENDERNESS IN MOUTH AND THROAT WITH OR WITHOUT PRESENCE OF ULCERS  *URINARY PROBLEMS  *BOWEL PROBLEMS  UNUSUAL RASH Items with * indicate a potential emergency and should be followed up as soon as possible.  Feel free to call the clinic should you have any questions or concerns. The clinic phone number is [phone].  Please show the [facility] at check-in to the Emergency Department and triage nurse.

## 2018-08-25 DIAGNOSIS — D509 Iron deficiency anemia, unspecified: Secondary | ICD-10-CM | POA: Diagnosis not present

## 2018-08-25 DIAGNOSIS — N2581 Secondary hyperparathyroidism of renal origin: Secondary | ICD-10-CM | POA: Diagnosis not present

## 2018-08-25 DIAGNOSIS — D631 Anemia in chronic kidney disease: Secondary | ICD-10-CM | POA: Diagnosis not present

## 2018-08-25 DIAGNOSIS — N186 End stage renal disease: Secondary | ICD-10-CM | POA: Diagnosis not present

## 2018-08-25 DIAGNOSIS — E1129 Type 2 diabetes mellitus with other diabetic kidney complication: Secondary | ICD-10-CM | POA: Diagnosis not present

## 2018-08-25 MED FILL — SUBVENITE 150 MG TABS: 150 | 90 days supply | Qty: 180 | Fill #1

## 2018-08-26 MED FILL — METOPROLOL TARTRATE 25 MG T: 25 | 90 days supply | Qty: 90 | Fill #0

## 2018-08-27 ENCOUNTER — Other Ambulatory Visit: Payer: Self-pay | Admitting: Oncology

## 2018-08-27 DIAGNOSIS — N2581 Secondary hyperparathyroidism of renal origin: Secondary | ICD-10-CM | POA: Diagnosis not present

## 2018-08-27 DIAGNOSIS — E1129 Type 2 diabetes mellitus with other diabetic kidney complication: Secondary | ICD-10-CM | POA: Diagnosis not present

## 2018-08-27 DIAGNOSIS — D631 Anemia in chronic kidney disease: Secondary | ICD-10-CM | POA: Diagnosis not present

## 2018-08-27 DIAGNOSIS — N186 End stage renal disease: Secondary | ICD-10-CM | POA: Diagnosis not present

## 2018-08-27 DIAGNOSIS — D509 Iron deficiency anemia, unspecified: Secondary | ICD-10-CM | POA: Diagnosis not present

## 2018-08-29 DIAGNOSIS — E1129 Type 2 diabetes mellitus with other diabetic kidney complication: Secondary | ICD-10-CM | POA: Diagnosis not present

## 2018-08-29 DIAGNOSIS — N186 End stage renal disease: Secondary | ICD-10-CM | POA: Diagnosis not present

## 2018-08-29 DIAGNOSIS — D509 Iron deficiency anemia, unspecified: Secondary | ICD-10-CM | POA: Diagnosis not present

## 2018-08-29 DIAGNOSIS — N2581 Secondary hyperparathyroidism of renal origin: Secondary | ICD-10-CM | POA: Diagnosis not present

## 2018-08-29 DIAGNOSIS — D631 Anemia in chronic kidney disease: Secondary | ICD-10-CM | POA: Diagnosis not present

## 2018-08-31 ENCOUNTER — Inpatient Hospital Stay: Payer: Medicare Other

## 2018-08-31 VITALS — BP 147/79 | HR 66 | Temp 98.4°F | Resp 16

## 2018-08-31 DIAGNOSIS — E1129 Type 2 diabetes mellitus with other diabetic kidney complication: Secondary | ICD-10-CM | POA: Diagnosis not present

## 2018-08-31 DIAGNOSIS — C9000 Multiple myeloma not having achieved remission: Secondary | ICD-10-CM | POA: Diagnosis not present

## 2018-08-31 DIAGNOSIS — N186 End stage renal disease: Secondary | ICD-10-CM | POA: Diagnosis not present

## 2018-08-31 DIAGNOSIS — D631 Anemia in chronic kidney disease: Secondary | ICD-10-CM | POA: Diagnosis not present

## 2018-08-31 DIAGNOSIS — D472 Monoclonal gammopathy: Secondary | ICD-10-CM

## 2018-08-31 DIAGNOSIS — Z5112 Encounter for antineoplastic immunotherapy: Secondary | ICD-10-CM | POA: Diagnosis not present

## 2018-08-31 DIAGNOSIS — N2581 Secondary hyperparathyroidism of renal origin: Secondary | ICD-10-CM | POA: Diagnosis not present

## 2018-08-31 DIAGNOSIS — D509 Iron deficiency anemia, unspecified: Secondary | ICD-10-CM | POA: Diagnosis not present

## 2018-08-31 LAB — CMP (CANCER CENTER ONLY)
ALT: 7 U/L (ref 0–44)
AST: 15 U/L (ref 15–41)
Albumin: 3.8 g/dL (ref 3.5–5.0)
Alkaline Phosphatase: 71 U/L (ref 38–126)
Anion gap: 11 (ref 5–15)
BUN: 12 mg/dL (ref 6–20)
CO2: 32 mmol/L (ref 22–32)
Calcium: 8.5 mg/dL — ABNORMAL LOW (ref 8.9–10.3)
Chloride: 95 mmol/L — ABNORMAL LOW (ref 98–111)
Creatinine: 4.77 mg/dL (ref 0.61–1.24)
GFR, Est AFR Am: 15 mL/min — ABNORMAL LOW (ref >60)
GFR, Estimated: 13 mL/min — ABNORMAL LOW (ref >60)
Glucose, Bld: 136 mg/dL — ABNORMAL HIGH (ref 70–99)
Potassium: 3.2 mmol/L — ABNORMAL LOW (ref 3.5–5.1)
Sodium: 138 mmol/L (ref 135–145)
Total Bilirubin: 1.1 mg/dL (ref 0.3–1.2)
Total Protein: 7.8 g/dL (ref 6.5–8.1)

## 2018-08-31 LAB — CBC WITH DIFFERENTIAL (CANCER CENTER ONLY)
Basophils Absolute: 0 10*3/uL (ref 0.0–0.1)
Eosinophils Absolute: 0.9 10*3/uL — ABNORMAL HIGH (ref 0.0–0.5)
Eosinophils Relative: 10 %
HCT: 28.6 % — ABNORMAL LOW (ref 39.0–52.0)
Hemoglobin: 10.5 g/dL — ABNORMAL LOW (ref 13.0–17.0)
Lymphocytes Relative: 8 %
Lymphs Abs: 0.7 10*3/uL (ref 0.7–4.0)
MCH: 32.8 pg (ref 26.0–34.0)
MCHC: 36.7 g/dL — ABNORMAL HIGH (ref 30.0–36.0)
MCV: 89.4 fL (ref 80.0–100.0)
Monocytes Absolute: 0.7 10*3/uL (ref 0.1–1.0)
Monocytes Relative: 8 %
Neutro Abs: 6.3 10*3/uL (ref 1.7–7.7)
Neutrophils Relative %: 74 %
Platelet Count: 241 10*3/uL (ref 150–400)
RBC: 3.2 MIL/uL — ABNORMAL LOW (ref 4.22–5.81)
RDW: 16.1 % — ABNORMAL HIGH (ref 11.5–15.5)
WBC Count: 8.5 10*3/uL (ref 4.0–10.5)
nRBC: 14.9 % — ABNORMAL HIGH (ref 0.0–0.2)

## 2018-08-31 MED ORDER — BORTEZOMIB CHEMO SQ INJECTION 3.5 MG (2.5MG/ML)
1.5000 mg/m2 | Freq: Once | INTRAMUSCULAR | Status: AC
  Administered 2018-08-31: 3.25 mg via SUBCUTANEOUS
  Filled 2018-08-31: qty 1.3

## 2018-08-31 MED ORDER — PROCHLORPERAZINE MALEATE 10 MG PO TABS: 10.0000 mg | ORAL_TABLET | Freq: Once | ORAL | Status: DC

## 2018-08-31 MED FILL — BREO ELLIPTA 200-25 MCG INH: 200-25 | 30 days supply | Qty: 60 | Fill #0

## 2018-08-31 NOTE — Patient Instructions (Signed)
[facility] Discharge Instructions for Patients Receiving Chemotherapy  Today you received the following chemotherapy agents Velcade. To help prevent nausea and vomiting after your treatment, we encourage you to take your nausea medication as directed.  If you develop nausea and vomiting that is not controlled by your nausea medication, call the clinic.   BELOW ARE SYMPTOMS THAT SHOULD BE REPORTED IMMEDIATELY:  *FEVER GREATER THAN 100.5 F  *CHILLS WITH OR WITHOUT FEVER  NAUSEA AND VOMITING THAT IS NOT CONTROLLED WITH YOUR NAUSEA MEDICATION  *UNUSUAL SHORTNESS OF BREATH  *UNUSUAL BRUISING OR BLEEDING  TENDERNESS IN MOUTH AND THROAT WITH OR WITHOUT PRESENCE OF ULCERS  *URINARY PROBLEMS  *BOWEL PROBLEMS  UNUSUAL RASH Items with * indicate a potential emergency and should be followed up as soon as possible.  Feel free to call the clinic you have any questions or concerns. The clinic phone number is [phone].  Please show the [facility] at check-in to the Emergency Department and triage nurse.

## 2018-09-02 ENCOUNTER — Other Ambulatory Visit: Payer: Self-pay | Admitting: Oncology

## 2018-09-02 DIAGNOSIS — C9000 Multiple myeloma not having achieved remission: Secondary | ICD-10-CM

## 2018-09-03 DIAGNOSIS — D509 Iron deficiency anemia, unspecified: Secondary | ICD-10-CM | POA: Diagnosis not present

## 2018-09-03 DIAGNOSIS — D631 Anemia in chronic kidney disease: Secondary | ICD-10-CM | POA: Diagnosis not present

## 2018-09-03 DIAGNOSIS — E1129 Type 2 diabetes mellitus with other diabetic kidney complication: Secondary | ICD-10-CM | POA: Diagnosis not present

## 2018-09-03 DIAGNOSIS — N2581 Secondary hyperparathyroidism of renal origin: Secondary | ICD-10-CM | POA: Diagnosis not present

## 2018-09-03 DIAGNOSIS — N186 End stage renal disease: Secondary | ICD-10-CM | POA: Diagnosis not present

## 2018-09-05 DIAGNOSIS — N2581 Secondary hyperparathyroidism of renal origin: Secondary | ICD-10-CM | POA: Diagnosis not present

## 2018-09-05 DIAGNOSIS — D631 Anemia in chronic kidney disease: Secondary | ICD-10-CM | POA: Diagnosis not present

## 2018-09-05 DIAGNOSIS — N186 End stage renal disease: Secondary | ICD-10-CM | POA: Diagnosis not present

## 2018-09-05 DIAGNOSIS — D509 Iron deficiency anemia, unspecified: Secondary | ICD-10-CM | POA: Diagnosis not present

## 2018-09-05 DIAGNOSIS — E1129 Type 2 diabetes mellitus with other diabetic kidney complication: Secondary | ICD-10-CM | POA: Diagnosis not present

## 2018-09-07 ENCOUNTER — Inpatient Hospital Stay: Payer: Medicare Other

## 2018-09-07 VITALS — BP 123/66 | HR 64 | Temp 98.5°F | Resp 16

## 2018-09-07 DIAGNOSIS — C9000 Multiple myeloma not having achieved remission: Secondary | ICD-10-CM

## 2018-09-07 DIAGNOSIS — D472 Monoclonal gammopathy: Secondary | ICD-10-CM

## 2018-09-07 DIAGNOSIS — N2581 Secondary hyperparathyroidism of renal origin: Secondary | ICD-10-CM | POA: Diagnosis not present

## 2018-09-07 DIAGNOSIS — D509 Iron deficiency anemia, unspecified: Secondary | ICD-10-CM | POA: Diagnosis not present

## 2018-09-07 DIAGNOSIS — Z5112 Encounter for antineoplastic immunotherapy: Secondary | ICD-10-CM | POA: Diagnosis not present

## 2018-09-07 DIAGNOSIS — N186 End stage renal disease: Secondary | ICD-10-CM | POA: Diagnosis not present

## 2018-09-07 DIAGNOSIS — D631 Anemia in chronic kidney disease: Secondary | ICD-10-CM | POA: Diagnosis not present

## 2018-09-07 DIAGNOSIS — Z992 Dependence on renal dialysis: Secondary | ICD-10-CM | POA: Diagnosis not present

## 2018-09-07 DIAGNOSIS — E1122 Type 2 diabetes mellitus with diabetic chronic kidney disease: Secondary | ICD-10-CM | POA: Diagnosis not present

## 2018-09-07 DIAGNOSIS — E1129 Type 2 diabetes mellitus with other diabetic kidney complication: Secondary | ICD-10-CM | POA: Diagnosis not present

## 2018-09-07 LAB — CBC WITH DIFFERENTIAL (CANCER CENTER ONLY)
Abs Immature Granulocytes: 0.02 10*3/uL (ref 0.00–0.07)
Basophils Absolute: 0.2 10*3/uL — ABNORMAL HIGH (ref 0.0–0.1)
Basophils Relative: 2 %
Eosinophils Absolute: 0.5 10*3/uL (ref 0.0–0.5)
Eosinophils Relative: 6 %
HCT: 30.9 % — ABNORMAL LOW (ref 39.0–52.0)
Hemoglobin: 11.3 g/dL — ABNORMAL LOW (ref 13.0–17.0)
Immature Granulocytes: 0 %
Lymphocytes Relative: 6 %
Lymphs Abs: 0.5 10*3/uL — ABNORMAL LOW (ref 0.7–4.0)
MCH: 32.3 pg (ref 26.0–34.0)
MCHC: 36.6 g/dL — ABNORMAL HIGH (ref 30.0–36.0)
MCV: 88.3 fL (ref 80.0–100.0)
Monocytes Absolute: 1.4 10*3/uL — ABNORMAL HIGH (ref 0.1–1.0)
Monocytes Relative: 16 %
Neutro Abs: 5.7 10*3/uL (ref 1.7–7.7)
Neutrophils Relative %: 70 %
Platelet Count: 293 10*3/uL (ref 150–400)
RBC: 3.5 MIL/uL — ABNORMAL LOW (ref 4.22–5.81)
RDW: 17.2 % — ABNORMAL HIGH (ref 11.5–15.5)
WBC Count: 8.3 10*3/uL (ref 4.0–10.5)
nRBC: 19.1 % — ABNORMAL HIGH (ref 0.0–0.2)

## 2018-09-07 LAB — CMP (CANCER CENTER ONLY)
ALT: 8 U/L (ref 0–44)
AST: 16 U/L (ref 15–41)
Albumin: 3.9 g/dL (ref 3.5–5.0)
Alkaline Phosphatase: 76 U/L (ref 38–126)
Anion gap: 12 (ref 5–15)
BUN: 13 mg/dL (ref 6–20)
CO2: 32 mmol/L (ref 22–32)
Calcium: 9.1 mg/dL (ref 8.9–10.3)
Chloride: 97 mmol/L — ABNORMAL LOW (ref 98–111)
Creatinine: 4.81 mg/dL (ref 0.61–1.24)
GFR, Est AFR Am: 15 mL/min — ABNORMAL LOW (ref >60)
GFR, Estimated: 13 mL/min — ABNORMAL LOW (ref >60)
Glucose, Bld: 118 mg/dL — ABNORMAL HIGH (ref 70–99)
Potassium: 3.2 mmol/L — ABNORMAL LOW (ref 3.5–5.1)
Sodium: 141 mmol/L (ref 135–145)
Total Bilirubin: 1.2 mg/dL (ref 0.3–1.2)
Total Protein: 8 g/dL (ref 6.5–8.1)

## 2018-09-07 MED ORDER — PROCHLORPERAZINE MALEATE 10 MG PO TABS: 10.0000 mg | ORAL_TABLET | Freq: Once | ORAL | Status: DC

## 2018-09-07 MED ORDER — BORTEZOMIB CHEMO SQ INJECTION 3.5 MG (2.5MG/ML)
1.5000 mg/m2 | Freq: Once | INTRAMUSCULAR | Status: AC
  Administered 2018-09-07: 3.25 mg via SUBCUTANEOUS
  Filled 2018-09-07: qty 1.3

## 2018-09-07 MED ORDER — PROCHLORPERAZINE MALEATE 10 MG PO TABS
ORAL_TABLET | ORAL | Status: AC
  Filled 2018-09-07: qty 1

## 2018-09-07 NOTE — Patient Instructions (Signed)
[facility] Discharge Instructions for Patients Receiving Chemotherapy  Today you received the following chemotherapy agents Velcade. To help prevent nausea and vomiting after your treatment, we encourage you to take your nausea medication as directed.  If you develop nausea and vomiting that is not controlled by your nausea medication, call the clinic.   BELOW ARE SYMPTOMS THAT SHOULD BE REPORTED IMMEDIATELY:  *FEVER GREATER THAN 100.5 F  *CHILLS WITH OR WITHOUT FEVER  NAUSEA AND VOMITING THAT IS NOT CONTROLLED WITH YOUR NAUSEA MEDICATION  *UNUSUAL SHORTNESS OF BREATH  *UNUSUAL BRUISING OR BLEEDING  TENDERNESS IN MOUTH AND THROAT WITH OR WITHOUT PRESENCE OF ULCERS  *URINARY PROBLEMS  *BOWEL PROBLEMS  UNUSUAL RASH Items with * indicate a potential emergency and should be followed up as soon as possible.  Feel free to call the clinic you have any questions or concerns. The clinic phone number is [phone].  Please show the [place] at check-in to the Emergency Department and triage nurse.

## 2018-09-08 DIAGNOSIS — N186 End stage renal disease: Secondary | ICD-10-CM | POA: Diagnosis not present

## 2018-09-08 DIAGNOSIS — E1122 Type 2 diabetes mellitus with diabetic chronic kidney disease: Secondary | ICD-10-CM | POA: Diagnosis not present

## 2018-09-08 DIAGNOSIS — Z992 Dependence on renal dialysis: Secondary | ICD-10-CM | POA: Diagnosis not present

## 2018-09-09 LAB — KAPPA/LAMBDA LIGHT CHAINS
Kappa free light chain: 219.4 mg/L — ABNORMAL HIGH (ref 3.3–19.4)
Kappa, lambda light chain ratio: 3.44 — ABNORMAL HIGH (ref 0.26–1.65)
Lambda free light chains: 63.7 mg/L — ABNORMAL HIGH (ref 5.7–26.3)

## 2018-09-09 LAB — MULTIPLE MYELOMA PANEL, SERUM
Albumin SerPl Elph-Mcnc: 3.8 g/dL (ref 2.9–4.4)
Albumin/Glob SerPl: 1.2 (ref 0.7–1.7)
Alpha 1: 0.3 g/dL (ref 0.0–0.4)
Alpha2 Glob SerPl Elph-Mcnc: 0.6 g/dL (ref 0.4–1.0)
B-Globulin SerPl Elph-Mcnc: 0.8 g/dL (ref 0.7–1.3)
Gamma Glob SerPl Elph-Mcnc: 1.7 g/dL (ref 0.4–1.8)
Globulin, Total: 3.4 g/dL (ref 2.2–3.9)
IgA: 35 mg/dL — ABNORMAL LOW (ref 90–386)
IgG (Immunoglobin G), Serum: 1935 mg/dL — ABNORMAL HIGH (ref 700–1600)
IgM (Immunoglobulin M), Srm: 16 mg/dL — ABNORMAL LOW (ref 20–172)
M Protein SerPl Elph-Mcnc: 1.3 g/dL — ABNORMAL HIGH
Total Protein ELP: 7.2 g/dL (ref 6.0–8.5)

## 2018-09-09 MED FILL — DEXAMETHASONE 4 MG TABLET: 4 | 84 days supply | Qty: 60 | Fill #0

## 2018-09-09 MED FILL — CYCLOPHOSPHAMIDE 50 MG CAP: 50 | 28 days supply | Qty: 52 | Fill #0

## 2018-09-10 DIAGNOSIS — N186 End stage renal disease: Secondary | ICD-10-CM | POA: Diagnosis not present

## 2018-09-10 DIAGNOSIS — E1129 Type 2 diabetes mellitus with other diabetic kidney complication: Secondary | ICD-10-CM | POA: Diagnosis not present

## 2018-09-10 DIAGNOSIS — N2581 Secondary hyperparathyroidism of renal origin: Secondary | ICD-10-CM | POA: Diagnosis not present

## 2018-09-10 DIAGNOSIS — D509 Iron deficiency anemia, unspecified: Secondary | ICD-10-CM | POA: Diagnosis not present

## 2018-09-10 DIAGNOSIS — D631 Anemia in chronic kidney disease: Secondary | ICD-10-CM | POA: Diagnosis not present

## 2018-09-10 MED FILL — VIMPAT 150 MG TABLET: 150 | 30 days supply | Qty: 75 | Fill #1

## 2018-09-13 DIAGNOSIS — D631 Anemia in chronic kidney disease: Secondary | ICD-10-CM | POA: Diagnosis not present

## 2018-09-13 DIAGNOSIS — D509 Iron deficiency anemia, unspecified: Secondary | ICD-10-CM | POA: Diagnosis not present

## 2018-09-13 DIAGNOSIS — E1129 Type 2 diabetes mellitus with other diabetic kidney complication: Secondary | ICD-10-CM | POA: Diagnosis not present

## 2018-09-13 DIAGNOSIS — N186 End stage renal disease: Secondary | ICD-10-CM | POA: Diagnosis not present

## 2018-09-13 DIAGNOSIS — N2581 Secondary hyperparathyroidism of renal origin: Secondary | ICD-10-CM | POA: Diagnosis not present

## 2018-09-14 ENCOUNTER — Inpatient Hospital Stay (HOSPITAL_BASED_OUTPATIENT_CLINIC_OR_DEPARTMENT_OTHER): Payer: Medicare Other | Admitting: Oncology

## 2018-09-14 ENCOUNTER — Inpatient Hospital Stay: Payer: Medicare Other | Attending: Oncology

## 2018-09-14 ENCOUNTER — Inpatient Hospital Stay: Payer: Medicare Other

## 2018-09-14 VITALS — BP 128/71 | HR 71 | Temp 98.0°F | Resp 17 | Ht 64.0 in | Wt 213.7 lb

## 2018-09-14 DIAGNOSIS — C9000 Multiple myeloma not having achieved remission: Secondary | ICD-10-CM | POA: Insufficient documentation

## 2018-09-14 DIAGNOSIS — Z5112 Encounter for antineoplastic immunotherapy: Secondary | ICD-10-CM | POA: Insufficient documentation

## 2018-09-14 DIAGNOSIS — Z992 Dependence on renal dialysis: Secondary | ICD-10-CM

## 2018-09-14 DIAGNOSIS — E1122 Type 2 diabetes mellitus with diabetic chronic kidney disease: Secondary | ICD-10-CM

## 2018-09-14 DIAGNOSIS — N189 Chronic kidney disease, unspecified: Secondary | ICD-10-CM | POA: Diagnosis not present

## 2018-09-14 DIAGNOSIS — I129 Hypertensive chronic kidney disease with stage 1 through stage 4 chronic kidney disease, or unspecified chronic kidney disease: Secondary | ICD-10-CM

## 2018-09-14 DIAGNOSIS — D472 Monoclonal gammopathy: Secondary | ICD-10-CM

## 2018-09-14 LAB — CMP (CANCER CENTER ONLY)
ALT: 9 U/L (ref 0–44)
AST: 20 U/L (ref 15–41)
Albumin: 4 g/dL (ref 3.5–5.0)
Alkaline Phosphatase: 78 U/L (ref 38–126)
Anion gap: 12 (ref 5–15)
BUN: 27 mg/dL — ABNORMAL HIGH (ref 6–20)
CO2: 29 mmol/L (ref 22–32)
Calcium: 9.5 mg/dL (ref 8.9–10.3)
Chloride: 97 mmol/L — ABNORMAL LOW (ref 98–111)
Creatinine: 8.49 mg/dL (ref 0.61–1.24)
GFR, Est AFR Am: 7 mL/min — ABNORMAL LOW (ref >60)
GFR, Estimated: 6 mL/min — ABNORMAL LOW (ref >60)
Glucose, Bld: 164 mg/dL — ABNORMAL HIGH (ref 70–99)
Potassium: 4.8 mmol/L (ref 3.5–5.1)
Sodium: 138 mmol/L (ref 135–145)
Total Bilirubin: 1 mg/dL (ref 0.3–1.2)
Total Protein: 7.7 g/dL (ref 6.5–8.1)

## 2018-09-14 LAB — CBC WITH DIFFERENTIAL (CANCER CENTER ONLY)
Abs Immature Granulocytes: 0.04 10*3/uL (ref 0.00–0.07)
Basophils Absolute: 0.1 10*3/uL (ref 0.0–0.1)
Basophils Relative: 1 %
Eosinophils Absolute: 0 10*3/uL (ref 0.0–0.5)
Eosinophils Relative: 0 %
HCT: 29.9 % — ABNORMAL LOW (ref 39.0–52.0)
Hemoglobin: 11.2 g/dL — ABNORMAL LOW (ref 13.0–17.0)
Immature Granulocytes: 1 %
Lymphocytes Relative: 1 %
Lymphs Abs: 0 10*3/uL — ABNORMAL LOW (ref 0.7–4.0)
MCH: 32.6 pg (ref 26.0–34.0)
MCHC: 37.5 g/dL — ABNORMAL HIGH (ref 30.0–36.0)
MCV: 86.9 fL (ref 80.0–100.0)
Monocytes Absolute: 0.1 10*3/uL (ref 0.1–1.0)
Monocytes Relative: 2 %
Neutro Abs: 7.2 10*3/uL (ref 1.7–7.7)
Neutrophils Relative %: 95 %
Platelet Count: 306 10*3/uL (ref 150–400)
RBC: 3.44 MIL/uL — ABNORMAL LOW (ref 4.22–5.81)
RDW: 17.2 % — ABNORMAL HIGH (ref 11.5–15.5)
WBC Count: 7.5 10*3/uL (ref 4.0–10.5)
nRBC: 16.6 % — ABNORMAL HIGH (ref 0.0–0.2)

## 2018-09-14 MED ORDER — BORTEZOMIB CHEMO SQ INJECTION 3.5 MG (2.5MG/ML)
1.5000 mg/m2 | Freq: Once | INTRAMUSCULAR | Status: AC
  Administered 2018-09-14: 3.25 mg via SUBCUTANEOUS
  Filled 2018-09-14: qty 1.3

## 2018-09-14 MED ORDER — PROCHLORPERAZINE MALEATE 10 MG PO TABS: 10.0000 mg | ORAL_TABLET | Freq: Once | ORAL | Status: DC

## 2018-09-14 NOTE — Progress Notes (Signed)
OK to treat with labs today per MD [facility]

## 2018-09-14 NOTE — Progress Notes (Signed)
Hematology and Oncology Follow Up Visit  [place] [id number] August 24, 1966 53 y.o. 09/14/2018 11:40 AM    Principle Diagnosis: 53 year old man with  1.  Multiple myeloma that became symptomatic in September 2019.  He presented with IgG kappa plasma cell disorder in 2012.  He was found to have 20% plasma infiltration in the bone marrow.  2.  Chronic renal failure: Related to longstanding hypertension and diabetes less likely plasma cell disorder.  Current therapy: Velcade dexamethasone and cyclophosphamide weekly started on 05/25/2018.  Velcade is given at 1.5 mg/m weekly, dexamethasone is 20 mg weekly and and Cytoxan is given at 650 mg total dose.  He is here for the next cycle of therapy.  Interim History:  Mr. Kumpf returns today for a repeat evaluation.  Since the last visit, he reports no major changes in his health.  He continues to tolerate the current therapy without any major issues or complaints.  He does report mild loose stools on the day of the Velcade injection but no other GI toxicities.  He denies any worsening diarrhea or abdominal distention.  He does report mild arthralgias are manageable at this time.  Continues to ambulate without any difficulties.  He denies any falls or syncope.  He does not report any headaches, blurry vision, syncope.  He does not report any alteration of mental status or lethargy.  He does not report any fevers, chills, sweats or constitutional symptoms. He does not report any  palpitation, orthopnea or leg edema. Does not report any cough or hemoptysis or hematemesis. He does not report any nausea, vomiting.  Denies any hematochezia or melena.  Denies any constipation.  He does not report any skin rashes or lesions.  He denies any bleeding or clotting tendency.  Denies any lymphadenopathy or skin rashes.  Denies any mood changes.  Rest of his review of systems is negative.   Medications: I have reviewed the patient's current medications.  Current  Outpatient Medications:  .  acyclovir (ZOVIRAX) 400 MG tablet, TAKE 1 TABLET (400 MG TOTAL) BY MOUTH DAILY., Disp: 90 tablet, Rfl: 0 .  albuterol (PROAIR HFA) 108 (90 Base) MCG/ACT inhaler, Inhale two puffs every 4-6 hours if needed for cough or wheeze, Disp: 1 Inhaler, Rfl: 1 .  aspirin EC 81 MG tablet, Take 1 tablet (81 mg total) by mouth daily., Disp: 100 tablet, Rfl: 0 .  calcium acetate (PHOSLO) 667 MG capsule, Take 2,001 mg by mouth 3 (three) times daily with meals. , Disp: , Rfl:  .  Cetirizine HCl 10 MG CAPS, Take 1 capsule by mouth daily., Disp: , Rfl:  .  cinacalcet (SENSIPAR) 60 MG tablet, Take 60 mg by mouth every other day. , Disp: , Rfl:  .  clonazePAM (KLONOPIN) 0.5 MG tablet, Take 1 tablet prior to each dialysis session, Disp: 15 tablet, Rfl: 5 .  cyclophosphamide (CYTOXAN) 50 MG capsule, TAKE 13 CAPSULES (650MG) BY MOUTH ONCE WEEKLY, TAKE EARLY IN THE DAY, Disp: 52 capsule, Rfl: 3 .  dexamethasone (DECADRON) 4 MG tablet, Take 5 tablets weekly, Disp: 90 tablet, Rfl: 3 .  fluticasone furoate-vilanterol (BREO ELLIPTA) 100-25 MCG/INH AEPB, Inhale one puff once daily to prevent cough or wheeze, Disp: 60 each, Rfl: 5 .  insulin lispro (HUMALOG) 100 UNIT/ML injection, Inject into the skin 3 (three) times daily before meals. 3 to 4 units TID prn SS, Disp: , Rfl:  .  Lacosamide (VIMPAT) 150 MG TABS, Take 1 tablet twice a day. Take an additional 1  tablet after dialysis session three times a week., Disp: 75 tablet, Rfl: 5 .  lamoTRIgine (LAMICTAL) 150 MG tablet, Take 1 tablet (150 mg total) by mouth 2 (two) times daily., Disp: 180 tablet, Rfl: 3 .  metoprolol tartrate (LOPRESSOR) 25 MG tablet, Take 0.5 tablets (12.5 mg total) by mouth 2 (two) times daily., Disp: 60 tablet, Rfl: 0 .  midodrine (PROAMATINE) 10 MG tablet, Take 10 mg by mouth 2 (two) times daily with a meal. , Disp: , Rfl:  .  multivitamin (RENA-VIT) TABS tablet, Take 1 tablet by mouth at bedtime., Disp: 30 tablet, Rfl: 1 .   pantoprazole (PROTONIX) 40 MG tablet, Take 1 tablet (40 mg total) by mouth 2 (two) times daily. (Patient taking differently: Take 40 mg by mouth every morning. ), Disp: 60 tablet, Rfl: 1 .  prochlorperazine (COMPAZINE) 10 MG tablet, Take 1 tablet (10 mg total) by mouth every 6 (six) hours as needed for nausea or vomiting., Disp: 30 tablet, Rfl: 0  Allergies:  Allergies  Allergen Reactions  . Codeine Rash    Unknown reaction (patient says it was more serious than just a rash, but he can't remember what happened)    Past Medical History, Surgical history, Social history, and Family History were reviewed and updated.  Physical Exam:  Blood pressure 128/71, pulse 71, temperature 98 F (36.7 C), temperature source Oral, resp. rate 17, height 5' 4"  (1.626 m), weight 213 lb 11.2 oz (96.9 kg), SpO2 96 %.      ECOG: 1    General appearance: Comfortable appearing without any discomfort Head: Normocephalic without any trauma Oropharynx: Mucous membranes are moist and pink without any thrush or ulcers. Eyes: Pupils are equal and round reactive to light. Lymph nodes: No cervical, supraclavicular, inguinal or axillary lymphadenopathy.   Heart:regular rate and rhythm.  S1 and S2 without leg edema. Lung: Clear without any rhonchi or wheezes.  No dullness to percussion. Abdomin: Soft, nontender, nondistended with good bowel sounds.  No hepatosplenomegaly. Musculoskeletal: No joint deformity or effusion.  Full range of motion noted. Neurological: No deficits noted on motor, sensory and deep tendon reflex exam. Skin: No petechial rash or dryness.  Appeared moist.  Psychiatric: Mood and affect appeared appropriate.       Lab Results: Lab Results  Component Value Date   WBC 8.3 09/07/2018   HGB 11.3 (L) 09/07/2018   HCT 30.9 (L) 09/07/2018   MCV 88.3 09/07/2018   PLT 293 09/07/2018     Chemistry      Component Value Date/Time   NA 141 09/07/2018 1212   NA 138 07/03/2017 0813   K  3.2 (L) 09/07/2018 1212   K 4.2 07/03/2017 0813   CL 97 (L) 09/07/2018 1212   CO2 32 09/07/2018 1212   CO2 27 07/03/2017 0813   BUN 13 09/07/2018 1212   BUN 46.4 (H) 07/03/2017 0813   CREATININE 4.81 (HH) 09/07/2018 1212   CREATININE 10.9 (HH) 07/03/2017 0813      Component Value Date/Time   CALCIUM 9.1 09/07/2018 1212   CALCIUM 9.3 07/03/2017 0813   ALKPHOS 76 09/07/2018 1212   ALKPHOS 77 07/03/2017 0813   AST 16 09/07/2018 1212   AST 16 07/03/2017 0813   ALT 8 09/07/2018 1212   ALT 14 07/03/2017 0813   BILITOT 1.2 09/07/2018 1212   BILITOT 0.93 07/03/2017 0813       Results for ZAMERE, PASTERNAK (MRN [id number]) as of 09/14/2018 11:43  Ref. Range 08/03/2018 12:37 09/07/2018 12:12  M Protein SerPl Elph-Mcnc Latest Ref Range: Not Observed g/dL 1.9 (H) 1.3 (H)  IFE 1 Unknown Comment Comment  Globulin, Total Latest Ref Range: 2.2 - 3.9 g/dL 3.7 3.4  B-Globulin SerPl Elph-Mcnc Latest Ref Range: 0.7 - 1.3 g/dL 0.7 0.8  IgG (Immunoglobin G), Serum Latest Ref Range: 700 - 1,600 mg/dL 2,590 (H) 1,935 (H)  IgM (Immunoglobulin M), Srm Latest Ref Range: 20 - 172 mg/dL 17 (L) 16 (L)  IgA Latest Ref Range: 90 - 386 mg/dL 36 (L) 35 (L)       Impression and Plan:  53 year old man with  1.  Multiple myeloma presenting with symptomatic disease in 2019.  He presented with IgG kappa plasma cell disorder in 2012.    He is receiving Velcade, dexamethasone and cyclophosphamide induction since September 2019 without any major complications.  Protein studies obtained on September 07, 2018 she had a reasonable response currently M spike down to 1.3 g/dL from 4.0.  Risks and benefits of continuing this approach was discussed today and is agreeable to continue.  The goal is to get him to close to remission is possible before consideration for renal transplant.     2. Anemia: Hemoglobin appears adequate without any need for transfusion.  3. Renal failure: Currently on dialysis without any recent  changes.  4.  Zoster prophylaxis: No recent exacerbation reactivation.  Currently on acyclovir.  5.  Diabetes: Blood sugar appears adequate at this time without any issues.  6.  Nausea prophylaxis: No issues reported and currently on Compazine.  7.  Goals of care: Aggressive therapy is warranted given his disease is treatable.  8. Follow-up: Weekly for treatment with Velcade and dexamethasone and cyclophosphamide.  Have a repeat MD follow-up in 4 weeks.  25  minutes was spent with the patient face-to-face today.  More than 50% of time was dedicated to discussing the natural course of his disease, reviewing laboratory data and coordinating plan of care.     Zola Button, MD 1/7/202011:40 AM

## 2018-09-14 NOTE — Patient Instructions (Signed)
[facility] Discharge Instructions for Patients Receiving Chemotherapy  Today you received the following chemotherapy agents Bortezomib (VELCADE).  To help prevent nausea and vomiting after your treatment, we encourage you to take your nausea medication as prescribed.   If you develop nausea and vomiting that is not controlled by your nausea medication, call the clinic.   BELOW ARE SYMPTOMS THAT SHOULD BE REPORTED IMMEDIATELY:  *FEVER GREATER THAN 100.5 F  *CHILLS WITH OR WITHOUT FEVER  NAUSEA AND VOMITING THAT IS NOT CONTROLLED WITH YOUR NAUSEA MEDICATION  *UNUSUAL SHORTNESS OF BREATH  *UNUSUAL BRUISING OR BLEEDING  TENDERNESS IN MOUTH AND THROAT WITH OR WITHOUT PRESENCE OF ULCERS  *URINARY PROBLEMS  *BOWEL PROBLEMS  UNUSUAL RASH Items with * indicate a potential emergency and should be followed up as soon as possible.  Feel free to call the clinic should you have any questions or concerns. The clinic phone number is [phone].  Please show the [facility] at check-in to the Emergency Department and triage nurse.

## 2018-09-15 DIAGNOSIS — N2581 Secondary hyperparathyroidism of renal origin: Secondary | ICD-10-CM | POA: Diagnosis not present

## 2018-09-15 DIAGNOSIS — D509 Iron deficiency anemia, unspecified: Secondary | ICD-10-CM | POA: Diagnosis not present

## 2018-09-15 DIAGNOSIS — N186 End stage renal disease: Secondary | ICD-10-CM | POA: Diagnosis not present

## 2018-09-15 DIAGNOSIS — D631 Anemia in chronic kidney disease: Secondary | ICD-10-CM | POA: Diagnosis not present

## 2018-09-15 DIAGNOSIS — E1129 Type 2 diabetes mellitus with other diabetic kidney complication: Secondary | ICD-10-CM | POA: Diagnosis not present

## 2018-09-16 DIAGNOSIS — M2042 Other hammer toe(s) (acquired), left foot: Secondary | ICD-10-CM | POA: Diagnosis not present

## 2018-09-16 DIAGNOSIS — M2041 Other hammer toe(s) (acquired), right foot: Secondary | ICD-10-CM | POA: Diagnosis not present

## 2018-09-16 DIAGNOSIS — B351 Tinea unguium: Secondary | ICD-10-CM | POA: Diagnosis not present

## 2018-09-17 ENCOUNTER — Telehealth: Payer: Self-pay

## 2018-09-17 DIAGNOSIS — N186 End stage renal disease: Secondary | ICD-10-CM | POA: Diagnosis not present

## 2018-09-17 DIAGNOSIS — N2581 Secondary hyperparathyroidism of renal origin: Secondary | ICD-10-CM | POA: Diagnosis not present

## 2018-09-17 DIAGNOSIS — D631 Anemia in chronic kidney disease: Secondary | ICD-10-CM | POA: Diagnosis not present

## 2018-09-17 DIAGNOSIS — E1129 Type 2 diabetes mellitus with other diabetic kidney complication: Secondary | ICD-10-CM | POA: Diagnosis not present

## 2018-09-17 DIAGNOSIS — D509 Iron deficiency anemia, unspecified: Secondary | ICD-10-CM | POA: Diagnosis not present

## 2018-09-17 NOTE — Telephone Encounter (Signed)
-----   Message from Wyatt Portela, MD sent at 09/17/2018 10:40 AM EST ----- Regarding: RE: dental appt No objections.  ----- Message ----- From: Scot Dock, RN Sent: 09/17/2018  10:27 AM EST To: Wyatt Portela, MD Subject: dental appt                                    Patient wants to make sure he can go to routine dental appt while on Velcade. Please advise. Thanks

## 2018-09-17 NOTE — Telephone Encounter (Signed)
Contacted patient spouse [name] and made her aware that the patient can follow up with the routine dental exam per Dr. Alen Blew. She verbalized understanding and had no questions or concerns.

## 2018-09-20 DIAGNOSIS — N2581 Secondary hyperparathyroidism of renal origin: Secondary | ICD-10-CM | POA: Diagnosis not present

## 2018-09-20 DIAGNOSIS — D631 Anemia in chronic kidney disease: Secondary | ICD-10-CM | POA: Diagnosis not present

## 2018-09-20 DIAGNOSIS — N186 End stage renal disease: Secondary | ICD-10-CM | POA: Diagnosis not present

## 2018-09-20 DIAGNOSIS — D509 Iron deficiency anemia, unspecified: Secondary | ICD-10-CM | POA: Diagnosis not present

## 2018-09-20 DIAGNOSIS — E1129 Type 2 diabetes mellitus with other diabetic kidney complication: Secondary | ICD-10-CM | POA: Diagnosis not present

## 2018-09-20 MED FILL — clonazePAM 0.5 MG TABS: 0.5 | 15 days supply | Qty: 15 | Fill #1

## 2018-09-21 ENCOUNTER — Inpatient Hospital Stay: Payer: Medicare Other

## 2018-09-21 VITALS — BP 141/76 | HR 70 | Temp 98.5°F | Resp 18

## 2018-09-21 DIAGNOSIS — Z5112 Encounter for antineoplastic immunotherapy: Secondary | ICD-10-CM | POA: Diagnosis not present

## 2018-09-21 DIAGNOSIS — D472 Monoclonal gammopathy: Secondary | ICD-10-CM

## 2018-09-21 DIAGNOSIS — C9000 Multiple myeloma not having achieved remission: Secondary | ICD-10-CM | POA: Diagnosis not present

## 2018-09-21 LAB — CMP (CANCER CENTER ONLY)
ALT: 8 U/L (ref 0–44)
AST: 13 U/L — ABNORMAL LOW (ref 15–41)
Albumin: 3.9 g/dL (ref 3.5–5.0)
Alkaline Phosphatase: 78 U/L (ref 38–126)
Anion gap: 12 (ref 5–15)
BUN: 25 mg/dL — ABNORMAL HIGH (ref 6–20)
CO2: 30 mmol/L (ref 22–32)
Calcium: 9.3 mg/dL (ref 8.9–10.3)
Chloride: 96 mmol/L — ABNORMAL LOW (ref 98–111)
Creatinine: 8.12 mg/dL (ref 0.61–1.24)
GFR, Est AFR Am: 8 mL/min — ABNORMAL LOW (ref >60)
GFR, Estimated: 7 mL/min — ABNORMAL LOW (ref >60)
Glucose, Bld: 218 mg/dL — ABNORMAL HIGH (ref 70–99)
Potassium: 4.3 mmol/L (ref 3.5–5.1)
Sodium: 138 mmol/L (ref 135–145)
Total Bilirubin: 0.9 mg/dL (ref 0.3–1.2)
Total Protein: 7.7 g/dL (ref 6.5–8.1)

## 2018-09-21 LAB — CBC WITH DIFFERENTIAL (CANCER CENTER ONLY)
Abs Immature Granulocytes: 0.01 10*3/uL (ref 0.00–0.07)
Basophils Absolute: 0 10*3/uL (ref 0.0–0.1)
Basophils Relative: 1 %
Eosinophils Absolute: 0 10*3/uL (ref 0.0–0.5)
Eosinophils Relative: 0 %
HCT: 30.9 % — ABNORMAL LOW (ref 39.0–52.0)
Hemoglobin: 11.3 g/dL — ABNORMAL LOW (ref 13.0–17.0)
Immature Granulocytes: 0 %
Lymphocytes Relative: 0 %
Lymphs Abs: 0 10*3/uL — ABNORMAL LOW (ref 0.7–4.0)
MCH: 31.6 pg (ref 26.0–34.0)
MCHC: 36.6 g/dL — ABNORMAL HIGH (ref 30.0–36.0)
MCV: 86.3 fL (ref 80.0–100.0)
Monocytes Absolute: 0.1 10*3/uL (ref 0.1–1.0)
Monocytes Relative: 2 %
Neutro Abs: 4.9 10*3/uL (ref 1.7–7.7)
Neutrophils Relative %: 97 %
Platelet Count: 234 10*3/uL (ref 150–400)
RBC: 3.58 MIL/uL — ABNORMAL LOW (ref 4.22–5.81)
RDW: 17.4 % — ABNORMAL HIGH (ref 11.5–15.5)
WBC Count: 5 10*3/uL (ref 4.0–10.5)
nRBC: 18.9 % — ABNORMAL HIGH (ref 0.0–0.2)

## 2018-09-21 MED ORDER — BORTEZOMIB CHEMO SQ INJECTION 3.5 MG (2.5MG/ML)
1.5000 mg/m2 | Freq: Once | INTRAMUSCULAR | Status: AC
  Administered 2018-09-21: 3.25 mg via SUBCUTANEOUS
  Filled 2018-09-21: qty 1.3

## 2018-09-21 MED ORDER — PROCHLORPERAZINE MALEATE 10 MG PO TABS: 10.0000 mg | ORAL_TABLET | Freq: Once | ORAL | Status: DC

## 2018-09-21 MED ORDER — PROCHLORPERAZINE MALEATE 10 MG PO TABS
ORAL_TABLET | ORAL | Status: AC
  Filled 2018-09-21: qty 1

## 2018-09-21 NOTE — Patient Instructions (Signed)
[facility] Discharge Instructions for Patients Receiving Chemotherapy  Today you received the following chemotherapy agents: Bortezomib (VELCADE).  To help prevent nausea and vomiting after your treatment, we encourage you to take your nausea medication as prescribed.   If you develop nausea and vomiting that is not controlled by your nausea medication, call the clinic.   BELOW ARE SYMPTOMS THAT SHOULD BE REPORTED IMMEDIATELY:  *FEVER GREATER THAN 100.5 F  *CHILLS WITH OR WITHOUT FEVER  NAUSEA AND VOMITING THAT IS NOT CONTROLLED WITH YOUR NAUSEA MEDICATION  *UNUSUAL SHORTNESS OF BREATH  *UNUSUAL BRUISING OR BLEEDING  TENDERNESS IN MOUTH AND THROAT WITH OR WITHOUT PRESENCE OF ULCERS  *URINARY PROBLEMS  *BOWEL PROBLEMS  UNUSUAL RASH Items with * indicate a potential emergency and should be followed up as soon as possible.  Feel free to call the clinic should you have any questions or concerns. The clinic phone number is [phone].  Please show the [facility] at check-in to the Emergency Department and triage nurse.

## 2018-09-21 NOTE — Progress Notes (Signed)
OK to tx without CMET results today per Dr. Alen Blew.

## 2018-09-22 DIAGNOSIS — N186 End stage renal disease: Secondary | ICD-10-CM | POA: Diagnosis not present

## 2018-09-22 DIAGNOSIS — D631 Anemia in chronic kidney disease: Secondary | ICD-10-CM | POA: Diagnosis not present

## 2018-09-22 DIAGNOSIS — D509 Iron deficiency anemia, unspecified: Secondary | ICD-10-CM | POA: Diagnosis not present

## 2018-09-22 DIAGNOSIS — N2581 Secondary hyperparathyroidism of renal origin: Secondary | ICD-10-CM | POA: Diagnosis not present

## 2018-09-22 DIAGNOSIS — E1129 Type 2 diabetes mellitus with other diabetic kidney complication: Secondary | ICD-10-CM | POA: Diagnosis not present

## 2018-09-24 DIAGNOSIS — E1129 Type 2 diabetes mellitus with other diabetic kidney complication: Secondary | ICD-10-CM | POA: Diagnosis not present

## 2018-09-24 DIAGNOSIS — N2581 Secondary hyperparathyroidism of renal origin: Secondary | ICD-10-CM | POA: Diagnosis not present

## 2018-09-24 DIAGNOSIS — D509 Iron deficiency anemia, unspecified: Secondary | ICD-10-CM | POA: Diagnosis not present

## 2018-09-24 DIAGNOSIS — N186 End stage renal disease: Secondary | ICD-10-CM | POA: Diagnosis not present

## 2018-09-24 DIAGNOSIS — D631 Anemia in chronic kidney disease: Secondary | ICD-10-CM | POA: Diagnosis not present

## 2018-09-27 DIAGNOSIS — N186 End stage renal disease: Secondary | ICD-10-CM | POA: Diagnosis not present

## 2018-09-27 DIAGNOSIS — N2581 Secondary hyperparathyroidism of renal origin: Secondary | ICD-10-CM | POA: Diagnosis not present

## 2018-09-27 DIAGNOSIS — E1129 Type 2 diabetes mellitus with other diabetic kidney complication: Secondary | ICD-10-CM | POA: Diagnosis not present

## 2018-09-27 DIAGNOSIS — D631 Anemia in chronic kidney disease: Secondary | ICD-10-CM | POA: Diagnosis not present

## 2018-09-27 DIAGNOSIS — D509 Iron deficiency anemia, unspecified: Secondary | ICD-10-CM | POA: Diagnosis not present

## 2018-09-28 ENCOUNTER — Inpatient Hospital Stay: Payer: Medicare Other

## 2018-09-28 VITALS — BP 151/77 | HR 73 | Temp 97.9°F | Resp 18 | Ht 64.0 in | Wt 211.8 lb

## 2018-09-28 DIAGNOSIS — D472 Monoclonal gammopathy: Secondary | ICD-10-CM

## 2018-09-28 DIAGNOSIS — C9000 Multiple myeloma not having achieved remission: Secondary | ICD-10-CM

## 2018-09-28 DIAGNOSIS — Z5112 Encounter for antineoplastic immunotherapy: Secondary | ICD-10-CM | POA: Diagnosis not present

## 2018-09-28 LAB — CMP (CANCER CENTER ONLY)
ALT: 8 U/L (ref 0–44)
AST: 15 U/L (ref 15–41)
Albumin: 4 g/dL (ref 3.5–5.0)
Alkaline Phosphatase: 74 U/L (ref 38–126)
Anion gap: 14 (ref 5–15)
BUN: 21 mg/dL — ABNORMAL HIGH (ref 6–20)
CO2: 30 mmol/L (ref 22–32)
Calcium: 8.8 mg/dL — ABNORMAL LOW (ref 8.9–10.3)
Chloride: 94 mmol/L — ABNORMAL LOW (ref 98–111)
Creatinine: 7.79 mg/dL (ref 0.61–1.24)
GFR, Est AFR Am: 8 mL/min — ABNORMAL LOW (ref >60)
GFR, Estimated: 7 mL/min — ABNORMAL LOW (ref >60)
Glucose, Bld: 203 mg/dL — ABNORMAL HIGH (ref 70–99)
Potassium: 3.8 mmol/L (ref 3.5–5.1)
Sodium: 138 mmol/L (ref 135–145)
Total Bilirubin: 1 mg/dL (ref 0.3–1.2)
Total Protein: 7.7 g/dL (ref 6.5–8.1)

## 2018-09-28 LAB — CBC WITH DIFFERENTIAL (CANCER CENTER ONLY)
Abs Immature Granulocytes: 0.03 10*3/uL (ref 0.00–0.07)
Basophils Absolute: 0 10*3/uL (ref 0.0–0.1)
Basophils Relative: 1 %
Eosinophils Absolute: 0.1 10*3/uL (ref 0.0–0.5)
Eosinophils Relative: 2 %
HCT: 29.4 % — ABNORMAL LOW (ref 39.0–52.0)
Hemoglobin: 10.9 g/dL — ABNORMAL LOW (ref 13.0–17.0)
Immature Granulocytes: 1 %
Lymphocytes Relative: 0 %
Lymphs Abs: 0 10*3/uL — ABNORMAL LOW (ref 0.7–4.0)
MCH: 31.9 pg (ref 26.0–34.0)
MCHC: 37.1 g/dL — ABNORMAL HIGH (ref 30.0–36.0)
MCV: 86 fL (ref 80.0–100.0)
Monocytes Absolute: 0.1 10*3/uL (ref 0.1–1.0)
Monocytes Relative: 2 %
Neutro Abs: 5.1 10*3/uL (ref 1.7–7.7)
Neutrophils Relative %: 94 %
Platelet Count: 250 10*3/uL (ref 150–400)
RBC: 3.42 MIL/uL — ABNORMAL LOW (ref 4.22–5.81)
RDW: 17.2 % — ABNORMAL HIGH (ref 11.5–15.5)
WBC Count: 5.4 10*3/uL (ref 4.0–10.5)
nRBC: 11.6 % — ABNORMAL HIGH (ref 0.0–0.2)

## 2018-09-28 MED ORDER — BORTEZOMIB CHEMO SQ INJECTION 3.5 MG (2.5MG/ML)
1.5000 mg/m2 | Freq: Once | INTRAMUSCULAR | Status: AC
  Administered 2018-09-28: 3.25 mg via SUBCUTANEOUS
  Filled 2018-09-28: qty 1.3

## 2018-09-28 MED ORDER — PROCHLORPERAZINE MALEATE 10 MG PO TABS: 10.0000 mg | ORAL_TABLET | Freq: Once | ORAL | Status: DC

## 2018-09-28 NOTE — Progress Notes (Signed)
Per Dr. Alen Blew,  [place] to treat with lab results today.

## 2018-09-28 NOTE — Patient Instructions (Signed)
[facility] Discharge Instructions for Patients Receiving Chemotherapy  Today you received the following chemotherapy agents :  Velcade.  To help prevent nausea and vomiting after your treatment, we encourage you to take your nausea medication as prescribed.   If you develop nausea and vomiting that is not controlled by your nausea medication, call the clinic.   BELOW ARE SYMPTOMS THAT SHOULD BE REPORTED IMMEDIATELY:  *FEVER GREATER THAN 100.5 F  *CHILLS WITH OR WITHOUT FEVER  NAUSEA AND VOMITING THAT IS NOT CONTROLLED WITH YOUR NAUSEA MEDICATION  *UNUSUAL SHORTNESS OF BREATH  *UNUSUAL BRUISING OR BLEEDING  TENDERNESS IN MOUTH AND THROAT WITH OR WITHOUT PRESENCE OF ULCERS  *URINARY PROBLEMS  *BOWEL PROBLEMS  UNUSUAL RASH Items with * indicate a potential emergency and should be followed up as soon as possible.  Feel free to call the clinic should you have any questions or concerns. The clinic phone number is [phone].  Please show the [facility] at check-in to the Emergency Department and triage nurse.

## 2018-09-29 DIAGNOSIS — N186 End stage renal disease: Secondary | ICD-10-CM | POA: Diagnosis not present

## 2018-09-29 DIAGNOSIS — D631 Anemia in chronic kidney disease: Secondary | ICD-10-CM | POA: Diagnosis not present

## 2018-09-29 DIAGNOSIS — D509 Iron deficiency anemia, unspecified: Secondary | ICD-10-CM | POA: Diagnosis not present

## 2018-09-29 DIAGNOSIS — E1129 Type 2 diabetes mellitus with other diabetic kidney complication: Secondary | ICD-10-CM | POA: Diagnosis not present

## 2018-09-29 DIAGNOSIS — N2581 Secondary hyperparathyroidism of renal origin: Secondary | ICD-10-CM | POA: Diagnosis not present

## 2018-10-01 DIAGNOSIS — N186 End stage renal disease: Secondary | ICD-10-CM | POA: Diagnosis not present

## 2018-10-01 DIAGNOSIS — N2581 Secondary hyperparathyroidism of renal origin: Secondary | ICD-10-CM | POA: Diagnosis not present

## 2018-10-01 DIAGNOSIS — D631 Anemia in chronic kidney disease: Secondary | ICD-10-CM | POA: Diagnosis not present

## 2018-10-01 DIAGNOSIS — D509 Iron deficiency anemia, unspecified: Secondary | ICD-10-CM | POA: Diagnosis not present

## 2018-10-01 DIAGNOSIS — E1129 Type 2 diabetes mellitus with other diabetic kidney complication: Secondary | ICD-10-CM | POA: Diagnosis not present

## 2018-10-04 DIAGNOSIS — D509 Iron deficiency anemia, unspecified: Secondary | ICD-10-CM | POA: Diagnosis not present

## 2018-10-04 DIAGNOSIS — N186 End stage renal disease: Secondary | ICD-10-CM | POA: Diagnosis not present

## 2018-10-04 DIAGNOSIS — E1129 Type 2 diabetes mellitus with other diabetic kidney complication: Secondary | ICD-10-CM | POA: Diagnosis not present

## 2018-10-04 DIAGNOSIS — N2581 Secondary hyperparathyroidism of renal origin: Secondary | ICD-10-CM | POA: Diagnosis not present

## 2018-10-04 DIAGNOSIS — D631 Anemia in chronic kidney disease: Secondary | ICD-10-CM | POA: Diagnosis not present

## 2018-10-04 MED FILL — VIMPAT 150 MG TABLET: 150 | 30 days supply | Qty: 75 | Fill #2

## 2018-10-05 ENCOUNTER — Inpatient Hospital Stay: Payer: Medicare Other

## 2018-10-05 VITALS — BP 145/67 | HR 81 | Temp 97.8°F | Resp 17

## 2018-10-05 DIAGNOSIS — Z5112 Encounter for antineoplastic immunotherapy: Secondary | ICD-10-CM | POA: Diagnosis not present

## 2018-10-05 DIAGNOSIS — D472 Monoclonal gammopathy: Secondary | ICD-10-CM

## 2018-10-05 DIAGNOSIS — C9000 Multiple myeloma not having achieved remission: Secondary | ICD-10-CM | POA: Diagnosis not present

## 2018-10-05 LAB — CMP (CANCER CENTER ONLY)
ALT: 7 U/L (ref 0–44)
AST: 12 U/L — ABNORMAL LOW (ref 15–41)
Albumin: 3.9 g/dL (ref 3.5–5.0)
Alkaline Phosphatase: 81 U/L (ref 38–126)
Anion gap: 12 (ref 5–15)
BUN: 22 mg/dL — ABNORMAL HIGH (ref 6–20)
CO2: 32 mmol/L (ref 22–32)
Calcium: 9.4 mg/dL (ref 8.9–10.3)
Chloride: 95 mmol/L — ABNORMAL LOW (ref 98–111)
Creatinine: 7.75 mg/dL (ref 0.61–1.24)
GFR, Est AFR Am: 8 mL/min — ABNORMAL LOW (ref >60)
GFR, Estimated: 7 mL/min — ABNORMAL LOW (ref >60)
Glucose, Bld: 230 mg/dL — ABNORMAL HIGH (ref 70–99)
Potassium: 4.2 mmol/L (ref 3.5–5.1)
Sodium: 139 mmol/L (ref 135–145)
Total Bilirubin: 0.9 mg/dL (ref 0.3–1.2)
Total Protein: 7.8 g/dL (ref 6.5–8.1)

## 2018-10-05 LAB — CBC WITH DIFFERENTIAL (CANCER CENTER ONLY)
Abs Immature Granulocytes: 0.03 10*3/uL (ref 0.00–0.07)
Basophils Absolute: 0.1 10*3/uL (ref 0.0–0.1)
Basophils Relative: 1 %
Eosinophils Absolute: 0 10*3/uL (ref 0.0–0.5)
Eosinophils Relative: 0 %
HCT: 28.7 % — ABNORMAL LOW (ref 39.0–52.0)
Hemoglobin: 10.6 g/dL — ABNORMAL LOW (ref 13.0–17.0)
Immature Granulocytes: 0 %
Lymphocytes Relative: 0 %
Lymphs Abs: 0 10*3/uL — ABNORMAL LOW (ref 0.7–4.0)
MCH: 31.5 pg (ref 26.0–34.0)
MCHC: 36.9 g/dL — ABNORMAL HIGH (ref 30.0–36.0)
MCV: 85.2 fL (ref 80.0–100.0)
Monocytes Absolute: 0.1 10*3/uL (ref 0.1–1.0)
Monocytes Relative: 1 %
Neutro Abs: 6.6 10*3/uL (ref 1.7–7.7)
Neutrophils Relative %: 98 %
Platelet Count: 271 10*3/uL (ref 150–400)
RBC: 3.37 MIL/uL — ABNORMAL LOW (ref 4.22–5.81)
RDW: 17.7 % — ABNORMAL HIGH (ref 11.5–15.5)
WBC Count: 6.8 10*3/uL (ref 4.0–10.5)
nRBC: 8 % — ABNORMAL HIGH (ref 0.0–0.2)

## 2018-10-05 MED ORDER — PROCHLORPERAZINE MALEATE 10 MG PO TABS: 10.0000 mg | ORAL_TABLET | Freq: Once | ORAL | Status: DC

## 2018-10-05 MED ORDER — BORTEZOMIB CHEMO SQ INJECTION 3.5 MG (2.5MG/ML)
1.5000 mg/m2 | Freq: Once | INTRAMUSCULAR | Status: AC
  Administered 2018-10-05: 3.25 mg via SUBCUTANEOUS
  Filled 2018-10-05: qty 1.3

## 2018-10-05 NOTE — Patient Instructions (Signed)
[facility] Discharge Instructions for Patients Receiving Chemotherapy  Today you received the following chemotherapy agents :  Velcade.  To help prevent nausea and vomiting after your treatment, we encourage you to take your nausea medication as prescribed.   If you develop nausea and vomiting that is not controlled by your nausea medication, call the clinic.   BELOW ARE SYMPTOMS THAT SHOULD BE REPORTED IMMEDIATELY:  *FEVER GREATER THAN 100.5 F  *CHILLS WITH OR WITHOUT FEVER  NAUSEA AND VOMITING THAT IS NOT CONTROLLED WITH YOUR NAUSEA MEDICATION  *UNUSUAL SHORTNESS OF BREATH  *UNUSUAL BRUISING OR BLEEDING  TENDERNESS IN MOUTH AND THROAT WITH OR WITHOUT PRESENCE OF ULCERS  *URINARY PROBLEMS  *BOWEL PROBLEMS  UNUSUAL RASH Items with * indicate a potential emergency and should be followed up as soon as possible.  Feel free to call the clinic should you have any questions or concerns. The clinic phone number is [phone].  Please show the [facility] at check-in to the Emergency Department and triage nurse.

## 2018-10-06 DIAGNOSIS — N186 End stage renal disease: Secondary | ICD-10-CM | POA: Diagnosis not present

## 2018-10-06 DIAGNOSIS — D631 Anemia in chronic kidney disease: Secondary | ICD-10-CM | POA: Diagnosis not present

## 2018-10-06 DIAGNOSIS — E1129 Type 2 diabetes mellitus with other diabetic kidney complication: Secondary | ICD-10-CM | POA: Diagnosis not present

## 2018-10-06 DIAGNOSIS — N2581 Secondary hyperparathyroidism of renal origin: Secondary | ICD-10-CM | POA: Diagnosis not present

## 2018-10-06 DIAGNOSIS — D509 Iron deficiency anemia, unspecified: Secondary | ICD-10-CM | POA: Diagnosis not present

## 2018-10-06 LAB — KAPPA/LAMBDA LIGHT CHAINS
Kappa free light chain: 262 mg/L — ABNORMAL HIGH (ref 3.3–19.4)
Kappa, lambda light chain ratio: 3.85 — ABNORMAL HIGH (ref 0.26–1.65)
Lambda free light chains: 68.1 mg/L — ABNORMAL HIGH (ref 5.7–26.3)

## 2018-10-07 LAB — MULTIPLE MYELOMA PANEL, SERUM
Albumin SerPl Elph-Mcnc: 4 g/dL (ref 2.9–4.4)
Albumin/Glob SerPl: 1.4 (ref 0.7–1.7)
Alpha 1: 0.3 g/dL (ref 0.0–0.4)
Alpha2 Glob SerPl Elph-Mcnc: 0.6 g/dL (ref 0.4–1.0)
B-Globulin SerPl Elph-Mcnc: 0.7 g/dL (ref 0.7–1.3)
Gamma Glob SerPl Elph-Mcnc: 1.3 g/dL (ref 0.4–1.8)
Globulin, Total: 2.9 g/dL (ref 2.2–3.9)
IgA: 37 mg/dL — ABNORMAL LOW (ref 90–386)
IgG (Immunoglobin G), Serum: 1494 mg/dL (ref 700–1600)
IgM (Immunoglobulin M), Srm: 17 mg/dL — ABNORMAL LOW (ref 20–172)
M Protein SerPl Elph-Mcnc: 0.9 g/dL — ABNORMAL HIGH
Total Protein ELP: 6.9 g/dL (ref 6.0–8.5)

## 2018-10-07 MED FILL — CYCLOPHOSPHAMIDE 50 MG CAP: 50 | 28 days supply | Qty: 52 | Fill #1

## 2018-10-08 DIAGNOSIS — N2581 Secondary hyperparathyroidism of renal origin: Secondary | ICD-10-CM | POA: Diagnosis not present

## 2018-10-08 DIAGNOSIS — N186 End stage renal disease: Secondary | ICD-10-CM | POA: Diagnosis not present

## 2018-10-08 DIAGNOSIS — E1129 Type 2 diabetes mellitus with other diabetic kidney complication: Secondary | ICD-10-CM | POA: Diagnosis not present

## 2018-10-08 DIAGNOSIS — D631 Anemia in chronic kidney disease: Secondary | ICD-10-CM | POA: Diagnosis not present

## 2018-10-08 DIAGNOSIS — D509 Iron deficiency anemia, unspecified: Secondary | ICD-10-CM | POA: Diagnosis not present

## 2018-10-09 DIAGNOSIS — Z992 Dependence on renal dialysis: Secondary | ICD-10-CM | POA: Diagnosis not present

## 2018-10-09 DIAGNOSIS — E1122 Type 2 diabetes mellitus with diabetic chronic kidney disease: Secondary | ICD-10-CM | POA: Diagnosis not present

## 2018-10-09 DIAGNOSIS — N186 End stage renal disease: Secondary | ICD-10-CM | POA: Diagnosis not present

## 2018-10-11 ENCOUNTER — Other Ambulatory Visit: Payer: Self-pay

## 2018-10-11 DIAGNOSIS — E1129 Type 2 diabetes mellitus with other diabetic kidney complication: Secondary | ICD-10-CM | POA: Diagnosis not present

## 2018-10-11 DIAGNOSIS — N2581 Secondary hyperparathyroidism of renal origin: Secondary | ICD-10-CM | POA: Diagnosis not present

## 2018-10-11 DIAGNOSIS — D509 Iron deficiency anemia, unspecified: Secondary | ICD-10-CM | POA: Diagnosis not present

## 2018-10-11 DIAGNOSIS — N186 End stage renal disease: Secondary | ICD-10-CM | POA: Diagnosis not present

## 2018-10-11 DIAGNOSIS — D631 Anemia in chronic kidney disease: Secondary | ICD-10-CM | POA: Diagnosis not present

## 2018-10-11 DIAGNOSIS — C9000 Multiple myeloma not having achieved remission: Secondary | ICD-10-CM

## 2018-10-12 ENCOUNTER — Inpatient Hospital Stay: Payer: Medicare Other | Attending: Oncology

## 2018-10-12 ENCOUNTER — Inpatient Hospital Stay (HOSPITAL_BASED_OUTPATIENT_CLINIC_OR_DEPARTMENT_OTHER): Payer: Medicare Other | Admitting: Oncology

## 2018-10-12 ENCOUNTER — Inpatient Hospital Stay: Payer: Medicare Other

## 2018-10-12 ENCOUNTER — Ambulatory Visit: Payer: Medicare Other

## 2018-10-12 VITALS — BP 152/79 | HR 79 | Temp 98.4°F | Resp 18 | Ht 64.0 in | Wt 215.0 lb

## 2018-10-12 DIAGNOSIS — Z5112 Encounter for antineoplastic immunotherapy: Secondary | ICD-10-CM | POA: Insufficient documentation

## 2018-10-12 DIAGNOSIS — Z794 Long term (current) use of insulin: Secondary | ICD-10-CM | POA: Diagnosis not present

## 2018-10-12 DIAGNOSIS — M545 Low back pain: Secondary | ICD-10-CM

## 2018-10-12 DIAGNOSIS — N189 Chronic kidney disease, unspecified: Secondary | ICD-10-CM | POA: Insufficient documentation

## 2018-10-12 DIAGNOSIS — C9000 Multiple myeloma not having achieved remission: Secondary | ICD-10-CM

## 2018-10-12 DIAGNOSIS — Z992 Dependence on renal dialysis: Secondary | ICD-10-CM | POA: Insufficient documentation

## 2018-10-12 DIAGNOSIS — E119 Type 2 diabetes mellitus without complications: Secondary | ICD-10-CM | POA: Diagnosis not present

## 2018-10-12 DIAGNOSIS — E1122 Type 2 diabetes mellitus with diabetic chronic kidney disease: Secondary | ICD-10-CM | POA: Diagnosis not present

## 2018-10-12 LAB — CBC WITH DIFFERENTIAL (CANCER CENTER ONLY)
Abs Immature Granulocytes: 0.02 10*3/uL (ref 0.00–0.07)
Basophils Absolute: 0.1 10*3/uL (ref 0.0–0.1)
Basophils Relative: 1 %
Eosinophils Absolute: 0 10*3/uL (ref 0.0–0.5)
Eosinophils Relative: 1 %
HCT: 27.6 % — ABNORMAL LOW (ref 39.0–52.0)
Hemoglobin: 10.1 g/dL — ABNORMAL LOW (ref 13.0–17.0)
Immature Granulocytes: 0 %
Lymphocytes Relative: 0 %
Lymphs Abs: 0 10*3/uL — ABNORMAL LOW (ref 0.7–4.0)
MCH: 31.2 pg (ref 26.0–34.0)
MCHC: 36.6 g/dL — ABNORMAL HIGH (ref 30.0–36.0)
MCV: 85.2 fL (ref 80.0–100.0)
Monocytes Absolute: 0.2 10*3/uL (ref 0.1–1.0)
Monocytes Relative: 3 %
Neutro Abs: 6 10*3/uL (ref 1.7–7.7)
Neutrophils Relative %: 95 %
Platelet Count: 233 10*3/uL (ref 150–400)
RBC: 3.24 MIL/uL — ABNORMAL LOW (ref 4.22–5.81)
RDW: 17.7 % — ABNORMAL HIGH (ref 11.5–15.5)
WBC Count: 6.3 10*3/uL (ref 4.0–10.5)
nRBC: 10.5 % — ABNORMAL HIGH (ref 0.0–0.2)

## 2018-10-12 LAB — CMP (CANCER CENTER ONLY)
ALT: <6 U/L (ref 0–44)
AST: 12 U/L — ABNORMAL LOW (ref 15–41)
Albumin: 3.8 g/dL (ref 3.5–5.0)
Alkaline Phosphatase: 85 U/L (ref 38–126)
Anion gap: 10 (ref 5–15)
BUN: 31 mg/dL — ABNORMAL HIGH (ref 6–20)
CO2: 33 mmol/L — ABNORMAL HIGH (ref 22–32)
Calcium: 9.1 mg/dL (ref 8.9–10.3)
Chloride: 97 mmol/L — ABNORMAL LOW (ref 98–111)
Creatinine: 8.07 mg/dL (ref 0.61–1.24)
GFR, Est AFR Am: 8 mL/min — ABNORMAL LOW (ref >60)
GFR, Estimated: 7 mL/min — ABNORMAL LOW (ref >60)
Glucose, Bld: 231 mg/dL — ABNORMAL HIGH (ref 70–99)
Potassium: 4.4 mmol/L (ref 3.5–5.1)
Sodium: 140 mmol/L (ref 135–145)
Total Bilirubin: 1 mg/dL (ref 0.3–1.2)
Total Protein: 7.3 g/dL (ref 6.5–8.1)

## 2018-10-12 MED ORDER — BORTEZOMIB CHEMO SQ INJECTION 3.5 MG (2.5MG/ML)
1.5000 mg/m2 | Freq: Once | INTRAMUSCULAR | Status: AC
  Administered 2018-10-12: 3.25 mg via SUBCUTANEOUS
  Filled 2018-10-12: qty 1.3

## 2018-10-12 MED ORDER — PROCHLORPERAZINE MALEATE 10 MG PO TABS: 10.0000 mg | ORAL_TABLET | Freq: Once | ORAL | Status: DC

## 2018-10-12 NOTE — Patient Instructions (Signed)
[facility] Discharge Instructions for Patients Receiving Chemotherapy  Today you received the following chemotherapy agent:  Velcade.  To help prevent nausea and vomiting after your treatment, we encourage you to take your nausea medication as prescribed.   If you develop nausea and vomiting that is not controlled by your nausea medication, call the clinic.   BELOW ARE SYMPTOMS THAT SHOULD BE REPORTED IMMEDIATELY:  *FEVER GREATER THAN 100.5 F  *CHILLS WITH OR WITHOUT FEVER  NAUSEA AND VOMITING THAT IS NOT CONTROLLED WITH YOUR NAUSEA MEDICATION  *UNUSUAL SHORTNESS OF BREATH  *UNUSUAL BRUISING OR BLEEDING  TENDERNESS IN MOUTH AND THROAT WITH OR WITHOUT PRESENCE OF ULCERS  *URINARY PROBLEMS  *BOWEL PROBLEMS  UNUSUAL RASH Items with * indicate a potential emergency and should be followed up as soon as possible.  Feel free to call the clinic should you have any questions or concerns. The clinic phone number is [phone].  Please show the [facility] at check-in to the Emergency Department and triage nurse.

## 2018-10-12 NOTE — Progress Notes (Signed)
Hematology and Oncology Follow Up Visit  [place] [id number] 1966-06-13 53 y.o. 10/12/2018 11:53 AM    Principle Diagnosis: 53 year old man with  1.  IgG kappa multiple myeloma diagnosed in September 2019. He was found to have 20% plasma infiltration in the bone marrow after having smoldering multiple myeloma previously.  2.  Chronic renal failure: Currently hemodialysis dependent without any association with plasma cell disorder.  Current therapy: Velcade dexamethasone and cyclophosphamide weekly started on 05/25/2018.  Velcade is given at 1.5 mg/m weekly, dexamethasone is 20 mg weekly and and Cytoxan is given at 650 mg total dose.  He is under evaluation for the next cycle of therapy.  Interim History:  Isaiah [name] is here for a repeat evaluation by himself.  Since last visit, he continues to tolerate therapy without any recent issues or complaints.  He denied any nausea or infusion related complications.  Continues to take cyclophosphamide and dexamethasone without any GI issues.  He denies any recent hospitalization or illnesses.  He has reported intermittent lower back pain not associated with any neurological deficits or ambulation issues.  He has reported some discomfort especially at nighttime when laying flat.  He denies any peripheral neuropathy or recent hospitalizations.  His performance status and quality of life remains unchanged.   Patient denied any alteration mental status, neuropathy, confusion or dizziness.  Denies any headaches or lethargy.  Denies any night sweats, weight loss or changes in appetite.  Denied orthopnea, dyspnea on exertion or chest discomfort.  Denies shortness of breath, difficulty breathing hemoptysis or cough.  Denies any abdominal distention, nausea, early satiety or dyspepsia.  Denies any hematuria, frequency, dysuria or nocturia.  Denies any skin irritation, dryness or rash.  Denies any ecchymosis or petechiae.  Denies any lymphadenopathy or clotting.   Denies any heat or cold intolerance.  Denies any anxiety or depression.  Remaining review of system is negative.     Medications: I have reviewed the patient's current medications.  Current Outpatient Medications:  .  acyclovir (ZOVIRAX) 400 MG tablet, TAKE 1 TABLET (400 MG TOTAL) BY MOUTH DAILY., Disp: 90 tablet, Rfl: 0 .  albuterol (PROAIR HFA) 108 (90 Base) MCG/ACT inhaler, Inhale two puffs every 4-6 hours if needed for cough or wheeze, Disp: 1 Inhaler, Rfl: 1 .  aspirin EC 81 MG tablet, Take 1 tablet (81 mg total) by mouth daily., Disp: 100 tablet, Rfl: 0 .  calcium acetate (PHOSLO) 667 MG capsule, Take 2,001 mg by mouth 3 (three) times daily with meals. , Disp: , Rfl:  .  Cetirizine HCl 10 MG CAPS, Take 1 capsule by mouth daily., Disp: , Rfl:  .  cinacalcet (SENSIPAR) 60 MG tablet, Take 60 mg by mouth every other day. , Disp: , Rfl:  .  clonazePAM (KLONOPIN) 0.5 MG tablet, Take 1 tablet prior to each dialysis session, Disp: 15 tablet, Rfl: 5 .  cyclophosphamide (CYTOXAN) 50 MG capsule, TAKE 13 CAPSULES (650MG) BY MOUTH ONCE WEEKLY, TAKE EARLY IN THE DAY, Disp: 52 capsule, Rfl: 3 .  dexamethasone (DECADRON) 4 MG tablet, Take 5 tablets weekly, Disp: 90 tablet, Rfl: 3 .  fluticasone furoate-vilanterol (BREO ELLIPTA) 100-25 MCG/INH AEPB, Inhale one puff once daily to prevent cough or wheeze, Disp: 60 each, Rfl: 5 .  insulin lispro (HUMALOG) 100 UNIT/ML injection, Inject into the skin 3 (three) times daily before meals. 3 to 4 units TID prn SS, Disp: , Rfl:  .  Lacosamide (VIMPAT) 150 MG TABS, Take 1 tablet twice a  day. Take an additional 1 tablet after dialysis session three times a week., Disp: 75 tablet, Rfl: 5 .  lamoTRIgine (LAMICTAL) 150 MG tablet, Take 1 tablet (150 mg total) by mouth 2 (two) times daily., Disp: 180 tablet, Rfl: 3 .  metoprolol tartrate (LOPRESSOR) 25 MG tablet, Take 0.5 tablets (12.5 mg total) by mouth 2 (two) times daily., Disp: 60 tablet, Rfl: 0 .  midodrine  (PROAMATINE) 10 MG tablet, Take 10 mg by mouth 2 (two) times daily with a meal. , Disp: , Rfl:  .  multivitamin (RENA-VIT) TABS tablet, Take 1 tablet by mouth at bedtime., Disp: 30 tablet, Rfl: 1 .  pantoprazole (PROTONIX) 40 MG tablet, Take 1 tablet (40 mg total) by mouth 2 (two) times daily. (Patient taking differently: Take 40 mg by mouth every morning. ), Disp: 60 tablet, Rfl: 1 .  prochlorperazine (COMPAZINE) 10 MG tablet, Take 1 tablet (10 mg total) by mouth every 6 (six) hours as needed for nausea or vomiting., Disp: 30 tablet, Rfl: 0  Allergies:  Allergies  Allergen Reactions  . Codeine Rash    Unknown reaction (patient says it was more serious than just a rash, but he can't remember what happened)    Past Medical History, Surgical history, Social history, and Family History were reviewed and updated.  Physical Exam:  Blood pressure (!) 152/79, pulse 79, temperature 98.4 F (36.9 C), temperature source Oral, resp. rate 18, height 5' 4"  (1.626 m), weight 215 lb (97.5 kg), SpO2 95 %.      ECOG: 1    General appearance: Alert, awake without any distress. Head: Atraumatic without abnormalities Oropharynx: Without any thrush or ulcers. Eyes: No scleral icterus. Lymph nodes: No lymphadenopathy noted in the cervical, supraclavicular, or axillary nodes Heart:regular rate and rhythm, without any murmurs or gallops.   Lung: Clear to auscultation without any rhonchi, wheezes or dullness to percussion. Abdomin: Soft, nontender without any shifting dullness or ascites. Musculoskeletal: No clubbing or cyanosis. Neurological: No motor or sensory deficits.  Ambulating without any difficulties. Skin: No rashes or lesions.        Lab Results: Lab Results  Component Value Date   WBC 6.3 10/12/2018   HGB 10.1 (L) 10/12/2018   HCT 27.6 (L) 10/12/2018   MCV 85.2 10/12/2018   PLT 233 10/12/2018     Chemistry      Component Value Date/Time   NA 139 10/05/2018 1310   NA 138  07/03/2017 0813   K 4.2 10/05/2018 1310   K 4.2 07/03/2017 0813   CL 95 (L) 10/05/2018 1310   CO2 32 10/05/2018 1310   CO2 27 07/03/2017 0813   BUN 22 (H) 10/05/2018 1310   BUN 46.4 (H) 07/03/2017 0813   CREATININE 7.75 (HH) 10/05/2018 1310   CREATININE 10.9 (HH) 07/03/2017 0813      Component Value Date/Time   CALCIUM 9.4 10/05/2018 1310   CALCIUM 9.3 07/03/2017 0813   ALKPHOS 81 10/05/2018 1310   ALKPHOS 77 07/03/2017 0813   AST 12 (L) 10/05/2018 1310   AST 16 07/03/2017 0813   ALT 7 10/05/2018 1310   ALT 14 07/03/2017 0813   BILITOT 0.9 10/05/2018 1310   BILITOT 0.93 07/03/2017 0813        Results for Isaiah [name], Isaiah [name] (MRN [id number]) as of 10/12/2018 11:42  Ref. Range 09/07/2018 12:12 10/05/2018 13:10  M Protein SerPl Elph-Mcnc Latest Ref Range: Not Observed g/dL 1.3 (H) 0.9 (H)    Results for Isaiah [name], Isaiah [name] (MRN [id number])  as of 10/12/2018 11:42  Ref. Range 09/07/2018 12:12 10/05/2018 13:10  IgG (Immunoglobin G), Serum Latest Ref Range: 700 - 1,600 mg/dL 1,935 (H) 1,494     Impression and Plan:  53 year old man with  1.  IgG kappa multiple myeloma evolving from MGUS diagnosed in 2012 and became symptomatic myeloma in 2019.   He has tolerated Velcade, dexamethasone and cyclophosphamide without any major complications.  Protein studies obtained on October 05, 2018 were reviewed today and continues to show reasonable response to therapy.  His M protein is down to 0.9 g/dL which is down from 4.0 at the time of diagnosis.  His IgG level is back to normal with persistent kappa to lambda elevation on his free light chains.  Risks and benefits of continuing this therapy for the time being was discussed.  I recommended proceeding with another month of therapy and reevaluate him at that time.  I anticipate he will likely need to complete 6 months of therapy to get a deep response before consideration for transplant.     2. Anemia: Hemoglobin is close to normal range.  Does not  require any transfusion.  3. Renal failure: He remains on hemodialysis without any issues.  4.  Zoster prophylaxis: No reactivation noted at this time and remains on acyclovir.  5.  Diabetes: No recent exacerbation noted on dexamethasone.  6.  Nausea prophylaxis: Compazine is available to him without any issues.  7.  Back pain: Unclear etiology at this time.  I will obtain imaging studies including a skeletal survey to rule out any lytic myeloma lesions.  8.  Goals of care: His disease might not be curable but certainly treatable and his performance status remains excellent and aggressive therapy is warranted.  9. Follow-up: Every week for Velcade chemotherapy and Follow-up in 4 weeks.  25  minutes was spent with the patient face-to-face today.  More than 50% of time was dedicated to reviewing his disease status, laboratory data, options of treatment and complications related therapy.Zola Button, MD 2/4/202011:53 AM

## 2018-10-13 ENCOUNTER — Ambulatory Visit: Payer: Medicare Other

## 2018-10-13 DIAGNOSIS — N186 End stage renal disease: Secondary | ICD-10-CM | POA: Diagnosis not present

## 2018-10-13 DIAGNOSIS — N2581 Secondary hyperparathyroidism of renal origin: Secondary | ICD-10-CM | POA: Diagnosis not present

## 2018-10-13 DIAGNOSIS — E1129 Type 2 diabetes mellitus with other diabetic kidney complication: Secondary | ICD-10-CM | POA: Diagnosis not present

## 2018-10-13 DIAGNOSIS — D631 Anemia in chronic kidney disease: Secondary | ICD-10-CM | POA: Diagnosis not present

## 2018-10-13 DIAGNOSIS — D509 Iron deficiency anemia, unspecified: Secondary | ICD-10-CM | POA: Diagnosis not present

## 2018-10-14 ENCOUNTER — Telehealth: Payer: Self-pay

## 2018-10-14 DIAGNOSIS — E109 Type 1 diabetes mellitus without complications: Secondary | ICD-10-CM | POA: Diagnosis not present

## 2018-10-14 NOTE — Telephone Encounter (Signed)
Spoke with [name] concerning patient upcoming appointment. Per 2/4 los. Mailed calender

## 2018-10-15 DIAGNOSIS — E1129 Type 2 diabetes mellitus with other diabetic kidney complication: Secondary | ICD-10-CM | POA: Diagnosis not present

## 2018-10-15 DIAGNOSIS — N186 End stage renal disease: Secondary | ICD-10-CM | POA: Diagnosis not present

## 2018-10-15 DIAGNOSIS — D631 Anemia in chronic kidney disease: Secondary | ICD-10-CM | POA: Diagnosis not present

## 2018-10-15 DIAGNOSIS — D509 Iron deficiency anemia, unspecified: Secondary | ICD-10-CM | POA: Diagnosis not present

## 2018-10-15 DIAGNOSIS — N2581 Secondary hyperparathyroidism of renal origin: Secondary | ICD-10-CM | POA: Diagnosis not present

## 2018-10-18 DIAGNOSIS — N186 End stage renal disease: Secondary | ICD-10-CM | POA: Diagnosis not present

## 2018-10-18 DIAGNOSIS — N2581 Secondary hyperparathyroidism of renal origin: Secondary | ICD-10-CM | POA: Diagnosis not present

## 2018-10-18 DIAGNOSIS — E1129 Type 2 diabetes mellitus with other diabetic kidney complication: Secondary | ICD-10-CM | POA: Diagnosis not present

## 2018-10-18 DIAGNOSIS — D631 Anemia in chronic kidney disease: Secondary | ICD-10-CM | POA: Diagnosis not present

## 2018-10-18 DIAGNOSIS — D509 Iron deficiency anemia, unspecified: Secondary | ICD-10-CM | POA: Diagnosis not present

## 2018-10-19 ENCOUNTER — Inpatient Hospital Stay: Payer: Medicare Other

## 2018-10-19 VITALS — BP 148/71 | HR 72 | Temp 98.4°F | Resp 18

## 2018-10-19 DIAGNOSIS — Z794 Long term (current) use of insulin: Secondary | ICD-10-CM | POA: Diagnosis not present

## 2018-10-19 DIAGNOSIS — Z5112 Encounter for antineoplastic immunotherapy: Secondary | ICD-10-CM | POA: Diagnosis not present

## 2018-10-19 DIAGNOSIS — Z992 Dependence on renal dialysis: Secondary | ICD-10-CM | POA: Diagnosis not present

## 2018-10-19 DIAGNOSIS — M545 Low back pain: Secondary | ICD-10-CM | POA: Diagnosis not present

## 2018-10-19 DIAGNOSIS — C9000 Multiple myeloma not having achieved remission: Secondary | ICD-10-CM

## 2018-10-19 DIAGNOSIS — N189 Chronic kidney disease, unspecified: Secondary | ICD-10-CM | POA: Diagnosis not present

## 2018-10-19 DIAGNOSIS — E1122 Type 2 diabetes mellitus with diabetic chronic kidney disease: Secondary | ICD-10-CM | POA: Diagnosis not present

## 2018-10-19 LAB — CBC WITH DIFFERENTIAL (CANCER CENTER ONLY)
Abs Immature Granulocytes: 0.02 10*3/uL (ref 0.00–0.07)
Basophils Absolute: 0.1 10*3/uL (ref 0.0–0.1)
Basophils Relative: 1 %
Eosinophils Absolute: 0.1 10*3/uL (ref 0.0–0.5)
Eosinophils Relative: 1 %
HCT: 26.3 % — ABNORMAL LOW (ref 39.0–52.0)
Hemoglobin: 9.6 g/dL — ABNORMAL LOW (ref 13.0–17.0)
Immature Granulocytes: 0 %
Lymphocytes Relative: 0 %
Lymphs Abs: 0 10*3/uL — ABNORMAL LOW (ref 0.7–4.0)
MCH: 30.8 pg (ref 26.0–34.0)
MCHC: 36.5 g/dL — ABNORMAL HIGH (ref 30.0–36.0)
MCV: 84.3 fL (ref 80.0–100.0)
Monocytes Absolute: 0.2 10*3/uL (ref 0.1–1.0)
Monocytes Relative: 2 %
Neutro Abs: 8.7 10*3/uL — ABNORMAL HIGH (ref 1.7–7.7)
Neutrophils Relative %: 96 %
Platelet Count: 230 10*3/uL (ref 150–400)
RBC: 3.12 MIL/uL — ABNORMAL LOW (ref 4.22–5.81)
RDW: 18.1 % — ABNORMAL HIGH (ref 11.5–15.5)
WBC Count: 9 10*3/uL (ref 4.0–10.5)
nRBC: 6.5 % — ABNORMAL HIGH (ref 0.0–0.2)

## 2018-10-19 LAB — CMP (CANCER CENTER ONLY)
ALT: <6 U/L (ref 0–44)
AST: 12 U/L — ABNORMAL LOW (ref 15–41)
Albumin: 3.7 g/dL (ref 3.5–5.0)
Alkaline Phosphatase: 77 U/L (ref 38–126)
Anion gap: 11 (ref 5–15)
BUN: 24 mg/dL — ABNORMAL HIGH (ref 6–20)
CO2: 33 mmol/L — ABNORMAL HIGH (ref 22–32)
Calcium: 9 mg/dL (ref 8.9–10.3)
Chloride: 96 mmol/L — ABNORMAL LOW (ref 98–111)
Creatinine: 7.12 mg/dL (ref 0.61–1.24)
GFR, Est AFR Am: 9 mL/min — ABNORMAL LOW (ref >60)
GFR, Estimated: 8 mL/min — ABNORMAL LOW (ref >60)
Glucose, Bld: 188 mg/dL — ABNORMAL HIGH (ref 70–99)
Potassium: 4.2 mmol/L (ref 3.5–5.1)
Sodium: 140 mmol/L (ref 135–145)
Total Bilirubin: 0.8 mg/dL (ref 0.3–1.2)
Total Protein: 7.2 g/dL (ref 6.5–8.1)

## 2018-10-19 MED ORDER — BORTEZOMIB CHEMO SQ INJECTION 3.5 MG (2.5MG/ML)
1.5000 mg/m2 | Freq: Once | INTRAMUSCULAR | Status: AC
  Administered 2018-10-19: 3.25 mg via SUBCUTANEOUS
  Filled 2018-10-19: qty 1.3

## 2018-10-19 MED ORDER — PROCHLORPERAZINE MALEATE 10 MG PO TABS
ORAL_TABLET | ORAL | Status: AC
  Filled 2018-10-19: qty 1

## 2018-10-19 NOTE — Patient Instructions (Signed)
[facility] Discharge Instructions for Patients Receiving Chemotherapy  Today you received the following chemotherapy agent:  Velcade.  To help prevent nausea and vomiting after your treatment, we encourage you to take your nausea medication as prescribed.   If you develop nausea and vomiting that is not controlled by your nausea medication, call the clinic.   BELOW ARE SYMPTOMS THAT SHOULD BE REPORTED IMMEDIATELY:  *FEVER GREATER THAN 100.5 F  *CHILLS WITH OR WITHOUT FEVER  NAUSEA AND VOMITING THAT IS NOT CONTROLLED WITH YOUR NAUSEA MEDICATION  *UNUSUAL SHORTNESS OF BREATH  *UNUSUAL BRUISING OR BLEEDING  TENDERNESS IN MOUTH AND THROAT WITH OR WITHOUT PRESENCE OF ULCERS  *URINARY PROBLEMS  *BOWEL PROBLEMS  UNUSUAL RASH Items with * indicate a potential emergency and should be followed up as soon as possible.  Feel free to call the clinic should you have any questions or concerns. The clinic phone number is [phone].  Please show the [facility] at check-in to the Emergency Department and triage nurse.

## 2018-10-20 DIAGNOSIS — E1129 Type 2 diabetes mellitus with other diabetic kidney complication: Secondary | ICD-10-CM | POA: Diagnosis not present

## 2018-10-20 DIAGNOSIS — D509 Iron deficiency anemia, unspecified: Secondary | ICD-10-CM | POA: Diagnosis not present

## 2018-10-20 DIAGNOSIS — D631 Anemia in chronic kidney disease: Secondary | ICD-10-CM | POA: Diagnosis not present

## 2018-10-20 DIAGNOSIS — N186 End stage renal disease: Secondary | ICD-10-CM | POA: Diagnosis not present

## 2018-10-20 DIAGNOSIS — N2581 Secondary hyperparathyroidism of renal origin: Secondary | ICD-10-CM | POA: Diagnosis not present

## 2018-10-21 ENCOUNTER — Ambulatory Visit (HOSPITAL_COMMUNITY)
Admission: RE | Admit: 2018-10-21 | Discharge: 2018-10-21 | Disposition: A | Discharge status: Home / Self Care | Payer: Medicare Other | Source: Ambulatory Visit | Attending: Oncology | Admitting: Oncology

## 2018-10-21 DIAGNOSIS — C9000 Multiple myeloma not having achieved remission: Secondary | ICD-10-CM | POA: Insufficient documentation

## 2018-10-22 DIAGNOSIS — D509 Iron deficiency anemia, unspecified: Secondary | ICD-10-CM | POA: Diagnosis not present

## 2018-10-22 DIAGNOSIS — D631 Anemia in chronic kidney disease: Secondary | ICD-10-CM | POA: Diagnosis not present

## 2018-10-22 DIAGNOSIS — N186 End stage renal disease: Secondary | ICD-10-CM | POA: Diagnosis not present

## 2018-10-22 DIAGNOSIS — N2581 Secondary hyperparathyroidism of renal origin: Secondary | ICD-10-CM | POA: Diagnosis not present

## 2018-10-22 DIAGNOSIS — E1129 Type 2 diabetes mellitus with other diabetic kidney complication: Secondary | ICD-10-CM | POA: Diagnosis not present

## 2018-10-25 ENCOUNTER — Other Ambulatory Visit: Payer: Self-pay | Admitting: Oncology

## 2018-10-25 DIAGNOSIS — E1129 Type 2 diabetes mellitus with other diabetic kidney complication: Secondary | ICD-10-CM | POA: Diagnosis not present

## 2018-10-25 DIAGNOSIS — N2581 Secondary hyperparathyroidism of renal origin: Secondary | ICD-10-CM | POA: Diagnosis not present

## 2018-10-25 DIAGNOSIS — N186 End stage renal disease: Secondary | ICD-10-CM | POA: Diagnosis not present

## 2018-10-25 DIAGNOSIS — D509 Iron deficiency anemia, unspecified: Secondary | ICD-10-CM | POA: Diagnosis not present

## 2018-10-25 DIAGNOSIS — D631 Anemia in chronic kidney disease: Secondary | ICD-10-CM | POA: Diagnosis not present

## 2018-10-25 MED FILL — CALCIUM ACETATE 667 MG CAP: 667 | 30 days supply | Qty: 390 | Fill #0

## 2018-10-25 MED FILL — BREO ELLIPTA 200-25 MCG INH: 200-25 | 30 days supply | Qty: 60 | Fill #1

## 2018-10-26 ENCOUNTER — Inpatient Hospital Stay: Payer: Medicare Other

## 2018-10-26 VITALS — BP 157/79 | HR 78 | Temp 98.0°F | Resp 20 | Wt 212.8 lb

## 2018-10-26 DIAGNOSIS — C9000 Multiple myeloma not having achieved remission: Secondary | ICD-10-CM

## 2018-10-26 DIAGNOSIS — Z5112 Encounter for antineoplastic immunotherapy: Secondary | ICD-10-CM | POA: Diagnosis not present

## 2018-10-26 DIAGNOSIS — E1122 Type 2 diabetes mellitus with diabetic chronic kidney disease: Secondary | ICD-10-CM | POA: Diagnosis not present

## 2018-10-26 DIAGNOSIS — M545 Low back pain: Secondary | ICD-10-CM | POA: Diagnosis not present

## 2018-10-26 DIAGNOSIS — Z794 Long term (current) use of insulin: Secondary | ICD-10-CM | POA: Diagnosis not present

## 2018-10-26 DIAGNOSIS — N189 Chronic kidney disease, unspecified: Secondary | ICD-10-CM | POA: Diagnosis not present

## 2018-10-26 DIAGNOSIS — Z992 Dependence on renal dialysis: Secondary | ICD-10-CM | POA: Diagnosis not present

## 2018-10-26 LAB — CBC WITH DIFFERENTIAL (CANCER CENTER ONLY)
Abs Immature Granulocytes: 0.02 10*3/uL (ref 0.00–0.07)
Basophils Absolute: 0.1 10*3/uL (ref 0.0–0.1)
Basophils Relative: 1 %
Eosinophils Absolute: 0 10*3/uL (ref 0.0–0.5)
Eosinophils Relative: 0 %
HCT: 27.6 % — ABNORMAL LOW (ref 39.0–52.0)
Hemoglobin: 10.2 g/dL — ABNORMAL LOW (ref 13.0–17.0)
Immature Granulocytes: 0 %
Lymphocytes Relative: 0 %
Lymphs Abs: 0 10*3/uL — ABNORMAL LOW (ref 0.7–4.0)
MCH: 30.9 pg (ref 26.0–34.0)
MCHC: 37 g/dL — ABNORMAL HIGH (ref 30.0–36.0)
MCV: 83.6 fL (ref 80.0–100.0)
Monocytes Absolute: 0.2 10*3/uL (ref 0.1–1.0)
Monocytes Relative: 2 %
Neutro Abs: 7.2 10*3/uL (ref 1.7–7.7)
Neutrophils Relative %: 97 %
Platelet Count: 290 10*3/uL (ref 150–400)
RBC: 3.3 MIL/uL — ABNORMAL LOW (ref 4.22–5.81)
RDW: 18.1 % — ABNORMAL HIGH (ref 11.5–15.5)
WBC Count: 7.5 10*3/uL (ref 4.0–10.5)
nRBC: 9.5 % — ABNORMAL HIGH (ref 0.0–0.2)

## 2018-10-26 LAB — CMP (CANCER CENTER ONLY)
ALT: 6 U/L (ref 0–44)
AST: 13 U/L — ABNORMAL LOW (ref 15–41)
Albumin: 4 g/dL (ref 3.5–5.0)
Alkaline Phosphatase: 82 U/L (ref 38–126)
Anion gap: 11 (ref 5–15)
BUN: 24 mg/dL — ABNORMAL HIGH (ref 6–20)
CO2: 32 mmol/L (ref 22–32)
Calcium: 9.3 mg/dL (ref 8.9–10.3)
Chloride: 97 mmol/L — ABNORMAL LOW (ref 98–111)
Creatinine: 7 mg/dL (ref 0.61–1.24)
GFR, Est AFR Am: 9 mL/min — ABNORMAL LOW (ref >60)
GFR, Estimated: 8 mL/min — ABNORMAL LOW (ref >60)
Glucose, Bld: 208 mg/dL — ABNORMAL HIGH (ref 70–99)
Potassium: 4.1 mmol/L (ref 3.5–5.1)
Sodium: 140 mmol/L (ref 135–145)
Total Bilirubin: 1 mg/dL (ref 0.3–1.2)
Total Protein: 7.7 g/dL (ref 6.5–8.1)

## 2018-10-26 MED ORDER — BORTEZOMIB CHEMO SQ INJECTION 3.5 MG (2.5MG/ML)
1.5000 mg/m2 | Freq: Once | INTRAMUSCULAR | Status: AC
  Administered 2018-10-26: 3.25 mg via SUBCUTANEOUS
  Filled 2018-10-26: qty 1.3

## 2018-10-26 MED ORDER — PROCHLORPERAZINE MALEATE 10 MG PO TABS: 10.0000 mg | ORAL_TABLET | Freq: Once | ORAL | Status: DC

## 2018-10-26 MED FILL — PROCHLORPERAZINE 10 MG TAB: 10 | 7 days supply | Qty: 30 | Fill #0

## 2018-10-26 MED FILL — ACYCLOVIR 400 MG TABLET: 400 | 90 days supply | Qty: 90 | Fill #0

## 2018-10-26 MED FILL — PANTOPRAZOLE SOD DR 40 MG T: 40 | 90 days supply | Qty: 90 | Fill #0

## 2018-10-26 NOTE — Patient Instructions (Signed)
[facility] Discharge Instructions for Patients Receiving Chemotherapy  Today you received the following chemotherapy agents:  Velcade  To help prevent nausea and vomiting after your treatment, we encourage you to take your nausea medication as prescribed.   If you develop nausea and vomiting that is not controlled by your nausea medication, call the clinic.   BELOW ARE SYMPTOMS THAT SHOULD BE REPORTED IMMEDIATELY:  *FEVER GREATER THAN 100.5 F  *CHILLS WITH OR WITHOUT FEVER  NAUSEA AND VOMITING THAT IS NOT CONTROLLED WITH YOUR NAUSEA MEDICATION  *UNUSUAL SHORTNESS OF BREATH  *UNUSUAL BRUISING OR BLEEDING  TENDERNESS IN MOUTH AND THROAT WITH OR WITHOUT PRESENCE OF ULCERS  *URINARY PROBLEMS  *BOWEL PROBLEMS  UNUSUAL RASH Items with * indicate a potential emergency and should be followed up as soon as possible.  Feel free to call the clinic should you have any questions or concerns. The clinic phone number is [phone].  Please show the [facility] at check-in to the Emergency Department and triage nurse.

## 2018-10-27 ENCOUNTER — Telehealth: Payer: Self-pay

## 2018-10-27 DIAGNOSIS — D509 Iron deficiency anemia, unspecified: Secondary | ICD-10-CM | POA: Diagnosis not present

## 2018-10-27 DIAGNOSIS — N2581 Secondary hyperparathyroidism of renal origin: Secondary | ICD-10-CM | POA: Diagnosis not present

## 2018-10-27 DIAGNOSIS — D631 Anemia in chronic kidney disease: Secondary | ICD-10-CM | POA: Diagnosis not present

## 2018-10-27 DIAGNOSIS — E1129 Type 2 diabetes mellitus with other diabetic kidney complication: Secondary | ICD-10-CM | POA: Diagnosis not present

## 2018-10-27 DIAGNOSIS — N186 End stage renal disease: Secondary | ICD-10-CM | POA: Diagnosis not present

## 2018-10-27 NOTE — Telephone Encounter (Signed)
Contacted patient and provided results for the bone survey to the patient spouse. She was appreciative of the call and had no other questions or concerns.

## 2018-10-27 NOTE — Telephone Encounter (Signed)
-----   Message from Wyatt Portela, MD sent at 10/25/2018  5:58 PM EST ----- Please let him know his xrays are normal.

## 2018-10-29 DIAGNOSIS — N2581 Secondary hyperparathyroidism of renal origin: Secondary | ICD-10-CM | POA: Diagnosis not present

## 2018-10-29 DIAGNOSIS — E1129 Type 2 diabetes mellitus with other diabetic kidney complication: Secondary | ICD-10-CM | POA: Diagnosis not present

## 2018-10-29 DIAGNOSIS — D631 Anemia in chronic kidney disease: Secondary | ICD-10-CM | POA: Diagnosis not present

## 2018-10-29 DIAGNOSIS — N186 End stage renal disease: Secondary | ICD-10-CM | POA: Diagnosis not present

## 2018-10-29 DIAGNOSIS — D509 Iron deficiency anemia, unspecified: Secondary | ICD-10-CM | POA: Diagnosis not present

## 2018-11-01 DIAGNOSIS — N2581 Secondary hyperparathyroidism of renal origin: Secondary | ICD-10-CM | POA: Diagnosis not present

## 2018-11-01 DIAGNOSIS — D509 Iron deficiency anemia, unspecified: Secondary | ICD-10-CM | POA: Diagnosis not present

## 2018-11-01 DIAGNOSIS — E1129 Type 2 diabetes mellitus with other diabetic kidney complication: Secondary | ICD-10-CM | POA: Diagnosis not present

## 2018-11-01 DIAGNOSIS — D631 Anemia in chronic kidney disease: Secondary | ICD-10-CM | POA: Diagnosis not present

## 2018-11-01 DIAGNOSIS — N186 End stage renal disease: Secondary | ICD-10-CM | POA: Diagnosis not present

## 2018-11-02 ENCOUNTER — Inpatient Hospital Stay: Payer: Medicare Other

## 2018-11-02 VITALS — BP 153/83 | HR 78 | Temp 98.4°F | Resp 20

## 2018-11-02 DIAGNOSIS — C9000 Multiple myeloma not having achieved remission: Secondary | ICD-10-CM

## 2018-11-02 DIAGNOSIS — E1122 Type 2 diabetes mellitus with diabetic chronic kidney disease: Secondary | ICD-10-CM | POA: Diagnosis not present

## 2018-11-02 DIAGNOSIS — N189 Chronic kidney disease, unspecified: Secondary | ICD-10-CM | POA: Diagnosis not present

## 2018-11-02 DIAGNOSIS — Z794 Long term (current) use of insulin: Secondary | ICD-10-CM | POA: Diagnosis not present

## 2018-11-02 DIAGNOSIS — Z5112 Encounter for antineoplastic immunotherapy: Secondary | ICD-10-CM | POA: Diagnosis not present

## 2018-11-02 DIAGNOSIS — M545 Low back pain: Secondary | ICD-10-CM | POA: Diagnosis not present

## 2018-11-02 DIAGNOSIS — Z992 Dependence on renal dialysis: Secondary | ICD-10-CM | POA: Diagnosis not present

## 2018-11-02 LAB — CMP (CANCER CENTER ONLY)
ALT: 7 U/L (ref 0–44)
AST: 11 U/L — ABNORMAL LOW (ref 15–41)
Albumin: 3.9 g/dL (ref 3.5–5.0)
Alkaline Phosphatase: 83 U/L (ref 38–126)
Anion gap: 11 (ref 5–15)
BUN: 23 mg/dL — ABNORMAL HIGH (ref 6–20)
CO2: 32 mmol/L (ref 22–32)
Calcium: 9.5 mg/dL (ref 8.9–10.3)
Chloride: 96 mmol/L — ABNORMAL LOW (ref 98–111)
Creatinine: 7.38 mg/dL (ref 0.61–1.24)
GFR, Est AFR Am: 9 mL/min — ABNORMAL LOW (ref >60)
GFR, Estimated: 8 mL/min — ABNORMAL LOW (ref >60)
Glucose, Bld: 197 mg/dL — ABNORMAL HIGH (ref 70–99)
Potassium: 4.1 mmol/L (ref 3.5–5.1)
Sodium: 139 mmol/L (ref 135–145)
Total Bilirubin: 0.7 mg/dL (ref 0.3–1.2)
Total Protein: 7.6 g/dL (ref 6.5–8.1)

## 2018-11-02 LAB — CBC WITH DIFFERENTIAL (CANCER CENTER ONLY)
Abs Immature Granulocytes: 0.03 10*3/uL (ref 0.00–0.07)
Basophils Absolute: 0.1 10*3/uL (ref 0.0–0.1)
Basophils Relative: 1 %
Eosinophils Absolute: 0.1 10*3/uL (ref 0.0–0.5)
Eosinophils Relative: 1 %
HCT: 24.8 % — ABNORMAL LOW (ref 39.0–52.0)
Hemoglobin: 9 g/dL — ABNORMAL LOW (ref 13.0–17.0)
Immature Granulocytes: 0 %
Lymphocytes Relative: 0 %
Lymphs Abs: 0 10*3/uL — ABNORMAL LOW (ref 0.7–4.0)
MCH: 30.6 pg (ref 26.0–34.0)
MCHC: 36.3 g/dL — ABNORMAL HIGH (ref 30.0–36.0)
MCV: 84.4 fL (ref 80.0–100.0)
Monocytes Absolute: 0.2 10*3/uL (ref 0.1–1.0)
Monocytes Relative: 3 %
Neutro Abs: 7.8 10*3/uL — ABNORMAL HIGH (ref 1.7–7.7)
Neutrophils Relative %: 95 %
Platelet Count: 272 10*3/uL (ref 150–400)
RBC: 2.94 MIL/uL — ABNORMAL LOW (ref 4.22–5.81)
RDW: 18.4 % — ABNORMAL HIGH (ref 11.5–15.5)
WBC Count: 8.2 10*3/uL (ref 4.0–10.5)
nRBC: 9.2 % — ABNORMAL HIGH (ref 0.0–0.2)

## 2018-11-02 MED ORDER — BORTEZOMIB CHEMO SQ INJECTION 3.5 MG (2.5MG/ML)
1.5000 mg/m2 | Freq: Once | INTRAMUSCULAR | Status: AC
  Administered 2018-11-02: 3.25 mg via SUBCUTANEOUS
  Filled 2018-11-02: qty 1.3

## 2018-11-02 MED ORDER — PROCHLORPERAZINE MALEATE 10 MG PO TABS: 10.0000 mg | ORAL_TABLET | Freq: Once | ORAL | Status: DC

## 2018-11-02 MED FILL — CYCLOPHOSPHAMIDE 50 MG CAP: 50 | 28 days supply | Qty: 52 | Fill #2

## 2018-11-02 NOTE — Patient Instructions (Signed)
[facility] Discharge Instructions for Patients Receiving Chemotherapy  Today you received the following chemotherapy agents Velcade  To help prevent nausea and vomiting after your treatment, we encourage you to take your nausea medication as directed.   If you develop nausea and vomiting that is not controlled by your nausea medication, call the clinic.   BELOW ARE SYMPTOMS THAT SHOULD BE REPORTED IMMEDIATELY:  *FEVER GREATER THAN 100.5 F  *CHILLS WITH OR WITHOUT FEVER  NAUSEA AND VOMITING THAT IS NOT CONTROLLED WITH YOUR NAUSEA MEDICATION  *UNUSUAL SHORTNESS OF BREATH  *UNUSUAL BRUISING OR BLEEDING  TENDERNESS IN MOUTH AND THROAT WITH OR WITHOUT PRESENCE OF ULCERS  *URINARY PROBLEMS  *BOWEL PROBLEMS  UNUSUAL RASH Items with * indicate a potential emergency and should be followed up as soon as possible.  Feel free to call the clinic should you have any questions or concerns. The clinic phone number is [phone].  Please show the [place] [facility] at check-in to the Emergency Department and triage nurse.

## 2018-11-03 DIAGNOSIS — N2581 Secondary hyperparathyroidism of renal origin: Secondary | ICD-10-CM | POA: Diagnosis not present

## 2018-11-03 DIAGNOSIS — D509 Iron deficiency anemia, unspecified: Secondary | ICD-10-CM | POA: Diagnosis not present

## 2018-11-03 DIAGNOSIS — D631 Anemia in chronic kidney disease: Secondary | ICD-10-CM | POA: Diagnosis not present

## 2018-11-03 DIAGNOSIS — N186 End stage renal disease: Secondary | ICD-10-CM | POA: Diagnosis not present

## 2018-11-03 DIAGNOSIS — E1129 Type 2 diabetes mellitus with other diabetic kidney complication: Secondary | ICD-10-CM | POA: Diagnosis not present

## 2018-11-03 LAB — KAPPA/LAMBDA LIGHT CHAINS
Kappa free light chain: 238.5 mg/L — ABNORMAL HIGH (ref 3.3–19.4)
Kappa, lambda light chain ratio: 3.39 — ABNORMAL HIGH (ref 0.26–1.65)
Lambda free light chains: 70.4 mg/L — ABNORMAL HIGH (ref 5.7–26.3)

## 2018-11-05 DIAGNOSIS — N2581 Secondary hyperparathyroidism of renal origin: Secondary | ICD-10-CM | POA: Diagnosis not present

## 2018-11-05 DIAGNOSIS — D509 Iron deficiency anemia, unspecified: Secondary | ICD-10-CM | POA: Diagnosis not present

## 2018-11-05 DIAGNOSIS — D631 Anemia in chronic kidney disease: Secondary | ICD-10-CM | POA: Diagnosis not present

## 2018-11-05 DIAGNOSIS — N186 End stage renal disease: Secondary | ICD-10-CM | POA: Diagnosis not present

## 2018-11-05 DIAGNOSIS — E1129 Type 2 diabetes mellitus with other diabetic kidney complication: Secondary | ICD-10-CM | POA: Diagnosis not present

## 2018-11-05 LAB — MULTIPLE MYELOMA PANEL, SERUM
Albumin SerPl Elph-Mcnc: 3.9 g/dL (ref 2.9–4.4)
Albumin/Glob SerPl: 1.2 (ref 0.7–1.7)
Alpha 1: 0.4 g/dL (ref 0.0–0.4)
Alpha2 Glob SerPl Elph-Mcnc: 0.7 g/dL (ref 0.4–1.0)
B-Globulin SerPl Elph-Mcnc: 0.8 g/dL (ref 0.7–1.3)
Gamma Glob SerPl Elph-Mcnc: 1.4 g/dL (ref 0.4–1.8)
Globulin, Total: 3.3 g/dL (ref 2.2–3.9)
IgA: 38 mg/dL — ABNORMAL LOW (ref 90–386)
IgG (Immunoglobin G), Serum: 1431 mg/dL (ref 700–1600)
IgM (Immunoglobulin M), Srm: 15 mg/dL — ABNORMAL LOW (ref 20–172)
M Protein SerPl Elph-Mcnc: 0.6 g/dL — ABNORMAL HIGH
Total Protein ELP: 7.2 g/dL (ref 6.0–8.5)

## 2018-11-07 DIAGNOSIS — E1122 Type 2 diabetes mellitus with diabetic chronic kidney disease: Secondary | ICD-10-CM | POA: Diagnosis not present

## 2018-11-07 DIAGNOSIS — Z992 Dependence on renal dialysis: Secondary | ICD-10-CM | POA: Diagnosis not present

## 2018-11-07 DIAGNOSIS — N186 End stage renal disease: Secondary | ICD-10-CM | POA: Diagnosis not present

## 2018-11-08 ENCOUNTER — Other Ambulatory Visit: Payer: Self-pay | Admitting: Neurology

## 2018-11-08 DIAGNOSIS — D631 Anemia in chronic kidney disease: Secondary | ICD-10-CM | POA: Diagnosis not present

## 2018-11-08 DIAGNOSIS — E1129 Type 2 diabetes mellitus with other diabetic kidney complication: Secondary | ICD-10-CM | POA: Diagnosis not present

## 2018-11-08 DIAGNOSIS — N2581 Secondary hyperparathyroidism of renal origin: Secondary | ICD-10-CM | POA: Diagnosis not present

## 2018-11-08 DIAGNOSIS — N186 End stage renal disease: Secondary | ICD-10-CM | POA: Diagnosis not present

## 2018-11-08 DIAGNOSIS — D509 Iron deficiency anemia, unspecified: Secondary | ICD-10-CM | POA: Diagnosis not present

## 2018-11-08 MED FILL — VIMPAT 150 MG TABLET: 150 | 30 days supply | Qty: 75 | Fill #0

## 2018-11-08 MED FILL — SUBVENITE 150 MG TABS: 150 | 90 days supply | Qty: 180 | Fill #0

## 2018-11-08 NOTE — Telephone Encounter (Signed)
Forwarded to Dr. Delice Lesch for approval

## 2018-11-09 ENCOUNTER — Inpatient Hospital Stay: Payer: Medicare Other

## 2018-11-09 ENCOUNTER — Telehealth: Payer: Self-pay | Admitting: Oncology

## 2018-11-09 ENCOUNTER — Inpatient Hospital Stay (HOSPITAL_BASED_OUTPATIENT_CLINIC_OR_DEPARTMENT_OTHER): Payer: Medicare Other | Admitting: Oncology

## 2018-11-09 ENCOUNTER — Inpatient Hospital Stay: Payer: Medicare Other | Attending: Oncology

## 2018-11-09 VITALS — BP 139/72 | HR 79 | Temp 98.6°F | Resp 18 | Ht 64.0 in | Wt 214.7 lb

## 2018-11-09 DIAGNOSIS — I129 Hypertensive chronic kidney disease with stage 1 through stage 4 chronic kidney disease, or unspecified chronic kidney disease: Secondary | ICD-10-CM | POA: Diagnosis not present

## 2018-11-09 DIAGNOSIS — C9000 Multiple myeloma not having achieved remission: Secondary | ICD-10-CM

## 2018-11-09 DIAGNOSIS — Z5112 Encounter for antineoplastic immunotherapy: Secondary | ICD-10-CM | POA: Diagnosis not present

## 2018-11-09 DIAGNOSIS — E1122 Type 2 diabetes mellitus with diabetic chronic kidney disease: Secondary | ICD-10-CM | POA: Diagnosis not present

## 2018-11-09 DIAGNOSIS — D649 Anemia, unspecified: Secondary | ICD-10-CM | POA: Diagnosis not present

## 2018-11-09 DIAGNOSIS — N189 Chronic kidney disease, unspecified: Secondary | ICD-10-CM | POA: Diagnosis not present

## 2018-11-09 LAB — CBC WITH DIFFERENTIAL (CANCER CENTER ONLY)
Abs Immature Granulocytes: 0 10*3/uL (ref 0.00–0.07)
Basophils Absolute: 0.1 10*3/uL (ref 0.0–0.1)
Basophils Relative: 1 %
Eosinophils Absolute: 0.2 10*3/uL (ref 0.0–0.5)
Eosinophils Relative: 2 %
HCT: 25.5 % — ABNORMAL LOW (ref 39.0–52.0)
Hemoglobin: 9.2 g/dL — ABNORMAL LOW (ref 13.0–17.0)
Lymphocytes Relative: 2 %
Lymphs Abs: 0.2 10*3/uL — ABNORMAL LOW (ref 0.7–4.0)
MCH: 30.8 pg (ref 26.0–34.0)
MCHC: 36.1 g/dL — ABNORMAL HIGH (ref 30.0–36.0)
MCV: 85.3 fL (ref 80.0–100.0)
Monocytes Absolute: 0.6 10*3/uL (ref 0.1–1.0)
Monocytes Relative: 6 %
Neutro Abs: 8.5 10*3/uL (ref 1.7–17.7)
Neutrophils Relative %: 89 %
Platelet Count: 252 10*3/uL (ref 150–400)
RBC: 2.99 MIL/uL — ABNORMAL LOW (ref 4.22–5.81)
RDW: 18.6 % — ABNORMAL HIGH (ref 11.5–15.5)
WBC Count: 9.6 10*3/uL (ref 4.0–10.5)
nRBC: 17 % — ABNORMAL HIGH (ref 0.0–0.2)

## 2018-11-09 LAB — CMP (CANCER CENTER ONLY)
ALT: 6 U/L (ref 0–44)
AST: 13 U/L — ABNORMAL LOW (ref 15–41)
Albumin: 3.6 g/dL (ref 3.5–5.0)
Alkaline Phosphatase: 77 U/L (ref 38–126)
Anion gap: 12 (ref 5–15)
BUN: 21 mg/dL — ABNORMAL HIGH (ref 6–20)
CO2: 32 mmol/L (ref 22–32)
Calcium: 8.6 mg/dL — ABNORMAL LOW (ref 8.9–10.3)
Chloride: 95 mmol/L — ABNORMAL LOW (ref 98–111)
Creatinine: 7.27 mg/dL (ref 0.61–1.24)
GFR, Est AFR Am: 9 mL/min — ABNORMAL LOW (ref >60)
GFR, Estimated: 8 mL/min — ABNORMAL LOW (ref >60)
Glucose, Bld: 226 mg/dL — ABNORMAL HIGH (ref 70–99)
Potassium: 3.8 mmol/L (ref 3.5–5.1)
Sodium: 139 mmol/L (ref 135–145)
Total Bilirubin: 0.8 mg/dL (ref 0.3–1.2)
Total Protein: 7.1 g/dL (ref 6.5–8.1)

## 2018-11-09 MED ORDER — PROCHLORPERAZINE MALEATE 10 MG PO TABS: 10.0000 mg | ORAL_TABLET | Freq: Once | ORAL | Status: DC

## 2018-11-09 MED ORDER — BORTEZOMIB CHEMO SQ INJECTION 3.5 MG (2.5MG/ML)
1.5000 mg/m2 | Freq: Once | INTRAMUSCULAR | Status: AC
  Administered 2018-11-09: 3.25 mg via SUBCUTANEOUS
  Filled 2018-11-09: qty 1.3

## 2018-11-09 MED FILL — clonazePAM 0.5 MG TABS: 0.5 | 15 days supply | Qty: 15 | Fill #0

## 2018-11-09 NOTE — Progress Notes (Signed)
Hematology and Oncology Follow Up Visit  [place] [id number] 12-13-1965 53 y.o. 11/09/2018 10:17 AM    Principle Diagnosis: 53 year old man with  1.  Multiple myeloma documented in September 2019 after presenting with IgG kappa plasma cell disorder previously.  He has 20% plasma infiltration in the bone marrow at the time of diagnosis.  2.  Chronic renal failure related to longstanding hypertension without any evidence of plasma cell disorder.  Current therapy: Velcade dexamethasone and cyclophosphamide weekly started on 05/25/2018.  Velcade is given at 1.5 mg/m weekly, dexamethasone is 20 mg weekly and and Cytoxan is given at 650 mg total dose.  He is here for a follow-up before his next cycle of therapy.  Interim History:  Isaiah [name] presents today for a repeat evaluation.  Since last visit, he continues to tolerate chemotherapy without any major complaints.  He denies any nausea, fatigue or any injection related complications.  He denies any worsening neuropathy or excessive tiredness.  He does use Compazine periodically for mild nausea.  Continues to receive hemodialysis Monday Wednesday Friday without any issues or complaints.  Denies any recent hospitalization or illnesses.  He denies any recurrent infections or bleeding.   Patient denied headaches, blurry vision, syncope or seizures.  Denies any fevers, chills or sweats.  Denied chest pain, palpitation, orthopnea or leg edema.  Denied cough, wheezing or hemoptysis.  Denied nausea, vomiting or abdominal pain.  Denies any constipation or diarrhea.  Denies any frequency urgency or hesitancy.  Denies any arthralgias or myalgias.  Denies any skin rashes or lesions.  Denies any bleeding or clotting tendency.  Denies any easy bruising.  Denies any hair or nail changes.  Denies any anxiety or depression.  Remaining review of system is negative.      Medications: I have reviewed the patient's current medications.  Current Outpatient  Medications:  .  acyclovir (ZOVIRAX) 400 MG tablet, TAKE 1 TABLET (400 MG TOTAL) BY MOUTH DAILY., Disp: 90 tablet, Rfl: 0 .  albuterol (PROAIR HFA) 108 (90 Base) MCG/ACT inhaler, Inhale two puffs every 4-6 hours if needed for cough or wheeze, Disp: 1 Inhaler, Rfl: 1 .  aspirin EC 81 MG tablet, Take 1 tablet (81 mg total) by mouth daily., Disp: 100 tablet, Rfl: 0 .  calcium acetate (PHOSLO) 667 MG capsule, Take 2,001 mg by mouth 3 (three) times daily with meals. , Disp: , Rfl:  .  Cetirizine HCl 10 MG CAPS, Take 1 capsule by mouth daily., Disp: , Rfl:  .  cinacalcet (SENSIPAR) 60 MG tablet, Take 60 mg by mouth every other day. , Disp: , Rfl:  .  clonazePAM (KLONOPIN) 0.5 MG tablet, TAKE 1 TABLET BY MOUTH PRIOR TO EACH DIALYSIS SESSION, Disp: 15 tablet, Rfl: 2 .  cyclophosphamide (CYTOXAN) 50 MG capsule, TAKE 13 CAPSULES (650MG) BY MOUTH ONCE WEEKLY, TAKE EARLY IN THE DAY, Disp: 52 capsule, Rfl: 3 .  dexamethasone (DECADRON) 4 MG tablet, Take 5 tablets weekly, Disp: 90 tablet, Rfl: 3 .  fluticasone furoate-vilanterol (BREO ELLIPTA) 100-25 MCG/INH AEPB, Inhale one puff once daily to prevent cough or wheeze, Disp: 60 each, Rfl: 5 .  insulin lispro (HUMALOG) 100 UNIT/ML injection, Inject into the skin 3 (three) times daily before meals. 3 to 4 units TID prn SS, Disp: , Rfl:  .  Lacosamide (VIMPAT) 150 MG TABS, TAKE 1 TABLET BY MOUTH TWICE DAILY TAKE AN ADDITIONAL 1 TABLET AFTER DIALYSIS SESSION THREE TIMES A WEEK., Disp: 75 tablet, Rfl: 2 .  lamoTRIgine (  LAMICTAL) 150 MG tablet, Take 1 tablet (150 mg total) by mouth 2 (two) times daily., Disp: 180 tablet, Rfl: 3 .  metoprolol tartrate (LOPRESSOR) 25 MG tablet, Take 0.5 tablets (12.5 mg total) by mouth 2 (two) times daily., Disp: 60 tablet, Rfl: 0 .  midodrine (PROAMATINE) 10 MG tablet, Take 10 mg by mouth 2 (two) times daily with a meal. , Disp: , Rfl:  .  multivitamin (RENA-VIT) TABS tablet, Take 1 tablet by mouth at bedtime., Disp: 30 tablet, Rfl: 1 .   pantoprazole (PROTONIX) 40 MG tablet, Take 1 tablet (40 mg total) by mouth 2 (two) times daily. (Patient taking differently: Take 40 mg by mouth every morning. ), Disp: 60 tablet, Rfl: 1 .  prochlorperazine (COMPAZINE) 10 MG tablet, TAKE 1 TABLET (10 MG TOTAL) BY MOUTH EVERY 6 (SIX) HOURS AS NEEDED FOR NAUSEA OR VOMITING., Disp: 30 tablet, Rfl: 0  Allergies:  Allergies  Allergen Reactions  . Codeine Rash    Unknown reaction (patient says it was more serious than just a rash, but he can't remember what happened)    Past Medical History, Surgical history, Social history, and Family History were reviewed and updated.  Physical Exam:  Blood pressure 139/72, pulse 79, temperature 98.6 F (37 C), temperature source Oral, resp. rate 18, height 5' 4"  (1.626 m), weight 214 lb 11.2 oz (97.4 kg), SpO2 94 %.      ECOG: 1    General appearance: Comfortable appearing without any discomfort Head: Normocephalic without any trauma Oropharynx: Mucous membranes are moist and pink without any thrush or ulcers. Eyes: Pupils are equal and round reactive to light. Lymph nodes: No cervical, supraclavicular, inguinal or axillary lymphadenopathy.   Heart:regular rate and rhythm.  S1 and S2 without leg edema. Lung: Clear without any rhonchi or wheezes.  No dullness to percussion. Abdomin: Soft, nontender, nondistended with good bowel sounds.  No hepatosplenomegaly. Musculoskeletal: No joint deformity or effusion.  Full range of motion noted. Neurological: No deficits noted on motor, sensory and deep tendon reflex exam. Skin: No petechial rash or dryness.  Appeared moist.          Lab Results: Lab Results  Component Value Date   WBC 8.2 11/02/2018   HGB 9.0 (L) 11/02/2018   HCT 24.8 (L) 11/02/2018   MCV 84.4 11/02/2018   PLT 272 11/02/2018     Chemistry      Component Value Date/Time   NA 139 11/02/2018 1103   NA 138 07/03/2017 0813   K 4.1 11/02/2018 1103   K 4.2 07/03/2017 0813   CL  96 (L) 11/02/2018 1103   CO2 32 11/02/2018 1103   CO2 27 07/03/2017 0813   BUN 23 (H) 11/02/2018 1103   BUN 46.4 (H) 07/03/2017 0813   CREATININE 7.38 (HH) 11/02/2018 1103   CREATININE 10.9 (HH) 07/03/2017 0813      Component Value Date/Time   CALCIUM 9.5 11/02/2018 1103   CALCIUM 9.3 07/03/2017 0813   ALKPHOS 83 11/02/2018 1103   ALKPHOS 77 07/03/2017 0813   AST 11 (L) 11/02/2018 1103   AST 16 07/03/2017 0813   ALT 7 11/02/2018 1103   ALT 14 07/03/2017 0813   BILITOT 0.7 11/02/2018 1103   BILITOT 0.93 07/03/2017 0813       Results for Isaiah [name], Isaiah [name] (MRN [id number]) as of 11/09/2018 10:20  Ref. Range 10/05/2018 13:10 11/02/2018 11:03  M Protein SerPl Elph-Mcnc Latest Ref Range: Not Observed g/dL 0.9 (H) 0.6 (H)   Results for  Isaiah [name], Isaiah [name] (MRN [id number]) as of 11/09/2018 10:20  Ref. Range 10/05/2018 13:10 11/02/2018 11:03  IgG (Immunoglobin G), Serum Latest Ref Range: 700 - 1,600 mg/dL 1,494 1,431      Impression and Plan:  53 year old man with  1.  Multiple myeloma diagnosed in 2019 after presenting with IgG kappa MGUS diagnosed in 2012.    He remains on Velcade, dexamethasone and cyclophosphamide with reasonable tolerance and no major complications.  Protein studies obtained on 11/02/2018 were personally reviewed and discussed with the patient and his wife over the phone.  His M spike continues to decline currently at 0.6 g/dL and he is approaching achieving complete response.  His IgG level has normalized at this time at 1431.  Continues to have elevated both kappa and lambda free light chain with slightly increased ratio of 3.39.  Risks and benefits of continuing this therapy was discussed today.  And I recommended completing an additional 4 weeks of therapy to get close to 6 months which will get him hopefully closer to achieving complete response.  He [facility] at a later date.     2. Anemia: Hemoglobin slightly declined but does  not require any transfusion.  3. Renal failure: He is currently on hemodialysis and under consideration for renal transplant if he achieves complete response from his myeloma therapy.  4.  Zoster prophylaxis: He remains on acyclovir without any reactivation.  5.  Diabetes: Currently on dexamethasone without any recent exacerbation related to it.  6.  Nausea prophylaxis: Compazine has been effective at this time.  Continue to instruct him to use it.  7.  Neck pain: Resolved at this time.  Skeletal survey did not show any lesions.  8.  Goals of care: Therapy is warranted given his reasonable performance status.  He would be a good candidate for renal transplant after achieving complete response.  9. Follow-up: 4 weekly Velcade therapy and MD follow-up in 4 weeks.  25  minutes was spent with the patient face-to-face today.  More than 50% of time was dedicated to discussing laboratory data, updating his disease status and answering question regarding future plan of care.     Zola Button, MD 3/3/202010:17 AM

## 2018-11-09 NOTE — Progress Notes (Signed)
Pt refused to stay 30 minutes post IVIG infusion.  VSS, pt asymptomatic, and pt discharged home with spouse.

## 2018-11-09 NOTE — Telephone Encounter (Signed)
Per patient, did not schedule appts. Wife will call to schedule.

## 2018-11-09 NOTE — Patient Instructions (Signed)
[facility] Discharge Instructions for Patients Receiving Chemotherapy  Today you received the following chemotherapy agents Velcade  To help prevent nausea and vomiting after your treatment, we encourage you to take your nausea medication as directed   If you develop nausea and vomiting that is not controlled by your nausea medication, call the clinic.   BELOW ARE SYMPTOMS THAT SHOULD BE REPORTED IMMEDIATELY:  *FEVER GREATER THAN 100.5 F  *CHILLS WITH OR WITHOUT FEVER  NAUSEA AND VOMITING THAT IS NOT CONTROLLED WITH YOUR NAUSEA MEDICATION  *UNUSUAL SHORTNESS OF BREATH  *UNUSUAL BRUISING OR BLEEDING  TENDERNESS IN MOUTH AND THROAT WITH OR WITHOUT PRESENCE OF ULCERS  *URINARY PROBLEMS  *BOWEL PROBLEMS  UNUSUAL RASH Items with * indicate a potential emergency and should be followed up as soon as possible.  Feel free to call the clinic should you have any questions or concerns. The clinic phone number is [phone].  Please show the [facility] at check-in to the Emergency Department and triage nurse.

## 2018-11-10 DIAGNOSIS — N186 End stage renal disease: Secondary | ICD-10-CM | POA: Diagnosis not present

## 2018-11-10 DIAGNOSIS — N2581 Secondary hyperparathyroidism of renal origin: Secondary | ICD-10-CM | POA: Diagnosis not present

## 2018-11-10 DIAGNOSIS — D631 Anemia in chronic kidney disease: Secondary | ICD-10-CM | POA: Diagnosis not present

## 2018-11-10 DIAGNOSIS — E1129 Type 2 diabetes mellitus with other diabetic kidney complication: Secondary | ICD-10-CM | POA: Diagnosis not present

## 2018-11-10 DIAGNOSIS — D509 Iron deficiency anemia, unspecified: Secondary | ICD-10-CM | POA: Diagnosis not present

## 2018-11-12 DIAGNOSIS — N186 End stage renal disease: Secondary | ICD-10-CM | POA: Diagnosis not present

## 2018-11-12 DIAGNOSIS — N2581 Secondary hyperparathyroidism of renal origin: Secondary | ICD-10-CM | POA: Diagnosis not present

## 2018-11-12 DIAGNOSIS — D631 Anemia in chronic kidney disease: Secondary | ICD-10-CM | POA: Diagnosis not present

## 2018-11-12 DIAGNOSIS — D509 Iron deficiency anemia, unspecified: Secondary | ICD-10-CM | POA: Diagnosis not present

## 2018-11-12 DIAGNOSIS — E1129 Type 2 diabetes mellitus with other diabetic kidney complication: Secondary | ICD-10-CM | POA: Diagnosis not present

## 2018-11-15 ENCOUNTER — Telehealth: Payer: Self-pay | Admitting: Oncology

## 2018-11-15 ENCOUNTER — Other Ambulatory Visit: Payer: Self-pay | Admitting: Oncology

## 2018-11-15 DIAGNOSIS — D509 Iron deficiency anemia, unspecified: Secondary | ICD-10-CM | POA: Diagnosis not present

## 2018-11-15 DIAGNOSIS — N2581 Secondary hyperparathyroidism of renal origin: Secondary | ICD-10-CM | POA: Diagnosis not present

## 2018-11-15 DIAGNOSIS — D631 Anemia in chronic kidney disease: Secondary | ICD-10-CM | POA: Diagnosis not present

## 2018-11-15 DIAGNOSIS — N186 End stage renal disease: Secondary | ICD-10-CM | POA: Diagnosis not present

## 2018-11-15 DIAGNOSIS — E1129 Type 2 diabetes mellitus with other diabetic kidney complication: Secondary | ICD-10-CM | POA: Diagnosis not present

## 2018-11-15 MED FILL — PROCHLORPERAZINE 10 MG TAB: 10 | 7 days supply | Qty: 30 | Fill #0

## 2018-11-15 MED FILL — METOPROLOL TARTRATE 25 MG T: 25 | 90 days supply | Qty: 90 | Fill #1

## 2018-11-15 NOTE — Telephone Encounter (Signed)
Called regarding 4/7

## 2018-11-16 ENCOUNTER — Inpatient Hospital Stay: Payer: Medicare Other

## 2018-11-16 ENCOUNTER — Other Ambulatory Visit: Payer: Self-pay

## 2018-11-16 VITALS — BP 122/47 | HR 84 | Temp 98.9°F | Resp 17 | Ht 64.0 in | Wt 211.5 lb

## 2018-11-16 DIAGNOSIS — Z5112 Encounter for antineoplastic immunotherapy: Secondary | ICD-10-CM | POA: Diagnosis not present

## 2018-11-16 DIAGNOSIS — C9000 Multiple myeloma not having achieved remission: Secondary | ICD-10-CM

## 2018-11-16 LAB — CBC WITH DIFFERENTIAL (CANCER CENTER ONLY)
Abs Immature Granulocytes: 0.02 10*3/uL (ref 0.00–0.07)
Basophils Absolute: 0.1 10*3/uL (ref 0.0–0.1)
Basophils Relative: 1 %
Eosinophils Absolute: 0 10*3/uL (ref 0.0–0.5)
Eosinophils Relative: 0 %
HCT: 26.7 % — ABNORMAL LOW (ref 39.0–52.0)
Hemoglobin: 9.7 g/dL — ABNORMAL LOW (ref 13.0–17.0)
Immature Granulocytes: 0 %
Lymphocytes Relative: 0 %
Lymphs Abs: 0 10*3/uL — ABNORMAL LOW (ref 0.7–4.0)
MCH: 30.9 pg (ref 26.0–34.0)
MCHC: 36.3 g/dL — ABNORMAL HIGH (ref 30.0–36.0)
MCV: 85 fL (ref 80.0–100.0)
Monocytes Absolute: 0.2 10*3/uL (ref 0.1–1.0)
Monocytes Relative: 2 %
Neutro Abs: 8.1 10*3/uL — ABNORMAL HIGH (ref 1.7–7.7)
Neutrophils Relative %: 97 %
Platelet Count: 260 10*3/uL (ref 150–400)
RBC: 3.14 MIL/uL — ABNORMAL LOW (ref 4.22–5.81)
RDW: 18.8 % — ABNORMAL HIGH (ref 11.5–15.5)
WBC Count: 8.4 10*3/uL (ref 4.0–10.5)
nRBC: 6.9 % — ABNORMAL HIGH (ref 0.0–0.2)

## 2018-11-16 LAB — CMP (CANCER CENTER ONLY)
ALT: 6 U/L (ref 0–44)
AST: 10 U/L — ABNORMAL LOW (ref 15–41)
Albumin: 3.7 g/dL (ref 3.5–5.0)
Alkaline Phosphatase: 73 U/L (ref 38–126)
Anion gap: 14 (ref 5–15)
BUN: 21 mg/dL — ABNORMAL HIGH (ref 6–20)
CO2: 32 mmol/L (ref 22–32)
Calcium: 9.2 mg/dL (ref 8.9–10.3)
Chloride: 94 mmol/L — ABNORMAL LOW (ref 98–111)
Creatinine: 7.33 mg/dL (ref 0.61–1.24)
GFR, Est AFR Am: 9 mL/min — ABNORMAL LOW (ref >60)
GFR, Estimated: 8 mL/min — ABNORMAL LOW (ref >60)
Glucose, Bld: 172 mg/dL — ABNORMAL HIGH (ref 70–99)
Potassium: 4.2 mmol/L (ref 3.5–5.1)
Sodium: 140 mmol/L (ref 135–145)
Total Bilirubin: 0.9 mg/dL (ref 0.3–1.2)
Total Protein: 7.5 g/dL (ref 6.5–8.1)

## 2018-11-16 MED ORDER — PROCHLORPERAZINE MALEATE 10 MG PO TABS: 10.0000 mg | ORAL_TABLET | Freq: Once | ORAL | Status: DC

## 2018-11-16 MED ORDER — BORTEZOMIB CHEMO SQ INJECTION 3.5 MG (2.5MG/ML)
1.5000 mg/m2 | Freq: Once | INTRAMUSCULAR | Status: AC
  Administered 2018-11-16: 3.25 mg via SUBCUTANEOUS
  Filled 2018-11-16: qty 1.3

## 2018-11-16 NOTE — Patient Instructions (Signed)
[facility] Discharge Instructions for Patients Receiving Chemotherapy  Today you received the following chemotherapy agents Velcade  To help prevent nausea and vomiting after your treatment, we encourage you to take your nausea medication as directed   If you develop nausea and vomiting that is not controlled by your nausea medication, call the clinic.   BELOW ARE SYMPTOMS THAT SHOULD BE REPORTED IMMEDIATELY:  *FEVER GREATER THAN 100.5 F  *CHILLS WITH OR WITHOUT FEVER  NAUSEA AND VOMITING THAT IS NOT CONTROLLED WITH YOUR NAUSEA MEDICATION  *UNUSUAL SHORTNESS OF BREATH  *UNUSUAL BRUISING OR BLEEDING  TENDERNESS IN MOUTH AND THROAT WITH OR WITHOUT PRESENCE OF ULCERS  *URINARY PROBLEMS  *BOWEL PROBLEMS  UNUSUAL RASH Items with * indicate a potential emergency and should be followed up as soon as possible.  Feel free to call the clinic should you have any questions or concerns. The clinic phone number is [phone].  Please show the [facility] at check-in to the Emergency Department and triage nurse.

## 2018-11-17 DIAGNOSIS — D509 Iron deficiency anemia, unspecified: Secondary | ICD-10-CM | POA: Diagnosis not present

## 2018-11-17 DIAGNOSIS — D631 Anemia in chronic kidney disease: Secondary | ICD-10-CM | POA: Diagnosis not present

## 2018-11-17 DIAGNOSIS — N186 End stage renal disease: Secondary | ICD-10-CM | POA: Diagnosis not present

## 2018-11-17 DIAGNOSIS — N2581 Secondary hyperparathyroidism of renal origin: Secondary | ICD-10-CM | POA: Diagnosis not present

## 2018-11-17 DIAGNOSIS — E1129 Type 2 diabetes mellitus with other diabetic kidney complication: Secondary | ICD-10-CM | POA: Diagnosis not present

## 2018-11-17 NOTE — Progress Notes (Signed)
FMLA for spouse, [facility], successfully faxed to [employer] at [phone]. Mailed copy to patient address on file.

## 2018-11-19 DIAGNOSIS — D631 Anemia in chronic kidney disease: Secondary | ICD-10-CM | POA: Diagnosis not present

## 2018-11-19 DIAGNOSIS — N186 End stage renal disease: Secondary | ICD-10-CM | POA: Diagnosis not present

## 2018-11-19 DIAGNOSIS — E1129 Type 2 diabetes mellitus with other diabetic kidney complication: Secondary | ICD-10-CM | POA: Diagnosis not present

## 2018-11-19 DIAGNOSIS — N2581 Secondary hyperparathyroidism of renal origin: Secondary | ICD-10-CM | POA: Diagnosis not present

## 2018-11-19 DIAGNOSIS — D509 Iron deficiency anemia, unspecified: Secondary | ICD-10-CM | POA: Diagnosis not present

## 2018-11-22 DIAGNOSIS — N186 End stage renal disease: Secondary | ICD-10-CM | POA: Diagnosis not present

## 2018-11-22 DIAGNOSIS — E1129 Type 2 diabetes mellitus with other diabetic kidney complication: Secondary | ICD-10-CM | POA: Diagnosis not present

## 2018-11-22 DIAGNOSIS — N2581 Secondary hyperparathyroidism of renal origin: Secondary | ICD-10-CM | POA: Diagnosis not present

## 2018-11-22 DIAGNOSIS — D509 Iron deficiency anemia, unspecified: Secondary | ICD-10-CM | POA: Diagnosis not present

## 2018-11-22 DIAGNOSIS — D631 Anemia in chronic kidney disease: Secondary | ICD-10-CM | POA: Diagnosis not present

## 2018-11-23 ENCOUNTER — Inpatient Hospital Stay: Payer: Medicare Other

## 2018-11-23 ENCOUNTER — Other Ambulatory Visit: Payer: Self-pay

## 2018-11-23 VITALS — BP 147/73 | HR 76 | Temp 98.5°F | Resp 18

## 2018-11-23 DIAGNOSIS — C9000 Multiple myeloma not having achieved remission: Secondary | ICD-10-CM | POA: Diagnosis not present

## 2018-11-23 DIAGNOSIS — Z5112 Encounter for antineoplastic immunotherapy: Secondary | ICD-10-CM | POA: Diagnosis not present

## 2018-11-23 LAB — CBC WITH DIFFERENTIAL (CANCER CENTER ONLY)
Abs Immature Granulocytes: 0.03 10*3/uL (ref 0.00–0.07)
Basophils Absolute: 0.1 10*3/uL (ref 0.0–0.1)
Basophils Relative: 1 %
Eosinophils Absolute: 0 10*3/uL (ref 0.0–0.5)
Eosinophils Relative: 1 %
HCT: 25.7 % — ABNORMAL LOW (ref 39.0–52.0)
Hemoglobin: 9.4 g/dL — ABNORMAL LOW (ref 13.0–17.0)
Immature Granulocytes: 1 %
Lymphocytes Relative: 0 %
Lymphs Abs: 0 10*3/uL — ABNORMAL LOW (ref 0.7–4.0)
MCH: 31.1 pg (ref 26.0–34.0)
MCHC: 36.6 g/dL — ABNORMAL HIGH (ref 30.0–36.0)
MCV: 85.1 fL (ref 80.0–100.0)
Monocytes Absolute: 0.2 10*3/uL (ref 0.1–1.0)
Monocytes Relative: 3 %
Neutro Abs: 5.9 10*3/uL (ref 1.7–7.7)
Neutrophils Relative %: 94 %
Platelet Count: 243 10*3/uL (ref 150–400)
RBC: 3.02 MIL/uL — ABNORMAL LOW (ref 4.22–5.81)
RDW: 18.6 % — ABNORMAL HIGH (ref 11.5–15.5)
WBC Count: 6.2 10*3/uL (ref 4.0–10.5)
nRBC: 7.8 % — ABNORMAL HIGH (ref 0.0–0.2)

## 2018-11-23 LAB — CMP (CANCER CENTER ONLY)
ALT: 8 U/L (ref 0–44)
AST: 13 U/L — ABNORMAL LOW (ref 15–41)
Albumin: 3.6 g/dL (ref 3.5–5.0)
Alkaline Phosphatase: 75 U/L (ref 38–126)
Anion gap: 15 (ref 5–15)
BUN: 29 mg/dL — ABNORMAL HIGH (ref 6–20)
CO2: 29 mmol/L (ref 22–32)
Calcium: 8.9 mg/dL (ref 8.9–10.3)
Chloride: 95 mmol/L — ABNORMAL LOW (ref 98–111)
Creatinine: 7.56 mg/dL (ref 0.61–1.24)
GFR, Est AFR Am: 8 mL/min — ABNORMAL LOW (ref >60)
GFR, Estimated: 7 mL/min — ABNORMAL LOW (ref >60)
Glucose, Bld: 197 mg/dL — ABNORMAL HIGH (ref 70–99)
Potassium: 4.4 mmol/L (ref 3.5–5.1)
Sodium: 139 mmol/L (ref 135–145)
Total Bilirubin: 0.8 mg/dL (ref 0.3–1.2)
Total Protein: 7.1 g/dL (ref 6.5–8.1)

## 2018-11-23 MED ORDER — BORTEZOMIB CHEMO SQ INJECTION 3.5 MG (2.5MG/ML)
1.5000 mg/m2 | Freq: Once | INTRAMUSCULAR | Status: AC
  Administered 2018-11-23: 3.25 mg via SUBCUTANEOUS
  Filled 2018-11-23: qty 1.3

## 2018-11-23 MED ORDER — PROCHLORPERAZINE MALEATE 10 MG PO TABS: 10.0000 mg | ORAL_TABLET | Freq: Once | ORAL | Status: DC

## 2018-11-23 NOTE — Patient Instructions (Signed)
[facility] Discharge Instructions for Patients Receiving Chemotherapy  Today you received the following chemotherapy agents Velcade  To help prevent nausea and vomiting after your treatment, we encourage you to take your nausea medication as directed   If you develop nausea and vomiting that is not controlled by your nausea medication, call the clinic.   BELOW ARE SYMPTOMS THAT SHOULD BE REPORTED IMMEDIATELY:  *FEVER GREATER THAN 100.5 F  *CHILLS WITH OR WITHOUT FEVER  NAUSEA AND VOMITING THAT IS NOT CONTROLLED WITH YOUR NAUSEA MEDICATION  *UNUSUAL SHORTNESS OF BREATH  *UNUSUAL BRUISING OR BLEEDING  TENDERNESS IN MOUTH AND THROAT WITH OR WITHOUT PRESENCE OF ULCERS  *URINARY PROBLEMS  *BOWEL PROBLEMS  UNUSUAL RASH Items with * indicate a potential emergency and should be followed up as soon as possible.  Feel free to call the clinic should you have any questions or concerns. The clinic phone number is [phone].  Please show the [facility] at check-in to the Emergency Department and triage nurse.

## 2018-11-24 DIAGNOSIS — N2581 Secondary hyperparathyroidism of renal origin: Secondary | ICD-10-CM | POA: Diagnosis not present

## 2018-11-24 DIAGNOSIS — E1129 Type 2 diabetes mellitus with other diabetic kidney complication: Secondary | ICD-10-CM | POA: Diagnosis not present

## 2018-11-24 DIAGNOSIS — N186 End stage renal disease: Secondary | ICD-10-CM | POA: Diagnosis not present

## 2018-11-24 DIAGNOSIS — D509 Iron deficiency anemia, unspecified: Secondary | ICD-10-CM | POA: Diagnosis not present

## 2018-11-24 DIAGNOSIS — D631 Anemia in chronic kidney disease: Secondary | ICD-10-CM | POA: Diagnosis not present

## 2018-11-24 MED FILL — SM ALCOHOL 70% PREP PADS: 70 | 66 days supply | Qty: 200 | Fill #0

## 2018-11-24 MED FILL — CYCLOPHOSPHAMIDE 50 MG CAP: 50 | 28 days supply | Qty: 52 | Fill #3

## 2018-11-24 MED FILL — DEXAMETHASONE 4 MG TABLET: 4 | 84 days supply | Qty: 60 | Fill #1

## 2018-11-25 ENCOUNTER — Encounter: Payer: Self-pay | Admitting: Internal Medicine

## 2018-11-26 DIAGNOSIS — E1129 Type 2 diabetes mellitus with other diabetic kidney complication: Secondary | ICD-10-CM | POA: Diagnosis not present

## 2018-11-26 DIAGNOSIS — N186 End stage renal disease: Secondary | ICD-10-CM | POA: Diagnosis not present

## 2018-11-26 DIAGNOSIS — D631 Anemia in chronic kidney disease: Secondary | ICD-10-CM | POA: Diagnosis not present

## 2018-11-26 DIAGNOSIS — N2581 Secondary hyperparathyroidism of renal origin: Secondary | ICD-10-CM | POA: Diagnosis not present

## 2018-11-26 DIAGNOSIS — D509 Iron deficiency anemia, unspecified: Secondary | ICD-10-CM | POA: Diagnosis not present

## 2018-11-29 DIAGNOSIS — E1129 Type 2 diabetes mellitus with other diabetic kidney complication: Secondary | ICD-10-CM | POA: Diagnosis not present

## 2018-11-29 DIAGNOSIS — D509 Iron deficiency anemia, unspecified: Secondary | ICD-10-CM | POA: Diagnosis not present

## 2018-11-29 DIAGNOSIS — D631 Anemia in chronic kidney disease: Secondary | ICD-10-CM | POA: Diagnosis not present

## 2018-11-29 DIAGNOSIS — N2581 Secondary hyperparathyroidism of renal origin: Secondary | ICD-10-CM | POA: Diagnosis not present

## 2018-11-29 DIAGNOSIS — N186 End stage renal disease: Secondary | ICD-10-CM | POA: Diagnosis not present

## 2018-11-30 ENCOUNTER — Inpatient Hospital Stay: Payer: Medicare Other

## 2018-11-30 ENCOUNTER — Other Ambulatory Visit: Payer: Self-pay

## 2018-11-30 VITALS — BP 155/79 | HR 77 | Temp 99.2°F | Resp 16

## 2018-11-30 DIAGNOSIS — Z5112 Encounter for antineoplastic immunotherapy: Secondary | ICD-10-CM | POA: Diagnosis not present

## 2018-11-30 DIAGNOSIS — C9000 Multiple myeloma not having achieved remission: Secondary | ICD-10-CM

## 2018-11-30 LAB — CMP (CANCER CENTER ONLY)
ALT: 14 U/L (ref 0–44)
AST: 19 U/L (ref 15–41)
Albumin: 3.6 g/dL (ref 3.5–5.0)
Alkaline Phosphatase: 75 U/L (ref 38–126)
Anion gap: 15 (ref 5–15)
BUN: 22 mg/dL — ABNORMAL HIGH (ref 6–20)
CO2: 30 mmol/L (ref 22–32)
Calcium: 8.9 mg/dL (ref 8.9–10.3)
Chloride: 96 mmol/L — ABNORMAL LOW (ref 98–111)
Creatinine: 6.94 mg/dL (ref 0.61–1.24)
GFR, Est AFR Am: 9 mL/min — ABNORMAL LOW (ref >60)
GFR, Estimated: 8 mL/min — ABNORMAL LOW (ref >60)
Glucose, Bld: 149 mg/dL — ABNORMAL HIGH (ref 70–99)
Potassium: 4.2 mmol/L (ref 3.5–5.1)
Sodium: 141 mmol/L (ref 135–145)
Total Bilirubin: 0.9 mg/dL (ref 0.3–1.2)
Total Protein: 7.6 g/dL (ref 6.5–8.1)

## 2018-11-30 LAB — CBC WITH DIFFERENTIAL (CANCER CENTER ONLY)
Abs Immature Granulocytes: 0.04 10*3/uL (ref 0.00–0.07)
Basophils Absolute: 0 10*3/uL (ref 0.0–0.1)
Basophils Relative: 1 %
Eosinophils Absolute: 0.1 10*3/uL (ref 0.0–0.5)
Eosinophils Relative: 1 %
HCT: 25.5 % — ABNORMAL LOW (ref 39.0–52.0)
Hemoglobin: 9.3 g/dL — ABNORMAL LOW (ref 13.0–17.0)
Immature Granulocytes: 1 %
Lymphocytes Relative: 0 %
Lymphs Abs: 0 10*3/uL — ABNORMAL LOW (ref 0.7–4.0)
MCH: 30.5 pg (ref 26.0–34.0)
MCHC: 36.5 g/dL — ABNORMAL HIGH (ref 30.0–36.0)
MCV: 83.6 fL (ref 80.0–100.0)
Monocytes Absolute: 0.3 10*3/uL (ref 0.1–1.0)
Monocytes Relative: 6 %
Neutro Abs: 4.1 10*3/uL (ref 1.7–7.7)
Neutrophils Relative %: 91 %
Platelet Count: 229 10*3/uL (ref 150–400)
RBC: 3.05 MIL/uL — ABNORMAL LOW (ref 4.22–5.81)
RDW: 18.8 % — ABNORMAL HIGH (ref 11.5–15.5)
WBC Count: 4.5 10*3/uL (ref 4.0–10.5)
nRBC: 12.2 % — ABNORMAL HIGH (ref 0.0–0.2)

## 2018-11-30 MED ORDER — BORTEZOMIB CHEMO SQ INJECTION 3.5 MG (2.5MG/ML)
1.5000 mg/m2 | Freq: Once | INTRAMUSCULAR | Status: AC
  Administered 2018-11-30: 3.25 mg via SUBCUTANEOUS
  Filled 2018-11-30: qty 1.3

## 2018-11-30 MED ORDER — PROCHLORPERAZINE MALEATE 10 MG PO TABS: 10.0000 mg | ORAL_TABLET | Freq: Once | ORAL | Status: DC

## 2018-11-30 NOTE — Patient Instructions (Signed)
[facility] Discharge Instructions for Patients Receiving Chemotherapy  Today you received the following chemotherapy agents Velcade  To help prevent nausea and vomiting after your treatment, we encourage you to take your nausea medication as directed   If you develop nausea and vomiting that is not controlled by your nausea medication, call the clinic.   BELOW ARE SYMPTOMS THAT SHOULD BE REPORTED IMMEDIATELY:  *FEVER GREATER THAN 100.5 F  *CHILLS WITH OR WITHOUT FEVER  NAUSEA AND VOMITING THAT IS NOT CONTROLLED WITH YOUR NAUSEA MEDICATION  *UNUSUAL SHORTNESS OF BREATH  *UNUSUAL BRUISING OR BLEEDING  TENDERNESS IN MOUTH AND THROAT WITH OR WITHOUT PRESENCE OF ULCERS  *URINARY PROBLEMS  *BOWEL PROBLEMS  UNUSUAL RASH Items with * indicate a potential emergency and should be followed up as soon as possible.  Feel free to call the clinic should you have any questions or concerns. The clinic phone number is [phone].  Please show the [facility] at check-in to the Emergency Department and triage nurse.

## 2018-12-01 DIAGNOSIS — N186 End stage renal disease: Secondary | ICD-10-CM | POA: Diagnosis not present

## 2018-12-01 DIAGNOSIS — D631 Anemia in chronic kidney disease: Secondary | ICD-10-CM | POA: Diagnosis not present

## 2018-12-01 DIAGNOSIS — E1129 Type 2 diabetes mellitus with other diabetic kidney complication: Secondary | ICD-10-CM | POA: Diagnosis not present

## 2018-12-01 DIAGNOSIS — D509 Iron deficiency anemia, unspecified: Secondary | ICD-10-CM | POA: Diagnosis not present

## 2018-12-01 DIAGNOSIS — N2581 Secondary hyperparathyroidism of renal origin: Secondary | ICD-10-CM | POA: Diagnosis not present

## 2018-12-02 ENCOUNTER — Telehealth: Payer: Self-pay | Admitting: *Deleted

## 2018-12-02 NOTE — Telephone Encounter (Signed)
Wife sylvia calling. States patient is usually a weekly treatment. Was treated 11/30/2018. Should have a treatment 12/07/2018. Not scheduled.

## 2018-12-02 NOTE — Telephone Encounter (Signed)
Please send a message to scheduling to correct this. He needs Lab + Infusion on 3/31.

## 2018-12-02 NOTE — Telephone Encounter (Signed)
[place] to schedulers for lab and infusion on 12/07/2018. Wife sylvia notified that schedulers would be calling her.

## 2018-12-03 DIAGNOSIS — D631 Anemia in chronic kidney disease: Secondary | ICD-10-CM | POA: Diagnosis not present

## 2018-12-03 DIAGNOSIS — D509 Iron deficiency anemia, unspecified: Secondary | ICD-10-CM | POA: Diagnosis not present

## 2018-12-03 DIAGNOSIS — N186 End stage renal disease: Secondary | ICD-10-CM | POA: Diagnosis not present

## 2018-12-03 DIAGNOSIS — N2581 Secondary hyperparathyroidism of renal origin: Secondary | ICD-10-CM | POA: Diagnosis not present

## 2018-12-03 DIAGNOSIS — E1129 Type 2 diabetes mellitus with other diabetic kidney complication: Secondary | ICD-10-CM | POA: Diagnosis not present

## 2018-12-06 DIAGNOSIS — E1129 Type 2 diabetes mellitus with other diabetic kidney complication: Secondary | ICD-10-CM | POA: Diagnosis not present

## 2018-12-06 DIAGNOSIS — N186 End stage renal disease: Secondary | ICD-10-CM | POA: Diagnosis not present

## 2018-12-06 DIAGNOSIS — D631 Anemia in chronic kidney disease: Secondary | ICD-10-CM | POA: Diagnosis not present

## 2018-12-06 DIAGNOSIS — D509 Iron deficiency anemia, unspecified: Secondary | ICD-10-CM | POA: Diagnosis not present

## 2018-12-06 DIAGNOSIS — N2581 Secondary hyperparathyroidism of renal origin: Secondary | ICD-10-CM | POA: Diagnosis not present

## 2018-12-07 ENCOUNTER — Inpatient Hospital Stay: Payer: Medicare Other

## 2018-12-07 ENCOUNTER — Other Ambulatory Visit: Payer: Self-pay

## 2018-12-07 VITALS — BP 159/89 | HR 73 | Temp 98.8°F | Resp 16

## 2018-12-07 DIAGNOSIS — C9000 Multiple myeloma not having achieved remission: Secondary | ICD-10-CM

## 2018-12-07 DIAGNOSIS — Z5112 Encounter for antineoplastic immunotherapy: Secondary | ICD-10-CM | POA: Diagnosis not present

## 2018-12-07 LAB — CBC WITH DIFFERENTIAL (CANCER CENTER ONLY)
Abs Immature Granulocytes: 0.01 10*3/uL (ref 0.00–0.07)
Basophils Absolute: 0.1 10*3/uL (ref 0.0–0.1)
Basophils Relative: 2 %
Eosinophils Absolute: 0.5 10*3/uL (ref 0.0–0.5)
Eosinophils Relative: 10 %
HCT: 22.9 % — ABNORMAL LOW (ref 39.0–52.0)
Hemoglobin: 8.3 g/dL — ABNORMAL LOW (ref 13.0–17.0)
Immature Granulocytes: 0 %
Lymphocytes Relative: 0 %
Lymphs Abs: 0 10*3/uL — ABNORMAL LOW (ref 0.7–4.0)
MCH: 30.5 pg (ref 26.0–34.0)
MCHC: 36.2 g/dL — ABNORMAL HIGH (ref 30.0–36.0)
MCV: 84.2 fL (ref 80.0–100.0)
Monocytes Absolute: 1 10*3/uL (ref 0.1–1.0)
Monocytes Relative: 22 %
Neutro Abs: 3.2 10*3/uL (ref 1.7–7.7)
Neutrophils Relative %: 66 %
Platelet Count: 232 10*3/uL (ref 150–400)
RBC: 2.72 MIL/uL — ABNORMAL LOW (ref 4.22–5.81)
RDW: 19.5 % — ABNORMAL HIGH (ref 11.5–15.5)
WBC Count: 4.8 10*3/uL (ref 4.0–10.5)
nRBC: 23.4 % — ABNORMAL HIGH (ref 0.0–0.2)

## 2018-12-07 LAB — CMP (CANCER CENTER ONLY)
ALT: 11 U/L (ref 0–44)
AST: 17 U/L (ref 15–41)
Albumin: 3.5 g/dL (ref 3.5–5.0)
Alkaline Phosphatase: 72 U/L (ref 38–126)
Anion gap: 13 (ref 5–15)
BUN: 17 mg/dL (ref 6–20)
CO2: 31 mmol/L (ref 22–32)
Calcium: 8.9 mg/dL (ref 8.9–10.3)
Chloride: 95 mmol/L — ABNORMAL LOW (ref 98–111)
Creatinine: 6.48 mg/dL (ref 0.61–1.24)
GFR, Est AFR Am: 10 mL/min — ABNORMAL LOW (ref >60)
GFR, Estimated: 9 mL/min — ABNORMAL LOW (ref >60)
Glucose, Bld: 122 mg/dL — ABNORMAL HIGH (ref 70–99)
Potassium: 3.6 mmol/L (ref 3.5–5.1)
Sodium: 139 mmol/L (ref 135–145)
Total Bilirubin: 0.9 mg/dL (ref 0.3–1.2)
Total Protein: 6.8 g/dL (ref 6.5–8.1)

## 2018-12-07 MED ORDER — PROCHLORPERAZINE MALEATE 10 MG PO TABS: 10.0000 mg | ORAL_TABLET | Freq: Once | ORAL | Status: DC

## 2018-12-07 MED ORDER — PROCHLORPERAZINE MALEATE 10 MG PO TABS
ORAL_TABLET | ORAL | Status: AC
  Filled 2018-12-07: qty 1

## 2018-12-07 MED ORDER — BORTEZOMIB CHEMO SQ INJECTION 3.5 MG (2.5MG/ML)
1.5000 mg/m2 | Freq: Once | INTRAMUSCULAR | Status: AC
  Administered 2018-12-07: 3.25 mg via SUBCUTANEOUS
  Filled 2018-12-07: qty 1.3

## 2018-12-07 NOTE — Patient Instructions (Signed)
Coronavirus (COVID-19) Are you at risk?  Are you at risk for the Coronavirus (COVID-19)?  To be considered HIGH RISK for Coronavirus (COVID-19), you have to meet the following criteria:  . Traveled to [place], [place], [place], [place] or [place]; or in the [place] to [place], [place], [place], or [place]; and have fever, cough, and shortness of breath within the last 2 weeks of travel OR . Been in close contact with a person diagnosed with COVID-19 within the last 2 weeks and have fever, cough, and shortness of breath . IF YOU DO NOT MEET THESE CRITERIA, YOU ARE CONSIDERED LOW RISK FOR COVID-19.  What to do if you are HIGH RISK for COVID-19?  [name] If you are having a medical emergency, call 911. . Seek medical care right away. Before you go to a doctor's office, urgent care or emergency department, call ahead and tell them about your recent travel, contact with someone diagnosed with COVID-19, and your symptoms. You should receive instructions from your physician's office regarding next steps of care.  . When you arrive at healthcare provider, tell the healthcare staff immediately you have returned from visiting [place], [place], [place], [place] or [place]; or traveled in the [place] to [place], [place], [place], or [place]; in the last two weeks or you have been in close contact with a person diagnosed with COVID-19 in the last 2 weeks.   . Tell the health care staff about your symptoms: fever, cough and shortness of breath. . After you have been seen by a medical provider, you will be either: o Tested for (COVID-19) and discharged home on quarantine except to seek medical care if symptoms worsen, and asked to  - Stay home and avoid contact with others until you get your results (4-5 days)  - Avoid travel on public transportation if possible (such as bus, train, or airplane) or o Sent to the Emergency Department by EMS for evaluation, COVID-19 testing, and possible  admission depending on your condition and test results.  What to do if you are LOW RISK for COVID-19?  Reduce your risk of any infection by using the same precautions used for avoiding the common cold or flu:  [name] Wash your hands often with soap and warm water for at least 20 seconds.  If soap and water are not readily available, use an alcohol-based hand sanitizer with at least 60% alcohol.  . If coughing or sneezing, cover your mouth and nose by coughing or sneezing into the elbow areas of your shirt or coat, into a tissue or into your sleeve (not your hands). . Avoid shaking hands with others and consider head nods or verbal greetings only. . Avoid touching your eyes, nose, or mouth with unwashed hands.  . Avoid close contact with people who are sick. . Avoid places or events with large numbers of people in one location, like concerts or sporting events. . Carefully consider travel plans you have or are making. . If you are planning any travel outside or inside the [place], visit the CDC's Travelers' Health webpage for the latest health notices. . If you have some symptoms but not all symptoms, continue to monitor at home and seek medical attention if your symptoms worsen. . If you are having a medical emergency, call 911.   [place] / e-Visit: [email or web address]         [facility]: [place]  [facility]: [facility]: [place] Discharge Instructions for Patients Receiving Chemotherapy  Today you received the following chemotherapy agents Velcade  To help prevent nausea and vomiting after your treatment, we encourage you to take your nausea medication as directed   If you develop nausea and vomiting that is not controlled by your nausea medication, call the clinic.   BELOW ARE  SYMPTOMS THAT SHOULD BE REPORTED IMMEDIATELY:  *FEVER GREATER THAN 100.5 F  *CHILLS WITH OR WITHOUT FEVER  NAUSEA AND VOMITING THAT IS NOT CONTROLLED WITH YOUR NAUSEA MEDICATION  *UNUSUAL SHORTNESS OF BREATH  *UNUSUAL BRUISING OR BLEEDING  TENDERNESS IN MOUTH AND THROAT WITH OR WITHOUT PRESENCE OF ULCERS  *URINARY PROBLEMS  *BOWEL PROBLEMS  UNUSUAL RASH Items with * indicate a potential emergency and should be followed up as soon as possible.  Feel free to call the clinic should you have any questions or concerns. The clinic phone number is [phone].  Please show the [place] at check-in to the Emergency Department and triage nurse.

## 2018-12-08 ENCOUNTER — Other Ambulatory Visit: Payer: Self-pay | Admitting: Oncology

## 2018-12-08 DIAGNOSIS — N2581 Secondary hyperparathyroidism of renal origin: Secondary | ICD-10-CM | POA: Diagnosis not present

## 2018-12-08 DIAGNOSIS — C9000 Multiple myeloma not having achieved remission: Secondary | ICD-10-CM

## 2018-12-08 DIAGNOSIS — E1122 Type 2 diabetes mellitus with diabetic chronic kidney disease: Secondary | ICD-10-CM | POA: Diagnosis not present

## 2018-12-08 DIAGNOSIS — E1129 Type 2 diabetes mellitus with other diabetic kidney complication: Secondary | ICD-10-CM | POA: Diagnosis not present

## 2018-12-08 DIAGNOSIS — Z992 Dependence on renal dialysis: Secondary | ICD-10-CM | POA: Diagnosis not present

## 2018-12-08 DIAGNOSIS — D509 Iron deficiency anemia, unspecified: Secondary | ICD-10-CM | POA: Diagnosis not present

## 2018-12-08 DIAGNOSIS — N186 End stage renal disease: Secondary | ICD-10-CM | POA: Diagnosis not present

## 2018-12-08 DIAGNOSIS — D631 Anemia in chronic kidney disease: Secondary | ICD-10-CM | POA: Diagnosis not present

## 2018-12-08 LAB — MULTIPLE MYELOMA PANEL, SERUM
Albumin SerPl Elph-Mcnc: 3.6 g/dL (ref 2.9–4.4)
Albumin/Glob SerPl: 1.4 (ref 0.7–1.7)
Alpha 1: 0.3 g/dL (ref 0.0–0.4)
Alpha2 Glob SerPl Elph-Mcnc: 0.7 g/dL (ref 0.4–1.0)
B-Globulin SerPl Elph-Mcnc: 0.5 g/dL — ABNORMAL LOW (ref 0.7–1.3)
Gamma Glob SerPl Elph-Mcnc: 1.1 g/dL (ref 0.4–1.8)
Globulin, Total: 2.6 g/dL (ref 2.2–3.9)
IgA: 36 mg/dL — ABNORMAL LOW (ref 90–386)
IgG (Immunoglobin G), Serum: 1234 mg/dL (ref 603–1613)
IgM (Immunoglobulin M), Srm: 13 mg/dL — ABNORMAL LOW (ref 20–172)
M Protein SerPl Elph-Mcnc: 0.6 g/dL — ABNORMAL HIGH
Total Protein ELP: 6.2 g/dL (ref 6.0–8.5)

## 2018-12-08 LAB — KAPPA/LAMBDA LIGHT CHAINS
Kappa free light chain: 218.8 mg/L — ABNORMAL HIGH (ref 3.3–19.4)
Kappa, lambda light chain ratio: 3.46 — ABNORMAL HIGH (ref 0.26–1.65)
Lambda free light chains: 63.3 mg/L — ABNORMAL HIGH (ref 5.7–26.3)

## 2018-12-08 MED FILL — VIMPAT 150 MG TABLET: 150 | 30 days supply | Qty: 60 | Fill #1

## 2018-12-08 MED FILL — clonazePAM 0.5 MG TABS: 0.5 | 15 days supply | Qty: 15 | Fill #1

## 2018-12-09 MED FILL — FREESTYLE LITE TEST STRIP: 90 days supply | Qty: 300 | Fill #0

## 2018-12-10 DIAGNOSIS — D631 Anemia in chronic kidney disease: Secondary | ICD-10-CM | POA: Diagnosis not present

## 2018-12-10 DIAGNOSIS — D509 Iron deficiency anemia, unspecified: Secondary | ICD-10-CM | POA: Diagnosis not present

## 2018-12-10 DIAGNOSIS — N2581 Secondary hyperparathyroidism of renal origin: Secondary | ICD-10-CM | POA: Diagnosis not present

## 2018-12-10 DIAGNOSIS — N186 End stage renal disease: Secondary | ICD-10-CM | POA: Diagnosis not present

## 2018-12-10 DIAGNOSIS — E1129 Type 2 diabetes mellitus with other diabetic kidney complication: Secondary | ICD-10-CM | POA: Diagnosis not present

## 2018-12-13 DIAGNOSIS — D509 Iron deficiency anemia, unspecified: Secondary | ICD-10-CM | POA: Diagnosis not present

## 2018-12-13 DIAGNOSIS — D631 Anemia in chronic kidney disease: Secondary | ICD-10-CM | POA: Diagnosis not present

## 2018-12-13 DIAGNOSIS — E1129 Type 2 diabetes mellitus with other diabetic kidney complication: Secondary | ICD-10-CM | POA: Diagnosis not present

## 2018-12-13 DIAGNOSIS — N186 End stage renal disease: Secondary | ICD-10-CM | POA: Diagnosis not present

## 2018-12-13 DIAGNOSIS — N2581 Secondary hyperparathyroidism of renal origin: Secondary | ICD-10-CM | POA: Diagnosis not present

## 2018-12-14 ENCOUNTER — Telehealth: Payer: Self-pay | Admitting: Oncology

## 2018-12-14 ENCOUNTER — Inpatient Hospital Stay (HOSPITAL_BASED_OUTPATIENT_CLINIC_OR_DEPARTMENT_OTHER): Payer: Medicare Other | Admitting: Oncology

## 2018-12-14 ENCOUNTER — Encounter: Payer: 59 | Admitting: Internal Medicine

## 2018-12-14 ENCOUNTER — Other Ambulatory Visit: Payer: Self-pay

## 2018-12-14 ENCOUNTER — Inpatient Hospital Stay: Payer: Medicare Other

## 2018-12-14 ENCOUNTER — Inpatient Hospital Stay: Payer: Medicare Other | Attending: Oncology

## 2018-12-14 VITALS — BP 143/72 | HR 79 | Temp 97.9°F | Resp 18 | Ht 64.0 in | Wt 207.3 lb

## 2018-12-14 DIAGNOSIS — E119 Type 2 diabetes mellitus without complications: Secondary | ICD-10-CM | POA: Diagnosis not present

## 2018-12-14 DIAGNOSIS — Z79899 Other long term (current) drug therapy: Secondary | ICD-10-CM

## 2018-12-14 DIAGNOSIS — C9000 Multiple myeloma not having achieved remission: Secondary | ICD-10-CM | POA: Diagnosis not present

## 2018-12-14 DIAGNOSIS — D649 Anemia, unspecified: Secondary | ICD-10-CM | POA: Diagnosis not present

## 2018-12-14 DIAGNOSIS — Z992 Dependence on renal dialysis: Secondary | ICD-10-CM

## 2018-12-14 DIAGNOSIS — N189 Chronic kidney disease, unspecified: Secondary | ICD-10-CM

## 2018-12-14 DIAGNOSIS — Z5112 Encounter for antineoplastic immunotherapy: Secondary | ICD-10-CM | POA: Insufficient documentation

## 2018-12-14 LAB — CBC WITH DIFFERENTIAL (CANCER CENTER ONLY)
Abs Immature Granulocytes: 0.02 10*3/uL (ref 0.00–0.07)
Basophils Absolute: 0.1 10*3/uL (ref 0.0–0.1)
Basophils Relative: 2 %
Eosinophils Absolute: 0.5 10*3/uL (ref 0.0–0.5)
Eosinophils Relative: 9 %
HCT: 26 % — ABNORMAL LOW (ref 39.0–52.0)
Hemoglobin: 9.4 g/dL — ABNORMAL LOW (ref 13.0–17.0)
Immature Granulocytes: 0 %
Lymphocytes Relative: 7 %
Lymphs Abs: 0.4 10*3/uL — ABNORMAL LOW (ref 0.7–4.0)
MCH: 30.6 pg (ref 26.0–34.0)
MCHC: 36.2 g/dL — ABNORMAL HIGH (ref 30.0–36.0)
MCV: 84.7 fL (ref 80.0–100.0)
Monocytes Absolute: 0.9 10*3/uL (ref 0.1–1.0)
Monocytes Relative: 16 %
Neutro Abs: 3.8 10*3/uL (ref 1.7–7.7)
Neutrophils Relative %: 66 %
Platelet Count: 175 10*3/uL (ref 150–400)
RBC: 3.07 MIL/uL — ABNORMAL LOW (ref 4.22–5.81)
RDW: 20 % — ABNORMAL HIGH (ref 11.5–15.5)
WBC Count: 5.7 10*3/uL (ref 4.0–10.5)
nRBC: 27.8 % — ABNORMAL HIGH (ref 0.0–0.2)

## 2018-12-14 LAB — CMP (CANCER CENTER ONLY)
ALT: 7 U/L (ref 0–44)
AST: 17 U/L (ref 15–41)
Albumin: 3.9 g/dL (ref 3.5–5.0)
Alkaline Phosphatase: 82 U/L (ref 38–126)
Anion gap: 14 (ref 5–15)
BUN: 21 mg/dL — ABNORMAL HIGH (ref 6–20)
CO2: 29 mmol/L (ref 22–32)
Calcium: 9.5 mg/dL (ref 8.9–10.3)
Chloride: 98 mmol/L (ref 98–111)
Creatinine: 7.07 mg/dL (ref 0.61–1.24)
GFR, Est AFR Am: 9 mL/min — ABNORMAL LOW (ref >60)
GFR, Estimated: 8 mL/min — ABNORMAL LOW (ref >60)
Glucose, Bld: 150 mg/dL — ABNORMAL HIGH (ref 70–99)
Potassium: 4.2 mmol/L (ref 3.5–5.1)
Sodium: 141 mmol/L (ref 135–145)
Total Bilirubin: 1 mg/dL (ref 0.3–1.2)
Total Protein: 7.5 g/dL (ref 6.5–8.1)

## 2018-12-14 MED ORDER — PROCHLORPERAZINE MALEATE 10 MG PO TABS: 10.0000 mg | ORAL_TABLET | Freq: Once | ORAL | Status: DC

## 2018-12-14 MED ORDER — BORTEZOMIB CHEMO SQ INJECTION 3.5 MG (2.5MG/ML)
1.5000 mg/m2 | Freq: Once | INTRAMUSCULAR | Status: AC
  Administered 2018-12-14: 3.25 mg via SUBCUTANEOUS
  Filled 2018-12-14: qty 1.3

## 2018-12-14 MED ORDER — PROCHLORPERAZINE MALEATE 10 MG PO TABS
ORAL_TABLET | ORAL | Status: AC
  Filled 2018-12-14: qty 1

## 2018-12-14 NOTE — Progress Notes (Signed)
Per Dr. Alen Blew okay to treat patient today with Scr. Of 7.07

## 2018-12-14 NOTE — Telephone Encounter (Signed)
Scheduled appt per 4/7 sch message.

## 2018-12-14 NOTE — Patient Instructions (Signed)
[place] Discharge Instructions for Patients Receiving Chemotherapy  Today you received the following chemotherapy agents Bortezomib (VELCADE).  To help prevent nausea and vomiting after your treatment, we encourage you to take your nausea medication as prescribed.   If you develop nausea and vomiting that is not controlled by your nausea medication, call the clinic.   BELOW ARE SYMPTOMS THAT SHOULD BE REPORTED IMMEDIATELY:  *FEVER GREATER THAN 100.5 F  *CHILLS WITH OR WITHOUT FEVER  NAUSEA AND VOMITING THAT IS NOT CONTROLLED WITH YOUR NAUSEA MEDICATION  *UNUSUAL SHORTNESS OF BREATH  *UNUSUAL BRUISING OR BLEEDING  TENDERNESS IN MOUTH AND THROAT WITH OR WITHOUT PRESENCE OF ULCERS  *URINARY PROBLEMS  *BOWEL PROBLEMS  UNUSUAL RASH Items with * indicate a potential emergency and should be followed up as soon as possible.  Feel free to call the clinic should you have any questions or concerns. The clinic phone number is [phone].  Please show the [facility] at check-in to the Emergency Department and triage nurse.  Coronavirus (COVID-19) Are you at risk?  Are you at risk for the Coronavirus (COVID-19)?  To be considered HIGH RISK for Coronavirus (COVID-19), you have to meet the following criteria:  . Traveled to [place], [place], [place], [place] or [place]; or in the [place] to [place], [place], [place], or [place]; and have fever, cough, and shortness of breath within the last 2 weeks of travel OR . Been in close contact with a person diagnosed with COVID-19 within the last 2 weeks and have fever, cough, and shortness of breath . IF YOU DO NOT MEET THESE CRITERIA, YOU ARE CONSIDERED LOW RISK FOR COVID-19.  What to do if you are HIGH RISK for COVID-19?  [name] If you are having a medical emergency, call 911. . Seek medical care right away. Before you go to a doctor's office, urgent care or emergency department, call ahead and tell them  about your recent travel, contact with someone diagnosed with COVID-19, and your symptoms. You should receive instructions from your physician's office regarding next steps of care.  . When you arrive at healthcare provider, tell the healthcare staff immediately you have returned from visiting [place], [place], [place], [place] or [place]; or traveled in the [place] to [place], [place], [place], or [place]; in the last two weeks or you have been in close contact with a person diagnosed with COVID-19 in the last 2 weeks.   . Tell the health care staff about your symptoms: fever, cough and shortness of breath. . After you have been seen by a medical provider, you will be either: o Tested for (COVID-19) and discharged home on quarantine except to seek medical care if symptoms worsen, and asked to  - Stay home and avoid contact with others until you get your results (4-5 days)  - Avoid travel on public transportation if possible (such as bus, train, or airplane) or o Sent to the Emergency Department by EMS for evaluation, COVID-19 testing, and possible admission depending on your condition and test results.  What to do if you are LOW RISK for COVID-19?  Reduce your risk of any infection by using the same precautions used for avoiding the common cold or flu:  [name] Wash your hands often with soap and warm water for at least 20 seconds.  If soap and water are not readily available, use an alcohol-based hand sanitizer with at least 60% alcohol.  . If coughing or  sneezing, cover your mouth and nose by coughing or sneezing into the elbow areas of your shirt or coat, into a tissue or into your sleeve (not your hands). . Avoid shaking hands with others and consider head nods or verbal greetings only. . Avoid touching your eyes, nose, or mouth with unwashed hands.  . Avoid close contact with people who are sick. . Avoid places or events with large numbers of people in one location, like concerts or  sporting events. . Carefully consider travel plans you have or are making. . If you are planning any travel outside or inside the [place], visit the CDC's Travelers' Health webpage for the latest health notices. . If you have some symptoms but not all symptoms, continue to monitor at home and seek medical attention if your symptoms worsen. . If you are having a medical emergency, call 911.   [place] / e-Visit: [email or web address]         [facility]: [facility]: [phone]                   [facility]: [phone]

## 2018-12-14 NOTE — Progress Notes (Signed)
Hematology and Oncology Follow Up Visit  Lincroft [id number] 21-Dec-1965 53 y.o. 12/14/2018 8:29 AM    Principle Diagnosis: 53 year old man with  1.  IgG multiple myeloma diagnosed in September 2019.  He was found to have 20% plasma involvement.  2.  Renal failure and dialysis dependent.  Not elated to plasma cell disorder.  Current therapy: Velcade dexamethasone and cyclophosphamide weekly started on 05/25/2018.  Velcade is given at 1.5 mg/m weekly, dexamethasone is 20 mg weekly and and Cytoxan is given at 650 mg total dose.    Interim History:  [name] presents today for a follow-up visit.  Since the last visit, he continues to tolerate chemotherapy without any major complaints.  He denies any nausea, vomiting or infusion related complications.  He denies any recent hospitalizations or illnesses.  He continues to receive dialysis without any complications.  He denies any fevers or chills or sweats.  He denies any infusion related complications.    He denied any alteration mental status, neuropathy, confusion or dizziness.  Denies any headaches or lethargy.  Denies any night sweats, weight loss or changes in appetite.  Denied orthopnea, dyspnea on exertion or chest discomfort.  Denies shortness of breath, difficulty breathing hemoptysis or cough.  Denies any abdominal distention, nausea, early satiety or dyspepsia.  Denies any hematuria, frequency, dysuria or nocturia.  Denies any skin irritation, dryness or rash.  Denies any ecchymosis or petechiae.  Denies any lymphadenopathy or clotting.  Denies any heat or cold intolerance.  Denies any anxiety or depression.  Remaining review of system is negative.        Medications: I have reviewed the patient's current medications.  Current Outpatient Medications:  .  acyclovir (ZOVIRAX) 400 MG tablet, TAKE 1 TABLET (400 MG TOTAL) BY MOUTH DAILY., Disp: 90 tablet, Rfl: 0 .  albuterol (PROAIR HFA) 108 (90 Base) MCG/ACT inhaler, Inhale two  puffs every 4-6 hours if needed for cough or wheeze, Disp: 1 Inhaler, Rfl: 1 .  aspirin EC 81 MG tablet, Take 1 tablet (81 mg total) by mouth daily., Disp: 100 tablet, Rfl: 0 .  calcium acetate (PHOSLO) 667 MG capsule, Take 2,001 mg by mouth 3 (three) times daily with meals. , Disp: , Rfl:  .  Cetirizine HCl 10 MG CAPS, Take 1 capsule by mouth daily., Disp: , Rfl:  .  cinacalcet (SENSIPAR) 60 MG tablet, Take 60 mg by mouth every other day. , Disp: , Rfl:  .  clonazePAM (KLONOPIN) 0.5 MG tablet, TAKE 1 TABLET BY MOUTH PRIOR TO EACH DIALYSIS SESSION, Disp: 15 tablet, Rfl: 2 .  cyclophosphamide (CYTOXAN) 50 MG capsule, TAKE 13 CAPSULES (650MG) BY MOUTH ONCE WEEKLY, TAKE EARLY IN THE DAY, Disp: 52 capsule, Rfl: 3 .  dexamethasone (DECADRON) 4 MG tablet, Take 5 tablets weekly, Disp: 90 tablet, Rfl: 3 .  fluticasone furoate-vilanterol (BREO ELLIPTA) 100-25 MCG/INH AEPB, Inhale one puff once daily to prevent cough or wheeze, Disp: 60 each, Rfl: 5 .  insulin lispro (HUMALOG) 100 UNIT/ML injection, Inject into the skin 3 (three) times daily before meals. 3 to 4 units TID prn SS, Disp: , Rfl:  .  Lacosamide (VIMPAT) 150 MG TABS, TAKE 1 TABLET BY MOUTH TWICE DAILY TAKE AN ADDITIONAL 1 TABLET AFTER DIALYSIS SESSION THREE TIMES A WEEK., Disp: 75 tablet, Rfl: 2 .  lamoTRIgine (LAMICTAL) 150 MG tablet, Take 1 tablet (150 mg total) by mouth 2 (two) times daily., Disp: 180 tablet, Rfl: 3 .  metoprolol tartrate (LOPRESSOR) 25 MG  tablet, Take 0.5 tablets (12.5 mg total) by mouth 2 (two) times daily., Disp: 60 tablet, Rfl: 0 .  midodrine (PROAMATINE) 10 MG tablet, Take 10 mg by mouth 2 (two) times daily with a meal. , Disp: , Rfl:  .  multivitamin (RENA-VIT) TABS tablet, Take 1 tablet by mouth at bedtime., Disp: 30 tablet, Rfl: 1 .  pantoprazole (PROTONIX) 40 MG tablet, Take 1 tablet (40 mg total) by mouth 2 (two) times daily. (Patient taking differently: Take 40 mg by mouth every morning. ), Disp: 60 tablet, Rfl: 1 .   prochlorperazine (COMPAZINE) 10 MG tablet, TAKE 1 TABLET (10 MG TOTAL) BY MOUTH EVERY 6 (SIX) HOURS AS NEEDED FOR NAUSEA OR VOMITING., Disp: 30 tablet, Rfl: 0  Allergies:  Allergies  Allergen Reactions  . Codeine Rash    Unknown reaction (patient says it was more serious than just a rash, but he can't remember what happened)    Past Medical History, Surgical history, Social history, and Family History were reviewed and updated.  Physical Exam:   Blood pressure (!) 143/72, pulse 79, temperature 97.9 F (36.6 C), temperature source Oral, resp. rate 18, height 5' 4"  (1.626 m), weight 207 lb 4.8 oz (94 kg), SpO2 99 %.      ECOG: 1     General appearance: Alert, awake without any distress. Head: Atraumatic without abnormalities Oropharynx: Without any thrush or ulcers. Eyes: No scleral icterus. Lymph nodes: No lymphadenopathy noted in the cervical, supraclavicular, or axillary nodes Heart:regular rate and rhythm, without any murmurs or gallops.   Lung: Clear to auscultation without any rhonchi, wheezes or dullness to percussion. Abdomin: Soft, nontender without any shifting dullness or ascites. Musculoskeletal: No clubbing or cyanosis. Neurological: No motor or sensory deficits. Skin: No rashes or lesions.          Lab Results: Lab Results  Component Value Date   WBC 4.8 12/07/2018   HGB 8.3 (L) 12/07/2018   HCT 22.9 (L) 12/07/2018   MCV 84.2 12/07/2018   PLT 232 12/07/2018     Chemistry      Component Value Date/Time   NA 139 12/07/2018 0821   NA 138 07/03/2017 0813   K 3.6 12/07/2018 0821   K 4.2 07/03/2017 0813   CL 95 (L) 12/07/2018 0821   CO2 31 12/07/2018 0821   CO2 27 07/03/2017 0813   BUN 17 12/07/2018 0821   BUN 46.4 (H) 07/03/2017 0813   CREATININE 6.48 (HH) 12/07/2018 0821   CREATININE 10.9 (HH) 07/03/2017 0813      Component Value Date/Time   CALCIUM 8.9 12/07/2018 0821   CALCIUM 9.3 07/03/2017 0813   ALKPHOS 72 12/07/2018 0821    ALKPHOS 77 07/03/2017 0813   AST 17 12/07/2018 0821   AST 16 07/03/2017 0813   ALT 11 12/07/2018 0821   ALT 14 07/03/2017 0813   BILITOT 0.9 12/07/2018 0821   BILITOT 0.93 07/03/2017 0813       Results for TRELL, SECRIST (MRN [id number]) as of 12/14/2018 08:46  Ref. Range 11/02/2018 11:03  Total Protein ELP Latest Ref Range: 6.0 - 8.5 g/dL 7.2  Albumin SerPl Elph-Mcnc Latest Ref Range: 2.9 - 4.4 g/dL 3.9  Albumin/Glob SerPl Latest Ref Range: 0.7 - 1.7  1.2  Alpha2 Glob SerPl Elph-Mcnc Latest Ref Range: 0.4 - 1.0 g/dL 0.7  Alpha 1 Latest Ref Range: 0.0 - 0.4 g/dL 0.4  Gamma Glob SerPl Elph-Mcnc Latest Ref Range: 0.4 - 1.8 g/dL 1.4  M Protein SerPl Elph-Mcnc Latest  Ref Range: Not Observed g/dL 0.6 (H)  Results for JERROD, DAMIANO (MRN [id number]) as of 12/14/2018 08:46  Ref. Range 12/07/2018 08:21  M Protein SerPl Elph-Mcnc Latest Ref Range: Not Observed g/dL 0.6 (H)       Impression and Plan:  53 year old man with:  1.  IgG multiple myeloma diagnosed in 2019 arising from an MGUS in 2012.   He continues to tolerate Velcade, dexamethasone and Cytoxan without any major complications.  Protein studies obtained on December 07, 2018 continues to show decline in his M protein but appears to have plateaued at 0.6 g/dL.  His IgG level is back within normal range.  Kappa to lambda ratio remains slightly elevated.  Risks and benefits of continuing this therapy for the time being was discussed.  Complication associated with this therapy was reiterated.  I recommended continuing this treatment to achieve CR close to it before attempting stem cell transplant.  He has also evaluation at [facility] regarding this issue.     2. Anemia: Hemoglobin overall stable and does not require any transfusion.  3. Renal failure: Continues to be hemodialysis dependent at this time without any issues.  4.  Zoster prophylaxis: No reactivation noted and remains on prophylaxis.  5.  Diabetes:  No exacerbation on dexamethasone.  6.  Nausea prophylaxis: No nausea or vomiting reported at this time.  7.  Goals of care: Myeloma is incurable but treatable condition and aggressive therapy is recommended.   8. Follow-up: We will continue for weekly chemotherapy and MD follow-up in 4 weeks.  25  minutes was spent with the patient face-to-face today.  More than 50% of time was spent on reviewing his disease status, treatment options and answering questions regarding future plan of care.     Zola Button, MD 4/7/20208:29 AM

## 2018-12-15 ENCOUNTER — Ambulatory Visit: Payer: 59 | Admitting: Neurology

## 2018-12-15 DIAGNOSIS — N186 End stage renal disease: Secondary | ICD-10-CM | POA: Diagnosis not present

## 2018-12-15 DIAGNOSIS — D631 Anemia in chronic kidney disease: Secondary | ICD-10-CM | POA: Diagnosis not present

## 2018-12-15 DIAGNOSIS — N2581 Secondary hyperparathyroidism of renal origin: Secondary | ICD-10-CM | POA: Diagnosis not present

## 2018-12-15 DIAGNOSIS — E1129 Type 2 diabetes mellitus with other diabetic kidney complication: Secondary | ICD-10-CM | POA: Diagnosis not present

## 2018-12-15 DIAGNOSIS — D509 Iron deficiency anemia, unspecified: Secondary | ICD-10-CM | POA: Diagnosis not present

## 2018-12-16 ENCOUNTER — Other Ambulatory Visit: Payer: Self-pay

## 2018-12-16 ENCOUNTER — Telehealth (INDEPENDENT_AMBULATORY_CARE_PROVIDER_SITE_OTHER): Payer: Medicare Other | Admitting: Neurology

## 2018-12-16 ENCOUNTER — Ambulatory Visit: Payer: 59 | Admitting: Neurology

## 2018-12-16 DIAGNOSIS — G40209 Localization-related (focal) (partial) symptomatic epilepsy and epileptic syndromes with complex partial seizures, not intractable, without status epilepticus: Secondary | ICD-10-CM | POA: Diagnosis not present

## 2018-12-16 DIAGNOSIS — I69351 Hemiplegia and hemiparesis following cerebral infarction affecting right dominant side: Secondary | ICD-10-CM

## 2018-12-16 MED ORDER — CLONAZEPAM 0.5 MG PO TABS: ORAL_TABLET | ORAL | 3 refills | Status: DC

## 2018-12-16 MED ORDER — LACOSAMIDE 150 MG PO TABS: ORAL_TABLET | ORAL | 3 refills | Status: DC

## 2018-12-16 MED ORDER — LAMOTRIGINE 150 MG PO TABS: 150.0000 mg | ORAL_TABLET | Freq: Two times a day (BID) | ORAL | 3 refills | Status: DC

## 2018-12-16 NOTE — Progress Notes (Signed)
Virtual Visit via Video Note The purpose of this virtual visit is to provide medical care while limiting exposure to the novel coronavirus.    Consent was obtained for video visit:  Yes.   Answered questions that patient had about telehealth interaction:  Yes.   I discussed the limitations, risks, security and privacy concerns of performing an evaluation and management service by telemedicine. I also discussed with the patient that there may be a patient responsible charge related to this service. The patient expressed understanding and agreed to proceed.  Pt location: Home Physician Location: office Name of referring provider:  [name], MD I connected with Isaiah [name] at patients initiation/request on 12/16/2018 at  9:00 AM EDT by video enabled telemedicine application and verified that I am speaking with the correct person using two identifiers. Pt MRN:  [id number] Pt DOB:  Feb 25, 1966 Video Participants:  Isaiah [name];  [name] (wife)   History of Present Illness:  The patient was last seen in August 2019. His wife is present during this e-visit to provide additional information. Since his last visit, he continues to do well with no seizures since addition of low dose clonazepam prior to dialsysi sessions, last seizure was in March 2019. He is also taking Lamotrigine 171m BID and Vimpat 1547mBID with additional dose of Vimpat after dialysis. He and his wife deny any staring/unresponsive episodes, gaps in time, olfactory/gustatory hallucinations, worsening focal weakness/numbness/tingling, myoclonic jerks. His wife has noticed occasional times where his mouth is twisted on the same side of his stroke, it does not last long, occurring once a week or every 2-3 weeks. He is able to talk and comprehend during these times, no facial twitching. He does not notice it himself. He denies any headaches, dizziness, vision changes, no falls. Mood is great. Sleep is okay, sometimes he wakes  up in the middle of the night but is able to go back to sleep. No daytime drowsiness, he does take naps during his dialysis sessions because "there is nothing else to do."   History on Initial Assessment 10/29/2017: This is a pleasant [age]o RH man with a history of diabetes, sickle-cell thalassemia, DVT and PE off Coumadin due to GI bleed, sleep apnea, MGUS, CKD on dialysis, and strokes in multiple vascular distributions in January 2017 with subsequent seizures. Stroke workup showed left M2 superior branch occlusion, TEE with PFO, hypercoagulation studies negative. He is now on aspirin for secondary stroke prevention. He made significant improvement with right-sided weakness and speech was more fluent. He had a seizure in April 2017 and was started on Vimpat 10053mID. EEG reported diffuse slowing. Limited MRI brain showed multiple late subacute and chronic infarcts, and repeat imaging in May 2017 showed resolution with no new infarct. He had another seizure while in [place] September 2017 after dialysis and was found to be anemic with Hgb of 6.8, requiring multiple blood transfusions. Vimpat increased to 150m27mD. EEG showed mild intermittent generalized theta slowing. He had another seizure after dialysis in November 2017 and Dilantin 300mg2mly was added. His wife did not want him to be on this long term, and was switched to Lamotrigine. He had another seizure in January 2018 a few days after Dilantin was tapered off. He had another seizure in October 2018 again after dialysis. The last seizure was on 10/19/17 again after dialysis. This was slightly different because he was belligerent and agitated and his wife had to calm him down. He is  taking Vimpat 129m BID and Lamotrigine 1584mBID with no side effects. He sometimes has a warning prior to the seizures. With the most recent seizure, he was standing at the kitchen table, recalls his wife picking him up, then coming to on the sofa. Seizures last 5-10  minutes, his wife describes staring off, unresponsive, then trembling/low amplitude shaking. He is tired after, with worsened right-sided weakness. He would be "pretty out of it" the next day. Majority of seizures occur right after dialysis, he would still be sitting in the dialysis unit, but the most recent seizure occurred at home an hour after dialysis. He reports BP is not an issue because his nephrologist has regulated his medications and BP is good most of the time.   He has occasional tremors in both legs, he has a right hand tremor noted in the office today. He noticed memory changes after his stroke, but does not think memory is worse after each seizure. He wonders about his short-term memory, sometimes he has to think which is his right or left side. He denies any olfactory/gustatory hallucinations, deja vu, rising epigastric sensation,myoclonic jerks.   Epilepsy Risk Factors:  Prior strokes in multiple vascular distributions. Otherwise he had a normal birth and early development.  There is no history of febrile convulsions, CNS infections such as meningitis/encephalitis, significant traumatic brain injury, neurosurgical procedures, or family history of seizures.  Diagnostic Data: I personally reviewed MRI brain without contrast done 01/2016 which showed left MCA infarct, right posterior temporal and occipital hemorrhagic infarct, left frontal infarct with remote blood products, remote hemorrhagic infarct in the more superior right frontal lobe, remote nonhemorrhagic lacunar infarcts in the left cerebellum.  Prior AEDs: Dilantin   Observations/Objective:   GEN:  The patient appears stated age and is in NAD.  Neurological examination:  Orientation: The patient is alert and oriented x3. Cranial nerves: Pupils equal, round. He has chronic right lower facial weakness with asymmetric smile. The speech is fluent and clear. Soft palate rises symmetrically and there is no tongue deviation.  Hearing is intact to conversational tone.  Motor: Strength is at least antigravity x 4 but with decreased fine finger movements on the right.  Coordination:  No ataxia on finger to nose testing Gait and Station: narrow-based and steady  Assessment and Plan:   This is a pleasant [age]o RH man with a history of  diabetes, sickle-cell thalassemia, DVT and PE off Coumadin due to GI bleed, sleep apnea, MGUS, CKD on dialysis, and strokes in multiple vascular distributions in January 2017 with subsequent seizures. He has residual mild right-sided weakness, and reported worsening right-sided weakness after seizures. 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 No further seizures since additional of low dose clonazepam 0.9m51m hour prior to dialysis last March 2019. Continue Vimpat 150m26mD with additional dose after dialysis, and Lamotrigine 150mg53m without side effects. His wife has noticed occasional transient right-sided mouth droop with no prior seizure symptoms, unlikely seizure-related, continue to monitor. He is aware of [place] driving laws with regards to seizures, no driving until 6 months seizure-free, we had previously discussed that with his prior stroke, would recommend driving evaluation with OT to determine safety before resuming driving. He will follow-up in 6 months and knows to call for any issues.   Follow Up Instructions:   -I discussed the assessment and treatment plan with the patient. The patient was provided an  opportunity to ask questions and all were answered. The patient agreed with the plan and demonstrated an understanding of the instructions.   The patient was advised to call back or seek an in-person evaluation if the symptoms worsen or if the condition fails to improve as anticipated.    Total Time spent in visit with the patient was 15 minutes, of which more than 50% of  the time was spent in counseling and/or coordinating care on the above.   Pt understands and agrees with the plan of care outlined.     Cameron Sprang, MD

## 2018-12-17 DIAGNOSIS — D509 Iron deficiency anemia, unspecified: Secondary | ICD-10-CM | POA: Diagnosis not present

## 2018-12-17 DIAGNOSIS — N186 End stage renal disease: Secondary | ICD-10-CM | POA: Diagnosis not present

## 2018-12-17 DIAGNOSIS — E1129 Type 2 diabetes mellitus with other diabetic kidney complication: Secondary | ICD-10-CM | POA: Diagnosis not present

## 2018-12-17 DIAGNOSIS — D631 Anemia in chronic kidney disease: Secondary | ICD-10-CM | POA: Diagnosis not present

## 2018-12-17 DIAGNOSIS — N2581 Secondary hyperparathyroidism of renal origin: Secondary | ICD-10-CM | POA: Diagnosis not present

## 2018-12-20 DIAGNOSIS — D631 Anemia in chronic kidney disease: Secondary | ICD-10-CM | POA: Diagnosis not present

## 2018-12-20 DIAGNOSIS — D509 Iron deficiency anemia, unspecified: Secondary | ICD-10-CM | POA: Diagnosis not present

## 2018-12-20 DIAGNOSIS — N186 End stage renal disease: Secondary | ICD-10-CM | POA: Diagnosis not present

## 2018-12-20 DIAGNOSIS — E1129 Type 2 diabetes mellitus with other diabetic kidney complication: Secondary | ICD-10-CM | POA: Diagnosis not present

## 2018-12-20 DIAGNOSIS — N2581 Secondary hyperparathyroidism of renal origin: Secondary | ICD-10-CM | POA: Diagnosis not present

## 2018-12-21 ENCOUNTER — Inpatient Hospital Stay: Payer: Medicare Other

## 2018-12-21 ENCOUNTER — Ambulatory Visit: Payer: 59 | Admitting: Internal Medicine

## 2018-12-21 ENCOUNTER — Other Ambulatory Visit: Payer: Self-pay

## 2018-12-21 VITALS — BP 138/73 | HR 77 | Temp 99.0°F | Resp 17

## 2018-12-21 DIAGNOSIS — C9000 Multiple myeloma not having achieved remission: Secondary | ICD-10-CM

## 2018-12-21 DIAGNOSIS — Z5112 Encounter for antineoplastic immunotherapy: Secondary | ICD-10-CM | POA: Diagnosis not present

## 2018-12-21 LAB — CMP (CANCER CENTER ONLY)
ALT: <6 U/L (ref 0–44)
AST: 13 U/L — ABNORMAL LOW (ref 15–41)
Albumin: 3.5 g/dL (ref 3.5–5.0)
Alkaline Phosphatase: 77 U/L (ref 38–126)
Anion gap: 13 (ref 5–15)
BUN: 21 mg/dL — ABNORMAL HIGH (ref 6–20)
CO2: 31 mmol/L (ref 22–32)
Calcium: 9 mg/dL (ref 8.9–10.3)
Chloride: 96 mmol/L — ABNORMAL LOW (ref 98–111)
Creatinine: 7.18 mg/dL (ref 0.61–1.24)
GFR, Est AFR Am: 9 mL/min — ABNORMAL LOW (ref >60)
GFR, Estimated: 8 mL/min — ABNORMAL LOW (ref >60)
Glucose, Bld: 140 mg/dL — ABNORMAL HIGH (ref 70–99)
Potassium: 4.1 mmol/L (ref 3.5–5.1)
Sodium: 140 mmol/L (ref 135–145)
Total Bilirubin: 0.8 mg/dL (ref 0.3–1.2)
Total Protein: 6.8 g/dL (ref 6.5–8.1)

## 2018-12-21 LAB — CBC WITH DIFFERENTIAL (CANCER CENTER ONLY)
Abs Immature Granulocytes: 0.02 10*3/uL (ref 0.00–0.07)
Basophils Absolute: 0.2 10*3/uL — ABNORMAL HIGH (ref 0.0–0.1)
Basophils Relative: 3 %
Eosinophils Absolute: 0.6 10*3/uL — ABNORMAL HIGH (ref 0.0–0.5)
Eosinophils Relative: 10 %
HCT: 25.8 % — ABNORMAL LOW (ref 39.0–52.0)
Hemoglobin: 9.6 g/dL — ABNORMAL LOW (ref 13.0–17.0)
Immature Granulocytes: 0 %
Lymphocytes Relative: 0 %
Lymphs Abs: 0 10*3/uL — ABNORMAL LOW (ref 0.7–4.0)
MCH: 32 pg (ref 26.0–34.0)
MCHC: 37.2 g/dL — ABNORMAL HIGH (ref 30.0–36.0)
MCV: 86 fL (ref 80.0–100.0)
Monocytes Absolute: 1.3 10*3/uL — ABNORMAL HIGH (ref 0.1–1.0)
Monocytes Relative: 22 %
Neutro Abs: 3.7 10*3/uL (ref 1.7–7.7)
Neutrophils Relative %: 65 %
Platelet Count: 215 10*3/uL (ref 150–400)
RBC: 3 MIL/uL — ABNORMAL LOW (ref 4.22–5.81)
RDW: 19.8 % — ABNORMAL HIGH (ref 11.5–15.5)
WBC Count: 5.8 10*3/uL (ref 4.0–10.5)
nRBC: 15 % — ABNORMAL HIGH (ref 0.0–0.2)

## 2018-12-21 MED ORDER — PROCHLORPERAZINE MALEATE 10 MG PO TABS: 10.0000 mg | ORAL_TABLET | Freq: Once | ORAL | Status: DC

## 2018-12-21 MED ORDER — BORTEZOMIB CHEMO SQ INJECTION 3.5 MG (2.5MG/ML)
1.5000 mg/m2 | Freq: Once | INTRAMUSCULAR | Status: AC
  Administered 2018-12-21: 3.25 mg via SUBCUTANEOUS
  Filled 2018-12-21: qty 1.3

## 2018-12-21 NOTE — Patient Instructions (Signed)
[place] [place] Discharge Instructions for Patients Receiving Chemotherapy  Today you received the following chemotherapy agents Bortezomib (VELCADE).  To help prevent nausea and vomiting after your treatment, we encourage you to take your nausea medication as prescribed.   If you develop nausea and vomiting that is not controlled by your nausea medication, call the clinic.   BELOW ARE SYMPTOMS THAT SHOULD BE REPORTED IMMEDIATELY:  *FEVER GREATER THAN 100.5 F  *CHILLS WITH OR WITHOUT FEVER  NAUSEA AND VOMITING THAT IS NOT CONTROLLED WITH YOUR NAUSEA MEDICATION  *UNUSUAL SHORTNESS OF BREATH  *UNUSUAL BRUISING OR BLEEDING  TENDERNESS IN MOUTH AND THROAT WITH OR WITHOUT PRESENCE OF ULCERS  *URINARY PROBLEMS  *BOWEL PROBLEMS  UNUSUAL RASH Items with * indicate a potential emergency and should be followed up as soon as possible.  Feel free to call the clinic should you have any questions or concerns. The clinic phone number is [phone].  Please show the [place] at check-in to the Emergency Department and triage nurse.

## 2018-12-22 DIAGNOSIS — N2581 Secondary hyperparathyroidism of renal origin: Secondary | ICD-10-CM | POA: Diagnosis not present

## 2018-12-22 DIAGNOSIS — D509 Iron deficiency anemia, unspecified: Secondary | ICD-10-CM | POA: Diagnosis not present

## 2018-12-22 DIAGNOSIS — N186 End stage renal disease: Secondary | ICD-10-CM | POA: Diagnosis not present

## 2018-12-22 DIAGNOSIS — D631 Anemia in chronic kidney disease: Secondary | ICD-10-CM | POA: Diagnosis not present

## 2018-12-22 DIAGNOSIS — E1129 Type 2 diabetes mellitus with other diabetic kidney complication: Secondary | ICD-10-CM | POA: Diagnosis not present

## 2018-12-24 DIAGNOSIS — N2581 Secondary hyperparathyroidism of renal origin: Secondary | ICD-10-CM | POA: Diagnosis not present

## 2018-12-24 DIAGNOSIS — D509 Iron deficiency anemia, unspecified: Secondary | ICD-10-CM | POA: Diagnosis not present

## 2018-12-24 DIAGNOSIS — E1129 Type 2 diabetes mellitus with other diabetic kidney complication: Secondary | ICD-10-CM | POA: Diagnosis not present

## 2018-12-24 DIAGNOSIS — N186 End stage renal disease: Secondary | ICD-10-CM | POA: Diagnosis not present

## 2018-12-24 DIAGNOSIS — D631 Anemia in chronic kidney disease: Secondary | ICD-10-CM | POA: Diagnosis not present

## 2018-12-27 DIAGNOSIS — E1129 Type 2 diabetes mellitus with other diabetic kidney complication: Secondary | ICD-10-CM | POA: Diagnosis not present

## 2018-12-27 DIAGNOSIS — N186 End stage renal disease: Secondary | ICD-10-CM | POA: Diagnosis not present

## 2018-12-27 DIAGNOSIS — E1065 Type 1 diabetes mellitus with hyperglycemia: Secondary | ICD-10-CM | POA: Diagnosis not present

## 2018-12-27 DIAGNOSIS — D509 Iron deficiency anemia, unspecified: Secondary | ICD-10-CM | POA: Diagnosis not present

## 2018-12-27 DIAGNOSIS — D631 Anemia in chronic kidney disease: Secondary | ICD-10-CM | POA: Diagnosis not present

## 2018-12-27 DIAGNOSIS — N2581 Secondary hyperparathyroidism of renal origin: Secondary | ICD-10-CM | POA: Diagnosis not present

## 2018-12-28 ENCOUNTER — Other Ambulatory Visit: Payer: Self-pay

## 2018-12-28 ENCOUNTER — Inpatient Hospital Stay: Payer: Medicare Other

## 2018-12-28 VITALS — BP 143/90 | HR 79 | Temp 99.3°F | Resp 18

## 2018-12-28 DIAGNOSIS — C9000 Multiple myeloma not having achieved remission: Secondary | ICD-10-CM | POA: Diagnosis not present

## 2018-12-28 DIAGNOSIS — Z5112 Encounter for antineoplastic immunotherapy: Secondary | ICD-10-CM | POA: Diagnosis not present

## 2018-12-28 LAB — CBC WITH DIFFERENTIAL (CANCER CENTER ONLY)
Abs Immature Granulocytes: 0.02 10*3/uL (ref 0.00–0.07)
Basophils Absolute: 0.2 10*3/uL — ABNORMAL HIGH (ref 0.0–0.1)
Basophils Relative: 2 %
Eosinophils Absolute: 0.5 10*3/uL (ref 0.0–0.5)
Eosinophils Relative: 8 %
HCT: 26.2 % — ABNORMAL LOW (ref 39.0–52.0)
Hemoglobin: 9.7 g/dL — ABNORMAL LOW (ref 13.0–17.0)
Immature Granulocytes: 0 %
Lymphocytes Relative: 0 %
Lymphs Abs: 0 10*3/uL — ABNORMAL LOW (ref 0.7–4.0)
MCH: 31.4 pg (ref 26.0–34.0)
MCHC: 37 g/dL — ABNORMAL HIGH (ref 30.0–36.0)
MCV: 84.8 fL (ref 80.0–100.0)
Monocytes Absolute: 0.7 10*3/uL (ref 0.1–1.0)
Monocytes Relative: 11 %
Neutro Abs: 5.1 10*3/uL (ref 1.7–7.7)
Neutrophils Relative %: 79 %
Platelet Count: 204 10*3/uL (ref 150–400)
RBC: 3.09 MIL/uL — ABNORMAL LOW (ref 4.22–5.81)
RDW: 19.8 % — ABNORMAL HIGH (ref 11.5–15.5)
WBC Count: 6.4 10*3/uL (ref 4.0–10.5)
nRBC: 12 % — ABNORMAL HIGH (ref 0.0–0.2)

## 2018-12-28 LAB — CMP (CANCER CENTER ONLY)
ALT: <6 U/L (ref 0–44)
AST: 12 U/L — ABNORMAL LOW (ref 15–41)
Albumin: 3.4 g/dL — ABNORMAL LOW (ref 3.5–5.0)
Alkaline Phosphatase: 72 U/L (ref 38–126)
Anion gap: 13 (ref 5–15)
BUN: 26 mg/dL — ABNORMAL HIGH (ref 6–20)
CO2: 32 mmol/L (ref 22–32)
Calcium: 9.1 mg/dL (ref 8.9–10.3)
Chloride: 95 mmol/L — ABNORMAL LOW (ref 98–111)
Creatinine: 7.37 mg/dL (ref 0.61–1.24)
GFR, Est AFR Am: 9 mL/min — ABNORMAL LOW (ref >60)
GFR, Estimated: 8 mL/min — ABNORMAL LOW (ref >60)
Glucose, Bld: 156 mg/dL — ABNORMAL HIGH (ref 70–99)
Potassium: 4 mmol/L (ref 3.5–5.1)
Sodium: 140 mmol/L (ref 135–145)
Total Bilirubin: 0.8 mg/dL (ref 0.3–1.2)
Total Protein: 6.7 g/dL (ref 6.5–8.1)

## 2018-12-28 MED ORDER — PROCHLORPERAZINE MALEATE 10 MG PO TABS: 10.0000 mg | ORAL_TABLET | Freq: Once | ORAL | Status: DC

## 2018-12-28 MED ORDER — BORTEZOMIB CHEMO SQ INJECTION 3.5 MG (2.5MG/ML)
1.5000 mg/m2 | Freq: Once | INTRAMUSCULAR | Status: AC
  Administered 2018-12-28: 3.25 mg via SUBCUTANEOUS
  Filled 2018-12-28: qty 1.3

## 2018-12-28 NOTE — Patient Instructions (Signed)
Coronavirus (COVID-19) Are you at risk?  Are you at risk for the Coronavirus (COVID-19)?  To be considered HIGH RISK for Coronavirus (COVID-19), you have to meet the following criteria:  . Traveled to [place], [place], [place], [place] or [place]; or in the [place] to [place], [place], [place], or [place]; and have fever, cough, and shortness of breath within the last 2 weeks of travel OR . Been in close contact with a person diagnosed with COVID-19 within the last 2 weeks and have fever, cough, and shortness of breath . IF YOU DO NOT MEET THESE CRITERIA, YOU ARE CONSIDERED LOW RISK FOR COVID-19.  What to do if you are HIGH RISK for COVID-19?  [name] If you are having a medical emergency, call 911. . Seek medical care right away. Before you go to a doctor's office, urgent care or emergency department, call ahead and tell them about your recent travel, contact with someone diagnosed with COVID-19, and your symptoms. You should receive instructions from your physician's office regarding next steps of care.  . When you arrive at healthcare provider, tell the healthcare staff immediately you have returned from visiting [place], [place], [place], [place] or [place]; or traveled in the [place] to [place], [place], [place], or [place]; in the last two weeks or you have been in close contact with a person diagnosed with COVID-19 in the last 2 weeks.   . Tell the health care staff about your symptoms: fever, cough and shortness of breath. . After you have been seen by a medical provider, you will be either: o Tested for (COVID-19) and discharged home on quarantine except to seek medical care if symptoms worsen, and asked to  - Stay home and avoid contact with others until you get your results (4-5 days)  - Avoid travel on public transportation if possible (such as bus, train, or airplane) or o Sent to the Emergency Department by EMS for evaluation, COVID-19 testing, and possible  admission depending on your condition and test results.  What to do if you are LOW RISK for COVID-19?  Reduce your risk of any infection by using the same precautions used for avoiding the common cold or flu:  [name] Wash your hands often with soap and warm water for at least 20 seconds.  If soap and water are not readily available, use an alcohol-based hand sanitizer with at least 60% alcohol.  . If coughing or sneezing, cover your mouth and nose by coughing or sneezing into the elbow areas of your shirt or coat, into a tissue or into your sleeve (not your hands). . Avoid shaking hands with others and consider head nods or verbal greetings only. . Avoid touching your eyes, nose, or mouth with unwashed hands.  . Avoid close contact with people who are sick. . Avoid places or events with large numbers of people in one location, like concerts or sporting events. . Carefully consider travel plans you have or are making. . If you are planning any travel outside or inside the [place], visit the CDC's Travelers' Health webpage for the latest health notices. . If you have some symptoms but not all symptoms, continue to monitor at home and seek medical attention if your symptoms worsen. . If you are having a medical emergency, call 911.   [place] / e-Visit: [email or web address]         [facility]: [place]  [facility]: [place] [facility]: [place] Discharge Instructions for Patients Receiving Chemotherapy  Today you received the following chemotherapy agents Velcade  To help prevent nausea and vomiting after your treatment, we encourage you to take your nausea medication as directed.    If you develop nausea and vomiting that is not controlled by your nausea medication, call the clinic.   BELOW ARE  SYMPTOMS THAT SHOULD BE REPORTED IMMEDIATELY:  *FEVER GREATER THAN 100.5 F  *CHILLS WITH OR WITHOUT FEVER  NAUSEA AND VOMITING THAT IS NOT CONTROLLED WITH YOUR NAUSEA MEDICATION  *UNUSUAL SHORTNESS OF BREATH  *UNUSUAL BRUISING OR BLEEDING  TENDERNESS IN MOUTH AND THROAT WITH OR WITHOUT PRESENCE OF ULCERS  *URINARY PROBLEMS  *BOWEL PROBLEMS  UNUSUAL RASH Items with * indicate a potential emergency and should be followed up as soon as possible.  Feel free to call the clinic should you have any questions or concerns. The clinic phone number is [phone].  Please show the [place] at check-in to the Emergency Department and triage nurse.

## 2018-12-29 DIAGNOSIS — E1129 Type 2 diabetes mellitus with other diabetic kidney complication: Secondary | ICD-10-CM | POA: Diagnosis not present

## 2018-12-29 DIAGNOSIS — D631 Anemia in chronic kidney disease: Secondary | ICD-10-CM | POA: Diagnosis not present

## 2018-12-29 DIAGNOSIS — D509 Iron deficiency anemia, unspecified: Secondary | ICD-10-CM | POA: Diagnosis not present

## 2018-12-29 DIAGNOSIS — N186 End stage renal disease: Secondary | ICD-10-CM | POA: Diagnosis not present

## 2018-12-29 DIAGNOSIS — N2581 Secondary hyperparathyroidism of renal origin: Secondary | ICD-10-CM | POA: Diagnosis not present

## 2018-12-30 ENCOUNTER — Other Ambulatory Visit: Payer: Self-pay | Admitting: Internal Medicine

## 2018-12-30 ENCOUNTER — Encounter: Payer: Self-pay | Admitting: Internal Medicine

## 2018-12-30 MED FILL — BREO ELLIPTA 200-25 MCG INH: 200-25 | 30 days supply | Qty: 60 | Fill #0

## 2018-12-31 DIAGNOSIS — E1129 Type 2 diabetes mellitus with other diabetic kidney complication: Secondary | ICD-10-CM | POA: Diagnosis not present

## 2018-12-31 DIAGNOSIS — N186 End stage renal disease: Secondary | ICD-10-CM | POA: Diagnosis not present

## 2018-12-31 DIAGNOSIS — N2581 Secondary hyperparathyroidism of renal origin: Secondary | ICD-10-CM | POA: Diagnosis not present

## 2018-12-31 DIAGNOSIS — D509 Iron deficiency anemia, unspecified: Secondary | ICD-10-CM | POA: Diagnosis not present

## 2018-12-31 DIAGNOSIS — D631 Anemia in chronic kidney disease: Secondary | ICD-10-CM | POA: Diagnosis not present

## 2018-12-31 MED FILL — CYCLOPHOSPHAMIDE 50 MG CAPS: 50 | 28 days supply | Qty: 52 | Fill #0

## 2019-01-03 DIAGNOSIS — N2581 Secondary hyperparathyroidism of renal origin: Secondary | ICD-10-CM | POA: Diagnosis not present

## 2019-01-03 DIAGNOSIS — N186 End stage renal disease: Secondary | ICD-10-CM | POA: Diagnosis not present

## 2019-01-03 DIAGNOSIS — E1129 Type 2 diabetes mellitus with other diabetic kidney complication: Secondary | ICD-10-CM | POA: Diagnosis not present

## 2019-01-03 DIAGNOSIS — D509 Iron deficiency anemia, unspecified: Secondary | ICD-10-CM | POA: Diagnosis not present

## 2019-01-03 DIAGNOSIS — D631 Anemia in chronic kidney disease: Secondary | ICD-10-CM | POA: Diagnosis not present

## 2019-01-04 ENCOUNTER — Other Ambulatory Visit: Payer: Self-pay

## 2019-01-04 ENCOUNTER — Inpatient Hospital Stay: Payer: Medicare Other

## 2019-01-04 ENCOUNTER — Telehealth: Payer: Self-pay | Admitting: *Deleted

## 2019-01-04 VITALS — BP 156/76 | HR 67 | Temp 98.9°F | Resp 18

## 2019-01-04 DIAGNOSIS — Z5112 Encounter for antineoplastic immunotherapy: Secondary | ICD-10-CM | POA: Diagnosis not present

## 2019-01-04 DIAGNOSIS — C9000 Multiple myeloma not having achieved remission: Secondary | ICD-10-CM

## 2019-01-04 LAB — CBC WITH DIFFERENTIAL (CANCER CENTER ONLY)
Abs Immature Granulocytes: 0.03 10*3/uL (ref 0.00–0.07)
Basophils Absolute: 0.1 10*3/uL (ref 0.0–0.1)
Basophils Relative: 2 %
Eosinophils Absolute: 0.7 10*3/uL — ABNORMAL HIGH (ref 0.0–0.5)
Eosinophils Relative: 11 %
HCT: 26.9 % — ABNORMAL LOW (ref 39.0–52.0)
Hemoglobin: 9.8 g/dL — ABNORMAL LOW (ref 13.0–17.0)
Immature Granulocytes: 1 %
Lymphocytes Relative: 0 %
Lymphs Abs: 0 10*3/uL — ABNORMAL LOW (ref 0.7–4.0)
MCH: 31.1 pg (ref 26.0–34.0)
MCHC: 36.4 g/dL — ABNORMAL HIGH (ref 30.0–36.0)
MCV: 85.4 fL (ref 80.0–100.0)
Monocytes Absolute: 0.9 10*3/uL (ref 0.1–1.0)
Monocytes Relative: 14 %
Neutro Abs: 4.6 10*3/uL (ref 1.7–7.7)
Neutrophils Relative %: 72 %
Platelet Count: 211 10*3/uL (ref 150–400)
RBC: 3.15 MIL/uL — ABNORMAL LOW (ref 4.22–5.81)
RDW: 19.7 % — ABNORMAL HIGH (ref 11.5–15.5)
WBC Count: 6.4 10*3/uL (ref 4.0–10.5)
nRBC: 15.4 % — ABNORMAL HIGH (ref 0.0–0.2)

## 2019-01-04 LAB — CMP (CANCER CENTER ONLY)
ALT: 6 U/L (ref 0–44)
AST: 13 U/L — ABNORMAL LOW (ref 15–41)
Albumin: 3.5 g/dL (ref 3.5–5.0)
Alkaline Phosphatase: 78 U/L (ref 38–126)
Anion gap: 14 (ref 5–15)
BUN: 25 mg/dL — ABNORMAL HIGH (ref 6–20)
CO2: 31 mmol/L (ref 22–32)
Calcium: 8.7 mg/dL — ABNORMAL LOW (ref 8.9–10.3)
Chloride: 94 mmol/L — ABNORMAL LOW (ref 98–111)
Creatinine: 6.97 mg/dL (ref 0.61–1.24)
GFR, Est AFR Am: 9 mL/min — ABNORMAL LOW (ref >60)
GFR, Estimated: 8 mL/min — ABNORMAL LOW (ref >60)
Glucose, Bld: 138 mg/dL — ABNORMAL HIGH (ref 70–99)
Potassium: 4.1 mmol/L (ref 3.5–5.1)
Sodium: 139 mmol/L (ref 135–145)
Total Bilirubin: 0.7 mg/dL (ref 0.3–1.2)
Total Protein: 6.8 g/dL (ref 6.5–8.1)

## 2019-01-04 MED ORDER — PROCHLORPERAZINE MALEATE 10 MG PO TABS: 10.0000 mg | ORAL_TABLET | Freq: Once | ORAL | Status: DC

## 2019-01-04 MED ORDER — BORTEZOMIB CHEMO SQ INJECTION 3.5 MG (2.5MG/ML)
1.5000 mg/m2 | Freq: Once | INTRAMUSCULAR | Status: AC
  Administered 2019-01-04: 3.25 mg via SUBCUTANEOUS
  Filled 2019-01-04: qty 1.3

## 2019-01-04 NOTE — Telephone Encounter (Signed)
Received call report from [facility].  "Today's Creat = 6.97."  Called collaborative with results.     Further communication through infusion nurse per treatment protocol standards.  Patient receives M/W/F dialysis.

## 2019-01-04 NOTE — Patient Instructions (Signed)
Coronavirus (COVID-19) Are you at risk?  Are you at risk for the Coronavirus (COVID-19)?  To be considered HIGH RISK for Coronavirus (COVID-19), you have to meet the following criteria:  . Traveled to [place], [place], [place], [place] or [place]; or in the [place] to [place], [place], [place], or [place]; and have fever, cough, and shortness of breath within the last 2 weeks of travel OR . Been in close contact with a person diagnosed with COVID-19 within the last 2 weeks and have fever, cough, and shortness of breath . IF YOU DO NOT MEET THESE CRITERIA, YOU ARE CONSIDERED LOW RISK FOR COVID-19.  What to do if you are HIGH RISK for COVID-19?  [name] If you are having a medical emergency, call 911. . Seek medical care right away. Before you go to a doctor's office, urgent care or emergency department, call ahead and tell them about your recent travel, contact with someone diagnosed with COVID-19, and your symptoms. You should receive instructions from your physician's office regarding next steps of care.  . When you arrive at healthcare provider, tell the healthcare staff immediately you have returned from visiting [place], [place], [place], [place] or [place]; or traveled in the [place] to [place], [place], [place], or [place]; in the last two weeks or you have been in close contact with a person diagnosed with COVID-19 in the last 2 weeks.   . Tell the health care staff about your symptoms: fever, cough and shortness of breath. . After you have been seen by a medical provider, you will be either: o Tested for (COVID-19) and discharged home on quarantine except to seek medical care if symptoms worsen, and asked to  - Stay home and avoid contact with others until you get your results (4-5 days)  - Avoid travel on public transportation if possible (such as bus, train, or airplane) or o Sent to the Emergency Department by EMS for evaluation, COVID-19 testing, and possible  admission depending on your condition and test results.  What to do if you are LOW RISK for COVID-19?  Reduce your risk of any infection by using the same precautions used for avoiding the common cold or flu:  [name] Wash your hands often with soap and warm water for at least 20 seconds.  If soap and water are not readily available, use an alcohol-based hand sanitizer with at least 60% alcohol.  . If coughing or sneezing, cover your mouth and nose by coughing or sneezing into the elbow areas of your shirt or coat, into a tissue or into your sleeve (not your hands). . Avoid shaking hands with others and consider head nods or verbal greetings only. . Avoid touching your eyes, nose, or mouth with unwashed hands.  . Avoid close contact with people who are sick. . Avoid places or events with large numbers of people in one location, like concerts or sporting events. . Carefully consider travel plans you have or are making. . If you are planning any travel outside or inside the [place], visit the CDC's Travelers' Health webpage for the latest health notices. . If you have some symptoms but not all symptoms, continue to monitor at home and seek medical attention if your symptoms worsen. . If you are having a medical emergency, call 911.   [employer] [place] / e-Visit: [email or web address]         [facility]: [place]  [facility]: [place] [facility]: [place] Discharge Instructions for Patients Receiving Chemotherapy  Today you received the following chemotherapy agents Velcade  To help prevent nausea and vomiting after your treatment, we encourage you to take your nausea medication as directed.    If you develop nausea and vomiting that is not controlled by your nausea medication, call the clinic.   BELOW ARE  SYMPTOMS THAT SHOULD BE REPORTED IMMEDIATELY:  *FEVER GREATER THAN 100.5 F  *CHILLS WITH OR WITHOUT FEVER  NAUSEA AND VOMITING THAT IS NOT CONTROLLED WITH YOUR NAUSEA MEDICATION  *UNUSUAL SHORTNESS OF BREATH  *UNUSUAL BRUISING OR BLEEDING  TENDERNESS IN MOUTH AND THROAT WITH OR WITHOUT PRESENCE OF ULCERS  *URINARY PROBLEMS  *BOWEL PROBLEMS  UNUSUAL RASH Items with * indicate a potential emergency and should be followed up as soon as possible.  Feel free to call the clinic should you have any questions or concerns. The clinic phone number is [phone].  Please show the [place] at check-in to the Emergency Department and triage nurse.

## 2019-01-05 ENCOUNTER — Telehealth: Payer: Self-pay | Admitting: Neurology

## 2019-01-05 DIAGNOSIS — N2581 Secondary hyperparathyroidism of renal origin: Secondary | ICD-10-CM | POA: Diagnosis not present

## 2019-01-05 DIAGNOSIS — E1129 Type 2 diabetes mellitus with other diabetic kidney complication: Secondary | ICD-10-CM | POA: Diagnosis not present

## 2019-01-05 DIAGNOSIS — D631 Anemia in chronic kidney disease: Secondary | ICD-10-CM | POA: Diagnosis not present

## 2019-01-05 DIAGNOSIS — D509 Iron deficiency anemia, unspecified: Secondary | ICD-10-CM | POA: Diagnosis not present

## 2019-01-05 DIAGNOSIS — N186 End stage renal disease: Secondary | ICD-10-CM | POA: Diagnosis not present

## 2019-01-05 MED FILL — clonazePAM 0.5 MG TABS: 0.5 | 15 days supply | Qty: 15 | Fill #2

## 2019-01-05 NOTE — Telephone Encounter (Signed)
Called to get PA re-faxed to the office

## 2019-01-05 NOTE — Telephone Encounter (Signed)
Patient's wife called regarding his Vimpat quantity being increased. She said an authorization request was faxed here to the office. She was calling to make sure that it was received? She said he only has one left for today. Please Call. Thanks

## 2019-01-05 NOTE — Telephone Encounter (Signed)
PA started will call pt wife to up date, will know result of PA with in 24 hrs. Called pt wife no answer voice mail left for them to call the office for an update, will call them tomorrow if they do not call back today before 3:30

## 2019-01-06 MED FILL — VIMPAT 150 MG TABLET: 150 | 90 days supply | Qty: 225 | Fill #0

## 2019-01-06 NOTE — Telephone Encounter (Signed)
Informed that PA was started we should know this afternoon if it was approved, she was informed that I would keep her updated

## 2019-01-06 NOTE — Telephone Encounter (Signed)
PA was approved Pharmacy was called as well as pt wife,   Prior authorization number [id number] Plan code [facility] 01/05/2019-01/04/2020

## 2019-01-07 DIAGNOSIS — E1122 Type 2 diabetes mellitus with diabetic chronic kidney disease: Secondary | ICD-10-CM | POA: Diagnosis not present

## 2019-01-07 DIAGNOSIS — D509 Iron deficiency anemia, unspecified: Secondary | ICD-10-CM | POA: Diagnosis not present

## 2019-01-07 DIAGNOSIS — N186 End stage renal disease: Secondary | ICD-10-CM | POA: Diagnosis not present

## 2019-01-07 DIAGNOSIS — D631 Anemia in chronic kidney disease: Secondary | ICD-10-CM | POA: Diagnosis not present

## 2019-01-07 DIAGNOSIS — Z992 Dependence on renal dialysis: Secondary | ICD-10-CM | POA: Diagnosis not present

## 2019-01-07 DIAGNOSIS — N2581 Secondary hyperparathyroidism of renal origin: Secondary | ICD-10-CM | POA: Diagnosis not present

## 2019-01-07 DIAGNOSIS — E1129 Type 2 diabetes mellitus with other diabetic kidney complication: Secondary | ICD-10-CM | POA: Diagnosis not present

## 2019-01-10 DIAGNOSIS — E1129 Type 2 diabetes mellitus with other diabetic kidney complication: Secondary | ICD-10-CM | POA: Diagnosis not present

## 2019-01-10 DIAGNOSIS — N186 End stage renal disease: Secondary | ICD-10-CM | POA: Diagnosis not present

## 2019-01-10 DIAGNOSIS — D631 Anemia in chronic kidney disease: Secondary | ICD-10-CM | POA: Diagnosis not present

## 2019-01-10 DIAGNOSIS — N2581 Secondary hyperparathyroidism of renal origin: Secondary | ICD-10-CM | POA: Diagnosis not present

## 2019-01-10 DIAGNOSIS — D509 Iron deficiency anemia, unspecified: Secondary | ICD-10-CM | POA: Diagnosis not present

## 2019-01-11 ENCOUNTER — Other Ambulatory Visit: Payer: Self-pay

## 2019-01-11 ENCOUNTER — Inpatient Hospital Stay: Payer: Medicare Other | Attending: Oncology

## 2019-01-11 ENCOUNTER — Telehealth: Payer: Self-pay | Admitting: *Deleted

## 2019-01-11 ENCOUNTER — Inpatient Hospital Stay: Payer: Medicare Other

## 2019-01-11 VITALS — BP 141/66 | HR 70 | Temp 99.5°F | Resp 18

## 2019-01-11 DIAGNOSIS — G629 Polyneuropathy, unspecified: Secondary | ICD-10-CM | POA: Diagnosis not present

## 2019-01-11 DIAGNOSIS — Z794 Long term (current) use of insulin: Secondary | ICD-10-CM | POA: Insufficient documentation

## 2019-01-11 DIAGNOSIS — D649 Anemia, unspecified: Secondary | ICD-10-CM | POA: Diagnosis not present

## 2019-01-11 DIAGNOSIS — Z992 Dependence on renal dialysis: Secondary | ICD-10-CM | POA: Diagnosis not present

## 2019-01-11 DIAGNOSIS — C9000 Multiple myeloma not having achieved remission: Secondary | ICD-10-CM | POA: Insufficient documentation

## 2019-01-11 DIAGNOSIS — N186 End stage renal disease: Secondary | ICD-10-CM | POA: Insufficient documentation

## 2019-01-11 DIAGNOSIS — Z5112 Encounter for antineoplastic immunotherapy: Secondary | ICD-10-CM | POA: Insufficient documentation

## 2019-01-11 DIAGNOSIS — E1122 Type 2 diabetes mellitus with diabetic chronic kidney disease: Secondary | ICD-10-CM | POA: Insufficient documentation

## 2019-01-11 LAB — CMP (CANCER CENTER ONLY)
ALT: <6 U/L (ref 0–44)
AST: 11 U/L — ABNORMAL LOW (ref 15–41)
Albumin: 3.6 g/dL (ref 3.5–5.0)
Alkaline Phosphatase: 71 U/L (ref 38–126)
Anion gap: 12 (ref 5–15)
BUN: 28 mg/dL — ABNORMAL HIGH (ref 6–20)
CO2: 30 mmol/L (ref 22–32)
Calcium: 8.7 mg/dL — ABNORMAL LOW (ref 8.9–10.3)
Chloride: 97 mmol/L — ABNORMAL LOW (ref 98–111)
Creatinine: 7.4 mg/dL (ref 0.61–1.24)
GFR, Est AFR Am: 9 mL/min — ABNORMAL LOW (ref >60)
GFR, Estimated: 8 mL/min — ABNORMAL LOW (ref >60)
Glucose, Bld: 165 mg/dL — ABNORMAL HIGH (ref 70–99)
Potassium: 3.8 mmol/L (ref 3.5–5.1)
Sodium: 139 mmol/L (ref 135–145)
Total Bilirubin: 0.8 mg/dL (ref 0.3–1.2)
Total Protein: 6.9 g/dL (ref 6.5–8.1)

## 2019-01-11 LAB — CBC WITH DIFFERENTIAL (CANCER CENTER ONLY)
Abs Immature Granulocytes: 0.03 10*3/uL (ref 0.00–0.07)
Basophils Absolute: 0.1 10*3/uL (ref 0.0–0.1)
Basophils Relative: 2 %
Eosinophils Absolute: 0.5 10*3/uL (ref 0.0–0.5)
Eosinophils Relative: 6 %
HCT: 26.4 % — ABNORMAL LOW (ref 39.0–52.0)
Hemoglobin: 9.7 g/dL — ABNORMAL LOW (ref 13.0–17.0)
Immature Granulocytes: 0 %
Lymphocytes Relative: 0 %
Lymphs Abs: 0 10*3/uL — ABNORMAL LOW (ref 0.7–4.0)
MCH: 31.2 pg (ref 26.0–34.0)
MCHC: 36.7 g/dL — ABNORMAL HIGH (ref 30.0–36.0)
MCV: 84.9 fL (ref 80.0–100.0)
Monocytes Absolute: 0.7 10*3/uL (ref 0.1–1.0)
Monocytes Relative: 9 %
Neutro Abs: 6.8 10*3/uL (ref 1.7–7.7)
Neutrophils Relative %: 83 %
Platelet Count: 202 10*3/uL (ref 150–400)
RBC: 3.11 MIL/uL — ABNORMAL LOW (ref 4.22–5.81)
RDW: 19.9 % — ABNORMAL HIGH (ref 11.5–15.5)
WBC Count: 8.2 10*3/uL (ref 4.0–10.5)
nRBC: 12.2 % — ABNORMAL HIGH (ref 0.0–0.2)

## 2019-01-11 MED ORDER — PROCHLORPERAZINE MALEATE 10 MG PO TABS: 10.0000 mg | ORAL_TABLET | Freq: Once | ORAL | Status: DC

## 2019-01-11 MED ORDER — BORTEZOMIB CHEMO SQ INJECTION 3.5 MG (2.5MG/ML)
1.5000 mg/m2 | Freq: Once | INTRAMUSCULAR | Status: AC
  Administered 2019-01-11: 3.25 mg via SUBCUTANEOUS
  Filled 2019-01-11: qty 1.3

## 2019-01-11 NOTE — Telephone Encounter (Signed)
Received call report from [facility].  "Today's Creat = 7.4."  Routing results to provider.  Dialysis patient.  Infusion scheduled today at 9:15 am.  Further communication through infusion nurse per treatment protocol standards with results.

## 2019-01-11 NOTE — Patient Instructions (Signed)
[place] Discharge Instructions for Patients Receiving Chemotherapy  Today you received the following chemotherapy agents :  Velcade.  To help prevent nausea and vomiting after your treatment, we encourage you to take your nausea medication as prescribed.   If you develop nausea and vomiting that is not controlled by your nausea medication, call the clinic.   BELOW ARE SYMPTOMS THAT SHOULD BE REPORTED IMMEDIATELY:  *FEVER GREATER THAN 100.5 F  *CHILLS WITH OR WITHOUT FEVER  NAUSEA AND VOMITING THAT IS NOT CONTROLLED WITH YOUR NAUSEA MEDICATION  *UNUSUAL SHORTNESS OF BREATH  *UNUSUAL BRUISING OR BLEEDING  TENDERNESS IN MOUTH AND THROAT WITH OR WITHOUT PRESENCE OF ULCERS  *URINARY PROBLEMS  *BOWEL PROBLEMS  UNUSUAL RASH Items with * indicate a potential emergency and should be followed up as soon as possible.  Feel free to call the clinic should you have any questions or concerns. The clinic phone number is [phone].  Please show the [place] at check-in to the Emergency Department and triage nurse.

## 2019-01-12 DIAGNOSIS — D509 Iron deficiency anemia, unspecified: Secondary | ICD-10-CM | POA: Diagnosis not present

## 2019-01-12 DIAGNOSIS — E1129 Type 2 diabetes mellitus with other diabetic kidney complication: Secondary | ICD-10-CM | POA: Diagnosis not present

## 2019-01-12 DIAGNOSIS — N186 End stage renal disease: Secondary | ICD-10-CM | POA: Diagnosis not present

## 2019-01-12 DIAGNOSIS — D631 Anemia in chronic kidney disease: Secondary | ICD-10-CM | POA: Diagnosis not present

## 2019-01-12 DIAGNOSIS — N2581 Secondary hyperparathyroidism of renal origin: Secondary | ICD-10-CM | POA: Diagnosis not present

## 2019-01-12 LAB — MULTIPLE MYELOMA PANEL, SERUM
Albumin SerPl Elph-Mcnc: 3.7 g/dL (ref 2.9–4.4)
Albumin/Glob SerPl: 1.5 (ref 0.7–1.7)
Alpha 1: 0.3 g/dL (ref 0.0–0.4)
Alpha2 Glob SerPl Elph-Mcnc: 0.6 g/dL (ref 0.4–1.0)
B-Globulin SerPl Elph-Mcnc: 0.6 g/dL — ABNORMAL LOW (ref 0.7–1.3)
Gamma Glob SerPl Elph-Mcnc: 1 g/dL (ref 0.4–1.8)
Globulin, Total: 2.5 g/dL (ref 2.2–3.9)
IgA: 34 mg/dL — ABNORMAL LOW (ref 90–386)
IgG (Immunoglobin G), Serum: 1149 mg/dL (ref 603–1613)
IgM (Immunoglobulin M), Srm: 12 mg/dL — ABNORMAL LOW (ref 20–172)
M Protein SerPl Elph-Mcnc: 0.4 g/dL — ABNORMAL HIGH
Total Protein ELP: 6.2 g/dL (ref 6.0–8.5)

## 2019-01-12 LAB — KAPPA/LAMBDA LIGHT CHAINS
Kappa free light chain: 187 mg/L — ABNORMAL HIGH (ref 3.3–19.4)
Kappa, lambda light chain ratio: 2.94 — ABNORMAL HIGH (ref 0.26–1.65)
Lambda free light chains: 63.6 mg/L — ABNORMAL HIGH (ref 5.7–26.3)

## 2019-01-13 ENCOUNTER — Encounter (INDEPENDENT_AMBULATORY_CARE_PROVIDER_SITE_OTHER): Payer: 59 | Admitting: Ophthalmology

## 2019-01-14 ENCOUNTER — Encounter: Payer: Self-pay | Admitting: Internal Medicine

## 2019-01-14 DIAGNOSIS — N186 End stage renal disease: Secondary | ICD-10-CM | POA: Diagnosis not present

## 2019-01-14 DIAGNOSIS — D631 Anemia in chronic kidney disease: Secondary | ICD-10-CM | POA: Diagnosis not present

## 2019-01-14 DIAGNOSIS — D509 Iron deficiency anemia, unspecified: Secondary | ICD-10-CM | POA: Diagnosis not present

## 2019-01-14 DIAGNOSIS — N2581 Secondary hyperparathyroidism of renal origin: Secondary | ICD-10-CM | POA: Diagnosis not present

## 2019-01-14 DIAGNOSIS — E1129 Type 2 diabetes mellitus with other diabetic kidney complication: Secondary | ICD-10-CM | POA: Diagnosis not present

## 2019-01-17 DIAGNOSIS — D631 Anemia in chronic kidney disease: Secondary | ICD-10-CM | POA: Diagnosis not present

## 2019-01-17 DIAGNOSIS — N186 End stage renal disease: Secondary | ICD-10-CM | POA: Diagnosis not present

## 2019-01-17 DIAGNOSIS — D509 Iron deficiency anemia, unspecified: Secondary | ICD-10-CM | POA: Diagnosis not present

## 2019-01-17 DIAGNOSIS — N2581 Secondary hyperparathyroidism of renal origin: Secondary | ICD-10-CM | POA: Diagnosis not present

## 2019-01-17 DIAGNOSIS — E1129 Type 2 diabetes mellitus with other diabetic kidney complication: Secondary | ICD-10-CM | POA: Diagnosis not present

## 2019-01-18 ENCOUNTER — Inpatient Hospital Stay: Payer: Medicare Other

## 2019-01-18 ENCOUNTER — Inpatient Hospital Stay (HOSPITAL_BASED_OUTPATIENT_CLINIC_OR_DEPARTMENT_OTHER): Payer: Medicare Other | Admitting: Oncology

## 2019-01-18 ENCOUNTER — Other Ambulatory Visit: Payer: Self-pay

## 2019-01-18 VITALS — BP 137/74 | HR 75 | Temp 100.1°F | Resp 17 | Ht 64.0 in | Wt 208.2 lb

## 2019-01-18 DIAGNOSIS — G629 Polyneuropathy, unspecified: Secondary | ICD-10-CM | POA: Diagnosis not present

## 2019-01-18 DIAGNOSIS — Z5112 Encounter for antineoplastic immunotherapy: Secondary | ICD-10-CM | POA: Diagnosis not present

## 2019-01-18 DIAGNOSIS — D649 Anemia, unspecified: Secondary | ICD-10-CM

## 2019-01-18 DIAGNOSIS — Z992 Dependence on renal dialysis: Secondary | ICD-10-CM

## 2019-01-18 DIAGNOSIS — E1122 Type 2 diabetes mellitus with diabetic chronic kidney disease: Secondary | ICD-10-CM | POA: Diagnosis not present

## 2019-01-18 DIAGNOSIS — N186 End stage renal disease: Secondary | ICD-10-CM

## 2019-01-18 DIAGNOSIS — C9000 Multiple myeloma not having achieved remission: Secondary | ICD-10-CM | POA: Diagnosis not present

## 2019-01-18 DIAGNOSIS — Z794 Long term (current) use of insulin: Secondary | ICD-10-CM | POA: Diagnosis not present

## 2019-01-18 LAB — CBC WITH DIFFERENTIAL (CANCER CENTER ONLY)
Abs Immature Granulocytes: 0.02 10*3/uL (ref 0.00–0.07)
Basophils Absolute: 0.1 10*3/uL (ref 0.0–0.1)
Basophils Relative: 1 %
Eosinophils Absolute: 0.1 10*3/uL (ref 0.0–0.5)
Eosinophils Relative: 1 %
HCT: 26.4 % — ABNORMAL LOW (ref 39.0–52.0)
Hemoglobin: 9.8 g/dL — ABNORMAL LOW (ref 13.0–17.0)
Immature Granulocytes: 0 %
Lymphocytes Relative: 0 %
Lymphs Abs: 0 10*3/uL — ABNORMAL LOW (ref 0.7–4.0)
MCH: 31.6 pg (ref 26.0–34.0)
MCHC: 37.1 g/dL — ABNORMAL HIGH (ref 30.0–36.0)
MCV: 85.2 fL (ref 80.0–100.0)
Monocytes Absolute: 0.3 10*3/uL (ref 0.1–1.0)
Monocytes Relative: 4 %
Neutro Abs: 6.7 10*3/uL (ref 1.7–7.7)
Neutrophils Relative %: 94 %
Platelet Count: 183 10*3/uL (ref 150–400)
RBC: 3.1 MIL/uL — ABNORMAL LOW (ref 4.22–5.81)
RDW: 19.3 % — ABNORMAL HIGH (ref 11.5–15.5)
WBC Count: 7.1 10*3/uL (ref 4.0–10.5)
nRBC: 10.8 % — ABNORMAL HIGH (ref 0.0–0.2)

## 2019-01-18 LAB — CMP (CANCER CENTER ONLY)
ALT: 8 U/L (ref 0–44)
AST: 11 U/L — ABNORMAL LOW (ref 15–41)
Albumin: 3.7 g/dL (ref 3.5–5.0)
Alkaline Phosphatase: 75 U/L (ref 38–126)
Anion gap: 12 (ref 5–15)
BUN: 24 mg/dL — ABNORMAL HIGH (ref 6–20)
CO2: 32 mmol/L (ref 22–32)
Calcium: 9 mg/dL (ref 8.9–10.3)
Chloride: 94 mmol/L — ABNORMAL LOW (ref 98–111)
Creatinine: 6.95 mg/dL (ref 0.61–1.24)
GFR, Est AFR Am: 9 mL/min — ABNORMAL LOW (ref >60)
GFR, Estimated: 8 mL/min — ABNORMAL LOW (ref >60)
Glucose, Bld: 193 mg/dL — ABNORMAL HIGH (ref 70–99)
Potassium: 4.4 mmol/L (ref 3.5–5.1)
Sodium: 138 mmol/L (ref 135–145)
Total Bilirubin: 0.8 mg/dL (ref 0.3–1.2)
Total Protein: 6.8 g/dL (ref 6.5–8.1)

## 2019-01-18 MED ORDER — PROCHLORPERAZINE MALEATE 10 MG PO TABS
ORAL_TABLET | ORAL | Status: AC
  Filled 2019-01-18: qty 1

## 2019-01-18 MED ORDER — BORTEZOMIB CHEMO SQ INJECTION 3.5 MG (2.5MG/ML)
1.5000 mg/m2 | Freq: Once | INTRAMUSCULAR | Status: AC
  Administered 2019-01-18: 3.25 mg via SUBCUTANEOUS
  Filled 2019-01-18: qty 1.3

## 2019-01-18 MED ORDER — PROCHLORPERAZINE MALEATE 10 MG PO TABS: 10.0000 mg | ORAL_TABLET | Freq: Once | ORAL | Status: DC

## 2019-01-18 NOTE — Patient Instructions (Signed)
[place] Discharge Instructions for Patients Receiving Chemotherapy  Today you received the following chemotherapy agent :  Velcade.  To help prevent nausea and vomiting after your treatment, we encourage you to take your nausea medication as prescribed.   If you develop nausea and vomiting that is not controlled by your nausea medication, call the clinic.   BELOW ARE SYMPTOMS THAT SHOULD BE REPORTED IMMEDIATELY:  *FEVER GREATER THAN 100.5 F  *CHILLS WITH OR WITHOUT FEVER  NAUSEA AND VOMITING THAT IS NOT CONTROLLED WITH YOUR NAUSEA MEDICATION  *UNUSUAL SHORTNESS OF BREATH  *UNUSUAL BRUISING OR BLEEDING  TENDERNESS IN MOUTH AND THROAT WITH OR WITHOUT PRESENCE OF ULCERS  *URINARY PROBLEMS  *BOWEL PROBLEMS  UNUSUAL RASH Items with * indicate a potential emergency and should be followed up as soon as possible.  Feel free to call the clinic should you have any questions or concerns. The clinic phone number is [phone].  Please show the [employer] [place] at check-in to the Emergency Department and triage nurse.

## 2019-01-18 NOTE — Progress Notes (Signed)
Hematology and Oncology Follow Up Visit  [place] [id number] 1966/05/31 53 y.o. 01/18/2019 10:49 AM    Principle Diagnosis: 53 year old man with  1.  Multiple myeloma diagnosed in September 2019.  He presented with 20% plasma involvement and IgG subtype.  He had MGUS dating back to 2012.  2.  Renal failure and dialysis dependent.    Current therapy: Velcade dexamethasone and cyclophosphamide weekly started on 05/25/2018.  Velcade is given at 1.5 mg/m weekly, dexamethasone is 20 mg weekly and and Cytoxan is given at 650 mg total dose.    Interim History:  Mr. Merryfield returns today for a repeat evaluation.  Since the last visit, he reports no major changes in his health.  He tolerates the current therapy without any major complications.  He does report some mild nausea but no vomiting.  His appetite is excellent and he has gained weight.  He denies any worsening neuropathy or excessive fatigue.  He is mobility remains adequate without any recent falls or syncope.   He denied headaches, blurry vision, syncope or seizures.  Denies any fevers, chills or sweats.  Denied chest pain, palpitation, orthopnea or leg edema.  Denied cough, wheezing or hemoptysis.  Denied nausea, vomiting or abdominal pain.  Denies any constipation or diarrhea.  Denies any frequency urgency or hesitancy.  Denies any arthralgias or myalgias.  Denies any skin rashes or lesions.  Denies any bleeding or clotting tendency.  Denies any easy bruising.  Denies any hair or nail changes.  Denies any anxiety or depression.  Remaining review of system is negative.         Medications: I have reviewed the patient's current medications.  Current Outpatient Medications:  .  acyclovir (ZOVIRAX) 400 MG tablet, TAKE 1 TABLET (400 MG TOTAL) BY MOUTH DAILY., Disp: 90 tablet, Rfl: 0 .  albuterol (PROAIR HFA) 108 (90 Base) MCG/ACT inhaler, Inhale two puffs every 4-6 hours if needed for cough or wheeze, Disp: 1 Inhaler, Rfl: 1 .   aspirin EC 81 MG tablet, Take 1 tablet (81 mg total) by mouth daily., Disp: 100 tablet, Rfl: 0 .  BREO ELLIPTA 200-25 MCG/INH AEPB, INHALE 1 PUFF BY MOUTH AT THE SAME TIME EVERY DAY, Disp: 60 each, Rfl: 2 .  calcium acetate (PHOSLO) 667 MG capsule, Take 2,001 mg by mouth 3 (three) times daily with meals. , Disp: , Rfl:  .  Cetirizine HCl 10 MG CAPS, Take 1 capsule by mouth daily., Disp: , Rfl:  .  cinacalcet (SENSIPAR) 60 MG tablet, Take 60 mg by mouth every other day. , Disp: , Rfl:  .  clonazePAM (KLONOPIN) 0.5 MG tablet, TAKE 1 TABLET BY MOUTH PRIOR TO EACH DIALYSIS SESSION, Disp: 45 tablet, Rfl: 3 .  cyclophosphamide (CYTOXAN) 50 MG capsule, TAKE 13 CAPSULES (650MG) BY MOUTH ONCE WEEKLY, TAKE EARLY IN THE DAY, Disp: 52 capsule, Rfl: 3 .  dexamethasone (DECADRON) 4 MG tablet, Take 5 tablets weekly, Disp: 90 tablet, Rfl: 3 .  fluticasone furoate-vilanterol (BREO ELLIPTA) 100-25 MCG/INH AEPB, Inhale one puff once daily to prevent cough or wheeze, Disp: 60 each, Rfl: 5 .  insulin lispro (HUMALOG) 100 UNIT/ML injection, Inject into the skin 3 (three) times daily before meals. 3 to 4 units TID prn SS, Disp: , Rfl:  .  Lacosamide (VIMPAT) 150 MG TABS, TAKE 1 TABLET BY MOUTH TWICE DAILY TAKE AN ADDITIONAL 1 TABLET AFTER DIALYSIS SESSION THREE TIMES A WEEK., Disp: 225 tablet, Rfl: 3 .  lamoTRIgine (LAMICTAL) 150 MG tablet,  Take 1 tablet (150 mg total) by mouth 2 (two) times daily., Disp: 180 tablet, Rfl: 3 .  metoprolol tartrate (LOPRESSOR) 25 MG tablet, Take 0.5 tablets (12.5 mg total) by mouth 2 (two) times daily., Disp: 60 tablet, Rfl: 0 .  midodrine (PROAMATINE) 10 MG tablet, Take 10 mg by mouth 2 (two) times daily with a meal. , Disp: , Rfl:  .  multivitamin (RENA-VIT) TABS tablet, Take 1 tablet by mouth at bedtime., Disp: 30 tablet, Rfl: 1 .  pantoprazole (PROTONIX) 40 MG tablet, Take 1 tablet (40 mg total) by mouth 2 (two) times daily. (Patient taking differently: Take 40 mg by mouth every morning.  ), Disp: 60 tablet, Rfl: 1 .  prochlorperazine (COMPAZINE) 10 MG tablet, TAKE 1 TABLET (10 MG TOTAL) BY MOUTH EVERY 6 (SIX) HOURS AS NEEDED FOR NAUSEA OR VOMITING., Disp: 30 tablet, Rfl: 0  Allergies:  Allergies  Allergen Reactions  . Codeine Rash and Other (See Comments)    Unknown reaction (patient says it was more serious than just a rash, but he can't remember what happened) Unknown reaction (patient says it was more serious than just a rash, but he can't remember what happened)    Past Medical History, Surgical history, Social history, and Family History were reviewed and updated.  Physical Exam:  Blood pressure 137/74, pulse 75, temperature 100.1 F (37.8 C), temperature source Oral, resp. rate 17, height 5' 4"  (1.626 m), weight 208 lb 3.2 oz (94.4 kg), SpO2 96 %.    ECOG: 1   General appearance: Comfortable appearing without any discomfort Head: Normocephalic without any trauma Oropharynx: Mucous membranes are moist and pink without any thrush or ulcers. Eyes: Pupils are equal and round reactive to light. Lymph nodes: No cervical, supraclavicular, inguinal or axillary lymphadenopathy.   Heart:regular rate and rhythm.  S1 and S2 without leg edema. Lung: Clear without any rhonchi or wheezes.  No dullness to percussion. Abdomin: Soft, nontender, nondistended with good bowel sounds.  No hepatosplenomegaly. Musculoskeletal: No joint deformity or effusion.  Full range of motion noted. Neurological: No deficits noted on motor, sensory and deep tendon reflex exam. Skin: No petechial rash or dryness.  Appeared moist.            Lab Results: Lab Results  Component Value Date   WBC 8.2 01/11/2019   HGB 9.7 (L) 01/11/2019   HCT 26.4 (L) 01/11/2019   MCV 84.9 01/11/2019   PLT 202 01/11/2019     Chemistry      Component Value Date/Time   NA 139 01/11/2019 0859   NA 138 07/03/2017 0813   K 3.8 01/11/2019 0859   K 4.2 07/03/2017 0813   CL 97 (L) 01/11/2019 0859    CO2 30 01/11/2019 0859   CO2 27 07/03/2017 0813   BUN 28 (H) 01/11/2019 0859   BUN 46.4 (H) 07/03/2017 0813   CREATININE 7.40 (HH) 01/11/2019 0859   CREATININE 10.9 (HH) 07/03/2017 0813      Component Value Date/Time   CALCIUM 8.7 (L) 01/11/2019 0859   CALCIUM 9.3 07/03/2017 0813   ALKPHOS 71 01/11/2019 0859   ALKPHOS 77 07/03/2017 0813   AST 11 (L) 01/11/2019 0859   AST 16 07/03/2017 0813   ALT <6 01/11/2019 0859   ALT 14 07/03/2017 0813   BILITOT 0.8 01/11/2019 0859   BILITOT 0.93 07/03/2017 0813      Results for PERNELL, LENOIR (MRN [id number]) as of 01/18/2019 10:50  Ref. Range 10/05/2018 13:10 11/02/2018 11:03 12/07/2018 08:21  01/11/2019 08:58  M Protein SerPl Elph-Mcnc Latest Ref Range: Not Observed g/dL 0.9 (H) 0.6 (H) 0.6 (H) 0.4 (H)    Results for AKUL, LEGGETTE (MRN [id number]) as of 01/18/2019 10:50  Ref. Range 01/11/2019 08:59  Kappa free light chain Latest Ref Range: 3.3 - 19.4 mg/L 187.0 (H)  Lamda free light chains Latest Ref Range: 5.7 - 26.3 mg/L 63.6 (H)  Kappa, lamda light chain ratio Latest Ref Range: 0.26 - 1.65  2.94 (H)     Impression and Plan:  53 year old man with:  1.  Multiple myeloma, IgG kappa subtype diagnosed in 2019.     He has tolerated therapy utilizing Velcade, dexamethasone and Cytoxan without any major complications.  Protein studies on Jan 11, 2019 continues to show positive response to therapy with M spike down to 0.4 g/dL.  IgG level is back to normal.  Risks and benefits of continuing this approach long-term was discussed today and he is agreeable to continue.  He continues to follow [facility] for evaluation for high-dose chemotherapy and stem cell transplant at some point.     2. Anemia: Hemoglobin remained stable without any need for transfusion.  3. Renal failure: he is dialysis dependent and no issues with receiving dialysis while on chemotherapy.  4.  Zoster prophylaxis: He remains on acyclovir without any  reactivation.  5.  Diabetes: His blood sugar appears adequate on dexamethasone without any changes.  6.  Nausea prophylaxis: No issues reported with nausea or vomiting.  7.  Goals of care: Therapy remains palliative although aggressive therapy is warranted given his good performance status.   8. Follow-up: He will continue to receive weekly chemotherapy and MD follow-up in 4 weeks.  25  minutes was spent with the patient face-to-face today.  More than 50% of time was dedicated to reviewing his laboratory data, disease status update and answer question regarding future plan of care.     Zola Button, MD 5/12/202010:49 AM

## 2019-01-19 DIAGNOSIS — D509 Iron deficiency anemia, unspecified: Secondary | ICD-10-CM | POA: Diagnosis not present

## 2019-01-19 DIAGNOSIS — E1129 Type 2 diabetes mellitus with other diabetic kidney complication: Secondary | ICD-10-CM | POA: Diagnosis not present

## 2019-01-19 DIAGNOSIS — N186 End stage renal disease: Secondary | ICD-10-CM | POA: Diagnosis not present

## 2019-01-19 DIAGNOSIS — N2581 Secondary hyperparathyroidism of renal origin: Secondary | ICD-10-CM | POA: Diagnosis not present

## 2019-01-19 DIAGNOSIS — D631 Anemia in chronic kidney disease: Secondary | ICD-10-CM | POA: Diagnosis not present

## 2019-01-20 ENCOUNTER — Encounter (INDEPENDENT_AMBULATORY_CARE_PROVIDER_SITE_OTHER): Payer: Medicare Other | Admitting: Ophthalmology

## 2019-01-20 ENCOUNTER — Other Ambulatory Visit: Payer: Self-pay

## 2019-01-20 ENCOUNTER — Telehealth: Payer: Self-pay | Admitting: Internal Medicine

## 2019-01-20 DIAGNOSIS — I10 Essential (primary) hypertension: Secondary | ICD-10-CM | POA: Diagnosis not present

## 2019-01-20 DIAGNOSIS — H43812 Vitreous degeneration, left eye: Secondary | ICD-10-CM

## 2019-01-20 DIAGNOSIS — H35033 Hypertensive retinopathy, bilateral: Secondary | ICD-10-CM

## 2019-01-20 DIAGNOSIS — H524 Presbyopia: Secondary | ICD-10-CM | POA: Diagnosis not present

## 2019-01-20 DIAGNOSIS — H52223 Regular astigmatism, bilateral: Secondary | ICD-10-CM | POA: Diagnosis not present

## 2019-01-20 DIAGNOSIS — H4312 Vitreous hemorrhage, left eye: Secondary | ICD-10-CM | POA: Diagnosis not present

## 2019-01-20 DIAGNOSIS — H3523 Other non-diabetic proliferative retinopathy, bilateral: Secondary | ICD-10-CM | POA: Diagnosis not present

## 2019-01-20 DIAGNOSIS — H5201 Hypermetropia, right eye: Secondary | ICD-10-CM | POA: Diagnosis not present

## 2019-01-20 DIAGNOSIS — H25813 Combined forms of age-related cataract, bilateral: Secondary | ICD-10-CM | POA: Diagnosis not present

## 2019-01-20 DIAGNOSIS — E119 Type 2 diabetes mellitus without complications: Secondary | ICD-10-CM | POA: Diagnosis not present

## 2019-01-20 DIAGNOSIS — H2513 Age-related nuclear cataract, bilateral: Secondary | ICD-10-CM

## 2019-01-20 DIAGNOSIS — H5212 Myopia, left eye: Secondary | ICD-10-CM | POA: Diagnosis not present

## 2019-01-20 MED FILL — PREDNISOLONE AC 1% EYE DROP: 1 | 14 days supply | Qty: 5 | Fill #0

## 2019-01-20 NOTE — Telephone Encounter (Signed)
I spoke with the patient's wife, and she agreed to come at 10:45am on 03/17/2019 instead of 11:45. VDM (DD)

## 2019-01-21 DIAGNOSIS — D509 Iron deficiency anemia, unspecified: Secondary | ICD-10-CM | POA: Diagnosis not present

## 2019-01-21 DIAGNOSIS — E1129 Type 2 diabetes mellitus with other diabetic kidney complication: Secondary | ICD-10-CM | POA: Diagnosis not present

## 2019-01-21 DIAGNOSIS — N186 End stage renal disease: Secondary | ICD-10-CM | POA: Diagnosis not present

## 2019-01-21 DIAGNOSIS — N2581 Secondary hyperparathyroidism of renal origin: Secondary | ICD-10-CM | POA: Diagnosis not present

## 2019-01-21 DIAGNOSIS — D631 Anemia in chronic kidney disease: Secondary | ICD-10-CM | POA: Diagnosis not present

## 2019-01-21 LAB — BASIC METABOLIC PANEL: Creatinine: 9 — AB (ref 0.6–1.3)

## 2019-01-24 ENCOUNTER — Other Ambulatory Visit: Payer: Self-pay | Admitting: Oncology

## 2019-01-24 ENCOUNTER — Other Ambulatory Visit: Payer: Self-pay | Admitting: Internal Medicine

## 2019-01-24 DIAGNOSIS — D631 Anemia in chronic kidney disease: Secondary | ICD-10-CM | POA: Diagnosis not present

## 2019-01-24 DIAGNOSIS — E1129 Type 2 diabetes mellitus with other diabetic kidney complication: Secondary | ICD-10-CM | POA: Diagnosis not present

## 2019-01-24 DIAGNOSIS — N186 End stage renal disease: Secondary | ICD-10-CM | POA: Diagnosis not present

## 2019-01-24 DIAGNOSIS — D509 Iron deficiency anemia, unspecified: Secondary | ICD-10-CM | POA: Diagnosis not present

## 2019-01-24 DIAGNOSIS — N2581 Secondary hyperparathyroidism of renal origin: Secondary | ICD-10-CM | POA: Diagnosis not present

## 2019-01-25 ENCOUNTER — Inpatient Hospital Stay: Payer: Medicare Other

## 2019-01-25 ENCOUNTER — Telehealth: Payer: Self-pay

## 2019-01-25 ENCOUNTER — Other Ambulatory Visit: Payer: Self-pay

## 2019-01-25 VITALS — BP 140/70 | HR 68 | Temp 98.5°F | Resp 18

## 2019-01-25 DIAGNOSIS — C9000 Multiple myeloma not having achieved remission: Secondary | ICD-10-CM

## 2019-01-25 DIAGNOSIS — Z794 Long term (current) use of insulin: Secondary | ICD-10-CM | POA: Diagnosis not present

## 2019-01-25 DIAGNOSIS — G629 Polyneuropathy, unspecified: Secondary | ICD-10-CM | POA: Diagnosis not present

## 2019-01-25 DIAGNOSIS — N186 End stage renal disease: Secondary | ICD-10-CM | POA: Diagnosis not present

## 2019-01-25 DIAGNOSIS — E1122 Type 2 diabetes mellitus with diabetic chronic kidney disease: Secondary | ICD-10-CM | POA: Diagnosis not present

## 2019-01-25 DIAGNOSIS — Z5112 Encounter for antineoplastic immunotherapy: Secondary | ICD-10-CM | POA: Diagnosis not present

## 2019-01-25 DIAGNOSIS — D649 Anemia, unspecified: Secondary | ICD-10-CM | POA: Diagnosis not present

## 2019-01-25 DIAGNOSIS — Z992 Dependence on renal dialysis: Secondary | ICD-10-CM | POA: Diagnosis not present

## 2019-01-25 LAB — CBC WITH DIFFERENTIAL (CANCER CENTER ONLY)
Abs Immature Granulocytes: 0.01 10*3/uL (ref 0.00–0.07)
Basophils Absolute: 0.1 10*3/uL (ref 0.0–0.1)
Basophils Relative: 2 %
Eosinophils Absolute: 0.6 10*3/uL — ABNORMAL HIGH (ref 0.0–0.5)
Eosinophils Relative: 8 %
HCT: 24.7 % — ABNORMAL LOW (ref 39.0–52.0)
Hemoglobin: 9.3 g/dL — ABNORMAL LOW (ref 13.0–17.0)
Immature Granulocytes: 0 %
Lymphocytes Relative: 0 %
Lymphs Abs: 0 10*3/uL — ABNORMAL LOW (ref 0.7–4.0)
MCH: 31.7 pg (ref 26.0–34.0)
MCHC: 37.7 g/dL — ABNORMAL HIGH (ref 30.0–36.0)
MCV: 84.3 fL (ref 80.0–100.0)
Monocytes Absolute: 1.2 10*3/uL — ABNORMAL HIGH (ref 0.1–1.0)
Monocytes Relative: 18 %
Neutro Abs: 4.8 10*3/uL (ref 1.7–7.7)
Neutrophils Relative %: 72 %
Platelet Count: 173 10*3/uL (ref 150–400)
RBC: 2.93 MIL/uL — ABNORMAL LOW (ref 4.22–5.81)
RDW: 18.9 % — ABNORMAL HIGH (ref 11.5–15.5)
WBC Count: 6.7 10*3/uL (ref 4.0–10.5)
nRBC: 7.3 % — ABNORMAL HIGH (ref 0.0–0.2)

## 2019-01-25 LAB — CMP (CANCER CENTER ONLY)
ALT: <6 U/L (ref 0–44)
AST: 11 U/L — ABNORMAL LOW (ref 15–41)
Albumin: 3.4 g/dL — ABNORMAL LOW (ref 3.5–5.0)
Alkaline Phosphatase: 69 U/L (ref 38–126)
Anion gap: 11 (ref 5–15)
BUN: 23 mg/dL — ABNORMAL HIGH (ref 6–20)
CO2: 33 mmol/L — ABNORMAL HIGH (ref 22–32)
Calcium: 8.4 mg/dL — ABNORMAL LOW (ref 8.9–10.3)
Chloride: 95 mmol/L — ABNORMAL LOW (ref 98–111)
Creatinine: 7.07 mg/dL (ref 0.61–1.24)
GFR, Est AFR Am: 9 mL/min — ABNORMAL LOW (ref >60)
GFR, Estimated: 8 mL/min — ABNORMAL LOW (ref >60)
Glucose, Bld: 140 mg/dL — ABNORMAL HIGH (ref 70–99)
Potassium: 3.9 mmol/L (ref 3.5–5.1)
Sodium: 139 mmol/L (ref 135–145)
Total Bilirubin: 0.8 mg/dL (ref 0.3–1.2)
Total Protein: 6.5 g/dL (ref 6.5–8.1)

## 2019-01-25 LAB — BASIC METABOLIC PANEL: BUN: 42 — AB (ref 4–21)

## 2019-01-25 MED ORDER — BORTEZOMIB CHEMO SQ INJECTION 3.5 MG (2.5MG/ML)
1.5000 mg/m2 | Freq: Once | INTRAMUSCULAR | Status: AC
  Administered 2019-01-25: 3.25 mg via SUBCUTANEOUS
  Filled 2019-01-25: qty 1.3

## 2019-01-25 MED ORDER — PROCHLORPERAZINE MALEATE 10 MG PO TABS: 10.0000 mg | ORAL_TABLET | Freq: Once | ORAL | Status: DC

## 2019-01-25 NOTE — Patient Instructions (Signed)
[place] Discharge Instructions for Patients Receiving Chemotherapy  Today you received the following chemotherapy agent :  Velcade.  To help prevent nausea and vomiting after your treatment, we encourage you to take your nausea medication as prescribed.   If you develop nausea and vomiting that is not controlled by your nausea medication, call the clinic.   BELOW ARE SYMPTOMS THAT SHOULD BE REPORTED IMMEDIATELY:  *FEVER GREATER THAN 100.5 F  *CHILLS WITH OR WITHOUT FEVER  NAUSEA AND VOMITING THAT IS NOT CONTROLLED WITH YOUR NAUSEA MEDICATION  *UNUSUAL SHORTNESS OF BREATH  *UNUSUAL BRUISING OR BLEEDING  TENDERNESS IN MOUTH AND THROAT WITH OR WITHOUT PRESENCE OF ULCERS  *URINARY PROBLEMS  *BOWEL PROBLEMS  UNUSUAL RASH Items with * indicate a potential emergency and should be followed up as soon as possible.  Feel free to call the clinic should you have any questions or concerns. The clinic phone number is [phone].  Please show the [place] at check-in to the Emergency Department and triage nurse.

## 2019-01-25 NOTE — Telephone Encounter (Signed)
Dr. Alen Blew made aware of critical lab result call from lab. Creatinine 7.07.

## 2019-01-26 ENCOUNTER — Encounter: Payer: Self-pay | Admitting: Internal Medicine

## 2019-01-26 DIAGNOSIS — D509 Iron deficiency anemia, unspecified: Secondary | ICD-10-CM | POA: Diagnosis not present

## 2019-01-26 DIAGNOSIS — E1129 Type 2 diabetes mellitus with other diabetic kidney complication: Secondary | ICD-10-CM | POA: Diagnosis not present

## 2019-01-26 DIAGNOSIS — D631 Anemia in chronic kidney disease: Secondary | ICD-10-CM | POA: Diagnosis not present

## 2019-01-26 DIAGNOSIS — N2581 Secondary hyperparathyroidism of renal origin: Secondary | ICD-10-CM | POA: Diagnosis not present

## 2019-01-26 DIAGNOSIS — N186 End stage renal disease: Secondary | ICD-10-CM | POA: Diagnosis not present

## 2019-01-27 DIAGNOSIS — E1065 Type 1 diabetes mellitus with hyperglycemia: Secondary | ICD-10-CM | POA: Diagnosis not present

## 2019-01-27 MED FILL — ACYCLOVIR 400 MG TABLET: 400 | 90 days supply | Qty: 90 | Fill #0

## 2019-01-27 MED FILL — METOPROLOL TARTRATE 25 MG T: 25 | 90 days supply | Qty: 90 | Fill #2

## 2019-01-27 MED FILL — CYCLOPHOSPHAMIDE 50 MG CAPS: 50 | 28 days supply | Qty: 52 | Fill #1

## 2019-01-27 MED FILL — PANTOPRAZOLE SOD DR 40 MG T: 40 | 90 days supply | Qty: 90 | Fill #0

## 2019-01-27 MED FILL — BREO ELLIPTA 200-25 MCG INH: 200-25 | 30 days supply | Qty: 60 | Fill #1

## 2019-01-28 DIAGNOSIS — N2581 Secondary hyperparathyroidism of renal origin: Secondary | ICD-10-CM | POA: Diagnosis not present

## 2019-01-28 DIAGNOSIS — D509 Iron deficiency anemia, unspecified: Secondary | ICD-10-CM | POA: Diagnosis not present

## 2019-01-28 DIAGNOSIS — N186 End stage renal disease: Secondary | ICD-10-CM | POA: Diagnosis not present

## 2019-01-28 DIAGNOSIS — D631 Anemia in chronic kidney disease: Secondary | ICD-10-CM | POA: Diagnosis not present

## 2019-01-28 DIAGNOSIS — E1129 Type 2 diabetes mellitus with other diabetic kidney complication: Secondary | ICD-10-CM | POA: Diagnosis not present

## 2019-01-31 DIAGNOSIS — N2581 Secondary hyperparathyroidism of renal origin: Secondary | ICD-10-CM | POA: Diagnosis not present

## 2019-01-31 DIAGNOSIS — N186 End stage renal disease: Secondary | ICD-10-CM | POA: Diagnosis not present

## 2019-01-31 DIAGNOSIS — E1129 Type 2 diabetes mellitus with other diabetic kidney complication: Secondary | ICD-10-CM | POA: Diagnosis not present

## 2019-01-31 DIAGNOSIS — D631 Anemia in chronic kidney disease: Secondary | ICD-10-CM | POA: Diagnosis not present

## 2019-01-31 DIAGNOSIS — D509 Iron deficiency anemia, unspecified: Secondary | ICD-10-CM | POA: Diagnosis not present

## 2019-02-01 ENCOUNTER — Other Ambulatory Visit: Payer: Self-pay

## 2019-02-01 ENCOUNTER — Inpatient Hospital Stay: Payer: Medicare Other

## 2019-02-01 ENCOUNTER — Telehealth: Payer: Self-pay | Admitting: *Deleted

## 2019-02-01 VITALS — BP 146/69 | HR 76 | Temp 98.7°F | Resp 16 | Ht 65.0 in | Wt 207.5 lb

## 2019-02-01 DIAGNOSIS — N186 End stage renal disease: Secondary | ICD-10-CM | POA: Diagnosis not present

## 2019-02-01 DIAGNOSIS — G629 Polyneuropathy, unspecified: Secondary | ICD-10-CM | POA: Diagnosis not present

## 2019-02-01 DIAGNOSIS — D649 Anemia, unspecified: Secondary | ICD-10-CM | POA: Diagnosis not present

## 2019-02-01 DIAGNOSIS — Z992 Dependence on renal dialysis: Secondary | ICD-10-CM | POA: Diagnosis not present

## 2019-02-01 DIAGNOSIS — E1122 Type 2 diabetes mellitus with diabetic chronic kidney disease: Secondary | ICD-10-CM | POA: Diagnosis not present

## 2019-02-01 DIAGNOSIS — C9000 Multiple myeloma not having achieved remission: Secondary | ICD-10-CM

## 2019-02-01 DIAGNOSIS — Z794 Long term (current) use of insulin: Secondary | ICD-10-CM | POA: Diagnosis not present

## 2019-02-01 DIAGNOSIS — Z5112 Encounter for antineoplastic immunotherapy: Secondary | ICD-10-CM | POA: Diagnosis not present

## 2019-02-01 LAB — CMP (CANCER CENTER ONLY)
ALT: 7 U/L (ref 0–44)
AST: 13 U/L — ABNORMAL LOW (ref 15–41)
Albumin: 3.5 g/dL (ref 3.5–5.0)
Alkaline Phosphatase: 73 U/L (ref 38–126)
Anion gap: 11 (ref 5–15)
BUN: 23 mg/dL — ABNORMAL HIGH (ref 6–20)
CO2: 33 mmol/L — ABNORMAL HIGH (ref 22–32)
Calcium: 8.5 mg/dL — ABNORMAL LOW (ref 8.9–10.3)
Chloride: 95 mmol/L — ABNORMAL LOW (ref 98–111)
Creatinine: 6.94 mg/dL (ref 0.61–1.24)
GFR, Est AFR Am: 9 mL/min — ABNORMAL LOW (ref >60)
GFR, Estimated: 8 mL/min — ABNORMAL LOW (ref >60)
Glucose, Bld: 133 mg/dL — ABNORMAL HIGH (ref 70–99)
Potassium: 4.2 mmol/L (ref 3.5–5.1)
Sodium: 139 mmol/L (ref 135–145)
Total Bilirubin: 0.7 mg/dL (ref 0.3–1.2)
Total Protein: 6.8 g/dL (ref 6.5–8.1)

## 2019-02-01 LAB — CBC WITH DIFFERENTIAL (CANCER CENTER ONLY)
Abs Immature Granulocytes: 0.01 10*3/uL (ref 0.00–0.07)
Basophils Absolute: 0.1 10*3/uL (ref 0.0–0.1)
Basophils Relative: 2 %
Eosinophils Absolute: 0.6 10*3/uL — ABNORMAL HIGH (ref 0.0–0.5)
Eosinophils Relative: 10 %
HCT: 26.8 % — ABNORMAL LOW (ref 39.0–52.0)
Hemoglobin: 9.8 g/dL — ABNORMAL LOW (ref 13.0–17.0)
Immature Granulocytes: 0 %
Lymphocytes Relative: 0 %
Lymphs Abs: 0 10*3/uL — ABNORMAL LOW (ref 0.7–4.0)
MCH: 31.8 pg (ref 26.0–34.0)
MCHC: 36.6 g/dL — ABNORMAL HIGH (ref 30.0–36.0)
MCV: 87 fL (ref 80.0–100.0)
Monocytes Absolute: 1.1 10*3/uL — ABNORMAL HIGH (ref 0.1–1.0)
Monocytes Relative: 18 %
Neutro Abs: 4.1 10*3/uL (ref 1.7–7.7)
Neutrophils Relative %: 70 %
Platelet Count: 185 10*3/uL (ref 150–400)
RBC: 3.08 MIL/uL — ABNORMAL LOW (ref 4.22–5.81)
RDW: 19 % — ABNORMAL HIGH (ref 11.5–15.5)
WBC Count: 5.8 10*3/uL (ref 4.0–10.5)
nRBC: 13.7 % — ABNORMAL HIGH (ref 0.0–0.2)

## 2019-02-01 MED ORDER — BORTEZOMIB CHEMO SQ INJECTION 3.5 MG (2.5MG/ML)
1.5000 mg/m2 | Freq: Once | INTRAMUSCULAR | Status: AC
  Administered 2019-02-01: 3.25 mg via SUBCUTANEOUS
  Filled 2019-02-01: qty 1.3

## 2019-02-01 MED ORDER — PROCHLORPERAZINE MALEATE 10 MG PO TABS: 10.0000 mg | ORAL_TABLET | Freq: Once | ORAL | Status: DC

## 2019-02-01 NOTE — Patient Instructions (Addendum)
[place] Discharge Instructions for Patients Receiving Chemotherapy  Today you received the following chemotherapy agents: Bortezomib (VELCADE).  To help prevent nausea and vomiting after your treatment, we encourage you to take your nausea medication as prescribed.   If you develop nausea and vomiting that is not controlled by your nausea medication, call the clinic.   BELOW ARE SYMPTOMS THAT SHOULD BE REPORTED IMMEDIATELY:  *FEVER GREATER THAN 100.5 F  *CHILLS WITH OR WITHOUT FEVER  NAUSEA AND VOMITING THAT IS NOT CONTROLLED WITH YOUR NAUSEA MEDICATION  *UNUSUAL SHORTNESS OF BREATH  *UNUSUAL BRUISING OR BLEEDING  TENDERNESS IN MOUTH AND THROAT WITH OR WITHOUT PRESENCE OF ULCERS  *URINARY PROBLEMS  *BOWEL PROBLEMS  UNUSUAL RASH Items with * indicate a potential emergency and should be followed up as soon as possible.  Feel free to call the clinic should you have any questions or concerns. The clinic phone number is [phone].  Please show the [place] at check-in to the Emergency Department and triage nurse.   Coronavirus (COVID-19) Are you at risk?  Are you at risk for the Coronavirus (COVID-19)?  To be considered HIGH RISK for Coronavirus (COVID-19), you have to meet the following criteria:  . Traveled to [place], [place], [place], [place] or [place]; or in the [place] to [place], [place], [place], or [place]; and have fever, cough, and shortness of breath within the last 2 weeks of travel OR . Been in close contact with a person diagnosed with COVID-19 within the last 2 weeks and have fever, cough, and shortness of breath . IF YOU DO NOT MEET THESE CRITERIA, YOU ARE CONSIDERED LOW RISK FOR COVID-19.  What to do if you are HIGH RISK for COVID-19?  [name] If you are having a medical emergency, call 911. . Seek medical care right away. Before you go to a doctor's office, urgent care or emergency department, call ahead and tell them  about your recent travel, contact with someone diagnosed with COVID-19, and your symptoms. You should receive instructions from your physician's office regarding next steps of care.  . When you arrive at healthcare provider, tell the healthcare staff immediately you have returned from visiting [place], [place], [place], [place] or [place]; or traveled in the [place] to [place], [place], [place], or [place]; in the last two weeks or you have been in close contact with a person diagnosed with COVID-19 in the last 2 weeks.   . Tell the health care staff about your symptoms: fever, cough and shortness of breath. . After you have been seen by a medical provider, you will be either: o Tested for (COVID-19) and discharged home on quarantine except to seek medical care if symptoms worsen, and asked to  - Stay home and avoid contact with others until you get your results (4-5 days)  - Avoid travel on public transportation if possible (such as bus, train, or airplane) or o Sent to the Emergency Department by EMS for evaluation, COVID-19 testing, and possible admission depending on your condition and test results.  What to do if you are LOW RISK for COVID-19?  Reduce your risk of any infection by using the same precautions used for avoiding the common cold or flu:  [name] Wash your hands often with soap and warm water for at least 20 seconds.  If soap and water are not readily available, use an alcohol-based hand sanitizer with at least 60% alcohol.  . If coughing  or sneezing, cover your mouth and nose by coughing or sneezing into the elbow areas of your shirt or coat, into a tissue or into your sleeve (not your hands). . Avoid shaking hands with others and consider head nods or verbal greetings only. . Avoid touching your eyes, nose, or mouth with unwashed hands.  . Avoid close contact with people who are sick. . Avoid places or events with large numbers of people in one location, like concerts or  sporting events. . Carefully consider travel plans you have or are making. . If you are planning any travel outside or inside the [place], visit the CDC's Travelers' Health webpage for the latest health notices. . If you have some symptoms but not all symptoms, continue to monitor at home and seek medical attention if your symptoms worsen. . If you are having a medical emergency, call 911.   [place] / e-Visit: [email or web address]         [facility]: [facility]: [phone]                   [name]: [phone]

## 2019-02-01 NOTE — Telephone Encounter (Signed)
Received call report from [place] [facility].  "Today's Creat = 6.4."  Further communication through infusion nurse per treatment protocol standards.  Noted [facility] currently receives dialysis.

## 2019-02-02 ENCOUNTER — Other Ambulatory Visit: Payer: Self-pay | Admitting: Pharmacist

## 2019-02-02 DIAGNOSIS — N2581 Secondary hyperparathyroidism of renal origin: Secondary | ICD-10-CM | POA: Diagnosis not present

## 2019-02-02 DIAGNOSIS — E1129 Type 2 diabetes mellitus with other diabetic kidney complication: Secondary | ICD-10-CM | POA: Diagnosis not present

## 2019-02-02 DIAGNOSIS — D509 Iron deficiency anemia, unspecified: Secondary | ICD-10-CM | POA: Diagnosis not present

## 2019-02-02 DIAGNOSIS — N186 End stage renal disease: Secondary | ICD-10-CM | POA: Diagnosis not present

## 2019-02-02 DIAGNOSIS — D631 Anemia in chronic kidney disease: Secondary | ICD-10-CM | POA: Diagnosis not present

## 2019-02-04 DIAGNOSIS — E1129 Type 2 diabetes mellitus with other diabetic kidney complication: Secondary | ICD-10-CM | POA: Diagnosis not present

## 2019-02-04 DIAGNOSIS — N186 End stage renal disease: Secondary | ICD-10-CM | POA: Diagnosis not present

## 2019-02-04 DIAGNOSIS — D509 Iron deficiency anemia, unspecified: Secondary | ICD-10-CM | POA: Diagnosis not present

## 2019-02-04 DIAGNOSIS — D631 Anemia in chronic kidney disease: Secondary | ICD-10-CM | POA: Diagnosis not present

## 2019-02-04 DIAGNOSIS — N2581 Secondary hyperparathyroidism of renal origin: Secondary | ICD-10-CM | POA: Diagnosis not present

## 2019-02-07 DIAGNOSIS — N186 End stage renal disease: Secondary | ICD-10-CM | POA: Diagnosis not present

## 2019-02-07 DIAGNOSIS — D631 Anemia in chronic kidney disease: Secondary | ICD-10-CM | POA: Diagnosis not present

## 2019-02-07 DIAGNOSIS — E1122 Type 2 diabetes mellitus with diabetic chronic kidney disease: Secondary | ICD-10-CM | POA: Diagnosis not present

## 2019-02-07 DIAGNOSIS — C9000 Multiple myeloma not having achieved remission: Secondary | ICD-10-CM | POA: Diagnosis not present

## 2019-02-07 DIAGNOSIS — E1129 Type 2 diabetes mellitus with other diabetic kidney complication: Secondary | ICD-10-CM | POA: Diagnosis not present

## 2019-02-07 DIAGNOSIS — Z5112 Encounter for antineoplastic immunotherapy: Secondary | ICD-10-CM | POA: Diagnosis not present

## 2019-02-07 DIAGNOSIS — D509 Iron deficiency anemia, unspecified: Secondary | ICD-10-CM | POA: Diagnosis not present

## 2019-02-07 DIAGNOSIS — Z992 Dependence on renal dialysis: Secondary | ICD-10-CM | POA: Diagnosis not present

## 2019-02-07 DIAGNOSIS — N2581 Secondary hyperparathyroidism of renal origin: Secondary | ICD-10-CM | POA: Diagnosis not present

## 2019-02-07 MED FILL — clonazePAM 0.5 MG TABS: 0.5 | 90 days supply | Qty: 45 | Fill #0

## 2019-02-08 ENCOUNTER — Telehealth: Payer: Self-pay | Admitting: *Deleted

## 2019-02-08 ENCOUNTER — Inpatient Hospital Stay: Payer: Medicare Other

## 2019-02-08 ENCOUNTER — Inpatient Hospital Stay: Payer: Medicare Other | Attending: Oncology

## 2019-02-08 ENCOUNTER — Other Ambulatory Visit: Payer: Self-pay

## 2019-02-08 VITALS — BP 130/65 | HR 73 | Temp 98.5°F | Resp 20

## 2019-02-08 DIAGNOSIS — C9000 Multiple myeloma not having achieved remission: Secondary | ICD-10-CM | POA: Insufficient documentation

## 2019-02-08 DIAGNOSIS — Z5112 Encounter for antineoplastic immunotherapy: Secondary | ICD-10-CM | POA: Insufficient documentation

## 2019-02-08 LAB — CBC WITH DIFFERENTIAL (CANCER CENTER ONLY)
Abs Immature Granulocytes: 0.01 10*3/uL (ref 0.00–0.07)
Basophils Absolute: 0.1 10*3/uL (ref 0.0–0.1)
Basophils Relative: 2 %
Eosinophils Absolute: 0.6 10*3/uL — ABNORMAL HIGH (ref 0.0–0.5)
Eosinophils Relative: 10 %
HCT: 25.9 % — ABNORMAL LOW (ref 39.0–52.0)
Hemoglobin: 9.6 g/dL — ABNORMAL LOW (ref 13.0–17.0)
Immature Granulocytes: 0 %
Lymphocytes Relative: 0 %
Lymphs Abs: 0 10*3/uL — ABNORMAL LOW (ref 0.7–4.0)
MCH: 32.2 pg (ref 26.0–34.0)
MCHC: 37.1 g/dL — ABNORMAL HIGH (ref 30.0–36.0)
MCV: 86.9 fL (ref 80.0–100.0)
Monocytes Absolute: 1.2 10*3/uL — ABNORMAL HIGH (ref 0.1–1.0)
Monocytes Relative: 18 %
Neutro Abs: 4.5 10*3/uL (ref 1.7–7.7)
Neutrophils Relative %: 70 %
Platelet Count: 169 10*3/uL (ref 150–400)
RBC: 2.98 MIL/uL — ABNORMAL LOW (ref 4.22–5.81)
RDW: 18.8 % — ABNORMAL HIGH (ref 11.5–15.5)
WBC Count: 6.4 10*3/uL (ref 4.0–10.5)
nRBC: 9.3 % — ABNORMAL HIGH (ref 0.0–0.2)

## 2019-02-08 LAB — CMP (CANCER CENTER ONLY)
ALT: <6 U/L (ref 0–44)
AST: 11 U/L — ABNORMAL LOW (ref 15–41)
Albumin: 3.4 g/dL — ABNORMAL LOW (ref 3.5–5.0)
Alkaline Phosphatase: 69 U/L (ref 38–126)
Anion gap: 12 (ref 5–15)
BUN: 21 mg/dL — ABNORMAL HIGH (ref 6–20)
CO2: 32 mmol/L (ref 22–32)
Calcium: 9 mg/dL (ref 8.9–10.3)
Chloride: 95 mmol/L — ABNORMAL LOW (ref 98–111)
Creatinine: 6.42 mg/dL (ref 0.61–1.24)
GFR, Est AFR Am: 10 mL/min — ABNORMAL LOW (ref >60)
GFR, Estimated: 9 mL/min — ABNORMAL LOW (ref >60)
Glucose, Bld: 111 mg/dL — ABNORMAL HIGH (ref 70–99)
Potassium: 3.8 mmol/L (ref 3.5–5.1)
Sodium: 139 mmol/L (ref 135–145)
Total Bilirubin: 0.8 mg/dL (ref 0.3–1.2)
Total Protein: 6.6 g/dL (ref 6.5–8.1)

## 2019-02-08 MED ORDER — PROCHLORPERAZINE MALEATE 10 MG PO TABS: 10.0000 mg | ORAL_TABLET | Freq: Once | ORAL | Status: DC

## 2019-02-08 MED ORDER — BORTEZOMIB CHEMO SQ INJECTION 3.5 MG (2.5MG/ML)
1.5000 mg/m2 | Freq: Once | INTRAMUSCULAR | Status: AC
  Administered 2019-02-08: 3.25 mg via SUBCUTANEOUS
  Filled 2019-02-08: qty 1.3

## 2019-02-08 NOTE — Telephone Encounter (Signed)
Received call report from [facility].  "Today's Creat. = 6.42."  Secure Chat message sent with results has been read by provider.

## 2019-02-08 NOTE — Patient Instructions (Signed)
[place] Discharge Instructions for Patients Receiving Chemotherapy  Today you received the following chemotherapy agents: Bortezomib (VELCADE).  To help prevent nausea and vomiting after your treatment, we encourage you to take your nausea medication as prescribed.   If you develop nausea and vomiting that is not controlled by your nausea medication, call the clinic.   BELOW ARE SYMPTOMS THAT SHOULD BE REPORTED IMMEDIATELY:  *FEVER GREATER THAN 100.5 F  *CHILLS WITH OR WITHOUT FEVER  NAUSEA AND VOMITING THAT IS NOT CONTROLLED WITH YOUR NAUSEA MEDICATION  *UNUSUAL SHORTNESS OF BREATH  *UNUSUAL BRUISING OR BLEEDING  TENDERNESS IN MOUTH AND THROAT WITH OR WITHOUT PRESENCE OF ULCERS  *URINARY PROBLEMS  *BOWEL PROBLEMS  UNUSUAL RASH Items with * indicate a potential emergency and should be followed up as soon as possible.  Feel free to call the clinic should you have any questions or concerns. The clinic phone number is [phone].  Please show the [place] at check-in to the Emergency Department and triage nurse.   Coronavirus (COVID-19) Are you at risk?  Are you at risk for the Coronavirus (COVID-19)?  To be considered HIGH RISK for Coronavirus (COVID-19), you have to meet the following criteria:  . Traveled to [place], [place], [place], [place] or [place]; or in the [place] to [place], [place], [place], or [place]; and have fever, cough, and shortness of breath within the last 2 weeks of travel OR . Been in close contact with a person diagnosed with COVID-19 within the last 2 weeks and have fever, cough, and shortness of breath . IF YOU DO NOT MEET THESE CRITERIA, YOU ARE CONSIDERED LOW RISK FOR COVID-19.  What to do if you are HIGH RISK for COVID-19?  [name] If you are having a medical emergency, call 911. . Seek medical care right away. Before you go to a doctor's office, urgent care or emergency department, call ahead and tell them  about your recent travel, contact with someone diagnosed with COVID-19, and your symptoms. You should receive instructions from your physician's office regarding next steps of care.  . When you arrive at healthcare provider, tell the healthcare staff immediately you have returned from visiting [place], [place], [place], [place] or [place]; or traveled in the [place] to [place], [place], [place], or [place]; in the last two weeks or you have been in close contact with a person diagnosed with COVID-19 in the last 2 weeks.   . Tell the health care staff about your symptoms: fever, cough and shortness of breath. . After you have been seen by a medical provider, you will be either: o Tested for (COVID-19) and discharged home on quarantine except to seek medical care if symptoms worsen, and asked to  - Stay home and avoid contact with others until you get your results (4-5 days)  - Avoid travel on public transportation if possible (such as bus, train, or airplane) or o Sent to the Emergency Department by EMS for evaluation, COVID-19 testing, and possible admission depending on your condition and test results.  What to do if you are LOW RISK for COVID-19?  Reduce your risk of any infection by using the same precautions used for avoiding the common cold or flu:  [name] Wash your hands often with soap and warm water for at least 20 seconds.  If soap and water are not readily available, use an alcohol-based hand sanitizer with at least 60% alcohol.  . If coughing  or sneezing, cover your mouth and nose by coughing or sneezing into the elbow areas of your shirt or coat, into a tissue or into your sleeve (not your hands). . Avoid shaking hands with others and consider head nods or verbal greetings only. . Avoid touching your eyes, nose, or mouth with unwashed hands.  . Avoid close contact with people who are sick. . Avoid places or events with large numbers of people in one location, like concerts or  sporting events. . Carefully consider travel plans you have or are making. . If you are planning any travel outside or inside the [place], visit the CDC's Travelers' Health webpage for the latest health notices. . If you have some symptoms but not all symptoms, continue to monitor at home and seek medical attention if your symptoms worsen. . If you are having a medical emergency, call 911.   [place] / e-Visit: [email or web address]         [facility]: [facility]: [phone]                   [facility]: [phone]

## 2019-02-09 DIAGNOSIS — D631 Anemia in chronic kidney disease: Secondary | ICD-10-CM | POA: Diagnosis not present

## 2019-02-09 DIAGNOSIS — N186 End stage renal disease: Secondary | ICD-10-CM | POA: Diagnosis not present

## 2019-02-09 DIAGNOSIS — N2581 Secondary hyperparathyroidism of renal origin: Secondary | ICD-10-CM | POA: Diagnosis not present

## 2019-02-09 DIAGNOSIS — E1129 Type 2 diabetes mellitus with other diabetic kidney complication: Secondary | ICD-10-CM | POA: Diagnosis not present

## 2019-02-09 DIAGNOSIS — D509 Iron deficiency anemia, unspecified: Secondary | ICD-10-CM | POA: Diagnosis not present

## 2019-02-11 DIAGNOSIS — N2581 Secondary hyperparathyroidism of renal origin: Secondary | ICD-10-CM | POA: Diagnosis not present

## 2019-02-11 DIAGNOSIS — D631 Anemia in chronic kidney disease: Secondary | ICD-10-CM | POA: Diagnosis not present

## 2019-02-11 DIAGNOSIS — D509 Iron deficiency anemia, unspecified: Secondary | ICD-10-CM | POA: Diagnosis not present

## 2019-02-11 DIAGNOSIS — N186 End stage renal disease: Secondary | ICD-10-CM | POA: Diagnosis not present

## 2019-02-11 DIAGNOSIS — E1129 Type 2 diabetes mellitus with other diabetic kidney complication: Secondary | ICD-10-CM | POA: Diagnosis not present

## 2019-02-13 MED FILL — SUBVENITE 150 MG TABS: 150 | 90 days supply | Qty: 180 | Fill #1

## 2019-02-14 DIAGNOSIS — D509 Iron deficiency anemia, unspecified: Secondary | ICD-10-CM | POA: Diagnosis not present

## 2019-02-14 DIAGNOSIS — N186 End stage renal disease: Secondary | ICD-10-CM | POA: Diagnosis not present

## 2019-02-14 DIAGNOSIS — N2581 Secondary hyperparathyroidism of renal origin: Secondary | ICD-10-CM | POA: Diagnosis not present

## 2019-02-14 DIAGNOSIS — D631 Anemia in chronic kidney disease: Secondary | ICD-10-CM | POA: Diagnosis not present

## 2019-02-14 DIAGNOSIS — E1129 Type 2 diabetes mellitus with other diabetic kidney complication: Secondary | ICD-10-CM | POA: Diagnosis not present

## 2019-02-15 ENCOUNTER — Inpatient Hospital Stay: Payer: Medicare Other

## 2019-02-15 ENCOUNTER — Other Ambulatory Visit: Payer: Self-pay

## 2019-02-15 VITALS — BP 146/84 | HR 62 | Temp 98.3°F | Resp 17

## 2019-02-15 DIAGNOSIS — C9000 Multiple myeloma not having achieved remission: Secondary | ICD-10-CM

## 2019-02-15 DIAGNOSIS — Z5112 Encounter for antineoplastic immunotherapy: Secondary | ICD-10-CM | POA: Diagnosis not present

## 2019-02-15 LAB — CMP (CANCER CENTER ONLY)
ALT: <6 U/L (ref 0–44)
AST: 11 U/L — ABNORMAL LOW (ref 15–41)
Albumin: 3.4 g/dL — ABNORMAL LOW (ref 3.5–5.0)
Alkaline Phosphatase: 69 U/L (ref 38–126)
Anion gap: 13 (ref 5–15)
BUN: 20 mg/dL (ref 6–20)
CO2: 32 mmol/L (ref 22–32)
Calcium: 8.3 mg/dL — ABNORMAL LOW (ref 8.9–10.3)
Chloride: 94 mmol/L — ABNORMAL LOW (ref 98–111)
Creatinine: 6.58 mg/dL (ref 0.61–1.24)
GFR, Est AFR Am: 10 mL/min — ABNORMAL LOW (ref >60)
GFR, Estimated: 9 mL/min — ABNORMAL LOW (ref >60)
Glucose, Bld: 120 mg/dL — ABNORMAL HIGH (ref 70–99)
Potassium: 4.1 mmol/L (ref 3.5–5.1)
Sodium: 139 mmol/L (ref 135–145)
Total Bilirubin: 0.7 mg/dL (ref 0.3–1.2)
Total Protein: 6.5 g/dL (ref 6.5–8.1)

## 2019-02-15 LAB — CBC WITH DIFFERENTIAL (CANCER CENTER ONLY)
Abs Immature Granulocytes: 0.01 10*3/uL (ref 0.00–0.07)
Basophils Absolute: 0.1 10*3/uL (ref 0.0–0.1)
Basophils Relative: 2 %
Eosinophils Absolute: 0.6 10*3/uL — ABNORMAL HIGH (ref 0.0–0.5)
Eosinophils Relative: 12 %
HCT: 27.6 % — ABNORMAL LOW (ref 39.0–52.0)
Hemoglobin: 10 g/dL — ABNORMAL LOW (ref 13.0–17.0)
Immature Granulocytes: 0 %
Lymphocytes Relative: 0 %
Lymphs Abs: 0 10*3/uL — ABNORMAL LOW (ref 0.7–4.0)
MCH: 31.6 pg (ref 26.0–34.0)
MCHC: 36.2 g/dL — ABNORMAL HIGH (ref 30.0–36.0)
MCV: 87.3 fL (ref 80.0–100.0)
Monocytes Absolute: 1.3 10*3/uL — ABNORMAL HIGH (ref 0.1–1.0)
Monocytes Relative: 26 %
Neutro Abs: 3 10*3/uL (ref 1.7–7.7)
Neutrophils Relative %: 60 %
Platelet Count: 199 10*3/uL (ref 150–400)
RBC: 3.16 MIL/uL — ABNORMAL LOW (ref 4.22–5.81)
RDW: 18.5 % — ABNORMAL HIGH (ref 11.5–15.5)
WBC Count: 5 10*3/uL (ref 4.0–10.5)
nRBC: 10.9 % — ABNORMAL HIGH (ref 0.0–0.2)

## 2019-02-15 MED ORDER — PROCHLORPERAZINE MALEATE 10 MG PO TABS: 10.0000 mg | ORAL_TABLET | Freq: Once | ORAL | Status: DC

## 2019-02-15 MED ORDER — BORTEZOMIB CHEMO SQ INJECTION 3.5 MG (2.5MG/ML)
1.5000 mg/m2 | Freq: Once | INTRAMUSCULAR | Status: AC
  Administered 2019-02-15: 3.25 mg via SUBCUTANEOUS
  Filled 2019-02-15: qty 1.3

## 2019-02-15 NOTE — Progress Notes (Signed)
Dr. Alen Blew made aware of elevated creatinine and stated it is ok to treat today.

## 2019-02-15 NOTE — Patient Instructions (Signed)
[place] Discharge Instructions for Patients Receiving Chemotherapy  Today you received the following chemotherapy agents Velcade  To help prevent nausea and vomiting after your treatment, we encourage you to take your nausea medication as directed   If you develop nausea and vomiting that is not controlled by your nausea medication, call the clinic.   BELOW ARE SYMPTOMS THAT SHOULD BE REPORTED IMMEDIATELY:  *FEVER GREATER THAN 100.5 F  *CHILLS WITH OR WITHOUT FEVER  NAUSEA AND VOMITING THAT IS NOT CONTROLLED WITH YOUR NAUSEA MEDICATION  *UNUSUAL SHORTNESS OF BREATH  *UNUSUAL BRUISING OR BLEEDING  TENDERNESS IN MOUTH AND THROAT WITH OR WITHOUT PRESENCE OF ULCERS  *URINARY PROBLEMS  *BOWEL PROBLEMS  UNUSUAL RASH Items with * indicate a potential emergency and should be followed up as soon as possible.  Feel free to call the clinic should you have any questions or concerns. The clinic phone number is [phone].  Please show the [place] at check-in to the Emergency Department and triage nurse.

## 2019-02-16 DIAGNOSIS — D631 Anemia in chronic kidney disease: Secondary | ICD-10-CM | POA: Diagnosis not present

## 2019-02-16 DIAGNOSIS — D509 Iron deficiency anemia, unspecified: Secondary | ICD-10-CM | POA: Diagnosis not present

## 2019-02-16 DIAGNOSIS — N186 End stage renal disease: Secondary | ICD-10-CM | POA: Diagnosis not present

## 2019-02-16 DIAGNOSIS — N2581 Secondary hyperparathyroidism of renal origin: Secondary | ICD-10-CM | POA: Diagnosis not present

## 2019-02-16 DIAGNOSIS — E1129 Type 2 diabetes mellitus with other diabetic kidney complication: Secondary | ICD-10-CM | POA: Diagnosis not present

## 2019-02-16 LAB — BASIC METABOLIC PANEL
BUN: 42 — AB (ref 4–21)
Creatinine: 9.1 — AB (ref 0.6–1.3)

## 2019-02-16 LAB — CBC AND DIFFERENTIAL
HCT: 30 — AB (ref 41–53)
Hemoglobin: 10.1 — AB (ref 13.5–17.5)

## 2019-02-18 DIAGNOSIS — D509 Iron deficiency anemia, unspecified: Secondary | ICD-10-CM | POA: Diagnosis not present

## 2019-02-18 DIAGNOSIS — D631 Anemia in chronic kidney disease: Secondary | ICD-10-CM | POA: Diagnosis not present

## 2019-02-18 DIAGNOSIS — N186 End stage renal disease: Secondary | ICD-10-CM | POA: Diagnosis not present

## 2019-02-18 DIAGNOSIS — N2581 Secondary hyperparathyroidism of renal origin: Secondary | ICD-10-CM | POA: Diagnosis not present

## 2019-02-18 DIAGNOSIS — E1129 Type 2 diabetes mellitus with other diabetic kidney complication: Secondary | ICD-10-CM | POA: Diagnosis not present

## 2019-02-21 DIAGNOSIS — E1129 Type 2 diabetes mellitus with other diabetic kidney complication: Secondary | ICD-10-CM | POA: Diagnosis not present

## 2019-02-21 DIAGNOSIS — N186 End stage renal disease: Secondary | ICD-10-CM | POA: Diagnosis not present

## 2019-02-21 DIAGNOSIS — N2581 Secondary hyperparathyroidism of renal origin: Secondary | ICD-10-CM | POA: Diagnosis not present

## 2019-02-21 DIAGNOSIS — D631 Anemia in chronic kidney disease: Secondary | ICD-10-CM | POA: Diagnosis not present

## 2019-02-21 DIAGNOSIS — D509 Iron deficiency anemia, unspecified: Secondary | ICD-10-CM | POA: Diagnosis not present

## 2019-02-22 ENCOUNTER — Other Ambulatory Visit: Payer: Self-pay

## 2019-02-22 ENCOUNTER — Inpatient Hospital Stay: Payer: Medicare Other

## 2019-02-22 VITALS — BP 119/69 | HR 72 | Temp 98.2°F | Resp 17 | Ht 65.0 in | Wt 207.0 lb

## 2019-02-22 DIAGNOSIS — C9000 Multiple myeloma not having achieved remission: Secondary | ICD-10-CM

## 2019-02-22 DIAGNOSIS — Z5112 Encounter for antineoplastic immunotherapy: Secondary | ICD-10-CM | POA: Diagnosis not present

## 2019-02-22 LAB — CBC WITH DIFFERENTIAL (CANCER CENTER ONLY)
Abs Immature Granulocytes: 0.02 10*3/uL (ref 0.00–0.07)
Basophils Absolute: 0.2 10*3/uL — ABNORMAL HIGH (ref 0.0–0.1)
Basophils Relative: 3 %
Eosinophils Absolute: 0.7 10*3/uL — ABNORMAL HIGH (ref 0.0–0.5)
Eosinophils Relative: 12 %
HCT: 26.8 % — ABNORMAL LOW (ref 39.0–52.0)
Hemoglobin: 9.7 g/dL — ABNORMAL LOW (ref 13.0–17.0)
Immature Granulocytes: 0 %
Lymphocytes Relative: 0 %
Lymphs Abs: 0 10*3/uL — ABNORMAL LOW (ref 0.7–4.0)
MCH: 31.6 pg (ref 26.0–34.0)
MCHC: 36.2 g/dL — ABNORMAL HIGH (ref 30.0–36.0)
MCV: 87.3 fL (ref 80.0–100.0)
Monocytes Absolute: 1 10*3/uL (ref 0.1–1.0)
Monocytes Relative: 18 %
Neutro Abs: 3.9 10*3/uL (ref 1.7–7.7)
Neutrophils Relative %: 67 %
Platelet Count: 165 10*3/uL (ref 150–400)
RBC: 3.07 MIL/uL — ABNORMAL LOW (ref 4.22–5.81)
RDW: 18.3 % — ABNORMAL HIGH (ref 11.5–15.5)
WBC Count: 5.7 10*3/uL (ref 4.0–10.5)
nRBC: 10 % — ABNORMAL HIGH (ref 0.0–0.2)

## 2019-02-22 LAB — CMP (CANCER CENTER ONLY)
ALT: 9 U/L (ref 0–44)
AST: 14 U/L — ABNORMAL LOW (ref 15–41)
Albumin: 3.3 g/dL — ABNORMAL LOW (ref 3.5–5.0)
Alkaline Phosphatase: 66 U/L (ref 38–126)
Anion gap: 14 (ref 5–15)
BUN: 19 mg/dL (ref 6–20)
CO2: 30 mmol/L (ref 22–32)
Calcium: 8.3 mg/dL — ABNORMAL LOW (ref 8.9–10.3)
Chloride: 95 mmol/L — ABNORMAL LOW (ref 98–111)
Creatinine: 6.63 mg/dL (ref 0.61–1.24)
GFR, Est AFR Am: 10 mL/min — ABNORMAL LOW (ref >60)
GFR, Estimated: 9 mL/min — ABNORMAL LOW (ref >60)
Glucose, Bld: 133 mg/dL — ABNORMAL HIGH (ref 70–99)
Potassium: 4.2 mmol/L (ref 3.5–5.1)
Sodium: 139 mmol/L (ref 135–145)
Total Bilirubin: 0.7 mg/dL (ref 0.3–1.2)
Total Protein: 6.4 g/dL — ABNORMAL LOW (ref 6.5–8.1)

## 2019-02-22 MED ORDER — PROCHLORPERAZINE MALEATE 10 MG PO TABS: 10.0000 mg | ORAL_TABLET | Freq: Once | ORAL | Status: DC

## 2019-02-22 MED ORDER — BORTEZOMIB CHEMO SQ INJECTION 3.5 MG (2.5MG/ML)
1.5000 mg/m2 | Freq: Once | INTRAMUSCULAR | Status: AC
  Administered 2019-02-22: 3.25 mg via SUBCUTANEOUS
  Filled 2019-02-22: qty 1.3

## 2019-02-22 NOTE — Patient Instructions (Signed)
[facility] Discharge Instructions for Patients Receiving Chemotherapy  Today you received the following chemotherapy agents: Bortezomib (Velcade)  To help prevent nausea and vomiting after your treatment, we encourage you to take your nausea medication as directed.   If you develop nausea and vomiting that is not controlled by your nausea medication, call the clinic.   BELOW ARE SYMPTOMS THAT SHOULD BE REPORTED IMMEDIATELY:  *FEVER GREATER THAN 100.5 F  *CHILLS WITH OR WITHOUT FEVER  NAUSEA AND VOMITING THAT IS NOT CONTROLLED WITH YOUR NAUSEA MEDICATION  *UNUSUAL SHORTNESS OF BREATH  *UNUSUAL BRUISING OR BLEEDING  TENDERNESS IN MOUTH AND THROAT WITH OR WITHOUT PRESENCE OF ULCERS  *URINARY PROBLEMS  *BOWEL PROBLEMS  UNUSUAL RASH Items with * indicate a potential emergency and should be followed up as soon as possible.  Feel free to call the clinic should you have any questions or concerns. The clinic phone number is [phone].  Please show the [place] at check-in to the Emergency Department and triage nurse.  Coronavirus (COVID-19) Are you at risk?  Are you at risk for the Coronavirus (COVID-19)?  To be considered HIGH RISK for Coronavirus (COVID-19), you have to meet the following criteria:  . Traveled to [place], [place], [place], [place] or [place]; or in the [place] to [place], [place], [place], or [place]; and have fever, cough, and shortness of breath within the last 2 weeks of travel OR . Been in close contact with a person diagnosed with COVID-19 within the last 2 weeks and have fever, cough, and shortness of breath . IF YOU DO NOT MEET THESE CRITERIA, YOU ARE CONSIDERED LOW RISK FOR COVID-19.  What to do if you are HIGH RISK for COVID-19?  [name] If you are having a medical emergency, call 911. . Seek medical care right away. Before you go to a doctor's office, urgent care or emergency department, call ahead and tell them  about your recent travel, contact with someone diagnosed with COVID-19, and your symptoms. You should receive instructions from your physician's office regarding next steps of care.  . When you arrive at healthcare provider, tell the healthcare staff immediately you have returned from visiting [place], [place], [place], [place] or [place]; or traveled in the [place] to [place], [place], [place], or [place]; in the last two weeks or you have been in close contact with a person diagnosed with COVID-19 in the last 2 weeks.   . Tell the health care staff about your symptoms: fever, cough and shortness of breath. . After you have been seen by a medical provider, you will be either: o Tested for (COVID-19) and discharged home on quarantine except to seek medical care if symptoms worsen, and asked to  - Stay home and avoid contact with others until you get your results (4-5 days)  - Avoid travel on public transportation if possible (such as bus, train, or airplane) or o Sent to the Emergency Department by EMS for evaluation, COVID-19 testing, and possible admission depending on your condition and test results.  What to do if you are LOW RISK for COVID-19?  Reduce your risk of any infection by using the same precautions used for avoiding the common cold or flu:  [name] Wash your hands often with soap and warm water for at least 20 seconds.  If soap and water are not readily available, use an alcohol-based hand sanitizer with at least 60% alcohol.  . If coughing or  sneezing, cover your mouth and nose by coughing or sneezing into the elbow areas of your shirt or coat, into a tissue or into your sleeve (not your hands). . Avoid shaking hands with others and consider head nods or verbal greetings only. . Avoid touching your eyes, nose, or mouth with unwashed hands.  . Avoid close contact with people who are sick. . Avoid places or events with large numbers of people in one location, like concerts or  sporting events. . Carefully consider travel plans you have or are making. . If you are planning any travel outside or inside the [place], visit the CDC's Travelers' Health webpage for the latest health notices. . If you have some symptoms but not all symptoms, continue to monitor at home and seek medical attention if your symptoms worsen. . If you are having a medical emergency, call 911.   [place] / e-Visit: [email or web address]         [facility]: [facility]: [phone]                   [facility]: [phone]

## 2019-02-23 DIAGNOSIS — N186 End stage renal disease: Secondary | ICD-10-CM | POA: Diagnosis not present

## 2019-02-23 DIAGNOSIS — D631 Anemia in chronic kidney disease: Secondary | ICD-10-CM | POA: Diagnosis not present

## 2019-02-23 DIAGNOSIS — D509 Iron deficiency anemia, unspecified: Secondary | ICD-10-CM | POA: Diagnosis not present

## 2019-02-23 DIAGNOSIS — E1129 Type 2 diabetes mellitus with other diabetic kidney complication: Secondary | ICD-10-CM | POA: Diagnosis not present

## 2019-02-23 DIAGNOSIS — N2581 Secondary hyperparathyroidism of renal origin: Secondary | ICD-10-CM | POA: Diagnosis not present

## 2019-02-24 MED FILL — CYCLOPHOSPHAMIDE 50 MG CAPS: 50 | 28 days supply | Qty: 52 | Fill #2

## 2019-02-25 DIAGNOSIS — E1129 Type 2 diabetes mellitus with other diabetic kidney complication: Secondary | ICD-10-CM | POA: Diagnosis not present

## 2019-02-25 DIAGNOSIS — D631 Anemia in chronic kidney disease: Secondary | ICD-10-CM | POA: Diagnosis not present

## 2019-02-25 DIAGNOSIS — D509 Iron deficiency anemia, unspecified: Secondary | ICD-10-CM | POA: Diagnosis not present

## 2019-02-25 DIAGNOSIS — N186 End stage renal disease: Secondary | ICD-10-CM | POA: Diagnosis not present

## 2019-02-25 DIAGNOSIS — N2581 Secondary hyperparathyroidism of renal origin: Secondary | ICD-10-CM | POA: Diagnosis not present

## 2019-02-27 DIAGNOSIS — E1065 Type 1 diabetes mellitus with hyperglycemia: Secondary | ICD-10-CM | POA: Diagnosis not present

## 2019-02-27 MED FILL — DEXAMETHASONE 4 MG TABLET: 4 | 84 days supply | Qty: 60 | Fill #2

## 2019-02-28 DIAGNOSIS — E1129 Type 2 diabetes mellitus with other diabetic kidney complication: Secondary | ICD-10-CM | POA: Diagnosis not present

## 2019-02-28 DIAGNOSIS — D509 Iron deficiency anemia, unspecified: Secondary | ICD-10-CM | POA: Diagnosis not present

## 2019-02-28 DIAGNOSIS — N2581 Secondary hyperparathyroidism of renal origin: Secondary | ICD-10-CM | POA: Diagnosis not present

## 2019-02-28 DIAGNOSIS — D631 Anemia in chronic kidney disease: Secondary | ICD-10-CM | POA: Diagnosis not present

## 2019-02-28 DIAGNOSIS — N186 End stage renal disease: Secondary | ICD-10-CM | POA: Diagnosis not present

## 2019-02-28 MED FILL — CALCIUM ACETATE 667 MG CAP: 667 | 30 days supply | Qty: 390 | Fill #1

## 2019-03-01 ENCOUNTER — Inpatient Hospital Stay (HOSPITAL_BASED_OUTPATIENT_CLINIC_OR_DEPARTMENT_OTHER): Payer: Medicare Other | Admitting: Oncology

## 2019-03-01 ENCOUNTER — Other Ambulatory Visit: Payer: Self-pay

## 2019-03-01 ENCOUNTER — Inpatient Hospital Stay: Payer: Medicare Other

## 2019-03-01 ENCOUNTER — Telehealth: Payer: Self-pay | Admitting: *Deleted

## 2019-03-01 ENCOUNTER — Telehealth: Payer: Self-pay

## 2019-03-01 VITALS — BP 140/68 | HR 77 | Temp 98.2°F | Resp 17 | Ht 65.0 in | Wt 208.1 lb

## 2019-03-01 DIAGNOSIS — D630 Anemia in neoplastic disease: Secondary | ICD-10-CM | POA: Diagnosis not present

## 2019-03-01 DIAGNOSIS — E119 Type 2 diabetes mellitus without complications: Secondary | ICD-10-CM | POA: Diagnosis not present

## 2019-03-01 DIAGNOSIS — N189 Chronic kidney disease, unspecified: Secondary | ICD-10-CM | POA: Diagnosis not present

## 2019-03-01 DIAGNOSIS — C9000 Multiple myeloma not having achieved remission: Secondary | ICD-10-CM | POA: Diagnosis not present

## 2019-03-01 DIAGNOSIS — Z992 Dependence on renal dialysis: Secondary | ICD-10-CM | POA: Diagnosis not present

## 2019-03-01 DIAGNOSIS — D631 Anemia in chronic kidney disease: Secondary | ICD-10-CM

## 2019-03-01 DIAGNOSIS — Z5112 Encounter for antineoplastic immunotherapy: Secondary | ICD-10-CM | POA: Diagnosis not present

## 2019-03-01 DIAGNOSIS — D472 Monoclonal gammopathy: Secondary | ICD-10-CM

## 2019-03-01 LAB — CBC WITH DIFFERENTIAL (CANCER CENTER ONLY)
Abs Immature Granulocytes: 0.02 10*3/uL (ref 0.00–0.07)
Basophils Absolute: 0.1 10*3/uL (ref 0.0–0.1)
Basophils Relative: 2 %
Eosinophils Absolute: 0.2 10*3/uL (ref 0.0–0.5)
Eosinophils Relative: 3 %
HCT: 27.7 % — ABNORMAL LOW (ref 39.0–52.0)
Hemoglobin: 10.1 g/dL — ABNORMAL LOW (ref 13.0–17.0)
Immature Granulocytes: 0 %
Lymphocytes Relative: 0 %
Lymphs Abs: 0 10*3/uL — ABNORMAL LOW (ref 0.7–4.0)
MCH: 31.4 pg (ref 26.0–34.0)
MCHC: 36.5 g/dL — ABNORMAL HIGH (ref 30.0–36.0)
MCV: 86 fL (ref 80.0–100.0)
Monocytes Absolute: 0.4 10*3/uL (ref 0.1–1.0)
Monocytes Relative: 6 %
Neutro Abs: 5.5 10*3/uL (ref 1.7–7.7)
Neutrophils Relative %: 89 %
Platelet Count: 162 10*3/uL (ref 150–400)
RBC: 3.22 MIL/uL — ABNORMAL LOW (ref 4.22–5.81)
RDW: 18.2 % — ABNORMAL HIGH (ref 11.5–15.5)
WBC Count: 6.2 10*3/uL (ref 4.0–10.5)
nRBC: 12.8 % — ABNORMAL HIGH (ref 0.0–0.2)

## 2019-03-01 LAB — CMP (CANCER CENTER ONLY)
ALT: 7 U/L (ref 0–44)
AST: 13 U/L — ABNORMAL LOW (ref 15–41)
Albumin: 3.6 g/dL (ref 3.5–5.0)
Alkaline Phosphatase: 70 U/L (ref 38–126)
Anion gap: 12 (ref 5–15)
BUN: 23 mg/dL — ABNORMAL HIGH (ref 6–20)
CO2: 31 mmol/L (ref 22–32)
Calcium: 8.5 mg/dL — ABNORMAL LOW (ref 8.9–10.3)
Chloride: 95 mmol/L — ABNORMAL LOW (ref 98–111)
Creatinine: 7.32 mg/dL (ref 0.61–1.24)
GFR, Est AFR Am: 9 mL/min — ABNORMAL LOW (ref >60)
GFR, Estimated: 8 mL/min — ABNORMAL LOW (ref >60)
Glucose, Bld: 182 mg/dL — ABNORMAL HIGH (ref 70–99)
Potassium: 3.8 mmol/L (ref 3.5–5.1)
Sodium: 138 mmol/L (ref 135–145)
Total Bilirubin: 0.7 mg/dL (ref 0.3–1.2)
Total Protein: 6.8 g/dL (ref 6.5–8.1)

## 2019-03-01 MED ORDER — BORTEZOMIB CHEMO SQ INJECTION 3.5 MG (2.5MG/ML)
1.5000 mg/m2 | Freq: Once | INTRAMUSCULAR | Status: AC
  Administered 2019-03-01: 3.25 mg via SUBCUTANEOUS
  Filled 2019-03-01: qty 1.3

## 2019-03-01 MED ORDER — PROCHLORPERAZINE MALEATE 10 MG PO TABS: 10.0000 mg | ORAL_TABLET | Freq: Once | ORAL | Status: DC

## 2019-03-01 NOTE — Progress Notes (Signed)
Hematology and Oncology Follow Up Visit  [place] [id number] 1966-03-05 53 y.o. 03/01/2019 11:46 AM    Principle Diagnosis: 53 year old man with  1.  IgG kappa multiple myeloma diagnosed in September 2019.  He presented with 20% plasma involvement evolving from MGUS diagnosed in 2012.  2.  Chronic renal failure currently hemodialysis dependent.  Current therapy: Velcade dexamethasone and cyclophosphamide weekly started on 05/25/2018.  Velcade is given at 1.5 mg/m weekly, dexamethasone is 20 mg weekly and and Cytoxan is given at 650 mg total dose.    Interim History:  Isaiah [name] is here for a follow-up.  Since the last visit, he continues to tolerate chemotherapy without any major complications.  He denies any peripheral neuropathy, nausea or excessive fatigue.  He denies any decline in his appetite. HIs performance status and quality of life remains unchanged.  He continues to receive dialysis without any issues.   He denied any alteration mental status, neuropathy, confusion or dizziness.  Denies any headaches or lethargy.  Denies any night sweats, weight loss or changes in appetite.  Denied orthopnea, dyspnea on exertion or chest discomfort.  Denies shortness of breath, difficulty breathing hemoptysis or cough.  Denies any abdominal distention, nausea, early satiety or dyspepsia.  Denies any hematuria, frequency, dysuria or nocturia.  Denies any skin irritation, dryness or rash.  Denies any ecchymosis or petechiae.  Denies any lymphadenopathy or clotting.  Denies any heat or cold intolerance.  Denies any anxiety or depression.  Remaining review of system is negative.          Medications: I have reviewed the patient's current medications.  Current Outpatient Medications:  .  acyclovir (ZOVIRAX) 400 MG tablet, TAKE 1 TABLET (400 MG TOTAL) BY MOUTH DAILY., Disp: 90 tablet, Rfl: 0 .  albuterol (PROAIR HFA) 108 (90 Base) MCG/ACT inhaler, Inhale two puffs every 4-6 hours if needed for  cough or wheeze, Disp: 1 Inhaler, Rfl: 1 .  aspirin EC 81 MG tablet, Take 1 tablet (81 mg total) by mouth daily., Disp: 100 tablet, Rfl: 0 .  BREO ELLIPTA 200-25 MCG/INH AEPB, INHALE 1 PUFF BY MOUTH AT THE SAME TIME EVERY DAY, Disp: 60 each, Rfl: 2 .  calcium acetate (PHOSLO) 667 MG capsule, Take 2,001 mg by mouth 3 (three) times daily with meals. , Disp: , Rfl:  .  Cetirizine HCl 10 MG CAPS, Take 1 capsule by mouth daily., Disp: , Rfl:  .  cinacalcet (SENSIPAR) 60 MG tablet, Take 60 mg by mouth every other day. , Disp: , Rfl:  .  clonazePAM (KLONOPIN) 0.5 MG tablet, TAKE 1 TABLET BY MOUTH PRIOR TO EACH DIALYSIS SESSION, Disp: 45 tablet, Rfl: 3 .  cyclophosphamide (CYTOXAN) 50 MG capsule, TAKE 13 CAPSULES (650MG) BY MOUTH ONCE WEEKLY, TAKE EARLY IN THE DAY, Disp: 52 capsule, Rfl: 3 .  dexamethasone (DECADRON) 4 MG tablet, Take 5 tablets weekly, Disp: 90 tablet, Rfl: 3 .  fluticasone furoate-vilanterol (BREO ELLIPTA) 100-25 MCG/INH AEPB, Inhale one puff once daily to prevent cough or wheeze, Disp: 60 each, Rfl: 5 .  insulin lispro (HUMALOG) 100 UNIT/ML injection, Inject into the skin 3 (three) times daily before meals. 3 to 4 units TID prn SS, Disp: , Rfl:  .  Lacosamide (VIMPAT) 150 MG TABS, TAKE 1 TABLET BY MOUTH TWICE DAILY TAKE AN ADDITIONAL 1 TABLET AFTER DIALYSIS SESSION THREE TIMES A WEEK., Disp: 225 tablet, Rfl: 3 .  lamoTRIgine (LAMICTAL) 150 MG tablet, Take 1 tablet (150 mg total) by mouth 2 (  two) times daily., Disp: 180 tablet, Rfl: 3 .  metoprolol tartrate (LOPRESSOR) 25 MG tablet, Take 0.5 tablets (12.5 mg total) by mouth 2 (two) times daily., Disp: 60 tablet, Rfl: 0 .  midodrine (PROAMATINE) 10 MG tablet, Take 10 mg by mouth 2 (two) times daily with a meal. , Disp: , Rfl:  .  multivitamin (RENA-VIT) TABS tablet, Take 1 tablet by mouth at bedtime., Disp: 30 tablet, Rfl: 1 .  pantoprazole (PROTONIX) 40 MG tablet, TAKE 1 TABLET BY MOUTH ONCE DAILY, Disp: 90 tablet, Rfl: 0 .   prochlorperazine (COMPAZINE) 10 MG tablet, TAKE 1 TABLET (10 MG TOTAL) BY MOUTH EVERY 6 (SIX) HOURS AS NEEDED FOR NAUSEA OR VOMITING., Disp: 30 tablet, Rfl: 0  Allergies:  Allergies  Allergen Reactions  . Codeine Rash and Other (See Comments)    Unknown reaction (patient says it was more serious than just a rash, but he can't remember what happened) Unknown reaction (patient says it was more serious than just a rash, but he can't remember what happened)    Past Medical History, Surgical history, Social history, and Family History were reviewed and updated.  Physical Exam:  Blood pressure 140/68, pulse 77, temperature 98.2 F (36.8 C), temperature source Oral, resp. rate 17, height _0  (1.651 m), weight 208 lb 1.6 oz (94.4 kg), SpO2 96 %.    ECOG: 1     General appearance: Alert, awake without any distress. Head: Atraumatic without abnormalities Oropharynx: Without any thrush or ulcers. Eyes: No scleral icterus. Lymph nodes: No lymphadenopathy noted in the cervical, supraclavicular, or axillary nodes Heart:regular rate and rhythm, without any murmurs or gallops.   Lung: Clear to auscultation without any rhonchi, wheezes or dullness to percussion. Abdomin: Soft, nontender without any shifting dullness or ascites. Musculoskeletal: No clubbing or cyanosis. Neurological: No motor or sensory deficits. Skin: No rashes or lesions. Psychiatric: Mood and affect appeared normal.            Lab Results: Lab Results  Component Value Date   WBC 6.2 03/01/2019   HGB 10.1 (L) 03/01/2019   HCT 27.7 (L) 03/01/2019   MCV 86.0 03/01/2019   PLT 162 03/01/2019     Chemistry      Component Value Date/Time   NA 138 03/01/2019 1041   NA 138 07/03/2017 0813   K 3.8 03/01/2019 1041   K 4.2 07/03/2017 0813   CL 95 (L) 03/01/2019 1041   CO2 31 03/01/2019 1041   CO2 27 07/03/2017 0813   BUN 23 (H) 03/01/2019 1041   BUN 42 (A) 01/19/2019   BUN 46.4 (H) 07/03/2017 0813    CREATININE 7.32 (HH) 03/01/2019 1041   CREATININE 10.9 (HH) 07/03/2017 0813      Component Value Date/Time   CALCIUM 8.5 (L) 03/01/2019 1041   CALCIUM 9.3 07/03/2017 0813   ALKPHOS 70 03/01/2019 1041   ALKPHOS 77 07/03/2017 0813   AST 13 (L) 03/01/2019 1041   AST 16 07/03/2017 0813   ALT 7 03/01/2019 1041   ALT 14 07/03/2017 0813   BILITOT 0.7 03/01/2019 1041   BILITOT 0.93 07/03/2017 0813      Results for Isaiah [name], Isaiah [name] (MRN [id number]) as of 01/18/2019 10:50  Ref. Range 10/05/2018 13:10 11/02/2018 11:03 12/07/2018 08:21 01/11/2019 08:58  M Protein SerPl Elph-[facility] Latest Ref Range: Not Observed g/dL 0.9 (H) 0.6 (H) 0.6 (H) 0.4 (H)    Results for Isaiah [name], Isaiah [name] (MRN [id number]) as of 01/18/2019 10:50  Ref. Range 01/11/2019 08:59  Kappa free light chain Latest Ref Range: 3.3 - 19.4 mg/L 187.0 (H)  Lamda free light chains Latest Ref Range: 5.7 - 26.3 mg/L 63.6 (H)  Kappa, lamda light chain ratio Latest Ref Range: 0.26 - 1.65  2.94 (H)     Impression and Plan:  53 year old man with:  1.  IgG kappa multiple myeloma arising from MGUS documented in 2019.[name]     He has tolerated therapy without any major complications at this time.  Protein studies on Jan 11, 2019 were reiterated again which continue to show very good partial response.  Risks and benefits of continuing this treatment on long-term complications were reiterated.  Is agreeable to continue at this time.  He is under evaluation for possible high-dose chemotherapy and stem cell transplant.     2. Anemia: Related to plasma cell disorder and renal insufficiency.  Hemoglobin continues to be adequate.  3. Renal failure: He continues to be on dialysis without any recent problems or issues.  4.  Zoster prophylaxis: No reactivation noted he remains on acyclovir.  5.  Diabetes: Diabetes under control with dexamethasone without any exacerbation.  6.  Nausea prophylaxis: Antiemetics are available to him without any recent nausea or  vomiting.  7.  Goals of care: Aggressive therapy is warranted given his excellent performance status.   8. Follow-up: He will continue to receive weekly chemotherapy and MD follow-up in July 2020.  25  minutes was spent with the patient face-to-face today.  More than 50% of time was spent on reviewing his disease status, reviewing laboratory data as well as plan of care.     Zola Button, MD 6/23/202011:46 AM

## 2019-03-01 NOTE — Telephone Encounter (Signed)
Received call report from [place].  "Today's Creat = 7.32."  Routing phone encounter note with results.  Scheduled provider F/U today.

## 2019-03-01 NOTE — Patient Instructions (Signed)
[facility] Discharge Instructions for Patients Receiving Chemotherapy  Today you received the following chemotherapy agents Velcade  To help prevent nausea and vomiting after your treatment, we encourage you to take your nausea medication as directed by your MD.   If you develop nausea and vomiting that is not controlled by your nausea medication, call the clinic.   BELOW ARE SYMPTOMS THAT SHOULD BE REPORTED IMMEDIATELY:  *FEVER GREATER THAN 100.5 F  *CHILLS WITH OR WITHOUT FEVER  NAUSEA AND VOMITING THAT IS NOT CONTROLLED WITH YOUR NAUSEA MEDICATION  *UNUSUAL SHORTNESS OF BREATH  *UNUSUAL BRUISING OR BLEEDING  TENDERNESS IN MOUTH AND THROAT WITH OR WITHOUT PRESENCE OF ULCERS  *URINARY PROBLEMS  *BOWEL PROBLEMS  UNUSUAL RASH Items with * indicate a potential emergency and should be followed up as soon as possible.  Feel free to call the clinic should you have any questions or concerns. The clinic phone number is [phone].  Please show the [facility] at check-in to the Emergency Department and triage nurse.  Coronavirus (COVID-19) Are you at risk?  Are you at risk for the Coronavirus (COVID-19)?  To be considered HIGH RISK for Coronavirus (COVID-19), you have to meet the following criteria:  . Traveled to [place], [place], [place], [place] or [place]; or in the [place] to [place], [place], [place], or [place]; and have fever, cough, and shortness of breath within the last 2 weeks of travel OR . Been in close contact with a person diagnosed with COVID-19 within the last 2 weeks and have fever, cough, and shortness of breath . IF YOU DO NOT MEET THESE CRITERIA, YOU ARE CONSIDERED LOW RISK FOR COVID-19.  What to do if you are HIGH RISK for COVID-19?  [name] If you are having a medical emergency, call 911. . Seek medical care right away. Before you go to a doctor's office, urgent care or emergency department, call ahead and tell them about  your recent travel, contact with someone diagnosed with COVID-19, and your symptoms. You should receive instructions from your physician's office regarding next steps of care.  . When you arrive at healthcare provider, tell the healthcare staff immediately you have returned from visiting [place], [place], [place], [place] or [place]; or traveled in the [place] to [place], [place], [place], or [place]; in the last two weeks or you have been in close contact with a person diagnosed with COVID-19 in the last 2 weeks.   . Tell the health care staff about your symptoms: fever, cough and shortness of breath. . After you have been seen by a medical provider, you will be either: o Tested for (COVID-19) and discharged home on quarantine except to seek medical care if symptoms worsen, and asked to  - Stay home and avoid contact with others until you get your results (4-5 days)  - Avoid travel on public transportation if possible (such as bus, train, or airplane) or o Sent to the Emergency Department by EMS for evaluation, COVID-19 testing, and possible admission depending on your condition and test results.  What to do if you are LOW RISK for COVID-19?  Reduce your risk of any infection by using the same precautions used for avoiding the common cold or flu:  [name] Wash your hands often with soap and warm water for at least 20 seconds.  If soap and water are not readily available, use an alcohol-based hand sanitizer with at least 60% alcohol.  . If  coughing or sneezing, cover your mouth and nose by coughing or sneezing into the elbow areas of your shirt or coat, into a tissue or into your sleeve (not your hands). . Avoid shaking hands with others and consider head nods or verbal greetings only. . Avoid touching your eyes, nose, or mouth with unwashed hands.  . Avoid close contact with people who are sick. . Avoid places or events with large numbers of people in one location, like concerts or sporting  events. . Carefully consider travel plans you have or are making. . If you are planning any travel outside or inside the [place], visit the CDC's Travelers' Health webpage for the latest health notices. . If you have some symptoms but not all symptoms, continue to monitor at home and seek medical attention if your symptoms worsen. . If you are having a medical emergency, call 911.   [facility] / e-Visit: [email or web address]         [facility]: [facility]: [phone]                   [facility]: [phone]

## 2019-03-01 NOTE — Telephone Encounter (Signed)
Dr. Alen Blew made aware of creatinine of 7.32

## 2019-03-02 ENCOUNTER — Telehealth: Payer: Self-pay | Admitting: Oncology

## 2019-03-02 DIAGNOSIS — N2581 Secondary hyperparathyroidism of renal origin: Secondary | ICD-10-CM | POA: Diagnosis not present

## 2019-03-02 DIAGNOSIS — D631 Anemia in chronic kidney disease: Secondary | ICD-10-CM | POA: Diagnosis not present

## 2019-03-02 DIAGNOSIS — N186 End stage renal disease: Secondary | ICD-10-CM | POA: Diagnosis not present

## 2019-03-02 DIAGNOSIS — E1129 Type 2 diabetes mellitus with other diabetic kidney complication: Secondary | ICD-10-CM | POA: Diagnosis not present

## 2019-03-02 DIAGNOSIS — D509 Iron deficiency anemia, unspecified: Secondary | ICD-10-CM | POA: Diagnosis not present

## 2019-03-02 NOTE — Telephone Encounter (Signed)
Scheduled per los. Mailed printout  °

## 2019-03-04 DIAGNOSIS — D631 Anemia in chronic kidney disease: Secondary | ICD-10-CM | POA: Diagnosis not present

## 2019-03-04 DIAGNOSIS — N186 End stage renal disease: Secondary | ICD-10-CM | POA: Diagnosis not present

## 2019-03-04 DIAGNOSIS — D509 Iron deficiency anemia, unspecified: Secondary | ICD-10-CM | POA: Diagnosis not present

## 2019-03-04 DIAGNOSIS — E1129 Type 2 diabetes mellitus with other diabetic kidney complication: Secondary | ICD-10-CM | POA: Diagnosis not present

## 2019-03-04 DIAGNOSIS — N2581 Secondary hyperparathyroidism of renal origin: Secondary | ICD-10-CM | POA: Diagnosis not present

## 2019-03-07 ENCOUNTER — Encounter: Payer: Self-pay | Admitting: Internal Medicine

## 2019-03-07 DIAGNOSIS — N186 End stage renal disease: Secondary | ICD-10-CM | POA: Diagnosis not present

## 2019-03-07 DIAGNOSIS — D509 Iron deficiency anemia, unspecified: Secondary | ICD-10-CM | POA: Diagnosis not present

## 2019-03-07 DIAGNOSIS — N2581 Secondary hyperparathyroidism of renal origin: Secondary | ICD-10-CM | POA: Diagnosis not present

## 2019-03-07 DIAGNOSIS — E1129 Type 2 diabetes mellitus with other diabetic kidney complication: Secondary | ICD-10-CM | POA: Diagnosis not present

## 2019-03-07 DIAGNOSIS — D631 Anemia in chronic kidney disease: Secondary | ICD-10-CM | POA: Diagnosis not present

## 2019-03-08 ENCOUNTER — Inpatient Hospital Stay: Payer: Medicare Other

## 2019-03-08 ENCOUNTER — Other Ambulatory Visit: Payer: Self-pay

## 2019-03-08 ENCOUNTER — Telehealth: Payer: Self-pay | Admitting: *Deleted

## 2019-03-08 VITALS — BP 144/78 | HR 68 | Temp 98.5°F | Resp 16

## 2019-03-08 DIAGNOSIS — I639 Cerebral infarction, unspecified: Secondary | ICD-10-CM | POA: Diagnosis not present

## 2019-03-08 DIAGNOSIS — J449 Chronic obstructive pulmonary disease, unspecified: Secondary | ICD-10-CM | POA: Diagnosis not present

## 2019-03-08 DIAGNOSIS — I482 Chronic atrial fibrillation, unspecified: Secondary | ICD-10-CM | POA: Diagnosis not present

## 2019-03-08 DIAGNOSIS — Z5112 Encounter for antineoplastic immunotherapy: Secondary | ICD-10-CM | POA: Diagnosis not present

## 2019-03-08 DIAGNOSIS — R569 Unspecified convulsions: Secondary | ICD-10-CM | POA: Diagnosis not present

## 2019-03-08 DIAGNOSIS — C9000 Multiple myeloma not having achieved remission: Secondary | ICD-10-CM | POA: Diagnosis not present

## 2019-03-08 DIAGNOSIS — I272 Pulmonary hypertension, unspecified: Secondary | ICD-10-CM | POA: Diagnosis not present

## 2019-03-08 LAB — CMP (CANCER CENTER ONLY)
ALT: 7 U/L (ref 0–44)
AST: 12 U/L — ABNORMAL LOW (ref 15–41)
Albumin: 3.5 g/dL (ref 3.5–5.0)
Alkaline Phosphatase: 70 U/L (ref 38–126)
Anion gap: 11 (ref 5–15)
BUN: 19 mg/dL (ref 6–20)
CO2: 33 mmol/L — ABNORMAL HIGH (ref 22–32)
Calcium: 8.6 mg/dL — ABNORMAL LOW (ref 8.9–10.3)
Chloride: 97 mmol/L — ABNORMAL LOW (ref 98–111)
Creatinine: 6.55 mg/dL (ref 0.61–1.24)
GFR, Est AFR Am: 10 mL/min — ABNORMAL LOW (ref >60)
GFR, Estimated: 9 mL/min — ABNORMAL LOW (ref >60)
Glucose, Bld: 99 mg/dL (ref 70–99)
Potassium: 3.6 mmol/L (ref 3.5–5.1)
Sodium: 141 mmol/L (ref 135–145)
Total Bilirubin: 0.7 mg/dL (ref 0.3–1.2)
Total Protein: 6.5 g/dL (ref 6.5–8.1)

## 2019-03-08 LAB — CBC WITH DIFFERENTIAL (CANCER CENTER ONLY)
Abs Immature Granulocytes: 0.02 10*3/uL (ref 0.00–0.07)
Basophils Absolute: 0.1 10*3/uL (ref 0.0–0.1)
Basophils Relative: 2 %
Eosinophils Absolute: 0.5 10*3/uL (ref 0.0–0.5)
Eosinophils Relative: 12 %
HCT: 27.3 % — ABNORMAL LOW (ref 39.0–52.0)
Hemoglobin: 10 g/dL — ABNORMAL LOW (ref 13.0–17.0)
Immature Granulocytes: 1 %
Lymphocytes Relative: 0 %
Lymphs Abs: 0 10*3/uL — ABNORMAL LOW (ref 0.7–4.0)
MCH: 31.7 pg (ref 26.0–34.0)
MCHC: 36.6 g/dL — ABNORMAL HIGH (ref 30.0–36.0)
MCV: 86.7 fL (ref 80.0–100.0)
Monocytes Absolute: 1.1 10*3/uL — ABNORMAL HIGH (ref 0.1–1.0)
Monocytes Relative: 25 %
Neutro Abs: 2.6 10*3/uL (ref 1.7–7.7)
Neutrophils Relative %: 60 %
Platelet Count: 138 10*3/uL — ABNORMAL LOW (ref 150–400)
RBC: 3.15 MIL/uL — ABNORMAL LOW (ref 4.22–5.81)
RDW: 17.8 % — ABNORMAL HIGH (ref 11.5–15.5)
WBC Count: 4.3 10*3/uL (ref 4.0–10.5)
nRBC: 19.1 % — ABNORMAL HIGH (ref 0.0–0.2)

## 2019-03-08 MED ORDER — PROCHLORPERAZINE MALEATE 10 MG PO TABS: 10.0000 mg | ORAL_TABLET | Freq: Once | ORAL | Status: DC

## 2019-03-08 MED ORDER — BORTEZOMIB CHEMO SQ INJECTION 3.5 MG (2.5MG/ML)
1.5000 mg/m2 | Freq: Once | INTRAMUSCULAR | Status: AC
  Administered 2019-03-08: 3.25 mg via SUBCUTANEOUS
  Filled 2019-03-08: qty 1.3

## 2019-03-08 NOTE — Patient Instructions (Signed)
[facility] Discharge Instructions for Patients Receiving Chemotherapy  Today you received the following chemotherapy agents velcade   To help prevent nausea and vomiting after your treatment, we encourage you to take your nausea medication as directed  If you develop nausea and vomiting that is not controlled by your nausea medication, call the clinic.   BELOW ARE SYMPTOMS THAT SHOULD BE REPORTED IMMEDIATELY:  *FEVER GREATER THAN 100.5 F  *CHILLS WITH OR WITHOUT FEVER  NAUSEA AND VOMITING THAT IS NOT CONTROLLED WITH YOUR NAUSEA MEDICATION  *UNUSUAL SHORTNESS OF BREATH  *UNUSUAL BRUISING OR BLEEDING  TENDERNESS IN MOUTH AND THROAT WITH OR WITHOUT PRESENCE OF ULCERS  *URINARY PROBLEMS  *BOWEL PROBLEMS  UNUSUAL RASH Items with * indicate a potential emergency and should be followed up as soon as possible.  Feel free to call the clinic you have any questions or concerns. The clinic phone number is [phone].

## 2019-03-08 NOTE — Telephone Encounter (Signed)
Received call report from [name].  "Today's Creat = 6.55."  Routing phone encounter with results.

## 2019-03-09 DIAGNOSIS — D509 Iron deficiency anemia, unspecified: Secondary | ICD-10-CM | POA: Diagnosis not present

## 2019-03-09 DIAGNOSIS — Z992 Dependence on renal dialysis: Secondary | ICD-10-CM | POA: Diagnosis not present

## 2019-03-09 DIAGNOSIS — E1129 Type 2 diabetes mellitus with other diabetic kidney complication: Secondary | ICD-10-CM | POA: Diagnosis not present

## 2019-03-09 DIAGNOSIS — D631 Anemia in chronic kidney disease: Secondary | ICD-10-CM | POA: Diagnosis not present

## 2019-03-09 DIAGNOSIS — N2581 Secondary hyperparathyroidism of renal origin: Secondary | ICD-10-CM | POA: Diagnosis not present

## 2019-03-09 DIAGNOSIS — E1122 Type 2 diabetes mellitus with diabetic chronic kidney disease: Secondary | ICD-10-CM | POA: Diagnosis not present

## 2019-03-09 DIAGNOSIS — N186 End stage renal disease: Secondary | ICD-10-CM | POA: Diagnosis not present

## 2019-03-11 DIAGNOSIS — N2581 Secondary hyperparathyroidism of renal origin: Secondary | ICD-10-CM | POA: Diagnosis not present

## 2019-03-11 DIAGNOSIS — D631 Anemia in chronic kidney disease: Secondary | ICD-10-CM | POA: Diagnosis not present

## 2019-03-11 DIAGNOSIS — E1129 Type 2 diabetes mellitus with other diabetic kidney complication: Secondary | ICD-10-CM | POA: Diagnosis not present

## 2019-03-11 DIAGNOSIS — D509 Iron deficiency anemia, unspecified: Secondary | ICD-10-CM | POA: Diagnosis not present

## 2019-03-11 DIAGNOSIS — N186 End stage renal disease: Secondary | ICD-10-CM | POA: Diagnosis not present

## 2019-03-14 DIAGNOSIS — N2581 Secondary hyperparathyroidism of renal origin: Secondary | ICD-10-CM | POA: Diagnosis not present

## 2019-03-14 DIAGNOSIS — N186 End stage renal disease: Secondary | ICD-10-CM | POA: Diagnosis not present

## 2019-03-14 DIAGNOSIS — D509 Iron deficiency anemia, unspecified: Secondary | ICD-10-CM | POA: Diagnosis not present

## 2019-03-14 DIAGNOSIS — D631 Anemia in chronic kidney disease: Secondary | ICD-10-CM | POA: Diagnosis not present

## 2019-03-14 DIAGNOSIS — E1129 Type 2 diabetes mellitus with other diabetic kidney complication: Secondary | ICD-10-CM | POA: Diagnosis not present

## 2019-03-15 ENCOUNTER — Inpatient Hospital Stay: Payer: Medicare Other

## 2019-03-15 ENCOUNTER — Other Ambulatory Visit: Payer: Self-pay

## 2019-03-15 ENCOUNTER — Inpatient Hospital Stay: Payer: Medicare Other | Attending: Oncology

## 2019-03-15 VITALS — BP 151/69 | HR 70 | Temp 99.1°F | Resp 18

## 2019-03-15 DIAGNOSIS — C9000 Multiple myeloma not having achieved remission: Secondary | ICD-10-CM | POA: Insufficient documentation

## 2019-03-15 DIAGNOSIS — Z5112 Encounter for antineoplastic immunotherapy: Secondary | ICD-10-CM | POA: Insufficient documentation

## 2019-03-15 LAB — CMP (CANCER CENTER ONLY)
ALT: 7 U/L (ref 0–44)
AST: 13 U/L — ABNORMAL LOW (ref 15–41)
Albumin: 3.5 g/dL (ref 3.5–5.0)
Alkaline Phosphatase: 69 U/L (ref 38–126)
Anion gap: 11 (ref 5–15)
BUN: 27 mg/dL — ABNORMAL HIGH (ref 6–20)
CO2: 33 mmol/L — ABNORMAL HIGH (ref 22–32)
Calcium: 8.9 mg/dL (ref 8.9–10.3)
Chloride: 96 mmol/L — ABNORMAL LOW (ref 98–111)
Creatinine: 7.45 mg/dL (ref 0.61–1.24)
GFR, Est AFR Am: 9 mL/min — ABNORMAL LOW (ref >60)
GFR, Estimated: 7 mL/min — ABNORMAL LOW (ref >60)
Glucose, Bld: 125 mg/dL — ABNORMAL HIGH (ref 70–99)
Potassium: 3.8 mmol/L (ref 3.5–5.1)
Sodium: 140 mmol/L (ref 135–145)
Total Bilirubin: 0.9 mg/dL (ref 0.3–1.2)
Total Protein: 6.4 g/dL — ABNORMAL LOW (ref 6.5–8.1)

## 2019-03-15 LAB — CBC WITH DIFFERENTIAL (CANCER CENTER ONLY)
Abs Immature Granulocytes: 0.01 10*3/uL (ref 0.00–0.07)
Basophils Absolute: 0.1 10*3/uL (ref 0.0–0.1)
Basophils Relative: 2 %
Eosinophils Absolute: 0.6 10*3/uL — ABNORMAL HIGH (ref 0.0–0.5)
Eosinophils Relative: 14 %
HCT: 26.7 % — ABNORMAL LOW (ref 39.0–52.0)
Hemoglobin: 10 g/dL — ABNORMAL LOW (ref 13.0–17.0)
Immature Granulocytes: 0 %
Lymphocytes Relative: 0 %
Lymphs Abs: 0 10*3/uL — ABNORMAL LOW (ref 0.7–4.0)
MCH: 32.2 pg (ref 26.0–34.0)
MCHC: 37.5 g/dL — ABNORMAL HIGH (ref 30.0–36.0)
MCV: 85.9 fL (ref 80.0–100.0)
Monocytes Absolute: 1 10*3/uL (ref 0.1–1.0)
Monocytes Relative: 22 %
Neutro Abs: 2.8 10*3/uL (ref 1.7–7.7)
Neutrophils Relative %: 62 %
Platelet Count: 141 10*3/uL — ABNORMAL LOW (ref 150–400)
RBC: 3.11 MIL/uL — ABNORMAL LOW (ref 4.22–5.81)
RDW: 18 % — ABNORMAL HIGH (ref 11.5–15.5)
WBC Count: 4.5 10*3/uL (ref 4.0–10.5)
nRBC: 17.6 % — ABNORMAL HIGH (ref 0.0–0.2)

## 2019-03-15 MED ORDER — PROCHLORPERAZINE MALEATE 10 MG PO TABS: 10.0000 mg | ORAL_TABLET | Freq: Once | ORAL | Status: DC

## 2019-03-15 MED ORDER — BORTEZOMIB CHEMO SQ INJECTION 3.5 MG (2.5MG/ML)
1.5000 mg/m2 | Freq: Once | INTRAMUSCULAR | Status: AC
  Administered 2019-03-15: 3.25 mg via SUBCUTANEOUS
  Filled 2019-03-15: qty 1.3

## 2019-03-15 NOTE — Patient Instructions (Signed)
[facility] Discharge Instructions for Patients Receiving Chemotherapy  Today you received the following chemotherapy agents velcade   To help prevent nausea and vomiting after your treatment, we encourage you to take your nausea medication as directed  If you develop nausea and vomiting that is not controlled by your nausea medication, call the clinic.   BELOW ARE SYMPTOMS THAT SHOULD BE REPORTED IMMEDIATELY:  *FEVER GREATER THAN 100.5 F  *CHILLS WITH OR WITHOUT FEVER  NAUSEA AND VOMITING THAT IS NOT CONTROLLED WITH YOUR NAUSEA MEDICATION  *UNUSUAL SHORTNESS OF BREATH  *UNUSUAL BRUISING OR BLEEDING  TENDERNESS IN MOUTH AND THROAT WITH OR WITHOUT PRESENCE OF ULCERS  *URINARY PROBLEMS  *BOWEL PROBLEMS  UNUSUAL RASH Items with * indicate a potential emergency and should be followed up as soon as possible.  Feel free to call the clinic you have any questions or concerns. The clinic phone number is [phone].

## 2019-03-16 DIAGNOSIS — D631 Anemia in chronic kidney disease: Secondary | ICD-10-CM | POA: Diagnosis not present

## 2019-03-16 DIAGNOSIS — D509 Iron deficiency anemia, unspecified: Secondary | ICD-10-CM | POA: Diagnosis not present

## 2019-03-16 DIAGNOSIS — N186 End stage renal disease: Secondary | ICD-10-CM | POA: Diagnosis not present

## 2019-03-16 DIAGNOSIS — N2581 Secondary hyperparathyroidism of renal origin: Secondary | ICD-10-CM | POA: Diagnosis not present

## 2019-03-16 DIAGNOSIS — E1129 Type 2 diabetes mellitus with other diabetic kidney complication: Secondary | ICD-10-CM | POA: Diagnosis not present

## 2019-03-17 ENCOUNTER — Ambulatory Visit (INDEPENDENT_AMBULATORY_CARE_PROVIDER_SITE_OTHER): Payer: Medicare Other | Admitting: Internal Medicine

## 2019-03-17 ENCOUNTER — Ambulatory Visit (INDEPENDENT_AMBULATORY_CARE_PROVIDER_SITE_OTHER): Payer: Medicare Other

## 2019-03-17 ENCOUNTER — Encounter: Payer: Self-pay | Admitting: Internal Medicine

## 2019-03-17 ENCOUNTER — Other Ambulatory Visit: Payer: Self-pay

## 2019-03-17 VITALS — BP 132/70 | HR 65 | Temp 98.5°F | Ht 65.0 in | Wt 203.4 lb

## 2019-03-17 VITALS — BP 132/70 | HR 78 | Temp 98.5°F | Ht 65.0 in | Wt 203.4 lb

## 2019-03-17 DIAGNOSIS — Z992 Dependence on renal dialysis: Secondary | ICD-10-CM | POA: Diagnosis not present

## 2019-03-17 DIAGNOSIS — E6609 Other obesity due to excess calories: Secondary | ICD-10-CM

## 2019-03-17 DIAGNOSIS — H6123 Impacted cerumen, bilateral: Secondary | ICD-10-CM | POA: Diagnosis not present

## 2019-03-17 DIAGNOSIS — Z6833 Body mass index (BMI) 33.0-33.9, adult: Secondary | ICD-10-CM | POA: Diagnosis not present

## 2019-03-17 DIAGNOSIS — N186 End stage renal disease: Secondary | ICD-10-CM | POA: Diagnosis not present

## 2019-03-17 DIAGNOSIS — E1122 Type 2 diabetes mellitus with diabetic chronic kidney disease: Secondary | ICD-10-CM

## 2019-03-17 DIAGNOSIS — Z0000 Encounter for general adult medical examination without abnormal findings: Secondary | ICD-10-CM

## 2019-03-17 DIAGNOSIS — I120 Hypertensive chronic kidney disease with stage 5 chronic kidney disease or end stage renal disease: Secondary | ICD-10-CM

## 2019-03-17 NOTE — Progress Notes (Signed)
Subjective:   Isaiah [name] is a 53 y.o. male who presents for Medicare Annual/Subsequent preventive examination.  Review of Systems:  n/a Cardiac Risk Factors include: advanced age (>2mn, >>42women);male gender;diabetes mellitus;sedentary lifestyle;obesity (BMI >30kg/m2);hypertension     Objective:    Vitals: BP 132/70 (BP Location: Left Arm, Patient Position: Sitting, Cuff Size: Normal)   Pulse 78   Temp 98.5 F (36.9 C) (Oral)   Ht 5' 5"  (1.651 m)   Wt 203 lb 6.4 oz (92.3 kg)   SpO2 93%   BMI 33.85 kg/m   Body mass index is 33.85 kg/m.  Advanced Directives 03/17/2019 12/08/2017 09/28/2017 07/05/2017 01/22/2017 12/02/2016 11/13/2016  Does Patient Have a Medical Advance Directive? Yes Yes Yes No No;Yes Yes Yes  Type of AParamedicof AMonroeLiving will [place]inaLiving will - - Living will;Healthcare Power of [place]tonLiving will [place]Living will  Does patient want to make changes to medical advance directive? No - Patient declined - - - - - No - Patient declined  Copy of HMidwayin Chart? No - copy requested No - copy requested - - - Yes Yes  Would patient like information on creating a medical advance directive? - - No - Patient declined - - - -  Pre-existing out of facility DNR order (yellow form or pink MOST form) - - - - - - -    Tobacco Social History   Tobacco Use  Smoking Status Never Smoker  Smokeless Tobacco Never Used     Counseling given: Not Answered   Clinical Intake:  Pre-visit preparation completed: Yes  Pain : No/denies pain     Nutritional Status: BMI > 30  Obese Nutritional Risks: None Diabetes: Yes CBG done?: No Did pt. bring in CBG monitor from home?: No  How often do you need to have someone help you when you read instructions, pamphlets, or other written materials from your doctor or pharmacy?: 1 - Never What is the last  grade level you completed in school?: master's degree  Interpreter Needed?: No  Information entered by :: NAllen LPN  Past Medical History:  Diagnosis Date  . A-fib ([facility]   . Acute renal failure ([facility] 07/2011  . Anemia   . Asthma   . Diabetes mellitus without complication ([facility]   . ESRD (end stage renal disease) on dialysis ([facility]   . Gout   . History of recent blood transfusion 10/27/14   2 Units PRBC's  . Hyperkalemia 07/2011  . Hypertension   . Monoclonal gammopathies   . Monoclonal gammopathy   . OSA on CPAP   . Pulmonary embolism ([facility] 07/2011   a. Tx with Coumadin for 6 months (unknown cause per patient).  . Pulmonary embolism ([facility] 07/2011; 09/27/2014   a. Bilat PE 07/2011 - unclear cause, tx with 6 months Coumadin.;   . Seizures ([facility]   . Sickle cell-thalassemia disease ([facility]    a. Sickle cell trait.  . Sleep apnea    a. uses CPAP.  .Marland KitchenStroke ([facility]    Past Surgical History:  Procedure Laterality Date  . AV FISTULA PLACEMENT Right 12/05/2015   Procedure: INSERTION OF ARTERIOVENOUS (AV) GORE-TEX GRAFT ARM;  Surgeon: CElam Dutch MD;  Location: [facility]  Service: Vascular;  Laterality: Right;  . BONE MARROW BIOPSY    . CHOLECYSTECTOMY    . CHOLECYSTECTOMY  1990's?  . COLONOSCOPY WITH PROPOFOL N/A 01/22/2017  Procedure: COLONOSCOPY WITH PROPOFOL;  Surgeon: Wilford Corner, MD;  Location: [facility] END[facility]COPY;  Service: Endoscopy;  Laterality: N/A;  . EYE SURGERY Right   . INSERTION OF DIALYSIS CATHETER Right 10/16/2015   Procedure: INSERTION OF PALINDROME DIALYSIS CATHETER ;  Surgeon: Elam Dutch, MD;  Location: [facility];  Service: Vascular;  Laterality: Right;  . OTHER SURGICAL HISTORY     Retinal surgery  . PARS PLANA VITRECTOMY  02/17/2012   Procedure: PARS PLANA VITRECTOMY WITH 25 GAUGE;  Surgeon: Hayden Pedro, MD;  Location: [facility];  Service: Ophthalmology;  Laterality: Right;  . PERIPHERAL VASCULAR CATHETERIZATION N/A 03/20/2016   Procedure: A/V  Shuntogram/Fistulagram;  Surgeon: Conrad Stonewall, MD;  Location: [facility] CV LAB;  Service: Cardiovascular;  Laterality: N/A;  . TEE WITHOUT CARDIOVERSION N/A 12/02/2016   Procedure: TRANSESOPHAGEAL ECHOCARDIOGRAM (TEE);  Surgeon: Larey Dresser, MD;  Location: [name] ENDOSCOPY;  Service: Cardiovascular;  Laterality: N/A;   Family History  Problem Relation Age of Onset  . Hypertension Mother   . Diabetes Mother   . Stroke Father   . Sickle cell anemia Brother   . Diabetes type I Brother   . Kidney disease Brother    Social History   Socioeconomic History  . Marital status: Married    Spouse name: sylvia  . Number of children: 1  . Years of education: college  . Highest education level: Not on file  Occupational History  . Occupation: [employer]  . Occupation: disability  Social Needs  . Financial resource strain: Not hard at all  . Food insecurity    Worry: Never true    Inability: Never true  . Transportation needs    Medical: No    Non-medical: No  Tobacco Use  . Smoking status: Never Smoker  . Smokeless tobacco: Never Used  Substance and Sexual Activity  . Alcohol use: No    Alcohol/week: 0.0 standard drinks  . Drug use: No  . Sexual activity: Not Currently  Lifestyle  . Physical activity    Days per week: 0 days    Minutes per session: 0 min  . Stress: Not at all  Relationships  . Social Herbalist on phone: Not on file    Gets together: Not on file    Attends religious service: Not on file    Active member of club or organization: Not on file    Attends meetings of clubs or organizations: Not on file    Relationship status: Not on file  Other Topics Concern  . Not on file  Social History Narrative   Pt lives in 2 story home with his wife and 1 son   Has masters degree in psychology   Currently disabled.        Outpatient Encounter Medications as of 03/17/2019  Medication Sig  . acyclovir (ZOVIRAX) 400 MG tablet TAKE 1 TABLET (400  MG TOTAL) BY MOUTH DAILY.  [name] albuterol (PROAIR HFA) 108 (90 Base) MCG/ACT inhaler Inhale two puffs every 4-6 hours if needed for cough or wheeze  . aspirin EC 81 MG tablet Take 1 tablet (81 mg total) by mouth daily.  . bortezomib SQ (VELCADE) 3.5 MG SOLR Inject 1.3 mg/m2 into the skin once a week.  [name] BREO ELLIPTA 200-25 MCG/INH AEPB INHALE 1 PUFF BY MOUTH AT THE SAME TIME EVERY DAY  . calcium acetate (PHOSLO) 667 MG capsule Take 2,001 mg by mouth 3 (three) times daily with meals.   [name]  Cetirizine HCl 10 MG CAPS Take 1 capsule by mouth daily.  . cinacalcet (SENSIPAR) 60 MG tablet Take 60 mg by mouth every other day.   . clonazePAM (KLONOPIN) 0.5 MG tablet TAKE 1 TABLET BY MOUTH PRIOR TO EACH DIALYSIS SESSION  . cyclophosphamide (CYTOXAN) 50 MG capsule TAKE 13 CAPSULES (650MG) BY MOUTH ONCE WEEKLY, TAKE EARLY IN THE DAY  . dexamethasone (DECADRON) 4 MG tablet Take 5 tablets weekly  . fluticasone furoate-vilanterol (BREO ELLIPTA) 100-25 MCG/INH AEPB Inhale one puff once daily to prevent cough or wheeze  . insulin lispro (HUMALOG) 100 UNIT/ML injection Inject into the skin 3 (three) times daily before meals. 3 to 4 units TID prn SS  . Lacosamide (VIMPAT) 150 MG TABS TAKE 1 TABLET BY MOUTH TWICE DAILY TAKE AN ADDITIONAL 1 TABLET AFTER DIALYSIS SESSION THREE TIMES A WEEK.  [name] lamoTRIgine (LAMICTAL) 150 MG tablet Take 1 tablet (150 mg total) by mouth 2 (two) times daily.  . metoprolol tartrate (LOPRESSOR) 25 MG tablet Take 0.5 tablets (12.5 mg total) by mouth 2 (two) times daily.  . midodrine (PROAMATINE) 10 MG tablet Take 10 mg by mouth 2 (two) times daily with a meal.   . multivitamin (RENA-VIT) TABS tablet Take 1 tablet by mouth at bedtime.  . pantoprazole (PROTONIX) 40 MG tablet TAKE 1 TABLET BY MOUTH ONCE DAILY  . prochlorperazine (COMPAZINE) 10 MG tablet TAKE 1 TABLET (10 MG TOTAL) BY MOUTH EVERY 6 (SIX) HOURS AS NEEDED FOR NAUSEA OR VOMITING.   No facility-administered encounter medications on file  as of 03/17/2019.     Activities of Daily Living In your present state of health, do you have any difficulty performing the following activities: 03/17/2019  Hearing? N  Vision? N  Difficulty concentrating or making decisions? Y  Comment sometimes  Walking or climbing stairs? Y  Comment sometimes  Dressing or bathing? N  Doing errands, shopping? Y  Comment has someone with  Preparing Food and eating ? Y  Using the Toilet? N  In the past six months, have you accidently leaked urine? N  Do you have problems with loss of bowel control? N  Managing your Medications? Y  Comment has assistance  Managing your Finances? N  Housekeeping or managing your Housekeeping? Y  Some recent data might be hidden    Patient Care Team: [name], MD as PCP - General (Internal Medicine) Estanislado Emms, MD as Consulting Physician (Nephrology) Carlean Jews, [facility] (Inactive) as [facility] (Pharmacist) Marica Otter, OD (Optometry) Hayden Pedro, MD as Consulting Physician (Ophthalmology)   Assessment:   This is a routine wellness examination for [place].  Exercise Activities and Dietary recommendations Current Exercise Habits: The patient does not participate in regular exercise at present  Goals    . Patient Stated     No goals       Fall Risk Fall Risk  03/17/2019 04/29/2018 10/29/2017 10/29/2017 01/29/2017  Falls in the past year? 0 No No No No  Risk for fall due to : Medication side effect - - - -  Follow up Falls evaluation completed;Education provided;Falls prevention discussed - - - -   Is the patient's home free of loose throw rugs in walkways, pet beds, electrical cords, etc?   yes      Grab bars in the bathroom? yes      Handrails on the stairs?   yes      Adequate lighting?   yes  Timed Get Up  and Go Performed: n/a  Depression Screen PHQ 2/9 Scores 03/17/2019 08/19/2018 10/09/2016 09/18/2016  PHQ - 2 Score 0 0 0 0  PHQ- 9 Score 1 - - -     Cognitive Function     6CIT Screen 03/17/2019  What Year? 0 points  What month? 0 points  What time? 0 points  Count back from 20 0 points  Months in reverse 4 points  Repeat phrase 2 points  Total Score 6    Immunization History  Administered Date(s) Administered  . Influenza Split 10/13/2015  . Influenza,inj,Quad PF,6+ Mos 10/13/2015  . Influenza-Unspecified 07/09/2014, 06/08/2018    Qualifies for Shingles Vaccine? yes  Screening Tests Health Maintenance  Topic Date Due  . FOOT EXAM  11/22/1973  . OPHTHALMOLOGY EXAM  11/22/1973  . URINE MICROALBUMIN  11/22/1973  . HIV Screening  11/23/1978  . HEMOGLOBIN A1C  07/07/2016  . PNEUMOCOCCAL POLYSACCHARIDE VACCINE AGE 43-64 HIGH RISK  03/16/2020 (Originally [birth year]/22/1965)  . TETANUS/TDAP  03/16/2020 (Originally 11/23/1982)  . Hepatitis C Screening  03/16/2020 (Originally Jun 16, 1966)  . INFLUENZA VACCINE  04/09/2019  . COLONOSCOPY  01/23/2027   Cancer Screenings: Lung: Low Dose CT Chest recommended if Age 42-80 years, 30 pack-year currently smoking OR have quit w/in 15years. Patient does not qualify. Colorectal: up to date  Additional Screenings:  Hepatitis C Screening:states had at dialysis      Plan:    6 CIT was 6. No goals set.  I have personally reviewed and noted the following in the patient's chart:   . Medical and social history . Use of alcohol, tobacco or illicit drugs  . Current medications and supplements . Functional ability and status . Nutritional status . Physical activity . Advanced directives . List of other physicians . Hospitalizations, surgeries, and ER visits in previous 12 months . Vitals . Screenings to include cognitive, depression, and falls . Referrals and appointments  In addition, I have reviewed and discussed with patient certain preventive protocols, quality metrics, and best practice recommendations. A written personalized care plan for preventive services as well as general preventive  health recommendations were provided to patient.     Kellie Simmering, LPN  [id number]/04/8109

## 2019-03-17 NOTE — Patient Instructions (Signed)
Isaiah [name] , Thank you for taking time to come for your Medicare Wellness Visit. I appreciate your ongoing commitment to your health goals. Please review the following plan we discussed and let me know if I can assist you in the future.   Screening recommendations/referrals: Colonoscopy: 01/2017 Recommended yearly ophthalmology/optometry visit for glaucoma screening and checkup Recommended yearly dental visit for hygiene and checkup  Vaccinations: Influenza vaccine: 06/2018 Pneumococcal vaccine: believes had at dialysis Tdap vaccine: believes had at dialysis Shingles vaccine: discussed    Advanced directives: Please bring a copy of your POA (Power of Attorney) and/or Living Will to your next appointment.    Conditions/risks identified: obesity  Next appointment: 03/29/2020 at 10:30  Preventive Care 40-64 Years, Male Preventive care refers to lifestyle choices and visits with your health care provider that can promote health and wellness. What does preventive care include?  A yearly physical exam. This is also called an annual well check.  Dental exams once or twice a year.  Routine eye exams. Ask your health care provider how often you should have your eyes checked.  Personal lifestyle choices, including:  Daily care of your teeth and gums.  Regular physical activity.  Eating a healthy diet.  Avoiding tobacco and drug use.  Limiting alcohol use.  Practicing safe sex.  Taking low-dose aspirin every day starting at age 15. What happens during an annual well check? The services and screenings done by your health care provider during your annual well check will depend on your age, overall health, lifestyle risk factors, and family history of disease. Counseling  Your health care provider may ask you questions about your:  Alcohol use.  Tobacco use.  Drug use.  Emotional well-being.  Home and relationship well-being.  Sexual activity.  Eating habits.  Work and  work Statistician. Screening  You may have the following tests or measurements:  Height, weight, and BMI.  Blood pressure.  Lipid and cholesterol levels. These may be checked every 5 years, or more frequently if you are over 53 years old.  Skin check.  Lung cancer screening. You may have this screening every year starting at age 35 if you have a 30-pack-year history of smoking and currently smoke or have quit within the past 15 years.  Fecal occult blood test (FOBT) of the stool. You may have this test every year starting at age 51.  Flexible sigmoidoscopy or colonoscopy. You may have a sigmoidoscopy every 5 years or a colonoscopy every 10 years starting at age 61.  Prostate cancer screening. Recommendations will vary depending on your family history and other risks.  Hepatitis C blood test.  Hepatitis B blood test.  Sexually transmitted disease (STD) testing.  Diabetes screening. This is done by checking your blood sugar (glucose) after you have not eaten for a while (fasting). You may have this done every 1-3 years. Discuss your test results, treatment options, and if necessary, the need for more tests with your health care provider. Vaccines  Your health care provider may recommend certain vaccines, such as:  Influenza vaccine. This is recommended every year.  Tetanus, diphtheria, and acellular pertussis (Tdap, Td) vaccine. You may need a Td booster every 10 years.  Zoster vaccine. You may need this after age 57.  Pneumococcal 13-valent conjugate (PCV13) vaccine. You may need this if you have certain conditions and have not been vaccinated.  Pneumococcal polysaccharide (PPSV23) vaccine. You may need one or two doses if you smoke cigarettes or if you have certain  conditions. Talk to your health care provider about which screenings and vaccines you need and how often you need them. This information is not intended to replace advice given to you by your health care provider.  Make sure you discuss any questions you have with your health care provider. Document Released: 09/21/2015 Document Revised: 05/14/2016 Document Reviewed: 06/26/2015 Elsevier Interactive Patient Education  2017 [employer] Prevention in the Home Falls can cause injuries. They can happen to people of all ages. There are many things you can do to make your home safe and to help prevent falls. What can I do on the outside of my home?  Regularly fix the edges of walkways and driveways and fix any cracks.  Remove anything that might make you trip as you walk through a door, such as a raised step or threshold.  Trim any bushes or trees on the path to your home.  Use bright outdoor lighting.  Clear any walking paths of anything that might make someone trip, such as rocks or tools.  Regularly check to see if handrails are loose or broken. Make sure that both sides of any steps have handrails.  Any raised decks and porches should have guardrails on the edges.  Have any leaves, snow, or ice cleared regularly.  Use sand or salt on walking paths during winter.  Clean up any spills in your garage right away. This includes oil or grease spills. What can I do in the bathroom?  Use night lights.  Install grab bars by the toilet and in the tub and shower. Do not use towel bars as grab bars.  Use non-skid mats or decals in the tub or shower.  If you need to sit down in the shower, use a plastic, non-slip stool.  Keep the floor dry. Clean up any water that spills on the floor as soon as it happens.  Remove soap buildup in the tub or shower regularly.  Attach bath mats securely with double-sided non-slip rug tape.  Do not have throw rugs and other things on the floor that can make you trip. What can I do in the bedroom?  Use night lights.  Make sure that you have a light by your bed that is easy to reach.  Do not use any sheets or blankets that are too big for your bed. They  should not hang down onto the floor.  Have a firm chair that has side arms. You can use this for support while you get dressed.  Do not have throw rugs and other things on the floor that can make you trip. What can I do in the kitchen?  Clean up any spills right away.  Avoid walking on wet floors.  Keep items that you use a lot in easy-to-reach places.  If you need to reach something above you, use a strong step stool that has a grab bar.  Keep electrical cords out of the way.  Do not use floor polish or wax that makes floors slippery. If you must use wax, use non-skid floor wax.  Do not have throw rugs and other things on the floor that can make you trip. What can I do with my stairs?  Do not leave any items on the stairs.  Make sure that there are handrails on both sides of the stairs and use them. Fix handrails that are broken or loose. Make sure that handrails are as long as the stairways.  Check any carpeting to make  sure that it is firmly attached to the stairs. Fix any carpet that is loose or worn.  Avoid having throw rugs at the top or bottom of the stairs. If you do have throw rugs, attach them to the floor with carpet tape.  Make sure that you have a light switch at the top of the stairs and the bottom of the stairs. If you do not have them, ask someone to add them for you. What else can I do to help prevent falls?  Wear shoes that:  Do not have high heels.  Have rubber bottoms.  Are comfortable and fit you well.  Are closed at the toe. Do not wear sandals.  If you use a stepladder:  Make sure that it is fully opened. Do not climb a closed stepladder.  Make sure that both sides of the stepladder are locked into place.  Ask someone to hold it for you, if possible.  Clearly mark and make sure that you can see:  Any grab bars or handrails.  First and last steps.  Where the edge of each step is.  Use tools that help you move around (mobility aids) if  they are needed. These include:  Canes.  Walkers.  Scooters.  Crutches.  Turn on the lights when you go into a dark area. Replace any light bulbs as soon as they burn out.  Set up your furniture so you have a clear path. Avoid moving your furniture around.  If any of your floors are uneven, fix them.  If there are any pets around you, be aware of where they are.  Review your medicines with your doctor. Some medicines can make you feel dizzy. This can increase your chance of falling. Ask your doctor what other things that you can do to help prevent falls. This information is not intended to replace advice given to you by your health care provider. Make sure you discuss any questions you have with your health care provider. Document Released: 06/21/2009 Document Revised: 01/31/2016 Document Reviewed: 09/29/2014 Elsevier Interactive Patient Education  2017 [employer].

## 2019-03-17 NOTE — Patient Instructions (Signed)

## 2019-03-18 DIAGNOSIS — D631 Anemia in chronic kidney disease: Secondary | ICD-10-CM | POA: Diagnosis not present

## 2019-03-18 DIAGNOSIS — N2581 Secondary hyperparathyroidism of renal origin: Secondary | ICD-10-CM | POA: Diagnosis not present

## 2019-03-18 DIAGNOSIS — N186 End stage renal disease: Secondary | ICD-10-CM | POA: Diagnosis not present

## 2019-03-18 DIAGNOSIS — E1129 Type 2 diabetes mellitus with other diabetic kidney complication: Secondary | ICD-10-CM | POA: Diagnosis not present

## 2019-03-18 DIAGNOSIS — D509 Iron deficiency anemia, unspecified: Secondary | ICD-10-CM | POA: Diagnosis not present

## 2019-03-18 LAB — LIPID PANEL
Chol/HDL Ratio: 2.7 ratio (ref 0.0–5.0)
Cholesterol, Total: 169 mg/dL (ref 100–199)
HDL: 62 mg/dL (ref >39)
LDL Calculated: 89 mg/dL (ref 0–99)
Triglycerides: 91 mg/dL (ref 0–149)
VLDL Cholesterol Cal: 18 mg/dL (ref 5–40)

## 2019-03-18 LAB — HEMOGLOBIN A1C
Est. average glucose Bld gHb Est-mCnc: 111 mg/dL
Hgb A1c MFr Bld: 5.5 % (ref 4.8–5.6)

## 2019-03-19 LAB — PSA: Prostate Specific Ag, Serum: 1.1 ng/mL (ref 0.0–4.0)

## 2019-03-19 LAB — SPECIMEN STATUS REPORT

## 2019-03-21 DIAGNOSIS — D631 Anemia in chronic kidney disease: Secondary | ICD-10-CM | POA: Diagnosis not present

## 2019-03-21 DIAGNOSIS — E1129 Type 2 diabetes mellitus with other diabetic kidney complication: Secondary | ICD-10-CM | POA: Diagnosis not present

## 2019-03-21 DIAGNOSIS — D509 Iron deficiency anemia, unspecified: Secondary | ICD-10-CM | POA: Diagnosis not present

## 2019-03-21 DIAGNOSIS — N2581 Secondary hyperparathyroidism of renal origin: Secondary | ICD-10-CM | POA: Diagnosis not present

## 2019-03-21 DIAGNOSIS — N186 End stage renal disease: Secondary | ICD-10-CM | POA: Diagnosis not present

## 2019-03-22 ENCOUNTER — Other Ambulatory Visit: Payer: Self-pay | Admitting: Oncology

## 2019-03-22 ENCOUNTER — Other Ambulatory Visit: Payer: Self-pay

## 2019-03-22 ENCOUNTER — Inpatient Hospital Stay: Payer: Medicare Other

## 2019-03-22 ENCOUNTER — Telehealth: Payer: Self-pay | Admitting: *Deleted

## 2019-03-22 VITALS — BP 143/63 | HR 69 | Temp 98.3°F | Resp 19 | Ht 65.0 in | Wt 206.2 lb

## 2019-03-22 DIAGNOSIS — C9000 Multiple myeloma not having achieved remission: Secondary | ICD-10-CM

## 2019-03-22 DIAGNOSIS — Z5112 Encounter for antineoplastic immunotherapy: Secondary | ICD-10-CM | POA: Diagnosis not present

## 2019-03-22 DIAGNOSIS — D472 Monoclonal gammopathy: Secondary | ICD-10-CM

## 2019-03-22 LAB — CBC WITH DIFFERENTIAL (CANCER CENTER ONLY)
Abs Immature Granulocytes: 0.02 10*3/uL (ref 0.00–0.07)
Basophils Absolute: 0.2 10*3/uL — ABNORMAL HIGH (ref 0.0–0.1)
Basophils Relative: 3 %
Eosinophils Absolute: 0.5 10*3/uL (ref 0.0–0.5)
Eosinophils Relative: 9 %
HCT: 28.2 % — ABNORMAL LOW (ref 39.0–52.0)
Hemoglobin: 10.3 g/dL — ABNORMAL LOW (ref 13.0–17.0)
Immature Granulocytes: 0 %
Lymphocytes Relative: 9 %
Lymphs Abs: 0.5 10*3/uL — ABNORMAL LOW (ref 0.7–4.0)
MCH: 32.2 pg (ref 26.0–34.0)
MCHC: 36.5 g/dL — ABNORMAL HIGH (ref 30.0–36.0)
MCV: 88.1 fL (ref 80.0–100.0)
Monocytes Absolute: 0.6 10*3/uL (ref 0.1–1.0)
Monocytes Relative: 12 %
Neutro Abs: 3.7 10*3/uL (ref 1.7–7.7)
Neutrophils Relative %: 67 %
Platelet Count: 168 10*3/uL (ref 150–400)
RBC: 3.2 MIL/uL — ABNORMAL LOW (ref 4.22–5.81)
RDW: 18.2 % — ABNORMAL HIGH (ref 11.5–15.5)
WBC Count: 5.5 10*3/uL (ref 4.0–10.5)
nRBC: 20.6 % — ABNORMAL HIGH (ref 0.0–0.2)

## 2019-03-22 LAB — CMP (CANCER CENTER ONLY)
ALT: 7 U/L (ref 0–44)
AST: 16 U/L (ref 15–41)
Albumin: 3.7 g/dL (ref 3.5–5.0)
Alkaline Phosphatase: 73 U/L (ref 38–126)
Anion gap: 12 (ref 5–15)
BUN: 22 mg/dL — ABNORMAL HIGH (ref 6–20)
CO2: 30 mmol/L (ref 22–32)
Calcium: 9 mg/dL (ref 8.9–10.3)
Chloride: 98 mmol/L (ref 98–111)
Creatinine: 6.35 mg/dL (ref 0.61–1.24)
GFR, Est AFR Am: 10 mL/min — ABNORMAL LOW (ref >60)
GFR, Estimated: 9 mL/min — ABNORMAL LOW (ref >60)
Glucose, Bld: 134 mg/dL — ABNORMAL HIGH (ref 70–99)
Potassium: 3.9 mmol/L (ref 3.5–5.1)
Sodium: 140 mmol/L (ref 135–145)
Total Bilirubin: 0.9 mg/dL (ref 0.3–1.2)
Total Protein: 7 g/dL (ref 6.5–8.1)

## 2019-03-22 MED ORDER — BORTEZOMIB CHEMO SQ INJECTION 3.5 MG (2.5MG/ML)
1.5000 mg/m2 | Freq: Once | INTRAMUSCULAR | Status: AC
  Administered 2019-03-22: 3.25 mg via SUBCUTANEOUS
  Filled 2019-03-22: qty 1.3

## 2019-03-22 MED ORDER — PROCHLORPERAZINE MALEATE 10 MG PO TABS: 10.0000 mg | ORAL_TABLET | Freq: Once | ORAL | Status: DC

## 2019-03-22 NOTE — Telephone Encounter (Signed)
Received call report from [facility].  "Today's Creat = 6.4."  Phone note delivered to provider with results.

## 2019-03-22 NOTE — Patient Instructions (Signed)
[place] Discharge Instructions for Patients Receiving Chemotherapy  Today you received the following chemotherapy agents: Bortezomib (Velcade)  To help prevent nausea and vomiting after your treatment, we encourage you to take your nausea medication as directed.   If you develop nausea and vomiting that is not controlled by your nausea medication, call the clinic.   BELOW ARE SYMPTOMS THAT SHOULD BE REPORTED IMMEDIATELY:  *FEVER GREATER THAN 100.5 F  *CHILLS WITH OR WITHOUT FEVER  NAUSEA AND VOMITING THAT IS NOT CONTROLLED WITH YOUR NAUSEA MEDICATION  *UNUSUAL SHORTNESS OF BREATH  *UNUSUAL BRUISING OR BLEEDING  TENDERNESS IN MOUTH AND THROAT WITH OR WITHOUT PRESENCE OF ULCERS  *URINARY PROBLEMS  *BOWEL PROBLEMS  UNUSUAL RASH Items with * indicate a potential emergency and should be followed up as soon as possible.  Feel free to call the clinic should you have any questions or concerns. The clinic phone number is [phone].  Please show the [place] at check-in to the Emergency Department and triage nurse.  Coronavirus (COVID-19) Are you at risk?  Are you at risk for the Coronavirus (COVID-19)?  To be considered HIGH RISK for Coronavirus (COVID-19), you have to meet the following criteria:  . Traveled to [place], [place], [place], [place] or [place]; or in the [place] to [place], [place], [place], or [place]; and have fever, cough, and shortness of breath within the last 2 weeks of travel OR . Been in close contact with a person diagnosed with COVID-19 within the last 2 weeks and have fever, cough, and shortness of breath . IF YOU DO NOT MEET THESE CRITERIA, YOU ARE CONSIDERED LOW RISK FOR COVID-19.  What to do if you are HIGH RISK for COVID-19?  [name] If you are having a medical emergency, call 911. . Seek medical care right away. Before you go to a doctor's office, urgent care or emergency department, call ahead and tell them  about your recent travel, contact with someone diagnosed with COVID-19, and your symptoms. You should receive instructions from your physician's office regarding next steps of care.  . When you arrive at healthcare provider, tell the healthcare staff immediately you have returned from visiting [place], [place], [place], [place] or [place]; or traveled in the [place] to [place], [place], [place], or [place]; in the last two weeks or you have been in close contact with a person diagnosed with COVID-19 in the last 2 weeks.   . Tell the health care staff about your symptoms: fever, cough and shortness of breath. . After you have been seen by a medical provider, you will be either: o Tested for (COVID-19) and discharged home on quarantine except to seek medical care if symptoms worsen, and asked to  - Stay home and avoid contact with others until you get your results (4-5 days)  - Avoid travel on public transportation if possible (such as bus, train, or airplane) or o Sent to the Emergency Department by EMS for evaluation, COVID-19 testing, and possible admission depending on your condition and test results.  What to do if you are LOW RISK for COVID-19?  Reduce your risk of any infection by using the same precautions used for avoiding the common cold or flu:  [name] Wash your hands often with soap and warm water for at least 20 seconds.  If soap and water are not readily available, use an alcohol-based hand sanitizer with at least 60% alcohol.  . If coughing or  sneezing, cover your mouth and nose by coughing or sneezing into the elbow areas of your shirt or coat, into a tissue or into your sleeve (not your hands). . Avoid shaking hands with others and consider head nods or verbal greetings only. . Avoid touching your eyes, nose, or mouth with unwashed hands.  . Avoid close contact with people who are sick. . Avoid places or events with large numbers of people in one location, like concerts or  sporting events. . Carefully consider travel plans you have or are making. . If you are planning any travel outside or inside the [place], visit the CDC's Travelers' Health webpage for the latest health notices. . If you have some symptoms but not all symptoms, continue to monitor at home and seek medical attention if your symptoms worsen. . If you are having a medical emergency, call 911.   [place] / e-Visit: [email or web address]         [facility]: [facility]: [phone]                   [facility]: [phone]

## 2019-03-22 NOTE — Telephone Encounter (Signed)
Noted  

## 2019-03-23 ENCOUNTER — Encounter: Payer: Self-pay | Admitting: Internal Medicine

## 2019-03-23 DIAGNOSIS — D631 Anemia in chronic kidney disease: Secondary | ICD-10-CM | POA: Diagnosis not present

## 2019-03-23 DIAGNOSIS — N186 End stage renal disease: Secondary | ICD-10-CM | POA: Diagnosis not present

## 2019-03-23 DIAGNOSIS — D509 Iron deficiency anemia, unspecified: Secondary | ICD-10-CM | POA: Diagnosis not present

## 2019-03-23 DIAGNOSIS — E1129 Type 2 diabetes mellitus with other diabetic kidney complication: Secondary | ICD-10-CM | POA: Diagnosis not present

## 2019-03-23 DIAGNOSIS — N2581 Secondary hyperparathyroidism of renal origin: Secondary | ICD-10-CM | POA: Diagnosis not present

## 2019-03-23 LAB — MULTIPLE MYELOMA PANEL, SERUM
Albumin SerPl Elph-Mcnc: 3.7 g/dL (ref 2.9–4.4)
Albumin/Glob SerPl: 1.5 (ref 0.7–1.7)
Alpha 1: 0.3 g/dL (ref 0.0–0.4)
Alpha2 Glob SerPl Elph-Mcnc: 0.7 g/dL (ref 0.4–1.0)
B-Globulin SerPl Elph-Mcnc: 0.6 g/dL — ABNORMAL LOW (ref 0.7–1.3)
Gamma Glob SerPl Elph-Mcnc: 1 g/dL (ref 0.4–1.8)
Globulin, Total: 2.5 g/dL (ref 2.2–3.9)
IgA: 34 mg/dL — ABNORMAL LOW (ref 90–386)
IgG (Immunoglobin G), Serum: 1194 mg/dL (ref 603–1613)
IgM (Immunoglobulin M), Srm: 13 mg/dL — ABNORMAL LOW (ref 20–172)
M Protein SerPl Elph-Mcnc: 0.2 g/dL — ABNORMAL HIGH
Total Protein ELP: 6.2 g/dL (ref 6.0–8.5)

## 2019-03-23 LAB — KAPPA/LAMBDA LIGHT CHAINS
Kappa free light chain: 168.7 mg/L — ABNORMAL HIGH (ref 3.3–19.4)
Kappa, lambda light chain ratio: 2.46 — ABNORMAL HIGH (ref 0.26–1.65)
Lambda free light chains: 68.6 mg/L — ABNORMAL HIGH (ref 5.7–26.3)

## 2019-03-25 ENCOUNTER — Encounter: Payer: Self-pay | Admitting: Internal Medicine

## 2019-03-25 DIAGNOSIS — N186 End stage renal disease: Secondary | ICD-10-CM | POA: Diagnosis not present

## 2019-03-25 DIAGNOSIS — D509 Iron deficiency anemia, unspecified: Secondary | ICD-10-CM | POA: Diagnosis not present

## 2019-03-25 DIAGNOSIS — N2581 Secondary hyperparathyroidism of renal origin: Secondary | ICD-10-CM | POA: Diagnosis not present

## 2019-03-25 DIAGNOSIS — D631 Anemia in chronic kidney disease: Secondary | ICD-10-CM | POA: Diagnosis not present

## 2019-03-25 DIAGNOSIS — E1129 Type 2 diabetes mellitus with other diabetic kidney complication: Secondary | ICD-10-CM | POA: Diagnosis not present

## 2019-03-27 NOTE — Progress Notes (Signed)
Subjective:     Patient ID: Isaiah [name] , male    DOB: [birth year] , 53 y.o.   MRN: [id number]   Chief Complaint  Patient presents with  . Annual Exam  . Hypertension    HPI  He is here today for a full physical examination. He is accompanied by his wife today.  He has no specific concerns or complaints at this time.   Hypertension This is a chronic problem. The current episode started more than 1 year ago. The problem has been gradually improving since onset. The problem is controlled. Pertinent negatives include no blurred vision, chest pain, palpitations or shortness of breath. Risk factors for coronary artery disease include dyslipidemia, obesity, male gender and sedentary lifestyle. Past treatments include beta blockers. The current treatment provides moderate improvement. Hypertensive end-organ damage includes kidney disease.     Past Medical History:  Diagnosis Date  . A-fib ([place])   . Acute renal failure ([place]) 07/2011  . Anemia   . Asthma   . Diabetes mellitus without complication ([place])   . ESRD (end stage renal disease) on dialysis ([place])   . Gout   . History of recent blood transfusion 10/27/14   2 Units PRBC's  . Hyperkalemia 07/2011  . Hypertension   . Monoclonal gammopathies   . Monoclonal gammopathy   . OSA on CPAP   . Pulmonary embolism ([place]) 07/2011   a. Tx with Coumadin for 6 months (unknown cause per patient).  . Pulmonary embolism ([place]) 07/2011; 09/27/2014   a. Bilat PE 07/2011 - unclear cause, tx with 6 months Coumadin.;   . Seizures ([place])   . Sickle cell-thalassemia disease ([place])    a. Sickle cell trait.  . Sleep apnea    a. uses CPAP.  [name] Stroke [facility])      Family History  Problem Relation Age of Onset  . Hypertension Mother   . Diabetes Mother   . Stroke Father   . Sickle cell anemia Brother   . Diabetes type I Brother   . Kidney disease Brother      Current Outpatient Medications:  .  acyclovir (ZOVIRAX) 400 MG tablet, TAKE 1 TABLET (400  MG TOTAL) BY MOUTH DAILY., Disp: 90 tablet, Rfl: 0 .  albuterol (PROAIR HFA) 108 (90 Base) MCG/ACT inhaler, Inhale two puffs every 4-6 hours if needed for cough or wheeze, Disp: 1 Inhaler, Rfl: 1 .  aspirin EC 81 MG tablet, Take 1 tablet (81 mg total) by mouth daily., Disp: 100 tablet, Rfl: 0 .  bortezomib SQ (VELCADE) 3.5 MG SOLR, Inject 1.3 mg/m2 into the skin once a week., Disp: , Rfl:  .  BREO ELLIPTA 200-25 MCG/INH AEPB, INHALE 1 PUFF BY MOUTH AT THE SAME TIME EVERY DAY, Disp: 60 each, Rfl: 2 .  calcium acetate (PHOSLO) 667 MG capsule, Take 2,001 mg by mouth 3 (three) times daily with meals. , Disp: , Rfl:  .  Cetirizine HCl 10 MG CAPS, Take 1 capsule by mouth daily., Disp: , Rfl:  .  cinacalcet (SENSIPAR) 60 MG tablet, Take 60 mg by mouth every other day. , Disp: , Rfl:  .  clonazePAM (KLONOPIN) 0.5 MG tablet, TAKE 1 TABLET BY MOUTH PRIOR TO EACH DIALYSIS SESSION, Disp: 45 tablet, Rfl: 3 .  cyclophosphamide (CYTOXAN) 50 MG capsule, TAKE 13 CAPSULES (650MG) BY MOUTH ONCE WEEKLY, TAKE EARLY IN THE DAY, Disp: 52 capsule, Rfl: 3 .  dexamethasone (DECADRON) 4 MG tablet, Take 5 tablets weekly, Disp: 90 tablet, Rfl:  3 .  fluticasone furoate-vilanterol (BREO ELLIPTA) 100-25 MCG/INH AEPB, Inhale one puff once daily to prevent cough or wheeze, Disp: 60 each, Rfl: 5 .  insulin lispro (HUMALOG) 100 UNIT/ML injection, Inject into the skin 3 (three) times daily before meals. 3 to 4 units TID prn SS, Disp: , Rfl:  .  Lacosamide (VIMPAT) 150 MG TABS, TAKE 1 TABLET BY MOUTH TWICE DAILY TAKE AN ADDITIONAL 1 TABLET AFTER DIALYSIS SESSION THREE TIMES A WEEK., Disp: 225 tablet, Rfl: 3 .  lamoTRIgine (LAMICTAL) 150 MG tablet, Take 1 tablet (150 mg total) by mouth 2 (two) times daily., Disp: 180 tablet, Rfl: 3 .  metoprolol tartrate (LOPRESSOR) 25 MG tablet, Take 0.5 tablets (12.5 mg total) by mouth 2 (two) times daily., Disp: 60 tablet, Rfl: 0 .  midodrine (PROAMATINE) 10 MG tablet, Take 10 mg by mouth 2 (two) times  daily with a meal. , Disp: , Rfl:  .  multivitamin (RENA-VIT) TABS tablet, Take 1 tablet by mouth at bedtime., Disp: 30 tablet, Rfl: 1 .  pantoprazole (PROTONIX) 40 MG tablet, TAKE 1 TABLET BY MOUTH ONCE DAILY, Disp: 90 tablet, Rfl: 0 .  prochlorperazine (COMPAZINE) 10 MG tablet, TAKE 1 TABLET (10 MG TOTAL) BY MOUTH EVERY 6 (SIX) HOURS AS NEEDED FOR NAUSEA OR VOMITING., Disp: 30 tablet, Rfl: 0   Allergies  Allergen Reactions  . Codeine Rash and Other (See Comments)    Unknown reaction (patient says it was more serious than just a rash, but he can't remember what happened) Unknown reaction (patient says it was more serious than just a rash, but he can't remember what happened)     Review of Systems  Constitutional: Negative.   HENT: Negative.   Eyes: Negative.  Negative for blurred vision.  Respiratory: Negative.  Negative for shortness of breath.   Cardiovascular: Negative.  Negative for chest pain and palpitations.  Gastrointestinal: Negative.   Endocrine: Negative.   Genitourinary: Negative.   Musculoskeletal: Negative.   Skin: Negative.   Allergic/Immunologic: Negative.   Neurological: Negative.   Hematological: Negative.   Psychiatric/Behavioral: Negative.      Today's Vitals   03/17/19 1138  BP: 132/70  Pulse: 65  Temp: 98.5 F (36.9 C)  TempSrc: Oral  Weight: 203 lb 6.4 oz (92.3 kg)  Height: 5' 5"  (1.651 m)  PainSc: 0-No pain   Body mass index is 33.85 kg/m.   Objective:  Physical Exam Vitals signs and nursing note reviewed.  Constitutional:      Appearance: Normal appearance. He is obese.  HENT:     Head: Normocephalic and atraumatic.     Right Ear: Ear canal and external ear normal. There is impacted cerumen.     Left Ear: Ear canal and external ear normal. There is impacted cerumen.     Ears:     Comments: Cerumen bilaterally is hard,impacted. TM not visualized.     Nose: Nose normal.     Mouth/Throat:     Mouth: Mucous membranes are moist.      Pharynx: Oropharynx is clear.  Eyes:     Extraocular Movements: Extraocular movements intact.     Conjunctiva/sclera: Conjunctivae normal.     Pupils: Pupils are equal, round, and reactive to light.  Neck:     Musculoskeletal: Normal range of motion and neck supple.  Cardiovascular:     Rate and Rhythm: Normal rate and regular rhythm.     Pulses: Normal pulses.     Heart sounds: Normal heart sounds.  Pulmonary:  Effort: Pulmonary effort is normal.     Breath sounds: Normal breath sounds.  Chest:     Breasts:        Right: Normal. No swelling, bleeding, inverted nipple, mass or nipple discharge.        Left: Normal. No swelling, bleeding, inverted nipple, mass or nipple discharge.  Abdominal:     General: Abdomen is flat. Bowel sounds are normal.     Palpations: Abdomen is soft.  Genitourinary:    Prostate: Normal.     Rectum: Normal. Guaiac result negative.  Musculoskeletal: Normal range of motion.  Skin:    General: Skin is warm.  Neurological:     General: No focal deficit present.     Mental Status: He is alert.  Psychiatric:        Mood and Affect: Mood normal.        Behavior: Behavior normal.         Assessment And Plan:     1. Routine general medical examination at health care facility  A full exam was performed. DRE deferred today. PATIENT HAS BEEN ADVISED TO GET 30-45 MINUTES REGULAR EXERCISE NO LESS THAN FOUR TO FIVE DAYS PER WEEK - BOTH WEIGHTBEARING EXERCISES AND AEROBIC ARE RECOMMENDED.  HE IS ADVISED TO FOLLOW A HEALTHY DIET WITH AT LEAST SIX FRUITS/VEGGIES PER DAY, DECREASE INTAKE OF RED MEAT, AND TO INCREASE FISH INTAKE TO TWO DAYS PER WEEK.  MEATS/FISH SHOULD NOT BE FRIED, BAKED OR BROILED IS PREFERABLE.  I SUGGEST WEARING SPF 50 SUNSCREEN ON EXPOSED PARTS AND ESPECIALLY WHEN IN THE DIRECT SUNLIGHT FOR AN EXTENDED PERIOD OF TIME.  PLEASE AVOID FAST FOOD RESTAURANTS AND INCREASE YOUR WATER INTAKE.  - Lipid panel - Hemoglobin A1c  2. Benign hypertensive  kidney disease with chronic kidney disease stage V or end stage renal disease ([place])  Fair control. He will continue with current meds.  He is encouraged to avoid adding salt to his foods. EKG performed, no acute changes noted. He will rto in six months for re-evaluation.   - EKG 12-Lead  3. ESRD (end stage renal disease) on dialysis ([facility])  Chronic, yet stable.   4. Bilateral impacted cerumen  AFTER OBTAINING VERBAL CONSENT, BOTH EARS WERE FLUSHED BY IRRIGATION. HE TOLERATED PROCEDURE WELL WITHOUT ANY COMPLICATIONS. NO TM ABNORMALITIES WERE NOTED.   5. Class 1 obesity due to excess calories with serious comorbidity and body mass index (BMI) of 33.0 to 33.9 in adult  Importance of achieving optimal weight to decrease risk of cardiovascular disease and cancers was discussed with the patient in full detail. He is encouraged to start slowly - start with 10 minutes twice daily at least three to four days per week and to gradually build to 30 minutes five days weekly. He was given tips to incorporate more activity into his daily routine - take stairs when possible, park farther away from grocery stores, etc.    6. Diabetes mellitus with end-stage renal disease ([place])  Diabetic foot exam was performed. I DISCUSSED WITH THE PATIENT AT LENGTH REGARDING THE GOALS OF GLYCEMIC CONTROL AND POSSIBLE LONG-TERM COMPLICATIONS.  I  ALSO STRESSED THE IMPORTANCE OF COMPLIANCE WITH HOME GLUCOSE MONITORING, DIETARY RESTRICTIONS INCLUDING AVOIDANCE OF SUGARY DRINKS/PROCESSED FOODS,  ALONG WITH REGULAR EXERCISE.  I  ALSO STRESSED THE IMPORTANCE OF ANNUAL EYE EXAMS, SELF FOOT CARE AND COMPLIANCE WITH OFFICE VISITS.     [name], MD    THE PATIENT IS ENCOURAGED TO PRACTICE SOCIAL DISTANCING DUE TO THE  COVID-19 PANDEMIC.

## 2019-03-28 DIAGNOSIS — N2581 Secondary hyperparathyroidism of renal origin: Secondary | ICD-10-CM | POA: Diagnosis not present

## 2019-03-28 DIAGNOSIS — N186 End stage renal disease: Secondary | ICD-10-CM | POA: Diagnosis not present

## 2019-03-28 DIAGNOSIS — D631 Anemia in chronic kidney disease: Secondary | ICD-10-CM | POA: Diagnosis not present

## 2019-03-28 DIAGNOSIS — D509 Iron deficiency anemia, unspecified: Secondary | ICD-10-CM | POA: Diagnosis not present

## 2019-03-28 DIAGNOSIS — E1129 Type 2 diabetes mellitus with other diabetic kidney complication: Secondary | ICD-10-CM | POA: Diagnosis not present

## 2019-03-30 DIAGNOSIS — N2581 Secondary hyperparathyroidism of renal origin: Secondary | ICD-10-CM | POA: Diagnosis not present

## 2019-03-30 DIAGNOSIS — D509 Iron deficiency anemia, unspecified: Secondary | ICD-10-CM | POA: Diagnosis not present

## 2019-03-30 DIAGNOSIS — D631 Anemia in chronic kidney disease: Secondary | ICD-10-CM | POA: Diagnosis not present

## 2019-03-30 DIAGNOSIS — N186 End stage renal disease: Secondary | ICD-10-CM | POA: Diagnosis not present

## 2019-03-30 DIAGNOSIS — E1129 Type 2 diabetes mellitus with other diabetic kidney complication: Secondary | ICD-10-CM | POA: Diagnosis not present

## 2019-03-31 DIAGNOSIS — Z5181 Encounter for therapeutic drug level monitoring: Secondary | ICD-10-CM | POA: Diagnosis not present

## 2019-03-31 DIAGNOSIS — Z1159 Encounter for screening for other viral diseases: Secondary | ICD-10-CM | POA: Diagnosis not present

## 2019-03-31 DIAGNOSIS — I272 Pulmonary hypertension, unspecified: Secondary | ICD-10-CM | POA: Diagnosis not present

## 2019-03-31 DIAGNOSIS — I071 Rheumatic tricuspid insufficiency: Secondary | ICD-10-CM | POA: Diagnosis not present

## 2019-03-31 DIAGNOSIS — Z113 Encounter for screening for infections with a predominantly sexual mode of transmission: Secondary | ICD-10-CM | POA: Diagnosis not present

## 2019-03-31 DIAGNOSIS — Z79899 Other long term (current) drug therapy: Secondary | ICD-10-CM | POA: Diagnosis not present

## 2019-03-31 DIAGNOSIS — R9431 Abnormal electrocardiogram [ECG] [EKG]: Secondary | ICD-10-CM | POA: Diagnosis not present

## 2019-03-31 DIAGNOSIS — C9000 Multiple myeloma not having achieved remission: Secondary | ICD-10-CM | POA: Diagnosis not present

## 2019-03-31 DIAGNOSIS — Z01818 Encounter for other preprocedural examination: Secondary | ICD-10-CM | POA: Diagnosis not present

## 2019-03-31 DIAGNOSIS — Z114 Encounter for screening for human immunodeficiency virus [HIV]: Secondary | ICD-10-CM | POA: Diagnosis not present

## 2019-03-31 MED FILL — CALCIUM ACETATE (PHOS BINDE: 667 | 30 days supply | Qty: 390 | Fill #2

## 2019-03-31 MED FILL — BREO ELLIPTA 200-25 MCG INH: 200-25 | 30 days supply | Qty: 60 | Fill #2

## 2019-04-01 DIAGNOSIS — N2581 Secondary hyperparathyroidism of renal origin: Secondary | ICD-10-CM | POA: Diagnosis not present

## 2019-04-01 DIAGNOSIS — E1129 Type 2 diabetes mellitus with other diabetic kidney complication: Secondary | ICD-10-CM | POA: Diagnosis not present

## 2019-04-01 DIAGNOSIS — D509 Iron deficiency anemia, unspecified: Secondary | ICD-10-CM | POA: Diagnosis not present

## 2019-04-01 DIAGNOSIS — D631 Anemia in chronic kidney disease: Secondary | ICD-10-CM | POA: Diagnosis not present

## 2019-04-01 DIAGNOSIS — N186 End stage renal disease: Secondary | ICD-10-CM | POA: Diagnosis not present

## 2019-04-01 MED FILL — CYCLOPHOSPHAMIDE 50 MG CAPS: 50 | 28 days supply | Qty: 52 | Fill #3

## 2019-04-04 ENCOUNTER — Other Ambulatory Visit: Payer: Self-pay | Admitting: Internal Medicine

## 2019-04-04 DIAGNOSIS — E1129 Type 2 diabetes mellitus with other diabetic kidney complication: Secondary | ICD-10-CM | POA: Diagnosis not present

## 2019-04-04 DIAGNOSIS — N186 End stage renal disease: Secondary | ICD-10-CM | POA: Diagnosis not present

## 2019-04-04 DIAGNOSIS — D509 Iron deficiency anemia, unspecified: Secondary | ICD-10-CM | POA: Diagnosis not present

## 2019-04-04 DIAGNOSIS — D631 Anemia in chronic kidney disease: Secondary | ICD-10-CM | POA: Diagnosis not present

## 2019-04-04 DIAGNOSIS — N2581 Secondary hyperparathyroidism of renal origin: Secondary | ICD-10-CM | POA: Diagnosis not present

## 2019-04-04 MED FILL — VIMPAT 150 MG TABLET: 150 | 90 days supply | Qty: 225 | Fill #1

## 2019-04-05 ENCOUNTER — Inpatient Hospital Stay (HOSPITAL_BASED_OUTPATIENT_CLINIC_OR_DEPARTMENT_OTHER): Payer: Medicare Other | Admitting: Oncology

## 2019-04-05 ENCOUNTER — Telehealth: Payer: Self-pay

## 2019-04-05 ENCOUNTER — Inpatient Hospital Stay: Payer: Medicare Other

## 2019-04-05 ENCOUNTER — Telehealth: Payer: Self-pay | Admitting: Oncology

## 2019-04-05 ENCOUNTER — Other Ambulatory Visit: Payer: Self-pay

## 2019-04-05 VITALS — BP 143/67 | HR 80 | Temp 97.8°F | Resp 18 | Wt 204.2 lb

## 2019-04-05 DIAGNOSIS — D630 Anemia in neoplastic disease: Secondary | ICD-10-CM

## 2019-04-05 DIAGNOSIS — Z79899 Other long term (current) drug therapy: Secondary | ICD-10-CM

## 2019-04-05 DIAGNOSIS — E119 Type 2 diabetes mellitus without complications: Secondary | ICD-10-CM

## 2019-04-05 DIAGNOSIS — D631 Anemia in chronic kidney disease: Secondary | ICD-10-CM

## 2019-04-05 DIAGNOSIS — C9000 Multiple myeloma not having achieved remission: Secondary | ICD-10-CM | POA: Diagnosis not present

## 2019-04-05 DIAGNOSIS — N189 Chronic kidney disease, unspecified: Secondary | ICD-10-CM

## 2019-04-05 DIAGNOSIS — Z992 Dependence on renal dialysis: Secondary | ICD-10-CM | POA: Diagnosis not present

## 2019-04-05 DIAGNOSIS — Z5112 Encounter for antineoplastic immunotherapy: Secondary | ICD-10-CM | POA: Diagnosis not present

## 2019-04-05 LAB — CBC WITH DIFFERENTIAL (CANCER CENTER ONLY)
Abs Immature Granulocytes: 0.02 10*3/uL (ref 0.00–0.07)
Basophils Absolute: 0.1 10*3/uL (ref 0.0–0.1)
Basophils Relative: 2 %
Eosinophils Absolute: 0.2 10*3/uL (ref 0.0–0.5)
Eosinophils Relative: 4 %
HCT: 27.2 % — ABNORMAL LOW (ref 39.0–52.0)
Hemoglobin: 10.1 g/dL — ABNORMAL LOW (ref 13.0–17.0)
Immature Granulocytes: 0 %
Lymphocytes Relative: 0 %
Lymphs Abs: 0 10*3/uL — ABNORMAL LOW (ref 0.7–4.0)
MCH: 32.5 pg (ref 26.0–34.0)
MCHC: 37.1 g/dL — ABNORMAL HIGH (ref 30.0–36.0)
MCV: 87.5 fL (ref 80.0–100.0)
Monocytes Absolute: 0.5 10*3/uL (ref 0.1–1.0)
Monocytes Relative: 9 %
Neutro Abs: 4.8 10*3/uL (ref 1.7–7.7)
Neutrophils Relative %: 85 %
Platelet Count: 206 10*3/uL (ref 150–400)
RBC: 3.11 MIL/uL — ABNORMAL LOW (ref 4.22–5.81)
RDW: 17.6 % — ABNORMAL HIGH (ref 11.5–15.5)
WBC Count: 5.7 10*3/uL (ref 4.0–10.5)
nRBC: 9.9 % — ABNORMAL HIGH (ref 0.0–0.2)

## 2019-04-05 LAB — CMP (CANCER CENTER ONLY)
ALT: 7 U/L (ref 0–44)
AST: 15 U/L (ref 15–41)
Albumin: 3.7 g/dL (ref 3.5–5.0)
Alkaline Phosphatase: 76 U/L (ref 38–126)
Anion gap: 12 (ref 5–15)
BUN: 28 mg/dL — ABNORMAL HIGH (ref 6–20)
CO2: 30 mmol/L (ref 22–32)
Calcium: 8.7 mg/dL — ABNORMAL LOW (ref 8.9–10.3)
Chloride: 96 mmol/L — ABNORMAL LOW (ref 98–111)
Creatinine: 7.19 mg/dL (ref 0.61–1.24)
GFR, Est AFR Am: 9 mL/min — ABNORMAL LOW (ref >60)
GFR, Estimated: 8 mL/min — ABNORMAL LOW (ref >60)
Glucose, Bld: 163 mg/dL — ABNORMAL HIGH (ref 70–99)
Potassium: 4 mmol/L (ref 3.5–5.1)
Sodium: 138 mmol/L (ref 135–145)
Total Bilirubin: 0.8 mg/dL (ref 0.3–1.2)
Total Protein: 6.9 g/dL (ref 6.5–8.1)

## 2019-04-05 MED ORDER — PROCHLORPERAZINE MALEATE 10 MG PO TABS: 10.0000 mg | ORAL_TABLET | Freq: Once | ORAL | Status: DC

## 2019-04-05 MED ORDER — BORTEZOMIB CHEMO SQ INJECTION 3.5 MG (2.5MG/ML)
1.5000 mg/m2 | Freq: Once | INTRAMUSCULAR | Status: AC
  Administered 2019-04-05: 3.25 mg via SUBCUTANEOUS
  Filled 2019-04-05: qty 1.3

## 2019-04-05 NOTE — Patient Instructions (Signed)
[facility] Discharge Instructions for Patients Receiving Chemotherapy  Today you received the following chemotherapy agent: Bortezomib (Velcade)  To help prevent nausea and vomiting after your treatment, we encourage you to take your nausea medication as directed.   If you develop nausea and vomiting that is not controlled by your nausea medication, call the clinic.   BELOW ARE SYMPTOMS THAT SHOULD BE REPORTED IMMEDIATELY:  *FEVER GREATER THAN 100.5 F  *CHILLS WITH OR WITHOUT FEVER  NAUSEA AND VOMITING THAT IS NOT CONTROLLED WITH YOUR NAUSEA MEDICATION  *UNUSUAL SHORTNESS OF BREATH  *UNUSUAL BRUISING OR BLEEDING  TENDERNESS IN MOUTH AND THROAT WITH OR WITHOUT PRESENCE OF ULCERS  *URINARY PROBLEMS  *BOWEL PROBLEMS  UNUSUAL RASH Items with * indicate a potential emergency and should be followed up as soon as possible.  Feel free to call the clinic should you have any questions or concerns. The clinic phone number is [phone].  Please show the [place] at check-in to the Emergency Department and triage nurse.

## 2019-04-05 NOTE — Progress Notes (Signed)
Hematology and Oncology Follow Up Visit  [place] [id number] 1965/12/27 53 y.o. 04/05/2019 10:47 AM    Principle Diagnosis: 53 year old man with  1.  Multiple myeloma diagnosed in September 2019.  He was found to have IgG kappa with 20% plasma involvement evolving from MGUS.   2.  Chronic renal failure currently hemodialysis dependent.  Current therapy: Velcade dexamethasone and cyclophosphamide weekly started on 05/25/2018.  Velcade is given at 1.5 mg/m weekly, dexamethasone is 20 mg weekly and and Cytoxan is given at 650 mg total dose.    Interim History:  Mr. Kagawa returns today for a repeat evaluation.  Since the last visit, he continues to tolerate chemotherapy without any major complaints.  He denies any nausea, vomiting or recent hospitalizations.  He denies any peripheral neuropathy or weakness.  He is currently under evaluation for high-dose chemotherapy and stem cell transplant at [facility].   Patient denied headaches, blurry vision, syncope or seizures.  Denies any fevers, chills or sweats.  Denied chest pain, palpitation, orthopnea or leg edema.  Denied cough, wheezing or hemoptysis.  Denied nausea, vomiting or abdominal pain.  Denies any constipation or diarrhea.  Denies any frequency urgency or hesitancy.  Denies any arthralgias or myalgias.  Denies any skin rashes or lesions.  Denies any bleeding or clotting tendency.  Denies any easy bruising.  Denies any hair or nail changes.  Denies any anxiety or depression.  Remaining review of system is negative.           Medications: I have reviewed the patient's current medications.  Current Outpatient Medications:  .  acyclovir (ZOVIRAX) 400 MG tablet, TAKE 1 TABLET (400 MG TOTAL) BY MOUTH DAILY., Disp: 90 tablet, Rfl: 0 .  albuterol (PROAIR HFA) 108 (90 Base) MCG/ACT inhaler, Inhale two puffs every 4-6 hours if needed for cough or wheeze, Disp: 1 Inhaler, Rfl: 1 .  aspirin EC 81 MG tablet, Take 1 tablet (81 mg total) by  mouth daily., Disp: 100 tablet, Rfl: 0 .  bortezomib SQ (VELCADE) 3.5 MG SOLR, Inject 1.3 mg/m2 into the skin once a week., Disp: , Rfl:  .  BREO ELLIPTA 200-25 MCG/INH AEPB, INHALE 1 PUFF BY MOUTH AT THE SAME TIME EVERY DAY, Disp: 60 each, Rfl: 2 .  calcium acetate (PHOSLO) 667 MG capsule, Take 2,001 mg by mouth 3 (three) times daily with meals. , Disp: , Rfl:  .  Cetirizine HCl 10 MG CAPS, Take 1 capsule by mouth daily., Disp: , Rfl:  .  cinacalcet (SENSIPAR) 60 MG tablet, Take 60 mg by mouth every other day. , Disp: , Rfl:  .  clonazePAM (KLONOPIN) 0.5 MG tablet, TAKE 1 TABLET BY MOUTH PRIOR TO EACH DIALYSIS SESSION, Disp: 45 tablet, Rfl: 3 .  cyclophosphamide (CYTOXAN) 50 MG capsule, TAKE 13 CAPSULES (650MG) BY MOUTH ONCE WEEKLY, TAKE EARLY IN THE DAY, Disp: 52 capsule, Rfl: 3 .  dexamethasone (DECADRON) 4 MG tablet, Take 5 tablets weekly, Disp: 90 tablet, Rfl: 3 .  fluticasone furoate-vilanterol (BREO ELLIPTA) 100-25 MCG/INH AEPB, Inhale one puff once daily to prevent cough or wheeze, Disp: 60 each, Rfl: 5 .  insulin lispro (HUMALOG) 100 UNIT/ML injection, Inject into the skin 3 (three) times daily before meals. 3 to 4 units TID prn SS, Disp: , Rfl:  .  Lacosamide (VIMPAT) 150 MG TABS, TAKE 1 TABLET BY MOUTH TWICE DAILY TAKE AN ADDITIONAL 1 TABLET AFTER DIALYSIS SESSION THREE TIMES A WEEK., Disp: 225 tablet, Rfl: 3 .  lamoTRIgine (LAMICTAL) 150  MG tablet, Take 1 tablet (150 mg total) by mouth 2 (two) times daily., Disp: 180 tablet, Rfl: 3 .  metoprolol tartrate (LOPRESSOR) 25 MG tablet, Take 0.5 tablets (12.5 mg total) by mouth 2 (two) times daily., Disp: 60 tablet, Rfl: 0 .  midodrine (PROAMATINE) 10 MG tablet, Take 10 mg by mouth 2 (two) times daily with a meal. , Disp: , Rfl:  .  multivitamin (RENA-VIT) TABS tablet, Take 1 tablet by mouth at bedtime., Disp: 30 tablet, Rfl: 1 .  pantoprazole (PROTONIX) 40 MG tablet, TAKE 1 TABLET BY MOUTH ONCE DAILY, Disp: 90 tablet, Rfl: 1 .   prochlorperazine (COMPAZINE) 10 MG tablet, TAKE 1 TABLET (10 MG TOTAL) BY MOUTH EVERY 6 (SIX) HOURS AS NEEDED FOR NAUSEA OR VOMITING., Disp: 30 tablet, Rfl: 0  Allergies:  Allergies  Allergen Reactions  . Codeine Rash and Other (See Comments)    Unknown reaction (patient says it was more serious than just a rash, but he can't remember what happened) Unknown reaction (patient says it was more serious than just a rash, but he can't remember what happened)    Past Medical History, Surgical history, Social history, and Family History were reviewed and updated.  Physical Exam:  Blood pressure (!) 143/67, pulse 80, temperature 97.8 F (36.6 C), temperature source Temporal, resp. rate 18, weight 204 lb 3.2 oz (92.6 kg), SpO2 97 %.     ECOG: 1    General appearance: Comfortable appearing without any discomfort Head: Normocephalic without any trauma Oropharynx: Mucous membranes are moist and pink without any thrush or ulcers. Eyes: Pupils are equal and round reactive to light. Lymph nodes: No cervical, supraclavicular, inguinal or axillary lymphadenopathy.   Heart:regular rate and rhythm.  S1 and S2 without leg edema. Lung: Clear without any rhonchi or wheezes.  No dullness to percussion. Abdomin: Soft, nontender, nondistended with good bowel sounds.  No hepatosplenomegaly. Musculoskeletal: No joint deformity or effusion.  Full range of motion noted. Neurological: No deficits noted on motor, sensory and deep tendon reflex exam. Skin: No petechial rash or dryness.  Appeared moist.             Lab Results: Lab Results  Component Value Date   WBC 5.5 03/22/2019   HGB 10.3 (L) 03/22/2019   HCT 28.2 (L) 03/22/2019   MCV 88.1 03/22/2019   PLT 168 03/22/2019     Chemistry      Component Value Date/Time   NA 140 03/22/2019 0836   NA 138 07/03/2017 0813   K 3.9 03/22/2019 0836   K 4.2 07/03/2017 0813   CL 98 03/22/2019 0836   CO2 30 03/22/2019 0836   CO2 27 07/03/2017  0813   BUN 22 (H) 03/22/2019 0836   BUN 42 (A) 02/16/2019   BUN 46.4 (H) 07/03/2017 0813   CREATININE 6.35 (HH) 03/22/2019 0836   CREATININE 10.9 (HH) 07/03/2017 0813      Component Value Date/Time   CALCIUM 9.0 03/22/2019 0836   CALCIUM 9.3 07/03/2017 0813   ALKPHOS 73 03/22/2019 0836   ALKPHOS 77 07/03/2017 0813   AST 16 03/22/2019 0836   AST 16 07/03/2017 0813   ALT 7 03/22/2019 0836   ALT 14 07/03/2017 0813   BILITOT 0.9 03/22/2019 0836   BILITOT 0.93 07/03/2017 0813        Results for SPENCE, SOBERANO (MRN [id number]) as of 04/05/2019 10:58  Ref. Range 03/22/2019 08:36  M Protein SerPl Elph-Mcnc Latest Ref Range: Not Observed g/dL 0.2 (H)  Impression and Plan:  53 year old man with:  1.  Multiple myeloma diagnosed in 2019.  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   He is currently receiving systemic therapy outlined above and has tolerated it very well.  Risks and benefits of continuing this approach was discussed today.  Complications include nausea, vomiting, myelosuppression were reiterated.  We will continue with weekly treatment till he has a set date for stem cell collection and chemotherapy will be withheld 2 weeks prior.     2. Anemia: His hemoglobin remained stable without any need for transfusion.  This is related to plasma cell disorder.  3. Renal failure: He is on dialysis without any recent complications.  4.  Zoster prophylaxis: He is on acyclovir without any recent reactivation.  5.  Diabetes: Blood sugar remained under control despite the dexamethasone.  6.  Nausea prophylaxis: No issues reported with nausea or vomiting at this time.  7.  Goals of care: His disease may be incurable although aggressive measures are warranted given his excellent performance status.   8. Follow-up: We will continue with weekly chemotherapy treatment MD follow-up in 4 weeks.  25  minutes was spent with the patient face-to-face today.  More than 50% of  time was dedicated to reviewing his disease status, treatment options answering questions regarding future plan of care.     Zola Button, MD 7/28/202010:47 AM

## 2019-04-05 NOTE — Telephone Encounter (Signed)
Scheduled per los. Mailed printout  °

## 2019-04-05 NOTE — Telephone Encounter (Signed)
Dr. Alen Blew made aware of creatinine 7.19. No new orders received.

## 2019-04-06 DIAGNOSIS — N2581 Secondary hyperparathyroidism of renal origin: Secondary | ICD-10-CM | POA: Diagnosis not present

## 2019-04-06 DIAGNOSIS — D631 Anemia in chronic kidney disease: Secondary | ICD-10-CM | POA: Diagnosis not present

## 2019-04-06 DIAGNOSIS — E1129 Type 2 diabetes mellitus with other diabetic kidney complication: Secondary | ICD-10-CM | POA: Diagnosis not present

## 2019-04-06 DIAGNOSIS — N186 End stage renal disease: Secondary | ICD-10-CM | POA: Diagnosis not present

## 2019-04-06 DIAGNOSIS — D509 Iron deficiency anemia, unspecified: Secondary | ICD-10-CM | POA: Diagnosis not present

## 2019-04-07 DIAGNOSIS — J849 Interstitial pulmonary disease, unspecified: Secondary | ICD-10-CM | POA: Diagnosis not present

## 2019-04-07 DIAGNOSIS — R942 Abnormal results of pulmonary function studies: Secondary | ICD-10-CM | POA: Diagnosis not present

## 2019-04-07 DIAGNOSIS — Z8579 Personal history of other malignant neoplasms of lymphoid, hematopoietic and related tissues: Secondary | ICD-10-CM | POA: Diagnosis not present

## 2019-04-07 DIAGNOSIS — J449 Chronic obstructive pulmonary disease, unspecified: Secondary | ICD-10-CM | POA: Diagnosis not present

## 2019-04-07 DIAGNOSIS — J9811 Atelectasis: Secondary | ICD-10-CM | POA: Diagnosis not present

## 2019-04-07 DIAGNOSIS — Z8709 Personal history of other diseases of the respiratory system: Secondary | ICD-10-CM | POA: Insufficient documentation

## 2019-04-07 DIAGNOSIS — N186 End stage renal disease: Secondary | ICD-10-CM | POA: Diagnosis not present

## 2019-04-08 DIAGNOSIS — E1129 Type 2 diabetes mellitus with other diabetic kidney complication: Secondary | ICD-10-CM | POA: Diagnosis not present

## 2019-04-08 DIAGNOSIS — N2581 Secondary hyperparathyroidism of renal origin: Secondary | ICD-10-CM | POA: Diagnosis not present

## 2019-04-08 DIAGNOSIS — D509 Iron deficiency anemia, unspecified: Secondary | ICD-10-CM | POA: Diagnosis not present

## 2019-04-08 DIAGNOSIS — N186 End stage renal disease: Secondary | ICD-10-CM | POA: Diagnosis not present

## 2019-04-08 DIAGNOSIS — D631 Anemia in chronic kidney disease: Secondary | ICD-10-CM | POA: Diagnosis not present

## 2019-04-09 DIAGNOSIS — E1122 Type 2 diabetes mellitus with diabetic chronic kidney disease: Secondary | ICD-10-CM | POA: Diagnosis not present

## 2019-04-09 DIAGNOSIS — N186 End stage renal disease: Secondary | ICD-10-CM | POA: Diagnosis not present

## 2019-04-09 DIAGNOSIS — Z992 Dependence on renal dialysis: Secondary | ICD-10-CM | POA: Diagnosis not present

## 2019-04-11 DIAGNOSIS — N186 End stage renal disease: Secondary | ICD-10-CM | POA: Diagnosis not present

## 2019-04-11 DIAGNOSIS — N2581 Secondary hyperparathyroidism of renal origin: Secondary | ICD-10-CM | POA: Diagnosis not present

## 2019-04-11 DIAGNOSIS — E1065 Type 1 diabetes mellitus with hyperglycemia: Secondary | ICD-10-CM | POA: Diagnosis not present

## 2019-04-11 DIAGNOSIS — E1129 Type 2 diabetes mellitus with other diabetic kidney complication: Secondary | ICD-10-CM | POA: Diagnosis not present

## 2019-04-11 DIAGNOSIS — Z992 Dependence on renal dialysis: Secondary | ICD-10-CM | POA: Diagnosis not present

## 2019-04-11 DIAGNOSIS — D509 Iron deficiency anemia, unspecified: Secondary | ICD-10-CM | POA: Diagnosis not present

## 2019-04-11 DIAGNOSIS — D631 Anemia in chronic kidney disease: Secondary | ICD-10-CM | POA: Diagnosis not present

## 2019-04-12 ENCOUNTER — Inpatient Hospital Stay: Payer: Medicare Other

## 2019-04-12 ENCOUNTER — Other Ambulatory Visit: Payer: Self-pay

## 2019-04-12 ENCOUNTER — Inpatient Hospital Stay: Payer: Medicare Other | Attending: Oncology

## 2019-04-12 ENCOUNTER — Telehealth: Payer: Self-pay | Admitting: *Deleted

## 2019-04-12 VITALS — BP 136/70 | HR 68 | Temp 97.8°F | Resp 18 | Wt 204.5 lb

## 2019-04-12 DIAGNOSIS — C9000 Multiple myeloma not having achieved remission: Secondary | ICD-10-CM | POA: Diagnosis not present

## 2019-04-12 DIAGNOSIS — Z5112 Encounter for antineoplastic immunotherapy: Secondary | ICD-10-CM | POA: Diagnosis not present

## 2019-04-12 LAB — CBC WITH DIFFERENTIAL (CANCER CENTER ONLY)
Abs Immature Granulocytes: 0.02 10*3/uL (ref 0.00–0.07)
Basophils Absolute: 0.1 10*3/uL (ref 0.0–0.1)
Basophils Relative: 3 %
Eosinophils Absolute: 0.8 10*3/uL — ABNORMAL HIGH (ref 0.0–0.5)
Eosinophils Relative: 17 %
HCT: 28.8 % — ABNORMAL LOW (ref 39.0–52.0)
Hemoglobin: 10.8 g/dL — ABNORMAL LOW (ref 13.0–17.0)
Immature Granulocytes: 0 %
Lymphocytes Relative: 0 %
Lymphs Abs: 0 10*3/uL — ABNORMAL LOW (ref 0.7–4.0)
MCH: 33.8 pg (ref 26.0–34.0)
MCHC: 37.5 g/dL — ABNORMAL HIGH (ref 30.0–36.0)
MCV: 90 fL (ref 80.0–100.0)
Monocytes Absolute: 1.1 10*3/uL — ABNORMAL HIGH (ref 0.1–1.0)
Monocytes Relative: 22 %
Neutro Abs: 2.8 10*3/uL (ref 1.7–7.7)
Neutrophils Relative %: 58 %
Platelet Count: 134 10*3/uL — ABNORMAL LOW (ref 150–400)
RBC: 3.2 MIL/uL — ABNORMAL LOW (ref 4.22–5.81)
RDW: 17.2 % — ABNORMAL HIGH (ref 11.5–15.5)
WBC Count: 4.9 10*3/uL (ref 4.0–10.5)
nRBC: 14.1 % — ABNORMAL HIGH (ref 0.0–0.2)

## 2019-04-12 LAB — CMP (CANCER CENTER ONLY)
ALT: 7 U/L (ref 0–44)
AST: 14 U/L — ABNORMAL LOW (ref 15–41)
Albumin: 3.5 g/dL (ref 3.5–5.0)
Alkaline Phosphatase: 71 U/L (ref 38–126)
Anion gap: 12 (ref 5–15)
BUN: 23 mg/dL — ABNORMAL HIGH (ref 6–20)
CO2: 31 mmol/L (ref 22–32)
Calcium: 8.5 mg/dL — ABNORMAL LOW (ref 8.9–10.3)
Chloride: 97 mmol/L — ABNORMAL LOW (ref 98–111)
Creatinine: 6.97 mg/dL (ref 0.61–1.24)
GFR, Est AFR Am: 9 mL/min — ABNORMAL LOW (ref >60)
GFR, Estimated: 8 mL/min — ABNORMAL LOW (ref >60)
Glucose, Bld: 107 mg/dL — ABNORMAL HIGH (ref 70–99)
Potassium: 3.9 mmol/L (ref 3.5–5.1)
Sodium: 140 mmol/L (ref 135–145)
Total Bilirubin: 0.8 mg/dL (ref 0.3–1.2)
Total Protein: 6.5 g/dL (ref 6.5–8.1)

## 2019-04-12 MED ORDER — BORTEZOMIB CHEMO SQ INJECTION 3.5 MG (2.5MG/ML)
1.5000 mg/m2 | Freq: Once | INTRAMUSCULAR | Status: AC
  Administered 2019-04-12: 3.25 mg via SUBCUTANEOUS
  Filled 2019-04-12: qty 1.3

## 2019-04-12 MED ORDER — PROCHLORPERAZINE MALEATE 10 MG PO TABS: 10.0000 mg | ORAL_TABLET | Freq: Once | ORAL | Status: DC

## 2019-04-12 NOTE — Telephone Encounter (Signed)
Received call report from [place].   "Today's Creat = 6.97."   Collaborative nurse provided note with results.  Recurrent elevated creatinine results, [place] receives dialysis.   Next scheduled appt. infusion at 9:30 am.

## 2019-04-12 NOTE — Patient Instructions (Signed)
[facility] Discharge Instructions for Patients Receiving Chemotherapy  Today you received the following chemotherapy agents Bortezomib (VELCADE).  To help prevent nausea and vomiting after your treatment, we encourage you to take your nausea medication as prescribed.  If you develop nausea and vomiting that is not controlled by your nausea medication, call the clinic.   BELOW ARE SYMPTOMS THAT SHOULD BE REPORTED IMMEDIATELY:  *FEVER GREATER THAN 100.5 F  *CHILLS WITH OR WITHOUT FEVER  NAUSEA AND VOMITING THAT IS NOT CONTROLLED WITH YOUR NAUSEA MEDICATION  *UNUSUAL SHORTNESS OF BREATH  *UNUSUAL BRUISING OR BLEEDING  TENDERNESS IN MOUTH AND THROAT WITH OR WITHOUT PRESENCE OF ULCERS  *URINARY PROBLEMS  *BOWEL PROBLEMS  UNUSUAL RASH Items with * indicate a potential emergency and should be followed up as soon as possible.  Feel free to call the clinic should you have any questions or concerns. The clinic phone number is [phone].  Please show the [place] at check-in to the Emergency Department and triage nurse.  Coronavirus (COVID-19) Are you at risk?  Are you at risk for the Coronavirus (COVID-19)?  To be considered HIGH RISK for Coronavirus (COVID-19), you have to meet the following criteria:  . Traveled to [place], [place], [place], [place] or [place]; or in the [place] to [place], [place], [place], or [place]; and have fever, cough, and shortness of breath within the last 2 weeks of travel OR . Been in close contact with a person diagnosed with COVID-19 within the last 2 weeks and have fever, cough, and shortness of breath . IF YOU DO NOT MEET THESE CRITERIA, YOU ARE CONSIDERED LOW RISK FOR COVID-19.  What to do if you are HIGH RISK for COVID-19?  [name] If you are having a medical emergency, call 911. . Seek medical care right away. Before you go to a doctor's office, urgent care or emergency department, call ahead and tell them  about your recent travel, contact with someone diagnosed with COVID-19, and your symptoms. You should receive instructions from your physician's office regarding next steps of care.  . When you arrive at healthcare provider, tell the healthcare staff immediately you have returned from visiting [place], [place], [place], [place] or [place]; or traveled in the [place] to [place], [place], [place], or [place]; in the last two weeks or you have been in close contact with a person diagnosed with COVID-19 in the last 2 weeks.   . Tell the health care staff about your symptoms: fever, cough and shortness of breath. . After you have been seen by a medical provider, you will be either: o Tested for (COVID-19) and discharged home on quarantine except to seek medical care if symptoms worsen, and asked to  - Stay home and avoid contact with others until you get your results (4-5 days)  - Avoid travel on public transportation if possible (such as bus, train, or airplane) or o Sent to the Emergency Department by EMS for evaluation, COVID-19 testing, and possible admission depending on your condition and test results.  What to do if you are LOW RISK for COVID-19?  Reduce your risk of any infection by using the same precautions used for avoiding the common cold or flu:  [name] Wash your hands often with soap and warm water for at least 20 seconds.  If soap and water are not readily available, use an alcohol-based hand sanitizer with at least 60% alcohol.  . If coughing or sneezing,  cover your mouth and nose by coughing or sneezing into the elbow areas of your shirt or coat, into a tissue or into your sleeve (not your hands). . Avoid shaking hands with others and consider head nods or verbal greetings only. . Avoid touching your eyes, nose, or mouth with unwashed hands.  . Avoid close contact with people who are sick. . Avoid places or events with large numbers of people in one location, like concerts or  sporting events. . Carefully consider travel plans you have or are making. . If you are planning any travel outside or inside the [place], visit the CDC's Travelers' Health webpage for the latest health notices. . If you have some symptoms but not all symptoms, continue to monitor at home and seek medical attention if your symptoms worsen. . If you are having a medical emergency, call 911.   [place] / e-Visit: [email or web address]         [facility]: [facility]: [phone]                   [facility]: [phone]

## 2019-04-13 DIAGNOSIS — N186 End stage renal disease: Secondary | ICD-10-CM | POA: Diagnosis not present

## 2019-04-13 DIAGNOSIS — D631 Anemia in chronic kidney disease: Secondary | ICD-10-CM | POA: Diagnosis not present

## 2019-04-13 DIAGNOSIS — N2581 Secondary hyperparathyroidism of renal origin: Secondary | ICD-10-CM | POA: Diagnosis not present

## 2019-04-13 DIAGNOSIS — Z992 Dependence on renal dialysis: Secondary | ICD-10-CM | POA: Diagnosis not present

## 2019-04-13 DIAGNOSIS — D509 Iron deficiency anemia, unspecified: Secondary | ICD-10-CM | POA: Diagnosis not present

## 2019-04-13 DIAGNOSIS — E1129 Type 2 diabetes mellitus with other diabetic kidney complication: Secondary | ICD-10-CM | POA: Diagnosis not present

## 2019-04-14 DIAGNOSIS — C9000 Multiple myeloma not having achieved remission: Secondary | ICD-10-CM | POA: Diagnosis not present

## 2019-04-14 DIAGNOSIS — E109 Type 1 diabetes mellitus without complications: Secondary | ICD-10-CM | POA: Diagnosis not present

## 2019-04-14 DIAGNOSIS — I10 Essential (primary) hypertension: Secondary | ICD-10-CM | POA: Diagnosis not present

## 2019-04-14 DIAGNOSIS — N186 End stage renal disease: Secondary | ICD-10-CM | POA: Diagnosis not present

## 2019-04-15 DIAGNOSIS — N186 End stage renal disease: Secondary | ICD-10-CM | POA: Diagnosis not present

## 2019-04-15 DIAGNOSIS — N2581 Secondary hyperparathyroidism of renal origin: Secondary | ICD-10-CM | POA: Diagnosis not present

## 2019-04-15 DIAGNOSIS — D631 Anemia in chronic kidney disease: Secondary | ICD-10-CM | POA: Diagnosis not present

## 2019-04-15 DIAGNOSIS — D509 Iron deficiency anemia, unspecified: Secondary | ICD-10-CM | POA: Diagnosis not present

## 2019-04-15 DIAGNOSIS — E1129 Type 2 diabetes mellitus with other diabetic kidney complication: Secondary | ICD-10-CM | POA: Diagnosis not present

## 2019-04-15 DIAGNOSIS — Z992 Dependence on renal dialysis: Secondary | ICD-10-CM | POA: Diagnosis not present

## 2019-04-18 DIAGNOSIS — N2581 Secondary hyperparathyroidism of renal origin: Secondary | ICD-10-CM | POA: Diagnosis not present

## 2019-04-18 DIAGNOSIS — N186 End stage renal disease: Secondary | ICD-10-CM | POA: Diagnosis not present

## 2019-04-18 DIAGNOSIS — E1129 Type 2 diabetes mellitus with other diabetic kidney complication: Secondary | ICD-10-CM | POA: Diagnosis not present

## 2019-04-18 DIAGNOSIS — Z992 Dependence on renal dialysis: Secondary | ICD-10-CM | POA: Diagnosis not present

## 2019-04-18 DIAGNOSIS — D631 Anemia in chronic kidney disease: Secondary | ICD-10-CM | POA: Diagnosis not present

## 2019-04-18 DIAGNOSIS — D509 Iron deficiency anemia, unspecified: Secondary | ICD-10-CM | POA: Diagnosis not present

## 2019-04-19 ENCOUNTER — Inpatient Hospital Stay: Payer: Medicare Other

## 2019-04-19 ENCOUNTER — Other Ambulatory Visit: Payer: Self-pay

## 2019-04-19 VITALS — BP 143/87 | HR 68 | Temp 98.6°F | Resp 16

## 2019-04-19 DIAGNOSIS — C9000 Multiple myeloma not having achieved remission: Secondary | ICD-10-CM

## 2019-04-19 DIAGNOSIS — I272 Pulmonary hypertension, unspecified: Secondary | ICD-10-CM | POA: Diagnosis not present

## 2019-04-19 DIAGNOSIS — I10 Essential (primary) hypertension: Secondary | ICD-10-CM | POA: Diagnosis not present

## 2019-04-19 DIAGNOSIS — Z5112 Encounter for antineoplastic immunotherapy: Secondary | ICD-10-CM | POA: Diagnosis not present

## 2019-04-19 LAB — CMP (CANCER CENTER ONLY)
ALT: 10 U/L (ref 0–44)
AST: 16 U/L (ref 15–41)
Albumin: 3.9 g/dL (ref 3.5–5.0)
Alkaline Phosphatase: 70 U/L (ref 38–126)
Anion gap: 13 (ref 5–15)
BUN: 29 mg/dL — ABNORMAL HIGH (ref 6–20)
CO2: 30 mmol/L (ref 22–32)
Calcium: 8.3 mg/dL — ABNORMAL LOW (ref 8.9–10.3)
Chloride: 94 mmol/L — ABNORMAL LOW (ref 98–111)
Creatinine: 7.95 mg/dL (ref 0.61–1.24)
GFR, Est AFR Am: 8 mL/min — ABNORMAL LOW (ref >60)
GFR, Estimated: 7 mL/min — ABNORMAL LOW (ref >60)
Glucose, Bld: 131 mg/dL — ABNORMAL HIGH (ref 70–99)
Potassium: 3.5 mmol/L (ref 3.5–5.1)
Sodium: 137 mmol/L (ref 135–145)
Total Bilirubin: 1 mg/dL (ref 0.3–1.2)
Total Protein: 6.7 g/dL (ref 6.5–8.1)

## 2019-04-19 LAB — CBC WITH DIFFERENTIAL (CANCER CENTER ONLY)
Abs Immature Granulocytes: 0.02 10*3/uL (ref 0.00–0.07)
Basophils Absolute: 0.2 10*3/uL — ABNORMAL HIGH (ref 0.0–0.1)
Basophils Relative: 3 %
Eosinophils Absolute: 0.8 10*3/uL — ABNORMAL HIGH (ref 0.0–0.5)
Eosinophils Relative: 15 %
HCT: 28 % — ABNORMAL LOW (ref 39.0–52.0)
Hemoglobin: 10.3 g/dL — ABNORMAL LOW (ref 13.0–17.0)
Immature Granulocytes: 0 %
Lymphocytes Relative: 0 %
Lymphs Abs: 0 10*3/uL — ABNORMAL LOW (ref 0.7–4.0)
MCH: 33.1 pg (ref 26.0–34.0)
MCHC: 36.8 g/dL — ABNORMAL HIGH (ref 30.0–36.0)
MCV: 90 fL (ref 80.0–100.0)
Monocytes Absolute: 1 10*3/uL (ref 0.1–1.0)
Monocytes Relative: 19 %
Neutro Abs: 3.4 10*3/uL (ref 1.7–7.7)
Neutrophils Relative %: 63 %
Platelet Count: 122 10*3/uL — ABNORMAL LOW (ref 150–400)
RBC: 3.11 MIL/uL — ABNORMAL LOW (ref 4.22–5.81)
RDW: 17.3 % — ABNORMAL HIGH (ref 11.5–15.5)
WBC Count: 5.5 10*3/uL (ref 4.0–10.5)
nRBC: 17.9 % — ABNORMAL HIGH (ref 0.0–0.2)

## 2019-04-19 MED ORDER — BORTEZOMIB CHEMO SQ INJECTION 3.5 MG (2.5MG/ML)
1.5000 mg/m2 | Freq: Once | INTRAMUSCULAR | Status: AC
  Administered 2019-04-19: 3.25 mg via SUBCUTANEOUS
  Filled 2019-04-19: qty 1.3

## 2019-04-19 NOTE — Progress Notes (Signed)
Patient took own compazine prior to [facility] treatment.

## 2019-04-19 NOTE — Patient Instructions (Signed)
[facility] Discharge Instructions for Patients Receiving Chemotherapy  Today you received the following chemotherapy agents: Velcade   To help prevent nausea and vomiting after your treatment, we encourage you to take your nausea medication as directed.    If you develop nausea and vomiting that is not controlled by your nausea medication, call the clinic.   BELOW ARE SYMPTOMS THAT SHOULD BE REPORTED IMMEDIATELY:  *FEVER GREATER THAN 100.5 F  *CHILLS WITH OR WITHOUT FEVER  NAUSEA AND VOMITING THAT IS NOT CONTROLLED WITH YOUR NAUSEA MEDICATION  *UNUSUAL SHORTNESS OF BREATH  *UNUSUAL BRUISING OR BLEEDING  TENDERNESS IN MOUTH AND THROAT WITH OR WITHOUT PRESENCE OF ULCERS  *URINARY PROBLEMS  *BOWEL PROBLEMS  UNUSUAL RASH Items with * indicate a potential emergency and should be followed up as soon as possible.  Feel free to call the clinic should you have any questions or concerns. The clinic phone number is [phone].  Please show the [place] at check-in to the Emergency Department and triage nurse.

## 2019-04-19 NOTE — Progress Notes (Unsigned)
Critical creatinine of 7.95 reported to Leanne Chang, [place]

## 2019-04-20 DIAGNOSIS — D631 Anemia in chronic kidney disease: Secondary | ICD-10-CM | POA: Diagnosis not present

## 2019-04-20 DIAGNOSIS — Z992 Dependence on renal dialysis: Secondary | ICD-10-CM | POA: Diagnosis not present

## 2019-04-20 DIAGNOSIS — N186 End stage renal disease: Secondary | ICD-10-CM | POA: Diagnosis not present

## 2019-04-20 DIAGNOSIS — D509 Iron deficiency anemia, unspecified: Secondary | ICD-10-CM | POA: Diagnosis not present

## 2019-04-20 DIAGNOSIS — E1129 Type 2 diabetes mellitus with other diabetic kidney complication: Secondary | ICD-10-CM | POA: Diagnosis not present

## 2019-04-20 DIAGNOSIS — N2581 Secondary hyperparathyroidism of renal origin: Secondary | ICD-10-CM | POA: Diagnosis not present

## 2019-04-21 ENCOUNTER — Encounter (INDEPENDENT_AMBULATORY_CARE_PROVIDER_SITE_OTHER): Payer: 59 | Admitting: Ophthalmology

## 2019-04-21 DIAGNOSIS — Z992 Dependence on renal dialysis: Secondary | ICD-10-CM | POA: Diagnosis not present

## 2019-04-21 DIAGNOSIS — I2729 Other secondary pulmonary hypertension: Secondary | ICD-10-CM | POA: Diagnosis not present

## 2019-04-21 DIAGNOSIS — E1122 Type 2 diabetes mellitus with diabetic chronic kidney disease: Secondary | ICD-10-CM | POA: Diagnosis not present

## 2019-04-21 DIAGNOSIS — I5032 Chronic diastolic (congestive) heart failure: Secondary | ICD-10-CM | POA: Diagnosis not present

## 2019-04-21 DIAGNOSIS — I132 Hypertensive heart and chronic kidney disease with heart failure and with stage 5 chronic kidney disease, or end stage renal disease: Secondary | ICD-10-CM | POA: Diagnosis not present

## 2019-04-21 DIAGNOSIS — N186 End stage renal disease: Secondary | ICD-10-CM | POA: Diagnosis not present

## 2019-04-22 DIAGNOSIS — D509 Iron deficiency anemia, unspecified: Secondary | ICD-10-CM | POA: Diagnosis not present

## 2019-04-22 DIAGNOSIS — E1129 Type 2 diabetes mellitus with other diabetic kidney complication: Secondary | ICD-10-CM | POA: Diagnosis not present

## 2019-04-22 DIAGNOSIS — Z992 Dependence on renal dialysis: Secondary | ICD-10-CM | POA: Diagnosis not present

## 2019-04-22 DIAGNOSIS — D631 Anemia in chronic kidney disease: Secondary | ICD-10-CM | POA: Diagnosis not present

## 2019-04-22 DIAGNOSIS — N2581 Secondary hyperparathyroidism of renal origin: Secondary | ICD-10-CM | POA: Diagnosis not present

## 2019-04-22 DIAGNOSIS — N186 End stage renal disease: Secondary | ICD-10-CM | POA: Diagnosis not present

## 2019-04-25 ENCOUNTER — Other Ambulatory Visit: Payer: Self-pay | Admitting: Oncology

## 2019-04-25 ENCOUNTER — Other Ambulatory Visit: Payer: Self-pay | Admitting: Internal Medicine

## 2019-04-25 ENCOUNTER — Other Ambulatory Visit: Payer: Self-pay

## 2019-04-25 DIAGNOSIS — D631 Anemia in chronic kidney disease: Secondary | ICD-10-CM | POA: Diagnosis not present

## 2019-04-25 DIAGNOSIS — Z992 Dependence on renal dialysis: Secondary | ICD-10-CM | POA: Diagnosis not present

## 2019-04-25 DIAGNOSIS — D509 Iron deficiency anemia, unspecified: Secondary | ICD-10-CM | POA: Diagnosis not present

## 2019-04-25 DIAGNOSIS — C9000 Multiple myeloma not having achieved remission: Secondary | ICD-10-CM

## 2019-04-25 DIAGNOSIS — N186 End stage renal disease: Secondary | ICD-10-CM | POA: Diagnosis not present

## 2019-04-25 DIAGNOSIS — N2581 Secondary hyperparathyroidism of renal origin: Secondary | ICD-10-CM | POA: Diagnosis not present

## 2019-04-25 DIAGNOSIS — E1129 Type 2 diabetes mellitus with other diabetic kidney complication: Secondary | ICD-10-CM | POA: Diagnosis not present

## 2019-04-25 MED FILL — METOPROLOL TARTRATE 25 MG T: 25 | 90 days supply | Qty: 90 | Fill #0

## 2019-04-25 MED FILL — ACYCLOVIR 400 MG TABLET: 400 | 90 days supply | Qty: 90 | Fill #0

## 2019-04-25 MED FILL — PANTOPRAZOLE SOD DR 40 MG T: 40 | 90 days supply | Qty: 90 | Fill #0

## 2019-04-26 ENCOUNTER — Other Ambulatory Visit: Payer: Self-pay

## 2019-04-26 ENCOUNTER — Inpatient Hospital Stay: Payer: Medicare Other

## 2019-04-26 VITALS — BP 165/67 | HR 62 | Temp 98.2°F | Resp 18

## 2019-04-26 DIAGNOSIS — C9000 Multiple myeloma not having achieved remission: Secondary | ICD-10-CM | POA: Diagnosis not present

## 2019-04-26 DIAGNOSIS — Z5112 Encounter for antineoplastic immunotherapy: Secondary | ICD-10-CM | POA: Diagnosis not present

## 2019-04-26 LAB — CBC WITH DIFFERENTIAL (CANCER CENTER ONLY)
Abs Immature Granulocytes: 0.02 10*3/uL (ref 0.00–0.07)
Basophils Absolute: 0.1 10*3/uL (ref 0.0–0.1)
Basophils Relative: 3 %
Eosinophils Absolute: 1.5 10*3/uL — ABNORMAL HIGH (ref 0.0–0.5)
Eosinophils Relative: 31 %
HCT: 27.7 % — ABNORMAL LOW (ref 39.0–52.0)
Hemoglobin: 10.4 g/dL — ABNORMAL LOW (ref 13.0–17.0)
Immature Granulocytes: 0 %
Lymphocytes Relative: 0 %
Lymphs Abs: 0 10*3/uL — ABNORMAL LOW (ref 0.7–4.0)
MCH: 34 pg (ref 26.0–34.0)
MCHC: 37.5 g/dL — ABNORMAL HIGH (ref 30.0–36.0)
MCV: 90.5 fL (ref 80.0–100.0)
Monocytes Absolute: 0.9 10*3/uL (ref 0.1–1.0)
Monocytes Relative: 19 %
Neutro Abs: 2.2 10*3/uL (ref 1.7–7.7)
Neutrophils Relative %: 47 %
Platelet Count: 125 10*3/uL — ABNORMAL LOW (ref 150–400)
RBC: 3.06 MIL/uL — ABNORMAL LOW (ref 4.22–5.81)
RDW: 17.5 % — ABNORMAL HIGH (ref 11.5–15.5)
WBC Count: 4.7 10*3/uL (ref 4.0–10.5)
nRBC: 21.1 % — ABNORMAL HIGH (ref 0.0–0.2)

## 2019-04-26 LAB — CMP (CANCER CENTER ONLY)
ALT: 7 U/L (ref 0–44)
AST: 14 U/L — ABNORMAL LOW (ref 15–41)
Albumin: 3.7 g/dL (ref 3.5–5.0)
Alkaline Phosphatase: 80 U/L (ref 38–126)
Anion gap: 14 (ref 5–15)
BUN: 26 mg/dL — ABNORMAL HIGH (ref 6–20)
CO2: 31 mmol/L (ref 22–32)
Calcium: 8.4 mg/dL — ABNORMAL LOW (ref 8.9–10.3)
Chloride: 97 mmol/L — ABNORMAL LOW (ref 98–111)
Creatinine: 7.35 mg/dL (ref 0.61–1.24)
GFR, Est AFR Am: 9 mL/min — ABNORMAL LOW (ref >60)
GFR, Estimated: 8 mL/min — ABNORMAL LOW (ref >60)
Glucose, Bld: 120 mg/dL — ABNORMAL HIGH (ref 70–99)
Potassium: 4.3 mmol/L (ref 3.5–5.1)
Sodium: 142 mmol/L (ref 135–145)
Total Bilirubin: 0.8 mg/dL (ref 0.3–1.2)
Total Protein: 6.6 g/dL (ref 6.5–8.1)

## 2019-04-26 MED ORDER — BORTEZOMIB CHEMO SQ INJECTION 3.5 MG (2.5MG/ML)
1.5000 mg/m2 | Freq: Once | INTRAMUSCULAR | Status: AC
  Administered 2019-04-26: 3.25 mg via SUBCUTANEOUS
  Filled 2019-04-26: qty 1.3

## 2019-04-26 MED ORDER — PROCHLORPERAZINE MALEATE 10 MG PO TABS: 10.0000 mg | ORAL_TABLET | Freq: Once | ORAL | Status: DC

## 2019-04-26 NOTE — Patient Instructions (Signed)
[facility] Discharge Instructions for Patients Receiving Chemotherapy  Today you received the following chemotherapy agents: Velcade   To help prevent nausea and vomiting after your treatment, we encourage you to take your nausea medication as directed.  If you develop nausea and vomiting that is not controlled by your nausea medication, call the clinic.   BELOW ARE SYMPTOMS THAT SHOULD BE REPORTED IMMEDIATELY:  *FEVER GREATER THAN 100.5 F  *CHILLS WITH OR WITHOUT FEVER  NAUSEA AND VOMITING THAT IS NOT CONTROLLED WITH YOUR NAUSEA MEDICATION  *UNUSUAL SHORTNESS OF BREATH  *UNUSUAL BRUISING OR BLEEDING  TENDERNESS IN MOUTH AND THROAT WITH OR WITHOUT PRESENCE OF ULCERS  *URINARY PROBLEMS  *BOWEL PROBLEMS  UNUSUAL RASH Items with * indicate a potential emergency and should be followed up as soon as possible.  Feel free to call the clinic should you have any questions or concerns. The clinic phone number is [phone].  Please show the [facility] [place] at check-in to the Emergency Department and triage nurse.

## 2019-04-26 NOTE — Progress Notes (Signed)
Reva in lab called with critical Creatinine level of 7.35. Did not notify MD as this has been lower than previous for this patient and MD order in treatment plan said proceed regardless of Creatinine level.

## 2019-04-27 DIAGNOSIS — N2581 Secondary hyperparathyroidism of renal origin: Secondary | ICD-10-CM | POA: Diagnosis not present

## 2019-04-27 DIAGNOSIS — D509 Iron deficiency anemia, unspecified: Secondary | ICD-10-CM | POA: Diagnosis not present

## 2019-04-27 DIAGNOSIS — Z992 Dependence on renal dialysis: Secondary | ICD-10-CM | POA: Diagnosis not present

## 2019-04-27 DIAGNOSIS — N186 End stage renal disease: Secondary | ICD-10-CM | POA: Diagnosis not present

## 2019-04-27 DIAGNOSIS — E1129 Type 2 diabetes mellitus with other diabetic kidney complication: Secondary | ICD-10-CM | POA: Diagnosis not present

## 2019-04-27 DIAGNOSIS — D631 Anemia in chronic kidney disease: Secondary | ICD-10-CM | POA: Diagnosis not present

## 2019-04-27 MED FILL — CYCLOPHOSPHAMIDE 50 MG CAPS: 50 | 28 days supply | Qty: 52 | Fill #0

## 2019-04-29 DIAGNOSIS — E1129 Type 2 diabetes mellitus with other diabetic kidney complication: Secondary | ICD-10-CM | POA: Diagnosis not present

## 2019-04-29 DIAGNOSIS — N2581 Secondary hyperparathyroidism of renal origin: Secondary | ICD-10-CM | POA: Diagnosis not present

## 2019-04-29 DIAGNOSIS — D509 Iron deficiency anemia, unspecified: Secondary | ICD-10-CM | POA: Diagnosis not present

## 2019-04-29 DIAGNOSIS — N186 End stage renal disease: Secondary | ICD-10-CM | POA: Diagnosis not present

## 2019-04-29 DIAGNOSIS — D631 Anemia in chronic kidney disease: Secondary | ICD-10-CM | POA: Diagnosis not present

## 2019-04-29 DIAGNOSIS — Z992 Dependence on renal dialysis: Secondary | ICD-10-CM | POA: Diagnosis not present

## 2019-04-29 MED FILL — ZARXIO 480 MCG/0.8ML SOSY: 480 | 3 days supply | Qty: 5 | Fill #0

## 2019-05-02 ENCOUNTER — Telehealth: Payer: Self-pay

## 2019-05-02 DIAGNOSIS — Z992 Dependence on renal dialysis: Secondary | ICD-10-CM | POA: Diagnosis not present

## 2019-05-02 DIAGNOSIS — D631 Anemia in chronic kidney disease: Secondary | ICD-10-CM | POA: Diagnosis not present

## 2019-05-02 DIAGNOSIS — N186 End stage renal disease: Secondary | ICD-10-CM | POA: Diagnosis not present

## 2019-05-02 DIAGNOSIS — D509 Iron deficiency anemia, unspecified: Secondary | ICD-10-CM | POA: Diagnosis not present

## 2019-05-02 DIAGNOSIS — N2581 Secondary hyperparathyroidism of renal origin: Secondary | ICD-10-CM | POA: Diagnosis not present

## 2019-05-02 DIAGNOSIS — E1129 Type 2 diabetes mellitus with other diabetic kidney complication: Secondary | ICD-10-CM | POA: Diagnosis not present

## 2019-05-02 NOTE — Telephone Encounter (Signed)
-----   Message from Wyatt Portela, MD sent at 05/02/2019 10:29 AM EDT ----- Regarding: RE: appts No need. She can cancel 9/1 unless they have questions. Thanks.  ----- Message ----- From: Scot Dock, RN Sent: 05/02/2019  10:23 AM EDT To: Wyatt Portela, MD Subject: appts                                          Per [facility] patient is to cancel all chemo tx due to stem cell/bone marrow transplant scheduled for Sept. I will cancel INF appts. Spouse would like to know if they are to keep the MD appt on 9/1. Please advise.

## 2019-05-02 NOTE — Telephone Encounter (Signed)
Follow up call to patient spouse regarding MD appointment on 9/1. Spouse stated that the MD appt can be cancelled at this time in addition to all the other appt's for 9/1 and 9/8. Spouse stated that they will call back once they receive further direction from [name].

## 2019-05-03 ENCOUNTER — Other Ambulatory Visit: Payer: Medicare Other

## 2019-05-03 ENCOUNTER — Ambulatory Visit: Payer: Medicare Other

## 2019-05-04 DIAGNOSIS — D509 Iron deficiency anemia, unspecified: Secondary | ICD-10-CM | POA: Diagnosis not present

## 2019-05-04 DIAGNOSIS — E1129 Type 2 diabetes mellitus with other diabetic kidney complication: Secondary | ICD-10-CM | POA: Diagnosis not present

## 2019-05-04 DIAGNOSIS — D631 Anemia in chronic kidney disease: Secondary | ICD-10-CM | POA: Diagnosis not present

## 2019-05-04 DIAGNOSIS — Z992 Dependence on renal dialysis: Secondary | ICD-10-CM | POA: Diagnosis not present

## 2019-05-04 DIAGNOSIS — N186 End stage renal disease: Secondary | ICD-10-CM | POA: Diagnosis not present

## 2019-05-04 DIAGNOSIS — N2581 Secondary hyperparathyroidism of renal origin: Secondary | ICD-10-CM | POA: Diagnosis not present

## 2019-05-05 ENCOUNTER — Encounter (INDEPENDENT_AMBULATORY_CARE_PROVIDER_SITE_OTHER): Payer: Medicare Other | Admitting: Ophthalmology

## 2019-05-05 ENCOUNTER — Other Ambulatory Visit: Payer: Self-pay

## 2019-05-05 DIAGNOSIS — H43813 Vitreous degeneration, bilateral: Secondary | ICD-10-CM

## 2019-05-05 DIAGNOSIS — H44523 Atrophy of globe, bilateral: Secondary | ICD-10-CM | POA: Diagnosis not present

## 2019-05-05 DIAGNOSIS — H35033 Hypertensive retinopathy, bilateral: Secondary | ICD-10-CM | POA: Diagnosis not present

## 2019-05-05 DIAGNOSIS — H2513 Age-related nuclear cataract, bilateral: Secondary | ICD-10-CM | POA: Diagnosis not present

## 2019-05-05 DIAGNOSIS — I10 Essential (primary) hypertension: Secondary | ICD-10-CM | POA: Diagnosis not present

## 2019-05-06 DIAGNOSIS — D509 Iron deficiency anemia, unspecified: Secondary | ICD-10-CM | POA: Diagnosis not present

## 2019-05-06 DIAGNOSIS — N186 End stage renal disease: Secondary | ICD-10-CM | POA: Diagnosis not present

## 2019-05-06 DIAGNOSIS — Z992 Dependence on renal dialysis: Secondary | ICD-10-CM | POA: Diagnosis not present

## 2019-05-06 DIAGNOSIS — N2581 Secondary hyperparathyroidism of renal origin: Secondary | ICD-10-CM | POA: Diagnosis not present

## 2019-05-06 DIAGNOSIS — E1129 Type 2 diabetes mellitus with other diabetic kidney complication: Secondary | ICD-10-CM | POA: Diagnosis not present

## 2019-05-06 DIAGNOSIS — D631 Anemia in chronic kidney disease: Secondary | ICD-10-CM | POA: Diagnosis not present

## 2019-05-09 DIAGNOSIS — D631 Anemia in chronic kidney disease: Secondary | ICD-10-CM | POA: Diagnosis not present

## 2019-05-09 DIAGNOSIS — N186 End stage renal disease: Secondary | ICD-10-CM | POA: Diagnosis not present

## 2019-05-09 DIAGNOSIS — D509 Iron deficiency anemia, unspecified: Secondary | ICD-10-CM | POA: Diagnosis not present

## 2019-05-09 DIAGNOSIS — E1129 Type 2 diabetes mellitus with other diabetic kidney complication: Secondary | ICD-10-CM | POA: Diagnosis not present

## 2019-05-09 DIAGNOSIS — N2581 Secondary hyperparathyroidism of renal origin: Secondary | ICD-10-CM | POA: Diagnosis not present

## 2019-05-09 DIAGNOSIS — Z992 Dependence on renal dialysis: Secondary | ICD-10-CM | POA: Diagnosis not present

## 2019-05-10 ENCOUNTER — Ambulatory Visit: Payer: Medicare Other

## 2019-05-10 ENCOUNTER — Ambulatory Visit: Payer: Medicare Other | Admitting: Oncology

## 2019-05-10 ENCOUNTER — Other Ambulatory Visit: Payer: Medicare Other

## 2019-05-10 DIAGNOSIS — S065X9A Traumatic subdural hemorrhage with loss of consciousness of unspecified duration, initial encounter: Secondary | ICD-10-CM

## 2019-05-10 DIAGNOSIS — S065XAA Traumatic subdural hemorrhage with loss of consciousness status unknown, initial encounter: Secondary | ICD-10-CM

## 2019-05-10 HISTORY — DX: Traumatic subdural hemorrhage with loss of consciousness of unspecified duration, initial encounter: S06.5X9A

## 2019-05-10 HISTORY — DX: Traumatic subdural hemorrhage with loss of consciousness status unknown, initial encounter: S06.5XAA

## 2019-05-11 DIAGNOSIS — D509 Iron deficiency anemia, unspecified: Secondary | ICD-10-CM | POA: Diagnosis not present

## 2019-05-11 DIAGNOSIS — E1129 Type 2 diabetes mellitus with other diabetic kidney complication: Secondary | ICD-10-CM | POA: Diagnosis not present

## 2019-05-11 DIAGNOSIS — D631 Anemia in chronic kidney disease: Secondary | ICD-10-CM | POA: Diagnosis not present

## 2019-05-11 DIAGNOSIS — N2581 Secondary hyperparathyroidism of renal origin: Secondary | ICD-10-CM | POA: Diagnosis not present

## 2019-05-11 DIAGNOSIS — Z992 Dependence on renal dialysis: Secondary | ICD-10-CM | POA: Diagnosis not present

## 2019-05-11 DIAGNOSIS — N186 End stage renal disease: Secondary | ICD-10-CM | POA: Diagnosis not present

## 2019-05-12 DIAGNOSIS — Z01818 Encounter for other preprocedural examination: Secondary | ICD-10-CM | POA: Diagnosis not present

## 2019-05-12 DIAGNOSIS — K219 Gastro-esophageal reflux disease without esophagitis: Secondary | ICD-10-CM | POA: Diagnosis not present

## 2019-05-12 DIAGNOSIS — I5032 Chronic diastolic (congestive) heart failure: Secondary | ICD-10-CM | POA: Diagnosis not present

## 2019-05-12 DIAGNOSIS — D5740 Sickle-cell thalassemia without crisis: Secondary | ICD-10-CM | POA: Diagnosis not present

## 2019-05-12 DIAGNOSIS — Z7901 Long term (current) use of anticoagulants: Secondary | ICD-10-CM | POA: Diagnosis not present

## 2019-05-12 DIAGNOSIS — I071 Rheumatic tricuspid insufficiency: Secondary | ICD-10-CM | POA: Diagnosis not present

## 2019-05-12 DIAGNOSIS — E669 Obesity, unspecified: Secondary | ICD-10-CM | POA: Diagnosis not present

## 2019-05-12 DIAGNOSIS — I482 Chronic atrial fibrillation, unspecified: Secondary | ICD-10-CM | POA: Diagnosis not present

## 2019-05-12 DIAGNOSIS — C9000 Multiple myeloma not having achieved remission: Secondary | ICD-10-CM | POA: Diagnosis not present

## 2019-05-12 DIAGNOSIS — E1122 Type 2 diabetes mellitus with diabetic chronic kidney disease: Secondary | ICD-10-CM | POA: Diagnosis not present

## 2019-05-12 DIAGNOSIS — Z9049 Acquired absence of other specified parts of digestive tract: Secondary | ICD-10-CM | POA: Diagnosis not present

## 2019-05-12 DIAGNOSIS — Z20828 Contact with and (suspected) exposure to other viral communicable diseases: Secondary | ICD-10-CM | POA: Diagnosis not present

## 2019-05-12 DIAGNOSIS — Z9484 Stem cells transplant status: Secondary | ICD-10-CM | POA: Diagnosis not present

## 2019-05-12 DIAGNOSIS — E1065 Type 1 diabetes mellitus with hyperglycemia: Secondary | ICD-10-CM | POA: Diagnosis not present

## 2019-05-12 DIAGNOSIS — Z992 Dependence on renal dialysis: Secondary | ICD-10-CM | POA: Diagnosis not present

## 2019-05-12 DIAGNOSIS — R9431 Abnormal electrocardiogram [ECG] [EKG]: Secondary | ICD-10-CM | POA: Diagnosis not present

## 2019-05-12 DIAGNOSIS — Z114 Encounter for screening for human immunodeficiency virus [HIV]: Secondary | ICD-10-CM | POA: Diagnosis not present

## 2019-05-12 DIAGNOSIS — N186 End stage renal disease: Secondary | ICD-10-CM | POA: Diagnosis not present

## 2019-05-12 DIAGNOSIS — Z8673 Personal history of transient ischemic attack (TIA), and cerebral infarction without residual deficits: Secondary | ICD-10-CM | POA: Diagnosis not present

## 2019-05-12 DIAGNOSIS — I361 Nonrheumatic tricuspid (valve) insufficiency: Secondary | ICD-10-CM | POA: Diagnosis not present

## 2019-05-12 DIAGNOSIS — Z794 Long term (current) use of insulin: Secondary | ICD-10-CM | POA: Diagnosis not present

## 2019-05-12 DIAGNOSIS — Z6828 Body mass index (BMI) 28.0-28.9, adult: Secondary | ICD-10-CM | POA: Diagnosis not present

## 2019-05-12 DIAGNOSIS — J449 Chronic obstructive pulmonary disease, unspecified: Secondary | ICD-10-CM | POA: Diagnosis not present

## 2019-05-12 DIAGNOSIS — G40909 Epilepsy, unspecified, not intractable, without status epilepticus: Secondary | ICD-10-CM | POA: Diagnosis not present

## 2019-05-12 DIAGNOSIS — M8580 Other specified disorders of bone density and structure, unspecified site: Secondary | ICD-10-CM | POA: Diagnosis not present

## 2019-05-12 DIAGNOSIS — Z7982 Long term (current) use of aspirin: Secondary | ICD-10-CM | POA: Diagnosis not present

## 2019-05-12 DIAGNOSIS — I132 Hypertensive heart and chronic kidney disease with heart failure and with stage 5 chronic kidney disease, or end stage renal disease: Secondary | ICD-10-CM | POA: Diagnosis not present

## 2019-05-13 DIAGNOSIS — Z992 Dependence on renal dialysis: Secondary | ICD-10-CM | POA: Diagnosis not present

## 2019-05-13 DIAGNOSIS — N2581 Secondary hyperparathyroidism of renal origin: Secondary | ICD-10-CM | POA: Diagnosis not present

## 2019-05-13 DIAGNOSIS — D631 Anemia in chronic kidney disease: Secondary | ICD-10-CM | POA: Diagnosis not present

## 2019-05-13 DIAGNOSIS — D509 Iron deficiency anemia, unspecified: Secondary | ICD-10-CM | POA: Diagnosis not present

## 2019-05-13 DIAGNOSIS — E1129 Type 2 diabetes mellitus with other diabetic kidney complication: Secondary | ICD-10-CM | POA: Diagnosis not present

## 2019-05-13 DIAGNOSIS — N186 End stage renal disease: Secondary | ICD-10-CM | POA: Diagnosis not present

## 2019-05-16 DIAGNOSIS — D631 Anemia in chronic kidney disease: Secondary | ICD-10-CM | POA: Diagnosis not present

## 2019-05-16 DIAGNOSIS — N186 End stage renal disease: Secondary | ICD-10-CM | POA: Diagnosis not present

## 2019-05-16 DIAGNOSIS — E1129 Type 2 diabetes mellitus with other diabetic kidney complication: Secondary | ICD-10-CM | POA: Diagnosis not present

## 2019-05-16 DIAGNOSIS — Z992 Dependence on renal dialysis: Secondary | ICD-10-CM | POA: Diagnosis not present

## 2019-05-16 DIAGNOSIS — D509 Iron deficiency anemia, unspecified: Secondary | ICD-10-CM | POA: Diagnosis not present

## 2019-05-16 DIAGNOSIS — N2581 Secondary hyperparathyroidism of renal origin: Secondary | ICD-10-CM | POA: Diagnosis not present

## 2019-05-17 ENCOUNTER — Other Ambulatory Visit: Payer: Medicare Other

## 2019-05-17 ENCOUNTER — Ambulatory Visit: Payer: Medicare Other

## 2019-05-17 ENCOUNTER — Telehealth: Payer: Self-pay | Admitting: Neurology

## 2019-05-17 DIAGNOSIS — C9000 Multiple myeloma not having achieved remission: Secondary | ICD-10-CM | POA: Diagnosis not present

## 2019-05-17 DIAGNOSIS — Z452 Encounter for adjustment and management of vascular access device: Secondary | ICD-10-CM | POA: Diagnosis not present

## 2019-05-17 DIAGNOSIS — Z52011 Autologous donor, stem cells: Secondary | ICD-10-CM | POA: Diagnosis not present

## 2019-05-17 NOTE — Telephone Encounter (Signed)
Patient's wife would like to talk to someone about her Husband having a seizure last Friday. They were at [name] Thursday having test done she thinks his levels needs to be checked again please call

## 2019-05-17 NOTE — Telephone Encounter (Signed)
Spoke to wife. He is getting ready to do stem cell tx at [facility] and was there all day Thursday, needed wheelchair for the rest of the afternoon. Friday went to dialysis, right after dialysis he again had a seizure. Wife came and he was coming out of it, gave additional Vimpat. Rested weekend, went to HD Monday and did fine. Will be admitted on 9/17 until Oct where he will have bone marrow transplant and stem cell. Discussed that seizure was most likely from fatigue the day prior since he had been doing so well on current regimen. Can check Lamotrigine level with his next blood draw. No change in meds for now.

## 2019-05-18 ENCOUNTER — Telehealth: Payer: Self-pay | Admitting: Neurology

## 2019-05-18 DIAGNOSIS — C9000 Multiple myeloma not having achieved remission: Secondary | ICD-10-CM | POA: Diagnosis not present

## 2019-05-18 DIAGNOSIS — Z6710 Type A blood, Rh positive: Secondary | ICD-10-CM | POA: Diagnosis not present

## 2019-05-18 DIAGNOSIS — Z01818 Encounter for other preprocedural examination: Secondary | ICD-10-CM | POA: Diagnosis not present

## 2019-05-18 NOTE — Telephone Encounter (Signed)
Patient's spouse, [name], called and scheduled a blood test for the patient on 05/20/2019 at 4:00. Routing to clinical staff for order entry.

## 2019-05-19 ENCOUNTER — Other Ambulatory Visit: Payer: Self-pay

## 2019-05-19 DIAGNOSIS — Z5181 Encounter for therapeutic drug level monitoring: Secondary | ICD-10-CM

## 2019-05-19 DIAGNOSIS — G40209 Localization-related (focal) (partial) symptomatic epilepsy and epileptic syndromes with complex partial seizures, not intractable, without status epilepticus: Secondary | ICD-10-CM

## 2019-05-19 DIAGNOSIS — C9000 Multiple myeloma not having achieved remission: Secondary | ICD-10-CM | POA: Diagnosis not present

## 2019-05-19 NOTE — Telephone Encounter (Signed)
Lamictal level ordered per Delice Lesch

## 2019-05-20 ENCOUNTER — Telehealth: Payer: Self-pay | Admitting: Neurology

## 2019-05-20 ENCOUNTER — Other Ambulatory Visit: Payer: Self-pay

## 2019-05-20 ENCOUNTER — Other Ambulatory Visit (INDEPENDENT_AMBULATORY_CARE_PROVIDER_SITE_OTHER): Payer: Medicare Other

## 2019-05-20 ENCOUNTER — Encounter: Payer: Self-pay | Admitting: Neurology

## 2019-05-20 DIAGNOSIS — G40209 Localization-related (focal) (partial) symptomatic epilepsy and epileptic syndromes with complex partial seizures, not intractable, without status epilepticus: Secondary | ICD-10-CM

## 2019-05-20 DIAGNOSIS — D509 Iron deficiency anemia, unspecified: Secondary | ICD-10-CM | POA: Diagnosis not present

## 2019-05-20 DIAGNOSIS — Z992 Dependence on renal dialysis: Secondary | ICD-10-CM | POA: Diagnosis not present

## 2019-05-20 DIAGNOSIS — N2581 Secondary hyperparathyroidism of renal origin: Secondary | ICD-10-CM | POA: Diagnosis not present

## 2019-05-20 DIAGNOSIS — N186 End stage renal disease: Secondary | ICD-10-CM | POA: Diagnosis not present

## 2019-05-20 DIAGNOSIS — Z5181 Encounter for therapeutic drug level monitoring: Secondary | ICD-10-CM | POA: Diagnosis not present

## 2019-05-20 DIAGNOSIS — D631 Anemia in chronic kidney disease: Secondary | ICD-10-CM | POA: Diagnosis not present

## 2019-05-20 DIAGNOSIS — E1129 Type 2 diabetes mellitus with other diabetic kidney complication: Secondary | ICD-10-CM | POA: Diagnosis not present

## 2019-05-20 NOTE — Telephone Encounter (Signed)
Dr. Posey Pronto,  Could you review this patient for Dr. Delice Lesch? I am not sure if you will be able to type up a letter. If not, I will inform pt's wife that we needed an appropriate time frame to have this completed.

## 2019-05-20 NOTE — Telephone Encounter (Signed)
Letter done

## 2019-05-20 NOTE — Telephone Encounter (Signed)
Patient's wife, [name], called and requested a letter/note to be picked up today or early next week.   She said it will need to state that the patient is okay to proceed with a stem cell and bone marrow transplant at [name] next Thursday, 05/26/2019.  They need this letter in order to proceed with the procedure at [facility].  Patient's wife stated she'd like to pick it up today or early next week. She is aware Dr. Delice Lesch is on vacation.  Marking as high priority due to short timeline to procedure.

## 2019-05-23 ENCOUNTER — Telehealth: Payer: Self-pay | Admitting: Neurology

## 2019-05-23 DIAGNOSIS — N186 End stage renal disease: Secondary | ICD-10-CM | POA: Diagnosis not present

## 2019-05-23 DIAGNOSIS — N2581 Secondary hyperparathyroidism of renal origin: Secondary | ICD-10-CM | POA: Diagnosis not present

## 2019-05-23 DIAGNOSIS — E1129 Type 2 diabetes mellitus with other diabetic kidney complication: Secondary | ICD-10-CM | POA: Diagnosis not present

## 2019-05-23 DIAGNOSIS — D509 Iron deficiency anemia, unspecified: Secondary | ICD-10-CM | POA: Diagnosis not present

## 2019-05-23 DIAGNOSIS — Z992 Dependence on renal dialysis: Secondary | ICD-10-CM | POA: Diagnosis not present

## 2019-05-23 DIAGNOSIS — D631 Anemia in chronic kidney disease: Secondary | ICD-10-CM | POA: Diagnosis not present

## 2019-05-23 LAB — LAMOTRIGINE LEVEL: Lamotrigine Lvl: 6.5 ug/mL (ref 4.0–18.0)

## 2019-05-23 NOTE — Telephone Encounter (Signed)
Patient's wife, [name], called to see if the patient's blood work results are back yet.

## 2019-05-24 NOTE — Telephone Encounter (Signed)
Wife informed that Lamictal level was within normal limits and can continue the Lamotrigine per Dr. Posey Pronto. Wife states that the pt had another seizure yesterday soon after dialysis. She believes pt is just tired and that is what caused it. Pt has an appt with [facility] tomorrow.

## 2019-05-25 ENCOUNTER — Telehealth: Payer: Self-pay | Admitting: Internal Medicine

## 2019-05-25 DIAGNOSIS — Z01818 Encounter for other preprocedural examination: Secondary | ICD-10-CM | POA: Diagnosis not present

## 2019-05-25 DIAGNOSIS — C9000 Multiple myeloma not having achieved remission: Secondary | ICD-10-CM | POA: Diagnosis not present

## 2019-05-25 NOTE — Chronic Care Management (AMB) (Signed)
°  Chronic Care Management   Outreach Note  05/25/2019 Name: Isaiah [name] MRN: [id number] DOB: 15-Nov-1965  Referred by: [name], MD Reason for referral : Chronic Care Management (Initial CCM outreach was unsuccessful.)   An unsuccessful telephone outreach was attempted today. The patient was referred to the case management team by for assistance with chronic care management and care coordination.   Follow Up Plan: A HIPPA compliant phone message was left for the patient providing contact information and requesting a return call.  The care management team will reach out to the patient again over the next 7 days.  If patient returns call to provider office, please advise to call [place] at [place]  [email or web address]   ??[phone]

## 2019-05-26 DIAGNOSIS — K219 Gastro-esophageal reflux disease without esophagitis: Secondary | ICD-10-CM | POA: Diagnosis not present

## 2019-05-26 DIAGNOSIS — N186 End stage renal disease: Secondary | ICD-10-CM | POA: Diagnosis not present

## 2019-05-26 DIAGNOSIS — R569 Unspecified convulsions: Secondary | ICD-10-CM | POA: Diagnosis not present

## 2019-05-26 DIAGNOSIS — E213 Hyperparathyroidism, unspecified: Secondary | ICD-10-CM | POA: Diagnosis not present

## 2019-05-26 DIAGNOSIS — L723 Sebaceous cyst: Secondary | ICD-10-CM | POA: Diagnosis not present

## 2019-05-26 DIAGNOSIS — D61810 Antineoplastic chemotherapy induced pancytopenia: Secondary | ICD-10-CM | POA: Diagnosis present

## 2019-05-26 DIAGNOSIS — Z992 Dependence on renal dialysis: Secondary | ICD-10-CM | POA: Diagnosis not present

## 2019-05-26 DIAGNOSIS — I69320 Aphasia following cerebral infarction: Secondary | ICD-10-CM | POA: Diagnosis not present

## 2019-05-26 DIAGNOSIS — A4159 Other Gram-negative sepsis: Secondary | ICD-10-CM | POA: Diagnosis not present

## 2019-05-26 DIAGNOSIS — C9000 Multiple myeloma not having achieved remission: Secondary | ICD-10-CM | POA: Diagnosis not present

## 2019-05-26 DIAGNOSIS — R7881 Bacteremia: Secondary | ICD-10-CM | POA: Diagnosis not present

## 2019-05-26 DIAGNOSIS — I639 Cerebral infarction, unspecified: Secondary | ICD-10-CM | POA: Diagnosis not present

## 2019-05-26 DIAGNOSIS — R4182 Altered mental status, unspecified: Secondary | ICD-10-CM | POA: Diagnosis not present

## 2019-05-26 DIAGNOSIS — Q211 Atrial septal defect: Secondary | ICD-10-CM | POA: Diagnosis not present

## 2019-05-26 DIAGNOSIS — Z9484 Stem cells transplant status: Secondary | ICD-10-CM | POA: Diagnosis not present

## 2019-05-26 DIAGNOSIS — I482 Chronic atrial fibrillation, unspecified: Secondary | ICD-10-CM | POA: Diagnosis not present

## 2019-05-26 DIAGNOSIS — E872 Acidosis: Secondary | ICD-10-CM | POA: Diagnosis not present

## 2019-05-26 DIAGNOSIS — T451X5A Adverse effect of antineoplastic and immunosuppressive drugs, initial encounter: Secondary | ICD-10-CM | POA: Diagnosis present

## 2019-05-26 DIAGNOSIS — N200 Calculus of kidney: Secondary | ICD-10-CM | POA: Diagnosis not present

## 2019-05-26 DIAGNOSIS — K2971 Gastritis, unspecified, with bleeding: Secondary | ICD-10-CM | POA: Diagnosis not present

## 2019-05-26 DIAGNOSIS — I120 Hypertensive chronic kidney disease with stage 5 chronic kidney disease or end stage renal disease: Secondary | ICD-10-CM | POA: Diagnosis not present

## 2019-05-26 DIAGNOSIS — R1312 Dysphagia, oropharyngeal phase: Secondary | ICD-10-CM | POA: Diagnosis not present

## 2019-05-26 DIAGNOSIS — I5042 Chronic combined systolic (congestive) and diastolic (congestive) heart failure: Secondary | ICD-10-CM | POA: Diagnosis present

## 2019-05-26 DIAGNOSIS — Z20828 Contact with and (suspected) exposure to other viral communicable diseases: Secondary | ICD-10-CM | POA: Diagnosis present

## 2019-05-26 DIAGNOSIS — G40909 Epilepsy, unspecified, not intractable, without status epilepticus: Secondary | ICD-10-CM | POA: Diagnosis not present

## 2019-05-26 DIAGNOSIS — I63019 Cerebral infarction due to thrombosis of unspecified vertebral artery: Secondary | ICD-10-CM | POA: Diagnosis not present

## 2019-05-26 DIAGNOSIS — I132 Hypertensive heart and chronic kidney disease with heart failure and with stage 5 chronic kidney disease, or end stage renal disease: Secondary | ICD-10-CM | POA: Diagnosis present

## 2019-05-26 DIAGNOSIS — J189 Pneumonia, unspecified organism: Secondary | ICD-10-CM | POA: Diagnosis not present

## 2019-05-26 DIAGNOSIS — J690 Pneumonitis due to inhalation of food and vomit: Secondary | ICD-10-CM | POA: Diagnosis not present

## 2019-05-26 DIAGNOSIS — I69351 Hemiplegia and hemiparesis following cerebral infarction affecting right dominant side: Secondary | ICD-10-CM | POA: Diagnosis not present

## 2019-05-26 DIAGNOSIS — D691 Qualitative platelet defects: Secondary | ICD-10-CM | POA: Diagnosis present

## 2019-05-26 DIAGNOSIS — E1122 Type 2 diabetes mellitus with diabetic chronic kidney disease: Secondary | ICD-10-CM | POA: Diagnosis not present

## 2019-05-26 DIAGNOSIS — J90 Pleural effusion, not elsewhere classified: Secondary | ICD-10-CM | POA: Diagnosis not present

## 2019-05-26 DIAGNOSIS — I272 Pulmonary hypertension, unspecified: Secondary | ICD-10-CM | POA: Diagnosis not present

## 2019-05-26 DIAGNOSIS — I10 Essential (primary) hypertension: Secondary | ICD-10-CM | POA: Diagnosis not present

## 2019-05-26 DIAGNOSIS — I69392 Facial weakness following cerebral infarction: Secondary | ICD-10-CM | POA: Diagnosis not present

## 2019-05-26 DIAGNOSIS — I69398 Other sequelae of cerebral infarction: Secondary | ICD-10-CM | POA: Diagnosis not present

## 2019-05-26 DIAGNOSIS — I6203 Nontraumatic chronic subdural hemorrhage: Secondary | ICD-10-CM | POA: Diagnosis not present

## 2019-05-26 DIAGNOSIS — A4150 Gram-negative sepsis, unspecified: Secondary | ICD-10-CM | POA: Diagnosis not present

## 2019-05-26 DIAGNOSIS — I6200 Nontraumatic subdural hemorrhage, unspecified: Secondary | ICD-10-CM | POA: Diagnosis not present

## 2019-05-26 DIAGNOSIS — S065X9A Traumatic subdural hemorrhage with loss of consciousness of unspecified duration, initial encounter: Secondary | ICD-10-CM | POA: Diagnosis not present

## 2019-05-26 DIAGNOSIS — I6201 Nontraumatic acute subdural hemorrhage: Secondary | ICD-10-CM | POA: Diagnosis not present

## 2019-05-26 DIAGNOSIS — B964 Proteus (mirabilis) (morganii) as the cause of diseases classified elsewhere: Secondary | ICD-10-CM | POA: Diagnosis not present

## 2019-05-26 DIAGNOSIS — R918 Other nonspecific abnormal finding of lung field: Secondary | ICD-10-CM | POA: Diagnosis not present

## 2019-05-26 DIAGNOSIS — D5740 Sickle-cell thalassemia without crisis: Secondary | ICD-10-CM | POA: Diagnosis present

## 2019-05-27 DIAGNOSIS — E213 Hyperparathyroidism, unspecified: Secondary | ICD-10-CM | POA: Diagnosis not present

## 2019-05-27 DIAGNOSIS — I272 Pulmonary hypertension, unspecified: Secondary | ICD-10-CM | POA: Diagnosis not present

## 2019-05-27 DIAGNOSIS — R1312 Dysphagia, oropharyngeal phase: Secondary | ICD-10-CM | POA: Diagnosis not present

## 2019-05-27 DIAGNOSIS — I120 Hypertensive chronic kidney disease with stage 5 chronic kidney disease or end stage renal disease: Secondary | ICD-10-CM | POA: Diagnosis not present

## 2019-05-27 DIAGNOSIS — I6200 Nontraumatic subdural hemorrhage, unspecified: Secondary | ICD-10-CM | POA: Diagnosis not present

## 2019-05-27 DIAGNOSIS — I6203 Nontraumatic chronic subdural hemorrhage: Secondary | ICD-10-CM | POA: Diagnosis not present

## 2019-05-27 DIAGNOSIS — Q211 Atrial septal defect: Secondary | ICD-10-CM | POA: Diagnosis not present

## 2019-05-27 DIAGNOSIS — I63019 Cerebral infarction due to thrombosis of unspecified vertebral artery: Secondary | ICD-10-CM | POA: Diagnosis not present

## 2019-05-27 DIAGNOSIS — I6201 Nontraumatic acute subdural hemorrhage: Secondary | ICD-10-CM | POA: Diagnosis not present

## 2019-05-27 DIAGNOSIS — I10 Essential (primary) hypertension: Secondary | ICD-10-CM | POA: Diagnosis not present

## 2019-05-27 DIAGNOSIS — K219 Gastro-esophageal reflux disease without esophagitis: Secondary | ICD-10-CM | POA: Diagnosis not present

## 2019-05-27 DIAGNOSIS — G40909 Epilepsy, unspecified, not intractable, without status epilepticus: Secondary | ICD-10-CM | POA: Diagnosis not present

## 2019-05-27 DIAGNOSIS — N186 End stage renal disease: Secondary | ICD-10-CM | POA: Diagnosis not present

## 2019-05-27 DIAGNOSIS — C9000 Multiple myeloma not having achieved remission: Secondary | ICD-10-CM | POA: Diagnosis not present

## 2019-05-27 DIAGNOSIS — Z992 Dependence on renal dialysis: Secondary | ICD-10-CM | POA: Diagnosis not present

## 2019-05-27 DIAGNOSIS — R569 Unspecified convulsions: Secondary | ICD-10-CM | POA: Diagnosis not present

## 2019-05-27 DIAGNOSIS — I639 Cerebral infarction, unspecified: Secondary | ICD-10-CM | POA: Diagnosis not present

## 2019-05-27 DIAGNOSIS — I482 Chronic atrial fibrillation, unspecified: Secondary | ICD-10-CM | POA: Diagnosis not present

## 2019-05-27 DIAGNOSIS — K2971 Gastritis, unspecified, with bleeding: Secondary | ICD-10-CM | POA: Diagnosis not present

## 2019-05-27 MED ORDER — VIREAD 150 MG PO TABS
ORAL_TABLET | ORAL | Status: DC
Start: 2019-06-07 — End: 2019-05-27

## 2019-05-27 MED ORDER — PREPARATION H TOTABLES 1-0.25-14.4-15 % RE CREA
2.00 | TOPICAL_CREAM | RECTAL | Status: DC
Start: 2019-06-07 — End: 2019-05-27

## 2019-05-27 MED ORDER — Medication
150.00 | Status: DC
Start: 2019-05-27 — End: 2019-05-27

## 2019-05-27 MED ORDER — CINACALCET HCL 30 MG PO TABS
60.00 | ORAL_TABLET | ORAL | Status: DC
Start: 2019-05-29 — End: 2019-05-27

## 2019-05-27 MED ORDER — GENERIC EXTERNAL MEDICATION
1.00 | Status: DC
Start: 2019-05-27 — End: 2019-05-27

## 2019-05-27 MED ORDER — Medication
1334.00 | Status: DC
Start: 2019-05-27 — End: 2019-05-27

## 2019-05-27 MED ORDER — CALCIUM CARBONATE ANTACID PO
0.50 | ORAL | Status: DC
Start: 2019-05-27 — End: 2019-05-27

## 2019-05-27 MED ORDER — BENICAR 20 MG PO TABS
12.50 | ORAL_TABLET | ORAL | Status: DC
Start: ? — End: 2019-05-27

## 2019-05-27 MED ORDER — GOODSENSE MUCUS RELIEF 400 MG PO TABS
200.00 | ORAL_TABLET | ORAL | Status: DC
Start: 2019-05-27 — End: 2019-05-27

## 2019-05-27 MED ORDER — METOPROLOL SUCCINATE ER 50 MG PO TB24
12.50 | ORAL_TABLET | ORAL | Status: DC
Start: 2019-05-27 — End: 2019-05-27

## 2019-05-27 MED ORDER — INSULIN LISPRO 100 UNIT/ML ~~LOC~~ SOLN
0.00 | SUBCUTANEOUS | Status: DC
Start: 2019-05-27 — End: 2019-05-27

## 2019-05-27 MED ORDER — PANTOPRAZOLE SODIUM 40 MG PO TBEC
40.00 | DELAYED_RELEASE_TABLET | ORAL | Status: DC
Start: 2019-05-28 — End: 2019-05-27

## 2019-05-27 MED ORDER — GLUCAGON HCL RDNA (DIAGNOSTIC) 1 MG IJ SOLR
1.00 | INTRAMUSCULAR | Status: DC
Start: ? — End: 2019-05-27

## 2019-05-27 MED ORDER — LAMOTRIGINE 150 MG PO TABS
150.00 | ORAL_TABLET | ORAL | Status: DC
Start: 2019-05-27 — End: 2019-05-27

## 2019-05-28 DIAGNOSIS — I6203 Nontraumatic chronic subdural hemorrhage: Secondary | ICD-10-CM | POA: Diagnosis not present

## 2019-05-28 DIAGNOSIS — I272 Pulmonary hypertension, unspecified: Secondary | ICD-10-CM | POA: Diagnosis not present

## 2019-05-28 DIAGNOSIS — A498 Other bacterial infections of unspecified site: Secondary | ICD-10-CM

## 2019-05-28 DIAGNOSIS — N186 End stage renal disease: Secondary | ICD-10-CM | POA: Diagnosis not present

## 2019-05-28 DIAGNOSIS — E213 Hyperparathyroidism, unspecified: Secondary | ICD-10-CM | POA: Diagnosis not present

## 2019-05-28 DIAGNOSIS — R569 Unspecified convulsions: Secondary | ICD-10-CM | POA: Diagnosis not present

## 2019-05-28 DIAGNOSIS — I10 Essential (primary) hypertension: Secondary | ICD-10-CM | POA: Diagnosis not present

## 2019-05-28 DIAGNOSIS — I6201 Nontraumatic acute subdural hemorrhage: Secondary | ICD-10-CM | POA: Diagnosis not present

## 2019-05-28 DIAGNOSIS — R1312 Dysphagia, oropharyngeal phase: Secondary | ICD-10-CM | POA: Diagnosis not present

## 2019-05-28 DIAGNOSIS — E872 Acidosis: Secondary | ICD-10-CM | POA: Diagnosis not present

## 2019-05-28 DIAGNOSIS — S065X9A Traumatic subdural hemorrhage with loss of consciousness of unspecified duration, initial encounter: Secondary | ICD-10-CM | POA: Diagnosis not present

## 2019-05-28 DIAGNOSIS — Z992 Dependence on renal dialysis: Secondary | ICD-10-CM | POA: Diagnosis not present

## 2019-05-28 DIAGNOSIS — C9000 Multiple myeloma not having achieved remission: Secondary | ICD-10-CM | POA: Diagnosis not present

## 2019-05-28 DIAGNOSIS — I482 Chronic atrial fibrillation, unspecified: Secondary | ICD-10-CM | POA: Diagnosis not present

## 2019-05-28 DIAGNOSIS — I6200 Nontraumatic subdural hemorrhage, unspecified: Secondary | ICD-10-CM | POA: Diagnosis not present

## 2019-05-28 DIAGNOSIS — J189 Pneumonia, unspecified organism: Secondary | ICD-10-CM | POA: Diagnosis not present

## 2019-05-28 DIAGNOSIS — G40909 Epilepsy, unspecified, not intractable, without status epilepticus: Secondary | ICD-10-CM | POA: Diagnosis not present

## 2019-05-28 DIAGNOSIS — I63019 Cerebral infarction due to thrombosis of unspecified vertebral artery: Secondary | ICD-10-CM | POA: Diagnosis not present

## 2019-05-28 HISTORY — DX: Other bacterial infections of unspecified site: A49.8

## 2019-05-29 DIAGNOSIS — Z992 Dependence on renal dialysis: Secondary | ICD-10-CM | POA: Diagnosis not present

## 2019-05-29 DIAGNOSIS — E213 Hyperparathyroidism, unspecified: Secondary | ICD-10-CM | POA: Diagnosis not present

## 2019-05-29 DIAGNOSIS — J189 Pneumonia, unspecified organism: Secondary | ICD-10-CM | POA: Diagnosis not present

## 2019-05-29 DIAGNOSIS — R569 Unspecified convulsions: Secondary | ICD-10-CM | POA: Diagnosis not present

## 2019-05-29 DIAGNOSIS — R1312 Dysphagia, oropharyngeal phase: Secondary | ICD-10-CM | POA: Diagnosis not present

## 2019-05-29 DIAGNOSIS — N186 End stage renal disease: Secondary | ICD-10-CM | POA: Diagnosis not present

## 2019-05-29 DIAGNOSIS — I6200 Nontraumatic subdural hemorrhage, unspecified: Secondary | ICD-10-CM | POA: Diagnosis not present

## 2019-05-29 DIAGNOSIS — E872 Acidosis: Secondary | ICD-10-CM | POA: Diagnosis not present

## 2019-05-29 DIAGNOSIS — I10 Essential (primary) hypertension: Secondary | ICD-10-CM | POA: Diagnosis not present

## 2019-05-30 ENCOUNTER — Other Ambulatory Visit: Payer: Self-pay | Admitting: Neurology

## 2019-05-30 DIAGNOSIS — N186 End stage renal disease: Secondary | ICD-10-CM | POA: Diagnosis not present

## 2019-05-30 DIAGNOSIS — I63019 Cerebral infarction due to thrombosis of unspecified vertebral artery: Secondary | ICD-10-CM | POA: Diagnosis not present

## 2019-05-30 DIAGNOSIS — Z992 Dependence on renal dialysis: Secondary | ICD-10-CM | POA: Diagnosis not present

## 2019-05-30 DIAGNOSIS — A4150 Gram-negative sepsis, unspecified: Secondary | ICD-10-CM | POA: Diagnosis not present

## 2019-05-30 DIAGNOSIS — A4159 Other Gram-negative sepsis: Secondary | ICD-10-CM | POA: Diagnosis not present

## 2019-05-30 DIAGNOSIS — R1312 Dysphagia, oropharyngeal phase: Secondary | ICD-10-CM | POA: Diagnosis not present

## 2019-05-30 DIAGNOSIS — I482 Chronic atrial fibrillation, unspecified: Secondary | ICD-10-CM | POA: Diagnosis not present

## 2019-05-31 ENCOUNTER — Telehealth: Payer: Self-pay | Admitting: Neurology

## 2019-05-31 DIAGNOSIS — N200 Calculus of kidney: Secondary | ICD-10-CM

## 2019-05-31 DIAGNOSIS — I482 Chronic atrial fibrillation, unspecified: Secondary | ICD-10-CM | POA: Diagnosis not present

## 2019-05-31 DIAGNOSIS — Z992 Dependence on renal dialysis: Secondary | ICD-10-CM | POA: Diagnosis not present

## 2019-05-31 DIAGNOSIS — A4150 Gram-negative sepsis, unspecified: Secondary | ICD-10-CM | POA: Diagnosis not present

## 2019-05-31 DIAGNOSIS — G40909 Epilepsy, unspecified, not intractable, without status epilepticus: Secondary | ICD-10-CM | POA: Diagnosis not present

## 2019-05-31 DIAGNOSIS — I63019 Cerebral infarction due to thrombosis of unspecified vertebral artery: Secondary | ICD-10-CM | POA: Diagnosis not present

## 2019-05-31 DIAGNOSIS — C9000 Multiple myeloma not having achieved remission: Secondary | ICD-10-CM | POA: Diagnosis not present

## 2019-05-31 DIAGNOSIS — R4182 Altered mental status, unspecified: Secondary | ICD-10-CM | POA: Diagnosis not present

## 2019-05-31 DIAGNOSIS — N186 End stage renal disease: Secondary | ICD-10-CM | POA: Diagnosis not present

## 2019-05-31 DIAGNOSIS — R1312 Dysphagia, oropharyngeal phase: Secondary | ICD-10-CM | POA: Diagnosis not present

## 2019-05-31 DIAGNOSIS — A4159 Other Gram-negative sepsis: Secondary | ICD-10-CM | POA: Diagnosis not present

## 2019-05-31 HISTORY — DX: Calculus of kidney: N20.0

## 2019-05-31 MED FILL — SUBVENITE 150 MG TABS: 150 | 90 days supply | Qty: 180 | Fill #0

## 2019-05-31 NOTE — Telephone Encounter (Signed)
Wife is calling in about update on patient - nothing in particular she is just wanting to update about patient's condition. Thanks!

## 2019-06-01 ENCOUNTER — Other Ambulatory Visit: Payer: Self-pay

## 2019-06-01 DIAGNOSIS — N186 End stage renal disease: Secondary | ICD-10-CM | POA: Diagnosis not present

## 2019-06-01 DIAGNOSIS — I63019 Cerebral infarction due to thrombosis of unspecified vertebral artery: Secondary | ICD-10-CM | POA: Diagnosis not present

## 2019-06-01 DIAGNOSIS — Z992 Dependence on renal dialysis: Secondary | ICD-10-CM | POA: Diagnosis not present

## 2019-06-01 DIAGNOSIS — I482 Chronic atrial fibrillation, unspecified: Secondary | ICD-10-CM | POA: Diagnosis not present

## 2019-06-01 DIAGNOSIS — A4159 Other Gram-negative sepsis: Secondary | ICD-10-CM | POA: Diagnosis not present

## 2019-06-01 DIAGNOSIS — R1312 Dysphagia, oropharyngeal phase: Secondary | ICD-10-CM | POA: Diagnosis not present

## 2019-06-01 DIAGNOSIS — A4150 Gram-negative sepsis, unspecified: Secondary | ICD-10-CM | POA: Diagnosis not present

## 2019-06-01 NOTE — Telephone Encounter (Signed)
Called pt wife just want to let Dr. Delice Lesch pt update-  admitted to [name] cell/bone morrow transplant,  Pt had seizure last Monday/friday and not sleeping for 3 days  find out pt had subdural hematoma/bacteria in the lungs.  Released Neuro ICU-Thursday but still in [place]  Please review info from [place].

## 2019-06-02 ENCOUNTER — Telehealth: Payer: Self-pay | Admitting: Neurology

## 2019-06-02 MED ORDER — GENERIC EXTERNAL MEDICATION
1.00 | Status: DC
Start: ? — End: 2019-06-02

## 2019-06-02 MED ORDER — AMLODIPINE BESYLATE 5 MG PO TABS
5.00 | ORAL_TABLET | ORAL | Status: DC
Start: 2019-06-08 — End: 2019-06-02

## 2019-06-02 MED ORDER — INSULIN LISPRO 100 UNIT/ML ~~LOC~~ SOLN
0.00 | SUBCUTANEOUS | Status: DC
Start: 2019-06-07 — End: 2019-06-02

## 2019-06-02 MED ORDER — Medication
1.00 | Status: DC
Start: 2019-06-07 — End: 2019-06-02

## 2019-06-02 MED ORDER — CLONAZEPAM 0.5 MG PO TABS
0.50 | ORAL_TABLET | ORAL | Status: DC
Start: ? — End: 2019-06-02

## 2019-06-02 MED ORDER — GENERIC EXTERNAL MEDICATION
40.00 | Status: DC
Start: 2019-06-03 — End: 2019-06-02

## 2019-06-02 MED ORDER — GENERIC EXTERNAL MEDICATION
150.00 | Status: DC
Start: 2019-06-02 — End: 2019-06-02

## 2019-06-02 MED ORDER — CALCIUM ACETATE (PHOS BINDER) 667 MG PO CAPS
1334.00 | ORAL_CAPSULE | ORAL | Status: DC
Start: 2019-06-07 — End: 2019-06-02

## 2019-06-02 MED ORDER — CIPROFLOXACIN HCL 500 MG PO TABS
500.00 | ORAL_TABLET | ORAL | Status: DC
Start: 2019-06-07 — End: 2019-06-02

## 2019-06-02 MED ORDER — HYDRALAZINE HCL 20 MG/ML IJ SOLN
10.00 | INTRAMUSCULAR | Status: DC
Start: ? — End: 2019-06-02

## 2019-06-02 MED ORDER — CHLOROPHYLL EX
10.00 | CUTANEOUS | Status: DC
Start: ? — End: 2019-06-02

## 2019-06-02 MED ORDER — EPOETIN ALFA-EPBX 10000 UNIT/ML IJ SOLN
10000.00 | INTRAMUSCULAR | Status: DC
Start: ? — End: 2019-06-02

## 2019-06-02 MED ORDER — Medication
750.00 | Status: DC
Start: 2019-06-02 — End: 2019-06-02

## 2019-06-02 MED ORDER — GOODSENSE MUCUS RELIEF 400 MG PO TABS
200.00 | ORAL_TABLET | ORAL | Status: DC
Start: 2019-06-07 — End: 2019-06-02

## 2019-06-02 MED ORDER — VICON FORTE PO CAPS
17.00 | ORAL_CAPSULE | ORAL | Status: DC
Start: ? — End: 2019-06-02

## 2019-06-02 MED ORDER — Medication
12.50 | Status: DC
Start: 2019-06-07 — End: 2019-06-02

## 2019-06-02 MED ORDER — CALCIUM CARBONATE ANTACID PO
0.50 | ORAL | Status: DC
Start: ? — End: 2019-06-02

## 2019-06-02 MED ORDER — GENERIC EXTERNAL MEDICATION
150.00 | Status: DC
Start: ? — End: 2019-06-02

## 2019-06-02 MED ORDER — Medication
Status: DC
Start: ? — End: 2019-06-02

## 2019-06-02 MED ORDER — HISTUSS PD PO
60.00 | ORAL | Status: DC
Start: 2019-06-08 — End: 2019-06-02

## 2019-06-02 NOTE — Telephone Encounter (Signed)
Patient's spouse [name] called regarding [place] being released from [facility] on 06/07/19. She said she is wanting an appointment for him to follow up with Dr. Delice Lesch. He has Dialysis on Mon/Wed/Fri. The other 2 days Dr. Delice Lesch is doing Virtual visits. Please Advise. Thanks

## 2019-06-02 NOTE — Telephone Encounter (Signed)
Pt has an appt in Dec. Do you want to see him sooner?

## 2019-06-02 NOTE — Telephone Encounter (Signed)
noted 

## 2019-06-03 DIAGNOSIS — Z992 Dependence on renal dialysis: Secondary | ICD-10-CM | POA: Diagnosis not present

## 2019-06-03 DIAGNOSIS — A4159 Other Gram-negative sepsis: Secondary | ICD-10-CM | POA: Diagnosis not present

## 2019-06-03 DIAGNOSIS — N186 End stage renal disease: Secondary | ICD-10-CM | POA: Diagnosis not present

## 2019-06-03 DIAGNOSIS — A4150 Gram-negative sepsis, unspecified: Secondary | ICD-10-CM | POA: Diagnosis not present

## 2019-06-03 DIAGNOSIS — R1312 Dysphagia, oropharyngeal phase: Secondary | ICD-10-CM | POA: Diagnosis not present

## 2019-06-03 DIAGNOSIS — I63019 Cerebral infarction due to thrombosis of unspecified vertebral artery: Secondary | ICD-10-CM | POA: Diagnosis not present

## 2019-06-03 DIAGNOSIS — I482 Chronic atrial fibrillation, unspecified: Secondary | ICD-10-CM | POA: Diagnosis not present

## 2019-06-03 NOTE — Telephone Encounter (Signed)
What time is his dialysis? I have an 11:30am on 9/30 Wed, we can do either in-person or virtual, if easier for them. Otherwise my next available for 10/22 at 11:30am virtual. Thanks

## 2019-06-03 NOTE — Telephone Encounter (Signed)
Pt starts dialysis at 10:15. Pt is unable to be here at 11:30. Pt is also unable to have a virtual visit during dialysis because he would need too much help and it is so loud during the treatment.  Informed wife that 10/22 would be a virtual visit but not on a dialysis day ( sounded like she was driving) She did agree to that appt but would really like to come in the office if possible.

## 2019-06-04 DIAGNOSIS — N186 End stage renal disease: Secondary | ICD-10-CM | POA: Diagnosis not present

## 2019-06-04 DIAGNOSIS — C9000 Multiple myeloma not having achieved remission: Secondary | ICD-10-CM | POA: Diagnosis not present

## 2019-06-04 DIAGNOSIS — R1312 Dysphagia, oropharyngeal phase: Secondary | ICD-10-CM | POA: Diagnosis not present

## 2019-06-04 DIAGNOSIS — Q211 Atrial septal defect: Secondary | ICD-10-CM | POA: Diagnosis not present

## 2019-06-04 DIAGNOSIS — Z992 Dependence on renal dialysis: Secondary | ICD-10-CM | POA: Diagnosis not present

## 2019-06-05 DIAGNOSIS — C9000 Multiple myeloma not having achieved remission: Secondary | ICD-10-CM | POA: Diagnosis not present

## 2019-06-05 DIAGNOSIS — Q211 Atrial septal defect: Secondary | ICD-10-CM | POA: Diagnosis not present

## 2019-06-05 DIAGNOSIS — R1312 Dysphagia, oropharyngeal phase: Secondary | ICD-10-CM | POA: Diagnosis not present

## 2019-06-05 DIAGNOSIS — Z992 Dependence on renal dialysis: Secondary | ICD-10-CM | POA: Diagnosis not present

## 2019-06-05 DIAGNOSIS — N186 End stage renal disease: Secondary | ICD-10-CM | POA: Diagnosis not present

## 2019-06-06 DIAGNOSIS — Z992 Dependence on renal dialysis: Secondary | ICD-10-CM | POA: Diagnosis not present

## 2019-06-06 DIAGNOSIS — I482 Chronic atrial fibrillation, unspecified: Secondary | ICD-10-CM | POA: Diagnosis not present

## 2019-06-06 DIAGNOSIS — A4159 Other Gram-negative sepsis: Secondary | ICD-10-CM | POA: Diagnosis not present

## 2019-06-06 DIAGNOSIS — N186 End stage renal disease: Secondary | ICD-10-CM | POA: Diagnosis not present

## 2019-06-06 DIAGNOSIS — A4150 Gram-negative sepsis, unspecified: Secondary | ICD-10-CM | POA: Diagnosis not present

## 2019-06-06 DIAGNOSIS — R1312 Dysphagia, oropharyngeal phase: Secondary | ICD-10-CM | POA: Diagnosis not present

## 2019-06-06 DIAGNOSIS — I63019 Cerebral infarction due to thrombosis of unspecified vertebral artery: Secondary | ICD-10-CM | POA: Diagnosis not present

## 2019-06-06 MED FILL — clonazePAM 0.5 MG TABS: 0.5 | 90 days supply | Qty: 45 | Fill #1

## 2019-06-06 MED FILL — CIPROFLOXACIN HCL 500 MG TA: 500 | 30 days supply | Qty: 30 | Fill #0

## 2019-06-06 MED FILL — AMLODIPINE BESYLATE 5 MG TA: 5 | 30 days supply | Qty: 30 | Fill #0

## 2019-06-07 ENCOUNTER — Telehealth: Payer: Self-pay | Admitting: Neurology

## 2019-06-07 ENCOUNTER — Other Ambulatory Visit: Payer: Self-pay

## 2019-06-07 DIAGNOSIS — R1312 Dysphagia, oropharyngeal phase: Secondary | ICD-10-CM | POA: Diagnosis not present

## 2019-06-07 DIAGNOSIS — Z8673 Personal history of transient ischemic attack (TIA), and cerebral infarction without residual deficits: Secondary | ICD-10-CM

## 2019-06-07 DIAGNOSIS — I63019 Cerebral infarction due to thrombosis of unspecified vertebral artery: Secondary | ICD-10-CM | POA: Diagnosis not present

## 2019-06-07 DIAGNOSIS — I10 Essential (primary) hypertension: Secondary | ICD-10-CM | POA: Diagnosis not present

## 2019-06-07 DIAGNOSIS — S065X9A Traumatic subdural hemorrhage with loss of consciousness of unspecified duration, initial encounter: Secondary | ICD-10-CM | POA: Diagnosis not present

## 2019-06-07 DIAGNOSIS — E213 Hyperparathyroidism, unspecified: Secondary | ICD-10-CM | POA: Diagnosis not present

## 2019-06-07 DIAGNOSIS — Z992 Dependence on renal dialysis: Secondary | ICD-10-CM | POA: Diagnosis not present

## 2019-06-07 DIAGNOSIS — A4159 Other Gram-negative sepsis: Secondary | ICD-10-CM | POA: Diagnosis not present

## 2019-06-07 DIAGNOSIS — I482 Chronic atrial fibrillation, unspecified: Secondary | ICD-10-CM | POA: Diagnosis not present

## 2019-06-07 DIAGNOSIS — A4150 Gram-negative sepsis, unspecified: Secondary | ICD-10-CM | POA: Diagnosis not present

## 2019-06-07 DIAGNOSIS — G40209 Localization-related (focal) (partial) symptomatic epilepsy and epileptic syndromes with complex partial seizures, not intractable, without status epilepticus: Secondary | ICD-10-CM

## 2019-06-07 DIAGNOSIS — C9000 Multiple myeloma not having achieved remission: Secondary | ICD-10-CM | POA: Diagnosis not present

## 2019-06-07 DIAGNOSIS — N186 End stage renal disease: Secondary | ICD-10-CM | POA: Diagnosis not present

## 2019-06-07 NOTE — Telephone Encounter (Signed)
Done

## 2019-06-07 NOTE — Telephone Encounter (Signed)
Pls let wife know I have a 3:30pm on Friday Oct 9 that has opened up for in-person visit, if she wishes to move their appt up. Thanks

## 2019-06-07 NOTE — Telephone Encounter (Signed)
Spoke to Joyice Faster, NP [phone]) re: course at [facility]. He went for HD and became hypotensive, was alert and oriented back on the floors then had a seizure. Found to have right-sided SDH, no surgery done, serial CTs were stable. He was in the ICU and found to have Proteus bacteremia. He was back on the Heme/Onc fteam on 9/20 pretty delirious and agitated, not taking meds, briefly switched from LTG to VPA IV. For a week now close to baseline per wife, taking PO meds back on LTG and LCS with clonazepam prior to HD. No further episodes. Continuing to hold on transplant, being discharged today. Also following up with Nephro for kidney stones. Neurosurgery has recommended a f/u CT head on 10/9, but wife would like this done in [place].  Caryl Pina, pls schedule head CT without contrast for 10/9 (or thereabouts), for follow-up of subdural hematoma. Thanks!

## 2019-06-07 NOTE — Telephone Encounter (Signed)
CT Head w/o contrast ordered stat so hopefully it will be scheduled around 06/18/19.

## 2019-06-08 ENCOUNTER — Telehealth: Payer: Self-pay

## 2019-06-08 NOTE — Telephone Encounter (Signed)
Pt wife called to inform provider that he was at [facility] for a stem cell procedure but he couldn't get it due to infection in his blood. Pt currently on cipro. Pt has follow up for labs on oct 8 and sees the seizure dr on oct 9.

## 2019-06-09 ENCOUNTER — Inpatient Hospital Stay (HOSPITAL_COMMUNITY)
Admission: EM | Admit: 2019-06-09 | Discharge: 2019-06-14 | DRG: 064 | Disposition: A | Discharge status: Rehabilitation Facility | Payer: Medicare Other | Attending: Internal Medicine | Admitting: Internal Medicine

## 2019-06-09 ENCOUNTER — Emergency Department (HOSPITAL_COMMUNITY): Payer: Medicare Other

## 2019-06-09 ENCOUNTER — Inpatient Hospital Stay (HOSPITAL_COMMUNITY): Payer: Medicare Other

## 2019-06-09 ENCOUNTER — Encounter: Payer: Self-pay | Admitting: Internal Medicine

## 2019-06-09 ENCOUNTER — Encounter (HOSPITAL_COMMUNITY): Payer: Self-pay | Admitting: Neurology

## 2019-06-09 DIAGNOSIS — Z6826 Body mass index (BMI) 26.0-26.9, adult: Secondary | ICD-10-CM

## 2019-06-09 DIAGNOSIS — Z794 Long term (current) use of insulin: Secondary | ICD-10-CM | POA: Diagnosis not present

## 2019-06-09 DIAGNOSIS — R7881 Bacteremia: Secondary | ICD-10-CM | POA: Diagnosis not present

## 2019-06-09 DIAGNOSIS — G4733 Obstructive sleep apnea (adult) (pediatric): Secondary | ICD-10-CM | POA: Diagnosis present

## 2019-06-09 DIAGNOSIS — I6201 Nontraumatic acute subdural hemorrhage: Secondary | ICD-10-CM

## 2019-06-09 DIAGNOSIS — R29810 Facial weakness: Secondary | ICD-10-CM | POA: Diagnosis not present

## 2019-06-09 DIAGNOSIS — I6203 Nontraumatic chronic subdural hemorrhage: Secondary | ICD-10-CM | POA: Diagnosis not present

## 2019-06-09 DIAGNOSIS — E669 Obesity, unspecified: Secondary | ICD-10-CM | POA: Diagnosis present

## 2019-06-09 DIAGNOSIS — B964 Proteus (mirabilis) (morganii) as the cause of diseases classified elsewhere: Secondary | ICD-10-CM | POA: Diagnosis present

## 2019-06-09 DIAGNOSIS — Z823 Family history of stroke: Secondary | ICD-10-CM

## 2019-06-09 DIAGNOSIS — Z7982 Long term (current) use of aspirin: Secondary | ICD-10-CM | POA: Diagnosis not present

## 2019-06-09 DIAGNOSIS — G40909 Epilepsy, unspecified, not intractable, without status epilepticus: Secondary | ICD-10-CM | POA: Diagnosis not present

## 2019-06-09 DIAGNOSIS — R4781 Slurred speech: Secondary | ICD-10-CM | POA: Diagnosis not present

## 2019-06-09 DIAGNOSIS — R627 Adult failure to thrive: Secondary | ICD-10-CM | POA: Diagnosis present

## 2019-06-09 DIAGNOSIS — J690 Pneumonitis due to inhalation of food and vomit: Secondary | ICD-10-CM

## 2019-06-09 DIAGNOSIS — I10 Essential (primary) hypertension: Secondary | ICD-10-CM | POA: Diagnosis present

## 2019-06-09 DIAGNOSIS — E119 Type 2 diabetes mellitus without complications: Secondary | ICD-10-CM

## 2019-06-09 DIAGNOSIS — R471 Dysarthria and anarthria: Secondary | ICD-10-CM | POA: Diagnosis present

## 2019-06-09 DIAGNOSIS — R0989 Other specified symptoms and signs involving the circulatory and respiratory systems: Secondary | ICD-10-CM | POA: Diagnosis not present

## 2019-06-09 DIAGNOSIS — Q211 Atrial septal defect: Secondary | ICD-10-CM | POA: Diagnosis not present

## 2019-06-09 DIAGNOSIS — R4182 Altered mental status, unspecified: Secondary | ICD-10-CM | POA: Diagnosis not present

## 2019-06-09 DIAGNOSIS — I480 Paroxysmal atrial fibrillation: Secondary | ICD-10-CM | POA: Diagnosis present

## 2019-06-09 DIAGNOSIS — I482 Chronic atrial fibrillation, unspecified: Secondary | ICD-10-CM | POA: Diagnosis present

## 2019-06-09 DIAGNOSIS — D5740 Sickle-cell thalassemia without crisis: Secondary | ICD-10-CM | POA: Diagnosis present

## 2019-06-09 DIAGNOSIS — R531 Weakness: Secondary | ICD-10-CM

## 2019-06-09 DIAGNOSIS — Z20828 Contact with and (suspected) exposure to other viral communicable diseases: Secondary | ICD-10-CM | POA: Diagnosis not present

## 2019-06-09 DIAGNOSIS — Z79899 Other long term (current) drug therapy: Secondary | ICD-10-CM

## 2019-06-09 DIAGNOSIS — I120 Hypertensive chronic kidney disease with stage 5 chronic kidney disease or end stage renal disease: Secondary | ICD-10-CM | POA: Diagnosis not present

## 2019-06-09 DIAGNOSIS — D631 Anemia in chronic kidney disease: Secondary | ICD-10-CM | POA: Diagnosis not present

## 2019-06-09 DIAGNOSIS — G8194 Hemiplegia, unspecified affecting left nondominant side: Secondary | ICD-10-CM | POA: Diagnosis present

## 2019-06-09 DIAGNOSIS — I6200 Nontraumatic subdural hemorrhage, unspecified: Secondary | ICD-10-CM | POA: Diagnosis not present

## 2019-06-09 DIAGNOSIS — Z86711 Personal history of pulmonary embolism: Secondary | ICD-10-CM | POA: Diagnosis not present

## 2019-06-09 DIAGNOSIS — C9000 Multiple myeloma not having achieved remission: Secondary | ICD-10-CM | POA: Diagnosis present

## 2019-06-09 DIAGNOSIS — R7309 Other abnormal glucose: Secondary | ICD-10-CM | POA: Diagnosis not present

## 2019-06-09 DIAGNOSIS — N186 End stage renal disease: Secondary | ICD-10-CM | POA: Diagnosis not present

## 2019-06-09 DIAGNOSIS — Z885 Allergy status to narcotic agent status: Secondary | ICD-10-CM | POA: Diagnosis not present

## 2019-06-09 DIAGNOSIS — D638 Anemia in other chronic diseases classified elsewhere: Secondary | ICD-10-CM | POA: Diagnosis not present

## 2019-06-09 DIAGNOSIS — E1122 Type 2 diabetes mellitus with diabetic chronic kidney disease: Secondary | ICD-10-CM | POA: Diagnosis present

## 2019-06-09 DIAGNOSIS — Z8249 Family history of ischemic heart disease and other diseases of the circulatory system: Secondary | ICD-10-CM

## 2019-06-09 DIAGNOSIS — Z7951 Long term (current) use of inhaled steroids: Secondary | ICD-10-CM | POA: Diagnosis not present

## 2019-06-09 DIAGNOSIS — N200 Calculus of kidney: Secondary | ICD-10-CM | POA: Diagnosis present

## 2019-06-09 DIAGNOSIS — E1065 Type 1 diabetes mellitus with hyperglycemia: Secondary | ICD-10-CM | POA: Diagnosis not present

## 2019-06-09 DIAGNOSIS — Z95828 Presence of other vascular implants and grafts: Secondary | ICD-10-CM | POA: Diagnosis not present

## 2019-06-09 DIAGNOSIS — Z992 Dependence on renal dialysis: Secondary | ICD-10-CM | POA: Diagnosis not present

## 2019-06-09 DIAGNOSIS — S065XAA Traumatic subdural hemorrhage with loss of consciousness status unknown, initial encounter: Secondary | ICD-10-CM | POA: Diagnosis present

## 2019-06-09 DIAGNOSIS — S065X9A Traumatic subdural hemorrhage with loss of consciousness of unspecified duration, initial encounter: Secondary | ICD-10-CM | POA: Diagnosis not present

## 2019-06-09 DIAGNOSIS — I69122 Dysarthria following nontraumatic intracerebral hemorrhage: Secondary | ICD-10-CM | POA: Diagnosis not present

## 2019-06-09 DIAGNOSIS — M109 Gout, unspecified: Secondary | ICD-10-CM | POA: Diagnosis present

## 2019-06-09 DIAGNOSIS — I69151 Hemiplegia and hemiparesis following nontraumatic intracerebral hemorrhage affecting right dominant side: Secondary | ICD-10-CM | POA: Diagnosis not present

## 2019-06-09 DIAGNOSIS — E1165 Type 2 diabetes mellitus with hyperglycemia: Secondary | ICD-10-CM | POA: Diagnosis not present

## 2019-06-09 DIAGNOSIS — N2581 Secondary hyperparathyroidism of renal origin: Secondary | ICD-10-CM | POA: Diagnosis not present

## 2019-06-09 DIAGNOSIS — Z832 Family history of diseases of the blood and blood-forming organs and certain disorders involving the immune mechanism: Secondary | ICD-10-CM | POA: Diagnosis not present

## 2019-06-09 DIAGNOSIS — R569 Unspecified convulsions: Secondary | ICD-10-CM | POA: Diagnosis not present

## 2019-06-09 DIAGNOSIS — R29818 Other symptoms and signs involving the nervous system: Secondary | ICD-10-CM | POA: Diagnosis not present

## 2019-06-09 DIAGNOSIS — Z87898 Personal history of other specified conditions: Secondary | ICD-10-CM

## 2019-06-09 HISTORY — DX: Sepsis, unspecified organism: A41.9

## 2019-06-09 HISTORY — DX: Multiple myeloma not having achieved remission: C90.00

## 2019-06-09 HISTORY — DX: Type 2 diabetes mellitus without complications: E11.9

## 2019-06-09 HISTORY — DX: Pneumonitis due to inhalation of food and vomit: J69.0

## 2019-06-09 HISTORY — DX: Nontraumatic chronic subdural hemorrhage: I62.03

## 2019-06-09 HISTORY — DX: Essential (primary) hypertension: I10

## 2019-06-09 HISTORY — DX: Epilepsy, unspecified, not intractable, without status epilepticus: G40.909

## 2019-06-09 HISTORY — DX: Gastrointestinal hemorrhage, unspecified: K92.2

## 2019-06-09 LAB — DIFFERENTIAL
Abs Immature Granulocytes: 0.03 10*3/uL (ref 0.00–0.07)
Basophils Absolute: 0.2 10*3/uL — ABNORMAL HIGH (ref 0.0–0.1)
Basophils Relative: 2 %
Eosinophils Absolute: 0.4 10*3/uL (ref 0.0–0.5)
Eosinophils Relative: 4 %
Immature Granulocytes: 0 %
Lymphocytes Relative: 5 %
Lymphs Abs: 0.4 10*3/uL — ABNORMAL LOW (ref 0.7–4.0)
Monocytes Absolute: 1.5 10*3/uL — ABNORMAL HIGH (ref 0.1–1.0)
Monocytes Relative: 17 %
Neutro Abs: 6.4 10*3/uL (ref 1.7–7.7)
Neutrophils Relative %: 72 %

## 2019-06-09 LAB — CBC
HCT: 26 % — ABNORMAL LOW (ref 39.0–52.0)
Hemoglobin: 9.5 g/dL — ABNORMAL LOW (ref 13.0–17.0)
MCH: 34.4 pg — ABNORMAL HIGH (ref 26.0–34.0)
MCHC: 36.5 g/dL — ABNORMAL HIGH (ref 30.0–36.0)
MCV: 94.2 fL (ref 80.0–100.0)
Platelets: 282 10*3/uL (ref 150–400)
RBC: 2.76 MIL/uL — ABNORMAL LOW (ref 4.22–5.81)
RDW: 16.5 % — ABNORMAL HIGH (ref 11.5–15.5)
WBC: 8.9 10*3/uL (ref 4.0–10.5)
nRBC: 0.6 % — ABNORMAL HIGH (ref 0.0–0.2)

## 2019-06-09 LAB — CBG MONITORING, ED
Glucose-Capillary: 124 mg/dL — ABNORMAL HIGH (ref 70–99)
Glucose-Capillary: 101 mg/dL — ABNORMAL HIGH (ref 70–99)

## 2019-06-09 LAB — APTT: aPTT: 31 seconds (ref 24–36)

## 2019-06-09 LAB — COMPREHENSIVE METABOLIC PANEL
ALT: 9 U/L (ref 0–44)
AST: 18 U/L (ref 15–41)
Albumin: 3.4 g/dL — ABNORMAL LOW (ref 3.5–5.0)
Alkaline Phosphatase: 78 U/L (ref 38–126)
Anion gap: 17 — ABNORMAL HIGH (ref 5–15)
BUN: 32 mg/dL — ABNORMAL HIGH (ref 6–20)
CO2: 21 mmol/L — ABNORMAL LOW (ref 22–32)
Calcium: 8.9 mg/dL (ref 8.9–10.3)
Chloride: 101 mmol/L (ref 98–111)
Creatinine, Ser: 13.08 mg/dL — ABNORMAL HIGH (ref 0.61–1.24)
GFR calc Af Amer: 4 mL/min — ABNORMAL LOW (ref >60)
GFR calc non Af Amer: 4 mL/min — ABNORMAL LOW (ref >60)
Glucose, Bld: 119 mg/dL — ABNORMAL HIGH (ref 70–99)
Potassium: 4.7 mmol/L (ref 3.5–5.1)
Sodium: 139 mmol/L (ref 135–145)
Total Bilirubin: 0.7 mg/dL (ref 0.3–1.2)
Total Protein: 6.3 g/dL — ABNORMAL LOW (ref 6.5–8.1)

## 2019-06-09 LAB — LACTIC ACID, PLASMA: Lactic Acid, Venous: 0.8 mmol/L (ref 0.5–1.9)

## 2019-06-09 LAB — I-STAT CHEM 8, ED
BUN: 33 mg/dL — ABNORMAL HIGH (ref 6–20)
Calcium, Ion: 1.06 mmol/L — ABNORMAL LOW (ref 1.15–1.40)
Chloride: 103 mmol/L (ref 98–111)
Creatinine, Ser: 12.9 mg/dL — ABNORMAL HIGH (ref 0.61–1.24)
Glucose, Bld: 118 mg/dL — ABNORMAL HIGH (ref 70–99)
HCT: 32 % — ABNORMAL LOW (ref 39.0–52.0)
Hemoglobin: 10.9 g/dL — ABNORMAL LOW (ref 13.0–17.0)
Potassium: 4.8 mmol/L (ref 3.5–5.1)
Sodium: 138 mmol/L (ref 135–145)
TCO2: 23 mmol/L (ref 22–32)

## 2019-06-09 LAB — PROTIME-INR
INR: 1.2 (ref 0.8–1.2)
Prothrombin Time: 14.7 seconds (ref 11.4–15.2)

## 2019-06-09 LAB — SARS CORONAVIRUS 2 BY RT PCR (HOSPITAL ORDER, PERFORMED IN ~~LOC~~ HOSPITAL LAB): SARS Coronavirus 2: NEGATIVE

## 2019-06-09 LAB — LIPASE, BLOOD: Lipase: 30 U/L (ref 11–51)

## 2019-06-09 MED ORDER — LACOSAMIDE 150 MG PO TABS: 150.0000 mg | ORAL_TABLET | ORAL | Status: DC

## 2019-06-09 MED ORDER — CINACALCET HCL 30 MG PO TABS
60.0000 mg | ORAL_TABLET | Freq: Every evening | ORAL | Status: DC
  Administered 2019-06-09 – 2019-06-14 (×6): 60 mg via ORAL
  Filled 2019-06-09 (×6): qty 2

## 2019-06-09 MED ORDER — CALCIUM ACETATE (PHOS BINDER) 667 MG PO CAPS
2001.0000 mg | ORAL_CAPSULE | Freq: Three times a day (TID) | ORAL | Status: DC
  Administered 2019-06-09 – 2019-06-14 (×12): 2001 mg via ORAL
  Filled 2019-06-09 (×13): qty 3

## 2019-06-09 MED ORDER — CHLORHEXIDINE GLUCONATE CLOTH 2 % EX PADS
6.0000 | MEDICATED_PAD | Freq: Every day | CUTANEOUS | Status: DC
  Administered 2019-06-10 – 2019-06-14 (×3): 6 via TOPICAL

## 2019-06-09 MED ORDER — CALCIUM CARBONATE ANTACID 1250 MG/5ML PO SUSP
500.0000 mg | Freq: Four times a day (QID) | ORAL | Status: DC | PRN
  Filled 2019-06-09: qty 5

## 2019-06-09 MED ORDER — STROKE: EARLY STAGES OF RECOVERY BOOK
Freq: Once | Status: DC
  Filled 2019-06-09: qty 1

## 2019-06-09 MED ORDER — INSULIN ASPART 100 UNIT/ML ~~LOC~~ SOLN
0.0000 [IU] | Freq: Three times a day (TID) | SUBCUTANEOUS | Status: DC
  Administered 2019-06-11: 2 [IU] via SUBCUTANEOUS

## 2019-06-09 MED ORDER — NEPRO/CARBSTEADY PO LIQD
237.0000 mL | Freq: Three times a day (TID) | ORAL | Status: DC | PRN
  Filled 2019-06-09: qty 237

## 2019-06-09 MED ORDER — RENA-VITE PO TABS
1.0000 | ORAL_TABLET | Freq: Every day | ORAL | Status: DC
  Administered 2019-06-09 – 2019-06-13 (×5): 1 via ORAL
  Filled 2019-06-09 (×5): qty 1

## 2019-06-09 MED ORDER — AMLODIPINE BESYLATE 5 MG PO TABS
5.0000 mg | ORAL_TABLET | Freq: Every day | ORAL | Status: DC
  Administered 2019-06-10 – 2019-06-14 (×5): 5 mg via ORAL
  Filled 2019-06-09 (×5): qty 1

## 2019-06-09 MED ORDER — QC CLEAR STRIPS MISC
150.00 | Status: DC
Start: ? — End: 2019-06-09

## 2019-06-09 MED ORDER — PANTOPRAZOLE SODIUM 40 MG PO TBEC
40.00 | DELAYED_RELEASE_TABLET | ORAL | Status: DC
Start: 2019-06-08 — End: 2019-06-09

## 2019-06-09 MED ORDER — CAMPHOR-MENTHOL 0.5-0.5 % EX LOTN
1.0000 "application " | TOPICAL_LOTION | Freq: Three times a day (TID) | CUTANEOUS | Status: DC | PRN
  Filled 2019-06-09: qty 222

## 2019-06-09 MED ORDER — FLUTICASONE FUROATE-VILANTEROL 200-25 MCG/INH IN AEPB
1.0000 | INHALATION_SPRAY | Freq: Every day | RESPIRATORY_TRACT | Status: DC
  Administered 2019-06-11 – 2019-06-13 (×3): 1 via RESPIRATORY_TRACT
  Filled 2019-06-09: qty 28

## 2019-06-09 MED ORDER — CLONAZEPAM 0.5 MG PO TABS
0.5000 mg | ORAL_TABLET | ORAL | Status: DC
  Administered 2019-06-10: 12:00:00 0.5 mg via ORAL

## 2019-06-09 MED ORDER — LACOSAMIDE 50 MG PO TABS: 150.0000 mg | ORAL_TABLET | ORAL | Status: DC

## 2019-06-09 MED ORDER — ACETAMINOPHEN 160 MG/5ML PO SOLN: 650.0000 mg | ORAL | Status: DC | PRN

## 2019-06-09 MED ORDER — PANTOPRAZOLE SODIUM 40 MG PO TBEC
40.0000 mg | DELAYED_RELEASE_TABLET | Freq: Every day | ORAL | Status: DC
  Administered 2019-06-10 – 2019-06-14 (×5): 40 mg via ORAL
  Filled 2019-06-09 (×5): qty 1

## 2019-06-09 MED ORDER — SODIUM CHLORIDE 0.9% FLUSH
3.0000 mL | Freq: Once | INTRAVENOUS | Status: AC
Start: 2019-06-09 — End: 2019-06-09
  Administered 2019-06-09: 3 mL via INTRAVENOUS

## 2019-06-09 MED ORDER — HYDROXYZINE HCL 25 MG PO TABS: 25.0000 mg | ORAL_TABLET | Freq: Three times a day (TID) | ORAL | Status: DC | PRN

## 2019-06-09 MED ORDER — ACETAMINOPHEN 325 MG PO TABS: 650.0000 mg | ORAL_TABLET | ORAL | Status: DC | PRN

## 2019-06-09 MED ORDER — SENNOSIDES-DOCUSATE SODIUM 8.6-50 MG PO TABS: 1.0000 | ORAL_TABLET | Freq: Every evening | ORAL | Status: DC | PRN

## 2019-06-09 MED ORDER — CALCIUM CARBONATE ANTACID 500 MG PO CHEW
2.0000 | CHEWABLE_TABLET | Freq: Every day | ORAL | Status: DC
  Administered 2019-06-09 – 2019-06-13 (×5): 400 mg via ORAL
  Filled 2019-06-09 (×5): qty 2

## 2019-06-09 MED ORDER — ACETAMINOPHEN 650 MG RE SUPP: 650.0000 mg | RECTAL | Status: DC | PRN

## 2019-06-09 MED ORDER — ZOLPIDEM TARTRATE 5 MG PO TABS
5.0000 mg | ORAL_TABLET | Freq: Every evening | ORAL | Status: DC | PRN
  Administered 2019-06-09: 5 mg via ORAL
  Filled 2019-06-09: qty 1

## 2019-06-09 MED ORDER — LAMOTRIGINE 25 MG PO TABS
150.0000 mg | ORAL_TABLET | Freq: Two times a day (BID) | ORAL | Status: DC
  Administered 2019-06-09: 150 mg via ORAL
  Filled 2019-06-09: qty 1
  Filled 2019-06-09: qty 6

## 2019-06-09 MED ORDER — METOPROLOL TARTRATE 12.5 MG HALF TABLET
12.5000 mg | ORAL_TABLET | Freq: Two times a day (BID) | ORAL | Status: DC
  Administered 2019-06-09 – 2019-06-14 (×10): 12.5 mg via ORAL
  Filled 2019-06-09 (×10): qty 1

## 2019-06-09 MED ORDER — ACYCLOVIR 400 MG PO TABS
400.0000 mg | ORAL_TABLET | Freq: Every day | ORAL | Status: DC
  Administered 2019-06-09 – 2019-06-13 (×5): 400 mg via ORAL
  Filled 2019-06-09 (×7): qty 1

## 2019-06-09 MED ORDER — QC CLEAR STRIPS MISC
150.00 | Status: DC
Start: 2019-06-07 — End: 2019-06-09

## 2019-06-09 MED ORDER — LACOSAMIDE 50 MG PO TABS
150.0000 mg | ORAL_TABLET | ORAL | Status: DC
  Administered 2019-06-09: 150 mg via ORAL

## 2019-06-09 MED ORDER — PROCHLORPERAZINE MALEATE 10 MG PO TABS
10.0000 mg | ORAL_TABLET | Freq: Four times a day (QID) | ORAL | Status: DC | PRN
  Filled 2019-06-09: qty 1

## 2019-06-09 MED ORDER — MS INSULIN SYRINGE 30G X 5/16" 0.5 ML MISC
150.00 | Status: DC
Start: 2019-06-07 — End: 2019-06-09

## 2019-06-09 MED ORDER — SODIUM CHLORIDE 0.9 % IV SOLN
INTRAVENOUS | Status: DC
  Administered 2019-06-09: 19:00:00 via INTRAVENOUS

## 2019-06-09 MED ORDER — SORBITOL 70 % SOLN
30.0000 mL | Status: DC | PRN
  Filled 2019-06-09: qty 30

## 2019-06-09 MED ORDER — CIPROFLOXACIN HCL 500 MG PO TABS
500.0000 mg | ORAL_TABLET | Freq: Every evening | ORAL | Status: DC
  Administered 2019-06-09 – 2019-06-14 (×6): 500 mg via ORAL
  Filled 2019-06-09 (×6): qty 1

## 2019-06-09 MED ORDER — INSULIN ASPART 100 UNIT/ML ~~LOC~~ SOLN: 0.0000 [IU] | Freq: Every day | SUBCUTANEOUS | Status: DC

## 2019-06-09 MED ORDER — DOCUSATE SODIUM 283 MG RE ENEM
1.0000 | ENEMA | RECTAL | Status: DC | PRN
  Filled 2019-06-09: qty 1

## 2019-06-09 NOTE — Code Documentation (Signed)
53yo male arriving to [facility] via [place] at [phone]. Patient from home where he was LKW at 0800. Patient with h/o multiple myeloma, seizures, and renal disease on dialysis with recent SDH and subsequent bacteremia. Patient noted to have right facial droop, right sided weakness and slurred speech this morning and EMS called. EMS activated a code stroke. Stroke team at the bedside on patient arrival. Labs attempted and patient cleared for CT by Dr. Tyrone Nine. Patient to CT with team. CT completed. NIHSS 2, see documentation for details and code stroke times. Patient with right facial droop and RUE ataxia on exam. Patient is contraindicated for treatment with tPA due to recent SDH. No acute stroke treatment at this time. Code stroke canceled. Bedside handoff with ED RN Janett Billow.

## 2019-06-09 NOTE — Progress Notes (Addendum)
Progress Note   [facility] ZOX:[id number] DOB: 1965/12/19 DOA: 06/09/2019  PCP: [name], MD  HPI: [facility] is a 53 y.o. male with medical history significant of multiple myeloma; sepsis; and SDH presenting with worsening symptoms concerning for Code Stroke.   ED Course:  Discharged from [facility] Tuesday - multiple myeloma, Proteus bacteremia, seizures, SDH.  He got better and was discharged Tuesday.  Doing ok until yesterday - worse on the right, trouble with words.  Dr. Leonel Ramsay saw him.  ?change in CT.  Dr. Ellene Route looked at the CT - probably needs overnight evaluation.  Wound consider evacuation.  Consider transfer to [facility].    Past Medical History:  Diagnosis Date  . Multiple myeloma ([place])   . Sepsis ([place])   . Subdural hematoma ([facility])     History reviewed. No pertinent surgical history.  Social History   Socioeconomic History  . Marital status: Married    Spouse name: Not on file  . Number of children: Not on file  . Years of education: Not on file  . Highest education level: Not on file  Occupational History  . Not on file  Social Needs  . Financial resource strain: Not on file  . Food insecurity    Worry: Not on file    Inability: Not on file  . Transportation needs    Medical: Not on file    Non-medical: Not on file  Tobacco Use  . Smoking status: Never Smoker  . Smokeless tobacco: Never Used  Substance and Sexual Activity  . Alcohol use: Not Currently  . Drug use: Never  . Sexual activity: Yes  Lifestyle  . Physical activity    Days per week: Not on file    Minutes per session: Not on file  . Stress: Not on file  Relationships  . Social Herbalist on phone: Not on file    Gets together: Not on file    Attends religious service: Not on file    Active member of club or organization: Not on file    Attends meetings of clubs or organizations: Not on file    Relationship status: Not on file  . Intimate partner violence    Fear of  current or ex partner: Not on file    Emotionally abused: Not on file    Physically abused: Not on file    Forced sexual activity: Not on file  Other Topics Concern  . Not on file  Social History Narrative  . Not on file    Allergies  Allergen Reactions  . Codeine Swelling    Family History  Problem Relation Age of Onset  . Hypertension Mother   . Hypertension Father     Prior to Admission medications   Not on File    Physical Exam: Vitals:   06/09/19 1115 06/09/19 1145 06/09/19 1200 06/09/19 1224  BP: (!) 142/74 (!) 144/78 135/76   Pulse: 77 74 76   Resp: _0 Temp:    98.3 F (36.8 C)  TempSrc:    Oral  SpO2: 98% 97% 98%      Radiological Exams on Admission: Dg Chest Port 1 View  Result Date: 06/09/2019 CLINICAL DATA:  Altered mental status EXAM: PORTABLE CHEST 1 VIEW COMPARISON:  01/06/2016 FINDINGS: The heart size and mediastinal contours are within normal limits. Both lungs are clear. The visualized skeletal structures are unremarkable. IMPRESSION: No acute cardiopulmonary findings. Electronically Signed   By: Hart Carwin  Plundo M.D.   On: 06/09/2019 10:50   Ct Head Code Stroke Wo Contrast  Result Date: 06/09/2019 CLINICAL DATA:  Code stroke. Focal neuro deficit, less than 6 hours, stroke suspected. Additional history provided: Last known well 0 800, right-sided weakness, slurred speech, right facial droop. EXAM: CT HEAD WITHOUT CONTRAST TECHNIQUE: Contiguous axial images were obtained from the base of the skull through the vertex without intravenous contrast. COMPARISON:  Brain MRI 01/07/2016 FINDINGS: Brain: There is a mixed density holohemispheric subdural collection on the right containing internal foci of hyperdense acute/subacute blood products. This measures up to 1.5 cm in greatest thickness. There is mass effect upon the underlying right cerebral hemisphere. 4 mm leftward midline shift. No new cortical infarct is identified. Multiple redemonstrated  chronic infarcts as follows. Moderate-sized chronic infarct within the left frontal lobe. Small chronic infarct within the right frontal lobe. Small chronic infarct within the right temporal occipital lobe. Chronic bilateral cerebellar lacunar infarcts. Vascular: No hyperdense vessel. Atherosclerotic calcification of the carotid artery siphons and vertebrobasilar system. Skull: No calvarial fracture. Sinuses/Orbits: Visualized orbits demonstrate no acute abnormality. Partially imaged mucous retention cyst within the inferior left maxillary sinus. Mild ethmoid sinus mucosal thickening. No significant mastoid effusion. Left suboccipital scalp sebaceous cyst. These results were called by telephone at the time of interpretation on 06/09/2019 at 10:30 am to provider Dr. Leonel Ramsay, who verbally acknowledged these results. ASPECTS [facility] Stroke Program Early CT Score) - Ganglionic level infarction (caudate, lentiform nuclei, internal capsule, insula, M1-M3 cortex): 7 - Supraganglionic infarction (M4-M6 cortex): 3 Total score (0-10 with 10 being normal): 10 (when discounting chronic infarcts) IMPRESSION: 1. Mixed density right holohemispheric subdural collection containing internal foci of acute/subacute blood products, measuring up to 1.5 cm in thickness. Mass effect upon the underlying right cerebral hemisphere. 4 mm leftward midline shift. 2. No new cortical infarct is identified. Redemonstrated chronic infarcts within the left frontal lobe, right frontal lobe, right temporal occipital lobes and bilateral cerebellar hemispheres as described. Electronically Signed   By: Kellie Simmering   On: 06/09/2019 10:42    Based on recent hospitalization at [facility] for this issue and evaluation and treatment done there, I have asked Dr. Tyrone Nine to consider transfer back to [facility] for ongoing care.  He will discuss with the patient/family and proceed accordingly.  If the decision is made to admit at [facility], [employer] will be happy to consult at  that time.  Addendum: family prefers for the patient to stay at [facility] for convenience.    Karmen Bongo MD [facility]   How to contact the [facility] Attending or Consulting provider [place] or covering provider during after hours [place], for this patient?  1. Check the care team in [facility] and look for a) attending/consulting [facility] provider listed and b) the [facility] team listed 2. Log into [email or web address] and use [place] universal password to access. If you do not have the password, please contact the hospital operator. 3. Locate the [facility] provider you are looking for under [facility] and page to a number that you can be directly reached. 4. If you still have difficulty reaching the provider, please page the [facility] (Director on Call) for the Hospitalists listed on amion for assistance.   06/09/2019, 12:48 PM

## 2019-06-09 NOTE — ED Notes (Signed)
Family at bedside. 

## 2019-06-09 NOTE — ED Notes (Signed)
HOME VIMPAT GIVEN, CORRECT MEDICATION VERIFIED BY [facility]. OK TO GIVE ONE TIME DOSE OF HOME MEDS VERBAL ORDER BY MD YATES AFTER CONFIRMATION OF CORRECT MEDICATION BY PHARMACY. VERIFICATION COMPLETED AND MEDICATION GIVEN AS ORDERED AT THIS TIME.

## 2019-06-09 NOTE — ED Notes (Signed)
IV team at bedside to obtain blood and IV

## 2019-06-09 NOTE — Consult Note (Signed)
Renal Service Consult Note [place] 06/09/2019 Sol [place]azing Requesting Physician:  Dr Lorin Mercy, Lenna Sciara.   Reason for Consult:  ESRD pt w/ acute on chronic SDH HPI: The patient is a 53 y.o. year-old with hx of mult myeloma, ESRD on HD, seizure d/o, atrial fib, DM, HTN who was admitted at [facility] for a stem-cell transplant recently.  Per the wife they had harvested his stem cells , then while awaiting the chemoRx, he became very sick and had a seizure and was diagnosed with "sepsis". Ended up per the notes w/ Proteus bacteremia, asp PNA and a subdural hematoma.  He was treated for the bacteremia and SDH was watched and did not need surgery. He did require seizure medications. The stem-cell Tx was put on hold.  Pt was dc'd on 9/29. He had inpt hD On Monday,  But missed HD yesterday due to vomiting.  He came to ED for AMS and vomiting.  Also some altered speech and RLE weakness worse than usual. Neuro and NSurg were consulted and pt admitted.  Asked to see for dialysis.   Pt on dialysis for about 3 years.  Wife provides most of the history.  He denies any CP, SOB, cough or abd pain, or n/v/d today.     ROS  denies CP  no joint pain   no HA  no blurry vision  no rash  no diarrhea  no nausea/ vomiting   Past Medical History  Past Medical History:  Diagnosis Date  . Multiple myeloma ([place])   . Sepsis ([place])   . Subdural hematoma [facility])    Past Surgical History History reviewed. No pertinent surgical history. Family History  Family History  Problem Relation Age of Onset  . Hypertension Mother   . Hypertension Father    Social History  reports that he has never smoked. He has never used smokeless tobacco. He reports previous alcohol use. He reports that he does not use drugs. Allergies  Allergies  Allergen Reactions  . Codeine Swelling   Home medications Prior to Admission medications   Medication Sig Start Date End Date Taking? Authorizing Provider   acyclovir (ZOVIRAX) 400 MG tablet Take 400 mg by mouth at bedtime.   Yes [provider]  amLODipine (NORVASC) 5 MG tablet Take 5 mg by mouth daily.   Yes [provider]  aspirin EC 81 MG tablet Take 81 mg by mouth daily.   Yes [provider]  calcium acetate (PHOSLO) 667 MG capsule Take 2,001 mg by mouth 3 (three) times daily with meals.    Yes [provider]  calcium carbonate (TUMS - DOSED IN MG ELEMENTAL CALCIUM) 500 MG chewable tablet Chew 2 tablets by mouth at bedtime.   Yes [provider]  cinacalcet (SENSIPAR) 60 MG tablet Take 60 mg by mouth every evening.   Yes [provider]  ciprofloxacin (CIPRO) 500 MG tablet Take 500 mg by mouth every evening. For 120 days 06/06/19  Yes [provider]  clonazePAM (KLONOPIN) 0.5 MG tablet Take 0.5 mg by mouth every Monday, Wednesday, and Friday with hemodialysis.   Yes [provider]  fluticasone furoate-vilanterol (BREO ELLIPTA) 200-25 MCG/INH AEPB Inhale 1 puff into the lungs daily.   Yes [provider]  insulin lispro (HUMALOG) 100 UNIT/ML injection Inject 0-4 Units into the skin See admin instructions. Sliding Scale: Dexcom Meter If blood sugar is 150-200 give 2u, if 201-250=3u, 251 or greater give 4u   Yes  [provider]  Lacosamide (VIMPAT) 150 MG TABS Take 150 mg by mouth See admin instructions. Take 1 capsule (130m) by mouth twice daily all day except on Mon, Wed, Fri take 1 capsule in the morning, 1 capsule after dialysis, and 1 capsule at bedtime.   Yes [provider]  lamoTRIgine (LAMICTAL) 150 MG tablet Take 150 mg by mouth 2 (two) times daily.   Yes [provider]  metoprolol tartrate (LOPRESSOR) 25 MG tablet Take 12.5 mg by mouth 2 (two) times daily.   Yes [provider]  multivitamin (RENA-VIT) TABS tablet Take 1 tablet by mouth at bedtime.   Yes [provider]  pantoprazole (PROTONIX) 40 MG tablet Take  40 mg by mouth daily.   Yes [provider]  prochlorperazine (COMPAZINE) 10 MG tablet Take 10 mg by mouth every 6 (six) hours as needed for nausea or vomiting.   Yes [provider]   Liver Function Tests Recent Labs  Lab 06/09/19 1047  AST 18  ALT 9  ALKPHOS 78  BILITOT 0.7  PROT 6.3*  ALBUMIN 3.4*   Recent Labs  Lab 06/09/19 1047  LIPASE 30   CBC Recent Labs  Lab 06/09/19 1047 06/09/19 1100  WBC 8.9  --   NEUTROABS 6.4  --   HGB 9.5* 10.9*  HCT 26.0* 32.0*  MCV 94.2  --   PLT 282  --    Basic Metabolic Panel Recent Labs  Lab 06/09/19 1047 06/09/19 1100  NA 139 138  K 4.7 4.8  CL 101 103  CO2 21*  --   GLUCOSE 119* 118*  BUN 32* 33*  CREATININE 13.08* 12.90*  CALCIUM 8.9  --    Iron/TIBC/Ferritin/ %Sat No results found for: IRON, TIBC, FERRITIN, IRONPCTSAT  Vitals:   06/09/19 1330 06/09/19 1400 06/09/19 1430 06/09/19 1500  BP: (!) 144/79 (!) 137/98 (!) 147/93 137/84  Pulse: 83 78 80 78  Resp: 19 18 11 14   Temp:      TempSrc:      SpO2: 100% 99% 96% 97%    Exam  Gen calm, a little spacey, no distress No rash, cyanosis or gangrene Sclera anicteric, throat clear  No jvd or bruits Chest clear bilat to bases RRR no MRG Abd soft ntnd no mass or ascites +bs GU normal male MS no joint effusions or deformity Ext no LE or UE edema, no wounds or ulcers Neuro is alert, Ox 3 , nf    Home meds:  - amlodipine 5 / metoprolol 12.5 bid  - pantoprazole 40 / cinacalcet 60 hs  - aspirin 81 qd  - acyclovir 400 hs  - clonazepam 0.5 after HD MWF/ lacosamide 150 bid and extra 1564mafter each HD MWF/ lamotrigine 150 bid  - fluticasone- vilanterol 1 qd  - insulin lispro SSI  - prn's/ vitamins/ supplements     Outpt HD: MWF   4h  400/800   92kg   2/2 bath   Hep 2600 (NO HEP here due to SDH)  mircera 225 q2wks, last ?  calcitriol 1.75 ug tiw  venofer 50 /wk     Assessment/ Plan: 1. AMS/ acute on chronic SDH - per primary/ neuro/  NSurg 2. ESRD - on HD MWF. Missed HD yest. Will plan HD in am tomorrow. NO HEPARIN due to subdural bleed.  3. HTN/ vol - cont meds. No sig vol excess on exam. BP's ok.  4. Seizures - on 3 meds. Pt needs clonazepam and lacosamide  after each HD per pt's wife to help reduce post-HD seizures (in addition to regular dosing).  5. DM on insulin 6. Mult myeloma - recent stem-cell Tx plan was aborted due to SDH and bacteremia/ infection.  7. Anemia ckd - Hb 10.9, cont to follow      Kelly Splinter  MD 06/09/2019, 3:24 PM

## 2019-06-09 NOTE — ED Notes (Signed)
ED Provider at bedside. 

## 2019-06-09 NOTE — ED Provider Notes (Signed)
[facility] EMERGENCY DEPARTMENT Provider Note   CSN: [id number] Arrival date & time: 06/09/19  4818  An emergency department physician performed an initial assessment on this suspected stroke patient at [phone].  History   Chief Complaint Chief Complaint  Patient presents with   Code Stroke    HPI Isaiah [name] is a 53 y.o. male.     53 yo M with a history of multiple myeloma comes in with a chief complaint of right-sided weakness.  Initial EMS report was that this occurred at 8 AM this morning.  The patient per the wife has had nausea and vomiting and loose stools for the past few days.  She noticed that he was flinging his right arm since yesterday.  Also stated that he had had some bilateral lower extremity weakness and was kind of dragging his leg on the ground which is new since yesterday.  He was just in the hospital at [facility] for stem cell treatment however the patient was noted to have a Proteus bacteremia and had status and was diagnosed with a acute on chronic subdural hematoma.  The stem cell treatment was canceled.  Patient was then improved and was discharged 2 days ago.  The history is provided by the patient.  Illness Severity:  Moderate Onset quality:  Sudden Duration:  2 days Timing:  Constant Progression:  Worsening Chronicity:  New Associated symptoms: nausea and vomiting   Associated symptoms: no abdominal pain, no chest pain, no congestion, no diarrhea, no fever, no headaches, no myalgias, no rash and no shortness of breath     Past Medical History:  Diagnosis Date   Multiple myeloma ([place])    Sepsis ([place])    Subdural hematoma ([facility])     There are no active problems to display for this patient.   History reviewed. No pertinent surgical history.      Home Medications    Prior to Admission medications   Not on File    Family History Family History  Problem Relation Age of Onset   Hypertension Mother    Hypertension Father      Social History Social History   Tobacco Use   Smoking status: Never Smoker   Smokeless tobacco: Never Used  Substance Use Topics   Alcohol use: Not Currently   Drug use: Never     Allergies   Codeine   Review of Systems Review of Systems  Constitutional: Negative for chills and fever.  HENT: Negative for congestion and facial swelling.   Eyes: Negative for discharge and visual disturbance.  Respiratory: Negative for shortness of breath.   Cardiovascular: Negative for chest pain and palpitations.  Gastrointestinal: Positive for nausea and vomiting. Negative for abdominal pain and diarrhea.  Musculoskeletal: Negative for arthralgias and myalgias.  Skin: Negative for color change and rash.  Neurological: Positive for weakness. Negative for tremors, syncope and headaches.  Psychiatric/Behavioral: Negative for confusion and dysphoric mood.     Physical Exam Updated Vital Signs BP 135/76    Pulse 76    Temp 98.3 F (36.8 C) (Oral)    Resp 12    SpO2 98%   Physical Exam Vitals signs and nursing note reviewed.  Constitutional:      Appearance: He is well-developed.  HENT:     Head: Normocephalic and atraumatic.  Eyes:     Pupils: Pupils are equal, round, and reactive to light.  Neck:     Musculoskeletal: Normal range of motion and neck supple.  Vascular: No JVD.  Cardiovascular:     Rate and Rhythm: Normal rate and regular rhythm.     Heart sounds: No murmur. No friction rub. No gallop.   Pulmonary:     Effort: No respiratory distress.     Breath sounds: No wheezing.  Abdominal:     General: There is no distension.     Tenderness: There is no guarding or rebound.  Musculoskeletal: Normal range of motion.  Skin:    Coloration: Skin is not pale.     Findings: No rash.  Neurological:     Mental Status: He is alert.     Comments: uncomprehendible verbal response.  Right sided weakness.  Psychiatric:        Behavior: Behavior normal.      ED  Treatments / Results  Labs (all labs ordered are listed, but only abnormal results are displayed) Labs Reviewed  CBC - Abnormal; Notable for the following components:      Result Value   RBC 2.76 (*)    Hemoglobin 9.5 (*)    HCT 26.0 (*)    MCH 34.4 (*)    MCHC 36.5 (*)    RDW 16.5 (*)    nRBC 0.6 (*)    All other components within normal limits  DIFFERENTIAL - Abnormal; Notable for the following components:   Lymphs Abs 0.4 (*)    Monocytes Absolute 1.5 (*)    Basophils Absolute 0.2 (*)    All other components within normal limits  COMPREHENSIVE METABOLIC PANEL - Abnormal; Notable for the following components:   CO2 21 (*)    Glucose, Bld 119 (*)    BUN 32 (*)    Creatinine, Ser 13.08 (*)    Total Protein 6.3 (*)    Albumin 3.4 (*)    GFR calc non Af Amer 4 (*)    GFR calc Af Amer 4 (*)    Anion gap 17 (*)    All other components within normal limits  I-STAT CHEM 8, ED - Abnormal; Notable for the following components:   BUN 33 (*)    Creatinine, Ser 12.90 (*)    Glucose, Bld 118 (*)    Calcium, Ion 1.06 (*)    Hemoglobin 10.9 (*)    HCT 32.0 (*)    All other components within normal limits  CBG MONITORING, ED - Abnormal; Notable for the following components:   Glucose-Capillary 124 (*)    All other components within normal limits  SARS CORONAVIRUS 2 (HOSPITAL ORDER, [facility])  CULTURE, BLOOD (ROUTINE X 2)  CULTURE, BLOOD (ROUTINE X 2)  PROTIME-INR  APTT  LIPASE, BLOOD  URINALYSIS, ROUTINE W REFLEX MICROSCOPIC  LACTIC ACID, PLASMA  LACTIC ACID, PLASMA    EKG EKG Interpretation  Date/Time:  Thursday June 09 2019 10:29:53 EDT Ventricular Rate:  80 PR Interval:    QRS Duration: 92 QT Interval:  431 QTC Calculation: 498 R Axis:   77 Text Interpretation:  Sinus rhythm Prolonged PR interval Nonspecific T abnormalities, lateral leads Borderline prolonged QT interval No old tracing to compare Confirmed by Deno Etienne [phone]) on  06/09/2019 10:32:30 AM   Radiology Dg Chest Port 1 View  Result Date: 06/09/2019 CLINICAL DATA:  Altered mental status EXAM: PORTABLE CHEST 1 VIEW COMPARISON:  01/06/2016 FINDINGS: The heart size and mediastinal contours are within normal limits. Both lungs are clear. The visualized skeletal structures are unremarkable. IMPRESSION: No acute cardiopulmonary findings. Electronically Signed   By: Hart Carwin  Plundo M.D.   On: 06/09/2019 10:50   Ct Head Code Stroke Wo Contrast  Result Date: 06/09/2019 CLINICAL DATA:  Code stroke. Focal neuro deficit, less than 6 hours, stroke suspected. Additional history provided: Last known well 0 800, right-sided weakness, slurred speech, right facial droop. EXAM: CT HEAD WITHOUT CONTRAST TECHNIQUE: Contiguous axial images were obtained from the base of the skull through the vertex without intravenous contrast. COMPARISON:  Brain MRI 01/07/2016 FINDINGS: Brain: There is a mixed density holohemispheric subdural collection on the right containing internal foci of hyperdense acute/subacute blood products. This measures up to 1.5 cm in greatest thickness. There is mass effect upon the underlying right cerebral hemisphere. 4 mm leftward midline shift. No new cortical infarct is identified. Multiple redemonstrated chronic infarcts as follows. Moderate-sized chronic infarct within the left frontal lobe. Small chronic infarct within the right frontal lobe. Small chronic infarct within the right temporal occipital lobe. Chronic bilateral cerebellar lacunar infarcts. Vascular: No hyperdense vessel. Atherosclerotic calcification of the carotid artery siphons and vertebrobasilar system. Skull: No calvarial fracture. Sinuses/Orbits: Visualized orbits demonstrate no acute abnormality. Partially imaged mucous retention cyst within the inferior left maxillary sinus. Mild ethmoid sinus mucosal thickening. No significant mastoid effusion. Left suboccipital scalp sebaceous cyst. These results  were called by telephone at the time of interpretation on 06/09/2019 at 10:30 am to provider Dr. Leonel Ramsay, who verbally acknowledged these results. ASPECTS [facility] Stroke Program Early CT Score) - Ganglionic level infarction (caudate, lentiform nuclei, internal capsule, insula, M1-M3 cortex): 7 - Supraganglionic infarction (M4-M6 cortex): 3 Total score (0-10 with 10 being normal): 10 (when discounting chronic infarcts) IMPRESSION: 1. Mixed density right holohemispheric subdural collection containing internal foci of acute/subacute blood products, measuring up to 1.5 cm in thickness. Mass effect upon the underlying right cerebral hemisphere. 4 mm leftward midline shift. 2. No new cortical infarct is identified. Redemonstrated chronic infarcts within the left frontal lobe, right frontal lobe, right temporal occipital lobes and bilateral cerebellar hemispheres as described. Electronically Signed   By: Kellie Simmering   On: 06/09/2019 10:42    Procedures Procedures (including critical care time)  Medications Ordered in ED Medications  sodium chloride flush (NS) 0.9 % injection 3 mL (3 mLs Intravenous Given 06/09/19 1151)     Initial Impression / Assessment and Plan / ED Course  I have reviewed the triage vital signs and the nursing notes.  Pertinent labs & imaging results that were available during my care of the patient were reviewed by me and considered in my medical decision making (see chart for details).        53 yo M with a chief complaint of right-sided weakness and generalized fatigue with nausea and vomiting over the past couple days.  He was just in the hospital at [name] with Proteus bacteremia and seizure activity.  He was just discharged 2 days ago.  Yesterday had some worsening right-sided weakness and a couple episodes of emesis.  Arrived today as a code stroke though was exhibiting some weakness as of yesterday per the family.  He is not a TPA candidate due to his recent intracranial  hemorrhage.  CT scan today also consistent with an intracranial hemorrhage.  I am unsure of the exact way that the bleed was measured at the outside hospital but seems to be somewhat bigger based on the measurements that were done today.  I discussed the case with Dr. Ellene Route who personally reviewed the CT imaging of the patient's brain.  He was not sure  that the symptoms he was presenting with were consistent with the location of the bleeding.  He did agree based on the patient's history that he likely needs to stay in the hospital.  He will come and consult on the patient.  As the patient's were recently at [name] and were just discharged did discuss with him the option to talk with [facility] about a transfer.  The family would prefer to have their care here.  States it is difficult for her to travel to [name] to see him.  Will discuss with the hospitalist.  CRITICAL CARE Performed by: Cecilio Asper   Total critical care time: 35 minutes  Critical care time was exclusive of separately billable procedures and treating other patients.  Critical care was necessary to treat or prevent imminent or life-threatening deterioration.  Critical care was time spent personally by me on the following activities: development of treatment plan with patient and/or surrogate as well as nursing, discussions with consultants, evaluation of patient's response to treatment, examination of patient, obtaining history from patient or surrogate, ordering and performing treatments and interventions, ordering and review of laboratory studies, ordering and review of radiographic studies, pulse oximetry and re-evaluation of patient's condition.  The patients results and plan were reviewed and discussed.   Any x-rays performed were independently reviewed by myself.   Differential diagnosis were considered with the presenting HPI.  Medications  sodium chloride flush (NS) 0.9 % injection 3 mL (3 mLs Intravenous Given  06/09/19 1151)    Vitals:   06/09/19 1115 06/09/19 1145 06/09/19 1200 06/09/19 1224  BP: (!) 142/74 (!) 144/78 135/76   Pulse: 77 74 76   Resp: _0 Temp:    98.3 F (36.8 C)  TempSrc:    Oral  SpO2: 98% 97% 98%     Final diagnoses:  SDH (subdural hematoma) (HCC)  Right sided weakness    Admission/ observation were discussed with the admitting physician, patient and/or family and they are comfortable with the plan.    Final Clinical Impressions(s) / ED Diagnoses   Final diagnoses:  SDH (subdural hematoma) ([place])  Right sided weakness    ED Discharge Orders    None       Deno Etienne, DO 06/09/19 1258

## 2019-06-09 NOTE — ED Notes (Signed)
Dinner tray ordered.

## 2019-06-09 NOTE — ED Notes (Signed)
IV team unable to get blood from IV. Will call phleb to come and try.

## 2019-06-09 NOTE — ED Notes (Signed)
CBG Results of 101 reported to [place], Therapist, sports.

## 2019-06-09 NOTE — Consult Note (Signed)
Reason for Consult: Subdural hematoma Referring Physician: Karmen Bongo, MD  [facility] is an 53 y.o. male.  HPI: Patient is a 53 year old male who has had a recent hospitalization at [name] for stem cell transplant.  He apparently had a complicated course with a bacteremia and was noted to have a small subdural hematoma on the right side.  He has had a history of an old stroke on the left side.  Patient was discharged this pastIntractable back and leg pain with numbness subdural hematoma Tuesday and the wife notes that he has become progressively weaker on his right side.  He was having difficulty keeping his meds down and was nauseated.  She notes that his right arm has been weaker and has been holding in an awkward posture and she was concerned that he might have a stroke.  CT scan performed today demonstrates that there is a holohemispheric subdural on the right side which has mixed features of acute subacute and chronic blood.  There is approximately 2 to 3 mm of midline shift.  Is unclear with the previous scans showed from [facility].  Past Medical History:  Diagnosis Date  . DM type 2 (diabetes mellitus, type 2) ([place]) 06/09/2019  . Essential hypertension 06/09/2019  . Multiple myeloma ([place])   . Seizure disorder ([employer]) 06/09/2019  . Sepsis ([place])   . Subdural hematoma ([facility])     History reviewed. No pertinent surgical history.  Family History  Problem Relation Age of Onset  . Hypertension Mother   . Hypertension Father     Social History:  reports that he has never smoked. He has never used smokeless tobacco. He reports previous alcohol use. He reports that he does not use drugs.  Allergies:  Allergies  Allergen Reactions  . Codeine Swelling    Medications: I have reviewed the patient's current medications.  Results for orders placed or performed during the hospital encounter of 06/09/19 (from the past 48 hour(s))  CBG monitoring, ED     Status:  Abnormal   Collection Time: 06/09/19 10:01 AM  Result Value Ref Range   Glucose-Capillary 124 (H) 70 - 99 mg/dL  Protime-INR     Status: None   Collection Time: 06/09/19 10:47 AM  Result Value Ref Range   Prothrombin Time 14.7 11.4 - 15.2 seconds   INR 1.2 0.8 - 1.2    Comment: (NOTE) INR goal varies based on device and disease states. Performed at [facility], [place] [address] [place], [place] [address]   APTT     Status: None   Collection Time: 06/09/19 10:47 AM  Result Value Ref Range   aPTT 31 24 - 36 seconds    Comment: Performed at [facility] [address], [place], [place] [address]  CBC     Status: Abnormal   Collection Time: 06/09/19 10:47 AM  Result Value Ref Range   WBC 8.9 4.0 - 10.5 K/uL   RBC 2.76 (L) 4.22 - 5.81 MIL/uL   Hemoglobin 9.5 (L) 13.0 - 17.0 g/dL   HCT 26.0 (L) 39.0 - 52.0 %   MCV 94.2 80.0 - 100.0 fL   MCH 34.4 (H) 26.0 - 34.0 pg   MCHC 36.5 (H) 30.0 - 36.0 g/dL   RDW 16.5 (H) 11.5 - 15.5 %   Platelets 282 150 - 400 K/uL   nRBC 0.6 (H) 0.0 - 0.2 %    Comment: Performed at [facility]  [address] [place], [place] [address]  Differential     Status: Abnormal   Collection Time: 06/09/19 10:47 AM  Result Value Ref Range   Neutrophils Relative % 72 %   Neutro Abs 6.4 1.7 - 7.7 K/uL   Lymphocytes Relative 5 %   Lymphs Abs 0.4 (L) 0.7 - 4.0 K/uL   Monocytes Relative 17 %   Monocytes Absolute 1.5 (H) 0.1 - 1.0 K/uL   Eosinophils Relative 4 %   Eosinophils Absolute 0.4 0.0 - 0.5 K/uL   Basophils Relative 2 %   Basophils Absolute 0.2 (H) 0.0 - 0.1 K/uL   Immature Granulocytes 0 %   Abs Immature Granulocytes 0.03 0.00 - 0.07 K/uL    Comment: Performed at [facility] [address] [place],  [address]  Comprehensive metabolic panel     Status: Abnormal   Collection Time: 06/09/19 10:47 AM  Result Value Ref Range   Sodium 139 135 - 145 mmol/L   Potassium 4.7 3.5 - 5.1 mmol/L   Chloride 101 98 - 111 mmol/L    CO2 21 (L) 22 - 32 mmol/L   Glucose, Bld 119 (H) 70 - 99 mg/dL   BUN 32 (H) 6 - 20 mg/dL   Creatinine, Ser 13.08 (H) 0.61 - 1.24 mg/dL   Calcium 8.9 8.9 - 10.3 mg/dL   Total Protein 6.3 (L) 6.5 - 8.1 g/dL   Albumin 3.4 (L) 3.5 - 5.0 g/dL   AST 18 15 - 41 U/L   ALT 9 0 - 44 U/L   Alkaline Phosphatase 78 38 - 126 U/L   Total Bilirubin 0.7 0.3 - 1.2 mg/dL   GFR calc non Af Amer 4 (L) >60 mL/min   GFR calc Af Amer 4 (L) >60 mL/min   Anion gap 17 (H) 5 - 15    Comment: Performed at [facility], [place] [address] [place],  [address]  SARS Coronavirus 2 [facility] order, Performed in [name] hospital lab) Nasopharyngeal Nasopharyngeal Swab     Status: None   Collection Time: 06/09/19 10:47 AM   Specimen: Nasopharyngeal Swab  Result Value Ref Range   SARS Coronavirus 2 NEGATIVE NEGATIVE    Comment: (NOTE) If result is NEGATIVE SARS-CoV-2 target nucleic acids are NOT DETECTED. The SARS-CoV-2 RNA is generally detectable in upper and lower  respiratory specimens during the acute phase of infection. The lowest  concentration of SARS-CoV-2 viral copies this assay can detect is 250  copies / mL. A negative result does not preclude SARS-CoV-2 infection  and should not be used as the sole basis for treatment or other  patient management decisions.  A negative result may occur with  improper specimen collection / handling, submission of specimen other  than nasopharyngeal swab, presence of viral mutation(s) within the  areas targeted by this assay, and inadequate number of viral copies  (<250 copies / mL). A negative result must be combined with clinical  observations, patient history, and epidemiological information. If result is POSITIVE SARS-CoV-2 target nucleic acids are DETECTED. The SARS-CoV-2 RNA is generally detectable in upper and lower  respiratory specimens dur ing the acute phase of infection.  Positive  results are indicative of active infection with SARS-CoV-2.   Clinical  correlation with patient history and other diagnostic information is  necessary to determine patient infection status.  Positive results do  not rule out bacterial infection or co-infection with other viruses. If result is PRESUMPTIVE POSTIVE SARS-CoV-2 nucleic acids MAY BE PRESENT.  A presumptive positive result was obtained on the submitted specimen  and confirmed on repeat testing.  While 2019 novel coronavirus  (SARS-CoV-2) nucleic acids may be present in the submitted sample  additional confirmatory testing may be necessary for epidemiological  and / or clinical management purposes  to differentiate between  SARS-CoV-2 and other Sarbecovirus currently known to infect humans.  If clinically indicated additional testing with an alternate test  methodology [phone]) is advised. The SARS-CoV-2 RNA is generally  detectable in upper and lower respiratory sp ecimens during the acute  phase of infection. The expected result is Negative. Fact Sheet for Patients:  StrictlyIdeas.no Fact Sheet for Healthcare Providers: BankingDealers.co.za This test is not yet approved or cleared by the [place] FDA and has been authorized for detection and/or diagnosis of SARS-CoV-2 by FDA under an Emergency Use Authorization (EUA).  This EUA will remain in effect (meaning this test can be used) for the duration of the COVID-19 declaration under Section 564(b)(1) of the Act, 21 U.S.C. section 360bbb-3(b)(1), unless the authorization is terminated or revoked sooner. Performed at [facility], [place] [address], [place], [place] [address]   Lipase, blood     Status: None   Collection Time: 06/09/19 10:47 AM  Result Value Ref Range   Lipase 30 11 - 51 U/L    Comment: Performed at [facility] [address] [place], [place] [address]  I-stat chem 8, ED     Status: Abnormal   Collection Time: 06/09/19 11:00 AM  Result Value Ref Range    Sodium 138 135 - 145 mmol/L   Potassium 4.8 3.5 - 5.1 mmol/L   Chloride 103 98 - 111 mmol/L   BUN 33 (H) 6 - 20 mg/dL   Creatinine, Ser 12.90 (H) 0.61 - 1.24 mg/dL   Glucose, Bld 118 (H) 70 - 99 mg/dL   Calcium, Ion 1.06 (L) 1.15 - 1.40 mmol/L   TCO2 23 22 - 32 mmol/L   Hemoglobin 10.9 (L) 13.0 - 17.0 g/dL   HCT 32.0 (L) 39.0 - 52.0 %  Lactic acid, plasma     Status: None   Collection Time: 06/09/19  2:14 PM  Result Value Ref Range   Lactic Acid, Venous 0.8 0.5 - 1.9 mmol/L    Comment: Performed at [facility] [address], [place],  [address]  CBG monitoring, ED     Status: Abnormal   Collection Time: 06/09/19  5:07 PM  Result Value Ref Range   Glucose-Capillary 101 (H) 70 - 99 mg/dL    Mr Brain Wo Contrast  Result Date: 06/09/2019 CLINICAL DATA:  Multiple myeloma, sepsis, diabetes. Subdural hemorrhage on CT today EXAM: MRI HEAD WITHOUT CONTRAST TECHNIQUE: Multiplanar, multiecho pulse sequences of the brain and surrounding structures were obtained without intravenous contrast. COMPARISON:  CT head 06/09/2019 FINDINGS: Brain: Subdural hematoma on the right has complex signal characteristics compatible with subacute to chronic blood. Based on CT, there may be a small amount of more recent hemorrhage posteriorly. There are multiple septations within the fluid. The subdural fluid measures approximately 14 mm in thickness in the parietal lobe and approximately 9 mm in thickness in the frontal lobe. There is mass-effect on the right hemisphere with 5 mm midline shift to the left. Ventricle size normal.  Negative for acute infarct Chronic hemorrhagic infarct left posterior frontal lobe. Chronic hemorrhagic infarct right posterior temporal/occipital lobe. Moderate atrophy. Vascular: Normal arterial flow voids Skull and upper cervical spine: No focal  bony lesion. Sinuses/Orbits: Mucosal edema paranasal sinuses. Cyst left maxillary sinus. Negative orbit. Other: None IMPRESSION:  Right-sided subdural hematoma appears predominantly subacute although there may be an area of more recent hemorrhage posteriorly based on high density on CT. Fluid measures up to 14 mm in thickness in the parietal lobe and 9 mm in thickness in the frontal lobe. 5 mm midline shift to the left. Negative for acute infarct Generalized atrophy with chronic ischemia. Electronically Signed   By: Franchot Gallo M.D.   On: 06/09/2019 16:58   Dg Chest Port 1 View  Result Date: 06/09/2019 CLINICAL DATA:  Altered mental status EXAM: PORTABLE CHEST 1 VIEW COMPARISON:  01/06/2016 FINDINGS: The heart size and mediastinal contours are within normal limits. Both lungs are clear. The visualized skeletal structures are unremarkable. IMPRESSION: No acute cardiopulmonary findings. Electronically Signed   By: Davina Poke M.D.   On: 06/09/2019 10:50   Ct Head Code Stroke Wo Contrast  Result Date: 06/09/2019 CLINICAL DATA:  Code stroke. Focal neuro deficit, less than 6 hours, stroke suspected. Additional history provided: Last known well 0 800, right-sided weakness, slurred speech, right facial droop. EXAM: CT HEAD WITHOUT CONTRAST TECHNIQUE: Contiguous axial images were obtained from the base of the skull through the vertex without intravenous contrast. COMPARISON:  Brain MRI 01/07/2016 FINDINGS: Brain: There is a mixed density holohemispheric subdural collection on the right containing internal foci of hyperdense acute/subacute blood products. This measures up to 1.5 cm in greatest thickness. There is mass effect upon the underlying right cerebral hemisphere. 4 mm leftward midline shift. No new cortical infarct is identified. Multiple redemonstrated chronic infarcts as follows. Moderate-sized chronic infarct within the left frontal lobe. Small chronic infarct within the right frontal lobe. Small chronic infarct within the right temporal occipital lobe. Chronic bilateral cerebellar lacunar infarcts. Vascular: No hyperdense  vessel. Atherosclerotic calcification of the carotid artery siphons and vertebrobasilar system. Skull: No calvarial fracture. Sinuses/Orbits: Visualized orbits demonstrate no acute abnormality. Partially imaged mucous retention cyst within the inferior left maxillary sinus. Mild ethmoid sinus mucosal thickening. No significant mastoid effusion. Left suboccipital scalp sebaceous cyst. These results were called by telephone at the time of interpretation on 06/09/2019 at 10:30 am to provider Dr. Leonel Ramsay, who verbally acknowledged these results. ASPECTS [facility] Stroke Program Early CT Score) - Ganglionic level infarction (caudate, lentiform nuclei, internal capsule, insula, M1-M3 cortex): 7 - Supraganglionic infarction (M4-M6 cortex): 3 Total score (0-10 with 10 being normal): 10 (when discounting chronic infarcts) IMPRESSION: 1. Mixed density right holohemispheric subdural collection containing internal foci of acute/subacute blood products, measuring up to 1.5 cm in thickness. Mass effect upon the underlying right cerebral hemisphere. 4 mm leftward midline shift. 2. No new cortical infarct is identified. Redemonstrated chronic infarcts within the left frontal lobe, right frontal lobe, right temporal occipital lobes and bilateral cerebellar hemispheres as described. Electronically Signed   By: Kellie Simmering   On: 06/09/2019 10:42    Review of Systems  Constitutional: Negative.   HENT: Negative.   Eyes: Negative.   Respiratory: Negative.   Gastrointestinal: Negative.   Genitourinary: Negative.   Musculoskeletal: Negative.   Skin: Negative.   Neurological: Positive for sensory change and focal weakness.  Endo/Heme/Allergies: Negative.   Psychiatric/Behavioral: Negative.    Blood pressure (!) 155/87, pulse 80, temperature 98.3 F (36.8 C), temperature source Oral, resp. rate 17, SpO2 99 %. Physical Exam  Constitutional: He is oriented to person, place, and time. He appears well-developed and  well-nourished.  HENT:  Head: Normocephalic and atraumatic.  Eyes: Pupils are equal, round, and reactive to light. Conjunctivae and EOM are normal.  Neck: Normal range of motion. Neck supple.  Cardiovascular: Normal rate and regular rhythm.  Respiratory: Effort normal.  GI: Soft. Bowel sounds are normal.  Musculoskeletal: Normal range of motion.  Neurological: He is alert and oriented to person, place, and time.  Right side appears weaker than left side to confrontational testing and there is a slight right sided drift.  Skin: Skin is warm and dry.  Psychiatric: He has a normal mood and affect. His behavior is normal. Judgment and thought content normal.    Assessment/Plan: Chronic and acute and subacute subdural hematoma on the right with slight right to left shift.  The degree of the hematoma does not explain his right-sided weakness.  Believe that a period of observation should take place to see if there is any further deterioration.  MRI of the brain does not show evidence of an acute infarct.  I will follow along and will see if we can determine how big his subdural hematoma was when scans were done at [name].  Blanchie Dessert Trine [name] 06/09/2019, 8:16 PM

## 2019-06-09 NOTE — Consult Note (Addendum)
Neurology Consultation  Reason for Consult: Code stroke Referring Physician: Tyrone Nine  CC: increased right sided weakness per wife  History is obtained from: wife  HPI: [facility] is a 53 y.o. male with Hx of multiple myeloma, subdural hematoma, stroke. Recently was at [name] initially for a stem cell transplant. During the hospitalization he developed staph proteus infection and a Subdural Hematoma. He returned home on Tuesday and wife noted he was weaker than normal and had emesis. Wednesday she noted he was much weaker. He was having difficulty keeping his medications down. Today she noted his right arm was held different and she was fearfully he had another stroke.   LKW: 800 06/09/2019 tpa given?: no, ICH Premorbid modified Rankin scale (mRS): 1 NIH StrokeScore:2   ROS: A 14 point ROS was performed and is negative except as noted in the HPI.   Past Medical History:  Diagnosis Date  . Multiple myeloma ([place])   . Sepsis ([place])   . Subdural hematoma ([facility])     Family History  Problem Relation Age of Onset  . Hypertension Mother   . Hypertension Father      Social History:   reports that he has never smoked. He does not have any smokeless tobacco history on file. He reports previous alcohol use. He reports that he does not use drugs.  Medications  Current Facility-Administered Medications:  .  sodium chloride flush (NS) 0.9 % injection 3 mL, 3 mL, Intravenous, Once, Deno Etienne, DO No current outpatient medications on file.   Exam: Current vital signs: There were no vitals taken for this visit. Vital signs in last 24 hours:    Physical Exam  Constitutional: Appears well-developed and well-nourished.  Psych: Affect appropriate to situation Eyes: No scleral injection HENT: No OP obstrucion Head: Normocephalic.  Cardiovascular: Normal rate and regular rhythm.  Respiratory: Effort normal, non-labored breathing GI: Soft.  No distension. There is no tenderness.   Skin: WDI  Neuro: Mental Status: Patient is awake, alert, oriented to hospital but thinks it is September and 2022 Patient is unable to give a clear and coherent history. Slow though process No signs of aphasia or neglect Cranial Nerves: II: Visual Fields are full.  III,IV, VI: EOMI without ptosis or diploplia. Pupils equal, round and reactive to light V: Facial sensation is symmetric to temperature VII: right facial droop --old  VIII: hearing is intact to voice X: Palat elevates symmetrically XI: Shoulder shrug is symmetric. XII: tongue is midline without atrophy or fasciculations.  Motor: Mild right hemiparesis with some spasticity, 4/5  Sensory: Sensation is symmetric to light touch and temperature in the arms and legs. Deep Tendon Reflexes: 2+ and symmetric in the biceps and patellae.  Plantars: Toes are downgoing bilaterally.  Cerebellar: FNF and HKS are intact bilaterally     Labs I have reviewed labs in epic and the results pertinent to this consultation are:   CBC No results found for: WBC, RBC, HGB, HCT, PLT, MCV, MCH, MCHC, RDW, LYMPHSABS, MONOABS, EOSABS, BASOSABS  CMP  No results found for: NA, K, CL, CO2, GLUCOSE, BUN, CREATININE, CALCIUM, PROT, ALBUMIN, AST, ALT, ALKPHOS, BILITOT, GFRNONAA, GFRAA  Lipid Panel  No results found for: CHOL, TRIG, HDL, CHOLHDL, VLDL, LDLCALC, LDLDIRECT   Imaging I have reviewed the images obtained:  CT-scan of the brain--mixed density right holohemispheric subdural collection containing internal foci of acute/subacute blood products, measuring up to 1.5 cm thickness.  Mass-effect upon the underlying right cerebral hemisphere.  2 mm leftward  midline shift.  No new cortical infarct is identified.  MRI examination of the brain--pending  Etta Quill PA-C [facility] [phone]  M-F  (9:00 am- 5:00 PM)  06/09/2019, 10:41 AM   I have seen the patient  And reviewed the above note.   Assessment:  This is a  53 year old male presented to the hospital with increased right-sided weakness.  I do not think that this weakness is due to the SDH directly, but wonder if he could have some recrudescence of previous symptoms due to either this or some other physiological stressor(?gastroenteritis, bacteremia, dehydration, etc).   The measurements on the SDH appear worse than previously noted, but the change is small and a direct comparison to the OSH images would be better.   Unless MRI showed stroke, would not favor attributing today's events to a new ischemic event.   Recommendations: - No antiplatelets - MRI brain without contrast - Obtain OSH images - will follow.   Roland Rack, MD [facility] [phone]  If 7pm- 7am, please page neurology on call as listed in [place].

## 2019-06-09 NOTE — ED Notes (Addendum)
MRI called for second time to inquire about time- wastold pt was waiting for negative covid result before they could do this scan. Covid results are back and negative.  Also could not give me a time for scan.

## 2019-06-09 NOTE — H&P (Signed)
History and Physical    [facility] VXY:[id number] DOB: Mar 20, 1966 DOA: 06/09/2019  PCP: [name], MD Consultants:  Between - nephrology; [place] - oncology, [facility] Patient coming from:  Home - lives with wife, son; Donald Prose: Wife, [phone]  Chief Complaint: stroke symptoms  HPI: Isaiah [name] is a 53 y.o. male with medical history significant of CVA; seizure d/o; OSA on CPAP; PE on Coumadin; multiple myeloma; HTN; ESRD on MWF HD; DM; and afib presenting with R facial droop, R sided weakness, and slurred speech this AM.  He is unable to receive tPA due to recent SDH.  After he got home from the hospital, he was trying to get up the stairs and around the house.  He walked with a walker some but mostly sat in a chair.  He was able to swallow.  Everything was good while sleeping.  About 7AM yersterday, he took his medicine and kind of threw it up.  He noticed weakness, difficulty getting up onto to walker.  He was took weak to go to HD.  He was able to tolerate breakfast, lunch, dinner. He was able to walk but was fatigued.  This AM, he got up and took his medicine.  An hour later, he called his wife and was vomiting.  He was unable to lift his legs.  His right LE was weaker than she had ever seen it.  His speech was worse than normal - generally understandable speech but more slurred than normal. His R facial droop was more than usual.  He was admitted at [facility] from 9/17-29 for planned autologous stem cell transplant for multiple myeloma.  However, he had seizure activity following HD on 9/18, and head CT revealed an acute on chronic R-sided SDH.  He was also found to have Proteus mirabilis bacteremia (pan-sensitive), which was treated with Zosyn.  He was found to have B renal stones, but urology deferred removal in the setting of bacteremia.  He was transitioned to Cipro for continuous prophylactic therapy.  He was also noted to have aspiration PNA.  For his seizures, he was changed to Vimpat and  Depabote with Klonopin after HD.  Given his multitude of medical problems, stem cell transplantation was held.   ED Course: Discharged from [facility] Tuesday - multiple myeloma, Proteus bacteremia, seizures, SDH.  He got better and was discharged Tuesday.  Doing ok until yesterday - worse on the right, trouble with words.  Dr. Leonel Ramsay saw him.  ?change in CT.  Dr. Ellene Route looked at the CT - probably needs overnight evaluation, would consider evacuation.  Review of Systems: As per HPI; otherwise review of systems reviewed and negative.   Ambulatory Status:  Ambulates with a walker  Past Medical History:  Diagnosis Date   DM type 2 (diabetes mellitus, type 2) ([place]) 06/09/2019   Essential hypertension 06/09/2019   Multiple myeloma ([place])    Seizure disorder ([place]) 06/09/2019   Sepsis ([place])    Subdural hematoma ([place])     History reviewed. No pertinent surgical history.  Social History   Socioeconomic History   Marital status: Married    Spouse name: Not on file   Number of children: Not on file   Years of education: Not on file   Highest education level: Not on file  Occupational History   Not on file  Social Needs   Financial resource strain: Not on file   Food insecurity    Worry: Not on file    Inability: Not on file  Transportation needs    Medical: Not on file    Non-medical: Not on file  Tobacco Use   Smoking status: Never Smoker   Smokeless tobacco: Never Used  Substance and Sexual Activity   Alcohol use: Not Currently   Drug use: Never   Sexual activity: Yes  Lifestyle   Physical activity    Days per week: Not on file    Minutes per session: Not on file   Stress: Not on file  Relationships   Social connections    Talks on phone: Not on file    Gets together: Not on file    Attends religious service: Not on file    Active member of club or organization: Not on file    Attends meetings of clubs or organizations: Not on file    Relationship  status: Not on file   Intimate partner violence    Fear of current or ex partner: Not on file    Emotionally abused: Not on file    Physically abused: Not on file    Forced sexual activity: Not on file  Other Topics Concern   Not on file  Social History Narrative   Not on file    Allergies  Allergen Reactions   Codeine Swelling    Family History  Problem Relation Age of Onset   Hypertension Mother    Hypertension Father     Prior to Admission medications   Not on File    Physical Exam: Vitals:   06/09/19 1330 06/09/19 1400 06/09/19 1430 06/09/19 1500  BP: (!) 144/79 (!) 137/98 (!) 147/93 137/84  Pulse: 83 78 80 78  Resp: _0 Temp:      TempSrc:      SpO2: 100% 99% 96% 97%      General:  Appears calm and comfortable and is NAD  Eyes:  L exotropia, EOMI, normal lids, iris  ENT:  grossly normal hearing, lips & tongue, mmm; appropriate dentition  Neck:  no LAD, masses or thyromegaly  Cardiovascular:  RRR, no m/r/g. No LE edema.   Respiratory:   CTA bilaterally with no wheezes/rales/rhonchi.  Normal respiratory effort.  Abdomen:  soft, NT, ND, NABS  Back:   normal alignment, no CVAT  Skin:  no rash or induration seen on limited exam  Musculoskeletal:  B extremity weakness 4/5 but somewhat worse on the right compared to left, particularly with grip strength  Psychiatric:  blunted mood and affect, speech mildly dysarthric and not fully conversational, ?mild confusion and/or expressive aphasia  Neurologic:  Chronic R facial droop which his wife thinks is somewhat worse than baseline, sensation intact    Radiological Exams on Admission: Dg Chest Port 1 View  Result Date: 06/09/2019 CLINICAL DATA:  Altered mental status EXAM: PORTABLE CHEST 1 VIEW COMPARISON:  01/06/2016 FINDINGS: The heart size and mediastinal contours are within normal limits. Both lungs are clear. The visualized skeletal structures are unremarkable. IMPRESSION: No acute  cardiopulmonary findings. Electronically Signed   By: Davina Poke M.D.   On: 06/09/2019 10:50   Ct Head Code Stroke Wo Contrast  Result Date: 06/09/2019 CLINICAL DATA:  Code stroke. Focal neuro deficit, less than 6 hours, stroke suspected. Additional history provided: Last known well 0 800, right-sided weakness, slurred speech, right facial droop. EXAM: CT HEAD WITHOUT CONTRAST TECHNIQUE: Contiguous axial images were obtained from the base of the skull through the vertex without intravenous contrast. COMPARISON:  Brain MRI 01/07/2016 FINDINGS: Brain: There is a  mixed density holohemispheric subdural collection on the right containing internal foci of hyperdense acute/subacute blood products. This measures up to 1.5 cm in greatest thickness. There is mass effect upon the underlying right cerebral hemisphere. 4 mm leftward midline shift. No new cortical infarct is identified. Multiple redemonstrated chronic infarcts as follows. Moderate-sized chronic infarct within the left frontal lobe. Small chronic infarct within the right frontal lobe. Small chronic infarct within the right temporal occipital lobe. Chronic bilateral cerebellar lacunar infarcts. Vascular: No hyperdense vessel. Atherosclerotic calcification of the carotid artery siphons and vertebrobasilar system. Skull: No calvarial fracture. Sinuses/Orbits: Visualized orbits demonstrate no acute abnormality. Partially imaged mucous retention cyst within the inferior left maxillary sinus. Mild ethmoid sinus mucosal thickening. No significant mastoid effusion. Left suboccipital scalp sebaceous cyst. These results were called by telephone at the time of interpretation on 06/09/2019 at 10:30 am to provider Dr. Leonel Ramsay, who verbally acknowledged these results. ASPECTS [facility] Stroke Program Early CT Score) - Ganglionic level infarction (caudate, lentiform nuclei, internal capsule, insula, M1-M3 cortex): 7 - Supraganglionic infarction (M4-M6 cortex): 3  Total score (0-10 with 10 being normal): 10 (when discounting chronic infarcts) IMPRESSION: 1. Mixed density right holohemispheric subdural collection containing internal foci of acute/subacute blood products, measuring up to 1.5 cm in thickness. Mass effect upon the underlying right cerebral hemisphere. 4 mm leftward midline shift. 2. No new cortical infarct is identified. Redemonstrated chronic infarcts within the left frontal lobe, right frontal lobe, right temporal occipital lobes and bilateral cerebellar hemispheres as described. Electronically Signed   By: Kellie Simmering   On: 06/09/2019 10:42    EKG: Independently reviewed.  NSR with rate 80; nonspecific ST changes with no evidence of acute ischemia   Labs on Admission: I have personally reviewed the available labs and imaging studies at the time of the admission.  Pertinent labs:   CO2 21 Glucose 119 BUN 32/Creatinine 13.08/GFR 4 Anion gap 17 Albumin 3.4 WBC 8.9 Hgb 9.5 INR 1.2   Assessment/Plan Principal Problem:   Acute on chronic intracranial subdural hematoma (HCC) Active Problems:   Multiple myeloma not having achieved remission (HCC)   ESRD on hemodialysis ([place])   Essential hypertension   DM type 2 (diabetes mellitus, type 2) (HCC)   Seizure disorder (HCC)   Aspiration pneumonia (HCC)   History of bacteremia   Atrial fibrillation, chronic (HCC)   Acute on chronic SDH -Patient with prior admission and found to have acute on chronic R-sided SDH -He had worsening neurologic symptoms this AM, concerning for progressive disease/new CVA -During prior hospitalization at [facility], there was thought to be 2 mm midline shift -Repeat CT indicates this may have increased to 74m -Neurology consulted -Neurosurgery also consulted -MRI has been ordered and is pending -No AC or antiplatelet drugs  Multiple myeloma -Patient had been previously followed for MGUS but bone marrow biopsy in 2018 showed almost 20% plasma cell  infiltration -Negative skeletal survey in 2/19 -Treatment was deferred in 4/19 due to lack of symptoms -8/19 bone marrow biopsy with 25% abnormal plasma cells, FISH with IgG kappa -Skeletal survey remained negative -[facility] but this was delayed due to other medical problems that occurred while hospitalized  ESRD on HD -Patient on chronic MWF HD -Nephrology prn order set utilized -He does not appear to be volume overloaded or otherwise in need of acute HD, but was due yesterday and missed treatment -Nephrology will see the patient and arrange -Continue Phoslo, Sensipar, renavit -  Continue Klonopin  Recent aspiration PNA -Noted on 9/16 CT -Started on Zosyn and transitioned to Cipro 9/24 -Continue PO Cipro for now -Low current concern for persistent aspiration since this event appeared to happen in the setting of a seizure  Seizure d/o -Started after patient's CVA -Continue Vimpat, Lamictal  Afib -Reported h/o afib and PFO -IVC filter is in place -He is not a candidate for [facility] -He was previously on ASA but according to the d/c summary this was to be held -ASA was on his med rec but this is again being held -Rate control with metoprolol  DM -He appears to be only on SSI -Will continue  HTN -In the setting of SDH, BP goal is 140 -Continue Norvasc and Lopressor, per neurology  H/o Proteus bacteremia -Present during recent hospitalization, 1/2 cultures which were pansensitive -Remains on Cipro, as above -Repeat blood cultures from 9/21 negative -Recheck blood cultures now -Urology deferred stone removal due to bacteremia; this may be required in the future once his more acute issues settle    Note: This patient has been tested and is negative for the novel coronavirus COVID-19.  DVT prophylaxis:   SCDs Code Status:  Full - confirmed with patient/family Family Communication: Wife was present throughout  evaluation  Disposition Plan:  He may benefit from CIR at the time of discharge, although he is not ready for consultation yet at this time Consults called: Neurology; neurosurgery; nutrition/ST/PT/OT  Admission status: Admit - It is my clinical opinion that admission to INPATIENT is reasonable and necessary because of the expectation that this patient will require hospital care that crosses at least 2 midnights to treat this condition based on the medical complexity of the problems presented.  Given the aforementioned information, the predictability of an adverse outcome is felt to be significant.    Karmen Bongo MD [facility]   How to contact the [facility] Attending or Consulting provider [place] or covering provider during after hours [place], for this patient?  1. Check the care team in [name] and look for a) attending/consulting [facility] provider listed and b) the [facility] team listed 2. Log into [email or web address] and use [place]'s universal password to access. If you do not have the password, please contact the hospital operator. 3. Locate the [name] provider you are looking for under [facility] and page to a number that you can be directly reached. 4. If you still have difficulty reaching the provider, please page the [facility] (Director on Call) for the Hospitalists listed on amion for assistance.   06/09/2019, 4:09 PM

## 2019-06-09 NOTE — ED Notes (Signed)
Pt arrives as code stroke from home due to wife feeling since 0800 he has been having difficulty with speech- weaker on the right than normal. Pt recently diagnosed with subdermal hematoma at [place]. Pt is alert and 0x4 on arrival.

## 2019-06-10 ENCOUNTER — Telehealth: Payer: Self-pay | Admitting: Neurology

## 2019-06-10 DIAGNOSIS — J690 Pneumonitis due to inhalation of food and vomit: Secondary | ICD-10-CM

## 2019-06-10 DIAGNOSIS — S065XAA Traumatic subdural hemorrhage with loss of consciousness status unknown, initial encounter: Secondary | ICD-10-CM | POA: Diagnosis present

## 2019-06-10 DIAGNOSIS — I10 Essential (primary) hypertension: Secondary | ICD-10-CM

## 2019-06-10 DIAGNOSIS — S065X9A Traumatic subdural hemorrhage with loss of consciousness of unspecified duration, initial encounter: Secondary | ICD-10-CM

## 2019-06-10 DIAGNOSIS — I482 Chronic atrial fibrillation, unspecified: Secondary | ICD-10-CM

## 2019-06-10 DIAGNOSIS — N186 End stage renal disease: Secondary | ICD-10-CM

## 2019-06-10 DIAGNOSIS — C9000 Multiple myeloma not having achieved remission: Secondary | ICD-10-CM

## 2019-06-10 DIAGNOSIS — Z992 Dependence on renal dialysis: Secondary | ICD-10-CM

## 2019-06-10 LAB — GLUCOSE, CAPILLARY
Glucose-Capillary: 83 mg/dL (ref 70–99)
Glucose-Capillary: 106 mg/dL — ABNORMAL HIGH (ref 70–99)
Glucose-Capillary: 114 mg/dL — ABNORMAL HIGH (ref 70–99)
Glucose-Capillary: 108 mg/dL — ABNORMAL HIGH (ref 70–99)

## 2019-06-10 LAB — CBC
Hemoglobin: 8.8 g/dL — ABNORMAL LOW (ref 13.0–17.0)
Platelets: 281 10*3/uL (ref 150–400)
WBC: 4.5 10*3/uL (ref 4.0–10.5)

## 2019-06-10 LAB — RENAL FUNCTION PANEL
Albumin: 3.2 g/dL — ABNORMAL LOW (ref 3.5–5.0)
Anion gap: 12 (ref 5–15)
BUN: 32 mg/dL — ABNORMAL HIGH (ref 6–20)
CO2: 24 mmol/L (ref 22–32)
Calcium: 8.6 mg/dL — ABNORMAL LOW (ref 8.9–10.3)
Chloride: 102 mmol/L (ref 98–111)
Creatinine, Ser: 12.44 mg/dL — ABNORMAL HIGH (ref 0.61–1.24)
GFR calc Af Amer: 5 mL/min — ABNORMAL LOW (ref >60)
GFR calc non Af Amer: 4 mL/min — ABNORMAL LOW (ref >60)
Glucose, Bld: 110 mg/dL — ABNORMAL HIGH (ref 70–99)
Phosphorus: 2.3 mg/dL — ABNORMAL LOW (ref 2.5–4.6)
Potassium: 4.3 mmol/L (ref 3.5–5.1)
Sodium: 138 mmol/L (ref 135–145)

## 2019-06-10 LAB — HIV ANTIBODY (ROUTINE TESTING W REFLEX): HIV Screen 4th Generation wRfx: NONREACTIVE

## 2019-06-10 LAB — CBG MONITORING, ED: Glucose-Capillary: 109 mg/dL — ABNORMAL HIGH (ref 70–99)

## 2019-06-10 MED ORDER — LACOSAMIDE 50 MG PO TABS: 150.0000 mg | ORAL_TABLET | ORAL | Status: DC | PRN

## 2019-06-10 MED ORDER — LACOSAMIDE 50 MG PO TABS
150.0000 mg | ORAL_TABLET | ORAL | Status: DC
  Administered 2019-06-10 – 2019-06-13 (×3): 150 mg via ORAL
  Filled 2019-06-10 (×2): qty 3

## 2019-06-10 MED ORDER — CLONAZEPAM 0.5 MG PO TABS
0.5000 mg | ORAL_TABLET | ORAL | Status: DC
  Administered 2019-06-10 – 2019-06-13 (×2): 0.5 mg via ORAL
  Filled 2019-06-10 (×2): qty 1

## 2019-06-10 MED ORDER — CLONAZEPAM 0.5 MG PO TABS
ORAL_TABLET | ORAL | Status: AC
  Administered 2019-06-10: 0.5 mg via ORAL
  Filled 2019-06-10: qty 1

## 2019-06-10 MED ORDER — PRO-STAT SUGAR FREE PO LIQD
30.0000 mL | Freq: Three times a day (TID) | ORAL | Status: DC
  Administered 2019-06-10 – 2019-06-13 (×11): 30 mL via ORAL
  Filled 2019-06-10 (×10): qty 30

## 2019-06-10 MED ORDER — CLONAZEPAM 0.5 MG PO TABS: 0.5000 mg | ORAL_TABLET | ORAL | Status: DC | PRN

## 2019-06-10 MED ORDER — LACOSAMIDE 50 MG PO TABS
150.0000 mg | ORAL_TABLET | Freq: Two times a day (BID) | ORAL | Status: DC
  Administered 2019-06-10 – 2019-06-14 (×8): 150 mg via ORAL
  Filled 2019-06-10 (×9): qty 3

## 2019-06-10 MED ORDER — LAMOTRIGINE 25 MG PO TABS
150.0000 mg | ORAL_TABLET | Freq: Two times a day (BID) | ORAL | Status: DC
  Administered 2019-06-10 – 2019-06-14 (×9): 150 mg via ORAL
  Filled 2019-06-10 (×6): qty 6
  Filled 2019-06-10: qty 1
  Filled 2019-06-10: qty 6
  Filled 2019-06-10: qty 1
  Filled 2019-06-10 (×2): qty 6

## 2019-06-10 NOTE — Progress Notes (Signed)
PT Cancellation Note  Patient Details Name: Isaiah [name] MRN: [id number] DOB: [birth year]   Cancelled Treatment:    Reason Eval/Treat Not Completed: Patient at procedure or test/unavailable Pt currently at HD. Will follow up as schedule allows.   Leighton Ruff, PT, DPT  Acute Rehabilitation Services  Pager: [phone] Office: [phone]  Rudean Hitt 06/10/2019, 10:28 AM

## 2019-06-10 NOTE — Progress Notes (Signed)
PROGRESS NOTE    [name]  CHE:[id number] DOB: June 02, 1966 DOA: 06/09/2019 PCP: [name], MD   Brief Narrative:  HPI per Dr. Vergia Alcon is a 53 y.o. male with medical history significant of CVA; seizure d/o; OSA on CPAP; PE on Coumadin; multiple myeloma; HTN; ESRD on MWF HD; DM; and afib presenting with R facial droop, R sided weakness, and slurred speech this AM.  He is unable to receive tPA due to recent SDH.  After he got home from the hospital, he was trying to get up the stairs and around the house.  He walked with a walker some but mostly sat in a chair.  He was able to swallow.  Everything was good while sleeping.  About 7AM yersterday, he took his medicine and kind of threw it up.  He noticed weakness, difficulty getting up onto to walker.  He was took weak to go to HD.  He was able to tolerate breakfast, lunch, dinner. He was able to walk but was fatigued.  This AM, he got up and took his medicine.  An hour later, he called his wife and was vomiting.  He was unable to lift his legs.  His right LE was weaker than she had ever seen it.  His speech was worse than normal - generally understandable speech but more slurred than normal. His R facial droop was more than usual.  He was admitted at [facility] from 9/17-29 for planned autologous stem cell transplant for multiple myeloma.  However, he had seizure activity following HD on 9/18, and head CT revealed an acute on chronic R-sided SDH.  He was also found to have Proteus mirabilis bacteremia (pan-sensitive), which was treated with Zosyn.  He was found to have B renal stones, but urology deferred removal in the setting of bacteremia.  He was transitioned to Cipro for continuous prophylactic therapy.  He was also noted to have aspiration PNA.  For his seizures, he was changed to Vimpat and Depabote with Klonopin after HD.  Given his multitude of medical problems, stem cell transplantation was held.   ED Course: Discharged from [facility]  Tuesday - multiple myeloma, Proteus bacteremia, seizures, SDH. He got better and was discharged Tuesday. Doing ok until yesterday - worse on the right, trouble with words. Dr. Leonel Ramsay saw him. ?change in CT. Dr. Ellene Route looked at the CT - probably needs overnight evaluation, would consider evacuation.  Assessment & Plan:   Principal Problem:   Acute on chronic intracranial subdural hematoma (HCC) Active Problems:   Multiple myeloma not having achieved remission (HCC)   ESRD on hemodialysis ([place])   Essential hypertension   DM type 2 (diabetes mellitus, type 2) (HCC)   Seizure disorder (HCC)   Aspiration pneumonia (HCC)   History of bacteremia   Atrial fibrillation, chronic (HCC)  1 acute/subacute/chronic SDH Questionable etiology.  Patient with prior to admission and found to have an acute on chronic right-sided SDH.  Patient had presented with worsening neurological symptoms on day of admission with concern for progressive disease/new CVA.  During prior hospitalization at [facility] patient was noted to have a 2 mm midline shift.  Head CT done on admission with increased midline shift to 4 mm.  MRI head done with right-sided subdural hematoma appearing predominantly subacute although there may be an area of more recent hemorrhage posteriorly based on high density on CT.  Fluid measures up to 14 mm in thickness in the parietal lobe and 9 mm in thickness in the  frontal lobe, 5 mm midline shift to the left.  Negative for any acute infarct.  Generalized atrophy with chronic ischemia.  Patient still with some right-sided weakness with slight improvement.  Some improvement with dysarthria.  May need repeat head CT in 48 hours.  PT/OT/SLP consulted.  Neurology and neurosurgery following and appreciate their input and recommendations.  2.  End-stage renal disease on hemodialysis Patient seen by nephrology and currently undergoing hemodialysis.  Continue PhosLo, Sensipar, Rena-Vite, Klonopin.  Per  nephrology.  3.  Recent aspiration pneumonia Noted on CT scan 916.  Patient was on IV Zosyn and transitioned to ciprofloxacin on 06/02/2019.  Currently on oral ciprofloxacin which we will continue for now.  4.  Seizure d/o Stable.  No seizures noted.  Continue home regimen of Vimpat and Lamictal.  5.  Paroxysmal atrial fibrillation Patient with history of A. fib and PFO.  Status post IVC filter.  Patient not on anticoagulation likely not a candidate secondary to problem #1.  Patient was on aspirin which we will hold for now secondary to problem #1.  Continue metoprolol for rate control.  6.  Diabetes mellitus CBG of 110.  Check a hemoglobin A1c.  Continue sliding scale insulin.  7.  Hypertension Stable.  Continue Norvasc, metoprolol.  8.  History of Proteus bacteremia During recent hospitalization patient noted to have a Proteus bacteremia with 1 out of 2 cultures which were pansensitive.  Patient currently on Cipro.  Repeat blood cultures from 05/30/2019-.  Repeat blood cultures ordered and pending.  Urology deferred stone removal due to bacteremia will need outpatient follow-up with urology.  9.  Multiple myeloma Patient previously followed for MGUS but bone marrow biopsy in 2018 showed almost 20% plasma cell infiltration.  Skeletal survey negative in 10/2017.  Treatment deferred in 12/2017 due to lack of symptoms.  8/19 bone marrow biopsy with 25% abnormal plasma cells, FISH with IgG kappa.  Skeletal survey remain negative.  Patient recently admitted to [place] stem cell transplant but delayed due to other medical problems that occurred while hospitalized.   DVT prophylaxis: SCDs Code Status: Full Family Communication: Updated patient.  No family at bedside. Disposition Plan: Likely home when clinically improved and when okay with neurosurgery and neurology.   Consultants:   Neurosurgery: Dr. Ellene Route 06/09/2019  Neurology: Dr. Leonel Ramsay 06/09/2019  Procedures:   MRI brain  06/09/2019  CT head 06/09/2019  Chest x-ray 06/09/2019  Antimicrobials:   Ciprofloxacin started prior to admission.   Subjective: Seen in hemodialysis.  Patient states slight improvement with right-sided weakness however still feeling weak and not at baseline.  Some improvement with dysarthric speech.  Objective: Vitals:   06/10/19 1000 06/10/19 1030 06/10/19 1100 06/10/19 1130  BP: (!) 150/80 (!) 142/67 132/81 (!) 150/75  Pulse: 76 74 78 77  Resp:      Temp:      TempSrc:      SpO2:      Weight:       No intake or output data in the 24 hours ending 06/10/19 1137 Filed Weights   06/10/19 0130 06/10/19 0721  Weight: 84.7 kg 86.2 kg    Examination:  General exam: NAD Respiratory system: Clear to auscultation anterior lung fields. Respiratory effort normal. Cardiovascular system: S1 & S2 heard, RRR. No JVD, murmurs, rubs, gallops or clicks. No pedal edema. Gastrointestinal system: Abdomen is nondistended, soft and nontender. No organomegaly or masses felt. Normal bowel sounds heard. Central nervous system: Alert and oriented.  Right facial weakness.  Right-sided weakness.   Extremities: Right-sided weakness.  Skin: No rashes, lesions or ulcers Psychiatry: Judgement and insight appear normal. Mood & affect appropriate.     Data Reviewed: I have personally reviewed following labs and imaging studies  CBC: Recent Labs  Lab 06/09/19 1047 06/09/19 1100 06/10/19 0810  WBC 8.9  --  4.5  NEUTROABS 6.4  --   --   HGB 9.5* 10.9* 8.8*  HCT 26.0* 32.0* RESULTS UNAVAILABLE DUE TO INTERFERING SUBSTANCE  MCV 94.2  --  RESULTS UNAVAILABLE DUE TO INTERFERING SUBSTANCE  PLT 282  --  545   Basic Metabolic Panel: Recent Labs  Lab 06/09/19 1047 06/09/19 1100 06/10/19 0810  NA 139 138 138  K 4.7 4.8 4.3  CL 101 103 102  CO2 21*  --  24  GLUCOSE 119* 118* 110*  BUN 32* 33* 32*  CREATININE 13.08* 12.90* 12.44*  CALCIUM 8.9  --  8.6*  PHOS  --   --  2.3*   GFR: CrCl  cannot be calculated (Unknown ideal weight.). Liver Function Tests: Recent Labs  Lab 06/09/19 1047 06/10/19 0810  AST 18  --   ALT 9  --   ALKPHOS 78  --   BILITOT 0.7  --   PROT 6.3*  --   ALBUMIN 3.4* 3.2*   Recent Labs  Lab 06/09/19 1047  LIPASE 30   No results for input(s): AMMONIA in the last 168 hours. Coagulation Profile: Recent Labs  Lab 06/09/19 1047  INR 1.2   Cardiac Enzymes: No results for input(s): CKTOTAL, CKMB, CKMBINDEX, TROPONINI in the last 168 hours. BNP (last 3 results) No results for input(s): PROBNP in the last 8760 hours. HbA1C: No results for input(s): HGBA1C in the last 72 hours. CBG: Recent Labs  Lab 06/09/19 1001 06/09/19 1707 06/10/19 0043 06/10/19 0624  GLUCAP 124* 101* 109* 83   Lipid Profile: No results for input(s): CHOL, HDL, LDLCALC, TRIG, CHOLHDL, LDLDIRECT in the last 72 hours. Thyroid Function Tests: No results for input(s): TSH, T4TOTAL, FREET4, T3FREE, THYROIDAB in the last 72 hours. Anemia Panel: No results for input(s): VITAMINB12, FOLATE, FERRITIN, TIBC, IRON, RETICCTPCT in the last 72 hours. Sepsis Labs: Recent Labs  Lab 06/09/19 1414  LATICACIDVEN 0.8    Recent Results (from the past 240 hour(s))  SARS Coronavirus 2 [facility] order, Performed in [facility] lab) Nasopharyngeal Nasopharyngeal Swab     Status: None   Collection Time: 06/09/19 10:47 AM   Specimen: Nasopharyngeal Swab  Result Value Ref Range Status   SARS Coronavirus 2 NEGATIVE NEGATIVE Final    Comment: (NOTE) If result is NEGATIVE SARS-CoV-2 target nucleic acids are NOT DETECTED. The SARS-CoV-2 RNA is generally detectable in upper and lower  respiratory specimens during the acute phase of infection. The lowest  concentration of SARS-CoV-2 viral copies this assay can detect is 250  copies / mL. A negative result does not preclude SARS-CoV-2 infection  and should not be used as the sole basis for treatment or other  patient management  decisions.  A negative result may occur with  improper specimen collection / handling, submission of specimen other  than nasopharyngeal swab, presence of viral mutation(s) within the  areas targeted by this assay, and inadequate number of viral copies  (<250 copies / mL). A negative result must be combined with clinical  observations, patient history, and epidemiological information. If result is POSITIVE SARS-CoV-2 target nucleic acids are DETECTED. The SARS-CoV-2 RNA is generally detectable in upper and lower  respiratory specimens dur ing the acute phase of infection.  Positive  results are indicative of active infection with SARS-CoV-2.  Clinical  correlation with patient history and other diagnostic information is  necessary to determine patient infection status.  Positive results do  not rule out bacterial infection or co-infection with other viruses. If result is PRESUMPTIVE POSTIVE SARS-CoV-2 nucleic acids MAY BE PRESENT.   A presumptive positive result was obtained on the submitted specimen  and confirmed on repeat testing.  While 2019 novel coronavirus  (SARS-CoV-2) nucleic acids may be present in the submitted sample  additional confirmatory testing may be necessary for epidemiological  and / or clinical management purposes  to differentiate between  SARS-CoV-2 and other Sarbecovirus currently known to infect humans.  If clinically indicated additional testing with an alternate test  methodology [phone]) is advised. The SARS-CoV-2 RNA is generally  detectable in upper and lower respiratory sp ecimens during the acute  phase of infection. The expected result is Negative. Fact Sheet for Patients:  StrictlyIdeas.no Fact Sheet for Healthcare Providers: BankingDealers.co.za This test is not yet approved or cleared by the [place] FDA and has been authorized for detection and/or diagnosis of SARS-CoV-2 by FDA under an  Emergency Use Authorization (EUA).  This EUA will remain in effect (meaning this test can be used) for the duration of the COVID-19 declaration under Section 564(b)(1) of the Act, 21 U.S.C. section 360bbb-3(b)(1), unless the authorization is terminated or revoked sooner. Performed at [facility], [place] [address] [place], [place] [address]   Blood culture (routine x 2)     Status: None (Preliminary result)   Collection Time: 06/09/19 10:47 AM   Specimen: BLOOD  Result Value Ref Range Status   Specimen Description BLOOD UNKNOWN  Final   Special Requests   Final    BOTTLES DRAWN AEROBIC AND ANAEROBIC Blood Culture results may not be optimal due to an inadequate volume of blood received in culture bottles   Culture   Final    NO GROWTH < 24 HOURS Performed at [facility], [place] [address], [place], [place] [address]    Report Status PENDING  Incomplete         Radiology Studies: Mr Brain Wo Contrast  Result Date: 06/09/2019 CLINICAL DATA:  Multiple myeloma, sepsis, diabetes. Subdural hemorrhage on CT today EXAM: MRI HEAD WITHOUT CONTRAST TECHNIQUE: Multiplanar, multiecho pulse sequences of the brain and surrounding structures were obtained without intravenous contrast. COMPARISON:  CT head 06/09/2019 FINDINGS: Brain: Subdural hematoma on the right has complex signal characteristics compatible with subacute to chronic blood. Based on CT, there may be a small amount of more recent hemorrhage posteriorly. There are multiple septations within the fluid. The subdural fluid measures approximately 14 mm in thickness in the parietal lobe and approximately 9 mm in thickness in the frontal lobe. There is mass-effect on the right hemisphere with 5 mm midline shift to the left. Ventricle size normal.  Negative for acute infarct Chronic hemorrhagic infarct left posterior frontal lobe. Chronic hemorrhagic infarct right posterior temporal/occipital lobe. Moderate atrophy. Vascular: Normal  arterial flow voids Skull and upper cervical spine: No focal bony lesion. Sinuses/Orbits: Mucosal edema paranasal sinuses. Cyst left maxillary sinus. Negative orbit. Other: None IMPRESSION: Right-sided subdural hematoma appears predominantly subacute although there may be an area of more recent hemorrhage posteriorly based on high density on CT. Fluid measures up to 14 mm in thickness in the parietal lobe and 9 mm in thickness in the  frontal lobe. 5 mm midline shift to the left. Negative for acute infarct Generalized atrophy with chronic ischemia. Electronically Signed   By: Franchot Gallo M.D.   On: 06/09/2019 16:58   Dg Chest Port 1 View  Result Date: 06/09/2019 CLINICAL DATA:  Altered mental status EXAM: PORTABLE CHEST 1 VIEW COMPARISON:  01/06/2016 FINDINGS: The heart size and mediastinal contours are within normal limits. Both lungs are clear. The visualized skeletal structures are unremarkable. IMPRESSION: No acute cardiopulmonary findings. Electronically Signed   By: Davina Poke M.D.   On: 06/09/2019 10:50   Ct Head Code Stroke Wo Contrast  Result Date: 06/09/2019 CLINICAL DATA:  Code stroke. Focal neuro deficit, less than 6 hours, stroke suspected. Additional history provided: Last known well 0 800, right-sided weakness, slurred speech, right facial droop. EXAM: CT HEAD WITHOUT CONTRAST TECHNIQUE: Contiguous axial images were obtained from the base of the skull through the vertex without intravenous contrast. COMPARISON:  Brain MRI 01/07/2016 FINDINGS: Brain: There is a mixed density holohemispheric subdural collection on the right containing internal foci of hyperdense acute/subacute blood products. This measures up to 1.5 cm in greatest thickness. There is mass effect upon the underlying right cerebral hemisphere. 4 mm leftward midline shift. No new cortical infarct is identified. Multiple redemonstrated chronic infarcts as follows. Moderate-sized chronic infarct within the left frontal lobe.  Small chronic infarct within the right frontal lobe. Small chronic infarct within the right temporal occipital lobe. Chronic bilateral cerebellar lacunar infarcts. Vascular: No hyperdense vessel. Atherosclerotic calcification of the carotid artery siphons and vertebrobasilar system. Skull: No calvarial fracture. Sinuses/Orbits: Visualized orbits demonstrate no acute abnormality. Partially imaged mucous retention cyst within the inferior left maxillary sinus. Mild ethmoid sinus mucosal thickening. No significant mastoid effusion. Left suboccipital scalp sebaceous cyst. These results were called by telephone at the time of interpretation on 06/09/2019 at 10:30 am to provider Dr. Leonel Ramsay, who verbally acknowledged these results. ASPECTS [facility] Stroke Program Early CT Score) - Ganglionic level infarction (caudate, lentiform nuclei, internal capsule, insula, M1-M3 cortex): 7 - Supraganglionic infarction (M4-M6 cortex): 3 Total score (0-10 with 10 being normal): 10 (when discounting chronic infarcts) IMPRESSION: 1. Mixed density right holohemispheric subdural collection containing internal foci of acute/subacute blood products, measuring up to 1.5 cm in thickness. Mass effect upon the underlying right cerebral hemisphere. 4 mm leftward midline shift. 2. No new cortical infarct is identified. Redemonstrated chronic infarcts within the left frontal lobe, right frontal lobe, right temporal occipital lobes and bilateral cerebellar hemispheres as described. Electronically Signed   By: Kellie Simmering   On: 06/09/2019 10:42        Scheduled Meds:   stroke: mapping our early stages of recovery book   Does not apply Once   acyclovir  400 mg Oral QHS   amLODipine  5 mg Oral Daily   calcium acetate  2,001 mg Oral TID WC   calcium carbonate  2 tablet Oral QHS   Chlorhexidine Gluconate Cloth  6 each Topical Q0600   cinacalcet  60 mg Oral QPM   ciprofloxacin  500 mg Oral QPM   clonazePAM  0.5 mg Oral Q  M,W,F-HD   fluticasone furoate-vilanterol  1 puff Inhalation Daily   insulin aspart  0-5 Units Subcutaneous QHS   insulin aspart  0-9 Units Subcutaneous TID WC   lacosamide  150 mg Oral BID   lacosamide  150 mg Oral Q M,W,F-HD   lamoTRIgine  150 mg Oral BID   metoprolol tartrate  12.5 mg Oral  BID   multivitamin  1 tablet Oral QHS   pantoprazole  40 mg Oral Daily   Continuous Infusions:   LOS: 1 day    Time spent: 35 minutes    Irine Seal, MD [facility]  If 7PM-7AM, please contact night-coverage [email or web address] 06/10/2019, 11:37 AM

## 2019-06-10 NOTE — Progress Notes (Signed)
OT Cancellation Note  Patient Details Name: Isaiah [name] MRN: [id number] DOB: 13-Jan-1966   Cancelled Treatment:    Reason Eval/Treat Not Completed: Patient at procedure or test/ unavailable.  Pt in HD.  Will reattempt.  Lucille Passy, OTR/L Acute Rehabilitation Services Pager [phone] Office [phone]   Lucille Passy M 06/10/2019, 10:54 AM

## 2019-06-10 NOTE — Telephone Encounter (Signed)
Wife had to call [facility] for him and he is at [facility]- someone from Neuro may or may not call Dr. Delice Lesch but she did let them know Dr. Delice Lesch is his DR and they moving him to the neuro floor. Thanks!

## 2019-06-10 NOTE — Progress Notes (Signed)
Subjective: Seems more awake today. States he feels back to baseline.   Per wife, his right-sided is been worse since he was admitted to [facility] earlier this month.  Exam: Vitals:   06/10/19 1130 06/10/19 1206  BP: (!) 150/75 (!) 141/76  Pulse: 77 78  Resp:  17  Temp:  98.4 F (36.9 C)  SpO2:  97%   Gen: In bed, NAD Resp: non-labored breathing, no acute distress Abd: soft, nt  Neuro: MS: Mild perseveration, but improved GT:XMIWO, alert, mildly dysconjugate gaze(baseline "at times" per pt)  Motor: 4/5 in the right arm and leg. 5/5 on the left.   Impression: 53 yo M with worsening of right hemiparesis in the setting of seizure, febrile hematoma that occurred earlier this month.  He was already evaluated at [facility] for this.  I suspect that his mild worsening yesterday was in the setting of physiological stressor with nausea/vomiting.  I am unsure if the hematoma really has changed much, obtaining outside images for neurosurgery to compare would be useful.  I will defer to neurosurgery how much obtaining these records will actually change management.  With negative MRI, and no evidence of seizure activity, I have low suspicion for a primary neurological cause.    Recommendations: 1) continue home antiepileptics 2) I have placed an order to obtain images from [facility], apparently per radiology, these can be pushed over to the medical records department.  3)  Call if neurology can be of further assistance.  Roland Rack, MD [facility] [phone]  If 7pm- 7am, please page neurology on call as listed in [place].

## 2019-06-10 NOTE — Progress Notes (Signed)
SLP Cancellation Note   Patient Details Patient Name: Isaiah [name] MRN: [id number] DOB: November 22, 1965  Reason for Cancelled Eval/Treatment: Pt is currently OTF at hemodialysis.  ST will continue to follow up as schedule allows.    Bretta Bang, M.S., [place] Acute Rehabilitation Services Office: [phone]    .

## 2019-06-10 NOTE — Progress Notes (Signed)
Initial Nutrition Assessment  DOCUMENTATION CODES:   Not applicable  INTERVENTION:   -30 ml Prostat TID, each supplement provides 100 kcals and 15 grams protein -Renal MVI daily  NUTRITION DIAGNOSIS:   Increased nutrient needs related to chronic illness(ESRD on HD) as evidenced by estimated needs.  GOAL:   Patient will meet greater than or equal to 90% of their needs  MONITOR:   PO intake, Supplement acceptance, Labs, Weight trends, Skin, I & O's  REASON FOR ASSESSMENT:   Consult Assessment of nutrition requirement/status  ASSESSMENT:   Isaiah [name] is a 53 y.o. male with medical history significant of CVA; seizure d/o; OSA on CPAP; PE on Coumadin; multiple myeloma; HTN; ESRD on MWF HD; DM; and afib presenting with R facial droop, R sided weakness, and slurred speech this AM.  He is unable to receive tPA due to recent SDH.  After he got home from the hospital, he was trying to get up the stairs and around the house.  He walked with a walker some but mostly sat in a chair.  He was able to swallow.  Everything was good while sleeping.  About 7AM yersterday, he took his medicine and kind of threw it up.  He noticed weakness, difficulty getting up onto to walker.  He was took weak to go to HD.  He was able to tolerate breakfast, lunch, dinner. He was able to walk but was fatigued.  This AM, he got up and took his medicine.  An hour later, he called his wife and was vomiting.  He was unable to lift his legs.  His right LE was weaker than she had ever seen it.  His speech was worse than normal - generally understandable speech but more slurred than normal. His R facial droop was more than usual.  Pt admitted with acute on chronic SDH.   Attempted to speak with pt, however, pt out of room at time of visit. No family present to provide further history.   No meal completion records or weight history available to assess at this time.   Per nephrology notes EDW 92 kg.   Pt with increased  nutritional needs due to HD and would benefit from addition of nutritional supplements.   Labs reviewed: CBGS: 83-109 (inpatient orders for glycemic control are 0-5 units insulin aspart q HS and 0-9 units insulin aspart TID with meals).   Diet Order:   Diet Order            Diet heart healthy/carb modified Fluid consistency: Thin  Diet effective ____              EDUCATION NEEDS:   No education needs have been identified at this time  Skin:  Skin Assessment: Reviewed RN Assessment  Last BM:  Unknown  Height:   Ht Readings from Last 1 Encounters:  No data found for Ht    Weight:   Wt Readings from Last 1 Encounters:  06/10/19 83.6 kg    Ideal Body Weight:     BMI:  There is no height or weight on file to calculate BMI.  Estimated Nutritional Needs:   Kcal:  2000-2200  Protein:  105-120 grams  Fluid:  1000 ml + UOP    Isaiah [name] A. Jimmye Norman, RD, LDN, [place] Registered Dietitian II Certified Diabetes Care and Education Specialist Pager: [phone] After hours Pager: [phone]

## 2019-06-10 NOTE — Progress Notes (Signed)
Isaiah [name] Progress Note  Subjective: has no new c/o's.   Vitals:   06/10/19 1030 06/10/19 1100 06/10/19 1130 06/10/19 1206  BP: (!) 142/67 132/81 (!) 150/75 (!) 141/76  Pulse: 74 78 77 78  Resp:    17  Temp:    98.4 F (36.9 C)  TempSrc:    Oral  SpO2:    97%  Weight:    83.6 kg    Inpatient medications: .  stroke: mapping our early stages of recovery book   Does not apply Once  . acyclovir  400 mg Oral QHS  . amLODipine  5 mg Oral Daily  . calcium acetate  2,001 mg Oral TID WC  . calcium carbonate  2 tablet Oral QHS  . Chlorhexidine Gluconate Cloth  6 each Topical Q0600  . cinacalcet  60 mg Oral QPM  . ciprofloxacin  500 mg Oral QPM  . clonazePAM  0.5 mg Oral Q M,W,F-HD  . fluticasone furoate-vilanterol  1 puff Inhalation Daily  . insulin aspart  0-5 Units Subcutaneous QHS  . insulin aspart  0-9 Units Subcutaneous TID WC  . lacosamide  150 mg Oral BID  . lacosamide  150 mg Oral Q M,W,F-HD  . lamoTRIgine  150 mg Oral BID  . metoprolol tartrate  12.5 mg Oral BID  . multivitamin  1 tablet Oral QHS  . pantoprazole  40 mg Oral Daily    acetaminophen **OR** acetaminophen (TYLENOL) oral liquid 160 mg/5 mL **OR** acetaminophen, calcium carbonate (dosed in mg elemental calcium), camphor-menthol **AND** hydrOXYzine, docusate sodium, feeding supplement (NEPRO CARB STEADY), prochlorperazine, senna-docusate, sorbitol, zolpidem    Exam: Gen calm, , is awake and alert, following commands No jvd or bruits Chest clear bilat to bases RRR no MRG Abd soft ntnd no mass or ascites +bs Ext no LE or UE edema Neuro is alert, Ox 3 , R facial droop and R arm a bit weak at baseline    Home meds:  - amlodipine 5 / metoprolol 12.5 bid  - pantoprazole 40 / cinacalcet 60 hs  - aspirin 81 qd  - acyclovir 400 hs  - clonazepam 0.5 after each HD MWF/ lacosamide 150 bid + 147m after each HD MWF/ lamotrigine 150 bid  - fluticasone- vilanterol 1 qd  - insulin lispro SSI  -  prn's/ vitamins/ supplements     Outpt HD: MWF   4h  400/800   92kg   2/2 bath   Hep 2600 (NO HEP here due to SDH)  mircera 225 q2wks, last ?  calcitriol 1.75 ug tiw  venofer 50 /wk     Assessment/ Plan: 1. AMS/ acute on chronic SDH - per primary/ neuro/ NSurg 2. ESRD - on HD MWF. HD today. NO HEPARIN due to subdural bleed.  3. HTN/ vol - cont meds. No sig vol excess on exam. BP's ok.  4. Seizures - on 3 meds. Gets extra clonazepam & Vimpat after each HD as well 5. DM on insulin 6. Mult myeloma - recent stem-cell Tx plan was aborted [facility] SD[facility] 7. Anemia ckd - Hb 10.9, cont to follow       Isaiah [name] 06/10/2019, 1:06 PM  Iron/TIBC/Ferritin/ %Sat No results found for: IRON, TIBC, FERRITIN, IRONPCTSAT Recent Labs  Lab 06/09/19 1047  06/10/19 0810  NA 139   < > 138  K 4.7   < > 4.3  CL 101   < > 102  CO2 21*  --  24  GLUCOSE 119*   < > 110*  BUN 32*   < > 32*  CREATININE 13.08*   < > 12.44*  CALCIUM 8.9  --  8.6*  PHOS  --   --  2.3*  ALBUMIN 3.4*  --  3.2*  INR 1.2  --   --    < > = values in this interval not displayed.   Recent Labs  Lab 06/09/19 1047  AST 18  ALT 9  ALKPHOS 78  BILITOT 0.7  PROT 6.3*   Recent Labs  Lab 06/10/19 0810  WBC 4.5  HGB 8.8*  HCT RESULTS UNAVAILABLE DUE TO INTERFERING SUBSTANCE  PLT 281

## 2019-06-11 LAB — CBC WITH DIFFERENTIAL/PLATELET
Abs Immature Granulocytes: 0.02 10*3/uL (ref 0.00–0.07)
Basophils Absolute: 0.1 10*3/uL (ref 0.0–0.1)
Basophils Relative: 3 %
Eosinophils Absolute: 0.4 10*3/uL (ref 0.0–0.5)
Eosinophils Relative: 8 %
Hemoglobin: 9 g/dL — ABNORMAL LOW (ref 13.0–17.0)
Immature Granulocytes: 0 %
Lymphocytes Relative: 9 %
Lymphs Abs: 0.5 10*3/uL — ABNORMAL LOW (ref 0.7–4.0)
Monocytes Absolute: 1.5 10*3/uL — ABNORMAL HIGH (ref 0.1–1.0)
Monocytes Relative: 27 %
Neutro Abs: 3 10*3/uL (ref 1.7–7.7)
Neutrophils Relative %: 53 %
Platelets: 289 10*3/uL (ref 150–400)
WBC: 5.6 10*3/uL (ref 4.0–10.5)

## 2019-06-11 LAB — RENAL FUNCTION PANEL
Albumin: 3 g/dL — ABNORMAL LOW (ref 3.5–5.0)
Anion gap: 11 (ref 5–15)
BUN: 20 mg/dL (ref 6–20)
CO2: 28 mmol/L (ref 22–32)
Calcium: 8.3 mg/dL — ABNORMAL LOW (ref 8.9–10.3)
Chloride: 99 mmol/L (ref 98–111)
Creatinine, Ser: 8.56 mg/dL — ABNORMAL HIGH (ref 0.61–1.24)
GFR calc Af Amer: 7 mL/min — ABNORMAL LOW (ref >60)
GFR calc non Af Amer: 6 mL/min — ABNORMAL LOW (ref >60)
Glucose, Bld: 84 mg/dL (ref 70–99)
Phosphorus: 2.7 mg/dL (ref 2.5–4.6)
Potassium: 4 mmol/L (ref 3.5–5.1)
Sodium: 138 mmol/L (ref 135–145)

## 2019-06-11 LAB — GLUCOSE, CAPILLARY
Glucose-Capillary: 92 mg/dL (ref 70–99)
Glucose-Capillary: 144 mg/dL — ABNORMAL HIGH (ref 70–99)
Glucose-Capillary: 101 mg/dL — ABNORMAL HIGH (ref 70–99)
Glucose-Capillary: 132 mg/dL — ABNORMAL HIGH (ref 70–99)

## 2019-06-11 NOTE — Evaluation (Signed)
Speech Language Pathology Evaluation Patient Details Name: Isaiah [name] MRN: [id number] DOB: 1965-09-25 Today's Date: 06/11/2019 Time: 6962-9528 SLP Time Calculation (min) (ACUTE ONLY): 20 min  Problem List:  Patient Active Problem List   Diagnosis Date Noted  . SDH (subdural hematoma) ([place])   . Acute on chronic intracranial subdural hematoma ([facility]) 06/09/2019  . Multiple myeloma not having achieved remission ([place]) 06/09/2019  . ESRD on hemodialysis ([place]) 06/09/2019  . Essential hypertension 06/09/2019  . DM type 2 (diabetes mellitus, type 2) ([place]) 06/09/2019  . Seizure disorder ([place]) 06/09/2019  . Aspiration pneumonia ([place]) 06/09/2019  . History of bacteremia 06/09/2019  . Atrial fibrillation, chronic ([place]) 06/09/2019   Past Medical History:  Past Medical History:  Diagnosis Date  . DM type 2 (diabetes mellitus, type 2) ([place]) 06/09/2019  . Essential hypertension 06/09/2019  . Multiple myeloma ([place])   . Seizure disorder ([place]) 06/09/2019  . Sepsis ([place])   . Subdural hematoma ([facility])    Past Surgical History: History reviewed. No pertinent surgical history. HPI:  Isaiah [name] is a 53 y.o. male with medical history significant of CVA; seizure d/o; OSA; PE; multiple myeloma; HTN; ESRD on MWF HD; DM; and afib presenting with R facial droop, R sided weakness, and slurred speech this AM.  He is unable to receive tPA due to recent SDH.He was admitted at [facility] from 9/17-29 for planned autologous stem cell transplant for multiple myeloma.  However, he had seizure activity following HD on 9/18, and head CT revealed an acute on chronic R-sided SDH.CT on 10/1 showed 5 mm midline shift to the left.   Assessment / Plan / Recommendation Clinical Impression  Patient presents with a mild dysarthria, mild expressive aphasia and questionable very mild motor apraxia which was inconsistently presenting. Patient has had speech therapy in the past and stated he was discharged two years ago. Patient's dysarthria  results in mild reduced intelligibility and he presents with a mild incidence of word finding errors during conversation. As patient is able to effectively communicate his needs and wants, he does not need Acute care SLP but would benefit from evaluation by inpatient rehab or outpatient to determine how close he is to baseline functioning.    SLP Assessment  SLP Recommendation/Assessment: All further Speech Lanaguage Pathology  needs can be addressed in the next venue of care SLP Visit Diagnosis: Dysarthria and anarthria (R47.1);Aphasia (R47.01)    Follow Up Recommendations  Outpatient SLP    Frequency and Duration   N/A        SLP Evaluation Cognition  Overall Cognitive Status: Within Functional Limits for tasks assessed Orientation Level: Oriented X4       Comprehension  Auditory Comprehension Overall Auditory Comprehension: Appears within functional limits for tasks assessed    Expression Expression Primary Mode of Expression: Verbal Verbal Expression Overall Verbal Expression: Impaired Initiation: No impairment Repetition: No impairment Naming: No impairment   Oral / Motor  Oral Motor/Sensory Function Overall Oral Motor/Sensory Function: Mild impairment Facial ROM: Reduced right Facial Symmetry: Abnormal symmetry right Facial Strength: Reduced right Lingual ROM: Within Functional Limits Lingual Symmetry: Within Functional Limits Lingual Strength: Within Functional Limits Motor Speech Overall Motor Speech: Impaired Respiration: Within functional limits Phonation: Normal Resonance: Within functional limits Articulation: Impaired Level of Impairment: Sentence Intelligibility: Intelligibility reduced Word: 75-100% accurate Phrase: 75-100% accurate Sentence: 50-74% accurate Conversation: 50-74% accurate Motor Planning: Impaired Level of Impairment: Phrase Motor Speech Errors: Aware;Inconsistent Effective Techniques: Slow rate;Pause   GO  Isaiah [name] 06/11/2019, 6:04 PM   Sonia Baller, MA, CCC-SLP Speech Therapy [place] [facility] Pager: [phone]

## 2019-06-11 NOTE — Progress Notes (Signed)
[facility] Progress Note  Subjective: has no new c/o's.   Vitals:   06/11/19 1000 06/11/19 1100 06/11/19 1200 06/11/19 1300  BP: (!) 151/78   130/85  Pulse:      Resp: 13 11 18  (!) 5  Temp:  (!) 97.4 F (36.3 C)    TempSrc:      SpO2: 100% 97% 97% 98%  Weight:        Inpatient medications: .  stroke: mapping our early stages of recovery book   Does not apply Once  . acyclovir  400 mg Oral QHS  . amLODipine  5 mg Oral Daily  . calcium acetate  2,001 mg Oral TID WC  . calcium carbonate  2 tablet Oral QHS  . Chlorhexidine Gluconate Cloth  6 each Topical Q0600  . cinacalcet  60 mg Oral QPM  . ciprofloxacin  500 mg Oral QPM  . clonazePAM  0.5 mg Oral Q M,W,F-HD  . feeding supplement (PRO-STAT SUGAR FREE 64)  30 mL Oral TID BM  . fluticasone furoate-vilanterol  1 puff Inhalation Daily  . insulin aspart  0-5 Units Subcutaneous QHS  . insulin aspart  0-9 Units Subcutaneous TID WC  . lacosamide  150 mg Oral BID  . lacosamide  150 mg Oral Q M,W,F-HD  . lamoTRIgine  150 mg Oral BID  . metoprolol tartrate  12.5 mg Oral BID  . multivitamin  1 tablet Oral QHS  . pantoprazole  40 mg Oral Daily    acetaminophen **OR** acetaminophen (TYLENOL) oral liquid 160 mg/5 mL **OR** acetaminophen, calcium carbonate (dosed in mg elemental calcium), camphor-menthol **AND** hydrOXYzine, docusate sodium, feeding supplement (NEPRO CARB STEADY), prochlorperazine, senna-docusate, sorbitol, zolpidem    Exam: Gen calm, , is awake and alert, following commands No jvd or bruits Chest clear bilat to bases RRR no MRG Abd soft ntnd no mass or ascites +bs Ext no LE or UE edema Neuro is alert, Ox 3 , R facial droop and R arm a bit weak at baseline    Home meds:  - amlodipine 5 / metoprolol 12.5 bid  - pantoprazole 40 / cinacalcet 60 hs  - aspirin 81 qd  - acyclovir 400 hs  - clonazepam 0.5 after each HD MWF/ lacosamide 150 bid + 143m after each HD MWF/ lamotrigine 150 bid  -  fluticasone- vilanterol 1 qd  - insulin lispro SSI  - prn's/ vitamins/ supplements     Outpt HD: MWF   4h  400/800   92kg   2/2 bath   Hep 2600 (NO HEP here due to SDH)  mircera 225 q2wks, last ?  calcitriol 1.75 ug tiw  venofer 50 /wk     Assessment/ Plan: 1. AMS/ acute on chronic SDH - per primary/ neuro/ NSurg 2. ESRD - on HD MWF. NO HEPARIN due to subdural bleed.  3. HTN/ vol - cont meds. No sig vol excess on exam. BP's ok. 8kg under dry wt, will need lowering of edw when dc'd.   4. Seizures - on 3 meds. Gets extra clonazepam & Vimpat after each HD as well 5. DM on insulin 6. Mult myeloma - recent stem-cell transplant plan was aborted due to SDH and bacteremia/ infection at [facility]  7. Anemia ckd - Hb 10.9, cont to follow   Rob Illyana Schorsch 06/11/2019, 2:05 PM  Iron/TIBC/Ferritin/ %Sat No results found for: IRON, TIBC, FERRITIN, IRONPCTSAT Recent Labs  Lab 06/09/19 1047  06/11/19 0928  NA 139   < > 138  K  4.7   < > 4.0  CL 101   < > 99  CO2 21*   < > 28  GLUCOSE 119*   < > 84  BUN 32*   < > 20  CREATININE 13.08*   < > 8.56*  CALCIUM 8.9   < > 8.3*  PHOS  --    < > 2.7  ALBUMIN 3.4*   < > 3.0*  INR 1.2  --   --    < > = values in this interval not displayed.   Recent Labs  Lab 06/09/19 1047  AST 18  ALT 9  ALKPHOS 78  BILITOT 0.7  PROT 6.3*   Recent Labs  Lab 06/11/19 0928  WBC 5.6  HGB 9.0*  HCT RESULTS UNAVAILABLE DUE TO INTERFERING SUBSTANCE  PLT 289

## 2019-06-11 NOTE — Evaluation (Signed)
Occupational Therapy Evaluation Patient Details Name: Isaiah [name] MRN: [id number] DOB: 05-11-1966 Today's Date: 06/11/2019    History of Present Illness Isaiah [name] is a 53 y.o. male with medical history significant of CVA; seizure d/o; OSA; PE; multiple myeloma; HTN; ESRD on MWF HD; DM; and afib presenting with R facial droop, R sided weakness, and slurred speech this AM.  He is unable to receive tPA due to recent SDH.He was admitted at [facility] from 9/17-29 for planned autologous stem cell transplant for multiple myeloma.  However, he had seizure activity following HD on 9/18, and head CT revealed an acute on chronic R-sided SDH.CT on 10/1 showed 5 mm midline shift to the left.   Clinical Impression   This 53 yo male admitted with above presents to acute OT with decreased balance, decreased mobility, decreased use of RUE, decreased vision, and decreased safety awareness all affecting his PLOF per his report of being Mod I with all basic ADLs and mobility. He will benefit from acute OT with follow up on CIR.    Follow Up Recommendations  CIR;Supervision/Assistance - 24 hour    Equipment Recommendations  Other (comment)(TBD at next venue)       Precautions / Restrictions Precautions Precautions: Fall Restrictions Weight Bearing Restrictions: No      Mobility Bed Mobility Overal bed mobility: Needs Assistance Bed Mobility: Supine to Sit     Supine to sit: Min guard     General bed mobility comments: Was able to come to EOB with use of rail and incr time  Transfers Overall transfer level: Needs assistance Equipment used: Rolling walker (2 wheeled) Transfers: Sit to/from Stand Sit to Stand: Min assist;+2 safety/equipment;From elevated surface         General transfer comment: Pt needed steadying assist to rise as he was leaning posteriorly iniitally.  Relies on RW for balance    Balance Overall balance assessment: Needs assistance Sitting-balance support: No upper  extremity supported;Feet supported Sitting balance-Leahy Scale: Fair   Postural control: Posterior lean Standing balance support: Bilateral upper extremity supported;During functional activity Standing balance-Leahy Scale: Poor Standing balance comment: relies on UE support on RW static and dynamic as well as at least +1 external support                           ADL either performed or assessed with clinical judgement   ADL Overall ADL's : Needs assistance/impaired Eating/Feeding: Modified independent;Sitting Eating/Feeding Details (indicate cue type and reason): in recliner, increased time due to slower movement of RUE Grooming: Minimal assistance;Sitting Grooming Details (indicate cue type and reason): in recliner, increased time due to slower movement of RUE Upper Body Bathing: Minimal assistance Upper Body Bathing Details (indicate cue type and reason): in recliner, increased time due to slower movement of RUE Lower Body Bathing: Moderate assistance Lower Body Bathing Details (indicate cue type and reason): min A sit<>stand but mod A for standing balance Upper Body Dressing : Minimal assistance;Sitting Upper Body Dressing Details (indicate cue type and reason): in recliner, increased time due to slower movement of RUE Lower Body Dressing: Maximal assistance Lower Body Dressing Details (indicate cue type and reason): min A sit<>stand but mod A for standing balance Toilet Transfer: Moderate assistance;Ambulation;RW   Toileting- Clothing Manipulation and Hygiene: Maximal assistance Toileting - Clothing Manipulation Details (indicate cue type and reason): min A sit<>stand but mod A for standing balance  Vision Baseline Vision/History: Wears glasses;Cataracts Wears Glasses: At all times Patient Visual Report: Other (comment)(can't see out of his glasses to read smaller print--I need new glasses) Vision Assessment?: Yes Eye Alignment: Within Functional  Limits Ocular Range of Motion: Restricted on the right;Other (comment)(additional eye shifts) Alignment/Gaze Preference: Head tilt(left) Tracking/Visual Pursuits: Other (comment)(both eyes do not want to track all the way laterally to right in a smooth movement, they stop short of full then had to ask pt to look further right to full ROM) Visual Fields: Right inferior homonymous quadranopsia            Pertinent Vitals/Pain Pain Assessment: No/denies pain     Hand Dominance Right   Extremity/Trunk Assessment Upper Extremity Assessment Upper Extremity Assessment: RUE deficits/detail RUE Deficits / Details: Slowness of movement compared to LUE and decreased coordination RUE Sensation: WNL RUE Coordination: decreased fine motor;decreased gross motor   Lower Extremity Assessment Lower Extremity Assessment: RLE deficits/detail RLE Deficits / Details: grossly 4/5   Cervical / Trunk Assessment Cervical / Trunk Assessment: Normal   Communication Communication Communication: No difficulties   Cognition Arousal/Alertness: Awake/alert Behavior During Therapy: WFL for tasks assessed/performed Overall Cognitive Status: Within Functional Limits for tasks assessed                                                Home Living Family/patient expects to be discharged to:: Private residence Living Arrangements: Spouse/significant other;Children(son home doing online college, wife works) Available Help at Discharge: Family;Available 24 hours/day Type of Home: House Home Access: Stairs to enter [employer] of Steps: 2 Entrance Stairs-Rails: Right Home Layout: Two level;1/2 bath on main level;Bed/bath upstairs Alternate Level Stairs-Number of Steps: flight   Bathroom Shower/Tub: Walk-in shower;Door   [employer] Toilet: Standard     Home Equipment: Grab bars - tub/shower;Hand held Tourist information centre manager - 4 wheels          Prior Functioning/Environment Level  of Independence: Independent with assistive device(s)        Comments: used rollator at all times        OT Problem List: Decreased strength;Impaired balance (sitting and/or standing);Impaired vision/perception;Impaired UE functional use;Decreased safety awareness;Decreased cognition      OT Treatment/Interventions: Self-care/ADL training;Therapeutic activities;DME and/or AE instruction;Patient/family education;Balance training;Visual/perceptual remediation/compensation    OT Goals(Current goals can be found in the care plan section) Acute Rehab OT Goals Patient Stated Goal: to go home OT Goal Formulation: With patient Time For Goal Achievement: 06/25/19 Potential to Achieve Goals: Good  OT Frequency: Min 2X/week           Co-evaluation PT/OT/SLP Co-Evaluation/Treatment: Yes Reason for Co-Treatment: Complexity of the patient's impairments (multi-system involvement);For patient/therapist safety;To address functional/ADL transfers PT goals addressed during session: Mobility/safety with mobility OT goals addressed during session: ADL's and self-care;Strengthening/ROM      AM-PAC OT "6 Clicks" Daily Activity     Outcome Measure Help from another person eating meals?: None Help from another person taking care of personal grooming?: A Little Help from another person toileting, which includes using toliet, bedpan, or urinal?: A Lot Help from another person bathing (including washing, rinsing, drying)?: A Lot Help from another person to put on and taking off regular upper body clothing?: A Little Help from another person to put on and taking off regular lower body clothing?: A Lot 6 Click Score: 16   End  of Session Equipment Utilized During Treatment: Gait belt;Rolling walker Nurse Communication: Mobility status  Activity Tolerance: Patient tolerated treatment well Patient left: in chair;with call bell/phone within reach;with chair alarm set  OT Visit Diagnosis: Unsteadiness on  feet (R26.81);Other abnormalities of gait and mobility (R26.89);Muscle weakness (generalized) (M62.81);Low vision, both eyes (H54.2)                Time: 1783-7542 OT Time Calculation (min): 37 min Charges:  OT General Charges $OT Visit: 1 Visit OT Evaluation $OT Eval Moderate Complexity: [place], OTR/L Acute [employer] Pager [phone] Office [phone]     Almon Register 06/11/2019, 12:39 PM

## 2019-06-11 NOTE — Evaluation (Addendum)
Physical Therapy Evaluation Patient Details Name: Isaiah [name] MRN: [id number] DOB: 24-Apr-1966 Today's Date: 06/11/2019   History of Present Illness  Gabriela Marling is a 53 y.o. male with medical history significant of CVA; seizure d/o; OSA; PE; multiple myeloma; HTN; ESRD on MWF HD; DM; and afib presenting with R facial droop, R sided weakness, and slurred speech this AM.  He is unable to receive tPA due to recent SDH.He was admitted at [facility] from 9/17-29 for planned autologous stem cell transplant for multiple myeloma.  However, he had seizure activity following HD on 9/18, and head CT revealed an acute on chronic R-sided SDH.CT on 10/1 showed 5 mm midline shift to the left.  Clinical Impression  Pt admitted with above diagnosis. Pt was able to ambulate with RW with mod assist of 2 due to LOB with right knee buckling noted.  Pt will need Rehab as he has balance and visual deficits.Pt also has a flight of steps to get to bedrooms and full bath.   Will follow acutely.  Pt currently with functional limitations due to the deficits listed below (see PT Problem List). Pt will benefit from skilled PT to increase their independence and safety with mobility to allow discharge to the venue listed below.      Follow Up Recommendations CIR;Supervision/Assistance - 24 hour    Equipment Recommendations  None recommended by PT    Recommendations for Other Services Rehab consult     Precautions / Restrictions Precautions Precautions: Fall Restrictions Weight Bearing Restrictions: No      Mobility  Bed Mobility Overal bed mobility: Needs Assistance Bed Mobility: Supine to Sit     Supine to sit: Min guard     General bed mobility comments: Was able to come to EOB with use of rail and incr time  Transfers Overall transfer level: Needs assistance Equipment used: Rolling walker (2 wheeled) Transfers: Sit to/from Stand Sit to Stand: Min assist;+2 safety/equipment;From elevated surface          General transfer comment: Pt needed steadying assist to rise as he was leaning posteriorly iniitally.  Relies on RW for balance  Ambulation/Gait Ambulation/Gait assistance: Min assist;Mod assist;+2 safety/equipment Gait Distance (Feet): 45 Feet Assistive device: Rolling walker (2 wheeled) Gait Pattern/deviations: Step-through pattern;Decreased stride length;Decreased weight shift to right;Antalgic;Leaning posteriorly   Gait velocity interpretation: <1.31 ft/sec, indicative of household ambulator General Gait Details: Pt was able to walk into hallway with the RW with +2 min assist initially.  As pt fatigued, noted that pt with right LE buckling and needed mod assist with one LOB prior to getting back to chair.  Close +2 assist warranted.  Needed assist to control descent into chair and cues for hand placement as well.   Stairs            Wheelchair Mobility    Modified Rankin (Stroke Patients Only) Modified Rankin (Stroke Patients Only) Pre-Morbid Rankin Score: Slight disability Modified Rankin: Moderately severe disability     Balance Overall balance assessment: Needs assistance Sitting-balance support: No upper extremity supported;Feet supported Sitting balance-Leahy Scale: Fair   Postural control: Posterior lean Standing balance support: Bilateral upper extremity supported;During functional activity Standing balance-Leahy Scale: Poor Standing balance comment: relies on UE support on RW static and dynamic as well as at least +1 external support                             Pertinent Vitals/Pain Pain Assessment: No/denies  pain    Home Living Family/patient expects to be discharged to:: Private residence Living Arrangements: Spouse/significant other;Children(son home doing online college, wife works) Available Help at Discharge: Family;Available 24 hours/day Type of Home: House Home Access: Stairs to enter Entrance Stairs-Rails: Right Entrance Stairs-Number  of Steps: 2 Home Layout: Two level;1/2 bath on main level;Bed/bath upstairs Home Equipment: Grab bars - tub/shower;Hand held shower head;Walker - 4 wheels      Prior Function Level of Independence: Independent with assistive device(s)         Comments: used rollator at all times     Hand Dominance   Dominant Hand: Right    Extremity/Trunk Assessment   Upper Extremity Assessment Upper Extremity Assessment: Defer to OT evaluation    Lower Extremity Assessment Lower Extremity Assessment: RLE deficits/detail RLE Deficits / Details: grossly 4/5    Cervical / Trunk Assessment Cervical / Trunk Assessment: Normal  Communication   Communication: No difficulties  Cognition Arousal/Alertness: Awake/alert Behavior During Therapy: WFL for tasks assessed/performed Overall Cognitive Status: Within Functional Limits for tasks assessed                                        General Comments      Exercises     Assessment/Plan    PT Assessment Patient needs continued PT services  PT Problem List Decreased activity tolerance;Decreased balance;Decreased mobility;Decreased knowledge of use of DME;Decreased safety awareness;Decreased knowledge of precautions       PT Treatment Interventions DME instruction;Gait training;Functional mobility training;Therapeutic activities;Therapeutic exercise;Balance training;Patient/family education;Stair training    PT Goals (Current goals can be found in the Care Plan section)  Acute Rehab PT Goals Patient Stated Goal: to go home PT Goal Formulation: With patient Time For Goal Achievement: 06/25/19 Potential to Achieve Goals: Good    Frequency Min 3X/week   Barriers to discharge        Co-evaluation PT/OT/SLP Co-Evaluation/Treatment: Yes Reason for Co-Treatment: Complexity of the patient's impairments (multi-system involvement);For patient/therapist safety PT goals addressed during session: Mobility/safety with  mobility         AM-PAC PT "6 Clicks" Mobility  Outcome Measure Help needed turning from your back to your side while in a flat bed without using bedrails?: None Help needed moving from lying on your back to sitting on the side of a flat bed without using bedrails?: A Little Help needed moving to and from a bed to a chair (including a wheelchair)?: A Lot Help needed standing up from a chair using your arms (e.g., wheelchair or bedside chair)?: A Lot Help needed to walk in hospital room?: A Lot Help needed climbing 3-5 steps with a railing? : A Lot 6 Click Score: 15    End of Session Equipment Utilized During Treatment: Gait belt Activity Tolerance: Patient limited by fatigue Patient left: in chair;with call bell/phone within reach;with chair alarm set Nurse Communication: Mobility status PT Visit Diagnosis: Unsteadiness on feet (R26.81);Muscle weakness (generalized) (M62.81);Other abnormalities of gait and mobility (R26.89)    Time: 2620-3559 PT Time Calculation (min) (ACUTE ONLY): 34 min   Charges:   PT Evaluation $PT Eval Moderate Complexity: [place] Pager:  [phone]  Office:  [phone]   Denice Paradise 06/11/2019, 11:20 AM

## 2019-06-11 NOTE — Progress Notes (Signed)
PROGRESS NOTE    [facility]  TMH:[id number] DOB: September 04, 1966 DOA: 06/09/2019 PCP: [name], MD   Brief Narrative:  HPI per Dr. Vergia Alcon is a 53 y.o. male with medical history significant of CVA; seizure d/o; OSA on CPAP; PE on Coumadin; multiple myeloma; HTN; ESRD on MWF HD; DM; and afib presenting with R facial droop, R sided weakness, and slurred speech this AM.  He is unable to receive tPA due to recent SDH.  After he got home from the hospital, he was trying to get up the stairs and around the house.  He walked with a walker some but mostly sat in a chair.  He was able to swallow.  Everything was good while sleeping.  About 7AM yersterday, he took his medicine and kind of threw it up.  He noticed weakness, difficulty getting up onto to walker.  He was took weak to go to HD.  He was able to tolerate breakfast, lunch, dinner. He was able to walk but was fatigued.  This AM, he got up and took his medicine.  An hour later, he called his wife and was vomiting.  He was unable to lift his legs.  His right LE was weaker than she had ever seen it.  His speech was worse than normal - generally understandable speech but more slurred than normal. His R facial droop was more than usual.  He was admitted at [name] from 9/17-29 for planned autologous stem cell transplant for multiple myeloma.  However, he had seizure activity following HD on 9/18, and head CT revealed an acute on chronic R-sided SDH.  He was also found to have Proteus mirabilis bacteremia (pan-sensitive), which was treated with Zosyn.  He was found to have B renal stones, but urology deferred removal in the setting of bacteremia.  He was transitioned to Cipro for continuous prophylactic therapy.  He was also noted to have aspiration PNA.  For his seizures, he was changed to Vimpat and Depabote with Klonopin after HD.  Given his multitude of medical problems, stem cell transplantation was held.   ED Course: Discharged from [name]  Tuesday - multiple myeloma, Proteus bacteremia, seizures, SDH. He got better and was discharged Tuesday. Doing ok until yesterday - worse on the right, trouble with words. Dr. Leonel Ramsay saw him. ?change in CT. Dr. Ellene Route looked at the CT - probably needs overnight evaluation, would consider evacuation.  Assessment & Plan:   Principal Problem:   Acute on chronic intracranial subdural hematoma (HCC) Active Problems:   Multiple myeloma not having achieved remission (HCC)   ESRD on hemodialysis ([place])   Essential hypertension   DM type 2 (diabetes mellitus, type 2) (HCC)   Seizure disorder (HCC)   Aspiration pneumonia (HCC)   History of bacteremia   Atrial fibrillation, chronic (HCC)   SDH (subdural hematoma) (HCC)  1 acute/subacute/chronic SDH Questionable etiology.  Patient with prior to admission and found to have an acute on chronic right-sided SDH.  Patient had presented with worsening neurological symptoms on day of admission with concern for progressive disease/new CVA.  During prior hospitalization at [facility], The patient was noted to have a 2 mm midline shift.  Head CT done on admission with increased midline shift to 4 mm.  MRI head done with right-sided subdural hematoma appearing predominantly subacute although there may be an area of more recent hemorrhage posteriorly based on high density on CT.  Fluid measures up to 14 mm in thickness in the parietal lobe and  9 mm in thickness in the frontal lobe, 5 mm midline shift to the left.  Negative for any acute infarct.  Generalized atrophy with chronic ischemia.  Patient feels right-sided weakness slowly improving.  Dysarthria improved.  May need need repeat head CT in 48 hours however will defer to neurosurgery.  Continue PT/OT/SLP.  Consult with CIR to see whether patient is an appropriate candidate.  Neurology and neurosurgery following and appreciate their input and recommendations.  2.  End-stage renal disease on hemodialysis Patient being  followed by nephrology and receiving hemodialysis. Continue PhosLo, Sensipar, Rena-Vite, Klonopin.  Per nephrology.  3.  Recent aspiration pneumonia Noted on CT scan 916.  Patient was on IV Zosyn and transitioned to ciprofloxacin on 06/02/2019.  Currently on oral ciprofloxacin which we will continue for now.  4.  Seizure d/o No seizures noted.  Continue home regimen of Vimpat and Lamictal.  5.  Paroxysmal atrial fibrillation Patient with history of A. fib and PFO.  Status post IVC filter.  Patient not on anticoagulation likely not a candidate secondary to problem #1.  Patient was on aspirin which we will hold for now secondary to problem #1.  Continue metoprolol for rate control.  6.  Diabetes mellitus CBG of 92 this morning.  Check a hemoglobin A1c.  Continue sliding scale insulin.  7.  Hypertension Stable.  Continue Norvasc, metoprolol.  8.  History of Proteus bacteremia During recent hospitalization patient noted to have a Proteus bacteremia with 1 out of 2 cultures which were pansensitive.  Patient currently on Cipro.  Repeat blood cultures from 05/30/2019-.  Repeat blood cultures ordered and pending with no growth to date..  Urology deferred stone removal due to bacteremia will need outpatient follow-up with urology.  9.  Multiple myeloma Patient previously followed for MGUS but bone marrow biopsy in 2018 showed almost 20% plasma cell infiltration.  Skeletal survey negative in 10/2017.  Treatment deferred in 12/2017 due to lack of symptoms.  8/19 bone marrow biopsy with 25% abnormal plasma cells, FISH with IgG kappa.  Skeletal survey remain negative.  Patient recently admitted to [facility] stem cell transplant but delayed due to other medical problems that occurred while hospitalized.   DVT prophylaxis: SCDs Code Status: Full Family Communication: Updated patient.  No family at bedside. Disposition Plan: CIR recommended per PT.     Consultants:   Neurosurgery: Dr. Ellene Route  06/09/2019  Neurology: Dr. Leonel Ramsay 06/09/2019  Procedures:   MRI brain 06/09/2019  CT head 06/09/2019  Chest x-ray 06/09/2019  Antimicrobials:   Ciprofloxacin started prior to admission.   Subjective: Patient sitting in chair just finished working with PT and OT.  Patient feels right-sided weakness improving and close to baseline.  Patient denies any chest pain or shortness of breath.  Dysarthric speech improved.  Objective: Vitals:   06/11/19 0800 06/11/19 0836 06/11/19 0900 06/11/19 1000  BP:   (!) 145/82 (!) 151/78  Pulse:      Resp: 12  (!) 9 13  Temp:      TempSrc:      SpO2: 94% 93% 96% 100%  Weight:        Intake/Output Summary (Last 24 hours) at 06/11/2019 1044 Last data filed at 06/10/2019 1947 Gross per 24 hour  Intake 360 ml  Output 2003 ml  Net -1643 ml   Filed Weights   06/10/19 0130 06/10/19 0721 06/10/19 1206  Weight: 84.7 kg 86.2 kg 83.6 kg    Examination:  General exam: NAD Respiratory system: Clear  to auscultation bilaterally.  No wheezes, no crackles, no rhonchi.  Speaking in full sentences.  Normal respiratory effort.   Cardiovascular system: Regular rate rhythm no murmurs rubs or gallops.  No JVD.  No lower extremity edema.  Gastrointestinal system: Abdomen is soft, nontender, nondistended, positive bowel sounds.  No rebound.  No guarding.  Central nervous system: Alert and oriented.  Right facial weakness.  Right-sided weakness.   Extremities: Right-sided weakness.  Skin: No rashes, lesions or ulcers Psychiatry: Judgement and insight appear normal. Mood & affect appropriate.     Data Reviewed: I have personally reviewed following labs and imaging studies  CBC: Recent Labs  Lab 06/09/19 1047 06/09/19 1100 06/10/19 0810  WBC 8.9  --  4.5  NEUTROABS 6.4  --   --   HGB 9.5* 10.9* 8.8*  HCT 26.0* 32.0* RESULTS UNAVAILABLE DUE TO INTERFERING SUBSTANCE  MCV 94.2  --  RESULTS UNAVAILABLE DUE TO INTERFERING SUBSTANCE  PLT 282  --  956    Basic Metabolic Panel: Recent Labs  Lab 06/09/19 1047 06/09/19 1100 06/10/19 0810 06/11/19 0928  NA 139 138 138 138  K 4.7 4.8 4.3 4.0  CL 101 103 102 99  CO2 21*  --  24 28  GLUCOSE 119* 118* 110* 84  BUN 32* 33* 32* 20  CREATININE 13.08* 12.90* 12.44* 8.56*  CALCIUM 8.9  --  8.6* 8.3*  PHOS  --   --  2.3* 2.7   GFR: CrCl cannot be calculated (Unknown ideal weight.). Liver Function Tests: Recent Labs  Lab 06/09/19 1047 06/10/19 0810 06/11/19 0928  AST 18  --   --   ALT 9  --   --   ALKPHOS 78  --   --   BILITOT 0.7  --   --   PROT 6.3*  --   --   ALBUMIN 3.4* 3.2* 3.0*   Recent Labs  Lab 06/09/19 1047  LIPASE 30   No results for input(s): AMMONIA in the last 168 hours. Coagulation Profile: Recent Labs  Lab 06/09/19 1047  INR 1.2   Cardiac Enzymes: No results for input(s): CKTOTAL, CKMB, CKMBINDEX, TROPONINI in the last 168 hours. BNP (last 3 results) No results for input(s): PROBNP in the last 8760 hours. HbA1C: No results for input(s): HGBA1C in the last 72 hours. CBG: Recent Labs  Lab 06/10/19 0624 06/10/19 1243 06/10/19 1715 06/10/19 2149 06/11/19 0653  GLUCAP 83 106* 114* 108* 92   Lipid Profile: No results for input(s): CHOL, HDL, LDLCALC, TRIG, CHOLHDL, LDLDIRECT in the last 72 hours. Thyroid Function Tests: No results for input(s): TSH, T4TOTAL, FREET4, T3FREE, THYROIDAB in the last 72 hours. Anemia Panel: No results for input(s): VITAMINB12, FOLATE, FERRITIN, TIBC, IRON, RETICCTPCT in the last 72 hours. Sepsis Labs: Recent Labs  Lab 06/09/19 1414  LATICACIDVEN 0.8    Recent Results (from the past 240 hour(s))  SARS Coronavirus 2 [name] order, Performed in [facility] hospital lab) Nasopharyngeal Nasopharyngeal Swab     Status: None   Collection Time: 06/09/19 10:47 AM   Specimen: Nasopharyngeal Swab  Result Value Ref Range Status   SARS Coronavirus 2 NEGATIVE NEGATIVE Final    Comment: (NOTE) If result is NEGATIVE  SARS-CoV-2 target nucleic acids are NOT DETECTED. The SARS-CoV-2 RNA is generally detectable in upper and lower  respiratory specimens during the acute phase of infection. The lowest  concentration of SARS-CoV-2 viral copies this assay can detect is 250  copies / mL. A negative result does not  preclude SARS-CoV-2 infection  and should not be used as the sole basis for treatment or other  patient management decisions.  A negative result may occur with  improper specimen collection / handling, submission of specimen other  than nasopharyngeal swab, presence of viral mutation(s) within the  areas targeted by this assay, and inadequate number of viral copies  (<250 copies / mL). A negative result must be combined with clinical  observations, patient history, and epidemiological information. If result is POSITIVE SARS-CoV-2 target nucleic acids are DETECTED. The SARS-CoV-2 RNA is generally detectable in upper and lower  respiratory specimens dur ing the acute phase of infection.  Positive  results are indicative of active infection with SARS-CoV-2.  Clinical  correlation with patient history and other diagnostic information is  necessary to determine patient infection status.  Positive results do  not rule out bacterial infection or co-infection with other viruses. If result is PRESUMPTIVE POSTIVE SARS-CoV-2 nucleic acids MAY BE PRESENT.   A presumptive positive result was obtained on the submitted specimen  and confirmed on repeat testing.  While 2019 novel coronavirus  (SARS-CoV-2) nucleic acids may be present in the submitted sample  additional confirmatory testing may be necessary for epidemiological  and / or clinical management purposes  to differentiate between  SARS-CoV-2 and other Sarbecovirus currently known to infect humans.  If clinically indicated additional testing with an alternate test  methodology [id number]) is advised. The SARS-CoV-2 RNA is generally  detectable in upper  and lower respiratory sp ecimens during the acute  phase of infection. The expected result is Negative. Fact Sheet for Patients:  StrictlyIdeas.no Fact Sheet for Healthcare Providers: BankingDealers.co.za This test is not yet approved or cleared by the [place] FDA and has been authorized for detection and/or diagnosis of SARS-CoV-2 by FDA under an Emergency Use Authorization (EUA).  This EUA will remain in effect (meaning this test can be used) for the duration of the COVID-19 declaration under Section 564(b)(1) of the Act, 21 U.S.C. section 360bbb-3(b)(1), unless the authorization is terminated or revoked sooner. Performed at [facility], [place] [address] [place], [place] [address]   Blood culture (routine x 2)     Status: None (Preliminary result)   Collection Time: 06/09/19 10:47 AM   Specimen: BLOOD  Result Value Ref Range Status   Specimen Description BLOOD UNKNOWN  Final   Special Requests   Final    BOTTLES DRAWN AEROBIC AND ANAEROBIC Blood Culture results may not be optimal due to an inadequate volume of blood received in culture bottles   Culture   Final    NO GROWTH < 24 HOURS Performed at [facility], [place] [address] [place], [place] [address]    Report Status PENDING  Incomplete         Radiology Studies: Mr Brain Wo Contrast  Result Date: 06/09/2019 CLINICAL DATA:  Multiple myeloma, sepsis, diabetes. Subdural hemorrhage on CT today EXAM: MRI HEAD WITHOUT CONTRAST TECHNIQUE: Multiplanar, multiecho pulse sequences of the brain and surrounding structures were obtained without intravenous contrast. COMPARISON:  CT head 06/09/2019 FINDINGS: Brain: Subdural hematoma on the right has complex signal characteristics compatible with subacute to chronic blood. Based on CT, there may be a small amount of more recent hemorrhage posteriorly. There are multiple septations within the fluid. The subdural fluid measures  approximately 14 mm in thickness in the parietal lobe and approximately 9 mm in thickness in the frontal lobe. There is mass-effect on the right hemisphere with  5 mm midline shift to the left. Ventricle size normal.  Negative for acute infarct Chronic hemorrhagic infarct left posterior frontal lobe. Chronic hemorrhagic infarct right posterior temporal/occipital lobe. Moderate atrophy. Vascular: Normal arterial flow voids Skull and upper cervical spine: No focal bony lesion. Sinuses/Orbits: Mucosal edema paranasal sinuses. Cyst left maxillary sinus. Negative orbit. Other: None IMPRESSION: Right-sided subdural hematoma appears predominantly subacute although there may be an area of more recent hemorrhage posteriorly based on high density on CT. Fluid measures up to 14 mm in thickness in the parietal lobe and 9 mm in thickness in the frontal lobe. 5 mm midline shift to the left. Negative for acute infarct Generalized atrophy with chronic ischemia. Electronically Signed   By: Franchot Gallo M.D.   On: 06/09/2019 16:58        Scheduled Meds: .  stroke: mapping our early stages of recovery book   Does not apply Once  . acyclovir  400 mg Oral QHS  . amLODipine  5 mg Oral Daily  . calcium acetate  2,001 mg Oral TID WC  . calcium carbonate  2 tablet Oral QHS  . Chlorhexidine Gluconate Cloth  6 each Topical Q0600  . cinacalcet  60 mg Oral QPM  . ciprofloxacin  500 mg Oral QPM  . clonazePAM  0.5 mg Oral Q M,W,F-HD  . feeding supplement (PRO-STAT SUGAR FREE 64)  30 mL Oral TID BM  . fluticasone furoate-vilanterol  1 puff Inhalation Daily  . insulin aspart  0-5 Units Subcutaneous QHS  . insulin aspart  0-9 Units Subcutaneous TID WC  . lacosamide  150 mg Oral BID  . lacosamide  150 mg Oral Q M,W,F-HD  . lamoTRIgine  150 mg Oral BID  . metoprolol tartrate  12.5 mg Oral BID  . multivitamin  1 tablet Oral QHS  . pantoprazole  40 mg Oral Daily   Continuous Infusions:   LOS: 2 days    Time spent: 35  minutes    Irine Seal, MD [facility]  If 7PM-7AM, please contact night-coverage [email or web address] 06/11/2019, 10:44 AM

## 2019-06-12 ENCOUNTER — Inpatient Hospital Stay (HOSPITAL_COMMUNITY): Payer: Medicare Other

## 2019-06-12 ENCOUNTER — Other Ambulatory Visit: Payer: Self-pay

## 2019-06-12 LAB — CBC
Hemoglobin: 9.4 g/dL — ABNORMAL LOW (ref 13.0–17.0)
Platelets: 297 10*3/uL (ref 150–400)
WBC: 6.1 10*3/uL (ref 4.0–10.5)

## 2019-06-12 LAB — RENAL FUNCTION PANEL
Albumin: 3 g/dL — ABNORMAL LOW (ref 3.5–5.0)
Anion gap: 11 (ref 5–15)
BUN: 33 mg/dL — ABNORMAL HIGH (ref 6–20)
CO2: 27 mmol/L (ref 22–32)
Calcium: 8.3 mg/dL — ABNORMAL LOW (ref 8.9–10.3)
Chloride: 100 mmol/L (ref 98–111)
Creatinine, Ser: 10.13 mg/dL — ABNORMAL HIGH (ref 0.61–1.24)
GFR calc Af Amer: 6 mL/min — ABNORMAL LOW (ref >60)
GFR calc non Af Amer: 5 mL/min — ABNORMAL LOW (ref >60)
Glucose, Bld: 83 mg/dL (ref 70–99)
Phosphorus: 2.9 mg/dL (ref 2.5–4.6)
Potassium: 4.3 mmol/L (ref 3.5–5.1)
Sodium: 138 mmol/L (ref 135–145)

## 2019-06-12 LAB — GLUCOSE, CAPILLARY
Glucose-Capillary: 93 mg/dL (ref 70–99)
Glucose-Capillary: 84 mg/dL (ref 70–99)
Glucose-Capillary: 153 mg/dL — ABNORMAL HIGH (ref 70–99)
Glucose-Capillary: 117 mg/dL — ABNORMAL HIGH (ref 70–99)

## 2019-06-12 MED ORDER — CHLORHEXIDINE GLUCONATE CLOTH 2 % EX PADS: 6.0000 | MEDICATED_PAD | Freq: Every day | CUTANEOUS | Status: DC

## 2019-06-12 NOTE — Progress Notes (Addendum)
PROGRESS NOTE    [name]  WJX:[id number] DOB: 07/05/1966 DOA: 06/09/2019 PCP: [name], MD   Brief Narrative:  HPI per Dr. Vergia Alcon is a 53 y.o. male with medical history significant of CVA; seizure d/o; OSA on CPAP; PE on Coumadin; multiple myeloma; HTN; ESRD on MWF HD; DM; and afib presenting with R facial droop, R sided weakness, and slurred speech this AM.  He is unable to receive tPA due to recent SDH.  After he got home from the hospital, he was trying to get up the stairs and around the house.  He walked with a walker some but mostly sat in a chair.  He was able to swallow.  Everything was good while sleeping.  About 7AM yersterday, he took his medicine and kind of threw it up.  He noticed weakness, difficulty getting up onto to walker.  He was took weak to go to HD.  He was able to tolerate breakfast, lunch, dinner. He was able to walk but was fatigued.  This AM, he got up and took his medicine.  An hour later, he called his wife and was vomiting.  He was unable to lift his legs.  His right LE was weaker than she had ever seen it.  His speech was worse than normal - generally understandable speech but more slurred than normal. His R facial droop was more than usual.  He was admitted at [facility] from 9/17-29 for planned autologous stem cell transplant for multiple myeloma.  However, he had seizure activity following HD on 9/18, and head CT revealed an acute on chronic R-sided SDH.  He was also found to have Proteus mirabilis bacteremia (pan-sensitive), which was treated with Zosyn.  He was found to have B renal stones, but urology deferred removal in the setting of bacteremia.  He was transitioned to Cipro for continuous prophylactic therapy.  He was also noted to have aspiration PNA.  For his seizures, he was changed to Vimpat and Depabote with Klonopin after HD.  Given his multitude of medical problems, stem cell transplantation was held.   ED Course: Discharged from [name]  Tuesday - multiple myeloma, Proteus bacteremia, seizures, SDH. He got better and was discharged Tuesday. Doing ok until yesterday - worse on the right, trouble with words. Dr. Leonel Ramsay saw him. ?change in CT. Dr. Ellene Route looked at the CT - probably needs overnight evaluation, would consider evacuation.  Assessment & Plan:   Principal Problem:   Acute on chronic intracranial subdural hematoma (HCC) Active Problems:   Multiple myeloma not having achieved remission (HCC)   ESRD on hemodialysis ([place])   Essential hypertension   DM type 2 (diabetes mellitus, type 2) (HCC)   Seizure disorder (HCC)   Aspiration pneumonia (HCC)   History of bacteremia   Atrial fibrillation, chronic (HCC)   SDH (subdural hematoma) (HCC)  1 acute/subacute/chronic SDH Questionable etiology.  Patient with prior to admission and found to have an acute on chronic right-sided SDH.  Patient had presented with worsening neurological symptoms on day of admission with concern for progressive disease/new CVA.  During prior hospitalization at [facility] patient was noted to have a 2 mm midline shift.  Head CT done on admission with increased midline shift to 4 mm.  MRI head done with right-sided subdural hematoma appearing predominantly subacute although there may be an area of more recent hemorrhage posteriorly based on high density on CT.  Fluid measures up to 14 mm in thickness in the parietal lobe and  9 mm in thickness in the frontal lobe, 5 mm midline shift to the left.  Negative for any acute infarct.  Generalized atrophy with chronic ischemia.  Patient feels right-sided weakness slowly improving.  Dysarthria improved.  Repeat head CT done this morning with results pending.  Neurosurgery following.  Continue PT/OT/SLP.  CIR has been consulted to see whether patient is an appropriate candidate. Neurology and neurosurgery following and appreciate their input and recommendations.  2.  End-stage renal disease on hemodialysis  Patient being followed by nephrology and receiving hemodialysis. Continue PhosLo, Sensipar, Rena-Vite, Klonopin.  Per nephrology.  3.  Recent aspiration pneumonia Noted on CT scan 916.  Patient was on IV Zosyn and transitioned to ciprofloxacin on 06/02/2019.  Currently on oral ciprofloxacin which we will continue for now.  4.  Seizure d/o No seizures noted.  Continue home regimen of Vimpat and Lamictal.  5.  Paroxysmal atrial fibrillation Patient with history of A. fib and PFO.  Status post IVC filter.  Patient not on anticoagulation likely not a candidate secondary to problem #1.  Patient was on aspirin which we will hold for now secondary to problem #1.  Continue metoprolol for rate control.  6.  Diabetes mellitus CBG of 93 this morning.  Hemoglobin A1c pending.  Continue sliding scale insulin.  7.  Hypertension Continue Norvasc, metoprolol.  8.  History of Proteus bacteremia During recent hospitalization patient noted to have a Proteus bacteremia with 1/2 cultures which were pansensitive.  Patient currently on Cipro.  Repeat blood cultures from 05/30/2019-.  Repeat blood cultures ordered and pending with no growth to date..  Urology deferred stone removal due to bacteremia will need outpatient follow-up with urology.  9.  Multiple myeloma Patient previously followed for MGUS but bone marrow biopsy in 2018 showed almost 20% plasma cell infiltration.  Skeletal survey negative in 10/2017.  Treatment deferred in 12/2017 due to lack of symptoms.  8/19 bone marrow biopsy with 25% abnormal plasma cells, FISH with IgG kappa.  Skeletal survey remain negative.  Patient recently admitted to [name] for stem cell transplant but delayed due to other medical problems that occurred while hospitalized.   DVT prophylaxis: SCDs Code Status: Full Family Communication: Updated patient.  Updated wife via telephone. Disposition Plan: Transfer to telemetry.  CIR recommended per PT.     Consultants:    Neurosurgery: Dr. Ellene Route 06/09/2019  Neurology: Dr. Leonel Ramsay 06/09/2019  Procedures:   MRI brain 06/09/2019  CT head 06/09/2019  Chest x-ray 06/09/2019  Antimicrobials:   Ciprofloxacin started prior to admission.   Subjective: Patient sleeping states just returned from repeat head CT this morning.  Right sided weakness slowly improving however not at baseline.  No chest pain.  No shortness of breath.  Dysarthric speech improved.   Objective: Vitals:   06/11/19 2350 06/12/19 0429 06/12/19 0854 06/12/19 0915  BP: 139/79 (!) 125/59  136/68  Pulse: 74 76    Resp: 10     Temp: 98 F (36.7 C) 99.3 F (37.4 C)  (P) 98.9 F (37.2 C)  TempSrc: Oral Oral    SpO2: 96% 95% 95%   Weight:  83.4 kg      Intake/Output Summary (Last 24 hours) at 06/12/2019 1023 Last data filed at 06/12/2019 0430 Gross per 24 hour  Intake 360 ml  Output 0 ml  Net 360 ml   Filed Weights   06/10/19 0721 06/10/19 1206 06/12/19 0429  Weight: 86.2 kg 83.6 kg 83.4 kg    Examination:  General  exam: NAD Respiratory system: CTA B.  No wheezes, no crackles, no rhonchi.  Normal respiratory effort.  Speaking in full sentences.   Cardiovascular system: RRR no murmurs rubs or gallops.  No JVD.  No lower extremity edema.   Gastrointestinal system: Abdomen is nontender, nondistended, soft, positive bowel sounds.  No rebound.  No guarding.   Central nervous system: Alert and oriented.  Right facial weakness.  Right-sided weakness.   Extremities: Right-sided weakness.  Skin: No rashes, lesions or ulcers Psychiatry: Judgement and insight appear normal. Mood & affect appropriate.     Data Reviewed: I have personally reviewed following labs and imaging studies  CBC: Recent Labs  Lab 06/09/19 1047 06/09/19 1100 06/10/19 0810 06/11/19 0928 06/12/19 0441  WBC 8.9  --  4.5 5.6 6.1  NEUTROABS 6.4  --   --  3.0  --   HGB 9.5* 10.9* 8.8* 9.0* 9.4*  HCT 26.0* 32.0* RESULTS UNAVAILABLE DUE TO INTERFERING  SUBSTANCE RESULTS UNAVAILABLE DUE TO INTERFERING SUBSTANCE RESULTS UNAVAILABLE DUE TO INTERFERING SUBSTANCE  MCV 94.2  --  RESULTS UNAVAILABLE DUE TO INTERFERING SUBSTANCE RESULTS UNAVAILABLE DUE TO INTERFERING SUBSTANCE RESULTS UNAVAILABLE DUE TO INTERFERING SUBSTANCE  PLT 282  --  281 289 092   Basic Metabolic Panel: Recent Labs  Lab 06/09/19 1047 06/09/19 1100 06/10/19 0810 06/11/19 0928 06/12/19 0441  NA 139 138 138 138 138  K 4.7 4.8 4.3 4.0 4.3  CL 101 103 102 99 100  CO2 21*  --  24 28 27   GLUCOSE 119* 118* 110* 84 83  BUN 32* 33* 32* 20 33*  CREATININE 13.08* 12.90* 12.44* 8.56* 10.13*  CALCIUM 8.9  --  8.6* 8.3* 8.3*  PHOS  --   --  2.3* 2.7 2.9   GFR: CrCl cannot be calculated (Unknown ideal weight.). Liver Function Tests: Recent Labs  Lab 06/09/19 1047 06/10/19 0810 06/11/19 0928 06/12/19 0441  AST 18  --   --   --   ALT 9  --   --   --   ALKPHOS 78  --   --   --   BILITOT 0.7  --   --   --   PROT 6.3*  --   --   --   ALBUMIN 3.4* 3.2* 3.0* 3.0*   Recent Labs  Lab 06/09/19 1047  LIPASE 30   No results for input(s): AMMONIA in the last 168 hours. Coagulation Profile: Recent Labs  Lab 06/09/19 1047  INR 1.2   Cardiac Enzymes: No results for input(s): CKTOTAL, CKMB, CKMBINDEX, TROPONINI in the last 168 hours. BNP (last 3 results) No results for input(s): PROBNP in the last 8760 hours. HbA1C: No results for input(s): HGBA1C in the last 72 hours. CBG: Recent Labs  Lab 06/11/19 0653 06/11/19 1142 06/11/19 1557 06/11/19 2108 06/12/19 0559  GLUCAP 92 144* 101* 132* 93   Lipid Profile: No results for input(s): CHOL, HDL, LDLCALC, TRIG, CHOLHDL, LDLDIRECT in the last 72 hours. Thyroid Function Tests: No results for input(s): TSH, T4TOTAL, FREET4, T3FREE, THYROIDAB in the last 72 hours. Anemia Panel: No results for input(s): VITAMINB12, FOLATE, FERRITIN, TIBC, IRON, RETICCTPCT in the last 72 hours. Sepsis Labs: Recent Labs  Lab 06/09/19 1414   LATICACIDVEN 0.8    Recent Results (from the past 240 hour(s))  SARS Coronavirus 2 [name] order, Performed in [facility] hospital lab) Nasopharyngeal Nasopharyngeal Swab     Status: None   Collection Time: 06/09/19 10:47 AM   Specimen: Nasopharyngeal Swab  Result  Value Ref Range Status   SARS Coronavirus 2 NEGATIVE NEGATIVE Final    Comment: (NOTE) If result is NEGATIVE SARS-CoV-2 target nucleic acids are NOT DETECTED. The SARS-CoV-2 RNA is generally detectable in upper and lower  respiratory specimens during the acute phase of infection. The lowest  concentration of SARS-CoV-2 viral copies this assay can detect is 250  copies / mL. A negative result does not preclude SARS-CoV-2 infection  and should not be used as the sole basis for treatment or other  patient management decisions.  A negative result may occur with  improper specimen collection / handling, submission of specimen other  than nasopharyngeal swab, presence of viral mutation(s) within the  areas targeted by this assay, and inadequate number of viral copies  (<250 copies / mL). A negative result must be combined with clinical  observations, patient history, and epidemiological information. If result is POSITIVE SARS-CoV-2 target nucleic acids are DETECTED. The SARS-CoV-2 RNA is generally detectable in upper and lower  respiratory specimens dur ing the acute phase of infection.  Positive  results are indicative of active infection with SARS-CoV-2.  Clinical  correlation with patient history and other diagnostic information is  necessary to determine patient infection status.  Positive results do  not rule out bacterial infection or co-infection with other viruses. If result is PRESUMPTIVE POSTIVE SARS-CoV-2 nucleic acids MAY BE PRESENT.   A presumptive positive result was obtained on the submitted specimen  and confirmed on repeat testing.  While 2019 novel coronavirus  (SARS-CoV-2) nucleic acids may be present in  the submitted sample  additional confirmatory testing may be necessary for epidemiological  and / or clinical management purposes  to differentiate between  SARS-CoV-2 and other Sarbecovirus currently known to infect humans.  If clinically indicated additional testing with an alternate test  methodology [phone]) is advised. The SARS-CoV-2 RNA is generally  detectable in upper and lower respiratory sp ecimens during the acute  phase of infection. The expected result is Negative. Fact Sheet for Patients:  StrictlyIdeas.no Fact Sheet for Healthcare Providers: BankingDealers.co.za This test is not yet approved or cleared by the [place] FDA and has been authorized for detection and/or diagnosis of SARS-CoV-2 by FDA under an Emergency Use Authorization (EUA).  This EUA will remain in effect (meaning this test can be used) for the duration of the COVID-19 declaration under Section 564(b)(1) of the Act, 21 U.S.C. section 360bbb-3(b)(1), unless the authorization is terminated or revoked sooner. Performed at [facility], [place] [address] [place], [place] [address]   Blood culture (routine x 2)     Status: None (Preliminary result)   Collection Time: 06/09/19 10:47 AM   Specimen: BLOOD  Result Value Ref Range Status   Specimen Description BLOOD UNKNOWN  Final   Special Requests   Final    BOTTLES DRAWN AEROBIC AND ANAEROBIC Blood Culture results may not be optimal due to an inadequate volume of blood received in culture bottles   Culture   Final    NO GROWTH 3 DAYS Performed at [facility], [place] [address], [place], [place] [address]    Report Status PENDING  Incomplete         Radiology Studies: Ct Head Wo Contrast  Result Date: 06/12/2019 CLINICAL DATA:  53 year old male with multiple myeloma. Right side subdural hematoma. EXAM: CT HEAD WITHOUT CONTRAST TECHNIQUE: Contiguous axial images were obtained from the base of  the skull through the vertex without intravenous contrast. COMPARISON:  Brain  MRI and head CT 06/09/2019. FINDINGS: Brain: Mixed density right side subdural hematoma measures up to 14-15 millimeters in thickness and appears unchanged since 06/09/2019, including several curvilinear areas of hyperdense blood within the background of hypodense subdural. Smaller more hyperdense right tentorial and right occipital pole component is also stable. Leftward midline shift of 3-4 millimeters is stable. Basilar cisterns remain patent. No ventriculomegaly no new intracranial hemorrhage identified. Chronic encephalomalacia in the left MCA, right PCA, and left cerebellar artery territories. Stable gray-white matter differentiation throughout the brain. No cortically based acute infarct identified. Vascular: Calcified atherosclerosis at the skull base. No suspicious intracranial vascular hyperdensity. Skull: No acute osseous abnormality identified. Sinuses/Orbits: Left maxillary sinus mucous retention cyst. Other Visualized paranasal sinuses and mastoids are stable and well pneumatized. Other: No acute orbit or scalp soft tissue findings. Left occipital region scalp sebaceous cyst. IMPRESSION: 1. Stable mixed density right side subdural hematoma since 06/09/2019. Stable intracranial mass effect with leftward midline shift of 3-4 mm. 2. No new intracranial abnormality. 3. Chronic ischemia in the left MCA, right PCA, and left cerebellar artery territories. Electronically Signed   By: Genevie Ann M.D.   On: 06/12/2019 09:54        Scheduled Meds: .  stroke: mapping our early stages of recovery book   Does not apply Once  . acyclovir  400 mg Oral QHS  . amLODipine  5 mg Oral Daily  . calcium acetate  2,001 mg Oral TID WC  . calcium carbonate  2 tablet Oral QHS  . Chlorhexidine Gluconate Cloth  6 each Topical Q0600  . cinacalcet  60 mg Oral QPM  . ciprofloxacin  500 mg Oral QPM  . clonazePAM  0.5 mg Oral Q M,W,F-HD  .  feeding supplement (PRO-STAT SUGAR FREE 64)  30 mL Oral TID BM  . fluticasone furoate-vilanterol  1 puff Inhalation Daily  . insulin aspart  0-5 Units Subcutaneous QHS  . insulin aspart  0-9 Units Subcutaneous TID WC  . lacosamide  150 mg Oral BID  . lacosamide  150 mg Oral Q M,W,F-HD  . lamoTRIgine  150 mg Oral BID  . metoprolol tartrate  12.5 mg Oral BID  . multivitamin  1 tablet Oral QHS  . pantoprazole  40 mg Oral Daily   Continuous Infusions:   LOS: 3 days    Time spent: 35 minutes    Irine Seal, MD [facility]  If 7PM-7AM, please contact night-coverage [email or web address] 06/12/2019, 10:23 AM

## 2019-06-12 NOTE — Progress Notes (Addendum)
[facility] Progress Note  Subjective: has no new c/o's.   Vitals:   06/11/19 2350 06/12/19 0429 06/12/19 0854 06/12/19 0915  BP: 139/79 (!) 125/59  136/68  Pulse: 74 76    Resp: 10     Temp: 98 F (36.7 C) 99.3 F (37.4 C)  (P) 98.9 F (37.2 C)  TempSrc: Oral Oral    SpO2: 96% 95% 95%   Weight:  83.4 kg      Inpatient medications: .  stroke: mapping our early stages of recovery book   Does not apply Once  . acyclovir  400 mg Oral QHS  . amLODipine  5 mg Oral Daily  . calcium acetate  2,001 mg Oral TID WC  . calcium carbonate  2 tablet Oral QHS  . Chlorhexidine Gluconate Cloth  6 each Topical Q0600  . cinacalcet  60 mg Oral QPM  . ciprofloxacin  500 mg Oral QPM  . clonazePAM  0.5 mg Oral Q M,W,F-HD  . feeding supplement (PRO-STAT SUGAR FREE 64)  30 mL Oral TID BM  . fluticasone furoate-vilanterol  1 puff Inhalation Daily  . insulin aspart  0-5 Units Subcutaneous QHS  . insulin aspart  0-9 Units Subcutaneous TID WC  . lacosamide  150 mg Oral BID  . lacosamide  150 mg Oral Q M,W,F-HD  . lamoTRIgine  150 mg Oral BID  . metoprolol tartrate  12.5 mg Oral BID  . multivitamin  1 tablet Oral QHS  . pantoprazole  40 mg Oral Daily    acetaminophen **OR** acetaminophen (TYLENOL) oral liquid 160 mg/5 mL **OR** acetaminophen, calcium carbonate (dosed in mg elemental calcium), camphor-menthol **AND** hydrOXYzine, docusate sodium, feeding supplement (NEPRO CARB STEADY), prochlorperazine, senna-docusate, sorbitol, zolpidem    Exam: Gen calm, , is awake and alert, following commands No jvd or bruits Chest clear bilat to bases RRR no MRG Abd soft ntnd no mass or ascites +bs Ext no LE or UE edema Neuro is alert, Ox 3 , R facial droop and R arm a bit weak at baseline AVF +bruit   Home meds:  - amlodipine 5 / metoprolol 12.5 bid  - pantoprazole 40 / cinacalcet 60 hs  - aspirin 81 qd  - acyclovir 400 hs  - clonazepam 0.5 after each HD MWF/ lacosamide 150 bid +  146m after each HD MWF/ lamotrigine 150 bid  - fluticasone- vilanterol 1 qd  - insulin lispro SSI  - prn's/ vitamins/ supplements     Outpt HD: MWF   4h  400/800   92kg   2/2 bath  AVF  Hep 2600 (NO HEP here due to SDH)  mircera 225 q2wks, last ?  calcitriol 1.75 ug tiw  venofer 50 /wk     Assessment/ Plan: 1. AMS/ acute on chronic SDH - per primary/ neuro/ NSurg 2. ESRD - on HD MWF. NO HEPARIN due to subdural bleed. HD tomorrow.  3. HTN/ vol - cont meds. euvolemic on exam. BP's ok. 8kg under dry wt,  4. Seizures - on 2 AED's and gets clonazepam & extra Vimpat after each HD as well 5. DM on insulin 6. Mult myeloma - recent stem-cell transplant plan was aborted due to SDH and bacteremia/ infection at [facility]  7. Anemia ckd - Hb 10.9, cont to follow   Rob Arjuna Doeden 06/12/2019, 3:00 PM  Iron/TIBC/Ferritin/ %Sat No results found for: IRON, TIBC, FERRITIN, IRONPCTSAT Recent Labs  Lab 06/09/19 1047  06/12/19 0441  NA 139   < > 138  K  4.7   < > 4.3  CL 101   < > 100  CO2 21*   < > 27  GLUCOSE 119*   < > 83  BUN 32*   < > 33*  CREATININE 13.08*   < > 10.13*  CALCIUM 8.9   < > 8.3*  PHOS  --    < > 2.9  ALBUMIN 3.4*   < > 3.0*  INR 1.2  --   --    < > = values in this interval not displayed.   Recent Labs  Lab 06/09/19 1047  AST 18  ALT 9  ALKPHOS 78  BILITOT 0.7  PROT 6.3*   Recent Labs  Lab 06/12/19 0441  WBC 6.1  HGB 9.4*  HCT RESULTS UNAVAILABLE DUE TO INTERFERING SUBSTANCE  PLT 297

## 2019-06-12 NOTE — Progress Notes (Signed)
Patient ID: [facility], male   DOB: 10-29-1965, 53 y.o.   MRN: [id number] BP 136/68   Pulse 76   Temp (P) 98.9 F (37.2 C)   Resp 10   Wt 83.4 kg   SpO2 95%  Alert, oriented x 4, following all commands Drift on exam, right side Right facial droop Scan without significant changes. No indications for emergent craniotomy.

## 2019-06-13 LAB — CBC
Hemoglobin: 9 g/dL — ABNORMAL LOW (ref 13.0–17.0)
Platelets: 290 10*3/uL (ref 150–400)
WBC: 5.8 10*3/uL (ref 4.0–10.5)

## 2019-06-13 LAB — GLUCOSE, CAPILLARY
Glucose-Capillary: 112 mg/dL — ABNORMAL HIGH (ref 70–99)
Glucose-Capillary: 85 mg/dL (ref 70–99)
Glucose-Capillary: 157 mg/dL — ABNORMAL HIGH (ref 70–99)

## 2019-06-13 LAB — RENAL FUNCTION PANEL
Albumin: 3.1 g/dL — ABNORMAL LOW (ref 3.5–5.0)
Anion gap: 11 (ref 5–15)
BUN: 48 mg/dL — ABNORMAL HIGH (ref 6–20)
CO2: 28 mmol/L (ref 22–32)
Calcium: 8.3 mg/dL — ABNORMAL LOW (ref 8.9–10.3)
Chloride: 100 mmol/L (ref 98–111)
Creatinine, Ser: 12.8 mg/dL — ABNORMAL HIGH (ref 0.61–1.24)
GFR calc Af Amer: 4 mL/min — ABNORMAL LOW (ref >60)
GFR calc non Af Amer: 4 mL/min — ABNORMAL LOW (ref >60)
Glucose, Bld: 139 mg/dL — ABNORMAL HIGH (ref 70–99)
Phosphorus: 3 mg/dL (ref 2.5–4.6)
Potassium: 4.9 mmol/L (ref 3.5–5.1)
Sodium: 139 mmol/L (ref 135–145)

## 2019-06-13 LAB — HEMOGLOBIN A1C
Hgb A1c MFr Bld: 4.8 % (ref 4.8–5.6)
Hgb A1c MFr Bld: 4.8 % (ref 4.8–5.6)
Mean Plasma Glucose: 91 mg/dL
Mean Plasma Glucose: 91 mg/dL

## 2019-06-13 NOTE — Progress Notes (Signed)
PROGRESS NOTE    [facility]  DTO:[id number] DOB: [birth year] DOA: 06/09/2019 PCP: [name], MD   Brief Narrative:  HPI per Dr. Vergia Alcon is a 53 y.o. male with medical history significant of CVA; seizure d/o; OSA on CPAP; PE on Coumadin; multiple myeloma; HTN; ESRD on MWF HD; DM; and afib presenting with R facial droop, R sided weakness, and slurred speech this AM.  He is unable to receive tPA due to recent SDH.  After he got home from the hospital, he was trying to get up the stairs and around the house.  He walked with a walker some but mostly sat in a chair.  He was able to swallow.  Everything was good while sleeping.  About 7AM yersterday, he took his medicine and kind of threw it up.  He noticed weakness, difficulty getting up onto to walker.  He was took weak to go to HD.  He was able to tolerate breakfast, lunch, dinner. He was able to walk but was fatigued.  This AM, he got up and took his medicine.  An hour later, he called his wife and was vomiting.  He was unable to lift his legs.  His right LE was weaker than she had ever seen it.  His speech was worse than normal - generally understandable speech but more slurred than normal. His R facial droop was more than usual.  He was admitted at [facility] from 9/17-29 for planned autologous stem cell transplant for multiple myeloma.  However, he had seizure activity following HD on 9/18, and head CT revealed an acute on chronic R-sided SDH.  He was also found to have Proteus mirabilis bacteremia (pan-sensitive), which was treated with Zosyn.  He was found to have B renal stones, but urology deferred removal in the setting of bacteremia.  He was transitioned to Cipro for continuous prophylactic therapy.  He was also noted to have aspiration PNA.  For his seizures, he was changed to Vimpat and Depabote with Klonopin after HD.  Given his multitude of medical problems, stem cell transplantation was held.   ED Course: Discharged from [facility]  Tuesday - multiple myeloma, Proteus bacteremia, seizures, SDH. He got better and was discharged Tuesday. Doing ok until yesterday - worse on the right, trouble with words. Dr. Leonel Ramsay saw him. ?change in CT. Dr. Ellene Route looked at the CT - probably needs overnight evaluation, would consider evacuation.  Assessment & Plan:   Principal Problem:   Acute on chronic intracranial subdural hematoma (HCC) Active Problems:   Multiple myeloma not having achieved remission (HCC)   ESRD on hemodialysis ([place])   Essential hypertension   DM type 2 (diabetes mellitus, type 2) (HCC)   Seizure disorder (HCC)   Aspiration pneumonia (HCC)   History of bacteremia   Atrial fibrillation, chronic (HCC)   SDH (subdural hematoma) (HCC)  1 acute/subacute/chronic SDH Questionable etiology.  Patient with prior to admission and found to have an acute on chronic right-sided SDH.  Patient had presented with worsening neurological symptoms on day of admission with concern for progressive disease/new CVA.  During prior hospitalization at [facility] patient was noted to have a 2 mm midline shift.  Head CT done on admission with increased midline shift to 4 mm.  MRI head done with right-sided subdural hematoma appearing predominantly subacute although there may be an area of more recent hemorrhage posteriorly based on high density on CT.  Fluid measures up to 14 mm in thickness in the parietal lobe and  9 mm in thickness in the frontal lobe, 5 mm midline shift to the left.  Negative for any acute infarct.  Generalized atrophy with chronic ischemia.  Patient feels right-sided weakness slowly improving.  Dysarthria improved.  Right facial weakness improved.  Repeat head CT done 06/12/2019 with stable mixed density right-sided subdural hematoma since 06/09/2019.  Stable intracranial mass-effect with leftward midline shift of 3 to 4 mm.  No new intracranial abnormality.  Chronic ischemia in the left MCA, right PCA and left cerebellar  artery territories.   Neurosurgery following.  Continue PT/OT/SLP.  CIR has been consulted to see whether patient is an appropriate candidate. Neurosurgery following and appreciate their input and recommendations.  Neurology signed off.  2.  End-stage renal disease on hemodialysis Patient being followed by nephrology and receiving hemodialysis. Continue PhosLo, Sensipar, Rena-Vite, Klonopin.  Patient for hemodialysis today.  Per nephrology.  3.  Recent aspiration pneumonia Noted on CT scan 916.  Patient was on IV Zosyn and transitioned to ciprofloxacin on 06/02/2019.  Currently on oral ciprofloxacin which we will continue for now.  4.  Seizure d/o No seizures noted.  Continue home regimen of Vimpat and Lamictal.  5.  Paroxysmal atrial fibrillation Patient with history of A. fib and PFO.  Status post IVC filter.  Patient not on anticoagulation likely not a candidate secondary to problem #1.  Patient was on aspirin which we will hold for now secondary to problem #1.  Continue metoprolol for rate control.  6.  Diabetes mellitus CBG of 112 this morning.  Hemoglobin A1c pending.  Will reorder hemoglobin A1c.  Continue sliding scale insulin.  7.  Hypertension Continue current blood pressure regimen of Norvasc, metoprolol.  8.  History of Proteus bacteremia During recent hospitalization patient noted to have a Proteus bacteremia with 1/2 cultures which were pansensitive.  Patient currently on Cipro.  Repeat blood cultures from 05/30/2019 negative-.  Repeat blood cultures ordered during this hospitalization with no growth to date. Urology deferred stone removal due to bacteremia will need outpatient follow-up with urology.  9.  Multiple myeloma Patient previously followed for MGUS but bone marrow biopsy in 2018 showed almost 20% plasma cell infiltration.  Skeletal survey negative in 10/2017.  Treatment deferred in 12/2017 due to lack of symptoms.  8/19 bone marrow biopsy with 25% abnormal plasma cells,  FISH with IgG kappa.  Skeletal survey remain negative.  Patient recently admitted to [facility] for stem cell transplant but delayed due to other medical problems that occurred while hospitalized.   DVT prophylaxis: SCDs Code Status: Full Family Communication: Updated patient.  Disposition Plan: CIR recommended per PT.     Consultants:   Neurosurgery: Dr. Ellene Route 06/09/2019  Neurology: Dr. Leonel Ramsay 06/09/2019  Procedures:   MRI brain 06/09/2019  CT head 06/09/2019  Chest x-ray 06/09/2019  Antimicrobials:   Ciprofloxacin started prior to admission.   Subjective: Patient laying in bed.  Denies any shortness of breath.  Denies any chest pain.  Feels right-sided weakness improving daily but not at baseline.  Dysarthric speech improved.    Objective: Vitals:   06/12/19 1915 06/13/19 0500 06/13/19 0605 06/13/19 0843  BP: (!) 151/89  (!) 145/78   Pulse: 80  78   Resp: (!) 21 (!) 8 (!) 23   Temp: 98 F (36.7 C)  99 F (37.2 C)   TempSrc: Oral  Oral   SpO2: 100%  97% 100%  Weight:  85 kg    Height:        Intake/Output  Summary (Last 24 hours) at 06/13/2019 0903 Last data filed at 06/12/2019 2000 Gross per 24 hour  Intake 60 ml  Output --  Net 60 ml   Filed Weights   06/10/19 1206 06/12/19 0429 06/13/19 0500  Weight: 83.6 kg 83.4 kg 85 kg    Examination:  General exam: NAD Respiratory system: Lungs clear to auscultation bilaterally.  No wheezes, no crackles, no rhonchi.  Normal respiratory effort.  Speaking in full sentences. Cardiovascular system: Regular rate and rhythm no murmurs rubs or gallops.  No JVD.  No lower extremity edema.   Gastrointestinal system: Abdomen is soft, nontender, nondistended, positive bowel sounds.  No rebound.  No guarding.  Central nervous system: Alert and oriented.  Right facial weakness improved.  Right-sided weakness improving.   Extremities: Right-sided weakness improving.  Skin: No rashes, lesions or ulcers Psychiatry: Judgement and  insight appear normal. Mood & affect appropriate.     Data Reviewed: I have personally reviewed following labs and imaging studies  CBC: Recent Labs  Lab 06/09/19 1047 06/09/19 1100 06/10/19 0810 06/11/19 0928 06/12/19 0441 06/13/19 0346  WBC 8.9  --  4.5 5.6 6.1 5.8  NEUTROABS 6.4  --   --  3.0  --   --   HGB 9.5* 10.9* 8.8* 9.0* 9.4* 9.0*  HCT 26.0* 32.0* RESULTS UNAVAILABLE DUE TO INTERFERING SUBSTANCE RESULTS UNAVAILABLE DUE TO INTERFERING SUBSTANCE RESULTS UNAVAILABLE DUE TO INTERFERING SUBSTANCE RESULTS UNAVAILABLE DUE TO INTERFERING SUBSTANCE  MCV 94.2  --  RESULTS UNAVAILABLE DUE TO INTERFERING SUBSTANCE RESULTS UNAVAILABLE DUE TO INTERFERING SUBSTANCE RESULTS UNAVAILABLE DUE TO INTERFERING SUBSTANCE RESULTS UNAVAILABLE DUE TO INTERFERING SUBSTANCE  PLT 282  --  281 289 297 188   Basic Metabolic Panel: Recent Labs  Lab 06/09/19 1047 06/09/19 1100 06/10/19 0810 06/11/19 0928 06/12/19 0441 06/13/19 0346  NA 139 138 138 138 138 139  K 4.7 4.8 4.3 4.0 4.3 4.9  CL 101 103 102 99 100 100  CO2 21*  --  24 28 27 28   GLUCOSE 119* 118* 110* 84 83 139*  BUN 32* 33* 32* 20 33* 48*  CREATININE 13.08* 12.90* 12.44* 8.56* 10.13* 12.80*  CALCIUM 8.9  --  8.6* 8.3* 8.3* 8.3*  PHOS  --   --  2.3* 2.7 2.9 3.0   GFR: Estimated Creatinine Clearance: 6.7 mL/min (A) (by C-G formula based on SCr of 12.8 mg/dL (H)). Liver Function Tests: Recent Labs  Lab 06/09/19 1047 06/10/19 0810 06/11/19 0928 06/12/19 0441 06/13/19 0346  AST 18  --   --   --   --   ALT 9  --   --   --   --   ALKPHOS 78  --   --   --   --   BILITOT 0.7  --   --   --   --   PROT 6.3*  --   --   --   --   ALBUMIN 3.4* 3.2* 3.0* 3.0* 3.1*   Recent Labs  Lab 06/09/19 1047  LIPASE 30   No results for input(s): AMMONIA in the last 168 hours. Coagulation Profile: Recent Labs  Lab 06/09/19 1047  INR 1.2   Cardiac Enzymes: No results for input(s): CKTOTAL, CKMB, CKMBINDEX, TROPONINI in the last 168  hours. BNP (last 3 results) No results for input(s): PROBNP in the last 8760 hours. HbA1C: No results for input(s): HGBA1C in the last 72 hours. CBG: Recent Labs  Lab 06/12/19 0559 06/12/19 1120 06/12/19 1618 06/12/19 2051  06/13/19 0603  GLUCAP 93 84 153* 117* 112*   Lipid Profile: No results for input(s): CHOL, HDL, LDLCALC, TRIG, CHOLHDL, LDLDIRECT in the last 72 hours. Thyroid Function Tests: No results for input(s): TSH, T4TOTAL, FREET4, T3FREE, THYROIDAB in the last 72 hours. Anemia Panel: No results for input(s): VITAMINB12, FOLATE, FERRITIN, TIBC, IRON, RETICCTPCT in the last 72 hours. Sepsis Labs: Recent Labs  Lab 06/09/19 1414  LATICACIDVEN 0.8    Recent Results (from the past 240 hour(s))  SARS Coronavirus 2 [facility] order, Performed in [name] hospital lab) Nasopharyngeal Nasopharyngeal Swab     Status: None   Collection Time: 06/09/19 10:47 AM   Specimen: Nasopharyngeal Swab  Result Value Ref Range Status   SARS Coronavirus 2 NEGATIVE NEGATIVE Final    Comment: (NOTE) If result is NEGATIVE SARS-CoV-2 target nucleic acids are NOT DETECTED. The SARS-CoV-2 RNA is generally detectable in upper and lower  respiratory specimens during the acute phase of infection. The lowest  concentration of SARS-CoV-2 viral copies this assay can detect is 250  copies / mL. A negative result does not preclude SARS-CoV-2 infection  and should not be used as the sole basis for treatment or other  patient management decisions.  A negative result may occur with  improper specimen collection / handling, submission of specimen other  than nasopharyngeal swab, presence of viral mutation(s) within the  areas targeted by this assay, and inadequate number of viral copies  (<250 copies / mL). A negative result must be combined with clinical  observations, patient history, and epidemiological information. If result is POSITIVE SARS-CoV-2 target nucleic acids are DETECTED. The  SARS-CoV-2 RNA is generally detectable in upper and lower  respiratory specimens dur ing the acute phase of infection.  Positive  results are indicative of active infection with SARS-CoV-2.  Clinical  correlation with patient history and other diagnostic information is  necessary to determine patient infection status.  Positive results do  not rule out bacterial infection or co-infection with other viruses. If result is PRESUMPTIVE POSTIVE SARS-CoV-2 nucleic acids MAY BE PRESENT.   A presumptive positive result was obtained on the submitted specimen  and confirmed on repeat testing.  While 2019 novel coronavirus  (SARS-CoV-2) nucleic acids may be present in the submitted sample  additional confirmatory testing may be necessary for epidemiological  and / or clinical management purposes  to differentiate between  SARS-CoV-2 and other Sarbecovirus currently known to infect humans.  If clinically indicated additional testing with an alternate test  methodology [id number]) is advised. The SARS-CoV-2 RNA is generally  detectable in upper and lower respiratory sp ecimens during the acute  phase of infection. The expected result is Negative. Fact Sheet for Patients:  StrictlyIdeas.no Fact Sheet for Healthcare Providers: BankingDealers.co.za This test is not yet approved or cleared by the [place] FDA and has been authorized for detection and/or diagnosis of SARS-CoV-2 by FDA under an Emergency Use Authorization (EUA).  This EUA will remain in effect (meaning this test can be used) for the duration of the COVID-19 declaration under Section 564(b)(1) of the Act, 21 U.S.C. section 360bbb-3(b)(1), unless the authorization is terminated or revoked sooner. Performed at [facility], [place] [address] [place], [place] [address]   Blood culture (routine x 2)     Status: None (Preliminary result)   Collection Time: 06/09/19 10:47 AM   Specimen:  BLOOD  Result Value Ref Range Status   Specimen Description BLOOD UNKNOWN  Final   Special Requests  Final    BOTTLES DRAWN AEROBIC AND ANAEROBIC Blood Culture results may not be optimal due to an inadequate volume of blood received in culture bottles   Culture   Final    NO GROWTH 4 DAYS Performed at [facility], [place] [address], [place], [place] [address]    Report Status PENDING  Incomplete         Radiology Studies: Ct Head Wo Contrast  Result Date: 06/12/2019 CLINICAL DATA:  53 year old male with multiple myeloma. Right side subdural hematoma. EXAM: CT HEAD WITHOUT CONTRAST TECHNIQUE: Contiguous axial images were obtained from the base of the skull through the vertex without intravenous contrast. COMPARISON:  Brain MRI and head CT 06/09/2019. FINDINGS: Brain: Mixed density right side subdural hematoma measures up to 14-15 millimeters in thickness and appears unchanged since 06/09/2019, including several curvilinear areas of hyperdense blood within the background of hypodense subdural. Smaller more hyperdense right tentorial and right occipital pole component is also stable. Leftward midline shift of 3-4 millimeters is stable. Basilar cisterns remain patent. No ventriculomegaly no new intracranial hemorrhage identified. Chronic encephalomalacia in the left MCA, right PCA, and left cerebellar artery territories. Stable gray-white matter differentiation throughout the brain. No cortically based acute infarct identified. Vascular: Calcified atherosclerosis at the skull base. No suspicious intracranial vascular hyperdensity. Skull: No acute osseous abnormality identified. Sinuses/Orbits: Left maxillary sinus mucous retention cyst. Other Visualized paranasal sinuses and mastoids are stable and well pneumatized. Other: No acute orbit or scalp soft tissue findings. Left occipital region scalp sebaceous cyst. IMPRESSION: 1. Stable mixed density right side subdural hematoma since 06/09/2019.  Stable intracranial mass effect with leftward midline shift of 3-4 mm. 2. No new intracranial abnormality. 3. Chronic ischemia in the left MCA, right PCA, and left cerebellar artery territories. Electronically Signed   By: Genevie Ann M.D.   On: 06/12/2019 09:54        Scheduled Meds:   stroke: mapping our early stages of recovery book   Does not apply Once   acyclovir  400 mg Oral QHS   amLODipine  5 mg Oral Daily   calcium acetate  2,001 mg Oral TID WC   calcium carbonate  2 tablet Oral QHS   Chlorhexidine Gluconate Cloth  6 each Topical Q0600   cinacalcet  60 mg Oral QPM   ciprofloxacin  500 mg Oral QPM   clonazePAM  0.5 mg Oral Q M,W,F-HD   feeding supplement (PRO-STAT SUGAR FREE 64)  30 mL Oral TID BM   fluticasone furoate-vilanterol  1 puff Inhalation Daily   insulin aspart  0-5 Units Subcutaneous QHS   insulin aspart  0-9 Units Subcutaneous TID WC   lacosamide  150 mg Oral BID   lacosamide  150 mg Oral Q M,W,F-HD   lamoTRIgine  150 mg Oral BID   metoprolol tartrate  12.5 mg Oral BID   multivitamin  1 tablet Oral QHS   pantoprazole  40 mg Oral Daily   Continuous Infusions:   LOS: 4 days    Time spent: 35 minutes    Irine Seal, MD [facility]  If 7PM-7AM, please contact night-coverage [email or web address] 06/13/2019, 9:03 AM

## 2019-06-13 NOTE — Progress Notes (Signed)
Physical Therapy Cancellation Note   06/13/19 1325  PT Visit Information  Last PT Received On 06/13/19  Reason Eval/Treat Not Completed Patient at procedure or test/unavailable. Pt in HD. PT will continue to follow acutely.   Earney Navy, PTA Acute Rehabilitation Services Pager: [phone] Office: [phone]

## 2019-06-13 NOTE — Consult Note (Signed)
Inpatient Rehabilitation Admissions Coordinator  Inpatient rehab consult received. I met with patient at bedside for rehab assessment.  Pt previously at [facility] 10/2015 after CVA. He prefers an inpt rehab admit . I contacted his wife, [name] by phone. We discussed goals and expectations of an inpt rehab admit and she is in agreement. I will follow up tomorrow to clarify when bed will be available for admission.  Danne Baxter, RN, MSN Rehab Admissions Coordinator [phone] 06/13/2019 10:57 AM

## 2019-06-13 NOTE — Progress Notes (Signed)
Patient ID: [facility], male   DOB: 1966/06/05, 53 y.o.   MRN: [id number] Vital signs are stable Patient is awake alert right-sided weakness is unchanged He is sitting up in a chair He is a candidate for comprehensive inpatient rehabilitation this application is being made I have discussed this with the patient's wife today at the bedside I note that his CT shows that the subdural is no worse and we have held off on any consideration of drainage which I do not believe is necessary at this time She will try to procure the films from [facility] I agree that patient is a good candidate for CIR

## 2019-06-13 NOTE — Care Management Important Message (Signed)
Important Message  Patient Details  Name: Isaiah [name] MRN: [id number] Date of Birth: [birth year]   Medicare Important Message Given:  Yes     Shelda Altes 06/13/2019, 2:13 PM

## 2019-06-13 NOTE — Progress Notes (Signed)
[facility] Progress Note   Assessment/ Plan:   Outpt HD:MWF  4h 400/800 92kg 2/2 bath  AVFHep 2600 (NO HEP here due to SDH) mircera 225 q2wks, last ? calcitriol 1.75 ug tiw venofer 50 /wk    Assessment/ Plan: 1. AMS/ acute on chronic SDH - per primary/ neuro/ NSurg.  No surgical intervention needed at present per notes. 2. ESRD - on HD MWF. NO HEPARIN due to subdural bleed. HD today 10/5 3. HTN/ vol - cont meds. euvolemic on exam. BP's ok. 8kg under dry wt,  4. Seizures - on 2 AED's and gets clonazepam & extra Vimpat after each HD as well 5. DM on insulin 6. Mult myeloma - recent stem-cell transplant plan was aborted due to SDH and bacteremia/ infection at [facility]  7. Anemia ckd - Hb 10.9, cont to follow 8. Dispo: possible CIR  Subjective:    NAD, sitting in bed   Objective:   BP (!) 145/78 (BP Location: Left Wrist)   Pulse 78   Temp 99 F (37.2 C) (Oral)   Resp (!) 23   Ht 5' 10"  (1.778 m)   Wt 85 kg   SpO2 100%   BMI 26.87 kg/m   Physical Exam: Gen: NAD CVS:RRR Resp:clear anteriorly Abd: obese, NABS Ext: trace LE Edema NEURO: R facial dropp ACCESS: R FA AVF  Labs: BMET Recent Labs  Lab 06/09/19 1047 06/09/19 1100 06/10/19 0810 06/11/19 0928 06/12/19 0441 06/13/19 0346  NA 139 138 138 138 138 139  K 4.7 4.8 4.3 4.0 4.3 4.9  CL 101 103 102 99 100 100  CO2 21*  --  24 28 27 28   GLUCOSE 119* 118* 110* 84 83 139*  BUN 32* 33* 32* 20 33* 48*  CREATININE 13.08* 12.90* 12.44* 8.56* 10.13* 12.80*  CALCIUM 8.9  --  8.6* 8.3* 8.3* 8.3*  PHOS  --   --  2.3* 2.7 2.9 3.0   CBC Recent Labs  Lab 06/09/19 1047  06/10/19 0810 06/11/19 0928 06/12/19 0441 06/13/19 0346  WBC 8.9  --  4.5 5.6 6.1 5.8  NEUTROABS 6.4  --   --  3.0  --   --   HGB 9.5*   < > 8.8* 9.0* 9.4* 9.0*  HCT 26.0*   < > RESULTS UNAVAILABLE DUE TO INTERFERING SUBSTANCE RESULTS UNAVAILABLE DUE TO INTERFERING SUBSTANCE RESULTS UNAVAILABLE DUE TO INTERFERING  SUBSTANCE RESULTS UNAVAILABLE DUE TO INTERFERING SUBSTANCE  MCV 94.2  --  RESULTS UNAVAILABLE DUE TO INTERFERING SUBSTANCE RESULTS UNAVAILABLE DUE TO INTERFERING SUBSTANCE RESULTS UNAVAILABLE DUE TO INTERFERING SUBSTANCE RESULTS UNAVAILABLE DUE TO INTERFERING SUBSTANCE  PLT 282  --  281 289 297 290   < > = values in this interval not displayed.    @IMGRELPRIORS @ Medications:    .  stroke: mapping our early stages of recovery book   Does not apply Once  . acyclovir  400 mg Oral QHS  . amLODipine  5 mg Oral Daily  . calcium acetate  2,001 mg Oral TID WC  . calcium carbonate  2 tablet Oral QHS  . Chlorhexidine Gluconate Cloth  6 each Topical Q0600  . cinacalcet  60 mg Oral QPM  . ciprofloxacin  500 mg Oral QPM  . clonazePAM  0.5 mg Oral Q M,W,F-HD  . feeding supplement (PRO-STAT SUGAR FREE 64)  30 mL Oral TID BM  . fluticasone furoate-vilanterol  1 puff Inhalation Daily  . insulin aspart  0-5 Units Subcutaneous QHS  . insulin aspart  0-9 Units Subcutaneous TID WC  . lacosamide  150 mg Oral BID  . lacosamide  150 mg Oral Q M,W,F-HD  . lamoTRIgine  150 mg Oral BID  . metoprolol tartrate  12.5 mg Oral BID  . multivitamin  1 tablet Oral QHS  . pantoprazole  40 mg Oral Daily     Madelon Lips, MD 06/13/2019, 10:35 AM

## 2019-06-14 ENCOUNTER — Inpatient Hospital Stay (HOSPITAL_COMMUNITY)
Admission: RE | Admit: 2019-06-14 | Payer: Medicare Other | Source: Intra-hospital | Admitting: Physical Medicine & Rehabilitation

## 2019-06-14 ENCOUNTER — Inpatient Hospital Stay (HOSPITAL_COMMUNITY)
Admission: RE | Admit: 2019-06-14 | Discharge: 2019-06-30 | DRG: 056 | Disposition: A | Discharge status: Home Health | Payer: Medicare Other | Source: Intra-hospital | Attending: Physical Medicine & Rehabilitation | Admitting: Physical Medicine & Rehabilitation

## 2019-06-14 ENCOUNTER — Encounter: Payer: Self-pay | Admitting: Internal Medicine

## 2019-06-14 ENCOUNTER — Other Ambulatory Visit: Payer: Self-pay

## 2019-06-14 ENCOUNTER — Encounter (HOSPITAL_COMMUNITY): Payer: Self-pay | Admitting: Physical Medicine and Rehabilitation

## 2019-06-14 ENCOUNTER — Encounter (HOSPITAL_COMMUNITY): Payer: Self-pay

## 2019-06-14 DIAGNOSIS — M109 Gout, unspecified: Secondary | ICD-10-CM | POA: Diagnosis present

## 2019-06-14 DIAGNOSIS — I69151 Hemiplegia and hemiparesis following nontraumatic intracerebral hemorrhage affecting right dominant side: Secondary | ICD-10-CM | POA: Diagnosis not present

## 2019-06-14 DIAGNOSIS — R7881 Bacteremia: Secondary | ICD-10-CM | POA: Diagnosis present

## 2019-06-14 DIAGNOSIS — Z7189 Other specified counseling: Secondary | ICD-10-CM

## 2019-06-14 DIAGNOSIS — E11649 Type 2 diabetes mellitus with hypoglycemia without coma: Secondary | ICD-10-CM | POA: Diagnosis not present

## 2019-06-14 DIAGNOSIS — N186 End stage renal disease: Secondary | ICD-10-CM | POA: Diagnosis not present

## 2019-06-14 DIAGNOSIS — I69122 Dysarthria following nontraumatic intracerebral hemorrhage: Secondary | ICD-10-CM | POA: Diagnosis not present

## 2019-06-14 DIAGNOSIS — Z86711 Personal history of pulmonary embolism: Secondary | ICD-10-CM | POA: Diagnosis not present

## 2019-06-14 DIAGNOSIS — Z8249 Family history of ischemic heart disease and other diseases of the circulatory system: Secondary | ICD-10-CM

## 2019-06-14 DIAGNOSIS — R29810 Facial weakness: Secondary | ICD-10-CM | POA: Diagnosis present

## 2019-06-14 DIAGNOSIS — S065XAA Traumatic subdural hemorrhage with loss of consciousness status unknown, initial encounter: Secondary | ICD-10-CM | POA: Diagnosis present

## 2019-06-14 DIAGNOSIS — I10 Essential (primary) hypertension: Secondary | ICD-10-CM | POA: Diagnosis not present

## 2019-06-14 DIAGNOSIS — R0989 Other specified symptoms and signs involving the circulatory and respiratory systems: Secondary | ICD-10-CM | POA: Diagnosis not present

## 2019-06-14 DIAGNOSIS — R627 Adult failure to thrive: Secondary | ICD-10-CM | POA: Diagnosis present

## 2019-06-14 DIAGNOSIS — D5740 Sickle-cell thalassemia without crisis: Secondary | ICD-10-CM | POA: Diagnosis present

## 2019-06-14 DIAGNOSIS — Z794 Long term (current) use of insulin: Secondary | ICD-10-CM | POA: Diagnosis not present

## 2019-06-14 DIAGNOSIS — I480 Paroxysmal atrial fibrillation: Secondary | ICD-10-CM | POA: Diagnosis not present

## 2019-06-14 DIAGNOSIS — Q211 Atrial septal defect: Secondary | ICD-10-CM

## 2019-06-14 DIAGNOSIS — Z823 Family history of stroke: Secondary | ICD-10-CM | POA: Diagnosis not present

## 2019-06-14 DIAGNOSIS — Z885 Allergy status to narcotic agent status: Secondary | ICD-10-CM

## 2019-06-14 DIAGNOSIS — I120 Hypertensive chronic kidney disease with stage 5 chronic kidney disease or end stage renal disease: Secondary | ICD-10-CM | POA: Diagnosis present

## 2019-06-14 DIAGNOSIS — Z7982 Long term (current) use of aspirin: Secondary | ICD-10-CM

## 2019-06-14 DIAGNOSIS — Z992 Dependence on renal dialysis: Secondary | ICD-10-CM

## 2019-06-14 DIAGNOSIS — I69351 Hemiplegia and hemiparesis following cerebral infarction affecting right dominant side: Secondary | ICD-10-CM

## 2019-06-14 DIAGNOSIS — D631 Anemia in chronic kidney disease: Secondary | ICD-10-CM | POA: Diagnosis not present

## 2019-06-14 DIAGNOSIS — D638 Anemia in other chronic diseases classified elsewhere: Secondary | ICD-10-CM | POA: Diagnosis not present

## 2019-06-14 DIAGNOSIS — Z832 Family history of diseases of the blood and blood-forming organs and certain disorders involving the immune mechanism: Secondary | ICD-10-CM | POA: Diagnosis not present

## 2019-06-14 DIAGNOSIS — Z7951 Long term (current) use of inhaled steroids: Secondary | ICD-10-CM

## 2019-06-14 DIAGNOSIS — R29818 Other symptoms and signs involving the nervous system: Secondary | ICD-10-CM | POA: Diagnosis not present

## 2019-06-14 DIAGNOSIS — N2581 Secondary hyperparathyroidism of renal origin: Secondary | ICD-10-CM | POA: Diagnosis present

## 2019-06-14 DIAGNOSIS — R569 Unspecified convulsions: Secondary | ICD-10-CM | POA: Diagnosis not present

## 2019-06-14 DIAGNOSIS — R7309 Other abnormal glucose: Secondary | ICD-10-CM | POA: Diagnosis not present

## 2019-06-14 DIAGNOSIS — E1122 Type 2 diabetes mellitus with diabetic chronic kidney disease: Secondary | ICD-10-CM | POA: Diagnosis present

## 2019-06-14 DIAGNOSIS — E1165 Type 2 diabetes mellitus with hyperglycemia: Secondary | ICD-10-CM | POA: Diagnosis not present

## 2019-06-14 DIAGNOSIS — C9000 Multiple myeloma not having achieved remission: Secondary | ICD-10-CM | POA: Diagnosis present

## 2019-06-14 DIAGNOSIS — S065X9A Traumatic subdural hemorrhage with loss of consciousness of unspecified duration, initial encounter: Secondary | ICD-10-CM | POA: Diagnosis not present

## 2019-06-14 DIAGNOSIS — R531 Weakness: Secondary | ICD-10-CM

## 2019-06-14 DIAGNOSIS — G4733 Obstructive sleep apnea (adult) (pediatric): Secondary | ICD-10-CM | POA: Diagnosis present

## 2019-06-14 DIAGNOSIS — B964 Proteus (mirabilis) (morganii) as the cause of diseases classified elsewhere: Secondary | ICD-10-CM | POA: Diagnosis present

## 2019-06-14 DIAGNOSIS — N200 Calculus of kidney: Secondary | ICD-10-CM | POA: Diagnosis present

## 2019-06-14 DIAGNOSIS — G40909 Epilepsy, unspecified, not intractable, without status epilepticus: Secondary | ICD-10-CM | POA: Diagnosis present

## 2019-06-14 DIAGNOSIS — I6200 Nontraumatic subdural hemorrhage, unspecified: Secondary | ICD-10-CM | POA: Diagnosis not present

## 2019-06-14 LAB — RENAL FUNCTION PANEL
Albumin: 3 g/dL — ABNORMAL LOW (ref 3.5–5.0)
Anion gap: 10 (ref 5–15)
BUN: 23 mg/dL — ABNORMAL HIGH (ref 6–20)
CO2: 28 mmol/L (ref 22–32)
Calcium: 8.1 mg/dL — ABNORMAL LOW (ref 8.9–10.3)
Chloride: 98 mmol/L (ref 98–111)
Creatinine, Ser: 7.5 mg/dL — ABNORMAL HIGH (ref 0.61–1.24)
GFR calc Af Amer: 9 mL/min — ABNORMAL LOW (ref >60)
GFR calc non Af Amer: 7 mL/min — ABNORMAL LOW (ref >60)
Glucose, Bld: 107 mg/dL — ABNORMAL HIGH (ref 70–99)
Phosphorus: 2.6 mg/dL (ref 2.5–4.6)
Potassium: 4.2 mmol/L (ref 3.5–5.1)
Sodium: 136 mmol/L (ref 135–145)

## 2019-06-14 LAB — GLUCOSE, CAPILLARY
Glucose-Capillary: 106 mg/dL — ABNORMAL HIGH (ref 70–99)
Glucose-Capillary: 111 mg/dL — ABNORMAL HIGH (ref 70–99)
Glucose-Capillary: 154 mg/dL — ABNORMAL HIGH (ref 70–99)
Glucose-Capillary: 133 mg/dL — ABNORMAL HIGH (ref 70–99)
Glucose-Capillary: 116 mg/dL — ABNORMAL HIGH (ref 70–99)

## 2019-06-14 LAB — HEMOGLOBIN AND HEMATOCRIT, BLOOD: Hemoglobin: 8.9 g/dL — ABNORMAL LOW (ref 13.0–17.0)

## 2019-06-14 LAB — CULTURE, BLOOD (ROUTINE X 2)
Culture: NO GROWTH
Culture: NO GROWTH

## 2019-06-14 MED ORDER — PRO-STAT SUGAR FREE PO LIQD
30.0000 mL | Freq: Three times a day (TID) | ORAL | Status: DC
  Filled 2019-06-14: qty 30

## 2019-06-14 MED ORDER — CAMPHOR-MENTHOL 0.5-0.5 % EX LOTN
1.0000 "application " | TOPICAL_LOTION | Freq: Three times a day (TID) | CUTANEOUS | Status: DC | PRN
  Filled 2019-06-14: qty 222

## 2019-06-14 MED ORDER — ZOLPIDEM TARTRATE 5 MG PO TABS
5.0000 mg | ORAL_TABLET | Freq: Every evening | ORAL | Status: DC | PRN
  Filled 2019-06-14: qty 1

## 2019-06-14 MED ORDER — GUAIFENESIN-DM 100-10 MG/5ML PO SYRP: 5.0000 mL | ORAL_SOLUTION | Freq: Four times a day (QID) | ORAL | Status: DC | PRN

## 2019-06-14 MED ORDER — TRAZODONE HCL 50 MG PO TABS
25.0000 mg | ORAL_TABLET | Freq: Every evening | ORAL | Status: DC | PRN
  Filled 2019-06-14: qty 1

## 2019-06-14 MED ORDER — CALCIUM CARBONATE ANTACID 500 MG PO CHEW
2.0000 | CHEWABLE_TABLET | Freq: Every day | ORAL | Status: DC
  Administered 2019-06-14 – 2019-06-29 (×9): 400 mg via ORAL
  Filled 2019-06-14 (×16): qty 2

## 2019-06-14 MED ORDER — PROCHLORPERAZINE 25 MG RE SUPP: 12.5000 mg | Freq: Four times a day (QID) | RECTAL | Status: DC | PRN

## 2019-06-14 MED ORDER — CHLORHEXIDINE GLUCONATE CLOTH 2 % EX PADS: 6.0000 | MEDICATED_PAD | Freq: Every day | CUTANEOUS | Status: DC

## 2019-06-14 MED ORDER — INSULIN ASPART 100 UNIT/ML ~~LOC~~ SOLN
0.0000 [IU] | Freq: Three times a day (TID) | SUBCUTANEOUS | Status: DC
  Administered 2019-06-19: 2 [IU] via SUBCUTANEOUS
  Administered 2019-06-21 – 2019-06-26 (×2): 1 [IU] via SUBCUTANEOUS
  Administered 2019-06-26: 2 [IU] via SUBCUTANEOUS
  Administered 2019-06-27: 1 [IU] via SUBCUTANEOUS

## 2019-06-14 MED ORDER — CINACALCET HCL 30 MG PO TABS
60.0000 mg | ORAL_TABLET | Freq: Every evening | ORAL | Status: DC
  Administered 2019-06-15 – 2019-06-29 (×15): 60 mg via ORAL
  Filled 2019-06-14 (×16): qty 2

## 2019-06-14 MED ORDER — SENNOSIDES-DOCUSATE SODIUM 8.6-50 MG PO TABS: 1.0000 | ORAL_TABLET | Freq: Every evening | ORAL | Status: DC | PRN

## 2019-06-14 MED ORDER — PROCHLORPERAZINE EDISYLATE 10 MG/2ML IJ SOLN: 5.0000 mg | Freq: Four times a day (QID) | INTRAMUSCULAR | Status: DC | PRN

## 2019-06-14 MED ORDER — PROCHLORPERAZINE MALEATE 5 MG PO TABS: 10.0000 mg | ORAL_TABLET | Freq: Four times a day (QID) | ORAL | Status: DC | PRN

## 2019-06-14 MED ORDER — SORBITOL 70 % SOLN
30.0000 mL | Status: DC | PRN
  Filled 2019-06-14 (×2): qty 30

## 2019-06-14 MED ORDER — DIPHENHYDRAMINE HCL 12.5 MG/5ML PO ELIX: 12.5000 mg | ORAL_SOLUTION | Freq: Four times a day (QID) | ORAL | Status: DC | PRN

## 2019-06-14 MED ORDER — RENA-VITE PO TABS
1.0000 | ORAL_TABLET | Freq: Every day | ORAL | Status: DC
  Administered 2019-06-14 – 2019-06-29 (×16): 1 via ORAL
  Filled 2019-06-14 (×16): qty 1

## 2019-06-14 MED ORDER — LAMOTRIGINE 100 MG PO TABS
150.0000 mg | ORAL_TABLET | Freq: Two times a day (BID) | ORAL | Status: DC
  Administered 2019-06-14 – 2019-06-30 (×32): 150 mg via ORAL
  Filled 2019-06-14 (×33): qty 2

## 2019-06-14 MED ORDER — CLONAZEPAM 0.5 MG PO TABS
0.5000 mg | ORAL_TABLET | ORAL | Status: DC
  Administered 2019-06-15 – 2019-06-27 (×6): 0.5 mg via ORAL
  Filled 2019-06-14 (×5): qty 1

## 2019-06-14 MED ORDER — ACETAMINOPHEN 325 MG PO TABS: 325.0000 mg | ORAL_TABLET | ORAL | Status: DC | PRN

## 2019-06-14 MED ORDER — HYDROXYZINE HCL 25 MG PO TABS
25.0000 mg | ORAL_TABLET | Freq: Three times a day (TID) | ORAL | Status: DC | PRN
  Administered 2019-06-16: 25 mg via ORAL
  Filled 2019-06-14 (×2): qty 1

## 2019-06-14 MED ORDER — NEPRO/CARBSTEADY PO LIQD: 237.0000 mL | Freq: Three times a day (TID) | ORAL | Status: DC | PRN

## 2019-06-14 MED ORDER — INSULIN ASPART 100 UNIT/ML ~~LOC~~ SOLN: 0.0000 [IU] | Freq: Every day | SUBCUTANEOUS | Status: DC

## 2019-06-14 MED ORDER — LACOSAMIDE 50 MG PO TABS
150.0000 mg | ORAL_TABLET | Freq: Two times a day (BID) | ORAL | Status: DC
  Administered 2019-06-14 – 2019-06-30 (×31): 150 mg via ORAL
  Filled 2019-06-14 (×34): qty 3

## 2019-06-14 MED ORDER — BISACODYL 10 MG RE SUPP: 10.0000 mg | Freq: Every day | RECTAL | Status: DC | PRN

## 2019-06-14 MED ORDER — ACYCLOVIR 400 MG PO TABS
400.0000 mg | ORAL_TABLET | Freq: Every day | ORAL | Status: DC
  Administered 2019-06-14 – 2019-06-29 (×16): 400 mg via ORAL
  Filled 2019-06-14 (×18): qty 1

## 2019-06-14 MED ORDER — LACOSAMIDE 50 MG PO TABS
150.0000 mg | ORAL_TABLET | ORAL | Status: DC
  Administered 2019-06-15 – 2019-06-29 (×9): 150 mg via ORAL
  Filled 2019-06-14 (×8): qty 3

## 2019-06-14 MED ORDER — CALCIUM CARBONATE ANTACID 1250 MG/5ML PO SUSP: 500.0000 mg | Freq: Four times a day (QID) | ORAL | Status: DC | PRN

## 2019-06-14 MED ORDER — METOPROLOL TARTRATE 12.5 MG HALF TABLET
12.5000 mg | ORAL_TABLET | Freq: Two times a day (BID) | ORAL | Status: DC
  Administered 2019-06-14 – 2019-06-30 (×26): 12.5 mg via ORAL
  Filled 2019-06-14 (×27): qty 1

## 2019-06-14 MED ORDER — DOCUSATE SODIUM 283 MG RE ENEM
1.0000 | ENEMA | RECTAL | Status: DC | PRN
  Filled 2019-06-14: qty 1

## 2019-06-14 MED ORDER — CALCIUM ACETATE (PHOS BINDER) 667 MG PO CAPS
2001.0000 mg | ORAL_CAPSULE | Freq: Three times a day (TID) | ORAL | Status: DC
  Administered 2019-06-14 – 2019-06-20 (×19): 2001 mg via ORAL
  Filled 2019-06-14 (×23): qty 3

## 2019-06-14 MED ORDER — PANTOPRAZOLE SODIUM 40 MG PO TBEC
40.0000 mg | DELAYED_RELEASE_TABLET | Freq: Every day | ORAL | Status: DC
  Administered 2019-06-15 – 2019-06-30 (×16): 40 mg via ORAL
  Filled 2019-06-14 (×16): qty 1

## 2019-06-14 MED ORDER — FLUTICASONE FUROATE-VILANTEROL 200-25 MCG/INH IN AEPB
1.0000 | INHALATION_SPRAY | Freq: Every day | RESPIRATORY_TRACT | Status: DC
  Administered 2019-06-15 – 2019-06-27 (×13): 1 via RESPIRATORY_TRACT
  Filled 2019-06-14: qty 28

## 2019-06-14 MED ORDER — AMLODIPINE BESYLATE 5 MG PO TABS: 5.0000 mg | ORAL_TABLET | Freq: Every day | ORAL | Status: DC

## 2019-06-14 MED ORDER — SIMETHICONE 80 MG PO CHEW: 80.0000 mg | CHEWABLE_TABLET | Freq: Four times a day (QID) | ORAL | Status: DC | PRN

## 2019-06-14 MED ORDER — PROCHLORPERAZINE MALEATE 5 MG PO TABS
5.0000 mg | ORAL_TABLET | Freq: Four times a day (QID) | ORAL | Status: DC | PRN
  Administered 2019-06-19 – 2019-06-20 (×2): 5 mg via ORAL
  Filled 2019-06-14 (×2): qty 1

## 2019-06-14 MED ORDER — CIPROFLOXACIN HCL 500 MG PO TABS
500.0000 mg | ORAL_TABLET | Freq: Every evening | ORAL | Status: DC
  Administered 2019-06-15 – 2019-06-29 (×15): 500 mg via ORAL
  Filled 2019-06-14 (×16): qty 1

## 2019-06-14 MED ORDER — CALCIUM CARBONATE ANTACID 500 MG PO CHEW
1.0000 | CHEWABLE_TABLET | Freq: Three times a day (TID) | ORAL | Status: DC | PRN
  Administered 2019-06-22: 200 mg via ORAL
  Filled 2019-06-14: qty 1

## 2019-06-14 NOTE — Progress Notes (Signed)
Patient admitted at 1630 to 60M04. Patient alert and oriented x4. Patient given rehab notebook, educated on fall safety precautions. All questions answered. Call bell and belongings within reach.

## 2019-06-14 NOTE — TOC Transition Note (Signed)
Transition of Care [facility]) - CM/SW Discharge Note Marvetta Gibbons RN, BSN Transitions of Care Unit 4E- RN Case Manager [phone]   Patient Details  Name: Isaiah [name] MRN: [id number] Date of Birth: [birth year]  Transition of Care [facility]) CM/SW Contact:  Dawayne Patricia, RN Phone Number: 06/14/2019, 2:42 PM   Clinical Narrative:    Pt stable for transition today to INPT rehab at [facility]. AC with CIR has confirmed bed available and plan to admit later today.    Final next level of care: IP Rehab Facility Barriers to Discharge: No Barriers Identified   Patient Goals and CMS Choice Patient states their goals for this hospitalization and ongoing recovery are:: rehab CMS Medicare.gov Compare Post Acute Care list provided to:: Patient Choice offered to / list presented to : Patient  Discharge Placement        [facility] INPT rehab               Discharge Plan and Services                DME Arranged: N/A DME Agency: NA       HH Arranged: NA HH Agency: NA        Social Determinants of Health (SDOH) Interventions     Readmission Risk Interventions Readmission Risk Prevention Plan 06/14/2019  [place] or Home Care Consult Complete  Social Work Consult for [place] Planning/Counseling Complete  Palliative Care Screening Not Applicable

## 2019-06-14 NOTE — Progress Notes (Signed)
[facility] Progress Note   Assessment/ Plan:   Outpt HD:MWF  4h 400/800 92kg 2/2 bath  AVFHep 2600 (NO HEP here due to SDH) mircera 225 q2wks,  calcitriol 1.75 ug tiw venofer 50 /wk    Assessment/ Plan: 1. AMS/ acute on chronic SDH - per primary/ neuro/ NSurg.  No surgical intervention needed at present per notes. 2. ESRD - on HD MWF. NO HEPARIN due to subdural bleed. HD 10/5, next planned HD 10/7 3. HTN/ vol - cont meds. euvolemic on exam. BP's ok. 8kg under dry wt,  4. Seizures - on 2 AED's and gets clonazepam & extra Vimpat after each HD as well 5. DM on insulin 6. Mult myeloma - recent stem-cell transplant plan was aborted due to SDH and bacteremia/ infection at [facility]  7. Anemia ckd - Hb 10.9, cont to follow 8. Dispo: CIR today  Subjective:    Per notes, to CIR today.  Excited.     Objective:   BP 126/69 (BP Location: Left Wrist)   Pulse 72   Temp 98.7 F (37.1 C) (Oral)   Resp 11   Ht 5' 10"  (1.778 m)   Wt 84 kg   SpO2 98%   BMI 26.57 kg/m   Physical Exam: Gen: NAD CVS:RRR Resp:clear anteriorly Abd: obese, NABS Ext: trace LE Edema NEURO: R facial dropp ACCESS: R FA AVF  Labs: BMET Recent Labs  Lab 06/09/19 1047 06/09/19 1100 06/10/19 0810 06/11/19 0928 06/12/19 0441 06/13/19 0346 06/14/19 0247  NA 139 138 138 138 138 139 136  K 4.7 4.8 4.3 4.0 4.3 4.9 4.2  CL 101 103 102 99 100 100 98  CO2 21*  --  24 28 27 28 28   GLUCOSE 119* 118* 110* 84 83 139* 107*  BUN 32* 33* 32* 20 33* 48* 23*  CREATININE 13.08* 12.90* 12.44* 8.56* 10.13* 12.80* 7.50*  CALCIUM 8.9  --  8.6* 8.3* 8.3* 8.3* 8.1*  PHOS  --   --  2.3* 2.7 2.9 3.0 2.6   CBC Recent Labs  Lab 06/09/19 1047  06/10/19 0810 06/11/19 0928 06/12/19 0441 06/13/19 0346 06/14/19 0247  WBC 8.9  --  4.5 5.6 6.1 5.8  --   NEUTROABS 6.4  --   --  3.0  --   --   --   HGB 9.5*   < > 8.8* 9.0* 9.4* 9.0* 8.9*  HCT 26.0*   < > RESULTS UNAVAILABLE DUE TO INTERFERING  SUBSTANCE RESULTS UNAVAILABLE DUE TO INTERFERING SUBSTANCE RESULTS UNAVAILABLE DUE TO INTERFERING SUBSTANCE RESULTS UNAVAILABLE DUE TO INTERFERING SUBSTANCE RESULTS UNAVAILABLE DUE TO INTERFERING SUBSTANCE  MCV 94.2  --  RESULTS UNAVAILABLE DUE TO INTERFERING SUBSTANCE RESULTS UNAVAILABLE DUE TO INTERFERING SUBSTANCE RESULTS UNAVAILABLE DUE TO INTERFERING SUBSTANCE RESULTS UNAVAILABLE DUE TO INTERFERING SUBSTANCE  --   PLT 282  --  281 289 297 290  --    < > = values in this interval not displayed.    @IMGRELPRIORS @ Medications:    .  stroke: mapping our early stages of recovery book   Does not apply Once  . acyclovir  400 mg Oral QHS  . amLODipine  5 mg Oral Daily  . calcium acetate  2,001 mg Oral TID WC  . calcium carbonate  2 tablet Oral QHS  . Chlorhexidine Gluconate Cloth  6 each Topical Q0600  . cinacalcet  60 mg Oral QPM  . ciprofloxacin  500 mg Oral QPM  . clonazePAM  0.5 mg Oral Q M,W,F-HD  .  feeding supplement (PRO-STAT SUGAR FREE 64)  30 mL Oral TID BM  . fluticasone furoate-vilanterol  1 puff Inhalation Daily  . insulin aspart  0-5 Units Subcutaneous QHS  . insulin aspart  0-9 Units Subcutaneous TID WC  . lacosamide  150 mg Oral BID  . lacosamide  150 mg Oral Q M,W,F-HD  . lamoTRIgine  150 mg Oral BID  . metoprolol tartrate  12.5 mg Oral BID  . multivitamin  1 tablet Oral QHS  . pantoprazole  40 mg Oral Daily     Madelon Lips, MD 06/14/2019, 11:57 AM

## 2019-06-14 NOTE — Discharge Summary (Signed)
Physician Discharge Summary  [facility] YHC:[id number] DOB: 19-Jul-1966 DOA: 06/09/2019  PCP: [name], MD  Admit date: 06/09/2019 Discharge date: 06/14/2019  Time spent: 60 minutes  Recommendations for Outpatient Follow-up:  1. Patient to be discharged to inpatient rehab. 2. Follow-up with [name], MD 2 weeks post discharge from inpatient rehab.   Discharge Diagnoses:  Principal Problem:   Acute on chronic intracranial subdural hematoma (HCC) Active Problems:   Multiple myeloma not having achieved remission (HCC)   ESRD on hemodialysis ([place])   Essential hypertension   DM type 2 (diabetes mellitus, type 2) (HCC)   Seizure disorder (HCC)   Aspiration pneumonia (HCC)   History of bacteremia   Atrial fibrillation, chronic (HCC)   SDH (subdural hematoma) ([place])   Discharge Condition: Stable and improved  Diet recommendation: Heart healthy  Filed Weights   06/13/19 1100 06/13/19 1436 06/14/19 0524  Weight: 85.4 kg 83.3 kg 84 kg    History of present illness:  Per Dr. Vergia Alcon is a 53 y.o. male with medical history significant of CVA; seizure d/o; OSA on CPAP; PE on Coumadin; multiple myeloma; HTN; ESRD on MWF HD; DM; and afib presented with R facial droop, R sided weakness, and slurred speech this AM.  He was unable to receive tPA due to recent SDH.  After he got home from the hospital, he was trying to get up the stairs and around the house.  He walked with a walker some but mostly sat in a chair.  He was able to swallow.  Everything was good while sleeping.  About 7AM the day prior to admission, he took his medicine and kind of threw it up.  He noticed weakness, difficulty getting up onto to walker.  He was took weak to go to HD.  He was able to tolerate breakfast, lunch, dinner. He was able to walk but was fatigued.  This AM, he got up and took his medicine.  An hour later, he called his wife and was vomiting.  He was unable to lift his legs.  His right LE  was weaker than she had ever seen it.  His speech was worse than normal - generally understandable speech but more slurred than normal. His R facial droop was more than usual.  He was admitted at [name] from 9/17-29 for planned autologous stem cell transplant for multiple myeloma.  However, he had seizure activity following HD on 9/18, and head CT revealed an acute on chronic R-sided SDH.  He was also found to have Proteus mirabilis bacteremia (pan-sensitive), which was treated with Zosyn.  He was found to have B renal stones, but urology deferred removal in the setting of bacteremia.  He was transitioned to Cipro for continuous prophylactic therapy.  He was also noted to have aspiration PNA.  For his seizures, he was changed to Vimpat and Depabote with Klonopin after HD.  Given his multitude of medical problems, stem cell transplantation was held.   ED Course: Discharged from [facility] Tuesday - multiple myeloma, Proteus bacteremia, seizures, SDH. He got better and was discharged Tuesday. Doing ok until yesterday - worse on the right, trouble with words. Dr. Leonel Ramsay saw him. ?change in CT. Dr. Ellene Route looked at the CT - probably needs overnight evaluation, would consider evacuation.  Hospital Course:  1 acute/subacute/chronic SDH Questionable etiology.  Patient with prior to admission and found to have an acute on chronic right-sided SDH.  Patient had presented with worsening neurological symptoms on day of  admission with concern for progressive disease/new CVA.  During prior hospitalization at [facility] patient was noted to have a 2 mm midline shift.  Head CT done on admission with increased midline shift to 4 mm.  MRI head done with right-sided subdural hematoma appearing predominantly subacute although there may be an area of more recent hemorrhage posteriorly based on high density on CT.  Fluid measures up to 14 mm in thickness in the parietal lobe and 9 mm in thickness in the frontal lobe, 5 mm  midline shift to the left.  Negative for any acute infarct.  Generalized atrophy with chronic ischemia.  Patient improving slowly during the hospitalization.  Dysarthria improved.  Right facial weakness improved.  Repeat head CT done 06/12/2019 with stable mixed density right-sided subdural hematoma since 06/09/2019.  Stable intracranial mass-effect with leftward midline shift of 3 to 4 mm.  No new intracranial abnormality.  Chronic ischemia in the left MCA, right PCA and left cerebellar artery territories.   Neurosurgery was consulted and followed the patient throughout the hospitalization.  Per neurosurgery no surgical intervention needed at this time.  Patient was seen by PT/OT/SLP.  CIR has been consulted and patient deemed an appropriate candidate to go to inpatient rehab.  Patient was discharged to inpatient rehab.  Will need outpatient follow-up with neurosurgery.   2.  End-stage renal disease on hemodialysis Patient was followed by nephrology during the hospitalization he received hemodialysis on his hemodialysis days.  Patient maintained on PhosLo, Sensipar, Rena-Vite, Klonopin.    3.  Recent aspiration pneumonia Noted on CT scan 916.  Patient was on IV Zosyn and transitioned to ciprofloxacin on 06/02/2019.  Patient maintained on home regimen of oral ciprofloxacin.  Outpatient follow up.   4.  Seizure d/o No seizures noted.    Patient maintained on home regimen of Vimpat and Lamictal.  5.  Paroxysmal atrial fibrillation Patient with history of A. fib and PFO.  Status post IVC filter.  Patient not on anticoagulation likely not a candidate secondary to problem #1.  Patient was on aspirin which has been held during the hospitalization secondary to problem #1.  Patient maintained on metoprolol for rate control.  Outpatient follow-up.    6.    Well-controlled type II diabetes mellitus Hemoglobin A1c 4.8.  Patient was maintained on sliding scale insulin during the hospitalization.  Outpatient  follow-up.  7.  Hypertension Patient maintained on home regimen of Norvasc and metoprolol.  Outpatient follow-up.   8.  History of Proteus bacteremia During recent hospitalization patient noted to have a Proteus bacteremia with 1/2 cultures which were pansensitive.  Patient was continued on home regimen of ciprofloxacin.  Repeat blood cultures from 05/30/2019 negative-.  Repeat blood cultures ordered during this hospitalization with no growth to date. Urology deferred stone removal due to bacteremia will need outpatient follow-up with urology.  9.  Multiple myeloma Patient previously followed for MGUS but bone marrow biopsy in 2018 showed almost 20% plasma cell infiltration.  Skeletal survey negative in 10/2017.  Treatment deferred in 12/2017 due to lack of symptoms.  8/19 bone marrow biopsy with 25% abnormal plasma cells, FISH with IgG kappa.  Skeletal survey remain negative.  Patient recently admitted to [facility] for stem cell transplant but delayed due to other medical problems that occurred while hospitalized.  Outpatient follow-up.   Procedures:  MRI brain 06/09/2019  CT head 06/09/2019  Chest x-ray 06/09/2019  Consultations:  Neurosurgery: Dr. Ellene Route 06/09/2019  Neurology: Dr. Leonel Ramsay 06/09/2019  Discharge Exam: Vitals:  06/14/19 0623 06/14/19 1109  BP: 120/70 126/69  Pulse: 66 72  Resp: 18 11  Temp:  98.7 F (37.1 C)  SpO2:      General: NAD Cardiovascular: RRR Respiratory: CTAB  Discharge Instructions     Allergies  Allergen Reactions  . Codeine Swelling   Follow-up Information    [name], MD. Schedule an appointment as soon as possible for a visit in 2 week(s).   Specialty: Internal Medicine Contact information: [address] [place] [address] [phone]            The results of significant diagnostics from this hospitalization (including imaging, microbiology, ancillary and laboratory) are listed below for reference.     Significant Diagnostic Studies: Ct Head Wo Contrast  Result Date: 06/12/2019 CLINICAL DATA:  53 year old male with multiple myeloma. Right side subdural hematoma. EXAM: CT HEAD WITHOUT CONTRAST TECHNIQUE: Contiguous axial images were obtained from the base of the skull through the vertex without intravenous contrast. COMPARISON:  Brain MRI and head CT 06/09/2019. FINDINGS: Brain: Mixed density right side subdural hematoma measures up to 14-15 millimeters in thickness and appears unchanged since 06/09/2019, including several curvilinear areas of hyperdense blood within the background of hypodense subdural. Smaller more hyperdense right tentorial and right occipital pole component is also stable. Leftward midline shift of 3-4 millimeters is stable. Basilar cisterns remain patent. No ventriculomegaly no new intracranial hemorrhage identified. Chronic encephalomalacia in the left MCA, right PCA, and left cerebellar artery territories. Stable gray-white matter differentiation throughout the brain. No cortically based acute infarct identified. Vascular: Calcified atherosclerosis at the skull base. No suspicious intracranial vascular hyperdensity. Skull: No acute osseous abnormality identified. Sinuses/Orbits: Left maxillary sinus mucous retention cyst. Other Visualized paranasal sinuses and mastoids are stable and well pneumatized. Other: No acute orbit or scalp soft tissue findings. Left occipital region scalp sebaceous cyst. IMPRESSION: 1. Stable mixed density right side subdural hematoma since 06/09/2019. Stable intracranial mass effect with leftward midline shift of 3-4 mm. 2. No new intracranial abnormality. 3. Chronic ischemia in the left MCA, right PCA, and left cerebellar artery territories. Electronically Signed   By: Genevie Ann M.D.   On: 06/12/2019 09:54   Mr Brain Wo Contrast  Result Date: 06/09/2019 CLINICAL DATA:  Multiple myeloma, sepsis, diabetes. Subdural hemorrhage on CT today EXAM: MRI HEAD  WITHOUT CONTRAST TECHNIQUE: Multiplanar, multiecho pulse sequences of the brain and surrounding structures were obtained without intravenous contrast. COMPARISON:  CT head 06/09/2019 FINDINGS: Brain: Subdural hematoma on the right has complex signal characteristics compatible with subacute to chronic blood. Based on CT, there may be a small amount of more recent hemorrhage posteriorly. There are multiple septations within the fluid. The subdural fluid measures approximately 14 mm in thickness in the parietal lobe and approximately 9 mm in thickness in the frontal lobe. There is mass-effect on the right hemisphere with 5 mm midline shift to the left. Ventricle size normal.  Negative for acute infarct Chronic hemorrhagic infarct left posterior frontal lobe. Chronic hemorrhagic infarct right posterior temporal/occipital lobe. Moderate atrophy. Vascular: Normal arterial flow voids Skull and upper cervical spine: No focal bony lesion. Sinuses/Orbits: Mucosal edema paranasal sinuses. Cyst left maxillary sinus. Negative orbit. Other: None IMPRESSION: Right-sided subdural hematoma appears predominantly subacute although there may be an area of more recent hemorrhage posteriorly based on high density on CT. Fluid measures up to 14 mm in thickness in the parietal lobe and 9 mm in thickness in the frontal lobe. 5 mm midline shift  to the left. Negative for acute infarct Generalized atrophy with chronic ischemia. Electronically Signed   By: Franchot Gallo M.D.   On: 06/09/2019 16:58   Dg Chest Port 1 View  Result Date: 06/09/2019 CLINICAL DATA:  Altered mental status EXAM: PORTABLE CHEST 1 VIEW COMPARISON:  01/06/2016 FINDINGS: The heart size and mediastinal contours are within normal limits. Both lungs are clear. The visualized skeletal structures are unremarkable. IMPRESSION: No acute cardiopulmonary findings. Electronically Signed   By: Davina Poke M.D.   On: 06/09/2019 10:50   Ct Head Code Stroke Wo  Contrast  Result Date: 06/09/2019 CLINICAL DATA:  Code stroke. Focal neuro deficit, less than 6 hours, stroke suspected. Additional history provided: Last known well 0 800, right-sided weakness, slurred speech, right facial droop. EXAM: CT HEAD WITHOUT CONTRAST TECHNIQUE: Contiguous axial images were obtained from the base of the skull through the vertex without intravenous contrast. COMPARISON:  Brain MRI 01/07/2016 FINDINGS: Brain: There is a mixed density holohemispheric subdural collection on the right containing internal foci of hyperdense acute/subacute blood products. This measures up to 1.5 cm in greatest thickness. There is mass effect upon the underlying right cerebral hemisphere. 4 mm leftward midline shift. No new cortical infarct is identified. Multiple redemonstrated chronic infarcts as follows. Moderate-sized chronic infarct within the left frontal lobe. Small chronic infarct within the right frontal lobe. Small chronic infarct within the right temporal occipital lobe. Chronic bilateral cerebellar lacunar infarcts. Vascular: No hyperdense vessel. Atherosclerotic calcification of the carotid artery siphons and vertebrobasilar system. Skull: No calvarial fracture. Sinuses/Orbits: Visualized orbits demonstrate no acute abnormality. Partially imaged mucous retention cyst within the inferior left maxillary sinus. Mild ethmoid sinus mucosal thickening. No significant mastoid effusion. Left suboccipital scalp sebaceous cyst. These results were called by telephone at the time of interpretation on 06/09/2019 at 10:30 am to provider Dr. Leonel Ramsay, who verbally acknowledged these results. ASPECTS [facility] Stroke Program Early CT Score) - Ganglionic level infarction (caudate, lentiform nuclei, internal capsule, insula, M1-M3 cortex): 7 - Supraganglionic infarction (M4-M6 cortex): 3 Total score (0-10 with 10 being normal): 10 (when discounting chronic infarcts) IMPRESSION: 1. Mixed density right holohemispheric  subdural collection containing internal foci of acute/subacute blood products, measuring up to 1.5 cm in thickness. Mass effect upon the underlying right cerebral hemisphere. 4 mm leftward midline shift. 2. No new cortical infarct is identified. Redemonstrated chronic infarcts within the left frontal lobe, right frontal lobe, right temporal occipital lobes and bilateral cerebellar hemispheres as described. Electronically Signed   By: Kellie Simmering   On: 06/09/2019 10:42    Microbiology: Recent Results (from the past 240 hour(s))  SARS Coronavirus 2 [facility] order, Performed in [facility] hospital lab) Nasopharyngeal Nasopharyngeal Swab     Status: None   Collection Time: 06/09/19 10:47 AM   Specimen: Nasopharyngeal Swab  Result Value Ref Range Status   SARS Coronavirus 2 NEGATIVE NEGATIVE Final    Comment: (NOTE) If result is NEGATIVE SARS-CoV-2 target nucleic acids are NOT DETECTED. The SARS-CoV-2 RNA is generally detectable in upper and lower  respiratory specimens during the acute phase of infection. The lowest  concentration of SARS-CoV-2 viral copies this assay can detect is 250  copies / mL. A negative result does not preclude SARS-CoV-2 infection  and should not be used as the sole basis for treatment or other  patient management decisions.  A negative result may occur with  improper specimen collection / handling, submission of specimen other  than nasopharyngeal swab, presence of viral mutation(s)  within the  areas targeted by this assay, and inadequate number of viral copies  (<250 copies / mL). A negative result must be combined with clinical  observations, patient history, and epidemiological information. If result is POSITIVE SARS-CoV-2 target nucleic acids are DETECTED. The SARS-CoV-2 RNA is generally detectable in upper and lower  respiratory specimens dur ing the acute phase of infection.  Positive  results are indicative of active infection with SARS-CoV-2.  Clinical   correlation with patient history and other diagnostic information is  necessary to determine patient infection status.  Positive results do  not rule out bacterial infection or co-infection with other viruses. If result is PRESUMPTIVE POSTIVE SARS-CoV-2 nucleic acids MAY BE PRESENT.   A presumptive positive result was obtained on the submitted specimen  and confirmed on repeat testing.  While 2019 novel coronavirus  (SARS-CoV-2) nucleic acids may be present in the submitted sample  additional confirmatory testing may be necessary for epidemiological  and / or clinical management purposes  to differentiate between  SARS-CoV-2 and other Sarbecovirus currently known to infect humans.  If clinically indicated additional testing with an alternate test  methodology [phone]) is advised. The SARS-CoV-2 RNA is generally  detectable in upper and lower respiratory sp ecimens during the acute  phase of infection. The expected result is Negative. Fact Sheet for Patients:  StrictlyIdeas.no Fact Sheet for Healthcare Providers: BankingDealers.co.za This test is not yet approved or cleared by the [place] FDA and has been authorized for detection and/or diagnosis of SARS-CoV-2 by FDA under an Emergency Use Authorization (EUA).  This EUA will remain in effect (meaning this test can be used) for the duration of the COVID-19 declaration under Section 564(b)(1) of the Act, 21 U.S.C. section 360bbb-3(b)(1), unless the authorization is terminated or revoked sooner. Performed at [facility], [place] [address], [place], [place] [address]   Blood culture (routine x 2)     Status: None (Preliminary result)   Collection Time: 06/09/19 10:47 AM   Specimen: BLOOD  Result Value Ref Range Status   Specimen Description BLOOD UNKNOWN  Final   Special Requests   Final    BOTTLES DRAWN AEROBIC AND ANAEROBIC Blood Culture results may not be optimal due to an  inadequate volume of blood received in culture bottles   Culture   Final    NO GROWTH 4 DAYS Performed at [facility], [place] [address] [place],  [place] [address]    Report Status PENDING  Incomplete     Labs: Basic Metabolic Panel: Recent Labs  Lab 06/10/19 0810 06/11/19 0928 06/12/19 0441 06/13/19 0346 06/14/19 0247  NA 138 138 138 139 136  K 4.3 4.0 4.3 4.9 4.2  CL 102 99 100 100 98  CO2 _0 GLUCOSE 110* 84 83 139* 107*  BUN 32* 20 33* 48* 23*  CREATININE 12.44* 8.56* 10.13* 12.80* 7.50*  CALCIUM 8.6* 8.3* 8.3* 8.3* 8.1*  PHOS 2.3* 2.7 2.9 3.0 2.6   Liver Function Tests: Recent Labs  Lab 06/09/19 1047 06/10/19 0810 06/11/19 0928 06/12/19 0441 06/13/19 0346 06/14/19 0247  AST 18  --   --   --   --   --   ALT 9  --   --   --   --   --   ALKPHOS 78  --   --   --   --   --   BILITOT 0.7  --   --   --   --   --  PROT 6.3*  --   --   --   --   --   ALBUMIN 3.4* 3.2* 3.0* 3.0* 3.1* 3.0*   Recent Labs  Lab 06/09/19 1047  LIPASE 30   No results for input(s): AMMONIA in the last 168 hours. CBC: Recent Labs  Lab 06/09/19 1047  06/10/19 0810 06/11/19 0928 06/12/19 0441 06/13/19 0346 06/14/19 0247  WBC 8.9  --  4.5 5.6 6.1 5.8  --   NEUTROABS 6.4  --   --  3.0  --   --   --   HGB 9.5*   < > 8.8* 9.0* 9.4* 9.0* 8.9*  HCT 26.0*   < > RESULTS UNAVAILABLE DUE TO INTERFERING SUBSTANCE RESULTS UNAVAILABLE DUE TO INTERFERING SUBSTANCE RESULTS UNAVAILABLE DUE TO INTERFERING SUBSTANCE RESULTS UNAVAILABLE DUE TO INTERFERING SUBSTANCE RESULTS UNAVAILABLE DUE TO INTERFERING SUBSTANCE  MCV 94.2  --  RESULTS UNAVAILABLE DUE TO INTERFERING SUBSTANCE RESULTS UNAVAILABLE DUE TO INTERFERING SUBSTANCE RESULTS UNAVAILABLE DUE TO INTERFERING SUBSTANCE RESULTS UNAVAILABLE DUE TO INTERFERING SUBSTANCE  --   PLT 282  --  281 289 297 290  --    < > = values in this interval not displayed.   Cardiac Enzymes: No results for input(s): CKTOTAL, CKMB, CKMBINDEX,  TROPONINI in the last 168 hours. BNP: BNP (last 3 results) No results for input(s): BNP in the last 8760 hours.  ProBNP (last 3 results) No results for input(s): PROBNP in the last 8760 hours.  CBG: Recent Labs  Lab 06/12/19 2051 06/13/19 0603 06/13/19 1634 06/13/19 2123 06/14/19 1107  GLUCAP 117* 112* 85 157* 111*       Signed:  Irine Seal MD.  [facility] 06/14/2019, 11:20 AM

## 2019-06-14 NOTE — Progress Notes (Signed)
Physical Therapy Treatment Patient Details Name: Isaiah [name] MRN: [id number] DOB: [birth year] Today's Date: 06/14/2019    History of Present Illness Pt is a 53 y.o. male admitted 06/09/19 with R-side weakness and slurred speach. Pt with recent SDH; was admitted at [facility] 9/17-9/29/20 for planned autologous stem cell transplant for multiple myeloma, but had seizure activity after HD on 9/18 (head CT then revealed acute on chronic SDH). CT on 10/1 showed 4m midline shift to the left. Other PMH includes CVA, OSA, PE, HTN, ESRD (HD MWF), DM.   PT Comments    Pt progressing with mobility, remains motivated to participate and regain PLOF in order to return home. Continues to be limited by generalized weakness, decreased activity tolerance and poor problem solving. Requiring modA to maintain balance while ambulating with RW due to bilateral knee instability; pt with decreased insight into deficits. Continue to recommend intensive CIR-level therapies. Pt planning to d/c to CIR this afternoon.    Follow Up Recommendations  CIR;Supervision/Assistance - 24 hour     Equipment Recommendations  None recommended by PT    Recommendations for Other Services       Precautions / Restrictions Precautions Precautions: Fall Restrictions Weight Bearing Restrictions: No    Mobility  Bed Mobility Overal bed mobility: Needs Assistance Bed Mobility: Supine to Sit     Supine to sit: Min guard;HOB elevated     General bed mobility comments: Increased time with HOB elevated and use of bed rail  Transfers Overall transfer level: Needs assistance Equipment used: Rolling walker (2 wheeled) Transfers: Sit to/from Stand Sit to Stand: Mod assist         General transfer comment: Pt unable to stand despite a few attempts with various hand positions and momentum; able to stand on 3rd attempt with modA for trunk elevation  Ambulation/Gait Ambulation/Gait assistance: Min assist;Mod assist Gait Distance  (Feet): 80 Feet Assistive device: Rolling walker (2 wheeled) Gait Pattern/deviations: Step-through pattern;Decreased stride length;Decreased weight shift to right;Leaning posteriorly Gait velocity: Decreased Gait velocity interpretation: <1.31 ft/sec, indicative of household ambulator General Gait Details: Slow gait with RW, initial minA for balance and safety with RW; pt easily fatigued with BLE buckling noted requiring intermittent modA for balance, also increased time processing with RW eventually requiring physical assist to get RW wheel unstuck. Poor awareness   Stairs             Wheelchair Mobility    Modified Rankin (Stroke Patients Only) Modified Rankin (Stroke Patients Only) Pre-Morbid Rankin Score: Slight disability Modified Rankin: Moderately severe disability     Balance Overall balance assessment: Needs assistance Sitting-balance support: No upper extremity supported;Feet supported Sitting balance-Leahy Scale: Fair       Standing balance-Leahy Scale: Poor Standing balance comment: BLE buckling with static standing at sink for ADL tasks requiring external support                            Cognition Arousal/Alertness: Awake/alert Behavior During Therapy: Flat affect Overall Cognitive Status: Within Functional Limits for tasks assessed                                        Exercises      General Comments        Pertinent Vitals/Pain Pain Assessment: No/denies pain    Home Living   Living Arrangements: Spouse/significant other;Children  Prior Function        Comments: did not use RW at all times per wife, Independent prior to recent [facility] admit   PT Goals (current goals can now be found in the care plan section) Progress towards PT goals: Progressing toward goals    Frequency    Min 4X/week      PT Plan Frequency needs to be updated    Co-evaluation PT/OT/SLP  Co-Evaluation/Treatment: Yes Reason for Co-Treatment: Necessary to address cognition/behavior during functional activity;For patient/therapist safety;To address functional/ADL transfers PT goals addressed during session: Mobility/safety with mobility        AM-PAC PT "6 Clicks" Mobility   Outcome Measure  Help needed turning from your back to your side while in a flat bed without using bedrails?: A Little Help needed moving from lying on your back to sitting on the side of a flat bed without using bedrails?: A Little Help needed moving to and from a bed to a chair (including a wheelchair)?: A Little Help needed standing up from a chair using your arms (e.g., wheelchair or bedside chair)?: A Lot Help needed to walk in hospital room?: A Lot Help needed climbing 3-5 steps with a railing? : A Lot 6 Click Score: 15    End of Session Equipment Utilized During Treatment: Gait belt Activity Tolerance: Patient tolerated treatment well Patient left: in chair;with call bell/phone within reach;with chair alarm set Nurse Communication: Mobility status PT Visit Diagnosis: Unsteadiness on feet (R26.81);Muscle weakness (generalized) (M62.81);Other abnormalities of gait and mobility (R26.89)     Time: 3729-0211 PT Time Calculation (min) (ACUTE ONLY): 24 min  Charges:  $Gait Training: 8-22 mins                     Mabeline Caras, PT, DPT Acute Rehabilitation Services  Pager [phone] Office [place] 06/14/2019, 1:09 PM

## 2019-06-14 NOTE — PMR Pre-admission (Signed)
PMR Admission Coordinator Pre-Admission Assessment  Patient: Isaiah [name] is an 53 y.o., male MRN: [id number] DOB: 1966/06/07 Height: 5' 10"  (177.8 cm) Weight: 84 kg  Insurance Information HMO:     PPO:      PCP:      IPA:      80/20:      OTHER:  PRIMARY: Medicare a and b      Policy#: [id number]      Subscriber: pt Benefits:  Phone #: passport one online     Name: 06/13/2019 Eff. Date: 12/08/2015     Deduct: $1408      Out of Pocket Max: none      Life Max: none CIR: 100%      SNF: 20 full days Outpatient: 80%     Co-Pay: 20% Home Health: 100%      Co-Pay: none DME: 80%     Co-Pay: 20% Providers: pt choice  SECONDARY: Janit Bern      Policy#: [id number]      Subscriber: wife/Sylvia  Medicaid Application Date:       Case Manager:  Disability Application Date:       Case Worker:   The "Data Collection Information Summary" for patients in Inpatient Rehabilitation Facilities with attached "Privacy Act [place] Records" was provided and verbally reviewed with: Patient and Family  Emergency Contact Information Contact Information    Name Relation Home Work Mobile   [place] Spouse   [place] Mother   [phone]      Current Medical History  Patient Admitting Diagnosis: SDH  History of Present Illness: 53 year old male with medical history significant for CVA, seizure disorder, OSA on CPAP, PE on coumadin, multiple myeloma, ESRD on Hemodialysis MWF, DM and a fib. Presented on 06/09/2019 with right facial droop, right sided weakness and slurred speech. Unable to receive TPA due to recent SDH. Recently admitted to [name] from 06/05/2019 until 9/29 for planned autologous stem cell transplant for multiple myeloma. However he had seizure activity following HD on 9/18 and head CT revealed an acute on chronic right sided SDH. Also found to have Proteus mirabilis bacteremia which was treated with Zosyn. Found to have renal stones, but urology deferred in the  setting of bacteremia. Transitioned to Cipro for continuous prophylactic therapy. Noted also to have aspiration PNA. Patient changed to Vimpat and Depakote with Klonopin after HD for his seizures. Stem cell transplantation on hold due to his multiple medical issues.  Patient presented to [facility] with worsening symptoms with concern for progressive disease/new CVA. Head CT scan done on admit with increased midline shift to 4 mm from previous known 2 mm midline shift. MRI head done with right-sided subdural hematoma appearing predominantly subacute although there may be an area of more recent hemorrhage posteriorly based on high density on CT.  Fluid measures up to 14 mm in thickness in the parietal lobe and 9 mm in thickness in the frontal lobe, 5 mm midline shift to the left.  Negative for any acute infarct.  Generalized atrophy with chronic ischemia.  Patient feels right-sided weakness slowly improving.  Dysarthria improved.  Right facial weakness improved.  Repeat head CT done 06/12/2019 with stable mixed density right-sided subdural hematoma since 06/09/2019.  Stable intracranial mass-effect with leftward midline shift of 3 to 4 mm.  No new intracranial abnormality.  Chronic ischemia in the left MCA, right PCA and left cerebellar artery territories.   Neurosurgery and Neurology following with conservative treatment.  No seizures noted. Right sided weakness improving daily but not yet at baseline. Dysarthric speech improved.  Nephrology consulted with continued hemodialysis MWF. DVT prophylaxis with SCDs.   Complete NIHSS TOTAL: 2  Patient's medical record from [facility]  has been reviewed by the rehabilitation admission coordinator and physician.  Past Medical History  Past Medical History:  Diagnosis Date  . DM type 2 (diabetes mellitus, type 2) ([place]) 06/09/2019  . Essential hypertension 06/09/2019  . Multiple myeloma ([place])   . Seizure disorder ([place]) 06/09/2019  . Sepsis ([place])   .  Subdural hematoma ([facility])     Family History   family history includes Hypertension in his father and mother.  Prior Rehab/Hospitalizations Has the patient had prior rehab or hospitalizations prior to admission? Yes  Has the patient had major surgery during 100 days prior to admission? No   Previously at [facility] 2017 after CVA  Current Medications  Current Facility-Administered Medications:  .   stroke: mapping our early stages of recovery book, , Does not apply, Once, Karmen Bongo, MD, Stopped at 06/09/19 1718 .  acetaminophen (TYLENOL) tablet 650 mg, 650 mg, Oral, Q4H PRN **OR** acetaminophen (TYLENOL) solution 650 mg, 650 mg, Per Tube, Q4H PRN **OR** acetaminophen (TYLENOL) suppository 650 mg, 650 mg, Rectal, Q4H PRN, Karmen Bongo, MD .  acyclovir (ZOVIRAX) tablet 400 mg, 400 mg, Oral, QHS, Karmen Bongo, MD, 400 mg at 06/13/19 2125 .  amLODipine (NORVASC) tablet 5 mg, 5 mg, Oral, Daily, Karmen Bongo, MD, 5 mg at 06/14/19 6160 .  calcium acetate (PHOSLO) capsule 2,001 mg, 2,001 mg, Oral, TID WC, Karmen Bongo, MD, 2,001 mg at 06/14/19 7371 .  calcium carbonate (dosed in mg elemental calcium) suspension 500 mg of elemental calcium, 500 mg of elemental calcium, Oral, Q6H PRN, Karmen Bongo, MD .  calcium carbonate (TUMS - dosed in mg elemental calcium) chewable tablet 400 mg of elemental calcium, 2 tablet, Oral, QHS, Karmen Bongo, MD, 400 mg of elemental calcium at 06/13/19 2125 .  camphor-menthol (SARNA) lotion 1 application, 1 application, Topical, G6Y PRN **AND** hydrOXYzine (ATARAX/VISTARIL) tablet 25 mg, 25 mg, Oral, Q8H PRN, Karmen Bongo, MD .  Chlorhexidine Gluconate Cloth 2 % PADS 6 each, 6 each, Topical, Q0600, Roney Jaffe, MD, 6 each at 06/14/19 [phone] .  cinacalcet (SENSIPAR) tablet 60 mg, 60 mg, Oral, QPM, Karmen Bongo, MD, 60 mg at 06/13/19 1822 .  ciprofloxacin (CIPRO) tablet 500 mg, 500 mg, Oral, QPM, Karmen Bongo, MD, 500 mg at 06/13/19 1821 .   clonazePAM (KLONOPIN) tablet 0.5 mg, 0.5 mg, Oral, Q M,W,F-HD, Roney Jaffe, MD, 0.5 mg at 06/13/19 1525 .  docusate sodium (ENEMEEZ) enema 283 mg, 1 enema, Rectal, PRN, Karmen Bongo, MD .  feeding supplement (NEPRO CARB STEADY) liquid 237 mL, 237 mL, Oral, TID PRN, Karmen Bongo, MD .  feeding supplement (PRO-STAT SUGAR FREE 64) liquid 30 mL, 30 mL, Oral, TID BM, Eugenie Filler, MD, 30 mL at 06/13/19 2008 .  fluticasone furoate-vilanterol (BREO ELLIPTA) 200-25 MCG/INH 1 puff, 1 puff, Inhalation, Daily, Karmen Bongo, MD, 1 puff at 06/13/19 [phone] .  insulin aspart (novoLOG) injection 0-5 Units, 0-5 Units, Subcutaneous, QHS, Karmen Bongo, MD .  insulin aspart (novoLOG) injection 0-9 Units, 0-9 Units, Subcutaneous, TID WC, Karmen Bongo, MD, 2 Units at 06/11/19 1212 .  lacosamide (VIMPAT) tablet 150 mg, 150 mg, Oral, BID, Roney Jaffe, MD, 150 mg at 06/14/19 0813 .  lacosamide (VIMPAT) tablet 150 mg, 150 mg, Oral, Q M,W,F-HD, Roney Jaffe, MD,  150 mg at 06/13/19 1522 .  lamoTRIgine (LAMICTAL) tablet 150 mg, 150 mg, Oral, BID, Eugenie Filler, MD, 150 mg at 06/14/19 0954 .  metoprolol tartrate (LOPRESSOR) tablet 12.5 mg, 12.5 mg, Oral, BID, Karmen Bongo, MD, 12.5 mg at 06/14/19 5701 .  multivitamin (RENA-VIT) tablet 1 tablet, 1 tablet, Oral, Ivery Quale, MD, 1 tablet at 06/13/19 2125 .  pantoprazole (PROTONIX) EC tablet 40 mg, 40 mg, Oral, Daily, Karmen Bongo, MD, 40 mg at 06/14/19 7793 .  prochlorperazine (COMPAZINE) tablet 10 mg, 10 mg, Oral, Q6H PRN, Karmen Bongo, MD .  senna-docusate (Senokot-S) tablet 1 tablet, 1 tablet, Oral, QHS PRN, Karmen Bongo, MD .  sorbitol 70 % solution 30 mL, 30 mL, Oral, PRN, Karmen Bongo, MD .  zolpidem Lorrin Mais) tablet 5 mg, 5 mg, Oral, QHS PRN, Karmen Bongo, MD, 5 mg at 06/09/19 2308  Patients Current Diet:  Diet Order            Diet heart healthy/carb modified Fluid consistency: Thin  Diet effective ____               Precautions / Restrictions Precautions Precautions: Fall Restrictions Weight Bearing Restrictions: No   Has the patient had 2 or more falls or a fall with injury in the past year? No  Prior Activity Level Community (5-7x/wk): Indpendent without AD, does not drive  Prior Functional Level Self Care: Did the patient need help bathing, dressing, using the toilet or eating? Independent  Indoor Mobility: Did the patient need assistance with walking from room to room (with or without device)? Independent  Stairs: Did the patient need assistance with internal or external stairs (with or without device)? Independent  Functional Cognition: Did the patient need help planning regular tasks such as shopping or remembering to take medications? Needed some help  Home Assistive Devices / [place] Devices/Equipment: Bedside commode/3-in-1, Gilford Rile (specify type), Hand-held shower hose, Grab bars in shower, Raised toilet seat with rails, Blood pressure cuff, CBG Meter, Scales Home Equipment: Grab bars - tub/shower, Hand held shower head, Walker - 4 wheels  Prior Device Use: Indicate devices/aids used by the patient prior to current illness, exacerbation or injury? Walker intermittently only  Current Functional Level Cognition  Overall Cognitive Status: Within Functional Limits for tasks assessed Orientation Level: Oriented X4    Extremity Assessment (includes Sensation/Coordination)  Upper Extremity Assessment: RUE deficits/detail RUE Deficits / Details: Slowness of movement compared to LUE and decreased coordination RUE Sensation: WNL RUE Coordination: decreased fine motor, decreased gross motor  Lower Extremity Assessment: RLE deficits/detail RLE Deficits / Details: grossly 4/5    ADLs  Overall ADL's : Needs assistance/impaired Eating/Feeding: Modified independent, Sitting Eating/Feeding Details (indicate cue type and reason): in recliner, increased time due  to slower movement of RUE Grooming: Minimal assistance, Sitting Grooming Details (indicate cue type and reason): in recliner, increased time due to slower movement of RUE Upper Body Bathing: Minimal assistance Upper Body Bathing Details (indicate cue type and reason): in recliner, increased time due to slower movement of RUE Lower Body Bathing: Moderate assistance Lower Body Bathing Details (indicate cue type and reason): min A sit<>stand but mod A for standing balance Upper Body Dressing : Minimal assistance, Sitting Upper Body Dressing Details (indicate cue type and reason): in recliner, increased time due to slower movement of RUE Lower Body Dressing: Maximal assistance Lower Body Dressing Details (indicate cue type and reason): min A sit<>stand but mod A for standing balance Toilet Transfer: Moderate assistance, Ambulation,  RW Toileting- Water quality scientist and Hygiene: Maximal assistance Toileting - Clothing Manipulation Details (indicate cue type and reason): min A sit<>stand but mod A for standing balance    Mobility  Overal bed mobility: Needs Assistance Bed Mobility: Supine to Sit Supine to sit: Min guard General bed mobility comments: Was able to come to EOB with use of rail and incr time    Transfers  Overall transfer level: Needs assistance Equipment used: Rolling walker (2 wheeled) Transfers: Sit to/from Stand Sit to Stand: Min assist, +2 safety/equipment, From elevated surface General transfer comment: Pt needed steadying assist to rise as he was leaning posteriorly iniitally.  Relies on RW for balance    Ambulation / Gait / Stairs / Wheelchair Mobility  Ambulation/Gait Ambulation/Gait assistance: Min assist, Mod assist, +2 safety/equipment Gait Distance (Feet): 45 Feet Assistive device: Rolling walker (2 wheeled) Gait Pattern/deviations: Step-through pattern, Decreased stride length, Decreased weight shift to right, Antalgic, Leaning posteriorly General Gait  Details: Pt was able to walk into hallway with the RW with +2 min assist initially.  As pt fatigued, noted that pt with right LE buckling and needed mod assist with one LOB prior to getting back to chair.  Close +2 assist warranted.  Needed assist to control descent into chair and cues for hand placement as well.  Gait velocity interpretation: <1.31 ft/sec, indicative of household ambulator    Posture / Balance Balance Overall balance assessment: Needs assistance Sitting-balance support: No upper extremity supported, Feet supported Sitting balance-Leahy Scale: Fair Postural control: Posterior lean Standing balance support: Bilateral upper extremity supported, During functional activity Standing balance-Leahy Scale: Poor Standing balance comment: relies on UE support on RW static and dynamic as well as at least +1 external support    Special needs/care consideration BiPAP/CPAP yes CPM  N/a Continuous Drip IV  N/a Dialysis hemodialysis MWF [facility]; SCAT transport 1015 am Life Vest n/a Oxygen  N/a Special Bed  N/a Trach Size  N/a Wound Vac n/a Skin  RUE fistula Bowel mgmt:  Continent LBM 10/4 Bladder mgmt: anuric Diabetic mgmt: yes DM type 2 Behavioral consideration  N/a Chemo/radiation  N/a Multiple Myeloma with [facility] visitor is wife, [name]   Previous Home Environment  Living Arrangements: Spouse/significant other, Children  Lives With: Spouse, Son(20 year old son, [place] lives with parents) Available Help at Discharge: Family, Available 24 hours/day Type of Home: House Home Layout: Two level, 1/2 bath on main level, Bed/bath upstairs Alternate Level Stairs-Number of Steps: flight Home Access: Stairs to enter Entrance Stairs-Rails: Right Entrance Stairs-Number of Steps: 2 Bathroom Shower/Tub: Gaffer, Charity fundraiser: Standard Bathroom Accessibility: Yes How Accessible: Accessible via walker Home Care Services:  No  Discharge Living Setting Plans for Discharge Living Setting: Patient's home, Lives with (comment)(spouse and 31 year old son) Type of Home at Discharge: House Discharge Home Layout: Two level, Able to live on main level with bedroom/bathroom, Full bath on main level Alternate Level Stairs-Rails: Right Alternate Level Stairs-Number of Steps: flight Discharge Home Access: Stairs to enter Entrance Stairs-Rails: Right Entrance Stairs-Number of Steps: 2 Discharge Bathroom Shower/Tub: Walk-in shower, Door Discharge Bathroom Toilet: Standard Discharge Bathroom Accessibility: Yes How Accessible: Accessible via walker Does the patient have any problems obtaining your medications?: No  Social/Family/Support Systems Patient Roles: Spouse, Parent Contact Information: wife, [name] Anticipated Caregiver: wife and son Anticipated Caregiver's Contact Information: [phone] [name] Ability/Limitations of Caregiver: wife works at [employer] days, [place] taking classes [employer] remotely Caregiver Availability: 24/7 Discharge Plan Discussed with Primary Caregiver: Yes Is Caregiver In Agreement with Plan?: Yes Does Caregiver/Family have Issues with Lodging/Transportation while Pt is in Rehab?: No  Goals/Additional Needs Patient/Family Goal for Rehab: Mod I to supervision with PT, OT, and SLP Expected length of stay: ELOS 10 to 12 days Special Service Needs: Hemodialysis MWF Pt/Family Agrees to Admission and willing to participate: Yes Program Orientation Provided & Reviewed with Pt/Caregiver Including Roles  & Responsibilities: Yes  Decrease burden of Care through IP rehab admission: n/a  Possible need for SNF placement upon discharge:  Not anticipated  Patient Condition: I have reviewed medical records from [facility] , spoken with CM, and patient and spouse. I met with patient at the bedside for inpatient rehabilitation assessment.  Patient will benefit from  ongoing PT, OT and SLP, can actively participate in 3 hours of therapy a day 5 days of the week, and can make measurable gains during the admission.  Patient will also benefit from the coordinated team approach during an Inpatient Acute Rehabilitation admission.  The patient will receive intensive therapy as well as Rehabilitation physician, nursing, social worker, and care management interventions.  Due to bladder management, bowel management, safety, skin/wound care, disease management, medication administration, pain management and patient education the patient requires 24 hour a day rehabilitation nursing.  The patient is currently min to mod assist with mobility and basic ADLs.  Discharge setting and therapy post discharge at home with home health is anticipated.  Patient has agreed to participate in the Acute Inpatient Rehabilitation Program and will admit today.  Preadmission Screen Completed By:  Cleatrice Burke, 06/14/2019 10:17 AM ______________________________________________________________________   Discussed status with Dr. Dagoberto Ligas on  06/14/2019 at 1035 and received approval for admission today.  Admission Coordinator:  Cleatrice Burke, RN, time  0017 Date  06/14/2019   Assessment/Plan: Diagnosis: 1. Does the need for close, 24 hr/day Medical supervision in concert with the patient's rehab needs make it unreasonable for this patient to be served in a less intensive setting? Yes 2. Co-Morbidities requiring supervision/potential complications: Afib, Multiple myeloma, DM, Seizures D/O, ESRD on HD, hx of CVA 2017 3. Due to bladder management, bowel management, safety, skin/wound care, disease management, medication administration and patient education, does the patient require 24 hr/day rehab nursing? Yes 4. Does the patient require coordinated care of a physician, rehab nurse, PT (1-2 hrs/day, 5 days/week), OT (1-2 hrs/day, 5 days/week) and SLP (1 hrs/day, 5 days/week) to address  physical and functional deficits in the context of the above medical diagnosis(es)? Yes Addressing deficits in the following areas: balance, endurance, locomotion, transferring, bowel/bladder control, bathing, dressing, grooming, cognition, speech and language 5. Can the patient actively participate in an intensive therapy program of at least 3 hrs of therapy 5 days a week? Yes 6. The potential for patient to make measurable gains while on inpatient rehab is good 7. Anticipated functional outcomes upon discharge from inpatients are: supervision PT, supervision OT, supervision SLP 8. Estimated rehab length of stay to reach the above functional goals is: 10-12 days 9. Anticipated D/C setting: Home 10. Anticipated post D/C treatments: [place] therapy 11. Overall Rehab/Functional Prognosis: good  MD Signature:

## 2019-06-14 NOTE — H&P (Signed)
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  He was discharged to home on 06/07/19 on cipro but has had progressive weakness with inability to tolerate po's. He developed slurred speech with increase in right sided weakness, worsening of right facial weakness with slurred speech, inability to walk and missed dialysis day PTA 06/09/19 for work up. CT head done revealing 1.5 cm acute on chronic SDH with 4 mm midline shift and chronic left/right frontal, right temporal, occipital and bilateral cerebellar infracts. MRI brain showed right chronic SDH with acute features, 14 mm thickness in parietal lobe and 9 mm thickness in frontal lobe with 5 mm midline shift, negative for acute infarct and generalized atrophy"  Dr. Ellene Route consulted and recommended conservative care.  Dr. Leonel Ramsay felt that there was low suspicion for neurological cause and "mild worsening of right sided weakness was in setting of physiological stress of N/V".  Blood cultures X 2 negative and he continues on cipro to complete antibiotic regimen for treatment of  recent bacteremia?  Follow up CT head 10/4 showed stable right SDH with stable mass effect/3-4 mm midline shift and chronic L-MCA, Left cerebellar territory and right PCA infarcts.  Mentation improving and therapy initiated showing generalized weakness with poor activity tolerance  and cognitive deficits. CIR recommended due to functional decline.   Spoke with Dr Denman George- he suggested to order CT of head on CIR IF patient's level of function declined in any way.  LBM "just a few minutes ago"- doesn't feel constipated.     Review of Systems  Constitutional: Negative for chills and fever.  HENT: Negative for hearing loss.   Eyes: Positive for blurred vision (on and off).  Respiratory: Negative for cough and shortness of breath.   Cardiovascular: Negative for chest pain and palpitations.  Gastrointestinal: Negative for constipation, heartburn and nausea.  Musculoskeletal: Negative for myalgias.  Skin: Negative for rash.  Neurological: Positive for speech change and focal weakness. Negative for dizziness.  Psychiatric/Behavioral: Positive for memory loss. The patient is not nervous/anxious.   All other systems reviewed and are negative.   Past Medical History:  Diagnosis Date   DM type 2 (diabetes mellitus, type 2) ([place]) 06/09/2019   Essential hypertension 06/09/2019   GIB (gastrointestinal bleeding)    Recurrent episodes- 09/2014, 01/201 and 06/2016   Gout    Multiple myeloma ([place])    Pulmonary emboli ([place]) 07/2011   bilateral--treated with comadin X 6 months   Seizure disorder ([place]) 06/09/2019   Sepsis ([place])    Sleep apnea    Stroke ([place]) 09/2015   R-MCA, L-MCA, PCA and bilateral cerebellar complicated by DVT/PE   Subdural hematoma ([place]) 05/2019    History reviewed. No pertinent surgical history.    Family History  Problem Relation Age of Onset   Hypertension Mother    Hypertension Father    Stroke Father    Sickle  cell anemia Brother     Social History:  Married. Independent without AD but sedentary--lays around on days after dialysis. He reports that he has never smoked. He has never used smokeless tobacco. He reports previous alcohol use. He reports that he does not use drugs.  Pt lives in 2 Story home with 2 STE- MBR on 2nd  floor- no bedroom on 1st floor- wife works at [employer].   Allergies  Allergen Reactions   Codeine Swelling   Medications Prior to Admission  Medication Sig Dispense Refill   acyclovir (ZOVIRAX) 400 MG tablet Take 400 mg by mouth at bedtime.     amLODipine (NORVASC) 5 MG tablet Take 5 mg by mouth daily.     aspirin EC 81 MG tablet Take 81 mg by mouth daily.     calcium acetate (PHOSLO) 667 MG capsule Take 2,001 mg by mouth 3 (three) times daily with meals.      calcium carbonate (TUMS - DOSED IN MG ELEMENTAL CALCIUM) 500 MG chewable tablet Chew 2 tablets by mouth at bedtime.     cinacalcet (SENSIPAR) 60 MG tablet Take 60 mg by mouth every evening.     ciprofloxacin (CIPRO) 500 MG tablet Take 500 mg by mouth every evening. For 120 days     clonazePAM (KLONOPIN) 0.5 MG tablet Take 0.5 mg by mouth every Monday, Wednesday, and Friday with hemodialysis.     fluticasone furoate-vilanterol (BREO ELLIPTA) 200-25 MCG/INH AEPB Inhale 1 puff into the lungs daily.     insulin lispro (HUMALOG) 100 UNIT/ML injection Inject 0-4 Units into the skin See admin instructions. Sliding Scale: Dexcom Meter If blood sugar is 150-200 give 2u, if 201-250=3u, 251 or greater give 4u     Lacosamide (VIMPAT) 150 MG TABS Take 150 mg by mouth See admin instructions. Take 1 capsule (146m) by mouth twice daily all day except on Mon, Wed, Fri take 1 capsule in the morning, 1 capsule after dialysis, and 1 capsule at bedtime.     lamoTRIgine (LAMICTAL) 150 MG tablet Take 150 mg by mouth 2 (two) times daily.     metoprolol tartrate (LOPRESSOR) 25 MG tablet Take 12.5 mg by mouth 2 (two) times daily.     multivitamin (RENA-VIT) TABS tablet Take 1 tablet by mouth at bedtime.     pantoprazole (PROTONIX) 40 MG tablet Take 40 mg by mouth daily.     prochlorperazine (COMPAZINE) 10 MG tablet Take 10 mg by mouth every 6 (six) hours as needed for nausea or vomiting.      Drug Regimen Review  Drug regimen was reviewed  and remains appropriate with no significant issues identified  Home: Home Living Family/patient expects to be discharged to:: Private residence Living Arrangements: Spouse/significant other, Children Available Help at Discharge: Family, Available 24 hours/day Type of Home: House Home Access: Stairs to enter ECenterPoint Energyof Steps: 2 Entrance Stairs-Rails: Right Home Layout: Two level, 1/2 bath on main level, Bed/bath upstairs Alternate Level Stairs-Number of Steps: flight Bathroom Shower/Tub: WGaffer Door BConocoPhillipsToilet: Standard Bathroom Accessibility: Yes Home Equipment: Grab bars - tub/shower, HEngineer, manufacturingheld shower head, WEnvironmental consultant- 4 wheels  Lives With: Spouse, Son(224year old son, [place]ives with parents)   Functional History: Prior Function Level of Independence: Independent with assistive device(s) Comments: did not use RW at all times per wife, Independent prior to recent [facility] admit  Functional Status:  Mobility: Bed Mobility Overal bed mobility: Needs Assistance Bed Mobility: Supine to Sit Supine to sit: Min  guard, HOB elevated General bed mobility comments: Increased time with HOB elevated and use of bed rail Transfers Overall transfer level: Needs assistance Equipment used: Rolling walker (2 wheeled) Transfers: Sit to/from Stand Sit to Stand: Mod assist General transfer comment: requires cueing for hand placement and technique, requires mod assist to ascend after several trials  Ambulation/Gait Ambulation/Gait assistance: Min assist, Mod assist Gait Distance (Feet): 80 Feet Assistive device: Rolling walker (2 wheeled) Gait Pattern/deviations: Step-through pattern, Decreased stride length, Decreased weight shift to right, Leaning posteriorly General Gait Details: Slow gait with RW, initial minA for balance and safety with RW; pt easily fatigued with BLE buckling noted requiring intermittent modA for balance, also increased time processing with RW  eventually requiring physical assist to get RW wheel unstuck. Poor awareness Gait velocity: Decreased Gait velocity interpretation: <1.31 ft/sec, indicative of household ambulator    ADL: ADL Overall ADL's : Needs assistance/impaired Eating/Feeding: Modified independent, Sitting Eating/Feeding Details (indicate cue type and reason): in recliner, increased time due to slower movement of RUE Grooming: Minimal assistance, Standing Grooming Details (indicate cue type and reason): standing at sink to complete oral care and washing face, preference to at least 1 UE support; encouarged using R dominant UE to brush teeth but pt easily frustrated and switched to L hand (then weightbearing through R UE in standing); able to squeeze washcloth with BUEs before washing face; cueing for balance and noted R sided lean and knee buckling with fatigue Upper Body Bathing: Minimal assistance Upper Body Bathing Details (indicate cue type and reason): in recliner, increased time due to slower movement of RUE Lower Body Bathing: Moderate assistance Lower Body Bathing Details (indicate cue type and reason): min A sit<>stand but mod A for standing balance Upper Body Dressing : Minimal assistance, Sitting Upper Body Dressing Details (indicate cue type and reason): in recliner, increased time due to slower movement of RUE Lower Body Dressing: Maximal assistance Lower Body Dressing Details (indicate cue type and reason): min A sit<>stand but mod A for standing balance Toilet Transfer: Moderate assistance, Ambulation, RW Toilet Transfer Details (indicate cue type and reason): simulated to recliner  Toileting- Clothing Manipulation and Hygiene: Maximal assistance Toileting - Clothing Manipulation Details (indicate cue type and reason): min A sit<>stand but mod A for standing balance Functional mobility during ADLs: Minimal assistance, Rolling walker, Cueing for safety General ADL Comments: pt limited by weakness (R),  impaired balance  Cognition: Cognition Overall Cognitive Status: Within Functional Limits for tasks assessed Orientation Level: Oriented X4 Cognition Arousal/Alertness: Awake/alert Behavior During Therapy: Flat affect Overall Cognitive Status: Within Functional Limits for tasks assessed   Blood pressure 126/69, pulse 72, temperature 98.7 F (37.1 C), temperature source Oral, resp. rate 11, height _0  (1.778 m), weight 84 kg, SpO2 98 %. Physical Exam  Nursing note and vitals reviewed. Constitutional: He appears well-developed and well-nourished. No distress.  Sitting up in chair at bedside; talking on phone- slightly dysarthric, NAD  HENT:  Head: Normocephalic and atraumatic.  Eyes: Pupils are equal, round, and reactive to light. Conjunctivae are normal. No scleral icterus.  Conjugate gaze B/L- EOMI B/L  Neck: Normal range of motion. Neck supple. No tracheal deviation present. No thyromegaly present.  Cardiovascular:  Irregularly irregular- rate controlled  Respiratory:  CTA B/L- no W/R/R- good air movement  GI: Soft. Bowel sounds are normal. He exhibits no distension. There is no abdominal tenderness.  Musculoskeletal:     Comments: LUE and LLE 5/5 RUE bicep 4+/5, WE 4/5, tricep 4/5, grip  4-/5, finger abd 3-/5 RLE 4+/5 in HF/KE/KF/DF/and PF  Neurological: He is alert.  Mild right facial weakness with slow speech--mildly dysarthric. Slow to initiate and needed occasional cues for 2 step commands.  Fingers curled due to tone slightly on R hand Hoffman's in RUE- not on LUE MAS of 1 in R wrist/hand/fingers  Skin: Skin is warm and dry. No rash noted. He is not diaphoretic. No erythema.  Has a great thrill in R forearm    Results for orders placed or performed during the hospital encounter of 06/09/19 (from the past 48 hour(s))  Glucose, capillary     Status: Abnormal   Collection Time: 06/12/19  4:18 PM  Result Value Ref Range   Glucose-Capillary 153 (H) 70 - 99 mg/dL    Comment 1 Notify RN   Glucose, capillary     Status: Abnormal   Collection Time: 06/12/19  8:51 PM  Result Value Ref Range   Glucose-Capillary 117 (H) 70 - 99 mg/dL  CBC     Status: Abnormal   Collection Time: 06/13/19  3:46 AM  Result Value Ref Range   WBC 5.8 4.0 - 10.5 K/uL   RBC RESULTS UNAVAILABLE DUE TO INTERFERING SUBSTANCE 4.22 - 5.81 MIL/uL   Hemoglobin 9.0 (L) 13.0 - 17.0 g/dL   HCT RESULTS UNAVAILABLE DUE TO INTERFERING SUBSTANCE 39.0 - 52.0 %   MCV RESULTS UNAVAILABLE DUE TO INTERFERING SUBSTANCE 80.0 - 100.0 fL   MCH RESULTS UNAVAILABLE DUE TO INTERFERING SUBSTANCE 26.0 - 34.0 pg   MCHC RESULTS UNAVAILABLE DUE TO INTERFERING SUBSTANCE 30.0 - 36.0 g/dL   RDW RESULTS UNAVAILABLE DUE TO INTERFERING SUBSTANCE 11.5 - 15.5 %   Platelets 290 150 - 400 K/uL   nRBC RESULTS UNAVAILABLE DUE TO INTERFERING SUBSTANCE 0.0 - 0.2 %    Comment: Performed at [facility], [place] [name] [place], [place] [address]  Renal function panel     Status: Abnormal   Collection Time: 06/13/19  3:46 AM  Result Value Ref Range   Sodium 139 135 - 145 mmol/L   Potassium 4.9 3.5 - 5.1 mmol/L   Chloride 100 98 - 111 mmol/L   CO2 28 22 - 32 mmol/L   Glucose, Bld 139 (H) 70 - 99 mg/dL   BUN 48 (H) 6 - 20 mg/dL   Creatinine, Ser 12.80 (H) 0.61 - 1.24 mg/dL   Calcium 8.3 (L) 8.9 - 10.3 mg/dL   Phosphorus 3.0 2.5 - 4.6 mg/dL   Albumin 3.1 (L) 3.5 - 5.0 g/dL   GFR calc non Af Amer 4 (L) >60 mL/min   GFR calc Af Amer 4 (L) >60 mL/min   Anion gap 11 5 - 15    Comment: Performed at [facility] [address] [place], [place] [address]  Glucose, capillary     Status: Abnormal   Collection Time: 06/13/19  6:03 AM  Result Value Ref Range   Glucose-Capillary 112 (H) 70 - 99 mg/dL  Glucose, capillary     Status: None   Collection Time: 06/13/19  4:34 PM  Result Value Ref Range   Glucose-Capillary 85 70 - 99 mg/dL   Comment 1 Notify RN    Comment 2 Document in Chart   Glucose, capillary      Status: Abnormal   Collection Time: 06/13/19  9:23 PM  Result Value Ref Range   Glucose-Capillary 157 (H) 70 - 99 mg/dL  Renal function panel     Status: Abnormal  Collection Time: 06/14/19  2:47 AM  Result Value Ref Range   Sodium 136 135 - 145 mmol/L   Potassium 4.2 3.5 - 5.1 mmol/L   Chloride 98 98 - 111 mmol/L   CO2 28 22 - 32 mmol/L   Glucose, Bld 107 (H) 70 - 99 mg/dL   BUN 23 (H) 6 - 20 mg/dL   Creatinine, Ser 7.50 (H) 0.61 - 1.24 mg/dL    Comment: DELTA CHECK NOTED   Calcium 8.1 (L) 8.9 - 10.3 mg/dL   Phosphorus 2.6 2.5 - 4.6 mg/dL   Albumin 3.0 (L) 3.5 - 5.0 g/dL   GFR calc non Af Amer 7 (L) >60 mL/min   GFR calc Af Amer 9 (L) >60 mL/min   Anion gap 10 5 - 15    Comment: Performed at [facility] [address] [place], [place] [address]  Hemoglobin and hematocrit, blood     Status: Abnormal   Collection Time: 06/14/19  2:47 AM  Result Value Ref Range   Hemoglobin 8.9 (L) 13.0 - 17.0 g/dL   HCT RESULTS UNAVAILABLE DUE TO INTERFERING SUBSTANCE 39.0 - 52.0 %    Comment: Performed at [facility] [address] [place], [place] [address]  Glucose, capillary     Status: Abnormal   Collection Time: 06/14/19  5:29 AM  Result Value Ref Range   Glucose-Capillary 106 (H) 70 - 99 mg/dL  Glucose, capillary     Status: Abnormal   Collection Time: 06/14/19 11:07 AM  Result Value Ref Range   Glucose-Capillary 111 (H) 70 - 99 mg/dL   No results found.     Medical Problem List and Plan: 1.  Impaired ADLs/mobility and function secondary to acute on chronic SDH with 5 mm of midline shift to left- with R hemiparesis and dysarthria- f/u with NSU after rehab; no head CT unless has a change in function, per NSU 2.  Antithrombotics: -DVT/anticoagulation:  Mechanical: Sequential compression devices, below knee Bilateral lower extremities and IVC filter  -antiplatelet therapy: N/A due to SDH 3. Pain Management:  Tylenol prn.  4. Mood: LCSW to follow for evaluation and  support.   -antipsychotic agents: N/A 5. Neuropsych: This patient is not fully capable of making decisions on his own behalf. 6. Skin/Wound Care: Routine pressure relief measures.  7. Fluids/Electrolytes/Nutrition: Strict I/O. Labs per HD protocol.   9. Proteus bacteremia/Recent aspiration PNA: Treated with Zosyn and transitioned to cipro on 9/24-->? Duration.   10. Seizure disorder: On Vimpat and Lamictal bid.  Post HD on MWF needs additional doses of clonazepam 0.5 mg and Lamictal 150 mg to prevent breakthrough seizures.  11. T2DM: Monitor BS ac/hs. Continue 12. ESRD: Continue HD MWF at the end of the day to help with tolerance of therapy.  13. HTN: Monitor BP tid--continue amlodipine and metoprolol bid.  14. Aemia of chronic disease: H/H stable at  15. H/o A fib/PFO: Monitor HR tid--on metoprolol. Was on ASA only. No coumadin due to recurrent GIB and was on ASA--d/c due to SDH. 16. H/o SS thalassemia/PE/DVT: IVC filter placed 09/2015.  17. Spasticity in setting of 2017 CVA with previous R hemiparesis- not clear if chronic or new- pt was insistent had no residual effects from 2017 CVA- will closely monitor and treat if affects function. 18. Multiple myeloma- was scheduled for autologous stem cell transplant at [facility]- will need f/u after rehab d/c. Labs also need to be monitored at least weekly for now. 19. B/L renal stones-  discovered at [facility]- no acute effects currently- will determine if they are causing any renal issues- if not, will need intermittent monitoring.  20. OSA on CPAP- will order CPAP as required for nighttime.       Bary Leriche, PA-C 06/14/2019

## 2019-06-14 NOTE — Chronic Care Management (AMB) (Signed)
°  Chronic Care Management   Outreach Note  06/14/2019 Name: Isaiah [name] Isaiah [name] [facility] MRN: [id number] DOB: Sep 27, 1965  Referred by: [name], MD Reason for referral : Chronic Care Management (Initial CCM outreach was unsuccessful.) and Chronic Care Management (Second CCM outreach )   A second unsuccessful telephone outreach was attempted today. The patient was referred to the case management team for assistance with care management and care coordination.   Follow Up Plan: The care management team will reach out to the patient again over the next 7 days.   [place]  [email or web address]   ??[phone]

## 2019-06-14 NOTE — Progress Notes (Signed)
Patient ID: [name], male   DOB: 01/14/1966, 53 y.o.   MRN: [id number] Vital signs are stable Patient's right-sided weakness appears stable Plan to transfer for CIR today Continue to follow peripherally

## 2019-06-14 NOTE — Progress Notes (Addendum)
Inpatient Rehabilitation Admissions Coordinator  Inpatient rehab bed is available to admit pt to today. I met with patient at bedside and spoke with his wife who are in agreement to admit. Dr. [place] Silos and RN CM, Steffanie Dunn, are aware. I will make the arrangements to admit today. Ulice Dash in hemodialysis aware of planned admit today.  Danne Baxter, RN, MSN Rehab Admissions Coordinator [phone] 06/14/2019 9:55 AM

## 2019-06-14 NOTE — Progress Notes (Signed)
Courtney Heys, MD  Physician  Physical Medicine and Rehabilitation  PMR Pre-admission  Signed  Date of Service:  06/14/2019 10:17 AM      Related encounter: ED to Hosp-Admission (Discharged) from 06/09/2019 in [facility] 4E CV SURGICAL PROGRESSIVE CARE      Signed         Show:Clear all [x] Manual[x] Template[x] Copied  Added by: [x] Cristina Gong, RN[x] Courtney Heys, MD  [] Hover for details PMR Admission Coordinator Pre-Admission Assessment  Patient: Isaiah [name] is an 53 y.o., male MRN: [id number] DOB: 1966-08-25 Height: 5' 10"  (177.8 cm) Weight: 84 kg  Insurance Information HMO:     PPO:      PCP:      IPA:      80/20:      OTHER:  PRIMARY: Medicare a and b      Policy#: [id number]      Subscriber: pt Benefits:  Phone #: passport one online     Name: 06/13/2019 Eff. Date: 12/08/2015     Deduct: $1408      Out of Pocket Max: none      Life Max: none CIR: 100%      SNF: 20 full days Outpatient: 80%     Co-Pay: 20% Home Health: 100%      Co-Pay: none DME: 80%     Co-Pay: 20% Providers: pt choice  SECONDARY: Janit Bern      Policy#: [id number]      Subscriber: wife/Sylvia  Medicaid Application Date:       Case Manager:  Disability Application Date:       Case Worker:   The Data Collection Information Summary for patients in Inpatient Rehabilitation Facilities with attached Privacy Act [place] Records was provided and verbally reviewed with: Patient and Family  Emergency Contact Information         Contact Information    Name Relation Home Work Mobile   [place] Spouse   Vincent Mother   [phone]      Current Medical History  Patient Admitting Diagnosis: SDH  History of Present Illness: 53 year old male with medical history significant for CVA, seizure disorder, OSA on CPAP, PE on coumadin, multiple myeloma, ESRD on Hemodialysis MWF, DM and a fib. Presented on 06/09/2019 with right facial droop, right  sided weakness and slurred speech. Unable to receive TPA due to recent SDH. Recently admitted to [facility] from 06/05/2019 until 9/29 for planned autologous stem cell transplant for multiple myeloma. However he had seizure activity following HD on 9/18 and head CT revealed an acute on chronic right sided SDH. Also found to have Proteus mirabilis bacteremia which was treated with Zosyn. Found to have renal stones, but urology deferred in the setting of bacteremia. Transitioned to Cipro for continuous prophylactic therapy. Noted also to have aspiration PNA. Patient changed to Vimpat and Depakote with Klonopin after HD for his seizures. Stem cell transplantation on hold due to his multiple medical issues.  Patient presented to [facility] with worsening symptoms with concern for progressive disease/new CVA. Head CT scan done on admit with increased midline shift to 4 mm from previous known 2 mm midline shift. MRI head done with right-sided subdural hematoma appearing predominantly subacute although there may be an area of more recent hemorrhage posteriorly based on high density on CT. Fluid measures up to 14 mm in thickness in the parietal lobe and 9 mm in thickness in the frontal lobe, 5 mm midline shift to the left. Negative for any  acute infarct. Generalized atrophy with chronic ischemia. Patient feels right-sided weakness slowly improving. Dysarthria improved. Right facial weakness improved. Repeat head CT done10/12/2018 with stable mixed density right-sided subdural hematoma since 06/09/2019. Stable intracranial mass-effect with leftward midline shift of 3 to 4 mm. No new intracranial abnormality. Chronic ischemia in the left MCA, right PCA and left cerebellar artery territories. Neurosurgery and Neurology following with conservative treatment. No seizures noted. Right sided weakness improving daily but not yet at baseline. Dysarthric speech improved.  Nephrology consulted with continued hemodialysis  MWF. DVT prophylaxis with SCDs.   Complete NIHSS TOTAL: 2  Patient's medical record from [facility]  has been reviewed by the rehabilitation admission coordinator and physician.  Past Medical History      Past Medical History:  Diagnosis Date   DM type 2 (diabetes mellitus, type 2) ([place]) 06/09/2019   Essential hypertension 06/09/2019   Multiple myeloma ([place])    Seizure disorder ([place]) 06/09/2019   Sepsis ([place])    Subdural hematoma ([facility])     Family History   family history includes Hypertension in his father and mother.  Prior Rehab/Hospitalizations Has the patient had prior rehab or hospitalizations prior to admission? Yes  Has the patient had major surgery during 100 days prior to admission? No              Previously at [name] 2017 after CVA  Current Medications  Current Facility-Administered Medications:     stroke: mapping our early stages of recovery book, , Does not apply, Once, Karmen Bongo, MD, Stopped at 06/09/19 1718   acetaminophen (TYLENOL) tablet 650 mg, 650 mg, Oral, Q4H PRN **OR** acetaminophen (TYLENOL) solution 650 mg, 650 mg, Per Tube, Q4H PRN **OR** acetaminophen (TYLENOL) suppository 650 mg, 650 mg, Rectal, Q4H PRN, Karmen Bongo, MD   acyclovir (ZOVIRAX) tablet 400 mg, 400 mg, Oral, QHS, Karmen Bongo, MD, 400 mg at 06/13/19 2125   amLODipine (NORVASC) tablet 5 mg, 5 mg, Oral, Daily, Karmen Bongo, MD, 5 mg at 06/14/19 8341   calcium acetate (PHOSLO) capsule 2,001 mg, 2,001 mg, Oral, TID WC, Karmen Bongo, MD, 2,001 mg at 06/14/19 9622   calcium carbonate (dosed in mg elemental calcium) suspension 500 mg of elemental calcium, 500 mg of elemental calcium, Oral, Q6H PRN, Karmen Bongo, MD   calcium carbonate (TUMS - dosed in mg elemental calcium) chewable tablet 400 mg of elemental calcium, 2 tablet, Oral, QHS, Karmen Bongo, MD, 400 mg of elemental calcium at 06/13/19 2125   camphor-menthol (SARNA) lotion 1  application, 1 application, Topical, W9N PRN **AND** hydrOXYzine (ATARAX/VISTARIL) tablet 25 mg, 25 mg, Oral, Q8H PRN, Karmen Bongo, MD   Chlorhexidine Gluconate Cloth 2 % PADS 6 each, 6 each, Topical, Q0600, Roney Jaffe, MD, 6 each at 06/14/19 9892   cinacalcet (SENSIPAR) tablet 60 mg, 60 mg, Oral, QPM, Karmen Bongo, MD, 60 mg at 06/13/19 1822   ciprofloxacin (CIPRO) tablet 500 mg, 500 mg, Oral, QPM, Karmen Bongo, MD, 500 mg at 06/13/19 1821   clonazePAM (KLONOPIN) tablet 0.5 mg, 0.5 mg, Oral, Q M,W,F-HD, Roney Jaffe, MD, 0.5 mg at 06/13/19 1525   docusate sodium (ENEMEEZ) enema 283 mg, 1 enema, Rectal, PRN, Karmen Bongo, MD   feeding supplement (NEPRO CARB STEADY) liquid 237 mL, 237 mL, Oral, TID PRN, Karmen Bongo, MD   feeding supplement (PRO-STAT SUGAR FREE 64) liquid 30 mL, 30 mL, Oral, TID BM, Eugenie Filler, MD, 30 mL at 06/13/19 2008   fluticasone furoate-vilanterol (BREO ELLIPTA) 200-25 MCG/INH 1  puff, 1 puff, Inhalation, Daily, Karmen Bongo, MD, 1 puff at 06/13/19 0843   insulin aspart (novoLOG) injection 0-5 Units, 0-5 Units, Subcutaneous, QHS, Karmen Bongo, MD   insulin aspart (novoLOG) injection 0-9 Units, 0-9 Units, Subcutaneous, TID WC, Karmen Bongo, MD, 2 Units at 06/11/19 1212   lacosamide (VIMPAT) tablet 150 mg, 150 mg, Oral, BID, Roney Jaffe, MD, 150 mg at 06/14/19 0813   lacosamide (VIMPAT) tablet 150 mg, 150 mg, Oral, Q M,W,F-HD, Roney Jaffe, MD, 150 mg at 06/13/19 1522   lamoTRIgine (LAMICTAL) tablet 150 mg, 150 mg, Oral, BID, Eugenie Filler, MD, 150 mg at 06/14/19 0954   metoprolol tartrate (LOPRESSOR) tablet 12.5 mg, 12.5 mg, Oral, BID, Karmen Bongo, MD, 12.5 mg at 06/14/19 6712   multivitamin (RENA-VIT) tablet 1 tablet, 1 tablet, Oral, QHS, Karmen Bongo, MD, 1 tablet at 06/13/19 2125   pantoprazole (PROTONIX) EC tablet 40 mg, 40 mg, Oral, Daily, Karmen Bongo, MD, 40 mg at 06/14/19 4580    prochlorperazine (COMPAZINE) tablet 10 mg, 10 mg, Oral, Q6H PRN, Karmen Bongo, MD   senna-docusate (Senokot-S) tablet 1 tablet, 1 tablet, Oral, QHS PRN, Karmen Bongo, MD   sorbitol 70 % solution 30 mL, 30 mL, Oral, PRN, Karmen Bongo, MD   zolpidem Lorrin Mais) tablet 5 mg, 5 mg, Oral, QHS PRN, Karmen Bongo, MD, 5 mg at 06/09/19 2308  Patients Current Diet:     Diet Order                  Diet heart healthy/carb modified Fluid consistency: Thin  Diet effective ____               Precautions / Restrictions Precautions Precautions: Fall Restrictions Weight Bearing Restrictions: No   Has the patient had 2 or more falls or a fall with injury in the past year? No  Prior Activity Level Community (5-7x/wk): Indpendent without AD, does not drive  Prior Functional Level Self Care: Did the patient need help bathing, dressing, using the toilet or eating? Independent  Indoor Mobility: Did the patient need assistance with walking from room to room (with or without device)? Independent  Stairs: Did the patient need assistance with internal or external stairs (with or without device)? Independent  Functional Cognition: Did the patient need help planning regular tasks such as shopping or remembering to take medications? Needed some help  Home Assistive Devices / [place] Devices/Equipment: Bedside commode/3-in-1, Gilford Rile (specify type), Hand-held shower hose, Grab bars in shower, Raised toilet seat with rails, Blood pressure cuff, CBG Meter, Scales Home Equipment: Grab bars - tub/shower, Hand held shower head, Walker - 4 wheels  Prior Device Use: Indicate devices/aids used by the patient prior to current illness, exacerbation or injury? Walker intermittently only  Current Functional Level Cognition  Overall Cognitive Status: Within Functional Limits for tasks assessed Orientation Level: Oriented X4    Extremity Assessment (includes  Sensation/Coordination)  Upper Extremity Assessment: RUE deficits/detail RUE Deficits / Details: Slowness of movement compared to LUE and decreased coordination RUE Sensation: WNL RUE Coordination: decreased fine motor, decreased gross motor  Lower Extremity Assessment: RLE deficits/detail RLE Deficits / Details: grossly 4/5    ADLs  Overall ADL's : Needs assistance/impaired Eating/Feeding: Modified independent, Sitting Eating/Feeding Details (indicate cue type and reason): in recliner, increased time due to slower movement of RUE Grooming: Minimal assistance, Sitting Grooming Details (indicate cue type and reason): in recliner, increased time due to slower movement of RUE Upper Body Bathing: Minimal assistance Upper Body Bathing  Details (indicate cue type and reason): in recliner, increased time due to slower movement of RUE Lower Body Bathing: Moderate assistance Lower Body Bathing Details (indicate cue type and reason): min A sit<>stand but mod A for standing balance Upper Body Dressing : Minimal assistance, Sitting Upper Body Dressing Details (indicate cue type and reason): in recliner, increased time due to slower movement of RUE Lower Body Dressing: Maximal assistance Lower Body Dressing Details (indicate cue type and reason): min A sit<>stand but mod A for standing balance Toilet Transfer: Moderate assistance, Ambulation, RW Toileting- Clothing Manipulation and Hygiene: Maximal assistance Toileting - Clothing Manipulation Details (indicate cue type and reason): min A sit<>stand but mod A for standing balance    Mobility  Overal bed mobility: Needs Assistance Bed Mobility: Supine to Sit Supine to sit: Min guard General bed mobility comments: Was able to come to EOB with use of rail and incr time    Transfers  Overall transfer level: Needs assistance Equipment used: Rolling walker (2 wheeled) Transfers: Sit to/from Stand Sit to Stand: Min assist, +2 safety/equipment,  From elevated surface General transfer comment: Pt needed steadying assist to rise as he was leaning posteriorly iniitally.  Relies on RW for balance    Ambulation / Gait / Stairs / Wheelchair Mobility  Ambulation/Gait Ambulation/Gait assistance: Min assist, Mod assist, +2 safety/equipment Gait Distance (Feet): 45 Feet Assistive device: Rolling walker (2 wheeled) Gait Pattern/deviations: Step-through pattern, Decreased stride length, Decreased weight shift to right, Antalgic, Leaning posteriorly General Gait Details: Pt was able to walk into hallway with the RW with +2 min assist initially.  As pt fatigued, noted that pt with right LE buckling and needed mod assist with one LOB prior to getting back to chair.  Close +2 assist warranted.  Needed assist to control descent into chair and cues for hand placement as well.  Gait velocity interpretation: <1.31 ft/sec, indicative of household ambulator    Posture / Balance Balance Overall balance assessment: Needs assistance Sitting-balance support: No upper extremity supported, Feet supported Sitting balance-Leahy Scale: Fair Postural control: Posterior lean Standing balance support: Bilateral upper extremity supported, During functional activity Standing balance-Leahy Scale: Poor Standing balance comment: relies on UE support on RW static and dynamic as well as at least +1 external support    Special needs/care consideration BiPAP/CPAP yes CPM  N/a Continuous Drip IV  N/a Dialysis hemodialysis MWF [facility]; SCAT transport 1015 am Life Vest n/a Oxygen  N/a Special Bed  N/a Trach Size  N/a Wound Vac n/a Skin  RUE fistula Bowel mgmt:  Continent LBM 10/4 Bladder mgmt: anuric Diabetic mgmt: yes DM type 2 Behavioral consideration  N/a Chemo/radiation  N/a Multiple Myeloma with stem cell transplant pending at [facility] visitor is wife, [name]   Previous Home Environment  Living Arrangements: Spouse/significant other, Children   Lives With: Spouse, Son(44 year old son, [place] lives with parents) Available Help at Discharge: Family, Available 24 hours/day Type of Home: House Home Layout: Two level, 1/2 bath on main level, Bed/bath upstairs Alternate Level Stairs-Number of Steps: flight Home Access: Stairs to enter Entrance Stairs-Rails: Right Entrance Stairs-Number of Steps: 2 Bathroom Shower/Tub: Gaffer, Charity fundraiser: Standard Bathroom Accessibility: Yes How Accessible: Accessible via walker Home Care Services: No  Discharge Living Setting Plans for Discharge Living Setting: Patient's home, Lives with (comment)(spouse and 33 year old son) Type of Home at Discharge: House Discharge Home Layout: Two level, Able to live on main level with bedroom/bathroom, Full bath on main  level Alternate Level Stairs-Rails: Right Alternate Level Stairs-Number of Steps: flight Discharge Home Access: Stairs to enter Entrance Stairs-Rails: Right Entrance Stairs-Number of Steps: 2 Discharge Bathroom Shower/Tub: Walk-in shower, Door Discharge Bathroom Toilet: Standard Discharge Bathroom Accessibility: Yes How Accessible: Accessible via walker Does the patient have any problems obtaining your medications?: No  Social/Family/Support Systems Patient Roles: Spouse, Parent Contact Information: wife, [name] Anticipated Caregiver: wife and son Anticipated Caregiver's Contact Information: [phone] [name] Ability/Limitations of Caregiver: wife works at [employer] days, S[employer] taking classes [employer] remotely Caregiver Availability: 24/7 Discharge Plan Discussed with Primary Caregiver: Yes Is Caregiver In Agreement with Plan?: Yes Does Caregiver/Family have Issues with Lodging/Transportation while Pt is in Rehab?: No  Goals/Additional Needs Patient/Family Goal for Rehab: Mod I to supervision with PT, OT, and SLP Expected length of stay: ELOS 10 to 12 days Special Service Needs:  Hemodialysis MWF Pt/Family Agrees to Admission and willing to participate: Yes Program Orientation Provided & Reviewed with Pt/Caregiver Including Roles  & Responsibilities: Yes  Decrease burden of Care through IP rehab admission: n/a  Possible need for SNF placement upon discharge:  Not anticipated  Patient Condition: I have reviewed medical records from [facility] , spoken with CM, and patient and spouse. I met with patient at the bedside for inpatient rehabilitation assessment.  Patient will benefit from ongoing PT, OT and SLP, can actively participate in 3 hours of therapy a day 5 days of the week, and can make measurable gains during the admission.  Patient will also benefit from the coordinated team approach during an Inpatient Acute Rehabilitation admission.  The patient will receive intensive therapy as well as Rehabilitation physician, nursing, social worker, and care management interventions.  Due to bladder management, bowel management, safety, skin/wound care, disease management, medication administration, pain management and patient education the patient requires 24 hour a day rehabilitation nursing.  The patient is currently min to mod assist with mobility and basic ADLs.  Discharge setting and therapy post discharge at home with home health is anticipated.  Patient has agreed to participate in the Acute Inpatient Rehabilitation Program and will admit today.  Preadmission Screen Completed By:  Cleatrice Burke, 06/14/2019 10:17 AM ______________________________________________________________________   Discussed status with Dr. Dagoberto Ligas on  06/14/2019 at 1035 and received approval for admission today.  Admission Coordinator:  Cleatrice Burke, RN, time  6213 Date  06/14/2019   Assessment/Plan: Diagnosis: 1. Does the need for close, 24 hr/day Medical supervision in concert with the patient's rehab needs make it unreasonable for this patient to be served in a  less intensive setting? Yes 2. Co-Morbidities requiring supervision/potential complications: Afib, Multiple myeloma, DM, Seizures D/O, ESRD on HD, hx of CVA 2017 3. Due to bladder management, bowel management, safety, skin/wound care, disease management, medication administration and patient education, does the patient require 24 hr/day rehab nursing? Yes 4. Does the patient require coordinated care of a physician, rehab nurse, PT (1-2 hrs/day, 5 days/week), OT (1-2 hrs/day, 5 days/week) and SLP (1 hrs/day, 5 days/week) to address physical and functional deficits in the context of the above medical diagnosis(es)? Yes Addressing deficits in the following areas: balance, endurance, locomotion, transferring, bowel/bladder control, bathing, dressing, grooming, cognition, speech and language 5. Can the patient actively participate in an intensive therapy program of at least 3 hrs of therapy 5 days a week? Yes 6. The potential for patient to make measurable gains while on inpatient rehab is good 7. Anticipated functional outcomes upon  discharge from inpatients are: supervision PT, supervision OT, supervision SLP 8. Estimated rehab length of stay to reach the above functional goals is: 10-12 days 9. Anticipated D/C setting: Home 10. Anticipated post D/C treatments: [place] therapy 11. Overall Rehab/Functional Prognosis: good  MD Signature:         Revision History

## 2019-06-14 NOTE — Progress Notes (Signed)
Occupational Therapy Treatment Patient Details Name: Isaiah [name] MRN: [id number] DOB: 09/25/1965 Today's Date: 06/14/2019    History of present illness Pt is a 53 y.o. male admitted 06/09/19 with R-side weakness and slurred speach. Pt with recent SDH; was admitted at [facility] 9/17-9/29/20 for planned autologous stem cell transplant for multiple myeloma, but had seizure activity after HD on 9/18 (head CT then revealed acute on chronic SDH). CT on 10/1 showed 53m midline shift to the left. Other PMH includes CVA, OSA, PE, HTN, ESRD (HD MWF), DM.   OT comments  Patient progressing well.  Continues to be limited by R UE weakness, impaired balance, and decreased activity tolerance.  Noted knee buckling and R lateral lean with sustained activity.  Min assist for grooming at sink, with minimal cueing to locate items on R side of side. Cueing for problem solving, RW mgmt and safety throughout session. Will follow acutely.    Follow Up Recommendations  CIR;Supervision/Assistance - 24 hour    Equipment Recommendations  Other (comment)(TBD at next venue of care)    Recommendations for Other Services      Precautions / Restrictions Precautions Precautions: Fall Restrictions Weight Bearing Restrictions: No       Mobility Bed Mobility Overal bed mobility: Needs Assistance Bed Mobility: Supine to Sit     Supine to sit: Min guard;HOB elevated     General bed mobility comments: Increased time with HOB elevated and use of bed rail  Transfers Overall transfer level: Needs assistance Equipment used: Rolling walker (2 wheeled) Transfers: Sit to/from Stand Sit to Stand: Mod assist         General transfer comment: requires cueing for hand placement and technique, requires mod assist to ascend after several trials     Balance Overall balance assessment: Needs assistance Sitting-balance support: No upper extremity supported;Feet supported Sitting balance-Leahy Scale: Fair     Standing  balance support: Single extremity supported;No upper extremity supported;During functional activity Standing balance-Leahy Scale: Poor Standing balance comment: preference to at least 1 UE support statically, requires external support and reliant on BUE dynamcailly and during mobility                           ADL either performed or assessed with clinical judgement   ADL Overall ADL's : Needs assistance/impaired     Grooming: Minimal assistance;Standing Grooming Details (indicate cue type and reason): standing at sink to complete oral care and washing face, preference to at least 1 UE support; encouarged using R dominant UE to brush teeth but pt easily frustrated and switched to L hand (then weightbearing through R UE in standing); able to squeeze washcloth with BUEs before washing face; cueing for balance and noted R sided lean and knee buckling with fatigue                 Toilet Transfer: Moderate assistance;Ambulation;RW Toilet Transfer Details (indicate cue type and reason): simulated to recliner          Functional mobility during ADLs: Minimal assistance;Rolling walker;Cueing for safety General ADL Comments: pt limited by weakness (R), impaired balance     Vision   Additional Comments: cueing to locate items on R at sink   Perception     Praxis      Cognition Arousal/Alertness: Awake/alert Behavior During Therapy: Flat affect Overall Cognitive Status: Within Functional Limits for tasks assessed  Exercises     Shoulder Instructions       General Comments      Pertinent Vitals/ Pain       Pain Assessment: No/denies pain  Home Living   Living Arrangements: Spouse/significant other;Children                       Bathroom Accessibility: Yes How Accessible: Accessible via walker        Lives With: Spouse;Son(98 year old son, [place] lives with parents)    Prior  Functioning/Environment          Comments: did not use RW at all times per wife, Independent prior to recent [facility] admit   Frequency  Min 2X/week        Progress Toward Goals  OT Goals(current goals can now be found in the care plan section)  Progress towards OT goals: Progressing toward goals  Acute Rehab OT Goals Patient Stated Goal: to go home OT Goal Formulation: With patient  Plan Discharge plan remains appropriate;Frequency remains appropriate    Co-evaluation    PT/OT/SLP Co-Evaluation/Treatment: Yes Reason for Co-Treatment: To address functional/ADL transfers;Necessary to address cognition/behavior during functional activity PT goals addressed during session: Mobility/safety with mobility OT goals addressed during session: ADL's and self-care      AM-PAC OT "6 Clicks" Daily Activity     Outcome Measure   Help from another person eating meals?: None Help from another person taking care of personal grooming?: A Little Help from another person toileting, which includes using toliet, bedpan, or urinal?: A Lot Help from another person bathing (including washing, rinsing, drying)?: A Little Help from another person to put on and taking off regular upper body clothing?: A Little Help from another person to put on and taking off regular lower body clothing?: A Lot 6 Click Score: 17    End of Session Equipment Utilized During Treatment: Gait belt;Rolling walker  OT Visit Diagnosis: Unsteadiness on feet (R26.81);Other abnormalities of gait and mobility (R26.89);Muscle weakness (generalized) (M62.81);Low vision, both eyes (H54.2)   Activity Tolerance Patient tolerated treatment well   Patient Left in chair;with call bell/phone within reach;with chair alarm set   Nurse Communication Mobility status        Time: 1683-7290 OT Time Calculation (min): 23 min  Charges: OT General Charges $OT Visit: 1 Visit OT Treatments $Self Care/Home Management : 8-22  mins  Delight Stare, [place] Pager [phone] Office [phone]    Delight Stare 06/14/2019, 1:44 PM

## 2019-06-15 ENCOUNTER — Inpatient Hospital Stay (HOSPITAL_COMMUNITY): Payer: Medicare Other

## 2019-06-15 ENCOUNTER — Telehealth: Payer: Self-pay

## 2019-06-15 ENCOUNTER — Inpatient Hospital Stay (HOSPITAL_COMMUNITY): Payer: Medicare Other | Admitting: Occupational Therapy

## 2019-06-15 DIAGNOSIS — S065X9A Traumatic subdural hemorrhage with loss of consciousness of unspecified duration, initial encounter: Secondary | ICD-10-CM

## 2019-06-15 LAB — CBC
Hemoglobin: 9 g/dL — ABNORMAL LOW (ref 13.0–17.0)
Platelets: 288 10*3/uL (ref 150–400)
WBC: 7 10*3/uL (ref 4.0–10.5)

## 2019-06-15 LAB — HEMOGLOBIN A1C
Hgb A1c MFr Bld: 4.8 % (ref 4.8–5.6)
Mean Plasma Glucose: 91 mg/dL

## 2019-06-15 LAB — GLUCOSE, CAPILLARY
Glucose-Capillary: 104 mg/dL — ABNORMAL HIGH (ref 70–99)
Glucose-Capillary: 123 mg/dL — ABNORMAL HIGH (ref 70–99)
Glucose-Capillary: 98 mg/dL (ref 70–99)
Glucose-Capillary: 109 mg/dL — ABNORMAL HIGH (ref 70–99)

## 2019-06-15 LAB — RENAL FUNCTION PANEL
Albumin: 3.4 g/dL — ABNORMAL LOW (ref 3.5–5.0)
Anion gap: 14 (ref 5–15)
BUN: 44 mg/dL — ABNORMAL HIGH (ref 6–20)
CO2: 26 mmol/L (ref 22–32)
Calcium: 8.7 mg/dL — ABNORMAL LOW (ref 8.9–10.3)
Chloride: 97 mmol/L — ABNORMAL LOW (ref 98–111)
Creatinine, Ser: 11.18 mg/dL — ABNORMAL HIGH (ref 0.61–1.24)
GFR calc Af Amer: 5 mL/min — ABNORMAL LOW (ref >60)
GFR calc non Af Amer: 5 mL/min — ABNORMAL LOW (ref >60)
Glucose, Bld: 145 mg/dL — ABNORMAL HIGH (ref 70–99)
Phosphorus: 2.6 mg/dL (ref 2.5–4.6)
Potassium: 4.8 mmol/L (ref 3.5–5.1)
Sodium: 137 mmol/L (ref 135–145)

## 2019-06-15 MED ORDER — CLONAZEPAM 0.5 MG PO TABS
ORAL_TABLET | ORAL | Status: AC
  Filled 2019-06-15: qty 1

## 2019-06-15 NOTE — Progress Notes (Signed)
[place] PHYSICAL MEDICINE & REHABILITATION PROGRESS NOTE   Subjective/Complaints: Pt seen in 2018 in office setting , reviewed office notes  ROS- neg CP, SOB, N/V/D   Objective:   No results found. Recent Labs    06/13/19 0346 06/14/19 0247  WBC 5.8  --   HGB 9.0* 8.9*  HCT RESULTS UNAVAILABLE DUE TO INTERFERING SUBSTANCE RESULTS UNAVAILABLE DUE TO INTERFERING SUBSTANCE  PLT 290  --    Recent Labs    06/13/19 0346 06/14/19 0247  NA 139 136  K 4.9 4.2  CL 100 98  CO2 28 28  GLUCOSE 139* 107*  BUN 48* 23*  CREATININE 12.80* 7.50*  CALCIUM 8.3* 8.1*   No intake or output data in the 24 hours ending 06/15/19 0740   Physical Exam: Vital Signs Blood pressure 105/63, pulse 91, temperature 98.9 F (37.2 C), resp. rate 14, height 6' (1.829 m), weight 87.2 kg, SpO2 99 %.   General: No acute distress Mood and affect are appropriate Heart: Regular rate and rhythm no rubs murmurs or extra sounds Lungs: Clear to auscultation, breathing unlabored, no rales or wheezes Abdomen: Positive bowel sounds, soft nontender to palpation, nondistended Extremities: No clubbing, cyanosis, or edema Skin: No evidence of breakdown, no evidence of rash Neurologic: Cranial nerves II through XII intact, motor strength is 5/5 in left deltoid, bicep, tricep, grip, hip flexor, knee extensors, ankle dorsiflexor and plantar flexor 4/5 RUE, 5/5/RLE Sensory exam normal sensation to light touch and proprioception in bilateral upper and lower extremities Cerebellar exam normal finger to nose to finger as well as heel to shin in bilateral upper and lower extremities Musculoskeletal: Full range of motion in all 4 extremities. No joint swelling   Assessment/Plan: 1. Functional deficits secondary to SDH which require 3+ hours per day of interdisciplinary therapy in a comprehensive inpatient rehab setting.  Physiatrist is providing close team supervision and 24 hour management of active medical problems  listed below.  Physiatrist and rehab team continue to assess barriers to discharge/monitor patient progress toward functional and medical goals  Care Tool:  Bathing              Bathing assist       Upper Body Dressing/Undressing Upper body dressing        Upper body assist      Lower Body Dressing/Undressing Lower body dressing            Lower body assist       Toileting Toileting    Toileting assist       Transfers Chair/bed transfer  Transfers assist     Chair/bed transfer assist level: Minimal Assistance - Patient > 75%     Locomotion Ambulation   Ambulation assist              Walk 10 feet activity   Assist           Walk 50 feet activity   Assist           Walk 150 feet activity   Assist           Walk 10 feet on uneven surface  activity   Assist           Wheelchair     Assist               Wheelchair 50 feet with 2 turns activity    Assist            Wheelchair 150 feet activity  Assist          Blood pressure 105/63, pulse 91, temperature 98.9 F (37.2 C), resp. rate 14, height 6' (1.829 m), weight 87.2 kg, SpO2 99 %.    Medical Problem List and Plan: 1.  Impaired ADLs/mobility and function secondary to acute on chronic SDH with 5 mm of midline shift to left- with R hemiparesis and dysarthria- f/u with NSU after rehab; no head CT unless has a change in function, per NSU Strength and tone unchanged compared to my last office visit 09/18/2016 Rehab evals today  2.  Antithrombotics: -DVT/anticoagulation:  Mechanical: Sequential compression devices, below knee Bilateral lower extremities and IVC filter             -antiplatelet therapy: N/A due to SDH 3. Pain Management:  Tylenol prn.  4. Mood: LCSW to follow for evaluation and support.              -antipsychotic agents: N/A 5. Neuropsych: This patient is not fully capable of making decisions on his own behalf. 6.  Skin/Wound Care: Routine pressure relief measures.  7. Fluids/Electrolytes/Nutrition: Strict I/O. Labs per HD protocol.   9. Proteus bacteremia/Recent aspiration PNA: Treated with Zosyn and transitioned to cipro on 9/24-->? Duration.   10. Seizure disorder: On Vimpat and Lamictal bid.  Post HD on MWF needs additional doses of clonazepam 0.5 mg and Lamictal 150 mg to prevent breakthrough seizures.  11. T2DM: Monitor BS ac/hs. Continue 12. ESRD: Continue HD MWF at the end of the day to help with tolerance of therapy.  13. HTN: Monitor BP tid--continue amlodipine and metoprolol bid.  14. Aemia of chronic disease: H/H stable at  15. H/o A fib/PFO: Monitor HR tid--on metoprolol. Was on ASA only. No coumadin due to recurrent GIB and was on ASA--d/c due to SDH. Vitals:   06/14/19 1942 06/15/19 0442  BP: (!) 150/80 105/63  Pulse: 70 91  Resp: 17 14  Temp: 98.3 F (36.8 C) 98.9 F (37.2 C)  SpO2: 99% 99%  HR Controlled 10/7 16. H/o SS thalassemia/PE/DVT: IVC filter placed 09/2015.  17. Spasticity in setting of 2017 CVA with previous R hemiparesis-  chronic 18. Multiple myeloma- was scheduled for autologous stem cell transplant at [facility]- will need f/u after rehab d/c. Labs also need to be monitored at least weekly for now. 19. B/L renal stones- discovered at [facility]- no acute effects currently- will determine if they are causing any renal issues- if not, will need intermittent monitoring.  20. OSA on CPAP- will order CPAP as required for nighttime.  LOS: 1 days A FACE TO FACE EVALUATION WAS PERFORMED  Charlett Blake 06/15/2019, 7:40 AM   HR controlled

## 2019-06-15 NOTE — Evaluation (Signed)
Speech Language Pathology Assessment and Plan  Patient Details  Name: Isaiah [name] [name] MRN: [id number] Date of Birth: 1966/03/17  SLP Diagnosis: Dysarthria;Speech and Language deficits  Rehab Potential: Good ELOS: 10-12 days    Today's Date: 06/15/2019 SLP Individual Time: 0803-0900 SLP Individual Time Calculation (min): 57 min   Problem List:  Patient Active Problem List   Diagnosis Date Noted  . Right sided weakness   . SDH (subdural hematoma) ([place])   . Acute on chronic intracranial subdural hematoma (HCC) 06/09/2019  . Multiple myeloma not having achieved remission ([place]) 06/09/2019  . ESRD on hemodialysis ([place]) 06/09/2019  . Essential hypertension 06/09/2019  . DM type 2 (diabetes mellitus, type 2) ([place]) 06/09/2019  . Seizure disorder ([place]) 06/09/2019  . Aspiration pneumonia ([place]) 06/09/2019  . History of bacteremia 06/09/2019  . Atrial fibrillation, chronic ([place]) 06/09/2019  . Multiple myeloma ([place]) 05/17/2018  . Goals of care, counseling/discussion 05/17/2018  . Partial idiopathic epilepsy with seizures of localized onset, intractable, without status epilepticus ([place]) 11/02/2017  . Iron deficiency anemia, unspecified 01/22/2017  . Hemiparesis affecting right side as late effect of cerebrovascular accident ([place]) 02/28/2016  . Aphasia complicating stroke 93/73/4287  . Seizures ([place]) 01/08/2016  . History of ischemic left MCA stroke 01/08/2016  . Right sided weakness 01/08/2016  . Atrial fibrillation ([place]) 01/08/2016  . Leukocytosis 01/08/2016  . Benign essential HTN 01/08/2016  . Anemia of chronic disease 01/08/2016  . Controlled type 2 diabetes mellitus with diabetic nephropathy, without long-term current use of insulin ([place]) 01/08/2016  . History of DVT (deep vein thrombosis) 01/08/2016  . ESRD (end stage renal disease) on dialysis [name])    Past Medical History:  Past Medical History:  Diagnosis Date  . A-fib ([place])   . Anemia   . Asthma   . Diabetes mellitus  without complication ([place])   . DM type 2 (diabetes mellitus, type 2) ([place]) 06/09/2019  . ESRD (end stage renal disease) on dialysis ([place])   . Essential hypertension 06/09/2019  . GIB (gastrointestinal bleeding)    Recurrent episodes- 09/2014, 09/2015 and 05/2016  . Gout   . History of recent blood transfusion 10/27/14   2 Units PRBC's  . Hyperkalemia 07/2011  . Hypertension   . Monoclonal gammopathy   . Multiple myeloma ([place])   . OSA on CPAP   . Pulmonary emboli ([place]) 07/2011   bilateral--treated with comadin X 6 months  . Pulmonary embolism ([place]) 07/2011; 09/27/2014   a. Bilat PE 07/2011 - unclear cause, tx with 6 months Coumadin.;   . Seizure disorder ([place]) 06/09/2019  . Seizures ([place])   . Sepsis ([place])   . Sickle cell-thalassemia disease ([place])    a. Sickle cell trait.  . Sleep apnea   . Stroke ([place])   . Stroke ([place]) 09/2015   R-MCA, L-MCA, PCA and bilateral cerebellar complicated by DVT/PE  . Subdural hematoma ([place]) 05/2019   Past Surgical History:  Past Surgical History:  Procedure Laterality Date  . AV FISTULA PLACEMENT Right 12/05/2015   Procedure: INSERTION OF ARTERIOVENOUS (AV) GORE-TEX GRAFT ARM;  Surgeon: Elam Dutch, MD;  Location: [place];  Service: Vascular;  Laterality: Right;  . BONE MARROW BIOPSY    . CHOLECYSTECTOMY    . CHOLECYSTECTOMY  1990's?  . COLONOSCOPY WITH PROPOFOL N/A 01/22/2017   Procedure: COLONOSCOPY WITH PROPOFOL;  Surgeon: Wilford Corner, MD;  Location: [facility] ENDOSCOPY;  Service: Endoscopy;  Laterality: N/A;  . EYE SURGERY Right   . INSERTION OF DIALYSIS CATHETER  Right 10/16/2015   Procedure: INSERTION OF PALINDROME DIALYSIS CATHETER ;  Surgeon: Elam Dutch, MD;  Location: [place];  Service: Vascular;  Laterality: Right;  . OTHER SURGICAL HISTORY     Retinal surgery  . PARS PLANA VITRECTOMY  02/17/2012   Procedure: PARS PLANA VITRECTOMY WITH 25 GAUGE;  Surgeon: Hayden Pedro, MD;  Location: [facility];  Service: Ophthalmology;  Laterality: Right;   . PERIPHERAL VASCULAR CATHETERIZATION N/A 03/20/2016   Procedure: A/V Shuntogram/Fistulagram;  Surgeon: Conrad Lynn, MD;  Location: [place] CV LAB;  Service: Cardiovascular;  Laterality: N/A;  . TEE WITHOUT CARDIOVERSION N/A 12/02/2016   Procedure: TRANSESOPHAGEAL ECHOCARDIOGRAM (TEE);  Surgeon: Larey Dresser, MD;  Location[facility] ENDOSCOPY;  Service: Cardiovascular;  Laterality: N/A;    Assessment / Plan / Recommendation Clinical Impression 53 year old male with medical history significant for CVA, seizure disorder, OSA on CPAP, PE on coumadin, multiple myeloma, ESRD on Hemodialysis MWF, DM and a fib. Presented on 06/09/2019 with right facial droop, right sided weakness and slurred speech. Unable to receive TPA due to recent SDH. Recently admitted to [name] from 06/05/2019 until 9/29 for planned autologous stem cell transplant for multiple myeloma. However he had seizure activity following HD on 9/18 and head CT revealed an acute on chronic right sided SDH. Also found to have Proteus mirabilis bacteremia which was treated with Zosyn. Found to have renal stones, but urology deferred in the setting of bacteremia. Transitioned to Cipro for continuous prophylactic therapy. Noted also to have aspiration PNA. Patient changed to Vimpat and Depakote with Klonopin after HD for his seizures. Stem cell transplantation on hold due to his multiple medical issues.  Patient presented to [facility] with worsening symptoms with concern for progressive disease/new CVA. Head CT scan done on admit with increased midline shift to 4 mm from previous known 2 mm midline shift.MRI head done with right-sided subdural hematoma appearing predominantly subacute although there may be an area of more recent hemorrhage posteriorly based on high density on CT. Fluid measures up to 14 mm in thickness in the parietal lobe and 9 mm in thickness in the frontal lobe, 5 mm midline shift to the left. Negative for any acute infarct.  Generalized atrophy with chronic ischemia. Patient feels right-sided weakness slowly improving. Dysarthria improved. Right facial weakness improved. Repeat head CT done10/12/2018 with stable mixed density right-sided subdural hematoma since 06/09/2019. Stable intracranial mass-effect with leftward midline shift of 3 to 4 mm. No new intracranial abnormality. Chronic ischemia in the left MCA, right PCA and left cerebellar artery territories. Neurosurgeryand Neurology following with conservative treatment. No seizures noted. Right sided weakness improving daily but not yet at baseline. Dysarthric speech improved.  Pt presents with severe-mild cognitive linguistic impairments, deficits include basic problem solving, safety/emergent awareness, sustained attention, and short term recall. Pt demonstrated severe impairment in semi-complex problem solving (as well as basic problem solving on MOCA version 71. subsection), severe impairment in short term recall (0/4, 2/4 with cues) and mild impairments in attention and calculations, the remaining sections WFL on Cognistat. Pt initially denied cognitive changes, however demonstrated increase insight and emergent awareness throughout the assessment. In communication with evaluating OT pt demonstrated increase problem solving skills during functional tasks. Pt presents with mild dysarthria at sentence level with 80% intelligibility given min A verbal cues to slow rate and correct imprecise articulation. Pt's wife via phone stated rated intelligibility 80% near baseline. Pt would benefit from skill ST services in order to maximize  functional independence and reduce burden of care.    Skilled Therapeutic Interventions          Skilled ST services focused on speech skills. SLP educated pt on speech intelligibility strategies to slow rate and correct imprecise articulation errors, pt required min A verbal cues to correct errors at sentence level in repetition exercise.  All questions were answered to satisfaction. Pt was left in room with call bell within reach and bed/chair alarm set. ST recommends to continue skilled ST services.  SLP Assessment  Patient will need skilled Speech Lanaguage Pathology Services during CIR admission    Recommendations  SLP Diet Recommendations: Thin Liquid Administration via: Cup;Straw Medication Administration: Whole meds with liquid Supervision: Patient able to self feed Compensations: Minimize environmental distractions;Slow rate;Small sips/bites Postural Changes and/or Swallow Maneuvers: Seated upright 90 degrees Oral Care Recommendations: Oral care BID Recommendations for Other Services: Neuropsych consult Patient destination: Home Follow up Recommendations: Home Health SLP;Outpatient SLP;24 hour supervision/assistance Equipment Recommended: None recommended by SLP    SLP Frequency 3 to 5 out of 7 days   SLP Duration  SLP Intensity  SLP Treatment/Interventions 10-12 days  Minumum of 1-2 x/day, 30 to 90 minutes  Cognitive remediation/compensation;Cueing hierarchy;Functional tasks;Patient/family education;Internal/external aids;Speech/Language facilitation    Pain Pain Assessment Pain Score: 0-No pain  Prior Functioning Cognitive/Linguistic Baseline: Baseline deficits Baseline deficit details: previous CVA, attended outpatient speech therapy (discharged two years ago per patient) Type of Home: House  Lives With: Spouse;Son Available Help at Discharge: Family;Available 24 hours/day Vocation: Retired  Industrial/product designer Term Goals: Week 1: SLP Short Term Goal 1 (Week 1): Pt will recall novel, daily information with compensatory aid with mod A verbal cues. SLP Short Term Goal 2 (Week 1): Pt will demonstrate sustained attention to functional task in 30 minute intervals with supervision A verbal cues for redirection. SLP Short Term Goal 3 (Week 1): Pt will complete basic functional problem solving tasks (ex: ADLs) with min  A verbal cues. SLP Short Term Goal 4 (Week 1): Pt will self-monitor and self-correct functional errors in problem solving tasks with mod A verbal cues. SLP Short Term Goal 5 (Week 1): Pt will demonstrate use of speech intelligibility strategies at sentence level for 85% intelligibility with supervision A verbal cues to slow rate and correct articulation errors. SLP Short Term Goal 6 (Week 1): Pt will complete higher level word finding tasks with min A verbal cues for use of strategies.  Refer to Care Plan for Long Term Goals  Recommendations for other services: None  and Neuropsych  Discharge Criteria: Patient will be discharged from SLP if patient refuses treatment 3 consecutive times without medical reason, if treatment goals not met, if there is a change in medical status, if patient makes no progress towards goals or if patient is discharged from hospital.  The above assessment, treatment plan, treatment alternatives and goals were discussed and mutually agreed upon: by patient  Nalani Andreen  [facility] 06/15/2019, 5:38 PM

## 2019-06-15 NOTE — Progress Notes (Signed)
[facility] Progress Note   Assessment/ Plan:   Outpt HD:MWF  4h 400/800 92kg 2/2 bath  AVFHep 2600 (NO HEP here due to SDH) mircera 225 q2wks,  calcitriol 1.75 ug tiw venofer 50 /wk    Assessment/ Plan: 1. AMS/ acute on chronic SDH - per primary/ neuro/ NSurg.  No surgical intervention needed at present per notes. 2. ESRD - on HD MWF. NO HEPARIN due to subdural bleed. HD 10/5, next HD 10/7 3. HTN/ vol - cont meds. euvolemic on exam. BP's ok. 8kg under dry wt, will need new EDW at d/c 4. Seizures - on 2 AED's and gets clonazepam & extra Vimpat after each HD as well 5. DM on insulin 6. Mult myeloma - recent stem-cell transplant plan was aborted due to SDH and bacteremia/ infection at [facility]  7. Anemia ckd - Hb 10.9, cont to follow 8. Proteus bacteremia: on cipro, has nephrolithiasis that needs to be addressed in f/u with urology 9. Dispo: CIR  Subjective:    Had first day of CIR today- did quite well.  Tired and on HD today.      Objective:   BP (!) 155/90   Pulse 71   Temp 98.8 F (37.1 C) (Oral)   Resp 17   Ht 6' (1.829 m)   Wt 85.6 kg   SpO2 98%   BMI 25.59 kg/m   Physical Exam: Gen: NAD CVS:RRR Resp:clear anteriorly Abd: obese, NABS Ext: trace LE Edema NEURO: R facial droop ACCESS: R FA AVF  Labs: BMET Recent Labs  Lab 06/09/19 1047 06/09/19 1100 06/10/19 0810 06/11/19 0928 06/12/19 0441 06/13/19 0346 06/14/19 0247 06/15/19 1357  NA 139 138 138 138 138 139 136 137  K 4.7 4.8 4.3 4.0 4.3 4.9 4.2 4.8  CL 101 103 102 99 100 100 98 97*  CO2 21*  --  24 28 27 28 28 26   GLUCOSE 119* 118* 110* 84 83 139* 107* 145*  BUN 32* 33* 32* 20 33* 48* 23* 44*  CREATININE 13.08* 12.90* 12.44* 8.56* 10.13* 12.80* 7.50* 11.18*  CALCIUM 8.9  --  8.6* 8.3* 8.3* 8.3* 8.1* 8.7*  PHOS  --   --  2.3* 2.7 2.9 3.0 2.6 2.6   CBC Recent Labs  Lab 06/09/19 1047  06/11/19 0928 06/12/19 0441 06/13/19 0346 06/14/19 0247 06/15/19 1357  WBC  8.9   < > 5.6 6.1 5.8  --  7.0  NEUTROABS 6.4  --  3.0  --   --   --   --   HGB 9.5*   < > 9.0* 9.4* 9.0* 8.9* 9.0*  HCT 26.0*   < > RESULTS UNAVAILABLE DUE TO INTERFERING SUBSTANCE RESULTS UNAVAILABLE DUE TO INTERFERING SUBSTANCE RESULTS UNAVAILABLE DUE TO INTERFERING SUBSTANCE RESULTS UNAVAILABLE DUE TO INTERFERING SUBSTANCE RESULTS UNAVAILABLE DUE TO INTERFERING SUBSTANCE  MCV 94.2   < > RESULTS UNAVAILABLE DUE TO INTERFERING SUBSTANCE RESULTS UNAVAILABLE DUE TO INTERFERING SUBSTANCE RESULTS UNAVAILABLE DUE TO INTERFERING SUBSTANCE  --  RESULTS UNAVAILABLE DUE TO INTERFERING SUBSTANCE  PLT 282   < > 289 297 290  --  288   < > = values in this interval not displayed.    @IMGRELPRIORS @ Medications:    . acyclovir  400 mg Oral QHS  . amLODipine  5 mg Oral Daily  . calcium acetate  2,001 mg Oral TID WC  . calcium carbonate  2 tablet Oral QHS  . Chlorhexidine Gluconate Cloth  6 each Topical Q0600  . cinacalcet  60 mg Oral QPM  . ciprofloxacin  500 mg Oral QPM  . clonazePAM  0.5 mg Oral Q M,W,F-HD  . fluticasone furoate-vilanterol  1 puff Inhalation Daily  . insulin aspart  0-5 Units Subcutaneous QHS  . insulin aspart  0-9 Units Subcutaneous TID WC  . lacosamide  150 mg Oral BID  . lacosamide  150 mg Oral Q M,W,F-2000  . lamoTRIgine  150 mg Oral BID  . metoprolol tartrate  12.5 mg Oral BID  . multivitamin  1 tablet Oral QHS  . pantoprazole  40 mg Oral Daily     Madelon Lips, MD 06/15/2019, 5:01 PM

## 2019-06-15 NOTE — Progress Notes (Signed)
Initial Nutrition Assessment  DOCUMENTATION CODES:   Not applicable  INTERVENTION:   - d/c Pro-stat due to pt refusal  - Snacks TID between meals  - Nepro Shake po TID PRN, each supplement provides 425 kcal and 19 grams protein  - Continue rena-vit daily  - Encourage adequate PO intake  - Took pt's food preferences  NUTRITION DIAGNOSIS:   Increased nutrient needs related to chronic illness (ESRD on HD) as evidenced by estimated needs.  GOAL:   Patient will meet greater than or equal to 90% of their needs, Weight gain  MONITOR:   PO intake, Supplement acceptance, Labs, Weight trends, I & O's  REASON FOR ASSESSMENT:   Malnutrition Screening Tool    ASSESSMENT:   53 year old male with PMH of T2DM, atrial fibrillation, ESRD on HD, CVA 2017, SS thalassemia, DVT/PE, GIB, seizure disorder, MM with recent admission to [facility] 05/26/19  for stem cell transplant but procedure held due to findings of proteus mirabilis bacteremia, aspiration PNA, acute on chronic SDH. Pt was discharged to home on 06/07/19 on Cipro but has had progressive weakness with inability to tolerate POs. Pt developed slurred speech with increase in right-sided weakness, worsening of right facial weakness with slurred speech, inability to walk and missed dialysis day. CT head done revealing 1.5 cm acute on chronic SDH with 4 mm midline shift and chronic left/right frontal, right temporal, occipital and bilateral cerebellar infracts. MRI brain showed right chronic SDH with acute features, 14 mm thickness in parietal lobe and 9 mm thickness in frontal lobe with 5 mm midline shift, negative for acute infarct and generalized atrophy. Pt admitted to CIR on 10/6.   Plan for HD later today.  Spoke with pt at bedside. Pt reports that his appetite has improved recently and that he is eating well. Pt states that occasionally he does not like the food that he receives on his meal trays. Pt states that he does not like oral  nutrition supplements.  Pt reports that his UBW is 220-225 lbs. Pt endorses losing weight "when I started on dialysis" but unable to provide specific timeframe.  Reviewed weight history in chart. Pt with a 5.6 kg weight loss since 04/12/19. This is a 6.0% weight loss in 2 months which is significant for timeframe.  RD discussed the importance of adequate PO intake with a focus on protein-rich foods to aid in maintaining lean muscle mass. Pt expresses understanding.  Meal Completion: 75% x 1 recorded meal  Medications reviewed and include: Phoslo TID with meals, calcium carbonate, Sensipar, Pro-stat 30 ml TID, SSI, rena-vit, Protonix, Nepro TID PRN  Labs reviewed. CBG's: 104-154 x 24 hours  NUTRITION - FOCUSED PHYSICAL EXAM:    Most Recent Value  Orbital Region  No depletion  Upper Arm Region  No depletion  Thoracic and Lumbar Region  No depletion  Buccal Region  No depletion  Temple Region  No depletion  Clavicle Bone Region  No depletion  Clavicle and Acromion Bone Region  Mild depletion  Scapular Bone Region  No depletion  Dorsal Hand  No depletion  Patellar Region  No depletion  Anterior Thigh Region  No depletion  Posterior Calf Region  No depletion  Edema (RD Assessment)  None  Hair  Reviewed  Eyes  Reviewed  Mouth  Reviewed  Skin  Reviewed  Nails  Reviewed       Diet Order:   Diet Order            Diet renal/carb modified  with fluid restriction Diet-HS Snack? Nothing; Fluid restriction: 1200 mL Fluid; Room service appropriate? Yes; Fluid consistency: Thin  Diet effective now              EDUCATION NEEDS:   Education needs have been addressed  Skin:  Skin Assessment: Reviewed RN Assessment  Last BM:  06/12/19  Height:   Ht Readings from Last 1 Encounters:  06/14/19 6' (1.829 m)    Weight:   Wt Readings from Last 1 Encounters:  06/15/19 87.2 kg    Ideal Body Weight:  80.9 kg  BMI:  Body mass index is 26.07 kg/m.  Estimated Nutritional Needs:    Kcal:  2000-2200  Protein:  105-125 grams  Fluid:  1000 ml + UOP    Gaynell Face, MS, RD, LDN Inpatient Clinical Dietitian Pager: [phone] Weekend/After Hours: [phone]

## 2019-06-15 NOTE — Patient Care Conference (Signed)
Inpatient RehabilitationTeam Conference and Plan of Care Update Date: 06/15/2019   Time: 11:30 AM    Patient Name: Isaiah [name] [facility]      Medical Record Number: [id number]  Date of Birth: 17-Mar-1966 Sex: Male         Room/Bed: 4M04C/4M04C-01 Payor Info: Payor: [employer] / Plan: [employer] / Product Type: *No Product type* /    Admit Date/Time:  06/14/2019  4:57 PM  Primary Diagnosis:  <principal problem not specified>  Patient Active Problem List   Diagnosis Date Noted  . Right sided weakness   . SDH (subdural hematoma) ([place])   . Acute on chronic intracranial subdural hematoma ([facility]) 06/09/2019  . Multiple myeloma not having achieved remission ([place]) 06/09/2019  . ESRD on hemodialysis ([place]) 06/09/2019  . Essential hypertension 06/09/2019  . DM type 2 (diabetes mellitus, type 2) ([place]) 06/09/2019  . Seizure disorder ([place]) 06/09/2019  . Aspiration pneumonia ([place]) 06/09/2019  . History of bacteremia 06/09/2019  . Atrial fibrillation, chronic ([place]) 06/09/2019  . Multiple myeloma ([place]) 05/17/2018  . Goals of care, counseling/discussion 05/17/2018  . Partial idiopathic epilepsy with seizures of localized onset, intractable, without status epilepticus ([place]) 11/02/2017  . Iron deficiency anemia, unspecified 01/22/2017  . Hemiparesis affecting right side as late effect of cerebrovascular accident ([place]) 02/28/2016  . Aphasia complicating stroke 24/26/8341  . Seizures ([place]) 01/08/2016  . History of ischemic left MCA stroke 01/08/2016  . Right sided weakness 01/08/2016  . Atrial fibrillation ([place]) 01/08/2016  . Leukocytosis 01/08/2016  . Benign essential HTN 01/08/2016  . Anemia of chronic disease 01/08/2016  . Controlled type 2 diabetes mellitus with diabetic nephropathy, without long-term current use of insulin ([place]) 01/08/2016  . History of DVT (deep vein thrombosis) 01/08/2016  . ESRD (end stage renal disease) on dialysis [facility])     Expected Discharge Date: Expected Discharge  Date: (TBD)  Team Members Present: Physician leading conference: Dr. Courtney Heys Social Worker Present: Ovidio Kin, LCSW Nurse Present: Rayetta Pigg, RN PT Present: Michaelene Song, PT OT Present: Clyda Greener, OT SLP Present: Charolett Bumpers, SLP PPS Coordinator present : Gunnar Fusi, SLP     Current Status/Progress Goal Weekly Team Focus  Bowel/Bladder   anuric; continent of bowel LBM: 10/04  gain regular bowel pattern  assist with tolieting needs prn   Swallow/Nutrition/ Hydration             ADL's   min A overall with use of RW  S overall  strengthening, endurance, self care retraining, balance, pt/family edu, NMR   Mobility   eval pending  eval pending  eval pending   Communication   Eval Pending  Eval Pending      Safety/Cognition/ Behavioral Observations  Eval Pending  Eval Pending      Pain   no c/o pain  remain pain free  assess pain level QS and prn   Skin   skin intact  maintain skin intergrity  assess skin QS and prn      *See Care Plan and progress notes for long and short-term goals.     Barriers to Discharge  Current Status/Progress Possible Resolutions Date Resolved   Nursing                  PT                    OT  SLP                SW                Discharge Planning/Teaching Needs:    HOme with wife and son who is home attending college on-line.      Team Discussion:  New eval being evaluated in all areas. Currently min assist level-goals per OT supervision, verify with PT once eval completed.  Revisions to Treatment Plan:  New eval TBD    Medical Summary Current Status: Previous patient with history of chronic right hemiparesis and spasticity.  He is at baseline with this. Weekly Focus/Goal: Initiate rehab program  Barriers to Discharge: Medical stability;Other (comments)  Barriers to Discharge Comments: Chronic right hemiparesis, now with reduced balance from subdural hematoma Possible Resolutions to  Barriers: Continue rehab program   Continued Need for Acute Rehabilitation Level of Care: The patient requires daily medical management by a physician with specialized training in physical medicine and rehabilitation for the following reasons: Direction of a multidisciplinary physical rehabilitation program to maximize functional independence : Yes Medical management of patient stability for increased activity during participation in an intensive rehabilitation regime.: Yes Analysis of laboratory values and/or radiology reports with any subsequent need for medication adjustment and/or medical intervention. : Yes   I attest that I was present, lead the team conference, and concur with the assessment and plan of the team. Teleconference held due to COVID 19   Kelisha Dall, [place] 06/15/2019, 1:52 PM

## 2019-06-15 NOTE — Evaluation (Signed)
Occupational Therapy Assessment and Plan  Patient Details  Name: Isaiah [name]: [id number] Date of Birth: 04/20/1966  OT Diagnosis: abnormal posture, acute pain, cognitive deficits, muscle weakness (generalized) and hemiparesis Rehab Potential: Rehab Potential (ACUTE ONLY): Good ELOS: 7-10 days   Today's Date: 06/15/2019 OT Individual Time: 0700-0800 OT Individual Time Calculation (min): 60 min     Problem List:  Patient Active Problem List   Diagnosis Date Noted  . Right sided weakness   . SDH (subdural hematoma) ([place])   . Acute on chronic intracranial subdural hematoma (HCC) 06/09/2019  . Multiple myeloma not having achieved remission ([place]) 06/09/2019  . ESRD on hemodialysis ([place]) 06/09/2019  . Essential hypertension 06/09/2019  . DM type 2 (diabetes mellitus, type 2) ([place]) 06/09/2019  . Seizure disorder ([place]) 06/09/2019  . Aspiration pneumonia ([place]) 06/09/2019  . History of bacteremia 06/09/2019  . Atrial fibrillation, chronic ([place]) 06/09/2019  . Multiple myeloma ([place]) 05/17/2018  . Goals of care, counseling/discussion 05/17/2018  . Partial idiopathic epilepsy with seizures of localized onset, intractable, without status epilepticus ([place]) 11/02/2017  . Iron deficiency anemia, unspecified 01/22/2017  . Hemiparesis affecting right side as late effect of cerebrovascular accident ([place]) 02/28/2016  . Aphasia complicating stroke 24/58/0998  . Seizures ([place]) 01/08/2016  . History of ischemic left MCA stroke 01/08/2016  . Right sided weakness 01/08/2016  . Atrial fibrillation ([place]) 01/08/2016  . Leukocytosis 01/08/2016  . Benign essential HTN 01/08/2016  . Anemia of chronic disease 01/08/2016  . Controlled type 2 diabetes mellitus with diabetic nephropathy, without long-term current use of insulin ([place]) 01/08/2016  . History of DVT (deep vein thrombosis) 01/08/2016  . ESRD (end stage renal disease) on dialysis [facility])     Past Medical History:  Past Medical History:   Diagnosis Date  . A-fib ([place])   . Anemia   . Asthma   . Diabetes mellitus without complication ([place])   . DM type 2 (diabetes mellitus, type 2) ([place]) 06/09/2019  . ESRD (end stage renal disease) on dialysis ([place])   . Essential hypertension 06/09/2019  . GIB (gastrointestinal bleeding)    Recurrent episodes- 09/2014, 09/2015 and 05/2016  . Gout   . History of recent blood transfusion 10/27/14   2 Units PRBC's  . Hyperkalemia 07/2011  . Hypertension   . Monoclonal gammopathy   . Multiple myeloma ([place])   . OSA on CPAP   . Pulmonary emboli ([place]) 07/2011   bilateral--treated with comadin X 6 months  . Pulmonary embolism ([place]) 07/2011; 09/27/2014   a. Bilat PE 07/2011 - unclear cause, tx with 6 months Coumadin.;   . Seizure disorder ([place]) 06/09/2019  . Seizures ([place])   . Sepsis ([place])   . Sickle cell-thalassemia disease ([place])    a. Sickle cell trait.  . Sleep apnea   . Stroke ([place])   . Stroke ([place]) 09/2015   R-MCA, L-MCA, PCA and bilateral cerebellar complicated by DVT/PE  . Subdural hematoma ([place]) 05/2019   Past Surgical History:  Past Surgical History:  Procedure Laterality Date  . AV FISTULA PLACEMENT Right 12/05/2015   Procedure: INSERTION OF ARTERIOVENOUS (AV) GORE-TEX GRAFT ARM;  Surgeon: Elam Dutch, MD;  Location: [facility];  Service: Vascular;  Laterality: Right;  . BONE MARROW BIOPSY    . CHOLECYSTECTOMY    . CHOLECYSTECTOMY  1990's?  . COLONOSCOPY WITH PROPOFOL N/A 01/22/2017   Procedure: COLONOSCOPY WITH PROPOFOL;  Surgeon: Wilford Corner, MD;  Location: [facility];  Service: Endoscopy;  Laterality: N/A;  .  EYE SURGERY Right   . INSERTION OF DIALYSIS CATHETER Right 10/16/2015   Procedure: INSERTION OF PALINDROME DIALYSIS CATHETER ;  Surgeon: Elam Dutch, MD;  Location: [facility];  Service: Vascular;  Laterality: Right;  . OTHER SURGICAL HISTORY     Retinal surgery  . PARS PLANA VITRECTOMY  02/17/2012   Procedure: PARS PLANA VITRECTOMY WITH 25 GAUGE;  Surgeon: Hayden Pedro, MD;  Location: [facility];  Service: Ophthalmology;  Laterality: Right;  . PERIPHERAL VASCULAR CATHETERIZATION N/A 03/20/2016   Procedure: A/V Shuntogram/Fistulagram;  Surgeon: Conrad Fidelis, MD;  Location: [facility];  Service: Cardiovascular;  Laterality: N/A;  . TEE WITHOUT CARDIOVERSION N/A 12/02/2016   Procedure: TRANSESOPHAGEAL ECHOCARDIOGRAM (TEE);  Surgeon: Larey Dresser, MD;  Location: [facility];  Service: Cardiovascular;  Laterality: N/A;    Assessment & Plan Clinical Impression: Patient is a 53 y.o. year old male with history of T2DM, A fib, ESRD-HD MWF, A fib, CVA 2017 with CIR stay, SS thalassemia, DVT/PE-negative coagulopathy with IVC filter since 2017, GIB, seizure disorder, MM with recent admission to [facility] 05/26/19  for stem cell transplant but procedure held due to findings of proteus mirabilis bacteremia, aspiration PNA, acute on chronic SDH, B/L renal stones also found; breakthrough seizures (which according to patient, were much worse than prior) as well as delirium with bouts of agitation.  He was discharged to home on 06/07/19 on cipro but has had progressive weakness with inability to tolerate po's. He developed slurred speech with increase in right sided weakness, worsening of right facial weakness with slurred speech, inability to walk and missed dialysis day PTA 06/09/19 for work up. CT head done revealing 1.5 cm acute on chronic SDH with 4 mm midline shift and chronic left/right frontal, right temporal, occipital and bilateral cerebellar infracts. MRI brain showed right chronic SDH with acute features, 14 mm thickness in parietal lobe and 9 mm thickness in frontal lobe with 5 mm midline shift, negative for acute infarct and generalized atrophy"  Dr. Ellene Route consulted and recommended conservative care.  Dr. Leonel Ramsay felt that there was low suspicion for neurological cause and "mild worsening of right sided weakness was in setting of physiological stress of N/V".   Blood cultures X 2 negative and he continues on cipro to complete antibiotic regimen for treatment of  recent bacteremia?  Follow up CT head 10/4 showed stable right SDH with stable mass effect/3-4 mm midline shift and chronic L-MCA, Left cerebellar territory and right PCA infarcts.  Mentation improving and therapy initiated showing generalized weakness with poor activity tolerance and cognitive deficits. CIR recommended due to functional decline. .  Patient transferred to CIR on 06/14/2019 .    Patient currently requires min with basic self-care skills secondary to muscle weakness, decreased cardiorespiratoy endurance, decreased awareness, decreased problem solving, decreased safety awareness and decreased memory and decreased sitting balance, decreased standing balance and decreased balance strategies.  Prior to hospitalization, patient could complete ADLs and IADLs with independent .  Patient will benefit from skilled intervention to decrease level of assist with basic self-care skills prior to discharge home with care partner.  Anticipate patient will require 24 hour supervision and follow up outpatient.  OT - End of Session Activity Tolerance: Decreased this session Endurance Deficit: Yes Endurance Deficit Description: Required multiple rest breaks and reported fatigue throughout session OT Assessment Rehab Potential (ACUTE ONLY): Good OT Patient demonstrates impairments in the following area(s): Balance;Cognition;Endurance;Motor;Pain;Safety OT Basic ADL's Functional Problem(s): Grooming;Bathing;Dressing;Toileting OT Transfers Functional Problem(s):  Toilet;Tub/Shower OT Additional Impairment(s): None OT Plan OT Intensity: Minimum of 1-2 x/day, 45 to 90 minutes OT Frequency: 5 out of 7 days OT Duration/Estimated Length of Stay: 7-10 days OT Treatment/Interventions: Balance/vestibular training;DME/adaptive equipment instruction;Patient/family education;Therapeutic Activities;Cognitive  remediation/compensation;Psychosocial support;Therapeutic Exercise;Community reintegration;Functional mobility training;UE/LE Strength taining/ROM;Discharge planning;Neuromuscular re-education;UE/LE Coordination activities;Disease mangement/prevention OT Self Feeding Anticipated Outcome(s): n/a OT Basic Self-Care Anticipated Outcome(s): Supervision OT Toileting Anticipated Outcome(s): Supervision OT Bathroom Transfers Anticipated Outcome(s): Supervision OT Recommendation Recommendations for Other Services: Therapeutic Recreation consult;Neuropsych consult Therapeutic Recreation Interventions: Outing/community reintergration Patient destination: Home Follow Up Recommendations: Home health OT;24 hour supervision/assistance Equipment Recommended: To be determined   Skilled Therapeutic Intervention Upon entering the room, pt supine in bed with no c/o pain and agreeable to OT intervention. OT educated pt on OT purpose, POC, and goals with pt verbalizing understanding. OT also calling and speaking with wife this session to confirm equipment at home and to discuss 24/7 supervision recommendation for discharge. She verbalized understanding and that his mother would be staying for a month after discharge. Supine >sit with min A to EOB. Pt standing and ambulating 10' with RW into bathroom with min A for toileting. Min A balance while pt performed clothing management. Pt able to have BM and required assistance with hygiene for thoroughness. Pt ambulating to sink for bathing and dressing tasks with sit <>stand. Self care tasks at min A overall. Pt needing min - mod cuing for safety awareness and confusion over home set up. Pt seated in recliner chair with call bell and all needed items within reach. Chair alarm activated for safety.  OT Evaluation Precautions/Restrictions  Precautions Precautions: Fall Vital Signs Therapy Vitals Temp: 98.9 F (37.2 C) Pulse Rate: 91 Resp: 14 BP: 105/63 Patient  Position (if appropriate): Lying Oxygen Therapy SpO2: 99 % O2 Device: Room Air Pain Pain Assessment Pain Scale: 0-10 Pain Score: 0-No pain Home Living/Prior Functioning Home Living Family/patient expects to be discharged to:: Private residence Living Arrangements: Spouse/significant other Available Help at Discharge: Family, Available 24 hours/day Type of Home: House Home Access: Stairs to enter Technical brewer of Steps: 2 Entrance Stairs-Rails: Right Home Layout: Two level, 1/2 bath on main level, Bed/bath upstairs Alternate Level Stairs-Number of Steps: flight Bathroom Shower/Tub: Gaffer, Charity fundraiser: Associate Professor Accessibility: Yes  Lives With: Spouse, Son Prior Function Level of Independence: Independent with basic ADLs, Independent with homemaking with ambulation  Able to Take Stairs?: Yes Comments: did not use RW at all times per wife, Independent prior to recent [facility] admit Vision Baseline Vision/History: Wears glasses;Cataracts Wears Glasses: At all times Cognition Overall Cognitive Status: Within Functional Limits for tasks assessed Arousal/Alertness: Awake/alert Orientation Level: Person;Place;Situation Person: Oriented Situation: Oriented Year: 2020 Month: October Day of Week: Correct Memory: Impaired Memory Impairment: Decreased recall of new information Immediate Memory Recall: Sock;Bed;Blue Memory Recall Sock: Not able to recall Memory Recall Blue: Not able to recall Memory Recall Bed: Not able to recall Attention: Focused;Sustained Focused Attention: Impaired Sustained Attention: Impaired Awareness: Appears intact Problem Solving: Impaired Safety/Judgment: Impaired Comments: mild confusion with higher level tasks Sensation Sensation Light Touch: Appears Intact Proprioception: Appears Intact Coordination Gross Motor Movements are Fluid and Coordinated: No Fine Motor Movements are Fluid and Coordinated: No Motor   Motor Motor: Abnormal postural alignment and control;Hemiplegia Mobility  Bed Mobility Bed Mobility: Rolling Right;Rolling Left;Supine to Sit;Sit to Supine Rolling Right: Supervision/verbal cueing Rolling Left: Supervision/Verbal cueing Supine to Sit: Minimal Assistance - Patient > 75% Sit to Supine: Minimal Assistance - Patient >  75% Transfers Sit to Stand: Minimal Assistance - Patient > 75% Stand to Sit: Minimal Assistance - Patient > 75%  Trunk/Postural Assessment  Cervical Assessment Cervical Assessment: Within Functional Limits Thoracic Assessment Thoracic Assessment: Exceptions to WFL(kyphotic) Lumbar Assessment Lumbar Assessment: Within Functional Limits Postural Control Postural Control: Deficits on evaluation  Balance Balance Balance Assessed: Yes Standardized Balance Assessment Standardized Balance Assessment: Timed Up and Go Test Timed Up and Go Test TUG: Normal TUG Normal TUG (seconds): 27 Static Sitting Balance Static Sitting - Balance Support: Bilateral upper extremity supported Static Sitting - Level of Assistance: 5: Stand by assistance Static Sitting - Comment/# of Minutes: Mild posterior lean Dynamic Sitting Balance Dynamic Sitting - Balance Support: Bilateral upper extremity supported Dynamic Sitting - Level of Assistance: 4: Min Insurance risk surveyor Standing - Balance Support: No upper extremity supported Static Standing - Level of Assistance: 4: Min assist Dynamic Standing Balance Dynamic Standing - Balance Support: No upper extremity supported Dynamic Standing - Level of Assistance: 3: Mod assist Extremity/Trunk Assessment RUE Assessment RUE Assessment: Exceptions to [facility] Passive Range of Motion (PROM) Comments: WFLs Active Range of Motion (AROM) Comments: WFLs General Strength Comments: 3+/5 throughout LUE Assessment LUE Assessment: Within Functional Limits     Refer to Care Plan for Long Term Goals  Recommendations for  other services: Neuropsych and Therapeutic Recreation  Outing/community reintegration   Discharge Criteria: Patient will be discharged from OT if patient refuses treatment 3 consecutive times without medical reason, if treatment goals not met, if there is a change in medical status, if patient makes no progress towards goals or if patient is discharged from hospital.  The above assessment, treatment plan, treatment alternatives and goals were discussed and mutually agreed upon: by patient  Gypsy Decant 06/15/2019, 8:04 AM

## 2019-06-15 NOTE — Progress Notes (Signed)
Inpatient Rehabilitation  Patient information reviewed and entered into eRehab system by Orbin Mayeux M. Jansen Goodpasture, M.A., CCC/SLP, PPS Coordinator.  Information including medical coding, functional ability and quality indicators will be reviewed and updated through discharge.    

## 2019-06-15 NOTE — Progress Notes (Signed)
Patient left unit- transported to dialysis.  Brita Romp, RN

## 2019-06-15 NOTE — Care Management Note (Signed)
[facility] Individual Statement of Services  Patient Name:  Isaiah [name] [facility]  Date:  06/15/2019  Welcome to the [facility].  Our goal is to provide you with an individualized program based on your diagnosis and situation, designed to meet your specific needs.  With this comprehensive rehabilitation program, you will be expected to participate in at least 3 hours of rehabilitation therapies Monday-Friday, with modified therapy programming on the weekends.  Your rehabilitation program will include the following services:  Physical Therapy (PT), Occupational Therapy (OT), Speech Therapy (ST), 24 hour per day rehabilitation nursing, Neuropsychology, Case Management (Social Worker), Rehabilitation Medicine, Nutrition Services and Pharmacy Services  Weekly team conferences will be held on Wednesday to discuss your progress.  Your Social Worker will talk with you frequently to get your input and to update you on team discussions.  Team conferences with you and your family in attendance may also be held.  Expected length of stay: 8-10 days  Overall anticipated outcome: supervision with cueing  Depending on your progress and recovery, your program may change. Your Social Worker will coordinate services and will keep you informed of any changes. Your Social Worker's name and contact numbers are listed  below.  The following services may also be recommended but are not provided by the [facility]:    [facility] will be made to provide these services after discharge if needed.  Arrangements include referral to agencies that provide these services.  Your insurance has been verified to be:  Medicare & UMR Your primary doctor is:  Isaiah [name]  Pertinent information will be shared with your doctor and your insurance company.  Social Worker:  Ovidio Kin, [place] or ([phone]  Information discussed with and copy given to patient by: Elease Hashimoto, 06/15/2019, 3:24 PM

## 2019-06-15 NOTE — Evaluation (Signed)
Physical Therapy Assessment and Plan  Patient Details  Name: Isaiah [name] [name] MRN: [id number] Date of Birth: 1966/03/25  PT Diagnosis: Abnormal posture, Abnormality of gait, Coordination disorder, Difficulty walking, Hemiparesis dominant, Impaired cognition and Muscle weakness Rehab Potential: Good ELOS: 10 days   Today's Date: 06/15/2019 PT Individual Time:  - 1000-1100     Problem List:  Patient Active Problem List   Diagnosis Date Noted  . Right sided weakness   . SDH (subdural hematoma) ([place])   . Acute on chronic intracranial subdural hematoma (HCC) 06/09/2019  . Multiple myeloma not having achieved remission ([place]) 06/09/2019  . ESRD on hemodialysis ([place]) 06/09/2019  . Essential hypertension 06/09/2019  . DM type 2 (diabetes mellitus, type 2) ([place]) 06/09/2019  . Seizure disorder ([place]) 06/09/2019  . Aspiration pneumonia ([place]) 06/09/2019  . History of bacteremia 06/09/2019  . Atrial fibrillation, chronic ([place]) 06/09/2019  . Multiple myeloma ([place]) 05/17/2018  . Goals of care, counseling/discussion 05/17/2018  . Partial idiopathic epilepsy with seizures of localized onset, intractable, without status epilepticus ([place]) 11/02/2017  . Iron deficiency anemia, unspecified 01/22/2017  . Hemiparesis affecting right side as late effect of cerebrovascular accident ([place]) 02/28/2016  . Aphasia complicating stroke 40/98/1191  . Seizures ([place]) 01/08/2016  . History of ischemic left MCA stroke 01/08/2016  . Right sided weakness 01/08/2016  . Atrial fibrillation ([place]) 01/08/2016  . Leukocytosis 01/08/2016  . Benign essential HTN 01/08/2016  . Anemia of chronic disease 01/08/2016  . Controlled type 2 diabetes mellitus with diabetic nephropathy, without long-term current use of insulin ([place]) 01/08/2016  . History of DVT (deep vein thrombosis) 01/08/2016  . ESRD (end stage renal disease) on dialysis [name])     Past Medical History:  Past Medical History:  Diagnosis Date  . A-fib ([place])   .  Anemia   . Asthma   . Diabetes mellitus without complication ([place])   . DM type 2 (diabetes mellitus, type 2) ([place]) 06/09/2019  . ESRD (end stage renal disease) on dialysis ([place])   . Essential hypertension 06/09/2019  . GIB (gastrointestinal bleeding)    Recurrent episodes- 09/2014, 09/2015 and 05/2016  . Gout   . History of recent blood transfusion 10/27/14   2 Units PRBC's  . Hyperkalemia 07/2011  . Hypertension   . Monoclonal gammopathy   . Multiple myeloma ([place])   . OSA on CPAP   . Pulmonary emboli ([place]) 07/2011   bilateral--treated with comadin X 6 months  . Pulmonary embolism ([place]) 07/2011; 09/27/2014   a. Bilat PE 07/2011 - unclear cause, tx with 6 months Coumadin.;   . Seizure disorder ([place]) 06/09/2019  . Seizures ([place])   . Sepsis ([place])   . Sickle cell-thalassemia disease ([place])    a. Sickle cell trait.  . Sleep apnea   . Stroke ([place])   . Stroke ([place]) 09/2015   R-MCA, L-MCA, PCA and bilateral cerebellar complicated by DVT/PE  . Subdural hematoma ([place]) 05/2019   Past Surgical History:  Past Surgical History:  Procedure Laterality Date  . AV FISTULA PLACEMENT Right 12/05/2015   Procedure: INSERTION OF ARTERIOVENOUS (AV) GORE-TEX GRAFT ARM;  Surgeon: Elam Dutch, MD;  Location: [place];  Service: Vascular;  Laterality: Right;  . BONE MARROW BIOPSY    . CHOLECYSTECTOMY    . CHOLECYSTECTOMY  1990's?  . COLONOSCOPY WITH PROPOFOL N/A 01/22/2017   Procedure: COLONOSCOPY WITH PROPOFOL;  Surgeon: Wilford Corner, MD;  Location: [facility];  Service: Endoscopy;  Laterality: N/A;  . EYE SURGERY Right   .  INSERTION OF DIALYSIS CATHETER Right 10/16/2015   Procedure: INSERTION OF PALINDROME DIALYSIS CATHETER ;  Surgeon: Elam Dutch, MD;  Location: [place];  Service: Vascular;  Laterality: Right;  . OTHER SURGICAL HISTORY     Retinal surgery  . PARS PLANA VITRECTOMY  02/17/2012   Procedure: PARS PLANA VITRECTOMY WITH 25 GAUGE;  Surgeon: Hayden Pedro, MD;  Location: [place];   Service: Ophthalmology;  Laterality: Right;  . PERIPHERAL VASCULAR CATHETERIZATION N/A 03/20/2016   Procedure: A/V Shuntogram/Fistulagram;  Surgeon: Conrad [place], MD;  Location: [place] CV LAB;  Service: Cardiovascular;  Laterality: N/A;  . TEE WITHOUT CARDIOVERSION N/A 12/02/2016   Procedure: TRANSESOPHAGEAL ECHOCARDIOGRAM (TEE);  Surgeon: Larey Dresser, MD;  Location: [place];  Service: Cardiovascular;  Laterality: N/A;    Assessment & Plan Clinical Impression: Patient is a 53 y.o. year old male with history of T2DM, A fib, ESRD-HD MWF, A fib, CVA 2017 with CIR stay, SS thalassemia, DVT/PE-negative coagulopathy with IVC filter since 2017, GIB, seizure disorder, MM with recent admission to [facility] 05/26/19  for stem cell transplant but procedure held due to findings of proteus mirabilis bacteremia, aspiration PNA, acute on chronic SDH, B/L renal stones also found; breakthrough seizures (which according to patient, were much worse than prior) as well as delirium with bouts of agitation. He was discharged to home on 06/07/19 on cipro but has had progressive weakness with inability to tolerate po's. He developed slurred speech with increase in right sided weakness, worsening of right facial weakness with slurred speech, inability to walk and missed dialysis day PTA 06/09/19 for work up. CT head done revealing 1.5 cm acute on chronic SDH with 4 mm midline shift and chronic left/right frontal, right temporal, occipital and bilateral cerebellar infracts. MRI brain showed right chronic SDH with acute features, 14 mm thickness in parietal lobe and 9 mm thickness in frontal lobe with 5 mm midline shift, negative for acute infarct and generalized atrophy"  Dr. Ellene Route consulted and recommended conservative care.  Patient currently requires mod with mobility secondary to muscle weakness, impaired timing and sequencing, decreased coordination and decreased motor planning and decreased sitting balance, decreased  standing balance, decreased postural control, hemiplegia and decreased balance strategies.  Prior to hospitalization, patient was independent  with mobility and lived with Spouse, Son in a House home.  Home access is 2(17 steps inside unable to live on first floor)Stairs to enter.  Patient will benefit from skilled PT intervention to maximize safe functional mobility, minimize fall risk and decrease caregiver burden for planned discharge home with 24 hr assistance for first month.  Anticipate patient will benefit from follow up OP at discharge.  PT - End of Session Activity Tolerance: Tolerates 30+ min activity with multiple rests Endurance Deficit: Yes Endurance Deficit Description: Required multiple rest breaks and reported fatigue throughout session PT Assessment Rehab Potential (ACUTE/IP ONLY): Good PT Patient demonstrates impairments in the following area(s): Balance;Endurance;Motor;Safety PT Transfers Functional Problem(s): Bed Mobility;Bed to Chair;Car;Furniture PT Locomotion Functional Problem(s): Ambulation;Stairs PT Plan PT Intensity: Minimum of 1-2 x/day ,45 to 90 minutes PT Frequency: 5 out of 7 days PT Duration Estimated Length of Stay: 10 days PT Treatment/Interventions: Ambulation/gait training;Discharge planning;Functional mobility training;Therapeutic Activities;Balance/vestibular training;Disease management/prevention;Neuromuscular re-education;Therapeutic Exercise;Wheelchair propulsion/positioning;Cognitive remediation/compensation;DME/adaptive equipment instruction;Pain management;UE/LE Strength taining/ROM;Functional electrical stimulation;Community reintegration;Patient/family education;Stair training;UE/LE Coordination activities PT Transfers Anticipated Outcome(s): Supervision PT Locomotion Anticipated Outcome(s): CGA PT Recommendation Follow Up Recommendations: Outpatient PT Patient destination: Home Equipment Recommended: To be determined Equipment Details: Has cane  and RW at home Skilled Therapeutic Intervention Evaluation completed (see details above and below) with education on PT POC and goals and individual treatment initiated with focus on functional mobility/transfers, ambulation, stair navigation, energy conservation techniques, improved tolerance to activity, LE strength, and balance/coordination/motor planning.   Received pt supine in bed, pt agreeable to PT. Pt denied any pain during today's session. Pt rolled L/R in bed with supervision but required min A to transition supine<>sitting EOB and relied on bedrails and momentum. Pt transferred sit<>stand without AD min A and was able to transfer bed<>WC using stand pivot technique min A. Pt required verbal cues for hand placement. Pt was instructed on seated exercises during rest breaks including 2x12 LAQ. Pt required cueing for muscle activation, eccentric control, and counting. Pt performed simulated car transfer without AD mod A due to unsafe entry into car. Pt navigated 8 steps with 2 rails mod A ascending with reciprocal pattern and descending with step to pattern. Pt required verbal cues for foot placement and slow pace. Pt ambulated 13f without AD mod A. Pt demonstrated R lateral lean, narrow BOS (especially when turning), R inattention, decreased weight shifting on RLE, decreased step length on LLE, anterior lean, and decreased trunk rotation and arm swing. Pt ambulated 16fover uneven surfaces without AD mod/max A due to poor RLE foot clearance and decreased awareness to slow down. Pt was able to pick up object from floor without AD min A. Pt performed WC mobility 5070fith supervision using bilateral UE's only but required mod verbal cues to avoid running into obstacles on R side. Pt performed TUG x 3 trials without AD at average speed of 27sec. Pt was educated on significance of test and result indicating fall risk. Concluded session with pt supine in bed, all needs within reach, bed alarm on, and  safety plan updated.    PT Evaluation Precautions/Restrictions Precautions Precautions: Fall Precaution Comments: R inattention Restrictions Weight Bearing Restrictions: No General   Vital Signs  Pain Pain Assessment Pain Scale: 0-10 Pain Score: 0-No pain Home Living/Prior Functioning Home Living Available Help at Discharge: Family;Available 24 hours/day Type of Home: House Home Access: Stairs to enter [employer] Steps: 2(17 steps inside unable to live on first floor) Entrance Stairs-Rails: None Home Layout: Two level;1/2 bath on main level;Bed/bath upstairs Alternate Level Stairs-Number of Steps: 17 steps Alternate Level Stairs-Rails: Can reach both;Right;Left Bathroom Shower/Tub: [employer] Bathroom Accessibility: Yes  Lives With: Spouse;Son Prior Function Level of Independence: Independent with basic ADLs;Independent with transfers;Independent with homemaking with ambulation;Independent with gait  Able to Take Stairs?: Yes Driving: No 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[employer]: Hobbies-yes (Comment)(music, tv) Comments: did not use RW at all times per wife, Independent prior to recent [facility] admit Vision/Perception  Cognition Overall Cognitive Status: Within Functional Limits for tasks assessed Arousal/Alertness: Awake/alert Orientation Level: Oriented X4 Attention: Focused;Sustained Focused Attention: Impaired Sustained Attention: Impaired Memory: Appears intact Awareness: Appears intact Problem Solving: Impaired Safety/Judgment: Appears intact Comments: mild confusion with higher level tasks Sensation Sensation Light Touch: Appears Intact Proprioception: Appears Intact Coordination Gross Motor Movements are Fluid and Coordinated: No Fine Motor Movements are Fluid and Coordinated: No Coordination and Movement Description: poor postural alignement and motor control Finger Nose Finger Test:  Dysmetria bilaterally Heel Shin Test: WNL Motor  Motor Motor: Abnormal postural alignment and control;Hemiplegia(Mild R hemiparesis) Motor - Skilled Clinical Observations: Coordination grossly decreased  Mobility Bed Mobility Bed Mobility: Rolling Right;Rolling Left;Supine to Sit;Sit to Supine Rolling  Right: Supervision/verbal cueing Rolling Left: Supervision/Verbal cueing Supine to Sit: Minimal Assistance - Patient > 75%(Relies on momentum and red rails to sit up) Sit to Supine: Minimal Assistance - Patient > 75% Transfers Transfers: Sit to Stand;Stand to Sit;Stand Pivot Transfers Sit to Stand: Minimal Assistance - Patient > 75% Stand to Sit: Minimal Assistance - Patient > 75% Stand Pivot Transfers: Minimal Assistance - Patient > 75% Stand Pivot Transfer Details: Verbal cues for sequencing;Verbal cues for technique;Verbal cues for precautions/safety Stand Pivot Transfer Details (indicate cue type and reason): verbal cues for hand placement, foot placement, and controlled decent Transfer (Assistive device): None Locomotion  Gait Ambulation: Yes Gait Assistance: Moderate Assistance - Patient 50-74% Gait Distance (Feet): 110 Feet Assistive device: None Gait Assistance Details: Verbal cues for sequencing;Verbal cues for technique;Verbal cues for precautions/safety;Verbal cues for gait pattern Gait Assistance Details: R inattention, narrow BOS, anterior trunk lean, dereased R stance time and weight shift, decreased L step length Gait Gait: Yes Gait Pattern: Impaired Gait Pattern: Decreased stance time - right;Decreased weight shift to right;Decreased step length - left;Decreased trunk rotation;Trunk flexed;Lateral trunk lean to right;Narrow base of support;Poor foot clearance - left Gait velocity: Decreased Stairs / Additional Locomotion Stairs: Yes Stairs Assistance: Moderate Assistance - Patient 50 - 74% Stair Management Technique: Two rails Number of Stairs: 8 Height of Stairs:  6 Ramp: Moderate Assistance - Patient 50 - 74%(no AD) Curb: Moderate Assistance - Patient 50 - 74%(no AD) Wheelchair Mobility Wheelchair Mobility: Yes Wheelchair Assistance: Chartered loss adjuster: Both upper extremities Wheelchair Parts Management: Needs assistance Distance: 39f  Trunk/Postural Assessment  Cervical Assessment Cervical Assessment: Within Functional Limits Thoracic Assessment Thoracic Assessment: Exceptions to WFL(mild kyphosis) Lumbar Assessment Lumbar Assessment: Within Functional Limits Postural Control Postural Control: Deficits on evaluation  Balance Balance Balance Assessed: Yes Standardized Balance Assessment Standardized Balance Assessment: Timed Up and Go Test Timed Up and Go Test TUG: Normal TUG Normal TUG (seconds): 27 Static Sitting Balance Static Sitting - Balance Support: Bilateral upper extremity supported Static Sitting - Level of Assistance: 5: Stand by assistance Static Sitting - Comment/# of Minutes: Mild posterior lean Dynamic Sitting Balance Dynamic Sitting - Balance Support: Bilateral upper extremity supported Dynamic Sitting - Level of Assistance: 4: Min aInsurance risk surveyorStanding - Balance Support: No upper extremity supported Static Standing - Level of Assistance: 4: Min assist Dynamic Standing Balance Dynamic Standing - Balance Support: No upper extremity supported Dynamic Standing - Level of Assistance: 3: Mod assist Extremity Assessment  RLE Assessment RLE Assessment: Exceptions to WW[facility] In [facility] PhiladeLPhiaGeneral Strength Comments: Grossly generalized to 4-/5, except PF at 3+/5 LLE Assessment LLE Assessment: Exceptions to WCap[facility] LLCGeneral Strength Comments: Grossly generalized to 4/5   Refer to Care Plan for Long Term Goals  Recommendations for other services: None   Discharge Criteria: Patient will be discharged from PT if patient refuses treatment 3 consecutive times without medical reason, if  treatment goals not met, if there is a change in medical status, if patient makes no progress towards goals or if patient is discharged from hospital.  The above assessment, treatment plan, treatment alternatives and goals were discussed and mutually agreed upon: by patient  AAlfonse AlpersPT, DPT   06/15/2019, 11:36 AM

## 2019-06-15 NOTE — Progress Notes (Signed)
Patient refused CPAP for the night  

## 2019-06-15 NOTE — Telephone Encounter (Signed)
Pt wife called and given this information   Please call his wife to thank her for the update. I have received his hospitalization records. Please give [place] a call when she has d/c information from inpatient rehab.

## 2019-06-16 ENCOUNTER — Inpatient Hospital Stay (HOSPITAL_COMMUNITY): Payer: Medicare Other | Admitting: Occupational Therapy

## 2019-06-16 ENCOUNTER — Inpatient Hospital Stay (HOSPITAL_COMMUNITY): Payer: Medicare Other | Admitting: Physical Therapy

## 2019-06-16 ENCOUNTER — Inpatient Hospital Stay (HOSPITAL_COMMUNITY): Payer: Medicare Other | Admitting: Speech Pathology

## 2019-06-16 LAB — GLUCOSE, CAPILLARY
Glucose-Capillary: 99 mg/dL (ref 70–99)
Glucose-Capillary: 137 mg/dL — ABNORMAL HIGH (ref 70–99)
Glucose-Capillary: 128 mg/dL — ABNORMAL HIGH (ref 70–99)
Glucose-Capillary: 121 mg/dL — ABNORMAL HIGH (ref 70–99)

## 2019-06-16 LAB — RENAL FUNCTION PANEL
Albumin: 3.5 g/dL (ref 3.5–5.0)
Anion gap: 11 (ref 5–15)
BUN: 26 mg/dL — ABNORMAL HIGH (ref 6–20)
CO2: 29 mmol/L (ref 22–32)
Calcium: 8.6 mg/dL — ABNORMAL LOW (ref 8.9–10.3)
Chloride: 97 mmol/L — ABNORMAL LOW (ref 98–111)
Creatinine, Ser: 8.74 mg/dL — ABNORMAL HIGH (ref 0.61–1.24)
GFR calc Af Amer: 7 mL/min — ABNORMAL LOW (ref >60)
GFR calc non Af Amer: 6 mL/min — ABNORMAL LOW (ref >60)
Glucose, Bld: 135 mg/dL — ABNORMAL HIGH (ref 70–99)
Phosphorus: 2.5 mg/dL (ref 2.5–4.6)
Potassium: 4.5 mmol/L (ref 3.5–5.1)
Sodium: 137 mmol/L (ref 135–145)

## 2019-06-16 LAB — CBC
HCT: 26.1 % — ABNORMAL LOW (ref 39.0–52.0)
Hemoglobin: 9.8 g/dL — ABNORMAL LOW (ref 13.0–17.0)
MCH: 34.6 pg — ABNORMAL HIGH (ref 26.0–34.0)
MCHC: 37.5 g/dL — ABNORMAL HIGH (ref 30.0–36.0)
MCV: 92.2 fL (ref 80.0–100.0)
Platelets: 291 10*3/uL (ref 150–400)
RBC: 2.83 MIL/uL — ABNORMAL LOW (ref 4.22–5.81)
RDW: 15.9 % — ABNORMAL HIGH (ref 11.5–15.5)
WBC: 6.3 10*3/uL (ref 4.0–10.5)
nRBC: 0 % (ref 0.0–0.2)

## 2019-06-16 MED ORDER — ALTEPLASE 2 MG IJ SOLR
2.0000 mg | Freq: Once | INTRAMUSCULAR | Status: DC | PRN
  Filled 2019-06-16: qty 2

## 2019-06-16 MED ORDER — CHLORHEXIDINE GLUCONATE CLOTH 2 % EX PADS: 6.0000 | MEDICATED_PAD | Freq: Every day | CUTANEOUS | Status: DC

## 2019-06-16 MED ORDER — HEPARIN SODIUM (PORCINE) 1000 UNIT/ML DIALYSIS
1000.0000 [IU] | INTRAMUSCULAR | Status: DC | PRN
  Filled 2019-06-16: qty 1

## 2019-06-16 MED ORDER — PENTAFLUOROPROP-TETRAFLUOROETH EX AERO: 1.0000 "application " | INHALATION_SPRAY | CUTANEOUS | Status: DC | PRN

## 2019-06-16 MED ORDER — SODIUM CHLORIDE 0.9 % IV SOLN: 100.0000 mL | INTRAVENOUS | Status: DC | PRN

## 2019-06-16 MED ORDER — LIDOCAINE-PRILOCAINE 2.5-2.5 % EX CREA
1.0000 "application " | TOPICAL_CREAM | CUTANEOUS | Status: DC | PRN
  Filled 2019-06-16: qty 5

## 2019-06-16 MED ORDER — AMLODIPINE BESYLATE 5 MG PO TABS
7.5000 mg | ORAL_TABLET | Freq: Every day | ORAL | Status: DC
  Administered 2019-06-16 – 2019-06-19 (×3): 7.5 mg via ORAL
  Filled 2019-06-16 (×3): qty 1

## 2019-06-16 MED ORDER — DARBEPOETIN ALFA 60 MCG/0.3ML IJ SOSY
60.0000 ug | PREFILLED_SYRINGE | INTRAMUSCULAR | Status: DC
  Administered 2019-06-17 – 2019-06-24 (×2): 60 ug via INTRAVENOUS
  Filled 2019-06-16 (×2): qty 0.3

## 2019-06-16 MED ORDER — LIDOCAINE HCL (PF) 1 % IJ SOLN: 5.0000 mL | INTRAMUSCULAR | Status: DC | PRN

## 2019-06-16 NOTE — Progress Notes (Signed)
Social Work Assessment and Plan   Patient Details  Name: Isaiah [name] MRN: [id number] Date of Birth: 1966-09-01  Today's Date: 06/16/2019  Problem List:  Patient Active Problem List   Diagnosis Date Noted  . Right sided weakness   . SDH (subdural hematoma) ([place])   . Acute on chronic intracranial subdural hematoma (HCC) 06/09/2019  . Multiple myeloma not having achieved remission ([place]) 06/09/2019  . ESRD on hemodialysis ([place]) 06/09/2019  . Essential hypertension 06/09/2019  . DM type 2 (diabetes mellitus, type 2) ([place]) 06/09/2019  . Seizure disorder ([place]) 06/09/2019  . Aspiration pneumonia ([place]) 06/09/2019  . History of bacteremia 06/09/2019  . Atrial fibrillation, chronic ([place]) 06/09/2019  . Multiple myeloma ([place]) 05/17/2018  . Goals of care, counseling/discussion 05/17/2018  . Partial idiopathic epilepsy with seizures of localized onset, intractable, without status epilepticus ([place]) 11/02/2017  . Iron deficiency anemia, unspecified 01/22/2017  . Hemiparesis affecting right side as late effect of cerebrovascular accident ([place]) 02/28/2016  . Aphasia complicating stroke 24/58/0998  . Seizures ([place]) 01/08/2016  . History of ischemic left MCA stroke 01/08/2016  . Right sided weakness 01/08/2016  . Atrial fibrillation ([place]) 01/08/2016  . Leukocytosis 01/08/2016  . Benign essential HTN 01/08/2016  . Anemia of chronic disease 01/08/2016  . Controlled type 2 diabetes mellitus with diabetic nephropathy, without long-term current use of insulin ([place]) 01/08/2016  . History of DVT (deep vein thrombosis) 01/08/2016  . ESRD (end stage renal disease) on dialysis [facility])    Past Medical History:  Past Medical History:  Diagnosis Date  . A-fib ([place])   . Anemia   . Asthma   . Diabetes mellitus without complication ([place])   . DM type 2 (diabetes mellitus, type 2) ([place]) 06/09/2019  . ESRD (end stage renal disease) on dialysis ([place])   . Essential hypertension 06/09/2019  . GIB  (gastrointestinal bleeding)    Recurrent episodes- 09/2014, 09/2015 and 05/2016  . Gout   . History of recent blood transfusion 10/27/14   2 Units PRBC's  . Hyperkalemia 07/2011  . Hypertension   . Monoclonal gammopathy   . Multiple myeloma ([place])   . OSA on CPAP   . Pulmonary emboli ([place]) 07/2011   bilateral--treated with comadin X 6 months  . Pulmonary embolism ([place]) 07/2011; 09/27/2014   a. Bilat PE 07/2011 - unclear cause, tx with 6 months Coumadin.;   . Seizure disorder ([place]) 06/09/2019  . Seizures ([place])   . Sepsis ([place])   . Sickle cell-thalassemia disease ([place])    a. Sickle cell trait.  . Sleep apnea   . Stroke ([place])   . Stroke ([place]) 09/2015   R-MCA, L-MCA, PCA and bilateral cerebellar complicated by DVT/PE  . Subdural hematoma ([place]) 05/2019   Past Surgical History:  Past Surgical History:  Procedure Laterality Date  . AV FISTULA PLACEMENT Right 12/05/2015   Procedure: INSERTION OF ARTERIOVENOUS (AV) GORE-TEX GRAFT ARM;  Surgeon: Elam Dutch, MD;  Location: [place];  Service: Vascular;  Laterality: Right;  . BONE MARROW BIOPSY    . CHOLECYSTECTOMY    . CHOLECYSTECTOMY  1990's?  . COLONOSCOPY WITH PROPOFOL N/A 01/22/2017   Procedure: COLONOSCOPY WITH PROPOFOL;  Surgeon: Wilford Corner, MD;  Location: [facility];  Service: Endoscopy;  Laterality: N/A;  . EYE SURGERY Right   . INSERTION OF DIALYSIS CATHETER Right 10/16/2015   Procedure: INSERTION OF PALINDROME DIALYSIS CATHETER ;  Surgeon: Elam Dutch, MD;  Location: [place];  Service: Vascular;  Laterality: Right;  .  OTHER SURGICAL HISTORY     Retinal surgery  . PARS PLANA VITRECTOMY  02/17/2012   Procedure: PARS PLANA VITRECTOMY WITH 25 GAUGE;  Surgeon: Hayden Pedro, MD;  Location: [place];  Service: Ophthalmology;  Laterality: Right;  . PERIPHERAL VASCULAR CATHETERIZATION N/A 03/20/2016   Procedure: A/V Shuntogram/Fistulagram;  Surgeon: Conrad Redwater, MD;  Location: [place] [facility];  Service: Cardiovascular;   Laterality: N/A;  . TEE WITHOUT CARDIOVERSION N/A 12/02/2016   Procedure: TRANSESOPHAGEAL ECHOCARDIOGRAM (TEE);  Surgeon: Larey Dresser, MD;  Location: [place];  Service: Cardiovascular;  Laterality: N/A;   Social History:  reports that he has never smoked. He has never used smokeless tobacco. He reports previous alcohol use. He reports that he does not use drugs.  Family / Support Systems Marital Status: Married Patient Roles: Spouse, Parent Spouse/Significant Other: [name] [phone] Children: 24 yo son at home taking college classes at [employer] Other Supports: Feliciana Rossetti [phone] Anticipated Caregiver: Wife and son Ability/Limitations of Caregiver: Wife works at [employer] and son at home in college on-line Caregiver Availability: 24/7 Family Dynamics: Close knit family who are willing to do what ever pt needs. They have provided assist prior with hisother health issues. He has good social supports also-per wife  Social History Preferred language: English Religion: Baptist Cultural Background: No issues Education: Data processing manager: Yes Write: Yes Employment Status: Disabled Public relations account executive Issues: No issues Guardian/Conservator: None-according to MD pt is not capable of making his own decisions while here will look toward his wife fo any decision while here   Abuse/Neglect Abuse/Neglect Assessment Can Be Completed: Yes Physical Abuse: Denies Verbal Abuse: Denies Sexual Abuse: Denies Exploitation of patient/patient's resources: Denies Self-Neglect: Denies  Emotional Status Pt's affect, behavior and adjustment status: Pt wants to get better, he was mod/i prior to admission and wants to get back to this. He is grateful for his son and wife and feels they will assist but does not want to burden them. He wants to care for himself prior to DC from here. Recent Psychosocial Issues: complex medical issues-was surpose to have stem cell transplant at [facility] when this  happened. Psychiatric History: No history-would benefit from seeing neruo-psych while here due to multiple medical issues and what he is dealing with. Will ask team when appropriate to be seen. Substance Abuse History: No issues  Patient / Family Perceptions, Expectations & Goals Pt/Family understanding of illness & functional limitations: Pt and wife can explain his condition and deficits. His wife talks with the MD and feels she has a good understanding of his treatment plan going forward. Both keep each other updated and wife is one who seeks information if she has a question that has not been addressed. 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."  [employer]: Other (Comment)(Followed at [facility] for multiple myeloma) Premorbid Home Care/DME Agencies: Other (Comment)(has had HH, OP and has DME from previous illnesses) Transportation available at discharge: Family SCAT takes pt to HD M, W,F Resource referrals recommended: Neuropsychology, Support group (specify)  Discharge Planning Living Arrangements: Spouse/significant other, Children Support Systems: Spouse/significant other, Children, Parent, Other relatives, Friends/neighbors, Church/faith community Type of Residence: Private residence Insurance Resources: [employer], Multimedia programmer (specify)([facility]) Financial Resources: SSD, Secondary school teacher  Screen Referred: No Living Expenses: Lives with family Money Management: Spouse Does the patient have any problems obtaining your medications?: No Home Management: Wife and son Patient/Family Preliminary Plans: Return home with wife and son who together can provide 24 hr care. Wife  works days at [employer] and son is taking on-line classes at [employer]. Pt's mom is involved also, if needed. Will await team evaluations and work on discharge plans.  Clinical Impression Pleasant gentleman who has been to CIR in 2017 after CVA. He is aware of the program and wife is also. Both are pleased he is here and ready to work and make progress. DO feel he would benefit from seeing neuro-psych while here due to multiple medical issues and young age. Will work on discharge needs.  Elease Hashimoto 06/16/2019, 8:47 AM

## 2019-06-16 NOTE — Progress Notes (Signed)
Physical Therapy Session Note  Patient Details  Name: Isaiah [name] MRN: [id number] Date of Birth: 1966/02/28  Today's Date: 06/16/2019 PT Individual Time: 1005-1049 PT Individual Time Calculation (min): 44 min   Short Term Goals: Week 1:  PT Short Term Goal 1 (Week 1): =LTG due to ELOS  Skilled Therapeutic Interventions/Progress Updates:    Pt received supine in bed and agreeable to therapy session. Supine>sit, HOB partially elevated, with supervision and increased time. Stand pivot EOB>w/c, no AD, with min assist for balance. Transported to/from gym in w/c for energy conservation. Therapist provided pt with w/c cushion for improved pressure relief and increased OOB activity tolerance. Ambulated ~124f, no AD, with min/mod assist for balance - pt demonstrates increased postural sway, decreased gait speed, narrow BOS, decreased B LE step length, and decreased B LE foot clearance. Performed repeated sit<>stands EOM<>no UE support 2 sets of 10 reps with min assist for balance and max cuing with manual facilitation for increased anterior trunk weight shift and decreased compensation via use of back of B LEs on mat. Ambulated ~245fx 2 to/from stairs, no AD, with min assist for balance continuing to demonstrate above gait impairments with cuing for improvements. Ascended/descended 8 steps using bilateral handrails with min assist for balance - ascended with reciprocal pattern and descended with step-to pattern. Pt then requesting to return to room as he was tired stating "my mother and wife will tell you...Marland KitchenMarland Kitchenm [age]ears old and I'm not going to do anything I don't want to." Therapist encouraged pt to continue participating in therapy session and pt agreeable to perform seated R UE NMR task. Performed sitting EOM R UE NMR task of placing yellow, red, green, and 1 blue clothespin on/off clothespin lines with pt requiring intermittent assist from L UE for placement of clothespin in R hand - when removing  clothespin demonstrated grasping it with entire hand as opposed to using a 3-jaw chuck technique due to weakness. Stand pivot EOM>w/c with min assist for balance. Transported back to room in w/c and pt requesting to return to bed despite encouragement to remain up in w/c - pt agreeable to transfer back into w/c for lunch meal and RN notified. Stand pivot w/c>EOB, no AD, with min assist for balance. Sit>supine with supervision. Pt left supine in bed with needs in reach and bed alarm on.  Therapy Documentation Precautions:  Precautions Precautions: Fall Precaution Comments: R inattention Restrictions Weight Bearing Restrictions: No  Pain:  No reports of pain during session.   Therapy/Group: Individual Therapy  CaTawana ScalePT, DPT 06/16/2019, 7:56 AM

## 2019-06-16 NOTE — Progress Notes (Signed)
Physical Therapy Session Note  Patient Details  Name: Isaiah [name] [facility] MRN: [id number] Date of Birth: 01/21/1966  Today's Date: 06/16/2019 PT Individual Time: 8938-1017 PT Individual Time Calculation (min): 42 min   Short Term Goals: Week 1:  PT Short Term Goal 1 (Week 1): =LTG due to ELOS  Skilled Therapeutic Interventions/Progress Updates:    Pt received sitting in recliner and agreeable to therapy session. Sit<>stands, no AD, with varying CGA to min assist for balance throughout session as pt demonstrates intermittent posterior lean. Ambulated ~123f, no AD, to therapy gym with min/mod assist for balance as pt continues to demonstrates increased postural sway, intermittent anterior trunk lean, narrow BOS, decreased BLE step length and foot clearance - cuing for improved gait mechanics. Pt reports R calf cramping after walk attempted seated calf stretch but no relief. Ambulated ~251fx2 to/from stairs again with min/mod assist for balance. Performed standing calf stretch on stairs 2x30 seconds with cuing for holding stretch longer and proper foot placement on step - B UE support on rails and CGA for steadying. Ascended/descended 4 steps using B Hrs with reciprocal pattern on ascent and step-to pattern on descent with min assist for balance. Participated in standing balance, R attention, and R UE NMR task of grasping and placing clothespins on basketball goal - started on firm surface progressed to narrow BOS with CGA for steadying - then progressed to standing on airex with mod/max assist for balance due to pt having posterior lean with inability to correct while performing dual task with clothespins and pt requesting to stop the task due to increased difficulty. Ambulated ~1508fack to room with primarily min assist and 2x mod assist due to anterior trunk lean with minor LOB - continues to demonstrate above impairments but improving. Pt's wife present upon arrival to room and therapist educated pt/wife  on importance of walking to/from therapy gym for increased ambulation every day - pt in agreement with his wife stating her goal is for him to return to baseline. Sit>supine with supervision. Pt left supine in bed with needs in reach and bed alarm on.  Therapy Documentation Precautions:  Precautions Precautions: Fall Precaution Comments: R inattention Restrictions Weight Bearing Restrictions: No  Pain: Denies pain during session.   Therapy/Group: Individual Therapy  CarTawana ScaleT, DPT 06/16/2019, 1:42 PM

## 2019-06-16 NOTE — Progress Notes (Signed)
[facility] Progress Note   Assessment/ Plan:   Outpt HD: [facility]  4h 400/800 92kg 2/2 bath  AVFHep 2600 (NO HEP here due to SDH) mircera 225 q2wks,  calcitriol 1.75 ug tiw venofer 50 /wk    Assessment/ Plan: 1. AMS/ acute on chronic SDH - per primary/ neuro/ NSurg.  No surgical intervention needed. 2. ESRD - on HD MWF. NO HEPARIN due to subdural bleed. HNext HD 10/9 3. HTN/ vol - cont meds. euvolemic on exam. BP's ok. 8kg under dry wt, will need new EDW at d/c 4. Seizures - on 2 AED's and gets clonazepam & extra Vimpat after each HD as well 5. DM on insulin 6. Mult myeloma - recent stem-cell transplant plan was aborted due to SDH and bacteremia/ infection at [facility]  7. Anemia ckd - Hb 9.0, cont to follow, add darbe for tomorrow 8. Proteus bacteremia: on cipro, has nephrolithiasis that needs to be addressed in f/u with urology 9. Dispo: CIR  Subjective:    Working with OT today.  Balance is a little off.        Objective:   BP (!) 151/84 (BP Location: Left Arm)   Pulse 74   Temp 98 F (36.7 C) (Oral)   Resp 18   Ht 6' (1.829 m)   Wt 83.6 kg   SpO2 100%   BMI 25.00 kg/m   Physical Exam: Gen: NAD CVS:RRR Resp:clear anteriorly Abd: obese, NABS Ext: trace LE Edema NEURO: R facial droop ACCESS: R FA AVF  Labs: BMET Recent Labs  Lab 06/10/19 0810 06/11/19 0928 06/12/19 0441 06/13/19 0346 06/14/19 0247 06/15/19 1357  NA 138 138 138 139 136 137  K 4.3 4.0 4.3 4.9 4.2 4.8  CL 102 99 100 100 98 97*  CO2 24 28 27 28 28 26   GLUCOSE 110* 84 83 139* 107* 145*  BUN 32* 20 33* 48* 23* 44*  CREATININE 12.44* 8.56* 10.13* 12.80* 7.50* 11.18*  CALCIUM 8.6* 8.3* 8.3* 8.3* 8.1* 8.7*  PHOS 2.3* 2.7 2.9 3.0 2.6 2.6   CBC Recent Labs  Lab 06/11/19 0928 06/12/19 0441 06/13/19 0346 06/14/19 0247 06/15/19 1357  WBC 5.6 6.1 5.8  --  7.0  NEUTROABS 3.0  --   --   --   --   HGB 9.0* 9.4* 9.0* 8.9* 9.0*  HCT RESULTS UNAVAILABLE DUE TO  INTERFERING SUBSTANCE RESULTS UNAVAILABLE DUE TO INTERFERING SUBSTANCE RESULTS UNAVAILABLE DUE TO INTERFERING SUBSTANCE RESULTS UNAVAILABLE DUE TO INTERFERING SUBSTANCE RESULTS UNAVAILABLE DUE TO INTERFERING SUBSTANCE  MCV RESULTS UNAVAILABLE DUE TO INTERFERING SUBSTANCE RESULTS UNAVAILABLE DUE TO INTERFERING SUBSTANCE RESULTS UNAVAILABLE DUE TO INTERFERING SUBSTANCE  --  RESULTS UNAVAILABLE DUE TO INTERFERING SUBSTANCE  PLT 289 297 290  --  288    @IMGRELPRIORS @ Medications:    . acyclovir  400 mg Oral QHS  . amLODipine  7.5 mg Oral Daily  . calcium acetate  2,001 mg Oral TID WC  . calcium carbonate  2 tablet Oral QHS  . cinacalcet  60 mg Oral QPM  . ciprofloxacin  500 mg Oral QPM  . clonazePAM  0.5 mg Oral Q M,W,F-HD  . fluticasone furoate-vilanterol  1 puff Inhalation Daily  . insulin aspart  0-5 Units Subcutaneous QHS  . insulin aspart  0-9 Units Subcutaneous TID WC  . lacosamide  150 mg Oral BID  . lacosamide  150 mg Oral Q M,W,F-2000  . lamoTRIgine  150 mg Oral BID  . metoprolol tartrate  12.5 mg Oral BID  .  multivitamin  1 tablet Oral QHS  . pantoprazole  40 mg Oral Daily     Madelon Lips, MD 06/16/2019, 11:36 AM

## 2019-06-16 NOTE — Progress Notes (Signed)
Speech Language Pathology Daily Session Note  Patient Details  Name: Isaiah [name] [facility] MRN: [id number] Date of Birth: Jun 08, 1966  Today's Date: 06/16/2019 SLP Individual Time: 0830-0930 SLP Individual Time Calculation (min): 60 min  Short Term Goals: Week 1: SLP Short Term Goal 1 (Week 1): Pt will recall novel, daily information with compensatory aid with mod A verbal cues. SLP Short Term Goal 2 (Week 1): Pt will demonstrate sustained attention to functional task in 30 minute intervals with supervision A verbal cues for redirection. SLP Short Term Goal 3 (Week 1): Pt will complete basic functional problem solving tasks (ex: ADLs) with min A verbal cues. SLP Short Term Goal 4 (Week 1): Pt will self-monitor and self-correct functional errors in problem solving tasks with mod A verbal cues. SLP Short Term Goal 5 (Week 1): Pt will demonstrate use of speech intelligibility strategies at sentence level for 85% intelligibility with supervision A verbal cues to slow rate and correct articulation errors. SLP Short Term Goal 6 (Week 1): Pt will complete higher level word finding tasks with min A verbal cues for use of strategies.  Skilled Therapeutic Interventions:  Skilled treatment session focused on cognitive linguistic goals. SLP facilitated session by providing verbal organization task. Pt's verbal organization lacked specificity as he wasn't able to identify salient details and organize information/descriptors rather than word finding deficits. Pt's verbal organization improved with Mod A question cues. Pt demonstrated occasional phonetic distortions that appeared related to motor function. Overall intelligibility was > 90% at the sentence level with Mod I use of speech intelligibility strategies. Education provided on difficulty observed and strategies to facilitate organization. Pt demonstrated decreased insight into deficits as he stated there "isn't any need to stay in rehab for 10 days." He further  stated that he could "already do everything he needed to do." Per chart review and pt report, pt recieved CIR and Outpatient ST services following CVA in 2017. Per chart, pt discontinued Outpatient ST services for the exact same reasons. Pt left upright in bed, bed alarm on and all needs within reach. Continue per current plan of care.      Pain Pain Assessment Pain Scale: Faces Pain Score: 0-No pain  Therapy/Group: Individual Therapy  Isaiah [name] 06/16/2019, 3:56 PM

## 2019-06-16 NOTE — Plan of Care (Signed)
  Problem: Consults Goal: RH BRAIN INJURY PATIENT EDUCATION Description: Description: See Patient Education module for eduction specifics Outcome: Progressing Goal: Diabetes Guidelines if Diabetic/Glucose > 140 Description: If diabetic or lab glucose is > 140 mg/dl - Initiate Diabetes/Hyperglycemia Guidelines & Document Interventions  Outcome: Progressing   Problem: RH BOWEL ELIMINATION Goal: RH STG MANAGE BOWEL WITH ASSISTANCE Description: STG Manage Bowel with mod I Assistance. Outcome: Progressing   Problem: RH SKIN INTEGRITY Goal: RH STG SKIN FREE OF INFECTION/BREAKDOWN Outcome: Progressing   Problem: RH SAFETY Goal: RH STG ADHERE TO SAFETY PRECAUTIONS W/ASSISTANCE/DEVICE Description: STG Adhere to Safety Precautions With mod I Assistance/Device. Outcome: Progressing   Problem: RH COGNITION-NURSING Goal: RH STG ANTICIPATES NEEDS/CALLS FOR ASSIST W/ASSIST/CUES Description: STG Anticipates Needs/Calls for Assist With mod I Assistance/Cues. Outcome: Progressing   Problem: RH PAIN MANAGEMENT Goal: RH STG PAIN MANAGED AT OR BELOW PT'S PAIN GOAL Description: Less than 3 out of 10 Outcome: Progressing   Problem: RH KNOWLEDGE DEFICIT BRAIN INJURY Goal: RH STG INCREASE KNOWLEDGE OF SELF CARE AFTER BRAIN INJURY Description: Patient able to demonstrate management of medications to control complications associated with hematoma/seizures Outcome: Progressing

## 2019-06-16 NOTE — Progress Notes (Signed)
Occupational Therapy Session Note  Patient Details  Name: Isaiah [name] MRN: [id number] Date of Birth: 28-Mar-1966  Today's Date: 06/16/2019 OT Individual Time: 4827-0786 OT Individual Time Calculation (min): 53 min    Short Term Goals: Week 1:  OT Short Term Goal 1 (Week 1): STGs=LTGs secondary to short LOS  Skilled Therapeutic Interventions/Progress Updates:    Pt completed bathing and dressing sit to stand at the sink during session.  He was able to transfer from supine to sit EOB and then ambulate over to the wheelchair at the sink with min assist hand held.  He completed UB bathing and dressing with supervision in sitting.  He needed min assist for standing balance while brushing his teeth and applying his deodorant.  He did not attempt undressing his LB or completing any bathing secondary to stating nursing had assisted him with it earlier.  He then ambulated down to the therapy gym with min assist and no assistive device.  Frequent LOB to the right with initial mobility requiring min assist as well.  Once in the therapy gym sats were checked at 92% on room air with HR at 80.  Had pt work in standing on picking up resistive clothespins with the RUE and LUE and placing on the vertical grid.  He demonstrated frequent LOB posteriorly to the right when stepping back and turning to place the clothespins.  Finished session with return to the room and transfer into the recliner per his choice.  Asked pt about his dialysis and how long he had been on it at end of session.  He stated "2017".  When asked how many years it has been, he stated "6".  When corrected he then stated "8".  Finally, pt stated he hasn't been able to calculate items like this since his stroke.  Pt left with call button and phone in reach with safety belt in place.   Therapy Documentation Precautions:  Precautions Precautions: Fall Precaution Comments: R inattention Restrictions Weight Bearing Restrictions: No   Pain: Pain  Assessment Pain Scale: Faces Pain Score: 0-No pain ADL: See Care Tool Section for some details of ADL tasks and mobility  Therapy/Group: Individual Therapy  Keidy Thurgood OTR/L 06/16/2019, 3:40 PM

## 2019-06-16 NOTE — Progress Notes (Signed)
[place] PHYSICAL MEDICINE & REHABILITATION PROGRESS NOTE   Subjective/Complaints:   No issues overnite, has HD ~3pm yesterday   ROS- neg CP, SOB, N/V/D   Objective:   No results found. Recent Labs    06/14/19 0247 06/15/19 1357  WBC  --  7.0  HGB 8.9* 9.0*  HCT RESULTS UNAVAILABLE DUE TO INTERFERING SUBSTANCE RESULTS UNAVAILABLE DUE TO INTERFERING SUBSTANCE  PLT  --  288   Recent Labs    06/14/19 0247 06/15/19 1357  NA 136 137  K 4.2 4.8  CL 98 97*  CO2 28 26  GLUCOSE 107* 145*  BUN 23* 44*  CREATININE 7.50* 11.18*  CALCIUM 8.1* 8.7*    Intake/Output Summary (Last 24 hours) at 06/16/2019 0735 Last data filed at 06/15/2019 1800 Gross per 24 hour  Intake 230 ml  Output 2000 ml  Net -1770 ml     Physical Exam: Vital Signs Blood pressure (!) 151/84, pulse 74, temperature 98 F (36.7 C), temperature source Oral, resp. rate 18, height 6' (1.829 m), weight 83.6 kg, SpO2 100 %.   General: No acute distress Mood and affect are appropriate Heart: Regular rate and rhythm no rubs murmurs or extra sounds Lungs: Clear to auscultation, breathing unlabored, no rales or wheezes Abdomen: Positive bowel sounds, soft nontender to palpation, nondistended Extremities: No clubbing, cyanosis, or edema Skin: No evidence of breakdown, no evidence of rash Neurologic: Cranial nerves II through XII intact, motor strength is 5/5 in left deltoid, bicep, tricep, grip, hip flexor, knee extensors, ankle dorsiflexor and plantar flexor 4/5 RUE, 5/5/RLE Sensory exam normal sensation to light touch and proprioception in bilateral upper and lower extremities Cerebellar exam normal finger to nose to finger as well as heel to shin in bilateral upper and lower extremities Musculoskeletal: Full range of motion in all 4 extremities. No joint swelling   Assessment/Plan: 1. Functional deficits secondary to SDH which require 3+ hours per day of interdisciplinary therapy in a comprehensive  inpatient rehab setting.  Physiatrist is providing close team supervision and 24 hour management of active medical problems listed below.  Physiatrist and rehab team continue to assess barriers to discharge/monitor patient progress toward functional and medical goals  Care Tool:  Bathing    Body parts bathed by patient: Right arm, Left arm, Chest, Abdomen, Front perineal area, Buttocks, Right upper leg, Left upper leg, Face   Body parts bathed by helper: Right lower leg, Left lower leg     Bathing assist Assist Level: Minimal Assistance - Patient > 75%     Upper Body Dressing/Undressing Upper body dressing   What is the patient wearing?: Pull over shirt    Upper body assist Assist Level: Set up assist    Lower Body Dressing/Undressing Lower body dressing      What is the patient wearing?: Underwear/pull up, Pants     Lower body assist Assist for lower body dressing: Minimal Assistance - Patient > 75%     Toileting Toileting    Toileting assist Assist for toileting: Minimal Assistance - Patient > 75%     Transfers Chair/bed transfer  Transfers assist     Chair/bed transfer assist level: Minimal Assistance - Patient > 75%     Locomotion Ambulation   Ambulation assist      Assist level: Moderate Assistance - Patient 50 - 74% Assistive device: No Device Max distance: 150f   Walk 10 feet activity   Assist     Assist level: Moderate Assistance - Patient -  50 - 74% Assistive device: No Device   Walk 50 feet activity   Assist    Assist level: Moderate Assistance - Patient - 50 - 74% Assistive device: No Device    Walk 150 feet activity   Assist Walk 150 feet activity did not occur: Safety/medical concerns(High fatigue level)         Walk 10 feet on uneven surface  activity   Assist     Assist level: Maximal Assistance - Patient 25 - 49%(mod A for ramp, max A when ambulating over mulch) Assistive device: Other (comment)(no AD)    Wheelchair     Assist Will patient use wheelchair at discharge?: No             Wheelchair 50 feet with 2 turns activity    Assist            Wheelchair 150 feet activity     Assist          Blood pressure (!) 151/84, pulse 74, temperature 98 F (36.7 C), temperature source Oral, resp. rate 18, height 6' (1.829 m), weight 83.6 kg, SpO2 100 %.    Medical Problem List and Plan: 1.  Impaired ADLs/mobility and function secondary to acute on chronic SDH with 5 mm of midline shift to left- with R hemiparesis and dysarthria- f/u with NSU after rehab; no head CT unless has a change in function, per NSU Strength and tone unchanged compared to my last office visit 09/18/2016 [facility] PT, OT  2.  Antithrombotics: -DVT/anticoagulation:  Mechanical: Sequential compression devices, below knee Bilateral lower extremities and IVC filter             -antiplatelet therapy: N/A due to SDH 3. Pain Management:  Tylenol prn.  4. Mood: LCSW to follow for evaluation and support.              -antipsychotic agents: N/A 5. Neuropsych: This patient is not fully capable of making decisions on his own behalf. 6. Skin/Wound Care: Routine pressure relief measures.  7. Fluids/Electrolytes/Nutrition: Strict I/O. Labs per HD protocol.   9. Proteus bacteremia/Recent aspiration PNA: Treated with Zosyn and transitioned to cipro on 9/24-->? Duration.   10. Seizure disorder: On Vimpat and Lamictal bid.  Post HD on MWF needs additional doses of clonazepam 0.5 mg and Lamictal 150 mg to prevent breakthrough seizures.  11. T2DM: Monitor BS ac/hs. Continue 12. ESRD: Continue HD MWF at the end of the day to help with tolerance of therapy.  13. HTN: Monitor BP tid--continue amlodipine and metoprolol bid. Mild systolic elevation will increase norvasc to 7.53m 14. Aemia of chronic disease: H/H stable at  156 H/o A fib/PFO: Monitor HR tid--on metoprolol. Was on ASA only. No coumadin due to recurrent GIB and  was on ASA--d/c due to SDH. Vitals:   06/15/19 1800 06/15/19 1951  BP: (!) 145/76 (!) 151/84  Pulse: 75 74  Resp: 16 18  Temp: 98.3 F (36.8 C) 98 F (36.7 C)  SpO2: 99% 100%  HR Controlled 10/8 16. H/o SS thalassemia/PE/DVT: IVC filter placed 09/2015.  17. Spasticity in setting of 2017 CVA with previous R hemiparesis-  chronic 18. Multiple myeloma- [facility] will need f/u after rehab d/c. Labs also need to be monitored at least weekly for now. 19. B/L [facility] no acute effects currently- will determine if they are causing any renal issues- if not, will need intermittent monitoring.  20. OSA  on CPAP- will order CPAP as required for nighttime.  LOS: 2 days A FACE TO FACE EVALUATION WAS PERFORMED  Charlett Blake 06/16/2019, 7:35 AM   HR controlled

## 2019-06-16 NOTE — Progress Notes (Signed)
Patient refused CPAP for the night  

## 2019-06-17 ENCOUNTER — Ambulatory Visit: Payer: Medicare Other | Admitting: Neurology

## 2019-06-17 ENCOUNTER — Inpatient Hospital Stay (HOSPITAL_COMMUNITY): Payer: 59

## 2019-06-17 ENCOUNTER — Inpatient Hospital Stay (HOSPITAL_COMMUNITY): Payer: 59 | Admitting: Speech Pathology

## 2019-06-17 ENCOUNTER — Inpatient Hospital Stay (HOSPITAL_COMMUNITY): Payer: 59 | Admitting: Occupational Therapy

## 2019-06-17 LAB — RENAL FUNCTION PANEL
Albumin: 3.4 g/dL — ABNORMAL LOW (ref 3.5–5.0)
Anion gap: 13 (ref 5–15)
BUN: 43 mg/dL — ABNORMAL HIGH (ref 6–20)
CO2: 27 mmol/L (ref 22–32)
Calcium: 8.9 mg/dL (ref 8.9–10.3)
Chloride: 96 mmol/L — ABNORMAL LOW (ref 98–111)
Creatinine, Ser: 11.32 mg/dL — ABNORMAL HIGH (ref 0.61–1.24)
GFR calc Af Amer: 5 mL/min — ABNORMAL LOW (ref >60)
GFR calc non Af Amer: 4 mL/min — ABNORMAL LOW (ref >60)
Glucose, Bld: 151 mg/dL — ABNORMAL HIGH (ref 70–99)
Phosphorus: 2.2 mg/dL — ABNORMAL LOW (ref 2.5–4.6)
Potassium: 4.6 mmol/L (ref 3.5–5.1)
Sodium: 136 mmol/L (ref 135–145)

## 2019-06-17 LAB — CBC
Hemoglobin: 8.5 g/dL — ABNORMAL LOW (ref 13.0–17.0)
Platelets: 249 10*3/uL (ref 150–400)
WBC: 7.5 10*3/uL (ref 4.0–10.5)
nRBC: 0 % (ref 0.0–0.2)

## 2019-06-17 LAB — GLUCOSE, CAPILLARY
Glucose-Capillary: 86 mg/dL (ref 70–99)
Glucose-Capillary: 157 mg/dL — ABNORMAL HIGH (ref 70–99)
Glucose-Capillary: 92 mg/dL (ref 70–99)
Glucose-Capillary: 128 mg/dL — ABNORMAL HIGH (ref 70–99)

## 2019-06-17 MED ORDER — DARBEPOETIN ALFA 60 MCG/0.3ML IJ SOSY
PREFILLED_SYRINGE | INTRAMUSCULAR | Status: AC
  Administered 2019-06-17: 60 ug via INTRAVENOUS
  Filled 2019-06-17: qty 0.3

## 2019-06-17 NOTE — Progress Notes (Signed)
[place] PHYSICAL MEDICINE & REHABILITATION PROGRESS NOTE   Subjective/Complaints:  No issues overnite, no new c/os this am    ROS- neg CP, SOB, N/V/D   Objective:   No results found. Recent Labs    06/15/19 1357 06/16/19 1202  WBC 7.0 6.3  HGB 9.0* 9.8*  HCT RESULTS UNAVAILABLE DUE TO INTERFERING SUBSTANCE 26.1*  PLT 288 291   Recent Labs    06/15/19 1357 06/16/19 1202  NA 137 137  K 4.8 4.5  CL 97* 97*  CO2 26 29  GLUCOSE 145* 135*  BUN 44* 26*  CREATININE 11.18* 8.74*  CALCIUM 8.7* 8.6*    Intake/Output Summary (Last 24 hours) at 06/17/2019 0736 Last data filed at 06/16/2019 1900 Gross per 24 hour  Intake 480 ml  Output -  Net 480 ml     Physical Exam: Vital Signs Blood pressure (!) 101/59, pulse 77, temperature 99 F (37.2 C), temperature source Oral, resp. rate 18, height 6' (1.829 m), weight 83.6 kg, SpO2 100 %.   General: No acute distress Mood and affect are appropriate Heart: Regular rate and rhythm no rubs murmurs or extra sounds Lungs: Clear to auscultation, breathing unlabored, no rales or wheezes Abdomen: Positive bowel sounds, soft nontender to palpation, nondistended Extremities: No clubbing, cyanosis, or edema Skin: No evidence of breakdown, no evidence of rash Neurologic: Cranial nerves II through XII intact, motor strength is 5/5 in left deltoid, bicep, tricep, grip, hip flexor, knee extensors, ankle dorsiflexor and plantar flexor 4/5 RUE, 5/5/RLE Sensory exam normal sensation to light touch and proprioception in bilateral upper and lower extremities Cerebellar exam normal finger to nose to finger as well as heel to shin in bilateral upper and lower extremities Musculoskeletal: Full range of motion in all 4 extremities. No joint swelling   Assessment/Plan: 1. Functional deficits secondary to SDH which require 3+ hours per day of interdisciplinary therapy in a comprehensive inpatient rehab setting.  Physiatrist is providing close  team supervision and 24 hour management of active medical problems listed below.  Physiatrist and rehab team continue to assess barriers to discharge/monitor patient progress toward functional and medical goals  Care Tool:  Bathing    Body parts bathed by patient: Right arm, Left arm, Chest, Abdomen, Face   Body parts bathed by helper: Left lower leg, Right lower leg, Left upper leg, Right upper leg, Buttocks, Front perineal area, Abdomen, Chest, Left arm, Right arm Body parts n/a: Front perineal area, Buttocks, Right upper leg, Left lower leg, Right lower leg, Left upper leg(Pt declined completing secondary to stating it was done earlier)   Bathing assist Assist Level: Maximal Assistance - Patient 24 - 49%     Upper Body Dressing/Undressing Upper body dressing   What is the patient wearing?: Pull over shirt    Upper body assist Assist Level: Moderate Assistance - Patient 50 - 74%    Lower Body Dressing/Undressing Lower body dressing      What is the patient wearing?: Underwear/pull up, Pants     Lower body assist Assist for lower body dressing: Maximal Assistance - Patient 25 - 49%     Toileting Toileting    Toileting assist Assist for toileting: Minimal Assistance - Patient > 75%     Transfers Chair/bed transfer  Transfers assist     Chair/bed transfer assist level: Minimal Assistance - Patient > 75%     Locomotion Ambulation   Ambulation assist      Assist level: Minimal Assistance - Patient >  75% Assistive device: No Device Max distance: 150'   Walk 10 feet activity   Assist     Assist level: Minimal Assistance - Patient > 75% Assistive device: No Device   Walk 50 feet activity   Assist    Assist level: Minimal Assistance - Patient > 75% Assistive device: No Device    Walk 150 feet activity   Assist Walk 150 feet activity did not occur: Safety/medical concerns(High fatigue level)  Assist level: Minimal Assistance - Patient >  75% Assistive device: No Device    Walk 10 feet on uneven surface  activity   Assist     Assist level: Maximal Assistance - Patient 25 - 49%(mod A for ramp, max A when ambulating over mulch) Assistive device: Other (comment)(no AD)   Wheelchair     Assist Will patient use wheelchair at discharge?: No             Wheelchair 50 feet with 2 turns activity    Assist            Wheelchair 150 feet activity     Assist          Blood pressure (!) 101/59, pulse 77, temperature 99 F (37.2 C), temperature source Oral, resp. rate 18, height 6' (1.829 m), weight 83.6 kg, SpO2 100 %.    Medical Problem List and Plan: 1.  Impaired ADLs/mobility and function secondary to acute on chronic SDH with 5 mm of midline shift to left- with R hemiparesis and dysarthria- f/u with NSU after rehab; no head CT unless has a change in function, per NSU Strength and tone unchanged compared to my last office visit 09/18/2016 [facility] PT, OT anticipate d/c in ~1wk 2.  Antithrombotics: -DVT/anticoagulation:  Mechanical: Sequential compression devices, below knee Bilateral lower extremities and IVC filter             -antiplatelet therapy: N/A due to SDH 3. Pain Management:  Tylenol prn.  4. Mood: LCSW to follow for evaluation and support.              -antipsychotic agents: N/A 5. Neuropsych: This patient is not fully capable of making decisions on his own behalf. 6. Skin/Wound Care: Routine pressure relief measures.  7. Fluids/Electrolytes/Nutrition: Strict I/O. Labs per HD protocol.   9. Proteus bacteremia/Recent aspiration PNA: Treated with Zosyn and transitioned to cipro on 9/24-->? Duration.   10. Seizure disorder: On Vimpat and Lamictal bid.  Post HD on MWF needs additional doses of clonazepam 0.5 mg and Lamictal 150 mg to prevent breakthrough seizures.  11. T2DM: Monitor BS ac/hs. Continue 12. ESRD: Continue HD MWF at the end of the day to help with tolerance of therapy.  13.  HTN: Monitor BP tid--continue amlodipine and metoprolol bid. Mild systolic elevation will increase norvasc to 7.23m 14. Aemia of chronic disease: H/H stable at  124 H/o A fib/PFO: Monitor HR tid--on metoprolol. Was on ASA only. No coumadin due to recurrent GIB and was on ASA--d/c due to SDH. Vitals:   06/16/19 2023 06/17/19 0437  BP: (!) 146/75 (!) 101/59  Pulse: 76 77  Resp: 18 18  Temp: 98.5 F (36.9 C) 99 F (37.2 C)  SpO2: 100% 100%  HR Controlled 10/9 16. H/o SS thalassemia/PE/DVT: IVC filter placed 09/2015.  17. Spasticity in setting of 2017 CVA with previous R hemiparesis-  chronic 18. Multiple myeloma- [facility] will need f/u after rehab d/c. Labs also need to be  monitored at least weekly for now. 19. B/L renal stones- discovered at [facility]- no acute effects currently- will determine if they are causing any renal issues- if not, will need intermittent monitoring.  20. OSA on CPAP- will order CPAP as required for nighttime.  LOS: 3 days A FACE TO FACE EVALUATION WAS PERFORMED  Charlett Blake 06/17/2019, 7:36 AM   HR controlled

## 2019-06-17 NOTE — Progress Notes (Signed)
Occupational Therapy Session Note  Patient Details  Name: Isaiah [name] MRN: [id number] Date of Birth: May 10, 1966  Today's Date: 06/17/2019 OT Individual Time: 9381-8299 OT Individual Time Calculation (min): 73 min    Short Term Goals: Week 1:  OT Short Term Goal 1 (Week 1): STGs=LTGs secondary to short LOS  Skilled Therapeutic Interventions/Progress Updates:    Began session with pt sitting in the bedside chair eating a snack.  Once completed, he stood to ambulate down to the gym.  Upon standing he immediately tried to start walking before getting his balance resulting in severe forward lean and forward LOB.  Max assist needed to keep pt from falling forward into the wall at the bathroom.  He needed max assist to further ambulate around the end of the bed to the wheelchair to sit.  Therapist educated pt on his current balance deficits and that they were more severe this session compared to previous session.  He reported just waking up from a nap as well.  Had pt ambulate from the room with hand held assist to the ortho gym with overall max assist.  Frequent forward LOB with pt attempting to walk to fast.  LOB to the right as well was noted.  Once in the gym had pt sit on the mat and issued a RW for support.  He was able to use the RW to ambulate out in the hallway and back with min assist for balance and mod assist for sequencing walker with turning and for staying inside of it.  Pt next worked on standing balance with use of the [employer] and min assist.  First trial of 60 seconds with use of the LUE in standing produced an average of 4.29 seconds. On the second set he was asked to use only the RUE while standing again with an average number of 4.0 seconds.  Decreased ability noted to isolate the index finger into extension to press the button but instead using the DIP joint.  Next set was completed in standing without use of an assistive device and use of both hands PRN to press the buttons.   Average number of time was 2.73 seconds.  LOB posteriorly noted X1 when attempting to step back and sit down in his chair.  Final set was completed for 2 mins using the RUE isolated in standing with 5.71 second average.  Took him over to the ADL apartment next for practice with simulated walk-in shower transfers.  Mod assist for stepping backwards over the edge with use of the RW and then turning to sit on the seat.  Mod assist for transfer out of the shower secondary to LOB to the right after stepping over the edge with the right.  Finished session with functional mobility back to the room and transfer to the bedside recliner.  Min assist to complete mobility back with the RW with max demonstrational cueing for hand placement before sitting down in the chair.  Pt doffed his shoes with supervision and donned a pullover shirt at the same level.  Call button and phone in reach with safety alarm pad in place.       Therapy Documentation Precautions:  Precautions Precautions: Fall Precaution Comments: R inattention Restrictions Weight Bearing Restrictions: No   Pain: Pain Assessment Pain Scale: 0-10 Pain Score: 0-No pain ADL: See Care Tool Section for some details of mobility  Therapy/Group: Individual Therapy  Waverly Chavarria OTR/L 06/17/2019, 10:29 AM

## 2019-06-17 NOTE — Progress Notes (Signed)
[facility] Progress Note   Subjective: Up in chair, no new C/Os. For HD today on schedule.      Objective Vitals:   06/15/19 1951 06/16/19 1413 06/16/19 2023 06/17/19 0437  BP: (!) 151/84 128/77 (!) 146/75 (!) 101/59  Pulse: 74 67 76 77  Resp: 18 14 18 18   Temp: 98 F (36.7 C) 97.7 F (36.5 C) 98.5 F (36.9 C) 99 F (37.2 C)  TempSrc: Oral Oral Oral Oral  SpO2: 100% 100% 100% 100%  Weight:      Height:       Physical Exam General: Pleasant, NAD Heart: S1,S2, RRR Lungs: CTAB Abdomen:active BS Extremities: No LE edema Dialysis Access: R AVF + Bruit   Additional Objective Labs: Basic Metabolic Panel: Recent Labs  Lab 06/14/19 0247 06/15/19 1357 06/16/19 1202  NA 136 137 137  K 4.2 4.8 4.5  CL 98 97* 97*  CO2 28 26 29   GLUCOSE 107* 145* 135*  BUN 23* 44* 26*  CREATININE 7.50* 11.18* 8.74*  CALCIUM 8.1* 8.7* 8.6*  PHOS 2.6 2.6 2.5   Liver Function Tests: Recent Labs  Lab 06/14/19 0247 06/15/19 1357 06/16/19 1202  ALBUMIN 3.0* 3.4* 3.5   No results for input(s): LIPASE, AMYLASE in the last 168 hours. CBC: Recent Labs  Lab 06/11/19 0928 06/12/19 0441 06/13/19 0346 06/14/19 0247 06/15/19 1357 06/16/19 1202  WBC 5.6 6.1 5.8  --  7.0 6.3  NEUTROABS 3.0  --   --   --   --   --   HGB 9.0* 9.4* 9.0* 8.9* 9.0* 9.8*  HCT RESULTS UNAVAILABLE DUE TO INTERFERING SUBSTANCE RESULTS UNAVAILABLE DUE TO INTERFERING SUBSTANCE RESULTS UNAVAILABLE DUE TO INTERFERING SUBSTANCE RESULTS UNAVAILABLE DUE TO INTERFERING SUBSTANCE RESULTS UNAVAILABLE DUE TO INTERFERING SUBSTANCE 26.1*  MCV RESULTS UNAVAILABLE DUE TO INTERFERING SUBSTANCE RESULTS UNAVAILABLE DUE TO INTERFERING SUBSTANCE RESULTS UNAVAILABLE DUE TO INTERFERING SUBSTANCE  --  RESULTS UNAVAILABLE DUE TO INTERFERING SUBSTANCE 92.2  PLT 289 297 290  --  288 291   Blood Culture    Component Value Date/Time   SDES BLOOD LEFT ANTECUBITAL 06/09/2019 1422   SPECREQUEST  06/09/2019 1422    BOTTLES  DRAWN AEROBIC ONLY Blood Culture results may not be optimal due to an inadequate volume of blood received in culture bottles   CULT  06/09/2019 1422    NO GROWTH 5 DAYS Performed at [facility], [place] [address], [place], [place] [address]    REPTSTATUS 06/14/2019 FINAL 06/09/2019 1422    Cardiac Enzymes: No results for input(s): CKTOTAL, CKMB, CKMBINDEX, TROPONINI in the last 168 hours. CBG: Recent Labs  Lab 06/16/19 1209 06/16/19 1739 06/16/19 2127 06/17/19 0623 06/17/19 1139  GLUCAP 137* 128* 121* 86 157*   Iron Studies: No results for input(s): IRON, TIBC, TRANSFERRIN, FERRITIN in the last 72 hours. @lablastinr3 @ Studies/Results: No results found. Medications: . sodium chloride    . sodium chloride     . acyclovir  400 mg Oral QHS  . amLODipine  7.5 mg Oral Daily  . calcium acetate  2,001 mg Oral TID WC  . calcium carbonate  2 tablet Oral QHS  . Chlorhexidine Gluconate Cloth  6 each Topical Q0600  . cinacalcet  60 mg Oral QPM  . ciprofloxacin  500 mg Oral QPM  . clonazePAM  0.5 mg Oral Q M,W,F-HD  . darbepoetin (ARANESP) injection - DIALYSIS  60 mcg Intravenous Q Fri-HD  . fluticasone furoate-vilanterol  1 puff Inhalation Daily  . insulin aspart  0-5 Units Subcutaneous QHS  . insulin aspart  0-9 Units Subcutaneous TID WC  . lacosamide  150 mg Oral BID  . lacosamide  150 mg Oral Q M,W,F-2000  . lamoTRIgine  150 mg Oral BID  . metoprolol tartrate  12.5 mg Oral BID  . multivitamin  1 tablet Oral QHS  . pantoprazole  40 mg Oral Daily     Outpt HD: [facility]  4h 400/800 92kg 2/2 bathAVFHep 2600 (NO HEP here due to SDH) mircera 225 q2wks,  calcitriol 1.75 ug tiw venofer 50 /wk    Assessment/ Plan: 1. AMS/ acute on chronic SDH - per primary/ neuro/ NSurg.  No surgical intervention needed. 2. ESRD - on HD MWF. NO HEPARIN due to subdural bleed.Next HD today.  3. HTN/ vol - cont meds.euvolemic on exam.BP's ok. 8kg under dry wt, will need new  EDW at d/c 4. Seizures - on 2 AED's and gets clonazepam &extraVimpat after each HD as well 5. DM on insulin 6. Mult myeloma - recent stem-cell transplant plan was aborted due to SDH and bacteremia/ infection at [facility]  7. Anemia ckd - Hb 9.0, cont to follow, add darbe for tomorrow 8. Proteus bacteremia: on cipro, has nephrolithiasis that needs to be addressed in f/u with urology 9. Dispo: CIR  Alsie Younes H. Morningstar Toft NP-C 06/17/2019, 1:10 PM  [employer] [phone]

## 2019-06-17 NOTE — Progress Notes (Signed)
Speech Language Pathology Daily Session Note  Patient Details  Name: Isaiah [name] [facility] MRN: [id number] Date of Birth: 1966/03/22  Today's Date: 06/17/2019 SLP Individual Time: 1118-1200 SLP Individual Time Calculation (min): 42 min  Short Term Goals: Week 1: SLP Short Term Goal 1 (Week 1): Pt will recall novel, daily information with compensatory aid with mod A verbal cues. SLP Short Term Goal 2 (Week 1): Pt will demonstrate sustained attention to functional task in 30 minute intervals with supervision A verbal cues for redirection. SLP Short Term Goal 3 (Week 1): Pt will complete basic functional problem solving tasks (ex: ADLs) with min A verbal cues. SLP Short Term Goal 4 (Week 1): Pt will self-monitor and self-correct functional errors in problem solving tasks with mod A verbal cues. SLP Short Term Goal 5 (Week 1): Pt will demonstrate use of speech intelligibility strategies at sentence level for 85% intelligibility with supervision A verbal cues to slow rate and correct articulation errors. SLP Short Term Goal 6 (Week 1): Pt will complete higher level word finding tasks with min A verbal cues for use of strategies.  Skilled Therapeutic Interventions:  Skilled treatment session focused on cognition goals. SLP facilitated session by providing Mod A cues for answering questions regarding amount of assist required performing ADL tasks. SLP reviewed PT note with pt and despite Max A cues pt continued to provide alternate reasons (other than impaired ability/deficits) for each deficits targeted in conversation. Pt with decreased mental flexibility. Pt impulsively called his wife in effort to specifically establish what activities he would need to perform at home. Wife states that pt must participate in therapy to target deficits in order to more independent at discharge. Pt continued to required Max A redirection to prevent him from providing alternate reasons and perseverating on going home.      6 - PA made this writer aware that pt's wife was requesting that I call her. Then minutes later, pt's nurse found this Probation officer and stated that pt's wife was in his room requesting to see this Probation officer. I went to pt's room. Wife very appreciative of earlier conversation and stated that pt will fully participate from now on in all therapy sessions. She was very assertive with pt that he didn't have "any choice but to participate if he wanted to come home."   Pain Pain Assessment Pain Scale: Faces Pain Score: 0-No pain  Therapy/Group: Individual Therapy  Isaiah [name] 06/17/2019, 12:40 PM

## 2019-06-17 NOTE — Progress Notes (Addendum)
Physical Therapy Session Note  Patient Details  Name: Isaiah [name] MRN: [id number] Date of Birth: [birth year]  Today's Date: 06/17/2019 PT Individual Time: 0800-0916 PT Individual Time Calculation (min): 76 min   Short Term Goals: Week 1:  PT Short Term Goal 1 (Week 1): =LTG due to ELOS  Skilled Therapeutic Interventions/Progress Updates:   Received pt supine in bed with RN present giving medications. Pt agreeable to PT and denied any pain during today's session. Session focused on functional mobility/transfers, ambulation, stair navigation, LE strength, balance/coordination/motor planning, improved activity tolerance, and increased use of RUE. Pt performed supine<>sit min A, donned socks sitting EOB min A to correct R lateral lean and donned shoes mod A. Pt ambulated 31f to sink min A without AD and washed face/brushed teeth min A. Pt was encouraged to use RUE with all tasks. Pt denied ambulating to therapy gym stating he did not get much sleep last night. Pt ambulated 152fx 2 trials without AD in hallway mostly min A but requiring mod A when turning to R. Pt requires verbal cues to slow down, for R foot clearance, and for wider BOS. Pt performed dynamic standing balance playing cornhole using RUE to throw beanbags x1 trial with wide BOS CGA and x 1 trial with narrow BOS min A. Pt performed 3 x10 standing alternating toe taps on step without UE support mod A. Pt reported he "didn't want to do this anymore because he doesn't have to do this at home". Pt was educated on importance of balance activities to prevent falls and activity carry over to functional tasks such as walking. Pt performed forwards/backwards tandem walking inside parallel bars with bilateral UE support CGA x 3 and side stepping inside parallel bars with 1-2 UE support CGA x 4. Pt ambulated 7251f 2 trials without AD to/from stairs min A. Pt navigated 8 steps min A with 2 rails using a reciprocal step pattern when ascending and step  to pattern when descending. Pt required max verbal cues to place entire R foot on step prior to descending with the L foot. Pt reported he "didn't want to do this anymore" and requested to return to his room. Pt was educated on importance of participation in therapy for functional improvement. Pt performed 5x sit<> stand from WC [facility] 50sec relying on bilateral UE support to push up from chair and posterior knee support from WC [facility] complete task. Concluded session with pt sitting in recliner, needs within reach, and chair pad alarm set.    Therapy Documentation Precautions:  Precautions Precautions: Fall Precaution Comments: R inattention Restrictions Weight Bearing Restrictions: No  Therapy/Group: Individual Therapy  AnnAlfonse Alpers, DPT   06/17/2019, 7:53 AM

## 2019-06-17 NOTE — Progress Notes (Signed)
Patient transported to HD.  Brita Romp, RN

## 2019-06-17 NOTE — Progress Notes (Signed)
This Probation officer called dietary around 8:45 to find out where tray was located. Other nurses called around 6:30, and 7:00  To find out location of patient's tray. After getting the round around, Dietary told this writer that the tray left kitchen at 7:24 and should have been there. This Probation officer let the dietary know that there was no tray and that the patient was diabetic.  At this time, no tray was given to patient, pt provided with a tv dinner, and his extra snack. Wife called and this Probation officer let her know what was happening.

## 2019-06-17 NOTE — IPOC Note (Signed)
Overall Plan of Care [facility]) Patient Details Name: Isaiah [name] MRN: [id number] DOB: Mar 11, 1966  Admitting Diagnosis: <principal problem not specified>  Hospital Problems: Active Problems:   SDH (subdural hematoma) (HCC)     Functional Problem List: Nursing Bowel, Medication Management, Safety  PT Balance, Endurance, Motor, Safety  OT Balance, Cognition, Endurance, Motor, Pain, Safety  SLP Cognition  TR         Basic ADL's: OT Grooming, Bathing, Dressing, Toileting     Advanced  ADL's: OT       Transfers: PT Bed Mobility, Bed to Chair, Car, Manufacturing systems engineer, Metallurgist: PT Ambulation, Stairs     Additional Impairments: OT None  SLP Communication, Social Cognition expression Awareness, Attention, Memory, Problem Solving  TR      Anticipated Outcomes Item Anticipated Outcome  Self Feeding n/a  Swallowing      Basic self-care  Supervision  Toileting  Supervision   Bathroom Transfers Supervision  Bowel/Bladder  mod I  Transfers  Supervision  Locomotion  CGA  Communication  Min-Supervision A  Cognition  Mod-Supervision A  Pain  less than 3 out of 10  Safety/Judgment  mod I   Therapy Plan: PT Intensity: Minimum of 1-2 x/day ,45 to 90 minutes PT Frequency: 5 out of 7 days PT Duration Estimated Length of Stay: 10 days OT Intensity: Minimum of 1-2 x/day, 45 to 90 minutes OT Frequency: 5 out of 7 days OT Duration/Estimated Length of Stay: 7-10 days SLP Intensity: Minumum of 1-2 x/day, 30 to 90 minutes SLP Frequency: 3 to 5 out of 7 days SLP Duration/Estimated Length of Stay: 10-12 days   Due to the current state of emergency, patients may not be receiving their 3-hours of Medicare-mandated therapy.   Team Interventions: Nursing Interventions Patient/Family Education, Bowel Management, Disease Management/Prevention, Medication Management, Skin Care/Wound Management  PT interventions Ambulation/gait training, Discharge planning,  Functional mobility training, Therapeutic Activities, Balance/vestibular training, Disease management/prevention, Neuromuscular re-education, Therapeutic Exercise, Wheelchair propulsion/positioning, Cognitive remediation/compensation, DME/adaptive equipment instruction, Pain management, UE/LE Strength taining/ROM, Functional electrical stimulation, Community reintegration, Barrister's clerk education, IT trainer, UE/LE Coordination activities  OT Interventions Training and development officer, Engineer, drilling, Patient/family education, Therapeutic Activities, Cognitive remediation/compensation, Psychosocial support, Therapeutic Exercise, Community reintegration, Functional mobility training, UE/LE Strength taining/ROM, Discharge planning, Neuromuscular re-education, UE/LE Coordination activities, Disease mangement/prevention  SLP Interventions Cognitive remediation/compensation, English as a second language teacher, Functional tasks, Patient/family education, Internal/external aids, Speech/Language facilitation  TR Interventions    SW/CM Interventions Discharge Planning, Psychosocial Support, Patient/Family Education   Barriers to Discharge MD  Medical stability, Hemodialysis and Pending surgery  Nursing      PT      OT      SLP      SW       Team Discharge Planning: Destination: PT-Home ,OT- Home , SLP-Home Projected Follow-up: PT-Outpatient PT, OT-  Home health OT, 24 hour supervision/assistance, SLP-Home Health SLP, Outpatient SLP, 24 hour supervision/assistance Projected Equipment Needs: PT-To be determined, OT- To be determined, SLP-None recommended by SLP Equipment Details: PT-Has cane and RW at home, OT-  Patient/family involved in discharge planning: PT- Patient,  OT-Patient, SLP-Patient  MD ELOS: 7-10d Medical Rehab Prognosis:  Good Assessment:  53 year old male with history of T2DM, A fib, ESRD-HD MWF, A fib, CVA 2017 with CIR stay,SS thalassemia, DVT/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 He developed slurred speech with increase in right sided weakness, worsening of right facial weakness with slurred speech,inability to walkand missed dialysis day PTA 06/09/19 for work up. CT head done revealing 1.5 cm acute on chronic SDH with 4 mm midline shift and chronic left/right frontal, right temporal, occipital and bilateral cerebellar infracts.MRI brain showed right chronic SDH with acute features, 14 mm thickness in parietal lobe and 9 mm thickness in frontal lobe with 5 mm midline shift, negative for acute infarct and generalized atrophy" Dr. Ellene Route consulted and recommended conservative care.  Dr. Leonel Ramsay felt that there was low suspicion for neurological cause and "mild worsening of right sided weakness was in setting of physiological stress of N/V".Blood cultures X 2 negative and he continues on cipro to complete antibiotic regimen for treatment of recent bacteremia?Follow up CT head 10/4 showed stable right SDH with stable mass effect/3-4 mm midline shift and chronic L-MCA, Left cerebellar territory and right PCA infarcts.Mentation improving and therapy initiated showing generalized weakness with poor activity tolerance and cognitive deficits. CIR recommended due to functional decline.   Now requiring 24/7 Rehab RN,MD, as well as CIR level PT, OT and SLP.  Treatment team will focus on ADLs and mobility with goals set at Supervision  See Team Conference Notes for weekly updates to the plan of care

## 2019-06-17 NOTE — Plan of Care (Signed)
  Problem: Consults Goal: RH BRAIN INJURY PATIENT EDUCATION Description: Description: See Patient Education module for eduction specifics Outcome: Progressing Goal: Diabetes Guidelines if Diabetic/Glucose > 140 Description: If diabetic or lab glucose is > 140 mg/dl - Initiate Diabetes/Hyperglycemia Guidelines & Document Interventions  Outcome: Progressing   Problem: RH BOWEL ELIMINATION Goal: RH STG MANAGE BOWEL WITH ASSISTANCE Description: STG Manage Bowel with mod I Assistance. Outcome: Progressing   Problem: RH SKIN INTEGRITY Goal: RH STG SKIN FREE OF INFECTION/BREAKDOWN Outcome: Progressing   Problem: RH SAFETY Goal: RH STG ADHERE TO SAFETY PRECAUTIONS W/ASSISTANCE/DEVICE Description: STG Adhere to Safety Precautions With mod I Assistance/Device. Outcome: Progressing   Problem: RH COGNITION-NURSING Goal: RH STG ANTICIPATES NEEDS/CALLS FOR ASSIST W/ASSIST/CUES Description: STG Anticipates Needs/Calls for Assist With mod I Assistance/Cues. Outcome: Progressing   Problem: RH PAIN MANAGEMENT Goal: RH STG PAIN MANAGED AT OR BELOW PT'S PAIN GOAL Description: Less than 3 out of 10 Outcome: Progressing   Problem: RH KNOWLEDGE DEFICIT BRAIN INJURY Goal: RH STG INCREASE KNOWLEDGE OF SELF CARE AFTER BRAIN INJURY Description: Patient able to demonstrate management of medications to control complications associated with hematoma/seizures Outcome: Progressing

## 2019-06-17 NOTE — H&P (Signed)
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  He was discharged to home on 06/07/19 on cipro but has had progressive weakness with inability to tolerate po's. He developed slurred speech with increase in right sided weakness, worsening of right facial weakness with slurred speech, inability to walk and missed dialysis day PTA 06/09/19 for work up. CT head done revealing 1.5 cm acute on chronic SDH with 4 mm midline shift and chronic left/right frontal, right temporal, occipital and bilateral cerebellar infracts. MRI brain showed right chronic SDH with acute features, 14 mm thickness in parietal lobe and 9 mm thickness in frontal lobe with 5 mm midline shift, negative for acute infarct and generalized atrophy"  Dr. Ellene Route consulted and recommended conservative care.   Dr. Leonel Ramsay felt that there was low suspicion for neurological cause and "mild worsening of right sided weakness was in setting of physiological stress of N/V".  Blood cultures X 2 negative and he continues on cipro to complete antibiotic regimen for treatment of  recent bacteremia?  Follow up CT head 10/4 showed stable right SDH with stable mass effect/3-4 mm midline shift and chronic L-MCA, Left cerebellar territory and right PCA infarcts.  Mentation improving and therapy initiated showing generalized weakness with poor activity tolerance  and cognitive deficits. CIR recommended due to functional decline.    Spoke with Dr Denman George- he suggested to order CT of head on CIR IF patient's level of function declined in any way.   LBM "just a few minutes ago"- doesn't feel constipated.         Review of Systems  Constitutional: Negative for chills and fever.  HENT: Negative for hearing loss.   Eyes: Positive for blurred vision (on and off).  Respiratory: Negative for cough and shortness of breath.   Cardiovascular: Negative for chest pain and palpitations.  Gastrointestinal: Negative for constipation, heartburn and nausea.  Musculoskeletal: Negative for myalgias.  Skin: Negative for rash.  Neurological: Positive for speech change and focal weakness. Negative for dizziness.  Psychiatric/Behavioral: Positive for memory loss. The patient is not nervous/anxious.   All other systems reviewed and are negative.         Past Medical History:  Diagnosis Date   DM type 2 (diabetes mellitus, type 2) ([place]) 06/09/2019   Essential hypertension 06/09/2019   GIB (gastrointestinal bleeding)      Recurrent episodes- 09/2014, 01/201 and 06/2016   Gout     Multiple myeloma ([place])     Pulmonary emboli ([place]) 07/2011    bilateral--treated with comadin X 6 months   Seizure disorder ([place]) 06/09/2019   Sepsis ([place])     Sleep apnea     Stroke ([place]) 09/2015    R-MCA, L-MCA, PCA and bilateral cerebellar complicated by DVT/PE   Subdural hematoma ([place]) 05/2019      History reviewed. No pertinent surgical history.  Family History  Problem Relation Age of Onset   Hypertension Mother     Hypertension Father     Stroke Father     Sickle cell anemia Brother        Social History:  Married. Independent without AD but sedentary--lays around on days after dialysis. He reports that he has never smoked. He has never used smokeless tobacco. He reports previous alcohol use. He reports that he does not use drugs.  Pt lives in 2  Story home with 2 STE- MBR on 2nd floor- no bedroom on 1st floor- wife works at [employer].        Allergies  Allergen Reactions   Codeine Swelling    Medications Prior to Admission  Medication Sig Dispense Refill   acyclovir (ZOVIRAX) 400 MG tablet Take 400 mg by mouth at bedtime.       amLODipine (NORVASC) 5 MG tablet Take 5 mg by mouth daily.       aspirin EC 81 MG tablet Take 81 mg by mouth daily.       calcium acetate (PHOSLO) 667 MG capsule Take 2,001 mg by mouth 3 (three) times daily with meals.        calcium carbonate (TUMS - DOSED IN MG ELEMENTAL CALCIUM) 500 MG chewable tablet Chew 2 tablets by mouth at bedtime.       cinacalcet (SENSIPAR) 60 MG tablet Take 60 mg by mouth every evening.       ciprofloxacin (CIPRO) 500 MG tablet Take 500 mg by mouth every evening. For 120 days       clonazePAM (KLONOPIN) 0.5 MG tablet Take 0.5 mg by mouth every Monday, Wednesday, and Friday with hemodialysis.       fluticasone furoate-vilanterol (BREO ELLIPTA) 200-25 MCG/INH AEPB Inhale 1 puff into the lungs daily.       insulin lispro (HUMALOG) 100 UNIT/ML injection Inject 0-4 Units into the skin See admin instructions. Sliding Scale: Dexcom Meter If blood sugar is 150-200 give 2u, if 201-250=3u, 251 or greater give 4u       Lacosamide (VIMPAT) 150 MG TABS Take 150 mg by mouth See admin instructions. Take 1 capsule (157m) by mouth twice daily all day except on Mon, Wed, Fri take 1 capsule in the morning, 1 capsule after dialysis, and 1 capsule at bedtime.       lamoTRIgine (LAMICTAL) 150 MG tablet Take 150 mg by mouth 2 (two) times daily.       metoprolol tartrate (LOPRESSOR) 25 MG tablet Take 12.5 mg by mouth 2 (two) times daily.       multivitamin (RENA-VIT) TABS tablet Take 1 tablet by mouth at bedtime.       pantoprazole (PROTONIX) 40 MG tablet Take 40 mg by mouth daily.       prochlorperazine (COMPAZINE) 10 MG tablet Take 10 mg by mouth every 6 (six) hours as needed for  nausea or vomiting.          Drug Regimen Review  Drug regimen was reviewed and remains appropriate with no significant issues identified   Home: Home Living Family/patient expects to be discharged to:: Private residence Living Arrangements: Spouse/significant other, Children Available Help at Discharge: Family, Available 24 hours/day Type of Home: House Home Access: Stairs to enter ECenterPoint Energyof Steps: 2 Entrance Stairs-Rails: Right Home Layout: Two level, 1/2 bath on main level, Bed/bath upstairs Alternate Level Stairs-Number of Steps: flight Bathroom Shower/Tub: WGaffer Door BConocoPhillipsToilet: Standard Bathroom Accessibility: Yes Home Equipment: GMuseum/gallery curator  bars - tub/shower, Hand held shower head, Walker - 4 wheels  Lives With: Spouse, Son(60 year old son, [place] lives with parents)   Functional History: Prior Function Level of Independence: Independent with assistive device(s) Comments: did not use RW at all times per wife, Independent prior to recent [facility] admit   Functional Status:  Mobility: Bed Mobility Overal bed mobility: Needs Assistance Bed Mobility: Supine to Sit Supine to sit: Min guard, HOB elevated General bed mobility comments: Increased time with HOB elevated and use of bed rail Transfers Overall transfer level: Needs assistance Equipment used: Rolling walker (2 wheeled) Transfers: Sit to/from Stand Sit to Stand: Mod assist General transfer comment: requires cueing for hand placement and technique, requires mod assist to ascend after several trials  Ambulation/Gait Ambulation/Gait assistance: Min assist, Mod assist Gait Distance (Feet): 80 Feet Assistive device: Rolling walker (2 wheeled) Gait Pattern/deviations: Step-through pattern, Decreased stride length, Decreased weight shift to right, Leaning posteriorly General Gait Details: Slow gait with RW, initial minA for balance and safety with RW; pt easily fatigued with BLE buckling noted  requiring intermittent modA for balance, also increased time processing with RW eventually requiring physical assist to get RW wheel unstuck. Poor awareness Gait velocity: Decreased Gait velocity interpretation: <1.31 ft/sec, indicative of household ambulator   ADL: ADL Overall ADL's : Needs assistance/impaired Eating/Feeding: Modified independent, Sitting Eating/Feeding Details (indicate cue type and reason): in recliner, increased time due to slower movement of RUE Grooming: Minimal assistance, Standing Grooming Details (indicate cue type and reason): standing at sink to complete oral care and washing face, preference to at least 1 UE support; encouarged using R dominant UE to brush teeth but pt easily frustrated and switched to L hand (then weightbearing through R UE in standing); able to squeeze washcloth with BUEs before washing face; cueing for balance and noted R sided lean and knee buckling with fatigue Upper Body Bathing: Minimal assistance Upper Body Bathing Details (indicate cue type and reason): in recliner, increased time due to slower movement of RUE Lower Body Bathing: Moderate assistance Lower Body Bathing Details (indicate cue type and reason): min A sit<>stand but mod A for standing balance Upper Body Dressing : Minimal assistance, Sitting Upper Body Dressing Details (indicate cue type and reason): in recliner, increased time due to slower movement of RUE Lower Body Dressing: Maximal assistance Lower Body Dressing Details (indicate cue type and reason): min A sit<>stand but mod A for standing balance Toilet Transfer: Moderate assistance, Ambulation, RW Toilet Transfer Details (indicate cue type and reason): simulated to recliner  Toileting- Clothing Manipulation and Hygiene: Maximal assistance Toileting - Clothing Manipulation Details (indicate cue type and reason): min A sit<>stand but mod A for standing balance Functional mobility during ADLs: Minimal assistance, Rolling  walker, Cueing for safety General ADL Comments: pt limited by weakness (R), impaired balance   Cognition: Cognition Overall Cognitive Status: Within Functional Limits for tasks assessed Orientation Level: Oriented X4 Cognition Arousal/Alertness: Awake/alert Behavior During Therapy: Flat affect Overall Cognitive Status: Within Functional Limits for tasks assessed     Blood pressure 126/69, pulse 72, temperature 98.7 F (37.1 C), temperature source Oral, resp. rate 11, height 5' 10"  (1.778 m), weight 84 kg, SpO2 98 %. Physical Exam  Nursing note and vitals reviewed. Constitutional: He appears well-developed and well-nourished. No distress.  Sitting up in chair at bedside; talking on phone- slightly dysarthric, NAD  HENT:  Head: Normocephalic and atraumatic.  Eyes: Pupils are equal, round, and reactive to light. Conjunctivae are normal.  No scleral icterus.  Conjugate gaze B/L- EOMI B/L  Neck: Normal range of motion. Neck supple. No tracheal deviation present. No thyromegaly present.  Cardiovascular:  Irregularly irregular- rate controlled  Respiratory:  CTA B/L- no W/R/R- good air movement  GI: Soft. Bowel sounds are normal. He exhibits no distension. There is no abdominal tenderness.  Musculoskeletal:     Comments: LUE and LLE 5/5 RUE bicep 4+/5, WE 4/5, tricep 4/5, grip 4-/5, finger abd 3-/5 RLE 4+/5 in HF/KE/KF/DF/and PF  Neurological: He is alert.  Mild right facial weakness with slow speech--mildly dysarthric. Slow to initiate and needed occasional cues for 2 step commands.  Fingers curled due to tone slightly on R hand Hoffman's in RUE- not on LUE MAS of 1 in R wrist/hand/fingers  Skin: Skin is warm and dry. No rash noted. He is not diaphoretic. No erythema.  Has a great thrill in R forearm      Lab Results Last 48 Hours        Results for orders placed or performed during the hospital encounter of 06/09/19 (from the past 48 hour(s))  Glucose, capillary     Status:  Abnormal    Collection Time: 06/12/19  4:18 PM  Result Value Ref Range    Glucose-Capillary 153 (H) 70 - 99 mg/dL    Comment 1 Notify RN    Glucose, capillary     Status: Abnormal    Collection Time: 06/12/19  8:51 PM  Result Value Ref Range    Glucose-Capillary 117 (H) 70 - 99 mg/dL  CBC     Status: Abnormal    Collection Time: 06/13/19  3:46 AM  Result Value Ref Range    WBC 5.8 4.0 - 10.5 K/uL    RBC RESULTS UNAVAILABLE DUE TO INTERFERING SUBSTANCE 4.22 - 5.81 MIL/uL    Hemoglobin 9.0 (L) 13.0 - 17.0 g/dL    HCT RESULTS UNAVAILABLE DUE TO INTERFERING SUBSTANCE 39.0 - 52.0 %    MCV RESULTS UNAVAILABLE DUE TO INTERFERING SUBSTANCE 80.0 - 100.0 fL    MCH RESULTS UNAVAILABLE DUE TO INTERFERING SUBSTANCE 26.0 - 34.0 pg    MCHC RESULTS UNAVAILABLE DUE TO INTERFERING SUBSTANCE 30.0 - 36.0 g/dL    RDW RESULTS UNAVAILABLE DUE TO INTERFERING SUBSTANCE 11.5 - 15.5 %    Platelets 290 150 - 400 K/uL    nRBC RESULTS UNAVAILABLE DUE TO INTERFERING SUBSTANCE 0.0 - 0.2 %      Comment: Performed at [facility], [place] [address] [place], [place] [address]  Renal function panel     Status: Abnormal    Collection Time: 06/13/19  3:46 AM  Result Value Ref Range    Sodium 139 135 - 145 mmol/L    Potassium 4.9 3.5 - 5.1 mmol/L    Chloride 100 98 - 111 mmol/L    CO2 28 22 - 32 mmol/L    Glucose, Bld 139 (H) 70 - 99 mg/dL    BUN 48 (H) 6 - 20 mg/dL    Creatinine, Ser 12.80 (H) 0.61 - 1.24 mg/dL    Calcium 8.3 (L) 8.9 - 10.3 mg/dL    Phosphorus 3.0 2.5 - 4.6 mg/dL    Albumin 3.1 (L) 3.5 - 5.0 g/dL    GFR calc non Af Amer 4 (L) >60 mL/min    GFR calc Af Amer 4 (L) >60 mL/min    Anion gap 11 5 - 15      Comment: Performed at [facility]  [address], [place], [place] [address]  Glucose, capillary     Status: Abnormal    Collection Time: 06/13/19  6:03 AM  Result Value Ref Range    Glucose-Capillary 112 (H) 70 - 99 mg/dL  Glucose, capillary     Status: None    Collection Time:  06/13/19  4:34 PM  Result Value Ref Range    Glucose-Capillary 85 70 - 99 mg/dL    Comment 1 Notify RN      Comment 2 Document in Chart    Glucose, capillary     Status: Abnormal    Collection Time: 06/13/19  9:23 PM  Result Value Ref Range    Glucose-Capillary 157 (H) 70 - 99 mg/dL  Renal function panel     Status: Abnormal    Collection Time: 06/14/19  2:47 AM  Result Value Ref Range    Sodium 136 135 - 145 mmol/L    Potassium 4.2 3.5 - 5.1 mmol/L    Chloride 98 98 - 111 mmol/L    CO2 28 22 - 32 mmol/L    Glucose, Bld 107 (H) 70 - 99 mg/dL    BUN 23 (H) 6 - 20 mg/dL    Creatinine, Ser 7.50 (H) 0.61 - 1.24 mg/dL      Comment: DELTA CHECK NOTED    Calcium 8.1 (L) 8.9 - 10.3 mg/dL    Phosphorus 2.6 2.5 - 4.6 mg/dL    Albumin 3.0 (L) 3.5 - 5.0 g/dL    GFR calc non Af Amer 7 (L) >60 mL/min    GFR calc Af Amer 9 (L) >60 mL/min    Anion gap 10 5 - 15      Comment: Performed at [facility], [place] [address] [place], [place] [address]  Hemoglobin and hematocrit, blood     Status: Abnormal    Collection Time: 06/14/19  2:47 AM  Result Value Ref Range    Hemoglobin 8.9 (L) 13.0 - 17.0 g/dL    HCT RESULTS UNAVAILABLE DUE TO INTERFERING SUBSTANCE 39.0 - 52.0 %      Comment: Performed at [facility] [address], [place], [place] [address]  Glucose, capillary     Status: Abnormal    Collection Time: 06/14/19  5:29 AM  Result Value Ref Range    Glucose-Capillary 106 (H) 70 - 99 mg/dL  Glucose, capillary     Status: Abnormal    Collection Time: 06/14/19 11:07 AM  Result Value Ref Range    Glucose-Capillary 111 (H) 70 - 99 mg/dL     Imaging Results (Last 48 hours)  No results found.           Medical Problem List and Plan: 1.  Impaired ADLs/mobility and function secondary to acute on chronic SDH with 5 mm of midline shift to left- with R hemiparesis and dysarthria- f/u with NSU after rehab; no head CT unless has a change in function, per NSU 2.   Antithrombotics: -DVT/anticoagulation:  Mechanical: Sequential compression devices, below knee Bilateral lower extremities and IVC filter             -antiplatelet therapy: N/A due to SDH 3. Pain Management:  Tylenol prn.  4. Mood: LCSW to follow for evaluation and support.              -antipsychotic agents: N/A 5. Neuropsych: This patient is not fully capable of making decisions on his own behalf. 6. Skin/Wound Care: Routine pressure relief measures.  7. Fluids/Electrolytes/Nutrition: Strict  I/O. Labs per HD protocol.   9. Proteus bacteremia/Recent aspiration PNA: Treated with Zosyn and transitioned to cipro on 9/24-->? Duration.   10. Seizure disorder: On Vimpat and Lamictal bid.  Post HD on MWF needs additional doses of clonazepam 0.5 mg and Lamictal 150 mg to prevent breakthrough seizures.  11. T2DM: Monitor BS ac/hs. Continue 12. ESRD: Continue HD MWF at the end of the day to help with tolerance of therapy.  13. HTN: Monitor BP tid--continue amlodipine and metoprolol bid.  14. Aemia of chronic disease: H/H stable at  15. H/o A fib/PFO: Monitor HR tid--on metoprolol. Was on ASA only. No coumadin due to recurrent GIB and was on ASA--d/c due to SDH. 16. H/o SS thalassemia/PE/DVT: IVC filter placed 09/2015.  17. Spasticity in setting of 2017 CVA with previous R hemiparesis- not clear if chronic or new- pt was insistent had no residual effects from 2017 CVA- will closely monitor and treat if affects function. 18. Multiple myeloma- was scheduled for autologous stem cell transplant at [facility]- will need f/u after rehab d/c. Labs also need to be monitored at least weekly for now. 19. B/L renal stones- discovered at [name]- no acute effects currently- will determine if they are causing any renal issues- if not, will need intermittent monitoring.  20. OSA on CPAP- will order CPAP as required for nighttime.             Bary Leriche, PA-C 06/14/2019

## 2019-06-18 ENCOUNTER — Inpatient Hospital Stay (HOSPITAL_COMMUNITY): Payer: 59

## 2019-06-18 ENCOUNTER — Inpatient Hospital Stay (HOSPITAL_COMMUNITY): Payer: 59 | Admitting: Speech Pathology

## 2019-06-18 ENCOUNTER — Inpatient Hospital Stay (HOSPITAL_COMMUNITY): Payer: 59 | Admitting: Physical Therapy

## 2019-06-18 LAB — GLUCOSE, CAPILLARY
Glucose-Capillary: 116 mg/dL — ABNORMAL HIGH (ref 70–99)
Glucose-Capillary: 124 mg/dL — ABNORMAL HIGH (ref 70–99)
Glucose-Capillary: 128 mg/dL — ABNORMAL HIGH (ref 70–99)
Glucose-Capillary: 46 mg/dL — ABNORMAL LOW (ref 70–99)
Glucose-Capillary: 101 mg/dL — ABNORMAL HIGH (ref 70–99)

## 2019-06-18 NOTE — Progress Notes (Signed)
Speech Language Pathology Daily Session Note  Patient Details  Name: Isaiah [name] [facility] MRN: [id number] Date of Birth: [birth year]  Today's Date: 06/18/2019 SLP Individual Time: 1350-1420 SLP Individual Time Calculation (min): 30 min  Short Term Goals: Week 1: SLP Short Term Goal 1 (Week 1): Pt will recall novel, daily information with compensatory aid with mod A verbal cues. SLP Short Term Goal 2 (Week 1): Pt will demonstrate sustained attention to functional task in 30 minute intervals with supervision A verbal cues for redirection. SLP Short Term Goal 3 (Week 1): Pt will complete basic functional problem solving tasks (ex: ADLs) with min A verbal cues. SLP Short Term Goal 4 (Week 1): Pt will self-monitor and self-correct functional errors in problem solving tasks with mod A verbal cues. SLP Short Term Goal 5 (Week 1): Pt will demonstrate use of speech intelligibility strategies at sentence level for 85% intelligibility with supervision A verbal cues to slow rate and correct articulation errors. SLP Short Term Goal 6 (Week 1): Pt will complete higher level word finding tasks with min A verbal cues for use of strategies.  Skilled Therapeutic Interventions:  Skilled treatment session focused on communication goals. SLP facilitated session by introducing speech intelligibility strategies (SLOP). Pt required Max A to comprehend strategies as he attempted to use the words given in each strategy within sentences. SLP removed sheet from pt's hand and posted on bathroom door. Only then did pt cease attempting to create sentences from the strategies. However he did produce more intelligible speech at the sentence even within a mildly noisy environment. He was able to achieve ~ 90% intelligibility at the sentence level. Pt left upright in recliner, chair alarm on and all needs within reach. Continue per current plan of care.      Pain    Therapy/Group: Individual Therapy  Isaiah [name] 06/18/2019,  2:24 PM

## 2019-06-18 NOTE — Progress Notes (Signed)
[facility] Progress Note   Subjective:  Patient seen and examined at bedside.     Objective Vitals:   06/17/19 2010 06/18/19 0505 06/18/19 0905 06/18/19 1010  BP: 124/76 (!) 102/56  (!) 146/34  Pulse: 78 73    Resp: 18 18    Temp: 97.7 F (36.5 C) 98.8 F (37.1 C)    TempSrc: Oral Oral    SpO2: 100% 97% 97%   Weight:  84.9 kg    Height:       Physical Exam General:NAD, WNWD male, sitting in bedside chair Heart:RRR, no mrg Lungs:CTAB Abdomen:soft, NTND Extremities:no LE edema Dialysis Access: R AVF +b/t   Filed Weights   06/17/19 1345 06/17/19 1724 06/18/19 0505  Weight: 85.6 kg 82.3 kg 84.9 kg    Intake/Output Summary (Last 24 hours) at 06/18/2019 1533 Last data filed at 06/18/2019 1327 Gross per 24 hour  Intake 240 ml  Output 2500 ml  Net -2260 ml    Additional Objective Labs: Basic Metabolic Panel: Recent Labs  Lab 06/15/19 1357 06/16/19 1202 06/17/19 1338  NA 137 137 136  K 4.8 4.5 4.6  CL 97* 97* 96*  CO2 _0 GLUCOSE 145* 135* 151*  BUN 44* 26* 43*  CREATININE 11.18* 8.74* 11.32*  CALCIUM 8.7* 8.6* 8.9  PHOS 2.6 2.5 2.2*   Liver Function Tests: Recent Labs  Lab 06/15/19 1357 06/16/19 1202 06/17/19 1338  ALBUMIN 3.4* 3.5 3.4*  CBC: Recent Labs  Lab 06/12/19 0441 06/13/19 0346  06/15/19 1357 06/16/19 1202 06/17/19 1338  WBC 6.1 5.8  --  7.0 6.3 7.5  HGB 9.4* 9.0*   < > 9.0* 9.8* 8.5*  HCT RESULTS UNAVAILABLE DUE TO INTERFERING SUBSTANCE RESULTS UNAVAILABLE DUE TO INTERFERING SUBSTANCE   < > RESULTS UNAVAILABLE DUE TO INTERFERING SUBSTANCE 26.1* RESULTS UNAVAILABLE DUE TO INTERFERING SUBSTANCE  MCV RESULTS UNAVAILABLE DUE TO INTERFERING SUBSTANCE RESULTS UNAVAILABLE DUE TO INTERFERING SUBSTANCE  --  RESULTS UNAVAILABLE DUE TO INTERFERING SUBSTANCE 92.2 RESULTS UNAVAILABLE DUE TO INTERFERING SUBSTANCE  PLT 297 290  --  288 291 249   < > = values in this interval not displayed.   Blood Culture    Component Value  Date/Time   SDES BLOOD LEFT ANTECUBITAL 06/09/2019 1422   SPECREQUEST  06/09/2019 1422    BOTTLES DRAWN AEROBIC ONLY Blood Culture results may not be optimal due to an inadequate volume of blood received in culture bottles   CULT  06/09/2019 1422    NO GROWTH 5 DAYS Performed at [facility], [place] [address], [place], [place] [address]    REPTSTATUS 06/14/2019 FINAL 06/09/2019 1422    CBG: Recent Labs  Lab 06/17/19 1139 06/17/19 1842 06/17/19 2314 06/18/19 0619 06/18/19 1131  GLUCAP 157* 92 128* 116* 124*    Medications: . sodium chloride    . sodium chloride     . acyclovir  400 mg Oral QHS  . amLODipine  7.5 mg Oral Daily  . calcium acetate  2,001 mg Oral TID WC  . calcium carbonate  2 tablet Oral QHS  . Chlorhexidine Gluconate Cloth  6 each Topical Q0600  . cinacalcet  60 mg Oral QPM  . ciprofloxacin  500 mg Oral QPM  . clonazePAM  0.5 mg Oral Q M,W,F-HD  . darbepoetin (ARANESP) injection - DIALYSIS  60 mcg Intravenous Q Fri-HD  . fluticasone furoate-vilanterol  1 puff Inhalation Daily  . insulin aspart  0-5 Units Subcutaneous QHS  . insulin aspart  0-9 Units Subcutaneous TID WC  . lacosamide  150 mg Oral BID  . lacosamide  150 mg Oral Q M,W,F-2000  . lamoTRIgine  150 mg Oral BID  . metoprolol tartrate  12.5 mg Oral BID  . multivitamin  1 tablet Oral QHS  . pantoprazole  40 mg Oral Daily    Dialysis Orders: [facility] - MWF  4h 400/800 92kg 2/2 bathAVFHep 2600 (NO HEP here due to SDH) mircera 225 q2wks,  calcitriol 1.75 ug tiw venofer 50 /wk  Assessment/Plan: 1. AMS/acute on chronic SDH - per primary/neuro/NSurg - no surgical intervention needed. 2. Proteus bacteremia: on cipro. Has nephrolithiasis that needs to be addressed w/urology f/u.  3. ESRD - on HD MWF.  NO HEPARIN d/t subdural bleed.  Next HD 10/12. 4. Seizures - on 2 AED's. Gets clonazepam & extra Vimpat after each HD as well.  5. Anemia of CKD- Hgb 8.5. Aranesp 73mg started on 10/9,  continue qwk.   6. Secondary hyperparathyroidism - Ca and phos at goal. Continue VDRA & binders.  7. HTN/volume - BP variable but mostly well controlled, change amlodipine to bedtime. Continue 7.531mamlodipine qhs, lopressor 12.54m46mID.  Hold HTN meds prior to HD.  If BP continues to be soft, reduce amlodipine back to 54mg83m.  8. Nutrition - Renal diet w/fluid restrictions. 9. DM - on insulin.  10. Multiple myeloma - recent stem cell transplant planned but aborted d/t SDH [facility]. Dispo - on CIR  LindJen Mow-C [facility] Pager: [phone]/2020,3:33 PM  LOS: 4 days

## 2019-06-18 NOTE — Progress Notes (Signed)
Hypoglycemic Event  CBG: 46  Treatment: Night time snack and sprite  Symptoms: Asymptomatic   Follow-up CBG: Time: 2147 CBG Result: 101  Possible Reasons for Event:   Comments/MD notified: MD Alysia Penna    Chilton Si

## 2019-06-18 NOTE — Progress Notes (Signed)
Physical Therapy Session Note  Patient Details  Name: Isaiah [name] [facility] MRN: [id number] Date of Birth: Jan 23, 1966  Today's Date: 06/18/2019 PT Individual Time: 1310-1350 PT Individual Time Calculation (min): 40 min   Short Term Goals: Week 1:  PT Short Term Goal 1 (Week 1): =LTG due to ELOS Week 2:     Skilled Therapeutic Interventions/Progress Updates:   PAIN: denies pain Pt initially OOB in recliner.  Shoes placed on feet but not tied.  PT Able to ant wt shift and tie one shoe, assist needed only to tighten.  Therapist tied second shoe at pt request.  STS from recliner w/cga.  Gait 37f to table where pt donned his mask without assist, cga for balance.  Gait 1541froom to gym w/min to mod assist due to ant tendency/lean and for increasing L wt shift for clearance RLE thru swing.    In gym:   Repeated STS 1x10, 1x9 for LE strengthening and balance training w/transition  Tall kneeling w/min assist and cues due to ant lean which progresses gradually.  Worked on midline orientation in this position.  Sit to stand to Quadruped w/min assist and cues for upright posture w/turning. In quadruped performed leg lifts w/min assist and cues for encouraging neutral spine and bilat elbow extension.  2 sets of 10, much difficulty w/fatigue, R UE weakness.  Gait 3069f2 in gym to/fro stairs. W/min to mod assist as above. Ascends/descends 4steps x2 w/2rails and cga, min cues for placement R foot /attention to R hand.  Standing balance/bimanual task performed of reaching laterally for beanbags and reaching overhead to drop in high basketball hoop to promote extension in standing and use of each hand.  Alternated L to R hand. Performed 5each and repeated complete task x 2 w/rest in sitting.  Gait 150f58fno AD and min assist for wt shifting, very mild ant tendency, cues for cadence, increased cadence does result in increased tendency to lean anteriorly.  Anterior lean improved w/good carryover foloowing  balance/trunk control/wt shifting activities.   PM Session: Pain  bilat calf pain "soreness" 4/10. Which he attributes to am quadruped activities.   No swelling or redness noted.  Pt initially OOB in recliner.  STS w/cga.  Gait 150ft9fin assist due to ant tendency, cues for erect trunk.  Repeated STS from mat in lowest position 2x10reps for LE strengthening.  For balance, midline orientation and bilat UE task performed Standing w/2lb bar held in bilat UE's at shoulder height pt performed rows w/cues for full scapular retraction, cga to min assist for balance, occasional post tendency/increase AP sway.   Repeated 20 reps, repeated x 2.  Sidestepping 8ft L46fw/min to cga for balance. STS - sidestep-sit-stand sidestep repeated length of mat x 4 w/min assist performed for strengthening, balance w/transitional mvmt  retrogait w/single hand on hi low table 5ft x 9falternating LUE and RUE for support, cga for balance.  Gait 150ft gy22f room w/min assist due to ant tendency, cues for cadence/posture. Turns/sits in recliner w/min assist, cues , decreased eccentric control.  Pt left oob in recliner w/chair alarm set, needs in reach.  Pt demonstrates fluctuating levels of assist required due to varying degrees of anterior lean/tendency.   Therapy Documentation Precautions:  Precautions Precautions: Fall Precaution Comments: R inattention Restrictions Weight Bearing Restrictions: No    Therapy/Group: Individual Therapy  Cailie Bosshart Callie FieldingarRed Springs020, 2:54 PM

## 2019-06-18 NOTE — Progress Notes (Signed)
Physical Therapy Session Note  Patient Details  Name: Isaiah [name] MRN: [id number] Date of Birth: [birth year]  Today's Date: 06/18/2019 PT Individual Time: 0803-0847 PT Individual Time Calculation (min): 44 min   Short Term Goals: Week 1:  PT Short Term Goal 1 (Week 1): =LTG due to ELOS  Skilled Therapeutic Interventions/Progress Updates:   Pt received asleep, supine in bed and agreeable to therapy session upon awakening. Supine>sit, HOB partially elevated and using bedrails, with close supervision and increased time. Pt denies lightheaded/dizziness. Sit>stand EOB>no AD with min assist for balance. Ambulated ~50f, ~194fin/out of bathroom, no AD, with min assist for balance due to increased postural sway. Sit<>stand on/off toilet using grab bars with min assist. Standing LB clothing management with min assist for balance - continent of of BM and bladder - performed peri-care with set-up assist and therapist assisting with cleanliness. Sitting hand hygiene at sink. Ambulated ~15045fo therapy gym, no AD, with min assist for balance as pt continues to demonstrate increased postural sway and increased path deviation with decreased B LE step length. Ambulated ~56f71fo AD, to stairs with CGA/min assist for balance. Ascended/descended 4 steps using B HRs with min assist for balance - reciprocal pattern on ascent and step-to pattern leading with L LE on descent - pt reports feeling weak - vitals assessed BP 147/88 (MAP 104), HR 82bpm, SpO2 100%. Ascended/descended 8 steps using B HRs with same pattern as above with CGA for steadying - denies weak feeling. Ambulated ~150ft51fk to room, no AD, with min assist for balance and cuing for improvement of the above gait impairments. Pt left sitting in recliner with needs in reach, chair alarm on, and B LEs elevated.  Therapy Documentation Precautions:  Precautions Precautions: Fall Precaution Comments: R inattention Restrictions Weight Bearing  Restrictions: No  Pain: Denies pain during session.   Therapy/Group: Individual Therapy  CarlyTawana Scale DPT 06/18/2019, 7:46 AM

## 2019-06-18 NOTE — Plan of Care (Signed)
  Problem: Consults Goal: [facility] BRAIN INJURY PATIENT EDUCATION Description: Description: See Patient Education module for eduction specifics 06/18/2019 0117 by Sandrea Hammond, LPN Outcome: Progressing 06/17/2019 2114 by Sandrea Hammond, LPN Outcome: Progressing Goal: Diabetes Guidelines if Diabetic/Glucose > 140 Description: If diabetic or lab glucose is > 140 mg/dl - Initiate Diabetes/Hyperglycemia Guidelines & Document Interventions  06/18/2019 0117 by Sandrea Hammond, LPN Outcome: Progressing 06/17/2019 2114 by Sandrea Hammond, LPN Outcome: Progressing   Problem: RH BOWEL ELIMINATION Goal: RH STG MANAGE BOWEL WITH ASSISTANCE Description: STG Manage Bowel with mod I Assistance. 06/18/2019 0117 by Sandrea Hammond, LPN Outcome: Progressing 06/17/2019 2114 by Sandrea Hammond, LPN Outcome: Progressing   Problem: RH SKIN INTEGRITY Goal: RH STG SKIN FREE OF INFECTION/BREAKDOWN 06/18/2019 0117 by Sandrea Hammond, LPN Outcome: Progressing 06/17/2019 2114 by Sandrea Hammond, LPN Outcome: Progressing   Problem: RH SAFETY Goal: RH STG ADHERE TO SAFETY PRECAUTIONS W/ASSISTANCE/DEVICE Description: STG Adhere to Safety Precautions With mod I Assistance/Device. 06/18/2019 0117 by Sandrea Hammond, LPN Outcome: Progressing 06/17/2019 2114 by Sandrea Hammond, LPN Outcome: Progressing   Problem: RH COGNITION-NURSING Goal: RH STG ANTICIPATES NEEDS/CALLS FOR ASSIST W/ASSIST/CUES Description: STG Anticipates Needs/Calls for Assist With mod I Assistance/Cues. 06/18/2019 0117 by Sandrea Hammond, LPN Outcome: Progressing 06/17/2019 2114 by Sandrea Hammond, LPN Outcome: Progressing   Problem: RH PAIN MANAGEMENT Goal: RH STG PAIN MANAGED AT OR BELOW PT'S PAIN GOAL Description: Less than 3 out of 10 06/18/2019 0117 by Sandrea Hammond, LPN Outcome: Progressing 06/17/2019 2114 by Sandrea Hammond, LPN Outcome: Progressing   Problem: RH KNOWLEDGE DEFICIT BRAIN INJURY Goal: RH STG INCREASE KNOWLEDGE OF SELF CARE AFTER BRAIN  INJURY Description: Patient able to demonstrate management of medications to control complications associated with hematoma/seizures 06/18/2019 0117 by Sandrea Hammond, LPN Outcome: Progressing 06/17/2019 2114 by Sandrea Hammond, LPN Outcome: Progressing

## 2019-06-18 NOTE — Progress Notes (Signed)
[facility] PHYSICAL MEDICINE & REHABILITATION PROGRESS NOTE   Subjective/Complaints:  Missed dinner due to HD yesterday    ROS- neg CP, SOB, N/V/D   Objective:   No results found. Recent Labs    06/16/19 1202 06/17/19 1338  WBC 6.3 7.5  HGB 9.8* 8.5*  HCT 26.1* RESULTS UNAVAILABLE DUE TO INTERFERING SUBSTANCE  PLT 291 249   Recent Labs    06/16/19 1202 06/17/19 1338  NA 137 136  K 4.5 4.6  CL 97* 96*  CO2 29 27  GLUCOSE 135* 151*  BUN 26* 43*  CREATININE 8.74* 11.32*  CALCIUM 8.6* 8.9    Intake/Output Summary (Last 24 hours) at 06/18/2019 0656 Last data filed at 06/17/2019 1724 Gross per 24 hour  Intake 480 ml  Output 2500 ml  Net -2020 ml     Physical Exam: Vital Signs Blood pressure (!) 102/56, pulse 73, temperature 98.8 F (37.1 C), temperature source Oral, resp. rate 18, height 6' (1.829 m), weight 84.9 kg, SpO2 97 %.   General: No acute distress Mood and affect are appropriate Heart: Regular rate and rhythm no rubs murmurs or extra sounds Lungs: Clear to auscultation, breathing unlabored, no rales or wheezes Abdomen: Positive bowel sounds, soft nontender to palpation, nondistended Extremities: No clubbing, cyanosis, or edema Skin: No evidence of breakdown, no evidence of rash Neurologic: Cranial nerves II through XII intact, motor strength is 5/5 in left deltoid, bicep, tricep, grip, hip flexor, knee extensors, ankle dorsiflexor and plantar flexor 4/5 RUE, 5/5/RLE Sensory exam normal sensation to light touch and proprioception in bilateral upper and lower extremities Cerebellar exam normal finger to nose to finger as well as heel to shin in bilateral upper and lower extremities Musculoskeletal: Full range of motion in all 4 extremities. No joint swelling   Assessment/Plan: 1. Functional deficits secondary to SDH which require 3+ hours per day of interdisciplinary therapy in a comprehensive inpatient rehab setting.  Physiatrist is providing close  team supervision and 24 hour management of active medical problems listed below.  Physiatrist and rehab team continue to assess barriers to discharge/monitor patient progress toward functional and medical goals  Care Tool:  Bathing    Body parts bathed by patient: Right arm, Left arm, Chest, Abdomen, Face   Body parts bathed by helper: Left lower leg, Right lower leg, Left upper leg, Right upper leg, Buttocks, Front perineal area, Abdomen, Chest, Left arm, Right arm Body parts n/a: Front perineal area, Buttocks, Right upper leg, Left lower leg, Right lower leg, Left upper leg(Pt declined completing secondary to stating it was done earlier)   Bathing assist Assist Level: Maximal Assistance - Patient 24 - 49%     Upper Body Dressing/Undressing Upper body dressing Upper body dressing/undressing activity did not occur (including orthotics): N/A What is the patient wearing?: Pull over shirt    Upper body assist Assist Level: Set up assist    Lower Body Dressing/Undressing Lower body dressing      What is the patient wearing?: Underwear/pull up, Pants     Lower body assist Assist for lower body dressing: Maximal Assistance - Patient 25 - 49%     Toileting Toileting    Toileting assist Assist for toileting: Minimal Assistance - Patient > 75%     Transfers Chair/bed transfer  Transfers assist     Chair/bed transfer assist level: Moderate Assistance - Patient 50 - 74%     Locomotion Ambulation   Ambulation assist      Assist level: Maximal  Assistance - Patient 25 - 49% Assistive device: No Device Max distance: 160f   Walk 10 feet activity   Assist     Assist level: Minimal Assistance - Patient > 75% Assistive device: No Device   Walk 50 feet activity   Assist    Assist level: Minimal Assistance - Patient > 75% Assistive device: No Device    Walk 150 feet activity   Assist Walk 150 feet activity did not occur: Safety/medical concerns(High fatigue  level)  Assist level: Minimal Assistance - Patient > 75% Assistive device: No Device    Walk 10 feet on uneven surface  activity   Assist     Assist level: Maximal Assistance - Patient 25 - 49%(mod A for ramp, max A when ambulating over mulch) Assistive device: Other (comment)(no AD)   Wheelchair     Assist Will patient use wheelchair at discharge?: No             Wheelchair 50 feet with 2 turns activity    Assist            Wheelchair 150 feet activity     Assist          Blood pressure (!) 102/56, pulse 73, temperature 98.8 F (37.1 C), temperature source Oral, resp. rate 18, height 6' (1.829 m), weight 84.9 kg, SpO2 97 %.    Medical Problem List and Plan: 1.  Impaired ADLs/mobility and function secondary to acute on chronic SDH with 5 mm of midline shift to left- with R hemiparesis and dysarthria- f/u with NSU after rehab; no head CT unless has a change in function, per NSU Strength and tone unchanged compared to my last office visit 09/18/2016 CIR PT, OT  2.  Antithrombotics: -DVT/anticoagulation:  Mechanical: Sequential compression devices, below knee Bilateral lower extremities and IVC filter             -antiplatelet therapy: N/A due to SDH 3. Pain Management:  Tylenol prn.  4. Mood: LCSW to follow for evaluation and support.              -antipsychotic agents: N/A 5. Neuropsych: This patient is not fully capable of making decisions on his own behalf. 6. Skin/Wound Care: Routine pressure relief measures.  7. Fluids/Electrolytes/Nutrition: Strict I/O. Labs per HD protocol.   9. Proteus bacteremia/Recent aspiration PNA: Treated with Zosyn and transitioned to cipro on 9/24-->? Duration.   10. Seizure disorder: On Vimpat and Lamictal bid.  Post HD on MWF needs additional doses of clonazepam 0.5 mg and Lamictal 150 mg to prevent breakthrough seizures.  11. T2DM: Monitor BS ac/hs. Continue 12. ESRD: Continue HD MWF at the end of the day to help  with tolerance of therapy.  13. HTN: Monitor BP tid--continue amlodipine and metoprolol bid. Mild systolic elevation will increase norvasc to 7.561mRunning lower will monitor on current meds , would appreciate Nephro input on BP  14. Aemia of chronic disease: H/H stable at  1558H/o A fib/PFO: Monitor HR tid--on metoprolol. Was on ASA only. No coumadin due to recurrent GIB and was on ASA--d/c due to SDH. Vitals:   06/17/19 2010 06/18/19 0505  BP: 124/76 (!) 102/56  Pulse: 78 73  Resp: 18 18  Temp: 97.7 F (36.5 C) 98.8 F (37.1 C)  SpO2: 100% 97%  HR Controlled 10/10 16. H/o SS thalassemia/PE/DVT: IVC filter placed 09/2015.  17. Spasticity in setting of 2017 CVA with previous R hemiparesis-  Chronic mainly Right index  18. Multiple myeloma-  was scheduled for autologous stem cell transplant at [facility]- will need f/u after rehab d/c. Labs also need to be monitored at least weekly for now. 19. B/L renal stones- discovered at [facility]- no acute effects currently- will determine if they are causing any renal issues- if not, will need intermittent monitoring.  20. OSA on CPAP- will order CPAP as required for nighttime.  LOS: 4 days A FACE TO FACE EVALUATION WAS PERFORMED  Charlett Blake 06/18/2019, 6:56 AM   HR controlled

## 2019-06-19 LAB — GLUCOSE, CAPILLARY
Glucose-Capillary: 119 mg/dL — ABNORMAL HIGH (ref 70–99)
Glucose-Capillary: 121 mg/dL — ABNORMAL HIGH (ref 70–99)
Glucose-Capillary: 157 mg/dL — ABNORMAL HIGH (ref 70–99)
Glucose-Capillary: 93 mg/dL (ref 70–99)

## 2019-06-19 MED ORDER — CHLORHEXIDINE GLUCONATE CLOTH 2 % EX PADS: 6.0000 | MEDICATED_PAD | Freq: Every day | CUTANEOUS | Status: DC

## 2019-06-19 MED ORDER — AMLODIPINE BESYLATE 5 MG PO TABS
7.5000 mg | ORAL_TABLET | Freq: Every day | ORAL | Status: DC
  Administered 2019-06-20 – 2019-06-24 (×5): 7.5 mg via ORAL
  Filled 2019-06-19 (×6): qty 1

## 2019-06-19 NOTE — Progress Notes (Addendum)
[facility] Progress Note   Subjective:   Patient seen and examined at bedside.  States he is feeling well. No specific complaints today. Denies SOB, CP, edema, n/v/d, and abdominal pain.   Objective Vitals:   06/18/19 1010 06/18/19 1630 06/18/19 2004 06/19/19 0558  BP: (!) 146/34 116/75 133/77 (!) 101/52  Pulse:  70 70 69  Resp:  _0 Temp:  98.3 F (36.8 C) 97.7 F (36.5 C) 98.7 F (37.1 C)  TempSrc:  Oral Oral Oral  SpO2:  100% 98% 97%  Weight:    86.6 kg  Height:       Physical Exam General:NAD, chronically ill appearing male, laying in bed Heart:RRR Lungs:CTAB Abdomen:soft, NTND Extremities:no LE edema Dialysis Access: RU AVF +b   Filed Weights   06/17/19 1724 06/18/19 0505 06/19/19 0558  Weight: 82.3 kg 84.9 kg 86.6 kg    Intake/Output Summary (Last 24 hours) at 06/19/2019 1334 Last data filed at 06/19/2019 0858 Gross per 24 hour  Intake 180 ml  Output -  Net 180 ml    Additional Objective Labs: Basic Metabolic Panel: Recent Labs  Lab 06/15/19 1357 06/16/19 1202 06/17/19 1338  NA 137 137 136  K 4.8 4.5 4.6  CL 97* 97* 96*  CO2 _1 GLUCOSE 145* 135* 151*  BUN 44* 26* 43*  CREATININE 11.18* 8.74* 11.32*  CALCIUM 8.7* 8.6* 8.9  PHOS 2.6 2.5 2.2*   Liver Function Tests: Recent Labs  Lab 06/15/19 1357 06/16/19 1202 06/17/19 1338  ALBUMIN 3.4* 3.5 3.4*   CBC: Recent Labs  Lab 06/13/19 0346  06/15/19 1357 06/16/19 1202 06/17/19 1338  WBC 5.8  --  7.0 6.3 7.5  HGB 9.0*   < > 9.0* 9.8* 8.5*  HCT RESULTS UNAVAILABLE DUE TO INTERFERING SUBSTANCE   < > RESULTS UNAVAILABLE DUE TO INTERFERING SUBSTANCE 26.1* RESULTS UNAVAILABLE DUE TO INTERFERING SUBSTANCE  MCV RESULTS UNAVAILABLE DUE TO INTERFERING SUBSTANCE  --  RESULTS UNAVAILABLE DUE TO INTERFERING SUBSTANCE 92.2 RESULTS UNAVAILABLE DUE TO INTERFERING SUBSTANCE  PLT 290  --  288 291 249   < > = values in this interval not displayed.   CBG: Recent Labs  Lab  06/18/19 1627 06/18/19 2128 06/18/19 2147 06/19/19 0625 06/19/19 1153  GLUCAP 128* 46* 101* 119* 121*   Iron Studies: No results for input(s): IRON, TIBC, TRANSFERRIN, FERRITIN in the last 72 hours. Lab Results  Component Value Date   INR 1.2 06/09/2019   INR 1.13 11/13/2016   INR 1.08 07/14/2016   PROTIME 16.8 (H) 10/30/2014   Studies/Results: No results found.  Medications: . sodium chloride    . sodium chloride     . acyclovir  400 mg Oral QHS  . amLODipine  7.5 mg Oral Daily  . calcium acetate  2,001 mg Oral TID WC  . calcium carbonate  2 tablet Oral QHS  . Chlorhexidine Gluconate Cloth  6 each Topical Q0600  . cinacalcet  60 mg Oral QPM  . ciprofloxacin  500 mg Oral QPM  . clonazePAM  0.5 mg Oral Q M,W,F-HD  . darbepoetin (ARANESP) injection - DIALYSIS  60 mcg Intravenous Q Fri-HD  . fluticasone furoate-vilanterol  1 puff Inhalation Daily  . insulin aspart  0-5 Units Subcutaneous QHS  . insulin aspart  0-9 Units Subcutaneous TID WC  . lacosamide  150 mg Oral BID  . lacosamide  150 mg Oral Q M,W,F-2000  . lamoTRIgine  150 mg Oral BID  . metoprolol  tartrate  12.5 mg Oral BID  . multivitamin  1 tablet Oral QHS  . pantoprazole  40 mg Oral Daily    Dialysis Orders: [facility] - MWF  4h 400/800 92kg 2/2 bathAVFHep 2600 (NO HEP here due to SDH) mircera 225 q2wks,  calcitriol 1.75 ug tiw venofer 50 /wk  Assessment/Plan: 1. AMS/acute on chronic SDH - per primary/neuro/NSurg - no surgical intervention needed. 2. Proteus bacteremia: on cipro. Has nephrolithiasis that needs to be addressed w/urology f/u.  3. ESRD - on HD MWF.  NO HEPARIN d/t subdural bleed.  Next HD 10/12. 4. Seizures - on 2 AED's. Gets clonazepam & extra Vimpat after each HD as well.  5. Anemia of CKD- Hgb 8.5. Aranesp 44mg started on 10/9, continue qwk.  Checking Fe studies. 6. Secondary hyperparathyroidism - Ca and phos at goal. Continue VDRA & binders.  7. HTN/volume - BP variable but  mostly well controlled, change amlodipine to bedtime. Continue 7.523mamlodipine qhs, lopressor 12.68m23mID.  Hold HTN meds prior to HD.  If BP continues to be soft, reduce amlodipine back to 68mg43m.  8. Nutrition - Renal diet w/fluid restrictions. 9. DM - on insulin.  10. Multiple myeloma - recent stem cell transplant planned but aborted d/t SDH & [facility]. Dispo - on CIR [facility] Pager: [phone] 931811/2020,1:34 PM  LOS: 5 days

## 2019-06-19 NOTE — Plan of Care (Signed)
  Problem: Consults Goal: RH BRAIN INJURY PATIENT EDUCATION Description: Description: See Patient Education module for eduction specifics Outcome: Progressing Goal: Diabetes Guidelines if Diabetic/Glucose > 140 Description: If diabetic or lab glucose is > 140 mg/dl - Initiate Diabetes/Hyperglycemia Guidelines & Document Interventions  Outcome: Progressing   Problem: RH BOWEL ELIMINATION Goal: RH STG MANAGE BOWEL WITH ASSISTANCE Description: STG Manage Bowel with mod I Assistance. Outcome: Progressing   Problem: RH SKIN INTEGRITY Goal: RH STG SKIN FREE OF INFECTION/BREAKDOWN Outcome: Progressing   Problem: RH SAFETY Goal: RH STG ADHERE TO SAFETY PRECAUTIONS W/ASSISTANCE/DEVICE Description: STG Adhere to Safety Precautions With mod I Assistance/Device. Outcome: Progressing   Problem: RH COGNITION-NURSING Goal: RH STG ANTICIPATES NEEDS/CALLS FOR ASSIST W/ASSIST/CUES Description: STG Anticipates Needs/Calls for Assist With mod I Assistance/Cues. Outcome: Progressing   Problem: RH PAIN MANAGEMENT Goal: RH STG PAIN MANAGED AT OR BELOW PT'S PAIN GOAL Description: Less than 3 out of 10 Outcome: Progressing   Problem: RH KNOWLEDGE DEFICIT BRAIN INJURY Goal: RH STG INCREASE KNOWLEDGE OF SELF CARE AFTER BRAIN INJURY Description: Patient able to demonstrate management of medications to control complications associated with hematoma/seizures Outcome: Progressing

## 2019-06-19 NOTE — Progress Notes (Signed)
[place] PHYSICAL MEDICINE & REHABILITATION PROGRESS NOTE   Subjective/Complaints:  No issues overnite except had hypoglycemia yesterday evening did not eat much supper ( did not like the meal)   ROS- neg CP, SOB, N/V/D   Objective:   No results found. Recent Labs    06/16/19 1202 06/17/19 1338  WBC 6.3 7.5  HGB 9.8* 8.5*  HCT 26.1* RESULTS UNAVAILABLE DUE TO INTERFERING SUBSTANCE  PLT 291 249   Recent Labs    06/16/19 1202 06/17/19 1338  NA 137 136  K 4.5 4.6  CL 97* 96*  CO2 29 27  GLUCOSE 135* 151*  BUN 26* 43*  CREATININE 8.74* 11.32*  CALCIUM 8.6* 8.9    Intake/Output Summary (Last 24 hours) at 06/19/2019 0731 Last data filed at 06/18/2019 1327 Gross per 24 hour  Intake 240 ml  Output -  Net 240 ml     Physical Exam: Vital Signs Blood pressure (!) 101/52, pulse 69, temperature 98.7 F (37.1 C), temperature source Oral, resp. rate 18, height 6' (1.829 m), weight 86.6 kg, SpO2 97 %.   General: No acute distress Mood and affect are appropriate Heart: Regular rate and rhythm no rubs murmurs or extra sounds Lungs: Clear to auscultation, breathing unlabored, no rales or wheezes Abdomen: Positive bowel sounds, soft nontender to palpation, nondistended Extremities: No clubbing, cyanosis, or edema Skin: No evidence of breakdown, no evidence of rash Neurologic: Cranial nerves II through XII intact, motor strength is 5/5 in left deltoid, bicep, tricep, grip, hip flexor, knee extensors, ankle dorsiflexor and plantar flexor 4/5 RUE, 5/5/RLE Sensory exam normal sensation to light touch and proprioception in bilateral upper and lower extremities Cerebellar exam normal finger to nose to finger as well as heel to shin in bilateral upper and lower extremities Musculoskeletal: Full range of motion in all 4 extremities. No joint swelling Tried cuff on left arm~2in too small   Assessment/Plan: 1. Functional deficits secondary to SDH which require 3+ hours per day of  interdisciplinary therapy in a comprehensive inpatient rehab setting.  Physiatrist is providing close team supervision and 24 hour management of active medical problems listed below.  Physiatrist and rehab team continue to assess barriers to discharge/monitor patient progress toward functional and medical goals  Care Tool:  Bathing    Body parts bathed by patient: Right arm, Left arm, Chest, Abdomen, Face   Body parts bathed by helper: Left lower leg, Right lower leg, Left upper leg, Right upper leg, Buttocks, Front perineal area, Abdomen, Chest, Left arm, Right arm Body parts n/a: Front perineal area, Buttocks, Right upper leg, Left lower leg, Right lower leg, Left upper leg(Pt declined completing secondary to stating it was done earlier)   Bathing assist Assist Level: Maximal Assistance - Patient 24 - 49%     Upper Body Dressing/Undressing Upper body dressing Upper body dressing/undressing activity did not occur (including orthotics): N/A What is the patient wearing?: Pull over shirt    Upper body assist Assist Level: Set up assist    Lower Body Dressing/Undressing Lower body dressing      What is the patient wearing?: Underwear/pull up, Pants     Lower body assist Assist for lower body dressing: Maximal Assistance - Patient 25 - 49%     Toileting Toileting    Toileting assist Assist for toileting: Minimal Assistance - Patient > 75%     Transfers Chair/bed transfer  Transfers assist     Chair/bed transfer assist level: Minimal Assistance - Patient > 75%  Locomotion Ambulation   Ambulation assist      Assist level: Maximal Assistance - Patient 25 - 49% Assistive device: No Device Max distance: 150   Walk 10 feet activity   Assist     Assist level: Minimal Assistance - Patient > 75% Assistive device: No Device   Walk 50 feet activity   Assist    Assist level: Minimal Assistance - Patient > 75% Assistive device: No Device    Walk 150  feet activity   Assist Walk 150 feet activity did not occur: Safety/medical concerns(High fatigue level)  Assist level: Minimal Assistance - Patient > 75% Assistive device: No Device    Walk 10 feet on uneven surface  activity   Assist     Assist level: Maximal Assistance - Patient 25 - 49%(mod A for ramp, max A when ambulating over mulch) Assistive device: Other (comment)(no AD)   Wheelchair     Assist Will patient use wheelchair at discharge?: No             Wheelchair 50 feet with 2 turns activity    Assist            Wheelchair 150 feet activity     Assist          Blood pressure (!) 101/52, pulse 69, temperature 98.7 F (37.1 C), temperature source Oral, resp. rate 18, height 6' (1.829 m), weight 86.6 kg, SpO2 97 %.    Medical Problem List and Plan: 1.  Impaired ADLs/mobility and function secondary to acute on chronic SDH with 5 mm of midline shift to left- with R hemiparesis and dysarthria- f/u with NSU after rehab; no head CT unless has a change in function, per NSU Strength and tone unchanged compared to my last office visit 09/18/2016 CIR PT, OT  2.  Antithrombotics: -DVT/anticoagulation:  Mechanical: Sequential compression devices, below knee Bilateral lower extremities and IVC filter             -antiplatelet therapy: N/A due to SDH 3. Pain Management:  Tylenol prn.  4. Mood: LCSW to follow for evaluation and support.              -antipsychotic agents: N/A 5. Neuropsych: This patient is not fully capable of making decisions on his own behalf. 6. Skin/Wound Care: Routine pressure relief measures.  7. Fluids/Electrolytes/Nutrition: Strict I/O. Labs per HD protocol.   9. Proteus bacteremia/Recent aspiration PNA: Treated with Zosyn and transitioned to cipro on 9/24-->? Duration.   10. Seizure disorder: On Vimpat and Lamictal bid.  Post HD on MWF needs additional doses of clonazepam 0.5 mg and Lamictal 150 mg to prevent breakthrough  seizures.  11. T2DM: Monitor BS ac/hs. Continue CBG (last 3)  Recent Labs    06/18/19 2128 06/18/19 2147 06/19/19 0625  GLUCAP 46* 101* 119*  pt now knows to request snack if not eating meal to avoid hypoglycemia  12. ESRD: Continue HD MWF at the end of the day to help with tolerance of therapy.  13. HTN: Monitor BP tid--continue amlodipine and metoprolol bid. Mild systolic elevation will increase norvasc to 7.81m Running lower will monitor on current meds , would appreciate Nephro input on BP  14. Aemia of chronic disease: H/H stable at  124 H/o A fib/PFO: Monitor HR tid--on metoprolol. Was on ASA only. No coumadin due to recurrent GIB and was on ASA--d/c due to SDH. Vitals:   06/18/19 2004 06/19/19 0558  BP: 133/77 (!) 101/52  Pulse: 70 69  Resp:  19 18  Temp: 97.7 F (36.5 C) 98.7 F (37.1 C)  SpO2: 98% 97%  HR Controlled 10/11 16. H/o SS thalassemia/PE/DVT: IVC filter placed 09/2015.  17. Spasticity in setting of 2017 CVA with previous R hemiparesis-  Chronic mainly Right index no need for Botox , cont OT   18. Multiple myeloma- was scheduled for autologous stem cell transplant at [facility]- will need f/u after rehab d/c. Labs also need to be monitored at least weekly for now. 19. B/L renal stones- discovered at [facility]- no acute effects currently- will determine if they are causing any renal issues- if not, will need intermittent monitoring.  20. OSA on CPAP- will order CPAP as required for nighttime.  LOS: 5 days A FACE TO FACE EVALUATION WAS PERFORMED  Charlett Blake 06/19/2019, 7:31 AM   HR controlled

## 2019-06-20 ENCOUNTER — Inpatient Hospital Stay (HOSPITAL_COMMUNITY): Payer: Medicare Other

## 2019-06-20 ENCOUNTER — Inpatient Hospital Stay (HOSPITAL_COMMUNITY): Payer: 59 | Admitting: Occupational Therapy

## 2019-06-20 ENCOUNTER — Inpatient Hospital Stay (HOSPITAL_COMMUNITY): Payer: 59

## 2019-06-20 DIAGNOSIS — R569 Unspecified convulsions: Secondary | ICD-10-CM

## 2019-06-20 DIAGNOSIS — E1165 Type 2 diabetes mellitus with hyperglycemia: Secondary | ICD-10-CM

## 2019-06-20 DIAGNOSIS — N186 End stage renal disease: Secondary | ICD-10-CM

## 2019-06-20 DIAGNOSIS — R7309 Other abnormal glucose: Secondary | ICD-10-CM

## 2019-06-20 DIAGNOSIS — D638 Anemia in other chronic diseases classified elsewhere: Secondary | ICD-10-CM

## 2019-06-20 DIAGNOSIS — Z992 Dependence on renal dialysis: Secondary | ICD-10-CM

## 2019-06-20 DIAGNOSIS — I10 Essential (primary) hypertension: Secondary | ICD-10-CM

## 2019-06-20 LAB — GLUCOSE, CAPILLARY
Glucose-Capillary: 73 mg/dL (ref 70–99)
Glucose-Capillary: 130 mg/dL — ABNORMAL HIGH (ref 70–99)
Glucose-Capillary: 105 mg/dL — ABNORMAL HIGH (ref 70–99)
Glucose-Capillary: 89 mg/dL (ref 70–99)

## 2019-06-20 LAB — CBC
HCT: 22.7 % — ABNORMAL LOW (ref 39.0–52.0)
Hemoglobin: 8.5 g/dL — ABNORMAL LOW (ref 13.0–17.0)
MCH: 33.1 pg (ref 26.0–34.0)
MCHC: 37.4 g/dL — ABNORMAL HIGH (ref 30.0–36.0)
MCV: 88.3 fL (ref 80.0–100.0)
Platelets: 215 10*3/uL (ref 150–400)
RBC: 2.57 MIL/uL — ABNORMAL LOW (ref 4.22–5.81)
RDW: 15.8 % — ABNORMAL HIGH (ref 11.5–15.5)
WBC: 8.7 10*3/uL (ref 4.0–10.5)
nRBC: 0.2 % (ref 0.0–0.2)

## 2019-06-20 LAB — RENAL FUNCTION PANEL
Albumin: 3.5 g/dL (ref 3.5–5.0)
Anion gap: 15 (ref 5–15)
BUN: 60 mg/dL — ABNORMAL HIGH (ref 6–20)
CO2: 25 mmol/L (ref 22–32)
Calcium: 8.9 mg/dL (ref 8.9–10.3)
Chloride: 94 mmol/L — ABNORMAL LOW (ref 98–111)
Creatinine, Ser: 14.01 mg/dL — ABNORMAL HIGH (ref 0.61–1.24)
GFR calc Af Amer: 4 mL/min — ABNORMAL LOW (ref >60)
GFR calc non Af Amer: 3 mL/min — ABNORMAL LOW (ref >60)
Glucose, Bld: 116 mg/dL — ABNORMAL HIGH (ref 70–99)
Phosphorus: 1.8 mg/dL — ABNORMAL LOW (ref 2.5–4.6)
Potassium: 4.5 mmol/L (ref 3.5–5.1)
Sodium: 134 mmol/L — ABNORMAL LOW (ref 135–145)

## 2019-06-20 MED ORDER — SODIUM CHLORIDE 0.9 % IV SOLN: 100.0000 mL | INTRAVENOUS | Status: DC | PRN

## 2019-06-20 MED ORDER — ALTEPLASE 2 MG IJ SOLR: 2.0000 mg | Freq: Once | INTRAMUSCULAR | Status: DC | PRN

## 2019-06-20 MED ORDER — ACETAMINOPHEN 325 MG PO TABS: 325.0000 mg | ORAL_TABLET | ORAL | Status: AC | PRN

## 2019-06-20 MED ORDER — LIDOCAINE HCL (PF) 1 % IJ SOLN: 5.0000 mL | INTRAMUSCULAR | Status: DC | PRN

## 2019-06-20 MED ORDER — HEPARIN SODIUM (PORCINE) 1000 UNIT/ML DIALYSIS: 1000.0000 [IU] | INTRAMUSCULAR | Status: DC | PRN

## 2019-06-20 MED ORDER — PENTAFLUOROPROP-TETRAFLUOROETH EX AERO: 1.0000 "application " | INHALATION_SPRAY | CUTANEOUS | Status: DC | PRN

## 2019-06-20 MED ORDER — LIDOCAINE-PRILOCAINE 2.5-2.5 % EX CREA: 1.0000 "application " | TOPICAL_CREAM | CUTANEOUS | Status: DC | PRN

## 2019-06-20 NOTE — Progress Notes (Signed)
Occupational Therapy Session Note  Patient Details  Name: Isaiah [name] MRN: [id number] Date of Birth: 02/16/1966  Today's Date: 06/20/2019 OT Individual Time: 1110-1205 OT Individual Time Calculation (min): 55 min    Short Term Goals: Week 1:  OT Short Term Goal 1 (Week 1): STGs=LTGs secondary to short LOS  Skilled Therapeutic Interventions/Progress Updates:    Pt in recliner to start session stating that he didn't feel good.  He needed max questioning cueing to state that he felt nauseas and also reported a headache earlier but not at this point.  He agreed to participate in therapy in the room but did not want to go out of the room.  Focused session on static and dynamic standing balance.  With initial sit to stand and throughout session pt demonstrated severe increased forward lean in standing.  Max assist needed to keep him from falling forward with and without use of the RW for support.  He was not able to self correct at all when standing.  Had pt work on standing with his back to the wall in order to assist with upright posture.  He could maintain standing with min assist with this position, but once he stepped away from the walker he would again demonstrate the severe forward lean.  Pt's spouse present for part of session as well and noted pt's increased forward LOB.  Nursing also made aware of pt's current balance issues.  Pt transferred back to the bed with max assist.  Pt left with NT and nursing.    Therapy Documentation Precautions:  Precautions Precautions: Fall Precaution Comments: R inattention Restrictions Weight Bearing Restrictions: No   Pain: Pain Assessment Pain Scale: Faces Pain Score: 0-No pain ADL: See Care Tool Section for some details of mobility  Therapy/Group: Individual Therapy  Davinci Glotfelty OTR/L 06/20/2019, 12:51 PM

## 2019-06-20 NOTE — Plan of Care (Signed)
  Problem: Consults Goal: RH BRAIN INJURY PATIENT EDUCATION Description: Description: See Patient Education module for eduction specifics Outcome: Progressing Goal: Diabetes Guidelines if Diabetic/Glucose > 140 Description: If diabetic or lab glucose is > 140 mg/dl - Initiate Diabetes/Hyperglycemia Guidelines & Document Interventions  Outcome: Progressing   Problem: RH BOWEL ELIMINATION Goal: RH STG MANAGE BOWEL WITH ASSISTANCE Description: STG Manage Bowel with mod I Assistance. Outcome: Progressing   Problem: RH SKIN INTEGRITY Goal: RH STG SKIN FREE OF INFECTION/BREAKDOWN Outcome: Progressing   Problem: RH SAFETY Goal: RH STG ADHERE TO SAFETY PRECAUTIONS W/ASSISTANCE/DEVICE Description: STG Adhere to Safety Precautions With mod I Assistance/Device. Outcome: Progressing   Problem: RH COGNITION-NURSING Goal: RH STG ANTICIPATES NEEDS/CALLS FOR ASSIST W/ASSIST/CUES Description: STG Anticipates Needs/Calls for Assist With mod I Assistance/Cues. Outcome: Progressing   Problem: RH PAIN MANAGEMENT Goal: RH STG PAIN MANAGED AT OR BELOW PT'S PAIN GOAL Description: Less than 3 out of 10 Outcome: Progressing   Problem: RH KNOWLEDGE DEFICIT BRAIN INJURY Goal: RH STG INCREASE KNOWLEDGE OF SELF CARE AFTER BRAIN INJURY Description: Patient able to demonstrate management of medications to control complications associated with hematoma/seizures Outcome: Progressing

## 2019-06-20 NOTE — Progress Notes (Signed)
PHYSICAL MEDICINE & REHABILITATION PROGRESS NOTE   Subjective/Complaints: Patient seen working with therapy this morning.  He states he slept well overnight.  He states he had a good weekend.  He was seen by nephrology yesterday, notes reviewed-checking iron studies.  ROS: Denies CP, SOB, N/V/D  Objective: No results found. Recent Labs    06/17/19 1338  WBC 7.5  HGB 8.5*  HCT RESULTS UNAVAILABLE DUE TO INTERFERING SUBSTANCE  PLT 249   Recent Labs    06/17/19 1338  NA 136  K 4.6  CL 96*  CO2 27  GLUCOSE 151*  BUN 43*  CREATININE 11.32*  CALCIUM 8.9    Intake/Output Summary (Last 24 hours) at 06/20/2019 1123 Last data filed at 06/20/2019 0749 Gross per 24 hour  Intake 500 ml  Output -  Net 500 ml     Physical Exam: Vital Signs Blood pressure 107/63, pulse 73, temperature 99.2 F (37.3 C), temperature source Oral, resp. rate 20, height 6' (1.829 m), weight 86.6 kg, SpO2 100 %. Constitutional: No distress . Vital signs reviewed. HENT: Normocephalic.  Atraumatic. Eyes: EOMI. No discharge. Cardiovascular: No JVD. Respiratory: Normal effort.  No stridor. GI: Non-distended. Skin: Warm and dry.  Intact. Psych: Normal mood.  Normal behavior. Musc: No edema in extremities.  No tenderness in extremities. Neurologic: Alert Dysarthria Patient weakness Motor: LUE/LLE: 5/5 proximal distal RUE: 4/5 proximal distal RLE: 4+/5 proximal distal    Assessment/Plan: 1. Functional deficits secondary to SDH which require 3+ hours per day of interdisciplinary therapy in a comprehensive inpatient rehab setting.  Physiatrist is providing close team supervision and 24 hour management of active medical problems listed below.  Physiatrist and rehab team continue to assess barriers to discharge/monitor patient progress toward functional and medical goals  Care Tool:  Bathing    Body parts bathed by patient: Right arm, Left arm, Chest, Abdomen, Face   Body parts  bathed by helper: Left lower leg, Right lower leg, Left upper leg, Right upper leg, Buttocks, Front perineal area, Abdomen, Chest, Left arm, Right arm Body parts n/a: Front perineal area, Buttocks, Right upper leg, Left lower leg, Right lower leg, Left upper leg(Pt declined completing secondary to stating it was done earlier)   Bathing assist Assist Level: Maximal Assistance - Patient 24 - 49%     Upper Body Dressing/Undressing Upper body dressing Upper body dressing/undressing activity did not occur (including orthotics): N/A What is the patient wearing?: Pull over shirt    Upper body assist Assist Level: Set up assist    Lower Body Dressing/Undressing Lower body dressing      What is the patient wearing?: Underwear/pull up, Pants     Lower body assist Assist for lower body dressing: Maximal Assistance - Patient 25 - 49%     Toileting Toileting    Toileting assist Assist for toileting: Minimal Assistance - Patient > 75%     Transfers Chair/bed transfer  Transfers assist     Chair/bed transfer assist level: Minimal Assistance - Patient > 75%     Locomotion Ambulation   Ambulation assist      Assist level: Moderate Assistance - Patient 50 - 74% Assistive device: No Device Max distance: 160f   Walk 10 feet activity   Assist     Assist level: Moderate Assistance - Patient - 50 - 74% Assistive device: No Device   Walk 50 feet activity   Assist    Assist level: Moderate Assistance - Patient - 50 - 74% Assistive  device: No Device    Walk 150 feet activity   Assist Walk 150 feet activity did not occur: Safety/medical concerns(High fatigue level)  Assist level: Moderate Assistance - Patient - 50 - 74% Assistive device: No Device    Walk 10 feet on uneven surface  activity   Assist     Assist level: Maximal Assistance - Patient 25 - 49%(mod A for ramp, max A when ambulating over mulch) Assistive device: Other (comment)(no AD)    Wheelchair     Assist Will patient use wheelchair at discharge?: No             Wheelchair 50 feet with 2 turns activity    Assist            Wheelchair 150 feet activity     Assist          Blood pressure 107/63, pulse 73, temperature 99.2 F (37.3 C), temperature source Oral, resp. rate 20, height 6' (1.829 m), weight 86.6 kg, SpO2 100 %.    Medical Problem List and Plan: 1.  Impaired ADLs/mobility and function secondary to acute on chronic SDH with 5 mm of midline shift to left- with R hemiparesis and dysarthria- f/u with NSU after rehab; no head CT unless has a change in function, per NSU  Continue CIR 2.  Antithrombotics: -DVT/anticoagulation:  Mechanical: Sequential compression devices, below knee Bilateral lower extremities and IVC filter             -antiplatelet therapy: N/A due to SDH 3. Pain Management:  Tylenol prn.  4. Mood: LCSW to follow for evaluation and support.              -antipsychotic agents: N/A 5. Neuropsych: This patient is not fully capable of making decisions on his own behalf. 6. Skin/Wound Care: Routine pressure relief measures.  7. Fluids/Electrolytes/Nutrition: Strict I/O. Labs per HD protocol.   9. Proteus bacteremia/Recent aspiration PNA: Treated with Zosyn and transitioned to cipro on 9/24 10. Seizure disorder: On Vimpat and Lamictal bid.  Post HD on MWF needs additional doses of clonazepam 0.5 mg and Lamictal 150 mg to prevent breakthrough seizures.   No seizures since admission to [facility] 11. T2DM with hyperglycemia: Monitor BS ac/hs. Continue CBG (last 3)  Recent Labs    06/19/19 1652 06/19/19 2111 06/20/19 0631  GLUCAP 157* 93 73   Labile?  Trending down on 10/12 despite eating 100% of meals 12. ESRD: Continue hemodialysis MWF at the end of the day to help with tolerance of therapy.  13. HTN: Monitor BP tid  Amlodipine increased to 7.5 daily  Continue metoprolol bid.   Relatively controlled on 10/12 14.  Anemia  of chronic disease:  Hemoglobin 8.5 on 10/9 15. H/o A fib/PFO: Monitor HR tid--on metoprolol. Was on ASA only. No coumadin due to recurrent GIB and was on ASA--d/c due to SDH. Vitals:   06/19/19 1924 06/20/19 0430  BP: 125/63 107/63  Pulse: 66 73  Resp: 18 20  Temp: 97.8 F (36.6 C) 99.2 F (37.3 C)  SpO2: 100% 100%   Heart rate controlled on 10/12 16. H/o SS thalassemia/PE/DVT: IVC filter placed 09/2015.  17. 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 Multiple myeloma- was scheduled for autologous stem cell transplant at [facility]- will need f/u after rehab d/c. Labs also need to be monitored at least weekly for now. 19. B/L renal  stones-Intermittent monitoring.  20. OSA on CPAP- CPAP as required for nighttime.  LOS: 6 days A FACE TO FACE EVALUATION WAS PERFORMED  Ankit Lorie Phenix 06/20/2019, 11:23 AM   HR controlled

## 2019-06-20 NOTE — Progress Notes (Signed)
Patient transported to HD.  Report previously called to HD nurse.  Patient stable at time of transfer.  Brita Romp, RN

## 2019-06-20 NOTE — Progress Notes (Signed)
Physical Therapy Session Note  Patient Details  Name: Isaiah [name] MRN: [id number] Date of Birth: Dec 04, 1965  Today's Date: 06/20/2019 PT Individual Time: 0800-0900 PT Individual Time Calculation (min): 60 min   Short Term Goals: Week 1:  PT Short Term Goal 1 (Week 1): =LTG due to ELOS  Skilled Therapeutic Interventions/Progress Updates:    Received pt supine in bed. Pt agreeable to PT and denied any pain during today's session. Today's session focused on functional mobility, ambulation, stair navigation, LE strength, AD education, balance/coordination, increased use of RUE, and improved tolerance to activity. At beginning of session, pt stated he would rather use single point cane rather than RW when he D/C home. After ambulating with cane pt agreed to using RW at home for safety. Pt performed bed mobility with supervision with HOB elevated. Pt ambulated 36f with no AD min A to sink. Pt brushed teeth and washed face with supervision/CGA and verbal cues for toothbrush management  Pt ambulated 332fwith no AD min/mod A for anterior trunk lean. Pt ambulated [id number] therapy gym with single point cane min A. Pt navigated 12 steps with 2 handrails CGA ascending with a reciprocal pattern and descending with step to pattern. Pt performed standing dynamic balance consisting of matching cards on velcro board using RUE. Pt required min A for finding correct card match. Pt ambulated [id number] no AD back to room min/mod A. Pt continues to requires cues for increased RLE step length, to correct anterior lean, and to slow pace. Concluded session with pt sitting in recliner, needs within reach, and chair pad alarm on.   Therapy Documentation Precautions:  Precautions Precautions: Fall Precaution Comments: R inattention Restrictions Weight Bearing Restrictions: No   Therapy/Group: Individual Therapy  AnnaAlfonse Alpers DPT   06/20/2019, 7:39 AM

## 2019-06-20 NOTE — Progress Notes (Signed)
Speech Language Pathology Daily Session Note  Patient Details  Name: Tacari Repass [facility] MRN: [id number] Date of Birth: [birth year]  Today's Date: 06/20/2019 SLP Individual Time: 0905-1000 SLP Individual Time Calculation (min): 55 min  Short Term Goals: Week 1: SLP Short Term Goal 1 (Week 1): Pt will recall novel, daily information with compensatory aid with mod A verbal cues. SLP Short Term Goal 2 (Week 1): Pt will demonstrate sustained attention to functional task in 30 minute intervals with supervision A verbal cues for redirection. SLP Short Term Goal 3 (Week 1): Pt will complete basic functional problem solving tasks (ex: ADLs) with min A verbal cues. SLP Short Term Goal 4 (Week 1): Pt will self-monitor and self-correct functional errors in problem solving tasks with mod A verbal cues. SLP Short Term Goal 5 (Week 1): Pt will demonstrate use of speech intelligibility strategies at sentence level for 85% intelligibility with supervision A verbal cues to slow rate and correct articulation errors. SLP Short Term Goal 6 (Week 1): Pt will complete higher level word finding tasks with min A verbal cues for use of strategies.  Skilled Therapeutic Interventions:  Skilled ST services focused on cognitive skills. SLP attempted to facilitate use of memory notebook to aid in recall, however pt was unable to read consistently given multiple sizes of print and with various glasses options, due to vision deficits ( pt stated issue with cataracts.) Pt demonstrated recall of today's PT events and x1 safety precautions during ambulation (stay within walker.) SLP facilitated problem solving skills given verbal sequencing tasks (ex: cooking eggs), pt required mod A verbal cues for though organization and for problem solving. Pt required min A verbal cues for speech intelligibility (slow rate and articulation errors) with 75% intelligibility at sentence level with little awareness of errors. Pt was left in room with call  bell within reach and chair alarm set. ST recommends to continue skilled ST services.      Pain Pain Assessment Pain Scale: Faces Pain Score: 0-No pain  Therapy/Group: Individual Therapy  Rollyn Scialdone  [facility] 06/20/2019, 3:26 PM

## 2019-06-20 NOTE — Progress Notes (Signed)
Patient was working with therapy and had a sudden decrease in mobility. He was min assist this morning but Jeneen Rinks transferred him at max assist just now. He also complains of slight blurred vision. He was also complaining of nausea earlier (not sure if it could be related or not). VSS. CBG 130. Seems stable now, going to HD shortly after lunch.  Will continue to monitor.  Brita Romp, RN

## 2019-06-20 NOTE — Progress Notes (Signed)
[facility] ROUNDING NOTE   Subjective:   This is a 53 year old gentleman with a history of diabetes end-stage renal disease Monday Wednesday Friday dialysis atrial fibrillation history of CVA 2017 history of thalassemia history of DVT/pulmonary embolus negative coagulopathy with IVC filter since 2017, history GIB, history of seizure disorders, history of multiple myeloma with recent admission to [name] 05/26/2019 for stem cell transplant procedure was held due to findings of Proteus bacteremia.  He was also found to have aspiration pneumonia and acute on chronic subdural hemorrhage with bilateral renal stones also found.  He had progressive weakness slurred speech worsening right-sided facial weakness and droop he was found to have a CT scan of the head that showed 1.5 cm acute on chronic subdural hemorrhage with 4 mm midline shift.  Dr. Ellene Route was consulted and recommended conservative therapy.  He was also seen by neurology Dr. Leonel Ramsay who felt this was a low suspicion for neurological cause mild worsening right-sided weakness and follow-up CT scan of head 10 4 showed stable right subdural hemorrhage with stable mass-effect.  Mentation has continued to improve during hospitalization.  Blood pressure 107/63 pulse 73 temperature 99.3 O2 sats and percent room air  Sodium 136 potassium 4.6 chloride 96 CO2 27 BUN 43 creatinine 11 glucose 151 calcium 8.9 phosphorus 2.2 albumin 3.4 WBC 7.5 hemoglobin 8.5 platelets 249  Acyclovir 400 mg nightly, amlodipine 7.5 mg nightly, PhosLo 3 tablets 3 times daily, Tums 400 mg 2 tablets at bedtime, Sensipar 60 mg daily, Cipro 500 mg daily, darbepoetin 60 mcg last administered 06/17/2019, Klonopin 0.5 mg Monday Wednesday Friday, insulin sliding scale, Vimpat 250 mg Monday Wednesday Friday, Lamictal 150 mg twice daily, metoprolol 12.5 mg twice daily, multivitamins 1 daily Protonix 40 mg daily   Objective:  Vital signs in last 24 hours:  Temp:   [97.8 F (36.6 C)-99.2 F (37.3 C)] 99.2 F (37.3 C) (10/12 0430) Pulse Rate:  [64-73] 73 (10/12 0430) Resp:  [18-20] 20 (10/12 0430) BP: (107-125)/(63-71) 107/63 (10/12 0430) SpO2:  [98 %-100 %] 100 % (10/12 0430)  Weight change:  Filed Weights   06/17/19 1724 06/18/19 0505 06/19/19 0558  Weight: 82.3 kg 84.9 kg 86.6 kg    Intake/Output: I/O last 3 completed shifts: In: 480 [P.O.:480] Out: -    Intake/Output this shift:  No intake/output data recorded.  General:NAD, WNWD male, sitting in bedside chair Heart:RRR, no mrg Lungs:CTAB Abdomen:soft, NTND Extremities:no LE edema Dialysis Access: R AVF +b/t   Basic Metabolic Panel: Recent Labs  Lab 06/14/19 0247 06/15/19 1357 06/16/19 1202 06/17/19 1338  NA 136 137 137 136  K 4.2 4.8 4.5 4.6  CL 98 97* 97* 96*  CO2 28 26 29 27   GLUCOSE 107* 145* 135* 151*  BUN 23* 44* 26* 43*  CREATININE 7.50* 11.18* 8.74* 11.32*  CALCIUM 8.1* 8.7* 8.6* 8.9  PHOS 2.6 2.6 2.5 2.2*    Liver Function Tests: Recent Labs  Lab 06/14/19 0247 06/15/19 1357 06/16/19 1202 06/17/19 1338  ALBUMIN 3.0* 3.4* 3.5 3.4*   No results for input(s): LIPASE, AMYLASE in the last 168 hours. No results for input(s): AMMONIA in the last 168 hours.  CBC: Recent Labs  Lab 06/14/19 0247 06/15/19 1357 06/16/19 1202 06/17/19 1338  WBC  --  7.0 6.3 7.5  HGB 8.9* 9.0* 9.8* 8.5*  HCT RESULTS UNAVAILABLE DUE TO INTERFERING SUBSTANCE RESULTS UNAVAILABLE DUE TO INTERFERING SUBSTANCE 26.1* RESULTS UNAVAILABLE DUE TO INTERFERING SUBSTANCE  MCV  --  RESULTS UNAVAILABLE DUE TO  INTERFERING SUBSTANCE 92.2 RESULTS UNAVAILABLE DUE TO INTERFERING SUBSTANCE  PLT  --  288 291 249    Cardiac Enzymes: No results for input(s): CKTOTAL, CKMB, CKMBINDEX, TROPONINI in the last 168 hours.  BNP: Invalid input(s): POCBNP  CBG: Recent Labs  Lab 06/19/19 0625 06/19/19 1153 06/19/19 1652 06/19/19 2111 06/20/19 0631  GLUCAP 119* 121* 157* 93 73     Microbiology: Results for orders placed or performed during the hospital encounter of 06/09/19  SARS Coronavirus 2 [facility] order, Performed in [facility] hospital lab) Nasopharyngeal Nasopharyngeal Swab     Status: None   Collection Time: 06/09/19 10:47 AM   Specimen: Nasopharyngeal Swab  Result Value Ref Range Status   SARS Coronavirus 2 NEGATIVE NEGATIVE Final    Comment: (NOTE) If result is NEGATIVE SARS-CoV-2 target nucleic acids are NOT DETECTED. The SARS-CoV-2 RNA is generally detectable in upper and lower  respiratory specimens during the acute phase of infection. The lowest  concentration of SARS-CoV-2 viral copies this assay can detect is 250  copies / mL. A negative result does not preclude SARS-CoV-2 infection  and should not be used as the sole basis for treatment or other  patient management decisions.  A negative result may occur with  improper specimen collection / handling, submission of specimen other  than nasopharyngeal swab, presence of viral mutation(s) within the  areas targeted by this assay, and inadequate number of viral copies  (<250 copies / mL). A negative result must be combined with clinical  observations, patient history, and epidemiological information. If result is POSITIVE SARS-CoV-2 target nucleic acids are DETECTED. The SARS-CoV-2 RNA is generally detectable in upper and lower  respiratory specimens dur ing the acute phase of infection.  Positive  results are indicative of active infection with SARS-CoV-2.  Clinical  correlation with patient history and other diagnostic information is  necessary to determine patient infection status.  Positive results do  not rule out bacterial infection or co-infection with other viruses. If result is PRESUMPTIVE POSTIVE SARS-CoV-2 nucleic acids MAY BE PRESENT.   A presumptive positive result was obtained on the submitted specimen  and confirmed on repeat testing.  While 2019 novel coronavirus   (SARS-CoV-2) nucleic acids may be present in the submitted sample  additional confirmatory testing may be necessary for epidemiological  and / or clinical management purposes  to differentiate between  SARS-CoV-2 and other Sarbecovirus currently known to infect humans.  If clinically indicated additional testing with an alternate test  methodology [phone]) is advised. The SARS-CoV-2 RNA is generally  detectable in upper and lower respiratory sp ecimens during the acute  phase of infection. The expected result is Negative. Fact Sheet for Patients:  StrictlyIdeas.no Fact Sheet for Healthcare Providers: BankingDealers.co.za This test is not yet approved or cleared by the [place] FDA and has been authorized for detection and/or diagnosis of SARS-CoV-2 by FDA under an Emergency Use Authorization (EUA).  This EUA will remain in effect (meaning this test can be used) for the duration of the COVID-19 declaration under Section 564(b)(1) of the Act, 21 U.S.C. section 360bbb-3(b)(1), unless the authorization is terminated or revoked sooner. Performed at [facility], [place] [address], [place], [place] [address]   Blood culture (routine x 2)     Status: None   Collection Time: 06/09/19 10:47 AM   Specimen: BLOOD  Result Value Ref Range Status   Specimen Description BLOOD UNKNOWN  Final   Special Requests   Final    BOTTLES  DRAWN AEROBIC AND ANAEROBIC Blood Culture results may not be optimal due to an inadequate volume of blood received in culture bottles   Culture   Final    NO GROWTH 5 DAYS Performed at [facility], [place] [address] [place], [place] [address]    Report Status 06/14/2019 FINAL  Final  Blood culture (routine x 2)     Status: None   Collection Time: 06/09/19  2:22 PM   Specimen: BLOOD  Result Value Ref Range Status   Specimen Description BLOOD LEFT ANTECUBITAL  Final   Special Requests   Final    BOTTLES  DRAWN AEROBIC ONLY Blood Culture results may not be optimal due to an inadequate volume of blood received in culture bottles   Culture   Final    NO GROWTH 5 DAYS Performed at [facility], [place] [address] [place], [place] [address]    Report Status 06/14/2019 FINAL  Final    Coagulation Studies: No results for input(s): LABPROT, INR in the last 72 hours.  Urinalysis: No results for input(s): COLORURINE, LABSPEC, PHURINE, GLUCOSEU, HGBUR, BILIRUBINUR, KETONESUR, PROTEINUR, UROBILINOGEN, NITRITE, LEUKOCYTESUR in the last 72 hours.  Invalid input(s): APPERANCEUR    Imaging: No results found.   Medications:   . sodium chloride    . sodium chloride     . acyclovir  400 mg Oral QHS  . amLODipine  7.5 mg Oral QHS  . calcium acetate  2,001 mg Oral TID WC  . calcium carbonate  2 tablet Oral QHS  . Chlorhexidine Gluconate Cloth  6 each Topical Q0600  . cinacalcet  60 mg Oral QPM  . ciprofloxacin  500 mg Oral QPM  . clonazePAM  0.5 mg Oral Q M,W,F-HD  . darbepoetin (ARANESP) injection - DIALYSIS  60 mcg Intravenous Q Fri-HD  . fluticasone furoate-vilanterol  1 puff Inhalation Daily  . insulin aspart  0-5 Units Subcutaneous QHS  . insulin aspart  0-9 Units Subcutaneous TID WC  . lacosamide  150 mg Oral BID  . lacosamide  150 mg Oral Q M,W,F-2000  . lamoTRIgine  150 mg Oral BID  . metoprolol tartrate  12.5 mg Oral BID  . multivitamin  1 tablet Oral QHS  . pantoprazole  40 mg Oral Daily   sodium chloride, sodium chloride, acetaminophen, bisacodyl, calcium carbonate, camphor-menthol **AND** hydrOXYzine, diphenhydrAMINE, docusate sodium, feeding supplement (NEPRO CARB STEADY), guaiFENesin-dextromethorphan, prochlorperazine **OR** prochlorperazine **OR** prochlorperazine, prochlorperazine, senna-docusate, simethicone, sorbitol, traZODone, zolpidem  Assessment/ Plan:   ESRD-Monday Wednesday Friday dialysis no heparin secondary to subdural hemorrhage next dialysis  06/20/2019  Altered mental status/acute on chronic subdural hemorrhage followed by neurology neurosurgery no surgical intervention.  History of Proteus bacteremia currently using Cipro has nephrolithiasis with possibility that this could be potential source.  Seizure disorders Vimpat and Lamictal Klonopin with dialysis  Hypertension well-controlled now on amlodipine and Lopressor'  Diabetes as per primary team  Multiple myeloma recent stem cell transplant planned and aborted secondary to subdural hemorrhage and bacteremia  Secondary hyperparathyroidism calcium phosphorus within goal continue binders vitamin D analogs calcium emetics  Anemia continues on darbepoetin  Disposition continue on rehab   LOS: [place] @TODAY @9 :36 AM

## 2019-06-21 ENCOUNTER — Inpatient Hospital Stay (HOSPITAL_COMMUNITY): Payer: 59

## 2019-06-21 ENCOUNTER — Inpatient Hospital Stay (HOSPITAL_COMMUNITY): Payer: 59 | Admitting: Occupational Therapy

## 2019-06-21 DIAGNOSIS — R0989 Other specified symptoms and signs involving the circulatory and respiratory systems: Secondary | ICD-10-CM

## 2019-06-21 LAB — IRON AND TIBC
Iron: 64 ug/dL (ref 45–182)
Saturation Ratios: 28 % (ref 17.9–39.5)
TIBC: 228 ug/dL — ABNORMAL LOW (ref 250–450)
UIBC: 164 ug/dL

## 2019-06-21 LAB — FERRITIN: Ferritin: 1560 ng/mL — ABNORMAL HIGH (ref 24–336)

## 2019-06-21 LAB — GLUCOSE, CAPILLARY
Glucose-Capillary: 86 mg/dL (ref 70–99)
Glucose-Capillary: 133 mg/dL — ABNORMAL HIGH (ref 70–99)
Glucose-Capillary: 112 mg/dL — ABNORMAL HIGH (ref 70–99)
Glucose-Capillary: 112 mg/dL — ABNORMAL HIGH (ref 70–99)

## 2019-06-21 MED ORDER — CHLORHEXIDINE GLUCONATE CLOTH 2 % EX PADS: 6.0000 | MEDICATED_PAD | Freq: Every day | CUTANEOUS | Status: DC

## 2019-06-21 NOTE — Progress Notes (Signed)
Physical Therapy Session Note  Patient Details  Name: Isaiah [name] [facility] MRN: [id number] Date of Birth: 1966/01/24  Today's Date: 06/21/2019 PT Individual Time: 1301-1331 PT Individual Time Calculation (min): 30 min   Short Term Goals: Week 1:  PT Short Term Goal 1 (Week 1): =LTG due to ELOS  Skilled Therapeutic Interventions/Progress Updates:    Received pt supine in bed, pt agreeable to PT and denied any pain. Today's session focused on functional mobility, ambulation, balance/coordination, and improved tolerance to activity. Pt performed bed mobility with supervision. PT donned tennis shoes mod A for time management purposes. Pt ambulated 189f without AD min A for anterior lean. Pt required verbal cues for R foot clearance. Pt performed BERG balance assessment and scored 34/56. Pt was educated on result findings indicating high fall risk. Pt ambulated 1545fwithout AD back to room min A with same verbal cues. Concluded session with pt supine in bed, needs within reach, and bed alarm on.   Therapy Documentation Precautions:  Precautions Precautions: Fall Precaution Comments: R inattention Restrictions Weight Bearing Restrictions: No  Pain: Pain Assessment Pain Scale: Faces Pain Score: 0-No pain    Balance: Balance Balance Assessed: Yes Standardized Balance Assessment Standardized Balance Assessment: Berg Balance Test Berg Balance Test Sit to Stand: Able to stand  independently using hands Standing Unsupported: Able to stand 2 minutes with supervision Sitting with Back Unsupported but Feet Supported on Floor or Stool: Able to sit safely and securely 2 minutes Stand to Sit: Controls descent by using hands Transfers: Able to transfer safely, definite need of hands Standing Unsupported with Eyes Closed: Able to stand 10 seconds with supervision Standing Ubsupported with Feet Together: Able to place feet together independently and stand for 1 minute with supervision From Standing,  Reach Forward with Outstretched Arm: Can reach forward >5 cm safely (2") From Standing Position, Pick up Object from Floor: Able to pick up shoe, needs supervision From Standing Position, Turn to Look Behind Over each Shoulder: Looks behind from both sides and weight shifts well Turn 360 Degrees: Able to turn 360 degrees safely but slowly Standing Unsupported, Alternately Place Feet on Step/Stool: Able to complete >2 steps/needs minimal assist Standing Unsupported, One Foot in Front: Loses balance while stepping or standing Standing on One Leg: Unable to try or needs assist to prevent fall Total Score: 34   Therapy/Group: Individual Therapy  An[place]omacT, DPT   06/21/2019, 2:19 PM

## 2019-06-21 NOTE — Progress Notes (Signed)
Occupational Therapy Weekly Progress Note  Patient Details  Name: Isaiah [name] MRN: [id number] Date of Birth: 11-Jun-1966  Beginning of progress report period: June 15, 2019 End of progress report period: June 21, 2019  Today's Date: 06/21/2019 OT Individual Time: 1005-1105 OT Individual Time Calculation (min): 60 min    Mr. Perrot continues to demonstrate fluctuating progress.  He is able to complete transfers at min assist level with use of the RW for support to the shower and the toilet, however there have been episodes where he has needed max assist secondary to severe forward lean as well as LOB to the left and right.  These episodes have come in the later morning after participation in PT sessions and on days that he is to have dialysis. Increased LOB to the right is also present secondary to his history of right hemiparesis as well.  He still demonstrates decreased awareness at times of his balance deficits as well as mod instructional cueing to sequence through tasks functionally such as completion of bathing, dressing, and grooming tasks.  Feel he needs continued CIR level therapy to reach supervision level for home.  Recommend continued OT at this time for increased consistency and improvement toward ADL tasks.     Patient continues to demonstrate the following deficits: muscle weakness, unbalanced muscle activation and decreased coordination, decreased visual acuity, decreased attention, decreased awareness, decreased problem solving, decreased safety awareness and decreased memory and decreased standing balance and decreased balance strategies and therefore will continue to benefit from skilled OT intervention to enhance overall performance with BADL and Reduce care partner burden.  Patient progressing toward long term goals..  Continue plan of care.  OT Short Term Goals Week 2:  OT Short Term Goal 1 (Week 2): Continue working on established supervision level goals set for long  term..  Skilled Therapeutic Interventions/Progress Updates:    Pt completed bathing and dressing during session.  He used the RW for functional ambulation to the walk-in shower with min assist to start session.  He needed min questioning cueing for thoroughness to complete shower sit to stand with min guard assist for balance with use of the grab bars for support.  He transferred out to the sink for dressing tasks with use of the RW for support, but was instructed to gather clothing before reaching the sink.  He was able to get his shirt and underpants, but did not get his pants or gripper socks.  He also forgot to donn his brief before his underpants and needed cueing to remove them and donn the brief first.  He needed mod instructional cueing with min assist to work on grooming tasks at the sink including washing his face, shaving his face, and brushing his teeth.  He would forget to turn off the water and dropped his toothbrush cover but did not remember that he needed to pick it up.  Mod assist to donn his pants over the RLE as he placed them on backwards to start and then placed both LEs into the left leg hole.  Finished session with transfer back to the bed to rest.  Call button and phone in reach with safety alarm in place.      Therapy Documentation Precautions:  Precautions Precautions: Fall Precaution Comments: R inattention Restrictions Weight Bearing Restrictions: No  Pain: Pain Assessment Pain Scale: Faces Pain Score: 0-No pain ADL: See Care Tool Section for some details of ADL tasks  Therapy/Group: Individual Therapy  Jahmeer Porche OTR/L 06/21/2019, 12:24 PM

## 2019-06-21 NOTE — Plan of Care (Signed)
  Problem: Consults Goal: RH BRAIN INJURY PATIENT EDUCATION Description: Description: See Patient Education module for eduction specifics Outcome: Progressing Goal: Diabetes Guidelines if Diabetic/Glucose > 140 Description: If diabetic or lab glucose is > 140 mg/dl - Initiate Diabetes/Hyperglycemia Guidelines & Document Interventions  Outcome: Progressing   Problem: RH BOWEL ELIMINATION Goal: RH STG MANAGE BOWEL WITH ASSISTANCE Description: STG Manage Bowel with mod I Assistance. Outcome: Progressing   Problem: RH SKIN INTEGRITY Goal: RH STG SKIN FREE OF INFECTION/BREAKDOWN Outcome: Progressing   Problem: RH SAFETY Goal: RH STG ADHERE TO SAFETY PRECAUTIONS W/ASSISTANCE/DEVICE Description: STG Adhere to Safety Precautions With mod I Assistance/Device. Outcome: Progressing   Problem: RH COGNITION-NURSING Goal: RH STG ANTICIPATES NEEDS/CALLS FOR ASSIST W/ASSIST/CUES Description: STG Anticipates Needs/Calls for Assist With mod I Assistance/Cues. Outcome: Progressing   Problem: RH PAIN MANAGEMENT Goal: RH STG PAIN MANAGED AT OR BELOW PT'S PAIN GOAL Description: Less than 3 out of 10 Outcome: Progressing   Problem: RH KNOWLEDGE DEFICIT BRAIN INJURY Goal: RH STG INCREASE KNOWLEDGE OF SELF CARE AFTER BRAIN INJURY Description: Patient able to demonstrate management of medications to control complications associated with hematoma/seizures Outcome: Progressing

## 2019-06-21 NOTE — Progress Notes (Signed)
Physical Therapy Session Note  Patient Details  Name: Isaiah [name] [facility] MRN: [id number] Date of Birth: [birth year]  Today's Date: 06/21/2019 PT Individual Time: 0803-0900 PT Individual Time Calculation (min): 57 min   Short Term Goals: Week 1:  PT Short Term Goal 1 (Week 1): =LTG due to ELOS  Skilled Therapeutic Interventions/Progress Updates:    Pt supine in bed upon PT arrival, agreeable to therapy tx and denies pain. Pt seated EOB dons gripper socks with supervision. Pt performed sit<>stand from EOB with min assist, upon initial standing pt with strong posterior lean and LOB backwards onto bed. Pt performed sit<>stand again this time with cues for anterior weightshift, min assist. Pt ambulated from room>gym x 120 ft without AD and with min assist, cues for postural control and step length. Pt worked on dynamic standing balance without UE support this session to include the following tasks: toe taps on aerobic step 2 x 10 per LE (x 1 total LOB posteriolaterally to the R during this), steps forward/back in place stepping over object x 10 per LE, and lateral sidesteps over object x 10 per LE, sidesteps 3 x 8 ft in each direction while holding mini squat, min assist overall aside from x 1 total LOB during toe taps. Pt performed 2 x 10 squats this session from mat with min assist, working on LE strength and hip/knee flexion to limit LOB, cues for techniques. Pt transferred to quadruped this session with min assist, cues for techniques to get into position. In quadruped position pt worked on core strength, postural control and hip strength to perform x 10 alternating hip extension. Pt with slow processing and impaired motor planning noted during transitions (attempted to go from quadruped>tall kneeling however unable), difficulty planning how to get from quadruped to sitting and supine>sitting on mat this session. Pt ambulated back to room with min assist without AD x 120 ft, transferred to recliner and left  with needs in reach, chair alarm set. Discussed use of RW at home for safety, pt agreeable/open to it if needed.     Therapy Documentation Precautions:  Precautions Precautions: Fall Precaution Comments: R inattention Restrictions Weight Bearing Restrictions: No    Therapy/Group: Individual Therapy  Netta Corrigan, PT, DPT, CSRS 06/21/2019, 7:54 AM

## 2019-06-21 NOTE — Progress Notes (Signed)
Nutrition Follow-up  DOCUMENTATION CODES:   Not applicable  INTERVENTION:   - Snacks TID between meals  - Nepro Shake po TID PRN, each supplement provides 425 kcal and 19 grams protein  - Continue rena-vit daily  - Encourage adequate PO intake  NUTRITION DIAGNOSIS:   Increased nutrient needs related to chronic illness (ESRD on HD) as evidenced by estimated needs.  Ongoing, being addressed via snacks and oral nutrition supplements  GOAL:   Patient will meet greater than or equal to 90% of their needs, Weight gain  Progressing  MONITOR:   PO intake, Supplement acceptance, Labs, Weight trends, I & O's  REASON FOR ASSESSMENT:   Malnutrition Screening Tool    ASSESSMENT:   53 year old male with PMH of T2DM, atrial fibrillation, ESRD on HD, CVA 2017, SS thalassemia, DVT/PE, GIB, seizure disorder, MM with recent admission to [facility] 05/26/19  for stem cell transplant but procedure held due to findings of proteus mirabilis bacteremia, aspiration PNA, acute on chronic SDH. Pt was discharged to home on 06/07/19 on Cipro but has had progressive weakness with inability to tolerate POs. Pt developed slurred speech with increase in right-sided weakness, worsening of right facial weakness with slurred speech, inability to walk and missed dialysis day. CT head done revealing 1.5 cm acute on chronic SDH with 4 mm midline shift and chronic left/right frontal, right temporal, occipital and bilateral cerebellar infracts. MRI brain showed right chronic SDH with acute features, 14 mm thickness in parietal lobe and 9 mm thickness in frontal lobe with 5 mm midline shift, negative for acute infarct and generalized atrophy. Pt admitted to CIR on 10/6.  Pt continues with HD on MWF schedule. Next dialysis scheduled for 10/14.  EDW per nephrology: 92 kg HD net UF 10/12: 2000 ml Post-HD weight 10/12: 81.2 kg  Per Nephrology note, pt will need new EDW at d/c. Holding phosphorus binder at this time due  to hypophosphatemia.  Unable to reach pt via phone call to room. Per nursing documentation, pt is receiving snacks.  Weight stable compared to admit weight. Will monitor trends.  Meal Completion: 50-100% x last 8 recorded meals (averaging 82%)  Medications reviewed and include: calcium carbonate, Sensipar, Aranesp, SSI, rena-vit, Protonix  Labs reviewed: sodium 134, phosphorus 1.8, hemoglobin 8.5 CBG's: 86-130 x 24 hours  Diet Order:   Diet Order            Diet renal/carb modified with fluid restriction Diet-HS Snack? Nothing; Fluid restriction: 1200 mL Fluid; Room service appropriate? Yes; Fluid consistency: Thin  Diet effective now              EDUCATION NEEDS:   Education needs have been addressed  Skin:  Skin Assessment: Reviewed RN Assessment  Last BM:  06/19/19  Height:   Ht Readings from Last 1 Encounters:  06/14/19 6' (1.829 m)    Weight:   Wt Readings from Last 1 Encounters:  06/21/19 84.9 kg    Ideal Body Weight:  80.9 kg  BMI:  Body mass index is 25.38 kg/m.  Estimated Nutritional Needs:   Kcal:  2000-2200  Protein:  105-125 grams  Fluid:  1000 ml + UOP    Gaynell Face, MS, RD, LDN Inpatient Clinical Dietitian Pager: [phone] Weekend/After Hours: [phone]

## 2019-06-21 NOTE — Progress Notes (Signed)
[place] PHYSICAL MEDICINE & REHABILITATION PROGRESS NOTE   Subjective/Complaints: Patient seen working with therapy this morning.  He states he slept well overnight.  He was seen by nephrology yesterday, notes reviewed.  Yesterday there were concerns from OT and wife regarding increasing dysarthria, confusion, lean.  CT scan was ordered, which was relatively stable.  This a.m. patient notes that he is feeling well.  Discussed with physical therapy, who knows patient appears to be stable as well.  ROS: Denies CP, SOB, N/V/D  Objective: Ct Head Wo Contrast  Result Date: 06/20/2019 CLINICAL DATA:  Neuro deficit(s), subacute. Additional history: Patient was working with therapy and had sudden decrease in mobility, minimal assist this morning, now max assist, patient reports slight blurred vision as well as nausea earlier. EXAM: CT HEAD WITHOUT CONTRAST TECHNIQUE: Contiguous axial images were obtained from the base of the skull through the vertex without intravenous contrast. COMPARISON:  Head CT 06/12/2019, brain MRI 01/07/2016 FINDINGS: Brain: Redemonstrated right subdural collection, greatest overlying the frontoparietal lobes, and measuring up to 1.5 cm in thickness (series 5, image 37). As before, the collection is predominantly intermediate density. However, there are subtle persistent internal foci of hyperdensity which may reflect small components of acute/subacute blood products and/or septations. There is mass effect upon the underlying right cerebral hemisphere. Partial effacement of the right lateral ventricle. 4 mm leftward midline shift. Again demonstrated are chronic infarcts within the left frontal lobe, right frontal and parietal lobes, right temporal occipital lobe, and bilateral cerebellar hemispheres. No new infarct is definitively identified. Ill-defined hypoattenuation of the cerebral white matter is nonspecific, but consistent with chronic small vessel ischemic disease. No evidence  of intracranial mass. Mild generalized parenchymal atrophy. Partially empty sella turcica. Vascular: No hyperdense vessel. Atherosclerotic calcification of the carotid artery siphons and vertebrobasilar system. Skull: No calvarial fracture Sinuses/Orbits: Visualized orbits demonstrate no acute abnormality. Mild scattered paranasal sinus mucosal thickening. Moderate-sized left maxillary sinus mucous retention cyst. No significant mastoid effusion. These results were called by telephone at the time of interpretation on 06/20/2019 at 3:26 pm to provider [facility] , who verbally acknowledged these results. IMPRESSION: 1. Mixed density right subdural hematoma not significantly changed as compared to head CT 06/12/2019. Stable mass effect on the right cerebral hemisphere with 4 mm leftward midline shift. 2. Multiple redemonstrated chronic infarcts as detailed. No new infarct is identified, although extensive chronic changes somewhat limit evaluation. Consider brain MRI for further evaluation, if clinically warranted. 3. Generalized atrophy with chronic small vessel ischemic disease. Electronically Signed   By: Kellie Simmering   On: 06/20/2019 15:26   Recent Labs    06/20/19 1849  WBC 8.7  HGB 8.5*  HCT 22.7*  PLT 215   Recent Labs    06/20/19 1848  NA 134*  K 4.5  CL 94*  CO2 25  GLUCOSE 116*  BUN 60*  CREATININE 14.01*  CALCIUM 8.9    Intake/Output Summary (Last 24 hours) at 06/21/2019 0901 Last data filed at 06/20/2019 2345 Gross per 24 hour  Intake 240 ml  Output 2000 ml  Net -1760 ml     Physical Exam: Vital Signs Blood pressure (!) 131/58, pulse 71, temperature 98.5 F (36.9 C), temperature source Oral, resp. rate 16, height 6' (1.829 m), weight 84.9 kg, SpO2 96 %. Constitutional: No distress . Vital signs reviewed. HENT: Normocephalic.  Atraumatic. Eyes: EOMI. No discharge. Cardiovascular: No JVD. Respiratory: Normal effort.  No stridor. GI: Non-distended. Skin: Warm and dry.   Intact.  Psych: Normal mood.  Normal behavior. Musc: No edema in extremities.  No tenderness in extremities. Neurologic: Alert Dysarthria Facial weakness Motor: LUE/LLE: 5/5 proximal distal RUE: 4-4+/5 proximal distal RLE: 4+/5 proximal distal    Assessment/Plan: 1. Functional deficits secondary to SDH which require 3+ hours per day of interdisciplinary therapy in a comprehensive inpatient rehab setting.  Physiatrist is providing close team supervision and 24 hour management of active medical problems listed below.  Physiatrist and rehab team continue to assess barriers to discharge/monitor patient progress toward functional and medical goals  Care Tool:  Bathing    Body parts bathed by patient: Right arm, Left arm, Chest, Abdomen, Face   Body parts bathed by helper: Left lower leg, Right lower leg, Left upper leg, Right upper leg, Buttocks, Front perineal area, Abdomen, Chest, Left arm, Right arm Body parts n/a: Front perineal area, Buttocks, Right upper leg, Left lower leg, Right lower leg, Left upper leg(Pt declined completing secondary to stating it was done earlier)   Bathing assist Assist Level: Maximal Assistance - Patient 24 - 49%     Upper Body Dressing/Undressing Upper body dressing Upper body dressing/undressing activity did not occur (including orthotics): N/A What is the patient wearing?: Pull over shirt    Upper body assist Assist Level: Set up assist    Lower Body Dressing/Undressing Lower body dressing      What is the patient wearing?: Underwear/pull up, Pants     Lower body assist Assist for lower body dressing: Maximal Assistance - Patient 25 - 49%     Toileting Toileting    Toileting assist Assist for toileting: Minimal Assistance - Patient > 75%     Transfers Chair/bed transfer  Transfers assist     Chair/bed transfer assist level: Minimal Assistance - Patient > 75%     Locomotion Ambulation   Ambulation assist      Assist  level: Minimal Assistance - Patient > 75% Assistive device: No Device Max distance: 120 ft   Walk 10 feet activity   Assist     Assist level: Minimal Assistance - Patient > 75% Assistive device: No Device   Walk 50 feet activity   Assist    Assist level: Minimal Assistance - Patient > 75% Assistive device: No Device    Walk 150 feet activity   Assist Walk 150 feet activity did not occur: Safety/medical concerns(High fatigue level)  Assist level: Moderate Assistance - Patient - 50 - 74% Assistive device: No Device    Walk 10 feet on uneven surface  activity   Assist     Assist level: Maximal Assistance - Patient 25 - 49%(mod A for ramp, max A when ambulating over mulch) Assistive device: Other (comment)(no AD)   Wheelchair     Assist Will patient use wheelchair at discharge?: No             Wheelchair 50 feet with 2 turns activity    Assist            Wheelchair 150 feet activity     Assist          Blood pressure (!) 131/58, pulse 71, temperature 98.5 F (36.9 C), temperature source Oral, resp. rate 16, height 6' (1.829 m), weight 84.9 kg, SpO2 96 %.    Medical Problem List and Plan: 1.  Impaired ADLs/mobility and function secondary to acute on chronic SDH with 5 mm of midline shift to left- with R hemiparesis and dysarthria- f/u with [facility] after rehab; no head  CT unless has a change in function, per NSU  Continue CIR  Repeat head CT on 10/12 reviewed-stable.  Will hold off on repeat MRI at present given what appeared to be fluctuations secondary to nonacute events.  Will consider repeat MRI if necessary. 2.  Antithrombotics: -DVT/anticoagulation:  Mechanical: Sequential compression devices, below knee Bilateral lower extremities and IVC filter             -antiplatelet therapy: N/A due to SDH 3. Pain Management:  Tylenol prn.  4. Mood: LCSW to follow for evaluation and support.              -antipsychotic agents: N/A 5.  Neuropsych: This patient is not fully capable of making decisions on his own behalf. 6. Skin/Wound Care: Routine pressure relief measures.  7. Fluids/Electrolytes/Nutrition: Strict I/O. Labs per HD protocol.   9. Proteus bacteremia/Recent aspiration PNA: Treated with Zosyn and transitioned to cipro on 9/24 for 120 days 10. Seizure disorder: On Vimpat and Lamictal bid.  Post HD on MWF needs additional doses of clonazepam 0.5 mg and Lamictal 150 mg to prevent breakthrough seizures.   No seizures from admission to CIR to 10/13 11. T2DM with hyperglycemia: Monitor BS ac/hs. Continue CBG (last 3)  Recent Labs    06/20/19 1753 06/20/19 2315 06/21/19 0617  GLUCAP 105* 89 86   Appears to be stabilizing on 10/13 12. ESRD: Continue hemodialysis MWF at the end of the day to help with tolerance of therapy.  13. HTN: Monitor BP tid  Amlodipine increased to 7.5 daily  Continue metoprolol bid.   Labile on 10/13 14.  Anemia of chronic disease:  Hemoglobin 8.5 on 10/12 15. H/o A fib/PFO: Monitor HR tid--on metoprolol. Was on ASA only. No coumadin due to recurrent GIB and was on ASA--d/c due to SDH. Vitals:   06/21/19 0540 06/21/19 0542  BP: (!) 131/51 (!) 131/58  Pulse: 71   Resp: 16   Temp: 98.5 F (36.9 C)   SpO2: 96%    Heart rate controlled on 10/13 16. H/o SS thalassemia/PE/DVT: IVC filter placed 09/2015.  17. 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 Multiple myeloma- was scheduled for autologous stem cell transplant at [facility]- will need f/u after rehab d/c. Labs also need to be monitored at least weekly for now. 19. B/L renal stones-Intermittent monitoring.  20. OSA on CPAP- CPAP as required for nighttime.  LOS: 7 days A FACE TO FACE EVALUATION WAS PERFORMED  Tyr Franca Lorie Phenix 06/21/2019, 9:01 AM   HR controlled

## 2019-06-21 NOTE — Progress Notes (Signed)
Speech Language Pathology Weekly Progress and Session Note  Patient Details  Name: Isaiah [name] [facility] MRN: [id number] Date of Birth: December 25, 1965  Beginning of progress report period: June 15, 2019 End of progress report period: June 21, 2019  Today's Date: 06/21/2019 SLP Individual Time: 1405-1502 SLP Individual Time Calculation (min): 57 min  Short Term Goals: Week 1: SLP Short Term Goal 1 (Week 1): Pt will recall novel, daily information with compensatory aid with mod A verbal cues. SLP Short Term Goal 1 - Progress (Week 1): Met SLP Short Term Goal 2 (Week 1): Pt will demonstrate sustained attention to functional task in 30 minute intervals with supervision A verbal cues for redirection. SLP Short Term Goal 2 - Progress (Week 1): Not met SLP Short Term Goal 3 (Week 1): Pt will complete basic functional problem solving tasks (ex: ADLs) with min A verbal cues. SLP Short Term Goal 3 - Progress (Week 1): Not met SLP Short Term Goal 4 (Week 1): Pt will self-monitor and self-correct functional errors in problem solving tasks with mod A verbal cues. SLP Short Term Goal 4 - Progress (Week 1): Not met SLP Short Term Goal 5 (Week 1): Pt will demonstrate use of speech intelligibility strategies at sentence level for 85% intelligibility with supervision A verbal cues to slow rate and correct articulation errors. SLP Short Term Goal 5 - Progress (Week 1): Not met SLP Short Term Goal 6 (Week 1): Pt will complete higher level word finding tasks with min A verbal cues for use of strategies.(focusing on thought organization verse word finding) SLP Short Term Goal 6 - Progress (Week 1): Discontinued (comment)    New Short Term Goals: Week 2: SLP Short Term Goal 1 (Week 2): STG=LTG due to ELOS  Weekly Progress Updates:Pt made poor progress this reporting period meeting 1 out 6 goals, due to limited/reclucant participation in therapy and inconsistent demonstration of ability. Pt's wife encouraged  increased participation in therapeutic services and follow up MRI/CT (06/20/19) indicated no acute changes, therefore suggest pt's inconsistent performance likely due to fatigue and motivation along with impact of acute hematoma. Pt demonstrated ability to achieve speech intelligibility ranging from 85-90% intelligibility with min-mod I cues for phonemic errors due to motor impairment, increase rate and reduced vocal intensity. Pt presents with deficits in thought organization compared to word finding, upon further investigation, therefore therapy plan has been adjusted. SLP will continue to focus on basic problem solving skills, though organization, speech intelligibility strategies, recall strategies (limited due to impaired vision at baseline), sustained attention, emergent awareness and family education. Pt would continue to benefit from skilled ST services in order to maximize functional independence and reduce burden of care, likely requiring 24 hour supervision and continue ST services.     Intensity: Minumum of 1-2 x/day, 30 to 90 minutes Frequency: 3 to 5 out of 7 days Duration/Length of Stay: 10-14 days Treatment/Interventions: Cognitive remediation/compensation;Cueing hierarchy;Functional tasks;Patient/family education;Internal/external aids;Speech/Language facilitation   Daily Session  Skilled Therapeutic Interventions: Skilled ST services focused on cognitive skills. SLP facilitated basic problem solving in card sequencing task, pt demonstrated ability to functional sequence 3 step cards in 4/6 opportunities and verbally sequence (though organize, use of specific descriptive words) with min A verbal cues. Pt was able to functional sequence 4 step cards in 2/4 opportunities with max A verbal cues for error awareness and mod A verbal cues during verbal sequencing task for though organization and use of descriptive words. Pt demonstrated 90% intelligibility at phrase/sentence level during  picture description task with occasional phonemic errors. SLP also facilitated basic problem solving in card sorting task by 6 shapes with various colors/numbers of shapes, pt required initial mod A verbal cues to understand concept fading to supervision for problem solving and error awareness. Pt was left in room with call bell within reach and bed alarm set. ST recommends to continue skilled ST services.      General    Pain Pain Assessment Pain Scale: Faces Pain Score: 0-No pain  Therapy/Group: Individual Therapy    [name] 06/21/2019, 3:18 PM

## 2019-06-21 NOTE — Progress Notes (Signed)
[facility] ROUNDING NOTE   Subjective:   This is a 53 year old gentleman with a history of diabetes end-stage renal disease Monday Wednesday Friday dialysis atrial fibrillation history of CVA 2017 history of thalassemia history of DVT/pulmonary embolus negative coagulopathy with IVC filter since 2017, history GIB, history of seizure disorders, history of multiple myeloma with recent admission to [facility] 05/26/2019 for stem cell transplant procedure was held due to findings of Proteus bacteremia.  He was also found to have aspiration pneumonia and acute on chronic subdural hemorrhage with bilateral renal stones also found.  He had progressive weakness slurred speech worsening right-sided facial weakness and droop he was found to have a CT scan of the head that showed 1.5 cm acute on chronic subdural hemorrhage with 4 mm midline shift.  Dr. Ellene Route was consulted and recommended conservative therapy.  He was also seen by neurology Dr. Leonel Ramsay who felt this was a low suspicion for neurological cause mild worsening right-sided weakness and follow-up CT scan of head 10 4 showed stable right subdural hemorrhage with stable mass-effect.  Mentation has continued to improve during hospitalization.  Blood pressure 131/58 pulse 71 temperature 98.5 O2 sats 96% room air  Sodium 134 potassium 4.5 chloride 94 CO2 25 BUN 60 creatinine 14 glucose 116 calcium 8.9 phosphorus 1.8 albumin 3.5 WBC 8.7 hemoglobin 8.5 platelets 215  Acyclovir 400 mg nightly, amlodipine 7.5 mg nightly, PhosLo 3 tablets 3 times daily, Tums 400 mg 2 tablets at bedtime, Sensipar 60 mg daily, Cipro 500 mg daily, darbepoetin 60 mcg last administered 06/17/2019, Klonopin 0.5 mg Monday Wednesday Friday, insulin sliding scale, Vimpat 250 mg Monday Wednesday Friday, Lamictal 150 mg twice daily, metoprolol 12.5 mg twice daily, multivitamins 1 daily Protonix 40 mg daily   Objective:  Vital signs in last 24 hours:  Temp:  [98 F  (36.7 C)-98.5 F (36.9 C)] 98.5 F (36.9 C) (10/13 0540) Pulse Rate:  [67-103] 71 (10/13 0540) Resp:  [16-18] 16 (10/13 0540) BP: (107-170)/(51-87) 131/58 (10/13 0542) SpO2:  [96 %-100 %] 96 % (10/13 0540) Weight:  [81.2 kg-84.9 kg] 84.9 kg (10/13 0540)  Weight change:  Filed Weights   06/19/19 0558 06/20/19 2259 06/21/19 0540  Weight: 86.6 kg 81.2 kg 84.9 kg    Intake/Output: I/O last 3 completed shifts: In: 440 [P.O.:440] Out: 2000 [Other:2000]   Intake/Output this shift:  No intake/output data recorded.  General:NAD, WNWD male, sitting in bedside chair Heart:RRR, no mrg Lungs:CTAB Abdomen:soft, NTND Extremities:no LE edema Dialysis Access: R AVF +b/t   Basic Metabolic Panel: Recent Labs  Lab 06/15/19 1357 06/16/19 1202 06/17/19 1338 06/20/19 1848  NA 137 137 136 134*  K 4.8 4.5 4.6 4.5  CL 97* 97* 96* 94*  CO2 _0 GLUCOSE 145* 135* 151* 116*  BUN 44* 26* 43* 60*  CREATININE 11.18* 8.74* 11.32* 14.01*  CALCIUM 8.7* 8.6* 8.9 8.9  PHOS 2.6 2.5 2.2* 1.8*    Liver Function Tests: Recent Labs  Lab 06/15/19 1357 06/16/19 1202 06/17/19 1338 06/20/19 1848  ALBUMIN 3.4* 3.5 3.4* 3.5   No results for input(s): LIPASE, AMYLASE in the last 168 hours. No results for input(s): AMMONIA in the last 168 hours.  CBC: Recent Labs  Lab 06/15/19 1357 06/16/19 1202 06/17/19 1338 06/20/19 1849  WBC 7.0 6.3 7.5 8.7  HGB 9.0* 9.8* 8.5* 8.5*  HCT RESULTS UNAVAILABLE DUE TO INTERFERING SUBSTANCE 26.1* RESULTS UNAVAILABLE DUE TO INTERFERING SUBSTANCE 22.7*  MCV RESULTS UNAVAILABLE DUE TO INTERFERING SUBSTANCE  92.2 RESULTS UNAVAILABLE DUE TO INTERFERING SUBSTANCE 88.3  PLT 288 291 249 215    Cardiac Enzymes: No results for input(s): CKTOTAL, CKMB, CKMBINDEX, TROPONINI in the last 168 hours.  BNP: Invalid input(s): POCBNP  CBG: Recent Labs  Lab 06/20/19 0631 06/20/19 1158 06/20/19 1753 06/20/19 2315 06/21/19 0617  GLUCAP 73 130* 105* 89 86     Microbiology: Results for orders placed or performed during the hospital encounter of 06/09/19  SARS Coronavirus 2 [facility] order, Performed in [facility] hospital lab) Nasopharyngeal Nasopharyngeal Swab     Status: None   Collection Time: 06/09/19 10:47 AM   Specimen: Nasopharyngeal Swab  Result Value Ref Range Status   SARS Coronavirus 2 NEGATIVE NEGATIVE Final    Comment: (NOTE) If result is NEGATIVE SARS-CoV-2 target nucleic acids are NOT DETECTED. The SARS-CoV-2 RNA is generally detectable in upper and lower  respiratory specimens during the acute phase of infection. The lowest  concentration of SARS-CoV-2 viral copies this assay can detect is 250  copies / mL. A negative result does not preclude SARS-CoV-2 infection  and should not be used as the sole basis for treatment or other  patient management decisions.  A negative result may occur with  improper specimen collection / handling, submission of specimen other  than nasopharyngeal swab, presence of viral mutation(s) within the  areas targeted by this assay, and inadequate number of viral copies  (<250 copies / mL). A negative result must be combined with clinical  observations, patient history, and epidemiological information. If result is POSITIVE SARS-CoV-2 target nucleic acids are DETECTED. The SARS-CoV-2 RNA is generally detectable in upper and lower  respiratory specimens dur ing the acute phase of infection.  Positive  results are indicative of active infection with SARS-CoV-2.  Clinical  correlation with patient history and other diagnostic information is  necessary to determine patient infection status.  Positive results do  not rule out bacterial infection or co-infection with other viruses. If result is PRESUMPTIVE POSTIVE SARS-CoV-2 nucleic acids MAY BE PRESENT.   A presumptive positive result was obtained on the submitted specimen  and confirmed on repeat testing.  While 2019 novel coronavirus   (SARS-CoV-2) nucleic acids may be present in the submitted sample  additional confirmatory testing may be necessary for epidemiological  and / or clinical management purposes  to differentiate between  SARS-CoV-2 and other Sarbecovirus currently known to infect humans.  If clinically indicated additional testing with an alternate test  methodology [phone]) is advised. The SARS-CoV-2 RNA is generally  detectable in upper and lower respiratory sp ecimens during the acute  phase of infection. The expected result is Negative. Fact Sheet for Patients:  StrictlyIdeas.no Fact Sheet for Healthcare Providers: BankingDealers.co.za This test is not yet approved or cleared by the [place] FDA and has been authorized for detection and/or diagnosis of SARS-CoV-2 by FDA under an Emergency Use Authorization (EUA).  This EUA will remain in effect (meaning this test can be used) for the duration of the COVID-19 declaration under Section 564(b)(1) of the Act, 21 U.S.C. section 360bbb-3(b)(1), unless the authorization is terminated or revoked sooner. Performed at [facility], [place] [address] [place], [place] [address]   Blood culture (routine x 2)     Status: None   Collection Time: 06/09/19 10:47 AM   Specimen: BLOOD  Result Value Ref Range Status   Specimen Description BLOOD UNKNOWN  Final   Special Requests   Final    BOTTLES DRAWN AEROBIC AND  ANAEROBIC Blood Culture results may not be optimal due to an inadequate volume of blood received in culture bottles   Culture   Final    NO GROWTH 5 DAYS Performed at [facility] [address] [place], [place] [address]    Report Status 06/14/2019 FINAL  Final  Blood culture (routine x 2)     Status: None   Collection Time: 06/09/19  2:22 PM   Specimen: BLOOD  Result Value Ref Range Status   Specimen Description BLOOD LEFT ANTECUBITAL  Final   Special Requests   Final    BOTTLES  DRAWN AEROBIC ONLY Blood Culture results may not be optimal due to an inadequate volume of blood received in culture bottles   Culture   Final    NO GROWTH 5 DAYS Performed at [facility], [place] [address] [place], [place] [address]    Report Status 06/14/2019 FINAL  Final    Coagulation Studies: No results for input(s): LABPROT, INR in the last 72 hours.  Urinalysis: No results for input(s): COLORURINE, LABSPEC, PHURINE, GLUCOSEU, HGBUR, BILIRUBINUR, KETONESUR, PROTEINUR, UROBILINOGEN, NITRITE, LEUKOCYTESUR in the last 72 hours.  Invalid input(s): APPERANCEUR    Imaging: Ct Head Wo Contrast  Result Date: 06/20/2019 CLINICAL DATA:  Neuro deficit(s), subacute. Additional history: Patient was working with therapy and had sudden decrease in mobility, minimal assist this morning, now max assist, patient reports slight blurred vision as well as nausea earlier. EXAM: CT HEAD WITHOUT CONTRAST TECHNIQUE: Contiguous axial images were obtained from the base of the skull through the vertex without intravenous contrast. COMPARISON:  Head CT 06/12/2019, brain MRI 01/07/2016 FINDINGS: Brain: Redemonstrated right subdural collection, greatest overlying the frontoparietal lobes, and measuring up to 1.5 cm in thickness (series 5, image 37). As before, the collection is predominantly intermediate density. However, there are subtle persistent internal foci of hyperdensity which may reflect small components of acute/subacute blood products and/or septations. There is mass effect upon the underlying right cerebral hemisphere. Partial effacement of the right lateral ventricle. 4 mm leftward midline shift. Again demonstrated are chronic infarcts within the left frontal lobe, right frontal and parietal lobes, right temporal occipital lobe, and bilateral cerebellar hemispheres. No new infarct is definitively identified. Ill-defined hypoattenuation of the cerebral white matter is nonspecific, but consistent with  chronic small vessel ischemic disease. No evidence of intracranial mass. Mild generalized parenchymal atrophy. Partially empty sella turcica. Vascular: No hyperdense vessel. Atherosclerotic calcification of the carotid artery siphons and vertebrobasilar system. Skull: No calvarial fracture Sinuses/Orbits: Visualized orbits demonstrate no acute abnormality. Mild scattered paranasal sinus mucosal thickening. Moderate-sized left maxillary sinus mucous retention cyst. No significant mastoid effusion. These results were called by telephone at the time of interpretation on 06/20/2019 at 3:26 pm to provider [facility] LOVE , who verbally acknowledged these results. IMPRESSION: 1. Mixed density right subdural hematoma not significantly changed as compared to head CT 06/12/2019. Stable mass effect on the right cerebral hemisphere with 4 mm leftward midline shift. 2. Multiple redemonstrated chronic infarcts as detailed. No new infarct is identified, although extensive chronic changes somewhat limit evaluation. Consider brain MRI for further evaluation, if clinically warranted. 3. Generalized atrophy with chronic small vessel ischemic disease. Electronically Signed   By: Kellie Simmering   On: 06/20/2019 15:26     Medications:   . sodium chloride    . sodium chloride     . acyclovir  400 mg Oral QHS  . amLODipine  7.5 mg Oral QHS  .  calcium acetate  2,001 mg Oral TID WC  . calcium carbonate  2 tablet Oral QHS  . Chlorhexidine Gluconate Cloth  6 each Topical Q0600  . cinacalcet  60 mg Oral QPM  . ciprofloxacin  500 mg Oral QPM  . clonazePAM  0.5 mg Oral Q M,W,F-HD  . darbepoetin (ARANESP) injection - DIALYSIS  60 mcg Intravenous Q Fri-HD  . fluticasone furoate-vilanterol  1 puff Inhalation Daily  . insulin aspart  0-5 Units Subcutaneous QHS  . insulin aspart  0-9 Units Subcutaneous TID WC  . lacosamide  150 mg Oral BID  . lacosamide  150 mg Oral Q M,W,F-2000  . lamoTRIgine  150 mg Oral BID  . metoprolol tartrate   12.5 mg Oral BID  . multivitamin  1 tablet Oral QHS  . pantoprazole  40 mg Oral Daily   sodium chloride, sodium chloride, acetaminophen, bisacodyl, calcium carbonate, camphor-menthol **AND** hydrOXYzine, diphenhydrAMINE, docusate sodium, feeding supplement (NEPRO CARB STEADY), guaiFENesin-dextromethorphan, prochlorperazine **OR** prochlorperazine **OR** prochlorperazine, prochlorperazine, senna-docusate, simethicone, sorbitol, traZODone, zolpidem  Assessment/ Plan:   ESRD-Monday Wednesday Friday dialysis no heparin secondary to subdural hemorrhage.  Appeared to tolerate dialysis well 06/20/2019 with 2 L ultrafiltration.  Plan next dialysis 06/22/2019  Altered mental status/acute on chronic subdural hemorrhage followed by neurology neurosurgery no surgical intervention.  History of Proteus bacteremia currently using Cipro has nephrolithiasis with possibility that this could be potential source.  Seizure disorders Vimpat ,Lamictal and Klonopin with dialysis.  Hypophosphatemia will hold calcium phosphate at this time  Hypertension well-controlled now on amlodipine and Lopressor  Diabetes as per primary team  Multiple myeloma recent stem cell transplant planned and aborted secondary to subdural hemorrhage and bacteremia  Secondary hyperparathyroidism calcium phosphorus within goal continue binders vitamin D analogs and Sensipar  Anemia continues on darbepoetin 60 mcg dose every Friday  Disposition continue on rehab   LOS: [place] _0 _1 :44 AM

## 2019-06-22 ENCOUNTER — Inpatient Hospital Stay (HOSPITAL_COMMUNITY): Payer: 59 | Admitting: Occupational Therapy

## 2019-06-22 ENCOUNTER — Inpatient Hospital Stay (HOSPITAL_COMMUNITY): Payer: 59

## 2019-06-22 DIAGNOSIS — I480 Paroxysmal atrial fibrillation: Secondary | ICD-10-CM

## 2019-06-22 DIAGNOSIS — R7881 Bacteremia: Secondary | ICD-10-CM

## 2019-06-22 LAB — CBC
Hemoglobin: 8.6 g/dL — ABNORMAL LOW (ref 13.0–17.0)
Platelets: 226 10*3/uL (ref 150–400)
WBC: 9.1 10*3/uL (ref 4.0–10.5)
nRBC: 0.3 % — ABNORMAL HIGH (ref 0.0–0.2)

## 2019-06-22 LAB — RENAL FUNCTION PANEL
Albumin: 3.4 g/dL — ABNORMAL LOW (ref 3.5–5.0)
Anion gap: 15 (ref 5–15)
BUN: 41 mg/dL — ABNORMAL HIGH (ref 6–20)
CO2: 26 mmol/L (ref 22–32)
Calcium: 8.1 mg/dL — ABNORMAL LOW (ref 8.9–10.3)
Chloride: 96 mmol/L — ABNORMAL LOW (ref 98–111)
Creatinine, Ser: 11.36 mg/dL — ABNORMAL HIGH (ref 0.61–1.24)
GFR calc Af Amer: 5 mL/min — ABNORMAL LOW (ref >60)
GFR calc non Af Amer: 4 mL/min — ABNORMAL LOW (ref >60)
Glucose, Bld: 123 mg/dL — ABNORMAL HIGH (ref 70–99)
Phosphorus: 2 mg/dL — ABNORMAL LOW (ref 2.5–4.6)
Potassium: 4.1 mmol/L (ref 3.5–5.1)
Sodium: 137 mmol/L (ref 135–145)

## 2019-06-22 LAB — GLUCOSE, CAPILLARY
Glucose-Capillary: 84 mg/dL (ref 70–99)
Glucose-Capillary: 167 mg/dL — ABNORMAL HIGH (ref 70–99)
Glucose-Capillary: 82 mg/dL (ref 70–99)
Glucose-Capillary: 112 mg/dL — ABNORMAL HIGH (ref 70–99)

## 2019-06-22 NOTE — Progress Notes (Signed)
[name]ABILITATION PROGRESS NOTE   Subjective/Complaints: Patient seen working with therapies this AM.  He is ambulating well.  He denies complains.   ROS: Denies CP, SOB, N/V/D  Objective: Ct Head Wo Contrast  Result Date: 06/20/2019 CLINICAL DATA:  Neuro deficit(s), subacute. Additional history: Patient was working with therapy and had sudden decrease in mobility, minimal assist this morning, now max assist, patient reports slight blurred vision as well as nausea earlier. EXAM: CT HEAD WITHOUT CONTRAST TECHNIQUE: Contiguous axial images were obtained from the base of the skull through the vertex without intravenous contrast. COMPARISON:  Head CT 06/12/2019, brain MRI 01/07/2016 FINDINGS: Brain: Redemonstrated right subdural collection, greatest overlying the frontoparietal lobes, and measuring up to 1.5 cm in thickness (series 5, image 37). As before, the collection is predominantly intermediate density. However, there are subtle persistent internal foci of hyperdensity which may reflect small components of acute/subacute blood products and/or septations. There is mass effect upon the underlying right cerebral hemisphere. Partial effacement of the right lateral ventricle. 4 mm leftward midline shift. Again demonstrated are chronic infarcts within the left frontal lobe, right frontal and parietal lobes, right temporal occipital lobe, and bilateral cerebellar hemispheres. No new infarct is definitively identified. Ill-defined hypoattenuation of the cerebral white matter is nonspecific, but consistent with chronic small vessel ischemic disease. No evidence of intracranial mass. Mild generalized parenchymal atrophy. Partially empty sella turcica. Vascular: No hyperdense vessel. Atherosclerotic calcification of the carotid artery siphons and vertebrobasilar system. Skull: No calvarial fracture Sinuses/Orbits: Visualized orbits demonstrate no acute abnormality. Mild scattered  paranasal sinus mucosal thickening. Moderate-sized left maxillary sinus mucous retention cyst. No significant mastoid effusion. These results were called by telephone at the time of interpretation on 06/20/2019 at 3:26 pm to provider [facility] LOVE , who verbally acknowledged these results. IMPRESSION: 1. Mixed density right subdural hematoma not significantly changed as compared to head CT 06/12/2019. Stable mass effect on the right cerebral hemisphere with 4 mm leftward midline shift. 2. Multiple redemonstrated chronic infarcts as detailed. No new infarct is identified, although extensive chronic changes somewhat limit evaluation. Consider brain MRI for further evaluation, if clinically warranted. 3. Generalized atrophy with chronic small vessel ischemic disease. Electronically Signed   By: Kellie Simmering   On: 06/20/2019 15:26   Recent Labs    06/20/19 1849  WBC 8.7  HGB 8.5*  HCT 22.7*  PLT 215   Recent Labs    06/20/19 1848  NA 134*  K 4.5  CL 94*  CO2 25  GLUCOSE 116*  BUN 60*  CREATININE 14.01*  CALCIUM 8.9    Intake/Output Summary (Last 24 hours) at 06/22/2019 0834 Last data filed at 06/21/2019 1827 Gross per 24 hour  Intake 720 ml  Output -  Net 720 ml     Physical Exam: Vital Signs Blood pressure 114/65, pulse 71, temperature 98.9 F (37.2 C), temperature source Oral, resp. rate 16, height 6' (1.829 m), weight 84.4 kg, SpO2 98 %. Constitutional: No distress . Vital signs reviewed. HENT: Normocephalic.  Atraumatic. Eyes: EOMI. No discharge. Cardiovascular: No JVD. Respiratory: Normal effort.  No stridor. GI: Non-distended. Skin: Warm and dry.  Intact. Psych: Normal mood.  Normal behavior. Musc: No edema in extremities.  No tenderness in extremities. Neurologic: Alert Dysarthria Facial weakness Motor: LUE/LLE: 5/5 proximal distal RUE: 4-4+/5 proximal distal RLE: 4+/5 proximal distal   Assessment/Plan: 1. Functional deficits secondary to SDH which require 3+  hours per day of interdisciplinary therapy in a comprehensive  inpatient rehab setting.  Physiatrist is providing close team supervision and 24 hour management of active medical problems listed below.  Physiatrist and rehab team continue to assess barriers to discharge/monitor patient progress toward functional and medical goals  Care Tool:  Bathing    Body parts bathed by patient: Right arm, Left arm, Chest, Abdomen, Face, Left lower leg, Right lower leg, Left upper leg, Front perineal area, Buttocks, Right upper leg   Body parts bathed by helper: Left lower leg, Right lower leg, Left upper leg, Right upper leg, Buttocks, Front perineal area, Abdomen, Chest, Left arm, Right arm Body parts n/a: Front perineal area, Buttocks, Right upper leg, Left lower leg, Right lower leg, Left upper leg(Pt declined completing secondary to stating it was done earlier)   Bathing assist Assist Level: Minimal Assistance - Patient > 75%     Upper Body Dressing/Undressing Upper body dressing Upper body dressing/undressing activity did not occur (including orthotics): N/A What is the patient wearing?: Pull over shirt    Upper body assist Assist Level: Supervision/Verbal cueing    Lower Body Dressing/Undressing Lower body dressing      What is the patient wearing?: Pants, Underwear/pull up, Incontinence brief     Lower body assist Assist for lower body dressing: Minimal Assistance - Patient > 75%     Toileting Toileting    Toileting assist Assist for toileting: Minimal Assistance - Patient > 75%     Transfers Chair/bed transfer  Transfers assist     Chair/bed transfer assist level: Minimal Assistance - Patient > 75%     Locomotion Ambulation   Ambulation assist      Assist level: Minimal Assistance - Patient > 75% Assistive device: No Device Max distance: 124f   Walk 10 feet activity   Assist     Assist level: Minimal Assistance - Patient > 75% Assistive device: No Device    Walk 50 feet activity   Assist    Assist level: Minimal Assistance - Patient > 75% Assistive device: No Device    Walk 150 feet activity   Assist Walk 150 feet activity did not occur: Safety/medical concerns(High fatigue level)  Assist level: Minimal Assistance - Patient > 75% Assistive device: No Device    Walk 10 feet on uneven surface  activity   Assist     Assist level: Maximal Assistance - Patient 25 - 49%(mod A for ramp, max A when ambulating over mulch) Assistive device: Other (comment)(no AD)   Wheelchair     Assist Will patient use wheelchair at discharge?: No             Wheelchair 50 feet with 2 turns activity    Assist            Wheelchair 150 feet activity     Assist          Blood pressure 114/65, pulse 71, temperature 98.9 F (37.2 C), temperature source Oral, resp. rate 16, height 6' (1.829 m), weight 84.4 kg, SpO2 98 %.    Medical Problem List and Plan: 1.  Impaired ADLs/mobility and function secondary to acute on chronic SDH with 5 mm of midline shift to left- with R hemiparesis and dysarthria- f/u with NSU after rehab; no head CT unless has a change in function, per NSU  Cont CIR  Repeat head CT on 10/12 reviewed-stable.  Will hold off on repeat MRI at present given what appeared to be fluctuations secondary to nonacute events.  Will consider repeat MRI if necessary,  not needed at this time.  Team conference today to discuss current and goals and coordination of care, home and environmental barriers, and discharge planning with nursing, case manager, and therapies.   Wife with questions, informed by nursing, will speak to wife after team conference. 2.  Antithrombotics: -DVT/anticoagulation:  Mechanical: Sequential compression devices, below knee Bilateral lower extremities and IVC filter             -antiplatelet therapy: N/A due to SDH 3. Pain Management:  Tylenol prn.  4. Mood: LCSW to follow for evaluation and  support.              -antipsychotic agents: N/A 5. Neuropsych: This patient is not fully capable of making decisions on his own behalf. 6. Skin/Wound Care: Routine pressure relief measures.  7. Fluids/Electrolytes/Nutrition: Strict I/O. Labs per HD protocol.   9. Proteus bacteremia/Recent aspiration PNA: Treated with Zosyn and transitioned to cipro on 9/24 for 120 days 10. Seizure disorder: On Vimpat and Lamictal bid.  Post HD on MWF needs additional doses of clonazepam 0.5 mg and Lamictal 150 mg to prevent breakthrough seizures.   No seizures from admission to CIR to 10/14 11. T2DM with hyperglycemia: Monitor BS ac/hs. Continue CBG (last 3)  Recent Labs    06/21/19 1625 06/21/19 2108 06/22/19 0603  GLUCAP 112* 112* 84   Slightly labile on 10/14 12. ESRD: Continue HD MWF at the end of the day to help with tolerance of therapy.  13. HTN: Monitor BP tid  Amlodipine increased to 7.5 daily  Continue metoprolol bid.   Relatively controlled on 10/14 14.  Anemia of chronic disease:  Hemoglobin 8.5 on 10/12 15. H/o A fib/PFO: Monitor HR tid--on metoprolol. Was on ASA only. No coumadin due to recurrent GIB and was on ASA--d/c due to SDH. Vitals:   06/21/19 2004 06/22/19 0541  BP: 114/64 114/65  Pulse: 76 71  Resp: 16 16  Temp: 98.5 F (36.9 C) 98.9 F (37.2 C)  SpO2: 98% 98%   Heart rate controlled on 10/14 16. H/o SS thalassemia/PE/DVT: IVC filter placed 09/2015.  17. 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 Multiple myeloma- was scheduled for autologous stem cell transplant at [facility]- will need f/u after rehab d/c. Labs also need to be monitored at least weekly for now. 19. B/L renal stones-Intermittent monitoring.  20. OSA on CPAP- CPAP as required for nighttime.  LOS: 8 days A FACE TO FACE EVALUATION WAS PERFORMED   Lorie Phenix 06/22/2019, 8:34 AM   HR controlled

## 2019-06-22 NOTE — Plan of Care (Signed)
  Problem: RH BOWEL ELIMINATION Goal: RH STG MANAGE BOWEL WITH ASSISTANCE Description: STG Manage Bowel with mod I Assistance. Outcome: Progressing   Problem: RH SAFETY Goal: RH STG ADHERE TO SAFETY PRECAUTIONS W/ASSISTANCE/DEVICE Description: STG Adhere to Safety Precautions With mod I Assistance/Device. Outcome: Progressing   Problem: RH COGNITION-NURSING Goal: RH STG ANTICIPATES NEEDS/CALLS FOR ASSIST W/ASSIST/CUES Description: STG Anticipates Needs/Calls for Assist With mod I Assistance/Cues. Outcome: Progressing   Problem: RH PAIN MANAGEMENT Goal: RH STG PAIN MANAGED AT OR BELOW PT'S PAIN GOAL Description: Less than 3 out of 10 Outcome: Progressing   Problem: RH KNOWLEDGE DEFICIT BRAIN INJURY Goal: RH STG INCREASE KNOWLEDGE OF SELF CARE AFTER BRAIN INJURY Description: Patient able to demonstrate management of medications to control complications associated with hematoma/seizures Outcome: Progressing

## 2019-06-22 NOTE — Progress Notes (Signed)
Physical Therapy Weekly Progress Note  Patient Details  Name: Isaiah [name] MRN: [id number] Date of Birth: Aug 19, 1966  Beginning of progress report period: June 15, 2019 End of progress report period: June 22, 2019  Today's Date: 06/22/2019 PT Individual Time: 0800-0900 PT Individual Time Calculation (min): 60 min   Patient has met 1 out of 10 long term goals and is currently progressing towards meeting all long term goals. Pt is able to complete bed mobility with supervision. Pt currently requires CGA/min A for transfers when fatigued, ambulates 170f without AD min A with poor RLE foot clearance, navigates 12 steps with 2 rails CGA. Pt reports that he does have a RW at home and doesn't need one.   Patient continues to demonstrate the following deficits muscle weakness and decreased standing balance and decreased postural control and therefore will continue to benefit from skilled PT intervention to increase functional independence with mobility.  Patient progressing toward long term goals.. Continue plan of care.  PT Short Term Goals Week 1:  PT Short Term Goal 1 (Week 1): =LTG due to ELOS PT Short Term Goal 1 - Progress (Week 1): Progressing toward goal Week 2:  PT Short Term Goal 1 (Week 2): STG = LTG  Skilled Therapeutic Interventions/Progress Updates:  Ambulation/gait training;Discharge planning;Functional mobility training;Therapeutic Activities;Balance/vestibular training;Disease management/prevention;Neuromuscular re-education;Therapeutic Exercise;Wheelchair propulsion/positioning;Cognitive remediation/compensation;DME/adaptive equipment instruction;Pain management;UE/LE Strength taining/ROM;Functional electrical stimulation;Community reintegration;Patient/family education;Stair training;UE/LE Coordination activities   Treatment Session: Received pt supine in bed, pt agreeable to PT and denied any pain during today's session. Session focused on functional mobility/transfers,  ambulation, stair navigation, motor control/balance, postural control, and improved tolerance to activity. Pt performed bed mobility with supervision with HOB elevated. Pt donned shirt sitting EOB with supervision and PT assisted donning shoes mod A for time management purposes. Pt ambulated 518fwith no AD min A to sink and performed oral hygiene and washed face with CGA. Pt ambulated [id number] therapy gym without AD min A. Pt continues to require verbal cues for increased step length on R. Pt ambulated [id number] AD min A to stairs. Pt navigated 12 steps with bilateral rails CGA with reciprocal pattern ascending and step to pattern when descending and verbal cues for RLE placement on step. In quadruped pt performed forward/backwards/lateral weight shifting x10 each direction CGA onto RUE with tactile and verbal cues to straighten RUE and accept weight. Pt performed LUE towel slides on mat in quadruped x 5 reps CGA to facilitate weight bearing on RUE. In quadruped pt also stacked and unstacked cones CGA with verbal cues for weight shifting onto RUE and to straighten RUE. Pt was fatigued after quadruped exercises as evidenced by muscle shaking and decrease in movement coordination. Pt ambulated [id number] AD to stairs min A. Pt navigated 12 steps with bilateral rails CGA ascending/descending with step to pattern due to fatigue, with same verbal cues. Pt performed standing ball toss min A with no UE support x10 with emphasis on RUE use. Concluded session with pt sitting in recliner, needs within reach, and chair pad alarm on.   Therapy Documentation Precautions:  Precautions Precautions: Fall Precaution Comments: R inattention Restrictions Weight Bearing Restrictions: No   Therapy/Group: Individual Therapy  Isaiah Minshall Alfonse AlpersDPT  06/22/2019, 7:54 AM

## 2019-06-22 NOTE — Progress Notes (Signed)
[facility] ROUNDING NOTE   Subjective:   This is a 53 year old gentleman with a history of diabetes end-stage renal disease Monday Wednesday Friday dialysis atrial fibrillation history of CVA 2017 history of thalassemia history of DVT/pulmonary embolus negative coagulopathy with IVC filter since 2017, history GIB, history of seizure disorders, history of multiple myeloma with recent admission to [facility] 05/26/2019 for stem cell transplant procedure was held due to findings of Proteus bacteremia.  He was also found to have aspiration pneumonia and acute on chronic subdural hemorrhage with bilateral renal stones also found.  He had progressive weakness slurred speech worsening right-sided facial weakness and droop he was found to have a CT scan of the head that showed 1.5 cm acute on chronic subdural hemorrhage with 4 mm midline shift.  Dr. Ellene Route was consulted and recommended conservative therapy.  He was also seen by neurology Dr. Leonel Ramsay who felt this was a low suspicion for neurological cause mild worsening right-sided weakness and follow-up CT scan of head 10 4 showed stable right subdural hemorrhage with stable mass-effect.  Mentation has continued to improve during hospitalization.  Blood pressure 114/65 pulse 71 temperature 98.6 O2 sats 98% room air  Sodium 134 potassium 4.5 chloride 94 CO2 25 BUN 60 creatinine 14 glucose 116 calcium 8.9 phosphorus 1.8 albumin 3.5 WBC 8. 7 hemoglobin 8.5 platelets 215  Acyclovir 400 mg nightly, amlodipine 7.5 mg nightly, PhosLo 3 tablets 3 times daily, Tums 400 mg 2 tablets at bedtime, Sensipar 60 mg daily, Cipro 500 mg daily, darbepoetin 60 mcg last administered 06/17/2019, Klonopin 0.5 mg Monday Wednesday Friday, insulin sliding scale, Vimpat 250 mg Monday Wednesday Friday, Lamictal 150 mg twice daily, metoprolol 12.5 mg twice daily, multivitamins 1 daily Protonix 40 mg daily   Objective:  Vital signs in last 24 hours:  Temp:  [97.7  F (36.5 C)-98.9 F (37.2 C)] 98.9 F (37.2 C) (10/14 0541) Pulse Rate:  [65-76] 71 (10/14 0541) Resp:  [16-18] 16 (10/14 0541) BP: (114-131)/(63-65) 114/65 (10/14 0541) SpO2:  [98 %-100 %] 98 % (10/14 0541) Weight:  [84.4 kg] 84.4 kg (10/14 0541)  Weight change: 3.2 kg Filed Weights   06/20/19 2259 06/21/19 0540 06/22/19 0541  Weight: 81.2 kg 84.9 kg 84.4 kg    Intake/Output: I/O last 3 completed shifts: In: 840 [P.O.:840] Out: 2000 [Other:2000]   Intake/Output this shift:  No intake/output data recorded.  General:NAD, WNWD male, sitting in bedside chair Heart:RRR, no mrg Lungs:CTAB Abdomen:soft, NTND Extremities:no LE edema Dialysis Access: R AVF +b/t   Basic Metabolic Panel: Recent Labs  Lab 06/15/19 1357 06/16/19 1202 06/17/19 1338 06/20/19 1848  NA 137 137 136 134*  K 4.8 4.5 4.6 4.5  CL 97* 97* 96* 94*  CO2 26 29 27 25   GLUCOSE 145* 135* 151* 116*  BUN 44* 26* 43* 60*  CREATININE 11.18* 8.74* 11.32* 14.01*  CALCIUM 8.7* 8.6* 8.9 8.9  PHOS 2.6 2.5 2.2* 1.8*    Liver Function Tests: Recent Labs  Lab 06/15/19 1357 06/16/19 1202 06/17/19 1338 06/20/19 1848  ALBUMIN 3.4* 3.5 3.4* 3.5   No results for input(s): LIPASE, AMYLASE in the last 168 hours. No results for input(s): AMMONIA in the last 168 hours.  CBC: Recent Labs  Lab 06/15/19 1357 06/16/19 1202 06/17/19 1338 06/20/19 1849  WBC 7.0 6.3 7.5 8.7  HGB 9.0* 9.8* 8.5* 8.5*  HCT RESULTS UNAVAILABLE DUE TO INTERFERING SUBSTANCE 26.1* RESULTS UNAVAILABLE DUE TO INTERFERING SUBSTANCE 22.7*  MCV RESULTS UNAVAILABLE DUE TO INTERFERING  SUBSTANCE 92.2 RESULTS UNAVAILABLE DUE TO INTERFERING SUBSTANCE 88.3  PLT 288 291 249 215    Cardiac Enzymes: No results for input(s): CKTOTAL, CKMB, CKMBINDEX, TROPONINI in the last 168 hours.  BNP: Invalid input(s): POCBNP  CBG: Recent Labs  Lab 06/21/19 0617 06/21/19 1144 06/21/19 1625 06/21/19 2108 06/22/19 0603  GLUCAP 86 133* 112* 112* 84     Microbiology: Results for orders placed or performed during the hospital encounter of 06/09/19  SARS Coronavirus 2 [facility] order, Performed in [facility] hospital lab) Nasopharyngeal Nasopharyngeal Swab     Status: None   Collection Time: 06/09/19 10:47 AM   Specimen: Nasopharyngeal Swab  Result Value Ref Range Status   SARS Coronavirus 2 NEGATIVE NEGATIVE Final    Comment: (NOTE) If result is NEGATIVE SARS-CoV-2 target nucleic acids are NOT DETECTED. The SARS-CoV-2 RNA is generally detectable in upper and lower  respiratory specimens during the acute phase of infection. The lowest  concentration of SARS-CoV-2 viral copies this assay can detect is 250  copies / mL. A negative result does not preclude SARS-CoV-2 infection  and should not be used as the sole basis for treatment or other  patient management decisions.  A negative result may occur with  improper specimen collection / handling, submission of specimen other  than nasopharyngeal swab, presence of viral mutation(s) within the  areas targeted by this assay, and inadequate number of viral copies  (<250 copies / mL). A negative result must be combined with clinical  observations, patient history, and epidemiological information. If result is POSITIVE SARS-CoV-2 target nucleic acids are DETECTED. The SARS-CoV-2 RNA is generally detectable in upper and lower  respiratory specimens dur ing the acute phase of infection.  Positive  results are indicative of active infection with SARS-CoV-2.  Clinical  correlation with patient history and other diagnostic information is  necessary to determine patient infection status.  Positive results do  not rule out bacterial infection or co-infection with other viruses. If result is PRESUMPTIVE POSTIVE SARS-CoV-2 nucleic acids MAY BE PRESENT.   A presumptive positive result was obtained on the submitted specimen  and confirmed on repeat testing.  While 2019 novel coronavirus   (SARS-CoV-2) nucleic acids may be present in the submitted sample  additional confirmatory testing may be necessary for epidemiological  and / or clinical management purposes  to differentiate between  SARS-CoV-2 and other Sarbecovirus currently known to infect humans.  If clinically indicated additional testing with an alternate test  methodology [phone]) is advised. The SARS-CoV-2 RNA is generally  detectable in upper and lower respiratory sp ecimens during the acute  phase of infection. The expected result is Negative. Fact Sheet for Patients:  StrictlyIdeas.no Fact Sheet for Healthcare Providers: BankingDealers.co.za This test is not yet approved or cleared by the [place] FDA and has been authorized for detection and/or diagnosis of SARS-CoV-2 by FDA under an Emergency Use Authorization (EUA).  This EUA will remain in effect (meaning this test can be used) for the duration of the COVID-19 declaration under Section 564(b)(1) of the Act, 21 U.S.C. section 360bbb-3(b)(1), unless the authorization is terminated or revoked sooner. Performed at [facility], [place] [address], [place], [place] [address]   Blood culture (routine x 2)     Status: None   Collection Time: 06/09/19 10:47 AM   Specimen: BLOOD  Result Value Ref Range Status   Specimen Description BLOOD UNKNOWN  Final   Special Requests   Final    BOTTLES DRAWN AEROBIC  AND ANAEROBIC Blood Culture results may not be optimal due to an inadequate volume of blood received in culture bottles   Culture   Final    NO GROWTH 5 DAYS Performed at [facility] [address] [place], [place] [address]    Report Status 06/14/2019 FINAL  Final  Blood culture (routine x 2)     Status: None   Collection Time: 06/09/19  2:22 PM   Specimen: BLOOD  Result Value Ref Range Status   Specimen Description BLOOD LEFT ANTECUBITAL  Final   Special Requests   Final    BOTTLES  DRAWN AEROBIC ONLY Blood Culture results may not be optimal due to an inadequate volume of blood received in culture bottles   Culture   Final    NO GROWTH 5 DAYS Performed at [facility], [place] [address] [place], [place] [address]    Report Status 06/14/2019 FINAL  Final    Coagulation Studies: No results for input(s): LABPROT, INR in the last 72 hours.  Urinalysis: No results for input(s): COLORURINE, LABSPEC, PHURINE, GLUCOSEU, HGBUR, BILIRUBINUR, KETONESUR, PROTEINUR, UROBILINOGEN, NITRITE, LEUKOCYTESUR in the last 72 hours.  Invalid input(s): APPERANCEUR    Imaging: Ct Head Wo Contrast  Result Date: 06/20/2019 CLINICAL DATA:  Neuro deficit(s), subacute. Additional history: Patient was working with therapy and had sudden decrease in mobility, minimal assist this morning, now max assist, patient reports slight blurred vision as well as nausea earlier. EXAM: CT HEAD WITHOUT CONTRAST TECHNIQUE: Contiguous axial images were obtained from the base of the skull through the vertex without intravenous contrast. COMPARISON:  Head CT 06/12/2019, brain MRI 01/07/2016 FINDINGS: Brain: Redemonstrated right subdural collection, greatest overlying the frontoparietal lobes, and measuring up to 1.5 cm in thickness (series 5, image 37). As before, the collection is predominantly intermediate density. However, there are subtle persistent internal foci of hyperdensity which may reflect small components of acute/subacute blood products and/or septations. There is mass effect upon the underlying right cerebral hemisphere. Partial effacement of the right lateral ventricle. 4 mm leftward midline shift. Again demonstrated are chronic infarcts within the left frontal lobe, right frontal and parietal lobes, right temporal occipital lobe, and bilateral cerebellar hemispheres. No new infarct is definitively identified. Ill-defined hypoattenuation of the cerebral white matter is nonspecific, but consistent with  chronic small vessel ischemic disease. No evidence of intracranial mass. Mild generalized parenchymal atrophy. Partially empty sella turcica. Vascular: No hyperdense vessel. Atherosclerotic calcification of the carotid artery siphons and vertebrobasilar system. Skull: No calvarial fracture Sinuses/Orbits: Visualized orbits demonstrate no acute abnormality. Mild scattered paranasal sinus mucosal thickening. Moderate-sized left maxillary sinus mucous retention cyst. No significant mastoid effusion. These results were called by telephone at the time of interpretation on 06/20/2019 at 3:26 pm to provider [facility] , who verbally acknowledged these results. IMPRESSION: 1. Mixed density right subdural hematoma not significantly changed as compared to head CT 06/12/2019. Stable mass effect on the right cerebral hemisphere with 4 mm leftward midline shift. 2. Multiple redemonstrated chronic infarcts as detailed. No new infarct is identified, although extensive chronic changes somewhat limit evaluation. Consider brain MRI for further evaluation, if clinically warranted. 3. Generalized atrophy with chronic small vessel ischemic disease. Electronically Signed   By: Kellie Simmering   On: 06/20/2019 15:26     Medications:   . sodium chloride    . sodium chloride     . acyclovir  400 mg Oral QHS  . amLODipine  7.5 mg Oral QHS  .  calcium carbonate  2 tablet Oral QHS  . Chlorhexidine Gluconate Cloth  6 each Topical Q0600  . Chlorhexidine Gluconate Cloth  6 each Topical Q0600  . cinacalcet  60 mg Oral QPM  . ciprofloxacin  500 mg Oral QPM  . clonazePAM  0.5 mg Oral Q M,W,F-HD  . darbepoetin (ARANESP) injection - DIALYSIS  60 mcg Intravenous Q Fri-HD  . fluticasone furoate-vilanterol  1 puff Inhalation Daily  . insulin aspart  0-5 Units Subcutaneous QHS  . insulin aspart  0-9 Units Subcutaneous TID WC  . lacosamide  150 mg Oral BID  . lacosamide  150 mg Oral Q M,W,F-2000  . lamoTRIgine  150 mg Oral BID  .  metoprolol tartrate  12.5 mg Oral BID  . multivitamin  1 tablet Oral QHS  . pantoprazole  40 mg Oral Daily   sodium chloride, sodium chloride, acetaminophen, bisacodyl, calcium carbonate, camphor-menthol **AND** hydrOXYzine, diphenhydrAMINE, docusate sodium, feeding supplement (NEPRO CARB STEADY), guaiFENesin-dextromethorphan, prochlorperazine **OR** prochlorperazine **OR** prochlorperazine, prochlorperazine, senna-docusate, simethicone, sorbitol, traZODone, zolpidem  Assessment/ Plan:   ESRD-Monday Wednesday Friday dialysis no heparin secondary to subdural hemorrhage.  Appeared to tolerate dialysis well 06/20/2019 with 2 L ultrafiltration.  Plan next dialysis 06/22/2019  Altered mental status/acute on chronic subdural hemorrhage followed by neurology neurosurgery no surgical intervention.  History of Proteus bacteremia currently using Cipro has nephrolithiasis with possibility that this could be potential source.  Seizure disorders Vimpat ,Lamictal and Klonopin with dialysis.  Hypophosphatemia will hold calcium phosphate at this time  Hypertension well-controlled now on amlodipine and Lopressor  Diabetes as per primary team  Multiple myeloma recent stem cell transplant planned and aborted secondary to subdural hemorrhage and bacteremia  Secondary hyperparathyroidism calcium phosphorus within goal continue binders vitamin D analogs and Sensipar  Anemia continues on darbepoetin 60 mcg dose every Friday  Disposition continue on rehab   LOS: [place] @TODAY @7 :39 AM

## 2019-06-22 NOTE — Progress Notes (Signed)
Occupational Therapy Session Note  Patient Details  Name: [place] MRN: [id number] Date of Birth: February 17, 1966  Today's Date: 06/22/2019 OT Individual Time: 1030-1056 OT Individual Time Calculation (min): 26 min    Short Term Goals: Week 2:  OT Short Term Goal 1 (Week 2): Continue working on established supervision level goals set for long term..  Skilled Therapeutic Interventions/Progress Updates:    patient seated in recliner, states that he just wants to go to home, tearful, spoke with wife who offered encouragement. He is refusing functional activity, games/puzzles and exercises as offered.  Provided word search, he held marker in right hand but did not attempt activity.  Sit to stand and SPT with RW recliner to bed with CGA.  SSP to supine with min A.  Patient in bed at close of session with bed alarm set and call bell in reach.    Therapy Documentation Precautions:  Precautions Precautions: Fall Precaution Comments: R inattention Restrictions Weight Bearing Restrictions: No General: General OT Amount of Missed Time: 30 Minutes Vital Signs:   Pain: Pain Assessment Pain Scale: 0-10 Pain Score: 0-No pain Other Treatments:     Therapy/Group: Individual Therapy  Carlos Levering 06/22/2019, 12:04 PM

## 2019-06-22 NOTE — Progress Notes (Signed)
Physical Therapy Session Note  Patient Details  Name: Isaiah [name] [facility] MRN: [id number] Date of Birth: [birth year]  Today's Date: 06/22/2019  Patient in bed upon PT arrival. Patient declined working with therapy despite encouragement. As PT was inquiring about patient's reasoning for declining therapy transport arrived to take the patient to HD. 60 min of skilled PT miss due to HD.     Yochanan Eddleman L Keirsten Matuska PT, DPT  06/22/2019, 10:10 AM

## 2019-06-22 NOTE — Progress Notes (Signed)
Occupational Therapy Note  Patient Details  Name: Isaiah [name] [facility] MRN: [id number] Date of Birth: [birth year]  Pt declined participation in OT session.  He remained supine in the bed and would only respond occasionally to therapist that he wanted to go home.  Therapist discussed team meeting prior to session and the the plan was for him to go home next Thursday if he continues progressing like he has.  Pt would not respond further or initiate to command, keeping eyes closed with slight tearfulness noted.  Nursing made aware and pt left in bed with call button and phone in reach and bed alarm in place.  Pt missed 60 mins OT.    Wynelle Dreier OTR/L 06/22/2019, 11:56 AM

## 2019-06-22 NOTE — Progress Notes (Signed)
Patient taken to HD for treatment by transport team.  Brita Romp, RN

## 2019-06-23 ENCOUNTER — Telehealth: Payer: Self-pay

## 2019-06-23 ENCOUNTER — Telehealth: Payer: Self-pay | Admitting: Neurology

## 2019-06-23 ENCOUNTER — Inpatient Hospital Stay (HOSPITAL_COMMUNITY): Payer: 59

## 2019-06-23 ENCOUNTER — Inpatient Hospital Stay (HOSPITAL_COMMUNITY): Payer: 59 | Admitting: Speech Pathology

## 2019-06-23 ENCOUNTER — Inpatient Hospital Stay (HOSPITAL_COMMUNITY): Payer: 59 | Admitting: Occupational Therapy

## 2019-06-23 LAB — GLUCOSE, CAPILLARY
Glucose-Capillary: 117 mg/dL — ABNORMAL HIGH (ref 70–99)
Glucose-Capillary: 124 mg/dL — ABNORMAL HIGH (ref 70–99)
Glucose-Capillary: 113 mg/dL — ABNORMAL HIGH (ref 70–99)
Glucose-Capillary: 120 mg/dL — ABNORMAL HIGH (ref 70–99)

## 2019-06-23 MED ORDER — CHLORHEXIDINE GLUCONATE CLOTH 2 % EX PADS: 6.0000 | MEDICATED_PAD | Freq: Every day | CUTANEOUS | Status: DC

## 2019-06-23 NOTE — Progress Notes (Signed)
Occupational Therapy Session Note  Patient Details  Name: Isaiah [name] MRN: [id number] Date of Birth: Mar 18, 1966  Today's Date: 06/23/2019 OT Individual Time: 0904-1001 OT Individual Time Calculation (min): 57 min    Short Term Goals: Week 2:  OT Short Term Goal 1 (Week 2): Continue working on established supervision level goals set for long term..  Skilled Therapeutic Interventions/Progress Updates:    Pt in bed to start session, pleasant and alert.  He was able to transfer to the EOB with supervision. Prior to getting up, he removed his gripper socks and needed cueing to not remove them until after he gets to the shower.  He then got up with min assist and use of the RW.  Max instructional cueing needed for pushing up from the surface during transfers instead of holding onto the walker.  He ambulated to gather his clothing with min questioning cueing.  He then completed grooming tasks of shaving, washing his face, and brushing his teeth with min guard assist.  He then ambulated to the shower with min assist for bathing.  Min guard assist for all bathing sit to stand with one instructional cue to not stand up with soap on his feet.  He transferred to the EOB with min assist and mod instructional cueing for staying inside of the walker.  He was able to complete all dressing with overall min assist sit to stand.  Min assist to transfer over to the bedside recliner with use of the walker to complete session.  Call button and phone in reach with safety alarm pad in place.    Therapy Documentation Precautions:  Precautions Precautions: Fall Precaution Comments: R inattention Restrictions Weight Bearing Restrictions: No  Pain: Pain Assessment Pain Scale: Faces Pain Score: 0-No pain ADL: See Care Tool Section for some details of ADL and mobility  Therapy/Group: Individual Therapy  Isaiah [name] OTR/L 06/23/2019, 10:17 AM

## 2019-06-23 NOTE — Patient Care Conference (Signed)
Inpatient RehabilitationTeam Conference and Plan of Care Update Date: 06/22/2019   Time: 10:35 AM    Patient Name: Isaiah [name]      Medical Record Number: [id number]  Date of Birth: [birth year] Sex: Male         Room/Bed: 4M04C/4M04C-01 Payor Info: Payor: MEDICARE / Plan: MEDICARE PART A AND B / Product Type: *No Product type* /    Admit Date/Time:  06/14/2019  4:57 PM  Primary Diagnosis:  <principal problem not specified>  Patient Active Problem List   Diagnosis Date Noted  . PAF (paroxysmal atrial fibrillation) ([place])   . Bacteremia   . Labile blood pressure   . Labile blood glucose   . Controlled type 2 diabetes mellitus with hyperglycemia, without long-term current use of insulin ([place])   . ESRD on dialysis ([place])   . Right sided weakness   . SDH (subdural hematoma) ([place])   . Acute on chronic intracranial subdural hematoma (HCC) 06/09/2019  . Multiple myeloma not having achieved remission ([place]) 06/09/2019  . ESRD on hemodialysis ([place]) 06/09/2019  . Essential hypertension 06/09/2019  . DM type 2 (diabetes mellitus, type 2) ([place]) 06/09/2019  . Seizure disorder ([place]) 06/09/2019  . Aspiration pneumonia ([place]) 06/09/2019  . History of bacteremia 06/09/2019  . Atrial fibrillation, chronic ([place]) 06/09/2019  . Multiple myeloma ([place]) 05/17/2018  . Goals of care, counseling/discussion 05/17/2018  . Partial idiopathic epilepsy with seizures of localized onset, intractable, without status epilepticus ([place]) 11/02/2017  . Iron deficiency anemia, unspecified 01/22/2017  . Hemiparesis affecting right side as late effect of cerebrovascular accident ([place]) 02/28/2016  . Aphasia complicating stroke 07/11/1593  . Seizures ([place]) 01/08/2016  . History of ischemic left MCA stroke 01/08/2016  . Right sided weakness 01/08/2016  . Atrial fibrillation ([place]) 01/08/2016  . Leukocytosis 01/08/2016  . Benign essential HTN 01/08/2016  . Anemia of chronic disease 01/08/2016  . Controlled type 2 diabetes  mellitus with diabetic nephropathy, without long-term current use of insulin ([place]) 01/08/2016  . History of DVT (deep vein thrombosis) 01/08/2016  . ESRD (end stage renal disease) on dialysis [facility])     Expected Discharge Date: Expected Discharge Date: 06/30/19  Team Members Present: Physician leading conference: Dr. Delice Lesch Social Worker Present: Ovidio Kin, LCSW Nurse Present: Rayetta Pigg, RN PT Present: Becky Sax, PT;Emily Benn [place]oulder, PT OT Present: Clyda Greener, OT SLP Present: Stormy Fabian, SLP PPS Coordinator present : Gunnar Fusi, SLP     Current Status/Progress Goal Weekly Team Focus  Bowel/Bladder   anuric; continent of bowel, LBM: 10/11  prn laxatives; gain regular bowel pattern  assist with toleiting needs prn   Swallow/Nutrition/ Hydration             ADL's   Min assist for transfers with use of the RW, but variable, has been mod to max secondary to forward lean.  Supervision for UB selfcare with min assist for LB selfcare.  Decreased cognitive sequencing as well as decreased awareness.  Still with RUE paresis from previous CVA  S overall  selfcare retraining, transfer training, cognitive retraining, pt/family education, therapeutic activities, neuromuscular re-education   Mobility   mod assist overall for mobility without an AD up to 150 ft, pt can require up to total assist to correct LOB with higher level balance activities  supervision, CGA  balance, steps, gait, transfers, education, d/c planning   Communication   Min A  Min-Supervison A  speech intelligibility strategies, awareness of errors, thought organization and education  Safety/Cognition/ Behavioral Observations  Mod-Min A  Mod-Min A, Supervision A sustained attention  basic problem solving, emergent awareness, recall strategies and sustained attention   Pain   no c/o pain  remain pain free  assess pain level QS and prn   Skin   skin intact  maintain skin intergrity  assess ksin QS and  prn      *See Care Plan and progress notes for long and short-term goals.     Barriers to Discharge  Current Status/Progress Possible Resolutions Date Resolved   Nursing                  PT                    OT                  SLP                SW                Discharge Planning/Teaching Needs:  HOme with wife and son who is taking on-line college classes. Wife works during the day. Will need family education prior to DC home.      Team Discussion:  Continuing to make progress in therapies. Currently min assist level with goals of supervision/CGA due to cognition and needing to be cued. Working on poor clearance of right foot and awareness issues. Wife aware recommendation is 24 hr supervision due to safety concerns.   Revisions to Treatment Plan:  DC 10/22    Medical Summary Current Status: Impaired ADLs/mobility and function secondary to acute on chronic SDH with 5 mm of midline shift to left- with R hemiparesis and dysarthria Weekly Focus/Goal: Improve mobility, safety, cognition, HTN, DM, bacteremia  Barriers to Discharge: Medical stability;Other (comments)  Barriers to Discharge Comments: Bacteremia Possible Resolutions to Barriers: Therapies, optimize DM/HTN meds, abx for bacteremia   Continued Need for Acute Rehabilitation Level of Care: The patient requires daily medical management by a physician with specialized training in physical medicine and rehabilitation for the following reasons: Direction of a multidisciplinary physical rehabilitation program to maximize functional independence : Yes Medical management of patient stability for increased activity during participation in an intensive rehabilitation regime.: Yes Analysis of laboratory values and/or radiology reports with any subsequent need for medication adjustment and/or medical intervention. : Yes   I attest that I was present, lead the team conference, and concur with the assessment and plan of the  team. Team conference was held via web/ teleconference due to [place], Gardiner Rhyme 06/23/2019, 8:46 AM

## 2019-06-23 NOTE — Progress Notes (Signed)
[place] PHYSICAL MEDICINE & REHABILITATION PROGRESS NOTE   Subjective/Complaints: Patient seen sitting up in bed this morning working with therapies.  He states he slept well overnight.  Discussed status, progress, discharge with wife yesterday.  He was seen by nephro yesterday, notes reviewed.  Also discussed with therapies anterior lean, which appears to be improving.  ROS: Denies CP, SOB, N/V/D  Objective: No results found. Recent Labs    06/20/19 1849 06/22/19 1420  WBC 8.7 9.1  HGB 8.5* 8.6*  HCT 22.7* RESULTS UNAVAILABLE DUE TO INTERFERING SUBSTANCE  PLT 215 226   Recent Labs    06/20/19 1848 06/22/19 1421  NA 134* 137  K 4.5 4.1  CL 94* 96*  CO2 25 26  GLUCOSE 116* 123*  BUN 60* 41*  CREATININE 14.01* 11.36*  CALCIUM 8.9 8.1*    Intake/Output Summary (Last 24 hours) at 06/23/2019 0855 Last data filed at 06/22/2019 1730 Gross per 24 hour  Intake 360 ml  Output 2000 ml  Net -1640 ml     Physical Exam: Vital Signs Blood pressure 122/76, pulse 70, temperature 97.9 F (36.6 C), temperature source Oral, resp. rate 16, height 6' (1.829 m), weight 81.6 kg, SpO2 95 %. Constitutional: No distress . Vital signs reviewed. HENT: Normocephalic.  Atraumatic. Eyes: EOMI. No discharge. Cardiovascular: No JVD. Respiratory: Normal effort.  No stridor. GI: Non-distended. Skin: Warm and dry.  Intact. Psych: Normal mood.  Normal behavior. Musc: No edema in extremities.  No tenderness in extremities. Neurologic: Alert Dysarthria Facial weakness Motor: LUE/LLE: 5/5 proximal distal RUE: 4+/5 proximally, 4/5 right hand RLE: 4+/5 proximal distal   Assessment/Plan: 1. Functional deficits secondary to SDH which require 3+ hours per day of interdisciplinary therapy in a comprehensive inpatient rehab setting.  Physiatrist is providing close team supervision and 24 hour management of active medical problems listed below.  Physiatrist and rehab team continue to assess  barriers to discharge/monitor patient progress toward functional and medical goals  Care Tool:  Bathing    Body parts bathed by patient: Right arm, Left arm, Chest, Abdomen, Face, Left lower leg, Right lower leg, Left upper leg, Front perineal area, Buttocks, Right upper leg   Body parts bathed by helper: Left lower leg, Right lower leg, Left upper leg, Right upper leg, Buttocks, Front perineal area, Abdomen, Chest, Left arm, Right arm Body parts n/a: Front perineal area, Buttocks, Right upper leg, Left lower leg, Right lower leg, Left upper leg(Pt declined completing secondary to stating it was done earlier)   Bathing assist Assist Level: Minimal Assistance - Patient > 75%     Upper Body Dressing/Undressing Upper body dressing Upper body dressing/undressing activity did not occur (including orthotics): N/A What is the patient wearing?: Pull over shirt    Upper body assist Assist Level: Supervision/Verbal cueing    Lower Body Dressing/Undressing Lower body dressing      What is the patient wearing?: Pants, Underwear/pull up, Incontinence brief     Lower body assist Assist for lower body dressing: Minimal Assistance - Patient > 75%     Toileting Toileting    Toileting assist Assist for toileting: Minimal Assistance - Patient > 75%     Transfers Chair/bed transfer  Transfers assist     Chair/bed transfer assist level: Minimal Assistance - Patient > 75%     Locomotion Ambulation   Ambulation assist      Assist level: Minimal Assistance - Patient > 75% Assistive device: No Device Max distance: 150f   Walk 10  feet activity   Assist     Assist level: Minimal Assistance - Patient > 75% Assistive device: No Device   Walk 50 feet activity   Assist    Assist level: Minimal Assistance - Patient > 75% Assistive device: No Device    Walk 150 feet activity   Assist Walk 150 feet activity did not occur: Safety/medical concerns(High fatigue  level)  Assist level: Minimal Assistance - Patient > 75% Assistive device: No Device    Walk 10 feet on uneven surface  activity   Assist     Assist level: Maximal Assistance - Patient 25 - 49%(mod A for ramp, max A when ambulating over mulch) Assistive device: Other (comment)(no AD)   Wheelchair     Assist Will patient use wheelchair at discharge?: No             Wheelchair 50 feet with 2 turns activity    Assist            Wheelchair 150 feet activity     Assist          Blood pressure 122/76, pulse 70, temperature 97.9 F (36.6 C), temperature source Oral, resp. rate 16, height 6' (1.829 m), weight 81.6 kg, SpO2 95 %.    Medical Problem List and Plan: 1.  Impaired ADLs/mobility and function secondary to acute on chronic SDH with 5 mm of midline shift to left- with R hemiparesis and dysarthria- f/u with NSU after rehab; no head CT unless has a change in function, per NSU  Continue CIR  Repeat head CT on 10/12 reviewed-stable.  Will hold off on repeat MRI at present given what appeared to be fluctuations secondary to nonacute events.  Will consider repeat MRI if necessary, not needed at this time.  Updated wife on 10/14 2.  Antithrombotics: -DVT/anticoagulation:  Mechanical: Sequential compression devices, below knee Bilateral lower extremities and IVC filter             -antiplatelet therapy: N/A due to SDH 3. Pain Management:  Tylenol prn.  4. Mood: LCSW to follow for evaluation and support.              -antipsychotic agents: N/A 5. Neuropsych: This patient is not fully capable of making decisions on his own behalf. 6. Skin/Wound Care: Routine pressure relief measures.  7. Fluids/Electrolytes/Nutrition: Strict I/O. Labs per HD protocol.   9. Proteus bacteremia/Recent aspiration PNA: Treated with Zosyn and transitioned to cipro on 9/24 for 120 days total 10. Seizure disorder: On Vimpat and Lamictal bid.  Post HD on MWF needs additional doses of  clonazepam 0.5 mg and Lamictal 150 mg to prevent breakthrough seizures.   No seizures from admission to CIR to 10/15 11. T2DM with hyperglycemia: Monitor BS ac/hs. Continue CBG (last 3)  Recent Labs    06/22/19 1822 06/22/19 2112 06/23/19 0619  GLUCAP 82 112* 117*   Slightly labile on 10/15, monitor for hypoglycemia 12. ESRD: Continue HD MWF at the end of the day to help with tolerance of therapy.  13. HTN: Monitor BP tid  Amlodipine increased to 7.5 daily  Continue metoprolol bid.   Relatively controlled on 10/15 14.  Anemia of chronic disease:  Hemoglobin 8.6 on 10/14 15. H/o A fib/PFO: Monitor HR tid--on metoprolol. Was on ASA only. No coumadin due to recurrent GIB and was on ASA--d/c due to SDH. Vitals:   06/23/19 0728 06/23/19 0802  BP:  122/76  Pulse: 73 70  Resp: 16   Temp:  SpO2: 95%    Heart rate controlled on 10/15 16. H/o SS thalassemia/PE/DVT: IVC filter placed 09/2015.  17. 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 Multiple myeloma- was scheduled for autologous stem cell transplant at [facility]- will need f/u after rehab d/c. Labs also need to be monitored at least weekly for now. 19. B/L renal stones-Intermittent monitoring.  20. OSA on CPAP- CPAP as required for nighttime.  LOS: 9 days A FACE TO FACE EVALUATION WAS PERFORMED  Ankit Lorie Phenix 06/23/2019, 8:55 AM

## 2019-06-23 NOTE — Progress Notes (Signed)
[facility] ROUNDING NOTE   Subjective:   This is a 53 year old gentleman with a history of diabetes end-stage renal disease Monday Wednesday Friday dialysis atrial fibrillation history of CVA 2017 history of thalassemia history of DVT/pulmonary embolus negative coagulopathy with IVC filter since 2017, history GIB, history of seizure disorders, history of multiple myeloma with recent admission to [name] 05/26/2019 for stem cell transplant procedure was held due to findings of Proteus bacteremia.  He was also found to have aspiration pneumonia and acute on chronic subdural hemorrhage with bilateral renal stones also found.  He had progressive weakness slurred speech worsening right-sided facial weakness and droop he was found to have a CT scan of the head that showed 1.5 cm acute on chronic subdural hemorrhage with 4 mm midline shift.  Dr. Ellene Route was consulted and recommended conservative therapy.  He was also seen by neurology Dr. Leonel Ramsay who felt this was a low suspicion for neurological cause mild worsening right-sided weakness and follow-up CT scan of head 10 4 showed stable right subdural hemorrhage with stable mass-effect.  Mentation has continued to improve during hospitalization.  Blood pressure 122/76 pulse 70 temperature 97.9 O2 sats 95% room air  Sodium 137 potassium 4.1 chloride 96 CO2 26 BUN 41 creatinine 11.36 glucose 123 calcium 8.1 phosphorus 2.0 albumin 3.4  Ultrafiltration with removal of 2 L 06/22/2019  Acyclovir 400 mg nightly, amlodipine 7.5 mg nightly, PhosLo 3 tablets 3 times daily, Tums 400 mg 2 tablets at bedtime, Sensipar 60 mg daily, Cipro 500 mg daily, darbepoetin 60 mcg last administered 06/17/2019, Klonopin 0.5 mg Monday Wednesday Friday, insulin sliding scale, Vimpat 250 mg Monday Wednesday Friday, Lamictal 150 mg twice daily, metoprolol 12.5 mg twice daily, multivitamins 1 daily Protonix 40 mg daily   Objective:  Vital signs in last 24 hours:   Temp:  [97.5 F (36.4 C)-98.1 F (36.7 C)] 97.9 F (36.6 C) (10/15 0357) Pulse Rate:  [65-79] 70 (10/15 0802) Resp:  [14-17] 16 (10/15 0728) BP: (105-140)/(55-78) 122/76 (10/15 0802) SpO2:  [95 %-100 %] 95 % (10/15 0728) Weight:  [81.6 kg-84.5 kg] 81.6 kg (10/15 0357)  Weight change: 0.1 kg Filed Weights   06/22/19 1330 06/22/19 1730 06/23/19 0357  Weight: 84.5 kg 82.5 kg 81.6 kg    Intake/Output: I/O last 3 completed shifts: In: 360 [P.O.:360] Out: 2000 [Other:2000]   Intake/Output this shift:  No intake/output data recorded.  General:NAD, WNWD male, sitting in bedside chair Heart:RRR, no mrg Lungs:CTAB Abdomen:soft, NTND Extremities:no LE edema Dialysis Access: R AVF +b/t   Basic Metabolic Panel: Recent Labs  Lab 06/16/19 1202 06/17/19 1338 06/20/19 1848 06/22/19 1421  NA 137 136 134* 137  K 4.5 4.6 4.5 4.1  CL 97* 96* 94* 96*  CO2 29 27 25 26   GLUCOSE 135* 151* 116* 123*  BUN 26* 43* 60* 41*  CREATININE 8.74* 11.32* 14.01* 11.36*  CALCIUM 8.6* 8.9 8.9 8.1*  PHOS 2.5 2.2* 1.8* 2.0*    Liver Function Tests: Recent Labs  Lab 06/16/19 1202 06/17/19 1338 06/20/19 1848 06/22/19 1421  ALBUMIN 3.5 3.4* 3.5 3.4*   No results for input(s): LIPASE, AMYLASE in the last 168 hours. No results for input(s): AMMONIA in the last 168 hours.  CBC: Recent Labs  Lab 06/16/19 1202 06/17/19 1338 06/20/19 1849 06/22/19 1420  WBC 6.3 7.5 8.7 9.1  HGB 9.8* 8.5* 8.5* 8.6*  HCT 26.1* RESULTS UNAVAILABLE DUE TO INTERFERING SUBSTANCE 22.7* RESULTS UNAVAILABLE DUE TO INTERFERING SUBSTANCE  MCV 92.2 RESULTS UNAVAILABLE  DUE TO INTERFERING SUBSTANCE 88.3 RESULTS UNAVAILABLE DUE TO INTERFERING SUBSTANCE  PLT 291 249 215 226    Cardiac Enzymes: No results for input(s): CKTOTAL, CKMB, CKMBINDEX, TROPONINI in the last 168 hours.  BNP: Invalid input(s): POCBNP  CBG: Recent Labs  Lab 06/22/19 0603 06/22/19 1127 06/22/19 1822 06/22/19 2112 06/23/19 0619  GLUCAP 84  167* 82 112* 117*    Microbiology: Results for orders placed or performed during the hospital encounter of 06/09/19  SARS Coronavirus 2 [facility] order, Performed in [facility] hospital lab) Nasopharyngeal Nasopharyngeal Swab     Status: None   Collection Time: 06/09/19 10:47 AM   Specimen: Nasopharyngeal Swab  Result Value Ref Range Status   SARS Coronavirus 2 NEGATIVE NEGATIVE Final    Comment: (NOTE) If result is NEGATIVE SARS-CoV-2 target nucleic acids are NOT DETECTED. The SARS-CoV-2 RNA is generally detectable in upper and lower  respiratory specimens during the acute phase of infection. The lowest  concentration of SARS-CoV-2 viral copies this assay can detect is 250  copies / mL. A negative result does not preclude SARS-CoV-2 infection  and should not be used as the sole basis for treatment or other  patient management decisions.  A negative result may occur with  improper specimen collection / handling, submission of specimen other  than nasopharyngeal swab, presence of viral mutation(s) within the  areas targeted by this assay, and inadequate number of viral copies  (<250 copies / mL). A negative result must be combined with clinical  observations, patient history, and epidemiological information. If result is POSITIVE SARS-CoV-2 target nucleic acids are DETECTED. The SARS-CoV-2 RNA is generally detectable in upper and lower  respiratory specimens dur ing the acute phase of infection.  Positive  results are indicative of active infection with SARS-CoV-2.  Clinical  correlation with patient history and other diagnostic information is  necessary to determine patient infection status.  Positive results do  not rule out bacterial infection or co-infection with other viruses. If result is PRESUMPTIVE POSTIVE SARS-CoV-2 nucleic acids MAY BE PRESENT.   A presumptive positive result was obtained on the submitted specimen  and confirmed on repeat testing.  While 2019 novel  coronavirus  (SARS-CoV-2) nucleic acids may be present in the submitted sample  additional confirmatory testing may be necessary for epidemiological  and / or clinical management purposes  to differentiate between  SARS-CoV-2 and other Sarbecovirus currently known to infect humans.  If clinically indicated additional testing with an alternate test  methodology [phone]) is advised. The SARS-CoV-2 RNA is generally  detectable in upper and lower respiratory sp ecimens during the acute  phase of infection. The expected result is Negative. Fact Sheet for Patients:  StrictlyIdeas.no Fact Sheet for Healthcare Providers: BankingDealers.co.za This test is not yet approved or cleared by the [place] FDA and has been authorized for detection and/or diagnosis of SARS-CoV-2 by FDA under an Emergency Use Authorization (EUA).  This EUA will remain in effect (meaning this test can be used) for the duration of the COVID-19 declaration under Section 564(b)(1) of the Act, 21 U.S.C. section 360bbb-3(b)(1), unless the authorization is terminated or revoked sooner. Performed at [facility], [place] [address], [place], [place] [address]   Blood culture (routine x 2)     Status: None   Collection Time: 06/09/19 10:47 AM   Specimen: BLOOD  Result Value Ref Range Status   Specimen Description BLOOD UNKNOWN  Final   Special Requests   Final    BOTTLES  DRAWN AEROBIC AND ANAEROBIC Blood Culture results may not be optimal due to an inadequate volume of blood received in culture bottles   Culture   Final    NO GROWTH 5 DAYS Performed at [facility], [place] [address] [place], [place] [address]    Report Status 06/14/2019 FINAL  Final  Blood culture (routine x 2)     Status: None   Collection Time: 06/09/19  2:22 PM   Specimen: BLOOD  Result Value Ref Range Status   Specimen Description BLOOD LEFT ANTECUBITAL  Final   Special Requests   Final     BOTTLES DRAWN AEROBIC ONLY Blood Culture results may not be optimal due to an inadequate volume of blood received in culture bottles   Culture   Final    NO GROWTH 5 DAYS Performed at [facility], [place] [address] [place], [place] [address]    Report Status 06/14/2019 FINAL  Final    Coagulation Studies: No results for input(s): LABPROT, INR in the last 72 hours.  Urinalysis: No results for input(s): COLORURINE, LABSPEC, PHURINE, GLUCOSEU, HGBUR, BILIRUBINUR, KETONESUR, PROTEINUR, UROBILINOGEN, NITRITE, LEUKOCYTESUR in the last 72 hours.  Invalid input(s): APPERANCEUR    Imaging: No results found.   Medications:   . sodium chloride    . sodium chloride     . acyclovir  400 mg Oral QHS  . amLODipine  7.5 mg Oral QHS  . calcium carbonate  2 tablet Oral QHS  . Chlorhexidine Gluconate Cloth  6 each Topical Q0600  . Chlorhexidine Gluconate Cloth  6 each Topical Q0600  . cinacalcet  60 mg Oral QPM  . ciprofloxacin  500 mg Oral QPM  . clonazePAM  0.5 mg Oral Q M,W,F-HD  . darbepoetin (ARANESP) injection - DIALYSIS  60 mcg Intravenous Q Fri-HD  . fluticasone furoate-vilanterol  1 puff Inhalation Daily  . insulin aspart  0-5 Units Subcutaneous QHS  . insulin aspart  0-9 Units Subcutaneous TID WC  . lacosamide  150 mg Oral BID  . lacosamide  150 mg Oral Q M,W,F-2000  . lamoTRIgine  150 mg Oral BID  . metoprolol tartrate  12.5 mg Oral BID  . multivitamin  1 tablet Oral QHS  . pantoprazole  40 mg Oral Daily   sodium chloride, sodium chloride, acetaminophen, bisacodyl, calcium carbonate, camphor-menthol **AND** hydrOXYzine, diphenhydrAMINE, docusate sodium, feeding supplement (NEPRO CARB STEADY), guaiFENesin-dextromethorphan, prochlorperazine **OR** prochlorperazine **OR** prochlorperazine, prochlorperazine, senna-docusate, simethicone, sorbitol, traZODone, zolpidem  Assessment/ Plan:   ESRD-Monday Wednesday Friday dialysis no heparin secondary to subdural hemorrhage.   Appeared to tolerate dialysis well 06/22/2019 with 2 L ultrafiltration.  Next dialysis planned for 06/24/2019  Altered mental status/acute on chronic subdural hemorrhage followed by neurology neurosurgery no surgical intervention.  History of Proteus bacteremia currently using Cipro has nephrolithiasis with possibility that this could be potential source.  Seizure disorders Vimpat ,Lamictal and Klonopin with dialysis.  Hypophosphatemia will hold calcium phosphate at this time  Hypertension well-controlled now on amlodipine and Lopressor  Diabetes as per primary team  Multiple myeloma recent stem cell transplant planned and aborted secondary to subdural hemorrhage and bacteremia  Secondary hyperparathyroidism calcium goal continue Sensipar.  Phosphorus low holding calcium acetate  Anemia continues on darbepoetin 60 mcg dose every Friday  Disposition continue on rehab   LOS: [place] @TODAY @8 :12 AM

## 2019-06-23 NOTE — Progress Notes (Signed)
Speech Language Pathology Daily Session Note  Patient Details  Name: Twain Stenseth [facility] MRN: [id number] Date of Birth: [birth year]  Today's Date: 06/23/2019 SLP Individual Time: 0725-0825 SLP Individual Time Calculation (min): 60 min  Short Term Goals: Week 2: SLP Short Term Goal 1 (Week 2): STG=LTG due to ELOS  Skilled Therapeutic Interventions:  Skilled treatment session focused on cognition. SLP facilitated session by administering the Cognitive Linguistic Quick Test. Initially, it appeared pt was motivated to complete tasks within assessment and he eagerly took pencil, repositioned himself for optimal ability. Acomodations made for RUE weakness and vision. Pt's motivated and task tolerance faded after clock drawing subtest (suspect d/t level of difficulty pt had with task). Pt demonstrated poor organization of task, complete left inattention and inability to complete basic steps within task such as putting numbers on clock.   Overall, pt obtained severity rating scale of SEVERE for all sub-tests. Pt's deficits are related to poor planning of tasks, execution of tasks, delayed processing, memory deficits and very poor task tolerance. Despite education, pt continues to state "I can do everything at home that I need to do." Hypothetical examples provided by SLP on how assessed abilities translate into functional disability within tasks in the home environment. Pt was not receptive. Pt left upright in bed, bed alarm on and all needs within reach. Continue per current plan of care.      Pain    Therapy/Group: Individual Therapy  Ailanie Ruttan 06/23/2019, 10:14 AM

## 2019-06-23 NOTE — Telephone Encounter (Signed)
Called patient's wife Isaiah [name] and made her aware that Dr. Alen Blew will arrange for follow-up after patient is discharged from the hospital. Patient's wife aware to expect a call from the scheduling department to set up appointment. Verbalized understanding.

## 2019-06-23 NOTE — Progress Notes (Signed)
Social Work Patient ID: HOY FALLERT, male   DOB: 1965-12-25, 53 y.o.   MRN: [id number] Spoke with wife via telephone and pt in person to inform team conference goals supervision-CGA level and target discharge date 10/22. Pt wants to go home ASAP and wife reports he will stay as long as needed to get as much rehab as he needs. Discussed coming in for education prior to discharge and she reports she has been doing this for three years now and knows what to do. She is trying to work while he is here and if changes her mind will come in. Pt was set up with a home health agency at [facility] when there. Will contact and make them aware of his discharge date. Work toward discharge next week.

## 2019-06-23 NOTE — Telephone Encounter (Signed)
Wife was calling in with some concerns about patient. He is in the hospital and she is wanting to get a follow up in office. I do not see anything would this be urgent work in? Not if this is something that can be handled by calling her and finding out what is going on or looking at hospital notes. Thanks!

## 2019-06-23 NOTE — Progress Notes (Signed)
Physical Therapy Session Note  Patient Details  Name: Isaiah [name] MRN: [id number] Date of Birth: 07-29-1966  Today's Date: 06/23/2019 PT Individual Time: 1045-1200 PT Individual Time Calculation (min): 75 min   Short Term Goals: Week 1:  PT Short Term Goal 1 (Week 1): =LTG due to ELOS PT Short Term Goal 1 - Progress (Week 1): Progressing toward goal Week 2:  PT Short Term Goal 1 (Week 2): STG = LTG  Skilled Therapeutic Interventions/Progress Updates:    Received pt sitting upright in recliner. Pt agreeable to PT, but states he is very tired from therapy this morning. Pt denied any pain. Session focused on functional mobility, ambulation, stair navigation, balance, LE strength, and improved tolerance to activity. Pt ambulated 5f on treadmill with LiteGait harness donned at 0.543m with bilateral UE support for 1 minute 30 seconds x Trial 1. Trial 2 pt ambulated 4638ft [id number] bilateral UE support for 1 minute. Pt demonstrated decreased RLE foot clearance and anterior lean. Pt reported he was fatigued and stated "this is as good as you're going to get". On NuStep pt performed LE strengthening for 3 min using LE only and 3 min using UE only at workload 7 with verbal cues to keep steps/min above 20. Pt ambulated 20ft21f trials with no AD min A to staircase. Pt navigated 12 steps x1 trial and 16 steps x 1 trial with bilateral rails CGA using a reciprocal pattern when ascending and step to pattern when descending. Pt continues to require verbal cues for RLE placement on step. Pt performed standing dynamic balance reaching and matching cards on mirror with RUE only x 2 trials. Pt required verbal cues for RUE only and fine motor control to match velcro. Concluded session with pt sitting in recliner, needs within reach, and chair pad alarm on.   Therapy Documentation Precautions:  Precautions Precautions: Fall Precaution Comments: R inattention Restrictions Weight Bearing Restrictions:  No  Therapy/Group: Individual Therapy  EmilyNetta Corrigan DPT, CSRS  Anna Becky SaxDPT   06/23/2019, 5:05 PM

## 2019-06-23 NOTE — Telephone Encounter (Signed)
-----   Message from Wyatt Portela, MD sent at 06/23/2019  9:20 AM EDT ----- Will arrange follow up upon his discharge. Thanks. I will send message to scheduling.  ----- Message ----- From: Tami Lin, RN Sent: 06/23/2019   8:57 AM EDT To: Wyatt Portela, MD  Update from patient's wife: Patient went to [name] but was not able to have bone marrow transplant or chemo due to Proteus in the blood. Discharged and came home last Tuesday felt bad- went to the ED on Thursday and is still admitted at [facility].  Wife is concerned about Mutiple Myeloma remission and what the next steps will be. Patient is set for discharge from rehab next Thursday.  Lanelle Bal

## 2019-06-24 ENCOUNTER — Inpatient Hospital Stay (HOSPITAL_COMMUNITY): Payer: 59 | Admitting: Speech Pathology

## 2019-06-24 ENCOUNTER — Inpatient Hospital Stay (HOSPITAL_COMMUNITY): Payer: 59 | Admitting: Physical Therapy

## 2019-06-24 ENCOUNTER — Inpatient Hospital Stay (HOSPITAL_COMMUNITY): Payer: 59 | Admitting: Occupational Therapy

## 2019-06-24 LAB — GLUCOSE, CAPILLARY
Glucose-Capillary: 109 mg/dL — ABNORMAL HIGH (ref 70–99)
Glucose-Capillary: 116 mg/dL — ABNORMAL HIGH (ref 70–99)
Glucose-Capillary: 162 mg/dL — ABNORMAL HIGH (ref 70–99)
Glucose-Capillary: 67 mg/dL — ABNORMAL LOW (ref 70–99)
Glucose-Capillary: 85 mg/dL (ref 70–99)

## 2019-06-24 MED ORDER — DARBEPOETIN ALFA 60 MCG/0.3ML IJ SOSY
PREFILLED_SYRINGE | INTRAMUSCULAR | Status: AC
  Filled 2019-06-24: qty 0.3

## 2019-06-24 NOTE — Telephone Encounter (Signed)
Spoke to wife. She states he is doing a little bit better. Her concern is that he may have more short term memory damage, the way he explained to wife, with all the issues he has had with his body, brain converted back to first time he had stroke where he could not walk, wife did not fully understand. Explained to wife that patients who have recovered from a prior stroke may experience a reemergence of their original stroke syndrome secondary to metabolic derangements, sedation, infection, and/or fatigue, but it does not mean they have a new stroke (he did not have a new stroke).  Now he is doing PT, OT, ST which is really helping, starting to get up and move again. She is concerned bec sometimes she has to repeat things. He is currently at rehab at [facility], discharging next week. Discussed that it would not be uncommon that he is having new cognitive changes due to recent hemorrhage, as well as all the stress and changes occurring. Time will tell as to if this is going to be more permanent with memory concerns.   He will be put on cancellation list for f/u after rehab discharge.

## 2019-06-24 NOTE — Progress Notes (Signed)
[place] PHYSICAL MEDICINE & REHABILITATION PROGRESS NOTE   Subjective/Complaints: Patient seen sitting up in bed this morning, talking with his wife.  He states he slept fairly overnight due to just being in the hospital.  He notes he is getting stronger.  ROS: Denies CP, SOB, N/V/D  Objective: No results found. Recent Labs    06/22/19 1420  WBC 9.1  HGB 8.6*  HCT RESULTS UNAVAILABLE DUE TO INTERFERING SUBSTANCE  PLT 226   Recent Labs    06/22/19 1421  NA 137  K 4.1  CL 96*  CO2 26  GLUCOSE 123*  BUN 41*  CREATININE 11.36*  CALCIUM 8.1*    Intake/Output Summary (Last 24 hours) at 06/24/2019 0859 Last data filed at 06/24/2019 0700 Gross per 24 hour  Intake 600 ml  Output -  Net 600 ml     Physical Exam: Vital Signs Blood pressure (!) 107/53, pulse 68, temperature 98.9 F (37.2 C), temperature source Oral, resp. rate 17, height 6' (1.829 m), weight 81.6 kg, SpO2 97 %. Constitutional: No distress . Vital signs reviewed. HENT: Normocephalic.  Atraumatic. Eyes: EOMI. No discharge. Cardiovascular: No JVD. Respiratory: Normal effort.  No stridor. GI: Non-distended. Skin: Warm and dry.  Intact. Psych: Normal mood.  Normal behavior. Musc: No edema in extremities.  No tenderness in extremities. Neurologic: Alert Dysarthria Facial weakness Motor: LUE/LLE: 5/5 proximal distal RUE: 4+/5 proximally, 4/5 right hand, improving RLE: 4+/5 proximal distal   Assessment/Plan: 1. Functional deficits secondary to SDH which require 3+ hours per day of interdisciplinary therapy in a comprehensive inpatient rehab setting.  Physiatrist is providing close team supervision and 24 hour management of active medical problems listed below.  Physiatrist and rehab team continue to assess barriers to discharge/monitor patient progress toward functional and medical goals  Care Tool:  Bathing    Body parts bathed by patient: Right arm, Left arm, Chest, Abdomen, Face, Left lower  leg, Right lower leg, Left upper leg, Front perineal area, Buttocks, Right upper leg   Body parts bathed by helper: Left lower leg, Right lower leg, Left upper leg, Right upper leg, Buttocks, Front perineal area, Abdomen, Chest, Left arm, Right arm Body parts n/a: Front perineal area, Buttocks, Right upper leg, Left lower leg, Right lower leg, Left upper leg(Pt declined completing secondary to stating it was done earlier)   Bathing assist Assist Level: Contact Guard/Touching assist     Upper Body Dressing/Undressing Upper body dressing Upper body dressing/undressing activity did not occur (including orthotics): N/A What is the patient wearing?: Pull over shirt    Upper body assist Assist Level: Minimal Assistance - Patient > 75%    Lower Body Dressing/Undressing Lower body dressing      What is the patient wearing?: Underwear/pull up, Pants, Incontinence brief     Lower body assist Assist for lower body dressing: Minimal Assistance - Patient > 75%     Toileting Toileting    Toileting assist Assist for toileting: Minimal Assistance - Patient > 75%     Transfers Chair/bed transfer  Transfers assist     Chair/bed transfer assist level: Minimal Assistance - Patient > 75%     Locomotion Ambulation   Ambulation assist      Assist level: Minimal Assistance - Patient > 75% Assistive device: Walker-rolling Max distance: 71f   Walk 10 feet activity   Assist     Assist level: Minimal Assistance - Patient > 75% Assistive device: No Device   Walk 50 feet activity  Assist    Assist level: Minimal Assistance - Patient > 75% Assistive device: No Device    Walk 150 feet activity   Assist Walk 150 feet activity did not occur: Safety/medical concerns(High fatigue level)  Assist level: Minimal Assistance - Patient > 75% Assistive device: No Device    Walk 10 feet on uneven surface  activity   Assist     Assist level: Maximal Assistance - Patient 25 -  49%(mod A for ramp, max A when ambulating over mulch) Assistive device: Other (comment)(no AD)   Wheelchair     Assist Will patient use wheelchair at discharge?: No             Wheelchair 50 feet with 2 turns activity    Assist            Wheelchair 150 feet activity     Assist          Blood pressure (!) 107/53, pulse 68, temperature 98.9 F (37.2 C), temperature source Oral, resp. rate 17, height 6' (1.829 m), weight 81.6 kg, SpO2 97 %.    Medical Problem List and Plan: 1.  Impaired ADLs/mobility and function secondary to acute on chronic SDH with 5 mm of midline shift to left- with R hemiparesis and dysarthria- f/u with [facility] after rehab; no head CT unless has a change in function, per NSU  Continue CIR  Repeat head CT on 10/12 reviewed-stable.  Will hold off on repeat MRI at present given what appeared to be fluctuations secondary to nonacute events.  Will consider repeat MRI if necessary, not needed at this time. 2.  Antithrombotics: -DVT/anticoagulation:  Mechanical: Sequential compression devices, below knee Bilateral lower extremities and IVC filter             -antiplatelet therapy: N/A due to SDH 3. Pain Management:  Tylenol prn.  4. Mood: LCSW to follow for evaluation and support.              -antipsychotic agents: N/A 5. Neuropsych: This patient is not fully capable of making decisions on his own behalf. 6. Skin/Wound Care: Routine pressure relief measures.  7. Fluids/Electrolytes/Nutrition: Strict I/O. Labs per HD protocol.   9. Proteus bacteremia/Recent aspiration PNA: Treated with Zosyn and transitioned to cipro on 9/24 for 120 days total 10. Seizure disorder: On Vimpat and Lamictal bid.  Post HD on MWF needs additional doses of clonazepam 0.5 mg and Lamictal 150 mg to prevent breakthrough seizures.   No seizures from admission to CIR to 10/16 11. T2DM with hyperglycemia: Monitor BS ac/hs. Continue CBG (last 3)  Recent Labs    06/23/19 1656  06/23/19 2139 06/24/19 0618  GLUCAP 113* 120* 116*   Relatively controlled on 10/16 12.  ESRD: Continue HD MWF at the end of the day to help with tolerance of therapy.  13. HTN: Monitor BP tid  Amlodipine increased to 7.5 daily  Continue metoprolol bid.   Labile on 10/16 14.  Anemia of chronic disease:  Hemoglobin 8.6 on 10/14, labs with HD 15. H/o A fib/PFO: Monitor HR tid--on metoprolol. Was on ASA only. No coumadin due to recurrent GIB and was on ASA--d/c due to SDH. Vitals:   06/23/19 2027 06/24/19 0552  BP: (!) 102/46 (!) 107/53  Pulse: 73 68  Resp: 19 17  Temp: 98.9 F (37.2 C) 98.9 F (37.2 C)  SpO2: 99% 97%   Heart rate controlled on 10/16 16. H/o SS thalassemia/PE/DVT: IVC filter placed 09/2015.  17. 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 Multiple myeloma- was scheduled for autologous stem cell transplant at [facility]- will need f/u after rehab d/c. Labs also need to be monitored at least weekly for now. 19. B/L renal stones-Intermittent monitoring.  20. OSA on CPAP- CPAP as required for nighttime.  LOS: 10 days A FACE TO FACE EVALUATION WAS PERFORMED  Javaughn Opdahl Lorie Phenix 06/24/2019, 8:59 AM

## 2019-06-24 NOTE — Progress Notes (Signed)
Physical Therapy Session Note  Patient Details  Name: [name] MRN: [id number] Date of Birth: [birth year]  Today's Date: 06/24/2019 PT Individual Time: 1106-1203 PT Individual Time Calculation (min): 57 min   Short Term Goals: Week 2:  PT Short Term Goal 1 (Week 2): STG = LTG  Skilled Therapeutic Interventions/Progress Updates:    Pt received sitting in recliner, asleep but easily awakens and is agreeable to therapy session. Sit<>stands, no AD, with min assist for lifting and balance throughout session due to increased difficulty lifting up into standing and increased postural sway once in standing. Ambulated ~181f, no AD, to main therapy gym with mod assist for balance due to pt having continuous R anterolateral LOB, inconsistent step length and step width, as well as increased postural sway requiring ~5 standing breaks to regain balance prior to continuing forward ambulation. Pt reports feeling lightheaded while ambulating once almost to therapy gym - once pt sat safely in chair therapist assessed vitals:BP 138/80 (MAP 97), HR 59bpm and continued to monitor HR throughout session with it ranging between 57 and 65bpm. Pt requesting to perform stair navigation. Ambulated ~250fto stairs, no AD, with mod assist for balance due to continuation of above impairments; pt denies lightheadedness. Ascended/descended 16 steps using B HRs with reciprocal pattern on ascent and step-to pattern on descent leading with L LE - pt demonstrates reliance on B UE support for balance and eccentric control.  Performed the following standing balance training on biodex:   % weight bearing: pt demonstrating L lateral weightshift preference when using B UE support on rails and R lateral weight shift preference without UE support - pt with increased difficulty shifting full weight laterally to either side - requires min assist for balance   Limits of stability: pt with significantly increased difficulty performing this  task and on 3rd target was trying to shift weight posteriorly but required mod assist to maintain upright due to pt leaning back into therapist demonstrating lack of understanding on how to posteriorly weight shift onto his heels despite max cuing and manual facilitation - pt became frustrated with task and requested not continue  Ambulated ~8035fno AD, with min/mod assist for balance with pt demonstrating improving upright trunk posture with decreased R anterolateral LOB with mod cuing for correction. Ambulated ~150f22fck to room using RW with min assist for balance and ~5x mod assist for recovery upon R anterolateral LOB. Pt left sitting in recliner with needs in reach, chair alarm on, and NT present for blood sugar assessment.   Therapy Documentation Precautions:  Precautions Precautions: Fall Precaution Comments: R inattention Restrictions Weight Bearing Restrictions: No  Vital Signs: Noted low HR during session - details above.   Pain: Denies pain during session.   Therapy/Group: Individual Therapy  CarlTawana Scale, DPT 06/24/2019, 12:12 PM

## 2019-06-24 NOTE — Progress Notes (Signed)
[facility] ROUNDING NOTE   Subjective:   This is a 53 year old gentleman with a history of diabetes end-stage renal disease Monday Wednesday Friday dialysis atrial fibrillation history of CVA 2017 history of thalassemia history of DVT/pulmonary embolus negative coagulopathy with IVC filter since 2017, history GIB, history of seizure disorders, history of multiple myeloma with recent admission to [facility] 05/26/2019 for stem cell transplant procedure was held due to findings of Proteus bacteremia.  He was also found to have aspiration pneumonia and acute on chronic subdural hemorrhage with bilateral renal stones also found.  He had progressive weakness slurred speech worsening right-sided facial weakness and droop he was found to have a CT scan of the head that showed 1.5 cm acute on chronic subdural hemorrhage with 4 mm midline shift.  Dr. Ellene Route was consulted and recommended conservative therapy.  He was also seen by neurology Dr. Leonel Ramsay who felt this was a low suspicion for neurological cause mild worsening right-sided weakness and follow-up CT scan of head 10 4 showed stable right subdural hemorrhage with stable mass-effect.  Mentation has continued to improve during hospitalization.  Blood pressure 107/53 pulse 68 temperature 98.9 O2 sats 97% room air  Sodium 137 potassium 4.1 chloride 96 CO2 26 BUN 41 creatinine 11.36 glucose 123 calcium 8.1 phosphorus 2.0 albumin 3.4  Dialysis planned 06/24/2019  Acyclovir 400 mg nightly, amlodipine 7.5 mg nightly, PhosLo 3 tablets 3 times daily, Tums 400 mg 2 tablets at bedtime, Sensipar 60 mg daily, Cipro 500 mg daily, darbepoetin 60 mcg last administered 06/17/2019, Klonopin 0.5 mg Monday Wednesday Friday, insulin sliding scale, Vimpat 250 mg Monday Wednesday Friday, Lamictal 150 mg twice daily, metoprolol 12.5 mg twice daily, multivitamins 1 daily Protonix 40 mg daily   Objective:  Vital signs in last 24 hours:  Temp:  [97.7 F  (36.5 C)-98.9 F (37.2 C)] 98.9 F (37.2 C) (10/16 0552) Pulse Rate:  [66-73] 68 (10/16 0552) Resp:  [16-19] 17 (10/16 0552) BP: (102-143)/(46-53) 107/53 (10/16 0552) SpO2:  [97 %-100 %] 97 % (10/16 0552)  Weight change:  Filed Weights   06/22/19 1330 06/22/19 1730 06/23/19 0357  Weight: 84.5 kg 82.5 kg 81.6 kg    Intake/Output: I/O last 3 completed shifts: In: 840 [P.O.:840] Out: -    Intake/Output this shift:  No intake/output data recorded.  General:NAD, WNWD male, sitting in bedside chair Heart:RRR, no mrg Lungs:CTAB Abdomen:soft, NTND Extremities:no LE edema Dialysis Access: R AVF +b/t   Basic Metabolic Panel: Recent Labs  Lab 06/17/19 1338 06/20/19 1848 06/22/19 1421  NA 136 134* 137  K 4.6 4.5 4.1  CL 96* 94* 96*  CO2 27 25 26   GLUCOSE 151* 116* 123*  BUN 43* 60* 41*  CREATININE 11.32* 14.01* 11.36*  CALCIUM 8.9 8.9 8.1*  PHOS 2.2* 1.8* 2.0*    Liver Function Tests: Recent Labs  Lab 06/17/19 1338 06/20/19 1848 06/22/19 1421  ALBUMIN 3.4* 3.5 3.4*   No results for input(s): LIPASE, AMYLASE in the last 168 hours. No results for input(s): AMMONIA in the last 168 hours.  CBC: Recent Labs  Lab 06/17/19 1338 06/20/19 1849 06/22/19 1420  WBC 7.5 8.7 9.1  HGB 8.5* 8.5* 8.6*  HCT RESULTS UNAVAILABLE DUE TO INTERFERING SUBSTANCE 22.7* RESULTS UNAVAILABLE DUE TO INTERFERING SUBSTANCE  MCV RESULTS UNAVAILABLE DUE TO INTERFERING SUBSTANCE 88.3 RESULTS UNAVAILABLE DUE TO INTERFERING SUBSTANCE  PLT 249 215 226    Cardiac Enzymes: No results for input(s): CKTOTAL, CKMB, CKMBINDEX, TROPONINI in the last 168 hours.  BNP: Invalid input(s): POCBNP  CBG: Recent Labs  Lab 06/23/19 0619 06/23/19 1207 06/23/19 1656 06/23/19 2139 06/24/19 0618  GLUCAP 117* 124* 113* 120* 116*    Microbiology: Results for orders placed or performed during the hospital encounter of 06/09/19  SARS Coronavirus 2 [name] order, Performed in [facility] hospital  lab) Nasopharyngeal Nasopharyngeal Swab     Status: None   Collection Time: 06/09/19 10:47 AM   Specimen: Nasopharyngeal Swab  Result Value Ref Range Status   SARS Coronavirus 2 NEGATIVE NEGATIVE Final    Comment: (NOTE) If result is NEGATIVE SARS-CoV-2 target nucleic acids are NOT DETECTED. The SARS-CoV-2 RNA is generally detectable in upper and lower  respiratory specimens during the acute phase of infection. The lowest  concentration of SARS-CoV-2 viral copies this assay can detect is 250  copies / mL. A negative result does not preclude SARS-CoV-2 infection  and should not be used as the sole basis for treatment or other  patient management decisions.  A negative result may occur with  improper specimen collection / handling, submission of specimen other  than nasopharyngeal swab, presence of viral mutation(s) within the  areas targeted by this assay, and inadequate number of viral copies  (<250 copies / mL). A negative result must be combined with clinical  observations, patient history, and epidemiological information. If result is POSITIVE SARS-CoV-2 target nucleic acids are DETECTED. The SARS-CoV-2 RNA is generally detectable in upper and lower  respiratory specimens dur ing the acute phase of infection.  Positive  results are indicative of active infection with SARS-CoV-2.  Clinical  correlation with patient history and other diagnostic information is  necessary to determine patient infection status.  Positive results do  not rule out bacterial infection or co-infection with other viruses. If result is PRESUMPTIVE POSTIVE SARS-CoV-2 nucleic acids MAY BE PRESENT.   A presumptive positive result was obtained on the submitted specimen  and confirmed on repeat testing.  While 2019 novel coronavirus  (SARS-CoV-2) nucleic acids may be present in the submitted sample  additional confirmatory testing may be necessary for epidemiological  and / or clinical management purposes  to  differentiate between  SARS-CoV-2 and other Sarbecovirus currently known to infect humans.  If clinically indicated additional testing with an alternate test  methodology [phone]) is advised. The SARS-CoV-2 RNA is generally  detectable in upper and lower respiratory sp ecimens during the acute  phase of infection. The expected result is Negative. Fact Sheet for Patients:  StrictlyIdeas.no Fact Sheet for Healthcare Providers: BankingDealers.co.za This test is not yet approved or cleared by the [place] FDA and has been authorized for detection and/or diagnosis of SARS-CoV-2 by FDA under an Emergency Use Authorization (EUA).  This EUA will remain in effect (meaning this test can be used) for the duration of the COVID-19 declaration under Section 564(b)(1) of the Act, 21 U.S.C. section 360bbb-3(b)(1), unless the authorization is terminated or revoked sooner. Performed at [facility], [place] [address], [place], [place] [address]   Blood culture (routine x 2)     Status: None   Collection Time: 06/09/19 10:47 AM   Specimen: BLOOD  Result Value Ref Range Status   Specimen Description BLOOD UNKNOWN  Final   Special Requests   Final    BOTTLES DRAWN AEROBIC AND ANAEROBIC Blood Culture results may not be optimal due to an inadequate volume of blood received in culture bottles   Culture   Final    NO GROWTH 5 DAYS Performed  at [facility], [place] [address], [place], [place] [address]    Report Status 06/14/2019 FINAL  Final  Blood culture (routine x 2)     Status: None   Collection Time: 06/09/19  2:22 PM   Specimen: BLOOD  Result Value Ref Range Status   Specimen Description BLOOD LEFT ANTECUBITAL  Final   Special Requests   Final    BOTTLES DRAWN AEROBIC ONLY Blood Culture results may not be optimal due to an inadequate volume of blood received in culture bottles   Culture   Final    NO GROWTH 5 DAYS Performed at [facility], [place] [address], [place], [place] [address]    Report Status 06/14/2019 FINAL  Final    Coagulation Studies: No results for input(s): LABPROT, INR in the last 72 hours.  Urinalysis: No results for input(s): COLORURINE, LABSPEC, PHURINE, GLUCOSEU, HGBUR, BILIRUBINUR, KETONESUR, PROTEINUR, UROBILINOGEN, NITRITE, LEUKOCYTESUR in the last 72 hours.  Invalid input(s): APPERANCEUR    Imaging: No results found.   Medications:   . sodium chloride    . sodium chloride     . acyclovir  400 mg Oral QHS  . amLODipine  7.5 mg Oral QHS  . calcium carbonate  2 tablet Oral QHS  . Chlorhexidine Gluconate Cloth  6 each Topical Q0600  . cinacalcet  60 mg Oral QPM  . ciprofloxacin  500 mg Oral QPM  . clonazePAM  0.5 mg Oral Q M,W,F-HD  . darbepoetin (ARANESP) injection - DIALYSIS  60 mcg Intravenous Q Fri-HD  . fluticasone furoate-vilanterol  1 puff Inhalation Daily  . insulin aspart  0-5 Units Subcutaneous QHS  . insulin aspart  0-9 Units Subcutaneous TID WC  . lacosamide  150 mg Oral BID  . lacosamide  150 mg Oral Q M,W,F-2000  . lamoTRIgine  150 mg Oral BID  . metoprolol tartrate  12.5 mg Oral BID  . multivitamin  1 tablet Oral QHS  . pantoprazole  40 mg Oral Daily   sodium chloride, sodium chloride, acetaminophen, bisacodyl, calcium carbonate, camphor-menthol **AND** hydrOXYzine, diphenhydrAMINE, docusate sodium, feeding supplement (NEPRO CARB STEADY), guaiFENesin-dextromethorphan, prochlorperazine **OR** prochlorperazine **OR** prochlorperazine, prochlorperazine, senna-docusate, simethicone, sorbitol, traZODone, zolpidem  Assessment/ Plan:   ESRD-Monday Wednesday Friday dialysis no heparin secondary to subdural hemorrhage.  Appeared to tolerate dialysis well 06/22/2019 with 2 L ultrafiltration.  Next dialysis planned for 06/24/2019  Altered mental status/acute on chronic subdural hemorrhage followed by neurology neurosurgery no surgical intervention.  History of Proteus  bacteremia currently using Cipro has nephrolithiasis with possibility that this could be potential source.  Seizure disorders Vimpat ,Lamictal and Klonopin with dialysis.  Hypophosphatemia will hold calcium phosphate at this time  Hypertension well-controlled now on amlodipine and Lopressor  Diabetes as per primary team  Multiple myeloma recent stem cell transplant planned and aborted secondary to subdural hemorrhage and bacteremia  Secondary hyperparathyroidism calcium goal continue Sensipar.  Phosphorus low holding calcium acetate  Anemia continues on darbepoetin 60 mcg dose every Friday  Disposition continue on rehab   LOS: Kirk @TODAY @9 :52 AM

## 2019-06-24 NOTE — Progress Notes (Signed)
Occupational Therapy Session Note  Patient Details  Name: Isaiah [name] MRN: [id number] Date of Birth: September 23, 1965  Today's Date: 06/24/2019 OT Individual Time: 1594-5859 OT Individual Time Calculation (min): 56 min    Short Term Goals: Week 2:  OT Short Term Goal 1 (Week 2): Continue working on established supervision level goals set for long term..  Skilled Therapeutic Interventions/Progress Updates:    Pt completed supine to sit with supervision and then therapist helped to reposition his socks.  He was able to complete transfer to the sink with use of the RW and min guard assist in order to complete washing his face and brushing his teeth.  Mod instructional cueing to not pull up on the walker with sit to stand but to instead push up from the EOB.  He then ambulated down to the ortho gym with min guard as well.  Worked on standing balance with use of the Dynavision.  He was able to maintain standing with min guard assist while completing intervals of 1 and 2 mins.  He averaged 2.6-3.7 seconds with use of both hands and 4.1 with isolated use of the right hand only.  Attempted task standing on foam, but he needed constant mod to max assist to maintain his balance.  He was able to then work on static standing on the foam without use of the Dynavision with overall mod assist secondary to posterior lean and LOB.  Returned to room at end of session via walker with min guard assist and pt was lft sitting up in the wheelchair with call button and phone in reach and safety alarm pad in place.  Pt not oriented to day of the week as he stated "Tuesday", but he was oriented to the month, year, and day of the month.    Therapy Documentation Precautions:  Precautions Precautions: Fall Precaution Comments: R inattention Restrictions Weight Bearing Restrictions: No  Pain: Pain Assessment Pain Scale: 0-10 Pain Score: 0-No pain ADL: See Care Tool Section for some details of ADL tasks  Therapy/Group:  Individual Therapy  Isaiah [name] OTR/L 06/24/2019, 12:25 PM

## 2019-06-24 NOTE — Progress Notes (Signed)
Speech Language Pathology Daily Session Note  Patient Details  Name: Nashaun Hillmer [name] MRN: [id number] Date of Birth: May 28, 1966  Today's Date: 06/24/2019 SLP Individual Time: 0725-0825 SLP Individual Time Calculation (min): 60 min  Short Term Goals: Week 2: SLP Short Term Goal 1 (Week 2): STG=LTG due to ELOS  Skilled Therapeutic Interventions:  Skilled treatment session focused on cognition goals. SLP facilitiated session by providing Mod A cues to pre-plan, sequence/organize, verbalize each turn within game of checkers. Even with Mod A cues, pt with decreased mental flexibility and required more than a reasonable amount of time to complete each turn. Pt perseverated on being able to "jump" during each turn even if that wasn't possible. Pt required Min A cues to recall which colored checker was his. Education provided on skills targeted during task. Pt left upright in bed, bed alarm on and all needs within reach. Continue per current plan of care.      Pain    Therapy/Group: Individual Therapy  Graycie Halley 06/24/2019, 9:50 AM

## 2019-06-25 ENCOUNTER — Inpatient Hospital Stay (HOSPITAL_COMMUNITY): Payer: 59 | Admitting: Speech Pathology

## 2019-06-25 LAB — GLUCOSE, CAPILLARY
Glucose-Capillary: 92 mg/dL (ref 70–99)
Glucose-Capillary: 162 mg/dL — ABNORMAL HIGH (ref 70–99)
Glucose-Capillary: 125 mg/dL — ABNORMAL HIGH (ref 70–99)
Glucose-Capillary: 132 mg/dL — ABNORMAL HIGH (ref 70–99)

## 2019-06-25 NOTE — Progress Notes (Signed)
Speech Language Pathology Daily Session Note  Patient Details  Name: Isaiah [name] [facility] MRN: [id number] Date of Birth: 1966/02/12  Today's Date: 06/25/2019 SLP Individual Time: 0930-1013 SLP Individual Time Calculation (min): 43 min  Short Term Goals: Week 2: SLP Short Term Goal 1 (Week 2): STG=LTG due to ELOS  Skilled Therapeutic Interventions: Pt was seen for skilled ST targeting cognitive goals. Pt was sleeping upon therapist's arrival, however he was easily aroused and agreeable to participate in [place]. Pt had not eaten breakfast yet, thus SLP provided Min A verbal cues for basic problem solving as pt repositioned himself to sitting upright in the bed and set up assist with tray for self feeding. In functional conversation, pt continues to exhibit decreased intellectual awareness, evidenced in his claim "nothing really" when asked about his current deficits/challenges since hospitalization. He required Mod A verbal cues to identify 1 physical deficit, and Total A for awareness of 1 cognitive deficits (error awareness). SLP further facilitated session with Min A verbal and visual cues for error detection, Supervision A for problem solving during a basic PEG board design task. Pt was left in bed with alarm set and wife present (who arrived just as session was ending). Continue per current plan of care.       Pain Pain Assessment Pain Scale: Faces Faces Pain Scale: No hurt  Therapy/Group: Individual Therapy  Arbutus Leas 06/25/2019, 11:27 AM

## 2019-06-25 NOTE — Progress Notes (Signed)
[facility] PHYSICAL MEDICINE & REHABILITATION PROGRESS NOTE   Subjective/Complaints: Patient seen laying in bed this morning.  He states he slept well overnight.  Appreciate neurology assistance with educating wife, notes reviewed.  ROS: Denies CP, SOB, N/V/D  Objective: No results found. Recent Labs    06/22/19 1420  WBC 9.1  HGB 8.6*  HCT RESULTS UNAVAILABLE DUE TO INTERFERING SUBSTANCE  PLT 226   Recent Labs    06/22/19 1421  NA 137  K 4.1  CL 96*  CO2 26  GLUCOSE 123*  BUN 41*  CREATININE 11.36*  CALCIUM 8.1*    Intake/Output Summary (Last 24 hours) at 06/25/2019 0903 Last data filed at 06/24/2019 2223 Gross per 24 hour  Intake 240 ml  Output 2000 ml  Net -1760 ml     Physical Exam: Vital Signs Blood pressure 102/62, pulse 66, temperature 98.7 F (37.1 C), temperature source Oral, resp. rate 19, height 6' (1.829 m), weight 83.9 kg, SpO2 98 %. Constitutional: No distress . Vital signs reviewed. HENT: Normocephalic.  Atraumatic. Eyes: EOMI. No discharge. Cardiovascular: No JVD. Respiratory: Normal effort.  No stridor. GI: Non-distended. Skin: Warm and dry.  Intact. Psych: Normal mood.  Normal behavior. Musc: No edema in extremities.  No tenderness in extremities. Neurologic: Alert Dysarthria Facial weakness Motor: LUE/LLE: 5/5 proximal distal RUE: 4+/5 proximally, 4/5 right hand, unchanged RLE: 4+/5 proximal distal   Assessment/Plan: 1. Functional deficits secondary to SDH which require 3+ hours per day of interdisciplinary therapy in a comprehensive inpatient rehab setting.  Physiatrist is providing close team supervision and 24 hour management of active medical problems listed below.  Physiatrist and rehab team continue to assess barriers to discharge/monitor patient progress toward functional and medical goals  Care Tool:  Bathing    Body parts bathed by patient: Right arm, Left arm, Chest, Abdomen, Face, Left lower leg, Right lower leg,  Left upper leg, Front perineal area, Buttocks, Right upper leg   Body parts bathed by helper: Left lower leg, Right lower leg, Left upper leg, Right upper leg, Buttocks, Front perineal area, Abdomen, Chest, Left arm, Right arm Body parts n/a: Front perineal area, Buttocks, Right upper leg, Left lower leg, Right lower leg, Left upper leg(Pt declined completing secondary to stating it was done earlier)   Bathing assist Assist Level: Contact Guard/Touching assist     Upper Body Dressing/Undressing Upper body dressing Upper body dressing/undressing activity did not occur (including orthotics): N/A What is the patient wearing?: Pull over shirt    Upper body assist Assist Level: Minimal Assistance - Patient > 75%    Lower Body Dressing/Undressing Lower body dressing      What is the patient wearing?: Underwear/pull up, Pants, Incontinence brief     Lower body assist Assist for lower body dressing: Minimal Assistance - Patient > 75%     Toileting Toileting    Toileting assist Assist for toileting: Minimal Assistance - Patient > 75%     Transfers Chair/bed transfer  Transfers assist     Chair/bed transfer assist level: Contact Guard/Touching assist     Locomotion Ambulation   Ambulation assist      Assist level: Moderate Assistance - Patient 50 - 74% Assistive device: No Device Max distance: 150ft   Walk 10 feet activity   Assist     Assist level: Moderate Assistance - Patient - 50 - 74% Assistive device: No Device   Walk 50 feet activity   Assist    Assist level: Moderate Assistance -   Patient - 50 - 74% Assistive device: No Device    Walk 150 feet activity   Assist Walk 150 feet activity did not occur: Safety/medical concerns(High fatigue level)  Assist level: Moderate Assistance - Patient - 50 - 74% Assistive device: No Device    Walk 10 feet on uneven surface  activity   Assist     Assist level: Maximal Assistance - Patient 25 - 49%(mod  A for ramp, max A when ambulating over mulch) Assistive device: Other (comment)(no AD)   Wheelchair     Assist Will patient use wheelchair at discharge?: No             Wheelchair 50 feet with 2 turns activity    Assist            Wheelchair 150 feet activity     Assist          Blood pressure 102/62, pulse 66, temperature 98.7 F (37.1 C), temperature source Oral, resp. rate 19, height 6' (1.829 m), weight 83.9 kg, SpO2 98 %.    Medical Problem List and Plan: 1.  Impaired ADLs/mobility and function secondary to acute on chronic SDH with 5 mm of midline shift to left- with R hemiparesis and dysarthria- f/u with NSU after rehab; no head CT unless has a change in function, per NSU  Continue CIR  Repeat head CT on 10/12 reviewed-stable.  Will hold off on repeat MRI at present given what appeared to be fluctuations secondary to nonacute events.  Will consider repeat MRI if necessary, not needed at this time. 2.  Antithrombotics: -DVT/anticoagulation:  Mechanical: Sequential compression devices, below knee Bilateral lower extremities and IVC filter             -antiplatelet therapy: N/A due to SDH 3. Pain Management:  Tylenol prn.  4. Mood: LCSW to follow for evaluation and support.              -antipsychotic agents: N/A 5. Neuropsych: This patient is not fully capable of making decisions on his own behalf. 6. Skin/Wound Care: Routine pressure relief measures.  7. Fluids/Electrolytes/Nutrition: Strict I/O. Labs per HD protocol.   9. Proteus bacteremia/Recent aspiration PNA: Treated with Zosyn and transitioned to cipro on 9/24 for 120 days total 10. Seizure disorder: On Vimpat and Lamictal bid.  Post HD on MWF needs additional doses of clonazepam 0.5 mg and Lamictal 150 mg to prevent breakthrough seizures.   No seizures from admission to CIR to 10/17 11. T2DM with hyperglycemia: Monitor BS ac/hs. Continue CBG (last 3)  Recent Labs    06/24/19 1745  06/24/19 2346 06/25/19 0612  GLUCAP 109* 85 92   Slightly labile on 10/17 12.  ESRD: Continue HD MWF at the end of the day to help with tolerance of therapy.  13. HTN: Monitor BP tid  Amlodipine increased to 7.5 daily  Continue metoprolol bid.   Soft on 10/17, monitor for trend, will consider decreasing medications if persistent 14.  Anemia of chronic disease:  Hemoglobin 8.6 on 10/14, labs with HD 15. H/o A fib/PFO: Monitor HR tid--on metoprolol. Was on ASA only. No coumadin due to recurrent GIB and was on ASA--d/c due to SDH. Vitals:   06/25/19 0505 06/25/19 0900  BP: 102/62   Pulse: 66   Resp: 19   Temp: 98.7 F (37.1 C)   SpO2: 100% 98%   Heart rate controlled on 10/17 16. H/o SS thalassemia/PE/DVT: IVC filter placed 09/2015.  17. Spasticity in setting of 2017  CVA with previous R hemiparesis-  Chronic mainly Right index no need for Botox , cont OT    18. Multiple myeloma- was scheduled for autologous stem cell transplant at [facility]- will need f/u after rehab d/c. Labs also need to be monitored at least weekly for now. 19. B/L renal stones-Intermittent monitoring.  20. OSA on CPAP- CPAP as required for nighttime.  LOS: 11 days A FACE TO FACE EVALUATION WAS PERFORMED  Kashay Cavenaugh Lorie Phenix 06/25/2019, 9:03 AM

## 2019-06-26 ENCOUNTER — Inpatient Hospital Stay (HOSPITAL_COMMUNITY): Payer: 59

## 2019-06-26 ENCOUNTER — Inpatient Hospital Stay (HOSPITAL_COMMUNITY): Payer: 59 | Admitting: Speech Pathology

## 2019-06-26 LAB — GLUCOSE, CAPILLARY
Glucose-Capillary: 103 mg/dL — ABNORMAL HIGH (ref 70–99)
Glucose-Capillary: 184 mg/dL — ABNORMAL HIGH (ref 70–99)
Glucose-Capillary: 122 mg/dL — ABNORMAL HIGH (ref 70–99)
Glucose-Capillary: 157 mg/dL — ABNORMAL HIGH (ref 70–99)

## 2019-06-26 MED ORDER — AMLODIPINE BESYLATE 2.5 MG PO TABS
2.5000 mg | ORAL_TABLET | Freq: Every day | ORAL | Status: DC
  Administered 2019-06-26 – 2019-06-29 (×4): 2.5 mg via ORAL
  Filled 2019-06-26 (×4): qty 1

## 2019-06-26 MED ORDER — CHLORHEXIDINE GLUCONATE CLOTH 2 % EX PADS: 6.0000 | MEDICATED_PAD | Freq: Every day | CUTANEOUS | Status: DC

## 2019-06-26 NOTE — Progress Notes (Signed)
Speech Language Pathology Daily Session Note  Patient Details  Name: Isaiah [name] [facility] MRN: [id number] Date of Birth: 08/14/1966  Today's Date: 06/26/2019 SLP Individual Time: 1520-1600 SLP Individual Time Calculation (min): 40 min  Short Term Goals: Week 2: SLP Short Term Goal 1 (Week 2): STG=LTG due to ELOS  Skilled Therapeutic Interventions: Pt was seen for skilled ST targeting goals for communication.  SLP facilitated the session with a verbal description task targeting use of compensatory word finding and intelligibility strategies.  Pt needed min assist verbal cues to describe people, places, and things during task but was intelligible at the sentence level with supervision verbal cues for use of increased vocal intensity and overarticulation.  Pt independently recalled personally relevant daily information such as the time of his favorite football team's game this afternoon and who they were playing but continues to demonstrate decreased intellectual awareness of his physical and cognitive deficits.  Pt was left in bed with bed alarm set and all needs within reach.  Continue per current plan of care.    Pain Pain Assessment Pain Scale: 0-10 Pain Score: 0-No pain  Therapy/Group: Individual Therapy  Nastasha Reising, Selinda Orion 06/26/2019, 4:24 PM

## 2019-06-26 NOTE — Progress Notes (Signed)
Physical Therapy Session Note  Patient Details  Name: Isaiah [name] MRN: [id number] Date of Birth: [birth year]  Today's Date: 06/26/2019 PT Individual Time: 4196-2229 PT Individual Time Calculation (min): 56 min   Short Term Goals: Week 2:  PT Short Term Goal 1 (Week 2): STG = LTG  Skilled Therapeutic Interventions/Progress Updates:    Pt supine in bed upon PT arrival, agreeable to therapy tx and denies pain. Pt transferred to sitting with supervision and reports that his BP has been low. Therapist checked BP in sitting L forearm- 129/84, HR 79 bpm. Pt ambulated to the sink without AD and with mod assist. Pt worked on standing balance this session while brushing teeth at the sink, pt with increased R lateral lean and poor awareness of lean, pt does not correct R lean despite cueing from therapist, requiring mod assist for balance. Pt ambulating back to bed without AD x 10 ft, max-total assist with anterior LOB onto the bed. With noted increases in LOB therapist decided to transport pt to gym this session, therapist and pt continue to discuss pt's deficits, pt with poor awareness and insight and continues to report "I won't fall at home." Pt performed bed mobility on the mat this session to simulate home set up, supervision assist with increased time for motor planning. Pt performed mat<>floor transfer this session with min assist to get on the floor and then max assist for transfer back up onto mat, pt with impaired motor planning noted, pt attempted to slide onto his stomach onto the mat rather than following therapist's cues for techniques, increased time to complete. Pt ambulated 2 x 80 ft this session with RW and CGA working on gait with cues for upright posture, pt with x 1 LOB to the R when turning to sit on mat resulting in LOB onto mat. Pt continues to demonstrate poor awareness. Pt performed 2 x 10 sit<>stands without AD for LE strengthening and balance. Pt performed 2 x 10 sidesteps in place  without UE support working on dynamic balance. Pt transported back to room and left in recliner with needs in reach and chair alarm set.   Therapy Documentation Precautions:  Precautions Precautions: Fall Precaution Comments: R inattention Restrictions Weight Bearing Restrictions: No    Therapy/Group: Individual Therapy   Netta Corrigan, PT, DPT, CSRS 06/26/19  9:24 AM   \ Isaiah [name] 06/26/2019, 7:48 AM

## 2019-06-26 NOTE — Progress Notes (Signed)
[facility] PHYSICAL MEDICINE & REHABILITATION PROGRESS NOTE   Subjective/Complaints: Patient seen laying in bed this morning.  He states he slept well overnight.  He was seen by nephro yesterday, notes reviewed.  He is looking forward to watching football today.  ROS: Denies CP, SOB, N/V/D  Objective: No results found. No results for input(s): WBC, HGB, HCT, PLT in the last 72 hours. No results for input(s): NA, K, CL, CO2, GLUCOSE, BUN, CREATININE, CALCIUM in the last 72 hours.  Intake/Output Summary (Last 24 hours) at 06/26/2019 0821 Last data filed at 06/26/2019 0802 Gross per 24 hour  Intake 656 ml  Output -  Net 656 ml     Physical Exam: Vital Signs Blood pressure 122/65, pulse 69, temperature 98.3 F (36.8 C), temperature source Oral, resp. rate 16, height 6' (1.829 m), weight 84.8 kg, SpO2 99 %. Constitutional: No distress . Vital signs reviewed. HENT: Normocephalic.  Atraumatic. Eyes: EOMI. No discharge. Cardiovascular: No JVD. Respiratory: Normal effort.  No stridor. GI: Non-distended. Skin: Warm and dry.  Intact. Psych: Normal mood.  Normal behavior. Musc: No edema in extremities.  No tenderness in extremities. Neurologic: Alert Dysarthria Facial weakness Motor: LUE/LLE: 5/5 proximal distal RUE: 4+/5 proximally, 4-4+/5 right hand RLE: 4+/5 proximal distal, stable  Assessment/Plan: 1. Functional deficits secondary to SDH which require 3+ hours per day of interdisciplinary therapy in a comprehensive inpatient rehab setting.  Physiatrist is providing close team supervision and 24 hour management of active medical problems listed below.  Physiatrist and rehab team continue to assess barriers to discharge/monitor patient progress toward functional and medical goals  Care Tool:  Bathing    Body parts bathed by patient: Right arm, Left arm, Chest, Abdomen, Face, Left lower leg, Right lower leg, Left upper leg, Front perineal area, Buttocks, Right upper leg   Body parts bathed by helper: Left lower leg, Right lower leg, Left upper leg, Right upper leg, Buttocks, Front perineal area, Abdomen, Chest, Left arm, Right arm Body parts n/a: Front perineal area, Buttocks, Right upper leg, Left lower leg, Right lower leg, Left upper leg(Pt declined completing secondary to stating it was done earlier)   Bathing assist Assist Level: Contact Guard/Touching assist     Upper Body Dressing/Undressing Upper body dressing Upper body dressing/undressing activity did not occur (including orthotics): N/A What is the patient wearing?: Pull over shirt    Upper body assist Assist Level: Minimal Assistance - Patient > 75%    Lower Body Dressing/Undressing Lower body dressing      What is the patient wearing?: Underwear/pull up, Pants, Incontinence brief     Lower body assist Assist for lower body dressing: Minimal Assistance - Patient > 75%     Toileting Toileting    Toileting assist Assist for toileting: Minimal Assistance - Patient > 75%     Transfers Chair/bed transfer  Transfers assist     Chair/bed transfer assist level: Contact Guard/Touching assist     Locomotion Ambulation   Ambulation assist      Assist level: Moderate Assistance - Patient 50 - 74% Assistive device: No Device Max distance: 169f   Walk 10 feet activity   Assist     Assist level: Moderate Assistance - Patient - 50 - 74% Assistive device: No Device   Walk 50 feet activity   Assist    Assist level: Moderate Assistance - Patient - 50 - 74% Assistive device: No Device    Walk 150 feet activity   Assist Walk 150 feet activity  did not occur: Safety/medical concerns(High fatigue level)  Assist level: Moderate Assistance - Patient - 50 - 74% Assistive device: No Device    Walk 10 feet on uneven surface  activity   Assist     Assist level: Maximal Assistance - Patient 25 - 49%(mod A for ramp, max A when ambulating over mulch) Assistive device:  Other (comment)(no AD)   Wheelchair     Assist Will patient use wheelchair at discharge?: No             Wheelchair 50 feet with 2 turns activity    Assist            Wheelchair 150 feet activity     Assist          Blood pressure 122/65, pulse 69, temperature 98.3 F (36.8 C), temperature source Oral, resp. rate 16, height 6' (1.829 m), weight 84.8 kg, SpO2 99 %.    Medical Problem List and Plan: 1.  Impaired ADLs/mobility and function secondary to acute on chronic SDH with 5 mm of midline shift to left- with R hemiparesis and dysarthria- f/u with NSU after rehab; no head CT unless has a change in function, per NSU  Continue CIR  Repeat head CT on 10/12 reviewed-stable.  Will hold off on repeat MRI at present given what appeared to be fluctuations secondary to nonacute events.  Will consider repeat MRI if necessary, not needed at this time. 2.  Antithrombotics: -DVT/anticoagulation:  Mechanical: Sequential compression devices, below knee Bilateral lower extremities and IVC filter             -antiplatelet therapy: N/A due to SDH 3. Pain Management:  Tylenol prn.  4. Mood: LCSW to follow for evaluation and support.              -antipsychotic agents: N/A 5. Neuropsych: This patient is not fully capable of making decisions on his own behalf. 6. Skin/Wound Care: Routine pressure relief measures.  7. Fluids/Electrolytes/Nutrition: Strict I/O. Labs per HD protocol.   9. Proteus bacteremia/Recent aspiration PNA: Treated with Zosyn and transitioned to cipro on 9/24 for 120 days total 10. Seizure disorder: On Vimpat and Lamictal bid.  Post HD on MWF needs additional doses of clonazepam 0.5 mg and Lamictal 150 mg to prevent breakthrough seizures.   No seizures from admission to CIR to 10/18 11. T2DM with hyperglycemia: Monitor BS ac/hs. Continue CBG (last 3)  Recent Labs    06/25/19 1704 06/25/19 2132 06/26/19 0622  GLUCAP 125* 132* 103*   Slightly labile on  10/18 12.  ESRD: Continue HD MWF at the end of the day to help with tolerance of therapy.  13. HTN: Monitor BP tid  Amlodipine increased to 7.5 daily, decreased to 2.5 on 10/ 18  Continue metoprolol bid.  14.  Anemia of chronic disease:  Hemoglobin 8.6 on 10/14, labs with HD 15. H/o A fib/PFO: Monitor HR tid--on metoprolol. Was on ASA only. No coumadin due to recurrent GIB and was on ASA--d/c due to SDH. Vitals:   06/26/19 0425 06/26/19 0803  BP: (!) 102/58 122/65  Pulse: 70 69  Resp: 16 16  Temp: 98.3 F (36.8 C)   SpO2: 99%    Heart rate controlled on 10/17 16. H/o SS thalassemia/PE/DVT: IVC filter placed 09/2015.  17. 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 Multiple myeloma- was scheduled for autologous stem cell  transplant at [facility]- will need f/u after rehab d/c. Labs also need to be monitored at least weekly for now. 19. B/L renal stones-Intermittent monitoring.  20. OSA on CPAP- CPAP as required for nighttime.  LOS: 12 days A FACE TO FACE EVALUATION WAS PERFORMED  Emie Sommerfeld Lorie Phenix 06/26/2019, 8:21 AM

## 2019-06-26 NOTE — Progress Notes (Signed)
Isaiah [name] Progress Note  Subjective: doing well  Vitals:   06/25/19 2243 06/26/19 0425 06/26/19 0803 06/26/19 1310  BP: (!) 107/59 (!) 102/58 122/65 128/71  Pulse: 65 70 69 74  Resp:  _0 Temp:  98.3 F (36.8 C)  98.2 F (36.8 C)  TempSrc:  Oral  Oral  SpO2:  99%  100%  Weight:  84.8 kg    Height:        Inpatient medications: . acyclovir  400 mg Oral QHS  . amLODipine  2.5 mg Oral QHS  . calcium carbonate  2 tablet Oral QHS  . Chlorhexidine Gluconate Cloth  6 each Topical Q0600  . cinacalcet  60 mg Oral QPM  . ciprofloxacin  500 mg Oral QPM  . clonazePAM  0.5 mg Oral Q M,W,F-HD  . darbepoetin (ARANESP) injection - DIALYSIS  60 mcg Intravenous Q Fri-HD  . fluticasone furoate-vilanterol  1 puff Inhalation Daily  . insulin aspart  0-5 Units Subcutaneous QHS  . insulin aspart  0-9 Units Subcutaneous TID WC  . lacosamide  150 mg Oral BID  . lacosamide  150 mg Oral Q M,W,F-2000  . lamoTRIgine  150 mg Oral BID  . metoprolol tartrate  12.5 mg Oral BID  . multivitamin  1 tablet Oral QHS  . pantoprazole  40 mg Oral Daily   . sodium chloride    . sodium chloride     sodium chloride, sodium chloride, acetaminophen, bisacodyl, calcium carbonate, camphor-menthol **AND** hydrOXYzine, diphenhydrAMINE, docusate sodium, feeding supplement (NEPRO CARB STEADY), guaiFENesin-dextromethorphan, prochlorperazine **OR** prochlorperazine **OR** prochlorperazine, prochlorperazine, senna-docusate, simethicone, sorbitol, traZODone, zolpidem    Exam:  alert, NF, no distress  chest cta bilat  Cor reg no mrg   Abd obese soft ntnd   Ext no leg edema   RUE AVF +b/t    Dialysis: MWF   4h  400/800   92kg   2/2 bath   NO HEP here due to SDH  mircera 225 q2wks, last ?  calcitriol 1.75 ug tiw  venofer 50 /wk    Assessment/ Plan:   ESRD-Monday Wednesday Friday dialysis no heparin secondary to subdural hemorrhage. Next dialysis planned for 06/27/2019  Altered mental  status/acute on chronic subdural hemorrhage- seen by neurology / neurosurgery, no surgical intervention  History of Proteus bacteremia- currently using Cipro has nephrolithiasis with possibility that this could be potential source.  Seizure disorders - on lamictal bid, vimpat bid+ post-HD, and Klonopin which is just post-HD tiw  Hypertension/vol -  well-controlled now on amlodipine and Lopressor, no vol excess on exam, below dry wt lower at dc  Diabetes - as per primary team  Multiple myeloma - recent planned stem cell transplant cancelled secondary to subdural hemorrhage and bacteremia  Secondary hyperparathyroidism-  calcium at goal continue Sensipar.  Phosphorus low holding calcium acetate  Anemia ckd - Hb 8- 10, continues on darbepoetin 60 mcg dose every Friday  Disposition continue on rehab     Isaiah [name] 06/26/2019, 1:40 PM  Iron/TIBC/Ferritin/ %Sat    Component Value Date/Time   IRON 64 06/21/2019 0646   IRON 74 09/06/2015 1306   TIBC 228 (L) 06/21/2019 0646   TIBC 265 09/06/2015 1306   FERRITIN 1,560 (H) 06/21/2019 0646   FERRITIN 97 09/06/2015 1306   IRONPCTSAT 28 06/21/2019 0646   IRONPCTSAT 28 09/06/2015 1306   Recent Labs  Lab 06/22/19 1421  NA 137  K 4.1  CL 96*  CO2 26  GLUCOSE 123*  BUN  41*  CREATININE 11.36*  CALCIUM 8.1*  PHOS 2.0*  ALBUMIN 3.4*   No results for input(s): AST, ALT, ALKPHOS, BILITOT, PROT in the last 168 hours. Recent Labs  Lab 06/22/19 1420  WBC 9.1  HGB 8.6*  HCT RESULTS UNAVAILABLE DUE TO INTERFERING SUBSTANCE  PLT 226

## 2019-06-27 ENCOUNTER — Other Ambulatory Visit: Payer: Self-pay | Admitting: Neurology

## 2019-06-27 ENCOUNTER — Other Ambulatory Visit: Payer: Self-pay | Admitting: Oncology

## 2019-06-27 ENCOUNTER — Inpatient Hospital Stay (HOSPITAL_COMMUNITY): Payer: 59 | Admitting: Speech Pathology

## 2019-06-27 ENCOUNTER — Inpatient Hospital Stay (HOSPITAL_COMMUNITY): Payer: 59 | Admitting: Physical Therapy

## 2019-06-27 ENCOUNTER — Inpatient Hospital Stay (HOSPITAL_COMMUNITY): Payer: 59 | Admitting: Occupational Therapy

## 2019-06-27 ENCOUNTER — Other Ambulatory Visit: Payer: Self-pay | Admitting: Internal Medicine

## 2019-06-27 DIAGNOSIS — T7840XA Allergy, unspecified, initial encounter: Secondary | ICD-10-CM | POA: Insufficient documentation

## 2019-06-27 HISTORY — DX: Allergy, unspecified, initial encounter: T78.40XA

## 2019-06-27 LAB — GLUCOSE, CAPILLARY
Glucose-Capillary: 80 mg/dL (ref 70–99)
Glucose-Capillary: 177 mg/dL — ABNORMAL HIGH (ref 70–99)
Glucose-Capillary: 142 mg/dL — ABNORMAL HIGH (ref 70–99)
Glucose-Capillary: 72 mg/dL (ref 70–99)
Glucose-Capillary: 121 mg/dL — ABNORMAL HIGH (ref 70–99)

## 2019-06-27 LAB — RENAL FUNCTION PANEL
Albumin: 3.7 g/dL (ref 3.5–5.0)
Anion gap: 14 (ref 5–15)
BUN: 50 mg/dL — ABNORMAL HIGH (ref 6–20)
CO2: 25 mmol/L (ref 22–32)
Calcium: 7.5 mg/dL — ABNORMAL LOW (ref 8.9–10.3)
Chloride: 95 mmol/L — ABNORMAL LOW (ref 98–111)
Creatinine, Ser: 11.36 mg/dL — ABNORMAL HIGH (ref 0.61–1.24)
GFR calc Af Amer: 5 mL/min — ABNORMAL LOW (ref >60)
GFR calc non Af Amer: 4 mL/min — ABNORMAL LOW (ref >60)
Glucose, Bld: 133 mg/dL — ABNORMAL HIGH (ref 70–99)
Phosphorus: 2.4 mg/dL — ABNORMAL LOW (ref 2.5–4.6)
Potassium: 3.9 mmol/L (ref 3.5–5.1)
Sodium: 134 mmol/L — ABNORMAL LOW (ref 135–145)

## 2019-06-27 LAB — CBC
Hemoglobin: 8.8 g/dL — ABNORMAL LOW (ref 13.0–17.0)
Platelets: 217 10*3/uL (ref 150–400)
WBC: 6.8 10*3/uL (ref 4.0–10.5)

## 2019-06-27 MED ORDER — CLONAZEPAM 0.5 MG PO TABS
ORAL_TABLET | ORAL | Status: AC
  Administered 2019-06-27: 19:00:00
  Filled 2019-06-27: qty 1

## 2019-06-27 NOTE — Progress Notes (Signed)
Speech Language Pathology Daily Session Note  Patient Details  Name: Isaiah [name] [facility] MRN: [id number] Date of Birth: 1965-09-17  Today's Date: 06/27/2019 SLP Individual Time: 8413-2440 SLP Individual Time Calculation (min): 56 min  Short Term Goals: Week 2: SLP Short Term Goal 1 (Week 2): STG=LTG due to ELOS  Skilled Therapeutic Interventions: Pt was seen for skilled ST targeting cognitive goals. Pt was received sitting in recliner finishing up OT session. SLP provided Min A verbal cues for sequencing as pt donned sweatshirt, Supervision A for donning socks. Pt recalled details of most recent ST session with only Supervision A question cues. SLP facilitated session with a simplified monthly calendar scheduling task. Pt was unable to write legibly, therefore accommodations made for writing. Pt sustained attention throughout entire 56 minute session with only 1 verbal cue for redirection. Min A verbal and visual cues provided for pt to locate correct dates on the calendar to record basic appointments (ex: gas bill due on 5th). Semi-complex scheduling tasks (ex: schedule a follow up appointment 3 days after chest xray) required Mod A verbal and visual cues for organization as well as error awareness and correction. In functional conversation regarding discharge, pt identified 2 ways to prevent falls at home (use hand rails on stairs, always use walker). Pt expressed need for BM, therefore SLP assisted pt in transferring from recliner to standing with walker, ambulating to restroom, and sitting on toilet with Min A verbal cues for sequencing. Pt was left in restroom with NT present to further assist with toileting needs. Continue per current plan of care.       Pain Pain Assessment Pain Scale: Faces Pain Score: 0-No pain Faces Pain Scale: No hurt  Therapy/Group: Individual Therapy  Arbutus Leas 06/27/2019, 12:23 PM

## 2019-06-27 NOTE — Progress Notes (Signed)
Occupational Therapy Session Note  Patient Details  Name: Isaiah [name]: [id number] Date of Birth: Feb 05, 1966  Today's Date: 06/27/2019 OT Individual Time: 5638-7564 OT Individual Time Calculation (min): 78 min    Short Term Goals: Week 2:  OT Short Term Goal 1 (Week 2): Continue working on established supervision level goals set for long term..  Skilled Therapeutic Interventions/Progress Updates:    Pt seen sitting in the bedside recliner.  Noted pt with slower and more garbled speech than previous session.  Had him stand to complete transfer to the shower with use of the RW.  Pt with increased forward lean and LOB when attempting to walk with the RW.  He needed max assist to maintain his balance to keep from falling forward.  Returned to the bedside recliner at the same level after resting on the 3:1 in the bathroom.  BP taken in sitting at 129/80 with HR at 67 with O2 sats at 100% on room air.  Pt reporting that he felt dizzy when up and felt like he was going to fall into the door.  Re-attempted X2 to transfer to the shower with the same result, and pt needing max assist to maintain balance.  Nursing made aware and CBG taken at 177.  PA also noted of change of status as well.  As time went on pt was insistent on trying the shower and that he was feeling a little better than earlier in the session.  Mod assist for transfer to the tub bench with use of the RW this attempt.  He completed bathing sit to stand with min assist overall and returned back out to the bedside recliner with mod assist.  Noted improvement from start of session, however pt still moving slower with head flexed in sitting and with mobility.  He was able to complete donning pullover shirt with setup.  Increased time and max assist needed for LB dressing secondary to decreased time.  Pt left in the bedside recliner with SLP.    Therapy Documentation Precautions:  Precautions Precautions: Fall Precaution Comments: R  inattention Restrictions Weight Bearing Restrictions: No  Vital Signs: Therapy Vitals Resp: 18 BP: (!) 105/59 Patient Position (if appropriate): Standing Pain: Pain Assessment Pain Scale: Faces Pain Score: 0-No pain Faces Pain Scale: No hurt ADL: See Care Tool Section for some details of ADL tasks  Therapy/Group: Individual Therapy  Brynn Mulgrew OTR/L 06/27/2019, 12:26 PM

## 2019-06-27 NOTE — Progress Notes (Signed)
Patient is stable, plan to go to dialysis at noon.

## 2019-06-27 NOTE — Plan of Care (Signed)
  Problem: Consults Goal: RH BRAIN INJURY PATIENT EDUCATION Description: Description: See Patient Education module for eduction specifics Outcome: Progressing Goal: Diabetes Guidelines if Diabetic/Glucose > 140 Description: If diabetic or lab glucose is > 140 mg/dl - Initiate Diabetes/Hyperglycemia Guidelines & Document Interventions  Outcome: Progressing   Problem: RH BOWEL ELIMINATION Goal: RH STG MANAGE BOWEL WITH ASSISTANCE Description: STG Manage Bowel with mod I Assistance. Outcome: Progressing Flowsheets (Taken 06/27/2019 1459) STG: Pt will manage bowels with assistance: 3-Moderate assistance   Problem: RH SKIN INTEGRITY Goal: RH STG SKIN FREE OF INFECTION/BREAKDOWN Outcome: Progressing   Problem: RH SAFETY Goal: RH STG ADHERE TO SAFETY PRECAUTIONS W/ASSISTANCE/DEVICE Description: STG Adhere to Safety Precautions With mod I Assistance/Device. Outcome: Progressing Flowsheets (Taken 06/27/2019 1459) STG:Pt will adhere to safety precautions with assistance/device: 3-Moderate assistance   Problem: RH COGNITION-NURSING Goal: RH STG ANTICIPATES NEEDS/CALLS FOR ASSIST W/ASSIST/CUES Description: STG Anticipates Needs/Calls for Assist With mod I Assistance/Cues. Outcome: Progressing Flowsheets (Taken 06/27/2019 1459) STG: Anticipates needs/calls for assistance with assistance/cues: 4-Minimal assistance   Problem: RH PAIN MANAGEMENT Goal: RH STG PAIN MANAGED AT OR BELOW PT'S PAIN GOAL Description: Less than 3 out of 10 Outcome: Progressing   Problem: RH KNOWLEDGE DEFICIT BRAIN INJURY Goal: RH STG INCREASE KNOWLEDGE OF SELF CARE AFTER BRAIN INJURY Description: Patient able to demonstrate management of medications to control complications associated with hematoma/seizures Outcome: Progressing

## 2019-06-27 NOTE — Progress Notes (Signed)
[facility] PHYSICAL MEDICINE & REHABILITATION PROGRESS NOTE   Subjective/Complaints: Patient reports therapy going "OK"- overall being here is going well- was dizzy somewhat on the weekend- but has resolved. Said he's to be d/c'd Thursday.   ROS: Denies CP, SOB, N/V/D  Objective: No results found. No results for input(s): WBC, HGB, HCT, PLT in the last 72 hours. No results for input(s): NA, K, CL, CO2, GLUCOSE, BUN, CREATININE, CALCIUM in the last 72 hours.  Intake/Output Summary (Last 24 hours) at 06/27/2019 1100 Last data filed at 06/27/2019 0700 Gross per 24 hour  Intake 360 ml  Output -  Net 360 ml     Physical Exam: Vital Signs Blood pressure 123/69, pulse 66, temperature 98.5 F (36.9 C), temperature source Oral, resp. rate 16, height 6' (1.829 m), weight 83.4 kg, SpO2 98 %. Constitutional: No distress . Vital signs reviewed. HENT: Normocephalic.  Atraumatic. Eyes: EOMI. No discharge. Dysconjugate gaze Cardiovascular: No JVD. Respiratory: Normal effort.  No stridor. GI: Non-distended. Skin: Warm and dry.  Intact. Psych: Normal mood.  Normal behavior. Slightly slow to process/answer questions Musc: No edema in extremities.  No tenderness in extremities. Neurologic: Alert Dysarthria Facial weakness Motor: LUE/LLE: 5/5 proximal distal RUE: 4+/5 proximally, 4-4+/5 right hand RLE: 4+/5 proximal distal, stable  Assessment/Plan: 1. Functional deficits secondary to SDH which require 3+ hours per day of interdisciplinary therapy in a comprehensive inpatient rehab setting.  Physiatrist is providing close team supervision and 24 hour management of active medical problems listed below.  Physiatrist and rehab team continue to assess barriers to discharge/monitor patient progress toward functional and medical goals  Care Tool:  Bathing    Body parts bathed by patient: Right arm, Left arm, Chest, Abdomen, Face, Left lower leg, Right lower leg, Left upper leg, Front  perineal area, Buttocks, Right upper leg   Body parts bathed by helper: Left lower leg, Right lower leg, Left upper leg, Right upper leg, Buttocks, Front perineal area, Abdomen, Chest, Left arm, Right arm Body parts n/a: Front perineal area, Buttocks, Right upper leg, Left lower leg, Right lower leg, Left upper leg(Pt declined completing secondary to stating it was done earlier)   Bathing assist Assist Level: Contact Guard/Touching assist     Upper Body Dressing/Undressing Upper body dressing Upper body dressing/undressing activity did not occur (including orthotics): N/A What is the patient wearing?: Pull over shirt    Upper body assist Assist Level: Minimal Assistance - Patient > 75%    Lower Body Dressing/Undressing Lower body dressing      What is the patient wearing?: Underwear/pull up, Pants, Incontinence brief     Lower body assist Assist for lower body dressing: Minimal Assistance - Patient > 75%     Toileting Toileting    Toileting assist Assist for toileting: Minimal Assistance - Patient > 75%     Transfers Chair/bed transfer  Transfers assist     Chair/bed transfer assist level: Moderate Assistance - Patient 50 - 74%     Locomotion Ambulation   Ambulation assist      Assist level: Contact Guard/Touching assist Assistive device: Walker-rolling Max distance: 80 ft   Walk 10 feet activity   Assist     Assist level: Contact Guard/Touching assist Assistive device: Walker-rolling   Walk 50 feet activity   Assist    Assist level: Contact Guard/Touching assist Assistive device: Walker-rolling    Walk 150 feet activity   Assist Walk 150 feet activity did not occur: Safety/medical concerns(High fatigue level)  Assist  level: Moderate Assistance - Patient - 50 - 74% Assistive device: No Device    Walk 10 feet on uneven surface  activity   Assist     Assist level: Maximal Assistance - Patient 25 - 49%(mod A for ramp, max A when  ambulating over mulch) Assistive device: Other (comment)(no AD)   Wheelchair     Assist Will patient use wheelchair at discharge?: No             Wheelchair 50 feet with 2 turns activity    Assist            Wheelchair 150 feet activity     Assist          Blood pressure 123/69, pulse 66, temperature 98.5 F (36.9 C), temperature source Oral, resp. rate 16, height 6' (1.829 m), weight 83.4 kg, SpO2 98 %.    Medical Problem List and Plan: 1.  Impaired ADLs/mobility and function secondary to acute on chronic SDH with 5 mm of midline shift to left- with R hemiparesis and dysarthria- f/u with NSU after rehab; no head CT unless has a change in function, per NSU  Continue CIR  Repeat head CT on 10/12 reviewed-stable.  Will hold off on repeat MRI at present given what appeared to be fluctuations secondary to nonacute events.  Will consider repeat MRI if necessary, not needed at this time. 2.  Antithrombotics: -DVT/anticoagulation:  Mechanical: Sequential compression devices, below knee Bilateral lower extremities and IVC filter             -antiplatelet therapy: N/A due to SDH 3. Pain Management:  Tylenol prn.  4. Mood: LCSW to follow for evaluation and support.              -antipsychotic agents: N/A 5. Neuropsych: This patient is not fully capable of making decisions on his own behalf. 6. Skin/Wound Care: Routine pressure relief measures.  7. Fluids/Electrolytes/Nutrition: Strict I/O. Labs per HD protocol.   9. Proteus bacteremia/Recent aspiration PNA: Treated with Zosyn and transitioned to cipro on 9/24 for 120 days total- will ask primary team how long on ABX? 10. Seizure disorder: On Vimpat and Lamictal bid.  Post HD on MWF needs additional doses of clonazepam 0.5 mg and Lamictal 150 mg to prevent breakthrough seizures.   No seizures from admission to CIR to 10/18 11. T2DM with hyperglycemia: Monitor BS ac/hs. Continue CBG (last 3)  Recent Labs     06/26/19 2121 06/27/19 0630 06/27/19 1007  GLUCAP 157* 80 177*   Slightly labile on 10/19 12.  ESRD: Continue HD MWF at the end of the day to help with tolerance of therapy.  13. HTN: Monitor BP tid  Amlodipine increased to 7.5 daily, decreased to 2.5 on 10/ 18  Continue metoprolol bid.  14.  Anemia of chronic disease:  Hemoglobin 8.6 on 10/14, labs with HD 15. H/o A fib/PFO: Monitor HR tid--on metoprolol. Was on ASA only. No coumadin due to recurrent GIB and was on ASA--d/c due to SDH. Vitals:   06/26/19 2046 06/27/19 0420  BP: 127/71 123/69  Pulse: 88 66  Resp: 18 16  Temp: 98.8 F (37.1 C) 98.5 F (36.9 C)  SpO2: 100% 98%   Heart rate controlled on 10/19 16. H/o SS thalassemia/PE/DVT: IVC filter placed 09/2015.  17. 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 Multiple myeloma- was scheduled for autologous stem  cell transplant at [name]- will need f/u after rehab d/c. Labs also need to be monitored at least weekly for now. 19. B/L renal stones-Intermittent monitoring.  20. OSA on CPAP- CPAP as required for nighttime.  LOS: 13 days A FACE TO FACE EVALUATION WAS PERFORMED  Kaylei Frink 06/27/2019, 11:00 AM

## 2019-06-27 NOTE — Procedures (Deleted)
   I was present at this dialysis session, have reviewed the session itself and made  appropriate changes Kelly Splinter MD [place] pager [phone]   06/27/2019, 3:59 PM

## 2019-06-27 NOTE — Plan of Care (Signed)
  Problem: RH Balance Goal: LTG Patient will maintain dynamic standing with ADLs (OT) Description: LTG:  Patient will maintain dynamic standing balance with assist during activities of daily living (OT)  Flowsheets (Taken 06/27/2019 1233) LTG: Pt will maintain dynamic standing balance during ADLs with: (Goal downgraded based on fluctuating progress) Minimal Assistance - Patient > 75%   Problem: Sit to Stand Goal: LTG:  Patient will perform sit to stand in prep for activites of daily living with assistance level (OT) Description: LTG:  Patient will perform sit to stand in prep for activites of daily living with assistance level (OT) Flowsheets (Taken 06/27/2019 1233) LTG: PT will perform sit to stand in prep for activites of daily living with assistance level: (Goal downgraded based on fluctuating progress) Minimal Assistance - Patient > 75%   Problem: RH Bathing Goal: LTG Patient will bathe all body parts with assist levels (OT) Description: LTG: Patient will bathe all body parts with assist levels (OT) Flowsheets (Taken 06/27/2019 1233) LTG: Pt will perform bathing with assistance level/cueing: (goal downgraded based on fluctuating progress) Minimal Assistance - Patient > 75%   Problem: RH Dressing Goal: LTG Patient will perform lower body dressing w/assist (OT) Description: LTG: Patient will perform lower body dressing with assist, with/without cues in positioning using equipment (OT) Flowsheets (Taken 06/27/2019 1233) LTG: Pt will perform lower body dressing with assistance level of: (Goal downgraded based on fluctuating progress) Minimal Assistance - Patient > 75%   Problem: RH Toileting Goal: LTG Patient will perform toileting task (3/3 steps) with assistance level (OT) Description: LTG: Patient will perform toileting task (3/3 steps) with assistance level (OT)  Flowsheets (Taken 06/27/2019 1233) LTG: Pt will perform toileting task (3/3 steps) with assistance level: (Goal downgraded  based on fluctuating progress) Minimal Assistance - Patient > 75%   Problem: RH Toilet Transfers Goal: LTG Patient will perform toilet transfers w/assist (OT) Description: LTG: Patient will perform toilet transfers with assist, with/without cues using equipment (OT) Flowsheets (Taken 06/27/2019 1233) LTG: Pt will perform toilet transfers with assistance level of: (Goal dowgraded based on fluctuating progress) Minimal Assistance - Patient > 75%   Problem: RH Tub/Shower Transfers Goal: LTG Patient will perform tub/shower transfers w/assist (OT) Description: LTG: Patient will perform tub/shower transfers with assist, with/without cues using equipment (OT) Flowsheets (Taken 06/27/2019 1233) LTG: Pt will perform tub/shower stall transfers with assistance level of: (Goal downgraded based on fluctuating progress.) Minimal Assistance - Patient > 75%

## 2019-06-27 NOTE — Progress Notes (Signed)
Nutrition Follow-up  DOCUMENTATION CODES:   Not applicable  INTERVENTION:   - Snacks TID between meals  -Nepro Shake poTID PRN, each supplement provides 425 kcal and 19 grams protein  - Continue rena-vit daily  - Encourage adequate PO intake  NUTRITION DIAGNOSIS:   Increased nutrient needs related to chronic illness (ESRD on HD) as evidenced by estimated needs.  Ongoing, being addressed via snacks and oral nutrition supplements  GOAL:   Patient will meet greater than or equal to 90% of their needs, Weight gain  Progressing  MONITOR:   PO intake, Supplement acceptance, Labs, Weight trends, I & O's  REASON FOR ASSESSMENT:   Malnutrition Screening Tool    ASSESSMENT:   53 year old male with PMH of T2DM, atrial fibrillation, ESRD on HD, CVA 2017, SS thalassemia, DVT/PE, GIB, seizure disorder, MM with recent admission to [facility] 05/26/19  for stem cell transplant but procedure held due to findings of proteus mirabilis bacteremia, aspiration PNA, acute on chronic SDH. Pt was discharged to home on 06/07/19 on Cipro but has had progressive weakness with inability to tolerate POs. Pt developed slurred speech with increase in right-sided weakness, worsening of right facial weakness with slurred speech, inability to walk and missed dialysis day. CT head done revealing 1.5 cm acute on chronic SDH with 4 mm midline shift and chronic left/right frontal, right temporal, occipital and bilateral cerebellar infracts. MRI brain showed right chronic SDH with acute features, 14 mm thickness in parietal lobe and 9 mm thickness in frontal lobe with 5 mm midline shift, negative for acute infarct and generalized atrophy. Pt admitted to CIR on 10/6.  Noted target d/c date of 10/22.  Pt continues with HD on MWF schedule.  EDW per nephrology: 92 kg HD net UF 10/16: 2000 ml Post-HD weight 10/16: 80.8 kg  Unable to speak with pt today due to dialysis schedule.  Reviewed weights. Overall,  weight down 4 lbs since admit but has fluctuated back and forth between 178-192 lbs since admit.  Meal Completion: 50-100% (averaging 74%)  Medications reviewed and include: calcium carbonate, Sensipar, Aranesp, SSI, rena-vit, Protonix  Labs reviewed: phosphorus 2.0, hemoglobin 8.6 CBG's: 80-177 x 24 hours  Diet Order:   Diet Order            Diet renal/carb modified with fluid restriction Diet-HS Snack? Nothing; Fluid restriction: 1200 mL Fluid; Room service appropriate? Yes; Fluid consistency: Thin  Diet effective now              EDUCATION NEEDS:   Education needs have been addressed  Skin:  Skin Assessment: Reviewed RN Assessment  Last BM:  06/27/19  Height:   Ht Readings from Last 1 Encounters:  06/14/19 6' (1.829 m)    Weight:   Wt Readings from Last 1 Encounters:  06/27/19 83.4 kg    Ideal Body Weight:  80.9 kg  BMI:  Body mass index is 24.94 kg/m.  Estimated Nutritional Needs:   Kcal:  2000-2200  Protein:  105-125 grams  Fluid:  1000 ml + UOP    Gaynell Face, MS, RD, LDN Inpatient Clinical Dietitian Pager: [phone] Weekend/After Hours: [phone]

## 2019-06-27 NOTE — Procedures (Signed)
   I was present at this dialysis session, have reviewed the session itself and made  appropriate changes Kelly Splinter MD [place] pager [phone]   06/27/2019, 3:55 PM

## 2019-06-27 NOTE — Progress Notes (Signed)
Physical Therapy Session Note  Patient Details  Name: Isaiah [name] MRN: [id number] Date of Birth: August 23, 1966  Today's Date: 06/27/2019 PT Individual Time: 0805-0907 PT Individual Time Calculation (min): 62 min   Short Term Goals: Week 2:  PT Short Term Goal 1 (Week 2): STG = LTG  Skilled Therapeutic Interventions/Progress Updates:    Pt received supine in bed and agreeable to therapy session. Therapist discussed having pt's wife come in for family education and pt called his wife - therapist spoke with pt's wife and educated her on pt's CLOF and intermittent strong anterior loss of balances that require max assist to maintain upright and the need to always use gait belt and RW for all standing/ambulatory tasks - pt's wife verbalized understanding; therapist also discussed recommendation for pt to have w/c for community mobility or on days when he is requiring more assistance; however, pt's wife deferred receiving one at this time - pt's wife reports she will come in for family education/training tomorrow at [facility] notified. Supine>sit, HOB slightly elevated and using bedrails, with supervision and increased time. Ambulated ~62f to sink using RW with min assist for steadying. Performed oral hygiene standing at sink with CGA. Ambulated ~1531fto therapy gym and ~8052fsing RW with CGA for steadying - pt demonstrates significantly decreased gait speed with associated decreased B LE step length and stride length. Ascended/descended 12 steps using bilateral handrails with CGA for steadying - ascended with reciprocal pattern and descended with step-to pattern leading with L LE. Donned harness to use with maxisky for increased balance challenges - forward ambulation, no AD, with cuing for increased B LE step length 65f53f and lateral sidestepping 65ft51f with pt demonstrating increased difficulty stepping towards R compared to L; and ambulatory cone weaving, no AD, with pt demonstrating minor LOB  when weaving around L side of cone. Ambulated ~150ft 60fg RW back to room with CGA for steadying and pt again demonstrating above gait impairments with cuing for improvements. Pt left sitting in recliner with needs in reach and chair alarm on.   Therapy Documentation Precautions:  Precautions Precautions: Fall Precaution Comments: R inattention Restrictions Weight Bearing Restrictions: No  Pain: Denies pain during session.   Therapy/Group: Individual Therapy  Varick Keys Tawana ScaleDPT 06/27/2019, 7:49 AM

## 2019-06-28 ENCOUNTER — Ambulatory Visit (HOSPITAL_COMMUNITY): Payer: 59 | Admitting: Physical Therapy

## 2019-06-28 ENCOUNTER — Inpatient Hospital Stay (HOSPITAL_COMMUNITY): Payer: 59 | Admitting: Speech Pathology

## 2019-06-28 ENCOUNTER — Telehealth: Payer: Self-pay | Admitting: Oncology

## 2019-06-28 ENCOUNTER — Inpatient Hospital Stay (HOSPITAL_COMMUNITY): Payer: 59 | Admitting: Occupational Therapy

## 2019-06-28 LAB — GLUCOSE, CAPILLARY
Glucose-Capillary: 78 mg/dL (ref 70–99)
Glucose-Capillary: 148 mg/dL — ABNORMAL HIGH (ref 70–99)
Glucose-Capillary: 135 mg/dL — ABNORMAL HIGH (ref 70–99)
Glucose-Capillary: 119 mg/dL — ABNORMAL HIGH (ref 70–99)

## 2019-06-28 MED ORDER — CHLORHEXIDINE GLUCONATE CLOTH 2 % EX PADS: 6.0000 | MEDICATED_PAD | Freq: Every day | CUTANEOUS | Status: DC

## 2019-06-28 NOTE — Progress Notes (Signed)
[facility] PHYSICAL MEDICINE & REHABILITATION PROGRESS NOTE   Subjective/Complaints: Patient seen sitting up in bed this morning working with therapies.  He states he slept well overnight, for being in the hospital.  He has questions regarding assembly of DME.  ROS: Denies CP, SOB, N/V/D  Objective: No results found. Recent Labs    06/27/19 1349  WBC 6.8  HGB 8.8*  HCT RESULTS UNAVAILABLE DUE TO INTERFERING SUBSTANCE  PLT 217   Recent Labs    06/27/19 1349  NA 134*  K 3.9  CL 95*  CO2 25  GLUCOSE 133*  BUN 50*  CREATININE 11.36*  CALCIUM 7.5*    Intake/Output Summary (Last 24 hours) at 06/28/2019 0839 Last data filed at 06/28/2019 0730 Gross per 24 hour  Intake 240 ml  Output 2003 ml  Net -1763 ml     Physical Exam: Vital Signs Blood pressure 111/72, pulse 64, temperature 98.6 F (37 C), temperature source Oral, resp. rate 18, height 6' (1.829 m), weight 83 kg, SpO2 97 %. Constitutional: No distress . Vital signs reviewed. HENT: Normocephalic.  Atraumatic. Eyes: EOMI. No discharge. Cardiovascular: No JVD. Respiratory: Normal effort.  No stridor. GI: Non-distended. Skin: Warm and dry.  Intact. Psych: Normal mood.  Normal behavior. Musc: No edema in extremities.  No tenderness in extremities. Neurologic: Alert Dysarthria Facial weakness Motor: LUE/LLE: 5/5 proximal distal RUE: 4+/5 proximally, 4+/5 right hand RLE: 4+/5 proximal distal, unchanged  Assessment/Plan: 1. Functional deficits secondary to SDH which require 3+ hours per day of interdisciplinary therapy in a comprehensive inpatient rehab setting.  Physiatrist is providing close team supervision and 24 hour management of active medical problems listed below.  Physiatrist and rehab team continue to assess barriers to discharge/monitor patient progress toward functional and medical goals  Care Tool:  Bathing    Body parts bathed by patient: Right arm, Left arm, Chest, Abdomen, Face, Left upper  leg, Front perineal area, Right upper leg   Body parts bathed by helper: Buttocks Body parts n/a: Left lower leg, Right lower leg(did not attempt washing feet)   Bathing assist Assist Level: Minimal Assistance - Patient > 75%     Upper Body Dressing/Undressing Upper body dressing Upper body dressing/undressing activity did not occur (including orthotics): N/A What is the patient wearing?: Pull over shirt    Upper body assist Assist Level: Supervision/Verbal cueing    Lower Body Dressing/Undressing Lower body dressing      What is the patient wearing?: Underwear/pull up, Pants, Incontinence brief     Lower body assist Assist for lower body dressing: Maximal Assistance - Patient 25 - 49%(secondary to decreased time)     Toileting Toileting    Toileting assist Assist for toileting: Minimal Assistance - Patient > 75%     Transfers Chair/bed transfer  Transfers assist     Chair/bed transfer assist level: Maximal Assistance - Patient 25 - 49%     Locomotion Ambulation   Ambulation assist      Assist level: Contact Guard/Touching assist Assistive device: Walker-rolling Max distance: 151f   Walk 10 feet activity   Assist     Assist level: Contact Guard/Touching assist Assistive device: Walker-rolling   Walk 50 feet activity   Assist    Assist level: Contact Guard/Touching assist Assistive device: Walker-rolling    Walk 150 feet activity   Assist Walk 150 feet activity did not occur: Safety/medical concerns(High fatigue level)  Assist level: Contact Guard/Touching assist Assistive device: Walker-rolling    Walk 10 feet on  uneven surface  activity   Assist     Assist level: Maximal Assistance - Patient 25 - 49%(mod A for ramp, max A when ambulating over mulch) Assistive device: Other (comment)(no AD)   Wheelchair     Assist Will patient use wheelchair at discharge?: No             Wheelchair 50 feet with 2 turns  activity    Assist            Wheelchair 150 feet activity     Assist          Blood pressure 111/72, pulse 64, temperature 98.6 F (37 C), temperature source Oral, resp. rate 18, height 6' (1.829 m), weight 83 kg, SpO2 97 %.    Medical Problem List and Plan: 1.  Impaired ADLs/mobility and function secondary to acute on chronic SDH with 5 mm of midline shift to left- with R hemiparesis and dysarthria- f/u with NSU after rehab; no head CT unless has a change in function, per NSU  Continue CIR  Repeat head CT on 10/12 reviewed-stable.  Will hold off on repeat MRI at present given what appeared to be fluctuations secondary to nonacute events.  Will consider repeat MRI if necessary, not needed at this time. 2.  Antithrombotics: -DVT/anticoagulation:  Mechanical: Sequential compression devices, below knee Bilateral lower extremities and IVC filter             -antiplatelet therapy: N/A due to SDH 3. Pain Management:  Tylenol prn.  4. Mood: LCSW to follow for evaluation and support.              -antipsychotic agents: N/A 5. Neuropsych: This patient is not fully capable of making decisions on his own behalf. 6. Skin/Wound Care: Routine pressure relief measures.  7. Fluids/Electrolytes/Nutrition: Strict I/O. Labs per HD protocol.   9. Proteus bacteremia/Recent aspiration PNA: Treated with Zosyn and transitioned to cipro on 9/24 for 120 days 10. Seizure disorder: On Vimpat and Lamictal bid.  Post HD on MWF needs additional doses of clonazepam 0.5 mg and Lamictal 150 mg to prevent breakthrough seizures.   No seizures from admission to CIR to 10/20 11. T2DM with hyperglycemia: Monitor BS ac/hs. Continue CBG (last 3)  Recent Labs    06/27/19 1829 06/27/19 2206 06/28/19 0633  GLUCAP 72 121* 78   Slightly labile on 10/20 12.  ESRD: Continue HD MWF at the end of the day to help with tolerance of therapy.  13. HTN: Monitor BP tid  Amlodipine increased to 7.5 daily, decreased to  2.5 on 10/ 18  Continue metoprolol bid.   Labile on 10/20 14.  Anemia of chronic disease:  Hemoglobin 8.8 on 10/19, labs with HD 15. H/o A fib/PFO: Monitor HR tid--on metoprolol. Was on ASA only. No coumadin due to recurrent GIB and was on ASA--d/c due to SDH. Vitals:   06/28/19 0544 06/28/19 0706  BP: (!) 109/35 111/72  Pulse: 66 64  Resp: 18   Temp: 98.6 F (37 C)   SpO2: 97%    Heart rate controlled on 10/20, likely erroneous reading/recording 16. H/o SS thalassemia/PE/DVT: IVC filter placed 09/2015.  17. 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 Multiple myeloma- was scheduled for autologous stem cell transplant at [facility]- will need f/u after rehab d/c. Labs also need to be monitored at least weekly for now. 19. B/L renal  stones-Intermittent monitoring.  20. OSA on CPAP- CPAP as required for nighttime.  LOS: 14 days A FACE TO FACE EVALUATION WAS PERFORMED  Isaiah [name] 06/28/2019, 8:39 AM

## 2019-06-28 NOTE — Progress Notes (Signed)
Physical Therapy Session Note  Patient Details  Name: [place] MRN: [id number] Date of Birth: [birth year]  Today's Date: 06/28/2019 PT Individual Time: 1107-1200 PT Individual Time Calculation (min): 53 min   Short Term Goals: Week 2:  PT Short Term Goal 1 (Week 2): STG = LTG  Skilled Therapeutic Interventions/Progress Updates:   Pt received sitting in recliner with his wife present for family education/training and pt agreeable to therapy session. Performed sit<>stands using RW with CGA for steadying/safety throughout session. Ambulated ~131f using RW to main therapy gym with CGA for steadying as pt demonstrates improved upright trunk posture. Therapist provided pt's wife education regarding use of gait belt and proper positioning and hand placement to provide pt assist to maintain balance - reinforced prior education regarding pt's intermittent strong anterior trunk lean but no occurences of that this session. Ascended/descended 4 steps using B HRs with therapist provided CGA and educating pt's wife regarding proper positioning and hand placement for assist on stairs - pt continues to demonstrate reciprocal pattern on ascent and step-to pattern leading with L LE on descent. Pt ascended/descended 8 more steps using same technique with his wife provided CGA and demonstrating understanding with minimal cuing from therapist. Pt ambulated ~1569fto car room using RW with pt's wife providing CGA; demonstrating understanding. Pt performed ambulatory simulated car transfer (SUV height) using RW with pt's wife providing proper CGA for steadying - therapist education regarding proper transfer technique of sitting first then putting feet in the vehicle. Pt ambulated ~1513f2 up/down ramp using RW with CGA/min assist from pt's wife. Ambulated ~87f79f over mulch, no AD, with pt's wife providing min assist for balance and therapist providing CGA for safety. Pt requesting to return to room but with  encouragement from his wife and therapist agreeable to participate in remainder of therapy session. Ambulated ~150ft47fng RW to main therapy gym with CGA from pt's wife. Performed dynamic standing balance task, with mirror feedback, of alternate B LE foot taps on 4" step without UE support with min assist for balance progressed to CGA w[place] therapist educated pt on using balance strategies to regain balance between taps. Progressed to stepping up/down on 4" step, no UE support, with min and intermittent mod assist for balance - max cuing for sequencing. Pt's wife educated on performing the following exercises with the patient at home: lateral sidestepping with B UE support on counter, standing with his back in a corner feet in semi-tandem position, and repeated sit<>stands without UE support - therapist educated her on proper set-up of exercises for safety and she deferred a printout as she wrote notes in her phone. Ambulated ~150ft 30f to room using RW with CGA/close supervision. Sit>stand RW>recliner with supervision. Pt left sitting in recliner with needs in reach and chair alarm on.  Therapy Documentation Precautions:  Precautions Precautions: Fall Precaution Comments: R inattention Restrictions Weight Bearing Restrictions: No  Pain: Denies pain during session.   Therapy/Group: Individual Therapy  Gyneth Hubka Tawana ScaleDPT 06/28/2019, 12:07 PM

## 2019-06-28 NOTE — Progress Notes (Signed)
[facility] Progress Note  Subjective: doing well, going home thursday  Vitals:   06/28/19 0500 06/28/19 0544 06/28/19 0706 06/28/19 1352  BP:  (!) 109/35 111/72 119/70  Pulse:  66 64 61  Resp:  18  16  Temp:  98.6 F (37 C)  98.4 F (36.9 C)  TempSrc:  Oral  Oral  SpO2:  97%  100%  Weight: 83 kg     Height:        Inpatient medications: . acyclovir  400 mg Oral QHS  . amLODipine  2.5 mg Oral QHS  . calcium carbonate  2 tablet Oral QHS  . Chlorhexidine Gluconate Cloth  6 each Topical Q0600  . Chlorhexidine Gluconate Cloth  6 each Topical Q0600  . cinacalcet  60 mg Oral QPM  . ciprofloxacin  500 mg Oral QPM  . clonazePAM  0.5 mg Oral Q M,W,F-HD  . darbepoetin (ARANESP) injection - DIALYSIS  60 mcg Intravenous Q Fri-HD  . fluticasone furoate-vilanterol  1 puff Inhalation Daily  . insulin aspart  0-5 Units Subcutaneous QHS  . insulin aspart  0-9 Units Subcutaneous TID WC  . lacosamide  150 mg Oral BID  . lacosamide  150 mg Oral Q M,W,F-2000  . lamoTRIgine  150 mg Oral BID  . metoprolol tartrate  12.5 mg Oral BID  . multivitamin  1 tablet Oral QHS  . pantoprazole  40 mg Oral Daily   . sodium chloride    . sodium chloride     sodium chloride, sodium chloride, acetaminophen, bisacodyl, calcium carbonate, camphor-menthol **AND** hydrOXYzine, diphenhydrAMINE, docusate sodium, feeding supplement (NEPRO CARB STEADY), guaiFENesin-dextromethorphan, prochlorperazine **OR** prochlorperazine **OR** prochlorperazine, prochlorperazine, senna-docusate, simethicone, sorbitol, traZODone, zolpidem    Exam:  alert, NF, no distress  chest cta bilat  Cor reg no mrg   Abd obese soft ntnd   Ext no leg edema   RUE AVF +b/t    Dialysis: MWF   4h  400/800   92kg   2/2 bath   NO HEP here due to SDH  mircera 225 q2wks, last ?  calcitriol 1.75 ug tiw  venofer 50 /wk    Assessment/ Plan:   ESRD-Monday Wednesday Friday dialysis no heparin secondary to subdural hemorrhage.  Next dialysis planned for 06/27/2019  Altered mental status/acute on chronic subdural hemorrhage- seen by neurology / neurosurgery, no surgical intervention  History of Proteus bacteremia- currently using Cipro has nephrolithiasis with possibility that this could be potential source.  Seizure disorders - on lamictal bid, vimpat bid+ post-HD, and Klonopin just post-HD tiw all for seizure prevention  Hypertension/vol -  well-controlled now on amlodipine and Lopressor, no vol excess on exam, below dry wt lower at dc  Diabetes - as per primary team  Multiple myeloma - recent planned stem cell transplant cancelled secondary to subdural hemorrhage and bacteremia  Secondary hyperparathyroidism-  calcium at goal continue Sensipar.  Phosphorus low holding calcium acetate  Anemia ckd - Hb 8- 10, continues on darbepoetin 60 mcg dose every Friday  Disposition continue on rehab     Rob Asmaa Tirpak 06/28/2019, 2:39 PM  Iron/TIBC/Ferritin/ %Sat    Component Value Date/Time   IRON 64 06/21/2019 0646   IRON 74 09/06/2015 1306   TIBC 228 (L) 06/21/2019 0646   TIBC 265 09/06/2015 1306   FERRITIN 1,560 (H) 06/21/2019 0646   FERRITIN 97 09/06/2015 1306   IRONPCTSAT 28 06/21/2019 0646   IRONPCTSAT 28 09/06/2015 1306   Recent Labs  Lab 06/27/19 1349  NA 134*  K 3.9  CL 95*  CO2 25  GLUCOSE 133*  BUN 50*  CREATININE 11.36*  CALCIUM 7.5*  PHOS 2.4*  ALBUMIN 3.7   No results for input(s): AST, ALT, ALKPHOS, BILITOT, PROT in the last 168 hours. Recent Labs  Lab 06/27/19 1349  WBC 6.8  HGB 8.8*  HCT RESULTS UNAVAILABLE DUE TO INTERFERING SUBSTANCE  PLT 217

## 2019-06-28 NOTE — Progress Notes (Signed)
Occupational Therapy Session Note  Patient Details  Name: Isaiah [name] MRN: [id number] Date of Birth: 31-Oct-1965  Today's Date: 06/28/2019 OT Individual Time: 4801-6553 OT Individual Time Calculation (min): 68 min    Short Term Goals: Week 2:  OT Short Term Goal 1 (Week 2): Continue working on established supervision level goals set for long term..  Skilled Therapeutic Interventions/Progress Updates:    Pt completed bathing and dressing during session.  Min guard assist for functional transfers using the RW from bed to the shower bench.  He was able to gather all clothing prior to starting with only min instructional cueing for staying inside of the RW.  He completed all undressing with min guard assist sit to stand.  He completed bathing sit to stand with min guard assist from the shower seat.  Min guard for transfer out to the EOB for dressing.  He completed all dressing with min guard assist as well.  He then ambulated down to the ADL apartment with min guard using the RW for support.  He then completed simulated walk-in shower transfer, stepping in posteriorly with use of the RW and then stepping out anteriorly, both with min guard.  He will need a shower seat for home, which he is aware of.  Social work has also been made aware and they will order.  Finished session with transfer back to the room with call button and phone in reach and pt sitting in the bedside recliner.   Therapy Documentation Precautions:  Precautions Precautions: Fall Precaution Comments: R inattention Restrictions Weight Bearing Restrictions: No   Pain: Pain Assessment Pain Scale: Faces Pain Score: 0-No pain ADL: See Care Tool Section for some details of ADL tasks.   Therapy/Group: Individual Therapy  Nyrie Sigal OTR/L 06/28/2019, 12:05 PM

## 2019-06-28 NOTE — Telephone Encounter (Signed)
Called and spoke with patients wife. confimed appt

## 2019-06-28 NOTE — Progress Notes (Signed)
Speech Language Pathology Daily Session Note  Patient Details  Name: Nicklas Mcsweeney [facility] MRN: [id number] Date of Birth: 19-Oct-1965  Today's Date: 06/28/2019 SLP Individual Time: 0732-0830 SLP Individual Time Calculation (min): 58 min  Short Term Goals: Week 2: SLP Short Term Goal 1 (Week 2): STG=LTG due to ELOS  Skilled Therapeutic Interventions: Pt was seen for skilled ST targeting cognitive goals. SLP facilitated session with functional conversation regarding complexity of pt's current medication regimine and recommendation for his wife to manage his medications - pt in agreement and stated his wife already managed organization and timing of medications at baseline. SLP further facilitated session with a familiar semi-complex card game (blink), which pt recalled 2/3 rules for with only Supervision A question and visual cues. Pt demonstrated selective attention for 30 mins with Supervision A for redirection. Min A verbal and visual cues provided for semi-complex problem solving, recall of instructions, and mental flexibility. Prior to end of session, demonstrated use of compensatory memory strategy (not taking) with Mod faded to Min A verbal cues to remember a question he wanted to ask his Occupational Therapist later in the day, as well as names of card games he wanted to purchase for home use. Continue per current plan of care.      Pain Pain Assessment Pain Scale: 0-10 Pain Score: 0-No pain  Therapy/Group: Individual Therapy  Arbutus Leas 06/28/2019, 9:45 AM

## 2019-06-29 ENCOUNTER — Other Ambulatory Visit: Payer: Self-pay

## 2019-06-29 ENCOUNTER — Encounter (HOSPITAL_COMMUNITY): Payer: 59 | Admitting: Psychology

## 2019-06-29 ENCOUNTER — Inpatient Hospital Stay (HOSPITAL_COMMUNITY): Payer: 59

## 2019-06-29 ENCOUNTER — Inpatient Hospital Stay (HOSPITAL_COMMUNITY): Payer: 59 | Admitting: Occupational Therapy

## 2019-06-29 ENCOUNTER — Inpatient Hospital Stay (HOSPITAL_COMMUNITY): Payer: 59 | Admitting: Speech Pathology

## 2019-06-29 LAB — GLUCOSE, CAPILLARY
Glucose-Capillary: 97 mg/dL (ref 70–99)
Glucose-Capillary: 131 mg/dL — ABNORMAL HIGH (ref 70–99)
Glucose-Capillary: 88 mg/dL (ref 70–99)

## 2019-06-29 LAB — CBC WITH DIFFERENTIAL/PLATELET
Abs Immature Granulocytes: 0 10*3/uL (ref 0.00–0.07)
Basophils Absolute: 0.2 10*3/uL — ABNORMAL HIGH (ref 0.0–0.1)
Basophils Relative: 3 %
Eosinophils Absolute: 0.8 10*3/uL — ABNORMAL HIGH (ref 0.0–0.5)
Eosinophils Relative: 13 %
Hemoglobin: 9.1 g/dL — ABNORMAL LOW (ref 13.0–17.0)
Lymphocytes Relative: 8 %
Lymphs Abs: 0.5 10*3/uL — ABNORMAL LOW (ref 0.7–4.0)
Monocytes Absolute: 0.5 10*3/uL (ref 0.1–1.0)
Monocytes Relative: 9 %
Neutro Abs: 4 10*3/uL (ref 1.7–7.7)
Neutrophils Relative %: 67 %
Platelets: 274 10*3/uL (ref 150–400)
WBC: 6 10*3/uL (ref 4.0–10.5)
nRBC: 2 /100 WBC — ABNORMAL HIGH

## 2019-06-29 LAB — RENAL FUNCTION PANEL
Albumin: 3.8 g/dL (ref 3.5–5.0)
Anion gap: 15 (ref 5–15)
BUN: 37 mg/dL — ABNORMAL HIGH (ref 6–20)
CO2: 25 mmol/L (ref 22–32)
Calcium: 7.5 mg/dL — ABNORMAL LOW (ref 8.9–10.3)
Chloride: 94 mmol/L — ABNORMAL LOW (ref 98–111)
Creatinine, Ser: 10.19 mg/dL — ABNORMAL HIGH (ref 0.61–1.24)
GFR calc Af Amer: 6 mL/min — ABNORMAL LOW (ref >60)
GFR calc non Af Amer: 5 mL/min — ABNORMAL LOW (ref >60)
Glucose, Bld: 125 mg/dL — ABNORMAL HIGH (ref 70–99)
Phosphorus: 3.2 mg/dL (ref 2.5–4.6)
Potassium: 3.7 mmol/L (ref 3.5–5.1)
Sodium: 134 mmol/L — ABNORMAL LOW (ref 135–145)

## 2019-06-29 MED FILL — VIMPAT 150 MG TABLET: 150 | 90 days supply | Qty: 225 | Fill #0

## 2019-06-29 MED FILL — BREO ELLIPTA 200-25 MCG INH: 200-25 | 30 days supply | Qty: 60 | Fill #0

## 2019-06-29 NOTE — Progress Notes (Signed)
Occupational Therapy Discharge Summary  Patient Details  Name: Isaiah [name] [facility] MRN: [id number] Date of Birth: 1965-10-30   Patient has met 12 of 12 long term goals due to improved activity tolerance, improved balance, postural control, ability to compensate for deficits, functional use of  RIGHT upper extremity, improved attention, improved awareness and improved coordination.  Patient to discharge at [facility] Assist level.  Patient's care partner is independent to provide the necessary physical and cognitive assistance at discharge.    Reasons goals not met: n/a  Recommendation:  Patient will benefit from ongoing skilled OT services in home health setting to continue to advance functional skills in the area of BADL and iADL.  Equipment: shower chair  Reasons for discharge: treatment goals met  Patient/family agrees with progress made and goals achieved: Yes  OT Discharge Precautions/Restrictions  Restrictions Weight Bearing Restrictions: No  ADL  S to min A overall with RW for support Vision Baseline Vision/History: Wears glasses;Cataracts Wears Glasses: At all times Patient Visual Report: No change from baseline Vision Assessment?: Yes Ocular Range of Motion: Restricted on the right Tracking/Visual Pursuits: Decreased smoothness of vertical tracking;Decreased smoothness of eye movement to RIGHT superior field;Unable to hold eye position out of midline Perception  Perception: Impaired Inattention/Neglect: Does not attend to right visual field Praxis Praxis: Intact Cognition Overall Cognitive Status: Impaired/Different from baseline Arousal/Alertness: Awake/alert Orientation Level: Oriented X4 Attention: Selective Focused Attention: Appears intact Sustained Attention: Appears intact Selective Attention: Impaired Selective Attention Impairment: Verbal basic;Functional basic Memory: Impaired Memory Impairment: Retrieval deficit;Decreased short term memory Decreased  Short Term Memory: Verbal basic Awareness: Impaired Awareness Impairment: Emergent impairment Problem Solving: Impaired Problem Solving Impairment: Verbal basic;Functional basic Executive Function: Writer: Impaired Organizing Impairment: Verbal basic;Functional basic Safety/Judgment: Impaired Sensation Sensation Light Touch: Appears Intact Hot/Cold: Appears Intact Proprioception: Appears Intact Stereognosis: Appears Intact Coordination Gross Motor Movements are Fluid and Coordinated: No Fine Motor Movements are Fluid and Coordinated: No Coordination and Movement Description: Grossly uncoordinated, but better when using RW Finger Nose Finger Test: Dysmetria R>L Heel Shin Test: [facility] Motor  Motor Motor: Hemiplegia;Abnormal postural alignment and control Motor - Skilled Clinical Observations: Grossly decreased R side>L Mobility    S - min A with RW Trunk/Postural Assessment  Thoracic Assessment Thoracic Assessment: Exceptions to WFL(kyphotic posture) Postural Control Postural Control: (delayed and needs UE support on RW for A)  Balance Balance Balance Assessed: Yes Standardized Balance Assessment Standardized Balance Assessment: Timed Up and Go Test Timed Up and Go Test TUG: Normal TUG Normal TUG (seconds): 34.3(Trial 1 47.5; Trial 2 29; Trial 3 26.5 using RW) Static Sitting Balance Static Sitting - Level of Assistance: 7: Independent Dynamic Sitting Balance Dynamic Sitting - Level of Assistance: 5: Stand by assistance Static Standing Balance Static Standing - Level of Assistance: 5: Stand by assistance Dynamic Standing Balance Dynamic Standing - Level of Assistance: 4: Min assist Extremity/Trunk Assessment RUE Assessment Passive Range of Motion (PROM) Comments: WFLs (decreased sh flexion to 120) Active Range of Motion (AROM) Comments: WFLs ( decreased sh flex to 120) General Strength Comments: 4-/5 LUE Assessment LUE Assessment: Within Functional  Limits   Isaiah,[name] 06/29/2019, 12:57 PM

## 2019-06-29 NOTE — Progress Notes (Signed)
[place] PHYSICAL MEDICINE & REHABILITATION PROGRESS NOTE   Subjective/Complaints: Patient seen sitting up in bed this AM, finishing with therapies.  He states he slept well overnight.  He is looking forward to discharge tomorrow.   ROS: Denies CP, SOB, N/V/D  Objective: No results found. Recent Labs    06/27/19 1349  WBC 6.8  HGB 8.8*  HCT RESULTS UNAVAILABLE DUE TO INTERFERING SUBSTANCE  PLT 217   Recent Labs    06/27/19 1349  NA 134*  K 3.9  CL 95*  CO2 25  GLUCOSE 133*  BUN 50*  CREATININE 11.36*  CALCIUM 7.5*    Intake/Output Summary (Last 24 hours) at 06/29/2019 0848 Last data filed at 06/28/2019 2215 Gross per 24 hour  Intake 620 ml  Output 0 ml  Net 620 ml     Physical Exam: Vital Signs Blood pressure 111/67, pulse 63, temperature 97.6 F (36.4 C), resp. rate 16, height 6' (1.829 m), weight 83 kg, SpO2 100 %. Constitutional: No distress . Vital signs reviewed. HENT: Normocephalic.  Atraumatic. Eyes: EOMI. No discharge. Cardiovascular: No JVD. Respiratory: Normal effort.  No stridor. GI: Non-distended. Skin: Warm and dry.  Intact. Psych: Normal mood.  Normal behavior. Musc: No edema in extremities.  No tenderness in extremities. Neurologic: Alert Dysarthria Facial weakness Motor: LUE/LLE: 5/5 proximal distal RUE: 4+/5 proximal to distal RLE: 4+/5 proximal to distal  Assessment/Plan: 1. Functional deficits secondary to SDH which require 3+ hours per day of interdisciplinary therapy in a comprehensive inpatient rehab setting.  Physiatrist is providing close team supervision and 24 hour management of active medical problems listed below.  Physiatrist and rehab team continue to assess barriers to discharge/monitor patient progress toward functional and medical goals  Care Tool:  Bathing    Body parts bathed by patient: Right arm, Left arm, Chest, Abdomen, Face, Left upper leg, Front perineal area, Right upper leg, Right lower leg, Left lower  leg, Buttocks   Body parts bathed by helper: Buttocks Body parts n/a: Left lower leg, Right lower leg(did not attempt washing feet)   Bathing assist Assist Level: Contact Guard/Touching assist     Upper Body Dressing/Undressing Upper body dressing Upper body dressing/undressing activity did not occur (including orthotics): N/A What is the patient wearing?: Pull over shirt    Upper body assist Assist Level: Supervision/Verbal cueing    Lower Body Dressing/Undressing Lower body dressing      What is the patient wearing?: Underwear/pull up, Pants, Incontinence brief     Lower body assist Assist for lower body dressing: Contact Guard/Touching assist     Toileting Toileting    Toileting assist Assist for toileting: Minimal Assistance - Patient > 75%     Transfers Chair/bed transfer  Transfers assist     Chair/bed transfer assist level: Contact Guard/Touching assist     Locomotion Ambulation   Ambulation assist      Assist level: Contact Guard/Touching assist Assistive device: Walker-rolling Max distance: 123f   Walk 10 feet activity   Assist     Assist level: Contact Guard/Touching assist Assistive device: Walker-rolling   Walk 50 feet activity   Assist    Assist level: Contact Guard/Touching assist Assistive device: Walker-rolling    Walk 150 feet activity   Assist Walk 150 feet activity did not occur: Safety/medical concerns(High fatigue level)  Assist level: Contact Guard/Touching assist Assistive device: Walker-rolling    Walk 10 feet on uneven surface  activity   Assist     Assist level: Maximal Assistance -  Patient 25 - 49%(mod A for ramp, max A when ambulating over mulch) Assistive device: Other (comment)(no AD)   Wheelchair     Assist Will patient use wheelchair at discharge?: No             Wheelchair 50 feet with 2 turns activity    Assist            Wheelchair 150 feet activity     Assist           Blood pressure 111/67, pulse 63, temperature 97.6 F (36.4 C), resp. rate 16, height 6' (1.829 m), weight 83 kg, SpO2 100 %.    Medical Problem List and Plan: 1.  Impaired ADLs/mobility and function secondary to acute on chronic SDH with 5 mm of midline shift to left- with R hemiparesis and dysarthria- f/u with NSU after rehab; no head CT unless has a change in function, per NSU  Continues CIR  Repeat head CT on 10/12 reviewed-stable.  Will hold off on repeat MRI at present given what appeared to be fluctuations secondary to nonacute events.  Will consider repeat MRI if necessary, not needed at this time.  Team conference today to discuss current and goals and coordination of care, home and environmental barriers, and discharge planning with nursing, case manager, and therapies.  2.  Antithrombotics: -DVT/anticoagulation:  Mechanical: Sequential compression devices, below knee Bilateral lower extremities and IVC filter             -antiplatelet therapy: N/A due to SDH 3. Pain Management:  Tylenol prn.  4. Mood: LCSW to follow for evaluation and support.              -antipsychotic agents: N/A 5. Neuropsych: This patient is not fully capable of making decisions on his own behalf. 6. Skin/Wound Care: Routine pressure relief measures.  7. Fluids/Electrolytes/Nutrition: Strict I/O. Labs per HD protocol.   9. Proteus bacteremia/Recent aspiration PNA: Treated with Zosyn and transitioned to cipro on 9/24 for 120 days 10. Seizure disorder: On Vimpat and Lamictal bid.  Post HD on MWF needs additional doses of clonazepam 0.5 mg and Lamictal 150 mg to prevent breakthrough seizures.   No seizures from admission to CIR to 10/21 11. T2DM with hyperglycemia: Monitor BS ac/hs. Continue CBG (last 3)  Recent Labs    06/28/19 1628 06/28/19 2102 06/29/19 0632  GLUCAP 135* 119* 97   Labile on 10/21 12.  ESRD: Continue HD MWF at the end of the day to help with tolerance of therapy.  13. HTN:  Monitor BP tid  Amlodipine increased to 7.5 daily, decreased to 2.5 on 10/ 18  Continue metoprolol bid.   Labile on 10/21 14.  Anemia of chronic disease:  Hemoglobin 8.8 on 10/19, labs with HD 15. H/o A fib/PFO: Monitor HR tid--on metoprolol. Was on ASA only. No coumadin due to recurrent GIB and was on ASA--d/c due to SDH. Vitals:   06/28/19 2004 06/29/19 0634  BP: (!) 151/55 111/67  Pulse: (!) 59 63  Resp: 18 16  Temp: 98.6 F (37 C) 97.6 F (36.4 C)  SpO2: 100% 100%   Heart rate relatively controlled on 10/21 16. H/o SS thalassemia/PE/DVT: IVC filter placed 09/2015.  17. 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 Multiple myeloma- was scheduled for autologous stem cell [facility] at [facility]- will need f/u after rehab d/c. Labs also need to be  monitored at least weekly for now. 19. B/L renal stones-Intermittent monitoring.  20. OSA on CPAP- CPAP as required for nighttime.  LOS: 15 days A FACE TO FACE EVALUATION WAS PERFORMED  Ankit Lorie Phenix 06/29/2019, 8:48 AM

## 2019-06-29 NOTE — Progress Notes (Signed)
Social Work Patient ID: Isaiah [name], male   DOB: March 15, 1966, 53 y.o.   MRN: [id number]  Met with pt and wife was here yesterday for education and prepare for discharge tomorrow. Aware of team conference and feels ready to go home. See wife in am for any last minute questions.

## 2019-06-29 NOTE — Progress Notes (Signed)
Speech Language Pathology Discharge Summary  Patient Details  Name: Isaiah [name] [name] MRN: [id number] Date of Birth: [birth year]  Today's Date: 06/29/2019 SLP Individual Time: 2423-5361 SLP Individual Time Calculation (min): 52 min   Skilled Therapeutic Interventions:  Pt was seen for skilled ST targeting cognitive goals. SLP facilitated session by administering Cognistat to assess pt's progress throughout this admission. Pt scored WFL on all subtest except for visual construction subtest (score of 0 indicative of severe impairment), and short term memory subtest (score of 9 indicative of mild impairment). Pt demonstrated greatly improved performance on basic problem solving, math calculations, and attention tasks in comparison to his initial evaluation results. Pt does continue to require Min-Mod A for safety and emergent awareness, and will benefit from further cognitive interventions in [place] at discharge. Pt was left in bed with alarm set and all needs within reach. Continue per current plan of care.     Patient has met 6 of 6 long term goals.  Patient to discharge at overall Min;Mod level.  Reasons goals not met: n/a   Clinical Impression/Discharge Summary:   Pt made functional gains and met 6 out of 6 long term goals this admission. Pt currently requires Min assist for basic cognitive tasks, Mod A for emergent and safety awareness. Pt is Supervision A for use of speech intelligibility strategies and Min A for higher level word-finding to convey abstract thoughts. Given pt's current deficits, he will require 24/7 supervision. Pt has demonstrated improved sustained attention, basic problem solving, recall with compensatory memory aids, speech intelligibility, and word-finding. Pt would continue benefit from skilled [place] services upon discharge to address remaining problem solving, safety awareness, and communication deficits. Pt education is complete at this time; no family was present for  education session during ST this week.    Care Partner:  Caregiver Able to Provide Assistance: Yes  Type of Caregiver Assistance: Cognitive  Recommendation:  Home Health SLP;24 hour supervision/assistance  Rationale for SLP Follow Up: Maximize functional communication;Maximize cognitive function and independence;Reduce caregiver burden   Equipment: none   Reasons for discharge: Discharged from hospital   Patient/Family Agrees with Progress Made and Goals Achieved: Yes    Arbutus Leas 06/29/2019, 9:47 AM

## 2019-06-29 NOTE — Progress Notes (Signed)
Occupational Therapy Session Note  Patient Details  Name: Isaiah [name] MRN: [id number] Date of Birth: 01-21-1966  Today's Date: 06/29/2019 OT Individual Time: 1105-1206 OT Individual Time Calculation (min): 61 min    Short Term Goals: Week 1:  OT Short Term Goal 1 (Week 1): STGs=LTGs secondary to short LOS Week 2:  OT Short Term Goal 1 (Week 2): Continue working on established supervision level goals set for long term..  Skilled Therapeutic Interventions/Progress Updates:    Pt received in recliner and agreeable to therapy. He was already dressed from earlier and reported how he had done with the other therapist. He declined bathing as he showered yesterday.  Pt donned shoes with S using shoe buttons.  Pt did work on ADL transfers to the bathroom 2x. On first trial pt ambulated from recliner to toilet with RW with min A as he was pushing walker too far forward and decreasing his base of support.  Cued pt to try a 2nd time to toilet with arms by his side with RW and he was able to ambulate in and out of bR to toilet with Supervision only.  He completed 3/3 toileting tasks with S only.    Worked on smooth stand to sits onto bed as his first trial he did not lean forward and needed min A to sit safely.  On 2 more trials with cues to push his hips back pt able to stand to sit with S only.  Worked on Secretary/administrator with Nurse, mental health with mod cues.  He continues to have limited R visual ROM and decreased tracking skills. Pt does not drive and states he has no difficulty with reading.    Pt transferred to bed to prepare for dialysis. Bed alarm set and all needs met.   Therapy Documentation Precautions:  Precautions Precautions: Fall Precaution Comments: R inattention Restrictions Weight Bearing Restrictions: No       Pain: Pain Assessment Pain Scale: 0-10 Pain Score: 0-No pain     Therapy/Group: Individual Therapy  [place] 06/29/2019, 12:44 PM

## 2019-06-29 NOTE — Progress Notes (Signed)
Physical Therapy Discharge Summary  Patient Details  Name: Isaiah [name] [facility] MRN: [id number] Date of Birth: 09/08/1966  Today's Date: 06/29/2019 PT Individual Time: 0900-1000 PT Individual Time Calculation (min): 60 min   Patient has met 10 of 10 long term goals due to improved activity tolerance, improved balance, improved postural control, increased strength, functional use of  right upper extremity, improved attention, improved awareness and improved coordination.  Patient to discharge at an ambulatory level Supervision.  Patient's care partner is independent to provide the necessary physical assistance at discharge.  All goals met  Recommendation:  Patient will benefit from ongoing skilled PT services in outpatient setting to continue to advance safe functional mobility, address ongoing impairments in ambulation, LE strength, use of RUE, balance/coordination, stair navigation, improved tolerance to activity, and minimize fall risk.  Equipment: No equipment provided  Reasons for discharge: treatment goals met  Patient/family agrees with progress made and goals achieved: Yes  Today's Interventions Received pt supine in bed, pt agreeable to PT and denied any pain during session. Session focused on functional/mobility, ambulation, stair navigation, RW safety education, LE strength, balance, and improved tolerance to activity. Pt performed bed mobility with supervision. While sitting EOB PT donned shoes mod A for time management purposes. Pt ambulated 32f to sink with RW supervision and brushed teeth standing at sink supervision. Pt ambulated 730fwith RW supervision. Pt performed car transfer with RW supervision but required verbal cueing for RW safety when getting into car. Pt ambulated 1031fn uneven surface (ramp) with RW supervision. Pt picked up cup from floor with RW and supervision. Pt ambulated 54f3f rehab apartment and transferred to/from couch and recliner with supervision using RW.  Pt required verbal cues for hand placement on chair when standing. Pt navigated 17 steps with bilateral rails supervision ascending with reciprocal pattern and descending with step to pattern. Pt performed TUG x 3 trials with RW and supervision with average score of 34.3 seconds. Pt required verbal cues for safety during test. Pt ambulated 150ft6fh RW back to room with supervision. Concluded session with pt sitting in recliner, needs within reach, and chair pad alarm on.   PT Discharge Precautions/Restrictions Precautions Precautions: Fall Precaution Comments: R inattention Restrictions Weight Bearing Restrictions: No Pain Pain Assessment Pain Scale: 0-10 Pain Score: 0-No pain Vision/Perception  Vision - Assessment Ocular Range of Motion: Restricted on the right Tracking/Visual Pursuits: Decreased smoothness of vertical tracking;Decreased smoothness of eye movement to RIGHT superior field;Unable to hold eye position out of midline Perception Perception: Impaired Inattention/Neglect: Does not attend to right visual field Praxis Praxis: Intact  Cognition Overall Cognitive Status: Impaired/Different from baseline Arousal/Alertness: Awake/alert Orientation Level: Oriented X4 Attention: Selective Focused Attention: Appears intact Sustained Attention: Appears intact Selective Attention: Impaired Selective Attention Impairment: Verbal basic;Functional basic Memory: Impaired Memory Impairment: Retrieval deficit;Decreased short term memory Decreased Short Term Memory: Verbal basic Awareness: Impaired Awareness Impairment: Emergent impairment Problem Solving: Impaired Problem Solving Impairment: Verbal basic;Functional basic Executive Function: OrganWriteraired Organizing Impairment: Verbal basic;Functional basic Safety/Judgment: Impaired Comments: deficits with safety awareness Sensation Sensation Light Touch: Appears Intact Hot/Cold: Appears  Intact Proprioception: Appears Intact Stereognosis: Appears Intact Coordination Gross Motor Movements are Fluid and Coordinated: No Fine Motor Movements are Fluid and Coordinated: No Coordination and Movement Description: Grossly uncoordinated, but better when using RW Finger Nose Finger Test: Dysmetria R>L Heel Shin Test: W[facility]  Motor Motor: Hemiplegia;Abnormal postural alignment and control Motor - Skilled Clinical Observations: Grossly decreased R side>L  Mobility Bed  Mobility Bed Mobility: Rolling Right;Rolling Left;Supine to Sit;Sit to Supine Rolling Right: Supervision/verbal cueing Rolling Left: Supervision/Verbal cueing Supine to Sit: Supervision/Verbal cueing Sit to Supine: Supervision/Verbal cueing Transfers Transfers: Sit to Stand;Stand to Sit;Stand Pivot Transfers Sit to Stand: Supervision/Verbal cueing(RW) Stand to Sit: Supervision/Verbal cueing(RW) Stand Pivot Transfers: Supervision/Verbal cueing(RW) Stand Pivot Transfer Details: Verbal cues for technique;Verbal cues for sequencing;Verbal cues for precautions/safety Stand Pivot Transfer Details (indicate cue type and reason): verbal cues for hand placement and RW safety Transfer (Assistive device): Rolling walker Locomotion  Gait Ambulation: Yes Gait Assistance: Supervision/Verbal cueing Gait Distance (Feet): 150 Feet Assistive device: Rolling walker Gait Assistance Details: Verbal cues for sequencing;Verbal cues for technique;Verbal cues for precautions/safety;Verbal cues for gait pattern Gait Assistance Details: R inattention, mild trunk lean, narrow BOS Gait Gait: Yes Gait Pattern: Impaired Gait Pattern: Decreased stance time - right;Decreased weight shift to right;Decreased step length - left;Decreased trunk rotation;Trunk flexed;Narrow base of support;Poor foot clearance - left Gait velocity: Decreased Stairs / Additional Locomotion Stairs: Yes Stairs Assistance: Contact Guard/Touching assist Stair  Management Technique: Two rails Number of Stairs: 17 Height of Stairs: 6 Ramp: Supervision/Verbal cueing(RW) Curb: Contact Guard/Touching assist(RW) Wheelchair Mobility Wheelchair Mobility: No  Trunk/Postural Assessment  Cervical Assessment Cervical Assessment: Within Functional Limits Thoracic Assessment Thoracic Assessment: Exceptions to WFL(minor kyphosis) Lumbar Assessment Lumbar Assessment: Within Functional Limits Postural Control Postural Control: Deficits on evaluation  Balance Balance Balance Assessed: Yes Standardized Balance Assessment Standardized Balance Assessment: Timed Up and Go Test Timed Up and Go Test TUG: Normal TUG Normal TUG (seconds): 34.3(Trial 1 47.5; Trial 2 29; Trial 3 26.5 using RW) Static Sitting Balance Static Sitting - Level of Assistance: 7: Independent Dynamic Sitting Balance Dynamic Sitting - Level of Assistance: 5: Stand by assistance Static Standing Balance Static Standing - Level of Assistance: 5: Stand by assistance Dynamic Standing Balance Dynamic Standing - Level of Assistance: 4: Min assist Extremity Assessment  RUE Assessment Passive Range of Motion (PROM) Comments: WFLs (decreased sh flexion to 120) Active Range of Motion (AROM) Comments: WFLs ( decreased sh flex to 120) General Strength Comments: 4-/5 LUE Assessment LUE Assessment: Within Functional Limits RLE Assessment RLE Assessment: Exceptions to [facility] General Strength Comments: Grossly generalized to 4-/5 LLE Assessment LLE Assessment: Exceptions to [facility] General Strength Comments: Grossly generalized to 4/5   Isaiah [name] M Isaiah [name] Isaiah [name] PT, DPT   06/29/2019, 1:21 PM

## 2019-06-29 NOTE — Procedures (Signed)
   I was present at this dialysis session, have reviewed the session itself and made  appropriate changes Kelly Splinter MD [place] pager [phone]   06/29/2019, 2:45 PM

## 2019-06-29 NOTE — Patient Care Conference (Signed)
Inpatient RehabilitationTeam Conference and Plan of Care Update Date: 06/29/2019   Time: 11:00 AM    Patient Name: Isaiah [name] [facility]      Medical Record Number: [id number]  Date of Birth: 1966/01/11 Sex: Male         Room/Bed: 4M04C/4M04C-01 Payor Info: Payor: MEDICARE / Plan: MEDICARE PART A AND B / Product Type: *No Product type* /    Admit Date/Time:  06/14/2019  4:57 PM  Primary Diagnosis:  SDH (subdural hematoma) ([place])  Patient Active Problem List   Diagnosis Date Noted  . PAF (paroxysmal atrial fibrillation) ([place])   . Bacteremia   . Labile blood pressure   . Labile blood glucose   . Controlled type 2 diabetes mellitus with hyperglycemia, without long-term current use of insulin ([place])   . ESRD on dialysis ([place])   . Right sided weakness   . SDH (subdural hematoma) ([place])   . Acute on chronic intracranial subdural hematoma ([facility]) 06/09/2019  . Multiple myeloma not having achieved remission ([place]) 06/09/2019  . ESRD on hemodialysis ([place]) 06/09/2019  . Essential hypertension 06/09/2019  . DM type 2 (diabetes mellitus, type 2) ([place]) 06/09/2019  . Seizure disorder ([place]) 06/09/2019  . Aspiration pneumonia ([place]) 06/09/2019  . History of bacteremia 06/09/2019  . Atrial fibrillation, chronic ([place]) 06/09/2019  . Multiple myeloma ([place]) 05/17/2018  . Goals of care, counseling/discussion 05/17/2018  . Partial idiopathic epilepsy with seizures of localized onset, intractable, without status epilepticus ([place]) 11/02/2017  . Iron deficiency anemia, unspecified 01/22/2017  . Hemiparesis affecting right side as late effect of cerebrovascular accident ([place]) 02/28/2016  . Aphasia complicating stroke 78/29/5621  . Seizures ([place]) 01/08/2016  . History of ischemic left MCA stroke 01/08/2016  . Right sided weakness 01/08/2016  . Atrial fibrillation ([place]) 01/08/2016  . Leukocytosis 01/08/2016  . Benign essential HTN 01/08/2016  . Anemia of chronic disease 01/08/2016  . Controlled type 2 diabetes  mellitus with diabetic nephropathy, without long-term current use of insulin ([place]) 01/08/2016  . History of DVT (deep vein thrombosis) 01/08/2016  . ESRD (end stage renal disease) on dialysis [facility])     Expected Discharge Date: Expected Discharge Date: 06/30/19  Team Members Present: Physician leading conference: Dr. Delice Lesch Social Worker Present: Ovidio Kin, LCSW Nurse Present: Genene Churn, RN PT Present: Michaelene Song, PT OT Present: Clyda Greener, OT SLP Present: Charolett Bumpers, SLP PPS Coordinator present : Gunnar Fusi, SLP     Current Status/Progress Goal Weekly Team Focus  Bowel/Bladder   patient is ESRD-anuric, LBM 06/28/19, has prn medication Dulcolax suppository, senna and Enemeez enema  Maintain continence, and fluid restrictions as ordered  QS Assess toileting needs Continue Dialysis M-W-F , provided scheduled and prn medication for Bowels address concerns with f/u, Monitor I/O restriction closely   Swallow/Nutrition/ Hydration             ADL's   Min guard for transfers to the walk-in shower and the toilet with use of the RW.  Supervsion for UB selfcare with min guard assist for LB selfcare.  He still demonstrates decreased safety awareness as well  min assist  selfcare retraining, transfer training, cognitive retraining, pt/family education, therapeutic activities, neuromuscular re-education   Mobility   supervision bed mobility, CGA transfers using RW, CGA gait up to 163f with RW; however, pt continues to demonstrate intermittent anterior LOB requiring max assist to recover  supervision overall with CGA for stairs  standing balance, activity tolerance, gait, stair navigation, pt/family education/training, NMR, discharge planning  Communication   Min-Supervision A  Min-Supervision A  at goal level, finish education   Safety/Cognition/ Behavioral Observations  Min A basic tasks, Supervision A attention  Mod-Supervision A  at goal level, finish education  emphasizing impact of intellectual and emergent awareness on safety   Pain   Denies pain, continue to monitor and assess  Pain < - 2  QS assessment addressing concerns of pain,   Skin   Skin warm, dry and intact, no skin breakage  Maintain skin integrity  QS assessmant and prn, address new areas of concerns    Rehab Goals Patient on target to meet rehab goals: Yes Rehab Goals Revised: Family education with wife yesterday *See Care Plan and progress notes for long and short-term goals.     Barriers to Discharge  Current Status/Progress Possible Resolutions Date Resolved   Nursing                  PT                    OT                  SLP                SW                Discharge Planning/Teaching Needs:  Wife was here for educaiton yesterday and is aware of pt's need for 24 hr supervision at DC. She works from home and can provide this level      Team Discussion: On antibiotics.  Pain controlled, continent, educated wife on DM.  OT minguard transfers, anterior lean, at goal level.  PT CGA/S walker, at goal level.  SLP S communication, safety awareness issues.   Revisions to Treatment Plan:  N/A    Medical Summary Current Status: Impaired ADLs/mobility and function secondary to acute on chronic SDH with 5 mm of midline shift to left- with R hemiparesis and dysarthria- f/u with NSU after rehab; no head CT unless has a change in function, per NSU Weekly Focus/Goal: Improve bactermia, hyponatremia, BP/DM  Barriers to Discharge: Medical stability;Other (comments)  Barriers to Discharge Comments: Bacteremia Possible Resolutions to Barriers: Therapies, optimize DM/HTN meds, abx for bacteremia   Continued Need for Acute Rehabilitation Level of Care: The patient requires daily medical management by a physician with specialized training in physical medicine and rehabilitation for the following reasons: Direction of a multidisciplinary physical rehabilitation program to maximize  functional independence : Yes Medical management of patient stability for increased activity during participation in an intensive rehabilitation regime.: Yes Analysis of laboratory values and/or radiology reports with any subsequent need for medication adjustment and/or medical intervention. : Yes   I attest that I was present, lead the team conference, and concur with the assessment and plan of the team.   Retta Diones 06/29/2019, 8:00 PM

## 2019-06-30 ENCOUNTER — Ambulatory Visit: Payer: Medicare Other | Admitting: Neurology

## 2019-06-30 DIAGNOSIS — S065X9A Traumatic subdural hemorrhage with loss of consciousness of unspecified duration, initial encounter: Secondary | ICD-10-CM | POA: Diagnosis not present

## 2019-06-30 LAB — GLUCOSE, CAPILLARY: Glucose-Capillary: 112 mg/dL — ABNORMAL HIGH (ref 70–99)

## 2019-06-30 MED ORDER — CAMPHOR-MENTHOL 0.5-0.5 % EX LOTN: 1.0000 "application " | TOPICAL_LOTION | Freq: Three times a day (TID) | CUTANEOUS | 0 refills | Status: DC | PRN

## 2019-06-30 MED ORDER — HYDROXYZINE HCL 25 MG PO TABS: 25.0000 mg | ORAL_TABLET | Freq: Three times a day (TID) | ORAL | 0 refills | Status: DC | PRN

## 2019-06-30 MED ORDER — AMLODIPINE BESYLATE 2.5 MG PO TABS: 2.5000 mg | ORAL_TABLET | Freq: Every day | ORAL | 0 refills | Status: DC

## 2019-06-30 MED FILL — hydrOXYzine HCL 25 MG TABS: 25 | 10 days supply | Qty: 30 | Fill #0

## 2019-06-30 MED FILL — CIPROFLOXACIN HCL 500 MG TA: 500 | 30 days supply | Qty: 30 | Fill #1

## 2019-06-30 MED FILL — AMLODIPINE BESYLATE 5 MG TA: 5 | 30 days supply | Qty: 30 | Fill #1

## 2019-06-30 MED FILL — AMLODIPINE 2.5 MG TABLET: 2.5 | 30 days supply | Qty: 30 | Fill #0

## 2019-06-30 MED FILL — SARNA 0.5-0.5 % LOTN: 0.5-0.5 | 30 days supply | Qty: 222 | Fill #0

## 2019-06-30 NOTE — Progress Notes (Signed)
Patient anticipated discharge home today, no complication post dialysis

## 2019-06-30 NOTE — Discharge Instructions (Signed)
Inpatient Rehab Discharge Instructions  [place] Discharge date and time:  06/30/19  Activities/Precautions/ Functional Status: Activity: no lifting, driving, or strenuous exercise for till cleared by MD Diet: diabetic diet and renal diet --Limit fluids to 5 cups/day Wound Care: none needed   Functional status:  ___ No restrictions     ___ Walk up steps independently _X__ 24/7 supervision/assistance   ___ Walk up steps with assistance ___ Intermittent supervision/assistance  ___ Bathe/dress independently ___ Walk with walker     ___ Bathe/dress with assistance ___ Walk Independently    ___ Shower independently _X__ Walk with assistance    _X__ Shower with assistance _X__ No alcohol     ___ Return to work/school ________   Special Instructions: 1. Monitor blood sugars 2-3 times a day before meals and record.    COMMUNITY REFERRALS UPON DISCHARGE:    Home Health:   PT  OT  SPT  [place]   Date of last service:06/30/2019  Medical Equipment/Items Ordered:TUB SEAT  Agency/Supplier:[facility]   [phone]  My questions have been answered and I understand these instructions. I will adhere to these goals and the provided educational materials after my discharge from the hospital.  Patient/Caregiver Signature _______________________________ Date __________  Clinician Signature _______________________________________ Date __________  Please bring this form and your medication list with you to all your follow-up doctor's appointments.

## 2019-06-30 NOTE — Progress Notes (Signed)
[place] PHYSICAL MEDICINE & REHABILITATION PROGRESS NOTE   Subjective/Complaints: Patient seen laying in bed this morning.  He states he slept well overnight.  He is ready for discharge today.  ROS: Denies CP, SOB, N/V/D  Objective: No results found. Recent Labs    06/27/19 1349 06/29/19 1337  WBC 6.8 6.0  HGB 8.8* 9.1*  HCT RESULTS UNAVAILABLE DUE TO INTERFERING SUBSTANCE RESULTS UNAVAILABLE DUE TO INTERFERING SUBSTANCE  PLT 217 274   Recent Labs    06/27/19 1349 06/29/19 1338  NA 134* 134*  K 3.9 3.7  CL 95* 94*  CO2 25 25  GLUCOSE 133* 125*  BUN 50* 37*  CREATININE 11.36* 10.19*  CALCIUM 7.5* 7.5*    Intake/Output Summary (Last 24 hours) at 06/30/2019 0846 Last data filed at 06/29/2019 1727 Gross per 24 hour  Intake -  Output 2500 ml  Net -2500 ml     Physical Exam: Vital Signs Blood pressure 104/66, pulse 68, temperature 98.1 F (36.7 C), temperature source Oral, resp. rate 18, height 6' (1.829 m), weight 79.6 kg, SpO2 100 %. Constitutional: No distress . Vital signs reviewed. HENT: Normocephalic.  Atraumatic. Eyes: EOMI. No discharge. Cardiovascular: No JVD. Respiratory: Normal effort.  No stridor. GI: Non-distended. Skin: Warm and dry.  Intact. Psych: Normal mood.  Normal behavior. Musc: No edema in extremities.  No tenderness in extremities. Neurologic: Alert Dysarthria Facial weakness Motor: LUE/LLE: 5/5 proximal distal RUE: 4+/5 proximal to distal, stable RLE: 4+/5 proximal to distal  Assessment/Plan: 1. Functional deficits secondary to SDH which require 3+ hours per day of interdisciplinary therapy in a comprehensive inpatient rehab setting.  Physiatrist is providing close team supervision and 24 hour management of active medical problems listed below.  Physiatrist and rehab team continue to assess barriers to discharge/monitor patient progress toward functional and medical goals  Care Tool:  Bathing    Body parts bathed by  patient: Right arm, Left arm, Chest, Abdomen, Face, Left upper leg, Front perineal area, Right upper leg, Right lower leg, Left lower leg, Buttocks   Body parts bathed by helper: Buttocks Body parts n/a: Left lower leg, Right lower leg(did not attempt washing feet)   Bathing assist Assist Level: Contact Guard/Touching assist     Upper Body Dressing/Undressing Upper body dressing Upper body dressing/undressing activity did not occur (including orthotics): N/A What is the patient wearing?: Pull over shirt    Upper body assist Assist Level: Supervision/Verbal cueing    Lower Body Dressing/Undressing Lower body dressing      What is the patient wearing?: Underwear/pull up, Pants, Incontinence brief     Lower body assist Assist for lower body dressing: Contact Guard/Touching assist     Toileting Toileting    Toileting assist Assist for toileting: Supervision/Verbal cueing     Transfers Chair/bed transfer  Transfers assist     Chair/bed transfer assist level: Supervision/Verbal cueing     Locomotion Ambulation   Ambulation assist      Assist level: Supervision/Verbal cueing Assistive device: Walker-rolling Max distance: 168f   Walk 10 feet activity   Assist     Assist level: Supervision/Verbal cueing Assistive device: Walker-rolling   Walk 50 feet activity   Assist    Assist level: Supervision/Verbal cueing Assistive device: Walker-rolling    Walk 150 feet activity   Assist Walk 150 feet activity did not occur: Safety/medical concerns(High fatigue level)  Assist level: Supervision/Verbal cueing Assistive device: Walker-rolling    Walk 10 feet on uneven surface  activity  Assist     Assist level: Supervision/Verbal cueing Assistive device: Aeronautical engineer Will patient use wheelchair at discharge?: No             Wheelchair 50 feet with 2 turns activity    Assist            Wheelchair 150  feet activity     Assist          Blood pressure 104/66, pulse 68, temperature 98.1 F (36.7 C), temperature source Oral, resp. rate 18, height 6' (1.829 m), weight 79.6 kg, SpO2 100 %.    Medical Problem List and Plan: 1.  Impaired ADLs/mobility and function secondary to acute on chronic SDH with 5 mm of midline shift to left- with R hemiparesis and dysarthria- f/u with [facility] after rehab; no head CT unless has a change in function, per [facility]  DC today  Will see patient for transitional care management in 1-2 weeks post-discharge  Repeat head CT on 10/12 reviewed-stable.  Will hold off on repeat MRI at present given what appeared to be fluctuations secondary to nonacute events.  Will consider repeat MRI if necessary, not needed at this time. 2.  Antithrombotics: -DVT/anticoagulation:  Mechanical: Sequential compression devices, below knee Bilateral lower extremities and IVC filter             -antiplatelet therapy: N/A due to SDH 3. Pain Management:  Tylenol prn.  4. Mood: LCSW to follow for evaluation and support.              -antipsychotic agents: N/A 5. Neuropsych: This patient is not fully capable of making decisions on his own behalf. 6. Skin/Wound Care: Routine pressure relief measures.  7. Fluids/Electrolytes/Nutrition: Strict I/O. Labs per HD protocol.   9. Proteus bacteremia/Recent aspiration PNA: Treated with Zosyn and transitioned to cipro on 9/24 for 120 days 10. Seizure disorder: On Vimpat and Lamictal bid.  Post HD on MWF needs additional doses of clonazepam 0.5 mg and Lamictal 150 mg to prevent breakthrough seizures.   No seizures from admission to CIR to 10/22 11. T2DM with hyperglycemia: Monitor BS ac/hs. Continue CBG (last 3)  Recent Labs    06/29/19 1140 06/29/19 2048 06/30/19 0635  GLUCAP 131* 88 112*   Slightly labile on 10/22 12.  ESRD: Continue HD MWF at the end of the day to help with tolerance of therapy.  13. HTN: Monitor BP tid  Amlodipine increased  to 7.5 daily, decreased to 2.5 on 10/ 18  Continue metoprolol bid.   Relatively controlled on 10/22 14.  Anemia of chronic disease:  Hemoglobin 9.1 on 10/21, labs with HD 15. H/o A fib/PFO: Monitor HR tid--on metoprolol. Was on ASA only. No coumadin due to recurrent GIB and was on ASA--d/c due to SDH. Vitals:   06/29/19 2049 06/30/19 0649  BP: (!) 114/57 104/66  Pulse: 60 68  Resp: 17 18  Temp: 98.6 F (37 C) 98.1 F (36.7 C)  SpO2:  100%   Heart rate relatively controlled on 10/21 16. H/o SS thalassemia/PE/DVT: IVC filter placed 09/2015.  17. 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 Multiple myeloma- was scheduled for autologous stem cell transplant at [facility]- will need f/u after rehab d/c. Labs also need to be monitored at least weekly for now. 19. B/L renal stones-Intermittent monitoring.  20. OSA on CPAP- CPAP  as required for nighttime.  LOS: 16 days A FACE TO FACE EVALUATION WAS PERFORMED  Isaiah [name] Lorie Phenix 06/30/2019, 8:46 AM

## 2019-06-30 NOTE — Progress Notes (Signed)
Social Work Discharge Note   The overall goal for the admission was met for:   Discharge location: Yes-HOME WITH WIFE AND SON-24 HR SUPERVISION  Length of Stay: Yes-16 DAYS  Discharge activity level: Yes-SUPERVISION-CGA LEVEL  Home/community participation: Yes  Services provided included: MD, RD, PT, OT, SLP, RN, CM, Pharmacy, Neuropsych and SW  Financial Services: Medicare and Private Insurance: [employer]  Follow-up services arranged: Home Health: [facility]-PT,OT,SP, DME: [facility] and Patient/Family request agency HH: [facility], DME: NO PREF  Comments (or additional information):WIFE WAS HERE TO GO THROUGH TRAINING AND IT WENT WELL, AWARE OF PT'S NEED FOR 24 HR CARE AT DC.  Patient/Family verbalized understanding of follow-up arrangements: Yes  Individual responsible for coordination of the follow-up plan: SYLVIA-WIFE  Confirmed correct DME delivered: Elease Hashimoto 06/30/2019    Elease Hashimoto

## 2019-06-30 NOTE — Progress Notes (Signed)
Pt discharged to home with personal property. Discharge instructions provided to pt and wife by P.Love, PA. Pt verbalized an understanding of medications and follow up appts.

## 2019-06-30 NOTE — Plan of Care (Signed)
Pt to discharge to home

## 2019-06-30 NOTE — Discharge Summary (Addendum)
Physician Discharge Summary  Patient ID: [place] MRN: [id number] DOB/AGE: October 26, 1965 53 y.o.  Admit date: 06/14/2019 Discharge date: 06/30/2019  Discharge Diagnoses:  Principal Problem:   SDH (subdural hematoma) (HCC) Active Problems:   Labile blood glucose   Controlled type 2 diabetes mellitus with hyperglycemia, without long-term current use of insulin ([place])   ESRD on dialysis ([place])   Labile blood pressure   PAF (paroxysmal atrial fibrillation) ([place])   Bacteremia   Discharged Condition: stable  Significant Diagnostic Studies: Ct Head Wo Contrast  Result Date: 06/20/2019 CLINICAL DATA:  Neuro deficit(s), subacute. Additional history: Patient was working with therapy and had sudden decrease in mobility, minimal assist this morning, now max assist, patient reports slight blurred vision as well as nausea earlier. EXAM: CT HEAD WITHOUT CONTRAST TECHNIQUE: Contiguous axial images were obtained from the base of the skull through the vertex without intravenous contrast. COMPARISON:  Head CT 06/12/2019, brain MRI 01/07/2016 FINDINGS: Brain: Redemonstrated right subdural collection, greatest overlying the frontoparietal lobes, and measuring up to 1.5 cm in thickness (series 5, image 37). As before, the collection is predominantly intermediate density. However, there are subtle persistent internal foci of hyperdensity which may reflect small components of acute/subacute blood products and/or septations. There is mass effect upon the underlying right cerebral hemisphere. Partial effacement of the right lateral ventricle. 4 mm leftward midline shift. Again demonstrated are chronic infarcts within the left frontal lobe, right frontal and parietal lobes, right temporal occipital lobe, and bilateral cerebellar hemispheres. No new infarct is definitively identified. Ill-defined hypoattenuation of the cerebral white matter is nonspecific, but consistent with chronic small vessel ischemic disease. No  evidence of intracranial mass. Mild generalized parenchymal atrophy. Partially empty sella turcica. Vascular: No hyperdense vessel. Atherosclerotic calcification of the carotid artery siphons and vertebrobasilar system. Skull: No calvarial fracture Sinuses/Orbits: Visualized orbits demonstrate no acute abnormality. Mild scattered paranasal sinus mucosal thickening. Moderate-sized left maxillary sinus mucous retention cyst. No significant mastoid effusion. These results were called by telephone at the time of interpretation on 06/20/2019 at 3:26 pm to provider [facility] LOVE , who verbally acknowledged these results. IMPRESSION: 1. Mixed density right subdural hematoma not significantly changed as compared to head CT 06/12/2019. Stable mass effect on the right cerebral hemisphere with 4 mm leftward midline shift. 2. Multiple redemonstrated chronic infarcts as detailed. No new infarct is identified, although extensive chronic changes somewhat limit evaluation. Consider brain MRI for further evaluation, if clinically warranted. 3. Generalized atrophy with chronic small vessel ischemic disease. Electronically Signed   By: Kellie Simmering   On: 06/20/2019 15:26   Ct Head Wo Contrast  Result Date: 06/12/2019 CLINICAL DATA:  53 year old male with multiple myeloma. Right side subdural hematoma. EXAM: CT HEAD WITHOUT CONTRAST TECHNIQUE: Contiguous axial images were obtained from the base of the skull through the vertex without intravenous contrast. COMPARISON:  Brain MRI and head CT 06/09/2019. FINDINGS: Brain: Mixed density right side subdural hematoma measures up to 14-15 millimeters in thickness and appears unchanged since 06/09/2019, including several curvilinear areas of hyperdense blood within the background of hypodense subdural. Smaller more hyperdense right tentorial and right occipital pole component is also stable. Leftward midline shift of 3-4 millimeters is stable. Basilar cisterns remain patent. No  ventriculomegaly no new intracranial hemorrhage identified. Chronic encephalomalacia in the left MCA, right PCA, and left cerebellar artery territories. Stable gray-white matter differentiation throughout the brain. No cortically based acute infarct identified. Vascular: Calcified atherosclerosis at the skull base. No suspicious intracranial vascular hyperdensity.  Skull: No acute osseous abnormality identified. Sinuses/Orbits: Left maxillary sinus mucous retention cyst. Other Visualized paranasal sinuses and mastoids are stable and well pneumatized. Other: No acute orbit or scalp soft tissue findings. Left occipital region scalp sebaceous cyst. IMPRESSION: 1. Stable mixed density right side subdural hematoma since 06/09/2019. Stable intracranial mass effect with leftward midline shift of 3-4 mm. 2. No new intracranial abnormality. 3. Chronic ischemia in the left MCA, right PCA, and left cerebellar artery territories. Electronically Signed   By: Genevie Ann M.D.   On: 06/12/2019 09:54   Mr Brain Wo Contrast  Result Date: 06/09/2019 CLINICAL DATA:  Multiple myeloma, sepsis, diabetes. Subdural hemorrhage on CT today EXAM: MRI HEAD WITHOUT CONTRAST TECHNIQUE: Multiplanar, multiecho pulse sequences of the brain and surrounding structures were obtained without intravenous contrast. COMPARISON:  CT head 06/09/2019 FINDINGS: Brain: Subdural hematoma on the right has complex signal characteristics compatible with subacute to chronic blood. Based on CT, there may be a small amount of more recent hemorrhage posteriorly. There are multiple septations within the fluid. The subdural fluid measures approximately 14 mm in thickness in the parietal lobe and approximately 9 mm in thickness in the frontal lobe. There is mass-effect on the right hemisphere with 5 mm midline shift to the left. Ventricle size normal.  Negative for acute infarct Chronic hemorrhagic infarct left posterior frontal lobe. Chronic hemorrhagic infarct right  posterior temporal/occipital lobe. Moderate atrophy. Vascular: Normal arterial flow voids Skull and upper cervical spine: No focal bony lesion. Sinuses/Orbits: Mucosal edema paranasal sinuses. Cyst left maxillary sinus. Negative orbit. Other: None IMPRESSION: Right-sided subdural hematoma appears predominantly subacute although there may be an area of more recent hemorrhage posteriorly based on high density on CT. Fluid measures up to 14 mm in thickness in the parietal lobe and 9 mm in thickness in the frontal lobe. 5 mm midline shift to the left. Negative for acute infarct Generalized atrophy with chronic ischemia. Electronically Signed   By: Franchot Gallo M.D.   On: 06/09/2019 16:58   Dg Chest Port 1 View  Result Date: 06/09/2019 CLINICAL DATA:  Altered mental status EXAM: PORTABLE CHEST 1 VIEW COMPARISON:  01/06/2016 FINDINGS: The heart size and mediastinal contours are within normal limits. Both lungs are clear. The visualized skeletal structures are unremarkable. IMPRESSION: No acute cardiopulmonary findings. Electronically Signed   By: Davina Poke M.D.   On: 06/09/2019 10:50   Ct Head Code Stroke Wo Contrast  Result Date: 06/09/2019 CLINICAL DATA:  Code stroke. Focal neuro deficit, less than 6 hours, stroke suspected. Additional history provided: Last known well 0 800, right-sided weakness, slurred speech, right facial droop. EXAM: CT HEAD WITHOUT CONTRAST TECHNIQUE: Contiguous axial images were obtained from the base of the skull through the vertex without intravenous contrast. COMPARISON:  Brain MRI 01/07/2016 FINDINGS: Brain: There is a mixed density holohemispheric subdural collection on the right containing internal foci of hyperdense acute/subacute blood products. This measures up to 1.5 cm in greatest thickness. There is mass effect upon the underlying right cerebral hemisphere. 4 mm leftward midline shift. No new cortical infarct is identified. Multiple redemonstrated chronic infarcts as  follows. Moderate-sized chronic infarct within the left frontal lobe. Small chronic infarct within the right frontal lobe. Small chronic infarct within the right temporal occipital lobe. Chronic bilateral cerebellar lacunar infarcts. Vascular: No hyperdense vessel. Atherosclerotic calcification of the carotid artery siphons and vertebrobasilar system. Skull: No calvarial fracture. Sinuses/Orbits: Visualized orbits demonstrate no acute abnormality. Partially imaged mucous retention cyst within the inferior  left maxillary sinus. Mild ethmoid sinus mucosal thickening. No significant mastoid effusion. Left suboccipital scalp sebaceous cyst. These results were called by telephone at the time of interpretation on 06/09/2019 at 10:30 am to provider Dr. Leonel Ramsay, who verbally acknowledged these results. ASPECTS [facility] Stroke Program Early CT Score) - Ganglionic level infarction (caudate, lentiform nuclei, internal capsule, insula, M1-M3 cortex): 7 - Supraganglionic infarction (M4-M6 cortex): 3 Total score (0-10 with 10 being normal): 10 (when discounting chronic infarcts) IMPRESSION: 1. Mixed density right holohemispheric subdural collection containing internal foci of acute/subacute blood products, measuring up to 1.5 cm in thickness. Mass effect upon the underlying right cerebral hemisphere. 4 mm leftward midline shift. 2. No new cortical infarct is identified. Redemonstrated chronic infarcts within the left frontal lobe, right frontal lobe, right temporal occipital lobes and bilateral cerebellar hemispheres as described. Electronically Signed   By: Kellie Simmering   On: 06/09/2019 10:42    Labs:  Basic Metabolic Panel: Recent Labs  Lab 06/27/19 1349 06/29/19 1338  NA 134* 134*  K 3.9 3.7  CL 95* 94*  CO2 25 25  GLUCOSE 133* 125*  BUN 50* 37*  CREATININE 11.36* 10.19*  CALCIUM 7.5* 7.5*  PHOS 2.4* 3.2    CBC: Recent Labs  Lab 06/27/19 1349 06/29/19 1337  WBC 6.8 6.0  NEUTROABS  --  4.0  HGB  8.8* 9.1*  HCT RESULTS UNAVAILABLE DUE TO INTERFERING SUBSTANCE RESULTS UNAVAILABLE DUE TO INTERFERING SUBSTANCE  MCV RESULTS UNAVAILABLE DUE TO INTERFERING SUBSTANCE RESULTS UNAVAILABLE DUE TO INTERFERING SUBSTANCE  PLT 217 274    CBG: Recent Labs  Lab 06/28/19 2102 06/29/19 0632 06/29/19 1140 06/29/19 2048 06/30/19 0635  GLUCAP 119* 97 131* 88 112*    Brief HPI:   [place] is a 53 y.o. male with history of T2DM, A fib, ESRD-HD MWF, CVA, DVT/PE s/p IVC filter, seizure disorder, MM with recent admission to [facility] for stem cell transplant but procedure aborted due to findings of proteus mirabilis bacteremia, aspiration PNA, acute on chronic SDH, findings of bilateral renal calculi as well as breakthrough seizures. He was discharged to home 06/07/19 on Cipro but continued to have progressive weakness with poor po intake developed slurred speech with increased in right sided weakness and inability to walk. He was admitted on 06/09/19 with work up and CT head revealed 1.5 cn acute on chronic SDH with 53m midline shift. MRI brain showed acute on chronic SDH without acute infarct.  Dr. EEllene Routerecommended conservative care with monitoring for neurological changes. Dr. KLeonel Ramsayfelt that mild worsening of right sided weakness was due to physiological stress of N/V with poor intake. Blood cultures X 2 was negative. Follow up CT head 10/4 was stable and mentation as well as activity tolerance was improving. Therapy initiated and CIR recommended due to functional decline.    Hospital Course: [place]hab 06[place]/2019 for inpatient therapies to consist of PT, ST and OT at least three hours five days a week. Past admission physiatrist, therapy team and rehab RN have worked together to provide customized collaborative inpatient rehab.  He has been seizure free on home regimen. No complaints of HA or pain reported during his stay. He has been afebrile and continues on Cipro for  treatment of proteus bacteremia. HD has been ongoing on MWF and anemia of chronic disease is relatively stable.  Blood pressures were monitored on TID basis and amlodipine was decreased to 2.5 mg daily to prevent hypotension.  Heart rate  has been controlled on metoprolol.. Diabetes has been monitored with ac/hs CBG checks and SSI prn has been effective in BS control. He is to resume home SSI scale after discharge.  Po intake has been good and he is continent of bowel and bladder.  He was noted to have balance deficits intermittently felt to be due to fatigue as follow up CT head showed no significant change in bleed. He has been making steady progress during his rehab stay and is currently at supervision to [place] level. He  will continue to receive follow up [place], [place] and [facility]ST by [facility] after discharge.   Rehab course: During patient's stay in rehab weekly team conferences were held to monitor patient's progress, set goals and discuss barriers to discharge. At admission, patient required mod assist with mobility and min assist for ADL tasks. He exhibited severe to mild cognitive deficits with mild dysarthria. He has had improvement in activity tolerance, balance, postural control as well as ability to compensate for deficits.  He is able to complete ADL tasks with min assist and requires supervision for mobility. He is able to ambulate 150' with RW and supervision. He requires supervision to utilize speech intelligibility, min assist for higher level expression, min assist for basic cognitive tasks, mod assist for emergent and safety awareness. Family education was completed with wife who will assist as needed after discharge.    Disposition:  Home  Diet: Renal diet/1200 cc FR.   Special Instructions: 1. Needs 24 hours supervision and assistance with medication management.  2. Monitor BS ac/hs and use SSI per home protocol.  3. Do not use ASA or any ASA containing products.    Discharge  Instructions    Ambulatory referral to Physical Medicine Rehab   Complete by: As directed    1-2 weeks transitional care appt     Allergies as of 06/30/2019      Reactions   Codeine Rash, Other (See Comments)   Unknown reaction (patient says it was more serious than just a rash, but he can't remember what happened) Unknown reaction (patient says it was more serious than just a rash, but he can't remember what happened)   Codeine Swelling      Medication List    STOP taking these medications   aspirin EC 81 MG tablet   bortezomib SQ 3.5 MG Solr Commonly known as: VELCADE   calcium acetate 667 MG capsule Commonly known as: PHOSLO   cyclophosphamide 50 MG capsule Commonly known as: CYTOXAN   dexamethasone 4 MG tablet Commonly known as: DECADRON   insulin lispro 100 UNIT/ML injection Commonly known as: HUMALOG   midodrine 10 MG tablet Commonly known as: PROAMATINE     TAKE these medications   acetaminophen 325 MG tablet Commonly known as: TYLENOL Take 1-2 tablets (325-650 mg total) by mouth every 4 (four) hours as needed for mild pain.   acyclovir 400 MG tablet Commonly known as: ZOVIRAX Take 400 mg by mouth at bedtime. What changed: Another medication with the same name was removed. Continue taking this medication, and follow the directions you see here.   albuterol 108 (90 Base) MCG/ACT inhaler Commonly known as: ProAir HFA Inhale two puffs every 4-6 hours if needed for cough or wheeze   amLODipine 2.5 MG tablet Commonly known as: NORVASC Take 1 tablet (2.5 mg total) by mouth at bedtime. What changed:   medication strength  how much to take  when to take this   [facility]  200-25 MCG/INH Aepb Generic drug: fluticasone furoate-vilanterol Inhale 1 puff into the lungs daily. What changed: Another medication with the same name was removed. Continue taking this medication, and follow the directions you see here.   Breo Ellipta 200-25 MCG/INH Aepb Generic  drug: fluticasone furoate-vilanterol INHALE 1 PUFF BY MOUTH AT THE SAME TIME EVERY DAY What changed: Another medication with the same name was removed. Continue taking this medication, and follow the directions you see here.   calcium carbonate 500 MG chewable tablet Commonly known as: TUMS - dosed in mg elemental calcium Chew 2 tablets by mouth at bedtime.   camphor-menthol lotion Commonly known as: SARNA Apply 1 application topically every 8 (eight) hours as needed for itching.   Cetirizine HCl 10 MG Caps Take 1 capsule by mouth daily.   cinacalcet 60 MG tablet Commonly known as: SENSIPAR Take 60 mg by mouth every evening. What changed: Another medication with the same name was removed. Continue taking this medication, and follow the directions you see here.   ciprofloxacin 500 MG tablet Commonly known as: CIPRO Take 500 mg by mouth every evening. For 120 days   clonazePAM 0.5 MG tablet Commonly known as: KLONOPIN Take 0.5 mg by mouth every Monday, Wednesday, and Friday with hemodialysis.   clonazePAM 0.5 MG tablet Commonly known as: KLONOPIN TAKE 1 TABLET BY MOUTH PRIOR TO EACH DIALYSIS SESSION   hydrOXYzine 25 MG tablet Commonly known as: ATARAX/VISTARIL Take 1 tablet (25 mg total) by mouth every 8 (eight) hours as needed for itching.   lamoTRIgine 150 MG tablet Commonly known as: LAMICTAL Take 150 mg by mouth 2 (two) times daily. What changed: Another medication with the same name was removed. Continue taking this medication, and follow the directions you see here.   metoprolol tartrate 25 MG tablet Commonly known as: LOPRESSOR Take 12.5 mg by mouth 2 (two) times daily. What changed: Another medication with the same name was removed. Continue taking this medication, and follow the directions you see here.   multivitamin Tabs tablet Take 1 tablet by mouth at bedtime. What changed: Another medication with the same name was removed. Continue taking this medication,  and follow the directions you see here.   pantoprazole 40 MG tablet Commonly known as: PROTONIX Take 40 mg by mouth daily. What changed: Another medication with the same name was removed. Continue taking this medication, and follow the directions you see here.   prochlorperazine 10 MG tablet Commonly known as: COMPAZINE TAKE 1 TABLET (10 MG TOTAL) BY MOUTH EVERY 6 (SIX) HOURS AS NEEDED FOR NAUSEA OR VOMITING. What changed: Another medication with the same name was removed. Continue taking this medication, and follow the directions you see here.   Vimpat 150 MG Tabs Generic drug: Lacosamide Take 150 mg by mouth See admin instructions. Take 1 capsule (134m) by mouth twice daily all day except on Mon, Wed, Fri take 1 capsule in the morning, 1 capsule after dialysis, and 1 capsule at bedtime.   Vimpat 150 MG Tabs Generic drug: Lacosamide TAKE 1 TABLET BY MOUTH TWICE DAILY TAKE AN ADDITIONAL 1 TABLET AFTER DIALYSIS SESSION THREE TIMES A WEEK.      Follow-up Information    EKristeen Miss MD. Call.   Specialty: Neurosurgery Why: for follow up or can follow up at [place]: [address] [id number] Follow up.   Specialty: Physical Medicine and  Rehabilitation Why: Office will call you with follow up appointment Contact information: [address], [place]       [name], MD. Call.   Specialty: Internal Medicine Why: for post hospital follow up Contact information: [address] [place] [address] [phone]           Signed: Bary Leriche 06/30/2019, 11:22 AM Patient was seen, face-face, and physical exam performed by me on day of discharge, less than 30 minutes of total time spent.Delice Lesch, MD, ABPMR

## 2019-07-01 ENCOUNTER — Telehealth: Payer: Self-pay | Admitting: Nephrology

## 2019-07-01 ENCOUNTER — Other Ambulatory Visit: Payer: Self-pay | Admitting: *Deleted

## 2019-07-01 DIAGNOSIS — D509 Iron deficiency anemia, unspecified: Secondary | ICD-10-CM | POA: Diagnosis not present

## 2019-07-01 DIAGNOSIS — N186 End stage renal disease: Secondary | ICD-10-CM | POA: Diagnosis not present

## 2019-07-01 DIAGNOSIS — E1129 Type 2 diabetes mellitus with other diabetic kidney complication: Secondary | ICD-10-CM | POA: Diagnosis not present

## 2019-07-01 DIAGNOSIS — D631 Anemia in chronic kidney disease: Secondary | ICD-10-CM | POA: Diagnosis not present

## 2019-07-01 DIAGNOSIS — N2581 Secondary hyperparathyroidism of renal origin: Secondary | ICD-10-CM | POA: Diagnosis not present

## 2019-07-01 DIAGNOSIS — Z992 Dependence on renal dialysis: Secondary | ICD-10-CM | POA: Diagnosis not present

## 2019-07-01 NOTE — Telephone Encounter (Signed)
Transition of care contact from inpatient facility  Date of Discharge: 06/30/2019 Date of Contact: 07/01/2019 Method of contact: phone Talked to patient wife  Patient contacted to discuss transition of care form recent inpatient hospitalization.  Patient was admitted to [facility] from 10/1-10/6 and CIR from 10/6-10/22 with the diagnosis of AoC intracranial subdural hematoma, seizure disorder and debility.    Medication changes were reviewed.  Advised if continued to have low BP midodrine would likely need to be restarted.   Patient will follow up with outpatient dialysis center again on 07/04/2019.  Other follow up needs include: none.  Jen Mow, PA-C [facility] Pager: [phone]

## 2019-07-01 NOTE — Patient Outreach (Signed)
[facility]) Care Management  07/01/2019  [place] Jun 05, 1966 [id number]   Transition of care telephone call  Referral received: 06/14/19 Initial outreach: 07/01/19 Insurance: [employer]  Initial unsuccessful telephone call to patient's preferred (home/mobile) number in order to complete transition of care assessment; no answer, left HIPAA compliant voicemail message requesting return call.   Objective: Per the electronic medical record, [name] was hospitalized at [facility] from  10/1-10/6 for worsening neurological symptoms ( increased right sided weakness, slurred speech,  memory loss, and inability to walk) related to acute on chronic subdural hematoma. He was transferred to the acute rehabilitation unit on 10/6.   Comorbidities include: atrial fibrillation, HTN, chronic combined systolic and diastolic heart failure, history of ischemic left middle cerebral artery stroke, pulmonary emboli, aspiration pneumonia, asthma, OSA on CPAP, GI Bleed, Type 2 DM- most recent Hgb A1C= 4.8% on 06/14/19= ,  Right side hemiparesis, seizure disorder, ESRD- on hemodialysis M-W-F, anemia, gout, hx of DVT, multiple myeloma, sickle cell thalassemia He was discharged to home on 06/30/19 with round the clock care to be provided by wife and son and with orders for home health physical therapy, occupational therapy, speech therapy and DME of tub seat to be provided by [facility] agency.   Plan: This RNCM will route unsuccessful outreach letter with [place] Management pamphlet and 24 hour Nurse Advice Line Magnet to [place] [facility] clinical pool to be mailed to patient's home address. This RNCM will attempt another outreach within 4 business days.  Barrington Ellison RN,CCM,CDE [place] Management Coordinator Office Phone [phone] Office Fax [phone]

## 2019-07-01 NOTE — Telephone Encounter (Signed)
Patient's wife, [name], called to report the patient was in the hospital from 10/1-10/22/2020. He came home yesterday from the hospital and went to dialysis today.   She's now just received a call from the dialysis facility that they had to take him off the dialysis machine due to having a seizure. She wants Dr. Delice Lesch to be aware of what is happening.

## 2019-07-04 ENCOUNTER — Telehealth: Payer: Self-pay | Admitting: *Deleted

## 2019-07-04 ENCOUNTER — Telehealth: Payer: Self-pay | Admitting: Neurology

## 2019-07-04 DIAGNOSIS — D509 Iron deficiency anemia, unspecified: Secondary | ICD-10-CM | POA: Diagnosis not present

## 2019-07-04 DIAGNOSIS — Z992 Dependence on renal dialysis: Secondary | ICD-10-CM | POA: Diagnosis not present

## 2019-07-04 DIAGNOSIS — D631 Anemia in chronic kidney disease: Secondary | ICD-10-CM | POA: Diagnosis not present

## 2019-07-04 DIAGNOSIS — N186 End stage renal disease: Secondary | ICD-10-CM | POA: Diagnosis not present

## 2019-07-04 DIAGNOSIS — N2581 Secondary hyperparathyroidism of renal origin: Secondary | ICD-10-CM | POA: Diagnosis not present

## 2019-07-04 DIAGNOSIS — E1129 Type 2 diabetes mellitus with other diabetic kidney complication: Secondary | ICD-10-CM | POA: Diagnosis not present

## 2019-07-04 MED FILL — HUMALOG 100 UNITS/ML KWIKPE: 100 | 75 days supply | Qty: 9 | Fill #0

## 2019-07-04 NOTE — Telephone Encounter (Signed)
Nephrology completed his transition of care call, so I have called and spoken with Mrs Berdine Addison and let her know that we have him scheduled to see Dr Posey Pronto on Tuesday Nov 3 arrive by 10:20 for a 10:40 appointment. She is familiar with our office. I advised that we mailed paperwork with an appointment on Monday, but when I saw he was HD patient, we changed to Tuesday Nov 3.

## 2019-07-04 NOTE — Telephone Encounter (Signed)
Patient wife calling to speak to someone about her husband having another seizures. This is the second one he has had while finishing up dialysis. She is concerned about him having these please call

## 2019-07-04 NOTE — Telephone Encounter (Signed)
Will dialysis trigger seizures?

## 2019-07-05 ENCOUNTER — Other Ambulatory Visit: Payer: Self-pay | Admitting: *Deleted

## 2019-07-05 ENCOUNTER — Encounter: Payer: Self-pay | Admitting: *Deleted

## 2019-07-05 ENCOUNTER — Other Ambulatory Visit: Payer: Self-pay

## 2019-07-05 ENCOUNTER — Telehealth: Payer: Self-pay | Admitting: Oncology

## 2019-07-05 ENCOUNTER — Inpatient Hospital Stay: Payer: Medicare Other

## 2019-07-05 ENCOUNTER — Inpatient Hospital Stay: Payer: Medicare Other | Attending: Oncology | Admitting: Oncology

## 2019-07-05 ENCOUNTER — Telehealth: Payer: Self-pay | Admitting: Neurology

## 2019-07-05 ENCOUNTER — Telehealth: Payer: Self-pay | Admitting: *Deleted

## 2019-07-05 ENCOUNTER — Telehealth: Payer: Self-pay

## 2019-07-05 VITALS — BP 128/61 | HR 54 | Temp 98.3°F | Resp 17 | Ht 72.0 in | Wt 179.0 lb

## 2019-07-05 DIAGNOSIS — N189 Chronic kidney disease, unspecified: Secondary | ICD-10-CM | POA: Diagnosis not present

## 2019-07-05 DIAGNOSIS — C9000 Multiple myeloma not having achieved remission: Secondary | ICD-10-CM | POA: Insufficient documentation

## 2019-07-05 DIAGNOSIS — I6200 Nontraumatic subdural hemorrhage, unspecified: Secondary | ICD-10-CM | POA: Insufficient documentation

## 2019-07-05 DIAGNOSIS — S065XAA Traumatic subdural hemorrhage with loss of consciousness status unknown, initial encounter: Secondary | ICD-10-CM

## 2019-07-05 DIAGNOSIS — Z992 Dependence on renal dialysis: Secondary | ICD-10-CM | POA: Diagnosis not present

## 2019-07-05 DIAGNOSIS — R569 Unspecified convulsions: Secondary | ICD-10-CM | POA: Diagnosis not present

## 2019-07-05 DIAGNOSIS — Z79899 Other long term (current) drug therapy: Secondary | ICD-10-CM | POA: Insufficient documentation

## 2019-07-05 DIAGNOSIS — Z9484 Stem cells transplant status: Secondary | ICD-10-CM | POA: Insufficient documentation

## 2019-07-05 DIAGNOSIS — R1312 Dysphagia, oropharyngeal phase: Secondary | ICD-10-CM

## 2019-07-05 DIAGNOSIS — R7881 Bacteremia: Secondary | ICD-10-CM | POA: Insufficient documentation

## 2019-07-05 DIAGNOSIS — S065X9A Traumatic subdural hemorrhage with loss of consciousness of unspecified duration, initial encounter: Secondary | ICD-10-CM

## 2019-07-05 DIAGNOSIS — I639 Cerebral infarction, unspecified: Secondary | ICD-10-CM | POA: Insufficient documentation

## 2019-07-05 DIAGNOSIS — D631 Anemia in chronic kidney disease: Secondary | ICD-10-CM | POA: Insufficient documentation

## 2019-07-05 HISTORY — DX: Dysphagia, oropharyngeal phase: R13.12

## 2019-07-05 LAB — CMP (CANCER CENTER ONLY)
ALT: 57 U/L — ABNORMAL HIGH (ref 0–44)
AST: 43 U/L — ABNORMAL HIGH (ref 15–41)
Albumin: 4.1 g/dL (ref 3.5–5.0)
Alkaline Phosphatase: 94 U/L (ref 38–126)
Anion gap: 15 (ref 5–15)
BUN: 39 mg/dL — ABNORMAL HIGH (ref 6–20)
CO2: 30 mmol/L (ref 22–32)
Calcium: 8.5 mg/dL — ABNORMAL LOW (ref 8.9–10.3)
Chloride: 98 mmol/L (ref 98–111)
Creatinine: 8.97 mg/dL (ref 0.61–1.24)
GFR, Est AFR Am: 7 mL/min — ABNORMAL LOW (ref >60)
GFR, Estimated: 6 mL/min — ABNORMAL LOW (ref >60)
Glucose, Bld: 125 mg/dL — ABNORMAL HIGH (ref 70–99)
Potassium: 4.4 mmol/L (ref 3.5–5.1)
Sodium: 143 mmol/L (ref 135–145)
Total Bilirubin: 1 mg/dL (ref 0.3–1.2)
Total Protein: 7.6 g/dL (ref 6.5–8.1)

## 2019-07-05 LAB — CBC WITH DIFFERENTIAL (CANCER CENTER ONLY)
Abs Immature Granulocytes: 0.02 10*3/uL (ref 0.00–0.07)
Basophils Absolute: 0.1 10*3/uL (ref 0.0–0.1)
Basophils Relative: 2 %
Eosinophils Absolute: 0.5 10*3/uL (ref 0.0–0.5)
Eosinophils Relative: 8 %
HCT: UNDETERMINED % (ref 39.0–52.0)
Hemoglobin: 9.5 g/dL — ABNORMAL LOW (ref 13.0–17.0)
Immature Granulocytes: 0 %
Lymphocytes Relative: 7 %
Lymphs Abs: 0.4 10*3/uL — ABNORMAL LOW (ref 0.7–4.0)
MCH: UNDETERMINED pg (ref 26.0–34.0)
MCHC: UNDETERMINED g/dL (ref 30.0–36.0)
MCV: UNDETERMINED fL (ref 80.0–100.0)
Monocytes Absolute: 0.7 10*3/uL (ref 0.1–1.0)
Monocytes Relative: 11 %
Neutro Abs: 4.6 10*3/uL (ref 1.7–7.7)
Neutrophils Relative %: 72 %
Platelet Count: 286 10*3/uL (ref 150–400)
RBC: UNDETERMINED MIL/uL (ref 4.22–5.81)
RDW: UNDETERMINED % (ref 11.5–15.5)
WBC Count: 6.3 10*3/uL (ref 4.0–10.5)
nRBC: 0.5 % — ABNORMAL HIGH (ref 0.0–0.2)

## 2019-07-05 NOTE — Telephone Encounter (Signed)
Transition Care Management Follow-up Telephone Call  Date of discharge and from where: 06/30/2019 from [facility]  How have you been since you were released from the hospital? He has been feeling fair.  Any questions or concerns? Concerned about his blood pressure because [facility] added another pill and if the blood pressure is low Mrs. [facility] said she doesn't give him the additional pill.  Items Reviewed:  Did the pt receive and understand the discharge instructions provided? Yes  Medications obtained and verified? No  Any new allergies since your discharge? No  Dietary orders reviewed?No  Do you have support at home? Yes  Other (ie: DME, Home Health, etc) No  Functional Questionnaire: (I = Independent and D = Dependent) ADL's: D  Bathing/Dressing- I   Meal Prep- D  Eating- I  Maintaining continence- I  Transferring/Ambulation- D  Managing Meds- I   Follow up appointments reviewed:    PCP Hospital f/u appt confirmed?  Scheduled to see Dr. Baird Cancer on 07/11/2019 @ 4:30 pm.  [facility] f/u appt confirmed?  Scheduled to see Dr. Posey Pronto  on  07/11/2019 @10 :40 am .  Are transportation arrangements needed? No  If their condition worsens, is the pt aware to call  their PCP or go to the ED? Yes  Was the patient provided with contact information for the PCP's office or ED?Yes  Was the pt encouraged to call back with questions or concerns? Yes

## 2019-07-05 NOTE — Telephone Encounter (Signed)
Spoke to wife, see other phone note. Ashley, pls order head CT without contrast, dx: subdural hematoma and seizures. Thanks!

## 2019-07-05 NOTE — Progress Notes (Signed)
Hematology and Oncology Follow Up Visit  [place] [id number] 01-03-1966 53 y.o. 07/05/2019 12:25 PM    Principle Diagnosis: 53 year old man with  1.  IgG kappa multiple myeloma diagnosed in September 2019.  He had initially MGUS and subsequently developed increased plasma involvement in the bone marrow up to 20%.  2.  Chronic renal failure currently hemodialysis dependent.  Current therapy: Velcade dexamethasone and cyclophosphamide weekly started on 05/25/2018.  Velcade is given at 1.5 mg/m weekly, dexamethasone is 20 mg weekly and and Cytoxan is given at 650 mg total dose.  Last treatment was given in August 2020.  Therapy has been on hold in anticipation of high-dose chemotherapy and stem cell transplant.  Interim History:  Isaiah [name] is here for a follow-up visit.  Since the last visit, he was hospitalized in September 2020 in anticipation for high-dose chemotherapy and stem cell transplant but he did not undergo this therapy because of multiple complications.  He developed aspiration pneumonia and Proteus bacteremia.  He subsequently started developing seizures and found to have subdural hematoma that is chronic and contributing to his seizures.  He was admitted to inpatient rehab on October 6 June 30, 2019 and has been discharged home since that time.  He still have some fatigue and tiredness although still overall weak.  He does use a walker at home without any falls.  He continues to receive dialysis although has reported issues with seizures while receiving dialysis.  He denies any abdominal pain discomfort.  He denies any fevers or chills.   He denied any alteration mental status, neuropathy, confusion or dizziness.  Denies any headaches or lethargy.  Denies any night sweats, weight loss or changes in appetite.  Denied orthopnea, dyspnea on exertion or chest discomfort.  Denies shortness of breath, difficulty breathing hemoptysis or cough.  Denies any abdominal distention,  nausea, early satiety or dyspepsia.  Denies any hematuria, frequency, dysuria or nocturia.  Denies any skin irritation, dryness or rash.  Denies any ecchymosis or petechiae.  Denies any lymphadenopathy or clotting.  Denies any heat or cold intolerance.  Denies any anxiety or depression.  Remaining review of system is negative.              Medications: I have reviewed the patient's current medications.  Current Outpatient Medications:    acetaminophen (TYLENOL) 325 MG tablet, Take 1-2 tablets (325-650 mg total) by mouth every 4 (four) hours as needed for mild pain., Disp:  , Rfl:    acyclovir (ZOVIRAX) 400 MG tablet, Take 400 mg by mouth at bedtime., Disp: , Rfl:    albuterol (PROAIR HFA) 108 (90 Base) MCG/ACT inhaler, Inhale two puffs every 4-6 hours if needed for cough or wheeze, Disp: 1 Inhaler, Rfl: 1   amLODipine (NORVASC) 2.5 MG tablet, Take 1 tablet (2.5 mg total) by mouth at bedtime., Disp: 30 tablet, Rfl: 0   calcium carbonate (TUMS - DOSED IN MG ELEMENTAL CALCIUM) 500 MG chewable tablet, Chew 2 tablets by mouth at bedtime., Disp: , Rfl:    camphor-menthol (SARNA) lotion, Apply 1 application topically every 8 (eight) hours as needed for itching., Disp: 222 mL, Rfl: 0   Cetirizine HCl 10 MG CAPS, Take 1 capsule by mouth daily., Disp: , Rfl:    cinacalcet (SENSIPAR) 60 MG tablet, Take 60 mg by mouth every evening., Disp: , Rfl:    ciprofloxacin (CIPRO) 500 MG tablet, Take 500 mg by mouth every evening. For 120 days, Disp: , Rfl:    clonazePAM (  KLONOPIN) 0.5 MG tablet, Take 0.5 mg by mouth every Monday, Wednesday, and Friday with hemodialysis., Disp: , Rfl:    fluticasone furoate-vilanterol (BREO ELLIPTA) 200-25 MCG/INH AEPB, Inhale 1 puff into the lungs daily., Disp: , Rfl:    hydrOXYzine (ATARAX/VISTARIL) 25 MG tablet, Take 1 tablet (25 mg total) by mouth every 8 (eight) hours as needed for itching., Disp: 30 tablet, Rfl: 0   Lacosamide (VIMPAT) 150 MG TABS, Take 150  mg by mouth See admin instructions. Take 1 capsule (168m) by mouth twice daily all day except on Mon, Wed, Fri take 1 capsule in the morning, 1 capsule after dialysis, and 1 capsule at bedtime., Disp: , Rfl:    Lacosamide (VIMPAT) 150 MG TABS, TAKE 1 TABLET BY MOUTH TWICE DAILY TAKE AN ADDITIONAL 1 TABLET AFTER DIALYSIS SESSION THREE TIMES A WEEK., Disp: 225 tablet, Rfl: 3   lamoTRIgine (LAMICTAL) 150 MG tablet, Take 150 mg by mouth 2 (two) times daily., Disp: , Rfl:    metoprolol tartrate (LOPRESSOR) 25 MG tablet, Take 12.5 mg by mouth 2 (two) times daily., Disp: , Rfl:    multivitamin (RENA-VIT) TABS tablet, Take 1 tablet by mouth at bedtime., Disp: , Rfl:    pantoprazole (PROTONIX) 40 MG tablet, Take 40 mg by mouth daily., Disp: , Rfl:    prochlorperazine (COMPAZINE) 10 MG tablet, TAKE 1 TABLET (10 MG TOTAL) BY MOUTH EVERY 6 (SIX) HOURS AS NEEDED FOR NAUSEA OR VOMITING., Disp: 30 tablet, Rfl: 0  Allergies:  Allergies  Allergen Reactions   Codeine Rash and Other (See Comments)    Unknown reaction (patient says it was more serious than just a rash, but he can't remember what happened) Unknown reaction (patient says it was more serious than just a rash, but he can't remember what happened)   Codeine Swelling    Past Medical History, Surgical history, Social history, and Family History were reviewed and updated.  Physical Exam:  Blood pressure 128/61, pulse (!) 54, temperature 98.3 F (36.8 C), temperature source Oral, resp. rate 17, height 6' (1.829 m), weight 179 lb (81.2 kg), SpO2 99 %.     ECOG: 1    General appearance: Alert, awake without any distress. Head: Atraumatic without abnormalities Oropharynx: Without any thrush or ulcers. Eyes: No scleral icterus. Lymph nodes: No lymphadenopathy noted in the cervical, supraclavicular, or axillary nodes Heart:regular rate and rhythm, without any murmurs or gallops.   Lung: Clear to auscultation without any rhonchi, wheezes  or dullness to percussion. Abdomin: Soft, nontender without any shifting dullness or ascites. Musculoskeletal: No clubbing or cyanosis. Neurological: No motor or sensory deficits. Skin: No rashes or lesions. Psychiatric: Mood and affect appeared normal.             Lab Results: Lab Results  Component Value Date   WBC 6.0 06/29/2019   HGB 9.1 (L) 06/29/2019   HCT RESULTS UNAVAILABLE DUE TO INTERFERING SUBSTANCE 06/29/2019   MCV RESULTS UNAVAILABLE DUE TO INTERFERING SUBSTANCE 06/29/2019   PLT 274 06/29/2019     Chemistry      Component Value Date/Time   NA 134 (L) 06/29/2019 1338   NA 138 07/03/2017 0813   K 3.7 06/29/2019 1338   K 4.2 07/03/2017 0813   CL 94 (L) 06/29/2019 1338   CO2 25 06/29/2019 1338   CO2 27 07/03/2017 0813   BUN 37 (H) 06/29/2019 1338   BUN 42 (A) 02/16/2019   BUN 46.4 (H) 07/03/2017 0813   CREATININE 10.19 (H) 06/29/2019 1338  CREATININE 7.35 (HH) 04/26/2019 0837   CREATININE 10.9 (HH) 07/03/2017 0813      Component Value Date/Time   CALCIUM 7.5 (L) 06/29/2019 1338   CALCIUM 9.3 07/03/2017 0813   ALKPHOS 78 06/09/2019 1047   ALKPHOS 77 07/03/2017 0813   AST 18 06/09/2019 1047   AST 14 (L) 04/26/2019 0837   AST 16 07/03/2017 0813   ALT 9 06/09/2019 1047   ALT 7 04/26/2019 0837   ALT 14 07/03/2017 0813   BILITOT 0.7 06/09/2019 1047   BILITOT 0.8 04/26/2019 0837   BILITOT 0.93 07/03/2017 0813       06/07/2019 05/12/2019 03/31/2019 03/01/2019 12/07/2018 10/26/2018 08/31/2018 05/04/2018   8.9 (H) 6.8 7.3 6.4 6.2 7.0 7.6 10.9 (H)  5.22 (H) 4.33 4.64 3.88 3.67 4.14 4.40 4.71  0.60 (H) 0.36 0.41 0.39 0.39 0.38 0.36 0.37  1.18 (H) 0.67 0.68 0.70 0.65 0.70 0.61 0.69  0.65 0.48 (L) 0.55 0.49 (L) 0.50 0.54 0.53 0.56  1.25 0.96 1.01 0.93 0.99 1.24 1.70 (H) 4.58 (H)  0.10 (H)    0.53 (H) 0.60 (H) 1.16 (H) 3.98                 Impression and Plan:  53 year old man with:  1.  IgG kappa multiple myeloma diagnosed in 2019 arising from  MGUS.  He has received induction therapy with Velcade Cytoxan and dexamethasone as outlined above with the last treatment was August 2020.  Therapy was withheld in anticipation for high-dose chemotherapy and stem cell transplant.  The natural course of this disease as well as laboratory data from 06/07/2019 was personally reviewed and discussed with the patient.  His last M spike was 0.1 g/dL with a normal IgG level at that time.  Risks and benefits of restarting maintenance therapy at this time was reviewed and this option is deferred for the time being for multiple reasons.  He is still recovering from his recent hospitalization illnesses, his M spike remains under excellent control and he still under evaluation for possible high-dose chemotherapy.  I anticipate restarting maintenance therapy in the near future after he has evaluation by at [facility] regarding his future transplant options.  Protein studies from today are currently pending and will communicate those to him in the near future.     2. Anemia: Hemoglobin on 05/30/2019 was 9.1.  His anemia is related to renal disease as well as malignancy.  3.  Proteus bacteremia: Blood culture on June 09, 2019 were at negative.  He has no signs or symptoms of bacteremia at this time.  4.  Goals of care: His disease is incurable although aggressive measures are warranted given his reasonable performance status.   5. Follow-up: In 4 weeks for repeat evaluation.  25  minutes was spent with the patient face-to-face today.  More than 50% of time was spent on updating his disease status, reviewing laboratory data and discussing treatment options moving forward.      Zola Button, MD 10/27/202012:25 PM

## 2019-07-05 NOTE — Telephone Encounter (Signed)
Spoke to wife, he has had an increase in seizures, he had one last Friday and one yesterday, just before he gets off the dialysis machine. He usually takes the clonazepam soon after dialysis so he had not taken it when seizures occurred. Instructed to take clonazepam mid-way through dialysis (around 2pm). If seizures continue, will increase dose. His last head CT on 10/12 continued to show right subdural bleed which is also likely contributing to increase in seizure frequency. Recommend repeating interval head CT without contrast. Wife knows to call for any changes.

## 2019-07-05 NOTE — Telephone Encounter (Signed)
Order placed

## 2019-07-05 NOTE — Telephone Encounter (Signed)
CRITICAL VALUE STICKER  CRITICAL VALUE: Creat = 8.97.  RECEIVER (on-site recipient of call): Mining engineer, Triage [place].   DATE & TIME NOTIFIED: 07/05/2019 at 1234.   MESSENGER (representative from lab): Rosann Auerbach MT [facility].  MD NOTIFIED: Collaborative nurse.  TIME OF NOTIFICATION: 07/05/2019 at 1239.  RESPONSE: None.

## 2019-07-05 NOTE — Telephone Encounter (Signed)
Wife is calling in about patient having seizure every time he goes to dialysis- She was wanting a sooner appt and patient is willing to miss dialysis appt to see Dr. Delice Lesch. Wife unsure of what to do. OK to use Working in 11:30 spot this Friday or another time? Thanks!

## 2019-07-05 NOTE — Telephone Encounter (Signed)
Lab called with critical Scr 8.97. Dr. Alen Blew made aware, no new orders. Patient is on HD.

## 2019-07-05 NOTE — Telephone Encounter (Signed)
Scheduled appt per 10/27 los.  Sent a staf message to get a calendar mailed out.

## 2019-07-05 NOTE — Patient Outreach (Signed)
[facility]) Care Management  07/05/2019  [place] 1966/04/16 [id number]   Transition of care call/case closure   Referral received: 06/14/19 Initial outreach: `07/01/19 Insurance: [place] [employer] Plan   Subjective: Patient's [name] who is on record as designated party of release for HIPPA information returned call to this [facility] after message was left on 10/23.  2 HIPAA identifiers verified. Explained purpose of call and completed transition of care assessment.  Mrs Washington states patient is having more seizures and she has been in touch with the neurologist and a CT of the brain was ordered today. She also states that patient's gait is becoming more unsteady so patient will begin receiving home health physical therapy provided by the neurologist office later this week. Mrs Washington says they have all needed DME. They do not have a wheelchair but she states patient doesn't need on at this time.  Mr Washington is enrolled and active in [place] Management program for his chronic disease states of heart failure and Type 2 DM.  Mrs. Newsham  says they use a [facility] outpatient pharmacy.  She denies educational needs related to staying safe during the COVID 19 pandemic.     Objective: Per the electronic medical record, Mr. Schaberg was hospitalized at [facility] from  10/1-10/6 for worsening neurological symptoms ( increased right sided weakness, slurred speech,  memory loss, and inability to walk) related to acute on chronic subdural hematoma. He was transferred to the acute rehabilitation unit on 10/6.   Comorbidities include: atrial fibrillation, HTN, chronic combined systolic and diastolic heart failure, history of ischemic left middle cerebral artery stroke, pulmonary emboli, aspiration pneumonia, asthma, OSA on CPAP, GI Bleed, Type 2 DM- most recent Hgb A1C= 4.8% on 06/14/19= ,  Right side hemiparesis, seizure disorder, ESRD- on hemodialysis M-W-F,  anemia, gout, hx of DVT, multiple myeloma, sickle cell thalassemia He was discharged to home on 06/30/19 with round the clock care to be provided by wife and son and with orders for home health physical therapy, occupational therapy, speech therapy and DME of tub seat to be provided by [name] agency.   Assessment:  Patient's wife  voices good understanding of all discharge instructions.  See transition of care flowsheet for assessment details.   Plan:  Reviewed hospital discharge diagnosis with patient's wife of acute on chronic subdural hematoma and discharge treatment plan using hospital discharge instructions, assessing medication adherence, reviewing problems requiring provider notification, and discussing the importance of follow up with neurosurgeon, primary care provider and specialists (neurologist and physiatrist) as directed. Using [place] website, verified that patient is an active participate in [place]'s Active Health Management chronic disease management program.   Reminded patient's wife of [place] Benefits Open Enrollment Dates: Oct 12th - 30th No ongoing care management needs identified so will close case to [place] Management services. Thanked patient's wife for her services as an Glass blower/designer for [employer].  Barrington Ellison RN,CCM,CDE [place] Management Coordinator Office Phone [phone] Office Fax [phone]

## 2019-07-06 ENCOUNTER — Ambulatory Visit: Payer: Self-pay | Admitting: *Deleted

## 2019-07-06 DIAGNOSIS — D509 Iron deficiency anemia, unspecified: Secondary | ICD-10-CM | POA: Diagnosis not present

## 2019-07-06 DIAGNOSIS — N186 End stage renal disease: Secondary | ICD-10-CM | POA: Diagnosis not present

## 2019-07-06 DIAGNOSIS — N2581 Secondary hyperparathyroidism of renal origin: Secondary | ICD-10-CM | POA: Diagnosis not present

## 2019-07-06 DIAGNOSIS — E1129 Type 2 diabetes mellitus with other diabetic kidney complication: Secondary | ICD-10-CM | POA: Diagnosis not present

## 2019-07-06 DIAGNOSIS — D631 Anemia in chronic kidney disease: Secondary | ICD-10-CM | POA: Diagnosis not present

## 2019-07-06 DIAGNOSIS — Z992 Dependence on renal dialysis: Secondary | ICD-10-CM | POA: Diagnosis not present

## 2019-07-06 LAB — KAPPA/LAMBDA LIGHT CHAINS
Kappa free light chain: 345.1 mg/L — ABNORMAL HIGH (ref 3.3–19.4)
Kappa, lambda light chain ratio: 2.65 — ABNORMAL HIGH (ref 0.26–1.65)
Lambda free light chains: 130.1 mg/L — ABNORMAL HIGH (ref 5.7–26.3)

## 2019-07-07 DIAGNOSIS — Z8701 Personal history of pneumonia (recurrent): Secondary | ICD-10-CM

## 2019-07-07 DIAGNOSIS — I69351 Hemiplegia and hemiparesis following cerebral infarction affecting right dominant side: Secondary | ICD-10-CM | POA: Diagnosis not present

## 2019-07-07 DIAGNOSIS — M1030 Gout due to renal impairment, unspecified site: Secondary | ICD-10-CM

## 2019-07-07 DIAGNOSIS — D509 Iron deficiency anemia, unspecified: Secondary | ICD-10-CM | POA: Diagnosis not present

## 2019-07-07 DIAGNOSIS — I69320 Aphasia following cerebral infarction: Secondary | ICD-10-CM | POA: Diagnosis not present

## 2019-07-07 DIAGNOSIS — N186 End stage renal disease: Secondary | ICD-10-CM | POA: Diagnosis not present

## 2019-07-07 DIAGNOSIS — E1122 Type 2 diabetes mellitus with diabetic chronic kidney disease: Secondary | ICD-10-CM | POA: Diagnosis not present

## 2019-07-07 DIAGNOSIS — E1140 Type 2 diabetes mellitus with diabetic neuropathy, unspecified: Secondary | ICD-10-CM

## 2019-07-07 DIAGNOSIS — C9000 Multiple myeloma not having achieved remission: Secondary | ICD-10-CM | POA: Diagnosis not present

## 2019-07-07 DIAGNOSIS — G4730 Sleep apnea, unspecified: Secondary | ICD-10-CM

## 2019-07-07 DIAGNOSIS — I120 Hypertensive chronic kidney disease with stage 5 chronic kidney disease or end stage renal disease: Secondary | ICD-10-CM | POA: Diagnosis not present

## 2019-07-07 DIAGNOSIS — I480 Paroxysmal atrial fibrillation: Secondary | ICD-10-CM | POA: Diagnosis not present

## 2019-07-07 DIAGNOSIS — Z86711 Personal history of pulmonary embolism: Secondary | ICD-10-CM | POA: Diagnosis not present

## 2019-07-07 LAB — MULTIPLE MYELOMA PANEL, SERUM
Albumin SerPl Elph-Mcnc: 4.3 g/dL (ref 2.9–4.4)
Albumin/Glob SerPl: 1.5 (ref 0.7–1.7)
Alpha 1: 0.2 g/dL (ref 0.0–0.4)
Alpha2 Glob SerPl Elph-Mcnc: 0.8 g/dL (ref 0.4–1.0)
B-Globulin SerPl Elph-Mcnc: 0.7 g/dL (ref 0.7–1.3)
Gamma Glob SerPl Elph-Mcnc: 1.1 g/dL (ref 0.4–1.8)
Globulin, Total: 2.9 g/dL (ref 2.2–3.9)
IgA: 92 mg/dL (ref 90–386)
IgG (Immunoglobin G), Serum: 1243 mg/dL (ref 603–1613)
IgM (Immunoglobulin M), Srm: 35 mg/dL (ref 20–172)
M Protein SerPl Elph-Mcnc: 0.2 g/dL — ABNORMAL HIGH
Total Protein ELP: 7.2 g/dL (ref 6.0–8.5)

## 2019-07-08 ENCOUNTER — Other Ambulatory Visit: Payer: Self-pay | Admitting: Neurology

## 2019-07-08 ENCOUNTER — Telehealth: Payer: Self-pay

## 2019-07-08 ENCOUNTER — Telehealth: Payer: Self-pay | Admitting: Neurology

## 2019-07-08 DIAGNOSIS — N186 End stage renal disease: Secondary | ICD-10-CM | POA: Diagnosis not present

## 2019-07-08 DIAGNOSIS — N2581 Secondary hyperparathyroidism of renal origin: Secondary | ICD-10-CM | POA: Diagnosis not present

## 2019-07-08 DIAGNOSIS — E1129 Type 2 diabetes mellitus with other diabetic kidney complication: Secondary | ICD-10-CM | POA: Diagnosis not present

## 2019-07-08 DIAGNOSIS — D631 Anemia in chronic kidney disease: Secondary | ICD-10-CM | POA: Diagnosis not present

## 2019-07-08 DIAGNOSIS — Z992 Dependence on renal dialysis: Secondary | ICD-10-CM | POA: Diagnosis not present

## 2019-07-08 DIAGNOSIS — D509 Iron deficiency anemia, unspecified: Secondary | ICD-10-CM | POA: Diagnosis not present

## 2019-07-08 MED ORDER — CLONAZEPAM 0.5 MG PO TABS: ORAL_TABLET | ORAL | 5 refills | Status: DC

## 2019-07-08 NOTE — Telephone Encounter (Signed)
Wife made aware that Dr. Delice Lesch does have FMLA forms and to allow 7-10d to complete.  She agreed to continue with giving pt 2 tabs of 0.5m of Clonazepam around 2pm during dialysis.  Confirmed that CT had been ordered and number given to [place] she wanted to call and schedule.  Wife will call back with update about Clonazepam since pt only had one dose that was effective. 2nd time pt still had seizure during dialysis.

## 2019-07-08 NOTE — Telephone Encounter (Signed)
Wife called in to let Dr. Delice Lesch know patient had a seizure today right after 1 PM that lasted a second. Thanks!

## 2019-07-08 NOTE — Telephone Encounter (Signed)
Patient's wife called to see if we've received FMLA forms for this patient yet.

## 2019-07-08 NOTE — Telephone Encounter (Signed)
Pls let wife know I have the FMLA. We had talked about increasing dose of clonazepam if he still has seizures. Pls have her give clonazepam 0.38m 2 tabs at around 2pm (mid-way) through dialysis. I will send in updated Rx, thanks

## 2019-07-08 NOTE — Telephone Encounter (Signed)
Dr. Delice Lesch,  Do you have these?

## 2019-07-08 NOTE — Telephone Encounter (Signed)
-----   Message from Wyatt Portela, MD sent at 07/08/2019  8:34 AM EDT ----- Please let him know that his M spike is still 0.2 which is unchanged in the last 3 months.

## 2019-07-08 NOTE — Telephone Encounter (Signed)
Spoke to patient's wife and made her aware that per Dr. Alen Blew, patient's M spike is still 0.2 which is unchanged in the last 3 months. She was appreciative for the call and verbalized understanding.

## 2019-07-10 DIAGNOSIS — Z992 Dependence on renal dialysis: Secondary | ICD-10-CM | POA: Diagnosis not present

## 2019-07-10 DIAGNOSIS — N186 End stage renal disease: Secondary | ICD-10-CM | POA: Diagnosis not present

## 2019-07-10 DIAGNOSIS — E1122 Type 2 diabetes mellitus with diabetic chronic kidney disease: Secondary | ICD-10-CM | POA: Diagnosis not present

## 2019-07-11 ENCOUNTER — Telehealth: Payer: Self-pay | Admitting: Neurology

## 2019-07-11 ENCOUNTER — Encounter: Payer: Self-pay | Admitting: Internal Medicine

## 2019-07-11 ENCOUNTER — Other Ambulatory Visit: Payer: Self-pay

## 2019-07-11 ENCOUNTER — Ambulatory Visit (INDEPENDENT_AMBULATORY_CARE_PROVIDER_SITE_OTHER): Payer: Medicare Other | Admitting: Internal Medicine

## 2019-07-11 ENCOUNTER — Encounter: Payer: 59 | Admitting: Physical Medicine & Rehabilitation

## 2019-07-11 VITALS — BP 108/62 | HR 63 | Temp 97.7°F | Ht 72.0 in | Wt 180.0 lb

## 2019-07-11 DIAGNOSIS — E1122 Type 2 diabetes mellitus with diabetic chronic kidney disease: Secondary | ICD-10-CM

## 2019-07-11 DIAGNOSIS — Z23 Encounter for immunization: Secondary | ICD-10-CM | POA: Diagnosis not present

## 2019-07-11 DIAGNOSIS — I120 Hypertensive chronic kidney disease with stage 5 chronic kidney disease or end stage renal disease: Secondary | ICD-10-CM

## 2019-07-11 DIAGNOSIS — E1129 Type 2 diabetes mellitus with other diabetic kidney complication: Secondary | ICD-10-CM | POA: Diagnosis not present

## 2019-07-11 DIAGNOSIS — Z992 Dependence on renal dialysis: Secondary | ICD-10-CM | POA: Diagnosis not present

## 2019-07-11 DIAGNOSIS — C9000 Multiple myeloma not having achieved remission: Secondary | ICD-10-CM | POA: Diagnosis not present

## 2019-07-11 DIAGNOSIS — S065XAA Traumatic subdural hemorrhage with loss of consciousness status unknown, initial encounter: Secondary | ICD-10-CM

## 2019-07-11 DIAGNOSIS — D631 Anemia in chronic kidney disease: Secondary | ICD-10-CM | POA: Diagnosis not present

## 2019-07-11 DIAGNOSIS — N186 End stage renal disease: Secondary | ICD-10-CM

## 2019-07-11 DIAGNOSIS — N2581 Secondary hyperparathyroidism of renal origin: Secondary | ICD-10-CM

## 2019-07-11 DIAGNOSIS — Z6836 Body mass index (BMI) 36.0-36.9, adult: Secondary | ICD-10-CM | POA: Diagnosis not present

## 2019-07-11 DIAGNOSIS — D509 Iron deficiency anemia, unspecified: Secondary | ICD-10-CM | POA: Diagnosis not present

## 2019-07-11 DIAGNOSIS — R251 Tremor, unspecified: Secondary | ICD-10-CM

## 2019-07-11 DIAGNOSIS — S065X9A Traumatic subdural hemorrhage with loss of consciousness of unspecified duration, initial encounter: Secondary | ICD-10-CM

## 2019-07-11 DIAGNOSIS — E66812 Obesity, class 2: Secondary | ICD-10-CM

## 2019-07-11 NOTE — Chronic Care Management (AMB) (Signed)
°  Chronic Care Management   Outreach Note  07/11/2019 Name: Isaiah [name] [facility] MRN: [id number] DOB: 1965-12-06  Referred by: [name], MD Reason for referral : Chronic Care Management (Initial CCM outreach was unsuccessful.), Chronic Care Management (Second CCM outreach ), and Chronic Care Management (Third CCM outreach )   Third telephone outreach was attempted today. The patient was referred to the case management team for assistance with care management and care coordination. The patient's primary care provider has been notified of our attempts to make or maintain contact with the patient. The care management team is pleased to engage with this patient at any time in the future should he/she be interested in assistance from the care management team.   Follow Up Plan: The care management team is available to follow up with the patient after provider conversation with the patient regarding recommendation for care management engagement and subsequent re-referral to the care management team.   [place]  [email or web address]   ??[phone]

## 2019-07-11 NOTE — Patient Instructions (Signed)
Tremor A tremor is trembling or shaking that you cannot control. Most tremors affect the hands or arms. Tremors can also affect the head, vocal cords, face, and other parts of the body. There are many types of tremors. Common types include:  Essential tremor. These usually occur in people older than 40. It may run in families and can happen in otherwise healthy people.  Resting tremor. These occur when the muscles are at rest, such as when your hands are resting in your lap. People with Parkinson's disease often have resting tremors.  Postural tremor. These occur when you try to hold a pose, such as keeping your hands outstretched.  Kinetic tremor. These occur during purposeful movement, such as trying to touch a finger to your nose.  Task-specific tremor. These may occur when you perform certain tasks such as writing, speaking, or standing.  Psychogenic tremor. These dramatically lessen or disappear when you are distracted. They can happen in people of all ages. Some types of tremors have no known cause. Tremors can also be a symptom of nervous system problems (neurological disorders) that may occur with aging. Some tremors go away with treatment, while others do not. Follow these instructions at home: Lifestyle      Limit alcohol intake to no more than 1 drink a day for nonpregnant women and 2 drinks a day for men. One drink equals 12 oz of beer, 5 oz of wine, or 1 oz of hard liquor.  Do not use any products that contain nicotine or tobacco, such as cigarettes and e-cigarettes. If you need help quitting, ask your health care provider.  Avoid extreme heat and extreme cold.  Limit your caffeine intake, as told by your health care provider.  Try to get 8 hours of sleep each night.  Find ways to manage your stress, such as meditation or yoga. General instructions  Take over-the-counter and prescription medicines only as told by your health care provider.  Keep all follow-up visits  as told by your health care provider. This is important. Contact a health care provider if you:  Develop a tremor after starting a new medicine.  Have a tremor along with other symptoms such as: ? Numbness. ? Tingling. ? Pain. ? Weakness.  Notice that your tremor gets worse.  Notice that your tremor interferes with your day-to-day life. Summary  A tremor is trembling or shaking that you cannot control.  Most tremors affect the hands or arms.  Some types of tremors have no known cause. Others may be a symptom of nervous system problems (neurological disorders).  Make sure you discuss any tremors you have with your health care provider. This information is not intended to replace advice given to you by your health care provider. Make sure you discuss any questions you have with your health care provider. Document Released: 08/15/2002 Document Revised: 08/07/2017 Document Reviewed: 06/25/2017 Elsevier Patient Education  2020 [employer].

## 2019-07-11 NOTE — Telephone Encounter (Signed)
Patient's wife called to report the patient had another seizure today during dialysis but they gave him the medicine and it helped. She also wants Dr. Delice Lesch to be aware he has a CT scheduled on Thursday, 07/14/19.

## 2019-07-11 NOTE — Telephone Encounter (Signed)
Noted, patient has f/u for tomorrow

## 2019-07-12 ENCOUNTER — Other Ambulatory Visit: Payer: Self-pay

## 2019-07-12 ENCOUNTER — Encounter: Payer: Medicare Other | Attending: Physical Medicine & Rehabilitation | Admitting: Physical Medicine & Rehabilitation

## 2019-07-12 ENCOUNTER — Telehealth: Payer: Self-pay

## 2019-07-12 ENCOUNTER — Ambulatory Visit (INDEPENDENT_AMBULATORY_CARE_PROVIDER_SITE_OTHER): Payer: Medicare Other | Admitting: Neurology

## 2019-07-12 ENCOUNTER — Encounter: Payer: Self-pay | Admitting: Neurology

## 2019-07-12 ENCOUNTER — Encounter: Payer: Self-pay | Admitting: Physical Medicine & Rehabilitation

## 2019-07-12 VITALS — BP 135/79 | HR 80 | Ht 72.0 in | Wt 178.0 lb

## 2019-07-12 VITALS — BP 103/64 | HR 73 | Temp 97.0°F | Ht 72.0 in | Wt 180.0 lb

## 2019-07-12 DIAGNOSIS — Z0279 Encounter for issue of other medical certificate: Secondary | ICD-10-CM

## 2019-07-12 DIAGNOSIS — I69320 Aphasia following cerebral infarction: Secondary | ICD-10-CM | POA: Diagnosis not present

## 2019-07-12 DIAGNOSIS — R7881 Bacteremia: Secondary | ICD-10-CM | POA: Insufficient documentation

## 2019-07-12 DIAGNOSIS — I120 Hypertensive chronic kidney disease with stage 5 chronic kidney disease or end stage renal disease: Secondary | ICD-10-CM | POA: Diagnosis not present

## 2019-07-12 DIAGNOSIS — S065XAA Traumatic subdural hemorrhage with loss of consciousness status unknown, initial encounter: Secondary | ICD-10-CM

## 2019-07-12 DIAGNOSIS — G40209 Localization-related (focal) (partial) symptomatic epilepsy and epileptic syndromes with complex partial seizures, not intractable, without status epilepticus: Secondary | ICD-10-CM

## 2019-07-12 DIAGNOSIS — G8110 Spastic hemiplegia affecting unspecified side: Secondary | ICD-10-CM

## 2019-07-12 DIAGNOSIS — C9000 Multiple myeloma not having achieved remission: Secondary | ICD-10-CM | POA: Diagnosis not present

## 2019-07-12 DIAGNOSIS — I69351 Hemiplegia and hemiparesis following cerebral infarction affecting right dominant side: Secondary | ICD-10-CM | POA: Diagnosis not present

## 2019-07-12 DIAGNOSIS — Z8673 Personal history of transient ischemic attack (TIA), and cerebral infarction without residual deficits: Secondary | ICD-10-CM | POA: Diagnosis not present

## 2019-07-12 DIAGNOSIS — G40909 Epilepsy, unspecified, not intractable, without status epilepticus: Secondary | ICD-10-CM | POA: Insufficient documentation

## 2019-07-12 DIAGNOSIS — S065X9A Traumatic subdural hemorrhage with loss of consciousness of unspecified duration, initial encounter: Secondary | ICD-10-CM

## 2019-07-12 DIAGNOSIS — R269 Unspecified abnormalities of gait and mobility: Secondary | ICD-10-CM | POA: Diagnosis not present

## 2019-07-12 DIAGNOSIS — N186 End stage renal disease: Secondary | ICD-10-CM | POA: Insufficient documentation

## 2019-07-12 DIAGNOSIS — Z992 Dependence on renal dialysis: Secondary | ICD-10-CM | POA: Insufficient documentation

## 2019-07-12 DIAGNOSIS — E1122 Type 2 diabetes mellitus with diabetic chronic kidney disease: Secondary | ICD-10-CM | POA: Diagnosis not present

## 2019-07-12 HISTORY — DX: Spastic hemiplegia affecting unspecified side: G81.10

## 2019-07-12 MED ORDER — LACOSAMIDE 200 MG PO TABS: ORAL_TABLET | ORAL | 5 refills | Status: DC

## 2019-07-12 MED ORDER — LAMOTRIGINE 150 MG PO TABS: 150.0000 mg | ORAL_TABLET | Freq: Two times a day (BID) | ORAL | 3 refills | Status: DC

## 2019-07-12 NOTE — Patient Instructions (Signed)
1. Increase Vimpat to 253m twice a day, on dialysis days, give additional 2085mtablet after dialysis  2. Continue Lamotrigine 15079mwice a day  3. Take the clonazepam 0.5mg75mtabs 30 mins prior to dialysis  4. Proceed with head CT on Thursday  5. Follow-up in 3-4 months, call for any changes

## 2019-07-12 NOTE — Telephone Encounter (Signed)
please call Kathryne Eriksson at [facility] on Tuesday to see if he can get flu vaccine since stem cell transplant has been delayed  Dr. Dione Housekeeper at [name], NP - [phone] Kathryne Eriksson is the nurse coordinator)

## 2019-07-12 NOTE — Progress Notes (Signed)
Subjective:    Patient ID: Isaiah [name], male    DOB: 1965/11/08, 53 y.o.   MRN: [id number]  HPI Male with history of T2DM, A fib, ESRD-HD MWF, CVA, DVT/PE s/p IVC filter, seizure disorder, MM with recent admission to [facility] for stem cell transplant but procedure aborted due to findings of proteus mirabilis bacteremia, aspiration PNA, acute on chronic SDH, findings of bilateral renal calculi as well as breakthrough seizures presents for hospital follow up after receiving CIR for acute on chronic SDH.  Admit date: 06/14/2019 Discharge date: 06/30/2019  At discharge, he was instructed to follow up with Neurosurg, but he does not have an appointment.  Wife supplements history. He has been having seizures in HD and is going to Neuro for possible repeat CT.  He saw his PCP. He is still on Cipro. CBGs have been controlled. BP has been relatively controlled. Had a fall turning around too quickly.   Therapies: 2/week. DME: Shower chair Mobility: Walker at all times  Pain Inventory Average Pain 0 Pain Right Now 0 My pain is na  In the last 24 hours, has pain interfered with the following? General activity 0 Relation with others 0 Enjoyment of life 0 What TIME of day is your pain at its worst? na Sleep (in general) Fair  Pain is worse with: na Pain improves with: na Relief from Meds: 0  Mobility walk with assistance use a cane use a walker ability to climb steps?  yes do you drive?  no  Function disabled: date disabled . retired  Neuro/Psych weakness trouble walking confusion  Prior Studies Any changes since last visit?  no  Physicians involved in your care Any changes since last visit?  no   Family History  Problem Relation Age of Onset  . Hypertension Mother   . Hypertension Father   . Stroke Father   . Sickle cell anemia Brother   . Diabetes Mother   . Sickle cell anemia Brother   . Diabetes type I Brother   . Kidney disease Brother    Social History    Socioeconomic History  . Marital status: Married    Spouse name: sylvia  . Number of children: 1  . Years of education: college  . Highest education level: Not on file  Occupational History  . Occupation: [employer]  . Occupation: disability  Social Needs  . Financial resource strain: Not hard at all  . Food insecurity    Worry: Never true    Inability: Never true  . Transportation needs    Medical: No    Non-medical: No  Tobacco Use  . Smoking status: Never Smoker  . Smokeless tobacco: Never Used  Substance and Sexual Activity  . Alcohol use: Not Currently  . Drug use: Never  . Sexual activity: Yes  Lifestyle  . Physical activity    Days per week: 0 days    Minutes per session: 0 min  . Stress: Not at all  Relationships  . Social Herbalist on phone: Not on file    Gets together: Not on file    Attends religious service: Not on file    Active member of club or organization: Not on file    Attends meetings of clubs or organizations: Not on file    Relationship status: Not on file  Other Topics Concern  . Not on file  Social History Narrative   ** Merged History Encounter **  Pt lives in 2 story home with his wife and 1 son Has masters degree in psychology Currently disabled.     Past Surgical History:  Procedure Laterality Date  . AV FISTULA PLACEMENT Right 12/05/2015   Procedure: INSERTION OF ARTERIOVENOUS (AV) GORE-TEX GRAFT ARM;  Surgeon: Elam Dutch, MD;  Location: [place];  Service: Vascular;  Laterality: Right;  . BONE MARROW BIOPSY    . CHOLECYSTECTOMY    . CHOLECYSTECTOMY  1990's?  . COLONOSCOPY WITH PROPOFOL N/A 01/22/2017   Procedure: COLONOSCOPY WITH PROPOFOL;  Surgeon: Wilford Corner, MD;  Location: [facility];  Service: Endoscopy;  Laterality: N/A;  . EYE SURGERY Right   . INSERTION OF DIALYSIS CATHETER Right 10/16/2015   Procedure: INSERTION OF PALINDROME DIALYSIS CATHETER ;  Surgeon: Elam Dutch, MD;   Location: [place];  Service: Vascular;  Laterality: Right;  . OTHER SURGICAL HISTORY     Retinal surgery  . PARS PLANA VITRECTOMY  02/17/2012   Procedure: PARS PLANA VITRECTOMY WITH 25 GAUGE;  Surgeon: Hayden Pedro, MD;  Location: [place];  Service: Ophthalmology;  Laterality: Right;  . PERIPHERAL VASCULAR CATHETERIZATION N/A 03/20/2016   Procedure: A/V Shuntogram/Fistulagram;  Surgeon: Conrad Linneus, MD;  Location: [place] [facility];  Service: Cardiovascular;  Laterality: N/A;  . TEE WITHOUT CARDIOVERSION N/A 12/02/2016   Procedure: TRANSESOPHAGEAL ECHOCARDIOGRAM (TEE);  Surgeon: Larey Dresser, MD;  Location: [place];  Service: Cardiovascular;  Laterality: N/A;   Past Medical History:  Diagnosis Date  . A-fib ([place])   . Anemia   . Asthma   . Diabetes mellitus without complication ([place])   . DM type 2 (diabetes mellitus, type 2) ([place]) 06/09/2019  . ESRD (end stage renal disease) on dialysis ([place])   . Essential hypertension 06/09/2019  . GIB (gastrointestinal bleeding)    Recurrent episodes- 09/2014, 09/2015 and 05/2016  . Gout   . History of recent blood transfusion 10/27/14   2 Units PRBC's  . Hyperkalemia 07/2011  . Hypertension   . Monoclonal gammopathy   . Multiple myeloma ([place])   . OSA on CPAP   . Pulmonary emboli ([place]) 07/2011   bilateral--treated with comadin X 6 months  . Pulmonary embolism ([place]) 07/2011; 09/27/2014   a. Bilat PE 07/2011 - unclear cause, tx with 6 months Coumadin.;   . Seizure disorder ([place]) 06/09/2019  . Seizures ([place])   . Sepsis ([place])   . Sickle cell-thalassemia disease ([place])    a. Sickle cell trait.  . Sleep apnea   . Stroke ([place])   . Stroke ([place]) 09/2015   R-MCA, L-MCA, PCA and bilateral cerebellar complicated by DVT/PE  . Subdural hematoma ([facility]) 05/2019   BP 103/64   Pulse 73   Temp (!) 97 F (36.1 C)   Ht 6' (1.829 m)   Wt 180 lb (81.6 kg)   SpO2 94%   BMI 24.41 kg/m   Opioid Risk Score:   Fall Risk Score:  `1  Depression screen PHQ  2/9  Depression screen [facility] 2/9 03/17/2019 08/19/2018  Decreased Interest 0 0  Down, Depressed, Hopeless 0 0  PHQ - 2 Score 0 0  Altered sleeping 0 -  Tired, decreased energy 0 -  Change in appetite 0 -  Feeling bad or failure about yourself  0 -  Trouble concentrating 0 -  Moving slowly or fidgety/restless 1 -  Suicidal thoughts 0 -  PHQ-9 Score 1 -  Difficult doing work/chores Not difficult at all -  Some  recent data might be hidden     Review of Systems  Constitutional: Positive for unexpected weight change.  HENT: Negative.   Eyes: Negative.   Respiratory: Negative.   Cardiovascular: Negative.   Gastrointestinal: Negative.   Endocrine: Negative.   Genitourinary: Negative.   Musculoskeletal: Positive for gait problem.  Skin: Negative.   Allergic/Immunologic: Negative.   Neurological: Positive for weakness.  Hematological: Negative.   Psychiatric/Behavioral: Positive for confusion.      Objective:   Physical Exam  Constitutional: No distress . Vital signs reviewed. HENT: Normocephalic.  Atraumatic. Eyes: EOMI. No discharge. Cardiovascular: No JVD. Respiratory: Normal effort.  No stridor. GI: Non-distended. Skin: Warm and dry.  Intact. Psych: Normal mood.  Normal behavior. Musc: No edema in extremities.  No tenderness in extremities. Neurologic: Alert Dysarthria Facial weakness Motor: LUE/LLE: 5/5 proximal distal RUE: 4+/5 proximal to distal, stable RLE: 4+/5 proximal to distal    Assessment & Plan:  Male with history of T2DM, A fib, ESRD-HD MWF, CVA, DVT/PE s/p IVC filter, seizure disorder, MM with recent admission to [facility] for stem cell transplant but procedure aborted due to findings of proteus mirabilis bacteremia, aspiration PNA, acute on chronic SDH, findings of bilateral renal calculi as well as breakthrough seizures presents for hospital follow up after receiving CIR for acute on chronic SDH.  1. Impaired ADLs/mobility and function secondary to acute on  chronic SDH with 5 mm of midline shift to left- with R hemiparesis and dysarthria- f/u with NSU after rehab; no head CT unless has a change in function, per NSU  Cont therapies  Cont follow up with Neurology  Follow up with Neurosurg   2. Proteus bacteremia/Recent aspiration PNA:   Cont Cipro on 9/24 for 120 days  3. Seizure disorder:   Having breakthrough seizures in HD per wife  Follow up with Neurology  4. T2DM with hyperglycemia:   Cont monitor CBGs  5.  ESRD:   Cont recs per Nephro  6. HTN:   Relatively conrolled  Cont meds  7. Spasticity in setting of 2017 CVA with previous R hemiparesis  Relatively stable  8. Gait abnormality  Cont walker for safety  Cont therapies  Meds reviewed Referrals reviewed All questions answered

## 2019-07-12 NOTE — Progress Notes (Signed)
NEUROLOGY FOLLOW UP OFFICE NOTE  Jason Fris[facility] [id number] Jan 20, 1966  HISTORY OF PRESENT ILLNESS: I had the pleasure of seeing [facility] in follow-up in the neurology clinic on 07/12/2019.  The patient was last seen 7 months ago for seizures secondary to stroke. He is again accompanied by his wife who helps supplement the history today.  Records and images were personally reviewed where available. Unfortunately he has had a complicated course since his last visit. He was at [facility] for chemotherapy and autologous stem cell transplant for multiple myeloma in September 2020 and while receiving dialysis, became hypotensive. He was back on the floor alert and oriented when he had a seizure. Brain imaging showed an acute on chronic right subdural hematoma, serial CT scans were stable. EEG showed diffuse slowing. He was then found to have Proteus bacteremia. He became delirious and agitated on 9/20 and refused his seizure medications, briefly on Depakote IV. He was discharged on home doses of Lamotrigine 1357m BID and Lacosamide 1538mBID with clonazepam 0.57m26mrior to hemodialysis and additional Lacosamide dose on HD days. Since then, his wife has called our office several times for recurrence of seizures at the end of dialysis. This had previously been well-controlled with addition of clonazepam in March 2019. He had another seizure yesterday, earlier into his dialysis session. He says he was scratching his left ear and listening to music and could hear the nurse saying he was having a seizure. His wife reports that with the seizure last Friday during dialysis, she was told he was moving his arms and legs and started twitching. He was more alert after, usually he does not recognize her when she arrives. His wife has noticed some staring episodes where he would not follow her instructions. She would tell him is it time to get up, he sits up and stares off, or while walking he would stand and stare off for a  split second. Sometimes she asks him to lift his arm up when bathing and he would not follow. She feels that cognitively he is starting to come back to baseline. He denies any headaches, dizziness, diplopia, dysarthria/dysphagia, focal numbness/tingling. He has residual right-sided weakness and denies any weakness on his left side. He had a fall last Friday in the bathroom when he lost his balance.    History on Initial Assessment 10/29/2017: This is a pleasant 53 44 RH man with a history of diabetes, sickle-cell thalassemia, DVT and PE off Coumadin due to GI bleed, sleep apnea, MGUS, CKD on dialysis, and strokes in multiple vascular distributions in January 2017 with subsequent seizures. Stroke workup showed left M2 superior branch occlusion, TEE with PFO, hypercoagulation studies negative. He is now on aspirin for secondary stroke prevention. He made significant improvement with right-sided weakness and speech was more fluent. He had a seizure in April 2017 and was started on Vimpat 100m30mD. EEG reported diffuse slowing. Limited MRI brain showed multiple late subacute and chronic infarcts, and repeat imaging in May 2017 showed resolution with no new infarct. [place] and was found to be anemic with Hgb of 6.8, requiring multiple blood transfusions. Vimpat increased to 150mg100m. EEG showed mild intermittent generalized theta slowing. He had another seizure after dialysis in November 2017 and Dilantin 300mg 43my was added. His wife did not want him to be on this long term, and was switched to Lamotrigine. He had another seizure in January 2018 a  few days after Dilantin was tapered off. He had another seizure in October 2018 again after dialysis. The last seizure was on 10/19/17 again after dialysis. This was slightly different because he was belligerent and agitated and his wife had to calm him down. He is taking Vimpat 121m BID and Lamotrigine  1521mBID with no side effects. He sometimes has a warning prior to the seizures. With the most recent seizure, he was standing at the kitchen table, recalls his wife picking him up, then coming to on the sofa. Seizures last 5-10 minutes, his wife describes staring off, unresponsive, then trembling/low amplitude shaking. He is tired after, with worsened right-sided weakness. He would be "pretty out of it" the next day. Majority of seizures occur right after dialysis, he would still be sitting in the dialysis unit, but the most recent seizure occurred at home an hour after dialysis. He reports BP is not an issue because his nephrologist has regulated his medications and BP is good most of the time.   He has occasional tremors in both legs, he has a right hand tremor noted in the office today. He noticed memory changes after his stroke, but does not think memory is worse after each seizure. He wonders about his short-term memory, sometimes he has to think which is his right or left side. He denies any olfactory/gustatory hallucinations, deja vu, rising epigastric sensation,myoclonic jerks.   Epilepsy Risk Factors:  Prior strokes in multiple vascular distributions. Otherwise he had a normal birth and early development.  There is no history of febrile convulsions, CNS infections such as meningitis/encephalitis, significant traumatic brain injury, neurosurgical procedures, or family history of seizures.  Diagnostic Data: I personally reviewed MRI brain without contrast done 01/2016 which showed left MCA infarct, right posterior temporal and occipital hemorrhagic infarct, left frontal infarct with remote blood products, remote hemorrhagic infarct in the more superior right frontal lobe, remote nonhemorrhagic lacunar infarcts in the left cerebellum.  Prior AEDs: Dilantin  PAST MEDICAL HISTORY: Past Medical History:  Diagnosis Date   A-fib ([facility]   Anemia    Asthma    Diabetes mellitus without  complication ([facility]   DM type 2 (diabetes mellitus, type 2) ([facility]10/09/2018   ESRD (end stage renal disease) on dialysis ([facility]   Essential hypertension 06/09/2019   GIB (gastrointestinal bleeding)    Recurrent episodes- 09/2014, 09/2015 and 05/2016   Gout    History of recent blood transfusion 10/27/14   2 Units PRBC's   Hyperkalemia 07/2011   Hypertension    Monoclonal gammopathy    Multiple myeloma ([facility])    OSA on CPAP    Pulmonary emboli ([facility]11/2012   bilateral--treated with comadin X 6 months   Pulmonary embolism ([facility]11/2012; 09/27/2014   a. Bilat PE 07/2011 - unclear cause, tx with 6 months Coumadin.;    Seizure disorder ([facility]10/09/2018   Seizures ([facility] [place]   Sepsis ([facility]   Sickle cell-thalassemia disease ([facility]   a. Sickle cell trait.   Sleep apnea    [name]   Stroke ([facility]01/2017   R-MCA, L-MCA, PCA and bilateral cerebellar complicated by DVT/PE   Subdural hematoma (HCEmp[facility]09/2020    MEDICATIONS: Current Outpatient Medications on File Prior to Visit  Medication Sig Dispense Refill   acetaminophen (TYLENOL) 325 MG tablet Take 1-2 tablets (325-650 mg total) by mouth every 4 (four) hours as needed for mild pain.     acyclovir (ZOVIRAX) 400 MG tablet  Take 400 mg by mouth at bedtime.     albuterol (PROAIR HFA) 108 (90 Base) MCG/ACT inhaler Inhale two puffs every 4-6 hours if needed for cough or wheeze 1 Inhaler 1   amLODipine (NORVASC) 2.5 MG tablet Take 1 tablet (2.5 mg total) by mouth at bedtime. 30 tablet 0   calcium carbonate (TUMS - DOSED IN MG ELEMENTAL CALCIUM) 500 MG chewable tablet Chew 2 tablets by mouth at bedtime.     camphor-menthol (SARNA) lotion Apply 1 application topically every 8 (eight) hours as needed for itching. 222 mL 0   Cetirizine HCl 10 MG CAPS Take 1 capsule by mouth daily.     cinacalcet (SENSIPAR) 60 MG tablet Take 60 mg by mouth every evening.     ciprofloxacin (CIPRO) 500 MG tablet Take 500 mg by mouth every  evening. For 120 days     clonazePAM (KLONOPIN) 0.5 MG tablet Take 2 tablets every Monday, Wednesday, and Friday with hemodialysis 30 tablet 5   fluticasone furoate-vilanterol (BREO ELLIPTA) 200-25 MCG/INH AEPB Inhale 1 puff into the lungs daily.     hydrOXYzine (ATARAX/VISTARIL) 25 MG tablet Take 1 tablet (25 mg total) by mouth every 8 (eight) hours as needed for itching. 30 tablet 0   Lacosamide (VIMPAT) 150 MG TABS Take 150 mg by mouth See admin instructions. Take 1 capsule (157m) by mouth twice daily all day except on Mon, Wed, Fri take 1 capsule in the morning, 1 capsule after dialysis, and 1 capsule at bedtime.     lamoTRIgine (LAMICTAL) 150 MG tablet Take 150 mg by mouth 2 (two) times daily.     metoprolol tartrate (LOPRESSOR) 25 MG tablet Take 12.5 mg by mouth 2 (two) times daily.     multivitamin (RENA-VIT) TABS tablet Take 1 tablet by mouth at bedtime.     pantoprazole (PROTONIX) 40 MG tablet Take 40 mg by mouth daily.     prochlorperazine (COMPAZINE) 10 MG tablet TAKE 1 TABLET (10 MG TOTAL) BY MOUTH EVERY 6 (SIX) HOURS AS NEEDED FOR NAUSEA OR VOMITING. 30 tablet 0   No current facility-administered medications on file prior to visit.     ALLERGIES: Allergies  Allergen Reactions   Codeine Rash and Other (See Comments)    Unknown reaction (patient says it was more serious than just a rash, but he can't remember what happened) Unknown reaction (patient says it was more serious than just a rash, but he can't remember what happened)   Codeine Swelling    FAMILY HISTORY: Family History  Problem Relation Age of Onset   Hypertension Mother    Hypertension Father    Stroke Father    Sickle cell anemia Brother    Diabetes Mother    Sickle cell anemia Brother    Diabetes type I Brother    Kidney disease Brother     SOCIAL HISTORY: Social History   Socioeconomic History   Marital status: Married    Spouse name: sylvia   Number of children: 1   Years of  education: college   Highest education level: Not on file  Occupational History   Occupation: [employer]   Occupation: disability  Social NDesigner, fashion/clothingstrain: Not hard at all   Food insecurity    Worry: Never true    Inability: Never true   Transportation needs    Medical: No    Non-medical: No  Tobacco Use   Smoking status: Never Smoker   Smokeless tobacco: Never Used  Substance and Sexual Activity   Alcohol use: Not Currently   Drug use: Never   Sexual activity: Yes    Partners: Female  Lifestyle   Physical activity    Days per week: 0 days    Minutes per session: 0 min   Stress: Not at all  Relationships   Social connections    Talks on phone: Not on file    Gets together: Not on file    Attends religious service: Not on file    Active member of club or organization: Not on file    Attends meetings of clubs or organizations: Not on file    Relationship status: Not on file   Intimate partner violence    Fear of current or ex partner: No    Emotionally abused: No    Physically abused: No    Forced sexual activity: No  Other Topics Concern   Not on file  Social History Narrative   ** Merged History Encounter **       Pt lives in 2 story home with his wife and 1 son Has masters degree in psychology Currently disabled.      REVIEW OF SYSTEMS: Constitutional: No fevers, chills, or sweats, no generalized fatigue, change in appetite Eyes: No visual changes, double vision, eye pain Ear, nose and throat: No hearing loss, ear pain, nasal congestion, sore throat Cardiovascular: No chest pain, palpitations Respiratory:  No shortness of breath at rest or with exertion, wheezes GastrointestinaI: No nausea, vomiting, diarrhea, abdominal pain, fecal incontinence Genitourinary:  No dysuria, urinary retention or frequency Musculoskeletal:  No neck pain, back pain Integumentary: No rash, pruritus, skin lesions Neurological: as  above Psychiatric: No depression, insomnia, anxiety Endocrine: No palpitations, fatigue, diaphoresis, mood swings, change in appetite, change in weight, increased thirst Hematologic/Lymphatic:  No anemia, purpura, petechiae. Allergic/Immunologic: no itchy/runny eyes, nasal congestion, recent allergic reactions, rashes  PHYSICAL EXAM: Vitals:   07/12/19 1131  BP: 135/79  Pulse: 80  SpO2: 96%   General: No acute distress, flat affect but more interactive today Head:  Normocephalic/atraumaticSkin/Extremities: No rash, no edema Neurological Exam: alert and oriented to person, place, and time. No aphasia or dysarthria. Fund of knowledge is appropriate.  Recent and remote memory are intact.  Attention and concentration are normal.    Able to name objects and repeat phrases. Cranial nerves: Pupils equal, round, reactive to light.  Extraocular movements intact with no nystagmus. Visual fields full. No facial asymmetry. Tongue, uvula, palate midline.  Motor: Tone increased on right side. 5/5 on left UE and LE, 4/5 on right UE and LE in pyramidal distribution. Deep tendon reflexes +2 throughout, toes downgoing.  Finger to nose testing intact with slight difficulty on right due to weakness. Gait slow and cautious with walker and gait belt, slightly unsteady, no ataxia.  IMPRESSION: This is a pleasant 53 yo RH man with a history of  diabetes, sickle-cell thalassemia, DVT and PE off Coumadin due to GI bleed, sleep apnea, CKD on dialysis, strokes in multiple vascular distributions in January 2017 with subsequent seizures. He had a complicated course while preparing for stem cell transplant for multiple myeloma, found to have a nontraumatic right subdural hematoma. He had previously been seizure-free for a year until hospitalization in September. Since then, he is back to having seizures during HD, most recently yesterday. This is likely in the setting of new right SDH, repeat head CT will be ordered for interval  follow-up. Increase Vimpat to 257m BID, with additional  233m dose after HD. Continue Lamotrigine 1566mBID and clonazepam 14m34mrior to HD. He does not drive. Continue PT/OT. Follow-up in 3-4 months, they know to call for any changes.   Thank you for allowing me to participate in his care.  Please do not hesitate to call for any questions or concerns.  The duration of this appointment visit was 30 minutes of face-to-face time with the patient.  Greater than 50% of this time was spent in counseling, explanation of diagnosis, planning of further management, and coordination of care.   KarEllouise Newer.D.   CC: Dr. SanBaird Cancer

## 2019-07-13 ENCOUNTER — Other Ambulatory Visit: Payer: Self-pay | Admitting: Nephrology

## 2019-07-13 DIAGNOSIS — D509 Iron deficiency anemia, unspecified: Secondary | ICD-10-CM | POA: Diagnosis not present

## 2019-07-13 DIAGNOSIS — Z992 Dependence on renal dialysis: Secondary | ICD-10-CM | POA: Diagnosis not present

## 2019-07-13 DIAGNOSIS — N2581 Secondary hyperparathyroidism of renal origin: Secondary | ICD-10-CM | POA: Diagnosis not present

## 2019-07-13 DIAGNOSIS — D631 Anemia in chronic kidney disease: Secondary | ICD-10-CM | POA: Diagnosis not present

## 2019-07-13 DIAGNOSIS — E1065 Type 1 diabetes mellitus with hyperglycemia: Secondary | ICD-10-CM | POA: Diagnosis not present

## 2019-07-13 DIAGNOSIS — N186 End stage renal disease: Secondary | ICD-10-CM | POA: Diagnosis not present

## 2019-07-13 DIAGNOSIS — Z23 Encounter for immunization: Secondary | ICD-10-CM | POA: Diagnosis not present

## 2019-07-13 DIAGNOSIS — E1129 Type 2 diabetes mellitus with other diabetic kidney complication: Secondary | ICD-10-CM | POA: Diagnosis not present

## 2019-07-14 ENCOUNTER — Telehealth: Payer: Self-pay

## 2019-07-14 ENCOUNTER — Ambulatory Visit
Admission: RE | Admit: 2019-07-14 | Discharge: 2019-07-14 | Disposition: A | Discharge status: Home / Self Care | Payer: 59 | Source: Ambulatory Visit | Attending: Neurology | Admitting: Neurology

## 2019-07-14 DIAGNOSIS — I120 Hypertensive chronic kidney disease with stage 5 chronic kidney disease or end stage renal disease: Secondary | ICD-10-CM | POA: Diagnosis not present

## 2019-07-14 DIAGNOSIS — I69351 Hemiplegia and hemiparesis following cerebral infarction affecting right dominant side: Secondary | ICD-10-CM | POA: Diagnosis not present

## 2019-07-14 DIAGNOSIS — S065X9A Traumatic subdural hemorrhage with loss of consciousness of unspecified duration, initial encounter: Secondary | ICD-10-CM

## 2019-07-14 DIAGNOSIS — I69320 Aphasia following cerebral infarction: Secondary | ICD-10-CM | POA: Diagnosis not present

## 2019-07-14 DIAGNOSIS — N186 End stage renal disease: Secondary | ICD-10-CM | POA: Diagnosis not present

## 2019-07-14 DIAGNOSIS — I6200 Nontraumatic subdural hemorrhage, unspecified: Secondary | ICD-10-CM | POA: Diagnosis not present

## 2019-07-14 DIAGNOSIS — R569 Unspecified convulsions: Secondary | ICD-10-CM

## 2019-07-14 DIAGNOSIS — C9000 Multiple myeloma not having achieved remission: Secondary | ICD-10-CM | POA: Diagnosis not present

## 2019-07-14 DIAGNOSIS — E1122 Type 2 diabetes mellitus with diabetic chronic kidney disease: Secondary | ICD-10-CM | POA: Diagnosis not present

## 2019-07-14 DIAGNOSIS — S065XAA Traumatic subdural hemorrhage with loss of consciousness status unknown, initial encounter: Secondary | ICD-10-CM

## 2019-07-14 NOTE — Telephone Encounter (Signed)
Isaiah [name] with [facility]'s adult blood and marrow transplant coordinator returning a call about a mutual patient of Dr. Dione Housekeeper to let Dr. Baird Cancer know that it's fine for the pt to go ahead and get a flu vaccination.

## 2019-07-14 NOTE — Telephone Encounter (Signed)
Mrs. Rode was notified that Dr. Baird Cancer said that the pt can come in next week or he can get his flu vaccination at dialysis because the bone and marrow transplant coordinator with [facility] called and left a message that its fine for the pt to receive a flu vaccination.

## 2019-07-14 NOTE — Telephone Encounter (Signed)
Please call [name] and his wife to let them know. He can either come here next week for flu vaccine or get in dialysis.

## 2019-07-15 ENCOUNTER — Telehealth: Payer: Self-pay

## 2019-07-15 DIAGNOSIS — Z992 Dependence on renal dialysis: Secondary | ICD-10-CM | POA: Diagnosis not present

## 2019-07-15 DIAGNOSIS — D509 Iron deficiency anemia, unspecified: Secondary | ICD-10-CM | POA: Diagnosis not present

## 2019-07-15 DIAGNOSIS — E039 Hypothyroidism, unspecified: Secondary | ICD-10-CM | POA: Insufficient documentation

## 2019-07-15 DIAGNOSIS — D631 Anemia in chronic kidney disease: Secondary | ICD-10-CM | POA: Diagnosis not present

## 2019-07-15 DIAGNOSIS — E1129 Type 2 diabetes mellitus with other diabetic kidney complication: Secondary | ICD-10-CM | POA: Diagnosis not present

## 2019-07-15 DIAGNOSIS — N186 End stage renal disease: Secondary | ICD-10-CM | POA: Diagnosis not present

## 2019-07-15 DIAGNOSIS — N2581 Secondary hyperparathyroidism of renal origin: Secondary | ICD-10-CM | POA: Diagnosis not present

## 2019-07-15 DIAGNOSIS — Z23 Encounter for immunization: Secondary | ICD-10-CM | POA: Diagnosis not present

## 2019-07-15 HISTORY — DX: Hypothyroidism, unspecified: E03.9

## 2019-07-15 NOTE — Telephone Encounter (Signed)
-----   Message from Cameron Sprang, MD sent at 07/15/2019 10:00 AM EST ----- Pls let wife know that the bleed is improving, smaller in size. No new changes. As we discussed, as long as there is something in the brain that should not be there, he can have increase in seizures. Hopefully the higher dose of Vimpat and clonazepam are helping. Thanks

## 2019-07-15 NOTE — Telephone Encounter (Signed)
Wife informed of CT results. She would like to ask Dr. Delice Lesch if pt still needs to follow up with Neurosurgery?

## 2019-07-18 ENCOUNTER — Encounter: Payer: Self-pay | Admitting: Neurology

## 2019-07-18 DIAGNOSIS — Z992 Dependence on renal dialysis: Secondary | ICD-10-CM | POA: Diagnosis not present

## 2019-07-18 DIAGNOSIS — D509 Iron deficiency anemia, unspecified: Secondary | ICD-10-CM | POA: Diagnosis not present

## 2019-07-18 DIAGNOSIS — N2581 Secondary hyperparathyroidism of renal origin: Secondary | ICD-10-CM | POA: Diagnosis not present

## 2019-07-18 DIAGNOSIS — N186 End stage renal disease: Secondary | ICD-10-CM | POA: Diagnosis not present

## 2019-07-18 DIAGNOSIS — E1129 Type 2 diabetes mellitus with other diabetic kidney complication: Secondary | ICD-10-CM | POA: Diagnosis not present

## 2019-07-18 DIAGNOSIS — D631 Anemia in chronic kidney disease: Secondary | ICD-10-CM | POA: Diagnosis not present

## 2019-07-18 DIAGNOSIS — Z23 Encounter for immunization: Secondary | ICD-10-CM | POA: Diagnosis not present

## 2019-07-19 DIAGNOSIS — E1122 Type 2 diabetes mellitus with diabetic chronic kidney disease: Secondary | ICD-10-CM | POA: Diagnosis not present

## 2019-07-19 DIAGNOSIS — I69320 Aphasia following cerebral infarction: Secondary | ICD-10-CM | POA: Diagnosis not present

## 2019-07-19 DIAGNOSIS — C9000 Multiple myeloma not having achieved remission: Secondary | ICD-10-CM | POA: Diagnosis not present

## 2019-07-19 DIAGNOSIS — I120 Hypertensive chronic kidney disease with stage 5 chronic kidney disease or end stage renal disease: Secondary | ICD-10-CM | POA: Diagnosis not present

## 2019-07-19 DIAGNOSIS — I69351 Hemiplegia and hemiparesis following cerebral infarction affecting right dominant side: Secondary | ICD-10-CM | POA: Diagnosis not present

## 2019-07-19 DIAGNOSIS — N186 End stage renal disease: Secondary | ICD-10-CM | POA: Diagnosis not present

## 2019-07-19 MED FILL — SM ALCOHOL 70% PREP PADS: 70 | 33 days supply | Qty: 100 | Fill #0

## 2019-07-20 ENCOUNTER — Telehealth: Payer: Self-pay

## 2019-07-20 DIAGNOSIS — D631 Anemia in chronic kidney disease: Secondary | ICD-10-CM | POA: Diagnosis not present

## 2019-07-20 DIAGNOSIS — Z992 Dependence on renal dialysis: Secondary | ICD-10-CM | POA: Diagnosis not present

## 2019-07-20 DIAGNOSIS — N186 End stage renal disease: Secondary | ICD-10-CM | POA: Diagnosis not present

## 2019-07-20 DIAGNOSIS — D509 Iron deficiency anemia, unspecified: Secondary | ICD-10-CM | POA: Diagnosis not present

## 2019-07-20 DIAGNOSIS — E1129 Type 2 diabetes mellitus with other diabetic kidney complication: Secondary | ICD-10-CM | POA: Diagnosis not present

## 2019-07-20 DIAGNOSIS — Z23 Encounter for immunization: Secondary | ICD-10-CM | POA: Diagnosis not present

## 2019-07-20 DIAGNOSIS — E039 Hypothyroidism, unspecified: Secondary | ICD-10-CM | POA: Diagnosis not present

## 2019-07-20 DIAGNOSIS — N2581 Secondary hyperparathyroidism of renal origin: Secondary | ICD-10-CM | POA: Diagnosis not present

## 2019-07-20 NOTE — Telephone Encounter (Signed)
[name] called back and said that the pt does have an albuteral inhaler and that he doesn't have any wheezing.

## 2019-07-20 NOTE — Telephone Encounter (Signed)
I left Mrs. Degracia a message that Dr. Baird Cancer wanted to know if the pt has an albuterol inhaler and if the pt has been having any wheezing.

## 2019-07-21 ENCOUNTER — Ambulatory Visit: Payer: Self-pay

## 2019-07-21 DIAGNOSIS — E1122 Type 2 diabetes mellitus with diabetic chronic kidney disease: Secondary | ICD-10-CM

## 2019-07-21 DIAGNOSIS — I120 Hypertensive chronic kidney disease with stage 5 chronic kidney disease or end stage renal disease: Secondary | ICD-10-CM | POA: Diagnosis not present

## 2019-07-21 DIAGNOSIS — C9000 Multiple myeloma not having achieved remission: Secondary | ICD-10-CM | POA: Diagnosis not present

## 2019-07-21 DIAGNOSIS — I69351 Hemiplegia and hemiparesis following cerebral infarction affecting right dominant side: Secondary | ICD-10-CM | POA: Diagnosis not present

## 2019-07-21 DIAGNOSIS — N186 End stage renal disease: Secondary | ICD-10-CM

## 2019-07-21 DIAGNOSIS — I69320 Aphasia following cerebral infarction: Secondary | ICD-10-CM | POA: Diagnosis not present

## 2019-07-21 MED FILL — FREESTYLE LITE TEST STRIP: 90 days supply | Qty: 300 | Fill #1

## 2019-07-21 MED FILL — FREESTYLE LANCETS: 90 days supply | Qty: 300 | Fill #0

## 2019-07-21 NOTE — Chronic Care Management (AMB) (Signed)
Care Management Note   HEBERT DOOLING is a 53 y.o. year old male who is a primary care patient of [name], MD . The CM team was consulted for assistance with care coordination.   Review of patient status, including review of consultants reports, rand collaboration with appropriate care team members and the patient's provider was performed as part of comprehensive patient evaluation and provision of care management services. Telephone outreach to patient today to introduce CM services.   SW placed an outbound call to the patient in response to recent referral placed by Dr. [name]. SW spoke with the patients wife [name] whom is able to confirm HIPAA identifiers.  Outpatient Encounter Medications as of 07/21/2019  Medication Sig Note  . acetaminophen (TYLENOL) 325 MG tablet Take 1-2 tablets (325-650 mg total) by mouth every 4 (four) hours as needed for mild pain.   [name] acyclovir (ZOVIRAX) 400 MG tablet Take 400 mg by mouth at bedtime.   [name] albuterol (PROAIR HFA) 108 (90 Base) MCG/ACT inhaler Inhale two puffs every 4-6 hours if needed for cough or wheeze   . amLODipine (NORVASC) 2.5 MG tablet Take 1 tablet (2.5 mg total) by mouth at bedtime. (Patient taking differently: Take 2.5 mg by mouth at bedtime. ON HOLD PER WIFE)   . calcium carbonate (TUMS - DOSED IN MG ELEMENTAL CALCIUM) 500 MG chewable tablet Chew 2 tablets by mouth at bedtime.   . camphor-menthol (SARNA) lotion Apply 1 application topically every 8 (eight) hours as needed for itching.   . Cetirizine HCl 10 MG CAPS Take 1 capsule by mouth daily.   . cinacalcet (SENSIPAR) 60 MG tablet Take 60 mg by mouth every evening. ON HOLD FOR DIALYSIS PER WIFE   . ciprofloxacin (CIPRO) 500 MG tablet Take 500 mg by mouth every evening. For 120 days   . clonazePAM (KLONOPIN) 0.5 MG tablet Take 2 tablets every Monday, Wednesday, and Friday with hemodialysis   . fluticasone furoate-vilanterol (BREO ELLIPTA) 200-25 MCG/INH AEPB Inhale 1  puff into the lungs daily. 06/09/2019: Pt will supply inhaler  . hydrOXYzine (ATARAX/VISTARIL) 25 MG tablet Take 1 tablet (25 mg total) by mouth every 8 (eight) hours as needed for itching.   . lacosamide (VIMPAT) 200 MG TABS tablet Take 1 tablet twice a day except on dialysis days M-W-F give 1 tab in AM, 1 tab after dialysis, 1 tab in PM.   . lamoTRIgine (LAMICTAL) 150 MG tablet Take 1 tablet (150 mg total) by mouth 2 (two) times daily.   . metoprolol tartrate (LOPRESSOR) 25 MG tablet Take 12.5 mg by mouth 2 (two) times daily.   . multivitamin (RENA-VIT) TABS tablet Take 1 tablet by mouth at bedtime.   . pantoprazole (PROTONIX) 40 MG tablet Take 40 mg by mouth daily.   . prochlorperazine (COMPAZINE) 10 MG tablet TAKE 1 TABLET (10 MG TOTAL) BY MOUTH EVERY 6 (SIX) HOURS AS NEEDED FOR NAUSEA OR VOMITING.    No facility-administered encounter medications on file as of 07/21/2019.     I reached out to [facility] by phone today.   Mr. Brabson was given information about Chronic Care Management services today including:  1. CCM service includes personalized support from designated clinical staff supervised by his physician, including individualized plan of care and coordination with other care providers 2. 24/7 contact phone numbers for assistance for urgent and routine care needs. 3. Service will only be billed when office clinical staff spend 20 minutes or more in a  month to coordinate care. 4. Only one practitioner may furnish and bill the service in a calendar month. 5. The patient may stop CCM services at any time (effective at the end of the month) by phone call to the office staff. 6. The patient will be responsible for cost sharing (co-pay) of up to 20% of the service fee (after annual deductible is met).   Patient did not agree to enrollment in care management services and does not wish to consider at this time.   Follow Up Plan: No further follow up planned at this time. SW provided  contact number for self referral if desired in the future.   [name], BSW, CDP Social Worker, Certified Dementia Practitioner [place] [facility] [place] [facility] [phone]

## 2019-07-22 DIAGNOSIS — Z23 Encounter for immunization: Secondary | ICD-10-CM | POA: Diagnosis not present

## 2019-07-22 DIAGNOSIS — Z992 Dependence on renal dialysis: Secondary | ICD-10-CM | POA: Diagnosis not present

## 2019-07-22 DIAGNOSIS — D509 Iron deficiency anemia, unspecified: Secondary | ICD-10-CM | POA: Diagnosis not present

## 2019-07-22 DIAGNOSIS — N2581 Secondary hyperparathyroidism of renal origin: Secondary | ICD-10-CM | POA: Diagnosis not present

## 2019-07-22 DIAGNOSIS — D631 Anemia in chronic kidney disease: Secondary | ICD-10-CM | POA: Diagnosis not present

## 2019-07-22 DIAGNOSIS — E1129 Type 2 diabetes mellitus with other diabetic kidney complication: Secondary | ICD-10-CM | POA: Diagnosis not present

## 2019-07-22 DIAGNOSIS — N186 End stage renal disease: Secondary | ICD-10-CM | POA: Diagnosis not present

## 2019-07-25 ENCOUNTER — Telehealth: Payer: Self-pay

## 2019-07-25 DIAGNOSIS — N186 End stage renal disease: Secondary | ICD-10-CM | POA: Diagnosis not present

## 2019-07-25 DIAGNOSIS — Z23 Encounter for immunization: Secondary | ICD-10-CM | POA: Diagnosis not present

## 2019-07-25 DIAGNOSIS — D631 Anemia in chronic kidney disease: Secondary | ICD-10-CM | POA: Diagnosis not present

## 2019-07-25 DIAGNOSIS — E1129 Type 2 diabetes mellitus with other diabetic kidney complication: Secondary | ICD-10-CM | POA: Diagnosis not present

## 2019-07-25 DIAGNOSIS — N2581 Secondary hyperparathyroidism of renal origin: Secondary | ICD-10-CM | POA: Diagnosis not present

## 2019-07-25 DIAGNOSIS — Z992 Dependence on renal dialysis: Secondary | ICD-10-CM | POA: Diagnosis not present

## 2019-07-25 DIAGNOSIS — D509 Iron deficiency anemia, unspecified: Secondary | ICD-10-CM | POA: Diagnosis not present

## 2019-07-25 NOTE — Telephone Encounter (Signed)
Wife would like to know if pt still needs to follow up with Neurosurgeon?

## 2019-07-25 NOTE — Telephone Encounter (Signed)
Since the bleed is not enlarging, can hold off for now. Thanks

## 2019-07-25 NOTE — Telephone Encounter (Signed)
Wife informed. She states that's that pt has done will with increase in seizure medication.

## 2019-07-26 ENCOUNTER — Other Ambulatory Visit: Payer: Self-pay | Admitting: Nephrology

## 2019-07-26 DIAGNOSIS — E1122 Type 2 diabetes mellitus with diabetic chronic kidney disease: Secondary | ICD-10-CM | POA: Diagnosis not present

## 2019-07-26 DIAGNOSIS — I69351 Hemiplegia and hemiparesis following cerebral infarction affecting right dominant side: Secondary | ICD-10-CM | POA: Diagnosis not present

## 2019-07-26 DIAGNOSIS — C9000 Multiple myeloma not having achieved remission: Secondary | ICD-10-CM | POA: Diagnosis not present

## 2019-07-26 DIAGNOSIS — I120 Hypertensive chronic kidney disease with stage 5 chronic kidney disease or end stage renal disease: Secondary | ICD-10-CM | POA: Diagnosis not present

## 2019-07-26 DIAGNOSIS — I69320 Aphasia following cerebral infarction: Secondary | ICD-10-CM | POA: Diagnosis not present

## 2019-07-26 DIAGNOSIS — N186 End stage renal disease: Secondary | ICD-10-CM | POA: Diagnosis not present

## 2019-07-26 DIAGNOSIS — N200 Calculus of kidney: Secondary | ICD-10-CM

## 2019-07-27 ENCOUNTER — Encounter: Payer: Self-pay | Admitting: Internal Medicine

## 2019-07-27 ENCOUNTER — Telehealth: Payer: Self-pay

## 2019-07-27 DIAGNOSIS — N186 End stage renal disease: Secondary | ICD-10-CM | POA: Diagnosis not present

## 2019-07-27 DIAGNOSIS — Z992 Dependence on renal dialysis: Secondary | ICD-10-CM | POA: Diagnosis not present

## 2019-07-27 DIAGNOSIS — E1129 Type 2 diabetes mellitus with other diabetic kidney complication: Secondary | ICD-10-CM | POA: Diagnosis not present

## 2019-07-27 DIAGNOSIS — N2581 Secondary hyperparathyroidism of renal origin: Secondary | ICD-10-CM | POA: Diagnosis not present

## 2019-07-27 DIAGNOSIS — D509 Iron deficiency anemia, unspecified: Secondary | ICD-10-CM | POA: Diagnosis not present

## 2019-07-27 DIAGNOSIS — Z23 Encounter for immunization: Secondary | ICD-10-CM | POA: Diagnosis not present

## 2019-07-27 DIAGNOSIS — D631 Anemia in chronic kidney disease: Secondary | ICD-10-CM | POA: Diagnosis not present

## 2019-07-27 NOTE — Telephone Encounter (Signed)
-----   Message from [name], MD sent at 07/27/2019  4:29 PM EST ----- Hmm. I do not think I have seen dialysis labs. It depends regarding Bp meds - what are his BP running during dialysis?  ----- Message ----- From: Isaiah [name] Sent: 07/27/2019   2:49 PM EST To: [name], MD  Pt wife would like to know if his blood pressure is low on the days of dialysis should she give his bp meds. She knows to hold on the day of if its low. And she wants to know if you have seen his labs from dialysis that you asked to be drawn

## 2019-07-27 NOTE — Telephone Encounter (Signed)
Called pt left vm for pt wife to send bp log on mychart when she getss a chance

## 2019-07-28 ENCOUNTER — Telehealth: Payer: Self-pay

## 2019-07-28 DIAGNOSIS — I120 Hypertensive chronic kidney disease with stage 5 chronic kidney disease or end stage renal disease: Secondary | ICD-10-CM | POA: Diagnosis not present

## 2019-07-28 DIAGNOSIS — I69320 Aphasia following cerebral infarction: Secondary | ICD-10-CM | POA: Diagnosis not present

## 2019-07-28 DIAGNOSIS — N186 End stage renal disease: Secondary | ICD-10-CM | POA: Diagnosis not present

## 2019-07-28 DIAGNOSIS — E1122 Type 2 diabetes mellitus with diabetic chronic kidney disease: Secondary | ICD-10-CM | POA: Diagnosis not present

## 2019-07-28 DIAGNOSIS — I69351 Hemiplegia and hemiparesis following cerebral infarction affecting right dominant side: Secondary | ICD-10-CM | POA: Diagnosis not present

## 2019-07-28 DIAGNOSIS — C9000 Multiple myeloma not having achieved remission: Secondary | ICD-10-CM | POA: Diagnosis not present

## 2019-07-28 NOTE — Telephone Encounter (Signed)
Isaiah [name] wants to know if she is to still send the pt's blood pressure readings and should she give him his blood pressure medication daily.

## 2019-07-28 NOTE — Telephone Encounter (Signed)
I responded to her already. Thanks. I would like for her to continue with BP log. She should also discuss with kidney doctor if he continues to have low readings.

## 2019-07-28 NOTE — Telephone Encounter (Signed)
-----   Message from [name], MD sent at 07/28/2019  5:12 PM EST ----- Yes I have thank you.   RS ----- Message ----- From: Michelle Nasuti, CMA Sent: 07/28/2019   4:49 PM EST To: [name], MD  Mrs. Madariaga left a voicemail that she sent a message yesterday in MyChart with the pt's bp readings and she wanted to see if Dr. Baird Cancer got lab results from the dialysis ctr.

## 2019-07-29 DIAGNOSIS — D631 Anemia in chronic kidney disease: Secondary | ICD-10-CM | POA: Diagnosis not present

## 2019-07-29 DIAGNOSIS — Z23 Encounter for immunization: Secondary | ICD-10-CM | POA: Diagnosis not present

## 2019-07-29 DIAGNOSIS — D509 Iron deficiency anemia, unspecified: Secondary | ICD-10-CM | POA: Diagnosis not present

## 2019-07-29 DIAGNOSIS — E1129 Type 2 diabetes mellitus with other diabetic kidney complication: Secondary | ICD-10-CM | POA: Diagnosis not present

## 2019-07-29 DIAGNOSIS — N2581 Secondary hyperparathyroidism of renal origin: Secondary | ICD-10-CM | POA: Diagnosis not present

## 2019-07-29 DIAGNOSIS — N186 End stage renal disease: Secondary | ICD-10-CM | POA: Diagnosis not present

## 2019-07-29 DIAGNOSIS — Z992 Dependence on renal dialysis: Secondary | ICD-10-CM | POA: Diagnosis not present

## 2019-07-31 DIAGNOSIS — N186 End stage renal disease: Secondary | ICD-10-CM | POA: Diagnosis not present

## 2019-07-31 DIAGNOSIS — E1129 Type 2 diabetes mellitus with other diabetic kidney complication: Secondary | ICD-10-CM | POA: Diagnosis not present

## 2019-07-31 DIAGNOSIS — Z23 Encounter for immunization: Secondary | ICD-10-CM | POA: Diagnosis not present

## 2019-07-31 DIAGNOSIS — D509 Iron deficiency anemia, unspecified: Secondary | ICD-10-CM | POA: Diagnosis not present

## 2019-07-31 DIAGNOSIS — N2581 Secondary hyperparathyroidism of renal origin: Secondary | ICD-10-CM | POA: Diagnosis not present

## 2019-07-31 DIAGNOSIS — Z992 Dependence on renal dialysis: Secondary | ICD-10-CM | POA: Diagnosis not present

## 2019-07-31 DIAGNOSIS — D631 Anemia in chronic kidney disease: Secondary | ICD-10-CM | POA: Diagnosis not present

## 2019-08-01 ENCOUNTER — Encounter: Payer: Self-pay | Admitting: Internal Medicine

## 2019-08-01 DIAGNOSIS — C9000 Multiple myeloma not having achieved remission: Secondary | ICD-10-CM | POA: Diagnosis not present

## 2019-08-01 DIAGNOSIS — I69351 Hemiplegia and hemiparesis following cerebral infarction affecting right dominant side: Secondary | ICD-10-CM | POA: Diagnosis not present

## 2019-08-01 DIAGNOSIS — E1122 Type 2 diabetes mellitus with diabetic chronic kidney disease: Secondary | ICD-10-CM | POA: Diagnosis not present

## 2019-08-01 DIAGNOSIS — I69320 Aphasia following cerebral infarction: Secondary | ICD-10-CM | POA: Diagnosis not present

## 2019-08-01 DIAGNOSIS — I120 Hypertensive chronic kidney disease with stage 5 chronic kidney disease or end stage renal disease: Secondary | ICD-10-CM | POA: Diagnosis not present

## 2019-08-01 DIAGNOSIS — N186 End stage renal disease: Secondary | ICD-10-CM | POA: Diagnosis not present

## 2019-08-01 NOTE — Progress Notes (Signed)
(  Key: [id number])  Rx #: [id number]  Vimpat 200MG tablets  prior authorization submitted via covermymeds.  Waiting response.

## 2019-08-02 ENCOUNTER — Telehealth: Payer: Self-pay

## 2019-08-02 DIAGNOSIS — D509 Iron deficiency anemia, unspecified: Secondary | ICD-10-CM | POA: Diagnosis not present

## 2019-08-02 DIAGNOSIS — N186 End stage renal disease: Secondary | ICD-10-CM | POA: Diagnosis not present

## 2019-08-02 DIAGNOSIS — N2581 Secondary hyperparathyroidism of renal origin: Secondary | ICD-10-CM | POA: Diagnosis not present

## 2019-08-02 DIAGNOSIS — D631 Anemia in chronic kidney disease: Secondary | ICD-10-CM | POA: Diagnosis not present

## 2019-08-02 DIAGNOSIS — E1129 Type 2 diabetes mellitus with other diabetic kidney complication: Secondary | ICD-10-CM | POA: Diagnosis not present

## 2019-08-02 DIAGNOSIS — Z992 Dependence on renal dialysis: Secondary | ICD-10-CM | POA: Diagnosis not present

## 2019-08-02 DIAGNOSIS — Z23 Encounter for immunization: Secondary | ICD-10-CM | POA: Diagnosis not present

## 2019-08-02 NOTE — Telephone Encounter (Signed)
I left a message for the pt's wife to call the office back.   [name] wanted to know if she should still do the BP log for the pt and she wanted to know if Dr.Sanders got the information that was faxed over to the office and she wanted to know the pt's results. I left her a message that I checked with the ladies up front and they said that the information came and that they gave the information to Dr. Baird Cancer and that she would get a call or email with results. And I also needed to tell her that Dr. Baird Cancer said to continue with the BP log and to discuss with the pt's kidney doctor if he continues to have low readings.

## 2019-08-03 ENCOUNTER — Telehealth: Payer: Self-pay

## 2019-08-03 ENCOUNTER — Encounter: Payer: Self-pay | Admitting: Internal Medicine

## 2019-08-03 DIAGNOSIS — N186 End stage renal disease: Secondary | ICD-10-CM | POA: Diagnosis not present

## 2019-08-03 DIAGNOSIS — I120 Hypertensive chronic kidney disease with stage 5 chronic kidney disease or end stage renal disease: Secondary | ICD-10-CM | POA: Diagnosis not present

## 2019-08-03 DIAGNOSIS — I69320 Aphasia following cerebral infarction: Secondary | ICD-10-CM | POA: Diagnosis not present

## 2019-08-03 DIAGNOSIS — E1122 Type 2 diabetes mellitus with diabetic chronic kidney disease: Secondary | ICD-10-CM | POA: Diagnosis not present

## 2019-08-03 DIAGNOSIS — C9000 Multiple myeloma not having achieved remission: Secondary | ICD-10-CM | POA: Diagnosis not present

## 2019-08-03 DIAGNOSIS — I69351 Hemiplegia and hemiparesis following cerebral infarction affecting right dominant side: Secondary | ICD-10-CM | POA: Diagnosis not present

## 2019-08-03 NOTE — Telephone Encounter (Signed)
Mrs Washington was asked the pt's fluid restriction and she said 2 milliliters per day, she was also asked if he is dehydrated on days his blood pressure is low and she said no because the pt constantly eats ice and he drinks his allowed fluid intake.  [name] said that the pt's bp this morning at 7am was 104/61 and his heart rate was 72.  Mrs. Amano said that she wanted to know if the pt's thyroid results were looked at because she has been worried about his thyroid.

## 2019-08-05 DIAGNOSIS — D509 Iron deficiency anemia, unspecified: Secondary | ICD-10-CM | POA: Diagnosis not present

## 2019-08-05 DIAGNOSIS — Z23 Encounter for immunization: Secondary | ICD-10-CM | POA: Diagnosis not present

## 2019-08-05 DIAGNOSIS — E1129 Type 2 diabetes mellitus with other diabetic kidney complication: Secondary | ICD-10-CM | POA: Diagnosis not present

## 2019-08-05 DIAGNOSIS — D631 Anemia in chronic kidney disease: Secondary | ICD-10-CM | POA: Diagnosis not present

## 2019-08-05 DIAGNOSIS — Z992 Dependence on renal dialysis: Secondary | ICD-10-CM | POA: Diagnosis not present

## 2019-08-05 DIAGNOSIS — N186 End stage renal disease: Secondary | ICD-10-CM | POA: Diagnosis not present

## 2019-08-05 DIAGNOSIS — N2581 Secondary hyperparathyroidism of renal origin: Secondary | ICD-10-CM | POA: Diagnosis not present

## 2019-08-05 NOTE — Telephone Encounter (Signed)
What is her concern regarding his thyroid? Can you please find his recent lab results (may be scanned) and see if TSH is listed. Thanks.

## 2019-08-06 DIAGNOSIS — I69320 Aphasia following cerebral infarction: Secondary | ICD-10-CM | POA: Diagnosis not present

## 2019-08-06 DIAGNOSIS — D509 Iron deficiency anemia, unspecified: Secondary | ICD-10-CM | POA: Diagnosis not present

## 2019-08-06 DIAGNOSIS — G4730 Sleep apnea, unspecified: Secondary | ICD-10-CM | POA: Diagnosis not present

## 2019-08-06 DIAGNOSIS — N186 End stage renal disease: Secondary | ICD-10-CM | POA: Diagnosis not present

## 2019-08-06 DIAGNOSIS — C9000 Multiple myeloma not having achieved remission: Secondary | ICD-10-CM | POA: Diagnosis not present

## 2019-08-06 DIAGNOSIS — M1030 Gout due to renal impairment, unspecified site: Secondary | ICD-10-CM | POA: Diagnosis not present

## 2019-08-06 DIAGNOSIS — I480 Paroxysmal atrial fibrillation: Secondary | ICD-10-CM | POA: Diagnosis not present

## 2019-08-06 DIAGNOSIS — Z8701 Personal history of pneumonia (recurrent): Secondary | ICD-10-CM | POA: Diagnosis not present

## 2019-08-06 DIAGNOSIS — E1140 Type 2 diabetes mellitus with diabetic neuropathy, unspecified: Secondary | ICD-10-CM | POA: Diagnosis not present

## 2019-08-06 DIAGNOSIS — E1122 Type 2 diabetes mellitus with diabetic chronic kidney disease: Secondary | ICD-10-CM | POA: Diagnosis not present

## 2019-08-06 DIAGNOSIS — I120 Hypertensive chronic kidney disease with stage 5 chronic kidney disease or end stage renal disease: Secondary | ICD-10-CM | POA: Diagnosis not present

## 2019-08-06 DIAGNOSIS — Z86711 Personal history of pulmonary embolism: Secondary | ICD-10-CM | POA: Diagnosis not present

## 2019-08-06 DIAGNOSIS — I69351 Hemiplegia and hemiparesis following cerebral infarction affecting right dominant side: Secondary | ICD-10-CM | POA: Diagnosis not present

## 2019-08-07 DIAGNOSIS — E1122 Type 2 diabetes mellitus with diabetic chronic kidney disease: Secondary | ICD-10-CM | POA: Insufficient documentation

## 2019-08-07 DIAGNOSIS — I129 Hypertensive chronic kidney disease with stage 1 through stage 4 chronic kidney disease, or unspecified chronic kidney disease: Secondary | ICD-10-CM | POA: Insufficient documentation

## 2019-08-07 DIAGNOSIS — N186 End stage renal disease: Secondary | ICD-10-CM | POA: Insufficient documentation

## 2019-08-07 NOTE — Progress Notes (Signed)
Subjective:     Patient ID: Isaiah [name] , male    DOB: [birth year] , 53 y.o.   MRN: [id number]   Chief Complaint  Patient presents with  . Hospitalization Follow-up    HPI  He presents today for hospital f/u.  He is accompanied by his wife, who supplements the history.  Isaiah [name] is a 53 y.o. male with history of T2DM, A fib, ESRD-HD MWF, CVA, DVT/PE s/p IVC filter, seizure disorder, MM with recent admission to [name] for stem cell transplant but procedure aborted due to findings of proteus mirabilis bacteremia, aspiration PNA, acute on chronic SDH, findings of bilateral renal calculi as well as breakthrough seizures. He was discharged to home 06/07/19 on Cipro but continued to have progressive weakness with poor po intake developed slurred speech with increased in right sided weakness and inability to walk. He was admitted on 06/09/19 with work up and CT head revealed 1.5 cn acute on chronic SDH with 74m midline shift. MRI brain showed acute on chronic SDH without acute infarct.  Dr. EEllene Routerecommended conservative care with monitoring for neurological changes. Dr. KLeonel Ramsayfelt that mild worsening of right sided weakness was due to physiological stress of N/V with poor intake. Blood cultures X 2 was negative. Follow up CT head 10/4 was stable and mentation as well as activity tolerance was improving. Therapy initiated and CIR recommended due to functional decline.  He was then transferred to Rehab on 10/6 and then discharged to home on 06/30/19.  Unfortunately, he is still having seizures during dialysis.     Past Medical History:  Diagnosis Date  . A-fib ([facility]   . Anemia   . Asthma   . Diabetes mellitus without complication ([facility]   . DM type 2 (diabetes mellitus, type 2) ([facility] 06/09/2019  . ESRD (end stage renal disease) on dialysis ([facility]   . Essential hypertension 06/09/2019  . GIB (gastrointestinal bleeding)    Recurrent episodes- 09/2014, 09/2015 and 05/2016  . Gout   . History  of recent blood transfusion 10/27/14   2 Units PRBC's  . Hyperkalemia 07/2011  . Hypertension   . Monoclonal gammopathy   . Multiple myeloma ([facility] [place]   . OSA on CPAP   . Pulmonary emboli ([facility] 07/2011   bilateral--treated with comadin X 6 months  . Pulmonary embolism ([facility] 07/2011; 09/27/2014   a. Bilat PE 07/2011 - unclear cause, tx with 6 months Coumadin.;   . Seizure disorder ([facility] 06/09/2019  . Seizures ([facility]   . Sepsis ([facility]   . Sickle cell-thalassemia disease ([facility]    a. Sickle cell trait.  . Sleep apnea   . Stroke ([facility]   . Stroke ([facility] 09/2015   R-MCA, L-MCA, PCA and bilateral cerebellar complicated by DVT/PE  . Subdural hematoma ([facility] 05/2019     Family History  Problem Relation Age of Onset  . Hypertension Mother   . Hypertension Father   . Stroke Father   . Sickle cell anemia Brother   . Diabetes Mother   . Sickle cell anemia Brother   . Diabetes type I Brother   . Kidney disease Brother      Current Outpatient Medications:  .  acetaminophen (TYLENOL) 325 MG tablet, Take 1-2 tablets (325-650 mg total) by mouth every 4 (four) hours as needed for mild pain., Disp:  , Rfl:  .  acyclovir (ZOVIRAX) 400 MG tablet, Take 400 mg by mouth at bedtime., Disp: , Rfl:  .  albuterol (PROAIR HFA) 108 (  90 Base) MCG/ACT inhaler, Inhale two puffs every 4-6 hours if needed for cough or wheeze, Disp: 1 Inhaler, Rfl: 1 .  amLODipine (NORVASC) 2.5 MG tablet, Take 1 tablet (2.5 mg total) by mouth at bedtime. (Patient taking differently: Take 2.5 mg by mouth at bedtime. ON HOLD PER WIFE), Disp: 30 tablet, Rfl: 0 .  calcium carbonate (TUMS - DOSED IN MG ELEMENTAL CALCIUM) 500 MG chewable tablet, Chew 2 tablets by mouth at bedtime., Disp: , Rfl:  .  camphor-menthol (SARNA) lotion, Apply 1 application topically every 8 (eight) hours as needed for itching., Disp: 222 mL, Rfl: 0 .  Cetirizine HCl 10 MG CAPS, Take 1 capsule by mouth daily., Disp: , Rfl:  .  cinacalcet (SENSIPAR) 60 MG tablet, Take  60 mg by mouth every evening. ON HOLD FOR DIALYSIS PER WIFE, Disp: , Rfl:  .  clonazePAM (KLONOPIN) 0.5 MG tablet, Take 2 tablets every Monday, Wednesday, and Friday with hemodialysis, Disp: 30 tablet, Rfl: 5 .  fluticasone furoate-vilanterol (BREO ELLIPTA) 200-25 MCG/INH AEPB, Inhale 1 puff into the lungs daily., Disp: , Rfl:  .  hydrOXYzine (ATARAX/VISTARIL) 25 MG tablet, Take 1 tablet (25 mg total) by mouth every 8 (eight) hours as needed for itching., Disp: 30 tablet, Rfl: 0 .  metoprolol tartrate (LOPRESSOR) 25 MG tablet, Take 12.5 mg by mouth 2 (two) times daily., Disp: , Rfl:  .  multivitamin (RENA-VIT) TABS tablet, Take 1 tablet by mouth at bedtime., Disp: , Rfl:  .  pantoprazole (PROTONIX) 40 MG tablet, Take 40 mg by mouth daily., Disp: , Rfl:  .  prochlorperazine (COMPAZINE) 10 MG tablet, TAKE 1 TABLET (10 MG TOTAL) BY MOUTH EVERY 6 (SIX) HOURS AS NEEDED FOR NAUSEA OR VOMITING., Disp: 30 tablet, Rfl: 0 .  ciprofloxacin (CIPRO) 500 MG tablet, Take 500 mg by mouth every evening. For 120 days, Disp: , Rfl:  .  lacosamide (VIMPAT) 200 MG TABS tablet, Take 1 tablet twice a day except on dialysis days M-W-F give 1 tab in AM, 1 tab after dialysis, 1 tab in PM., Disp: 90 tablet, Rfl: 5 .  lamoTRIgine (LAMICTAL) 150 MG tablet, Take 1 tablet (150 mg total) by mouth 2 (two) times daily., Disp: 180 tablet, Rfl: 3   Allergies  Allergen Reactions  . Codeine Rash and Other (See Comments)    Unknown reaction (patient says it was more serious than just a rash, but he can't remember what happened) Unknown reaction (patient says it was more serious than just a rash, but he can't remember what happened)  . Codeine Swelling     Review of Systems  Constitutional: Negative.   Respiratory: Negative.   Cardiovascular: Negative.   Gastrointestinal: Negative.   Neurological: Positive for tremors.  Psychiatric/Behavioral: Negative.      Today's Vitals   07/11/19 1617  BP: 108/62  Pulse: 63  Temp: 97.7  F (36.5 C)  TempSrc: Oral  Weight: 180 lb (81.6 kg)  Height: 6' (1.829 m)   Body mass index is 24.41 kg/m.   Objective:  Physical Exam Vitals signs and nursing note reviewed.  Constitutional:      Appearance: Normal appearance.  Cardiovascular:     Rate and Rhythm: Normal rate and regular rhythm.     Heart sounds: Normal heart sounds.  Pulmonary:     Effort: Pulmonary effort is normal.     Breath sounds: Normal breath sounds.  Skin:    General: Skin is warm.  Neurological:     Mental Status:  He is alert.  Psychiatric:        Mood and Affect: Mood normal.         Assessment And Plan:     1. SDH (subdural hematoma) (HCC)  DISCHARGE SUMMARY WAS REVIEWED IN FULL DETAIL DURING THE VISIT. MEDS RECONCILED AND COMPARED TO DISCHARGE MEDS. MEDICATION LIST WAS UPDATED AND REVIEWED WITH THE PATIENT. GREATER THAN 50% FACE TO FACE TIME WAS SPENT IN COUNSELING REGARDING MEDICAL DIAGNOSES, TREATMENT PLAN  AND COORDINATION OF CARE. ALL QUESTIONS WERE ANSWERED TO THE SATISFACTION OF THE PATIENT AND HIS WIFE.    2. Multiple myeloma not having achieved remission (HCC)  Chronic. He is followed by Hem/Oncology as well.   - Referral to Chronic Care Management Services  3. Diabetes mellitus with end-stage renal disease ([place])  His wife is present with him today. She and the patient agree to CCM referral.   - Referral to Chronic Care Management Services  4. Benign hypertensive kidney disease with chronic kidney disease stage V or end stage renal disease (HCC)  Chronic, well controlled. He will continue with current meds. She will continue to hold meds prior to dialysis if BP is low in 100s.  He is encouraged to avoid adding salt to his foods.   - Referral to Chronic Care Management Services  5. Tremor  He has appt with Neuro tomorrow for f/u seizures. He plans to discuss during this visit.   6. Hyperparathyroidism, secondary renal (HCC)  Chronic, as per renal.   [name],  MD    THE PATIENT IS ENCOURAGED TO PRACTICE SOCIAL DISTANCING DUE TO THE COVID-19 PANDEMIC.

## 2019-08-08 ENCOUNTER — Other Ambulatory Visit: Payer: Self-pay | Admitting: Neurology

## 2019-08-08 DIAGNOSIS — D509 Iron deficiency anemia, unspecified: Secondary | ICD-10-CM | POA: Diagnosis not present

## 2019-08-08 DIAGNOSIS — N2581 Secondary hyperparathyroidism of renal origin: Secondary | ICD-10-CM | POA: Diagnosis not present

## 2019-08-08 DIAGNOSIS — Z23 Encounter for immunization: Secondary | ICD-10-CM | POA: Diagnosis not present

## 2019-08-08 DIAGNOSIS — D631 Anemia in chronic kidney disease: Secondary | ICD-10-CM | POA: Diagnosis not present

## 2019-08-08 DIAGNOSIS — N186 End stage renal disease: Secondary | ICD-10-CM | POA: Diagnosis not present

## 2019-08-08 DIAGNOSIS — E1129 Type 2 diabetes mellitus with other diabetic kidney complication: Secondary | ICD-10-CM | POA: Diagnosis not present

## 2019-08-08 DIAGNOSIS — Z992 Dependence on renal dialysis: Secondary | ICD-10-CM | POA: Diagnosis not present

## 2019-08-08 MED FILL — ACYCLOVIR 400 MG TABLET: 400 | 90 days supply | Qty: 90 | Fill #0

## 2019-08-08 MED FILL — CIPROFLOXACIN HCL 500 MG TA: 500 | 30 days supply | Qty: 30 | Fill #2

## 2019-08-08 MED FILL — PANTOPRAZOLE SOD DR 40 MG T: 40 | 90 days supply | Qty: 90 | Fill #1

## 2019-08-09 ENCOUNTER — Other Ambulatory Visit: Payer: Self-pay

## 2019-08-09 ENCOUNTER — Telehealth: Payer: Self-pay | Admitting: Neurology

## 2019-08-09 ENCOUNTER — Telehealth: Payer: Self-pay

## 2019-08-09 ENCOUNTER — Other Ambulatory Visit: Payer: Self-pay | Admitting: Neurology

## 2019-08-09 ENCOUNTER — Other Ambulatory Visit: Payer: Self-pay | Admitting: Oncology

## 2019-08-09 ENCOUNTER — Telehealth: Payer: Self-pay | Admitting: *Deleted

## 2019-08-09 ENCOUNTER — Inpatient Hospital Stay (HOSPITAL_BASED_OUTPATIENT_CLINIC_OR_DEPARTMENT_OTHER): Payer: Medicare Other | Admitting: Oncology

## 2019-08-09 ENCOUNTER — Inpatient Hospital Stay: Payer: Medicare Other | Attending: Oncology

## 2019-08-09 ENCOUNTER — Ambulatory Visit
Admission: RE | Admit: 2019-08-09 | Discharge: 2019-08-09 | Disposition: A | Discharge status: Home / Self Care | Payer: 59 | Source: Ambulatory Visit | Attending: Nephrology | Admitting: Nephrology

## 2019-08-09 VITALS — BP 129/56 | HR 62 | Temp 97.8°F | Resp 20 | Wt 181.0 lb

## 2019-08-09 DIAGNOSIS — C9000 Multiple myeloma not having achieved remission: Secondary | ICD-10-CM | POA: Insufficient documentation

## 2019-08-09 DIAGNOSIS — I69320 Aphasia following cerebral infarction: Secondary | ICD-10-CM | POA: Diagnosis not present

## 2019-08-09 DIAGNOSIS — N2581 Secondary hyperparathyroidism of renal origin: Secondary | ICD-10-CM | POA: Diagnosis not present

## 2019-08-09 DIAGNOSIS — D631 Anemia in chronic kidney disease: Secondary | ICD-10-CM | POA: Diagnosis not present

## 2019-08-09 DIAGNOSIS — N200 Calculus of kidney: Secondary | ICD-10-CM

## 2019-08-09 DIAGNOSIS — E1129 Type 2 diabetes mellitus with other diabetic kidney complication: Secondary | ICD-10-CM | POA: Diagnosis not present

## 2019-08-09 DIAGNOSIS — N186 End stage renal disease: Secondary | ICD-10-CM | POA: Diagnosis not present

## 2019-08-09 DIAGNOSIS — D649 Anemia, unspecified: Secondary | ICD-10-CM | POA: Diagnosis not present

## 2019-08-09 DIAGNOSIS — D509 Iron deficiency anemia, unspecified: Secondary | ICD-10-CM | POA: Diagnosis not present

## 2019-08-09 DIAGNOSIS — Z992 Dependence on renal dialysis: Secondary | ICD-10-CM | POA: Diagnosis not present

## 2019-08-09 DIAGNOSIS — I69351 Hemiplegia and hemiparesis following cerebral infarction affecting right dominant side: Secondary | ICD-10-CM | POA: Diagnosis not present

## 2019-08-09 DIAGNOSIS — N3289 Other specified disorders of bladder: Secondary | ICD-10-CM | POA: Diagnosis not present

## 2019-08-09 DIAGNOSIS — I120 Hypertensive chronic kidney disease with stage 5 chronic kidney disease or end stage renal disease: Secondary | ICD-10-CM | POA: Diagnosis not present

## 2019-08-09 DIAGNOSIS — E1122 Type 2 diabetes mellitus with diabetic chronic kidney disease: Secondary | ICD-10-CM | POA: Diagnosis not present

## 2019-08-09 HISTORY — PX: CATARACT EXTRACTION: SUR2

## 2019-08-09 LAB — CMP (CANCER CENTER ONLY)
ALT: 11 U/L (ref 0–44)
AST: 18 U/L (ref 15–41)
Albumin: 4.1 g/dL (ref 3.5–5.0)
Alkaline Phosphatase: 92 U/L (ref 38–126)
Anion gap: 14 (ref 5–15)
BUN: 27 mg/dL — ABNORMAL HIGH (ref 6–20)
CO2: 32 mmol/L (ref 22–32)
Calcium: 9.9 mg/dL (ref 8.9–10.3)
Chloride: 93 mmol/L — ABNORMAL LOW (ref 98–111)
Creatinine: 8.19 mg/dL (ref 0.61–1.24)
GFR, Est AFR Am: 8 mL/min — ABNORMAL LOW (ref >60)
GFR, Estimated: 7 mL/min — ABNORMAL LOW (ref >60)
Glucose, Bld: 148 mg/dL — ABNORMAL HIGH (ref 70–99)
Potassium: 4.5 mmol/L (ref 3.5–5.1)
Sodium: 139 mmol/L (ref 135–145)
Total Bilirubin: 1.1 mg/dL (ref 0.3–1.2)
Total Protein: 7.7 g/dL (ref 6.5–8.1)

## 2019-08-09 LAB — CBC WITH DIFFERENTIAL (CANCER CENTER ONLY)
Abs Immature Granulocytes: 0.01 10*3/uL (ref 0.00–0.07)
Basophils Absolute: 0.1 10*3/uL (ref 0.0–0.1)
Basophils Relative: 2 %
Eosinophils Absolute: 0.7 10*3/uL — ABNORMAL HIGH (ref 0.0–0.5)
Eosinophils Relative: 11 %
HCT: 28.2 % — ABNORMAL LOW (ref 39.0–52.0)
Hemoglobin: 10.5 g/dL — ABNORMAL LOW (ref 13.0–17.0)
Immature Granulocytes: 0 %
Lymphocytes Relative: 11 %
Lymphs Abs: 0.7 10*3/uL (ref 0.7–4.0)
MCH: 35.6 pg — ABNORMAL HIGH (ref 26.0–34.0)
MCHC: 37.2 g/dL — ABNORMAL HIGH (ref 30.0–36.0)
MCV: 95.6 fL (ref 80.0–100.0)
Monocytes Absolute: 0.8 10*3/uL (ref 0.1–1.0)
Monocytes Relative: 14 %
Neutro Abs: 3.6 10*3/uL (ref 1.7–7.7)
Neutrophils Relative %: 62 %
Platelet Count: 314 10*3/uL (ref 150–400)
RBC: 2.95 MIL/uL — ABNORMAL LOW (ref 4.22–5.81)
RDW: 14.4 % (ref 11.5–15.5)
WBC Count: 5.8 10*3/uL (ref 4.0–10.5)
nRBC: 0.9 % — ABNORMAL HIGH (ref 0.0–0.2)

## 2019-08-09 NOTE — Telephone Encounter (Signed)
Dr. Alen Blew made aware of critical creatinine result of 8.19 received from B. Linus Orn, RN. No further orders received.

## 2019-08-09 NOTE — Telephone Encounter (Signed)
Left message for patients wife to clarify clonazepam directions.

## 2019-08-09 NOTE — Telephone Encounter (Signed)
Can you pls confirm with wife, he is supposed to be taking clonazepam 2 tabs 75mns prior to dialysis. And then after dialysis, he should be taking 1 tablet of Vimpat. Thanks

## 2019-08-09 NOTE — Progress Notes (Signed)
After consultation with pharmacy, subcutaneous daratumumab may be a better option for him given his poor venous access and would accommodate his dialysis schedule better.  We will cancel Port-A-Cath insertion at this time and will proceed with subcutaneous daratumumab instead.

## 2019-08-09 NOTE — Telephone Encounter (Signed)
Received call from lab with creatinine level of 8.12  Lanelle Bal, RN and Dr. Alen Blew made aware

## 2019-08-09 NOTE — Telephone Encounter (Signed)
Patient wife returned your call left message with after hour service on 08-09-19 at 12:26 pm

## 2019-08-09 NOTE — Telephone Encounter (Signed)
Spoke with patients wife and clarified medication directions.  Also sent mychart message with directions.  Increase Vimpat to 246m twice a day, on dialysis days, give additional 2037mtablet after dialysis, Take the clonazepam 0.36m69m tabs 30 mins prior to dialysis

## 2019-08-09 NOTE — Progress Notes (Signed)
Vimpat 243m was approved.  Reference number [id number]  3 tablets a day, effective 07/12/2019-07/10/2020.

## 2019-08-09 NOTE — Telephone Encounter (Signed)
Documentation from [place] says that patients says he takes 2 clonazepam before hemodialysis and 1 after.  If this is appropriate please change directions and reorder, otherwise please advise.

## 2019-08-09 NOTE — Progress Notes (Signed)
DISCONTINUE ON PATHWAY REGIMEN - Multiple Myeloma and Other Plasma Cell Dyscrasias     A cycle is every 21 days:     Bortezomib      Cyclophosphamide      Dexamethasone   **Always confirm dose/schedule in your pharmacy ordering system**  REASON: Other Reason PRIOR TREATMENT: [id number]: CyBorD (Cyclophosphamide (IV) + Bortezomib Subcut D1, 4, 8, 11 + Dexamethasone) q21 Days x 4-8 Cycles TREATMENT RESPONSE: Partial Response (PR)  START ON PATHWAY REGIMEN - Multiple Myeloma and Other Plasma Cell Dyscrasias     Cycles 1 through 3: A cycle is every 21 days:     Dexamethasone      Bortezomib      Daratumumab and hyaluronidase-fihj    Cycles 4 through 8: A cycle is every 21 days:     Dexamethasone      Bortezomib      Daratumumab and hyaluronidase-fihj    Cycles 9 and beyond: A cycle is every 28 days:     Daratumumab and hyaluronidase-fihj   **Always confirm dose/schedule in your pharmacy ordering system**  Patient Characteristics: Relapsed / Refractory, Second through Fourth Lines of Therapy R-ISS Staging: II Disease Classification: Relapsed Line of Therapy: Second Line Intent of Therapy: Non-Curative / Palliative Intent, Discussed with Patient

## 2019-08-09 NOTE — Progress Notes (Signed)
Hematology and Oncology Follow Up Visit  [place] [id number] 1966-02-07 53 y.o. 08/09/2019 1:36 PM    Principle Diagnosis: 53 year old man with multiple myeloma diagnosed in September 2019.  He was found to have IgG kappa with infiltration of the plasma cells in the bone marrow up to 20%.   Past therapy: Velcade dexamethasone and cyclophosphamide weekly started on 05/25/2018.  Velcade is given at 1.5 mg/m weekly, dexamethasone is 20 mg weekly and and Cytoxan is given at 650 mg total dose.  Last treatment was given in August 2020.    Therapy was held today in anticipation of high-dose chemotherapy and autologous stem cell transplant.  He did not receive stem cell transplant due to subdural hematoma and sepsis in September 2020.  Current therapy: Under evaluation for with the start of salvage therapy  Interim History:  Mr. Chancellor is here for repeat follow-up. Since the last visit, he reports some improvement in his overall health with slight improvement in his mobility. He is still requiring quite a bit of assistance with ambulation with the help of a walker without any recent falls or syncope. He denies any chest pain or difficulty breathing. He denies any bone pain or pathological fractures. He continues to be dialysis dependent which he tolerates without any issues. He still participate in occupational therapy.   Patient denied headaches, blurry vision, syncope or seizures.  Denies any fevers, chills or sweats.  Denied chest pain, palpitation, orthopnea or leg edema.  Denied cough, wheezing or hemoptysis.  Denied nausea, vomiting or abdominal pain.  Denies any constipation or diarrhea.  Denies any frequency urgency or hesitancy.  Denies any arthralgias or myalgias.  Denies any skin rashes or lesions.  Denies any bleeding or clotting tendency.  Denies any easy bruising.  Denies any hair or nail changes.  Denies any anxiety or depression.  Remaining review of system is  negative.                    Medications: I have reviewed the patient's current medications.  Current Outpatient Medications:  .  acetaminophen (TYLENOL) 325 MG tablet, Take 1-2 tablets (325-650 mg total) by mouth every 4 (four) hours as needed for mild pain., Disp:  , Rfl:  .  acyclovir (ZOVIRAX) 400 MG tablet, Take 400 mg by mouth at bedtime., Disp: , Rfl:  .  albuterol (PROAIR HFA) 108 (90 Base) MCG/ACT inhaler, Inhale two puffs every 4-6 hours if needed for cough or wheeze, Disp: 1 Inhaler, Rfl: 1 .  amLODipine (NORVASC) 2.5 MG tablet, Take 1 tablet (2.5 mg total) by mouth at bedtime. (Patient taking differently: Take 2.5 mg by mouth at bedtime. ON HOLD PER WIFE), Disp: 30 tablet, Rfl: 0 .  calcium carbonate (TUMS - DOSED IN MG ELEMENTAL CALCIUM) 500 MG chewable tablet, Chew 2 tablets by mouth at bedtime., Disp: , Rfl:  .  camphor-menthol (SARNA) lotion, Apply 1 application topically every 8 (eight) hours as needed for itching., Disp: 222 mL, Rfl: 0 .  Cetirizine HCl 10 MG CAPS, Take 1 capsule by mouth daily., Disp: , Rfl:  .  cinacalcet (SENSIPAR) 60 MG tablet, Take 60 mg by mouth every evening. ON HOLD FOR DIALYSIS PER WIFE, Disp: , Rfl:  .  ciprofloxacin (CIPRO) 500 MG tablet, Take 500 mg by mouth every evening. For 120 days, Disp: , Rfl:  .  clonazePAM (KLONOPIN) 0.5 MG tablet, Take 2 tablets every Monday, Wednesday, and Friday with hemodialysis, Disp: 30 tablet, Rfl: 5 .  fluticasone furoate-vilanterol (BREO ELLIPTA) 200-25 MCG/INH AEPB, Inhale 1 puff into the lungs daily., Disp: , Rfl:  .  hydrOXYzine (ATARAX/VISTARIL) 25 MG tablet, Take 1 tablet (25 mg total) by mouth every 8 (eight) hours as needed for itching., Disp: 30 tablet, Rfl: 0 .  lacosamide (VIMPAT) 200 MG TABS tablet, Take 1 tablet twice a day except on dialysis days M-W-F give 1 tab in AM, 1 tab after dialysis, 1 tab in PM., Disp: 90 tablet, Rfl: 5 .  lamoTRIgine (LAMICTAL) 150 MG tablet, Take 1 tablet (150  mg total) by mouth 2 (two) times daily., Disp: 180 tablet, Rfl: 3 .  metoprolol tartrate (LOPRESSOR) 25 MG tablet, Take 12.5 mg by mouth 2 (two) times daily., Disp: , Rfl:  .  multivitamin (RENA-VIT) TABS tablet, Take 1 tablet by mouth at bedtime., Disp: , Rfl:  .  pantoprazole (PROTONIX) 40 MG tablet, Take 40 mg by mouth daily., Disp: , Rfl:  .  prochlorperazine (COMPAZINE) 10 MG tablet, TAKE 1 TABLET (10 MG TOTAL) BY MOUTH EVERY 6 (SIX) HOURS AS NEEDED FOR NAUSEA OR VOMITING., Disp: 30 tablet, Rfl: 0  Allergies:  Allergies  Allergen Reactions  . Codeine Rash and Other (See Comments)    Unknown reaction (patient says it was more serious than just a rash, but he can't remember what happened) Unknown reaction (patient says it was more serious than just a rash, but he can't remember what happened)  . Codeine Swelling    Past Medical History, Surgical history, Social history, and Family History without any changes in review.  Physical Exam:   Blood pressure (!) 129/56, pulse 62, temperature 97.8 F (36.6 C), temperature source Temporal, resp. rate 20, weight 181 lb (82.1 kg), SpO2 100 %.     ECOG: 1   General appearance: Comfortable appearing without any discomfort Head: Normocephalic without any trauma Oropharynx: Mucous membranes are moist and pink without any thrush or ulcers. Eyes: Pupils are equal and round reactive to light. Lymph nodes: No cervical, supraclavicular, inguinal or axillary lymphadenopathy.   Heart:regular rate and rhythm.  S1 and S2 without leg edema. Lung: Clear without any rhonchi or wheezes.  No dullness to percussion. Abdomin: Soft, nontender, nondistended with good bowel sounds.  No hepatosplenomegaly. Musculoskeletal: No joint deformity or effusion.  Full range of motion noted. Neurological: No deficits noted on motor, sensory and deep tendon reflex exam. Skin: No petechial rash or dryness.  Appeared moist.               Lab Results: Lab  Results  Component Value Date   WBC 6.3 07/05/2019   HGB 9.5 (L) 07/05/2019   HCT Unable to determine due to a cold agglutinin 07/05/2019   MCV Unable to determine due to a cold agglutinin 07/05/2019   PLT 286 07/05/2019     Chemistry      Component Value Date/Time   NA 143 07/05/2019 1146   NA 138 07/03/2017 0813   K 4.4 07/05/2019 1146   K 4.2 07/03/2017 0813   CL 98 07/05/2019 1146   CO2 30 07/05/2019 1146   CO2 27 07/03/2017 0813   BUN 39 (H) 07/05/2019 1146   BUN 42 (A) 02/16/2019   BUN 46.4 (H) 07/03/2017 0813   CREATININE 8.97 (HH) 07/05/2019 1146   CREATININE 10.9 (HH) 07/03/2017 0813      Component Value Date/Time   CALCIUM 8.5 (L) 07/05/2019 1146   CALCIUM 9.3 07/03/2017 0813   ALKPHOS 94 07/05/2019 1146   ALKPHOS  77 07/03/2017 0813   AST 43 (H) 07/05/2019 1146   AST 16 07/03/2017 0813   ALT 57 (H) 07/05/2019 1146   ALT 14 07/03/2017 0813   BILITOT 1.0 07/05/2019 1146   BILITOT 0.93 07/03/2017 0813        Impression and Plan:  53 year old man with:  1.  Multiple myeloma diagnosed in 2019.  He was found to have IgG kappa evolved from MGUS.  The natural course of his disease was updated again.  Given his other health conditions including subdural hematoma recent sepsis, high-dose chemotherapy and stem cell transplant was deferred at this time to a later date.  It was recommended that he start different salvage therapy in the interim and reevaluate him in 6 months.  It was suggested by Dr. Alvie Heidelberg to start salvage therapy utilizing Velcade, daratumumab and dexamethasone. Complication associated with this therapy specifically daratumumab was discussed. Complication associated with this treatment including infusion related issues, hemolysis in addition to nausea vomiting and fatigue. After discussion today he is agreeable to proceed at this time. He understands the lengthy infusion associated with this treatments which could be shortened in the future with  exposure.       2. Anemia: His hemoglobin is adequate at this time does not require any intervention. This is related to his renal failure and malignancy.   3.  Proteus bacteremia: No issues reported at this time. He does not have any fevers and blood cultures have been negative in Oct 2020.  4. IV access: Risks and benefits of Port-A-Cath insertion was discussed today. Given his poor IV access he would require having a Port-A-Cath insertion at this time. Complications that include bleeding, thrombosis and more importantly infection was discussed. After discussion he is agreeable to have that done in the near future.  5.  Goals of care: Aggressive measures are warranted despite that his disease might not be curable.    6. Follow-up: He will return in the next for weeks to start therapy.  25  minutes was spent with the patient face-to-face today.  More than 50% of time was spent on updating his disease status, treatment options and answering questions regarding future plan of care.     Zola Button, MD 12/1/20201:36 PM

## 2019-08-10 DIAGNOSIS — Z992 Dependence on renal dialysis: Secondary | ICD-10-CM | POA: Diagnosis not present

## 2019-08-10 DIAGNOSIS — D509 Iron deficiency anemia, unspecified: Secondary | ICD-10-CM | POA: Diagnosis not present

## 2019-08-10 DIAGNOSIS — N2581 Secondary hyperparathyroidism of renal origin: Secondary | ICD-10-CM | POA: Diagnosis not present

## 2019-08-10 DIAGNOSIS — D631 Anemia in chronic kidney disease: Secondary | ICD-10-CM | POA: Diagnosis not present

## 2019-08-10 DIAGNOSIS — E1129 Type 2 diabetes mellitus with other diabetic kidney complication: Secondary | ICD-10-CM | POA: Diagnosis not present

## 2019-08-10 DIAGNOSIS — N186 End stage renal disease: Secondary | ICD-10-CM | POA: Diagnosis not present

## 2019-08-10 LAB — MULTIPLE MYELOMA PANEL, SERUM
Albumin SerPl Elph-Mcnc: 3.8 g/dL (ref 2.9–4.4)
Albumin/Glob SerPl: 1.4 (ref 0.7–1.7)
Alpha 1: 0.3 g/dL (ref 0.0–0.4)
Alpha2 Glob SerPl Elph-Mcnc: 0.7 g/dL (ref 0.4–1.0)
B-Globulin SerPl Elph-Mcnc: 0.8 g/dL (ref 0.7–1.3)
Gamma Glob SerPl Elph-Mcnc: 1.1 g/dL (ref 0.4–1.8)
Globulin, Total: 2.8 g/dL (ref 2.2–3.9)
IgA: 88 mg/dL — ABNORMAL LOW (ref 90–386)
IgG (Immunoglobin G), Serum: 1319 mg/dL (ref 603–1613)
IgM (Immunoglobulin M), Srm: 34 mg/dL (ref 20–172)
Total Protein ELP: 6.6 g/dL (ref 6.0–8.5)

## 2019-08-10 LAB — KAPPA/LAMBDA LIGHT CHAINS
Kappa free light chain: 336.1 mg/L — ABNORMAL HIGH (ref 3.3–19.4)
Kappa, lambda light chain ratio: 2.9 — ABNORMAL HIGH (ref 0.26–1.65)
Lambda free light chains: 115.8 mg/L — ABNORMAL HIGH (ref 5.7–26.3)

## 2019-08-11 ENCOUNTER — Telehealth: Payer: Self-pay | Admitting: Oncology

## 2019-08-11 DIAGNOSIS — I120 Hypertensive chronic kidney disease with stage 5 chronic kidney disease or end stage renal disease: Secondary | ICD-10-CM | POA: Diagnosis not present

## 2019-08-11 DIAGNOSIS — I69351 Hemiplegia and hemiparesis following cerebral infarction affecting right dominant side: Secondary | ICD-10-CM | POA: Diagnosis not present

## 2019-08-11 DIAGNOSIS — C9000 Multiple myeloma not having achieved remission: Secondary | ICD-10-CM | POA: Diagnosis not present

## 2019-08-11 DIAGNOSIS — N186 End stage renal disease: Secondary | ICD-10-CM | POA: Diagnosis not present

## 2019-08-11 DIAGNOSIS — I69320 Aphasia following cerebral infarction: Secondary | ICD-10-CM | POA: Diagnosis not present

## 2019-08-11 DIAGNOSIS — E1122 Type 2 diabetes mellitus with diabetic chronic kidney disease: Secondary | ICD-10-CM | POA: Diagnosis not present

## 2019-08-11 MED FILL — PREDNISOLONE AC 1% EYE DROP: 1 | 30 days supply | Qty: 10 | Fill #0

## 2019-08-11 MED FILL — BRIMONIDINE TARTRATE 0.2 %: 0.2 | 30 days supply | Qty: 5 | Fill #0

## 2019-08-11 MED FILL — OFLOXACIN 0.3% EYE DROPS: 0.3 | 30 days supply | Qty: 5 | Fill #0

## 2019-08-11 MED FILL — VIMPAT 200 MG TABLET: 200 | 30 days supply | Qty: 90 | Fill #0

## 2019-08-11 MED FILL — KETOROLAC 0.5% OPHTH SOLN: 0.5 | 30 days supply | Qty: 3 | Fill #0

## 2019-08-11 NOTE — Telephone Encounter (Signed)
Scheduled appts per the los 12/1.  Scheduled treatments for 4 hrs per the los.  Spoke with pt spouse and she is aware of the appt dates and time.

## 2019-08-12 ENCOUNTER — Ambulatory Visit: Payer: Medicare Other | Admitting: Neurology

## 2019-08-12 ENCOUNTER — Telehealth: Payer: Self-pay

## 2019-08-12 DIAGNOSIS — E1129 Type 2 diabetes mellitus with other diabetic kidney complication: Secondary | ICD-10-CM | POA: Diagnosis not present

## 2019-08-12 DIAGNOSIS — D631 Anemia in chronic kidney disease: Secondary | ICD-10-CM | POA: Diagnosis not present

## 2019-08-12 DIAGNOSIS — N186 End stage renal disease: Secondary | ICD-10-CM | POA: Diagnosis not present

## 2019-08-12 DIAGNOSIS — Z992 Dependence on renal dialysis: Secondary | ICD-10-CM | POA: Diagnosis not present

## 2019-08-12 DIAGNOSIS — D509 Iron deficiency anemia, unspecified: Secondary | ICD-10-CM | POA: Diagnosis not present

## 2019-08-12 DIAGNOSIS — N2581 Secondary hyperparathyroidism of renal origin: Secondary | ICD-10-CM | POA: Diagnosis not present

## 2019-08-12 NOTE — Telephone Encounter (Signed)
Patients wife sylvia called and asked for call back - no stated reason please return call [phone]

## 2019-08-12 NOTE — Telephone Encounter (Signed)
I have given to [place] to schedule appointment for transport wheelchair eval with [name].

## 2019-08-12 NOTE — Telephone Encounter (Signed)
She can schedule an appointment for documentation necessary for a chair with either myself or [name], whomever has an opening first.

## 2019-08-12 NOTE — Telephone Encounter (Signed)
Spoke to wife and she is needing a transfer chair for patient. [facility] just needs a Tax inspector with diagnosis and doctor notes supporting the need for a transport wheelchair, demographics.

## 2019-08-13 DIAGNOSIS — E1065 Type 1 diabetes mellitus with hyperglycemia: Secondary | ICD-10-CM | POA: Diagnosis not present

## 2019-08-14 DIAGNOSIS — E1122 Type 2 diabetes mellitus with diabetic chronic kidney disease: Secondary | ICD-10-CM | POA: Diagnosis not present

## 2019-08-14 DIAGNOSIS — I120 Hypertensive chronic kidney disease with stage 5 chronic kidney disease or end stage renal disease: Secondary | ICD-10-CM | POA: Diagnosis not present

## 2019-08-14 DIAGNOSIS — N186 End stage renal disease: Secondary | ICD-10-CM | POA: Diagnosis not present

## 2019-08-14 DIAGNOSIS — C9000 Multiple myeloma not having achieved remission: Secondary | ICD-10-CM | POA: Diagnosis not present

## 2019-08-14 DIAGNOSIS — I69320 Aphasia following cerebral infarction: Secondary | ICD-10-CM | POA: Diagnosis not present

## 2019-08-14 DIAGNOSIS — I69351 Hemiplegia and hemiparesis following cerebral infarction affecting right dominant side: Secondary | ICD-10-CM | POA: Diagnosis not present

## 2019-08-15 DIAGNOSIS — Z992 Dependence on renal dialysis: Secondary | ICD-10-CM | POA: Diagnosis not present

## 2019-08-15 DIAGNOSIS — D509 Iron deficiency anemia, unspecified: Secondary | ICD-10-CM | POA: Diagnosis not present

## 2019-08-15 DIAGNOSIS — E1129 Type 2 diabetes mellitus with other diabetic kidney complication: Secondary | ICD-10-CM | POA: Diagnosis not present

## 2019-08-15 DIAGNOSIS — N2581 Secondary hyperparathyroidism of renal origin: Secondary | ICD-10-CM | POA: Diagnosis not present

## 2019-08-15 DIAGNOSIS — D631 Anemia in chronic kidney disease: Secondary | ICD-10-CM | POA: Diagnosis not present

## 2019-08-15 DIAGNOSIS — N186 End stage renal disease: Secondary | ICD-10-CM | POA: Diagnosis not present

## 2019-08-16 DIAGNOSIS — I69351 Hemiplegia and hemiparesis following cerebral infarction affecting right dominant side: Secondary | ICD-10-CM | POA: Diagnosis not present

## 2019-08-16 DIAGNOSIS — E1122 Type 2 diabetes mellitus with diabetic chronic kidney disease: Secondary | ICD-10-CM | POA: Diagnosis not present

## 2019-08-16 DIAGNOSIS — I120 Hypertensive chronic kidney disease with stage 5 chronic kidney disease or end stage renal disease: Secondary | ICD-10-CM | POA: Diagnosis not present

## 2019-08-16 DIAGNOSIS — C9000 Multiple myeloma not having achieved remission: Secondary | ICD-10-CM | POA: Diagnosis not present

## 2019-08-16 DIAGNOSIS — I69320 Aphasia following cerebral infarction: Secondary | ICD-10-CM | POA: Diagnosis not present

## 2019-08-16 DIAGNOSIS — N186 End stage renal disease: Secondary | ICD-10-CM | POA: Diagnosis not present

## 2019-08-17 DIAGNOSIS — N2581 Secondary hyperparathyroidism of renal origin: Secondary | ICD-10-CM | POA: Diagnosis not present

## 2019-08-17 DIAGNOSIS — N186 End stage renal disease: Secondary | ICD-10-CM | POA: Diagnosis not present

## 2019-08-17 DIAGNOSIS — Z992 Dependence on renal dialysis: Secondary | ICD-10-CM | POA: Diagnosis not present

## 2019-08-17 DIAGNOSIS — D631 Anemia in chronic kidney disease: Secondary | ICD-10-CM | POA: Diagnosis not present

## 2019-08-17 DIAGNOSIS — E1129 Type 2 diabetes mellitus with other diabetic kidney complication: Secondary | ICD-10-CM | POA: Diagnosis not present

## 2019-08-17 DIAGNOSIS — D509 Iron deficiency anemia, unspecified: Secondary | ICD-10-CM | POA: Diagnosis not present

## 2019-08-17 LAB — BASIC METABOLIC PANEL
BUN: 44 — AB (ref 4–21)
Chloride: 95 — AB (ref 99–108)
Creatinine: 10.9 — AB (ref 0.6–1.3)
Glucose: 145

## 2019-08-17 LAB — CBC AND DIFFERENTIAL
HCT: 33 — AB (ref 41–53)
Hemoglobin: 11.4 — AB (ref 13.5–17.5)

## 2019-08-17 LAB — IRON,TIBC AND FERRITIN PANEL: UIBC: 139

## 2019-08-17 LAB — CBC: RBC: 3.15 — AB (ref 3.87–5.11)

## 2019-08-17 LAB — COMPREHENSIVE METABOLIC PANEL: Calcium: 11.1 — AB (ref 8.7–10.7)

## 2019-08-17 MED FILL — clonazePAM 0.5 MG TABS: 0.5 | 30 days supply | Qty: 30 | Fill #0

## 2019-08-18 DIAGNOSIS — E1122 Type 2 diabetes mellitus with diabetic chronic kidney disease: Secondary | ICD-10-CM | POA: Diagnosis not present

## 2019-08-18 DIAGNOSIS — I69320 Aphasia following cerebral infarction: Secondary | ICD-10-CM | POA: Diagnosis not present

## 2019-08-18 DIAGNOSIS — C9000 Multiple myeloma not having achieved remission: Secondary | ICD-10-CM | POA: Diagnosis not present

## 2019-08-18 DIAGNOSIS — I120 Hypertensive chronic kidney disease with stage 5 chronic kidney disease or end stage renal disease: Secondary | ICD-10-CM | POA: Diagnosis not present

## 2019-08-18 DIAGNOSIS — N186 End stage renal disease: Secondary | ICD-10-CM | POA: Diagnosis not present

## 2019-08-18 DIAGNOSIS — I69351 Hemiplegia and hemiparesis following cerebral infarction affecting right dominant side: Secondary | ICD-10-CM | POA: Diagnosis not present

## 2019-08-19 DIAGNOSIS — N2581 Secondary hyperparathyroidism of renal origin: Secondary | ICD-10-CM | POA: Diagnosis not present

## 2019-08-19 DIAGNOSIS — D509 Iron deficiency anemia, unspecified: Secondary | ICD-10-CM | POA: Diagnosis not present

## 2019-08-19 DIAGNOSIS — E1129 Type 2 diabetes mellitus with other diabetic kidney complication: Secondary | ICD-10-CM | POA: Diagnosis not present

## 2019-08-19 DIAGNOSIS — D631 Anemia in chronic kidney disease: Secondary | ICD-10-CM | POA: Diagnosis not present

## 2019-08-19 DIAGNOSIS — N186 End stage renal disease: Secondary | ICD-10-CM | POA: Diagnosis not present

## 2019-08-19 DIAGNOSIS — Z992 Dependence on renal dialysis: Secondary | ICD-10-CM | POA: Diagnosis not present

## 2019-08-22 DIAGNOSIS — N2581 Secondary hyperparathyroidism of renal origin: Secondary | ICD-10-CM | POA: Diagnosis not present

## 2019-08-22 DIAGNOSIS — D509 Iron deficiency anemia, unspecified: Secondary | ICD-10-CM | POA: Diagnosis not present

## 2019-08-22 DIAGNOSIS — N186 End stage renal disease: Secondary | ICD-10-CM | POA: Diagnosis not present

## 2019-08-22 DIAGNOSIS — E1129 Type 2 diabetes mellitus with other diabetic kidney complication: Secondary | ICD-10-CM | POA: Diagnosis not present

## 2019-08-22 DIAGNOSIS — Z992 Dependence on renal dialysis: Secondary | ICD-10-CM | POA: Diagnosis not present

## 2019-08-22 DIAGNOSIS — D631 Anemia in chronic kidney disease: Secondary | ICD-10-CM | POA: Diagnosis not present

## 2019-08-23 DIAGNOSIS — H2511 Age-related nuclear cataract, right eye: Secondary | ICD-10-CM | POA: Diagnosis not present

## 2019-08-23 DIAGNOSIS — H25811 Combined forms of age-related cataract, right eye: Secondary | ICD-10-CM | POA: Diagnosis not present

## 2019-08-24 DIAGNOSIS — N186 End stage renal disease: Secondary | ICD-10-CM | POA: Diagnosis not present

## 2019-08-24 DIAGNOSIS — D631 Anemia in chronic kidney disease: Secondary | ICD-10-CM | POA: Diagnosis not present

## 2019-08-24 DIAGNOSIS — D509 Iron deficiency anemia, unspecified: Secondary | ICD-10-CM | POA: Diagnosis not present

## 2019-08-24 DIAGNOSIS — Z992 Dependence on renal dialysis: Secondary | ICD-10-CM | POA: Diagnosis not present

## 2019-08-24 DIAGNOSIS — E1129 Type 2 diabetes mellitus with other diabetic kidney complication: Secondary | ICD-10-CM | POA: Diagnosis not present

## 2019-08-24 DIAGNOSIS — N2581 Secondary hyperparathyroidism of renal origin: Secondary | ICD-10-CM | POA: Diagnosis not present

## 2019-08-26 DIAGNOSIS — E1129 Type 2 diabetes mellitus with other diabetic kidney complication: Secondary | ICD-10-CM | POA: Diagnosis not present

## 2019-08-26 DIAGNOSIS — N186 End stage renal disease: Secondary | ICD-10-CM | POA: Diagnosis not present

## 2019-08-26 DIAGNOSIS — N2581 Secondary hyperparathyroidism of renal origin: Secondary | ICD-10-CM | POA: Diagnosis not present

## 2019-08-26 DIAGNOSIS — D631 Anemia in chronic kidney disease: Secondary | ICD-10-CM | POA: Diagnosis not present

## 2019-08-26 DIAGNOSIS — D509 Iron deficiency anemia, unspecified: Secondary | ICD-10-CM | POA: Diagnosis not present

## 2019-08-26 DIAGNOSIS — Z992 Dependence on renal dialysis: Secondary | ICD-10-CM | POA: Diagnosis not present

## 2019-08-26 MED FILL — KETOROLAC 0.5% OPHTH SOLN: 0.5 | 15 days supply | Qty: 3 | Fill #1

## 2019-08-27 DIAGNOSIS — N186 End stage renal disease: Secondary | ICD-10-CM | POA: Diagnosis not present

## 2019-08-27 DIAGNOSIS — C9000 Multiple myeloma not having achieved remission: Secondary | ICD-10-CM | POA: Diagnosis not present

## 2019-08-27 DIAGNOSIS — E1122 Type 2 diabetes mellitus with diabetic chronic kidney disease: Secondary | ICD-10-CM | POA: Diagnosis not present

## 2019-08-27 DIAGNOSIS — I69351 Hemiplegia and hemiparesis following cerebral infarction affecting right dominant side: Secondary | ICD-10-CM | POA: Diagnosis not present

## 2019-08-27 DIAGNOSIS — I69320 Aphasia following cerebral infarction: Secondary | ICD-10-CM | POA: Diagnosis not present

## 2019-08-27 DIAGNOSIS — I120 Hypertensive chronic kidney disease with stage 5 chronic kidney disease or end stage renal disease: Secondary | ICD-10-CM | POA: Diagnosis not present

## 2019-08-29 ENCOUNTER — Other Ambulatory Visit: Payer: Self-pay

## 2019-08-29 DIAGNOSIS — D631 Anemia in chronic kidney disease: Secondary | ICD-10-CM | POA: Diagnosis not present

## 2019-08-29 DIAGNOSIS — Z992 Dependence on renal dialysis: Secondary | ICD-10-CM | POA: Diagnosis not present

## 2019-08-29 DIAGNOSIS — N2581 Secondary hyperparathyroidism of renal origin: Secondary | ICD-10-CM | POA: Diagnosis not present

## 2019-08-29 DIAGNOSIS — E1129 Type 2 diabetes mellitus with other diabetic kidney complication: Secondary | ICD-10-CM | POA: Diagnosis not present

## 2019-08-29 DIAGNOSIS — C9000 Multiple myeloma not having achieved remission: Secondary | ICD-10-CM

## 2019-08-29 DIAGNOSIS — D509 Iron deficiency anemia, unspecified: Secondary | ICD-10-CM | POA: Diagnosis not present

## 2019-08-29 DIAGNOSIS — N186 End stage renal disease: Secondary | ICD-10-CM | POA: Diagnosis not present

## 2019-08-30 ENCOUNTER — Other Ambulatory Visit: Payer: Self-pay

## 2019-08-30 ENCOUNTER — Telehealth: Payer: Self-pay

## 2019-08-30 ENCOUNTER — Inpatient Hospital Stay: Payer: Medicare Other

## 2019-08-30 ENCOUNTER — Other Ambulatory Visit: Payer: Self-pay | Admitting: Oncology

## 2019-08-30 ENCOUNTER — Encounter: Payer: Self-pay | Admitting: Internal Medicine

## 2019-08-30 VITALS — BP 97/51 | HR 59 | Temp 97.9°F | Resp 18 | Wt 162.8 lb

## 2019-08-30 DIAGNOSIS — C9000 Multiple myeloma not having achieved remission: Secondary | ICD-10-CM | POA: Diagnosis not present

## 2019-08-30 DIAGNOSIS — D649 Anemia, unspecified: Secondary | ICD-10-CM | POA: Diagnosis not present

## 2019-08-30 LAB — CMP (CANCER CENTER ONLY)
ALT: 7 U/L (ref 0–44)
AST: 16 U/L (ref 15–41)
Albumin: 3.8 g/dL (ref 3.5–5.0)
Alkaline Phosphatase: 78 U/L (ref 38–126)
Anion gap: 13 (ref 5–15)
BUN: 36 mg/dL — ABNORMAL HIGH (ref 6–20)
CO2: 32 mmol/L (ref 22–32)
Calcium: 9.1 mg/dL (ref 8.9–10.3)
Chloride: 94 mmol/L — ABNORMAL LOW (ref 98–111)
Creatinine: 8.67 mg/dL (ref 0.61–1.24)
GFR, Est AFR Am: 7 mL/min — ABNORMAL LOW (ref >60)
GFR, Estimated: 6 mL/min — ABNORMAL LOW (ref >60)
Glucose, Bld: 144 mg/dL — ABNORMAL HIGH (ref 70–99)
Potassium: 4.6 mmol/L (ref 3.5–5.1)
Sodium: 139 mmol/L (ref 135–145)
Total Bilirubin: 0.8 mg/dL (ref 0.3–1.2)
Total Protein: 7 g/dL (ref 6.5–8.1)

## 2019-08-30 LAB — CBC WITH DIFFERENTIAL (CANCER CENTER ONLY)
Abs Immature Granulocytes: 0.02 10*3/uL (ref 0.00–0.07)
Basophils Absolute: 0.1 10*3/uL (ref 0.0–0.1)
Basophils Relative: 2 %
Eosinophils Absolute: 0.7 10*3/uL — ABNORMAL HIGH (ref 0.0–0.5)
Eosinophils Relative: 12 %
Hemoglobin: 11.4 g/dL — ABNORMAL LOW (ref 13.0–17.0)
Immature Granulocytes: 0 %
Lymphocytes Relative: 12 %
Lymphs Abs: 0.7 10*3/uL (ref 0.7–4.0)
MCV: 92 fL (ref 80.0–100.0)
Monocytes Absolute: 0.9 10*3/uL (ref 0.1–1.0)
Monocytes Relative: 14 %
Neutro Abs: 3.7 10*3/uL (ref 1.7–7.7)
Neutrophils Relative %: 60 %
Platelet Count: 238 10*3/uL (ref 150–400)
WBC Count: 6.2 10*3/uL (ref 4.0–10.5)
nRBC: 0.5 % — ABNORMAL HIGH (ref 0.0–0.2)

## 2019-08-30 LAB — TYPE AND SCREEN
ABO/RH(D): A POS
Antibody Screen: NEGATIVE

## 2019-08-30 LAB — PRETREATMENT RBC PHENOTYPE

## 2019-08-30 LAB — ABO/RH: ABO/RH(D): A POS

## 2019-08-30 MED ORDER — PROCHLORPERAZINE MALEATE 10 MG PO TABS
ORAL_TABLET | ORAL | Status: AC
  Filled 2019-08-30: qty 1

## 2019-08-30 MED ORDER — DIPHENHYDRAMINE HCL 25 MG PO CAPS
ORAL_CAPSULE | ORAL | Status: AC
  Filled 2019-08-30: qty 2

## 2019-08-30 MED ORDER — DEXAMETHASONE 4 MG PO TABS
20.0000 mg | ORAL_TABLET | Freq: Once | ORAL | Status: AC
  Administered 2019-08-30: 20 mg via ORAL

## 2019-08-30 MED ORDER — MONTELUKAST SODIUM 10 MG PO TABS
ORAL_TABLET | ORAL | Status: AC
  Filled 2019-08-30: qty 1

## 2019-08-30 MED ORDER — ACETAMINOPHEN 325 MG PO TABS
ORAL_TABLET | ORAL | Status: AC
  Filled 2019-08-30: qty 2

## 2019-08-30 MED ORDER — DEXAMETHASONE 4 MG PO TABS
ORAL_TABLET | ORAL | Status: AC
  Filled 2019-08-30: qty 5

## 2019-08-30 MED ORDER — MONTELUKAST SODIUM 10 MG PO TABS
10.0000 mg | ORAL_TABLET | Freq: Once | ORAL | Status: AC
  Administered 2019-08-30: 10 mg via ORAL

## 2019-08-30 MED ORDER — BORTEZOMIB CHEMO SQ INJECTION 3.5 MG (2.5MG/ML)
1.5000 mg/m2 | Freq: Once | INTRAMUSCULAR | Status: AC
  Administered 2019-08-30: 3 mg via SUBCUTANEOUS
  Filled 2019-08-30: qty 1.2

## 2019-08-30 MED ORDER — ACETAMINOPHEN 325 MG PO TABS
650.0000 mg | ORAL_TABLET | Freq: Once | ORAL | Status: AC
  Administered 2019-08-30: 650 mg via ORAL

## 2019-08-30 MED ORDER — PROCHLORPERAZINE MALEATE 10 MG PO TABS
10.0000 mg | ORAL_TABLET | Freq: Once | ORAL | Status: AC
  Administered 2019-08-30: 10 mg via ORAL

## 2019-08-30 MED ORDER — DIPHENHYDRAMINE HCL 25 MG PO CAPS
50.0000 mg | ORAL_CAPSULE | Freq: Once | ORAL | Status: AC
  Administered 2019-08-30: 50 mg via ORAL

## 2019-08-30 MED ORDER — DARATUMUMAB-HYALURONIDASE-FIHJ 1800-30000 MG-UT/15ML ~~LOC~~ SOLN
1800.0000 mg | Freq: Once | SUBCUTANEOUS | Status: AC
  Administered 2019-08-30: 1800 mg via SUBCUTANEOUS
  Filled 2019-08-30: qty 15

## 2019-08-30 NOTE — Telephone Encounter (Signed)
Dr. Alen Blew made aware of creatinine 8.67. Per Dr. Alen Blew it is ok to treat today with Velcade and Darzalex Faspro. Infusion RN made aware.

## 2019-08-30 NOTE — Patient Instructions (Addendum)
[facility] Discharge Instructions for Patients Receiving Chemotherapy  Today you received the following chemotherapy agents: Velcade/Darzalex Faspro  To help prevent nausea and vomiting after your treatment, we encourage you to take your nausea medication as directed.   If you develop nausea and vomiting that is not controlled by your nausea medication, call the clinic.   BELOW ARE SYMPTOMS THAT SHOULD BE REPORTED IMMEDIATELY:  *FEVER GREATER THAN 100.5 F  *CHILLS WITH OR WITHOUT FEVER  NAUSEA AND VOMITING THAT IS NOT CONTROLLED WITH YOUR NAUSEA MEDICATION  *UNUSUAL SHORTNESS OF BREATH  *UNUSUAL BRUISING OR BLEEDING  TENDERNESS IN MOUTH AND THROAT WITH OR WITHOUT PRESENCE OF ULCERS  *URINARY PROBLEMS  *BOWEL PROBLEMS  UNUSUAL RASH Items with * indicate a potential emergency and should be followed up as soon as possible.  Feel free to call the clinic should you have any questions or concerns. The clinic phone number is [phone].  Please show the [facility] at check-in to the Emergency Department and triage nurse.  Daratumumab injection What is this medicine? DARATUMUMAB (dar a toom ue mab) is a monoclonal antibody. It is used to treat multiple myeloma. This medicine may be used for other purposes; ask your health care provider or pharmacist if you have questions. COMMON BRAND NAME(S): DARZALEX What should I tell my health care provider before I take this medicine? They need to know if you have any of these conditions:  infection (especially a virus infection such as chickenpox, herpes, or hepatitis B virus)  lung or breathing disease  an unusual or allergic reaction to daratumumab, other medicines, foods, dyes, or preservatives  pregnant or trying to get pregnant  breast-feeding How should I use this medicine? This medicine is for infusion into a vein. It is given by a health care professional in a hospital or clinic setting. Talk to your  pediatrician regarding the use of this medicine in children. Special care may be needed. Overdosage: If you think you have taken too much of this medicine contact a poison control center or emergency room at once. NOTE: This medicine is only for you. Do not share this medicine with others. What if I miss a dose? Keep appointments for follow-up doses as directed. It is important not to miss your dose. Call your doctor or health care professional if you are unable to keep an appointment. What may interact with this medicine? Interactions have not been studied. This list may not describe all possible interactions. Give your health care provider a list of all the medicines, herbs, non-prescription drugs, or dietary supplements you use. Also tell them if you smoke, drink alcohol, or use illegal drugs. Some items may interact with your medicine. What should I watch for while using this medicine? This drug may make you feel generally unwell. Report any side effects. Continue your course of treatment even though you feel ill unless your doctor tells you to stop. This medicine can cause serious allergic reactions. To reduce your risk you may need to take medicine before treatment with this medicine. Take your medicine as directed. This medicine can affect the results of blood tests to match your blood type. These changes can last for up to 6 months after the final dose. Your healthcare provider will do blood tests to match your blood type before you start treatment. Tell all of your healthcare providers that you are being treated with this medicine before receiving a blood transfusion. This medicine can affect the results of some  tests used to determine treatment response; extra tests may be needed to evaluate response. Do not become pregnant while taking this medicine or for 3 months after stopping it. Women should inform their doctor if they wish to become pregnant or think they might be pregnant. There is a  potential for serious side effects to an unborn child. Talk to your health care professional or pharmacist for more information. What side effects may I notice from receiving this medicine? Side effects that you should report to your doctor or health care professional as soon as possible:  allergic reactions like skin rash, itching or hives, swelling of the face, lips, or tongue  breathing problems  chills  cough  dizziness  feeling faint or lightheaded  headache  low blood counts - this medicine may decrease the number of white blood cells, red blood cells and platelets. You may be at increased risk for infections and bleeding.  nausea, vomiting  shortness of breath  signs of decreased platelets or bleeding - bruising, pinpoint red spots on the skin, black, tarry stools, blood in the urine  signs of decreased red blood cells - unusually weak or tired, feeling faint or lightheaded, falls  signs of infection - fever or chills, cough, sore throat, pain or difficulty passing urine  signs and symptoms of liver injury like dark yellow or brown urine; general ill feeling or flu-like symptoms; light-colored stools; loss of appetite; right upper belly pain; unusually weak or tired; yellowing of the eyes or skin Side effects that usually do not require medical attention (report to your doctor or health care professional if they continue or are bothersome):  back pain  constipation  loss of appetite  diarrhea  joint pain  muscle cramps  pain, tingling, numbness in the hands or feet  swelling of the ankles, feet, hands  tiredness  trouble sleeping This list may not describe all possible side effects. Call your doctor for medical advice about side effects. You may report side effects to FDA at 1-800-FDA-1088. Where should I keep my medicine? Keep out of the reach of children. This drug is given in a hospital or clinic and will not be stored at home. NOTE: This sheet is a  summary. It may not cover all possible information. If you have questions about this medicine, talk to your doctor, pharmacist, or health care provider.  2020 [employer]vier/Gold Standard (2018-06-10 14:00:48)

## 2019-08-30 NOTE — Progress Notes (Signed)
Patient tolerated Faspro well. No c/o or s/s of distress or discomfort. Injection site clean and dry. No redness or swelling noted. VSS.

## 2019-08-31 DIAGNOSIS — D631 Anemia in chronic kidney disease: Secondary | ICD-10-CM | POA: Diagnosis not present

## 2019-08-31 DIAGNOSIS — N2581 Secondary hyperparathyroidism of renal origin: Secondary | ICD-10-CM | POA: Diagnosis not present

## 2019-08-31 DIAGNOSIS — E1129 Type 2 diabetes mellitus with other diabetic kidney complication: Secondary | ICD-10-CM | POA: Diagnosis not present

## 2019-08-31 DIAGNOSIS — Z992 Dependence on renal dialysis: Secondary | ICD-10-CM | POA: Diagnosis not present

## 2019-08-31 DIAGNOSIS — N186 End stage renal disease: Secondary | ICD-10-CM | POA: Diagnosis not present

## 2019-08-31 DIAGNOSIS — D509 Iron deficiency anemia, unspecified: Secondary | ICD-10-CM | POA: Diagnosis not present

## 2019-09-01 ENCOUNTER — Telehealth: Payer: Self-pay | Admitting: *Deleted

## 2019-09-01 DIAGNOSIS — I120 Hypertensive chronic kidney disease with stage 5 chronic kidney disease or end stage renal disease: Secondary | ICD-10-CM | POA: Diagnosis not present

## 2019-09-01 DIAGNOSIS — N186 End stage renal disease: Secondary | ICD-10-CM | POA: Diagnosis not present

## 2019-09-01 DIAGNOSIS — I69320 Aphasia following cerebral infarction: Secondary | ICD-10-CM | POA: Diagnosis not present

## 2019-09-01 DIAGNOSIS — C9000 Multiple myeloma not having achieved remission: Secondary | ICD-10-CM | POA: Diagnosis not present

## 2019-09-01 DIAGNOSIS — E1122 Type 2 diabetes mellitus with diabetic chronic kidney disease: Secondary | ICD-10-CM | POA: Diagnosis not present

## 2019-09-01 DIAGNOSIS — I69351 Hemiplegia and hemiparesis following cerebral infarction affecting right dominant side: Secondary | ICD-10-CM | POA: Diagnosis not present

## 2019-09-01 NOTE — Telephone Encounter (Signed)
Called & spoke with pt's wife to see how pt did with his recent new treatment.  She reports that he is doing well & no questions & concerns.

## 2019-09-01 NOTE — Telephone Encounter (Signed)
-----   Message from Wylene Men, RN sent at 08/30/2019  4:16 PM EST ----- Regarding: [place] Patient received first dose of Darzalex subcutaneous (Faspro). No c/o or s/s of distress or discomfort. Tolerated well. Injection site noted to have no redness or swelling. VSS.

## 2019-09-03 DIAGNOSIS — Z992 Dependence on renal dialysis: Secondary | ICD-10-CM | POA: Diagnosis not present

## 2019-09-03 DIAGNOSIS — D509 Iron deficiency anemia, unspecified: Secondary | ICD-10-CM | POA: Diagnosis not present

## 2019-09-03 DIAGNOSIS — D631 Anemia in chronic kidney disease: Secondary | ICD-10-CM | POA: Diagnosis not present

## 2019-09-03 DIAGNOSIS — E1129 Type 2 diabetes mellitus with other diabetic kidney complication: Secondary | ICD-10-CM | POA: Diagnosis not present

## 2019-09-03 DIAGNOSIS — N2581 Secondary hyperparathyroidism of renal origin: Secondary | ICD-10-CM | POA: Diagnosis not present

## 2019-09-03 DIAGNOSIS — N186 End stage renal disease: Secondary | ICD-10-CM | POA: Diagnosis not present

## 2019-09-05 DIAGNOSIS — I69320 Aphasia following cerebral infarction: Secondary | ICD-10-CM | POA: Diagnosis not present

## 2019-09-05 DIAGNOSIS — E1129 Type 2 diabetes mellitus with other diabetic kidney complication: Secondary | ICD-10-CM | POA: Diagnosis not present

## 2019-09-05 DIAGNOSIS — I69351 Hemiplegia and hemiparesis following cerebral infarction affecting right dominant side: Secondary | ICD-10-CM | POA: Diagnosis not present

## 2019-09-05 DIAGNOSIS — N2581 Secondary hyperparathyroidism of renal origin: Secondary | ICD-10-CM | POA: Diagnosis not present

## 2019-09-05 DIAGNOSIS — E1140 Type 2 diabetes mellitus with diabetic neuropathy, unspecified: Secondary | ICD-10-CM | POA: Diagnosis not present

## 2019-09-05 DIAGNOSIS — G4730 Sleep apnea, unspecified: Secondary | ICD-10-CM | POA: Diagnosis not present

## 2019-09-05 DIAGNOSIS — C9000 Multiple myeloma not having achieved remission: Secondary | ICD-10-CM | POA: Diagnosis not present

## 2019-09-05 DIAGNOSIS — I480 Paroxysmal atrial fibrillation: Secondary | ICD-10-CM | POA: Diagnosis not present

## 2019-09-05 DIAGNOSIS — Z8701 Personal history of pneumonia (recurrent): Secondary | ICD-10-CM | POA: Diagnosis not present

## 2019-09-05 DIAGNOSIS — M1030 Gout due to renal impairment, unspecified site: Secondary | ICD-10-CM | POA: Diagnosis not present

## 2019-09-05 DIAGNOSIS — I120 Hypertensive chronic kidney disease with stage 5 chronic kidney disease or end stage renal disease: Secondary | ICD-10-CM | POA: Diagnosis not present

## 2019-09-05 DIAGNOSIS — Z992 Dependence on renal dialysis: Secondary | ICD-10-CM | POA: Diagnosis not present

## 2019-09-05 DIAGNOSIS — Z86711 Personal history of pulmonary embolism: Secondary | ICD-10-CM | POA: Diagnosis not present

## 2019-09-05 DIAGNOSIS — E1122 Type 2 diabetes mellitus with diabetic chronic kidney disease: Secondary | ICD-10-CM | POA: Diagnosis not present

## 2019-09-05 DIAGNOSIS — N186 End stage renal disease: Secondary | ICD-10-CM | POA: Diagnosis not present

## 2019-09-05 DIAGNOSIS — D509 Iron deficiency anemia, unspecified: Secondary | ICD-10-CM | POA: Diagnosis not present

## 2019-09-05 DIAGNOSIS — D631 Anemia in chronic kidney disease: Secondary | ICD-10-CM | POA: Diagnosis not present

## 2019-09-05 MED FILL — VIMPAT 200 MG TABLET: 200 | 30 days supply | Qty: 90 | Fill #1

## 2019-09-06 ENCOUNTER — Encounter: Payer: Self-pay | Admitting: Registered Nurse

## 2019-09-06 ENCOUNTER — Inpatient Hospital Stay: Payer: Medicare Other

## 2019-09-06 ENCOUNTER — Other Ambulatory Visit: Payer: Self-pay

## 2019-09-06 ENCOUNTER — Encounter: Payer: Medicare Other | Attending: Physical Medicine & Rehabilitation | Admitting: Registered Nurse

## 2019-09-06 ENCOUNTER — Inpatient Hospital Stay (HOSPITAL_BASED_OUTPATIENT_CLINIC_OR_DEPARTMENT_OTHER): Payer: Medicare Other | Admitting: Oncology

## 2019-09-06 ENCOUNTER — Telehealth: Payer: Self-pay

## 2019-09-06 ENCOUNTER — Encounter: Payer: Self-pay | Admitting: Internal Medicine

## 2019-09-06 VITALS — BP 100/55 | HR 74 | Temp 98.8°F | Ht 70.0 in | Wt 162.0 lb

## 2019-09-06 VITALS — BP 147/65 | HR 64 | Temp 98.8°F | Resp 18 | Ht 72.0 in | Wt 187.4 lb

## 2019-09-06 VITALS — BP 96/54 | HR 66 | Resp 17

## 2019-09-06 DIAGNOSIS — I69051 Hemiplegia and hemiparesis following nontraumatic subarachnoid hemorrhage affecting right dominant side: Secondary | ICD-10-CM

## 2019-09-06 DIAGNOSIS — Z992 Dependence on renal dialysis: Secondary | ICD-10-CM | POA: Insufficient documentation

## 2019-09-06 DIAGNOSIS — C9000 Multiple myeloma not having achieved remission: Secondary | ICD-10-CM | POA: Diagnosis not present

## 2019-09-06 DIAGNOSIS — N186 End stage renal disease: Secondary | ICD-10-CM | POA: Diagnosis not present

## 2019-09-06 DIAGNOSIS — D649 Anemia, unspecified: Secondary | ICD-10-CM | POA: Diagnosis not present

## 2019-09-06 DIAGNOSIS — I69351 Hemiplegia and hemiparesis following cerebral infarction affecting right dominant side: Secondary | ICD-10-CM | POA: Diagnosis not present

## 2019-09-06 DIAGNOSIS — R2689 Other abnormalities of gait and mobility: Secondary | ICD-10-CM | POA: Diagnosis not present

## 2019-09-06 DIAGNOSIS — E1122 Type 2 diabetes mellitus with diabetic chronic kidney disease: Secondary | ICD-10-CM | POA: Diagnosis not present

## 2019-09-06 DIAGNOSIS — S065X9A Traumatic subdural hemorrhage with loss of consciousness of unspecified duration, initial encounter: Secondary | ICD-10-CM | POA: Insufficient documentation

## 2019-09-06 DIAGNOSIS — G40909 Epilepsy, unspecified, not intractable, without status epilepticus: Secondary | ICD-10-CM | POA: Diagnosis not present

## 2019-09-06 DIAGNOSIS — I69098 Other sequelae following nontraumatic subarachnoid hemorrhage: Secondary | ICD-10-CM

## 2019-09-06 DIAGNOSIS — R7881 Bacteremia: Secondary | ICD-10-CM | POA: Insufficient documentation

## 2019-09-06 DIAGNOSIS — I69320 Aphasia following cerebral infarction: Secondary | ICD-10-CM | POA: Diagnosis not present

## 2019-09-06 DIAGNOSIS — S065XAA Traumatic subdural hemorrhage with loss of consciousness status unknown, initial encounter: Secondary | ICD-10-CM

## 2019-09-06 DIAGNOSIS — R269 Unspecified abnormalities of gait and mobility: Secondary | ICD-10-CM | POA: Insufficient documentation

## 2019-09-06 DIAGNOSIS — G8110 Spastic hemiplegia affecting unspecified side: Secondary | ICD-10-CM | POA: Insufficient documentation

## 2019-09-06 DIAGNOSIS — I120 Hypertensive chronic kidney disease with stage 5 chronic kidney disease or end stage renal disease: Secondary | ICD-10-CM | POA: Diagnosis not present

## 2019-09-06 LAB — CMP (CANCER CENTER ONLY)
ALT: 7 U/L (ref 0–44)
AST: 12 U/L — ABNORMAL LOW (ref 15–41)
Albumin: 3.9 g/dL (ref 3.5–5.0)
Alkaline Phosphatase: 70 U/L (ref 38–126)
Anion gap: 12 (ref 5–15)
BUN: 31 mg/dL — ABNORMAL HIGH (ref 6–20)
CO2: 33 mmol/L — ABNORMAL HIGH (ref 22–32)
Calcium: 9.2 mg/dL (ref 8.9–10.3)
Chloride: 94 mmol/L — ABNORMAL LOW (ref 98–111)
Creatinine: 7.81 mg/dL (ref 0.61–1.24)
GFR, Est AFR Am: 8 mL/min — ABNORMAL LOW (ref >60)
GFR, Estimated: 7 mL/min — ABNORMAL LOW (ref >60)
Glucose, Bld: 117 mg/dL — ABNORMAL HIGH (ref 70–99)
Potassium: 4.4 mmol/L (ref 3.5–5.1)
Sodium: 139 mmol/L (ref 135–145)
Total Bilirubin: 0.8 mg/dL (ref 0.3–1.2)
Total Protein: 6.6 g/dL (ref 6.5–8.1)

## 2019-09-06 LAB — CBC WITH DIFFERENTIAL (CANCER CENTER ONLY)
Abs Immature Granulocytes: 0.01 10*3/uL (ref 0.00–0.07)
Basophils Absolute: 0 10*3/uL (ref 0.0–0.1)
Basophils Relative: 1 %
Eosinophils Absolute: 0.8 10*3/uL — ABNORMAL HIGH (ref 0.0–0.5)
Eosinophils Relative: 14 %
Hemoglobin: 10.7 g/dL — ABNORMAL LOW (ref 13.0–17.0)
Immature Granulocytes: 0 %
Lymphocytes Relative: 11 %
Lymphs Abs: 0.6 10*3/uL — ABNORMAL LOW (ref 0.7–4.0)
MCV: 91.9 fL (ref 80.0–100.0)
Monocytes Absolute: 1 10*3/uL (ref 0.1–1.0)
Monocytes Relative: 18 %
Neutro Abs: 3.2 10*3/uL (ref 1.7–7.7)
Neutrophils Relative %: 56 %
Platelet Count: 178 10*3/uL (ref 150–400)
WBC Count: 5.6 10*3/uL (ref 4.0–10.5)
nRBC: 0.4 % — ABNORMAL HIGH (ref 0.0–0.2)

## 2019-09-06 MED ORDER — MONTELUKAST SODIUM 10 MG PO TABS
ORAL_TABLET | ORAL | Status: AC
  Filled 2019-09-06: qty 1

## 2019-09-06 MED ORDER — ACETAMINOPHEN 325 MG PO TABS
ORAL_TABLET | ORAL | Status: AC
  Filled 2019-09-06: qty 2

## 2019-09-06 MED ORDER — BORTEZOMIB CHEMO SQ INJECTION 3.5 MG (2.5MG/ML)
1.5000 mg/m2 | Freq: Once | INTRAMUSCULAR | Status: AC
  Administered 2019-09-06: 3 mg via SUBCUTANEOUS
  Filled 2019-09-06: qty 1.2

## 2019-09-06 MED ORDER — DIPHENHYDRAMINE HCL 25 MG PO CAPS
50.0000 mg | ORAL_CAPSULE | Freq: Once | ORAL | Status: AC
  Administered 2019-09-06: 50 mg via ORAL

## 2019-09-06 MED ORDER — DEXAMETHASONE 4 MG PO TABS
20.0000 mg | ORAL_TABLET | Freq: Once | ORAL | Status: AC
  Administered 2019-09-06: 20 mg via ORAL

## 2019-09-06 MED ORDER — PROCHLORPERAZINE MALEATE 10 MG PO TABS
10.0000 mg | ORAL_TABLET | Freq: Once | ORAL | Status: AC
  Administered 2019-09-06: 10 mg via ORAL

## 2019-09-06 MED ORDER — DEXAMETHASONE 4 MG PO TABS
ORAL_TABLET | ORAL | Status: AC
  Filled 2019-09-06: qty 5

## 2019-09-06 MED ORDER — DIPHENHYDRAMINE HCL 25 MG PO CAPS
ORAL_CAPSULE | ORAL | Status: AC
  Filled 2019-09-06: qty 2

## 2019-09-06 MED ORDER — ACETAMINOPHEN 325 MG PO TABS
650.0000 mg | ORAL_TABLET | Freq: Once | ORAL | Status: AC
  Administered 2019-09-06: 650 mg via ORAL

## 2019-09-06 MED ORDER — MONTELUKAST SODIUM 10 MG PO TABS
10.0000 mg | ORAL_TABLET | Freq: Once | ORAL | Status: AC
  Administered 2019-09-06: 10 mg via ORAL

## 2019-09-06 MED ORDER — DEXAMETHASONE 4 MG PO TABS: 20.0000 mg | ORAL_TABLET | ORAL | 3 refills | Status: DC

## 2019-09-06 MED ORDER — PROCHLORPERAZINE MALEATE 10 MG PO TABS
ORAL_TABLET | ORAL | Status: AC
  Filled 2019-09-06: qty 1

## 2019-09-06 MED ORDER — DARATUMUMAB-HYALURONIDASE-FIHJ 1800-30000 MG-UT/15ML ~~LOC~~ SOLN
1800.0000 mg | Freq: Once | SUBCUTANEOUS | Status: AC
  Administered 2019-09-06: 1800 mg via SUBCUTANEOUS
  Filled 2019-09-06: qty 15

## 2019-09-06 MED FILL — DEXAMETHASONE 4 MG TABLET: 4 | 56 days supply | Qty: 40 | Fill #0

## 2019-09-06 NOTE — Progress Notes (Signed)
Ok to treat with labs as they resulted per MD today   One hour post obs Darzalex FAspro - VS taken, no concerns

## 2019-09-06 NOTE — Progress Notes (Signed)
Subjective:    Patient ID: Isaiah [name], male    DOB: 10/22/1965, 53 y.o.   MRN: [id number]  HPI: Isaiah [name] [facility] is a 53 y.o. male whose appointment was changed to a virtual office visit to reduce the risk of exposure to the COVID-19 virus and to help Isaiah [name] remain healthy and safe. The virtual visit will also provide continuity of care. Isaiah [name] agrees with virtual visit amd verbalizes understanding. Today we are performing a transport wheelchair evaluation for Isaiah [name], he has limited mobility that prevents him from completing his activities of daily living. He also is unable to safely ambulate 100 feet with his walker. He doesn't have the ability, strength or endurance to self-propel a wheelchair and requires a transport chair. Isaiah [name] has a caregiver who is able, willing and available to provide assistance with the transport chair.   Isaiah [name] past medical history includes SDH, spastic  hemiparesis, abnormality of gait, seizure disorder and ESRD on hemodialysis.  At this time Isaiah [name] is undergoing  chemotherapy for his multiple myeloma.   He states he is pain free. He rates his pain 0. His current weight at this time 165 lbs and his height is 70 inches.    Pain Inventory Average Pain 0 Pain Right Now 0 My pain is na  In the last 24 hours, has pain interfered with the following? General activity 0 Relation with others 0 Enjoyment of life 0 What TIME of day is your pain at its worst? na Sleep (in general) Good  Pain is worse with: na Pain improves with: na Relief from Meds: na  Mobility walk without assistance use a walker use a wheelchair  Function disabled: date disabled .  Neuro/Psych trouble walking  Prior Studies Any changes since last visit?  no  Physicians involved in your care Any changes since last visit?  no   Family History  Problem Relation Age of Onset  . Hypertension Mother   . Hypertension Father   . Stroke Father   . Sickle  cell anemia Brother   . Diabetes Mother   . Sickle cell anemia Brother   . Diabetes type I Brother   . Kidney disease Brother    Social History   Socioeconomic History  . Marital status: Married    Spouse name: sylvia  . Number of children: 1  . Years of education: college  . Highest education level: Not on file  Occupational History  . Occupation: [employer]  . Occupation: disability  Tobacco Use  . Smoking status: Never Smoker  . Smokeless tobacco: Never Used  Substance and Sexual Activity  . Alcohol use: Not Currently  . Drug use: Never  . Sexual activity: Yes    Partners: Female  Other Topics Concern  . Not on file  Social History Narrative   ** Merged History Encounter **       Isaiah [name] lives in 2 story home with his wife and 1 son Has masters degree in psychology Currently disabled.     Social Determinants of Health   Financial Resource Strain: Low Risk   . Difficulty of Paying Living Expenses: Not hard at all  Food Insecurity: No Food Insecurity  . Worried About Charity fundraiser in the Last Year: Never true  . Ran Out of Food in the Last Year: Never true  Transportation Needs: No Transportation Needs  . Lack of Transportation (Medical): No  . Lack of Transportation (Non-Medical):  No  Physical Activity: Inactive  . Days of Exercise per Week: 0 days  . Minutes of Exercise per Session: 0 min  Stress: No Stress Concern Present  . Feeling of Stress : Not at all  Social Connections:   . Frequency of Communication with Friends and Family: Not on file  . Frequency of Social Gatherings with Friends and Family: Not on file  . Attends Religious Services: Not on file  . Active Member of Clubs or Organizations: Not on file  . Attends Archivist Meetings: Not on file  . Marital Status: Not on file   Past Surgical History:  Procedure Laterality Date  . AV FISTULA PLACEMENT Right 12/05/2015   Procedure: INSERTION OF ARTERIOVENOUS (AV) GORE-TEX  GRAFT ARM;  Surgeon: Elam Dutch, MD;  Location: [place];  Service: Vascular;  Laterality: Right;  . BONE MARROW BIOPSY    . CHOLECYSTECTOMY    . CHOLECYSTECTOMY  1990's?  . COLONOSCOPY WITH PROPOFOL N/A 01/22/2017   Procedure: COLONOSCOPY WITH PROPOFOL;  Surgeon: Wilford Corner, MD;  [facility]: [facility];  Service: Endoscopy;  Laterality: N/A;  . EYE SURGERY Right   . INSERTION OF DIALYSIS CATHETER Right 10/16/2015   Procedure: INSERTION OF PALINDROME DIALYSIS CATHETER ;  Surgeon: Elam Dutch, MD;  Location: [place];  Service: Vascular;  Laterality: Right;  . OTHER SURGICAL HISTORY     Retinal surgery  . PARS PLANA VITRECTOMY  02/17/2012   Procedure: PARS PLANA VITRECTOMY WITH 25 GAUGE;  Surgeon: Hayden Pedro, MD;  Location: [place] [facility];  Service: Ophthalmology;  Laterality: Right;  . PERIPHERAL VASCULAR CATHETERIZATION N/A 03/20/2016   Procedure: A/V Shuntogram/Fistulagram;  Surgeon: Conrad Rayland, MD;  Location: [facility];  Service: Cardiovascular;  Laterality: N/A;  . TEE WITHOUT CARDIOVERSION N/A 12/02/2016   Procedure: TRANSESOPHAGEAL ECHOCARDIOGRAM (TEE);  Surgeon: Larey Dresser, MD;  Location: [place];  Service: Cardiovascular;  Laterality: N/A;   Past Medical History:  Diagnosis Date  . A-fib ([place])   . Anemia   . Asthma   . Diabetes mellitus without complication ([place])   . DM type 2 (diabetes mellitus, type 2) ([place]) 06/09/2019  . ESRD (end stage renal disease) on dialysis ([place])   . Essential hypertension 06/09/2019  . GIB (gastrointestinal bleeding)    Recurrent episodes- 09/2014, 09/2015 and 05/2016  . Gout   . History of recent blood transfusion 10/27/14   2 Units PRBC's  . Hyperkalemia 07/2011  . Hypertension   . Monoclonal gammopathy   . Multiple myeloma ([place])   . OSA on CPAP   . Pulmonary emboli ([place]) 07/2011   bilateral--treated with comadin X 6 months  . Pulmonary embolism ([place]) 07/2011; 09/27/2014   a. Bilat PE 07/2011 - unclear cause, tx with 6  months Coumadin.;   . Seizure disorder ([place]) 06/09/2019  . Seizures ([place])   . Sepsis ([place])   . Sickle cell-thalassemia disease ([place])    a. Sickle cell trait.  . Sleep apnea   . Stroke ([place])   . Stroke ([place]) 09/2015   R-MCA, L-MCA, PCA and bilateral cerebellar complicated by DVT/PE  . Subdural hematoma ([facility]) 05/2019   BP (!) 100/55   Pulse 74   Temp 98.8 F (37.1 C)   Ht 5' 10" (1.778 m)   Wt 162 lb (73.5 kg)   BMI 23.24 kg/m   Opioid Risk Score:   Fall Risk Score:  `1  Depression screen PHQ 2/9  Depression screen [facility] 2/9 03/17/2019  08/19/2018  Decreased Interest 0 0  Down, Depressed, Hopeless 0 0  PHQ - 2 Score 0 0  Altered sleeping 0 -  Tired, decreased energy 0 -  Change in appetite 0 -  Feeling bad or failure about yourself  0 -  Trouble concentrating 0 -  Moving slowly or fidgety/restless 1 -  Suicidal thoughts 0 -  PHQ-9 Score 1 -  Difficult doing work/chores Not difficult at all -  Some recent data might be hidden    Review of Systems  Constitutional: Negative.   HENT: Negative.   Eyes: Negative.   Respiratory: Negative.   Cardiovascular: Negative.   Gastrointestinal: Negative.   Endocrine: Negative.   Genitourinary:       Patient receives dialysis  Musculoskeletal: Positive for gait problem.  Skin: Negative.   Allergic/Immunologic: Negative.   Hematological: Negative.   Psychiatric/Behavioral: Negative.   All other systems reviewed and are negative.      Objective:   Physical Exam Vitals and nursing note reviewed.  Musculoskeletal:     Comments: No Physical Exam: Virtual Visit           Assessment & Plan:  1.SDH/ Spastic  Hemiparesis and abnormality of gait: Impaired ADL's/ Mobility and function secondary to acute on chronic SDH with 45m of midline shift to left- with Right hemiparesis and dysarthria: Neurology Following per Dr. PPosey ProntoNote. RArboriculturist 2.Seizure disorder:Continue current medication regimen. Neurology  Following.  3. ESRD on hemodialysis. Nephrology Following.   Telephone Call Established Patient Location of Patient: In his Home Location of Provider: In Office Time Spent: 10 Minutes

## 2019-09-06 NOTE — Progress Notes (Signed)
Hematology and Oncology Follow Up Visit  [place] [id number] 11-10-1965 53 y.o. 09/06/2019 9:41 AM    Principle Diagnosis: 54 year old man with IgG kappa multiple myeloma diagnosed in September 2019 with 20% involvement in the bone marrow.  He initially presented in 2016 with MGUS.   Past therapy: Velcade dexamethasone and cyclophosphamide weekly started on 05/25/2018.  Velcade is given at 1.5 mg/m weekly, dexamethasone is 20 mg weekly and and Cytoxan is given at 650 mg total dose.  Last treatment was given in August 2020.    Therapy was held today in anticipation of high-dose chemotherapy and autologous stem cell transplant.  He did not receive stem cell transplant due to subdural hematoma and sepsis in September 2020.  Current therapy:  Velcade at 1.5 mg per metered squared subcutaneously weekly, dexamethasone 20 mg weekly, daratumumab 1800 mg subcutaneous injection weekly.  Cycle 1 started on 08/30/2019.  Interim History:  Mr. Christina returns today for a repeat evaluation.  Since the last visit, he started salvage therapy utilizing regimen outlined above.  He tolerated the first week of therapy without any complaints.  He denies any nausea, vomiting or abdominal pain.  He denies any peripheral neuropathy or infusion related complications.  His performance status and quality of life remains limited but improving.  He still relies a wheelchair for the most part but does ambulate short distances.  Continues to participate in rehab at this time.   He denied headaches, blurry vision, syncope or seizures.  Denies any fevers, chills or sweats.  Denied chest pain, palpitation, orthopnea or leg edema.  Denied cough, wheezing or hemoptysis.  Denied nausea, vomiting or abdominal pain.  Denies any constipation or diarrhea.  Denies any frequency urgency or hesitancy.  Denies any arthralgias or myalgias.  Denies any skin rashes or lesions.  Denies any bleeding or clotting tendency.  Denies any easy  bruising.  Denies any hair or nail changes.  Denies any anxiety or depression.  Remaining review of system is negative.                       Medications: I have reviewed the patient's current medications.  Current Outpatient Medications:  .  acetaminophen (TYLENOL) 325 MG tablet, Take 1-2 tablets (325-650 mg total) by mouth every 4 (four) hours as needed for mild pain., Disp:  , Rfl:  .  acyclovir (ZOVIRAX) 400 MG tablet, Take 400 mg by mouth at bedtime., Disp: , Rfl:  .  albuterol (PROAIR HFA) 108 (90 Base) MCG/ACT inhaler, Inhale two puffs every 4-6 hours if needed for cough or wheeze, Disp: 1 Inhaler, Rfl: 1 .  amLODipine (NORVASC) 2.5 MG tablet, Take 1 tablet (2.5 mg total) by mouth at bedtime. (Patient taking differently: Take 2.5 mg by mouth at bedtime. ON HOLD PER WIFE), Disp: 30 tablet, Rfl: 0 .  calcium carbonate (TUMS - DOSED IN MG ELEMENTAL CALCIUM) 500 MG chewable tablet, Chew 2 tablets by mouth at bedtime., Disp: , Rfl:  .  camphor-menthol (SARNA) lotion, Apply 1 application topically every 8 (eight) hours as needed for itching., Disp: 222 mL, Rfl: 0 .  Cetirizine HCl 10 MG CAPS, Take 1 capsule by mouth daily., Disp: , Rfl:  .  cinacalcet (SENSIPAR) 60 MG tablet, Take 60 mg by mouth every evening. ON HOLD FOR DIALYSIS PER WIFE, Disp: , Rfl:  .  ciprofloxacin (CIPRO) 500 MG tablet, Take 500 mg by mouth every evening. For 120 days, Disp: , Rfl:  .  clonazePAM (KLONOPIN) 0.5 MG tablet, Take 2 tablets every Monday, Wednesday, and Friday with hemodialysis, Disp: 30 tablet, Rfl: 5 .  fluticasone furoate-vilanterol (BREO ELLIPTA) 200-25 MCG/INH AEPB, Inhale 1 puff into the lungs daily., Disp: , Rfl:  .  hydrOXYzine (ATARAX/VISTARIL) 25 MG tablet, Take 1 tablet (25 mg total) by mouth every 8 (eight) hours as needed for itching., Disp: 30 tablet, Rfl: 0 .  lacosamide (VIMPAT) 200 MG TABS tablet, Take 1 tablet twice a day except on dialysis days M-W-F give 1 tab in AM, 1 tab  after dialysis, 1 tab in PM., Disp: 90 tablet, Rfl: 5 .  lamoTRIgine (LAMICTAL) 150 MG tablet, Take 1 tablet (150 mg total) by mouth 2 (two) times daily., Disp: 180 tablet, Rfl: 3 .  metoprolol tartrate (LOPRESSOR) 25 MG tablet, Take 12.5 mg by mouth 2 (two) times daily., Disp: , Rfl:  .  multivitamin (RENA-VIT) TABS tablet, Take 1 tablet by mouth at bedtime., Disp: , Rfl:  .  pantoprazole (PROTONIX) 40 MG tablet, Take 40 mg by mouth daily., Disp: , Rfl:  .  prochlorperazine (COMPAZINE) 10 MG tablet, TAKE 1 TABLET (10 MG TOTAL) BY MOUTH EVERY 6 (SIX) HOURS AS NEEDED FOR NAUSEA OR VOMITING., Disp: 30 tablet, Rfl: 0  Allergies:  Allergies  Allergen Reactions  . Codeine Rash and Other (See Comments)    Unknown reaction (patient says it was more serious than just a rash, but he can't remember what happened) Unknown reaction (patient says it was more serious than just a rash, but he can't remember what happened)  . Codeine Swelling    Past Medical History, Surgical history, Social history, and Family History without any changes in review.  Physical Exam:   Blood pressure (!) 147/65, pulse 64, temperature 98.8 F (37.1 C), temperature source Temporal, resp. rate 18, height 6' (1.829 m), weight 187 lb 6.4 oz (85 kg), SpO2 98 %.     ECOG: 1    General appearance: Alert, awake without any distress. Head: Atraumatic without abnormalities Oropharynx: Without any thrush or ulcers. Eyes: No scleral icterus. Lymph nodes: No lymphadenopathy noted in the cervical, supraclavicular, or axillary nodes Heart:regular rate and rhythm, without any murmurs or gallops.   Lung: Clear to auscultation without any rhonchi, wheezes or dullness to percussion. Abdomin: Soft, nontender without any shifting dullness or ascites. Musculoskeletal: No clubbing or cyanosis. Neurological: No motor or sensory deficits. Skin: No rashes or lesions.               Lab Results: Lab Results  Component  Value Date   WBC 5.6 09/06/2019   HGB 10.7 (L) 09/06/2019   HCT RESULTS UNAVAILABLE DUE TO INTERFERING SUBSTANCE 09/06/2019   MCV 91.9 09/06/2019   PLT 178 09/06/2019     Chemistry      Component Value Date/Time   NA 139 08/30/2019 1113   NA 138 07/03/2017 0813   K 4.6 08/30/2019 1113   K 4.2 07/03/2017 0813   CL 94 (L) 08/30/2019 1113   CO2 32 08/30/2019 1113   CO2 27 07/03/2017 0813   BUN 36 (H) 08/30/2019 1113   BUN 44 (A) 08/17/2019 0000   BUN 46.4 (H) 07/03/2017 0813   CREATININE 8.67 (HH) 08/30/2019 1113   CREATININE 10.9 (HH) 07/03/2017 0813   GLU 145 08/17/2019 0000      Component Value Date/Time   CALCIUM 9.1 08/30/2019 1113   CALCIUM 9.3 07/03/2017 0813   ALKPHOS 78 08/30/2019 1113   ALKPHOS 77 07/03/2017 0813  AST 16 08/30/2019 1113   AST 16 07/03/2017 0813   ALT 7 08/30/2019 1113   ALT 14 07/03/2017 0813   BILITOT 0.8 08/30/2019 1113   BILITOT 0.93 07/03/2017 0813        Impression and Plan:  53 year old man with:  1.  IgG kappa multiple myeloma developing from MGUS diagnosed in 2019.    He is status post therapy outlined above and currently receiving salvage therapy utilizing daratumumab, Velcade and dexamethasone.  He has tolerated the first week of injection without any complications.  Risks and benefits of continuing this therapy was outlined today he is agreeable to continue.  He still under consideration for possible high-dose chemotherapy and stem cell transplant in the future.  His M protein detected in December 2020 remains close to undetectable indicating very good partial response.     2. Anemia: Related to renal insufficiency as well as malignancy.  Hemoglobin is adequate at this time.   2.  Antiemetics: No nausea or vomiting reported at this time.  Antiemetics available to him at this time.  4.  Renal failure: Continues to be hemodialysis dependent.  5.  Weakness and debilitation: Improved at this time currently continues participate  in physical therapy and rehabilitation process.  6.  Goals of care: His disease remains incurable although aggressive measures are warranted given his reasonable age and performance status.   7. Follow-up: Weekly for chemotherapy treatment and MD follow-up in 4 weeks.  25  minutes was spent with the patient face-to-face today.  More than 50% of time was dedicated to reviewing his laboratory data, updating his disease status and addressing complications related to his current and future therapy.     Zola Button, MD 12/29/20209:41 AM

## 2019-09-06 NOTE — Patient Instructions (Signed)
[facility] Discharge Instructions for Patients Receiving Chemotherapy  PICK UP DECADRON FROM [facility] AND FOLLOW DIRECTIONS ON BOTTLE FOR NEXT CHEMO CYCLE. (he already had it today so don't repeat this week).  Today you received the following chemotherapy agents Darzalex Faspro and Velcade. To help prevent nausea and vomiting after your treatment, we encourage you to take your nausea medication as directed. If you develop nausea and vomiting that is not controlled by your nausea medication, call the clinic.   BELOW ARE SYMPTOMS THAT SHOULD BE REPORTED IMMEDIATELY:  *FEVER GREATER THAN 100.5 F  *CHILLS WITH OR WITHOUT FEVER  NAUSEA AND VOMITING THAT IS NOT CONTROLLED WITH YOUR NAUSEA MEDICATION  *UNUSUAL SHORTNESS OF BREATH  *UNUSUAL BRUISING OR BLEEDING  TENDERNESS IN MOUTH AND THROAT WITH OR WITHOUT PRESENCE OF ULCERS  *URINARY PROBLEMS  *BOWEL PROBLEMS  UNUSUAL RASH Items with * indicate a potential emergency and should be followed up as soon as possible.  Feel free to call the clinic you have any questions or concerns. The clinic phone number is [phone].  Please show the [place] at check-in to the Emergency Department and triage nurse.

## 2019-09-06 NOTE — Telephone Encounter (Signed)
Lab called with Scr 7.81. Dr. Alen Blew notified. Per Dr. Alen Blew, ok to treat with labs from today.

## 2019-09-07 ENCOUNTER — Telehealth: Payer: Self-pay | Admitting: Oncology

## 2019-09-07 DIAGNOSIS — N2581 Secondary hyperparathyroidism of renal origin: Secondary | ICD-10-CM | POA: Diagnosis not present

## 2019-09-07 DIAGNOSIS — D631 Anemia in chronic kidney disease: Secondary | ICD-10-CM | POA: Diagnosis not present

## 2019-09-07 DIAGNOSIS — N186 End stage renal disease: Secondary | ICD-10-CM | POA: Diagnosis not present

## 2019-09-07 DIAGNOSIS — E1129 Type 2 diabetes mellitus with other diabetic kidney complication: Secondary | ICD-10-CM | POA: Diagnosis not present

## 2019-09-07 DIAGNOSIS — Z992 Dependence on renal dialysis: Secondary | ICD-10-CM | POA: Diagnosis not present

## 2019-09-07 DIAGNOSIS — D509 Iron deficiency anemia, unspecified: Secondary | ICD-10-CM | POA: Diagnosis not present

## 2019-09-07 NOTE — Telephone Encounter (Signed)
Scheduled appt per 12/29 los

## 2019-09-08 DIAGNOSIS — N186 End stage renal disease: Secondary | ICD-10-CM | POA: Diagnosis not present

## 2019-09-08 DIAGNOSIS — I120 Hypertensive chronic kidney disease with stage 5 chronic kidney disease or end stage renal disease: Secondary | ICD-10-CM | POA: Diagnosis not present

## 2019-09-08 DIAGNOSIS — E1122 Type 2 diabetes mellitus with diabetic chronic kidney disease: Secondary | ICD-10-CM | POA: Diagnosis not present

## 2019-09-08 DIAGNOSIS — I69351 Hemiplegia and hemiparesis following cerebral infarction affecting right dominant side: Secondary | ICD-10-CM | POA: Diagnosis not present

## 2019-09-08 DIAGNOSIS — I69320 Aphasia following cerebral infarction: Secondary | ICD-10-CM | POA: Diagnosis not present

## 2019-09-08 DIAGNOSIS — C9000 Multiple myeloma not having achieved remission: Secondary | ICD-10-CM | POA: Diagnosis not present

## 2019-09-08 NOTE — Addendum Note (Signed)
Addended by: Bayard Hugger on: 09/08/2019 03:29 PM   Modules accepted: Orders

## 2019-09-09 DIAGNOSIS — Z992 Dependence on renal dialysis: Secondary | ICD-10-CM | POA: Diagnosis not present

## 2019-09-09 DIAGNOSIS — E1122 Type 2 diabetes mellitus with diabetic chronic kidney disease: Secondary | ICD-10-CM | POA: Diagnosis not present

## 2019-09-09 DIAGNOSIS — N186 End stage renal disease: Secondary | ICD-10-CM | POA: Diagnosis not present

## 2019-09-09 DIAGNOSIS — F22 Delusional disorders: Secondary | ICD-10-CM

## 2019-09-09 HISTORY — DX: Delusional disorders: F22

## 2019-09-09 HISTORY — PX: CATARACT EXTRACTION: SUR2

## 2019-09-10 DIAGNOSIS — D509 Iron deficiency anemia, unspecified: Secondary | ICD-10-CM | POA: Diagnosis not present

## 2019-09-10 DIAGNOSIS — D631 Anemia in chronic kidney disease: Secondary | ICD-10-CM | POA: Diagnosis not present

## 2019-09-10 DIAGNOSIS — N2581 Secondary hyperparathyroidism of renal origin: Secondary | ICD-10-CM | POA: Diagnosis not present

## 2019-09-10 DIAGNOSIS — Z992 Dependence on renal dialysis: Secondary | ICD-10-CM | POA: Diagnosis not present

## 2019-09-10 DIAGNOSIS — N186 End stage renal disease: Secondary | ICD-10-CM | POA: Diagnosis not present

## 2019-09-10 DIAGNOSIS — E1129 Type 2 diabetes mellitus with other diabetic kidney complication: Secondary | ICD-10-CM | POA: Diagnosis not present

## 2019-09-12 DIAGNOSIS — E1129 Type 2 diabetes mellitus with other diabetic kidney complication: Secondary | ICD-10-CM | POA: Diagnosis not present

## 2019-09-12 DIAGNOSIS — D631 Anemia in chronic kidney disease: Secondary | ICD-10-CM | POA: Diagnosis not present

## 2019-09-12 DIAGNOSIS — N186 End stage renal disease: Secondary | ICD-10-CM | POA: Diagnosis not present

## 2019-09-12 DIAGNOSIS — Z992 Dependence on renal dialysis: Secondary | ICD-10-CM | POA: Diagnosis not present

## 2019-09-12 DIAGNOSIS — D509 Iron deficiency anemia, unspecified: Secondary | ICD-10-CM | POA: Diagnosis not present

## 2019-09-12 DIAGNOSIS — N2581 Secondary hyperparathyroidism of renal origin: Secondary | ICD-10-CM | POA: Diagnosis not present

## 2019-09-12 MED FILL — KETOROLAC 0.5% OPHTH SOLN: 0.5 | 15 days supply | Qty: 3 | Fill #2

## 2019-09-13 ENCOUNTER — Encounter: Payer: Self-pay | Admitting: Physical Medicine & Rehabilitation

## 2019-09-13 ENCOUNTER — Telehealth: Payer: Self-pay

## 2019-09-13 ENCOUNTER — Inpatient Hospital Stay: Payer: Medicare Other

## 2019-09-13 ENCOUNTER — Inpatient Hospital Stay: Payer: Medicare Other | Attending: Oncology

## 2019-09-13 ENCOUNTER — Other Ambulatory Visit: Payer: Self-pay

## 2019-09-13 ENCOUNTER — Telehealth: Payer: Self-pay | Admitting: Neurology

## 2019-09-13 ENCOUNTER — Encounter: Payer: Medicare Other | Attending: Physical Medicine & Rehabilitation | Admitting: Physical Medicine & Rehabilitation

## 2019-09-13 VITALS — BP 111/63 | HR 75 | Temp 98.3°F | Resp 16 | Wt 179.8 lb

## 2019-09-13 VITALS — Ht 70.0 in | Wt 162.0 lb

## 2019-09-13 DIAGNOSIS — Z5112 Encounter for antineoplastic immunotherapy: Secondary | ICD-10-CM | POA: Insufficient documentation

## 2019-09-13 DIAGNOSIS — I69159 Hemiplegia and hemiparesis following nontraumatic intracerebral hemorrhage affecting unspecified side: Secondary | ICD-10-CM

## 2019-09-13 DIAGNOSIS — S065X9A Traumatic subdural hemorrhage with loss of consciousness of unspecified duration, initial encounter: Secondary | ICD-10-CM | POA: Insufficient documentation

## 2019-09-13 DIAGNOSIS — E1165 Type 2 diabetes mellitus with hyperglycemia: Secondary | ICD-10-CM | POA: Diagnosis not present

## 2019-09-13 DIAGNOSIS — R269 Unspecified abnormalities of gait and mobility: Secondary | ICD-10-CM | POA: Insufficient documentation

## 2019-09-13 DIAGNOSIS — Z86718 Personal history of other venous thrombosis and embolism: Secondary | ICD-10-CM

## 2019-09-13 DIAGNOSIS — G40909 Epilepsy, unspecified, not intractable, without status epilepticus: Secondary | ICD-10-CM | POA: Diagnosis not present

## 2019-09-13 DIAGNOSIS — S065XAA Traumatic subdural hemorrhage with loss of consciousness status unknown, initial encounter: Secondary | ICD-10-CM

## 2019-09-13 DIAGNOSIS — G8110 Spastic hemiplegia affecting unspecified side: Secondary | ICD-10-CM | POA: Insufficient documentation

## 2019-09-13 DIAGNOSIS — G40209 Localization-related (focal) (partial) symptomatic epilepsy and epileptic syndromes with complex partial seizures, not intractable, without status epilepticus: Secondary | ICD-10-CM

## 2019-09-13 DIAGNOSIS — Z86711 Personal history of pulmonary embolism: Secondary | ICD-10-CM | POA: Diagnosis not present

## 2019-09-13 DIAGNOSIS — N186 End stage renal disease: Secondary | ICD-10-CM | POA: Diagnosis not present

## 2019-09-13 DIAGNOSIS — I4891 Unspecified atrial fibrillation: Secondary | ICD-10-CM | POA: Diagnosis not present

## 2019-09-13 DIAGNOSIS — E1122 Type 2 diabetes mellitus with diabetic chronic kidney disease: Secondary | ICD-10-CM

## 2019-09-13 DIAGNOSIS — E1065 Type 1 diabetes mellitus with hyperglycemia: Secondary | ICD-10-CM | POA: Diagnosis not present

## 2019-09-13 DIAGNOSIS — Z992 Dependence on renal dialysis: Secondary | ICD-10-CM | POA: Diagnosis not present

## 2019-09-13 DIAGNOSIS — C9000 Multiple myeloma not having achieved remission: Secondary | ICD-10-CM

## 2019-09-13 DIAGNOSIS — R7881 Bacteremia: Secondary | ICD-10-CM | POA: Insufficient documentation

## 2019-09-13 HISTORY — DX: Unspecified abnormalities of gait and mobility: R26.9

## 2019-09-13 LAB — CMP (CANCER CENTER ONLY)
ALT: 9 U/L (ref 0–44)
AST: 13 U/L — ABNORMAL LOW (ref 15–41)
Albumin: 4.2 g/dL (ref 3.5–5.0)
Alkaline Phosphatase: 78 U/L (ref 38–126)
Anion gap: 14 (ref 5–15)
BUN: 35 mg/dL — ABNORMAL HIGH (ref 6–20)
CO2: 32 mmol/L (ref 22–32)
Calcium: 9.7 mg/dL (ref 8.9–10.3)
Chloride: 96 mmol/L — ABNORMAL LOW (ref 98–111)
Creatinine: 8.75 mg/dL (ref 0.61–1.24)
GFR, Est AFR Am: 7 mL/min — ABNORMAL LOW (ref >60)
GFR, Estimated: 6 mL/min — ABNORMAL LOW (ref >60)
Glucose, Bld: 167 mg/dL — ABNORMAL HIGH (ref 70–99)
Potassium: 4.3 mmol/L (ref 3.5–5.1)
Sodium: 142 mmol/L (ref 135–145)
Total Bilirubin: 0.8 mg/dL (ref 0.3–1.2)
Total Protein: 7.1 g/dL (ref 6.5–8.1)

## 2019-09-13 LAB — CBC WITH DIFFERENTIAL (CANCER CENTER ONLY)
Abs Immature Granulocytes: 0.01 10*3/uL (ref 0.00–0.07)
Basophils Absolute: 0.1 10*3/uL (ref 0.0–0.1)
Basophils Relative: 1 %
Eosinophils Absolute: 0.2 10*3/uL (ref 0.0–0.5)
Eosinophils Relative: 4 %
Hemoglobin: 11.6 g/dL — ABNORMAL LOW (ref 13.0–17.0)
Immature Granulocytes: 0 %
Lymphocytes Relative: 6 %
Lymphs Abs: 0.3 10*3/uL — ABNORMAL LOW (ref 0.7–4.0)
Monocytes Absolute: 0.7 10*3/uL (ref 0.1–1.0)
Monocytes Relative: 15 %
Neutro Abs: 3.7 10*3/uL (ref 1.7–7.7)
Neutrophils Relative %: 74 %
Platelet Count: 188 10*3/uL (ref 150–400)
WBC Count: 5 10*3/uL (ref 4.0–10.5)
nRBC: 0.6 % — ABNORMAL HIGH (ref 0.0–0.2)

## 2019-09-13 MED ORDER — DIPHENHYDRAMINE HCL 25 MG PO CAPS
50.0000 mg | ORAL_CAPSULE | Freq: Once | ORAL | Status: AC
  Administered 2019-09-13: 50 mg via ORAL

## 2019-09-13 MED ORDER — DARATUMUMAB-HYALURONIDASE-FIHJ 1800-30000 MG-UT/15ML ~~LOC~~ SOLN
1800.0000 mg | Freq: Once | SUBCUTANEOUS | Status: AC
  Administered 2019-09-13: 1800 mg via SUBCUTANEOUS
  Filled 2019-09-13: qty 15

## 2019-09-13 MED ORDER — BORTEZOMIB CHEMO SQ INJECTION 3.5 MG (2.5MG/ML)
1.5000 mg/m2 | Freq: Once | INTRAMUSCULAR | Status: AC
  Administered 2019-09-13: 3 mg via SUBCUTANEOUS
  Filled 2019-09-13: qty 1.2

## 2019-09-13 MED ORDER — ACETAMINOPHEN 325 MG PO TABS
650.0000 mg | ORAL_TABLET | Freq: Once | ORAL | Status: AC
  Administered 2019-09-13: 650 mg via ORAL

## 2019-09-13 MED ORDER — DIPHENHYDRAMINE HCL 25 MG PO CAPS
ORAL_CAPSULE | ORAL | Status: AC
  Filled 2019-09-13: qty 2

## 2019-09-13 MED ORDER — ACETAMINOPHEN 325 MG PO TABS
ORAL_TABLET | ORAL | Status: AC
  Filled 2019-09-13: qty 2

## 2019-09-13 MED ORDER — PROCHLORPERAZINE MALEATE 10 MG PO TABS
ORAL_TABLET | ORAL | Status: AC
  Filled 2019-09-13: qty 1

## 2019-09-13 MED ORDER — PROCHLORPERAZINE MALEATE 10 MG PO TABS
10.0000 mg | ORAL_TABLET | Freq: Once | ORAL | Status: AC
  Administered 2019-09-13: 10 mg via ORAL

## 2019-09-13 MED ORDER — MONTELUKAST SODIUM 10 MG PO TABS
ORAL_TABLET | ORAL | Status: AC
  Filled 2019-09-13: qty 1

## 2019-09-13 MED ORDER — MONTELUKAST SODIUM 10 MG PO TABS
10.0000 mg | ORAL_TABLET | Freq: Once | ORAL | Status: AC
  Administered 2019-09-13: 10 mg via ORAL

## 2019-09-13 NOTE — Telephone Encounter (Signed)
Lab called with Scr 8.75. Per Dr. Alen Blew, ok to treat.

## 2019-09-13 NOTE — Telephone Encounter (Signed)
Patient's wife is calling in about an episode he had on Saturday morning- just before she was leaving to take him to dialysis and it wasn't a seizure. She is wanting to speak with a nurse. She said it was too much to have to relay. Thanks!

## 2019-09-13 NOTE — Progress Notes (Signed)
Pt. states he took him Decadron 20 mg oral dose at home. This is pt. 3rd dose of Darzalex/Fastpro injection and he tolerated other doses without difficulty. Per Dr. Alen Blew, no post observation required. Vital signs taken after Darzalex and remain stable. Pt. Discharged.

## 2019-09-13 NOTE — Patient Instructions (Addendum)
[facility] Discharge Instructions for Patients Receiving Chemotherapy  Today you received the following chemotherapy agents: Darzalex Faspro and Velcade.  To help prevent nausea and vomiting after your treatment, we encourage you to take your nausea medication as directed. If you develop nausea and vomiting that is not controlled by your nausea medication, call the clinic.   BELOW ARE SYMPTOMS THAT SHOULD BE REPORTED IMMEDIATELY:  *FEVER GREATER THAN 100.5 F  *CHILLS WITH OR WITHOUT FEVER  NAUSEA AND VOMITING THAT IS NOT CONTROLLED WITH YOUR NAUSEA MEDICATION  *UNUSUAL SHORTNESS OF BREATH  *UNUSUAL BRUISING OR BLEEDING  TENDERNESS IN MOUTH AND THROAT WITH OR WITHOUT PRESENCE OF ULCERS  *URINARY PROBLEMS  *BOWEL PROBLEMS  UNUSUAL RASH Items with * indicate a potential emergency and should be followed up as soon as possible.  Feel free to call the clinic you have any questions or concerns. The clinic phone number is [phone].  Please show the [facility] at check-in to the Emergency Department and triage nurse.  Coronavirus (COVID-19) Are you at risk?  Are you at risk for the Coronavirus (COVID-19)?  To be considered HIGH RISK for Coronavirus (COVID-19), you have to meet the following criteria:  . Traveled to [place], [place], [place], [place] or [place]; or in the [place] to [place], [place], [place], or [place]; and have fever, cough, and shortness of breath within the last 2 weeks of travel OR . Been in close contact with a person diagnosed with COVID-19 within the last 2 weeks and have fever, cough, and shortness of breath . IF YOU DO NOT MEET THESE CRITERIA, YOU ARE CONSIDERED LOW RISK FOR COVID-19.  What to do if you are HIGH RISK for COVID-19?  [name] If you are having a medical emergency, call 911. . Seek medical care right away. Before you go to a doctor's office, urgent care or emergency department, call ahead and tell them about  your recent travel, contact with someone diagnosed with COVID-19, and your symptoms. You should receive instructions from your physician's office regarding next steps of care.  . When you arrive at healthcare provider, tell the healthcare staff immediately you have returned from visiting [place], [place], [place], [place] or [place]; or traveled in the [place] to [place], [place], [place], or [place]; in the last two weeks or you have been in close contact with a person diagnosed with COVID-19 in the last 2 weeks.   . Tell the health care staff about your symptoms: fever, cough and shortness of breath. . After you have been seen by a medical provider, you will be either: o Tested for (COVID-19) and discharged home on quarantine except to seek medical care if symptoms worsen, and asked to  - Stay home and avoid contact with others until you get your results (4-5 days)  - Avoid travel on public transportation if possible (such as bus, train, or airplane) or o Sent to the Emergency Department by EMS for evaluation, COVID-19 testing, and possible admission depending on your condition and test results.  What to do if you are LOW RISK for COVID-19?  Reduce your risk of any infection by using the same precautions used for avoiding the common cold or flu:  [name] Wash your hands often with soap and warm water for at least 20 seconds.  If soap and water are not readily available, use an alcohol-based hand sanitizer with at least 60% alcohol.  . If coughing or sneezing,  cover your mouth and nose by coughing or sneezing into the elbow areas of your shirt or coat, into a tissue or into your sleeve (not your hands). . Avoid shaking hands with others and consider head nods or verbal greetings only. . Avoid touching your eyes, nose, or mouth with unwashed hands.  . Avoid close contact with people who are sick. . Avoid places or events with large numbers of people in one location, like concerts or sporting  events. . Carefully consider travel plans you have or are making. . If you are planning any travel outside or inside the [place], visit the CDC's Travelers' Health webpage for the latest health notices. . If you have some symptoms but not all symptoms, continue to monitor at home and seek medical attention if your symptoms worsen. . If you are having a medical emergency, call 911.   [facility] / e-Visit: [email or web address]         [facility]: [facility]: [phone]                   [facility]: [phone]

## 2019-09-13 NOTE — Telephone Encounter (Signed)
Received Critical call from lab. Patients Creatine 8.75 [name]'s nurse Marita Kansas RN made aware

## 2019-09-13 NOTE — Progress Notes (Signed)
Subjective:    Patient ID: [place], male    DOB: [birth year], 54 y.o.   MRN: [id number]  TELEHEALTH NOTE  Due to national recommendations of social distancing due to COVID 19, an audio/video telehealth visit is felt to be most appropriate for this patient at this time.  See Chart message from today for the patient's consent to telehealth from [place].     I verified that I am speaking with the correct person using two identifiers.  Location of patient: Home Location of provider: Office Method of communication: WebEx Names of participants : Zorita Pang scheduling, [employer] obtaining consent and vitals if available Established patient Time spent on call: 1132   HPI Male with history of T2DM, A fib, ESRD-HD MWF, CVA, DVT/PE s/p IVC filter, seizure disorder, MM with recent admission to [facility] for stem cell transplant but procedure aborted due to findings of proteus mirabilis bacteremia, aspiration PNA, acute on chronic SDH, findings of bilateral renal calculi as well as breakthrough seizures presents for follow up for SDH.  Last clinic visit on 07/12/2019.  Since that time, patient saw neurology, notes reviewed-antiepileptics were increased, CT was ordered.  Patient also saw Heme/Onc with adjustment to his chemotherapy meds.  Patient was also seen by our NP, notes reviewed-transfer chair was ordered after communication.  Wife supplements history.  Since that time, patient states he is still in therapies.  He did not follow up with Neurosurg.  He is still on Cipro. Wife notes episodes of jerking, despite increasing anti-epileptics.  He had a fall, without head trauma because he did not seek assistance for mobility.   Pain Inventory Average Pain 0 Pain Right Now 0 My pain is na  In the last 24 hours, has pain interfered with the following? General activity 0 Relation with others 0 Enjoyment of life 0 What TIME of day is your pain at its  worst? na Sleep (in general) Good  Pain is worse with: na Pain improves with: na Relief from Meds: 0  Mobility walk with assistance use a walker ability to climb steps?  yes do you drive?  no use a wheelchair needs help with transfers  Function disabled: date disabled . retired I need assistance with the following:  dressing, bathing, toileting, meal prep, household duties and shopping  Neuro/Psych weakness trouble walking confusion  Prior Studies Any changes since last visit?  no  Physicians involved in your care Any changes since last visit?  no   Family History  Problem Relation Age of Onset  . Hypertension Mother   . Hypertension Father   . Stroke Father   . Sickle cell anemia Brother   . Diabetes Mother   . Sickle cell anemia Brother   . Diabetes type I Brother   . Kidney disease Brother    Social History   Socioeconomic History  . Marital status: Married    Spouse name: sylvia  . Number of children: 1  . Years of education: college  . Highest education level: Not on file  Occupational History  . Occupation: [employer]  . Occupation: disability  Tobacco Use  . Smoking status: Never Smoker  . Smokeless tobacco: Never Used  Substance and Sexual Activity  . Alcohol use: Not Currently  . Drug use: Never  . Sexual activity: Yes    Partners: Female  Other Topics Concern  . Not on file  Social History Narrative   ** Merged History Encounter **  Pt lives in 2 story home with his wife and 1 son Has masters degree in psychology Currently disabled.     Social Determinants of Health   Financial Resource Strain: Low Risk   . Difficulty of Paying Living Expenses: Not hard at all  Food Insecurity: No Food Insecurity  . Worried About Charity fundraiser in the Last Year: Never true  . Ran Out of Food in the Last Year: Never true  Transportation Needs: No Transportation Needs  . Lack of Transportation (Medical): No  . Lack of  Transportation (Non-Medical): No  Physical Activity: Inactive  . Days of Exercise per Week: 0 days  . Minutes of Exercise per Session: 0 min  Stress: No Stress Concern Present  . Feeling of Stress : Not at all  Social Connections:   . Frequency of Communication with Friends and Family: Not on file  . Frequency of Social Gatherings with Friends and Family: Not on file  . Attends Religious Services: Not on file  . Active Member of Clubs or Organizations: Not on file  . Attends Archivist Meetings: Not on file  . Marital Status: Not on file   Past Surgical History:  Procedure Laterality Date  . AV FISTULA PLACEMENT Right 12/05/2015   Procedure: INSERTION OF ARTERIOVENOUS (AV) GORE-TEX GRAFT ARM;  Surgeon: Elam Dutch, MD;  Location: [facility];  Service: Vascular;  Laterality: Right;  . BONE MARROW BIOPSY    . CHOLECYSTECTOMY    . CHOLECYSTECTOMY  1990's?  . COLONOSCOPY WITH PROPOFOL N/A 01/22/2017   Procedure: COLONOSCOPY WITH PROPOFOL;  Surgeon: Wilford Corner, MD;  Location: [facility];  Service: Endoscopy;  Laterality: N/A;  . EYE SURGERY Right   . INSERTION OF DIALYSIS CATHETER Right 10/16/2015   Procedure: INSERTION OF PALINDROME DIALYSIS CATHETER ;  Surgeon: Elam Dutch, MD;  Location: [facility];  Service: Vascular;  Laterality: Right;  . OTHER SURGICAL HISTORY     Retinal surgery  . PARS PLANA VITRECTOMY  02/17/2012   Procedure: PARS PLANA VITRECTOMY WITH 25 GAUGE;  Surgeon: Hayden Pedro, MD;  Location: [facility];  Service: Ophthalmology;  Laterality: Right;  . PERIPHERAL VASCULAR CATHETERIZATION N/A 03/20/2016   Procedure: A/V Shuntogram/Fistulagram;  Surgeon: Conrad Keewatin, MD;  Location: [facility];  Service: Cardiovascular;  Laterality: N/A;  . TEE WITHOUT CARDIOVERSION N/A 12/02/2016   Procedure: TRANSESOPHAGEAL ECHOCARDIOGRAM (TEE);  Surgeon: Larey Dresser, MD;  Location: [facility];  Service: Cardiovascular;  Laterality: N/A;   Past Medical History:    Diagnosis Date  . A-fib ([place])   . Anemia   . Asthma   . Diabetes mellitus without complication ([place])   . DM type 2 (diabetes mellitus, type 2) ([place]) 06/09/2019  . ESRD (end stage renal disease) on dialysis ([place])   . Essential hypertension 06/09/2019  . GIB (gastrointestinal bleeding)    Recurrent episodes- 09/2014, 09/2015 and 05/2016  . Gout   . History of recent blood transfusion 10/27/14   2 Units PRBC's  . Hyperkalemia 07/2011  . Hypertension   . Monoclonal gammopathy   . Multiple myeloma ([place])   . OSA on CPAP   . Pulmonary emboli ([place]) 07/2011   bilateral--treated with comadin X 6 months  . Pulmonary embolism ([place]) 07/2011; 09/27/2014   a. Bilat PE 07/2011 - unclear cause, tx with 6 months Coumadin.;   . Seizure disorder ([place]) 06/09/2019  . Seizures ([place])   . Sepsis ([place])   . Sickle cell-thalassemia disease (  [place])    a. Sickle cell trait.  . Sleep apnea   . Stroke ([place])   . Stroke ([place]) 09/2015   R-MCA, L-MCA, PCA and bilateral cerebellar complicated by DVT/PE  . Subdural hematoma ([place]) 05/2019   Ht 5' 10"  (1.778 m) Comment: last recorded  Wt 162 lb (73.5 kg) Comment: last recorded  BMI 23.24 kg/m   Opioid Risk Score:   Fall Risk Score:  `1  Depression screen PHQ 2/9  Depression screen [facility] 2/9 09/13/2019 03/17/2019 08/19/2018  Decreased Interest 0 0 0  Down, Depressed, Hopeless 0 0 0  PHQ - 2 Score 0 0 0  Altered sleeping - 0 -  Tired, decreased energy - 0 -  Change in appetite - 0 -  Feeling bad or failure about yourself  - 0 -  Trouble concentrating - 0 -  Moving slowly or fidgety/restless - 1 -  Suicidal thoughts - 0 -  PHQ-9 Score - 1 -  Difficult doing work/chores - Not difficult at all -  Some recent data might be hidden     Review of Systems  Constitutional: Negative.   HENT: Negative.   Eyes: Negative.   Respiratory: Negative.   Cardiovascular: Negative.   Gastrointestinal: Negative.   Endocrine: Negative.   Genitourinary:       Dialysis M W F   Musculoskeletal: Positive for gait problem.  Skin: Negative.   Allergic/Immunologic: Negative.   Neurological: Positive for weakness.  Hematological: Negative.   Psychiatric/Behavioral: Positive for confusion.      Objective:   Physical Exam  Constitutional: No distress .  HENT: Normocephalic.  Atraumatic. Eyes: EOMI. No discharge. Cardiovascular: No JVD. Respiratory: Normal effort.  No stridor. Psych: Flat. Neurologic: Alert Dysarthria Facial weakness    Assessment & Plan:  Male with history of T2DM, A fib, ESRD-HD MWF, CVA, DVT/PE s/p IVC filter, seizure disorder, MM with recent admission to [facility] for stem cell transplant but procedure aborted due to findings of proteus mirabilis bacteremia, aspiration PNA, acute on chronic SDH, findings of bilateral renal calculi as well as breakthrough seizures presents for follow up for SDH.  1. Impaired ADLs/mobility and function secondary to acute on chronic SDH with 5 mm of midline shift to left- with R hemiparesis and dysarthria- f/u with NSU after rehab; no head CT unless has a change in function, per NSU  Continue therapies  Cont follow up with Neurology - encouraged follow up due to seizure like activity   2. Proteus bacteremia/Recent aspiration PNA:   Cont Cipro until end of Jan  3. Seizure disorder:   Continues to have breakthrough seizures per wife  Cont follow up with Neurology  4. T2DM with hyperglycemia:   Running high, encouraged follow up PCP  5. Spasticity in setting of 2017 CVA with previous R hemiparesis  Labile per patient  6. Gait abnormality  Cont walker for safety  Cont therapies

## 2019-09-14 DIAGNOSIS — Z992 Dependence on renal dialysis: Secondary | ICD-10-CM | POA: Diagnosis not present

## 2019-09-14 DIAGNOSIS — G8110 Spastic hemiplegia affecting unspecified side: Secondary | ICD-10-CM | POA: Diagnosis not present

## 2019-09-14 DIAGNOSIS — D509 Iron deficiency anemia, unspecified: Secondary | ICD-10-CM | POA: Diagnosis not present

## 2019-09-14 DIAGNOSIS — D631 Anemia in chronic kidney disease: Secondary | ICD-10-CM | POA: Diagnosis not present

## 2019-09-14 DIAGNOSIS — R269 Unspecified abnormalities of gait and mobility: Secondary | ICD-10-CM | POA: Diagnosis not present

## 2019-09-14 DIAGNOSIS — R2689 Other abnormalities of gait and mobility: Secondary | ICD-10-CM | POA: Diagnosis not present

## 2019-09-14 DIAGNOSIS — N186 End stage renal disease: Secondary | ICD-10-CM | POA: Diagnosis not present

## 2019-09-14 DIAGNOSIS — F445 Conversion disorder with seizures or convulsions: Secondary | ICD-10-CM | POA: Diagnosis not present

## 2019-09-14 DIAGNOSIS — E1129 Type 2 diabetes mellitus with other diabetic kidney complication: Secondary | ICD-10-CM | POA: Diagnosis not present

## 2019-09-14 DIAGNOSIS — G40909 Epilepsy, unspecified, not intractable, without status epilepticus: Secondary | ICD-10-CM | POA: Diagnosis not present

## 2019-09-14 DIAGNOSIS — N2581 Secondary hyperparathyroidism of renal origin: Secondary | ICD-10-CM | POA: Diagnosis not present

## 2019-09-14 LAB — KAPPA/LAMBDA LIGHT CHAINS
Kappa free light chain: 142.5 mg/L — ABNORMAL HIGH (ref 3.3–19.4)
Kappa, lambda light chain ratio: 2.46 — ABNORMAL HIGH (ref 0.26–1.65)
Lambda free light chains: 57.9 mg/L — ABNORMAL HIGH (ref 5.7–26.3)

## 2019-09-14 MED FILL — clonazePAM 0.5 MG TABS: 0.5 | 35 days supply | Qty: 30 | Fill #1

## 2019-09-14 NOTE — Telephone Encounter (Signed)
At times has episodes of sweating, mouth twisted looking, unable to walk. Looks  " like limp noodle.  After lying down he will feel better. Happens very infrequently.   No seizure in dialysis still.  Looses grip, staring off in space. After 15-20 minutes back to normal self. This past Saturday though symptoms lasted about an hour.

## 2019-09-15 DIAGNOSIS — I69320 Aphasia following cerebral infarction: Secondary | ICD-10-CM | POA: Diagnosis not present

## 2019-09-15 DIAGNOSIS — I120 Hypertensive chronic kidney disease with stage 5 chronic kidney disease or end stage renal disease: Secondary | ICD-10-CM | POA: Diagnosis not present

## 2019-09-15 DIAGNOSIS — I69351 Hemiplegia and hemiparesis following cerebral infarction affecting right dominant side: Secondary | ICD-10-CM | POA: Diagnosis not present

## 2019-09-15 DIAGNOSIS — N186 End stage renal disease: Secondary | ICD-10-CM | POA: Diagnosis not present

## 2019-09-15 DIAGNOSIS — H2512 Age-related nuclear cataract, left eye: Secondary | ICD-10-CM | POA: Diagnosis not present

## 2019-09-15 DIAGNOSIS — C9000 Multiple myeloma not having achieved remission: Secondary | ICD-10-CM | POA: Diagnosis not present

## 2019-09-15 DIAGNOSIS — E1122 Type 2 diabetes mellitus with diabetic chronic kidney disease: Secondary | ICD-10-CM | POA: Diagnosis not present

## 2019-09-15 DIAGNOSIS — H25012 Cortical age-related cataract, left eye: Secondary | ICD-10-CM | POA: Diagnosis not present

## 2019-09-15 LAB — MULTIPLE MYELOMA PANEL, SERUM
Albumin SerPl Elph-Mcnc: 4.4 g/dL (ref 2.9–4.4)
Albumin/Glob SerPl: 1.8 — ABNORMAL HIGH (ref 0.7–1.7)
Alpha 1: 0.2 g/dL (ref 0.0–0.4)
Alpha2 Glob SerPl Elph-Mcnc: 0.6 g/dL (ref 0.4–1.0)
B-Globulin SerPl Elph-Mcnc: 0.7 g/dL (ref 0.7–1.3)
Gamma Glob SerPl Elph-Mcnc: 0.9 g/dL (ref 0.4–1.8)
Globulin, Total: 2.5 g/dL (ref 2.2–3.9)
IgA: 44 mg/dL — ABNORMAL LOW (ref 90–386)
IgG (Immunoglobin G), Serum: 1052 mg/dL (ref 603–1613)
IgM (Immunoglobulin M), Srm: 26 mg/dL (ref 20–172)
Total Protein ELP: 6.9 g/dL (ref 6.0–8.5)

## 2019-09-15 MED FILL — PREDNISOLONE AC 1% EYE DROP: 1 | 25 days supply | Qty: 5 | Fill #0

## 2019-09-15 MED FILL — OFLOXACIN 0.3% EYE DROPS: 0.3 | 12 days supply | Qty: 5 | Fill #0

## 2019-09-15 NOTE — Telephone Encounter (Signed)
Let's do a 48-hour EEG to capture these episodes and see what brain waves show. Pls let wife know and order Ambulatory EEG, thanks!

## 2019-09-15 NOTE — Addendum Note (Signed)
Addended by: Alphonzo Dublin on: 09/15/2019 03:23 PM   Modules accepted: Orders

## 2019-09-15 NOTE — Telephone Encounter (Signed)
Wife informed. 48 hour EEG ordered. Laureen Ochs messaged.

## 2019-09-16 DIAGNOSIS — N2581 Secondary hyperparathyroidism of renal origin: Secondary | ICD-10-CM | POA: Diagnosis not present

## 2019-09-16 DIAGNOSIS — D631 Anemia in chronic kidney disease: Secondary | ICD-10-CM | POA: Diagnosis not present

## 2019-09-16 DIAGNOSIS — N186 End stage renal disease: Secondary | ICD-10-CM | POA: Diagnosis not present

## 2019-09-16 DIAGNOSIS — E1129 Type 2 diabetes mellitus with other diabetic kidney complication: Secondary | ICD-10-CM | POA: Diagnosis not present

## 2019-09-16 DIAGNOSIS — Z992 Dependence on renal dialysis: Secondary | ICD-10-CM | POA: Diagnosis not present

## 2019-09-16 DIAGNOSIS — D509 Iron deficiency anemia, unspecified: Secondary | ICD-10-CM | POA: Diagnosis not present

## 2019-09-19 DIAGNOSIS — D631 Anemia in chronic kidney disease: Secondary | ICD-10-CM | POA: Diagnosis not present

## 2019-09-19 DIAGNOSIS — N186 End stage renal disease: Secondary | ICD-10-CM | POA: Diagnosis not present

## 2019-09-19 DIAGNOSIS — D509 Iron deficiency anemia, unspecified: Secondary | ICD-10-CM | POA: Diagnosis not present

## 2019-09-19 DIAGNOSIS — N2581 Secondary hyperparathyroidism of renal origin: Secondary | ICD-10-CM | POA: Diagnosis not present

## 2019-09-19 DIAGNOSIS — Z992 Dependence on renal dialysis: Secondary | ICD-10-CM | POA: Diagnosis not present

## 2019-09-19 DIAGNOSIS — E1129 Type 2 diabetes mellitus with other diabetic kidney complication: Secondary | ICD-10-CM | POA: Diagnosis not present

## 2019-09-20 ENCOUNTER — Other Ambulatory Visit: Payer: Self-pay

## 2019-09-20 ENCOUNTER — Inpatient Hospital Stay: Payer: Medicare Other

## 2019-09-20 VITALS — BP 123/68 | HR 81 | Temp 98.7°F | Resp 16 | Wt 184.2 lb

## 2019-09-20 DIAGNOSIS — Z5112 Encounter for antineoplastic immunotherapy: Secondary | ICD-10-CM | POA: Diagnosis not present

## 2019-09-20 DIAGNOSIS — N186 End stage renal disease: Secondary | ICD-10-CM | POA: Diagnosis not present

## 2019-09-20 DIAGNOSIS — C9000 Multiple myeloma not having achieved remission: Secondary | ICD-10-CM

## 2019-09-20 DIAGNOSIS — I69320 Aphasia following cerebral infarction: Secondary | ICD-10-CM | POA: Diagnosis not present

## 2019-09-20 DIAGNOSIS — I120 Hypertensive chronic kidney disease with stage 5 chronic kidney disease or end stage renal disease: Secondary | ICD-10-CM | POA: Diagnosis not present

## 2019-09-20 DIAGNOSIS — E1122 Type 2 diabetes mellitus with diabetic chronic kidney disease: Secondary | ICD-10-CM | POA: Diagnosis not present

## 2019-09-20 DIAGNOSIS — I69351 Hemiplegia and hemiparesis following cerebral infarction affecting right dominant side: Secondary | ICD-10-CM | POA: Diagnosis not present

## 2019-09-20 LAB — CMP (CANCER CENTER ONLY)
ALT: 8 U/L (ref 0–44)
AST: 13 U/L — ABNORMAL LOW (ref 15–41)
Albumin: 4.2 g/dL (ref 3.5–5.0)
Alkaline Phosphatase: 85 U/L (ref 38–126)
Anion gap: 16 — ABNORMAL HIGH (ref 5–15)
BUN: 33 mg/dL — ABNORMAL HIGH (ref 6–20)
CO2: 28 mmol/L (ref 22–32)
Calcium: 8.8 mg/dL — ABNORMAL LOW (ref 8.9–10.3)
Chloride: 96 mmol/L — ABNORMAL LOW (ref 98–111)
Creatinine: 8.25 mg/dL (ref 0.61–1.24)
GFR, Est AFR Am: 8 mL/min — ABNORMAL LOW (ref >60)
GFR, Estimated: 7 mL/min — ABNORMAL LOW (ref >60)
Glucose, Bld: 190 mg/dL — ABNORMAL HIGH (ref 70–99)
Potassium: 4.7 mmol/L (ref 3.5–5.1)
Sodium: 140 mmol/L (ref 135–145)
Total Bilirubin: 0.8 mg/dL (ref 0.3–1.2)
Total Protein: 7 g/dL (ref 6.5–8.1)

## 2019-09-20 LAB — CBC WITH DIFFERENTIAL (CANCER CENTER ONLY)
Abs Immature Granulocytes: 0.02 10*3/uL (ref 0.00–0.07)
Basophils Absolute: 0 10*3/uL (ref 0.0–0.1)
Basophils Relative: 0 %
Eosinophils Absolute: 0 10*3/uL (ref 0.0–0.5)
Eosinophils Relative: 1 %
Hemoglobin: 11.6 g/dL — ABNORMAL LOW (ref 13.0–17.0)
Immature Granulocytes: 0 %
Lymphocytes Relative: 0 %
Lymphs Abs: 0 10*3/uL — ABNORMAL LOW (ref 0.7–4.0)
Monocytes Absolute: 0.2 10*3/uL (ref 0.1–1.0)
Monocytes Relative: 2 %
Neutro Abs: 7.1 10*3/uL (ref 1.7–7.7)
Neutrophils Relative %: 97 %
Platelet Count: 172 10*3/uL (ref 150–400)
WBC Count: 7.4 10*3/uL (ref 4.0–10.5)
nRBC: 1.6 % — ABNORMAL HIGH (ref 0.0–0.2)

## 2019-09-20 MED ORDER — MONTELUKAST SODIUM 10 MG PO TABS
10.0000 mg | ORAL_TABLET | Freq: Once | ORAL | Status: AC
  Administered 2019-09-20: 10 mg via ORAL

## 2019-09-20 MED ORDER — DARATUMUMAB-HYALURONIDASE-FIHJ 1800-30000 MG-UT/15ML ~~LOC~~ SOLN
1800.0000 mg | Freq: Once | SUBCUTANEOUS | Status: AC
  Administered 2019-09-20: 1800 mg via SUBCUTANEOUS
  Filled 2019-09-20: qty 15

## 2019-09-20 MED ORDER — PROCHLORPERAZINE MALEATE 10 MG PO TABS
10.0000 mg | ORAL_TABLET | Freq: Once | ORAL | Status: AC
  Administered 2019-09-20: 10 mg via ORAL

## 2019-09-20 MED ORDER — DIPHENHYDRAMINE HCL 25 MG PO CAPS
ORAL_CAPSULE | ORAL | Status: AC
  Filled 2019-09-20: qty 2

## 2019-09-20 MED ORDER — DIPHENHYDRAMINE HCL 25 MG PO CAPS
ORAL_CAPSULE | ORAL | Status: AC
  Filled 2019-09-20: qty 1

## 2019-09-20 MED ORDER — DIPHENHYDRAMINE HCL 25 MG PO CAPS
50.0000 mg | ORAL_CAPSULE | Freq: Once | ORAL | Status: AC
  Administered 2019-09-20: 50 mg via ORAL

## 2019-09-20 MED ORDER — MONTELUKAST SODIUM 10 MG PO TABS
ORAL_TABLET | ORAL | Status: AC
  Filled 2019-09-20: qty 1

## 2019-09-20 MED ORDER — ACETAMINOPHEN 325 MG PO TABS
ORAL_TABLET | ORAL | Status: AC
  Filled 2019-09-20: qty 2

## 2019-09-20 MED ORDER — ACETAMINOPHEN 325 MG PO TABS
650.0000 mg | ORAL_TABLET | Freq: Once | ORAL | Status: AC
  Administered 2019-09-20: 650 mg via ORAL

## 2019-09-20 MED ORDER — PROCHLORPERAZINE MALEATE 10 MG PO TABS
ORAL_TABLET | ORAL | Status: AC
  Filled 2019-09-20: qty 1

## 2019-09-20 MED ORDER — BORTEZOMIB CHEMO SQ INJECTION 3.5 MG (2.5MG/ML)
1.5000 mg/m2 | Freq: Once | INTRAMUSCULAR | Status: AC
  Administered 2019-09-20: 3 mg via SUBCUTANEOUS
  Filled 2019-09-20: qty 1.2

## 2019-09-20 NOTE — Progress Notes (Signed)
Received a call from [facility] in the lab that patient's creatinine is 8.25. Per communication note on treatment plan, patient has ESRD, and MD does not need to be notified of elevated serum creatinine.

## 2019-09-20 NOTE — Patient Instructions (Signed)
[place] Discharge Instructions for Patients Receiving Chemotherapy  Today you received the following chemotherapy agents: Darzalex Faspro and Velcade.  To help prevent nausea and vomiting after your treatment, we encourage you to take your nausea medication as directed. If you develop nausea and vomiting that is not controlled by your nausea medication, call the clinic.   BELOW ARE SYMPTOMS THAT SHOULD BE REPORTED IMMEDIATELY:  *FEVER GREATER THAN 100.5 F  *CHILLS WITH OR WITHOUT FEVER  NAUSEA AND VOMITING THAT IS NOT CONTROLLED WITH YOUR NAUSEA MEDICATION  *UNUSUAL SHORTNESS OF BREATH  *UNUSUAL BRUISING OR BLEEDING  TENDERNESS IN MOUTH AND THROAT WITH OR WITHOUT PRESENCE OF ULCERS  *URINARY PROBLEMS  *BOWEL PROBLEMS  UNUSUAL RASH Items with * indicate a potential emergency and should be followed up as soon as possible.  Feel free to call the clinic you have any questions or concerns. The clinic phone number is [phone].  Please show the [place] at check-in to the Emergency Department and triage nurse.  Coronavirus (COVID-19) Are you at risk?  Are you at risk for the Coronavirus (COVID-19)?  To be considered HIGH RISK for Coronavirus (COVID-19), you have to meet the following criteria:  . Traveled to [place], [place], [place], [place] or [place]; or in the [place] to [place], [place], [place], or [place]; and have fever, cough, and shortness of breath within the last 2 weeks of travel OR . Been in close contact with a person diagnosed with COVID-19 within the last 2 weeks and have fever, cough, and shortness of breath . IF YOU DO NOT MEET THESE CRITERIA, YOU ARE CONSIDERED LOW RISK FOR COVID-19.  What to do if you are HIGH RISK for COVID-19?  [name] If you are having a medical emergency, call 911. . Seek medical care right away. Before you go to a doctor's office, urgent care or emergency department, call ahead and tell them about  your recent travel, contact with someone diagnosed with COVID-19, and your symptoms. You should receive instructions from your physician's office regarding next steps of care.  . When you arrive at healthcare provider, tell the healthcare staff immediately you have returned from visiting [place], [place], [place], [place] or [place]; or traveled in the [place] to [place], [place], [place], or [place]; in the last two weeks or you have been in close contact with a person diagnosed with COVID-19 in the last 2 weeks.   . Tell the health care staff about your symptoms: fever, cough and shortness of breath. . After you have been seen by a medical provider, you will be either: o Tested for (COVID-19) and discharged home on quarantine except to seek medical care if symptoms worsen, and asked to  - Stay home and avoid contact with others until you get your results (4-5 days)  - Avoid travel on public transportation if possible (such as bus, train, or airplane) or o Sent to the Emergency Department by EMS for evaluation, COVID-19 testing, and possible admission depending on your condition and test results.  What to do if you are LOW RISK for COVID-19?  Reduce your risk of any infection by using the same precautions used for avoiding the common cold or flu:  [name] Wash your hands often with soap and warm water for at least 20 seconds.  If soap and water are not readily available, use an alcohol-based hand sanitizer with at least 60% alcohol.  . If coughing or sneezing,  cover your mouth and nose by coughing or sneezing into the elbow areas of your shirt or coat, into a tissue or into your sleeve (not your hands). . Avoid shaking hands with others and consider head nods or verbal greetings only. . Avoid touching your eyes, nose, or mouth with unwashed hands.  . Avoid close contact with people who are sick. . Avoid places or events with large numbers of people in one location, like concerts or sporting  events. . Carefully consider travel plans you have or are making. . If you are planning any travel outside or inside the [place], visit the CDC's Travelers' Health webpage for the latest health notices. . If you have some symptoms but not all symptoms, continue to monitor at home and seek medical attention if your symptoms worsen. . If you are having a medical emergency, call 911.   [place] / e-Visit: [email or web address]         [facility]: [facility]: [phone]                   [facility]: [phone]

## 2019-09-21 ENCOUNTER — Ambulatory Visit (INDEPENDENT_AMBULATORY_CARE_PROVIDER_SITE_OTHER): Payer: Medicare Other | Admitting: Neurology

## 2019-09-21 DIAGNOSIS — D631 Anemia in chronic kidney disease: Secondary | ICD-10-CM | POA: Diagnosis not present

## 2019-09-21 DIAGNOSIS — G40209 Localization-related (focal) (partial) symptomatic epilepsy and epileptic syndromes with complex partial seizures, not intractable, without status epilepticus: Secondary | ICD-10-CM

## 2019-09-21 DIAGNOSIS — E1129 Type 2 diabetes mellitus with other diabetic kidney complication: Secondary | ICD-10-CM | POA: Diagnosis not present

## 2019-09-21 DIAGNOSIS — N2581 Secondary hyperparathyroidism of renal origin: Secondary | ICD-10-CM | POA: Diagnosis not present

## 2019-09-21 DIAGNOSIS — N186 End stage renal disease: Secondary | ICD-10-CM | POA: Diagnosis not present

## 2019-09-21 DIAGNOSIS — D509 Iron deficiency anemia, unspecified: Secondary | ICD-10-CM | POA: Diagnosis not present

## 2019-09-21 DIAGNOSIS — Z992 Dependence on renal dialysis: Secondary | ICD-10-CM | POA: Diagnosis not present

## 2019-09-22 ENCOUNTER — Encounter: Payer: Self-pay | Admitting: Internal Medicine

## 2019-09-22 ENCOUNTER — Other Ambulatory Visit: Payer: Self-pay

## 2019-09-22 ENCOUNTER — Ambulatory Visit (INDEPENDENT_AMBULATORY_CARE_PROVIDER_SITE_OTHER): Payer: Medicare Other | Admitting: Internal Medicine

## 2019-09-22 VITALS — BP 114/62 | HR 72 | Temp 98.4°F

## 2019-09-22 DIAGNOSIS — I69351 Hemiplegia and hemiparesis following cerebral infarction affecting right dominant side: Secondary | ICD-10-CM | POA: Diagnosis not present

## 2019-09-22 DIAGNOSIS — D582 Other hemoglobinopathies: Secondary | ICD-10-CM

## 2019-09-22 DIAGNOSIS — S065X9A Traumatic subdural hemorrhage with loss of consciousness of unspecified duration, initial encounter: Secondary | ICD-10-CM | POA: Diagnosis not present

## 2019-09-22 DIAGNOSIS — I120 Hypertensive chronic kidney disease with stage 5 chronic kidney disease or end stage renal disease: Secondary | ICD-10-CM

## 2019-09-22 DIAGNOSIS — Z992 Dependence on renal dialysis: Secondary | ICD-10-CM | POA: Diagnosis not present

## 2019-09-22 DIAGNOSIS — C9000 Multiple myeloma not having achieved remission: Secondary | ICD-10-CM

## 2019-09-22 DIAGNOSIS — N2581 Secondary hyperparathyroidism of renal origin: Secondary | ICD-10-CM | POA: Diagnosis not present

## 2019-09-22 DIAGNOSIS — I480 Paroxysmal atrial fibrillation: Secondary | ICD-10-CM

## 2019-09-22 DIAGNOSIS — I69320 Aphasia following cerebral infarction: Secondary | ICD-10-CM | POA: Diagnosis not present

## 2019-09-22 DIAGNOSIS — S065XAA Traumatic subdural hemorrhage with loss of consciousness status unknown, initial encounter: Secondary | ICD-10-CM

## 2019-09-22 DIAGNOSIS — N186 End stage renal disease: Secondary | ICD-10-CM

## 2019-09-22 DIAGNOSIS — E1122 Type 2 diabetes mellitus with diabetic chronic kidney disease: Secondary | ICD-10-CM | POA: Diagnosis not present

## 2019-09-23 DIAGNOSIS — D631 Anemia in chronic kidney disease: Secondary | ICD-10-CM | POA: Diagnosis not present

## 2019-09-23 DIAGNOSIS — N186 End stage renal disease: Secondary | ICD-10-CM | POA: Diagnosis not present

## 2019-09-23 DIAGNOSIS — N2581 Secondary hyperparathyroidism of renal origin: Secondary | ICD-10-CM | POA: Diagnosis not present

## 2019-09-23 DIAGNOSIS — Z992 Dependence on renal dialysis: Secondary | ICD-10-CM | POA: Diagnosis not present

## 2019-09-23 DIAGNOSIS — E1129 Type 2 diabetes mellitus with other diabetic kidney complication: Secondary | ICD-10-CM | POA: Diagnosis not present

## 2019-09-23 DIAGNOSIS — D509 Iron deficiency anemia, unspecified: Secondary | ICD-10-CM | POA: Diagnosis not present

## 2019-09-23 LAB — HEMOGLOBIN A1C
Est. average glucose Bld gHb Est-mCnc: 100 mg/dL
Hgb A1c MFr Bld: 5.1 % (ref 4.8–5.6)

## 2019-09-24 MED FILL — KETOROLAC 0.5% OPHTH SOLUTI: 0.5 | 50 days supply | Qty: 10 | Fill #0

## 2019-09-26 DIAGNOSIS — D509 Iron deficiency anemia, unspecified: Secondary | ICD-10-CM | POA: Diagnosis not present

## 2019-09-26 DIAGNOSIS — N186 End stage renal disease: Secondary | ICD-10-CM | POA: Diagnosis not present

## 2019-09-26 DIAGNOSIS — D631 Anemia in chronic kidney disease: Secondary | ICD-10-CM | POA: Diagnosis not present

## 2019-09-26 DIAGNOSIS — Z992 Dependence on renal dialysis: Secondary | ICD-10-CM | POA: Diagnosis not present

## 2019-09-26 DIAGNOSIS — E1129 Type 2 diabetes mellitus with other diabetic kidney complication: Secondary | ICD-10-CM | POA: Diagnosis not present

## 2019-09-26 DIAGNOSIS — N2581 Secondary hyperparathyroidism of renal origin: Secondary | ICD-10-CM | POA: Diagnosis not present

## 2019-09-27 ENCOUNTER — Telehealth: Payer: Self-pay

## 2019-09-27 ENCOUNTER — Inpatient Hospital Stay: Payer: Medicare Other

## 2019-09-27 ENCOUNTER — Telehealth: Payer: Self-pay | Admitting: Neurology

## 2019-09-27 ENCOUNTER — Other Ambulatory Visit: Payer: Self-pay

## 2019-09-27 VITALS — BP 108/56 | HR 78 | Temp 97.8°F | Resp 18

## 2019-09-27 DIAGNOSIS — C9000 Multiple myeloma not having achieved remission: Secondary | ICD-10-CM

## 2019-09-27 DIAGNOSIS — Z5112 Encounter for antineoplastic immunotherapy: Secondary | ICD-10-CM | POA: Diagnosis not present

## 2019-09-27 DIAGNOSIS — H25012 Cortical age-related cataract, left eye: Secondary | ICD-10-CM | POA: Diagnosis not present

## 2019-09-27 DIAGNOSIS — H25812 Combined forms of age-related cataract, left eye: Secondary | ICD-10-CM | POA: Diagnosis not present

## 2019-09-27 DIAGNOSIS — H2512 Age-related nuclear cataract, left eye: Secondary | ICD-10-CM | POA: Diagnosis not present

## 2019-09-27 LAB — CBC WITH DIFFERENTIAL (CANCER CENTER ONLY)
Abs Immature Granulocytes: 0.01 10*3/uL (ref 0.00–0.07)
Basophils Absolute: 0 10*3/uL (ref 0.0–0.1)
Basophils Relative: 1 %
Eosinophils Absolute: 0.3 10*3/uL (ref 0.0–0.5)
Eosinophils Relative: 5 %
Hemoglobin: 10.8 g/dL — ABNORMAL LOW (ref 13.0–17.0)
Immature Granulocytes: 0 %
Lymphocytes Relative: 0 %
Lymphs Abs: 0 10*3/uL — ABNORMAL LOW (ref 0.7–4.0)
Monocytes Absolute: 0.8 10*3/uL (ref 0.1–1.0)
Monocytes Relative: 15 %
Neutro Abs: 4.3 10*3/uL (ref 1.7–7.7)
Neutrophils Relative %: 79 %
Platelet Count: 219 10*3/uL (ref 150–400)
WBC Count: 5.4 10*3/uL (ref 4.0–10.5)
nRBC: 14.7 % — ABNORMAL HIGH (ref 0.0–0.2)

## 2019-09-27 LAB — CMP (CANCER CENTER ONLY)
ALT: 7 U/L (ref 0–44)
AST: 13 U/L — ABNORMAL LOW (ref 15–41)
Albumin: 3.9 g/dL (ref 3.5–5.0)
Alkaline Phosphatase: 86 U/L (ref 38–126)
Anion gap: 12 (ref 5–15)
BUN: 34 mg/dL — ABNORMAL HIGH (ref 6–20)
CO2: 31 mmol/L (ref 22–32)
Calcium: 8.9 mg/dL (ref 8.9–10.3)
Chloride: 96 mmol/L — ABNORMAL LOW (ref 98–111)
Creatinine: 8.81 mg/dL (ref 0.61–1.24)
GFR, Est AFR Am: 7 mL/min — ABNORMAL LOW (ref >60)
GFR, Estimated: 6 mL/min — ABNORMAL LOW (ref >60)
Glucose, Bld: 149 mg/dL — ABNORMAL HIGH (ref 70–99)
Potassium: 4.6 mmol/L (ref 3.5–5.1)
Sodium: 139 mmol/L (ref 135–145)
Total Bilirubin: 0.9 mg/dL (ref 0.3–1.2)
Total Protein: 6.5 g/dL (ref 6.5–8.1)

## 2019-09-27 MED ORDER — DIPHENHYDRAMINE HCL 25 MG PO CAPS
50.0000 mg | ORAL_CAPSULE | Freq: Once | ORAL | Status: AC
  Administered 2019-09-27: 50 mg via ORAL

## 2019-09-27 MED ORDER — ACETAMINOPHEN 325 MG PO TABS
650.0000 mg | ORAL_TABLET | Freq: Once | ORAL | Status: AC
  Administered 2019-09-27: 650 mg via ORAL

## 2019-09-27 MED ORDER — ACETAMINOPHEN 325 MG PO TABS
ORAL_TABLET | ORAL | Status: AC
  Filled 2019-09-27: qty 2

## 2019-09-27 MED ORDER — MONTELUKAST SODIUM 10 MG PO TABS
ORAL_TABLET | ORAL | Status: AC
  Filled 2019-09-27: qty 1

## 2019-09-27 MED ORDER — PROCHLORPERAZINE MALEATE 10 MG PO TABS
ORAL_TABLET | ORAL | Status: AC
  Filled 2019-09-27: qty 1

## 2019-09-27 MED ORDER — MONTELUKAST SODIUM 10 MG PO TABS
10.0000 mg | ORAL_TABLET | Freq: Once | ORAL | Status: AC
  Administered 2019-09-27: 10 mg via ORAL

## 2019-09-27 MED ORDER — BORTEZOMIB CHEMO SQ INJECTION 3.5 MG (2.5MG/ML)
1.5000 mg/m2 | Freq: Once | INTRAMUSCULAR | Status: AC
  Administered 2019-09-27: 3 mg via SUBCUTANEOUS
  Filled 2019-09-27: qty 1.2

## 2019-09-27 MED ORDER — DARATUMUMAB-HYALURONIDASE-FIHJ 1800-30000 MG-UT/15ML ~~LOC~~ SOLN
1800.0000 mg | Freq: Once | SUBCUTANEOUS | Status: AC
  Administered 2019-09-27: 1800 mg via SUBCUTANEOUS
  Filled 2019-09-27: qty 15

## 2019-09-27 MED ORDER — PROCHLORPERAZINE MALEATE 10 MG PO TABS
10.0000 mg | ORAL_TABLET | Freq: Once | ORAL | Status: AC
  Administered 2019-09-27: 10 mg via ORAL

## 2019-09-27 MED ORDER — DIPHENHYDRAMINE HCL 25 MG PO CAPS
ORAL_CAPSULE | ORAL | Status: AC
  Filled 2019-09-27: qty 2

## 2019-09-27 NOTE — Telephone Encounter (Signed)
Lab called with Scr 8.81. Dr. Alen Blew made aware, no new orders at this time.

## 2019-09-27 NOTE — Patient Instructions (Signed)
[facility] Discharge Instructions for Patients Receiving Chemotherapy  Today you received the following chemotherapy agents: Darzalex Faspro and Velcade.  To help prevent nausea and vomiting after your treatment, we encourage you to take your nausea medication as directed. If you develop nausea and vomiting that is not controlled by your nausea medication, call the clinic.   BELOW ARE SYMPTOMS THAT SHOULD BE REPORTED IMMEDIATELY:  *FEVER GREATER THAN 100.5 F  *CHILLS WITH OR WITHOUT FEVER  NAUSEA AND VOMITING THAT IS NOT CONTROLLED WITH YOUR NAUSEA MEDICATION  *UNUSUAL SHORTNESS OF BREATH  *UNUSUAL BRUISING OR BLEEDING  TENDERNESS IN MOUTH AND THROAT WITH OR WITHOUT PRESENCE OF ULCERS  *URINARY PROBLEMS  *BOWEL PROBLEMS  UNUSUAL RASH Items with * indicate a potential emergency and should be followed up as soon as possible.  Feel free to call the clinic you have any questions or concerns. The clinic phone number is [phone].  Please show the [place] at check-in to the Emergency Department and triage nurse.  Coronavirus (COVID-19) Are you at risk?  Are you at risk for the Coronavirus (COVID-19)?  To be considered HIGH RISK for Coronavirus (COVID-19), you have to meet the following criteria:  . Traveled to [place], [place], [place], [place] or [place]; or in the [place] to [place], [place], [place], or [place]; and have fever, cough, and shortness of breath within the last 2 weeks of travel OR . Been in close contact with a person diagnosed with COVID-19 within the last 2 weeks and have fever, cough, and shortness of breath . IF YOU DO NOT MEET THESE CRITERIA, YOU ARE CONSIDERED LOW RISK FOR COVID-19.  What to do if you are HIGH RISK for COVID-19?  [name] If you are having a medical emergency, call 911. . Seek medical care right away. Before you go to a doctor's office, urgent care or emergency department, call ahead and tell them about  your recent travel, contact with someone diagnosed with COVID-19, and your symptoms. You should receive instructions from your physician's office regarding next steps of care.  . When you arrive at healthcare provider, tell the healthcare staff immediately you have returned from visiting [place], [place], [place], [place] or [place]; or traveled in the [place] to [place], [place], [place], or [place]; in the last two weeks or you have been in close contact with a person diagnosed with COVID-19 in the last 2 weeks.   . Tell the health care staff about your symptoms: fever, cough and shortness of breath. . After you have been seen by a medical provider, you will be either: o Tested for (COVID-19) and discharged home on quarantine except to seek medical care if symptoms worsen, and asked to  - Stay home and avoid contact with others until you get your results (4-5 days)  - Avoid travel on public transportation if possible (such as bus, train, or airplane) or o Sent to the Emergency Department by EMS for evaluation, COVID-19 testing, and possible admission depending on your condition and test results.  What to do if you are LOW RISK for COVID-19?  Reduce your risk of any infection by using the same precautions used for avoiding the common cold or flu:  [name] Wash your hands often with soap and warm water for at least 20 seconds.  If soap and water are not readily available, use an alcohol-based hand sanitizer with at least 60% alcohol.  . If coughing or sneezing,  cover your mouth and nose by coughing or sneezing into the elbow areas of your shirt or coat, into a tissue or into your sleeve (not your hands). . Avoid shaking hands with others and consider head nods or verbal greetings only. . Avoid touching your eyes, nose, or mouth with unwashed hands.  . Avoid close contact with people who are sick. . Avoid places or events with large numbers of people in one location, like concerts or sporting  events. . Carefully consider travel plans you have or are making. . If you are planning any travel outside or inside the [place], visit the CDC's Travelers' Health webpage for the latest health notices. . If you have some symptoms but not all symptoms, continue to monitor at home and seek medical attention if your symptoms worsen. . If you are having a medical emergency, call 911.   [place] / e-Visit: [email or web address]         [facility]: [facility]: [phone]                   [name]: [phone]

## 2019-09-27 NOTE — Progress Notes (Signed)
Pt states he took decadron 20 mg po at home this morning.

## 2019-09-27 NOTE — Procedures (Signed)
ELECTROENCEPHALOGRAM REPORT  Dates of Recording: 09/21/2019 9:13AM to 09/23/2019 9:15AM  Patient's Name: Isaiah [name] MRN: [id number] Date of Birth: 02-01-1966  Referring Provider: Dr. Ellouise Newer  Procedure: 48-hour ambulatory video EEG  History: This is a 54 year old man with a history of left MCA stroke, seizures usually occurring at dialysis, recent right SDH, with new episodes of sweating with mouth twisted, unable to walk, staring off into space, lasting for an hour. EEG for classification.  Medications: Lacosamide, Lamotrigine, clonazepam during HD  Technical Summary: This is a 48-hour multichannel digital video EEG recording measured by the international 10-20 system with electrodes applied with paste and impedances below 5000 ohms performed as portable with EKG monitoring.  The digital EEG was referentially recorded, reformatted, and digitally filtered in a variety of bipolar and referential montages for optimal display.    DESCRIPTION OF RECORDING: During maximal wakefulness, the background activity consisted of a symmetric 7 Hz posterior dominant rhythm which was reactive to eye opening. There is occasional focal theta and delta slowing over the left temporal region.  There were no epileptiform discharges seen in wakefulness.  During the recording, the patient progresses through wakefulness, drowsiness, and Stage 2 sleep. Similar focal slowing is seen over the left temporal region.  Again, there were no epileptiform discharges seen.  Events: On 1/14/at 1037 hours, legs are weak on the right side. Patient is not on video. Electrographically, there were no EEG or EKG changes seen.  On 1/15 between 0743 to 0800 hours, legs are weak, not understanding commands. Patient is not on video. Electrographically, there were no EEG or EKG changes seen.  There were no electrographic seizures seen.  EKG lead was unremarkable.  IMPRESSION: This 48-hour ambulatory video EEG study is abnormal  due to the presence of: 1. Slowing of the posterior dominant rhythm 2. Occasional focal slowing over the left temporal region  CLINICAL CORRELATION of the above findings indicates focal cerebral dysfunction in the left temporal region consistent with prior stroke in this region. Slowing of the posterior dominant rhythm indicates diffuse cerebral dysfunction that is non-specific in etiology and may be seen with hypoxic/ischemic injury, toxic/metabolic encephalopathy, neurodegenerative disease, or excessive drowsiness. Episode of confusion and weakness did not show any epileptiform correlate. If further clinical questions remain, inpatient video EEG monitoring may be helpful. Clinical correlation is advised.   Ellouise Newer, M.D.

## 2019-09-27 NOTE — Telephone Encounter (Signed)
Left VM on wife's phone to discuss EEG results. No seizure activity seen when he was not responding.

## 2019-09-28 DIAGNOSIS — D631 Anemia in chronic kidney disease: Secondary | ICD-10-CM | POA: Diagnosis not present

## 2019-09-28 DIAGNOSIS — R519 Headache, unspecified: Secondary | ICD-10-CM | POA: Insufficient documentation

## 2019-09-28 DIAGNOSIS — Z992 Dependence on renal dialysis: Secondary | ICD-10-CM | POA: Diagnosis not present

## 2019-09-28 DIAGNOSIS — N186 End stage renal disease: Secondary | ICD-10-CM | POA: Diagnosis not present

## 2019-09-28 DIAGNOSIS — E1129 Type 2 diabetes mellitus with other diabetic kidney complication: Secondary | ICD-10-CM | POA: Diagnosis not present

## 2019-09-28 DIAGNOSIS — N2581 Secondary hyperparathyroidism of renal origin: Secondary | ICD-10-CM | POA: Diagnosis not present

## 2019-09-28 DIAGNOSIS — D509 Iron deficiency anemia, unspecified: Secondary | ICD-10-CM | POA: Diagnosis not present

## 2019-09-28 HISTORY — DX: Headache, unspecified: R51.9

## 2019-09-28 NOTE — Telephone Encounter (Signed)
Spoke to wife. He has episodes where he is limp, deadweight and does not respond, then when she asks if he hears her and he says he does and then back to baseline. Typical episode was captured, no EEG changes seen. Discussed these were not epileptic. Wife records BP meds daily, she does give his BP meds during HD. When it is low at home, she holds it. He has not taken BP meds in 2 weeks because BP has been running low. Range 12/29 7am 100/55, 115/77; Wed 100/57, night 87/40; highest of 139/63 and she gave BP med that time. At night 168/68. She has tried checking BP when he had one of the episodes BP 102/50 , then that night it was 84/42. Episodes likely flow-related/BP-related. Discussed elevating legs, isometric exercises. Wife expressed understanding.

## 2019-09-29 DIAGNOSIS — C9000 Multiple myeloma not having achieved remission: Secondary | ICD-10-CM | POA: Diagnosis not present

## 2019-09-29 DIAGNOSIS — I69320 Aphasia following cerebral infarction: Secondary | ICD-10-CM | POA: Diagnosis not present

## 2019-09-29 DIAGNOSIS — I69351 Hemiplegia and hemiparesis following cerebral infarction affecting right dominant side: Secondary | ICD-10-CM | POA: Diagnosis not present

## 2019-09-29 DIAGNOSIS — N186 End stage renal disease: Secondary | ICD-10-CM | POA: Diagnosis not present

## 2019-09-29 DIAGNOSIS — I120 Hypertensive chronic kidney disease with stage 5 chronic kidney disease or end stage renal disease: Secondary | ICD-10-CM | POA: Diagnosis not present

## 2019-09-29 DIAGNOSIS — E1122 Type 2 diabetes mellitus with diabetic chronic kidney disease: Secondary | ICD-10-CM | POA: Diagnosis not present

## 2019-09-30 DIAGNOSIS — D631 Anemia in chronic kidney disease: Secondary | ICD-10-CM | POA: Diagnosis not present

## 2019-09-30 DIAGNOSIS — N2581 Secondary hyperparathyroidism of renal origin: Secondary | ICD-10-CM | POA: Diagnosis not present

## 2019-09-30 DIAGNOSIS — Z992 Dependence on renal dialysis: Secondary | ICD-10-CM | POA: Diagnosis not present

## 2019-09-30 DIAGNOSIS — E1129 Type 2 diabetes mellitus with other diabetic kidney complication: Secondary | ICD-10-CM | POA: Diagnosis not present

## 2019-09-30 DIAGNOSIS — N186 End stage renal disease: Secondary | ICD-10-CM | POA: Diagnosis not present

## 2019-09-30 DIAGNOSIS — D509 Iron deficiency anemia, unspecified: Secondary | ICD-10-CM | POA: Diagnosis not present

## 2019-10-02 NOTE — Progress Notes (Signed)
This visit occurred during the SARS-CoV-2 public health emergency.  Safety protocols were in place, including screening questions prior to the visit, additional usage of staff PPE, and extensive cleaning of exam room while observing appropriate contact time as indicated for disinfecting solutions.  Subjective:     Patient ID: [place] , male    DOB: 1966/01/08 , 54 y.o.   MRN: [id number]   Chief Complaint  Patient presents with  . Hypertension    HPI  He presents today for a BP check.  He reports compliance with meds. He is accompanied by his wife. She admits that she holds meds if BP is "too low".   Hypertension This is a chronic problem. The current episode started more than 1 year ago. The problem has been gradually improving since onset. The problem is controlled. Pertinent negatives include no blurred vision, chest pain, palpitations or shortness of breath. The current treatment provides moderate improvement.     Past Medical History:  Diagnosis Date  . A-fib ([place])   . Anemia   . Asthma   . Diabetes mellitus without complication ([place])   . DM type 2 (diabetes mellitus, type 2) ([place]) 06/09/2019  . ESRD (end stage renal disease) on dialysis ([place])   . Essential hypertension 06/09/2019  . GIB (gastrointestinal bleeding)    Recurrent episodes- 09/2014, 09/2015 and 05/2016  . Gout   . History of recent blood transfusion 10/27/14   2 Units PRBC's  . Hyperkalemia 07/2011  . Hypertension   . Monoclonal gammopathy   . Multiple myeloma ([place])   . OSA on CPAP   . Pulmonary emboli ([place]) 07/2011   bilateral--treated with comadin X 6 months  . Pulmonary embolism ([place]) 07/2011; 09/27/2014   a. Bilat PE 07/2011 - unclear cause, tx with 6 months Coumadin.;   . Seizure disorder ([place]) 06/09/2019  . Seizures ([place])   . Sepsis ([place])   . Sickle cell-thalassemia disease ([place])    a. Sickle cell trait.  . Sleep apnea   . Stroke ([place])   . Stroke ([place]) 09/2015   R-MCA, L-MCA, PCA and  bilateral cerebellar complicated by DVT/PE  . Subdural hematoma ([place]) 05/2019     Family History  Problem Relation Age of Onset  . Hypertension Mother   . Hypertension Father   . Stroke Father   . Sickle cell anemia Brother   . Diabetes Mother   . Sickle cell anemia Brother   . Diabetes type I Brother   . Kidney disease Brother      Current Outpatient Medications:  .  acetaminophen (TYLENOL) 325 MG tablet, Take 1-2 tablets (325-650 mg total) by mouth every 4 (four) hours as needed for mild pain., Disp:  , Rfl:  .  acyclovir (ZOVIRAX) 400 MG tablet, Take 400 mg by mouth at bedtime., Disp: , Rfl:  .  albuterol (PROAIR HFA) 108 (90 Base) MCG/ACT inhaler, Inhale two puffs every 4-6 hours if needed for cough or wheeze, Disp: 1 Inhaler, Rfl: 1 .  amLODipine (NORVASC) 2.5 MG tablet, Take 1 tablet (2.5 mg total) by mouth at bedtime. (Patient taking differently: Take 2.5 mg by mouth at bedtime. ON HOLD PER WIFE), Disp: 30 tablet, Rfl: 0 .  calcium carbonate (TUMS - DOSED IN MG ELEMENTAL CALCIUM) 500 MG chewable tablet, Chew 2 tablets by mouth at bedtime., Disp: , Rfl:  .  camphor-menthol (SARNA) lotion, Apply 1 application topically every 8 (eight) hours as needed for itching., Disp: 222 mL, Rfl: 0 .  Cetirizine HCl 10 MG CAPS, Take 1 capsule by mouth daily., Disp: , Rfl:  .  cinacalcet (SENSIPAR) 60 MG tablet, Take 60 mg by mouth every evening. ON HOLD FOR DIALYSIS PER WIFE, Disp: , Rfl:  .  ciprofloxacin (CIPRO) 500 MG tablet, Take 500 mg by mouth every evening. For 120 days, Disp: , Rfl:  .  clonazePAM (KLONOPIN) 0.5 MG tablet, Take 2 tablets every Monday, Wednesday, and Friday with hemodialysis, Disp: 30 tablet, Rfl: 5 .  dexamethasone (DECADRON) 4 MG tablet, Take 5 tablets (20 mg total) by mouth once a week. Take 5 tablets (20 mg total) by mouth once a week on day of chemo., Disp: 40 tablet, Rfl: 3 .  fluticasone furoate-vilanterol (BREO ELLIPTA) 200-25 MCG/INH AEPB, Inhale 1 puff into the  lungs daily., Disp: , Rfl:  .  hydrOXYzine (ATARAX/VISTARIL) 25 MG tablet, Take 1 tablet (25 mg total) by mouth every 8 (eight) hours as needed for itching., Disp: 30 tablet, Rfl: 0 .  lacosamide (VIMPAT) 200 MG TABS tablet, Take 1 tablet twice a day except on dialysis days M-W-F give 1 tab in AM, 1 tab after dialysis, 1 tab in PM., Disp: 90 tablet, Rfl: 5 .  lamoTRIgine (LAMICTAL) 150 MG tablet, Take 1 tablet (150 mg total) by mouth 2 (two) times daily., Disp: 180 tablet, Rfl: 3 .  metoprolol tartrate (LOPRESSOR) 25 MG tablet, Take 12.5 mg by mouth 2 (two) times daily., Disp: , Rfl:  .  multivitamin (RENA-VIT) TABS tablet, Take 1 tablet by mouth at bedtime., Disp: , Rfl:  .  pantoprazole (PROTONIX) 40 MG tablet, Take 40 mg by mouth daily., Disp: , Rfl:  .  prochlorperazine (COMPAZINE) 10 MG tablet, TAKE 1 TABLET (10 MG TOTAL) BY MOUTH EVERY 6 (SIX) HOURS AS NEEDED FOR NAUSEA OR VOMITING., Disp: 30 tablet, Rfl: 0   Allergies  Allergen Reactions  . Codeine Rash and Other (See Comments)    Unknown reaction (patient says it was more serious than just a rash, but he can't remember what happened) Unknown reaction (patient says it was more serious than just a rash, but he can't remember what happened)  . Codeine Swelling     Review of Systems  Constitutional: Negative.   Eyes: Negative for blurred vision.  Respiratory: Negative.  Negative for shortness of breath.   Cardiovascular: Negative.  Negative for chest pain and palpitations.  Gastrointestinal: Negative.   Neurological: Negative.   Psychiatric/Behavioral: Negative.      Today's Vitals   09/22/19 1154  BP: 114/62  Pulse: 72  Temp: 98.4 F (36.9 C)   There is no height or weight on file to calculate BMI.   Objective:  Physical Exam Vitals and nursing note reviewed.  Constitutional:      Appearance: Normal appearance.  Cardiovascular:     Rate and Rhythm: Normal rate and regular rhythm.     Heart sounds: Normal heart sounds.   Pulmonary:     Effort: Pulmonary effort is normal.     Breath sounds: Normal breath sounds.  Skin:    General: Skin is warm.  Neurological:     Mental Status: He is alert.  Psychiatric:        Mood and Affect: Mood normal.         Assessment And Plan:     1. Benign hypertensive kidney disease with chronic kidney disease stage V or end stage renal disease (HCC)  Chronic, well controlled. He will continue with current meds. He is currently  on hemodialysis.   2. SDH (subdural hematoma) (HCC)  Now chronic. Unfortunately, this has resulted in impaired ADLs/mobility.   3. Diabetes mellitus with end-stage renal disease (HCC)  Chronic, yet had been stable. He does report elevated blood sugars recently. I will check an a1c today. I will make further recommendations once his labs are available for review.   - Hemoglobin A1c  4. Multiple myeloma not having achieved remission (HCC)  Chronic, yet stable. He is also followed by Hematology. He is currently receiving therapy with daratumumab,Velcade and dexamethasone. He may still receive high dose chemo and stem cell transplant in the future.    5. Paroxysmal atrial fibrillation (HCC)  Chronic, yet stable. He is currently in sinus rhythm.   6. Hyperparathyroidism, secondary renal (HCC)  Chronic, secondary to end stage renal disease. Most recent SPEP results reviewed in detail.   7. Other hemoglobinopathies (HCC)  Chronic, yet stable. He is also followed by Hematology.      [name], MD    THE PATIENT IS ENCOURAGED TO PRACTICE SOCIAL DISTANCING DUE TO THE COVID-19 PANDEMIC.

## 2019-10-03 ENCOUNTER — Telehealth: Payer: Self-pay | Admitting: Neurology

## 2019-10-03 ENCOUNTER — Telehealth: Payer: Self-pay

## 2019-10-03 ENCOUNTER — Other Ambulatory Visit: Payer: Self-pay

## 2019-10-03 DIAGNOSIS — R531 Weakness: Secondary | ICD-10-CM

## 2019-10-03 DIAGNOSIS — Z8673 Personal history of transient ischemic attack (TIA), and cerebral infarction without residual deficits: Secondary | ICD-10-CM

## 2019-10-03 DIAGNOSIS — R4182 Altered mental status, unspecified: Secondary | ICD-10-CM

## 2019-10-03 NOTE — Telephone Encounter (Signed)
Spoke with [name] informed her  With change in symptoms, especially vomiting, he will need repeat head CT per Dr. Delice Lesch. Asked Can she bring him to the ER? She stated NO, she stated not with Covid she said that if he gets worse she might take him but not right now, at this time he is sitting up and is keeping his soup she had just fed him down, Dr. Delice Lesch was informed

## 2019-10-03 NOTE — Telephone Encounter (Signed)
Spoke with pt wife she stated that he had weakness was unable to move this morning was unable to go to dialysis, she said that he had vomited this morning, he had slurred speech and was slow to answer her, she said he didn't have a temp, no facial droop, at this time he is eating soup and keeping it down, Pt wife stated that on Friday pt had an episode where he couldn't move his legs,

## 2019-10-03 NOTE — Telephone Encounter (Signed)
Patient's wife called and left a message requesting a call back from a nurse. I returned her call to get more information but had to leave a voice mail to call back.

## 2019-10-03 NOTE — Telephone Encounter (Signed)
With change in symptoms, especially vomiting, he will need repeat head CT. Can she bring him to the ER?

## 2019-10-03 NOTE — Telephone Encounter (Signed)
Pt called back no answer left message for them  To return call

## 2019-10-03 NOTE — Telephone Encounter (Signed)
Spoke with pt wife informed her that STAT CT was ordered at [place] because they did not want to go to the ER, PT refused to go to [facility] imaging, pt wife informed to monitor pt if he gets worse call 911 go to the ER, pt wife stated she would continue to monitor pt

## 2019-10-03 NOTE — Telephone Encounter (Signed)
Wife left msg with after hours returning your call. Thanks!

## 2019-10-04 ENCOUNTER — Telehealth: Payer: Self-pay | Admitting: Neurology

## 2019-10-04 ENCOUNTER — Telehealth: Payer: Self-pay

## 2019-10-04 ENCOUNTER — Inpatient Hospital Stay: Payer: Medicare Other

## 2019-10-04 ENCOUNTER — Other Ambulatory Visit: Payer: Self-pay

## 2019-10-04 ENCOUNTER — Inpatient Hospital Stay (HOSPITAL_COMMUNITY)
Admission: EM | Admit: 2019-10-04 | Discharge: 2019-10-07 | DRG: 682 | Disposition: A | Discharge status: Home / Self Care | Payer: Medicare Other | Attending: Internal Medicine | Admitting: Internal Medicine

## 2019-10-04 ENCOUNTER — Inpatient Hospital Stay: Payer: Medicare Other | Admitting: Oncology

## 2019-10-04 ENCOUNTER — Emergency Department (HOSPITAL_COMMUNITY): Payer: Medicare Other

## 2019-10-04 ENCOUNTER — Encounter (HOSPITAL_COMMUNITY): Payer: Self-pay | Admitting: Emergency Medicine

## 2019-10-04 DIAGNOSIS — Z79899 Other long term (current) drug therapy: Secondary | ICD-10-CM

## 2019-10-04 DIAGNOSIS — Z823 Family history of stroke: Secondary | ICD-10-CM

## 2019-10-04 DIAGNOSIS — Z20822 Contact with and (suspected) exposure to covid-19: Secondary | ICD-10-CM | POA: Diagnosis not present

## 2019-10-04 DIAGNOSIS — G92 Toxic encephalopathy: Secondary | ICD-10-CM | POA: Diagnosis not present

## 2019-10-04 DIAGNOSIS — I482 Chronic atrial fibrillation, unspecified: Secondary | ICD-10-CM | POA: Diagnosis present

## 2019-10-04 DIAGNOSIS — R29810 Facial weakness: Secondary | ICD-10-CM | POA: Diagnosis not present

## 2019-10-04 DIAGNOSIS — N2581 Secondary hyperparathyroidism of renal origin: Secondary | ICD-10-CM | POA: Diagnosis present

## 2019-10-04 DIAGNOSIS — Q211 Atrial septal defect: Secondary | ICD-10-CM

## 2019-10-04 DIAGNOSIS — Z8701 Personal history of pneumonia (recurrent): Secondary | ICD-10-CM

## 2019-10-04 DIAGNOSIS — R471 Dysarthria and anarthria: Secondary | ICD-10-CM | POA: Diagnosis not present

## 2019-10-04 DIAGNOSIS — D631 Anemia in chronic kidney disease: Secondary | ICD-10-CM | POA: Diagnosis present

## 2019-10-04 DIAGNOSIS — I6203 Nontraumatic chronic subdural hemorrhage: Secondary | ICD-10-CM | POA: Diagnosis not present

## 2019-10-04 DIAGNOSIS — I6200 Nontraumatic subdural hemorrhage, unspecified: Secondary | ICD-10-CM | POA: Diagnosis not present

## 2019-10-04 DIAGNOSIS — I6201 Nontraumatic acute subdural hemorrhage: Secondary | ICD-10-CM | POA: Diagnosis present

## 2019-10-04 DIAGNOSIS — Z8249 Family history of ischemic heart disease and other diseases of the circulatory system: Secondary | ICD-10-CM

## 2019-10-04 DIAGNOSIS — Z87442 Personal history of urinary calculi: Secondary | ICD-10-CM

## 2019-10-04 DIAGNOSIS — J449 Chronic obstructive pulmonary disease, unspecified: Secondary | ICD-10-CM | POA: Diagnosis present

## 2019-10-04 DIAGNOSIS — G40909 Epilepsy, unspecified, not intractable, without status epilepticus: Secondary | ICD-10-CM | POA: Diagnosis present

## 2019-10-04 DIAGNOSIS — Z841 Family history of disorders of kidney and ureter: Secondary | ICD-10-CM

## 2019-10-04 DIAGNOSIS — Z992 Dependence on renal dialysis: Secondary | ICD-10-CM | POA: Diagnosis not present

## 2019-10-04 DIAGNOSIS — N186 End stage renal disease: Secondary | ICD-10-CM | POA: Diagnosis present

## 2019-10-04 DIAGNOSIS — I120 Hypertensive chronic kidney disease with stage 5 chronic kidney disease or end stage renal disease: Secondary | ICD-10-CM | POA: Diagnosis not present

## 2019-10-04 DIAGNOSIS — Z95828 Presence of other vascular implants and grafts: Secondary | ICD-10-CM

## 2019-10-04 DIAGNOSIS — R4182 Altered mental status, unspecified: Secondary | ICD-10-CM | POA: Diagnosis present

## 2019-10-04 DIAGNOSIS — R4781 Slurred speech: Secondary | ICD-10-CM | POA: Diagnosis not present

## 2019-10-04 DIAGNOSIS — Z86711 Personal history of pulmonary embolism: Secondary | ICD-10-CM

## 2019-10-04 DIAGNOSIS — C9000 Multiple myeloma not having achieved remission: Secondary | ICD-10-CM | POA: Diagnosis not present

## 2019-10-04 DIAGNOSIS — I69351 Hemiplegia and hemiparesis following cerebral infarction affecting right dominant side: Secondary | ICD-10-CM

## 2019-10-04 DIAGNOSIS — G9340 Encephalopathy, unspecified: Secondary | ICD-10-CM | POA: Diagnosis not present

## 2019-10-04 DIAGNOSIS — E1122 Type 2 diabetes mellitus with diabetic chronic kidney disease: Secondary | ICD-10-CM | POA: Diagnosis present

## 2019-10-04 DIAGNOSIS — I272 Pulmonary hypertension, unspecified: Secondary | ICD-10-CM | POA: Diagnosis present

## 2019-10-04 DIAGNOSIS — G4733 Obstructive sleep apnea (adult) (pediatric): Secondary | ICD-10-CM | POA: Diagnosis present

## 2019-10-04 DIAGNOSIS — R531 Weakness: Secondary | ICD-10-CM | POA: Diagnosis not present

## 2019-10-04 DIAGNOSIS — H44002 Unspecified purulent endophthalmitis, left eye: Secondary | ICD-10-CM | POA: Diagnosis present

## 2019-10-04 DIAGNOSIS — D573 Sickle-cell trait: Secondary | ICD-10-CM | POA: Diagnosis present

## 2019-10-04 DIAGNOSIS — G9341 Metabolic encephalopathy: Secondary | ICD-10-CM | POA: Diagnosis present

## 2019-10-04 DIAGNOSIS — I6523 Occlusion and stenosis of bilateral carotid arteries: Secondary | ICD-10-CM | POA: Diagnosis not present

## 2019-10-04 DIAGNOSIS — R112 Nausea with vomiting, unspecified: Secondary | ICD-10-CM | POA: Diagnosis not present

## 2019-10-04 DIAGNOSIS — Z9115 Patient's noncompliance with renal dialysis: Secondary | ICD-10-CM

## 2019-10-04 DIAGNOSIS — Z885 Allergy status to narcotic agent status: Secondary | ICD-10-CM

## 2019-10-04 DIAGNOSIS — I10 Essential (primary) hypertension: Secondary | ICD-10-CM | POA: Diagnosis not present

## 2019-10-04 DIAGNOSIS — G40019 Localization-related (focal) (partial) idiopathic epilepsy and epileptic syndromes with seizures of localized onset, intractable, without status epilepticus: Secondary | ICD-10-CM | POA: Diagnosis present

## 2019-10-04 DIAGNOSIS — Z86718 Personal history of other venous thrombosis and embolism: Secondary | ICD-10-CM

## 2019-10-04 DIAGNOSIS — E1121 Type 2 diabetes mellitus with diabetic nephropathy: Secondary | ICD-10-CM | POA: Diagnosis not present

## 2019-10-04 DIAGNOSIS — Z7951 Long term (current) use of inhaled steroids: Secondary | ICD-10-CM

## 2019-10-04 DIAGNOSIS — Z833 Family history of diabetes mellitus: Secondary | ICD-10-CM

## 2019-10-04 DIAGNOSIS — I480 Paroxysmal atrial fibrillation: Secondary | ICD-10-CM | POA: Diagnosis present

## 2019-10-04 DIAGNOSIS — Z832 Family history of diseases of the blood and blood-forming organs and certain disorders involving the immune mechanism: Secondary | ICD-10-CM

## 2019-10-04 DIAGNOSIS — M109 Gout, unspecified: Secondary | ICD-10-CM | POA: Diagnosis present

## 2019-10-04 HISTORY — DX: Metabolic encephalopathy: G93.41

## 2019-10-04 LAB — RESPIRATORY PANEL BY RT PCR (FLU A&B, COVID)
Influenza A by PCR: NEGATIVE
Influenza B by PCR: NEGATIVE
SARS Coronavirus 2 by RT PCR: NEGATIVE

## 2019-10-04 LAB — COMPREHENSIVE METABOLIC PANEL
ALT: 13 U/L (ref 0–44)
AST: 23 U/L (ref 15–41)
Albumin: 4 g/dL (ref 3.5–5.0)
Alkaline Phosphatase: 83 U/L (ref 38–126)
Anion gap: 17 — ABNORMAL HIGH (ref 5–15)
BUN: 60 mg/dL — ABNORMAL HIGH (ref 6–20)
CO2: 26 mmol/L (ref 22–32)
Calcium: 9.4 mg/dL (ref 8.9–10.3)
Chloride: 92 mmol/L — ABNORMAL LOW (ref 98–111)
Creatinine, Ser: 14.87 mg/dL — ABNORMAL HIGH (ref 0.61–1.24)
GFR calc Af Amer: 4 mL/min — ABNORMAL LOW (ref >60)
GFR calc non Af Amer: 3 mL/min — ABNORMAL LOW (ref >60)
Glucose, Bld: 168 mg/dL — ABNORMAL HIGH (ref 70–99)
Potassium: 4.9 mmol/L (ref 3.5–5.1)
Sodium: 135 mmol/L (ref 135–145)
Total Bilirubin: 1.2 mg/dL (ref 0.3–1.2)
Total Protein: 6.5 g/dL (ref 6.5–8.1)

## 2019-10-04 LAB — DIFFERENTIAL
Abs Immature Granulocytes: 0.02 10*3/uL (ref 0.00–0.07)
Basophils Absolute: 0.1 10*3/uL (ref 0.0–0.1)
Basophils Relative: 1 %
Eosinophils Absolute: 0.4 10*3/uL (ref 0.0–0.5)
Eosinophils Relative: 6 %
Immature Granulocytes: 0 %
Lymphocytes Relative: 0 %
Lymphs Abs: 0 10*3/uL — ABNORMAL LOW (ref 0.7–4.0)
Monocytes Absolute: 0.9 10*3/uL (ref 0.1–1.0)
Monocytes Relative: 13 %
Neutro Abs: 5.8 10*3/uL (ref 1.7–7.7)
Neutrophils Relative %: 80 %

## 2019-10-04 LAB — PROTIME-INR
INR: 1 (ref 0.8–1.2)
Prothrombin Time: 13 seconds (ref 11.4–15.2)

## 2019-10-04 LAB — CBC
HCT: UNDETERMINED % (ref 39.0–52.0)
Hemoglobin: UNDETERMINED g/dL (ref 13.0–17.0)
MCH: 34.7 pg — ABNORMAL HIGH (ref 26.0–34.0)
MCHC: UNDETERMINED g/dL (ref 30.0–36.0)
MCV: UNDETERMINED fL (ref 80.0–100.0)
Platelets: 265 10*3/uL (ref 150–400)
RBC: UNDETERMINED MIL/uL (ref 4.22–5.81)
RDW: UNDETERMINED % (ref 11.5–15.5)
WBC: 7.2 10*3/uL (ref 4.0–10.5)
nRBC: 14.1 % — ABNORMAL HIGH (ref 0.0–0.2)

## 2019-10-04 LAB — CBG MONITORING, ED: Glucose-Capillary: 130 mg/dL — ABNORMAL HIGH (ref 70–99)

## 2019-10-04 LAB — I-STAT CHEM 8, ED
BUN: 71 mg/dL — ABNORMAL HIGH (ref 6–20)
Calcium, Ion: 1.08 mmol/L — ABNORMAL LOW (ref 1.15–1.40)
Chloride: 95 mmol/L — ABNORMAL LOW (ref 98–111)
Creatinine, Ser: 15.3 mg/dL — ABNORMAL HIGH (ref 0.61–1.24)
Glucose, Bld: 159 mg/dL — ABNORMAL HIGH (ref 70–99)
HCT: 30 % — ABNORMAL LOW (ref 39.0–52.0)
Hemoglobin: 10.2 g/dL — ABNORMAL LOW (ref 13.0–17.0)
Potassium: 4.9 mmol/L (ref 3.5–5.1)
Sodium: 134 mmol/L — ABNORMAL LOW (ref 135–145)
TCO2: 32 mmol/L (ref 22–32)

## 2019-10-04 LAB — APTT: aPTT: 30 seconds (ref 24–36)

## 2019-10-04 MED ORDER — IOHEXOL 350 MG/ML SOLN
100.0000 mL | Freq: Once | INTRAVENOUS | Status: AC | PRN
  Administered 2019-10-04: 09:00:00 100 mL via INTRAVENOUS

## 2019-10-04 MED ORDER — CLONAZEPAM 0.5 MG PO TABS
1.0000 mg | ORAL_TABLET | Freq: Two times a day (BID) | ORAL | Status: DC
  Administered 2019-10-04 – 2019-10-06 (×5): 1 mg via ORAL
  Filled 2019-10-04 (×5): qty 2

## 2019-10-04 MED ORDER — SORBITOL 70 % SOLN
30.0000 mL | Status: DC | PRN
  Filled 2019-10-04: qty 30

## 2019-10-04 MED ORDER — ALBUTEROL SULFATE (2.5 MG/3ML) 0.083% IN NEBU: 3.0000 mL | INHALATION_SOLUTION | RESPIRATORY_TRACT | Status: DC | PRN

## 2019-10-04 MED ORDER — ACETAMINOPHEN 325 MG PO TABS
650.0000 mg | ORAL_TABLET | Freq: Four times a day (QID) | ORAL | Status: DC | PRN
  Administered 2019-10-06: 13:00:00 650 mg via ORAL
  Filled 2019-10-04: qty 2

## 2019-10-04 MED ORDER — FLUTICASONE FUROATE-VILANTEROL 200-25 MCG/INH IN AEPB
1.0000 | INHALATION_SPRAY | Freq: Every day | RESPIRATORY_TRACT | Status: DC
  Administered 2019-10-05 – 2019-10-06 (×2): 1 via RESPIRATORY_TRACT
  Filled 2019-10-04: qty 28

## 2019-10-04 MED ORDER — RENA-VITE PO TABS
1.0000 | ORAL_TABLET | Freq: Every day | ORAL | Status: DC
  Administered 2019-10-04 – 2019-10-06 (×3): 1 via ORAL
  Filled 2019-10-04 (×3): qty 1

## 2019-10-04 MED ORDER — CAMPHOR-MENTHOL 0.5-0.5 % EX LOTN
1.0000 "application " | TOPICAL_LOTION | Freq: Three times a day (TID) | CUTANEOUS | Status: DC | PRN
  Filled 2019-10-04: qty 222

## 2019-10-04 MED ORDER — KETOROLAC TROMETHAMINE 0.5 % OP SOLN
1.0000 [drp] | Freq: Four times a day (QID) | OPHTHALMIC | Status: DC
  Administered 2019-10-04 – 2019-10-07 (×8): 1 [drp] via OPHTHALMIC
  Filled 2019-10-04: qty 5

## 2019-10-04 MED ORDER — PREDNISOLONE ACETATE 1 % OP SUSP
1.0000 [drp] | Freq: Three times a day (TID) | OPHTHALMIC | Status: DC
  Administered 2019-10-04 – 2019-10-07 (×8): 1 [drp] via OPHTHALMIC

## 2019-10-04 MED ORDER — NEPRO/CARBSTEADY PO LIQD: 237.0000 mL | Freq: Three times a day (TID) | ORAL | Status: DC | PRN

## 2019-10-04 MED ORDER — ONDANSETRON HCL 4 MG/2ML IJ SOLN: 4.0000 mg | Freq: Four times a day (QID) | INTRAMUSCULAR | Status: DC | PRN

## 2019-10-04 MED ORDER — LORATADINE 10 MG PO TABS
10.0000 mg | ORAL_TABLET | Freq: Every day | ORAL | Status: DC
  Administered 2019-10-05 – 2019-10-07 (×3): 10 mg via ORAL
  Filled 2019-10-04 (×3): qty 1

## 2019-10-04 MED ORDER — ONDANSETRON HCL 4 MG/2ML IJ SOLN
INTRAMUSCULAR | Status: AC
  Filled 2019-10-04: qty 2

## 2019-10-04 MED ORDER — ACYCLOVIR 400 MG PO TABS
400.0000 mg | ORAL_TABLET | Freq: Every day | ORAL | Status: DC
  Administered 2019-10-04 – 2019-10-06 (×3): 400 mg via ORAL
  Filled 2019-10-04 (×4): qty 1

## 2019-10-04 MED ORDER — PREDNISOLONE ACETATE 1 % OP SUSP
1.0000 [drp] | Freq: Four times a day (QID) | OPHTHALMIC | Status: DC
  Filled 2019-10-04: qty 5

## 2019-10-04 MED ORDER — OFLOXACIN 0.3 % OP SOLN
1.0000 [drp] | Freq: Four times a day (QID) | OPHTHALMIC | Status: DC
  Administered 2019-10-05 – 2019-10-06 (×6): 1 [drp] via OPHTHALMIC
  Filled 2019-10-04: qty 5

## 2019-10-04 MED ORDER — SODIUM CHLORIDE 0.9% FLUSH: 3.0000 mL | Freq: Once | INTRAVENOUS | Status: DC

## 2019-10-04 MED ORDER — LACOSAMIDE 200 MG PO TABS
200.0000 mg | ORAL_TABLET | Freq: Two times a day (BID) | ORAL | Status: DC
  Administered 2019-10-04 – 2019-10-07 (×7): 200 mg via ORAL
  Filled 2019-10-04 (×6): qty 1

## 2019-10-04 MED ORDER — HEPARIN SODIUM (PORCINE) 5000 UNIT/ML IJ SOLN
5000.0000 [IU] | Freq: Three times a day (TID) | INTRAMUSCULAR | Status: DC
  Administered 2019-10-04 – 2019-10-07 (×7): 5000 [IU] via SUBCUTANEOUS
  Filled 2019-10-04 (×8): qty 1

## 2019-10-04 MED ORDER — ONDANSETRON HCL 4 MG PO TABS: 4.0000 mg | ORAL_TABLET | Freq: Four times a day (QID) | ORAL | Status: DC | PRN

## 2019-10-04 MED ORDER — ZOLPIDEM TARTRATE 5 MG PO TABS: 5.0000 mg | ORAL_TABLET | Freq: Every evening | ORAL | Status: DC | PRN

## 2019-10-04 MED ORDER — CHLORHEXIDINE GLUCONATE CLOTH 2 % EX PADS
6.0000 | MEDICATED_PAD | Freq: Every day | CUTANEOUS | Status: DC
  Administered 2019-10-04 – 2019-10-05 (×2): 6 via TOPICAL

## 2019-10-04 MED ORDER — LAMOTRIGINE 150 MG PO TABS
150.0000 mg | ORAL_TABLET | Freq: Two times a day (BID) | ORAL | Status: DC
  Administered 2019-10-04 – 2019-10-07 (×6): 150 mg via ORAL
  Filled 2019-10-04 (×7): qty 1

## 2019-10-04 MED ORDER — DOCUSATE SODIUM 283 MG RE ENEM
1.0000 | ENEMA | RECTAL | Status: DC | PRN
  Filled 2019-10-04: qty 1

## 2019-10-04 MED ORDER — METOPROLOL TARTRATE 12.5 MG HALF TABLET
12.5000 mg | ORAL_TABLET | Freq: Two times a day (BID) | ORAL | Status: DC
  Administered 2019-10-05 – 2019-10-06 (×2): 12.5 mg via ORAL
  Filled 2019-10-04 (×4): qty 1

## 2019-10-04 MED ORDER — HYDROXYZINE HCL 25 MG PO TABS: 25.0000 mg | ORAL_TABLET | Freq: Three times a day (TID) | ORAL | Status: DC | PRN

## 2019-10-04 MED ORDER — ACETAMINOPHEN 650 MG RE SUPP: 650.0000 mg | Freq: Four times a day (QID) | RECTAL | Status: DC | PRN

## 2019-10-04 MED ORDER — LACOSAMIDE 200 MG PO TABS
200.0000 mg | ORAL_TABLET | ORAL | Status: DC
  Administered 2019-10-05: 17:00:00 200 mg via ORAL
  Filled 2019-10-04: qty 1

## 2019-10-04 MED ORDER — CALCIUM CARBONATE ANTACID 1250 MG/5ML PO SUSP
500.0000 mg | Freq: Four times a day (QID) | ORAL | Status: DC | PRN
  Filled 2019-10-04: qty 5

## 2019-10-04 NOTE — Code Documentation (Addendum)
Stroke Response Nurse Documentation Code Documentation  JABREEL CHIMENTO is a 54 y.o. male arriving to [place] ED via [facility] on 1/26  with past medical hx of HTN, recent SDH, Afib, and previous CVA. Code stroke was activated by EMS. Patient from home where he was LKW noted to be Friday at dialysis and now complaining of increased slurred speech, patient not feeling well after dialysis on Friday. Was unable to go to dialysis Monday r/t nausea and vomiting. Wife called EMS this morning for increasing slurred speech and AMS.  On No antithrombotic. Stroke team at the bedside on patient arrival. Labs drawn and patient cleared for CT by Dr. Regenia Skeeter. Patient to CT with team. NIHSS 9, see documentation for details and code stroke times. Patient with disoriented, left hemianopia, right facial droop, right arm weakness, right decreased sensation, Global aphasia  and dysarthria   on exam. The following imaging was completed: CT,CTA, and  CTP. Patient is not a candidate for tPA due to patient being outside window, LKW >24 hours. Patient brought back to room, placed on cardiac monitor. Patient to be admitted, q2hr neuro assessments. Code stroke canceled by Dr. Rory Percy at 0930. Bedside handoff with ED RN Burman Nieves.    Lianne Bushy A   Stroke Response RN

## 2019-10-04 NOTE — Telephone Encounter (Signed)
Received call from patient's wife stating patient was en route to the ED and patient will not be coming in for appointments today.

## 2019-10-04 NOTE — ED Provider Notes (Addendum)
[facility] EMERGENCY DEPARTMENT Provider Note   CSN: [id number] Arrival date & time: 10/04/19  1505     History No chief complaint on file.   Isaiah [name] is a 54 y.o. male.  The history is provided by the patient. No language interpreter was used.  Weakness Severity:  Severe Onset quality:  Sudden Duration:  1 day Timing:  Constant Progression:  Worsening Chronicity:  New Relieved by:  Nothing Worsened by:  Nothing Ineffective treatments:  None tried  Pt has a history of previous stroke and subdural hematoma.  Pt noted to have increased weakness yesterday. Pt's neurologist had advised ED evaluation. Pt's wife called EMs this am with worsening symptoms, increased arm weakness and increased facial droop.  Pt is a dialysis pt. He has not had dialysis since Friday.  Pt's wife reported pt missed dialysis due to vomiting yesterday.      Past Medical History:  Diagnosis Date  . A-fib ([place])   . Anemia   . Asthma   . Diabetes mellitus without complication ([place])   . DM type 2 (diabetes mellitus, type 2) ([place]) 06/09/2019  . ESRD (end stage renal disease) on dialysis ([place])   . Essential hypertension 06/09/2019  . GIB (gastrointestinal bleeding)    Recurrent episodes- 09/2014, 09/2015 and 05/2016  . Gout   . History of recent blood transfusion 10/27/14   2 Units PRBC's  . Hyperkalemia 07/2011  . Hypertension   . Monoclonal gammopathy   . Multiple myeloma ([place])   . OSA on CPAP   . Pulmonary emboli ([place]) 07/2011   bilateral--treated with comadin X 6 months  . Pulmonary embolism ([place]) 07/2011; 09/27/2014   a. Bilat PE 07/2011 - unclear cause, tx with 6 months Coumadin.;   . Seizure disorder ([place]) 06/09/2019  . Seizures ([place])   . Sepsis ([place])   . Sickle cell-thalassemia disease ([place])    a. Sickle cell trait.  . Sleep apnea   . Stroke ([place])   . Stroke ([place]) 09/2015   R-MCA, L-MCA, PCA and bilateral cerebellar complicated by DVT/PE  . Subdural hematoma ([place])  05/2019    Patient Active Problem List   Diagnosis Date Noted  . Abnormality of gait 09/13/2019  . Diabetes mellitus with end-stage renal disease ([place]) 08/07/2019  . Benign hypertensive kidney disease with chronic kidney disease 08/07/2019  . Spastic hemiparesis ([place]) 07/12/2019  . Dysphagia, oropharyngeal phase 07/05/2019  . Allergy, unspecified, initial encounter 06/27/2019  . PAF (paroxysmal atrial fibrillation) ([place])   . Bacteremia   . Labile blood pressure   . Labile blood glucose   . Controlled type 2 diabetes mellitus with hyperglycemia, without long-term current use of insulin ([place])   . ESRD on dialysis ([place])   . Right sided weakness   . SDH (subdural hematoma) ([place])   . Acute on chronic intracranial subdural hematoma (HCC) 06/09/2019  . Multiple myeloma not having achieved remission ([place]) 06/09/2019  . ESRD on hemodialysis ([place]) 06/09/2019  . Essential hypertension 06/09/2019  . DM type 2 (diabetes mellitus, type 2) ([place]) 06/09/2019  . Seizure disorder ([place]) 06/09/2019  . Aspiration pneumonia ([place]) 06/09/2019  . History of bacteremia 06/09/2019  . Atrial fibrillation, chronic ([place]) 06/09/2019  . Bilateral kidney stones 05/31/2019  . Proteus infection 05/28/2019  . History of restrictive pulmonary disease 04/07/2019  . Multiple myeloma ([place]) 05/17/2018  . Goals of care, counseling/discussion 05/17/2018  . Partial idiopathic epilepsy with seizures of localized onset, intractable, without status epilepticus ([place]) 11/02/2017  .  Iron deficiency anemia, unspecified 01/22/2017  . COPD (chronic obstructive pulmonary disease) ([place]) 11/11/2016  . CVA (cerebral vascular accident) ([place]) 11/11/2016  . GERD (gastroesophageal reflux disease) 11/11/2016  . GI bleed 11/11/2016  . 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  . Obesity (BMI 30-39.9) 11/11/2016  . Pulmonary hypertension ([place]) 11/11/2016  . Patent foramen ovale 11/11/2016  . Hyperparathyroidism, secondary renal  ([place]) 11/11/2016  . VTE (venous thromboembolism) 11/11/2016  . Hypotension 11/11/2016  . Systemic hypertension 11/11/2016  . Seizure ([place]) 11/11/2016  . Other seizures ([place]) 07/25/2016  . Encounter for removal of sutures 05/27/2016  . Ingrown nail 03/03/2016  . Onychomycosis due to dermatophyte 03/03/2016  . Other acquired hammer toe 03/03/2016  . Hemiparesis affecting right side as late effect of cerebrovascular accident ([place]) 02/28/2016  . Aphasia complicating stroke 21/19/4174  . Seizures ([place]) 01/08/2016  . History of ischemic left MCA stroke 01/08/2016  . Right sided weakness 01/08/2016  . Atrial fibrillation ([place]) 01/08/2016  . Leukocytosis 01/08/2016  . Benign essential HTN 01/08/2016  . Anemia of chronic disease 01/08/2016  . Controlled type 2 diabetes mellitus with diabetic nephropathy, without long-term current use of insulin ([place]) 01/08/2016  . History of DVT (deep vein thrombosis) 01/08/2016  . 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 Hypocalcemia 12/26/2015  . Long term (current) use of anticoagulants 11/07/2015  . Aftercare including intermittent dialysis ([place]) 11/02/2015  . Other specified coagulation defects ([place]) 11/02/2015  . Complication of vascular dialysis catheter 11/02/2015  . Diarrhea, unspecified 11/02/2015  . Fever, unspecified 11/02/2015  . Pruritus, unspecified 11/02/2015  . Shortness of breath 11/02/2015  . Type 2 diabetes mellitus with diabetic peripheral angiopathy without gangrene ([place]) 11/02/2015  . ESRD (end stage renal disease) on dialysis [facility])     Past Surgical History:  Procedure Laterality Date  . AV FISTULA PLACEMENT Right 12/05/2015   Procedure: INSERTION OF ARTERIOVENOUS (AV) GORE-TEX GRAFT ARM;  Surgeon: Elam Dutch, MD;  Location: [facility];  Service: Vascular;  Laterality: Right;  . BONE MARROW BIOPSY    . CHOLECYSTECTOMY    . CHOLECYSTECTOMY  1990's?  . COLONOSCOPY WITH PROPOFOL N/A 01/22/2017   Procedure: COLONOSCOPY WITH  PROPOFOL;  Surgeon: Wilford Corner, MD;  Location: [facility] ENDOSCOPY;  Service: Endoscopy;  Laterality: N/A;  . EYE SURGERY Right   . INSERTION OF DIALYSIS CATHETER Right 10/16/2015   Procedure: INSERTION OF PALINDROME DIALYSIS CATHETER ;  Surgeon: Elam Dutch, MD;  Location: [facility];  Service: Vascular;  Laterality: Right;  . OTHER SURGICAL HISTORY     Retinal surgery  . PARS PLANA VITRECTOMY  02/17/2012   Procedure: PARS PLANA VITRECTOMY WITH 25 GAUGE;  Surgeon: Hayden Pedro, MD;  Location: [facility];  Service: Ophthalmology;  Laterality: Right;  . PERIPHERAL VASCULAR CATHETERIZATION N/A 03/20/2016   Procedure: A/V Shuntogram/Fistulagram;  Surgeon: Conrad [place], MD;  Location: [facility] CV LAB;  Service: Cardiovascular;  Laterality: N/A;  . TEE WITHOUT CARDIOVERSION N/A 12/02/2016   Procedure: TRANSESOPHAGEAL ECHOCARDIOGRAM (TEE);  Surgeon: Larey Dresser, MD;  Location: [name] ENDOSCOPY;  Service: Cardiovascular;  Laterality: N/A;       Family History  Problem Relation Age of Onset  . Hypertension Mother   . Hypertension Father   . Stroke Father   . Sickle cell anemia Brother   . Diabetes Mother   . Sickle cell anemia Brother   . Diabetes type I Brother   . Kidney disease Brother     Social History   Tobacco Use  .  Smoking status: Never Smoker  . Smokeless tobacco: Never Used  Substance Use Topics  . Alcohol use: Not Currently  . Drug use: Never    Home Medications Prior to Admission medications   Medication Sig Start Date End Date Taking? Authorizing Provider  acetaminophen (TYLENOL) 325 MG tablet Take 1-2 tablets (325-650 mg total) by mouth every 4 (four) hours as needed for mild pain. 06/20/19   Love, Ivan Anchors, PA-C  acyclovir (ZOVIRAX) 400 MG tablet Take 400 mg by mouth at bedtime.    [provider]  albuterol (PROAIR HFA) 108 (90 Base) MCG/ACT inhaler Inhale two puffs every 4-6 hours if needed for cough or wheeze 04/08/16   Kozlow, Donnamarie Poag, MD  amLODipine  (NORVASC) 2.5 MG tablet Take 1 tablet (2.5 mg total) by mouth at bedtime. Patient taking differently: Take 2.5 mg by mouth at bedtime. ON HOLD PER WIFE 06/30/19   Love, Ivan Anchors, PA-C  calcium carbonate (TUMS - DOSED IN MG ELEMENTAL CALCIUM) 500 MG chewable tablet Chew 2 tablets by mouth at bedtime.    [provider]  camphor-menthol Timoteo Ace) lotion Apply 1 application topically every 8 (eight) hours as needed for itching. 06/30/19   Love, Ivan Anchors, PA-C  Cetirizine HCl 10 MG CAPS Take 1 capsule by mouth daily.    [provider]  cinacalcet (SENSIPAR) 60 MG tablet Take 60 mg by mouth every evening. ON HOLD FOR DIALYSIS PER WIFE    [provider]  ciprofloxacin (CIPRO) 500 MG tablet Take 500 mg by mouth every evening. For 120 days 06/06/19   [provider]  clonazePAM Bobbye [place]) 0.5 MG tablet Take 2 tablets every Monday, Wednesday, and Friday with hemodialysis 08/08/19   Cameron Sprang, MD  dexamethasone (DECADRON) 4 MG tablet Take 5 tablets (20 mg total) by mouth once a week. Take 5 tablets (20 mg total) by mouth once a week on day of chemo. 09/06/19   Wyatt Portela, MD  fluticasone furoate-vilanterol (BREO ELLIPTA) 200-25 MCG/INH AEPB Inhale 1 puff into the lungs daily.    [provider]  hydrOXYzine (ATARAX/VISTARIL) 25 MG tablet Take 1 tablet (25 mg total) by mouth every 8 (eight) hours as needed for itching. 06/30/19   Love, Ivan Anchors, PA-C  lacosamide (VIMPAT) 200 MG TABS tablet Take 1 tablet twice a day except on dialysis days M-W-F give 1 tab in AM, 1 tab after dialysis, 1 tab in PM. 07/12/19   Cameron Sprang, MD  lamoTRIgine (LAMICTAL) 150 MG tablet Take 1 tablet (150 mg total) by mouth 2 (two) times daily. 07/12/19   Cameron Sprang, MD  metoprolol tartrate (LOPRESSOR) 25 MG tablet Take 12.5 mg by mouth 2 (two) times daily.    [provider]  multivitamin (RENA-VIT) TABS tablet Take 1 tablet by mouth at bedtime.    [provider]  pantoprazole (PROTONIX) 40 MG tablet Take 40 mg by mouth daily.    [provider]  prochlorperazine (COMPAZINE) 10 MG tablet TAKE 1 TABLET (10 MG TOTAL) BY MOUTH EVERY 6 (SIX) HOURS AS NEEDED FOR NAUSEA OR VOMITING. 11/15/18   Wyatt Portela, MD    Allergies    Codeine and Codeine  Review of Systems   Review of Systems  Neurological: Positive for weakness.  All other systems reviewed and are negative.   Physical Exam Updated Vital Signs There were no vitals taken for this visit.  Physical Exam Vitals and nursing note reviewed.  Constitutional:  Appearance: He is well-developed.  HENT:     Head: Normocephalic.     Right Ear: External ear normal.     Left Ear: External ear normal.     Mouth/Throat:     Mouth: Mucous membranes are moist.     Comments: facail droop, speech slurred  Eyes:     Pupils: Pupils are equal, round, and reactive to light.  Cardiovascular:     Rate and Rhythm: Normal rate.  Pulmonary:     Effort: Pulmonary effort is normal.  Abdominal:     General: There is no distension.  Musculoskeletal:     Cervical back: Normal range of motion.     Comments: Good range of motion both arms   Skin:    General: Skin is warm.  Neurological:     Mental Status: He is alert and oriented to person, place, and time.  Psychiatric:        Mood and Affect: Mood normal.     ED Results / Procedures / Treatments   Labs (all labs ordered are listed, but only abnormal results are displayed) Labs Reviewed  I-STAT CHEM 8, ED - Abnormal; Notable for the following components:      Result Value   Sodium 134 (*)    Chloride 95 (*)    BUN 71 (*)    Creatinine, Ser 15.30 (*)    Glucose, Bld 159 (*)    Calcium, Ion 1.08 (*)    Hemoglobin 10.2 (*)    HCT 30.0 (*)    All other components within normal limits  CBG MONITORING, ED - Abnormal; Notable for the following components:   Glucose-Capillary 130 (*)    All other components within normal  limits  PROTIME-INR  APTT  CBC  DIFFERENTIAL  COMPREHENSIVE METABOLIC PANEL    EKG None  Radiology CT HEAD CODE STROKE WO CONTRAST  Result Date: 10/04/2019 CLINICAL DATA:  Code stroke.  Slurred speech. EXAM: CT HEAD WITHOUT CONTRAST TECHNIQUE: Contiguous axial images were obtained from the base of the skull through the vertex without intravenous contrast. COMPARISON:  07/14/2019 FINDINGS: Brain: A subdural hematoma laterally over the right cerebral convexity demonstrates increased density compared to the prior study though remains hypoattenuating relative to cortex and has not significantly changed in size with a maximal thickness of 9 mm. No definite acute/hyperdense blood products are identified. Leftward midline shift measures 3 mm, unchanged. Chronic right occipital, left frontal, left temporal lobe, and left cerebellar infarcts are unchanged. Hypodensities elsewhere in the cerebral white matter bilaterally and in the pons are nonspecific but compatible with mild chronic small vessel ischemic disease. There is mild cerebral atrophy. Vascular: Calcified atherosclerosis at the skull base. No hyperdense vessel. Skull: No fracture suspicious osseous lesion. Sinuses/Orbits: Unchanged left maxillary sinus mucous retention cyst. Clear mastoid air cells. Bilateral cataract extraction. Other: Unchanged 1.6 cm left suboccipital sebaceous cyst. ASPECTS (Murphy Stroke Program Early CT Score) - Ganglionic level infarction (caudate, lentiform nuclei, internal capsule, insula, M1-M3 cortex): 7 - Supraganglionic infarction (M4-M6 cortex): 3 Total score (0-10 with 10 being normal): 10 IMPRESSION: 1. No acute infarct identified.  ASPECTS of 10. 2. Chronic subdural hematoma over the right cerebral convexity, mildly increased in density compared to the prior CT though unchanged in size and without definite acute hemorrhage. Unchanged 3 mm of leftward midline shift. 3. Chronic bilateral cerebral and left cerebellar  infarcts. These results were communicated to Dr. Rory Percy at 9:21 am on 10/04/2019 by text page via the [facility] messaging system.  Electronically Signed   By: Logan Bores M.D.   On: 10/04/2019 09:22    Procedures .Critical Care Performed by: Fransico Meadow, PA-C Authorized by: Fransico Meadow, PA-C   Critical care provider statement:    Critical care time (minutes):  45   Critical care was time spent personally by me on the following activities:  Discussions with consultants, evaluation of patient's response to treatment, examination of patient, ordering and performing treatments and interventions, ordering and review of laboratory studies, ordering and review of radiographic studies, pulse oximetry, re-evaluation of patient's condition, obtaining history from patient or surrogate and review of old charts   I assumed direction of critical care for this patient from another provider in my specialty: no     (including critical care time)  Medications Ordered in ED Medications  sodium chloride flush (NS) 0.9 % injection 3 mL (has no administration in time range)  ondansetron (ZOFRAN) 4 MG/2ML injection (has no administration in time range)  iohexol (OMNIPAQUE) 350 MG/ML injection 100 mL (100 mLs Intravenous Contrast Given 10/04/19 1700)    ED Course  I have reviewed the triage vital signs and the nursing notes.  Pertinent labs & imaging results that were available during my care of the patient were reviewed by me and considered in my medical decision making (see chart for details).    MDM Rules/Calculators/A&P                      MDM: Patient initially evaluated as a code stroke he was seen by Dr. Rory Percy at triage and evaluated patient had a CT head for code stroke CT angio head CT angio neck and a CT perfusion scan.  Scans are consistent with patient's previous history.  No acute EVA is noted.  Dr. Rory Percy felt patient's symptoms were secondary to metabolic encephalopathy due to from missing  dialysis.  He advised hemodialysis correcting metabolic abnormalities and blood cultures.  He does not believe patient needs MRI at this time.  Advise reevaluation after dialysis Pt given zofran iv here for vomiting.  Ct reviewed, labs reviewed.   Nephrology and Medicine consulted for admission  Dr. Jonnie Finner advised he will arrange dialysis   Dr. Lorin Mercy triad hospitalist will admit  Final Clinical Impression(s) / ED Diagnoses Final diagnoses:  Encephalopathy  Weakness  Nausea and vomiting, intractability of vomiting not specified, unspecified vomiting type    Rx / DC Orders ED Discharge Orders    None       Fransico Meadow, PA-C 10/04/19 1139    Sidney Ace 10/04/19 1200    Sherwood Gambler, MD 10/05/19 [phone]

## 2019-10-04 NOTE — Telephone Encounter (Signed)
Patient's wife called in to let Dr. Delice Lesch know that Isaiah [name]'s symptoms did not get any better and that he is on his way to the ED now. Thank you

## 2019-10-04 NOTE — Consult Note (Addendum)
[facility] Renal Consultation Note    Indication for Consultation:  Management of ESRD/hemodialysis; anemia, hypertension/volume and secondary hyperparathyroidism  BPZ:WCHENID, Isaiah Mech, MD  HPI: Isaiah [name] is a 54 y.o. male. ESRD 2/2 DM/HTN on HD MWF at [facility], first starting in 10/2015.  Past medical history significant for seizure d/o, CVA, OSA on CPAP, Hx PE/DVT off coumadin d/t GIB, multiple myeloma not in remission, HTN, DM, A fib, and chronic SDH.  Recent admission to [name] in Sept 2020 for stem cell transplant but procedure aborted due to seizures and findings of acute on chronic SDH, proteus mirabilis bacteremia, aspiration PNA and b/l renal calculi.  Of note, patient is mostly compliant with prescribed dialysis regimen, last dialysis on 09/30/19.  Majority of history obtained from chart review due to patient altered mental status.  Seen and examined in dialysis.  Patient presented to the ED due to change in mental status, dysarthria and R sided facial droop.  Wife noted increase in slurred speech following dialysis on Friday and n/v starting yesterday.  Neurology recommended he go to ED yesterday for CT but she did not take him for fear of COVID.  Today he states he feels ok.  Denies SOB, CP, n/v/d, abdominal pain, weakness and fatigue.  When asked why he came to the hospital he said "because I had a stroke."  When asked when was your last dialysis he answered "I go Monday Wednesday Friday." He was unsure why he did not go to dialysis yesterday but per chart review it was due to n/v.     Pertinent findings in the ED include COVID negative, CXR with no acute findings, CTA of head and neck shows no large vessel occlusion or hemodynamically significant stenosis or evidence of territory at risk by perfusion imaging; CT head with chronic SDH with mildly increased density but unchanged in size, no signs of acute hemorrhage and unchanged 18m of leftward midline shift. Patient has been  admitted for further evaluation and treatment.    Past Medical History:  Diagnosis Date  . A-fib ([place]   . Anemia   . Asthma   . DM type 2 (diabetes mellitus, type 2) ([place] 06/09/2019  . ESRD (end stage renal disease) on dialysis ([place]   . Essential hypertension 06/09/2019  . GIB (gastrointestinal bleeding)    Recurrent episodes- 09/2014, 09/2015 and 05/2016  . Gout   . History of recent blood transfusion 10/27/14   2 Units PRBC's  . Hyperkalemia 07/2011  . Multiple myeloma ([place]   . OSA on CPAP   . Pulmonary embolism ([place] 07/2011; 09/27/2014   a. Bilat PE 07/2011 - unclear cause, tx with 6 months Coumadin.;   . Seizure disorder ([place] 06/09/2019  . Sepsis ([place]   . Sickle cell-thalassemia disease ([place]    a. Sickle cell trait.  . Sleep apnea   . Stroke ([place] 09/2015   R-MCA, L-MCA, PCA and bilateral cerebellar complicated by DVT/PE  . Subdural hematoma ([place] 05/2019   Past Surgical History:  Procedure Laterality Date  . AV FISTULA PLACEMENT Right 12/05/2015   Procedure: INSERTION OF ARTERIOVENOUS (AV) GORE-TEX GRAFT ARM;  Surgeon: CElam Dutch MD;  [place] [facility]  Service: Vascular;  Laterality: Right;  . BONE MARROW BIOPSY    . CHOLECYSTECTOMY    . CHOLECYSTECTOMY  1990's?  . COLONOSCOPY WITH PROPOFOL N/A 01/22/2017   Procedure: COLONOSCOPY WITH PROPOFOL;  Surgeon: SWilford Corner MD;  [facility]: [facility];  Service: Endoscopy;  Laterality: N/A;  .  EYE SURGERY Right   . INSERTION OF DIALYSIS CATHETER Right 10/16/2015   Procedure: INSERTION OF PALINDROME DIALYSIS CATHETER ;  Surgeon: Isaiah Dutch, MD;  Location: [place];  Service: Vascular;  Laterality: Right;  . OTHER SURGICAL HISTORY     Retinal surgery  . PARS PLANA VITRECTOMY  02/17/2012   Procedure: PARS PLANA VITRECTOMY WITH 25 GAUGE;  Surgeon: Isaiah Pedro, MD;  Location: [place];  Service: Ophthalmology;  Laterality: Right;  . PERIPHERAL VASCULAR CATHETERIZATION N/A 03/20/2016   Procedure: A/V  Shuntogram/Fistulagram;  Surgeon: Isaiah [place], MD;  Location: [place] CV [facility];  Service: Cardiovascular;  Laterality: N/A;  . TEE WITHOUT CARDIOVERSION N/A 12/02/2016   Procedure: TRANSESOPHAGEAL ECHOCARDIOGRAM (TEE);  Surgeon: Isaiah Dresser, MD;  Location: [facility] ENDOSCOPY;  Service: Cardiovascular;  Laterality: N/A;   Family History  Problem Relation Age of Onset  . Hypertension Mother   . Hypertension Father   . Stroke Father   . Sickle cell anemia Brother   . Diabetes Mother   . Sickle cell anemia Brother   . Diabetes type I Brother   . Kidney disease Brother    Social History:  reports that he has never smoked. He has never used smokeless tobacco. He reports previous alcohol use. He reports that he does not use drugs. Allergies  Allergen Reactions  . Codeine Rash and Other (See Comments)    Unknown reaction (patient says it was more serious than just a rash, but he can't remember what happened) Unknown reaction (patient says it was more serious than just a rash, but he can't remember what happened)  . Codeine Swelling   Prior to Admission medications   Medication Sig Start Date End Date Taking? Authorizing Provider  acetaminophen (TYLENOL) 325 MG tablet Take 1-2 tablets (325-650 mg total) by mouth every 4 (four) hours as needed for mild pain. 06/20/19  Yes [name], Isaiah Anchors, PA-C  acyclovir (ZOVIRAX) 400 MG tablet Take 400 mg by mouth at bedtime.   Yes [provider]  albuterol (PROAIR HFA) 108 (90 Base) MCG/ACT inhaler Inhale two puffs every 4-6 hours if needed for cough or wheeze 04/08/16  Yes [name], Isaiah Poag, MD  camphor-menthol [facility]) lotion Apply 1 application topically every 8 (eight) hours as needed for itching. 06/30/19  Yes [name], Isaiah Anchors, PA-C  Cetirizine HCl 10 MG CAPS Take 1 capsule by mouth daily.   Yes [provider]  clonazePAM (KLONOPIN) 0.5 MG tablet Take 2 tablets every Monday, Wednesday, and Friday with hemodialysis 08/08/19  Yes Cameron Sprang,  MD  Daratumumab-Hyaluronidase-fihj (DARZALEX FASPRO [place]) Inject 1 Dose into the skin once a week. On Tuesday   Yes [provider]  dexamethasone (DECADRON) 4 MG tablet Take 5 tablets (20 mg total) by mouth once a week. Take 5 tablets (20 mg total) by mouth once a week on Isaiah of chemo. 09/06/19  Yes Wyatt Portela, MD  fluticasone furoate-vilanterol (BREO ELLIPTA) 200-25 MCG/INH AEPB Inhale 1 puff into the lungs daily.   Yes [provider]  hydrOXYzine (ATARAX/VISTARIL) 25 MG tablet Take 1 tablet (25 mg total) by mouth every 8 (eight) hours as needed for itching. 06/30/19  Yes [name], Isaiah Anchors, PA-C  Insulin Lispro (HUMALOG KWIKPEN [place]) Inject 2-4 Units into the skin as needed (to control Glucose). Sliding scale: 175-200 =2 units, 201-250=3 units,-251 += 4 units   Yes [provider]  ketorolac (ACULAR) 0.5 % ophthalmic solution Place 1 drop into both eyes 4 (four)  times daily. 09/22/19  Yes [provider]  lacosamide (VIMPAT) 200 MG TABS tablet Take 1 tablet twice a Isaiah except on dialysis days M-W-F give 1 tab in AM, 1 tab after dialysis, 1 tab in PM. 07/12/19  Yes Cameron Sprang, MD  lamoTRIgine (LAMICTAL) 150 MG tablet Take 1 tablet (150 mg total) by mouth 2 (two) times daily. 07/12/19  Yes Cameron Sprang, MD  metoprolol tartrate (LOPRESSOR) 25 MG tablet Take 12.5 mg by mouth 2 (two) times daily.   Yes [provider]  multivitamin (RENA-VIT) TABS tablet Take 1 tablet by mouth at bedtime.   Yes [provider]  ofloxacin (OCUFLOX) 0.3 % ophthalmic solution Place 1 drop into the left eye 4 (four) times daily. 09/15/19  Yes [provider]  pantoprazole (PROTONIX) 40 MG tablet Take 40 mg by mouth daily.   Yes [provider]  prednisoLONE acetate (PRED FORTE) 1 % ophthalmic suspension Place 1 drop into the left eye 4 (four) times daily. 09/15/19  Yes [provider]  prochlorperazine (COMPAZINE) 10 MG tablet TAKE 1 TABLET (10  MG TOTAL) BY MOUTH EVERY 6 (SIX) HOURS AS NEEDED FOR NAUSEA OR VOMITING. 11/15/18  Yes [name], Mathis Dad, MD  amLODipine (NORVASC) 2.5 MG tablet Take 1 tablet (2.5 mg total) by mouth at bedtime. Patient taking differently: Take 2.5 mg by mouth at bedtime. ON HOLD PER WIFE 06/30/19   [name], Isaiah Anchors, PA-C  calcium carbonate (TUMS - DOSED IN MG ELEMENTAL CALCIUM) 500 MG chewable tablet Chew 2 tablets by mouth at bedtime.    [provider]  cinacalcet (SENSIPAR) 60 MG tablet Take 60 mg by mouth every evening. ON HOLD FOR DIALYSIS PER WIFE    [provider]   Current Facility-Administered Medications  Medication Dose Route Frequency Provider Last Rate Last Admin  . acetaminophen (TYLENOL) tablet 650 mg  650 mg Oral Q6H PRN Karmen Bongo, MD       Or  . acetaminophen (TYLENOL) suppository 650 mg  650 mg Rectal Q6H PRN Karmen Bongo, MD      . acyclovir (ZOVIRAX) tablet 400 mg  400 mg Oral QHS Karmen Bongo, MD      . calcium carbonate (dosed in mg elemental calcium) suspension 500 mg of elemental calcium  500 mg of elemental calcium Oral Q6H PRN Karmen Bongo, MD      . camphor-menthol [facility]) lotion 1 application  1 application Topical F6O PRN Karmen Bongo, MD       And  . hydrOXYzine (ATARAX/VISTARIL) tablet 25 mg  25 mg Oral Q8H PRN Karmen Bongo, MD      . Chlorhexidine Gluconate Cloth 2 % PADS 6 each  6 each Topical Q0600 Karmen Bongo, MD      . docusate sodium [facility]) enema 283 mg  1 enema Rectal PRN Karmen Bongo, MD      . feeding supplement (NEPRO CARB STEADY) liquid 237 mL  237 mL Oral TID PRN Karmen Bongo, MD      . heparin injection 5,000 Units  5,000 Units Subcutaneous Camelia Phenes Karmen Bongo, MD      . metoprolol tartrate (LOPRESSOR) tablet 12.5 mg  12.5 mg Oral BID Karmen Bongo, MD      . ondansetron [facility]) tablet 4 mg  4 mg Oral Q6H PRN Karmen Bongo, MD       Or  . ondansetron [facility]) injection 4 mg  4 mg Intravenous Q6H PRN Karmen Bongo, MD      .  sorbitol 70 % solution 30 mL  30 mL Oral PRN Karmen Bongo, MD      . zolpidem [facility]) tablet 5 mg  5 mg Oral QHS PRN Karmen Bongo, MD       Labs: Basic Metabolic Panel: Recent Labs  Lab 10/04/19 0900 10/04/19 0915  NA 135 134*  K 4.9 4.9  CL 92* 95*  CO2 26  --   GLUCOSE 168* 159*  BUN 60* 71*  CREATININE 14.87* 15.30*  CALCIUM 9.4  --    Liver Function Tests: Recent Labs  Lab 10/04/19 0900  AST 23  ALT 13  ALKPHOS 83  BILITOT 1.2  PROT 6.5  ALBUMIN 4.0   CBC: Recent Labs  Lab 10/04/19 0900 10/04/19 0915  WBC 7.2  --   NEUTROABS 5.8  --   HGB Unable to determine due to a cold agglutinin 10.2*  HCT Unable to determine due to a cold agglutinin 30.0*  MCV Unable to determine due to a cold agglutinin  --   PLT 265  --    CBG: Recent Labs  Lab 10/04/19 0900  GLUCAP 130*   Studies/Results: CT Code Stroke CTA Head W/WO contrast  Result Date: 10/04/2019 CLINICAL DATA:  Code stroke, follow-up EXAM: CT ANGIOGRAPHY HEAD AND NECK CT PERFUSION BRAIN TECHNIQUE: Multidetector CT imaging of the head and neck was performed using the standard protocol during bolus administration of intravenous contrast. Multiplanar CT image reconstructions and MIPs were obtained to evaluate the vascular anatomy. Carotid stenosis measurements (when applicable) are obtained utilizing NASCET criteria, using the distal internal carotid diameter as the denominator. Multiphase CT imaging of the brain was performed following IV bolus contrast injection. Subsequent parametric perfusion maps were calculated using RAPID software. CONTRAST:  142m OMNIPAQUE IOHEXOL 350 MG/ML SOLN COMPARISON:  None. FINDINGS: CTA NECK FINDINGS Aortic arch: Great vessel origins are patent. Right carotid system: Patent. Minimal calcified plaque at the ICA origin without measurable stenosis. Left carotid system: Patent. Mild calcified and noncalcified plaque at the ICA origin causing minimal stenosis.  Vertebral arteries: Patent and codominant. No stenosis or evidence of dissection. Skeleton: Mild degenerative changes of the cervical spine. Other neck: A lipoma is present in the right neck mildly displacing the submandibular gland. No adenopathy. Upper chest: No apical lung mass. Review of the MIP images confirms the above findings CTA HEAD FINDINGS Anterior circulation: Intracranial internal carotid arteries patent with mild calcified plaque. Anterior and middle cerebral arteries are patent. Left A1 ACA is dominant. Posterior circulation: Intracranial vertebral arteries, basilar artery, and posterior cerebral arteries are patent. A left posterior communicating artery is present. Venous sinuses: As permitted by contrast timing, patent. Review of the MIP images confirms the above findings CT Brain Perfusion Findings: CBF (<30%) Volume: 088m There is expected decreased CBF and CBV corresponding to the chronic infarcts on CT. Perfusion (Tmax>6.0s) volume: 31m27mismatch Volume: 31mL55mfarction Location: None IMPRESSION: No large vessel occlusion or hemodynamically significant stenosis. No evidence of territory at risk by perfusion imaging. Electronically Signed   By: PranMacy Mis.   On: 10/04/2019 09:45   CT Code Stroke CTA Neck W/WO contrast  Result Date: 10/04/2019 CLINICAL DATA:  Code stroke, follow-up EXAM: CT ANGIOGRAPHY HEAD AND NECK CT PERFUSION BRAIN TECHNIQUE: Multidetector CT imaging of the head and neck was performed using the standard protocol during bolus administration of intravenous contrast. Multiplanar CT image reconstructions and MIPs were obtained to evaluate the vascular anatomy. Carotid stenosis measurements (when applicable) are obtained utilizing NASCET criteria,  using the distal internal carotid diameter as the denominator. Multiphase CT imaging of the brain was performed following IV bolus contrast injection. Subsequent parametric perfusion maps were calculated using RAPID software.  CONTRAST:  179m OMNIPAQUE IOHEXOL 350 MG/ML SOLN COMPARISON:  None. FINDINGS: CTA NECK FINDINGS Aortic arch: Great vessel origins are patent. Right carotid system: Patent. Minimal calcified plaque at the ICA origin without measurable stenosis. Left carotid system: Patent. Mild calcified and noncalcified plaque at the ICA origin causing minimal stenosis. Vertebral arteries: Patent and codominant. No stenosis or evidence of dissection. Skeleton: Mild degenerative changes of the cervical spine. Other neck: A lipoma is present in the right neck mildly displacing the submandibular gland. No adenopathy. Upper chest: No apical lung mass. Review of the MIP images confirms the above findings CTA HEAD FINDINGS Anterior circulation: Intracranial internal carotid arteries patent with mild calcified plaque. Anterior and middle cerebral arteries are patent. Left A1 ACA is dominant. Posterior circulation: Intracranial vertebral arteries, basilar artery, and posterior cerebral arteries are patent. A left posterior communicating artery is present. Venous sinuses: As permitted by contrast timing, patent. Review of the MIP images confirms the above findings CT Brain Perfusion Findings: CBF (<30%) Volume: 049m There is expected decreased CBF and CBV corresponding to the chronic infarcts on CT. Perfusion (Tmax>6.0s) volume: 48m73mismatch Volume: 48mL42mfarction Location: None IMPRESSION: No large vessel occlusion or hemodynamically significant stenosis. No evidence of territory at risk by perfusion imaging. Electronically Signed   By: PranMacy Mis.   On: 10/04/2019 09:45   CT Code Stroke Cerebral Perfusion with contrast  Result Date: 10/04/2019 CLINICAL DATA:  Code stroke, follow-up EXAM: CT ANGIOGRAPHY HEAD AND NECK CT PERFUSION BRAIN TECHNIQUE: Multidetector CT imaging of the head and neck was performed using the standard protocol during bolus administration of intravenous contrast. Multiplanar CT image reconstructions and  MIPs were obtained to evaluate the vascular anatomy. Carotid stenosis measurements (when applicable) are obtained utilizing NASCET criteria, using the distal internal carotid diameter as the denominator. Multiphase CT imaging of the brain was performed following IV bolus contrast injection. Subsequent parametric perfusion maps were calculated using RAPID software. CONTRAST:  1048mL52mIPAQUE IOHEXOL 350 MG/ML SOLN COMPARISON:  None. FINDINGS: CTA NECK FINDINGS Aortic arch: Great vessel origins are patent. Right carotid system: Patent. Minimal calcified plaque at the ICA origin without measurable stenosis. Left carotid system: Patent. Mild calcified and noncalcified plaque at the ICA origin causing minimal stenosis. Vertebral arteries: Patent and codominant. No stenosis or evidence of dissection. Skeleton: Mild degenerative changes of the cervical spine. Other neck: A lipoma is present in the right neck mildly displacing the submandibular gland. No adenopathy. Upper chest: No apical lung mass. Review of the MIP images confirms the above findings CTA HEAD FINDINGS Anterior circulation: Intracranial internal carotid arteries patent with mild calcified plaque. Anterior and middle cerebral arteries are patent. Left A1 ACA is dominant. Posterior circulation: Intracranial vertebral arteries, basilar artery, and posterior cerebral arteries are patent. A left posterior communicating artery is present. Venous sinuses: As permitted by contrast timing, patent. Review of the MIP images confirms the above findings CT Brain Perfusion Findings: CBF (<30%) Volume: 48mL. 748mre is expected decreased CBF and CBV corresponding to the chronic infarcts on CT. Perfusion (Tmax>6.0s) volume: 48mL Mi27mtch Volume: 48mL Inf32mtion Location: None IMPRESSION: No large vessel occlusion or hemodynamically significant stenosis. No evidence of territory at risk by perfusion imaging. Electronically Signed   By: Praneil Macy MisOn: 10/04/2019 09:45    DG  Chest Portable 1 View  Result Date: 10/04/2019 CLINICAL DATA:  Weakness. EXAM: PORTABLE CHEST 1 VIEW COMPARISON:  06/09/2019 FINDINGS: Numerous leads and wires project over the chest. Patient rotated left. Midline trachea. Mild cardiomegaly. No pleural effusion or pneumothorax. Clear lungs. IMPRESSION: No acute cardiopulmonary disease. Electronically Signed   By: Abigail Miyamoto M.D.   On: 10/04/2019 10:28   CT HEAD CODE STROKE WO CONTRAST  Result Date: 10/04/2019 CLINICAL DATA:  Code stroke.  Slurred speech. EXAM: CT HEAD WITHOUT CONTRAST TECHNIQUE: Contiguous axial images were obtained from the base of the skull through the vertex without intravenous contrast. COMPARISON:  07/14/2019 FINDINGS: Brain: A subdural hematoma laterally over the right cerebral convexity demonstrates increased density compared to the prior study though remains hypoattenuating relative to cortex and has not significantly changed in size with a maximal thickness of 9 mm. No definite acute/hyperdense blood products are identified. Leftward midline shift measures 3 mm, unchanged. Chronic right occipital, left frontal, left temporal lobe, and left cerebellar infarcts are unchanged. Hypodensities elsewhere in the cerebral white matter bilaterally and in the pons are nonspecific but compatible with mild chronic small vessel ischemic disease. There is mild cerebral atrophy. Vascular: Calcified atherosclerosis at the skull base. No hyperdense vessel. Skull: No fracture suspicious osseous lesion. Sinuses/Orbits: Unchanged left maxillary sinus mucous retention cyst. Clear mastoid air cells. Bilateral cataract extraction. Other: Unchanged 1.6 cm left suboccipital sebaceous cyst. ASPECTS ([place] Stroke Program Early CT Score) - Ganglionic level infarction (caudate, lentiform nuclei, internal capsule, insula, M1-M3 cortex): 7 - Supraganglionic infarction (M4-M6 cortex): 3 Total score (0-10 with 10 being normal): 10 IMPRESSION: 1. No acute  infarct identified.  ASPECTS of 10. 2. Chronic subdural hematoma over the right cerebral convexity, mildly increased in density compared to the prior CT though unchanged in size and without definite acute hemorrhage. Unchanged 3 mm of leftward midline shift. 3. Chronic bilateral cerebral and left cerebellar infarcts. These results were communicated to Dr. Rory Percy at 9:21 am on 10/04/2019 by text page via the [facility] messaging system. Electronically Signed   By: Logan Bores M.D.   On: 10/04/2019 09:22    ROS: All others negative except those listed in HPI.  Physical Exam: Vitals:   10/04/19 1100 10/04/19 1130 10/04/19 1200 10/04/19 1215  BP: (!) 148/69 (!) 149/77 (!) 157/52 (!) 153/122  Pulse: 84 81 83 85  Resp: 14  16   Temp:      TempSrc:      SpO2: 97% 97% 100% 97%  Weight:      Height:         General: WDWN, NAD, pleasant male, laying in bed Head: NCAT sclera not icteric  Neck: Supple. No lymphadenopathy Lungs: mostly CTA bilaterally. +rhonchi on L, No wheeze or rales. Breathing is unlabored. Heart: RRR. No murmur, rubs or gallops.  Abdomen: soft, nontender, +BS, no guarding, no rebound tenderness  Lower extremities:no edema, ischemic changes, or open wounds  Neuro: AAOx3. Moves all extremities spontaneously. Psych:  Responds to questions appropriately with a normal affect. Dialysis Access: RU AVF accessed  Dialysis Orders:  MWF - [facility]  4hrs, BFR 400, DFR 800,  DTO67, 2K/ 2Ca  Access: RU AVF  Heparin none Mircera 30 mcg q2wks - last 09/28/19 Venofer 24m qwk - last 09/28/19 Calcitriol 0.272m PO qHD  Assessment/Plan: 1.  AMS - per neuro likely metabolic encephalopathy related to uremia - missed HD yesterday.  Plan for HD today.  Neruo to reassess post HD.  2. Acute on chronic  SDH - worsened neurologic sx over last few days.  CT shows no acute changes. Per neuro. 3.  ESRD -  On HD MWF. Missed yesterday, plan for HD today and again tomorrow to stay on schedule.  If d/c can go to  outpatient dialysis tomorrow. K 4.9.  4.  Hypertension/volume  - Blood pressure elevated. Goal 140 in setting SDH. On Norvasc and Lopressor.  Does not appear volume overload, titrate down as tolerated.  5.  Anemia of CKD - Hgb 10.2. ESA recently dosed. Follow trends.  6.  Secondary Hyperparathyroidism -  Ca at goal. Will check phos.  Not on binders.  Continue VDRA. 7.  Nutrition - Renal diet w/fluid restrictions.  8. Multiple myeloma - followed by oncology 9. Recent aspiration PNA - on Cipro per primary 10. Seizure d/o - followed closely as OP.  Continue vimpat, lamictal 11. A fib - not on anticoag d/t Hx GIB 12. DMT2 13. Hx Proteus bacteremia - rechecking blood cultures  Jen Mow, PA-C [facility] Pager: [phone] 10/04/2019, 1:43 PM   Pt seen, examined and agree w A/P as above.  Kelly Splinter  MD 10/04/2019, 3:39 PM

## 2019-10-04 NOTE — ED Triage Notes (Signed)
Pt arrives via EMS from home with reports of LSN at 7. Wife noticed worsening slurred speech around 745. Pt nauseous and vomiting in CT, 4 mg zofran given. Pt missed HD yesterday due to weakness, n/v.

## 2019-10-04 NOTE — H&P (Signed)
History and Physical    Kyden Potash [facility] YBW:[id number] DOB: November 30, 1965 DOA: 10/04/2019  PCP: [name], MD Consultants:  Kappa - nephrology; [place] - oncology, Duke/[name] - oncology here; Delice Lesch - neurology; Posey Pronto - PM&R Patient coming from:  Home - lives with wife, son; Donald Prose: Wife, [phone]  Chief Complaint: Code Stroke  HPI: Eldred is a 54 y.o. male with medical history significant of CVA; OSA on CPAP; seizures; SDH (05/2019); PE on Coumadin; multiple myeloma; HTN; ESRD on MWF HD; DM; and afib presenting with Code Stroke.  He reports that he coughs when he gets, uh... His "wife thinks I cough a lot when I do this thing here."  Denies confusion.  Denies headache.  No dysphagia (failed swallow screen due to recurrent coughing).  No N/W/T of arms/legs.  No pain.  Last HD was "yesterday, which is Monday."  He didn't go because he was "coughing a lot and uh, I didn't, uh, because I didn't have anyone to ... I wouldn't go because my confusion."  He was admitted at [facility] from 9/17-29 for planned autologous stem cell transplant for multiple myeloma.  However, he had seizure activity following HD on 9/18, and head CT revealed an acute on chronic R-sided SDH.  He was also found to have Proteus mirabilis bacteremia (pan-sensitive), which was treated with Zosyn.  He was found to have B renal stones, but urology deferred removal in the setting of bacteremia.  He was transitioned to Cipro for continuous prophylactic therapy.  He was also noted to have aspiration PNA.  For his seizures, he was changed to Vimpat and Depabote with Klonopin after HD.  Given his multitude of medical problems, stem cell transplantation was held.  He was subsequently hospitalized at [facility] from 10/1-6 for acute on chronic SDH but did not require neurosurgical intervention.  He was discharged to [facility] and remained there until 10/22.  ED Course:  Seen by neurology for ?CVA.  Missed HD yesterday.  CTs unchanged from  baseline.  Has been vomiting.  Likely metabolic encephalopathy.  Dr. Jonnie Finner will arrange HD.  Review of Systems: As per HPI; otherwise review of systems reviewed and negative.  Difficult due to encephalopathy and inconsistent history.  Ambulatory Status:  Ambulates with a walker  Past Medical History:  Diagnosis Date  . A-fib ([place])   . Anemia   . Asthma   . DM type 2 (diabetes mellitus, type 2) ([place]) 06/09/2019  . ESRD (end stage renal disease) on dialysis ([place])   . Essential hypertension 06/09/2019  . GIB (gastrointestinal bleeding)    Recurrent episodes- 09/2014, 09/2015 and 05/2016  . Gout   . History of recent blood transfusion 10/27/14   2 Units PRBC's  . Hyperkalemia 07/2011  . Multiple myeloma ([place])   . OSA on CPAP   . Pulmonary embolism ([place]) 07/2011; 09/27/2014   a. Bilat PE 07/2011 - unclear cause, tx with 6 months Coumadin.;   . Seizure disorder ([place]) 06/09/2019  . Sepsis ([place])   . Sickle cell-thalassemia disease ([place])    a. Sickle cell trait.  . Sleep apnea   . Stroke ([place]) 09/2015   R-MCA, L-MCA, PCA and bilateral cerebellar complicated by DVT/PE  . Subdural hematoma ([place]) 05/2019    Past Surgical History:  Procedure Laterality Date  . AV FISTULA PLACEMENT Right 12/05/2015   Procedure: INSERTION OF ARTERIOVENOUS (AV) GORE-TEX GRAFT ARM;  Surgeon: Elam Dutch, MD;  Location: [place];  Service: Vascular;  Laterality: Right;  .  BONE MARROW BIOPSY    . CHOLECYSTECTOMY    . CHOLECYSTECTOMY  1990's?  . COLONOSCOPY WITH PROPOFOL N/A 01/22/2017   Procedure: COLONOSCOPY WITH PROPOFOL;  Surgeon: Wilford Corner, MD;  Location: [facility];  Service: Endoscopy;  Laterality: N/A;  . EYE SURGERY Right   . INSERTION OF DIALYSIS CATHETER Right 10/16/2015   Procedure: INSERTION OF PALINDROME DIALYSIS CATHETER ;  Surgeon: Elam Dutch, MD;  Location: [place];  Service: Vascular;  Laterality: Right;  . OTHER SURGICAL HISTORY     Retinal surgery  . PARS PLANA VITRECTOMY   02/17/2012   Procedure: PARS PLANA VITRECTOMY WITH 25 GAUGE;  Surgeon: Hayden Pedro, MD;  Location: [place];  Service: Ophthalmology;  Laterality: Right;  . PERIPHERAL VASCULAR CATHETERIZATION N/A 03/20/2016   Procedure: A/V Shuntogram/Fistulagram;  Surgeon: Conrad Echo, MD;  Location: [place] CV [facility];  Service: Cardiovascular;  Laterality: N/A;  . TEE WITHOUT CARDIOVERSION N/A 12/02/2016   Procedure: TRANSESOPHAGEAL ECHOCARDIOGRAM (TEE);  Surgeon: Larey Dresser, MD;  Location: [facility] ENDOSCOPY;  Service: Cardiovascular;  Laterality: N/A;    Social History   Socioeconomic History  . Marital status: Married    Spouse name: sylvia  . Number of children: 1  . Years of education: college  . Highest education level: Not on file  Occupational History  . Occupation: [employer]  . Occupation: disability  Tobacco Use  . Smoking status: Never Smoker  . Smokeless tobacco: Never Used  Substance and Sexual Activity  . Alcohol use: Not Currently  . Drug use: Never  . Sexual activity: Yes    Partners: Female  Other Topics Concern  . Not on file  Social History Narrative   ** Merged History Encounter **       Pt lives in 2 story home with his wife and 1 son Has masters degree in psychology Currently disabled.     Social Determinants of Health   Financial Resource Strain: Low Risk   . Difficulty of Paying Living Expenses: Not hard at all  Food Insecurity: No Food Insecurity  . Worried About Charity fundraiser in the Last Year: Never true  . Ran Out of Food in the Last Year: Never true  Transportation Needs: No Transportation Needs  . Lack of Transportation (Medical): No  . Lack of Transportation (Non-Medical): No  Physical Activity: Inactive  . Days of Exercise per Week: 0 days  . Minutes of Exercise per Session: 0 min  Stress: No Stress Concern Present  . Feeling of Stress : Not at all  Social Connections:   . Frequency of Communication with Friends and Family: Not  on file  . Frequency of Social Gatherings with Friends and Family: Not on file  . Attends Religious Services: Not on file  . Active Member of Clubs or Organizations: Not on file  . Attends Archivist Meetings: Not on file  . Marital Status: Not on file  Intimate Partner Violence: Not At Risk  . Fear of Current or Ex-Partner: No  . Emotionally Abused: No  . Physically Abused: No  . Sexually Abused: No    Allergies  Allergen Reactions  . Codeine Rash and Other (See Comments)    Unknown reaction (patient says it was more serious than just a rash, but he can't remember what happened) Unknown reaction (patient says it was more serious than just a rash, but he can't remember what happened)  . Codeine Swelling    Family History  Problem Relation Age of Onset  . Hypertension Mother   . Hypertension Father   . Stroke Father   . Sickle cell anemia Brother   . Diabetes Mother   . Sickle cell anemia Brother   . Diabetes type I Brother   . Kidney disease Brother     Prior to Admission medications   Medication Sig Start Date End Date Taking? Authorizing Provider  acetaminophen (TYLENOL) 325 MG tablet Take 1-2 tablets (325-650 mg total) by mouth every 4 (four) hours as needed for mild pain. 06/20/19  Yes Love, Ivan Anchors, PA-C  acyclovir (ZOVIRAX) 400 MG tablet Take 400 mg by mouth at bedtime.   Yes [provider]  albuterol (PROAIR HFA) 108 (90 Base) MCG/ACT inhaler Inhale two puffs every 4-6 hours if needed for cough or wheeze 04/08/16  Yes Kozlow, Donnamarie Poag, MD  camphor-menthol [facility]) lotion Apply 1 application topically every 8 (eight) hours as needed for itching. 06/30/19  Yes Love, Ivan Anchors, PA-C  Cetirizine HCl 10 MG CAPS Take 1 capsule by mouth daily.   Yes [provider]  clonazePAM (KLONOPIN) 0.5 MG tablet Take 2 tablets every Monday, Wednesday, and Friday with hemodialysis 08/08/19  Yes Cameron Sprang, MD  Daratumumab-Hyaluronidase-fihj (DARZALEX FASPRO  [place]) Inject 1 Dose into the skin once a week. On Tuesday   Yes [provider]  dexamethasone (DECADRON) 4 MG tablet Take 5 tablets (20 mg total) by mouth once a week. Take 5 tablets (20 mg total) by mouth once a week on day of chemo. 09/06/19  Yes Wyatt Portela, MD  fluticasone furoate-vilanterol (BREO ELLIPTA) 200-25 MCG/INH AEPB Inhale 1 puff into the lungs daily.   Yes [provider]  hydrOXYzine (ATARAX/VISTARIL) 25 MG tablet Take 1 tablet (25 mg total) by mouth every 8 (eight) hours as needed for itching. 06/30/19  Yes Love, Ivan Anchors, PA-C  Insulin Lispro (HUMALOG KWIKPEN [place]) Inject 2-4 Units into the skin as needed (to control Glucose). Sliding scale: 175-200 =2 units, 201-250=3 units,-251 += 4 units   Yes [provider]  ketorolac (ACULAR) 0.5 % ophthalmic solution Place 1 drop into both eyes 4 (four) times daily. 09/22/19  Yes [provider]  lacosamide (VIMPAT) 200 MG TABS tablet Take 1 tablet twice a day except on dialysis days M-W-F give 1 tab in AM, 1 tab after dialysis, 1 tab in PM. 07/12/19  Yes Cameron Sprang, MD  lamoTRIgine (LAMICTAL) 150 MG tablet Take 1 tablet (150 mg total) by mouth 2 (two) times daily. 07/12/19  Yes Cameron Sprang, MD  metoprolol tartrate (LOPRESSOR) 25 MG tablet Take 12.5 mg by mouth 2 (two) times daily.   Yes [provider]  multivitamin (RENA-VIT) TABS tablet Take 1 tablet by mouth at bedtime.   Yes [provider]  ofloxacin (OCUFLOX) 0.3 % ophthalmic solution Place 1 drop into the left eye 4 (four) times daily. 09/15/19  Yes [provider]  pantoprazole (PROTONIX) 40 MG tablet Take 40 mg by mouth daily.   Yes [provider]  prednisoLONE acetate (PRED FORTE) 1 % ophthalmic suspension Place 1 drop into the left eye 4 (four) times daily. 09/15/19  Yes [provider]  prochlorperazine (COMPAZINE) 10 MG tablet TAKE 1 TABLET (10 MG TOTAL) BY MOUTH EVERY 6 (SIX) HOURS AS NEEDED FOR  NAUSEA OR VOMITING. 11/15/18  Yes [name], Mathis Dad, MD  amLODipine (NORVASC) 2.5 MG tablet Take 1 tablet (2.5 mg total) by mouth at bedtime. Patient taking  differently: Take 2.5 mg by mouth at bedtime. ON HOLD PER WIFE 06/30/19   Love, Ivan Anchors, PA-C  calcium carbonate (TUMS - DOSED IN MG ELEMENTAL CALCIUM) 500 MG chewable tablet Chew 2 tablets by mouth at bedtime.    [provider]  cinacalcet (SENSIPAR) 60 MG tablet Take 60 mg by mouth every evening. ON HOLD FOR DIALYSIS PER WIFE    [provider]    Physical Exam: Vitals:   10/04/19 1700 10/04/19 1730 10/04/19 1800 10/04/19 1821  BP: (!) 97/51 (!) 93/44 (!) 82/37 (!) 97/48  Pulse: 81 81 86 86  Resp: (!) _0 Temp:    97.6 F (36.4 C)  TempSrc:    Oral  SpO2:    99%  Weight:      Height:         . General:  Appears calm and comfortable and is NAD; he is confused and has difficulty answering most questions . Eyes:  PERRL, EOMI, normal lids, mild L eye irritation . ENT:  grossly normal hearing, lips & tongue, mmm; appropriate dentition . Neck:  no LAD, masses or thyromegaly . Cardiovascular:  RRR, no m/r/g. No LE edema.  [name] Respiratory:   CTA bilaterally with no wheezes/rales/rhonchi.  Normal respiratory effort. . Abdomen:  soft, NT, ND, NABS . Skin:  no rash or induration seen on limited exam . Musculoskeletal:  Primarily right-sided weakness but encephalopathy limits exam, no bony abnormality . Psychiatric:  Somewhat confused mood and affect, speech slow and mostly discombobulated, AOx3 . Neurologic:  Unable to effectively perform    Radiological Exams on Admission: CT Code Stroke CTA Head W/WO contrast  Result Date: 10/04/2019 CLINICAL DATA:  Code stroke, follow-up EXAM: CT ANGIOGRAPHY HEAD AND NECK CT PERFUSION BRAIN TECHNIQUE: Multidetector CT imaging of the head and neck was performed using the standard protocol during bolus administration of intravenous contrast. Multiplanar CT image  reconstructions and MIPs were obtained to evaluate the vascular anatomy. Carotid stenosis measurements (when applicable) are obtained utilizing NASCET criteria, using the distal internal carotid diameter as the denominator. Multiphase CT imaging of the brain was performed following IV bolus contrast injection. Subsequent parametric perfusion maps were calculated using RAPID software. CONTRAST:  135m OMNIPAQUE IOHEXOL 350 MG/ML SOLN COMPARISON:  None. FINDINGS: CTA NECK FINDINGS Aortic arch: Great vessel origins are patent. Right carotid system: Patent. Minimal calcified plaque at the ICA origin without measurable stenosis. Left carotid system: Patent. Mild calcified and noncalcified plaque at the ICA origin causing minimal stenosis. Vertebral arteries: Patent and codominant. No stenosis or evidence of dissection. Skeleton: Mild degenerative changes of the cervical spine. Other neck: A lipoma is present in the right neck mildly displacing the submandibular gland. No adenopathy. Upper chest: No apical lung mass. Review of the MIP images confirms the above findings CTA HEAD FINDINGS Anterior circulation: Intracranial internal carotid arteries patent with mild calcified plaque. Anterior and middle cerebral arteries are patent. Left A1 ACA is dominant. Posterior circulation: Intracranial vertebral arteries, basilar artery, and posterior cerebral arteries are patent. A left posterior communicating artery is present. Venous sinuses: As permitted by contrast timing, patent. Review of the MIP images confirms the above findings CT Brain Perfusion Findings: CBF (<30%) Volume: 043m There is expected decreased CBF and CBV corresponding to the chronic infarcts on CT. Perfusion (Tmax>6.0s) volume: 68m67mismatch Volume: 68mL51mfarction Location: None IMPRESSION: No large vessel occlusion or hemodynamically significant stenosis. No evidence of territory at risk by perfusion imaging. Electronically Signed  By: Macy Mis M.D.    On: 10/04/2019 09:45   CT Code Stroke CTA Neck W/WO contrast  Result Date: 10/04/2019 CLINICAL DATA:  Code stroke, follow-up EXAM: CT ANGIOGRAPHY HEAD AND NECK CT PERFUSION BRAIN TECHNIQUE: Multidetector CT imaging of the head and neck was performed using the standard protocol during bolus administration of intravenous contrast. Multiplanar CT image reconstructions and MIPs were obtained to evaluate the vascular anatomy. Carotid stenosis measurements (when applicable) are obtained utilizing NASCET criteria, using the distal internal carotid diameter as the denominator. Multiphase CT imaging of the brain was performed following IV bolus contrast injection. Subsequent parametric perfusion maps were calculated using RAPID software. CONTRAST:  191m OMNIPAQUE IOHEXOL 350 MG/ML SOLN COMPARISON:  None. FINDINGS: CTA NECK FINDINGS Aortic arch: Great vessel origins are patent. Right carotid system: Patent. Minimal calcified plaque at the ICA origin without measurable stenosis. Left carotid system: Patent. Mild calcified and noncalcified plaque at the ICA origin causing minimal stenosis. Vertebral arteries: Patent and codominant. No stenosis or evidence of dissection. Skeleton: Mild degenerative changes of the cervical spine. Other neck: A lipoma is present in the right neck mildly displacing the submandibular gland. No adenopathy. Upper chest: No apical lung mass. Review of the MIP images confirms the above findings CTA HEAD FINDINGS Anterior circulation: Intracranial internal carotid arteries patent with mild calcified plaque. Anterior and middle cerebral arteries are patent. Left A1 ACA is dominant. Posterior circulation: Intracranial vertebral arteries, basilar artery, and posterior cerebral arteries are patent. A left posterior communicating artery is present. Venous sinuses: As permitted by contrast timing, patent. Review of the MIP images confirms the above findings CT Brain Perfusion Findings: CBF (<30%) Volume:  011m There is expected decreased CBF and CBV corresponding to the chronic infarcts on CT. Perfusion (Tmax>6.0s) volume: 29m74mismatch Volume: 29mL4mfarction Location: None IMPRESSION: No large vessel occlusion or hemodynamically significant stenosis. No evidence of territory at risk by perfusion imaging. Electronically Signed   By: PranMacy Mis.   On: 10/04/2019 09:45   CT Code Stroke Cerebral Perfusion with contrast  Result Date: 10/04/2019 CLINICAL DATA:  Code stroke, follow-up EXAM: CT ANGIOGRAPHY HEAD AND NECK CT PERFUSION BRAIN TECHNIQUE: Multidetector CT imaging of the head and neck was performed using the standard protocol during bolus administration of intravenous contrast. Multiplanar CT image reconstructions and MIPs were obtained to evaluate the vascular anatomy. Carotid stenosis measurements (when applicable) are obtained utilizing NASCET criteria, using the distal internal carotid diameter as the denominator. Multiphase CT imaging of the brain was performed following IV bolus contrast injection. Subsequent parametric perfusion maps were calculated using RAPID software. CONTRAST:  1029mL37mIPAQUE IOHEXOL 350 MG/ML SOLN COMPARISON:  None. FINDINGS: CTA NECK FINDINGS Aortic arch: Great vessel origins are patent. Right carotid system: Patent. Minimal calcified plaque at the ICA origin without measurable stenosis. Left carotid system: Patent. Mild calcified and noncalcified plaque at the ICA origin causing minimal stenosis. Vertebral arteries: Patent and codominant. No stenosis or evidence of dissection. Skeleton: Mild degenerative changes of the cervical spine. Other neck: A lipoma is present in the right neck mildly displacing the submandibular gland. No adenopathy. Upper chest: No apical lung mass. Review of the MIP images confirms the above findings CTA HEAD FINDINGS Anterior circulation: Intracranial internal carotid arteries patent with mild calcified plaque. Anterior and middle cerebral  arteries are patent. Left A1 ACA is dominant. Posterior circulation: Intracranial vertebral arteries, basilar artery, and posterior cerebral arteries are patent. A left posterior communicating artery is present. Venous sinuses:  As permitted by contrast timing, patent. Review of the MIP images confirms the above findings CT Brain Perfusion Findings: CBF (<30%) Volume: 2m. There is expected decreased CBF and CBV corresponding to the chronic infarcts on CT. Perfusion (Tmax>6.0s) volume: 077mMismatch Volume: 18m67mnfarction Location: None IMPRESSION: No large vessel occlusion or hemodynamically significant stenosis. No evidence of territory at risk by perfusion imaging. Electronically Signed   By: PraMacy MisD.   On: 10/04/2019 09:45   DG Chest Portable 1 View  Result Date: 10/04/2019 CLINICAL DATA:  Weakness. EXAM: PORTABLE CHEST 1 VIEW COMPARISON:  06/09/2019 FINDINGS: Numerous leads and wires project over the chest. Patient rotated left. Midline trachea. Mild cardiomegaly. No pleural effusion or pneumothorax. Clear lungs. IMPRESSION: No acute cardiopulmonary disease. Electronically Signed   By: KylAbigail MiyamotoD.   On: 10/04/2019 10:28   CT HEAD CODE STROKE WO CONTRAST  Result Date: 10/04/2019 CLINICAL DATA:  Code stroke.  Slurred speech. EXAM: CT HEAD WITHOUT CONTRAST TECHNIQUE: Contiguous axial images were obtained from the base of the skull through the vertex without intravenous contrast. COMPARISON:  07/14/2019 FINDINGS: Brain: A subdural hematoma laterally over the right cerebral convexity demonstrates increased density compared to the prior study though remains hypoattenuating relative to cortex and has not significantly changed in size with a maximal thickness of 9 mm. No definite acute/hyperdense blood products are identified. Leftward midline shift measures 3 mm, unchanged. Chronic right occipital, left frontal, left temporal lobe, and left cerebellar infarcts are unchanged. Hypodensities  elsewhere in the cerebral white matter bilaterally and in the pons are nonspecific but compatible with mild chronic small vessel ischemic disease. There is mild cerebral atrophy. Vascular: Calcified atherosclerosis at the skull base. No hyperdense vessel. Skull: No fracture suspicious osseous lesion. Sinuses/Orbits: Unchanged left maxillary sinus mucous retention cyst. Clear mastoid air cells. Bilateral cataract extraction. Other: Unchanged 1.6 cm left suboccipital sebaceous cyst. ASPECTS ([place]roke Program Early CT Score) - Ganglionic level infarction (caudate, lentiform nuclei, internal capsule, insula, M1-M3 cortex): 7 - Supraganglionic infarction (M4-M6 cortex): 3 Total score (0-10 with 10 being normal): 10 IMPRESSION: 1. No acute infarct identified.  ASPECTS of 10. 2. Chronic subdural hematoma over the right cerebral convexity, mildly increased in density compared to the prior CT though unchanged in size and without definite acute hemorrhage. Unchanged 3 mm of leftward midline shift. 3. Chronic bilateral cerebral and left cerebellar infarcts. These results were communicated to Dr. AroRory Percy 9:21 am on 10/04/2019 by text page via the [facility] system. Electronically Signed   By: AllLogan BoresD.   On: 10/04/2019 09:22    EKG: Independently reviewed.  NSR with rate 83; prolonged QTc 540; nonspecific ST changes with no evidence of acute ischemia; NSCSLT   Labs on Admission: I have personally reviewed the available labs and imaging studies at the time of the admission.  Pertinent labs:   Glucose 168 BUN 60/Creatinine 14.87/GFR 3 Anion gap 17 WBC 7.2 Hgb 10.2 INR 1.0 A1c 5.1 on 1/14 Respiratory panel PCR negative   Assessment/Plan Principal Problem:   Acute metabolic encephalopathy Active Problems:   ESRD (end stage renal disease) on dialysis (HCC)   Benign essential HTN   Controlled type 2 diabetes mellitus with diabetic nephropathy, without long-term current use of insulin  (HCC)   Hemiparesis affecting right side as late effect of cerebrovascular accident ([place] Partial idiopathic epilepsy with seizures of localized onset, intractable, without status epilepticus ([place]CC[place] Chronic intracranial subdural hematoma ([place]  Multiple myeloma not having achieved remission (HCC)   Atrial fibrillation, chronic (HCC)   COPD (chronic obstructive pulmonary disease) (HCC)   AMS - Metabolic encepahlopathy from missed HD, likely cause -Patient presenting with encephalopathy as evidenced by his confusion, difficulty answering questions -Initial concern was for CVA but further evaluation makes it appears that this is uremia causing a metabolic encephalopathy - likely associated with missing HD yesterday  -Based on unremarkable evaluation and anticipation that patient will clear his sensorium with HD, will observe for now with telemetry monitoring -Patient on chronic MWF HD -Nephrology prn order set utilized -Nephrology is consulting and has ordered HD -Continue renavite -Continue Klonopin -Wife reports that Sensipar is on hold  Chronic SDH -Patient with prior admission for acute on chronic R-sided SDH -Given his encephalopathy on presentation, there was concern that this could be related to acute CVA -Neurology evaluated and instead thought this was more likely related to uremia -Neurology may consider further CVA evaluation if mental status does not clear with HD -Continue Acyclovir -Has impaired mobility at baseline  Multiple myeloma -Patient had been previously followed for MGUS but bone marrow biopsy in 2018 showed almost 20% plasma cell infiltration -Negative skeletal survey in 2/19 -Treatment was deferred in 4/19 due to lack of symptoms -8/19 bone marrow biopsy with 25% abnormal plasma cells, FISH with IgG kappa -Skeletal survey remained negative -He was recently admitted to [name] for autologous stem cell transplant, but this was delayed due to other medical  problems that occurred while hospitalized -He is currently receiving weekly treatment with Darzalex Faspro with Decadron weekly; will hold for right now given acute presentation -He may be a candidate for more aggressive treatment in the future  Seizure d/o -Started after patient's CVA -Continue Vimpat, Lamictal  Afib -Reported h/o afib and PFO -IVC filter is in place -He is not a candidate for [facility] -Rate control with metoprolol  DM -He appears to be only on SSI -Recent A1c was 5.1 -He does not appear to need treatment for this at this time  HTN -Continue Lopressor -Norvasc is on hold, as per wife  COPD -Continue Breo Ellipta -Continue Albuterol HFA prn  L eye infection -Continue Acular, ofloxacin drops, and Pred Forte   Note: This patient has been tested and is negative for the novel coronavirus COVID-19.  DVT prophylaxis: Heparin Code Status:  Full - confirmed with prior admission Family Communication: None present Disposition Plan:  Home once clinically improved Consults called: Nephrology; neurology  Admission status: It is my clinical opinion that referral for OBSERVATION is reasonable and necessary in this patient based on the above information provided. The aforementioned taken together are felt to place the patient at high risk for further clinical deterioration. However it is anticipated that the patient may be medically stable for discharge from the hospital within 24 to 48 hours.    Karmen Bongo MD [facility]   How to contact the [facility] Attending or Consulting provider [place] or covering provider during after hours [place], for this patient?  1. Check the care team in [facility] and look for a) attending/consulting TR[facility] provider listed and b) the [facility] team listed 2. Log into [email or web address] and use [place]'s universal password to access. If you do not have the password, please contact the hospital operator. 3. Locate the [name] provider you are looking for  under [facility] and page to a number that you can be directly reached. 4. If you still have difficulty reaching the  provider, please page the [facility] (Director on Call) for the Hospitalists listed on amion for assistance.   10/04/2019, 6:47 PM

## 2019-10-04 NOTE — Consult Note (Signed)
Neurology Consultation  Reason for Consult: Code stroke Referring Physician: Verner Chol  CC: Increased dysarthria, right facial droop  History is obtained from: Wife/chart/EMS  HPI: [place] is a 54 y.o. male with history of diabetes, sickle cell trait, Afib, DVT and PE off Coumadin due to GI bleed, sleep apnea, chronic kidney disease on dialysis, strokes in multiple vascular distributions with some residual right hemiparesis, chronic subdural which was followed by seizures, multiple myeloma,who was brought to the hospital today due to worsening of dysarthria, right-sided facial droop and acting abnormal from baseline.  Code stroke was called and neurology saw patient in the ED.  Patient is followed closely by Dr. Delice Lesch for seizures, likely secondary to previous subdural hematoma/strokes.  Most recent note states that patient's Vimpat was increased to 200 mg twice daily with additional dose of 200 mg after hemodialysis.  He is also on Lamictal 150 mg twice daily and clonazepam 1 mg prior to hemodialysis.  On Friday status post hemodialysis, wife noted that patient had increased slurred speech.  Dr. Delice Lesch recommended he go to ED to have CT of head, however patient refused.  Due to nausea vomiting yesterday on 10/03/2019 patient was not able to get his dialysis secondary to nausea vomiting.  Patient was also noted to be very weak throughout.  This morning he also was nauseous and vomited.  However, he was able to get all of his antiepileptic medications down and also took the dexamethasone,  which he takes for his MM.  Due to the above and also patient not acting normal, EMS was called.  EMS brought patient as code stroke because of timing of LKW told to them to be somewhere this morning.  At the bridge patient was alert and oriented, he was very nauseous, noted right facial droop and dysarthria.  Of note: Patient's wife also notes that back in September he went to [facility] for a bone marrow and stem  cell transplant.  However at that time he was not able to obtain this because he was found to have Proteus bacteremia, treated with Cipro which he just finished the course recently.  ED course  Relevant labs include -glucose 168, BUN 60, creatinine 14.87 which is much elevated from 7 days ago of 8.81, GFR 4 CT head shows-no acute changes/blood  Chart review-as noted above patient is followed closely by Dr. Delice Lesch.  Most recently on 09/13/2019 wife called the office stating that before leaving to take him dialysis patient had episode that she was not sure if seizure activity.  At that time patient had a 48-hour EEG to capture episodes.  There were no electrographic seizures seen. Of note, there was episodes of confusion captured while the EEG was hooked up but they had no electrographic correlate.  LKW: 09/30/2019 tpa given?: no, subdural hematoma Premorbid modified Rankin scale (mRS): 3 NIH stroke score of 7  Past Medical History:  Diagnosis Date  . A-fib ([place])   . Anemia   . Asthma   . Diabetes mellitus without complication ([place])   . DM type 2 (diabetes mellitus, type 2) ([place]) 06/09/2019  . ESRD (end stage renal disease) on dialysis ([place])   . Essential hypertension 06/09/2019  . GIB (gastrointestinal bleeding)    Recurrent episodes- 09/2014, 09/2015 and 05/2016  . Gout   . History of recent blood transfusion 10/27/14   2 Units PRBC's  . Hyperkalemia 07/2011  . Hypertension   . Monoclonal gammopathy   . Multiple myeloma ([place])   .  OSA on CPAP   . Pulmonary emboli ([place]) 07/2011   bilateral--treated with comadin X 6 months  . Pulmonary embolism ([place]) 07/2011; 09/27/2014   a. Bilat PE 07/2011 - unclear cause, tx with 6 months Coumadin.;   . Seizure disorder ([place]) 06/09/2019  . Seizures ([place])   . Sepsis ([place])   . Sickle cell-thalassemia disease ([place])    a. Sickle cell trait.  . Sleep apnea   . Stroke ([place])   . Stroke ([place]) 09/2015   R-MCA, L-MCA, PCA and bilateral cerebellar  complicated by DVT/PE  . Subdural hematoma ([place]) 05/2019     Family History  Problem Relation Age of Onset  . Hypertension Mother   . Hypertension Father   . Stroke Father   . Sickle cell anemia Brother   . Diabetes Mother   . Sickle cell anemia Brother   . Diabetes type I Brother   . Kidney disease Brother     Social History:   reports that he has never smoked. He has never used smokeless tobacco. He reports previous alcohol use. He reports that he does not use drugs.  Medications  Current Facility-Administered Medications:  .  sodium chloride flush (NS) 0.9 % injection 3 mL, 3 mL, Intravenous, Once, Sherwood Gambler, MD  Current Outpatient Medications:  .  acetaminophen (TYLENOL) 325 MG tablet, Take 1-2 tablets (325-650 mg total) by mouth every 4 (four) hours as needed for mild pain., Disp:  , Rfl:  .  acyclovir (ZOVIRAX) 400 MG tablet, Take 400 mg by mouth at bedtime., Disp: , Rfl:  .  albuterol (PROAIR HFA) 108 (90 Base) MCG/ACT inhaler, Inhale two puffs every 4-6 hours if needed for cough or wheeze, Disp: 1 Inhaler, Rfl: 1 .  amLODipine (NORVASC) 2.5 MG tablet, Take 1 tablet (2.5 mg total) by mouth at bedtime. (Patient taking differently: Take 2.5 mg by mouth at bedtime. ON HOLD PER WIFE), Disp: 30 tablet, Rfl: 0 .  calcium carbonate (TUMS - DOSED IN MG ELEMENTAL CALCIUM) 500 MG chewable tablet, Chew 2 tablets by mouth at bedtime., Disp: , Rfl:  .  camphor-menthol (SARNA) lotion, Apply 1 application topically every 8 (eight) hours as needed for itching., Disp: 222 mL, Rfl: 0 .  Cetirizine HCl 10 MG CAPS, Take 1 capsule by mouth daily., Disp: , Rfl:  .  cinacalcet (SENSIPAR) 60 MG tablet, Take 60 mg by mouth every evening. ON HOLD FOR DIALYSIS PER WIFE, Disp: , Rfl:  .  ciprofloxacin (CIPRO) 500 MG tablet, Take 500 mg by mouth every evening. For 120 days, Disp: , Rfl:  .  clonazePAM (KLONOPIN) 0.5 MG tablet, Take 2 tablets every Monday, Wednesday, and Friday with hemodialysis,  Disp: 30 tablet, Rfl: 5 .  dexamethasone (DECADRON) 4 MG tablet, Take 5 tablets (20 mg total) by mouth once a week. Take 5 tablets (20 mg total) by mouth once a week on day of chemo., Disp: 40 tablet, Rfl: 3 .  fluticasone furoate-vilanterol (BREO ELLIPTA) 200-25 MCG/INH AEPB, Inhale 1 puff into the lungs daily., Disp: , Rfl:  .  hydrOXYzine (ATARAX/VISTARIL) 25 MG tablet, Take 1 tablet (25 mg total) by mouth every 8 (eight) hours as needed for itching., Disp: 30 tablet, Rfl: 0 .  lacosamide (VIMPAT) 200 MG TABS tablet, Take 1 tablet twice a day except on dialysis days M-W-F give 1 tab in AM, 1 tab after dialysis, 1 tab in PM., Disp: 90 tablet, Rfl: 5 .  lamoTRIgine (LAMICTAL) 150 MG tablet, Take 1 tablet (150  mg total) by mouth 2 (two) times daily., Disp: 180 tablet, Rfl: 3 .  metoprolol tartrate (LOPRESSOR) 25 MG tablet, Take 12.5 mg by mouth 2 (two) times daily., Disp: , Rfl:  .  multivitamin (RENA-VIT) TABS tablet, Take 1 tablet by mouth at bedtime., Disp: , Rfl:  .  pantoprazole (PROTONIX) 40 MG tablet, Take 40 mg by mouth daily., Disp: , Rfl:  .  prochlorperazine (COMPAZINE) 10 MG tablet, TAKE 1 TABLET (10 MG TOTAL) BY MOUTH EVERY 6 (SIX) HOURS AS NEEDED FOR NAUSEA OR VOMITING., Disp: 30 tablet, Rfl: 0  ROS:  General ROS: negative for - chills, fatigue, fever, night sweats, weight gain or weight loss Ophthalmic ROS: negative for - blurry vision, double vision, eye pain or loss of vision ENT ROS: negative for - epistaxis, nasal discharge, oral lesions, sore throat, tinnitus or vertigo Respiratory ROS: negative for - cough, hemoptysis, shortness of breath or wheezing Cardiovascular ROS: negative for - chest pain, dyspnea on exertion, edema or irregular heartbeat Gastrointestinal ROS: Positive for -  nausea/vomiting  Genito-Urinary ROS: negative for - dysuria, hematuria, incontinence or urinary frequency/urgency Musculoskeletal ROS: negative for - joint swelling or muscular  weakness Neurological ROS: as noted in HPI Dermatological ROS: negative for rash and skin lesion changes  Exam: Current vital signs: BP 129/71 (BP Location: Left Arm)   Pulse 85   Temp 97.6 F (36.4 C) (Oral)   Resp 16   Ht _0  (1.778 m)   Wt 84.3 kg   SpO2 92%   BMI 26.67 kg/m  Vital signs in last 24 hours: Temp:  [97.6 F (36.4 C)] 97.6 F (36.4 C) (01/26 0929) Pulse Rate:  [85] 85 (01/26 0929) Resp:  [16] 16 (01/26 0929) BP: (129)/(71) 129/71 (01/26 0929) SpO2:  [92 %] 92 % (01/26 0929) Weight:  [84.3 kg] 84.3 kg (01/26 0929)   Constitutional: Appears well-developed and well-nourished.  Eyes: No scleral injection HENT: No OP obstrucion Head: Normocephalic.  Cardiovascular: Normal rate and regular rhythm.  Respiratory: Effort normal, non-labored breathing GI: Soft.  No distension.  Positive nausea and vomiting Skin: WDI  Neuro: Mental Status: Patient is awake, alert, oriented to person, Speech-showed some mild aphasia and dysarthria.   Patient is unable to give a clear history Cranial Nerves: II: question subtle left heminapsia.  III,IV, VI: EOMI without ptosis or diploplia. Pupils equal, round and reactive to light V: Facial sensation is symmetric to temperature VII: Right facial droop.  VIII: hearing is intact to voice X: Palat elevates symmetrically XI: Shoulder shrug is symmetric. XII: tongue is midline without atrophy or fasciculations.  Motor: Tone is normal.  4+/5 RUE, 4+/5 RLE. Left 5/5 Aterixis in upper extremities with right greater than left Sensory: Sensation partial loss on the right arm and leg DSS intact Deep Tendon Reflexes: 2+ and symmetric in the biceps and patellae.  Plantars: Toes are downgoing bilaterally.  Cerebellar: FNF on the right with mild ataxia  Labs I have reviewed labs in epic and the results pertinent to this consultation are:  CBC    Component Value Date/Time   WBC 7.2 10/04/2019 0900   RBC 3.03 (L)  10/04/2019 0900   HGB 10.2 (L) 10/04/2019 0915   HGB 10.8 (L) 09/27/2019 1016   HGB 10.9 (L) 07/03/2017 0813   HCT 30.0 (L) 10/04/2019 0915   HCT 30.4 (L) 07/03/2017 0813   PLT 265 10/04/2019 0900   PLT 219 09/27/2019 1016   PLT 355 07/03/2017 0813   MCV 88.4 10/04/2019  0900   MCV 85.9 07/03/2017 0813   MCH 34.7 (H) 10/04/2019 0900   MCHC 39.2 (H) 10/04/2019 0900   RDW 15.4 10/04/2019 0900   RDW 17.6 (H) 07/03/2017 0813   LYMPHSABS 0.0 (L) 10/04/2019 0900   LYMPHSABS 2.3 07/03/2017 0813   MONOABS 0.9 10/04/2019 0900   MONOABS 1.2 (H) 07/03/2017 0813   EOSABS 0.4 10/04/2019 0900   EOSABS 0.3 07/03/2017 0813   BASOSABS 0.1 10/04/2019 0900   BASOSABS 0.1 07/03/2017 0813    CMP     Component Value Date/Time   NA 134 (L) 10/04/2019 0915   NA 138 07/03/2017 0813   K 4.9 10/04/2019 0915   K 4.2 07/03/2017 0813   CL 95 (L) 10/04/2019 0915   CO2 31 09/27/2019 1016   CO2 27 07/03/2017 0813   GLUCOSE 159 (H) 10/04/2019 0915   GLUCOSE 76 07/03/2017 0813   BUN 71 (H) 10/04/2019 0915   BUN 44 (A) 08/17/2019 0000   BUN 46.4 (H) 07/03/2017 0813   CREATININE 15.30 (H) 10/04/2019 0915   CREATININE 8.81 (HH) 09/27/2019 1016   CREATININE 10.9 (HH) 07/03/2017 0813   CALCIUM 8.9 09/27/2019 1016   CALCIUM 9.3 07/03/2017 0813   PROT 6.5 09/27/2019 1016   PROT 10.6 (H) 07/03/2017 0813   PROT 9.6 (H) 07/03/2017 0813   ALBUMIN 3.9 09/27/2019 1016   ALBUMIN 4.0 07/03/2017 0813   AST 13 (L) 09/27/2019 1016   AST 16 07/03/2017 0813   ALT 7 09/27/2019 1016   ALT 14 07/03/2017 0813   ALKPHOS 86 09/27/2019 1016   ALKPHOS 77 07/03/2017 0813   BILITOT 0.9 09/27/2019 1016   BILITOT 0.93 07/03/2017 0813   GFRNONAA 6 (L) 09/27/2019 1016   GFRAA 7 (L) 09/27/2019 1016   Imaging I have reviewed the images obtained: CT-scan of the brain-no acute infarct identified.  Chronic subdural hematoma over the right cerebral convexity, mildly increased in density compared to prior CT though unchanged in  size without definite acute hemorrhage.  Unchanged 3 mm of leftward midline shift.  Chronic bilateral cerebral and left cerebellar infarcts. CTA of head neck/perfion-no large vessel occlusion or hemodynamically significant stenosis.  CT perfusion showed no evidence of territory at risk by perfusion images  Etta Quill PA-C [facility] [phone]  M-F  (9:00 am- 5:00 PM)  10/04/2019, 9:44 AM     Attending addendum I have seen and examined the patient independently -brought in as an acute code stroke for right-sided weakness facial droop and slurred speech.  Spoke with the wife in person in the hospital consultation room. According to chart review and history from the wife, symptoms have been ongoing for a few days. Not a candidate for IV TPA due to being outside the window History of chronic subdural that is stable based on today's imaging that I reviewed personally.  No new bleed.  No LVO on vessel imaging. On examination, has prominent asterixis and mild right hemiparesis along with significant decrease in attention and concentration. He has presented in the past with similar presentations in the setting of toxic metabolic derangements. Suspect this to be a toxic metabolic phenomenon causing recrudescence of old symptoms.  Assessment: 54 year old male with significant history of left MCA infarct, right subdural and seizure disorder.  Patient arrives today secondary to increasing right facial droop, increased dysarthria and mental status changes.  CT head did not show clinical changes or abnormalities abnormalities to explain these changes.  CTA of head and neck did not show any large  vessel occlusion along with CT perfusion showing no territory at risk by perfusion imaging.  On exam patient does show right facial droop, mild dysarthria and aphasia, however patient is alert and oriented and able to follow commands.  He does have decreased attention concentration.  He has had  similar episodes in the past in the setting of toxic metabolic derangements. Given that the patient missed his hemodialysis, exam showing asterixis, with acute on chronic renal derangement, findings are likely recrudesence of previous stroke symptoms secondary to metabolic issues.  Although patient is afebrile with no elevated CBC will also obtain blood cultures to be evaluate for possible sepsis, given history of multiple myeloma immunosuppression as well as recent history of Proteus bacteremia.  Impression: -Multifactorial toxic metabolic encephalopathy  Recommendations: -Hemodialysis per primary team -Correct underlying metabolic abnormalities -Obtain blood cultures to evaluate for any evidence of bacteremia -Do not believe he would benefit from MRI at this time  We will follow with you  -- Amie [place], MD [facility] Pager: [phone] If 7pm to 7am, please call on call as listed on AMION.   CRITICAL CARE ATTESTATION Performed by: Amie [place], MD Total critical care time: 50 minutes Critical care time was exclusive of separately billable procedures and treating other patients and/or supervising APPs/Residents/Students Critical care was necessary to treat or prevent imminent or life-threatening deterioration due to multifactorial toxic metabolic encephalopathy This patient is critically ill and at significant risk for neurological worsening and/or death and care requires constant monitoring. Critical care was time spent personally by me on the following activities: development of treatment plan with patient and/or surrogate as well as nursing, discussions with consultants, evaluation of patient's response to treatment, examination of patient, obtaining history from patient or surrogate, ordering and performing treatments and interventions, ordering and review of laboratory studies, ordering and review of radiographic studies, pulse oximetry, re-evaluation of patient's  condition, participation in multidisciplinary rounds and medical decision making of high complexity in the care of this patient.

## 2019-10-05 ENCOUNTER — Observation Stay (HOSPITAL_COMMUNITY): Payer: Medicare Other

## 2019-10-05 ENCOUNTER — Telehealth: Payer: Self-pay | Admitting: Neurology

## 2019-10-05 ENCOUNTER — Telehealth: Payer: Self-pay | Admitting: Physical Medicine & Rehabilitation

## 2019-10-05 ENCOUNTER — Telehealth: Payer: Self-pay

## 2019-10-05 DIAGNOSIS — E1140 Type 2 diabetes mellitus with diabetic neuropathy, unspecified: Secondary | ICD-10-CM | POA: Diagnosis not present

## 2019-10-05 DIAGNOSIS — I69320 Aphasia following cerebral infarction: Secondary | ICD-10-CM | POA: Diagnosis not present

## 2019-10-05 DIAGNOSIS — Z992 Dependence on renal dialysis: Secondary | ICD-10-CM

## 2019-10-05 DIAGNOSIS — I6201 Nontraumatic acute subdural hemorrhage: Secondary | ICD-10-CM | POA: Diagnosis present

## 2019-10-05 DIAGNOSIS — C9000 Multiple myeloma not having achieved remission: Secondary | ICD-10-CM | POA: Diagnosis present

## 2019-10-05 DIAGNOSIS — N186 End stage renal disease: Secondary | ICD-10-CM

## 2019-10-05 DIAGNOSIS — M109 Gout, unspecified: Secondary | ICD-10-CM | POA: Diagnosis present

## 2019-10-05 DIAGNOSIS — Z79899 Other long term (current) drug therapy: Secondary | ICD-10-CM | POA: Diagnosis not present

## 2019-10-05 DIAGNOSIS — I480 Paroxysmal atrial fibrillation: Secondary | ICD-10-CM | POA: Diagnosis present

## 2019-10-05 DIAGNOSIS — D509 Iron deficiency anemia, unspecified: Secondary | ICD-10-CM | POA: Diagnosis not present

## 2019-10-05 DIAGNOSIS — H44002 Unspecified purulent endophthalmitis, left eye: Secondary | ICD-10-CM | POA: Diagnosis present

## 2019-10-05 DIAGNOSIS — I272 Pulmonary hypertension, unspecified: Secondary | ICD-10-CM | POA: Diagnosis present

## 2019-10-05 DIAGNOSIS — G40909 Epilepsy, unspecified, not intractable, without status epilepticus: Secondary | ICD-10-CM | POA: Diagnosis present

## 2019-10-05 DIAGNOSIS — G4733 Obstructive sleep apnea (adult) (pediatric): Secondary | ICD-10-CM | POA: Diagnosis present

## 2019-10-05 DIAGNOSIS — J449 Chronic obstructive pulmonary disease, unspecified: Secondary | ICD-10-CM | POA: Diagnosis present

## 2019-10-05 DIAGNOSIS — D631 Anemia in chronic kidney disease: Secondary | ICD-10-CM | POA: Diagnosis present

## 2019-10-05 DIAGNOSIS — R4182 Altered mental status, unspecified: Secondary | ICD-10-CM | POA: Diagnosis not present

## 2019-10-05 DIAGNOSIS — R471 Dysarthria and anarthria: Secondary | ICD-10-CM | POA: Diagnosis not present

## 2019-10-05 DIAGNOSIS — I120 Hypertensive chronic kidney disease with stage 5 chronic kidney disease or end stage renal disease: Secondary | ICD-10-CM | POA: Diagnosis present

## 2019-10-05 DIAGNOSIS — E1121 Type 2 diabetes mellitus with diabetic nephropathy: Secondary | ICD-10-CM | POA: Diagnosis not present

## 2019-10-05 DIAGNOSIS — Z20822 Contact with and (suspected) exposure to covid-19: Secondary | ICD-10-CM | POA: Diagnosis present

## 2019-10-05 DIAGNOSIS — Z7951 Long term (current) use of inhaled steroids: Secondary | ICD-10-CM | POA: Diagnosis not present

## 2019-10-05 DIAGNOSIS — G9341 Metabolic encephalopathy: Secondary | ICD-10-CM | POA: Diagnosis not present

## 2019-10-05 DIAGNOSIS — E1122 Type 2 diabetes mellitus with diabetic chronic kidney disease: Secondary | ICD-10-CM | POA: Diagnosis present

## 2019-10-05 DIAGNOSIS — M1030 Gout due to renal impairment, unspecified site: Secondary | ICD-10-CM | POA: Diagnosis not present

## 2019-10-05 DIAGNOSIS — R531 Weakness: Secondary | ICD-10-CM | POA: Diagnosis not present

## 2019-10-05 DIAGNOSIS — N2581 Secondary hyperparathyroidism of renal origin: Secondary | ICD-10-CM | POA: Diagnosis present

## 2019-10-05 DIAGNOSIS — D573 Sickle-cell trait: Secondary | ICD-10-CM | POA: Diagnosis present

## 2019-10-05 DIAGNOSIS — Z8701 Personal history of pneumonia (recurrent): Secondary | ICD-10-CM | POA: Diagnosis not present

## 2019-10-05 DIAGNOSIS — I6203 Nontraumatic chronic subdural hemorrhage: Secondary | ICD-10-CM | POA: Diagnosis not present

## 2019-10-05 DIAGNOSIS — G4730 Sleep apnea, unspecified: Secondary | ICD-10-CM | POA: Diagnosis not present

## 2019-10-05 DIAGNOSIS — Z86711 Personal history of pulmonary embolism: Secondary | ICD-10-CM | POA: Diagnosis not present

## 2019-10-05 DIAGNOSIS — I69351 Hemiplegia and hemiparesis following cerebral infarction affecting right dominant side: Secondary | ICD-10-CM | POA: Diagnosis not present

## 2019-10-05 DIAGNOSIS — Q211 Atrial septal defect: Secondary | ICD-10-CM | POA: Diagnosis not present

## 2019-10-05 HISTORY — DX: Altered mental status, unspecified: R41.82

## 2019-10-05 LAB — BASIC METABOLIC PANEL WITH GFR
Anion gap: 13 (ref 5–15)
BUN: 32 mg/dL — ABNORMAL HIGH (ref 6–20)
CO2: 27 mmol/L (ref 22–32)
Calcium: 8.4 mg/dL — ABNORMAL LOW (ref 8.9–10.3)
Chloride: 96 mmol/L — ABNORMAL LOW (ref 98–111)
Creatinine, Ser: 9.34 mg/dL — ABNORMAL HIGH (ref 0.61–1.24)
GFR calc Af Amer: 7 mL/min — ABNORMAL LOW
GFR calc non Af Amer: 6 mL/min — ABNORMAL LOW
Glucose, Bld: 83 mg/dL (ref 70–99)
Potassium: 4.5 mmol/L (ref 3.5–5.1)
Sodium: 136 mmol/L (ref 135–145)

## 2019-10-05 LAB — CBC
HCT: UNDETERMINED % (ref 39.0–52.0)
Hemoglobin: 10.3 g/dL — ABNORMAL LOW (ref 13.0–17.0)
Hemoglobin: 10.1 g/dL — ABNORMAL LOW (ref 13.0–17.0)
MCH: UNDETERMINED pg (ref 26.0–34.0)
MCHC: UNDETERMINED g/dL (ref 30.0–36.0)
MCV: UNDETERMINED fL (ref 80.0–100.0)
Platelets: 239 10*3/uL (ref 150–400)
Platelets: 278 10*3/uL (ref 150–400)
RBC: UNDETERMINED MIL/uL (ref 4.22–5.81)
RDW: UNDETERMINED % (ref 11.5–15.5)
WBC: 7.9 10*3/uL (ref 4.0–10.5)
WBC: 8.5 10*3/uL (ref 4.0–10.5)
nRBC: 10.7 % — ABNORMAL HIGH (ref 0.0–0.2)
nRBC: 9.3 % — ABNORMAL HIGH (ref 0.0–0.2)

## 2019-10-05 LAB — PHOSPHORUS: Phosphorus: 3.2 mg/dL (ref 2.5–4.6)

## 2019-10-05 MED ORDER — ALTEPLASE 2 MG IJ SOLR
2.0000 mg | Freq: Once | INTRAMUSCULAR | Status: DC | PRN
  Filled 2019-10-05: qty 2

## 2019-10-05 MED ORDER — LIDOCAINE HCL (PF) 1 % IJ SOLN: 5.0000 mL | INTRAMUSCULAR | Status: DC | PRN

## 2019-10-05 MED ORDER — LIDOCAINE-PRILOCAINE 2.5-2.5 % EX CREA: 1.0000 "application " | TOPICAL_CREAM | CUTANEOUS | Status: DC | PRN

## 2019-10-05 MED ORDER — PENTAFLUOROPROP-TETRAFLUOROETH EX AERO: 1.0000 "application " | INHALATION_SPRAY | CUTANEOUS | Status: DC | PRN

## 2019-10-05 MED ORDER — CHLORHEXIDINE GLUCONATE CLOTH 2 % EX PADS
6.0000 | MEDICATED_PAD | Freq: Every day | CUTANEOUS | Status: DC
  Administered 2019-10-05 – 2019-10-07 (×3): 6 via TOPICAL

## 2019-10-05 MED ORDER — HEPARIN SODIUM (PORCINE) 1000 UNIT/ML DIALYSIS
1000.0000 [IU] | INTRAMUSCULAR | Status: DC | PRN
  Filled 2019-10-05: qty 1

## 2019-10-05 MED ORDER — SODIUM CHLORIDE 0.9 % IV SOLN: 100.0000 mL | INTRAVENOUS | Status: DC | PRN

## 2019-10-05 MED ORDER — CALCITRIOL 0.25 MCG PO CAPS: 0.2500 ug | ORAL_CAPSULE | ORAL | Status: DC

## 2019-10-05 MED ORDER — CALCITRIOL 0.25 MCG PO CAPS
ORAL_CAPSULE | ORAL | Status: AC
  Administered 2019-10-05: 14:00:00 0.25 ug via ORAL
  Filled 2019-10-05: qty 1

## 2019-10-05 NOTE — Procedures (Addendum)
Patient Name: Isaiah [name]  MRN: [id number]  Epilepsy Attending: Lora Havens  Referring Physician/Provider: Dr Amie [place] Date: 10/05/2019 Duration: 25.21 mins  Patient history:  54 year old with history significant for left MCA stroke with residual right hemiparesis, right subdural hematoma and seizure disorder seen for increased dysarthria and altered mental status.  Level of alertness: awake, drowsy  AEDs during EEG study: clonazepam, vimpat, lamotrigine  Technical aspects: This EEG study was done with scalp electrodes positioned according to the 10-20 International system of electrode placement. Electrical activity was acquired at a sampling rate of 500Hz  and reviewed with a high frequency filter of 70Hz  and a low frequency filter of 1Hz . EEG data were recorded continuously and digitally stored.   DESCRIPTION: The posterior dominant rhythm consists of 7 Hz activity of moderate voltage (25-35 uV) seen predominantly in posterior head regions, symmetric and reactive to eye opening and eye closing. Drowsiness was characterized by attenuation of the posterior background rhythm. EEG showed intermittent 2-3Hz  delta slowing in left temporal slowing. Hyperventilation and photic stimulation were not performed.  ABNORMALITY - Intermittent slow, left temporal region  IMPRESSION: This study is suggestive of non specific cortical dysfunction in left temporal region.   No seizures or epileptiform discharges were seen throughout the recording.  [name] [name]

## 2019-10-05 NOTE — Progress Notes (Signed)
[facility] Progress Note   Subjective:   Patient seen and examined at bedside.  Reports he is feeling better today, weakness improved.  Oriented to place, answered Sept 21, 2021 when asked day, month, year.  Denies SOB, CP, n/v/d, abdominal pain, dizziness and edema.    Objective Vitals:   10/04/19 1821 10/04/19 2149 10/05/19 0536 10/05/19 0859  BP: (!) 97/48 (!) 115/51 134/65   Pulse: 86 75 75   Resp: 17 16 18    Temp: 97.6 F (36.4 C) 97.6 F (36.4 C) 97.7 F (36.5 C)   TempSrc: Oral Oral Oral   SpO2: 99% 100% 100% 98%  Weight: 82.4 kg     Height:       Physical Exam General: Male, sitting up in bed in NAD Heart:RRR Lungs:CTAB Abdomen:soft, NTND Extremities:no LE edema Dialysis Access: RU AVF +b   Filed Weights   10/04/19 0929 10/04/19 1440 10/04/19 1821  Weight: 84.3 kg 83.8 kg 82.4 kg    Intake/Output Summary (Last 24 hours) at 10/05/2019 1052 Last data filed at 10/04/2019 1821 Gross per 24 hour  Intake -  Output 1374 ml  Net -1374 ml    Additional Objective Labs: Basic Metabolic Panel: Recent Labs  Lab 10/04/19 0900 10/04/19 0915 10/05/19 0533  NA 135 134* 136  K 4.9 4.9 4.5  CL 92* 95* 96*  CO2 26  --  27  GLUCOSE 168* 159* 83  BUN 60* 71* 32*  CREATININE 14.87* 15.30* 9.34*  CALCIUM 9.4  --  8.4*   Liver Function Tests: Recent Labs  Lab 10/04/19 0900  AST 23  ALT 13  ALKPHOS 83  BILITOT 1.2  PROT 6.5  ALBUMIN 4.0   CBC: Recent Labs  Lab 10/04/19 0900 10/04/19 0915 10/05/19 0533  WBC 7.2  --  7.9  NEUTROABS 5.8  --   --   HGB Unable to determine due to a cold agglutinin 10.2* 10.3*  HCT Unable to determine due to a cold agglutinin 30.0* Unable to determine due to a cold agglutinin  MCV Unable to determine due to a cold agglutinin  --  Unable to determine due to a cold agglutinin  PLT 265  --  239   Blood Culture    Component Value Date/Time   SDES BLOOD LEFT ANTECUBITAL 06/09/2019 1422   SPECREQUEST  06/09/2019  1422    BOTTLES DRAWN AEROBIC ONLY Blood Culture results may not be optimal due to an inadequate volume of blood received in culture bottles   CULT  06/09/2019 1422    NO GROWTH 5 DAYS Performed at [facility], [place] [address] [place], [place] [address]    REPTSTATUS 06/14/2019 FINAL 06/09/2019 1422   CBG: Recent Labs  Lab 10/04/19 0900  GLUCAP 130*  Studies/Results: EEG  Result Date: 10/05/2019 Lora Havens, MD     10/05/2019  9:25 AM Patient Name: Isaiah [name] [facility] MRN: [id number] Epilepsy Attending: Lora Havens Referring Physician/Provider: Dr Amie Portland Date: 10/05/2019 Duration: 25.21 mins Patient history:  54 year old with history significant for left MCA stroke with residual right hemiparesis, right subdural hematoma and seizure disorder seen for increased dysarthria and altered mental status. Level of alertness: awake, drowsy AEDs during EEG study: clonazepam, vimpat, lamotrigine Technical aspects: This EEG study was done with scalp electrodes positioned according to the 10-20 International system of electrode placement. Electrical activity was acquired at a sampling rate of 500Hz  and reviewed with a high frequency filter of 70Hz  and a low frequency  filter of 1Hz . EEG data were recorded continuously and digitally stored. DESCRIPTION: The posterior dominant rhythm consists of 7 Hz activity of moderate voltage (25-35 uV) seen predominantly in posterior head regions, symmetric and reactive to eye opening and eye closing. Drowsiness was characterized by attenuation of the posterior background rhythm. EEG showed intermittent 2-3Hz  delta slowing in left temporal slowing. Hyperventilation and photic stimulation were not performed. ABNORMALITY - Intermittent slow, left temporal region IMPRESSION: This study is suggestive of non specific cortical dysfunction in left temporal region.  No seizures or epileptiform discharges were seen throughout the recording. Lora Havens   CT Code  Stroke CTA Head W/WO contrast  Result Date: 10/04/2019 CLINICAL DATA:  Code stroke, follow-up EXAM: CT ANGIOGRAPHY HEAD AND NECK CT PERFUSION BRAIN TECHNIQUE: Multidetector CT imaging of the head and neck was performed using the standard protocol during bolus administration of intravenous contrast. Multiplanar CT image reconstructions and MIPs were obtained to evaluate the vascular anatomy. Carotid stenosis measurements (when applicable) are obtained utilizing NASCET criteria, using the distal internal carotid diameter as the denominator. Multiphase CT imaging of the brain was performed following IV bolus contrast injection. Subsequent parametric perfusion maps were calculated using RAPID software. CONTRAST:  128m OMNIPAQUE IOHEXOL 350 MG/ML SOLN COMPARISON:  None. FINDINGS: CTA NECK FINDINGS Aortic arch: Great vessel origins are patent. Right carotid system: Patent. Minimal calcified plaque at the ICA origin without measurable stenosis. Left carotid system: Patent. Mild calcified and noncalcified plaque at the ICA origin causing minimal stenosis. Vertebral arteries: Patent and codominant. No stenosis or evidence of dissection. Skeleton: Mild degenerative changes of the cervical spine. Other neck: A lipoma is present in the right neck mildly displacing the submandibular gland. No adenopathy. Upper chest: No apical lung mass. Review of the MIP images confirms the above findings CTA HEAD FINDINGS Anterior circulation: Intracranial internal carotid arteries patent with mild calcified plaque. Anterior and middle cerebral arteries are patent. Left A1 ACA is dominant. Posterior circulation: Intracranial vertebral arteries, basilar artery, and posterior cerebral arteries are patent. A left posterior communicating artery is present. Venous sinuses: As permitted by contrast timing, patent. Review of the MIP images confirms the above findings CT Brain Perfusion Findings: CBF (<30%) Volume: 078m There is expected decreased  CBF and CBV corresponding to the chronic infarcts on CT. Perfusion (Tmax>6.0s) volume: 52m34mismatch Volume: 52mL35mfarction Location: None IMPRESSION: No large vessel occlusion or hemodynamically significant stenosis. No evidence of territory at risk by perfusion imaging. Electronically Signed   By: PranMacy Mis.   On: 10/04/2019 09:45   CT Code Stroke CTA Neck W/WO contrast  Result Date: 10/04/2019 CLINICAL DATA:  Code stroke, follow-up EXAM: CT ANGIOGRAPHY HEAD AND NECK CT PERFUSION BRAIN TECHNIQUE: Multidetector CT imaging of the head and neck was performed using the standard protocol during bolus administration of intravenous contrast. Multiplanar CT image reconstructions and MIPs were obtained to evaluate the vascular anatomy. Carotid stenosis measurements (when applicable) are obtained utilizing NASCET criteria, using the distal internal carotid diameter as the denominator. Multiphase CT imaging of the brain was performed following IV bolus contrast injection. Subsequent parametric perfusion maps were calculated using RAPID software. CONTRAST:  1052mL93mIPAQUE IOHEXOL 350 MG/ML SOLN COMPARISON:  None. FINDINGS: CTA NECK FINDINGS Aortic arch: Great vessel origins are patent. Right carotid system: Patent. Minimal calcified plaque at the ICA origin without measurable stenosis. Left carotid system: Patent. Mild calcified and noncalcified plaque at the ICA origin causing minimal stenosis. Vertebral arteries: Patent and codominant. No stenosis  or evidence of dissection. Skeleton: Mild degenerative changes of the cervical spine. Other neck: A lipoma is present in the right neck mildly displacing the submandibular gland. No adenopathy. Upper chest: No apical lung mass. Review of the MIP images confirms the above findings CTA HEAD FINDINGS Anterior circulation: Intracranial internal carotid arteries patent with mild calcified plaque. Anterior and middle cerebral arteries are patent. Left A1 ACA is dominant.  Posterior circulation: Intracranial vertebral arteries, basilar artery, and posterior cerebral arteries are patent. A left posterior communicating artery is present. Venous sinuses: As permitted by contrast timing, patent. Review of the MIP images confirms the above findings CT Brain Perfusion Findings: CBF (<30%) Volume: 58m. There is expected decreased CBF and CBV corresponding to the chronic infarcts on CT. Perfusion (Tmax>6.0s) volume: 031mMismatch Volume: 66m41mnfarction Location: None IMPRESSION: No large vessel occlusion or hemodynamically significant stenosis. No evidence of territory at risk by perfusion imaging. Electronically Signed   By: PraMacy MisD.   On: 10/04/2019 09:45   CT Code Stroke Cerebral Perfusion with contrast  Result Date: 10/04/2019 CLINICAL DATA:  Code stroke, follow-up EXAM: CT ANGIOGRAPHY HEAD AND NECK CT PERFUSION BRAIN TECHNIQUE: Multidetector CT imaging of the head and neck was performed using the standard protocol during bolus administration of intravenous contrast. Multiplanar CT image reconstructions and MIPs were obtained to evaluate the vascular anatomy. Carotid stenosis measurements (when applicable) are obtained utilizing NASCET criteria, using the distal internal carotid diameter as the denominator. Multiphase CT imaging of the brain was performed following IV bolus contrast injection. Subsequent parametric perfusion maps were calculated using RAPID software. CONTRAST:  1066m73mNIPAQUE IOHEXOL 350 MG/ML SOLN COMPARISON:  None. FINDINGS: CTA NECK FINDINGS Aortic arch: Great vessel origins are patent. Right carotid system: Patent. Minimal calcified plaque at the ICA origin without measurable stenosis. Left carotid system: Patent. Mild calcified and noncalcified plaque at the ICA origin causing minimal stenosis. Vertebral arteries: Patent and codominant. No stenosis or evidence of dissection. Skeleton: Mild degenerative changes of the cervical spine. Other neck: A  lipoma is present in the right neck mildly displacing the submandibular gland. No adenopathy. Upper chest: No apical lung mass. Review of the MIP images confirms the above findings CTA HEAD FINDINGS Anterior circulation: Intracranial internal carotid arteries patent with mild calcified plaque. Anterior and middle cerebral arteries are patent. Left A1 ACA is dominant. Posterior circulation: Intracranial vertebral arteries, basilar artery, and posterior cerebral arteries are patent. A left posterior communicating artery is present. Venous sinuses: As permitted by contrast timing, patent. Review of the MIP images confirms the above findings CT Brain Perfusion Findings: CBF (<30%) Volume: 66mL.86mere is expected decreased CBF and CBV corresponding to the chronic infarcts on CT. Perfusion (Tmax>6.0s) volume: 66mL M76match Volume: 66mL In54mction Location: None IMPRESSION: No large vessel occlusion or hemodynamically significant stenosis. No evidence of territory at risk by perfusion imaging. Electronically Signed   By: PraneilMacy Mis On: 10/04/2019 09:45   DG Chest Portable 1 View  Result Date: 10/04/2019 CLINICAL DATA:  Weakness. EXAM: PORTABLE CHEST 1 VIEW COMPARISON:  06/09/2019 FINDINGS: Numerous leads and wires project over the chest. Patient rotated left. Midline trachea. Mild cardiomegaly. No pleural effusion or pneumothorax. Clear lungs. IMPRESSION: No acute cardiopulmonary disease. Electronically Signed   By: Kyle  TAbigail Miyamoto On: 10/04/2019 10:28   CT HEAD CODE STROKE WO CONTRAST  Result Date: 10/04/2019 CLINICAL DATA:  Code stroke.  Slurred speech. EXAM: CT HEAD WITHOUT CONTRAST TECHNIQUE: Contiguous axial images  were obtained from the base of the skull through the vertex without intravenous contrast. COMPARISON:  07/14/2019 FINDINGS: Brain: A subdural hematoma laterally over the right cerebral convexity demonstrates increased density compared to the prior study though remains hypoattenuating  relative to cortex and has not significantly changed in size with a maximal thickness of 9 mm. No definite acute/hyperdense blood products are identified. Leftward midline shift measures 3 mm, unchanged. Chronic right occipital, left frontal, left temporal lobe, and left cerebellar infarcts are unchanged. Hypodensities elsewhere in the cerebral white matter bilaterally and in the pons are nonspecific but compatible with mild chronic small vessel ischemic disease. There is mild cerebral atrophy. Vascular: Calcified atherosclerosis at the skull base. No hyperdense vessel. Skull: No fracture suspicious osseous lesion. Sinuses/Orbits: Unchanged left maxillary sinus mucous retention cyst. Clear mastoid air cells. Bilateral cataract extraction. Other: Unchanged 1.6 cm left suboccipital sebaceous cyst. ASPECTS ([place] Stroke Program Early CT Score) - Ganglionic level infarction (caudate, lentiform nuclei, internal capsule, insula, M1-M3 cortex): 7 - Supraganglionic infarction (M4-M6 cortex): 3 Total score (0-10 with 10 being normal): 10 IMPRESSION: 1. No acute infarct identified.  ASPECTS of 10. 2. Chronic subdural hematoma over the right cerebral convexity, mildly increased in density compared to the prior CT though unchanged in size and without definite acute hemorrhage. Unchanged 3 mm of leftward midline shift. 3. Chronic bilateral cerebral and left cerebellar infarcts. These results were communicated to Dr. Rory Percy at 9:21 am on 10/04/2019 by text page via the [facility] messaging system. Electronically Signed   By: Logan Bores M.D.   On: 10/04/2019 09:22    Medications:  . acyclovir  400 mg Oral QHS  . calcitRIOL  0.25 mcg Oral Q M,W,F  . Chlorhexidine Gluconate Cloth  6 each Topical Q0600  . clonazePAM  1 mg Oral BID  . fluticasone furoate-vilanterol  1 puff Inhalation Daily  . heparin  5,000 Units Subcutaneous Q8H  . ketorolac  1 drop Both Eyes QID  . lacosamide  200 mg Oral BID  . lacosamide  200 mg Oral Q  M,W,F-1800  . lamoTRIgine  150 mg Oral BID  . loratadine  10 mg Oral Daily  . metoprolol tartrate  12.5 mg Oral BID  . multivitamin  1 tablet Oral QHS  . ofloxacin  1 drop Left Eye QID  . prednisoLONE acetate  1 drop Left Eye TID    Dialysis Orders: MWF - [facility]  4hrs, BFR 400, DFR 800,  VVO16, 2K/ 2Ca  Access: RU AVF  Heparin none Mircera 30 mcg q2wks - last 09/28/19 Venofer 3m qwk - last 09/28/19 Calcitriol 0.266m PO qHD  Assessment/Plan: 1.  AMS - per neuro likely metabolic encephalopathy related to uremia due to missing HD yesterday.  Still with come confusion.  EEG showed non specific cortical dysfunction in L temporal region.  No seizures.  Per neuro/primary. 2. Acute on chronic SDH - worsened neurologic sx over last few days.  CT shows no acute changes. Per neuro. 3.  ESRD -  On HD MWF. HD yesterday to make up for missing Monday.  HD again today, in hospital, per regular schedule.  4.  Hypertension/volume  - Blood pressure well controlled. Goal 140 in setting SDH. On Norvasc and Lopressor.  Does not appear volume overload, titrate down as tolerated.  5.  Anemia of CKD - Hgb 10.3. ESA recently dosed. Follow trends.  6.  Secondary Hyperparathyroidism -  Ca at goal. Checking phos.  Not on binders.  Continue VDRA. 7.  Nutrition - Renal diet w/fluid restrictions.  8. Multiple myeloma - followed by oncology 9. Recent aspiration PNA - on Cipro per primary 10. Seizure d/o - followed closely as OP.  Continue vimpat, lamictal. EEG today showed no seizure activity.  11. A fib - not on anticoag d/t Hx GIB 12. DMT2 13. Hx Proteus bacteremia - rechecking blood cultures  Jen Mow, [facility] Pager: [phone] 10/05/2019,10:52 AM  LOS: 0 days

## 2019-10-05 NOTE — TOC Initial Note (Signed)
Transition of Care [facility]) - Initial/Assessment Note    Patient Details  Name: Darian Cansler [name] MRN: [id number] Date of Birth: Jul 23, 1966  [facility]) CM/SW Contact:    Marilu Favre, RN Phone Number: 10/05/2019, 12:42 PM  Clinical Narrative:                  Spoke to patient's wife [facility] [phone]. Patient from home with wife and home health PT/OT with [facility]. Patient has walker, shower chair and 3 in 1 already at home.   Spoke with Malachy Mood with [facility] , will need resumption of care orders. Expected Discharge Plan: [place] Barriers to Discharge: Continued Medical Work up   Patient Goals and CMS Choice Patient states their goals for this hospitalization and ongoing recovery are:: to return to home CMS Medicare.gov Compare Post Acute Care list provided to:: Patient Choice offered to / list presented to : Spouse  Expected Discharge Plan and Services Expected Discharge Plan: [place]   Discharge Planning Services: CM Consult Post Acute Care Choice: [place] arrangements for the past 2 months: Single Family Home                 DME Arranged: N/A         HH Arranged: PT, OT HH Agency: [place] Date [place]: 10/05/19 Time [place]: [id number] Representative spoke with at [place]: [place] Arrangements/Services Living arrangements for the past 2 months: [place] with:: Spouse   Do you feel safe going back to the place where you live?: Yes      Need for Family Participation in Patient Care: Yes (Comment) Care giver support system in place?: Yes (comment) Current home services: Home OT, Home PT, DME Criminal Activity/Legal Involvement Pertinent to Current Situation/Hospitalization: No - Comment as needed  Activities of Daily Living Home Assistive Devices/Equipment: Walker (specify type) ADL Screening (condition at time of  admission) Patient's cognitive ability adequate to safely complete daily activities?: Yes Is the patient deaf or have difficulty hearing?: No Does the patient have difficulty seeing, even when wearing glasses/contacts?: Yes Does the patient have difficulty concentrating, remembering, or making decisions?: No Patient able to express need for assistance with ADLs?: Yes Does the patient have difficulty dressing or bathing?: Yes Independently performs ADLs?: No Communication: Independent Dressing (OT): Needs assistance Is this a change from baseline?: Pre-admission baseline Grooming: Needs assistance Is this a change from baseline?: Pre-admission baseline Feeding: Needs assistance Is this a change from baseline?: Pre-admission baseline Bathing: Needs assistance Is this a change from baseline?: Pre-admission baseline Toileting: Needs assistance Is this a change from baseline?: Pre-admission baseline In/Out Bed: Needs assistance Is this a change from baseline?: Pre-admission baseline Walks in Home: Needs assistance Is this a change from baseline?: Pre-admission baseline Does the patient have difficulty walking or climbing stairs?: Yes Weakness of Legs: Both Weakness of Arms/Hands: None  Permission Sought/Granted   Permission granted to share information with : Yes, Verbal Permission Granted  Share Information with NAME: [facility] granted to share info w Relationship: wife     Emotional Assessment         Alcohol / Substance Use: Illicit Drugs Psych Involvement: No (comment)  Admission diagnosis:  Weakness [R53.1] Encephalopathy [E83.15] Acute metabolic encephalopathy [V76.16] Nausea and vomiting, intractability of vomiting not specified, unspecified vomiting type [R11.2] Patient  Active Problem List   Diagnosis Date Noted  . Acute metabolic encephalopathy 46/56/8127  . Abnormality of gait 09/13/2019  . Diabetes mellitus with end-stage renal  disease ([place]) 08/07/2019  . Benign hypertensive kidney disease with chronic kidney disease 08/07/2019  . Spastic hemiparesis ([place]) 07/12/2019  . Dysphagia, oropharyngeal phase 07/05/2019  . Allergy, unspecified, initial encounter 06/27/2019  . PAF (paroxysmal atrial fibrillation) ([place])   . Bacteremia   . Labile blood pressure   . Labile blood glucose   . Controlled type 2 diabetes mellitus with hyperglycemia, without long-term current use of insulin ([place])   . ESRD on dialysis ([place])   . Right sided weakness   . SDH (subdural hematoma) ([place])   . Chronic intracranial subdural hematoma (HCC) 06/09/2019  . Multiple myeloma not having achieved remission ([place]) 06/09/2019  . ESRD on hemodialysis ([place]) 06/09/2019  . Essential hypertension 06/09/2019  . DM type 2 (diabetes mellitus, type 2) ([place]) 06/09/2019  . Seizure disorder ([place]) 06/09/2019  . Aspiration pneumonia ([place]) 06/09/2019  . History of bacteremia 06/09/2019  . Atrial fibrillation, chronic ([place]) 06/09/2019  . Bilateral kidney stones 05/31/2019  . Proteus infection 05/28/2019  . History of restrictive pulmonary disease 04/07/2019  . Multiple myeloma ([place]) 05/17/2018  . Goals of care, counseling/discussion 05/17/2018  . Partial idiopathic epilepsy with seizures of localized onset, intractable, without status epilepticus ([place]) 11/02/2017  . Iron deficiency anemia, unspecified 01/22/2017  . COPD (chronic obstructive pulmonary disease) ([place]) 11/11/2016  . CVA (cerebral vascular accident) ([place]) 11/11/2016  . GERD (gastroesophageal reflux disease) 11/11/2016  . GI bleed 11/11/2016  . 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  . Obesity (BMI 30-39.9) 11/11/2016  . Pulmonary hypertension ([place]) 11/11/2016  . Patent foramen ovale 11/11/2016  . Hyperparathyroidism, secondary renal ([place]) 11/11/2016  . VTE (venous thromboembolism) 11/11/2016  . Hypotension 11/11/2016  . Systemic hypertension 11/11/2016  . Seizure ([place]) 11/11/2016   . Other seizures ([place]) 07/25/2016  . Encounter for removal of sutures 05/27/2016  . Ingrown nail 03/03/2016  . Onychomycosis due to dermatophyte 03/03/2016  . Other acquired hammer toe 03/03/2016  . Hemiparesis affecting right side as late effect of cerebrovascular accident ([place]) 02/28/2016  . Aphasia complicating stroke 51/70/0174  . Seizures ([place]) 01/08/2016  . History of ischemic left MCA stroke 01/08/2016  . Right sided weakness 01/08/2016  . Atrial fibrillation ([place]) 01/08/2016  . Leukocytosis 01/08/2016  . Benign essential HTN 01/08/2016  . Anemia of chronic disease 01/08/2016  . Controlled type 2 diabetes mellitus with diabetic nephropathy, without long-term current use of insulin ([place]) 01/08/2016  . History of DVT (deep vein thrombosis) 01/08/2016  . 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 Hypocalcemia 12/26/2015  . Long term (current) use of anticoagulants 11/07/2015  . Aftercare including intermittent dialysis ([place]) 11/02/2015  . Other specified coagulation defects ([place]) 11/02/2015  . Complication of vascular dialysis catheter 11/02/2015  . Diarrhea, unspecified 11/02/2015  . Fever, unspecified 11/02/2015  . Pruritus, unspecified 11/02/2015  . Shortness of breath 11/02/2015  . Type 2 diabetes mellitus with diabetic peripheral angiopathy without gangrene ([place]) 11/02/2015  . ESRD (end stage renal disease) on dialysis [facility])    PCP:  [name], MD Pharmacy:   [place], [place] - [address] [place] [address] Phone: [phone] Fax: [phone]     Social Determinants of Health (SDOH) Interventions    Readmission Risk Interventions Readmission Risk Prevention Plan 06/14/2019  [place] or Home Care Consult Complete  Social  Work Consult for [place] Planning/Counseling [place] Not Applicable  Some recent data might be hidden

## 2019-10-05 NOTE — Plan of Care (Signed)
EEG with left-sided slowing, likely secondary to the prior left MCA stroke. No seizures  No new recommendations other than as listed in the progress note from earlier today.  Neurology will be available as needed.  Please call with questions  -- Amie Portland, MD [facility] Pager: [phone] If 7pm to 7am, please call on call as listed on AMION.

## 2019-10-05 NOTE — Evaluation (Signed)
Physical Therapy Evaluation Patient Details Name: Isaiah [name] [facility] MRN: [id number] DOB: 09/03/1966 Today's Date: 10/05/2019   History of Present Illness  Pt is a 54 y/o male admitted secondary to acute metabolic encephalopathy likely from missed HD. PMH includes HTN, ESRD on HD, CVA with R sided deficits, COPD, a fib, and multiple myeloma.   Clinical Impression  Pt admitted secondary to problem above with deficits below. Pt requiring min A to ambulate within the room using RW. Pt reports gait is close to baseline and reports wife will be able to assist at d/c. Feel pt would benefit from [place] if wife able to provide necessary assist. Will continue to follow acutely to maximize functional mobility independence and safety.     Follow Up Recommendations Home health PT;Supervision/Assistance - 24 hour(As long as wife able to perform necessary assist)    Equipment Recommendations  None recommended by PT    Recommendations for Other Services       Precautions / Restrictions Precautions Precautions: Fall Restrictions Weight Bearing Restrictions: No      Mobility  Bed Mobility Overal bed mobility: Needs Assistance Bed Mobility: Supine to Sit;Sit to Supine     Supine to sit: Min assist Sit to supine: Supervision   General bed mobility comments: Min A to scoot hips to EOB once sitting. Increased time required.   Transfers Overall transfer level: Needs assistance Equipment used: Rolling walker (2 wheeled) Transfers: Sit to/from Stand Sit to Stand: Min assist         General transfer comment: Heavy min A for lift assist. Cues for safe hand placement.   Ambulation/Gait Ambulation/Gait assistance: Min assist Gait Distance (Feet): 15 Feet Assistive device: Rolling walker (2 wheeled) Gait Pattern/deviations: Step-to pattern;Decreased step length - right;Decreased step length - left;Decreased weight shift to right Gait velocity: Decreased   General Gait Details: Min A for steadying  assist for gait within the room. Mild RLE instability, however, no overt LOB noted. Pt reports ambulation is close to baseline  Stairs            Wheelchair Mobility    Modified Rankin (Stroke Patients Only)       Balance Overall balance assessment: Needs assistance Sitting-balance support: No upper extremity supported;Feet supported Sitting balance-Leahy Scale: Fair     Standing balance support: Bilateral upper extremity supported;During functional activity Standing balance-Leahy Scale: Poor Standing balance comment: Reliant on BUE support                              Pertinent Vitals/Pain Pain Assessment: No/denies pain    Home Living Family/patient expects to be discharged to:: Private residence Living Arrangements: Spouse/significant other Available Help at Discharge: Family;Available 24 hours/day Type of Home: House Home Access: Stairs to enter Entrance Stairs-Rails: None Entrance Stairs-Number of Steps: 2 Home Layout: Two level;1/2 bath on main level;Bed/bath upstairs Home Equipment: Grab bars - tub/shower;Hand held shower head;Walker - 4 wheels;Walker - 2 wheels;Transport chair      Prior Function Level of Independence: Needs assistance   Gait / Transfers Assistance Needed: Reports wife assists him with ambulation and stair navigation. Uses RW for ambulation.   ADL's / Homemaking Assistance Needed: Pt reports he is able to bathe and dress himself.         Hand Dominance        Extremity/Trunk Assessment   Upper Extremity Assessment Upper Extremity Assessment: RUE deficits/detail RUE Deficits / Details: RUE weakness at baseline  Lower Extremity Assessment Lower Extremity Assessment: RLE deficits/detail RLE Deficits / Details: RLE weakness at baseline secondary to CVA    Cervical / Trunk Assessment Cervical / Trunk Assessment: Kyphotic  Communication   Communication: No difficulties  Cognition Arousal/Alertness:  Awake/alert Behavior During Therapy: WFL for tasks assessed/performed Overall Cognitive Status: No family/caregiver present to determine baseline cognitive functioning                                 General Comments: Alert and oriented X4. Some slowed processing, but able to follow simple one step commands.       General Comments General comments (skin integrity, edema, etc.): No family present    Exercises     Assessment/Plan    PT Assessment Patient needs continued PT services  PT Problem List Decreased strength;Decreased balance;Decreased mobility;Decreased knowledge of precautions;Decreased knowledge of use of DME;Decreased cognition       PT Treatment Interventions Gait training;DME instruction;Stair training;Functional mobility training;Therapeutic activities;Therapeutic exercise;Balance training;Patient/family education    PT Goals (Current goals can be found in the Care Plan section)  Acute Rehab PT Goals Patient Stated Goal: to go home PT Goal Formulation: With patient Time For Goal Achievement: 10/19/19 Potential to Achieve Goals: Good    Frequency Min 3X/week   Barriers to discharge        Co-evaluation               AM-PAC PT "6 Clicks" Mobility  Outcome Measure Help needed turning from your back to your side while in a flat bed without using bedrails?: A Little Help needed moving from lying on your back to sitting on the side of a flat bed without using bedrails?: A Little Help needed moving to and from a bed to a chair (including a wheelchair)?: A Little Help needed standing up from a chair using your arms (e.g., wheelchair or bedside chair)?: A Little Help needed to walk in hospital room?: A Little Help needed climbing 3-5 steps with a railing? : A Lot 6 Click Score: 17    End of Session Equipment Utilized During Treatment: Gait belt Activity Tolerance: Patient tolerated treatment well Patient left: in bed;with call bell/phone  within reach;with bed alarm set Nurse Communication: Mobility status PT Visit Diagnosis: Unsteadiness on feet (R26.81);Muscle weakness (generalized) (M62.81)    Time: 1043-1100 PT Time Calculation (min) (ACUTE ONLY): 17 min   Charges:   PT Evaluation $PT Eval Moderate Complexity: 1 Mod          Reuel Derby, PT, DPT  Acute Rehabilitation Services  Pager: [phone] Office: [phone]   Rudean Hitt 10/05/2019, 1:44 PM

## 2019-10-05 NOTE — Telephone Encounter (Signed)
I tried to returned a call to Isaiah [name] with [facility] number [phone] and keep getting a busy signal.   I will try again.

## 2019-10-05 NOTE — Progress Notes (Signed)
Progress Note    [facility]  VOJ:[id number] DOB: [birth year]  DOA: 10/04/2019 PCP: [name], MD    Brief Narrative:     Medical records reviewed and are as summarized below:  Isaiah [name] is an 54 y.o. male with multiple medical issues.  It appears patient missed hemodialysis on Monday due to questionable vomiting.  Patient got a round of hemodialysis yesterday with some improvement in symptoms but not resolution.  Assessment/Plan:   Principal Problem:   Acute metabolic encephalopathy Active Problems:   ESRD (end stage renal disease) on dialysis (HCC)   Benign essential HTN   Controlled type 2 diabetes mellitus with diabetic nephropathy, without long-term current use of insulin (HCC)   Hemiparesis affecting right side as late effect of cerebrovascular accident ([place])   Partial idiopathic epilepsy with seizures of localized onset, intractable, without status epilepticus ([place])   Chronic intracranial subdural hematoma (HCC)   Multiple myeloma not having achieved remission (HCC)   Atrial fibrillation, chronic (HCC)   COPD (chronic obstructive pulmonary disease) ([place])   AMS - Metabolic encepahlopathy from missed HD, likely cause -Patient presenting with encephalopathy as evidenced by his confusion, difficulty answering questions -Initial concern was for CVA but further evaluation makes it appears that this is uremia causing a metabolic encephalopathy - likely associated with missing HD yesterday  -Patient on chronic MWF HD -Nephrology consult with HD on 1/26 -Continue renavite -Continue Klonopin -Wife reports that Sensipar is on hold -Patient appears to still be confused so will ask for dialysis to be done today -EEG done with intermittent slow in the left temporal region: Questionable related to old CVAs -PT eval requested to be sure patient is returning to his prior home status  Chronic SDH -Patient withprior admission for acute on chronic R-sided SDH -Given  his encephalopathy on presentation, there was concern that this could be related to acute CVA -Neurology evaluated and instead thought this was more likely related to uremia -Has impaired mobility at baseline per chart review  Multiple myeloma -Patient had been previously followed for MGUS but bone marrow biopsy in 2018 showed almost 20% plasma cell infiltration -Negative skeletal survey in 2/19 -Treatment was deferred in 4/19 due to lack of symptoms -8/19 bone marrow biopsy with 25% abnormal plasma cells, FISH with IgG kappa -Skeletal survey remained negative -He was recently admitted to [facility] for autologous stem cell transplant, but this was delayed due to other medical problems that occurred while hospitalized -He is currently receiving weekly treatment with Darzalex Faspro with Decadron weekly; will hold for right now given acute presentation  Seizure d/o -Started after patient's CVA -Continue Vimpat, Lamictal  Afib -Reported h/o afib and PFO -IVC filter is in place -He is not a candidate for [facility] due to multiple falls and subdural hematoma -Rate control with metoprolol  DM -He appears to be only on SSI -Recent A1c was 5.1 -He does not appear to need treatment for this at this time  HTN -Continue Lopressor -Norvasc is on hold, as per wife  COPD -Continue Breo Ellipta -Continue Albuterol HFA prn  L eye infection -Continue Acular, ofloxacin drops, and Pred Forte   Family Communication/Anticipated D/C date and plan/Code Status   DVT prophylaxis: Heparin Code Status: Full Code.  Family Communication:  Disposition Plan: Patient needs PT eval as well as routine dialysis today: Hope to discharge home in the a.m. if patient back to his baseline   Medical Consultants:    Renal  Neurology  Subjective:  Would like to eat, per nursing able to take pills with no issue  Objective:    Vitals:   10/04/19 1821 10/04/19 2149 10/05/19 0536 10/05/19 0859  BP:  (!) 97/48 (!) 115/51 134/65   Pulse: 86 75 75   Resp: _0 Temp: 97.6 F (36.4 C) 97.6 F (36.4 C) 97.7 F (36.5 C)   TempSrc: Oral Oral Oral   SpO2: 99% 100% 100% 98%  Weight: 82.4 kg     Height:        Intake/Output Summary (Last 24 hours) at 10/05/2019 0953 Last data filed at 10/04/2019 1821 Gross per 24 hour  Intake --  Output 1374 ml  Net -1374 ml   Filed Weights   10/04/19 0929 10/04/19 1440 10/04/19 1821  Weight: 84.3 kg 83.8 kg 82.4 kg    Exam: In bed, confused about date Regular rate and rhythm No increased work of breathing Mild asterixis, right arm weakness Oriented to person but not date   Data Reviewed:   I have personally reviewed following labs and imaging studies:  Labs: Labs show the following:   Basic Metabolic Panel: Recent Labs  Lab 10/04/19 0900 10/04/19 0900 10/04/19 0915 10/05/19 0533  NA 135  --  134* 136  K 4.9   < > 4.9 4.5  CL 92*  --  95* 96*  CO2 26  --   --  27  GLUCOSE 168*  --  159* 83  BUN 60*  --  71* 32*  CREATININE 14.87*  --  15.30* 9.34*  CALCIUM 9.4  --   --  8.4*   < > = values in this interval not displayed.   GFR Estimated Creatinine Clearance: 9.2 mL/min (A) (by C-G formula based on SCr of 9.34 mg/dL (H)). Liver Function Tests: Recent Labs  Lab 10/04/19 0900  AST 23  ALT 13  ALKPHOS 83  BILITOT 1.2  PROT 6.5  ALBUMIN 4.0   No results for input(s): LIPASE, AMYLASE in the last 168 hours. No results for input(s): AMMONIA in the last 168 hours. Coagulation profile Recent Labs  Lab 10/04/19 0900  INR 1.0    CBC: Recent Labs  Lab 10/04/19 0900 10/04/19 0915 10/05/19 0533  WBC 7.2  --  7.9  NEUTROABS 5.8  --   --   HGB Unable to determine due to a cold agglutinin 10.2* 10.3*  HCT Unable to determine due to a cold agglutinin 30.0* Unable to determine due to a cold agglutinin  MCV Unable to determine due to a cold agglutinin  --  Unable to determine due to a cold agglutinin  PLT 265  --   239   Cardiac Enzymes: No results for input(s): CKTOTAL, CKMB, CKMBINDEX, TROPONINI in the last 168 hours. BNP (last 3 results) No results for input(s): PROBNP in the last 8760 hours. CBG: Recent Labs  Lab 10/04/19 0900  GLUCAP 130*   D-Dimer: No results for input(s): DDIMER in the last 72 hours. Hgb A1c: No results for input(s): HGBA1C in the last 72 hours. Lipid Profile: No results for input(s): CHOL, HDL, LDLCALC, TRIG, CHOLHDL, LDLDIRECT in the last 72 hours. Thyroid function studies: No results for input(s): TSH, T4TOTAL, T3FREE, THYROIDAB in the last 72 hours.  Invalid input(s): FREET3 Anemia work up: No results for input(s): VITAMINB12, FOLATE, FERRITIN, TIBC, IRON, RETICCTPCT in the last 72 hours. Sepsis Labs: Recent Labs  Lab 10/04/19 0900 10/05/19 0533  WBC 7.2 7.9    Microbiology Recent Results (  from the past 240 hour(s))  Respiratory Panel by RT PCR (Flu A&B, Covid) - Nasopharyngeal Swab     Status: None   Collection Time: 10/04/19 10:16 AM   Specimen: Nasopharyngeal Swab  Result Value Ref Range Status   SARS Coronavirus 2 by RT PCR NEGATIVE NEGATIVE Final    Comment: (NOTE) SARS-CoV-2 target nucleic acids are NOT DETECTED. The SARS-CoV-2 RNA is generally detectable in upper respiratoy specimens during the acute phase of infection. The lowest concentration of SARS-CoV-2 viral copies this assay can detect is 131 copies/mL. A negative result does not preclude SARS-Cov-2 infection and should not be used as the sole basis for treatment or other patient management decisions. A negative result may occur with  improper specimen collection/handling, submission of specimen other than nasopharyngeal swab, presence of viral mutation(s) within the areas targeted by this assay, and inadequate number of viral copies (<131 copies/mL). A negative result must be combined with clinical observations, patient history, and epidemiological information. The expected result is  Negative. Fact Sheet for Patients:  PinkCheek.be Fact Sheet for Healthcare Providers:  GravelBags.it This test is not yet ap proved or cleared by the [place] FDA and  has been authorized for detection and/or diagnosis of SARS-CoV-2 by FDA under an Emergency Use Authorization (EUA). This EUA will remain  in effect (meaning this test can be used) for the duration of the COVID-19 declaration under Section 564(b)(1) of the Act, 21 U.S.C. section 360bbb-3(b)(1), unless the authorization is terminated or revoked sooner.    Influenza A by PCR NEGATIVE NEGATIVE Final   Influenza B by PCR NEGATIVE NEGATIVE Final    Comment: (NOTE) The Xpert Xpress SARS-CoV-2/FLU/RSV assay is intended as an aid in  the diagnosis of influenza from Nasopharyngeal swab specimens and  should not be used as a sole basis for treatment. Nasal washings and  aspirates are unacceptable for Xpert Xpress SARS-CoV-2/FLU/RSV  testing. Fact Sheet for Patients: PinkCheek.be Fact Sheet for Healthcare Providers: GravelBags.it This test is not yet approved or cleared by the [place] FDA and  has been authorized for detection and/or diagnosis of SARS-CoV-2 by  FDA under an Emergency Use Authorization (EUA). This EUA will remain  in effect (meaning this test can be used) for the duration of the  Covid-19 declaration under Section 564(b)(1) of the Act, 21  U.S.C. section 360bbb-3(b)(1), unless the authorization is  terminated or revoked. Performed at [facility], [place] [address], [place], [place] [address]     Procedures and diagnostic studies:  EEG  Result Date: 10/05/2019 Lora Havens, MD     10/05/2019  9:25 AM Patient Name: Isaiah [name] [facility] MRN: [id number] Epilepsy Attending: Lora Havens Referring Physician/Provider: Dr Amie [place] Date: 10/05/2019 Duration: 25.21 mins Patient history:   54 year old with history significant for left MCA stroke with residual right hemiparesis, right subdural hematoma and seizure disorder seen for increased dysarthria and altered mental status. Level of alertness: awake, drowsy AEDs during EEG study: clonazepam, vimpat, lamotrigine Technical aspects: This EEG study was done with scalp electrodes positioned according to the 10-20 International system of electrode placement. Electrical activity was acquired at a sampling rate of 500Hz and reviewed with a high frequency filter of 70Hz and a low frequency filter of 1Hz. EEG data were recorded continuously and digitally stored. DESCRIPTION: The posterior dominant rhythm consists of 7 Hz activity of moderate voltage (25-35 uV) seen predominantly in posterior head regions, symmetric and reactive to eye opening and eye closing. Drowsiness was  characterized by attenuation of the posterior background rhythm. EEG showed intermittent 2-3Hz delta slowing in left temporal slowing. Hyperventilation and photic stimulation were not performed. ABNORMALITY - Intermittent slow, left temporal region IMPRESSION: This study is suggestive of non specific cortical dysfunction in left temporal region.  No seizures or epileptiform discharges were seen throughout the recording. Priyanka O Yadav   CT Code Stroke CTA Head W/WO contrast  Result Date: 10/04/2019 CLINICAL DATA:  Code stroke, follow-up EXAM: CT ANGIOGRAPHY HEAD AND NECK CT PERFUSION BRAIN TECHNIQUE: Multidetector CT imaging of the head and neck was performed using the standard protocol during bolus administration of intravenous contrast. Multiplanar CT image reconstructions and MIPs were obtained to evaluate the vascular anatomy. Carotid stenosis measurements (when applicable) are obtained utilizing NASCET criteria, using the distal internal carotid diameter as the denominator. Multiphase CT imaging of the brain was performed following IV bolus contrast injection. Subsequent  parametric perfusion maps were calculated using RAPID software. CONTRAST:  100mL OMNIPAQUE IOHEXOL 350 MG/ML SOLN COMPARISON:  None. FINDINGS: CTA NECK FINDINGS Aortic arch: Great vessel origins are patent. Right carotid system: Patent. Minimal calcified plaque at the ICA origin without measurable stenosis. Left carotid system: Patent. Mild calcified and noncalcified plaque at the ICA origin causing minimal stenosis. Vertebral arteries: Patent and codominant. No stenosis or evidence of dissection. Skeleton: Mild degenerative changes of the cervical spine. Other neck: A lipoma is present in the right neck mildly displacing the submandibular gland. No adenopathy. Upper chest: No apical lung mass. Review of the MIP images confirms the above findings CTA HEAD FINDINGS Anterior circulation: Intracranial internal carotid arteries patent with mild calcified plaque. Anterior and middle cerebral arteries are patent. Left A1 ACA is dominant. Posterior circulation: Intracranial vertebral arteries, basilar artery, and posterior cerebral arteries are patent. A left posterior communicating artery is present. Venous sinuses: As permitted by contrast timing, patent. Review of the MIP images confirms the above findings CT Brain Perfusion Findings: CBF (<30%) Volume: 0mL. There is expected decreased CBF and CBV corresponding to the chronic infarcts on CT. Perfusion (Tmax>6.0s) volume: 0mL Mismatch Volume: 0mL Infarction Location: None IMPRESSION: No large vessel occlusion or hemodynamically significant stenosis. No evidence of territory at risk by perfusion imaging. Electronically Signed   By: Praneil  Patel M.D.   On: 10/04/2019 09:45   CT Code Stroke CTA Neck W/WO contrast  Result Date: 10/04/2019 CLINICAL DATA:  Code stroke, follow-up EXAM: CT ANGIOGRAPHY HEAD AND NECK CT PERFUSION BRAIN TECHNIQUE: Multidetector CT imaging of the head and neck was performed using the standard protocol during bolus administration of intravenous  contrast. Multiplanar CT image reconstructions and MIPs were obtained to evaluate the vascular anatomy. Carotid stenosis measurements (when applicable) are obtained utilizing NASCET criteria, using the distal internal carotid diameter as the denominator. Multiphase CT imaging of the brain was performed following IV bolus contrast injection. Subsequent parametric perfusion maps were calculated using RAPID software. CONTRAST:  100mL OMNIPAQUE IOHEXOL 350 MG/ML SOLN COMPARISON:  None. FINDINGS: CTA NECK FINDINGS Aortic arch: Great vessel origins are patent. Right carotid system: Patent. Minimal calcified plaque at the ICA origin without measurable stenosis. Left carotid system: Patent. Mild calcified and noncalcified plaque at the ICA origin causing minimal stenosis. Vertebral arteries: Patent and codominant. No stenosis or evidence of dissection. Skeleton: Mild degenerative changes of the cervical spine. Other neck: A lipoma is present in the right neck mildly displacing the submandibular gland. No adenopathy. Upper chest: No apical lung mass. Review of the MIP images confirms the above   findings CTA HEAD FINDINGS Anterior circulation: Intracranial internal carotid arteries patent with mild calcified plaque. Anterior and middle cerebral arteries are patent. Left A1 ACA is dominant. Posterior circulation: Intracranial vertebral arteries, basilar artery, and posterior cerebral arteries are patent. A left posterior communicating artery is present. Venous sinuses: As permitted by contrast timing, patent. Review of the MIP images confirms the above findings CT Brain Perfusion Findings: CBF (<30%) Volume: 0mL. There is expected decreased CBF and CBV corresponding to the chronic infarcts on CT. Perfusion (Tmax>6.0s) volume: 0mL Mismatch Volume: 0mL Infarction Location: None IMPRESSION: No large vessel occlusion or hemodynamically significant stenosis. No evidence of territory at risk by perfusion imaging. Electronically  Signed   By: Praneil  Patel M.D.   On: 10/04/2019 09:45   CT Code Stroke Cerebral Perfusion with contrast  Result Date: 10/04/2019 CLINICAL DATA:  Code stroke, follow-up EXAM: CT ANGIOGRAPHY HEAD AND NECK CT PERFUSION BRAIN TECHNIQUE: Multidetector CT imaging of the head and neck was performed using the standard protocol during bolus administration of intravenous contrast. Multiplanar CT image reconstructions and MIPs were obtained to evaluate the vascular anatomy. Carotid stenosis measurements (when applicable) are obtained utilizing NASCET criteria, using the distal internal carotid diameter as the denominator. Multiphase CT imaging of the brain was performed following IV bolus contrast injection. Subsequent parametric perfusion maps were calculated using RAPID software. CONTRAST:  100mL OMNIPAQUE IOHEXOL 350 MG/ML SOLN COMPARISON:  None. FINDINGS: CTA NECK FINDINGS Aortic arch: Great vessel origins are patent. Right carotid system: Patent. Minimal calcified plaque at the ICA origin without measurable stenosis. Left carotid system: Patent. Mild calcified and noncalcified plaque at the ICA origin causing minimal stenosis. Vertebral arteries: Patent and codominant. No stenosis or evidence of dissection. Skeleton: Mild degenerative changes of the cervical spine. Other neck: A lipoma is present in the right neck mildly displacing the submandibular gland. No adenopathy. Upper chest: No apical lung mass. Review of the MIP images confirms the above findings CTA HEAD FINDINGS Anterior circulation: Intracranial internal carotid arteries patent with mild calcified plaque. Anterior and middle cerebral arteries are patent. Left A1 ACA is dominant. Posterior circulation: Intracranial vertebral arteries, basilar artery, and posterior cerebral arteries are patent. A left posterior communicating artery is present. Venous sinuses: As permitted by contrast timing, patent. Review of the MIP images confirms the above findings CT  Brain Perfusion Findings: CBF (<30%) Volume: 0mL. There is expected decreased CBF and CBV corresponding to the chronic infarcts on CT. Perfusion (Tmax>6.0s) volume: 0mL Mismatch Volume: 0mL Infarction Location: None IMPRESSION: No large vessel occlusion or hemodynamically significant stenosis. No evidence of territory at risk by perfusion imaging. Electronically Signed   By: Praneil  Patel M.D.   On: 10/04/2019 09:45   DG Chest Portable 1 View  Result Date: 10/04/2019 CLINICAL DATA:  Weakness. EXAM: PORTABLE CHEST 1 VIEW COMPARISON:  06/09/2019 FINDINGS: Numerous leads and wires project over the chest. Patient rotated left. Midline trachea. Mild cardiomegaly. No pleural effusion or pneumothorax. Clear lungs. IMPRESSION: No acute cardiopulmonary disease. Electronically Signed   By: Kyle  Talbot M.D.   On: 10/04/2019 10:28   CT HEAD CODE STROKE WO CONTRAST  Result Date: 10/04/2019 CLINICAL DATA:  Code stroke.  Slurred speech. EXAM: CT HEAD WITHOUT CONTRAST TECHNIQUE: Contiguous axial images were obtained from the base of the skull through the vertex without intravenous contrast. COMPARISON:  07/14/2019 FINDINGS: Brain: A subdural hematoma laterally over the right cerebral convexity demonstrates increased density compared to the prior study though remains hypoattenuating relative to cortex and   has not significantly changed in size with a maximal thickness of 9 mm. No definite acute/hyperdense blood products are identified. Leftward midline shift measures 3 mm, unchanged. Chronic right occipital, left frontal, left temporal lobe, and left cerebellar infarcts are unchanged. Hypodensities elsewhere in the cerebral white matter bilaterally and in the pons are nonspecific but compatible with mild chronic small vessel ischemic disease. There is mild cerebral atrophy. Vascular: Calcified atherosclerosis at the skull base. No hyperdense vessel. Skull: No fracture suspicious osseous lesion. Sinuses/Orbits: Unchanged  left maxillary sinus mucous retention cyst. Clear mastoid air cells. Bilateral cataract extraction. Other: Unchanged 1.6 cm left suboccipital sebaceous cyst. ASPECTS ([place] Stroke Program Early CT Score) - Ganglionic level infarction (caudate, lentiform nuclei, internal capsule, insula, M1-M3 cortex): 7 - Supraganglionic infarction (M4-M6 cortex): 3 Total score (0-10 with 10 being normal): 10 IMPRESSION: 1. No acute infarct identified.  ASPECTS of 10. 2. Chronic subdural hematoma over the right cerebral convexity, mildly increased in density compared to the prior CT though unchanged in size and without definite acute hemorrhage. Unchanged 3 mm of leftward midline shift. 3. Chronic bilateral cerebral and left cerebellar infarcts. These results were communicated to Dr. Arora at 9:21 am on 10/04/2019 by text page via the AMION messaging system. Electronically Signed   By: Allen  Grady M.D.   On: 10/04/2019 09:22    Medications:   . acyclovir  400 mg Oral QHS  . calcitRIOL  0.25 mcg Oral Q M,W,F  . Chlorhexidine Gluconate Cloth  6 each Topical Q0600  . clonazePAM  1 mg Oral BID  . fluticasone furoate-vilanterol  1 puff Inhalation Daily  . heparin  5,000 Units Subcutaneous Q8H  . ketorolac  1 drop Both Eyes QID  . lacosamide  200 mg Oral BID  . lacosamide  200 mg Oral Q M,W,F-1800  . lamoTRIgine  150 mg Oral BID  . loratadine  10 mg Oral Daily  . metoprolol tartrate  12.5 mg Oral BID  . multivitamin  1 tablet Oral QHS  . ofloxacin  1 drop Left Eye QID  . prednisoLONE acetate  1 drop Left Eye TID   Continuous Infusions:   LOS: 0 days    U   [facility]   How to contact the [facility] Attending or Consulting provider 7A - 7P or covering provider during after hours 7P -7A, for this patient?  1. Check the care team in [facility] and look for a) attending/consulting [facility] provider listed and b) the [facility] team listed 2. Log into [email or web address] and use [place]'s universal password to access.  If you do not have the password, please contact the hospital operator. 3. Locate the [facility] provider you are looking for under [facility] and page to a number that you can be directly reached. 4. If you still have difficulty reaching the provider, please page the DOC (Director on Call) for the Hospitalists listed on amion for assistance.  10/05/2019, 9:53 AM    

## 2019-10-05 NOTE — Evaluation (Signed)
Clinical/Bedside Swallow Evaluation Patient Details  Name: Isaiah [name] [facility] MRN: [id number] Date of Birth: 10/08/1965  Today's Date: 10/05/2019 Time: SLP Start Time (ACUTE ONLY): 12 SLP Stop Time (ACUTE ONLY): 0945 SLP Time Calculation (min) (ACUTE ONLY): 25 min  Past Medical History:  Past Medical History:  Diagnosis Date  . A-fib ([place])   . Anemia   . Asthma   . DM type 2 (diabetes mellitus, type 2) ([place]) 06/09/2019  . ESRD (end stage renal disease) on dialysis ([place])   . Essential hypertension 06/09/2019  . GIB (gastrointestinal bleeding)    Recurrent episodes- 09/2014, 09/2015 and 05/2016  . Gout   . History of recent blood transfusion 10/27/14   2 Units PRBC's  . Hyperkalemia 07/2011  . Multiple myeloma ([place])   . OSA on CPAP   . Pulmonary embolism ([place]) 07/2011; 09/27/2014   a. Bilat PE 07/2011 - unclear cause, tx with 6 months Coumadin.;   . Seizure disorder ([place]) 06/09/2019  . Sepsis ([place])   . Sickle cell-thalassemia disease ([place])    a. Sickle cell trait.  . Sleep apnea   . Stroke ([place]) 09/2015   R-MCA, L-MCA, PCA and bilateral cerebellar complicated by DVT/PE  . Subdural hematoma ([place]) 05/2019   Past Surgical History:  Past Surgical History:  Procedure Laterality Date  . AV FISTULA PLACEMENT Right 12/05/2015   Procedure: INSERTION OF ARTERIOVENOUS (AV) GORE-TEX GRAFT ARM;  Surgeon: Elam Dutch, MD;  Location: [place];  Service: Vascular;  Laterality: Right;  . BONE MARROW BIOPSY    . CHOLECYSTECTOMY    . CHOLECYSTECTOMY  1990's?  . COLONOSCOPY WITH PROPOFOL N/A 01/22/2017   Procedure: COLONOSCOPY WITH PROPOFOL;  Surgeon: Wilford Corner, MD;  Location: [facility];  Service: Endoscopy;  Laterality: N/A;  . EYE SURGERY Right   . INSERTION OF DIALYSIS CATHETER Right 10/16/2015   Procedure: INSERTION OF PALINDROME DIALYSIS CATHETER ;  Surgeon: Elam Dutch, MD;  Location: [place];  Service: Vascular;  Laterality: Right;  . OTHER SURGICAL HISTORY     Retinal surgery   . PARS PLANA VITRECTOMY  02/17/2012   Procedure: PARS PLANA VITRECTOMY WITH 25 GAUGE;  Surgeon: Hayden Pedro, MD;  Location: [place];  Service: Ophthalmology;  Laterality: Right;  . PERIPHERAL VASCULAR CATHETERIZATION N/A 03/20/2016   Procedure: A/V Shuntogram/Fistulagram;  Surgeon: Conrad Four Bridges, MD;  Location: [place] CV LAB;  Service: Cardiovascular;  Laterality: N/A;  . TEE WITHOUT CARDIOVERSION N/A 12/02/2016   Procedure: TRANSESOPHAGEAL ECHOCARDIOGRAM (TEE);  Surgeon: Larey Dresser, MD;  Location: [facility];  Service: Cardiovascular;  Laterality: N/A;   HPI:  53yo male admitted 10/04/19 with PMH: PCA, bilateral MCA/cerebellar CVA (09/2015), OSA on CPAP, seizures, SDH (05/2019), PE, multiple myeloma, HTN, ESRD on HD MWF, DM, AFib, asthma, CIR 10/6-22/2020.   Assessment / Plan / Recommendation Clinical Impression  Pt presents with baseline left orofacial weakness. Speech is fully intelligible, however. Pt reports tolerance of regular solids and thin liquids at home. Pt was observed with thin liquid, puree, and solid textures. No obvious oral issues or overt s/s aspiration on any consistency. Recommend regular solids and thin liquids, meds whole. No further ST intervention recommended at this time, as pt appears to be at baseline level of function. SLP Visit Diagnosis: Dysphagia, unspecified (R13.10)    Aspiration Risk  Mild aspiration risk    Diet Recommendation Regular;Thin liquid   Liquid Administration via: Cup;Straw Medication Administration: Whole meds with liquid Supervision: Patient able to self feed;Staff to  assist with self feeding;Intermittent supervision to cue for compensatory strategies Compensations: Slow rate;Small sips/bites Postural Changes: Seated upright at 90 degrees    Other  Recommendations Oral Care Recommendations: Oral care BID Other Recommendations: Clarify dietary restrictions   Follow up Recommendations None          Prognosis Prognosis for Safe  Diet Advancement: Good      Swallow Study   General Date of Onset: 10/04/19 HPI: 54yo male admitted 10/04/19 with PMH: PCA, bilateral MCA/cerebellar CVA (09/2015), OSA on CPAP, seizures, SDH (05/2019), PE, multiple myeloma, HTN, ESRD on HD MWF, DM, AFib, asthma, CIR 10/6-22/2020. Type of Study: Bedside Swallow Evaluation Previous Swallow Assessment: pt seen on CIR, but not for dysphagia Diet Prior to this Study: NPO Temperature Spikes Noted: No Respiratory Status: Room air Behavior/Cognition: Alert;Cooperative;Pleasant mood Oral Cavity Assessment: Within Functional Limits Oral Care Completed by SLP: No Oral Cavity - Dentition: Adequate natural dentition Vision: Functional for self-feeding Self-Feeding Abilities: Able to feed self;Needs set up(RUE weakness) Patient Positioning: Upright in bed Baseline Vocal Quality: Normal Volitional Cough: Strong Volitional Swallow: Able to elicit    Oral/Motor/Sensory Function Overall Oral Motor/Sensory Function: Mild impairment Facial ROM: Reduced right Facial Symmetry: Abnormal symmetry right Facial Strength: Reduced right Lingual ROM: Within Functional Limits Lingual Symmetry: Within Functional Limits Lingual Strength: Within Functional Limits Velum: Within Functional Limits Mandible: Within Functional Limits   Ice Chips Ice chips: Not tested   Thin Liquid Thin Liquid: Within functional limits Presentation: Straw    Nectar Thick Nectar Thick Liquid: Not tested   Honey Thick Honey Thick Liquid: Not tested   Puree Puree: Within functional limits Presentation: Self Fed;Spoon   Solid     Solid: Within functional limits Presentation: [place] [facility] Qu[place], [facility], [place] Speech Language Pathologist Office: [phone] Pager: [phone]   Shonna Chock 10/05/2019,9:48 AM

## 2019-10-05 NOTE — Telephone Encounter (Signed)
Discussed with wife, some confusion regarding attending physician.

## 2019-10-05 NOTE — Telephone Encounter (Signed)
Wilfred Curtis from [facility] called to see if Dr. Delice Lesch will sign orders for home health services.

## 2019-10-05 NOTE — Progress Notes (Signed)
EEG complete - results pending 

## 2019-10-05 NOTE — Telephone Encounter (Signed)
Isaiah [name] calling regarding her husband request a call back  [phone] from Dr Posey Pronto

## 2019-10-05 NOTE — Progress Notes (Addendum)
Neurology Progress Note   S:// Patient seen and examined.  Reports feeling better than yesterday.  No headaches.   O:// Current vital signs: BP 134/65 (BP Location: Left Arm)   Pulse 75   Temp 97.7 F (36.5 C) (Oral)   Resp 18   Ht 5' 10"  (1.778 m)   Wt 82.4 kg   SpO2 100%   BMI 26.07 kg/m  Vital signs in last 24 hours: Temp:  [97.6 F (36.4 C)-98.1 F (36.7 C)] 97.7 F (36.5 C) (01/27 0536) Pulse Rate:  [73-87] 75 (01/27 0536) Resp:  [14-21] 18 (01/27 0536) BP: (82-157)/(33-122) 134/65 (01/27 0536) SpO2:  [90 %-100 %] 100 % (01/27 0536) Weight:  [82.4 kg-84.3 kg] 82.4 kg (01/26 1821) General: Awake alert in no distress HEENT: [place] atraumatic Neck: Clear Cardiovascular: Regular rate rhythm Abdomen: Soft nondistended nontender Extremities warm well perfused with trace edema Neurological exam Awake alert oriented to self and place. Speech is mildly dysarthric No aphasia, but poor attention concentration. Cranial: Pupils equal round react to light, extraocular movements intact, appears to have full visual fields, right lower facial weakness, tongue and palate midline. Motor exam: Minimal vertical drift slightly more prominent in the right upper extremity with 4/5 strength in the right upper extremity.  Left upper extremity 5/5.  Both lower extremities antigravity. Sensory exam: Intact to light touch Coordination: No asterixis seen, no dysmetria.   Medications  Current Facility-Administered Medications:  .  acetaminophen (TYLENOL) tablet 650 mg, 650 mg, Oral, Q6H PRN **OR** acetaminophen (TYLENOL) suppository 650 mg, 650 mg, Rectal, Q6H PRN, Karmen Bongo, MD .  acyclovir (ZOVIRAX) tablet 400 mg, 400 mg, Oral, QHS, Karmen Bongo, MD, 400 mg at 10/04/19 2153 .  albuterol (PROVENTIL) (2.5 MG/3ML) 0.083% nebulizer solution 3 mL, 3 mL, Inhalation, Q4H PRN, Karmen Bongo, MD .  calcium carbonate (dosed in mg elemental calcium) suspension 500 mg of elemental calcium,  500 mg of elemental calcium, Oral, Q6H PRN, Karmen Bongo, MD .  camphor-menthol [facility]) lotion 1 application, 1 application, Topical, S9G PRN **AND** hydrOXYzine (ATARAX/VISTARIL) tablet 25 mg, 25 mg, Oral, Q8H PRN, Karmen Bongo, MD .  Chlorhexidine Gluconate Cloth 2 % PADS 6 each, 6 each, Topical, Q0600, Karmen Bongo, MD, 6 each at 10/05/19 0503 .  clonazePAM (KLONOPIN) tablet 1 mg, 1 mg, Oral, BID, Karmen Bongo, MD, 1 mg at 10/04/19 2154 .  docusate sodium (ENEMEEZ) enema 283 mg, 1 enema, Rectal, PRN, Karmen Bongo, MD .  feeding supplement (NEPRO CARB STEADY) liquid 237 mL, 237 mL, Oral, TID PRN, Karmen Bongo, MD .  fluticasone furoate-vilanterol (BREO ELLIPTA) 200-25 MCG/INH 1 puff, 1 puff, Inhalation, Daily, Karmen Bongo, MD .  heparin injection 5,000 Units, 5,000 Units, Subcutaneous, Q8H, Karmen Bongo, MD, 5,000 Units at 10/04/19 2154 .  ketorolac (ACULAR) 0.5 % ophthalmic solution 1 drop, 1 drop, Both Eyes, QID, Karmen Bongo, MD, 1 drop at 10/04/19 2153 .  lacosamide (VIMPAT) tablet 200 mg, 200 mg, Oral, BID, Karmen Bongo, MD, 200 mg at 10/04/19 2154 .  lacosamide (VIMPAT) tablet 200 mg, 200 mg, Oral, Q M,W,F-1800, Karmen Bongo, MD .  lamoTRIgine (LAMICTAL) tablet 150 mg, 150 mg, Oral, BID, Karmen Bongo, MD, 150 mg at 10/04/19 2155 .  loratadine (CLARITIN) tablet 10 mg, 10 mg, Oral, Daily, Karmen Bongo, MD .  metoprolol tartrate (LOPRESSOR) tablet 12.5 mg, 12.5 mg, Oral, BID, Karmen Bongo, MD .  multivitamin (RENA-VIT) tablet 1 tablet, 1 tablet, Oral, Ivery Quale, MD, 1 tablet at 10/04/19 2153 .  ofloxacin (OCUFLOX)  0.3 % ophthalmic solution 1 drop, 1 drop, Left Eye, QID, Karmen Bongo, MD .  ondansetron [facility]) tablet 4 mg, 4 mg, Oral, Q6H PRN **OR** ondansetron (ZOFRAN) injection 4 mg, 4 mg, Intravenous, Q6H PRN, Karmen Bongo, MD .  prednisoLONE acetate (PRED FORTE) 1 % ophthalmic suspension 1 drop, 1 drop, Left Eye, TID, Karmen Bongo, MD, 1 drop at 10/04/19 2151 .  sorbitol 70 % solution 30 mL, 30 mL, Oral, PRN, Karmen Bongo, MD .  zolpidem (AMBIEN) tablet 5 mg, 5 mg, Oral, QHS PRN, Karmen Bongo, MD Labs CBC    Component Value Date/Time   WBC 7.2 10/04/2019 0900   RBC Unable to determine due to a cold agglutinin 10/04/2019 0900   HGB 10.2 (L) 10/04/2019 0915   HGB 10.8 (L) 09/27/2019 1016   HGB 10.9 (L) 07/03/2017 0813   HCT 30.0 (L) 10/04/2019 0915   HCT 30.4 (L) 07/03/2017 0813   PLT 265 10/04/2019 0900   PLT 219 09/27/2019 1016   PLT 355 07/03/2017 0813   MCV Unable to determine due to a cold agglutinin 10/04/2019 0900   MCV 85.9 07/03/2017 0813   MCH 34.7 (H) 10/04/2019 0900   MCHC Unable to determine due to a cold agglutinin 10/04/2019 0900   RDW Unable to determine due to a cold agglutinin 10/04/2019 0900   RDW 17.6 (H) 07/03/2017 0813   LYMPHSABS 0.0 (L) 10/04/2019 0900   LYMPHSABS 2.3 07/03/2017 0813   MONOABS 0.9 10/04/2019 0900   MONOABS 1.2 (H) 07/03/2017 0813   EOSABS 0.4 10/04/2019 0900   EOSABS 0.3 07/03/2017 0813   BASOSABS 0.1 10/04/2019 0900   BASOSABS 0.1 07/03/2017 0813    CMP     Component Value Date/Time   NA 136 10/05/2019 0533   NA 138 07/03/2017 0813   K 4.5 10/05/2019 0533   K 4.2 07/03/2017 0813   CL 96 (L) 10/05/2019 0533   CO2 27 10/05/2019 0533   CO2 27 07/03/2017 0813   GLUCOSE 83 10/05/2019 0533   GLUCOSE 76 07/03/2017 0813   BUN 32 (H) 10/05/2019 0533   BUN 44 (A) 08/17/2019 0000   BUN 46.4 (H) 07/03/2017 0813   CREATININE 9.34 (H) 10/05/2019 0533   CREATININE 8.81 (HH) 09/27/2019 1016   CREATININE 10.9 (HH) 07/03/2017 0813   CALCIUM 8.4 (L) 10/05/2019 0533   CALCIUM 9.3 07/03/2017 0813   PROT 6.5 10/04/2019 0900   PROT 10.6 (H) 07/03/2017 0813   PROT 9.6 (H) 07/03/2017 0813   ALBUMIN 4.0 10/04/2019 0900   ALBUMIN 4.0 07/03/2017 0813   AST 23 10/04/2019 0900   AST 13 (L) 09/27/2019 1016   AST 16 07/03/2017 0813   ALT 13 10/04/2019 0900    ALT 7 09/27/2019 1016   ALT 14 07/03/2017 0813   ALKPHOS 83 10/04/2019 0900   ALKPHOS 77 07/03/2017 0813   BILITOT 1.2 10/04/2019 0900   BILITOT 0.9 09/27/2019 1016   BILITOT 0.93 07/03/2017 0813   GFRNONAA 6 (L) 10/05/2019 0533   GFRNONAA 6 (L) 09/27/2019 1016   GFRAA 7 (L) 10/05/2019 0533   GFRAA 7 (L) 09/27/2019 1016  Creatinine improved from yesterday. Cultures have been unremarkable so far  Imaging I have reviewed images in epic and the results pertinent to this consultation are: CT head unremarkable for acute process.  Stable subdural.  Assessment: 54 year old with past medical history significant for left MCA stroke with residual right hemiparesis, right subdural hematoma and seizure disorder, A. fib, DVT and PE off of Coumadin  due to GI bleed,ESRD seen for increased dysarthria and altered mental status. Noted to have significantly increased BUN/creatinine due to missed dialysis.  Examination on arrival revealed poor attention concentration consistent with encephalopathy as well as asterixis along with some right hemiparesis.  Also seen was right facial droop and dysarthria. Symptoms improved with overnight dialysis.  No asterixis.  Continues to have mild right hemiparesis and right facial droop along with mild dysarthria.  Overall presentation consistent with toxic metabolic encephalopathy but will work him up to rule out seizures.  Do not think he has had any stroke but if he worsens, will consider an MRI at that time.  Impression: Multifactorial toxic metabolic encephalopathy. Evaluate for underlying seizures.  Recommendations: Continue to manage toxic metabolic derangements per primary team as you are. I will follow up on the EEG.  It is being done at this time and will be read shortly. For a stroke prevention standpoint, it is a very hard decision about anticoagulation given his history of atrial fibrillation as well as a subdural hematoma.  I would defer this to his  outpatient primary care team as well as neurology.  Due to recent GI bleed, would not recommend a just about yet.  -- Amie Portland, MD [facility] Pager: [phone] If 7pm to 7am, please call on call as listed on AMION.

## 2019-10-06 DIAGNOSIS — I6203 Nontraumatic chronic subdural hemorrhage: Secondary | ICD-10-CM

## 2019-10-06 NOTE — Progress Notes (Signed)
[facility] Progress Note   Subjective:   Patient seen and examined at bedside.  Reports he is feeling well. Denies SOB, CP, n/v/d, dizziness, weakness and fatigue.  Oriented to person, place and month, answered 2010 for year.   Objective Vitals:   10/05/19 1747 10/05/19 2145 10/06/19 0506 10/06/19 0838  BP: 117/79 97/64 105/63   Pulse: 70 69 72 70  Resp: 18 18 18 18   Temp: 98 F (36.7 C) 98.3 F (36.8 C) 98.7 F (37.1 C)   TempSrc: Oral Oral Oral   SpO2: 98% 98% 99% 98%  Weight:      Height:       Physical Exam General:NAD, Chronically ill appearing male, laying in bed Heart:RRR Lungs:CTAB Abdomen:soft, NTND Extremities:no edema Dialysis Access: RU AVF +b   Filed Weights   10/04/19 1821 10/05/19 1200 10/05/19 1552  Weight: 82.4 kg 82.1 kg 81.2 kg    Intake/Output Summary (Last 24 hours) at 10/06/2019 0945 Last data filed at 10/06/2019 0300 Gross per 24 hour  Intake 240 ml  Output 153 ml  Net 87 ml    Additional Objective Labs: Basic Metabolic Panel: Recent Labs  Lab 10/04/19 0900 10/04/19 0915 10/05/19 0533  NA 135 134* 136  K 4.9 4.9 4.5  CL 92* 95* 96*  CO2 26  --  27  GLUCOSE 168* 159* 83  BUN 60* 71* 32*  CREATININE 14.87* 15.30* 9.34*  CALCIUM 9.4  --  8.4*  PHOS  --   --  3.2   Liver Function Tests: Recent Labs  Lab 10/04/19 0900  AST 23  ALT 13  ALKPHOS 83  BILITOT 1.2  PROT 6.5  ALBUMIN 4.0   No results for input(s): LIPASE, AMYLASE in the last 168 hours. CBC: Recent Labs  Lab 10/04/19 0900 10/04/19 0900 10/04/19 0915 10/05/19 0533 10/05/19 1230  WBC 7.2  --   --  7.9 8.5  NEUTROABS 5.8  --   --   --   --   HGB Unable to determine due to a cold agglutinin   < > 10.2* 10.3* 10.1*  HCT Unable to determine due to a cold agglutinin   < > 30.0* Unable to determine due to a cold agglutinin RESULTS UNAVAILABLE DUE TO INTERFERING SUBSTANCE  MCV Unable to determine due to a cold agglutinin  --   --  Unable to determine  due to a cold agglutinin RESULTS UNAVAILABLE DUE TO INTERFERING SUBSTANCE  PLT 265  --   --  239 278   < > = values in this interval not displayed.   Blood Culture    Component Value Date/Time   SDES BLOOD BLOOD LEFT HAND 10/04/2019 0947   SPECREQUEST  10/04/2019 0947    BOTTLES DRAWN AEROBIC AND ANAEROBIC Blood Culture results may not be optimal due to an inadequate volume of blood received in culture bottles   CULT  10/04/2019 0947    NO GROWTH 1 DAY Performed at [facility], [place] [address] [place], [place] [address]    REPTSTATUS PENDING 10/04/2019 8916    Cardiac Enzymes: No results for input(s): CKTOTAL, CKMB, CKMBINDEX, TROPONINI in the last 168 hours. CBG: Recent Labs  Lab 10/04/19 0900  GLUCAP 130*   Iron Studies: No results for input(s): IRON, TIBC, TRANSFERRIN, FERRITIN in the last 72 hours. Lab Results  Component Value Date   INR 1.0 10/04/2019   INR 1.2 06/09/2019   INR 1.13 11/13/2016   PROTIME 16.8 (H) 10/30/2014  Studies/Results: EEG  Result Date: 10/05/2019 Lora Havens, MD     10/05/2019  9:25 AM Patient Name: Isaiah [name] [facility]: Lambert Rhonda W MRN: [id number] Epilepsy Attending: Lora Havens Referring Physician/Provider: Dr Amie Portland Date: 10/05/2019 Duration: 25.21 mins Patient history:  54 year old with history significant for left MCA stroke with residual right hemiparesis, right subdural hematoma and seizure disorder seen for increased dysarthria and altered mental status. Level of alertness: awake, drowsy AEDs during EEG study: clonazepam, vimpat, lamotrigine Technical aspects: This EEG study was done with scalp electrodes positioned according to the 10-20 International system of electrode placement. Electrical activity was acquired at a sampling rate of 500Hz  and reviewed with a high frequency filter of 70Hz  and a low frequency filter of 1Hz . EEG data were recorded continuously and digitally stored. DESCRIPTION: The posterior dominant rhythm consists of 7 Hz  activity of moderate voltage (25-35 uV) seen predominantly in posterior head regions, symmetric and reactive to eye opening and eye closing. Drowsiness was characterized by attenuation of the posterior background rhythm. EEG showed intermittent 2-3Hz  delta slowing in left temporal slowing. Hyperventilation and photic stimulation were not performed. ABNORMALITY - Intermittent slow, left temporal region IMPRESSION: This study is suggestive of non specific cortical dysfunction in left temporal region.  No seizures or epileptiform discharges were seen throughout the recording. Lora Havens   DG Chest Portable 1 View  Result Date: 10/04/2019 CLINICAL DATA:  Weakness. EXAM: PORTABLE CHEST 1 VIEW COMPARISON:  06/09/2019 FINDINGS: Numerous leads and wires project over the chest. Patient rotated left. Midline trachea. Mild cardiomegaly. No pleural effusion or pneumothorax. Clear lungs. IMPRESSION: No acute cardiopulmonary disease. Electronically Signed   By: Abigail Miyamoto M.D.   On: 10/04/2019 10:28    Medications: . sodium chloride    . sodium chloride     . acyclovir  400 mg Oral QHS  . calcitRIOL  0.25 mcg Oral Q M,W,F  . Chlorhexidine Gluconate Cloth  6 each Topical Q0600  . clonazePAM  1 mg Oral BID  . fluticasone furoate-vilanterol  1 puff Inhalation Daily  . heparin  5,000 Units Subcutaneous Q8H  . ketorolac  1 drop Both Eyes QID  . lacosamide  200 mg Oral BID  . lacosamide  200 mg Oral Q M,W,F-1800  . lamoTRIgine  150 mg Oral BID  . loratadine  10 mg Oral Daily  . metoprolol tartrate  12.5 mg Oral BID  . multivitamin  1 tablet Oral QHS  . ofloxacin  1 drop Left Eye QID  . prednisoLONE acetate  1 drop Left Eye TID    Dialysis Orders: MWF -[facility] 4hrs, [id number], [id number], [id number]  Access:RU AVF Heparinnone Mircera7mg q2wks - last 09/28/19 Venofer 536mqwk - last 09/28/19 Calcitriol0.2575mPO qHD  Assessment/Plan: 1. AMS - per neuro likely metabolic encephalopathy  related to uremia due to missing HD yesterday.  Confusion improving.  EEG showed non specific cortical dysfunction in L temporal region, likely related to past stroke.  No seizures.  Per neuro/primary. 2. Acute on chronic SDH -worsened neurologic sx over last few days, now improving. CT shows no acute changes. Per neuro. 3. ESRD- On HD MWF. HD tolerated well.  Orders written for HD tomorrow if remains inpatient.   4. Hypertension/volume- Blood pressure well controlled. Goal 140 in setting SDH. On Norvasc and Lopressor. Does not appear volume overload, titrate down as tolerated.  5. Anemiaof CKD- Hgb 10.1. ESA recently dosed. Follow trends. 6. Secondary Hyperparathyroidism -Ca and phos at goal. Not on binders. Continue VDRA.  7. Nutrition- Renal diet w/fluid restrictions.  8. Multiple myeloma- followed by oncology 9. Recent aspiration PNA - on Cipro per primary 10. Seizure d/o- followed closely as OP. Continue vimpat, lamictal. EEG today showed no seizure activity.  11. A fib- not on anticoag d/t Hx GIB 12. DMT2 13. Hx Proteus bacteremia - rechecking blood cultures - NGTD  Jen Mow, PA-C [facility] Pager: [phone] 10/06/2019,9:45 AM  LOS: 1 day

## 2019-10-06 NOTE — Consult Note (Signed)
   [facility] Inpatient Consult   10/06/2019  [place] [birth year] [id number]   [facility] [[facility]] status: pending  Patient screened for extreme high risk score for unplanned readmission score  and for 3 hospitalizations  In the past 6 months. Review of patient's medical record reveals patient has Primary Care Provider is [name], MD at [facility], this is a [name] Embedded practice and this practice provides the transition of care follow up.  Plan:  Following for progress and disposition needs.  Will update [facility] Embedded RN Chronic Care Manager of disposition.  Continue to follow progress and disposition to assess for post hospital care management needs.    Please place a [facility] Care Management consult as appropriate and for questions contact:   Natividad Brood, RN BSN [facility] Liaison  [phone] business mobile phone Toll free office [phone]  Fax number: [phone] [email or web address]

## 2019-10-06 NOTE — Progress Notes (Addendum)
Progress Note    Isaiah [name] [facility]  VBT:[id number] DOB: 1966/01/13  DOA: 10/04/2019 PCP: [name], MD    Brief Narrative:     Medical records reviewed and are as summarized below:  Isaiah [name] is an 54 y.o. male with multiple medical issues.  It appears patient missed hemodialysis on Monday due to questionable vomiting.  Patient had impatient hemodialysis  with some improvement in symptoms but not resolution.  Assessment/Plan:   Principal Problem:   Acute metabolic encephalopathy Active Problems:   ESRD (end stage renal disease) on dialysis (HCC)   Benign essential HTN   Controlled type 2 diabetes mellitus with diabetic nephropathy, without long-term current use of insulin (HCC)   Hemiparesis affecting right side as late effect of cerebrovascular accident ([place])   Partial idiopathic epilepsy with seizures of localized onset, intractable, without status epilepticus ([place])   Chronic intracranial subdural hematoma (HCC)   Multiple myeloma not having achieved remission (HCC)   Atrial fibrillation, chronic (HCC)   COPD (chronic obstructive pulmonary disease) (HCC)   AMS (altered mental status)   AMS - Metabolic encepahlopathy from missed HD, likely cause -Patient presenting with encephalopathy as evidenced by his confusion, difficulty answering questions -Initial concern was for CVA but further evaluation makes it appears that this is uremia causing a metabolic encephalopathy - likely associated with missing HD prior to his presentation.  -Has had hypotension at home with SBP in the 80s to 60s range every evening on his HD days and has "episodes" of not being able to move or answer his wife during those times. Per his wife, she had not share this information with another physician until today.  -Patient on chronic MWF HD -Nephrology consult with HD on 1/26 -Continue renavite -Continue Klonopin -Patient with improvement in mentation but BP soft today.  -Could consider  midodrine.  -Discontinued metoprolol--he was taking this on non-HD days only with SBP in the 120s range as the highest readings at home.  -Could consider starting midodrine on HD days, discussed with Nephrology. They want to monitor for now. Patient will have HF tomorrow again.  -EEG done with intermittent slow in the left temporal region: Questionable related to old CVAs -PT eval requested to be sure patient is returning to his prior home status   Chronic SDH -Patient withprior admission for acute on chronic R-sided SDH -Given his encephalopathy on presentation, there was concern that this could be related to acute CVA -Neurology evaluated and instead thought this was more likely related to uremia -Has impaired mobility at baseline per chart review  Multiple myeloma -Patient had been previously followed for MGUS but bone marrow biopsy in 2018 showed almost 20% plasma cell infiltration -Negative skeletal survey in 2/19 -Treatment was deferred in 4/19 due to lack of symptoms -8/19 bone marrow biopsy with 25% abnormal plasma cells, FISH with IgG kappa -Skeletal survey remained negative -He was recently admitted to [facility] for autologous stem cell transplant, but this was delayed due to other medical problems that occurred while hospitalized -He is currently receiving weekly treatment with Darzalex Faspro with Decadron weekly; will hold for right now given acute presentation  Seizure d/o -Started after patient's CVA -Continue Vimpat, Lamictal  Afib -Reported h/o afib and PFO -IVC filter is in place -He is not a candidate for [facility] due to multiple falls and subdural hematoma -Rate control with metoprolol  DM -He appears to be only on SSI -Recent A1c was 5.1 -He does not appear to need treatment  for this at this time  HTN -Continue Lopressor -Norvasc is on hold, as per wife  COPD -Continue Breo Ellipta -Continue Albuterol HFA prn  L eye infection -Continue Acular,  ofloxacin drops, and Pred Forte   Family Communication/Anticipated D/C date and plan/Code Status   DVT prophylaxis: Heparin Code Status: Full Code.  Family Communication: Discussed with his wife at the bedside Disposition Plan: DC to home with [facility] PT once BP stable. Discussed possible need for midodrine with Nephrology, they will continue monitoring for now. Patient to have HD tomorrow. May be ready for dc tomorrow if BP and mentation stable after HD.    Medical Consultants:    Renal  Neurology  Subjective:  Patient with on complaints. He reports feeling better today but still has moments of confusion.   Objective:    Vitals:   10/05/19 2145 10/06/19 0506 10/06/19 0838 10/06/19 1154  BP: 97/64 105/63  (!) 97/49  Pulse: 69 72 70 82  Resp: _0 Temp: 98.3 F (36.8 C) 98.7 F (37.1 C)    TempSrc: Oral Oral  Oral  SpO2: 98% 99% 98% 98%  Weight:      Height:        Intake/Output Summary (Last 24 hours) at 10/06/2019 1442 Last data filed at 10/06/2019 0300 Gross per 24 hour  Intake 240 ml  Output 153 ml  Net 87 ml   Filed Weights   10/04/19 1821 10/05/19 1200 10/05/19 1552  Weight: 82.4 kg 82.1 kg 81.2 kg    Exam: Gen: Sitting in bed, in NAD Cardiac: RRR, S1 and S2 present Pulm: No respiratory distress, no wheezing GI: Soft, NT, ND Neuro: Alert, oriented x2 (did not know the date). Following commands.  Psych: Appropriate mood and affect.   Data Reviewed:   I have personally reviewed following labs and imaging studies:  Labs: Labs show the following:   Basic Metabolic Panel: Recent Labs  Lab 10/04/19 0900 10/04/19 0900 10/04/19 0915 10/05/19 0533  NA 135  --  134* 136  K 4.9   < > 4.9 4.5  CL 92*  --  95* 96*  CO2 26  --   --  27  GLUCOSE 168*  --  159* 83  BUN 60*  --  71* 32*  CREATININE 14.87*  --  15.30* 9.34*  CALCIUM 9.4  --   --  8.4*  PHOS  --   --   --  3.2   < > = values in this interval not displayed.   GFR Estimated  Creatinine Clearance: 9.2 mL/min (A) (by C-G formula based on SCr of 9.34 mg/dL (H)). Liver Function Tests: Recent Labs  Lab 10/04/19 0900  AST 23  ALT 13  ALKPHOS 83  BILITOT 1.2  PROT 6.5  ALBUMIN 4.0   No results for input(s): LIPASE, AMYLASE in the last 168 hours. No results for input(s): AMMONIA in the last 168 hours. Coagulation profile Recent Labs  Lab 10/04/19 0900  INR 1.0    CBC: Recent Labs  Lab 10/04/19 0900 10/04/19 0915 10/05/19 0533 10/05/19 1230  WBC 7.2  --  7.9 8.5  NEUTROABS 5.8  --   --   --   HGB Unable to determine due to a cold agglutinin 10.2* 10.3* 10.1*  HCT Unable to determine due to a cold agglutinin 30.0* Unable to determine due to a cold agglutinin RESULTS UNAVAILABLE DUE TO INTERFERING SUBSTANCE  MCV Unable to determine due to a cold agglutinin  --  Unable to determine due to a cold agglutinin RESULTS UNAVAILABLE DUE TO INTERFERING SUBSTANCE  PLT 265  --  239 278   Cardiac Enzymes: No results for input(s): CKTOTAL, CKMB, CKMBINDEX, TROPONINI in the last 168 hours. BNP (last 3 results) No results for input(s): PROBNP in the last 8760 hours. CBG: Recent Labs  Lab 10/04/19 0900  GLUCAP 130*   D-Dimer: No results for input(s): DDIMER in the last 72 hours. Hgb A1c: No results for input(s): HGBA1C in the last 72 hours. Lipid Profile: No results for input(s): CHOL, HDL, LDLCALC, TRIG, CHOLHDL, LDLDIRECT in the last 72 hours. Thyroid function studies: No results for input(s): TSH, T4TOTAL, T3FREE, THYROIDAB in the last 72 hours.  Invalid input(s): FREET3 Anemia work up: No results for input(s): VITAMINB12, FOLATE, FERRITIN, TIBC, IRON, RETICCTPCT in the last 72 hours. Sepsis Labs: Recent Labs  Lab 10/04/19 0900 10/05/19 0533 10/05/19 1230  WBC 7.2 7.9 8.5    Microbiology Recent Results (from the past 240 hour(s))  Culture, blood (routine x 2)     Status: None (Preliminary result)   Collection Time: 10/04/19  9:42 AM    Specimen: BLOOD  Result Value Ref Range Status   Specimen Description BLOOD BLOOD LEFT HAND  Final   Special Requests   Final    BOTTLES DRAWN AEROBIC AND ANAEROBIC Blood Culture adequate volume   Culture   Final    NO GROWTH 2 DAYS Performed at [facility], [address], [place], [place] [address]    Report Status PENDING  Incomplete  Culture, blood (routine x 2)     Status: None (Preliminary result)   Collection Time: 10/04/19  9:47 AM   Specimen: BLOOD  Result Value Ref Range Status   Specimen Description BLOOD BLOOD LEFT HAND  Final   Special Requests   Final    BOTTLES DRAWN AEROBIC AND ANAEROBIC Blood Culture results may not be optimal due to an inadequate volume of blood received in culture bottles   Culture   Final    NO GROWTH 2 DAYS Performed at [facility], [place] [address], [place], [place] [address]    Report Status PENDING  Incomplete  Respiratory Panel by RT PCR (Flu A&B, Covid) - Nasopharyngeal Swab     Status: None   Collection Time: 10/04/19 10:16 AM   Specimen: Nasopharyngeal Swab  Result Value Ref Range Status   SARS Coronavirus 2 by RT PCR NEGATIVE NEGATIVE Final    Comment: (NOTE) SARS-CoV-2 target nucleic acids are NOT DETECTED. The SARS-CoV-2 RNA is generally detectable in upper respiratoy specimens during the acute phase of infection. The lowest concentration of SARS-CoV-2 viral copies this assay can detect is 131 copies/mL. A negative result does not preclude SARS-Cov-2 infection and should not be used as the sole basis for treatment or other patient management decisions. A negative result may occur with  improper specimen collection/handling, submission of specimen other than nasopharyngeal swab, presence of viral mutation(s) within the areas targeted by this assay, and inadequate number of viral copies (<131 copies/mL). A negative result must be combined with clinical observations, patient history, and epidemiological information.  The expected result is Negative. Fact Sheet for Patients:  PinkCheek.be Fact Sheet for Healthcare Providers:  GravelBags.it This test is not yet ap proved or cleared by the [place] FDA and  has been authorized for detection and/or diagnosis of SARS-CoV-2 by FDA under an Emergency Use Authorization (EUA). This EUA will remain  in effect (meaning this  test can be used) for the duration of the COVID-19 declaration under Section 564(b)(1) of the Act, 21 U.S.C. section 360bbb-3(b)(1), unless the authorization is terminated or revoked sooner.    Influenza A by PCR NEGATIVE NEGATIVE Final   Influenza B by PCR NEGATIVE NEGATIVE Final    Comment: (NOTE) The Xpert Xpress SARS-CoV-2/FLU/RSV assay is intended as an aid in  the diagnosis of influenza from Nasopharyngeal swab specimens and  should not be used as a sole basis for treatment. Nasal washings and  aspirates are unacceptable for Xpert Xpress SARS-CoV-2/FLU/RSV  testing. Fact Sheet for Patients: PinkCheek.be Fact Sheet for Healthcare Providers: GravelBags.it This test is not yet approved or cleared by the [place] FDA and  has been authorized for detection and/or diagnosis of SARS-CoV-2 by  FDA under an Emergency Use Authorization (EUA). This EUA will remain  in effect (meaning this test can be used) for the duration of the  Covid-19 declaration under Section 564(b)(1) of the Act, 21  U.S.C. section 360bbb-3(b)(1), unless the authorization is  terminated or revoked. Performed at [facility], [place] [address], [place], [place] [address]     Procedures and diagnostic studies:  EEG  Result Date: 10/05/2019 Lora Havens, MD     10/05/2019  9:25 AM Patient Name: Isaiah [name] [facility] MRN: [id number] Epilepsy Attending: Lora Havens Referring Physician/Provider: Dr Amie [place] Date: 10/05/2019 Duration:  25.21 mins Patient history:  54 year old with history significant for left MCA stroke with residual right hemiparesis, right subdural hematoma and seizure disorder seen for increased dysarthria and altered mental status. Level of alertness: awake, drowsy AEDs during EEG study: clonazepam, vimpat, lamotrigine Technical aspects: This EEG study was done with scalp electrodes positioned according to the 10-20 International system of electrode placement. Electrical activity was acquired at a sampling rate of 500Hz and reviewed with a high frequency filter of 70Hz and a low frequency filter of 1Hz. EEG data were recorded continuously and digitally stored. DESCRIPTION: The posterior dominant rhythm consists of 7 Hz activity of moderate voltage (25-35 uV) seen predominantly in posterior head regions, symmetric and reactive to eye opening and eye closing. Drowsiness was characterized by attenuation of the posterior background rhythm. EEG showed intermittent 2-3Hz delta slowing in left temporal slowing. Hyperventilation and photic stimulation were not performed. ABNORMALITY - Intermittent slow, left temporal region IMPRESSION: This study is suggestive of non specific cortical dysfunction in left temporal region.  No seizures or epileptiform discharges were seen throughout the recording. Priyanka [name]    Medications:   . acyclovir  400 mg Oral QHS  . calcitRIOL  0.25 mcg Oral Q M,W,F  . Chlorhexidine Gluconate Cloth  6 each Topical Q0600  . clonazePAM  1 mg Oral BID  . fluticasone furoate-vilanterol  1 puff Inhalation Daily  . heparin  5,000 Units Subcutaneous Q8H  . ketorolac  1 drop Both Eyes QID  . lacosamide  200 mg Oral BID  . lacosamide  200 mg Oral Q M,W,F-1800  . lamoTRIgine  150 mg Oral BID  . loratadine  10 mg Oral Daily  . multivitamin  1 tablet Oral QHS  . ofloxacin  1 drop Left Eye QID  . prednisoLONE acetate  1 drop Left Eye TID   Continuous Infusions: . sodium chloride    . sodium  chloride       LOS: 1 day   Blain Pais  [facility] Time spent: 37 minutes with more than 50% of this time spent  discussing the plan of care with the patient and his wife.   10/06/2019, 2:42 PM

## 2019-10-07 DIAGNOSIS — I6203 Nontraumatic chronic subdural hemorrhage: Secondary | ICD-10-CM | POA: Diagnosis not present

## 2019-10-07 DIAGNOSIS — N186 End stage renal disease: Secondary | ICD-10-CM | POA: Diagnosis not present

## 2019-10-07 DIAGNOSIS — Z992 Dependence on renal dialysis: Secondary | ICD-10-CM | POA: Diagnosis not present

## 2019-10-07 LAB — RENAL FUNCTION PANEL
Albumin: 3.4 g/dL — ABNORMAL LOW (ref 3.5–5.0)
Anion gap: 13 (ref 5–15)
BUN: 33 mg/dL — ABNORMAL HIGH (ref 6–20)
CO2: 26 mmol/L (ref 22–32)
Calcium: 8.9 mg/dL (ref 8.9–10.3)
Chloride: 96 mmol/L — ABNORMAL LOW (ref 98–111)
Creatinine, Ser: 9.1 mg/dL — ABNORMAL HIGH (ref 0.61–1.24)
GFR calc Af Amer: 7 mL/min — ABNORMAL LOW (ref >60)
GFR calc non Af Amer: 6 mL/min — ABNORMAL LOW (ref >60)
Glucose, Bld: 118 mg/dL — ABNORMAL HIGH (ref 70–99)
Phosphorus: 4.3 mg/dL (ref 2.5–4.6)
Potassium: 4 mmol/L (ref 3.5–5.1)
Sodium: 135 mmol/L (ref 135–145)

## 2019-10-07 LAB — CBC WITH DIFFERENTIAL/PLATELET
Abs Immature Granulocytes: 0.01 10*3/uL (ref 0.00–0.07)
Basophils Absolute: 0.1 10*3/uL (ref 0.0–0.1)
Basophils Relative: 1 %
Eosinophils Absolute: 0.4 10*3/uL (ref 0.0–0.5)
Eosinophils Relative: 7 %
Hemoglobin: 10.1 g/dL — ABNORMAL LOW (ref 13.0–17.0)
Immature Granulocytes: 0 %
Lymphocytes Relative: 6 %
Lymphs Abs: 0.4 10*3/uL — ABNORMAL LOW (ref 0.7–4.0)
Monocytes Absolute: 1.3 10*3/uL — ABNORMAL HIGH (ref 0.1–1.0)
Monocytes Relative: 22 %
Neutro Abs: 3.7 10*3/uL (ref 1.7–7.7)
Neutrophils Relative %: 64 %
Platelets: 288 10*3/uL (ref 150–400)
WBC: 5.8 10*3/uL (ref 4.0–10.5)

## 2019-10-07 MED ORDER — LIDOCAINE HCL (PF) 1 % IJ SOLN: 5.0000 mL | INTRAMUSCULAR | Status: DC | PRN

## 2019-10-07 MED ORDER — ALTEPLASE 2 MG IJ SOLR: 2.0000 mg | Freq: Once | INTRAMUSCULAR | Status: DC | PRN

## 2019-10-07 MED ORDER — HEPARIN SODIUM (PORCINE) 1000 UNIT/ML DIALYSIS: 1000.0000 [IU] | INTRAMUSCULAR | Status: DC | PRN

## 2019-10-07 MED ORDER — NEPRO/CARBSTEADY PO LIQD: 237.0000 mL | Freq: Three times a day (TID) | ORAL | 12 refills | Status: DC | PRN

## 2019-10-07 MED ORDER — LIDOCAINE-PRILOCAINE 2.5-2.5 % EX CREA: 1.0000 "application " | TOPICAL_CREAM | CUTANEOUS | Status: DC | PRN

## 2019-10-07 MED ORDER — SODIUM CHLORIDE 0.9 % IV SOLN: 100.0000 mL | INTRAVENOUS | Status: DC | PRN

## 2019-10-07 MED ORDER — PENTAFLUOROPROP-TETRAFLUOROETH EX AERO: 1.0000 "application " | INHALATION_SPRAY | CUTANEOUS | Status: DC | PRN

## 2019-10-07 MED ORDER — CALCITRIOL 0.25 MCG PO CAPS
ORAL_CAPSULE | ORAL | Status: AC
  Administered 2019-10-07: 0.25 ug via ORAL
  Filled 2019-10-07: qty 1

## 2019-10-07 NOTE — Plan of Care (Signed)

## 2019-10-07 NOTE — Progress Notes (Signed)
[facility] Progress Note   Dialysis Orders: MWF -[place] 4hrs, [id number], [id number], [id number]  Access:RU AVF Heparinnone Mircera72mg q2wks - last 09/28/19 Venofer 582mqwk - last 09/28/19 Calcitriol0.2551mPO qHD  Assessment/Plan: 1. AMS - per neuro likely metabolic encephalopathy related to uremiadue to missing HD yesterday. Confusion improving. EEG showed non specific cortical dysfunction in L temporal region, likely related to past stroke. No seizures. Per neuro/primary. 2. Acute on chronic SDH -worsened neurologic sx over last few days, now improving. CT shows no acute changes. Per neuro. 3. ESRD- On HD MWF.on HD - labs pending  4. Hypertension/volume- Blood pressurewell controlled.off amlodipine and  Lopressor. Does not appear volume overload, titrate down as tolerated. Goal 2 L - pre bed weight 87.3 - get standing weight POST HD today  5. Anemiaof CKD- Hgb 10.1 . ESA recently dosed. Follow trends.labs pending 6. Secondary Hyperparathyroidism -Ca and phos at goal. Not on binders. Continue VDRA. 7. Nutrition- Renal diet w/fluid restrictions.  8. Multiple myeloma- followed by oncology 9. Recent aspiration PNA - on Cipro per primary 10. Seizure d/o- followed closely as OP. Continue vimpat, lamictal. EEG today showed no seizure activity. 11. A fib- not on anticoag d/t Hx GIB 12. DMT2 13. Hx Proteus bacteremia - rechecking blood cultures 1/26 - NGTD MarMyriam JacobsonA-C [facility] [id number]/2021,8:41 AM  LOS: 2 days   Subjective:   No c/o feels good  Objective Vitals:   10/06/19 1154 10/06/19 1829 10/07/19 0031 10/07/19 0541  BP: (!) 97/49 106/68 112/64 119/68  Pulse: 82 78 69 79  Resp: _0 Temp:  98.4 F (36.9 C) 98.3 F (36.8 C) 99.1 F (37.3 C)  TempSrc: Oral Oral Oral Oral  SpO2: 98% 98% 98% 97%  Weight:      Height:       Physical Exam goal on HD 2 L General:  NAD Heart: RRR Lungs: no rales anteriorly Abdomen: soft NT Extremities: no LE edema Dialysis Access: right upper AVF Qb 400   Additional Objective Labs: Basic Metabolic Panel: Recent Labs  Lab 10/04/19 0900 10/04/19 0915 10/05/19 0533  NA 135 134* 136  K 4.9 4.9 4.5  CL 92* 95* 96*  CO2 26  --  27  GLUCOSE 168* 159* 83  BUN 60* 71* 32*  CREATININE 14.87* 15.30* 9.34*  CALCIUM 9.4  --  8.4*  PHOS  --   --  3.2   Liver Function Tests: Recent Labs  Lab 10/04/19 0900  AST 23  ALT 13  ALKPHOS 83  BILITOT 1.2  PROT 6.5  ALBUMIN 4.0   No results for input(s): LIPASE, AMYLASE in the last 168 hours. CBC: Recent Labs  Lab 10/04/19 0900 10/04/19 0900 10/04/19 0915 10/05/19 0533 10/05/19 1230  WBC 7.2  --   --  7.9 8.5  NEUTROABS 5.8  --   --   --   --   HGB Unable to determine due to a cold agglutinin   < > 10.2* 10.3* 10.1*  HCT Unable to determine due to a cold agglutinin   < > 30.0* Unable to determine due to a cold agglutinin RESULTS UNAVAILABLE DUE TO INTERFERING SUBSTANCE  MCV Unable to determine due to a cold agglutinin  --   --  Unable to determine due to a cold agglutinin RESULTS UNAVAILABLE DUE TO INTERFERING SUBSTANCE  PLT 265  --   --  239 278   < > = values in this interval not displayed.  Blood Culture    Component Value Date/Time   SDES BLOOD BLOOD LEFT HAND 10/04/2019 0947   SPECREQUEST  10/04/2019 0947    BOTTLES DRAWN AEROBIC AND ANAEROBIC Blood Culture results may not be optimal due to an inadequate volume of blood received in culture bottles   CULT  10/04/2019 0947    NO GROWTH 3 DAYS Performed at [facility], [place] [address], [place], [place] [address]    REPTSTATUS PENDING 10/04/2019 7517    Cardiac Enzymes: No results for input(s): CKTOTAL, CKMB, CKMBINDEX, TROPONINI in the last 168 hours. CBG: Recent Labs  Lab 10/04/19 0900  GLUCAP 130*   Iron Studies: No results for input(s): IRON, TIBC, TRANSFERRIN, FERRITIN in the last  72 hours. Lab Results  Component Value Date   INR 1.0 10/04/2019   INR 1.2 06/09/2019   INR 1.13 11/13/2016   PROTIME 16.8 (H) 10/30/2014   Studies/Results: EEG  Result Date: 10/05/2019 Lora Havens, MD     10/05/2019  9:25 AM Patient Name: Eulalio Reamy [facility] MRN: [id number] Epilepsy Attending: Lora Havens Referring Physician/Provider: Dr Amie Portland Date: 10/05/2019 Duration: 25.21 mins Patient history:  54 year old with history significant for left MCA stroke with residual right hemiparesis, right subdural hematoma and seizure disorder seen for increased dysarthria and altered mental status. Level of alertness: awake, drowsy AEDs during EEG study: clonazepam, vimpat, lamotrigine Technical aspects: This EEG study was done with scalp electrodes positioned according to the 10-20 International system of electrode placement. Electrical activity was acquired at a sampling rate of _0  and reviewed with a high frequency filter of _1  and a low frequency filter of _2 . EEG data were recorded continuously and digitally stored. DESCRIPTION: The posterior dominant rhythm consists of 7 Hz activity of moderate voltage (25-35 uV) seen predominantly in posterior head regions, symmetric and reactive to eye opening and eye closing. Drowsiness was characterized by attenuation of the posterior background rhythm. EEG showed intermittent 2-_3  delta slowing in left temporal slowing. Hyperventilation and photic stimulation were not performed. ABNORMALITY - Intermittent slow, left temporal region IMPRESSION: This study is suggestive of non specific cortical dysfunction in left temporal region.  No seizures or epileptiform discharges were seen throughout the recording. Priyanka [name]   Medications: . sodium chloride    . sodium chloride    . sodium chloride    . sodium chloride     . acyclovir  400 mg Oral QHS  . calcitRIOL  0.25 mcg Oral Q M,W,F  . Chlorhexidine Gluconate Cloth  6 each Topical Q0600  .  clonazePAM  1 mg Oral BID  . fluticasone furoate-vilanterol  1 puff Inhalation Daily  . heparin  5,000 Units Subcutaneous Q8H  . ketorolac  1 drop Both Eyes QID  . lacosamide  200 mg Oral BID  . lacosamide  200 mg Oral Q M,W,F-1800  . lamoTRIgine  150 mg Oral BID  . loratadine  10 mg Oral Daily  . multivitamin  1 tablet Oral QHS  . ofloxacin  1 drop Left Eye QID  . prednisoLONE acetate  1 drop Left Eye TID

## 2019-10-07 NOTE — Progress Notes (Signed)
Pt given D/C paper work  Along with f/u . Pt and wife verbalized understanding All concerns addressed at bedside . Awaiting wheelchair assistance for d/c.

## 2019-10-07 NOTE — Progress Notes (Signed)
Physical Therapy Treatment Patient Details Name: Isaiah [name] MRN: [id number] DOB: 1965-09-30 Today's Date: 10/07/2019    History of Present Illness Pt is a 54 y/o male admitted secondary to acute metabolic encephalopathy likely from missed HD. PMH includes HTN, ESRD on HD, CVA with R sided deficits, COPD, a fib, and multiple myeloma.     PT Comments    Pt progressing towards goals. Improved steadiness and tolerance for gait this session. Pt's wife present during session. Reports plan is to go home with [facility] services. Current recommendations appropriate. Will continue to follow acutely.     Follow Up Recommendations  Home health PT;Supervision/Assistance - 24 hour(HHOT)     Equipment Recommendations  None recommended by PT    Recommendations for Other Services       Precautions / Restrictions Precautions Precautions: Fall Restrictions Weight Bearing Restrictions: No    Mobility  Bed Mobility Overal bed mobility: Needs Assistance Bed Mobility: Supine to Sit;Sit to Supine     Supine to sit: Min assist Sit to supine: Supervision   General bed mobility comments: Min A for trunk elevation. Increased time required.   Transfers Overall transfer level: Needs assistance Equipment used: Rolling walker (2 wheeled) Transfers: Sit to/from Stand Sit to Stand: Min assist         General transfer comment: Min A for lift assist and steadying. Cues for safe hand placement.   Ambulation/Gait Ambulation/Gait assistance: Min guard;Min assist Gait Distance (Feet): 100 Feet Assistive device: Rolling walker (2 wheeled) Gait Pattern/deviations: Step-to pattern;Decreased step length - right;Decreased step length - left;Decreased weight shift to right Gait velocity: Decreased   General Gait Details: Improved steadiness noted with gait this session. Min guard A for steadying with occasional min A required.    Stairs             Wheelchair Mobility    Modified Rankin  (Stroke Patients Only)       Balance Overall balance assessment: Needs assistance Sitting-balance support: No upper extremity supported;Feet supported Sitting balance-Leahy Scale: Fair     Standing balance support: Bilateral upper extremity supported;During functional activity Standing balance-Leahy Scale: Poor Standing balance comment: Reliant on BUE support                             Cognition Arousal/Alertness: Awake/alert Behavior During Therapy: WFL for tasks assessed/performed Overall Cognitive Status: History of cognitive impairments - at baseline                                        Exercises      General Comments        Pertinent Vitals/Pain Pain Assessment: No/denies pain    Home Living                      Prior Function            PT Goals (current goals can now be found in the care plan section) Acute Rehab PT Goals Patient Stated Goal: to go home PT Goal Formulation: With patient Time For Goal Achievement: 10/19/19 Potential to Achieve Goals: Good Progress towards PT goals: Progressing toward goals    Frequency    Min 3X/week      PT Plan Current plan remains appropriate    Co-evaluation  AM-PAC PT "6 Clicks" Mobility   Outcome Measure  Help needed turning from your back to your side while in a flat bed without using bedrails?: A Little Help needed moving from lying on your back to sitting on the side of a flat bed without using bedrails?: A Little Help needed moving to and from a bed to a chair (including a wheelchair)?: A Little Help needed standing up from a chair using your arms (e.g., wheelchair or bedside chair)?: A Little Help needed to walk in hospital room?: A Little Help needed climbing 3-5 steps with a railing? : A Lot 6 Click Score: 17    End of Session Equipment Utilized During Treatment: Gait belt Activity Tolerance: Patient tolerated treatment well Patient  left: in bed;with call bell/phone within reach;with family/visitor present Nurse Communication: Mobility status PT Visit Diagnosis: Unsteadiness on feet (R26.81);Muscle weakness (generalized) (M62.81)     Time: 3817-7116 PT Time Calculation (min) (ACUTE ONLY): 17 min  Charges:  $Gait Training: 8-22 mins                     Isaiah [name], PT, DPT  Acute Rehabilitation Services  Pager: [phone] Office: [phone]    Isaiah [name] 10/07/2019, 4:21 PM

## 2019-10-07 NOTE — Discharge Instructions (Signed)
[facility] to arrange Home Rehab

## 2019-10-07 NOTE — Discharge Summary (Signed)
Physician Discharge Summary  [place] YQI:[id number] DOB: 12-Nov-1965 DOA: 10/04/2019  PCP: [name], MD  Admit date: 10/04/2019 Discharge date: 10/07/2019  Admitted From: Home Disposition:  Home with [facility]  Recommendations for Outpatient Follow-up:  1. Follow up with PCP in 1-2 weeks 2. Please obtain BMP/CBC in one week   Home Health:Yes, PT and OT Equipment/Devices:None  Discharge Condition:Stable CODE STATUS: Full Diet recommendation: Renal diet   Brief/Interim Summary:  HPI: Isaiah [name] is a 54 y.o. male with medical history significant of CVA; OSA on CPAP; seizures; SDH (05/2019); PE on Coumadin; multiple myeloma; HTN; ESRD on MWF HD; DM; and afib presenting with Code Stroke.  He reports that he coughs when he gets, uh... His "wife thinks I cough a lot when I do this thing here."  Denies confusion.  Denies headache.  No dysphagia (failed swallow screen due to recurrent coughing).  No N/W/T of arms/legs.  No pain.  Last HD was "yesterday, which is Monday."  He didn't go because he was "coughing a lot and uh, I didn't, uh, because I didn't have anyone to ... I wouldn't go because my confusion."  He was admitted at [facility] from 9/17-29 for planned autologous stem cell transplant for multiple myeloma. However, he had seizure activity following HD on 9/18, and head CT revealed an acute on chronic R-sided SDH. He was also found to have Proteus mirabilis bacteremia (pan-sensitive), which was treated with Zosyn. He was found to have B renal stones, but urology deferred removal in the setting of bacteremia. He was transitioned to Cipro for continuous prophylactic therapy. He was also noted to have aspiration PNA. For his seizures, he was changed to Vimpat and Depabote with Klonopin after HD. Given his multitude of medical problems, stem cell transplantation was held.  He was subsequently hospitalized at [name] from 10/1-6 for acute on chronic SDH but did not require neurosurgical  intervention.  He was discharged to [facility] and remained there until 10/22.  ED Course:  Seen by neurology for ?CVA.  Missed HD yesterday.  CTs unchanged from baseline.  Has been vomiting.  Likely metabolic encephalopathy.  Dr. Jonnie Finner will arrange HD.  Hospital Course:  AMS - Metabolic encepahlopathy from missed HD, likely cause -Patient presenting with encephalopathy as evidenced byhis confusion, difficulty answering questions. -Initial concern was for CVA but further evaluation makes it appears that this is uremia causing a metabolic encephalopathy - likely associated with missing HD prior to his presentation.  -EEG done with intermittent slow in the left temporal region: Questionable related to old CVAs. -Has had hypotension at home with SBP in the 80s to 60s range every evening on his HD days and has "episodes" of not being able to move or answer his wife during those times. Per his wife, she had not share this information with another physician until today.  -Patient on chronicMWFHD -Nephrology consult with HD on 1/26 -Continue renavite -Continue Klonopin -Patient with improvement in mentation as BP normalized.  -Discontinued metoprolol--he was taking this on non-HD days only with SBP in the 120s range as the highest readings at home. Amlodipine on hold.  -Could consider starting midodrine on HD days, if BP remains low in the outpatient setting. Discussed with Nephrology. They want to monitor for now. His BP was stable on the day of discharge, after he had HD.      Chronic SDH -Patient withprior admissionforacute on chronic R-sided SDH -Given his encephalopathy on presentation, there was concern that this could  be related to acute CVA -Neurology evaluated and instead thought this was more likely related to uremia -Has impaired mobility at baseline, will resume HH PT and OT.   Multiple myeloma -Patient had been previously followed for MGUS but bone marrow biopsy in 2018 showed  almost 20% plasma cell infiltration -Negative skeletal survey in 2/19 -Treatment was deferred in 4/19 due to lack of symptoms -8/19 bone marrow biopsy with 25% abnormal plasma cells, FISH with IgG kappa -Skeletal survey remained negative -He was recently admitted to [facility] for autologous stem cell transplant, but this was delayed due to other medical problems that occurred while hospitalized -He is currently receiving weekly treatment with Darzalex Faspro with Decadron weekly and will resume this after discharge.   Seizure d/o -Started after patient's CVA -Continue Vimpat, Lamictal  Afib -Reported h/o afib and PFO -IVC filter is in place -He is not a candidate for [facility] due to multiple falls and subdural hematoma -Rate control despite discontinuation of metoprolol   DM -He appears to be only on SSI -Recent A1c was 5.1 -He does not appear to need treatment for this at this time  HTN -Discontinued Lopressor -Norvasc discontinued.   COPD -Continue Breo Ellipta -Continue Albuterol HFA prn  L eye infection -Continue Acular, ofloxacin drops, and Pred Forte  Discharge Diagnoses:  Principal Problem:   Acute metabolic encephalopathy Active Problems:   ESRD (end stage renal disease) on dialysis (HCC)   Benign essential HTN   Controlled type 2 diabetes mellitus with diabetic nephropathy, without long-term current use of insulin (HCC)   Hemiparesis affecting right side as late effect of cerebrovascular accident ([place])   Partial idiopathic epilepsy with seizures of localized onset, intractable, without status epilepticus ([place])   Chronic intracranial subdural hematoma (HCC)   Multiple myeloma not having achieved remission (HCC)   Atrial fibrillation, chronic (HCC)   COPD (chronic obstructive pulmonary disease) ([place])   AMS (altered mental status)    Discharge Instructions  Discharge Instructions    Diet - low sodium heart healthy   Complete by: As directed    Increase  activity slowly   Complete by: As directed    Increase activity slowly   Complete by: As directed      Allergies as of 10/07/2019      Reactions   Codeine Rash, Other (See Comments)   Unknown reaction (patient says it was more serious than just a rash, but he can't remember what happened) Unknown reaction (patient says it was more serious than just a rash, but he can't remember what happened)   Codeine Swelling      Medication List    STOP taking these medications   amLODipine 2.5 MG tablet Commonly known as: NORVASC   metoprolol tartrate 25 MG tablet Commonly known as: LOPRESSOR     TAKE these medications   acetaminophen 325 MG tablet Commonly known as: TYLENOL Take 1-2 tablets (325-650 mg total) by mouth every 4 (four) hours as needed for mild pain.   acyclovir 400 MG tablet Commonly known as: ZOVIRAX Take 400 mg by mouth at bedtime.   albuterol 108 (90 Base) MCG/ACT inhaler Commonly known as: ProAir HFA Inhale two puffs every 4-6 hours if needed for cough or wheeze   Breo Ellipta 200-25 MCG/INH Aepb Generic drug: fluticasone furoate-vilanterol Inhale 1 puff into the lungs daily.   calcium carbonate 500 MG chewable tablet Commonly known as: TUMS - dosed in mg elemental calcium Chew 2 tablets by mouth at bedtime.   camphor-menthol  lotion Commonly known as: SARNA Apply 1 application topically every 8 (eight) hours as needed for itching.   Cetirizine HCl 10 MG Caps Take 1 capsule by mouth daily.   cinacalcet 60 MG tablet Commonly known as: SENSIPAR Take 60 mg by mouth every evening. ON HOLD FOR DIALYSIS PER WIFE   clonazePAM 0.5 MG tablet Commonly known as: KLONOPIN Take 2 tablets every Monday, Wednesday, and Friday with hemodialysis   DARZALEX FASPRO [place] Inject 1 Dose into the skin once a week. On Tuesday   dexamethasone 4 MG tablet Commonly known as: DECADRON Take 5 tablets (20 mg total) by mouth once a week. Take 5 tablets (20 mg total) by mouth once a  week on day of chemo.   feeding supplement (NEPRO CARB STEADY) Liqd Take 237 mLs by mouth 3 (three) times daily as needed (Supplement).   HUMALOG KWIKPEN [place] Inject 2-4 Units into the skin as needed (to control Glucose). Sliding scale: 175-200 =2 units, 201-250=3 units,-251 += 4 units   hydrOXYzine 25 MG tablet Commonly known as: ATARAX/VISTARIL Take 1 tablet (25 mg total) by mouth every 8 (eight) hours as needed for itching.   ketorolac 0.5 % ophthalmic solution Commonly known as: ACULAR Place 1 drop into both eyes 4 (four) times daily.   lacosamide 200 MG Tabs tablet Commonly known as: Vimpat Take 1 tablet twice a day except on dialysis days M-W-F give 1 tab in AM, 1 tab after dialysis, 1 tab in PM.   lamoTRIgine 150 MG tablet Commonly known as: LAMICTAL Take 1 tablet (150 mg total) by mouth 2 (two) times daily.   multivitamin Tabs tablet Take 1 tablet by mouth at bedtime.   ofloxacin 0.3 % ophthalmic solution Commonly known as: OCUFLOX Place 1 drop into the left eye 4 (four) times daily.   pantoprazole 40 MG tablet Commonly known as: PROTONIX Take 40 mg by mouth daily.   prednisoLONE acetate 1 % ophthalmic suspension Commonly known as: PRED FORTE Place 1 drop into the left eye 3 (three) times daily.   prochlorperazine 10 MG tablet Commonly known as: COMPAZINE TAKE 1 TABLET (10 MG TOTAL) BY MOUTH EVERY 6 (SIX) HOURS AS NEEDED FOR NAUSEA OR VOMITING.       Allergies  Allergen Reactions  . Codeine Rash and Other (See Comments)    Unknown reaction (patient says it was more serious than just a rash, but he can't remember what happened) Unknown reaction (patient says it was more serious than just a rash, but he can't remember what happened)  . Codeine Swelling    Consultations:  Neurology, Nephrology    Procedures/Studies: EEG  Result Date: 10/05/2019 Lora Havens, MD     10/05/2019  9:25 AM Patient Name: Isaiah [name] [facility] MRN: [id number] Epilepsy Attending:  Lora Havens Referring Physician/Provider: Dr Amie [place] Date: 10/05/2019 Duration: 25.21 mins Patient history:  54 year old with history significant for left MCA stroke with residual right hemiparesis, right subdural hematoma and seizure disorder seen for increased dysarthria and altered mental status. Level of alertness: awake, drowsy AEDs during EEG study: clonazepam, vimpat, lamotrigine Technical aspects: This EEG study was done with scalp electrodes positioned according to the 10-20 International system of electrode placement. Electrical activity was acquired at a sampling rate of _0  and reviewed with a high frequency filter of _1  and a low frequency filter of _2 . EEG data were recorded continuously and digitally stored. DESCRIPTION: The posterior dominant rhythm consists of 7 Hz activity of moderate voltage (25-35 uV)  seen predominantly in posterior head regions, symmetric and reactive to eye opening and eye closing. Drowsiness was characterized by attenuation of the posterior background rhythm. EEG showed intermittent 2-_0  delta slowing in left temporal slowing. Hyperventilation and photic stimulation were not performed. ABNORMALITY - Intermittent slow, left temporal region IMPRESSION: This study is suggestive of non specific cortical dysfunction in left temporal region.  No seizures or epileptiform discharges were seen throughout the recording. Lora Havens   CT Code Stroke CTA Head W/WO contrast  Result Date: 10/04/2019 CLINICAL DATA:  Code stroke, follow-up EXAM: CT ANGIOGRAPHY HEAD AND NECK CT PERFUSION BRAIN TECHNIQUE: Multidetector CT imaging of the head and neck was performed using the standard protocol during bolus administration of intravenous contrast. Multiplanar CT image reconstructions and MIPs were obtained to evaluate the vascular anatomy. Carotid stenosis measurements (when applicable) are obtained utilizing NASCET criteria, using the distal internal carotid diameter as the  denominator. Multiphase CT imaging of the brain was performed following IV bolus contrast injection. Subsequent parametric perfusion maps were calculated using RAPID software. CONTRAST:  167m OMNIPAQUE IOHEXOL 350 MG/ML SOLN COMPARISON:  None. FINDINGS: CTA NECK FINDINGS Aortic arch: Great vessel origins are patent. Right carotid system: Patent. Minimal calcified plaque at the ICA origin without measurable stenosis. Left carotid system: Patent. Mild calcified and noncalcified plaque at the ICA origin causing minimal stenosis. Vertebral arteries: Patent and codominant. No stenosis or evidence of dissection. Skeleton: Mild degenerative changes of the cervical spine. Other neck: A lipoma is present in the right neck mildly displacing the submandibular gland. No adenopathy. Upper chest: No apical lung mass. Review of the MIP images confirms the above findings CTA HEAD FINDINGS Anterior circulation: Intracranial internal carotid arteries patent with mild calcified plaque. Anterior and middle cerebral arteries are patent. Left A1 ACA is dominant. Posterior circulation: Intracranial vertebral arteries, basilar artery, and posterior cerebral arteries are patent. A left posterior communicating artery is present. Venous sinuses: As permitted by contrast timing, patent. Review of the MIP images confirms the above findings CT Brain Perfusion Findings: CBF (<30%) Volume: 043m There is expected decreased CBF and CBV corresponding to the chronic infarcts on CT. Perfusion (Tmax>6.0s) volume: 12m76mismatch Volume: 12mL69mfarction Location: None IMPRESSION: No large vessel occlusion or hemodynamically significant stenosis. No evidence of territory at risk by perfusion imaging. Electronically Signed   By: PranMacy Mis.   On: 10/04/2019 09:45   CT Code Stroke CTA Neck W/WO contrast  Result Date: 10/04/2019 CLINICAL DATA:  Code stroke, follow-up EXAM: CT ANGIOGRAPHY HEAD AND NECK CT PERFUSION BRAIN TECHNIQUE: Multidetector CT  imaging of the head and neck was performed using the standard protocol during bolus administration of intravenous contrast. Multiplanar CT image reconstructions and MIPs were obtained to evaluate the vascular anatomy. Carotid stenosis measurements (when applicable) are obtained utilizing NASCET criteria, using the distal internal carotid diameter as the denominator. Multiphase CT imaging of the brain was performed following IV bolus contrast injection. Subsequent parametric perfusion maps were calculated using RAPID software. CONTRAST:  1012mL16mIPAQUE IOHEXOL 350 MG/ML SOLN COMPARISON:  None. FINDINGS: CTA NECK FINDINGS Aortic arch: Great vessel origins are patent. Right carotid system: Patent. Minimal calcified plaque at the ICA origin without measurable stenosis. Left carotid system: Patent. Mild calcified and noncalcified plaque at the ICA origin causing minimal stenosis. Vertebral arteries: Patent and codominant. No stenosis or evidence of dissection. Skeleton: Mild degenerative changes of the cervical spine. Other neck: A lipoma is present in the right neck mildly displacing the submandibular  gland. No adenopathy. Upper chest: No apical lung mass. Review of the MIP images confirms the above findings CTA HEAD FINDINGS Anterior circulation: Intracranial internal carotid arteries patent with mild calcified plaque. Anterior and middle cerebral arteries are patent. Left A1 ACA is dominant. Posterior circulation: Intracranial vertebral arteries, basilar artery, and posterior cerebral arteries are patent. A left posterior communicating artery is present. Venous sinuses: As permitted by contrast timing, patent. Review of the MIP images confirms the above findings CT Brain Perfusion Findings: CBF (<30%) Volume: 782m. There is expected decreased CBF and CBV corresponding to the chronic infarcts on CT. Perfusion (Tmax>6.0s) volume: 025mMismatch Volume: 82m1082mnfarction Location: None IMPRESSION: No large vessel occlusion or  hemodynamically significant stenosis. No evidence of territory at risk by perfusion imaging. Electronically Signed   By: PraMacy MisD.   On: 10/04/2019 09:45   CT Code Stroke Cerebral Perfusion with contrast  Result Date: 10/04/2019 CLINICAL DATA:  Code stroke, follow-up EXAM: CT ANGIOGRAPHY HEAD AND NECK CT PERFUSION BRAIN TECHNIQUE: Multidetector CT imaging of the head and neck was performed using the standard protocol during bolus administration of intravenous contrast. Multiplanar CT image reconstructions and MIPs were obtained to evaluate the vascular anatomy. Carotid stenosis measurements (when applicable) are obtained utilizing NASCET criteria, using the distal internal carotid diameter as the denominator. Multiphase CT imaging of the brain was performed following IV bolus contrast injection. Subsequent parametric perfusion maps were calculated using RAPID software. CONTRAST:  1082m82mNIPAQUE IOHEXOL 350 MG/ML SOLN COMPARISON:  None. FINDINGS: CTA NECK FINDINGS Aortic arch: Great vessel origins are patent. Right carotid system: Patent. Minimal calcified plaque at the ICA origin without measurable stenosis. Left carotid system: Patent. Mild calcified and noncalcified plaque at the ICA origin causing minimal stenosis. Vertebral arteries: Patent and codominant. No stenosis or evidence of dissection. Skeleton: Mild degenerative changes of the cervical spine. Other neck: A lipoma is present in the right neck mildly displacing the submandibular gland. No adenopathy. Upper chest: No apical lung mass. Review of the MIP images confirms the above findings CTA HEAD FINDINGS Anterior circulation: Intracranial internal carotid arteries patent with mild calcified plaque. Anterior and middle cerebral arteries are patent. Left A1 ACA is dominant. Posterior circulation: Intracranial vertebral arteries, basilar artery, and posterior cerebral arteries are patent. A left posterior communicating artery is present.  Venous sinuses: As permitted by contrast timing, patent. Review of the MIP images confirms the above findings CT Brain Perfusion Findings: CBF (<30%) Volume: 82mL.682mere is expected decreased CBF and CBV corresponding to the chronic infarcts on CT. Perfusion (Tmax>6.0s) volume: 82mL M782match Volume: 82mL In91mction Location: None IMPRESSION: No large vessel occlusion or hemodynamically significant stenosis. No evidence of territory at risk by perfusion imaging. Electronically Signed   By: PraneilMacy Mis On: 10/04/2019 09:45   DG Chest Portable 1 View  Result Date: 10/04/2019 CLINICAL DATA:  Weakness. EXAM: PORTABLE CHEST 1 VIEW COMPARISON:  06/09/2019 FINDINGS: Numerous leads and wires project over the chest. Patient rotated left. Midline trachea. Mild cardiomegaly. No pleural effusion or pneumothorax. Clear lungs. IMPRESSION: No acute cardiopulmonary disease. Electronically Signed   By: Kyle  TAbigail Miyamoto On: 10/04/2019 10:28   CT HEAD CODE STROKE WO CONTRAST  Result Date: 10/04/2019 CLINICAL DATA:  Code stroke.  Slurred speech. EXAM: CT HEAD WITHOUT CONTRAST TECHNIQUE: Contiguous axial images were obtained from the base of the skull through the vertex without intravenous contrast. COMPARISON:  07/14/2019 FINDINGS: Brain: A subdural hematoma laterally over the right  cerebral convexity demonstrates increased density compared to the prior study though remains hypoattenuating relative to cortex and has not significantly changed in size with a maximal thickness of 9 mm. No definite acute/hyperdense blood products are identified. Leftward midline shift measures 3 mm, unchanged. Chronic right occipital, left frontal, left temporal lobe, and left cerebellar infarcts are unchanged. Hypodensities elsewhere in the cerebral white matter bilaterally and in the pons are nonspecific but compatible with mild chronic small vessel ischemic disease. There is mild cerebral atrophy. Vascular: Calcified atherosclerosis at  the skull base. No hyperdense vessel. Skull: No fracture suspicious osseous lesion. Sinuses/Orbits: Unchanged left maxillary sinus mucous retention cyst. Clear mastoid air cells. Bilateral cataract extraction. Other: Unchanged 1.6 cm left suboccipital sebaceous cyst. ASPECTS ([place] Stroke Program Early CT Score) - Ganglionic level infarction (caudate, lentiform nuclei, internal capsule, insula, M1-M3 cortex): 7 - Supraganglionic infarction (M4-M6 cortex): 3 Total score (0-10 with 10 being normal): 10 IMPRESSION: 1. No acute infarct identified.  ASPECTS of 10. 2. Chronic subdural hematoma over the right cerebral convexity, mildly increased in density compared to the prior CT though unchanged in size and without definite acute hemorrhage. Unchanged 3 mm of leftward midline shift. 3. Chronic bilateral cerebral and left cerebellar infarcts. These results were communicated to Dr. Rory Percy at 9:21 am on 10/04/2019 by text page via the [facility] messaging system. Electronically Signed   By: Logan Bores M.D.   On: 10/04/2019 09:22   AMBULATORY EEG  Result Date: 09/21/2019 Cameron Sprang, MD     09/27/2019  1:39 PM ELECTROENCEPHALOGRAM REPORT Dates of Recording: 09/21/2019 9:13AM to 09/23/2019 9:15AM Patient's Name: Gianpaolo [facility] MRN: [id number] Date of Birth: May 08, 1966 Referring Provider: Dr. Ellouise Newer Procedure: 48-hour ambulatory video EEG History: This is a 54 year old man with a history of left MCA stroke, seizures usually occurring at dialysis, recent right SDH, with new episodes of sweating with mouth twisted, unable to walk, staring off into space, lasting for an hour. EEG for classification. Medications: Lacosamide, Lamotrigine, clonazepam during HD Technical Summary: This is a 48-hour multichannel digital video EEG recording measured by the international 10-20 system with electrodes applied with paste and impedances below 5000 ohms performed as portable with EKG monitoring.  The digital EEG was referentially  recorded, reformatted, and digitally filtered in a variety of bipolar and referential montages for optimal display.  DESCRIPTION OF RECORDING: During maximal wakefulness, the background activity consisted of a symmetric 7 Hz posterior dominant rhythm which was reactive to eye opening. There is occasional focal theta and delta slowing over the left temporal region.  There were no epileptiform discharges seen in wakefulness. During the recording, the patient progresses through wakefulness, drowsiness, and Stage 2 sleep. Similar focal slowing is seen over the left temporal region.  Again, there were no epileptiform discharges seen. Events: On 1/14/at 1037 hours, legs are weak on the right side. Patient is not on video. Electrographically, there were no EEG or EKG changes seen. On 1/15 between 0743 to 0800 hours, legs are weak, not understanding commands. Patient is not on video. Electrographically, there were no EEG or EKG changes seen. There were no electrographic seizures seen.  EKG lead was unremarkable. IMPRESSION: This 48-hour ambulatory video EEG study is abnormal due to the presence of: 1. Slowing of the posterior dominant rhythm 2. Occasional focal slowing over the left temporal region CLINICAL CORRELATION of the above findings indicates focal cerebral dysfunction in the left temporal region consistent with prior stroke in this region. Slowing of  the posterior dominant rhythm indicates diffuse cerebral dysfunction that is non-specific in etiology and may be seen with hypoxic/ischemic injury, toxic/metabolic encephalopathy, neurodegenerative disease, or excessive drowsiness. Episode of confusion and weakness did not show any epileptiform correlate. If further clinical questions remain, inpatient video EEG monitoring may be helpful. Clinical correlation is advised. Ellouise Newer, M.D.     Subjective: Patient with mentation at baseline. Reports feeling much better. BP is stable.   Discharge Exam: Vitals:    10/07/19 1300 10/07/19 1400  BP: 124/71 (!) 143/75  Pulse:  83  Resp:    Temp:    SpO2:     Vitals:   10/07/19 0541 10/07/19 1229 10/07/19 1300 10/07/19 1400  BP: 119/68 133/67 124/71 (!) 143/75  Pulse: 79 86  83  Resp: 18 18    Temp: 99.1 F (37.3 C) (!) 97.3 F (36.3 C)    TempSrc: Oral Oral    SpO2: 97% 98%    Weight:      Height:        General: Pt is alert, awake, not in acute distress Cardiovascular: RRR, S1 and S2 present Respiratory: No respiratory distress, no wheezing, no rhonchi Abdominal: Soft, NT, ND Extremities: no edema, no cyanosis    The results of significant diagnostics from this hospitalization (including imaging, microbiology, ancillary and laboratory) are listed below for reference.     Microbiology: Recent Results (from the past 240 hour(s))  Culture, blood (routine x 2)     Status: None (Preliminary result)   Collection Time: 10/04/19  9:42 AM   Specimen: BLOOD  Result Value Ref Range Status   Specimen Description BLOOD BLOOD LEFT HAND  Final   Special Requests   Final    BOTTLES DRAWN AEROBIC AND ANAEROBIC Blood Culture adequate volume   Culture   Final    NO GROWTH 3 DAYS Performed at [facility], [address] [place], [place] [address]    Report Status PENDING  Incomplete  Culture, blood (routine x 2)     Status: None (Preliminary result)   Collection Time: 10/04/19  9:47 AM   Specimen: BLOOD  Result Value Ref Range Status   Specimen Description BLOOD BLOOD LEFT HAND  Final   Special Requests   Final    BOTTLES DRAWN AEROBIC AND ANAEROBIC Blood Culture results may not be optimal due to an inadequate volume of blood received in culture bottles   Culture   Final    NO GROWTH 3 DAYS Performed at [facility], [place] [address] [place], [place] [address]    Report Status PENDING  Incomplete  Respiratory Panel by RT PCR (Flu A&B, Covid) - Nasopharyngeal Swab     Status: None   Collection Time: 10/04/19 10:16 AM    Specimen: Nasopharyngeal Swab  Result Value Ref Range Status   SARS Coronavirus 2 by RT PCR NEGATIVE NEGATIVE Final    Comment: (NOTE) SARS-CoV-2 target nucleic acids are NOT DETECTED. The SARS-CoV-2 RNA is generally detectable in upper respiratoy specimens during the acute phase of infection. The lowest concentration of SARS-CoV-2 viral copies this assay can detect is 131 copies/mL. A negative result does not preclude SARS-Cov-2 infection and should not be used as the sole basis for treatment or other patient management decisions. A negative result may occur with  improper specimen collection/handling, submission of specimen other than nasopharyngeal swab, presence of viral mutation(s) within the areas targeted by this assay, and inadequate number of viral copies (<131 copies/mL). A  negative result must be combined with clinical observations, patient history, and epidemiological information. The expected result is Negative. Fact Sheet for Patients:  PinkCheek.be Fact Sheet for Healthcare Providers:  GravelBags.it This test is not yet ap proved or cleared by the [place] FDA and  has been authorized for detection and/or diagnosis of SARS-CoV-2 by FDA under an Emergency Use Authorization (EUA). This EUA will remain  in effect (meaning this test can be used) for the duration of the COVID-19 declaration under Section 564(b)(1) of the Act, 21 U.S.C. section 360bbb-3(b)(1), unless the authorization is terminated or revoked sooner.    Influenza A by PCR NEGATIVE NEGATIVE Final   Influenza B by PCR NEGATIVE NEGATIVE Final    Comment: (NOTE) The Xpert Xpress SARS-CoV-2/FLU/RSV assay is intended as an aid in  the diagnosis of influenza from Nasopharyngeal swab specimens and  should not be used as a sole basis for treatment. Nasal washings and  aspirates are unacceptable for Xpert Xpress SARS-CoV-2/FLU/RSV  testing. Fact Sheet  for Patients: PinkCheek.be Fact Sheet for Healthcare Providers: GravelBags.it This test is not yet approved or cleared by the [place] FDA and  has been authorized for detection and/or diagnosis of SARS-CoV-2 by  FDA under an Emergency Use Authorization (EUA). This EUA will remain  in effect (meaning this test can be used) for the duration of the  Covid-19 declaration under Section 564(b)(1) of the Act, 21  U.S.C. section 360bbb-3(b)(1), unless the authorization is  terminated or revoked. Performed at [facility], [place] [address] [place], [place] [address]      Labs: BNP (last 3 results) No results for input(s): BNP in the last 8760 hours. Basic Metabolic Panel: Recent Labs  Lab 10/04/19 0900 10/04/19 0915 10/05/19 0533 10/07/19 0835  NA 135 134* 136 135  K 4.9 4.9 4.5 4.0  CL 92* 95* 96* 96*  CO2 26  --  27 26  GLUCOSE 168* 159* 83 118*  BUN 60* 71* 32* 33*  CREATININE 14.87* 15.30* 9.34* 9.10*  CALCIUM 9.4  --  8.4* 8.9  PHOS  --   --  3.2 4.3   Liver Function Tests: Recent Labs  Lab 10/04/19 0900 10/07/19 0835  AST 23  --   ALT 13  --   ALKPHOS 83  --   BILITOT 1.2  --   PROT 6.5  --   ALBUMIN 4.0 3.4*   No results for input(s): LIPASE, AMYLASE in the last 168 hours. No results for input(s): AMMONIA in the last 168 hours. CBC: Recent Labs  Lab 10/04/19 0900 10/04/19 0915 10/05/19 0533 10/05/19 1230 10/07/19 0835  WBC 7.2  --  7.9 8.5 5.8  NEUTROABS 5.8  --   --   --  3.7  HGB Unable to determine due to a cold agglutinin 10.2* 10.3* 10.1* 10.1*  HCT Unable to determine due to a cold agglutinin 30.0* Unable to determine due to a cold agglutinin RESULTS UNAVAILABLE DUE TO INTERFERING SUBSTANCE RESULTS UNAVAILABLE DUE TO INTERFERING SUBSTANCE  MCV Unable to determine due to a cold agglutinin  --  Unable to determine due to a cold agglutinin RESULTS UNAVAILABLE DUE TO INTERFERING SUBSTANCE  RESULTS UNAVAILABLE DUE TO INTERFERING SUBSTANCE  PLT 265  --  239 278 288   Cardiac Enzymes: No results for input(s): CKTOTAL, CKMB, CKMBINDEX, TROPONINI in the last 168 hours. BNP: Invalid input(s): POCBNP CBG: Recent Labs  Lab 10/04/19 0900  GLUCAP 130*   D-Dimer No results for input(s): DDIMER  in the last 72 hours. Hgb A1c No results for input(s): HGBA1C in the last 72 hours. Lipid Profile No results for input(s): CHOL, HDL, LDLCALC, TRIG, CHOLHDL, LDLDIRECT in the last 72 hours. Thyroid function studies No results for input(s): TSH, T4TOTAL, T3FREE, THYROIDAB in the last 72 hours.  Invalid input(s): FREET3 Anemia work up No results for input(s): VITAMINB12, FOLATE, FERRITIN, TIBC, IRON, RETICCTPCT in the last 72 hours. Urinalysis    Component Value Date/Time   COLORURINE YELLOW 12/03/2015 [place] 12/03/2015 1746   LABSPEC 1.009 12/03/2015 1746   PHURINE 5.5 12/03/2015 1746   GLUCOSEU 100 (A) 12/03/2015 1746   HGBUR NEGATIVE 12/03/2015 1746   BILIRUBINUR NEGATIVE 12/03/2015 1746   KETONESUR NEGATIVE 12/03/2015 1746   PROTEINUR 100 (A) 12/03/2015 1746   UROBILINOGEN 0.2 10/01/2014 1124   NITRITE NEGATIVE 12/03/2015 1746   LEUKOCYTESUR NEGATIVE 12/03/2015 1746   Sepsis Labs Invalid input(s): PROCALCITONIN,  WBC,  LACTICIDVEN Microbiology Recent Results (from the past 240 hour(s))  Culture, blood (routine x 2)     Status: None (Preliminary result)   Collection Time: 10/04/19  9:42 AM   Specimen: BLOOD  Result Value Ref Range Status   Specimen Description BLOOD BLOOD LEFT HAND  Final   Special Requests   Final    BOTTLES DRAWN AEROBIC AND ANAEROBIC Blood Culture adequate volume   Culture   Final    NO GROWTH 3 DAYS Performed at [facility], [place] [address] [place], [place] [address]    Report Status PENDING  Incomplete  Culture, blood (routine x 2)     Status: None (Preliminary result)   Collection Time: 10/04/19  9:47 AM   Specimen:  BLOOD  Result Value Ref Range Status   Specimen Description BLOOD BLOOD LEFT HAND  Final   Special Requests   Final    BOTTLES DRAWN AEROBIC AND ANAEROBIC Blood Culture results may not be optimal due to an inadequate volume of blood received in culture bottles   Culture   Final    NO GROWTH 3 DAYS Performed at [facility], [place] [address], [place], [place] [address]    Report Status PENDING  Incomplete  Respiratory Panel by RT PCR (Flu A&B, Covid) - Nasopharyngeal Swab     Status: None   Collection Time: 10/04/19 10:16 AM   Specimen: Nasopharyngeal Swab  Result Value Ref Range Status   SARS Coronavirus 2 by RT PCR NEGATIVE NEGATIVE Final    Comment: (NOTE) SARS-CoV-2 target nucleic acids are NOT DETECTED. The SARS-CoV-2 RNA is generally detectable in upper respiratoy specimens during the acute phase of infection. The lowest concentration of SARS-CoV-2 viral copies this assay can detect is 131 copies/mL. A negative result does not preclude SARS-Cov-2 infection and should not be used as the sole basis for treatment or other patient management decisions. A negative result may occur with  improper specimen collection/handling, submission of specimen other than nasopharyngeal swab, presence of viral mutation(s) within the areas targeted by this assay, and inadequate number of viral copies (<131 copies/mL). A negative result must be combined with clinical observations, patient history, and epidemiological information. The expected result is Negative. Fact Sheet for Patients:  PinkCheek.be Fact Sheet for Healthcare Providers:  GravelBags.it This test is not yet ap proved or cleared by the [place] FDA and  has been authorized for detection and/or diagnosis of SARS-CoV-2 by FDA under an Emergency Use Authorization (EUA). This EUA will remain  in effect (meaning this  test can be used) for the duration of the COVID-19  declaration under Section 564(b)(1) of the Act, 21 U.S.C. section 360bbb-3(b)(1), unless the authorization is terminated or revoked sooner.    Influenza A by PCR NEGATIVE NEGATIVE Final   Influenza B by PCR NEGATIVE NEGATIVE Final    Comment: (NOTE) The Xpert Xpress SARS-CoV-2/FLU/RSV assay is intended as an aid in  the diagnosis of influenza from Nasopharyngeal swab specimens and  should not be used as a sole basis for treatment. Nasal washings and  aspirates are unacceptable for Xpert Xpress SARS-CoV-2/FLU/RSV  testing. Fact Sheet for Patients: PinkCheek.be Fact Sheet for Healthcare Providers: GravelBags.it This test is not yet approved or cleared by the [place] FDA and  has been authorized for detection and/or diagnosis of SARS-CoV-2 by  FDA under an Emergency Use Authorization (EUA). This EUA will remain  in effect (meaning this test can be used) for the duration of the  Covid-19 declaration under Section 564(b)(1) of the Act, 21  U.S.C. section 360bbb-3(b)(1), unless the authorization is  terminated or revoked. Performed at [facility], [place] [address] [place], [place] [address]      Time coordinating discharge: Over 33 minutes  SIGNED:   Blain Pais, MD  [facility] 10/07/2019, 4:32 PM

## 2019-10-07 NOTE — Telephone Encounter (Signed)
He is still in the hospital, I believe this is something the inpatient providers will be signing for him before discharge?

## 2019-10-09 ENCOUNTER — Telehealth: Payer: Self-pay | Admitting: Nephrology

## 2019-10-09 LAB — CULTURE, BLOOD (ROUTINE X 2)
Culture: NO GROWTH
Culture: NO GROWTH
Special Requests: ADEQUATE

## 2019-10-09 NOTE — Telephone Encounter (Signed)
Transition of care contact from inpatient facility  Date of Discharge: 10/07/19 Date of Contact: 10/09/19 Method of contact: Phone [phone]  Attempted to contact patient to discuss transition of carefrom inpatient admission. Patient did not answer the phone. Message was left on the patient's voicemail with call back number [phone] or his dialysis unit [place] [phone] . Reminded that next dialysis treatment is Monday.[name]

## 2019-10-10 ENCOUNTER — Other Ambulatory Visit: Payer: Self-pay

## 2019-10-10 ENCOUNTER — Telehealth: Payer: Self-pay

## 2019-10-10 ENCOUNTER — Ambulatory Visit: Payer: Self-pay

## 2019-10-10 ENCOUNTER — Telehealth: Payer: Medicare Other

## 2019-10-10 DIAGNOSIS — I120 Hypertensive chronic kidney disease with stage 5 chronic kidney disease or end stage renal disease: Secondary | ICD-10-CM

## 2019-10-10 DIAGNOSIS — E1129 Type 2 diabetes mellitus with other diabetic kidney complication: Secondary | ICD-10-CM | POA: Diagnosis not present

## 2019-10-10 DIAGNOSIS — Z5111 Encounter for antineoplastic chemotherapy: Secondary | ICD-10-CM | POA: Diagnosis not present

## 2019-10-10 DIAGNOSIS — E1122 Type 2 diabetes mellitus with diabetic chronic kidney disease: Secondary | ICD-10-CM

## 2019-10-10 DIAGNOSIS — D631 Anemia in chronic kidney disease: Secondary | ICD-10-CM | POA: Diagnosis not present

## 2019-10-10 DIAGNOSIS — N186 End stage renal disease: Secondary | ICD-10-CM

## 2019-10-10 DIAGNOSIS — N2581 Secondary hyperparathyroidism of renal origin: Secondary | ICD-10-CM | POA: Diagnosis not present

## 2019-10-10 DIAGNOSIS — I69351 Hemiplegia and hemiparesis following cerebral infarction affecting right dominant side: Secondary | ICD-10-CM | POA: Diagnosis not present

## 2019-10-10 DIAGNOSIS — C9000 Multiple myeloma not having achieved remission: Secondary | ICD-10-CM | POA: Diagnosis not present

## 2019-10-10 DIAGNOSIS — D509 Iron deficiency anemia, unspecified: Secondary | ICD-10-CM | POA: Diagnosis not present

## 2019-10-10 DIAGNOSIS — Z992 Dependence on renal dialysis: Secondary | ICD-10-CM | POA: Diagnosis not present

## 2019-10-10 DIAGNOSIS — I69320 Aphasia following cerebral infarction: Secondary | ICD-10-CM | POA: Diagnosis not present

## 2019-10-10 HISTORY — PX: CATARACT EXTRACTION: SUR2

## 2019-10-10 NOTE — Telephone Encounter (Signed)
Crystal from [facility] home [facility] returned a call to the office and wanted to know if Dr. Baird Cancer was the pt's primary care provider, she was told yes and she was given the fax number to fax over orders for the patient and I also confirmed the office's address for her. [phone].

## 2019-10-10 NOTE — Chronic Care Management (AMB) (Signed)
Care Management   Follow Up Note   10/10/2019 Name: Isaiah [name] [facility] MRN: [id number] DOB: [birth year]  Referred by: Isaiah Chard, MD Reason for referral : Care Coordination (CC RNCM Case Collaboration )   Isaiah [name] is a 54 y.o. year old male who is a primary care patient of Isaiah Chard, MD. The CCM team was consulted for assistance with chronic disease management and care coordination needs.    Review of patient status, including review of consultants reports, relevant laboratory and other test results, and collaboration with appropriate care team members and the patient's provider was performed as part of comprehensive patient evaluation and provision of chronic care management services.    Received the following hospital update concerning Isaiah [name] discharge home.   [facility] Consult 10/06/2019 Isaiah [name], Isaiah [name] [id number]  [place] [[facility]] status: pending  Patient screened for extreme high risk score for unplanned readmission score  and for 3 hospitalizations  In the past 6 months. Review of patient's medical record reveals patient has Primary Care Provider is Isaiah Chard, MD at [facility], this is a [facility] Embedded practice and this practice provides the transition of care follow up.  Plan:  Following for progress and disposition needs.  Will update [facility] Embedded RN Chronic Care Manager of disposition.  Continue to follow progress and disposition to assess for post hospital care management needs.   Please place a [facility] Care Management consult as appropriate and for questions contact:   Natividad Brood, RN BSN [facility] Liaison   [phone] business mobile phone  Toll free office [phone]   Fax number: [phone]  [email or web address]  [email or web address], [email or web address] Message sent on 10/06/2019  9:49 AM  EEG with left-sided slowing, likely  secondary to the prior left MCA stroke. No seizures No new recommendations other than as listed in the progress note from earlier today. Neurology will be available as needed. Please call with questions Isaiah Portland, MD [facility] Pager: [phone] If 7pm to 7am, please call on call as listed on AMION.  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 Level of alertness: awake, drowsy AEDs during EEG study: clonazepam, vimpat, lamotrigine  Technical aspects: This EEG study was done with scalp electrodes positioned according to the 10-20 International system of electrode placement. Electrical activity was acquired at a sampling rate of 500Hz  and reviewed with a high frequency filter of 70Hz  and a low frequency filter of 1Hz . EEG data were recorded continuously and digitally stored.   DESCRIPTION: The posterior dominant rhythm consists of 7 Hz activity of moderate voltage (25-35 uV) seen predominantly in posterior head regions, symmetric and reactive to eye opening and eye closing. Drowsiness was characterized by attenuation of the posterior background rhythm. EEG showed intermittent 2-3Hz  delta slowing in left temporal slowing. Hyperventilation and photic stimulation were not performed.  ABNORMALITY  -Intermittent slow, left temporal region  IMPRESSION:  This study is suggestive of non specific cortical dysfunction in left temporal region.    No seizures or epileptiform discharges were seen throughout the recording.  Isaiah [name] is a 54 y.o. male  arriving to [place] ED via [facility] on  1/[name]  with past medical hx of HTN, recent SDH, Afib, and previous CVA. Code stroke was activated by EMS. Patient from home where he was LKW noted to be Friday at dialysis and now complaining of increased slurred speech, patient not feeling well after dialysis on Friday. Was unable to go to dialysis Monday r/t nausea and vomiting. Wife called EMS this morning for increasing slurred speech and AMS.  On No antithrombotic. Stroke team at the bedside on patient arrival. Labs drawn and patient cleared for CT by Dr. Regenia Skeeter. Patient to [place] team. NIHSS 9, see documentation for details and code stroke times. Patient with disoriented, left hemianopia, right facial droop, right arm weakness, right decreased sensation, Global aphasia  and dysarthria on exam. The following imaging was completed: CT,CTA, and  CTP. Patient is not a candidate for tPA due to patient being outside window, LKW >24 hours. Patient brought back to room, placed on cardiac monitor. Patient to be admitted, q2hr neuro assessments. Code stroke canceled by Dr. Rory Percy at 0930. Bedside handoff with ED RN Burman Nieves.   Lianne Bushy A   Stroke Response RN  Pt arrives via EMS from home with reports of LSN at 53. Wife noticed worsening slurred speech around 745. Pt nauseous and vomiting in CT, 4 mg zofran given. Pt missed HD yesterday due to weakness, n/v.   Plan:   Telephone follow up appointment with care management team member scheduled for: 10/11/19  Barb Merino, RN, BSN, CCM Care Management Coordinator [facility]  Direct Phone: [phone]

## 2019-10-11 ENCOUNTER — Telehealth: Payer: Self-pay | Admitting: Nephrology

## 2019-10-11 ENCOUNTER — Ambulatory Visit: Payer: Self-pay

## 2019-10-11 ENCOUNTER — Other Ambulatory Visit: Payer: Medicare Other

## 2019-10-11 ENCOUNTER — Other Ambulatory Visit: Payer: Self-pay

## 2019-10-11 ENCOUNTER — Inpatient Hospital Stay: Payer: Medicare Other

## 2019-10-11 ENCOUNTER — Inpatient Hospital Stay: Payer: Medicare Other | Attending: Oncology

## 2019-10-11 ENCOUNTER — Ambulatory Visit: Payer: Medicare Other

## 2019-10-11 VITALS — BP 117/45 | HR 74 | Temp 98.6°F | Resp 18

## 2019-10-11 DIAGNOSIS — C9000 Multiple myeloma not having achieved remission: Secondary | ICD-10-CM | POA: Insufficient documentation

## 2019-10-11 DIAGNOSIS — N186 End stage renal disease: Secondary | ICD-10-CM

## 2019-10-11 DIAGNOSIS — Z5111 Encounter for antineoplastic chemotherapy: Secondary | ICD-10-CM | POA: Insufficient documentation

## 2019-10-11 DIAGNOSIS — E1122 Type 2 diabetes mellitus with diabetic chronic kidney disease: Secondary | ICD-10-CM

## 2019-10-11 DIAGNOSIS — Z5112 Encounter for antineoplastic immunotherapy: Secondary | ICD-10-CM | POA: Diagnosis present

## 2019-10-11 DIAGNOSIS — I69351 Hemiplegia and hemiparesis following cerebral infarction affecting right dominant side: Secondary | ICD-10-CM | POA: Diagnosis not present

## 2019-10-11 DIAGNOSIS — I69320 Aphasia following cerebral infarction: Secondary | ICD-10-CM | POA: Diagnosis not present

## 2019-10-11 DIAGNOSIS — I120 Hypertensive chronic kidney disease with stage 5 chronic kidney disease or end stage renal disease: Secondary | ICD-10-CM | POA: Diagnosis not present

## 2019-10-11 LAB — CMP (CANCER CENTER ONLY)
ALT: 9 U/L (ref 0–44)
AST: 12 U/L — ABNORMAL LOW (ref 15–41)
Albumin: 4.1 g/dL (ref 3.5–5.0)
Alkaline Phosphatase: 98 U/L (ref 38–126)
Anion gap: 12 (ref 5–15)
BUN: 33 mg/dL — ABNORMAL HIGH (ref 6–20)
CO2: 31 mmol/L (ref 22–32)
Calcium: 9.4 mg/dL (ref 8.9–10.3)
Chloride: 96 mmol/L — ABNORMAL LOW (ref 98–111)
Creatinine: 9.3 mg/dL (ref 0.61–1.24)
GFR, Est AFR Am: 7 mL/min — ABNORMAL LOW (ref >60)
GFR, Estimated: 6 mL/min — ABNORMAL LOW (ref >60)
Glucose, Bld: 184 mg/dL — ABNORMAL HIGH (ref 70–99)
Potassium: 4.4 mmol/L (ref 3.5–5.1)
Sodium: 139 mmol/L (ref 135–145)
Total Bilirubin: 0.9 mg/dL (ref 0.3–1.2)
Total Protein: 6.8 g/dL (ref 6.5–8.1)

## 2019-10-11 LAB — CBC WITH DIFFERENTIAL (CANCER CENTER ONLY)
Abs Immature Granulocytes: 0.02 10*3/uL (ref 0.00–0.07)
Basophils Absolute: 0.1 10*3/uL (ref 0.0–0.1)
Basophils Relative: 1 %
Eosinophils Absolute: 0.3 10*3/uL (ref 0.0–0.5)
Eosinophils Relative: 3 %
HCT: 27 % — ABNORMAL LOW (ref 39.0–52.0)
Hemoglobin: 10.1 g/dL — ABNORMAL LOW (ref 13.0–17.0)
Immature Granulocytes: 0 %
Lymphocytes Relative: 2 %
Lymphs Abs: 0.1 10*3/uL — ABNORMAL LOW (ref 0.7–4.0)
MCH: 34.1 pg — ABNORMAL HIGH (ref 26.0–34.0)
MCHC: 37.4 g/dL — ABNORMAL HIGH (ref 30.0–36.0)
MCV: 91.2 fL (ref 80.0–100.0)
Monocytes Absolute: 0.4 10*3/uL (ref 0.1–1.0)
Monocytes Relative: 6 %
Neutro Abs: 6.5 10*3/uL (ref 1.7–7.7)
Neutrophils Relative %: 88 %
Platelet Count: 367 10*3/uL (ref 150–400)
RBC: 2.96 MIL/uL — ABNORMAL LOW (ref 4.22–5.81)
RDW: 15.9 % — ABNORMAL HIGH (ref 11.5–15.5)
WBC Count: 7.4 10*3/uL (ref 4.0–10.5)
nRBC: 1 % — ABNORMAL HIGH (ref 0.0–0.2)

## 2019-10-11 MED ORDER — DARATUMUMAB-HYALURONIDASE-FIHJ 1800-30000 MG-UT/15ML ~~LOC~~ SOLN
1800.0000 mg | Freq: Once | SUBCUTANEOUS | Status: AC
  Administered 2019-10-11: 1800 mg via SUBCUTANEOUS
  Filled 2019-10-11: qty 15

## 2019-10-11 MED ORDER — SODIUM CHLORIDE 0.9% FLUSH
10.0000 mL | INTRAVENOUS | Status: DC | PRN
  Filled 2019-10-11: qty 10

## 2019-10-11 MED ORDER — DIPHENHYDRAMINE HCL 25 MG PO CAPS
ORAL_CAPSULE | ORAL | Status: AC
  Filled 2019-10-11: qty 2

## 2019-10-11 MED ORDER — DIPHENHYDRAMINE HCL 25 MG PO CAPS
50.0000 mg | ORAL_CAPSULE | Freq: Once | ORAL | Status: AC
  Administered 2019-10-11: 50 mg via ORAL

## 2019-10-11 MED ORDER — PROCHLORPERAZINE MALEATE 10 MG PO TABS
ORAL_TABLET | ORAL | Status: AC
  Filled 2019-10-11: qty 1

## 2019-10-11 MED ORDER — PROCHLORPERAZINE MALEATE 10 MG PO TABS
10.0000 mg | ORAL_TABLET | Freq: Once | ORAL | Status: AC
  Administered 2019-10-11: 10 mg via ORAL

## 2019-10-11 MED ORDER — ACETAMINOPHEN 325 MG PO TABS
650.0000 mg | ORAL_TABLET | Freq: Once | ORAL | Status: AC
  Administered 2019-10-11: 650 mg via ORAL

## 2019-10-11 MED ORDER — MONTELUKAST SODIUM 10 MG PO TABS
10.0000 mg | ORAL_TABLET | Freq: Once | ORAL | Status: AC
  Administered 2019-10-11: 10 mg via ORAL

## 2019-10-11 MED ORDER — ACETAMINOPHEN 325 MG PO TABS
ORAL_TABLET | ORAL | Status: AC
  Filled 2019-10-11: qty 2

## 2019-10-11 MED ORDER — MONTELUKAST SODIUM 10 MG PO TABS
ORAL_TABLET | ORAL | Status: AC
  Filled 2019-10-11: qty 1

## 2019-10-11 MED ORDER — BORTEZOMIB CHEMO SQ INJECTION 3.5 MG (2.5MG/ML)
1.5000 mg/m2 | Freq: Once | INTRAMUSCULAR | Status: AC
  Administered 2019-10-11: 3 mg via SUBCUTANEOUS
  Filled 2019-10-11: qty 1.2

## 2019-10-11 NOTE — Telephone Encounter (Signed)
.  Transition of Care Contact from [place]   Date of Discharge: 10/07/19  Date of Contact: 10/11/19 Method of contact: phone Talked to patient   Patient contacted to discuss transition of care form recent hospitaliztion. Patient was admitted to [name] from 1/26 to 1/29 with the discharge diagnosis of uremia, hypotension.     Medication changes were reviewed - lopressor stopped, amlodipine had been d/c previously.    Patient will follow up with is outpatient dialysis center yesterday on 10/10/19 and again tomorrow 10/12/19.   Other follow up needs include eval completed today for continued home PT.    Jen Mow, PA-C [facility] Pager: [phone]

## 2019-10-11 NOTE — Progress Notes (Signed)
LATE ENTRY-Received a call earlier for a critical value of creatinine 9.3.  Called Dr. Alen Blew at his desk to deliver the value and he noted it and stated that the patient is on dialysis and he expected this result. Gardiner Rhyme, RN

## 2019-10-11 NOTE — Progress Notes (Signed)
Patient took steroids at home before treatment.  Per Dr. Alen Blew patient is OK to leave after Faspro, and not stay for observation period. Patient discharged in stable condition. VSS.

## 2019-10-11 NOTE — Chronic Care Management (AMB) (Signed)
  Chronic Care Management   Outreach Note  10/11/2019 Name: Isaiah [name] [facility] MRN: [id number] DOB: 11-22-1965  Referred by: [name] Liaison Reason for referral : Care Coordination   An unsuccessful telephone outreach was attempted today. The patient was referred to the case management team by for assistance with care management and care coordination.   Follow Up Plan: A HIPPA compliant phone message was left for the patient providing contact information and requesting a return call.  The care management team will reach out to the patient again over the next 14 days.   [name], BSW, CDP Social Worker, Certified Dementia Practitioner [place] [facility] [phone]

## 2019-10-11 NOTE — Patient Instructions (Signed)
[facility] Discharge Instructions for Patients Receiving Chemotherapy  Today you received the following chemotherapy agents: Darzalex Faspro and Velcade.  To help prevent nausea and vomiting after your treatment, we encourage you to take your nausea medication as directed. If you develop nausea and vomiting that is not controlled by your nausea medication, call the clinic.   BELOW ARE SYMPTOMS THAT SHOULD BE REPORTED IMMEDIATELY:  *FEVER GREATER THAN 100.5 F  *CHILLS WITH OR WITHOUT FEVER  NAUSEA AND VOMITING THAT IS NOT CONTROLLED WITH YOUR NAUSEA MEDICATION  *UNUSUAL SHORTNESS OF BREATH  *UNUSUAL BRUISING OR BLEEDING  TENDERNESS IN MOUTH AND THROAT WITH OR WITHOUT PRESENCE OF ULCERS  *URINARY PROBLEMS  *BOWEL PROBLEMS  UNUSUAL RASH Items with * indicate a potential emergency and should be followed up as soon as possible.  Feel free to call the clinic you have any questions or concerns. The clinic phone number is [phone].  Please show the [facility] at check-in to the Emergency Department and triage nurse.  Coronavirus (COVID-19) Are you at risk?  Are you at risk for the Coronavirus (COVID-19)?  To be considered HIGH RISK for Coronavirus (COVID-19), you have to meet the following criteria:  . Traveled to [place], [place], [place], [place] or [place]; or in the [place] to [place], [place], [name], or [place]; and have fever, cough, and shortness of breath within the last 2 weeks of travel OR . Been in close contact with a person diagnosed with COVID-19 within the last 2 weeks and have fever, cough, and shortness of breath . IF YOU DO NOT MEET THESE CRITERIA, YOU ARE CONSIDERED LOW RISK FOR COVID-19.  What to do if you are HIGH RISK for COVID-19?  [name] If you are having a medical emergency, call 911. . Seek medical care right away. Before you go to a doctor's office, urgent care or emergency department, call ahead and tell them about  your recent travel, contact with someone diagnosed with COVID-19, and your symptoms. You should receive instructions from your physician's office regarding next steps of care.  . When you arrive at healthcare provider, tell the healthcare staff immediately you have returned from visiting [place], [place], [place], [place] or [place]; or traveled in the [place] to [place], [place], [place], or [place]; in the last two weeks or you have been in close contact with a person diagnosed with COVID-19 in the last 2 weeks.   . Tell the health care staff about your symptoms: fever, cough and shortness of breath. . After you have been seen by a medical provider, you will be either: o Tested for (COVID-19) and discharged home on quarantine except to seek medical care if symptoms worsen, and asked to  - Stay home and avoid contact with others until you get your results (4-5 days)  - Avoid travel on public transportation if possible (such as bus, train, or airplane) or o Sent to the Emergency Department by EMS for evaluation, COVID-19 testing, and possible admission depending on your condition and test results.  What to do if you are LOW RISK for COVID-19?  Reduce your risk of any infection by using the same precautions used for avoiding the common cold or flu:  [name] Wash your hands often with soap and warm water for at least 20 seconds.  If soap and water are not readily available, use an alcohol-based hand sanitizer with at least 60% alcohol.  . If coughing or sneezing,  cover your mouth and nose by coughing or sneezing into the elbow areas of your shirt or coat, into a tissue or into your sleeve (not your hands). . Avoid shaking hands with others and consider head nods or verbal greetings only. . Avoid touching your eyes, nose, or mouth with unwashed hands.  . Avoid close contact with people who are sick. . Avoid places or events with large numbers of people in one location, like concerts or sporting  events. . Carefully consider travel plans you have or are making. . If you are planning any travel outside or inside the [place], visit the CDC's Travelers' Health webpage for the latest health notices. . If you have some symptoms but not all symptoms, continue to monitor at home and seek medical attention if your symptoms worsen. . If you are having a medical emergency, call 911.   [place] / e-Visit: [email or web address]         [facility]: [facility]: [phone]                   [facility]: [phone]

## 2019-10-12 DIAGNOSIS — D631 Anemia in chronic kidney disease: Secondary | ICD-10-CM | POA: Diagnosis not present

## 2019-10-12 DIAGNOSIS — Z992 Dependence on renal dialysis: Secondary | ICD-10-CM | POA: Diagnosis not present

## 2019-10-12 DIAGNOSIS — D509 Iron deficiency anemia, unspecified: Secondary | ICD-10-CM | POA: Diagnosis not present

## 2019-10-12 DIAGNOSIS — N186 End stage renal disease: Secondary | ICD-10-CM | POA: Diagnosis not present

## 2019-10-12 DIAGNOSIS — E1129 Type 2 diabetes mellitus with other diabetic kidney complication: Secondary | ICD-10-CM | POA: Diagnosis not present

## 2019-10-12 DIAGNOSIS — N2581 Secondary hyperparathyroidism of renal origin: Secondary | ICD-10-CM | POA: Diagnosis not present

## 2019-10-12 LAB — KAPPA/LAMBDA LIGHT CHAINS
Kappa free light chain: 124.7 mg/L — ABNORMAL HIGH (ref 3.3–19.4)
Kappa, lambda light chain ratio: 2.43 — ABNORMAL HIGH (ref 0.26–1.65)
Lambda free light chains: 51.3 mg/L — ABNORMAL HIGH (ref 5.7–26.3)

## 2019-10-13 ENCOUNTER — Other Ambulatory Visit: Payer: Self-pay

## 2019-10-13 ENCOUNTER — Telehealth: Payer: Medicare Other

## 2019-10-13 ENCOUNTER — Ambulatory Visit: Payer: Self-pay

## 2019-10-13 ENCOUNTER — Ambulatory Visit (INDEPENDENT_AMBULATORY_CARE_PROVIDER_SITE_OTHER): Payer: Medicare Other

## 2019-10-13 DIAGNOSIS — I69351 Hemiplegia and hemiparesis following cerebral infarction affecting right dominant side: Secondary | ICD-10-CM | POA: Diagnosis not present

## 2019-10-13 DIAGNOSIS — N186 End stage renal disease: Secondary | ICD-10-CM | POA: Diagnosis not present

## 2019-10-13 DIAGNOSIS — E1122 Type 2 diabetes mellitus with diabetic chronic kidney disease: Secondary | ICD-10-CM | POA: Diagnosis not present

## 2019-10-13 DIAGNOSIS — C9000 Multiple myeloma not having achieved remission: Secondary | ICD-10-CM

## 2019-10-13 DIAGNOSIS — I120 Hypertensive chronic kidney disease with stage 5 chronic kidney disease or end stage renal disease: Secondary | ICD-10-CM | POA: Diagnosis not present

## 2019-10-13 DIAGNOSIS — I69320 Aphasia following cerebral infarction: Secondary | ICD-10-CM | POA: Diagnosis not present

## 2019-10-13 LAB — MULTIPLE MYELOMA PANEL, SERUM
Albumin SerPl Elph-Mcnc: 4.2 g/dL (ref 2.9–4.4)
Albumin/Glob SerPl: 1.7 (ref 0.7–1.7)
Alpha 1: 0.3 g/dL (ref 0.0–0.4)
Alpha2 Glob SerPl Elph-Mcnc: 0.7 g/dL (ref 0.4–1.0)
B-Globulin SerPl Elph-Mcnc: 0.8 g/dL (ref 0.7–1.3)
Gamma Glob SerPl Elph-Mcnc: 0.8 g/dL (ref 0.4–1.8)
Globulin, Total: 2.5 g/dL (ref 2.2–3.9)
IgA: 28 mg/dL — ABNORMAL LOW (ref 90–386)
IgG (Immunoglobin G), Serum: 783 mg/dL (ref 603–1613)
IgM (Immunoglobulin M), Srm: 20 mg/dL (ref 20–172)
M Protein SerPl Elph-Mcnc: 0.2 g/dL — ABNORMAL HIGH
Total Protein ELP: 6.7 g/dL (ref 6.0–8.5)

## 2019-10-13 NOTE — Chronic Care Management (AMB) (Signed)
Care Management    Consult Note  10/13/2019 Name: Isaiah [name] [facility] MRN: [id number] DOB: [birth year]  Care management team received notification of patient's recent inpatient hospitalization related to DMII, ESRD and multiple myeloma .Based on review of health record, [place] is not currently enrolled in the CCM program but is eligible for CCM services based on qualifying diagnoses and visit with PCP in the last 12 months. . Outbound call placed to the patient's spouse and caregiver, Isaiah [name], to assist with care coordination needs.   Review of patient status, including review of consultants reports, relevant laboratory and other test results, and collaboration with appropriate care team members and the patient's provider was performed as part of comprehensive patient evaluation and provision of chronic care management services.     Isaiah [name] was given information about Chronic Care Management services in relation to patient services today including:  1. CCM service includes personalized support from designated clinical staff supervised by his physician, including individualized plan of care and coordination with other care providers 2. 24/7 contact phone numbers for assistance for urgent and routine care needs. 3. Service will only be billed when office clinical staff spend 20 minutes or more in a month to coordinate care. 4. Only one practitioner may furnish and bill the service in a calendar month. 5. The patient may stop CCM services at any time (effective at the end of the month) by phone call to the office staff. 6. The patient will be responsible for cost sharing (co-pay) of up to 20% of the service fee (after annual deductible is met).  Isaiah [name] agreed to services on behalf of the patient and verbal consent obtained.     Goals Addressed            This Visit's Progress     Patient Stated   . "I would like caregiver assistance on Tuesday's and Thursday's when he is not at  Dialysis" (pt-stated)       Isaiah [name] Stated:  Current Barriers:  . Financial constraints related to cost of private duty caregivers . ADL IADL limitations . Limited knowledge of community resources to assist with care assistance  Social Work Clinical Goal(s):  [name] Over the next 60 days the patients and his wife will work with SW to assist with caregiver resource needs  CCM SW Interventions: Completed 10/13/19 with [facility] . Successful outbound call to [name] the patients spouse and caregiver . Determined Isaiah [name] currently works from home most days of the week and is interested in caregiver assistance on Tuesday and Thursday  o The patient is not eligible to receive Medicaid due to spouse income o Reviewed financial barriers with hiring a private duty caregiver  o Educated Isaiah [name] on the CAP/DA program - Placed referral to Isaiah [name] CAP/DA administrative assistance to initiate CAP/DA application  Patient Self Care Activities:  . Self administers medications as prescribed . Calls pharmacy for medication refills . Calls provider office for new concerns or questions . Supportive spouse to assist with ongoing care needs   Initial goal documentation       Other   . Collaborate with RN Care Manager to perform apprpriate assessments to determine care management and care coordination needs       Current Barriers:  . Care management and care coordination needs related to DMII, ESRD, and multiple myeloma  which resulted in inpatient hospitalization . ADL IADL limitations  Clinical Social Work Clinical Goal(s):  .  Over the next 60 days, patient will collaborate with care management team to assist with care coordination needs in response to recent inpatient hospitalization  CCM SW Interventions: Completed 10/13/19 with spouse [name] . Collaboration with [name] liaison indicating patient recent discharge home following inpatient stay . Successful outbound  call placed to the patients spouse, Isaiah [name] to review care management program o Enrolled the patient in embedded care management o Scheduled follow up call for the afternoon of Friday 10/14/19 to accomodate Isaiah [name]'s work schedule o Discussed plan to work with care management team to assist with care coordination needs  Patient Self Care Activities:  . Attends all scheduled provider appointments . Calls pharmacy for medication refills . Calls provider office for new concerns or questions . Supportive wife and caregiver to assist with patient care needs   Initial goal documentation       Plan: Appointment scheduled for SW follow up with client by phone on: 10/14/19  [name], [place], CDP Social Worker, Certified Dementia Practitioner [place] [facility] [place] [facility] [phone]  Total time spent performing care coordination and/or care management activities with the patient by phone or face to face = 15 minutes.

## 2019-10-13 NOTE — Chronic Care Management (AMB) (Signed)
Chronic Care Management   Initial Visit Note  10/13/2019 Name: Isaiah [name] MRN: [id number] DOB: 01-23-1966  Referred by: [name], MD Reason for referral : Chronic Care Management (CCM RNCM Case Collaboration )   Isaiah [name] is a 54 y.o. year old male who is a primary care patient of [name], MD. The care management team was consulted for assistance with chronic disease management and care coordination needs related to ESRD and Chemotherapy; Hemodialysis.  Review of patient status, including review of consultants reports, relevant laboratory and other test results, and collaboration with appropriate care team members and the patient's provider was performed as part of comprehensive patient evaluation and provision of chronic care management services.    I initiated and established the plan of care for [facility] during one on one collaboration with my clinical care management colleague [name] BSW who is also engaged with this patient to address social work needs.   Outpatient Encounter Medications as of 10/13/2019  Medication Sig Note  . acetaminophen (TYLENOL) 325 MG tablet Take 1-2 tablets (325-650 mg total) by mouth every 4 (four) hours as needed for mild pain.   [name] acyclovir (ZOVIRAX) 400 MG tablet Take 400 mg by mouth at bedtime.   [name] albuterol (PROAIR HFA) 108 (90 Base) MCG/ACT inhaler Inhale two puffs every 4-6 hours if needed for cough or wheeze   . calcium carbonate (TUMS - DOSED IN MG ELEMENTAL CALCIUM) 500 MG chewable tablet Chew 2 tablets by mouth at bedtime. 10/04/2019: On hold per MD  . camphor-menthol (SARNA) lotion Apply 1 application topically every 8 (eight) hours as needed for itching.   . Cetirizine HCl 10 MG CAPS Take 1 capsule by mouth daily.   . cinacalcet (SENSIPAR) 60 MG tablet Take 60 mg by mouth every evening. ON HOLD FOR DIALYSIS PER WIFE   . clonazePAM (KLONOPIN) 0.5 MG tablet Take 2 tablets every Monday, Wednesday, and Friday with  hemodialysis   . Daratumumab-Hyaluronidase-fihj (DARZALEX FASPRO  [place]) Inject 1 Dose into the skin once a week. On Tuesday   . dexamethasone (DECADRON) 4 MG tablet Take 5 tablets (20 mg total) by mouth once a week. Take 5 tablets (20 mg total) by mouth once a week on day of chemo.   . fluticasone furoate-vilanterol (BREO ELLIPTA) 200-25 MCG/INH AEPB Inhale 1 puff into the lungs daily.   . hydrOXYzine (ATARAX/VISTARIL) 25 MG tablet Take 1 tablet (25 mg total) by mouth every 8 (eight) hours as needed for itching.   . Insulin Lispro (HUMALOG KWIKPEN [place]) Inject 2-4 Units into the skin as needed (to control Glucose). Sliding scale: 175-200 =2 units, 201-250=3 units,-251 += 4 units   . ketorolac (ACULAR) 0.5 % ophthalmic solution Place 1 drop into both eyes 4 (four) times daily.   [name] lacosamide (VIMPAT) 200 MG TABS tablet Take 1 tablet twice a day except on dialysis days M-W-F give 1 tab in AM, 1 tab after dialysis, 1 tab in PM.   . lamoTRIgine (LAMICTAL) 150 MG tablet Take 1 tablet (150 mg total) by mouth 2 (two) times daily.   . multivitamin (RENA-VIT) TABS tablet Take 1 tablet by mouth at bedtime.   . Nutritional Supplements (FEEDING SUPPLEMENT, NEPRO CARB STEADY,) LIQD Take 237 mLs by mouth 3 (three) times daily as needed (Supplement).   [name] ofloxacin (OCUFLOX) 0.3 % ophthalmic solution Place 1 drop into the left eye 4 (four) times daily.   . pantoprazole (PROTONIX) 40 MG tablet Take 40 mg by mouth  daily.   . prednisoLONE acetate (PRED FORTE) 1 % ophthalmic suspension Place 1 drop into the left eye 3 (three) times daily.    . prochlorperazine (COMPAZINE) 10 MG tablet TAKE 1 TABLET (10 MG TOTAL) BY MOUTH EVERY 6 (SIX) HOURS AS NEEDED FOR NAUSEA OR VOMITING.    No facility-administered encounter medications on file as of 10/13/2019.     Goals Addressed    . Assist with Chronic Care Management and Care Coordination needs       Current Barriers:  [name] Knowledge Barriers related to resources and support  available to address needs related to Chronic Care Management and Community Resources  Case Manager Clinical Goal(s):  [name] Over the next 60 days, patient will work with the CCM team to address needs related to disease education and support of chronic health conditions  Interventions:  . Collaborated with BSW and initiated plan of care to address needs related to Chronic Care Management and Care Coordination needs  Patient Self Care Activities:  . Attends all scheduled provider appointments . Calls pharmacy for medication refills . Calls provider office for new concerns or questions  Initial goal documentation         Telephone follow up appointment with care management team member scheduled for: 11/18/19  Barb Merino, RN, BSN, CCM Care Management Coordinator [place] [facility]  Direct Phone: [phone]

## 2019-10-13 NOTE — Patient Instructions (Signed)
Social Worker Visit Information  Goals we discussed today:  Goals Addressed            This Visit's Progress     Patient Stated   . "I would like caregiver assistance on Tuesday's and Thursday's when he is not at Dialysis" (pt-stated)       Isaiah [name] Stated:  Current Barriers:  . Financial constraints related to cost of private duty caregivers . ADL IADL limitations . Limited knowledge of community resources to assist with care assistance  Social Work Clinical Goal(s):  [name] Over the next 60 days the patients and his wife will work with SW to assist with caregiver resource needs  CCM SW Interventions: Completed 10/13/19 with [facility] . Successful outbound call to [facility] the patients spouse and caregiver . Determined Isaiah [name] currently works from home most days of the week and is interested in caregiver assistance on Tuesday and Thursday  o The patient is not eligible to receive Medicaid due to spouse income o Reviewed financial barriers with hiring a private duty caregiver  o Educated Isaiah [name] on the CAP/DA program - Placed referral to Alicia Amel CAP/DA administrative assistance to initiate CAP/DA application  Patient Self Care Activities:  . Self administers medications as prescribed . Calls pharmacy for medication refills . Calls provider office for new concerns or questions . Supportive spouse to assist with ongoing care needs   Initial goal documentation       Other   . Collaborate with RN Care Manager to perform apprpriate assessments to determine care management and care coordination needs       Current Barriers:  . Care management and care coordination needs related to DMII, ESRD, and multiple myeloma  which resulted in inpatient hospitalization . ADL IADL limitations  Clinical Social Work Clinical Goal(s):  [name] Over the next 60 days, patient will collaborate with care management team to assist with care coordination needs in response to  recent inpatient hospitalization  CCM SW Interventions: Completed 10/13/19 with spouse Isaiah [name] . Collaboration with [name] liaison indicating patient recent discharge home following inpatient stay . Successful outbound call placed to the patients spouse, Isaiah [name] to review care management program o Enrolled the patient in embedded care management o Scheduled follow up call for the afternoon of Friday 10/14/19 to accomodate Isaiah [name]'s work schedule o Discussed plan to work with care management team to assist with care coordination needs  Patient Self Care Activities:  . Attends all scheduled provider appointments . Calls pharmacy for medication refills . Calls provider office for new concerns or questions . Supportive wife and caregiver to assist with patient care needs   Initial goal documentation        Materials provided: Verbal education about CM program provided by phone  Isaiah [name] was given information about Chronic Care Management services today including:  1. CCM service includes personalized support from designated clinical staff supervised by his physician, including individualized plan of care and coordination with other care providers 2. 24/7 contact phone numbers for assistance for urgent and routine care needs. 3. Service will only be billed when office clinical staff spend 20 minutes or more in a month to coordinate care. 4. Only one practitioner may furnish and bill the service in a calendar month. 5. The patient may stop CCM services at any time (effective at the end of the month) by phone call to the office staff. 6. The patient will be responsible for cost sharing (  co-pay) of up to 20% of the service fee (after annual deductible is met).  Patient agreed to services and verbal consent obtained.   The patient verbalized understanding of instructions provided today and declined a print copy of patient instruction materials.   Follow up plan: Appointment  scheduled for SW follow up with client by phone on: 10/14/19  Isaiah [name], [place], CDP Social Worker, Certified Dementia Practitioner [place] [facility] [phone]

## 2019-10-14 ENCOUNTER — Encounter: Payer: Self-pay | Admitting: Cardiovascular Disease

## 2019-10-14 ENCOUNTER — Telehealth: Payer: Self-pay

## 2019-10-14 ENCOUNTER — Ambulatory Visit: Payer: Self-pay

## 2019-10-14 DIAGNOSIS — E1122 Type 2 diabetes mellitus with diabetic chronic kidney disease: Secondary | ICD-10-CM

## 2019-10-14 DIAGNOSIS — N186 End stage renal disease: Secondary | ICD-10-CM | POA: Diagnosis not present

## 2019-10-14 DIAGNOSIS — D631 Anemia in chronic kidney disease: Secondary | ICD-10-CM | POA: Diagnosis not present

## 2019-10-14 DIAGNOSIS — C9000 Multiple myeloma not having achieved remission: Secondary | ICD-10-CM

## 2019-10-14 DIAGNOSIS — D509 Iron deficiency anemia, unspecified: Secondary | ICD-10-CM | POA: Diagnosis not present

## 2019-10-14 DIAGNOSIS — N2581 Secondary hyperparathyroidism of renal origin: Secondary | ICD-10-CM | POA: Diagnosis not present

## 2019-10-14 DIAGNOSIS — E1129 Type 2 diabetes mellitus with other diabetic kidney complication: Secondary | ICD-10-CM | POA: Diagnosis not present

## 2019-10-14 DIAGNOSIS — Z992 Dependence on renal dialysis: Secondary | ICD-10-CM | POA: Diagnosis not present

## 2019-10-14 NOTE — Telephone Encounter (Signed)
Transition Care Management Follow-up Telephone Call  Date of discharge and from where: 10/07/2019 [facility]  How have you been since you were released from the hospital? He has been fine Any questions or concerns? The only concern is should he take a covid vaccination and Mrs. [facility] said that she is also going to ask his dialysis doctor Items Reviewed:  Did the pt receive and understand the discharge instructions provided? Yes  Medications obtained and verified? Yes  Any new allergies since your discharge? No  Dietary orders reviewed? n/a  Do you have support at home? Yes  Other (ie: DME, Home Health, etc) Yes  Functional Questionnaire: (I = Independent and D = Dependent) ADL's: D  Bathing/Dressing- D   Meal Prep- D  Eating- D  Maintaining continence- D  Transferring/Ambulation- D  Managing Meds- D   Follow up appointments reviewed:    PCP Hospital f/u appt confirmed? Yes Scheduled to see Dr. Baird Cancer on 10/20/2019 @   [facility] f/u appt confirmed? n/a  Are transportation arrangements needed? no  If their condition worsens, is the pt aware to call  their PCP or go to the ED? yes  Was the patient provided with contact information for the PCP's office or ED? yes Was the pt encouraged to call back with questions or concerns? yes

## 2019-10-14 NOTE — Chronic Care Management (AMB) (Signed)
  Chronic Care Management   Outreach Note  10/14/2019 Name: Isaiah [name] [facility] MRN: [id number] DOB: 1966/05/22  Referred by: [name], MD Reason for referral : Care Coordination   SW placed an unsuccessful outbound call to the patients spouse, [facility], to assist with care coordination needs. SW left a HIPAA compliant voice message requesting a return call.  Follow Up Plan: The care management team will reach out to the patient again over the next 14 days.   [name], BSW, CDP Social Worker, Certified Dementia Practitioner [place] [facility] [place] [facility] [phone]

## 2019-10-15 DIAGNOSIS — R2689 Other abnormalities of gait and mobility: Secondary | ICD-10-CM | POA: Diagnosis not present

## 2019-10-15 DIAGNOSIS — R269 Unspecified abnormalities of gait and mobility: Secondary | ICD-10-CM | POA: Diagnosis not present

## 2019-10-15 DIAGNOSIS — G8110 Spastic hemiplegia affecting unspecified side: Secondary | ICD-10-CM | POA: Diagnosis not present

## 2019-10-15 DIAGNOSIS — G40909 Epilepsy, unspecified, not intractable, without status epilepticus: Secondary | ICD-10-CM | POA: Diagnosis not present

## 2019-10-15 DIAGNOSIS — F445 Conversion disorder with seizures or convulsions: Secondary | ICD-10-CM | POA: Diagnosis not present

## 2019-10-17 ENCOUNTER — Encounter: Payer: Self-pay | Admitting: *Deleted

## 2019-10-17 DIAGNOSIS — N186 End stage renal disease: Secondary | ICD-10-CM | POA: Diagnosis not present

## 2019-10-17 DIAGNOSIS — E1129 Type 2 diabetes mellitus with other diabetic kidney complication: Secondary | ICD-10-CM | POA: Diagnosis not present

## 2019-10-17 DIAGNOSIS — Z992 Dependence on renal dialysis: Secondary | ICD-10-CM | POA: Diagnosis not present

## 2019-10-17 DIAGNOSIS — N2581 Secondary hyperparathyroidism of renal origin: Secondary | ICD-10-CM | POA: Diagnosis not present

## 2019-10-17 DIAGNOSIS — D509 Iron deficiency anemia, unspecified: Secondary | ICD-10-CM | POA: Diagnosis not present

## 2019-10-17 DIAGNOSIS — D631 Anemia in chronic kidney disease: Secondary | ICD-10-CM | POA: Diagnosis not present

## 2019-10-18 ENCOUNTER — Inpatient Hospital Stay: Payer: Medicare Other

## 2019-10-18 ENCOUNTER — Other Ambulatory Visit: Payer: Self-pay

## 2019-10-18 ENCOUNTER — Inpatient Hospital Stay (HOSPITAL_BASED_OUTPATIENT_CLINIC_OR_DEPARTMENT_OTHER): Payer: Medicare Other | Admitting: Oncology

## 2019-10-18 VITALS — BP 146/47 | HR 80 | Temp 98.7°F | Resp 18 | Ht 70.0 in | Wt 185.6 lb

## 2019-10-18 DIAGNOSIS — C9000 Multiple myeloma not having achieved remission: Secondary | ICD-10-CM

## 2019-10-18 DIAGNOSIS — I120 Hypertensive chronic kidney disease with stage 5 chronic kidney disease or end stage renal disease: Secondary | ICD-10-CM | POA: Diagnosis not present

## 2019-10-18 DIAGNOSIS — N186 End stage renal disease: Secondary | ICD-10-CM | POA: Diagnosis not present

## 2019-10-18 DIAGNOSIS — E1122 Type 2 diabetes mellitus with diabetic chronic kidney disease: Secondary | ICD-10-CM | POA: Diagnosis not present

## 2019-10-18 DIAGNOSIS — I69351 Hemiplegia and hemiparesis following cerebral infarction affecting right dominant side: Secondary | ICD-10-CM | POA: Diagnosis not present

## 2019-10-18 DIAGNOSIS — I69320 Aphasia following cerebral infarction: Secondary | ICD-10-CM | POA: Diagnosis not present

## 2019-10-18 DIAGNOSIS — Z5111 Encounter for antineoplastic chemotherapy: Secondary | ICD-10-CM | POA: Diagnosis not present

## 2019-10-18 MED ORDER — MONTELUKAST SODIUM 10 MG PO TABS
ORAL_TABLET | ORAL | Status: AC
  Filled 2019-10-18: qty 1

## 2019-10-18 MED ORDER — DARATUMUMAB-HYALURONIDASE-FIHJ 1800-30000 MG-UT/15ML ~~LOC~~ SOLN
1800.0000 mg | Freq: Once | SUBCUTANEOUS | Status: AC
  Administered 2019-10-18: 1800 mg via SUBCUTANEOUS
  Filled 2019-10-18: qty 15

## 2019-10-18 MED ORDER — PROCHLORPERAZINE MALEATE 10 MG PO TABS
ORAL_TABLET | ORAL | Status: AC
  Filled 2019-10-18: qty 1

## 2019-10-18 MED ORDER — ACETAMINOPHEN 325 MG PO TABS
ORAL_TABLET | ORAL | Status: AC
  Filled 2019-10-18: qty 2

## 2019-10-18 MED ORDER — PROCHLORPERAZINE MALEATE 10 MG PO TABS
10.0000 mg | ORAL_TABLET | Freq: Once | ORAL | Status: AC
  Administered 2019-10-18: 10 mg via ORAL

## 2019-10-18 MED ORDER — MONTELUKAST SODIUM 10 MG PO TABS
10.0000 mg | ORAL_TABLET | Freq: Once | ORAL | Status: AC
  Administered 2019-10-18: 10 mg via ORAL

## 2019-10-18 MED ORDER — DIPHENHYDRAMINE HCL 25 MG PO CAPS
50.0000 mg | ORAL_CAPSULE | Freq: Once | ORAL | Status: AC
  Administered 2019-10-18: 50 mg via ORAL

## 2019-10-18 MED ORDER — BORTEZOMIB CHEMO SQ INJECTION 3.5 MG (2.5MG/ML)
1.5000 mg/m2 | Freq: Once | INTRAMUSCULAR | Status: AC
  Administered 2019-10-18: 3 mg via SUBCUTANEOUS
  Filled 2019-10-18: qty 1.2

## 2019-10-18 MED ORDER — ACETAMINOPHEN 325 MG PO TABS
650.0000 mg | ORAL_TABLET | Freq: Once | ORAL | Status: AC
  Administered 2019-10-18: 650 mg via ORAL

## 2019-10-18 MED ORDER — DIPHENHYDRAMINE HCL 25 MG PO CAPS
ORAL_CAPSULE | ORAL | Status: AC
  Filled 2019-10-18: qty 2

## 2019-10-18 NOTE — Patient Instructions (Signed)
[facility] Discharge Instructions for Patients Receiving Chemotherapy  Today you received the following chemotherapy agents: Darzalex Faspro and Velcade.  To help prevent nausea and vomiting after your treatment, we encourage you to take your nausea medication as directed. If you develop nausea and vomiting that is not controlled by your nausea medication, call the clinic.   BELOW ARE SYMPTOMS THAT SHOULD BE REPORTED IMMEDIATELY:  *FEVER GREATER THAN 100.5 F  *CHILLS WITH OR WITHOUT FEVER  NAUSEA AND VOMITING THAT IS NOT CONTROLLED WITH YOUR NAUSEA MEDICATION  *UNUSUAL SHORTNESS OF BREATH  *UNUSUAL BRUISING OR BLEEDING  TENDERNESS IN MOUTH AND THROAT WITH OR WITHOUT PRESENCE OF ULCERS  *URINARY PROBLEMS  *BOWEL PROBLEMS  UNUSUAL RASH Items with * indicate a potential emergency and should be followed up as soon as possible.  Feel free to call the clinic you have any questions or concerns. The clinic phone number is [phone].  Please show the [place] at check-in to the Emergency Department and triage nurse.  Coronavirus (COVID-19) Are you at risk?  Are you at risk for the Coronavirus (COVID-19)?  To be considered HIGH RISK for Coronavirus (COVID-19), you have to meet the following criteria:  . Traveled to [place], [place], [place], [place] or [place]; or in the [place] to [place], [place], [place], or [place]; and have fever, cough, and shortness of breath within the last 2 weeks of travel OR . Been in close contact with a person diagnosed with COVID-19 within the last 2 weeks and have fever, cough, and shortness of breath . IF YOU DO NOT MEET THESE CRITERIA, YOU ARE CONSIDERED LOW RISK FOR COVID-19.  What to do if you are HIGH RISK for COVID-19?  [name] If you are having a medical emergency, call 911. . Seek medical care right away. Before you go to a doctor's office, urgent care or emergency department, call ahead and tell them about  your recent travel, contact with someone diagnosed with COVID-19, and your symptoms. You should receive instructions from your physician's office regarding next steps of care.  . When you arrive at healthcare provider, tell the healthcare staff immediately you have returned from visiting [place], [place], [place], [place] or [place]; or traveled in the [place] to [place], [place], [place], or [place]; in the last two weeks or you have been in close contact with a person diagnosed with COVID-19 in the last 2 weeks.   . Tell the health care staff about your symptoms: fever, cough and shortness of breath. . After you have been seen by a medical provider, you will be either: o Tested for (COVID-19) and discharged home on quarantine except to seek medical care if symptoms worsen, and asked to  - Stay home and avoid contact with others until you get your results (4-5 days)  - Avoid travel on public transportation if possible (such as bus, train, or airplane) or o Sent to the Emergency Department by EMS for evaluation, COVID-19 testing, and possible admission depending on your condition and test results.  What to do if you are LOW RISK for COVID-19?  Reduce your risk of any infection by using the same precautions used for avoiding the common cold or flu:  [name] Wash your hands often with soap and warm water for at least 20 seconds.  If soap and water are not readily available, use an alcohol-based hand sanitizer with at least 60% alcohol.  . If coughing or sneezing,  cover your mouth and nose by coughing or sneezing into the elbow areas of your shirt or coat, into a tissue or into your sleeve (not your hands). . Avoid shaking hands with others and consider head nods or verbal greetings only. . Avoid touching your eyes, nose, or mouth with unwashed hands.  . Avoid close contact with people who are sick. . Avoid places or events with large numbers of people in one location, like concerts or sporting  events. . Carefully consider travel plans you have or are making. . If you are planning any travel outside or inside the [place], visit the CDC's Travelers' Health webpage for the latest health notices. . If you have some symptoms but not all symptoms, continue to monitor at home and seek medical attention if your symptoms worsen. . If you are having a medical emergency, call 911.   [place] / e-Visit: [email or web address]         [facility]: [facility]: [phone]                   [facility]: [phone]

## 2019-10-18 NOTE — Progress Notes (Signed)
Per Dr. Alen Blew ok to use labs from 2/2.  Pt has taken dexamethasone at home prior to appointment.   No post observation per MD.

## 2019-10-18 NOTE — Progress Notes (Signed)
Hematology and Oncology Follow Up Visit  Kathleen [id number] 10-Apr-1966 54 y.o. 10/18/2019 10:44 AM    Principle Diagnosis: 54 year old man with multiple myeloma diagnosed in 2019.  He presented with IgG kappa MGUS in 2016 and transformed into symptomatic myeloma with 20% plasma cell infiltration.   Past therapy: Velcade dexamethasone and cyclophosphamide weekly started on 05/25/2018.  Velcade is given at 1.5 mg/m weekly, dexamethasone is 20 mg weekly and and Cytoxan is given at 650 mg total dose.  Last treatment was given in August 2020.    Therapy was held today in anticipation of high-dose chemotherapy and autologous stem cell transplant.  He did not receive stem cell transplant due to subdural hematoma and sepsis in September 2020.  Current therapy:  Velcade at 1.5 mg per metered squared subcutaneously weekly, dexamethasone 20 mg weekly, daratumumab 1800 mg subcutaneous injection weekly.  Cycle 1 started on 08/30/2019.  He is here for cycle 7 of therapy.  Interim History:  Mr. Amescua presents today for a follow-up evaluation.  Since the last visit, he did require brief hospitalization in the end of January for altered mental status and confusion.  Since his discharge, he reports feeling back to normal at this time with improved mobility.  He is ambulating with the help of a walker inside his house.  He denies any recent falls or syncope.  He denies any bone pain or pathological fractures.  He does report fluctuation in his blood pressure with episodic hypotension and his blood pressure medication has been completely discontinued.                       Medications: Reviewed today.  Current Outpatient Medications:  .  acetaminophen (TYLENOL) 325 MG tablet, Take 1-2 tablets (325-650 mg total) by mouth every 4 (four) hours as needed for mild pain., Disp:  , Rfl:  .  acyclovir (ZOVIRAX) 400 MG tablet, Take 400 mg by mouth at bedtime., Disp: , Rfl:  .  albuterol (PROAIR  HFA) 108 (90 Base) MCG/ACT inhaler, Inhale two puffs every 4-6 hours if needed for cough or wheeze, Disp: 1 Inhaler, Rfl: 1 .  calcium carbonate (TUMS - DOSED IN MG ELEMENTAL CALCIUM) 500 MG chewable tablet, Chew 2 tablets by mouth at bedtime., Disp: , Rfl:  .  camphor-menthol (SARNA) lotion, Apply 1 application topically every 8 (eight) hours as needed for itching., Disp: 222 mL, Rfl: 0 .  Cetirizine HCl 10 MG CAPS, Take 1 capsule by mouth daily., Disp: , Rfl:  .  cinacalcet (SENSIPAR) 60 MG tablet, Take 60 mg by mouth every evening. ON HOLD FOR DIALYSIS PER WIFE, Disp: , Rfl:  .  clonazePAM (KLONOPIN) 0.5 MG tablet, Take 2 tablets every Monday, Wednesday, and Friday with hemodialysis, Disp: 30 tablet, Rfl: 5 .  Daratumumab-Hyaluronidase-fihj (DARZALEX FASPRO [place]), Inject 1 Dose into the skin once a week. On Tuesday, Disp: , Rfl:  .  dexamethasone (DECADRON) 4 MG tablet, Take 5 tablets (20 mg total) by mouth once a week. Take 5 tablets (20 mg total) by mouth once a week on day of chemo., Disp: 40 tablet, Rfl: 3 .  fluticasone furoate-vilanterol (BREO ELLIPTA) 200-25 MCG/INH AEPB, Inhale 1 puff into the lungs daily., Disp: , Rfl:  .  hydrOXYzine (ATARAX/VISTARIL) 25 MG tablet, Take 1 tablet (25 mg total) by mouth every 8 (eight) hours as needed for itching., Disp: 30 tablet, Rfl: 0 .  Insulin Lispro (HUMALOG KWIKPEN ), Inject 2-4 Units into the skin  as needed (to control Glucose). Sliding scale: 175-200 =2 units, 201-250=3 units,-251 += 4 units, Disp: , Rfl:  .  ketorolac (ACULAR) 0.5 % ophthalmic solution, Place 1 drop into both eyes 4 (four) times daily., Disp: , Rfl:  .  lacosamide (VIMPAT) 200 MG TABS tablet, Take 1 tablet twice a day except on dialysis days M-W-F give 1 tab in AM, 1 tab after dialysis, 1 tab in PM., Disp: 90 tablet, Rfl: 5 .  lamoTRIgine (LAMICTAL) 150 MG tablet, Take 1 tablet (150 mg total) by mouth 2 (two) times daily., Disp: 180 tablet, Rfl: 3 .  multivitamin (RENA-VIT) TABS  tablet, Take 1 tablet by mouth at bedtime., Disp: , Rfl:  .  Nutritional Supplements (FEEDING SUPPLEMENT, NEPRO CARB STEADY,) LIQD, Take 237 mLs by mouth 3 (three) times daily as needed (Supplement)., Disp: 237 mL, Rfl: 12 .  ofloxacin (OCUFLOX) 0.3 % ophthalmic solution, Place 1 drop into the left eye 4 (four) times daily., Disp: , Rfl:  .  pantoprazole (PROTONIX) 40 MG tablet, Take 40 mg by mouth daily., Disp: , Rfl:  .  prednisoLONE acetate (PRED FORTE) 1 % ophthalmic suspension, Place 1 drop into the left eye 3 (three) times daily. , Disp: , Rfl:  .  prochlorperazine (COMPAZINE) 10 MG tablet, TAKE 1 TABLET (10 MG TOTAL) BY MOUTH EVERY 6 (SIX) HOURS AS NEEDED FOR NAUSEA OR VOMITING., Disp: 30 tablet, Rfl: 0  Allergies:  Allergies  Allergen Reactions  . Codeine Rash and Other (See Comments)    Unknown reaction (patient says it was more serious than just a rash, but he can't remember what happened) Unknown reaction (patient says it was more serious than just a rash, but he can't remember what happened)  . Codeine Swelling      Physical Exam:   Blood pressure (!) 146/47, pulse 80, temperature 98.7 F (37.1 C), temperature source Temporal, resp. rate 18, height _0  (1.778 m), weight 185 lb 9.6 oz (84.2 kg), SpO2 98 %.     ECOG: 1    General appearance: Comfortable appearing without any discomfort Head: Normocephalic without any trauma Oropharynx: Mucous membranes are moist and pink without any thrush or ulcers. Eyes: Pupils are equal and round reactive to light. Lymph nodes: No cervical, supraclavicular, inguinal or axillary lymphadenopathy.   Heart:regular rate and rhythm.  S1 and S2 without leg edema. Lung: Clear without any rhonchi or wheezes.  No dullness to percussion. Abdomin: Soft, nontender, nondistended with good bowel sounds.  No hepatosplenomegaly. Musculoskeletal: No joint deformity or effusion.  Full range of motion noted. Neurological: No deficits noted on  motor, sensory and deep tendon reflex exam. Skin: No petechial rash or dryness.  Appeared moist.  .                Lab Results: Lab Results  Component Value Date   WBC 7.4 10/11/2019   HGB 10.1 (L) 10/11/2019   HCT 27.0 (L) 10/11/2019   MCV 91.2 10/11/2019   PLT 367 10/11/2019     Chemistry      Component Value Date/Time   NA 139 10/11/2019 1311   NA 138 07/03/2017 0813   K 4.4 10/11/2019 1311   K 4.2 07/03/2017 0813   CL 96 (L) 10/11/2019 1311   CO2 31 10/11/2019 1311   CO2 27 07/03/2017 0813   BUN 33 (H) 10/11/2019 1311   BUN 44 (A) 08/17/2019 0000   BUN 46.4 (H) 07/03/2017 0813   CREATININE 9.30 (HH) 10/11/2019 1311  CREATININE 10.9 (HH) 07/03/2017 0813   GLU 145 08/17/2019 0000      Component Value Date/Time   CALCIUM 9.4 10/11/2019 1311   CALCIUM 9.3 07/03/2017 0813   ALKPHOS 98 10/11/2019 1311   ALKPHOS 77 07/03/2017 0813   AST 12 (L) 10/11/2019 1311   AST 16 07/03/2017 0813   ALT 9 10/11/2019 1311   ALT 14 07/03/2017 0813   BILITOT 0.9 10/11/2019 1311   BILITOT 0.93 07/03/2017 0813     Results for KAYN, HAYMORE (MRN [id number]) as of 10/18/2019 10:46  Ref. Range 08/09/2019 13:13 09/13/2019 13:11 10/11/2019 13:12  M Protein SerPl Elph-Mcnc Latest Ref Range: Not Observed g/dL Not Observed Not Observed 0.2 (H)     Impression and Plan:  54 year old man with:  1.  Multiple myeloma diagnosed in 2019 arising from MGUS, IgG kappa subtype.  He continues to tolerate the current therapy without any major complications.  Protein studies obtained on February 2 were reviewed today and showed a small M spike of 0.2 g/dL.  Risks and benefits of continuing this therapy long-term was discussed and he is agreeable to continue.  High-dose chemotherapy and stem cell transplant is currently deferred for the time being.    2. Anemia: Hemoglobin remains stable at this time without any intervention.  This is related to plasma cell disorder as well as renal  failure.   2.  Antiemetics: No nausea or vomiting reported at this time.  Compazine is available to him.  4.  Renal failure: He is dialysis dependent continues to receive it Monday Wednesday Friday.  5.  Weakness and debilitation: Slowly improving at this time with physical therapy.  6.  Goals of care: Therapy remains palliative at this time although his formal status is improving aggressive measures are warranted.  7.  Hypotension: He is off of blood pressure medication at this time.  He is hypotension has correlated with his dialysis day.  I asked him to in addition with his nephrologist so for clarification.  8. Follow-up: He will continue to receive chemotherapy weekly with MD follow-up in 4 weeks.  30 minutes was dedicated to this visit.  The time was spent on reviewing laboratory data, disease status update treatment options and future plan of care discussion.Zola Button, MD 2/9/202110:44 AM

## 2019-10-19 ENCOUNTER — Telehealth: Payer: Self-pay | Admitting: Oncology

## 2019-10-19 DIAGNOSIS — E1129 Type 2 diabetes mellitus with other diabetic kidney complication: Secondary | ICD-10-CM | POA: Diagnosis not present

## 2019-10-19 DIAGNOSIS — Z992 Dependence on renal dialysis: Secondary | ICD-10-CM | POA: Diagnosis not present

## 2019-10-19 DIAGNOSIS — N186 End stage renal disease: Secondary | ICD-10-CM | POA: Diagnosis not present

## 2019-10-19 DIAGNOSIS — N2581 Secondary hyperparathyroidism of renal origin: Secondary | ICD-10-CM | POA: Diagnosis not present

## 2019-10-19 DIAGNOSIS — D509 Iron deficiency anemia, unspecified: Secondary | ICD-10-CM | POA: Diagnosis not present

## 2019-10-19 DIAGNOSIS — D631 Anemia in chronic kidney disease: Secondary | ICD-10-CM | POA: Diagnosis not present

## 2019-10-19 LAB — KAPPA/LAMBDA LIGHT CHAINS
Kappa free light chain: 118.6 mg/L — ABNORMAL HIGH (ref 3.3–19.4)
Kappa, lambda light chain ratio: 2.32 — ABNORMAL HIGH (ref 0.26–1.65)
Lambda free light chains: 51.1 mg/L — ABNORMAL HIGH (ref 5.7–26.3)

## 2019-10-19 NOTE — Telephone Encounter (Signed)
Scheduled appt per 2/9 los.

## 2019-10-20 ENCOUNTER — Other Ambulatory Visit: Payer: Self-pay

## 2019-10-20 ENCOUNTER — Telehealth (INDEPENDENT_AMBULATORY_CARE_PROVIDER_SITE_OTHER): Payer: Medicare Other | Admitting: Internal Medicine

## 2019-10-20 ENCOUNTER — Encounter: Payer: Self-pay | Admitting: Internal Medicine

## 2019-10-20 VITALS — BP 127/80 | HR 88 | Temp 97.3°F | Ht 70.0 in

## 2019-10-20 DIAGNOSIS — E109 Type 1 diabetes mellitus without complications: Secondary | ICD-10-CM | POA: Diagnosis not present

## 2019-10-20 DIAGNOSIS — G40909 Epilepsy, unspecified, not intractable, without status epilepticus: Secondary | ICD-10-CM

## 2019-10-20 DIAGNOSIS — S065X9A Traumatic subdural hemorrhage with loss of consciousness of unspecified duration, initial encounter: Secondary | ICD-10-CM

## 2019-10-20 DIAGNOSIS — G9341 Metabolic encephalopathy: Secondary | ICD-10-CM | POA: Diagnosis not present

## 2019-10-20 DIAGNOSIS — C9000 Multiple myeloma not having achieved remission: Secondary | ICD-10-CM | POA: Diagnosis not present

## 2019-10-20 DIAGNOSIS — E1122 Type 2 diabetes mellitus with diabetic chronic kidney disease: Secondary | ICD-10-CM

## 2019-10-20 DIAGNOSIS — I10 Essential (primary) hypertension: Secondary | ICD-10-CM | POA: Diagnosis not present

## 2019-10-20 DIAGNOSIS — I120 Hypertensive chronic kidney disease with stage 5 chronic kidney disease or end stage renal disease: Secondary | ICD-10-CM | POA: Diagnosis not present

## 2019-10-20 DIAGNOSIS — S065XAA Traumatic subdural hemorrhage with loss of consciousness status unknown, initial encounter: Secondary | ICD-10-CM

## 2019-10-20 DIAGNOSIS — Z09 Encounter for follow-up examination after completed treatment for conditions other than malignant neoplasm: Secondary | ICD-10-CM

## 2019-10-20 DIAGNOSIS — N186 End stage renal disease: Secondary | ICD-10-CM | POA: Diagnosis not present

## 2019-10-20 DIAGNOSIS — I69351 Hemiplegia and hemiparesis following cerebral infarction affecting right dominant side: Secondary | ICD-10-CM | POA: Diagnosis not present

## 2019-10-20 DIAGNOSIS — I69320 Aphasia following cerebral infarction: Secondary | ICD-10-CM | POA: Diagnosis not present

## 2019-10-20 LAB — MULTIPLE MYELOMA PANEL, SERUM
Albumin SerPl Elph-Mcnc: 3.8 g/dL (ref 2.9–4.4)
Albumin/Glob SerPl: 1.9 — ABNORMAL HIGH (ref 0.7–1.7)
Alpha 1: 0.2 g/dL (ref 0.0–0.4)
Alpha2 Glob SerPl Elph-Mcnc: 0.6 g/dL (ref 0.4–1.0)
B-Globulin SerPl Elph-Mcnc: 0.7 g/dL (ref 0.7–1.3)
Gamma Glob SerPl Elph-Mcnc: 0.6 g/dL (ref 0.4–1.8)
Globulin, Total: 2.1 g/dL — ABNORMAL LOW (ref 2.2–3.9)
IgA: 25 mg/dL — ABNORMAL LOW (ref 90–386)
IgG (Immunoglobin G), Serum: 748 mg/dL (ref 603–1613)
IgM (Immunoglobulin M), Srm: 18 mg/dL — ABNORMAL LOW (ref 20–172)
M Protein SerPl Elph-Mcnc: 0.2 g/dL — ABNORMAL HIGH
Total Protein ELP: 5.9 g/dL — ABNORMAL LOW (ref 6.0–8.5)

## 2019-10-20 LAB — BASIC METABOLIC PANEL
BUN: 50 — AB (ref 4–21)
CO2: 26 — AB (ref 13–22)
Chloride: 97 — AB (ref 99–108)
Creatinine: 9.7 — AB (ref 0.6–1.3)
Glucose: 127
Potassium: 4.3 (ref 3.4–5.3)
Sodium: 141 (ref 137–147)

## 2019-10-20 LAB — CBC AND DIFFERENTIAL: WBC: 9.7

## 2019-10-20 LAB — CBC: RBC: 2.68 — AB (ref 3.87–5.11)

## 2019-10-20 MED FILL — DEXAMETHASONE 4 MG TABLET: 4 | 56 days supply | Qty: 40 | Fill #1

## 2019-10-21 ENCOUNTER — Ambulatory Visit: Payer: Medicare Other

## 2019-10-21 DIAGNOSIS — N2581 Secondary hyperparathyroidism of renal origin: Secondary | ICD-10-CM | POA: Diagnosis not present

## 2019-10-21 DIAGNOSIS — E1129 Type 2 diabetes mellitus with other diabetic kidney complication: Secondary | ICD-10-CM | POA: Diagnosis not present

## 2019-10-21 DIAGNOSIS — N186 End stage renal disease: Secondary | ICD-10-CM

## 2019-10-21 DIAGNOSIS — Z992 Dependence on renal dialysis: Secondary | ICD-10-CM | POA: Diagnosis not present

## 2019-10-21 DIAGNOSIS — C9000 Multiple myeloma not having achieved remission: Secondary | ICD-10-CM

## 2019-10-21 DIAGNOSIS — E1122 Type 2 diabetes mellitus with diabetic chronic kidney disease: Secondary | ICD-10-CM

## 2019-10-21 DIAGNOSIS — D631 Anemia in chronic kidney disease: Secondary | ICD-10-CM | POA: Diagnosis not present

## 2019-10-21 DIAGNOSIS — D509 Iron deficiency anemia, unspecified: Secondary | ICD-10-CM | POA: Diagnosis not present

## 2019-10-21 MED FILL — MIDODRINE HCL 5 MG TABLET: 5 | 90 days supply | Qty: 39 | Fill #0

## 2019-10-21 NOTE — Patient Instructions (Signed)
Social Worker Visit Information  Goals we discussed today:  Goals Addressed            This Visit's Progress   . Assist with care management enrollment and perform SW screen to identify community resource needs       Current SDOH Barriers:  . Limited access to caregiver due to work schedule . Ongoing chronic conditions including multiple myeloma and DM II with ESRD o Chemotherapy every Tuesday o Dialysis M, W, F . Inability to perform ADL's and IADL's independently  Social Work Clinical Goal(s):  [name] Over the next 30 days, patient will work with SW to address concerns related to care coordination needs  CCM SW Interventions: Completed with patients spouse and caregiver [facility] . [name] interviewed and SDOH assessment performed o No acute SDOH needs identified at this time . Discussed the patient is active with [employer] transportation but does not access at this time due to patients level of assistance needed to and from the vehicle o Provided education on the ability for [employer] driver to offer the patient with door to door rather than curb to curb assistance o Advised Mrs. Reardan would contact [employer] with [employer] program to discuss patients status and need for increased assistance o Reviewed plan for Mrs. Raftery to continue assisting at this time as the patient prefers her help while his strength is returning o Ambulance person with [name] be updated to allow [employer] driver to assist door to door as needed once the patient begins utilizing [employer] services in the future  Patient Self Care Activities:  . Attends all scheduled provider appointments . Calls provider office for new concerns or questions . Supportive family member to assist with patient care needs  Initial goal documentation         Materials Provided: Verbal education about [employer] services provided by phone  Follow Up Plan: SW will follow up with patient by phone over the next  10 days.   [name], BSW, CDP Social Worker, Certified Dementia Practitioner [place] [facility] [place] [facility] [phone]

## 2019-10-21 NOTE — Chronic Care Management (AMB) (Signed)
Chronic Care Management    Social Work Follow Up Note  10/21/2019 Name: Isaiah [name] [facility] MRN: [id number] DOB: 1966/03/12  Isaiah [name] is a 54 y.o. year old male who is a primary care patient of Isaiah Chard, MD. The CCM team was consulted for assistance with care coordination.   Review of patient status, including review of consultants reports, other relevant assessments, and collaboration with appropriate care team members and the patient's provider was performed as part of comprehensive patient evaluation and provision of chronic care management services.    SDOH (Social Determinants of Health) screening performed today: no acute challenges are identified at this time.  Outpatient Encounter Medications as of 10/21/2019  Medication Sig Note  . acetaminophen (TYLENOL) 325 MG tablet Take 1-2 tablets (325-650 mg total) by mouth every 4 (four) hours as needed for mild pain.   [name] acyclovir (ZOVIRAX) 400 MG tablet Take 400 mg by mouth at bedtime.   [name] albuterol (PROAIR HFA) 108 (90 Base) MCG/ACT inhaler Inhale two puffs every 4-6 hours if needed for cough or wheeze   . calcium carbonate (TUMS - DOSED IN MG ELEMENTAL CALCIUM) 500 MG chewable tablet Chew 2 tablets by mouth at bedtime. 10/04/2019: On hold per MD  . camphor-menthol (SARNA) lotion Apply 1 application topically every 8 (eight) hours as needed for itching.   . Cetirizine HCl 10 MG CAPS Take 1 capsule by mouth daily.   . cinacalcet (SENSIPAR) 60 MG tablet Take 60 mg by mouth every evening. ON HOLD FOR DIALYSIS PER WIFE   . clonazePAM (KLONOPIN) 0.5 MG tablet Take 2 tablets every Monday, Wednesday, and Friday with hemodialysis   . Daratumumab-Hyaluronidase-fihj (DARZALEX FASPRO [place]) Inject 1 Dose into the skin once a week. On Tuesday   . dexamethasone (DECADRON) 4 MG tablet Take 5 tablets (20 mg total) by mouth once a week. Take 5 tablets (20 mg total) by mouth once a week on day of chemo.   . fluticasone furoate-vilanterol (BREO ELLIPTA)  200-25 MCG/INH AEPB Inhale 1 puff into the lungs daily.   . hydrOXYzine (ATARAX/VISTARIL) 25 MG tablet Take 1 tablet (25 mg total) by mouth every 8 (eight) hours as needed for itching.   . Insulin Lispro (HUMALOG KWIKPEN [place]) Inject 2-4 Units into the skin 3 (three) times daily before meals. Sliding scale: 150-250 =2 units, 250 -350=3 units, 350 += 4 units   . ketorolac (ACULAR) 0.5 % ophthalmic solution Place 1 drop into both eyes 4 (four) times daily.   [name] lacosamide (VIMPAT) 200 MG TABS tablet Take 1 tablet twice a day except on dialysis days M-W-F give 1 tab in AM, 1 tab after dialysis, 1 tab in PM.   . lamoTRIgine (LAMICTAL) 150 MG tablet Take 1 tablet (150 mg total) by mouth 2 (two) times daily.   . multivitamin (RENA-VIT) TABS tablet Take 1 tablet by mouth at bedtime.   . Nutritional Supplements (FEEDING SUPPLEMENT, NEPRO CARB STEADY,) LIQD Take 237 mLs by mouth 3 (three) times daily as needed (Supplement). (Patient not taking: Reported on 10/20/2019)   . pantoprazole (PROTONIX) 40 MG tablet Take 40 mg by mouth daily.   . prednisoLONE acetate (PRED FORTE) 1 % ophthalmic suspension Place 1 drop into the left eye daily.    . prochlorperazine (COMPAZINE) 10 MG tablet TAKE 1 TABLET (10 MG TOTAL) BY MOUTH EVERY 6 (SIX) HOURS AS NEEDED FOR NAUSEA OR VOMITING.    No facility-administered encounter medications on file as of 10/21/2019.     Goals  Addressed            This Visit's Progress   . Assist with care management enrollment and perform SW screen to identify community resource needs       Current SDOH Barriers:  . Limited access to caregiver due to work schedule . Ongoing chronic conditions including multiple myeloma and DM II with ESRD o Chemotherapy every Tuesday o Dialysis M, W, F . Inability to perform ADL's and IADL's independently  Social Work Clinical Goal(s):  [name] Over the next 30 days, patient will work with SW to address concerns related to care coordination needs  CCM SW  Interventions: Completed with patients spouse and caregiver Isaiah [name] . Isaiah [name] interviewed and SDOH assessment performed o No acute SDOH needs identified at this time . Discussed the patient is active with Isaiah [name] transportation but does not access at this time due to patients level of assistance needed to and from the vehicle o Provided education on the ability for Isaiah [name] driver to offer the patient with door to door rather than curb to curb assistance o Advised Isaiah [name] would contact Isaiah [name] with Isaiah [name] program to discuss patients status and need for increased assistance o Reviewed plan for Isaiah [name] to continue assisting at this time as the patient prefers her help while his strength is returning o Ambulance person with Isaiah [name] to request patients provisions be updated to allow Isaiah [name] driver to assist door to door as needed once the patient begins utilizing Isaiah [name] services in the future  Patient Self Care Activities:  . Attends all scheduled provider appointments . Calls provider office for new concerns or questions . Supportive family member to assist with patient care needs  Initial goal documentation         Follow Up Plan: SW will follow up with the patient and/or his spouse over the next week to discuss progression of patient goals.   Isaiah [name], BSW, CDP Social Worker, Certified Dementia Practitioner [place] [facility] [place] [facility] [phone]  Total time spent performing care coordination and/or care management activities with the patient by phone or face to face = 14 minutes.

## 2019-10-24 DIAGNOSIS — D509 Iron deficiency anemia, unspecified: Secondary | ICD-10-CM | POA: Diagnosis not present

## 2019-10-24 DIAGNOSIS — N186 End stage renal disease: Secondary | ICD-10-CM | POA: Diagnosis not present

## 2019-10-24 DIAGNOSIS — N2581 Secondary hyperparathyroidism of renal origin: Secondary | ICD-10-CM | POA: Diagnosis not present

## 2019-10-24 DIAGNOSIS — Z992 Dependence on renal dialysis: Secondary | ICD-10-CM | POA: Diagnosis not present

## 2019-10-24 DIAGNOSIS — D631 Anemia in chronic kidney disease: Secondary | ICD-10-CM | POA: Diagnosis not present

## 2019-10-24 DIAGNOSIS — E1129 Type 2 diabetes mellitus with other diabetic kidney complication: Secondary | ICD-10-CM | POA: Diagnosis not present

## 2019-10-24 MED FILL — VIMPAT 200 MG TABLET: 200 | 30 days supply | Qty: 90 | Fill #2

## 2019-10-24 MED FILL — KETOROLAC 0.5% OPHTH SOLUTI: 0.5 | 37 days supply | Qty: 10 | Fill #1

## 2019-10-25 ENCOUNTER — Inpatient Hospital Stay: Payer: Medicare Other

## 2019-10-25 ENCOUNTER — Telehealth: Payer: Self-pay

## 2019-10-25 ENCOUNTER — Other Ambulatory Visit: Payer: Self-pay

## 2019-10-25 VITALS — BP 148/55 | HR 83 | Temp 97.8°F | Resp 18

## 2019-10-25 DIAGNOSIS — C9000 Multiple myeloma not having achieved remission: Secondary | ICD-10-CM

## 2019-10-25 DIAGNOSIS — I69320 Aphasia following cerebral infarction: Secondary | ICD-10-CM | POA: Diagnosis not present

## 2019-10-25 DIAGNOSIS — E1122 Type 2 diabetes mellitus with diabetic chronic kidney disease: Secondary | ICD-10-CM | POA: Diagnosis not present

## 2019-10-25 DIAGNOSIS — N186 End stage renal disease: Secondary | ICD-10-CM | POA: Diagnosis not present

## 2019-10-25 DIAGNOSIS — Z5111 Encounter for antineoplastic chemotherapy: Secondary | ICD-10-CM | POA: Diagnosis not present

## 2019-10-25 DIAGNOSIS — I69351 Hemiplegia and hemiparesis following cerebral infarction affecting right dominant side: Secondary | ICD-10-CM | POA: Diagnosis not present

## 2019-10-25 DIAGNOSIS — I120 Hypertensive chronic kidney disease with stage 5 chronic kidney disease or end stage renal disease: Secondary | ICD-10-CM | POA: Diagnosis not present

## 2019-10-25 LAB — CBC WITH DIFFERENTIAL (CANCER CENTER ONLY)
Abs Immature Granulocytes: 0 10*3/uL (ref 0.00–0.07)
Basophils Absolute: 0 10*3/uL (ref 0.0–0.1)
Basophils Relative: 0 %
Eosinophils Absolute: 0.2 10*3/uL (ref 0.0–0.5)
Eosinophils Relative: 2 %
HCT: UNDETERMINED % (ref 39.0–52.0)
Hemoglobin: 9.5 g/dL — ABNORMAL LOW (ref 13.0–17.0)
Lymphocytes Relative: 2 %
Lymphs Abs: 0.2 10*3/uL — ABNORMAL LOW (ref 0.7–4.0)
MCH: 34.9 pg — ABNORMAL HIGH (ref 26.0–34.0)
MCHC: UNDETERMINED g/dL (ref 30.0–36.0)
MCV: 91.5 fL (ref 80.0–100.0)
Monocytes Absolute: 0.2 10*3/uL (ref 0.1–1.0)
Monocytes Relative: 2 %
Neutro Abs: 9.6 10*3/uL — ABNORMAL HIGH (ref 1.7–7.7)
Neutrophils Relative %: 94 %
Platelet Count: 212 10*3/uL (ref 150–400)
RBC: UNDETERMINED MIL/uL (ref 4.22–5.81)
RDW: UNDETERMINED % (ref 11.5–15.5)
WBC Count: 10.2 10*3/uL (ref 4.0–10.5)
nRBC: 9 % — ABNORMAL HIGH (ref 0.0–0.2)

## 2019-10-25 LAB — CMP (CANCER CENTER ONLY)
ALT: 7 U/L (ref 0–44)
AST: 10 U/L — ABNORMAL LOW (ref 15–41)
Albumin: 4.1 g/dL (ref 3.5–5.0)
Alkaline Phosphatase: 97 U/L (ref 38–126)
Anion gap: 13 (ref 5–15)
BUN: 38 mg/dL — ABNORMAL HIGH (ref 6–20)
CO2: 31 mmol/L (ref 22–32)
Calcium: 8.8 mg/dL — ABNORMAL LOW (ref 8.9–10.3)
Chloride: 94 mmol/L — ABNORMAL LOW (ref 98–111)
Creatinine: 9.25 mg/dL (ref 0.61–1.24)
GFR, Est AFR Am: 7 mL/min — ABNORMAL LOW (ref >60)
GFR, Estimated: 6 mL/min — ABNORMAL LOW (ref >60)
Glucose, Bld: 228 mg/dL — ABNORMAL HIGH (ref 70–99)
Potassium: 4.6 mmol/L (ref 3.5–5.1)
Sodium: 138 mmol/L (ref 135–145)
Total Bilirubin: 1 mg/dL (ref 0.3–1.2)
Total Protein: 6.7 g/dL (ref 6.5–8.1)

## 2019-10-25 MED ORDER — DIPHENHYDRAMINE HCL 25 MG PO CAPS
50.0000 mg | ORAL_CAPSULE | Freq: Once | ORAL | Status: AC
  Administered 2019-10-25: 50 mg via ORAL

## 2019-10-25 MED ORDER — MONTELUKAST SODIUM 10 MG PO TABS
ORAL_TABLET | ORAL | Status: AC
  Filled 2019-10-25: qty 1

## 2019-10-25 MED ORDER — MONTELUKAST SODIUM 10 MG PO TABS
10.0000 mg | ORAL_TABLET | Freq: Once | ORAL | Status: AC
  Administered 2019-10-25: 10 mg via ORAL

## 2019-10-25 MED ORDER — PROCHLORPERAZINE MALEATE 10 MG PO TABS
ORAL_TABLET | ORAL | Status: AC
  Filled 2019-10-25: qty 1

## 2019-10-25 MED ORDER — ACETAMINOPHEN 325 MG PO TABS
650.0000 mg | ORAL_TABLET | Freq: Once | ORAL | Status: AC
  Administered 2019-10-25: 650 mg via ORAL

## 2019-10-25 MED ORDER — BORTEZOMIB CHEMO SQ INJECTION 3.5 MG (2.5MG/ML)
1.5000 mg/m2 | Freq: Once | INTRAMUSCULAR | Status: AC
  Administered 2019-10-25: 3 mg via SUBCUTANEOUS
  Filled 2019-10-25: qty 1.2

## 2019-10-25 MED ORDER — ACETAMINOPHEN 325 MG PO TABS
ORAL_TABLET | ORAL | Status: AC
  Filled 2019-10-25: qty 2

## 2019-10-25 MED ORDER — DARATUMUMAB-HYALURONIDASE-FIHJ 1800-30000 MG-UT/15ML ~~LOC~~ SOLN
1800.0000 mg | Freq: Once | SUBCUTANEOUS | Status: AC
  Administered 2019-10-25: 1800 mg via SUBCUTANEOUS
  Filled 2019-10-25: qty 15

## 2019-10-25 MED ORDER — DIPHENHYDRAMINE HCL 25 MG PO CAPS
ORAL_CAPSULE | ORAL | Status: AC
  Filled 2019-10-25: qty 2

## 2019-10-25 MED ORDER — PROCHLORPERAZINE MALEATE 10 MG PO TABS
10.0000 mg | ORAL_TABLET | Freq: Once | ORAL | Status: AC
  Administered 2019-10-25: 10 mg via ORAL

## 2019-10-25 NOTE — Telephone Encounter (Signed)
Lab called with Scr 9.25. Dr. Alen Blew made aware, no new orders at this time.

## 2019-10-26 DIAGNOSIS — D509 Iron deficiency anemia, unspecified: Secondary | ICD-10-CM | POA: Diagnosis not present

## 2019-10-26 DIAGNOSIS — Z992 Dependence on renal dialysis: Secondary | ICD-10-CM | POA: Diagnosis not present

## 2019-10-26 DIAGNOSIS — E1129 Type 2 diabetes mellitus with other diabetic kidney complication: Secondary | ICD-10-CM | POA: Diagnosis not present

## 2019-10-26 DIAGNOSIS — N2581 Secondary hyperparathyroidism of renal origin: Secondary | ICD-10-CM | POA: Diagnosis not present

## 2019-10-26 DIAGNOSIS — N186 End stage renal disease: Secondary | ICD-10-CM | POA: Diagnosis not present

## 2019-10-26 DIAGNOSIS — D631 Anemia in chronic kidney disease: Secondary | ICD-10-CM | POA: Diagnosis not present

## 2019-10-28 ENCOUNTER — Ambulatory Visit: Payer: Medicare Other

## 2019-10-28 DIAGNOSIS — D509 Iron deficiency anemia, unspecified: Secondary | ICD-10-CM | POA: Diagnosis not present

## 2019-10-28 DIAGNOSIS — N186 End stage renal disease: Secondary | ICD-10-CM

## 2019-10-28 DIAGNOSIS — E1122 Type 2 diabetes mellitus with diabetic chronic kidney disease: Secondary | ICD-10-CM

## 2019-10-28 DIAGNOSIS — N2581 Secondary hyperparathyroidism of renal origin: Secondary | ICD-10-CM | POA: Diagnosis not present

## 2019-10-28 DIAGNOSIS — E1129 Type 2 diabetes mellitus with other diabetic kidney complication: Secondary | ICD-10-CM | POA: Diagnosis not present

## 2019-10-28 DIAGNOSIS — D631 Anemia in chronic kidney disease: Secondary | ICD-10-CM | POA: Diagnosis not present

## 2019-10-28 DIAGNOSIS — Z992 Dependence on renal dialysis: Secondary | ICD-10-CM | POA: Diagnosis not present

## 2019-10-28 NOTE — Patient Instructions (Signed)
Social Worker Visit Information  Goals we discussed today:  Goals Addressed            This Visit's Progress     Patient Stated   . COMPLETED: "I would like caregiver assistance on Tuesday's and Thursday's when he is not at Dialysis" (pt-stated)       [place] Stated:  Current Barriers:  . Financial constraints related to cost of private duty caregivers . ADL IADL limitations . Limited knowledge of community resources to assist with care assistance  Social Work Clinical Goal(s):  [name] Over the next 60 days the patients and his wife will work with SW to assist with caregiver resource needs  CCM SW Interventions: Completed 10/28/19 with [facility] . Collaboration with Alicia Amel, CAP/DA program administrator who confirms receipt of patient referral o Mrs. Karlene Lineman reports the patient should receive an application in the mail . Outbound call to [facility] to discuss referral o Confirmed Mrs. [facility] has received the CAP program application in the mail o Discussed application process with Mrs. [facility] o Encouraged Mrs. Kopecky to contact SW with future caregiver questions  Patient Self Care Activities:  . Self administers medications as prescribed . Calls pharmacy for medication refills . Calls provider office for new concerns or questions . Supportive spouse to assist with ongoing care needs   Please see past updates related to this goal by clicking on the "Past Updates" button in the selected goal          Materials Provided: Verbal education about CAP/DA program provided by phone  Follow Up Plan: SW will follow up with patient by phone over the next month.   [name], BSW, CDP Social Worker, Certified Dementia Practitioner [place] [facility] [place] [facility] [phone]

## 2019-10-28 NOTE — Chronic Care Management (AMB) (Signed)
Chronic Care Management    Social Work Follow Up Note  10/28/2019 Name: Demarian Epps [facility] MRN: [id number] DOB: 1966-01-16  CLELL TRAHAN is a 54 y.o. year old male who is a primary care patient of [name], MD. The CCM team was consulted for assistance with care coordination.   Review of patient status, including review of consultants reports, other relevant assessments, and collaboration with appropriate care team members and the patient's provider was performed as part of comprehensive patient evaluation and provision of chronic care management services.    SDOH (Social Determinants of Health) assessments performed: No    SW placed an outbound call to the patients wife, [facility], to assist with care coordination needs.  Outpatient Encounter Medications as of 10/28/2019  Medication Sig Note  . acetaminophen (TYLENOL) 325 MG tablet Take 1-2 tablets (325-650 mg total) by mouth every 4 (four) hours as needed for mild pain.   [name] acyclovir (ZOVIRAX) 400 MG tablet Take 400 mg by mouth at bedtime.   [name] albuterol (PROAIR HFA) 108 (90 Base) MCG/ACT inhaler Inhale two puffs every 4-6 hours if needed for cough or wheeze   . calcium carbonate (TUMS - DOSED IN MG ELEMENTAL CALCIUM) 500 MG chewable tablet Chew 2 tablets by mouth at bedtime. 10/04/2019: On hold per MD  . camphor-menthol (SARNA) lotion Apply 1 application topically every 8 (eight) hours as needed for itching.   . Cetirizine HCl 10 MG CAPS Take 1 capsule by mouth daily.   . cinacalcet (SENSIPAR) 60 MG tablet Take 60 mg by mouth every evening. ON HOLD FOR DIALYSIS PER WIFE   . clonazePAM (KLONOPIN) 0.5 MG tablet Take 2 tablets every Monday, Wednesday, and Friday with hemodialysis   . Daratumumab-Hyaluronidase-fihj (DARZALEX FASPRO [place]) Inject 1 Dose into the skin once a week. On Tuesday   . dexamethasone (DECADRON) 4 MG tablet Take 5 tablets (20 mg total) by mouth once a week. Take 5 tablets (20 mg total) by mouth once a week on day of  chemo.   . fluticasone furoate-vilanterol (BREO ELLIPTA) 200-25 MCG/INH AEPB Inhale 1 puff into the lungs daily.   . hydrOXYzine (ATARAX/VISTARIL) 25 MG tablet Take 1 tablet (25 mg total) by mouth every 8 (eight) hours as needed for itching.   . Insulin Lispro (HUMALOG KWIKPEN ) Inject 2-4 Units into the skin 3 (three) times daily before meals. Sliding scale: 150-250 =2 units, 250 -350=3 units, 350 += 4 units   . ketorolac (ACULAR) 0.5 % ophthalmic solution Place 1 drop into both eyes 4 (four) times daily.   [name] lacosamide (VIMPAT) 200 MG TABS tablet Take 1 tablet twice a day except on dialysis days M-W-F give 1 tab in AM, 1 tab after dialysis, 1 tab in PM.   . lamoTRIgine (LAMICTAL) 150 MG tablet Take 1 tablet (150 mg total) by mouth 2 (two) times daily.   . multivitamin (RENA-VIT) TABS tablet Take 1 tablet by mouth at bedtime.   . Nutritional Supplements (FEEDING SUPPLEMENT, NEPRO CARB STEADY,) LIQD Take 237 mLs by mouth 3 (three) times daily as needed (Supplement). (Patient not taking: Reported on 10/20/2019)   . pantoprazole (PROTONIX) 40 MG tablet Take 40 mg by mouth daily.   . prednisoLONE acetate (PRED FORTE) 1 % ophthalmic suspension Place 1 drop into the left eye daily.    . prochlorperazine (COMPAZINE) 10 MG tablet TAKE 1 TABLET (10 MG TOTAL) BY MOUTH EVERY 6 (SIX) HOURS AS NEEDED FOR NAUSEA OR VOMITING.    No facility-administered  encounter medications on file as of 10/28/2019.     Goals Addressed            This Visit's Progress     Patient Stated   . COMPLETED: "I would like caregiver assistance on Tuesday's and Thursday's when he is not at Dialysis" (pt-stated)       [place] Stated:  Current Barriers:  . Financial constraints related to cost of private duty caregivers . ADL IADL limitations . Limited knowledge of community resources to assist with care assistance  Social Work Clinical Goal(s):  [name] Over the next 60 days the patients and his wife will work with  SW to assist with caregiver resource needs  CCM SW Interventions: Completed 10/28/19 with [name] . Collaboration with Alicia Amel, CAP/DA program administrator who confirms receipt of patient referral o Mrs. Karlene Lineman reports the patient should receive an application in the mail . Outbound call to [facility] to discuss referral o Confirmed Mrs. [facility] has received the CAP program application in the mail o Discussed application process with Mrs. [facility] o Encouraged Mrs. [place] to contact SW with future caregiver questions  Patient Self Care Activities:  . Self administers medications as prescribed . Calls pharmacy for medication refills . Calls provider office for new concerns or questions . Supportive spouse to assist with ongoing care needs   Please see past updates related to this goal by clicking on the "Past Updates" button in the selected goal          Follow Up Plan: SW will follow up with patient by phone over the next month.   [name], BSW, CDP Social Worker, Certified Dementia Practitioner [place] [facility] [place] [facility] [phone]  Total time spent performing care coordination and/or care management activities with the patient by phone or face to face = 7 minutes.

## 2019-10-29 DIAGNOSIS — C9000 Multiple myeloma not having achieved remission: Secondary | ICD-10-CM | POA: Diagnosis not present

## 2019-10-29 DIAGNOSIS — I69351 Hemiplegia and hemiparesis following cerebral infarction affecting right dominant side: Secondary | ICD-10-CM | POA: Diagnosis not present

## 2019-10-29 DIAGNOSIS — N186 End stage renal disease: Secondary | ICD-10-CM | POA: Diagnosis not present

## 2019-10-29 DIAGNOSIS — I69320 Aphasia following cerebral infarction: Secondary | ICD-10-CM | POA: Diagnosis not present

## 2019-10-29 DIAGNOSIS — E1122 Type 2 diabetes mellitus with diabetic chronic kidney disease: Secondary | ICD-10-CM | POA: Diagnosis not present

## 2019-10-29 DIAGNOSIS — I120 Hypertensive chronic kidney disease with stage 5 chronic kidney disease or end stage renal disease: Secondary | ICD-10-CM | POA: Diagnosis not present

## 2019-10-30 ENCOUNTER — Encounter: Payer: Self-pay | Admitting: Internal Medicine

## 2019-10-31 ENCOUNTER — Other Ambulatory Visit: Payer: Self-pay

## 2019-10-31 ENCOUNTER — Telehealth: Payer: Self-pay | Admitting: Neurology

## 2019-10-31 DIAGNOSIS — N186 End stage renal disease: Secondary | ICD-10-CM | POA: Diagnosis not present

## 2019-10-31 DIAGNOSIS — D509 Iron deficiency anemia, unspecified: Secondary | ICD-10-CM | POA: Diagnosis not present

## 2019-10-31 DIAGNOSIS — D631 Anemia in chronic kidney disease: Secondary | ICD-10-CM | POA: Diagnosis not present

## 2019-10-31 DIAGNOSIS — Z992 Dependence on renal dialysis: Secondary | ICD-10-CM | POA: Diagnosis not present

## 2019-10-31 DIAGNOSIS — E1129 Type 2 diabetes mellitus with other diabetic kidney complication: Secondary | ICD-10-CM | POA: Diagnosis not present

## 2019-10-31 DIAGNOSIS — N2581 Secondary hyperparathyroidism of renal origin: Secondary | ICD-10-CM | POA: Diagnosis not present

## 2019-10-31 MED ORDER — MIDODRINE HCL 5 MG PO TABS: ORAL_TABLET | ORAL | 1 refills | Status: DC

## 2019-10-31 NOTE — Telephone Encounter (Signed)
Patient's wife called to let Dr. Delice Lesch know her husband has been having problems with his left leg not having any feeling intermittently. She said it started Friday, 10/28/19, and he cannot walk when it happens. It took her and her son 1 hour to get him into the truck reportedly. Since then he has had continued problems with it happening off and on.   Patient has a virtually visit on 11/01/19 at 1:00 PM with Dr. Delice Lesch.

## 2019-11-01 ENCOUNTER — Other Ambulatory Visit: Payer: Self-pay

## 2019-11-01 ENCOUNTER — Inpatient Hospital Stay: Payer: Medicare Other

## 2019-11-01 ENCOUNTER — Telehealth (INDEPENDENT_AMBULATORY_CARE_PROVIDER_SITE_OTHER): Payer: Medicare Other | Admitting: Neurology

## 2019-11-01 ENCOUNTER — Telehealth: Payer: Self-pay

## 2019-11-01 ENCOUNTER — Encounter: Payer: Self-pay | Admitting: Neurology

## 2019-11-01 ENCOUNTER — Other Ambulatory Visit: Payer: Self-pay | Admitting: Neurology

## 2019-11-01 ENCOUNTER — Ambulatory Visit: Payer: Self-pay

## 2019-11-01 VITALS — BP 124/70 | HR 85 | Temp 99.1°F | Resp 16

## 2019-11-01 VITALS — BP 101/55 | HR 76 | Ht 70.0 in | Wt 184.0 lb

## 2019-11-01 DIAGNOSIS — N186 End stage renal disease: Secondary | ICD-10-CM | POA: Diagnosis not present

## 2019-11-01 DIAGNOSIS — R569 Unspecified convulsions: Secondary | ICD-10-CM | POA: Diagnosis not present

## 2019-11-01 DIAGNOSIS — C9000 Multiple myeloma not having achieved remission: Secondary | ICD-10-CM

## 2019-11-01 DIAGNOSIS — S065X9A Traumatic subdural hemorrhage with loss of consciousness of unspecified duration, initial encounter: Secondary | ICD-10-CM | POA: Diagnosis not present

## 2019-11-01 DIAGNOSIS — I120 Hypertensive chronic kidney disease with stage 5 chronic kidney disease or end stage renal disease: Secondary | ICD-10-CM | POA: Diagnosis not present

## 2019-11-01 DIAGNOSIS — S065XAA Traumatic subdural hemorrhage with loss of consciousness status unknown, initial encounter: Secondary | ICD-10-CM

## 2019-11-01 DIAGNOSIS — I69320 Aphasia following cerebral infarction: Secondary | ICD-10-CM | POA: Diagnosis not present

## 2019-11-01 DIAGNOSIS — Z5111 Encounter for antineoplastic chemotherapy: Secondary | ICD-10-CM | POA: Diagnosis not present

## 2019-11-01 DIAGNOSIS — E1122 Type 2 diabetes mellitus with diabetic chronic kidney disease: Secondary | ICD-10-CM | POA: Diagnosis not present

## 2019-11-01 DIAGNOSIS — I69351 Hemiplegia and hemiparesis following cerebral infarction affecting right dominant side: Secondary | ICD-10-CM | POA: Diagnosis not present

## 2019-11-01 DIAGNOSIS — G40209 Localization-related (focal) (partial) symptomatic epilepsy and epileptic syndromes with complex partial seizures, not intractable, without status epilepticus: Secondary | ICD-10-CM

## 2019-11-01 LAB — CMP (CANCER CENTER ONLY)
ALT: 7 U/L (ref 0–44)
AST: 9 U/L — ABNORMAL LOW (ref 15–41)
Albumin: 4 g/dL (ref 3.5–5.0)
Alkaline Phosphatase: 98 U/L (ref 38–126)
Anion gap: 13 (ref 5–15)
BUN: 29 mg/dL — ABNORMAL HIGH (ref 6–20)
CO2: 31 mmol/L (ref 22–32)
Calcium: 9.4 mg/dL (ref 8.9–10.3)
Chloride: 94 mmol/L — ABNORMAL LOW (ref 98–111)
Creatinine: 7.76 mg/dL (ref 0.61–1.24)
GFR, Est AFR Am: 8 mL/min — ABNORMAL LOW (ref >60)
GFR, Estimated: 7 mL/min — ABNORMAL LOW (ref >60)
Glucose, Bld: 154 mg/dL — ABNORMAL HIGH (ref 70–99)
Potassium: 4.1 mmol/L (ref 3.5–5.1)
Sodium: 138 mmol/L (ref 135–145)
Total Bilirubin: 1 mg/dL (ref 0.3–1.2)
Total Protein: 6.7 g/dL (ref 6.5–8.1)

## 2019-11-01 LAB — CBC WITH DIFFERENTIAL (CANCER CENTER ONLY)
Abs Immature Granulocytes: 0 10*3/uL (ref 0.00–0.07)
Basophils Absolute: 0.1 10*3/uL (ref 0.0–0.1)
Basophils Relative: 1 %
Eosinophils Absolute: 0.2 10*3/uL (ref 0.0–0.5)
Eosinophils Relative: 2 %
HCT: 26.2 % — ABNORMAL LOW (ref 39.0–52.0)
Hemoglobin: 9.8 g/dL — ABNORMAL LOW (ref 13.0–17.0)
Lymphocytes Relative: 2 %
Lymphs Abs: 0.2 10*3/uL — ABNORMAL LOW (ref 0.7–4.0)
MCH: 34.8 pg — ABNORMAL HIGH (ref 26.0–34.0)
MCHC: 37.4 g/dL — ABNORMAL HIGH (ref 30.0–36.0)
MCV: 91.8 fL (ref 80.0–100.0)
Monocytes Absolute: 0.6 10*3/uL (ref 0.1–1.0)
Monocytes Relative: 7 %
Neutro Abs: 7.2 10*3/uL (ref 1.7–7.7)
Neutrophils Relative %: 88 %
Platelet Count: 263 10*3/uL (ref 150–400)
RBC: 2.82 MIL/uL — ABNORMAL LOW (ref 4.22–5.81)
RDW: 16.1 % — ABNORMAL HIGH (ref 11.5–15.5)
WBC Count: 8.2 10*3/uL (ref 4.0–10.5)
nRBC: 13.2 % — ABNORMAL HIGH (ref 0.0–0.2)

## 2019-11-01 MED ORDER — ACETAMINOPHEN 325 MG PO TABS
ORAL_TABLET | ORAL | Status: AC
  Filled 2019-11-01: qty 2

## 2019-11-01 MED ORDER — MONTELUKAST SODIUM 10 MG PO TABS
10.0000 mg | ORAL_TABLET | Freq: Once | ORAL | Status: AC
  Administered 2019-11-01: 10 mg via ORAL

## 2019-11-01 MED ORDER — DIPHENHYDRAMINE HCL 25 MG PO CAPS
ORAL_CAPSULE | ORAL | Status: AC
  Filled 2019-11-01: qty 2

## 2019-11-01 MED ORDER — PROCHLORPERAZINE MALEATE 10 MG PO TABS
10.0000 mg | ORAL_TABLET | Freq: Once | ORAL | Status: AC
  Administered 2019-11-01: 10 mg via ORAL

## 2019-11-01 MED ORDER — MONTELUKAST SODIUM 10 MG PO TABS
ORAL_TABLET | ORAL | Status: AC
  Filled 2019-11-01: qty 1

## 2019-11-01 MED ORDER — ACETAMINOPHEN 325 MG PO TABS
650.0000 mg | ORAL_TABLET | Freq: Once | ORAL | Status: AC
  Administered 2019-11-01: 650 mg via ORAL

## 2019-11-01 MED ORDER — LAMOTRIGINE 200 MG PO TABS: 200.0000 mg | ORAL_TABLET | Freq: Two times a day (BID) | ORAL | 11 refills | Status: DC

## 2019-11-01 MED ORDER — DIPHENHYDRAMINE HCL 25 MG PO CAPS
50.0000 mg | ORAL_CAPSULE | Freq: Once | ORAL | Status: AC
  Administered 2019-11-01: 50 mg via ORAL

## 2019-11-01 MED ORDER — PROCHLORPERAZINE MALEATE 10 MG PO TABS
ORAL_TABLET | ORAL | Status: AC
  Filled 2019-11-01: qty 1

## 2019-11-01 MED ORDER — DARATUMUMAB-HYALURONIDASE-FIHJ 1800-30000 MG-UT/15ML ~~LOC~~ SOLN
1800.0000 mg | Freq: Once | SUBCUTANEOUS | Status: AC
  Administered 2019-11-01: 1800 mg via SUBCUTANEOUS
  Filled 2019-11-01: qty 15

## 2019-11-01 MED ORDER — BORTEZOMIB CHEMO SQ INJECTION 3.5 MG (2.5MG/ML)
1.5000 mg/m2 | Freq: Once | INTRAMUSCULAR | Status: AC
  Administered 2019-11-01: 3 mg via SUBCUTANEOUS
  Filled 2019-11-01: qty 1.2

## 2019-11-01 MED FILL — SUBVENITE 200 MG TABS: 200 | 30 days supply | Qty: 60 | Fill #0

## 2019-11-01 MED FILL — BREO ELLIPTA 200-25 MCG INH: 200-25 | 30 days supply | Qty: 60 | Fill #1

## 2019-11-01 MED FILL — clonazePAM 0.5 MG TABS: 0.5 | 35 days supply | Qty: 30 | Fill #2

## 2019-11-01 NOTE — Progress Notes (Signed)
Virtual Visit via Video Note The purpose of this virtual visit is to provide medical care while limiting exposure to the novel coronavirus.    Consent was obtained for video visit:  Yes.   Answered questions that patient had about telehealth interaction:  Yes.   I discussed the limitations, risks, security and privacy concerns of performing an evaluation and management service by telemedicine. I also discussed with the patient that there may be a patient responsible charge related to this service. The patient expressed understanding and agreed to proceed.  Pt location: Home Physician Location: office Name of referring provider:  [name], MD I connected with KIING DEAKIN at patients initiation/request on 11/01/2019 at  2:00 PM EST by video enabled telemedicine application and verified that I am speaking with the correct person using two identifiers. Pt MRN:  [id number] Pt DOB:  07/31/1966 Video Participants:  [place];  [facility] - Kmi (spouse)   History of Present Illness:  The patient had a virtual video visit on 11/01/2019. He was last seen in the neurology clinic 3 months ago. He has had a complicated course, with a history of seizures secondary to left MCA stroke that occurred in 2017. He had an acute on chronic right subdural hematoma in September 2020 while preparing for chemotherapy and stem cell transplant, this have been put on hold. He had been seizure-free since addition of clonazepam during dialysis in March 2019, until he had the SDH and had an increase in seizures. He is on Lamotrigine 1361m BID, Vimpat 2068mBID (with additional dose after HD), and clonazepam 61m261mrior to HD. He has not had his typical seizures, however his wife started reporting episodes where he would not seem to respond and would be like deadweight, unable to move around. He had a 48-hour EEG in Jan 2021 which showed occasional left temporal slowing, no epileptiform discharges. Typical episode of  confusion and weakness captured did not show epileptiform correlate. We discussed how symptoms are likely to due hypotension/hypoperfusion. A few days after the EEG, he was again unable to move to go to dialysis and vomited. Speech was slow and slurred. [facility] encephalopathy from missed HD. Head CT showed the chronic subdural hematoma over the right cerebral convexity, mildly increased in density, unchanged size, chronic bilateral cerebral and left cerebellar infarcts. He continues to have these episodes at home. PT is working with him today. SylSunday Spillersports Monday and Friday were not good days, she was talking to him and it looked like he was staring off in space so she yelled at him and he looked at her. He says he hears but is not responding. He is "like a limp noodle" and says his left leg is weak. She had checked his BP during these times, he has a labile BP from 88/50 to 144/70. There have been a couple of times during HD that they had to stop HD due to hypotension but he did not have these spells. She states she would try to get him to a wheelchair and he would not move anything. He says he understands but when she asks him to move his leg, he is not moving it. No pain.    History on Initial Assessment 10/29/2017: This is a pleasant [age] RH man with a history of diabetes, sickle-cell thalassemia, DVT and PE off Coumadin due to GI bleed, sleep apnea, MGUS, CKD on dialysis, and strokes in multiple vascular distributions  in January 2017 with subsequent seizures. Stroke workup showed left M2 superior branch occlusion, TEE with PFO, hypercoagulation studies negative. He is now on aspirin for secondary stroke prevention. He made significant improvement with right-sided weakness and speech was more fluent. He had a seizure in April 2017 and was started on Vimpat 143m BID. EEG reported diffuse slowing. Limited MRI brain showed multiple late subacute and chronic  infarcts, and repeat imaging in May 2017 showed resolution with no new infarct. [place] and was found to be anemic with Hgb of 6.8, requiring multiple blood transfusions. Vimpat increased to 1541mBID. EEG showed mild intermittent generalized theta slowing. He had another seizure after dialysis in November 2017 and Dilantin 30026maily was added. His wife did not want him to be on this long term, and was switched to Lamotrigine. He had another seizure in January 2018 a few days after Dilantin was tapered off. He had another seizure in October 2018 again after dialysis. The last seizure was on 10/19/17 again after dialysis. This was slightly different because he was belligerent and agitated and his wife had to calm him down. He is taking Vimpat 150m36mD and Lamotrigine 150mg89m with no side effects. He sometimes has a warning prior to the seizures. With the most recent seizure, he was standing at the kitchen table, recalls his wife picking him up, then coming to on the sofa. Seizures last 5-10 minutes, his wife describes staring off, unresponsive, then trembling/low amplitude shaking. He is tired after, with worsened right-sided weakness. He would be "pretty out of it" the next day. Majority of seizures occur right after dialysis, he would still be sitting in the dialysis unit, but the most recent seizure occurred at home an hour after dialysis. He reports BP is not an issue because his nephrologist has regulated his medications and BP is good most of the time.   He has occasional tremors in both legs, he has a right hand tremor noted in the office today. He noticed memory changes after his stroke, but does not think memory is worse after each seizure. He wonders about his short-term memory, sometimes he has to think which is his right or left side. He denies any olfactory/gustatory hallucinations, deja vu, rising epigastric sensation,myoclonic  jerks.   Epilepsy Risk Factors:  Prior strokes in multiple vascular distributions. Otherwise he had a normal birth and early development.  There is no history of febrile convulsions, CNS infections such as meningitis/encephalitis, significant traumatic brain injury, neurosurgical procedures, or family history of seizures.  Diagnostic Data: I personally reviewed MRI brain without contrast done 01/2016 which showed left MCA infarct, right posterior temporal and occipital hemorrhagic infarct, left frontal infarct with remote blood products, remote hemorrhagic infarct in the more superior right frontal lobe, remote nonhemorrhagic lacunar infarcts in the left cerebellum.  Prior AEDs: Dilantin  MEDICATIONS: Current Outpatient Medications on File Prior to Visit  Medication Sig Dispense Refill  . acetaminophen (TYLENOL) 325 MG tablet Take 1-2 tablets (325-650 mg total) by mouth every 4 (four) hours as needed for mild pain.    . acyMarland Kitchenlovir (ZOVIRAX) 400 MG tablet Take 400 mg by mouth at bedtime.    . albMarland Kitchenterol (PROAIR HFA) 108 (90 Base) MCG/ACT inhaler Inhale two puffs every 4-6 hours if needed for cough or wheeze 1 Inhaler 1  . camphor-menthol (SARNA) lotion Apply 1 application topically every 8 (eight) hours as needed for itching. 222 mL 0  .  Cetirizine HCl 10 MG CAPS Take 1 capsule by mouth daily.    . clonazePAM (KLONOPIN) 0.5 MG tablet Take 2 tablets every Monday, Wednesday, and Friday with hemodialysis 30 tablet 5  . dexamethasone (DECADRON) 4 MG tablet Take 5 tablets (20 mg total) by mouth once a week. Take 5 tablets (20 mg total) by mouth once a week on day of chemo. 40 tablet 3  . fluticasone furoate-vilanterol (BREO ELLIPTA) 200-25 MCG/INH AEPB Inhale 1 puff into the lungs daily.    . hydrOXYzine (ATARAX/VISTARIL) 25 MG tablet Take 1 tablet (25 mg total) by mouth every 8 (eight) hours as needed for itching. 30 tablet 0  . Insulin Lispro (HUMALOG KWIKPEN [place]) Inject 2-4 Units into the skin 3  (three) times daily before meals. Sliding scale: 150-250 =2 units, 250 -350=3 units, 350 += 4 units    . ketorolac (ACULAR) 0.5 % ophthalmic solution Place 1 drop into both eyes 4 (four) times daily.    [name] lacosamide (VIMPAT) 200 MG TABS tablet Take 1 tablet twice a day except on dialysis days M-W-F give 1 tab in AM, 1 tab after dialysis, 1 tab in PM. 90 tablet 5  . lamoTRIgine (LAMICTAL) 150 MG tablet Take 1 tablet (150 mg total) by mouth 2 (two) times daily. 180 tablet 3  . midodrine (PROAMATINE) 5 MG tablet Take one tablet by mouth 30 minutes before dialysis. 30 tablet 1  . multivitamin (RENA-VIT) TABS tablet Take 1 tablet by mouth at bedtime.    . Nutritional Supplements (FEEDING SUPPLEMENT, NEPRO CARB STEADY,) LIQD Take 237 mLs by mouth 3 (three) times daily as needed (Supplement). 237 mL 12  . pantoprazole (PROTONIX) 40 MG tablet Take 40 mg by mouth daily.    . prochlorperazine (COMPAZINE) 10 MG tablet TAKE 1 TABLET (10 MG TOTAL) BY MOUTH EVERY 6 (SIX) HOURS AS NEEDED FOR NAUSEA OR VOMITING. 30 tablet 0  . calcium carbonate (TUMS - DOSED IN MG ELEMENTAL CALCIUM) 500 MG chewable tablet Chew 2 tablets by mouth at bedtime.    . cinacalcet (SENSIPAR) 60 MG tablet Take 60 mg by mouth every evening. ON HOLD FOR DIALYSIS PER WIFE    . prednisoLONE acetate (PRED FORTE) 1 % ophthalmic suspension Place 1 drop into the left eye daily.      No current facility-administered medications on file prior to visit.    Observations/Objective:   Vitals:   11/01/19 1039  BP: (!) 101/55  Pulse: 76  Weight: 184 lb (83.5 kg)  Height: 5' 10"  (1.778 m)   GEN:  The patient appears stated age and is in NAD. Slow to respond but answers appropriately.  Neurological examination: Patient is awake, alert, oriented x 3. No aphasia or dysarthria. Intact fluency and comprehension. Remote and recent memory impaired. Cranial nerves: Extraocular movements intact with no nystagmus. No facial asymmetry. Motor: moves all  extremities symmetrically, at least anti-gravity x 4. No incoordination on finger to nose testing. Gait: slow and cautious, needs 1-2 person assist.   Assessment and Plan:   This is a pleasant 54 yo RH man with a history of  diabetes, sickle-cell thalassemia, DVT and PE off Coumadin due to GI bleed, sleep apnea, CKD on dialysis, strokes in multiple vascular distributions in January 2017 with subsequent seizures. He had a complicated course while preparing for stem cell transplant for multiple myeloma, found to have a nontraumatic right subdural hematoma. His prior seizures were mostly occurring during dialysis and had good response to clonazepam dose prior to  HD. His wife started reporting new episodes where he would not respond/follow instructions to move his legs. EEG captured one of these with no epileptiform changes seen. Etiology felt to be due to hypotension/hypoperfusion, however BP has been elevated during some of them. Etiology of symptoms unclear, he has had repeated head CT scans with no change in SDH. They may be focal seizures now arising from the right SDH (reporting left leg weakness). We discussed increasing Lamotrigine to 282m BID and assess for response. Continue Vimpat 2020mBID (additional dose after HD) and clonazepam 8m52mrior to HD. He does not drive. Continue PT/OT. Follow-up in 2-3 months, they know to call for any changes.    Follow Up Instructions:    -I discussed the assessment and treatment plan with the patient. The patient was provided an opportunity to ask questions and all were answered. The patient agreed with the plan and demonstrated an understanding of the instructions.   The patient was advised to call back or seek an in-person evaluation if the symptoms worsen or if the condition fails to improve as anticipated.     KarCameron SprangD

## 2019-11-01 NOTE — Telephone Encounter (Signed)
The pt's [name] called and wanted to give the office a heads up that she will be dropping off paperwork that Tillie Rung had for the [employer] Alternative referral process for medicaid.

## 2019-11-01 NOTE — Patient Instructions (Signed)
Social Worker Visit Information  Goals we discussed today:  Goals Addressed            This Visit's Progress   . Collaborate with RN Care Manager to perform apprpriate assessments to determine care management and care coordination needs       Current Barriers:  . Care management and care coordination needs related to DMII, ESRD, and multiple myeloma  which resulted in inpatient hospitalization . ADL IADL limitations  Clinical Social Work Clinical Goal(s):  [name] Over the next 60 days, patient will collaborate with care management team to assist with care coordination needs in response to recent inpatient hospitalization  CCM SW Interventions: Completed 11/01/19 with spouse [facility] . SW received inbound call from the patients spouse, Gabryel Files to review CAP/DA paperwork . Verbally assisted with application questions . Advised Mrs. Metallo to have provider complete clinical information prior to submission . Collaboration with provider office regarding application   Patient Self Care Activities:  . Attends all scheduled provider appointments . Calls pharmacy for medication refills . Calls provider office for new concerns or questions . Supportive wife and caregiver to assist with patient care needs   Please see past updates related to this goal by clicking on the "Past Updates" button in the selected goal        Follow Up Plan: SW will follow up with patient by phone over the next week.   [name], BSW, CDP Social Worker, Certified Dementia Practitioner [place] [facility] [place] [facility] [phone]

## 2019-11-01 NOTE — Chronic Care Management (AMB) (Signed)
Chronic Care Management    Social Work Follow Up Note  11/01/2019 Name: Isaiah [name] [facility] MRN: [id number] DOB: 03/22/1966  Isaiah [name] is a 54 y.o. year old male who is a primary care patient of [name], MD. The CCM team was consulted for assistance with care coordination.   Review of patient status, including review of consultants reports, other relevant assessments, and collaboration with appropriate care team members and the patient's provider was performed as part of comprehensive patient evaluation and provision of chronic care management services.    SDOH (Social Determinants of Health) assessments performed: No    Outpatient Encounter Medications as of 11/01/2019  Medication Sig Note  . acetaminophen (TYLENOL) 325 MG tablet Take 1-2 tablets (325-650 mg total) by mouth every 4 (four) hours as needed for mild pain.   [name] acyclovir (ZOVIRAX) 400 MG tablet Take 400 mg by mouth at bedtime.   [name] albuterol (PROAIR HFA) 108 (90 Base) MCG/ACT inhaler Inhale two puffs every 4-6 hours if needed for cough or wheeze   . Alcohol Swabs (SM ALCOHOL PREP) 70 % PADS    . brimonidine (ALPHAGAN) 0.2 % ophthalmic solution    . calcitRIOL (ROCALTROL) 0.25 MCG capsule Take by mouth 3 (three) times a week.   . calcium carbonate (TUMS - DOSED IN MG ELEMENTAL CALCIUM) 500 MG chewable tablet Chew 2 tablets by mouth at bedtime. 10/04/2019: On hold per MD  . camphor-menthol (SARNA) lotion Apply 1 application topically every 8 (eight) hours as needed for itching.   . Cetirizine HCl 10 MG CAPS Take 1 capsule by mouth daily.   . cinacalcet (SENSIPAR) 60 MG tablet Take 60 mg by mouth every evening. ON HOLD FOR DIALYSIS PER WIFE   . clonazePAM (KLONOPIN) 0.5 MG tablet Take 2 tablets every Monday, Wednesday, and Friday with hemodialysis   . DARZALEX FASPRO 1800-30000 MG-UT/15ML SOLN Inject 1   . dexamethasone (DECADRON) 4 MG tablet Take 5 tablets (20 mg total) by mouth once a week. Take 5 tablets (20 mg total) by  mouth once a week on day of chemo.   . fluticasone furoate-vilanterol (BREO ELLIPTA) 200-25 MCG/INH AEPB Inhale 1 puff into the lungs daily.   [name] FREESTYLE LITE test strip    . hydrOXYzine (ATARAX/VISTARIL) 25 MG tablet Take 1 tablet (25 mg total) by mouth every 8 (eight) hours as needed for itching.   . Insulin Lispro (HUMALOG KWIKPEN [place]) Inject 2-4 Units into the skin 3 (three) times daily before meals. Sliding scale: 150-250 =2 units, 250 -350=3 units, 350 += 4 units   . ketorolac (ACULAR) 0.5 % ophthalmic solution Place 1 drop into both eyes 4 (four) times daily.   [name] lacosamide (VIMPAT) 200 MG TABS tablet Take 1 tablet twice a day except on dialysis days M-W-F give 1 tab in AM, 1 tab after dialysis, 1 tab in PM.   . lamoTRIgine (LAMICTAL) 200 MG tablet Take 1 tablet (200 mg total) by mouth 2 (two) times daily.   . Lancets (FREESTYLE) lancets    . Methoxy PEG-Epoetin Beta (MIRCERA IJ) Mircera   . midodrine (PROAMATINE) 5 MG tablet Take one tablet by mouth 30 minutes before dialysis.   [name] multivitamin (RENA-VIT) TABS tablet Take 1 tablet by mouth at bedtime.   . Nutritional Supplements (FEEDING SUPPLEMENT, NEPRO CARB STEADY,) LIQD Take 237 mLs by mouth 3 (three) times daily as needed (Supplement).   [name] ofloxacin (OCUFLOX) 0.3 % ophthalmic solution Apply to eye.   . pantoprazole (PROTONIX) 40  MG tablet Take 40 mg by mouth daily.   . prednisoLONE acetate (PRED FORTE) 1 % ophthalmic suspension Place 1 drop into the left eye daily.    . prochlorperazine (COMPAZINE) 10 MG tablet TAKE 1 TABLET (10 MG TOTAL) BY MOUTH EVERY 6 (SIX) HOURS AS NEEDED FOR NAUSEA OR VOMITING.   . [DISCONTINUED] lamoTRIgine (LAMICTAL) 150 MG tablet Take 1 tablet (150 mg total) by mouth 2 (two) times daily.    No facility-administered encounter medications on file as of 11/01/2019.     Goals Addressed            This Visit's Progress   . Collaborate with RN Care Manager to perform apprpriate assessments to determine care  management and care coordination needs       Current Barriers:  . Care management and care coordination needs related to DMII, ESRD, and multiple myeloma  which resulted in inpatient hospitalization . ADL IADL limitations  Clinical Social Work Clinical Goal(s):  [name] Over the next 60 days, patient will collaborate with care management team to assist with care coordination needs in response to recent inpatient hospitalization  CCM SW Interventions: Completed 11/01/19 with spouse [facility] . SW received inbound call from the patients spouse, Domenico Achord to review CAP/DA paperwork . Verbally assisted with application questions . Advised Mrs. Hatler to have provider complete clinical information prior to submission . Collaboration with provider office regarding application   Patient Self Care Activities:  . Attends all scheduled provider appointments . Calls pharmacy for medication refills . Calls provider office for new concerns or questions . Supportive wife and caregiver to assist with patient care needs   Please see past updates related to this goal by clicking on the "Past Updates" button in the selected goal        Follow Up Plan: SW will follow up with patient by phone over the next 3 weeks   [name], BSW, CDP Social Worker, Certified Dementia Practitioner [place] [facility] [place] [facility] [phone]  Total time spent performing care coordination and/or care management activities with the patient by phone or face to face = 5 minutes.

## 2019-11-01 NOTE — Patient Instructions (Signed)
[place] Discharge Instructions for Patients Receiving Chemotherapy  Today you received the following chemotherapy agents: Darzalex Faspro and Velcade.  To help prevent nausea and vomiting after your treatment, we encourage you to take your nausea medication as directed. If you develop nausea and vomiting that is not controlled by your nausea medication, call the clinic.   BELOW ARE SYMPTOMS THAT SHOULD BE REPORTED IMMEDIATELY:  *FEVER GREATER THAN 100.5 F  *CHILLS WITH OR WITHOUT FEVER  NAUSEA AND VOMITING THAT IS NOT CONTROLLED WITH YOUR NAUSEA MEDICATION  *UNUSUAL SHORTNESS OF BREATH  *UNUSUAL BRUISING OR BLEEDING  TENDERNESS IN MOUTH AND THROAT WITH OR WITHOUT PRESENCE OF ULCERS  *URINARY PROBLEMS  *BOWEL PROBLEMS  UNUSUAL RASH Items with * indicate a potential emergency and should be followed up as soon as possible.  Feel free to call the clinic you have any questions or concerns. The clinic phone number is [phone].  Please show the [place] at check-in to the Emergency Department and triage nurse.  Coronavirus (COVID-19) Are you at risk?  Are you at risk for the Coronavirus (COVID-19)?  To be considered HIGH RISK for Coronavirus (COVID-19), you have to meet the following criteria:  . Traveled to [place], [place], [place], [place] or [place]; or in the [place] to [place], [place], [place], or [place]; and have fever, cough, and shortness of breath within the last 2 weeks of travel OR . Been in close contact with a person diagnosed with COVID-19 within the last 2 weeks and have fever, cough, and shortness of breath . IF YOU DO NOT MEET THESE CRITERIA, YOU ARE CONSIDERED LOW RISK FOR COVID-19.  What to do if you are HIGH RISK for COVID-19?  [name] If you are having a medical emergency, call 911. . Seek medical care right away. Before you go to a doctor's office, urgent care or emergency department, call ahead and tell them about  your recent travel, contact with someone diagnosed with COVID-19, and your symptoms. You should receive instructions from your physician's office regarding next steps of care.  . When you arrive at healthcare provider, tell the healthcare staff immediately you have returned from visiting [place], [place], [place], [place] or [place]; or traveled in the [place] to [place], [place], [place], or [place]; in the last two weeks or you have been in close contact with a person diagnosed with COVID-19 in the last 2 weeks.   . Tell the health care staff about your symptoms: fever, cough and shortness of breath. . After you have been seen by a medical provider, you will be either: o Tested for (COVID-19) and discharged home on quarantine except to seek medical care if symptoms worsen, and asked to  - Stay home and avoid contact with others until you get your results (4-5 days)  - Avoid travel on public transportation if possible (such as bus, train, or airplane) or o Sent to the Emergency Department by EMS for evaluation, COVID-19 testing, and possible admission depending on your condition and test results.  What to do if you are LOW RISK for COVID-19?  Reduce your risk of any infection by using the same precautions used for avoiding the common cold or flu:  [name] Wash your hands often with soap and warm water for at least 20 seconds.  If soap and water are not readily available, use an alcohol-based hand sanitizer with at least 60% alcohol.  . If coughing or sneezing,  cover your mouth and nose by coughing or sneezing into the elbow areas of your shirt or coat, into a tissue or into your sleeve (not your hands). . Avoid shaking hands with others and consider head nods or verbal greetings only. . Avoid touching your eyes, nose, or mouth with unwashed hands.  . Avoid close contact with people who are sick. . Avoid places or events with large numbers of people in one location, like concerts or sporting  events. . Carefully consider travel plans you have or are making. . If you are planning any travel outside or inside the [place], visit the CDC's Travelers' Health webpage for the latest health notices. . If you have some symptoms but not all symptoms, continue to monitor at home and seek medical attention if your symptoms worsen. . If you are having a medical emergency, call 911.   [place] / e-Visit: [email or web address]         [facility]: [facility]: [phone]                   [facility]: [phone]

## 2019-11-01 NOTE — Progress Notes (Signed)
Critical value of creatinine at 7.76 called from lab and then reported to Isaiah Mola, RN who is working with Dr. Alen Blew.  Isaiah [name]

## 2019-11-01 NOTE — Progress Notes (Signed)
Pt states he took dexamethasone 20 mg at home this morning prior to treatment.

## 2019-11-02 DIAGNOSIS — N2581 Secondary hyperparathyroidism of renal origin: Secondary | ICD-10-CM | POA: Diagnosis not present

## 2019-11-02 DIAGNOSIS — D631 Anemia in chronic kidney disease: Secondary | ICD-10-CM | POA: Diagnosis not present

## 2019-11-02 DIAGNOSIS — E1129 Type 2 diabetes mellitus with other diabetic kidney complication: Secondary | ICD-10-CM | POA: Diagnosis not present

## 2019-11-02 DIAGNOSIS — N186 End stage renal disease: Secondary | ICD-10-CM | POA: Diagnosis not present

## 2019-11-02 DIAGNOSIS — Z992 Dependence on renal dialysis: Secondary | ICD-10-CM | POA: Diagnosis not present

## 2019-11-02 DIAGNOSIS — D509 Iron deficiency anemia, unspecified: Secondary | ICD-10-CM | POA: Diagnosis not present

## 2019-11-03 DIAGNOSIS — I120 Hypertensive chronic kidney disease with stage 5 chronic kidney disease or end stage renal disease: Secondary | ICD-10-CM | POA: Diagnosis not present

## 2019-11-03 DIAGNOSIS — E1122 Type 2 diabetes mellitus with diabetic chronic kidney disease: Secondary | ICD-10-CM | POA: Diagnosis not present

## 2019-11-03 DIAGNOSIS — N186 End stage renal disease: Secondary | ICD-10-CM | POA: Diagnosis not present

## 2019-11-03 DIAGNOSIS — I69320 Aphasia following cerebral infarction: Secondary | ICD-10-CM | POA: Diagnosis not present

## 2019-11-03 DIAGNOSIS — I69351 Hemiplegia and hemiparesis following cerebral infarction affecting right dominant side: Secondary | ICD-10-CM | POA: Diagnosis not present

## 2019-11-03 DIAGNOSIS — C9000 Multiple myeloma not having achieved remission: Secondary | ICD-10-CM | POA: Diagnosis not present

## 2019-11-04 DIAGNOSIS — I480 Paroxysmal atrial fibrillation: Secondary | ICD-10-CM | POA: Diagnosis not present

## 2019-11-04 DIAGNOSIS — E1140 Type 2 diabetes mellitus with diabetic neuropathy, unspecified: Secondary | ICD-10-CM | POA: Diagnosis not present

## 2019-11-04 DIAGNOSIS — I69320 Aphasia following cerebral infarction: Secondary | ICD-10-CM | POA: Diagnosis not present

## 2019-11-04 DIAGNOSIS — N2581 Secondary hyperparathyroidism of renal origin: Secondary | ICD-10-CM | POA: Diagnosis not present

## 2019-11-04 DIAGNOSIS — Z992 Dependence on renal dialysis: Secondary | ICD-10-CM | POA: Diagnosis not present

## 2019-11-04 DIAGNOSIS — G4730 Sleep apnea, unspecified: Secondary | ICD-10-CM | POA: Diagnosis not present

## 2019-11-04 DIAGNOSIS — M1030 Gout due to renal impairment, unspecified site: Secondary | ICD-10-CM | POA: Diagnosis not present

## 2019-11-04 DIAGNOSIS — I120 Hypertensive chronic kidney disease with stage 5 chronic kidney disease or end stage renal disease: Secondary | ICD-10-CM | POA: Diagnosis not present

## 2019-11-04 DIAGNOSIS — N186 End stage renal disease: Secondary | ICD-10-CM | POA: Diagnosis not present

## 2019-11-04 DIAGNOSIS — D631 Anemia in chronic kidney disease: Secondary | ICD-10-CM | POA: Diagnosis not present

## 2019-11-04 DIAGNOSIS — C9000 Multiple myeloma not having achieved remission: Secondary | ICD-10-CM | POA: Diagnosis not present

## 2019-11-04 DIAGNOSIS — D509 Iron deficiency anemia, unspecified: Secondary | ICD-10-CM | POA: Diagnosis not present

## 2019-11-04 DIAGNOSIS — I69351 Hemiplegia and hemiparesis following cerebral infarction affecting right dominant side: Secondary | ICD-10-CM | POA: Diagnosis not present

## 2019-11-04 DIAGNOSIS — Z8701 Personal history of pneumonia (recurrent): Secondary | ICD-10-CM | POA: Diagnosis not present

## 2019-11-04 DIAGNOSIS — Z86711 Personal history of pulmonary embolism: Secondary | ICD-10-CM | POA: Diagnosis not present

## 2019-11-04 DIAGNOSIS — E1129 Type 2 diabetes mellitus with other diabetic kidney complication: Secondary | ICD-10-CM | POA: Diagnosis not present

## 2019-11-04 DIAGNOSIS — E1122 Type 2 diabetes mellitus with diabetic chronic kidney disease: Secondary | ICD-10-CM | POA: Diagnosis not present

## 2019-11-07 DIAGNOSIS — Z23 Encounter for immunization: Secondary | ICD-10-CM | POA: Diagnosis not present

## 2019-11-07 DIAGNOSIS — E1122 Type 2 diabetes mellitus with diabetic chronic kidney disease: Secondary | ICD-10-CM | POA: Diagnosis not present

## 2019-11-07 DIAGNOSIS — C9000 Multiple myeloma not having achieved remission: Secondary | ICD-10-CM | POA: Diagnosis not present

## 2019-11-07 DIAGNOSIS — D631 Anemia in chronic kidney disease: Secondary | ICD-10-CM | POA: Diagnosis not present

## 2019-11-07 DIAGNOSIS — I69351 Hemiplegia and hemiparesis following cerebral infarction affecting right dominant side: Secondary | ICD-10-CM | POA: Diagnosis not present

## 2019-11-07 DIAGNOSIS — N186 End stage renal disease: Secondary | ICD-10-CM | POA: Diagnosis not present

## 2019-11-07 DIAGNOSIS — Z992 Dependence on renal dialysis: Secondary | ICD-10-CM | POA: Diagnosis not present

## 2019-11-07 DIAGNOSIS — D509 Iron deficiency anemia, unspecified: Secondary | ICD-10-CM | POA: Diagnosis not present

## 2019-11-07 DIAGNOSIS — E1129 Type 2 diabetes mellitus with other diabetic kidney complication: Secondary | ICD-10-CM | POA: Diagnosis not present

## 2019-11-07 DIAGNOSIS — I120 Hypertensive chronic kidney disease with stage 5 chronic kidney disease or end stage renal disease: Secondary | ICD-10-CM | POA: Diagnosis not present

## 2019-11-07 DIAGNOSIS — N2581 Secondary hyperparathyroidism of renal origin: Secondary | ICD-10-CM | POA: Diagnosis not present

## 2019-11-07 DIAGNOSIS — I69320 Aphasia following cerebral infarction: Secondary | ICD-10-CM | POA: Diagnosis not present

## 2019-11-07 DIAGNOSIS — Z5112 Encounter for antineoplastic immunotherapy: Secondary | ICD-10-CM | POA: Diagnosis not present

## 2019-11-08 ENCOUNTER — Other Ambulatory Visit: Payer: Self-pay

## 2019-11-08 ENCOUNTER — Telehealth: Payer: Self-pay

## 2019-11-08 ENCOUNTER — Inpatient Hospital Stay: Payer: Medicare Other

## 2019-11-08 ENCOUNTER — Inpatient Hospital Stay: Payer: Medicare Other | Attending: Oncology

## 2019-11-08 ENCOUNTER — Inpatient Hospital Stay (HOSPITAL_BASED_OUTPATIENT_CLINIC_OR_DEPARTMENT_OTHER): Payer: Medicare Other | Admitting: Oncology

## 2019-11-08 VITALS — BP 147/75 | HR 79 | Temp 97.8°F | Resp 18 | Wt 181.2 lb

## 2019-11-08 DIAGNOSIS — I69351 Hemiplegia and hemiparesis following cerebral infarction affecting right dominant side: Secondary | ICD-10-CM | POA: Diagnosis not present

## 2019-11-08 DIAGNOSIS — Z5112 Encounter for antineoplastic immunotherapy: Secondary | ICD-10-CM | POA: Insufficient documentation

## 2019-11-08 DIAGNOSIS — C9000 Multiple myeloma not having achieved remission: Secondary | ICD-10-CM | POA: Insufficient documentation

## 2019-11-08 DIAGNOSIS — E1122 Type 2 diabetes mellitus with diabetic chronic kidney disease: Secondary | ICD-10-CM | POA: Diagnosis not present

## 2019-11-08 DIAGNOSIS — N186 End stage renal disease: Secondary | ICD-10-CM | POA: Diagnosis not present

## 2019-11-08 DIAGNOSIS — I69320 Aphasia following cerebral infarction: Secondary | ICD-10-CM | POA: Diagnosis not present

## 2019-11-08 DIAGNOSIS — I120 Hypertensive chronic kidney disease with stage 5 chronic kidney disease or end stage renal disease: Secondary | ICD-10-CM | POA: Diagnosis not present

## 2019-11-08 LAB — CBC WITH DIFFERENTIAL (CANCER CENTER ONLY)
Abs Immature Granulocytes: 0.02 10*3/uL (ref 0.00–0.07)
Basophils Absolute: 0.1 10*3/uL (ref 0.0–0.1)
Basophils Relative: 1 %
Eosinophils Absolute: 0.5 10*3/uL (ref 0.0–0.5)
Eosinophils Relative: 7 %
Hemoglobin: 10.6 g/dL — ABNORMAL LOW (ref 13.0–17.0)
Immature Granulocytes: 0 %
Lymphocytes Relative: 7 %
Lymphs Abs: 0.5 10*3/uL — ABNORMAL LOW (ref 0.7–4.0)
MCH: 35.5 pg — ABNORMAL HIGH (ref 26.0–34.0)
MCV: 93.3 fL (ref 80.0–100.0)
Monocytes Absolute: 1 10*3/uL (ref 0.1–1.0)
Monocytes Relative: 15 %
Neutro Abs: 4.8 10*3/uL (ref 1.7–7.7)
Neutrophils Relative %: 70 %
Platelet Count: 236 10*3/uL (ref 150–400)
WBC Count: 6.8 10*3/uL (ref 4.0–10.5)
nRBC: 32.5 % — ABNORMAL HIGH (ref 0.0–0.2)

## 2019-11-08 LAB — CMP (CANCER CENTER ONLY)
ALT: 7 U/L (ref 0–44)
AST: 12 U/L — ABNORMAL LOW (ref 15–41)
Albumin: 4.2 g/dL (ref 3.5–5.0)
Alkaline Phosphatase: 107 U/L (ref 38–126)
Anion gap: 16 — ABNORMAL HIGH (ref 5–15)
BUN: 30 mg/dL — ABNORMAL HIGH (ref 6–20)
CO2: 31 mmol/L (ref 22–32)
Calcium: 9.2 mg/dL (ref 8.9–10.3)
Chloride: 96 mmol/L — ABNORMAL LOW (ref 98–111)
Creatinine: 8.49 mg/dL (ref 0.61–1.24)
GFR, Est AFR Am: 7 mL/min — ABNORMAL LOW (ref >60)
GFR, Estimated: 6 mL/min — ABNORMAL LOW (ref >60)
Glucose, Bld: 148 mg/dL — ABNORMAL HIGH (ref 70–99)
Potassium: 4.3 mmol/L (ref 3.5–5.1)
Sodium: 143 mmol/L (ref 135–145)
Total Bilirubin: 1.5 mg/dL — ABNORMAL HIGH (ref 0.3–1.2)
Total Protein: 6.9 g/dL (ref 6.5–8.1)

## 2019-11-08 MED ORDER — PROCHLORPERAZINE MALEATE 10 MG PO TABS
ORAL_TABLET | ORAL | Status: AC
  Filled 2019-11-08: qty 1

## 2019-11-08 MED ORDER — DIPHENHYDRAMINE HCL 25 MG PO CAPS
ORAL_CAPSULE | ORAL | Status: AC
  Filled 2019-11-08: qty 2

## 2019-11-08 MED ORDER — ACETAMINOPHEN 325 MG PO TABS
ORAL_TABLET | ORAL | Status: AC
  Filled 2019-11-08: qty 2

## 2019-11-08 MED ORDER — PROCHLORPERAZINE MALEATE 10 MG PO TABS
10.0000 mg | ORAL_TABLET | Freq: Once | ORAL | Status: AC
  Administered 2019-11-08: 10 mg via ORAL

## 2019-11-08 MED ORDER — DARATUMUMAB-HYALURONIDASE-FIHJ 1800-30000 MG-UT/15ML ~~LOC~~ SOLN
1800.0000 mg | Freq: Once | SUBCUTANEOUS | Status: AC
  Administered 2019-11-08: 1800 mg via SUBCUTANEOUS
  Filled 2019-11-08: qty 15

## 2019-11-08 MED ORDER — ACETAMINOPHEN 325 MG PO TABS
650.0000 mg | ORAL_TABLET | Freq: Once | ORAL | Status: AC
  Administered 2019-11-08: 650 mg via ORAL

## 2019-11-08 MED ORDER — BORTEZOMIB CHEMO SQ INJECTION 3.5 MG (2.5MG/ML)
1.5000 mg/m2 | Freq: Once | INTRAMUSCULAR | Status: AC
  Administered 2019-11-08: 3 mg via SUBCUTANEOUS
  Filled 2019-11-08: qty 1.2

## 2019-11-08 MED ORDER — DIPHENHYDRAMINE HCL 25 MG PO CAPS
50.0000 mg | ORAL_CAPSULE | Freq: Once | ORAL | Status: AC
  Administered 2019-11-08: 50 mg via ORAL

## 2019-11-08 NOTE — Telephone Encounter (Signed)
Lab called with Scr 8.49. Dr. Alen Blew made aware. No new orders at this time.

## 2019-11-08 NOTE — Patient Instructions (Signed)
[facility] Discharge Instructions for Patients Receiving Chemotherapy  Today you received the following chemotherapy agents: Darzalex Faspro and Velcade.  To help prevent nausea and vomiting after your treatment, we encourage you to take your nausea medication as directed. If you develop nausea and vomiting that is not controlled by your nausea medication, call the clinic.   BELOW ARE SYMPTOMS THAT SHOULD BE REPORTED IMMEDIATELY:  *FEVER GREATER THAN 100.5 F  *CHILLS WITH OR WITHOUT FEVER  NAUSEA AND VOMITING THAT IS NOT CONTROLLED WITH YOUR NAUSEA MEDICATION  *UNUSUAL SHORTNESS OF BREATH  *UNUSUAL BRUISING OR BLEEDING  TENDERNESS IN MOUTH AND THROAT WITH OR WITHOUT PRESENCE OF ULCERS  *URINARY PROBLEMS  *BOWEL PROBLEMS  UNUSUAL RASH Items with * indicate a potential emergency and should be followed up as soon as possible.  Feel free to call the clinic you have any questions or concerns. The clinic phone number is [phone].  Please show the [facility] at check-in to the Emergency Department and triage nurse.  Coronavirus (COVID-19) Are you at risk?  Are you at risk for the Coronavirus (COVID-19)?  To be considered HIGH RISK for Coronavirus (COVID-19), you have to meet the following criteria:  . Traveled to [place], [place], [place], [place] or [place]; or in the [place] to [place], [place], [place], or [place]; and have fever, cough, and shortness of breath within the last 2 weeks of travel OR . Been in close contact with a person diagnosed with COVID-19 within the last 2 weeks and have fever, cough, and shortness of breath . IF YOU DO NOT MEET THESE CRITERIA, YOU ARE CONSIDERED LOW RISK FOR COVID-19.  What to do if you are HIGH RISK for COVID-19?  [name] If you are having a medical emergency, call 911. . Seek medical care right away. Before you go to a doctor's office, urgent care or emergency department, call ahead and tell them about  your recent travel, contact with someone diagnosed with COVID-19, and your symptoms. You should receive instructions from your physician's office regarding next steps of care.  . When you arrive at healthcare provider, tell the healthcare staff immediately you have returned from visiting [place], [place], [place], [place] or [place]; or traveled in the [place] to [place], [place], [place], or [place]; in the last two weeks or you have been in close contact with a person diagnosed with COVID-19 in the last 2 weeks.   . Tell the health care staff about your symptoms: fever, cough and shortness of breath. . After you have been seen by a medical provider, you will be either: o Tested for (COVID-19) and discharged home on quarantine except to seek medical care if symptoms worsen, and asked to  - Stay home and avoid contact with others until you get your results (4-5 days)  - Avoid travel on public transportation if possible (such as bus, train, or airplane) or o Sent to the Emergency Department by EMS for evaluation, COVID-19 testing, and possible admission depending on your condition and test results.  What to do if you are LOW RISK for COVID-19?  Reduce your risk of any infection by using the same precautions used for avoiding the common cold or flu:  [name] Wash your hands often with soap and warm water for at least 20 seconds.  If soap and water are not readily available, use an alcohol-based hand sanitizer with at least 60% alcohol.  . If coughing or sneezing,  cover your mouth and nose by coughing or sneezing into the elbow areas of your shirt or coat, into a tissue or into your sleeve (not your hands). . Avoid shaking hands with others and consider head nods or verbal greetings only. . Avoid touching your eyes, nose, or mouth with unwashed hands.  . Avoid close contact with people who are sick. . Avoid places or events with large numbers of people in one location, like concerts or sporting  events. . Carefully consider travel plans you have or are making. . If you are planning any travel outside or inside the [place], visit the CDC's Travelers' Health webpage for the latest health notices. . If you have some symptoms but not all symptoms, continue to monitor at home and seek medical attention if your symptoms worsen. . If you are having a medical emergency, call 911.   [facility] / e-Visit: [email or web address]         [facility]: [facility]: [phone]                   [facility]: [phone]

## 2019-11-08 NOTE — Progress Notes (Signed)
Hematology and Oncology Follow Up Visit  [place] [id number] 09-Aug-1966 54 y.o. 11/08/2019 9:29 AM    Principle Diagnosis: 54 year old man with IgG kappa multiple myeloma arising from MGUS diagnosed in 2019.    Past therapy: Velcade dexamethasone and cyclophosphamide weekly started on 05/25/2018.  Velcade is given at 1.5 mg/m weekly, dexamethasone is 20 mg weekly and and Cytoxan is given at 650 mg total dose.  Last treatment was given in August 2020.    Therapy was held today in anticipation of high-dose chemotherapy and autologous stem cell transplant.  He did not receive stem cell transplant due to subdural hematoma and sepsis in September 2020.  Current therapy:  Velcade at 1.5 mg per metered squared subcutaneously weekly, dexamethasone 20 mg weekly, daratumumab 1800 mg subcutaneous injection weekly.  Cycle 1 started on 08/30/2019. He is here for cycle 10 of therapy.  Interim History:  Mr. Cerullo returns today for a follow-up visit.  Since the last visit, he continues to receive therapy without any major complications.  He denies any nausea, vomiting or abdominal pain.  He denies any excessive fatigue tiredness.  He does report some occasional nausea and decrease in his appetite but no hospitalization or illnesses.  Continues participate in physical therapy and ambulates with the help of a walker with assistance.  He denies any recent falls or syncope.  Denies any worsening neuropathy.                       Medications: Updated today.  Current Outpatient Medications:  .  acetaminophen (TYLENOL) 325 MG tablet, Take 1-2 tablets (325-650 mg total) by mouth every 4 (four) hours as needed for mild pain., Disp:  , Rfl:  .  acyclovir (ZOVIRAX) 400 MG tablet, Take 400 mg by mouth at bedtime., Disp: , Rfl:  .  albuterol (PROAIR HFA) 108 (90 Base) MCG/ACT inhaler, Inhale two puffs every 4-6 hours if needed for cough or wheeze, Disp: 1 Inhaler, Rfl: 1 .  Alcohol Swabs (SM  ALCOHOL PREP) 70 % PADS, , Disp: , Rfl:  .  brimonidine (ALPHAGAN) 0.2 % ophthalmic solution, , Disp: , Rfl:  .  calcitRIOL (ROCALTROL) 0.25 MCG capsule, Take by mouth 3 (three) times a week., Disp: , Rfl:  .  calcium carbonate (TUMS - DOSED IN MG ELEMENTAL CALCIUM) 500 MG chewable tablet, Chew 2 tablets by mouth at bedtime., Disp: , Rfl:  .  camphor-menthol (SARNA) lotion, Apply 1 application topically every 8 (eight) hours as needed for itching., Disp: 222 mL, Rfl: 0 .  Cetirizine HCl 10 MG CAPS, Take 1 capsule by mouth daily., Disp: , Rfl:  .  cinacalcet (SENSIPAR) 60 MG tablet, Take 60 mg by mouth every evening. ON HOLD FOR DIALYSIS PER WIFE, Disp: , Rfl:  .  clonazePAM (KLONOPIN) 0.5 MG tablet, Take 2 tablets every Monday, Wednesday, and Friday with hemodialysis, Disp: 30 tablet, Rfl: 5 .  DARZALEX FASPRO 1800-30000 MG-UT/15ML SOLN, Inject 1, Disp: , Rfl:  .  dexamethasone (DECADRON) 4 MG tablet, Take 5 tablets (20 mg total) by mouth once a week. Take 5 tablets (20 mg total) by mouth once a week on day of chemo., Disp: 40 tablet, Rfl: 3 .  fluticasone furoate-vilanterol (BREO ELLIPTA) 200-25 MCG/INH AEPB, Inhale 1 puff into the lungs daily., Disp: , Rfl:  .  FREESTYLE LITE test strip, , Disp: , Rfl:  .  hydrOXYzine (ATARAX/VISTARIL) 25 MG tablet, Take 1 tablet (25 mg total) by mouth every 8 (  eight) hours as needed for itching., Disp: 30 tablet, Rfl: 0 .  Insulin Lispro (HUMALOG KWIKPEN [place]), Inject 2-4 Units into the skin 3 (three) times daily before meals. Sliding scale: 150-250 =2 units, 250 -350=3 units, 350 += 4 units, Disp: , Rfl:  .  ketorolac (ACULAR) 0.5 % ophthalmic solution, Place 1 drop into both eyes 4 (four) times daily., Disp: , Rfl:  .  lacosamide (VIMPAT) 200 MG TABS tablet, Take 1 tablet twice a day except on dialysis days M-W-F give 1 tab in AM, 1 tab after dialysis, 1 tab in PM., Disp: 90 tablet, Rfl: 5 .  lamoTRIgine (LAMICTAL) 200 MG tablet, Take 1 tablet (200 mg total) by  mouth 2 (two) times daily., Disp: 60 tablet, Rfl: 11 .  Lancets (FREESTYLE) lancets, , Disp: , Rfl:  .  Methoxy PEG-Epoetin Beta (MIRCERA IJ), Mircera, Disp: , Rfl:  .  midodrine (PROAMATINE) 5 MG tablet, Take one tablet by mouth 30 minutes before dialysis., Disp: 30 tablet, Rfl: 1 .  multivitamin (RENA-VIT) TABS tablet, Take 1 tablet by mouth at bedtime., Disp: , Rfl:  .  Nutritional Supplements (FEEDING SUPPLEMENT, NEPRO CARB STEADY,) LIQD, Take 237 mLs by mouth 3 (three) times daily as needed (Supplement)., Disp: 237 mL, Rfl: 12 .  ofloxacin (OCUFLOX) 0.3 % ophthalmic solution, Apply to eye., Disp: , Rfl:  .  pantoprazole (PROTONIX) 40 MG tablet, Take 40 mg by mouth daily., Disp: , Rfl:  .  prednisoLONE acetate (PRED FORTE) 1 % ophthalmic suspension, Place 1 drop into the left eye daily. , Disp: , Rfl:  .  prochlorperazine (COMPAZINE) 10 MG tablet, TAKE 1 TABLET (10 MG TOTAL) BY MOUTH EVERY 6 (SIX) HOURS AS NEEDED FOR NAUSEA OR VOMITING., Disp: 30 tablet, Rfl: 0  Allergies:  Allergies  Allergen Reactions  . Codeine Rash and Other (See Comments)    Unknown reaction (patient says it was more serious than just a rash, but he can't remember what happened) Unknown reaction (patient says it was more serious than just a rash, but he can't remember what happened)  . Codeine Swelling      Physical Exam:    Blood pressure (!) 147/75, pulse 79, temperature 97.8 F (36.6 C), temperature source Temporal, resp. rate 18, weight 181 lb 3.2 oz (82.2 kg), SpO2 100 %.     ECOG: 1   General appearance: Alert, awake without any distress. Head: Atraumatic without abnormalities Oropharynx: Without any thrush or ulcers. Eyes: No scleral icterus. Lymph nodes: No lymphadenopathy noted in the cervical, supraclavicular, or axillary nodes Heart:regular rate and rhythm, without any murmurs or gallops.   Lung: Clear to auscultation without any rhonchi, wheezes or dullness to percussion. Abdomin: Soft,  nontender without any shifting dullness or ascites. Musculoskeletal: No clubbing or cyanosis. Neurological: No motor or sensory deficits. Skin: No rashes or lesions.  .                Lab Results: Lab Results  Component Value Date   WBC 8.2 11/01/2019   HGB 9.8 (L) 11/01/2019   HCT 26.2 (L) 11/01/2019   MCV 91.8 11/01/2019   PLT 263 11/01/2019     Chemistry      Component Value Date/Time   NA 138 11/01/2019 0910   NA 138 07/03/2017 0813   K 4.1 11/01/2019 0910   K 4.2 07/03/2017 0813   CL 94 (L) 11/01/2019 0910   CO2 31 11/01/2019 0910   CO2 27 07/03/2017 0813   BUN 29 (H)  11/01/2019 0910   BUN 44 (A) 08/17/2019 0000   BUN 46.4 (H) 07/03/2017 0813   CREATININE 7.76 (HH) 11/01/2019 0910   CREATININE 10.9 (HH) 07/03/2017 0813   GLU 145 08/17/2019 0000      Component Value Date/Time   CALCIUM 9.4 11/01/2019 0910   CALCIUM 9.3 07/03/2017 0813   ALKPHOS 98 11/01/2019 0910   ALKPHOS 77 07/03/2017 0813   AST 9 (L) 11/01/2019 0910   AST 16 07/03/2017 0813   ALT 7 11/01/2019 0910   ALT 14 07/03/2017 0813   BILITOT 1.0 11/01/2019 0910   BILITOT 0.93 07/03/2017 0813      Results for ESAI, STECKLEIN (MRN [id number]) as of 11/08/2019 09:24  Ref. Range 10/11/2019 13:12 10/18/2019 10:02  M Protein SerPl Elph-Mcnc Latest Ref Range: Not Observed g/dL 0.2 (H) 0.2 (H)     Impression and Plan:  54 year old man with:  1.  IgG kappa multiple myeloma diagnosed in 2019 after presenting with over 20% involvement of the bone marrow.  He continues to report no major complications related to his current therapy.  Protein studies obtained in February 2021 showed an M spike of 0.2 g/dL.  His IgG level remains within normal range with improving kappa to lambda ratio.  Risks and benefits of continuing this therapy long-term was reviewed.  Potential complications including neuropathy, GI toxicity among others were reviewed.  He is agreeable to proceed at this time I will continue to  monitor monthly.    2. Anemia: Multifactorial in nature related to renal insufficiency, malignancy and chemotherapy.  Hemoglobin continues to be adequate without any transfusion.   2.  Antiemetics: Compazine available to him without any recent nausea or vomiting.  4.  Renal failure: Continues to be dialysis dependent with same schedule.  5.  Weakness and debilitation: He continues to participate in physical therapy with improvement in his overall mobility.  6.  Goals of care: His disease is incurable although aggressive measures are warranted given his reasonable performance status.  7.  Hypotension: His blood pressure is adequate today without any major changes.  8. Follow-up: In 1 week for the next cycle of chemotherapy we will continue to follow weekly regarding that.  We will have MD follow-up in 4 weeks.  30 minutes were spent on this encounter.  The time was dedicated to reviewing his disease status, treatment options and addressing complications related to therapy.     Zola Button, MD 3/2/20219:29 AM

## 2019-11-09 ENCOUNTER — Telehealth: Payer: Self-pay | Admitting: Oncology

## 2019-11-09 DIAGNOSIS — D631 Anemia in chronic kidney disease: Secondary | ICD-10-CM | POA: Diagnosis not present

## 2019-11-09 DIAGNOSIS — Z992 Dependence on renal dialysis: Secondary | ICD-10-CM | POA: Diagnosis not present

## 2019-11-09 DIAGNOSIS — N2581 Secondary hyperparathyroidism of renal origin: Secondary | ICD-10-CM | POA: Diagnosis not present

## 2019-11-09 DIAGNOSIS — Z23 Encounter for immunization: Secondary | ICD-10-CM | POA: Diagnosis not present

## 2019-11-09 DIAGNOSIS — D509 Iron deficiency anemia, unspecified: Secondary | ICD-10-CM | POA: Diagnosis not present

## 2019-11-09 DIAGNOSIS — E1129 Type 2 diabetes mellitus with other diabetic kidney complication: Secondary | ICD-10-CM | POA: Diagnosis not present

## 2019-11-09 DIAGNOSIS — N186 End stage renal disease: Secondary | ICD-10-CM | POA: Diagnosis not present

## 2019-11-09 NOTE — Telephone Encounter (Signed)
Scheduled appt per 3/2 los.

## 2019-11-10 ENCOUNTER — Telehealth: Payer: Self-pay

## 2019-11-10 ENCOUNTER — Encounter (INDEPENDENT_AMBULATORY_CARE_PROVIDER_SITE_OTHER): Payer: Medicare Other | Admitting: Ophthalmology

## 2019-11-10 DIAGNOSIS — I10 Essential (primary) hypertension: Secondary | ICD-10-CM | POA: Diagnosis not present

## 2019-11-10 DIAGNOSIS — H3523 Other non-diabetic proliferative retinopathy, bilateral: Secondary | ICD-10-CM

## 2019-11-10 DIAGNOSIS — H43813 Vitreous degeneration, bilateral: Secondary | ICD-10-CM | POA: Diagnosis not present

## 2019-11-10 DIAGNOSIS — H35033 Hypertensive retinopathy, bilateral: Secondary | ICD-10-CM

## 2019-11-10 IMAGING — CT CT HEAD W/O CM
4 series · 15 of 47 positions shown, 17 images · non-contrast
Comparison: Head CT 06/12/2019, brain MRI 01/07/2016

CLINICAL DATA: Neuro deficit(s), subacute. Additional history:
Patient was working with therapy and had sudden decrease in
mobility, minimal assist this morning, now max assist, patient
reports slight blurred vision as well as nausea earlier.

EXAM:
CT HEAD WITHOUT CONTRAST
TECHNIQUE: Contiguous axial images were obtained from the base of the skull
through the vertex without intravenous contrast.

[Series 3: head without · axial · non-contrast · 0.39mm/px · z∈[-108,+17]mm · 7 of 35 slices shown, 9 images]
[im 5/35  brain]
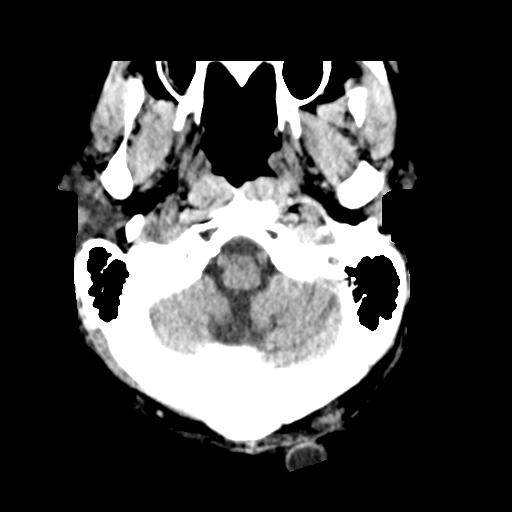
[im 5/35  bone]
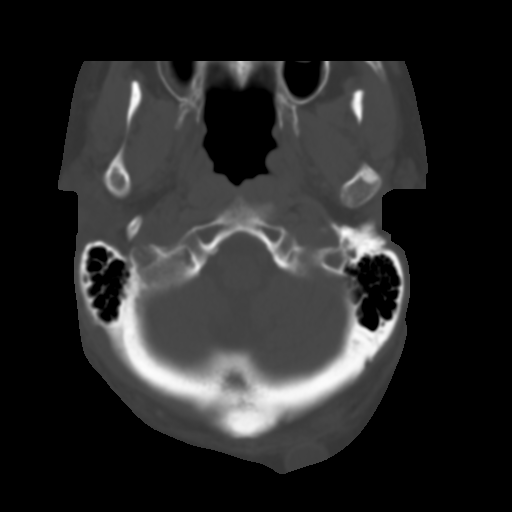
[im 9/35  brain]
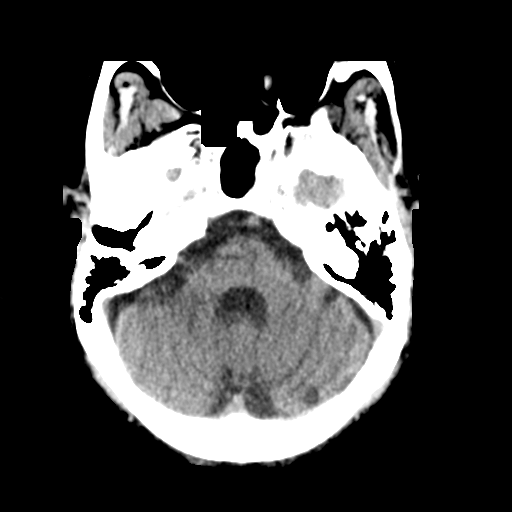
[im 13/35  brain]
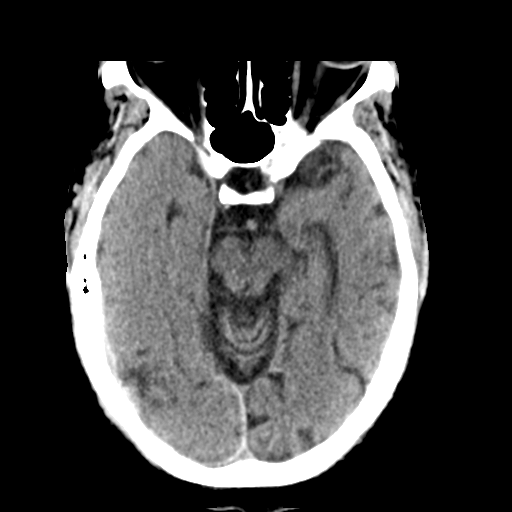
[im 18/35  brain]
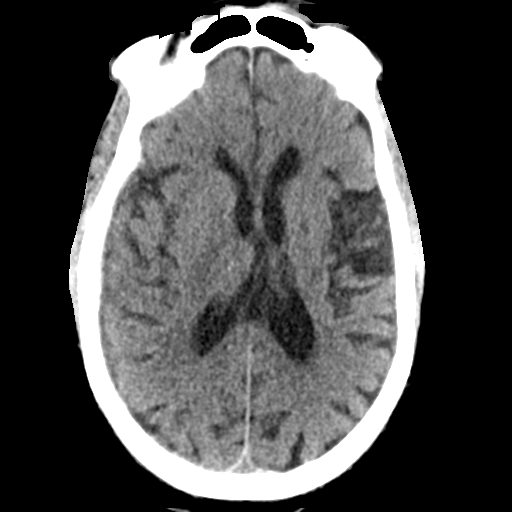
[im 22/35  brain]
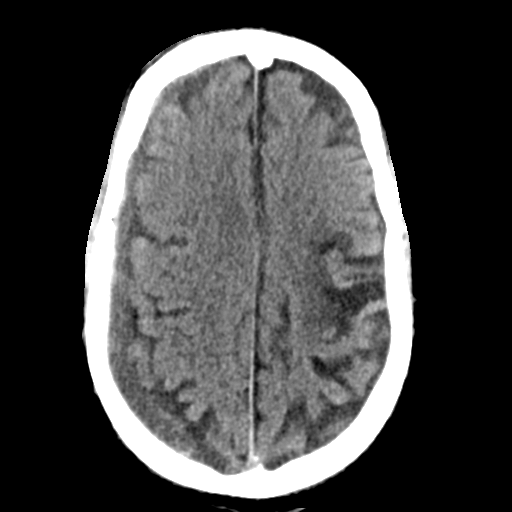
[im 22/35  bone]
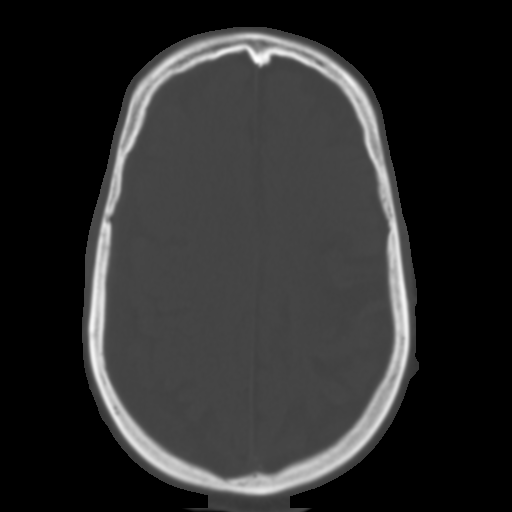
[im 26/35  brain]
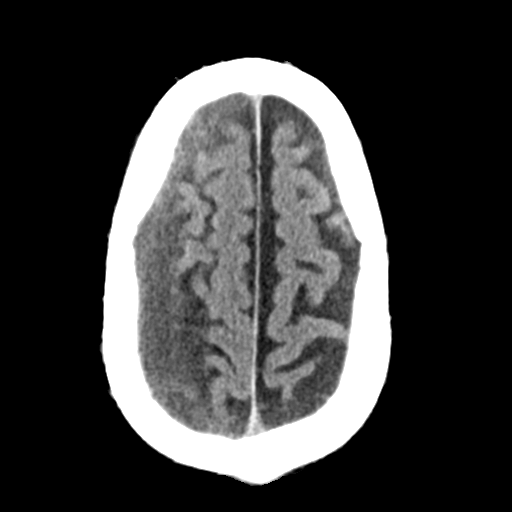
[im 30/35  brain]
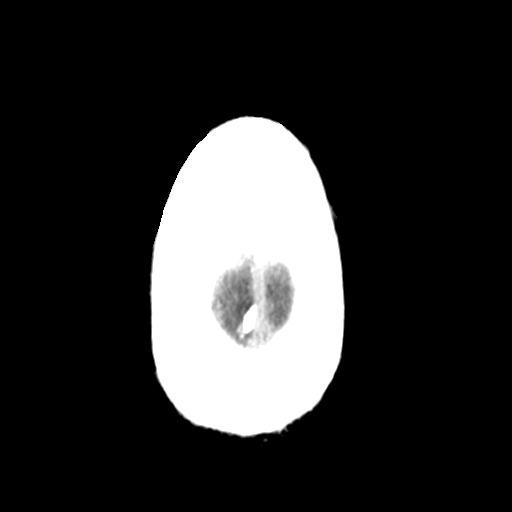

[Series 4: head bone · axial · 0.39mm/px · z∈[-112,-94]mm · 2 of 86 slices shown]
[im 9/86  bone]
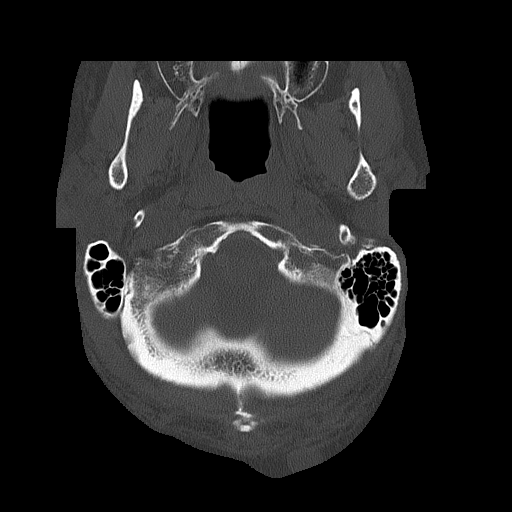
[im 18/86  bone]
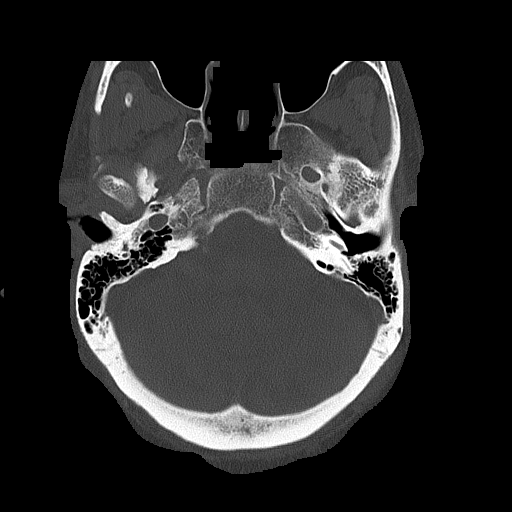

[Series 5: head without cor · coronal · non-contrast · 0.28mm/px · 3 of 69 slices shown]
[im 25/69  brain]
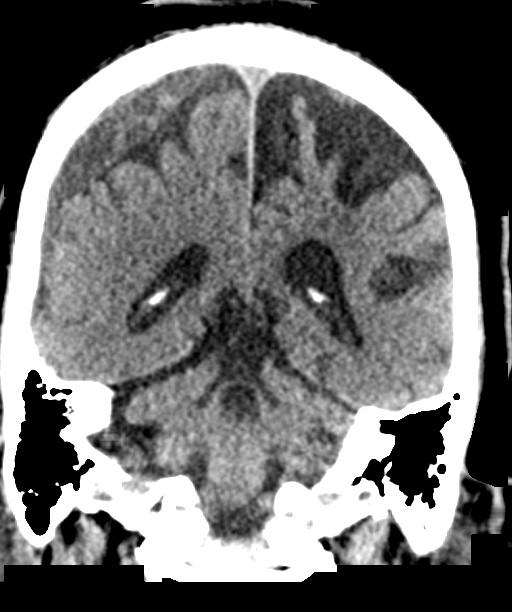
[im 31/69  brain]
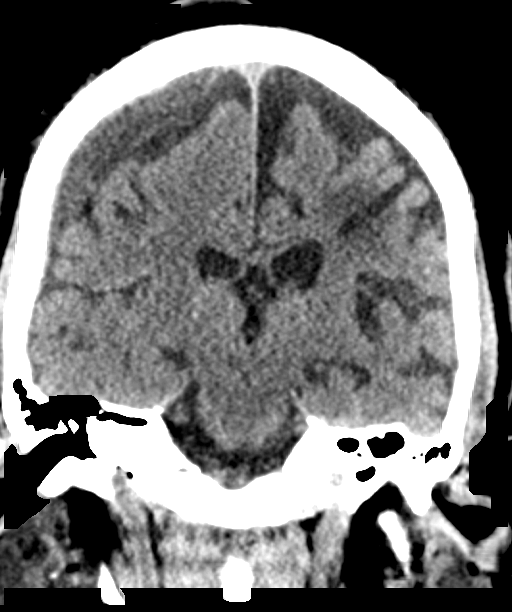
[im 38/69  brain]
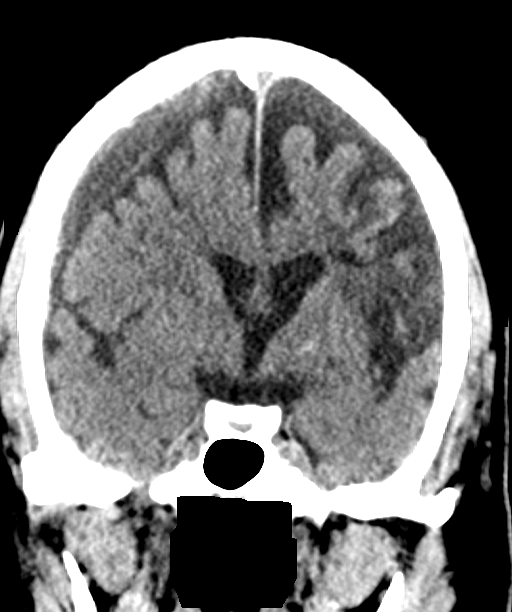

[Series 6: head without sag · sagittal · non-contrast · 0.34mm/px · 3 of 49 slices shown]
[im 17/49  brain]
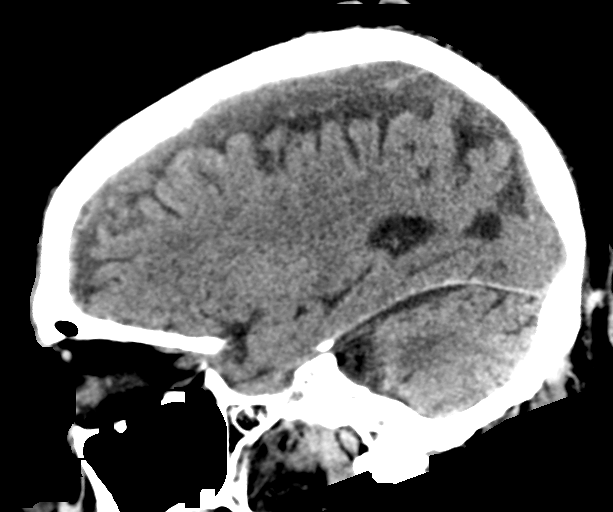
[im 25/49  brain]
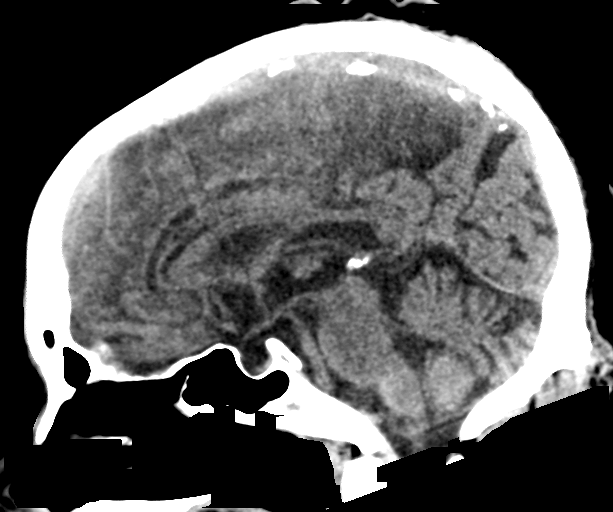
[im 33/49  brain]
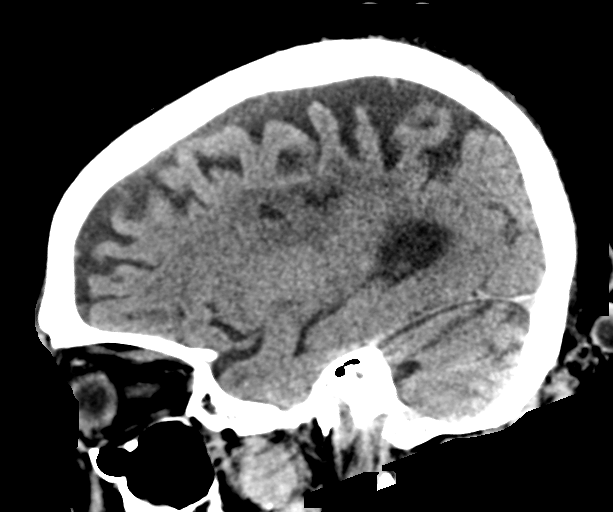

[15 of 47 positions shown; findings below may reference images not displayed]

FINDINGS: Brain:

Redemonstrated right subdural collection, greatest overlying the
frontoparietal lobes, and measuring up to 1.5 cm in thickness
(series 5, image 37). As before, the collection is predominantly
intermediate density. However, there are subtle persistent internal
foci of hyperdensity which may reflect small components of
acute/subacute blood products and/or septations. There is mass
effect upon the underlying right cerebral hemisphere. Partial
effacement of the right lateral ventricle. 4 mm leftward midline
shift.

Again demonstrated are chronic infarcts within the left frontal
lobe, right frontal and parietal lobes, right temporal occipital
lobe, and bilateral cerebellar hemispheres. No new infarct is
definitively identified. Ill-defined hypoattenuation of the cerebral
white matter is nonspecific, but consistent with chronic small
vessel ischemic disease.

No evidence of intracranial mass. Mild generalized parenchymal
atrophy. Partially empty sella turcica.

Vascular: No hyperdense vessel. Atherosclerotic calcification of the
carotid artery siphons and vertebrobasilar system.

Skull: No calvarial fracture

Sinuses/Orbits: Visualized orbits demonstrate no acute abnormality.
Mild scattered paranasal sinus mucosal thickening. Moderate-sized
left maxillary sinus mucous retention cyst. No significant mastoid
effusion.

These results were called by telephone at the time of interpretation
on 06/20/2019 at [DATE] to provider QUIRIJN AMAZIGH , who verbally
acknowledged these results.
IMPRESSION: 1. Mixed density right subdural hematoma not significantly changed
as compared to head CT 06/12/2019. Stable mass effect on the right
cerebral hemisphere with 4 mm leftward midline shift.
2. Multiple redemonstrated chronic infarcts as detailed. No new
infarct is identified, although extensive chronic changes somewhat
limit evaluation. Consider brain MRI for further evaluation, if
clinically warranted.
3. Generalized atrophy with chronic small vessel ischemic disease.

## 2019-11-10 NOTE — Telephone Encounter (Signed)
The pt's wife was told that Dr. Baird Cancer wants to know if the paperwork was for Dr. Baird Cancer to make a referral to the CAP program and she said yes and yes that she has spoken to [name] and Tillie Rung and that she was trying to get some home health services and medicaid for the pt because angel said that the company would go off of the pt's current financial status.

## 2019-11-11 DIAGNOSIS — D509 Iron deficiency anemia, unspecified: Secondary | ICD-10-CM | POA: Diagnosis not present

## 2019-11-11 DIAGNOSIS — E1129 Type 2 diabetes mellitus with other diabetic kidney complication: Secondary | ICD-10-CM | POA: Diagnosis not present

## 2019-11-11 DIAGNOSIS — N2581 Secondary hyperparathyroidism of renal origin: Secondary | ICD-10-CM | POA: Diagnosis not present

## 2019-11-11 DIAGNOSIS — N186 End stage renal disease: Secondary | ICD-10-CM | POA: Diagnosis not present

## 2019-11-11 DIAGNOSIS — Z23 Encounter for immunization: Secondary | ICD-10-CM | POA: Diagnosis not present

## 2019-11-11 DIAGNOSIS — Z992 Dependence on renal dialysis: Secondary | ICD-10-CM | POA: Diagnosis not present

## 2019-11-11 DIAGNOSIS — D631 Anemia in chronic kidney disease: Secondary | ICD-10-CM | POA: Diagnosis not present

## 2019-11-12 DIAGNOSIS — G40909 Epilepsy, unspecified, not intractable, without status epilepticus: Secondary | ICD-10-CM | POA: Diagnosis not present

## 2019-11-12 DIAGNOSIS — F445 Conversion disorder with seizures or convulsions: Secondary | ICD-10-CM | POA: Diagnosis not present

## 2019-11-12 DIAGNOSIS — R269 Unspecified abnormalities of gait and mobility: Secondary | ICD-10-CM | POA: Diagnosis not present

## 2019-11-12 DIAGNOSIS — G8110 Spastic hemiplegia affecting unspecified side: Secondary | ICD-10-CM | POA: Diagnosis not present

## 2019-11-12 DIAGNOSIS — R2689 Other abnormalities of gait and mobility: Secondary | ICD-10-CM | POA: Diagnosis not present

## 2019-11-14 DIAGNOSIS — Z23 Encounter for immunization: Secondary | ICD-10-CM | POA: Diagnosis not present

## 2019-11-14 DIAGNOSIS — N2581 Secondary hyperparathyroidism of renal origin: Secondary | ICD-10-CM | POA: Diagnosis not present

## 2019-11-14 DIAGNOSIS — Z992 Dependence on renal dialysis: Secondary | ICD-10-CM | POA: Diagnosis not present

## 2019-11-14 DIAGNOSIS — D631 Anemia in chronic kidney disease: Secondary | ICD-10-CM | POA: Diagnosis not present

## 2019-11-14 DIAGNOSIS — N186 End stage renal disease: Secondary | ICD-10-CM | POA: Diagnosis not present

## 2019-11-14 DIAGNOSIS — D509 Iron deficiency anemia, unspecified: Secondary | ICD-10-CM | POA: Diagnosis not present

## 2019-11-14 DIAGNOSIS — E1129 Type 2 diabetes mellitus with other diabetic kidney complication: Secondary | ICD-10-CM | POA: Diagnosis not present

## 2019-11-14 MED FILL — KETOROLAC 0.5% OPHTH SOLUTI: 0.5 | 25 days supply | Qty: 10 | Fill #0

## 2019-11-15 ENCOUNTER — Inpatient Hospital Stay: Payer: Medicare Other

## 2019-11-15 ENCOUNTER — Telehealth: Payer: Self-pay

## 2019-11-15 ENCOUNTER — Other Ambulatory Visit: Payer: Self-pay

## 2019-11-15 VITALS — BP 121/66 | HR 85 | Temp 98.5°F | Resp 18

## 2019-11-15 DIAGNOSIS — C9000 Multiple myeloma not having achieved remission: Secondary | ICD-10-CM

## 2019-11-15 DIAGNOSIS — I69351 Hemiplegia and hemiparesis following cerebral infarction affecting right dominant side: Secondary | ICD-10-CM | POA: Diagnosis not present

## 2019-11-15 DIAGNOSIS — E1122 Type 2 diabetes mellitus with diabetic chronic kidney disease: Secondary | ICD-10-CM | POA: Diagnosis not present

## 2019-11-15 DIAGNOSIS — N186 End stage renal disease: Secondary | ICD-10-CM | POA: Diagnosis not present

## 2019-11-15 DIAGNOSIS — I69320 Aphasia following cerebral infarction: Secondary | ICD-10-CM | POA: Diagnosis not present

## 2019-11-15 DIAGNOSIS — Z5112 Encounter for antineoplastic immunotherapy: Secondary | ICD-10-CM | POA: Diagnosis not present

## 2019-11-15 DIAGNOSIS — E109 Type 1 diabetes mellitus without complications: Secondary | ICD-10-CM | POA: Diagnosis not present

## 2019-11-15 DIAGNOSIS — I120 Hypertensive chronic kidney disease with stage 5 chronic kidney disease or end stage renal disease: Secondary | ICD-10-CM | POA: Diagnosis not present

## 2019-11-15 LAB — CBC WITH DIFFERENTIAL (CANCER CENTER ONLY)
Abs Immature Granulocytes: 0.04 10*3/uL (ref 0.00–0.07)
Basophils Absolute: 0.1 10*3/uL (ref 0.0–0.1)
Basophils Relative: 1 %
Eosinophils Absolute: 0.3 10*3/uL (ref 0.0–0.5)
Eosinophils Relative: 3 %
Hemoglobin: 10 g/dL — ABNORMAL LOW (ref 13.0–17.0)
Immature Granulocytes: 0 %
Lymphocytes Relative: 4 %
Lymphs Abs: 0.4 10*3/uL — ABNORMAL LOW (ref 0.7–4.0)
MCH: 35.1 pg — ABNORMAL HIGH (ref 26.0–34.0)
MCV: 92.3 fL (ref 80.0–100.0)
Monocytes Absolute: 0.8 10*3/uL (ref 0.1–1.0)
Monocytes Relative: 8 %
Neutro Abs: 7.2 10*3/uL (ref 1.7–7.7)
Neutrophils Relative %: 84 %
Platelet Count: 243 10*3/uL (ref 150–400)
WBC Count: 8.5 10*3/uL (ref 4.0–10.5)
nRBC: 39 % — ABNORMAL HIGH (ref 0.0–0.2)

## 2019-11-15 LAB — CMP (CANCER CENTER ONLY)
ALT: 8 U/L (ref 0–44)
AST: 11 U/L — ABNORMAL LOW (ref 15–41)
Albumin: 4 g/dL (ref 3.5–5.0)
Alkaline Phosphatase: 105 U/L (ref 38–126)
Anion gap: 14 (ref 5–15)
BUN: 32 mg/dL — ABNORMAL HIGH (ref 6–20)
CO2: 32 mmol/L (ref 22–32)
Calcium: 9.5 mg/dL (ref 8.9–10.3)
Chloride: 95 mmol/L — ABNORMAL LOW (ref 98–111)
Creatinine: 8.6 mg/dL (ref 0.60–1.20)
GFR, Est AFR Am: 7 mL/min — ABNORMAL LOW (ref >60)
GFR, Estimated: 6 mL/min — ABNORMAL LOW (ref >60)
Glucose, Bld: 149 mg/dL — ABNORMAL HIGH (ref 70–99)
Potassium: 4.2 mmol/L (ref 3.5–5.1)
Sodium: 141 mmol/L (ref 135–145)
Total Bilirubin: 1.3 mg/dL — ABNORMAL HIGH (ref 0.3–1.2)
Total Protein: 6.8 g/dL (ref 6.5–8.1)

## 2019-11-15 MED ORDER — BORTEZOMIB CHEMO SQ INJECTION 3.5 MG (2.5MG/ML)
1.5000 mg/m2 | Freq: Once | INTRAMUSCULAR | Status: AC
  Administered 2019-11-15: 3 mg via SUBCUTANEOUS
  Filled 2019-11-15: qty 1.2

## 2019-11-15 MED ORDER — PROCHLORPERAZINE MALEATE 10 MG PO TABS
ORAL_TABLET | ORAL | Status: AC
  Filled 2019-11-15: qty 1

## 2019-11-15 MED ORDER — PROCHLORPERAZINE MALEATE 10 MG PO TABS
10.0000 mg | ORAL_TABLET | Freq: Once | ORAL | Status: AC
  Administered 2019-11-15: 10 mg via ORAL

## 2019-11-15 NOTE — Patient Instructions (Signed)
[facility] Discharge Instructions for Patients Receiving Chemotherapy  Today you received the following chemotherapy agents Velcade  To help prevent nausea and vomiting after your treatment, we encourage you to take your nausea medication as directed   If you develop nausea and vomiting that is not controlled by your nausea medication, call the clinic.   BELOW ARE SYMPTOMS THAT SHOULD BE REPORTED IMMEDIATELY:  *FEVER GREATER THAN 100.5 F  *CHILLS WITH OR WITHOUT FEVER  NAUSEA AND VOMITING THAT IS NOT CONTROLLED WITH YOUR NAUSEA MEDICATION  *UNUSUAL SHORTNESS OF BREATH  *UNUSUAL BRUISING OR BLEEDING  TENDERNESS IN MOUTH AND THROAT WITH OR WITHOUT PRESENCE OF ULCERS  *URINARY PROBLEMS  *BOWEL PROBLEMS  UNUSUAL RASH Items with * indicate a potential emergency and should be followed up as soon as possible.  Feel free to call the clinic should you have any questions or concerns. The clinic phone number is [phone].  Please show the [facility] at check-in to the Emergency Department and triage nurse.

## 2019-11-15 NOTE — Telephone Encounter (Signed)
[employer] Forms faxed to [phone]. Confirmation received

## 2019-11-15 NOTE — Telephone Encounter (Signed)
Received a call from the lab reporting patient's creatinine is 8.6. Dr. Alen Blew made aware. No further orders received. It is already on patient's treatment plan to treat regardless of creatinine.

## 2019-11-16 ENCOUNTER — Telehealth: Payer: Self-pay

## 2019-11-16 ENCOUNTER — Ambulatory Visit: Payer: Self-pay

## 2019-11-16 DIAGNOSIS — N186 End stage renal disease: Secondary | ICD-10-CM

## 2019-11-16 DIAGNOSIS — E1129 Type 2 diabetes mellitus with other diabetic kidney complication: Secondary | ICD-10-CM | POA: Diagnosis not present

## 2019-11-16 DIAGNOSIS — D631 Anemia in chronic kidney disease: Secondary | ICD-10-CM | POA: Diagnosis not present

## 2019-11-16 DIAGNOSIS — Z23 Encounter for immunization: Secondary | ICD-10-CM | POA: Diagnosis not present

## 2019-11-16 DIAGNOSIS — N2581 Secondary hyperparathyroidism of renal origin: Secondary | ICD-10-CM | POA: Diagnosis not present

## 2019-11-16 DIAGNOSIS — C9000 Multiple myeloma not having achieved remission: Secondary | ICD-10-CM

## 2019-11-16 DIAGNOSIS — E1122 Type 2 diabetes mellitus with diabetic chronic kidney disease: Secondary | ICD-10-CM

## 2019-11-16 DIAGNOSIS — Z992 Dependence on renal dialysis: Secondary | ICD-10-CM | POA: Diagnosis not present

## 2019-11-16 DIAGNOSIS — D509 Iron deficiency anemia, unspecified: Secondary | ICD-10-CM | POA: Diagnosis not present

## 2019-11-16 LAB — KAPPA/LAMBDA LIGHT CHAINS
Kappa free light chain: 119.1 mg/L — ABNORMAL HIGH (ref 3.3–19.4)
Kappa, lambda light chain ratio: 2.73 — ABNORMAL HIGH (ref 0.26–1.65)
Lambda free light chains: 43.7 mg/L — ABNORMAL HIGH (ref 5.7–26.3)

## 2019-11-16 LAB — CBC AND DIFFERENTIAL
HCT: 28 — AB (ref 41–53)
Hemoglobin: 10 — AB (ref 13.5–17.5)
Neutrophils Absolute: 18
WBC: 11.3

## 2019-11-16 LAB — BASIC METABOLIC PANEL
BUN: 60 — AB (ref 4–21)
Creatinine: 11.1 — AB (ref 0.6–1.3)

## 2019-11-16 LAB — CBC: RBC: 2.76 — AB (ref 3.87–5.11)

## 2019-11-16 NOTE — Telephone Encounter (Signed)
Isaiah [name] was notified that the paperwork the pt's CAP services has been completed and faxed.  A copy has been kept for the pt's chart and the original has been placed in the mail to the pt.

## 2019-11-16 NOTE — Chronic Care Management (AMB) (Signed)
Chronic Care Management    Social Work Follow Up Note  11/16/2019 Name: Isaiah [name] MRN: [id number] DOB: 1965-12-04  Isaiah [name] is a 54 y.o. year old male who is a primary care patient of Isaiah Chard, MD. The CCM team was consulted for assistance with care coordination.   Review of patient status, including review of consultants reports, other relevant assessments, and collaboration with appropriate care team members and the patient's provider was performed as part of comprehensive patient evaluation and provision of chronic care management [name].    SDOH (Social Determinants of Health) assessments performed: No    Outpatient Encounter Medications as of 11/16/2019  Medication Sig Note  . acetaminophen (TYLENOL) 325 MG tablet Take 1-2 tablets (325-650 mg total) by mouth every 4 (four) hours as needed for mild pain.   [name] acyclovir (ZOVIRAX) 400 MG tablet Take 400 mg by mouth at bedtime.   [name] albuterol (PROAIR HFA) 108 (90 Base) MCG/ACT inhaler Inhale two puffs every 4-6 hours if needed for cough or wheeze   . Alcohol Swabs (SM ALCOHOL PREP) 70 % PADS    . calcitRIOL (ROCALTROL) 0.25 MCG capsule Take by mouth 3 (three) times a week.   . calcium carbonate (TUMS - DOSED IN MG ELEMENTAL CALCIUM) 500 MG chewable tablet Chew 2 tablets by mouth at bedtime. 10/04/2019: On hold per MD  . camphor-menthol (SARNA) lotion Apply 1 application topically every 8 (eight) hours as needed for itching.   . Cetirizine HCl 10 MG CAPS Take 1 capsule by mouth daily.   . cinacalcet (SENSIPAR) 60 MG tablet Take 60 mg by mouth every evening. ON HOLD FOR DIALYSIS PER WIFE   . clonazePAM (KLONOPIN) 0.5 MG tablet Take 2 tablets every Monday, Wednesday, and Friday with hemodialysis   . DARZALEX FASPRO 1800-30000 MG-UT/15ML SOLN Inject 1   . dexamethasone (DECADRON) 4 MG tablet Take 5 tablets (20 mg total) by mouth once a week. Take 5 tablets (20 mg total) by mouth once a week on day of chemo.   . fluticasone  furoate-vilanterol (BREO ELLIPTA) 200-25 MCG/INH AEPB Inhale 1 puff into the lungs daily.   [name] FREESTYLE LITE test strip    . hydrOXYzine (ATARAX/VISTARIL) 25 MG tablet Take 1 tablet (25 mg total) by mouth every 8 (eight) hours as needed for itching.   . Insulin Lispro (HUMALOG KWIKPEN ) Inject 2-4 Units into the skin 3 (three) times daily before meals. Sliding scale: 150-250 =2 units, 250 -350=3 units, 350 += 4 units   . ketorolac (ACULAR) 0.5 % ophthalmic solution Place 1 drop into both eyes 4 (four) times daily.   [name] lacosamide (VIMPAT) 200 MG TABS tablet Take 1 tablet twice a day except on dialysis days M-W-F give 1 tab in AM, 1 tab after dialysis, 1 tab in PM.   . lamoTRIgine (LAMICTAL) 200 MG tablet Take 1 tablet (200 mg total) by mouth 2 (two) times daily.   . Lancets (FREESTYLE) lancets    . Methoxy PEG-Epoetin Beta (MIRCERA IJ) Mircera   . midodrine (PROAMATINE) 5 MG tablet Take one tablet by mouth 30 minutes before dialysis.   [name] multivitamin (RENA-VIT) TABS tablet Take 1 tablet by mouth at bedtime.   . Nutritional Supplements (FEEDING SUPPLEMENT, NEPRO CARB STEADY,) LIQD Take 237 mLs by mouth 3 (three) times daily as needed (Supplement).   . pantoprazole (PROTONIX) 40 MG tablet Take 40 mg by mouth daily.   . prochlorperazine (COMPAZINE) 10 MG tablet TAKE 1 TABLET (10 MG TOTAL)  BY MOUTH EVERY 6 (SIX) HOURS AS NEEDED FOR NAUSEA OR VOMITING.    No facility-administered encounter medications on file as of 11/16/2019.     Goals Addressed            This Visit's Progress   . COMPLETED: Assist with care management enrollment and perform SW screen to identify community resource needs       Current SDOH Barriers:  . Limited access to caregiver due to work schedule . Ongoing chronic conditions including multiple myeloma and DM II with ESRD o Chemotherapy every Tuesday o Dialysis M, W, F . Inability to perform ADL's and IADL's independently  Social Work Clinical Goal(s):  [name] Over the  next 30 days, patient will work with SW to address concerns related to care coordination needs  CCM SW Interventions: Completed 11/16/19 with patients spouse and caregiver Isaiah [name] . Inbound call received from Isaiah [name] who reports patient has been certified to access door to door [name] when needed  Patient Self Care Activities:  . Attends all scheduled provider appointments . Calls provider office for new concerns or questions . Supportive family member to assist with patient care needs  Please see past updates related to this goal by clicking on the "Past Updates" button in the selected goal      . Collaborate with RN Care Manager to perform apprpriate assessments to determine care management and care coordination needs       Current Barriers:  . Care management and care coordination needs related to DMII, ESRD, and multiple myeloma  which resulted in inpatient hospitalization . ADL IADL limitations  Clinical Social Work Clinical Goal(s):  [name] Over the next 60 days, patient will collaborate with care management team to assist with care coordination needs in response to recent inpatient hospitalization  CCM SW Interventions: Completed 11/16/2019 with spouse Isaiah [name] . Inbound call from patients spouse who reports provider medical portion of CAP/DA application was completed and submitted . Scheduled follow up over the next week to confirm application received and determine next steps needed to qualify for CAP/DA program  Patient Self Care Activities:  . Attends all scheduled provider appointments . Calls pharmacy for medication refills . Calls provider office for new concerns or questions . Supportive wife and caregiver to assist with patient care needs   Please see past updates related to this goal by clicking on the "Past Updates" button in the selected goal        Follow Up Plan: SW will follow up with patient by phone over the next two weeks   [name], BSW,  CDP Social Worker, Certified Dementia Practitioner [place] [facility] [place] [facility] [phone]  Total time spent performing care coordination and/or care management activities with the patient by phone or face to face = 5 minutes.

## 2019-11-16 NOTE — Patient Instructions (Signed)
Social Worker Visit Information  Goals we discussed today:  Goals Addressed            This Visit's Progress   . COMPLETED: Assist with care management enrollment and perform SW screen to identify community resource needs       Current SDOH Barriers:  . Limited access to caregiver due to work schedule . Ongoing chronic conditions including multiple myeloma and DM II with ESRD o Chemotherapy every Tuesday o Dialysis M, W, F . Inability to perform ADL's and IADL's independently  Social Work Clinical Goal(s):  [name] Over the next 30 days, patient will work with SW to address concerns related to care coordination needs  CCM SW Interventions: Completed 11/16/19 with patients spouse and caregiver [facility] . Inbound call received from [name] who reports patient has been certified to access door to door services when needed  Patient Self Care Activities:  . Attends all scheduled provider appointments . Calls provider office for new concerns or questions . Supportive family member to assist with patient care needs  Please see past updates related to this goal by clicking on the "Past Updates" button in the selected goal      . Collaborate with RN Care Manager to perform apprpriate assessments to determine care management and care coordination needs       Current Barriers:  . Care management and care coordination needs related to DMII, ESRD, and multiple myeloma  which resulted in inpatient hospitalization . ADL IADL limitations  Clinical Social Work Clinical Goal(s):  [name] Over the next 60 days, patient will collaborate with care management team to assist with care coordination needs in response to recent inpatient hospitalization  CCM SW Interventions: Completed 11/16/2019 with spouse [facility] . Inbound call from patients spouse who reports provider medical portion of CAP/DA application was completed and submitted . Scheduled follow up over the next week to confirm application  received and determine next steps needed to qualify for CAP/DA program  Patient Self Care Activities:  . Attends all scheduled provider appointments . Calls pharmacy for medication refills . Calls provider office for new concerns or questions . Supportive wife and caregiver to assist with patient care needs   Please see past updates related to this goal by clicking on the "Past Updates" button in the selected goal        Follow Up Plan: SW will follow up with patient by phone over the next two weeks   [name], BSW, CDP Social Worker, Certified Dementia Practitioner [place] [facility] [place] [facility] [phone]

## 2019-11-17 DIAGNOSIS — C9000 Multiple myeloma not having achieved remission: Secondary | ICD-10-CM | POA: Diagnosis not present

## 2019-11-17 DIAGNOSIS — I69351 Hemiplegia and hemiparesis following cerebral infarction affecting right dominant side: Secondary | ICD-10-CM | POA: Diagnosis not present

## 2019-11-17 DIAGNOSIS — E1122 Type 2 diabetes mellitus with diabetic chronic kidney disease: Secondary | ICD-10-CM | POA: Diagnosis not present

## 2019-11-17 DIAGNOSIS — N186 End stage renal disease: Secondary | ICD-10-CM | POA: Diagnosis not present

## 2019-11-17 DIAGNOSIS — I69320 Aphasia following cerebral infarction: Secondary | ICD-10-CM | POA: Diagnosis not present

## 2019-11-17 DIAGNOSIS — M2042 Other hammer toe(s) (acquired), left foot: Secondary | ICD-10-CM | POA: Diagnosis not present

## 2019-11-17 DIAGNOSIS — M2041 Other hammer toe(s) (acquired), right foot: Secondary | ICD-10-CM | POA: Diagnosis not present

## 2019-11-17 DIAGNOSIS — I120 Hypertensive chronic kidney disease with stage 5 chronic kidney disease or end stage renal disease: Secondary | ICD-10-CM | POA: Diagnosis not present

## 2019-11-18 ENCOUNTER — Telehealth: Payer: Self-pay

## 2019-11-18 DIAGNOSIS — E1129 Type 2 diabetes mellitus with other diabetic kidney complication: Secondary | ICD-10-CM | POA: Diagnosis not present

## 2019-11-18 DIAGNOSIS — D509 Iron deficiency anemia, unspecified: Secondary | ICD-10-CM | POA: Diagnosis not present

## 2019-11-18 DIAGNOSIS — N2581 Secondary hyperparathyroidism of renal origin: Secondary | ICD-10-CM | POA: Diagnosis not present

## 2019-11-18 DIAGNOSIS — N186 End stage renal disease: Secondary | ICD-10-CM | POA: Diagnosis not present

## 2019-11-18 DIAGNOSIS — D631 Anemia in chronic kidney disease: Secondary | ICD-10-CM | POA: Diagnosis not present

## 2019-11-18 DIAGNOSIS — Z992 Dependence on renal dialysis: Secondary | ICD-10-CM | POA: Diagnosis not present

## 2019-11-18 DIAGNOSIS — Z23 Encounter for immunization: Secondary | ICD-10-CM | POA: Diagnosis not present

## 2019-11-18 LAB — MULTIPLE MYELOMA PANEL, SERUM
Albumin SerPl Elph-Mcnc: 4.1 g/dL (ref 2.9–4.4)
Albumin/Glob SerPl: 1.8 — ABNORMAL HIGH (ref 0.7–1.7)
Alpha 1: 0.3 g/dL (ref 0.0–0.4)
Alpha2 Glob SerPl Elph-Mcnc: 0.6 g/dL (ref 0.4–1.0)
B-Globulin SerPl Elph-Mcnc: 0.8 g/dL (ref 0.7–1.3)
Gamma Glob SerPl Elph-Mcnc: 0.6 g/dL (ref 0.4–1.8)
Globulin, Total: 2.3 g/dL (ref 2.2–3.9)
IgA: 22 mg/dL — ABNORMAL LOW (ref 90–386)
IgG (Immunoglobin G), Serum: 716 mg/dL (ref 603–1613)
IgM (Immunoglobulin M), Srm: 18 mg/dL — ABNORMAL LOW (ref 20–172)
M Protein SerPl Elph-Mcnc: 0.2 g/dL — ABNORMAL HIGH
Total Protein ELP: 6.4 g/dL (ref 6.0–8.5)

## 2019-11-21 ENCOUNTER — Ambulatory Visit: Payer: Self-pay

## 2019-11-21 DIAGNOSIS — C9000 Multiple myeloma not having achieved remission: Secondary | ICD-10-CM

## 2019-11-21 DIAGNOSIS — N2581 Secondary hyperparathyroidism of renal origin: Secondary | ICD-10-CM | POA: Diagnosis not present

## 2019-11-21 DIAGNOSIS — Z992 Dependence on renal dialysis: Secondary | ICD-10-CM | POA: Diagnosis not present

## 2019-11-21 DIAGNOSIS — N186 End stage renal disease: Secondary | ICD-10-CM | POA: Diagnosis not present

## 2019-11-21 DIAGNOSIS — D509 Iron deficiency anemia, unspecified: Secondary | ICD-10-CM | POA: Diagnosis not present

## 2019-11-21 DIAGNOSIS — D631 Anemia in chronic kidney disease: Secondary | ICD-10-CM | POA: Diagnosis not present

## 2019-11-21 DIAGNOSIS — Z23 Encounter for immunization: Secondary | ICD-10-CM | POA: Diagnosis not present

## 2019-11-21 DIAGNOSIS — E1129 Type 2 diabetes mellitus with other diabetic kidney complication: Secondary | ICD-10-CM | POA: Diagnosis not present

## 2019-11-21 DIAGNOSIS — E1122 Type 2 diabetes mellitus with diabetic chronic kidney disease: Secondary | ICD-10-CM

## 2019-11-21 MED FILL — VIMPAT 200 MG TABLET: 200 | 30 days supply | Qty: 90 | Fill #3

## 2019-11-21 NOTE — Patient Instructions (Signed)
Social Worker Visit Information  Goals we discussed today:  Goals Addressed            This Visit's Progress   . Collaborate with RN Care Manager to perform apprpriate assessments to determine care management and care coordination needs       Current Barriers:  . Care management and care coordination needs related to DMII, ESRD, and multiple myeloma  which resulted in inpatient hospitalization . ADL IADL limitations  Clinical Social Work Clinical Goal(s):  [name] Over the next 60 days, patient will collaborate with care management team to assist with care coordination needs in response to recent inpatient hospitalization  CCM SW Interventions: Completed 11/21/19 . Communication sent to Alicia Amel, CAP/DA administrative assistant to confirm application received and review next steps o SW requesting return communication in order to inform the patient and his wife during next scheduled call  Completed 11/16/2019 with spouse [name] . Inbound call from patients spouse who reports provider medical portion of CAP/DA application was completed and submitted . Scheduled follow up over the next week to confirm application received and determine next steps needed to qualify for CAP/DA program  Patient Self Care Activities:  . Attends all scheduled provider appointments . Calls pharmacy for medication refills . Calls provider office for new concerns or questions . Supportive wife and caregiver to assist with patient care needs   Please see past updates related to this goal by clicking on the "Past Updates" button in the selected goal        Follow Up Plan: SW will follow up with patient by phone over the next month   [name], BSW, CDP Social Worker, Certified Dementia Practitioner [place] [facility] [place] [facility] [phone]

## 2019-11-21 NOTE — Chronic Care Management (AMB) (Signed)
Chronic Care Management    Social Work Follow Up Note  11/21/2019 Name: Isaiah [name] [facility] MRN: [id number] DOB: 1965/09/29  Isaiah [name] is a 54 y.o. year old male who is a primary care patient of [name], MD. The CCM team was consulted for assistance with care coordination.   Review of patient status, including review of consultants reports, other relevant assessments, and collaboration with appropriate care team members and the patient's provider was performed as part of comprehensive patient evaluation and provision of chronic care management services.    SDOH (Social Determinants of Health) assessments performed: No    Outpatient Encounter Medications as of 11/21/2019  Medication Sig Note  . acetaminophen (TYLENOL) 325 MG tablet Take 1-2 tablets (325-650 mg total) by mouth every 4 (four) hours as needed for mild pain.   [name] acyclovir (ZOVIRAX) 400 MG tablet Take 400 mg by mouth at bedtime.   [name] albuterol (PROAIR HFA) 108 (90 Base) MCG/ACT inhaler Inhale two puffs every 4-6 hours if needed for cough or wheeze   . Alcohol Swabs (SM ALCOHOL PREP) 70 % PADS    . calcitRIOL (ROCALTROL) 0.25 MCG capsule Take by mouth 3 (three) times a week.   . calcium carbonate (TUMS - DOSED IN MG ELEMENTAL CALCIUM) 500 MG chewable tablet Chew 2 tablets by mouth at bedtime. 10/04/2019: On hold per MD  . camphor-menthol (SARNA) lotion Apply 1 application topically every 8 (eight) hours as needed for itching.   . Cetirizine HCl 10 MG CAPS Take 1 capsule by mouth daily.   . cinacalcet (SENSIPAR) 60 MG tablet Take 60 mg by mouth every evening. ON HOLD FOR DIALYSIS PER WIFE   . clonazePAM (KLONOPIN) 0.5 MG tablet Take 2 tablets every Monday, Wednesday, and Friday with hemodialysis   . DARZALEX FASPRO 1800-30000 MG-UT/15ML SOLN Inject 1   . dexamethasone (DECADRON) 4 MG tablet Take 5 tablets (20 mg total) by mouth once a week. Take 5 tablets (20 mg total) by mouth once a week on day of chemo.   . fluticasone  furoate-vilanterol (BREO ELLIPTA) 200-25 MCG/INH AEPB Inhale 1 puff into the lungs daily.   [name] FREESTYLE LITE test strip    . hydrOXYzine (ATARAX/VISTARIL) 25 MG tablet Take 1 tablet (25 mg total) by mouth every 8 (eight) hours as needed for itching.   . Insulin Lispro (HUMALOG KWIKPEN [place]) Inject 2-4 Units into the skin 3 (three) times daily before meals. Sliding scale: 150-250 =2 units, 250 -350=3 units, 350 += 4 units   . ketorolac (ACULAR) 0.5 % ophthalmic solution Place 1 drop into both eyes 4 (four) times daily.   [name] lacosamide (VIMPAT) 200 MG TABS tablet Take 1 tablet twice a day except on dialysis days M-W-F give 1 tab in AM, 1 tab after dialysis, 1 tab in PM.   . lamoTRIgine (LAMICTAL) 200 MG tablet Take 1 tablet (200 mg total) by mouth 2 (two) times daily.   . Lancets (FREESTYLE) lancets    . Methoxy PEG-Epoetin Beta (MIRCERA IJ) Mircera   . midodrine (PROAMATINE) 5 MG tablet Take one tablet by mouth 30 minutes before dialysis.   [name] multivitamin (RENA-VIT) TABS tablet Take 1 tablet by mouth at bedtime.   . Nutritional Supplements (FEEDING SUPPLEMENT, NEPRO CARB STEADY,) LIQD Take 237 mLs by mouth 3 (three) times daily as needed (Supplement).   . pantoprazole (PROTONIX) 40 MG tablet Take 40 mg by mouth daily.   . prochlorperazine (COMPAZINE) 10 MG tablet TAKE 1 TABLET (10 MG TOTAL)  BY MOUTH EVERY 6 (SIX) HOURS AS NEEDED FOR NAUSEA OR VOMITING.    No facility-administered encounter medications on file as of 11/21/2019.     Goals Addressed            This Visit's Progress   . Collaborate with RN Care Manager to perform apprpriate assessments to determine care management and care coordination needs       Current Barriers:  . Care management and care coordination needs related to DMII, ESRD, and multiple myeloma  which resulted in inpatient hospitalization . ADL IADL limitations  Clinical Social Work Clinical Goal(s):  [name] Over the next 60 days, patient will collaborate with care  management team to assist with care coordination needs in response to recent inpatient hospitalization  CCM SW Interventions: Completed 11/21/19 . Communication sent to Alicia Amel, CAP/DA administrative assistant to confirm application received and review next steps o SW requesting return communication in order to inform the patient and his wife during next scheduled call  Completed 11/16/2019 with spouse [facility] . Inbound call from patients spouse who reports provider medical portion of CAP/DA application was completed and submitted . Scheduled follow up over the next week to confirm application received and determine next steps needed to qualify for CAP/DA program  Patient Self Care Activities:  . Attends all scheduled provider appointments . Calls pharmacy for medication refills . Calls provider office for new concerns or questions . Supportive wife and caregiver to assist with patient care needs   Please see past updates related to this goal by clicking on the "Past Updates" button in the selected goal        Follow Up Plan: SW will follow up with patient by phone over the next month   [name], BSW, CDP Social Worker, Certified Dementia Practitioner [place] [facility] [place] [facility] [phone]  Total time spent performing care coordination and/or care management activities with the patient by phone or face to face = 5 minutes.

## 2019-11-22 ENCOUNTER — Telehealth: Payer: Self-pay

## 2019-11-22 ENCOUNTER — Inpatient Hospital Stay: Payer: Medicare Other | Admitting: Oncology

## 2019-11-22 ENCOUNTER — Inpatient Hospital Stay: Payer: Medicare Other

## 2019-11-22 ENCOUNTER — Other Ambulatory Visit: Payer: Self-pay

## 2019-11-22 VITALS — BP 132/41 | HR 78 | Temp 98.3°F | Resp 16 | Ht 70.0 in | Wt 184.8 lb

## 2019-11-22 DIAGNOSIS — C9000 Multiple myeloma not having achieved remission: Secondary | ICD-10-CM

## 2019-11-22 DIAGNOSIS — E1122 Type 2 diabetes mellitus with diabetic chronic kidney disease: Secondary | ICD-10-CM | POA: Diagnosis not present

## 2019-11-22 DIAGNOSIS — I120 Hypertensive chronic kidney disease with stage 5 chronic kidney disease or end stage renal disease: Secondary | ICD-10-CM | POA: Diagnosis not present

## 2019-11-22 DIAGNOSIS — N186 End stage renal disease: Secondary | ICD-10-CM | POA: Diagnosis not present

## 2019-11-22 DIAGNOSIS — I69320 Aphasia following cerebral infarction: Secondary | ICD-10-CM | POA: Diagnosis not present

## 2019-11-22 DIAGNOSIS — Z5112 Encounter for antineoplastic immunotherapy: Secondary | ICD-10-CM | POA: Diagnosis not present

## 2019-11-22 DIAGNOSIS — I871 Compression of vein: Secondary | ICD-10-CM | POA: Diagnosis not present

## 2019-11-22 DIAGNOSIS — T82858A Stenosis of vascular prosthetic devices, implants and grafts, initial encounter: Secondary | ICD-10-CM | POA: Diagnosis not present

## 2019-11-22 DIAGNOSIS — I69351 Hemiplegia and hemiparesis following cerebral infarction affecting right dominant side: Secondary | ICD-10-CM | POA: Diagnosis not present

## 2019-11-22 DIAGNOSIS — Z992 Dependence on renal dialysis: Secondary | ICD-10-CM | POA: Diagnosis not present

## 2019-11-22 LAB — CMP (CANCER CENTER ONLY)
ALT: 10 U/L (ref 0–44)
AST: 12 U/L — ABNORMAL LOW (ref 15–41)
Albumin: 4.2 g/dL (ref 3.5–5.0)
Alkaline Phosphatase: 106 U/L (ref 38–126)
Anion gap: 17 — ABNORMAL HIGH (ref 5–15)
BUN: 33 mg/dL — ABNORMAL HIGH (ref 6–20)
CO2: 28 mmol/L (ref 22–32)
Calcium: 9.5 mg/dL (ref 8.9–10.3)
Chloride: 95 mmol/L — ABNORMAL LOW (ref 98–111)
Creatinine: 8.3 mg/dL (ref 0.61–1.24)
GFR, Est AFR Am: 8 mL/min — ABNORMAL LOW (ref >60)
GFR, Estimated: 7 mL/min — ABNORMAL LOW (ref >60)
Glucose, Bld: 195 mg/dL — ABNORMAL HIGH (ref 70–99)
Potassium: 4.2 mmol/L (ref 3.5–5.1)
Sodium: 140 mmol/L (ref 135–145)
Total Bilirubin: 1.2 mg/dL (ref 0.3–1.2)
Total Protein: 6.8 g/dL (ref 6.5–8.1)

## 2019-11-22 LAB — CBC WITH DIFFERENTIAL (CANCER CENTER ONLY)
Abs Immature Granulocytes: 0.02 10*3/uL (ref 0.00–0.07)
Basophils Absolute: 0.1 10*3/uL (ref 0.0–0.1)
Basophils Relative: 1 %
Eosinophils Absolute: 0.4 10*3/uL (ref 0.0–0.5)
Eosinophils Relative: 6 %
HCT: 27.7 % — ABNORMAL LOW (ref 39.0–52.0)
Hemoglobin: 10.3 g/dL — ABNORMAL LOW (ref 13.0–17.0)
Immature Granulocytes: 0 %
Lymphocytes Relative: 9 %
Lymphs Abs: 0.6 10*3/uL — ABNORMAL LOW (ref 0.7–4.0)
MCH: 35.4 pg — ABNORMAL HIGH (ref 26.0–34.0)
MCHC: 37.2 g/dL — ABNORMAL HIGH (ref 30.0–36.0)
MCV: 95.2 fL (ref 80.0–100.0)
Monocytes Absolute: 0.9 10*3/uL (ref 0.1–1.0)
Monocytes Relative: 13 %
Neutro Abs: 5 10*3/uL (ref 1.7–7.7)
Neutrophils Relative %: 71 %
Platelet Count: 266 10*3/uL (ref 150–400)
RBC: 2.91 MIL/uL — ABNORMAL LOW (ref 4.22–5.81)
RDW: 14.3 % (ref 11.5–15.5)
WBC Count: 7.1 10*3/uL (ref 4.0–10.5)
nRBC: 24.9 % — ABNORMAL HIGH (ref 0.0–0.2)

## 2019-11-22 MED ORDER — PROCHLORPERAZINE MALEATE 10 MG PO TABS
10.0000 mg | ORAL_TABLET | Freq: Once | ORAL | Status: AC
  Administered 2019-11-22: 10 mg via ORAL

## 2019-11-22 MED ORDER — PROCHLORPERAZINE MALEATE 10 MG PO TABS
ORAL_TABLET | ORAL | Status: AC
  Filled 2019-11-22: qty 1

## 2019-11-22 MED ORDER — BORTEZOMIB CHEMO SQ INJECTION 3.5 MG (2.5MG/ML)
1.5000 mg/m2 | Freq: Once | INTRAMUSCULAR | Status: AC
  Administered 2019-11-22: 3 mg via SUBCUTANEOUS
  Filled 2019-11-22: qty 1.2

## 2019-11-22 NOTE — Patient Instructions (Signed)
[facility] Discharge Instructions for Patients Receiving Chemotherapy  Today you received the following chemotherapy agents:  bortezomib (Velcade)  To help prevent nausea and vomiting after your treatment, we encourage you to take your nausea medication as prescribed.   If you develop nausea and vomiting that is not controlled by your nausea medication, call the clinic.   BELOW ARE SYMPTOMS THAT SHOULD BE REPORTED IMMEDIATELY:  *FEVER GREATER THAN 100.5 F  *CHILLS WITH OR WITHOUT FEVER  NAUSEA AND VOMITING THAT IS NOT CONTROLLED WITH YOUR NAUSEA MEDICATION  *UNUSUAL SHORTNESS OF BREATH  *UNUSUAL BRUISING OR BLEEDING  TENDERNESS IN MOUTH AND THROAT WITH OR WITHOUT PRESENCE OF ULCERS  *URINARY PROBLEMS  *BOWEL PROBLEMS  UNUSUAL RASH Items with * indicate a potential emergency and should be followed up as soon as possible.  Feel free to call the clinic should you have any questions or concerns. The clinic phone number is [phone].  Please show the [place] at check-in to the Emergency Department and triage nurse.

## 2019-11-22 NOTE — Telephone Encounter (Signed)
Lab called with Scr 8.3. Dr. Alen Blew made aware, no new orders at this time.

## 2019-11-23 ENCOUNTER — Other Ambulatory Visit: Payer: Self-pay | Admitting: Oncology

## 2019-11-23 ENCOUNTER — Other Ambulatory Visit: Payer: Self-pay

## 2019-11-23 ENCOUNTER — Other Ambulatory Visit: Payer: Self-pay | Admitting: Internal Medicine

## 2019-11-23 ENCOUNTER — Encounter: Payer: Self-pay | Admitting: Internal Medicine

## 2019-11-23 DIAGNOSIS — Z23 Encounter for immunization: Secondary | ICD-10-CM | POA: Diagnosis not present

## 2019-11-23 DIAGNOSIS — D509 Iron deficiency anemia, unspecified: Secondary | ICD-10-CM | POA: Diagnosis not present

## 2019-11-23 DIAGNOSIS — Z992 Dependence on renal dialysis: Secondary | ICD-10-CM | POA: Diagnosis not present

## 2019-11-23 DIAGNOSIS — D631 Anemia in chronic kidney disease: Secondary | ICD-10-CM | POA: Diagnosis not present

## 2019-11-23 DIAGNOSIS — E1129 Type 2 diabetes mellitus with other diabetic kidney complication: Secondary | ICD-10-CM | POA: Diagnosis not present

## 2019-11-23 DIAGNOSIS — N186 End stage renal disease: Secondary | ICD-10-CM | POA: Diagnosis not present

## 2019-11-23 DIAGNOSIS — N2581 Secondary hyperparathyroidism of renal origin: Secondary | ICD-10-CM | POA: Diagnosis not present

## 2019-11-23 MED FILL — PANTOPRAZOLE SOD DR 40 MG T: 40 | 90 days supply | Qty: 90 | Fill #0

## 2019-11-23 MED FILL — ACYCLOVIR 400 MG TABLET: 400 | 90 days supply | Qty: 90 | Fill #0

## 2019-11-24 DIAGNOSIS — I69351 Hemiplegia and hemiparesis following cerebral infarction affecting right dominant side: Secondary | ICD-10-CM | POA: Diagnosis not present

## 2019-11-24 DIAGNOSIS — N186 End stage renal disease: Secondary | ICD-10-CM | POA: Diagnosis not present

## 2019-11-24 DIAGNOSIS — C9000 Multiple myeloma not having achieved remission: Secondary | ICD-10-CM | POA: Diagnosis not present

## 2019-11-24 DIAGNOSIS — I69320 Aphasia following cerebral infarction: Secondary | ICD-10-CM | POA: Diagnosis not present

## 2019-11-24 DIAGNOSIS — E1122 Type 2 diabetes mellitus with diabetic chronic kidney disease: Secondary | ICD-10-CM | POA: Diagnosis not present

## 2019-11-24 DIAGNOSIS — I120 Hypertensive chronic kidney disease with stage 5 chronic kidney disease or end stage renal disease: Secondary | ICD-10-CM | POA: Diagnosis not present

## 2019-11-25 ENCOUNTER — Ambulatory Visit: Payer: Self-pay

## 2019-11-25 ENCOUNTER — Telehealth: Payer: Self-pay

## 2019-11-25 DIAGNOSIS — N2581 Secondary hyperparathyroidism of renal origin: Secondary | ICD-10-CM | POA: Diagnosis not present

## 2019-11-25 DIAGNOSIS — D509 Iron deficiency anemia, unspecified: Secondary | ICD-10-CM | POA: Diagnosis not present

## 2019-11-25 DIAGNOSIS — C9000 Multiple myeloma not having achieved remission: Secondary | ICD-10-CM

## 2019-11-25 DIAGNOSIS — N186 End stage renal disease: Secondary | ICD-10-CM

## 2019-11-25 DIAGNOSIS — E1129 Type 2 diabetes mellitus with other diabetic kidney complication: Secondary | ICD-10-CM | POA: Diagnosis not present

## 2019-11-25 DIAGNOSIS — Z23 Encounter for immunization: Secondary | ICD-10-CM | POA: Diagnosis not present

## 2019-11-25 DIAGNOSIS — E1122 Type 2 diabetes mellitus with diabetic chronic kidney disease: Secondary | ICD-10-CM

## 2019-11-25 DIAGNOSIS — Z992 Dependence on renal dialysis: Secondary | ICD-10-CM | POA: Diagnosis not present

## 2019-11-25 DIAGNOSIS — D631 Anemia in chronic kidney disease: Secondary | ICD-10-CM | POA: Diagnosis not present

## 2019-11-25 MED FILL — SUBVENITE 200 MG TABS: 200 | 30 days supply | Qty: 60 | Fill #1

## 2019-11-25 NOTE — Chronic Care Management (AMB) (Signed)
  Chronic Care Management   Outreach Note  11/25/2019 Name: Isaiah [name] [facility] MRN: [id number] DOB: [birth year]  Referred by: [name], MD Reason for referral : Care Coordination   SW placed an unsuccessful outbound call to the patients spouse, [facility], to follow up on goal progression and assist with care coordination needs. SW left a HIPAA compliant voice message requesting a return call.  Follow Up Plan: The care management team will reach out to the patient again over the next 10 days.   [name], BSW, CDP Social Worker, Certified Dementia Practitioner [place] / [place] [facility] [phone]

## 2019-11-28 DIAGNOSIS — Z23 Encounter for immunization: Secondary | ICD-10-CM | POA: Diagnosis not present

## 2019-11-28 DIAGNOSIS — E1129 Type 2 diabetes mellitus with other diabetic kidney complication: Secondary | ICD-10-CM | POA: Diagnosis not present

## 2019-11-28 DIAGNOSIS — D509 Iron deficiency anemia, unspecified: Secondary | ICD-10-CM | POA: Diagnosis not present

## 2019-11-28 DIAGNOSIS — N2581 Secondary hyperparathyroidism of renal origin: Secondary | ICD-10-CM | POA: Diagnosis not present

## 2019-11-28 DIAGNOSIS — Z992 Dependence on renal dialysis: Secondary | ICD-10-CM | POA: Diagnosis not present

## 2019-11-28 DIAGNOSIS — N186 End stage renal disease: Secondary | ICD-10-CM | POA: Diagnosis not present

## 2019-11-28 DIAGNOSIS — D631 Anemia in chronic kidney disease: Secondary | ICD-10-CM | POA: Diagnosis not present

## 2019-11-29 ENCOUNTER — Ambulatory Visit: Payer: Self-pay

## 2019-11-29 ENCOUNTER — Inpatient Hospital Stay: Payer: Medicare Other

## 2019-11-29 ENCOUNTER — Other Ambulatory Visit: Payer: Self-pay

## 2019-11-29 VITALS — BP 142/69 | HR 96 | Temp 97.8°F | Resp 17

## 2019-11-29 DIAGNOSIS — C9000 Multiple myeloma not having achieved remission: Secondary | ICD-10-CM

## 2019-11-29 DIAGNOSIS — I69351 Hemiplegia and hemiparesis following cerebral infarction affecting right dominant side: Secondary | ICD-10-CM | POA: Diagnosis not present

## 2019-11-29 DIAGNOSIS — E1122 Type 2 diabetes mellitus with diabetic chronic kidney disease: Secondary | ICD-10-CM

## 2019-11-29 DIAGNOSIS — N186 End stage renal disease: Secondary | ICD-10-CM | POA: Diagnosis not present

## 2019-11-29 DIAGNOSIS — I120 Hypertensive chronic kidney disease with stage 5 chronic kidney disease or end stage renal disease: Secondary | ICD-10-CM | POA: Diagnosis not present

## 2019-11-29 DIAGNOSIS — I69320 Aphasia following cerebral infarction: Secondary | ICD-10-CM | POA: Diagnosis not present

## 2019-11-29 DIAGNOSIS — Z5112 Encounter for antineoplastic immunotherapy: Secondary | ICD-10-CM | POA: Diagnosis not present

## 2019-11-29 LAB — CMP (CANCER CENTER ONLY)
ALT: 9 U/L (ref 0–44)
AST: 12 U/L — ABNORMAL LOW (ref 15–41)
Albumin: 4.2 g/dL (ref 3.5–5.0)
Alkaline Phosphatase: 119 U/L (ref 38–126)
Anion gap: 17 — ABNORMAL HIGH (ref 5–15)
BUN: 30 mg/dL — ABNORMAL HIGH (ref 6–20)
CO2: 31 mmol/L (ref 22–32)
Calcium: 10 mg/dL (ref 8.9–10.3)
Chloride: 94 mmol/L — ABNORMAL LOW (ref 98–111)
Creatinine: 7.85 mg/dL (ref 0.61–1.24)
GFR, Est AFR Am: 8 mL/min — ABNORMAL LOW (ref >60)
GFR, Estimated: 7 mL/min — ABNORMAL LOW (ref >60)
Glucose, Bld: 167 mg/dL — ABNORMAL HIGH (ref 70–99)
Potassium: 4.7 mmol/L (ref 3.5–5.1)
Sodium: 142 mmol/L (ref 135–145)
Total Bilirubin: 1.1 mg/dL (ref 0.3–1.2)
Total Protein: 7.1 g/dL (ref 6.5–8.1)

## 2019-11-29 LAB — CBC WITH DIFFERENTIAL (CANCER CENTER ONLY)
Abs Immature Granulocytes: 0.03 10*3/uL (ref 0.00–0.07)
Basophils Absolute: 0.1 10*3/uL (ref 0.0–0.1)
Basophils Relative: 1 %
Eosinophils Absolute: 0.2 10*3/uL (ref 0.0–0.5)
Eosinophils Relative: 2 %
HCT: 29.4 % — ABNORMAL LOW (ref 39.0–52.0)
Hemoglobin: 10.8 g/dL — ABNORMAL LOW (ref 13.0–17.0)
Immature Granulocytes: 0 %
Lymphocytes Relative: 3 %
Lymphs Abs: 0.3 10*3/uL — ABNORMAL LOW (ref 0.7–4.0)
MCH: 35.1 pg — ABNORMAL HIGH (ref 26.0–34.0)
MCHC: 36.7 g/dL — ABNORMAL HIGH (ref 30.0–36.0)
MCV: 95.5 fL (ref 80.0–100.0)
Monocytes Absolute: 0.2 10*3/uL (ref 0.1–1.0)
Monocytes Relative: 2 %
Neutro Abs: 9.7 10*3/uL — ABNORMAL HIGH (ref 1.7–7.7)
Neutrophils Relative %: 92 %
Platelet Count: 333 10*3/uL (ref 150–400)
RBC: 3.08 MIL/uL — ABNORMAL LOW (ref 4.22–5.81)
RDW: 14.6 % (ref 11.5–15.5)
WBC Count: 10.5 10*3/uL (ref 4.0–10.5)
nRBC: 18.2 % — ABNORMAL HIGH (ref 0.0–0.2)

## 2019-11-29 MED ORDER — ACETAMINOPHEN 325 MG PO TABS
650.0000 mg | ORAL_TABLET | Freq: Once | ORAL | Status: AC
  Administered 2019-11-29: 650 mg via ORAL

## 2019-11-29 MED ORDER — DIPHENHYDRAMINE HCL 25 MG PO CAPS
ORAL_CAPSULE | ORAL | Status: AC
  Filled 2019-11-29: qty 2

## 2019-11-29 MED ORDER — PROCHLORPERAZINE MALEATE 10 MG PO TABS
ORAL_TABLET | ORAL | Status: AC
  Filled 2019-11-29: qty 1

## 2019-11-29 MED ORDER — ACETAMINOPHEN 325 MG PO TABS
ORAL_TABLET | ORAL | Status: AC
  Filled 2019-11-29: qty 2

## 2019-11-29 MED ORDER — DIPHENHYDRAMINE HCL 25 MG PO CAPS
50.0000 mg | ORAL_CAPSULE | Freq: Once | ORAL | Status: AC
  Administered 2019-11-29: 50 mg via ORAL

## 2019-11-29 MED ORDER — DARATUMUMAB-HYALURONIDASE-FIHJ 1800-30000 MG-UT/15ML ~~LOC~~ SOLN
1800.0000 mg | Freq: Once | SUBCUTANEOUS | Status: AC
  Administered 2019-11-29: 1800 mg via SUBCUTANEOUS
  Filled 2019-11-29: qty 15

## 2019-11-29 MED ORDER — PROCHLORPERAZINE MALEATE 10 MG PO TABS
10.0000 mg | ORAL_TABLET | Freq: Once | ORAL | Status: AC
  Administered 2019-11-29: 10 mg via ORAL

## 2019-11-29 MED ORDER — BORTEZOMIB CHEMO SQ INJECTION 3.5 MG (2.5MG/ML)
1.5000 mg/m2 | Freq: Once | INTRAMUSCULAR | Status: AC
  Administered 2019-11-29: 3 mg via SUBCUTANEOUS
  Filled 2019-11-29: qty 1.2

## 2019-11-29 NOTE — Progress Notes (Signed)
Critical value creatinine reported at 7.85 to Wendall Mola, RN who supports Dr. Alen Blew. Gardiner Rhyme, RN

## 2019-11-29 NOTE — Chronic Care Management (AMB) (Signed)
Chronic Care Management    Social Work Follow Up Note  11/29/2019 Name: Isaiah [name] [facility] MRN: [id number] DOB: [birth year]  Isaiah [name] is a 54 y.o. year old male who is a primary care patient of [name], MD. The CCM team was consulted for assistance with care coordination.   Review of patient status, including review of consultants reports, other relevant assessments, and collaboration with appropriate care team members and the patient's provider was performed as part of comprehensive patient evaluation and provision of chronic care management services.    SDOH (Social Determinants of Health) assessments performed: No    Outpatient Encounter Medications as of 11/29/2019  Medication Sig Note  . acetaminophen (TYLENOL) 325 MG tablet Take 1-2 tablets (325-650 mg total) by mouth every 4 (four) hours as needed for mild pain.   [name] acyclovir (ZOVIRAX) 400 MG tablet TAKE 1 TABLET (400 MG TOTAL) BY MOUTH DAILY.   [name] albuterol (PROAIR HFA) 108 (90 Base) MCG/ACT inhaler Inhale two puffs every 4-6 hours if needed for cough or wheeze   . Alcohol Swabs (SM ALCOHOL PREP) 70 % PADS    . calcitRIOL (ROCALTROL) 0.25 MCG capsule Take by mouth 3 (three) times a week.   . calcium carbonate (TUMS - DOSED IN MG ELEMENTAL CALCIUM) 500 MG chewable tablet Chew 2 tablets by mouth at bedtime. 10/04/2019: On hold per MD  . camphor-menthol (SARNA) lotion Apply 1 application topically every 8 (eight) hours as needed for itching.   . Cetirizine HCl 10 MG CAPS Take 1 capsule by mouth daily.   . cinacalcet (SENSIPAR) 60 MG tablet Take 60 mg by mouth every evening. ON HOLD FOR DIALYSIS PER WIFE   . clonazePAM (KLONOPIN) 0.5 MG tablet Take 2 tablets every Monday, Wednesday, and Friday with hemodialysis   . DARZALEX FASPRO 1800-30000 MG-UT/15ML SOLN Inject 1   . dexamethasone (DECADRON) 4 MG tablet Take 5 tablets (20 mg total) by mouth once a week. Take 5 tablets (20 mg total) by mouth once a week on day of chemo.   .  fluticasone furoate-vilanterol (BREO ELLIPTA) 200-25 MCG/INH AEPB Inhale 1 puff into the lungs daily.   [name] FREESTYLE LITE test strip    . hydrOXYzine (ATARAX/VISTARIL) 25 MG tablet Take 1 tablet (25 mg total) by mouth every 8 (eight) hours as needed for itching.   . Insulin Lispro (HUMALOG KWIKPEN [place]) Inject 2-4 Units into the skin 3 (three) times daily before meals. Sliding scale: 150-250 =2 units, 250 -350=3 units, 350 += 4 units   . ketorolac (ACULAR) 0.5 % ophthalmic solution Place 1 drop into both eyes 4 (four) times daily.   [name] lacosamide (VIMPAT) 200 MG TABS tablet Take 1 tablet twice a day except on dialysis days M-W-F give 1 tab in AM, 1 tab after dialysis, 1 tab in PM.   . lamoTRIgine (LAMICTAL) 200 MG tablet Take 1 tablet (200 mg total) by mouth 2 (two) times daily.   . Lancets (FREESTYLE) lancets    . Methoxy PEG-Epoetin Beta (MIRCERA IJ) Mircera   . midodrine (PROAMATINE) 5 MG tablet Take one tablet by mouth 30 minutes before dialysis.   [name] multivitamin (RENA-VIT) TABS tablet Take 1 tablet by mouth at bedtime.   . Nutritional Supplements (FEEDING SUPPLEMENT, NEPRO CARB STEADY,) LIQD Take 237 mLs by mouth 3 (three) times daily as needed (Supplement).   . pantoprazole (PROTONIX) 40 MG tablet TAKE 1 TABLET BY MOUTH ONCE DAILY   . prochlorperazine (COMPAZINE) 10 MG tablet TAKE 1 TABLET (  10 MG TOTAL) BY MOUTH EVERY 6 (SIX) HOURS AS NEEDED FOR NAUSEA OR VOMITING.    No facility-administered encounter medications on file as of 11/29/2019.     Goals Addressed            This Visit's Progress   . Collaborate with RN Care Manager to perform apprpriate assessments to determine care management and care coordination needs   On track    Current Barriers:  . Care management and care coordination needs related to DMII, ESRD, and multiple myeloma  which resulted in inpatient hospitalization . ADL IADL limitations  Clinical Social Work Clinical Goal(s):  [name] Over the next 60 days, patient will  collaborate with care management team to assist with care coordination needs in response to recent inpatient hospitalization  CCM SW Interventions: Completed 11/29/19 . Inbound call from [facility] to report she has spoken with Alicia Amel and confirmed receipt of provider portion of CAP/DA application o Unfortunately, the patient portion was not also sent in. Mrs. Advani has emailed copy to Mrs. Karlene Lineman . Encouraged Mrs. Wnek to contact SW as needed . Scheduled follow up call over the next month to assess goal progression  Completed 11/16/2019 with spouse [facility] . Inbound call from patients spouse who reports provider medical portion of CAP/DA application was completed and submitted . Scheduled follow up over the next week to confirm application received and determine next steps needed to qualify for CAP/DA program  Patient Self Care Activities:  . Attends all scheduled provider appointments . Calls pharmacy for medication refills . Calls provider office for new concerns or questions . Supportive wife and caregiver to assist with patient care needs   Please see past updates related to this goal by clicking on the "Past Updates" button in the selected goal        Follow Up Plan: SW will follow up with patient by phone over the next month   [name], BSW, CDP Social Worker, Certified Dementia Practitioner [place] / [place] [facility] [phone]  Total time spent performing care coordination and/or care management activities with the patient by phone or face to face = 5 minutes.

## 2019-11-29 NOTE — Patient Instructions (Signed)
Social Worker Visit Information  Goals we discussed today:  Goals Addressed            This Visit's Progress   . Collaborate with RN Care Manager to perform apprpriate assessments to determine care management and care coordination needs   On track    Current Barriers:  . Care management and care coordination needs related to DMII, ESRD, and multiple myeloma  which resulted in inpatient hospitalization . ADL IADL limitations  Clinical Social Work Clinical Goal(s):  . Over the next 60 days, patient will collaborate with care management team to assist with care coordination needs in response to recent inpatient hospitalization  CCM SW Interventions: Completed 11/29/19 . Inbound call from Isaiah [name] to report she has spoken with Isaiah [name] and confirmed receipt of provider portion of CAP/DA application o Unfortunately, the patient portion was not also sent in. Isaiah [name] has emailed copy to Isaiah [name] . Encouraged Isaiah [name] to contact SW as needed . Scheduled follow up call over the next month to assess goal progression  Patient Self Care Activities:  . Attends all scheduled provider appointments . Calls pharmacy for medication refills . Calls provider office for new concerns or questions . Supportive wife and caregiver to assist with patient care needs   Please see past updates related to this goal by clicking on the "Past Updates" button in the selected goal        Follow Up Plan: SW will follow up with patient by phone over the next month.    , BSW, CDP Social Worker, Certified Dementia Practitioner [facility] Care Management [phone]         

## 2019-11-29 NOTE — Patient Instructions (Signed)
[facility] Discharge Instructions for Patients Receiving Chemotherapy  Today you received the following chemotherapy agents: Velcade, Darzalex Faspro  To help prevent nausea and vomiting after your treatment, we encourage you to take your nausea medication as directed.   If you develop nausea and vomiting that is not controlled by your nausea medication, call the clinic.   BELOW ARE SYMPTOMS THAT SHOULD BE REPORTED IMMEDIATELY:  *FEVER GREATER THAN 100.5 F  *CHILLS WITH OR WITHOUT FEVER  NAUSEA AND VOMITING THAT IS NOT CONTROLLED WITH YOUR NAUSEA MEDICATION  *UNUSUAL SHORTNESS OF BREATH  *UNUSUAL BRUISING OR BLEEDING  TENDERNESS IN MOUTH AND THROAT WITH OR WITHOUT PRESENCE OF ULCERS  *URINARY PROBLEMS  *BOWEL PROBLEMS  UNUSUAL RASH Items with * indicate a potential emergency and should be followed up as soon as possible.  Feel free to call the clinic should you have any questions or concerns. The clinic phone number is [phone].  Please show the [place] at check-in to the Emergency Department and triage nurse.

## 2019-11-30 DIAGNOSIS — Z23 Encounter for immunization: Secondary | ICD-10-CM | POA: Diagnosis not present

## 2019-11-30 DIAGNOSIS — Z992 Dependence on renal dialysis: Secondary | ICD-10-CM | POA: Diagnosis not present

## 2019-11-30 DIAGNOSIS — D509 Iron deficiency anemia, unspecified: Secondary | ICD-10-CM | POA: Diagnosis not present

## 2019-11-30 DIAGNOSIS — N2581 Secondary hyperparathyroidism of renal origin: Secondary | ICD-10-CM | POA: Diagnosis not present

## 2019-11-30 DIAGNOSIS — N186 End stage renal disease: Secondary | ICD-10-CM | POA: Diagnosis not present

## 2019-11-30 DIAGNOSIS — E1129 Type 2 diabetes mellitus with other diabetic kidney complication: Secondary | ICD-10-CM | POA: Diagnosis not present

## 2019-11-30 DIAGNOSIS — D631 Anemia in chronic kidney disease: Secondary | ICD-10-CM | POA: Diagnosis not present

## 2019-11-30 MED FILL — AURYXIA 210 MG TABLET: 1 GM | 30 days supply | Qty: 90 | Fill #0

## 2019-12-01 ENCOUNTER — Ambulatory Visit (INDEPENDENT_AMBULATORY_CARE_PROVIDER_SITE_OTHER): Payer: Medicare Other

## 2019-12-01 DIAGNOSIS — E1122 Type 2 diabetes mellitus with diabetic chronic kidney disease: Secondary | ICD-10-CM | POA: Diagnosis not present

## 2019-12-01 DIAGNOSIS — I69351 Hemiplegia and hemiparesis following cerebral infarction affecting right dominant side: Secondary | ICD-10-CM | POA: Diagnosis not present

## 2019-12-01 DIAGNOSIS — I120 Hypertensive chronic kidney disease with stage 5 chronic kidney disease or end stage renal disease: Secondary | ICD-10-CM | POA: Diagnosis not present

## 2019-12-01 DIAGNOSIS — C9000 Multiple myeloma not having achieved remission: Secondary | ICD-10-CM | POA: Diagnosis not present

## 2019-12-01 DIAGNOSIS — I69320 Aphasia following cerebral infarction: Secondary | ICD-10-CM | POA: Diagnosis not present

## 2019-12-01 DIAGNOSIS — N186 End stage renal disease: Secondary | ICD-10-CM

## 2019-12-01 NOTE — Patient Instructions (Signed)
Social Worker Visit Information  Goals we discussed today:  Goals Addressed            This Visit's Progress   . Collaborate with RN Care Manager to perform apprpriate assessments to determine care management and care coordination needs       Current Barriers:  . Care management and care coordination needs related to DMII, ESRD, and multiple myeloma  which resulted in inpatient hospitalization . ADL IADL limitations  Clinical Social Work Clinical Goal(s):  [name] Over the next 60 days, patient will collaborate with care management team to assist with care coordination needs in response to recent inpatient hospitalization  CCM SW Interventions: Completed 12/01/19 . Inbound call from [facility] to review Medicaid application process as next step to obtain CAP/DA assistance . Reviewed questions with Mrs. Dowda . Provided guidance on how to submit application via DSS portal for online submission . Encouraged Mrs. Mignone to contact SW with future care coordination needs . Scheduled follow up call over the next month  Patient Self Care Activities:  . Attends all scheduled provider appointments . Calls pharmacy for medication refills . Calls provider office for new concerns or questions . Supportive wife and caregiver to assist with patient care needs   Please see past updates related to this goal by clicking on the "Past Updates" button in the selected goal        Follow Up Plan: SW will follow up with patient by phone over the next month  [name], BSW, CDP Social Worker, Certified Dementia Practitioner [place] [facility] [place] Management [phone]

## 2019-12-01 NOTE — Chronic Care Management (AMB) (Signed)
Chronic Care Management    Social Work Follow Up Note  12/01/2019 Name: Isaiah Zagal [name] MRN: [id number] DOB: 11-01-1965  Isaiah [name] is a 54 y.o. year old male who is a primary care patient of [name], MD. The CCM team was consulted for assistance with care coordination.   Review of patient status, including review of consultants reports, other relevant assessments, and collaboration with appropriate care team members and the patient's provider was performed as part of comprehensive patient evaluation and provision of chronic care management services.    SDOH (Social Determinants of Health) assessments performed: No    Outpatient Encounter Medications as of 12/01/2019  Medication Sig Note  . acetaminophen (TYLENOL) 325 MG tablet Take 1-2 tablets (325-650 mg total) by mouth every 4 (four) hours as needed for mild pain.   [name] acyclovir (ZOVIRAX) 400 MG tablet TAKE 1 TABLET (400 MG TOTAL) BY MOUTH DAILY.   [name] albuterol (PROAIR HFA) 108 (90 Base) MCG/ACT inhaler Inhale two puffs every 4-6 hours if needed for cough or wheeze   . Alcohol Swabs (SM ALCOHOL PREP) 70 % PADS    . calcitRIOL (ROCALTROL) 0.25 MCG capsule Take by mouth 3 (three) times a week.   . calcium carbonate (TUMS - DOSED IN MG ELEMENTAL CALCIUM) 500 MG chewable tablet Chew 2 tablets by mouth at bedtime. 10/04/2019: On hold per MD  . camphor-menthol (SARNA) lotion Apply 1 application topically every 8 (eight) hours as needed for itching.   . Cetirizine HCl 10 MG CAPS Take 1 capsule by mouth daily.   . cinacalcet (SENSIPAR) 60 MG tablet Take 60 mg by mouth every evening. ON HOLD FOR DIALYSIS PER WIFE   . clonazePAM (KLONOPIN) 0.5 MG tablet Take 2 tablets every Monday, Wednesday, and Friday with hemodialysis   . DARZALEX FASPRO 1800-30000 MG-UT/15ML SOLN Inject 1   . dexamethasone (DECADRON) 4 MG tablet Take 5 tablets (20 mg total) by mouth once a week. Take 5 tablets (20 mg total) by mouth once a week on day of chemo.   .  fluticasone furoate-vilanterol (BREO ELLIPTA) 200-25 MCG/INH AEPB Inhale 1 puff into the lungs daily.   [name] FREESTYLE LITE test strip    . hydrOXYzine (ATARAX/VISTARIL) 25 MG tablet Take 1 tablet (25 mg total) by mouth every 8 (eight) hours as needed for itching.   . Insulin Lispro (HUMALOG KWIKPEN [place]) Inject 2-4 Units into the skin 3 (three) times daily before meals. Sliding scale: 150-250 =2 units, 250 -350=3 units, 350 += 4 units   . ketorolac (ACULAR) 0.5 % ophthalmic solution Place 1 drop into both eyes 4 (four) times daily.   [name] lacosamide (VIMPAT) 200 MG TABS tablet Take 1 tablet twice a day except on dialysis days M-W-F give 1 tab in AM, 1 tab after dialysis, 1 tab in PM.   . lamoTRIgine (LAMICTAL) 200 MG tablet Take 1 tablet (200 mg total) by mouth 2 (two) times daily.   . Lancets (FREESTYLE) lancets    . Methoxy PEG-Epoetin Beta (MIRCERA IJ) Mircera   . midodrine (PROAMATINE) 5 MG tablet Take one tablet by mouth 30 minutes before dialysis.   [name] multivitamin (RENA-VIT) TABS tablet Take 1 tablet by mouth at bedtime.   . Nutritional Supplements (FEEDING SUPPLEMENT, NEPRO CARB STEADY,) LIQD Take 237 mLs by mouth 3 (three) times daily as needed (Supplement).   . pantoprazole (PROTONIX) 40 MG tablet TAKE 1 TABLET BY MOUTH ONCE DAILY   . prochlorperazine (COMPAZINE) 10 MG tablet TAKE 1 TABLET (  10 MG TOTAL) BY MOUTH EVERY 6 (SIX) HOURS AS NEEDED FOR NAUSEA OR VOMITING.    No facility-administered encounter medications on file as of 12/01/2019.     Goals Addressed            This Visit's Progress   . Collaborate with RN Care Manager to perform apprpriate assessments to determine care management and care coordination needs       Current Barriers:  . Care management and care coordination needs related to DMII, ESRD, and multiple myeloma  which resulted in inpatient hospitalization . ADL IADL limitations  Clinical Social Work Clinical Goal(s):  [name] Over the next 60 days, patient will collaborate  with care management team to assist with care coordination needs in response to recent inpatient hospitalization  CCM SW Interventions: Completed 12/01/19 . Inbound call from [facility] to review Medicaid application process as next step to obtain CAP/DA assistance . Reviewed questions with Mrs. Alfred . Provided guidance on how to submit application via DSS portal for online submission . Encouraged Mrs. Kite to contact SW with future care coordination needs . Scheduled follow up call over the next month  Patient Self Care Activities:  . Attends all scheduled provider appointments . Calls pharmacy for medication refills . Calls provider office for new concerns or questions . Supportive wife and caregiver to assist with patient care needs   Please see past updates related to this goal by clicking on the "Past Updates" button in the selected goal        Follow Up Plan: SW will follow up with patient by phone over the next month   [name], BSW, CDP Social Worker, Certified Dementia Practitioner [place] [facility] [place] Management [phone]  Total time spent performing care coordination and/or care management activities with the patient by phone or face to face = 18 minutes.

## 2019-12-02 DIAGNOSIS — D509 Iron deficiency anemia, unspecified: Secondary | ICD-10-CM | POA: Diagnosis not present

## 2019-12-02 DIAGNOSIS — Z23 Encounter for immunization: Secondary | ICD-10-CM | POA: Diagnosis not present

## 2019-12-02 DIAGNOSIS — Z992 Dependence on renal dialysis: Secondary | ICD-10-CM | POA: Diagnosis not present

## 2019-12-02 DIAGNOSIS — E1129 Type 2 diabetes mellitus with other diabetic kidney complication: Secondary | ICD-10-CM | POA: Diagnosis not present

## 2019-12-02 DIAGNOSIS — N2581 Secondary hyperparathyroidism of renal origin: Secondary | ICD-10-CM | POA: Diagnosis not present

## 2019-12-02 DIAGNOSIS — D631 Anemia in chronic kidney disease: Secondary | ICD-10-CM | POA: Diagnosis not present

## 2019-12-02 DIAGNOSIS — N186 End stage renal disease: Secondary | ICD-10-CM | POA: Diagnosis not present

## 2019-12-04 DIAGNOSIS — Z86711 Personal history of pulmonary embolism: Secondary | ICD-10-CM | POA: Diagnosis not present

## 2019-12-04 DIAGNOSIS — I69320 Aphasia following cerebral infarction: Secondary | ICD-10-CM | POA: Diagnosis not present

## 2019-12-04 DIAGNOSIS — I120 Hypertensive chronic kidney disease with stage 5 chronic kidney disease or end stage renal disease: Secondary | ICD-10-CM | POA: Diagnosis not present

## 2019-12-04 DIAGNOSIS — I69351 Hemiplegia and hemiparesis following cerebral infarction affecting right dominant side: Secondary | ICD-10-CM | POA: Diagnosis not present

## 2019-12-04 DIAGNOSIS — N186 End stage renal disease: Secondary | ICD-10-CM | POA: Diagnosis not present

## 2019-12-04 DIAGNOSIS — E1122 Type 2 diabetes mellitus with diabetic chronic kidney disease: Secondary | ICD-10-CM | POA: Diagnosis not present

## 2019-12-04 DIAGNOSIS — C9000 Multiple myeloma not having achieved remission: Secondary | ICD-10-CM | POA: Diagnosis not present

## 2019-12-04 DIAGNOSIS — M1030 Gout due to renal impairment, unspecified site: Secondary | ICD-10-CM | POA: Diagnosis not present

## 2019-12-04 DIAGNOSIS — D509 Iron deficiency anemia, unspecified: Secondary | ICD-10-CM | POA: Diagnosis not present

## 2019-12-04 DIAGNOSIS — I480 Paroxysmal atrial fibrillation: Secondary | ICD-10-CM | POA: Diagnosis not present

## 2019-12-04 DIAGNOSIS — Z992 Dependence on renal dialysis: Secondary | ICD-10-CM | POA: Diagnosis not present

## 2019-12-04 DIAGNOSIS — E1140 Type 2 diabetes mellitus with diabetic neuropathy, unspecified: Secondary | ICD-10-CM | POA: Diagnosis not present

## 2019-12-04 DIAGNOSIS — G4730 Sleep apnea, unspecified: Secondary | ICD-10-CM | POA: Diagnosis not present

## 2019-12-04 DIAGNOSIS — Z8701 Personal history of pneumonia (recurrent): Secondary | ICD-10-CM | POA: Diagnosis not present

## 2019-12-05 ENCOUNTER — Telehealth: Payer: Self-pay

## 2019-12-05 DIAGNOSIS — N2581 Secondary hyperparathyroidism of renal origin: Secondary | ICD-10-CM | POA: Diagnosis not present

## 2019-12-05 DIAGNOSIS — Z23 Encounter for immunization: Secondary | ICD-10-CM | POA: Diagnosis not present

## 2019-12-05 DIAGNOSIS — D631 Anemia in chronic kidney disease: Secondary | ICD-10-CM | POA: Diagnosis not present

## 2019-12-05 DIAGNOSIS — D509 Iron deficiency anemia, unspecified: Secondary | ICD-10-CM | POA: Diagnosis not present

## 2019-12-05 DIAGNOSIS — N186 End stage renal disease: Secondary | ICD-10-CM | POA: Diagnosis not present

## 2019-12-05 DIAGNOSIS — E1129 Type 2 diabetes mellitus with other diabetic kidney complication: Secondary | ICD-10-CM | POA: Diagnosis not present

## 2019-12-05 DIAGNOSIS — Z992 Dependence on renal dialysis: Secondary | ICD-10-CM | POA: Diagnosis not present

## 2019-12-05 MED FILL — clonazePAM 0.5 MG TABS: 0.5 | 35 days supply | Qty: 30 | Fill #3

## 2019-12-06 ENCOUNTER — Inpatient Hospital Stay: Payer: Medicare Other

## 2019-12-06 ENCOUNTER — Other Ambulatory Visit: Payer: Self-pay

## 2019-12-06 ENCOUNTER — Encounter: Payer: Self-pay | Admitting: *Deleted

## 2019-12-06 VITALS — BP 124/61 | HR 85 | Temp 97.8°F | Resp 16

## 2019-12-06 DIAGNOSIS — C9000 Multiple myeloma not having achieved remission: Secondary | ICD-10-CM

## 2019-12-06 DIAGNOSIS — Z5112 Encounter for antineoplastic immunotherapy: Secondary | ICD-10-CM | POA: Diagnosis not present

## 2019-12-06 LAB — CBC WITH DIFFERENTIAL (CANCER CENTER ONLY)
Abs Immature Granulocytes: 0.02 10*3/uL (ref 0.00–0.07)
Basophils Absolute: 0.1 10*3/uL (ref 0.0–0.1)
Basophils Relative: 1 %
Eosinophils Absolute: 0.2 10*3/uL (ref 0.0–0.5)
Eosinophils Relative: 3 %
HCT: 30.6 % — ABNORMAL LOW (ref 39.0–52.0)
Hemoglobin: 11.3 g/dL — ABNORMAL LOW (ref 13.0–17.0)
Immature Granulocytes: 0 %
Lymphocytes Relative: 2 %
Lymphs Abs: 0.2 10*3/uL — ABNORMAL LOW (ref 0.7–4.0)
MCH: 35.2 pg — ABNORMAL HIGH (ref 26.0–34.0)
MCHC: 36.9 g/dL — ABNORMAL HIGH (ref 30.0–36.0)
MCV: 95.3 fL (ref 80.0–100.0)
Monocytes Absolute: 0.2 10*3/uL (ref 0.1–1.0)
Monocytes Relative: 3 %
Neutro Abs: 7.8 10*3/uL — ABNORMAL HIGH (ref 1.7–7.7)
Neutrophils Relative %: 91 %
Platelet Count: 315 10*3/uL (ref 150–400)
RBC: 3.21 MIL/uL — ABNORMAL LOW (ref 4.22–5.81)
RDW: 14.3 % (ref 11.5–15.5)
WBC Count: 8.5 10*3/uL (ref 4.0–10.5)
nRBC: 40.9 % — ABNORMAL HIGH (ref 0.0–0.2)

## 2019-12-06 LAB — CMP (CANCER CENTER ONLY)
ALT: 9 U/L (ref 0–44)
AST: 11 U/L — ABNORMAL LOW (ref 15–41)
Albumin: 4.2 g/dL (ref 3.5–5.0)
Alkaline Phosphatase: 112 U/L (ref 38–126)
Anion gap: 15 (ref 5–15)
BUN: 32 mg/dL — ABNORMAL HIGH (ref 6–20)
CO2: 31 mmol/L (ref 22–32)
Calcium: 10 mg/dL (ref 8.9–10.3)
Chloride: 95 mmol/L — ABNORMAL LOW (ref 98–111)
Creatinine: 8.18 mg/dL (ref 0.61–1.24)
GFR, Est AFR Am: 8 mL/min — ABNORMAL LOW (ref >60)
GFR, Estimated: 7 mL/min — ABNORMAL LOW (ref >60)
Glucose, Bld: 181 mg/dL — ABNORMAL HIGH (ref 70–99)
Potassium: 4.3 mmol/L (ref 3.5–5.1)
Sodium: 141 mmol/L (ref 135–145)
Total Bilirubin: 1.3 mg/dL — ABNORMAL HIGH (ref 0.3–1.2)
Total Protein: 7.1 g/dL (ref 6.5–8.1)

## 2019-12-06 MED ORDER — PROCHLORPERAZINE MALEATE 10 MG PO TABS
10.0000 mg | ORAL_TABLET | Freq: Once | ORAL | Status: AC
  Administered 2019-12-06: 10 mg via ORAL

## 2019-12-06 MED ORDER — PROCHLORPERAZINE MALEATE 10 MG PO TABS
ORAL_TABLET | ORAL | Status: AC
  Filled 2019-12-06: qty 1

## 2019-12-06 MED ORDER — BORTEZOMIB CHEMO SQ INJECTION 3.5 MG (2.5MG/ML)
1.5000 mg/m2 | Freq: Once | INTRAMUSCULAR | Status: AC
  Administered 2019-12-06: 3 mg via SUBCUTANEOUS
  Filled 2019-12-06: qty 1.2

## 2019-12-06 NOTE — Progress Notes (Signed)
CRITICAL VALUE ALERT  Critical Value:  Crt 8.18  Date & Time Notied:  12/06/19 at 1135  Provider Notified: Lanelle Bal RN with Dr. Alen Blew  Orders Received/Actions taken: RN Lanelle Bal verbalized understanding and states she will alert MD

## 2019-12-06 NOTE — Patient Instructions (Signed)
[facility] Discharge Instructions for Patients Receiving Chemotherapy  Today you received the following chemotherapy agents: Velcade  To help prevent nausea and vomiting after your treatment, we encourage you to take your nausea medication as directed.   If you develop nausea and vomiting that is not controlled by your nausea medication, call the clinic.   BELOW ARE SYMPTOMS THAT SHOULD BE REPORTED IMMEDIATELY:  *FEVER GREATER THAN 100.5 F  *CHILLS WITH OR WITHOUT FEVER  NAUSEA AND VOMITING THAT IS NOT CONTROLLED WITH YOUR NAUSEA MEDICATION  *UNUSUAL SHORTNESS OF BREATH  *UNUSUAL BRUISING OR BLEEDING  TENDERNESS IN MOUTH AND THROAT WITH OR WITHOUT PRESENCE OF ULCERS  *URINARY PROBLEMS  *BOWEL PROBLEMS  UNUSUAL RASH Items with * indicate a potential emergency and should be followed up as soon as possible.  Feel free to call the clinic should you have any questions or concerns. The clinic phone number is [phone].  Please show the [place] at check-in to the Emergency Department and triage nurse.

## 2019-12-07 DIAGNOSIS — Z992 Dependence on renal dialysis: Secondary | ICD-10-CM | POA: Diagnosis not present

## 2019-12-07 DIAGNOSIS — Z23 Encounter for immunization: Secondary | ICD-10-CM | POA: Diagnosis not present

## 2019-12-07 DIAGNOSIS — D631 Anemia in chronic kidney disease: Secondary | ICD-10-CM | POA: Diagnosis not present

## 2019-12-07 DIAGNOSIS — N186 End stage renal disease: Secondary | ICD-10-CM | POA: Diagnosis not present

## 2019-12-07 DIAGNOSIS — D509 Iron deficiency anemia, unspecified: Secondary | ICD-10-CM | POA: Diagnosis not present

## 2019-12-07 DIAGNOSIS — E1129 Type 2 diabetes mellitus with other diabetic kidney complication: Secondary | ICD-10-CM | POA: Diagnosis not present

## 2019-12-07 DIAGNOSIS — N2581 Secondary hyperparathyroidism of renal origin: Secondary | ICD-10-CM | POA: Diagnosis not present

## 2019-12-07 NOTE — Progress Notes (Signed)
Pharmacist Chemotherapy Monitoring - Follow Up Assessment    I verify that I have reviewed each item in the below checklist:  . Regimen for the patient is scheduled for the appropriate day and plan matches scheduled date. [name] Appropriate non-routine labs are ordered dependent on drug ordered. . If applicable, additional medications reviewed and ordered per protocol based on lifetime cumulative doses and/or treatment regimen.   Plan for follow-up and/or issues identified: No . I-vent associated with next due treatment: No . MD and/or nursing notified: No  Britt Boozer 12/07/2019 8:54 AM

## 2019-12-08 ENCOUNTER — Other Ambulatory Visit: Payer: Self-pay

## 2019-12-08 ENCOUNTER — Encounter: Payer: Self-pay | Admitting: Physical Medicine & Rehabilitation

## 2019-12-08 ENCOUNTER — Encounter: Payer: Medicare Other | Attending: Physical Medicine & Rehabilitation | Admitting: Physical Medicine & Rehabilitation

## 2019-12-08 VITALS — BP 134/84 | HR 94 | Ht 70.0 in | Wt 183.0 lb

## 2019-12-08 DIAGNOSIS — N186 End stage renal disease: Secondary | ICD-10-CM

## 2019-12-08 DIAGNOSIS — R269 Unspecified abnormalities of gait and mobility: Secondary | ICD-10-CM | POA: Diagnosis not present

## 2019-12-08 DIAGNOSIS — G40909 Epilepsy, unspecified, not intractable, without status epilepticus: Secondary | ICD-10-CM | POA: Diagnosis not present

## 2019-12-08 DIAGNOSIS — N2581 Secondary hyperparathyroidism of renal origin: Secondary | ICD-10-CM | POA: Diagnosis not present

## 2019-12-08 DIAGNOSIS — Z23 Encounter for immunization: Secondary | ICD-10-CM | POA: Diagnosis not present

## 2019-12-08 DIAGNOSIS — E1129 Type 2 diabetes mellitus with other diabetic kidney complication: Secondary | ICD-10-CM | POA: Diagnosis not present

## 2019-12-08 DIAGNOSIS — C9000 Multiple myeloma not having achieved remission: Secondary | ICD-10-CM | POA: Diagnosis not present

## 2019-12-08 DIAGNOSIS — R7881 Bacteremia: Secondary | ICD-10-CM | POA: Diagnosis not present

## 2019-12-08 DIAGNOSIS — Z992 Dependence on renal dialysis: Secondary | ICD-10-CM | POA: Diagnosis not present

## 2019-12-08 DIAGNOSIS — D509 Iron deficiency anemia, unspecified: Secondary | ICD-10-CM | POA: Diagnosis not present

## 2019-12-08 DIAGNOSIS — I120 Hypertensive chronic kidney disease with stage 5 chronic kidney disease or end stage renal disease: Secondary | ICD-10-CM | POA: Diagnosis not present

## 2019-12-08 DIAGNOSIS — I69351 Hemiplegia and hemiparesis following cerebral infarction affecting right dominant side: Secondary | ICD-10-CM | POA: Diagnosis not present

## 2019-12-08 DIAGNOSIS — E1122 Type 2 diabetes mellitus with diabetic chronic kidney disease: Secondary | ICD-10-CM | POA: Diagnosis not present

## 2019-12-08 DIAGNOSIS — G8110 Spastic hemiplegia affecting unspecified side: Secondary | ICD-10-CM | POA: Diagnosis not present

## 2019-12-08 DIAGNOSIS — S065X9A Traumatic subdural hemorrhage with loss of consciousness of unspecified duration, initial encounter: Secondary | ICD-10-CM

## 2019-12-08 DIAGNOSIS — D631 Anemia in chronic kidney disease: Secondary | ICD-10-CM | POA: Diagnosis not present

## 2019-12-08 DIAGNOSIS — I69320 Aphasia following cerebral infarction: Secondary | ICD-10-CM | POA: Diagnosis not present

## 2019-12-08 DIAGNOSIS — S065XAA Traumatic subdural hemorrhage with loss of consciousness status unknown, initial encounter: Secondary | ICD-10-CM

## 2019-12-08 MED FILL — BASAGLAR 100 UNIT/ML KWIKPE: 100 | 50 days supply | Qty: 3 | Fill #0

## 2019-12-08 NOTE — Progress Notes (Addendum)
Subjective:    Patient ID: [place], male    DOB: 03/22/1966, 54 y.o.   MRN: [id number]  HPI [place] history of T2DM, A fib, ESRD-HD MWF, CVA, DVT/PE s/p IVC filter, seizure disorder, MM with recent admission to [facility] for stem cell transplant but procedure aborted due to findings of proteus mirabilis bacteremia, aspiration PNA, acute on chronic SDH, findings of bilateral renal calculi as well as breakthrough seizures presents for follow up for SDH.  Last clinic visit on 09/13/19. Since that time, pt was admitted to the hospital for encephalopathy, notes reviewed.  Discussed with wife as well.  Wife supplements history. Patient is in therapies.  He is following up with Neurology. His antiepileptics were increased. He has completed abx. CBGs have been labile due to Chemo and steroids.  He is on sliding scale. Denies falls.  Pain Inventory Average Pain 0 Pain Right Now 0 My pain is n/a  In the last 24 hours, has pain interfered with the following? General activity 2 Relation with others 2 Enjoyment of life 2 What TIME of day is your pain at its worst? n/a Sleep (in general) Good  Pain is worse with: n/a Pain improves with: n/a Relief from Meds: 0  Mobility walk with assistance use a walker how many minutes can you walk? 30 ability to climb steps?  yes do you drive?  no  Function disabled: date disabled 2017  Neuro/Psych weakness trouble walking  Prior Studies bone scan x-rays CT/MRI  Physicians involved in your care Primary care . Neurologist .   Family History  Problem Relation Age of Onset  . Hypertension Mother   . Hypertension Father   . Stroke Father   . Sickle cell anemia Brother   . Diabetes Mother   . Sickle cell anemia Brother   . Diabetes type I Brother   . Kidney disease Brother    Social History   Socioeconomic History  . Marital status: Married    Spouse name: sylvia  . Number of children: 1  . Years of education: college  . Highest  education level: Not on file  Occupational History  . Occupation: [employer]  . Occupation: disability  Tobacco Use  . Smoking status: Never Smoker  . Smokeless tobacco: Never Used  Substance and Sexual Activity  . Alcohol use: Not Currently  . Drug use: Never  . Sexual activity: Yes    Partners: Female  Other Topics Concern  . Not on file  Social History Narrative   ** Merged History Encounter **       Pt lives in 2 story home with his wife and 1 son Has masters degree in psychology Currently disabled.     Social Determinants of Health   Financial Resource Strain: Low Risk   . Difficulty of Paying Living Expenses: Not hard at all  Food Insecurity: No Food Insecurity  . Worried About Charity fundraiser in the Last Year: Never true  . Ran Out of Food in the Last Year: Never true  Transportation Needs: No Transportation Needs  . Lack of Transportation (Medical): No  . Lack of Transportation (Non-Medical): No  Physical Activity: Inactive  . Days of Exercise per Week: 0 days  . Minutes of Exercise per Session: 0 min  Stress: No Stress Concern Present  . Feeling of Stress : Not at all  Social Connections:   . Frequency of Communication with Friends and Family:   . Frequency of Social Gatherings with  Friends and Family:   . Attends Religious Services:   . Active Member of Clubs or Organizations:   . Attends Archivist Meetings:   [name] Marital Status:    Past Surgical History:  Procedure Laterality Date  . AV FISTULA PLACEMENT Right 12/05/2015   Procedure: INSERTION OF ARTERIOVENOUS (AV) GORE-TEX GRAFT ARM;  Surgeon: Elam Dutch, MD;  Location: [place];  Service: Vascular;  Laterality: Right;  . BONE MARROW BIOPSY    . CHOLECYSTECTOMY    . CHOLECYSTECTOMY  1990's?  . COLONOSCOPY WITH PROPOFOL N/A 01/22/2017   Procedure: COLONOSCOPY WITH PROPOFOL;  Surgeon: Wilford Corner, MD;  Location: [facility];  Service: Endoscopy;  Laterality: N/A;  . EYE  SURGERY Right   . INSERTION OF DIALYSIS CATHETER Right 10/16/2015   Procedure: INSERTION OF PALINDROME DIALYSIS CATHETER ;  Surgeon: Elam Dutch, MD;  Location: [place];  Service: Vascular;  Laterality: Right;  . OTHER SURGICAL HISTORY     Retinal surgery  . PARS PLANA VITRECTOMY  02/17/2012   Procedure: PARS PLANA VITRECTOMY WITH 25 GAUGE;  Surgeon: Hayden Pedro, MD;  Location: [facility];  Service: Ophthalmology;  Laterality: Right;  . PERIPHERAL VASCULAR CATHETERIZATION N/A 03/20/2016   Procedure: A/V Shuntogram/Fistulagram;  Surgeon: Conrad Dickens, MD;  Location: [facility];  Service: Cardiovascular;  Laterality: N/A;  . TEE WITHOUT CARDIOVERSION N/A 12/02/2016   Procedure: TRANSESOPHAGEAL ECHOCARDIOGRAM (TEE);  Surgeon: Larey Dresser, MD;  Location: [place];  Service: Cardiovascular;  Laterality: N/A;   Past Medical History:  Diagnosis Date  . A-fib ([place])   . Anemia   . Asthma   . DM type 2 (diabetes mellitus, type 2) ([place]) 06/09/2019  . ESRD (end stage renal disease) on dialysis ([place])   . Essential hypertension 06/09/2019  . GIB (gastrointestinal bleeding)    Recurrent episodes- 09/2014, 09/2015 and 05/2016  . Gout   . History of recent blood transfusion 10/27/14   2 Units PRBC's  . Hyperkalemia 07/2011  . Multiple myeloma ([place])   . OSA on CPAP   . Pulmonary embolism ([place]) 07/2011; 09/27/2014   a. Bilat PE 07/2011 - unclear cause, tx with 6 months Coumadin.;   . Seizure disorder ([place]) 06/09/2019  . Sepsis ([place])   . Sickle cell-thalassemia disease ([place])    a. Sickle cell trait.  . Sleep apnea   . Stroke ([place]) 09/2015   R-MCA, L-MCA, PCA and bilateral cerebellar complicated by DVT/PE  . Subdural hematoma ([facility]) 05/2019   BP 134/84   Pulse 94   Ht _0  (1.778 m)   Wt 183 lb (83 kg)   SpO2 98%   BMI 26.26 kg/m   Opioid Risk Score:   Fall Risk Score:  `1  Depression screen PHQ 2/9  Depression screen [facility] 2/9 09/22/2019 09/13/2019 03/17/2019  Decreased Interest 0  0 0  Down, Depressed, Hopeless 0 0 0  PHQ - 2 Score 0 0 0  Altered sleeping - - 0  Tired, decreased energy - - 0  Change in appetite - - 0  Feeling bad or failure about yourself  - - 0  Trouble concentrating - - 0  Moving slowly or fidgety/restless - - 1  Suicidal thoughts - - 0  PHQ-9 Score - - 1  Difficult doing work/chores - - Not difficult at all  Some recent data might be hidden    Review of Systems  Endocrine:       High blood sugar  Musculoskeletal: Positive for arthralgias, gait problem and myalgias.  Neurological: Positive for seizures and weakness.       Objective:   Physical Exam Constitutional: NAD.  HENT: Normocephalic.  Atraumatic. Respiratory: Normal effort.  No stridor. Psych: Flat. Musc: Gait: Slow cadence Neurologic: Alert Dysarthria Facial weakness Motor: RUE: 4/4 RLE: 4/5 MAS: Right  wrist flexors 1/4   Finger flexors 1/4    Assessment & Plan:  [place] history of T2DM, A fib, ESRD-HD MWF, CVA, DVT/PE s/p IVC filter, seizure disorder, MM with recent admission to [facility] for stem cell transplant but procedure aborted due to findings of proteus mirabilis bacteremia, aspiration PNA, acute on chronic SDH, findings of bilateral renal calculi as well as breakthrough seizures presents for follow up for SDH.  1. Impaired ADLs/mobility and functionsecondary toacute on chronic SDH with 5 mm of midline shift to left- with R hemiparesis and dysarthria- f/u with [facility]U after rehab; no head CT unless has a change in function, per NSU             Continue therapies - currently HH.  Plan to transition to outpatient once completed [facility] and after 2 weeks from 2nd vaccine injection             Cont follow up with Neurology  2. Seizure disorder:   Antiepilectics adjusted             Cont follow up with Neurology  3. T2DM with hyperglycemia:              Labile with Chemo and Steroids  Cont SSI  4. Spasticity in setting of 2017 CVA with previous R  hemiparesis  Controlled at present  5. Gait abnormality             Continue walker for safety             Cont therapies

## 2019-12-09 ENCOUNTER — Other Ambulatory Visit: Payer: Self-pay | Admitting: Internal Medicine

## 2019-12-09 DIAGNOSIS — N2581 Secondary hyperparathyroidism of renal origin: Secondary | ICD-10-CM | POA: Diagnosis not present

## 2019-12-09 DIAGNOSIS — Z992 Dependence on renal dialysis: Secondary | ICD-10-CM | POA: Diagnosis not present

## 2019-12-09 DIAGNOSIS — D509 Iron deficiency anemia, unspecified: Secondary | ICD-10-CM | POA: Diagnosis not present

## 2019-12-09 DIAGNOSIS — N186 End stage renal disease: Secondary | ICD-10-CM | POA: Diagnosis not present

## 2019-12-09 DIAGNOSIS — Z23 Encounter for immunization: Secondary | ICD-10-CM | POA: Diagnosis not present

## 2019-12-09 DIAGNOSIS — D631 Anemia in chronic kidney disease: Secondary | ICD-10-CM | POA: Diagnosis not present

## 2019-12-09 DIAGNOSIS — E1129 Type 2 diabetes mellitus with other diabetic kidney complication: Secondary | ICD-10-CM | POA: Diagnosis not present

## 2019-12-10 MED FILL — UNIFINE PENTIPS 8MM 31G: 31G X 8 MM | 90 days supply | Qty: 100 | Fill #0

## 2019-12-12 DIAGNOSIS — D631 Anemia in chronic kidney disease: Secondary | ICD-10-CM | POA: Diagnosis not present

## 2019-12-12 DIAGNOSIS — N2581 Secondary hyperparathyroidism of renal origin: Secondary | ICD-10-CM | POA: Diagnosis not present

## 2019-12-12 DIAGNOSIS — E1129 Type 2 diabetes mellitus with other diabetic kidney complication: Secondary | ICD-10-CM | POA: Diagnosis not present

## 2019-12-12 DIAGNOSIS — Z23 Encounter for immunization: Secondary | ICD-10-CM | POA: Diagnosis not present

## 2019-12-12 DIAGNOSIS — N186 End stage renal disease: Secondary | ICD-10-CM | POA: Diagnosis not present

## 2019-12-12 DIAGNOSIS — Z992 Dependence on renal dialysis: Secondary | ICD-10-CM | POA: Diagnosis not present

## 2019-12-12 DIAGNOSIS — D509 Iron deficiency anemia, unspecified: Secondary | ICD-10-CM | POA: Diagnosis not present

## 2019-12-13 ENCOUNTER — Other Ambulatory Visit: Payer: Self-pay

## 2019-12-13 ENCOUNTER — Inpatient Hospital Stay (HOSPITAL_BASED_OUTPATIENT_CLINIC_OR_DEPARTMENT_OTHER): Payer: Medicare Other | Admitting: Oncology

## 2019-12-13 ENCOUNTER — Encounter: Payer: Self-pay | Admitting: Physician Assistant

## 2019-12-13 ENCOUNTER — Inpatient Hospital Stay: Payer: Medicare Other | Attending: Oncology

## 2019-12-13 ENCOUNTER — Ambulatory Visit (INDEPENDENT_AMBULATORY_CARE_PROVIDER_SITE_OTHER): Payer: Medicare Other | Admitting: Physician Assistant

## 2019-12-13 ENCOUNTER — Other Ambulatory Visit: Payer: Medicare Other

## 2019-12-13 ENCOUNTER — Inpatient Hospital Stay: Payer: Medicare Other

## 2019-12-13 VITALS — BP 126/67 | HR 78 | Temp 98.7°F | Resp 17 | Ht 70.0 in | Wt 183.6 lb

## 2019-12-13 VITALS — BP 122/70 | HR 87 | Ht 70.0 in | Wt 184.0 lb

## 2019-12-13 DIAGNOSIS — C9000 Multiple myeloma not having achieved remission: Secondary | ICD-10-CM

## 2019-12-13 DIAGNOSIS — Z992 Dependence on renal dialysis: Secondary | ICD-10-CM

## 2019-12-13 DIAGNOSIS — I272 Pulmonary hypertension, unspecified: Secondary | ICD-10-CM | POA: Diagnosis not present

## 2019-12-13 DIAGNOSIS — N186 End stage renal disease: Secondary | ICD-10-CM | POA: Diagnosis not present

## 2019-12-13 DIAGNOSIS — G8110 Spastic hemiplegia affecting unspecified side: Secondary | ICD-10-CM | POA: Diagnosis not present

## 2019-12-13 DIAGNOSIS — I480 Paroxysmal atrial fibrillation: Secondary | ICD-10-CM | POA: Diagnosis not present

## 2019-12-13 DIAGNOSIS — F445 Conversion disorder with seizures or convulsions: Secondary | ICD-10-CM | POA: Diagnosis not present

## 2019-12-13 DIAGNOSIS — Q2112 Patent foramen ovale: Secondary | ICD-10-CM

## 2019-12-13 DIAGNOSIS — R269 Unspecified abnormalities of gait and mobility: Secondary | ICD-10-CM | POA: Diagnosis not present

## 2019-12-13 DIAGNOSIS — Q211 Atrial septal defect: Secondary | ICD-10-CM | POA: Diagnosis not present

## 2019-12-13 DIAGNOSIS — G40909 Epilepsy, unspecified, not intractable, without status epilepticus: Secondary | ICD-10-CM | POA: Diagnosis not present

## 2019-12-13 DIAGNOSIS — Z5112 Encounter for antineoplastic immunotherapy: Secondary | ICD-10-CM | POA: Diagnosis not present

## 2019-12-13 DIAGNOSIS — R2689 Other abnormalities of gait and mobility: Secondary | ICD-10-CM | POA: Diagnosis not present

## 2019-12-13 LAB — CBC WITH DIFFERENTIAL (CANCER CENTER ONLY)
Abs Immature Granulocytes: 0 10*3/uL (ref 0.00–0.07)
Basophils Absolute: 0.1 10*3/uL (ref 0.0–0.1)
Basophils Relative: 1 %
Eosinophils Absolute: 0.2 10*3/uL (ref 0.0–0.5)
Eosinophils Relative: 2 %
HCT: 28.4 % — ABNORMAL LOW (ref 39.0–52.0)
Hemoglobin: 10.5 g/dL — ABNORMAL LOW (ref 13.0–17.0)
Lymphocytes Relative: 3 %
Lymphs Abs: 0.3 10*3/uL — ABNORMAL LOW (ref 0.7–4.0)
MCH: 35.2 pg — ABNORMAL HIGH (ref 26.0–34.0)
MCHC: 37 g/dL — ABNORMAL HIGH (ref 30.0–36.0)
MCV: 95.3 fL (ref 80.0–100.0)
Monocytes Absolute: 0.2 10*3/uL (ref 0.1–1.0)
Monocytes Relative: 2 %
Neutro Abs: 9.6 10*3/uL — ABNORMAL HIGH (ref 1.7–7.7)
Neutrophils Relative %: 92 %
Platelet Count: 264 10*3/uL (ref 150–400)
RBC: 2.98 MIL/uL — ABNORMAL LOW (ref 4.22–5.81)
RDW: 14.6 % (ref 11.5–15.5)
WBC Count: 10.4 10*3/uL (ref 4.0–10.5)
nRBC: 45 % — ABNORMAL HIGH (ref 0.0–0.2)

## 2019-12-13 LAB — CMP (CANCER CENTER ONLY)
ALT: 9 U/L (ref 0–44)
AST: 8 U/L — ABNORMAL LOW (ref 15–41)
Albumin: 4 g/dL (ref 3.5–5.0)
Alkaline Phosphatase: 114 U/L (ref 38–126)
Anion gap: 15 (ref 5–15)
BUN: 36 mg/dL — ABNORMAL HIGH (ref 6–20)
CO2: 31 mmol/L (ref 22–32)
Calcium: 9.8 mg/dL (ref 8.9–10.3)
Chloride: 96 mmol/L — ABNORMAL LOW (ref 98–111)
Creatinine: 8.19 mg/dL (ref 0.61–1.24)
GFR, Est AFR Am: 8 mL/min — ABNORMAL LOW (ref >60)
GFR, Estimated: 7 mL/min — ABNORMAL LOW (ref >60)
Glucose, Bld: 233 mg/dL — ABNORMAL HIGH (ref 70–99)
Potassium: 4.5 mmol/L (ref 3.5–5.1)
Sodium: 142 mmol/L (ref 135–145)
Total Bilirubin: 0.9 mg/dL (ref 0.3–1.2)
Total Protein: 6.8 g/dL (ref 6.5–8.1)

## 2019-12-13 MED ORDER — PROCHLORPERAZINE MALEATE 10 MG PO TABS
ORAL_TABLET | ORAL | Status: AC
  Filled 2019-12-13: qty 1

## 2019-12-13 MED ORDER — BORTEZOMIB CHEMO SQ INJECTION 3.5 MG (2.5MG/ML)
1.5000 mg/m2 | Freq: Once | INTRAMUSCULAR | Status: AC
  Administered 2019-12-13: 3 mg via SUBCUTANEOUS
  Filled 2019-12-13: qty 1.2

## 2019-12-13 MED ORDER — PROCHLORPERAZINE MALEATE 10 MG PO TABS
10.0000 mg | ORAL_TABLET | Freq: Once | ORAL | Status: AC
  Administered 2019-12-13: 10 mg via ORAL

## 2019-12-13 NOTE — Progress Notes (Signed)
Hematology and Oncology Follow Up Visit  [place] [id number] December 18, 1965 54 y.o. 12/13/2019 10:43 AM    Principle Diagnosis: 54 year old man with multiple myeloma diagnosed in 2019.  He was found to have IgG kappa subtype arising from MGUS.   Past therapy: Velcade dexamethasone and cyclophosphamide weekly started on 05/25/2018.  Velcade is given at 1.5 mg/m weekly, dexamethasone is 20 mg weekly and and Cytoxan is given at 650 mg total dose.  Last treatment was given in August 2020.    Therapy was held today in anticipation of high-dose chemotherapy and autologous stem cell transplant.  He did not receive stem cell transplant due to subdural hematoma and sepsis in September 2020.  Current therapy:  Velcade at 1.5 mg per metered squared subcutaneously weekly, dexamethasone 20 mg weekly, daratumumab 1800 mg subcutaneous injection every 2 weeks.  Cycle 1 started on 08/30/2019. He is here for cycle 15 of therapy.  Interim History:  Mr. Jaso is here for a follow-up visit.  Since the last visit, he continues to tolerate his treatments without any major complaints.  Nausea, vomiting or abdominal pain.  He denies any worsening neuropathy or injection related issues.  His mobility is improving with physical therapy and has not had any falls or syncope.  He is ambulating with the help of all walker at home.                       Medications: Updated today.  Current Outpatient Medications:  .  acetaminophen (TYLENOL) 325 MG tablet, Take 1-2 tablets (325-650 mg total) by mouth every 4 (four) hours as needed for mild pain., Disp:  , Rfl:  .  acyclovir (ZOVIRAX) 400 MG tablet, TAKE 1 TABLET (400 MG TOTAL) BY MOUTH DAILY., Disp: 90 tablet, Rfl: 0 .  albuterol (PROAIR HFA) 108 (90 Base) MCG/ACT inhaler, Inhale two puffs every 4-6 hours if needed for cough or wheeze, Disp: 1 Inhaler, Rfl: 1 .  Alcohol Swabs (SM ALCOHOL PREP) 70 % PADS, , Disp: , Rfl:  .  AURYXIA 1 GM 210 MG(Fe)  tablet, Take 210 mg by mouth 3 (three) times daily., Disp: , Rfl:  .  calcitRIOL (ROCALTROL) 0.25 MCG capsule, Take by mouth 3 (three) times a week., Disp: , Rfl:  .  calcium carbonate (TUMS - DOSED IN MG ELEMENTAL CALCIUM) 500 MG chewable tablet, Chew 2 tablets by mouth at bedtime., Disp: , Rfl:  .  camphor-menthol (SARNA) lotion, Apply 1 application topically every 8 (eight) hours as needed for itching., Disp: 222 mL, Rfl: 0 .  Cetirizine HCl 10 MG CAPS, Take 1 capsule by mouth daily., Disp: , Rfl:  .  cinacalcet (SENSIPAR) 60 MG tablet, Take 60 mg by mouth every evening. ON HOLD FOR DIALYSIS PER WIFE, Disp: , Rfl:  .  clonazePAM (KLONOPIN) 0.5 MG tablet, Take 2 tablets every Monday, Wednesday, and Friday with hemodialysis, Disp: 30 tablet, Rfl: 5 .  DARZALEX FASPRO 1800-30000 MG-UT/15ML SOLN, Inject 1, Disp: , Rfl:  .  dexamethasone (DECADRON) 4 MG tablet, Take 5 tablets (20 mg total) by mouth once a week. Take 5 tablets (20 mg total) by mouth once a week on day of chemo., Disp: 40 tablet, Rfl: 3 .  fluticasone furoate-vilanterol (BREO ELLIPTA) 200-25 MCG/INH AEPB, Inhale 1 puff into the lungs daily., Disp: , Rfl:  .  FREESTYLE LITE test strip, , Disp: , Rfl:  .  hydrOXYzine (ATARAX/VISTARIL) 25 MG tablet, Take 1 tablet (25 mg total) by mouth  every 8 (eight) hours as needed for itching., Disp: 30 tablet, Rfl: 0 .  Insulin Lispro (HUMALOG KWIKPEN [place]), Inject 2-4 Units into the skin 3 (three) times daily before meals. Sliding scale: 150-250 =2 units, 250 -350=3 units, 350 += 4 units, Disp: , Rfl:  .  ketorolac (ACULAR) 0.5 % ophthalmic solution, Place 1 drop into both eyes 4 (four) times daily., Disp: , Rfl:  .  lacosamide (VIMPAT) 200 MG TABS tablet, Take 1 tablet twice a day except on dialysis days M-W-F give 1 tab in AM, 1 tab after dialysis, 1 tab in PM., Disp: 90 tablet, Rfl: 5 .  lamoTRIgine (LAMICTAL) 200 MG tablet, Take 1 tablet (200 mg total) by mouth 2 (two) times daily., Disp: 60 tablet,  Rfl: 11 .  Lancets (FREESTYLE) lancets, , Disp: , Rfl:  .  Methoxy PEG-Epoetin Beta (MIRCERA IJ), Mircera, Disp: , Rfl:  .  midodrine (PROAMATINE) 5 MG tablet, Take one tablet by mouth 30 minutes before dialysis., Disp: 30 tablet, Rfl: 1 .  multivitamin (RENA-VIT) TABS tablet, Take 1 tablet by mouth at bedtime., Disp: , Rfl:  .  Nutritional Supplements (FEEDING SUPPLEMENT, NEPRO CARB STEADY,) LIQD, Take 237 mLs by mouth 3 (three) times daily as needed (Supplement)., Disp: 237 mL, Rfl: 12 .  pantoprazole (PROTONIX) 40 MG tablet, TAKE 1 TABLET BY MOUTH ONCE DAILY, Disp: 90 tablet, Rfl: 1 .  prochlorperazine (COMPAZINE) 10 MG tablet, TAKE 1 TABLET (10 MG TOTAL) BY MOUTH EVERY 6 (SIX) HOURS AS NEEDED FOR NAUSEA OR VOMITING., Disp: 30 tablet, Rfl: 0 .  UNIFINE PENTIPS 31G X 8 MM MISC, USE AS DIRECTED DAILY AFTER SUPPER, Disp: 100 each, Rfl: 3  Allergies:  Allergies  Allergen Reactions  . Codeine Rash and Other (See Comments)    Unknown reaction (patient says it was more serious than just a rash, but he can't remember what happened) Unknown reaction (patient says it was more serious than just a rash, but he can't remember what happened)  . Codeine Swelling      Physical Exam:    Blood pressure 126/67, pulse 78, temperature 98.7 F (37.1 C), temperature source Temporal, resp. rate 17, height 5' 10" (1.778 m), weight 183 lb 9.6 oz (83.3 kg), SpO2 98 %.     ECOG: 1   General appearance: Alert, awake without any distress. Head: Atraumatic without abnormalities Oropharynx: Without any thrush or ulcers. Eyes: No scleral icterus. Lymph nodes: No lymphadenopathy noted in the cervical, supraclavicular, or axillary nodes Heart:regular rate and rhythm, without any murmurs or gallops.   Lung: Clear to auscultation without any rhonchi, wheezes or dullness to percussion. Abdomin: Soft, nontender without any shifting dullness or ascites. Musculoskeletal: No clubbing or cyanosis. Neurological:  No motor or sensory deficits. Skin: No rashes or lesions.  .                Lab Results: Lab Results  Component Value Date   WBC 8.5 12/06/2019   HGB 11.3 (L) 12/06/2019   HCT 30.6 (L) 12/06/2019   MCV 95.3 12/06/2019   PLT 315 12/06/2019     Chemistry      Component Value Date/Time   NA 141 12/06/2019 1032   NA 141 10/20/2019 0000   NA 138 07/03/2017 0813   K 4.3 12/06/2019 1032   K 4.2 07/03/2017 0813   CL 95 (L) 12/06/2019 1032   CO2 31 12/06/2019 1032   CO2 27 07/03/2017 0813   BUN 32 (H) 12/06/2019 1032  BUN 60 (A) 11/16/2019 0000   BUN 46.4 (H) 07/03/2017 0813   CREATININE 8.18 (HH) 12/06/2019 1032   CREATININE 10.9 (HH) 07/03/2017 0813   GLU 127 10/20/2019 0000      Component Value Date/Time   CALCIUM 10.0 12/06/2019 1032   CALCIUM 9.3 07/03/2017 0813   ALKPHOS 112 12/06/2019 1032   ALKPHOS 77 07/03/2017 0813   AST 11 (L) 12/06/2019 1032   AST 16 07/03/2017 0813   ALT 9 12/06/2019 1032   ALT 14 07/03/2017 0813   BILITOT 1.3 (H) 12/06/2019 1032   BILITOT 0.93 07/03/2017 0813      Results for DUVALL, COMES (MRN [id number]) as of 12/13/2019 10:44  Ref. Range 10/18/2019 10:02 11/15/2019 08:56  M Protein SerPl Elph-Mcnc Latest Ref Range: Not Observed g/dL 0.2 (H) 0.2 (H)      Impression and Plan:  54 year old man with:  1.  Multiple myeloma diagnosed in 2019.  He was found to have IgG kappa arising from MGUS with 20% plasma cell infiltration.  He has tolerated the current therapy without any major complaints.  Risks and benefits of continuing this regimen were reviewed.  He is agreeable to continue at this time.  Protein studies from November 15, 2019 were reiterated today continues to show very small M spike of 0.2 g/dL.  The plan for high-dose chemotherapy and stem cell transplant future upon his recovery.   2. Anemia: Related to renal insufficiency and plasma cell disorder.  Hemoglobin is adequate at this time.   2.  Antiemetics: No nausea  or vomiting reported.  Antiemetics are available to him.  4.  Renal failure: Not related to plasma cell disorder and currently on dialysis.  5.  Weakness and debilitation: Improved and continues to ambulate better at this time.  6.  Goals of care: Therapy remains aggressive at this time although his disease might not be curable.  7.  Hypotension: Improved at this time.  This is related to his fluid status and antihypertensive medication.  8. Follow-up: He will return for weekly Velcade and every other week daratumumab which will change to every 3 weeks and subsequently every 4 weeks.  He will have MD follow-up in 4 weeks.  30 minutes were dedicated to this visit.  The time was spent on reviewing his laboratory data, updating his disease status and future plan of care discussion.     Zola Button, MD 4/6/202110:43 AM

## 2019-12-13 NOTE — Progress Notes (Signed)
Cardiology Office Note:    Date:  12/13/2019   ID:  [place], DOB [birth year], MRN [id number]  PCP:  [name], MD  Cardiologist:  Sherren Mocha, MD  Electrophysiologist:  None  Oncologist: Dr. Alen Blew Neurologist: Dr. Delice Lesch  Referring MD: [name], MD   Chief Complaint:  Follow-up (PFO)    Patient Profile:    Isaiah [name] is a 54 y.o. male with:   Patent Foramen Ovale   Atrial fibrillation  ESRD on dialysis   Hx of DVT/pulmonary embolism   S/p IVC filter in 2012  Chronic GI blood loss, anticoagulation DC'd  Hx of CVA in 09/2015 (in setting of DVT, PE)  Admx in [place]; c/b GI bleed, pancreatitis, AKI  Sickle cell trait  Diabetes mellitus   Hypertension   OSA   Multiple Myeloma  Admx to [facility] 05/2019 for stem cell Tx; aborted due to Proteus bacteremia, asp pneumonia, a/c R subdural hematoma   Pulmonary hypertension   RHC at [facility] 05/2019 (Dr. Lora Paula) - mild elevation of filling pressures in setting of high cardiac output in setting of AVF, PA pressures appropriate to high flow state  Seizure d/o  Prior CV studies:  Echocardiogram 05/12/2019 ([facility]) EF 50, diastolic dysfunction, normal RVSF, Trivial MR, Trivial PI, mild TR  R cardiac catheterization 04/21/2019 ([facility]) RA 10, RV 67/9, mPA 38, PCWP 19, CO 9.6, CI 4.5, PVR 2.0.  These findings are consistent with mild elevation in intracardiac filling pressures in the setting of high cardiac output in the setting of AVF. PA pressures are appropriate to high flow state (normal range PVR).  Echocardiogram 03/31/2019 ([facility]) EF 50, diastolic dysfunction, NL RVSF, trivial MR/PI, mild TR, RVSP 56  Myoview 04/02/18 ([facility]) Normal perfusion, EF 55  Myoview 04/21/17 EF 56, no ischemia  Echocardiogram 04/21/17 EF 60-65, no RWMA, trivial TR   History of Present Illness:    Mr. Czajkowski was last seen by Dr. Burt Knack in 01/2017 for his Patent Foramen Ovale.  A TEE in 11/2016 demonstrated a very small Patent  Foramen Ovale with few bubbles across during Valsalva.  It was felt there would not be much benefit to closing it at that time and the decision was made to continue medical Rx.  He has not been seen since.  He was evaluated at [facility] by cardiology prior to an attempted stem cell transplant.  He had a right heart cath to evaluate pulmonary hypertension.  Right heart catheterization in August 2020 demonstrated findings consistent with mild elevation in intracardiac filling pressures in the setting of high cardiac output in the setting of AV fistula.  The patient stem cell transplant was aborted due to Proteus bacteremia, aspiration pneumonia and acute on chronic right subdural hematoma.  He returns for follow-up.  He is here with his wife.  He has not had chest discomfort, significant shortness of breath, orthopnea, or lower extremity swelling or syncope.  He goes to dialysis Monday Wednesday Friday.  He notes no difficulty getting to his dry weight.  Past Medical History:  Diagnosis Date  . A-fib ([place])   . Anemia   . Asthma   . DM type 2 (diabetes mellitus, type 2) ([place]) 06/09/2019  . ESRD (end stage renal disease) on dialysis ([place])   . Essential hypertension 06/09/2019  . GIB (gastrointestinal bleeding)    Recurrent episodes- 09/2014, 09/2015 and 05/2016  . Gout   . History of recent blood transfusion 10/27/14   2 Units PRBC's  . Hyperkalemia 07/2011  .  Multiple myeloma ([place])   . OSA on CPAP   . Pulmonary embolism ([place]) 07/2011; 09/27/2014   a. Bilat PE 07/2011 - unclear cause, tx with 6 months Coumadin.;   . Seizure disorder ([place]) 06/09/2019  . Sepsis ([place])   . Sickle cell-thalassemia disease ([place])    a. Sickle cell trait.  . Sleep apnea   . Stroke ([place]) 09/2015   R-MCA, L-MCA, PCA and bilateral cerebellar complicated by DVT/PE  . Subdural hematoma (H[facility]) 05/2019    Current Medications: Current Meds  Medication Sig  . acetaminophen (TYLENOL) 325 MG tablet Take 1-2 tablets (325-650 mg  total) by mouth every 4 (four) hours as needed for mild pain.  [name] acyclovir (ZOVIRAX) 400 MG tablet TAKE 1 TABLET (400 MG TOTAL) BY MOUTH DAILY.  [name] albuterol (PROAIR HFA) 108 (90 Base) MCG/ACT inhaler Inhale two puffs every 4-6 hours if needed for cough or wheeze  . Alcohol Swabs (SM ALCOHOL PREP) 70 % PADS   . AURYXIA 1 GM 210 MG(Fe) tablet Take 210 mg by mouth 3 (three) times daily.  . calcitRIOL (ROCALTROL) 0.25 MCG capsule Take by mouth 3 (three) times a week.  . calcium carbonate (TUMS - DOSED IN MG ELEMENTAL CALCIUM) 500 MG chewable tablet Chew 2 tablets by mouth at bedtime.  . camphor-menthol (SARNA) lotion Apply 1 application topically every 8 (eight) hours as needed for itching.  . Cetirizine HCl 10 MG CAPS Take 1 capsule by mouth daily.  . cinacalcet (SENSIPAR) 60 MG tablet Take 60 mg by mouth every evening. ON HOLD FOR DIALYSIS PER WIFE  . clonazePAM (KLONOPIN) 0.5 MG tablet Take 2 tablets every Monday, Wednesday, and Friday with hemodialysis  . DARZALEX FASPRO 1800-30000 MG-UT/15ML SOLN Inject 1  . dexamethasone (DECADRON) 4 MG tablet Take 5 tablets (20 mg total) by mouth once a week. Take 5 tablets (20 mg total) by mouth once a week on day of chemo.  . fluticasone furoate-vilanterol (BREO ELLIPTA) 200-25 MCG/INH AEPB Inhale 1 puff into the lungs daily.  [name] FREESTYLE LITE test strip   . hydrOXYzine (ATARAX/VISTARIL) 25 MG tablet Take 1 tablet (25 mg total) by mouth every 8 (eight) hours as needed for itching.  . Insulin Glargine (BASAGLAR KWIKPEN) 100 UNIT/ML SMARTSIG:6 Unit(s) SUB-Q Every Morning  . Insulin Lispro (HUMALOG KWIKPEN [place]) Inject 2-4 Units into the skin 3 (three) times daily before meals. Sliding scale: 150-250 =2 units, 250 -350=3 units, 350 += 4 units (On Hold)  . ketorolac (ACULAR) 0.5 % ophthalmic solution Place 1 drop into both eyes 4 (four) times daily.  [name] lacosamide (VIMPAT) 200 MG TABS tablet Take 1 tablet twice a day except on dialysis days M-W-F give 1 tab in AM, 1  tab after dialysis, 1 tab in PM.  . lamoTRIgine (LAMICTAL) 200 MG tablet Take 1 tablet (200 mg total) by mouth 2 (two) times daily.  . Lancets (FREESTYLE) lancets   . Methoxy PEG-Epoetin Beta (MIRCERA IJ) Mircera  . midodrine (PROAMATINE) 5 MG tablet Take one tablet by mouth 30 minutes before dialysis.  [name] multivitamin (RENA-VIT) TABS tablet Take 1 tablet by mouth at bedtime.  . Nutritional Supplements (FEEDING SUPPLEMENT, NEPRO CARB STEADY,) LIQD Take 237 mLs by mouth 3 (three) times daily as needed (Supplement).  . pantoprazole (PROTONIX) 40 MG tablet TAKE 1 TABLET BY MOUTH ONCE DAILY  . prochlorperazine (COMPAZINE) 10 MG tablet TAKE 1 TABLET (10 MG TOTAL) BY MOUTH EVERY 6 (SIX) HOURS AS NEEDED FOR NAUSEA OR VOMITING.  [name] UNIFINE PENTIPS  31G X 8 MM MISC USE AS DIRECTED DAILY AFTER SUPPER     Allergies:   Codeine and Codeine   Social History   Tobacco Use  . Smoking status: Never Smoker  . Smokeless tobacco: Never Used  Substance Use Topics  . Alcohol use: Not Currently  . Drug use: Never     Family Hx: The patient's family history includes Diabetes in his mother; Diabetes type I in his brother; Hypertension in his father and mother; Kidney disease in his brother; Sickle cell anemia in his brother and brother; Stroke in his father.  ROS   EKGs/Labs/Other Test Reviewed:    EKG:  EKG is   ordered today.  The ekg ordered today demonstrates normal sinus rhythm, heart rate 87, normal axis, nonspecific ST-T wave changes, QTC 476  Recent Labs: 12/13/2019: ALT 9; BUN 36; Creatinine 8.19; Hemoglobin 10.5; Platelet Count 264; Potassium 4.5; Sodium 142   Recent Lipid Panel Lab Results  Component Value Date/Time   CHOL 169 03/17/2019 12:39 PM   TRIG 91 03/17/2019 12:39 PM   HDL 62 03/17/2019 12:39 PM   CHOLHDL 2.7 03/17/2019 12:39 PM   CHOLHDL 2.7 10/24/2015 12:10 PM   LDLCALC 89 03/17/2019 12:39 PM    Physical Exam:    VS:  BP 122/70   Pulse 87   Ht 5' 10" (1.778 m)   Wt 184 lb  (83.5 kg)   SpO2 97%   BMI 26.40 kg/m     Wt Readings from Last 3 Encounters:  12/13/19 184 lb (83.5 kg)  12/13/19 183 lb 9.6 oz (83.3 kg)  12/08/19 183 lb (83 kg)     Constitutional:      Appearance: Healthy appearance. Not in distress.  Neck:     Vascular: JVD normal.  Pulmonary:     Breath sounds: No wheezing. No rales.  Cardiovascular:     Normal rate. Regular rhythm. Normal S1. Normal S2.     Murmurs: There is no murmur.  Edema:    Peripheral edema absent.  Abdominal:     Palpations: Abdomen is soft. There is no hepatomegaly.  Skin:    General: Skin is warm and dry.  Neurological:     General: No focal deficit present.     Mental Status: Alert and oriented to person, place and time.       ASSESSMENT & PLAN:    1. Patent foramen ovale He had a small PFO by TEE in 2018.  The decision at that time was to not pursue closure as there would not be much benefit given the increased risk of the procedure.  I will review further with Dr. Burt Knack to determine if we need to obtain a follow-up echocardiogram with bubble study.  Follow-up with Dr. Burt Knack in 1 year.  2. Paroxysmal atrial fibrillation (HCC) Patient is in sinus rhythm today.  His notes indicate he has a history of atrial fibrillation.  It is not clear to me when this occurred.  However, he is not a candidate for anticoagulation given his history of gastrointestinal bleeding as well as recurrent subdural hematoma.  Right heart cath  3. Pulmonary hypertension, unspecified ([place]) Right heart catheterization in September 2020 was consistent with high cardiac output in the setting of AV fistula.  This was done at [facility] prior to his attempted stem cell transplant.  4. ESRD on dialysis [facility]) He remains on Monday, Wednesday, Friday hemodialysis.  He is followed by Dr. Jimmy Footman.   5. Multiple myeloma, remission status unspecified (  [place]) He continues follow-up with oncology (Dr. Alen Blew).  He notes there will be another attempt  at stem cell transplant once he is in remission.   Dispo:  Return in about 1 year (around 12/12/2020) for Routine Follow Up, w/ Dr. Burt Knack, in person.   Medication Adjustments/Labs and Tests Ordered: Current medicines are reviewed at length with the patient today.  Concerns regarding medicines are outlined above.  Tests Ordered: Orders Placed This Encounter  Procedures  . EKG 12-Lead   Medication Changes: No orders of the defined types were placed in this encounter.   Signed, Richardson Dopp, PA-C  12/13/2019 5:46 PM    [facility] [address], [place], [place]  [address] Phone: [phone]; Fax: [phone]

## 2019-12-13 NOTE — Patient Instructions (Signed)
Medication Instructions:   Your physician recommends that you continue on your current medications as directed. Please refer to the Current Medication list given to you today.  *If you need a refill on your cardiac medications before your next appointment, please call your pharmacy*  Lab Work:  None ordered today  Testing/Procedures:  None ordered today  Follow-Up: At [name], you and your health needs are our priority.  As part of our continuing mission to provide you with exceptional heart care, we have created designated Provider Care Teams.  These Care Teams include your primary Cardiologist (physician) and Advanced Practice Providers (APPs -  Physician Assistants and Nurse Practitioners) who all work together to provide you with the care you need, when you need it.  We recommend signing up for the patient portal called "MyChart".  Sign up information is provided on this After Visit Summary.  MyChart is used to connect with patients for Virtual Visits (Telemedicine).  Patients are able to view lab/test results, encounter notes, upcoming appointments, etc.  Non-urgent messages can be sent to your provider as well.   To learn more about what you can do with MyChart, go to [email or web address].    Your next appointment:   12 month(s)  The format for your next appointment:   In Person  Provider:   Sherren Mocha, MD

## 2019-12-13 NOTE — Telephone Encounter (Signed)
Close encounter 

## 2019-12-13 NOTE — Patient Instructions (Signed)
[facility] Discharge Instructions for Patients Receiving Chemotherapy  Today you received the following chemotherapy agents: bortezomib.  To help prevent nausea and vomiting after your treatment, we encourage you to take your nausea medication as directed.   If you develop nausea and vomiting that is not controlled by your nausea medication, call the clinic.   BELOW ARE SYMPTOMS THAT SHOULD BE REPORTED IMMEDIATELY:  *FEVER GREATER THAN 100.5 F  *CHILLS WITH OR WITHOUT FEVER  NAUSEA AND VOMITING THAT IS NOT CONTROLLED WITH YOUR NAUSEA MEDICATION  *UNUSUAL SHORTNESS OF BREATH  *UNUSUAL BRUISING OR BLEEDING  TENDERNESS IN MOUTH AND THROAT WITH OR WITHOUT PRESENCE OF ULCERS  *URINARY PROBLEMS  *BOWEL PROBLEMS  UNUSUAL RASH Items with * indicate a potential emergency and should be followed up as soon as possible.  Feel free to call the clinic should you have any questions or concerns. The clinic phone number is [phone].  Please show the [place] at check-in to the Emergency Department and triage nurse.

## 2019-12-14 ENCOUNTER — Telehealth: Payer: Self-pay | Admitting: Oncology

## 2019-12-14 DIAGNOSIS — Z23 Encounter for immunization: Secondary | ICD-10-CM | POA: Diagnosis not present

## 2019-12-14 DIAGNOSIS — E1129 Type 2 diabetes mellitus with other diabetic kidney complication: Secondary | ICD-10-CM | POA: Diagnosis not present

## 2019-12-14 DIAGNOSIS — D631 Anemia in chronic kidney disease: Secondary | ICD-10-CM | POA: Diagnosis not present

## 2019-12-14 DIAGNOSIS — Z992 Dependence on renal dialysis: Secondary | ICD-10-CM | POA: Diagnosis not present

## 2019-12-14 DIAGNOSIS — N2581 Secondary hyperparathyroidism of renal origin: Secondary | ICD-10-CM | POA: Diagnosis not present

## 2019-12-14 DIAGNOSIS — D509 Iron deficiency anemia, unspecified: Secondary | ICD-10-CM | POA: Diagnosis not present

## 2019-12-14 DIAGNOSIS — N186 End stage renal disease: Secondary | ICD-10-CM | POA: Diagnosis not present

## 2019-12-14 LAB — KAPPA/LAMBDA LIGHT CHAINS
Kappa free light chain: 109.4 mg/L — ABNORMAL HIGH (ref 3.3–19.4)
Kappa, lambda light chain ratio: 2.71 — ABNORMAL HIGH (ref 0.26–1.65)
Lambda free light chains: 40.3 mg/L — ABNORMAL HIGH (ref 5.7–26.3)

## 2019-12-14 NOTE — Telephone Encounter (Signed)
Scheduled appt per 4/6 los

## 2019-12-14 NOTE — Progress Notes (Signed)
Pharmacist Chemotherapy Monitoring - Follow Up Assessment    I verify that I have reviewed each item in the below checklist:  . Regimen for the patient is scheduled for the appropriate day and plan matches scheduled date. [name] Appropriate non-routine labs are ordered dependent on drug ordered. . If applicable, additional medications reviewed and ordered per protocol based on lifetime cumulative doses and/or treatment regimen.   Plan for follow-up and/or issues identified: No . I-vent associated with next due treatment: No . MD and/or nursing notified: No  Devrin Monforte D 12/14/2019 11:41 AM

## 2019-12-15 DIAGNOSIS — C9000 Multiple myeloma not having achieved remission: Secondary | ICD-10-CM | POA: Diagnosis not present

## 2019-12-15 DIAGNOSIS — I69351 Hemiplegia and hemiparesis following cerebral infarction affecting right dominant side: Secondary | ICD-10-CM | POA: Diagnosis not present

## 2019-12-15 DIAGNOSIS — I69320 Aphasia following cerebral infarction: Secondary | ICD-10-CM | POA: Diagnosis not present

## 2019-12-15 DIAGNOSIS — I120 Hypertensive chronic kidney disease with stage 5 chronic kidney disease or end stage renal disease: Secondary | ICD-10-CM | POA: Diagnosis not present

## 2019-12-15 DIAGNOSIS — E1122 Type 2 diabetes mellitus with diabetic chronic kidney disease: Secondary | ICD-10-CM | POA: Diagnosis not present

## 2019-12-15 DIAGNOSIS — N186 End stage renal disease: Secondary | ICD-10-CM | POA: Diagnosis not present

## 2019-12-16 DIAGNOSIS — E109 Type 1 diabetes mellitus without complications: Secondary | ICD-10-CM | POA: Diagnosis not present

## 2019-12-16 DIAGNOSIS — N2581 Secondary hyperparathyroidism of renal origin: Secondary | ICD-10-CM | POA: Diagnosis not present

## 2019-12-16 DIAGNOSIS — N186 End stage renal disease: Secondary | ICD-10-CM | POA: Diagnosis not present

## 2019-12-16 DIAGNOSIS — E1129 Type 2 diabetes mellitus with other diabetic kidney complication: Secondary | ICD-10-CM | POA: Diagnosis not present

## 2019-12-16 DIAGNOSIS — Z992 Dependence on renal dialysis: Secondary | ICD-10-CM | POA: Diagnosis not present

## 2019-12-16 DIAGNOSIS — D509 Iron deficiency anemia, unspecified: Secondary | ICD-10-CM | POA: Diagnosis not present

## 2019-12-16 DIAGNOSIS — Z23 Encounter for immunization: Secondary | ICD-10-CM | POA: Diagnosis not present

## 2019-12-16 DIAGNOSIS — D631 Anemia in chronic kidney disease: Secondary | ICD-10-CM | POA: Diagnosis not present

## 2019-12-16 LAB — MULTIPLE MYELOMA PANEL, SERUM
Albumin SerPl Elph-Mcnc: 3.8 g/dL (ref 2.9–4.4)
Albumin/Glob SerPl: 1.6 (ref 0.7–1.7)
Alpha 1: 0.3 g/dL (ref 0.0–0.4)
Alpha2 Glob SerPl Elph-Mcnc: 0.7 g/dL (ref 0.4–1.0)
B-Globulin SerPl Elph-Mcnc: 0.8 g/dL (ref 0.7–1.3)
Gamma Glob SerPl Elph-Mcnc: 0.6 g/dL (ref 0.4–1.8)
Globulin, Total: 2.4 g/dL (ref 2.2–3.9)
IgA: 22 mg/dL — ABNORMAL LOW (ref 90–386)
IgG (Immunoglobin G), Serum: 647 mg/dL (ref 603–1613)
IgM (Immunoglobulin M), Srm: 17 mg/dL — ABNORMAL LOW (ref 20–172)
M Protein SerPl Elph-Mcnc: 0.2 g/dL — ABNORMAL HIGH
Total Protein ELP: 6.2 g/dL (ref 6.0–8.5)

## 2019-12-19 ENCOUNTER — Other Ambulatory Visit: Payer: Self-pay | Admitting: Oncology

## 2019-12-19 DIAGNOSIS — D509 Iron deficiency anemia, unspecified: Secondary | ICD-10-CM | POA: Diagnosis not present

## 2019-12-19 DIAGNOSIS — D631 Anemia in chronic kidney disease: Secondary | ICD-10-CM | POA: Diagnosis not present

## 2019-12-19 DIAGNOSIS — N2581 Secondary hyperparathyroidism of renal origin: Secondary | ICD-10-CM | POA: Diagnosis not present

## 2019-12-19 DIAGNOSIS — Z23 Encounter for immunization: Secondary | ICD-10-CM | POA: Diagnosis not present

## 2019-12-19 DIAGNOSIS — N186 End stage renal disease: Secondary | ICD-10-CM | POA: Diagnosis not present

## 2019-12-19 DIAGNOSIS — E1129 Type 2 diabetes mellitus with other diabetic kidney complication: Secondary | ICD-10-CM | POA: Diagnosis not present

## 2019-12-19 DIAGNOSIS — Z992 Dependence on renal dialysis: Secondary | ICD-10-CM | POA: Diagnosis not present

## 2019-12-20 ENCOUNTER — Telehealth: Payer: Self-pay

## 2019-12-20 ENCOUNTER — Other Ambulatory Visit: Payer: Self-pay

## 2019-12-20 ENCOUNTER — Inpatient Hospital Stay: Payer: Medicare Other

## 2019-12-20 VITALS — BP 119/65 | HR 84 | Temp 98.5°F | Resp 17 | Wt 177.5 lb

## 2019-12-20 DIAGNOSIS — C9000 Multiple myeloma not having achieved remission: Secondary | ICD-10-CM

## 2019-12-20 DIAGNOSIS — Z5112 Encounter for antineoplastic immunotherapy: Secondary | ICD-10-CM | POA: Diagnosis not present

## 2019-12-20 LAB — CMP (CANCER CENTER ONLY)
ALT: 19 U/L (ref 0–44)
AST: 15 U/L (ref 15–41)
Albumin: 4.3 g/dL (ref 3.5–5.0)
Alkaline Phosphatase: 129 U/L — ABNORMAL HIGH (ref 38–126)
Anion gap: 15 (ref 5–15)
BUN: 31 mg/dL — ABNORMAL HIGH (ref 6–20)
CO2: 32 mmol/L (ref 22–32)
Calcium: 9.7 mg/dL (ref 8.9–10.3)
Chloride: 96 mmol/L — ABNORMAL LOW (ref 98–111)
Creatinine: 7.75 mg/dL (ref 0.61–1.24)
GFR, Est AFR Am: 8 mL/min — ABNORMAL LOW (ref >60)
GFR, Estimated: 7 mL/min — ABNORMAL LOW (ref >60)
Glucose, Bld: 197 mg/dL — ABNORMAL HIGH (ref 70–99)
Potassium: 4.7 mmol/L (ref 3.5–5.1)
Sodium: 143 mmol/L (ref 135–145)
Total Bilirubin: 1 mg/dL (ref 0.3–1.2)
Total Protein: 7.3 g/dL (ref 6.5–8.1)

## 2019-12-20 LAB — CBC WITH DIFFERENTIAL (CANCER CENTER ONLY)
Abs Immature Granulocytes: 0.02 10*3/uL (ref 0.00–0.07)
Basophils Absolute: 0 10*3/uL (ref 0.0–0.1)
Basophils Relative: 1 %
Eosinophils Absolute: 0.2 10*3/uL (ref 0.0–0.5)
Eosinophils Relative: 2 %
HCT: 32.2 % — ABNORMAL LOW (ref 39.0–52.0)
Hemoglobin: 12 g/dL — ABNORMAL LOW (ref 13.0–17.0)
Immature Granulocytes: 0 %
Lymphocytes Relative: 3 %
Lymphs Abs: 0.2 10*3/uL — ABNORMAL LOW (ref 0.7–4.0)
MCH: 35.1 pg — ABNORMAL HIGH (ref 26.0–34.0)
MCHC: 37.3 g/dL — ABNORMAL HIGH (ref 30.0–36.0)
MCV: 94.2 fL (ref 80.0–100.0)
Monocytes Absolute: 0.2 10*3/uL (ref 0.1–1.0)
Monocytes Relative: 2 %
Neutro Abs: 7.2 10*3/uL (ref 1.7–7.7)
Neutrophils Relative %: 92 %
Platelet Count: 275 10*3/uL (ref 150–400)
RBC: 3.42 MIL/uL — ABNORMAL LOW (ref 4.22–5.81)
RDW: 14.2 % (ref 11.5–15.5)
WBC Count: 7.8 10*3/uL (ref 4.0–10.5)
nRBC: 19.5 % — ABNORMAL HIGH (ref 0.0–0.2)

## 2019-12-20 MED ORDER — PROCHLORPERAZINE MALEATE 10 MG PO TABS
10.0000 mg | ORAL_TABLET | Freq: Once | ORAL | Status: AC
  Administered 2019-12-20: 12:00:00 10 mg via ORAL

## 2019-12-20 MED ORDER — DIPHENHYDRAMINE HCL 25 MG PO CAPS
ORAL_CAPSULE | ORAL | Status: AC
  Filled 2019-12-20: qty 2

## 2019-12-20 MED ORDER — PROCHLORPERAZINE MALEATE 10 MG PO TABS
ORAL_TABLET | ORAL | Status: AC
  Filled 2019-12-20: qty 1

## 2019-12-20 MED ORDER — ACETAMINOPHEN 325 MG PO TABS
650.0000 mg | ORAL_TABLET | Freq: Once | ORAL | Status: AC
  Administered 2019-12-20: 650 mg via ORAL

## 2019-12-20 MED ORDER — DIPHENHYDRAMINE HCL 25 MG PO CAPS
50.0000 mg | ORAL_CAPSULE | Freq: Once | ORAL | Status: AC
  Administered 2019-12-20: 50 mg via ORAL

## 2019-12-20 MED ORDER — BORTEZOMIB CHEMO SQ INJECTION 3.5 MG (2.5MG/ML)
1.5000 mg/m2 | Freq: Once | INTRAMUSCULAR | Status: AC
  Administered 2019-12-20: 3 mg via SUBCUTANEOUS
  Filled 2019-12-20: qty 1.2

## 2019-12-20 MED ORDER — DARATUMUMAB-HYALURONIDASE-FIHJ 1800-30000 MG-UT/15ML ~~LOC~~ SOLN
1800.0000 mg | Freq: Once | SUBCUTANEOUS | Status: AC
  Administered 2019-12-20: 1800 mg via SUBCUTANEOUS
  Filled 2019-12-20: qty 15

## 2019-12-20 MED ORDER — ACETAMINOPHEN 325 MG PO TABS
ORAL_TABLET | ORAL | Status: AC
  Filled 2019-12-20: qty 2

## 2019-12-20 MED ORDER — SODIUM CHLORIDE 0.9% FLUSH
10.0000 mL | INTRAVENOUS | Status: DC | PRN
  Filled 2019-12-20: qty 10

## 2019-12-20 MED ORDER — HEPARIN SOD (PORK) LOCK FLUSH 100 UNIT/ML IV SOLN
500.0000 [IU] | Freq: Once | INTRAVENOUS | Status: DC | PRN
  Filled 2019-12-20: qty 5

## 2019-12-20 MED ORDER — SODIUM CHLORIDE 0.9 % IV SOLN
Freq: Once | INTRAVENOUS | Status: DC
  Filled 2019-12-20: qty 250

## 2019-12-20 NOTE — Progress Notes (Signed)
Patient weight today of 177 lbs concerned patient of weight loss. Patient is dialysis patient so discussed dry weight. His wife obtained the dry weight of 81.5kg which is 179 lbs. Patient made aware.

## 2019-12-20 NOTE — Patient Instructions (Signed)
Isa[facility] [name] Discharge Instructions for Patients Receiving Chemotherapy  Today you received the following chemotherapy agents: Velcade, Darzalex Faspro  To help prevent nausea and vomiting after your treatment, we encourage you to take your nausea medication as directed.   If you develop nausea and vomiting that is not controlled by your nausea medication, call the clinic.   BELOW ARE SYMPTOMS THAT SHOULD BE REPORTED IMMEDIATELY:  *FEVER GREATER THAN 100.5 F  *CHILLS WITH OR WITHOUT FEVER  NAUSEA AND VOMITING THAT IS NOT CONTROLLED WITH YOUR NAUSEA MEDICATION  *UNUSUAL SHORTNESS OF BREATH  *UNUSUAL BRUISING OR BLEEDING  TENDERNESS IN MOUTH AND THROAT WITH OR WITHOUT PRESENCE OF ULCERS  *URINARY PROBLEMS  *BOWEL PROBLEMS  UNUSUAL RASH Items with * indicate a potential emergency and should be followed up as soon as possible.  Feel free to call the clinic should you have any questions or concerns. The clinic phone number is [phone].  Please show the [facility] at check-in to the Emergency Department and triage nurse.

## 2019-12-20 NOTE — Telephone Encounter (Signed)
Critical Value: Creatinine 7.75 Wendall Mola, Rn notified

## 2019-12-21 DIAGNOSIS — N2581 Secondary hyperparathyroidism of renal origin: Secondary | ICD-10-CM | POA: Diagnosis not present

## 2019-12-21 DIAGNOSIS — D631 Anemia in chronic kidney disease: Secondary | ICD-10-CM | POA: Diagnosis not present

## 2019-12-21 DIAGNOSIS — E1129 Type 2 diabetes mellitus with other diabetic kidney complication: Secondary | ICD-10-CM | POA: Diagnosis not present

## 2019-12-21 DIAGNOSIS — Z992 Dependence on renal dialysis: Secondary | ICD-10-CM | POA: Diagnosis not present

## 2019-12-21 DIAGNOSIS — D509 Iron deficiency anemia, unspecified: Secondary | ICD-10-CM | POA: Diagnosis not present

## 2019-12-21 DIAGNOSIS — Z23 Encounter for immunization: Secondary | ICD-10-CM | POA: Diagnosis not present

## 2019-12-21 DIAGNOSIS — N186 End stage renal disease: Secondary | ICD-10-CM | POA: Diagnosis not present

## 2019-12-21 LAB — CBC AND DIFFERENTIAL
HCT: 33 — AB (ref 41–53)
Hemoglobin: 11.2 — AB (ref 13.5–17.5)
Platelets: 251 (ref 150–399)
WBC: 13.9

## 2019-12-21 LAB — CBC: RBC: 3.18 — AB (ref 3.87–5.11)

## 2019-12-21 LAB — BASIC METABOLIC PANEL
BUN: 58 — AB (ref 4–21)
CO2: 30 — AB (ref 13–22)
Chloride: 100 (ref 99–108)
Creatinine: 9.7 — AB (ref 0.6–1.3)
Glucose: 197
Potassium: 4.2 (ref 3.4–5.3)
Sodium: 145 (ref 137–147)

## 2019-12-21 LAB — HEMOGLOBIN A1C: Hemoglobin A1C: 5.1

## 2019-12-21 MED FILL — BREO ELLIPTA 200-25 MCG INH: 200-25 | 30 days supply | Qty: 60 | Fill #2

## 2019-12-21 MED FILL — SUBVENITE 200 MG TABS: 200 | 30 days supply | Qty: 60 | Fill #2

## 2019-12-21 NOTE — Progress Notes (Signed)
Pharmacist Chemotherapy Monitoring - Follow Up Assessment    I verify that I have reviewed each item in the below checklist:  . Regimen for the patient is scheduled for the appropriate day and plan matches scheduled date. [name] Appropriate non-routine labs are ordered dependent on drug ordered. . If applicable, additional medications reviewed and ordered per protocol based on lifetime cumulative doses and/or treatment regimen.   Plan for follow-up and/or issues identified: No . I-vent associated with next due treatment: No . MD and/or nursing notified: No  Isaiah [name] D 12/21/2019 1:41 PM

## 2019-12-22 DIAGNOSIS — I120 Hypertensive chronic kidney disease with stage 5 chronic kidney disease or end stage renal disease: Secondary | ICD-10-CM | POA: Diagnosis not present

## 2019-12-22 DIAGNOSIS — I69351 Hemiplegia and hemiparesis following cerebral infarction affecting right dominant side: Secondary | ICD-10-CM | POA: Diagnosis not present

## 2019-12-22 DIAGNOSIS — I69320 Aphasia following cerebral infarction: Secondary | ICD-10-CM | POA: Diagnosis not present

## 2019-12-22 DIAGNOSIS — N186 End stage renal disease: Secondary | ICD-10-CM | POA: Diagnosis not present

## 2019-12-22 DIAGNOSIS — E1122 Type 2 diabetes mellitus with diabetic chronic kidney disease: Secondary | ICD-10-CM | POA: Diagnosis not present

## 2019-12-22 DIAGNOSIS — C9000 Multiple myeloma not having achieved remission: Secondary | ICD-10-CM | POA: Diagnosis not present

## 2019-12-23 DIAGNOSIS — D509 Iron deficiency anemia, unspecified: Secondary | ICD-10-CM | POA: Diagnosis not present

## 2019-12-23 DIAGNOSIS — D631 Anemia in chronic kidney disease: Secondary | ICD-10-CM | POA: Diagnosis not present

## 2019-12-23 DIAGNOSIS — Z992 Dependence on renal dialysis: Secondary | ICD-10-CM | POA: Diagnosis not present

## 2019-12-23 DIAGNOSIS — N186 End stage renal disease: Secondary | ICD-10-CM | POA: Diagnosis not present

## 2019-12-23 DIAGNOSIS — N2581 Secondary hyperparathyroidism of renal origin: Secondary | ICD-10-CM | POA: Diagnosis not present

## 2019-12-23 DIAGNOSIS — Z23 Encounter for immunization: Secondary | ICD-10-CM | POA: Diagnosis not present

## 2019-12-23 DIAGNOSIS — E1129 Type 2 diabetes mellitus with other diabetic kidney complication: Secondary | ICD-10-CM | POA: Diagnosis not present

## 2019-12-23 LAB — COMPREHENSIVE METABOLIC PANEL
Albumin: 4.4 (ref 3.5–5.0)
Calcium: 9.7 (ref 8.7–10.7)

## 2019-12-26 ENCOUNTER — Telehealth: Payer: Self-pay | Admitting: *Deleted

## 2019-12-26 DIAGNOSIS — N186 End stage renal disease: Secondary | ICD-10-CM | POA: Diagnosis not present

## 2019-12-26 DIAGNOSIS — D631 Anemia in chronic kidney disease: Secondary | ICD-10-CM | POA: Diagnosis not present

## 2019-12-26 DIAGNOSIS — E1129 Type 2 diabetes mellitus with other diabetic kidney complication: Secondary | ICD-10-CM | POA: Diagnosis not present

## 2019-12-26 DIAGNOSIS — Z992 Dependence on renal dialysis: Secondary | ICD-10-CM | POA: Diagnosis not present

## 2019-12-26 DIAGNOSIS — N2581 Secondary hyperparathyroidism of renal origin: Secondary | ICD-10-CM | POA: Diagnosis not present

## 2019-12-26 DIAGNOSIS — D509 Iron deficiency anemia, unspecified: Secondary | ICD-10-CM | POA: Diagnosis not present

## 2019-12-26 DIAGNOSIS — Z23 Encounter for immunization: Secondary | ICD-10-CM | POA: Diagnosis not present

## 2019-12-26 NOTE — Telephone Encounter (Signed)
Hand written Rx faxed to [facility] for transport chair

## 2019-12-27 ENCOUNTER — Inpatient Hospital Stay: Payer: Medicare Other

## 2019-12-27 ENCOUNTER — Ambulatory Visit: Payer: Self-pay

## 2019-12-27 ENCOUNTER — Inpatient Hospital Stay (HOSPITAL_BASED_OUTPATIENT_CLINIC_OR_DEPARTMENT_OTHER): Payer: Medicare Other | Admitting: Oncology

## 2019-12-27 ENCOUNTER — Other Ambulatory Visit: Payer: Self-pay

## 2019-12-27 VITALS — BP 121/49 | HR 81 | Temp 98.2°F | Resp 18 | Ht 70.0 in | Wt 182.3 lb

## 2019-12-27 DIAGNOSIS — C9000 Multiple myeloma not having achieved remission: Secondary | ICD-10-CM

## 2019-12-27 DIAGNOSIS — I69320 Aphasia following cerebral infarction: Secondary | ICD-10-CM | POA: Diagnosis not present

## 2019-12-27 DIAGNOSIS — I120 Hypertensive chronic kidney disease with stage 5 chronic kidney disease or end stage renal disease: Secondary | ICD-10-CM | POA: Diagnosis not present

## 2019-12-27 DIAGNOSIS — E1122 Type 2 diabetes mellitus with diabetic chronic kidney disease: Secondary | ICD-10-CM

## 2019-12-27 DIAGNOSIS — N186 End stage renal disease: Secondary | ICD-10-CM | POA: Diagnosis not present

## 2019-12-27 DIAGNOSIS — I69351 Hemiplegia and hemiparesis following cerebral infarction affecting right dominant side: Secondary | ICD-10-CM | POA: Diagnosis not present

## 2019-12-27 DIAGNOSIS — Z5112 Encounter for antineoplastic immunotherapy: Secondary | ICD-10-CM | POA: Diagnosis not present

## 2019-12-27 LAB — CBC WITH DIFFERENTIAL (CANCER CENTER ONLY)
Abs Immature Granulocytes: 0.02 10*3/uL (ref 0.00–0.07)
Basophils Absolute: 0.1 10*3/uL (ref 0.0–0.1)
Basophils Relative: 1 %
Eosinophils Absolute: 0.9 10*3/uL — ABNORMAL HIGH (ref 0.0–0.5)
Eosinophils Relative: 12 %
HCT: 31.2 % — ABNORMAL LOW (ref 39.0–52.0)
Hemoglobin: 11.6 g/dL — ABNORMAL LOW (ref 13.0–17.0)
Immature Granulocytes: 0 %
Lymphocytes Relative: 0 %
Lymphs Abs: 0 10*3/uL — ABNORMAL LOW (ref 0.7–4.0)
MCH: 34.7 pg — ABNORMAL HIGH (ref 26.0–34.0)
MCHC: 37.2 g/dL — ABNORMAL HIGH (ref 30.0–36.0)
MCV: 93.4 fL (ref 80.0–100.0)
Monocytes Absolute: 1.4 10*3/uL — ABNORMAL HIGH (ref 0.1–1.0)
Monocytes Relative: 18 %
Neutro Abs: 5.6 10*3/uL (ref 1.7–7.7)
Neutrophils Relative %: 69 %
Platelet Count: 227 10*3/uL (ref 150–400)
RBC: 3.34 MIL/uL — ABNORMAL LOW (ref 4.22–5.81)
RDW: 14.4 % (ref 11.5–15.5)
WBC Count: 8 10*3/uL (ref 4.0–10.5)
nRBC: 13.3 % — ABNORMAL HIGH (ref 0.0–0.2)

## 2019-12-27 LAB — CMP (CANCER CENTER ONLY)
ALT: 19 U/L (ref 0–44)
AST: 15 U/L (ref 15–41)
Albumin: 3.8 g/dL (ref 3.5–5.0)
Alkaline Phosphatase: 123 U/L (ref 38–126)
Anion gap: 18 — ABNORMAL HIGH (ref 5–15)
BUN: 29 mg/dL — ABNORMAL HIGH (ref 6–20)
CO2: 30 mmol/L (ref 22–32)
Calcium: 9.3 mg/dL (ref 8.9–10.3)
Chloride: 97 mmol/L — ABNORMAL LOW (ref 98–111)
Creatinine: 7.94 mg/dL (ref 0.61–1.24)
GFR, Est AFR Am: 8 mL/min — ABNORMAL LOW (ref >60)
GFR, Estimated: 7 mL/min — ABNORMAL LOW (ref >60)
Glucose, Bld: 154 mg/dL — ABNORMAL HIGH (ref 70–99)
Potassium: 4.8 mmol/L (ref 3.5–5.1)
Sodium: 145 mmol/L (ref 135–145)
Total Bilirubin: 0.8 mg/dL (ref 0.3–1.2)
Total Protein: 6.5 g/dL (ref 6.5–8.1)

## 2019-12-27 MED ORDER — PROCHLORPERAZINE MALEATE 10 MG PO TABS
10.0000 mg | ORAL_TABLET | Freq: Once | ORAL | Status: AC
  Administered 2019-12-27: 10 mg via ORAL

## 2019-12-27 MED ORDER — PROCHLORPERAZINE MALEATE 10 MG PO TABS
ORAL_TABLET | ORAL | Status: AC
  Filled 2019-12-27: qty 1

## 2019-12-27 MED ORDER — BORTEZOMIB CHEMO SQ INJECTION 3.5 MG (2.5MG/ML)
1.5000 mg/m2 | Freq: Once | INTRAMUSCULAR | Status: AC
  Administered 2019-12-27: 3 mg via SUBCUTANEOUS
  Filled 2019-12-27: qty 1.2

## 2019-12-27 NOTE — Chronic Care Management (AMB) (Signed)
Chronic Care Management    Social Work Follow Up Note  12/27/2019 Name: Isaiah [name] [facility] MRN: [id number] DOB: March 30, 1966  Isaiah [name] is a 54 y.o. year old male who is a primary care patient of Isaiah Chard, MD. The CCM team was consulted for assistance with care coordination.   Review of patient status, including review of consultants reports, other relevant assessments, and collaboration with appropriate care team members and the patient's provider was performed as part of comprehensive patient evaluation and provision of chronic care management services.    SDOH (Social Determinants of Health) assessments performed: No    Outpatient Encounter Medications as of 12/27/2019  Medication Sig Note  . acetaminophen (TYLENOL) 325 MG tablet Take 1-2 tablets (325-650 mg total) by mouth every 4 (four) hours as needed for mild pain.   [name] acyclovir (ZOVIRAX) 400 MG tablet TAKE 1 TABLET (400 MG TOTAL) BY MOUTH DAILY.   [name] albuterol (PROAIR HFA) 108 (90 Base) MCG/ACT inhaler Inhale two puffs every 4-6 hours if needed for cough or wheeze   . Alcohol Swabs (SM ALCOHOL PREP) 70 % PADS    . AURYXIA 1 GM 210 MG(Fe) tablet Take 210 mg by mouth 3 (three) times daily.   . calcitRIOL (ROCALTROL) 0.25 MCG capsule Take by mouth 3 (three) times a week.   . calcium carbonate (TUMS - DOSED IN MG ELEMENTAL CALCIUM) 500 MG chewable tablet Chew 2 tablets by mouth at bedtime. 10/04/2019: On hold per MD  . camphor-menthol (SARNA) lotion Apply 1 application topically every 8 (eight) hours as needed for itching.   . Cetirizine HCl 10 MG CAPS Take 1 capsule by mouth daily.   . cinacalcet (SENSIPAR) 60 MG tablet Take 60 mg by mouth every evening. ON HOLD FOR DIALYSIS PER WIFE   . clonazePAM (KLONOPIN) 0.5 MG tablet Take 2 tablets every Monday, Wednesday, and Friday with hemodialysis   . DARZALEX FASPRO 1800-30000 MG-UT/15ML SOLN Inject 1   . dexamethasone (DECADRON) 4 MG tablet Take 5 tablets (20 mg total) by mouth once a  week. Take 5 tablets (20 mg total) by mouth once a week on day of chemo.   . fluticasone furoate-vilanterol (BREO ELLIPTA) 200-25 MCG/INH AEPB Inhale 1 puff into the lungs daily.   [name] FREESTYLE LITE test strip    . hydrOXYzine (ATARAX/VISTARIL) 25 MG tablet Take 1 tablet (25 mg total) by mouth every 8 (eight) hours as needed for itching.   . Insulin Glargine (BASAGLAR KWIKPEN) 100 UNIT/ML SMARTSIG:6 Unit(s) SUB-Q Every Morning   . Insulin Lispro (HUMALOG KWIKPEN [place]) Inject 2-4 Units into the skin 3 (three) times daily before meals. Sliding scale: 150-250 =2 units, 250 -350=3 units, 350 += 4 units (On Hold)   . ketorolac (ACULAR) 0.5 % ophthalmic solution Place 1 drop into both eyes 4 (four) times daily.   [name] lacosamide (VIMPAT) 200 MG TABS tablet Take 1 tablet twice a day except on dialysis days M-W-F give 1 tab in AM, 1 tab after dialysis, 1 tab in PM.   . lamoTRIgine (LAMICTAL) 200 MG tablet Take 1 tablet (200 mg total) by mouth 2 (two) times daily.   . Lancets (FREESTYLE) lancets    . Methoxy PEG-Epoetin Beta (MIRCERA IJ) Mircera   . midodrine (PROAMATINE) 5 MG tablet Take one tablet by mouth 30 minutes before dialysis.   [name] multivitamin (RENA-VIT) TABS tablet Take 1 tablet by mouth at bedtime.   . Nutritional Supplements (FEEDING SUPPLEMENT, NEPRO CARB STEADY,) LIQD Take 237 mLs by  mouth 3 (three) times daily as needed (Supplement).   . pantoprazole (PROTONIX) 40 MG tablet TAKE 1 TABLET BY MOUTH ONCE DAILY   . prochlorperazine (COMPAZINE) 10 MG tablet TAKE 1 TABLET (10 MG TOTAL) BY MOUTH EVERY 6 (SIX) HOURS AS NEEDED FOR NAUSEA OR VOMITING.   [name] UNIFINE PENTIPS 31G X 8 MM MISC USE AS DIRECTED DAILY AFTER SUPPER    No facility-administered encounter medications on file as of 12/27/2019.     Goals Addressed            This Visit's Progress   . Collaborate with RN Care Manager to perform apprpriate assessments to determine care management and care coordination needs       Current Barriers:    . Care management and care coordination needs related to DMII, ESRD, and multiple myeloma  which resulted in inpatient hospitalization . ADL IADL limitations  Clinical Social Work Clinical Goal(s):  [name] Over the next 60 days, patient will collaborate with care management team to assist with care coordination needs in response to recent inpatient hospitalization  CCM SW Interventions: Completed 12/27/2019 . Successful outbound call placed to [facility] in response to voice message received requesting return call . Determined Isaiah [name] has yet to hear if the patient will be approved to receive Medicaid benefits including CAP/DA services . Outbound call placed to [place] with caseworker Isaiah [name] who confirms the patient Medicaid application is still pending o Confirmed receipt of application on March 96EA via online portal o Discussed it may take up to 45 business days for patient to receive approval or denial letter . Communication with [name] to relay above information . Encouraged Isaiah [name] to contact care management team as needed prior to next planned outreach call  Patient Self Care Activities:  . Attends all scheduled provider appointments . Calls pharmacy for medication refills . Calls provider office for new concerns or questions . Supportive wife and caregiver to assist with patient care needs   Please see past updates related to this goal by clicking on the "Past Updates" button in the selected goal        Follow Up Plan: SW will follow up with patient by phone over the next month.   [name], BSW, CDP Social Worker, Certified Dementia Practitioner [place] [facility] [place] [facility] [phone]  Total time spent performing care coordination and/or care management activities with the patient by phone or face to face = 10 minutes.

## 2019-12-27 NOTE — Patient Instructions (Signed)
[facility] Discharge Instructions for Patients Receiving Chemotherapy  Today you received the following chemotherapy agent: Bortezomib (Velcade)  To help prevent nausea and vomiting after your treatment, we encourage you to take your nausea medication as directed by your MD.   If you develop nausea and vomiting that is not controlled by your nausea medication, call the clinic.   BELOW ARE SYMPTOMS THAT SHOULD BE REPORTED IMMEDIATELY:  *FEVER GREATER THAN 100.5 F  *CHILLS WITH OR WITHOUT FEVER  NAUSEA AND VOMITING THAT IS NOT CONTROLLED WITH YOUR NAUSEA MEDICATION  *UNUSUAL SHORTNESS OF BREATH  *UNUSUAL BRUISING OR BLEEDING  TENDERNESS IN MOUTH AND THROAT WITH OR WITHOUT PRESENCE OF ULCERS  *URINARY PROBLEMS  *BOWEL PROBLEMS  UNUSUAL RASH Items with * indicate a potential emergency and should be followed up as soon as possible.  Feel free to call the clinic should you have any questions or concerns. The clinic phone number is [phone].  Please show the [place] at check-in to the Emergency Department and triage nurse.  Coronavirus (COVID-19) Are you at risk?  Are you at risk for the Coronavirus (COVID-19)?  To be considered HIGH RISK for Coronavirus (COVID-19), you have to meet the following criteria:  . Traveled to [place], [place], [place], [place] or [place]; or in the [place] to [place], [place], [place], or [place]; and have fever, cough, and shortness of breath within the last 2 weeks of travel OR . Been in close contact with a person diagnosed with COVID-19 within the last 2 weeks and have fever, cough, and shortness of breath . IF YOU DO NOT MEET THESE CRITERIA, YOU ARE CONSIDERED LOW RISK FOR COVID-19.  What to do if you are HIGH RISK for COVID-19?  [name] If you are having a medical emergency, call 911. . Seek medical care right away. Before you go to a doctor's office, urgent care or emergency department, call ahead and tell  them about your recent travel, contact with someone diagnosed with COVID-19, and your symptoms. You should receive instructions from your physician's office regarding next steps of care.  . When you arrive at healthcare provider, tell the healthcare staff immediately you have returned from visiting [place], [place], [place], [place] or [place]; or traveled in the [place] to [place], [place], [place], or [place]; in the last two weeks or you have been in close contact with a person diagnosed with COVID-19 in the last 2 weeks.   . Tell the health care staff about your symptoms: fever, cough and shortness of breath. . After you have been seen by a medical provider, you will be either: o Tested for (COVID-19) and discharged home on quarantine except to seek medical care if symptoms worsen, and asked to  - Stay home and avoid contact with others until you get your results (4-5 days)  - Avoid travel on public transportation if possible (such as bus, train, or airplane) or o Sent to the Emergency Department by EMS for evaluation, COVID-19 testing, and possible admission depending on your condition and test results.  What to do if you are LOW RISK for COVID-19?  Reduce your risk of any infection by using the same precautions used for avoiding the common cold or flu:  [name] Wash your hands often with soap and warm water for at least 20 seconds.  If soap and water are not readily available, use an alcohol-based hand sanitizer with at least 60% alcohol.  [name]  If coughing or sneezing, cover your mouth and nose by coughing or sneezing into the elbow areas of your shirt or coat, into a tissue or into your sleeve (not your hands). . Avoid shaking hands with others and consider head nods or verbal greetings only. . Avoid touching your eyes, nose, or mouth with unwashed hands.  . Avoid close contact with people who are sick. . Avoid places or events with large numbers of people in one location, like concerts or  sporting events. . Carefully consider travel plans you have or are making. . If you are planning any travel outside or inside the [place], visit the CDC's Travelers' Health webpage for the latest health notices. . If you have some symptoms but not all symptoms, continue to monitor at home and seek medical attention if your symptoms worsen. . If you are having a medical emergency, call 911.   [place] / e-Visit: [email or web address]         [facility]: [facility]: [phone]                   [facility]: [phone]

## 2019-12-27 NOTE — Progress Notes (Signed)
Hematology and Oncology Follow Up Visit  Isaiah [name] [id number] Aug 22, 1966 54 y.o. 12/27/2019 8:49 AM    Principle Diagnosis: 54 year old man with IgG kappa multiple myeloma diagnosed in 2019.     Past therapy: Velcade dexamethasone and cyclophosphamide weekly started on 05/25/2018.  Velcade is given at 1.5 mg/m weekly, dexamethasone is 20 mg weekly and and Cytoxan is given at 650 mg total dose.  Last treatment was given in August 2020.    Therapy was held today in anticipation of high-dose chemotherapy and autologous stem cell transplant.  He did not receive stem cell transplant due to subdural hematoma and sepsis in September 2020.  Current therapy:  Velcade at 1.5 mg per metered squared subcutaneously weekly, dexamethasone 20 mg weekly, daratumumab 1800 mg subcutaneous injection every 2 weeks.  Cycle 1 started on 08/30/2019. He is here for cycle 17 of therapy.  Interim History:  [name] presents today for repeat evaluation.  Since the last visit, he reports no major changes in his health.  He continues to tolerate current therapy without any complications.  He denies any nausea, vomiting or abdominal pain.  He denies any excessive fatigue or tiredness.  His performance status is improving and his mobility has picked up.  He is able to ambulate with help of a walker without any recent falls or syncope.                       Medications: Updated today.  Current Outpatient Medications:  .  acetaminophen (TYLENOL) 325 MG tablet, Take 1-2 tablets (325-650 mg total) by mouth every 4 (four) hours as needed for mild pain., Disp:  , Rfl:  .  acyclovir (ZOVIRAX) 400 MG tablet, TAKE 1 TABLET (400 MG TOTAL) BY MOUTH DAILY., Disp: 90 tablet, Rfl: 0 .  albuterol (PROAIR HFA) 108 (90 Base) MCG/ACT inhaler, Inhale two puffs every 4-6 hours if needed for cough or wheeze, Disp: 1 Inhaler, Rfl: 1 .  Alcohol Swabs (SM ALCOHOL PREP) 70 % PADS, , Disp: , Rfl:  .  AURYXIA 1 GM 210  MG(Fe) tablet, Take 210 mg by mouth 3 (three) times daily., Disp: , Rfl:  .  calcitRIOL (ROCALTROL) 0.25 MCG capsule, Take by mouth 3 (three) times a week., Disp: , Rfl:  .  calcium carbonate (TUMS - DOSED IN MG ELEMENTAL CALCIUM) 500 MG chewable tablet, Chew 2 tablets by mouth at bedtime., Disp: , Rfl:  .  camphor-menthol (SARNA) lotion, Apply 1 application topically every 8 (eight) hours as needed for itching., Disp: 222 mL, Rfl: 0 .  Cetirizine HCl 10 MG CAPS, Take 1 capsule by mouth daily., Disp: , Rfl:  .  cinacalcet (SENSIPAR) 60 MG tablet, Take 60 mg by mouth every evening. ON HOLD FOR DIALYSIS PER WIFE, Disp: , Rfl:  .  clonazePAM (KLONOPIN) 0.5 MG tablet, Take 2 tablets every Monday, Wednesday, and Friday with hemodialysis, Disp: 30 tablet, Rfl: 5 .  DARZALEX FASPRO 1800-30000 MG-UT/15ML SOLN, Inject 1, Disp: , Rfl:  .  dexamethasone (DECADRON) 4 MG tablet, Take 5 tablets (20 mg total) by mouth once a week. Take 5 tablets (20 mg total) by mouth once a week on day of chemo., Disp: 40 tablet, Rfl: 3 .  fluticasone furoate-vilanterol (BREO ELLIPTA) 200-25 MCG/INH AEPB, Inhale 1 puff into the lungs daily., Disp: , Rfl:  .  FREESTYLE LITE test strip, , Disp: , Rfl:  .  hydrOXYzine (ATARAX/VISTARIL) 25 MG tablet, Take 1 tablet (25 mg total) by  mouth every 8 (eight) hours as needed for itching., Disp: 30 tablet, Rfl: 0 .  Insulin Glargine (BASAGLAR KWIKPEN) 100 UNIT/ML, SMARTSIG:6 Unit(s) SUB-Q Every Morning, Disp: , Rfl:  .  Insulin Lispro (HUMALOG KWIKPEN [place]), Inject 2-4 Units into the skin 3 (three) times daily before meals. Sliding scale: 150-250 =2 units, 250 -350=3 units, 350 += 4 units (On Hold), Disp: , Rfl:  .  ketorolac (ACULAR) 0.5 % ophthalmic solution, Place 1 drop into both eyes 4 (four) times daily., Disp: , Rfl:  .  lacosamide (VIMPAT) 200 MG TABS tablet, Take 1 tablet twice a day except on dialysis days [name]-F give 1 tab in AM, 1 tab after dialysis, 1 tab in PM., Disp: 90 tablet,  Rfl: 5 .  lamoTRIgine (LAMICTAL) 200 MG tablet, Take 1 tablet (200 mg total) by mouth 2 (two) times daily., Disp: 60 tablet, Rfl: 11 .  Lancets (FREESTYLE) lancets, , Disp: , Rfl:  .  Methoxy PEG-Epoetin Beta (MIRCERA IJ), Mircera, Disp: , Rfl:  .  midodrine (PROAMATINE) 5 MG tablet, Take one tablet by mouth 30 minutes before dialysis., Disp: 30 tablet, Rfl: 1 .  multivitamin (RENA-VIT) TABS tablet, Take 1 tablet by mouth at bedtime., Disp: , Rfl:  .  Nutritional Supplements (FEEDING SUPPLEMENT, NEPRO CARB STEADY,) LIQD, Take 237 mLs by mouth 3 (three) times daily as needed (Supplement)., Disp: 237 mL, Rfl: 12 .  pantoprazole (PROTONIX) 40 MG tablet, TAKE 1 TABLET BY MOUTH ONCE DAILY, Disp: 90 tablet, Rfl: 1 .  prochlorperazine (COMPAZINE) 10 MG tablet, TAKE 1 TABLET (10 MG TOTAL) BY MOUTH EVERY 6 (SIX) HOURS AS NEEDED FOR NAUSEA OR VOMITING., Disp: 30 tablet, Rfl: 0 .  UNIFINE PENTIPS 31G X 8 MM MISC, USE AS DIRECTED DAILY AFTER SUPPER, Disp: 100 each, Rfl: 3  Allergies:  Allergies  Allergen Reactions  . Codeine Swelling  . Codeine Rash and Other (See Comments)    Unknown reaction (patient says it was more serious than just a rash, but he can't remember what happened) Unknown reaction (patient says it was more serious than just a rash, but he can't remember what happened)      Physical Exam:    Blood pressure (!) 121/49, pulse 81, temperature 98.2 F (36.8 C), temperature source Temporal, resp. rate 18, height 5' 10" (1.778 m), weight 182 lb 4.8 oz (82.7 kg), SpO2 100 %.      ECOG: 1    General appearance: Comfortable appearing without any discomfort Head: Normocephalic without any trauma Oropharynx: Mucous membranes are moist and pink without any thrush or ulcers. Eyes: Pupils are equal and round reactive to light. Lymph nodes: No cervical, supraclavicular, inguinal or axillary lymphadenopathy.   Heart:regular rate and rhythm.  S1 and S2 without leg edema. Lung: Clear  without any rhonchi or wheezes.  No dullness to percussion. Abdomin: Soft, nontender, nondistended with good bowel sounds.  No hepatosplenomegaly. Musculoskeletal: No joint deformity or effusion.  Full range of motion noted. Neurological: No deficits noted on motor, sensory and deep tendon reflex exam. Skin: No petechial rash or dryness.  Appeared moist.                  Lab Results: Lab Results  Component Value Date   WBC 7.8 12/20/2019   HGB 12.0 (L) 12/20/2019   HCT 32.2 (L) 12/20/2019   MCV 94.2 12/20/2019   PLT 275 12/20/2019     Chemistry      Component Value Date/Time   NA 143 12/20/2019   1017   NA 141 10/20/2019 0000   NA 138 07/03/2017 0813   K 4.7 12/20/2019 1017   K 4.2 07/03/2017 0813   CL 96 (L) 12/20/2019 1017   CO2 32 12/20/2019 1017   CO2 27 07/03/2017 0813   BUN 31 (H) 12/20/2019 1017   BUN 60 (A) 11/16/2019 0000   BUN 46.4 (H) 07/03/2017 0813   CREATININE 7.75 (HH) 12/20/2019 1017   CREATININE 10.9 (HH) 07/03/2017 0813   GLU 127 10/20/2019 0000      Component Value Date/Time   CALCIUM 9.7 12/20/2019 1017   CALCIUM 9.3 07/03/2017 0813   ALKPHOS 129 (H) 12/20/2019 1017   ALKPHOS 77 07/03/2017 0813   AST 15 12/20/2019 1017   AST 16 07/03/2017 0813   ALT 19 12/20/2019 1017   ALT 14 07/03/2017 0813   BILITOT 1.0 12/20/2019 1017   BILITOT 0.93 07/03/2017 0813       Results for Antolin, Isaiah [name] (MRN [id number]) as of 12/27/2019 08:50  Ref. Range 12/13/2019 10:21  M Protein SerPl Elph-Mcnc Latest Ref Range: Not Observed g/dL 0.2 (H)  IFE 1 Unknown Comment (A)  Globulin, Total Latest Ref Range: 2.2 - 3.9 g/dL 2.4  B-Globulin SerPl Elph-Mcnc Latest Ref Range: 0.7 - 1.3 g/dL 0.8  IgG (Immunoglobin G), Serum Latest Ref Range: 603 - 1,613 mg/dL 647  IgM (Immunoglobulin M), Srm Latest Ref Range: 20 - 172 mg/dL 17 (L)  IgA Latest Ref Range: 90 - 386 mg/dL 22 (L)      Impression and Plan:  54-year-old man with:  1.  IgG kappa multiple  myeloma arising from MGUS since 2019.    He is receiving salvage therapy outlined above without any major complications.  Risks and benefits of continuing this approach was discussed.  Protein studies were also reiterated indicating overall reasonable response to therapy.  His complications include nausea, vomiting and infusion related issues were reviewed.  He is agreeable to continue at this time and the goal of treatment to complete through the months of June and subsequently will be on maintenance on a monthly basis.   2. Anemia: Resolved at this time with a hemoglobin close to normal range.  His anemia is related to renal disease as well as plasma cell disorder.   2.  Antiemetics: Antiemetics are available to him without any recent nausea or vomiting.  4.  Renal failure: He continues to be dialysis dependent without any issues.   5.  Weakness and debilitation: He continues to improve his with physical therapy to increase in his ambulation.  6.  Goals of care: His disease remains incurable warranted given his reasonable performance status and age.  7.  Hypotension: Resolved with blood pressure back to normal range.  8. Follow-up: We will continue weekly Velcade, daratumumab schedule as well as MD follow-up 4 weeks.   30 minutes were spent on this encounter.  The time was spent on reviewing his laboratory data, disease status update and addressing complications related to his diagnosis and treatment.     , MD 4/20/20218:49 AM  

## 2019-12-27 NOTE — Patient Instructions (Addendum)
Social Worker Visit Information  Goals we discussed today:  Goals Addressed            This Visit's Progress   . Collaborate with RN Care Manager to perform apprpriate assessments to determine care management and care coordination needs       Current Barriers:  . Care management and care coordination needs related to DMII, ESRD, and multiple myeloma  which resulted in inpatient hospitalization . ADL IADL limitations  Clinical Social Work Clinical Goal(s):  [name] Over the next 60 days, patient will collaborate with care management team to assist with care coordination needs in response to recent inpatient hospitalization  CCM SW Interventions: Completed 12/27/2019 . Successful outbound call placed to [facility] in response to voice message received requesting return call . Determined Mrs. [facility] has yet to hear if the patient will be approved to receive Medicaid benefits including CAP/DA services . Outbound call placed to [place] with caseworker Shantice who confirms the patient Medicaid application is still pending o Confirmed receipt of application on March 90WI via online portal o Discussed it may take up to 45 business days for patient to receive approval or denial letter . Communication with [facility] to relay above information . Encouraged Mrs. Nerio to contact care management team as needed prior to next planned outreach call  Patient Self Care Activities:  . Attends all scheduled provider appointments . Calls pharmacy for medication refills . Calls provider office for new concerns or questions . Supportive wife and caregiver to assist with patient care needs   Please see past updates related to this goal by clicking on the "Past Updates" button in the selected goal        Follow Up Plan: SW will follow up with patient by phone over the next month. Please contact me as needed prior to next scheduled call.   [name], BSW, CDP Social Worker,  Certified Dementia Practitioner [place] / [place] [facility] [phone]

## 2019-12-28 DIAGNOSIS — D509 Iron deficiency anemia, unspecified: Secondary | ICD-10-CM | POA: Diagnosis not present

## 2019-12-28 DIAGNOSIS — N186 End stage renal disease: Secondary | ICD-10-CM | POA: Diagnosis not present

## 2019-12-28 DIAGNOSIS — D631 Anemia in chronic kidney disease: Secondary | ICD-10-CM | POA: Diagnosis not present

## 2019-12-28 DIAGNOSIS — N2581 Secondary hyperparathyroidism of renal origin: Secondary | ICD-10-CM | POA: Diagnosis not present

## 2019-12-28 DIAGNOSIS — Z23 Encounter for immunization: Secondary | ICD-10-CM | POA: Diagnosis not present

## 2019-12-28 DIAGNOSIS — E1129 Type 2 diabetes mellitus with other diabetic kidney complication: Secondary | ICD-10-CM | POA: Diagnosis not present

## 2019-12-28 DIAGNOSIS — Z992 Dependence on renal dialysis: Secondary | ICD-10-CM | POA: Diagnosis not present

## 2019-12-28 NOTE — Progress Notes (Signed)
Pharmacist Chemotherapy Monitoring - Follow Up Assessment    I verify that I have reviewed each item in the below checklist:  . Regimen for the patient is scheduled for the appropriate day and plan matches scheduled date. [name] Appropriate non-routine labs are ordered dependent on drug ordered. . If applicable, additional medications reviewed and ordered per protocol based on lifetime cumulative doses and/or treatment regimen.   Plan for follow-up and/or issues identified: No . I-vent associated with next due treatment: No . MD and/or nursing notified: No  Isaiah [name] [facility] 12/28/2019 9:42 AM

## 2019-12-29 DIAGNOSIS — E1122 Type 2 diabetes mellitus with diabetic chronic kidney disease: Secondary | ICD-10-CM | POA: Diagnosis not present

## 2019-12-29 DIAGNOSIS — I120 Hypertensive chronic kidney disease with stage 5 chronic kidney disease or end stage renal disease: Secondary | ICD-10-CM | POA: Diagnosis not present

## 2019-12-29 DIAGNOSIS — N186 End stage renal disease: Secondary | ICD-10-CM | POA: Diagnosis not present

## 2019-12-29 DIAGNOSIS — C9000 Multiple myeloma not having achieved remission: Secondary | ICD-10-CM | POA: Diagnosis not present

## 2019-12-29 DIAGNOSIS — I69351 Hemiplegia and hemiparesis following cerebral infarction affecting right dominant side: Secondary | ICD-10-CM | POA: Diagnosis not present

## 2019-12-29 DIAGNOSIS — I69320 Aphasia following cerebral infarction: Secondary | ICD-10-CM | POA: Diagnosis not present

## 2019-12-30 ENCOUNTER — Telehealth: Payer: Self-pay

## 2019-12-30 DIAGNOSIS — D631 Anemia in chronic kidney disease: Secondary | ICD-10-CM | POA: Diagnosis not present

## 2019-12-30 DIAGNOSIS — Z992 Dependence on renal dialysis: Secondary | ICD-10-CM | POA: Diagnosis not present

## 2019-12-30 DIAGNOSIS — E1129 Type 2 diabetes mellitus with other diabetic kidney complication: Secondary | ICD-10-CM | POA: Diagnosis not present

## 2019-12-30 DIAGNOSIS — D509 Iron deficiency anemia, unspecified: Secondary | ICD-10-CM | POA: Diagnosis not present

## 2019-12-30 DIAGNOSIS — N2581 Secondary hyperparathyroidism of renal origin: Secondary | ICD-10-CM | POA: Diagnosis not present

## 2019-12-30 DIAGNOSIS — N186 End stage renal disease: Secondary | ICD-10-CM | POA: Diagnosis not present

## 2019-12-30 DIAGNOSIS — Z23 Encounter for immunization: Secondary | ICD-10-CM | POA: Diagnosis not present

## 2020-01-01 MED FILL — AURYXIA 210 MG TABLET: 1 GM | 30 days supply | Qty: 90 | Fill #1

## 2020-01-02 DIAGNOSIS — D631 Anemia in chronic kidney disease: Secondary | ICD-10-CM | POA: Diagnosis not present

## 2020-01-02 DIAGNOSIS — N186 End stage renal disease: Secondary | ICD-10-CM | POA: Diagnosis not present

## 2020-01-02 DIAGNOSIS — N2581 Secondary hyperparathyroidism of renal origin: Secondary | ICD-10-CM | POA: Diagnosis not present

## 2020-01-02 DIAGNOSIS — E1129 Type 2 diabetes mellitus with other diabetic kidney complication: Secondary | ICD-10-CM | POA: Diagnosis not present

## 2020-01-02 DIAGNOSIS — Z992 Dependence on renal dialysis: Secondary | ICD-10-CM | POA: Diagnosis not present

## 2020-01-02 DIAGNOSIS — D509 Iron deficiency anemia, unspecified: Secondary | ICD-10-CM | POA: Diagnosis not present

## 2020-01-02 DIAGNOSIS — Z23 Encounter for immunization: Secondary | ICD-10-CM | POA: Diagnosis not present

## 2020-01-02 MED FILL — BASAGLAR 100 UNIT/ML KWIKPE: 100 | 28 days supply | Qty: 3 | Fill #1

## 2020-01-02 MED FILL — VIMPAT 200 MG TABLET: 200 | 30 days supply | Qty: 90 | Fill #4

## 2020-01-03 ENCOUNTER — Other Ambulatory Visit: Payer: Self-pay

## 2020-01-03 ENCOUNTER — Inpatient Hospital Stay: Payer: Medicare Other

## 2020-01-03 ENCOUNTER — Encounter: Payer: Self-pay | Admitting: *Deleted

## 2020-01-03 VITALS — BP 127/72 | HR 80 | Temp 97.6°F | Resp 18

## 2020-01-03 DIAGNOSIS — C9000 Multiple myeloma not having achieved remission: Secondary | ICD-10-CM | POA: Diagnosis not present

## 2020-01-03 DIAGNOSIS — Z5112 Encounter for antineoplastic immunotherapy: Secondary | ICD-10-CM | POA: Diagnosis not present

## 2020-01-03 LAB — CBC WITH DIFFERENTIAL (CANCER CENTER ONLY)
Abs Immature Granulocytes: 0.02 10*3/uL (ref 0.00–0.07)
Basophils Absolute: 0.1 10*3/uL (ref 0.0–0.1)
Basophils Relative: 1 %
Eosinophils Absolute: 0.2 10*3/uL (ref 0.0–0.5)
Eosinophils Relative: 3 %
HCT: 32 % — ABNORMAL LOW (ref 39.0–52.0)
Hemoglobin: 11.9 g/dL — ABNORMAL LOW (ref 13.0–17.0)
Immature Granulocytes: 0 %
Lymphocytes Relative: 0 %
Lymphs Abs: 0 10*3/uL — ABNORMAL LOW (ref 0.7–4.0)
MCH: 33.5 pg (ref 26.0–34.0)
MCHC: 37.2 g/dL — ABNORMAL HIGH (ref 30.0–36.0)
MCV: 90.1 fL (ref 80.0–100.0)
Monocytes Absolute: 0.2 10*3/uL (ref 0.1–1.0)
Monocytes Relative: 3 %
Neutro Abs: 7.6 10*3/uL (ref 1.7–7.7)
Neutrophils Relative %: 93 %
Platelet Count: 265 10*3/uL (ref 150–400)
RBC: 3.55 MIL/uL — ABNORMAL LOW (ref 4.22–5.81)
RDW: 14.5 % (ref 11.5–15.5)
WBC Count: 8.1 10*3/uL (ref 4.0–10.5)
nRBC: 8.4 % — ABNORMAL HIGH (ref 0.0–0.2)

## 2020-01-03 LAB — CMP (CANCER CENTER ONLY)
ALT: 16 U/L (ref 0–44)
AST: 14 U/L — ABNORMAL LOW (ref 15–41)
Albumin: 4 g/dL (ref 3.5–5.0)
Alkaline Phosphatase: 140 U/L — ABNORMAL HIGH (ref 38–126)
Anion gap: 14 (ref 5–15)
BUN: 28 mg/dL — ABNORMAL HIGH (ref 6–20)
CO2: 33 mmol/L — ABNORMAL HIGH (ref 22–32)
Calcium: 9.3 mg/dL (ref 8.9–10.3)
Chloride: 95 mmol/L — ABNORMAL LOW (ref 98–111)
Creatinine: 7.64 mg/dL (ref 0.61–1.24)
GFR, Est AFR Am: 8 mL/min — ABNORMAL LOW (ref >60)
GFR, Estimated: 7 mL/min — ABNORMAL LOW (ref >60)
Glucose, Bld: 138 mg/dL — ABNORMAL HIGH (ref 70–99)
Potassium: 4.5 mmol/L (ref 3.5–5.1)
Sodium: 142 mmol/L (ref 135–145)
Total Bilirubin: 1 mg/dL (ref 0.3–1.2)
Total Protein: 6.7 g/dL (ref 6.5–8.1)

## 2020-01-03 MED ORDER — ACETAMINOPHEN 325 MG PO TABS
ORAL_TABLET | ORAL | Status: AC
  Filled 2020-01-03: qty 2

## 2020-01-03 MED ORDER — PROCHLORPERAZINE MALEATE 10 MG PO TABS
ORAL_TABLET | ORAL | Status: AC
  Filled 2020-01-03: qty 1

## 2020-01-03 MED ORDER — DARATUMUMAB-HYALURONIDASE-FIHJ 1800-30000 MG-UT/15ML ~~LOC~~ SOLN
1800.0000 mg | Freq: Once | SUBCUTANEOUS | Status: AC
  Administered 2020-01-03: 12:00:00 1800 mg via SUBCUTANEOUS
  Filled 2020-01-03: qty 15

## 2020-01-03 MED ORDER — BORTEZOMIB CHEMO SQ INJECTION 3.5 MG (2.5MG/ML)
1.5000 mg/m2 | Freq: Once | INTRAMUSCULAR | Status: AC
  Administered 2020-01-03: 3 mg via SUBCUTANEOUS
  Filled 2020-01-03: qty 1.2

## 2020-01-03 MED ORDER — PROCHLORPERAZINE MALEATE 10 MG PO TABS
10.0000 mg | ORAL_TABLET | Freq: Once | ORAL | Status: AC
  Administered 2020-01-03: 11:00:00 10 mg via ORAL

## 2020-01-03 MED ORDER — ACETAMINOPHEN 325 MG PO TABS
650.0000 mg | ORAL_TABLET | Freq: Once | ORAL | Status: AC
  Administered 2020-01-03: 11:00:00 650 mg via ORAL

## 2020-01-03 MED ORDER — DIPHENHYDRAMINE HCL 25 MG PO CAPS
50.0000 mg | ORAL_CAPSULE | Freq: Once | ORAL | Status: AC
  Administered 2020-01-03: 50 mg via ORAL

## 2020-01-03 MED ORDER — DIPHENHYDRAMINE HCL 25 MG PO CAPS
ORAL_CAPSULE | ORAL | Status: AC
  Filled 2020-01-03: qty 2

## 2020-01-03 MED FILL — MIDODRINE HCL 5 MG TABS: 5 | 90 days supply | Qty: 39 | Fill #1

## 2020-01-03 NOTE — Progress Notes (Unsigned)
CRITICAL VALUE STICKER  CRITICAL VALUE: creatinine 7.64  RECEIVER (on-site recipient of call): Manuela Schwartz, [place] NOTIFIED: 01/03/20 @ 1102  MESSENGER (representative from lab):Jen  MD NOTIFIED: notified Natalie, [place]  RESPONSE:

## 2020-01-03 NOTE — Patient Instructions (Signed)
[facility] Discharge Instructions for Patients Receiving Chemotherapy  Today you received the following chemotherapy agents Bortezomib (VELCADE) & Daratumumab (DARZALEX FASPRO).  To help prevent nausea and vomiting after your treatment, we encourage you to take your nausea medication as prescribed.   If you develop nausea and vomiting that is not controlled by your nausea medication, call the clinic.   BELOW ARE SYMPTOMS THAT SHOULD BE REPORTED IMMEDIATELY:  *FEVER GREATER THAN 100.5 F  *CHILLS WITH OR WITHOUT FEVER  NAUSEA AND VOMITING THAT IS NOT CONTROLLED WITH YOUR NAUSEA MEDICATION  *UNUSUAL SHORTNESS OF BREATH  *UNUSUAL BRUISING OR BLEEDING  TENDERNESS IN MOUTH AND THROAT WITH OR WITHOUT PRESENCE OF ULCERS  *URINARY PROBLEMS  *BOWEL PROBLEMS  UNUSUAL RASH Items with * indicate a potential emergency and should be followed up as soon as possible.  Feel free to call the clinic should you have any questions or concerns. The clinic phone number is [phone].  Please show the [facility] at check-in to the Emergency Department and triage nurse.

## 2020-01-04 DIAGNOSIS — D631 Anemia in chronic kidney disease: Secondary | ICD-10-CM | POA: Diagnosis not present

## 2020-01-04 DIAGNOSIS — D509 Iron deficiency anemia, unspecified: Secondary | ICD-10-CM | POA: Diagnosis not present

## 2020-01-04 DIAGNOSIS — N186 End stage renal disease: Secondary | ICD-10-CM | POA: Diagnosis not present

## 2020-01-04 DIAGNOSIS — E1129 Type 2 diabetes mellitus with other diabetic kidney complication: Secondary | ICD-10-CM | POA: Diagnosis not present

## 2020-01-04 DIAGNOSIS — N2581 Secondary hyperparathyroidism of renal origin: Secondary | ICD-10-CM | POA: Diagnosis not present

## 2020-01-04 DIAGNOSIS — Z23 Encounter for immunization: Secondary | ICD-10-CM | POA: Diagnosis not present

## 2020-01-04 DIAGNOSIS — Z992 Dependence on renal dialysis: Secondary | ICD-10-CM | POA: Diagnosis not present

## 2020-01-04 NOTE — Progress Notes (Signed)
Pharmacist Chemotherapy Monitoring - Follow Up Assessment    I verify that I have reviewed each item in the below checklist:  . Regimen for the patient is scheduled for the appropriate day and plan matches scheduled date. [name] Appropriate non-routine labs are ordered dependent on drug ordered. . If applicable, additional medications reviewed and ordered per protocol based on lifetime cumulative doses and/or treatment regimen.   Plan for follow-up and/or issues identified: No . I-vent associated with next due treatment: Yes . MD and/or nursing notified: No  Adelina Mings 01/04/2020 2:37 PM

## 2020-01-05 ENCOUNTER — Ambulatory Visit: Payer: Medicare Other | Admitting: Physical Medicine & Rehabilitation

## 2020-01-05 NOTE — Progress Notes (Signed)
Virtual Visit via Video   This visit type was conducted due to national recommendations for restrictions regarding the COVID-19 Pandemic (e.g. social distancing) in an effort to limit this patient's exposure and mitigate transmission in our community.  Due to his co-morbid illnesses, this patient is at least at moderate risk for complications without adequate follow up.  This format is felt to be most appropriate for this patient at this time.  All issues noted in this document were discussed and addressed.  A limited physical exam was performed with this format.    This visit type was conducted due to national recommendations for restrictions regarding the COVID-19 Pandemic (e.g. social distancing) in an effort to limit this patient's exposure and mitigate transmission in our community.  Patients identity confirmed using two different identifiers.  This format is felt to be most appropriate for this patient at this time.  All issues noted in this document were discussed and addressed.  No physical exam was performed (except for noted visual exam findings with Video Visits).    Date:  01/05/2020   ID:  C[place]u [place], DOB 09/22/1965, MRN [id number]  Patient Location:  Home, accompanied by wife  Provider location:   Office   Chief Complaint:  "I have hospital f/u"  History of Present Illness:    Waylon Hershey [facility] is a 54 y.o. male who presents via video conferencing for a telehealth visit today.    The patient does not have symptoms concerning for COVID-19 infection (fever, chills, cough, or new shortness of breath).   He presents today for virtual visit. He prefers this method of contact due to COVID-19 pandemic. He presents today, accompanied by his wife, for a hospital f/u. He was admitted to [name] on 1/26 for further evaluation of a change in mental status. He apparently had an "episode" at home and his wife wanted him evaluated. He had been suffering from URI a day or two prior to  admission. Did not go to dialysis day before b/c he did not feel well. The following day he had slowed mentation.  Also, patient has periods of hypotension on dialysis days. ER evaluation included CT scan without any new changes. Nausea/vomiting thought to be due to metabolic encephalopathy due to missed dialysis. Hospital course significant for HD. EEG performed, slowing in left temporal region. Thought due to old CVAs.Hypotension also thought to be contributing to his decreased mentation. It was decided to d/c metoprolol along with amlodipine. It was also suggested to start midodrine on HD days. Nephrology will make final determination if this is started in the near future. He was discharged in stable condition on 1/29. He has felt okay since discharge.      Past Medical History:  Diagnosis Date  . A-fib ([place])   . Anemia   . Asthma   . DM type 2 (diabetes mellitus, type 2) ([place]) 06/09/2019  . ESRD (end stage renal disease) on dialysis ([place])   . Essential hypertension 06/09/2019  . GIB (gastrointestinal bleeding)    Recurrent episodes- 09/2014, 09/2015 and 05/2016  . Gout   . History of recent blood transfusion 10/27/14   2 Units PRBC's  . Hyperkalemia 07/2011  . Multiple myeloma ([place])   . OSA on CPAP   . Pulmonary embolism ([place]) 07/2011; 09/27/2014   a. Bilat PE 07/2011 - unclear cause, tx with 6 months Coumadin.;   . Seizure disorder ([place]) 06/09/2019  . Sepsis ([place])   . Sickle cell-thalassemia disease ([place])  a. Sickle cell trait.  . Sleep apnea   . Stroke ([place]) 09/2015   R-MCA, L-MCA, PCA and bilateral cerebellar complicated by DVT/PE  . Subdural hematoma ([place]) 05/2019   Past Surgical History:  Procedure Laterality Date  . AV FISTULA PLACEMENT Right 12/05/2015   Procedure: INSERTION OF ARTERIOVENOUS (AV) GORE-TEX GRAFT ARM;  Surgeon: Elam Dutch, MD;  Location: [facility];  Service: Vascular;  Laterality: Right;  . BONE MARROW BIOPSY    . CHOLECYSTECTOMY    . CHOLECYSTECTOMY   1990's?  . COLONOSCOPY WITH PROPOFOL N/A 01/22/2017   Procedure: COLONOSCOPY WITH PROPOFOL;  Surgeon: Wilford Corner, MD;  Location: [facility] ENDOSCOPY;  Service: Endoscopy;  Laterality: N/A;  . EYE SURGERY Right   . INSERTION OF DIALYSIS CATHETER Right 10/16/2015   Procedure: INSERTION OF PALINDROME DIALYSIS CATHETER ;  Surgeon: Elam Dutch, MD;  Location: [facility];  Service: Vascular;  Laterality: Right;  . OTHER SURGICAL HISTORY     Retinal surgery  . PARS PLANA VITRECTOMY  02/17/2012   Procedure: PARS PLANA VITRECTOMY WITH 25 GAUGE;  Surgeon: Hayden Pedro, MD;  Location: [facility];  Service: Ophthalmology;  Laterality: Right;  . PERIPHERAL VASCULAR CATHETERIZATION N/A 03/20/2016   Procedure: A/V Shuntogram/Fistulagram;  Surgeon: Conrad [place], MD;  Location: [facility];  Service: Cardiovascular;  Laterality: N/A;  . TEE WITHOUT CARDIOVERSION N/A 12/02/2016   Procedure: TRANSESOPHAGEAL ECHOCARDIOGRAM (TEE);  Surgeon: Larey Dresser, MD;  Location[facility]COPY;  Service: Cardiovascular;  Laterality: N/A;     Current Meds  Medication Sig  . acetaminophen (TYLENOL) 325 MG tablet Take 1-2 tablets (325-650 mg total) by mouth every 4 (four) hours as needed for mild pain.  [name] albuterol (PROAIR HFA) 108 (90 Base) MCG/ACT inhaler Inhale two puffs every 4-6 hours if needed for cough or wheeze  . camphor-menthol (SARNA) lotion Apply 1 application topically every 8 (eight) hours as needed for itching.  . Cetirizine HCl 10 MG CAPS Take 1 capsule by mouth daily.  . clonazePAM (KLONOPIN) 0.5 MG tablet Take 2 tablets every Monday, Wednesday, and Friday with hemodialysis  . dexamethasone (DECADRON) 4 MG tablet Take 5 tablets (20 mg total) by mouth once a week. Take 5 tablets (20 mg total) by mouth once a week on day of chemo.  . fluticasone furoate-vilanterol (BREO ELLIPTA) 200-25 MCG/INH AEPB Inhale 1 puff into the lungs daily.  . hydrOXYzine (ATARAX/VISTARIL) 25 MG tablet Take 1 tablet (25 mg total) by  mouth every 8 (eight) hours as needed for itching.  . Insulin Lispro (HUMALOG KWIKPEN [place]) Inject 2-4 Units into the skin 3 (three) times daily before meals. Sliding scale: 150-250 =2 units, 250 -350=3 units, 350 += 4 units (On Hold)  . ketorolac (ACULAR) 0.5 % ophthalmic solution Place 1 drop into both eyes 4 (four) times daily.  [name] lacosamide (VIMPAT) 200 MG TABS tablet Take 1 tablet twice a day except on dialysis days M-W-F give 1 tab in AM, 1 tab after dialysis, 1 tab in PM.  . multivitamin (RENA-VIT) TABS tablet Take 1 tablet by mouth at bedtime.  . prochlorperazine (COMPAZINE) 10 MG tablet TAKE 1 TABLET (10 MG TOTAL) BY MOUTH EVERY 6 (SIX) HOURS AS NEEDED FOR NAUSEA OR VOMITING.  . [DISCONTINUED] acyclovir (ZOVIRAX) 400 MG tablet Take 400 mg by mouth at bedtime.  . [DISCONTINUED] Daratumumab-Hyaluronidase-fihj (DARZALEX FASPRO [place]) Inject 1 Dose into the skin once a week. On Tuesday  . [DISCONTINUED] lamoTRIgine (LAMICTAL) 150 MG tablet Take 1 tablet (  150 mg total) by mouth 2 (two) times daily.  . [DISCONTINUED] pantoprazole (PROTONIX) 40 MG tablet Take 40 mg by mouth daily.  . [DISCONTINUED] prednisoLONE acetate (PRED FORTE) 1 % ophthalmic suspension Place 1 drop into the left eye daily.      Allergies:   Codeine and Codeine   Social History   Tobacco Use  . Smoking status: Never Smoker  . Smokeless tobacco: Never Used  Substance Use Topics  . Alcohol use: Not Currently  . Drug use: Never     Family Hx: The patient's family history includes Diabetes in his mother; Diabetes type I in his brother; Hypertension in his father and mother; Kidney disease in his brother; Sickle cell anemia in his brother and brother; Stroke in his father.  ROS:   Please see the history of present illness.    Review of Systems  Constitutional: Negative.   Respiratory: Negative.   Cardiovascular: Negative.   Gastrointestinal: Negative.   Neurological: Negative.   Psychiatric/Behavioral: Negative.      All other systems reviewed and are negative.   Labs/Other Tests and Data Reviewed:    Recent Labs: 01/03/2020: ALT 16; BUN 28; Creatinine 7.64; Hemoglobin 11.9; Platelet Count 265; Potassium 4.5; Sodium 142   Recent Lipid Panel Lab Results  Component Value Date/Time   CHOL 169 03/17/2019 12:39 PM   TRIG 91 03/17/2019 12:39 PM   HDL 62 03/17/2019 12:39 PM   CHOLHDL 2.7 03/17/2019 12:39 PM   CHOLHDL 2.7 10/24/2015 12:10 PM   LDLCALC 89 03/17/2019 12:39 PM    Wt Readings from Last 3 Encounters:  12/27/19 182 lb 4.8 oz (82.7 kg)  12/20/19 177 lb 8 oz (80.5 kg)  12/13/19 184 lb (83.5 kg)     Exam:    Vital Signs:  BP 127/80 Comment: pt provided  Pulse 88 Comment: pt provided  Temp (!) 97.3 F (36.3 C)   Ht 5' 10"  (1.778 m)   BMI 26.63 kg/m     Physical Exam  Constitutional: He is oriented to person, place, and time and well-developed, well-nourished, and in no distress.  HENT:  Head: Normocephalic and atraumatic.  Pulmonary/Chest: Effort normal.  Musculoskeletal:     Cervical back: Normal range of motion.  Neurological: He is alert and oriented to person, place, and time.  Psychiatric: Affect normal.  Nursing note and vitals reviewed.   ASSESSMENT & PLAN:     1. Encephalopathy, metabolic  DISCHARGE SUMMARY WAS REVIEWED IN FULL DETAIL DURING THE VISIT. MEDS RECONCILED AND COMPARED TO DISCHARGE MEDS. MEDICATION LIST WAS UPDATED AND REVIEWED WITH THE PATIENT. GREATER THAN 50% FACE TO FACE TIME WAS SPENT IN COUNSELING AND COORDINATION OF CARE. ALL QUESTIONS WERE ANSWERED TO THE SATISFACTION OF THE PATIENT.    2. SDH (subdural hematoma) (HCC)  Chronic, yet stable.   3. Multiple myeloma not having achieved remission (HCC)  Chronic. In 2018, bone marrow biopsy showed almost 20% plasma cell infiltration. In 2019, bone marrow bx w/  25% abnormal plasma cells. Delayed autologous stem cell transplant due to sepsis. He will continue treatment as per Hem/Onc.   4.  Seizure disorder (HCC)  Chronic. Initially triggered by his past CVA. He will continue with Vimpat and Lamictal.   5. Diabetes mellitus with end-stage renal disease (HCC)  Chronic, yet stable. Most recent hba1c is 5.1.     COVID-19 Education: The signs and symptoms of COVID-19 were discussed with the patient and how to seek care for testing (follow up with PCP or  arrange E-visit).  The importance of social distancing was discussed today.  Patient Risk:   After full review of this patients clinical status, I feel that they are at least moderate risk at this time.    Medication Adjustments/Labs and Tests Ordered: Current medicines are reviewed at length with the patient today.  Concerns regarding medicines are outlined above.   Tests Ordered: No orders of the defined types were placed in this encounter.   Medication Changes: No orders of the defined types were placed in this encounter.   Disposition:  Follow up prn  Signed, [name], MD

## 2020-01-06 DIAGNOSIS — Z992 Dependence on renal dialysis: Secondary | ICD-10-CM | POA: Diagnosis not present

## 2020-01-06 DIAGNOSIS — D631 Anemia in chronic kidney disease: Secondary | ICD-10-CM | POA: Diagnosis not present

## 2020-01-06 DIAGNOSIS — Z23 Encounter for immunization: Secondary | ICD-10-CM | POA: Diagnosis not present

## 2020-01-06 DIAGNOSIS — E1129 Type 2 diabetes mellitus with other diabetic kidney complication: Secondary | ICD-10-CM | POA: Diagnosis not present

## 2020-01-06 DIAGNOSIS — N186 End stage renal disease: Secondary | ICD-10-CM | POA: Diagnosis not present

## 2020-01-06 DIAGNOSIS — N2581 Secondary hyperparathyroidism of renal origin: Secondary | ICD-10-CM | POA: Diagnosis not present

## 2020-01-06 DIAGNOSIS — D509 Iron deficiency anemia, unspecified: Secondary | ICD-10-CM | POA: Diagnosis not present

## 2020-01-07 DIAGNOSIS — N186 End stage renal disease: Secondary | ICD-10-CM | POA: Diagnosis not present

## 2020-01-07 DIAGNOSIS — Z992 Dependence on renal dialysis: Secondary | ICD-10-CM | POA: Diagnosis not present

## 2020-01-07 DIAGNOSIS — E1122 Type 2 diabetes mellitus with diabetic chronic kidney disease: Secondary | ICD-10-CM | POA: Diagnosis not present

## 2020-01-09 DIAGNOSIS — D631 Anemia in chronic kidney disease: Secondary | ICD-10-CM | POA: Diagnosis not present

## 2020-01-09 DIAGNOSIS — Z992 Dependence on renal dialysis: Secondary | ICD-10-CM | POA: Diagnosis not present

## 2020-01-09 DIAGNOSIS — D509 Iron deficiency anemia, unspecified: Secondary | ICD-10-CM | POA: Diagnosis not present

## 2020-01-09 DIAGNOSIS — N2581 Secondary hyperparathyroidism of renal origin: Secondary | ICD-10-CM | POA: Diagnosis not present

## 2020-01-09 DIAGNOSIS — N186 End stage renal disease: Secondary | ICD-10-CM | POA: Diagnosis not present

## 2020-01-09 DIAGNOSIS — E1129 Type 2 diabetes mellitus with other diabetic kidney complication: Secondary | ICD-10-CM | POA: Diagnosis not present

## 2020-01-10 ENCOUNTER — Other Ambulatory Visit: Payer: Self-pay | Admitting: Oncology

## 2020-01-10 ENCOUNTER — Inpatient Hospital Stay: Payer: Medicare Other

## 2020-01-10 ENCOUNTER — Other Ambulatory Visit: Payer: Self-pay

## 2020-01-10 ENCOUNTER — Inpatient Hospital Stay: Payer: Medicare Other | Attending: Oncology

## 2020-01-10 ENCOUNTER — Telehealth: Payer: Self-pay

## 2020-01-10 VITALS — BP 137/51 | HR 73 | Temp 98.9°F | Resp 18 | Wt 182.5 lb

## 2020-01-10 DIAGNOSIS — R5381 Other malaise: Secondary | ICD-10-CM | POA: Diagnosis not present

## 2020-01-10 DIAGNOSIS — Z5112 Encounter for antineoplastic immunotherapy: Secondary | ICD-10-CM | POA: Diagnosis not present

## 2020-01-10 DIAGNOSIS — Z992 Dependence on renal dialysis: Secondary | ICD-10-CM | POA: Diagnosis not present

## 2020-01-10 DIAGNOSIS — C9000 Multiple myeloma not having achieved remission: Secondary | ICD-10-CM | POA: Diagnosis not present

## 2020-01-10 DIAGNOSIS — N19 Unspecified kidney failure: Secondary | ICD-10-CM | POA: Insufficient documentation

## 2020-01-10 DIAGNOSIS — R531 Weakness: Secondary | ICD-10-CM | POA: Diagnosis not present

## 2020-01-10 DIAGNOSIS — D6489 Other specified anemias: Secondary | ICD-10-CM | POA: Insufficient documentation

## 2020-01-10 LAB — CMP (CANCER CENTER ONLY)
ALT: 14 U/L (ref 0–44)
AST: 13 U/L — ABNORMAL LOW (ref 15–41)
Albumin: 4.1 g/dL (ref 3.5–5.0)
Alkaline Phosphatase: 150 U/L — ABNORMAL HIGH (ref 38–126)
Anion gap: 14 (ref 5–15)
BUN: 31 mg/dL — ABNORMAL HIGH (ref 6–20)
CO2: 33 mmol/L — ABNORMAL HIGH (ref 22–32)
Calcium: 9.2 mg/dL (ref 8.9–10.3)
Chloride: 98 mmol/L (ref 98–111)
Creatinine: 8.51 mg/dL (ref 0.61–1.24)
GFR, Est AFR Am: 7 mL/min — ABNORMAL LOW (ref >60)
GFR, Estimated: 6 mL/min — ABNORMAL LOW (ref >60)
Glucose, Bld: 131 mg/dL — ABNORMAL HIGH (ref 70–99)
Potassium: 4.8 mmol/L (ref 3.5–5.1)
Sodium: 145 mmol/L (ref 135–145)
Total Bilirubin: 0.8 mg/dL (ref 0.3–1.2)
Total Protein: 7 g/dL (ref 6.5–8.1)

## 2020-01-10 LAB — CBC WITH DIFFERENTIAL (CANCER CENTER ONLY)
Abs Immature Granulocytes: 0 10*3/uL (ref 0.00–0.07)
Basophils Absolute: 0.1 10*3/uL (ref 0.0–0.1)
Basophils Relative: 1 %
Eosinophils Absolute: 0.1 10*3/uL (ref 0.0–0.5)
Eosinophils Relative: 1 %
HCT: 32.7 % — ABNORMAL LOW (ref 39.0–52.0)
Hemoglobin: 12.1 g/dL — ABNORMAL LOW (ref 13.0–17.0)
Lymphocytes Relative: 2 %
Lymphs Abs: 0.2 10*3/uL — ABNORMAL LOW (ref 0.7–4.0)
MCH: 33.9 pg (ref 26.0–34.0)
MCHC: 37 g/dL — ABNORMAL HIGH (ref 30.0–36.0)
MCV: 91.6 fL (ref 80.0–100.0)
Monocytes Absolute: 0.1 10*3/uL (ref 0.1–1.0)
Monocytes Relative: 1 %
Neutro Abs: 8.3 10*3/uL — ABNORMAL HIGH (ref 1.7–7.7)
Neutrophils Relative %: 95 %
Platelet Count: 231 10*3/uL (ref 150–400)
RBC: 3.57 MIL/uL — ABNORMAL LOW (ref 4.22–5.81)
RDW: 15.2 % (ref 11.5–15.5)
WBC Count: 8.7 10*3/uL (ref 4.0–10.5)
nRBC: 14.8 % — ABNORMAL HIGH (ref 0.0–0.2)

## 2020-01-10 MED ORDER — BORTEZOMIB CHEMO SQ INJECTION 3.5 MG (2.5MG/ML)
1.5000 mg/m2 | Freq: Once | INTRAMUSCULAR | Status: AC
  Administered 2020-01-10: 3 mg via SUBCUTANEOUS
  Filled 2020-01-10: qty 1.2

## 2020-01-10 MED ORDER — PROCHLORPERAZINE MALEATE 10 MG PO TABS
10.0000 mg | ORAL_TABLET | Freq: Once | ORAL | Status: AC
  Administered 2020-01-10: 10 mg via ORAL

## 2020-01-10 MED ORDER — PROCHLORPERAZINE MALEATE 10 MG PO TABS
ORAL_TABLET | ORAL | Status: AC
  Filled 2020-01-10: qty 1

## 2020-01-10 MED FILL — DEXAMETHASONE 4 MG TABLET: 4 | 56 days supply | Qty: 40 | Fill #2

## 2020-01-10 NOTE — Patient Instructions (Signed)
[facility] Discharge Instructions for Patients Receiving Chemotherapy  Today you received the following chemotherapy agent: Bortezomib (Velcade)  To help prevent nausea and vomiting after your treatment, we encourage you to take your nausea medication as directed by your MD.   If you develop nausea and vomiting that is not controlled by your nausea medication, call the clinic.   BELOW ARE SYMPTOMS THAT SHOULD BE REPORTED IMMEDIATELY:  *FEVER GREATER THAN 100.5 F  *CHILLS WITH OR WITHOUT FEVER  NAUSEA AND VOMITING THAT IS NOT CONTROLLED WITH YOUR NAUSEA MEDICATION  *UNUSUAL SHORTNESS OF BREATH  *UNUSUAL BRUISING OR BLEEDING  TENDERNESS IN MOUTH AND THROAT WITH OR WITHOUT PRESENCE OF ULCERS  *URINARY PROBLEMS  *BOWEL PROBLEMS  UNUSUAL RASH Items with * indicate a potential emergency and should be followed up as soon as possible.  Feel free to call the clinic should you have any questions or concerns. The clinic phone number is [phone].  Please show the [facility] at check-in to the Emergency Department and triage nurse.  Coronavirus (COVID-19) Are you at risk?  Are you at risk for the Coronavirus (COVID-19)?  To be considered HIGH RISK for Coronavirus (COVID-19), you have to meet the following criteria:  . Traveled to [place], [place], [place], [place] or [place]; or in the [place] to [place], [place], [place], or [place]; and have fever, cough, and shortness of breath within the last 2 weeks of travel OR . Been in close contact with a person diagnosed with COVID-19 within the last 2 weeks and have fever, cough, and shortness of breath . IF YOU DO NOT MEET THESE CRITERIA, YOU ARE CONSIDERED LOW RISK FOR COVID-19.  What to do if you are HIGH RISK for COVID-19?  [name] If you are having a medical emergency, call 911. . Seek medical care right away. Before you go to a doctor's office, urgent care or emergency department, call ahead and tell  them about your recent travel, contact with someone diagnosed with COVID-19, and your symptoms. You should receive instructions from your physician's office regarding next steps of care.  . When you arrive at healthcare provider, tell the healthcare staff immediately you have returned from visiting [place], [place], [place], [place] or [place]; or traveled in the [place] to [place], [place], [place], or [place]; in the last two weeks or you have been in close contact with a person diagnosed with COVID-19 in the last 2 weeks.   . Tell the health care staff about your symptoms: fever, cough and shortness of breath. . After you have been seen by a medical provider, you will be either: o Tested for (COVID-19) and discharged home on quarantine except to seek medical care if symptoms worsen, and asked to  - Stay home and avoid contact with others until you get your results (4-5 days)  - Avoid travel on public transportation if possible (such as bus, train, or airplane) or o Sent to the Emergency Department by EMS for evaluation, COVID-19 testing, and possible admission depending on your condition and test results.  What to do if you are LOW RISK for COVID-19?  Reduce your risk of any infection by using the same precautions used for avoiding the common cold or flu:  [name] Wash your hands often with soap and warm water for at least 20 seconds.  If soap and water are not readily available, use an alcohol-based hand sanitizer with at least 60% alcohol.  [name]  If coughing or sneezing, cover your mouth and nose by coughing or sneezing into the elbow areas of your shirt or coat, into a tissue or into your sleeve (not your hands). . Avoid shaking hands with others and consider head nods or verbal greetings only. . Avoid touching your eyes, nose, or mouth with unwashed hands.  . Avoid close contact with people who are sick. . Avoid places or events with large numbers of people in one location, like concerts or  sporting events. . Carefully consider travel plans you have or are making. . If you are planning any travel outside or inside the [place], visit the CDC's Travelers' Health webpage for the latest health notices. . If you have some symptoms but not all symptoms, continue to monitor at home and seek medical attention if your symptoms worsen. . If you are having a medical emergency, call 911.   [place] / e-Visit: [email or web address]         [facility]: [facility]: [phone]                   [facility]: [phone]

## 2020-01-10 NOTE — Telephone Encounter (Signed)
Lab called with critical Scr 8.51. Dr. Alen Blew made aware. No new orders at this time.

## 2020-01-11 DIAGNOSIS — Z992 Dependence on renal dialysis: Secondary | ICD-10-CM | POA: Diagnosis not present

## 2020-01-11 DIAGNOSIS — N2581 Secondary hyperparathyroidism of renal origin: Secondary | ICD-10-CM | POA: Diagnosis not present

## 2020-01-11 DIAGNOSIS — E1129 Type 2 diabetes mellitus with other diabetic kidney complication: Secondary | ICD-10-CM | POA: Diagnosis not present

## 2020-01-11 DIAGNOSIS — N186 End stage renal disease: Secondary | ICD-10-CM | POA: Diagnosis not present

## 2020-01-11 DIAGNOSIS — D631 Anemia in chronic kidney disease: Secondary | ICD-10-CM | POA: Diagnosis not present

## 2020-01-11 DIAGNOSIS — D509 Iron deficiency anemia, unspecified: Secondary | ICD-10-CM | POA: Diagnosis not present

## 2020-01-11 NOTE — Progress Notes (Signed)
Pharmacist Chemotherapy Monitoring - Follow Up Assessment    I verify that I have reviewed each item in the below checklist:  . Regimen for the patient is scheduled for the appropriate day and plan matches scheduled date. [name] Appropriate non-routine labs are ordered dependent on drug ordered. . If applicable, additional medications reviewed and ordered per protocol based on lifetime cumulative doses and/or treatment regimen.   Plan for follow-up and/or issues identified: No . I-vent associated with next due treatment: No . MD and/or nursing notified: No  Isaiah [name] D 01/11/2020 3:27 PM

## 2020-01-12 ENCOUNTER — Other Ambulatory Visit: Payer: Self-pay

## 2020-01-12 ENCOUNTER — Ambulatory Visit (INDEPENDENT_AMBULATORY_CARE_PROVIDER_SITE_OTHER): Payer: Medicare Other | Admitting: Neurology

## 2020-01-12 ENCOUNTER — Other Ambulatory Visit: Payer: Self-pay | Admitting: Neurology

## 2020-01-12 ENCOUNTER — Encounter: Payer: Self-pay | Admitting: Neurology

## 2020-01-12 VITALS — BP 130/83 | HR 83 | Ht 70.0 in | Wt 182.6 lb

## 2020-01-12 DIAGNOSIS — R2689 Other abnormalities of gait and mobility: Secondary | ICD-10-CM | POA: Diagnosis not present

## 2020-01-12 DIAGNOSIS — G40909 Epilepsy, unspecified, not intractable, without status epilepticus: Secondary | ICD-10-CM | POA: Diagnosis not present

## 2020-01-12 DIAGNOSIS — G40209 Localization-related (focal) (partial) symptomatic epilepsy and epileptic syndromes with complex partial seizures, not intractable, without status epilepticus: Secondary | ICD-10-CM | POA: Diagnosis not present

## 2020-01-12 DIAGNOSIS — G8110 Spastic hemiplegia affecting unspecified side: Secondary | ICD-10-CM | POA: Diagnosis not present

## 2020-01-12 DIAGNOSIS — F445 Conversion disorder with seizures or convulsions: Secondary | ICD-10-CM | POA: Diagnosis not present

## 2020-01-12 DIAGNOSIS — R269 Unspecified abnormalities of gait and mobility: Secondary | ICD-10-CM | POA: Diagnosis not present

## 2020-01-12 MED ORDER — LACOSAMIDE 200 MG PO TABS: ORAL_TABLET | ORAL | 5 refills | Status: DC

## 2020-01-12 MED ORDER — LAMOTRIGINE 200 MG PO TABS: 200.0000 mg | ORAL_TABLET | Freq: Two times a day (BID) | ORAL | 11 refills | Status: DC

## 2020-01-12 MED ORDER — CLONAZEPAM 0.5 MG PO TABS: ORAL_TABLET | ORAL | 5 refills | Status: DC

## 2020-01-12 MED FILL — clonazePAM 0.5 MG TABS: 0.5 | 35 days supply | Qty: 30 | Fill #0

## 2020-01-12 NOTE — Patient Instructions (Signed)
Great seeing you, looking good! Continue all your seizure medications. Follow-up in 4-5 months, call for any changes.  Seizure Precautions: 1. If medication has been prescribed for you to prevent seizures, take it exactly as directed.  Do not stop taking the medicine without talking to your doctor first, even if you have not had a seizure in a long time.   2. Avoid activities in which a seizure would cause danger to yourself or to others.  Don't operate dangerous machinery, swim alone, or climb in high or dangerous places, such as on ladders, roofs, or girders.  Do not drive unless your doctor says you may.  3. If you have any warning that you may have a seizure, lay down in a safe place where you can't hurt yourself.    4.  No driving for 6 months from last seizure, as per [facility].   Please refer to the following link on the [place] website for more information: http://www.epilepsyfoundation.org/answerplace/Social/driving/drivingu.cfm   5.  Maintain good sleep hygiene.  6.  Contact your doctor if you have any problems that may be related to the medicine you are taking.  7.  Call 911 and bring the patient back to the ED if:        A.  The seizure lasts longer than 5 minutes.       B.  The patient doesn't awaken shortly after the seizure  C.  The patient has new problems such as difficulty seeing, speaking or moving  D.  The patient was injured during the seizure  E.  The patient has a temperature over 102 F (39C)  F.  The patient vomited and now is having trouble breathing

## 2020-01-12 NOTE — Progress Notes (Signed)
NEUROLOGY FOLLOW UP OFFICE NOTE  Isaiah [name] [facility] [id number] 04-02-1966  HISTORY OF PRESENT ILLNESS: I had the pleasure of seeing Isaiah [facility] in follow-up in the neurology clinic on 01/12/2020.  The patient was last seen 3 months ago for seizures secondary to left MCA stroke in 0867, complicated by right subdural hematoma in 2020. He is again accompanied by his wife Isaiah [name] who helps supplement the history today. On his last visit, his wife continued to report episodes where he is not responding well, "limp like a noodle," unable to follow instructions. Episode captured on EEG did not show any epileptiform correlate. BP was not significantly low during the episodes. We discussed increasing Lamotrigine to 246m BID, continue Vimpat 2010mBID. He is also on clonazepam 17m110mrior to dialysis and has not had any convulsions with HD since 05/2019. His wife has noticed an improvement with the unresponsive episodes with the increase in Lamotrigine. He had one in March, 3 in April, 1 on 5/3 during dialysis, he is described as staring into space, not responding for a few seconds. He was given O2 during dialysis. She states they are very brief. He would tell her he can hear her, but she knows he did not hear her. During these times, she would have to wash him up, which he is usually able to do himself. No loss of consciousness. He denies any headaches, dizziness, diplopia. Overall he feels pretty good. He has made good strides with mobility, he now uses his walker and states he uses is pretty good. He tried to walk without his walker last weekend with his wife using a gait belt. He denies feeling confused but has to think about what he wants to say.    History on Initial Assessment 10/29/2017: This is a pleasant [age] RH man with a history of diabetes, sickle-cell thalassemia, DVT and PE off Coumadin due to GI bleed, sleep apnea, MGUS, CKD on dialysis, and strokes in multiple vascular distributions in January 2017 with  subsequent seizures. Stroke workup showed left M2 superior branch occlusion, TEE with PFO, hypercoagulation studies negative. He is now on aspirin for secondary stroke prevention. He made significant improvement with right-sided weakness and speech was more fluent. He had a seizure in April 2017 and was started on Vimpat 100m77mD. EEG reported diffuse slowing. Limited MRI brain showed multiple late subacute and chronic infarcts, and repeat imaging in May 2017 showed resolution with no new infarct. [place] and was found to be anemic with Hgb of 6.8, requiring multiple blood transfusions. Vimpat increased to 150mg74m. EEG showed mild intermittent generalized theta slowing. He had another seizure after dialysis in November 2017 and Dilantin 300mg 38my was added. His wife did not want him to be on this long term, and was switched to Lamotrigine. He had another seizure in January 2018 a few days after Dilantin was tapered off. He had another seizure in October 2018 again after dialysis. The last seizure was on 10/19/17 again after dialysis. This was slightly different because he was belligerent and agitated and his wife had to calm him down. He is taking Vimpat 150mg B57mnd Lamotrigine 150mg BI16mth no side effects. He sometimes has a warning prior to the seizures. With the most recent seizure, he was standing at the kitchen table, recalls his wife picking him up, then coming to on the sofa. Seizures last 5-10 minutes, his wife describes staring off, unresponsive,  then trembling/low amplitude shaking. He is tired after, with worsened right-sided weakness. He would be "pretty out of it" the next day. Majority of seizures occur right after dialysis, he would still be sitting in the dialysis unit, but the most recent seizure occurred at home an hour after dialysis. He reports BP is not an issue because his nephrologist has regulated his medications and BP  is good most of the time.   He has occasional tremors in both legs, he has a right hand tremor noted in the office today. He noticed memory changes after his stroke, but does not think memory is worse after each seizure. He wonders about his short-term memory, sometimes he has to think which is his right or left side. He denies any olfactory/gustatory hallucinations, deja vu, rising epigastric sensation,myoclonic jerks.   Epilepsy Risk Factors:  Prior strokes in multiple vascular distributions. Otherwise he had a normal birth and early development.  There is no history of febrile convulsions, CNS infections such as meningitis/encephalitis, significant traumatic brain injury, neurosurgical procedures, or family history of seizures.  Diagnostic Data: I personally reviewed MRI brain without contrast done 01/2016 which showed left MCA infarct, right posterior temporal and occipital hemorrhagic infarct, left frontal infarct with remote blood products, remote hemorrhagic infarct in the more superior right frontal lobe, remote nonhemorrhagic lacunar infarcts in the left cerebellum.  48-hour EEG in Jan 2021 showed occasional left temporal slowing, no epileptiform discharges. Typical episode of confusion and weakness captured did not show epileptiform correlate.   Prior AEDs: Dilantin   PAST MEDICAL HISTORY: Past Medical History:  Diagnosis Date  . A-fib ([place])   . Anemia   . Asthma   . DM type 2 (diabetes mellitus, type 2) ([place]) 06/09/2019  . ESRD (end stage renal disease) on dialysis ([place])   . Essential hypertension 06/09/2019  . GIB (gastrointestinal bleeding)    Recurrent episodes- 09/2014, 09/2015 and 05/2016  . Gout   . History of recent blood transfusion 10/27/14   2 Units PRBC's  . Hyperkalemia 07/2011  . Multiple myeloma ([place])   . OSA on CPAP   . Pulmonary embolism ([place]) 07/2011; 09/27/2014   a. Bilat PE 07/2011 - unclear cause, tx with 6 months Coumadin.;   . Seizure disorder ([place])  06/09/2019  . Sepsis ([place])   . Sickle cell-thalassemia disease ([place])    a. Sickle cell trait.  . Sleep apnea   . Stroke ([place]) 09/2015   R-MCA, L-MCA, PCA and bilateral cerebellar complicated by DVT/PE  . Subdural hematoma ([place]) 05/2019     MEDICATIONS: Current Outpatient Medications on File Prior to Visit  Medication Sig Dispense Refill  . acetaminophen (TYLENOL) 325 MG tablet Take 1-2 tablets (325-650 mg total) by mouth every 4 (four) hours as needed for mild pain.    [name] acyclovir (ZOVIRAX) 400 MG tablet Take 400 mg by mouth at bedtime.    [name] albuterol (PROAIR HFA) 108 (90 Base) MCG/ACT inhaler Inhale two puffs every 4-6 hours if needed for cough or wheeze 1 Inhaler 1  . camphor-menthol (SARNA) lotion Apply 1 application topically every 8 (eight) hours as needed for itching. 222 mL 0  . Cetirizine HCl 10 MG CAPS Take 1 capsule by mouth daily.    . clonazePAM (KLONOPIN) 0.5 MG tablet Take 2 tablets every Monday, Wednesday, and Friday with hemodialysis 30 tablet 5  . dexamethasone (DECADRON) 4 MG tablet Take 5 tablets (20 mg total) by mouth once a week. Take 5 tablets (20 mg total)  by mouth once a week on day of chemo. 40 tablet 3  . fluticasone furoate-vilanterol (BREO ELLIPTA) 200-25 MCG/INH AEPB Inhale 1 puff into the lungs daily.    . hydrOXYzine (ATARAX/VISTARIL) 25 MG tablet Take 1 tablet (25 mg total) by mouth every 8 (eight) hours as needed for itching. 30 tablet 0  . Insulin Lispro (HUMALOG KWIKPEN [place]) Inject 2-4 Units into the skin 3 (three) times daily before meals. Sliding scale: 150-250 =2 units, 250 -350=3 units, 350 += 4 units    . ketorolac (ACULAR) 0.5 % ophthalmic solution Place 1 drop into both eyes 4 (four) times daily.    [name] lacosamide (VIMPAT) 200 MG TABS tablet Take 1 tablet twice a day except on dialysis days M-W-F give 1 tab in AM, 1 tab after dialysis, 1 tab in PM. 90 tablet 5  . lamoTRIgine (LAMICTAL) 150 MG tablet Take 1 tablet (150 mg total) by mouth 2 (two) times  daily. 180 tablet 3  . midodrine (PROAMATINE) 5 MG tablet Take one tablet by mouth 30 minutes before dialysis. 30 tablet 1  . multivitamin (RENA-VIT) TABS tablet Take 1 tablet by mouth at bedtime.    . Nutritional Supplements (FEEDING SUPPLEMENT, NEPRO CARB STEADY,) LIQD Take 237 mLs by mouth 3 (three) times daily as needed (Supplement). 237 mL 12  . pantoprazole (PROTONIX) 40 MG tablet Take 40 mg by mouth daily.    . prochlorperazine (COMPAZINE) 10 MG tablet TAKE 1 TABLET (10 MG TOTAL) BY MOUTH EVERY 6 (SIX) HOURS AS NEEDED FOR NAUSEA OR VOMITING. 30 tablet 0  . calcium carbonate (TUMS - DOSED IN MG ELEMENTAL CALCIUM) 500 MG chewable tablet Chew 2 tablets by mouth at bedtime.    . cinacalcet (SENSIPAR) 60 MG tablet Take 60 mg by mouth every evening. ON HOLD FOR DIALYSIS PER WIFE    . prednisoLONE acetate (PRED FORTE) 1 % ophthalmic suspension Place 1 drop into the left eye daily.      No current facility-administered medications on file prior to visit.    ALLERGIES: Allergies  Allergen Reactions  . Codeine Swelling  . Codeine Rash and Other (See Comments)    Unknown reaction (patient says it was more serious than just a rash, but he can't remember what happened) Unknown reaction (patient says it was more serious than just a rash, but he can't remember what happened)    FAMILY HISTORY: Family History  Problem Relation Age of Onset  . Hypertension Mother   . Hypertension Father   . Stroke Father   . Sickle cell anemia Brother   . Diabetes Mother   . Sickle cell anemia Brother   . Diabetes type I Brother   . Kidney disease Brother     SOCIAL HISTORY: Social History   Socioeconomic History  . Marital status: Married    Spouse name: sylvia  . Number of children: 1  . Years of education: college  . Highest education level: Not on file  Occupational History  . Occupation: [employer]  . Occupation: disability  Tobacco Use  . Smoking status: Never Smoker  .  Smokeless tobacco: Never Used  Substance and Sexual Activity  . Alcohol use: Not Currently  . Drug use: Never  . Sexual activity: Yes    Partners: Female  Other Topics Concern  . Not on file  Social History Narrative   ** Merged History Encounter **       Pt lives in 2 story home with his wife and 1  son Has masters degree in psychology Currently disabled.     Social Determinants of Health   Financial Resource Strain: Low Risk   . Difficulty of Paying Living Expenses: Not hard at all  Food Insecurity: No Food Insecurity  . Worried About Charity fundraiser in the Last Year: Never true  . Ran Out of Food in the Last Year: Never true  Transportation Needs: No Transportation Needs  . Lack of Transportation (Medical): No  . Lack of Transportation (Non-Medical): No  Physical Activity: Inactive  . Days of Exercise per Week: 0 days  . Minutes of Exercise per Session: 0 min  Stress: No Stress Concern Present  . Feeling of Stress : Not at all  Social Connections:   . Frequency of Communication with Friends and Family:   . Frequency of Social Gatherings with Friends and Family:   . Attends Religious Services:   . Active Member of Clubs or Organizations:   . Attends Archivist Meetings:   [name] Marital Status:   Intimate Partner Violence: Not At Risk  . Fear of Current or Ex-Partner: No  . Emotionally Abused: No  . Physically Abused: No  . Sexually Abused: No    PHYSICAL EXAM: Vitals:   01/12/20 1148  BP: 130/83  Pulse: 83  SpO2: 99%   General: No acute distress Head:  Normocephalic/atraumatic Skin/Extremities: No rash, no edema Neurological Exam: alert and oriented to person, place, and time. No aphasia or dysarthria. Fund of knowledge is reduced. Recent and remote memory are impaired. Attention and concentration are normal.  Cranial nerves: Pupils equal, round, reactive to light.  Extraocular movements intact with no nystagmus. Visual fields full. No facial  asymmetry. Motor: Muscle strength 4/5 on right UE and LE, 5/5 on left, spastic contracture of right thumb/index finger. Deep tendon reflexes brisk +3 on right side. Finger to nose testing intact.  Gait: needed 2-person assist to stand, took a few slow and cautious steps with no ataxia  IMPRESSION: This is a pleasant 54 yo RH man with a history of  diabetes, sickle-cell thalassemia, DVT and PE off Coumadin due to GI bleed, sleep apnea, CKD on dialysis, strokes in multiple vascular distributions in January 2017 with subsequent seizures. He had a complicated course while preparing for stem cell transplant for multiple myeloma, found to have a nontraumatic right subdural hematoma in 05/2019. His prior seizures were mostly occurring during dialysis and had good response to clonazepam dose prior to HD. His wife started reporting new episodes where he would not respond/follow instructions to move his legs. EEG captured one of these with no epileptiform changes seen. He appears to have had a reduction in spells with increase in Lamotrigine, continue Lamotrigine 222m BID and Vimpat 2045mBID (with additional dose after HD). Mobility has significantly improved, continue home PT. He does not drive. Follow-up in 4-5 months, they know to call for any changes.   Thank you for allowing me to participate in his care.  Please do not hesitate to call for any questions or concerns.   KaEllouise NewerM.D.   CC: Dr. SaBobetta Lime

## 2020-01-13 DIAGNOSIS — Z992 Dependence on renal dialysis: Secondary | ICD-10-CM | POA: Diagnosis not present

## 2020-01-13 DIAGNOSIS — D509 Iron deficiency anemia, unspecified: Secondary | ICD-10-CM | POA: Diagnosis not present

## 2020-01-13 DIAGNOSIS — N2581 Secondary hyperparathyroidism of renal origin: Secondary | ICD-10-CM | POA: Diagnosis not present

## 2020-01-13 DIAGNOSIS — D631 Anemia in chronic kidney disease: Secondary | ICD-10-CM | POA: Diagnosis not present

## 2020-01-13 DIAGNOSIS — N186 End stage renal disease: Secondary | ICD-10-CM | POA: Diagnosis not present

## 2020-01-13 DIAGNOSIS — E1129 Type 2 diabetes mellitus with other diabetic kidney complication: Secondary | ICD-10-CM | POA: Diagnosis not present

## 2020-01-13 LAB — KAPPA/LAMBDA LIGHT CHAINS
Kappa free light chain: 132.1 mg/L — ABNORMAL HIGH (ref 3.3–19.4)
Kappa, lambda light chain ratio: 2.72 — ABNORMAL HIGH (ref 0.26–1.65)
Lambda free light chains: 48.5 mg/L — ABNORMAL HIGH (ref 5.7–26.3)

## 2020-01-13 LAB — MULTIPLE MYELOMA PANEL, SERUM
Albumin SerPl Elph-Mcnc: 3.9 g/dL (ref 2.9–4.4)
Albumin/Glob SerPl: 1.6 (ref 0.7–1.7)
Alpha 1: 0.3 g/dL (ref 0.0–0.4)
Alpha2 Glob SerPl Elph-Mcnc: 0.7 g/dL (ref 0.4–1.0)
B-Globulin SerPl Elph-Mcnc: 0.8 g/dL (ref 0.7–1.3)
Gamma Glob SerPl Elph-Mcnc: 0.7 g/dL (ref 0.4–1.8)
Globulin, Total: 2.5 g/dL (ref 2.2–3.9)
IgA: 23 mg/dL — ABNORMAL LOW (ref 90–386)
IgG (Immunoglobin G), Serum: 726 mg/dL (ref 603–1613)
IgM (Immunoglobulin M), Srm: 18 mg/dL — ABNORMAL LOW (ref 20–172)
M Protein SerPl Elph-Mcnc: 0.2 g/dL — ABNORMAL HIGH
Total Protein ELP: 6.4 g/dL (ref 6.0–8.5)

## 2020-01-16 ENCOUNTER — Ambulatory Visit: Payer: Self-pay

## 2020-01-16 ENCOUNTER — Other Ambulatory Visit: Payer: Self-pay | Admitting: Internal Medicine

## 2020-01-16 DIAGNOSIS — D631 Anemia in chronic kidney disease: Secondary | ICD-10-CM | POA: Diagnosis not present

## 2020-01-16 DIAGNOSIS — C9000 Multiple myeloma not having achieved remission: Secondary | ICD-10-CM

## 2020-01-16 DIAGNOSIS — N186 End stage renal disease: Secondary | ICD-10-CM

## 2020-01-16 DIAGNOSIS — E1122 Type 2 diabetes mellitus with diabetic chronic kidney disease: Secondary | ICD-10-CM

## 2020-01-16 DIAGNOSIS — E1129 Type 2 diabetes mellitus with other diabetic kidney complication: Secondary | ICD-10-CM | POA: Diagnosis not present

## 2020-01-16 DIAGNOSIS — D509 Iron deficiency anemia, unspecified: Secondary | ICD-10-CM | POA: Diagnosis not present

## 2020-01-16 DIAGNOSIS — N2581 Secondary hyperparathyroidism of renal origin: Secondary | ICD-10-CM | POA: Diagnosis not present

## 2020-01-16 DIAGNOSIS — Z992 Dependence on renal dialysis: Secondary | ICD-10-CM | POA: Diagnosis not present

## 2020-01-16 NOTE — Patient Instructions (Signed)
Social Worker Visit Information  Goals we discussed today:  Goals Addressed            This Visit's Progress   . Collaborate with RN Care Manager to perform apprpriate assessments to determine care management and care coordination needs       Current Barriers:  . Care management and care coordination needs related to DMII, ESRD, and multiple myeloma  which resulted in inpatient hospitalization . ADL IADL limitations  Clinical Social Work Clinical Goal(s):  [name] Over the next 60 days, patient will collaborate with care management team to assist with care coordination needs in response to recent inpatient hospitalization . New 01/16/20- Over the next 20 days the patient and his spouse will work with SW to identify cause of Medicaid denial and initiate an appeal  CCM SW Interventions: Completed 01/16/20 with [name] . Inbound call received from Mrs. Hill whom reports she received a letter in the mail indicating the patients Medicaid was denied due to Physician LOC form either not being received by deadline or being incomplete . Advised Mrs. [place] would collaborate with the patients primary provider regarding form needed for Medicaid approval . Discussed opportunity to appeal decision in order to resubmit document from providers office . Collaboration with Dr. Baird Cancer requesting feedback regarding date LOC form was submitted and whether items were incomplete . Scheduled follow up call to Mrs. [place] over the next two days  Patient Self Care Activities:  . Attends all scheduled provider appointments . Calls pharmacy for medication refills . Calls provider office for new concerns or questions . Supportive wife and caregiver to assist with patient care needs   Please see past updates related to this goal by clicking on the "Past Updates" button in the selected goal         Follow Up Plan: SW will follow up with patient by phone over the next two days   [name], BSW,  CDP Social Worker, Certified Dementia Practitioner [place] [facility] [place] [facility] [phone]

## 2020-01-16 NOTE — Chronic Care Management (AMB) (Signed)
Chronic Care Management    Social Work Follow Up Note  01/16/2020 Name: Isaiah [name] MRN: [id number] DOB: Oct 20, 1965  Isaiah [name] is a 54 y.o. year old male who is a primary care patient of [name], MD. The CCM team was consulted for assistance with care coordination.   Review of patient status, including review of consultants reports, other relevant assessments, and collaboration with appropriate care team members and the patient's provider was performed as part of comprehensive patient evaluation and provision of chronic care management services.    SDOH (Social Determinants of Health) assessments performed: No    Outpatient Encounter Medications as of 01/16/2020  Medication Sig Note  . acetaminophen (TYLENOL) 325 MG tablet Take 1-2 tablets (325-650 mg total) by mouth every 4 (four) hours as needed for mild pain.   [name] acyclovir (ZOVIRAX) 400 MG tablet TAKE 1 TABLET (400 MG TOTAL) BY MOUTH DAILY.   [name] albuterol (PROAIR HFA) 108 (90 Base) MCG/ACT inhaler Inhale two puffs every 4-6 hours if needed for cough or wheeze   . Alcohol Swabs (SM ALCOHOL PREP) 70 % PADS    . AURYXIA 1 GM 210 MG(Fe) tablet Take 210 mg by mouth 3 (three) times daily.   [name] BREO ELLIPTA 200-25 MCG/INH AEPB INHALE 1 PUFF BY MOUTH AT THE SAME TIME EVERY DAY   . calcitRIOL (ROCALTROL) 0.25 MCG capsule Take by mouth 3 (three) times a week.   . calcium carbonate (TUMS - DOSED IN MG ELEMENTAL CALCIUM) 500 MG chewable tablet Chew 2 tablets by mouth at bedtime. 10/04/2019: On hold per MD  . camphor-menthol (SARNA) lotion Apply 1 application topically every 8 (eight) hours as needed for itching.   . Cetirizine HCl 10 MG CAPS Take 1 capsule by mouth daily.   . cinacalcet (SENSIPAR) 60 MG tablet Take 60 mg by mouth every evening. ON HOLD FOR DIALYSIS PER WIFE   . clonazePAM (KLONOPIN) 0.5 MG tablet Take 2 tablets every Monday, Wednesday, and Friday with hemodialysis   . DARZALEX FASPRO 1800-30000 MG-UT/15ML SOLN Inject 1     . dexamethasone (DECADRON) 4 MG tablet Take 5 tablets (20 mg total) by mouth once a week. Take 5 tablets (20 mg total) by mouth once a week on day of chemo.   [name] FREESTYLE LITE test strip    . hydrOXYzine (ATARAX/VISTARIL) 25 MG tablet Take 1 tablet (25 mg total) by mouth every 8 (eight) hours as needed for itching.   . Insulin Glargine (BASAGLAR KWIKPEN) 100 UNIT/ML 8 Units.    . Insulin Lispro (HUMALOG KWIKPEN [place]) Inject 2-4 Units into the skin 3 (three) times daily before meals. Sliding scale: 150-250 =2 units, 250 -350=3 units, 350 += 4 units (On Hold)   . lacosamide (VIMPAT) 200 MG TABS tablet Take 1 tablet twice a day except on dialysis days M-W-F give 1 tab in AM, 1 tab after dialysis, 1 tab in PM.   . lamoTRIgine (LAMICTAL) 200 MG tablet Take 1 tablet (200 mg total) by mouth 2 (two) times daily.   . Lancets (FREESTYLE) lancets    . Methoxy PEG-Epoetin Beta (MIRCERA IJ) Mircera   . midodrine (PROAMATINE) 5 MG tablet Take one tablet by mouth 30 minutes before dialysis.   [name] multivitamin (RENA-VIT) TABS tablet Take 1 tablet by mouth at bedtime.   . Nutritional Supplements (FEEDING SUPPLEMENT, NEPRO CARB STEADY,) LIQD Take 237 mLs by mouth 3 (three) times daily as needed (Supplement).   . pantoprazole (PROTONIX) 40 MG tablet TAKE 1  TABLET BY MOUTH ONCE DAILY   . prochlorperazine (COMPAZINE) 10 MG tablet TAKE 1 TABLET (10 MG TOTAL) BY MOUTH EVERY 6 (SIX) HOURS AS NEEDED FOR NAUSEA OR VOMITING.   [name] UNIFINE PENTIPS 31G X 8 MM MISC USE AS DIRECTED DAILY AFTER SUPPER    No facility-administered encounter medications on file as of 01/16/2020.     Goals Addressed            This Visit's Progress   . Collaborate with RN Care Manager to perform apprpriate assessments to determine care management and care coordination needs       Current Barriers:  . Care management and care coordination needs related to DMII, ESRD, and multiple myeloma  which resulted in inpatient hospitalization . ADL IADL  limitations  Clinical Social Work Clinical Goal(s):  [name] Over the next 60 days, patient will collaborate with care management team to assist with care coordination needs in response to recent inpatient hospitalization . New 01/16/20- Over the next 20 days the patient and his spouse will work with SW to identify cause of Medicaid denial and initiate an appeal  CCM SW Interventions: Completed 01/16/20 with [facility] . Inbound call received from Mrs. Hill whom reports she received a letter in the mail indicating the patients Medicaid was denied due to Physician LOC form either not being received by deadline or being incomplete . Advised Mrs. [place] would collaborate with the patients primary provider regarding form needed for Medicaid approval . Discussed opportunity to appeal decision in order to resubmit document from providers office . Collaboration with Dr. Baird Cancer requesting feedback regarding date LOC form was submitted and whether items were incomplete . Scheduled follow up call to Mrs. [place] over the next two days  Patient Self Care Activities:  . Attends all scheduled provider appointments . Calls pharmacy for medication refills . Calls provider office for new concerns or questions . Supportive wife and caregiver to assist with patient care needs   Please see past updates related to this goal by clicking on the "Past Updates" button in the selected goal         Follow Up Plan: SW will follow up with patient by phone over the next two days   [name], BSW, CDP Social Worker, Certified Dementia Practitioner [place] [facility] [place] [facility] [phone]  Total time spent performing care coordination and/or care management activities with the patient by phone or face to face = 8 minutes.

## 2020-01-17 ENCOUNTER — Inpatient Hospital Stay (HOSPITAL_BASED_OUTPATIENT_CLINIC_OR_DEPARTMENT_OTHER): Payer: Medicare Other | Admitting: Oncology

## 2020-01-17 ENCOUNTER — Inpatient Hospital Stay: Payer: Medicare Other

## 2020-01-17 ENCOUNTER — Other Ambulatory Visit: Payer: Self-pay

## 2020-01-17 VITALS — BP 120/63 | HR 88 | Temp 98.2°F | Resp 18 | Ht 70.0 in | Wt 179.8 lb

## 2020-01-17 DIAGNOSIS — D6489 Other specified anemias: Secondary | ICD-10-CM | POA: Diagnosis not present

## 2020-01-17 DIAGNOSIS — C9000 Multiple myeloma not having achieved remission: Secondary | ICD-10-CM

## 2020-01-17 DIAGNOSIS — N19 Unspecified kidney failure: Secondary | ICD-10-CM | POA: Diagnosis not present

## 2020-01-17 DIAGNOSIS — Z5112 Encounter for antineoplastic immunotherapy: Secondary | ICD-10-CM | POA: Diagnosis not present

## 2020-01-17 DIAGNOSIS — R531 Weakness: Secondary | ICD-10-CM | POA: Diagnosis not present

## 2020-01-17 DIAGNOSIS — R5381 Other malaise: Secondary | ICD-10-CM | POA: Diagnosis not present

## 2020-01-17 DIAGNOSIS — Z992 Dependence on renal dialysis: Secondary | ICD-10-CM | POA: Diagnosis not present

## 2020-01-17 LAB — CBC WITH DIFFERENTIAL (CANCER CENTER ONLY)
Abs Immature Granulocytes: 0 10*3/uL (ref 0.00–0.07)
Basophils Absolute: 0.3 10*3/uL — ABNORMAL HIGH (ref 0.0–0.1)
Basophils Relative: 2 %
Eosinophils Absolute: 0.1 10*3/uL (ref 0.0–0.5)
Eosinophils Relative: 1 %
HCT: 35.1 % — ABNORMAL LOW (ref 39.0–52.0)
Hemoglobin: 13 g/dL (ref 13.0–17.0)
Lymphocytes Relative: 1 %
Lymphs Abs: 0.1 10*3/uL — ABNORMAL LOW (ref 0.7–4.0)
MCH: 33.7 pg (ref 26.0–34.0)
MCHC: 37 g/dL — ABNORMAL HIGH (ref 30.0–36.0)
MCV: 90.9 fL (ref 80.0–100.0)
Monocytes Absolute: 0.1 10*3/uL (ref 0.1–1.0)
Monocytes Relative: 1 %
Neutro Abs: 12.3 10*3/uL — ABNORMAL HIGH (ref 1.7–7.7)
Neutrophils Relative %: 95 %
Platelet Count: 301 10*3/uL (ref 150–400)
RBC: 3.86 MIL/uL — ABNORMAL LOW (ref 4.22–5.81)
RDW: 15.5 % (ref 11.5–15.5)
WBC Count: 12.9 10*3/uL — ABNORMAL HIGH (ref 4.0–10.5)
nRBC: 31 /100 WBC — ABNORMAL HIGH

## 2020-01-17 LAB — CMP (CANCER CENTER ONLY)
ALT: 10 U/L (ref 0–44)
AST: 11 U/L — ABNORMAL LOW (ref 15–41)
Albumin: 3.9 g/dL (ref 3.5–5.0)
Alkaline Phosphatase: 161 U/L — ABNORMAL HIGH (ref 38–126)
Anion gap: 17 — ABNORMAL HIGH (ref 5–15)
BUN: 42 mg/dL — ABNORMAL HIGH (ref 6–20)
CO2: 30 mmol/L (ref 22–32)
Calcium: 10.1 mg/dL (ref 8.9–10.3)
Chloride: 95 mmol/L — ABNORMAL LOW (ref 98–111)
Creatinine: 8.54 mg/dL (ref 0.61–1.24)
GFR, Est AFR Am: 7 mL/min — ABNORMAL LOW (ref >60)
GFR, Estimated: 6 mL/min — ABNORMAL LOW (ref >60)
Glucose, Bld: 167 mg/dL — ABNORMAL HIGH (ref 70–99)
Potassium: 4.9 mmol/L (ref 3.5–5.1)
Sodium: 142 mmol/L (ref 135–145)
Total Bilirubin: 1 mg/dL (ref 0.3–1.2)
Total Protein: 7.7 g/dL (ref 6.5–8.1)

## 2020-01-17 MED ORDER — BORTEZOMIB CHEMO SQ INJECTION 3.5 MG (2.5MG/ML)
1.5000 mg/m2 | Freq: Once | INTRAMUSCULAR | Status: AC
  Administered 2020-01-17: 3 mg via SUBCUTANEOUS
  Filled 2020-01-17: qty 1.2

## 2020-01-17 MED ORDER — PROCHLORPERAZINE MALEATE 10 MG PO TABS
10.0000 mg | ORAL_TABLET | Freq: Once | ORAL | Status: AC
  Administered 2020-01-17: 10 mg via ORAL

## 2020-01-17 MED ORDER — PROCHLORPERAZINE MALEATE 10 MG PO TABS
ORAL_TABLET | ORAL | Status: AC
  Filled 2020-01-17: qty 1

## 2020-01-17 NOTE — Progress Notes (Signed)
Hematology and Oncology Follow Up Visit  [place] [id number] April 30, 1966 54 y.o. 01/17/2020 11:57 AM    Principle Diagnosis: 54 year old man with multiple myeloma diagnosed in 2019 arising from MGUS.  He was found to have IgG kappa subtype.   Past therapy: Velcade dexamethasone and cyclophosphamide weekly started on 05/25/2018.  Velcade is given at 1.5 mg/m weekly, dexamethasone is 20 mg weekly and and Cytoxan is given at 650 mg total dose.  Last treatment was given in August 2020.    Therapy was held today in anticipation of high-dose chemotherapy and autologous stem cell transplant.  He did not receive stem cell transplant due to subdural hematoma and sepsis in September 2020.  Current therapy:  Velcade at 1.5 mg per metered squared subcutaneously weekly, dexamethasone 20 mg weekly, daratumumab 1800 mg subcutaneous injection.   Cycle 1 started on 08/30/2019. He is here for cycle 17 of therapy.  He is currently receiving daratumumab on a monthly basis.  Interim History:  [name] returns today for a follow-up visit.  Since the last visit, he reports no major changes in his health.  He denies any nausea, vomiting or abdominal pain.  He did report a couple days of increased weakness and increased seizure activity.  Denies any recent falls or syncope.  Denies any complications related to his current therapy.  His performance status and quality of life remained not dramatically changed.  He does ambulate with assistance using a walker.                      Medications: Updated today.  Current Outpatient Medications:  .  acetaminophen (TYLENOL) 325 MG tablet, Take 1-2 tablets (325-650 mg total) by mouth every 4 (four) hours as needed for mild pain., Disp:  , Rfl:  .  acyclovir (ZOVIRAX) 400 MG tablet, TAKE 1 TABLET (400 MG TOTAL) BY MOUTH DAILY., Disp: 90 tablet, Rfl: 0 .  albuterol (PROAIR HFA) 108 (90 Base) MCG/ACT inhaler, Inhale two puffs every 4-6 hours if needed for  cough or wheeze, Disp: 1 Inhaler, Rfl: 1 .  Alcohol Swabs (SM ALCOHOL PREP) 70 % PADS, , Disp: , Rfl:  .  AURYXIA 1 GM 210 MG(Fe) tablet, Take 210 mg by mouth 3 (three) times daily., Disp: , Rfl:  .  BREO ELLIPTA 200-25 MCG/INH AEPB, INHALE 1 PUFF BY MOUTH AT THE SAME TIME EVERY DAY, Disp: 60 each, Rfl: 2 .  calcitRIOL (ROCALTROL) 0.25 MCG capsule, Take by mouth 3 (three) times a week., Disp: , Rfl:  .  calcium carbonate (TUMS - DOSED IN MG ELEMENTAL CALCIUM) 500 MG chewable tablet, Chew 2 tablets by mouth at bedtime., Disp: , Rfl:  .  camphor-menthol (SARNA) lotion, Apply 1 application topically every 8 (eight) hours as needed for itching., Disp: 222 mL, Rfl: 0 .  Cetirizine HCl 10 MG CAPS, Take 1 capsule by mouth daily., Disp: , Rfl:  .  cinacalcet (SENSIPAR) 60 MG tablet, Take 60 mg by mouth every evening. ON HOLD FOR DIALYSIS PER WIFE, Disp: , Rfl:  .  clonazePAM (KLONOPIN) 0.5 MG tablet, Take 2 tablets every Monday, Wednesday, and Friday with hemodialysis, Disp: 30 tablet, Rfl: 5 .  DARZALEX FASPRO 1800-30000 MG-UT/15ML SOLN, Inject 1, Disp: , Rfl:  .  dexamethasone (DECADRON) 4 MG tablet, Take 5 tablets (20 mg total) by mouth once a week. Take 5 tablets (20 mg total) by mouth once a week on day of chemo., Disp: 40 tablet, Rfl: 3 .  FREESTYLE LITE test strip, , Disp: , Rfl:  .  hydrOXYzine (ATARAX/VISTARIL) 25 MG tablet, Take 1 tablet (25 mg total) by mouth every 8 (eight) hours as needed for itching., Disp: 30 tablet, Rfl: 0 .  Insulin Glargine (BASAGLAR KWIKPEN) 100 UNIT/ML, 8 Units. , Disp: , Rfl:  .  Insulin Lispro (HUMALOG KWIKPEN [place]), Inject 2-4 Units into the skin 3 (three) times daily before meals. Sliding scale: 150-250 =2 units, 250 -350=3 units, 350 += 4 units (On Hold), Disp: , Rfl:  .  lacosamide (VIMPAT) 200 MG TABS tablet, Take 1 tablet twice a day except on dialysis days M-W-F give 1 tab in AM, 1 tab after dialysis, 1 tab in PM., Disp: 90 tablet, Rfl: 5 .  lamoTRIgine (LAMICTAL)  200 MG tablet, Take 1 tablet (200 mg total) by mouth 2 (two) times daily., Disp: 60 tablet, Rfl: 11 .  Lancets (FREESTYLE) lancets, , Disp: , Rfl:  .  Methoxy PEG-Epoetin Beta (MIRCERA IJ), Mircera, Disp: , Rfl:  .  midodrine (PROAMATINE) 5 MG tablet, Take one tablet by mouth 30 minutes before dialysis., Disp: 30 tablet, Rfl: 1 .  multivitamin (RENA-VIT) TABS tablet, Take 1 tablet by mouth at bedtime., Disp: , Rfl:  .  Nutritional Supplements (FEEDING SUPPLEMENT, NEPRO CARB STEADY,) LIQD, Take 237 mLs by mouth 3 (three) times daily as needed (Supplement)., Disp: 237 mL, Rfl: 12 .  pantoprazole (PROTONIX) 40 MG tablet, TAKE 1 TABLET BY MOUTH ONCE DAILY, Disp: 90 tablet, Rfl: 1 .  prochlorperazine (COMPAZINE) 10 MG tablet, TAKE 1 TABLET (10 MG TOTAL) BY MOUTH EVERY 6 (SIX) HOURS AS NEEDED FOR NAUSEA OR VOMITING., Disp: 30 tablet, Rfl: 0 .  UNIFINE PENTIPS 31G X 8 MM MISC, USE AS DIRECTED DAILY AFTER SUPPER, Disp: 100 each, Rfl: 3  Allergies:  Allergies  Allergen Reactions  . Codeine Swelling  . Codeine Rash and Other (See Comments)    Unknown reaction (patient says it was more serious than just a rash, but he can't remember what happened) Unknown reaction (patient says it was more serious than just a rash, but he can't remember what happened)      Physical Exam:    Blood pressure 120/63, pulse 88, temperature 98.2 F (36.8 C), temperature source Temporal, resp. rate 18, height 5' 10"  (1.778 m), weight 179 lb 12.8 oz (81.6 kg), SpO2 100 %.       ECOG: 1   General appearance: Alert, awake without any distress. Head: Atraumatic without abnormalities Oropharynx: Without any thrush or ulcers. Eyes: No scleral icterus. Lymph nodes: No lymphadenopathy noted in the cervical, supraclavicular, or axillary nodes Heart:regular rate and rhythm, without any murmurs or gallops.   Lung: Clear to auscultation without any rhonchi, wheezes or dullness to percussion. Abdomin: Soft, nontender  without any shifting dullness or ascites. Musculoskeletal: No clubbing or cyanosis. Neurological: No motor or sensory deficits. Skin: No rashes or lesions.                 Lab Results: Lab Results  Component Value Date   WBC 8.7 01/10/2020   HGB 12.1 (L) 01/10/2020   HCT 32.7 (L) 01/10/2020   MCV 91.6 01/10/2020   PLT 231 01/10/2020     Chemistry      Component Value Date/Time   NA 145 01/10/2020 1007   NA 145 12/21/2019 0000   NA 138 07/03/2017 0813   K 4.8 01/10/2020 1007   K 4.2 07/03/2017 0813   CL 98 01/10/2020 1007  CO2 33 (H) 01/10/2020 1007   CO2 27 07/03/2017 0813   BUN 31 (H) 01/10/2020 1007   BUN 58 (A) 12/21/2019 0000   BUN 46.4 (H) 07/03/2017 0813   CREATININE 8.51 (HH) 01/10/2020 1007   CREATININE 10.9 (HH) 07/03/2017 0813   GLU 197 12/21/2019 0000      Component Value Date/Time   CALCIUM 9.2 01/10/2020 1007   CALCIUM 9.3 07/03/2017 0813   ALKPHOS 150 (H) 01/10/2020 1007   ALKPHOS 77 07/03/2017 0813   AST 13 (L) 01/10/2020 1007   AST 16 07/03/2017 0813   ALT 14 01/10/2020 1007   ALT 14 07/03/2017 0813   BILITOT 0.8 01/10/2020 1007   BILITOT 0.93 07/03/2017 0813     Results for OHM, DENTLER (MRN [id number]) as of 01/17/2020 11:46  Ref. Range 11/15/2019 08:56 12/13/2019 10:20 01/10/2020 10:07  Kappa free light chain Latest Ref Range: 3.3 - 19.4 mg/L 119.1 (H) 109.4 (H) 132.1 (H)  Lamda free light chains Latest Ref Range: 5.7 - 26.3 mg/L 43.7 (H) 40.3 (H) 48.5 (H)  Kappa, lamda light chain ratio Latest Ref Range: 0.26 - 1.65  2.73 (H) 2.71 (H) 2.72 (H)   Results for LOIC, HOBIN (MRN [id number]) as of 01/17/2020 11:46  Ref. Range 11/15/2019 08:56 12/13/2019 10:21 01/10/2020 10:07  M Protein SerPl Elph-Mcnc Latest Ref Range: Not Observed g/dL 0.2 (H) 0.2 (H) 0.2 (H)  IFE 1 Unknown Comment (A) Comment (A) Comment (A)  Globulin, Total Latest Ref Range: 2.2 - 3.9 g/dL 2.3 2.4 2.5  B-Globulin SerPl Elph-Mcnc Latest Ref Range: 0.7 - 1.3 g/dL 0.8 0.8  0.8  IgG (Immunoglobin G), Serum Latest Ref Range: 603 - 1,613 mg/dL [phone]  IgM (Immunoglobulin M), Srm Latest Ref Range: 20 - 172 mg/dL 18 (L) 17 (L) 18 (L)  IgA Latest Ref Range: 90 - 386 mg/dL 22 (L) 22 (L) 23 (L)    Impression and Plan:  54 year old man with:  1.  Multiple myeloma diagnosed in 2019 arising from MGUS.  He was found to have IgG kappa subtype.  He continues to tolerate current therapy utilizing Velcade, dexamethasone and daratumumab.  Risks and benefits of continuing this treatment long-term were reviewed.  Potential complications that include infusion related reactions, peripheral neuropathy among others.  Switching to maintenance daratumumab only would be considered in the future.  Evaluation for high-dose chemotherapy and stem cell transplant currently on hold.  I recommended continuing the same regimen till the end of June and switch to maintenance daratumumab at least for the remainder of the year   2. Anemia: Related to plasma cell disorder and renal insufficiency.  Hemoglobin is back to normal at this time..   2.  Antiemetics: No nausea or vomiting reported.  Antiemetics are available to him.  4.  Renal failure: Continues to be hemodialysis dependent.  5.  Weakness and debilitation: Slow improvement noted at this time without any recent decline.  6.  Goals of care: Therapy overall remains palliative and aggressive measures are warranted however.  His disease remains incurable but his performance status is adequate continue with the current regimen.  7.  Hypotension: Resolved at this time with normal blood pressure today.  8. Follow-up: He will remain on maintenance therapy with weekly Velcade, monthly daratumumab and MD follow-up in 4 weeks.   30 minutes were dedicated to this visit.  The time was spent on updating his disease status, reviewing laboratory data, discussing treatment options and managing complications related to therapy.  Zola Button, MD 5/11/202111:57 AM

## 2020-01-18 ENCOUNTER — Ambulatory Visit: Payer: Self-pay

## 2020-01-18 ENCOUNTER — Telehealth: Payer: Self-pay

## 2020-01-18 DIAGNOSIS — N186 End stage renal disease: Secondary | ICD-10-CM

## 2020-01-18 DIAGNOSIS — E1122 Type 2 diabetes mellitus with diabetic chronic kidney disease: Secondary | ICD-10-CM

## 2020-01-18 DIAGNOSIS — E1129 Type 2 diabetes mellitus with other diabetic kidney complication: Secondary | ICD-10-CM | POA: Diagnosis not present

## 2020-01-18 DIAGNOSIS — C9000 Multiple myeloma not having achieved remission: Secondary | ICD-10-CM

## 2020-01-18 DIAGNOSIS — D509 Iron deficiency anemia, unspecified: Secondary | ICD-10-CM | POA: Diagnosis not present

## 2020-01-18 DIAGNOSIS — N2581 Secondary hyperparathyroidism of renal origin: Secondary | ICD-10-CM | POA: Diagnosis not present

## 2020-01-18 DIAGNOSIS — D631 Anemia in chronic kidney disease: Secondary | ICD-10-CM | POA: Diagnosis not present

## 2020-01-18 DIAGNOSIS — Z992 Dependence on renal dialysis: Secondary | ICD-10-CM | POA: Diagnosis not present

## 2020-01-19 ENCOUNTER — Ambulatory Visit (INDEPENDENT_AMBULATORY_CARE_PROVIDER_SITE_OTHER): Payer: Medicare Other

## 2020-01-19 ENCOUNTER — Ambulatory Visit: Payer: Medicare Other | Admitting: Neurology

## 2020-01-19 ENCOUNTER — Telehealth: Payer: Self-pay

## 2020-01-19 DIAGNOSIS — E1122 Type 2 diabetes mellitus with diabetic chronic kidney disease: Secondary | ICD-10-CM

## 2020-01-19 DIAGNOSIS — Z992 Dependence on renal dialysis: Secondary | ICD-10-CM

## 2020-01-19 DIAGNOSIS — C9000 Multiple myeloma not having achieved remission: Secondary | ICD-10-CM

## 2020-01-19 DIAGNOSIS — N186 End stage renal disease: Secondary | ICD-10-CM

## 2020-01-19 LAB — CBC AND DIFFERENTIAL
HCT: 37 — AB (ref 41–53)
Hemoglobin: 12.5 — AB (ref 13.5–17.5)
Neutrophils Absolute: 78
Platelets: 268 (ref 150–399)
WBC: 16.4

## 2020-01-19 LAB — BASIC METABOLIC PANEL
BUN: 61 — AB (ref 4–21)
Chloride: 97 — AB (ref 99–108)
Creatinine: 10.6 — AB (ref 0.6–1.3)
Potassium: 4.7 (ref 3.4–5.3)
Sodium: 143 (ref 137–147)

## 2020-01-19 LAB — COMPREHENSIVE METABOLIC PANEL: Calcium: 9.4 (ref 8.7–10.7)

## 2020-01-19 LAB — CBC: RBC: 3.7 — AB (ref 3.87–5.11)

## 2020-01-19 NOTE — Patient Instructions (Signed)
Social Worker Visit Information  Goals we discussed today:  Goals Addressed            This Visit's Progress   . Collaborate with RN Care Manager to perform apprpriate assessments to determine care management and care coordination needs       Current Barriers:  . Care management and care coordination needs related to DMII, ESRD, and multiple myeloma  which resulted in inpatient hospitalization . ADL IADL limitations  Clinical Social Work Clinical Goal(s):  [name] Over the next 60 days, patient will collaborate with care management team to assist with care coordination needs in response to recent inpatient hospitalization . New 01/16/20- Over the next 20 days the patient and his spouse will work with SW to identify cause of Medicaid denial and initiate an appeal  CCM SW Interventions: Completed 01/18/20 with [facility] . Collaboration with Alicia Amel from CAP/DA office whom reports contact with the State to determine a question was left blank by the patients provider which prompted a denial o Mrs. Karlene Lineman requests form be updated and faxed to her at [phone] for submission . Performed chart review to note original form has yet to be scanned into the patient record by on base team . Collaboration with [facility] to request copy of form be sent to SW to allow physician to complete and re-submit . Received form from [name], collaboration with provider office to confirm retrieval from printer . Collaboration with patients primary care team to confirm form was completed and faxed to Alicia Amel  . Communication with Mrs. Cherry to inform of form completion and submission to Mrs. Karlene Lineman . Collaboration with Mrs. Karlene Lineman to inform fax was sent and request confirmation the form was received . Scheduled follow up call over the next week  Patient Self Care Activities:  . Attends all scheduled provider appointments . Calls pharmacy for medication refills . Calls provider office for new concerns  or questions . Supportive wife and caregiver to assist with patient care needs   Please see past updates related to this goal by clicking on the "Past Updates" button in the selected goal        Follow Up Plan: SW will follow up with patient by phone over the next week.   [name], BSW, CDP Social Worker, Certified Dementia Practitioner [place] [facility] [place] [facility] [phone]

## 2020-01-19 NOTE — Chronic Care Management (AMB) (Signed)
Chronic Care Management    Social Work Follow Up Note  01/19/2020 Name: Isaiah [name] [name] MRN: [id number] DOB: February 29, 1966  Isaiah [name] is a 54 y.o. year old male who is a primary care patient of Isaiah Chard, MD. The CCM team was consulted for assistance with care coordination.   Review of patient status, including review of consultants reports, other relevant assessments, and collaboration with appropriate care team members and the patient's provider was performed as part of comprehensive patient evaluation and provision of chronic care management services.    SDOH (Social Determinants of Health) assessments performed: No    Outpatient Encounter Medications as of 01/19/2020  Medication Sig Note  . acetaminophen (TYLENOL) 325 MG tablet Take 1-2 tablets (325-650 mg total) by mouth every 4 (four) hours as needed for mild pain.   [name] acyclovir (ZOVIRAX) 400 MG tablet TAKE 1 TABLET (400 MG TOTAL) BY MOUTH DAILY.   [name] albuterol (PROAIR HFA) 108 (90 Base) MCG/ACT inhaler Inhale two puffs every 4-6 hours if needed for cough or wheeze   . Alcohol Swabs (SM ALCOHOL PREP) 70 % PADS    . AURYXIA 1 GM 210 MG(Fe) tablet Take 210 mg by mouth 3 (three) times daily.   [name] BREO ELLIPTA 200-25 MCG/INH AEPB INHALE 1 PUFF BY MOUTH AT THE SAME TIME EVERY DAY   . calcitRIOL (ROCALTROL) 0.25 MCG capsule Take by mouth 3 (three) times a week.   . calcium carbonate (TUMS - DOSED IN MG ELEMENTAL CALCIUM) 500 MG chewable tablet Chew 2 tablets by mouth at bedtime. 10/04/2019: On hold per MD  . camphor-menthol (SARNA) lotion Apply 1 application topically every 8 (eight) hours as needed for itching.   . Cetirizine HCl 10 MG CAPS Take 1 capsule by mouth daily.   . cinacalcet (SENSIPAR) 60 MG tablet Take 60 mg by mouth every evening. ON HOLD FOR DIALYSIS PER WIFE   . clonazePAM (KLONOPIN) 0.5 MG tablet Take 2 tablets every Monday, Wednesday, and Friday with hemodialysis   . DARZALEX FASPRO 1800-30000 MG-UT/15ML SOLN Inject 1     . dexamethasone (DECADRON) 4 MG tablet Take 5 tablets (20 mg total) by mouth once a week. Take 5 tablets (20 mg total) by mouth once a week on day of chemo.   [name] FREESTYLE LITE test strip    . hydrOXYzine (ATARAX/VISTARIL) 25 MG tablet Take 1 tablet (25 mg total) by mouth every 8 (eight) hours as needed for itching.   . Insulin Glargine (BASAGLAR KWIKPEN) 100 UNIT/ML 8 Units.    . Insulin Lispro (HUMALOG KWIKPEN [place]) Inject 2-4 Units into the skin 3 (three) times daily before meals. Sliding scale: 150-250 =2 units, 250 -350=3 units, 350 += 4 units (On Hold)   . lacosamide (VIMPAT) 200 MG TABS tablet Take 1 tablet twice a day except on dialysis days M-W-F give 1 tab in AM, 1 tab after dialysis, 1 tab in PM.   . lamoTRIgine (LAMICTAL) 200 MG tablet Take 1 tablet (200 mg total) by mouth 2 (two) times daily.   . Lancets (FREESTYLE) lancets    . Methoxy PEG-Epoetin Beta (MIRCERA IJ) Mircera   . midodrine (PROAMATINE) 5 MG tablet Take one tablet by mouth 30 minutes before dialysis.   [name] multivitamin (RENA-VIT) TABS tablet Take 1 tablet by mouth at bedtime.   . Nutritional Supplements (FEEDING SUPPLEMENT, NEPRO CARB STEADY,) LIQD Take 237 mLs by mouth 3 (three) times daily as needed (Supplement).   . pantoprazole (PROTONIX) 40 MG tablet TAKE 1  TABLET BY MOUTH ONCE DAILY   . prochlorperazine (COMPAZINE) 10 MG tablet TAKE 1 TABLET (10 MG TOTAL) BY MOUTH EVERY 6 (SIX) HOURS AS NEEDED FOR NAUSEA OR VOMITING.   [name] UNIFINE PENTIPS 31G X 8 MM MISC USE AS DIRECTED DAILY AFTER SUPPER    No facility-administered encounter medications on file as of 01/19/2020.     Goals Addressed            This Visit's Progress   . Collaborate with RN Care Manager to perform apprpriate assessments to determine care management and care coordination needs   On track    Current Barriers:  . Care management and care coordination needs related to DMII, ESRD, and multiple myeloma  which resulted in inpatient hospitalization . ADL  IADL limitations  Clinical Social Work Clinical Goal(s):  [name] Over the next 60 days, patient will collaborate with care management team to assist with care coordination needs in response to recent inpatient hospitalization . New 01/16/20- Over the next 20 days the patient and his spouse will work with SW to identify cause of Medicaid denial and initiate an appeal  CCM SW Interventions: Completed 01/19/20 . Collaboration with Alicia Amel, administrative assistant with CAP/DA program to confirm receipt of updated form "I did receive it and I just faxed it to the state office. I am sending an email to our contact person to let her know it was sent." . Successful outbound call placed to the patients spouse and caregiver, [facility] to provide an update on application status . Scheduled follow up call over the next 3 weeks to assess goal progression   Completed 01/18/20 with [name] . Collaboration with Alicia Amel from CAP/DA office whom reports contact with the State to determine a question was left blank by the patients provider which prompted a denial o Isaiah [name] requests form be updated and faxed to her at [phone] for submission . Performed chart review to note original form has yet to be scanned into the patient record by on base team . Collaboration with [facility] to request copy of form be sent to SW to allow physician to complete and re-submit . Received form from [name], collaboration with provider office to confirm retrieval from printer . Collaboration with patients primary care team to confirm form was completed and faxed to Alicia Amel  . Communication with Mrs. Ferch to inform of form completion and submission to Isaiah [name] . Collaboration with Isaiah [name] to inform fax was sent and request confirmation the form was received . Scheduled follow up call over the next week  Patient Self Care Activities:  . Attends all scheduled provider appointments . Calls pharmacy  for medication refills . Calls provider office for new concerns or questions . Supportive wife and caregiver to assist with patient care needs   Please see past updates related to this goal by clicking on the "Past Updates" button in the selected goal         Follow Up Plan: SW will follow up with patient by phone over the next three weeks.   [name], BSW, CDP Social Worker, Certified Dementia Practitioner [place] [facility] [place] [facility] [phone]  Total time spent performing care coordination and/or care management activities with the patient by phone or face to face =  7 minutes.

## 2020-01-19 NOTE — Patient Instructions (Signed)
Social Worker Visit Information  Goals we discussed today:  Goals Addressed            This Visit's Progress   . Collaborate with RN Care Manager to perform apprpriate assessments to determine care management and care coordination needs   On track    Current Barriers:  . Care management and care coordination needs related to DMII, ESRD, and multiple myeloma  which resulted in inpatient hospitalization . ADL IADL limitations  Clinical Social Work Clinical Goal(s):  [name] Over the next 60 days, patient will collaborate with care management team to assist with care coordination needs in response to recent inpatient hospitalization . New 01/16/20- Over the next 20 days the patient and his spouse will work with SW to identify cause of Medicaid denial and initiate an appeal  CCM SW Interventions: Completed 01/19/20 . Collaboration with Alicia Amel, administrative assistant with CAP/DA program to confirm receipt of updated form "I did receive it and I just faxed it to the state office. I am sending an email to our contact person to let her know it was sent." . Successful outbound call placed to the patients spouse and caregiver, [facility] to provide an update on application status . Scheduled follow up call over the next 3 weeks to assess goal progression   Completed 01/18/20 with [facility] . Collaboration with Alicia Amel from CAP/DA office whom reports contact with the State to determine a question was left blank by the patients provider which prompted a denial o Mrs. Karlene Lineman requests form be updated and faxed to her at [phone] for submission . Performed chart review to note original form has yet to be scanned into the patient record by on base team . Collaboration with [facility] to request copy of form be sent to SW to allow physician to complete and re-submit . Received form from [name], collaboration with provider office to confirm retrieval from printer . Collaboration with  patients primary care team to confirm form was completed and faxed to Alicia Amel  . Communication with Mrs. [place] to inform of form completion and submission to Mrs. Karlene Lineman . Collaboration with Mrs. Karlene Lineman to inform fax was sent and request confirmation the form was received . Scheduled follow up call over the next week  Patient Self Care Activities:  . Attends all scheduled provider appointments . Calls pharmacy for medication refills . Calls provider office for new concerns or questions . Supportive wife and caregiver to assist with patient care needs   Please see past updates related to this goal by clicking on the "Past Updates" button in the selected goal        Follow Up Plan: SW will follow up with patient by phone over the next three weeks. Please contact me sooner as needed.    [name], BSW, CDP Social Worker, Certified Dementia Practitioner [place] [facility] [place] [facility] [phone]

## 2020-01-19 NOTE — Chronic Care Management (AMB) (Signed)
Chronic Care Management    Social Work Follow Up Note  01/18/20 Name: Isaiah [name] [facility] MRN: [id number] DOB: [birth year]  Isaiah [name] is a 54 y.o. year old male who is a primary care patient of [name], MD. The CCM team was consulted for assistance with care coordination.   Review of patient status, including review of consultants reports, other relevant assessments, and collaboration with appropriate care team members and the patient's provider was performed as part of comprehensive patient evaluation and provision of chronic care management services.    SDOH (Social Determinants of Health) assessments performed: No    Outpatient Encounter Medications as of 01/18/2020  Medication Sig Note  . acetaminophen (TYLENOL) 325 MG tablet Take 1-2 tablets (325-650 mg total) by mouth every 4 (four) hours as needed for mild pain.   [name] acyclovir (ZOVIRAX) 400 MG tablet TAKE 1 TABLET (400 MG TOTAL) BY MOUTH DAILY.   [name] albuterol (PROAIR HFA) 108 (90 Base) MCG/ACT inhaler Inhale two puffs every 4-6 hours if needed for cough or wheeze   . Alcohol Swabs (SM ALCOHOL PREP) 70 % PADS    . AURYXIA 1 GM 210 MG(Fe) tablet Take 210 mg by mouth 3 (three) times daily.   [name] BREO ELLIPTA 200-25 MCG/INH AEPB INHALE 1 PUFF BY MOUTH AT THE SAME TIME EVERY DAY   . calcitRIOL (ROCALTROL) 0.25 MCG capsule Take by mouth 3 (three) times a week.   . calcium carbonate (TUMS - DOSED IN MG ELEMENTAL CALCIUM) 500 MG chewable tablet Chew 2 tablets by mouth at bedtime. 10/04/2019: On hold per MD  . camphor-menthol (SARNA) lotion Apply 1 application topically every 8 (eight) hours as needed for itching.   . Cetirizine HCl 10 MG CAPS Take 1 capsule by mouth daily.   . cinacalcet (SENSIPAR) 60 MG tablet Take 60 mg by mouth every evening. ON HOLD FOR DIALYSIS PER WIFE   . clonazePAM (KLONOPIN) 0.5 MG tablet Take 2 tablets every Monday, Wednesday, and Friday with hemodialysis   . DARZALEX FASPRO 1800-30000 MG-UT/15ML SOLN Inject 1     . dexamethasone (DECADRON) 4 MG tablet Take 5 tablets (20 mg total) by mouth once a week. Take 5 tablets (20 mg total) by mouth once a week on day of chemo.   [name] FREESTYLE LITE test strip    . hydrOXYzine (ATARAX/VISTARIL) 25 MG tablet Take 1 tablet (25 mg total) by mouth every 8 (eight) hours as needed for itching.   . Insulin Glargine (BASAGLAR KWIKPEN) 100 UNIT/ML 8 Units.    . Insulin Lispro (HUMALOG KWIKPEN [place]) Inject 2-4 Units into the skin 3 (three) times daily before meals. Sliding scale: 150-250 =2 units, 250 -350=3 units, 350 += 4 units (On Hold)   . lacosamide (VIMPAT) 200 MG TABS tablet Take 1 tablet twice a day except on dialysis days M-W-F give 1 tab in AM, 1 tab after dialysis, 1 tab in PM.   . lamoTRIgine (LAMICTAL) 200 MG tablet Take 1 tablet (200 mg total) by mouth 2 (two) times daily.   . Lancets (FREESTYLE) lancets    . Methoxy PEG-Epoetin Beta (MIRCERA IJ) Mircera   . midodrine (PROAMATINE) 5 MG tablet Take one tablet by mouth 30 minutes before dialysis.   [name] multivitamin (RENA-VIT) TABS tablet Take 1 tablet by mouth at bedtime.   . Nutritional Supplements (FEEDING SUPPLEMENT, NEPRO CARB STEADY,) LIQD Take 237 mLs by mouth 3 (three) times daily as needed (Supplement).   . pantoprazole (PROTONIX) 40 MG tablet TAKE 1  TABLET BY MOUTH ONCE DAILY   . prochlorperazine (COMPAZINE) 10 MG tablet TAKE 1 TABLET (10 MG TOTAL) BY MOUTH EVERY 6 (SIX) HOURS AS NEEDED FOR NAUSEA OR VOMITING.   [name] UNIFINE PENTIPS 31G X 8 MM MISC USE AS DIRECTED DAILY AFTER SUPPER    No facility-administered encounter medications on file as of 01/18/2020.     Goals Addressed            This Visit's Progress   . Collaborate with RN Care Manager to perform apprpriate assessments to determine care management and care coordination needs       Current Barriers:  . Care management and care coordination needs related to DMII, ESRD, and multiple myeloma  which resulted in inpatient hospitalization . ADL IADL  limitations  Clinical Social Work Clinical Goal(s):  [name] Over the next 60 days, patient will collaborate with care management team to assist with care coordination needs in response to recent inpatient hospitalization . New 01/16/20- Over the next 20 days the patient and his spouse will work with SW to identify cause of Medicaid denial and initiate an appeal  CCM SW Interventions: Completed 01/18/20 with [facility] . Collaboration with Alicia Amel from CAP/DA office whom reports contact with the State to determine a question was left blank by the patients provider which prompted a denial o Mrs. Karlene Lineman requests form be updated and faxed to her at [phone] for submission . Performed chart review to note original form has yet to be scanned into the patient record by on base team . Collaboration with [facility] to request copy of form be sent to SW to allow physician to complete and re-submit . Received form from [name], collaboration with provider office to confirm retrieval from printer . Collaboration with patients primary care team to confirm form was completed and faxed to Alicia Amel  . Communication with Mrs. Sultan to inform of form completion and submission to Mrs. Karlene Lineman . Collaboration with Mrs. Karlene Lineman to inform fax was sent and request confirmation the form was received . Scheduled follow up call over the next week  Patient Self Care Activities:  . Attends all scheduled provider appointments . Calls pharmacy for medication refills . Calls provider office for new concerns or questions . Supportive wife and caregiver to assist with patient care needs   Please see past updates related to this goal by clicking on the "Past Updates" button in the selected goal         Follow Up Plan: SW will follow up with patient by phone over the next week.   [name], BSW, CDP Social Worker, Certified Dementia Practitioner [place] [facility] [place] [facility] [phone]  Total time spent  performing care coordination and/or care management activities with the patient by phone or face to face = 18 minutes.

## 2020-01-20 ENCOUNTER — Telehealth: Payer: Self-pay

## 2020-01-20 DIAGNOSIS — D631 Anemia in chronic kidney disease: Secondary | ICD-10-CM | POA: Diagnosis not present

## 2020-01-20 DIAGNOSIS — Z992 Dependence on renal dialysis: Secondary | ICD-10-CM | POA: Diagnosis not present

## 2020-01-20 DIAGNOSIS — E1129 Type 2 diabetes mellitus with other diabetic kidney complication: Secondary | ICD-10-CM | POA: Diagnosis not present

## 2020-01-20 DIAGNOSIS — N2581 Secondary hyperparathyroidism of renal origin: Secondary | ICD-10-CM | POA: Diagnosis not present

## 2020-01-20 DIAGNOSIS — D509 Iron deficiency anemia, unspecified: Secondary | ICD-10-CM | POA: Diagnosis not present

## 2020-01-20 DIAGNOSIS — N186 End stage renal disease: Secondary | ICD-10-CM | POA: Diagnosis not present

## 2020-01-23 DIAGNOSIS — D509 Iron deficiency anemia, unspecified: Secondary | ICD-10-CM | POA: Diagnosis not present

## 2020-01-23 DIAGNOSIS — N2581 Secondary hyperparathyroidism of renal origin: Secondary | ICD-10-CM | POA: Diagnosis not present

## 2020-01-23 DIAGNOSIS — N186 End stage renal disease: Secondary | ICD-10-CM | POA: Diagnosis not present

## 2020-01-23 DIAGNOSIS — Z992 Dependence on renal dialysis: Secondary | ICD-10-CM | POA: Diagnosis not present

## 2020-01-23 DIAGNOSIS — E1129 Type 2 diabetes mellitus with other diabetic kidney complication: Secondary | ICD-10-CM | POA: Diagnosis not present

## 2020-01-23 DIAGNOSIS — D631 Anemia in chronic kidney disease: Secondary | ICD-10-CM | POA: Diagnosis not present

## 2020-01-24 ENCOUNTER — Inpatient Hospital Stay: Payer: Medicare Other

## 2020-01-24 ENCOUNTER — Other Ambulatory Visit: Payer: Self-pay

## 2020-01-24 VITALS — BP 117/72 | HR 81 | Temp 98.2°F | Resp 18

## 2020-01-24 DIAGNOSIS — R5381 Other malaise: Secondary | ICD-10-CM | POA: Diagnosis not present

## 2020-01-24 DIAGNOSIS — N19 Unspecified kidney failure: Secondary | ICD-10-CM | POA: Diagnosis not present

## 2020-01-24 DIAGNOSIS — E109 Type 1 diabetes mellitus without complications: Secondary | ICD-10-CM | POA: Diagnosis not present

## 2020-01-24 DIAGNOSIS — Z992 Dependence on renal dialysis: Secondary | ICD-10-CM | POA: Diagnosis not present

## 2020-01-24 DIAGNOSIS — R531 Weakness: Secondary | ICD-10-CM | POA: Diagnosis not present

## 2020-01-24 DIAGNOSIS — D6489 Other specified anemias: Secondary | ICD-10-CM | POA: Diagnosis not present

## 2020-01-24 DIAGNOSIS — C9000 Multiple myeloma not having achieved remission: Secondary | ICD-10-CM

## 2020-01-24 DIAGNOSIS — Z5112 Encounter for antineoplastic immunotherapy: Secondary | ICD-10-CM | POA: Diagnosis not present

## 2020-01-24 DIAGNOSIS — E11311 Type 2 diabetes mellitus with unspecified diabetic retinopathy with macular edema: Secondary | ICD-10-CM | POA: Diagnosis not present

## 2020-01-24 DIAGNOSIS — E119 Type 2 diabetes mellitus without complications: Secondary | ICD-10-CM | POA: Diagnosis not present

## 2020-01-24 DIAGNOSIS — H3582 Retinal ischemia: Secondary | ICD-10-CM | POA: Diagnosis not present

## 2020-01-24 LAB — CBC WITH DIFFERENTIAL (CANCER CENTER ONLY)
Abs Immature Granulocytes: 0 10*3/uL (ref 0.00–0.07)
Basophils Absolute: 0 10*3/uL (ref 0.0–0.1)
Basophils Relative: 0 %
Eosinophils Absolute: 0.3 10*3/uL (ref 0.0–0.5)
Eosinophils Relative: 3 %
HCT: 34.9 % — ABNORMAL LOW (ref 39.0–52.0)
Hemoglobin: 12.7 g/dL — ABNORMAL LOW (ref 13.0–17.0)
Lymphocytes Relative: 6 %
Lymphs Abs: 0.6 10*3/uL — ABNORMAL LOW (ref 0.7–4.0)
MCH: 32.6 pg (ref 26.0–34.0)
MCHC: 36.4 g/dL — ABNORMAL HIGH (ref 30.0–36.0)
MCV: 89.5 fL (ref 80.0–100.0)
Monocytes Absolute: 0.4 10*3/uL (ref 0.1–1.0)
Monocytes Relative: 4 %
Neutro Abs: 9 10*3/uL — ABNORMAL HIGH (ref 1.7–7.7)
Neutrophils Relative %: 87 %
Platelet Count: 285 10*3/uL (ref 150–400)
RBC: 3.9 MIL/uL — ABNORMAL LOW (ref 4.22–5.81)
RDW: 16.9 % — ABNORMAL HIGH (ref 11.5–15.5)
WBC Count: 10.4 10*3/uL (ref 4.0–10.5)
nRBC: 14.8 % — ABNORMAL HIGH (ref 0.0–0.2)

## 2020-01-24 LAB — CMP (CANCER CENTER ONLY)
ALT: 17 U/L (ref 0–44)
AST: 14 U/L — ABNORMAL LOW (ref 15–41)
Albumin: 3.9 g/dL (ref 3.5–5.0)
Alkaline Phosphatase: 144 U/L — ABNORMAL HIGH (ref 38–126)
Anion gap: 17 — ABNORMAL HIGH (ref 5–15)
BUN: 31 mg/dL — ABNORMAL HIGH (ref 6–20)
CO2: 31 mmol/L (ref 22–32)
Calcium: 9.3 mg/dL (ref 8.9–10.3)
Chloride: 95 mmol/L — ABNORMAL LOW (ref 98–111)
Creatinine: 7.67 mg/dL (ref 0.61–1.24)
GFR, Est AFR Am: 8 mL/min — ABNORMAL LOW (ref >60)
GFR, Estimated: 7 mL/min — ABNORMAL LOW (ref >60)
Glucose, Bld: 138 mg/dL — ABNORMAL HIGH (ref 70–99)
Potassium: 4.5 mmol/L (ref 3.5–5.1)
Sodium: 143 mmol/L (ref 135–145)
Total Bilirubin: 0.8 mg/dL (ref 0.3–1.2)
Total Protein: 7.1 g/dL (ref 6.5–8.1)

## 2020-01-24 MED ORDER — PROCHLORPERAZINE MALEATE 10 MG PO TABS
ORAL_TABLET | ORAL | Status: AC
  Filled 2020-01-24: qty 1

## 2020-01-24 MED ORDER — PROCHLORPERAZINE MALEATE 10 MG PO TABS
10.0000 mg | ORAL_TABLET | Freq: Once | ORAL | Status: AC
  Administered 2020-01-24: 10 mg via ORAL

## 2020-01-24 MED ORDER — BORTEZOMIB CHEMO SQ INJECTION 3.5 MG (2.5MG/ML)
1.5000 mg/m2 | Freq: Once | INTRAMUSCULAR | Status: AC
  Administered 2020-01-24: 3 mg via SUBCUTANEOUS
  Filled 2020-01-24: qty 1.2

## 2020-01-24 NOTE — Patient Instructions (Signed)
[facility] Discharge Instructions for Patients Receiving Chemotherapy  Today you received the following chemotherapy agent: Bortezomib (Velcade)  To help prevent nausea and vomiting after your treatment, we encourage you to take your nausea medication as directed by your MD.   If you develop nausea and vomiting that is not controlled by your nausea medication, call the clinic.   BELOW ARE SYMPTOMS THAT SHOULD BE REPORTED IMMEDIATELY:  *FEVER GREATER THAN 100.5 F  *CHILLS WITH OR WITHOUT FEVER  NAUSEA AND VOMITING THAT IS NOT CONTROLLED WITH YOUR NAUSEA MEDICATION  *UNUSUAL SHORTNESS OF BREATH  *UNUSUAL BRUISING OR BLEEDING  TENDERNESS IN MOUTH AND THROAT WITH OR WITHOUT PRESENCE OF ULCERS  *URINARY PROBLEMS  *BOWEL PROBLEMS  UNUSUAL RASH Items with * indicate a potential emergency and should be followed up as soon as possible.  Feel free to call the clinic should you have any questions or concerns. The clinic phone number is [phone].  Please show the [facility] at check-in to the Emergency Department and triage nurse.  Coronavirus (COVID-19) Are you at risk?  Are you at risk for the Coronavirus (COVID-19)?  To be considered HIGH RISK for Coronavirus (COVID-19), you have to meet the following criteria:  . Traveled to [place], [place], [place], [place] or [place]; or in the [place] to [place], [place], [place], or [place]; and have fever, cough, and shortness of breath within the last 2 weeks of travel OR . Been in close contact with a person diagnosed with COVID-19 within the last 2 weeks and have fever, cough, and shortness of breath . IF YOU DO NOT MEET THESE CRITERIA, YOU ARE CONSIDERED LOW RISK FOR COVID-19.  What to do if you are HIGH RISK for COVID-19?  [name] If you are having a medical emergency, call 911. . Seek medical care right away. Before you go to a doctor's office, urgent care or emergency department, call ahead and tell  them about your recent travel, contact with someone diagnosed with COVID-19, and your symptoms. You should receive instructions from your physician's office regarding next steps of care.  . When you arrive at healthcare provider, tell the healthcare staff immediately you have returned from visiting [place], [place], [place], [place] or [place]; or traveled in the [place] to [place], [place], [place], or [place]; in the last two weeks or you have been in close contact with a person diagnosed with COVID-19 in the last 2 weeks.   . Tell the health care staff about your symptoms: fever, cough and shortness of breath. . After you have been seen by a medical provider, you will be either: o Tested for (COVID-19) and discharged home on quarantine except to seek medical care if symptoms worsen, and asked to  - Stay home and avoid contact with others until you get your results (4-5 days)  - Avoid travel on public transportation if possible (such as bus, train, or airplane) or o Sent to the Emergency Department by EMS for evaluation, COVID-19 testing, and possible admission depending on your condition and test results.  What to do if you are LOW RISK for COVID-19?  Reduce your risk of any infection by using the same precautions used for avoiding the common cold or flu:  [name] Wash your hands often with soap and warm water for at least 20 seconds.  If soap and water are not readily available, use an alcohol-based hand sanitizer with at least 60% alcohol.  [name]  If coughing or sneezing, cover your mouth and nose by coughing or sneezing into the elbow areas of your shirt or coat, into a tissue or into your sleeve (not your hands). . Avoid shaking hands with others and consider head nods or verbal greetings only. . Avoid touching your eyes, nose, or mouth with unwashed hands.  . Avoid close contact with people who are sick. . Avoid places or events with large numbers of people in one location, like concerts or  sporting events. . Carefully consider travel plans you have or are making. . If you are planning any travel outside or inside the [place], visit the CDC's Travelers' Health webpage for the latest health notices. . If you have some symptoms but not all symptoms, continue to monitor at home and seek medical attention if your symptoms worsen. . If you are having a medical emergency, call 911.   [place] / e-Visit: [email or web address]         [facility]: [facility]: [phone]                   [facility]: [phone]

## 2020-01-25 ENCOUNTER — Ambulatory Visit: Payer: Self-pay

## 2020-01-25 DIAGNOSIS — N186 End stage renal disease: Secondary | ICD-10-CM | POA: Diagnosis not present

## 2020-01-25 DIAGNOSIS — E1129 Type 2 diabetes mellitus with other diabetic kidney complication: Secondary | ICD-10-CM | POA: Diagnosis not present

## 2020-01-25 DIAGNOSIS — Z992 Dependence on renal dialysis: Secondary | ICD-10-CM

## 2020-01-25 DIAGNOSIS — C9000 Multiple myeloma not having achieved remission: Secondary | ICD-10-CM

## 2020-01-25 DIAGNOSIS — D509 Iron deficiency anemia, unspecified: Secondary | ICD-10-CM | POA: Diagnosis not present

## 2020-01-25 DIAGNOSIS — E1122 Type 2 diabetes mellitus with diabetic chronic kidney disease: Secondary | ICD-10-CM | POA: Diagnosis not present

## 2020-01-25 DIAGNOSIS — N2581 Secondary hyperparathyroidism of renal origin: Secondary | ICD-10-CM | POA: Diagnosis not present

## 2020-01-25 DIAGNOSIS — D631 Anemia in chronic kidney disease: Secondary | ICD-10-CM | POA: Diagnosis not present

## 2020-01-25 NOTE — Progress Notes (Signed)
Pharmacist Chemotherapy Monitoring - Follow Up Assessment    I verify that I have reviewed each item in the below checklist:  . Regimen for the patient is scheduled for the appropriate day and plan matches scheduled date. [name] Appropriate non-routine labs are ordered dependent on drug ordered. . If applicable, additional medications reviewed and ordered per protocol based on lifetime cumulative doses and/or treatment regimen.   Plan for follow-up and/or issues identified: No . I-vent associated with next due treatment: Yes . MD and/or nursing notified: No   Kennith Center, Pharm.D., CPP 01/25/2020@2 :54 PM

## 2020-01-26 ENCOUNTER — Telehealth: Payer: Self-pay

## 2020-01-26 ENCOUNTER — Other Ambulatory Visit: Payer: Self-pay

## 2020-01-26 MED ORDER — ALBUTEROL SULFATE HFA 108 (90 BASE) MCG/ACT IN AERS: INHALATION_SPRAY | RESPIRATORY_TRACT | 2 refills | Status: AC

## 2020-01-26 MED FILL — ALBUTEROL SULFATE HFA 108 (: 108 (90 BAS | 25 days supply | Qty: 18 | Fill #0

## 2020-01-26 NOTE — Chronic Care Management (AMB) (Signed)
Chronic Care Management    Social Work Follow Up Note  01/25/20 Name: Isaiah [name] [facility] MRN: [id number] DOB: 1966-03-29  Isaiah [name] is a 54 y.o. year old male who is a primary care patient of [name], MD. The CCM team was consulted for assistance with care coordination.   Review of patient status, including review of consultants reports, other relevant assessments, and collaboration with appropriate care team members and the patient's provider was performed as part of comprehensive patient evaluation and provision of chronic care management services.    SDOH (Social Determinants of Health) assessments performed: No    Outpatient Encounter Medications as of 01/25/2020  Medication Sig Note  . acetaminophen (TYLENOL) 325 MG tablet Take 1-2 tablets (325-650 mg total) by mouth every 4 (four) hours as needed for mild pain.   [name] acyclovir (ZOVIRAX) 400 MG tablet TAKE 1 TABLET (400 MG TOTAL) BY MOUTH DAILY.   [name] albuterol (PROAIR HFA) 108 (90 Base) MCG/ACT inhaler Inhale two puffs every 4-6 hours if needed for cough or wheeze   . Alcohol Swabs (SM ALCOHOL PREP) 70 % PADS    . AURYXIA 1 GM 210 MG(Fe) tablet Take 210 mg by mouth 3 (three) times daily.   [name] BREO ELLIPTA 200-25 MCG/INH AEPB INHALE 1 PUFF BY MOUTH AT THE SAME TIME EVERY DAY   . calcitRIOL (ROCALTROL) 0.25 MCG capsule Take by mouth 3 (three) times a week.   . calcium carbonate (TUMS - DOSED IN MG ELEMENTAL CALCIUM) 500 MG chewable tablet Chew 2 tablets by mouth at bedtime. 10/04/2019: On hold per MD  . camphor-menthol (SARNA) lotion Apply 1 application topically every 8 (eight) hours as needed for itching.   . Cetirizine HCl 10 MG CAPS Take 1 capsule by mouth daily.   . cinacalcet (SENSIPAR) 60 MG tablet Take 60 mg by mouth every evening. ON HOLD FOR DIALYSIS PER WIFE   . clonazePAM (KLONOPIN) 0.5 MG tablet Take 2 tablets every Monday, Wednesday, and Friday with hemodialysis   . DARZALEX FASPRO 1800-30000 MG-UT/15ML SOLN Inject 1     . dexamethasone (DECADRON) 4 MG tablet Take 5 tablets (20 mg total) by mouth once a week. Take 5 tablets (20 mg total) by mouth once a week on day of chemo.   [name] FREESTYLE LITE test strip    . hydrOXYzine (ATARAX/VISTARIL) 25 MG tablet Take 1 tablet (25 mg total) by mouth every 8 (eight) hours as needed for itching.   . Insulin Glargine (BASAGLAR KWIKPEN) 100 UNIT/ML 8 Units.    . Insulin Lispro (HUMALOG KWIKPEN [place]) Inject 2-4 Units into the skin 3 (three) times daily before meals. Sliding scale: 150-250 =2 units, 250 -350=3 units, 350 += 4 units (On Hold)   . lacosamide (VIMPAT) 200 MG TABS tablet Take 1 tablet twice a day except on dialysis days M-W-F give 1 tab in AM, 1 tab after dialysis, 1 tab in PM.   . lamoTRIgine (LAMICTAL) 200 MG tablet Take 1 tablet (200 mg total) by mouth 2 (two) times daily.   . Lancets (FREESTYLE) lancets    . Methoxy PEG-Epoetin Beta (MIRCERA IJ) Mircera   . midodrine (PROAMATINE) 5 MG tablet Take one tablet by mouth 30 minutes before dialysis.   [name] multivitamin (RENA-VIT) TABS tablet Take 1 tablet by mouth at bedtime.   . Nutritional Supplements (FEEDING SUPPLEMENT, NEPRO CARB STEADY,) LIQD Take 237 mLs by mouth 3 (three) times daily as needed (Supplement).   . pantoprazole (PROTONIX) 40 MG tablet TAKE 1  TABLET BY MOUTH ONCE DAILY   . prochlorperazine (COMPAZINE) 10 MG tablet TAKE 1 TABLET (10 MG TOTAL) BY MOUTH EVERY 6 (SIX) HOURS AS NEEDED FOR NAUSEA OR VOMITING.   [name] UNIFINE PENTIPS 31G X 8 MM MISC USE AS DIRECTED DAILY AFTER SUPPER    No facility-administered encounter medications on file as of 01/25/2020.     Goals Addressed            This Visit's Progress   . Collaborate with RN Care Manager to perform apprpriate assessments to determine care management and care coordination needs       Current Barriers:  . Care management and care coordination needs related to DMII, ESRD, and multiple myeloma  which resulted in inpatient hospitalization . ADL IADL  limitations  Clinical Social Work Clinical Goal(s):  [name] Over the next 60 days, patient will collaborate with care management team to assist with care coordination needs in response to recent inpatient hospitalization . New 01/16/20- Over the next 20 days the patient and his spouse will work with SW to identify cause of Medicaid denial and initiate an appeal Goal Met . New 01/25/20 Over the next 60 days the patient and his wife will work with SW to address ongoing care coordination needs related to caregiver assistance  CCM SW Interventions: Completed 01/25/20 . Inbound call received from [facility] whom reports receiving a call from a Medicaid Supervisor on Monday 5/17 who discussed the need to factor in Mrs. Ki income and assets with the patients Medicaid application o Mrs. Haffey reports she withdrew the application due to her income placing the couple over the income limit o Advised Mrs. [facility] SW plan to follow up with Alicia Amel as the CAP program only looks at individual income and not joint . Collaboration with Alicia Amel to review Mrs. [facility] application withdrawal o Mrs. Karlene Lineman reports this withdrawal has not impacted the patients CAP application process o Mrs. Karlene Lineman will contact the Medicaid supervisor to re-instate the application as the patient is applying for CAP Medicaid and it should only look at his individual income . Inbound call received from Alicia Amel who reports she has re-instated the patients Medicaid application to be processed for CAP Medicaid . Successful outbound call placed to Mrs. Pepitone to advise of above interventions  Completed 01/19/20 . Collaboration with Alicia Amel, administrative assistant with CAP/DA program to confirm receipt of updated form "I did receive it and I just faxed it to the state office. I am sending an email to our contact person to let her know it was sent." . Successful outbound call placed to the patients spouse and caregiver,  [facility] to provide an update on application status . Scheduled follow up call over the next 3 weeks to assess goal progression   Completed 01/18/20 with [facility] . Collaboration with Alicia Amel from CAP/DA office whom reports contact with the State to determine a question was left blank by the patients provider which prompted a denial o Mrs. Karlene Lineman requests form be updated and faxed to her at [phone] for submission . Performed chart review to note original form has yet to be scanned into the patient record by on base team . Collaboration with [facility] to request copy of form be sent to SW to allow physician to complete and re-submit . Received form from [name], collaboration with provider office to confirm retrieval from printer . Collaboration with patients primary care team to confirm form was completed and  faxed to Alicia Amel  . Communication with Mrs. Wardle to inform of form completion and submission to Mrs. Karlene Lineman . Collaboration with Mrs. Karlene Lineman to inform fax was sent and request confirmation the form was received . Scheduled follow up call over the next week  Patient Self Care Activities:  . Attends all scheduled provider appointments . Calls pharmacy for medication refills . Calls provider office for new concerns or questions . Supportive wife and caregiver to assist with patient care needs   Please see past updates related to this goal by clicking on the "Past Updates" button in the selected goal         Follow Up Plan: SW will follow up with patient by phone over the next month.   [name], BSW, CDP Social Worker, Certified Dementia Practitioner [place] [facility] [place] [facility] [phone]  Total time spent performing care coordination and/or care management activities with the patient by phone or face to face = 17 minutes.

## 2020-01-26 NOTE — Patient Instructions (Signed)
Social Worker Visit Information  Goals we discussed today:  Goals Addressed            This Visit's Progress   . Collaborate with RN Care Manager to perform apprpriate assessments to determine care management and care coordination needs       Current Barriers:  . Care management and care coordination needs related to DMII, ESRD, and multiple myeloma  which resulted in inpatient hospitalization . ADL IADL limitations  Clinical Social Work Clinical Goal(s):  . Over the next 60 days, patient will collaborate with care management team to assist with care coordination needs in response to recent inpatient hospitalization . New 01/16/20- Over the next 20 days the patient and his spouse will work with SW to identify cause of Medicaid denial and initiate an appeal Goal Met . New 01/25/20 Over the next 60 days the patient and his wife will work with SW to address ongoing care coordination needs related to caregiver assistance  CCM SW Interventions: Completed 01/25/20 . Inbound call received from [name] whom reports receiving a call from a Medicaid Supervisor on Monday 5/17 who discussed the need to factor in Mrs. Morning income and assets with the patients Medicaid application o Mrs. Duford reports she withdrew the application due to her income placing the couple over the income limit o Advised Mrs. Laury SW plan to follow up with [name] as the CAP program only looks at individual income and not joint . Collaboration with [name] to review Mrs. Winton Medicaid application withdrawal o [name] reports this withdrawal has not impacted the patients CAP application process o [name] will contact the Medicaid supervisor to re-instate the application as the patient is applying for CAP Medicaid and it should only look at his individual income . Inbound call received from [name] who reports she has re-instated the patients Medicaid application to be processed for CAP  Medicaid . Successful outbound call placed to Mrs. Baumgart to advise of above interventions  Completed 01/19/20 . Collaboration with [name], administrative assistant with CAP/DA program to confirm receipt of updated form "I did receive it and I just faxed it to the state office. I am sending an email to our contact person to let her know it was sent." . Successful outbound call placed to the patients spouse and caregiver, Sylvia Hippe to provide an update on application status . Scheduled follow up call over the next 3 weeks to assess goal progression   Completed 01/18/20 with [facility] . Collaboration with [name] from CAP/DA office whom reports contact with the State to determine a question was left blank by the patients provider which prompted a denial o [name] requests form be updated and faxed to her at [phone] for submission . Performed chart review to note original form has yet to be scanned into the patient record by on base team . Collaboration with Sylvia Summerlin to request copy of form be sent to SW to allow physician to complete and re-submit . Received form from Mrs. Lieber, collaboration with provider office to confirm retrieval from printer . Collaboration with patients primary care team to confirm form was completed and faxed to [name]  . Communication with Mrs. Danh to inform of form completion and submission to [name] . Collaboration with [name] to inform fax was sent and request confirmation the form was received . Scheduled follow up call over the next week  Patient Self Care Activities:  . Attends   all scheduled provider appointments . Calls pharmacy for medication refills . Calls provider office for new concerns or questions . Supportive wife and caregiver to assist with patient care needs   Please see past updates related to this goal by clicking on the "Past Updates" button in the selected goal         Materials Provided: Verbal  education about CAP Medicaid provided by phone  Follow Up Plan: SW will follow up with patient by phone over the next month.   [name], BSW, CDP Social Worker, Certified Dementia Practitioner [place] [facility] [place] [facility] [phone]

## 2020-01-26 NOTE — Telephone Encounter (Signed)
Isaiah [name] was asked how is Isaiah [name]'s breathing, is he using Breo, does he has any issues, and does he have a rescue inhaler and she said that his breathing is good, yes he uses Breo, yes he has a rescue inhaler in Isaiah only issue that he has had is that he has had 2 blank seizures this month and 3 last month. He has seen his seizure doctor Dr. Delice Lesch May 6th and Isaiah doctor isn't going to increase his seizure med because it's already high and if higher it can cause issues.  Isaiah [name] is is going to rehab to be assesed to see if he is going to do home physical therapy or physical therapy at [facility] facility.

## 2020-01-26 NOTE — Telephone Encounter (Signed)
Dr. Baird Cancer wants to know if the pt is using Breo and if so is he having any issues and does he have a rescue inhaler.

## 2020-01-27 DIAGNOSIS — Z992 Dependence on renal dialysis: Secondary | ICD-10-CM | POA: Diagnosis not present

## 2020-01-27 DIAGNOSIS — N2581 Secondary hyperparathyroidism of renal origin: Secondary | ICD-10-CM | POA: Diagnosis not present

## 2020-01-27 DIAGNOSIS — D631 Anemia in chronic kidney disease: Secondary | ICD-10-CM | POA: Diagnosis not present

## 2020-01-27 DIAGNOSIS — E1129 Type 2 diabetes mellitus with other diabetic kidney complication: Secondary | ICD-10-CM | POA: Diagnosis not present

## 2020-01-27 DIAGNOSIS — N186 End stage renal disease: Secondary | ICD-10-CM | POA: Diagnosis not present

## 2020-01-27 DIAGNOSIS — D509 Iron deficiency anemia, unspecified: Secondary | ICD-10-CM | POA: Diagnosis not present

## 2020-01-30 DIAGNOSIS — Z992 Dependence on renal dialysis: Secondary | ICD-10-CM | POA: Diagnosis not present

## 2020-01-30 DIAGNOSIS — D509 Iron deficiency anemia, unspecified: Secondary | ICD-10-CM | POA: Diagnosis not present

## 2020-01-30 DIAGNOSIS — D631 Anemia in chronic kidney disease: Secondary | ICD-10-CM | POA: Diagnosis not present

## 2020-01-30 DIAGNOSIS — N2581 Secondary hyperparathyroidism of renal origin: Secondary | ICD-10-CM | POA: Diagnosis not present

## 2020-01-30 DIAGNOSIS — N186 End stage renal disease: Secondary | ICD-10-CM | POA: Diagnosis not present

## 2020-01-30 DIAGNOSIS — E1129 Type 2 diabetes mellitus with other diabetic kidney complication: Secondary | ICD-10-CM | POA: Diagnosis not present

## 2020-01-30 MED FILL — VIMPAT 200 MG TABLET: 200 | 37 days supply | Qty: 90 | Fill #0

## 2020-01-31 ENCOUNTER — Inpatient Hospital Stay: Payer: Medicare Other

## 2020-01-31 ENCOUNTER — Other Ambulatory Visit: Payer: Self-pay

## 2020-01-31 ENCOUNTER — Telehealth: Payer: Self-pay

## 2020-01-31 VITALS — BP 127/71 | HR 80 | Temp 98.2°F | Resp 18 | Wt 184.0 lb

## 2020-01-31 DIAGNOSIS — C9000 Multiple myeloma not having achieved remission: Secondary | ICD-10-CM | POA: Diagnosis not present

## 2020-01-31 DIAGNOSIS — R531 Weakness: Secondary | ICD-10-CM | POA: Diagnosis not present

## 2020-01-31 DIAGNOSIS — D6489 Other specified anemias: Secondary | ICD-10-CM | POA: Diagnosis not present

## 2020-01-31 DIAGNOSIS — Z992 Dependence on renal dialysis: Secondary | ICD-10-CM | POA: Diagnosis not present

## 2020-01-31 DIAGNOSIS — Z5112 Encounter for antineoplastic immunotherapy: Secondary | ICD-10-CM | POA: Diagnosis not present

## 2020-01-31 DIAGNOSIS — R5381 Other malaise: Secondary | ICD-10-CM | POA: Diagnosis not present

## 2020-01-31 DIAGNOSIS — N19 Unspecified kidney failure: Secondary | ICD-10-CM | POA: Diagnosis not present

## 2020-01-31 LAB — CBC WITH DIFFERENTIAL (CANCER CENTER ONLY)
Abs Immature Granulocytes: 0.04 10*3/uL (ref 0.00–0.07)
Basophils Absolute: 0.1 10*3/uL (ref 0.0–0.1)
Basophils Relative: 1 %
Eosinophils Absolute: 0.1 10*3/uL (ref 0.0–0.5)
Eosinophils Relative: 1 %
HCT: 34.4 % — ABNORMAL LOW (ref 39.0–52.0)
Hemoglobin: 12.7 g/dL — ABNORMAL LOW (ref 13.0–17.0)
Immature Granulocytes: 0 %
Lymphocytes Relative: 1 %
Lymphs Abs: 0.1 10*3/uL — ABNORMAL LOW (ref 0.7–4.0)
MCH: 32.5 pg (ref 26.0–34.0)
MCHC: 36.9 g/dL — ABNORMAL HIGH (ref 30.0–36.0)
MCV: 88 fL (ref 80.0–100.0)
Monocytes Absolute: 0.3 10*3/uL (ref 0.1–1.0)
Monocytes Relative: 3 %
Neutro Abs: 10.8 10*3/uL — ABNORMAL HIGH (ref 1.7–7.7)
Neutrophils Relative %: 94 %
Platelet Count: 313 10*3/uL (ref 150–400)
RBC: 3.91 MIL/uL — ABNORMAL LOW (ref 4.22–5.81)
RDW: 17.1 % — ABNORMAL HIGH (ref 11.5–15.5)
WBC Count: 11.5 10*3/uL — ABNORMAL HIGH (ref 4.0–10.5)
nRBC: 20.3 % — ABNORMAL HIGH (ref 0.0–0.2)

## 2020-01-31 LAB — CMP (CANCER CENTER ONLY)
ALT: 33 U/L (ref 0–44)
AST: 22 U/L (ref 15–41)
Albumin: 3.9 g/dL (ref 3.5–5.0)
Alkaline Phosphatase: 156 U/L — ABNORMAL HIGH (ref 38–126)
Anion gap: 13 (ref 5–15)
BUN: 34 mg/dL — ABNORMAL HIGH (ref 6–20)
CO2: 33 mmol/L — ABNORMAL HIGH (ref 22–32)
Calcium: 8.9 mg/dL (ref 8.9–10.3)
Chloride: 94 mmol/L — ABNORMAL LOW (ref 98–111)
Creatinine: 8.03 mg/dL (ref 0.61–1.24)
GFR, Est AFR Am: 8 mL/min — ABNORMAL LOW (ref >60)
GFR, Estimated: 7 mL/min — ABNORMAL LOW (ref >60)
Glucose, Bld: 224 mg/dL — ABNORMAL HIGH (ref 70–99)
Potassium: 4.7 mmol/L (ref 3.5–5.1)
Sodium: 140 mmol/L (ref 135–145)
Total Bilirubin: 0.8 mg/dL (ref 0.3–1.2)
Total Protein: 7 g/dL (ref 6.5–8.1)

## 2020-01-31 MED ORDER — ACETAMINOPHEN 325 MG PO TABS
650.0000 mg | ORAL_TABLET | Freq: Once | ORAL | Status: AC
  Administered 2020-01-31: 650 mg via ORAL

## 2020-01-31 MED ORDER — DARATUMUMAB-HYALURONIDASE-FIHJ 1800-30000 MG-UT/15ML ~~LOC~~ SOLN
1800.0000 mg | Freq: Once | SUBCUTANEOUS | Status: AC
  Administered 2020-01-31: 1800 mg via SUBCUTANEOUS
  Filled 2020-01-31: qty 15

## 2020-01-31 MED ORDER — PROCHLORPERAZINE MALEATE 10 MG PO TABS
ORAL_TABLET | ORAL | Status: AC
  Filled 2020-01-31: qty 1

## 2020-01-31 MED ORDER — PROCHLORPERAZINE MALEATE 10 MG PO TABS
10.0000 mg | ORAL_TABLET | Freq: Once | ORAL | Status: AC
  Administered 2020-01-31: 10 mg via ORAL

## 2020-01-31 MED ORDER — BORTEZOMIB CHEMO SQ INJECTION 3.5 MG (2.5MG/ML)
1.5000 mg/m2 | Freq: Once | INTRAMUSCULAR | Status: AC
  Administered 2020-01-31: 3 mg via SUBCUTANEOUS
  Filled 2020-01-31: qty 1.2

## 2020-01-31 MED ORDER — DIPHENHYDRAMINE HCL 25 MG PO CAPS
50.0000 mg | ORAL_CAPSULE | Freq: Once | ORAL | Status: AC
  Administered 2020-01-31: 50 mg via ORAL

## 2020-01-31 MED ORDER — DIPHENHYDRAMINE HCL 25 MG PO CAPS
ORAL_CAPSULE | ORAL | Status: AC
  Filled 2020-01-31: qty 2

## 2020-01-31 MED ORDER — ACETAMINOPHEN 325 MG PO TABS
ORAL_TABLET | ORAL | Status: AC
  Filled 2020-01-31: qty 2

## 2020-01-31 NOTE — Progress Notes (Signed)
Pharmacist Chemotherapy Monitoring - Follow Up Assessment    I verify that I have reviewed each item in the below checklist:  . Regimen for the patient is scheduled for the appropriate day and plan matches scheduled date. [name] Appropriate non-routine labs are ordered dependent on drug ordered. . If applicable, additional medications reviewed and ordered per protocol based on lifetime cumulative doses and/or treatment regimen.   Plan for follow-up and/or issues identified: No . I-vent associated with next due treatment: No . MD and/or nursing notified: No  Isaiah [name] D 01/31/2020 3:18 PM

## 2020-01-31 NOTE — Patient Instructions (Signed)
[place] Discharge Instructions for Patients Receiving Chemotherapy  Today you received the following chemotherapy agents: Darzalex Faspro  To help prevent nausea and vomiting after your treatment, we encourage you to take your nausea medication as directed.    If you develop nausea and vomiting that is not controlled by your nausea medication, call the clinic.   BELOW ARE SYMPTOMS THAT SHOULD BE REPORTED IMMEDIATELY:  *FEVER GREATER THAN 100.5 F  *CHILLS WITH OR WITHOUT FEVER  NAUSEA AND VOMITING THAT IS NOT CONTROLLED WITH YOUR NAUSEA MEDICATION  *UNUSUAL SHORTNESS OF BREATH  *UNUSUAL BRUISING OR BLEEDING  TENDERNESS IN MOUTH AND THROAT WITH OR WITHOUT PRESENCE OF ULCERS  *URINARY PROBLEMS  *BOWEL PROBLEMS  UNUSUAL RASH Items with * indicate a potential emergency and should be followed up as soon as possible.  Feel free to call the clinic should you have any questions or concerns. The clinic phone number is [phone].  Please show the [place] at check-in to the Emergency Department and triage nurse.

## 2020-01-31 NOTE — Telephone Encounter (Signed)
CRITICAL VALUE STICKER  CRITICAL VALUE: Creatinine 8.03  RECEIVER (on-site recipient of call): Lenox Ponds LPN  DATE & TIME NOTIFIED: 01/31/20 1125  MESSENGER (representative from lab): Ihor Gully RN  MD NOTIFIED: Dr. Alen Blew  TIME OF NOTIFICATION: 1127  RESPONSE: No further instruction

## 2020-02-01 DIAGNOSIS — N2581 Secondary hyperparathyroidism of renal origin: Secondary | ICD-10-CM | POA: Diagnosis not present

## 2020-02-01 DIAGNOSIS — E1129 Type 2 diabetes mellitus with other diabetic kidney complication: Secondary | ICD-10-CM | POA: Diagnosis not present

## 2020-02-01 DIAGNOSIS — N186 End stage renal disease: Secondary | ICD-10-CM | POA: Diagnosis not present

## 2020-02-01 DIAGNOSIS — Z992 Dependence on renal dialysis: Secondary | ICD-10-CM | POA: Diagnosis not present

## 2020-02-01 DIAGNOSIS — D631 Anemia in chronic kidney disease: Secondary | ICD-10-CM | POA: Diagnosis not present

## 2020-02-01 DIAGNOSIS — D509 Iron deficiency anemia, unspecified: Secondary | ICD-10-CM | POA: Diagnosis not present

## 2020-02-03 DIAGNOSIS — Z992 Dependence on renal dialysis: Secondary | ICD-10-CM | POA: Diagnosis not present

## 2020-02-03 DIAGNOSIS — D631 Anemia in chronic kidney disease: Secondary | ICD-10-CM | POA: Diagnosis not present

## 2020-02-03 DIAGNOSIS — E1129 Type 2 diabetes mellitus with other diabetic kidney complication: Secondary | ICD-10-CM | POA: Diagnosis not present

## 2020-02-03 DIAGNOSIS — D509 Iron deficiency anemia, unspecified: Secondary | ICD-10-CM | POA: Diagnosis not present

## 2020-02-03 DIAGNOSIS — N2581 Secondary hyperparathyroidism of renal origin: Secondary | ICD-10-CM | POA: Diagnosis not present

## 2020-02-03 DIAGNOSIS — N186 End stage renal disease: Secondary | ICD-10-CM | POA: Diagnosis not present

## 2020-02-03 MED FILL — AURYXIA 210 MG TABLET: 1 GM | 30 days supply | Qty: 90 | Fill #2

## 2020-02-06 DIAGNOSIS — N186 End stage renal disease: Secondary | ICD-10-CM | POA: Diagnosis not present

## 2020-02-06 DIAGNOSIS — Z992 Dependence on renal dialysis: Secondary | ICD-10-CM | POA: Diagnosis not present

## 2020-02-06 DIAGNOSIS — N2581 Secondary hyperparathyroidism of renal origin: Secondary | ICD-10-CM | POA: Diagnosis not present

## 2020-02-06 DIAGNOSIS — D509 Iron deficiency anemia, unspecified: Secondary | ICD-10-CM | POA: Diagnosis not present

## 2020-02-06 DIAGNOSIS — E1129 Type 2 diabetes mellitus with other diabetic kidney complication: Secondary | ICD-10-CM | POA: Diagnosis not present

## 2020-02-06 DIAGNOSIS — D631 Anemia in chronic kidney disease: Secondary | ICD-10-CM | POA: Diagnosis not present

## 2020-02-07 ENCOUNTER — Telehealth: Payer: Self-pay

## 2020-02-07 ENCOUNTER — Other Ambulatory Visit: Payer: Self-pay | Admitting: Oncology

## 2020-02-07 ENCOUNTER — Other Ambulatory Visit: Payer: Self-pay

## 2020-02-07 ENCOUNTER — Inpatient Hospital Stay: Payer: Medicare Other

## 2020-02-07 ENCOUNTER — Inpatient Hospital Stay: Payer: Medicare Other | Attending: Oncology

## 2020-02-07 VITALS — BP 106/49 | HR 83 | Temp 98.3°F | Resp 18

## 2020-02-07 DIAGNOSIS — N189 Chronic kidney disease, unspecified: Secondary | ICD-10-CM | POA: Diagnosis not present

## 2020-02-07 DIAGNOSIS — Z5112 Encounter for antineoplastic immunotherapy: Secondary | ICD-10-CM | POA: Insufficient documentation

## 2020-02-07 DIAGNOSIS — D631 Anemia in chronic kidney disease: Secondary | ICD-10-CM | POA: Diagnosis not present

## 2020-02-07 DIAGNOSIS — N186 End stage renal disease: Secondary | ICD-10-CM | POA: Diagnosis not present

## 2020-02-07 DIAGNOSIS — E1122 Type 2 diabetes mellitus with diabetic chronic kidney disease: Secondary | ICD-10-CM | POA: Diagnosis not present

## 2020-02-07 DIAGNOSIS — C9000 Multiple myeloma not having achieved remission: Secondary | ICD-10-CM

## 2020-02-07 DIAGNOSIS — Z992 Dependence on renal dialysis: Secondary | ICD-10-CM | POA: Diagnosis not present

## 2020-02-07 LAB — CMP (CANCER CENTER ONLY)
ALT: 80 U/L — ABNORMAL HIGH (ref 0–44)
AST: 35 U/L (ref 15–41)
Albumin: 3.9 g/dL (ref 3.5–5.0)
Alkaline Phosphatase: 156 U/L — ABNORMAL HIGH (ref 38–126)
Anion gap: 15 (ref 5–15)
BUN: 29 mg/dL — ABNORMAL HIGH (ref 6–20)
CO2: 32 mmol/L (ref 22–32)
Calcium: 9 mg/dL (ref 8.9–10.3)
Chloride: 96 mmol/L — ABNORMAL LOW (ref 98–111)
Creatinine: 7.68 mg/dL (ref 0.61–1.24)
GFR, Est AFR Am: 8 mL/min — ABNORMAL LOW (ref >60)
GFR, Estimated: 7 mL/min — ABNORMAL LOW (ref >60)
Glucose, Bld: 106 mg/dL — ABNORMAL HIGH (ref 70–99)
Potassium: 4.6 mmol/L (ref 3.5–5.1)
Sodium: 143 mmol/L (ref 135–145)
Total Bilirubin: 1.1 mg/dL (ref 0.3–1.2)
Total Protein: 7 g/dL (ref 6.5–8.1)

## 2020-02-07 LAB — CBC WITH DIFFERENTIAL (CANCER CENTER ONLY)
Abs Immature Granulocytes: 0.03 10*3/uL (ref 0.00–0.07)
Basophils Absolute: 0.1 10*3/uL (ref 0.0–0.1)
Basophils Relative: 1 %
Eosinophils Absolute: 0.4 10*3/uL (ref 0.0–0.5)
Eosinophils Relative: 4 %
HCT: 32.7 % — ABNORMAL LOW (ref 39.0–52.0)
Hemoglobin: 12.1 g/dL — ABNORMAL LOW (ref 13.0–17.0)
Immature Granulocytes: 0 %
Lymphocytes Relative: 3 %
Lymphs Abs: 0.3 10*3/uL — ABNORMAL LOW (ref 0.7–4.0)
MCH: 32.9 pg (ref 26.0–34.0)
MCHC: 37 g/dL — ABNORMAL HIGH (ref 30.0–36.0)
MCV: 88.9 fL (ref 80.0–100.0)
Monocytes Absolute: 0.5 10*3/uL (ref 0.1–1.0)
Monocytes Relative: 4 %
Neutro Abs: 9.3 10*3/uL — ABNORMAL HIGH (ref 1.7–7.7)
Neutrophils Relative %: 88 %
Platelet Count: 261 10*3/uL (ref 150–400)
RBC: 3.68 MIL/uL — ABNORMAL LOW (ref 4.22–5.81)
RDW: 16.8 % — ABNORMAL HIGH (ref 11.5–15.5)
WBC Count: 10.6 10*3/uL — ABNORMAL HIGH (ref 4.0–10.5)
nRBC: 23.4 % — ABNORMAL HIGH (ref 0.0–0.2)

## 2020-02-07 MED ORDER — BORTEZOMIB CHEMO SQ INJECTION 3.5 MG (2.5MG/ML)
1.5000 mg/m2 | Freq: Once | INTRAMUSCULAR | Status: AC
  Administered 2020-02-07: 3 mg via SUBCUTANEOUS
  Filled 2020-02-07: qty 1.2

## 2020-02-07 MED ORDER — PROCHLORPERAZINE MALEATE 10 MG PO TABS
ORAL_TABLET | ORAL | Status: AC
  Filled 2020-02-07: qty 1

## 2020-02-07 MED ORDER — PROCHLORPERAZINE MALEATE 10 MG PO TABS
10.0000 mg | ORAL_TABLET | Freq: Once | ORAL | Status: AC
  Administered 2020-02-07: 10 mg via ORAL

## 2020-02-07 MED FILL — ACYCLOVIR 400 MG TABLET: 400 | 90 days supply | Qty: 90 | Fill #0

## 2020-02-07 NOTE — Telephone Encounter (Signed)
error 

## 2020-02-07 NOTE — Patient Instructions (Signed)
[facility] Discharge Instructions for Patients Receiving Chemotherapy  Today you received the following chemotherapy agents Velcade  To help prevent nausea and vomiting after your treatment, we encourage you to take your nausea medication as directed.   If you develop nausea and vomiting that is not controlled by your nausea medication, call the clinic.   BELOW ARE SYMPTOMS THAT SHOULD BE REPORTED IMMEDIATELY:  *FEVER GREATER THAN 100.5 F  *CHILLS WITH OR WITHOUT FEVER  NAUSEA AND VOMITING THAT IS NOT CONTROLLED WITH YOUR NAUSEA MEDICATION  *UNUSUAL SHORTNESS OF BREATH  *UNUSUAL BRUISING OR BLEEDING  TENDERNESS IN MOUTH AND THROAT WITH OR WITHOUT PRESENCE OF ULCERS  *URINARY PROBLEMS  *BOWEL PROBLEMS  UNUSUAL RASH Items with * indicate a potential emergency and should be followed up as soon as possible.  Feel free to call the clinic should you have any questions or concerns. The clinic phone number is [phone].  Please show the [facility] at check-in to the Emergency Department and triage nurse.

## 2020-02-07 NOTE — Telephone Encounter (Addendum)
CRITICAL VALUE STICKER  CRITICAL VALUE: Creatinine 7.68  RECEIVER (on-site recipient of call): Lenox Ponds LPN  DATE & TIME NOTIFIED: 1030  MESSENGER (representative from lab): Karenann [facility]  MD NOTIFIED: Dr. Alen Blew  TIME OF NOTIFICATION: 8412  RESPONSE: No further instruction

## 2020-02-08 ENCOUNTER — Ambulatory Visit (INDEPENDENT_AMBULATORY_CARE_PROVIDER_SITE_OTHER): Payer: Medicare Other

## 2020-02-08 ENCOUNTER — Telehealth: Payer: Medicare Other

## 2020-02-08 DIAGNOSIS — C9000 Multiple myeloma not having achieved remission: Secondary | ICD-10-CM

## 2020-02-08 DIAGNOSIS — N186 End stage renal disease: Secondary | ICD-10-CM

## 2020-02-08 DIAGNOSIS — G40909 Epilepsy, unspecified, not intractable, without status epilepticus: Secondary | ICD-10-CM

## 2020-02-08 DIAGNOSIS — D509 Iron deficiency anemia, unspecified: Secondary | ICD-10-CM | POA: Diagnosis not present

## 2020-02-08 DIAGNOSIS — E1129 Type 2 diabetes mellitus with other diabetic kidney complication: Secondary | ICD-10-CM | POA: Diagnosis not present

## 2020-02-08 DIAGNOSIS — N2581 Secondary hyperparathyroidism of renal origin: Secondary | ICD-10-CM | POA: Diagnosis not present

## 2020-02-08 DIAGNOSIS — Z992 Dependence on renal dialysis: Secondary | ICD-10-CM | POA: Diagnosis not present

## 2020-02-08 DIAGNOSIS — E1122 Type 2 diabetes mellitus with diabetic chronic kidney disease: Secondary | ICD-10-CM

## 2020-02-08 MED FILL — BASAGLAR 100 UNIT/ML KWIKPE: 100 | 28 days supply | Qty: 3 | Fill #2

## 2020-02-08 NOTE — Progress Notes (Signed)
Pharmacist Chemotherapy Monitoring - Follow Up Assessment    I verify that I have reviewed each item in the below checklist:  . Regimen for the patient is scheduled for the appropriate day and plan matches scheduled date. [name] Appropriate non-routine labs are ordered dependent on drug ordered. . If applicable, additional medications reviewed and ordered per protocol based on lifetime cumulative doses and/or treatment regimen.   Plan for follow-up and/or issues identified: No . I-vent associated with next due treatment: No . MD and/or nursing notified: No  Arsalan Brisbin D 02/08/2020 9:14 AM

## 2020-02-09 ENCOUNTER — Ambulatory Visit: Payer: Medicare Other

## 2020-02-09 DIAGNOSIS — C9000 Multiple myeloma not having achieved remission: Secondary | ICD-10-CM

## 2020-02-09 DIAGNOSIS — E1122 Type 2 diabetes mellitus with diabetic chronic kidney disease: Secondary | ICD-10-CM

## 2020-02-09 DIAGNOSIS — N186 End stage renal disease: Secondary | ICD-10-CM

## 2020-02-09 MED FILL — clonazePAM 0.5 MG TABS: 0.5 | 35 days supply | Qty: 30 | Fill #1

## 2020-02-09 NOTE — Chronic Care Management (AMB) (Signed)
Chronic Care Management    Social Work Follow Up Note  02/09/2020 Name: Isaiah [name] [facility] MRN: [id number] DOB: [birth year]  Isaiah [name] is a 54 y.o. year old male who is a primary care patient of [name], MD. The CCM team was consulted for assistance with care coordination.   Review of patient status, including review of consultants reports, other relevant assessments, and collaboration with appropriate care team members and the patient's provider was performed as part of comprehensive patient evaluation and provision of chronic care management services.    SDOH (Social Determinants of Health) assessments performed: No    Outpatient Encounter Medications as of 02/09/2020  Medication Sig Note  . acetaminophen (TYLENOL) 325 MG tablet Take 1-2 tablets (325-650 mg total) by mouth every 4 (four) hours as needed for mild pain.   [name] acyclovir (ZOVIRAX) 400 MG tablet TAKE 1 TABLET BY MOUTH ONCE DAILY   . albuterol (PROAIR HFA) 108 (90 Base) MCG/ACT inhaler Inhale two puffs every 4-6 hours if needed for cough or wheeze   . Alcohol Swabs (SM ALCOHOL PREP) 70 % PADS    . AURYXIA 1 GM 210 MG(Fe) tablet Take 210 mg by mouth 3 (three) times daily.   [name] BREO ELLIPTA 200-25 MCG/INH AEPB INHALE 1 PUFF BY MOUTH AT THE SAME TIME EVERY DAY   . calcitRIOL (ROCALTROL) 0.25 MCG capsule Take by mouth 3 (three) times a week.   . calcium carbonate (TUMS - DOSED IN MG ELEMENTAL CALCIUM) 500 MG chewable tablet Chew 2 tablets by mouth at bedtime. 10/04/2019: On hold per MD  . camphor-menthol (SARNA) lotion Apply 1 application topically every 8 (eight) hours as needed for itching.   . Cetirizine HCl 10 MG CAPS Take 1 capsule by mouth daily.   . cinacalcet (SENSIPAR) 60 MG tablet Take 60 mg by mouth every evening. ON HOLD FOR DIALYSIS PER WIFE   . clonazePAM (KLONOPIN) 0.5 MG tablet Take 2 tablets every Monday, Wednesday, and Friday with hemodialysis   . DARZALEX FASPRO 1800-30000 MG-UT/15ML SOLN Inject 1   .  dexamethasone (DECADRON) 4 MG tablet Take 5 tablets (20 mg total) by mouth once a week. Take 5 tablets (20 mg total) by mouth once a week on day of chemo.   [name] FREESTYLE LITE test strip    . hydrOXYzine (ATARAX/VISTARIL) 25 MG tablet Take 1 tablet (25 mg total) by mouth every 8 (eight) hours as needed for itching.   . Insulin Glargine (BASAGLAR KWIKPEN) 100 UNIT/ML 8 Units.    . Insulin Lispro (HUMALOG KWIKPEN [place]) Inject 2-4 Units into the skin 3 (three) times daily before meals. Sliding scale: 150-250 =2 units, 250 -350=3 units, 350 += 4 units (On Hold)   . lacosamide (VIMPAT) 200 MG TABS tablet Take 1 tablet twice a day except on dialysis days M-W-F give 1 tab in AM, 1 tab after dialysis, 1 tab in PM.   . lamoTRIgine (LAMICTAL) 200 MG tablet Take 1 tablet (200 mg total) by mouth 2 (two) times daily.   . Lancets (FREESTYLE) lancets    . Methoxy PEG-Epoetin Beta (MIRCERA IJ) Mircera   . midodrine (PROAMATINE) 5 MG tablet Take one tablet by mouth 30 minutes before dialysis.   [name] multivitamin (RENA-VIT) TABS tablet Take 1 tablet by mouth at bedtime.   . Nutritional Supplements (FEEDING SUPPLEMENT, NEPRO CARB STEADY,) LIQD Take 237 mLs by mouth 3 (three) times daily as needed (Supplement).   . pantoprazole (PROTONIX) 40 MG tablet TAKE 1 TABLET BY MOUTH  ONCE DAILY   . prochlorperazine (COMPAZINE) 10 MG tablet TAKE 1 TABLET (10 MG TOTAL) BY MOUTH EVERY 6 (SIX) HOURS AS NEEDED FOR NAUSEA OR VOMITING.   [name] UNIFINE PENTIPS 31G X 8 MM MISC USE AS DIRECTED DAILY AFTER SUPPER    No facility-administered encounter medications on file as of 02/09/2020.     Goals Addressed            This Visit's Progress   . Collaborate with RN Care Manager to perform apprpriate assessments to determine care management and care coordination needs   On track    Current Barriers:  . Care management and care coordination needs related to DMII, ESRD, and multiple myeloma  which resulted in inpatient hospitalization . ADL IADL  limitations  Clinical Social Work Clinical Goal(s):  [name] Over the next 60 days, patient will collaborate with care management team to assist with care coordination needs in response to recent inpatient hospitalization . New 01/16/20- Over the next 20 days the patient and his spouse will work with SW to identify cause of Medicaid denial and initiate an appeal Goal Met . New 01/25/20 Over the next 60 days the patient and his wife will work with SW to address ongoing care coordination needs related to caregiver assistance  CCM SW Interventions: Completed 02/09/20 with [name] . Successful outbound call placed to the patients spouse to review goal progression . Determined Mrs. [facility] has been in contact with Ilda Mori who informed Mrs. Brackin due to patient income he would be over the level to receive Medicaid unless he met a monthly deductible of $1,334 o Discussed Mrs. Trani concern for high monthly deductible which she feels in unattainable o Mrs. [facility] has emailed Mrs. Larence Penning copies of patient current medical bills in order to meet deductible for the month of June o Mrs. [place] was informed by Mrs. Larence Penning that due to current guideline under the [place] pandemic, once the patient is approved for Medicaid he can not be taken off the program regardless if deductible is met until Pandemic guidelines change . Assisted in providing feedback on how Medicaid deductible works o Advised Mrs. [place] would reach out to [employer] with [place] office to determine if this also meant patient must meet deductible to receive CAP/DA benefits as well . Successful outbound call placed to Mrs. Jari Sportsman SW provided with supervisory Ilda Mori contact information [phone]) . Successful outbound call placed to Mrs. Larence Penning o Determined the monthly deductible would have to be met in order for the patient to continue CAP/DA benefits o Mrs. Larence Penning reports once deductible is proven to be met by provided medical  bills, the patient would be contacted to schedule an assessment o Confirmed that once the patient is approved for [place] services, including CAP/DA he will continue to receive benefit regardless if monthly deductible is met as long as the state does not change current Medicaid pandemic policies . Unsuccessful outbound call placed to Mrs. [facility] o Voice message left reviewing above information  Patient Self Care Activities:  . Attends all scheduled provider appointments . Calls pharmacy for medication refills . Calls provider office for new concerns or questions . Supportive wife and caregiver to assist with patient care needs   Please see past updates related to this goal by clicking on the "Past Updates" button in the selected goal         Follow Up Plan: SW will follow up with patient by phone over the next 3 weeks.  [name], BSW, CDP Social Worker, Certified Dementia Practitioner [place] [facility] [place] [facility] [phone]  Total time spent performing care coordination and/or care management activities with the patient by phone or face to face = 23 minutes.

## 2020-02-09 NOTE — Patient Instructions (Signed)
Social Worker Visit Information  Goals we discussed today:  Goals Addressed            This Visit's Progress   . Collaborate with RN Care Manager to perform apprpriate assessments to determine care management and care coordination needs   On track    Current Barriers:  . Care management and care coordination needs related to DMII, ESRD, and multiple myeloma  which resulted in inpatient hospitalization . ADL IADL limitations  Clinical Social Work Clinical Goal(s):  [name] Over the next 60 days, patient will collaborate with care management team to assist with care coordination needs in response to recent inpatient hospitalization . New 01/16/20- Over the next 20 days the patient and his spouse will work with SW to identify cause of Medicaid denial and initiate an appeal Goal Met . New 01/25/20 Over the next 60 days the patient and his wife will work with SW to address ongoing care coordination needs related to caregiver assistance  CCM SW Interventions: Completed 02/09/20 with [name] . Successful outbound call placed to the patients spouse to review goal progression . Determined Mrs. [name] has been in contact with Ilda Mori who informed Mrs. [place] due to patient income he would be over the level to receive Medicaid unless he met a monthly deductible of $1,334 o Discussed Mrs. [place]ane concern for high monthly deductible which she feels in unattainable o Mrs. [facility] has emailed Mrs. Larence Penning copies of patient current medical bills in order to meet deductible for the month of June o Mrs. [place] was informed by Mrs. Larence Penning that due to current guideline under the [place] pandemic, once the patient is approved for Medicaid he can not be taken off the program regardless if deductible is met until Pandemic guidelines change . Assisted in providing feedback on how Medicaid deductible works o Advised Mrs. [place] would reach out to [employer] with [place] office to determine if this also meant patient  must meet deductible to receive CAP/DA benefits as well . Successful outbound call placed to Mrs. Jari Sportsman SW provided with supervisory Ilda Mori contact information [phone]) . Successful outbound call placed to Mrs. Larence Penning o Determined the monthly deductible would have to be met in order for the patient to continue CAP/DA benefits o Mrs. Larence Penning reports once deductible is proven to be met by provided medical bills, the patient would be contacted to schedule an assessment o Confirmed that once the patient is approved for [place] services, including CAP/DA he will continue to receive benefit regardless if monthly deductible is met as long as the state does not change current Medicaid pandemic policies . Unsuccessful outbound call placed to Mrs. [facility] o Voice message left reviewing above information  Patient Self Care Activities:  . Attends all scheduled provider appointments . Calls pharmacy for medication refills . Calls provider office for new concerns or questions . Supportive wife and caregiver to assist with patient care needs   Please see past updates related to this goal by clicking on the "Past Updates" button in the selected goal        Follow Up Plan: SW will follow up with patient by phone over the next 3 weeks.   [name], BSW, CDP Social Worker, Certified Dementia Practitioner [place] [facility] [place] [facility] [phone]

## 2020-02-10 DIAGNOSIS — N2581 Secondary hyperparathyroidism of renal origin: Secondary | ICD-10-CM | POA: Diagnosis not present

## 2020-02-10 DIAGNOSIS — D509 Iron deficiency anemia, unspecified: Secondary | ICD-10-CM | POA: Diagnosis not present

## 2020-02-10 DIAGNOSIS — Z992 Dependence on renal dialysis: Secondary | ICD-10-CM | POA: Diagnosis not present

## 2020-02-10 DIAGNOSIS — E1129 Type 2 diabetes mellitus with other diabetic kidney complication: Secondary | ICD-10-CM | POA: Diagnosis not present

## 2020-02-10 DIAGNOSIS — N186 End stage renal disease: Secondary | ICD-10-CM | POA: Diagnosis not present

## 2020-02-10 MED FILL — SUBVENITE 200 MG TABS: 200 | 30 days supply | Qty: 60 | Fill #4

## 2020-02-12 DIAGNOSIS — R269 Unspecified abnormalities of gait and mobility: Secondary | ICD-10-CM | POA: Diagnosis not present

## 2020-02-12 DIAGNOSIS — F445 Conversion disorder with seizures or convulsions: Secondary | ICD-10-CM | POA: Diagnosis not present

## 2020-02-12 DIAGNOSIS — G40909 Epilepsy, unspecified, not intractable, without status epilepticus: Secondary | ICD-10-CM | POA: Diagnosis not present

## 2020-02-12 DIAGNOSIS — R2689 Other abnormalities of gait and mobility: Secondary | ICD-10-CM | POA: Diagnosis not present

## 2020-02-12 DIAGNOSIS — G8110 Spastic hemiplegia affecting unspecified side: Secondary | ICD-10-CM | POA: Diagnosis not present

## 2020-02-13 DIAGNOSIS — E1129 Type 2 diabetes mellitus with other diabetic kidney complication: Secondary | ICD-10-CM | POA: Diagnosis not present

## 2020-02-13 DIAGNOSIS — N2581 Secondary hyperparathyroidism of renal origin: Secondary | ICD-10-CM | POA: Diagnosis not present

## 2020-02-13 DIAGNOSIS — N186 End stage renal disease: Secondary | ICD-10-CM | POA: Diagnosis not present

## 2020-02-13 DIAGNOSIS — Z992 Dependence on renal dialysis: Secondary | ICD-10-CM | POA: Diagnosis not present

## 2020-02-13 DIAGNOSIS — D509 Iron deficiency anemia, unspecified: Secondary | ICD-10-CM | POA: Diagnosis not present

## 2020-02-13 NOTE — Chronic Care Management (AMB) (Signed)
Chronic Care Management   Initial Visit Note  02/09/2020 Name: Isaiah [name] [facility] MRN: [id number] DOB: [birth year]  Referred by: [name], MD Reason for referral : Chronic Care Management (FU RN CM Call )   Isaiah [name] is a 54 y.o. year old male who is a primary care patient of [name], MD. The CCM team was consulted for assistance with chronic disease management and care coordination needs related to DM with ESRD, Multiple Myeoloma  Review of patient status, including review of consultants reports, relevant laboratory and other test results, and collaboration with appropriate care team members and the patient's provider was performed as part of comprehensive patient evaluation and provision of chronic care management services.    SDOH (Social Determinants of Health) assessments performed: Yes - No Acute Challenges identified at this time See Care Plan activities for detailed interventions related to [place] initial outbound CCM RN CM call to [name] to assess for CCM needs, a care plan was established.      Medications: Outpatient Encounter Medications as of 02/08/2020  Medication Sig Note  . acetaminophen (TYLENOL) 325 MG tablet Take 1-2 tablets (325-650 mg total) by mouth every 4 (four) hours as needed for mild pain.   [name] acyclovir (ZOVIRAX) 400 MG tablet TAKE 1 TABLET BY MOUTH ONCE DAILY   . albuterol (PROAIR HFA) 108 (90 Base) MCG/ACT inhaler Inhale two puffs every 4-6 hours if needed for cough or wheeze   . Alcohol Swabs (SM ALCOHOL PREP) 70 % PADS    . AURYXIA 1 GM 210 MG(Fe) tablet Take 210 mg by mouth 3 (three) times daily.   [name] BREO ELLIPTA 200-25 MCG/INH AEPB INHALE 1 PUFF BY MOUTH AT THE SAME TIME EVERY DAY   . calcitRIOL (ROCALTROL) 0.25 MCG capsule Take by mouth 3 (three) times a week.   . calcium carbonate (TUMS - DOSED IN MG ELEMENTAL CALCIUM) 500 MG chewable tablet Chew 2 tablets by mouth at bedtime. 10/04/2019: On hold per MD  . camphor-menthol (SARNA)  lotion Apply 1 application topically every 8 (eight) hours as needed for itching.   . Cetirizine HCl 10 MG CAPS Take 1 capsule by mouth daily.   . cinacalcet (SENSIPAR) 60 MG tablet Take 60 mg by mouth every evening. ON HOLD FOR DIALYSIS PER WIFE   . clonazePAM (KLONOPIN) 0.5 MG tablet Take 2 tablets every Monday, Wednesday, and Friday with hemodialysis   . DARZALEX FASPRO 1800-30000 MG-UT/15ML SOLN Inject 1   . dexamethasone (DECADRON) 4 MG tablet Take 5 tablets (20 mg total) by mouth once a week. Take 5 tablets (20 mg total) by mouth once a week on day of chemo.   [name] FREESTYLE LITE test strip    . hydrOXYzine (ATARAX/VISTARIL) 25 MG tablet Take 1 tablet (25 mg total) by mouth every 8 (eight) hours as needed for itching.   . Insulin Glargine (BASAGLAR KWIKPEN) 100 UNIT/ML 8 Units.    . Insulin Lispro (HUMALOG KWIKPEN [place]) Inject 2-4 Units into the skin 3 (three) times daily before meals. Sliding scale: 150-250 =2 units, 250 -350=3 units, 350 += 4 units (On Hold)   . lacosamide (VIMPAT) 200 MG TABS tablet Take 1 tablet twice a day except on dialysis days M-W-F give 1 tab in AM, 1 tab after dialysis, 1 tab in PM.   . lamoTRIgine (LAMICTAL) 200 MG tablet Take 1 tablet (200 mg total) by mouth 2 (two) times daily.   . Lancets (FREESTYLE) lancets    . Methoxy  PEG-Epoetin Beta (MIRCERA IJ) Mircera   . midodrine (PROAMATINE) 5 MG tablet Take one tablet by mouth 30 minutes before dialysis.   [name] multivitamin (RENA-VIT) TABS tablet Take 1 tablet by mouth at bedtime.   . Nutritional Supplements (FEEDING SUPPLEMENT, NEPRO CARB STEADY,) LIQD Take 237 mLs by mouth 3 (three) times daily as needed (Supplement).   . pantoprazole (PROTONIX) 40 MG tablet TAKE 1 TABLET BY MOUTH ONCE DAILY   . prochlorperazine (COMPAZINE) 10 MG tablet TAKE 1 TABLET (10 MG TOTAL) BY MOUTH EVERY 6 (SIX) HOURS AS NEEDED FOR NAUSEA OR VOMITING.   [name] UNIFINE PENTIPS 31G X 8 MM MISC USE AS DIRECTED DAILY AFTER SUPPER    No  facility-administered encounter medications on file as of 02/08/2020.     Objective:  Lab Results  Component Value Date   HGBA1C 5.1 12/21/2019   HGBA1C 5.1 09/22/2019   HGBA1C 4.8 06/14/2019   Lab Results  Component Value Date   LDLCALC 89 03/17/2019   CREATININE 7.68 (HH) 02/07/2020   BP Readings from Last 3 Encounters:  02/07/20 (!) 106/49  01/31/20 127/71  01/24/20 117/72    Goals Addressed    . "to follow his renal diet and fluid restriction as recommended"       Wife stated:  [place] (see longitudinal plan of care for additional care plan information)  Current Barriers:  [name] Knowledge Deficit related to nutritional and fluid intake per ESRD diet recommendations  . Chronic Disease Management support and education needs related to DM with ESRD, Multiple Myeloma  Nurse Case Manager Clinical Goal(s):  [name] Over the next 90 days, patient will verbalize basic understanding of End Stage Renal disease process and self health management plan as evidenced by patient will maintain ideal body weight without excess fluid greater than 6 lbs between HD treatments . Over the next 90 days, patient will verbalize basic understanding of End Stage Renal disease process and self health management plan as evidenced by patient will maintain adequate nutritional intake per ESRD diet recommendations  CCM RN CM Interventions:  02/09/20 call completed with patient's [name]  . Inter-disciplinary care team collaboration (see longitudinal plan of care) . Evaluation of current treatment plan related to ESRD w/hemodialysis and patient's adherence to plan as established by provider . Determined patient attends the [facility] in [place] on Tuesday, Thursdays and Saturdays; he is followed by Dr. Jimmy Footman . Determined patient has an AVF for use of hemodialysis; Educated on how to check access for patency   . Provided education to patient re: importance of consulting with the  dialysis clinic dietician for nutritional and fluid recommendations based on patient's monthly lab results . Discussed plans with patient for ongoing care management follow up and provided patient with direct contact information for care management team . Provided patient with printed educational materials related to Eating and Nutrition for Hemodialysis   Patient Self Care Activities:  . Self administers medications as prescribed . Attends all scheduled provider appointments . Calls pharmacy for medication refills . Calls provider office for new concerns or questions . Supportive wife to assist with care needs  Initial goal documentation     . "to keep his Cancer well managed"       Wife stated: [place] (see longitudinal plan of care for additional care plan information)  Current Barriers:  . Chronic Disease Management support and education needs related to DM with ESRD, Multiple Myeloma  Nurse Case Manager Clinical Goal(s):  .  Over the next 90 days, patient will work with PCP care team and Oncology to address needs related to disease education and support for ongoing treatment of Multiple Myeloma   CCM RN CM Interventions:  02/09/20 Initial call completed with patient's [name] . Inter-disciplinary care team collaboration (see longitudinal plan of care) . Evaluation of current treatment plan related to Multiple Myeloma and patient's adherence to plan as established by provider . Determined patient is currently under the care of Dr. Alen Blew with [facility]. Medical Oncology with most recent f/u completed on 01/17/20;  o Impression and Plan:  o 1.  Multiple myeloma diagnosed in 2019 arising from MGUS.  He was found to have IgG kappa subtype.  o He continues to tolerate current therapy utilizing Velcade, dexamethasone and daratumumab.  Risks and benefits of continuing this treatment long-term were reviewed.  Potential complications that include infusion related  reactions, peripheral neuropathy among others.  Switching to maintenance daratumumab only would be considered in the future.  Evaluation for high-dose chemotherapy and stem cell transplant currently on hold. o I recommended continuing the same regimen till the end of June and switch to maintenance daratumumab at least for the remainder of the year o 2. Anemia: Related to plasma cell disorder and renal insufficiency.  Hemoglobin is back to normal at this time. o 2.  Antiemetics: No nausea or vomiting reported.  Antiemetics are available to him. o 4.  Renal failure: Continues to be hemodialysis dependent o 5.  Weakness and debilitation: Slow improvement noted at this time without any recent decline. o 6.  Goals of care: Therapy overall remains palliative and aggressive measures are warranted however.  His disease remains incurable but his performance status is adequate continue with the current regimen. o 7.  Hypotension: Resolved at this time with normal blood pressure today. o 8. Follow-up: He will remain on maintenance therapy with weekly Velcade, monthly daratumumab and MD follow-up in 4 weeks o 30 minutes were dedicated to this visit.  The time was spent on updating his disease status, reviewing laboratory data, discussing treatment options and managing complications related to therapy . Discussed plans with patient for ongoing care management follow up and provided patient with direct contact information for care management team  Patient Self Care Activities:  . Self administers medications as prescribed . Attends all scheduled provider appointments . Calls pharmacy for medication refills . Calls provider office for new concerns or questions . Supportive wife to assist with care needs  Initial goal documentation     . COMPLETED: Assist with Chronic Care Management and Care Coordination needs       Current Barriers:  [name] Knowledge Barriers related to resources and support available to  address needs related to Chronic Care Management and Community Resources  Case Manager Clinical Goal(s):  [name] Over the next 60 days, patient will work with the CCM team to address needs related to disease education and support of chronic health conditions  Interventions:  . Collaborated with BSW and initiated plan of care to address needs related to Chronic Care Management and Care Coordination needs  Patient Self Care Activities:  . Attends all scheduled provider appointments . Calls pharmacy for medication refills . Calls provider office for new concerns or questions  Initial goal documentation       Plan:   Telephone follow up appointment with care management team member scheduled for: 04/13/20  Barb Merino, RN, BSN, CCM Care Management Coordinator [facility]  Direct Phone: [phone]

## 2020-02-13 NOTE — Patient Instructions (Signed)
Visit Information  Goals Addressed    . "to follow his renal diet and fluid restriction as recommended"       Wife stated:  CARE PLAN ENTRY (see longitudinal plan of care for additional care plan information)  Current Barriers:  [name] Knowledge Deficit related to nutritional and fluid intake per ESRD diet recommendations  . Chronic Disease Management support and education needs related to DM with ESRD, Multiple Myeloma  Nurse Case Manager Clinical Goal(s):  [name] Over the next 90 days, patient will verbalize basic understanding of End Stage Renal disease process and self health management plan as evidenced by patient will maintain ideal body weight without excess fluid greater than 6 lbs between HD treatments . Over the next 90 days, patient will verbalize basic understanding of End Stage Renal disease process and self health management plan as evidenced by patient will maintain adequate nutritional intake per ESRD diet recommendations  CCM RN CM Interventions:  02/09/20 call completed with patient's [name]  . Inter-disciplinary care team collaboration (see longitudinal plan of care) . Evaluation of current treatment plan related to ESRD w/hemodialysis and patient's adherence to plan as established by provider . Determined patient attends the [facility] in [place] on Tuesday, Thursdays and Saturdays; he is followed by Dr. Jimmy Footman . Determined patient has an AVF for use of hemodialysis; Educated on how to check access for patency   . Provided education to patient re: importance of consulting with the dialysis clinic dietician for nutritional and fluid recommendations based on patient's monthly lab results . Discussed plans with patient for ongoing care management follow up and provided patient with direct contact information for care management team . Provided patient with printed educational materials related to Eating and Nutrition for Hemodialysis   Patient Self Care  Activities:  . Self administers medications as prescribed . Attends all scheduled provider appointments . Calls pharmacy for medication refills . Calls provider office for new concerns or questions . Supportive wife to assist with care needs  Initial goal documentation     . "to keep his Cancer well managed"       Wife stated: [place] (see longitudinal plan of care for additional care plan information)  Current Barriers:  . Chronic Disease Management support and education needs related to DM with ESRD, Multiple Myeloma  Nurse Case Manager Clinical Goal(s):  [name] Over the next 90 days, patient will work with PCP care team and Oncology to address needs related to disease education and support for ongoing treatment of Multiple Myeloma   CCM RN CM Interventions:  02/09/20 Initial call completed with patient's [name] . Inter-disciplinary care team collaboration (see longitudinal plan of care) . Evaluation of current treatment plan related to Multiple Myeloma and patient's adherence to plan as established by provider . Determined patient is currently under the care of Dr. Alen Blew with [facility]. Medical Oncology with most recent f/u completed on 01/17/20;  o Impression and Plan:  o 1.  Multiple myeloma diagnosed in 2019 arising from MGUS.  He was found to have IgG kappa subtype.  o He continues to tolerate current therapy utilizing Velcade, dexamethasone and daratumumab.  Risks and benefits of continuing this treatment long-term were reviewed.  Potential complications that include infusion related reactions, peripheral neuropathy among others.  Switching to maintenance daratumumab only would be considered in the future.  Evaluation for high-dose chemotherapy and stem cell transplant currently on hold. o I recommended continuing the same regimen till  the end of June and switch to maintenance daratumumab at least for the remainder of the year o 2. Anemia: Related to plasma  cell disorder and renal insufficiency.  Hemoglobin is back to normal at this time. o 2.  Antiemetics: No nausea or vomiting reported.  Antiemetics are available to him. o 4.  Renal failure: Continues to be hemodialysis dependent o 5.  Weakness and debilitation: Slow improvement noted at this time without any recent decline. o 6.  Goals of care: Therapy overall remains palliative and aggressive measures are warranted however.  His disease remains incurable but his performance status is adequate continue with the current regimen. o 7.  Hypotension: Resolved at this time with normal blood pressure today. o 8. Follow-up: He will remain on maintenance therapy with weekly Velcade, monthly daratumumab and MD follow-up in 4 weeks o 30 minutes were dedicated to this visit.  The time was spent on updating his disease status, reviewing laboratory data, discussing treatment options and managing complications related to therapy . Discussed plans with patient for ongoing care management follow up and provided patient with direct contact information for care management team  Patient Self Care Activities:  . Self administers medications as prescribed . Attends all scheduled provider appointments . Calls pharmacy for medication refills . Calls provider office for new concerns or questions . Supportive wife to assist with care needs  Initial goal documentation     . COMPLETED: Assist with Chronic Care Management and Care Coordination needs       Current Barriers:  [name] Knowledge Barriers related to resources and support available to address needs related to Chronic Care Management and Community Resources  Case Manager Clinical Goal(s):  [name] Over the next 60 days, patient will work with the CCM team to address needs related to disease education and support of chronic health conditions  Interventions:  . Collaborated with BSW and initiated plan of care to address needs related to Chronic Care Management and Care  Coordination needs  Patient Self Care Activities:  . Attends all scheduled provider appointments . Calls pharmacy for medication refills . Calls provider office for new concerns or questions  Initial goal documentation       Patient verbalizes understanding of instructions provided today.   Telephone follow up appointment with care management team member scheduled for: 04/13/20  Barb Merino, RN, BSN, CCM Care Management Coordinator [facility]  Direct Phone: [phone]

## 2020-02-14 ENCOUNTER — Other Ambulatory Visit: Payer: Self-pay

## 2020-02-14 ENCOUNTER — Inpatient Hospital Stay: Payer: Medicare Other

## 2020-02-14 VITALS — BP 104/54 | HR 84 | Temp 98.1°F | Resp 18 | Wt 186.0 lb

## 2020-02-14 DIAGNOSIS — C9000 Multiple myeloma not having achieved remission: Secondary | ICD-10-CM

## 2020-02-14 DIAGNOSIS — N189 Chronic kidney disease, unspecified: Secondary | ICD-10-CM | POA: Diagnosis not present

## 2020-02-14 DIAGNOSIS — Z992 Dependence on renal dialysis: Secondary | ICD-10-CM | POA: Diagnosis not present

## 2020-02-14 DIAGNOSIS — D631 Anemia in chronic kidney disease: Secondary | ICD-10-CM | POA: Diagnosis not present

## 2020-02-14 DIAGNOSIS — Z5112 Encounter for antineoplastic immunotherapy: Secondary | ICD-10-CM | POA: Diagnosis not present

## 2020-02-14 LAB — CMP (CANCER CENTER ONLY)
ALT: 41 U/L (ref 0–44)
AST: 21 U/L (ref 15–41)
Albumin: 4 g/dL (ref 3.5–5.0)
Alkaline Phosphatase: 153 U/L — ABNORMAL HIGH (ref 38–126)
Anion gap: 17 — ABNORMAL HIGH (ref 5–15)
BUN: 39 mg/dL — ABNORMAL HIGH (ref 6–20)
CO2: 29 mmol/L (ref 22–32)
Calcium: 9.1 mg/dL (ref 8.9–10.3)
Chloride: 97 mmol/L — ABNORMAL LOW (ref 98–111)
Creatinine: 7.8 mg/dL (ref 0.61–1.24)
GFR, Est AFR Am: 8 mL/min — ABNORMAL LOW (ref >60)
GFR, Estimated: 7 mL/min — ABNORMAL LOW (ref >60)
Glucose, Bld: 119 mg/dL — ABNORMAL HIGH (ref 70–99)
Potassium: 4.8 mmol/L (ref 3.5–5.1)
Sodium: 143 mmol/L (ref 135–145)
Total Bilirubin: 0.9 mg/dL (ref 0.3–1.2)
Total Protein: 7.2 g/dL (ref 6.5–8.1)

## 2020-02-14 LAB — CBC WITH DIFFERENTIAL (CANCER CENTER ONLY)
Abs Immature Granulocytes: 0.02 10*3/uL (ref 0.00–0.07)
Basophils Absolute: 0.1 10*3/uL (ref 0.0–0.1)
Basophils Relative: 1 %
Eosinophils Absolute: 0.3 10*3/uL (ref 0.0–0.5)
Eosinophils Relative: 3 %
HCT: 32.2 % — ABNORMAL LOW (ref 39.0–52.0)
Hemoglobin: 11.9 g/dL — ABNORMAL LOW (ref 13.0–17.0)
Immature Granulocytes: 0 %
Lymphocytes Relative: 4 %
Lymphs Abs: 0.4 10*3/uL — ABNORMAL LOW (ref 0.7–4.0)
MCH: 32.7 pg (ref 26.0–34.0)
MCHC: 37 g/dL — ABNORMAL HIGH (ref 30.0–36.0)
MCV: 88.5 fL (ref 80.0–100.0)
Monocytes Absolute: 0.3 10*3/uL (ref 0.1–1.0)
Monocytes Relative: 3 %
Neutro Abs: 8.8 10*3/uL — ABNORMAL HIGH (ref 1.7–7.7)
Neutrophils Relative %: 89 %
Platelet Count: 239 10*3/uL (ref 150–400)
RBC: 3.64 MIL/uL — ABNORMAL LOW (ref 4.22–5.81)
RDW: 16.8 % — ABNORMAL HIGH (ref 11.5–15.5)
WBC Count: 9.9 10*3/uL (ref 4.0–10.5)
nRBC: 16.3 % — ABNORMAL HIGH (ref 0.0–0.2)

## 2020-02-14 MED ORDER — BORTEZOMIB CHEMO SQ INJECTION 3.5 MG (2.5MG/ML)
1.5000 mg/m2 | Freq: Once | INTRAMUSCULAR | Status: AC
  Administered 2020-02-14: 3 mg via SUBCUTANEOUS
  Filled 2020-02-14: qty 1.2

## 2020-02-14 MED ORDER — PROCHLORPERAZINE MALEATE 10 MG PO TABS
10.0000 mg | ORAL_TABLET | Freq: Once | ORAL | Status: AC
  Administered 2020-02-14: 10 mg via ORAL
  Filled 2020-02-14: qty 1

## 2020-02-14 NOTE — Patient Instructions (Signed)
[facility] Discharge Instructions for Patients Receiving Chemotherapy  Today you received the following chemotherapy agent: Bortezomib (Velcade)  To help prevent nausea and vomiting after your treatment, we encourage you to take your nausea medication as directed by your MD.   If you develop nausea and vomiting that is not controlled by your nausea medication, call the clinic.   BELOW ARE SYMPTOMS THAT SHOULD BE REPORTED IMMEDIATELY:  *FEVER GREATER THAN 100.5 F  *CHILLS WITH OR WITHOUT FEVER  NAUSEA AND VOMITING THAT IS NOT CONTROLLED WITH YOUR NAUSEA MEDICATION  *UNUSUAL SHORTNESS OF BREATH  *UNUSUAL BRUISING OR BLEEDING  TENDERNESS IN MOUTH AND THROAT WITH OR WITHOUT PRESENCE OF ULCERS  *URINARY PROBLEMS  *BOWEL PROBLEMS  UNUSUAL RASH Items with * indicate a potential emergency and should be followed up as soon as possible.  Feel free to call the clinic should you have any questions or concerns. The clinic phone number is [phone].  Please show the [place] at check-in to the Emergency Department and triage nurse.  Coronavirus (COVID-19) Are you at risk?  Are you at risk for the Coronavirus (COVID-19)?  To be considered HIGH RISK for Coronavirus (COVID-19), you have to meet the following criteria:  . Traveled to [place], [place], [place], [place] or [place]; or in the [place] to [place], [place], [place], or [place]; and have fever, cough, and shortness of breath within the last 2 weeks of travel OR . Been in close contact with a person diagnosed with COVID-19 within the last 2 weeks and have fever, cough, and shortness of breath . IF YOU DO NOT MEET THESE CRITERIA, YOU ARE CONSIDERED LOW RISK FOR COVID-19.  What to do if you are HIGH RISK for COVID-19?  [name] If you are having a medical emergency, call 911. . Seek medical care right away. Before you go to a doctor's office, urgent care or emergency department, call ahead and tell  them about your recent travel, contact with someone diagnosed with COVID-19, and your symptoms. You should receive instructions from your physician's office regarding next steps of care.  . When you arrive at healthcare provider, tell the healthcare staff immediately you have returned from visiting [place], [place], [place], [place] or [place]; or traveled in the [place] to [place], [place], [place], or [place]; in the last two weeks or you have been in close contact with a person diagnosed with COVID-19 in the last 2 weeks.   . Tell the health care staff about your symptoms: fever, cough and shortness of breath. . After you have been seen by a medical provider, you will be either: o Tested for (COVID-19) and discharged home on quarantine except to seek medical care if symptoms worsen, and asked to  - Stay home and avoid contact with others until you get your results (4-5 days)  - Avoid travel on public transportation if possible (such as bus, train, or airplane) or o Sent to the Emergency Department by EMS for evaluation, COVID-19 testing, and possible admission depending on your condition and test results.  What to do if you are LOW RISK for COVID-19?  Reduce your risk of any infection by using the same precautions used for avoiding the common cold or flu:  [name] Wash your hands often with soap and warm water for at least 20 seconds.  If soap and water are not readily available, use an alcohol-based hand sanitizer with at least 60% alcohol.  [name]  If coughing or sneezing, cover your mouth and nose by coughing or sneezing into the elbow areas of your shirt or coat, into a tissue or into your sleeve (not your hands). . Avoid shaking hands with others and consider head nods or verbal greetings only. . Avoid touching your eyes, nose, or mouth with unwashed hands.  . Avoid close contact with people who are sick. . Avoid places or events with large numbers of people in one location, like concerts or  sporting events. . Carefully consider travel plans you have or are making. . If you are planning any travel outside or inside the [place], visit the CDC's Travelers' Health webpage for the latest health notices. . If you have some symptoms but not all symptoms, continue to monitor at home and seek medical attention if your symptoms worsen. . If you are having a medical emergency, call 911.   [place] / e-Visit: [email or web address]         [facility]: [facility]: [phone]                   [facility]: [phone]

## 2020-02-15 DIAGNOSIS — D509 Iron deficiency anemia, unspecified: Secondary | ICD-10-CM | POA: Diagnosis not present

## 2020-02-15 DIAGNOSIS — N186 End stage renal disease: Secondary | ICD-10-CM | POA: Diagnosis not present

## 2020-02-15 DIAGNOSIS — Z992 Dependence on renal dialysis: Secondary | ICD-10-CM | POA: Diagnosis not present

## 2020-02-15 DIAGNOSIS — E1129 Type 2 diabetes mellitus with other diabetic kidney complication: Secondary | ICD-10-CM | POA: Diagnosis not present

## 2020-02-15 DIAGNOSIS — N2581 Secondary hyperparathyroidism of renal origin: Secondary | ICD-10-CM | POA: Diagnosis not present

## 2020-02-15 LAB — CBC AND DIFFERENTIAL
HCT: 35 — AB (ref 41–53)
Hemoglobin: 11.5 — AB (ref 13.5–17.5)
Neutrophils Absolute: 79
Platelets: 232 (ref 150–399)
WBC: 15.1

## 2020-02-15 LAB — KAPPA/LAMBDA LIGHT CHAINS
Kappa free light chain: 123.2 mg/L — ABNORMAL HIGH (ref 3.3–19.4)
Kappa, lambda light chain ratio: 2.52 — ABNORMAL HIGH (ref 0.26–1.65)
Lambda free light chains: 48.9 mg/L — ABNORMAL HIGH (ref 5.7–26.3)

## 2020-02-15 LAB — BASIC METABOLIC PANEL
BUN: 59 — AB (ref 4–21)
Chloride: 98 — AB (ref 99–108)
Creatinine: 9.4 — AB (ref 0.6–1.3)
Glucose: 134
Potassium: 4.4 (ref 3.4–5.3)
Sodium: 142 (ref 137–147)

## 2020-02-15 LAB — IRON,TIBC AND FERRITIN PANEL
Iron: 132
TIBC: 274

## 2020-02-15 LAB — COMPREHENSIVE METABOLIC PANEL
Albumin: 99 — AB (ref 3.5–5.0)
Calcium: 8.2 — AB (ref 8.7–10.7)

## 2020-02-15 LAB — CBC: RBC: 3.32 — AB (ref 3.87–5.11)

## 2020-02-17 DIAGNOSIS — N2581 Secondary hyperparathyroidism of renal origin: Secondary | ICD-10-CM | POA: Diagnosis not present

## 2020-02-17 DIAGNOSIS — D509 Iron deficiency anemia, unspecified: Secondary | ICD-10-CM | POA: Diagnosis not present

## 2020-02-17 DIAGNOSIS — E1129 Type 2 diabetes mellitus with other diabetic kidney complication: Secondary | ICD-10-CM | POA: Diagnosis not present

## 2020-02-17 DIAGNOSIS — Z992 Dependence on renal dialysis: Secondary | ICD-10-CM | POA: Diagnosis not present

## 2020-02-17 DIAGNOSIS — N186 End stage renal disease: Secondary | ICD-10-CM | POA: Diagnosis not present

## 2020-02-17 LAB — MULTIPLE MYELOMA PANEL, SERUM
Albumin SerPl Elph-Mcnc: 3.9 g/dL (ref 2.9–4.4)
Albumin/Glob SerPl: 1.5 (ref 0.7–1.7)
Alpha 1: 0.3 g/dL (ref 0.0–0.4)
Alpha2 Glob SerPl Elph-Mcnc: 0.8 g/dL (ref 0.4–1.0)
B-Globulin SerPl Elph-Mcnc: 0.9 g/dL (ref 0.7–1.3)
Gamma Glob SerPl Elph-Mcnc: 0.7 g/dL (ref 0.4–1.8)
Globulin, Total: 2.7 g/dL (ref 2.2–3.9)
IgA: 26 mg/dL — ABNORMAL LOW (ref 90–386)
IgG (Immunoglobin G), Serum: 648 mg/dL (ref 603–1613)
IgM (Immunoglobulin M), Srm: 18 mg/dL — ABNORMAL LOW (ref 20–172)
Total Protein ELP: 6.6 g/dL (ref 6.0–8.5)

## 2020-02-20 DIAGNOSIS — N2581 Secondary hyperparathyroidism of renal origin: Secondary | ICD-10-CM | POA: Diagnosis not present

## 2020-02-20 DIAGNOSIS — N186 End stage renal disease: Secondary | ICD-10-CM | POA: Diagnosis not present

## 2020-02-20 DIAGNOSIS — Z992 Dependence on renal dialysis: Secondary | ICD-10-CM | POA: Diagnosis not present

## 2020-02-20 DIAGNOSIS — E1129 Type 2 diabetes mellitus with other diabetic kidney complication: Secondary | ICD-10-CM | POA: Diagnosis not present

## 2020-02-20 DIAGNOSIS — D509 Iron deficiency anemia, unspecified: Secondary | ICD-10-CM | POA: Diagnosis not present

## 2020-02-21 ENCOUNTER — Other Ambulatory Visit: Payer: Self-pay

## 2020-02-21 ENCOUNTER — Inpatient Hospital Stay: Payer: Medicare Other

## 2020-02-21 ENCOUNTER — Telehealth: Payer: Self-pay

## 2020-02-21 ENCOUNTER — Inpatient Hospital Stay (HOSPITAL_BASED_OUTPATIENT_CLINIC_OR_DEPARTMENT_OTHER): Payer: Medicare Other | Admitting: Oncology

## 2020-02-21 ENCOUNTER — Encounter: Payer: Self-pay | Admitting: Internal Medicine

## 2020-02-21 VITALS — BP 126/55 | HR 87 | Temp 97.2°F | Resp 16 | Ht 70.0 in | Wt 184.6 lb

## 2020-02-21 DIAGNOSIS — N189 Chronic kidney disease, unspecified: Secondary | ICD-10-CM | POA: Diagnosis not present

## 2020-02-21 DIAGNOSIS — C9000 Multiple myeloma not having achieved remission: Secondary | ICD-10-CM

## 2020-02-21 DIAGNOSIS — Z992 Dependence on renal dialysis: Secondary | ICD-10-CM | POA: Diagnosis not present

## 2020-02-21 DIAGNOSIS — D631 Anemia in chronic kidney disease: Secondary | ICD-10-CM | POA: Diagnosis not present

## 2020-02-21 DIAGNOSIS — Z5112 Encounter for antineoplastic immunotherapy: Secondary | ICD-10-CM | POA: Diagnosis not present

## 2020-02-21 LAB — CBC WITH DIFFERENTIAL (CANCER CENTER ONLY)
Abs Immature Granulocytes: 0.04 10*3/uL (ref 0.00–0.07)
Basophils Absolute: 0.1 10*3/uL (ref 0.0–0.1)
Basophils Relative: 1 %
Eosinophils Absolute: 0.4 10*3/uL (ref 0.0–0.5)
Eosinophils Relative: 3 %
HCT: 32.9 % — ABNORMAL LOW (ref 39.0–52.0)
Hemoglobin: 11.9 g/dL — ABNORMAL LOW (ref 13.0–17.0)
Immature Granulocytes: 0 %
Lymphocytes Relative: 1 %
Lymphs Abs: 0.2 10*3/uL — ABNORMAL LOW (ref 0.7–4.0)
MCH: 32.4 pg (ref 26.0–34.0)
MCHC: 36.2 g/dL — ABNORMAL HIGH (ref 30.0–36.0)
MCV: 89.6 fL (ref 80.0–100.0)
Monocytes Absolute: 0.7 10*3/uL (ref 0.1–1.0)
Monocytes Relative: 5 %
Neutro Abs: 13.7 10*3/uL — ABNORMAL HIGH (ref 1.7–7.7)
Neutrophils Relative %: 90 %
Platelet Count: 242 10*3/uL (ref 150–400)
RBC: 3.67 MIL/uL — ABNORMAL LOW (ref 4.22–5.81)
RDW: 16.9 % — ABNORMAL HIGH (ref 11.5–15.5)
WBC Count: 15.1 10*3/uL — ABNORMAL HIGH (ref 4.0–10.5)
nRBC: 15.2 % — ABNORMAL HIGH (ref 0.0–0.2)

## 2020-02-21 LAB — CMP (CANCER CENTER ONLY)
ALT: 34 U/L (ref 0–44)
AST: 17 U/L (ref 15–41)
Albumin: 4 g/dL (ref 3.5–5.0)
Alkaline Phosphatase: 148 U/L — ABNORMAL HIGH (ref 38–126)
Anion gap: 16 — ABNORMAL HIGH (ref 5–15)
BUN: 40 mg/dL — ABNORMAL HIGH (ref 6–20)
CO2: 32 mmol/L (ref 22–32)
Calcium: 8.6 mg/dL — ABNORMAL LOW (ref 8.9–10.3)
Chloride: 94 mmol/L — ABNORMAL LOW (ref 98–111)
Creatinine: 8.4 mg/dL (ref 0.61–1.24)
GFR, Est AFR Am: 7 mL/min — ABNORMAL LOW (ref >60)
GFR, Estimated: 6 mL/min — ABNORMAL LOW (ref >60)
Glucose, Bld: 183 mg/dL — ABNORMAL HIGH (ref 70–99)
Potassium: 4.6 mmol/L (ref 3.5–5.1)
Sodium: 142 mmol/L (ref 135–145)
Total Bilirubin: 1.1 mg/dL (ref 0.3–1.2)
Total Protein: 6.9 g/dL (ref 6.5–8.1)

## 2020-02-21 MED ORDER — PROCHLORPERAZINE MALEATE 10 MG PO TABS
10.0000 mg | ORAL_TABLET | Freq: Once | ORAL | Status: AC
  Administered 2020-02-21: 10 mg via ORAL

## 2020-02-21 MED ORDER — PROCHLORPERAZINE MALEATE 10 MG PO TABS
ORAL_TABLET | ORAL | Status: AC
  Filled 2020-02-21: qty 1

## 2020-02-21 MED ORDER — BORTEZOMIB CHEMO SQ INJECTION 3.5 MG (2.5MG/ML)
1.5000 mg/m2 | Freq: Once | INTRAMUSCULAR | Status: AC
  Administered 2020-02-21: 3 mg via SUBCUTANEOUS
  Filled 2020-02-21: qty 1.2

## 2020-02-21 NOTE — Progress Notes (Signed)
Per Dr. Alen Blew, ok to treat with Scr 8.4.

## 2020-02-21 NOTE — Progress Notes (Signed)
Hematology and Oncology Follow Up Visit  [place] [id number] 1966/05/15 54 y.o. 02/21/2020 9:26 AM    Principle Diagnosis: 54 year old man with IgG kappa multiple myeloma diagnosed in 2019.  He was found to have MGUS in 2016.  Past therapy: Velcade dexamethasone and cyclophosphamide weekly started on 05/25/2018.  Velcade is given at 1.5 mg/m weekly, dexamethasone is 20 mg weekly and and Cytoxan is given at 650 mg total dose.  Last treatment was given in August 2020.    Therapy was held today in anticipation of high-dose chemotherapy and autologous stem cell transplant.  He did not receive stem cell transplant due to subdural hematoma and sepsis in September 2020.  Current therapy:  Velcade at 1.5 mg per metered squared subcutaneously weekly, dexamethasone 20 mg weekly, daratumumab 1800 mg subcutaneous injection.   Cycle 1 started on 08/30/2019.  He is currently on daratumumab monthly with weekly Velcade.  Interim History:  Mr. Perkin is here for return evaluation.  Since the last visit, he reports no major changes in his health.  He continues to tolerate therapy without any complaints at this time.  Denies any nausea, vomiting or abdominal pain.  He denies any recent hospitalizations or illnesses.  He has improved mobility although uses a walker at home.  He denies any recent falls or syncope.                      Medications: Reviewed without changes.  Current Outpatient Medications:  .  acetaminophen (TYLENOL) 325 MG tablet, Take 1-2 tablets (325-650 mg total) by mouth every 4 (four) hours as needed for mild pain., Disp:  , Rfl:  .  acyclovir (ZOVIRAX) 400 MG tablet, TAKE 1 TABLET BY MOUTH ONCE DAILY, Disp: 90 tablet, Rfl: 0 .  albuterol (PROAIR HFA) 108 (90 Base) MCG/ACT inhaler, Inhale two puffs every 4-6 hours if needed for cough or wheeze, Disp: 6.7 g, Rfl: 2 .  Alcohol Swabs (SM ALCOHOL PREP) 70 % PADS, , Disp: , Rfl:  .  AURYXIA 1 GM 210 MG(Fe) tablet, Take 210  mg by mouth 3 (three) times daily., Disp: , Rfl:  .  BREO ELLIPTA 200-25 MCG/INH AEPB, INHALE 1 PUFF BY MOUTH AT THE SAME TIME EVERY DAY, Disp: 60 each, Rfl: 2 .  calcitRIOL (ROCALTROL) 0.25 MCG capsule, Take by mouth 3 (three) times a week., Disp: , Rfl:  .  calcium carbonate (TUMS - DOSED IN MG ELEMENTAL CALCIUM) 500 MG chewable tablet, Chew 2 tablets by mouth at bedtime., Disp: , Rfl:  .  camphor-menthol (SARNA) lotion, Apply 1 application topically every 8 (eight) hours as needed for itching., Disp: 222 mL, Rfl: 0 .  Cetirizine HCl 10 MG CAPS, Take 1 capsule by mouth daily., Disp: , Rfl:  .  cinacalcet (SENSIPAR) 60 MG tablet, Take 60 mg by mouth every evening. ON HOLD FOR DIALYSIS PER WIFE, Disp: , Rfl:  .  clonazePAM (KLONOPIN) 0.5 MG tablet, Take 2 tablets every Monday, Wednesday, and Friday with hemodialysis, Disp: 30 tablet, Rfl: 5 .  DARZALEX FASPRO 1800-30000 MG-UT/15ML SOLN, Inject 1, Disp: , Rfl:  .  dexamethasone (DECADRON) 4 MG tablet, Take 5 tablets (20 mg total) by mouth once a week. Take 5 tablets (20 mg total) by mouth once a week on day of chemo., Disp: 40 tablet, Rfl: 3 .  FREESTYLE LITE test strip, , Disp: , Rfl:  .  hydrOXYzine (ATARAX/VISTARIL) 25 MG tablet, Take 1 tablet (25 mg total) by mouth every  8 (eight) hours as needed for itching., Disp: 30 tablet, Rfl: 0 .  Insulin Glargine (BASAGLAR KWIKPEN) 100 UNIT/ML, 8 Units. , Disp: , Rfl:  .  Insulin Lispro (HUMALOG KWIKPEN [place]), Inject 2-4 Units into the skin 3 (three) times daily before meals. Sliding scale: 150-250 =2 units, 250 -350=3 units, 350 += 4 units (On Hold), Disp: , Rfl:  .  lacosamide (VIMPAT) 200 MG TABS tablet, Take 1 tablet twice a day except on dialysis days M-W-F give 1 tab in AM, 1 tab after dialysis, 1 tab in PM., Disp: 90 tablet, Rfl: 5 .  lamoTRIgine (LAMICTAL) 200 MG tablet, Take 1 tablet (200 mg total) by mouth 2 (two) times daily., Disp: 60 tablet, Rfl: 11 .  Lancets (FREESTYLE) lancets, , Disp: , Rfl:   .  Methoxy PEG-Epoetin Beta (MIRCERA IJ), Mircera, Disp: , Rfl:  .  midodrine (PROAMATINE) 5 MG tablet, Take one tablet by mouth 30 minutes before dialysis., Disp: 30 tablet, Rfl: 1 .  multivitamin (RENA-VIT) TABS tablet, Take 1 tablet by mouth at bedtime., Disp: , Rfl:  .  Nutritional Supplements (FEEDING SUPPLEMENT, NEPRO CARB STEADY,) LIQD, Take 237 mLs by mouth 3 (three) times daily as needed (Supplement)., Disp: 237 mL, Rfl: 12 .  pantoprazole (PROTONIX) 40 MG tablet, TAKE 1 TABLET BY MOUTH ONCE DAILY, Disp: 90 tablet, Rfl: 1 .  prochlorperazine (COMPAZINE) 10 MG tablet, TAKE 1 TABLET (10 MG TOTAL) BY MOUTH EVERY 6 (SIX) HOURS AS NEEDED FOR NAUSEA OR VOMITING., Disp: 30 tablet, Rfl: 0 .  UNIFINE PENTIPS 31G X 8 MM MISC, USE AS DIRECTED DAILY AFTER SUPPER, Disp: 100 each, Rfl: 3  Allergies:  Allergies  Allergen Reactions  . Codeine Swelling  . Codeine Rash and Other (See Comments)    Unknown reaction (patient says it was more serious than just a rash, but he can't remember what happened) Unknown reaction (patient says it was more serious than just a rash, but he can't remember what happened)      Physical Exam:     Blood pressure (!) 126/55, pulse 87, temperature (!) 97.2 F (36.2 C), temperature source Temporal, resp. rate 16, height 5' 10"  (1.778 m), weight 184 lb 9.6 oz (83.7 kg), SpO2 100 %.       ECOG: 1     General appearance: Comfortable appearing without any discomfort Head: Normocephalic without any trauma Oropharynx: Mucous membranes are moist and pink without any thrush or ulcers. Eyes: Pupils are equal and round reactive to light. Lymph nodes: No cervical, supraclavicular, inguinal or axillary lymphadenopathy.   Heart:regular rate and rhythm.  S1 and S2 without leg edema. Lung: Clear without any rhonchi or wheezes.  No dullness to percussion. Abdomin: Soft, nontender, nondistended with good bowel sounds.  No hepatosplenomegaly. Musculoskeletal: No joint  deformity or effusion.  Full range of motion noted. Neurological: No deficits noted on motor, sensory and deep tendon reflex exam. Skin: No petechial rash or dryness.  Appeared moist.                  Lab Results: Lab Results  Component Value Date   WBC 9.9 02/14/2020   HGB 11.9 (L) 02/14/2020   HCT 32.2 (L) 02/14/2020   MCV 88.5 02/14/2020   PLT 239 02/14/2020     Chemistry      Component Value Date/Time   NA 143 02/14/2020 1008   NA 143 01/19/2020 0000   NA 138 07/03/2017 0813   K 4.8 02/14/2020 1008   K  4.2 07/03/2017 0813   CL 97 (L) 02/14/2020 1008   CO2 29 02/14/2020 1008   CO2 27 07/03/2017 0813   BUN 39 (H) 02/14/2020 1008   BUN 61 (A) 01/19/2020 0000   BUN 46.4 (H) 07/03/2017 0813   CREATININE 7.80 (HH) 02/14/2020 1008   CREATININE 10.9 (HH) 07/03/2017 0813   GLU 197 12/21/2019 0000      Component Value Date/Time   CALCIUM 9.1 02/14/2020 1008   CALCIUM 9.3 07/03/2017 0813   ALKPHOS 153 (H) 02/14/2020 1008   ALKPHOS 77 07/03/2017 0813   AST 21 02/14/2020 1008   AST 16 07/03/2017 0813   ALT 41 02/14/2020 1008   ALT 14 07/03/2017 0813   BILITOT 0.9 02/14/2020 1008   BILITOT 0.93 07/03/2017 0813       Results for ABDULHADI, STOPA (MRN [id number]) as of 02/21/2020 09:28  Ref. Range 01/10/2020 10:07 02/14/2020 10:08  M Protein SerPl Elph-Mcnc Latest Ref Range: Not Observed g/dL 0.2 (H) Not Observed  IFE 1 Unknown Comment (A) Comment (A)  Globulin, Total Latest Ref Range: 2.2 - 3.9 g/dL 2.5 2.7  B-Globulin SerPl Elph-Mcnc Latest Ref Range: 0.7 - 1.3 g/dL 0.8 0.9  IgG (Immunoglobin G), Serum Latest Ref Range: 603 - 1,613 mg/dL 726 648  IgM (Immunoglobulin M), Srm Latest Ref Range: 20 - 172 mg/dL 18 (L) 18 (L)  IgA Latest Ref Range: 90 - 386 mg/dL 23 (L) 26 (L)   Results for YASH, CACCIOLA (MRN [id number]) as of 02/21/2020 09:28  Ref. Range 01/10/2020 10:07 02/14/2020 10:08  Kappa free light chain Latest Ref Range: 3.3 - 19.4 mg/L 132.1 (H) 123.2 (H)   Lamda free light chains Latest Ref Range: 5.7 - 26.3 mg/L 48.5 (H) 48.9 (H)  Kappa, lamda light chain ratio Latest Ref Range: 0.26 - 1.65  2.72 (H) 2.52 (H)    Impression and Plan:  54 year old man with:  1.  IgG kappa multiple myeloma arising from MGUS diagnosed in 2019.    He is currently on Velcade and daratumumab with dexamethasone for induction purposes.  Protein studies on 02/14/2020 were reviewed and showed a near complete response at this time with M spike is no longer detectable and IgG level is within normal range.  His kappa to lambda free light chain slightly elevated.  Risks and benefits of this approach were reviewed at this time.  I recommended continuing induction therapy to complete months of June and switch to a monthly daratumumab maintenance.  He is agreeable with this plan.   2. Anemia: Hemoglobin remained stable at this time and does not require any intervention.  His anemia is related to malignancy and renal insufficiency.   3.  Antiemetics: No nausea or vomiting reported at this time.  4.  Renal failure: He is currently on dialysis without any recent issues.   5. Goals of care: Aggressive measures are warranted at this time given his overall reasonable performance status.  His disease could be incurable however.   6. Follow-up: He will receive Velcade and daratumumab on June 22, Velcade June 29 and will resume monthly daratumumab monthly only starting on July 20.   30 minutes were spent on this encounter.  The time was dedicated to reviewing his disease status, discussing treatment options and addressing future plan of care.    Zola Button, MD 6/15/20219:26 AM

## 2020-02-21 NOTE — Patient Instructions (Signed)
[facility] Discharge Instructions for Patients Receiving Chemotherapy  Today you received the following chemotherapy agent: Bortezomib (Velcade)  To help prevent nausea and vomiting after your treatment, we encourage you to take your nausea medication as directed by your MD.   If you develop nausea and vomiting that is not controlled by your nausea medication, call the clinic.   BELOW ARE SYMPTOMS THAT SHOULD BE REPORTED IMMEDIATELY:  *FEVER GREATER THAN 100.5 F  *CHILLS WITH OR WITHOUT FEVER  NAUSEA AND VOMITING THAT IS NOT CONTROLLED WITH YOUR NAUSEA MEDICATION  *UNUSUAL SHORTNESS OF BREATH  *UNUSUAL BRUISING OR BLEEDING  TENDERNESS IN MOUTH AND THROAT WITH OR WITHOUT PRESENCE OF ULCERS  *URINARY PROBLEMS  *BOWEL PROBLEMS  UNUSUAL RASH Items with * indicate a potential emergency and should be followed up as soon as possible.  Feel free to call the clinic should you have any questions or concerns. The clinic phone number is [phone].  Please show the [place] at check-in to the Emergency Department and triage nurse.

## 2020-02-21 NOTE — Telephone Encounter (Signed)
Received a call from P. Sutcavage in the lab at 1011 to report creatinine of 8.40. Per Dr. Hazeline Junker note in patient's treatment plan, patient has ESRD on HD. Do not need to notify MD of elevated serum creatinine.

## 2020-02-22 ENCOUNTER — Telehealth: Payer: Self-pay | Admitting: Oncology

## 2020-02-22 DIAGNOSIS — D509 Iron deficiency anemia, unspecified: Secondary | ICD-10-CM | POA: Diagnosis not present

## 2020-02-22 DIAGNOSIS — N2581 Secondary hyperparathyroidism of renal origin: Secondary | ICD-10-CM | POA: Diagnosis not present

## 2020-02-22 DIAGNOSIS — E1129 Type 2 diabetes mellitus with other diabetic kidney complication: Secondary | ICD-10-CM | POA: Diagnosis not present

## 2020-02-22 DIAGNOSIS — Z992 Dependence on renal dialysis: Secondary | ICD-10-CM | POA: Diagnosis not present

## 2020-02-22 DIAGNOSIS — N186 End stage renal disease: Secondary | ICD-10-CM | POA: Diagnosis not present

## 2020-02-22 NOTE — Telephone Encounter (Signed)
Scheduled appt per 6/15 los.  Pt will get an updated appt calendar at their next scheduled appt.

## 2020-02-23 ENCOUNTER — Other Ambulatory Visit (HOSPITAL_COMMUNITY): Payer: Self-pay | Admitting: Endocrinology

## 2020-02-23 MED FILL — UNIFINE PENTIPS 32GX5/32: 32G X 4 MM | 80 days supply | Qty: 400 | Fill #0

## 2020-02-24 DIAGNOSIS — N2581 Secondary hyperparathyroidism of renal origin: Secondary | ICD-10-CM | POA: Diagnosis not present

## 2020-02-24 DIAGNOSIS — Z992 Dependence on renal dialysis: Secondary | ICD-10-CM | POA: Diagnosis not present

## 2020-02-24 DIAGNOSIS — E1129 Type 2 diabetes mellitus with other diabetic kidney complication: Secondary | ICD-10-CM | POA: Diagnosis not present

## 2020-02-24 DIAGNOSIS — D509 Iron deficiency anemia, unspecified: Secondary | ICD-10-CM | POA: Diagnosis not present

## 2020-02-24 DIAGNOSIS — N186 End stage renal disease: Secondary | ICD-10-CM | POA: Diagnosis not present

## 2020-02-24 DIAGNOSIS — E109 Type 1 diabetes mellitus without complications: Secondary | ICD-10-CM | POA: Diagnosis not present

## 2020-02-27 DIAGNOSIS — Z992 Dependence on renal dialysis: Secondary | ICD-10-CM | POA: Diagnosis not present

## 2020-02-27 DIAGNOSIS — N2581 Secondary hyperparathyroidism of renal origin: Secondary | ICD-10-CM | POA: Diagnosis not present

## 2020-02-27 DIAGNOSIS — D509 Iron deficiency anemia, unspecified: Secondary | ICD-10-CM | POA: Diagnosis not present

## 2020-02-27 DIAGNOSIS — E1129 Type 2 diabetes mellitus with other diabetic kidney complication: Secondary | ICD-10-CM | POA: Diagnosis not present

## 2020-02-27 DIAGNOSIS — N186 End stage renal disease: Secondary | ICD-10-CM | POA: Diagnosis not present

## 2020-02-28 ENCOUNTER — Telehealth: Payer: Self-pay

## 2020-02-28 ENCOUNTER — Inpatient Hospital Stay: Payer: Medicare Other

## 2020-02-28 ENCOUNTER — Other Ambulatory Visit: Payer: Self-pay

## 2020-02-28 VITALS — BP 121/43 | HR 91 | Temp 98.2°F | Resp 18

## 2020-02-28 DIAGNOSIS — Z5112 Encounter for antineoplastic immunotherapy: Secondary | ICD-10-CM | POA: Diagnosis not present

## 2020-02-28 DIAGNOSIS — C9000 Multiple myeloma not having achieved remission: Secondary | ICD-10-CM

## 2020-02-28 DIAGNOSIS — N189 Chronic kidney disease, unspecified: Secondary | ICD-10-CM | POA: Diagnosis not present

## 2020-02-28 DIAGNOSIS — D631 Anemia in chronic kidney disease: Secondary | ICD-10-CM | POA: Diagnosis not present

## 2020-02-28 DIAGNOSIS — Z992 Dependence on renal dialysis: Secondary | ICD-10-CM | POA: Diagnosis not present

## 2020-02-28 LAB — CMP (CANCER CENTER ONLY)
ALT: 54 U/L — ABNORMAL HIGH (ref 0–44)
AST: 30 U/L (ref 15–41)
Albumin: 4.2 g/dL (ref 3.5–5.0)
Alkaline Phosphatase: 162 U/L — ABNORMAL HIGH (ref 38–126)
Anion gap: 16 — ABNORMAL HIGH (ref 5–15)
BUN: 39 mg/dL — ABNORMAL HIGH (ref 6–20)
CO2: 30 mmol/L (ref 22–32)
Calcium: 9.1 mg/dL (ref 8.9–10.3)
Chloride: 93 mmol/L — ABNORMAL LOW (ref 98–111)
Creatinine: 7.98 mg/dL (ref 0.61–1.24)
GFR, Est AFR Am: 8 mL/min — ABNORMAL LOW (ref >60)
GFR, Estimated: 7 mL/min — ABNORMAL LOW (ref >60)
Glucose, Bld: 124 mg/dL — ABNORMAL HIGH (ref 70–99)
Potassium: 4.5 mmol/L (ref 3.5–5.1)
Sodium: 139 mmol/L (ref 135–145)
Total Bilirubin: 1.2 mg/dL (ref 0.3–1.2)
Total Protein: 7.4 g/dL (ref 6.5–8.1)

## 2020-02-28 LAB — CBC WITH DIFFERENTIAL (CANCER CENTER ONLY)
Abs Immature Granulocytes: 0.06 10*3/uL (ref 0.00–0.07)
Basophils Absolute: 0.1 10*3/uL (ref 0.0–0.1)
Basophils Relative: 1 %
Eosinophils Absolute: 0.5 10*3/uL (ref 0.0–0.5)
Eosinophils Relative: 3 %
HCT: 32.8 % — ABNORMAL LOW (ref 39.0–52.0)
Hemoglobin: 12 g/dL — ABNORMAL LOW (ref 13.0–17.0)
Immature Granulocytes: 0 %
Lymphocytes Relative: 3 %
Lymphs Abs: 0.4 10*3/uL — ABNORMAL LOW (ref 0.7–4.0)
MCH: 32.6 pg (ref 26.0–34.0)
MCHC: 36.6 g/dL — ABNORMAL HIGH (ref 30.0–36.0)
MCV: 89.1 fL (ref 80.0–100.0)
Monocytes Absolute: 1 10*3/uL (ref 0.1–1.0)
Monocytes Relative: 6 %
Neutro Abs: 13 10*3/uL — ABNORMAL HIGH (ref 1.7–7.7)
Neutrophils Relative %: 87 %
Platelet Count: 256 10*3/uL (ref 150–400)
RBC: 3.68 MIL/uL — ABNORMAL LOW (ref 4.22–5.81)
RDW: 17.2 % — ABNORMAL HIGH (ref 11.5–15.5)
WBC Count: 15 10*3/uL — ABNORMAL HIGH (ref 4.0–10.5)
nRBC: 17.8 % — ABNORMAL HIGH (ref 0.0–0.2)

## 2020-02-28 MED ORDER — PROCHLORPERAZINE MALEATE 10 MG PO TABS
ORAL_TABLET | ORAL | Status: AC
  Filled 2020-02-28: qty 1

## 2020-02-28 MED ORDER — DIPHENHYDRAMINE HCL 25 MG PO CAPS
ORAL_CAPSULE | ORAL | Status: AC
  Filled 2020-02-28: qty 2

## 2020-02-28 MED ORDER — DARATUMUMAB-HYALURONIDASE-FIHJ 1800-30000 MG-UT/15ML ~~LOC~~ SOLN
1800.0000 mg | Freq: Once | SUBCUTANEOUS | Status: AC
  Administered 2020-02-28: 1800 mg via SUBCUTANEOUS
  Filled 2020-02-28: qty 15

## 2020-02-28 MED ORDER — ACETAMINOPHEN 325 MG PO TABS
ORAL_TABLET | ORAL | Status: AC
  Filled 2020-02-28: qty 2

## 2020-02-28 MED ORDER — BORTEZOMIB CHEMO SQ INJECTION 3.5 MG (2.5MG/ML)
1.5000 mg/m2 | Freq: Once | INTRAMUSCULAR | Status: AC
  Administered 2020-02-28: 3 mg via SUBCUTANEOUS
  Filled 2020-02-28: qty 1.2

## 2020-02-28 MED ORDER — DIPHENHYDRAMINE HCL 25 MG PO CAPS
50.0000 mg | ORAL_CAPSULE | Freq: Once | ORAL | Status: AC
  Administered 2020-02-28: 50 mg via ORAL

## 2020-02-28 MED ORDER — PROCHLORPERAZINE MALEATE 10 MG PO TABS
10.0000 mg | ORAL_TABLET | Freq: Once | ORAL | Status: AC
  Administered 2020-02-28: 10 mg via ORAL

## 2020-02-28 MED ORDER — ACETAMINOPHEN 325 MG PO TABS
650.0000 mg | ORAL_TABLET | Freq: Once | ORAL | Status: AC
  Administered 2020-02-28: 650 mg via ORAL

## 2020-02-28 NOTE — Patient Instructions (Signed)
[name] Discharge Instructions for Patients Receiving Chemotherapy  Today you received the following chemotherapy agents: Darzalex Faspro  To help prevent nausea and vomiting after your treatment, we encourage you to take your nausea medication as directed.    If you develop nausea and vomiting that is not controlled by your nausea medication, call the clinic.   BELOW ARE SYMPTOMS THAT SHOULD BE REPORTED IMMEDIATELY:  *FEVER GREATER THAN 100.5 F  *CHILLS WITH OR WITHOUT FEVER  NAUSEA AND VOMITING THAT IS NOT CONTROLLED WITH YOUR NAUSEA MEDICATION  *UNUSUAL SHORTNESS OF BREATH  *UNUSUAL BRUISING OR BLEEDING  TENDERNESS IN MOUTH AND THROAT WITH OR WITHOUT PRESENCE OF ULCERS  *URINARY PROBLEMS  *BOWEL PROBLEMS  UNUSUAL RASH Items with * indicate a potential emergency and should be followed up as soon as possible.  Feel free to call the clinic should you have any questions or concerns. The clinic phone number is [phone].  Please show the [facility] [name] at check-in to the Emergency Department and triage nurse.

## 2020-02-29 ENCOUNTER — Ambulatory Visit: Payer: Medicare Other

## 2020-02-29 DIAGNOSIS — E1122 Type 2 diabetes mellitus with diabetic chronic kidney disease: Secondary | ICD-10-CM | POA: Diagnosis not present

## 2020-02-29 DIAGNOSIS — D509 Iron deficiency anemia, unspecified: Secondary | ICD-10-CM | POA: Diagnosis not present

## 2020-02-29 DIAGNOSIS — N186 End stage renal disease: Secondary | ICD-10-CM | POA: Diagnosis not present

## 2020-02-29 DIAGNOSIS — E1129 Type 2 diabetes mellitus with other diabetic kidney complication: Secondary | ICD-10-CM | POA: Diagnosis not present

## 2020-02-29 DIAGNOSIS — C9000 Multiple myeloma not having achieved remission: Secondary | ICD-10-CM

## 2020-02-29 DIAGNOSIS — N2581 Secondary hyperparathyroidism of renal origin: Secondary | ICD-10-CM | POA: Diagnosis not present

## 2020-02-29 DIAGNOSIS — Z992 Dependence on renal dialysis: Secondary | ICD-10-CM | POA: Diagnosis not present

## 2020-02-29 NOTE — Chronic Care Management (AMB) (Signed)
Chronic Care Management    Social Work Follow Up Note  02/29/2020 Name: Isaiah [name] [name] MRN: [id number] DOB: Apr 09, 1966  Isaiah [name] is a 54 y.o. year old male who is a primary care patient of Isaiah Chard, MD. The CCM team was consulted for assistance with care coordination.   Review of patient status, including review of consultants reports, other relevant assessments, and collaboration with appropriate care team members and the patient's provider was performed as part of comprehensive patient evaluation and provision of chronic care management services.    SDOH (Social Determinants of Health) assessments performed: No    Outpatient Encounter Medications as of 02/29/2020  Medication Sig Note  . acetaminophen (TYLENOL) 325 MG tablet Take 1-2 tablets (325-650 mg total) by mouth every 4 (four) hours as needed for mild pain.   [name] acyclovir (ZOVIRAX) 400 MG tablet TAKE 1 TABLET BY MOUTH ONCE DAILY   . albuterol (PROAIR HFA) 108 (90 Base) MCG/ACT inhaler Inhale two puffs every 4-6 hours if needed for cough or wheeze   . Alcohol Swabs (SM ALCOHOL PREP) 70 % PADS    . AURYXIA 1 GM 210 MG(Fe) tablet Take 210 mg by mouth 3 (three) times daily.   [name] BREO ELLIPTA 200-25 MCG/INH AEPB INHALE 1 PUFF BY MOUTH AT THE SAME TIME EVERY DAY   . calcitRIOL (ROCALTROL) 0.25 MCG capsule Take by mouth 3 (three) times a week.   . calcium carbonate (TUMS - DOSED IN MG ELEMENTAL CALCIUM) 500 MG chewable tablet Chew 2 tablets by mouth at bedtime. 10/04/2019: On hold per MD  . camphor-menthol (SARNA) lotion Apply 1 application topically every 8 (eight) hours as needed for itching.   . Cetirizine HCl 10 MG CAPS Take 1 capsule by mouth daily.   . cinacalcet (SENSIPAR) 60 MG tablet Take 60 mg by mouth every evening. ON HOLD FOR DIALYSIS PER WIFE   . clonazePAM (KLONOPIN) 0.5 MG tablet Take 2 tablets every Monday, Wednesday, and Friday with hemodialysis   . DARZALEX FASPRO 1800-30000 MG-UT/15ML SOLN Inject 1   .  dexamethasone (DECADRON) 4 MG tablet Take 5 tablets (20 mg total) by mouth once a week. Take 5 tablets (20 mg total) by mouth once a week on day of chemo.   [name] FREESTYLE LITE test strip    . hydrOXYzine (ATARAX/VISTARIL) 25 MG tablet Take 1 tablet (25 mg total) by mouth every 8 (eight) hours as needed for itching.   . Insulin Glargine (BASAGLAR KWIKPEN) 100 UNIT/ML 8 Units.    . Insulin Lispro (HUMALOG KWIKPEN [place]) Inject 2-4 Units into the skin 3 (three) times daily before meals. Sliding scale: 150-250 =2 units, 250 -350=3 units, 350 += 4 units (On Hold)   . lacosamide (VIMPAT) 200 MG TABS tablet Take 1 tablet twice a day except on dialysis days M-W-F give 1 tab in AM, 1 tab after dialysis, 1 tab in PM.   . lamoTRIgine (LAMICTAL) 200 MG tablet Take 1 tablet (200 mg total) by mouth 2 (two) times daily.   . Lancets (FREESTYLE) lancets    . Methoxy PEG-Epoetin Beta (MIRCERA IJ) Mircera   . midodrine (PROAMATINE) 5 MG tablet Take one tablet by mouth 30 minutes before dialysis.   [name] multivitamin (RENA-VIT) TABS tablet Take 1 tablet by mouth at bedtime.   . Nutritional Supplements (FEEDING SUPPLEMENT, NEPRO CARB STEADY,) LIQD Take 237 mLs by mouth 3 (three) times daily as needed (Supplement).   . pantoprazole (PROTONIX) 40 MG tablet TAKE 1 TABLET BY MOUTH  ONCE DAILY   . prochlorperazine (COMPAZINE) 10 MG tablet TAKE 1 TABLET (10 MG TOTAL) BY MOUTH EVERY 6 (SIX) HOURS AS NEEDED FOR NAUSEA OR VOMITING.   [name] UNIFINE PENTIPS 31G X 8 MM MISC USE AS DIRECTED DAILY AFTER SUPPER    No facility-administered encounter medications on file as of 02/29/2020.     Goals Addressed            This Visit's Progress   . Collaborate with RN Care Manager to perform apprpriate assessments to determine care management and care coordination needs   On track    Current Barriers:  . Care management and care coordination needs related to DMII, ESRD, and multiple myeloma  which resulted in inpatient hospitalization . ADL IADL  limitations  Clinical Social Work Clinical Goal(s):  [name] Over the next 60 days, patient will collaborate with care management team to assist with care coordination needs in response to recent inpatient hospitalization . New 01/16/20- Over the next 20 days the patient and his spouse will work with SW to identify cause of Medicaid denial and initiate an appeal Goal Met . New 01/25/20 Over the next 60 days the patient and his wife will work with SW to address ongoing care coordination needs related to caregiver assistance  CCM SW Interventions: Completed 02/29/20 with [name] . Successful outbound call placed to the patients spouse to review goal progression . Determined Isaiah [name] has been in contact with [name] (contact made on 6/22)  who has assigned a nurse to perform an assessment to determine patient eligibility for CAP/DA services o Isaiah [name] informed Isaiah [name] to expect a call over the next 10 days to schedule assessment . Encouraged Isaiah [name] to contact SW with outcome of assessment and/or if further SW assistance is needed  . Scheduled follow up call over the next 6 weeks   Patient Self Care Activities:  . Attends all scheduled provider appointments . Calls pharmacy for medication refills . Calls provider office for new concerns or questions . Supportive wife and caregiver to assist with patient care needs   Please see past updates related to this goal by clicking on the "Past Updates" button in the selected goal         Follow Up Plan: SW will follow up with patient by phone over the next 6 weeks.   [name], BSW, CDP Social Worker, Certified Dementia Practitioner [place] [facility] [place] [facility] [phone]  Total time spent performing care coordination and/or care management activities with the patient by phone or face to face = 10 minutes.

## 2020-02-29 NOTE — Patient Instructions (Signed)
Social Worker Visit Information  Goals we discussed today:  Goals Addressed            This Visit's Progress   . Collaborate with RN Care Manager to perform apprpriate assessments to determine care management and care coordination needs   On track    Current Barriers:  . Care management and care coordination needs related to DMII, ESRD, and multiple myeloma  which resulted in inpatient hospitalization . ADL IADL limitations  Clinical Social Work Clinical Goal(s):  [name] Over the next 60 days, patient will collaborate with care management team to assist with care coordination needs in response to recent inpatient hospitalization . New 01/16/20- Over the next 20 days the patient and his spouse will work with SW to identify cause of Medicaid denial and initiate an appeal Goal Met . New 01/25/20 Over the next 60 days the patient and his wife will work with SW to address ongoing care coordination needs related to caregiver assistance  CCM SW Interventions: Completed 02/29/20 with [facility] . Successful outbound call placed to the patients spouse to review goal progression . Determined Mrs. [facility] has been in contact with Ilda Mori (contact made on 6/22)  who has assigned a nurse to perform an assessment to determine patient eligibility for CAP/DA services o Mrs. Larence Penning informed Mrs. Mckelvin to expect a call over the next 10 days to schedule assessment . Encouraged Mrs. [place] to contact SW with outcome of assessment and/or if further SW assistance is needed  . Scheduled follow up call over the next 6 weeks   Patient Self Care Activities:  . Attends all scheduled provider appointments . Calls pharmacy for medication refills . Calls provider office for new concerns or questions . Supportive wife and caregiver to assist with patient care needs   Please see past updates related to this goal by clicking on the "Past Updates" button in the selected goal         Follow Up Plan: SW will  follow up with patient by phone over the next 6 weeks.  [name], BSW, CDP Social Worker, Certified Dementia Practitioner [place] [facility] [place] [facility] [phone]

## 2020-03-01 DIAGNOSIS — I10 Essential (primary) hypertension: Secondary | ICD-10-CM | POA: Diagnosis not present

## 2020-03-01 DIAGNOSIS — E109 Type 1 diabetes mellitus without complications: Secondary | ICD-10-CM | POA: Diagnosis not present

## 2020-03-01 DIAGNOSIS — C9000 Multiple myeloma not having achieved remission: Secondary | ICD-10-CM | POA: Diagnosis not present

## 2020-03-01 DIAGNOSIS — D649 Anemia, unspecified: Secondary | ICD-10-CM | POA: Diagnosis not present

## 2020-03-01 DIAGNOSIS — N186 End stage renal disease: Secondary | ICD-10-CM | POA: Diagnosis not present

## 2020-03-02 ENCOUNTER — Encounter: Payer: Self-pay | Admitting: Internal Medicine

## 2020-03-02 DIAGNOSIS — N186 End stage renal disease: Secondary | ICD-10-CM | POA: Diagnosis not present

## 2020-03-02 DIAGNOSIS — D509 Iron deficiency anemia, unspecified: Secondary | ICD-10-CM | POA: Diagnosis not present

## 2020-03-02 DIAGNOSIS — N2581 Secondary hyperparathyroidism of renal origin: Secondary | ICD-10-CM | POA: Diagnosis not present

## 2020-03-02 DIAGNOSIS — Z992 Dependence on renal dialysis: Secondary | ICD-10-CM | POA: Diagnosis not present

## 2020-03-02 DIAGNOSIS — E1129 Type 2 diabetes mellitus with other diabetic kidney complication: Secondary | ICD-10-CM | POA: Diagnosis not present

## 2020-03-05 DIAGNOSIS — N186 End stage renal disease: Secondary | ICD-10-CM | POA: Diagnosis not present

## 2020-03-05 DIAGNOSIS — D509 Iron deficiency anemia, unspecified: Secondary | ICD-10-CM | POA: Diagnosis not present

## 2020-03-05 DIAGNOSIS — Z992 Dependence on renal dialysis: Secondary | ICD-10-CM | POA: Diagnosis not present

## 2020-03-05 DIAGNOSIS — E1129 Type 2 diabetes mellitus with other diabetic kidney complication: Secondary | ICD-10-CM | POA: Diagnosis not present

## 2020-03-05 DIAGNOSIS — N2581 Secondary hyperparathyroidism of renal origin: Secondary | ICD-10-CM | POA: Diagnosis not present

## 2020-03-05 MED FILL — AURYXIA 210 MG TABLET: 1 GM | 30 days supply | Qty: 90 | Fill #3

## 2020-03-05 MED FILL — DEXAMETHASONE 4 MG TABLET: 4 | 56 days supply | Qty: 40 | Fill #3

## 2020-03-05 MED FILL — VIMPAT 200 MG TABLET: 200 | 37 days supply | Qty: 90 | Fill #1

## 2020-03-06 ENCOUNTER — Inpatient Hospital Stay: Payer: Medicare Other

## 2020-03-06 ENCOUNTER — Other Ambulatory Visit: Payer: Self-pay

## 2020-03-06 VITALS — BP 106/62 | HR 84 | Temp 99.0°F | Resp 20 | Ht 70.0 in | Wt 180.5 lb

## 2020-03-06 DIAGNOSIS — C9000 Multiple myeloma not having achieved remission: Secondary | ICD-10-CM

## 2020-03-06 DIAGNOSIS — D631 Anemia in chronic kidney disease: Secondary | ICD-10-CM | POA: Diagnosis not present

## 2020-03-06 DIAGNOSIS — Z5112 Encounter for antineoplastic immunotherapy: Secondary | ICD-10-CM | POA: Diagnosis not present

## 2020-03-06 DIAGNOSIS — Z992 Dependence on renal dialysis: Secondary | ICD-10-CM | POA: Diagnosis not present

## 2020-03-06 DIAGNOSIS — N189 Chronic kidney disease, unspecified: Secondary | ICD-10-CM | POA: Diagnosis not present

## 2020-03-06 LAB — CMP (CANCER CENTER ONLY)
ALT: 35 U/L (ref 0–44)
AST: 19 U/L (ref 15–41)
Albumin: 4.1 g/dL (ref 3.5–5.0)
Alkaline Phosphatase: 148 U/L — ABNORMAL HIGH (ref 38–126)
Anion gap: 16 — ABNORMAL HIGH (ref 5–15)
BUN: 38 mg/dL — ABNORMAL HIGH (ref 6–20)
CO2: 29 mmol/L (ref 22–32)
Calcium: 9 mg/dL (ref 8.9–10.3)
Chloride: 94 mmol/L — ABNORMAL LOW (ref 98–111)
Creatinine: 7.77 mg/dL (ref 0.61–1.24)
GFR, Est AFR Am: 8 mL/min — ABNORMAL LOW (ref >60)
GFR, Estimated: 7 mL/min — ABNORMAL LOW (ref >60)
Glucose, Bld: 155 mg/dL — ABNORMAL HIGH (ref 70–99)
Potassium: 5.1 mmol/L (ref 3.5–5.1)
Sodium: 139 mmol/L (ref 135–145)
Total Bilirubin: 1 mg/dL (ref 0.3–1.2)
Total Protein: 7.2 g/dL (ref 6.5–8.1)

## 2020-03-06 LAB — CBC WITH DIFFERENTIAL (CANCER CENTER ONLY)
Abs Immature Granulocytes: 0.04 10*3/uL (ref 0.00–0.07)
Basophils Absolute: 0.1 10*3/uL (ref 0.0–0.1)
Basophils Relative: 1 %
Eosinophils Absolute: 0.2 10*3/uL (ref 0.0–0.5)
Eosinophils Relative: 2 %
HCT: 31.3 % — ABNORMAL LOW (ref 39.0–52.0)
Hemoglobin: 11.5 g/dL — ABNORMAL LOW (ref 13.0–17.0)
Immature Granulocytes: 0 %
Lymphocytes Relative: 2 %
Lymphs Abs: 0.2 10*3/uL — ABNORMAL LOW (ref 0.7–4.0)
MCH: 32.1 pg (ref 26.0–34.0)
MCHC: 36.7 g/dL — ABNORMAL HIGH (ref 30.0–36.0)
MCV: 87.4 fL (ref 80.0–100.0)
Monocytes Absolute: 0.3 10*3/uL (ref 0.1–1.0)
Monocytes Relative: 2 %
Neutro Abs: 11.2 10*3/uL — ABNORMAL HIGH (ref 1.7–7.7)
Neutrophils Relative %: 93 %
Platelet Count: 259 10*3/uL (ref 150–400)
RBC: 3.58 MIL/uL — ABNORMAL LOW (ref 4.22–5.81)
RDW: 16.9 % — ABNORMAL HIGH (ref 11.5–15.5)
WBC Count: 12 10*3/uL — ABNORMAL HIGH (ref 4.0–10.5)
nRBC: 23.8 % — ABNORMAL HIGH (ref 0.0–0.2)

## 2020-03-06 MED ORDER — BORTEZOMIB CHEMO SQ INJECTION 3.5 MG (2.5MG/ML)
1.5000 mg/m2 | Freq: Once | INTRAMUSCULAR | Status: AC
  Administered 2020-03-06: 3 mg via SUBCUTANEOUS
  Filled 2020-03-06: qty 1.2

## 2020-03-06 MED ORDER — PROCHLORPERAZINE MALEATE 10 MG PO TABS
ORAL_TABLET | ORAL | Status: AC
  Filled 2020-03-06: qty 1

## 2020-03-06 MED ORDER — PROCHLORPERAZINE MALEATE 10 MG PO TABS
10.0000 mg | ORAL_TABLET | Freq: Once | ORAL | Status: AC
  Administered 2020-03-06: 10 mg via ORAL

## 2020-03-06 NOTE — Patient Instructions (Signed)
[facility] Discharge Instructions for Patients Receiving Chemotherapy  Today you received the following chemotherapy agents Bortezomib (VELCADE).  To help prevent nausea and vomiting after your treatment, we encourage you to take your nausea medication as prescribed.   If you develop nausea and vomiting that is not controlled by your nausea medication, call the clinic.   BELOW ARE SYMPTOMS THAT SHOULD BE REPORTED IMMEDIATELY:  *FEVER GREATER THAN 100.5 F  *CHILLS WITH OR WITHOUT FEVER  NAUSEA AND VOMITING THAT IS NOT CONTROLLED WITH YOUR NAUSEA MEDICATION  *UNUSUAL SHORTNESS OF BREATH  *UNUSUAL BRUISING OR BLEEDING  TENDERNESS IN MOUTH AND THROAT WITH OR WITHOUT PRESENCE OF ULCERS  *URINARY PROBLEMS  *BOWEL PROBLEMS  UNUSUAL RASH Items with * indicate a potential emergency and should be followed up as soon as possible.  Feel free to call the clinic should you have any questions or concerns. The clinic phone number is [phone].  Please show the [place] at check-in to the Emergency Department and triage nurse.

## 2020-03-06 NOTE — Progress Notes (Signed)
LATE ENTRY-Called critical creatinine of 7.77 to Wendall Mola RN after receiving the result from lab.  Gardiner Rhyme, RN

## 2020-03-07 DIAGNOSIS — E1129 Type 2 diabetes mellitus with other diabetic kidney complication: Secondary | ICD-10-CM | POA: Diagnosis not present

## 2020-03-07 DIAGNOSIS — N186 End stage renal disease: Secondary | ICD-10-CM | POA: Diagnosis not present

## 2020-03-07 DIAGNOSIS — Z992 Dependence on renal dialysis: Secondary | ICD-10-CM | POA: Diagnosis not present

## 2020-03-07 DIAGNOSIS — D509 Iron deficiency anemia, unspecified: Secondary | ICD-10-CM | POA: Diagnosis not present

## 2020-03-07 DIAGNOSIS — N2581 Secondary hyperparathyroidism of renal origin: Secondary | ICD-10-CM | POA: Diagnosis not present

## 2020-03-07 MED FILL — BREO ELLIPTA 200-25 MCG INH: 200-25 | 30 days supply | Qty: 60 | Fill #1

## 2020-03-08 ENCOUNTER — Encounter: Payer: Medicare Other | Attending: Physical Medicine & Rehabilitation | Admitting: Physical Medicine & Rehabilitation

## 2020-03-08 ENCOUNTER — Other Ambulatory Visit: Payer: Self-pay

## 2020-03-08 ENCOUNTER — Encounter: Payer: Self-pay | Admitting: Physical Medicine & Rehabilitation

## 2020-03-08 ENCOUNTER — Other Ambulatory Visit (HOSPITAL_COMMUNITY): Payer: Self-pay | Admitting: Endocrinology

## 2020-03-08 VITALS — BP 136/77 | HR 80 | Temp 98.8°F | Ht 70.0 in | Wt 190.0 lb

## 2020-03-08 DIAGNOSIS — R269 Unspecified abnormalities of gait and mobility: Secondary | ICD-10-CM | POA: Diagnosis not present

## 2020-03-08 DIAGNOSIS — G8110 Spastic hemiplegia affecting unspecified side: Secondary | ICD-10-CM | POA: Insufficient documentation

## 2020-03-08 DIAGNOSIS — N186 End stage renal disease: Secondary | ICD-10-CM | POA: Diagnosis not present

## 2020-03-08 DIAGNOSIS — R7881 Bacteremia: Secondary | ICD-10-CM | POA: Diagnosis not present

## 2020-03-08 DIAGNOSIS — Z992 Dependence on renal dialysis: Secondary | ICD-10-CM | POA: Insufficient documentation

## 2020-03-08 DIAGNOSIS — S065XAA Traumatic subdural hemorrhage with loss of consciousness status unknown, initial encounter: Secondary | ICD-10-CM

## 2020-03-08 DIAGNOSIS — E1129 Type 2 diabetes mellitus with other diabetic kidney complication: Secondary | ICD-10-CM | POA: Diagnosis not present

## 2020-03-08 DIAGNOSIS — D509 Iron deficiency anemia, unspecified: Secondary | ICD-10-CM | POA: Diagnosis not present

## 2020-03-08 DIAGNOSIS — G40909 Epilepsy, unspecified, not intractable, without status epilepticus: Secondary | ICD-10-CM | POA: Diagnosis not present

## 2020-03-08 DIAGNOSIS — D631 Anemia in chronic kidney disease: Secondary | ICD-10-CM | POA: Diagnosis not present

## 2020-03-08 DIAGNOSIS — N2581 Secondary hyperparathyroidism of renal origin: Secondary | ICD-10-CM | POA: Diagnosis not present

## 2020-03-08 DIAGNOSIS — S065X9A Traumatic subdural hemorrhage with loss of consciousness of unspecified duration, initial encounter: Secondary | ICD-10-CM | POA: Insufficient documentation

## 2020-03-08 DIAGNOSIS — E1122 Type 2 diabetes mellitus with diabetic chronic kidney disease: Secondary | ICD-10-CM | POA: Diagnosis not present

## 2020-03-08 MED FILL — BASAGLAR 100 UNIT/ML KWIKPE: 100 | 90 days supply | Qty: 9 | Fill #0

## 2020-03-08 MED FILL — SUBVENITE 200 MG TABS: 200 | 30 days supply | Qty: 60 | Fill #5

## 2020-03-08 NOTE — Progress Notes (Signed)
Subjective:    Patient ID: Isaiah [name], male    DOB: [birth year], 54 y.o.   MRN: [id number]  HPI [place] history of T2DM, A fib, ESRD-HD MWF, CVA, DVT/PE s/p IVC filter, seizure disorder, MM with recent admission to [facility] for stem cell transplant but procedure aborted due to findings of proteus mirabilis bacteremia, aspiration PNA, acute on chronic SDH, findings of bilateral renal calculi as well as breakthrough seizures presents for follow up for SDH.  Last clinic visit on 12/08/19.  Wife supplements history.  Since that time, pt states he completed therapies, doing HEP. He continues to follow up with Neuro.  No breakthrough seizures. Spasticity is controlled. Had a fall a couple months, losing balance with rolling walker.   Pain Inventory Average Pain 0 Pain Right Now 0 My pain is n/a  In the last 24 hours, has pain interfered with the following? General activity 0 Relation with others 0 Enjoyment of life 0 What TIME of day is your pain at its worst? n/a Sleep (in general) Good  Pain is worse with: n/a Pain improves with: n/a Relief from Meds: 0  Mobility walk with assistance use a walker how many minutes can you walk? 30 ability to climb steps?  yes do you drive?  no  Function disabled: date disabled 2017  Neuro/Psych weakness trouble walking  Prior Studies Any changes since last visit?  no  Physicians involved in your care Neurologist .   Family History  Problem Relation Age of Onset  . Hypertension Mother   . Hypertension Father   . Stroke Father   . Sickle cell anemia Brother   . Diabetes Mother   . Sickle cell anemia Brother   . Diabetes type I Brother   . Kidney disease Brother    Social History   Socioeconomic History  . Marital status: Married    Spouse name: Isaiah [name]  . Number of children: 1  . Years of education: college  . Highest education level: Not on file  Occupational History  . Occupation: [employer]  . Occupation:  disability  Tobacco Use  . Smoking status: Never Smoker  . Smokeless tobacco: Never Used  Vaping Use  . Vaping Use: Never used  Substance and Sexual Activity  . Alcohol use: Not Currently  . Drug use: Never  . Sexual activity: Yes    Partners: Female  Other Topics Concern  . Not on file  Social History Narrative   ** Merged History Encounter **       Pt lives in 2 story home with his wife and 1 son Has masters degree in psychology Currently disabled.     Social Determinants of Health   Financial Resource Strain: Low Risk   . Difficulty of Paying Living Expenses: Not hard at all  Food Insecurity: No Food Insecurity  . Worried About Charity fundraiser in the Last Year: Never true  . Ran Out of Food in the Last Year: Never true  Transportation Needs: No Transportation Needs  . Lack of Transportation (Medical): No  . Lack of Transportation (Non-Medical): No  Physical Activity: Inactive  . Days of Exercise per Week: 0 days  . Minutes of Exercise per Session: 0 min  Stress: No Stress Concern Present  . Feeling of Stress : Not at all  Social Connections:   . Frequency of Communication with Friends and Family:   . Frequency of Social Gatherings with Friends and Family:   . Attends Religious Services:   .  Active Member of Clubs or Organizations:   . Attends Archivist Meetings:   [name] Marital Status:    Past Surgical History:  Procedure Laterality Date  . AV FISTULA PLACEMENT Right 12/05/2015   Procedure: INSERTION OF ARTERIOVENOUS (AV) GORE-TEX GRAFT ARM;  Surgeon: Elam Dutch, MD;  Location: [place];  Service: Vascular;  Laterality: Right;  . BONE MARROW BIOPSY    . CATARACT EXTRACTION  08/2019  . CATARACT EXTRACTION  09/2019  . CHOLECYSTECTOMY    . CHOLECYSTECTOMY  1990's?  . COLONOSCOPY WITH PROPOFOL N/A 01/22/2017   Procedure: COLONOSCOPY WITH PROPOFOL;  Surgeon: Wilford Corner, MD;  Location: [facility];  Service: Endoscopy;  Laterality: N/A;  . EYE  SURGERY Right   . INSERTION OF DIALYSIS CATHETER Right 10/16/2015   Procedure: INSERTION OF PALINDROME DIALYSIS CATHETER ;  Surgeon: Elam Dutch, MD;  Location: [facility];  Service: Vascular;  Laterality: Right;  . OTHER SURGICAL HISTORY     Retinal surgery  . PARS PLANA VITRECTOMY  02/17/2012   Procedure: PARS PLANA VITRECTOMY WITH 25 GAUGE;  Surgeon: Hayden Pedro, MD;  Location: [facility];  Service: Ophthalmology;  Laterality: Right;  . PERIPHERAL VASCULAR CATHETERIZATION N/A 03/20/2016   Procedure: A/V Shuntogram/Fistulagram;  Surgeon: Conrad Trimont, MD;  Location: [facility];  Service: Cardiovascular;  Laterality: N/A;  . TEE WITHOUT CARDIOVERSION N/A 12/02/2016   Procedure: TRANSESOPHAGEAL ECHOCARDIOGRAM (TEE);  Surgeon: Larey Dresser, MD;  Location: [place];  Service: Cardiovascular;  Laterality: N/A;   Past Medical History:  Diagnosis Date  . A-fib ([place])   . Anemia   . Asthma   . DM type 2 (diabetes mellitus, type 2) ([place]) 06/09/2019  . ESRD (end stage renal disease) on dialysis ([place])   . Essential hypertension 06/09/2019  . GIB (gastrointestinal bleeding)    Recurrent episodes- 09/2014, 09/2015 and 05/2016  . Gout   . History of recent blood transfusion 10/27/14   2 Units PRBC's  . Hyperkalemia 07/2011  . Multiple myeloma ([place])   . OSA on CPAP   . Pulmonary embolism ([place]) 07/2011; 09/27/2014   a. Bilat PE 07/2011 - unclear cause, tx with 6 months Coumadin.;   . Seizure disorder ([place]) 06/09/2019  . Sepsis ([place])   . Sickle cell-thalassemia disease ([place])    a. Sickle cell trait.  . Sleep apnea   . Stroke ([place]) 09/2015   R-MCA, L-MCA, PCA and bilateral cerebellar complicated by DVT/PE  . Subdural hematoma ([facility]) 05/2019   BP 136/77   Pulse 80   Temp 98.8 F (37.1 C)   Ht 5' 10" (1.778 m)   Wt 190 lb (86.2 kg)   SpO2 99%   BMI 27.26 kg/m   Opioid Risk Score:   Fall Risk Score:  `1  Depression screen PHQ 2/9  Depression screen [name] 2/9 03/08/2020 09/22/2019  09/13/2019 03/17/2019  Decreased Interest 0 0 0 0  Down, Depressed, Hopeless 0 0 0 0  PHQ - 2 Score 0 0 0 0  Altered sleeping - - - 0  Tired, decreased energy - - - 0  Change in appetite - - - 0  Feeling bad or failure about yourself  - - - 0  Trouble concentrating - - - 0  Moving slowly or fidgety/restless - - - 1  Suicidal thoughts - - - 0  PHQ-9 Score - - - 1  Difficult doing work/chores - - - Not difficult at all  Some recent data might be  hidden    Review of Systems  Endocrine:       High blood sugar   Musculoskeletal: Positive for arthralgias, gait problem and myalgias.  Neurological: Positive for seizures and weakness.       Objective:   Physical Exam Constitutional: NAD.  HENT: Normocephalic.  Atraumatic. Respiratory: Normal effort.  No stridor. Psych: Flat. Musc: Gait: Slow cadence Neurologic: Alert HOH Dysarthria, improving Motor: RUE: 4/5 RLE: 4/5 MAS: Right  wrist flexors 1/4   Ankle dorsiflexors 1/4    Assessment & Plan:  Male with history of T2DM, A fib, ESRD-HD MWF, CVA, DVT/PE s/p IVC filter, seizure disorder, MM with recent admission to [facility] for stem cell transplant but procedure aborted due to findings of proteus mirabilis bacteremia, aspiration PNA, acute on chronic SDH, findings of bilateral renal calculi as well as breakthrough seizures presents for follow up for SDH.   1. Impaired ADLs/mobility and function secondary to acute on chronic SDH with 5 mm of midline shift to left- with R hemiparesis and dysarthria- f/u with NSU after rehab; no head CT unless has a change in function, per NSU             Continue HEP, will refer to outpatient therapies  Cont follow up with Neurology   2. Seizure disorder:              Antiepilectics adjusted             Cont follow up with Neurology  No breakthrough seizures   3. Spasticity in setting of 2017 CVA with previous R hemiparesis             Controlled at present   4. Gait abnormality with falls              Continue walker for safety             Referral to theraies

## 2020-03-09 DIAGNOSIS — D631 Anemia in chronic kidney disease: Secondary | ICD-10-CM | POA: Diagnosis not present

## 2020-03-09 DIAGNOSIS — Z992 Dependence on renal dialysis: Secondary | ICD-10-CM | POA: Diagnosis not present

## 2020-03-09 DIAGNOSIS — D509 Iron deficiency anemia, unspecified: Secondary | ICD-10-CM | POA: Diagnosis not present

## 2020-03-09 DIAGNOSIS — E1129 Type 2 diabetes mellitus with other diabetic kidney complication: Secondary | ICD-10-CM | POA: Diagnosis not present

## 2020-03-09 DIAGNOSIS — N2581 Secondary hyperparathyroidism of renal origin: Secondary | ICD-10-CM | POA: Diagnosis not present

## 2020-03-09 DIAGNOSIS — N186 End stage renal disease: Secondary | ICD-10-CM | POA: Diagnosis not present

## 2020-03-12 DIAGNOSIS — Z992 Dependence on renal dialysis: Secondary | ICD-10-CM | POA: Diagnosis not present

## 2020-03-12 DIAGNOSIS — E1129 Type 2 diabetes mellitus with other diabetic kidney complication: Secondary | ICD-10-CM | POA: Diagnosis not present

## 2020-03-12 DIAGNOSIS — N2581 Secondary hyperparathyroidism of renal origin: Secondary | ICD-10-CM | POA: Diagnosis not present

## 2020-03-12 DIAGNOSIS — D631 Anemia in chronic kidney disease: Secondary | ICD-10-CM | POA: Diagnosis not present

## 2020-03-12 DIAGNOSIS — N186 End stage renal disease: Secondary | ICD-10-CM | POA: Diagnosis not present

## 2020-03-12 DIAGNOSIS — D509 Iron deficiency anemia, unspecified: Secondary | ICD-10-CM | POA: Diagnosis not present

## 2020-03-13 DIAGNOSIS — R2689 Other abnormalities of gait and mobility: Secondary | ICD-10-CM | POA: Diagnosis not present

## 2020-03-13 DIAGNOSIS — F445 Conversion disorder with seizures or convulsions: Secondary | ICD-10-CM | POA: Diagnosis not present

## 2020-03-13 DIAGNOSIS — G8110 Spastic hemiplegia affecting unspecified side: Secondary | ICD-10-CM | POA: Diagnosis not present

## 2020-03-13 DIAGNOSIS — R269 Unspecified abnormalities of gait and mobility: Secondary | ICD-10-CM | POA: Diagnosis not present

## 2020-03-13 DIAGNOSIS — G40909 Epilepsy, unspecified, not intractable, without status epilepticus: Secondary | ICD-10-CM | POA: Diagnosis not present

## 2020-03-14 DIAGNOSIS — Z992 Dependence on renal dialysis: Secondary | ICD-10-CM | POA: Diagnosis not present

## 2020-03-14 DIAGNOSIS — N186 End stage renal disease: Secondary | ICD-10-CM | POA: Diagnosis not present

## 2020-03-14 DIAGNOSIS — E1129 Type 2 diabetes mellitus with other diabetic kidney complication: Secondary | ICD-10-CM | POA: Diagnosis not present

## 2020-03-14 DIAGNOSIS — D631 Anemia in chronic kidney disease: Secondary | ICD-10-CM | POA: Diagnosis not present

## 2020-03-14 DIAGNOSIS — N2581 Secondary hyperparathyroidism of renal origin: Secondary | ICD-10-CM | POA: Diagnosis not present

## 2020-03-14 DIAGNOSIS — D509 Iron deficiency anemia, unspecified: Secondary | ICD-10-CM | POA: Diagnosis not present

## 2020-03-15 ENCOUNTER — Telehealth: Payer: Self-pay

## 2020-03-15 ENCOUNTER — Emergency Department (HOSPITAL_COMMUNITY): Payer: Medicare Other

## 2020-03-15 ENCOUNTER — Other Ambulatory Visit: Payer: Self-pay

## 2020-03-15 ENCOUNTER — Encounter (HOSPITAL_COMMUNITY): Payer: Self-pay | Admitting: Emergency Medicine

## 2020-03-15 ENCOUNTER — Inpatient Hospital Stay (HOSPITAL_COMMUNITY)
Admission: EM | Admit: 2020-03-15 | Discharge: 2020-03-20 | DRG: 385 | Disposition: A | Discharge status: Home / Self Care | Payer: Medicare Other | Attending: Internal Medicine | Admitting: Internal Medicine

## 2020-03-15 DIAGNOSIS — Z86711 Personal history of pulmonary embolism: Secondary | ICD-10-CM

## 2020-03-15 DIAGNOSIS — G4733 Obstructive sleep apnea (adult) (pediatric): Secondary | ICD-10-CM | POA: Diagnosis present

## 2020-03-15 DIAGNOSIS — E1122 Type 2 diabetes mellitus with diabetic chronic kidney disease: Secondary | ICD-10-CM | POA: Diagnosis present

## 2020-03-15 DIAGNOSIS — I480 Paroxysmal atrial fibrillation: Secondary | ICD-10-CM | POA: Diagnosis present

## 2020-03-15 DIAGNOSIS — N2581 Secondary hyperparathyroidism of renal origin: Secondary | ICD-10-CM | POA: Diagnosis present

## 2020-03-15 DIAGNOSIS — Z841 Family history of disorders of kidney and ureter: Secondary | ICD-10-CM

## 2020-03-15 DIAGNOSIS — I120 Hypertensive chronic kidney disease with stage 5 chronic kidney disease or end stage renal disease: Secondary | ICD-10-CM | POA: Diagnosis not present

## 2020-03-15 DIAGNOSIS — Z20822 Contact with and (suspected) exposure to covid-19: Secondary | ICD-10-CM | POA: Diagnosis present

## 2020-03-15 DIAGNOSIS — Z794 Long term (current) use of insulin: Secondary | ICD-10-CM

## 2020-03-15 DIAGNOSIS — K6389 Other specified diseases of intestine: Secondary | ICD-10-CM | POA: Diagnosis not present

## 2020-03-15 DIAGNOSIS — I69351 Hemiplegia and hemiparesis following cerebral infarction affecting right dominant side: Secondary | ICD-10-CM | POA: Diagnosis not present

## 2020-03-15 DIAGNOSIS — Z833 Family history of diabetes mellitus: Secondary | ICD-10-CM

## 2020-03-15 DIAGNOSIS — N261 Atrophy of kidney (terminal): Secondary | ICD-10-CM | POA: Diagnosis not present

## 2020-03-15 DIAGNOSIS — K51511 Left sided colitis with rectal bleeding: Secondary | ICD-10-CM | POA: Diagnosis not present

## 2020-03-15 DIAGNOSIS — Z79899 Other long term (current) drug therapy: Secondary | ICD-10-CM

## 2020-03-15 DIAGNOSIS — C9000 Multiple myeloma not having achieved remission: Secondary | ICD-10-CM | POA: Diagnosis present

## 2020-03-15 DIAGNOSIS — N186 End stage renal disease: Secondary | ICD-10-CM

## 2020-03-15 DIAGNOSIS — K559 Vascular disorder of intestine, unspecified: Secondary | ICD-10-CM | POA: Diagnosis present

## 2020-03-15 DIAGNOSIS — I69322 Dysarthria following cerebral infarction: Secondary | ICD-10-CM

## 2020-03-15 DIAGNOSIS — Z86718 Personal history of other venous thrombosis and embolism: Secondary | ICD-10-CM

## 2020-03-15 DIAGNOSIS — G40909 Epilepsy, unspecified, not intractable, without status epilepticus: Secondary | ICD-10-CM | POA: Diagnosis present

## 2020-03-15 DIAGNOSIS — K625 Hemorrhage of anus and rectum: Secondary | ICD-10-CM | POA: Diagnosis not present

## 2020-03-15 DIAGNOSIS — Z03818 Encounter for observation for suspected exposure to other biological agents ruled out: Secondary | ICD-10-CM | POA: Diagnosis not present

## 2020-03-15 DIAGNOSIS — Z7951 Long term (current) use of inhaled steroids: Secondary | ICD-10-CM

## 2020-03-15 DIAGNOSIS — K5190 Ulcerative colitis, unspecified, without complications: Secondary | ICD-10-CM | POA: Diagnosis present

## 2020-03-15 DIAGNOSIS — I700 Atherosclerosis of aorta: Secondary | ICD-10-CM | POA: Diagnosis not present

## 2020-03-15 DIAGNOSIS — D5740 Sickle-cell thalassemia without crisis: Secondary | ICD-10-CM | POA: Diagnosis not present

## 2020-03-15 DIAGNOSIS — E119 Type 2 diabetes mellitus without complications: Secondary | ICD-10-CM

## 2020-03-15 DIAGNOSIS — Z992 Dependence on renal dialysis: Secondary | ICD-10-CM

## 2020-03-15 DIAGNOSIS — Z8719 Personal history of other diseases of the digestive system: Secondary | ICD-10-CM

## 2020-03-15 DIAGNOSIS — Z95828 Presence of other vascular implants and grafts: Secondary | ICD-10-CM

## 2020-03-15 DIAGNOSIS — Z8249 Family history of ischemic heart disease and other diseases of the circulatory system: Secondary | ICD-10-CM

## 2020-03-15 DIAGNOSIS — Z823 Family history of stroke: Secondary | ICD-10-CM

## 2020-03-15 DIAGNOSIS — N3289 Other specified disorders of bladder: Secondary | ICD-10-CM | POA: Diagnosis not present

## 2020-03-15 DIAGNOSIS — J449 Chronic obstructive pulmonary disease, unspecified: Secondary | ICD-10-CM | POA: Diagnosis present

## 2020-03-15 DIAGNOSIS — D631 Anemia in chronic kidney disease: Secondary | ICD-10-CM | POA: Diagnosis present

## 2020-03-15 DIAGNOSIS — K529 Noninfective gastroenteritis and colitis, unspecified: Secondary | ICD-10-CM | POA: Diagnosis not present

## 2020-03-15 DIAGNOSIS — Z832 Family history of diseases of the blood and blood-forming organs and certain disorders involving the immune mechanism: Secondary | ICD-10-CM

## 2020-03-15 DIAGNOSIS — Z885 Allergy status to narcotic agent status: Secondary | ICD-10-CM

## 2020-03-15 DIAGNOSIS — R197 Diarrhea, unspecified: Secondary | ICD-10-CM | POA: Diagnosis not present

## 2020-03-15 DIAGNOSIS — I953 Hypotension of hemodialysis: Secondary | ICD-10-CM | POA: Diagnosis present

## 2020-03-15 HISTORY — DX: Ulcerative colitis, unspecified, without complications: K51.90

## 2020-03-15 LAB — CBC
HCT: 33.7 % — ABNORMAL LOW (ref 39.0–52.0)
Hemoglobin: 12.3 g/dL — ABNORMAL LOW (ref 13.0–17.0)
MCH: 33.2 pg (ref 26.0–34.0)
MCHC: 36.5 g/dL — ABNORMAL HIGH (ref 30.0–36.0)
MCV: 90.8 fL (ref 80.0–100.0)
Platelets: 283 10*3/uL (ref 150–400)
RBC: 3.71 MIL/uL — ABNORMAL LOW (ref 4.22–5.81)
RDW: 17.1 % — ABNORMAL HIGH (ref 11.5–15.5)
WBC: 15.8 10*3/uL — ABNORMAL HIGH (ref 4.0–10.5)
nRBC: 8.8 % — ABNORMAL HIGH (ref 0.0–0.2)

## 2020-03-15 LAB — COMPREHENSIVE METABOLIC PANEL
ALT: 28 U/L (ref 0–44)
AST: 52 U/L — ABNORMAL HIGH (ref 15–41)
Albumin: 4.1 g/dL (ref 3.5–5.0)
Alkaline Phosphatase: 129 U/L — ABNORMAL HIGH (ref 38–126)
Anion gap: 17 — ABNORMAL HIGH (ref 5–15)
BUN: 42 mg/dL — ABNORMAL HIGH (ref 6–20)
CO2: 29 mmol/L (ref 22–32)
Calcium: 9.2 mg/dL (ref 8.9–10.3)
Chloride: 92 mmol/L — ABNORMAL LOW (ref 98–111)
Creatinine, Ser: 8.68 mg/dL — ABNORMAL HIGH (ref 0.61–1.24)
GFR calc Af Amer: 7 mL/min — ABNORMAL LOW (ref >60)
GFR calc non Af Amer: 6 mL/min — ABNORMAL LOW (ref >60)
Glucose, Bld: 237 mg/dL — ABNORMAL HIGH (ref 70–99)
Potassium: 4.6 mmol/L (ref 3.5–5.1)
Sodium: 138 mmol/L (ref 135–145)
Total Bilirubin: 1.5 mg/dL — ABNORMAL HIGH (ref 0.3–1.2)
Total Protein: 7.1 g/dL (ref 6.5–8.1)

## 2020-03-15 LAB — SAMPLE TO BLOOD BANK

## 2020-03-15 LAB — LIPASE, BLOOD: Lipase: 35 U/L (ref 11–51)

## 2020-03-15 LAB — POC OCCULT BLOOD, ED: Fecal Occult Bld: POSITIVE — AB

## 2020-03-15 LAB — SARS CORONAVIRUS 2 BY RT PCR (HOSPITAL ORDER, PERFORMED IN ~~LOC~~ HOSPITAL LAB): SARS Coronavirus 2: NEGATIVE

## 2020-03-15 LAB — GLUCOSE, CAPILLARY: Glucose-Capillary: 105 mg/dL — ABNORMAL HIGH (ref 70–99)

## 2020-03-15 MED ORDER — MIDODRINE HCL 5 MG PO TABS: 5.0000 mg | ORAL_TABLET | ORAL | Status: DC

## 2020-03-15 MED ORDER — METRONIDAZOLE IN NACL 5-0.79 MG/ML-% IV SOLN
500.0000 mg | Freq: Once | INTRAVENOUS | Status: AC
  Administered 2020-03-15: 500 mg via INTRAVENOUS
  Filled 2020-03-15: qty 100

## 2020-03-15 MED ORDER — ACETAMINOPHEN 650 MG RE SUPP: 650.0000 mg | Freq: Four times a day (QID) | RECTAL | Status: DC | PRN

## 2020-03-15 MED ORDER — INSULIN ASPART 100 UNIT/ML ~~LOC~~ SOLN
0.0000 [IU] | Freq: Three times a day (TID) | SUBCUTANEOUS | Status: DC
  Administered 2020-03-17 – 2020-03-18 (×2): 1 [IU] via SUBCUTANEOUS

## 2020-03-15 MED ORDER — LAMOTRIGINE 100 MG PO TABS
200.0000 mg | ORAL_TABLET | Freq: Two times a day (BID) | ORAL | Status: DC
  Administered 2020-03-16 – 2020-03-20 (×9): 200 mg via ORAL
  Filled 2020-03-15 (×11): qty 2

## 2020-03-15 MED ORDER — SODIUM CHLORIDE 0.9% FLUSH: 3.0000 mL | Freq: Once | INTRAVENOUS | Status: DC

## 2020-03-15 MED ORDER — LACOSAMIDE 200 MG PO TABS
200.0000 mg | ORAL_TABLET | ORAL | Status: DC
  Administered 2020-03-17 – 2020-03-20 (×5): 200 mg via ORAL
  Filled 2020-03-15 (×5): qty 1

## 2020-03-15 MED ORDER — IOHEXOL 300 MG/ML  SOLN
100.0000 mL | Freq: Once | INTRAMUSCULAR | Status: AC | PRN
  Administered 2020-03-15: 100 mL via INTRAVENOUS

## 2020-03-15 MED ORDER — PANTOPRAZOLE SODIUM 40 MG PO TBEC
40.0000 mg | DELAYED_RELEASE_TABLET | Freq: Every day | ORAL | Status: DC
  Administered 2020-03-16 – 2020-03-20 (×5): 40 mg via ORAL
  Filled 2020-03-15 (×5): qty 1

## 2020-03-15 MED ORDER — ALBUTEROL SULFATE (2.5 MG/3ML) 0.083% IN NEBU: 3.0000 mL | INHALATION_SOLUTION | RESPIRATORY_TRACT | Status: DC | PRN

## 2020-03-15 MED ORDER — ACETAMINOPHEN 325 MG PO TABS: 650.0000 mg | ORAL_TABLET | Freq: Four times a day (QID) | ORAL | Status: DC | PRN

## 2020-03-15 MED ORDER — INSULIN GLARGINE 100 UNIT/ML ~~LOC~~ SOLN
10.0000 [IU] | Freq: Every day | SUBCUTANEOUS | Status: DC
  Administered 2020-03-17 – 2020-03-20 (×4): 10 [IU] via SUBCUTANEOUS
  Filled 2020-03-15 (×5): qty 0.1

## 2020-03-15 MED ORDER — METRONIDAZOLE IN NACL 5-0.79 MG/ML-% IV SOLN
500.0000 mg | Freq: Three times a day (TID) | INTRAVENOUS | Status: DC
  Administered 2020-03-16 – 2020-03-18 (×7): 500 mg via INTRAVENOUS
  Filled 2020-03-15 (×7): qty 100

## 2020-03-15 MED ORDER — RENA-VITE PO TABS
1.0000 | ORAL_TABLET | Freq: Every day | ORAL | Status: DC
  Administered 2020-03-16 – 2020-03-19 (×4): 1 via ORAL
  Filled 2020-03-15 (×4): qty 1

## 2020-03-15 MED ORDER — FLUTICASONE FUROATE-VILANTEROL 200-25 MCG/INH IN AEPB
1.0000 | INHALATION_SPRAY | Freq: Every day | RESPIRATORY_TRACT | Status: DC
  Administered 2020-03-16 – 2020-03-20 (×4): 1 via RESPIRATORY_TRACT
  Filled 2020-03-15: qty 28

## 2020-03-15 MED ORDER — CIPROFLOXACIN IN D5W 400 MG/200ML IV SOLN
400.0000 mg | INTRAVENOUS | Status: DC
  Administered 2020-03-16 – 2020-03-17 (×3): 400 mg via INTRAVENOUS
  Filled 2020-03-15 (×3): qty 200

## 2020-03-15 NOTE — ED Notes (Signed)
Patient transported to CT 

## 2020-03-15 NOTE — ED Provider Notes (Signed)
[name] Provider Note   CSN: [id number] Arrival date & time: 03/15/20  1547     History Chief Complaint  Patient presents with  . Rectal Bleeding    [name] is a 54 y.o. male.  HPI He presents for evaluation of rectal bleeding, which started today, and has occurred at least twice.  He has some mild abdominal cramping which to him feels like post diagnosis discomfort.  Last dialyzed, yesterday.  History of GI bleeding, unknown source, required diagnosis by camera endoscopy.  Apparently it resolved spontaneously, and did not require intervention according to the patient's wife.  This was done in [place], [place], in 2017.  Patient was able to eat today, without difficulty.  He has not had any nausea, vomiting, fever, chills, chest pain, weakness or dizziness.  He presents for evaluation today, by private vehicle.  He typically walks using a walker.  He is not currently anticoagulated.  There are no other known modifying factors.    Past Medical History:  Diagnosis Date  . A-fib ([place])   . Anemia   . Asthma   . DM type 2 (diabetes mellitus, type 2) ([place]) 06/09/2019  . ESRD (end stage renal disease) on dialysis ([place])   . Essential hypertension 06/09/2019  . GIB (gastrointestinal bleeding)    Recurrent episodes- 09/2014, 09/2015 and 05/2016  . Gout   . History of recent blood transfusion 10/27/14   2 Units PRBC's  . Hyperkalemia 07/2011  . Multiple myeloma ([place])   . OSA on CPAP   . Pulmonary embolism ([place]) 07/2011; 09/27/2014   a. Bilat PE 07/2011 - unclear cause, tx with 6 months Coumadin.;   . Seizure disorder ([place]) 06/09/2019  . Sepsis ([place])   . Sickle cell-thalassemia disease ([place])    a. Sickle cell trait.  . Sleep apnea   . Stroke ([place]) 09/2015   R-MCA, L-MCA, PCA and bilateral cerebellar complicated by DVT/PE  . Subdural hematoma ([place]) 05/2019    Patient Active Problem List   Diagnosis Date Noted  . AMS (altered mental  status) 10/05/2019  . Acute metabolic encephalopathy 40/97/3532  . Headache, unspecified 09/28/2019  . Abnormality of gait 09/13/2019  . Diabetes mellitus with end-stage renal disease ([employer]) 08/07/2019  . Benign hypertensive kidney disease with chronic kidney disease 08/07/2019  . Hypothyroidism, unspecified 07/15/2019  . Spastic hemiparesis ([place]) 07/12/2019  . Dysphagia, oropharyngeal phase 07/05/2019  . Allergy, unspecified, initial encounter 06/27/2019  . PAF (paroxysmal atrial fibrillation) ([place])   . Bacteremia   . Labile blood pressure   . Labile blood glucose   . Controlled type 2 diabetes mellitus with hyperglycemia, without long-term current use of insulin ([place])   . ESRD on dialysis ([place])   . Right sided weakness   . SDH (subdural hematoma) ([place])   . Chronic intracranial subdural hematoma (HCC) 06/09/2019  . Multiple myeloma not having achieved remission ([place]) 06/09/2019  . ESRD on hemodialysis ([place]) 06/09/2019  . Essential hypertension 06/09/2019  . DM type 2 (diabetes mellitus, type 2) ([place]) 06/09/2019  . Seizure disorder ([place]) 06/09/2019  . Aspiration pneumonia ([place]) 06/09/2019  . History of bacteremia 06/09/2019  . Atrial fibrillation, chronic ([place]) 06/09/2019  . Bilateral kidney stones 05/31/2019  . Proteus infection 05/28/2019  . History of restrictive pulmonary disease 04/07/2019  . Multiple myeloma ([place]) 05/17/2018  . Goals of care, counseling/discussion 05/17/2018  . Partial idiopathic epilepsy with seizures of localized onset, intractable, without status epilepticus ([place]) 11/02/2017  .  Iron deficiency anemia, unspecified 01/22/2017  . COPD (chronic obstructive pulmonary disease) ([place]) 11/11/2016  . CVA (cerebral vascular accident) ([place]) 11/11/2016  . GERD (gastroesophageal reflux disease) 11/11/2016  . GI bleed 11/11/2016  . 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  . Obesity (BMI 30-39.9) 11/11/2016  . Pulmonary hypertension ([place]) 11/11/2016  . Patent  foramen ovale 11/11/2016  . Hyperparathyroidism, secondary renal ([place]) 11/11/2016  . VTE (venous thromboembolism) 11/11/2016  . Hypotension 11/11/2016  . Systemic hypertension 11/11/2016  . Seizure ([place]) 11/11/2016  . Other seizures ([place]) 07/25/2016  . Encounter for removal of sutures 05/27/2016  . Ingrown nail 03/03/2016  . Onychomycosis due to dermatophyte 03/03/2016  . Other acquired hammer toe 03/03/2016  . Hemiparesis affecting right side as late effect of cerebrovascular accident ([place]) 02/28/2016  . Aphasia complicating stroke 13/24/4010  . Seizures ([place]) 01/08/2016  . History of ischemic left MCA stroke 01/08/2016  . Right sided weakness 01/08/2016  . Atrial fibrillation ([place]) 01/08/2016  . Leukocytosis 01/08/2016  . Benign essential HTN 01/08/2016  . Anemia of chronic disease 01/08/2016  . Controlled type 2 diabetes mellitus with diabetic nephropathy, without long-term current use of insulin ([place]) 01/08/2016  . History of DVT (deep vein thrombosis) 01/08/2016  . 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 Hypocalcemia 12/26/2015  . Long term (current) use of anticoagulants 11/07/2015  . Aftercare including intermittent dialysis ([place]) 11/02/2015  . Other specified coagulation defects ([place]) 11/02/2015  . Complication of vascular dialysis catheter 11/02/2015  . Diarrhea, unspecified 11/02/2015  . Fever, unspecified 11/02/2015  . Pruritus, unspecified 11/02/2015  . Shortness of breath 11/02/2015  . Type 2 diabetes mellitus with diabetic peripheral angiopathy without gangrene ([place]) 11/02/2015  . ESRD (end stage renal disease) on dialysis [name])     Past Surgical History:  Procedure Laterality Date  . AV FISTULA PLACEMENT Right 12/05/2015   Procedure: INSERTION OF ARTERIOVENOUS (AV) GORE-TEX GRAFT ARM;  Surgeon: Elam Dutch, MD;  Location: [facility];  Service: Vascular;  Laterality: Right;  . BONE MARROW BIOPSY    . CATARACT EXTRACTION  08/2019  . CATARACT EXTRACTION  09/2019   . CHOLECYSTECTOMY    . CHOLECYSTECTOMY  1990's?  . COLONOSCOPY WITH PROPOFOL N/A 01/22/2017   Procedure: COLONOSCOPY WITH PROPOFOL;  Surgeon: Wilford Corner, MD;  Location: [facility];  Service: Endoscopy;  Laterality: N/A;  . EYE SURGERY Right   . INSERTION OF DIALYSIS CATHETER Right 10/16/2015   Procedure: INSERTION OF PALINDROME DIALYSIS CATHETER ;  Surgeon: Elam Dutch, MD;  Location: [facility];  Service: Vascular;  Laterality: Right;  . OTHER SURGICAL HISTORY     Retinal surgery  . PARS PLANA VITRECTOMY  02/17/2012   Procedure: PARS PLANA VITRECTOMY WITH 25 GAUGE;  Surgeon: Hayden Pedro, MD;  Location: [facility];  Service: Ophthalmology;  Laterality: Right;  . PERIPHERAL VASCULAR CATHETERIZATION N/A 03/20/2016   Procedure: A/V Shuntogram/Fistulagram;  Surgeon: Conrad Clarissa, MD;  Location: [facility] CV LAB;  Service: Cardiovascular;  Laterality: N/A;  . TEE WITHOUT CARDIOVERSION N/A 12/02/2016   Procedure: TRANSESOPHAGEAL ECHOCARDIOGRAM (TEE);  Surgeon: Larey Dresser, MD;  Location[facility] ENDOSCOPY;  Service: Cardiovascular;  Laterality: N/A;       Family History  Problem Relation Age of Onset  . Hypertension Mother   . Hypertension Father   . Stroke Father   . Sickle cell anemia Brother   . Diabetes Mother   . Sickle cell anemia Brother   . Diabetes type I Brother   . Kidney disease  Brother     Social History   Tobacco Use  . Smoking status: Never Smoker  . Smokeless tobacco: Never Used  Vaping Use  . Vaping Use: Never used  Substance Use Topics  . Alcohol use: Not Currently  . Drug use: Never    Home Medications Prior to Admission medications   Medication Sig Start Date End Date Taking? Authorizing Provider  acetaminophen (TYLENOL) 325 MG tablet Take 1-2 tablets (325-650 mg total) by mouth every 4 (four) hours as needed for mild pain. 06/20/19  Yes Love, Ivan Anchors, PA-C  acyclovir (ZOVIRAX) 400 MG tablet TAKE 1 TABLET BY MOUTH ONCE DAILY Patient taking  differently: Take 400 mg by mouth daily.  02/07/20  Yes [name], Mathis Dad, MD  AURYXIA 1 GM 210 MG(Fe) tablet Take 210 mg by mouth 3 (three) times daily. 11/30/19  Yes [provider]  BREO ELLIPTA 200-25 MCG/INH AEPB INHALE 1 PUFF BY MOUTH AT [place] Patient taking differently: Inhale 1 puff into the lungs daily.  01/16/20  Yes [name], MD  camphor-menthol [facility]) lotion Apply 1 application topically every 8 (eight) hours as needed for itching. 06/30/19  Yes Love, Ivan Anchors, PA-C  Cetirizine HCl 10 MG CAPS Take 1 capsule by mouth daily.   Yes [provider]  clonazePAM [facility] [place]) 0.5 MG tablet Take 2 tablets every Monday, Wednesday, and Friday with hemodialysis Patient taking differently: Take 1 mg by mouth every Monday, Wednesday, and Friday. Before dialysis 01/12/20  Yes Cameron Sprang, MD  dexamethasone (DECADRON) 4 MG tablet Take 5 tablets (20 mg total) by mouth once a week. Take 5 tablets (20 mg total) by mouth once a week on day of chemo. Patient taking differently: Take 20 mg by mouth every 30 (thirty) days. On the chemo days. 09/06/19  Yes Wyatt Portela, MD  hydrOXYzine (ATARAX/VISTARIL) 25 MG tablet Take 1 tablet (25 mg total) by mouth every 8 (eight) hours as needed for itching. 06/30/19  Yes Love, Ivan Anchors, PA-C  Insulin Glargine (BASAGLAR KWIKPEN) 100 UNIT/ML Inject 10 Units into the skin daily.  12/08/19  Yes [provider]  Insulin Lispro (HUMALOG KWIKPEN [place]) Inject 2-4 Units into the skin 3 (three) times daily before meals. Sliding scale: 150-250 =2 units, 250 -350=3 units, 350 += 4 units   Yes [provider]  lacosamide (VIMPAT) 200 MG TABS tablet Take 1 tablet twice a day except on dialysis days M-W-F give 1 tab in AM, 1 tab after dialysis, 1 tab in PM. Patient taking differently: Take 200 mg by mouth See admin instructions. Take 1 tablet twice a day except on dialysis days M-W-F give 1 tab in AM, 1 tab after dialysis, 1 tab in PM.  01/12/20  Yes Cameron Sprang, MD  lamoTRIgine (LAMICTAL) 200 MG tablet Take 1 tablet (200 mg total) by mouth 2 (two) times daily. 01/12/20  Yes Cameron Sprang, MD  midodrine (PROAMATINE) 5 MG tablet Take one tablet by mouth 30 minutes before dialysis. Patient taking differently: Take 5 mg by mouth See admin instructions. Take one tablet by mouth on Monday Wednesday and Friday 30 minutes before dialysis. 10/31/19  Yes [name], MD  multivitamin (RENA-VIT) TABS tablet Take 1 tablet by mouth at bedtime.   Yes [provider]  Nutritional Supplements (FEEDING SUPPLEMENT, NEPRO CARB STEADY,) LIQD Take 237 mLs by mouth 3 (three) times daily as needed (Supplement). 10/07/19  Yes Blain Pais, MD  pantoprazole (PROTONIX) 40 MG  tablet TAKE 1 TABLET BY MOUTH ONCE DAILY Patient taking differently: Take 40 mg by mouth daily.  11/23/19  Yes [name], MD  prochlorperazine (COMPAZINE) 10 MG tablet TAKE 1 TABLET (10 MG TOTAL) BY MOUTH EVERY 6 (SIX) HOURS AS NEEDED FOR NAUSEA OR VOMITING. 11/15/18  Yes Wyatt Portela, MD  albuterol (PROAIR HFA) 108 (90 Base) MCG/ACT inhaler Inhale two puffs every 4-6 hours if needed for cough or wheeze Patient taking differently: Inhale 2 puffs into the lungs every 4 (four) hours as needed for wheezing or shortness of breath. Inhale two puffs every 4-6 hours if needed for cough or wheeze 01/26/20   [name], MD  Alcohol Swabs (SM ALCOHOL PREP) 70 % PADS  07/19/19   [provider]  FREESTYLE LITE test strip  07/21/19   [provider]  Lancets (FREESTYLE) lancets  07/21/19   [provider]  UNIFINE PENTIPS 31G X 8 MM MISC USE AS DIRECTED DAILY AFTER SUPPER 12/10/19   [name], MD    Allergies    Codeine and Codeine  Review of Systems   Review of Systems  All other systems reviewed and are negative.   Physical Exam Updated Vital Signs BP 119/61 (BP Location: Right Arm)   Pulse 86   Temp 99.3 F (37.4 C) (Oral)    Resp 15   Ht 5' 10"  (1.778 m)   Wt 80.3 kg   SpO2 100%   BMI 25.40 kg/m   Physical Exam Vitals and nursing note reviewed.  Constitutional:      Appearance: He is well-developed.  HENT:     Head: Normocephalic and atraumatic.     Right Ear: External ear normal.     Left Ear: External ear normal.  Eyes:     Conjunctiva/sclera: Conjunctivae normal.     Pupils: Pupils are equal, round, and reactive to light.  Neck:     Trachea: Phonation normal.  Cardiovascular:     Rate and Rhythm: Normal rate and regular rhythm.     Heart sounds: Normal heart sounds.  Pulmonary:     Effort: Pulmonary effort is normal.     Breath sounds: Normal breath sounds.  Abdominal:     Palpations: Abdomen is soft.     Tenderness: There is no abdominal tenderness.  Genitourinary:    Comments: Blood on perianal area.  Blood in rectum.  No rectal mass.  No stool in rectum. Musculoskeletal:        General: Normal range of motion.     Cervical back: Normal range of motion and neck supple.  Skin:    General: Skin is warm and dry.  Neurological:     Mental Status: He is alert and oriented to person, place, and time.     Cranial Nerves: No cranial nerve deficit.     Sensory: No sensory deficit.     Motor: No abnormal muscle tone.     Coordination: Coordination normal.  Psychiatric:        Behavior: Behavior normal.        Thought Content: Thought content normal.        Judgment: Judgment normal.     ED Results / Procedures / Treatments   Labs (all labs ordered are listed, but only abnormal results are displayed) Labs Reviewed  COMPREHENSIVE METABOLIC PANEL - Abnormal; Notable for the following components:      Result Value   Chloride 92 (*)    Glucose, Bld 237 (*)    BUN  42 (*)    Creatinine, Ser 8.68 (*)    AST 52 (*)    Alkaline Phosphatase 129 (*)    Total Bilirubin 1.5 (*)    GFR calc non Af Amer 6 (*)    GFR calc Af Amer 7 (*)    Anion gap 17 (*)    All other components within normal  limits  CBC - Abnormal; Notable for the following components:   WBC 15.8 (*)    RBC 3.71 (*)    Hemoglobin 12.3 (*)    HCT 33.7 (*)    MCHC 36.5 (*)    RDW 17.1 (*)    nRBC 8.8 (*)    All other components within normal limits  POC OCCULT BLOOD, ED - Abnormal; Notable for the following components:   Fecal Occult Bld POSITIVE (*)    All other components within normal limits  SARS CORONAVIRUS 2 BY RT PCR (HOSPITAL ORDER, [facility])  LIPASE, BLOOD  PATHOLOGIST SMEAR REVIEW  SAMPLE TO BLOOD BANK    EKG None  Radiology CT Abdomen Pelvis W Contrast  Result Date: 03/15/2020 CLINICAL DATA:  Rectal bleeding EXAM: CT ABDOMEN AND PELVIS WITH CONTRAST TECHNIQUE: Multidetector CT imaging of the abdomen and pelvis was performed using the standard protocol following bolus administration of intravenous contrast. CONTRAST:  119m OMNIPAQUE IOHEXOL 300 MG/ML  SOLN COMPARISON:  CT 08/09/2019 FINDINGS: Lower chest: Atelectatic changes noted in the lung bases. Normal heart size. No pericardial effusion. Small subcutaneous nodule along the right anterior chest wall measuring up to 1.6 cm, unchanged from prior and likely reflective of small dermal inclusion cyst. Some posterior chest wall edematous changes and skin thickening are noted. Hepatobiliary: No focal liver abnormality is seen. Patient is post cholecystectomy. Slight prominence of the biliary tree likely related to reservoir effect. No calcified intraductal gallstones. Pancreas: No visible or concerning pancreatic lesion. Mild pancreatic atrophy particularly towards the head neck junction. No peripancreatic inflammation or ductal dilatation. Spleen: Diminutive spleen with peripheral calcifications similar to the comparison exam. No new splenic lesions. Adrenals/Urinary Tract: Mild thickening of the adrenal glands without discrete nodule. Bilateral renal atrophy and vascular calcifications with chronic mild bilateral perinephric  stranding and symmetrically delayed nephrograms and excretion. Multiple bilateral fluid attenuation cysts are present. No obstructive urolithiasis or hydronephrosis is seen. Urinary bladder is decompressed at the time of imaging with some marked mural thickening and intramural fatty infiltration as well as mild perivesicular hazy stranding. Stomach/Bowel: Distal esophagus, stomach and duodenal sweep are unremarkable. No small bowel wall thickening or dilatation. No evidence of obstruction. Appendix is fluid-filled but normal caliber without focal periappendiceal stranding or inflammation to suggest acute appendicitis. Moderate proximal colonic stool burden without wall thickening or dilatation. Edematous thickening of the distal colon is seen from the splenic flexure through the descending and rectosigmoid colon. Surrounding pericolonic stranding is present. Much of the distal colon is decompressed or air-filled with a small amount of liquid material within the rectal vault. No pneumatosis or air within the draining mesentery. No evidence of perforation or abscess. Vascular/Lymphatic: Atherosclerotic calcifications seen throughout the abdominal aorta and branch vessels including fairly significant plaque at the IMA origin which may result in at least some moderate to high-grade stenosis incompletely assessed on a non angiographic exam. No aneurysm or ectasia. Reproductive: The prostate and seminal vesicles are unremarkable. Other: Asymmetric atrophy of the right rectus sheath and postsurgical changes of the right upper quadrant mild abdominal wall laxity. No bowel containing  hernia. No abdominopelvic free air or fluid. Musculoskeletal: Multilevel discogenic and facet degenerative changes throughout the spine. Multilevel flowing anterior osteophytosis of the lower thoracic spine, compatible with features of diffuse idiopathic skeletal hyperostosis (DISH) though there is ankylosis of the bilateral SI joints.  Additional degenerative changes in the hips. No acute osseous abnormality or suspicious osseous lesion. IMPRESSION: 1. Edematous thickening of the distal colon from the splenic flexure through the descending and rectosigmoid colon with surrounding pericolonic stranding, consistent with colitis, which may be infectious, inflammatory or vascular in etiology particularly given some extensive plaque at the IMA origin and the distal colonic distribution. No evidence of perforation or abscess. 2. Urinary bladder is decompressed however there is marked mural thickening and mildperivesicular hazy stranding. Bilateral renal atrophy and vascular calcifications with chronic mild bilateral perinephric stranding and symmetrically delayed nephrograms and excretion. Can be seen in the setting of chronic renal disease or infection. Correlate with urinalysis to exclude cystitis and ascending tract infection. 3. Aortic Atherosclerosis (ICD10-I70.0). Electronically Signed   By: Lovena Le M.D.   On: 03/15/2020 21:56    Procedures Procedures (including critical care time)  Medications Ordered in ED Medications  sodium chloride flush (NS) 0.9 % injection 3 mL (3 mLs Intravenous Not Given 03/15/20 1923)  metroNIDAZOLE (FLAGYL) IVPB 500 mg (has no administration in time range)  iohexol (OMNIPAQUE) 300 MG/ML solution 100 mL (100 mLs Intravenous Contrast Given 03/15/20 2141)    ED Course  I have reviewed the triage vital signs and the nursing notes.  Pertinent labs & imaging results that were available during my care of the patient were reviewed by me and considered in my medical decision making (see chart for details).  Clinical Course as of Mar 15 2226  Thu Mar 15, 2020  2002 Discussed with nephrology, Dr. Posey Pronto, he is okay with proceeding with IV contrast CT imaging.   [EW]  2216 Normal  SARS Coronavirus 2 by RT PCR (hospital order, performed in [facility] hospital lab) Nasopharyngeal Nasopharyngeal Swab [EW]  2216  Abnormal, blood present  POC occult blood, ED(!) [EW]  2216 Normal except white count high, hemoglobin low  CBC(!) [EW]  2217 Normal except chloride low, glucose high, BUN high, creatinine high, AST high, alk phosphatase high, total bilirubin high, GFR low, anion gap high  Comprehensive metabolic panel(!) [EW]  1779 Normal   [EW]  2218 Per radiologist, abnormal findings consistent with nonspecific colitis, and possibly urinary tract disease however those findings may be from chronic renal disease.  CT Abdomen Pelvis W Contrast [EW]  2222 Alkaline Phosphatase(!): 129 [EW]    Clinical Course User Index [EW] Daleen Bo, MD   MDM Rules/Calculators/A&P                           Patient Vitals for the past 24 hrs:  BP Temp Temp src Pulse Resp SpO2 Height Weight  03/15/20 2200 119/61 - - 86 15 100 % - -  03/15/20 2030 115/60 - - 81 15 98 % - -  03/15/20 1945 104/70 - - 87 16 98 % - -  03/15/20 1930 102/67 - - 90 17 95 % - -  03/15/20 1832 (!) 78/32 - - 85 16 100 % - -  03/15/20 1619 - - - - - - 5' 10"  (1.778 m) 80.3 kg  03/15/20 1615 - - - - - - 5' 10"  (1.778 m) 80.3 kg  03/15/20 1613 (!) 110/51 99.3 F (  37.4 C) Oral 95 18 99 % - -    10:21 PM Reevaluation with update and discussion. After initial assessment and treatment, an updated evaluation reveals no change in status, he remains fairly comfortable.  Findings discussed with patient and wife, all questions answered Daleen Bo   Medical Decision Making:  This patient is presenting for evaluation of rectal bleeding, which does require a range of treatment options, and is a complaint that involves a high risk of morbidity and mortality. The differential diagnoses include bleeding from infection, bleeding from AVM, cancer. I decided to review old records, and in summary frail middle-aged man with end-stage renal disease presenting with recurrent rectal bleeding.  I obtained additional historical information from his wife at the  bedside.  Clinical Laboratory Tests Ordered, included fecal occult blood, CBC, Metabolic panel and Lipase. Review indicates elevated white count, hemoglobin slightly low, elevated glucose, elevated BUN, elevated creatinine, elevated bilirubin. Radiologic Tests Ordered, included CT abdomen pelvis.  I independently Visualized: Radiographic images, which show colitis, nonspecific    Critical Interventions-clinical evaluation, laboratory testing, CT imaging, observation, empiric antibiotic treatment, reevaluation  After These Interventions, the Patient was reevaluated and was found comfortable but requiring admission for treatment of colitis.  Rectal bleeding, has not caused hemodynamic instability or significant anemia at this time.  He requires close monitoring and probably GI consultation.  CRITICAL CARE-no Performed by: Daleen Bo Nursing Notes Reviewed/ Care Coordinated Applicable Imaging Reviewed Interpretation of Laboratory Data incorporated into ED treatment  10:07 PM-Consult complete with hospitalist. Patient case explained and discussed.  He agrees to admit patient for further evaluation and treatment. Call ended at 10:30 PM  Plan: Admit    Final Clinical Impression(s) / ED Diagnoses Final diagnoses:  Rectal bleeding  Colitis    Rx / DC Orders ED Discharge Orders    None       Daleen Bo, MD 03/15/20 2317

## 2020-03-15 NOTE — Progress Notes (Signed)
Report received. Room is ready.

## 2020-03-15 NOTE — ED Triage Notes (Signed)
Pt having bright red bloody stool x 2 today.

## 2020-03-15 NOTE — H&P (Signed)
History and Physical    Isaiah [name] [facility] UMP:[id number] DOB: [birth year] DOA: 03/15/2020  PCP: Isaiah Chard, MD  Patient coming from: Home  I have personally briefly reviewed patient's old medical records in [place]  Chief Complaint: BRBPR  HPI: Isaiah [name] is a 54 y.o. male with medical history significant for ESRD on MWF HD, multiple myeloma (currently on Velcade & daratumumab), history of subdural hematoma (with residual right hemiparesis, dysarthria), history of DVT/PE s/p IVC filter, history of CVA, paroxysmal atrial fibrillation not on anticoagulation, insulin-dependent type 2 diabetes, COPD, anemia of chronic disease, seizure disorder, and OSA not requiring CPAP who presents to the ED for evaluation of BRBPR.  History is supplemented by patient's wife via telephone conversation.  Patient went to his usual dialysis session on 03/14/2020.  Afterwards he had some mild abdominal cramping sensation and 3 episodes of diarrhea.  He states he developed new onset bright red blood per rectum earlier this morning shortly after midnight (03/15/2020).  He has had associated mild right-sided abdominal discomfort.  He reports significant amount of blood per rectum, initially bright red now appearing dark red.  He did not have associated rectal pain, lightheadedness/dizziness, subjective fevers, chills, diaphoresis, chest pain, palpitations, dyspnea, cough, nausea, vomiting.  He has not seen any other obvious bleeding.  He says he is not on any blood thinners.  He states he only makes a small amount of urine.  He has had some loose stools.  He reports a similar episode in 2017 when he was in [place], [place] visiting his mother.  Wife states that he had a capsule endoscopy which showed a small intestinal bleed/tear which was repaired.  He did not have any further issues since then.  He did have a repeat colonoscopy here in 2018 by Isaiah [name] for evaluation of iron deficiency anemia which showed internal  hemorrhoids and several polyps which were resected.  ED Course:  Initial vitals showed BP 110/51, pulse 95, RR 18, temp 99.3 Fahrenheit, SPO2 99% on room air.  Labs are notable for hemoglobin 12.3, WBC 15.8, platelets 283,000, sodium 138, potassium 4.6, bicarb 29, BUN 42, creatinine 8.68, serum glucose 237, AST 52, ALT 28, alk phos 129, total bilirubin 1.5, lipase 35.  FOBT is positive.  SARS-CoV-2 PCR is negative.  CT abdomen/pelvis with contrast shows edematous thickening of the distal colon from the splenic flexure through the descending and rectosigmoid colon with surrounding pericolonic stranding consistent with colitis.  Question infectious, inflammatory, or vascular etiology given extensive plaque at the IMA origin.  Bilateral renal atrophy and vascular calcifications with chronic mild bilateral perinephric stranding also seen.  Patient was given IV ciprofloxacin and Flagyl.  The hospitalist service was consulted for further evaluation and management.  Review of Systems: All systems reviewed and are negative except as documented in history of present illness above.   Past Medical History:  Diagnosis Date  . A-fib ([place])   . Anemia   . Asthma   . DM type 2 (diabetes mellitus, type 2) ([place]) 06/09/2019  . ESRD (end stage renal disease) on dialysis ([place])   . Essential hypertension 06/09/2019  . GIB (gastrointestinal bleeding)    Recurrent episodes- 09/2014, 09/2015 and 05/2016  . Gout   . History of recent blood transfusion 10/27/14   2 Units PRBC's  . Hyperkalemia 07/2011  . Multiple myeloma ([place])   . OSA on CPAP   . Pulmonary embolism ([place]) 07/2011; 09/27/2014   a. Bilat PE 07/2011 - unclear cause,  tx with 6 months Coumadin.;   . Seizure disorder ([place]) 06/09/2019  . Sepsis ([place])   . Sickle cell-thalassemia disease ([place])    a. Sickle cell trait.  . Sleep apnea   . Stroke ([place]) 09/2015   R-MCA, L-MCA, PCA and bilateral cerebellar complicated by DVT/PE  . Subdural hematoma ([place])  05/2019    Past Surgical History:  Procedure Laterality Date  . AV FISTULA PLACEMENT Right 12/05/2015   Procedure: INSERTION OF ARTERIOVENOUS (AV) GORE-TEX GRAFT ARM;  Surgeon: Elam Dutch, MD;  Location: [place];  Service: Vascular;  Laterality: Right;  . BONE MARROW BIOPSY    . CATARACT EXTRACTION  08/2019  . CATARACT EXTRACTION  09/2019  . CHOLECYSTECTOMY    . CHOLECYSTECTOMY  1990's?  . COLONOSCOPY WITH PROPOFOL N/A 01/22/2017   Procedure: COLONOSCOPY WITH PROPOFOL;  Surgeon: Wilford Corner, MD;  Location: [facility];  Service: Endoscopy;  Laterality: N/A;  . EYE SURGERY Right   . INSERTION OF DIALYSIS CATHETER Right 10/16/2015   Procedure: INSERTION OF PALINDROME DIALYSIS CATHETER ;  Surgeon: Elam Dutch, MD;  Location: [place];  Service: Vascular;  Laterality: Right;  . OTHER SURGICAL HISTORY     Retinal surgery  . PARS PLANA VITRECTOMY  02/17/2012   Procedure: PARS PLANA VITRECTOMY WITH 25 GAUGE;  Surgeon: Hayden Pedro, MD;  Location: [place];  Service: Ophthalmology;  Laterality: Right;  . PERIPHERAL VASCULAR CATHETERIZATION N/A 03/20/2016   Procedure: A/V Shuntogram/Fistulagram;  Surgeon: Conrad Hobucken, MD;  Location: [place] [facility];  Service: Cardiovascular;  Laterality: N/A;  . TEE WITHOUT CARDIOVERSION N/A 12/02/2016   Procedure: TRANSESOPHAGEAL ECHOCARDIOGRAM (TEE);  Surgeon: Larey Dresser, MD;  Location: [place];  Service: Cardiovascular;  Laterality: N/A;    Social History:  reports that he has never smoked. He has never used smokeless tobacco. He reports previous alcohol use. He reports that he does not use drugs.  Allergies  Allergen Reactions  . Codeine Swelling  . Codeine Rash and Other (See Comments)    Unknown reaction (patient says it was more serious than just a rash, but he can't remember what happened)     Family History  Problem Relation Age of Onset  . Hypertension Mother   . Hypertension Father   . Stroke Father   . Sickle cell  anemia Brother   . Diabetes Mother   . Sickle cell anemia Brother   . Diabetes type I Brother   . Kidney disease Brother      Prior to Admission medications   Medication Sig Start Date End Date Taking? Authorizing Provider  acetaminophen (TYLENOL) 325 MG tablet Take 1-2 tablets (325-650 mg total) by mouth every 4 (four) hours as needed for mild pain. 06/20/19  Yes Love, Ivan Anchors, PA-C  acyclovir (ZOVIRAX) 400 MG tablet TAKE 1 TABLET BY MOUTH ONCE DAILY Patient taking differently: Take 400 mg by mouth daily.  02/07/20  Yes [name], Mathis Dad, MD  AURYXIA 1 GM 210 MG(Fe) tablet Take 210 mg by mouth 3 (three) times daily. 11/30/19  Yes [provider]  BREO ELLIPTA 200-25 MCG/INH AEPB INHALE 1 PUFF BY MOUTH AT [place] Patient taking differently: Inhale 1 puff into the lungs daily.  01/16/20  Yes Isaiah Chard, MD  camphor-menthol [facility]) lotion Apply 1 application topically every 8 (eight) hours as needed for itching. 06/30/19  Yes Love, Ivan Anchors, PA-C  Cetirizine HCl 10 MG CAPS Take 1 capsule by mouth daily.  Yes [provider]  clonazePAM [facility] [place]) 0.5 MG tablet Take 2 tablets every Monday, Wednesday, and Friday with hemodialysis Patient taking differently: Take 1 mg by mouth every Monday, Wednesday, and Friday. Before dialysis 01/12/20  Yes Cameron Sprang, MD  dexamethasone (DECADRON) 4 MG tablet Take 5 tablets (20 mg total) by mouth once a week. Take 5 tablets (20 mg total) by mouth once a week on day of chemo. Patient taking differently: Take 20 mg by mouth every 30 (thirty) days. On the chemo days. 09/06/19  Yes Wyatt Portela, MD  hydrOXYzine (ATARAX/VISTARIL) 25 MG tablet Take 1 tablet (25 mg total) by mouth every 8 (eight) hours as needed for itching. 06/30/19  Yes Love, Ivan Anchors, PA-C  Insulin Glargine (BASAGLAR KWIKPEN) 100 UNIT/ML Inject 10 Units into the skin daily.  12/08/19  Yes [provider]  Insulin Lispro (HUMALOG KWIKPEN [place]) Inject 2-4  Units into the skin 3 (three) times daily before meals. Sliding scale: 150-250 =2 units, 250 -350=3 units, 350 += 4 units   Yes [provider]  lacosamide (VIMPAT) 200 MG TABS tablet Take 1 tablet twice a day except on dialysis days M-W-F give 1 tab in AM, 1 tab after dialysis, 1 tab in PM. Patient taking differently: Take 200 mg by mouth See admin instructions. Take 1 tablet twice a day except on dialysis days M-W-F give 1 tab in AM, 1 tab after dialysis, 1 tab in PM. 01/12/20  Yes Cameron Sprang, MD  lamoTRIgine (LAMICTAL) 200 MG tablet Take 1 tablet (200 mg total) by mouth 2 (two) times daily. 01/12/20  Yes Cameron Sprang, MD  midodrine (PROAMATINE) 5 MG tablet Take one tablet by mouth 30 minutes before dialysis. Patient taking differently: Take 5 mg by mouth See admin instructions. Take one tablet by mouth on Monday Wednesday and Friday 30 minutes before dialysis. 10/31/19  Yes Isaiah Chard, MD  multivitamin (RENA-VIT) TABS tablet Take 1 tablet by mouth at bedtime.   Yes [provider]  Nutritional Supplements (FEEDING SUPPLEMENT, NEPRO CARB STEADY,) LIQD Take 237 mLs by mouth 3 (three) times daily as needed (Supplement). 10/07/19  Yes Adele Barthel D, MD  pantoprazole (PROTONIX) 40 MG tablet TAKE 1 TABLET BY MOUTH ONCE DAILY Patient taking differently: Take 40 mg by mouth daily.  11/23/19  Yes Isaiah Chard, MD  prochlorperazine (COMPAZINE) 10 MG tablet TAKE 1 TABLET (10 MG TOTAL) BY MOUTH EVERY 6 (SIX) HOURS AS NEEDED FOR NAUSEA OR VOMITING. 11/15/18  Yes Wyatt Portela, MD  albuterol (PROAIR HFA) 108 (90 Base) MCG/ACT inhaler Inhale two puffs every 4-6 hours if needed for cough or wheeze Patient taking differently: Inhale 2 puffs into the lungs every 4 (four) hours as needed for wheezing or shortness of breath. Inhale two puffs every 4-6 hours if needed for cough or wheeze 01/26/20   Isaiah Chard, MD  Alcohol Swabs (SM ALCOHOL PREP) 70 % PADS  07/19/19   [provider]  FREESTYLE LITE test strip  07/21/19   [provider]  Lancets (FREESTYLE) lancets  07/21/19   [provider]  UNIFINE PENTIPS 31G X 8 MM MISC USE AS DIRECTED DAILY AFTER SUPPER 12/10/19   Isaiah Chard, MD    Physical Exam: Vitals:   03/15/20 1945 03/15/20 2030 03/15/20 2200 03/15/20 2230  BP: 104/70 115/60 119/61 128/81  Pulse: 87 81 86 92  Resp: _0 Temp:      TempSrc:  SpO2: 98% 98% 100% 96%  Weight:      Height:       Constitutional: Resting supine in bed, NAD, calm, comfortable Eyes: PERRL, lids and conjunctivae normal ENMT: Mucous membranes are moist. Posterior pharynx clear of any exudate or lesions.Normal dentition.  Neck: normal, supple, no masses. Respiratory: clear to auscultation bilaterally, no wheezing, no crackles. Normal respiratory effort. No accessory muscle use.  Cardiovascular: Regular rate and rhythm, no murmurs / rubs / gallops. No extremity edema. 2+ pedal pulses.  RUE aVF with palpable thrill. Abdomen: Mild epigastric tenderness, no masses palpated. No hepatosplenomegaly. Bowel sounds positive.  Musculoskeletal: no clubbing / cyanosis. No joint deformity upper and lower extremities. Good ROM, no contractures. Normal muscle tone.  Skin: no rashes, lesions, ulcers. No induration Neurologic: CN 2-12 grossly intact. Sensation intact, Strength 5/5 in all 4.  Psychiatric: Normal judgment and insight. Alert and oriented x 3. Normal mood.   Labs on Admission: I have personally reviewed following labs and imaging studies  CBC: Recent Labs  Lab 03/15/20 1631  WBC 15.8*  HGB 12.3*  HCT 33.7*  MCV 90.8  PLT 563   Basic Metabolic Panel: Recent Labs  Lab 03/15/20 1631  NA 138  K 4.6  CL 92*  CO2 29  GLUCOSE 237*  BUN 42*  CREATININE 8.68*  CALCIUM 9.2   GFR: Estimated Creatinine Clearance: 9.8 mL/min (A) (by C-G formula based on SCr of 8.68 mg/dL (H)). Liver Function Tests: Recent Labs  Lab  03/15/20 1631  AST 52*  ALT 28  ALKPHOS 129*  BILITOT 1.5*  PROT 7.1  ALBUMIN 4.1   Recent Labs  Lab 03/15/20 1631  LIPASE 35   No results for input(s): AMMONIA in the last 168 hours. Coagulation Profile: No results for input(s): INR, PROTIME in the last 168 hours. Cardiac Enzymes: No results for input(s): CKTOTAL, CKMB, CKMBINDEX, TROPONINI in the last 168 hours. BNP (last 3 results) No results for input(s): PROBNP in the last 8760 hours. HbA1C: No results for input(s): HGBA1C in the last 72 hours. CBG: No results for input(s): GLUCAP in the last 168 hours. Lipid Profile: No results for input(s): CHOL, HDL, LDLCALC, TRIG, CHOLHDL, LDLDIRECT in the last 72 hours. Thyroid Function Tests: No results for input(s): TSH, T4TOTAL, FREET4, T3FREE, THYROIDAB in the last 72 hours. Anemia Panel: No results for input(s): VITAMINB12, FOLATE, FERRITIN, TIBC, IRON, RETICCTPCT in the last 72 hours. Urine analysis:    Component Value Date/Time   COLORURINE YELLOW 12/03/2015 [employer] 12/03/2015 1746   LABSPEC 1.009 12/03/2015 1746   PHURINE 5.5 12/03/2015 1746   GLUCOSEU 100 (A) 12/03/2015 1746   HGBUR NEGATIVE 12/03/2015 1746   BILIRUBINUR NEGATIVE 12/03/2015 1746   KETONESUR NEGATIVE 12/03/2015 1746   PROTEINUR 100 (A) 12/03/2015 1746   UROBILINOGEN 0.2 10/01/2014 1124   NITRITE NEGATIVE 12/03/2015 1746   LEUKOCYTESUR NEGATIVE 12/03/2015 1746    Radiological Exams on Admission: CT Abdomen Pelvis W Contrast  Result Date: 03/15/2020 CLINICAL DATA:  Rectal bleeding EXAM: CT ABDOMEN AND PELVIS WITH CONTRAST TECHNIQUE: Multidetector CT imaging of the abdomen and pelvis was performed using the standard protocol following bolus administration of intravenous contrast. CONTRAST:  165m OMNIPAQUE IOHEXOL 300 MG/ML  SOLN COMPARISON:  CT 08/09/2019 FINDINGS: Lower chest: Atelectatic changes noted in the lung bases. Normal heart size. No pericardial effusion. Small subcutaneous  nodule along the right anterior chest wall measuring up to 1.6 cm, unchanged from prior and likely reflective of small dermal inclusion  cyst. Some posterior chest wall edematous changes and skin thickening are noted. Hepatobiliary: No focal liver abnormality is seen. Patient is post cholecystectomy. Slight prominence of the biliary tree likely related to reservoir effect. No calcified intraductal gallstones. Pancreas: No visible or concerning pancreatic lesion. Mild pancreatic atrophy particularly towards the head neck junction. No peripancreatic inflammation or ductal dilatation. Spleen: Diminutive spleen with peripheral calcifications similar to the comparison exam. No new splenic lesions. Adrenals/Urinary Tract: Mild thickening of the adrenal glands without discrete nodule. Bilateral renal atrophy and vascular calcifications with chronic mild bilateral perinephric stranding and symmetrically delayed nephrograms and excretion. Multiple bilateral fluid attenuation cysts are present. No obstructive urolithiasis or hydronephrosis is seen. Urinary bladder is decompressed at the time of imaging with some marked mural thickening and intramural fatty infiltration as well as mild perivesicular hazy stranding. Stomach/Bowel: Distal esophagus, stomach and duodenal sweep are unremarkable. No small bowel wall thickening or dilatation. No evidence of obstruction. Appendix is fluid-filled but normal caliber without focal periappendiceal stranding or inflammation to suggest acute appendicitis. Moderate proximal colonic stool burden without wall thickening or dilatation. Edematous thickening of the distal colon is seen from the splenic flexure through the descending and rectosigmoid colon. Surrounding pericolonic stranding is present. Much of the distal colon is decompressed or air-filled with a small amount of liquid material within the rectal vault. No pneumatosis or air within the draining mesentery. No evidence of perforation  or abscess. Vascular/Lymphatic: Atherosclerotic calcifications seen throughout the abdominal aorta and branch vessels including fairly significant plaque at the IMA origin which may result in at least some moderate to high-grade stenosis incompletely assessed on a non angiographic exam. No aneurysm or ectasia. Reproductive: The prostate and seminal vesicles are unremarkable. Other: Asymmetric atrophy of the right rectus sheath and postsurgical changes of the right upper quadrant mild abdominal wall laxity. No bowel containing hernia. No abdominopelvic free air or fluid. Musculoskeletal: Multilevel discogenic and facet degenerative changes throughout the spine. Multilevel flowing anterior osteophytosis of the lower thoracic spine, compatible with features of diffuse idiopathic skeletal hyperostosis (DISH) though there is ankylosis of the bilateral SI joints. Additional degenerative changes in the hips. No acute osseous abnormality or suspicious osseous lesion. IMPRESSION: 1. Edematous thickening of the distal colon from the splenic flexure through the descending and rectosigmoid colon with surrounding pericolonic stranding, consistent with colitis, which may be infectious, inflammatory or vascular in etiology particularly given some extensive plaque at the IMA origin and the distal colonic distribution. No evidence of perforation or abscess. 2. Urinary bladder is decompressed however there is marked mural thickening and mildperivesicular hazy stranding. Bilateral renal atrophy and vascular calcifications with chronic mild bilateral perinephric stranding and symmetrically delayed nephrograms and excretion. Can be seen in the setting of chronic renal disease or infection. Correlate with urinalysis to exclude cystitis and ascending tract infection. 3. Aortic Atherosclerosis (ICD10-I70.0). Electronically Signed   By: Lovena Le M.D.   On: 03/15/2020 21:56    EKG: Not performed.  Assessment/Plan Principal  Problem:   Colitis Active Problems:   Multiple myeloma not having achieved remission (HCC)   ESRD on hemodialysis ([place])   DM type 2 (diabetes mellitus, type 2) ([place])   Seizure disorder ([place])   COPD (chronic obstructive pulmonary disease) ([place])  Isaiah [name] is a 54 y.o. male with medical history significant for ESRD on MWF HD, multiple myeloma (currently on weekly Velcade  & monthly daratumumab), history of subdural hematoma (with residual right hemiparesis, dysarthria), history of DVT/PE s/p IVC  filter, history of CVA, paroxysmal atrial fibrillation not on anticoagulation, insulin-dependent type 2 diabetes, COPD, anemia of chronic disease, seizure disorder, and OSA who is admitted with colitis with BRBPR.  Colitis with BRBPR: CT imaging suggestive of colitis, unspecified noninfectious, inflammatory, or possibly vascular etiology.  BRBPR may be secondary to colitis versus diverticular or hemorrhoidal bleeding.  He has leukocytosis admission and has been started on empiric antibiotics. -Continue empiric antibiotics for now with IV Cipro/Flagyl -Repeat CBC in a.m., transfuse for hemoglobin less than 7 or significant drop in hemoglobin -Keep n.p.o. for now  ESRD on MWF HD: Currently no emergent need for dialysis.  Will need nephrology consultation in the morning for routine dialysis.  Paroxysmal atrial fibrillation: Not on anticoagulation due to history of subdural hematoma.  In regular rhythm with controlled rate on admission.  COPD: Chronic and stable without acute respiratory issue.  Continue home Breo and as needed albuterol.  Seizure disorder: Continue Lamictal and Vimpat.  Multiple myeloma:  Follows with oncology, Dr. Alen Blew.  Currently on treatment with Velcade and daratumumab.  Insulin-dependent type 2 diabetes: Continue home Basaglar 10 units daily plus sensitive SSI.  OSA: Not requiring CPAP.  Can use nocturnal supplemental O2 via [place] if needed.  DVT prophylaxis:  SCDs Code Status: Full code, confirmed with patient Family Communication: Discussed with patient's spouse [facility] by phone [phone] Disposition Plan: From home and likely discharge to home pending further evaluation/management of colitis/BRBPR Consults called: None Admission status:  Status is: Observation  The patient remains OBS appropriate and will d/c before 2 midnights.  Dispo: The patient is from: Home              Anticipated d/c is to: Home              Anticipated d/c date is: 1 day pending management of colitis and resolution of GI bleeding.              Patient currently is not medically stable to d/c.   Zada Finders MD [facility]  If 7PM-7AM, please contact night-coverage [email or web address]  03/15/2020, 11:00 PM

## 2020-03-15 NOTE — ED Notes (Signed)
Family member to sort desk inquiring about wait time. Explained rooming process and acuity.

## 2020-03-15 NOTE — Progress Notes (Signed)
Pharmacy Antibiotic Note  Artez Regis [name] is a 54 y.o. male admitted on 03/15/2020 with intra-abdominal infection.  Pharmacy has been consulted for Ciprofloxacin dosing. Of note patient has h/o of HD. WBC elevated.   Plan: -Ciprofloxacin 400 mg IV Q 24 hours -Monitor CBC, renal fx, cultures and clinical progress  Height: 5' 10"  (177.8 cm) Weight: 80.3 kg (177 lb 0.5 oz) IBW/kg (Calculated) : 73  Temp (24hrs), Avg:99.3 F (37.4 C), Min:99.3 F (37.4 C), Max:99.3 F (37.4 C)  Recent Labs  Lab 03/15/20 1631  WBC 15.8*  CREATININE 8.68*    Estimated Creatinine Clearance: 9.8 mL/min (A) (by C-G formula based on SCr of 8.68 mg/dL (H)).    Allergies  Allergen Reactions  . Codeine Swelling  . Codeine Rash and Other (See Comments)    Unknown reaction (patient says it was more serious than just a rash, but he can't remember what happened)     Antimicrobials this admission: Cipro 7/8 >>   Dose adjustments this admission:  Microbiology results: 7/8 BCx:   Thank you for allowing pharmacy to be a part of this patient's care.  Albertina Parr, PharmD., BCPS, BCCCP Clinical Pharmacist Clinical phone for 03/15/20 until 11:30pm: [phone] If after 11:30pm, please refer to [facility] for unit-specific pharmacist

## 2020-03-15 NOTE — Telephone Encounter (Signed)
Mrs. Gerrard said that the pt 3 bowel movements yesterday and he was feeling tired, the pt had a bowel movement this morning and it had bright red in it, then later today she noticed that he had blood in his pull up and before she could get another pull up on good there was blood coming from his rectum.  Mrs. Minish was notified that Dr. Baird Cancer said the pt needed to go to the ER to be evaluated because of the excessive amount of bleeding.

## 2020-03-16 ENCOUNTER — Other Ambulatory Visit: Payer: Self-pay

## 2020-03-16 DIAGNOSIS — I480 Paroxysmal atrial fibrillation: Secondary | ICD-10-CM | POA: Diagnosis present

## 2020-03-16 DIAGNOSIS — K922 Gastrointestinal hemorrhage, unspecified: Secondary | ICD-10-CM | POA: Diagnosis not present

## 2020-03-16 DIAGNOSIS — R1032 Left lower quadrant pain: Secondary | ICD-10-CM | POA: Diagnosis not present

## 2020-03-16 DIAGNOSIS — Z992 Dependence on renal dialysis: Secondary | ICD-10-CM | POA: Diagnosis not present

## 2020-03-16 DIAGNOSIS — N2581 Secondary hyperparathyroidism of renal origin: Secondary | ICD-10-CM | POA: Diagnosis not present

## 2020-03-16 DIAGNOSIS — Z841 Family history of disorders of kidney and ureter: Secondary | ICD-10-CM | POA: Diagnosis not present

## 2020-03-16 DIAGNOSIS — N186 End stage renal disease: Secondary | ICD-10-CM | POA: Diagnosis not present

## 2020-03-16 DIAGNOSIS — G40909 Epilepsy, unspecified, not intractable, without status epilepticus: Secondary | ICD-10-CM | POA: Diagnosis present

## 2020-03-16 DIAGNOSIS — Z20822 Contact with and (suspected) exposure to covid-19: Secondary | ICD-10-CM | POA: Diagnosis present

## 2020-03-16 DIAGNOSIS — I953 Hypotension of hemodialysis: Secondary | ICD-10-CM | POA: Diagnosis present

## 2020-03-16 DIAGNOSIS — K51511 Left sided colitis with rectal bleeding: Secondary | ICD-10-CM | POA: Diagnosis present

## 2020-03-16 DIAGNOSIS — Z86718 Personal history of other venous thrombosis and embolism: Secondary | ICD-10-CM | POA: Diagnosis not present

## 2020-03-16 DIAGNOSIS — Z794 Long term (current) use of insulin: Secondary | ICD-10-CM | POA: Diagnosis not present

## 2020-03-16 DIAGNOSIS — E1129 Type 2 diabetes mellitus with other diabetic kidney complication: Secondary | ICD-10-CM | POA: Diagnosis not present

## 2020-03-16 DIAGNOSIS — G4733 Obstructive sleep apnea (adult) (pediatric): Secondary | ICD-10-CM | POA: Diagnosis present

## 2020-03-16 DIAGNOSIS — D5740 Sickle-cell thalassemia without crisis: Secondary | ICD-10-CM | POA: Diagnosis present

## 2020-03-16 DIAGNOSIS — Z8719 Personal history of other diseases of the digestive system: Secondary | ICD-10-CM | POA: Diagnosis not present

## 2020-03-16 DIAGNOSIS — I120 Hypertensive chronic kidney disease with stage 5 chronic kidney disease or end stage renal disease: Secondary | ICD-10-CM | POA: Diagnosis present

## 2020-03-16 DIAGNOSIS — D631 Anemia in chronic kidney disease: Secondary | ICD-10-CM | POA: Diagnosis not present

## 2020-03-16 DIAGNOSIS — Z86711 Personal history of pulmonary embolism: Secondary | ICD-10-CM | POA: Diagnosis not present

## 2020-03-16 DIAGNOSIS — I69322 Dysarthria following cerebral infarction: Secondary | ICD-10-CM | POA: Diagnosis not present

## 2020-03-16 DIAGNOSIS — I69351 Hemiplegia and hemiparesis following cerebral infarction affecting right dominant side: Secondary | ICD-10-CM | POA: Diagnosis not present

## 2020-03-16 DIAGNOSIS — K559 Vascular disorder of intestine, unspecified: Secondary | ICD-10-CM | POA: Diagnosis not present

## 2020-03-16 DIAGNOSIS — I959 Hypotension, unspecified: Secondary | ICD-10-CM | POA: Diagnosis not present

## 2020-03-16 DIAGNOSIS — K529 Noninfective gastroenteritis and colitis, unspecified: Secondary | ICD-10-CM | POA: Diagnosis not present

## 2020-03-16 DIAGNOSIS — E1122 Type 2 diabetes mellitus with diabetic chronic kidney disease: Secondary | ICD-10-CM | POA: Diagnosis present

## 2020-03-16 DIAGNOSIS — J449 Chronic obstructive pulmonary disease, unspecified: Secondary | ICD-10-CM | POA: Diagnosis present

## 2020-03-16 DIAGNOSIS — Z823 Family history of stroke: Secondary | ICD-10-CM | POA: Diagnosis not present

## 2020-03-16 DIAGNOSIS — C9000 Multiple myeloma not having achieved remission: Secondary | ICD-10-CM | POA: Diagnosis present

## 2020-03-16 LAB — BASIC METABOLIC PANEL
Anion gap: 18 — ABNORMAL HIGH (ref 5–15)
BUN: 50 mg/dL — ABNORMAL HIGH (ref 6–20)
CO2: 26 mmol/L (ref 22–32)
Calcium: 8.8 mg/dL — ABNORMAL LOW (ref 8.9–10.3)
Chloride: 93 mmol/L — ABNORMAL LOW (ref 98–111)
Creatinine, Ser: 10.15 mg/dL — ABNORMAL HIGH (ref 0.61–1.24)
GFR calc Af Amer: 6 mL/min — ABNORMAL LOW (ref >60)
GFR calc non Af Amer: 5 mL/min — ABNORMAL LOW (ref >60)
Glucose, Bld: 82 mg/dL (ref 70–99)
Potassium: 4.4 mmol/L (ref 3.5–5.1)
Sodium: 137 mmol/L (ref 135–145)

## 2020-03-16 LAB — CBC
HCT: 29.2 % — ABNORMAL LOW (ref 39.0–52.0)
Hemoglobin: 10.6 g/dL — ABNORMAL LOW (ref 13.0–17.0)
MCH: 32.9 pg (ref 26.0–34.0)
MCHC: 36.3 g/dL — ABNORMAL HIGH (ref 30.0–36.0)
MCV: 90.7 fL (ref 80.0–100.0)
Platelets: 262 10*3/uL (ref 150–400)
RBC: 3.22 MIL/uL — ABNORMAL LOW (ref 4.22–5.81)
RDW: 16.8 % — ABNORMAL HIGH (ref 11.5–15.5)
WBC: 18.6 10*3/uL — ABNORMAL HIGH (ref 4.0–10.5)
nRBC: 5.3 % — ABNORMAL HIGH (ref 0.0–0.2)

## 2020-03-16 LAB — GLUCOSE, CAPILLARY
Glucose-Capillary: 85 mg/dL (ref 70–99)
Glucose-Capillary: 88 mg/dL (ref 70–99)
Glucose-Capillary: 80 mg/dL (ref 70–99)
Glucose-Capillary: 66 mg/dL — ABNORMAL LOW (ref 70–99)
Glucose-Capillary: 78 mg/dL (ref 70–99)
Glucose-Capillary: 149 mg/dL — ABNORMAL HIGH (ref 70–99)

## 2020-03-16 LAB — HEMOGLOBIN A1C
Hgb A1c MFr Bld: 5.5 % (ref 4.8–5.6)
Mean Plasma Glucose: 111 mg/dL

## 2020-03-16 LAB — PATHOLOGIST SMEAR REVIEW

## 2020-03-16 MED ORDER — CHLORHEXIDINE GLUCONATE CLOTH 2 % EX PADS
6.0000 | MEDICATED_PAD | Freq: Every day | CUTANEOUS | Status: DC
  Administered 2020-03-16: 6 via TOPICAL

## 2020-03-16 MED ORDER — FERRIC CITRATE 1 GM 210 MG(FE) PO TABS
210.0000 mg | ORAL_TABLET | Freq: Three times a day (TID) | ORAL | Status: DC
  Administered 2020-03-16 – 2020-03-20 (×9): 210 mg via ORAL
  Filled 2020-03-16 (×9): qty 1

## 2020-03-16 MED ORDER — LACOSAMIDE 200 MG PO TABS
200.0000 mg | ORAL_TABLET | ORAL | Status: DC
  Administered 2020-03-16 – 2020-03-19 (×6): 200 mg via ORAL
  Filled 2020-03-16 (×6): qty 1

## 2020-03-16 MED ORDER — CALCITRIOL 0.5 MCG PO CAPS
1.5000 ug | ORAL_CAPSULE | ORAL | Status: DC
  Administered 2020-03-16 – 2020-03-19 (×2): 1.5 ug via ORAL
  Filled 2020-03-16 (×2): qty 3

## 2020-03-16 MED ORDER — MIDODRINE HCL 5 MG PO TABS
10.0000 mg | ORAL_TABLET | Freq: Two times a day (BID) | ORAL | Status: DC
  Administered 2020-03-16 – 2020-03-20 (×9): 10 mg via ORAL
  Filled 2020-03-16 (×9): qty 2

## 2020-03-16 NOTE — Progress Notes (Signed)
New Admission Note:  Arrival Method: Stretcher Mental Orientation: Alert and oriented x 4 Telemetry: N/A Assessment: Completed Skin: Warm and dry.  IV: NSL Pain: Denies  Tubes: N/A Safety Measures: Safety Fall Prevention Plan initiated.  Admission: Completed 5 M  Orientation: Patient has been orientated to the room, unit and the staff. Welcome booklet given.  Family: None  Orders have been reviewed and implemented. Will continue to monitor the patient. Call light has been placed within reach and bed alarm has been activated.   Sima Matas BSN, RN  Phone Number: [phone]

## 2020-03-16 NOTE — Consult Note (Signed)
[facility] Renal Consultation Note    Indication for Consultation:  Management of ESRD/hemodialysis; anemia, hypertension/volume and secondary hyperparathyroidism  YDX:AJOINOM, [name], MD  HPI: Isaiah [name] is a 54 y.o. male. ESRD 2/2 HTN/DM on HD MWF at [facility]  Past medical history significant for seizure d/o, CVA, OSA on CPAP, Hx PE/DVT s/p IVC filter no longer on anticoagulation d/t GIB, multiple myeloma (currently on Velcade and daratumumab), HTN, DM, A fib, and chronic SDH with residual R hemiparesis and dysarthria.  Of note patient is compliant with prescribed dialysis regimen which are tolerated well using RU AVF.    Patient seen and examined at bedside during dialysis.  Majority of history obtained through chart review as patient is a poor historian.  Presented to the ED on 7/8 due to BRBPR.  Mild abdominal cramping and 3 episodes of diarrhea reported following dialysis on 03/14/20.  The following morning he began having bleeding, which changed from bright red to dark red in color.  Denies fever, chills, SOB, CP, abdomina; pain, weakness, dizziness and fatigue.  Reports no abdominal pain or bleeding prior this recently.  Previous GIB in 2017 which was determined to be a small intestinal tear and was repaired per notes.    Pertinent findings since admission include hypotension, +FOBT, CT abd/pelvis with "edematous thickening of the distal colon from the splenic flexure through the descending and rectosigmoid colon with surrounding pericolonic stranding, consistent with colitis, which may be infectious, inflammatory or vascular in etiology particularly given some extensive plaque at the IMA origin and the distal colonic distribution. No evidence of perforation or abscess."  Patient has been admitted for further evaluation and management.   Past Medical History:  Diagnosis Date  . A-fib ([place])   . Anemia   . Asthma   . DM type 2 (diabetes mellitus, type 2)  ([place]) 06/09/2019  . ESRD (end stage renal disease) on dialysis ([place])   . Essential hypertension 06/09/2019  . GIB (gastrointestinal bleeding)    Recurrent episodes- 09/2014, 09/2015 and 05/2016  . Gout   . History of recent blood transfusion 10/27/14   2 Units PRBC's  . Hyperkalemia 07/2011  . Multiple myeloma ([place])   . OSA on CPAP   . Pulmonary embolism ([place]) 07/2011; 09/27/2014   a. Bilat PE 07/2011 - unclear cause, tx with 6 months Coumadin.;   . Seizure disorder ([place]) 06/09/2019  . Sepsis ([place])   . Sickle cell-thalassemia disease ([place])    a. Sickle cell trait.  . Sleep apnea   . Stroke ([place]) 09/2015   R-MCA, L-MCA, PCA and bilateral cerebellar complicated by DVT/PE  . Subdural hematoma ([place]) 05/2019   Past Surgical History:  Procedure Laterality Date  . AV FISTULA PLACEMENT Right 12/05/2015   Procedure: INSERTION OF ARTERIOVENOUS (AV) GORE-TEX GRAFT ARM;  Surgeon: Elam Dutch, MD;  Location: [place];  Service: Vascular;  Laterality: Right;  . BONE MARROW BIOPSY    . CATARACT EXTRACTION  08/2019  . CATARACT EXTRACTION  09/2019  . CHOLECYSTECTOMY    . CHOLECYSTECTOMY  1990's?  . COLONOSCOPY WITH PROPOFOL N/A 01/22/2017   Procedure: COLONOSCOPY WITH PROPOFOL;  Surgeon: Wilford Corner, MD;  Location: [facility];  Service: Endoscopy;  Laterality: N/A;  . EYE SURGERY Right   . INSERTION OF DIALYSIS CATHETER Right 10/16/2015   Procedure: INSERTION OF PALINDROME DIALYSIS CATHETER ;  Surgeon: Elam Dutch, MD;  Location: [place];  Service: Vascular;  Laterality: Right;  . OTHER SURGICAL HISTORY  Retinal surgery  . PARS PLANA VITRECTOMY  02/17/2012   Procedure: PARS PLANA VITRECTOMY WITH 25 GAUGE;  Surgeon: Hayden Pedro, MD;  Location: [place];  Service: Ophthalmology;  Laterality: Right;  . PERIPHERAL VASCULAR CATHETERIZATION N/A 03/20/2016   Procedure: A/V Shuntogram/Fistulagram;  Surgeon: Conrad [place], MD;  Location: [place] [facility];  Service: Cardiovascular;  Laterality:  N/A;  . TEE WITHOUT CARDIOVERSION N/A 12/02/2016   Procedure: TRANSESOPHAGEAL ECHOCARDIOGRAM (TEE);  Surgeon: Larey Dresser, MD;  Location: [facility] ENDOSCOPY;  Service: Cardiovascular;  Laterality: N/A;   Family History  Problem Relation Age of Onset  . Hypertension Mother   . Hypertension Father   . Stroke Father   . Sickle cell anemia Brother   . Diabetes Mother   . Sickle cell anemia Brother   . Diabetes type I Brother   . Kidney disease Brother    Social History:  reports that he has never smoked. He has never used smokeless tobacco. He reports previous alcohol use. He reports that he does not use drugs. Allergies  Allergen Reactions  . Codeine Swelling  . Codeine Rash and Other (See Comments)    Unknown reaction (patient says it was more serious than just a rash, but he can't remember what happened)    Prior to Admission medications   Medication Sig Start Date End Date Taking? Authorizing Provider  acetaminophen (TYLENOL) 325 MG tablet Take 1-2 tablets (325-650 mg total) by mouth every 4 (four) hours as needed for mild pain. 06/20/19  Yes Love, Ivan Anchors, PA-C  acyclovir (ZOVIRAX) 400 MG tablet TAKE 1 TABLET BY MOUTH ONCE DAILY Patient taking differently: Take 400 mg by mouth daily.  02/07/20  Yes [name], Mathis Dad, MD  AURYXIA 1 GM 210 MG(Fe) tablet Take 210 mg by mouth 3 (three) times daily. 11/30/19  Yes [provider]  BREO ELLIPTA 200-25 MCG/INH AEPB INHALE 1 PUFF BY MOUTH AT [place] Patient taking differently: Inhale 1 puff into the lungs daily.  01/16/20  Yes [name], MD  camphor-menthol [facility]) lotion Apply 1 application topically every 8 (eight) hours as needed for itching. 06/30/19  Yes Love, Ivan Anchors, PA-C  Cetirizine HCl 10 MG CAPS Take 1 capsule by mouth daily.   Yes [provider]  clonazePAM Bobbye [place]) 0.5 MG tablet Take 2 tablets every Monday, Wednesday, and Friday with hemodialysis Patient taking differently: Take 1 mg by mouth  every Monday, Wednesday, and Friday. Before dialysis 01/12/20  Yes Cameron Sprang, MD  dexamethasone (DECADRON) 4 MG tablet Take 5 tablets (20 mg total) by mouth once a week. Take 5 tablets (20 mg total) by mouth once a week on day of chemo. Patient taking differently: Take 20 mg by mouth every 30 (thirty) days. On the chemo days. 09/06/19  Yes Wyatt Portela, MD  hydrOXYzine (ATARAX/VISTARIL) 25 MG tablet Take 1 tablet (25 mg total) by mouth every 8 (eight) hours as needed for itching. 06/30/19  Yes Love, Ivan Anchors, PA-C  Insulin Glargine (BASAGLAR KWIKPEN) 100 UNIT/ML Inject 10 Units into the skin daily.  12/08/19  Yes [provider]  Insulin Lispro (HUMALOG KWIKPEN [place]) Inject 2-4 Units into the skin 3 (three) times daily before meals. Sliding scale: 150-250 =2 units, 250 -350=3 units, 350 += 4 units   Yes [provider]  lacosamide (VIMPAT) 200 MG TABS tablet Take 1 tablet twice a day except on dialysis days M-W-F give 1 tab in AM, 1 tab after dialysis,  1 tab in PM. Patient taking differently: Take 200 mg by mouth See admin instructions. Take 1 tablet twice a day except on dialysis days M-W-F give 1 tab in AM, 1 tab after dialysis, 1 tab in PM. 01/12/20  Yes Cameron Sprang, MD  lamoTRIgine (LAMICTAL) 200 MG tablet Take 1 tablet (200 mg total) by mouth 2 (two) times daily. 01/12/20  Yes Cameron Sprang, MD  midodrine (PROAMATINE) 5 MG tablet Take one tablet by mouth 30 minutes before dialysis. Patient taking differently: Take 5 mg by mouth See admin instructions. Take one tablet by mouth on Monday Wednesday and Friday 30 minutes before dialysis. 10/31/19  Yes [name], MD  multivitamin (RENA-VIT) TABS tablet Take 1 tablet by mouth at bedtime.   Yes [provider]  Nutritional Supplements (FEEDING SUPPLEMENT, NEPRO CARB STEADY,) LIQD Take 237 mLs by mouth 3 (three) times daily as needed (Supplement). 10/07/19  Yes Adele Barthel D, MD  pantoprazole (PROTONIX) 40 MG  tablet TAKE 1 TABLET BY MOUTH ONCE DAILY Patient taking differently: Take 40 mg by mouth daily.  11/23/19  Yes [name], MD  prochlorperazine (COMPAZINE) 10 MG tablet TAKE 1 TABLET (10 MG TOTAL) BY MOUTH EVERY 6 (SIX) HOURS AS NEEDED FOR NAUSEA OR VOMITING. 11/15/18  Yes Wyatt Portela, MD  albuterol (PROAIR HFA) 108 (90 Base) MCG/ACT inhaler Inhale two puffs every 4-6 hours if needed for cough or wheeze Patient taking differently: Inhale 2 puffs into the lungs every 4 (four) hours as needed for wheezing or shortness of breath. Inhale two puffs every 4-6 hours if needed for cough or wheeze 01/26/20   [name], MD  Alcohol Swabs (SM ALCOHOL PREP) 70 % PADS  07/19/19   [provider]  FREESTYLE LITE test strip  07/21/19   [provider]  Lancets (FREESTYLE) lancets  07/21/19   [provider]  UNIFINE PENTIPS 31G X 8 MM MISC USE AS DIRECTED DAILY AFTER SUPPER 12/10/19   [name], MD   Current Facility-Administered Medications  Medication Dose Route Frequency Provider Last Rate Last Admin  . acetaminophen (TYLENOL) tablet 650 mg  650 mg Oral Q6H PRN Lenore Cordia, MD       Or  . acetaminophen (TYLENOL) suppository 650 mg  650 mg Rectal Q6H PRN Zada Finders R, MD      . albuterol (PROVENTIL) (2.5 MG/3ML) 0.083% nebulizer solution 3 mL  3 mL Inhalation Q4H PRN Lenore Cordia, MD      . Chlorhexidine Gluconate Cloth 2 % PADS 6 each  6 each Topical Q0600 Roney Jaffe, MD   6 each at 03/16/20 1026  . ciprofloxacin (CIPRO) IVPB 400 mg  400 mg Intravenous Q24H Lavenia Atlas, RPH 200 mL/hr at 03/16/20 0030 400 mg at 03/16/20 0030  . fluticasone furoate-vilanterol (BREO ELLIPTA) 200-25 MCG/INH 1 puff  1 puff Inhalation Daily Lenore Cordia, MD   1 puff at 03/16/20 0733  . insulin aspart (novoLOG) injection 0-9 Units  0-9 Units Subcutaneous TID WC Patel, Vishal R, MD      . insulin glargine (LANTUS) injection 10 Units  10 Units Subcutaneous Daily  Lenore Cordia, MD      . Derrill Memo ON 03/17/2020] lacosamide (VIMPAT) tablet 200 mg  200 mg Oral 2 times per day on Sun Tue Thu Sat Zada Finders R, MD      . lacosamide (VIMPAT) tablet 200 mg  200 mg Oral 3 times per day on Mon Wed Fri Patel,  Vishal R, MD   200 mg at 03/16/20 0618  . lamoTRIgine (LAMICTAL) tablet 200 mg  200 mg Oral BID Zada Finders R, MD   200 mg at 03/16/20 1027  . metroNIDAZOLE (FLAGYL) IVPB 500 mg  500 mg Intravenous Q8H Zada Finders R, MD 100 mL/hr at 03/16/20 0621 500 mg at 03/16/20 0621  . midodrine (PROAMATINE) tablet 10 mg  10 mg Oral BID WC Domenic Polite, MD   10 mg at 03/16/20 1030  . multivitamin (RENA-VIT) tablet 1 tablet  1 tablet Oral QHS Patel, Vishal R, MD      . pantoprazole (PROTONIX) EC tablet 40 mg  40 mg Oral Daily Zada Finders R, MD   40 mg at 03/16/20 1027  . sodium chloride flush (NS) 0.9 % injection 3 mL  3 mL Intravenous Once Daleen Bo, MD       Labs: Basic Metabolic Panel: Recent Labs  Lab 03/15/20 1631 03/16/20 0436  NA 138 137  K 4.6 4.4  CL 92* 93*  CO2 29 26  GLUCOSE 237* 82  BUN 42* 50*  CREATININE 8.68* 10.15*  CALCIUM 9.2 8.8*   Liver Function Tests: Recent Labs  Lab 03/15/20 1631  AST 52*  ALT 28  ALKPHOS 129*  BILITOT 1.5*  PROT 7.1  ALBUMIN 4.1   Recent Labs  Lab 03/15/20 1631  LIPASE 35   CBC: Recent Labs  Lab 03/15/20 1631 03/16/20 0436  WBC 15.8* 18.6*  HGB 12.3* 10.6*  HCT 33.7* 29.2*  MCV 90.8 90.7  PLT 283 262   CBG: Recent Labs  Lab 03/15/20 2339 03/16/20 0412 03/16/20 0739 03/16/20 1116  GLUCAP 105* 85 88 80   Studies/Results: CT Abdomen Pelvis W Contrast  Result Date: 03/15/2020 CLINICAL DATA:  Rectal bleeding EXAM: CT ABDOMEN AND PELVIS WITH CONTRAST TECHNIQUE: Multidetector CT imaging of the abdomen and pelvis was performed using the standard protocol following bolus administration of intravenous contrast. CONTRAST:  129m OMNIPAQUE IOHEXOL 300 MG/ML  SOLN COMPARISON:  CT  08/09/2019 FINDINGS: Lower chest: Atelectatic changes noted in the lung bases. Normal heart size. No pericardial effusion. Small subcutaneous nodule along the right anterior chest wall measuring up to 1.6 cm, unchanged from prior and likely reflective of small dermal inclusion cyst. Some posterior chest wall edematous changes and skin thickening are noted. Hepatobiliary: No focal liver abnormality is seen. Patient is post cholecystectomy. Slight prominence of the biliary tree likely related to reservoir effect. No calcified intraductal gallstones. Pancreas: No visible or concerning pancreatic lesion. Mild pancreatic atrophy particularly towards the head neck junction. No peripancreatic inflammation or ductal dilatation. Spleen: Diminutive spleen with peripheral calcifications similar to the comparison exam. No new splenic lesions. Adrenals/Urinary Tract: Mild thickening of the adrenal glands without discrete nodule. Bilateral renal atrophy and vascular calcifications with chronic mild bilateral perinephric stranding and symmetrically delayed nephrograms and excretion. Multiple bilateral fluid attenuation cysts are present. No obstructive urolithiasis or hydronephrosis is seen. Urinary bladder is decompressed at the time of imaging with some marked mural thickening and intramural fatty infiltration as well as mild perivesicular hazy stranding. Stomach/Bowel: Distal esophagus, stomach and duodenal sweep are unremarkable. No small bowel wall thickening or dilatation. No evidence of obstruction. Appendix is fluid-filled but normal caliber without focal periappendiceal stranding or inflammation to suggest acute appendicitis. Moderate proximal colonic stool burden without wall thickening or dilatation. Edematous thickening of the distal colon is seen from the splenic flexure through the descending and rectosigmoid colon. Surrounding pericolonic stranding is present. Much of  the distal colon is decompressed or air-filled  with a small amount of liquid material within the rectal vault. No pneumatosis or air within the draining mesentery. No evidence of perforation or abscess. Vascular/Lymphatic: Atherosclerotic calcifications seen throughout the abdominal aorta and branch vessels including fairly significant plaque at the IMA origin which may result in at least some moderate to high-grade stenosis incompletely assessed on a non angiographic exam. No aneurysm or ectasia. Reproductive: The prostate and seminal vesicles are unremarkable. Other: Asymmetric atrophy of the right rectus sheath and postsurgical changes of the right upper quadrant mild abdominal wall laxity. No bowel containing hernia. No abdominopelvic free air or fluid. Musculoskeletal: Multilevel discogenic and facet degenerative changes throughout the spine. Multilevel flowing anterior osteophytosis of the lower thoracic spine, compatible with features of diffuse idiopathic skeletal hyperostosis (DISH) though there is ankylosis of the bilateral SI joints. Additional degenerative changes in the hips. No acute osseous abnormality or suspicious osseous lesion. IMPRESSION: 1. Edematous thickening of the distal colon from the splenic flexure through the descending and rectosigmoid colon with surrounding pericolonic stranding, consistent with colitis, which may be infectious, inflammatory or vascular in etiology particularly given some extensive plaque at the IMA origin and the distal colonic distribution. No evidence of perforation or abscess. 2. Urinary bladder is decompressed however there is marked mural thickening and mildperivesicular hazy stranding. Bilateral renal atrophy and vascular calcifications with chronic mild bilateral perinephric stranding and symmetrically delayed nephrograms and excretion. Can be seen in the setting of chronic renal disease or infection. Correlate with urinalysis to exclude cystitis and ascending tract infection. 3. Aortic Atherosclerosis  (ICD10-I70.0). Electronically Signed   By: Lovena Le M.D.   On: 03/15/2020 21:56    ROS: All others negative except those listed in HPI.  Physical Exam: Vitals:   03/16/20 1400 03/16/20 1430 03/16/20 1500 03/16/20 1530  BP: (!) 74/49 (!) 77/45 (!) 81/33 (!) 79/49  Pulse: 70 72 75 69  Resp:      Temp:      TempSrc:      SpO2:      Weight:      Height:         General: WDWN, male in NAD laying in bed Head: NCAT sclera not icteric Neck: Supple.  Lungs: CTA bilaterally anterolaterally . Breathing is unlabored. Heart: RRR. No murmur, rubs or gallops.  Abdomen: soft, +tenderness LLQ, +BS, no guarding, no rebound tenderness Lower extremities:no edema, ischemic changes, or open wounds  Neuro: AAOx3. Moves all extremities spontaneously. Dialysis Access: RU AVF accessed  Dialysis Orders:  MWF - [facility]  4hrs, BFR 400, DFR 800,  EDW 83kg, 2K/ 2Ca  Access: RU AVF  Heparin none Mircera 30 mcg q2wks - last 03/14/20 Calcitriol 1.5 mcg PO qHD  Assessment/Plan: 1.  Colitis with BRBPR - identified on CT, etiology unclear.  FOBT+.  Hgb stable at 10.6. On ABX, NPO for now. Per primary. 2.  ESRD -  On HD MWF.  HD today per regular schedule.  3.  Hypotension/volume  - Hypotension, especially with HD requiring fluid bolus 446m today.  On midodrine.  Does not appear volume overloaded.  Running even today.  UF as tolerated. Meeting dry as OP.  4.  Anemia of CKD - Hgb stable.  Slowly trending down over last month from 11.5>10.5.  ESA just restarted.  Follow trends.  5.  Secondary Hyperparathyroidism -  Ca and phos at goal.  Continue binders/VDRA once eating. 6.  Nutrition - NPO, Renal diet w/fluid restrictions  once advanced.  7. Seizures - per primary 8. Hx CVA  9. OSA on CPAP 10. Hx PE/DVT - s/p IVC filter 11. A fib 12. Multiple myeloma currently on Velcade and daratumumab 13. Chronic SDH  Jen Mow, PA-C [facility] Pager: [phone] 03/16/2020, 3:36 PM

## 2020-03-16 NOTE — Consult Note (Signed)
Referring Provider: Dr. Domenic Polite Primary Care Physician:  [name], MD Primary Gastroenterologist:  Dr. Michail Sermon [facility])  Reason for Consultation:  Rectal bleeding, colitis on CT imaging  HPI: [name] is a 54 y.o. male with past medical history noted below to include ESRD on HD, A fib, multiple myeloma, and chronic hypotension presenting with rectal bleeding and colitis on CT imaging.  Patient states he was in his usual state of health until yesterday, when he started passing bright red blood per rectum.  He had 1 loose stool but otherwise just passed red blood alone.  He states he had some left lower abdominal cramping during dialysis on Wednesday, but denies current abdominal pain.  He experiences hypotension during dialysis and is on midodrine.  Patient denies any changes in appetite, unexplained weight loss, GERD, or dysphagia.  He states he has had darker stools since starting iron supplementation.  Patient had a colonoscopy 01/2017: Internal hemorrhoids, three 2 to 3 mm polyps in the cecum, one 3 mm polyp in the sigmoid colon (polyps were benign per pathology).  He has had EGDs in the past as well with history of erosive esophagitis and gastritis.  Past Medical History:  Diagnosis Date  . A-fib ([place])   . Anemia   . Asthma   . DM type 2 (diabetes mellitus, type 2) ([place]) 06/09/2019  . ESRD (end stage renal disease) on dialysis ([place])   . Essential hypertension 06/09/2019  . GIB (gastrointestinal bleeding)    Recurrent episodes- 09/2014, 09/2015 and 05/2016  . Gout   . History of recent blood transfusion 10/27/14   2 Units PRBC's  . Hyperkalemia 07/2011  . Multiple myeloma ([place])   . OSA on CPAP   . Pulmonary embolism ([place]) 07/2011; 09/27/2014   a. Bilat PE 07/2011 - unclear cause, tx with 6 months Coumadin.;   . Seizure disorder ([place]) 06/09/2019  . Sepsis ([place])   . Sickle cell-thalassemia disease ([place])    a. Sickle cell trait.  . Sleep apnea   . Stroke ([place])  09/2015   R-MCA, L-MCA, PCA and bilateral cerebellar complicated by DVT/PE  . Subdural hematoma ([place]) 05/2019    Past Surgical History:  Procedure Laterality Date  . AV FISTULA PLACEMENT Right 12/05/2015   Procedure: INSERTION OF ARTERIOVENOUS (AV) GORE-TEX GRAFT ARM;  Surgeon: Elam Dutch, MD;  Location: [place];  Service: Vascular;  Laterality: Right;  . BONE MARROW BIOPSY    . CATARACT EXTRACTION  08/2019  . CATARACT EXTRACTION  09/2019  . CHOLECYSTECTOMY    . CHOLECYSTECTOMY  1990's?  . COLONOSCOPY WITH PROPOFOL N/A 01/22/2017   Procedure: COLONOSCOPY WITH PROPOFOL;  Surgeon: Wilford Corner, MD;  Location: [facility];  Service: Endoscopy;  Laterality: N/A;  . EYE SURGERY Right   . INSERTION OF DIALYSIS CATHETER Right 10/16/2015   Procedure: INSERTION OF PALINDROME DIALYSIS CATHETER ;  Surgeon: Elam Dutch, MD;  Location: [place];  Service: Vascular;  Laterality: Right;  . OTHER SURGICAL HISTORY     Retinal surgery  . PARS PLANA VITRECTOMY  02/17/2012   Procedure: PARS PLANA VITRECTOMY WITH 25 GAUGE;  Surgeon: Hayden Pedro, MD;  Location: [facility];  Service: Ophthalmology;  Laterality: Right;  . PERIPHERAL VASCULAR CATHETERIZATION N/A 03/20/2016   Procedure: A/V Shuntogram/Fistulagram;  Surgeon: Conrad Palestine, MD;  Location: [place] [facility];  Service: Cardiovascular;  Laterality: N/A;  . TEE WITHOUT CARDIOVERSION N/A 12/02/2016   Procedure: TRANSESOPHAGEAL ECHOCARDIOGRAM (TEE);  Surgeon: Elby Showers  Aundra Dubin, MD;  Location: [place];  Service: Cardiovascular;  Laterality: N/A;    Prior to Admission medications   Medication Sig Start Date End Date Taking? Authorizing Provider  acetaminophen (TYLENOL) 325 MG tablet Take 1-2 tablets (325-650 mg total) by mouth every 4 (four) hours as needed for mild pain. 06/20/19  Yes Love, Ivan Anchors, PA-C  acyclovir (ZOVIRAX) 400 MG tablet TAKE 1 TABLET BY MOUTH ONCE DAILY Patient taking differently: Take 400 mg by mouth daily.  02/07/20  Yes  [name], Mathis Dad, MD  AURYXIA 1 GM 210 MG(Fe) tablet Take 210 mg by mouth 3 (three) times daily. 11/30/19  Yes [provider]  BREO ELLIPTA 200-25 MCG/INH AEPB INHALE 1 PUFF BY MOUTH AT [place] Patient taking differently: Inhale 1 puff into the lungs daily.  01/16/20  Yes [name], MD  camphor-menthol [name]) lotion Apply 1 application topically every 8 (eight) hours as needed for itching. 06/30/19  Yes Love, Ivan Anchors, PA-C  Cetirizine HCl 10 MG CAPS Take 1 capsule by mouth daily.   Yes [provider]  clonazePAM Bobbye [place]) 0.5 MG tablet Take 2 tablets every Monday, Wednesday, and Friday with hemodialysis Patient taking differently: Take 1 mg by mouth every Monday, Wednesday, and Friday. Before dialysis 01/12/20  Yes Cameron Sprang, MD  dexamethasone (DECADRON) 4 MG tablet Take 5 tablets (20 mg total) by mouth once a week. Take 5 tablets (20 mg total) by mouth once a week on day of chemo. Patient taking differently: Take 20 mg by mouth every 30 (thirty) days. On the chemo days. 09/06/19  Yes Wyatt Portela, MD  hydrOXYzine (ATARAX/VISTARIL) 25 MG tablet Take 1 tablet (25 mg total) by mouth every 8 (eight) hours as needed for itching. 06/30/19  Yes Love, Ivan Anchors, PA-C  Insulin Glargine (BASAGLAR KWIKPEN) 100 UNIT/ML Inject 10 Units into the skin daily.  12/08/19  Yes [provider]  Insulin Lispro (HUMALOG KWIKPEN [place]) Inject 2-4 Units into the skin 3 (three) times daily before meals. Sliding scale: 150-250 =2 units, 250 -350=3 units, 350 += 4 units   Yes [provider]  lacosamide (VIMPAT) 200 MG TABS tablet Take 1 tablet twice a day except on dialysis days M-W-F give 1 tab in AM, 1 tab after dialysis, 1 tab in PM. Patient taking differently: Take 200 mg by mouth See admin instructions. Take 1 tablet twice a day except on dialysis days M-W-F give 1 tab in AM, 1 tab after dialysis, 1 tab in PM. 01/12/20  Yes Cameron Sprang, MD  lamoTRIgine  (LAMICTAL) 200 MG tablet Take 1 tablet (200 mg total) by mouth 2 (two) times daily. 01/12/20  Yes Cameron Sprang, MD  midodrine (PROAMATINE) 5 MG tablet Take one tablet by mouth 30 minutes before dialysis. Patient taking differently: Take 5 mg by mouth See admin instructions. Take one tablet by mouth on Monday Wednesday and Friday 30 minutes before dialysis. 10/31/19  Yes [name], MD  multivitamin (RENA-VIT) TABS tablet Take 1 tablet by mouth at bedtime.   Yes [provider]  Nutritional Supplements (FEEDING SUPPLEMENT, NEPRO CARB STEADY,) LIQD Take 237 mLs by mouth 3 (three) times daily as needed (Supplement). 10/07/19  Yes Adele Barthel D, MD  pantoprazole (PROTONIX) 40 MG tablet TAKE 1 TABLET BY MOUTH ONCE DAILY Patient taking differently: Take 40 mg by mouth daily.  11/23/19  Yes [name], MD  prochlorperazine (COMPAZINE) 10 MG tablet TAKE 1 TABLET (10 MG TOTAL) BY  MOUTH EVERY 6 (SIX) HOURS AS NEEDED FOR NAUSEA OR VOMITING. 11/15/18  Yes [name], Mathis Dad, MD  albuterol (PROAIR HFA) 108 (90 Base) MCG/ACT inhaler Inhale two puffs every 4-6 hours if needed for cough or wheeze Patient taking differently: Inhale 2 puffs into the lungs every 4 (four) hours as needed for wheezing or shortness of breath. Inhale two puffs every 4-6 hours if needed for cough or wheeze 01/26/20   [name], MD  Alcohol Swabs (SM ALCOHOL PREP) 70 % PADS  07/19/19   [provider]  FREESTYLE LITE test strip  07/21/19   [provider]  Lancets (FREESTYLE) lancets  07/21/19   [provider]  UNIFINE PENTIPS 31G X 8 MM MISC USE AS DIRECTED DAILY AFTER SUPPER 12/10/19   [name], MD    Scheduled Meds: . Chlorhexidine Gluconate Cloth  6 each Topical Q0600  . fluticasone furoate-vilanterol  1 puff Inhalation Daily  . insulin aspart  0-9 Units Subcutaneous TID WC  . insulin glargine  10 Units Subcutaneous Daily  . [START ON 03/17/2020] lacosamide  200 mg Oral 2 times per  day on Sun Tue Thu Sat  . lacosamide  200 mg Oral 3 times per day on Mon Wed Fri  . lamoTRIgine  200 mg Oral BID  . midodrine  10 mg Oral BID WC  . multivitamin  1 tablet Oral QHS  . pantoprazole  40 mg Oral Daily  . sodium chloride flush  3 mL Intravenous Once   Continuous Infusions: . ciprofloxacin 400 mg (03/16/20 0030)  . metronidazole 500 mg (03/16/20 5732)   PRN Meds:.acetaminophen **OR** acetaminophen, albuterol  Allergies as of 03/15/2020 - Review Complete 03/15/2020  Allergen Reaction Noted  . Codeine Swelling 06/09/2019  . Codeine Rash and Other (See Comments) 07/18/2011    Family History  Problem Relation Age of Onset  . Hypertension Mother   . Hypertension Father   . Stroke Father   . Sickle cell anemia Brother   . Diabetes Mother   . Sickle cell anemia Brother   . Diabetes type I Brother   . Kidney disease Brother     Social History   Socioeconomic History  . Marital status: Married    Spouse name: sylvia  . Number of children: 1  . Years of education: college  . Highest education level: Not on file  Occupational History  . Occupation: [employer]  . Occupation: disability  Tobacco Use  . Smoking status: Never Smoker  . Smokeless tobacco: Never Used  Vaping Use  . Vaping Use: Never used  Substance and Sexual Activity  . Alcohol use: Not Currently  . Drug use: Never  . Sexual activity: Yes    Partners: Female  Other Topics Concern  . Not on file  Social History Narrative   ** Merged History Encounter **       Pt lives in 2 story home with his wife and 1 son Has masters degree in psychology Currently disabled.     Social Determinants of Health   Financial Resource Strain: Low Risk   . Difficulty of Paying Living Expenses: Not hard at all  Food Insecurity: No Food Insecurity  . Worried About Charity fundraiser in the Last Year: Never true  . Ran Out of Food in the Last Year: Never true  Transportation Needs: No  Transportation Needs  . Lack of Transportation (Medical): No  . Lack of Transportation (Non-Medical): No  Physical Activity: Inactive  .  Days of Exercise per Week: 0 days  . Minutes of Exercise per Session: 0 min  Stress: No Stress Concern Present  . Feeling of Stress : Not at all  Social Connections:   . Frequency of Communication with Friends and Family:   . Frequency of Social Gatherings with Friends and Family:   . Attends Religious Services:   . Active Member of Clubs or Organizations:   . Attends Archivist Meetings:   [name] Marital Status:   Intimate Partner Violence: Not At Risk  . Fear of Current or Ex-Partner: No  . Emotionally Abused: No  . Physically Abused: No  . Sexually Abused: No    Review of Systems: Review of Systems  Constitutional: Negative for chills, fever and weight loss.  HENT: Negative for hearing loss and tinnitus.   Eyes: Negative for pain and redness.  Respiratory: Negative for cough and shortness of breath.   Cardiovascular: Negative for chest pain and palpitations.  Gastrointestinal: Positive for abdominal pain and blood in stool. Negative for constipation, diarrhea, heartburn, melena, nausea and vomiting.  Genitourinary: Negative for flank pain and hematuria.  Musculoskeletal: Negative for falls and joint pain.  Skin: Negative for itching and rash.  Neurological: Negative for dizziness and loss of consciousness.  Endo/Heme/Allergies: Negative for polydipsia. Does not bruise/bleed easily.  Psychiatric/Behavioral: Negative for substance abuse. The patient is not nervous/anxious.    Physical Exam: Vital signs: Vitals:   03/16/20 0733 03/16/20 0923  BP:  (!) 90/43  Pulse:  88  Resp:  18  Temp:  98 F (36.7 C)  SpO2: 99% 93%   Last BM Date: 03/14/20  Physical Exam Constitutional:      General: He is not in acute distress.    Appearance: Normal appearance.  HENT:     Head: Normocephalic and atraumatic.     Mouth/Throat:     Mouth:  Mucous membranes are moist.     Pharynx: Oropharynx is clear.  Eyes:     General: No scleral icterus.    Extraocular Movements: Extraocular movements intact.     Conjunctiva/sclera: Conjunctivae normal.  Cardiovascular:     Rate and Rhythm: Normal rate and regular rhythm.     Pulses: Normal pulses.  Pulmonary:     Effort: Pulmonary effort is normal. No respiratory distress.     Breath sounds: Normal breath sounds.  Abdominal:     General: There is no distension.     Palpations: There is no mass.     Tenderness: There is abdominal tenderness (LLQ, moderate). There is guarding. There is no rebound.  Musculoskeletal:        General: No swelling or tenderness.     Cervical back: Normal range of motion and neck supple.     Comments: SCDs in place  Skin:    General: Skin is warm and dry.  Neurological:     General: No focal deficit present.     Mental Status: He is alert and oriented to person, place, and time.  Psychiatric:        Mood and Affect: Mood normal.        Behavior: Behavior normal.      GI:  Lab Results: Recent Labs    03/15/20 1631 03/16/20 0436  WBC 15.8* 18.6*  HGB 12.3* 10.6*  HCT 33.7* 29.2*  PLT 283 262   BMET Recent Labs    03/15/20 1631 03/16/20 0436  NA 138 137  K 4.6 4.4  CL 92* 93*  CO2  29 26  GLUCOSE 237* 82  BUN 42* 50*  CREATININE 8.68* 10.15*  CALCIUM 9.2 8.8*   LFT Recent Labs    03/15/20 1631  PROT 7.1  ALBUMIN 4.1  AST 52*  ALT 28  ALKPHOS 129*  BILITOT 1.5*   PT/INR No results for input(s): LABPROT, INR in the last 72 hours.   Studies/Results: CT Abdomen Pelvis W Contrast  Result Date: 03/15/2020 CLINICAL DATA:  Rectal bleeding EXAM: CT ABDOMEN AND PELVIS WITH CONTRAST TECHNIQUE: Multidetector CT imaging of the abdomen and pelvis was performed using the standard protocol following bolus administration of intravenous contrast. CONTRAST:  153m OMNIPAQUE IOHEXOL 300 MG/ML  SOLN COMPARISON:  CT 08/09/2019 FINDINGS: Lower  chest: Atelectatic changes noted in the lung bases. Normal heart size. No pericardial effusion. Small subcutaneous nodule along the right anterior chest wall measuring up to 1.6 cm, unchanged from prior and likely reflective of small dermal inclusion cyst. Some posterior chest wall edematous changes and skin thickening are noted. Hepatobiliary: No focal liver abnormality is seen. Patient is post cholecystectomy. Slight prominence of the biliary tree likely related to reservoir effect. No calcified intraductal gallstones. Pancreas: No visible or concerning pancreatic lesion. Mild pancreatic atrophy particularly towards the head neck junction. No peripancreatic inflammation or ductal dilatation. Spleen: Diminutive spleen with peripheral calcifications similar to the comparison exam. No new splenic lesions. Adrenals/Urinary Tract: Mild thickening of the adrenal glands without discrete nodule. Bilateral renal atrophy and vascular calcifications with chronic mild bilateral perinephric stranding and symmetrically delayed nephrograms and excretion. Multiple bilateral fluid attenuation cysts are present. No obstructive urolithiasis or hydronephrosis is seen. Urinary bladder is decompressed at the time of imaging with some marked mural thickening and intramural fatty infiltration as well as mild perivesicular hazy stranding. Stomach/Bowel: Distal esophagus, stomach and duodenal sweep are unremarkable. No small bowel wall thickening or dilatation. No evidence of obstruction. Appendix is fluid-filled but normal caliber without focal periappendiceal stranding or inflammation to suggest acute appendicitis. Moderate proximal colonic stool burden without wall thickening or dilatation. Edematous thickening of the distal colon is seen from the splenic flexure through the descending and rectosigmoid colon. Surrounding pericolonic stranding is present. Much of the distal colon is decompressed or air-filled with a small amount of  liquid material within the rectal vault. No pneumatosis or air within the draining mesentery. No evidence of perforation or abscess. Vascular/Lymphatic: Atherosclerotic calcifications seen throughout the abdominal aorta and branch vessels including fairly significant plaque at the IMA origin which may result in at least some moderate to high-grade stenosis incompletely assessed on a non angiographic exam. No aneurysm or ectasia. Reproductive: The prostate and seminal vesicles are unremarkable. Other: Asymmetric atrophy of the right rectus sheath and postsurgical changes of the right upper quadrant mild abdominal wall laxity. No bowel containing hernia. No abdominopelvic free air or fluid. Musculoskeletal: Multilevel discogenic and facet degenerative changes throughout the spine. Multilevel flowing anterior osteophytosis of the lower thoracic spine, compatible with features of diffuse idiopathic skeletal hyperostosis (DISH) though there is ankylosis of the bilateral SI joints. Additional degenerative changes in the hips. No acute osseous abnormality or suspicious osseous lesion. IMPRESSION: 1. Edematous thickening of the distal colon from the splenic flexure through the descending and rectosigmoid colon with surrounding pericolonic stranding, consistent with colitis, which may be infectious, inflammatory or vascular in etiology particularly given some extensive plaque at the IMA origin and the distal colonic distribution. No evidence of perforation or abscess. 2. Urinary bladder is decompressed however there is marked mural  thickening and mildperivesicular hazy stranding. Bilateral renal atrophy and vascular calcifications with chronic mild bilateral perinephric stranding and symmetrically delayed nephrograms and excretion. Can be seen in the setting of chronic renal disease or infection. Correlate with urinalysis to exclude cystitis and ascending tract infection. 3. Aortic Atherosclerosis (ICD10-I70.0).  Electronically Signed   By: Lovena Le M.D.   On: 03/15/2020 21:56    Impression: Rectal bleeding and colitis on CT imaging, most compatible with ischemic colitis -CT 03/15/20: Edematous thickening of the distal colon from the splenic flexure through the descending and rectosigmoid colon with surrounding pericolonic stranding, consistent with colitis, which may be infectious, inflammatory or vascular in etiology particularly given some extensive plaque at the IMA origin and the distal colonic distribution. No evidence of perforation or abscess. -Hgb 10.6, decreased from 12.3 yesterday and 11.5 on 03/06/20. -WBCs elevated to 18.6 as compared to 15.8 yesterday, though patient has leukocytosis at baseline (WBCs 12.0 on 03/06/20 and 15.0 on 02/28/20)  ESRD on HD: BUN 50/ Cr 10.15  A fib, not currently on anticoagulation  Plan:  Continue Cipro/Flagyl.    Continue supportive care.  Avoid hypotension.  Continue to monitor H&H.  If bleeding persists, we will consider flexible sigmoidoscopy.  However, this is very compatible with ischemic colitis, so if bleeding continues to resolve, we will continue conservative management.  [facility] will follow.   LOS: 0 days   Salley Slaughter  PA-C 03/16/2020, 11:27 AM  Contact #  [phone]

## 2020-03-16 NOTE — Progress Notes (Signed)
Pt's MEWS is yellow, pt currently off the floor and in dialysis.

## 2020-03-16 NOTE — Plan of Care (Signed)
  Problem: Safety: Goal: Ability to remain free from injury will improve Outcome: Progressing   

## 2020-03-16 NOTE — Progress Notes (Signed)
Pt arrived back to the unit. Pt not in distress and tolerated well.

## 2020-03-16 NOTE — Procedures (Signed)
BP's low on HD, gave 500 cc back w/o effect, pt is tolerating well. Should improve when rinsed back in a few minutes here.   I was present at this dialysis session, have reviewed the session itself and made  appropriate changes Kelly Splinter MD [place] pager [phone]   03/16/2020, 4:23 PM

## 2020-03-16 NOTE — Progress Notes (Addendum)
Dr. Broadus John notified about pt's blood sugar 66.  Updated blood sugar 78 at 18:19.

## 2020-03-16 NOTE — Progress Notes (Addendum)
PROGRESS NOTE    Boody  ZOX:[id number] DOB: [birth year] DOA: 03/15/2020 PCP: [name], MD  Brief Narrative: Isaiah [name] is a 54 y.o. male with medical history significant for ESRD on MWF HD, multiple myeloma (currently on Velcade & daratumumab), history of subdural hematoma (with residual right hemiparesis, dysarthria), history of DVT/PE s/p IVC filter, history of CVA, paroxysmal atrial fibrillation not on anticoagulation, insulin-dependent type 2 diabetes, COPD, anemia of chronic disease, seizure disorder, and OSA not requiring CPAP who presented to the ED for evaluation of BRBPR. -Patient went to dialysis on 7/7 he had abdominal cramping and low blood pressure at the time, following this had 3 episodes of diarrhea followed by bright red blood per rectum on 7/8 morning onwards, he had 3-4 episodes of this with left-sided abdominal discomfort. -Denied any nausea or vomiting -Wife reported prior colonoscopy by Dr. Michail Sermon in 2018 which noted internal hemorrhoids and polyps   Assessment & Plan:   Left-sided colitis Bright red blood per rectum -Clinically suspect ischemic colitis in the setting of hypotension, dialysis etc. -Moderate leukocytosis, continue IV ciprofloxacin and Flagyl -Will request [facility] input -Start clear liquids, hold off on IV fluids as he is a dialysis patient -Supportive care, monitor hemoglobin  ESRD on hemodialysis Monday Wednesday Friday -Renal consulted, due for HD today  Chronic hypotension -On midodrine on dialysis days -Increase dose to 10 mg twice daily for now  Multiple myeloma Follows with oncology, Dr. Alen Blew.  Currently on treatment with Velcade and daratumumab.  Seizure disorder -Continue Lamictal and Vimpat  Type 2 diabetes mellitus -Continue Basaglar 10 units, sliding scale insulin  Paroxysmal atrial fibrillation -No longer on anticoagulation, given history of subdural hematoma -Not appropriate in this setting  regardless  Obstructive sleep apnea -Uses nocturnal O2 as needed  DVT prophylaxis: SCDs Code Status: Full code Family Communication: Discussed with patient in detail, no family at bedside Disposition Plan:  Status is: Observation  The patient will require care spanning > 2 midnights and should be moved to inpatient because: IV treatments appropriate due to intensity of illness or inability to take PO  Dispo: The patient is from: Home              Anticipated d/c is to: Home              Anticipated d/c date is: 2 days              Patient currently is not medically stable to d/c.  Consultants:   [facility]  Renal   Procedures:   Antimicrobials:    Subjective: -Has mild left-sided abdominal pain  Objective: Vitals:   03/15/20 2331 03/16/20 0413 03/16/20 0733 03/16/20 0923  BP: 114/71 (!) 85/47  (!) 90/43  Pulse: 92 74  88  Resp: 20 18  18   Temp: 98.7 F (37.1 C) 97.6 F (36.4 C)  98 F (36.7 C)  TempSrc:  Oral  Oral  SpO2: 100% 97% 99% 93%  Weight:      Height:        Intake/Output Summary (Last 24 hours) at 03/16/2020 1026 Last data filed at 03/16/2020 8119 Gross per 24 hour  Intake 200 ml  Output 0 ml  Net 200 ml   Filed Weights   03/15/20 1615 03/15/20 1619  Weight: 80.3 kg 80.3 kg    Examination:  General exam: Pleasant middle-aged male sitting up in bed, AAOx3, no distress Respiratory system: Clear to auscultation.  Cardiovascular system: S1 & S2  heard, RRR.   Gastrointestinal system: Soft, obese, mild left-sided tenderness, bowel sounds present Central nervous system: Alert and oriented. No focal neurological deficits. Extremities: No edema Skin: No rashes on exposed skin Psychiatry:  Mood & affect appropriate.     Data Reviewed:   CBC: Recent Labs  Lab 03/15/20 1631 03/16/20 0436  WBC 15.8* 18.6*  HGB 12.3* 10.6*  HCT 33.7* 29.2*  MCV 90.8 90.7  PLT 283 836   Basic Metabolic Panel: Recent Labs  Lab 03/15/20 1631  03/16/20 0436  NA 138 137  K 4.6 4.4  CL 92* 93*  CO2 29 26  GLUCOSE 237* 82  BUN 42* 50*  CREATININE 8.68* 10.15*  CALCIUM 9.2 8.8*   GFR: Estimated Creatinine Clearance: 8.4 mL/min (A) (by C-G formula based on SCr of 10.15 mg/dL (H)). Liver Function Tests: Recent Labs  Lab 03/15/20 1631  AST 52*  ALT 28  ALKPHOS 129*  BILITOT 1.5*  PROT 7.1  ALBUMIN 4.1   Recent Labs  Lab 03/15/20 1631  LIPASE 35   No results for input(s): AMMONIA in the last 168 hours. Coagulation Profile: No results for input(s): INR, PROTIME in the last 168 hours. Cardiac Enzymes: No results for input(s): CKTOTAL, CKMB, CKMBINDEX, TROPONINI in the last 168 hours. BNP (last 3 results) No results for input(s): PROBNP in the last 8760 hours. HbA1C: No results for input(s): HGBA1C in the last 72 hours. CBG: Recent Labs  Lab 03/15/20 2339 03/16/20 0412 03/16/20 0739  GLUCAP 105* 85 88   Lipid Profile: No results for input(s): CHOL, HDL, LDLCALC, TRIG, CHOLHDL, LDLDIRECT in the last 72 hours. Thyroid Function Tests: No results for input(s): TSH, T4TOTAL, FREET4, T3FREE, THYROIDAB in the last 72 hours. Anemia Panel: No results for input(s): VITAMINB12, FOLATE, FERRITIN, TIBC, IRON, RETICCTPCT in the last 72 hours. Urine analysis:    Component Value Date/Time   COLORURINE YELLOW 12/03/2015 [place] 12/03/2015 1746   LABSPEC 1.009 12/03/2015 1746   PHURINE 5.5 12/03/2015 1746   GLUCOSEU 100 (A) 12/03/2015 1746   HGBUR NEGATIVE 12/03/2015 1746   BILIRUBINUR NEGATIVE 12/03/2015 1746   KETONESUR NEGATIVE 12/03/2015 1746   PROTEINUR 100 (A) 12/03/2015 1746   UROBILINOGEN 0.2 10/01/2014 1124   NITRITE NEGATIVE 12/03/2015 1746   LEUKOCYTESUR NEGATIVE 12/03/2015 1746   Sepsis Labs: @LABRCNTIP (procalcitonin:4,lacticidven:4)  ) Recent Results (from the past 240 hour(s))  SARS Coronavirus 2 by RT PCR (hospital order, performed in [place] hospital lab) Nasopharyngeal  Nasopharyngeal Swab     Status: None   Collection Time: 03/15/20  8:19 PM   Specimen: Nasopharyngeal Swab  Result Value Ref Range Status   SARS Coronavirus 2 NEGATIVE NEGATIVE Final    Comment: (NOTE) SARS-CoV-2 target nucleic acids are NOT DETECTED.  The SARS-CoV-2 RNA is generally detectable in upper and lower respiratory specimens during the acute phase of infection. The lowest concentration of SARS-CoV-2 viral copies this assay can detect is 250 copies / mL. A negative result does not preclude SARS-CoV-2 infection and should not be used as the sole basis for treatment or other patient management decisions.  A negative result may occur with improper specimen collection / handling, submission of specimen other than nasopharyngeal swab, presence of viral mutation(s) within the areas targeted by this assay, and inadequate number of viral copies (<250 copies / mL). A negative result must be combined with clinical observations, patient history, and epidemiological information.  Fact Sheet for Patients:   StrictlyIdeas.no  Fact Sheet for Healthcare Providers:  BankingDealers.co.za  This test is not yet approved or  cleared by the [place] and has been authorized for detection and/or diagnosis of SARS-CoV-2 by FDA under an Emergency Use Authorization (EUA).  This EUA will remain in effect (meaning this test can be used) for the duration of the COVID-19 declaration under Section 564(b)(1) of the Act, 21 U.S.C. section 360bbb-3(b)(1), unless the authorization is terminated or revoked sooner.  Performed at [facility], [place] [address], [place], [place] [address]          Radiology Studies: CT Abdomen Pelvis W Contrast  Result Date: 03/15/2020 CLINICAL DATA:  Rectal bleeding EXAM: CT ABDOMEN AND PELVIS WITH CONTRAST TECHNIQUE: Multidetector CT imaging of the abdomen and pelvis was performed using the standard protocol  following bolus administration of intravenous contrast. CONTRAST:  125m OMNIPAQUE IOHEXOL 300 MG/ML  SOLN COMPARISON:  CT 08/09/2019 FINDINGS: Lower chest: Atelectatic changes noted in the lung bases. Normal heart size. No pericardial effusion. Small subcutaneous nodule along the right anterior chest wall measuring up to 1.6 cm, unchanged from prior and likely reflective of small dermal inclusion cyst. Some posterior chest wall edematous changes and skin thickening are noted. Hepatobiliary: No focal liver abnormality is seen. Patient is post cholecystectomy. Slight prominence of the biliary tree likely related to reservoir effect. No calcified intraductal gallstones. Pancreas: No visible or concerning pancreatic lesion. Mild pancreatic atrophy particularly towards the head neck junction. No peripancreatic inflammation or ductal dilatation. Spleen: Diminutive spleen with peripheral calcifications similar to the comparison exam. No new splenic lesions. Adrenals/Urinary Tract: Mild thickening of the adrenal glands without discrete nodule. Bilateral renal atrophy and vascular calcifications with chronic mild bilateral perinephric stranding and symmetrically delayed nephrograms and excretion. Multiple bilateral fluid attenuation cysts are present. No obstructive urolithiasis or hydronephrosis is seen. Urinary bladder is decompressed at the time of imaging with some marked mural thickening and intramural fatty infiltration as well as mild perivesicular hazy stranding. Stomach/Bowel: Distal esophagus, stomach and duodenal sweep are unremarkable. No small bowel wall thickening or dilatation. No evidence of obstruction. Appendix is fluid-filled but normal caliber without focal periappendiceal stranding or inflammation to suggest acute appendicitis. Moderate proximal colonic stool burden without wall thickening or dilatation. Edematous thickening of the distal colon is seen from the splenic flexure through the descending and  rectosigmoid colon. Surrounding pericolonic stranding is present. Much of the distal colon is decompressed or air-filled with a small amount of liquid material within the rectal vault. No pneumatosis or air within the draining mesentery. No evidence of perforation or abscess. Vascular/Lymphatic: Atherosclerotic calcifications seen throughout the abdominal aorta and branch vessels including fairly significant plaque at the IMA origin which may result in at least some moderate to high-grade stenosis incompletely assessed on a non angiographic exam. No aneurysm or ectasia. Reproductive: The prostate and seminal vesicles are unremarkable. Other: Asymmetric atrophy of the right rectus sheath and postsurgical changes of the right upper quadrant mild abdominal wall laxity. No bowel containing hernia. No abdominopelvic free air or fluid. Musculoskeletal: Multilevel discogenic and facet degenerative changes throughout the spine. Multilevel flowing anterior osteophytosis of the lower thoracic spine, compatible with features of diffuse idiopathic skeletal hyperostosis (DISH) though there is ankylosis of the bilateral SI joints. Additional degenerative changes in the hips. No acute osseous abnormality or suspicious osseous lesion. IMPRESSION: 1. Edematous thickening of the distal colon from the splenic flexure through the descending and rectosigmoid colon with surrounding pericolonic stranding, consistent with colitis, which may be infectious, inflammatory  or vascular in etiology particularly given some extensive plaque at the IMA origin and the distal colonic distribution. No evidence of perforation or abscess. 2. Urinary bladder is decompressed however there is marked mural thickening and mildperivesicular hazy stranding. Bilateral renal atrophy and vascular calcifications with chronic mild bilateral perinephric stranding and symmetrically delayed nephrograms and excretion. Can be seen in the setting of chronic renal disease  or infection. Correlate with urinalysis to exclude cystitis and ascending tract infection. 3. Aortic Atherosclerosis (ICD10-I70.0). Electronically Signed   By: Lovena Le M.D.   On: 03/15/2020 21:56        Scheduled Meds: . Chlorhexidine Gluconate Cloth  6 each Topical Q0600  . fluticasone furoate-vilanterol  1 puff Inhalation Daily  . insulin aspart  0-9 Units Subcutaneous TID WC  . insulin glargine  10 Units Subcutaneous Daily  . [START ON 03/17/2020] lacosamide  200 mg Oral 2 times per day on Sun Tue Thu Sat  . lacosamide  200 mg Oral 3 times per day on Mon Wed Fri  . lamoTRIgine  200 mg Oral BID  . midodrine  10 mg Oral BID WC  . multivitamin  1 tablet Oral QHS  . pantoprazole  40 mg Oral Daily  . sodium chloride flush  3 mL Intravenous Once   Continuous Infusions: . ciprofloxacin 400 mg (03/16/20 0030)  . metronidazole 500 mg (03/16/20 0621)     LOS: 0 days    Time spent: 109mn  PDomenic Polite MD [facility] 03/16/2020, 10:26 AM

## 2020-03-17 LAB — BASIC METABOLIC PANEL
Anion gap: 13 (ref 5–15)
BUN: 32 mg/dL — ABNORMAL HIGH (ref 6–20)
CO2: 23 mmol/L (ref 22–32)
Calcium: 8.4 mg/dL — ABNORMAL LOW (ref 8.9–10.3)
Chloride: 96 mmol/L — ABNORMAL LOW (ref 98–111)
Creatinine, Ser: 7.65 mg/dL — ABNORMAL HIGH (ref 0.61–1.24)
GFR calc Af Amer: 8 mL/min — ABNORMAL LOW (ref >60)
GFR calc non Af Amer: 7 mL/min — ABNORMAL LOW (ref >60)
Glucose, Bld: 122 mg/dL — ABNORMAL HIGH (ref 70–99)
Potassium: 4.3 mmol/L (ref 3.5–5.1)
Sodium: 132 mmol/L — ABNORMAL LOW (ref 135–145)

## 2020-03-17 LAB — CBC
HCT: 25.3 % — ABNORMAL LOW (ref 39.0–52.0)
Hemoglobin: 9.2 g/dL — ABNORMAL LOW (ref 13.0–17.0)
MCH: 32.5 pg (ref 26.0–34.0)
MCHC: 36.4 g/dL — ABNORMAL HIGH (ref 30.0–36.0)
MCV: 89.4 fL (ref 80.0–100.0)
Platelets: 271 10*3/uL (ref 150–400)
RBC: 2.83 MIL/uL — ABNORMAL LOW (ref 4.22–5.81)
RDW: 16.6 % — ABNORMAL HIGH (ref 11.5–15.5)
WBC: 14.4 10*3/uL — ABNORMAL HIGH (ref 4.0–10.5)
nRBC: 3.6 % — ABNORMAL HIGH (ref 0.0–0.2)

## 2020-03-17 LAB — MRSA PCR SCREENING: MRSA by PCR: NEGATIVE

## 2020-03-17 LAB — GLUCOSE, CAPILLARY
Glucose-Capillary: 100 mg/dL — ABNORMAL HIGH (ref 70–99)
Glucose-Capillary: 68 mg/dL — ABNORMAL LOW (ref 70–99)
Glucose-Capillary: 85 mg/dL (ref 70–99)
Glucose-Capillary: 119 mg/dL — ABNORMAL HIGH (ref 70–99)
Glucose-Capillary: 123 mg/dL — ABNORMAL HIGH (ref 70–99)
Glucose-Capillary: 145 mg/dL — ABNORMAL HIGH (ref 70–99)

## 2020-03-17 NOTE — Progress Notes (Signed)
[facility]astroenterology Progress Note  Isaiah [name] 54 y.o. 29-Jun-1966   Subjective: Feels good. Denies abdominal pain or rectal bleeding. Tolerating soft diet.  Objective: Vital signs: Vitals:   03/17/20 0507 03/17/20 0901  BP: (!) 81/44 (!) 102/45  Pulse: 61 64  Resp: 16 18  Temp: 98.7 F (37.1 C) 98.1 F (36.7 C)  SpO2: 96% 96%    Physical Exam: Gen: alert, no acute distress  HEENT: anicteric sclera CV: RRR Chest: CTA B Abd: minimal left-sided tenderness without guarding, soft, nondistended, +BS Ext: no edema  Lab Results: Recent Labs    03/16/20 0436 03/17/20 0424  NA 137 132*  K 4.4 4.3  CL 93* 96*  CO2 26 23  GLUCOSE 82 122*  BUN 50* 32*  CREATININE 10.15* 7.65*  CALCIUM 8.8* 8.4*   Recent Labs    03/15/20 1631  AST 52*  ALT 28  ALKPHOS 129*  BILITOT 1.5*  PROT 7.1  ALBUMIN 4.1   Recent Labs    03/16/20 0436 03/17/20 0424  WBC 18.6* 14.4*  HGB 10.6* 9.2*  HCT 29.2* 25.3*  MCV 90.7 89.4  PLT 262 271      Assessment/Plan: Resolving ischemic colitis - at d/c transition to PO Cipro/Flagyl to complete a 10 day course. F/U with me in 4-6 weeks (our office will arrange). D/W Dr. Broadus John. Updated wife by phone. Will sign off. Call if questions.   [name] 03/17/2020, 4:07 PM  Questions please call [phone] ID: [place], male   DOB: 06/25/1966, 54 y.o.   MRN: [id number]

## 2020-03-17 NOTE — Progress Notes (Signed)
Hypoglycemic Event  CBG: Results for Isaiah [name], Isaiah [name] (MRN [id number]) as of 03/17/2020 03:23  Ref. Range 03/17/2020 02:36  Glucose-Capillary Latest Ref Range: 70 - 99 mg/dL 68 (L)    Treatment: 4 oz juice/soda  Symptoms: None  Follow-up CBG: Time: CBG Result:Results for Isaiah [name], Isaiah [name] (MRN [id number]) as of 03/17/2020 03:27  Ref. Range 03/17/2020 03:11  Glucose-Capillary Latest Ref Range: 70 - 99 mg/dL 100 (H)    Possible Reasons for Event: Inadequate meal intake  Comments/MD notified:    Viviano Simas

## 2020-03-17 NOTE — Progress Notes (Signed)
[facility] Progress Note   Subjective:   Patient seen and examined at bedside.  Feeling well.  Only complaint is he is hungry.  Denies n/v/d, CP, SOB, weakness, dizziness and fatigue.    Objective Vitals:   03/16/20 1746 03/16/20 2109 03/17/20 0507 03/17/20 0901  BP: (!) 107/54 (!) 129/48 (!) 81/44 (!) 102/45  Pulse: 71 (!) 57 61 64  Resp: _0 Temp: 98.6 F (37 C) 98.4 F (36.9 C) 98.7 F (37.1 C) 98.1 F (36.7 C)  TempSrc: Oral Oral Oral Oral  SpO2: 96% 97% 96% 96%  Weight:  88.9 kg    Height:       Physical Exam General:pleasant, well appearing male in NAD Heart:RRR Lungs:CTAB Abdomen:soft, NTND Extremities:no LE edema Dialysis Access: RU AVF +b   Filed Weights   03/16/20 1236 03/16/20 1631 03/16/20 2109  Weight: 85.6 kg 84.4 kg 88.9 kg    Intake/Output Summary (Last 24 hours) at 03/17/2020 1152 Last data filed at 03/17/2020 0900 Gross per 24 hour  Intake 840.04 ml  Output -899 ml  Net 1739.04 ml    Additional Objective Labs: Basic Metabolic Panel: Recent Labs  Lab 03/15/20 1631 03/16/20 0436 03/17/20 0424  NA 138 137 132*  K 4.6 4.4 4.3  CL 92* 93* 96*  CO2 _1 GLUCOSE 237* 82 122*  BUN 42* 50* 32*  CREATININE 8.68* 10.15* 7.65*  CALCIUM 9.2 8.8* 8.4*   Liver Function Tests: Recent Labs  Lab 03/15/20 1631  AST 52*  ALT 28  ALKPHOS 129*  BILITOT 1.5*  PROT 7.1  ALBUMIN 4.1   Recent Labs  Lab 03/15/20 1631  LIPASE 35   CBC: Recent Labs  Lab 03/15/20 1631 03/16/20 0436 03/17/20 0424  WBC 15.8* 18.6* 14.4*  HGB 12.3* 10.6* 9.2*  HCT 33.7* 29.2* 25.3*  MCV 90.8 90.7 89.4  PLT 283 262 271    Recent Labs  Lab 03/16/20 2115 03/17/20 0236 03/17/20 0311 03/17/20 0632 03/17/20 1120  GLUCAP 149* 68* 100* 85 119*   Studies/Results: CT Abdomen Pelvis W Contrast  Result Date: 03/15/2020 CLINICAL DATA:  Rectal bleeding EXAM: CT ABDOMEN AND PELVIS WITH CONTRAST TECHNIQUE: Multidetector CT imaging of  the abdomen and pelvis was performed using the standard protocol following bolus administration of intravenous contrast. CONTRAST:  184m OMNIPAQUE IOHEXOL 300 MG/ML  SOLN COMPARISON:  CT 08/09/2019 FINDINGS: Lower chest: Atelectatic changes noted in the lung bases. Normal heart size. No pericardial effusion. Small subcutaneous nodule along the right anterior chest wall measuring up to 1.6 cm, unchanged from prior and likely reflective of small dermal inclusion cyst. Some posterior chest wall edematous changes and skin thickening are noted. Hepatobiliary: No focal liver abnormality is seen. Patient is post cholecystectomy. Slight prominence of the biliary tree likely related to reservoir effect. No calcified intraductal gallstones. Pancreas: No visible or concerning pancreatic lesion. Mild pancreatic atrophy particularly towards the head neck junction. No peripancreatic inflammation or ductal dilatation. Spleen: Diminutive spleen with peripheral calcifications similar to the comparison exam. No new splenic lesions. Adrenals/Urinary Tract: Mild thickening of the adrenal glands without discrete nodule. Bilateral renal atrophy and vascular calcifications with chronic mild bilateral perinephric stranding and symmetrically delayed nephrograms and excretion. Multiple bilateral fluid attenuation cysts are present. No obstructive urolithiasis or hydronephrosis is seen. Urinary bladder is decompressed at the time of imaging with some marked mural thickening and intramural fatty infiltration as well as mild perivesicular hazy stranding. Stomach/Bowel: Distal esophagus, stomach and duodenal  sweep are unremarkable. No small bowel wall thickening or dilatation. No evidence of obstruction. Appendix is fluid-filled but normal caliber without focal periappendiceal stranding or inflammation to suggest acute appendicitis. Moderate proximal colonic stool burden without wall thickening or dilatation. Edematous thickening of the distal  colon is seen from the splenic flexure through the descending and rectosigmoid colon. Surrounding pericolonic stranding is present. Much of the distal colon is decompressed or air-filled with a small amount of liquid material within the rectal vault. No pneumatosis or air within the draining mesentery. No evidence of perforation or abscess. Vascular/Lymphatic: Atherosclerotic calcifications seen throughout the abdominal aorta and branch vessels including fairly significant plaque at the IMA origin which may result in at least some moderate to high-grade stenosis incompletely assessed on a non angiographic exam. No aneurysm or ectasia. Reproductive: The prostate and seminal vesicles are unremarkable. Other: Asymmetric atrophy of the right rectus sheath and postsurgical changes of the right upper quadrant mild abdominal wall laxity. No bowel containing hernia. No abdominopelvic free air or fluid. Musculoskeletal: Multilevel discogenic and facet degenerative changes throughout the spine. Multilevel flowing anterior osteophytosis of the lower thoracic spine, compatible with features of diffuse idiopathic skeletal hyperostosis (DISH) though there is ankylosis of the bilateral SI joints. Additional degenerative changes in the hips. No acute osseous abnormality or suspicious osseous lesion. IMPRESSION: 1. Edematous thickening of the distal colon from the splenic flexure through the descending and rectosigmoid colon with surrounding pericolonic stranding, consistent with colitis, which may be infectious, inflammatory or vascular in etiology particularly given some extensive plaque at the IMA origin and the distal colonic distribution. No evidence of perforation or abscess. 2. Urinary bladder is decompressed however there is marked mural thickening and mildperivesicular hazy stranding. Bilateral renal atrophy and vascular calcifications with chronic mild bilateral perinephric stranding and symmetrically delayed nephrograms  and excretion. Can be seen in the setting of chronic renal disease or infection. Correlate with urinalysis to exclude cystitis and ascending tract infection. 3. Aortic Atherosclerosis (ICD10-I70.0). Electronically Signed   By: Price  DeHay M.D.   On: 03/15/2020 21:56    Medications: . ciprofloxacin 400 mg (03/16/20 2257)  . metronidazole Stopped (03/17/20 0634)   . calcitRIOL  1.5 mcg Oral Q M,W,F-HD  . Chlorhexidine Gluconate Cloth  6 each Topical Q0600  . ferric citrate  210 mg Oral TID WC  . fluticasone furoate-vilanterol  1 puff Inhalation Daily  . insulin aspart  0-9 Units Subcutaneous TID WC  . insulin glargine  10 Units Subcutaneous Daily  . lacosamide  200 mg Oral 2 times per day on Sun Tue Thu Sat  . lacosamide  200 mg Oral 3 times per day on Mon Wed Fri  . lamoTRIgine  200 mg Oral BID  . midodrine  10 mg Oral BID WC  . multivitamin  1 tablet Oral QHS  . pantoprazole  40 mg Oral Daily  . sodium chloride flush  3 mL Intravenous Once    Dialysis Orders: MWF - [facility]  4hrs, BFR 400, DFR 800,  EDW 83kg, 2K/ 2Ca  Access: RU AVF  Heparin none Mircera 30 mcg q2wks - last 03/14/20 Calcitriol 1.5 mcg PO qHD  Assessment/Plan: 1.  Colitis with BRBPR - identified on CT, etiology unclear.  FOBT+.  Hgb 9.2 today. On ABX.  Tolerating clear liquid diet. Per primary. 2.  ESRD -  On HD MWF.  HD yesterday tolerated well.   3.  Hypotension/volume  - Hypotension, especially with HD requiring fluid bolus 400mL today.    On midodrine.  Does not appear volume overloaded.  Running even today.  UF as tolerated. Meeting dry as OP.  4.  Anemia of CKD - Hgb [id number].  Slowly trending down over last month from 11.5>10.5.  ESA just restarted and recently dosed.  Follow trends.  5.  Secondary Hyperparathyroidism -  Ca and phos at goal.  Continue binders/VDRA once eating. 6.  Nutrition - Clear liquid diet currently, Renal diet w/fluid restrictions once advanced.  7. Seizures - per primary 8. Hx CVA  9. OSA  on CPAP 10. Hx PE/DVT - s/p IVC filter 11. A fib 12. Multiple myeloma currently on Velcade and daratumumab 13. Chronic SDH  Jen Mow, PA-C [facility] Pager: [phone] 03/17/2020,11:52 AM  LOS: 1 day

## 2020-03-17 NOTE — Progress Notes (Signed)
PROGRESS NOTE    [place]  WCH:[id number] DOB: Feb 02, 1966 DOA: 03/15/2020 PCP: [name], MD  Brief Narrative: Isaiah [name] is a 54 y.o. male with medical history significant for ESRD on MWF HD, multiple myeloma (currently on Velcade & daratumumab), history of subdural hematoma (with residual right hemiparesis, dysarthria), history of DVT/PE s/p IVC filter, history of CVA, paroxysmal atrial fibrillation not on anticoagulation, insulin-dependent type 2 diabetes, COPD, anemia of chronic disease, seizure disorder, and OSA not requiring CPAP who presented to the ED for evaluation of BRBPR. -Patient went to dialysis on 7/7 he had abdominal cramping and low blood pressure at the time, following this had 3 episodes of diarrhea followed by bright red blood per rectum on 7/8 morning onwards, he had 3-4 episodes of this with left-sided abdominal discomfort. -Denied any nausea or vomiting -Wife reported prior colonoscopy by Dr. Michail Sermon in 2018 which noted internal hemorrhoids and polyps   Assessment & Plan:   Ischemic colitis Bright red blood per rectum -Left-sided colitis noted on CT, clinically suspect ischemic colitis in the setting of hypotension, dialysis etc. -Moderate leukocytosis, continue IV ciprofloxacin and Flagyl day 2 -Appreciate [facility] input -Clinically improving, advance diet today, increase midodrine dose -Supportive care, monitor hemoglobin -Ambulate, out of bed  ESRD on hemodialysis Monday Wednesday Friday -Renal consulted, underwent dialysis yesterday  Chronic hypotension -On midodrine on dialysis days -Increased dose to 10 mg twice daily for now  Multiple myeloma Follows with oncology, Dr. Alen Blew.  Currently on treatment with Velcade and daratumumab.  Seizure disorder -Continue Lamictal and Vimpat  Type 2 diabetes mellitus -Continue Basaglar 10 units, sliding scale insulin  Paroxysmal atrial fibrillation -No longer on anticoagulation, given history of  subdural hematoma -Not appropriate in this setting regardless  Obstructive sleep apnea -Uses nocturnal O2 as needed  DVT prophylaxis: SCDs Code Status: Full code Family Communication: Called and updated spouse Disposition Plan:  Status is: Observation  The patient will require care spanning > 2 midnights and should be moved to inpatient because: IV treatments appropriate due to intensity of illness or inability to take PO  Dispo: The patient is from: Home              Anticipated d/c is to: Home              Anticipated d/c date is: 1-2 days              Patient currently is not medically stable to d/c.  Consultants:   [facility]  Renal   Procedures:   Antimicrobials:    Subjective: -Feels better, abdominal pain is improving, no bleeding overnight  Objective: Vitals:   03/16/20 1746 03/16/20 2109 03/17/20 0507 03/17/20 0901  BP: (!) 107/54 (!) 129/48 (!) 81/44 (!) 102/45  Pulse: 71 (!) 57 61 64  Resp: 16 14 16 18   Temp: 98.6 F (37 C) 98.4 F (36.9 C) 98.7 F (37.1 C) 98.1 F (36.7 C)  TempSrc: Oral Oral Oral Oral  SpO2: 96% 97% 96% 96%  Weight:  88.9 kg    Height:        Intake/Output Summary (Last 24 hours) at 03/17/2020 1349 Last data filed at 03/17/2020 1223 Gross per 24 hour  Intake 960.04 ml  Output -899 ml  Net 1859.04 ml   Filed Weights   03/16/20 1236 03/16/20 1631 03/16/20 2109  Weight: 85.6 kg 84.4 kg 88.9 kg    Examination:  General exam: Chronically ill pleasant male sitting up in bed,  AAO x3, no distress CVS: S1-S2, regular rate rhythm Lungs: Decreased breath sounds bases Abdomen: Soft, nontender, bowel sounds present Extremities: No edema  Skin: No rashes on exposed skin Psychiatry:  Mood & affect appropriate.     Data Reviewed:   CBC: Recent Labs  Lab 03/15/20 1631 03/16/20 0436 03/17/20 0424  WBC 15.8* 18.6* 14.4*  HGB 12.3* 10.6* 9.2*  HCT 33.7* 29.2* 25.3*  MCV 90.8 90.7 89.4  PLT 283 262 841   Basic  Metabolic Panel: Recent Labs  Lab 03/15/20 1631 03/16/20 0436 03/17/20 0424  NA 138 137 132*  K 4.6 4.4 4.3  CL 92* 93* 96*  CO2 29 26 23   GLUCOSE 237* 82 122*  BUN 42* 50* 32*  CREATININE 8.68* 10.15* 7.65*  CALCIUM 9.2 8.8* 8.4*   GFR: Estimated Creatinine Clearance: 12.1 mL/min (A) (by C-G formula based on SCr of 7.65 mg/dL (H)). Liver Function Tests: Recent Labs  Lab 03/15/20 1631  AST 52*  ALT 28  ALKPHOS 129*  BILITOT 1.5*  PROT 7.1  ALBUMIN 4.1   Recent Labs  Lab 03/15/20 1631  LIPASE 35   No results for input(s): AMMONIA in the last 168 hours. Coagulation Profile: No results for input(s): INR, PROTIME in the last 168 hours. Cardiac Enzymes: No results for input(s): CKTOTAL, CKMB, CKMBINDEX, TROPONINI in the last 168 hours. BNP (last 3 results) No results for input(s): PROBNP in the last 8760 hours. HbA1C: Recent Labs    03/16/20 0436  HGBA1C 5.5   CBG: Recent Labs  Lab 03/16/20 2115 03/17/20 0236 03/17/20 0311 03/17/20 0632 03/17/20 1120  GLUCAP 149* 68* 100* 85 119*   Lipid Profile: No results for input(s): CHOL, HDL, LDLCALC, TRIG, CHOLHDL, LDLDIRECT in the last 72 hours. Thyroid Function Tests: No results for input(s): TSH, T4TOTAL, FREET4, T3FREE, THYROIDAB in the last 72 hours. Anemia Panel: No results for input(s): VITAMINB12, FOLATE, FERRITIN, TIBC, IRON, RETICCTPCT in the last 72 hours. Urine analysis:    Component Value Date/Time   COLORURINE YELLOW 12/03/2015 [place] 12/03/2015 1746   LABSPEC 1.009 12/03/2015 1746   PHURINE 5.5 12/03/2015 1746   GLUCOSEU 100 (A) 12/03/2015 1746   HGBUR NEGATIVE 12/03/2015 1746   BILIRUBINUR NEGATIVE 12/03/2015 1746   KETONESUR NEGATIVE 12/03/2015 1746   PROTEINUR 100 (A) 12/03/2015 1746   UROBILINOGEN 0.2 10/01/2014 1124   NITRITE NEGATIVE 12/03/2015 1746   LEUKOCYTESUR NEGATIVE 12/03/2015 1746   Sepsis Labs: @LABRCNTIP (procalcitonin:4,lacticidven:4)  ) Recent  Results (from the past 240 hour(s))  SARS Coronavirus 2 by RT PCR (hospital order, performed in [facility] hospital lab) Nasopharyngeal Nasopharyngeal Swab     Status: None   Collection Time: 03/15/20  8:19 PM   Specimen: Nasopharyngeal Swab  Result Value Ref Range Status   SARS Coronavirus 2 NEGATIVE NEGATIVE Final    Comment: (NOTE) SARS-CoV-2 target nucleic acids are NOT DETECTED.  The SARS-CoV-2 RNA is generally detectable in upper and lower respiratory specimens during the acute phase of infection. The lowest concentration of SARS-CoV-2 viral copies this assay can detect is 250 copies / mL. A negative result does not preclude SARS-CoV-2 infection and should not be used as the sole basis for treatment or other patient management decisions.  A negative result may occur with improper specimen collection / handling, submission of specimen other than nasopharyngeal swab, presence of viral mutation(s) within the areas targeted by this assay, and inadequate number of viral copies (<250 copies / mL). A negative result must be combined  with clinical observations, patient history, and epidemiological information.  Fact Sheet for Patients:   StrictlyIdeas.no  Fact Sheet for Healthcare Providers: BankingDealers.co.za  This test is not yet approved or  cleared by the [place] FDA and has been authorized for detection and/or diagnosis of SARS-CoV-2 by FDA under an Emergency Use Authorization (EUA).  This EUA will remain in effect (meaning this test can be used) for the duration of the COVID-19 declaration under Section 564(b)(1) of the Act, 21 U.S.C. section 360bbb-3(b)(1), unless the authorization is terminated or revoked sooner.  Performed at [facility], [place] [address], [place], [place] [address]   MRSA PCR Screening     Status: None   Collection Time: 03/17/20  5:40 AM   Specimen: Nasal Mucosa; Nasopharyngeal  Result Value  Ref Range Status   MRSA by PCR NEGATIVE NEGATIVE Final    Comment:        The GeneXpert MRSA Assay (FDA approved for NASAL specimens only), is one component of a comprehensive MRSA colonization surveillance program. It is not intended to diagnose MRSA infection nor to guide or monitor treatment for MRSA infections. Performed at [facility], [place] [address], [place],  [place] [address]          Radiology Studies: CT Abdomen Pelvis W Contrast  Result Date: 03/15/2020 CLINICAL DATA:  Rectal bleeding EXAM: CT ABDOMEN AND PELVIS WITH CONTRAST TECHNIQUE: Multidetector CT imaging of the abdomen and pelvis was performed using the standard protocol following bolus administration of intravenous contrast. CONTRAST:  172m OMNIPAQUE IOHEXOL 300 MG/ML  SOLN COMPARISON:  CT 08/09/2019 FINDINGS: Lower chest: Atelectatic changes noted in the lung bases. Normal heart size. No pericardial effusion. Small subcutaneous nodule along the right anterior chest wall measuring up to 1.6 cm, unchanged from prior and likely reflective of small dermal inclusion cyst. Some posterior chest wall edematous changes and skin thickening are noted. Hepatobiliary: No focal liver abnormality is seen. Patient is post cholecystectomy. Slight prominence of the biliary tree likely related to reservoir effect. No calcified intraductal gallstones. Pancreas: No visible or concerning pancreatic lesion. Mild pancreatic atrophy particularly towards the head neck junction. No peripancreatic inflammation or ductal dilatation. Spleen: Diminutive spleen with peripheral calcifications similar to the comparison exam. No new splenic lesions. Adrenals/Urinary Tract: Mild thickening of the adrenal glands without discrete nodule. Bilateral renal atrophy and vascular calcifications with chronic mild bilateral perinephric stranding and symmetrically delayed nephrograms and excretion. Multiple bilateral fluid attenuation cysts are present. No  obstructive urolithiasis or hydronephrosis is seen. Urinary bladder is decompressed at the time of imaging with some marked mural thickening and intramural fatty infiltration as well as mild perivesicular hazy stranding. Stomach/Bowel: Distal esophagus, stomach and duodenal sweep are unremarkable. No small bowel wall thickening or dilatation. No evidence of obstruction. Appendix is fluid-filled but normal caliber without focal periappendiceal stranding or inflammation to suggest acute appendicitis. Moderate proximal colonic stool burden without wall thickening or dilatation. Edematous thickening of the distal colon is seen from the splenic flexure through the descending and rectosigmoid colon. Surrounding pericolonic stranding is present. Much of the distal colon is decompressed or air-filled with a small amount of liquid material within the rectal vault. No pneumatosis or air within the draining mesentery. No evidence of perforation or abscess. Vascular/Lymphatic: Atherosclerotic calcifications seen throughout the abdominal aorta and branch vessels including fairly significant plaque at the IMA origin which may result in at least some moderate to high-grade stenosis incompletely assessed on a non angiographic exam. No  aneurysm or ectasia. Reproductive: The prostate and seminal vesicles are unremarkable. Other: Asymmetric atrophy of the right rectus sheath and postsurgical changes of the right upper quadrant mild abdominal wall laxity. No bowel containing hernia. No abdominopelvic free air or fluid. Musculoskeletal: Multilevel discogenic and facet degenerative changes throughout the spine. Multilevel flowing anterior osteophytosis of the lower thoracic spine, compatible with features of diffuse idiopathic skeletal hyperostosis (DISH) though there is ankylosis of the bilateral SI joints. Additional degenerative changes in the hips. No acute osseous abnormality or suspicious osseous lesion. IMPRESSION: 1. Edematous  thickening of the distal colon from the splenic flexure through the descending and rectosigmoid colon with surrounding pericolonic stranding, consistent with colitis, which may be infectious, inflammatory or vascular in etiology particularly given some extensive plaque at the IMA origin and the distal colonic distribution. No evidence of perforation or abscess. 2. Urinary bladder is decompressed however there is marked mural thickening and mildperivesicular hazy stranding. Bilateral renal atrophy and vascular calcifications with chronic mild bilateral perinephric stranding and symmetrically delayed nephrograms and excretion. Can be seen in the setting of chronic renal disease or infection. Correlate with urinalysis to exclude cystitis and ascending tract infection. 3. Aortic Atherosclerosis (ICD10-I70.0). Electronically Signed   By: Lovena Le M.D.   On: 03/15/2020 21:56        Scheduled Meds: . calcitRIOL  1.5 mcg Oral Q M,W,F-HD  . Chlorhexidine Gluconate Cloth  6 each Topical Q0600  . ferric citrate  210 mg Oral TID WC  . fluticasone furoate-vilanterol  1 puff Inhalation Daily  . insulin aspart  0-9 Units Subcutaneous TID WC  . insulin glargine  10 Units Subcutaneous Daily  . lacosamide  200 mg Oral 2 times per day on Sun Tue Thu Sat  . lacosamide  200 mg Oral 3 times per day on Mon Wed Fri  . lamoTRIgine  200 mg Oral BID  . midodrine  10 mg Oral BID WC  . multivitamin  1 tablet Oral QHS  . pantoprazole  40 mg Oral Daily  . sodium chloride flush  3 mL Intravenous Once   Continuous Infusions: . ciprofloxacin 400 mg (03/16/20 2257)  . metronidazole 500 mg (03/17/20 1338)     LOS: 1 day    Time spent: 78mn  PDomenic Polite MD [facility] 03/17/2020, 1:49 PM

## 2020-03-17 NOTE — Plan of Care (Signed)
  Problem: Education: Goal: Knowledge of General Education information will improve Description: Including pain rating scale, medication(s)/side effects and non-pharmacologic comfort measures Outcome: Progressing   Problem: Nutrition: Goal: Adequate nutrition will be maintained Outcome: Progressing   Problem: Nutritional: Goal: Ability to make healthy dietary choices will improve Outcome: Progressing

## 2020-03-18 LAB — BASIC METABOLIC PANEL
Anion gap: 15 (ref 5–15)
BUN: 44 mg/dL — ABNORMAL HIGH (ref 6–20)
CO2: 22 mmol/L (ref 22–32)
Calcium: 8.4 mg/dL — ABNORMAL LOW (ref 8.9–10.3)
Chloride: 94 mmol/L — ABNORMAL LOW (ref 98–111)
Creatinine, Ser: 9.93 mg/dL — ABNORMAL HIGH (ref 0.61–1.24)
GFR calc Af Amer: 6 mL/min — ABNORMAL LOW (ref >60)
GFR calc non Af Amer: 5 mL/min — ABNORMAL LOW (ref >60)
Glucose, Bld: 84 mg/dL (ref 70–99)
Potassium: 4.2 mmol/L (ref 3.5–5.1)
Sodium: 131 mmol/L — ABNORMAL LOW (ref 135–145)

## 2020-03-18 LAB — CBC
HCT: 24.8 % — ABNORMAL LOW (ref 39.0–52.0)
Hemoglobin: 9.3 g/dL — ABNORMAL LOW (ref 13.0–17.0)
MCH: 33.2 pg (ref 26.0–34.0)
MCHC: 37.5 g/dL — ABNORMAL HIGH (ref 30.0–36.0)
MCV: 88.6 fL (ref 80.0–100.0)
Platelets: 338 10*3/uL (ref 150–400)
RBC: 2.8 MIL/uL — ABNORMAL LOW (ref 4.22–5.81)
RDW: 16.3 % — ABNORMAL HIGH (ref 11.5–15.5)
WBC: 11.5 10*3/uL — ABNORMAL HIGH (ref 4.0–10.5)
nRBC: 3.6 % — ABNORMAL HIGH (ref 0.0–0.2)

## 2020-03-18 LAB — GLUCOSE, CAPILLARY
Glucose-Capillary: 77 mg/dL (ref 70–99)
Glucose-Capillary: 148 mg/dL — ABNORMAL HIGH (ref 70–99)
Glucose-Capillary: 101 mg/dL — ABNORMAL HIGH (ref 70–99)
Glucose-Capillary: 111 mg/dL — ABNORMAL HIGH (ref 70–99)

## 2020-03-18 MED ORDER — METRONIDAZOLE 500 MG PO TABS
500.0000 mg | ORAL_TABLET | Freq: Three times a day (TID) | ORAL | Status: DC
  Administered 2020-03-18 – 2020-03-19 (×3): 500 mg via ORAL
  Filled 2020-03-18 (×3): qty 1

## 2020-03-18 MED ORDER — CHLORHEXIDINE GLUCONATE CLOTH 2 % EX PADS: 6.0000 | MEDICATED_PAD | Freq: Every day | CUTANEOUS | Status: DC

## 2020-03-18 MED ORDER — CIPROFLOXACIN HCL 500 MG PO TABS
500.0000 mg | ORAL_TABLET | Freq: Every day | ORAL | Status: DC
  Administered 2020-03-19 – 2020-03-20 (×2): 500 mg via ORAL
  Filled 2020-03-18 (×2): qty 1

## 2020-03-18 NOTE — Progress Notes (Signed)
Isaiah [name] is known to me with history of multiple myeloma currently receiving salvage therapy with Velcade and daratumumab.  He was hospitalized with colitis related to hypotension and ischemic causes.  I agree with the current management approach and systemic chemotherapy will be on hold till he is recovered at this time.  We will continue to follow his progress while he is hospitalized and arrange follow-up upon discharge.

## 2020-03-18 NOTE — Plan of Care (Signed)
  Problem: Elimination: Goal: Will not experience complications related to bowel motility Outcome: Progressing   Problem: Fluid Volume: Goal: Compliance with measures to maintain balanced fluid volume will improve Outcome: Progressing

## 2020-03-18 NOTE — Progress Notes (Signed)
[facility] Progress Note   Subjective:   Patient seen and examined at bedside.  Tolerated breakfast this AM.  No specific complaints.  Denies CP, SOB, n/v/d, abdominal pain, weakness, dizziness, fatigue and rectal bleeding.   Objective Vitals:   03/17/20 2215 03/18/20 0447 03/18/20 0821 03/18/20 0900  BP: (!) 89/42 (!) 96/40  (!) 101/46  Pulse: (!) 54 60  61  Resp: _0 Temp: 98 F (36.7 C) 98.1 F (36.7 C)  98.2 F (36.8 C)  TempSrc: Oral Oral  Oral  SpO2: 98% 98% 94% 96%  Weight:      Height:       Physical Exam General:WDWN, well appearing male in NAD Heart:RRR Lungs:CTAB Abdomen:soft, NTND Extremities:no LE edema Dialysis Access: RU AVF +b   Filed Weights   03/16/20 1631 03/16/20 2109 03/17/20 2214  Weight: 84.4 kg 88.9 kg 90.9 kg    Intake/Output Summary (Last 24 hours) at 03/18/2020 1216 Last data filed at 03/18/2020 0900 Gross per 24 hour  Intake 1280 ml  Output 0 ml  Net 1280 ml    Additional Objective Labs: Basic Metabolic Panel: Recent Labs  Lab 03/16/20 0436 03/17/20 0424 03/18/20 0626  NA 137 132* 131*  K 4.4 4.3 4.2  CL 93* 96* 94*  CO2 _1 GLUCOSE 82 122* 84  BUN 50* 32* 44*  CREATININE 10.15* 7.65* 9.93*  CALCIUM 8.8* 8.4* 8.4*   Liver Function Tests: Recent Labs  Lab 03/15/20 1631  AST 52*  ALT 28  ALKPHOS 129*  BILITOT 1.5*  PROT 7.1  ALBUMIN 4.1   Recent Labs  Lab 03/15/20 1631  LIPASE 35   CBC: Recent Labs  Lab 03/15/20 1631 03/15/20 1631 03/16/20 0436 03/17/20 0424 03/18/20 0626  WBC 15.8*   < > 18.6* 14.4* 11.5*  HGB 12.3*   < > 10.6* 9.2* 9.3*  HCT 33.7*   < > 29.2* 25.3* 24.8*  MCV 90.8  --  90.7 89.4 88.6  PLT 283   < > 262 271 338   < > = values in this interval not displayed.   CBG: Recent Labs  Lab 03/17/20 1120 03/17/20 1612 03/17/20 2212 03/18/20 0630 03/18/20 1131  GLUCAP 119* 123* 145* 77 148*    Medications:  . calcitRIOL  1.5 mcg Oral Q M,W,F-HD  .  Chlorhexidine Gluconate Cloth  6 each Topical Q0600  . [START ON 03/19/2020] ciprofloxacin  500 mg Oral Q breakfast  . ferric citrate  210 mg Oral TID WC  . fluticasone furoate-vilanterol  1 puff Inhalation Daily  . insulin aspart  0-9 Units Subcutaneous TID WC  . insulin glargine  10 Units Subcutaneous Daily  . lacosamide  200 mg Oral 2 times per day on Sun Tue Thu Sat  . lacosamide  200 mg Oral 3 times per day on Mon Wed Fri  . lamoTRIgine  200 mg Oral BID  . metroNIDAZOLE  500 mg Oral Q8H  . midodrine  10 mg Oral BID WC  . multivitamin  1 tablet Oral QHS  . pantoprazole  40 mg Oral Daily  . sodium chloride flush  3 mL Intravenous Once    Dialysis Orders: MWF -[facility] 4hrs, BFR400, [id number], EDW 83kg,2K/2Ca  Access:RU AVF Heparinnone Mircera51mg q2wks - last 03/14/20 Calcitriol1.561m PO qHD  Assessment/Plan: 1. Colitis with BRBPR- identified on CT, etiology unclear. FOBT+. Hgb 9.2>9.3 today. On ABX.  Tolerating renal/carb modified diet. Per primary.  GI signed off.  2. ESRD- On  HD MWF. Next HD tomorrow.  His chair time is 10:30 AM. If he is going to be d/c can return to OP dialysis at that time.  Will write orders for 2nd shift in case he remains inpatient.  3. Hypotension/volume- chronic hypotension. On midodrine. Does not appear volume overloaded. UF to dry as tolerated. 4. Anemiaof CKD- Hgb stable at 9.3.Slowly trending down over last month from 11.5>10.5. ESA just restarted and recently dosed. Follow trends.  5. Secondary Hyperparathyroidism -Ca and phos at goal. Continue binders/VDRA. 6. Nutrition- Renal/carb modified diet w/fluid restrictions.  7. Seizures - per primary 8. Hx CVA 9. OSA on CPAP 10. Hx PE/DVT-s/p IVC filter 11. A fib 12. Multiple myelomacurrently on Velcade and daratumumab 13. Chronic SDH  Jen Mow, PA-C [facility] Pager: [phone] 03/18/2020,12:16 PM  LOS: 2 days

## 2020-03-18 NOTE — Progress Notes (Signed)
PROGRESS NOTE    [place]  GSU:[id number] DOB: 1966/08/26 DOA: 03/15/2020 PCP: [name], MD  Brief Narrative: Isaiah [name] is a 54 y.o. male with medical history significant for ESRD on MWF HD, multiple myeloma (currently on Velcade & daratumumab), history of subdural hematoma (with residual right hemiparesis, dysarthria), history of DVT/PE s/p IVC filter, history of CVA, paroxysmal atrial fibrillation not on anticoagulation, insulin-dependent type 2 diabetes, COPD, anemia of chronic disease, seizure disorder, and OSA not requiring CPAP who presented to the ED for evaluation of BRBPR. -Patient went to dialysis on 7/7 he had abdominal cramping and low blood pressure at the time, following this had 3 episodes of diarrhea followed by bright red blood per rectum on 7/8 morning onwards, he had 3-4 episodes of this with left-sided abdominal discomfort. -Denied any nausea or vomiting -Wife reported prior colonoscopy by Dr. Michail Sermon in 2018 which noted internal hemorrhoids and polyps   Assessment & Plan:   Ischemic colitis Bright red blood per rectum -Left-sided colitis noted on CT, clinically suspect ischemic colitis in the setting of hypotension, dialysis etc. -Continue ciprofloxacin and Flagyl day 3, changed to p.o. -Appreciate [facility] input -Advanced diet, increased midodrine dose -Supportive care, monitor hemoglobin -Discharge planning  ESRD on hemodialysis Monday Wednesday Friday -Renal consulted, dialysis tomorrow  Chronic hypotension -On midodrine on dialysis days -Increased dose to 10 mg twice daily for now  Multiple myeloma Followed by Dr. Alen Blew.  Currently on treatment with Velcade and daratumumab.  Seizure disorder -Continue Lamictal and Vimpat  Type 2 diabetes mellitus -Continue Basaglar 10 units, sliding scale insulin  Paroxysmal atrial fibrillation -No longer on anticoagulation, given history of subdural hematoma -Not appropriate in this setting  regardless  Obstructive sleep apnea -Uses nocturnal O2 as needed  DVT prophylaxis: SCDs Code Status: Full code Family Communication: Called and updated spouse Disposition Plan:  Status is: Inpatient  Inpatient due to severity of illness  Dispo: The patient is from: Home              Anticipated d/c is to: Home              Anticipated d/c date is: 7/12              Patient currently is not medically stable to d/c.  Consultants:   [facility]  Renal   Procedures:   Antimicrobials:    Subjective: -Feeling well, abdominal pain has pretty much gone, tolerating liquids  Objective: Vitals:   03/17/20 2215 03/18/20 0447 03/18/20 0821 03/18/20 0900  BP: (!) 89/42 (!) 96/40  (!) 101/46  Pulse: (!) 54 60  61  Resp: _0 Temp: 98 F (36.7 C) 98.1 F (36.7 C)  98.2 F (36.8 C)  TempSrc: Oral Oral  Oral  SpO2: 98% 98% 94% 96%  Weight:      Height:        Intake/Output Summary (Last 24 hours) at 03/18/2020 1342 Last data filed at 03/18/2020 1200 Gross per 24 hour  Intake 1380 ml  Output 0 ml  Net 1380 ml   Filed Weights   03/16/20 1631 03/16/20 2109 03/17/20 2214  Weight: 84.4 kg 88.9 kg 90.9 kg    Examination:  General exam: Chronically ill male sitting up in bed, AAOx3, no distress CVS: S1-S2, regular rate rhythm Lungs: Decreased breath sounds the bases Abdomen: Soft, nontender, bowel sounds present Extremities: No edema Skin: No rashes on exposed skin Psychiatry:  Mood & affect appropriate.  Data Reviewed:   CBC: Recent Labs  Lab 03/15/20 1631 03/16/20 0436 03/17/20 0424 03/18/20 0626  WBC 15.8* 18.6* 14.4* 11.5*  HGB 12.3* 10.6* 9.2* 9.3*  HCT 33.7* 29.2* 25.3* 24.8*  MCV 90.8 90.7 89.4 88.6  PLT 283 262 271 450   Basic Metabolic Panel: Recent Labs  Lab 03/15/20 1631 03/16/20 0436 03/17/20 0424 03/18/20 0626  NA 138 137 132* 131*  K 4.6 4.4 4.3 4.2  CL 92* 93* 96* 94*  CO2 _0 GLUCOSE 237* 82 122* 84   BUN 42* 50* 32* 44*  CREATININE 8.68* 10.15* 7.65* 9.93*  CALCIUM 9.2 8.8* 8.4* 8.4*   GFR: Estimated Creatinine Clearance: 9.4 mL/min (A) (by C-G formula based on SCr of 9.93 mg/dL (H)). Liver Function Tests: Recent Labs  Lab 03/15/20 1631  AST 52*  ALT 28  ALKPHOS 129*  BILITOT 1.5*  PROT 7.1  ALBUMIN 4.1   Recent Labs  Lab 03/15/20 1631  LIPASE 35   No results for input(s): AMMONIA in the last 168 hours. Coagulation Profile: No results for input(s): INR, PROTIME in the last 168 hours. Cardiac Enzymes: No results for input(s): CKTOTAL, CKMB, CKMBINDEX, TROPONINI in the last 168 hours. BNP (last 3 results) No results for input(s): PROBNP in the last 8760 hours. HbA1C: Recent Labs    03/16/20 0436  HGBA1C 5.5   CBG: Recent Labs  Lab 03/17/20 1120 03/17/20 1612 03/17/20 2212 03/18/20 0630 03/18/20 1131  GLUCAP 119* 123* 145* 77 148*   Lipid Profile: No results for input(s): CHOL, HDL, LDLCALC, TRIG, CHOLHDL, LDLDIRECT in the last 72 hours. Thyroid Function Tests: No results for input(s): TSH, T4TOTAL, FREET4, T3FREE, THYROIDAB in the last 72 hours. Anemia Panel: No results for input(s): VITAMINB12, FOLATE, FERRITIN, TIBC, IRON, RETICCTPCT in the last 72 hours. Urine analysis:    Component Value Date/Time   COLORURINE YELLOW 12/03/2015 [place] 12/03/2015 1746   LABSPEC 1.009 12/03/2015 1746   PHURINE 5.5 12/03/2015 1746   GLUCOSEU 100 (A) 12/03/2015 1746   HGBUR NEGATIVE 12/03/2015 1746   BILIRUBINUR NEGATIVE 12/03/2015 1746   KETONESUR NEGATIVE 12/03/2015 1746   PROTEINUR 100 (A) 12/03/2015 1746   UROBILINOGEN 0.2 10/01/2014 1124   NITRITE NEGATIVE 12/03/2015 1746   LEUKOCYTESUR NEGATIVE 12/03/2015 1746   Sepsis Labs: _1 (procalcitonin:4,lacticidven:4)  ) Recent Results (from the past 240 hour(s))  SARS Coronavirus 2 by RT PCR (hospital order, performed in [facility] hospital lab) Nasopharyngeal Nasopharyngeal Swab      Status: None   Collection Time: 03/15/20  8:19 PM   Specimen: Nasopharyngeal Swab  Result Value Ref Range Status   SARS Coronavirus 2 NEGATIVE NEGATIVE Final    Comment: (NOTE) SARS-CoV-2 target nucleic acids are NOT DETECTED.  The SARS-CoV-2 RNA is generally detectable in upper and lower respiratory specimens during the acute phase of infection. The lowest concentration of SARS-CoV-2 viral copies this assay can detect is 250 copies / mL. A negative result does not preclude SARS-CoV-2 infection and should not be used as the sole basis for treatment or other patient management decisions.  A negative result may occur with improper specimen collection / handling, submission of specimen other than nasopharyngeal swab, presence of viral mutation(s) within the areas targeted by this assay, and inadequate number of viral copies (<250 copies / mL). A negative result must be combined with clinical observations, patient history, and epidemiological information.  Fact Sheet for Patients:   StrictlyIdeas.no  Fact Sheet for Healthcare Providers: BankingDealers.co.za  This test is not yet approved or  cleared by the [place] and has been authorized for detection and/or diagnosis of SARS-CoV-2 by FDA under an Emergency Use Authorization (EUA).  This EUA will remain in effect (meaning this test can be used) for the duration of the COVID-19 declaration under Section 564(b)(1) of the Act, 21 U.S.C. section 360bbb-3(b)(1), unless the authorization is terminated or revoked sooner.  Performed at [facility], [place] [address] [place], [place] [address]   MRSA PCR Screening     Status: None   Collection Time: 03/17/20  5:40 AM   Specimen: Nasal Mucosa; Nasopharyngeal  Result Value Ref Range Status   MRSA by PCR NEGATIVE NEGATIVE Final    Comment:        The GeneXpert MRSA Assay (FDA approved for NASAL specimens only), is one component  of a comprehensive MRSA colonization surveillance program. It is not intended to diagnose MRSA infection nor to guide or monitor treatment for MRSA infections. Performed at [facility], [place] [address], [place], [place] [address]          Radiology Studies: No results found.      Scheduled Meds: . calcitRIOL  1.5 mcg Oral Q M,W,F-HD  . Chlorhexidine Gluconate Cloth  6 each Topical Q0600  . [START ON 03/19/2020] Chlorhexidine Gluconate Cloth  6 each Topical Q0600  . [START ON 03/19/2020] ciprofloxacin  500 mg Oral Q breakfast  . ferric citrate  210 mg Oral TID WC  . fluticasone furoate-vilanterol  1 puff Inhalation Daily  . insulin aspart  0-9 Units Subcutaneous TID WC  . insulin glargine  10 Units Subcutaneous Daily  . lacosamide  200 mg Oral 2 times per day on Sun Tue Thu Sat  . lacosamide  200 mg Oral 3 times per day on Mon Wed Fri  . lamoTRIgine  200 mg Oral BID  . metroNIDAZOLE  500 mg Oral Q8H  . midodrine  10 mg Oral BID WC  . multivitamin  1 tablet Oral QHS  . pantoprazole  40 mg Oral Daily  . sodium chloride flush  3 mL Intravenous Once   Continuous Infusions:    LOS: 2 days    Time spent: 25mn  PDomenic Polite MD [facility] 03/18/2020, 1:42 PM

## 2020-03-19 LAB — CBC
HCT: 24.9 % — ABNORMAL LOW (ref 39.0–52.0)
Hemoglobin: 9.2 g/dL — ABNORMAL LOW (ref 13.0–17.0)
MCH: 32.4 pg (ref 26.0–34.0)
MCHC: 36.9 g/dL — ABNORMAL HIGH (ref 30.0–36.0)
MCV: 87.7 fL (ref 80.0–100.0)
Platelets: 356 10*3/uL (ref 150–400)
RBC: 2.84 MIL/uL — ABNORMAL LOW (ref 4.22–5.81)
RDW: 15.9 % — ABNORMAL HIGH (ref 11.5–15.5)
WBC: 9.6 10*3/uL (ref 4.0–10.5)
nRBC: 3.2 % — ABNORMAL HIGH (ref 0.0–0.2)

## 2020-03-19 LAB — BASIC METABOLIC PANEL
Anion gap: 13 (ref 5–15)
BUN: 54 mg/dL — ABNORMAL HIGH (ref 6–20)
CO2: 24 mmol/L (ref 22–32)
Calcium: 8.2 mg/dL — ABNORMAL LOW (ref 8.9–10.3)
Chloride: 96 mmol/L — ABNORMAL LOW (ref 98–111)
Creatinine, Ser: 12.36 mg/dL — ABNORMAL HIGH (ref 0.61–1.24)
GFR calc Af Amer: 5 mL/min — ABNORMAL LOW (ref >60)
GFR calc non Af Amer: 4 mL/min — ABNORMAL LOW (ref >60)
Glucose, Bld: 131 mg/dL — ABNORMAL HIGH (ref 70–99)
Potassium: 4.3 mmol/L (ref 3.5–5.1)
Sodium: 133 mmol/L — ABNORMAL LOW (ref 135–145)

## 2020-03-19 LAB — GLUCOSE, CAPILLARY
Glucose-Capillary: 118 mg/dL — ABNORMAL HIGH (ref 70–99)
Glucose-Capillary: 103 mg/dL — ABNORMAL HIGH (ref 70–99)
Glucose-Capillary: 133 mg/dL — ABNORMAL HIGH (ref 70–99)

## 2020-03-19 MED ORDER — METRONIDAZOLE 500 MG PO TABS
250.0000 mg | ORAL_TABLET | Freq: Three times a day (TID) | ORAL | Status: DC
  Administered 2020-03-19 – 2020-03-20 (×3): 250 mg via ORAL
  Filled 2020-03-19 (×3): qty 1

## 2020-03-19 MED FILL — clonazePAM 0.5 MG TABS: 0.5 | 35 days supply | Qty: 30 | Fill #2

## 2020-03-19 NOTE — Progress Notes (Signed)
[facility] Progress Note   Subjective: No C/Os, waiting to go to HD. No C/Os.   Objective Vitals:   03/18/20 1656 03/18/20 2059 03/19/20 0530 03/19/20 0830  BP: (!) 123/46 (!) 132/55 (!) 86/39 (!) 102/55  Pulse: 67 66 65 62  Resp:  _0 Temp: (!) 97.5 F (36.4 C) 97.7 F (36.5 C) 97.8 F (36.6 C) 98.1 F (36.7 C)  TempSrc: Oral Oral  Oral  SpO2: 96% 98% 97% 95%  Weight:   92.5 kg   Height:       Physical Exam General: Pleasant male in NAD Heart: S1,S2 RRR Lungs: CTAB Abdomen: S, NT Extremities: No LE edema Dialysis Access: R AVF + T/B    Additional Objective Labs: Basic Metabolic Panel: Recent Labs  Lab 03/17/20 0424 03/18/20 0626 03/19/20 0353  NA 132* 131* 133*  K 4.3 4.2 4.3  CL 96* 94* 96*  CO2 _1 GLUCOSE 122* 84 131*  BUN 32* 44* 54*  CREATININE 7.65* 9.93* 12.36*  CALCIUM 8.4* 8.4* 8.2*   Liver Function Tests: Recent Labs  Lab 03/15/20 1631  AST 52*  ALT 28  ALKPHOS 129*  BILITOT 1.5*  PROT 7.1  ALBUMIN 4.1   Recent Labs  Lab 03/15/20 1631  LIPASE 35   CBC: Recent Labs  Lab 03/15/20 1631 03/15/20 1631 03/16/20 0436 03/16/20 0436 03/17/20 0424 03/18/20 0626 03/19/20 0353  WBC 15.8*   < > 18.6*   < > 14.4* 11.5* 9.6  HGB 12.3*   < > 10.6*   < > 9.2* 9.3* 9.2*  HCT 33.7*   < > 29.2*   < > 25.3* 24.8* 24.9*  MCV 90.8  --  90.7  --  89.4 88.6 87.7  PLT 283   < > 262   < > 271 338 356   < > = values in this interval not displayed.   Blood Culture    Component Value Date/Time   SDES BLOOD BLOOD LEFT HAND 10/04/2019 0947   SPECREQUEST  10/04/2019 0947    BOTTLES DRAWN AEROBIC AND ANAEROBIC Blood Culture results may not be optimal due to an inadequate volume of blood received in culture bottles   CULT  10/04/2019 0947    NO GROWTH 5 DAYS Performed at [facility], [place] [address] [place], [place] [address]    REPTSTATUS 10/09/2019 FINAL 10/04/2019 0947    Cardiac Enzymes: No results for  input(s): CKTOTAL, CKMB, CKMBINDEX, TROPONINI in the last 168 hours. CBG: Recent Labs  Lab 03/18/20 1131 03/18/20 1619 03/18/20 2056 03/19/20 0638 03/19/20 1107  GLUCAP 148* 101* 111* 118* 103*   Iron Studies: No results for input(s): IRON, TIBC, TRANSFERRIN, FERRITIN in the last 72 hours. _2 @ Studies/Results: No results found. Medications:  . calcitRIOL  1.5 mcg Oral Q M,W,F-HD  . Chlorhexidine Gluconate Cloth  6 each Topical Q0600  . Chlorhexidine Gluconate Cloth  6 each Topical Q0600  . ciprofloxacin  500 mg Oral Q breakfast  . ferric citrate  210 mg Oral TID WC  . fluticasone furoate-vilanterol  1 puff Inhalation Daily  . insulin aspart  0-9 Units Subcutaneous TID WC  . insulin glargine  10 Units Subcutaneous Daily  . lacosamide  200 mg Oral 2 times per day on Sun Tue Thu Sat  . lacosamide  200 mg Oral 3 times per day on Mon Wed Fri  . lamoTRIgine  200 mg Oral BID  . metroNIDAZOLE  250 mg Oral Q8H  .  midodrine  10 mg Oral BID WC  . multivitamin  1 tablet Oral QHS  . pantoprazole  40 mg Oral Daily  . sodium chloride flush  3 mL Intravenous Once     Dialysis Orders: MWF -[facility] 4hrs, BFR400, [id number], EDW 83kg,2K/2Ca  Access:RU AVF Heparinnone Mircera39mg q2wks - last 03/14/20 Calcitriol1.561m PO qHD  Assessment/Plan: 1. Colitis with BRBPR- identified on CT, etiology unclear. FOBT+. Hgb9.2>9.3 today.On ABX. Tolerating renal/carb modified diet.Per primary.  GI signed off.  2. ESRD- On HD MWF. Next HD today.   3. Hypotension/volume- chronic hypotension. On midodrine. Does not appear volume overloaded. UF to dry as tolerated. 4. Anemiaof CKD- Hgbstable at 9.3.Slowly trending down over last month from 11.5>10.5. ESA just restartedand recently dosed. Follow trends.  5. Secondary Hyperparathyroidism -Ca and phos at goal. Continue binders/VDRA. 6. Nutrition- Renal/carb modified diet w/fluid restrictions.  7. Seizures -  per primary 8. Hx CVA 9. OSA on CPAP 10. Hx PE/DVT-s/p IVC filter 11. A fib 12. Multiple myelomacurrently on Velcade and daratumumab 13. Chronic SDH  [place] Termini H. Aniela Caniglia NP-C 03/19/2020, 12:23 PM  [phone]

## 2020-03-19 NOTE — Progress Notes (Signed)
PROGRESS NOTE    [place]  WYO:[id number] DOB: July 08, 1966 DOA: 03/15/2020 PCP: [name], MD  Brief Narrative: Isaiah [name] is a 54 y.o. male with medical history significant for ESRD on MWF HD, multiple myeloma (currently on Velcade & daratumumab), history of subdural hematoma (with residual right hemiparesis, dysarthria), history of DVT/PE s/p IVC filter, history of CVA, paroxysmal atrial fibrillation not on anticoagulation, insulin-dependent type 2 diabetes, COPD, anemia of chronic disease, seizure disorder, and OSA not requiring CPAP who presented to the ED for evaluation of BRBPR. -Patient went to dialysis on 7/7 he had abdominal cramping and low blood pressure at the time, following this had 3 episodes of diarrhea followed by bright red blood per rectum on 7/8 morning onwards, he had 3-4 episodes of this with left-sided abdominal discomfort. -Denied any nausea or vomiting -Wife reported prior colonoscopy by Dr. Michail Sermon in 2018 which noted internal hemorrhoids and polyps   Assessment & Plan:   Ischemic colitis Bright red blood per rectum -Left-sided colitis noted on CT, clinically suspect ischemic colitis in the setting of hypotension, dialysis etc. -Day 4 of Cipro/Flagyl, now p.o. -Appreciate [facility] input, recommended follow-up -Advanced diet, increased midodrine dose -Episode of vomiting this morning, continue supportive care -Discharge planning  ESRD on hemodialysis Monday Wednesday Friday -Renal consulted, dialysis today  Chronic hypotension -On midodrine on dialysis days -Increased dose to 10 mg twice daily for now  Multiple myeloma Followed by Dr. Alen Blew.  Currently on treatment with Velcade and daratumumab.  Seizure disorder -Continue Lamictal and Vimpat  Type 2 diabetes mellitus -Continue Basaglar 10 units, sliding scale insulin  Paroxysmal atrial fibrillation -No longer on anticoagulation, given history of subdural hematoma -Not appropriate in this  setting regardless  Obstructive sleep apnea -Uses nocturnal O2 as needed  DVT prophylaxis: SCDs Code Status: Full code Family Communication: Called and updated spouse 7/10 Disposition Plan:  Status is: Inpatient  Inpatient due to severity of illness: Vomited breakfast this morning, home tomorrow if stable  Dispo: The patient is from: Home              Anticipated d/c is to: Home              Anticipated d/c date is: 7/13              Patient currently is not medically stable to d/c.  Consultants:   [facility]  Renal   Procedures:   Antimicrobials:    Subjective: -Vomited his breakfast this morning, complains of nausea, denies abdominal pain or bleeding  Objective: Vitals:   03/18/20 1656 03/18/20 2059 03/19/20 0530 03/19/20 0830  BP: (!) 123/46 (!) 132/55 (!) 86/39 (!) 102/55  Pulse: 67 66 65 62  Resp:  18 18 18   Temp: (!) 97.5 F (36.4 C) 97.7 F (36.5 C) 97.8 F (36.6 C) 98.1 F (36.7 C)  TempSrc: Oral Oral  Oral  SpO2: 96% 98% 97% 95%  Weight:   92.5 kg   Height:        Intake/Output Summary (Last 24 hours) at 03/19/2020 1429 Last data filed at 03/19/2020 0600 Gross per 24 hour  Intake 600 ml  Output 0 ml  Net 600 ml   Filed Weights   03/16/20 2109 03/17/20 2214 03/19/20 0530  Weight: 88.9 kg 90.9 kg 92.5 kg    Examination:  General exam: Chronically ill male sitting up in bed, AAOx3, no distress CVS: S1-S2, regular rate rhythm Lungs: Decreased breath sounds to bases Abdomen: Soft,  nontender, bowel sounds present Extremities: No edema Skin: No rashes on exposed skin Psychiatry:  Mood & affect appropriate.     Data Reviewed:   CBC: Recent Labs  Lab 03/15/20 1631 03/16/20 0436 03/17/20 0424 03/18/20 0626 03/19/20 0353  WBC 15.8* 18.6* 14.4* 11.5* 9.6  HGB 12.3* 10.6* 9.2* 9.3* 9.2*  HCT 33.7* 29.2* 25.3* 24.8* 24.9*  MCV 90.8 90.7 89.4 88.6 87.7  PLT 283 262 271 338 644   Basic Metabolic Panel: Recent Labs  Lab  03/15/20 1631 03/16/20 0436 03/17/20 0424 03/18/20 0626 03/19/20 0353  NA 138 137 132* 131* 133*  K 4.6 4.4 4.3 4.2 4.3  CL 92* 93* 96* 94* 96*  CO2 29 26 23 22 24   GLUCOSE 237* 82 122* 84 131*  BUN 42* 50* 32* 44* 54*  CREATININE 8.68* 10.15* 7.65* 9.93* 12.36*  CALCIUM 9.2 8.8* 8.4* 8.4* 8.2*   GFR: Estimated Creatinine Clearance: 7.6 mL/min (A) (by C-G formula based on SCr of 12.36 mg/dL (H)). Liver Function Tests: Recent Labs  Lab 03/15/20 1631  AST 52*  ALT 28  ALKPHOS 129*  BILITOT 1.5*  PROT 7.1  ALBUMIN 4.1   Recent Labs  Lab 03/15/20 1631  LIPASE 35   No results for input(s): AMMONIA in the last 168 hours. Coagulation Profile: No results for input(s): INR, PROTIME in the last 168 hours. Cardiac Enzymes: No results for input(s): CKTOTAL, CKMB, CKMBINDEX, TROPONINI in the last 168 hours. BNP (last 3 results) No results for input(s): PROBNP in the last 8760 hours. HbA1C: No results for input(s): HGBA1C in the last 72 hours. CBG: Recent Labs  Lab 03/18/20 1131 03/18/20 1619 03/18/20 2056 03/19/20 0638 03/19/20 1107  GLUCAP 148* 101* 111* 118* 103*   Lipid Profile: No results for input(s): CHOL, HDL, LDLCALC, TRIG, CHOLHDL, LDLDIRECT in the last 72 hours. Thyroid Function Tests: No results for input(s): TSH, T4TOTAL, FREET4, T3FREE, THYROIDAB in the last 72 hours. Anemia Panel: No results for input(s): VITAMINB12, FOLATE, FERRITIN, TIBC, IRON, RETICCTPCT in the last 72 hours. Urine analysis:    Component Value Date/Time   COLORURINE YELLOW 12/03/2015 [place] 12/03/2015 1746   LABSPEC 1.009 12/03/2015 1746   PHURINE 5.5 12/03/2015 1746   GLUCOSEU 100 (A) 12/03/2015 1746   HGBUR NEGATIVE 12/03/2015 1746   BILIRUBINUR NEGATIVE 12/03/2015 1746   KETONESUR NEGATIVE 12/03/2015 1746   PROTEINUR 100 (A) 12/03/2015 1746   UROBILINOGEN 0.2 10/01/2014 1124   NITRITE NEGATIVE 12/03/2015 1746   LEUKOCYTESUR NEGATIVE 12/03/2015 1746    Sepsis Labs: @LABRCNTIP (procalcitonin:4,lacticidven:4)  ) Recent Results (from the past 240 hour(s))  SARS Coronavirus 2 by RT PCR (hospital order, performed in [facility] hospital lab) Nasopharyngeal Nasopharyngeal Swab     Status: None   Collection Time: 03/15/20  8:19 PM   Specimen: Nasopharyngeal Swab  Result Value Ref Range Status   SARS Coronavirus 2 NEGATIVE NEGATIVE Final    Comment: (NOTE) SARS-CoV-2 target nucleic acids are NOT DETECTED.  The SARS-CoV-2 RNA is generally detectable in upper and lower respiratory specimens during the acute phase of infection. The lowest concentration of SARS-CoV-2 viral copies this assay can detect is 250 copies / mL. A negative result does not preclude SARS-CoV-2 infection and should not be used as the sole basis for treatment or other patient management decisions.  A negative result may occur with improper specimen collection / handling, submission of specimen other than nasopharyngeal swab, presence of viral mutation(s) within the areas targeted by this assay, and  inadequate number of viral copies (<250 copies / mL). A negative result must be combined with clinical observations, patient history, and epidemiological information.  Fact Sheet for Patients:   StrictlyIdeas.no  Fact Sheet for Healthcare Providers: BankingDealers.co.za  This test is not yet approved or  cleared by the [place] FDA and has been authorized for detection and/or diagnosis of SARS-CoV-2 by FDA under an Emergency Use Authorization (EUA).  This EUA will remain in effect (meaning this test can be used) for the duration of the COVID-19 declaration under Section 564(b)(1) of the Act, 21 U.S.C. section 360bbb-3(b)(1), unless the authorization is terminated or revoked sooner.  Performed at [facility], [place] [address] [place], [place] [address]   MRSA PCR Screening     Status: None   Collection Time:  03/17/20  5:40 AM   Specimen: Nasal Mucosa; Nasopharyngeal  Result Value Ref Range Status   MRSA by PCR NEGATIVE NEGATIVE Final    Comment:        The GeneXpert MRSA Assay (FDA approved for NASAL specimens only), is one component of a comprehensive MRSA colonization surveillance program. It is not intended to diagnose MRSA infection nor to guide or monitor treatment for MRSA infections. Performed at [facility], [place] [address], [place], [place] [address]          Radiology Studies: No results found.      Scheduled Meds: . calcitRIOL  1.5 mcg Oral Q M,W,F-HD  . Chlorhexidine Gluconate Cloth  6 each Topical Q0600  . Chlorhexidine Gluconate Cloth  6 each Topical Q0600  . ciprofloxacin  500 mg Oral Q breakfast  . ferric citrate  210 mg Oral TID WC  . fluticasone furoate-vilanterol  1 puff Inhalation Daily  . insulin aspart  0-9 Units Subcutaneous TID WC  . insulin glargine  10 Units Subcutaneous Daily  . lacosamide  200 mg Oral 2 times per day on Sun Tue Thu Sat  . lacosamide  200 mg Oral 3 times per day on Mon Wed Fri  . lamoTRIgine  200 mg Oral BID  . metroNIDAZOLE  250 mg Oral Q8H  . midodrine  10 mg Oral BID WC  . multivitamin  1 tablet Oral QHS  . pantoprazole  40 mg Oral Daily  . sodium chloride flush  3 mL Intravenous Once   Continuous Infusions:    LOS: 3 days    Time spent: 26mn  PDomenic Polite MD [facility] 03/19/2020, 2:29 PM

## 2020-03-20 ENCOUNTER — Ambulatory Visit: Payer: Medicare Other | Admitting: Physical Therapy

## 2020-03-20 ENCOUNTER — Ambulatory Visit: Payer: Self-pay

## 2020-03-20 ENCOUNTER — Telehealth: Payer: Self-pay

## 2020-03-20 DIAGNOSIS — N186 End stage renal disease: Secondary | ICD-10-CM

## 2020-03-20 DIAGNOSIS — E1122 Type 2 diabetes mellitus with diabetic chronic kidney disease: Secondary | ICD-10-CM

## 2020-03-20 DIAGNOSIS — C9000 Multiple myeloma not having achieved remission: Secondary | ICD-10-CM

## 2020-03-20 DIAGNOSIS — Z992 Dependence on renal dialysis: Secondary | ICD-10-CM

## 2020-03-20 LAB — GLUCOSE, CAPILLARY
Glucose-Capillary: 80 mg/dL (ref 70–99)
Glucose-Capillary: 130 mg/dL — ABNORMAL HIGH (ref 70–99)

## 2020-03-20 MED ORDER — METRONIDAZOLE 250 MG PO TABS: 250.0000 mg | ORAL_TABLET | Freq: Three times a day (TID) | ORAL | 0 refills | Status: DC

## 2020-03-20 MED ORDER — ATORVASTATIN CALCIUM 20 MG PO TABS: 20.0000 mg | ORAL_TABLET | Freq: Every day | ORAL | 0 refills | Status: DC

## 2020-03-20 MED ORDER — CIPROFLOXACIN HCL 250 MG PO TABS: 250.0000 mg | ORAL_TABLET | Freq: Every day | ORAL | 0 refills | Status: DC

## 2020-03-20 MED ORDER — MIDODRINE HCL 10 MG PO TABS: 10.0000 mg | ORAL_TABLET | Freq: Two times a day (BID) | ORAL | 0 refills | Status: DC

## 2020-03-20 MED FILL — metroNIDAZOLE 250 MG TABS: 250 | 3 days supply | Qty: 9 | Fill #0

## 2020-03-20 MED FILL — ATORVASTATIN 20 MG TABLET: 20 | 30 days supply | Qty: 30 | Fill #0

## 2020-03-20 MED FILL — CIPROFLOXACIN HCL 250 MG TA: 250 | 3 days supply | Qty: 3 | Fill #0

## 2020-03-20 NOTE — Discharge Summary (Addendum)
Physician Discharge Summary  [place] LKG:[id number] DOB: 1965-10-03 DOA: 03/15/2020  PCP: [name], MD  Admit date: 03/15/2020 Discharge date: 03/20/2020  Time spent: 35 minutes  Recommendations for Outpatient Follow-up:  1. Okay to resume outpatient neuro rehab, therapy services 2. Gastroenterology Dr. Michail Sermon in 6 weeks   Discharge Diagnoses:  Principal Problem: Ischemic colitis Chronic hypotension   Multiple myeloma not having achieved remission ([place])   ESRD on hemodialysis ([place])   DM type 2 (diabetes mellitus, type 2) (HCC)   Seizure disorder (HCC) History of subdural hematoma History of DVT/PE status post IVC filter History of CVA Anemia of chronic disease COPD (chronic obstructive pulmonary disease) (HCC) Paroxysmal atrial fibrillation Obstructive sleep apnea  Discharge Condition: Improved  Diet recommendation: Renal, diabetic  Filed Weights   03/19/20 0530 03/19/20 1512 03/20/20 0458  Weight: 92.5 kg 89.6 kg 94.4 kg    History of present illness:  [name] Hillsis a 54 y.o.malewith medical history significant forESRD on MWF HD, multiple myeloma (currently on Velcade& daratumumab),history of subdural hematoma (with residual right hemiparesis, dysarthria), history of DVT/PE s/p IVC filter, history of CVA, paroxysmal atrial fibrillation not on anticoagulation, insulin-dependent type 2 diabetes, COPD, anemia of chronic disease, seizure disorder, and OSAnot requiring CPAPwho presented to the ED for evaluation of BRBPR. -Patient went to dialysis on 7/7 he had abdominal cramping and low blood pressure at the time, following this had 3 episodes of diarrhea followed by bright red blood per rectum on 7/8 morning onwards, he had 3-4 episodes of this with left-sided abdominal discomfort  Hospital Course:   Ischemic colitis Bright red blood per rectum -Admitted with abdominal pain, bright red blood per rectum following dialysis and hypotension  -left-sided  colitis noted on CT, clinically suspect ischemic colitis in the setting of hypotension, dialysis etc. -Treated with supportive care, IV Cipro/Flagyl, then changed to p.o., continue this for 3 more days  -Followed by [facility] gastroenterology Dr. Michail Sermon, recommended outpatient follow-up in 6 weeks -Advanced diet, increased midodrine dose -Improved and stable at this time, tolerating diet, no further episodes of bleeding  ESRD on hemodialysis Monday Wednesday Friday -Renal consulted, dialyzed yesterday  Chronic hypotension -On midodrine on dialysis days -Increased dose to 10 mg twice daily for now  Multiple myeloma Followed by Dr. Alen Blew. Currently on treatment with Velcade and daratumumab.  Seizure disorder -Continue Lamictal and Vimpat  Type 2 diabetes mellitus -Continue Basaglar 10 units, sliding scale insulin  Paroxysmal atrial fibrillation -No longer on anticoagulation, given history of subdural hematoma -Not appropriate in this setting regardless  Obstructive sleep apnea -Uses nocturnal O2 as needed  Consultants:   [facility]  Renal  Discharge Exam: Vitals:   03/20/20 0802 03/20/20 0838  BP:  98/80  Pulse: (!) 58 64  Resp: 16 18  Temp:  97.9 F (36.6 C)  SpO2: 92% 92%    General: Awake alert oriented x3, mild cognitive deficits noted Cardiovascular: S1-S2, regular rate rhythm Respiratory: Clear  Discharge Instructions   Discharge Instructions    Discharge instructions   Complete by: As directed    Renal Diabetic diet   Increase activity slowly   Complete by: As directed      Allergies as of 03/20/2020      Reactions   Codeine Swelling   Codeine Rash, Other (See Comments)   Unknown reaction (patient says it was more serious than just a rash, but he can't remember what happened)      Medication List    TAKE  these medications   acetaminophen 325 MG tablet Commonly known as: TYLENOL Take 1-2 tablets (325-650 mg total) by  mouth every 4 (four) hours as needed for mild pain.   acyclovir 400 MG tablet Commonly known as: ZOVIRAX TAKE 1 TABLET BY MOUTH ONCE DAILY   albuterol 108 (90 Base) MCG/ACT inhaler Commonly known as: ProAir HFA Inhale two puffs every 4-6 hours if needed for cough or wheeze What changed:   how much to take  how to take this  when to take this  reasons to take this   atorvastatin 20 MG tablet Commonly known as: Lipitor Take 1 tablet (20 mg total) by mouth daily.   Auryxia 1 GM 210 MG(Fe) tablet Generic drug: ferric citrate Take 210 mg by mouth 3 (three) times daily.   Basaglar KwikPen 100 UNIT/ML Inject 10 Units into the skin daily.   Breo Ellipta 200-25 MCG/INH Aepb Generic drug: fluticasone furoate-vilanterol INHALE 1 PUFF BY MOUTH AT THE SAME TIME EVERY DAY What changed: See the new instructions.   camphor-menthol lotion Commonly known as: SARNA Apply 1 application topically every 8 (eight) hours as needed for itching.   Cetirizine HCl 10 MG Caps Take 1 capsule by mouth daily.   ciprofloxacin 250 MG tablet Commonly known as: CIPRO Take 1 tablet (250 mg total) by mouth daily with breakfast. Start taking on: March 21, 2020   clonazePAM 0.5 MG tablet Commonly known as: KLONOPIN Take 2 tablets every Monday, Wednesday, and Friday with hemodialysis What changed:   how much to take  how to take this  when to take this  additional instructions   dexamethasone 4 MG tablet Commonly known as: DECADRON Take 5 tablets (20 mg total) by mouth once a week. Take 5 tablets (20 mg total) by mouth once a week on day of chemo. What changed:   when to take this  additional instructions   feeding supplement (NEPRO CARB STEADY) Liqd Take 237 mLs by mouth 3 (three) times daily as needed (Supplement).   freestyle lancets   FREESTYLE LITE test strip Generic drug: glucose blood   HUMALOG KWIKPEN [place] Inject 2-4 Units into the skin 3 (three) times daily before meals.  Sliding scale: 150-250 =2 units, 250 -350=3 units, 350 += 4 units   hydrOXYzine 25 MG tablet Commonly known as: ATARAX/VISTARIL Take 1 tablet (25 mg total) by mouth every 8 (eight) hours as needed for itching.   lacosamide 200 MG Tabs tablet Commonly known as: Vimpat Take 1 tablet twice a day except on dialysis days M-W-F give 1 tab in AM, 1 tab after dialysis, 1 tab in PM. What changed:   how much to take  how to take this  when to take this   lamoTRIgine 200 MG tablet Commonly known as: LAMICTAL Take 1 tablet (200 mg total) by mouth 2 (two) times daily.   metroNIDAZOLE 250 MG tablet Commonly known as: FLAGYL Take 1 tablet (250 mg total) by mouth every 8 (eight) hours.   midodrine 10 MG tablet Commonly known as: PROAMATINE Take 1 tablet (10 mg total) by mouth 2 (two) times daily with a meal. What changed:   medication strength  how much to take  how to take this  when to take this  additional instructions   multivitamin Tabs tablet Take 1 tablet by mouth at bedtime.   pantoprazole 40 MG tablet Commonly known as: PROTONIX TAKE 1 TABLET BY MOUTH ONCE DAILY   prochlorperazine 10 MG tablet Commonly known as: COMPAZINE TAKE  1 TABLET (10 MG TOTAL) BY MOUTH EVERY 6 (SIX) HOURS AS NEEDED FOR NAUSEA OR VOMITING.   SM Alcohol Prep 70 % Pads   Unifine Pentips 31G X 8 MM Misc Generic drug: Insulin Pen Needle USE AS DIRECTED DAILY AFTER SUPPER      Allergies  Allergen Reactions  . Codeine Swelling  . Codeine Rash and Other (See Comments)    Unknown reaction (patient says it was more serious than just a rash, but he can't remember what happened)     Follow-up Information    [name], MD. Schedule an appointment as soon as possible for a visit in 1 week(s).   Specialty: Internal Medicine Contact information: [address] [place] [address] [phone]        Wilford Corner, MD. Schedule an appointment as soon as possible for a  visit in 6 week(s).   Specialty: Gastroenterology Contact information: [address] [place] [address] [phone]                The results of significant diagnostics from this hospitalization (including imaging, microbiology, ancillary and laboratory) are listed below for reference.    Significant Diagnostic Studies: CT Abdomen Pelvis W Contrast  Result Date: 03/15/2020 CLINICAL DATA:  Rectal bleeding EXAM: CT ABDOMEN AND PELVIS WITH CONTRAST TECHNIQUE: Multidetector CT imaging of the abdomen and pelvis was performed using the standard protocol following bolus administration of intravenous contrast. CONTRAST:  112m OMNIPAQUE IOHEXOL 300 MG/ML  SOLN COMPARISON:  CT 08/09/2019 FINDINGS: Lower chest: Atelectatic changes noted in the lung bases. Normal heart size. No pericardial effusion. Small subcutaneous nodule along the right anterior chest wall measuring up to 1.6 cm, unchanged from prior and likely reflective of small dermal inclusion cyst. Some posterior chest wall edematous changes and skin thickening are noted. Hepatobiliary: No focal liver abnormality is seen. Patient is post cholecystectomy. Slight prominence of the biliary tree likely related to reservoir effect. No calcified intraductal gallstones. Pancreas: No visible or concerning pancreatic lesion. Mild pancreatic atrophy particularly towards the head neck junction. No peripancreatic inflammation or ductal dilatation. Spleen: Diminutive spleen with peripheral calcifications similar to the comparison exam. No new splenic lesions. Adrenals/Urinary Tract: Mild thickening of the adrenal glands without discrete nodule. Bilateral renal atrophy and vascular calcifications with chronic mild bilateral perinephric stranding and symmetrically delayed nephrograms and excretion. Multiple bilateral fluid attenuation cysts are present. No obstructive urolithiasis or hydronephrosis is seen. Urinary bladder is decompressed at  the time of imaging with some marked mural thickening and intramural fatty infiltration as well as mild perivesicular hazy stranding. Stomach/Bowel: Distal esophagus, stomach and duodenal sweep are unremarkable. No small bowel wall thickening or dilatation. No evidence of obstruction. Appendix is fluid-filled but normal caliber without focal periappendiceal stranding or inflammation to suggest acute appendicitis. Moderate proximal colonic stool burden without wall thickening or dilatation. Edematous thickening of the distal colon is seen from the splenic flexure through the descending and rectosigmoid colon. Surrounding pericolonic stranding is present. Much of the distal colon is decompressed or air-filled with a small amount of liquid material within the rectal vault. No pneumatosis or air within the draining mesentery. No evidence of perforation or abscess. Vascular/Lymphatic: Atherosclerotic calcifications seen throughout the abdominal aorta and branch vessels including fairly significant plaque at the IMA origin which may result in at least some moderate to high-grade stenosis incompletely assessed on a non angiographic exam. No aneurysm or ectasia. Reproductive: The prostate and seminal vesicles are unremarkable. Other:  Asymmetric atrophy of the right rectus sheath and postsurgical changes of the right upper quadrant mild abdominal wall laxity. No bowel containing hernia. No abdominopelvic free air or fluid. Musculoskeletal: Multilevel discogenic and facet degenerative changes throughout the spine. Multilevel flowing anterior osteophytosis of the lower thoracic spine, compatible with features of diffuse idiopathic skeletal hyperostosis (DISH) though there is ankylosis of the bilateral SI joints. Additional degenerative changes in the hips. No acute osseous abnormality or suspicious osseous lesion. IMPRESSION: 1. Edematous thickening of the distal colon from the splenic flexure through the descending and  rectosigmoid colon with surrounding pericolonic stranding, consistent with colitis, which may be infectious, inflammatory or vascular in etiology particularly given some extensive plaque at the IMA origin and the distal colonic distribution. No evidence of perforation or abscess. 2. Urinary bladder is decompressed however there is marked mural thickening and mildperivesicular hazy stranding. Bilateral renal atrophy and vascular calcifications with chronic mild bilateral perinephric stranding and symmetrically delayed nephrograms and excretion. Can be seen in the setting of chronic renal disease or infection. Correlate with urinalysis to exclude cystitis and ascending tract infection. 3. Aortic Atherosclerosis (ICD10-I70.0). Electronically Signed   By: Lovena Le M.D.   On: 03/15/2020 21:56    Microbiology: Recent Results (from the past 240 hour(s))  SARS Coronavirus 2 by RT PCR (hospital order, performed in [facility] hospital lab) Nasopharyngeal Nasopharyngeal Swab     Status: None   Collection Time: 03/15/20  8:19 PM   Specimen: Nasopharyngeal Swab  Result Value Ref Range Status   SARS Coronavirus 2 NEGATIVE NEGATIVE Final    Comment: (NOTE) SARS-CoV-2 target nucleic acids are NOT DETECTED.  The SARS-CoV-2 RNA is generally detectable in upper and lower respiratory specimens during the acute phase of infection. The lowest concentration of SARS-CoV-2 viral copies this assay can detect is 250 copies / mL. A negative result does not preclude SARS-CoV-2 infection and should not be used as the sole basis for treatment or other patient management decisions.  A negative result may occur with improper specimen collection / handling, submission of specimen other than nasopharyngeal swab, presence of viral mutation(s) within the areas targeted by this assay, and inadequate number of viral copies (<250 copies / mL). A negative result must be combined with clinical observations, patient history, and  epidemiological information.  Fact Sheet for Patients:   StrictlyIdeas.no  Fact Sheet for Healthcare Providers: BankingDealers.co.za  This test is not yet approved or  cleared by the [place] FDA and has been authorized for detection and/or diagnosis of SARS-CoV-2 by FDA under an Emergency Use Authorization (EUA).  This EUA will remain in effect (meaning this test can be used) for the duration of the COVID-19 declaration under Section 564(b)(1) of the Act, 21 U.S.C. section 360bbb-3(b)(1), unless the authorization is terminated or revoked sooner.  Performed at [facility], [place] [name] [place], [place] [address]   MRSA PCR Screening     Status: None   Collection Time: 03/17/20  5:40 AM   Specimen: Nasal Mucosa; Nasopharyngeal  Result Value Ref Range Status   MRSA by PCR NEGATIVE NEGATIVE Final    Comment:        The GeneXpert MRSA Assay (FDA approved for NASAL specimens only), is one component of a comprehensive MRSA colonization surveillance program. It is not intended to diagnose MRSA infection nor to guide or monitor treatment for MRSA infections. Performed at [facility], [place] [address] [place],  [address]  Labs: Basic Metabolic Panel: Recent Labs  Lab 03/15/20 1631 03/16/20 0436 03/17/20 0424 03/18/20 0626 03/19/20 0353  NA 138 137 132* 131* 133*  K 4.6 4.4 4.3 4.2 4.3  CL 92* 93* 96* 94* 96*  CO2 29 26 23 22 24   GLUCOSE 237* 82 122* 84 131*  BUN 42* 50* 32* 44* 54*  CREATININE 8.68* 10.15* 7.65* 9.93* 12.36*  CALCIUM 9.2 8.8* 8.4* 8.4* 8.2*   Liver Function Tests: Recent Labs  Lab 03/15/20 1631  AST 52*  ALT 28  ALKPHOS 129*  BILITOT 1.5*  PROT 7.1  ALBUMIN 4.1   Recent Labs  Lab 03/15/20 1631  LIPASE 35   No results for input(s): AMMONIA in the last 168 hours. CBC: Recent Labs  Lab 03/15/20 1631 03/16/20 0436 03/17/20 0424 03/18/20 0626 03/19/20 0353   WBC 15.8* 18.6* 14.4* 11.5* 9.6  HGB 12.3* 10.6* 9.2* 9.3* 9.2*  HCT 33.7* 29.2* 25.3* 24.8* 24.9*  MCV 90.8 90.7 89.4 88.6 87.7  PLT 283 262 271 338 356   Cardiac Enzymes: No results for input(s): CKTOTAL, CKMB, CKMBINDEX, TROPONINI in the last 168 hours. BNP: BNP (last 3 results) No results for input(s): BNP in the last 8760 hours.  ProBNP (last 3 results) No results for input(s): PROBNP in the last 8760 hours.  CBG: Recent Labs  Lab 03/19/20 0638 03/19/20 1107 03/19/20 2022 03/20/20 0639 03/20/20 1109  GLUCAP 118* 103* 133* 80 130*       Signed:  Domenic Polite MD.  [facility] 03/20/2020, 12:09 PM

## 2020-03-20 NOTE — Chronic Care Management (AMB) (Signed)
  Chronic Care Management   Inpatient Admit Review Note  03/20/2020 Name: Horacio Werth [facility] MRN: [id number] DOB: 05-Sep-1966  Joanne Chars Shaff is a 54 y.o. year old male who is a primary care patient of [name], MD. Jaynee Eagles is actively engaged with the embedded care management team in the primary care practice and is being followed by RN Case Manager BSW for assistance with disease management and care coordination needs related to DMII, ESRD and multiple myeloma.   [place] is currently admitted to the hospital for evaluation and treatment of GI bleed with plans to discharge today.  SW received voice message from patient spouse requesting return call. SW placed a successful outbound call to Mrs. [facility] who reports the patient is doing better and will discharge today. Mrs. Ulbrich reports the patient has yet to receive assessment for CAP services due to staffing challenges. She is hopeful assessment will be completed in the coming weeks.  Plan: SW collaborated with Consulting civil engineer to inform of planned discharge on 03/20/20. SW will continue to follow to assist with care coordination needs related to caregiver resources.   [name], BSW, CDP Social Worker, Certified Dementia Practitioner [place] [facility] [place] [facility] [phone]

## 2020-03-20 NOTE — Progress Notes (Signed)
[facility] Progress Note   Subjective: Going home today. Very happy.   Objective Vitals:   03/19/20 2021 03/20/20 0458 03/20/20 0802 03/20/20 0838  BP: (!) 92/56 (!) 84/36  98/80  Pulse: 66 66 (!) 58 64  Resp: _0 Temp: 99 F (37.2 C) 98.7 F (37.1 C)  97.9 F (36.6 C)  TempSrc: Oral Oral  Oral  SpO2: 96% 95% 92% 92%  Weight:  94.4 kg    Height:       Physical Exam General: Pleasant male in NAD Heart: S1,S2 RRR Lungs: CTAB Abdomen: S, NT Extremities: No LE edema Dialysis Access: R AVF + T/B   Additional Objective Labs: Basic Metabolic Panel: Recent Labs  Lab 03/17/20 0424 03/18/20 0626 03/19/20 0353  NA 132* 131* 133*  K 4.3 4.2 4.3  CL 96* 94* 96*  CO2 _1 GLUCOSE 122* 84 131*  BUN 32* 44* 54*  CREATININE 7.65* 9.93* 12.36*  CALCIUM 8.4* 8.4* 8.2*   Liver Function Tests: Recent Labs  Lab 03/15/20 1631  AST 52*  ALT 28  ALKPHOS 129*  BILITOT 1.5*  PROT 7.1  ALBUMIN 4.1   Recent Labs  Lab 03/15/20 1631  LIPASE 35   CBC: Recent Labs  Lab 03/15/20 1631 03/15/20 1631 03/16/20 0436 03/16/20 0436 03/17/20 0424 03/18/20 0626 03/19/20 0353  WBC 15.8*   < > 18.6*   < > 14.4* 11.5* 9.6  HGB 12.3*   < > 10.6*   < > 9.2* 9.3* 9.2*  HCT 33.7*   < > 29.2*   < > 25.3* 24.8* 24.9*  MCV 90.8  --  90.7  --  89.4 88.6 87.7  PLT 283   < > 262   < > 271 338 356   < > = values in this interval not displayed.   Blood Culture    Component Value Date/Time   SDES BLOOD BLOOD LEFT HAND 10/04/2019 0947   SPECREQUEST  10/04/2019 0947    BOTTLES DRAWN AEROBIC AND ANAEROBIC Blood Culture results may not be optimal due to an inadequate volume of blood received in culture bottles   CULT  10/04/2019 0947    NO GROWTH 5 DAYS Performed at [facility], [place] [address] [place], [place] [address]    REPTSTATUS 10/09/2019 FINAL 10/04/2019 0947    Cardiac Enzymes: No results for input(s): CKTOTAL, CKMB, CKMBINDEX, TROPONINI  in the last 168 hours. CBG: Recent Labs  Lab 03/19/20 0638 03/19/20 1107 03/19/20 2022 03/20/20 0639 03/20/20 1109  GLUCAP 118* 103* 133* 80 130*   Iron Studies: No results for input(s): IRON, TIBC, TRANSFERRIN, FERRITIN in the last 72 hours. _2 @ Studies/Results: No results found. Medications:  . calcitRIOL  1.5 mcg Oral Q M,W,F-HD  . Chlorhexidine Gluconate Cloth  6 each Topical Q0600  . Chlorhexidine Gluconate Cloth  6 each Topical Q0600  . ciprofloxacin  500 mg Oral Q breakfast  . ferric citrate  210 mg Oral TID WC  . fluticasone furoate-vilanterol  1 puff Inhalation Daily  . insulin aspart  0-9 Units Subcutaneous TID WC  . insulin glargine  10 Units Subcutaneous Daily  . lacosamide  200 mg Oral 2 times per day on Sun Tue Thu Sat  . lacosamide  200 mg Oral 3 times per day on Mon Wed Fri  . lamoTRIgine  200 mg Oral BID  . metroNIDAZOLE  250 mg Oral Q8H  . midodrine  10 mg Oral BID WC  .  multivitamin  1 tablet Oral QHS  . pantoprazole  40 mg Oral Daily  . sodium chloride flush  3 mL Intravenous Once     Dialysis Orders: MWF -[facility] 4hrs, BFR400, [id number], EDW 83kg,2K/2Ca  Access:RU AVF Heparinnone Mircera18mg q2wks - last 03/14/20 Calcitriol1.5108m PO qHD  Assessment/Plan: 1. Colitis with BRBPR- identified on CT, etiology unclear. FOBT+. Hgb9.2>9.3today.On ABX. Toleratingrenal/carb modifieddiet.Per primary.GI signed off. 2. ESRD- On HD MWF. Next HD today.  3. Hypotension/volume-chronic hypotension.On midodrine. Does not appear volume overloaded.UF to dry as tolerated. 4. Anemiaof CKD- Hgbstable at 9.3.Slowly trending down over last month from 11.5>10.5. ESA just restartedand recently dosed. Follow trends.  5. Secondary Hyperparathyroidism -Ca and phos at goal. Continue binders/VDRA. 6. Nutrition- Renal/carb modifieddiet w/fluid restrictions. 7. Seizures - per primary 8. Hx CVA 9. OSA on CPAP 10. Hx  PE/DVT-s/p IVC filter 11. A fib 12. Multiple myelomacurrently on Velcade and daratumumab 13. Chronic SDH  Ronya [place]crest H. Govanni Plemons NP-C 03/20/2020, 12:28 PM  [phone]

## 2020-03-20 NOTE — Progress Notes (Signed)
DISCHARGE NOTE HOME Isaiah [name] to be discharged Home per MD order. Discussed prescriptions and follow up appointments with the patient. Prescriptions given to patient; medication list explained in detail. Patient verbalized understanding.  Skin clean, dry and intact without evidence of skin break down, no evidence of skin tears noted. IV catheter discontinued intact. Site without signs and symptoms of complications. Dressing and pressure applied. Pt denies pain at the site currently. No complaints noted.  Patient free of lines, drains, and wounds.   An After Visit Summary (AVS) was printed and given to the patient. Patient escorted via wheelchair, and discharged home via private auto.  Orville Govern, RN3

## 2020-03-20 NOTE — Telephone Encounter (Signed)
Transition Care Management Follow-up Telephone Call  Date of discharge and from where: 03/20/2020 from [facility]  How have you been since you were released from the hospital? Okay Any questions or concerns? No Items Reviewed:  Did the pt receive and understand the discharge instructions provided? Yes  Medications obtained and verified? No because the spouse was not home.  Any new allergies since your discharge? No  Dietary orders reviewed?   Do you have support at home? yes  Other (ie: DME, Home Health, etc)   Functional Questionnaire: (I = Independent and D = Dependent)  Bathing/Dressing- D   Meal Prep- D  Eating- D Maintaining continence- D  Transferring/Ambulation- D  Managing Meds- D  Follow up appointments reviewed:    PCP Hospital f/u appt confirmed?Yes scheduled to see Dr.Sanders  on    August 5th at 3 pm.  [facility] f/u appt confirmed? Yes scheduled to see Dr. Michail Sermon in 6 weeks.  Are transportation arrangements needed? No  If their condition worsens, is the pt aware to call  their PCP or go to the ED? Yes  Was the patient provided with contact information for the PCP's office or ED? Yes  Was the pt encouraged to call back with questions or concerns? Yes

## 2020-03-20 NOTE — Plan of Care (Signed)
  Problem: Clinical Measurements: Goal: Ability to maintain clinical measurements within normal limits will improve Outcome: Completed/Met Goal: Will remain free from infection Outcome: Completed/Met Goal: Diagnostic test results will improve Outcome: Completed/Met Goal: Respiratory complications will improve Outcome: Completed/Met Goal: Cardiovascular complication will be avoided Outcome: Completed/Met   Problem: Nutrition: Goal: Adequate nutrition will be maintained Outcome: Completed/Met   Problem: Coping: Goal: Level of anxiety will decrease Outcome: Completed/Met   Problem: Elimination: Goal: Will not experience complications related to bowel motility Outcome: Completed/Met Goal: Will not experience complications related to urinary retention Outcome: Completed/Met   Problem: Safety: Goal: Ability to remain free from injury will improve Outcome: Completed/Met   Problem: Skin Integrity: Goal: Risk for impaired skin integrity will decrease Outcome: Completed/Met   Problem: Education: Goal: Knowledge of disease and its progression will improve Outcome: Completed/Met   Problem: Fluid Volume: Goal: Compliance with measures to maintain balanced fluid volume will improve Outcome: Completed/Met   Problem: Health Behavior/Discharge Planning: Goal: Ability to manage health-related needs will improve Outcome: Completed/Met   Problem: Nutritional: Goal: Ability to make healthy dietary choices will improve Outcome: Completed/Met   Problem: Clinical Measurements: Goal: Complications related to the disease process, condition or treatment will be avoided or minimized Outcome: Completed/Met

## 2020-03-21 ENCOUNTER — Other Ambulatory Visit: Payer: Self-pay | Admitting: *Deleted

## 2020-03-21 ENCOUNTER — Encounter: Payer: Self-pay | Admitting: *Deleted

## 2020-03-21 ENCOUNTER — Other Ambulatory Visit: Payer: Self-pay

## 2020-03-21 DIAGNOSIS — K529 Noninfective gastroenteritis and colitis, unspecified: Secondary | ICD-10-CM | POA: Diagnosis not present

## 2020-03-21 DIAGNOSIS — D509 Iron deficiency anemia, unspecified: Secondary | ICD-10-CM | POA: Diagnosis not present

## 2020-03-21 DIAGNOSIS — Z992 Dependence on renal dialysis: Secondary | ICD-10-CM | POA: Diagnosis not present

## 2020-03-21 DIAGNOSIS — I959 Hypotension, unspecified: Secondary | ICD-10-CM | POA: Diagnosis not present

## 2020-03-21 DIAGNOSIS — N186 End stage renal disease: Secondary | ICD-10-CM | POA: Diagnosis not present

## 2020-03-21 DIAGNOSIS — N2581 Secondary hyperparathyroidism of renal origin: Secondary | ICD-10-CM | POA: Diagnosis not present

## 2020-03-21 DIAGNOSIS — D631 Anemia in chronic kidney disease: Secondary | ICD-10-CM | POA: Diagnosis not present

## 2020-03-21 DIAGNOSIS — E1129 Type 2 diabetes mellitus with other diabetic kidney complication: Secondary | ICD-10-CM | POA: Diagnosis not present

## 2020-03-21 MED ORDER — MIDODRINE HCL 10 MG PO TABS: 10.0000 mg | ORAL_TABLET | Freq: Two times a day (BID) | ORAL | 0 refills | Status: DC

## 2020-03-21 MED FILL — MIDODRINE HCL 10 MG TABLET: 10 | 30 days supply | Qty: 60 | Fill #0

## 2020-03-21 NOTE — Patient Outreach (Signed)
[facility] ([facility] Care Management  03/21/2020  Isaiah [name] 09/01/1966 [id number]  Transition of care call/case closure   Referral received: 03/19/20 Initial outreach:03/21/20 Insurance: [employer]   Subjective: Initial successful telephone call to patient's preferred number in order to complete transition of care assessment; 2 HIPAA identifiers verified. Explained purpose of call to patient wife Isaiah [name], DPR  and completed transition of care assessment.  She states patient is doing a little better She denies noting episodes of diarrhea with blood in stools. She reports patient tolerating diet , but doesn't like food restrictions on diet. Wife discussed patient is currently in hemodialysis session on today. He is tolerating mobility in home with use of walker, has appointment with outpatient neuro physical therapy. Patient spouse is  assisting with his  recovery. She discussed awaiting plans for assessment for CAPS service and  she is followed by [facility] embedded Care Manager and BSW, that has been in  contact her since patient discharge  She discussed patient  ongoing health with Diabetes, COPD and that he is enrolled  one of the [place] chronic disease management programs.  She states that she does have  the hospital indemnity plan and will file claim  She  uses a [facility] outpatient pharmacy [facility] .     Objective:  [name]  was hospitalized at [facility] 7/8-7/13/21  For  Ischemic Colitis, bright red blood per rectum, Hypotension,  Comorbidities include: ESRD, hemodialysis on Monday, Wednesday and Friday, Chronic hypotension, seizure disorder, Multiple myeloma, Diabetes Type 2, Paroxysmal atrial fib, Obstructive sleep Apnea.  He was discharged to home on 03/20/20 without the need for home health services, DME., to resume outpatient neuro rehab.    Assessment:  Patient voices good understanding of all discharge instructions.  See  transition of care flowsheet for assessment details.   Plan:  Reviewed hospital discharge diagnosis of  Colitis, rectal bleeding, hypotension    and discharge treatment plan using hospital discharge instructions, assessing medication adherence, reviewing problems requiring provider notification, and discussing the importance of follow up with surgeon, primary care provider and/or specialists as directed.  Reviewed [place] healthy lifestyle program information to receive discounted premium for  2022   Step 1: Get  your annual physical  Step 2: Complete your health assessment  Step 3:Identify your current health status and complete the corresponding action step between January 1, and May 09, 2020.  Has completed   Using Active Health Management ActiveAdvice View website, verified that patient is an active participate in [place]'s Active Health Management chronic disease management program.    No ongoing care management needs identified, patient is active with [facility] embedded care management team for chronic disease management, will close case to [facility] Care Management services and route successful outreach letter with [facility] Care Management pamphlet and 24 Hour Nurse Line Magnet to [facility] Care Management clinical pool to be mailed to patient's home address.     , RN, BSN  [facility] Care Management,Care Management Coordinator  [phone]- Mobile [phone]- Toll Free Main Office     

## 2020-03-22 LAB — BASIC METABOLIC PANEL
BUN: 41 — AB (ref 4–21)
Creatinine: 10.8 — AB (ref 0.6–1.3)
Glucose: 157

## 2020-03-22 LAB — CBC AND DIFFERENTIAL
HCT: 31 — AB (ref 41–53)
Hemoglobin: 10.4 — AB (ref 13.5–17.5)
Platelets: 421 — AB (ref 150–399)
WBC: 3.1

## 2020-03-22 LAB — IRON,TIBC AND FERRITIN PANEL: UIBC: 123

## 2020-03-22 LAB — HEMOGLOBIN A1C: Hemoglobin A1C: 5

## 2020-03-23 DIAGNOSIS — D509 Iron deficiency anemia, unspecified: Secondary | ICD-10-CM | POA: Diagnosis not present

## 2020-03-23 DIAGNOSIS — E1129 Type 2 diabetes mellitus with other diabetic kidney complication: Secondary | ICD-10-CM | POA: Diagnosis not present

## 2020-03-23 DIAGNOSIS — N186 End stage renal disease: Secondary | ICD-10-CM | POA: Diagnosis not present

## 2020-03-23 DIAGNOSIS — D631 Anemia in chronic kidney disease: Secondary | ICD-10-CM | POA: Diagnosis not present

## 2020-03-23 DIAGNOSIS — N2581 Secondary hyperparathyroidism of renal origin: Secondary | ICD-10-CM | POA: Diagnosis not present

## 2020-03-23 DIAGNOSIS — Z992 Dependence on renal dialysis: Secondary | ICD-10-CM | POA: Diagnosis not present

## 2020-03-26 DIAGNOSIS — N2581 Secondary hyperparathyroidism of renal origin: Secondary | ICD-10-CM | POA: Diagnosis not present

## 2020-03-26 DIAGNOSIS — E109 Type 1 diabetes mellitus without complications: Secondary | ICD-10-CM | POA: Diagnosis not present

## 2020-03-26 DIAGNOSIS — N186 End stage renal disease: Secondary | ICD-10-CM | POA: Diagnosis not present

## 2020-03-26 DIAGNOSIS — E1129 Type 2 diabetes mellitus with other diabetic kidney complication: Secondary | ICD-10-CM | POA: Diagnosis not present

## 2020-03-26 DIAGNOSIS — D631 Anemia in chronic kidney disease: Secondary | ICD-10-CM | POA: Diagnosis not present

## 2020-03-26 DIAGNOSIS — D509 Iron deficiency anemia, unspecified: Secondary | ICD-10-CM | POA: Diagnosis not present

## 2020-03-26 DIAGNOSIS — Z992 Dependence on renal dialysis: Secondary | ICD-10-CM | POA: Diagnosis not present

## 2020-03-27 ENCOUNTER — Inpatient Hospital Stay: Payer: Medicare Other | Attending: Oncology

## 2020-03-27 ENCOUNTER — Other Ambulatory Visit: Payer: Self-pay

## 2020-03-27 ENCOUNTER — Inpatient Hospital Stay (HOSPITAL_BASED_OUTPATIENT_CLINIC_OR_DEPARTMENT_OTHER): Payer: Medicare Other | Admitting: Oncology

## 2020-03-27 ENCOUNTER — Inpatient Hospital Stay: Payer: Medicare Other

## 2020-03-27 VITALS — BP 134/43 | HR 62 | Temp 97.3°F | Resp 18 | Ht 70.0 in | Wt 191.8 lb

## 2020-03-27 DIAGNOSIS — Z5112 Encounter for antineoplastic immunotherapy: Secondary | ICD-10-CM | POA: Insufficient documentation

## 2020-03-27 DIAGNOSIS — C9000 Multiple myeloma not having achieved remission: Secondary | ICD-10-CM | POA: Diagnosis not present

## 2020-03-27 LAB — CBC WITH DIFFERENTIAL (CANCER CENTER ONLY)
Abs Immature Granulocytes: 0.03 10*3/uL (ref 0.00–0.07)
Basophils Absolute: 0.1 10*3/uL (ref 0.0–0.1)
Basophils Relative: 1 %
Eosinophils Absolute: 0.1 10*3/uL (ref 0.0–0.5)
Eosinophils Relative: 1 %
HCT: 29.8 % — ABNORMAL LOW (ref 39.0–52.0)
Hemoglobin: 11 g/dL — ABNORMAL LOW (ref 13.0–17.0)
Immature Granulocytes: 0 %
Lymphocytes Relative: 0 %
Lymphs Abs: 0 10*3/uL — ABNORMAL LOW (ref 0.7–4.0)
MCH: 33 pg (ref 26.0–34.0)
MCHC: 36.9 g/dL — ABNORMAL HIGH (ref 30.0–36.0)
MCV: 89.5 fL (ref 80.0–100.0)
Monocytes Absolute: 0.2 10*3/uL (ref 0.1–1.0)
Monocytes Relative: 2 %
Neutro Abs: 10 10*3/uL — ABNORMAL HIGH (ref 1.7–7.7)
Neutrophils Relative %: 96 %
Platelet Count: 354 10*3/uL (ref 150–400)
RBC: 3.33 MIL/uL — ABNORMAL LOW (ref 4.22–5.81)
RDW: 15.9 % — ABNORMAL HIGH (ref 11.5–15.5)
WBC Count: 10.5 10*3/uL (ref 4.0–10.5)
nRBC: 1.9 % — ABNORMAL HIGH (ref 0.0–0.2)

## 2020-03-27 LAB — CMP (CANCER CENTER ONLY)
ALT: 12 U/L (ref 0–44)
AST: 19 U/L (ref 15–41)
Albumin: 3.9 g/dL (ref 3.5–5.0)
Alkaline Phosphatase: 128 U/L — ABNORMAL HIGH (ref 38–126)
Anion gap: 16 — ABNORMAL HIGH (ref 5–15)
BUN: 44 mg/dL — ABNORMAL HIGH (ref 6–20)
CO2: 30 mmol/L (ref 22–32)
Calcium: 9.6 mg/dL (ref 8.9–10.3)
Chloride: 95 mmol/L — ABNORMAL LOW (ref 98–111)
Creatinine: 8.12 mg/dL (ref 0.61–1.24)
GFR, Est AFR Am: 8 mL/min — ABNORMAL LOW (ref >60)
GFR, Estimated: 7 mL/min — ABNORMAL LOW (ref >60)
Glucose, Bld: 121 mg/dL — ABNORMAL HIGH (ref 70–99)
Potassium: 4.9 mmol/L (ref 3.5–5.1)
Sodium: 141 mmol/L (ref 135–145)
Total Bilirubin: 0.9 mg/dL (ref 0.3–1.2)
Total Protein: 7.2 g/dL (ref 6.5–8.1)

## 2020-03-27 MED ORDER — SODIUM CHLORIDE 0.9 % IV SOLN
Freq: Once | INTRAVENOUS | Status: DC
  Filled 2020-03-27: qty 250

## 2020-03-27 MED ORDER — DARATUMUMAB-HYALURONIDASE-FIHJ 1800-30000 MG-UT/15ML ~~LOC~~ SOLN
1800.0000 mg | Freq: Once | SUBCUTANEOUS | Status: AC
  Administered 2020-03-27: 1800 mg via SUBCUTANEOUS
  Filled 2020-03-27: qty 15

## 2020-03-27 MED ORDER — DIPHENHYDRAMINE HCL 25 MG PO CAPS
50.0000 mg | ORAL_CAPSULE | Freq: Once | ORAL | Status: AC
  Administered 2020-03-27: 50 mg via ORAL

## 2020-03-27 MED ORDER — ACETAMINOPHEN 325 MG PO TABS
ORAL_TABLET | ORAL | Status: AC
  Filled 2020-03-27: qty 2

## 2020-03-27 MED ORDER — DIPHENHYDRAMINE HCL 25 MG PO CAPS
ORAL_CAPSULE | ORAL | Status: AC
  Filled 2020-03-27: qty 2

## 2020-03-27 MED ORDER — ACETAMINOPHEN 325 MG PO TABS
650.0000 mg | ORAL_TABLET | Freq: Once | ORAL | Status: AC
  Administered 2020-03-27: 650 mg via ORAL

## 2020-03-27 NOTE — Patient Instructions (Signed)
[place] Discharge Instructions for Patients Receiving Chemotherapy  Today you received the following chemotherapy agents: Darzalex Faspro  To help prevent nausea and vomiting after your treatment, we encourage you to take your nausea medication as directed.    If you develop nausea and vomiting that is not controlled by your nausea medication, call the clinic.   BELOW ARE SYMPTOMS THAT SHOULD BE REPORTED IMMEDIATELY:  *FEVER GREATER THAN 100.5 F  *CHILLS WITH OR WITHOUT FEVER  NAUSEA AND VOMITING THAT IS NOT CONTROLLED WITH YOUR NAUSEA MEDICATION  *UNUSUAL SHORTNESS OF BREATH  *UNUSUAL BRUISING OR BLEEDING  TENDERNESS IN MOUTH AND THROAT WITH OR WITHOUT PRESENCE OF ULCERS  *URINARY PROBLEMS  *BOWEL PROBLEMS  UNUSUAL RASH Items with * indicate a potential emergency and should be followed up as soon as possible.  Feel free to call the clinic should you have any questions or concerns. The clinic phone number is [phone].  Please show the [place] at check-in to the Emergency Department and triage nurse.

## 2020-03-27 NOTE — Progress Notes (Signed)
Per Venora Maples in pharmacy - OK to wait only 30 minutes after Tylenol and Benadryl and then may proceed with Darzalex - steroids were taken this am at home.

## 2020-03-27 NOTE — Progress Notes (Signed)
Hematology and Oncology Follow Up Visit  [place] [id number] October 10, 1965 54 y.o. 03/27/2020 11:47 AM    Principle Diagnosis: 54 year old man with multiple myeloma diagnosed in 2019 arising from MGUS, IgG kappa subtype in 2016.   Past therapy: Velcade dexamethasone and cyclophosphamide weekly started on 05/25/2018.  Velcade is given at 1.5 mg/m weekly, dexamethasone is 20 mg weekly and and Cytoxan is given at 650 mg total dose.  Last treatment was given in August 2020.    Therapy was held today in anticipation of high-dose chemotherapy and autologous stem cell transplant.  He did not receive stem cell transplant due to subdural hematoma and sepsis in September 2020.  Velcade at 1.5 mg per metered squared subcutaneously weekly, dexamethasone 20 mg weekly, daratumumab 1800 mg subcutaneous injection.   Cycle 1 started on 08/30/2019.  Therapy concluded in June 2021.   Current therapy:   He is currently on daratumumab monthly maintenance.  Interim History:  Mr. Patriarca returns today for a repeat evaluation.  Since the last visit, he was hospitalized between July 8 and March 20, 2020 for ischemic colitis after presenting with abdominal pain and bleeding per rectum associated with low blood pressure episode.  Since his discharge, he is feeling well at this time with no recent complaints of abdominal pain or diarrhea.  He denies any hematochezia.                      Medications: Updated on review..  Current Outpatient Medications:  .  acetaminophen (TYLENOL) 325 MG tablet, Take 1-2 tablets (325-650 mg total) by mouth every 4 (four) hours as needed for mild pain., Disp:  , Rfl:  .  acyclovir (ZOVIRAX) 400 MG tablet, TAKE 1 TABLET BY MOUTH ONCE DAILY (Patient taking differently: Take 400 mg by mouth daily. ), Disp: 90 tablet, Rfl: 0 .  albuterol (PROAIR HFA) 108 (90 Base) MCG/ACT inhaler, Inhale two puffs every 4-6 hours if needed for cough or wheeze (Patient taking differently:  Inhale 2 puffs into the lungs every 4 (four) hours as needed for wheezing or shortness of breath. Inhale two puffs every 4-6 hours if needed for cough or wheeze), Disp: 6.7 g, Rfl: 2 .  Alcohol Swabs (SM ALCOHOL PREP) 70 % PADS, , Disp: , Rfl:  .  atorvastatin (LIPITOR) 20 MG tablet, Take 1 tablet (20 mg total) by mouth daily., Disp: 30 tablet, Rfl: 0 .  AURYXIA 1 GM 210 MG(Fe) tablet, Take 210 mg by mouth 3 (three) times daily., Disp: , Rfl:  .  BREO ELLIPTA 200-25 MCG/INH AEPB, INHALE 1 PUFF BY MOUTH AT THE SAME TIME EVERY DAY (Patient taking differently: Inhale 1 puff into the lungs daily. ), Disp: 60 each, Rfl: 2 .  camphor-menthol (SARNA) lotion, Apply 1 application topically every 8 (eight) hours as needed for itching., Disp: 222 mL, Rfl: 0 .  Cetirizine HCl 10 MG CAPS, Take 1 capsule by mouth daily., Disp: , Rfl:  .  ciprofloxacin (CIPRO) 250 MG tablet, Take 1 tablet (250 mg total) by mouth daily with breakfast., Disp: 3 tablet, Rfl: 0 .  clonazePAM (KLONOPIN) 0.5 MG tablet, Take 2 tablets every Monday, Wednesday, and Friday with hemodialysis (Patient taking differently: Take 1 mg by mouth every Monday, Wednesday, and Friday. Before dialysis), Disp: 30 tablet, Rfl: 5 .  dexamethasone (DECADRON) 4 MG tablet, Take 5 tablets (20 mg total) by mouth once a week. Take 5 tablets (20 mg total) by mouth once a week on day  of chemo. (Patient taking differently: Take 20 mg by mouth every 30 (thirty) days. On the chemo days.), Disp: 40 tablet, Rfl: 3 .  FREESTYLE LITE test strip, , Disp: , Rfl:  .  hydrOXYzine (ATARAX/VISTARIL) 25 MG tablet, Take 1 tablet (25 mg total) by mouth every 8 (eight) hours as needed for itching., Disp: 30 tablet, Rfl: 0 .  Insulin Glargine (BASAGLAR KWIKPEN) 100 UNIT/ML, Inject 10 Units into the skin daily. , Disp: , Rfl:  .  Insulin Lispro (HUMALOG KWIKPEN [place]), Inject 2-4 Units into the skin 3 (three) times daily before meals. Sliding scale: 150-250 =2 units, 250 -350=3 units, 350  += 4 units, Disp: , Rfl:  .  lacosamide (VIMPAT) 200 MG TABS tablet, Take 1 tablet twice a day except on dialysis days M-W-F give 1 tab in AM, 1 tab after dialysis, 1 tab in PM. (Patient taking differently: Take 200 mg by mouth See admin instructions. Take 1 tablet twice a day except on dialysis days M-W-F give 1 tab in AM, 1 tab after dialysis, 1 tab in PM.), Disp: 90 tablet, Rfl: 5 .  lamoTRIgine (LAMICTAL) 200 MG tablet, Take 1 tablet (200 mg total) by mouth 2 (two) times daily., Disp: 60 tablet, Rfl: 11 .  Lancets (FREESTYLE) lancets, , Disp: , Rfl:  .  metroNIDAZOLE (FLAGYL) 250 MG tablet, Take 1 tablet (250 mg total) by mouth every 8 (eight) hours., Disp: 9 tablet, Rfl: 0 .  midodrine (PROAMATINE) 10 MG tablet, Take 1 tablet (10 mg total) by mouth 2 (two) times daily with a meal., Disp: 60 tablet, Rfl: 0 .  multivitamin (RENA-VIT) TABS tablet, Take 1 tablet by mouth at bedtime., Disp: , Rfl:  .  Nutritional Supplements (FEEDING SUPPLEMENT, NEPRO CARB STEADY,) LIQD, Take 237 mLs by mouth 3 (three) times daily as needed (Supplement)., Disp: 237 mL, Rfl: 12 .  pantoprazole (PROTONIX) 40 MG tablet, TAKE 1 TABLET BY MOUTH ONCE DAILY (Patient taking differently: Take 40 mg by mouth daily. ), Disp: 90 tablet, Rfl: 1 .  prochlorperazine (COMPAZINE) 10 MG tablet, TAKE 1 TABLET (10 MG TOTAL) BY MOUTH EVERY 6 (SIX) HOURS AS NEEDED FOR NAUSEA OR VOMITING., Disp: 30 tablet, Rfl: 0 .  UNIFINE PENTIPS 31G X 8 MM MISC, USE AS DIRECTED DAILY AFTER SUPPER, Disp: 100 each, Rfl: 3  Allergies:  Allergies  Allergen Reactions  . Codeine Swelling  . Codeine Rash and Other (See Comments)    Unknown reaction (patient says it was more serious than just a rash, but he can't remember what happened)       Physical Exam:       Blood pressure (!) 134/43, pulse 62, temperature (!) 97.3 F (36.3 C), temperature source Temporal, resp. rate 18, height 5' 10" (1.778 m), weight 191 lb 12.8 oz (87 kg), SpO2 99  %.      ECOG: 1   General appearance: Alert, awake without any distress. Head: Atraumatic without abnormalities Oropharynx: Without any thrush or ulcers. Eyes: No scleral icterus. Lymph nodes: No lymphadenopathy noted in the cervical, supraclavicular, or axillary nodes Heart:regular rate and rhythm, without any murmurs or gallops.   Lung: Clear to auscultation without any rhonchi, wheezes or dullness to percussion. Abdomin: Soft, nontender without any shifting dullness or ascites. Musculoskeletal: No clubbing or cyanosis. Neurological: No motor or sensory deficits. Skin: No rashes or lesions.                  Lab Results: Lab Results  Component Value  Date   WBC 9.6 03/19/2020   HGB 9.2 (L) 03/19/2020   HCT 24.9 (L) 03/19/2020   MCV 87.7 03/19/2020   PLT 356 03/19/2020     Chemistry      Component Value Date/Time   NA 133 (L) 03/19/2020 0353   NA 142 02/15/2020 0000   NA 138 07/03/2017 0813   K 4.3 03/19/2020 0353   K 4.2 07/03/2017 0813   CL 96 (L) 03/19/2020 0353   CO2 24 03/19/2020 0353   CO2 27 07/03/2017 0813   BUN 54 (H) 03/19/2020 0353   BUN 59 (A) 02/15/2020 0000   BUN 46.4 (H) 07/03/2017 0813   CREATININE 12.36 (H) 03/19/2020 0353   CREATININE 7.77 (HH) 03/06/2020 1010   CREATININE 10.9 (HH) 07/03/2017 0813   GLU 134 02/15/2020 0000      Component Value Date/Time   CALCIUM 8.2 (L) 03/19/2020 0353   CALCIUM 9.3 07/03/2017 0813   ALKPHOS 129 (H) 03/15/2020 1631   ALKPHOS 77 07/03/2017 0813   AST 52 (H) 03/15/2020 1631   AST 19 03/06/2020 1010   AST 16 07/03/2017 0813   ALT 28 03/15/2020 1631   ALT 35 03/06/2020 1010   ALT 14 07/03/2017 0813   BILITOT 1.5 (H) 03/15/2020 1631   BILITOT 1.0 03/06/2020 1010   BILITOT 0.93 07/03/2017 0813     Results for KAEVION, SINCLAIR (MRN [id number]) as of 03/27/2020 11:50  Ref. Range 01/10/2020 10:07 02/14/2020 10:08  M Protein SerPl Elph-Mcnc Latest Ref Range: Not Observed g/dL 0.2 (H) Not Observed   IFE 1 Unknown Comment (A) Comment (A)  Globulin, Total Latest Ref Range: 2.2 - 3.9 g/dL 2.5 2.7  B-Globulin SerPl Elph-Mcnc Latest Ref Range: 0.7 - 1.3 g/dL 0.8 0.9  IgG (Immunoglobin G), Serum Latest Ref Range: 603 - 1,613 mg/dL 726 648  IgM (Immunoglobulin M), Srm Latest Ref Range: 20 - 172 mg/dL 18 (L) 18 (L)  IgA Latest Ref Range: 90 - 386 mg/dL 23 (L) 26 (L)       Impression and Plan:  54 year old man with:  1.  Multiple myeloma diagnosed in 2019.  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   He is status post therapy outlined above and currently on maintenance daratumumab without any major complications.  Risks and benefits of continuing this approach was discussed today.  Protein studies obtained on February 14, 2020 personally reviewed continues to show complete response without any detectable M spike.  His serum light chain also continues stable with declining ratio.   Risks and benefits of continuing monthly daratumumab were reviewed and he is agreeable to continue at this time.   2. Anemia: Related to his recent colitis and hematochezia.  Hemoglobin is improving.  No need for transfusion or growth factor support.   3.  Antiemetics: Antiemetics are available to him without any recent nausea or vomiting.  4.  Renal failure: Currently on dialysis without any major complications.  5. Goals of care: His disease is unlikely to be curable but aggressive measures are warranted at this time.   6. Follow-up: In 4 weeks for the next daratumumab treatment.   30 minutes were dedicated to this visit.  The time was spent on reviewing his disease status, discussing treatment options, reviewing laboratory data and future plan of care review.    Zola Button, MD 7/20/202111:47 AM

## 2020-03-28 DIAGNOSIS — N2581 Secondary hyperparathyroidism of renal origin: Secondary | ICD-10-CM | POA: Diagnosis not present

## 2020-03-28 DIAGNOSIS — D631 Anemia in chronic kidney disease: Secondary | ICD-10-CM | POA: Diagnosis not present

## 2020-03-28 DIAGNOSIS — Z992 Dependence on renal dialysis: Secondary | ICD-10-CM | POA: Diagnosis not present

## 2020-03-28 DIAGNOSIS — D509 Iron deficiency anemia, unspecified: Secondary | ICD-10-CM | POA: Diagnosis not present

## 2020-03-28 DIAGNOSIS — E1129 Type 2 diabetes mellitus with other diabetic kidney complication: Secondary | ICD-10-CM | POA: Diagnosis not present

## 2020-03-28 DIAGNOSIS — N186 End stage renal disease: Secondary | ICD-10-CM | POA: Diagnosis not present

## 2020-03-28 LAB — KAPPA/LAMBDA LIGHT CHAINS
Kappa free light chain: 123.7 mg/L — ABNORMAL HIGH (ref 3.3–19.4)
Kappa, lambda light chain ratio: 2.48 — ABNORMAL HIGH (ref 0.26–1.65)
Lambda free light chains: 49.9 mg/L — ABNORMAL HIGH (ref 5.7–26.3)

## 2020-03-29 ENCOUNTER — Other Ambulatory Visit: Payer: Self-pay

## 2020-03-29 ENCOUNTER — Encounter: Payer: Medicare Other | Admitting: Internal Medicine

## 2020-03-29 ENCOUNTER — Ambulatory Visit: Payer: 59

## 2020-03-29 ENCOUNTER — Ambulatory Visit: Payer: Medicare Other | Attending: Physical Medicine & Rehabilitation | Admitting: Physical Therapy

## 2020-03-29 DIAGNOSIS — R2681 Unsteadiness on feet: Secondary | ICD-10-CM | POA: Diagnosis not present

## 2020-03-29 DIAGNOSIS — M6281 Muscle weakness (generalized): Secondary | ICD-10-CM | POA: Insufficient documentation

## 2020-03-29 DIAGNOSIS — R2689 Other abnormalities of gait and mobility: Secondary | ICD-10-CM | POA: Diagnosis not present

## 2020-03-29 NOTE — Therapy (Signed)
[facility] [address] [place], [place], [address] Phone: [phone]   Fax:  [phone]  Physical Therapy Evaluation  Patient Details  Name: Isaiah [name] [facility] MRN: [id number] Date of Birth: October 10, 1965 Referring Provider (PT): Delice Lesch, MD   Encounter Date: 03/29/2020   PT End of Session - 03/29/20 1337    Visit Number 1    Number of Visits 17    Date for PT Re-Evaluation 67/54/49   90 day cert for 8-wk POC   Authorization Type Medicare    Progress Note Due on Visit 10    PT Start Time 0932    PT Stop Time 1015    PT Time Calculation (min) 43 min    Equipment Utilized During Treatment Gait belt    Activity Tolerance Patient tolerated treatment well    Behavior During Therapy [facility] for tasks assessed/performed           Past Medical History:  Diagnosis Date  . A-fib ([place])   . Anemia   . Asthma   . DM type 2 (diabetes mellitus, type 2) ([place]) 06/09/2019  . ESRD (end stage renal disease) on dialysis ([place])   . Essential hypertension 06/09/2019  . GIB (gastrointestinal bleeding)    Recurrent episodes- 09/2014, 09/2015 and 05/2016  . Gout   . History of recent blood transfusion 10/27/14   2 Units PRBC's  . Hyperkalemia 07/2011  . Multiple myeloma ([place])   . OSA on CPAP   . Pulmonary embolism ([place]) 07/2011; 09/27/2014   a. Bilat PE 07/2011 - unclear cause, tx with 6 months Coumadin.;   . Seizure disorder ([place]) 06/09/2019  . Sepsis ([place])   . Sickle cell-thalassemia disease ([place])    a. Sickle cell trait.  . Sleep apnea   . Stroke ([place]) 09/2015   R-MCA, L-MCA, PCA and bilateral cerebellar complicated by DVT/PE  . Subdural hematoma ([place]) 05/2019    Past Surgical History:  Procedure Laterality Date  . AV FISTULA PLACEMENT Right 12/05/2015   Procedure: INSERTION OF ARTERIOVENOUS (AV) GORE-TEX GRAFT ARM;  Surgeon: Elam Dutch, MD;  Location: [place];  Service: Vascular;  Laterality: Right;  . BONE MARROW BIOPSY     . CATARACT EXTRACTION  08/2019  . CATARACT EXTRACTION  09/2019  . CHOLECYSTECTOMY    . CHOLECYSTECTOMY  1990's?  . COLONOSCOPY WITH PROPOFOL N/A 01/22/2017   Procedure: COLONOSCOPY WITH PROPOFOL;  Surgeon: Wilford Corner, MD;  Location: [facility];  Service: Endoscopy;  Laterality: N/A;  . EYE SURGERY Right   . INSERTION OF DIALYSIS CATHETER Right 10/16/2015   Procedure: INSERTION OF PALINDROME DIALYSIS CATHETER ;  Surgeon: Elam Dutch, MD;  Location: [facility];  Service: Vascular;  Laterality: Right;  . OTHER SURGICAL HISTORY     Retinal surgery  . PARS PLANA VITRECTOMY  02/17/2012   Procedure: PARS PLANA VITRECTOMY WITH 25 GAUGE;  Surgeon: Hayden Pedro, MD;  Location: [facility] [place];  Service: Ophthalmology;  Laterality: Right;  . PERIPHERAL VASCULAR CATHETERIZATION N/A 03/20/2016   Procedure: A/V Shuntogram/Fistulagram;  Surgeon: Conrad Bonaparte, MD;  Location: [facility];  Service: Cardiovascular;  Laterality: N/A;  . TEE WITHOUT CARDIOVERSION N/A 12/02/2016   Procedure: TRANSESOPHAGEAL ECHOCARDIOGRAM (TEE);  Surgeon: Larey Dresser, MD;  Location: [facility];  Service: Cardiovascular;  Laterality: N/A;    There were no vitals filed for this visit.    Subjective Assessment - 03/29/20 0939    Subjective Pt reports R hand is weak  and R leg is weak.  Before he went to [facility], he was able to get around the house independently (no walker); attempted bone/stem cell transplant at [facility], and had another SDH and seizure.  He had home health PT and OT, and they discharged 12/29/19; he cannot walk without walker, he tends to lean to R and sometimes forward.  Some days better than others.  Has fallen several times.  Has exercise bike at home and goes up and down steps.    Patient is accompained by: Family member   wife, provides history   Pertinent History multiple myeloma, ESRD on dialysis, DM type II, DVT with IVC filter, CVA, COPD, a-fib; chemo treatments down to 1x/month (next is August 17th)      Patient Stated Goals To get back to walk without the walker, back to his baseline    Currently in Pain? No/denies              [facility] PT Assessment - 03/29/20 0944      Assessment   Medical Diagnosis spastic hemiparesis, SDH, abnormality of gait    Referring Provider (PT) Ankit Patel, MD    Onset Date/Surgical Date 12/29/19   ended HHPT, 03/08/2020 MD visit   Hand Dominance Right      Precautions   Precautions Fall    Precaution Comments RUE fistula-dialysis; hx of seizures      Balance Screen   Has the patient fallen in the past 6 months Yes    How many times? 2    Has the patient had a decrease in activity level because of a fear of falling?  No    Is the patient reluctant to leave their home because of a fear of falling?  No      Home Environment   Living Environment Private residence    Living Arrangements Spouse/significant other   Wife works during pt's dialysis; works from home   Available Help at Discharge Family    Type of Home House    Home Access Stairs to enter    Entrance Stairs-Number of Steps 1 low step-uses RW    Home Layout Two level;Bed/bath upstairs    Alternate Level Stairs-Number of Steps 12    Alternate Level Stairs-Rails Right;Left    Home Equipment Walker - 2 wheels;Bedside commode;Shower seat;Transport chair;Grab bars - tub/shower      Prior Function   Level of Independence Independent      Observation/Other Assessments   Focus on Therapeutic Outcomes (FOTO)  NA      ROM / Strength   AROM / PROM / Strength AROM;Strength      AROM   Overall AROM  Deficits      Strength   Overall Strength Deficits    Strength Assessment Site Hip;Knee;Ankle    Right/Left Hip Right;Left    Right Hip Flexion 3+/5    Left Hip Flexion 4+/5    Right/Left Knee Right;Left    Right Knee Flexion 3+/5    Right Knee Extension 3+/5    Left Knee Flexion 4+/5    Left Knee Extension 4+/5    Right/Left Ankle Right;Left    Right Ankle Dorsiflexion 4/5    Left Ankle  Dorsiflexion 5/5      Transfers   Transfers Sit to Stand;Stand to Sit    Sit to Stand 4: Min guard;With upper extremity assist;From chair/3-in-1    Stand to Sit 4: Min guard;With upper extremity assist;To chair/3-in-1    Comments Some days, pt reports   he needs assistance to get up      Ambulation/Gait   Ambulation/Gait Yes    Ambulation/Gait Assistance 4: Min guard    Ambulation Distance (Feet) 50 Feet   x 2   Assistive device Rolling walker    Gait Pattern Step-through pattern;Ataxic;Trunk flexed;Poor foot clearance - left;Poor foot clearance - right    Ambulation Surface Level;Indoor    Gait velocity 33.78 sec = 0.97 ft/sec    Gait Comments Unable to assess steps on eval (being used while pt/PT out in gym); pt and wife report  step to pattern ascending, step-through pattern descending B handrail with wife supervision.      Standardized Balance Assessment   Standardized Balance Assessment Berg Balance Test;Timed Up and Go Test      Berg Balance Test   Sit to Stand Able to stand  independently using hands    Standing Unsupported Able to stand 30 seconds unsupported   54 seconds, 36 seconds   Sitting with Back Unsupported but Feet Supported on Floor or Stool Able to sit safely and securely 2 minutes    Stand to Sit Controls descent by using hands    Transfers Needs one person to assist    Standing Unsupported with Eyes Closed Needs help to keep from falling    Standing Unsupported with Feet Together Needs help to attain position and unable to hold for 15 seconds    From Standing, Reach Forward with Outstretched Arm Loses balance while trying/requires external support    From Standing Position, Pick up Object from Floor Unable to try/needs assist to keep balance    From Standing Position, Turn to Look Behind Over each Shoulder Needs assist to keep from losing balance and falling    Turn 360 Degrees Needs assistance while turning    Standing Unsupported, Alternately Place Feet on  Step/Stool Needs assistance to keep from falling or unable to try    Standing Unsupported, One Foot in Front Loses balance while stepping or standing    Standing on One Leg Unable to try or needs assist to prevent fall    Total Score 13    Berg comment: Scores <45/56 indicate increased fall risk.  Pt has difficulty with single limb stance activities)      Timed Up and Go Test   TUG Normal TUG    Normal TUG (seconds) 52.25    TUG Comments Scores >13.5 sec indicate increased fall risk; >30 seconds indicate difficulty with ADLs in the home.  PT provides min guard support throughout.                      Objective measurements completed on examination: See above findings.               PT Education - 03/29/20 1336    Education Details Eval results, POC    Person(s) Educated Patient;Spouse    Methods Explanation    Comprehension Verbalized understanding            PT Short Term Goals - 03/29/20 1344      PT SHORT TERM GOAL #1   Title Pt will perform HEP independenlty for improved strength, balance and gait.  TARGET 04/27/2020    Time 4    Period Weeks    Status New      PT SHORT TERM GOAL #2   Title Pt will improve Berg Score to at least 23/56 for decreased fall risk.    Baseline 13/56 03/29/2020      Time 4    Period Weeks    Status New      PT SHORT TERM GOAL #3   Title Pt will improve TUG score to less than or equal to 40 seconds for decreased fall risk.    Baseline 52.25 sec 03/29/2020    Time 4    Period Weeks    Status New      PT SHORT TERM GOAL #4   Title Pt will perform at least 3 minutes of standing with UE support and supervision for imrpoved standing tolerance for participation in ADLs.    Baseline 54 sec, 36 sec min guard/close supervision    Time 4    Period Weeks    Status New      PT SHORT TERM GOAL #5   Title Pt will improve gait velocity to at least 1.3 ft/sec for improved gait efficiency and safety.    Time 4    Period Weeks      Status New      Additional Short Term Goals   Additional Short Term Goals Yes      PT SHORT TERM GOAL #6   Title Pt/wife will verbalize understanding of fall prevention in home environment.    Time 4    Period Weeks    Status New             PT Long Term Goals - 03/29/20 1348      PT LONG TERM GOAL #1   Title Pt will be independent with progression of HEP for improved strength, blaance, gait.  TARGET 05/25/2020    Time 8    Period Weeks    Status New      PT LONG TERM GOAL #2   Title Pt will imrpove Berg Balance score to at least 33/56 for decreased fall risk.    Time 8    Period Weeks    Status New      PT LONG TERM GOAL #3   Title Pt will improve TUG score to less than or equal to 30 seconds for decreased fall risk.    Time 8    Period Weeks    Status New      PT LONG TERM GOAL #4   Title Pt will improve gait velocity to at least 1.8 ft/sec for improved gait velocity and decreased fall risk.    Time 8    Period Weeks    Status New      PT LONG TERM GOAL #5   Title Pt will ascend/descent 12 steps with both handrails with supervision only, to negotiate steps at home.    Time 8    Period Weeks    Status New                  Plan - 03/29/20 1338    Clinical Impression Statement Pt is a 54 year old male who presents to OPPT with history of multiple myeloma (currently undergoing chemo treatment), ESRD on dialysis, and hsitory of SDH x 2.  Pt was preparing to undergo stem cell/bone marrow transplant at [facility] (late 2020), when he had a second SDH, increased R sided weakness.  He was independent prior to this incident.  He presents to OPPT today with decreased balance, decreased strength (lower extremity and trunk), decreased independence with transfers and gait, decreased safety awareness.  He is at high fall risk per Berg, TUG and gait velocity scores.  He has had 2 falls in past 6   months.  He will benefit from skilled PT to address the above stated deficits to  decrease fall risk and improve overall mobility and independence.    Personal Factors and Comorbidities Comorbidity 3+    Comorbidities PMH:  multiple myeloma, ESRD on dialysis, DM type II, DVT with IVC filter, CVA, COPD, a-fib    Examination-Activity Limitations Locomotion Level;Transfers;Stairs;Stand    Examination-Participation Restrictions Community Activity    Stability/Clinical Decision Making Evolving/Moderate complexity    Clinical Decision Making Moderate    Rehab Potential Good    PT Frequency 2x / week    PT Duration 8 weeks   plus eval   PT Treatment/Interventions ADLs/Self Care Home Management;DME Instruction;Neuromuscular re-education;Balance training;Therapeutic exercise;Therapeutic activities;Functional mobility training;Stair training;Gait training;Patient/family education    PT Next Visit Plan Initiate HEP for strength, balance, trunk control; gait training, use of aerobic machines for strengthening and activity tolerance    Consulted and Agree with Plan of Care Patient;Family member/caregiver    Family Member Consulted wife           Patient will benefit from skilled therapeutic intervention in order to improve the following deficits and impairments:  Abnormal gait, Difficulty walking, Decreased safety awareness, Decreased balance, Decreased mobility, Decreased strength  Visit Diagnosis: Other abnormalities of gait and mobility  Unsteadiness on feet  Muscle weakness (generalized)     Problem List Patient Active Problem List   Diagnosis Date Noted  . Colitis 03/15/2020  . AMS (altered mental status) 10/05/2019  . Acute metabolic encephalopathy 10/04/2019  . Headache, unspecified 09/28/2019  . Abnormality of gait 09/13/2019  . Diabetes mellitus with end-stage renal disease (HCC) 08/07/2019  . Benign hypertensive kidney disease with chronic kidney disease 08/07/2019  . Hypothyroidism, unspecified 07/15/2019  . Spastic hemiparesis (HCC) 07/12/2019  .  Dysphagia, oropharyngeal phase 07/05/2019  . Allergy, unspecified, initial encounter 06/27/2019  . PAF (paroxysmal atrial fibrillation) (HCC)   . Bacteremia   . Labile blood pressure   . Labile blood glucose   . Controlled type 2 diabetes mellitus with hyperglycemia, without long-term current use of insulin (HCC)   . ESRD on dialysis (HCC)   . Right sided weakness   . SDH (subdural hematoma) (HCC)   . Chronic intracranial subdural hematoma (HCC) 06/09/2019  . Multiple myeloma not having achieved remission (HCC) 06/09/2019  . ESRD on hemodialysis (HCC) 06/09/2019  . Essential hypertension 06/09/2019  . DM type 2 (diabetes mellitus, type 2) (HCC) 06/09/2019  . Seizure disorder (HCC) 06/09/2019  . Aspiration pneumonia (HCC) 06/09/2019  . History of bacteremia 06/09/2019  . Atrial fibrillation, chronic (HCC) 06/09/2019  . Bilateral kidney stones 05/31/2019  . Proteus infection 05/28/2019  . History of restrictive pulmonary disease 04/07/2019  . Multiple myeloma (HCC) 05/17/2018  . Goals of care, counseling/discussion 05/17/2018  . Partial idiopathic epilepsy with seizures of localized onset, intractable, without status epilepticus (HCC) 11/02/2017  . Iron deficiency anemia, unspecified 01/22/2017  . COPD (chronic obstructive pulmonary disease) (HCC) 11/11/2016  . CVA (cerebral vascular accident) (HCC) 11/11/2016  . GERD (gastroesophageal reflux disease) 11/11/2016  . GI bleed 11/11/2016  . 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  . Obesity (BMI 30-39.9) 11/11/2016  . Pulmonary hypertension (HCC) 11/11/2016  . Patent foramen ovale 11/11/2016  . Hyperparathyroidism, secondary renal (HCC) 11/11/2016  . VTE (venous thromboembolism) 11/11/2016  . Hypotension 11/11/2016  . Systemic hypertension 11/11/2016  . Seizure (HCC) 11/11/2016  . Other seizures (HCC) 07/25/2016  . Encounter for removal of sutures 05/27/2016  . Ingrown nail 03/03/2016  .   Onychomycosis due to dermatophyte  03/03/2016  . Other acquired hammer toe 03/03/2016  . Hemiparesis affecting right side as late effect of cerebrovascular accident ([place]) 02/28/2016  . Aphasia complicating stroke 70/26/3785  . Seizures ([place]) 01/08/2016  . History of ischemic left MCA stroke 01/08/2016  . Right sided weakness 01/08/2016  . Atrial fibrillation ([place]) 01/08/2016  . Leukocytosis 01/08/2016  . Benign essential HTN 01/08/2016  . Anemia of chronic disease 01/08/2016  . Controlled type 2 diabetes mellitus with diabetic nephropathy, without long-term current use of insulin ([place]) 01/08/2016  . History of DVT (deep vein thrombosis) 01/08/2016  . 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 Hypocalcemia 12/26/2015  . Long term (current) use of anticoagulants 11/07/2015  . Aftercare including intermittent dialysis ([place]) 11/02/2015  . Other specified coagulation defects ([place]) 11/02/2015  . Complication of vascular dialysis catheter 11/02/2015  . Diarrhea, unspecified 11/02/2015  . Fever, unspecified 11/02/2015  . Pruritus, unspecified 11/02/2015  . Shortness of breath 11/02/2015  . Type 2 diabetes mellitus with diabetic peripheral angiopathy without gangrene ([place]) 11/02/2015  . ESRD (end stage renal disease) on dialysis ([place])     Rosamund Nyland W. 03/29/2020, 1:51 PM Frazier Butt., PT  [facility] [address] [place], [place], [address] Phone: [phone]   Fax:  [phone]  Name: Isaiah [name] [name] MRN: [id number] Date of Birth: 1966/02/12

## 2020-03-30 DIAGNOSIS — D509 Iron deficiency anemia, unspecified: Secondary | ICD-10-CM | POA: Diagnosis not present

## 2020-03-30 DIAGNOSIS — N2581 Secondary hyperparathyroidism of renal origin: Secondary | ICD-10-CM | POA: Diagnosis not present

## 2020-03-30 DIAGNOSIS — Z992 Dependence on renal dialysis: Secondary | ICD-10-CM | POA: Diagnosis not present

## 2020-03-30 DIAGNOSIS — D631 Anemia in chronic kidney disease: Secondary | ICD-10-CM | POA: Diagnosis not present

## 2020-03-30 DIAGNOSIS — E1129 Type 2 diabetes mellitus with other diabetic kidney complication: Secondary | ICD-10-CM | POA: Diagnosis not present

## 2020-03-30 DIAGNOSIS — N186 End stage renal disease: Secondary | ICD-10-CM | POA: Diagnosis not present

## 2020-03-30 LAB — MULTIPLE MYELOMA PANEL, SERUM
Albumin SerPl Elph-Mcnc: 3.7 g/dL (ref 2.9–4.4)
Albumin/Glob SerPl: 1.6 (ref 0.7–1.7)
Alpha 1: 0.3 g/dL (ref 0.0–0.4)
Alpha2 Glob SerPl Elph-Mcnc: 0.7 g/dL (ref 0.4–1.0)
B-Globulin SerPl Elph-Mcnc: 0.7 g/dL (ref 0.7–1.3)
Gamma Glob SerPl Elph-Mcnc: 0.6 g/dL (ref 0.4–1.8)
Globulin, Total: 2.4 g/dL (ref 2.2–3.9)
IgA: 36 mg/dL — ABNORMAL LOW (ref 90–386)
IgG (Immunoglobin G), Serum: 585 mg/dL — ABNORMAL LOW (ref 603–1613)
IgM (Immunoglobulin M), Srm: 21 mg/dL (ref 20–172)
Total Protein ELP: 6.1 g/dL (ref 6.0–8.5)

## 2020-04-02 ENCOUNTER — Telehealth: Payer: Self-pay | Admitting: Oncology

## 2020-04-02 ENCOUNTER — Other Ambulatory Visit (HOSPITAL_COMMUNITY): Payer: Self-pay | Admitting: Nephrology

## 2020-04-02 DIAGNOSIS — N186 End stage renal disease: Secondary | ICD-10-CM | POA: Diagnosis not present

## 2020-04-02 DIAGNOSIS — D509 Iron deficiency anemia, unspecified: Secondary | ICD-10-CM | POA: Diagnosis not present

## 2020-04-02 DIAGNOSIS — Z992 Dependence on renal dialysis: Secondary | ICD-10-CM | POA: Diagnosis not present

## 2020-04-02 DIAGNOSIS — E1129 Type 2 diabetes mellitus with other diabetic kidney complication: Secondary | ICD-10-CM | POA: Diagnosis not present

## 2020-04-02 DIAGNOSIS — D631 Anemia in chronic kidney disease: Secondary | ICD-10-CM | POA: Diagnosis not present

## 2020-04-02 DIAGNOSIS — N2581 Secondary hyperparathyroidism of renal origin: Secondary | ICD-10-CM | POA: Diagnosis not present

## 2020-04-02 MED FILL — CALCIUM ACETATE 667 MG CAP: 667 | 30 days supply | Qty: 390 | Fill #0

## 2020-04-02 NOTE — Telephone Encounter (Signed)
Scheduled per 07/20 los, spoke with patient's wife and patient will be notified.

## 2020-04-03 DIAGNOSIS — H209 Unspecified iridocyclitis: Secondary | ICD-10-CM | POA: Diagnosis not present

## 2020-04-03 MED FILL — PREDNISOLONE AC 1% EYE DROP: 1 | 12 days supply | Qty: 15 | Fill #0

## 2020-04-03 MED FILL — BRIMONIDINE TARTRATE 0.2 %: 0.2 | 50 days supply | Qty: 5 | Fill #0

## 2020-04-04 DIAGNOSIS — D631 Anemia in chronic kidney disease: Secondary | ICD-10-CM | POA: Diagnosis not present

## 2020-04-04 DIAGNOSIS — D509 Iron deficiency anemia, unspecified: Secondary | ICD-10-CM | POA: Diagnosis not present

## 2020-04-04 DIAGNOSIS — E1129 Type 2 diabetes mellitus with other diabetic kidney complication: Secondary | ICD-10-CM | POA: Diagnosis not present

## 2020-04-04 DIAGNOSIS — Z992 Dependence on renal dialysis: Secondary | ICD-10-CM | POA: Diagnosis not present

## 2020-04-04 DIAGNOSIS — N2581 Secondary hyperparathyroidism of renal origin: Secondary | ICD-10-CM | POA: Diagnosis not present

## 2020-04-04 DIAGNOSIS — N186 End stage renal disease: Secondary | ICD-10-CM | POA: Diagnosis not present

## 2020-04-05 ENCOUNTER — Ambulatory Visit (INDEPENDENT_AMBULATORY_CARE_PROVIDER_SITE_OTHER): Payer: Medicare Other | Admitting: Ophthalmology

## 2020-04-05 ENCOUNTER — Ambulatory Visit: Payer: Medicare Other

## 2020-04-05 ENCOUNTER — Encounter (INDEPENDENT_AMBULATORY_CARE_PROVIDER_SITE_OTHER): Payer: Self-pay | Admitting: Ophthalmology

## 2020-04-05 ENCOUNTER — Other Ambulatory Visit: Payer: Self-pay

## 2020-04-05 DIAGNOSIS — H04123 Dry eye syndrome of bilateral lacrimal glands: Secondary | ICD-10-CM

## 2020-04-05 DIAGNOSIS — H3581 Retinal edema: Secondary | ICD-10-CM

## 2020-04-05 DIAGNOSIS — H36 Retinal disorders in diseases classified elsewhere: Secondary | ICD-10-CM

## 2020-04-05 DIAGNOSIS — H4311 Vitreous hemorrhage, right eye: Secondary | ICD-10-CM

## 2020-04-05 DIAGNOSIS — Z961 Presence of intraocular lens: Secondary | ICD-10-CM | POA: Diagnosis not present

## 2020-04-05 DIAGNOSIS — H2101 Hyphema, right eye: Secondary | ICD-10-CM | POA: Diagnosis not present

## 2020-04-05 DIAGNOSIS — N186 End stage renal disease: Secondary | ICD-10-CM

## 2020-04-05 DIAGNOSIS — I10 Essential (primary) hypertension: Secondary | ICD-10-CM

## 2020-04-05 DIAGNOSIS — E113593 Type 2 diabetes mellitus with proliferative diabetic retinopathy without macular edema, bilateral: Secondary | ICD-10-CM | POA: Diagnosis not present

## 2020-04-05 DIAGNOSIS — H3689 Sickle-cell disease without crisis: Secondary | ICD-10-CM

## 2020-04-05 DIAGNOSIS — H35033 Hypertensive retinopathy, bilateral: Secondary | ICD-10-CM | POA: Diagnosis not present

## 2020-04-05 DIAGNOSIS — D571 Sickle-cell disease without crisis: Secondary | ICD-10-CM | POA: Diagnosis not present

## 2020-04-05 DIAGNOSIS — C9000 Multiple myeloma not having achieved remission: Secondary | ICD-10-CM

## 2020-04-05 NOTE — Chronic Care Management (AMB) (Signed)
Chronic Care Management    Social Work Follow Up Note  04/05/2020 Name: Isaiah [name] [facility] MRN: [id number] DOB: [birth year]  Isaiah [name] is a 54 y.o. year old male who is a primary care patient of [name], MD. The CCM team was consulted for assistance with care coordination.   Review of patient status, including review of consultants reports, other relevant assessments, and collaboration with appropriate care team members and the patient's provider was performed as part of comprehensive patient evaluation and provision of chronic care management services.    SDOH (Social Determinants of Health) assessments performed: No    Outpatient Encounter Medications as of 04/05/2020  Medication Sig  . acetaminophen (TYLENOL) 325 MG tablet Take 1-2 tablets (325-650 mg total) by mouth every 4 (four) hours as needed for mild pain.  [name] acyclovir (ZOVIRAX) 400 MG tablet TAKE 1 TABLET BY MOUTH ONCE DAILY (Patient not taking: Reported on 04/05/2020)  . albuterol (PROAIR HFA) 108 (90 Base) MCG/ACT inhaler Inhale two puffs every 4-6 hours if needed for cough or wheeze (Patient taking differently: Inhale 2 puffs into the lungs every 4 (four) hours as needed for wheezing or shortness of breath. Inhale two puffs every 4-6 hours if needed for cough or wheeze)  . Alcohol Swabs (SM ALCOHOL PREP) 70 % PADS   . atorvastatin (LIPITOR) 20 MG tablet Take 1 tablet (20 mg total) by mouth daily.  Lorin Picket 1 GM 210 MG(Fe) tablet Take 210 mg by mouth 3 (three) times daily.  [name] BREO ELLIPTA 200-25 MCG/INH AEPB INHALE 1 PUFF BY MOUTH AT THE SAME TIME EVERY DAY (Patient taking differently: Inhale 1 puff into the lungs daily. )  . brimonidine (ALPHAGAN) 0.2 % ophthalmic solution Place 1 drop into the right eye 2 (two) times daily.  . camphor-menthol (SARNA) lotion Apply 1 application topically every 8 (eight) hours as needed for itching.  . Cetirizine HCl 10 MG CAPS Take 1 capsule by mouth daily. (Patient not taking: Reported on  04/05/2020)  . ciprofloxacin (CIPRO) 250 MG tablet Take 1 tablet (250 mg total) by mouth daily with breakfast. (Patient not taking: Reported on 03/29/2020)  . clonazePAM (KLONOPIN) 0.5 MG tablet Take 2 tablets every Monday, Wednesday, and Friday with hemodialysis (Patient taking differently: Take 1 mg by mouth every Monday, Wednesday, and Friday. Before dialysis)  . dexamethasone (DECADRON) 4 MG tablet Take 5 tablets (20 mg total) by mouth once a week. Take 5 tablets (20 mg total) by mouth once a week on day of chemo. (Patient not taking: Reported on 03/29/2020)  . FREESTYLE LITE test strip   . hydrOXYzine (ATARAX/VISTARIL) 25 MG tablet Take 1 tablet (25 mg total) by mouth every 8 (eight) hours as needed for itching.  . Insulin Glargine (BASAGLAR KWIKPEN) 100 UNIT/ML Inject 10 Units into the skin daily.   . Insulin Lispro (HUMALOG KWIKPEN Victor) Inject 2-4 Units into the skin 3 (three) times daily before meals. Sliding scale: 150-250 =2 units, 250 -350=3 units, 350 += 4 units  . lacosamide (VIMPAT) 200 MG TABS tablet Take 1 tablet twice a day except on dialysis days M-W-F give 1 tab in AM, 1 tab after dialysis, 1 tab in PM. (Patient taking differently: Take 200 mg by mouth See admin instructions. Take 1 tablet twice a day except on dialysis days M-W-F give 1 tab in AM, 1 tab after dialysis, 1 tab in PM.)  . lamoTRIgine (LAMICTAL) 200 MG tablet Take 1 tablet (200 mg total) by mouth 2 (two) times  daily.  . Lancets (FREESTYLE) lancets   . metoprolol tartrate (LOPRESSOR) 25 MG tablet Take 25 mg by mouth daily.  . metroNIDAZOLE (FLAGYL) 250 MG tablet Take 1 tablet (250 mg total) by mouth every 8 (eight) hours. (Patient not taking: Reported on 03/29/2020)  . midodrine (PROAMATINE) 10 MG tablet Take 1 tablet (10 mg total) by mouth 2 (two) times daily with a meal.  . multivitamin (RENA-VIT) TABS tablet Take 1 tablet by mouth at bedtime.  . Nutritional Supplements (FEEDING SUPPLEMENT, NEPRO CARB STEADY,) LIQD Take  237 mLs by mouth 3 (three) times daily as needed (Supplement).  . pantoprazole (PROTONIX) 40 MG tablet TAKE 1 TABLET BY MOUTH ONCE DAILY (Patient taking differently: Take 40 mg by mouth daily. )  . prednisoLONE acetate (PRED FORTE) 1 % ophthalmic suspension Place 1 drop into the right eye every hour while awake.  . prochlorperazine (COMPAZINE) 10 MG tablet TAKE 1 TABLET (10 MG TOTAL) BY MOUTH EVERY 6 (SIX) HOURS AS NEEDED FOR NAUSEA OR VOMITING.  [name] UNIFINE PENTIPS 31G X 8 MM MISC USE AS DIRECTED DAILY AFTER SUPPER   No facility-administered encounter medications on file as of 04/05/2020.     Goals Addressed            This Visit's Progress   . Collaborate with RN Care Manager to perform apprpriate assessments to determine care management and care coordination needs       Current Barriers:  . Care management and care coordination needs related to DMII, ESRD, and multiple myeloma  which resulted in inpatient hospitalization . ADL IADL limitations  Clinical Social Work Clinical Goal(s):  [name] Over the next 60 days, patient will collaborate with care management team to assist with care coordination needs in response to recent inpatient hospitalization . New 01/16/20- Over the next 20 days the patient and his spouse will work with SW to identify cause of Medicaid denial and initiate an appeal Goal Met . New 01/25/20 Over the next 60 days the patient and his wife will work with SW to address ongoing care coordination needs related to caregiver assistance  CCM SW Interventions: Completed 04/05/20 with [facility] . Successful outbound call placed to the patients spouse to assess goal progression  . Informed by Mrs. Cregg the patient was just seen by Dr. Coralyn Pear to address a bleed behind the patients right eye o Patient received an injections and prescription for eye drops o Instructed to return in 1 week for a re-check o Collaboration with RN Care Manager regarding recent update to patients health  history . Determined Mrs. Corter was contacted by Mrs. Larence Penning with the in home aide office requesting a new Medicaid application be completed o Mrs. Elem completed this application and submitted to Mrs. Larence Penning approximately 1 week ago . Discussed concern with staffing challenges which is delaying patients CAP assessment . Determined mrs. [facility] has yet to be told when she will need to return to the office setting for full-time hours but she is concerned she may not be able to continue remote work indefinitely to assist with patient care needs . Scheduled follow up call over the next month to assess goal progression o Encouraged Mrs. Zynda to contact SW and or Consulting civil engineer as needed prior to next scheduled call  Completed 03/20/20 . Successful outbound call placed to [facility] in response to voice message received . Mrs. Wiegand reports she is still awaiting assessment for CAP services . Mrs. Beeck was contacted by Mrs. Larence Penning  with the CAP office who stated they are short staffed but are hoping to be able to complete assessment in the coming weeks  Completed 02/29/20 with [facility] . Successful outbound call placed to the patients spouse to review goal progression . Determined Mrs. [facility] has been in contact with Ilda Mori (contact made on 6/22)  who has assigned a nurse to perform an assessment to determine patient eligibility for CAP/DA services o Mrs. Larence Penning informed Mrs. Bluemel to expect a call over the next 10 days to schedule assessment . Encouraged Mrs. Bermingham to contact SW with outcome of assessment and/or if further SW assistance is needed  . Scheduled follow up call over the next 6 weeks   Patient Self Care Activities:  . Attends all scheduled provider appointments . Calls pharmacy for medication refills . Calls provider office for new concerns or questions . Supportive wife and caregiver to assist with patient care needs   Please see past updates related to this goal by  clicking on the "Past Updates" button in the selected goal         Follow Up Plan: SW will follow up with patient by phone over the next month.   [name], BSW, CDP Social Worker, Certified Dementia Practitioner [place] [facility] [place] [facility] [phone]  Total time spent performing care coordination and/or care management activities with the patient by phone or face to face = 15 minutes.

## 2020-04-05 NOTE — Progress Notes (Signed)
[facility] Note  04/05/2020     CHIEF COMPLAINT Patient presents for Diabetic Eye Exam   HISTORY OF PRESENT ILLNESS: Isaiah [name] is a 54 y.o. male who presents to the clinic today for:   HPI    Diabetic Eye Exam    Vision is blurred for distance, is blurred for near and is worsening.  Associated Symptoms Negative for Flashes, Blind Spot, Photophobia, Scalp Tenderness, Fever, Floaters, Pain, Glare, Jaw Claudication, Weight Loss, Distortion, Redness, Trauma, Fatigue and Shoulder/Hip pain.  Diabetes characteristics include Type 1 and on insulin.  This started 30 years ago.  Blood sugar level is controlled.  Last Blood Glucose 125.  Last A1C 5.5 (1 month ago).  Associated Diagnosis Kidney Disease and Dialysis.  I, the attending physician,  performed the HPI with the patient and updated documentation appropriately.          Comments    Patient states vision decreased OS. Had CE with IOL OS in January 2021 with Dr. Herbert Deaner. Vision was good initially OS after cataract surgery, then patient started having problems with vision about 3 months following surgery. Saw Dr. [name] on 07.27.21 and IOP was elevated OD. Was put on brimonidine bid OD and Pred Forte every hour OD. Patient had stroke January 2017.        Last edited by [name], MD on 04/07/2020 10:27 PM. (History)    Pt doesn't feel like gtts Dr. [name] put him on have helped him in any way.  VA OD is still very blurred, and has been for quite a while.  OD is generally feeling better though.  Referring physician: [place], [place], [place],   [id number]  HISTORICAL INFORMATION:   Selected notes from the MEDICAL RECORD NUMBER Pt of Dr. [name].  Urgent re-referral from Dr. [name] for possible PDR w/NVI OD.  Presented to [facility] on 7.27.21 w/elevated IOPS and OD swelling and irritation.  Started on Brimonidine BID OD and Pred Q1hr OD.    CURRENT  MEDICATIONS: Current Outpatient Medications (Ophthalmic Drugs)  Medication Sig  . brimonidine (ALPHAGAN) 0.2 % ophthalmic solution Place 1 drop into the right eye 2 (two) times daily.  . prednisoLONE acetate (PRED FORTE) 1 % ophthalmic suspension Place 1 drop into the right eye every hour while awake.   No current facility-administered medications for this visit. (Ophthalmic Drugs)   Current Outpatient Medications (Other)  Medication Sig  . acetaminophen (TYLENOL) 325 MG tablet Take 1-2 tablets (325-650 mg total) by mouth every 4 (four) hours as needed for mild pain.  [name] albuterol (PROAIR HFA) 108 (90 Base) MCG/ACT inhaler Inhale two puffs every 4-6 hours if needed for cough or wheeze (Patient taking differently: Inhale 2 puffs into the lungs every 4 (four) hours as needed for wheezing or shortness of breath. Inhale two puffs every 4-6 hours if needed for cough or wheeze)  . Alcohol Swabs (SM ALCOHOL PREP) 70 % PADS   . atorvastatin (LIPITOR) 20 MG tablet Take 1 tablet (20 mg total) by mouth daily.  Lorin Picket 1 GM 210 MG(Fe) tablet Take 210 mg by mouth 3 (three) times daily.  [name] BREO ELLIPTA 200-25 MCG/INH AEPB INHALE 1 PUFF BY MOUTH AT THE SAME TIME EVERY DAY (Patient taking differently: Inhale 1 puff into the lungs daily. )  . camphor-menthol (SARNA) lotion Apply 1 application topically every 8 (eight) hours as needed for itching.  . clonazePAM (KLONOPIN) 0.5 MG tablet Take  2 tablets every Monday, Wednesday, and Friday with hemodialysis (Patient taking differently: Take 1 mg by mouth every Monday, Wednesday, and Friday. Before dialysis)  . FREESTYLE LITE test strip   . hydrOXYzine (ATARAX/VISTARIL) 25 MG tablet Take 1 tablet (25 mg total) by mouth every 8 (eight) hours as needed for itching.  . Insulin Glargine (BASAGLAR KWIKPEN) 100 UNIT/ML Inject 10 Units into the skin daily.   . Insulin Lispro (HUMALOG KWIKPEN [place]) Inject 2-4 Units into the skin 3 (three) times daily before meals. Sliding  scale: 150-250 =2 units, 250 -350=3 units, 350 += 4 units  . lacosamide (VIMPAT) 200 MG TABS tablet Take 1 tablet twice a day except on dialysis days M-W-F give 1 tab in AM, 1 tab after dialysis, 1 tab in PM. (Patient taking differently: Take 200 mg by mouth See admin instructions. Take 1 tablet twice a day except on dialysis days M-W-F give 1 tab in AM, 1 tab after dialysis, 1 tab in PM.)  . lamoTRIgine (LAMICTAL) 200 MG tablet Take 1 tablet (200 mg total) by mouth 2 (two) times daily.  . Lancets (FREESTYLE) lancets   . metoprolol tartrate (LOPRESSOR) 25 MG tablet Take 25 mg by mouth daily.  . midodrine (PROAMATINE) 10 MG tablet Take 1 tablet (10 mg total) by mouth 2 (two) times daily with a meal.  . multivitamin (RENA-VIT) TABS tablet Take 1 tablet by mouth at bedtime.  . Nutritional Supplements (FEEDING SUPPLEMENT, NEPRO CARB STEADY,) LIQD Take 237 mLs by mouth 3 (three) times daily as needed (Supplement).  . pantoprazole (PROTONIX) 40 MG tablet TAKE 1 TABLET BY MOUTH ONCE DAILY (Patient taking differently: Take 40 mg by mouth daily. )  . prochlorperazine (COMPAZINE) 10 MG tablet TAKE 1 TABLET (10 MG TOTAL) BY MOUTH EVERY 6 (SIX) HOURS AS NEEDED FOR NAUSEA OR VOMITING.  [name] UNIFINE PENTIPS 31G X 8 MM MISC USE AS DIRECTED DAILY AFTER SUPPER  . acyclovir (ZOVIRAX) 400 MG tablet TAKE 1 TABLET BY MOUTH ONCE DAILY (Patient not taking: Reported on 04/05/2020)  . Cetirizine HCl 10 MG CAPS Take 1 capsule by mouth daily. (Patient not taking: Reported on 04/05/2020)  . ciprofloxacin (CIPRO) 250 MG tablet Take 1 tablet (250 mg total) by mouth daily with breakfast. (Patient not taking: Reported on 03/29/2020)  . dexamethasone (DECADRON) 4 MG tablet Take 5 tablets (20 mg total) by mouth once a week. Take 5 tablets (20 mg total) by mouth once a week on day of chemo. (Patient not taking: Reported on 03/29/2020)  . metroNIDAZOLE (FLAGYL) 250 MG tablet Take 1 tablet (250 mg total) by mouth every 8 (eight) hours. (Patient  not taking: Reported on 03/29/2020)   No current facility-administered medications for this visit. (Other)      REVIEW OF SYSTEMS: ROS    Positive for: Neurological, Genitourinary, Endocrine, Eyes   Negative for: Constitutional, Gastrointestinal, Skin, Musculoskeletal, HENT, Cardiovascular, Respiratory, Psychiatric, Allergic/Imm, Heme/Lymph   Last edited by Roselee Nova D, COT on 04/05/2020  8:11 AM. (History)       ALLERGIES Allergies  Allergen Reactions  . Codeine Swelling  . Codeine Rash and Other (See Comments)    Unknown reaction (patient says it was more serious than just a rash, but he can't remember what happened)     PAST MEDICAL HISTORY Past Medical History:  Diagnosis Date  . A-fib ([place])   . Anemia   . Asthma   . DM type 2 (diabetes mellitus, type 2) ([place]) 06/09/2019  . ESRD (end stage  renal disease) on dialysis ([place])   . Essential hypertension 06/09/2019  . GIB (gastrointestinal bleeding)    Recurrent episodes- 09/2014, 09/2015 and 05/2016  . Gout   . History of recent blood transfusion 10/27/14   2 Units PRBC's  . Hyperkalemia 07/2011  . Multiple myeloma ([place])   . OSA on CPAP   . Pulmonary embolism ([place]) 07/2011; 09/27/2014   a. Bilat PE 07/2011 - unclear cause, tx with 6 months Coumadin.;   . Seizure disorder ([place]) 06/09/2019  . Sepsis ([place])   . Sickle cell-thalassemia disease ([place])    a. Sickle cell trait.  . Sleep apnea   . Stroke ([place]) 09/2015   R-MCA, L-MCA, PCA and bilateral cerebellar complicated by DVT/PE  . Subdural hematoma ([place]) 05/2019   Past Surgical History:  Procedure Laterality Date  . AV FISTULA PLACEMENT Right 12/05/2015   Procedure: INSERTION OF ARTERIOVENOUS (AV) GORE-TEX GRAFT ARM;  Surgeon: Elam Dutch, MD;  Location: [facility];  Service: Vascular;  Laterality: Right;  . BONE MARROW BIOPSY    . CATARACT EXTRACTION  08/2019  . CATARACT EXTRACTION  09/2019  . CHOLECYSTECTOMY    . CHOLECYSTECTOMY  1990's?  . COLONOSCOPY WITH  PROPOFOL N/A 01/22/2017   Procedure: COLONOSCOPY WITH PROPOFOL;  Surgeon: Wilford Corner, MD;  Location: [facility];  Service: Endoscopy;  Laterality: N/A;  . EYE SURGERY Right   . INSERTION OF DIALYSIS CATHETER Right 10/16/2015   Procedure: INSERTION OF PALINDROME DIALYSIS CATHETER ;  Surgeon: Elam Dutch, MD;  Location: [facility];  Service: Vascular;  Laterality: Right;  . OTHER SURGICAL HISTORY     Retinal surgery  . PARS PLANA VITRECTOMY  02/17/2012   Procedure: PARS PLANA VITRECTOMY WITH 25 GAUGE;  Surgeon: Hayden Pedro, MD;  Location: [facility];  Service: Ophthalmology;  Laterality: Right;  . PERIPHERAL VASCULAR CATHETERIZATION N/A 03/20/2016   Procedure: A/V Shuntogram/Fistulagram;  Surgeon: Conrad [place], MD;  Location: [facility];  Service: Cardiovascular;  Laterality: N/A;  . TEE WITHOUT CARDIOVERSION N/A 12/02/2016   Procedure: TRANSESOPHAGEAL ECHOCARDIOGRAM (TEE);  Surgeon: Larey Dresser, MD;  Location: [name];  Service: Cardiovascular;  Laterality: N/A;    FAMILY HISTORY Family History  Problem Relation Age of Onset  . Hypertension Mother   . Hypertension Father   . Stroke Father   . Sickle cell anemia Brother   . Diabetes Mother   . Sickle cell anemia Brother   . Diabetes type I Brother   . Kidney disease Brother     SOCIAL HISTORY Social History   Tobacco Use  . Smoking status: Never Smoker  . Smokeless tobacco: Never Used  Vaping Use  . Vaping Use: Never used  Substance Use Topics  . Alcohol use: Not Currently  . Drug use: Never         OPHTHALMIC EXAM:  Base Eye Exam    Visual Acuity (Snellen - Linear)      Right Left   Dist cc 20/300 20/200   Dist ph cc 20/200 -2 20/100 -3       Tonometry (Tonopen, 8:33 AM)      Right Left   Pressure 23 12       Pupils      Dark Light Shape React APD   Right 4 4 Round Minimal None   Left 3 3 Round Minimal None       Visual Fields (Counting fingers)      Left Right   Restrictions Partial  outer inferior temporal deficiency Partial outer inferior nasal deficiency       Extraocular Movement      Right Left    Full, Ortho Full, Ortho       Neuro/Psych    Oriented x3: Yes   Mood/Affect: Normal       Dilation    Both eyes: 1.0% Mydriacyl, 2.5% Phenylephrine @ 8:33 AM        Slit Lamp and Fundus Exam    Slit Lamp Exam      Right Left   Lids/Lashes Dermato Dermato   Conjunctiva/Sclera Melanosis Melanosis   Cornea Arcus well healed cataract wound, 3+ PEE, dry tear film, tear film debris 3+ PEE, mild cataract wounds   Anterior Chamber 3-4+ cell & pigment.  Microhyphema; no hypopyon Deep.  0.5+ pigment   Iris Round and poorly dilated to 4.71m.  Prominent vessels/early NVI greatest superiorly and temporally. Round and dilated   Lens PCIOL.  Mild RBC settled on inferior capsule PCIOL   Vitreous Post vitrectomy.  Residual anterior vitreous syneresis.  +RBC Mild asteroid hyalosis, syneresis       Fundus Exam      Right Left   Disc Hazy view.  Perfused.  Mild pallor.  Sharp rim. Hazy view.  Perfused.   C/D Ratio 0.6 0.5   Macula Hazy view.  Blunted foveal reflex. Hazy view.  Grossly flat.   Vessels Attenuated, tortuous Attenuated, tortuous   Periphery Attached.  Peripheral laser scars.  Hazy view. Attached.  360 PRP        Refraction    Wearing Rx      Sphere Cylinder Axis Add   Right -1.75 +0.50 090 +2.50   Left -1.50 Sphere  +2.50       Manifest Refraction      Sphere Cylinder Axis Dist VA   Right -2.75 +0.50 088 20/100-3   Left -1.00 sph  20/100-3          IMAGING AND PROCEDURES  Imaging and Procedures for 04/05/2020  OCT, Retina - OU - Both Eyes       Right Eye Quality was borderline. Central Foveal Thickness: 297. Progression has no prior data. Findings include normal foveal contour, no IRF, no SRF, outer retinal atrophy (Interval development of decreased ellipsoid signal centrally; diffuse retinal atrophy).   Left Eye Quality was good. Central  Foveal Thickness: 250. Progression has no prior data. Findings include normal foveal contour, no SRF (Diffuse retinal atrophy/thinning; retinoschisis temporal midzone caught on widefield).   Notes *Images captured and stored on drive  Diagnosis / Impression:  OD: Interval development of decreased ellipsoid signal centrally; diffuse retinal atrophy OS: Diffuse retinal atrophy/thinning; retinoschisis temporal midzone caught on widefield  Clinical management:  See below  Abbreviations: NFP - Normal foveal profile. CME - cystoid macular edema. PED - pigment epithelial detachment. IRF - intraretinal fluid. SRF - subretinal fluid. EZ - ellipsoid zone. ERM - epiretinal membrane. ORA - outer retinal atrophy. ORT - outer retinal tubulation. SRHM - subretinal hyper-reflective material. IRHM - intraretinal hyper-reflective material       Intravitreal Injection, Pharmacologic Agent - OD - Right Eye       Time Out 04/05/2020. 9:35 AM. Confirmed correct patient, procedure, site, and patient consented.   Anesthesia Topical anesthesia was used. Anesthetic medications included Lidocaine 2%, Proparacaine 0.5%.   Procedure Preparation included 5% betadine to ocular surface, eyelid speculum. A supplied needle was used.   Injection:  1.25 mg Bevacizumab (AVASTIN) SOLN  NDC: 65784-696-29, Lot: [id number] , Expiration date: 05/02/2020   Route: Intravitreal, Site: Right Eye, Waste: 0 mL  Post-op Post injection exam found visual acuity of at least counting fingers, no retinal detachment. The patient tolerated the procedure well. There were no complications. The patient received written and verbal post procedure care education. Post injection medications were not given.                 ASSESSMENT/PLAN:    ICD-10-CM   1. Vitreous hemorrhage, right eye (HCC)  H43.11 Intravitreal Injection, Pharmacologic Agent - OD - Right Eye    Bevacizumab (AVASTIN) SOLN 1.25 mg  2. Hyphema of right eye  H21.01    3. Proliferative diabetic retinopathy of both eyes without macular edema associated with type 2 diabetes mellitus (HCC)  B28.4132 Intravitreal Injection, Pharmacologic Agent - OD - Right Eye    Bevacizumab (AVASTIN) SOLN 1.25 mg  4. Retinal edema  H35.81 OCT, Retina - OU - Both Eyes  5. Sickle cell retinopathy without crisis ([place])  D57.1    H36   6. Essential hypertension  I10   7. Hypertensive retinopathy of both eyes  H35.033   8. Pseudophakia of both eyes  Z96.1   9. Bilateral dry eyes  H04.123    1,2. VH & microyphema OD secondary to PDR  - +NVI OD      - On Brimonidine BID OD      - On PF q1hr OD      - start Cosopt BID OD      - start cyclopentalate BID OD      - Recommend IVA OD #1 today, 7.29.21 for VH, microhyphema and NVI  - RBA of procedure discussed, questions answered  - informed consent obtained and signed  - see procedure note  - VH precautions reviewed -- minimize activities, keep head elevated, avoid ASA/NSAIDs/blood thinners as able  - F/u in 1 wk w/DFE/OCT/Optos FA (Transit OD)  3,4. Proliferative diabetic retinopathy w/ retinal ischemia, no DME, OU (OD > OS)  - pt of Dr. [name], last visit 3.4.21  - history of PPV w/ endolaser, FAX OD 6.11.13; PRP OS 2020  - The incidence, risk factors for progression, natural history and treatment options for diabetic retinopathy were discussed with patient.    - The need for close monitoring of blood glucose, blood pressure, and serum lipids, avoiding cigarette or any type of tobacco, and the need for long term follow up was also discussed with patient.  - exam shows NVI and VH OD; OS with good 360 PRP and +asteroid hyalosis  - OCT without diabetic macular edema, but diffuse atrophy, both eyes   - discussed findings and prognosis  - recommend IVA OD today (7.29.21) for VH, microhyphema and NVI as above  5. History of Sickle Cell Retinopathy  - 360 PRP in place OU  6,7. Hypertensive retinopathy OU - discussed importance  of tight BP control - monitor  8. Pseudophakia OU  - s/p CE/IOL OU (OD Jan 2021; OS Feb 2021  - IOL in good position, doing well  - monitor  9. Dry eyes OU - recommend artificial tears and lubricating ointment as needed  Ophthalmic Meds Ordered this visit:  Meds ordered this encounter  Medications  . Bevacizumab (AVASTIN) SOLN 1.25 mg       Return in about 1 week (around 04/12/2020) for 1 wk f/u for [name] OD w/DFE/OCT/Optos FA (Transit OD).  There are no Patient Instructions on file for this visit.  Explained the diagnoses, plan, and follow up with the patient and they expressed understanding.  Patient expressed understanding of the importance of proper follow up care.  This document serves as a record of services personally performed by Gardiner Sleeper, MD, PhD. It was created on their behalf by Estill Bakes, COT an ophthalmic technician. The creation of this record is the provider's dictation and/or activities during the visit.    Electronically signed by: Estill Bakes, COT 7.29.21 @ 1:36 AM   Gardiner Sleeper, M.D., Ph.D. Diseases & Surgery of the Retina and [place] 7.29.21  I have reviewed the above documentation for accuracy and completeness, and I agree with the above. Gardiner Sleeper, M.D., Ph.D. 04/08/20 1:36 AM    Abbreviations: M myopia (nearsighted); A astigmatism; H hyperopia (farsighted); P presbyopia; Mrx spectacle prescription;  CTL contact lenses; OD right eye; OS left eye; OU both eyes  XT exotropia; ET esotropia; PEK punctate epithelial keratitis; PEE punctate epithelial erosions; DES dry eye syndrome; MGD meibomian gland dysfunction; ATs artificial tears; PFAT's preservative free artificial tears; [place] nuclear sclerotic cataract; PSC posterior subcapsular cataract; ERM epi-retinal membrane; PVD posterior vitreous detachment; RD retinal detachment; DM diabetes mellitus; DR diabetic retinopathy; NPDR non-proliferative diabetic  retinopathy; PDR proliferative diabetic retinopathy; CSME clinically significant macular edema; DME diabetic macular edema; dbh dot blot hemorrhages; CWS cotton wool spot; POAG primary open angle glaucoma; C/D cup-to-disc ratio; HVF humphrey visual field; GVF goldmann visual field; OCT optical coherence tomography; IOP intraocular pressure; BRVO Branch retinal vein occlusion; CRVO central retinal vein occlusion; CRAO central retinal artery occlusion; BRAO branch retinal artery occlusion; RT retinal tear; SB scleral buckle; PPV pars plana vitrectomy; VH Vitreous hemorrhage; PRP panretinal laser photocoagulation; IVK intravitreal kenalog; VMT vitreomacular traction; MH Macular hole;  NVD neovascularization of the disc; NVE neovascularization elsewhere; AREDS age related eye disease study; ARMD age related macular degeneration; POAG primary open angle glaucoma; EBMD epithelial/anterior basement membrane dystrophy; ACIOL anterior chamber intraocular lens; IOL intraocular lens; PCIOL posterior chamber intraocular lens; Phaco/IOL phacoemulsification with intraocular lens placement; [place] photorefractive keratectomy; LASIK laser assisted in situ keratomileusis; HTN hypertension; DM diabetes mellitus; COPD chronic obstructive pulmonary disease

## 2020-04-05 NOTE — Patient Instructions (Signed)
Social Worker Visit Information  Goals we discussed today:  Goals Addressed            This Visit's Progress   . Collaborate with RN Care Manager to perform apprpriate assessments to determine care management and care coordination needs       Current Barriers:  . Care management and care coordination needs related to DMII, ESRD, and multiple myeloma  which resulted in inpatient hospitalization . ADL IADL limitations  Clinical Social Work Clinical Goal(s):  [name] Over the next 60 days, patient will collaborate with care management team to assist with care coordination needs in response to recent inpatient hospitalization . New 01/16/20- Over the next 20 days the patient and his spouse will work with SW to identify cause of Medicaid denial and initiate an appeal Goal Met . New 01/25/20 Over the next 60 days the patient and his wife will work with SW to address ongoing care coordination needs related to caregiver assistance  CCM SW Interventions: Completed 04/05/20 with [facility] . Successful outbound call placed to the patients spouse to assess goal progression  . Informed by Mrs. Talmadge the patient was just seen by Dr. Coralyn Pear to address a bleed behind the patients right eye o Patient received an injections and prescription for eye drops o Instructed to return in 1 week for a re-check o Collaboration with RN Care Manager regarding recent update to patients health history . Determined Mrs. Crislip was contacted by Mrs. Larence Penning with the in home aide office requesting a new Medicaid application be completed o Mrs. Luttrull completed this application and submitted to Mrs. Larence Penning approximately 1 week ago . Discussed concern with staffing challenges which is delaying patients CAP assessment . Determined m[facility]. [facility] has yet to be told when she will need to return to the office setting for full-time hours but she is concerned she may not be able to continue remote work indefinitely to assist with patient  care needs . Scheduled follow up call over the next month to assess goal progression o Encouraged Mrs. Breed to contact SW and or Consulting civil engineer as needed prior to next scheduled call  Completed 03/20/20 . Successful outbound call placed to [facility] in response to voice message received . Mrs. Morton reports she is still awaiting assessment for CAP services . Mrs. Waterson was contacted by Mrs. Larence Penning with the CAP office who stated they are short staffed but are hoping to be able to complete assessment in the coming weeks  Completed 02/29/20 with [facility] . Successful outbound call placed to the patients spouse to review goal progression . Determined Mrs. [facility] has been in contact with Ilda Mori (contact made on 6/22)  who has assigned a nurse to perform an assessment to determine patient eligibility for CAP/DA services o Mrs. Larence Penning informed Mrs. Hargens to expect a call over the next 10 days to schedule assessment . Encouraged Mrs. Boisselle to contact SW with outcome of assessment and/or if further SW assistance is needed  . Scheduled follow up call over the next 6 weeks   Patient Self Care Activities:  . Attends all scheduled provider appointments . Calls pharmacy for medication refills . Calls provider office for new concerns or questions . Supportive wife and caregiver to assist with patient care needs   Please see past updates related to this goal by clicking on the "Past Updates" button in the selected goal         Follow Up Plan: SW will  follow up with patient by phone over the next month.   [name], BSW, CDP Social Worker, Certified Dementia Practitioner [place] [facility] [place] [facility] [phone]

## 2020-04-06 DIAGNOSIS — N186 End stage renal disease: Secondary | ICD-10-CM | POA: Diagnosis not present

## 2020-04-06 DIAGNOSIS — D509 Iron deficiency anemia, unspecified: Secondary | ICD-10-CM | POA: Diagnosis not present

## 2020-04-06 DIAGNOSIS — D631 Anemia in chronic kidney disease: Secondary | ICD-10-CM | POA: Diagnosis not present

## 2020-04-06 DIAGNOSIS — N2581 Secondary hyperparathyroidism of renal origin: Secondary | ICD-10-CM | POA: Diagnosis not present

## 2020-04-06 DIAGNOSIS — E1129 Type 2 diabetes mellitus with other diabetic kidney complication: Secondary | ICD-10-CM | POA: Diagnosis not present

## 2020-04-06 DIAGNOSIS — Z992 Dependence on renal dialysis: Secondary | ICD-10-CM | POA: Diagnosis not present

## 2020-04-08 DIAGNOSIS — N186 End stage renal disease: Secondary | ICD-10-CM | POA: Diagnosis not present

## 2020-04-08 DIAGNOSIS — Z992 Dependence on renal dialysis: Secondary | ICD-10-CM | POA: Diagnosis not present

## 2020-04-08 DIAGNOSIS — N2889 Other specified disorders of kidney and ureter: Secondary | ICD-10-CM | POA: Diagnosis not present

## 2020-04-08 MED ORDER — BEVACIZUMAB CHEMO INJECTION 1.25MG/0.05ML SYRINGE FOR KALEIDOSCOPE
1.2500 mg | INTRAVITREAL | Status: AC | PRN
  Administered 2020-04-08: 1.25 mg via INTRAVITREAL

## 2020-04-09 DIAGNOSIS — D509 Iron deficiency anemia, unspecified: Secondary | ICD-10-CM | POA: Diagnosis not present

## 2020-04-09 DIAGNOSIS — N2581 Secondary hyperparathyroidism of renal origin: Secondary | ICD-10-CM | POA: Diagnosis not present

## 2020-04-09 DIAGNOSIS — D631 Anemia in chronic kidney disease: Secondary | ICD-10-CM | POA: Diagnosis not present

## 2020-04-09 DIAGNOSIS — N186 End stage renal disease: Secondary | ICD-10-CM | POA: Diagnosis not present

## 2020-04-09 DIAGNOSIS — Z992 Dependence on renal dialysis: Secondary | ICD-10-CM | POA: Diagnosis not present

## 2020-04-09 DIAGNOSIS — E1129 Type 2 diabetes mellitus with other diabetic kidney complication: Secondary | ICD-10-CM | POA: Diagnosis not present

## 2020-04-10 ENCOUNTER — Encounter: Payer: Self-pay | Admitting: Physical Therapy

## 2020-04-10 ENCOUNTER — Encounter: Payer: Self-pay | Admitting: Internal Medicine

## 2020-04-10 ENCOUNTER — Ambulatory Visit: Payer: Medicare Other | Attending: Physical Medicine & Rehabilitation | Admitting: Physical Therapy

## 2020-04-10 ENCOUNTER — Other Ambulatory Visit: Payer: Self-pay

## 2020-04-10 ENCOUNTER — Other Ambulatory Visit: Payer: Self-pay | Admitting: Internal Medicine

## 2020-04-10 DIAGNOSIS — R2681 Unsteadiness on feet: Secondary | ICD-10-CM

## 2020-04-10 DIAGNOSIS — R2689 Other abnormalities of gait and mobility: Secondary | ICD-10-CM | POA: Insufficient documentation

## 2020-04-10 DIAGNOSIS — M6281 Muscle weakness (generalized): Secondary | ICD-10-CM | POA: Diagnosis not present

## 2020-04-10 MED FILL — VIMPAT 200 MG TABLET: 200 | 37 days supply | Qty: 90 | Fill #2

## 2020-04-10 MED FILL — SUBVENITE 200 MG TABS: 200 | 30 days supply | Qty: 60 | Fill #6

## 2020-04-10 NOTE — Patient Instructions (Addendum)
Access Code: [email or web address] URL: [email or web address] Date: 04/10/2020 Prepared by: Willow Ora  Exercises Seated Hip Abduction with Resistance - 1 x daily - 5 x weekly - 1 sets - 10 reps Seated Knee Extension with Resistance - 1 x daily - 5 x weekly - 1 sets - 10 reps Heel Toe Raises with Counter Support - 1 x daily - 5 x weekly - 1 sets - 10 reps Side Stepping with Counter Support - 1 x daily - 5 x weekly - 1 sets - 3 reps Standing Single Leg Stance with Counter Support - 1 x daily - 5 x weekly - 1 sets - 3 reps - 15 hold Standing Tandem Balance with Counter Support - 1 x daily - 5 x weekly - 1 sets - 3 reps - 30 hold

## 2020-04-11 DIAGNOSIS — Z992 Dependence on renal dialysis: Secondary | ICD-10-CM | POA: Diagnosis not present

## 2020-04-11 DIAGNOSIS — N2581 Secondary hyperparathyroidism of renal origin: Secondary | ICD-10-CM | POA: Diagnosis not present

## 2020-04-11 DIAGNOSIS — E1129 Type 2 diabetes mellitus with other diabetic kidney complication: Secondary | ICD-10-CM | POA: Diagnosis not present

## 2020-04-11 DIAGNOSIS — D509 Iron deficiency anemia, unspecified: Secondary | ICD-10-CM | POA: Diagnosis not present

## 2020-04-11 DIAGNOSIS — N186 End stage renal disease: Secondary | ICD-10-CM | POA: Diagnosis not present

## 2020-04-11 DIAGNOSIS — D631 Anemia in chronic kidney disease: Secondary | ICD-10-CM | POA: Diagnosis not present

## 2020-04-11 NOTE — Therapy (Signed)
[facility] [address] [place], [place], [address] Phone: [phone]   Fax:  [phone]  Physical Therapy Treatment  Patient Details  Name: Isaiah [name] [facility] MRN: [id number] Date of Birth: 04-22-1966 Referring Provider (PT): Delice Lesch, MD   Encounter Date: 04/10/2020   PT End of Session - 04/10/20 1454    Visit Number 2    Number of Visits 17    Date for PT Re-Evaluation 44/96/75   90 day cert for 8-wk POC   Authorization Type Medicare    Progress Note Due on Visit 10    PT Start Time 1449    PT Stop Time 1530    PT Time Calculation (min) 41 min    Equipment Utilized During Treatment Gait belt    Activity Tolerance Patient tolerated treatment well    Behavior During Therapy [facility] for tasks assessed/performed           Past Medical History:  Diagnosis Date  . A-fib ([place])   . Anemia   . Asthma   . DM type 2 (diabetes mellitus, type 2) ([place]) 06/09/2019  . ESRD (end stage renal disease) on dialysis ([place])   . Essential hypertension 06/09/2019  . GIB (gastrointestinal bleeding)    Recurrent episodes- 09/2014, 09/2015 and 05/2016  . Gout   . History of recent blood transfusion 10/27/14   2 Units PRBC's  . Hyperkalemia 07/2011  . Multiple myeloma ([place])   . OSA on CPAP   . Pulmonary embolism ([place]) 07/2011; 09/27/2014   a. Bilat PE 07/2011 - unclear cause, tx with 6 months Coumadin.;   . Seizure disorder ([place]) 06/09/2019  . Sepsis ([place])   . Sickle cell-thalassemia disease ([place])    a. Sickle cell trait.  . Sleep apnea   . Stroke ([place]) 09/2015   R-MCA, L-MCA, PCA and bilateral cerebellar complicated by DVT/PE  . Subdural hematoma ([place]) 05/2019    Past Surgical History:  Procedure Laterality Date  . AV FISTULA PLACEMENT Right 12/05/2015   Procedure: INSERTION OF ARTERIOVENOUS (AV) GORE-TEX GRAFT ARM;  Surgeon: Elam Dutch, MD;  Location: [place];  Service: Vascular;  Laterality: Right;  . BONE MARROW BIOPSY      . CATARACT EXTRACTION  08/2019  . CATARACT EXTRACTION  09/2019  . CHOLECYSTECTOMY    . CHOLECYSTECTOMY  1990's?  . COLONOSCOPY WITH PROPOFOL N/A 01/22/2017   Procedure: COLONOSCOPY WITH PROPOFOL;  Surgeon: Wilford Corner, MD;  [facility]: [facility];  Service: Endoscopy;  Laterality: N/A;  . EYE SURGERY Right   . INSERTION OF DIALYSIS CATHETER Right 10/16/2015   Procedure: INSERTION OF PALINDROME DIALYSIS CATHETER ;  Surgeon: Elam Dutch, MD;  Location: [facility];  Service: Vascular;  Laterality: Right;  . OTHER SURGICAL HISTORY     Retinal surgery  . PARS PLANA VITRECTOMY  02/17/2012   Procedure: PARS PLANA VITRECTOMY WITH 25 GAUGE;  Surgeon: Hayden Pedro, MD;  Location: [facility];  Service: Ophthalmology;  Laterality: Right;  . PERIPHERAL VASCULAR CATHETERIZATION N/A 03/20/2016   Procedure: A/V Shuntogram/Fistulagram;  Surgeon: Conrad Port Angeles, MD;  Location: [facility];  Service: Cardiovascular;  Laterality: N/A;  . TEE WITHOUT CARDIOVERSION N/A 12/02/2016   Procedure: TRANSESOPHAGEAL ECHOCARDIOGRAM (TEE);  Surgeon: Larey Dresser, MD;  Location: [facility];  Service: Cardiovascular;  Laterality: N/A;    There were no vitals filed for this visit.   Subjective Assessment - 04/10/20 1453    Subjective No new complaints. No falls or  pain to report. Had a proceedure at eye doctor, had a needle put into his eye, does hurt at times when drops wear off. Not hurting at this time.    Pertinent History multiple myeloma, ESRD on dialysis, DM type II, DVT with IVC filter, CVA, COPD, a-fib; chemo treatments down to 1x/month (next is August 17th)    Patient Stated Goals To get back to walk without the walker, back to his baseline    Currently in Pain? No/denies    Pain Score 0-No pain                [facility] Adult PT Treatment/Exercise - 04/10/20 1458      Transfers   Transfers Sit to Stand;Stand to Sit    Sit to Stand 4: Min guard;With upper extremity assist;From chair/3-in-1     Stand to Sit 4: Min guard;With upper extremity assist;To chair/3-in-1      Ambulation/Gait   Ambulation/Gait Yes    Ambulation/Gait Assistance 4: Min guard    Ambulation Distance (Feet) --   around gym with session   Assistive device Rolling walker    Gait Pattern Step-through pattern;Ataxic;Trunk flexed;Poor foot clearance - left;Poor foot clearance - right    Ambulation Surface Level;Indoor      Exercises   Exercises Other Exercises    Other Exercises  issued HEP to address strengthening and balance. Refer to [place] for full details. cues needed on correct form and technique.           Issued the following to HEP today. Access Code: [email or web address] URL: [email or web address] Date: 04/10/2020 Prepared by: Willow Ora  Exercises Seated Hip Abduction with Resistance - 1 x daily - 5 x weekly - 1 sets - 10 reps Seated Knee Extension with Resistance - 1 x daily - 5 x weekly - 1 sets - 10 reps Heel Toe Raises with Counter Support - 1 x daily - 5 x weekly - 1 sets - 10 reps Side Stepping with Counter Support - 1 x daily - 5 x weekly - 1 sets - 3 reps Standing Single Leg Stance with Counter Support - 1 x daily - 5 x weekly - 1 sets - 3 reps - 15 hold Standing Tandem Balance with Counter Support - 1 x daily - 5 x weekly - 1 sets - 3 reps - 30 hold        PT Education - 04/11/20 1633    Education Details HEP for strengthening and balance    Person(s) Educated Patient    Methods Explanation;Demonstration;Handout    Comprehension Verbalized understanding;Returned demonstration;Verbal cues required;Need further instruction            PT Short Term Goals - 03/29/20 1344      PT SHORT TERM GOAL #1   Title Pt will perform HEP independenlty for improved strength, balance and gait.  TARGET 04/27/2020    Time 4    Period Weeks    Status New      PT SHORT TERM GOAL #2   Title Pt will improve Berg Score to at least 23/56 for decreased fall risk.    Baseline 13/56 03/29/2020     Time 4    Period Weeks    Status New      PT SHORT TERM GOAL #3   Title Pt will improve TUG score to less than or equal to 40 seconds for decreased fall risk.    Baseline 52.25 sec 03/29/2020    Time 4    Period  Weeks    Status New      PT SHORT TERM GOAL #4   Title Pt will perform at least 3 minutes of standing with UE support and supervision for imrpoved standing tolerance for participation in ADLs.    Baseline 54 sec, 36 sec min guard/close supervision    Time 4    Period Weeks    Status New      PT SHORT TERM GOAL #5   Title Pt will improve gait velocity to at least 1.3 ft/sec for improved gait efficiency and safety.    Time 4    Period Weeks    Status New      Additional Short Term Goals   Additional Short Term Goals Yes      PT SHORT TERM GOAL #6   Title Pt/wife will verbalize understanding of fall prevention in home environment.    Time 4    Period Weeks    Status New             PT Long Term Goals - 03/29/20 1348      PT LONG TERM GOAL #1   Title Pt will be independent with progression of HEP for improved strength, blaance, gait.  TARGET 05/25/2020    Time 8    Period Weeks    Status New      PT LONG TERM GOAL #2   Title Pt will imrpove Berg Balance score to at least 33/56 for decreased fall risk.    Time 8    Period Weeks    Status New      PT LONG TERM GOAL #3   Title Pt will improve TUG score to less than or equal to 30 seconds for decreased fall risk.    Time 8    Period Weeks    Status New      PT LONG TERM GOAL #4   Title Pt will improve gait velocity to at least 1.8 ft/sec for improved gait velocity and decreased fall risk.    Time 8    Period Weeks    Status New      PT LONG TERM GOAL #5   Title Pt will ascend/descent 12 steps with both handrails with supervision only, to negotiate steps at home.    Time 8    Period Weeks    Status New                 Plan - 04/10/20 1455    Clinical Impression Statement Today's skilled  session focused on establishment of an HEP to address strengthening and balance. No issues noted or reported with performance in session today. The pt is progressing toward goals and should benefit from continued PT to progress toward unmet goals.    Personal Factors and Comorbidities Comorbidity 3+    Comorbidities PMH:  multiple myeloma, ESRD on dialysis, DM type II, DVT with IVC filter, CVA, COPD, a-fib    Examination-Activity Limitations Locomotion Level;Transfers;Stairs;Stand    Examination-Participation Restrictions Community Activity    Stability/Clinical Decision Making Evolving/Moderate complexity    Rehab Potential Good    PT Frequency 2x / week    PT Duration 8 weeks   plus eval   PT Treatment/Interventions ADLs/Self Care Home Management;DME Instruction;Neuromuscular re-education;Balance training;Therapeutic exercise;Therapeutic activities;Functional mobility training;Stair training;Gait training;Patient/family education    PT Next Visit Plan gait training, use of aerobic machines for strengthening and activity tolerance    PT Home Exercise Plan Access Code [email or web address]    [email or web address]  and Agree with Plan of Care Patient    Family Member Consulted --           Patient will benefit from skilled therapeutic intervention in order to improve the following deficits and impairments:  Abnormal gait, Difficulty walking, Decreased safety awareness, Decreased balance, Decreased mobility, Decreased strength  Visit Diagnosis: Other abnormalities of gait and mobility  Unsteadiness on feet  Muscle weakness (generalized)     Problem List Patient Active Problem List   Diagnosis Date Noted  . Colitis 03/15/2020  . AMS (altered mental status) 10/05/2019  . Acute metabolic encephalopathy 88/32/5498  . Headache, unspecified 09/28/2019  . Abnormality of gait 09/13/2019  . Diabetes mellitus with end-stage renal disease ([place]) 08/07/2019  . Benign hypertensive kidney disease with chronic kidney  disease 08/07/2019  . Hypothyroidism, unspecified 07/15/2019  . Spastic hemiparesis ([place]) 07/12/2019  . Dysphagia, oropharyngeal phase 07/05/2019  . Allergy, unspecified, initial encounter 06/27/2019  . PAF (paroxysmal atrial fibrillation) ([place])   . Bacteremia   . Labile blood pressure   . Labile blood glucose   . Controlled type 2 diabetes mellitus with hyperglycemia, without long-term current use of insulin ([place])   . ESRD on dialysis ([place])   . Right sided weakness   . SDH (subdural hematoma) ([place])   . Chronic intracranial subdural hematoma ([facility]) 06/09/2019  . Multiple myeloma not having achieved remission ([place]) 06/09/2019  . ESRD on hemodialysis ([place]) 06/09/2019  . Essential hypertension 06/09/2019  . DM type 2 (diabetes mellitus, type 2) ([place]) 06/09/2019  . Seizure disorder ([place]) 06/09/2019  . Aspiration pneumonia ([place]) 06/09/2019  . History of bacteremia 06/09/2019  . Atrial fibrillation, chronic ([place]) 06/09/2019  . Bilateral kidney stones 05/31/2019  . Proteus infection 05/28/2019  . History of restrictive pulmonary disease 04/07/2019  . Multiple myeloma ([place]) 05/17/2018  . Goals of care, counseling/discussion 05/17/2018  . Partial idiopathic epilepsy with seizures of localized onset, intractable, without status epilepticus ([place]) 11/02/2017  . Iron deficiency anemia, unspecified 01/22/2017  . COPD (chronic obstructive pulmonary disease) ([place]) 11/11/2016  . CVA (cerebral vascular accident) ([place]) 11/11/2016  . GERD (gastroesophageal reflux disease) 11/11/2016  . GI bleed 11/11/2016  . 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  . Obesity (BMI 30-39.9) 11/11/2016  . Pulmonary hypertension ([place]) 11/11/2016  . Patent foramen ovale 11/11/2016  . Hyperparathyroidism, secondary renal ([place]) 11/11/2016  . VTE (venous thromboembolism) 11/11/2016  . Hypotension 11/11/2016  . Systemic hypertension 11/11/2016  . Seizure ([place]) 11/11/2016  . Other seizures ([place]) 07/25/2016  .  Encounter for removal of sutures 05/27/2016  . Ingrown nail 03/03/2016  . Onychomycosis due to dermatophyte 03/03/2016  . Other acquired hammer toe 03/03/2016  . Hemiparesis affecting right side as late effect of cerebrovascular accident ([place]) 02/28/2016  . Aphasia complicating stroke 26/41/5830  . Seizures ([place]) 01/08/2016  . History of ischemic left MCA stroke 01/08/2016  . Right sided weakness 01/08/2016  . Atrial fibrillation ([place]) 01/08/2016  . Leukocytosis 01/08/2016  . Benign essential HTN 01/08/2016  . Anemia of chronic disease 01/08/2016  . Controlled type 2 diabetes mellitus with diabetic nephropathy, without long-term current use of insulin ([place]) 01/08/2016  . History of DVT (deep vein thrombosis) 01/08/2016  . 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 Hypocalcemia 12/26/2015  . Long term (current) use of anticoagulants 11/07/2015  . Aftercare including intermittent dialysis ([place]) 11/02/2015  . Other specified coagulation defects ([place]) 11/02/2015  . Complication of vascular dialysis catheter 11/02/2015  . Diarrhea, unspecified 11/02/2015  . Fever, unspecified 11/02/2015  .  Pruritus, unspecified 11/02/2015  . Shortness of breath 11/02/2015  . Type 2 diabetes mellitus with diabetic peripheral angiopathy without gangrene ([place]) 11/02/2015  . ESRD (end stage renal disease) on dialysis ([place])     Willow Ora, PTA, [place] [address], [place], [place] [address] [phone] 04/11/20, 4:36 PM   Name: Isaiah [name] [name] MRN: [id number] Date of Birth: 09-01-1966

## 2020-04-12 ENCOUNTER — Ambulatory Visit (INDEPENDENT_AMBULATORY_CARE_PROVIDER_SITE_OTHER): Payer: Medicare Other

## 2020-04-12 ENCOUNTER — Ambulatory Visit (INDEPENDENT_AMBULATORY_CARE_PROVIDER_SITE_OTHER): Payer: Medicare Other | Admitting: Ophthalmology

## 2020-04-12 ENCOUNTER — Encounter: Payer: Self-pay | Admitting: Internal Medicine

## 2020-04-12 ENCOUNTER — Ambulatory Visit (INDEPENDENT_AMBULATORY_CARE_PROVIDER_SITE_OTHER): Payer: Medicare Other | Admitting: Internal Medicine

## 2020-04-12 ENCOUNTER — Other Ambulatory Visit: Payer: Self-pay

## 2020-04-12 ENCOUNTER — Encounter (INDEPENDENT_AMBULATORY_CARE_PROVIDER_SITE_OTHER): Payer: Self-pay | Admitting: Ophthalmology

## 2020-04-12 VITALS — BP 130/80 | HR 89 | Temp 97.5°F | Ht 70.0 in | Wt 189.0 lb

## 2020-04-12 VITALS — BP 130/80 | HR 89 | Temp 97.5°F | Ht 70.0 in | Wt 187.0 lb

## 2020-04-12 DIAGNOSIS — I120 Hypertensive chronic kidney disease with stage 5 chronic kidney disease or end stage renal disease: Secondary | ICD-10-CM | POA: Diagnosis not present

## 2020-04-12 DIAGNOSIS — H4311 Vitreous hemorrhage, right eye: Secondary | ICD-10-CM

## 2020-04-12 DIAGNOSIS — H3581 Retinal edema: Secondary | ICD-10-CM | POA: Diagnosis not present

## 2020-04-12 DIAGNOSIS — Z1159 Encounter for screening for other viral diseases: Secondary | ICD-10-CM

## 2020-04-12 DIAGNOSIS — H35033 Hypertensive retinopathy, bilateral: Secondary | ICD-10-CM

## 2020-04-12 DIAGNOSIS — N186 End stage renal disease: Secondary | ICD-10-CM | POA: Diagnosis not present

## 2020-04-12 DIAGNOSIS — Z0000 Encounter for general adult medical examination without abnormal findings: Secondary | ICD-10-CM | POA: Diagnosis not present

## 2020-04-12 DIAGNOSIS — Z961 Presence of intraocular lens: Secondary | ICD-10-CM | POA: Diagnosis not present

## 2020-04-12 DIAGNOSIS — E663 Overweight: Secondary | ICD-10-CM

## 2020-04-12 DIAGNOSIS — Z992 Dependence on renal dialysis: Secondary | ICD-10-CM

## 2020-04-12 DIAGNOSIS — Z6827 Body mass index (BMI) 27.0-27.9, adult: Secondary | ICD-10-CM | POA: Diagnosis not present

## 2020-04-12 DIAGNOSIS — H36 Retinal disorders in diseases classified elsewhere: Secondary | ICD-10-CM | POA: Diagnosis not present

## 2020-04-12 DIAGNOSIS — H2101 Hyphema, right eye: Secondary | ICD-10-CM

## 2020-04-12 DIAGNOSIS — E113593 Type 2 diabetes mellitus with proliferative diabetic retinopathy without macular edema, bilateral: Secondary | ICD-10-CM | POA: Diagnosis not present

## 2020-04-12 DIAGNOSIS — D571 Sickle-cell disease without crisis: Secondary | ICD-10-CM | POA: Diagnosis not present

## 2020-04-12 DIAGNOSIS — E1122 Type 2 diabetes mellitus with diabetic chronic kidney disease: Secondary | ICD-10-CM

## 2020-04-12 DIAGNOSIS — H04123 Dry eye syndrome of bilateral lacrimal glands: Secondary | ICD-10-CM

## 2020-04-12 DIAGNOSIS — I10 Essential (primary) hypertension: Secondary | ICD-10-CM

## 2020-04-12 NOTE — Patient Instructions (Signed)
Isaiah [name] , Thank you for taking time to come for your Medicare Wellness Visit. I appreciate your ongoing commitment to your health goals. Please review the following plan we discussed and let me know if I can assist you in the future.   Screening recommendations/referrals: Colonoscopy: completed 01/22/2017 Recommended yearly ophthalmology/optometry visit for glaucoma screening and checkup Recommended yearly dental visit for hygiene and checkup  Vaccinations: Influenza vaccine: due Pneumococcal vaccine: will get at dialysis Tdap vaccine: will get at dialysis Shingles vaccine: discussed   Covid-19: 12/23/2019, 11/25/2019  Advanced directives: Please bring a copy of your POA (Power of Attorney) and/or Living Will to your next appointment.   Conditions/risks identified: none  Next appointment: Follow up in one year for your annual wellness visit 04/18/2021 at 2:45  Preventive Care 40-64 Years, Male Preventive care refers to lifestyle choices and visits with your health care provider that can promote health and wellness. What does preventive care include?  A yearly physical exam. This is also called an annual well check.  Dental exams once or twice a year.  Routine eye exams. Ask your health care provider how often you should have your eyes checked.  Personal lifestyle choices, including:  Daily care of your teeth and gums.  Regular physical activity.  Eating a healthy diet.  Avoiding tobacco and drug use.  Limiting alcohol use.  Practicing safe sex.  Taking low-dose aspirin every day starting at age 53. What happens during an annual well check? The services and screenings done by your health care provider during your annual well check will depend on your age, overall health, lifestyle risk factors, and family history of disease. Counseling  Your health care provider may ask you questions about your:  Alcohol use.  Tobacco use.  Drug use.  Emotional  well-being.  Home and relationship well-being.  Sexual activity.  Eating habits.  Work and work Statistician. Screening  You may have the following tests or measurements:  Height, weight, and BMI.  Blood pressure.  Lipid and cholesterol levels. These may be checked every 5 years, or more frequently if you are over 30 years old.  Skin check.  Lung cancer screening. You may have this screening every year starting at age 72 if you have a 30-pack-year history of smoking and currently smoke or have quit within the past 15 years.  Fecal occult blood test (FOBT) of the stool. You may have this test every year starting at age 71.  Flexible sigmoidoscopy or colonoscopy. You may have a sigmoidoscopy every 5 years or a colonoscopy every 10 years starting at age 64.  Prostate cancer screening. Recommendations will vary depending on your family history and other risks.  Hepatitis C blood test.  Hepatitis B blood test.  Sexually transmitted disease (STD) testing.  Diabetes screening. This is done by checking your blood sugar (glucose) after you have not eaten for a while (fasting). You may have this done every 1-3 years. Discuss your test results, treatment options, and if necessary, the need for more tests with your health care provider. Vaccines  Your health care provider may recommend certain vaccines, such as:  Influenza vaccine. This is recommended every year.  Tetanus, diphtheria, and acellular pertussis (Tdap, Td) vaccine. You may need a Td booster every 10 years.  Zoster vaccine. You may need this after age 63.  Pneumococcal 13-valent conjugate (PCV13) vaccine. You may need this if you have certain conditions and have not been vaccinated.  Pneumococcal polysaccharide (PPSV23) vaccine. You may need  one or two doses if you smoke cigarettes or if you have certain conditions. Talk to your health care provider about which screenings and vaccines you need and how often you need  them. This information is not intended to replace advice given to you by your health care provider. Make sure you discuss any questions you have with your health care provider. Document Released: 09/21/2015 Document Revised: 05/14/2016 Document Reviewed: 06/26/2015 Elsevier Interactive Patient Education  2017 [employer] Prevention in the Home Falls can cause injuries. They can happen to people of all ages. There are many things you can do to make your home safe and to help prevent falls. What can I do on the outside of my home?  Regularly fix the edges of walkways and driveways and fix any cracks.  Remove anything that might make you trip as you walk through a door, such as a raised step or threshold.  Trim any bushes or trees on the path to your home.  Use bright outdoor lighting.  Clear any walking paths of anything that might make someone trip, such as rocks or tools.  Regularly check to see if handrails are loose or broken. Make sure that both sides of any steps have handrails.  Any raised decks and porches should have guardrails on the edges.  Have any leaves, snow, or ice cleared regularly.  Use sand or salt on walking paths during winter.  Clean up any spills in your garage right away. This includes oil or grease spills. What can I do in the bathroom?  Use night lights.  Install grab bars by the toilet and in the tub and shower. Do not use towel bars as grab bars.  Use non-skid mats or decals in the tub or shower.  If you need to sit down in the shower, use a plastic, non-slip stool.  Keep the floor dry. Clean up any water that spills on the floor as soon as it happens.  Remove soap buildup in the tub or shower regularly.  Attach bath mats securely with double-sided non-slip rug tape.  Do not have throw rugs and other things on the floor that can make you trip. What can I do in the bedroom?  Use night lights.  Make sure that you have a light by your  bed that is easy to reach.  Do not use any sheets or blankets that are too big for your bed. They should not hang down onto the floor.  Have a firm chair that has side arms. You can use this for support while you get dressed.  Do not have throw rugs and other things on the floor that can make you trip. What can I do in the kitchen?  Clean up any spills right away.  Avoid walking on wet floors.  Keep items that you use a lot in easy-to-reach places.  If you need to reach something above you, use a strong step stool that has a grab bar.  Keep electrical cords out of the way.  Do not use floor polish or wax that makes floors slippery. If you must use wax, use non-skid floor wax.  Do not have throw rugs and other things on the floor that can make you trip. What can I do with my stairs?  Do not leave any items on the stairs.  Make sure that there are handrails on both sides of the stairs and use them. Fix handrails that are broken or loose. Make sure that  handrails are as long as the stairways.  Check any carpeting to make sure that it is firmly attached to the stairs. Fix any carpet that is loose or worn.  Avoid having throw rugs at the top or bottom of the stairs. If you do have throw rugs, attach them to the floor with carpet tape.  Make sure that you have a light switch at the top of the stairs and the bottom of the stairs. If you do not have them, ask someone to add them for you. What else can I do to help prevent falls?  Wear shoes that:  Do not have high heels.  Have rubber bottoms.  Are comfortable and fit you well.  Are closed at the toe. Do not wear sandals.  If you use a stepladder:  Make sure that it is fully opened. Do not climb a closed stepladder.  Make sure that both sides of the stepladder are locked into place.  Ask someone to hold it for you, if possible.  Clearly mark and make sure that you can see:  Any grab bars or handrails.  First and last  steps.  Where the edge of each step is.  Use tools that help you move around (mobility aids) if they are needed. These include:  Canes.  Walkers.  Scooters.  Crutches.  Turn on the lights when you go into a dark area. Replace any light bulbs as soon as they burn out.  Set up your furniture so you have a clear path. Avoid moving your furniture around.  If any of your floors are uneven, fix them.  If there are any pets around you, be aware of where they are.  Review your medicines with your doctor. Some medicines can make you feel dizzy. This can increase your chance of falling. Ask your doctor what other things that you can do to help prevent falls. This information is not intended to replace advice given to you by your health care provider. Make sure you discuss any questions you have with your health care provider. Document Released: 06/21/2009 Document Revised: 01/31/2016 Document Reviewed: 09/29/2014 Elsevier Interactive Patient Education  2017 [employer].

## 2020-04-12 NOTE — Patient Instructions (Signed)
Diabetes Mellitus and Exercise Exercising regularly is important for your overall health, especially when you have diabetes (diabetes mellitus). Exercising is not only about losing weight. It has many other health benefits, such as increasing muscle strength and bone density and reducing body fat and stress. This leads to improved fitness, flexibility, and endurance, all of which result in better overall health. Exercise has additional benefits for people with diabetes, including:  Reducing appetite.  Helping to lower and control blood glucose.  Lowering blood pressure.  Helping to control amounts of fatty substances (lipids) in the blood, such as cholesterol and triglycerides.  Helping the body to respond better to insulin (improving insulin sensitivity).  Reducing how much insulin the body needs.  Decreasing the risk for heart disease by: ? Lowering cholesterol and triglyceride levels. ? Increasing the levels of good cholesterol. ? Lowering blood glucose levels. What is my activity plan? Your health care provider or certified diabetes educator can help you make a plan for the type and frequency of exercise (activity plan) that works for you. Make sure that you:  Do at least 150 minutes of moderate-intensity or vigorous-intensity exercise each week. This could be brisk walking, biking, or water aerobics. ? Do stretching and strength exercises, such as yoga or weightlifting, at least 2 times a week. ? Spread out your activity over at least 3 days of the week.  Get some form of physical activity every day. ? Do not go more than 2 days in a row without some kind of physical activity. ? Avoid being inactive for more than 30 minutes at a time. Take frequent breaks to walk or stretch.  Choose a type of exercise or activity that you enjoy, and set realistic goals.  Start slowly, and gradually increase the intensity of your exercise over time. What do I need to know about managing my  diabetes?   Check your blood glucose before and after exercising. ? If your blood glucose is 240 mg/dL (13.3 mmol/L) or higher before you exercise, check your urine for ketones. If you have ketones in your urine, do not exercise until your blood glucose returns to normal. ? If your blood glucose is 100 mg/dL (5.6 mmol/L) or lower, eat a snack containing 15-20 grams of carbohydrate. Check your blood glucose 15 minutes after the snack to make sure that your level is above 100 mg/dL (5.6 mmol/L) before you start your exercise.  Know the symptoms of low blood glucose (hypoglycemia) and how to treat it. Your risk for hypoglycemia increases during and after exercise. Common symptoms of hypoglycemia can include: ? Hunger. ? Anxiety. ? Sweating and feeling clammy. ? Confusion. ? Dizziness or feeling light-headed. ? Increased heart rate or palpitations. ? Blurry vision. ? Tingling or numbness around the mouth, lips, or tongue. ? Tremors or shakes. ? Irritability.  Keep a rapid-acting carbohydrate snack available before, during, and after exercise to help prevent or treat hypoglycemia.  Avoid injecting insulin into areas of the body that are going to be exercised. For example, avoid injecting insulin into: ? The arms, when playing tennis. ? The legs, when jogging.  Keep records of your exercise habits. Doing this can help you and your health care provider adjust your diabetes management plan as needed. Write down: ? Food that you eat before and after you exercise. ? Blood glucose levels before and after you exercise. ? The type and amount of exercise you have done. ? When your insulin is expected to peak, if you use   insulin. Avoid exercising at times when your insulin is peaking.  When you start a new exercise or activity, work with your health care provider to make sure the activity is safe for you, and to adjust your insulin, medicines, or food intake as needed.  Drink plenty of water while  you exercise to prevent dehydration or heat stroke. Drink enough fluid to keep your urine clear or pale yellow. Summary  Exercising regularly is important for your overall health, especially when you have diabetes (diabetes mellitus).  Exercising has many health benefits, such as increasing muscle strength and bone density and reducing body fat and stress.  Your health care provider or certified diabetes educator can help you make a plan for the type and frequency of exercise (activity plan) that works for you.  When you start a new exercise or activity, work with your health care provider to make sure the activity is safe for you, and to adjust your insulin, medicines, or food intake as needed. This information is not intended to replace advice given to you by your health care provider. Make sure you discuss any questions you have with your health care provider. Document Revised: 03/19/2017 Document Reviewed: 02/04/2016 Elsevier Patient Education  2020 [employer]  

## 2020-04-12 NOTE — Progress Notes (Signed)
I,[name],acting as a Education administrator for Isaiah Greenland, MD.,have documented all relevant documentation on the behalf of Isaiah Greenland, MD,as directed by  Isaiah Greenland, MD while in the presence of Isaiah Greenland, MD.  This visit occurred during the SARS-CoV-2 public health emergency.  Safety protocols were in place, including screening questions prior to the visit, additional usage of staff PPE, and extensive cleaning of exam room while observing appropriate contact time as indicated for disinfecting solutions.  Subjective:     Patient ID: Isaiah [name] , male    DOB: 1966/03/27 , 54 y.o.   MRN: [id number]   Chief Complaint  Patient presents with  . Diabetes  . Hypertension    HPI  Patient presents today for diabetes check. He is on Basaglar and Humalog for his diabetes.  Pt is concerned about inconsistent blood pressure readings.  He is accompanied by his wife today.   Diabetes He presents for his follow-up diabetic visit. He has type 2 diabetes mellitus. His disease course has been stable. There are no hypoglycemic associated symptoms. Pertinent negatives for diabetes include no blurred vision. There are no hypoglycemic complications. Diabetic complications include nephropathy. Risk factors for coronary artery disease include diabetes mellitus, dyslipidemia, hypertension, male sex and obesity. He is following a diabetic diet.  Hypertension This is a chronic problem. The current episode started more than 1 year ago. The problem has been gradually improving since onset. The problem is controlled. Pertinent negatives include no anxiety or blurred vision. The current treatment provides moderate improvement.     Past Medical History:  Diagnosis Date  . A-fib ([place])   . Anemia   . Asthma   . DM type 2 (diabetes mellitus, type 2) ([place]) 06/09/2019  . ESRD (end stage renal disease) on dialysis ([place])   . Essential hypertension 06/09/2019  . GIB (gastrointestinal bleeding)    Recurrent  episodes- 09/2014, 09/2015 and 05/2016  . Gout   . History of recent blood transfusion 10/27/14   2 Units PRBC's  . Hyperkalemia 07/2011  . Multiple myeloma ([place])   . OSA on CPAP   . Pulmonary embolism ([place]) 07/2011; 09/27/2014   a. Bilat PE 07/2011 - unclear cause, tx with 6 months Coumadin.;   . Seizure disorder ([place]) 06/09/2019  . Sepsis ([place])   . Sickle cell-thalassemia disease ([place])    a. Sickle cell trait.  . Sleep apnea   . Stroke ([place]) 09/2015   R-MCA, L-MCA, PCA and bilateral cerebellar complicated by DVT/PE  . Subdural hematoma ([place]) 05/2019     Family History  Problem Relation Age of Onset  . Hypertension Mother   . Hypertension Father   . Stroke Father   . Sickle cell anemia Brother   . Diabetes Mother   . Sickle cell anemia Brother   . Diabetes type I Brother   . Kidney disease Brother      Current Outpatient Medications:  .  acetaminophen (TYLENOL) 325 MG tablet, Take 1-2 tablets (325-650 mg total) by mouth every 4 (four) hours as needed for mild pain., Disp:  , Rfl:  .  albuterol (PROAIR HFA) 108 (90 Base) MCG/ACT inhaler, Inhale two puffs every 4-6 hours if needed for cough or wheeze (Patient taking differently: Inhale 2 puffs into the lungs every 4 (four) hours as needed for wheezing or shortness of breath. Inhale two puffs every 4-6 hours if needed for cough or wheeze), Disp: 6.7 g, Rfl: 2 .  Alcohol Swabs (SM ALCOHOL PREP)  70 % PADS, , Disp: , Rfl:  .  atorvastatin (LIPITOR) 20 MG tablet, TAKE 1 TABLET BY MOUTH ONCE A DAY, Disp: 30 tablet, Rfl: 0 .  AURYXIA 1 GM 210 MG(Fe) tablet, Take 210 mg by mouth 3 (three) times daily., Disp: , Rfl:  .  BREO ELLIPTA 200-25 MCG/INH AEPB, INHALE 1 PUFF BY MOUTH AT THE SAME TIME EVERY DAY (Patient taking differently: Inhale 1 puff into the lungs daily. ), Disp: 60 each, Rfl: 2 .  brimonidine (ALPHAGAN) 0.2 % ophthalmic solution, Place 1 drop into the right eye 2 (two) times daily. , Disp: , Rfl:  .  camphor-menthol (SARNA)  lotion, Apply 1 application topically every 8 (eight) hours as needed for itching., Disp: 222 mL, Rfl: 0 .  Cetirizine HCl 10 MG CAPS, Take 1 capsule by mouth daily. , Disp: , Rfl:  .  clonazePAM (KLONOPIN) 0.5 MG tablet, Take 2 tablets every Monday, Wednesday, and Friday with hemodialysis (Patient taking differently: Take 1 mg by mouth every Monday, Wednesday, and Friday. Before dialysis), Disp: 30 tablet, Rfl: 5 .  FREESTYLE LITE test strip, , Disp: , Rfl:  .  hydrOXYzine (ATARAX/VISTARIL) 25 MG tablet, TAKE 1 TABLET (25 MG TOTAL) BY MOUTH EVERY 8 (EIGHT) HOURS AS NEEDED FOR ITCHING., Disp: 30 tablet, Rfl: 0 .  Insulin Glargine (BASAGLAR KWIKPEN) 100 UNIT/ML, Inject 10 Units into the skin daily. , Disp: , Rfl:  .  Insulin Lispro (HUMALOG KWIKPEN [place]), Inject 2-4 Units into the skin 3 (three) times daily before meals. Sliding scale: 150-250 =2 units, 250 -350=3 units, 350 += 4 units, Disp: , Rfl:  .  lacosamide (VIMPAT) 200 MG TABS tablet, Take 1 tablet twice a day except on dialysis days M-W-F give 1 tab in AM, 1 tab after dialysis, 1 tab in PM., Disp: 90 tablet, Rfl: 5 .  lamoTRIgine (LAMICTAL) 200 MG tablet, Take 1 tablet (200 mg total) by mouth 2 (two) times daily., Disp: 60 tablet, Rfl: 11 .  Lancets (FREESTYLE) lancets, , Disp: , Rfl:  .  metoprolol tartrate (LOPRESSOR) 25 MG tablet, Take 25 mg by mouth daily., Disp: , Rfl:  .  metroNIDAZOLE (FLAGYL) 250 MG tablet, Take 1 tablet (250 mg total) by mouth every 8 (eight) hours., Disp: 9 tablet, Rfl: 0 .  midodrine (PROAMATINE) 10 MG tablet, TAKE 1 TABLET BY MOUTH TWICE DAILY WITH MEALS, Disp: 60 tablet, Rfl: 0 .  multivitamin (RENA-VIT) TABS tablet, Take 1 tablet by mouth at bedtime., Disp: , Rfl:  .  Nutritional Supplements (FEEDING SUPPLEMENT, NEPRO CARB STEADY,) LIQD, Take 237 mLs by mouth 3 (three) times daily as needed (Supplement)., Disp: 237 mL, Rfl: 12 .  pantoprazole (PROTONIX) 40 MG tablet, TAKE 1 TABLET BY MOUTH ONCE DAILY (Patient  taking differently: Take 40 mg by mouth daily. ), Disp: 90 tablet, Rfl: 1 .  prochlorperazine (COMPAZINE) 10 MG tablet, TAKE 1 TABLET (10 MG TOTAL) BY MOUTH EVERY 6 (SIX) HOURS AS NEEDED FOR NAUSEA OR VOMITING., Disp: 30 tablet, Rfl: 0 .  UNIFINE PENTIPS 31G X 8 MM MISC, USE AS DIRECTED DAILY AFTER SUPPER, Disp: 100 each, Rfl: 3   Allergies  Allergen Reactions  . Codeine Swelling  . Codeine Rash and Other (See Comments)    Unknown reaction (patient says it was more serious than just a rash, but he can't remember what happened)      Review of Systems  Constitutional: Negative.   Eyes: Negative for blurred vision.  Respiratory: Negative.   Cardiovascular:  Negative.   Gastrointestinal: Negative.   Neurological: Negative.      Today's Vitals   04/12/20 1553  BP: 130/80  Pulse: 89  Temp: (!) 97.5 F (36.4 C)  TempSrc: Oral  Weight: 189 lb (85.7 kg)  Height: 5' 10" (1.778 m)  PainSc: 0-No pain   Body mass index is 27.12 kg/m.   Objective:  Physical Exam Vitals and nursing note reviewed.  Constitutional:      Appearance: Normal appearance.  Cardiovascular:     Rate and Rhythm: Normal rate and regular rhythm.     Heart sounds: Normal heart sounds.  Pulmonary:     Effort: Pulmonary effort is normal.     Breath sounds: Normal breath sounds.  Skin:    General: Skin is warm.  Neurological:     General: No focal deficit present.     Mental Status: He is alert.  Psychiatric:        Mood and Affect: Mood normal.         Assessment And Plan:     1. Diabetes mellitus with end-stage renal disease ([place]) Comments: Chronic. Stable. Will continue with current medication.  He is also under the care of Endocrinology.   2. Benign hypertensive kidney disease with chronic kidney disease stage V or end stage renal disease ([place]) Comments: Chronic, today's reading is acceptable. He will continue with current meds for now Encouraged to limit salt restriction. He is encouraged to keep  a BP log.  3. ESRD on dialysis ([place])  4. Overweight with body mass index (BMI) of 27 to 27.9 in adult Comments: His weight is stable.   5. Encounter for HCV screening test for low risk patient Comments: I will send order with him to take to dialysis to check HCV-antibody. .   Patient advised to strive for bmi less than 26. He is encouraged to exercise at least 150 minutes per week as tolerated. Wt Readings from Last 3 Encounters:  04/24/20 188 lb 1.6 oz (85.3 kg)  04/12/20 189 lb (85.7 kg)  04/12/20 187 lb (84.8 kg)     Patient was given opportunity to ask questions. Patient verbalized understanding of the plan and was able to repeat key elements of the plan. All questions were answered to their satisfaction.  Isaiah Greenland, MD   I, Isaiah Greenland, MD, have reviewed all documentation for this visit. The documentation on 05/13/20 for the exam, diagnosis, procedures, and orders are all accurate and complete.  THE PATIENT IS ENCOURAGED TO PRACTICE SOCIAL DISTANCING DUE TO THE COVID-19 PANDEMIC.

## 2020-04-12 NOTE — Progress Notes (Signed)
[facility] Note  04/12/2020     CHIEF COMPLAINT Patient presents for Retina Follow Up   HISTORY OF PRESENT ILLNESS: Isaiah [name] is a 54 y.o. male who presents to the clinic today for:   HPI    Retina Follow Up    Patient presents with  Other.  In right eye.  This started weeks ago.  Severity is moderate.  Duration of weeks.  Since onset it is gradually improving.  I, the attending physician,  performed the HPI with the patient and updated documentation appropriately.          Comments    Patient states vision has improved since last visit in his right eye.  Patient states he thinks he may see a little better without his glasses.  Patient has occasional eye pain OD.  Patient denies eye pain or discomfort OS.  Patient denies any new or worsening floaters or fol OU.       Last edited by [name], MD on 04/12/2020  2:35 PM. (History)    Pt states vision is improving, wife states he complains about not being able to see with his glasses on, pt has an appt with Dr. [name] on August 17  Referring physician: [name], [address] [place],  [place] [address]  HISTORICAL INFORMATION:   Selected notes from the [facility] of Dr. [name].  Urgent re-referral from Dr. [name] for possible PDR w/NVI OD.  Presented to [facility] on 7.27.21 w/elevated IOPS and OD swelling and irritation.  Started on Brimonidine BID OD and Pred Q1hr OD.    CURRENT MEDICATIONS: Current Outpatient Medications (Ophthalmic Drugs)  Medication Sig  . brimonidine (ALPHAGAN) 0.2 % ophthalmic solution Place 1 drop into the right eye 2 (two) times daily.  . prednisoLONE acetate (PRED FORTE) 1 % ophthalmic suspension Place 1 drop into the right eye every hour while awake.   No current facility-administered medications for this visit. (Ophthalmic Drugs)   Current Outpatient Medications (Other)  Medication Sig  . acetaminophen (TYLENOL) 325  MG tablet Take 1-2 tablets (325-650 mg total) by mouth every 4 (four) hours as needed for mild pain.  [name] acyclovir (ZOVIRAX) 400 MG tablet TAKE 1 TABLET BY MOUTH ONCE DAILY (Patient not taking: Reported on 04/05/2020)  . albuterol (PROAIR HFA) 108 (90 Base) MCG/ACT inhaler Inhale two puffs every 4-6 hours if needed for cough or wheeze (Patient taking differently: Inhale 2 puffs into the lungs every 4 (four) hours as needed for wheezing or shortness of breath. Inhale two puffs every 4-6 hours if needed for cough or wheeze)  . Alcohol Swabs (SM ALCOHOL PREP) 70 % PADS   . atorvastatin (LIPITOR) 20 MG tablet TAKE 1 TABLET BY MOUTH ONCE A DAY  . AURYXIA 1 GM 210 MG(Fe) tablet Take 210 mg by mouth 3 (three) times daily.  [name] BREO ELLIPTA 200-25 MCG/INH AEPB INHALE 1 PUFF BY MOUTH AT THE SAME TIME EVERY DAY (Patient taking differently: Inhale 1 puff into the lungs daily. )  . camphor-menthol (SARNA) lotion Apply 1 application topically every 8 (eight) hours as needed for itching.  . Cetirizine HCl 10 MG CAPS Take 1 capsule by mouth daily. (Patient not taking: Reported on 04/05/2020)  . ciprofloxacin (CIPRO) 250 MG tablet Take 1 tablet (250 mg total) by mouth daily with breakfast. (Patient not taking: Reported on 03/29/2020)  . clonazePAM (KLONOPIN) 0.5 MG tablet Take 2 tablets every Monday,  Wednesday, and Friday with hemodialysis (Patient taking differently: Take 1 mg by mouth every Monday, Wednesday, and Friday. Before dialysis)  . dexamethasone (DECADRON) 4 MG tablet Take 5 tablets (20 mg total) by mouth once a week. Take 5 tablets (20 mg total) by mouth once a week on day of chemo. (Patient not taking: Reported on 03/29/2020)  . FREESTYLE LITE test strip   . hydrOXYzine (ATARAX/VISTARIL) 25 MG tablet Take 1 tablet (25 mg total) by mouth every 8 (eight) hours as needed for itching.  . Insulin Glargine (BASAGLAR KWIKPEN) 100 UNIT/ML Inject 10 Units into the skin daily.   . Insulin Lispro (HUMALOG KWIKPEN [place])  Inject 2-4 Units into the skin 3 (three) times daily before meals. Sliding scale: 150-250 =2 units, 250 -350=3 units, 350 += 4 units  . lacosamide (VIMPAT) 200 MG TABS tablet Take 1 tablet twice a day except on dialysis days M-W-F give 1 tab in AM, 1 tab after dialysis, 1 tab in PM. (Patient taking differently: Take 200 mg by mouth See admin instructions. Take 1 tablet twice a day except on dialysis days M-W-F give 1 tab in AM, 1 tab after dialysis, 1 tab in PM.)  . lamoTRIgine (LAMICTAL) 200 MG tablet Take 1 tablet (200 mg total) by mouth 2 (two) times daily.  . Lancets (FREESTYLE) lancets   . metoprolol tartrate (LOPRESSOR) 25 MG tablet Take 25 mg by mouth daily.  . metroNIDAZOLE (FLAGYL) 250 MG tablet Take 1 tablet (250 mg total) by mouth every 8 (eight) hours. (Patient not taking: Reported on 03/29/2020)  . midodrine (PROAMATINE) 10 MG tablet TAKE 1 TABLET BY MOUTH TWICE DAILY WITH MEALS  . multivitamin (RENA-VIT) TABS tablet Take 1 tablet by mouth at bedtime.  . Nutritional Supplements (FEEDING SUPPLEMENT, NEPRO CARB STEADY,) LIQD Take 237 mLs by mouth 3 (three) times daily as needed (Supplement).  . pantoprazole (PROTONIX) 40 MG tablet TAKE 1 TABLET BY MOUTH ONCE DAILY (Patient taking differently: Take 40 mg by mouth daily. )  . prochlorperazine (COMPAZINE) 10 MG tablet TAKE 1 TABLET (10 MG TOTAL) BY MOUTH EVERY 6 (SIX) HOURS AS NEEDED FOR NAUSEA OR VOMITING.  [name] UNIFINE PENTIPS 31G X 8 MM MISC USE AS DIRECTED DAILY AFTER SUPPER   No current facility-administered medications for this visit. (Other)      REVIEW OF SYSTEMS: ROS    Positive for: Neurological, Genitourinary, Endocrine, Eyes   Negative for: Constitutional, Gastrointestinal, Skin, Musculoskeletal, HENT, Cardiovascular, Respiratory, Psychiatric, Allergic/Imm, Heme/Lymph   Last edited by Doneen Poisson on 04/12/2020  8:56 AM. (History)       ALLERGIES Allergies  Allergen Reactions  . Codeine Swelling  . Codeine Rash and  Other (See Comments)    Unknown reaction (patient says it was more serious than just a rash, but he can't remember what happened)     PAST MEDICAL HISTORY Past Medical History:  Diagnosis Date  . A-fib ([place])   . Anemia   . Asthma   . DM type 2 (diabetes mellitus, type 2) ([place]) 06/09/2019  . ESRD (end stage renal disease) on dialysis ([place])   . Essential hypertension 06/09/2019  . GIB (gastrointestinal bleeding)    Recurrent episodes- 09/2014, 09/2015 and 05/2016  . Gout   . History of recent blood transfusion 10/27/14   2 Units PRBC's  . Hyperkalemia 07/2011  . Multiple myeloma ([place])   . OSA on CPAP   . Pulmonary embolism ([place]) 07/2011; 09/27/2014   a. Bilat PE 07/2011 - unclear cause,  tx with 6 months Coumadin.;   . Seizure disorder ([place]) 06/09/2019  . Sepsis ([place])   . Sickle cell-thalassemia disease ([place])    a. Sickle cell trait.  . Sleep apnea   . Stroke ([place]) 09/2015   R-MCA, L-MCA, PCA and bilateral cerebellar complicated by DVT/PE  . Subdural hematoma ([place]) 05/2019   Past Surgical History:  Procedure Laterality Date  . AV FISTULA PLACEMENT Right 12/05/2015   Procedure: INSERTION OF ARTERIOVENOUS (AV) GORE-TEX GRAFT ARM;  Surgeon: Elam Dutch, MD;  Location: [facility];  Service: Vascular;  Laterality: Right;  . BONE MARROW BIOPSY    . CATARACT EXTRACTION  08/2019  . CATARACT EXTRACTION  09/2019  . CHOLECYSTECTOMY    . CHOLECYSTECTOMY  1990's?  . COLONOSCOPY WITH PROPOFOL N/A 01/22/2017   Procedure: COLONOSCOPY WITH PROPOFOL;  Surgeon: Wilford Corner, MD;  Location: [facility];  Service: Endoscopy;  Laterality: N/A;  . EYE SURGERY Right   . INSERTION OF DIALYSIS CATHETER Right 10/16/2015   Procedure: INSERTION OF PALINDROME DIALYSIS CATHETER ;  Surgeon: Elam Dutch, MD;  Location: [facility];  Service: Vascular;  Laterality: Right;  . OTHER SURGICAL HISTORY     Retinal surgery  . PARS PLANA VITRECTOMY  02/17/2012   Procedure: PARS PLANA VITRECTOMY WITH 25 GAUGE;   Surgeon: Hayden Pedro, MD;  Location: [facility];  Service: Ophthalmology;  Laterality: Right;  . PERIPHERAL VASCULAR CATHETERIZATION N/A 03/20/2016   Procedure: A/V Shuntogram/Fistulagram;  Surgeon: Conrad Lake Holiday, MD;  Location: [facility];  Service: Cardiovascular;  Laterality: N/A;  . TEE WITHOUT CARDIOVERSION N/A 12/02/2016   Procedure: TRANSESOPHAGEAL ECHOCARDIOGRAM (TEE);  Surgeon: Larey Dresser, MD;  [facility] END[facility]COPY;  Service: Cardiovascular;  Laterality: N/A;    FAMILY HISTORY Family History  Problem Relation Age of Onset  . Hypertension Mother   . Hypertension Father   . Stroke Father   . Sickle cell anemia Brother   . Diabetes Mother   . Sickle cell anemia Brother   . Diabetes type I Brother   . Kidney disease Brother     SOCIAL HISTORY Social History   Tobacco Use  . Smoking status: Never Smoker  . Smokeless tobacco: Never Used  Vaping Use  . Vaping Use: Never used  Substance Use Topics  . Alcohol use: Not Currently  . Drug use: Never         OPHTHALMIC EXAM:  Base Eye Exam    Visual Acuity (Snellen - Linear)      Right Left   Dist [place] 20/100 -2 20/100 -2   Dist cc 20/100 -2 20/150 +1   Dist ph cc 20/80 -1 20/100   Correction: Glasses       Tonometry (Tonopen, 9:02 AM)      Right Left   Pressure 11 11       Pupils      Dark Light Shape React APD   Right 5 5 Irregular NR 0   Left 3 2 Round Minimal 0       Visual Fields      Left Right     Full   Restrictions Partial outer superior temporal deficiency        Extraocular Movement      Right Left    Full Full       Neuro/Psych    Oriented x3: Yes   Mood/Affect: Normal       Dilation    Both eyes: 1.0% Mydriacyl, 2.5% Phenylephrine @  9:03 AM        Slit Lamp and Fundus Exam    Slit Lamp Exam      Right Left   Lids/Lashes Dermato Dermato   Conjunctiva/Sclera Melanosis Melanosis   Cornea Arcus well healed cataract wound, 2+-3+ PEE 3+ PEE, mild cataract wounds    Anterior Chamber 2-3+ cell & pigment, trace layered hyphema inferiorly (<0.74m); no hypopyon Deep.  0.5+ pigment   Iris Round and poorly dilated to 4.524m  Prominent vessels/early NVI greatest superiorly and temporally -- regressing Round and dilated   Lens PCIOL.  Mild RBC settled on inferior capsule PCIOL   Vitreous Post vitrectomy.  Residual anterior vitreous syneresis.  +RBC - improved Mild asteroid hyalosis, syneresis       Fundus Exam      Right Left   Disc Mild pallor. Sharp rim. Hazy view.  Perfused.   C/D Ratio 0.6 0.5   Macula Hazy view.  Blunted foveal reflex. Hazy view.  Grossly flat.   Vessels Attenuated, tortuous Attenuated, tortuous   Periphery Attached, Peripheral laser scars, room for fill in, Hazy view. Scattered IRViolattached.  360 PRP        Refraction    Wearing Rx      Sphere Cylinder Axis Add   Right -1.75 +0.50 090 +2.50   Left -1.50 Sphere  +2.50          IMAGING AND PROCEDURES  Imaging and Procedures for 04/12/2020  OCT, Retina - OU - Both Eyes       Right Eye Quality was borderline. Central Foveal Thickness: 271. Progression has improved. Findings include normal foveal contour, no IRF, no SRF, outer retinal atrophy, vitreomacular adhesion  (interval improvement in vitreous opacities; diffuse retinal atrophy).   Left Eye Quality was good. Central Foveal Thickness: 252. Progression has been stable. Findings include normal foveal contour, no SRF (Diffuse retinal atrophy/thinning; retinoschisis temporal midzone caught on widefield).   Notes *Images captured and stored on drive  Diagnosis / Impression:  OD: interval improvement in vitreous opacities; diffuse retinal atrophy OS: Diffuse retinal atrophy/thinning; retinoschisis temporal midzone caught on widefield -- stable  Clinical management:  See below  Abbreviations: NFP - Normal foveal profile. CME - cystoid macular edema. PED - pigment epithelial detachment. IRF - intraretinal fluid. SRF -  subretinal fluid. EZ - ellipsoid zone. ERM - epiretinal membrane. ORA - outer retinal atrophy. ORT - outer retinal tubulation. SRHM - subretinal hyper-reflective material. IRHM - intraretinal hyper-reflective material                ASSESSMENT/PLAN:    ICD-10-CM   1. Vitreous hemorrhage, right eye (HCC)  H43.11   2. Hyphema of right eye  H21.01   3. Proliferative diabetic retinopathy of both eyes without macular edema associated with type 2 diabetes mellitus ([place]. [place] [id number] 4. Essential hypertension  I10   5. Bilateral dry eyes  H04.123   6. Pseudophakia of both eyes  Z96.1   7. Sickle cell retinopathy without crisis ([place]CFult[place] D57.1    H36   8. Retinal edema  H35.81 OCT, Retina - OU - Both Eyes  9. Hypertensive retinopathy of both eyes  H35.033    1,2. VH & microyphema OD secondary to PDR  - s/p IVA OD #1 (07.29.21)  - VH and microhyphema improving  - NVI OD regressing      - On Brimonidine BID OD -- okay to stop      - On PF q1hr OD --  decrease to 6x a day      - cont Cosopt BID OD      - cont cyclopentalate BID OD  - VH precautions reviewed -- minimize activities, keep head elevated, avoid ASA/NSAIDs/blood thinners as able  - F/u in 1 wk w/DFE/OCT/Optos FA (Transit OD)  3,4. Proliferative diabetic retinopathy w/ retinal ischemia, no DME, OU (OD > OS)  - pt of Dr. [name], last visit 3.4.21  - history of PPV w/ endolaser, FAX OD 6.11.13; PRP OS 2020  - exam shows NVI and VH improving OD; OS with good 360 PRP and +asteroid hyalosis  - OCT without diabetic macular edema, but diffuse atrophy, both eyes   - discussed findings and prognosis  - s/p IVA OD #1(7.29.21) as above  5. History of Sickle Cell Retinopathy  - 360 PRP in place OU  6,7. Hypertensive retinopathy OU - discussed importance of tight BP control - monitor  8. Pseudophakia OU  - s/p CE/IOL OU (OD Jan 2021; OS Feb 2021  - IOL in good position, doing well  - monitor  9. Dry eyes OU - recommend  artificial tears and lubricating ointment as needed  Ophthalmic Meds Ordered this visit:  No orders of the defined types were placed in this encounter.      Return in about 1 week (around 04/19/2020) for f/u 1 week, VH OD, DFE, OCT, FA (transit OD).  There are no Patient Instructions on file for this visit.   Explained the diagnoses, plan, and follow up with the patient and they expressed understanding.  Patient expressed understanding of the importance of proper follow up care.  This document serves as a record of services personally performed by Gardiner Sleeper, MD, PhD. It was created on their behalf by Leeann Must, [place], an ophthalmic technician. The creation of this record is the provider's dictation and/or activities during the visit.    Electronically signed by: Leeann Must, COA @TODAY @ 2:39 PM  Gardiner Sleeper, M.D., Ph.D. Diseases & Surgery of the Retina and Vitreous [facility]  I have reviewed the above documentation for accuracy and completeness, and I agree with the above. Gardiner Sleeper, M.D., Ph.D. 04/12/20 2:39 PM   Abbreviations: M myopia (nearsighted); A astigmatism; H hyperopia (farsighted); P presbyopia; Mrx spectacle prescription;  CTL contact lenses; OD right eye; OS left eye; OU both eyes  XT exotropia; ET esotropia; PEK punctate epithelial keratitis; PEE punctate epithelial erosions; DES dry eye syndrome; MGD meibomian gland dysfunction; ATs artificial tears; PFAT's preservative free artificial tears; [place] nuclear sclerotic cataract; PSC posterior subcapsular cataract; ERM epi-retinal membrane; PVD posterior vitreous detachment; RD retinal detachment; DM diabetes mellitus; DR diabetic retinopathy; NPDR non-proliferative diabetic retinopathy; PDR proliferative diabetic retinopathy; CSME clinically significant macular edema; DME diabetic macular edema; dbh dot blot hemorrhages; CWS cotton wool spot; POAG primary open angle glaucoma; C/D  cup-to-disc ratio; HVF humphrey visual field; GVF goldmann visual field; OCT optical coherence tomography; IOP intraocular pressure; BRVO Branch retinal vein occlusion; CRVO central retinal vein occlusion; CRAO central retinal artery occlusion; BRAO branch retinal artery occlusion; RT retinal tear; SB scleral buckle; PPV pars plana vitrectomy; VH Vitreous hemorrhage; PRP panretinal laser photocoagulation; IVK intravitreal kenalog; VMT vitreomacular traction; MH Macular hole;  NVD neovascularization of the disc; NVE neovascularization elsewhere; AREDS age related eye disease study; ARMD age related macular degeneration; POAG primary open angle glaucoma; EBMD epithelial/anterior basement membrane dystrophy; ACIOL anterior chamber intraocular lens; IOL intraocular lens; PCIOL posterior chamber intraocular  lens; Phaco/IOL phacoemulsification with intraocular lens placement; [place] photorefractive keratectomy; LASIK laser assisted in situ keratomileusis; HTN hypertension; DM diabetes mellitus; COPD chronic obstructive pulmonary disease

## 2020-04-12 NOTE — Progress Notes (Signed)
This visit occurred during the SARS-CoV-2 public health emergency.  Safety protocols were in place, including screening questions prior to the visit, additional usage of staff PPE, and extensive cleaning of exam room while observing appropriate contact time as indicated for disinfecting solutions.  Subjective:   Isaiah [name] is a 54 y.o. male who presents for Medicare Annual/Subsequent preventive examination.  Review of Systems      Cardiac Risk Factors include: advanced age (>45mn, >>81women);diabetes mellitus;male gender;sedentary lifestyle     Objective:    Today's Vitals   04/12/20 1518  BP: 130/80  Pulse: 89  Temp: (!) 97.5 F (36.4 C)  TempSrc: Oral  SpO2: 99%  Weight: 187 lb (84.8 kg)  Height: 5' 10"  (1.778 m)   Body mass index is 26.83 kg/m.  Advanced Directives 04/12/2020 03/29/2020 03/15/2020 03/15/2020 03/08/2020 02/28/2020 02/21/2020  Does Patient Have a Medical Advance Directive? Yes Yes No No Yes Yes Yes  Type of AParamedicof ABasileLiving will HWendoverLiving will - - [place]St. Hel[place] will - -  Does patient want to make changes to medical advance directive? - No - Patient declined - - - - No - Patient declined  Copy of [place] Chart? No - copy requested - - - Yes - validated most recent copy scanned in chart (See row information) - -  Would patient like information on creating a medical advance directive? - - No - Patient declined - - - -  Pre-existing out of facility DNR order (yellow form or pink MOST form) - - - - - - -    Current Medications (verified) Outpatient Encounter Medications as of 04/12/2020  Medication Sig  . acetaminophen (TYLENOL) 325 MG tablet Take 1-2 tablets (325-650 mg total) by mouth every 4 (four) hours as needed for mild pain.  .Marland Kitchenalbuterol (PROAIR HFA) 108 (90 Base) MCG/ACT inhaler Inhale two puffs every 4-6 hours if needed for cough or wheeze (Patient taking  differently: Inhale 2 puffs into the lungs every 4 (four) hours as needed for wheezing or shortness of breath. Inhale two puffs every 4-6 hours if needed for cough or wheeze)  . Alcohol Swabs (SM ALCOHOL PREP) 70 % PADS   . atorvastatin (LIPITOR) 20 MG tablet TAKE 1 TABLET BY MOUTH ONCE A DAY  . AURYXIA 1 GM 210 MG(Fe) tablet Take 210 mg by mouth 3 (three) times daily.  .Marland KitchenBREO ELLIPTA 200-25 MCG/INH AEPB INHALE 1 PUFF BY MOUTH AT THE SAME TIME EVERY DAY (Patient taking differently: Inhale 1 puff into the lungs daily. )  . camphor-menthol (SARNA) lotion Apply 1 application topically every 8 (eight) hours as needed for itching.  . Cetirizine HCl 10 MG CAPS Take 1 capsule by mouth daily.   . clonazePAM (KLONOPIN) 0.5 MG tablet Take 2 tablets every Monday, Wednesday, and Friday with hemodialysis (Patient taking differently: Take 1 mg by mouth every Monday, Wednesday, and Friday. Before dialysis)  . FREESTYLE LITE test strip   . hydrOXYzine (ATARAX/VISTARIL) 25 MG tablet Take 1 tablet (25 mg total) by mouth every 8 (eight) hours as needed for itching.  . Insulin Glargine (BASAGLAR KWIKPEN) 100 UNIT/ML Inject 10 Units into the skin daily.   . Insulin Lispro (HUMALOG KWIKPEN [place]) Inject 2-4 Units into the skin 3 (three) times daily before meals. Sliding scale: 150-250 =2 units, 250 -350=3 units, 350 += 4 units  . lacosamide (VIMPAT) 200 MG TABS tablet Take 1 tablet twice a  day except on dialysis days M-W-F give 1 tab in AM, 1 tab after dialysis, 1 tab in PM. (Patient taking differently: Take 200 mg by mouth See admin instructions. Take 1 tablet twice a day except on dialysis days M-W-F give 1 tab in AM, 1 tab after dialysis, 1 tab in PM.)  . lamoTRIgine (LAMICTAL) 200 MG tablet Take 1 tablet (200 mg total) by mouth 2 (two) times daily.  . Lancets (FREESTYLE) lancets   . metoprolol tartrate (LOPRESSOR) 25 MG tablet Take 25 mg by mouth daily.  . midodrine (PROAMATINE) 10 MG tablet TAKE 1 TABLET BY MOUTH TWICE  DAILY WITH MEALS  . multivitamin (RENA-VIT) TABS tablet Take 1 tablet by mouth at bedtime.  . Nutritional Supplements (FEEDING SUPPLEMENT, NEPRO CARB STEADY,) LIQD Take 237 mLs by mouth 3 (three) times daily as needed (Supplement).  . pantoprazole (PROTONIX) 40 MG tablet TAKE 1 TABLET BY MOUTH ONCE DAILY (Patient taking differently: Take 40 mg by mouth daily. )  . prednisoLONE acetate (PRED FORTE) 1 % ophthalmic suspension Place 1 drop into the right eye every hour while awake.  . prochlorperazine (COMPAZINE) 10 MG tablet TAKE 1 TABLET (10 MG TOTAL) BY MOUTH EVERY 6 (SIX) HOURS AS NEEDED FOR NAUSEA OR VOMITING.  [name] UNIFINE PENTIPS 31G X 8 MM MISC USE AS DIRECTED DAILY AFTER SUPPER  . brimonidine (ALPHAGAN) 0.2 % ophthalmic solution Place 1 drop into the right eye 2 (two) times daily. (Patient not taking: Reported on 04/12/2020)  . metroNIDAZOLE (FLAGYL) 250 MG tablet Take 1 tablet (250 mg total) by mouth every 8 (eight) hours. (Patient not taking: Reported on 03/29/2020)  . [DISCONTINUED] acyclovir (ZOVIRAX) 400 MG tablet TAKE 1 TABLET BY MOUTH ONCE DAILY (Patient not taking: Reported on 04/05/2020)  . [DISCONTINUED] ciprofloxacin (CIPRO) 250 MG tablet Take 1 tablet (250 mg total) by mouth daily with breakfast. (Patient not taking: Reported on 03/29/2020)  . [DISCONTINUED] dexamethasone (DECADRON) 4 MG tablet Take 5 tablets (20 mg total) by mouth once a week. Take 5 tablets (20 mg total) by mouth once a week on day of chemo. (Patient not taking: Reported on 03/29/2020)   No facility-administered encounter medications on file as of 04/12/2020.    Allergies (verified) Codeine and Codeine   History: Past Medical History:  Diagnosis Date  . A-fib ([place])   . Anemia   . Asthma   . DM type 2 (diabetes mellitus, type 2) ([place]) 06/09/2019  . ESRD (end stage renal disease) on dialysis ([place])   . Essential hypertension 06/09/2019  . GIB (gastrointestinal bleeding)    Recurrent episodes- 09/2014, 09/2015 and  05/2016  . Gout   . History of recent blood transfusion 10/27/14   2 Units PRBC's  . Hyperkalemia 07/2011  . Multiple myeloma ([place])   . OSA on CPAP   . Pulmonary embolism ([place]) 07/2011; 09/27/2014   a. Bilat PE 07/2011 - unclear cause, tx with 6 months Coumadin.;   . Seizure disorder ([place]) 06/09/2019  . Sepsis ([place])   . Sickle cell-thalassemia disease ([place])    a. Sickle cell trait.  . Sleep apnea   . Stroke ([place]) 09/2015   R-MCA, L-MCA, PCA and bilateral cerebellar complicated by DVT/PE  . Subdural hematoma ([place]) 05/2019   Past Surgical History:  Procedure Laterality Date  . AV FISTULA PLACEMENT Right 12/05/2015   Procedure: INSERTION OF ARTERIOVENOUS (AV) GORE-TEX GRAFT ARM;  Surgeon: Elam Dutch, MD;  Location: [facility];  Service: Vascular;  Laterality: Right;  . BONE  MARROW BIOPSY    . CATARACT EXTRACTION  08/2019  . CATARACT EXTRACTION  09/2019  . CHOLECYSTECTOMY    . CHOLECYSTECTOMY  1990's?  . COLONOSCOPY WITH PROPOFOL N/A 01/22/2017   Procedure: COLONOSCOPY WITH PROPOFOL;  Surgeon: Wilford Corner, MD;  Location: [facility];  Service: Endoscopy;  Laterality: N/A;  . EYE SURGERY Right   . INSERTION OF DIALYSIS CATHETER Right 10/16/2015   Procedure: INSERTION OF PALINDROME DIALYSIS CATHETER ;  Surgeon: Elam Dutch, MD;  Location: [facility];  Service: Vascular;  Laterality: Right;  . OTHER SURGICAL HISTORY     Retinal surgery  . PARS PLANA VITRECTOMY  02/17/2012   Procedure: PARS PLANA VITRECTOMY WITH 25 GAUGE;  Surgeon: Hayden Pedro, MD;  Location: [facility];  Service: Ophthalmology;  Laterality: Right;  . PERIPHERAL VASCULAR CATHETERIZATION N/A 03/20/2016   Procedure: A/V Shuntogram/Fistulagram;  Surgeon: Conrad Mission Hill, MD;  Location: [facility];  Service: Cardiovascular;  Laterality: N/A;  . TEE WITHOUT CARDIOVERSION N/A 12/02/2016   Procedure: TRANSESOPHAGEAL ECHOCARDIOGRAM (TEE);  Surgeon: Larey Dresser, MD;  Location: [facility] ENDOSCOPY;  Service: Cardiovascular;   Laterality: N/A;   Family History  Problem Relation Age of Onset  . Hypertension Mother   . Hypertension Father   . Stroke Father   . Sickle cell anemia Brother   . Diabetes Mother   . Sickle cell anemia Brother   . Diabetes type I Brother   . Kidney disease Brother    Social History   Socioeconomic History  . Marital status: Married    Spouse name: sylvia  . Number of children: 1  . Years of education: college  . Highest education level: Not on file  Occupational History  . Occupation: [employer]  . Occupation: disability  Tobacco Use  . Smoking status: Never Smoker  . Smokeless tobacco: Never Used  Vaping Use  . Vaping Use: Never used  Substance and Sexual Activity  . Alcohol use: Not Currently  . Drug use: Never  . Sexual activity: Yes    Partners: Female  Other Topics Concern  . Not on file  Social History Narrative   ** Merged History Encounter **       Pt lives in 2 story home with his wife and 1 son Has masters degree in psychology Currently disabled.     Social Determinants of Health   Financial Resource Strain: Low Risk   . Difficulty of Paying Living Expenses: Not hard at all  Food Insecurity: No Food Insecurity  . Worried About Charity fundraiser in the Last Year: Never true  . Ran Out of Food in the Last Year: Never true  Transportation Needs: No Transportation Needs  . Lack of Transportation (Medical): No  . Lack of Transportation (Non-Medical): No  Physical Activity: Insufficiently Active  . Days of Exercise per Week: 4 days  . Minutes of Exercise per Session: 30 min  Stress: No Stress Concern Present  . Feeling of Stress : Not at all  Social Connections:   . Frequency of Communication with Friends and Family:   . Frequency of Social Gatherings with Friends and Family:   . Attends Religious Services:   . Active Member of Clubs or Organizations:   . Attends Archivist Meetings:   [name] Marital Status:     Tobacco  Counseling Counseling given: Not Answered   Clinical Intake:  Pre-visit preparation completed: Yes  Pain : No/denies pain     Nutritional Status:  BMI 25 -29 Overweight Nutritional Risks: None Diabetes: Yes  How often do you need to have someone help you when you read instructions, pamphlets, or other written materials from your doctor or pharmacy?: 1 - Never  Diabetic? Yes Nutrition Risk Assessment:  Has the patient had any N/V/D within the last 2 months?  No  Does the patient have any non-healing wounds?  No  Has the patient had any unintentional weight loss or weight gain?  No   Diabetes:  Is the patient diabetic?  Yes  If diabetic, was a CBG obtained today?  No  Did the patient bring in their glucometer from home?  No  How often do you monitor your CBG's? daily.   Financial Strains and Diabetes Management:  Are you having any financial strains with the device, your supplies or your medication? No .  Does the patient want to be seen by Chronic Care Management for management of their diabetes?  No  Would the patient like to be referred to a Nutritionist or for Diabetic Management?  No   Diabetic Exams:  Diabetic Eye Exam: Completed 04/05/2020 Diabetic Foot Exam: Completed today   Interpreter Needed?: No  Information entered by :: NAllen LPN   Activities of Daily Living In your present state of health, do you have any difficulty performing the following activities: 04/12/2020 03/16/2020  Hearing? N N  Vision? Y N  Comment kind of blurry -  Difficulty concentrating or making decisions? Y N  Comment sometimes due to stroke -  Walking or climbing stairs? Y Y  Dressing or bathing? Y N  Doing errands, shopping? Y N  Preparing Food and eating ? Y -  Using the Toilet? Y -  In the past six months, have you accidently leaked urine? N -  Do you have problems with loss of bowel control? Y -  Comment rarely -  Managing your Medications? Y -  Managing your Finances? Y -   Housekeeping or managing your Housekeeping? Y -  Some recent data might be hidden    Patient Care Team: [name], MD as PCP - General (Internal Medicine) Sherren Mocha, MD as PCP - Cardiology (Cardiology) Estanislado Emms, MD as Consulting Physician (Nephrology) Marica Otter, [place] (Optometry) Hayden Pedro, MD as Consulting Physician (Ophthalmology) [name], MD (Internal Medicine) Cameron Sprang, MD as Consulting Physician (Neurology) Rex Kras, Claudette Stapler, RN as Melrose Humble, Tillie Rung as Social Worker  Indicate any recent [employer] you may have received from other than [facility] providers in the past year (date may be approximate).     Assessment:   This is a routine wellness examination for [place].  Hearing/Vision screen  Hearing Screening   125Hz  250Hz  500Hz  1000Hz  2000Hz  3000Hz  4000Hz  6000Hz  8000Hz   Right ear:           Left ear:           Vision Screening Comments: Regular Eye Exams, Dr. Sabra Heck, Dr. [name]  Dietary issues and exercise activities discussed: Current Exercise Habits: Home exercise routine, Time (Minutes): 30, Frequency (Times/Week): 4, Weekly Exercise (Minutes/Week): 120  Goals    . "to follow his renal diet and fluid restriction as recommended"     Wife stated:  [place] (see longitudinal plan of care for additional care plan information)  Current Barriers:  [name] Knowledge Deficit related to nutritional and fluid intake per ESRD diet recommendations  . Chronic Disease Management support and education needs related to DM  with ESRD, Multiple Myeloma  Nurse Case Manager Clinical Goal(s):  [name] Over the next 90 days, patient will verbalize basic understanding of End Stage Renal disease process and self health management plan as evidenced by patient will maintain ideal body weight without excess fluid greater than 6 lbs between HD treatments . Over the next 90 days, patient will verbalize basic understanding  of End Stage Renal disease process and self health management plan as evidenced by patient will maintain adequate nutritional intake per ESRD diet recommendations  CCM RN CM Interventions:  02/09/20 call completed with patient's [name]  . Inter-disciplinary care team collaboration (see longitudinal plan of care) . Evaluation of current treatment plan related to ESRD w/hemodialysis and patient's adherence to plan as established by provider . Determined patient attends the [facility] in [place] on Tuesday, Thursdays and Saturdays; he is followed by Dr. Jimmy Footman . Determined patient has an AVF for use of hemodialysis; Educated on how to check access for patency   . Provided education to patient re: importance of consulting with the dialysis clinic dietician for nutritional and fluid recommendations based on patient's monthly lab results . Discussed plans with patient for ongoing care management follow up and provided patient with direct contact information for care management team . Provided patient with printed educational materials related to Eating and Nutrition for Hemodialysis   Patient Self Care Activities:  . Self administers medications as prescribed . Attends all scheduled provider appointments . Calls pharmacy for medication refills . Calls provider office for new concerns or questions . Supportive wife to assist with care needs  Initial goal documentation     . "to keep his Cancer well managed"     Wife stated: [place] (see longitudinal plan of care for additional care plan information)  Current Barriers:  . Chronic Disease Management support and education needs related to DM with ESRD, Multiple Myeloma  Nurse Case Manager Clinical Goal(s):  [name] Over the next 90 days, patient will work with PCP care team and Oncology to address needs related to disease education and support for ongoing treatment of Multiple Myeloma   CCM RN CM Interventions:    02/09/20 Initial call completed with patient's [name] . Inter-disciplinary care team collaboration (see longitudinal plan of care) . Evaluation of current treatment plan related to Multiple Myeloma and patient's adherence to plan as established by provider . Determined patient is currently under the care of Dr. Alen Blew with [facility]. Medical Oncology with most recent f/u completed on 01/17/20;  o Impression and Plan:  o 1.  Multiple myeloma diagnosed in 2019 arising from MGUS.  He was found to have IgG kappa subtype.  o He continues to tolerate current therapy utilizing Velcade, dexamethasone and daratumumab.  Risks and benefits of continuing this treatment long-term were reviewed.  Potential complications that include infusion related reactions, peripheral neuropathy among others.  Switching to maintenance daratumumab only would be considered in the future.  Evaluation for high-dose chemotherapy and stem cell transplant currently on hold. o I recommended continuing the same regimen till the end of June and switch to maintenance daratumumab at least for the remainder of the year o 2. Anemia: Related to plasma cell disorder and renal insufficiency.  Hemoglobin is back to normal at this time. o 2.  Antiemetics: No nausea or vomiting reported.  Antiemetics are available to him. o 4.  Renal failure: Continues to be hemodialysis dependent o 5.  Weakness and debilitation: Slow improvement  noted at this time without any recent decline. o 6.  Goals of care: Therapy overall remains palliative and aggressive measures are warranted however.  His disease remains incurable but his performance status is adequate continue with the current regimen. o 7.  Hypotension: Resolved at this time with normal blood pressure today. o 8. Follow-up: He will remain on maintenance therapy with weekly Velcade, monthly daratumumab and MD follow-up in 4 weeks o 30 minutes were dedicated to this visit.  The time was  spent on updating his disease status, reviewing laboratory data, discussing treatment options and managing complications related to therapy . Discussed plans with patient for ongoing care management follow up and provided patient with direct contact information for care management team  Patient Self Care Activities:  . Self administers medications as prescribed . Attends all scheduled provider appointments . Calls pharmacy for medication refills . Calls provider office for new concerns or questions . Supportive wife to assist with care needs  Initial goal documentation     . Collaborate with RN Care Manager to perform apprpriate assessments to determine care management and care coordination needs     Current Barriers:  . Care management and care coordination needs related to DMII, ESRD, and multiple myeloma  which resulted in inpatient hospitalization . ADL IADL limitations  Clinical Social Work Clinical Goal(s):  [name] Over the next 60 days, patient will collaborate with care management team to assist with care coordination needs in response to recent inpatient hospitalization . New 01/16/20- Over the next 20 days the patient and his spouse will work with SW to identify cause of Medicaid denial and initiate an appeal Goal Met . New 01/25/20 Over the next 60 days the patient and his wife will work with SW to address ongoing care coordination needs related to caregiver assistance  CCM SW Interventions: Completed 04/05/20 with [facility] . Successful outbound call placed to the patients spouse to assess goal progression  . Informed by Mrs. Attia the patient was just seen by Dr. Coralyn Pear to address a bleed behind the patients right eye o Patient received an injections and prescription for eye drops o Instructed to return in 1 week for a re-check o Collaboration with RN Care Manager regarding recent update to patients health history . Determined Mrs. Stoffel was contacted by Mrs. Larence Penning with the in  home aide office requesting a new Medicaid application be completed o Mrs. Reppucci completed this application and submitted to Mrs. Larence Penning approximately 1 week ago . Discussed concern with staffing challenges which is delaying patients CAP assessment . Determined m[facility]. [facility] has yet to be told when she will need to return to the office setting for full-time hours but she is concerned she may not be able to continue remote work indefinitely to assist with patient care needs . Scheduled follow up call over the next month to assess goal progression o Encouraged Mrs. Lehrman to contact SW and or Consulting civil engineer as needed prior to next scheduled call  Completed 03/20/20 . Successful outbound call placed to [facility] in response to voice message received . Mrs. Marcell reports she is still awaiting assessment for CAP services . Mrs. Briner was contacted by Mrs. Larence Penning with the CAP office who stated they are short staffed but are hoping to be able to complete assessment in the coming weeks  Completed 02/29/20 with [facility] . Successful outbound call placed to the patients spouse to review goal progression . Determined Mrs. [facility] has been in  contact with Ilda Mori (contact made on 6/22)  who has assigned a nurse to perform an assessment to determine patient eligibility for CAP/DA services o Mrs. Larence Penning informed Mrs. Dettmann to expect a call over the next 10 days to schedule assessment . Encouraged Mrs. Linderman to contact SW with outcome of assessment and/or if further SW assistance is needed  . Scheduled follow up call over the next 6 weeks   Patient Self Care Activities:  . Attends all scheduled provider appointments . Calls pharmacy for medication refills . Calls provider office for new concerns or questions . Supportive wife and caregiver to assist with patient care needs   Please see past updates related to this goal by clicking on the "Past Updates" button in the selected goal     . Patient  Stated     No goals    . Patient Stated     04/12/2020, get stronger and not need walker any more      Depression Screen PHQ 2/9 Scores 04/12/2020 03/08/2020 09/22/2019 09/13/2019 03/17/2019 08/19/2018 10/09/2016  PHQ - 2 Score 0 0 0 0 0 0 0  PHQ- 9 Score - - - - 1 - -    Fall Risk Fall Risk  04/12/2020 03/08/2020 01/12/2020 11/01/2019 09/22/2019  Falls in the past year? 1 1 1  0 1  Comment lost balance - - - -  Number falls in past yr: 0 0 0 0 0  Comment - - - - -  Injury with Fall? 0 0 0 0 0  Risk for fall due to : Impaired balance/gait;Impaired mobility;Medication side effect;History of fall(s) - Impaired mobility - -  Follow up Falls evaluation completed;Education provided;Falls prevention discussed - - - -    Any stairs in or around the home? Yes  If so, are there any without handrails? Yes  Home free of loose throw rugs in walkways, pet beds, electrical cords, etc? Yes  Adequate lighting in your home to reduce risk of falls? Yes   ASSISTIVE DEVICES UTILIZED TO PREVENT FALLS:  Life alert? Yes  Use of a cane, walker or w/c? Yes  Grab bars in the bathroom? Yes  Shower chair or bench in shower? Yes  Elevated toilet seat or a handicapped toilet? Yes   TIMED UP AND GO:  Was the test performed? No .    Gait slow and steady with assistive device  Cognitive Function:     6CIT Screen 04/12/2020 03/17/2019  What Year? 0 points 0 points  What month? 3 points 0 points  What time? 0 points 0 points  Count back from 20 0 points 0 points  Months in reverse 4 points 4 points  Repeat phrase 6 points 2 points  Total Score 13 6    Immunizations Immunization History  Administered Date(s) Administered  . Hepatitis B, adult 11/16/2015, 12/24/2015, 01/21/2016, 05/23/2016, 10/21/2017  . Influenza Split 10/13/2015  . Influenza, Quadrivalent, Recombinant, Inj, Pf 07/15/2019  . Influenza,inj,Quad PF,6+ Mos 10/13/2015  . Influenza,inj,quad, With Preservative 06/07/2018  . Influenza-Unspecified  07/09/2014, 06/08/2018  . Moderna SARS-COVID-2 Vaccination 11/25/2019, 12/23/2019  . Pneumococcal Conjugate-13 12/14/2015    TDAP status: Due, Education has been provided regarding the importance of this vaccine. Advised may receive this vaccine at local pharmacy or Health Dept. Aware to provide a copy of the vaccination record if obtained from local pharmacy or Health Dept. Verbalized acceptance and understanding. Flu Vaccine status: Up to date Pneumococcal vaccine status:will get at dialysis Covid-19 vaccine status: Completed vaccines  Qualifies for Shingles Vaccine? Yes   Zostavax completed No   Shingrix Completed?: No.    Education has been provided regarding the importance of this vaccine. Patient has been advised to call insurance company to determine out of pocket expense if they have not yet received this vaccine. Advised may also receive vaccine at local pharmacy or Health Dept. Verbalized acceptance and understanding.  Screening Tests Health Maintenance  Topic Date Due  . Hepatitis C Screening  Never done  . FOOT EXAM  03/16/2020  . INFLUENZA VACCINE  04/08/2020  . PNEUMOCOCCAL POLYSACCHARIDE VACCINE AGE 11-64 HIGH RISK  04/12/2021 (Originally 11/22/1967)  . TETANUS/TDAP  04/12/2021 (Originally 11/23/1982)  . HEMOGLOBIN A1C  09/22/2020  . OPHTHALMOLOGY EXAM  04/12/2021  . COLONOSCOPY  01/23/2027  . COVID-19 Vaccine  Completed  . HIV Screening  Completed  . URINE MICROALBUMIN  Discontinued    Health Maintenance  Health Maintenance Due  Topic Date Due  . Hepatitis C Screening  Never done  . FOOT EXAM  03/16/2020  . INFLUENZA VACCINE  04/08/2020    Colorectal cancer screening: Completed 01/22/2017. Repeat every 10 years  Lung Cancer Screening: (Low Dose CT Chest recommended if Age 40-80 years, 30 pack-year currently smoking OR have quit w/in 15years.) does not qualify.   Lung Cancer Screening Referral: no  Additional Screening:  Hepatitis C Screening: does qualify;  Completed today  Vision Screening: Recommended annual ophthalmology exams for early detection of glaucoma and other disorders of the eye. Is the patient up to date with their annual eye exam?  Yes  Who is the provider or what is the name of the office in which the patient attends annual eye exams? Dr. Sabra Heck If pt is not established with a provider, would they like to be referred to a provider to establish care? No .   Dental Screening: Recommended annual dental exams for proper oral hygiene  Community Resource Referral / Chronic Care Management: CRR required this visit?  No   CCM required this visit?  No      Plan:     I have personally reviewed and noted the following in the patient's chart:   . Medical and social history . Use of alcohol, tobacco or illicit drugs  . Current medications and supplements . Functional ability and status . Nutritional status . Physical activity . Advanced directives . List of other physicians . Hospitalizations, surgeries, and ER visits in previous 12 months . Vitals . Screenings to include cognitive, depression, and falls . Referrals and appointments  In addition, I have reviewed and discussed with patient certain preventive protocols, quality metrics, and best practice recommendations. A written personalized care plan for preventive services as well as general preventive health recommendations were provided to patient.     Kellie Simmering, LPN   01/08/9766   Nurse Notes: Will get vaccines at dialysis.

## 2020-04-13 ENCOUNTER — Telehealth: Payer: Medicare Other

## 2020-04-13 ENCOUNTER — Ambulatory Visit (INDEPENDENT_AMBULATORY_CARE_PROVIDER_SITE_OTHER): Payer: Medicare Other

## 2020-04-13 DIAGNOSIS — C9000 Multiple myeloma not having achieved remission: Secondary | ICD-10-CM

## 2020-04-13 DIAGNOSIS — E1122 Type 2 diabetes mellitus with diabetic chronic kidney disease: Secondary | ICD-10-CM

## 2020-04-13 DIAGNOSIS — R2689 Other abnormalities of gait and mobility: Secondary | ICD-10-CM | POA: Diagnosis not present

## 2020-04-13 DIAGNOSIS — D631 Anemia in chronic kidney disease: Secondary | ICD-10-CM | POA: Diagnosis not present

## 2020-04-13 DIAGNOSIS — G40909 Epilepsy, unspecified, not intractable, without status epilepticus: Secondary | ICD-10-CM | POA: Diagnosis not present

## 2020-04-13 DIAGNOSIS — R269 Unspecified abnormalities of gait and mobility: Secondary | ICD-10-CM | POA: Diagnosis not present

## 2020-04-13 DIAGNOSIS — Z992 Dependence on renal dialysis: Secondary | ICD-10-CM

## 2020-04-13 DIAGNOSIS — F445 Conversion disorder with seizures or convulsions: Secondary | ICD-10-CM | POA: Diagnosis not present

## 2020-04-13 DIAGNOSIS — E1129 Type 2 diabetes mellitus with other diabetic kidney complication: Secondary | ICD-10-CM | POA: Diagnosis not present

## 2020-04-13 DIAGNOSIS — G8110 Spastic hemiplegia affecting unspecified side: Secondary | ICD-10-CM | POA: Diagnosis not present

## 2020-04-13 DIAGNOSIS — N186 End stage renal disease: Secondary | ICD-10-CM | POA: Diagnosis not present

## 2020-04-13 DIAGNOSIS — N2581 Secondary hyperparathyroidism of renal origin: Secondary | ICD-10-CM | POA: Diagnosis not present

## 2020-04-13 DIAGNOSIS — D509 Iron deficiency anemia, unspecified: Secondary | ICD-10-CM | POA: Diagnosis not present

## 2020-04-16 ENCOUNTER — Telehealth: Payer: Self-pay

## 2020-04-16 DIAGNOSIS — D631 Anemia in chronic kidney disease: Secondary | ICD-10-CM | POA: Diagnosis not present

## 2020-04-16 DIAGNOSIS — D509 Iron deficiency anemia, unspecified: Secondary | ICD-10-CM | POA: Diagnosis not present

## 2020-04-16 DIAGNOSIS — N2581 Secondary hyperparathyroidism of renal origin: Secondary | ICD-10-CM | POA: Diagnosis not present

## 2020-04-16 DIAGNOSIS — N186 End stage renal disease: Secondary | ICD-10-CM | POA: Diagnosis not present

## 2020-04-16 DIAGNOSIS — Z992 Dependence on renal dialysis: Secondary | ICD-10-CM | POA: Diagnosis not present

## 2020-04-16 DIAGNOSIS — E1129 Type 2 diabetes mellitus with other diabetic kidney complication: Secondary | ICD-10-CM | POA: Diagnosis not present

## 2020-04-16 MED FILL — BREO ELLIPTA 200-25 MCG INH: 200-25 | 30 days supply | Qty: 60 | Fill #2

## 2020-04-16 NOTE — Telephone Encounter (Signed)
[name] called and said that Dr. Baird Cancer wanted the date of Isaiah [name] next appt with Dr. Chalmers Cater and that it's December 21st with Dr. Chalmers Cater.

## 2020-04-17 ENCOUNTER — Other Ambulatory Visit: Payer: Self-pay

## 2020-04-17 ENCOUNTER — Ambulatory Visit: Payer: Medicare Other | Admitting: Physical Therapy

## 2020-04-17 ENCOUNTER — Encounter (INDEPENDENT_AMBULATORY_CARE_PROVIDER_SITE_OTHER): Payer: Medicare Other | Admitting: Ophthalmology

## 2020-04-17 DIAGNOSIS — R2689 Other abnormalities of gait and mobility: Secondary | ICD-10-CM | POA: Diagnosis not present

## 2020-04-17 DIAGNOSIS — R2681 Unsteadiness on feet: Secondary | ICD-10-CM | POA: Diagnosis not present

## 2020-04-17 DIAGNOSIS — M6281 Muscle weakness (generalized): Secondary | ICD-10-CM

## 2020-04-17 NOTE — Chronic Care Management (AMB) (Signed)
Chronic Care Management   Follow Up Note   04/13/2020 Name: Isaiah [name] [facility] MRN: [id number] DOB: August 08, 1966  Referred by: [name], MD Reason for referral : Chronic Care Management (FU RN CM Call )   Isaiah [name] is a 54 y.o. year old male who is a primary care patient of [name], MD. The CCM team was consulted for assistance with chronic disease management and care coordination needs.    Review of patient status, including review of consultants reports, relevant laboratory and other test results, and collaboration with appropriate care team members and the patient's provider was performed as part of comprehensive patient evaluation and provision of chronic care management services.    SDOH (Social Determinants of Health) assessments performed: Yes - no new acute challenges noted See Care Plan activities for detailed interventions related to [place])   Placed outbound CCM RN CM call to [name] for a care plan follow up call.     Outpatient Encounter Medications as of 04/13/2020  Medication Sig  . acetaminophen (TYLENOL) 325 MG tablet Take 1-2 tablets (325-650 mg total) by mouth every 4 (four) hours as needed for mild pain.  [name] albuterol (PROAIR HFA) 108 (90 Base) MCG/ACT inhaler Inhale two puffs every 4-6 hours if needed for cough or wheeze (Patient taking differently: Inhale 2 puffs into the lungs every 4 (four) hours as needed for wheezing or shortness of breath. Inhale two puffs every 4-6 hours if needed for cough or wheeze)  . Alcohol Swabs (SM ALCOHOL PREP) 70 % PADS   . atorvastatin (LIPITOR) 20 MG tablet TAKE 1 TABLET BY MOUTH ONCE A DAY  . AURYXIA 1 GM 210 MG(Fe) tablet Take 210 mg by mouth 3 (three) times daily.  [name] BREO ELLIPTA 200-25 MCG/INH AEPB INHALE 1 PUFF BY MOUTH AT THE SAME TIME EVERY DAY (Patient taking differently: Inhale 1 puff into the lungs daily. )  . brimonidine (ALPHAGAN) 0.2 % ophthalmic solution Place 1 drop into the right eye 2 (two) times daily.  (Patient not taking: Reported on 04/12/2020)  . camphor-menthol (SARNA) lotion Apply 1 application topically every 8 (eight) hours as needed for itching.  . Cetirizine HCl 10 MG CAPS Take 1 capsule by mouth daily.   . clonazePAM (KLONOPIN) 0.5 MG tablet Take 2 tablets every Monday, Wednesday, and Friday with hemodialysis (Patient taking differently: Take 1 mg by mouth every Monday, Wednesday, and Friday. Before dialysis)  . FREESTYLE LITE test strip   . hydrOXYzine (ATARAX/VISTARIL) 25 MG tablet Take 1 tablet (25 mg total) by mouth every 8 (eight) hours as needed for itching.  . Insulin Glargine (BASAGLAR KWIKPEN) 100 UNIT/ML Inject 10 Units into the skin daily.   . Insulin Lispro (HUMALOG KWIKPEN [place]) Inject 2-4 Units into the skin 3 (three) times daily before meals. Sliding scale: 150-250 =2 units, 250 -350=3 units, 350 += 4 units  . lacosamide (VIMPAT) 200 MG TABS tablet Take 1 tablet twice a day except on dialysis days M-W-F give 1 tab in AM, 1 tab after dialysis, 1 tab in PM. (Patient taking differently: Take 200 mg by mouth See admin instructions. Take 1 tablet twice a day except on dialysis days M-W-F give 1 tab in AM, 1 tab after dialysis, 1 tab in PM.)  . lamoTRIgine (LAMICTAL) 200 MG tablet Take 1 tablet (200 mg total) by mouth 2 (two) times daily.  . Lancets (FREESTYLE) lancets   . metoprolol tartrate (LOPRESSOR) 25 MG tablet Take 25 mg by mouth daily.  [name]  metroNIDAZOLE (FLAGYL) 250 MG tablet Take 1 tablet (250 mg total) by mouth every 8 (eight) hours. (Patient not taking: Reported on 03/29/2020)  . midodrine (PROAMATINE) 10 MG tablet TAKE 1 TABLET BY MOUTH TWICE DAILY WITH MEALS  . multivitamin (RENA-VIT) TABS tablet Take 1 tablet by mouth at bedtime.  . Nutritional Supplements (FEEDING SUPPLEMENT, NEPRO CARB STEADY,) LIQD Take 237 mLs by mouth 3 (three) times daily as needed (Supplement).  . pantoprazole (PROTONIX) 40 MG tablet TAKE 1 TABLET BY MOUTH ONCE DAILY (Patient taking differently:  Take 40 mg by mouth daily. )  . prednisoLONE acetate (PRED FORTE) 1 % ophthalmic suspension Place 1 drop into the right eye every hour while awake.  . prochlorperazine (COMPAZINE) 10 MG tablet TAKE 1 TABLET (10 MG TOTAL) BY MOUTH EVERY 6 (SIX) HOURS AS NEEDED FOR NAUSEA OR VOMITING.  [name] UNIFINE PENTIPS 31G X 8 MM MISC USE AS DIRECTED DAILY AFTER SUPPER   No facility-administered encounter medications on file as of 04/13/2020.     Objective:  Lab Results  Component Value Date   HGBA1C 5.0 03/22/2020   HGBA1C 5.5 03/16/2020   HGBA1C 5.1 12/21/2019   Lab Results  Component Value Date   LDLCALC 89 03/17/2019   CREATININE 8.12 (HH) 03/27/2020   BP Readings from Last 3 Encounters:  04/12/20 130/80  04/12/20 130/80  03/27/20 (!) 134/43    Goals Addressed    . "to follow his renal diet and fluid restriction as recommended"   On track    Wife stated:  [place] (see longitudinal plan of care for additional care plan information)  Current Barriers:  [name] Knowledge Deficit related to nutritional and fluid intake per ESRD diet recommendations  . Chronic Disease Management support and education needs related to DM with ESRD, Multiple Myeloma  Nurse Case Manager Clinical Goal(s):  [name] Over the next 90 days, patient will verbalize basic understanding of End Stage Renal disease process and self health management plan as evidenced by patient will maintain ideal body weight without excess fluid greater than 6 lbs between HD treatments . Over the next 90 days, patient will verbalize basic understanding of End Stage Renal disease process and self health management plan as evidenced by patient will maintain adequate nutritional intake per ESRD diet recommendations  CCM RN CM Interventions:  04/13/20 call completed with patient's [name]  . Inter-disciplinary care team collaboration (see longitudinal plan of care) . Evaluation of current treatment plan related to ESRD w/hemodialysis and  patient's adherence to plan as established by provider . Determined patient continues to attend the [facility] in [place] on Tuesday, Thursdays and Saturdays; he is followed by Dr. Jimmy Footman . Determined patient has an AVF for use of hemodialysis; Re-educated on how to check access for patency   . Determined patient's dialysis treatments have been uneventful, he is adhering to his renal diet and fluid restrictions as directed, wife denies questions at this time . Discussed plans with patient for ongoing care management follow up and provided patient with direct contact information for care management team . Confirmed wife/patient received the printed educational materials related to Eating and Nutrition for Hemodialysis, no questions noted today   Patient Self Care Activities:  . Self administers medications as prescribed . Attends all scheduled provider appointments . Calls pharmacy for medication refills . Calls provider office for new concerns or questions . Supportive wife to assist with care needs  Initial goal documentation and Please see past updates related to  this goal by clicking on the "Past Updates" button in the selected goal      . "to keep his Cancer well managed"   On track    Wife stated: [place] (see longitudinal plan of care for additional care plan information)  Current Barriers:  . Chronic Disease Management support and education needs related to DM with ESRD, Multiple Myeloma  Nurse Case Manager Clinical Goal(s):  [name] Over the next 90 days, patient will work with PCP care team and Oncology to address needs related to disease education and support for ongoing treatment of Multiple Myeloma   CCM RN CM Interventions:  04/13/20 Initial call completed with patient's [name] . Inter-disciplinary care team collaboration (see longitudinal plan of care) . Evaluation of current treatment plan related to Multiple Myeloma and patient's adherence to  plan as established by provider . Determined patient is currently under the care of Dr. Alen Blew with [facility]. Medical Oncology with most recent f/u completed on 03/27/20;  o Impression and Plan: o 54 year old man with: o 1.  Multiple myeloma diagnosed in 2019.  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 o He is status post therapy outlined above and currently on maintenance daratumumab without any major complications.  Risks and benefits of continuing this approach was discussed today.  Protein studies obtained on February 14, 2020 personally reviewed continues to show complete response without any detectable M spike.  His serum light chain also continues stable with declining ratio.  o Risks and benefits of continuing monthly daratumumab were reviewed and he is agreeable to continue at this time. o 2. Anemia: Related to his recent colitis and hematochezia.  Hemoglobin is improving.  No need for transfusion or growth factor support o 3.  Antiemetics: Antiemetics are available to him without any recent nausea or vomiting. o 4.  Renal failure: Currently on dialysis without any major complications. o 5. Goals of care: His disease is unlikely to be curable but aggressive measures are warranted at this time o 6. Follow-up: In 4 weeks for the next daratumumab treatment. o 30 minutes were dedicated to this visit.  The time was spent on reviewing his disease status, discussing treatment options, reviewing laboratory data and future plan of care review. . Determined wife and patient have a good understanding of the patient's current plan of care related to his Cancer treatment and when to call the doctor if needed  . Discussed plans with patient for ongoing care management follow up and provided patient with direct contact information for care management team  Patient Self Care Activities:  . Self administers medications as prescribed . Attends all scheduled provider appointments . Calls  pharmacy for medication refills . Calls provider office for new concerns or questions . Supportive wife to assist with care needs  Initial goal documentation and Please see past updates related to this goal by clicking on the "Past Updates" button in the selected goal        Plan:   Telephone follow up appointment with care management team member scheduled for: 05/25/20  Barb Merino, RN, BSN, CCM Care Management Coordinator [facility]  Direct Phone: [phone]

## 2020-04-17 NOTE — Therapy (Signed)
[facility] [address] [place], [place], [address] Phone: [phone]   Fax:  [phone]  Physical Therapy Treatment  Patient Details  Name: Isaiah [name] [facility] MRN: [id number] Date of Birth: 1966-08-18 Referring Provider (PT): Delice Lesch, MD   Encounter Date: 04/17/2020   PT End of Session - 04/17/20 1445    Visit Number 3    Number of Visits 17    Date for PT Re-Evaluation 34/91/79   90 day cert for 8-wk POC   Authorization Type Medicare    Progress Note Due on Visit 10    PT Start Time 1234    PT Stop Time 1315    PT Time Calculation (min) 41 min    Activity Tolerance Patient tolerated treatment well    Behavior During Therapy [facility] for tasks assessed/performed           Past Medical History:  Diagnosis Date  . A-fib ([place])   . Anemia   . Asthma   . DM type 2 (diabetes mellitus, type 2) ([place]) 06/09/2019  . ESRD (end stage renal disease) on dialysis ([place])   . Essential hypertension 06/09/2019  . GIB (gastrointestinal bleeding)    Recurrent episodes- 09/2014, 09/2015 and 05/2016  . Gout   . History of recent blood transfusion 10/27/14   2 Units PRBC's  . Hyperkalemia 07/2011  . Multiple myeloma ([place])   . OSA on CPAP   . Pulmonary embolism ([place]) 07/2011; 09/27/2014   a. Bilat PE 07/2011 - unclear cause, tx with 6 months Coumadin.;   . Seizure disorder ([place]) 06/09/2019  . Sepsis ([place])   . Sickle cell-thalassemia disease ([place])    a. Sickle cell trait.  . Sleep apnea   . Stroke ([place]) 09/2015   R-MCA, L-MCA, PCA and bilateral cerebellar complicated by DVT/PE  . Subdural hematoma ([place]) 05/2019    Past Surgical History:  Procedure Laterality Date  . AV FISTULA PLACEMENT Right 12/05/2015   Procedure: INSERTION OF ARTERIOVENOUS (AV) GORE-TEX GRAFT ARM;  Surgeon: Elam Dutch, MD;  Location: [facility];  Service: Vascular;  Laterality: Right;  . BONE MARROW BIOPSY    . CATARACT EXTRACTION  08/2019  . CATARACT  EXTRACTION  09/2019  . CHOLECYSTECTOMY    . CHOLECYSTECTOMY  1990's?  . COLONOSCOPY WITH PROPOFOL N/A 01/22/2017   Procedure: COLONOSCOPY WITH PROPOFOL;  Surgeon: Wilford Corner, MD;  Location: [facility];  Service: Endoscopy;  Laterality: N/A;  . EYE SURGERY Right   . INSERTION OF DIALYSIS CATHETER Right 10/16/2015   Procedure: INSERTION OF PALINDROME DIALYSIS CATHETER ;  Surgeon: Elam Dutch, MD;  Location: [facility];  Service: Vascular;  Laterality: Right;  . OTHER SURGICAL HISTORY     Retinal surgery  . PARS PLANA VITRECTOMY  02/17/2012   Procedure: PARS PLANA VITRECTOMY WITH 25 GAUGE;  Surgeon: Hayden Pedro, MD;  Location: [facility];  Service: Ophthalmology;  Laterality: Right;  . PERIPHERAL VASCULAR CATHETERIZATION N/A 03/20/2016   Procedure: A/V Shuntogram/Fistulagram;  Surgeon: Conrad Bullhead, MD;  Location: [facility];  Service: Cardiovascular;  Laterality: N/A;  . TEE WITHOUT CARDIOVERSION N/A 12/02/2016   Procedure: TRANSESOPHAGEAL ECHOCARDIOGRAM (TEE);  Surgeon: Larey Dresser, MD;  Location: [facility];  Service: Cardiovascular;  Laterality: N/A;    There were no vitals filed for this visit.   Subjective Assessment - 04/17/20 1236    Subjective No changes, nothing new.  No falls.  Really want to make sure to strengthen my  legs.    Pertinent History multiple myeloma, ESRD on dialysis, DM type II, DVT with IVC filter, CVA, COPD, a-fib; chemo treatments down to 1x/month (next is August 17th)    Patient Stated Goals To get back to walk without the walker, back to his baseline    Currently in Pain? No/denies               Reviewed Exercises from HEP given last visit  . Seated Hip Abduction with Resistance - 1 x daily - 5 x weekly - 1 sets - 10 reps (performed 2 sets of 10) . Seated Knee Extension with Resistance - 1 x daily - 5 x weekly - 1 sets - 10 reps (peformed 2 sets of 10) . Heel Toe Raises with Counter Support - 1 x daily - 5 x weekly - 1 sets - 10 reps  (performed 2 sets of 10, cues for increased toe raises) . Side Stepping with Counter Support - 1 x daily - 5 x weekly - 1 sets - 3 reps (performed 2 reps along counter, cues for upright posture) . Standing Single Leg Stance with Counter Support - 1 x daily - 5 x weekly - 1 sets - 3 reps - 15 hold (cues for increased hold time, upright posture)  Standing Tandem Balance with Counter Support - 1 x daily - 5 x weekly - 1 sets - 3 reps - 30 hold (cues for correct hold time, and for upright posture)               [facility] Adult PT Treatment/Exercise - 04/17/20 0001      Transfers   Transfers Sit to Stand;Stand to Sit    Sit to Stand 4: Min guard;With upper extremity assist;From chair/3-in-1;4: Min assist;From bed    Stand to Sit 4: Min guard;4: Min assist;With upper extremity assist;To bed;To chair/3-in-1    Number of Reps 10 reps;1 set   from mat surface   Transfer Cueing Cues for upright posture upon standing-one episode of posterior lean upon standing.    Comments Additional sit<>stand from chair, 3 reps throughout session      Ambulation/Gait   Ambulation/Gait Yes    Ambulation/Gait Assistance 4: Min guard    Ambulation Distance (Feet) 60 Feet   30 x 2, then 60, 100 ft   Assistive device Rolling walker    Gait Pattern Step-through pattern;Ataxic;Trunk flexed;Poor foot clearance - left;Poor foot clearance - right    Ambulation Surface Level;Indoor    Gait Comments Cues for upright posture, cues for R foot clearance and step length.  Pt does veer to L several times.      Exercises   Exercises Knee/Hip      Knee/Hip Exercises: Aerobic   Nustep Level 2, lower extrremities, x 5 minutes, pt has difficulty maintaining L foot position.  Performed for flexibility and lower extremity strength.      Knee/Hip Exercises: Standing   Hip Abduction Stengthening;Right;Left;2 sets;10 reps;Knee straight   step taps to side   Hip Extension Stengthening;Right;Left;2 sets;10 reps;Knee straight   step  taps   Functional Squat 2 sets;10 reps                    PT Short Term Goals - 03/29/20 1344      PT SHORT TERM GOAL #1   Title Pt will perform HEP independenlty for improved strength, balance and gait.  TARGET 04/27/2020    Time 4    Period [place]  Status New      PT SHORT TERM GOAL #2   Title Pt will improve Berg Score to at least 23/56 for decreased fall risk.    Baseline 13/56 03/29/2020    Time 4    Period Weeks    Status New      PT SHORT TERM GOAL #3   Title Pt will improve TUG score to less than or equal to 40 seconds for decreased fall risk.    Baseline 52.25 sec 03/29/2020    Time 4    Period Weeks    Status New      PT SHORT TERM GOAL #4   Title Pt will perform at least 3 minutes of standing with UE support and supervision for imrpoved standing tolerance for participation in ADLs.    Baseline 54 sec, 36 sec min guard/close supervision    Time 4    Period Weeks    Status New      PT SHORT TERM GOAL #5   Title Pt will improve gait velocity to at least 1.3 ft/sec for improved gait efficiency and safety.    Time 4    Period Weeks    Status New      Additional Short Term Goals   Additional Short Term Goals Yes      PT SHORT TERM GOAL #6   Title Pt/wife will verbalize understanding of fall prevention in home environment.    Time 4    Period Weeks    Status New             PT Long Term Goals - 03/29/20 1348      PT LONG TERM GOAL #1   Title Pt will be independent with progression of HEP for improved strength, blaance, gait.  TARGET 05/25/2020    Time 8    Period Weeks    Status New      PT LONG TERM GOAL #2   Title Pt will imrpove Berg Balance score to at least 33/56 for decreased fall risk.    Time 8    Period Weeks    Status New      PT LONG TERM GOAL #3   Title Pt will improve TUG score to less than or equal to 30 seconds for decreased fall risk.    Time 8    Period Weeks    Status New      PT LONG TERM GOAL #4   Title Pt will  improve gait velocity to at least 1.8 ft/sec for improved gait velocity and decreased fall risk.    Time 8    Period Weeks    Status New      PT LONG TERM GOAL #5   Title Pt will ascend/descent 12 steps with both handrails with supervision only, to negotiate steps at home.    Time 8    Period Weeks    Status New                 Plan - 04/17/20 1447    Clinical Impression Statement Reviewed HEP this visit, with pt return demo understanding, needing cues for upright posture throughout performance of HEP.  Performed additional standing exercises, sit<>stand, and use of aerobic machine (NuStep) for lower extremity strengthening.  Pt tolerates well and is motivated for therapy, though he does have decreased postural awareness throughout exercises.  He will continue to benefit from skilled PT to further address balance and strength and gait.    Personal  Factors and Comorbidities Comorbidity 3+    Comorbidities PMH:  multiple myeloma, ESRD on dialysis, DM type II, DVT with IVC filter, CVA, COPD, a-fib    Examination-Activity Limitations Locomotion Level;Transfers;Stairs;Stand    Examination-Participation Restrictions Community Activity    Stability/Clinical Decision Making Evolving/Moderate complexity    Rehab Potential Good    PT Frequency 2x / week    PT Duration 8 weeks   plus eval   PT Treatment/Interventions ADLs/Self Care Home Management;DME Instruction;Neuromuscular re-education;Balance training;Therapeutic exercise;Therapeutic activities;Functional mobility training;Stair training;Gait training;Patient/family education    PT Next Visit Plan Continue strengthening and balance-upgrade HEP as able; gait training, postural strengthening and education    PT Home Exercise Plan Access Code [email or web address]    Consulted and Agree with Plan of Care Patient           Patient will benefit from skilled therapeutic intervention in order to improve the following deficits and impairments:  Abnormal  gait, Difficulty walking, Decreased safety awareness, Decreased balance, Decreased mobility, Decreased strength  Visit Diagnosis: Muscle weakness (generalized)  Other abnormalities of gait and mobility  Unsteadiness on feet     Problem List Patient Active Problem List   Diagnosis Date Noted  . Colitis 03/15/2020  . AMS (altered mental status) 10/05/2019  . Acute metabolic encephalopathy 76/54/6503  . Headache, unspecified 09/28/2019  . Abnormality of gait 09/13/2019  . Diabetes mellitus with end-stage renal disease ([place]) 08/07/2019  . Benign hypertensive kidney disease with chronic kidney disease 08/07/2019  . Hypothyroidism, unspecified 07/15/2019  . Spastic hemiparesis ([place]) 07/12/2019  . Dysphagia, oropharyngeal phase 07/05/2019  . Allergy, unspecified, initial encounter 06/27/2019  . PAF (paroxysmal atrial fibrillation) ([place])   . Bacteremia   . Labile blood pressure   . Labile blood glucose   . Controlled type 2 diabetes mellitus with hyperglycemia, without long-term current use of insulin ([place])   . ESRD on dialysis ([place])   . Right sided weakness   . SDH (subdural hematoma) ([place])   . Chronic intracranial subdural hematoma ([facility]) 06/09/2019  . Multiple myeloma not having achieved remission ([place]) 06/09/2019  . ESRD on hemodialysis ([place]) 06/09/2019  . Essential hypertension 06/09/2019  . DM type 2 (diabetes mellitus, type 2) ([place]) 06/09/2019  . Seizure disorder ([place]) 06/09/2019  . Aspiration pneumonia ([place]) 06/09/2019  . History of bacteremia 06/09/2019  . Atrial fibrillation, chronic ([place]) 06/09/2019  . Bilateral kidney stones 05/31/2019  . Proteus infection 05/28/2019  . History of restrictive pulmonary disease 04/07/2019  . Multiple myeloma ([place]) 05/17/2018  . Goals of care, counseling/discussion 05/17/2018  . Partial idiopathic epilepsy with seizures of localized onset, intractable, without status epilepticus ([place]) 11/02/2017  . Iron deficiency anemia, unspecified  01/22/2017  . COPD (chronic obstructive pulmonary disease) ([place]) 11/11/2016  . CVA (cerebral vascular accident) ([place]) 11/11/2016  . GERD (gastroesophageal reflux disease) 11/11/2016  . GI bleed 11/11/2016  . 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  . Obesity (BMI 30-39.9) 11/11/2016  . Pulmonary hypertension ([place]) 11/11/2016  . Patent foramen ovale 11/11/2016  . Hyperparathyroidism, secondary renal ([place]) 11/11/2016  . VTE (venous thromboembolism) 11/11/2016  . Hypotension 11/11/2016  . Systemic hypertension 11/11/2016  . Seizure ([place]) 11/11/2016  . Other seizures ([place]) 07/25/2016  . Encounter for removal of sutures 05/27/2016  . Ingrown nail 03/03/2016  . Onychomycosis due to dermatophyte 03/03/2016  . Other acquired hammer toe 03/03/2016  . Hemiparesis affecting right side as late effect of cerebrovascular accident ([place]) 02/28/2016  . Aphasia complicating stroke 54/65/6812  . Seizures ([place])  01/08/2016  . History of ischemic left MCA stroke 01/08/2016  . Right sided weakness 01/08/2016  . Atrial fibrillation ([place]) 01/08/2016  . Leukocytosis 01/08/2016  . Benign essential HTN 01/08/2016  . Anemia of chronic disease 01/08/2016  . Controlled type 2 diabetes mellitus with diabetic nephropathy, without long-term current use of insulin ([place]) 01/08/2016  . History of DVT (deep vein thrombosis) 01/08/2016  . 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 Hypocalcemia 12/26/2015  . Long term (current) use of anticoagulants 11/07/2015  . Aftercare including intermittent dialysis ([place]) 11/02/2015  . Other specified coagulation defects ([place]) 11/02/2015  . Complication of vascular dialysis catheter 11/02/2015  . Diarrhea, unspecified 11/02/2015  . Fever, unspecified 11/02/2015  . Pruritus, unspecified 11/02/2015  . Shortness of breath 11/02/2015  . Type 2 diabetes mellitus with diabetic peripheral angiopathy without gangrene ([place]) 11/02/2015  . ESRD (end stage renal disease) on dialysis  [facility])     Demeka Sutter W. 04/17/2020, 2:50 PM  Frazier Butt., PT   [facility] [address] [place]  [place], [place], [address] Phone: [phone]   Fax:  [phone]  Name: Isaiah [name] [facility] MRN: [id number] Date of Birth: Jun 27, 1966

## 2020-04-17 NOTE — Patient Instructions (Addendum)
Visit Information  Goals Addressed    . "to follow his renal diet and fluid restriction as recommended"   On track    Wife stated:  Isaiah [name] (see longitudinal plan of care for additional care plan information)  Current Barriers:  [name] Knowledge Deficit related to nutritional and fluid intake per ESRD diet recommendations  . Chronic Disease Management support and education needs related to DM with ESRD, Multiple Myeloma  Nurse Case Manager Clinical Goal(s):  [name] Over the next 90 days, patient will verbalize basic understanding of End Stage Renal disease process and self health management plan as evidenced by patient will maintain ideal body weight without excess fluid greater than 6 lbs between HD treatments . Over the next 90 days, patient will verbalize basic understanding of End Stage Renal disease process and self health management plan as evidenced by patient will maintain adequate nutritional intake per ESRD diet recommendations  CCM RN CM Interventions:  04/13/20 call completed with patient's wife Isaiah [name]  . Inter-disciplinary care team collaboration (see longitudinal plan of care) . Evaluation of current treatment plan related to ESRD w/hemodialysis and patient's adherence to plan as established by provider . Determined patient continues to attend the [facility] in [name] on Tuesday, Thursdays and Saturdays; he is followed by Dr. Jimmy [name] . Determined patient has an AVF for use of hemodialysis; Re-educated on how to check access for patency   . Determined patient's dialysis treatments have been uneventful, he is adhering to his renal diet and fluid restrictions as directed, wife denies questions at this time . Discussed plans with patient for ongoing care management follow up and provided patient with direct contact information for care management team . Confirmed wife/patient received the printed educational materials related to Eating and Nutrition for  Hemodialysis, no questions noted today   Patient Self Care Activities:  . Self administers medications as prescribed . Attends all scheduled provider appointments . Calls pharmacy for medication refills . Calls provider office for new concerns or questions . Supportive wife to assist with care needs  Initial goal documentation and Please see past updates related to this goal by clicking on the "Past Updates" button in the selected goal      . "to keep his Cancer well managed"   On track    Wife stated: Isaiah [name] (see longitudinal plan of care for additional care plan information)  Current Barriers:  . Chronic Disease Management support and education needs related to DM with ESRD, Multiple Myeloma  Nurse Case Manager Clinical Goal(s):  [name] Over the next 90 days, patient will work with PCP care team and Oncology to address needs related to disease education and support for ongoing treatment of Multiple Myeloma   CCM RN CM Interventions:  04/13/20 Initial call completed with patient's wife Isaiah [name] . Inter-disciplinary care team collaboration (see longitudinal plan of care) . Evaluation of current treatment plan related to Multiple Myeloma and patient's adherence to plan as established by provider . Determined patient is currently under the care of Dr. Alen [name] with [place] [name]. Medical Oncology with most recent f/u completed on 03/27/20;  o Impression and Plan: o 54 year old man with: o 1.  Multiple myeloma diagnosed in 2019.  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 o He is status post therapy outlined above and currently on maintenance daratumumab without any major complications.  Risks and benefits of continuing this approach was discussed today.  Protein studies obtained on February 14, 2020 personally reviewed continues to show complete response without any detectable M spike.  His serum light chain also continues stable with declining ratio.  o Risks and benefits of  continuing monthly daratumumab were reviewed and he is agreeable to continue at this time. o 2. Anemia: Related to his recent colitis and hematochezia.  Hemoglobin is improving.  No need for transfusion or growth factor support o 3.  Antiemetics: Antiemetics are available to him without any recent nausea or vomiting. o 4.  Renal failure: Currently on dialysis without any major complications. o 5. Goals of care: His disease is unlikely to be curable but aggressive measures are warranted at this time o 6. Follow-up: In 4 weeks for the next daratumumab treatment. o 30 minutes were dedicated to this visit.  The time was spent on reviewing his disease status, discussing treatment options, reviewing laboratory data and future plan of care review. . Determined wife and patient have a good understanding of the patient's current plan of care related to his Cancer treatment and when to call the doctor if needed  . Discussed plans with patient for ongoing care management follow up and provided patient with direct contact information for care management team  Patient Self Care Activities:  . Self administers medications as prescribed . Attends all scheduled provider appointments . Calls pharmacy for medication refills . Calls provider office for new concerns or questions . Supportive wife to assist with care needs  Initial goal documentation and Please see past updates related to this goal by clicking on the "Past Updates" button in the selected goal         Patient verbalizes understanding of instructions provided today.   Telephone follow up appointment with care management team member scheduled for: 05/25/20  Barb Merino, RN, BSN, CCM Care Management Coordinator [facility]  Direct Phone: [phone]

## 2020-04-18 DIAGNOSIS — N186 End stage renal disease: Secondary | ICD-10-CM | POA: Diagnosis not present

## 2020-04-18 DIAGNOSIS — D631 Anemia in chronic kidney disease: Secondary | ICD-10-CM | POA: Diagnosis not present

## 2020-04-18 DIAGNOSIS — D509 Iron deficiency anemia, unspecified: Secondary | ICD-10-CM | POA: Diagnosis not present

## 2020-04-18 DIAGNOSIS — E1129 Type 2 diabetes mellitus with other diabetic kidney complication: Secondary | ICD-10-CM | POA: Diagnosis not present

## 2020-04-18 DIAGNOSIS — N2581 Secondary hyperparathyroidism of renal origin: Secondary | ICD-10-CM | POA: Diagnosis not present

## 2020-04-18 DIAGNOSIS — Z992 Dependence on renal dialysis: Secondary | ICD-10-CM | POA: Diagnosis not present

## 2020-04-18 NOTE — Progress Notes (Signed)
[facility] Note  04/19/2020     CHIEF COMPLAINT Patient presents for Retina Follow Up   HISTORY OF PRESENT ILLNESS: Isaiah [name] is a 54 y.o. male who presents to the clinic today for:   HPI    Retina Follow Up    Diagnosis: Vit heme.  In right eye.  Severity is moderate.  Duration of 1 week.  I, the attending physician,  performed the HPI with the patient and updated documentation appropriately.          Comments    Patient states seeing spots in vision OD. C/o headaches for the past couple of weeks following dialysis treatments. Patient on dialysis on Monday, Wednesday, and Friday. BS is 140 this am. Last a1c was 5.0 on 07.15.21.        Last edited by [name], MD on 04/19/2020  8:17 AM. (History)    Pt states he is doing well, his wife states he has been having headaches, especially after dialysis on Mondays, pt states his floaters are getting better  Referring physician: [place], [place], [address] [place],  [place] [id number]  HISTORICAL INFORMATION:   Selected notes from the [place] [facility] of Dr. [name].  Urgent re-referral from Dr. [name] for possible PDR w/NVI OD.  Presented to [facility] on 7.27.21 w/elevated IOPS and OD swelling and irritation.  Started on Brimonidine BID OD and Pred Q1hr OD.    CURRENT MEDICATIONS: Current Outpatient Medications (Ophthalmic Drugs)  Medication Sig  . prednisoLONE acetate (PRED FORTE) 1 % ophthalmic suspension Place 1 drop into the right eye every hour while awake.  . brimonidine (ALPHAGAN) 0.2 % ophthalmic solution Place 1 drop into the right eye 2 (two) times daily. (Patient not taking: Reported on 04/12/2020)   No current facility-administered medications for this visit. (Ophthalmic Drugs)   Current Outpatient Medications (Other)  Medication Sig  . acetaminophen (TYLENOL) 325 MG tablet Take 1-2 tablets (325-650 mg total) by mouth every 4 (four) hours as needed for  mild pain.  [name] albuterol (PROAIR HFA) 108 (90 Base) MCG/ACT inhaler Inhale two puffs every 4-6 hours if needed for cough or wheeze (Patient taking differently: Inhale 2 puffs into the lungs every 4 (four) hours as needed for wheezing or shortness of breath. Inhale two puffs every 4-6 hours if needed for cough or wheeze)  . Alcohol Swabs (SM ALCOHOL PREP) 70 % PADS   . atorvastatin (LIPITOR) 20 MG tablet TAKE 1 TABLET BY MOUTH ONCE A DAY  . AURYXIA 1 GM 210 MG(Fe) tablet Take 210 mg by mouth 3 (three) times daily.  [name] BREO ELLIPTA 200-25 MCG/INH AEPB INHALE 1 PUFF BY MOUTH AT THE SAME TIME EVERY DAY (Patient taking differently: Inhale 1 puff into the lungs daily. )  . camphor-menthol (SARNA) lotion Apply 1 application topically every 8 (eight) hours as needed for itching.  . Cetirizine HCl 10 MG CAPS Take 1 capsule by mouth daily.   . clonazePAM (KLONOPIN) 0.5 MG tablet Take 2 tablets every Monday, Wednesday, and Friday with hemodialysis (Patient taking differently: Take 1 mg by mouth every Monday, Wednesday, and Friday. Before dialysis)  . FREESTYLE LITE test strip   . hydrOXYzine (ATARAX/VISTARIL) 25 MG tablet Take 1 tablet (25 mg total) by mouth every 8 (eight) hours as needed for itching.  . Insulin Glargine (BASAGLAR KWIKPEN) 100 UNIT/ML Inject 10 Units into the skin daily.   . Insulin Lispro (HUMALOG KWIKPEN [place]) Inject 2-4  Units into the skin 3 (three) times daily before meals. Sliding scale: 150-250 =2 units, 250 -350=3 units, 350 += 4 units  . lamoTRIgine (LAMICTAL) 200 MG tablet Take 1 tablet (200 mg total) by mouth 2 (two) times daily.  . Lancets (FREESTYLE) lancets   . metoprolol tartrate (LOPRESSOR) 25 MG tablet Take 25 mg by mouth daily.  . midodrine (PROAMATINE) 10 MG tablet TAKE 1 TABLET BY MOUTH TWICE DAILY WITH MEALS  . multivitamin (RENA-VIT) TABS tablet Take 1 tablet by mouth at bedtime.  . Nutritional Supplements (FEEDING SUPPLEMENT, NEPRO CARB STEADY,) LIQD Take 237 mLs by mouth 3  (three) times daily as needed (Supplement).  . pantoprazole (PROTONIX) 40 MG tablet TAKE 1 TABLET BY MOUTH ONCE DAILY (Patient taking differently: Take 40 mg by mouth daily. )  . prochlorperazine (COMPAZINE) 10 MG tablet TAKE 1 TABLET (10 MG TOTAL) BY MOUTH EVERY 6 (SIX) HOURS AS NEEDED FOR NAUSEA OR VOMITING.  [name] UNIFINE PENTIPS 31G X 8 MM MISC USE AS DIRECTED DAILY AFTER SUPPER  . lacosamide (VIMPAT) 200 MG TABS tablet Take 1 tablet twice a day except on dialysis days M-W-F give 1 tab in AM, 1 tab after dialysis, 1 tab in PM. (Patient not taking: Reported on 04/19/2020)  . metroNIDAZOLE (FLAGYL) 250 MG tablet Take 1 tablet (250 mg total) by mouth every 8 (eight) hours. (Patient not taking: Reported on 03/29/2020)   No current facility-administered medications for this visit. (Other)      REVIEW OF SYSTEMS: ROS    Positive for: Neurological, Genitourinary, Endocrine, Eyes   Negative for: Constitutional, Gastrointestinal, Skin, Musculoskeletal, HENT, Cardiovascular, Respiratory, Psychiatric, Allergic/Imm, Heme/Lymph   Last edited by Roselee Nova D, COT on 04/19/2020  7:58 AM. (History)       ALLERGIES Allergies  Allergen Reactions  . Codeine Swelling  . Codeine Rash and Other (See Comments)    Unknown reaction (patient says it was more serious than just a rash, but he can't remember what happened)     PAST MEDICAL HISTORY Past Medical History:  Diagnosis Date  . A-fib ([place])   . Anemia   . Asthma   . DM type 2 (diabetes mellitus, type 2) ([place]) 06/09/2019  . ESRD (end stage renal disease) on dialysis ([place])   . Essential hypertension 06/09/2019  . GIB (gastrointestinal bleeding)    Recurrent episodes- 09/2014, 09/2015 and 05/2016  . Gout   . History of recent blood transfusion 10/27/14   2 Units PRBC's  . Hyperkalemia 07/2011  . Multiple myeloma ([place])   . OSA on CPAP   . Pulmonary embolism ([place]) 07/2011; 09/27/2014   a. Bilat PE 07/2011 - unclear cause, tx with 6 months  Coumadin.;   . Seizure disorder ([place]) 06/09/2019  . Sepsis ([place])   . Sickle cell-thalassemia disease ([place])    a. Sickle cell trait.  . Sleep apnea   . Stroke ([place]) 09/2015   R-MCA, L-MCA, PCA and bilateral cerebellar complicated by DVT/PE  . Subdural hematoma ([place]) 05/2019   Past Surgical History:  Procedure Laterality Date  . AV FISTULA PLACEMENT Right 12/05/2015   Procedure: INSERTION OF ARTERIOVENOUS (AV) GORE-TEX GRAFT ARM;  Surgeon: Elam Dutch, MD;  Location: [facility];  Service: Vascular;  Laterality: Right;  . BONE MARROW BIOPSY    . CATARACT EXTRACTION  08/2019  . CATARACT EXTRACTION  09/2019  . CHOLECYSTECTOMY    . CHOLECYSTECTOMY  1990's?  . COLONOSCOPY WITH PROPOFOL N/A 01/22/2017   Procedure: COLONOSCOPY WITH PROPOFOL;  Surgeon: Wilford Corner, MD;  Location: [facility];  Service: Endoscopy;  Laterality: N/A;  . EYE SURGERY Right   . INSERTION OF DIALYSIS CATHETER Right 10/16/2015   Procedure: INSERTION OF PALINDROME DIALYSIS CATHETER ;  Surgeon: Elam Dutch, MD;  Location: [facility];  Service: Vascular;  Laterality: Right;  . OTHER SURGICAL HISTORY     Retinal surgery  . PARS PLANA VITRECTOMY  02/17/2012   Procedure: PARS PLANA VITRECTOMY WITH 25 GAUGE;  Surgeon: Hayden Pedro, MD;  Location: [facility];  Service: Ophthalmology;  Laterality: Right;  . PERIPHERAL VASCULAR CATHETERIZATION N/A 03/20/2016   Procedure: A/V Shuntogram/Fistulagram;  Surgeon: Conrad Cibola, MD;  Location: [facility];  Service: Cardiovascular;  Laterality: N/A;  . TEE WITHOUT CARDIOVERSION N/A 12/02/2016   Procedure: TRANSESOPHAGEAL ECHOCARDIOGRAM (TEE);  Surgeon: Larey Dresser, MD;  Location: [facility];  Service: Cardiovascular;  Laterality: N/A;    FAMILY HISTORY Family History  Problem Relation Age of Onset  . Hypertension Mother   . Hypertension Father   . Stroke Father   . Sickle cell anemia Brother   . Diabetes Mother   . Sickle cell anemia Brother   . Diabetes type I  Brother   . Kidney disease Brother     SOCIAL HISTORY Social History   Tobacco Use  . Smoking status: Never Smoker  . Smokeless tobacco: Never Used  Vaping Use  . Vaping Use: Never used  Substance Use Topics  . Alcohol use: Not Currently  . Drug use: Never         OPHTHALMIC EXAM:  Base Eye Exam    Visual Acuity (Snellen - Linear)      Right Left   Dist cc 20/100 -3 20/100 -2   Dist ph cc NI NI       Tonometry (Tonopen, 8:14 AM)      Right Left   Pressure 10 09       Pupils      Dark Light Shape React APD   Right 5 5 Irregular NR None   Left 3 2 Round Minimal None       Visual Fields (Counting fingers)      Left Right    Full    Restrictions  Partial outer superior temporal, inferior nasal deficiencies       Extraocular Movement      Right Left    Full, Ortho Full, Ortho       Neuro/Psych    Oriented x3: Yes   Mood/Affect: Normal       Dilation    Both eyes: 1.0% Mydriacyl, 2.5% Phenylephrine @ 8:14 AM        Slit Lamp and Fundus Exam    Slit Lamp Exam      Right Left   Lids/Lashes Dermato Dermato   Conjunctiva/Sclera Melanosis Melanosis   Cornea Arcus well healed cataract wound, 2-3+ PEE 3+ PEE, mild cataract wounds   Anterior Chamber Deep and quiet Deep.  0.5+ pigment   Iris Round and poorly dilated to 4.80m, No NVI Round and dilated   Lens PC IOL in good position PCIOL   Vitreous Post vitrectomy.  Residual anterior vitreous syneresis.  +RBC - improved Mild asteroid hyalosis, syneresis       Fundus Exam      Right Left   Disc 2+pallor. Sharp rim. Hazy view.  Perfused.   C/D Ratio 0.7 0.5   Macula Hazy view improving.  Blunted foveal reflex. Hazy view.  Grossly  flat.   Vessels Attenuated, tortuous, severe midzonal attenuation and ischemia Attenuated, tortuous   Periphery Attached, Peripheral laser scars, room for fill in, Hazy view. Scattered [place] Attached.  360 PRP        Refraction    Wearing Rx      Sphere Cylinder Axis Add    Right -1.75 +0.50 090 +2.50   Left -1.50 Sphere  +2.50          IMAGING AND PROCEDURES  Imaging and Procedures for 04/19/2020  OCT, Retina - OU - Both Eyes       Right Eye Quality was good. Central Foveal Thickness: 258. Progression has improved. Findings include normal foveal contour, no IRF, no SRF, outer retinal atrophy, vitreomacular adhesion  (interval improvement in vitreous opacities; diffuse retinal atrophy).   Left Eye Quality was good. Central Foveal Thickness: 248. Progression has been stable. Findings include normal foveal contour, no SRF (Diffuse retinal atrophy/thinning; stable retinoschisis temporal midzone caught on widefield).   Notes *Images captured and stored on drive  Diagnosis / Impression:  OD: interval improvement in vitreous opacities; diffuse retinal atrophy OS: Diffuse retinal atrophy/thinning; retinoschisis temporal midzone caught on widefield -- stable  Clinical management:  See below  Abbreviations: NFP - Normal foveal profile. CME - cystoid macular edema. PED - pigment epithelial detachment. IRF - intraretinal fluid. SRF - subretinal fluid. EZ - ellipsoid zone. ERM - epiretinal membrane. ORA - outer retinal atrophy. ORT - outer retinal tubulation. SRHM - subretinal hyper-reflective material. IRHM - intraretinal hyper-reflective material       Fluorescein Angiography Optos (Transit OD)       Right Eye   Progression has no prior data. Early phase findings include delayed filling, vascular perfusion defect, staining. Mid/Late phase findings include vascular perfusion defect, leakage, staining (No NV).   Left Eye   Progression has no prior data. Early phase findings include staining, blockage, vascular perfusion defect. Mid/Late phase findings include blockage, staining, vascular perfusion defect (No NV).   Notes **Images stored on drive**  Impression: OD: severe retinal ischemia 360; capillary non-perfusion extending posteriorly to just  outside arcades; no NV OS: scattered vascular non-perfusion, no NV                  ASSESSMENT/PLAN:    ICD-10-CM   1. Vitreous hemorrhage, right eye (HCC)  H43.11   2. Hyphema of right eye  H21.01   3. Proliferative diabetic retinopathy of both eyes without macular edema associated with type 2 diabetes mellitus ([place])  U63.3354   4. Retinal edema  H35.81 OCT, Retina - OU - Both Eyes  5. Sickle cell retinopathy without crisis ([place])  D57.1    H36   6. Essential hypertension  I10   7. Hypertensive retinopathy of both eyes  H35.033 Fluorescein Angiography Optos (Transit OD)  8. Pseudophakia of both eyes  Z96.1   9. Bilateral dry eyes  H04.123    1,2. VH & microyphema OD secondary to PDR  - s/p IVA OD #1 (07.29.21)  - VH improving and microhyphema essentially resolved OD  - NVI OD regressed      - On PF q1hr OD -- decrease to 4x a day      - cont Cosopt BID OD      - cont cyclopentalate BID OD  - VH precautions reviewed -- minimize activities, keep head elevated, avoid ASA/NSAIDs/blood thinners as able  - F/u in 1 wk w/DFE/OCT/possible PRP OD  3,4. Proliferative diabetic retinopathy w/ retinal ischemia,  no DME, OU (OD > OS)  - pt of Dr. [name], last visit 3.4.21  - history of PPV w/ endolaser, FAX OD 6.11.13; PRP OS 2020  - s/p IVA OD #1(7.29.21) as above for new VH and microhyphema  - exam shows NVI regressed and VH improving OD; OS with good 360 PRP and +asteroid hyalosis  - OCT without diabetic macular edema, but diffuse atrophy, both eyes   - FA today 8.12.21 shows severe vascular perfusion OD -- peripheral nonperfusion extends posteriorly just outside arcades -- will need fill in PRP; no NV OU  - discussed findings and prognosis  - F/u in 1 wk w/DFE/OCT/possible PRP OD  5. History of Sickle Cell Retinopathy  - 360 PRP in place OU  - no active NV, confirmed on FA 8.12.21  6,7. Hypertensive retinopathy OU - discussed importance of tight BP control - monitor  8.  Pseudophakia OU  - s/p CE/IOL OU (OD Jan 2021; OS Feb 2021  - IOL in good position, doing well  - monitor  9. Dry eyes OU - recommend artificial tears and lubricating ointment as needed  Ophthalmic Meds Ordered this visit:  No orders of the defined types were placed in this encounter.      Return in about 1 week (around 04/26/2020) for f/u 1 week, PDR/VH OD, DFE, OCT.  There are no Patient Instructions on file for this visit.   Explained the diagnoses, plan, and follow up with the patient and they expressed understanding.  Patient expressed understanding of the importance of proper follow up care.  This document serves as a record of services personally performed by Gardiner Sleeper, MD, PhD. It was created on their behalf by Leonie Douglas, an ophthalmic technician. The creation of this record is the provider's dictation and/or activities during the visit.    Electronically signed by: Leonie Douglas COA, 04/19/20  12:27 PM   This document serves as a record of services personally performed by Gardiner Sleeper, MD, PhD. It was created on their behalf by San Jetty. Owens Shark, OA an ophthalmic technician. The creation of this record is the provider's dictation and/or activities during the visit.    Electronically signed by: San Jetty. Marguerita Merles 08.12.2021 12:27 PM   Gardiner Sleeper, M.D., Ph.D. Diseases & Surgery of the Retina and Vitreous [facility]  I have reviewed the above documentation for accuracy and completeness, and I agree with the above. Gardiner Sleeper, M.D., Ph.D. 04/19/20 12:27 PM   Abbreviations: M myopia (nearsighted); A astigmatism; H hyperopia (farsighted); P presbyopia; Mrx spectacle prescription;  CTL contact lenses; OD right eye; OS left eye; OU both eyes  XT exotropia; ET esotropia; PEK punctate epithelial keratitis; PEE punctate epithelial erosions; DES dry eye syndrome; MGD meibomian gland dysfunction; ATs artificial tears; PFAT's preservative  free artificial tears; [place] nuclear sclerotic cataract; PSC posterior subcapsular cataract; ERM epi-retinal membrane; PVD posterior vitreous detachment; RD retinal detachment; DM diabetes mellitus; DR diabetic retinopathy; NPDR non-proliferative diabetic retinopathy; PDR proliferative diabetic retinopathy; CSME clinically significant macular edema; DME diabetic macular edema; dbh dot blot hemorrhages; CWS cotton wool spot; POAG primary open angle glaucoma; C/D cup-to-disc ratio; HVF humphrey visual field; GVF goldmann visual field; OCT optical coherence tomography; IOP intraocular pressure; BRVO Branch retinal vein occlusion; CRVO central retinal vein occlusion; CRAO central retinal artery occlusion; BRAO branch retinal artery occlusion; RT retinal tear; SB scleral buckle; PPV pars plana vitrectomy; VH Vitreous hemorrhage; PRP panretinal laser photocoagulation; IVK intravitreal  kenalog; VMT vitreomacular traction; MH Macular hole;  NVD neovascularization of the disc; NVE neovascularization elsewhere; AREDS age related eye disease study; ARMD age related macular degeneration; POAG primary open angle glaucoma; EBMD epithelial/anterior basement membrane dystrophy; ACIOL anterior chamber intraocular lens; IOL intraocular lens; PCIOL posterior chamber intraocular lens; Phaco/IOL phacoemulsification with intraocular lens placement; [place] photorefractive keratectomy; LASIK laser assisted in situ keratomileusis; HTN hypertension; DM diabetes mellitus; COPD chronic obstructive pulmonary disease

## 2020-04-19 ENCOUNTER — Encounter (INDEPENDENT_AMBULATORY_CARE_PROVIDER_SITE_OTHER): Payer: Self-pay | Admitting: Ophthalmology

## 2020-04-19 ENCOUNTER — Ambulatory Visit: Payer: Medicare Other | Admitting: Physical Therapy

## 2020-04-19 ENCOUNTER — Other Ambulatory Visit: Payer: Self-pay

## 2020-04-19 ENCOUNTER — Ambulatory Visit (INDEPENDENT_AMBULATORY_CARE_PROVIDER_SITE_OTHER): Payer: Medicare Other | Admitting: Ophthalmology

## 2020-04-19 DIAGNOSIS — Z961 Presence of intraocular lens: Secondary | ICD-10-CM | POA: Diagnosis not present

## 2020-04-19 DIAGNOSIS — H2101 Hyphema, right eye: Secondary | ICD-10-CM | POA: Diagnosis not present

## 2020-04-19 DIAGNOSIS — D571 Sickle-cell disease without crisis: Secondary | ICD-10-CM | POA: Diagnosis not present

## 2020-04-19 DIAGNOSIS — M6281 Muscle weakness (generalized): Secondary | ICD-10-CM

## 2020-04-19 DIAGNOSIS — H3581 Retinal edema: Secondary | ICD-10-CM | POA: Diagnosis not present

## 2020-04-19 DIAGNOSIS — R2681 Unsteadiness on feet: Secondary | ICD-10-CM

## 2020-04-19 DIAGNOSIS — H35033 Hypertensive retinopathy, bilateral: Secondary | ICD-10-CM

## 2020-04-19 DIAGNOSIS — E113593 Type 2 diabetes mellitus with proliferative diabetic retinopathy without macular edema, bilateral: Secondary | ICD-10-CM

## 2020-04-19 DIAGNOSIS — H4311 Vitreous hemorrhage, right eye: Secondary | ICD-10-CM

## 2020-04-19 DIAGNOSIS — H3689 Sickle-cell disease without crisis: Secondary | ICD-10-CM

## 2020-04-19 DIAGNOSIS — H36 Retinal disorders in diseases classified elsewhere: Secondary | ICD-10-CM | POA: Diagnosis not present

## 2020-04-19 DIAGNOSIS — H04123 Dry eye syndrome of bilateral lacrimal glands: Secondary | ICD-10-CM | POA: Diagnosis not present

## 2020-04-19 DIAGNOSIS — R2689 Other abnormalities of gait and mobility: Secondary | ICD-10-CM | POA: Diagnosis not present

## 2020-04-19 DIAGNOSIS — I10 Essential (primary) hypertension: Secondary | ICD-10-CM

## 2020-04-19 NOTE — Therapy (Signed)
[facility] [address] [place], [place], [address] Phone: [phone]   Fax:  [phone]  Physical Therapy Treatment  Patient Details  Name: Isaiah [name] MRN: [id number] Date of Birth: 1966/03/12 Referring Provider (PT): Delice Lesch, MD   Encounter Date: 04/19/2020   PT End of Session - 04/19/20 1705    Visit Number 4    Number of Visits 17    Date for PT Re-Evaluation 85/46/27   90 day cert for 8-wk POC   Authorization Type Medicare    Progress Note Due on Visit 10    PT Start Time 0350    PT Stop Time 1535    PT Time Calculation (min) 42 min    Equipment Utilized During Treatment Gait belt    Activity Tolerance Patient tolerated treatment well    Behavior During Therapy WFL for tasks assessed/performed           Past Medical History:  Diagnosis Date  . A-fib ([place])   . Anemia   . Asthma   . DM type 2 (diabetes mellitus, type 2) ([place]) 06/09/2019  . ESRD (end stage renal disease) on dialysis ([place])   . Essential hypertension 06/09/2019  . GIB (gastrointestinal bleeding)    Recurrent episodes- 09/2014, 09/2015 and 05/2016  . Gout   . History of recent blood transfusion 10/27/14   2 Units PRBC's  . Hyperkalemia 07/2011  . Multiple myeloma ([place])   . OSA on CPAP   . Pulmonary embolism ([place]) 07/2011; 09/27/2014   a. Bilat PE 07/2011 - unclear cause, tx with 6 months Coumadin.;   . Seizure disorder ([place]) 06/09/2019  . Sepsis ([place])   . Sickle cell-thalassemia disease ([place])    a. Sickle cell trait.  . Sleep apnea   . Stroke ([place]) 09/2015   R-MCA, L-MCA, PCA and bilateral cerebellar complicated by DVT/PE  . Subdural hematoma ([place]) 05/2019    Past Surgical History:  Procedure Laterality Date  . AV FISTULA PLACEMENT Right 12/05/2015   Procedure: INSERTION OF ARTERIOVENOUS (AV) GORE-TEX GRAFT ARM;  Surgeon: Elam Dutch, MD;  Location: [facility];  Service: Vascular;  Laterality: Right;  . BONE MARROW BIOPSY     . CATARACT EXTRACTION  08/2019  . CATARACT EXTRACTION  09/2019  . CHOLECYSTECTOMY    . CHOLECYSTECTOMY  1990's?  . COLONOSCOPY WITH PROPOFOL N/A 01/22/2017   Procedure: COLONOSCOPY WITH PROPOFOL;  Surgeon: Wilford Corner, MD;  Location: [facility];  Service: Endoscopy;  Laterality: N/A;  . EYE SURGERY Right   . INSERTION OF DIALYSIS CATHETER Right 10/16/2015   Procedure: INSERTION OF PALINDROME DIALYSIS CATHETER ;  Surgeon: Elam Dutch, MD;  Location: [facility];  Service: Vascular;  Laterality: Right;  . OTHER SURGICAL HISTORY     Retinal surgery  . PARS PLANA VITRECTOMY  02/17/2012   Procedure: PARS PLANA VITRECTOMY WITH 25 GAUGE;  Surgeon: Hayden Pedro, MD;  Location: [facility];  Service: Ophthalmology;  Laterality: Right;  . PERIPHERAL VASCULAR CATHETERIZATION N/A 03/20/2016   Procedure: A/V Shuntogram/Fistulagram;  Surgeon: Conrad [place], MD;  Location: [facility];  Service: Cardiovascular;  Laterality: N/A;  . TEE WITHOUT CARDIOVERSION N/A 12/02/2016   Procedure: TRANSESOPHAGEAL ECHOCARDIOGRAM (TEE);  Surgeon: Larey Dresser, MD;  Location: [facility];  Service: Cardiovascular;  Laterality: N/A;    There were no vitals filed for this visit.   Subjective Assessment - 04/19/20 1457    Subjective No changes, nothing new.  Ready to  work.    Pertinent History multiple myeloma, ESRD on dialysis, DM type II, DVT with IVC filter, CVA, COPD, a-fib; chemo treatments down to 1x/month (next is August 17th)    Patient Stated Goals To get back to walk without the walker, back to his baseline    Currently in Pain? No/denies                             [name] Adult PT Treatment/Exercise - 04/19/20 0001      Transfers   Transfers Sit to Stand;Stand to Sit    Sit to Stand 4: Min guard;With upper extremity assist;From chair/3-in-1;From bed    Stand to Sit 4: Min guard;4: Min assist;With upper extremity assist;To bed;To chair/3-in-1;Uncontrolled descent   improves with  cues for hand placement to control descent   Stand to Sit Details (indicate cue type and reason) Tactile cues for sequencing;Verbal cues for technique    Number of Reps 10 reps;1 set   from mat surface   Transfer Cueing Cues for upright posture, cues for slowed descent into sitting    Comments Additional sit<>stand from chair, 3 reps through session      Ambulation/Gait   Ambulation/Gait Yes    Ambulation/Gait Assistance 4: Min guard;5: Supervision    Ambulation/Gait Assistance Details PT provides manual cues for lateral weightshifting through hips for improved foot clearance and step length.    Ambulation Distance (Feet) 125 Feet   80 ft, 60 ft   Assistive device Rolling walker    Gait Pattern Step-through pattern;Ataxic;Trunk flexed;Poor foot clearance - left;Poor foot clearance - right    Ambulation Surface Level;Indoor    Gait Comments Cues provided for decreased UE reliance, decreased forward flexed posture on RW.  With cues, pt demonstrates improved postural awareness with decreased UE reliance.       Exercises   Exercises Knee/Hip      Knee/Hip Exercises: Aerobic   Stepper Seated SciFit Stepper, Level 1.5, 4 extremities x 5 minutes.  For last minute, pt able to use BLEs only.  He is able to improve from 8 RPM to >50 RPM for duration of activity, for improved lower extremity strength.      Knee/Hip Exercises: Standing   Functional Squat 10 reps;2 sets          Seated postural exercises:  Anterior/posterior pelvic tilt x 10 reps with tactile cues      Balance Exercises - 04/19/20 0001      Balance Exercises: Standing   SLS with Vectors Solid surface;Upper extremity assist 2;Other reps (comment);Other (comment)   Alternating step taps to 4" cabinet shelf x 20 reps   Other Standing Exercises Wide BOS weigthshifting x 10, then with added reach, 5 reps each side.  Stagger stance rocking forward/back x 10 reps with UE support             PT Education - 04/19/20 1703     Education Details Cues for upright posture, decreased forward lean on RW for decreased UE reliance on RW, to allow for improved step length and foot clearance.    Person(s) Educated Patient    Methods Explanation;Demonstration;Verbal cues    Comprehension Verbalized understanding;Returned demonstration;Verbal cues required            PT Short Term Goals - 03/29/20 1344      PT SHORT TERM GOAL #1   Title Pt will perform HEP independenlty for improved strength, balance and gait.  TARGET 04/27/2020    Time 4    Period Weeks    Status New      PT SHORT TERM GOAL #2   Title Pt will improve Berg Score to at least 23/56 for decreased fall risk.    Baseline 13/56 03/29/2020    Time 4    Period Weeks    Status New      PT SHORT TERM GOAL #3   Title Pt will improve TUG score to less than or equal to 40 seconds for decreased fall risk.    Baseline 52.25 sec 03/29/2020    Time 4    Period Weeks    Status New      PT SHORT TERM GOAL #4   Title Pt will perform at least 3 minutes of standing with UE support and supervision for imrpoved standing tolerance for participation in ADLs.    Baseline 54 sec, 36 sec min guard/close supervision    Time 4    Period Weeks    Status New      PT SHORT TERM GOAL #5   Title Pt will improve gait velocity to at least 1.3 ft/sec for improved gait efficiency and safety.    Time 4    Period Weeks    Status New      Additional Short Term Goals   Additional Short Term Goals Yes      PT SHORT TERM GOAL #6   Title Pt/wife will verbalize understanding of fall prevention in home environment.    Time 4    Period Weeks    Status New             PT Long Term Goals - 03/29/20 1348      PT LONG TERM GOAL #1   Title Pt will be independent with progression of HEP for improved strength, blaance, gait.  TARGET 05/25/2020    Time 8    Period Weeks    Status New      PT LONG TERM GOAL #2   Title Pt will imrpove Berg Balance score to at least 33/56 for  decreased fall risk.    Time 8    Period Weeks    Status New      PT LONG TERM GOAL #3   Title Pt will improve TUG score to less than or equal to 30 seconds for decreased fall risk.    Time 8    Period Weeks    Status New      PT LONG TERM GOAL #4   Title Pt will improve gait velocity to at least 1.8 ft/sec for improved gait velocity and decreased fall risk.    Time 8    Period Weeks    Status New      PT LONG TERM GOAL #5   Title Pt will ascend/descent 12 steps with both handrails with supervision only, to negotiate steps at home.    Time 8    Period Weeks    Status New                 Plan - 04/19/20 1706    Clinical Impression Statement Skilled PT session today focused on transfers/functional strengthening, standing balance exercises and gait training with RW.  Pt able to demonstrate improved posture and decreased heavy reliance through BUEs at [place] with cues and practice in session today.    Personal Factors and Comorbidities Comorbidity 3+    Comorbidities PMH:  multiple myeloma, ESRD on dialysis,  DM type II, DVT with IVC filter, CVA, COPD, a-fib    Examination-Activity Limitations Locomotion Level;Transfers;Stairs;Stand    Examination-Participation Restrictions Community Activity    Stability/Clinical Decision Making Evolving/Moderate complexity    Rehab Potential Good    PT Frequency 2x / week    PT Duration 8 weeks   plus eval   PT Treatment/Interventions ADLs/Self Care Home Management;DME Instruction;Neuromuscular re-education;Balance training;Therapeutic exercise;Therapeutic activities;Functional mobility training;Stair training;Gait training;Patient/family education    PT Next Visit Plan Consider trying RW wheels on the outside for better stability of RW; postural strengthening, BLE strengthening, balance and gait training; check about OT eval    PT Home Exercise Plan Access Code [email or web address]    Consulted and Agree with Plan of Care Patient           Patient  will benefit from skilled therapeutic intervention in order to improve the following deficits and impairments:  Abnormal gait, Difficulty walking, Decreased safety awareness, Decreased balance, Decreased mobility, Decreased strength  Visit Diagnosis: Other abnormalities of gait and mobility  Muscle weakness (generalized)  Unsteadiness on feet     Problem List Patient Active Problem List   Diagnosis Date Noted  . Colitis 03/15/2020  . AMS (altered mental status) 10/05/2019  . Acute metabolic encephalopathy 91/69/4503  . Headache, unspecified 09/28/2019  . Abnormality of gait 09/13/2019  . Diabetes mellitus with end-stage renal disease ([place]) 08/07/2019  . Benign hypertensive kidney disease with chronic kidney disease 08/07/2019  . Hypothyroidism, unspecified 07/15/2019  . Spastic hemiparesis ([place]) 07/12/2019  . Dysphagia, oropharyngeal phase 07/05/2019  . Allergy, unspecified, initial encounter 06/27/2019  . PAF (paroxysmal atrial fibrillation) ([place])   . Bacteremia   . Labile blood pressure   . Labile blood glucose   . Controlled type 2 diabetes mellitus with hyperglycemia, without long-term current use of insulin ([place])   . ESRD on dialysis ([place])   . Right sided weakness   . SDH (subdural hematoma) ([place])   . Chronic intracranial subdural hematoma ([facility]) 06/09/2019  . Multiple myeloma not having achieved remission ([place]) 06/09/2019  . ESRD on hemodialysis ([place]) 06/09/2019  . Essential hypertension 06/09/2019  . DM type 2 (diabetes mellitus, type 2) ([place]) 06/09/2019  . Seizure disorder ([place]) 06/09/2019  . Aspiration pneumonia ([place]) 06/09/2019  . History of bacteremia 06/09/2019  . Atrial fibrillation, chronic ([place]) 06/09/2019  . Bilateral kidney stones 05/31/2019  . Proteus infection 05/28/2019  . History of restrictive pulmonary disease 04/07/2019  . Multiple myeloma ([place]) 05/17/2018  . Goals of care, counseling/discussion 05/17/2018  . Partial idiopathic epilepsy with  seizures of localized onset, intractable, without status epilepticus ([place]) 11/02/2017  . Iron deficiency anemia, unspecified 01/22/2017  . COPD (chronic obstructive pulmonary disease) ([place]) 11/11/2016  . CVA (cerebral vascular accident) ([place]) 11/11/2016  . GERD (gastroesophageal reflux disease) 11/11/2016  . GI bleed 11/11/2016  . 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  . Obesity (BMI 30-39.9) 11/11/2016  . Pulmonary hypertension ([place]) 11/11/2016  . Patent foramen ovale 11/11/2016  . Hyperparathyroidism, secondary renal ([place]) 11/11/2016  . VTE (venous thromboembolism) 11/11/2016  . Hypotension 11/11/2016  . Systemic hypertension 11/11/2016  . Seizure ([place]) 11/11/2016  . Other seizures ([place]) 07/25/2016  . Encounter for removal of sutures 05/27/2016  . Ingrown nail 03/03/2016  . Onychomycosis due to dermatophyte 03/03/2016  . Other acquired hammer toe 03/03/2016  . Hemiparesis affecting right side as late effect of cerebrovascular accident ([place]) 02/28/2016  . Aphasia complicating stroke 88/82/8003  . Seizures ([place]) 01/08/2016  . History of  ischemic left MCA stroke 01/08/2016  . Right sided weakness 01/08/2016  . Atrial fibrillation ([place]) 01/08/2016  . Leukocytosis 01/08/2016  . Benign essential HTN 01/08/2016  . Anemia of chronic disease 01/08/2016  . Controlled type 2 diabetes mellitus with diabetic nephropathy, without long-term current use of insulin ([place]) 01/08/2016  . History of DVT (deep vein thrombosis) 01/08/2016  . 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 Hypocalcemia 12/26/2015  . Long term (current) use of anticoagulants 11/07/2015  . Aftercare including intermittent dialysis ([place]) 11/02/2015  . Other specified coagulation defects ([place]) 11/02/2015  . Complication of vascular dialysis catheter 11/02/2015  . Diarrhea, unspecified 11/02/2015  . Fever, unspecified 11/02/2015  . Pruritus, unspecified 11/02/2015  . Shortness of breath 11/02/2015  . Type 2 diabetes  mellitus with diabetic peripheral angiopathy without gangrene ([place]) 11/02/2015  . ESRD (end stage renal disease) on dialysis [name])     Isaiah Convey W. 04/19/2020, 5:09 PM  Frazier Butt., PT   [facility] [address] [place], [place], [address] Phone: [phone]   Fax:  [phone]  Name: Isaiah [name] MRN: [id number] Date of Birth: September 26, 1965

## 2020-04-20 DIAGNOSIS — N2581 Secondary hyperparathyroidism of renal origin: Secondary | ICD-10-CM | POA: Diagnosis not present

## 2020-04-20 DIAGNOSIS — D631 Anemia in chronic kidney disease: Secondary | ICD-10-CM | POA: Diagnosis not present

## 2020-04-20 DIAGNOSIS — N186 End stage renal disease: Secondary | ICD-10-CM | POA: Diagnosis not present

## 2020-04-20 DIAGNOSIS — D509 Iron deficiency anemia, unspecified: Secondary | ICD-10-CM | POA: Diagnosis not present

## 2020-04-20 DIAGNOSIS — Z992 Dependence on renal dialysis: Secondary | ICD-10-CM | POA: Diagnosis not present

## 2020-04-20 DIAGNOSIS — E1129 Type 2 diabetes mellitus with other diabetic kidney complication: Secondary | ICD-10-CM | POA: Diagnosis not present

## 2020-04-23 DIAGNOSIS — Z992 Dependence on renal dialysis: Secondary | ICD-10-CM | POA: Diagnosis not present

## 2020-04-23 DIAGNOSIS — N2581 Secondary hyperparathyroidism of renal origin: Secondary | ICD-10-CM | POA: Diagnosis not present

## 2020-04-23 DIAGNOSIS — N186 End stage renal disease: Secondary | ICD-10-CM | POA: Diagnosis not present

## 2020-04-23 DIAGNOSIS — D509 Iron deficiency anemia, unspecified: Secondary | ICD-10-CM | POA: Diagnosis not present

## 2020-04-23 DIAGNOSIS — D631 Anemia in chronic kidney disease: Secondary | ICD-10-CM | POA: Diagnosis not present

## 2020-04-23 DIAGNOSIS — E1129 Type 2 diabetes mellitus with other diabetic kidney complication: Secondary | ICD-10-CM | POA: Diagnosis not present

## 2020-04-23 MED FILL — AURYXIA 210 MG TABLET: 1 GM | 30 days supply | Qty: 90 | Fill #4

## 2020-04-23 MED FILL — clonazePAM 0.5 MG TABS: 0.5 | 35 days supply | Qty: 30 | Fill #3

## 2020-04-24 ENCOUNTER — Other Ambulatory Visit: Payer: Self-pay | Admitting: Internal Medicine

## 2020-04-24 ENCOUNTER — Inpatient Hospital Stay: Payer: Medicare Other

## 2020-04-24 ENCOUNTER — Inpatient Hospital Stay: Payer: Medicare Other | Attending: Oncology

## 2020-04-24 ENCOUNTER — Encounter: Payer: Self-pay | Admitting: Oncology

## 2020-04-24 ENCOUNTER — Other Ambulatory Visit: Payer: Self-pay

## 2020-04-24 ENCOUNTER — Telehealth: Payer: Self-pay

## 2020-04-24 ENCOUNTER — Inpatient Hospital Stay (HOSPITAL_BASED_OUTPATIENT_CLINIC_OR_DEPARTMENT_OTHER): Payer: Medicare Other | Admitting: Oncology

## 2020-04-24 VITALS — BP 150/40 | HR 81 | Temp 98.1°F | Resp 20 | Ht 70.0 in | Wt 188.1 lb

## 2020-04-24 DIAGNOSIS — C9000 Multiple myeloma not having achieved remission: Secondary | ICD-10-CM

## 2020-04-24 DIAGNOSIS — H211X1 Other vascular disorders of iris and ciliary body, right eye: Secondary | ICD-10-CM | POA: Diagnosis not present

## 2020-04-24 DIAGNOSIS — Z5112 Encounter for antineoplastic immunotherapy: Secondary | ICD-10-CM | POA: Diagnosis not present

## 2020-04-24 DIAGNOSIS — Z992 Dependence on renal dialysis: Secondary | ICD-10-CM | POA: Diagnosis not present

## 2020-04-24 DIAGNOSIS — N186 End stage renal disease: Secondary | ICD-10-CM | POA: Diagnosis not present

## 2020-04-24 DIAGNOSIS — H209 Unspecified iridocyclitis: Secondary | ICD-10-CM | POA: Diagnosis not present

## 2020-04-24 DIAGNOSIS — T82858A Stenosis of vascular prosthetic devices, implants and grafts, initial encounter: Secondary | ICD-10-CM | POA: Diagnosis not present

## 2020-04-24 DIAGNOSIS — I871 Compression of vein: Secondary | ICD-10-CM | POA: Diagnosis not present

## 2020-04-24 LAB — CMP (CANCER CENTER ONLY)
ALT: 16 U/L (ref 0–44)
AST: 15 U/L (ref 15–41)
Albumin: 4.3 g/dL (ref 3.5–5.0)
Alkaline Phosphatase: 176 U/L — ABNORMAL HIGH (ref 38–126)
Anion gap: 14 (ref 5–15)
BUN: 36 mg/dL — ABNORMAL HIGH (ref 6–20)
CO2: 32 mmol/L (ref 22–32)
Calcium: 10.5 mg/dL — ABNORMAL HIGH (ref 8.9–10.3)
Chloride: 96 mmol/L — ABNORMAL LOW (ref 98–111)
Creatinine: 8.65 mg/dL (ref 0.61–1.24)
GFR, Est AFR Am: 7 mL/min — ABNORMAL LOW (ref >60)
GFR, Estimated: 6 mL/min — ABNORMAL LOW (ref >60)
Glucose, Bld: 196 mg/dL — ABNORMAL HIGH (ref 70–99)
Potassium: 3.9 mmol/L (ref 3.5–5.1)
Sodium: 142 mmol/L (ref 135–145)
Total Bilirubin: 0.8 mg/dL (ref 0.3–1.2)
Total Protein: 7.3 g/dL (ref 6.5–8.1)

## 2020-04-24 LAB — CBC WITH DIFFERENTIAL (CANCER CENTER ONLY)
Abs Immature Granulocytes: 0.02 10*3/uL (ref 0.00–0.07)
Basophils Absolute: 0.1 10*3/uL (ref 0.0–0.1)
Basophils Relative: 1 %
Eosinophils Absolute: 0.8 10*3/uL — ABNORMAL HIGH (ref 0.0–0.5)
Eosinophils Relative: 12 %
HCT: 35.6 % — ABNORMAL LOW (ref 39.0–52.0)
Hemoglobin: 13.2 g/dL (ref 13.0–17.0)
Immature Granulocytes: 0 %
Lymphocytes Relative: 8 %
Lymphs Abs: 0.6 10*3/uL — ABNORMAL LOW (ref 0.7–4.0)
MCH: 33.7 pg (ref 26.0–34.0)
MCHC: 37.1 g/dL — ABNORMAL HIGH (ref 30.0–36.0)
MCV: 90.8 fL (ref 80.0–100.0)
Monocytes Absolute: 0.9 10*3/uL (ref 0.1–1.0)
Monocytes Relative: 12 %
Neutro Abs: 4.6 10*3/uL (ref 1.7–7.7)
Neutrophils Relative %: 67 %
Platelet Count: 218 10*3/uL (ref 150–400)
RBC: 3.92 MIL/uL — ABNORMAL LOW (ref 4.22–5.81)
RDW: 14.6 % (ref 11.5–15.5)
WBC Count: 6.9 10*3/uL (ref 4.0–10.5)
nRBC: 3.9 % — ABNORMAL HIGH (ref 0.0–0.2)

## 2020-04-24 MED ORDER — ACETAMINOPHEN 325 MG PO TABS
650.0000 mg | ORAL_TABLET | Freq: Once | ORAL | Status: AC
  Administered 2020-04-24: 650 mg via ORAL

## 2020-04-24 MED ORDER — DARATUMUMAB-HYALURONIDASE-FIHJ 1800-30000 MG-UT/15ML ~~LOC~~ SOLN
1800.0000 mg | Freq: Once | SUBCUTANEOUS | Status: AC
  Administered 2020-04-24: 1800 mg via SUBCUTANEOUS
  Filled 2020-04-24: qty 15

## 2020-04-24 MED ORDER — DIPHENHYDRAMINE HCL 25 MG PO CAPS
50.0000 mg | ORAL_CAPSULE | Freq: Once | ORAL | Status: AC
  Administered 2020-04-24: 50 mg via ORAL

## 2020-04-24 MED ORDER — ACETAMINOPHEN 325 MG PO TABS
ORAL_TABLET | ORAL | Status: AC
  Filled 2020-04-24: qty 2

## 2020-04-24 MED ORDER — DIPHENHYDRAMINE HCL 25 MG PO CAPS
ORAL_CAPSULE | ORAL | Status: AC
  Filled 2020-04-24: qty 2

## 2020-04-24 MED FILL — PREDNISOLONE AC 1% EYE DROP: 1 | 50 days supply | Qty: 5 | Fill #0

## 2020-04-24 NOTE — Telephone Encounter (Signed)
Critical Value: Creatinine 8.65 Dr. Alen Blew notified.

## 2020-04-24 NOTE — Patient Instructions (Signed)
Isaiah [name] Discharge Instructions for Patients Receiving Chemotherapy  Today you received the following chemotherapy agents: darzalex  To help prevent nausea and vomiting after your treatment, we encourage you to take your nausea medication as directed    If you develop nausea and vomiting that is not controlled by your nausea medication, call the clinic.   BELOW ARE SYMPTOMS THAT SHOULD BE REPORTED IMMEDIATELY:  *FEVER GREATER THAN 100.5 F  *CHILLS WITH OR WITHOUT FEVER  NAUSEA AND VOMITING THAT IS NOT CONTROLLED WITH YOUR NAUSEA MEDICATION  *UNUSUAL SHORTNESS OF BREATH  *UNUSUAL BRUISING OR BLEEDING  TENDERNESS IN MOUTH AND THROAT WITH OR WITHOUT PRESENCE OF ULCERS  *URINARY PROBLEMS  *BOWEL PROBLEMS  UNUSUAL RASH Items with * indicate a potential emergency and should be followed up as soon as possible.  Feel free to call the clinic should you have any questions or concerns. The clinic phone number is [phone].  Please show the Council at check-in to the Emergency Department and triage nurse.

## 2020-04-24 NOTE — Progress Notes (Signed)
Hematology and Oncology Follow Up Visit  [place] [id number] [birth year] 54 y.o. 04/24/2020 11:40 AM    Principle Diagnosis: 54 year old man with IgG kappa multiple myeloma diagnosed in 2019.    Past therapy: Velcade dexamethasone and cyclophosphamide weekly started on 05/25/2018.  Velcade is given at 1.5 mg/m weekly, dexamethasone is 20 mg weekly and and Cytoxan is given at 650 mg total dose.  Last treatment was given in August 2020.    Therapy was held today in anticipation of high-dose chemotherapy and autologous stem cell transplant.  He did not receive stem cell transplant due to subdural hematoma and sepsis in September 2020.  Velcade at 1.5 mg per metered squared subcutaneously weekly, dexamethasone 20 mg weekly, daratumumab 1800 mg subcutaneous injection.   Cycle 1 started on 08/30/2019.  Therapy concluded in June 2021.   Current therapy:  Daratumumab monthly maintenance started in June 2021.  Interim History:  Isaiah [name] presents today for a follow-up visit.  Since the last visit, he reports no major changes in his health.  He denies any recent hospitalization or illnesses.  He denies any excessive fatigue, tiredness or hematochezia.  He denies melena or hemoptysis.  His performance status quality of life remained stable and improving slowly.  Continues to participate in outpatient rehab and tolerates dialysis without any issues.  He denies any issues related to daratumumab which she has had tolerated with a monthly schedule.                      Medications: Updated on review..  Current Outpatient Medications:  .  acetaminophen (TYLENOL) 325 MG tablet, Take 1-2 tablets (325-650 mg total) by mouth every 4 (four) hours as needed for mild pain., Disp:  , Rfl:  .  albuterol (PROAIR HFA) 108 (90 Base) MCG/ACT inhaler, Inhale two puffs every 4-6 hours if needed for cough or wheeze (Patient taking differently: Inhale 2 puffs into the lungs every 4 (four) hours as  needed for wheezing or shortness of breath. Inhale two puffs every 4-6 hours if needed for cough or wheeze), Disp: 6.7 g, Rfl: 2 .  Alcohol Swabs (SM ALCOHOL PREP) 70 % PADS, , Disp: , Rfl:  .  atorvastatin (LIPITOR) 20 MG tablet, TAKE 1 TABLET BY MOUTH ONCE A DAY, Disp: 30 tablet, Rfl: 0 .  AURYXIA 1 GM 210 MG(Fe) tablet, Take 210 mg by mouth 3 (three) times daily., Disp: , Rfl:  .  BREO ELLIPTA 200-25 MCG/INH AEPB, INHALE 1 PUFF BY MOUTH AT THE SAME TIME EVERY DAY (Patient taking differently: Inhale 1 puff into the lungs daily. ), Disp: 60 each, Rfl: 2 .  brimonidine (ALPHAGAN) 0.2 % ophthalmic solution, Place 1 drop into the right eye 2 (two) times daily. (Patient not taking: Reported on 04/12/2020), Disp: , Rfl:  .  camphor-menthol (SARNA) lotion, Apply 1 application topically every 8 (eight) hours as needed for itching., Disp: 222 mL, Rfl: 0 .  Cetirizine HCl 10 MG CAPS, Take 1 capsule by mouth daily. , Disp: , Rfl:  .  clonazePAM (KLONOPIN) 0.5 MG tablet, Take 2 tablets every Monday, Wednesday, and Friday with hemodialysis (Patient taking differently: Take 1 mg by mouth every Monday, Wednesday, and Friday. Before dialysis), Disp: 30 tablet, Rfl: 5 .  FREESTYLE LITE test strip, , Disp: , Rfl:  .  hydrOXYzine (ATARAX/VISTARIL) 25 MG tablet, Take 1 tablet (25 mg total) by mouth every 8 (eight) hours as needed for itching., Disp: 30 tablet, Rfl: 0 .  Insulin Glargine (BASAGLAR KWIKPEN) 100 UNIT/ML, Inject 10 Units into the skin daily. , Disp: , Rfl:  .  Insulin Lispro (HUMALOG KWIKPEN [place]), Inject 2-4 Units into the skin 3 (three) times daily before meals. Sliding scale: 150-250 =2 units, 250 -350=3 units, 350 += 4 units, Disp: , Rfl:  .  lacosamide (VIMPAT) 200 MG TABS tablet, Take 1 tablet twice a day except on dialysis days M-W-F give 1 tab in AM, 1 tab after dialysis, 1 tab in PM. (Patient not taking: Reported on 04/19/2020), Disp: 90 tablet, Rfl: 5 .  lamoTRIgine (LAMICTAL) 200 MG tablet, Take 1  tablet (200 mg total) by mouth 2 (two) times daily., Disp: 60 tablet, Rfl: 11 .  Lancets (FREESTYLE) lancets, , Disp: , Rfl:  .  metoprolol tartrate (LOPRESSOR) 25 MG tablet, Take 25 mg by mouth daily., Disp: , Rfl:  .  metroNIDAZOLE (FLAGYL) 250 MG tablet, Take 1 tablet (250 mg total) by mouth every 8 (eight) hours. (Patient not taking: Reported on 03/29/2020), Disp: 9 tablet, Rfl: 0 .  midodrine (PROAMATINE) 10 MG tablet, TAKE 1 TABLET BY MOUTH TWICE DAILY WITH MEALS, Disp: 60 tablet, Rfl: 0 .  multivitamin (RENA-VIT) TABS tablet, Take 1 tablet by mouth at bedtime., Disp: , Rfl:  .  Nutritional Supplements (FEEDING SUPPLEMENT, NEPRO CARB STEADY,) LIQD, Take 237 mLs by mouth 3 (three) times daily as needed (Supplement)., Disp: 237 mL, Rfl: 12 .  pantoprazole (PROTONIX) 40 MG tablet, TAKE 1 TABLET BY MOUTH ONCE DAILY (Patient taking differently: Take 40 mg by mouth daily. ), Disp: 90 tablet, Rfl: 1 .  prednisoLONE acetate (PRED FORTE) 1 % ophthalmic suspension, Place 1 drop into the right eye every hour while awake., Disp: , Rfl:  .  prochlorperazine (COMPAZINE) 10 MG tablet, TAKE 1 TABLET (10 MG TOTAL) BY MOUTH EVERY 6 (SIX) HOURS AS NEEDED FOR NAUSEA OR VOMITING., Disp: 30 tablet, Rfl: 0 .  UNIFINE PENTIPS 31G X 8 MM MISC, USE AS DIRECTED DAILY AFTER SUPPER, Disp: 100 each, Rfl: 3  Allergies:  Allergies  Allergen Reactions  . Codeine Swelling  . Codeine Rash and Other (See Comments)    Unknown reaction (patient says it was more serious than just a rash, but he can't remember what happened)       Physical Exam:      Blood pressure (!) 150/40, pulse 81, temperature 98.1 F (36.7 C), temperature source Tympanic, resp. rate 20, height _0  (1.778 m), weight 188 lb 1.6 oz (85.3 kg), SpO2 99 %.        ECOG: 1    General appearance: Comfortable appearing without any discomfort Head: Normocephalic without any trauma Oropharynx: Mucous membranes are moist and pink without any  thrush or ulcers. Eyes: Pupils are equal and round reactive to light. Lymph nodes: No cervical, supraclavicular, inguinal or axillary lymphadenopathy.   Heart:regular rate and rhythm.  S1 and S2 without leg edema. Lung: Clear without any rhonchi or wheezes.  No dullness to percussion. Abdomin: Soft, nontender, nondistended with good bowel sounds.  No hepatosplenomegaly. Musculoskeletal: No joint deformity or effusion.  Full range of motion noted. Neurological: No deficits noted on motor, sensory and deep tendon reflex exam. Skin: No petechial rash or dryness.  Appeared moist.                   Lab Results: Lab Results  Component Value Date   WBC 10.5 03/27/2020   HGB 11.0 (L) 03/27/2020   HCT 29.8 (L) 03/27/2020  MCV 89.5 03/27/2020   PLT 354 03/27/2020     Chemistry      Component Value Date/Time   NA 141 03/27/2020 1130   NA 142 02/15/2020 0000   NA 138 07/03/2017 0813   K 4.9 03/27/2020 1130   K 4.2 07/03/2017 0813   CL 95 (L) 03/27/2020 1130   CO2 30 03/27/2020 1130   CO2 27 07/03/2017 0813   BUN 44 (H) 03/27/2020 1130   BUN 41 (A) 03/22/2020 0000   BUN 46.4 (H) 07/03/2017 0813   CREATININE 8.12 (HH) 03/27/2020 1130   CREATININE 10.9 (HH) 07/03/2017 0813   GLU 157 03/22/2020 0000      Component Value Date/Time   CALCIUM 9.6 03/27/2020 1130   CALCIUM 9.3 07/03/2017 0813   ALKPHOS 128 (H) 03/27/2020 1130   ALKPHOS 77 07/03/2017 0813   AST 19 03/27/2020 1130   AST 16 07/03/2017 0813   ALT 12 03/27/2020 1130   ALT 14 07/03/2017 0813   BILITOT 0.9 03/27/2020 1130   BILITOT 0.93 07/03/2017 0813     Results for WEYMAN, BOGDON (MRN [id number]) as of 04/24/2020 11:41  Ref. Range 01/10/2020 10:07 02/14/2020 10:08 03/27/2020 11:30  M Protein SerPl Elph-Mcnc Latest Ref Range: Not Observed g/dL 0.2 (H) Not Observed Not Observed  IFE 1 Unknown Comment (A) Comment (A) Comment  Globulin, Total Latest Ref Range: 2.2 - 3.9 g/dL 2.5 2.7 2.4  B-Globulin SerPl  Elph-Mcnc Latest Ref Range: 0.7 - 1.3 g/dL 0.8 0.9 0.7  IgG (Immunoglobin G), Serum Latest Ref Range: 603 - 1,613 mg/dL 726 648 585 (L)  IgM (Immunoglobulin M), Srm Latest Ref Range: 20 - 172 mg/dL 18 (L) 18 (L) 21  IgA Latest Ref Range: 90 - 386 mg/dL 23 (L) 26 (L) 36 (L)    Results for DEPAUL, ARIZPE (MRN [id number]) as of 04/24/2020 11:41  Ref. Range 02/14/2020 10:08 03/27/2020 11:30  Kappa free light chain Latest Ref Range: 3.3 - 19.4 mg/L 123.2 (H) 123.7 (H)  Lamda free light chains Latest Ref Range: 5.7 - 26.3 mg/L 48.9 (H) 49.9 (H)  Kappa, lamda light chain ratio Latest Ref Range: 0.26 - 1.65  2.52 (H) 2.48 (H)      Impression and Plan:  54 year old man with:  1.  IgG kappa multiple myeloma diagnosed in 2019.     He continues to receive maintenance of daratumumab without any major complications.  Laboratory data obtained on July 28 including protein studies were reviewed and continues to show normalization of his M protein with decreased IgG and IgA level.  His kappa to lambda ratio remains mildly elevated.  Risks and benefits of continuing this treatment long-term were discussed.  Alternative options of therapy including active surveillance were reviewed.  After discussion today he is agreeable to continue.   2. Anemia: Related to plasma cell disorder and renal failure.  Hemoglobin have been stable without any need for transfusion.   3.  Antiemetics: No nausea or vomiting reported at this time.  4.  Renal failure: He continues to receive dialysis without any issues.  5. Goals of care: Therapy remains palliative and unlikely to be cured although aggressive measures are warranted.   6. Follow-up: He will return in 4 weeks for repeat follow-up.   30 minutes were spent on this encounter.  Time was dedicated to reviewing his disease status, discussing treatment options and outlining future plan of care review.    Zola Button, MD 8/17/202111:40 AM

## 2020-04-24 NOTE — Telephone Encounter (Signed)
Refill request hydroxyzine

## 2020-04-25 DIAGNOSIS — Z992 Dependence on renal dialysis: Secondary | ICD-10-CM | POA: Diagnosis not present

## 2020-04-25 DIAGNOSIS — N186 End stage renal disease: Secondary | ICD-10-CM | POA: Diagnosis not present

## 2020-04-25 DIAGNOSIS — N2581 Secondary hyperparathyroidism of renal origin: Secondary | ICD-10-CM | POA: Diagnosis not present

## 2020-04-25 DIAGNOSIS — E1129 Type 2 diabetes mellitus with other diabetic kidney complication: Secondary | ICD-10-CM | POA: Diagnosis not present

## 2020-04-25 DIAGNOSIS — D509 Iron deficiency anemia, unspecified: Secondary | ICD-10-CM | POA: Diagnosis not present

## 2020-04-25 DIAGNOSIS — D631 Anemia in chronic kidney disease: Secondary | ICD-10-CM | POA: Diagnosis not present

## 2020-04-25 LAB — KAPPA/LAMBDA LIGHT CHAINS
Kappa free light chain: 120.8 mg/L — ABNORMAL HIGH (ref 3.3–19.4)
Kappa, lambda light chain ratio: 2.78 — ABNORMAL HIGH (ref 0.26–1.65)
Lambda free light chains: 43.4 mg/L — ABNORMAL HIGH (ref 5.7–26.3)

## 2020-04-25 MED FILL — HYDROXYZINE HCL 25 MG TABS: 25 | 10 days supply | Qty: 30 | Fill #0

## 2020-04-26 ENCOUNTER — Encounter (INDEPENDENT_AMBULATORY_CARE_PROVIDER_SITE_OTHER): Payer: Medicare Other | Admitting: Ophthalmology

## 2020-04-26 ENCOUNTER — Telehealth: Payer: Self-pay | Admitting: *Deleted

## 2020-04-26 ENCOUNTER — Ambulatory Visit: Payer: Medicare Other | Admitting: Physical Therapy

## 2020-04-26 DIAGNOSIS — H4311 Vitreous hemorrhage, right eye: Secondary | ICD-10-CM

## 2020-04-26 DIAGNOSIS — H35033 Hypertensive retinopathy, bilateral: Secondary | ICD-10-CM

## 2020-04-26 DIAGNOSIS — E113593 Type 2 diabetes mellitus with proliferative diabetic retinopathy without macular edema, bilateral: Secondary | ICD-10-CM

## 2020-04-26 DIAGNOSIS — I10 Essential (primary) hypertension: Secondary | ICD-10-CM

## 2020-04-26 DIAGNOSIS — H2101 Hyphema, right eye: Secondary | ICD-10-CM

## 2020-04-26 DIAGNOSIS — H04123 Dry eye syndrome of bilateral lacrimal glands: Secondary | ICD-10-CM

## 2020-04-26 DIAGNOSIS — D571 Sickle-cell disease without crisis: Secondary | ICD-10-CM

## 2020-04-26 DIAGNOSIS — Z961 Presence of intraocular lens: Secondary | ICD-10-CM

## 2020-04-26 DIAGNOSIS — H3581 Retinal edema: Secondary | ICD-10-CM

## 2020-04-26 DIAGNOSIS — E109 Type 1 diabetes mellitus without complications: Secondary | ICD-10-CM | POA: Diagnosis not present

## 2020-04-26 NOTE — Telephone Encounter (Signed)
[facility] called to speak with [name] about a Systems analyst for Isaiah [name]. [name] is out of the office until Monday 04/30/20.I have let her know by voicemail message that [name] will return her call when she returns, and if she needs something more to call us back.

## 2020-04-26 NOTE — Progress Notes (Signed)
[facility] Note  05/01/2020     CHIEF COMPLAINT Patient presents for Retina Follow Up   HISTORY OF PRESENT ILLNESS: Isaiah [name] is a 54 y.o. male who presents to the clinic today for:   HPI    Retina Follow Up    Patient presents with  Other.  In right eye.  This started weeks ago.  Severity is moderate.  Duration of weeks.  Since onset it is stable.  I, the attending physician,  performed the HPI with the patient and updated documentation appropriately.          Comments    Pt states vision is OK OU.  Patient denies eye pain OS.  Patient has been having some eye pain OD over the last few days.  Patient denies any new or worsening floaters or fol OU.       Last edited by [name], MD on 05/01/2020 11:45 AM. (History)    pt here for [name] OD  Referring physician: [place], [place], [place],  Manuel [place] [id number]  HISTORICAL INFORMATION:   Selected notes from the MEDICAL RECORD NUMBER Pt of Dr. [name].  Urgent re-referral from Dr. [name] for possible PDR w/NVI OD.  Presented to [facility] on 7.27.21 w/elevated IOPS and OD swelling and irritation.  Started on Brimonidine BID OD and Pred Q1hr OD.    CURRENT MEDICATIONS: Current Outpatient Medications (Ophthalmic Drugs)  Medication Sig  . brimonidine (ALPHAGAN) 0.2 % ophthalmic solution Place 1 drop into the right eye 2 (two) times daily.   . prednisoLONE acetate (PRED FORTE) 1 % ophthalmic suspension Place 1 drop into the right eye 4 (four) times daily for 7 days.   No current facility-administered medications for this visit. (Ophthalmic Drugs)   Current Outpatient Medications (Other)  Medication Sig  . acetaminophen (TYLENOL) 325 MG tablet Take 1-2 tablets (325-650 mg total) by mouth every 4 (four) hours as needed for mild pain.  [name] albuterol (PROAIR HFA) 108 (90 Base) MCG/ACT inhaler Inhale two puffs every 4-6 hours if needed for cough or wheeze (Patient taking  differently: Inhale 2 puffs into the lungs every 4 (four) hours as needed for wheezing or shortness of breath. Inhale two puffs every 4-6 hours if needed for cough or wheeze)  . Alcohol Swabs (SM ALCOHOL PREP) 70 % PADS   . atorvastatin (LIPITOR) 20 MG tablet TAKE 1 TABLET BY MOUTH ONCE A DAY  . AURYXIA 1 GM 210 MG(Fe) tablet Take 210 mg by mouth 3 (three) times daily.  [name] BREO ELLIPTA 200-25 MCG/INH AEPB INHALE 1 PUFF BY MOUTH AT THE SAME TIME EVERY DAY (Patient taking differently: Inhale 1 puff into the lungs daily. )  . camphor-menthol (SARNA) lotion Apply 1 application topically every 8 (eight) hours as needed for itching.  . Cetirizine HCl 10 MG CAPS Take 1 capsule by mouth daily.   . clonazePAM (KLONOPIN) 0.5 MG tablet Take 2 tablets every Monday, Wednesday, and Friday with hemodialysis (Patient taking differently: Take 1 mg by mouth every Monday, Wednesday, and Friday. Before dialysis)  . FREESTYLE LITE test strip   . hydrOXYzine (ATARAX/VISTARIL) 25 MG tablet TAKE 1 TABLET (25 MG TOTAL) BY MOUTH EVERY 8 (EIGHT) HOURS AS NEEDED FOR ITCHING.  . Insulin Glargine (BASAGLAR KWIKPEN) 100 UNIT/ML Inject 10 Units into the skin daily.   . Insulin Lispro (HUMALOG KWIKPEN [place]) Inject 2-4 Units into the skin 3 (three) times daily before meals. Sliding scale: 150-250 =  2 units, 250 -350=3 units, 350 += 4 units  . lacosamide (VIMPAT) 200 MG TABS tablet Take 1 tablet twice a day except on dialysis days M-W-F give 1 tab in AM, 1 tab after dialysis, 1 tab in PM.  . lamoTRIgine (LAMICTAL) 200 MG tablet Take 1 tablet (200 mg total) by mouth 2 (two) times daily.  . Lancets (FREESTYLE) lancets   . metoprolol tartrate (LOPRESSOR) 25 MG tablet Take 25 mg by mouth daily.  . metroNIDAZOLE (FLAGYL) 250 MG tablet Take 1 tablet (250 mg total) by mouth every 8 (eight) hours.  . midodrine (PROAMATINE) 10 MG tablet TAKE 1 TABLET BY MOUTH TWICE DAILY WITH MEALS  . multivitamin (RENA-VIT) TABS tablet Take 1 tablet by mouth at  bedtime.  . Nutritional Supplements (FEEDING SUPPLEMENT, NEPRO CARB STEADY,) LIQD Take 237 mLs by mouth 3 (three) times daily as needed (Supplement).  . pantoprazole (PROTONIX) 40 MG tablet TAKE 1 TABLET BY MOUTH ONCE DAILY (Patient taking differently: Take 40 mg by mouth daily. )  . prochlorperazine (COMPAZINE) 10 MG tablet TAKE 1 TABLET (10 MG TOTAL) BY MOUTH EVERY 6 (SIX) HOURS AS NEEDED FOR NAUSEA OR VOMITING.  [name] UNIFINE PENTIPS 31G X 8 MM MISC USE AS DIRECTED DAILY AFTER SUPPER   No current facility-administered medications for this visit. (Other)   REVIEW OF SYSTEMS: ROS    Positive for: Neurological, Genitourinary, Endocrine, Eyes   Negative for: Constitutional, Gastrointestinal, Skin, Musculoskeletal, HENT, Cardiovascular, Respiratory, Psychiatric, Allergic/Imm, Heme/Lymph   Last edited by Doneen Poisson on 05/01/2020  9:43 AM. (History)     ALLERGIES Allergies  Allergen Reactions  . Codeine Swelling  . Codeine Rash and Other (See Comments)    Unknown reaction (patient says it was more serious than just a rash, but he can't remember what happened)    PAST MEDICAL HISTORY Past Medical History:  Diagnosis Date  . A-fib ([place])   . Anemia   . Asthma   . DM type 2 (diabetes mellitus, type 2) ([place]) 06/09/2019  . ESRD (end stage renal disease) on dialysis ([place])   . Essential hypertension 06/09/2019  . GIB (gastrointestinal bleeding)    Recurrent episodes- 09/2014, 09/2015 and 05/2016  . Gout   . History of recent blood transfusion 10/27/14   2 Units PRBC's  . Hyperkalemia 07/2011  . Multiple myeloma ([place])   . OSA on CPAP   . Pulmonary embolism ([place]) 07/2011; 09/27/2014   a. Bilat PE 07/2011 - unclear cause, tx with 6 months Coumadin.;   . Seizure disorder ([place]) 06/09/2019  . Sepsis ([place])   . Sickle cell-thalassemia disease ([place])    a. Sickle cell trait.  . Sleep apnea   . Stroke ([place]) 09/2015   R-MCA, L-MCA, PCA and bilateral cerebellar complicated by DVT/PE  . Subdural  hematoma ([place]) 05/2019   Past Surgical History:  Procedure Laterality Date  . AV FISTULA PLACEMENT Right 12/05/2015   Procedure: INSERTION OF ARTERIOVENOUS (AV) GORE-TEX GRAFT ARM;  Surgeon: Elam Dutch, MD;  Location: [place];  Service: Vascular;  Laterality: Right;  . BONE MARROW BIOPSY    . CATARACT EXTRACTION  08/2019  . CATARACT EXTRACTION  09/2019  . CHOLECYSTECTOMY    . CHOLECYSTECTOMY  1990's?  . COLONOSCOPY WITH PROPOFOL N/A 01/22/2017   Procedure: COLONOSCOPY WITH PROPOFOL;  Surgeon: Wilford Corner, MD;  Location: [facility];  Service: Endoscopy;  Laterality: N/A;  . EYE SURGERY Right   . INSERTION OF DIALYSIS CATHETER Right 10/16/2015   Procedure: INSERTION  OF PALINDROME DIALYSIS CATHETER ;  Surgeon: Elam Dutch, MD;  Location: [place];  Service: Vascular;  Laterality: Right;  . OTHER SURGICAL HISTORY     Retinal surgery  . PARS PLANA VITRECTOMY  02/17/2012   Procedure: PARS PLANA VITRECTOMY WITH 25 GAUGE;  Surgeon: Hayden Pedro, MD;  Location: [place];  Service: Ophthalmology;  Laterality: Right;  . PERIPHERAL VASCULAR CATHETERIZATION N/A 03/20/2016   Procedure: A/V Shuntogram/Fistulagram;  Surgeon: Conrad , MD;  Location: [place] [facility];  Service: Cardiovascular;  Laterality: N/A;  . TEE WITHOUT CARDIOVERSION N/A 12/02/2016   Procedure: TRANSESOPHAGEAL ECHOCARDIOGRAM (TEE);  Surgeon: Larey Dresser, MD;  [facility];  Service: Cardiovascular;  Laterality: N/A;   FAMILY HISTORY Family History  Problem Relation Age of Onset  . Hypertension Mother   . Hypertension Father   . Stroke Father   . Sickle cell anemia Brother   . Diabetes Mother   . Sickle cell anemia Brother   . Diabetes type I Brother   . Kidney disease Brother    SOCIAL HISTORY Social History   Tobacco Use  . Smoking status: Never Smoker  . Smokeless tobacco: Never Used  Vaping Use  . Vaping Use: Never used  Substance Use Topics  . Alcohol use: Not Currently  . Drug use:  Never         OPHTHALMIC EXAM:  Base Eye Exam    Visual Acuity (Snellen - Linear)      Right Left   Dist [place] 20/200 -1 20/200 -2   Dist ph [place] NI        Tonometry (Tonopen, 9:51 AM)      Right Left   Pressure 12 13       Pupils      Dark Light Shape React APD   Right 5 5 Round NR 0   Left 2 1 Round Minimal 0       Visual Fields      Left Right    Full    Restrictions  Partial outer inferior nasal deficiency       Extraocular Movement      Right Left    Full Full       Neuro/Psych    Oriented x3: Yes   Mood/Affect: Normal       Dilation    Both eyes: 1.0% Mydriacyl, 2.5% Phenylephrine @ 9:51 AM        Slit Lamp and Fundus Exam    Slit Lamp Exam      Right Left   Lids/Lashes Dermato Dermato   Conjunctiva/Sclera Melanosis Melanosis   Cornea Arcus well healed cataract wound, 2-3+ PEE 3+ PEE, mild cataract wounds   Anterior Chamber Deep and quiet Deep.  0.5+ pigment   Iris Round and poorly dilated to 4.18m, No NVI Round and dilated   Lens PC IOL in good position PCIOL   Vitreous Post vitrectomy.  Residual anterior vitreous syneresis.  +RBC - improved Mild asteroid hyalosis, syneresis       Fundus Exam      Right Left   Disc 2+pallor. Sharp rim. Hazy view.  Perfused.   C/D Ratio 0.7 0.5   Macula Blunted foveal reflex. atrophic; ischemic Hazy view.  Grossly flat.   Vessels Attenuated, tortuous, severe midzonal attenuation and ischemia Attenuated, tortuous   Periphery Attached, Peripheral laser scars, room for fill in, Scattered IRH Attached.  360 PRP        Refraction    Wearing  Rx      Sphere Cylinder Axis Add   Right -1.75 +0.50 090 +2.50   Left -1.50 Sphere  +2.50          IMAGING AND PROCEDURES  Imaging and Procedures for 05/01/2020  OCT, Retina - OU - Both Eyes       Right Eye Quality was good. Central Foveal Thickness: 270. Progression has been stable. Findings include normal foveal contour, no IRF, no SRF, outer retinal atrophy,  vitreomacular adhesion , epiretinal membrane (Stable improvement in vitreous opacities; diffuse retinal atrophy).   Left Eye Quality was good. Central Foveal Thickness: 251. Progression has been stable. Findings include normal foveal contour, no SRF (Diffuse retinal atrophy/thinning; stable retinoschisis temporal midzone caught on widefield).   Notes *Images captured and stored on drive  Diagnosis / Impression:  OD: stable improvement in vitreous opacities; diffuse retinal atrophy OS: Diffuse retinal atrophy/thinning; retinoschisis temporal midzone caught on widefield -- stable  Clinical management:  See below  Abbreviations: NFP - Normal foveal profile. CME - cystoid macular edema. PED - pigment epithelial detachment. IRF - intraretinal fluid. SRF - subretinal fluid. EZ - ellipsoid zone. ERM - epiretinal membrane. ORA - outer retinal atrophy. ORT - outer retinal tubulation. SRHM - subretinal hyper-reflective material. IRHM - intraretinal hyper-reflective material       Panretinal Photocoagulation - OD - Right Eye       LASER PROCEDURE NOTE  Diagnosis:   Proliferative Diabetic Retinopathy, RIGHT EYE  Procedure:  Fill-in pan-retinal photocoagulation using slit lamp laser, RIGHT EYE  Anesthesia:  Topical  Surgeon: [name], MD, PhD   Informed consent obtained, operative eye marked, and time out performed prior to initiation of laser.   Lumenis [id number] slit lamp laser Pattern: 3x3 square Power: 230 mW Duration: 30 msec  Spot size: 200 microns  # spots: 1116 spots 360 fill-in posterior to existing laser  Complications: None.  RTC: 6 wks  Patient tolerated the procedure well and received written and verbal post-procedure care information/education.                  ASSESSMENT/PLAN:    ICD-10-CM   1. Vitreous hemorrhage, right eye (HCC)  H43.11   2. Hyphema of right eye  H21.01   3. Proliferative diabetic retinopathy of both eyes without macular edema  associated with type 2 diabetes mellitus ([place])  G50.0370 Panretinal Photocoagulation - OD - Right Eye  4. Retinal edema  H35.81 OCT, Retina - OU - Both Eyes  5. Sickle cell retinopathy without crisis ([place])  D57.1    H36   6. Essential hypertension  I10   7. Hypertensive retinopathy of both eyes  H35.033   8. Pseudophakia of both eyes  Z96.1   9. Bilateral dry eyes  H04.123    1,2. VH & microyphema OD secondary to PDR  - s/p IVA OD #1 (07.29.21)  - VH and microhyphema essentially resolved OD  - NVI OD regressed      - On PF -- decreased to 2x a day by Dr. [name]      - cont Cosopt BID OD      - okay to stop cyclopentalate BID OD  - VH precautions reviewed -- minimize activities, keep head elevated, avoid ASA/NSAIDs/blood thinners as able  - F/u in 6 wks w/ DFE/OCT  3,4. Proliferative diabetic retinopathy w/ retinal ischemia, no DME, OU (OD > OS)  - pt of Dr. [name], last visit 03.04.21  - history of PPV w/ endolaser, FAX  OD 6.11.13; PRP OS 2020  - s/p IVA OD #1(7.29.21) as above for new VH and microhyphema  - exam shows NVI regressed and VH improving OD; OS with good 360 PRP and +asteroid hyalosis  - OCT without diabetic macular edema, but diffuse atrophy, both eyes   - FA (08.12.21) shows severe vascular perfusion OD -- peripheral nonperfusion extends posteriorly just outside arcades   - recommend PRP OD today, 08.24.21, to areas of vascular nonperfusion  - pt wishes to proceed  - RBA of procedure discussed, questions answered  - informed consent obtained and signed  - see procedure note  - F/u in 6 wks w/DFE/OCT  5. History of Sickle Cell Retinopathy  - 360 PRP in place OU  - no active NV, confirmed on FA 8.12.21  6,7. Hypertensive retinopathy OU - discussed importance of tight BP control - monitor  8. Pseudophakia OU  - s/p CE/IOL OU (OD Jan 2021; OS Feb 2021  - IOL in good position, doing well  - monitor  9. Dry eyes OU - recommend artificial tears and lubricating  ointment as needed  Ophthalmic Meds Ordered this visit:  Meds ordered this encounter  Medications  . prednisoLONE acetate (PRED FORTE) 1 % ophthalmic suspension    Sig: Place 1 drop into the right eye 4 (four) times daily for 7 days.    Dispense:  10 mL    Refill:  0      Return in about 6 weeks (around 06/12/2020) for PDR w/ VH OD  -- Dilated Exam, OCT.  There are no Patient Instructions on file for this visit.  Explained the diagnoses, plan, and follow up with the patient and they expressed understanding.  Patient expressed understanding of the importance of proper follow up care.  This document serves as a record of services personally performed by Gardiner Sleeper, MD, PhD. It was created on their behalf by Estill Bakes, COT an ophthalmic technician. The creation of this record is the provider's dictation and/or activities during the visit.    Electronically signed by: Estill Bakes, COT 8.19.21 @ 12:08 PM   This document serves as a record of services personally performed by Gardiner Sleeper, MD, PhD. It was created on their behalf by San Jetty. Owens Shark, OA an ophthalmic technician. The creation of this record is the provider's dictation and/or activities during the visit.    Electronically signed by: San Jetty. Owens Shark, [place] 08.24.2021 12:08 PM  Gardiner Sleeper, M.D., Ph.D. Diseases & Surgery of the Retina and [place] 05/01/2020   I have reviewed the above documentation for accuracy and completeness, and I agree with the above. Gardiner Sleeper, M.D., Ph.D. 05/01/20 12:08 PM   Abbreviations: M myopia (nearsighted); A astigmatism; H hyperopia (farsighted); P presbyopia; Mrx spectacle prescription;  CTL contact lenses; OD right eye; OS left eye; OU both eyes  XT exotropia; ET esotropia; PEK punctate epithelial keratitis; PEE punctate epithelial erosions; DES dry eye syndrome; MGD meibomian gland dysfunction; ATs artificial tears; PFAT's preservative  free artificial tears; [place] nuclear sclerotic cataract; PSC posterior subcapsular cataract; ERM epi-retinal membrane; PVD posterior vitreous detachment; RD retinal detachment; DM diabetes mellitus; DR diabetic retinopathy; NPDR non-proliferative diabetic retinopathy; PDR proliferative diabetic retinopathy; CSME clinically significant macular edema; DME diabetic macular edema; dbh dot blot hemorrhages; CWS cotton wool spot; POAG primary open angle glaucoma; C/D cup-to-disc ratio; HVF humphrey visual field; GVF goldmann visual field; OCT optical coherence tomography; IOP intraocular pressure; BRVO Branch  retinal vein occlusion; CRVO central retinal vein occlusion; CRAO central retinal artery occlusion; BRAO branch retinal artery occlusion; RT retinal tear; SB scleral buckle; PPV pars plana vitrectomy; VH Vitreous hemorrhage; PRP panretinal laser photocoagulation; IVK intravitreal kenalog; VMT vitreomacular traction; MH Macular hole;  NVD neovascularization of the disc; NVE neovascularization elsewhere; AREDS age related eye disease study; ARMD age related macular degeneration; POAG primary open angle glaucoma; EBMD epithelial/anterior basement membrane dystrophy; ACIOL anterior chamber intraocular lens; IOL intraocular lens; PCIOL posterior chamber intraocular lens; Phaco/IOL phacoemulsification with intraocular lens placement; [place] photorefractive keratectomy; LASIK laser assisted in situ keratomileusis; HTN hypertension; DM diabetes mellitus; COPD chronic obstructive pulmonary disease

## 2020-04-27 DIAGNOSIS — E1129 Type 2 diabetes mellitus with other diabetic kidney complication: Secondary | ICD-10-CM | POA: Diagnosis not present

## 2020-04-27 DIAGNOSIS — Z992 Dependence on renal dialysis: Secondary | ICD-10-CM | POA: Diagnosis not present

## 2020-04-27 DIAGNOSIS — N186 End stage renal disease: Secondary | ICD-10-CM | POA: Diagnosis not present

## 2020-04-27 DIAGNOSIS — D509 Iron deficiency anemia, unspecified: Secondary | ICD-10-CM | POA: Diagnosis not present

## 2020-04-27 DIAGNOSIS — N2581 Secondary hyperparathyroidism of renal origin: Secondary | ICD-10-CM | POA: Diagnosis not present

## 2020-04-27 DIAGNOSIS — D631 Anemia in chronic kidney disease: Secondary | ICD-10-CM | POA: Diagnosis not present

## 2020-04-27 LAB — MULTIPLE MYELOMA PANEL, SERUM
Albumin SerPl Elph-Mcnc: 4.7 g/dL — ABNORMAL HIGH (ref 2.9–4.4)
Albumin/Glob SerPl: 2.5 — ABNORMAL HIGH (ref 0.7–1.7)
Alpha 1: 0.2 g/dL (ref 0.0–0.4)
Alpha2 Glob SerPl Elph-Mcnc: 0.6 g/dL (ref 0.4–1.0)
B-Globulin SerPl Elph-Mcnc: 0.6 g/dL — ABNORMAL LOW (ref 0.7–1.3)
Gamma Glob SerPl Elph-Mcnc: 0.4 g/dL (ref 0.4–1.8)
Globulin, Total: 1.9 g/dL — ABNORMAL LOW (ref 2.2–3.9)
IgA: 31 mg/dL — ABNORMAL LOW (ref 90–386)
IgG (Immunoglobin G), Serum: 566 mg/dL — ABNORMAL LOW (ref 603–1613)
IgM (Immunoglobulin M), Srm: 21 mg/dL (ref 20–172)
Total Protein ELP: 6.6 g/dL (ref 6.0–8.5)

## 2020-04-30 DIAGNOSIS — D631 Anemia in chronic kidney disease: Secondary | ICD-10-CM | POA: Diagnosis not present

## 2020-04-30 DIAGNOSIS — E1129 Type 2 diabetes mellitus with other diabetic kidney complication: Secondary | ICD-10-CM | POA: Diagnosis not present

## 2020-04-30 DIAGNOSIS — D509 Iron deficiency anemia, unspecified: Secondary | ICD-10-CM | POA: Diagnosis not present

## 2020-04-30 DIAGNOSIS — N186 End stage renal disease: Secondary | ICD-10-CM | POA: Diagnosis not present

## 2020-04-30 DIAGNOSIS — Z992 Dependence on renal dialysis: Secondary | ICD-10-CM | POA: Diagnosis not present

## 2020-04-30 DIAGNOSIS — N2581 Secondary hyperparathyroidism of renal origin: Secondary | ICD-10-CM | POA: Diagnosis not present

## 2020-05-01 ENCOUNTER — Ambulatory Visit: Payer: Medicare Other | Admitting: Physical Therapy

## 2020-05-01 ENCOUNTER — Other Ambulatory Visit: Payer: Self-pay

## 2020-05-01 ENCOUNTER — Encounter (INDEPENDENT_AMBULATORY_CARE_PROVIDER_SITE_OTHER): Payer: Self-pay | Admitting: Ophthalmology

## 2020-05-01 ENCOUNTER — Ambulatory Visit (INDEPENDENT_AMBULATORY_CARE_PROVIDER_SITE_OTHER): Payer: Medicare Other | Admitting: Ophthalmology

## 2020-05-01 DIAGNOSIS — H3581 Retinal edema: Secondary | ICD-10-CM | POA: Diagnosis not present

## 2020-05-01 DIAGNOSIS — H4311 Vitreous hemorrhage, right eye: Secondary | ICD-10-CM

## 2020-05-01 DIAGNOSIS — E113593 Type 2 diabetes mellitus with proliferative diabetic retinopathy without macular edema, bilateral: Secondary | ICD-10-CM

## 2020-05-01 DIAGNOSIS — Z961 Presence of intraocular lens: Secondary | ICD-10-CM

## 2020-05-01 DIAGNOSIS — D571 Sickle-cell disease without crisis: Secondary | ICD-10-CM

## 2020-05-01 DIAGNOSIS — I10 Essential (primary) hypertension: Secondary | ICD-10-CM

## 2020-05-01 DIAGNOSIS — H04123 Dry eye syndrome of bilateral lacrimal glands: Secondary | ICD-10-CM

## 2020-05-01 DIAGNOSIS — H2101 Hyphema, right eye: Secondary | ICD-10-CM

## 2020-05-01 DIAGNOSIS — H36 Retinal disorders in diseases classified elsewhere: Secondary | ICD-10-CM

## 2020-05-01 DIAGNOSIS — H35033 Hypertensive retinopathy, bilateral: Secondary | ICD-10-CM

## 2020-05-01 MED ORDER — PREDNISOLONE ACETATE 1 % OP SUSP: 1.0000 [drp] | Freq: Four times a day (QID) | OPHTHALMIC | 0 refills | Status: AC

## 2020-05-01 NOTE — Telephone Encounter (Signed)
Thank you. I will evaluate for wheelchair on next office visit.  Thanks.

## 2020-05-01 NOTE — Telephone Encounter (Signed)
Return Ms. [place] call, a wheelchair evaluation was performed by this provider on 09/06/2019. Received a fax from [facility] on 04/30/2020 regarding transport wheelchair. Placed a call to Ms. [place] regarding the above, will call [place] for clarification, she verbalizes understanding.  Call placed to [facility]: They are looking for an updated wheelchair note within the last 6 months. Dr Posey Pronto seen [name] on 03/08/2020 and next scheduled appointment 05/15/2020 at 10:45.  Dr Posey Pronto will have to addend his 03/08/2020 or document a wheelchair note on 05/15/2020. This message will be routed to Dr Posey Pronto regarding the above.  Placed another call to Ms. [place] regarding the above and she verbalizes understanding.

## 2020-05-02 ENCOUNTER — Ambulatory Visit: Payer: Medicare Other

## 2020-05-02 DIAGNOSIS — E1122 Type 2 diabetes mellitus with diabetic chronic kidney disease: Secondary | ICD-10-CM

## 2020-05-02 DIAGNOSIS — N186 End stage renal disease: Secondary | ICD-10-CM | POA: Diagnosis not present

## 2020-05-02 DIAGNOSIS — Z992 Dependence on renal dialysis: Secondary | ICD-10-CM | POA: Diagnosis not present

## 2020-05-02 DIAGNOSIS — N2581 Secondary hyperparathyroidism of renal origin: Secondary | ICD-10-CM | POA: Diagnosis not present

## 2020-05-02 DIAGNOSIS — C9000 Multiple myeloma not having achieved remission: Secondary | ICD-10-CM

## 2020-05-02 DIAGNOSIS — E1129 Type 2 diabetes mellitus with other diabetic kidney complication: Secondary | ICD-10-CM | POA: Diagnosis not present

## 2020-05-02 DIAGNOSIS — D631 Anemia in chronic kidney disease: Secondary | ICD-10-CM | POA: Diagnosis not present

## 2020-05-02 DIAGNOSIS — D509 Iron deficiency anemia, unspecified: Secondary | ICD-10-CM | POA: Diagnosis not present

## 2020-05-02 NOTE — Telephone Encounter (Signed)
Notified. 

## 2020-05-02 NOTE — Chronic Care Management (AMB) (Signed)
Chronic Care Management    Social Work Follow Up Note  05/02/2020 Name: Isaiah [name] [facility] MRN: [id number] DOB: [birth year]  Isaiah [name] is a 54 y.o. year old male who is a primary care patient of [name], MD. The CCM team was consulted for assistance with care coordination.   Review of patient status, including review of consultants reports, other relevant assessments, and collaboration with appropriate care team members and the patient's provider was performed as part of comprehensive patient evaluation and provision of chronic care management services.    SDOH (Social Determinants of Health) assessments performed: No    Outpatient Encounter Medications as of 05/02/2020  Medication Sig  . acetaminophen (TYLENOL) 325 MG tablet Take 1-2 tablets (325-650 mg total) by mouth every 4 (four) hours as needed for mild pain.  [name] albuterol (PROAIR HFA) 108 (90 Base) MCG/ACT inhaler Inhale two puffs every 4-6 hours if needed for cough or wheeze (Patient taking differently: Inhale 2 puffs into the lungs every 4 (four) hours as needed for wheezing or shortness of breath. Inhale two puffs every 4-6 hours if needed for cough or wheeze)  . Alcohol Swabs (SM ALCOHOL PREP) 70 % PADS   . atorvastatin (LIPITOR) 20 MG tablet TAKE 1 TABLET BY MOUTH ONCE A DAY  . AURYXIA 1 GM 210 MG(Fe) tablet Take 210 mg by mouth 3 (three) times daily.  [name] BREO ELLIPTA 200-25 MCG/INH AEPB INHALE 1 PUFF BY MOUTH AT THE SAME TIME EVERY DAY (Patient taking differently: Inhale 1 puff into the lungs daily. )  . brimonidine (ALPHAGAN) 0.2 % ophthalmic solution Place 1 drop into the right eye 2 (two) times daily.   . camphor-menthol (SARNA) lotion Apply 1 application topically every 8 (eight) hours as needed for itching.  . Cetirizine HCl 10 MG CAPS Take 1 capsule by mouth daily.   . clonazePAM (KLONOPIN) 0.5 MG tablet Take 2 tablets every Monday, Wednesday, and Friday with hemodialysis (Patient taking differently: Take 1 mg by mouth  every Monday, Wednesday, and Friday. Before dialysis)  . FREESTYLE LITE test strip   . hydrOXYzine (ATARAX/VISTARIL) 25 MG tablet TAKE 1 TABLET (25 MG TOTAL) BY MOUTH EVERY 8 (EIGHT) HOURS AS NEEDED FOR ITCHING.  . Insulin Glargine (BASAGLAR KWIKPEN) 100 UNIT/ML Inject 10 Units into the skin daily.   . Insulin Lispro (HUMALOG KWIKPEN [place]) Inject 2-4 Units into the skin 3 (three) times daily before meals. Sliding scale: 150-250 =2 units, 250 -350=3 units, 350 += 4 units  . lacosamide (VIMPAT) 200 MG TABS tablet Take 1 tablet twice a day except on dialysis days M-W-F give 1 tab in AM, 1 tab after dialysis, 1 tab in PM.  . lamoTRIgine (LAMICTAL) 200 MG tablet Take 1 tablet (200 mg total) by mouth 2 (two) times daily.  . Lancets (FREESTYLE) lancets   . metoprolol tartrate (LOPRESSOR) 25 MG tablet Take 25 mg by mouth daily.  . metroNIDAZOLE (FLAGYL) 250 MG tablet Take 1 tablet (250 mg total) by mouth every 8 (eight) hours.  . midodrine (PROAMATINE) 10 MG tablet TAKE 1 TABLET BY MOUTH TWICE DAILY WITH MEALS  . multivitamin (RENA-VIT) TABS tablet Take 1 tablet by mouth at bedtime.  . Nutritional Supplements (FEEDING SUPPLEMENT, NEPRO CARB STEADY,) LIQD Take 237 mLs by mouth 3 (three) times daily as needed (Supplement).  . pantoprazole (PROTONIX) 40 MG tablet TAKE 1 TABLET BY MOUTH ONCE DAILY (Patient taking differently: Take 40 mg by mouth daily. )  . prednisoLONE acetate (PRED FORTE) 1 %  ophthalmic suspension Place 1 drop into the right eye 4 (four) times daily for 7 days.  . prochlorperazine (COMPAZINE) 10 MG tablet TAKE 1 TABLET (10 MG TOTAL) BY MOUTH EVERY 6 (SIX) HOURS AS NEEDED FOR NAUSEA OR VOMITING.  [name] UNIFINE PENTIPS 31G X 8 MM MISC USE AS DIRECTED DAILY AFTER SUPPER   No facility-administered encounter medications on file as of 05/02/2020.     Goals Addressed            This Visit's Progress   . COMPLETED: Collaborate with RN Care Manager to perform apprpriate assessments to determine  care management and care coordination needs       Current Barriers:  . Care management and care coordination needs related to DMII, ESRD, and multiple myeloma  which resulted in inpatient hospitalization . ADL IADL limitations  Clinical Social Work Clinical Goal(s):  [name] Over the next 60 days, patient will collaborate with care management team to assist with care coordination needs in response to recent inpatient hospitalization . New 01/16/20- Over the next 20 days the patient and his spouse will work with SW to identify cause of Medicaid denial and initiate an appeal Goal Met . New 01/25/20 Over the next 60 days the patient and his wife will work with SW to address ongoing care coordination needs related to caregiver assistance  CCM SW Interventions: Completed 05/02/20 with [facility] . Successful outbound call placed to Mrs. Graca to assess goal progression . Determined the patient has yet to have a CAP nursing assessment or receive a determination from [place] o Mrs. Mareno reports she is frustrated with this process and may not proceed if the application needs to be re-submitted again as it did in the past . Discussed barriers to caregiver resources due to ongoing pandemic . Encouraged Mrs. Griffiths to contact SW as needed with ongoing care coordination needs . Goal closed  Completed 04/05/20 with [facility] . Successful outbound call placed to the patients spouse to assess goal progression  . Informed by Mrs. Barsch the patient was just seen by Dr. Coralyn Pear to address a bleed behind the patients right eye o Patient received an injections and prescription for eye drops o Instructed to return in 1 week for a re-check o Collaboration with RN Care Manager regarding recent update to patients health history . Determined Mrs. Brakebill was contacted by Mrs. Larence Penning with the in home aide office requesting a new Medicaid application be completed o Mrs. Gatchel completed this application and submitted to  Mrs. Larence Penning approximately 1 week ago . Discussed concern with staffing challenges which is delaying patients CAP assessment . Determined [facility] has yet to be told when she will need to return to the office setting for full-time hours but she is concerned she may not be able to continue remote work indefinitely to assist with patient care needs . Scheduled follow up call over the next month to assess goal progression o Encouraged Mrs. Michaux to contact SW and or Consulting civil engineer as needed prior to next scheduled call  Completed 03/20/20 . Successful outbound call placed to [facility] in response to voice message received . Mrs. Arreola reports she is still awaiting assessment for CAP services . Mrs. Doenges was contacted by Mrs. Larence Penning with the CAP office who stated they are short staffed but are hoping to be able to complete assessment in the coming weeks  Completed 02/29/20 with [facility] . Successful outbound call placed to the patients spouse to review  goal progression . Determined Mrs. [name] has been in contact with Ilda Mori (contact made on 6/22)  who has assigned a nurse to perform an assessment to determine patient eligibility for CAP/DA services o Mrs. Larence Penning informed Mrs. Ferrara to expect a call over the next 10 days to schedule assessment . Encouraged Mrs. Ohlin to contact SW with outcome of assessment and/or if further SW assistance is needed  . Scheduled follow up call over the next 6 weeks   Patient Self Care Activities:  . Attends all scheduled provider appointments . Calls pharmacy for medication refills . Calls provider office for new concerns or questions . Supportive wife and caregiver to assist with patient care needs   Please see past updates related to this goal by clicking on the "Past Updates" button in the selected goal         Follow Up Plan: No SW follow up planned at this time. The patient will remain active with RN Care Manager.   [name],  BSW, CDP Social Worker, Certified Dementia Practitioner [place] [facility] [place] [facility] [phone]  Total time spent performing care coordination and/or care management activities with the patient by phone or face to face = 8 minutes.

## 2020-05-02 NOTE — Patient Instructions (Signed)
Social Worker Visit Information  Goals we discussed today:  Goals Addressed            This Visit's Progress   . COMPLETED: Collaborate with RN Care Manager to perform apprpriate assessments to determine care management and care coordination needs       Current Barriers:  . Care management and care coordination needs related to DMII, ESRD, and multiple myeloma  which resulted in inpatient hospitalization . ADL IADL limitations  Clinical Social Work Clinical Goal(s):  [name] Over the next 60 days, patient will collaborate with care management team to assist with care coordination needs in response to recent inpatient hospitalization . New 01/16/20- Over the next 20 days the patient and his spouse will work with SW to identify cause of Medicaid denial and initiate an appeal Goal Met . New 01/25/20 Over the next 60 days the patient and his wife will work with SW to address ongoing care coordination needs related to caregiver assistance  CCM SW Interventions: Completed 05/02/20 with [facility] . Successful outbound call placed to Mrs. Popescu to assess goal progression . Determined the patient has yet to have a CAP nursing assessment or receive a determination from [place] o Mrs. Bobeck reports she is frustrated with this process and may not proceed if the application needs to be re-submitted again as it did in the past . Discussed barriers to caregiver resources due to ongoing pandemic . Encouraged Mrs. Pfenning to contact SW as needed with ongoing care coordination needs . Goal closed  Completed 04/05/20 with [name] . Successful outbound call placed to the patients spouse to assess goal progression  . Informed by Mrs. Luse the patient was just seen by Dr. Coralyn Pear to address a bleed behind the patients right eye o Patient received an injections and prescription for eye drops o Instructed to return in 1 week for a re-check o Collaboration with RN Care Manager regarding recent update to  patients health history . Determined Mrs. Nicolaisen was contacted by Mrs. Larence Penning with the in home aide office requesting a new Medicaid application be completed o Mrs. Siller completed this application and submitted to Mrs. Larence Penning approximately 1 week ago . Discussed concern with staffing challenges which is delaying patients CAP assessment . Determined [facility] has yet to be told when she will need to return to the office setting for full-time hours but she is concerned she may not be able to continue remote work indefinitely to assist with patient care needs . Scheduled follow up call over the next month to assess goal progression o Encouraged Mrs. Strupp to contact SW and or Consulting civil engineer as needed prior to next scheduled call  Completed 03/20/20 . Successful outbound call placed to [facility] in response to voice message received . Mrs. Budlong reports she is still awaiting assessment for CAP services . Mrs. Creekmore was contacted by Mrs. Larence Penning with the CAP office who stated they are short staffed but are hoping to be able to complete assessment in the coming weeks  Completed 02/29/20 with [facility] . Successful outbound call placed to the patients spouse to review goal progression . Determined Mrs. [facility] has been in contact with Ilda Mori (contact made on 6/22)  who has assigned a nurse to perform an assessment to determine patient eligibility for CAP/DA services o Mrs. Larence Penning informed Mrs. Kowalczyk to expect a call over the next 10 days to schedule assessment . Encouraged Mrs. Talkington to contact SW with outcome of  assessment and/or if further SW assistance is needed  . Scheduled follow up call over the next 6 weeks   Patient Self Care Activities:  . Attends all scheduled provider appointments . Calls pharmacy for medication refills . Calls provider office for new concerns or questions . Supportive wife and caregiver to assist with patient care needs   Please see past updates related  to this goal by clicking on the "Past Updates" button in the selected goal         Follow Up Plan: No SW follow up planned at this time. Please contact me as needed for ongoing care coordination needs.   [name], BSW, CDP Social Worker, Certified Dementia Practitioner [place] [facility] [place] [facility] [phone]

## 2020-05-03 ENCOUNTER — Ambulatory Visit: Payer: Medicare Other | Admitting: Physical Therapy

## 2020-05-03 ENCOUNTER — Other Ambulatory Visit: Payer: Self-pay

## 2020-05-03 ENCOUNTER — Telehealth: Payer: Self-pay | Admitting: Oncology

## 2020-05-03 DIAGNOSIS — R2689 Other abnormalities of gait and mobility: Secondary | ICD-10-CM

## 2020-05-03 DIAGNOSIS — M6281 Muscle weakness (generalized): Secondary | ICD-10-CM | POA: Diagnosis not present

## 2020-05-03 DIAGNOSIS — R2681 Unsteadiness on feet: Secondary | ICD-10-CM | POA: Diagnosis not present

## 2020-05-03 NOTE — Telephone Encounter (Signed)
Scheduled per 08/17 los, spoke with patient's wife and patient will be notified.

## 2020-05-03 NOTE — Therapy (Signed)
[facility] [address] [place], [place], [address] Phone: [phone]   Fax:  [phone]  Physical Therapy Treatment  Patient Details  Name: Isaiah [name] MRN: [id number] Date of Birth: 21-Sep-1965 Referring Provider (PT): Isaiah Lesch, MD   Encounter Date: 05/03/2020   PT End of Session - 05/03/20 1330    Visit Number 5    Number of Visits 17    Date for PT Re-Evaluation 49/44/96   90 day cert for 8-wk POC   Authorization Type Medicare    Progress Note Due on Visit 10    PT Start Time 1234    PT Stop Time 1315    PT Time Calculation (min) 41 min    Equipment Utilized During Treatment Gait belt    Activity Tolerance Patient tolerated treatment well    Behavior During Therapy WFL for tasks assessed/performed           Past Medical History:  Diagnosis Date  . A-fib ([place])   . Anemia   . Asthma   . DM type 2 (diabetes mellitus, type 2) ([place]) 06/09/2019  . ESRD (end stage renal disease) on dialysis ([place])   . Essential hypertension 06/09/2019  . GIB (gastrointestinal bleeding)    Recurrent episodes- 09/2014, 09/2015 and 05/2016  . Gout   . History of recent blood transfusion 10/27/14   2 Units PRBC's  . Hyperkalemia 07/2011  . Multiple myeloma ([place])   . OSA on CPAP   . Pulmonary embolism ([place]) 07/2011; 09/27/2014   a. Bilat PE 07/2011 - unclear cause, tx with 6 months Coumadin.;   . Seizure disorder ([place]) 06/09/2019  . Sepsis ([place])   . Sickle cell-thalassemia disease ([place])    a. Sickle cell trait.  . Sleep apnea   . Stroke ([place]) 09/2015   R-MCA, L-MCA, PCA and bilateral cerebellar complicated by DVT/PE  . Subdural hematoma ([place]) 05/2019    Past Surgical History:  Procedure Laterality Date  . AV FISTULA PLACEMENT Right 12/05/2015   Procedure: INSERTION OF ARTERIOVENOUS (AV) GORE-TEX GRAFT ARM;  Surgeon: Elam Dutch, MD;  Location: [facility];  Service: Vascular;  Laterality: Right;  . BONE MARROW BIOPSY     . CATARACT EXTRACTION  08/2019  . CATARACT EXTRACTION  09/2019  . CHOLECYSTECTOMY    . CHOLECYSTECTOMY  1990's?  . COLONOSCOPY WITH PROPOFOL N/A 01/22/2017   Procedure: COLONOSCOPY WITH PROPOFOL;  Surgeon: Wilford Corner, MD;  Location: [facility];  Service: Endoscopy;  Laterality: N/A;  . EYE [name] Right   . INSERTION OF DIALYSIS CATHETER Right 10/16/2015   Procedure: INSERTION OF PALINDROME DIALYSIS CATHETER ;  Surgeon: Elam Dutch, MD;  Location: [facility];  Service: Vascular;  Laterality: Right;  . OTHER SURGICAL HISTORY     Retinal [name]  . PARS PLANA VITRECTOMY  02/17/2012   Procedure: PARS PLANA VITRECTOMY WITH 25 GAUGE;  Surgeon: Hayden Pedro, MD;  Location: [facility];  Service: Ophthalmology;  Laterality: Right;  . PERIPHERAL VASCULAR CATHETERIZATION N/A 03/20/2016   Procedure: A/V Shuntogram/Fistulagram;  Surgeon: Conrad Roper, MD;  Location: [facility];  Service: Cardiovascular;  Laterality: N/A;  . TEE WITHOUT CARDIOVERSION N/A 12/02/2016   Procedure: TRANSESOPHAGEAL ECHOCARDIOGRAM (TEE);  Surgeon: Larey Dresser, MD;  Location: [facility];  Service: Cardiovascular;  Laterality: N/A;    There were no vitals filed for this visit.   Subjective Assessment - 05/03/20 1238    Subjective Had Lasik [name] earlier this week and  already see better.  (no restrictions/precautions per patient report).  Last week, woke up with diarhhea, so I cancelled PT.    Pertinent History multiple myeloma, ESRD on dialysis, DM type II, DVT with IVC filter, CVA, COPD, a-fib; chemo treatments down to 1x/month (next is August 17th)    Patient Stated Goals To get back to walk without the walker, back to his baseline    Currently in Pain? No/denies              [facility] PT Assessment - 05/03/20 0001      Transfers   Transfers Sit to Stand;Stand to Sit    Sit to Stand 4: Min guard;With upper extremity assist;From chair/3-in-1;From bed    Stand to Sit 4: Min guard;4: Min assist;With  upper extremity assist;To bed;To chair/3-in-1;Uncontrolled descent      Ambulation/Gait   Ambulation/Gait Yes    Ambulation/Gait Assistance 4: Min guard    Ambulation/Gait Assistance Details Pt tends to veer to L, is able to self-correct with extra time    Ambulation Distance (Feet) 60 Feet   x 4   Assistive device Rolling walker    Gait Pattern Step-through pattern;Ataxic;Trunk flexed;Poor foot clearance - left;Poor foot clearance - right    Ambulation Surface Level;Indoor    Gait velocity 31.72 sec = 1.03 ft/sec      Berg Balance Test   Sit to Stand Able to stand  independently using hands    Standing Unsupported Able to stand 2 minutes with supervision    Sitting with Back Unsupported but Feet Supported on Floor or Stool Able to sit safely and securely 2 minutes    Stand to Sit Controls descent by using hands    Transfers Able to transfer with verbal cueing and /or supervision    Standing Unsupported with Eyes Closed Able to stand 10 seconds with supervision    Standing Unsupported with Feet Together Needs help to attain position but able to stand for 30 seconds with feet together    From Standing, Reach Forward with Outstretched Arm Reaches forward but needs supervision    From Standing Position, Pick up Object from Floor Unable to try/needs assist to keep balance   Recent eye [name] did not attempt   From Standing Position, Turn to Look Behind Over each Shoulder Needs supervision when turning    Turn 360 Degrees Needs assistance while turning    Standing Unsupported, Alternately Place Feet on Step/Stool Needs assistance to keep from falling or unable to try    Standing Unsupported, One Foot in [employer] balance while stepping or standing    Standing on One Leg Unable to try or needs assist to prevent fall    Total Score 21    Berg comment: Improved from 13/56      Timed Up and Go Test   TUG Normal TUG    Normal TUG (seconds) 44.03   41.25   TUG Comments Improved from 52.25  sec; used RW                         [facility] Adult PT Treatment/Exercise - 05/03/20 0001      High Level Balance   High Level Balance Comments Pt stands at counter, with varied foot position with UE support, x 5:30.  Pt performs lateral weigthshifting x 10 reps through hips, then weightshifting and reaching to counter, x 5 reps each side.  Then reviewed/pt performed standing portion of HEP:  tandem stance  x 10 seconds, each foot position with UE support, SLS x 10 seconds UE support, sidestepping along counter 2 reps R and L; heel/toe raises x 10 reps.  Also performed standing partial tandem stance x 10 seconds each position with UE support.           Additional short distance gait, 25 ft x 2 with RW and min guard assistance, extra time for turning to sit.       PT Education - 05/03/20 1330    Education Details Progress towards goals, POC    Person(s) Educated Patient    Methods Explanation    Comprehension Verbalized understanding            PT Short Term Goals - 05/03/20 1331      PT SHORT TERM GOAL #1   Title Pt will perform HEP independenlty for improved strength, balance and gait.  TARGET 04/27/2020    Baseline performs, but needs supervision for safety in standing    Time 4    Period Weeks    Status Partially Met      PT SHORT TERM GOAL #2   Title Pt will improve Berg Score to at least 23/56 for decreased fall risk.    Baseline 13/56 03/29/2020; 21/56 05/03/2020    Time 4    Period Weeks    Status Partially Met      PT SHORT TERM GOAL #3   Title Pt will improve TUG score to less than or equal to 40 seconds for decreased fall risk.    Baseline 52.25 sec 03/29/2020; 41.75 at best 05/03/2020    Time 4    Period Weeks    Status Partially Met      PT SHORT TERM GOAL #4   Title Pt will perform at least 3 minutes of standing with UE support and supervision for imrpoved standing tolerance for participation in ADLs.    Baseline 54 sec, 36 sec min guard/close  supervision; 5:30 standing 05/03/2020    Time 4    Period Weeks    Status Achieved      PT SHORT TERM GOAL #5   Title Pt will improve gait velocity to at least 1.3 ft/sec for improved gait efficiency and safety.    Baseline 1.03 ft/sec 05/03/2020    Time 4    Period Weeks    Status Not Met      PT SHORT TERM GOAL #6   Title Pt/wife will verbalize understanding of fall prevention in home environment.    Time 4    Period Weeks    Status On-going             PT Long Term Goals - 03/29/20 1348      PT LONG TERM GOAL #1   Title Pt will be independent with progression of HEP for improved strength, blaance, gait.  TARGET 05/25/2020    Time 8    Period Weeks    Status New      PT LONG TERM GOAL #2   Title Pt will imrpove Berg Balance score to at least 33/56 for decreased fall risk.    Time 8    Period Weeks    Status New      PT LONG TERM GOAL #3   Title Pt will improve TUG score to less than or equal to 30 seconds for decreased fall risk.    Time 8    Period Weeks    Status New  PT LONG TERM GOAL #4   Title Pt will improve gait velocity to at least 1.8 ft/sec for improved gait velocity and decreased fall risk.    Time 8    Period Weeks    Status New      PT LONG TERM GOAL #5   Title Pt will ascend/descent 12 steps with both handrails with supervision only, to negotiate steps at home.    Time 8    Period Weeks    Status New                 Plan - 05/03/20 1332    Clinical Impression Statement Pt has missed 2 previous appointments (due to sickness and due to having Lasik eye [name]), so pt not seen since 2 weeks ago.  Today is 5th visit, and PT short term goals assessed this visit.  STG 1, 2, 3 partially met, wtih pt demonstrating understanding of HEP, just needs supervision; pt also demo improvement in functional measures with Berg and TUG, just not to goal level.  STG 4 met for standing tolerance time.  STG 5 not met for gait velcoity.  STG 6 will be  addressed next visit.  Pt is making progress towards goals, despite missed several visits.  He remains at fall risk and will continue to benefit from skilled PT to further address stregnth, balance, gait for overall improved functional mobility and safety with gait.    Personal Factors and Comorbidities Comorbidity 3+    Comorbidities PMH:  multiple myeloma, ESRD on dialysis, DM type II, DVT with IVC filter, CVA, COPD, a-fib    Examination-Activity Limitations Locomotion Level;Transfers;Stairs;Stand    Examination-Participation Restrictions Community Activity    Stability/Clinical Decision Making Evolving/Moderate complexity    Rehab Potential Good    PT Frequency 2x / week    PT Duration 8 weeks   plus eval   PT Treatment/Interventions ADLs/Self Care Home Management;DME Instruction;Neuromuscular re-education;Balance training;Therapeutic exercise;Therapeutic activities;Functional mobility training;Stair training;Gait training;Patient/family education    PT Next Visit Plan Provide fall prevention and check STG 5.  Consider trying RW wheels on the outside for better stability of RW; postural strengthening, BLE strengthening/update HEP, balance and gait training; check about OT eval    PT Home Exercise Plan Access Code [email or web address]    Consulted and Agree with Plan of Care Patient           Patient will benefit from skilled therapeutic intervention in order to improve the following deficits and impairments:  Abnormal gait, Difficulty walking, Decreased safety awareness, Decreased balance, Decreased mobility, Decreased strength  Visit Diagnosis: Unsteadiness on feet  Other abnormalities of gait and mobility     Problem List Patient Active Problem List   Diagnosis Date Noted  . Colitis 03/15/2020  . AMS (altered mental status) 10/05/2019  . Acute metabolic encephalopathy 67/89/3810  . Headache, unspecified 09/28/2019  . Abnormality of gait 09/13/2019  . Diabetes mellitus with end-stage  renal disease ([place]) 08/07/2019  . Benign hypertensive kidney disease with chronic kidney disease 08/07/2019  . Hypothyroidism, unspecified 07/15/2019  . Spastic hemiparesis ([place]) 07/12/2019  . Dysphagia, oropharyngeal phase 07/05/2019  . Allergy, unspecified, initial encounter 06/27/2019  . PAF (paroxysmal atrial fibrillation) ([place])   . Bacteremia   . Labile blood pressure   . Labile blood glucose   . Controlled type 2 diabetes mellitus with hyperglycemia, without long-term current use of insulin ([place])   . ESRD on dialysis ([place])   . Right sided weakness   . SDH (  subdural hematoma) ([place])   . Chronic intracranial subdural hematoma ([facility]) 06/09/2019  . Multiple myeloma not having achieved remission ([place]) 06/09/2019  . ESRD on hemodialysis ([place]) 06/09/2019  . Essential hypertension 06/09/2019  . DM type 2 (diabetes mellitus, type 2) ([place]) 06/09/2019  . Seizure disorder ([place]) 06/09/2019  . Aspiration pneumonia ([place]) 06/09/2019  . History of bacteremia 06/09/2019  . Atrial fibrillation, chronic ([place]) 06/09/2019  . Bilateral kidney stones 05/31/2019  . Proteus infection 05/28/2019  . History of restrictive pulmonary disease 04/07/2019  . Multiple myeloma ([place]) 05/17/2018  . Goals of care, counseling/discussion 05/17/2018  . Partial idiopathic epilepsy with seizures of localized onset, intractable, without status epilepticus ([place]) 11/02/2017  . Iron deficiency anemia, unspecified 01/22/2017  . COPD (chronic obstructive pulmonary disease) ([place]) 11/11/2016  . CVA (cerebral vascular accident) ([place]) 11/11/2016  . GERD (gastroesophageal reflux disease) 11/11/2016  . GI bleed 11/11/2016  . 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  . Obesity (BMI 30-39.9) 11/11/2016  . Pulmonary hypertension ([place]) 11/11/2016  . Patent foramen ovale 11/11/2016  . Hyperparathyroidism, secondary renal ([place]) 11/11/2016  . VTE (venous thromboembolism) 11/11/2016  . Hypotension 11/11/2016  . Systemic  hypertension 11/11/2016  . Seizure ([place]) 11/11/2016  . Other seizures ([place]) 07/25/2016  . Encounter for removal of sutures 05/27/2016  . Ingrown nail 03/03/2016  . Onychomycosis due to dermatophyte 03/03/2016  . Other acquired hammer toe 03/03/2016  . Hemiparesis affecting right side as late effect of cerebrovascular accident ([place]) 02/28/2016  . Aphasia complicating stroke 54/36/0677  . Seizures ([place]) 01/08/2016  . History of ischemic left MCA stroke 01/08/2016  . Right sided weakness 01/08/2016  . Atrial fibrillation ([place]) 01/08/2016  . Leukocytosis 01/08/2016  . Benign essential HTN 01/08/2016  . Anemia of chronic disease 01/08/2016  . Controlled type 2 diabetes mellitus with diabetic nephropathy, without long-term current use of insulin ([place]) 01/08/2016  . History of DVT (deep vein thrombosis) 01/08/2016  . 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 Hypocalcemia 12/26/2015  . Long term (current) use of anticoagulants 11/07/2015  . Aftercare including intermittent dialysis ([place]) 11/02/2015  . Other specified coagulation defects ([place]) 11/02/2015  . Complication of vascular dialysis catheter 11/02/2015  . Diarrhea, unspecified 11/02/2015  . Fever, unspecified 11/02/2015  . Pruritus, unspecified 11/02/2015  . Shortness of breath 11/02/2015  . Type 2 diabetes mellitus with diabetic peripheral angiopathy without gangrene ([place]) 11/02/2015  . ESRD (end stage renal disease) on dialysis [facility])     Greggory Safranek W. 05/03/2020, 1:37 PM Frazier Butt., PT  [facility] [address] [place], [place], [address] Phone: [phone]   Fax:  [phone]  Name: Isaiah [name] MRN: [id number] Date of Birth: [birth year]

## 2020-05-04 DIAGNOSIS — D509 Iron deficiency anemia, unspecified: Secondary | ICD-10-CM | POA: Diagnosis not present

## 2020-05-04 DIAGNOSIS — Z992 Dependence on renal dialysis: Secondary | ICD-10-CM | POA: Diagnosis not present

## 2020-05-04 DIAGNOSIS — N2581 Secondary hyperparathyroidism of renal origin: Secondary | ICD-10-CM | POA: Diagnosis not present

## 2020-05-04 DIAGNOSIS — N186 End stage renal disease: Secondary | ICD-10-CM | POA: Diagnosis not present

## 2020-05-04 DIAGNOSIS — D631 Anemia in chronic kidney disease: Secondary | ICD-10-CM | POA: Diagnosis not present

## 2020-05-04 DIAGNOSIS — E1129 Type 2 diabetes mellitus with other diabetic kidney complication: Secondary | ICD-10-CM | POA: Diagnosis not present

## 2020-05-07 ENCOUNTER — Other Ambulatory Visit: Payer: Self-pay | Admitting: Internal Medicine

## 2020-05-07 DIAGNOSIS — D509 Iron deficiency anemia, unspecified: Secondary | ICD-10-CM | POA: Diagnosis not present

## 2020-05-07 DIAGNOSIS — N186 End stage renal disease: Secondary | ICD-10-CM | POA: Diagnosis not present

## 2020-05-07 DIAGNOSIS — N2581 Secondary hyperparathyroidism of renal origin: Secondary | ICD-10-CM | POA: Diagnosis not present

## 2020-05-07 DIAGNOSIS — E1129 Type 2 diabetes mellitus with other diabetic kidney complication: Secondary | ICD-10-CM | POA: Diagnosis not present

## 2020-05-07 DIAGNOSIS — D631 Anemia in chronic kidney disease: Secondary | ICD-10-CM | POA: Diagnosis not present

## 2020-05-07 DIAGNOSIS — Z992 Dependence on renal dialysis: Secondary | ICD-10-CM | POA: Diagnosis not present

## 2020-05-08 ENCOUNTER — Other Ambulatory Visit: Payer: Self-pay

## 2020-05-08 ENCOUNTER — Ambulatory Visit: Payer: Medicare Other | Admitting: Physical Therapy

## 2020-05-08 DIAGNOSIS — R2681 Unsteadiness on feet: Secondary | ICD-10-CM

## 2020-05-08 DIAGNOSIS — M6281 Muscle weakness (generalized): Secondary | ICD-10-CM

## 2020-05-08 DIAGNOSIS — R2689 Other abnormalities of gait and mobility: Secondary | ICD-10-CM

## 2020-05-08 MED FILL — MIDODRINE HCL 10 MG TABS: 10 | 30 days supply | Qty: 60 | Fill #0

## 2020-05-08 NOTE — Patient Instructions (Signed)
It is important to avoid accidents which may result in broken bones.  Here are a few ideas on how to make your home safer so you will be less likely to trip or fall.  1. Use nonskid mats or non slip strips in your shower or tub, on your bathroom floor and around sinks.  If you know that you have spilled water, wipe it up! 2. In the bathroom, it is important to have properly installed grab bars on the walls or on the edge of the tub.  Towel racks are NOT strong enough for you to hold onto or to pull on for support. 3. Stairs and hallways should have enough light.  Add lamps or night lights if you need ore light. 4. It is good to have handrails on both sides of the stairs if possible.  Always fix broken handrails right away. 5. It is important to see the edges of steps.  Paint the edges of outdoor steps white so you can see them better.  Put colored tape on the edge of inside steps. 6. Throw-rugs are dangerous because they can slide.  Removing the rugs is the best idea, but if they must stay, add adhesive carpet tape to prevent slipping. 7. Do not keep things on stairs or in the halls.  Remove small furniture that blocks the halls as it may cause you to trip.  Keep telephone and electrical cords out of the way where you walk. 8. Always were sturdy, rubber-soled shoes for good support.  Never wear just socks, especially on the stairs.  Socks may cause you to slip or fall.  Do not wear full-length housecoats as you can easily trip on the bottom.  9. Place the things you use the most on the shelves that are the easiest to reach.  If you use a stepstool, make sure it is in good condition.  If you feel unsteady, DO NOT climb, ask for help. 10. If a health professional advises you to use a cane or walker, do not be ashamed.  These items can keep you from falling and breaking your bones.

## 2020-05-08 NOTE — Therapy (Signed)
[facility] [address] [place], [place], [address] Phone: [phone]   Fax:  [phone]  Physical Therapy Treatment  Patient Details  Name: Isaiah [name] [facility] MRN: [id number] Date of Birth: 08/19/1966 Referring Provider (PT): Delice Lesch, MD   Encounter Date: 05/08/2020   PT End of Session - 05/08/20 1501    Visit Number 6    Number of Visits 17    Date for PT Re-Evaluation 91/79/15   90 day cert for 8-wk POC   Authorization Type Medicare    Progress Note Due on Visit 10    PT Start Time 1232    PT Stop Time 1316    PT Time Calculation (min) 44 min    Equipment Utilized During Treatment Gait belt    Activity Tolerance Patient tolerated treatment well    Behavior During Therapy [facility] for tasks assessed/performed           Past Medical History:  Diagnosis Date  . A-fib ([place])   . Anemia   . Asthma   . DM type 2 (diabetes mellitus, type 2) ([place]) 06/09/2019  . ESRD (end stage renal disease) on dialysis ([place])   . Essential hypertension 06/09/2019  . GIB (gastrointestinal bleeding)    Recurrent episodes- 09/2014, 09/2015 and 05/2016  . Gout   . History of recent blood transfusion 10/27/14   2 Units PRBC's  . Hyperkalemia 07/2011  . Multiple myeloma ([place])   . OSA on CPAP   . Pulmonary embolism ([place]) 07/2011; 09/27/2014   a. Bilat PE 07/2011 - unclear cause, tx with 6 months Coumadin.;   . Seizure disorder ([place]) 06/09/2019  . Sepsis ([place])   . Sickle cell-thalassemia disease ([place])    a. Sickle cell trait.  . Sleep apnea   . Stroke ([place]) 09/2015   R-MCA, L-MCA, PCA and bilateral cerebellar complicated by DVT/PE  . Subdural hematoma ([place]) 05/2019    Past Surgical History:  Procedure Laterality Date  . AV FISTULA PLACEMENT Right 12/05/2015   Procedure: INSERTION OF ARTERIOVENOUS (AV) GORE-TEX GRAFT ARM;  Surgeon: Elam Dutch, MD;  Location: [facility];  Service: Vascular;  Laterality: Right;  . BONE MARROW BIOPSY     . CATARACT EXTRACTION  08/2019  . CATARACT EXTRACTION  09/2019  . CHOLECYSTECTOMY    . CHOLECYSTECTOMY  1990's?  . COLONOSCOPY WITH PROPOFOL N/A 01/22/2017   Procedure: COLONOSCOPY WITH PROPOFOL;  Surgeon: Wilford Corner, MD;  Location: [facility];  Service: Endoscopy;  Laterality: N/A;  . EYE SURGERY Right   . INSERTION OF DIALYSIS CATHETER Right 10/16/2015   Procedure: INSERTION OF PALINDROME DIALYSIS CATHETER ;  Surgeon: Elam Dutch, MD;  Location: [facility];  Service: Vascular;  Laterality: Right;  . OTHER SURGICAL HISTORY     Retinal surgery  . PARS PLANA VITRECTOMY  02/17/2012   Procedure: PARS PLANA VITRECTOMY WITH 25 GAUGE;  Surgeon: Hayden Pedro, MD;  Location: [facility];  Service: Ophthalmology;  Laterality: Right;  . PERIPHERAL VASCULAR CATHETERIZATION N/A 03/20/2016   Procedure: A/V Shuntogram/Fistulagram;  Surgeon: Conrad Glen Flora, MD;  Location: [facility];  Service: Cardiovascular;  Laterality: N/A;  . TEE WITHOUT CARDIOVERSION N/A 12/02/2016   Procedure: TRANSESOPHAGEAL ECHOCARDIOGRAM (TEE);  Surgeon: Larey Dresser, MD;  Location: [facility];  Service: Cardiovascular;  Laterality: N/A;    There were no vitals filed for this visit.   Subjective Assessment - 05/08/20 1235    Subjective No problems from the eye surgery; have  eye drops through the end of the week.  No other changes.    Pertinent History multiple myeloma, ESRD on dialysis, DM type II, DVT with IVC filter, CVA, COPD, a-fib; chemo treatments down to 1x/month (next is August 17th)    Patient Stated Goals To get back to walk without the walker, back to his baseline    Currently in Pain? No/denies                             [facility] Adult PT Treatment/Exercise - 05/08/20 0001      Ambulation/Gait   Ambulation/Gait Yes    Ambulation/Gait Assistance 4: Min guard    Ambulation/Gait Assistance Details Pt veers to L, with extra time able to self-correct.  However, he does run into a chair  leg on the L.    Ambulation Distance (Feet) 110 Feet   x 2   Assistive device Rolling walker    Gait Pattern Step-through pattern;Ataxic;Trunk flexed;Poor foot clearance - left;Poor foot clearance - right    Ambulation Surface Level;Indoor      Self-Care   Self-Care Other Self-Care Comments    Other Self-Care Comments  Fall prevention education provided and discussed.      Knee/Hip Exercises: Standing   Functional Squat 1 set;10 reps    Functional Squat Limitations Cues for upright posture in standing      Knee/Hip Exercises: Seated   Long Arc Quad Strengthening;Right;Left;1 set;10 reps;Weights    Long Arc Quad Weight 2 lbs.    Long [employer] Limitations fatigues at end of set RLE    Marching Strengthening;Right;Left;3 sets;10 reps;Weights    Marching Limitations Started no weight, then added 1#, then 2#, 10 reps each               Balance Exercises - 05/08/20 0001      Balance Exercises: Standing   SLS with Vectors Solid surface;Upper extremity assist 2;Other reps (comment);Other (comment)   Alt step taps to 6" step in parallel bars, 10 reps   Standing, One Foot on a Step Eyes open;5 reps   Alt UE lifts, 5 reps x 2 sets, cues for glut activation RLE   Partial Tandem Stance Eyes open;Upper extremity support 1;3 reps;10 secs    Retro Gait Upper extremity support;2 reps   in parallel bars, cues for step length, posture   Marching Solid surface;Upper extremity assist 2;Forwards   Forward march in parallel bars, 2 reps   Other Standing Exercises Sidestep and weightshift to R side, x 10 reps, then to L side, 10 reps.  Sidestep R<>L x 10 reps, with UE support at parallel bars.             PT Education - 05/08/20 1500    Education Details Fall prevention education; discussed possibility of OT eval for RUE weakness; pt in agreement to request OT order    Person(s) Educated Patient    Methods Explanation;Handout    Comprehension Verbalized understanding            PT Short  Term Goals - 05/08/20 1502      PT SHORT TERM GOAL #1   Title Pt will perform HEP independenlty for improved strength, balance and gait.  TARGET 04/27/2020    Baseline performs, but needs supervision for safety in standing    Time 4    Period Weeks    Status Partially Met      PT SHORT TERM GOAL #  2   Title Pt will improve Berg Score to at least 23/56 for decreased fall risk.    Baseline 13/56 03/29/2020; 21/56 05/03/2020    Time 4    Period Weeks    Status Partially Met      PT SHORT TERM GOAL #3   Title Pt will improve TUG score to less than or equal to 40 seconds for decreased fall risk.    Baseline 52.25 sec 03/29/2020; 41.75 at best 05/03/2020    Time 4    Period Weeks    Status Partially Met      PT SHORT TERM GOAL #4   Title Pt will perform at least 3 minutes of standing with UE support and supervision for imrpoved standing tolerance for participation in ADLs.    Baseline 54 sec, 36 sec min guard/close supervision; 5:30 standing 05/03/2020    Time 4    Period Weeks    Status Achieved      PT SHORT TERM GOAL #5   Title Pt will improve gait velocity to at least 1.3 ft/sec for improved gait efficiency and safety.    Baseline 1.03 ft/sec 05/03/2020    Time 4    Period Weeks    Status Not Met      PT SHORT TERM GOAL #6   Title Pt/wife will verbalize understanding of fall prevention in home environment.    Time 4    Period Weeks    Status Achieved             PT Long Term Goals - 03/29/20 1348      PT LONG TERM GOAL #1   Title Pt will be independent with progression of HEP for improved strength, blaance, gait.  TARGET 05/25/2020    Time 8    Period Weeks    Status New      PT LONG TERM GOAL #2   Title Pt will imrpove Berg Balance score to at least 33/56 for decreased fall risk.    Time 8    Period Weeks    Status New      PT LONG TERM GOAL #3   Title Pt will improve TUG score to less than or equal to 30 seconds for decreased fall risk.    Time 8    Period  Weeks    Status New      PT LONG TERM GOAL #4   Title Pt will improve gait velocity to at least 1.8 ft/sec for improved gait velocity and decreased fall risk.    Time 8    Period Weeks    Status New      PT LONG TERM GOAL #5   Title Pt will ascend/descent 12 steps with both handrails with supervision only, to negotiate steps at home.    Time 8    Period Weeks    Status New                 Plan - 05/08/20 1502    Clinical Impression Statement STG 6 met with discussion and verbalizing understanding of fall prevention in home environment.  Focused skilled PT session today on strengthening and standing balance exercises, working on lessening UE support.  Pt is able to progress to performing some standing balance exercises with 1 UE support with min guard support.  He will continue to beneift from skilled PT to address strength, balance, and gait training towards improved functional independence and decreased fall risk.    Personal Factors and  Comorbidities Comorbidity 3+    Comorbidities PMH:  multiple myeloma, ESRD on dialysis, DM type II, DVT with IVC filter, CVA, COPD, a-fib    Examination-Activity Limitations Locomotion Level;Transfers;Stairs;Stand    Examination-Participation Restrictions Community Activity    Stability/Clinical Decision Making Evolving/Moderate complexity    Rehab Potential Good    PT Frequency 2x / week    PT Duration 8 weeks   plus eval   PT Treatment/Interventions ADLs/Self Care Home Management;DME Instruction;Neuromuscular re-education;Balance training;Therapeutic exercise;Therapeutic activities;Functional mobility training;Stair training;Gait training;Patient/family education    PT Next Visit Plan Postural strengthening, BLE strengthening/update HEP, balance and gait training; try step ups, minisquats>up on toes    PT Home Exercise Plan Access Code [email or web address]    Consulted and Agree with Plan of Care Patient           Patient will benefit from skilled  therapeutic intervention in order to improve the following deficits and impairments:  Abnormal gait, Difficulty walking, Decreased safety awareness, Decreased balance, Decreased mobility, Decreased strength  Visit Diagnosis: Unsteadiness on feet  Muscle weakness (generalized)  Other abnormalities of gait and mobility     Problem List Patient Active Problem List   Diagnosis Date Noted  . Colitis 03/15/2020  . AMS (altered mental status) 10/05/2019  . Acute metabolic encephalopathy 55/97/4163  . Headache, unspecified 09/28/2019  . Abnormality of gait 09/13/2019  . Diabetes mellitus with end-stage renal disease ([place]) 08/07/2019  . Benign hypertensive kidney disease with chronic kidney disease 08/07/2019  . Hypothyroidism, unspecified 07/15/2019  . Spastic hemiparesis ([place]) 07/12/2019  . Dysphagia, oropharyngeal phase 07/05/2019  . Allergy, unspecified, initial encounter 06/27/2019  . PAF (paroxysmal atrial fibrillation) ([place])   . Bacteremia   . Labile blood pressure   . Labile blood glucose   . Controlled type 2 diabetes mellitus with hyperglycemia, without long-term current use of insulin ([place])   . ESRD on dialysis ([place])   . Right sided weakness   . SDH (subdural hematoma) ([place])   . Chronic intracranial subdural hematoma ([facility]) 06/09/2019  . Multiple myeloma not having achieved remission ([place]) 06/09/2019  . ESRD on hemodialysis ([place]) 06/09/2019  . Essential hypertension 06/09/2019  . DM type 2 (diabetes mellitus, type 2) ([place]) 06/09/2019  . Seizure disorder ([place]) 06/09/2019  . Aspiration pneumonia ([place]) 06/09/2019  . History of bacteremia 06/09/2019  . Atrial fibrillation, chronic ([place]) 06/09/2019  . Bilateral kidney stones 05/31/2019  . Proteus infection 05/28/2019  . History of restrictive pulmonary disease 04/07/2019  . Multiple myeloma ([place]) 05/17/2018  . Goals of care, counseling/discussion 05/17/2018  . Partial idiopathic epilepsy with seizures of localized onset,  intractable, without status epilepticus ([place]) 11/02/2017  . Iron deficiency anemia, unspecified 01/22/2017  . COPD (chronic obstructive pulmonary disease) ([place]) 11/11/2016  . CVA (cerebral vascular accident) ([place]) 11/11/2016  . GERD (gastroesophageal reflux disease) 11/11/2016  . GI bleed 11/11/2016  . 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  . Obesity (BMI 30-39.9) 11/11/2016  . Pulmonary hypertension ([place]) 11/11/2016  . Patent foramen ovale 11/11/2016  . Hyperparathyroidism, secondary renal ([place]) 11/11/2016  . VTE (venous thromboembolism) 11/11/2016  . Hypotension 11/11/2016  . Systemic hypertension 11/11/2016  . Seizure ([place]) 11/11/2016  . Other seizures ([place]) 07/25/2016  . Encounter for removal of sutures 05/27/2016  . Ingrown nail 03/03/2016  . Onychomycosis due to dermatophyte 03/03/2016  . Other acquired hammer toe 03/03/2016  . Hemiparesis affecting right side as late effect of cerebrovascular accident ([place]) 02/28/2016  . Aphasia complicating stroke 84/53/6468  . Seizures ([place])  01/08/2016  . History of ischemic left MCA stroke 01/08/2016  . Right sided weakness 01/08/2016  . Atrial fibrillation ([place]) 01/08/2016  . Leukocytosis 01/08/2016  . Benign essential HTN 01/08/2016  . Anemia of chronic disease 01/08/2016  . Controlled type 2 diabetes mellitus with diabetic nephropathy, without long-term current use of insulin ([place]) 01/08/2016  . History of DVT (deep vein thrombosis) 01/08/2016  . 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 Hypocalcemia 12/26/2015  . Long term (current) use of anticoagulants 11/07/2015  . Aftercare including intermittent dialysis ([place]) 11/02/2015  . Other specified coagulation defects ([place]) 11/02/2015  . Complication of vascular dialysis catheter 11/02/2015  . Diarrhea, unspecified 11/02/2015  . Fever, unspecified 11/02/2015  . Pruritus, unspecified 11/02/2015  . Shortness of breath 11/02/2015  . Type 2 diabetes mellitus with diabetic  peripheral angiopathy without gangrene ([place]) 11/02/2015  . ESRD (end stage renal disease) on dialysis [facility])     , W. 05/08/2020, 3:08 PM  Frazier Butt., PT   [facility] [address] [place], [place], [address] Phone: [phone]   Fax:  [phone]  Name: Deveion Denz [facility] MRN: [id number] Date of Birth: 11/12/1965

## 2020-05-09 DIAGNOSIS — E1122 Type 2 diabetes mellitus with diabetic chronic kidney disease: Secondary | ICD-10-CM | POA: Diagnosis not present

## 2020-05-09 DIAGNOSIS — M6281 Muscle weakness (generalized): Secondary | ICD-10-CM | POA: Diagnosis not present

## 2020-05-09 DIAGNOSIS — N2581 Secondary hyperparathyroidism of renal origin: Secondary | ICD-10-CM | POA: Diagnosis not present

## 2020-05-09 DIAGNOSIS — R2689 Other abnormalities of gait and mobility: Secondary | ICD-10-CM | POA: Diagnosis not present

## 2020-05-09 DIAGNOSIS — R2681 Unsteadiness on feet: Secondary | ICD-10-CM | POA: Diagnosis not present

## 2020-05-09 DIAGNOSIS — D509 Iron deficiency anemia, unspecified: Secondary | ICD-10-CM | POA: Diagnosis not present

## 2020-05-09 DIAGNOSIS — E1129 Type 2 diabetes mellitus with other diabetic kidney complication: Secondary | ICD-10-CM | POA: Diagnosis not present

## 2020-05-09 DIAGNOSIS — Z992 Dependence on renal dialysis: Secondary | ICD-10-CM | POA: Diagnosis not present

## 2020-05-09 DIAGNOSIS — S065X9A Traumatic subdural hemorrhage with loss of consciousness of unspecified duration, initial encounter: Secondary | ICD-10-CM | POA: Diagnosis not present

## 2020-05-09 DIAGNOSIS — N186 End stage renal disease: Secondary | ICD-10-CM | POA: Diagnosis not present

## 2020-05-09 DIAGNOSIS — Z23 Encounter for immunization: Secondary | ICD-10-CM | POA: Diagnosis not present

## 2020-05-10 ENCOUNTER — Ambulatory Visit: Payer: Medicare Other | Attending: Physical Medicine & Rehabilitation | Admitting: Physical Therapy

## 2020-05-10 ENCOUNTER — Other Ambulatory Visit: Payer: Self-pay

## 2020-05-10 DIAGNOSIS — S065X9A Traumatic subdural hemorrhage with loss of consciousness of unspecified duration, initial encounter: Secondary | ICD-10-CM | POA: Insufficient documentation

## 2020-05-10 DIAGNOSIS — M6281 Muscle weakness (generalized): Secondary | ICD-10-CM

## 2020-05-10 DIAGNOSIS — R2681 Unsteadiness on feet: Secondary | ICD-10-CM | POA: Insufficient documentation

## 2020-05-10 DIAGNOSIS — R2689 Other abnormalities of gait and mobility: Secondary | ICD-10-CM | POA: Insufficient documentation

## 2020-05-10 NOTE — Therapy (Signed)
[facility] [address] [place], [place], [address] Phone: [phone]   Fax:  [phone]  Physical Therapy Treatment  Patient Details  Name: Isaiah [name] [facility] MRN: [id number] Date of Birth: 15-Aug-1966 Referring Provider (PT): Delice Lesch, MD   Encounter Date: 05/10/2020   PT End of Session - 05/10/20 1737    Visit Number 7    Number of Visits 17    Date for PT Re-Evaluation 62/03/55   90 day cert for 8-wk POC   Authorization Type Medicare    Progress Note Due on Visit 10    PT Start Time 1231    PT Stop Time 1314    PT Time Calculation (min) 43 min    Equipment Utilized During Treatment Gait belt    Activity Tolerance Patient tolerated treatment well;Patient limited by lethargy   Pt appears fatigued today   Behavior During Therapy WFL for tasks assessed/performed;Flat affect           Past Medical History:  Diagnosis Date  . A-fib ([place])   . Anemia   . Asthma   . DM type 2 (diabetes mellitus, type 2) ([place]) 06/09/2019  . ESRD (end stage renal disease) on dialysis ([place])   . Essential hypertension 06/09/2019  . GIB (gastrointestinal bleeding)    Recurrent episodes- 09/2014, 09/2015 and 05/2016  . Gout   . History of recent blood transfusion 10/27/14   2 Units PRBC's  . Hyperkalemia 07/2011  . Multiple myeloma ([place])   . OSA on CPAP   . Pulmonary embolism ([place]) 07/2011; 09/27/2014   a. Bilat PE 07/2011 - unclear cause, tx with 6 months Coumadin.;   . Seizure disorder ([place]) 06/09/2019  . Sepsis ([place])   . Sickle cell-thalassemia disease ([place])    a. Sickle cell trait.  . Sleep apnea   . Stroke ([place]) 09/2015   R-MCA, L-MCA, PCA and bilateral cerebellar complicated by DVT/PE  . Subdural hematoma ([place]) 05/2019    Past Surgical History:  Procedure Laterality Date  . AV FISTULA PLACEMENT Right 12/05/2015   Procedure: INSERTION OF ARTERIOVENOUS (AV) GORE-TEX GRAFT ARM;  Surgeon: Elam Dutch, MD;  Location: [facility];  Service: Vascular;  Laterality: Right;  . BONE MARROW BIOPSY    . CATARACT EXTRACTION  08/2019  . CATARACT EXTRACTION  09/2019  . CHOLECYSTECTOMY    . CHOLECYSTECTOMY  1990's?  . COLONOSCOPY WITH PROPOFOL N/A 01/22/2017   Procedure: COLONOSCOPY WITH PROPOFOL;  Surgeon: Wilford Corner, MD;  [facility];  Service: Endoscopy;  Laterality: N/A;  . EYE SURGERY Right   . INSERTION OF DIALYSIS CATHETER Right 10/16/2015   Procedure: INSERTION OF PALINDROME DIALYSIS CATHETER ;  Surgeon: Elam Dutch, MD;  Location: [facility];  Service: Vascular;  Laterality: Right;  . OTHER SURGICAL HISTORY     Retinal surgery  . PARS PLANA VITRECTOMY  02/17/2012   Procedure: PARS PLANA VITRECTOMY WITH 25 GAUGE;  Surgeon: Hayden Pedro, MD;  Location: [place] [facility] [place];  Service: Ophthalmology;  Laterality: Right;  . PERIPHERAL VASCULAR CATHETERIZATION N/A 03/20/2016   Procedure: A/V Shuntogram/Fistulagram;  Surgeon: Conrad Drummond, MD;  Location: [facility];  Service: Cardiovascular;  Laterality: N/A;  . TEE WITHOUT CARDIOVERSION N/A 12/02/2016   Procedure: TRANSESOPHAGEAL ECHOCARDIOGRAM (TEE);  Surgeon: Larey Dresser, MD;  Location: [facility];  Service: Cardiovascular;  Laterality: N/A;    There were no vitals filed for this visit.   Subjective Assessment - 05/10/20 1233  Subjective No changes, nothing new.    Pertinent History multiple myeloma, ESRD on dialysis, DM type II, DVT with IVC filter, CVA, COPD, a-fib; chemo treatments down to 1x/month (next is August 17th)    Patient Stated Goals To get back to walk without the walker, back to his baseline    Currently in Pain? No/denies                             [facility] Adult PT Treatment/Exercise - 05/10/20 0001      Ambulation/Gait   Ambulation/Gait Yes    Ambulation/Gait Assistance 4: Min guard    Ambulation/Gait Assistance Details Coming into clinic, pt able to maintain straight path with RW; later gait in session, pt  veers more strongly towards L and needs extra time to correct.    Ambulation Distance (Feet) 100 Feet   x 2, 40 ft x 2   Assistive device Rolling walker    Gait Pattern Step-through pattern;Ataxic;Trunk flexed;Poor foot clearance - left;Poor foot clearance - right    Ambulation Surface Level;Indoor      Knee/Hip Exercises: Standing   Forward Step Up Right;Left;1 set;5 reps;Hand Hold: 2;Step Height: 6"    Forward Step Up Limitations step up/up, down/down with max cues for technique    Functional Squat 2 sets;1 set;10 reps    Functional Squat Limitations Cues for upright posture in standing; 2nd set with squats then up on toes.  Pt appears fatigued after and requires sitting break.  HR 122/80               Balance Exercises - 05/10/20 0001      Balance Exercises: Standing   Standing Eyes Opened Wide (BOA);Narrow base of support (BOS);Solid surface;Head turns;5 reps;Limitations   Head nods   Standing Eyes Opened Limitations Standing in corner, walker in front, PT provides consistent min guard    Standing Eyes Closed Wide (BOA);Narrow base of support (BOS);Solid surface;3 reps;10 secs;Limitations    Standing Eyes Closed Limitations In corner, RW for support, min guard assist provided throughout    Partial Tandem Stance Eyes open;Upper extremity support 1;3 reps;10 secs   Min guard assist provided throughout   Marching Solid surface;Upper extremity assist 2;Static;10 reps    Other Standing Exercises Standing with mirror in front for visual cues, holding to counter and chair:  forward step over obstacle x 10 reps each side, then kicking bean bags, 5 reps x 3 sets.  Facing counter, side step and weigthshift/return to midline stepping over obstacle, x 10 reps.  Cues for improved foot clearance out and in to avoid obstacle.    Other Standing Exercises Comments Pt requires several seated rest breaks during gait.               PT Short Term Goals - 05/08/20 1502      PT SHORT TERM GOAL  #1   Title Pt will perform HEP independenlty for improved strength, balance and gait.  TARGET 04/27/2020    Baseline performs, but needs supervision for safety in standing    Time 4    Period Weeks    Status Partially Met      PT SHORT TERM GOAL #2   Title Pt will improve Berg Score to at least 23/56 for decreased fall risk.    Baseline 13/56 03/29/2020; 21/56 05/03/2020    Time 4    Period Weeks    Status Partially Met  PT SHORT TERM GOAL #3   Title Pt will improve TUG score to less than or equal to 40 seconds for decreased fall risk.    Baseline 52.25 sec 03/29/2020; 41.75 at best 05/03/2020    Time 4    Period Weeks    Status Partially Met      PT SHORT TERM GOAL #4   Title Pt will perform at least 3 minutes of standing with UE support and supervision for imrpoved standing tolerance for participation in ADLs.    Baseline 54 sec, 36 sec min guard/close supervision; 5:30 standing 05/03/2020    Time 4    Period Weeks    Status Achieved      PT SHORT TERM GOAL #5   Title Pt will improve gait velocity to at least 1.3 ft/sec for improved gait efficiency and safety.    Baseline 1.03 ft/sec 05/03/2020    Time 4    Period Weeks    Status Not Met      PT SHORT TERM GOAL #6   Title Pt/wife will verbalize understanding of fall prevention in home environment.    Time 4    Period Weeks    Status Achieved             PT Long Term Goals - 03/29/20 1348      PT LONG TERM GOAL #1   Title Pt will be independent with progression of HEP for improved strength, blaance, gait.  TARGET 05/25/2020    Time 8    Period Weeks    Status New      PT LONG TERM GOAL #2   Title Pt will imrpove Berg Balance score to at least 33/56 for decreased fall risk.    Time 8    Period Weeks    Status New      PT LONG TERM GOAL #3   Title Pt will improve TUG score to less than or equal to 30 seconds for decreased fall risk.    Time 8    Period Weeks    Status New      PT LONG TERM GOAL #4   Title  Pt will improve gait velocity to at least 1.8 ft/sec for improved gait velocity and decreased fall risk.    Time 8    Period Weeks    Status New      PT LONG TERM GOAL #5   Title Pt will ascend/descent 12 steps with both handrails with supervision only, to negotiate steps at home.    Time 8    Period Weeks    Status New                 Plan - 05/10/20 1738    Clinical Impression Statement Skilled PT session focused on strength, gait trianing and standing balance activities.  Pt generally appears more fatigued this visit, increased trunk sway and unsteadiness with balance activities, though vitals stable in session.  He will continue to benefit from skilled PT to further address strength, balance, gait training for improved overall mobility.    Personal Factors and Comorbidities Comorbidity 3+    Comorbidities PMH:  multiple myeloma, ESRD on dialysis, DM type II, DVT with IVC filter, CVA, COPD, a-fib    Examination-Activity Limitations Locomotion Level;Transfers;Stairs;Stand    Examination-Participation Restrictions Community Activity    Stability/Clinical Decision Making Evolving/Moderate complexity    Rehab Potential Good    PT Frequency 2x / week    PT Duration 8 weeks  plus eval   PT Treatment/Interventions ADLs/Self Care Home Management;DME Instruction;Neuromuscular re-education;Balance training;Therapeutic exercise;Therapeutic activities;Functional mobility training;Stair training;Gait training;Patient/family education    PT Next Visit Plan Postural strengthening, BLE strengthening/update HEP, balance and gait training; try step ups    PT Home Exercise Plan Access Code [email or web address]    Consulted and Agree with Plan of Care Patient           Patient will benefit from skilled therapeutic intervention in order to improve the following deficits and impairments:  Abnormal gait, Difficulty walking, Decreased safety awareness, Decreased balance, Decreased mobility, Decreased  strength  Visit Diagnosis: Muscle weakness (generalized)  Unsteadiness on feet  Other abnormalities of gait and mobility     Problem List Patient Active Problem List   Diagnosis Date Noted  . Colitis 03/15/2020  . AMS (altered mental status) 10/05/2019  . Acute metabolic encephalopathy 64/15/8309  . Headache, unspecified 09/28/2019  . Abnormality of gait 09/13/2019  . Diabetes mellitus with end-stage renal disease ([place]) 08/07/2019  . Benign hypertensive kidney disease with chronic kidney disease 08/07/2019  . Hypothyroidism, unspecified 07/15/2019  . Spastic hemiparesis ([place]) 07/12/2019  . Dysphagia, oropharyngeal phase 07/05/2019  . Allergy, unspecified, initial encounter 06/27/2019  . PAF (paroxysmal atrial fibrillation) ([place])   . Bacteremia   . Labile blood pressure   . Labile blood glucose   . Controlled type 2 diabetes mellitus with hyperglycemia, without long-term current use of insulin ([place])   . ESRD on dialysis ([place])   . Right sided weakness   . SDH (subdural hematoma) ([place])   . Chronic intracranial subdural hematoma ([facility]) 06/09/2019  . Multiple myeloma not having achieved remission ([place]) 06/09/2019  . ESRD on hemodialysis ([place]) 06/09/2019  . Essential hypertension 06/09/2019  . DM type 2 (diabetes mellitus, type 2) ([place]) 06/09/2019  . Seizure disorder ([place]) 06/09/2019  . Aspiration pneumonia ([place]) 06/09/2019  . History of bacteremia 06/09/2019  . Atrial fibrillation, chronic ([place]) 06/09/2019  . Bilateral kidney stones 05/31/2019  . Proteus infection 05/28/2019  . History of restrictive pulmonary disease 04/07/2019  . Multiple myeloma ([place]) 05/17/2018  . Goals of care, counseling/discussion 05/17/2018  . Partial idiopathic epilepsy with seizures of localized onset, intractable, without status epilepticus ([place]) 11/02/2017  . Iron deficiency anemia, unspecified 01/22/2017  . COPD (chronic obstructive pulmonary disease) ([place]) 11/11/2016  . CVA (cerebral  vascular accident) ([place]) 11/11/2016  . GERD (gastroesophageal reflux disease) 11/11/2016  . GI bleed 11/11/2016  . 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  . Obesity (BMI 30-39.9) 11/11/2016  . Pulmonary hypertension ([place]) 11/11/2016  . Patent foramen ovale 11/11/2016  . Hyperparathyroidism, secondary renal ([place]) 11/11/2016  . VTE (venous thromboembolism) 11/11/2016  . Hypotension 11/11/2016  . Systemic hypertension 11/11/2016  . Seizure ([place]) 11/11/2016  . Other seizures ([place]) 07/25/2016  . Encounter for removal of sutures 05/27/2016  . Ingrown nail 03/03/2016  . Onychomycosis due to dermatophyte 03/03/2016  . Other acquired hammer toe 03/03/2016  . Hemiparesis affecting right side as late effect of cerebrovascular accident ([place]) 02/28/2016  . Aphasia complicating stroke 40/76/8088  . Seizures ([place]) 01/08/2016  . History of ischemic left MCA stroke 01/08/2016  . Right sided weakness 01/08/2016  . Atrial fibrillation ([place]) 01/08/2016  . Leukocytosis 01/08/2016  . Benign essential HTN 01/08/2016  . Anemia of chronic disease 01/08/2016  . Controlled type 2 diabetes mellitus with diabetic nephropathy, without long-term current use of insulin ([place]) 01/08/2016  . History of DVT (deep vein thrombosis) 01/08/2016  . 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  Hypocalcemia 12/26/2015  . Long term (current) use of anticoagulants 11/07/2015  . Aftercare including intermittent dialysis ([place]) 11/02/2015  . Other specified coagulation defects ([place]) 11/02/2015  . Complication of vascular dialysis catheter 11/02/2015  . Diarrhea, unspecified 11/02/2015  . Fever, unspecified 11/02/2015  . Pruritus, unspecified 11/02/2015  . Shortness of breath 11/02/2015  . Type 2 diabetes mellitus with diabetic peripheral angiopathy without gangrene ([place]) 11/02/2015  . ESRD (end stage renal disease) on dialysis [facility])     Austine Kelsay W. 05/10/2020, 5:42 PM Frazier Butt., PT  [facility] [address] [place], [place], [address] Phone: [phone]   Fax:  [phone]  Name: Herley Bernardini [name] MRN: [id number] Date of Birth: Jan 10, 1966

## 2020-05-11 DIAGNOSIS — D509 Iron deficiency anemia, unspecified: Secondary | ICD-10-CM | POA: Diagnosis not present

## 2020-05-11 DIAGNOSIS — N2581 Secondary hyperparathyroidism of renal origin: Secondary | ICD-10-CM | POA: Diagnosis not present

## 2020-05-11 DIAGNOSIS — E1129 Type 2 diabetes mellitus with other diabetic kidney complication: Secondary | ICD-10-CM | POA: Diagnosis not present

## 2020-05-11 DIAGNOSIS — Z23 Encounter for immunization: Secondary | ICD-10-CM | POA: Diagnosis not present

## 2020-05-11 DIAGNOSIS — N186 End stage renal disease: Secondary | ICD-10-CM | POA: Diagnosis not present

## 2020-05-11 DIAGNOSIS — Z992 Dependence on renal dialysis: Secondary | ICD-10-CM | POA: Diagnosis not present

## 2020-05-14 DIAGNOSIS — R269 Unspecified abnormalities of gait and mobility: Secondary | ICD-10-CM | POA: Diagnosis not present

## 2020-05-14 DIAGNOSIS — N2581 Secondary hyperparathyroidism of renal origin: Secondary | ICD-10-CM | POA: Diagnosis not present

## 2020-05-14 DIAGNOSIS — N186 End stage renal disease: Secondary | ICD-10-CM | POA: Diagnosis not present

## 2020-05-14 DIAGNOSIS — R2689 Other abnormalities of gait and mobility: Secondary | ICD-10-CM | POA: Diagnosis not present

## 2020-05-14 DIAGNOSIS — G40909 Epilepsy, unspecified, not intractable, without status epilepticus: Secondary | ICD-10-CM | POA: Diagnosis not present

## 2020-05-14 DIAGNOSIS — G8110 Spastic hemiplegia affecting unspecified side: Secondary | ICD-10-CM | POA: Diagnosis not present

## 2020-05-14 DIAGNOSIS — Z992 Dependence on renal dialysis: Secondary | ICD-10-CM | POA: Diagnosis not present

## 2020-05-14 DIAGNOSIS — F445 Conversion disorder with seizures or convulsions: Secondary | ICD-10-CM | POA: Diagnosis not present

## 2020-05-14 DIAGNOSIS — E1129 Type 2 diabetes mellitus with other diabetic kidney complication: Secondary | ICD-10-CM | POA: Diagnosis not present

## 2020-05-14 DIAGNOSIS — Z23 Encounter for immunization: Secondary | ICD-10-CM | POA: Diagnosis not present

## 2020-05-14 DIAGNOSIS — D509 Iron deficiency anemia, unspecified: Secondary | ICD-10-CM | POA: Diagnosis not present

## 2020-05-15 ENCOUNTER — Ambulatory Visit: Payer: Medicare Other | Admitting: Physical Therapy

## 2020-05-15 ENCOUNTER — Other Ambulatory Visit: Payer: Self-pay | Admitting: Internal Medicine

## 2020-05-15 ENCOUNTER — Other Ambulatory Visit: Payer: Self-pay

## 2020-05-15 ENCOUNTER — Other Ambulatory Visit: Payer: Self-pay | Admitting: Oncology

## 2020-05-15 ENCOUNTER — Encounter: Payer: Self-pay | Admitting: Physical Therapy

## 2020-05-15 ENCOUNTER — Encounter: Payer: Medicare Other | Attending: Physical Medicine & Rehabilitation | Admitting: Physical Medicine & Rehabilitation

## 2020-05-15 ENCOUNTER — Encounter: Payer: Self-pay | Admitting: Physical Medicine & Rehabilitation

## 2020-05-15 VITALS — BP 156/85 | HR 73 | Temp 98.5°F | Ht 70.0 in | Wt 184.6 lb

## 2020-05-15 DIAGNOSIS — R269 Unspecified abnormalities of gait and mobility: Secondary | ICD-10-CM | POA: Insufficient documentation

## 2020-05-15 DIAGNOSIS — Z992 Dependence on renal dialysis: Secondary | ICD-10-CM | POA: Diagnosis not present

## 2020-05-15 DIAGNOSIS — M6281 Muscle weakness (generalized): Secondary | ICD-10-CM

## 2020-05-15 DIAGNOSIS — G40909 Epilepsy, unspecified, not intractable, without status epilepticus: Secondary | ICD-10-CM | POA: Diagnosis not present

## 2020-05-15 DIAGNOSIS — R2681 Unsteadiness on feet: Secondary | ICD-10-CM

## 2020-05-15 DIAGNOSIS — S065XAA Traumatic subdural hemorrhage with loss of consciousness status unknown, initial encounter: Secondary | ICD-10-CM

## 2020-05-15 DIAGNOSIS — G8110 Spastic hemiplegia affecting unspecified side: Secondary | ICD-10-CM | POA: Diagnosis not present

## 2020-05-15 DIAGNOSIS — R2689 Other abnormalities of gait and mobility: Secondary | ICD-10-CM

## 2020-05-15 DIAGNOSIS — N186 End stage renal disease: Secondary | ICD-10-CM | POA: Diagnosis not present

## 2020-05-15 DIAGNOSIS — R7881 Bacteremia: Secondary | ICD-10-CM | POA: Insufficient documentation

## 2020-05-15 DIAGNOSIS — S065X9A Traumatic subdural hemorrhage with loss of consciousness of unspecified duration, initial encounter: Secondary | ICD-10-CM | POA: Insufficient documentation

## 2020-05-15 MED FILL — ACYCLOVIR 400 MG TABS: 400 | 90 days supply | Qty: 90 | Fill #0

## 2020-05-15 MED FILL — PANTOPRAZOLE SOD DR 40 MG T: 40 | 90 days supply | Qty: 90 | Fill #0

## 2020-05-15 MED FILL — PROCHLORPERAZINE 10 MG TAB: 10 | 7 days supply | Qty: 30 | Fill #0

## 2020-05-15 MED FILL — SUBVENITE 200 MG TABS: 200 | 30 days supply | Qty: 60 | Fill #7

## 2020-05-15 NOTE — Progress Notes (Signed)
Subjective:    Patient ID: [place], male    DOB: [birth year], 54 y.o.   MRN: [id number]  HPI [place] history of T2DM, A fib, ESRD-HD MWF, CVA, DVT/PE s/p IVC filter, seizure disorder, MM with recent admission to [facility] for stem cell transplant but procedure aborted due to findings of proteus mirabilis bacteremia, aspiration PNA, acute on chronic SDH, findings of bilateral renal calculi as well as breakthrough seizures presents for follow up for SDH.  Last clinic visit on 03/08/20.  Wife provides history. Since that time, pt wife is requesting transport chair due to seizures patient has.  He is still in therapies.  He continues to follow up with Neurology. No plans for further adjustments to antiepileptics. Denies falls.  Pain Inventory Average Pain 0 Pain Right Now 0 My pain is na  LOCATION OF PAIN  NA  BOWEL Number of stools per week: 1-2 Oral laxative use No  Type of laxative  Enema or suppository use No  History of colostomy No  Incontinent No   BLADDER Dialysis In and out cath, frequency  Able to self cath No  Bladder incontinence No  Frequent urination No  Leakage with coughing No  Difficulty starting stream No  Incomplete bladder emptying No    Mobility walk with assistance use a walker ability to climb steps?  yes do you drive?  no use a wheelchair needs help with transfers  Function disabled: date disabled 09/09/2015 I need assistance with the following:  feeding, dressing, bathing and toileting  Neuro/Psych weakness  Prior Studies Any changes since last visit?  no  Physicians involved in your care Primary care [name], MD   Family History  Problem Relation Age of Onset  . Hypertension Mother   . Hypertension Father   . Stroke Father   . Sickle cell anemia Brother   . Diabetes Mother   . Sickle cell anemia Brother   . Diabetes type I Brother   . Kidney disease Brother    Social History   Socioeconomic History  . Marital status:  Married    Spouse name: sylvia  . Number of children: 1  . Years of education: college  . Highest education level: Not on file  Occupational History  . Occupation: [employer]  . Occupation: disability  Tobacco Use  . Smoking status: Never Smoker  . Smokeless tobacco: Never Used  Vaping Use  . Vaping Use: Never used  Substance and Sexual Activity  . Alcohol use: Not Currently  . Drug use: Never  . Sexual activity: Yes    Partners: Female  Other Topics Concern  . Not on file  Social History Narrative   ** Merged History Encounter **       Pt lives in 2 story home with his wife and 1 son Has masters degree in psychology Currently disabled.     Social Determinants of Health   Financial Resource Strain: Low Risk   . Difficulty of Paying Living Expenses: Not hard at all  Food Insecurity: No Food Insecurity  . Worried About Charity fundraiser in the Last Year: Never true  . Ran Out of Food in the Last Year: Never true  Transportation Needs: No Transportation Needs  . Lack of Transportation (Medical): No  . Lack of Transportation (Non-Medical): No  Physical Activity: Insufficiently Active  . Days of Exercise per Week: 4 days  . Minutes of Exercise per Session: 30 min  Stress: No Stress Concern Present  .  Feeling of Stress : Not at all  Social Connections:   . Frequency of Communication with Friends and Family: Not on file  . Frequency of Social Gatherings with Friends and Family: Not on file  . Attends Religious Services: Not on file  . Active Member of Clubs or Organizations: Not on file  . Attends Archivist Meetings: Not on file  . Marital Status: Not on file   Past Surgical History:  Procedure Laterality Date  . AV FISTULA PLACEMENT Right 12/05/2015   Procedure: INSERTION OF ARTERIOVENOUS (AV) GORE-TEX GRAFT ARM;  Surgeon: Elam Dutch, MD;  Location: [facility];  Service: Vascular;  Laterality: Right;  . BONE MARROW BIOPSY    . CATARACT  EXTRACTION  08/2019  . CATARACT EXTRACTION  09/2019  . CHOLECYSTECTOMY    . CHOLECYSTECTOMY  1990's?  . COLONOSCOPY WITH PROPOFOL N/A 01/22/2017   Procedure: COLONOSCOPY WITH PROPOFOL;  Surgeon: Wilford Corner, MD;  [facility];  Service: Endoscopy;  Laterality: N/A;  . EYE SURGERY Right   . INSERTION OF DIALYSIS CATHETER Right 10/16/2015   Procedure: INSERTION OF PALINDROME DIALYSIS CATHETER ;  Surgeon: Elam Dutch, MD;  Location: [facility];  Service: Vascular;  Laterality: Right;  . OTHER SURGICAL HISTORY     Retinal surgery  . PARS PLANA VITRECTOMY  02/17/2012   Procedure: PARS PLANA VITRECTOMY WITH 25 GAUGE;  Surgeon: Hayden Pedro, MD;  Location: [facility];  Service: Ophthalmology;  Laterality: Right;  . PERIPHERAL VASCULAR CATHETERIZATION N/A 03/20/2016   Procedure: A/V Shuntogram/Fistulagram;  Surgeon: Conrad Danville, MD;  Location: [facility];  Service: Cardiovascular;  Laterality: N/A;  . TEE WITHOUT CARDIOVERSION N/A 12/02/2016   Procedure: TRANSESOPHAGEAL ECHOCARDIOGRAM (TEE);  Surgeon: Larey Dresser, MD;  Location: [facility];  Service: Cardiovascular;  Laterality: N/A;   Past Medical History:  Diagnosis Date  . A-fib ([place])   . Anemia   . Asthma   . DM type 2 (diabetes mellitus, type 2) ([place]) 06/09/2019  . ESRD (end stage renal disease) on dialysis ([place])   . Essential hypertension 06/09/2019  . GIB (gastrointestinal bleeding)    Recurrent episodes- 09/2014, 09/2015 and 05/2016  . Gout   . History of recent blood transfusion 10/27/14   2 Units PRBC's  . Hyperkalemia 07/2011  . Multiple myeloma ([place])   . OSA on CPAP   . Pulmonary embolism ([place]) 07/2011; 09/27/2014   a. Bilat PE 07/2011 - unclear cause, tx with 6 months Coumadin.;   . Seizure disorder ([place]) 06/09/2019  . Sepsis ([place])   . Sickle cell-thalassemia disease ([place])    a. Sickle cell trait.  . Sleep apnea   . Stroke ([place]) 09/2015   R-MCA, L-MCA, PCA and bilateral cerebellar complicated by DVT/PE   . Subdural hematoma ([facility]) 05/2019   BP (!) 156/85   Pulse 73   Temp 98.5 F (36.9 C)   Ht _0  (1.778 m)   Wt 184 lb 9.6 oz (83.7 kg)   SpO2 98%   BMI 26.49 kg/m   Opioid Risk Score:   Fall Risk Score:  `1  Depression screen PHQ 2/9  Depression screen [facility] 2/9 04/12/2020 03/08/2020 09/22/2019 09/13/2019  Decreased Interest 0 0 0 0  Down, Depressed, Hopeless 0 0 0 0  PHQ - 2 Score 0 0 0 0  Some recent data might be hidden      Review of Systems  Endocrine:       High blood sugar  Musculoskeletal: Positive for arthralgias, gait problem and myalgias.  Neurological: Positive for seizures and weakness.  All other systems reviewed and are negative.      Objective:   Physical Exam Constitutional: NAD.  HENT: Normocephalic.  Atraumatic. Respiratory: Normal effort.  No stridor. Psych: Flat. Musc: No edema Gait: Short step/stride length with assistance from gait belt Neurologic: Alert HOH Dysarthria, stable Motor: RUE: 4-4+/5 RLE: 4+/5 MAS: Right  wrist flexors trace   Ankle dorsiflexors 0/4    Assessment & Plan:  Male with history of T2DM, A fib, ESRD-HD MWF, CVA, DVT/PE s/p IVC filter, seizure disorder, MM with recent admission to [facility] for stem cell transplant but procedure aborted due to findings of proteus mirabilis bacteremia, aspiration PNA, acute on chronic SDH, findings of bilateral renal calculi as well as breakthrough seizures presents for follow up for SDH.   1. Impaired ADLs/mobility and function secondary to acute on chronic SDH with 5 mm of midline shift to left- with R hemiparesis and dysarthria- f/u with NSU after rehab; no head CT unless has a change in function, per NSU             Continue therapies  Continue follow up with Neurology  OT ordered  Wheelchair/transport chair ordered-patient with limitations in mobility secondary to after mentioned limiting toileting, dressing, mobility, transfers and home.  This is exacerbated during dialysis sessions  and breakthrough seizures.  Patient's mobility limitation cannot be resolved with simple use of cane or walker.  Use of manual wheelchair will significantly improve patient's ability to safely perform ADLs.  Patient has a caregiver who is willing to provide assistance with a wheelchair.   2. Seizure disorder:              Antiepilectics adjusted             Continue follow up with Neurology  Seizures   3. Spasticity in setting of 2017 CVA with previous R hemiparesis             Controlled at present   4. Gait abnormality with falls             Continue walker for safety             Continue PT  Reminded on safety for wheelchair transfers

## 2020-05-15 NOTE — Therapy (Signed)
[facility] [address] [place], [place], [address] Phone: [phone]   Fax:  [phone]  Physical Therapy Treatment  Patient Details  Name: Isaiah Hanken [facility] MRN: [id number] Date of Birth: 09-28-1965 Referring Provider (PT): Delice Lesch, MD   Encounter Date: 05/15/2020   PT End of Session - 05/15/20 1237    Visit Number 8    Number of Visits 17    Date for PT Re-Evaluation 88/91/69   90 day cert for 8-wk POC   Authorization Type Medicare    Progress Note Due on Visit 10    PT Start Time 1232    PT Stop Time 1315    PT Time Calculation (min) 43 min    Equipment Utilized During Treatment Gait belt    Activity Tolerance Patient tolerated treatment well;No increased pain;Patient limited by fatigue   Pt appears fatigued today   Behavior During Therapy [facility] for tasks assessed/performed;Flat affect           Past Medical History:  Diagnosis Date  . A-fib ([place])   . Anemia   . Asthma   . DM type 2 (diabetes mellitus, type 2) ([place]) 06/09/2019  . ESRD (end stage renal disease) on dialysis ([place])   . Essential hypertension 06/09/2019  . GIB (gastrointestinal bleeding)    Recurrent episodes- 09/2014, 09/2015 and 05/2016  . Gout   . History of recent blood transfusion 10/27/14   2 Units PRBC's  . Hyperkalemia 07/2011  . Multiple myeloma ([place])   . OSA on CPAP   . Pulmonary embolism ([place]) 07/2011; 09/27/2014   a. Bilat PE 07/2011 - unclear cause, tx with 6 months Coumadin.;   . Seizure disorder ([place]) 06/09/2019  . Sepsis ([place])   . Sickle cell-thalassemia disease ([place])    a. Sickle cell trait.  . Sleep apnea   . Stroke ([place]) 09/2015   R-MCA, L-MCA, PCA and bilateral cerebellar complicated by DVT/PE  . Subdural hematoma ([place]) 05/2019    Past Surgical History:  Procedure Laterality Date  . AV FISTULA PLACEMENT Right 12/05/2015   Procedure: INSERTION OF ARTERIOVENOUS (AV) GORE-TEX GRAFT ARM;  Surgeon: Elam Dutch, MD;   Location: [place];  Service: Vascular;  Laterality: Right;  . BONE MARROW BIOPSY    . CATARACT EXTRACTION  08/2019  . CATARACT EXTRACTION  09/2019  . CHOLECYSTECTOMY    . CHOLECYSTECTOMY  1990's?  . COLONOSCOPY WITH PROPOFOL N/A 01/22/2017   Procedure: COLONOSCOPY WITH PROPOFOL;  Surgeon: Wilford Corner, MD;  Location: [facility];  Service: Endoscopy;  Laterality: N/A;  . EYE SURGERY Right   . INSERTION OF DIALYSIS CATHETER Right 10/16/2015   Procedure: INSERTION OF PALINDROME DIALYSIS CATHETER ;  Surgeon: Elam Dutch, MD;  Location: [facility];  Service: Vascular;  Laterality: Right;  . OTHER SURGICAL HISTORY     Retinal surgery  . PARS PLANA VITRECTOMY  02/17/2012   Procedure: PARS PLANA VITRECTOMY WITH 25 GAUGE;  Surgeon: Hayden Pedro, MD;  Location: [facility];  Service: Ophthalmology;  Laterality: Right;  . PERIPHERAL VASCULAR CATHETERIZATION N/A 03/20/2016   Procedure: A/V Shuntogram/Fistulagram;  Surgeon: Conrad Jeffers Gardens, MD;  Location: [facility];  Service: Cardiovascular;  Laterality: N/A;  . TEE WITHOUT CARDIOVERSION N/A 12/02/2016   Procedure: TRANSESOPHAGEAL ECHOCARDIOGRAM (TEE);  Surgeon: Larey Dresser, MD;  Location: [facility];  Service: Cardiovascular;  Laterality: N/A;    There were no vitals filed for this visit.   Subjective Assessment - 05/15/20 1236  Subjective No new complaints. No falls or pain to report.    Pertinent History multiple myeloma, ESRD on dialysis, DM type II, DVT with IVC filter, CVA, COPD, a-fib; chemo treatments down to 1x/month (next is August 17th)    Patient Stated Goals To get back to walk without the walker, back to his baseline    Currently in Pain? No/denies    Pain Score 0-No pain                 [facility] Adult PT Treatment/Exercise - 05/15/20 1238      Transfers   Transfers Sit to Stand;Stand to Sit    Sit to Stand 4: Min guard;With upper extremity assist;From chair/3-in-1;From bed    Stand to Sit 4: Min guard;4: Min  assist;With upper extremity assist;To bed;To chair/3-in-1;Uncontrolled descent      Ambulation/Gait   Ambulation/Gait Yes    Ambulation/Gait Assistance 4: Min guard    Ambulation/Gait Assistance Details cues needed on posture, to stay within the RW and to avoid objects with gait, pt able to self correct RW when cued.     Ambulation Distance (Feet) 110 Feet   x2   Assistive device Rolling walker    Gait Pattern Step-through pattern;Ataxic;Trunk flexed;Poor foot clearance - left;Poor foot clearance - right    Ambulation Surface Level;Indoor      Knee/Hip Exercises: Seated   Long Arc Quad AROM;Strengthening;Both;2 sets;10 reps;Weights;Limitations    Long Arc Quad Weight 2 lbs.    Long [employer] Limitations cues for form and technique    Marching AROM;Strengthening;Both;Weights;10 reps;Limitations;2 Firefighter Limitations cues for height and controlled lowering    Marching Weights 2 lbs.    Hamstring Curl AROM;Strengthening;Both;2 sets;10 reps;Limitations    Hamstring Limitations red band resistance with cues for slow, controlled movements               Balance Exercises - 05/15/20 1254      Balance Exercises: Standing   Standing Eyes Opened Wide (BOA);Head turns;Solid surface;Other reps (comment);Limitations    Standing Eyes Opened Limitations on floor with feet apart for head movements left<>right, up<>down with min guard assist. progressed to eyes closed (see below)    Standing Eyes Closed Narrow base of support (BOS);Solid surface;30 secs;Limitations;Wide (BOA);Head turns;Other reps (comment)    Standing Eyes Closed Limitations on floor with chair in front for safety with feet together- worked on [employer] holding balalnce with min to mod assist needed. pt with posterior lean/balance loss each time. with feet apart for EC head movements left<>right, up<>down, up to min assist for balance.                PT Short Term Goals - 05/08/20 1502      PT SHORT TERM GOAL #1   Title  Pt will perform HEP independenlty for improved strength, balance and gait.  TARGET 04/27/2020    Baseline performs, but needs supervision for safety in standing    Time 4    Period Weeks    Status Partially Met      PT SHORT TERM GOAL #2   Title Pt will improve Berg Score to at least 23/56 for decreased fall risk.    Baseline 13/56 03/29/2020; 21/56 05/03/2020    Time 4    Period Weeks    Status Partially Met      PT SHORT TERM GOAL #3   Title Pt will improve TUG score to less than or equal to 40 seconds for decreased  fall risk.    Baseline 52.25 sec 03/29/2020; 41.75 at best 05/03/2020    Time 4    Period Weeks    Status Partially Met      PT SHORT TERM GOAL #4   Title Pt will perform at least 3 minutes of standing with UE support and supervision for imrpoved standing tolerance for participation in ADLs.    Baseline 54 sec, 36 sec min guard/close supervision; 5:30 standing 05/03/2020    Time 4    Period Weeks    Status Achieved      PT SHORT TERM GOAL #5   Title Pt will improve gait velocity to at least 1.3 ft/sec for improved gait efficiency and safety.    Baseline 1.03 ft/sec 05/03/2020    Time 4    Period Weeks    Status Not Met      PT SHORT TERM GOAL #6   Title Pt/wife will verbalize understanding of fall prevention in home environment.    Time 4    Period Weeks    Status Achieved             PT Long Term Goals - 03/29/20 1348      PT LONG TERM GOAL #1   Title Pt will be independent with progression of HEP for improved strength, blaance, gait.  TARGET 05/25/2020    Time 8    Period Weeks    Status New      PT LONG TERM GOAL #2   Title Pt will imrpove Berg Balance score to at least 33/56 for decreased fall risk.    Time 8    Period Weeks    Status New      PT LONG TERM GOAL #3   Title Pt will improve TUG score to less than or equal to 30 seconds for decreased fall risk.    Time 8    Period Weeks    Status New      PT LONG TERM GOAL #4   Title Pt will  improve gait velocity to at least 1.8 ft/sec for improved gait velocity and decreased fall risk.    Time 8    Period Weeks    Status New      PT LONG TERM GOAL #5   Title Pt will ascend/descent 12 steps with both handrails with supervision only, to negotiate steps at home.    Time 8    Period Weeks    Status New                 Plan - 05/15/20 1237    Clinical Impression Statement Today's skilled session focused on gait training with RW with cues to avoid obstacles and stay closer to RW with gait. Session also continued to address LE strengthening and unsupported standing static balance. Pt with a tendency to lean posterior with balance ex's needing up to min assist with cues to correct. The pt is progressing toward goals and should benefit from continued PT to progress toward unmet goals.    Personal Factors and Comorbidities Comorbidity 3+    Comorbidities PMH:  multiple myeloma, ESRD on dialysis, DM type II, DVT with IVC filter, CVA, COPD, a-fib    Examination-Activity Limitations Locomotion Level;Transfers;Stairs;Stand    Examination-Participation Restrictions Community Activity    Stability/Clinical Decision Making Evolving/Moderate complexity    Rehab Potential Good    PT Frequency 2x / week    PT Duration 8 weeks   plus eval   PT Treatment/Interventions  ADLs/Self Care Home Management;DME Instruction;Neuromuscular re-education;Balance training;Therapeutic exercise;Therapeutic activities;Functional mobility training;Stair training;Gait training;Patient/family education    PT Next Visit Plan gait training with RW. continue with bil LE strengthening, balance and activity tolerance    PT Home Exercise Plan Access Code [email or web address]    Consulted and Agree with Plan of Care Patient           Patient will benefit from skilled therapeutic intervention in order to improve the following deficits and impairments:  Abnormal gait, Difficulty walking, Decreased safety awareness, Decreased  balance, Decreased mobility, Decreased strength  Visit Diagnosis: Muscle weakness (generalized)  Unsteadiness on feet  Other abnormalities of gait and mobility     Problem List Patient Active Problem List   Diagnosis Date Noted  . Colitis 03/15/2020  . AMS (altered mental status) 10/05/2019  . Acute metabolic encephalopathy 35/32/9924  . Headache, unspecified 09/28/2019  . Abnormality of gait 09/13/2019  . Diabetes mellitus with end-stage renal disease ([place]) 08/07/2019  . Benign hypertensive kidney disease with chronic kidney disease 08/07/2019  . Hypothyroidism, unspecified 07/15/2019  . Spastic hemiparesis ([place]) 07/12/2019  . Dysphagia, oropharyngeal phase 07/05/2019  . Allergy, unspecified, initial encounter 06/27/2019  . PAF (paroxysmal atrial fibrillation) ([place])   . Bacteremia   . Labile blood pressure   . Labile blood glucose   . Controlled type 2 diabetes mellitus with hyperglycemia, without long-term current use of insulin ([place])   . ESRD on dialysis ([place])   . Right sided weakness   . SDH (subdural hematoma) ([place])   . Chronic intracranial subdural hematoma ([facility]) 06/09/2019  . Multiple myeloma not having achieved remission ([place]) 06/09/2019  . ESRD on hemodialysis ([place]) 06/09/2019  . Essential hypertension 06/09/2019  . DM type 2 (diabetes mellitus, type 2) ([place]) 06/09/2019  . Seizure disorder ([place]) 06/09/2019  . Aspiration pneumonia ([place]) 06/09/2019  . History of bacteremia 06/09/2019  . Atrial fibrillation, chronic ([place]) 06/09/2019  . Bilateral kidney stones 05/31/2019  . Proteus infection 05/28/2019  . History of restrictive pulmonary disease 04/07/2019  . Multiple myeloma ([place]) 05/17/2018  . Goals of care, counseling/discussion 05/17/2018  . Partial idiopathic epilepsy with seizures of localized onset, intractable, without status epilepticus ([place]) 11/02/2017  . Iron deficiency anemia, unspecified 01/22/2017  . COPD (chronic obstructive pulmonary disease)  ([place]) 11/11/2016  . CVA (cerebral vascular accident) ([place]) 11/11/2016  . GERD (gastroesophageal reflux disease) 11/11/2016  . GI bleed 11/11/2016  . 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  . Obesity (BMI 30-39.9) 11/11/2016  . Pulmonary hypertension ([place]) 11/11/2016  . Patent foramen ovale 11/11/2016  . Hyperparathyroidism, secondary renal ([place]) 11/11/2016  . VTE (venous thromboembolism) 11/11/2016  . Hypotension 11/11/2016  . Systemic hypertension 11/11/2016  . Seizure ([place]) 11/11/2016  . Other seizures ([place]) 07/25/2016  . Encounter for removal of sutures 05/27/2016  . Ingrown nail 03/03/2016  . Onychomycosis due to dermatophyte 03/03/2016  . Other acquired hammer toe 03/03/2016  . Hemiparesis affecting right side as late effect of cerebrovascular accident ([place]) 02/28/2016  . Aphasia complicating stroke 26/83/4196  . Seizures ([place]) 01/08/2016  . History of ischemic left MCA stroke 01/08/2016  . Right sided weakness 01/08/2016  . Atrial fibrillation ([place]) 01/08/2016  . Leukocytosis 01/08/2016  . Benign essential HTN 01/08/2016  . Anemia of chronic disease 01/08/2016  . Controlled type 2 diabetes mellitus with diabetic nephropathy, without long-term current use of insulin ([place]) 01/08/2016  . History of DVT (deep vein thrombosis) 01/08/2016  . 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 Hypocalcemia 12/26/2015  .  Long term (current) use of anticoagulants 11/07/2015  . Aftercare including intermittent dialysis ([place]) 11/02/2015  . Other specified coagulation defects ([place]) 11/02/2015  . Complication of vascular dialysis catheter 11/02/2015  . Diarrhea, unspecified 11/02/2015  . Fever, unspecified 11/02/2015  . Pruritus, unspecified 11/02/2015  . Shortness of breath 11/02/2015  . ESRD (end stage renal disease) on dialysis ([place])     Willow Ora, PTA, [facility] [address] [place], [place] [address] [phone] 05/15/20, 2:21 PM   Name:  Isaiah [name] MRN: [id number] Date of Birth: 10-29-1965

## 2020-05-16 ENCOUNTER — Encounter: Payer: Self-pay | Admitting: Oncology

## 2020-05-16 DIAGNOSIS — N2581 Secondary hyperparathyroidism of renal origin: Secondary | ICD-10-CM | POA: Diagnosis not present

## 2020-05-16 DIAGNOSIS — Z992 Dependence on renal dialysis: Secondary | ICD-10-CM | POA: Diagnosis not present

## 2020-05-16 DIAGNOSIS — D509 Iron deficiency anemia, unspecified: Secondary | ICD-10-CM | POA: Diagnosis not present

## 2020-05-16 DIAGNOSIS — Z23 Encounter for immunization: Secondary | ICD-10-CM | POA: Diagnosis not present

## 2020-05-16 DIAGNOSIS — E1129 Type 2 diabetes mellitus with other diabetic kidney complication: Secondary | ICD-10-CM | POA: Diagnosis not present

## 2020-05-16 DIAGNOSIS — N186 End stage renal disease: Secondary | ICD-10-CM | POA: Diagnosis not present

## 2020-05-16 MED FILL — ATORVASTATIN CALCIUM 20 MG: 20 | 30 days supply | Qty: 30 | Fill #0

## 2020-05-16 NOTE — Progress Notes (Signed)
Patient's spouse called to inquire about a denial for dos for the month of March. She has a copy of denial letter w/ reasons. Advised her to email to [place]. for further review. She verbalized understanding.

## 2020-05-17 ENCOUNTER — Other Ambulatory Visit: Payer: Self-pay

## 2020-05-17 ENCOUNTER — Encounter (INDEPENDENT_AMBULATORY_CARE_PROVIDER_SITE_OTHER): Payer: Medicare Other | Admitting: Ophthalmology

## 2020-05-17 ENCOUNTER — Ambulatory Visit: Payer: Medicare Other | Admitting: Physical Therapy

## 2020-05-17 ENCOUNTER — Encounter (INDEPENDENT_AMBULATORY_CARE_PROVIDER_SITE_OTHER): Payer: Self-pay

## 2020-05-17 DIAGNOSIS — R2689 Other abnormalities of gait and mobility: Secondary | ICD-10-CM | POA: Diagnosis not present

## 2020-05-17 DIAGNOSIS — M6281 Muscle weakness (generalized): Secondary | ICD-10-CM

## 2020-05-17 DIAGNOSIS — S065X9A Traumatic subdural hemorrhage with loss of consciousness of unspecified duration, initial encounter: Secondary | ICD-10-CM | POA: Diagnosis not present

## 2020-05-17 DIAGNOSIS — R2681 Unsteadiness on feet: Secondary | ICD-10-CM

## 2020-05-18 DIAGNOSIS — D509 Iron deficiency anemia, unspecified: Secondary | ICD-10-CM | POA: Diagnosis not present

## 2020-05-18 DIAGNOSIS — Z992 Dependence on renal dialysis: Secondary | ICD-10-CM | POA: Diagnosis not present

## 2020-05-18 DIAGNOSIS — N186 End stage renal disease: Secondary | ICD-10-CM | POA: Diagnosis not present

## 2020-05-18 DIAGNOSIS — E1129 Type 2 diabetes mellitus with other diabetic kidney complication: Secondary | ICD-10-CM | POA: Diagnosis not present

## 2020-05-18 DIAGNOSIS — Z23 Encounter for immunization: Secondary | ICD-10-CM | POA: Diagnosis not present

## 2020-05-18 DIAGNOSIS — N2581 Secondary hyperparathyroidism of renal origin: Secondary | ICD-10-CM | POA: Diagnosis not present

## 2020-05-18 NOTE — Therapy (Signed)
[facility] [address] [place], [place], [address] Phone: [phone]   Fax:  [phone]  Physical Therapy Treatment  Patient Details  Name: Isaiah [name] [facility] MRN: [id number] Date of Birth: November 15, 1965 Referring Provider (PT): Delice Lesch, MD   Encounter Date: 05/17/2020   PT End of Session - 05/18/20 1727    Visit Number 9    Number of Visits 17    Date for PT Re-Evaluation 76/22/63   90 day cert for 8-wk POC   Authorization Type Medicare    Progress Note Due on Visit 10    PT Start Time 1019    PT Stop Time 1100    PT Time Calculation (min) 41 min    Equipment Utilized During Treatment Gait belt    Activity Tolerance Patient tolerated treatment well;No increased pain    Behavior During Therapy [facility] for tasks assessed/performed;Flat affect           Past Medical History:  Diagnosis Date  . A-fib ([place])   . Anemia   . Asthma   . DM type 2 (diabetes mellitus, type 2) ([place]) 06/09/2019  . ESRD (end stage renal disease) on dialysis ([place])   . Essential hypertension 06/09/2019  . GIB (gastrointestinal bleeding)    Recurrent episodes- 09/2014, 09/2015 and 05/2016  . Gout   . History of recent blood transfusion 10/27/14   2 Units PRBC's  . Hyperkalemia 07/2011  . Multiple myeloma ([place])   . OSA on CPAP   . Pulmonary embolism ([place]) 07/2011; 09/27/2014   a. Bilat PE 07/2011 - unclear cause, tx with 6 months Coumadin.;   . Seizure disorder ([place]) 06/09/2019  . Sepsis ([place])   . Sickle cell-thalassemia disease ([place])    a. Sickle cell trait.  . Sleep apnea   . Stroke ([place]) 09/2015   R-MCA, L-MCA, PCA and bilateral cerebellar complicated by DVT/PE  . Subdural hematoma ([place]) 05/2019    Past Surgical History:  Procedure Laterality Date  . AV FISTULA PLACEMENT Right 12/05/2015   Procedure: INSERTION OF ARTERIOVENOUS (AV) GORE-TEX GRAFT ARM;  Surgeon: Elam Dutch, MD;  Location: [place];  Service: Vascular;  Laterality:  Right;  . BONE MARROW BIOPSY    . CATARACT EXTRACTION  08/2019  . CATARACT EXTRACTION  09/2019  . CHOLECYSTECTOMY    . CHOLECYSTECTOMY  1990's?  . COLONOSCOPY WITH PROPOFOL N/A 01/22/2017   Procedure: COLONOSCOPY WITH PROPOFOL;  Surgeon: Wilford Corner, MD;  Location: [facility];  Service: Endoscopy;  Laterality: N/A;  . EYE SURGERY Right   . INSERTION OF DIALYSIS CATHETER Right 10/16/2015   Procedure: INSERTION OF PALINDROME DIALYSIS CATHETER ;  Surgeon: Elam Dutch, MD;  Location: [facility];  Service: Vascular;  Laterality: Right;  . OTHER SURGICAL HISTORY     Retinal surgery  . PARS PLANA VITRECTOMY  02/17/2012   Procedure: PARS PLANA VITRECTOMY WITH 25 GAUGE;  Surgeon: Hayden Pedro, MD;  Location: [place];  Service: Ophthalmology;  Laterality: Right;  . PERIPHERAL VASCULAR CATHETERIZATION N/A 03/20/2016   Procedure: A/V Shuntogram/Fistulagram;  Surgeon: Conrad Delano, MD;  Location: [facility];  Service: Cardiovascular;  Laterality: N/A;  . TEE WITHOUT CARDIOVERSION N/A 12/02/2016   Procedure: TRANSESOPHAGEAL ECHOCARDIOGRAM (TEE);  Surgeon: Larey Dresser, MD;  Location: [facility];  Service: Cardiovascular;  Laterality: N/A;    There were no vitals filed for this visit.   Subjective Assessment - 05/17/20 1024    Subjective No changes, no falls.  Pertinent History multiple myeloma, ESRD on dialysis, DM type II, DVT with IVC filter, CVA, COPD, a-fib; chemo treatments down to 1x/month (next is August 17th)    Patient Stated Goals To get back to walk without the walker, back to his baseline    Currently in Pain? No/denies                             [name] Adult PT Treatment/Exercise - 05/18/20 1021      Transfers   Transfers Sit to Stand;Stand to Sit    Sit to Stand 4: Min guard;With upper extremity assist;From chair/3-in-1;From bed    Stand to Sit 4: Min guard;4: Min assist;With upper extremity assist;To bed;To chair/3-in-1;Uncontrolled descent     Number of Reps 10 reps;1 set   from mat height   Comments additional sit<>stand at least 3-5 reps throughout session      Ambulation/Gait   Ambulation/Gait Yes    Ambulation/Gait Assistance 4: Min guard    Ambulation/Gait Assistance Details Cues needed for posture, to keep RW close and to avoid obstacles/objects with gait.  With cues, extra time, able to self-correct    Ambulation Distance (Feet) 60 Feet   x 2; 100 ft x 2   Assistive device Rolling walker    Gait Pattern Step-through pattern;Ataxic;Trunk flexed;Poor foot clearance - left;Poor foot clearance - right    Ambulation Surface Level;Indoor      Knee/Hip Exercises: Standing   Knee Flexion Strengthening;Right;Left;2 sets;10 reps    Hip Abduction Stengthening;Right;Left;2 sets;10 reps;Knee straight   step taps   Hip Extension Stengthening;Right;Left;2 sets;10 reps;Knee straight   step taps   Forward Step Up Right;Left;1 set;5 reps;Hand Hold: 2;Step Height: 6"    Forward Step Up Limitations step up/up, down/down with max cues for technique               Balance Exercises - 05/17/20 1025      Balance Exercises: Standing   Standing Eyes Opened Wide (BOA);Head turns;Solid surface;Other reps (comment);Limitations    Standing Eyes Opened Limitations on floor with feet apart for head movements left<>right, up<>down with min guard assist. progressed to eyes closed (see below)   Standing at counter for support, tactile cues at trunk   Standing Eyes Closed Narrow base of support (BOS);Solid surface;30 secs;Limitations;Wide (BOA);Head turns;Other reps (comment)    Standing Eyes Closed Limitations on floor with counter in front for safety with feet together- worked on [employer] holding balalnce with min to mod assist needed. pt with posterior lean/balance loss each time. with feet apart for EC head movements left<>right, up<>down, up to min assist for balance.    Tactile cues at trunk for improved posture   SLS with Vectors Solid surface;Upper  extremity assist 2;Other reps (comment);Other (comment);Limitations    SLS with Vectors Limitations Alternating step taps to 4" step, 2 sets x 10 reps    Marching Solid surface;Upper extremity assist 2;Static;10 reps               PT Short Term Goals - 05/08/20 1502      PT SHORT TERM GOAL #1   Title Pt will perform HEP independenlty for improved strength, balance and gait.  TARGET 04/27/2020    Baseline performs, but needs supervision for safety in standing    Time 4    Period Weeks    Status Partially Met      PT SHORT TERM GOAL #2   Title Pt will  improve Berg Score to at least 23/56 for decreased fall risk.    Baseline 13/56 03/29/2020; 21/56 05/03/2020    Time 4    Period Weeks    Status Partially Met      PT SHORT TERM GOAL #3   Title Pt will improve TUG score to less than or equal to 40 seconds for decreased fall risk.    Baseline 52.25 sec 03/29/2020; 41.75 at best 05/03/2020    Time 4    Period Weeks    Status Partially Met      PT SHORT TERM GOAL #4   Title Pt will perform at least 3 minutes of standing with UE support and supervision for imrpoved standing tolerance for participation in ADLs.    Baseline 54 sec, 36 sec min guard/close supervision; 5:30 standing 05/03/2020    Time 4    Period Weeks    Status Achieved      PT SHORT TERM GOAL #5   Title Pt will improve gait velocity to at least 1.3 ft/sec for improved gait efficiency and safety.    Baseline 1.03 ft/sec 05/03/2020    Time 4    Period Weeks    Status Not Met      PT SHORT TERM GOAL #6   Title Pt/wife will verbalize understanding of fall prevention in home environment.    Time 4    Period Weeks    Status Achieved             PT Long Term Goals - 05/18/20 1730      PT LONG TERM GOAL #1   Title Pt will be independent with progression of HEP for improved strength, blaance, gait.  TARGET 06/08/2020-extended due to delays in scheduling    Time 8    Period Weeks    Status New      PT LONG TERM  GOAL #2   Title Pt will imrpove Berg Balance score to at least 33/56 for decreased fall risk.    Time 8    Period Weeks    Status New      PT LONG TERM GOAL #3   Title Pt will improve TUG score to less than or equal to 30 seconds for decreased fall risk.    Time 8    Period Weeks    Status New      PT LONG TERM GOAL #4   Title Pt will improve gait velocity to at least 1.8 ft/sec for improved gait velocity and decreased fall risk.    Time 8    Period Weeks    Status New      PT LONG TERM GOAL #5   Title Pt will ascend/descent 12 steps with both handrails with supervision only, to negotiate steps at home.    Time 8    Period Weeks    Status New                 Plan - 05/18/20 1728    Clinical Impression Statement Continued work on standing balance and strengthening exercises; pt does tend to have posterior lean at times with balance exercises and requires UE support and min guard from therapist.  Will continue to benefit from further skilled PT to work towards [place].    Personal Factors and Comorbidities Comorbidity 3+    Comorbidities PMH:  multiple myeloma, ESRD on dialysis, DM type II, DVT with IVC filter, CVA, COPD, a-fib    Examination-Activity Limitations Locomotion Level;Transfers;Stairs;Stand  Examination-Participation Restrictions Community Activity    Stability/Clinical Decision Making Evolving/Moderate complexity    Rehab Potential Good    PT Frequency 2x / week    PT Duration 8 weeks   plus eval   PT Treatment/Interventions ADLs/Self Care Home Management;DME Instruction;Neuromuscular re-education;Balance training;Therapeutic exercise;Therapeutic activities;Functional mobility training;Stair training;Gait training;Patient/family education    PT Next Visit Plan 10th Visit PROGRESS NOTE; Continue standing balance and standing strengthening; gait training with RW (obstacle negoitation); OT order is on chart-have pt schedule (Please check about appts-he is only  scheduled 1x/wk for next 3 weeks)    PT Home Exercise Plan Access Code [email or web address]    Consulted and Agree with Plan of Care Patient           Patient will benefit from skilled therapeutic intervention in order to improve the following deficits and impairments:  Abnormal gait, Difficulty walking, Decreased safety awareness, Decreased balance, Decreased mobility, Decreased strength  Visit Diagnosis: Unsteadiness on feet  Muscle weakness (generalized)  Other abnormalities of gait and mobility     Problem List Patient Active Problem List   Diagnosis Date Noted  . Colitis 03/15/2020  . AMS (altered mental status) 10/05/2019  . Acute metabolic encephalopathy 54/56/2563  . Headache, unspecified 09/28/2019  . Abnormality of gait 09/13/2019  . Diabetes mellitus with end-stage renal disease ([place]) 08/07/2019  . Benign hypertensive kidney disease with chronic kidney disease 08/07/2019  . Hypothyroidism, unspecified 07/15/2019  . Spastic hemiparesis ([place]) 07/12/2019  . Dysphagia, oropharyngeal phase 07/05/2019  . Allergy, unspecified, initial encounter 06/27/2019  . PAF (paroxysmal atrial fibrillation) ([place])   . Bacteremia   . Labile blood pressure   . Labile blood glucose   . Controlled type 2 diabetes mellitus with hyperglycemia, without long-term current use of insulin ([place])   . ESRD on dialysis ([place])   . Right sided weakness   . SDH (subdural hematoma) ([place])   . Chronic intracranial subdural hematoma ([facility]) 06/09/2019  . Multiple myeloma not having achieved remission ([place]) 06/09/2019  . ESRD on hemodialysis ([place]) 06/09/2019  . Essential hypertension 06/09/2019  . DM type 2 (diabetes mellitus, type 2) ([place]) 06/09/2019  . Seizure disorder ([place]) 06/09/2019  . Aspiration pneumonia ([place]) 06/09/2019  . History of bacteremia 06/09/2019  . Atrial fibrillation, chronic ([place]) 06/09/2019  . Bilateral kidney stones 05/31/2019  . Proteus infection 05/28/2019  . History of restrictive  pulmonary disease 04/07/2019  . Multiple myeloma ([place]) 05/17/2018  . Goals of care, counseling/discussion 05/17/2018  . Partial idiopathic epilepsy with seizures of localized onset, intractable, without status epilepticus ([place]) 11/02/2017  . Iron deficiency anemia, unspecified 01/22/2017  . COPD (chronic obstructive pulmonary disease) ([place]) 11/11/2016  . CVA (cerebral vascular accident) ([place]) 11/11/2016  . GERD (gastroesophageal reflux disease) 11/11/2016  . GI bleed 11/11/2016  . 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  . Obesity (BMI 30-39.9) 11/11/2016  . Pulmonary hypertension ([place]) 11/11/2016  . Patent foramen ovale 11/11/2016  . Hyperparathyroidism, secondary renal ([place]) 11/11/2016  . VTE (venous thromboembolism) 11/11/2016  . Hypotension 11/11/2016  . Systemic hypertension 11/11/2016  . Seizure ([place]) 11/11/2016  . Other seizures ([place]) 07/25/2016  . Encounter for removal of sutures 05/27/2016  . Ingrown nail 03/03/2016  . Onychomycosis due to dermatophyte 03/03/2016  . Other acquired hammer toe 03/03/2016  . Hemiparesis affecting right side as late effect of cerebrovascular accident ([place]) 02/28/2016  . Aphasia complicating stroke 89/37/3428  . Seizures ([place]) 01/08/2016  . History of ischemic left MCA stroke 01/08/2016  . Right sided  weakness 01/08/2016  . Atrial fibrillation ([place]) 01/08/2016  . Leukocytosis 01/08/2016  . Benign essential HTN 01/08/2016  . Anemia of chronic disease 01/08/2016  . Controlled type 2 diabetes mellitus with diabetic nephropathy, without long-term current use of insulin ([place]) 01/08/2016  . History of DVT (deep vein thrombosis) 01/08/2016  . 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 Hypocalcemia 12/26/2015  . Long term (current) use of anticoagulants 11/07/2015  . Aftercare including intermittent dialysis ([place]) 11/02/2015  . Other specified coagulation defects ([place]) 11/02/2015  . Complication of vascular dialysis catheter 11/02/2015  .  Diarrhea, unspecified 11/02/2015  . Fever, unspecified 11/02/2015  . Pruritus, unspecified 11/02/2015  . Shortness of breath 11/02/2015  . ESRD (end stage renal disease) on dialysis [facility])     Brent Taillon W. 05/18/2020, 5:35 PM  Frazier Butt., PT   [facility] [address] [place], [place], [address] Phone: [phone]   Fax:  [phone]  Name: Isaiah [name] [facility] MRN: [id number] Date of Birth: 10-22-1965

## 2020-05-21 DIAGNOSIS — E1129 Type 2 diabetes mellitus with other diabetic kidney complication: Secondary | ICD-10-CM | POA: Diagnosis not present

## 2020-05-21 DIAGNOSIS — Z23 Encounter for immunization: Secondary | ICD-10-CM | POA: Diagnosis not present

## 2020-05-21 DIAGNOSIS — N186 End stage renal disease: Secondary | ICD-10-CM | POA: Diagnosis not present

## 2020-05-21 DIAGNOSIS — D509 Iron deficiency anemia, unspecified: Secondary | ICD-10-CM | POA: Diagnosis not present

## 2020-05-21 DIAGNOSIS — N2581 Secondary hyperparathyroidism of renal origin: Secondary | ICD-10-CM | POA: Diagnosis not present

## 2020-05-21 DIAGNOSIS — Z992 Dependence on renal dialysis: Secondary | ICD-10-CM | POA: Diagnosis not present

## 2020-05-22 ENCOUNTER — Inpatient Hospital Stay: Payer: Medicare Other | Attending: Oncology

## 2020-05-22 ENCOUNTER — Inpatient Hospital Stay: Payer: Medicare Other

## 2020-05-22 ENCOUNTER — Inpatient Hospital Stay (HOSPITAL_BASED_OUTPATIENT_CLINIC_OR_DEPARTMENT_OTHER): Payer: Medicare Other | Admitting: Oncology

## 2020-05-22 ENCOUNTER — Other Ambulatory Visit: Payer: Self-pay

## 2020-05-22 ENCOUNTER — Encounter: Payer: Self-pay | Admitting: Physical Therapy

## 2020-05-22 ENCOUNTER — Ambulatory Visit: Payer: Medicare Other | Admitting: Physical Therapy

## 2020-05-22 VITALS — BP 164/47 | HR 73 | Temp 96.9°F | Resp 18 | Ht 70.0 in | Wt 185.3 lb

## 2020-05-22 DIAGNOSIS — C9000 Multiple myeloma not having achieved remission: Secondary | ICD-10-CM

## 2020-05-22 DIAGNOSIS — Z5112 Encounter for antineoplastic immunotherapy: Secondary | ICD-10-CM | POA: Diagnosis not present

## 2020-05-22 DIAGNOSIS — S065X9A Traumatic subdural hemorrhage with loss of consciousness of unspecified duration, initial encounter: Secondary | ICD-10-CM | POA: Diagnosis not present

## 2020-05-22 DIAGNOSIS — M6281 Muscle weakness (generalized): Secondary | ICD-10-CM | POA: Diagnosis not present

## 2020-05-22 DIAGNOSIS — R2681 Unsteadiness on feet: Secondary | ICD-10-CM | POA: Diagnosis not present

## 2020-05-22 DIAGNOSIS — R2689 Other abnormalities of gait and mobility: Secondary | ICD-10-CM | POA: Diagnosis not present

## 2020-05-22 LAB — CBC WITH DIFFERENTIAL (CANCER CENTER ONLY)
Abs Immature Granulocytes: 0.01 10*3/uL (ref 0.00–0.07)
Basophils Absolute: 0.1 10*3/uL (ref 0.0–0.1)
Basophils Relative: 2 %
Eosinophils Absolute: 0.8 10*3/uL — ABNORMAL HIGH (ref 0.0–0.5)
Eosinophils Relative: 11 %
Hemoglobin: 12 g/dL — ABNORMAL LOW (ref 13.0–17.0)
Immature Granulocytes: 0 %
Lymphocytes Relative: 11 %
Lymphs Abs: 0.8 10*3/uL (ref 0.7–4.0)
Monocytes Absolute: 1.1 10*3/uL — ABNORMAL HIGH (ref 0.1–1.0)
Monocytes Relative: 15 %
Neutro Abs: 4.3 10*3/uL (ref 1.7–7.7)
Neutrophils Relative %: 61 %
Platelet Count: 243 10*3/uL (ref 150–400)
WBC Count: 7.1 10*3/uL (ref 4.0–10.5)
nRBC: 1.8 % — ABNORMAL HIGH (ref 0.0–0.2)

## 2020-05-22 LAB — CMP (CANCER CENTER ONLY)
ALT: 15 U/L (ref 0–44)
AST: 16 U/L (ref 15–41)
Albumin: 4.4 g/dL (ref 3.5–5.0)
Alkaline Phosphatase: 133 U/L — ABNORMAL HIGH (ref 38–126)
Anion gap: 11 (ref 5–15)
BUN: 32 mg/dL — ABNORMAL HIGH (ref 6–20)
CO2: 33 mmol/L — ABNORMAL HIGH (ref 22–32)
Calcium: 9.7 mg/dL (ref 8.9–10.3)
Chloride: 96 mmol/L — ABNORMAL LOW (ref 98–111)
Creatinine: 7.67 mg/dL (ref 0.61–1.24)
GFR, Est AFR Am: 8 mL/min — ABNORMAL LOW (ref >60)
GFR, Estimated: 7 mL/min — ABNORMAL LOW (ref >60)
Glucose, Bld: 159 mg/dL — ABNORMAL HIGH (ref 70–99)
Potassium: 4 mmol/L (ref 3.5–5.1)
Sodium: 140 mmol/L (ref 135–145)
Total Bilirubin: 0.9 mg/dL (ref 0.3–1.2)
Total Protein: 7.3 g/dL (ref 6.5–8.1)

## 2020-05-22 MED ORDER — DIPHENHYDRAMINE HCL 25 MG PO CAPS
50.0000 mg | ORAL_CAPSULE | Freq: Once | ORAL | Status: AC
  Administered 2020-05-22: 50 mg via ORAL

## 2020-05-22 MED ORDER — ACETAMINOPHEN 325 MG PO TABS
650.0000 mg | ORAL_TABLET | Freq: Once | ORAL | Status: AC
  Administered 2020-05-22: 650 mg via ORAL

## 2020-05-22 MED ORDER — DARATUMUMAB-HYALURONIDASE-FIHJ 1800-30000 MG-UT/15ML ~~LOC~~ SOLN
1800.0000 mg | Freq: Once | SUBCUTANEOUS | Status: AC
  Administered 2020-05-22: 1800 mg via SUBCUTANEOUS
  Filled 2020-05-22: qty 15

## 2020-05-22 NOTE — Progress Notes (Signed)
CRITICAL VALUE STICKER  CRITICAL VALUE: Creatinine 7.67  RECEIVER (on-site recipient of call): Meriel Flavors LPN  [facility] NOTIFIED:  05/22/20 958am  MESSENGER (representative from lab): Delsa Sale MD NOTIFIED:  Dr Alen Blew  TIME OF NOTIFICATION:  1003 RESPONSE:  Noted

## 2020-05-22 NOTE — Progress Notes (Signed)
Per lab, CBC is taking longer than usual to run due to "interfering substance" with the results process; however, ANC, Hg and PLT results were able to be identified. Per lab technician, ANC=4.3, Hg=12, and PLT=243.

## 2020-05-22 NOTE — Progress Notes (Signed)
Hematology and Oncology Follow Up Visit  Isaiah [name] [id number] 1966-06-27 54 y.o. 05/22/2020 9:24 AM    Principle Diagnosis: 54 year old man with multiple myeloma diagnosed in 2019.  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   Past therapy: Velcade dexamethasone and cyclophosphamide weekly started on 05/25/2018.  Velcade is given at 1.5 mg/m weekly, dexamethasone is 20 mg weekly and and Cytoxan is given at 650 mg total dose.  Last treatment was given in August 2020.    Therapy was held today in anticipation of high-dose chemotherapy and autologous stem cell transplant.  He did not receive stem cell transplant due to subdural hematoma and sepsis in September 2020.  Velcade at 1.5 mg per metered squared subcutaneously weekly, dexamethasone 20 mg weekly, daratumumab 1800 mg subcutaneous injection.   Cycle 1 started on 08/30/2019.  Therapy concluded in June 2021.   Current therapy:  Daratumumab monthly maintenance started in June 2021.  He is here for the next cycle of therapy.  Interim History:  [name] returns today for repeat evaluation.  Since the last visit, he reports no major changes in his health. He denies any nausea, vomiting or abdominal pain. He continues to tolerate daratumumab without any complaints. Continues to participate in physical therapy and getting stronger. He is ambulating with the help of walker without any recent falls. He continues to receive dialysis without any issues.                      Medications: Reviewed without changes  Current Outpatient Medications:  .  acetaminophen (TYLENOL) 325 MG tablet, Take 1-2 tablets (325-650 mg total) by mouth every 4 (four) hours as needed for mild pain., Disp:  , Rfl:  .  acyclovir (ZOVIRAX) 400 MG tablet, TAKE 1 TABLET BY MOUTH ONCE DAILY, Disp: 90 tablet, Rfl: 0 .  albuterol (PROAIR HFA) 108 (90 Base) MCG/ACT inhaler, Inhale two puffs every 4-6 hours if needed for cough or wheeze (Patient taking  differently: Inhale 2 puffs into the lungs every 4 (four) hours as needed for wheezing or shortness of breath. Inhale two puffs every 4-6 hours if needed for cough or wheeze), Disp: 6.7 g, Rfl: 2 .  Alcohol Swabs (SM ALCOHOL PREP) 70 % PADS, , Disp: , Rfl:  .  atorvastatin (LIPITOR) 20 MG tablet, TAKE 1 TABLET BY MOUTH ONCE A DAY, Disp: 30 tablet, Rfl: 0 .  AURYXIA 1 GM 210 MG(Fe) tablet, Take 210 mg by mouth 3 (three) times daily., Disp: , Rfl:  .  BREO ELLIPTA 200-25 MCG/INH AEPB, INHALE 1 PUFF BY MOUTH AT THE SAME TIME EVERY DAY (Patient taking differently: Inhale 1 puff into the lungs daily. ), Disp: 60 each, Rfl: 2 .  brimonidine (ALPHAGAN) 0.2 % ophthalmic solution, Place 1 drop into the right eye 2 (two) times daily. , Disp: , Rfl:  .  camphor-menthol (SARNA) lotion, Apply 1 application topically every 8 (eight) hours as needed for itching., Disp: 222 mL, Rfl: 0 .  Cetirizine HCl 10 MG CAPS, Take 1 capsule by mouth daily. , Disp: , Rfl:  .  clonazePAM (KLONOPIN) 0.5 MG tablet, Take 2 tablets every Monday, Wednesday, and Friday with hemodialysis (Patient taking differently: Take 1 mg by mouth every Monday, Wednesday, and Friday. Before dialysis), Disp: 30 tablet, Rfl: 5 .  FREESTYLE LITE test strip, , Disp: , Rfl:  .  hydrOXYzine (ATARAX/VISTARIL) 25 MG tablet, TAKE 1 TABLET (25 MG TOTAL) BY MOUTH EVERY 8 (EIGHT) HOURS AS  NEEDED FOR ITCHING., Disp: 30 tablet, Rfl: 0 .  Insulin Glargine (BASAGLAR KWIKPEN) 100 UNIT/ML, Inject 10 Units into the skin daily. , Disp: , Rfl:  .  Insulin Lispro (HUMALOG KWIKPEN [place]), Inject 2-4 Units into the skin 3 (three) times daily before meals. Sliding scale: 150-250 =2 units, 250 -350=3 units, 350 += 4 units, Disp: , Rfl:  .  lacosamide (VIMPAT) 200 MG TABS tablet, Take 1 tablet twice a day except on dialysis days M-W-F give 1 tab in AM, 1 tab after dialysis, 1 tab in PM., Disp: 90 tablet, Rfl: 5 .  lamoTRIgine (LAMICTAL) 200 MG tablet, Take 1 tablet (200 mg total)  by mouth 2 (two) times daily., Disp: 60 tablet, Rfl: 11 .  Lancets (FREESTYLE) lancets, , Disp: , Rfl:  .  metoprolol tartrate (LOPRESSOR) 25 MG tablet, Take 25 mg by mouth daily., Disp: , Rfl:  .  metroNIDAZOLE (FLAGYL) 250 MG tablet, Take 1 tablet (250 mg total) by mouth every 8 (eight) hours., Disp: 9 tablet, Rfl: 0 .  midodrine (PROAMATINE) 10 MG tablet, TAKE 1 TABLET BY MOUTH TWICE DAILY WITH MEALS, Disp: 60 tablet, Rfl: 0 .  multivitamin (RENA-VIT) TABS tablet, Take 1 tablet by mouth at bedtime., Disp: , Rfl:  .  Nutritional Supplements (FEEDING SUPPLEMENT, NEPRO CARB STEADY,) LIQD, Take 237 mLs by mouth 3 (three) times daily as needed (Supplement)., Disp: 237 mL, Rfl: 12 .  pantoprazole (PROTONIX) 40 MG tablet, TAKE 1 TABLET BY MOUTH ONCE DAILY, Disp: 90 tablet, Rfl: 1 .  prochlorperazine (COMPAZINE) 10 MG tablet, TAKE 1 TABLET (10 MG TOTAL) BY MOUTH EVERY 6 (SIX) HOURS AS NEEDED FOR NAUSEA OR VOMITING., Disp: 30 tablet, Rfl: 0 .  UNIFINE PENTIPS 31G X 8 MM MISC, USE AS DIRECTED DAILY AFTER SUPPER, Disp: 100 each, Rfl: 3  Allergies:  Allergies  Allergen Reactions  . Codeine Swelling  . Codeine Rash and Other (See Comments)    Unknown reaction (patient says it was more serious than just a rash, but he can't remember what happened)       Physical Exam:      Blood pressure (!) 164/47, pulse 73, temperature (!) 96.9 F (36.1 C), temperature source Tympanic, resp. rate 18, height 5' 10"  (1.778 m), weight 185 lb 4.8 oz (84.1 kg), SpO2 98 %.         ECOG: 1    General appearance: Alert, awake without any distress. Head: Atraumatic without abnormalities Oropharynx: Without any thrush or ulcers. Eyes: No scleral icterus. Lymph nodes: No lymphadenopathy noted in the cervical, supraclavicular, or axillary nodes Heart:regular rate and rhythm, without any murmurs or gallops.   Lung: Clear to auscultation without any rhonchi, wheezes or dullness to percussion. Abdomin: Soft,  nontender without any shifting dullness or ascites. Musculoskeletal: No clubbing or cyanosis. Neurological: No motor or sensory deficits. Skin: No rashes or lesions.                   Lab Results: Lab Results  Component Value Date   WBC 6.9 04/24/2020   HGB 13.2 04/24/2020   HCT 35.6 (L) 04/24/2020   MCV 90.8 04/24/2020   PLT 218 04/24/2020     Chemistry      Component Value Date/Time   NA 142 04/24/2020 1108   NA 142 02/15/2020 0000   NA 138 07/03/2017 0813   K 3.9 04/24/2020 1108   K 4.2 07/03/2017 0813   CL 96 (L) 04/24/2020 1108   CO2 32 04/24/2020 1108  CO2 27 07/03/2017 0813   BUN 36 (H) 04/24/2020 1108   BUN 41 (A) 03/22/2020 0000   BUN 46.4 (H) 07/03/2017 0813   CREATININE 8.65 (HH) 04/24/2020 1108   CREATININE 10.9 (HH) 07/03/2017 0813   GLU 157 03/22/2020 0000      Component Value Date/Time   CALCIUM 10.5 (H) 04/24/2020 1108   CALCIUM 9.3 07/03/2017 0813   ALKPHOS 176 (H) 04/24/2020 1108   ALKPHOS 77 07/03/2017 0813   AST 15 04/24/2020 1108   AST 16 07/03/2017 0813   ALT 16 04/24/2020 1108   ALT 14 07/03/2017 0813   BILITOT 0.8 04/24/2020 1108   BILITOT 0.93 07/03/2017 0813     Results for CHRISTINO, MCGLINCHEY (MRN [id number]) as of 05/22/2020 09:26  Ref. Range 03/27/2020 11:30 04/24/2020 11:09  M Protein SerPl Elph-Mcnc Latest Ref Range: Not Observed g/dL Not Observed Not Observed  IFE 1 Unknown Comment Comment  Globulin, Total Latest Ref Range: 2.2 - 3.9 g/dL 2.4 1.9 (L)  B-Globulin SerPl Elph-Mcnc Latest Ref Range: 0.7 - 1.3 g/dL 0.7 0.6 (L)  IgG (Immunoglobin G), Serum Latest Ref Range: 603 - 1,613 mg/dL 585 (L) 566 (L)  IgM (Immunoglobulin M), Srm Latest Ref Range: 20 - 172 mg/dL 21 21  IgA Latest Ref Range: 90 - 386 mg/dL 36 (L) 31 (L)    Results for TOMOKI, LUCKEN (MRN [id number]) as of 05/22/2020 09:26  Ref. Range 03/27/2020 11:30 04/24/2020 11:08 04/24/2020 11:09  IgG (Immunoglobin G), Serum Latest Ref Range: 603 - 1,613 mg/dL 585 (L)   566 (L)  IgM (Immunoglobulin M), Srm Latest Ref Range: 20 - 172 mg/dL 21  21  IgA Latest Ref Range: 90 - 386 mg/dL 36 (L)  31 (L)  Kappa free light chain Latest Ref Range: 3.3 - 19.4 mg/L 123.7 (H) 120.8 (H)   Lamda free light chains Latest Ref Range: 5.7 - 26.3 mg/L 49.9 (H) 43.4 (H)   Kappa, lamda light chain ratio Latest Ref Range: 0.26 - 1.65  2.48 (H) 2.78 (H)      Impression and Plan:  54 year old man with:  1.  Multiple myeloma arising from MGUS diagnosed in 2019.  He has IgG kappa subtype.  The natural course of this disease was reviewed again and treatment options were reiterated.  Protein studies obtained on April 24, 2020 were reviewed and showed continued reasonable response to therapy with very minimal residual disease.  His M spike is no longer detected with IgG level that is decreased.  He still has elevated kappa to lambda ratio.  Risks and benefits of continuing daratumumab maintenance were discussed at this time.  Potential complications when infusion related complications among others were reviewed. He is agreeable to continue.  2. Anemia: Resolved at this time with hemoglobin back to normal range.  His anemia is related to GI blood losses, plasma cell disorder and renal insufficiency.   3.  Antiemetics: Compazine are available to him without any nausea or vomiting.  4.  Renal failure: He continues to be dialysis dependent without any recent issues.  5. Goals of care: Aggressive measures are warranted given his reasonable quality of life and performance status.   6. Follow-up: In 4 weeks for repeat follow-up.   30 minutes were dedicated to this visit.  The time spent on reviewing his disease status, reviewing laboratory data and outlining future plan of care.   Zola Button, MD 9/14/20219:24 AM

## 2020-05-22 NOTE — Patient Instructions (Signed)
[facility] Discharge Instructions for Patients Receiving Chemotherapy  Today you received the following chemotherapy agents: Darzalex  To help prevent nausea and vomiting after your treatment, we encourage you to take your nausea medication as directed.    If you develop nausea and vomiting that is not controlled by your nausea medication, call the clinic.   BELOW ARE SYMPTOMS THAT SHOULD BE REPORTED IMMEDIATELY:  *FEVER GREATER THAN 100.5 F  *CHILLS WITH OR WITHOUT FEVER  NAUSEA AND VOMITING THAT IS NOT CONTROLLED WITH YOUR NAUSEA MEDICATION  *UNUSUAL SHORTNESS OF BREATH  *UNUSUAL BRUISING OR BLEEDING  TENDERNESS IN MOUTH AND THROAT WITH OR WITHOUT PRESENCE OF ULCERS  *URINARY PROBLEMS  *BOWEL PROBLEMS  UNUSUAL RASH Items with * indicate a potential emergency and should be followed up as soon as possible.  Feel free to call the clinic should you have any questions or concerns. The clinic phone number is [phone].  Please show the [facility] at check-in to the Emergency Department and triage nurse.

## 2020-05-23 DIAGNOSIS — E1129 Type 2 diabetes mellitus with other diabetic kidney complication: Secondary | ICD-10-CM | POA: Diagnosis not present

## 2020-05-23 DIAGNOSIS — N186 End stage renal disease: Secondary | ICD-10-CM | POA: Diagnosis not present

## 2020-05-23 DIAGNOSIS — Z992 Dependence on renal dialysis: Secondary | ICD-10-CM | POA: Diagnosis not present

## 2020-05-23 DIAGNOSIS — D509 Iron deficiency anemia, unspecified: Secondary | ICD-10-CM | POA: Diagnosis not present

## 2020-05-23 DIAGNOSIS — N2581 Secondary hyperparathyroidism of renal origin: Secondary | ICD-10-CM | POA: Diagnosis not present

## 2020-05-23 DIAGNOSIS — Z23 Encounter for immunization: Secondary | ICD-10-CM | POA: Diagnosis not present

## 2020-05-23 LAB — KAPPA/LAMBDA LIGHT CHAINS
Kappa free light chain: 132.7 mg/L — ABNORMAL HIGH (ref 3.3–19.4)
Kappa, lambda light chain ratio: 2.58 — ABNORMAL HIGH (ref 0.26–1.65)
Lambda free light chains: 51.5 mg/L — ABNORMAL HIGH (ref 5.7–26.3)

## 2020-05-23 NOTE — Therapy (Signed)
[place] [address] [place], [place], [address] Phone: [phone]   Fax:  [phone]  Physical Therapy Treatment  Patient Details  Name: Isaiah [name] [facility] MRN: [id number] Date of Birth: [birth year] Referring Provider (PT): Delice Lesch, MD   Encounter Date: 05/22/2020   PT End of Session - 05/22/20 1454    Visit Number 10    Number of Visits 17    Date for PT Re-Evaluation 65/03/54   90 day cert for 8-wk POC   Authorization Type Medicare    Progress Note Due on Visit 20    PT Start Time 6568    PT Stop Time 1530    PT Time Calculation (min) 43 min    Equipment Utilized During Treatment Gait belt    Activity Tolerance Patient tolerated treatment well;No increased pain    Behavior During Therapy [facility] for tasks assessed/performed;Flat affect           Past Medical History:  Diagnosis Date  . A-fib ([place])   . Anemia   . Asthma   . DM type 2 (diabetes mellitus, type 2) ([place]) 06/09/2019  . ESRD (end stage renal disease) on dialysis ([place])   . Essential hypertension 06/09/2019  . GIB (gastrointestinal bleeding)    Recurrent episodes- 09/2014, 09/2015 and 05/2016  . Gout   . History of recent blood transfusion 10/27/14   2 Units PRBC's  . Hyperkalemia 07/2011  . Multiple myeloma ([place])   . OSA on CPAP   . Pulmonary embolism ([place]) 07/2011; 09/27/2014   a. Bilat PE 07/2011 - unclear cause, tx with 6 months Coumadin.;   . Seizure disorder ([place]) 06/09/2019  . Sepsis ([place])   . Sickle cell-thalassemia disease ([place])    a. Sickle cell trait.  . Sleep apnea   . Stroke ([place]) 09/2015   R-MCA, L-MCA, PCA and bilateral cerebellar complicated by DVT/PE  . Subdural hematoma ([place]) 05/2019    Past Surgical History:  Procedure Laterality Date  . AV FISTULA PLACEMENT Right 12/05/2015   Procedure: INSERTION OF ARTERIOVENOUS (AV) GORE-TEX GRAFT ARM;  Surgeon: Elam Dutch, MD;  Location: [facility];  Service: Vascular;  Laterality:  Right;  . BONE MARROW BIOPSY    . CATARACT EXTRACTION  08/2019  . CATARACT EXTRACTION  09/2019  . CHOLECYSTECTOMY    . CHOLECYSTECTOMY  1990's?  . COLONOSCOPY WITH PROPOFOL N/A 01/22/2017   Procedure: COLONOSCOPY WITH PROPOFOL;  Surgeon: Wilford Corner, MD;  [facility];  Service: Endoscopy;  Laterality: N/A;  . EYE SURGERY Right   . INSERTION OF DIALYSIS CATHETER Right 10/16/2015   Procedure: INSERTION OF PALINDROME DIALYSIS CATHETER ;  Surgeon: Elam Dutch, MD;  Location: [facility];  Service: Vascular;  Laterality: Right;  . OTHER SURGICAL HISTORY     Retinal surgery  . PARS PLANA VITRECTOMY  02/17/2012   Procedure: PARS PLANA VITRECTOMY WITH 25 GAUGE;  Surgeon: Hayden Pedro, MD;  Location: [facility];  Service: Ophthalmology;  Laterality: Right;  . PERIPHERAL VASCULAR CATHETERIZATION N/A 03/20/2016   Procedure: A/V Shuntogram/Fistulagram;  Surgeon: Conrad Red Oak, MD;  Location: [facility];  Service: Cardiovascular;  Laterality: N/A;  . TEE WITHOUT CARDIOVERSION N/A 12/02/2016   Procedure: TRANSESOPHAGEAL ECHOCARDIOGRAM (TEE);  Surgeon: Larey Dresser, MD;  Location: [facility];  Service: Cardiovascular;  Laterality: N/A;    There were no vitals filed for this visit.   Subjective Assessment - 05/22/20 1453    Subjective Had an infusion treatment  morning. Tired today. Reports straining his neck with balance ex's last session, feeling better today. No falls or pain to report.    Pertinent History multiple myeloma, ESRD on dialysis, DM type II, DVT with IVC filter, CVA, COPD, a-fib; chemo treatments down to 1x/month (next is August 17th)    Patient Stated Goals To get back to walk without the walker, back to his baseline    Currently in Pain? No/denies    Pain Score 0-No pain               [facility] Adult PT Treatment/Exercise - 05/22/20 1504      Transfers   Transfers Sit to Stand;Stand to Sit    Sit to Stand 4: Min guard;With upper extremity assist;From  chair/3-in-1;From bed    Stand to Sit 4: Min guard;With upper extremity assist;To bed;To chair/3-in-1;Uncontrolled descent      Ambulation/Gait   Ambulation/Gait Yes    Ambulation/Gait Assistance 4: Min guard;4: Min assist    Ambulation/Gait Assistance Details cues needed to stay within walker and to avoid obtacles with pt self correcting direction of walker when needed. 2 episodes of increased lateral weight shifitng with mild balance loss needing min assist to correct    Ambulation Distance (Feet) 80 Feet   x2, plus around gym with session   Assistive device Rolling walker    Gait Pattern Step-through pattern;Ataxic;Trunk flexed;Poor foot clearance - left;Poor foot clearance - right    Ambulation Surface Level;Indoor      High Level Balance   High Level Balance Activities Negotitating around obstacles    High Level Balance Comments with RW weaving around 4 cones with 180* turn at end for 2 laps with min guard to min assist due to left lateral balance loss x2.               Balance Exercises - 05/22/20 1512      Balance Exercises: Standing   Standing Eyes Closed Narrow base of support (BOS);Solid surface;3 reps;20 secs;Limitations    Standing Eyes Closed Limitations on floor with sturdy surface in front with feet together, 30 sec's x 3 reps. min assist with increased lateral sway noted.     SLS with Vectors Solid surface;Upper extremity assist 2;Other reps (comment);Limitations    SLS with Vectors Limitations with RW support- alternating fwd foot taps, then cross foot taps to 6 inch box for 10 reps each/bil LE's, cues for increased hip/knee flexion and to not slide foot off box. min guard to min assit for balance.     Stepping Strategy Anterior;Posterior;UE support;10 reps;Limitations    Stepping Strategy Limitations with RW support with black foam beam on floor- alternating fwd/bwd stepping over beam for 10 reps each side.     Partial Tandem Stance Eyes open;1 rep;30  secs;Limitations    Partial Tandem Stance Limitations on floor with no UE support with up to min assist for balance, 1 rep each foot forward. increased posterior sway noted.                PT Short Term Goals - 05/08/20 1502      PT SHORT TERM GOAL #1   Title Pt will perform HEP independenlty for improved strength, balance and gait.  TARGET 04/27/2020    Baseline performs, but needs supervision for safety in standing    Time 4    Period Weeks    Status Partially Met      PT SHORT TERM GOAL #2   Title Pt will improve  Berg Score to at least 23/56 for decreased fall risk.    Baseline 13/56 03/29/2020; 21/56 05/03/2020    Time 4    Period Weeks    Status Partially Met      PT SHORT TERM GOAL #3   Title Pt will improve TUG score to less than or equal to 40 seconds for decreased fall risk.    Baseline 52.25 sec 03/29/2020; 41.75 at best 05/03/2020    Time 4    Period Weeks    Status Partially Met      PT SHORT TERM GOAL #4   Title Pt will perform at least 3 minutes of standing with UE support and supervision for imrpoved standing tolerance for participation in ADLs.    Baseline 54 sec, 36 sec min guard/close supervision; 5:30 standing 05/03/2020    Time 4    Period Weeks    Status Achieved      PT SHORT TERM GOAL #5   Title Pt will improve gait velocity to at least 1.3 ft/sec for improved gait efficiency and safety.    Baseline 1.03 ft/sec 05/03/2020    Time 4    Period Weeks    Status Not Met      PT SHORT TERM GOAL #6   Title Pt/wife will verbalize understanding of fall prevention in home environment.    Time 4    Period Weeks    Status Achieved             PT Long Term Goals - 05/18/20 1730      PT LONG TERM GOAL #1   Title Pt will be independent with progression of HEP for improved strength, blaance, gait.  TARGET 06/08/2020-extended due to delays in scheduling    Time 8    Period Weeks    Status New      PT LONG TERM GOAL #2   Title Pt will imrpove Berg  Balance score to at least 33/56 for decreased fall risk.    Time 8    Period Weeks    Status New      PT LONG TERM GOAL #3   Title Pt will improve TUG score to less than or equal to 30 seconds for decreased fall risk.    Time 8    Period Weeks    Status New      PT LONG TERM GOAL #4   Title Pt will improve gait velocity to at least 1.8 ft/sec for improved gait velocity and decreased fall risk.    Time 8    Period Weeks    Status New      PT LONG TERM GOAL #5   Title Pt will ascend/descent 12 steps with both handrails with supervision only, to negotiate steps at home.    Time 8    Period Weeks    Status New                 Plan - 05/22/20 1455    Clinical Impression Statement Today's skilled session continued to work gait with RW with emphasis on obstacle negotiation and balance reactions. Pt limited by fatigue this session due to infusion this morning, however still able to participate with PT. The pt is progressing toward goals and should benefit from continued PT to progress toward unmet goals.    Personal Factors and Comorbidities Comorbidity 3+    Comorbidities PMH:  multiple myeloma, ESRD on dialysis, DM type II, DVT with IVC filter, CVA, COPD, a-fib  Examination-Activity Limitations Locomotion Level;Transfers;Stairs;Stand    Examination-Participation Restrictions Community Activity    Stability/Clinical Decision Making Evolving/Moderate complexity    Rehab Potential Good    PT Frequency 2x / week    PT Duration 8 weeks   plus eval   PT Treatment/Interventions ADLs/Self Care Home Management;DME Instruction;Neuromuscular re-education;Balance training;Therapeutic exercise;Therapeutic activities;Functional mobility training;Stair training;Gait training;Patient/family education    PT Next Visit Plan Continue standing balance and standing strengthening; gait training with RW (obstacle negoitation);    PT Home Exercise Plan Access Code [email or web address]    Consulted and Agree  with Plan of Care Patient           Patient will benefit from skilled therapeutic intervention in order to improve the following deficits and impairments:  Abnormal gait, Difficulty walking, Decreased safety awareness, Decreased balance, Decreased mobility, Decreased strength  Visit Diagnosis: Unsteadiness on feet  Muscle weakness (generalized)  Other abnormalities of gait and mobility     Problem List Patient Active Problem List   Diagnosis Date Noted  . Colitis 03/15/2020  . AMS (altered mental status) 10/05/2019  . Acute metabolic encephalopathy 48/25/0037  . Headache, unspecified 09/28/2019  . Abnormality of gait 09/13/2019  . Diabetes mellitus with end-stage renal disease ([place]) 08/07/2019  . Benign hypertensive kidney disease with chronic kidney disease 08/07/2019  . Hypothyroidism, unspecified 07/15/2019  . Spastic hemiparesis ([place]) 07/12/2019  . Dysphagia, oropharyngeal phase 07/05/2019  . Allergy, unspecified, initial encounter 06/27/2019  . PAF (paroxysmal atrial fibrillation) ([place])   . Bacteremia   . Labile blood pressure   . Labile blood glucose   . Controlled type 2 diabetes mellitus with hyperglycemia, without long-term current use of insulin ([place])   . ESRD on dialysis ([place])   . Right sided weakness   . SDH (subdural hematoma) ([place])   . Chronic intracranial subdural hematoma ([facility]) 06/09/2019  . Multiple myeloma not having achieved remission ([place]) 06/09/2019  . ESRD on hemodialysis ([place]) 06/09/2019  . Essential hypertension 06/09/2019  . DM type 2 (diabetes mellitus, type 2) ([place]) 06/09/2019  . Seizure disorder ([place]) 06/09/2019  . Aspiration pneumonia ([place]) 06/09/2019  . History of bacteremia 06/09/2019  . Atrial fibrillation, chronic ([place]) 06/09/2019  . Bilateral kidney stones 05/31/2019  . Proteus infection 05/28/2019  . History of restrictive pulmonary disease 04/07/2019  . Multiple myeloma ([place]) 05/17/2018  . Goals of care, counseling/discussion  05/17/2018  . Partial idiopathic epilepsy with seizures of localized onset, intractable, without status epilepticus ([place]) 11/02/2017  . Iron deficiency anemia, unspecified 01/22/2017  . COPD (chronic obstructive pulmonary disease) ([place]) 11/11/2016  . CVA (cerebral vascular accident) ([place]) 11/11/2016  . GERD (gastroesophageal reflux disease) 11/11/2016  . GI bleed 11/11/2016  . 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  . Obesity (BMI 30-39.9) 11/11/2016  . Pulmonary hypertension ([place]) 11/11/2016  . Patent foramen ovale 11/11/2016  . Hyperparathyroidism, secondary renal ([place]) 11/11/2016  . VTE (venous thromboembolism) 11/11/2016  . Hypotension 11/11/2016  . Systemic hypertension 11/11/2016  . Seizure ([place]) 11/11/2016  . Other seizures ([place]) 07/25/2016  . Encounter for removal of sutures 05/27/2016  . Ingrown nail 03/03/2016  . Onychomycosis due to dermatophyte 03/03/2016  . Other acquired hammer toe 03/03/2016  . Hemiparesis affecting right side as late effect of cerebrovascular accident ([place]) 02/28/2016  . Aphasia complicating stroke 04/88/8916  . Seizures ([place]) 01/08/2016  . History of ischemic left MCA stroke 01/08/2016  . Right sided weakness 01/08/2016  . Atrial fibrillation ([place]) 01/08/2016  . Leukocytosis 01/08/2016  . Benign essential  HTN 01/08/2016  . Anemia of chronic disease 01/08/2016  . Controlled type 2 diabetes mellitus with diabetic nephropathy, without long-term current use of insulin ([place]) 01/08/2016  . History of DVT (deep vein thrombosis) 01/08/2016  . 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 Hypocalcemia 12/26/2015  . Long term (current) use of anticoagulants 11/07/2015  . Aftercare including intermittent dialysis ([place]) 11/02/2015  . Other specified coagulation defects ([place]) 11/02/2015  . Complication of vascular dialysis catheter 11/02/2015  . Diarrhea, unspecified 11/02/2015  . Fever, unspecified 11/02/2015  . Pruritus, unspecified 11/02/2015  .  Shortness of breath 11/02/2015  . ESRD (end stage renal disease) on dialysis ([place])     Willow Ora, PTA, [place] [address], [place], [place] [address] [phone] 05/23/20, 1:59 PM   Name: Jance Siek [facility] MRN: [id number] Date of Birth: 1966/03/01

## 2020-05-24 ENCOUNTER — Ambulatory Visit: Payer: Self-pay

## 2020-05-24 ENCOUNTER — Ambulatory Visit: Payer: Medicare Other | Admitting: Physical Therapy

## 2020-05-24 DIAGNOSIS — N186 End stage renal disease: Secondary | ICD-10-CM

## 2020-05-24 DIAGNOSIS — C9000 Multiple myeloma not having achieved remission: Secondary | ICD-10-CM

## 2020-05-24 DIAGNOSIS — E1122 Type 2 diabetes mellitus with diabetic chronic kidney disease: Secondary | ICD-10-CM

## 2020-05-24 LAB — MULTIPLE MYELOMA PANEL, SERUM
Albumin SerPl Elph-Mcnc: 4.4 g/dL (ref 2.9–4.4)
Albumin/Glob SerPl: 1.8 — ABNORMAL HIGH (ref 0.7–1.7)
Alpha 1: 0.3 g/dL (ref 0.0–0.4)
Alpha2 Glob SerPl Elph-Mcnc: 0.8 g/dL (ref 0.4–1.0)
B-Globulin SerPl Elph-Mcnc: 0.8 g/dL (ref 0.7–1.3)
Gamma Glob SerPl Elph-Mcnc: 0.6 g/dL (ref 0.4–1.8)
Globulin, Total: 2.5 g/dL (ref 2.2–3.9)
IgA: 35 mg/dL — ABNORMAL LOW (ref 90–386)
IgG (Immunoglobin G), Serum: 607 mg/dL (ref 603–1613)
IgM (Immunoglobulin M), Srm: 22 mg/dL (ref 20–172)
Total Protein ELP: 6.9 g/dL (ref 6.0–8.5)

## 2020-05-24 NOTE — Chronic Care Management (AMB) (Signed)
Chronic Care Management    Social Work Follow Up Note  05/24/2020 Name: Ruby Dilone [facility] MRN: [id number] DOB: Feb 02, 1966  TIMOFEY CARANDANG is a 54 y.o. year old male who is a primary care patient of [name], MD. The CCM team was consulted for assistance with care coordination.   Review of patient status, including review of consultants reports, other relevant assessments, and collaboration with appropriate care team members and the patient's provider was performed as part of comprehensive patient evaluation and provision of chronic care management services.    SDOH (Social Determinants of Health) assessments performed: No    Outpatient Encounter Medications as of 05/24/2020  Medication Sig  . acetaminophen (TYLENOL) 325 MG tablet Take 1-2 tablets (325-650 mg total) by mouth every 4 (four) hours as needed for mild pain.  [name] acyclovir (ZOVIRAX) 400 MG tablet TAKE 1 TABLET BY MOUTH ONCE DAILY  . albuterol (PROAIR HFA) 108 (90 Base) MCG/ACT inhaler Inhale two puffs every 4-6 hours if needed for cough or wheeze (Patient taking differently: Inhale 2 puffs into the lungs every 4 (four) hours as needed for wheezing or shortness of breath. Inhale two puffs every 4-6 hours if needed for cough or wheeze)  . Alcohol Swabs (SM ALCOHOL PREP) 70 % PADS   . atorvastatin (LIPITOR) 20 MG tablet TAKE 1 TABLET BY MOUTH ONCE A DAY  . AURYXIA 1 GM 210 MG(Fe) tablet Take 210 mg by mouth 3 (three) times daily.  [name] BREO ELLIPTA 200-25 MCG/INH AEPB INHALE 1 PUFF BY MOUTH AT THE SAME TIME EVERY DAY (Patient taking differently: Inhale 1 puff into the lungs daily. )  . brimonidine (ALPHAGAN) 0.2 % ophthalmic solution Place 1 drop into the right eye 2 (two) times daily.   . camphor-menthol (SARNA) lotion Apply 1 application topically every 8 (eight) hours as needed for itching.  . Cetirizine HCl 10 MG CAPS Take 1 capsule by mouth daily.   . clonazePAM (KLONOPIN) 0.5 MG tablet Take 2 tablets every Monday, Wednesday, and  Friday with hemodialysis (Patient taking differently: Take 1 mg by mouth every Monday, Wednesday, and Friday. Before dialysis)  . FREESTYLE LITE test strip   . hydrOXYzine (ATARAX/VISTARIL) 25 MG tablet TAKE 1 TABLET (25 MG TOTAL) BY MOUTH EVERY 8 (EIGHT) HOURS AS NEEDED FOR ITCHING.  . Insulin Glargine (BASAGLAR KWIKPEN) 100 UNIT/ML Inject 10 Units into the skin daily.   . Insulin Lispro (HUMALOG KWIKPEN [place]) Inject 2-4 Units into the skin 3 (three) times daily before meals. Sliding scale: 150-250 =2 units, 250 -350=3 units, 350 += 4 units  . lacosamide (VIMPAT) 200 MG TABS tablet Take 1 tablet twice a day except on dialysis days M-W-F give 1 tab in AM, 1 tab after dialysis, 1 tab in PM.  . lamoTRIgine (LAMICTAL) 200 MG tablet Take 1 tablet (200 mg total) by mouth 2 (two) times daily.  . Lancets (FREESTYLE) lancets   . metoprolol tartrate (LOPRESSOR) 25 MG tablet Take 25 mg by mouth daily.  . metroNIDAZOLE (FLAGYL) 250 MG tablet Take 1 tablet (250 mg total) by mouth every 8 (eight) hours.  . midodrine (PROAMATINE) 10 MG tablet TAKE 1 TABLET BY MOUTH TWICE DAILY WITH MEALS  . multivitamin (RENA-VIT) TABS tablet Take 1 tablet by mouth at bedtime.  . Nutritional Supplements (FEEDING SUPPLEMENT, NEPRO CARB STEADY,) LIQD Take 237 mLs by mouth 3 (three) times daily as needed (Supplement).  . pantoprazole (PROTONIX) 40 MG tablet TAKE 1 TABLET BY MOUTH ONCE DAILY  . prochlorperazine (COMPAZINE)  10 MG tablet TAKE 1 TABLET (10 MG TOTAL) BY MOUTH EVERY 6 (SIX) HOURS AS NEEDED FOR NAUSEA OR VOMITING.  [name] UNIFINE PENTIPS 31G X 8 MM MISC USE AS DIRECTED DAILY AFTER SUPPER   No facility-administered encounter medications on file as of 05/24/2020.     Goals Addressed            This Visit's Progress   . Collaborate with RN Care Manager to perform apprpriate assessments to determine care management and care coordination needs       Current Barriers:  . Care management and care coordination needs related to  DMII, ESRD, and multiple myeloma  which resulted in inpatient hospitalization . ADL IADL limitations  Clinical Social Work Clinical Goal(s):  [name] Over the next 60 days, patient will collaborate with care management team to assist with care coordination needs in response to recent inpatient hospitalization . New 01/16/20- Over the next 20 days the patient and his spouse will work with SW to identify cause of Medicaid denial and initiate an appeal Goal Met . New 01/25/20 Over the next 60 days the patient and his wife will work with SW to address ongoing care coordination needs related to caregiver assistance Goal not met due to challenges with COVID 19 pandemic . New 05/24/20 Over the next week the patient will complete CAP/DA telephonic assessment  CCM SW Interventions: Completed 05/24/20 with [facility] . Inbound call received from [facility] who reports the patient has a scheduled virtual assessment with the CAP/DA Nurse and Social worker on September 23rd . Discussed the patient has yet to be informed whether or not he is approved for Medicaid . Assessed for acute care coordination needs-  no challenges noted at this time . Encouraged Mrs. Matthew to contact SW directly with outcome of telephonic assessments . Scheduled follow up call over the next month  Completed 05/02/20 with [facility] . Successful outbound call placed to Mrs. Lamora to assess goal progression . Determined the patient has yet to have a CAP nursing assessment or receive a determination from [place] o Mrs. Ton reports she is frustrated with this process and may not proceed if the application needs to be re-submitted again as it did in the past . Discussed barriers to caregiver resources due to ongoing pandemic . Encouraged Mrs. Unrein to contact SW as needed with ongoing care coordination needs . Goal closed  Completed 04/05/20 with [facility] . Successful outbound call placed to the patients spouse to assess goal  progression  . Informed by Mrs. Bazzano the patient was just seen by Dr. Coralyn Pear to address a bleed behind the patients right eye o Patient received an injections and prescription for eye drops o Instructed to return in 1 week for a re-check o Collaboration with RN Care Manager regarding recent update to patients health history . Determined Mrs. Coggins was contacted by Mrs. Larence Penning with the in home aide office requesting a new Medicaid application be completed o Mrs. Veno completed this application and submitted to Mrs. Larence Penning approximately 1 week ago . Discussed concern with staffing challenges which is delaying patients CAP assessment . Determined mrs. [facility] has yet to be told when she will need to return to the office setting for full-time hours but she is concerned she may not be able to continue remote work indefinitely to assist with patient care needs . Scheduled follow up call over the next month to assess goal progression o Encouraged Mrs. Wonnacott to contact SW and  or RN Care Manager as needed prior to next scheduled call  Completed 03/20/20 . Successful outbound call placed to [facility] in response to voice message received . Mrs. Bade reports she is still awaiting assessment for CAP services . Mrs. Scarfo was contacted by Mrs. Larence Penning with the CAP office who stated they are short staffed but are hoping to be able to complete assessment in the coming weeks  Completed 02/29/20 with [facility] . Successful outbound call placed to the patients spouse to review goal progression . Determined Mrs. [facility] has been in contact with Ilda Mori (contact made on 6/22)  who has assigned a nurse to perform an assessment to determine patient eligibility for CAP/DA services o Mrs. Larence Penning informed Mrs. Swoveland to expect a call over the next 10 days to schedule assessment . Encouraged Mrs. Stecklein to contact SW with outcome of assessment and/or if further SW assistance is needed  . Scheduled follow up  call over the next 6 weeks   Patient Self Care Activities:  . Attends all scheduled provider appointments . Calls pharmacy for medication refills . Calls provider office for new concerns or questions . Supportive wife and caregiver to assist with patient care needs   Please see past updates related to this goal by clicking on the "Past Updates" button in the selected goal         Follow Up Plan: SW will follow up with patient by phone over the next month.   [name], BSW, CDP Social Worker, Certified Dementia Practitioner [place] [facility] [place] [facility] [phone]  Total time spent performing care coordination and/or care management activities with the patient by phone or face to face = 7 minutes.

## 2020-05-24 NOTE — Patient Instructions (Signed)
Social Worker Visit Information  Goals we discussed today:  Goals Addressed            This Visit's Progress   . Collaborate with RN Care Manager to perform apprpriate assessments to determine care management and care coordination needs       Current Barriers:  . Care management and care coordination needs related to DMII, ESRD, and multiple myeloma  which resulted in inpatient hospitalization . ADL IADL limitations  Clinical Social Work Clinical Goal(s):  [name] Over the next 60 days, patient will collaborate with care management team to assist with care coordination needs in response to recent inpatient hospitalization . New 01/16/20- Over the next 20 days the patient and his spouse will work with SW to identify cause of Medicaid denial and initiate an appeal Goal Met . New 01/25/20 Over the next 60 days the patient and his wife will work with SW to address ongoing care coordination needs related to caregiver assistance Goal not met due to challenges with COVID 19 pandemic . New 05/24/20 Over the next week the patient will complete CAP/DA telephonic assessment  CCM SW Interventions: Completed 05/24/20 with [facility] . Inbound call received from [facility] who reports the patient has a scheduled virtual assessment with the CAP/DA Nurse and Social worker on September 23rd . Discussed the patient has yet to be informed whether or not he is approved for Medicaid . Assessed for acute care coordination needs-  no challenges noted at this time . Encouraged Mrs. Severt to contact SW directly with outcome of telephonic assessments . Scheduled follow up call over the next month  Completed 05/02/20 with [facility] . Successful outbound call placed to Mrs. Mowrey to assess goal progression . Determined the patient has yet to have a CAP nursing assessment or receive a determination from [place] o Mrs. Ludden reports she is frustrated with this process and may not proceed if the application needs to be  re-submitted again as it did in the past . Discussed barriers to caregiver resources due to ongoing pandemic . Encouraged Mrs. Pasch to contact SW as needed with ongoing care coordination needs . Goal closed  Completed 04/05/20 with [facility] . Successful outbound call placed to the patients spouse to assess goal progression  . Informed by Mrs. Dault the patient was just seen by Dr. Coralyn Pear to address a bleed behind the patients right eye o Patient received an injections and prescription for eye drops o Instructed to return in 1 week for a re-check o Collaboration with RN Care Manager regarding recent update to patients health history . Determined Mrs. Maslow was contacted by Mrs. Larence Penning with the in home aide office requesting a new Medicaid application be completed o Mrs. Finamore completed this application and submitted to Mrs. Larence Penning approximately 1 week ago . Discussed concern with staffing challenges which is delaying patients CAP assessment . Determined m[facility]. [facility] has yet to be told when she will need to return to the office setting for full-time hours but she is concerned she may not be able to continue remote work indefinitely to assist with patient care needs . Scheduled follow up call over the next month to assess goal progression o Encouraged Mrs. Cordner to contact SW and or Consulting civil engineer as needed prior to next scheduled call  Completed 03/20/20 . Successful outbound call placed to [facility] in response to voice message received . Mrs. Roarty reports she is still awaiting assessment for CAP services . Mrs.  Fuentes was contacted by Mrs. Larence Penning with the CAP office who stated they are short staffed but are hoping to be able to complete assessment in the coming weeks  Completed 02/29/20 with [facility] . Successful outbound call placed to the patients spouse to review goal progression . Determined Mrs. [facility] has been in contact with Ilda Mori (contact made on 6/22)  who has  assigned a nurse to perform an assessment to determine patient eligibility for CAP/DA services o Mrs. Larence Penning informed Mrs. Dearinger to expect a call over the next 10 days to schedule assessment . Encouraged Mrs. Tackitt to contact SW with outcome of assessment and/or if further SW assistance is needed  . Scheduled follow up call over the next 6 weeks   Patient Self Care Activities:  . Attends all scheduled provider appointments . Calls pharmacy for medication refills . Calls provider office for new concerns or questions . Supportive wife and caregiver to assist with patient care needs   Please see past updates related to this goal by clicking on the "Past Updates" button in the selected goal         Follow Up Plan: SW will follow up with patient by phone over the next month.   [name], BSW, CDP Social Worker, Certified Dementia Practitioner [facility] [phone]

## 2020-05-25 ENCOUNTER — Telehealth: Payer: Medicare Other

## 2020-05-25 DIAGNOSIS — D509 Iron deficiency anemia, unspecified: Secondary | ICD-10-CM | POA: Diagnosis not present

## 2020-05-25 DIAGNOSIS — E1129 Type 2 diabetes mellitus with other diabetic kidney complication: Secondary | ICD-10-CM | POA: Diagnosis not present

## 2020-05-25 DIAGNOSIS — Z23 Encounter for immunization: Secondary | ICD-10-CM | POA: Diagnosis not present

## 2020-05-25 DIAGNOSIS — Z992 Dependence on renal dialysis: Secondary | ICD-10-CM | POA: Diagnosis not present

## 2020-05-25 DIAGNOSIS — N186 End stage renal disease: Secondary | ICD-10-CM | POA: Diagnosis not present

## 2020-05-25 DIAGNOSIS — N2581 Secondary hyperparathyroidism of renal origin: Secondary | ICD-10-CM | POA: Diagnosis not present

## 2020-05-26 MED FILL — AURYXIA 210 MG TABLET: 1 GM | 30 days supply | Qty: 90 | Fill #5

## 2020-05-28 ENCOUNTER — Other Ambulatory Visit: Payer: Self-pay | Admitting: Internal Medicine

## 2020-05-28 ENCOUNTER — Ambulatory Visit: Payer: Medicare Other | Admitting: Neurology

## 2020-05-28 DIAGNOSIS — N2581 Secondary hyperparathyroidism of renal origin: Secondary | ICD-10-CM | POA: Diagnosis not present

## 2020-05-28 DIAGNOSIS — Z23 Encounter for immunization: Secondary | ICD-10-CM | POA: Diagnosis not present

## 2020-05-28 DIAGNOSIS — N186 End stage renal disease: Secondary | ICD-10-CM | POA: Diagnosis not present

## 2020-05-28 DIAGNOSIS — Z992 Dependence on renal dialysis: Secondary | ICD-10-CM | POA: Diagnosis not present

## 2020-05-28 DIAGNOSIS — E1129 Type 2 diabetes mellitus with other diabetic kidney complication: Secondary | ICD-10-CM | POA: Diagnosis not present

## 2020-05-28 DIAGNOSIS — D509 Iron deficiency anemia, unspecified: Secondary | ICD-10-CM | POA: Diagnosis not present

## 2020-05-28 MED FILL — clonazePAM 0.5 MG TABS: 0.5 | 35 days supply | Qty: 30 | Fill #4

## 2020-05-28 MED FILL — BREO ELLIPTA 200-25 MCG INH: 200-25 | 30 days supply | Qty: 60 | Fill #0

## 2020-05-28 MED FILL — VIMPAT 200 MG TABLET: 200 | 37 days supply | Qty: 90 | Fill #3

## 2020-05-28 MED FILL — CALCIUM ACETATE 667 MG CAP: 667 | 30 days supply | Qty: 390 | Fill #1

## 2020-05-29 ENCOUNTER — Other Ambulatory Visit: Payer: Self-pay | Admitting: Neurology

## 2020-05-29 ENCOUNTER — Ambulatory Visit (INDEPENDENT_AMBULATORY_CARE_PROVIDER_SITE_OTHER): Payer: Medicare Other | Admitting: Neurology

## 2020-05-29 ENCOUNTER — Other Ambulatory Visit: Payer: Self-pay

## 2020-05-29 ENCOUNTER — Encounter: Payer: Self-pay | Admitting: Neurology

## 2020-05-29 VITALS — BP 128/84 | HR 100 | Resp 18 | Ht 70.0 in | Wt 194.0 lb

## 2020-05-29 DIAGNOSIS — M79674 Pain in right toe(s): Secondary | ICD-10-CM | POA: Diagnosis not present

## 2020-05-29 DIAGNOSIS — R404 Transient alteration of awareness: Secondary | ICD-10-CM

## 2020-05-29 DIAGNOSIS — M2041 Other hammer toe(s) (acquired), right foot: Secondary | ICD-10-CM | POA: Diagnosis not present

## 2020-05-29 DIAGNOSIS — G40209 Localization-related (focal) (partial) symptomatic epilepsy and epileptic syndromes with complex partial seizures, not intractable, without status epilepticus: Secondary | ICD-10-CM | POA: Diagnosis not present

## 2020-05-29 DIAGNOSIS — B351 Tinea unguium: Secondary | ICD-10-CM | POA: Diagnosis not present

## 2020-05-29 DIAGNOSIS — M79675 Pain in left toe(s): Secondary | ICD-10-CM | POA: Diagnosis not present

## 2020-05-29 DIAGNOSIS — M2042 Other hammer toe(s) (acquired), left foot: Secondary | ICD-10-CM | POA: Diagnosis not present

## 2020-05-29 MED ORDER — CLONAZEPAM 0.5 MG PO TABS
ORAL_TABLET | ORAL | 5 refills | Status: DC
Start: 2020-05-29 — End: 2020-05-29

## 2020-05-29 MED ORDER — LAMOTRIGINE 200 MG PO TABS
200.0000 mg | ORAL_TABLET | Freq: Two times a day (BID) | ORAL | 11 refills | Status: DC
Start: 2020-05-29 — End: 2021-01-05

## 2020-05-29 MED ORDER — LACOSAMIDE 200 MG PO TABS
ORAL_TABLET | ORAL | 5 refills | Status: DC
Start: 2020-05-29 — End: 2020-05-29

## 2020-05-29 NOTE — Patient Instructions (Signed)
1. Continue all your medications  2. Continue to keep a calendar of symptoms. If they episodes start happening separate or after dialysis, we will plan to do inpatient EEG stay  3. Follow-up in 4 months, call for any changes   Seizure Precautions: 1. If medication has been prescribed for you to prevent seizures, take it exactly as directed.  Do not stop taking the medicine without talking to your doctor first, even if you have not had a seizure in a long time.   2. Avoid activities in which a seizure would cause danger to yourself or to others.  Don't operate dangerous machinery, swim alone, or climb in high or dangerous places, such as on ladders, roofs, or girders.  Do not drive unless your doctor says you may.  3. If you have any warning that you may have a seizure, lay down in a safe place where you can't hurt yourself.    4.  No driving for 6 months from last seizure, as per [facility].   Please refer to the following link on the [place] website for more information: [email or web address]   5.  Maintain good sleep hygiene.   6.  Contact your doctor if you have any problems that may be related to the medicine you are taking.  7.  Call 911 and bring the patient back to the ED if:        A.  The seizure lasts longer than 5 minutes.       B.  The patient doesn't awaken shortly after the seizure  C.  The patient has new problems such as difficulty seeing, speaking or moving  D.  The patient was injured during the seizure  E.  The patient has a temperature over 102 F (39C)  F.  The patient vomited and now is having trouble breathing

## 2020-05-29 NOTE — Progress Notes (Signed)
NEUROLOGY FOLLOW UP OFFICE NOTE  Shaul Trautman [facility] [id number] 1966-06-16  HISTORY OF PRESENT ILLNESS: I had the pleasure of seeing [facility] in follow-up in the neurology clinic on 05/29/2020.  The patient was last seen 4 months ago for seizures secondary to left MCA stroke in 6381, complicated by right subdural hematoma in 2020. He is again accompanied by his wife who helps supplement the history today.  Records and images were personally reviewed where available.  Since his last visit, he has not had the typical convulsive seizures occurring during hemodialysis since 05/2019 with addition of clonazepam 54m prior to HD. He is also on Lamotrigine 2068mBID and Vimpat 20061mID. His wife continues to report episodes where he is not responding well, unable to follow instructions to get out of bed. Episode captured on ambulatory EEG did not show any epileptiform correlate, BP not significantly low during events. Since his last visit, his wife reports episodes on 8/30, 9/13, 9/15, and 9/21. All of them have occurred as he is getting ready to go for HD. She brings a video, his eyes are open and have a blank look but he is answering questions but seems out of it and speech is slightly slurred. He is rocking side to side. Sometimes it appears like he is asleep, his head drops down and she asks him to wake up. He does not remember anything before that. His wife reports he would have no control over his legs and needs help to get into his wheelchair and go down the stairs. When she picks him up after dialysis, he would be back to baseline. He denies feeling confused during them, he says he remembers these and that his left leg feels weaker. He feels that cognition is clearer. He denies any headaches, dizziness, vision changes, focal numbness/tingling, no falls. He has a gait belt and his wife helps him with ambulation. Sleep is good.   History on Initial Assessment 10/29/2017: This is a pleasant [age] RH man with a  history of diabetes, sickle-cell thalassemia, DVT and PE off Coumadin due to GI bleed, sleep apnea, MGUS, CKD on dialysis, and strokes in multiple vascular distributions in January 2017 with subsequent seizures. Stroke workup showed left M2 superior branch occlusion, TEE with PFO, hypercoagulation studies negative. He is now on aspirin for secondary stroke prevention. He made significant improvement with right-sided weakness and speech was more fluent. He had a seizure in April 2017 and was started on Vimpat 100m56mD. EEG reported diffuse slowing. Limited MRI brain showed multiple late subacute and chronic infarcts, and repeat imaging in May 2017 showed resolution with no new infarct. [place] and was found to be anemic with Hgb of 6.8, requiring multiple blood transfusions. Vimpat increased to 150mg34m. EEG showed mild intermittent generalized theta slowing. He had another seizure after dialysis in November 2017 and Dilantin 300mg 57my was added. His wife did not want him to be on this long term, and was switched to Lamotrigine. He had another seizure in January 2018 a few days after Dilantin was tapered off. He had another seizure in October 2018 again after dialysis. The last seizure was on 10/19/17 again after dialysis. This was slightly different because he was belligerent and agitated and his wife had to calm him down. He is taking Vimpat 150mg B41mnd Lamotrigine 150mg BI63mth no side effects. He sometimes has a warning prior to the seizures. With  the most recent seizure, he was standing at the kitchen table, recalls his wife picking him up, then coming to on the sofa. Seizures last 5-10 minutes, his wife describes staring off, unresponsive, then trembling/low amplitude shaking. He is tired after, with worsened right-sided weakness. He would be "pretty out of it" the next day. Majority of seizures occur right after dialysis, he would still be  sitting in the dialysis unit, but the most recent seizure occurred at home an hour after dialysis. He reports BP is not an issue because his nephrologist has regulated his medications and BP is good most of the time.   He has occasional tremors in both legs, he has a right hand tremor noted in the office today. He noticed memory changes after his stroke, but does not think memory is worse after each seizure. He wonders about his short-term memory, sometimes he has to think which is his right or left side. He denies any olfactory/gustatory hallucinations, deja vu, rising epigastric sensation,myoclonic jerks.   Epilepsy Risk Factors:  Prior strokes in multiple vascular distributions. Otherwise he had a normal birth and early development.  There is no history of febrile convulsions, CNS infections such as meningitis/encephalitis, significant traumatic brain injury, neurosurgical procedures, or family history of seizures.  Diagnostic Data: I personally reviewed MRI brain without contrast done 01/2016 which showed left MCA infarct, right posterior temporal and occipital hemorrhagic infarct, left frontal infarct with remote blood products, remote hemorrhagic infarct in the more superior right frontal lobe, remote nonhemorrhagic lacunar infarcts in the left cerebellum.  48-hour EEG in Jan 2021 showed occasional left temporal slowing, no epileptiform discharges. Typical episode of confusion and weakness captured did not show epileptiform correlate.   Prior AEDs: Dilantin  PAST MEDICAL HISTORY: Past Medical History:  Diagnosis Date  . A-fib ([place])   . Anemia   . Asthma   . DM type 2 (diabetes mellitus, type 2) ([place]) 06/09/2019  . ESRD (end stage renal disease) on dialysis ([place])   . Essential hypertension 06/09/2019  . GIB (gastrointestinal bleeding)    Recurrent episodes- 09/2014, 09/2015 and 05/2016  . Gout   . History of recent blood transfusion 10/27/14   2 Units PRBC's  . Hyperkalemia 07/2011  .  Multiple myeloma ([place])   . OSA on CPAP   . Pulmonary embolism ([place]) 07/2011; 09/27/2014   a. Bilat PE 07/2011 - unclear cause, tx with 6 months Coumadin.;   . Seizure disorder ([place]) 06/09/2019  . Sepsis ([place])   . Sickle cell-thalassemia disease ([place])    a. Sickle cell trait.  . Sleep apnea   . Stroke ([place]) 09/2015   R-MCA, L-MCA, PCA and bilateral cerebellar complicated by DVT/PE  . Subdural hematoma ([place]) 05/2019    MEDICATIONS: Current Outpatient Medications on File Prior to Visit  Medication Sig Dispense Refill  . acetaminophen (TYLENOL) 325 MG tablet Take 1-2 tablets (325-650 mg total) by mouth every 4 (four) hours as needed for mild pain.    [name] acyclovir (ZOVIRAX) 400 MG tablet TAKE 1 TABLET BY MOUTH ONCE DAILY 90 tablet 0  . albuterol (PROAIR HFA) 108 (90 Base) MCG/ACT inhaler Inhale two puffs every 4-6 hours if needed for cough or wheeze (Patient taking differently: Inhale 2 puffs into the lungs every 4 (four) hours as needed for wheezing or shortness of breath. Inhale two puffs every 4-6 hours if needed for cough or wheeze) 6.7 g 2  . Alcohol Swabs (SM ALCOHOL PREP) 70 % PADS     .  atorvastatin (LIPITOR) 20 MG tablet TAKE 1 TABLET BY MOUTH ONCE A DAY 30 tablet 0  . AURYXIA 1 GM 210 MG(Fe) tablet Take 210 mg by mouth 3 (three) times daily.    [name] BREO ELLIPTA 200-25 MCG/INH AEPB INHALE 1 PUFF INTO THE LUNGS AT THE SAME TIME EVERY DAY 60 each 2  . brimonidine (ALPHAGAN) 0.2 % ophthalmic solution Place 1 drop into the right eye 2 (two) times daily.     . calcium acetate (PHOSLO) 667 MG capsule Take by mouth.    . camphor-menthol (SARNA) lotion Apply 1 application topically every 8 (eight) hours as needed for itching. 222 mL 0  . Cetirizine HCl 10 MG CAPS Take 1 capsule by mouth daily.     . Cinacalcet HCl (SENSIPAR PO) Take by mouth.    . clonazePAM (KLONOPIN) 0.5 MG tablet Take 2 tablets every Monday, Wednesday, and Friday with hemodialysis (Patient taking differently: Take 1 mg by  mouth every Monday, Wednesday, and Friday. Before dialysis) 30 tablet 5  . COSOPT 22.3-6.8 MG/ML ophthalmic solution Place 1 drop into the right eye 2 (two) times daily.    [name] FREESTYLE LITE test strip     . hydrOXYzine (ATARAX/VISTARIL) 25 MG tablet TAKE 1 TABLET (25 MG TOTAL) BY MOUTH EVERY 8 (EIGHT) HOURS AS NEEDED FOR ITCHING. 30 tablet 0  . Insulin Glargine (BASAGLAR KWIKPEN) 100 UNIT/ML Inject 10 Units into the skin daily.     . Insulin Lispro (HUMALOG KWIKPEN [place]) Inject 2-4 Units into the skin 3 (three) times daily before meals. Sliding scale: 150-250 =2 units, 250 -350=3 units, 350 += 4 units    . lacosamide (VIMPAT) 200 MG TABS tablet Take 1 tablet twice a day except on dialysis days M-W-F give 1 tab in AM, 1 tab after dialysis, 1 tab in PM. 90 tablet 5  . lamoTRIgine (LAMICTAL) 200 MG tablet Take 1 tablet (200 mg total) by mouth 2 (two) times daily. 60 tablet 11  . Lancets (FREESTYLE) lancets     . metoprolol tartrate (LOPRESSOR) 25 MG tablet Take 25 mg by mouth daily.    . metroNIDAZOLE (FLAGYL) 250 MG tablet Take 1 tablet (250 mg total) by mouth every 8 (eight) hours. 9 tablet 0  . midodrine (PROAMATINE) 10 MG tablet TAKE 1 TABLET BY MOUTH TWICE DAILY WITH MEALS 60 tablet 0  . multivitamin (RENA-VIT) TABS tablet Take 1 tablet by mouth at bedtime.    . Nutritional Supplements (FEEDING SUPPLEMENT, NEPRO CARB STEADY,) LIQD Take 237 mLs by mouth 3 (three) times daily as needed (Supplement). 237 mL 12  . pantoprazole (PROTONIX) 40 MG tablet TAKE 1 TABLET BY MOUTH ONCE DAILY 90 tablet 1  . prochlorperazine (COMPAZINE) 10 MG tablet TAKE 1 TABLET (10 MG TOTAL) BY MOUTH EVERY 6 (SIX) HOURS AS NEEDED FOR NAUSEA OR VOMITING. 30 tablet 0  . UNIFINE PENTIPS 31G X 8 MM MISC USE AS DIRECTED DAILY AFTER SUPPER 100 each 3   No current facility-administered medications on file prior to visit.    ALLERGIES: Allergies  Allergen Reactions  . Codeine Swelling  . Codeine Rash and Other (See Comments)     Unknown reaction (patient says it was more serious than just a rash, but he can't remember what happened)     FAMILY HISTORY: Family History  Problem Relation Age of Onset  . Hypertension Mother   . Hypertension Father   . Stroke Father   . Sickle cell anemia Brother   . Diabetes Mother   .  Sickle cell anemia Brother   . Diabetes type I Brother   . Kidney disease Brother     SOCIAL HISTORY: Social History   Socioeconomic History  . Marital status: Married    Spouse name: sylvia  . Number of children: 1  . Years of education: college  . Highest education level: Not on file  Occupational History  . Occupation: [employer]  . Occupation: disability  Tobacco Use  . Smoking status: Never Smoker  . Smokeless tobacco: Never Used  Vaping Use  . Vaping Use: Never used  Substance and Sexual Activity  . Alcohol use: Not Currently  . Drug use: Never  . Sexual activity: Yes    Partners: Female  Other Topics Concern  . Not on file  Social History Narrative   ** Merged History Encounter **       Pt lives in 2 story home with his wife and 1 son   Has masters degree in psychology   Currently disabled.   Right handed       Social Determinants of Health   Financial Resource Strain: Low Risk   . Difficulty of Paying Living Expenses: Not hard at all  Food Insecurity: No Food Insecurity  . Worried About Charity fundraiser in the Last Year: Never true  . Ran Out of Food in the Last Year: Never true  Transportation Needs: No Transportation Needs  . Lack of Transportation (Medical): No  . Lack of Transportation (Non-Medical): No  Physical Activity: Insufficiently Active  . Days of Exercise per Week: 4 days  . Minutes of Exercise per Session: 30 min  Stress: No Stress Concern Present  . Feeling of Stress : Not at all  Social Connections:   . Frequency of Communication with Friends and Family: Not on file  . Frequency of Social Gatherings with Friends and Family:  Not on file  . Attends Religious Services: Not on file  . Active Member of Clubs or Organizations: Not on file  . Attends Archivist Meetings: Not on file  . Marital Status: Not on file  Intimate Partner Violence:   . Fear of Current or Ex-Partner: Not on file  . Emotionally Abused: Not on file  . Physically Abused: Not on file  . Sexually Abused: Not on file     PHYSICAL EXAM: Vitals:   05/29/20 1352  BP: 128/84  Pulse: 100  Resp: 18  SpO2: 98%   General: No acute distress Head:  Normocephalic/atraumatic Skin/Extremities: No rash, no edema Neurological Exam: alert and oriented to person, place, and month/year. Mild aphasia with word-finding difficulty. No dysarthria today. Fund of knowledge is appropriate.  Recent and remote memory are intact. 3/3 delayed recall.  Attention and concentration are reduced.  MMSE - Mini Mental State Exam 05/30/2020  Orientation to time 3  Orientation to Place 5  Registration 3  Attention/ Calculation 1  Recall 3  Language- name 2 objects 2  Language- repeat 0  Language- follow 3 step command 3  Language- read & follow direction 1  Write a sentence - (difficulty writing due to right-sided weakness)  Copy design 0  Total score 21/29   Cranial nerves: Pupils equal, round. Extraocular movements intact with no nystagmus. Visual fields full.  No facial asymmetry.  Motor: right hand spasticity. Muscle strength 5/5 throughout except for right hand weakness.   Finger to nose testing intact.  Gait slow and cautious.   IMPRESSION: This is a pleasant 54 yo RH  man with a history of  diabetes, sickle-cell thalassemia, DVT and PE off Coumadin due to GI bleed, sleep apnea, CKD on dialysis, strokes in multiple vascular distributions in January 2017 with subsequent seizures. He had a complicated course while preparing for stem cell transplant for multiple myeloma, found to have a nontraumatic right subdural hematoma in 05/2019. The convulsive seizures  occurring during dialysis have not occurred since clonazepam 72m prior to HD was started in 05/2019. His wife continues to report transient episodes of confusion that appear to be metabolic, usually occurring prior to his dialysis sessions, more on Mondays (no HD for 2 days, gets HD MWF). Continue to monitor, if episodes occur after HD or at different times, would do inpatient EMU monitoring to classify events. Continue Lamotrigine 2046mBID and Vimpat 20048mID. Continue close supervision.He does not drive. Follow-up in 4 months, they know to call for any changes.    Thank you for allowing me to participate in his care.  Please do not hesitate to call for any questions or concerns.   KarEllouise Newer.D.   CC: Dr. SanBaird [facility]

## 2020-05-30 DIAGNOSIS — Z992 Dependence on renal dialysis: Secondary | ICD-10-CM | POA: Diagnosis not present

## 2020-05-30 DIAGNOSIS — D509 Iron deficiency anemia, unspecified: Secondary | ICD-10-CM | POA: Diagnosis not present

## 2020-05-30 DIAGNOSIS — E1129 Type 2 diabetes mellitus with other diabetic kidney complication: Secondary | ICD-10-CM | POA: Diagnosis not present

## 2020-05-30 DIAGNOSIS — Z23 Encounter for immunization: Secondary | ICD-10-CM | POA: Diagnosis not present

## 2020-05-30 DIAGNOSIS — N186 End stage renal disease: Secondary | ICD-10-CM | POA: Diagnosis not present

## 2020-05-30 DIAGNOSIS — E109 Type 1 diabetes mellitus without complications: Secondary | ICD-10-CM | POA: Diagnosis not present

## 2020-05-30 DIAGNOSIS — N2581 Secondary hyperparathyroidism of renal origin: Secondary | ICD-10-CM | POA: Diagnosis not present

## 2020-05-31 ENCOUNTER — Ambulatory Visit: Payer: Medicare Other

## 2020-05-31 ENCOUNTER — Other Ambulatory Visit: Payer: Self-pay

## 2020-05-31 DIAGNOSIS — S065X9A Traumatic subdural hemorrhage with loss of consciousness of unspecified duration, initial encounter: Secondary | ICD-10-CM

## 2020-05-31 DIAGNOSIS — R2681 Unsteadiness on feet: Secondary | ICD-10-CM | POA: Diagnosis not present

## 2020-05-31 DIAGNOSIS — M6281 Muscle weakness (generalized): Secondary | ICD-10-CM | POA: Diagnosis not present

## 2020-05-31 DIAGNOSIS — S065XAA Traumatic subdural hemorrhage with loss of consciousness status unknown, initial encounter: Secondary | ICD-10-CM

## 2020-05-31 DIAGNOSIS — R2689 Other abnormalities of gait and mobility: Secondary | ICD-10-CM

## 2020-05-31 NOTE — Therapy (Signed)
[facility] [address] [place], [place], [address] Phone: [phone]   Fax:  [phone]  Physical Therapy Progress Note/Recertification Note  Patient Details  Name: Isaiah [name] MRN: [id number] Date of Birth: [birth year] Referring Provider (PT): Delice Lesch, MD   Encounter Date: 05/31/2020   PT End of Session - 05/31/20 0931    Visit Number 11    Number of Visits 17    Date for PT Re-Evaluation 63/89/37   90 day cert for 8-wk POC   Authorization Type Medicare; PN/Recert 3/42/87    Progress Note Due on Visit 20    PT Start Time 0930    PT Stop Time 1015    PT Time Calculation (min) 45 min    Equipment Utilized During Treatment Gait belt    Activity Tolerance Patient tolerated treatment well;No increased pain    Behavior During Therapy [facility] for tasks assessed/performed;Flat affect           Past Medical History:  Diagnosis Date  . A-fib ([place])   . Anemia   . Asthma   . DM type 2 (diabetes mellitus, type 2) ([place]) 06/09/2019  . ESRD (end stage renal disease) on dialysis ([place])   . Essential hypertension 06/09/2019  . GIB (gastrointestinal bleeding)    Recurrent episodes- 09/2014, 09/2015 and 05/2016  . Gout   . History of recent blood transfusion 10/27/14   2 Units PRBC's  . Hyperkalemia 07/2011  . Multiple myeloma ([place])   . OSA on CPAP   . Pulmonary embolism ([place]) 07/2011; 09/27/2014   a. Bilat PE 07/2011 - unclear cause, tx with 6 months Coumadin.;   . Seizure disorder ([place]) 06/09/2019  . Sepsis ([place])   . Sickle cell-thalassemia disease ([place])    a. Sickle cell trait.  . Sleep apnea   . Stroke ([place]) 09/2015   R-MCA, L-MCA, PCA and bilateral cerebellar complicated by DVT/PE  . Subdural hematoma ([place]) 05/2019    Past Surgical History:  Procedure Laterality Date  . AV FISTULA PLACEMENT Right 12/05/2015   Procedure: INSERTION OF ARTERIOVENOUS (AV) GORE-TEX GRAFT ARM;  Surgeon: Elam Dutch, MD;   Location: [facility];  Service: Vascular;  Laterality: Right;  . BONE MARROW BIOPSY    . CATARACT EXTRACTION  08/2019  . CATARACT EXTRACTION  09/2019  . CHOLECYSTECTOMY    . CHOLECYSTECTOMY  1990's?  . COLONOSCOPY WITH PROPOFOL N/A 01/22/2017   Procedure: COLONOSCOPY WITH PROPOFOL;  Surgeon: Wilford Corner, MD;  Location: [facility];  Service: Endoscopy;  Laterality: N/A;  . EYE SURGERY Right   . INSERTION OF DIALYSIS CATHETER Right 10/16/2015   Procedure: INSERTION OF PALINDROME DIALYSIS CATHETER ;  Surgeon: Elam Dutch, MD;  Location: [facility];  Service: Vascular;  Laterality: Right;  . OTHER SURGICAL HISTORY     Retinal surgery  . PARS PLANA VITRECTOMY  02/17/2012   Procedure: PARS PLANA VITRECTOMY WITH 25 GAUGE;  Surgeon: Hayden Pedro, MD;  Location: [facility];  Service: Ophthalmology;  Laterality: Right;  . PERIPHERAL VASCULAR CATHETERIZATION N/A 03/20/2016   Procedure: A/V Shuntogram/Fistulagram;  Surgeon: Conrad [place], MD;  Location: [facility];  Service: Cardiovascular;  Laterality: N/A;  . TEE WITHOUT CARDIOVERSION N/A 12/02/2016   Procedure: TRANSESOPHAGEAL ECHOCARDIOGRAM (TEE);  Surgeon: Larey Dresser, MD;  Location: [facility];  Service: Cardiovascular;  Laterality: N/A;    There were no vitals filed for this visit.   Subjective Assessment - 05/31/20 0931    Subjective  Had an infusion treatment morning. Tired today. Reports straining his neck with balance ex's last session, feeling better today. No falls or pain to report.    Pertinent History multiple myeloma, ESRD on dialysis, DM type II, DVT with IVC filter, CVA, COPD, a-fib; chemo treatments down to 1x/month (next is August 17th)    Patient Stated Goals To get back to walk without the walker, back to his baseline              [facility] PT Assessment - 05/31/20 0937      Berg Balance Test   Sit to Stand Able to stand  independently using hands    Standing Unsupported Able to stand 2 minutes with supervision    posterior lean   Sitting with Back Unsupported but Feet Supported on Floor or Stool Able to sit safely and securely 2 minutes    Stand to Sit Controls descent by using hands    Transfers Able to transfer safely, definite need of hands    Standing Unsupported with Eyes Closed Able to stand 10 seconds with supervision    Standing Unsupported with Feet Together Able to place feet together independently but unable to hold for 30 seconds    From Standing, Reach Forward with Outstretched Arm Reaches forward but needs supervision    From Standing Position, Pick up Object from [place] to pick up shoe, needs supervision    From Standing Position, Turn to Look Behind Over each Shoulder Turn sideways only but maintains balance    Turn 360 Degrees Needs close supervision or verbal cueing    Standing Unsupported, Alternately Place Feet on Step/Stool Needs assistance to keep from falling or unable to try    Standing Unsupported, One Foot in Front Needs help to step but can hold 15 seconds    Standing on One Leg Tries to lift leg/unable to hold 3 seconds but remains standing independently    Total Score 30      Timed Up and Go Test   Normal TUG (seconds) 37   with RW   TUG Comments 7 sec improvement compared to last reassessment on 05/03/20             Treatment BBS performed TUG performed Standing in parallel bars with L HHA: static stepping fwd: 2 x 10 R and L Standing in parallel bars with slide board under heels to shift weight anterior on foot: 30 sec hold (min A required due to pt unable to control anterior lean); 30 sec with horizontal head turns (min to mod A required due to inability to contorl anterior and R lean) Standing on incline on ramp without HHA: 30 sec holds, mod A required as patient unable to control posterior lean                       PT Short Term Goals - 05/31/20 0956      PT SHORT TERM GOAL #1   Title Pt will perform HEP independenlty for improved  strength, balance and gait.  TARGET 04/27/2020    Baseline performs, but needs supervision for safety in standing    Time 4    Period Weeks    Status Partially Met    Target Date 06/28/20      PT SHORT TERM GOAL #2   Title Pt will improve Berg Score to at least 23/56 for decreased fall risk.    Baseline 13/56 03/29/2020; 21/56 05/03/2020; 30/56 on 05/31/20  Time 4    Period Weeks    Status Achieved      PT SHORT TERM GOAL #3   Title Pt will improve TUG score to less than or equal to 40 seconds for decreased fall risk.    Baseline 52.25 sec 03/29/2020; 41.75 at best 05/03/2020; 37 seconds (05/31/20)    Time 4    Period Weeks    Status Achieved      PT SHORT TERM GOAL #4   Title Pt will perform at least 3 minutes of standing with UE support and supervision for imrpoved standing tolerance for participation in ADLs.    Baseline 54 sec, 36 sec min guard/close supervision; 5:30 standing 05/03/2020    Time 4    Period Weeks    Status Achieved      PT SHORT TERM GOAL #5   Title Pt will improve gait velocity to at least 1.3 ft/sec for improved gait efficiency and safety.    Baseline 1.03 ft/sec 05/03/2020; 1.54f/s 05/31/20    Time 4    Period Weeks    Status On-going    Target Date 06/28/20      PT SHORT TERM GOAL #6   Title Pt/wife will verbalize understanding of fall prevention in home environment.    Time 4    Period Weeks    Status Achieved    Target Date 06/28/20             PT Long Term Goals - 05/31/20 1013      PT LONG TERM GOAL #1   Title Pt will be independent with progression of HEP for improved strength, blaance, gait.  TARGET 06/08/2020-extended due to delays in scheduling    Time 8    Period Weeks    Status On-going    Target Date 07/26/20      PT LONG TERM GOAL #2   Title Pt will imrpove Berg Balance score to at least 33/56 for decreased fall risk.    Baseline 30/56 05/31/20    Time 8    Period Weeks    Status On-going    Target Date 07/26/20      PT LONG  TERM GOAL #3   Title Pt will improve TUG score to less than or equal to 30 seconds for decreased fall risk.    Baseline 37 sec 05/31/20    Time 8    Period Weeks    Status On-going    Target Date 07/26/20      PT LONG TERM GOAL #4   Title Pt will improve gait velocity to at least 1.8 ft/sec for improved gait velocity and decreased fall risk.    Baseline 1.12 ft/s 05/31/20    Time 8    Period Weeks    Status On-going    Target Date 07/26/20      PT LONG TERM GOAL #5   Title Pt will ascend/descent 12 steps with both handrails with supervision only, to negotiate steps at home.    Baseline not attempted 05/31/20    Time 8    Period Weeks    Status On-going    Target Date 07/26/20                 Plan - 05/31/20 0959    Clinical Impression Statement Patient has been seen for total of 11 sessions in physical therapy from 03/29/20 to 05/31/20 for gait and balance instability after stroke. Patient has met STG # 2,3,4, and 6. Patient is  making progress towards LTG. Patient has progressed well and demonstrated signficant improvement in the score of Timed up and go test, Berg balance scale and gait velocity. Patient continues to have poor safety awareness with trasnfers with hand placement. Patient has excessive posterior loss of balance with standing and dynamic activities that still poses increased risk for falling. Patient scored 30/56 on berg balance scale which puts him at high risk for fall. patient will continue to benefit from skilled PT to address these impairments and improve overall function.    Personal Factors and Comorbidities Comorbidity 3+    Comorbidities PMH:  multiple myeloma, ESRD on dialysis, DM type II, DVT with IVC filter, CVA, COPD, a-fib    Examination-Activity Limitations Locomotion Level;Transfers;Stairs;Stand    Examination-Participation Restrictions Community Activity    Stability/Clinical Decision Making Evolving/Moderate complexity    Rehab Potential Good    PT  Frequency 2x / week    PT Duration 8 weeks   plus eval   PT Treatment/Interventions ADLs/Self Care Home Management;DME Instruction;Neuromuscular re-education;Balance training;Therapeutic exercise;Therapeutic activities;Functional mobility training;Stair training;Gait training;Patient/family education    PT Next Visit Plan Continue standing balance and standing strengthening; gait training with RW (obstacle negoitation);    PT Home Exercise Plan Access Code [email or web address]    Consulted and Agree with Plan of Care Patient           Patient will benefit from skilled therapeutic intervention in order to improve the following deficits and impairments:  Abnormal gait, Difficulty walking, Decreased safety awareness, Decreased balance, Decreased mobility, Decreased strength  Visit Diagnosis: Muscle weakness (generalized)  Unsteadiness on feet  Other abnormalities of gait and mobility  SDH (subdural hematoma) (H[facility])     Problem List Patient Active Problem List   Diagnosis Date Noted  . Colitis 03/15/2020  . AMS (altered mental status) 10/05/2019  . Acute metabolic encephalopathy 95/63/8756  . Headache, unspecified 09/28/2019  . Abnormality of gait 09/13/2019  . Diabetes mellitus with end-stage renal disease ([place]) 08/07/2019  . Benign hypertensive kidney disease with chronic kidney disease 08/07/2019  . Hypothyroidism, unspecified 07/15/2019  . Spastic hemiparesis ([place]) 07/12/2019  . Dysphagia, oropharyngeal phase 07/05/2019  . Allergy, unspecified, initial encounter 06/27/2019  . PAF (paroxysmal atrial fibrillation) ([place])   . Bacteremia   . Labile blood pressure   . Labile blood glucose   . Controlled type 2 diabetes mellitus with hyperglycemia, without long-term current use of insulin ([place])   . ESRD on dialysis ([place])   . Right sided weakness   . SDH (subdural hematoma) ([place])   . Chronic intracranial subdural hematoma (H[facility]) 06/09/2019  . Multiple myeloma not having achieved remission  ([place]) 06/09/2019  . ESRD on hemodialysis ([place]) 06/09/2019  . Essential hypertension 06/09/2019  . DM type 2 (diabetes mellitus, type 2) ([place]) 06/09/2019  . Seizure disorder ([place]) 06/09/2019  . Aspiration pneumonia ([place]) 06/09/2019  . History of bacteremia 06/09/2019  . Atrial fibrillation, chronic ([place]) 06/09/2019  . Bilateral kidney stones 05/31/2019  . Proteus infection 05/28/2019  . History of restrictive pulmonary disease 04/07/2019  . Multiple myeloma ([place]) 05/17/2018  . Goals of care, counseling/discussion 05/17/2018  . Partial idiopathic epilepsy with seizures of localized onset, intractable, without status epilepticus ([place]) 11/02/2017  . Iron deficiency anemia, unspecified 01/22/2017  . COPD (chronic obstructive pulmonary disease) ([place]) 11/11/2016  . CVA (cerebral vascular accident) ([place]) 11/11/2016  . GERD (gastroesophageal reflux disease) 11/11/2016  . GI bleed 11/11/2016  . 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  . Obesity (BMI 30-39.9)  11/11/2016  . Pulmonary hypertension ([place]) 11/11/2016  . Patent foramen ovale 11/11/2016  . Hyperparathyroidism, secondary renal ([place]) 11/11/2016  . VTE (venous thromboembolism) 11/11/2016  . Hypotension 11/11/2016  . Systemic hypertension 11/11/2016  . Seizure ([place]) 11/11/2016  . Other seizures ([place]) 07/25/2016  . Encounter for removal of sutures 05/27/2016  . Ingrown nail 03/03/2016  . Onychomycosis due to dermatophyte 03/03/2016  . Other acquired hammer toe 03/03/2016  . Hemiparesis affecting right side as late effect of cerebrovascular accident ([place]) 02/28/2016  . Aphasia complicating stroke 01/74/9449  . Seizures ([place]) 01/08/2016  . History of ischemic left MCA stroke 01/08/2016  . Right sided weakness 01/08/2016  . Atrial fibrillation ([place]) 01/08/2016  . Leukocytosis 01/08/2016  . Benign essential HTN 01/08/2016  . Anemia of chronic disease 01/08/2016  . Controlled type 2 diabetes mellitus with diabetic nephropathy,  without long-term current use of insulin ([place]) 01/08/2016  . History of DVT (deep vein thrombosis) 01/08/2016  . 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 Hypocalcemia 12/26/2015  . Long term (current) use of anticoagulants 11/07/2015  . Aftercare including intermittent dialysis ([place]) 11/02/2015  . Other specified coagulation defects ([place]) 11/02/2015  . Complication of vascular dialysis catheter 11/02/2015  . Diarrhea, unspecified 11/02/2015  . Fever, unspecified 11/02/2015  . Pruritus, unspecified 11/02/2015  . Shortness of breath 11/02/2015  . ESRD (end stage renal disease) on dialysis [name])     Kerrie Pleasure, PT 05/31/2020, 12:00 PM  [facility] [address] [place], [place], [address] Phone: [phone]   Fax:  [phone]  Name: Isaiah [name] MRN: [id number] Date of Birth: [birth year]

## 2020-06-01 DIAGNOSIS — Z992 Dependence on renal dialysis: Secondary | ICD-10-CM | POA: Diagnosis not present

## 2020-06-01 DIAGNOSIS — D509 Iron deficiency anemia, unspecified: Secondary | ICD-10-CM | POA: Diagnosis not present

## 2020-06-01 DIAGNOSIS — Z23 Encounter for immunization: Secondary | ICD-10-CM | POA: Diagnosis not present

## 2020-06-01 DIAGNOSIS — E1129 Type 2 diabetes mellitus with other diabetic kidney complication: Secondary | ICD-10-CM | POA: Diagnosis not present

## 2020-06-01 DIAGNOSIS — N2581 Secondary hyperparathyroidism of renal origin: Secondary | ICD-10-CM | POA: Diagnosis not present

## 2020-06-01 DIAGNOSIS — N186 End stage renal disease: Secondary | ICD-10-CM | POA: Diagnosis not present

## 2020-06-04 ENCOUNTER — Ambulatory Visit: Payer: Medicare Other

## 2020-06-04 DIAGNOSIS — D509 Iron deficiency anemia, unspecified: Secondary | ICD-10-CM | POA: Diagnosis not present

## 2020-06-04 DIAGNOSIS — Z992 Dependence on renal dialysis: Secondary | ICD-10-CM | POA: Diagnosis not present

## 2020-06-04 DIAGNOSIS — N186 End stage renal disease: Secondary | ICD-10-CM

## 2020-06-04 DIAGNOSIS — E1122 Type 2 diabetes mellitus with diabetic chronic kidney disease: Secondary | ICD-10-CM

## 2020-06-04 DIAGNOSIS — E1129 Type 2 diabetes mellitus with other diabetic kidney complication: Secondary | ICD-10-CM | POA: Diagnosis not present

## 2020-06-04 DIAGNOSIS — N2581 Secondary hyperparathyroidism of renal origin: Secondary | ICD-10-CM | POA: Diagnosis not present

## 2020-06-04 DIAGNOSIS — Z23 Encounter for immunization: Secondary | ICD-10-CM | POA: Diagnosis not present

## 2020-06-04 DIAGNOSIS — C9000 Multiple myeloma not having achieved remission: Secondary | ICD-10-CM

## 2020-06-04 NOTE — Chronic Care Management (AMB) (Signed)
Chronic Care Management    Social Work Follow Up Note  06/04/2020 Name: [name] MRN: [id number] DOB: 1966/01/10  Isaiah [name] is a 54 y.o. year old male who is a primary care patient of [name], MD. The CCM team was consulted for assistance with care coordination.   Review of patient status, including review of consultants reports, other relevant assessments, and collaboration with appropriate care team members and the patient's provider was performed as part of comprehensive patient evaluation and provision of chronic care management services.    SDOH (Social Determinants of Health) assessments performed: No    Outpatient Encounter Medications as of 06/04/2020  Medication Sig  . acetaminophen (TYLENOL) 325 MG tablet Take 1-2 tablets (325-650 mg total) by mouth every 4 (four) hours as needed for mild pain.  [name] acyclovir (ZOVIRAX) 400 MG tablet TAKE 1 TABLET BY MOUTH ONCE DAILY  . albuterol (PROAIR HFA) 108 (90 Base) MCG/ACT inhaler Inhale two puffs every 4-6 hours if needed for cough or wheeze (Patient taking differently: Inhale 2 puffs into the lungs every 4 (four) hours as needed for wheezing or shortness of breath. Inhale two puffs every 4-6 hours if needed for cough or wheeze)  . Alcohol Swabs (SM ALCOHOL PREP) 70 % PADS   . atorvastatin (LIPITOR) 20 MG tablet TAKE 1 TABLET BY MOUTH ONCE A DAY  . AURYXIA 1 GM 210 MG(Fe) tablet Take 210 mg by mouth 3 (three) times daily.  [name] BREO ELLIPTA 200-25 MCG/INH AEPB INHALE 1 PUFF INTO THE LUNGS AT THE SAME TIME EVERY DAY  . brimonidine (ALPHAGAN) 0.2 % ophthalmic solution Place 1 drop into the right eye 2 (two) times daily.   . calcium acetate (PHOSLO) 667 MG capsule Take by mouth.  . camphor-menthol (SARNA) lotion Apply 1 application topically every 8 (eight) hours as needed for itching.  . Cetirizine HCl 10 MG CAPS Take 1 capsule by mouth daily.   . Cinacalcet HCl (SENSIPAR PO) Take by mouth.  . clonazePAM (KLONOPIN) 0.5 MG tablet  Take 2 tablets every Monday, Wednesday, and Friday with hemodialysis  . COSOPT 22.3-6.8 MG/ML ophthalmic solution Place 1 drop into the right eye 2 (two) times daily.  [name] FREESTYLE LITE test strip   . hydrOXYzine (ATARAX/VISTARIL) 25 MG tablet TAKE 1 TABLET (25 MG TOTAL) BY MOUTH EVERY 8 (EIGHT) HOURS AS NEEDED FOR ITCHING.  . Insulin Glargine (BASAGLAR KWIKPEN) 100 UNIT/ML Inject 10 Units into the skin daily.   . Insulin Lispro (HUMALOG KWIKPEN [name]) Inject 2-4 Units into the skin 3 (three) times daily before meals. Sliding scale: 150-250 =2 units, 250 -350=3 units, 350 += 4 units  . lacosamide (VIMPAT) 200 MG TABS tablet Take 1 tablet twice a day except on dialysis days M-W-F give 1 tab in AM, 1 tab after dialysis, 1 tab in PM.  . lamoTRIgine (LAMICTAL) 200 MG tablet Take 1 tablet (200 mg total) by mouth 2 (two) times daily.  . Lancets (FREESTYLE) lancets   . metoprolol tartrate (LOPRESSOR) 25 MG tablet Take 25 mg by mouth daily.  . metroNIDAZOLE (FLAGYL) 250 MG tablet Take 1 tablet (250 mg total) by mouth every 8 (eight) hours.  . midodrine (PROAMATINE) 10 MG tablet TAKE 1 TABLET BY MOUTH TWICE DAILY WITH MEALS  . multivitamin (RENA-VIT) TABS tablet Take 1 tablet by mouth at bedtime.  . Nutritional Supplements (FEEDING SUPPLEMENT, NEPRO CARB STEADY,) LIQD Take 237 mLs by mouth 3 (three) times daily as needed (Supplement).  . pantoprazole (PROTONIX) 40  MG tablet TAKE 1 TABLET BY MOUTH ONCE DAILY  . prochlorperazine (COMPAZINE) 10 MG tablet TAKE 1 TABLET (10 MG TOTAL) BY MOUTH EVERY 6 (SIX) HOURS AS NEEDED FOR NAUSEA OR VOMITING.  [name] UNIFINE PENTIPS 31G X 8 MM MISC USE AS DIRECTED DAILY AFTER SUPPER   No facility-administered encounter medications on file as of 06/04/2020.     Goals Addressed            This Visit's Progress   . "to follow his renal diet and fluid restriction as recommended"   On track    Wife stated:  [name] (see longitudinal plan of care for additional care plan  information)  Current Barriers:  [name] Knowledge Deficit related to nutritional and fluid intake per ESRD diet recommendations  . Chronic Disease Management support and education needs related to DM with ESRD, Multiple Myeloma  Nurse Case Manager Clinical Goal(s):  [name] Over the next 90 days, patient will verbalize basic understanding of End Stage Renal disease process and self health management plan as evidenced by patient will maintain ideal body weight without excess fluid greater than 6 lbs between HD treatments . Over the next 90 days, patient will verbalize basic understanding of End Stage Renal disease process and self health management plan as evidenced by patient will maintain adequate nutritional intake per ESRD diet recommendations  CCM SW Interventions: Completed 06/04/20 with [facility] . Successful outbound call placed to the patients spouse to assess goal progression . Determined the patient continues to do well with Dialysis treatments with no missed appointments o No acute concerns surrounding HD at this time . Collaboration with RN Care Manager to inform patient is stable with current HD treatment plan  CCM RN CM Interventions:  04/13/20 call completed with patient's [name]  . Inter-disciplinary care team collaboration (see longitudinal plan of care) . Evaluation of current treatment plan related to ESRD w/hemodialysis and patient's adherence to plan as established by provider . Determined patient continues to attend the [name] in [place] on Tuesday, Thursdays and Saturdays; he is followed by Dr. Jimmy Footman . Determined patient has an AVF for use of hemodialysis; Re-educated on how to check access for patency   . Determined patient's dialysis treatments have been uneventful, he is adhering to his renal diet and fluid restrictions as directed, wife denies questions at this time . Discussed plans with patient for ongoing care management follow up and provided  patient with direct contact information for care management team . Confirmed wife/patient received the printed educational materials related to Eating and Nutrition for Hemodialysis, no questions noted today   Patient Self Care Activities:  . Self administers medications as prescribed . Attends all scheduled provider appointments . Calls pharmacy for medication refills . Calls provider office for new concerns or questions . Supportive wife to assist with care needs  Initial goal documentation and Please see past updates related to this goal by clicking on the "Past Updates" button in the selected goal      . Collaborate with RN Care Manager to perform apprpriate assessments to determine care management and care coordination needs   On track    Current Barriers:  . Care management and care coordination needs related to DMII, ESRD, and multiple myeloma  which resulted in inpatient hospitalization . ADL IADL limitations  Clinical Social Work Clinical Goal(s):  [name] Over the next 60 days, patient will collaborate with care management team to assist with care coordination needs in response  to recent inpatient hospitalization . New 01/16/20- Over the next 20 days the patient and his spouse will work with SW to identify cause of Medicaid denial and initiate an appeal Goal Met . New 01/25/20 Over the next 60 days the patient and his wife will work with SW to address ongoing care coordination needs related to caregiver assistance Goal not met due to challenges with COVID 19 pandemic . New 05/24/20 Over the next week the patient will complete CAP/DA telephonic assessment  CCM SW Interventions: Completed 06/04/20 with [facility] . Successful outbound call placed to patients spouse to assess goal progression . Determined the patient successfully completed telephonic RN assessment o Mrs. Littlefield reports they are awaiting determination of patient approval . Discussed plan to select a home care provider once  the patient is deemed appropriate for services . Scheduled follow up call over the next 6 weeks o Advised Mrs. Potempa to contact SW sooner if needed prior to next scheduled call   Completed 05/24/20 with [facility] . Inbound call received from [facility] who reports the patient has a scheduled virtual assessment with the CAP/DA Nurse and Social worker on September 23rd . Discussed the patient has yet to be informed whether or not he is approved for Medicaid . Assessed for acute care coordination needs-  no challenges noted at this time . Encouraged Mrs. Becht to contact SW directly with outcome of telephonic assessments . Scheduled follow up call over the next month  Completed 05/02/20 with [facility] . Successful outbound call placed to Mrs. Pantoja to assess goal progression . Determined the patient has yet to have a CAP nursing assessment or receive a determination from [place] o Mrs. Wach reports she is frustrated with this process and may not proceed if the application needs to be re-submitted again as it did in the past . Discussed barriers to caregiver resources due to ongoing pandemic . Encouraged Mrs. Rocha to contact SW as needed with ongoing care coordination needs . Goal closed  Completed 04/05/20 with [facility] . Successful outbound call placed to the patients spouse to assess goal progression  . Informed by Mrs. Gillison the patient was just seen by Dr. Coralyn Pear to address a bleed behind the patients right eye o Patient received an injections and prescription for eye drops o Instructed to return in 1 week for a re-check o Collaboration with RN Care Manager regarding recent update to patients health history . Determined Mrs. Magouirk was contacted by Mrs. Larence Penning with the in home aide office requesting a new Medicaid application be completed o Mrs. Kirschenbaum completed this application and submitted to Mrs. Larence Penning approximately 1 week ago . Discussed concern with staffing challenges  which is delaying patients CAP assessment . Determined m[facility]. [name] has yet to be told when she will need to return to the office setting for full-time hours but she is concerned she may not be able to continue remote work indefinitely to assist with patient care needs . Scheduled follow up call over the next month to assess goal progression o Encouraged Mrs. Mijangos to contact SW and or Consulting civil engineer as needed prior to next scheduled call  Completed 03/20/20 . Successful outbound call placed to [facility] in response to voice message received . Mrs. Lalone reports she is still awaiting assessment for CAP services . Mrs. Tudisco was contacted by Mrs. Larence Penning with the CAP office who stated they are short staffed but are hoping to be able to complete assessment  in the coming weeks  Completed 02/29/20 with [facility] . Successful outbound call placed to the patients spouse to review goal progression . Determined Mrs. [facility] has been in contact with Ilda Mori (contact made on 6/22)  who has assigned a nurse to perform an assessment to determine patient eligibility for CAP/DA services o Mrs. Larence Penning informed Mrs. Berch to expect a call over the next 10 days to schedule assessment . Encouraged Mrs. Glowacki to contact SW with outcome of assessment and/or if further SW assistance is needed  . Scheduled follow up call over the next 6 weeks   Patient Self Care Activities:  . Attends all scheduled provider appointments . Calls pharmacy for medication refills . Calls provider office for new concerns or questions . Supportive wife and caregiver to assist with patient care needs   Please see past updates related to this goal by clicking on the "Past Updates" button in the selected goal         Follow Up Plan: SW will follow up with patient by phone over the next 6 weeks.   [name], BSW, CDP Social Worker, Certified Dementia Practitioner [place] [facility] [place] [facility] [phone]  Total time  spent performing care coordination and/or care management activities with the patient by phone or face to face = 8 minutes.

## 2020-06-04 NOTE — Patient Instructions (Signed)
Social Worker Visit Information  Goals we discussed today:  Goals Addressed            This Visit's Progress   . "to follow his renal diet and fluid restriction as recommended"   On track    Wife stated:  [place] (see longitudinal plan of care for additional care plan information)  Current Barriers:  [name] Knowledge Deficit related to nutritional and fluid intake per ESRD diet recommendations  . Chronic Disease Management support and education needs related to DM with ESRD, Multiple Myeloma  Nurse Case Manager Clinical Goal(s):  [name] Over the next 90 days, patient will verbalize basic understanding of End Stage Renal disease process and self health management plan as evidenced by patient will maintain ideal body weight without excess fluid greater than 6 lbs between HD treatments . Over the next 90 days, patient will verbalize basic understanding of End Stage Renal disease process and self health management plan as evidenced by patient will maintain adequate nutritional intake per ESRD diet recommendations  CCM SW Interventions: Completed 06/04/20 with [facility] . Successful outbound call placed to the patients spouse to assess goal progression . Determined the patient continues to do well with Dialysis treatments with no missed appointments o No acute concerns surrounding HD at this time . Collaboration with RN Care Manager to inform patient is stable with current HD treatment plan  CCM RN CM Interventions:  04/13/20 call completed with patient's [name]  . Inter-disciplinary care team collaboration (see longitudinal plan of care) . Evaluation of current treatment plan related to ESRD w/hemodialysis and patient's adherence to plan as established by provider . Determined patient continues to attend the [facility] in [place] on Tuesday, Thursdays and Saturdays; he is followed by Dr. Jimmy Footman . Determined patient has an AVF for use of hemodialysis; Re-educated  on how to check access for patency   . Determined patient's dialysis treatments have been uneventful, he is adhering to his renal diet and fluid restrictions as directed, wife denies questions at this time . Discussed plans with patient for ongoing care management follow up and provided patient with direct contact information for care management team . Confirmed wife/patient received the printed educational materials related to Eating and Nutrition for Hemodialysis, no questions noted today   Patient Self Care Activities:  . Self administers medications as prescribed . Attends all scheduled provider appointments . Calls pharmacy for medication refills . Calls provider office for new concerns or questions . Supportive wife to assist with care needs  Initial goal documentation and Please see past updates related to this goal by clicking on the "Past Updates" button in the selected goal      . Collaborate with RN Care Manager to perform apprpriate assessments to determine care management and care coordination needs   On track    Current Barriers:  . Care management and care coordination needs related to DMII, ESRD, and multiple myeloma  which resulted in inpatient hospitalization . ADL IADL limitations  Clinical Social Work Clinical Goal(s):  [name] Over the next 60 days, patient will collaborate with care management team to assist with care coordination needs in response to recent inpatient hospitalization . New 01/16/20- Over the next 20 days the patient and his spouse will work with SW to identify cause of Medicaid denial and initiate an appeal Goal Met . New 01/25/20 Over the next 60 days the patient and his wife will work with SW to address ongoing care coordination  needs related to caregiver assistance Goal not met due to challenges with COVID 19 pandemic . New 05/24/20 Over the next week the patient will complete CAP/DA telephonic assessment  CCM SW Interventions: Completed 06/04/20 with [facility] . Successful outbound call placed to patients spouse to assess goal progression . Determined the patient successfully completed telephonic RN assessment o Mrs. Soper reports they are awaiting determination of patient approval . Discussed plan to select a home care provider once the patient is deemed appropriate for services . Scheduled follow up call over the next 6 weeks o Advised Mrs. Castorena to contact SW sooner if needed prior to next scheduled call   Completed 05/24/20 with [facility] . Inbound call received from [facility] who reports the patient has a scheduled virtual assessment with the CAP/DA Nurse and Social worker on September 23rd . Discussed the patient has yet to be informed whether or not he is approved for Medicaid . Assessed for acute care coordination needs-  no challenges noted at this time . Encouraged Mrs. Calais to contact SW directly with outcome of telephonic assessments . Scheduled follow up call over the next month  Completed 05/02/20 with [name] . Successful outbound call placed to Mrs. Woodroof to assess goal progression . Determined the patient has yet to have a CAP nursing assessment or receive a determination from [place] o Mrs. Feimster reports she is frustrated with this process and may not proceed if the application needs to be re-submitted again as it did in the past . Discussed barriers to caregiver resources due to ongoing pandemic . Encouraged Mrs. Buzzelli to contact SW as needed with ongoing care coordination needs . Goal closed  Completed 04/05/20 with [facility] . Successful outbound call placed to the patients spouse to assess goal progression  . Informed by Mrs. Kinlaw the patient was just seen by Dr. Coralyn Pear to address a bleed behind the patients right eye o Patient received an injections and prescription for eye drops o Instructed to return in 1 week for a re-check o Collaboration with RN Care Manager regarding recent update to patients  health history . Determined Mrs. Nowaczyk was contacted by Mrs. Larence Penning with the in home aide office requesting a new Medicaid application be completed o Mrs. Wooden completed this application and submitted to Mrs. Larence Penning approximately 1 week ago . Discussed concern with staffing challenges which is delaying patients CAP assessment . Determined m[facility]. [facility] has yet to be told when she will need to return to the office setting for full-time hours but she is concerned she may not be able to continue remote work indefinitely to assist with patient care needs . Scheduled follow up call over the next month to assess goal progression o Encouraged Mrs. Deloatch to contact SW and or Consulting civil engineer as needed prior to next scheduled call  Completed 03/20/20 . Successful outbound call placed to [facility] in response to voice message received . Mrs. Leaf reports she is still awaiting assessment for CAP services . Mrs. Paredez was contacted by Mrs. Larence Penning with the CAP office who stated they are short staffed but are hoping to be able to complete assessment in the coming weeks  Completed 02/29/20 with [name] . Successful outbound call placed to the patients spouse to review goal progression . Determined Mrs. [facility] has been in contact with Ilda Mori (contact made on 6/22)  who has assigned a nurse to perform an assessment to determine patient eligibility for CAP/DA services  o Mrs. Larence Penning informed Mrs. Calleros to expect a call over the next 10 days to schedule assessment . Encouraged Mrs. Wing to contact SW with outcome of assessment and/or if further SW assistance is needed  . Scheduled follow up call over the next 6 weeks   Patient Self Care Activities:  . Attends all scheduled provider appointments . Calls pharmacy for medication refills . Calls provider office for new concerns or questions . Supportive wife and caregiver to assist with patient care needs   Please see past updates related to this  goal by clicking on the "Past Updates" button in the selected goal         Follow Up Plan: SW will follow up with patient by phone over the next 6 weeks   [name], BSW, CDP Social Worker, Certified Dementia Practitioner [place] [facility] [place] [facility] [phone]

## 2020-06-05 ENCOUNTER — Other Ambulatory Visit: Payer: Self-pay

## 2020-06-05 ENCOUNTER — Ambulatory Visit: Payer: Medicare Other | Admitting: Physical Therapy

## 2020-06-05 DIAGNOSIS — M6281 Muscle weakness (generalized): Secondary | ICD-10-CM

## 2020-06-05 DIAGNOSIS — R2681 Unsteadiness on feet: Secondary | ICD-10-CM

## 2020-06-05 DIAGNOSIS — S065X9A Traumatic subdural hemorrhage with loss of consciousness of unspecified duration, initial encounter: Secondary | ICD-10-CM | POA: Diagnosis not present

## 2020-06-05 DIAGNOSIS — R2689 Other abnormalities of gait and mobility: Secondary | ICD-10-CM

## 2020-06-05 NOTE — Therapy (Signed)
[facility] [address] [place], [place], [address] Phone: [phone]   Fax:  [phone]  Physical Therapy Treatment  Patient Details  Name: Isaiah [name] [facility] MRN: [id number] Date of Birth: 1966-04-02 Referring Provider (PT): Delice Lesch, MD   Encounter Date: 06/05/2020   PT End of Session - 06/05/20 2141    Visit Number 12    Number of Visits 17    Date for PT Re-Evaluation 53/29/92   90 day cert for 8-wk POC   Authorization Type Medicare; PN/Recert 01/01/82    Progress Note Due on Visit 20    PT Start Time 0939   Pt arrives late   PT Stop Time 1015    PT Time Calculation (min) 36 min    Equipment Utilized During Treatment Gait belt    Activity Tolerance Patient tolerated treatment well;No increased pain    Behavior During Therapy [name] for tasks assessed/performed;Flat affect           Past Medical History:  Diagnosis Date  . A-fib ([place])   . Anemia   . Asthma   . DM type 2 (diabetes mellitus, type 2) ([place]) 06/09/2019  . ESRD (end stage renal disease) on dialysis ([place])   . Essential hypertension 06/09/2019  . GIB (gastrointestinal bleeding)    Recurrent episodes- 09/2014, 09/2015 and 05/2016  . Gout   . History of recent blood transfusion 10/27/14   2 Units PRBC's  . Hyperkalemia 07/2011  . Multiple myeloma ([place])   . OSA on CPAP   . Pulmonary embolism ([place]) 07/2011; 09/27/2014   a. Bilat PE 07/2011 - unclear cause, tx with 6 months Coumadin.;   . Seizure disorder ([place]) 06/09/2019  . Sepsis ([place])   . Sickle cell-thalassemia disease ([place])    a. Sickle cell trait.  . Sleep apnea   . Stroke ([place]) 09/2015   R-MCA, L-MCA, PCA and bilateral cerebellar complicated by DVT/PE  . Subdural hematoma ([place]) 05/2019    Past Surgical History:  Procedure Laterality Date  . AV FISTULA PLACEMENT Right 12/05/2015   Procedure: INSERTION OF ARTERIOVENOUS (AV) GORE-TEX GRAFT ARM;  Surgeon: Elam Dutch, MD;  Location: [facility];  Service: Vascular;  Laterality: Right;  . BONE MARROW BIOPSY    . CATARACT EXTRACTION  08/2019  . CATARACT EXTRACTION  09/2019  . CHOLECYSTECTOMY    . CHOLECYSTECTOMY  1990's?  . COLONOSCOPY WITH PROPOFOL N/A 01/22/2017   Procedure: COLONOSCOPY WITH PROPOFOL;  Surgeon: Wilford Corner, MD;  Location: [facility];  Service: Endoscopy;  Laterality: N/A;  . EYE SURGERY Right   . INSERTION OF DIALYSIS CATHETER Right 10/16/2015   Procedure: INSERTION OF PALINDROME DIALYSIS CATHETER ;  Surgeon: Elam Dutch, MD;  Location: [facility];  Service: Vascular;  Laterality: Right;  . OTHER SURGICAL HISTORY     Retinal surgery  . PARS PLANA VITRECTOMY  02/17/2012   Procedure: PARS PLANA VITRECTOMY WITH 25 GAUGE;  Surgeon: Hayden Pedro, MD;  Location: [facility];  Service: Ophthalmology;  Laterality: Right;  . PERIPHERAL VASCULAR CATHETERIZATION N/A 03/20/2016   Procedure: A/V Shuntogram/Fistulagram;  Surgeon: Conrad Redfield, MD;  Location: [facility];  Service: Cardiovascular;  Laterality: N/A;  . TEE WITHOUT CARDIOVERSION N/A 12/02/2016   Procedure: TRANSESOPHAGEAL ECHOCARDIOGRAM (TEE);  Surgeon: Larey Dresser, MD;  Location: [facility];  Service: Cardiovascular;  Laterality: N/A;    There were no vitals filed for this visit.   Subjective Assessment - 06/05/20 0941  Subjective Met with the neurologist and everything is good.  No falls.    Pertinent History multiple myeloma, ESRD on dialysis, DM type II, DVT with IVC filter, CVA, COPD, a-fib; chemo treatments down to 1x/month (next is August 17th)    Patient Stated Goals To get back to walk without the walker, back to his baseline    Currently in Pain? No/denies                             [facility] Adult PT Treatment/Exercise - 06/05/20 0001      Ambulation/Gait   Ambulation/Gait Yes    Ambulation/Gait Assistance 4: Min guard    Ambulation/Gait Assistance Details Cues needed to stay close to BOS of walker; to avoid  obstacles; he generally is able to self-correct with obstacles towards L side, with extra time    Ambulation Distance (Feet) 80 Feet   x 2; 175 ft   Assistive device Rolling walker    Gait Pattern Step-through pattern;Ataxic;Trunk flexed;Poor foot clearance - left;Poor foot clearance - right    Ambulation Surface Level;Indoor      Knee/Hip Exercises: Standing   Hip Abduction Stengthening;Right;Left;2 sets;10 reps;Knee straight   red theraband   Hip Extension Stengthening;Right;Left;2 sets;10 reps;Knee straight   red theraband, assist to keep knee straight   Other Standing Knee Exercises Minisquats to up on toes, x 10 reps               Balance Exercises - 06/05/20 0001      Balance Exercises: Standing   SLS with Vectors Solid surface;Upper extremity assist 2;Other reps (comment);Limitations    SLS with Vectors Limitations In parallel bars, alternating step taps to 6" step; BUE support 1st set of 10, LUE support only for 2nd set of 10    Standing, One Foot on a Step Eyes open;5 reps;6 inch;Head turns;Limitations    Standing, One Foot on a Step Limitations BUE support>1 UE support, head turns x 5, head nods x 5; 2 sets    Stepping Strategy Anterior;Posterior;UE support;10 reps;Limitations    Stepping Strategy Limitations with UE support in parallel bars with black foam beam on floor- alternating fwd/bwd stepping over beam for 10 reps each side, 2 sets    Marching Solid surface;Upper extremity assist 2;Static;10 reps    Heel Raises Both;10 reps   2 sets   Toe Raise Both;10 reps   2 sets              PT Short Term Goals - 06/05/20 2149      PT SHORT TERM GOAL #1   Title Pt will perform HEP independenlty for improved strength, balance and gait.    Baseline performs, but needs supervision for safety in standing    Time 4    Period Weeks    Status Partially Met    Target Date 06/28/20      PT SHORT TERM GOAL #2   Title Pt will improve gait velocity to at least 1.3 ft/sec for  improved gait efficiency and safety.    Baseline 1.29f/s with RW 05/31/20    Time 4    Period Weeks    Status On-going    Target Date 06/28/20             PT Long Term Goals - 06/05/20 2150      PT LONG TERM GOAL #1   Title Pt will be independent with progression of HEP for improved  strength, blaance, gait.    Time 8    Period Weeks    Status On-going    Target Date 07/26/20      PT LONG TERM GOAL #2   Title Pt will imrpove Berg Balance score to at least 33/56 for decreased fall risk.    Baseline 30/56 05/31/20    Time 8    Period Weeks    Status On-going    Target Date 07/26/20      PT LONG TERM GOAL #3   Title Pt will improve TUG score to less than or equal to 30 seconds for decreased fall risk.    Baseline 37 sec 05/31/20    Time 8    Period Weeks    Status On-going    Target Date 07/26/20      PT LONG TERM GOAL #4   Title Pt will improve gait velocity to at least 1.8 ft/sec for improved gait velocity and decreased fall risk.    Baseline 1.12 ft/s 05/31/20    Time 8    Period Weeks    Status On-going    Target Date 07/26/20      PT LONG TERM GOAL #5   Title Pt will ascend/descent 12 steps with both handrails with supervision only, to negotiate steps at home.    Baseline not attempted 05/31/20    Time 8    Period Weeks    Status On-going    Target Date 07/26/20                 Plan - 06/05/20 2142    Clinical Impression Statement Focused skilled PT session today on gait and balance exercises in parallel bars.  Pt able to transition to single UE support for most balance activities in parallel bars; with SLS on RLE (LLE propped on 6" step), pt able to activate through RLE to self-correct for more upright posture.  He will continue to benefit from skilled PT to further address balance and gait for improved overall mobility.    Personal Factors and Comorbidities Comorbidity 3+    Comorbidities PMH:  multiple myeloma, ESRD on dialysis, DM type II, DVT with  IVC filter, CVA, COPD, a-fib    Examination-Activity Limitations Locomotion Level;Transfers;Stairs;Stand    Examination-Participation Restrictions Community Activity    Stability/Clinical Decision Making Evolving/Moderate complexity    Rehab Potential Good    PT Frequency 2x / week    PT Duration 8 weeks   per recert 2/87/68   PT Treatment/Interventions ADLs/Self Care Home Management;DME Instruction;Neuromuscular re-education;Balance training;Therapeutic exercise;Therapeutic activities;Functional mobility training;Stair training;Gait training;Patient/family education    PT Next Visit Plan Continue standing balance and standing strengthening; gait training with RW (obstacle negoitation);    PT Home Exercise Plan Access Code [email or web address]    Consulted and Agree with Plan of Care Patient           Patient will benefit from skilled therapeutic intervention in order to improve the following deficits and impairments:  Abnormal gait, Difficulty walking, Decreased safety awareness, Decreased balance, Decreased mobility, Decreased strength  Visit Diagnosis: Unsteadiness on feet  Other abnormalities of gait and mobility  Muscle weakness (generalized)     Problem List Patient Active Problem List   Diagnosis Date Noted  . Colitis 03/15/2020  . AMS (altered mental status) 10/05/2019  . Acute metabolic encephalopathy 35/59/7416  . Headache, unspecified 09/28/2019  . Abnormality of gait 09/13/2019  . Diabetes mellitus with end-stage renal disease ([place]) 08/07/2019  . Benign hypertensive kidney disease  with chronic kidney disease 08/07/2019  . Hypothyroidism, unspecified 07/15/2019  . Spastic hemiparesis ([place]) 07/12/2019  . Dysphagia, oropharyngeal phase 07/05/2019  . Allergy, unspecified, initial encounter 06/27/2019  . PAF (paroxysmal atrial fibrillation) ([place])   . Bacteremia   . Labile blood pressure   . Labile blood glucose   . Controlled type 2 diabetes mellitus with hyperglycemia,  without long-term current use of insulin ([place])   . ESRD on dialysis ([place])   . Right sided weakness   . SDH (subdural hematoma) ([place])   . Chronic intracranial subdural hematoma ([facility]) 06/09/2019  . Multiple myeloma not having achieved remission ([place]) 06/09/2019  . ESRD on hemodialysis ([place]) 06/09/2019  . Essential hypertension 06/09/2019  . DM type 2 (diabetes mellitus, type 2) ([place]) 06/09/2019  . Seizure disorder ([place]) 06/09/2019  . Aspiration pneumonia ([place]) 06/09/2019  . History of bacteremia 06/09/2019  . Atrial fibrillation, chronic ([place]) 06/09/2019  . Bilateral kidney stones 05/31/2019  . Proteus infection 05/28/2019  . History of restrictive pulmonary disease 04/07/2019  . Multiple myeloma ([place]) 05/17/2018  . Goals of care, counseling/discussion 05/17/2018  . Partial idiopathic epilepsy with seizures of localized onset, intractable, without status epilepticus ([place]) 11/02/2017  . Iron deficiency anemia, unspecified 01/22/2017  . COPD (chronic obstructive pulmonary disease) ([place]) 11/11/2016  . CVA (cerebral vascular accident) ([place]) 11/11/2016  . GERD (gastroesophageal reflux disease) 11/11/2016  . GI bleed 11/11/2016  . 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  . Obesity (BMI 30-39.9) 11/11/2016  . Pulmonary hypertension ([place]) 11/11/2016  . Patent foramen ovale 11/11/2016  . Hyperparathyroidism, secondary renal ([place]) 11/11/2016  . VTE (venous thromboembolism) 11/11/2016  . Hypotension 11/11/2016  . Systemic hypertension 11/11/2016  . Seizure ([place]) 11/11/2016  . Other seizures ([place]) 07/25/2016  . Encounter for removal of sutures 05/27/2016  . Ingrown nail 03/03/2016  . Onychomycosis due to dermatophyte 03/03/2016  . Other acquired hammer toe 03/03/2016  . Hemiparesis affecting right side as late effect of cerebrovascular accident ([place]) 02/28/2016  . Aphasia complicating stroke 70/14/1030  . Seizures ([place]) 01/08/2016  . History of ischemic left MCA stroke 01/08/2016    . Right sided weakness 01/08/2016  . Atrial fibrillation ([place]) 01/08/2016  . Leukocytosis 01/08/2016  . Benign essential HTN 01/08/2016  . Anemia of chronic disease 01/08/2016  . Controlled type 2 diabetes mellitus with diabetic nephropathy, without long-term current use of insulin ([place]) 01/08/2016  . History of DVT (deep vein thrombosis) 01/08/2016  . 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 Hypocalcemia 12/26/2015  . Long term (current) use of anticoagulants 11/07/2015  . Aftercare including intermittent dialysis ([place]) 11/02/2015  . Other specified coagulation defects ([place]) 11/02/2015  . Complication of vascular dialysis catheter 11/02/2015  . Diarrhea, unspecified 11/02/2015  . Fever, unspecified 11/02/2015  . Pruritus, unspecified 11/02/2015  . Shortness of breath 11/02/2015  . ESRD (end stage renal disease) on dialysis [facility])     Einer Meals W. 06/05/2020, 9:53 PM Frazier Butt., PT  [facility] [address] [place], [place], [address] Phone: [phone]   Fax:  [phone]  Name: Isaiah [name] [facility] MRN: [id number] Date of Birth: [birth year]

## 2020-06-06 DIAGNOSIS — D509 Iron deficiency anemia, unspecified: Secondary | ICD-10-CM | POA: Diagnosis not present

## 2020-06-06 DIAGNOSIS — Z992 Dependence on renal dialysis: Secondary | ICD-10-CM | POA: Diagnosis not present

## 2020-06-06 DIAGNOSIS — N2581 Secondary hyperparathyroidism of renal origin: Secondary | ICD-10-CM | POA: Diagnosis not present

## 2020-06-06 DIAGNOSIS — Z23 Encounter for immunization: Secondary | ICD-10-CM | POA: Diagnosis not present

## 2020-06-06 DIAGNOSIS — N186 End stage renal disease: Secondary | ICD-10-CM | POA: Diagnosis not present

## 2020-06-06 DIAGNOSIS — E1129 Type 2 diabetes mellitus with other diabetic kidney complication: Secondary | ICD-10-CM | POA: Diagnosis not present

## 2020-06-07 ENCOUNTER — Telehealth: Payer: Self-pay | Admitting: Oncology

## 2020-06-07 ENCOUNTER — Ambulatory Visit: Payer: Medicare Other | Admitting: Physical Therapy

## 2020-06-07 ENCOUNTER — Other Ambulatory Visit: Payer: Self-pay

## 2020-06-07 DIAGNOSIS — R2681 Unsteadiness on feet: Secondary | ICD-10-CM

## 2020-06-07 DIAGNOSIS — H209 Unspecified iridocyclitis: Secondary | ICD-10-CM | POA: Diagnosis not present

## 2020-06-07 DIAGNOSIS — R2689 Other abnormalities of gait and mobility: Secondary | ICD-10-CM

## 2020-06-07 DIAGNOSIS — Z794 Long term (current) use of insulin: Secondary | ICD-10-CM | POA: Diagnosis not present

## 2020-06-07 DIAGNOSIS — H211X1 Other vascular disorders of iris and ciliary body, right eye: Secondary | ICD-10-CM | POA: Diagnosis not present

## 2020-06-07 DIAGNOSIS — E113593 Type 2 diabetes mellitus with proliferative diabetic retinopathy without macular edema, bilateral: Secondary | ICD-10-CM | POA: Diagnosis not present

## 2020-06-07 DIAGNOSIS — M6281 Muscle weakness (generalized): Secondary | ICD-10-CM

## 2020-06-07 DIAGNOSIS — Z9889 Other specified postprocedural states: Secondary | ICD-10-CM | POA: Diagnosis not present

## 2020-06-07 DIAGNOSIS — H31093 Other chorioretinal scars, bilateral: Secondary | ICD-10-CM | POA: Diagnosis not present

## 2020-06-07 DIAGNOSIS — S065X9A Traumatic subdural hemorrhage with loss of consciousness of unspecified duration, initial encounter: Secondary | ICD-10-CM | POA: Diagnosis not present

## 2020-06-07 NOTE — Therapy (Signed)
[employer] [address] [place], [place], [address] Phone: [phone]   Fax:  [phone]  Physical Therapy Treatment  Patient Details  Name: Isaiah [name] [facility] MRN: [id number] Date of Birth: [birth year] Referring Provider (PT): Delice Lesch, MD   Encounter Date: 06/07/2020   PT End of Session - 06/07/20 1149    Visit Number 13    Number of Visits 27   per recent recert wk of 4/16   Date for PT Re-Evaluation 08/30/20    Authorization Type Medicare; PN/Recert 02/11/29    Progress Note Due on Visit 20    PT Start Time 1102    PT Stop Time 1145    PT Time Calculation (min) 43 min    Equipment Utilized During Treatment Gait belt    Activity Tolerance Patient tolerated treatment well;No increased pain    Behavior During Therapy [facility] for tasks assessed/performed;Flat affect           Past Medical History:  Diagnosis Date  . A-fib ([place])   . Anemia   . Asthma   . DM type 2 (diabetes mellitus, type 2) ([place]) 06/09/2019  . ESRD (end stage renal disease) on dialysis ([place])   . Essential hypertension 06/09/2019  . GIB (gastrointestinal bleeding)    Recurrent episodes- 09/2014, 09/2015 and 05/2016  . Gout   . History of recent blood transfusion 10/27/14   2 Units PRBC's  . Hyperkalemia 07/2011  . Multiple myeloma ([place])   . OSA on CPAP   . Pulmonary embolism ([place]) 07/2011; 09/27/2014   a. Bilat PE 07/2011 - unclear cause, tx with 6 months Coumadin.;   . Seizure disorder ([place]) 06/09/2019  . Sepsis ([place])   . Sickle cell-thalassemia disease ([place])    a. Sickle cell trait.  . Sleep apnea   . Stroke ([place]) 09/2015   R-MCA, L-MCA, PCA and bilateral cerebellar complicated by DVT/PE  . Subdural hematoma ([place]) 05/2019    Past Surgical History:  Procedure Laterality Date  . AV FISTULA PLACEMENT Right 12/05/2015   Procedure: INSERTION OF ARTERIOVENOUS (AV) GORE-TEX GRAFT ARM;  Surgeon: Elam Dutch, MD;  Location: [place];  Service:  Vascular;  Laterality: Right;  . BONE MARROW BIOPSY    . CATARACT EXTRACTION  08/2019  . CATARACT EXTRACTION  09/2019  . CHOLECYSTECTOMY    . CHOLECYSTECTOMY  1990's?  . COLONOSCOPY WITH PROPOFOL N/A 01/22/2017   Procedure: COLONOSCOPY WITH PROPOFOL;  Surgeon: Wilford Corner, MD;  Location: [facility];  Service: Endoscopy;  Laterality: N/A;  . EYE SURGERY Right   . INSERTION OF DIALYSIS CATHETER Right 10/16/2015   Procedure: INSERTION OF PALINDROME DIALYSIS CATHETER ;  Surgeon: Elam Dutch, MD;  Location: [place];  Service: Vascular;  Laterality: Right;  . OTHER SURGICAL HISTORY     Retinal surgery  . PARS PLANA VITRECTOMY  02/17/2012   Procedure: PARS PLANA VITRECTOMY WITH 25 GAUGE;  Surgeon: Hayden Pedro, MD;  Location: [place];  Service: Ophthalmology;  Laterality: Right;  . PERIPHERAL VASCULAR CATHETERIZATION N/A 03/20/2016   Procedure: A/V Shuntogram/Fistulagram;  Surgeon: Conrad Robertson, MD;  Location: [place] [facility];  Service: Cardiovascular;  Laterality: N/A;  . TEE WITHOUT CARDIOVERSION N/A 12/02/2016   Procedure: TRANSESOPHAGEAL ECHOCARDIOGRAM (TEE);  Surgeon: Larey Dresser, MD;  Location: [place];  Service: Cardiovascular;  Laterality: N/A;    There were no vitals filed for this visit.   Subjective Assessment - 06/07/20 1103    Subjective Just came  from the doctor; had my eyes dilated; no other changes.    Pertinent History multiple myeloma, ESRD on dialysis, DM type II, DVT with IVC filter, CVA, COPD, a-fib; chemo treatments down to 1x/month (next is August 17th)    Patient Stated Goals To get back to walk without the walker, back to his baseline    Currently in Pain? No/denies                             [facility] Adult PT Treatment/Exercise - 06/07/20 0001      Ambulation/Gait   Ambulation/Gait Yes    Ambulation/Gait Assistance 4: Min guard    Ambulation Distance (Feet) 80 Feet   x 2   Assistive device Rolling walker    Gait Pattern  Step-through pattern;Ataxic;Trunk flexed;Poor foot clearance - left;Poor foot clearance - right    Ambulation Surface Level;Indoor      Knee/Hip Exercises: Aerobic   Stepper Seated SciFit Stepper, Level 1.5, 4 extremities x 6 minutes.  Pt able to keep RPM >60 with initial cues.  Performed for improved lower extremity stength.      Knee/Hip Exercises: Standing   Hip Abduction Stengthening;Right;Left;2 sets;10 reps;Knee straight    Abduction Limitations 2# weight on RLE    Hip Extension Stengthening;Right;Left;2 sets;10 reps;Knee straight    Extension Limitations 2# weight on RLE, needs cues for keeping knee straight throughout               Balance Exercises - 06/07/20 0001      Balance Exercises: Standing   SLS with Vectors Solid surface;Upper extremity assist 2;Other reps (comment);Limitations    SLS with Vectors Limitations In parallel bars, one foot propped on large green noodle, rolling it forward/back, 5 reps, 2 sets    Standing, One Foot on a Step Eyes open;5 reps;6 inch;Head turns;Limitations    Standing, One Foot on a Step Limitations BUE support>1 UE support, head turns x 5, foot propped on large green pool noodle    Stepping Strategy Anterior;Posterior;UE support;10 reps;Limitations;Lateral    Stepping Strategy Limitations with UE support in parallel bars with black foam beam on floor- alternating fwd/bwd stepping over beam for 10 reps each side, 2 sets.  With initial R step over, pt tends to hit obstacle, but able to improve with repetition.    Marching Solid surface;Upper extremity assist 2;Static;10 reps   2nd set RLE 2# weight   Heel Raises Both;10 reps   2 sets   Toe Raise Both;10 reps   2 sets              PT Short Term Goals - 06/05/20 2149      PT SHORT TERM GOAL #1   Title Pt will perform HEP independenlty for improved strength, balance and gait.    Baseline performs, but needs supervision for safety in standing    Time 4    Period Weeks    Status  Partially Met    Target Date 06/28/20      PT SHORT TERM GOAL #2   Title Pt will improve gait velocity to at least 1.3 ft/sec for improved gait efficiency and safety.    Baseline 1.67f/s with RW 05/31/20    Time 4    Period Weeks    Status On-going    Target Date 06/28/20             PT Long Term Goals - 06/05/20 2150  PT LONG TERM GOAL #1   Title Pt will be independent with progression of HEP for improved strength, blaance, gait.    Time 8    Period Weeks    Status On-going    Target Date 07/26/20      PT LONG TERM GOAL #2   Title Pt will imrpove Berg Balance score to at least 33/56 for decreased fall risk.    Baseline 30/56 05/31/20    Time 8    Period Weeks    Status On-going    Target Date 07/26/20      PT LONG TERM GOAL #3   Title Pt will improve TUG score to less than or equal to 30 seconds for decreased fall risk.    Baseline 37 sec 05/31/20    Time 8    Period Weeks    Status On-going    Target Date 07/26/20      PT LONG TERM GOAL #4   Title Pt will improve gait velocity to at least 1.8 ft/sec for improved gait velocity and decreased fall risk.    Baseline 1.12 ft/s 05/31/20    Time 8    Period Weeks    Status On-going    Target Date 07/26/20      PT LONG TERM GOAL #5   Title Pt will ascend/descent 12 steps with both handrails with supervision only, to negotiate steps at home.    Baseline not attempted 05/31/20    Time 8    Period Weeks    Status On-going    Target Date 07/26/20                 Plan - 06/07/20 1151    Clinical Impression Statement Functional strenghtening and balance exercises performed today, with also aerobic machine, with increase of time to 6 minutes since last attempt on SciFit.  Pt tends to use BUE support for most standing activities today for more stability.  With gait, PT needs to assist at times to avoid obstacles on L, with increased veering noted towards L today.    Personal Factors and Comorbidities Comorbidity  3+    Comorbidities PMH:  multiple myeloma, ESRD on dialysis, DM type II, DVT with IVC filter, CVA, COPD, a-fib    Examination-Activity Limitations Locomotion Level;Transfers;Stairs;Stand    Examination-Participation Restrictions Community Activity    Stability/Clinical Decision Making Evolving/Moderate complexity    Rehab Potential Good    PT Frequency 2x / week    PT Duration 8 weeks   per recert 03/10/49   PT Treatment/Interventions ADLs/Self Care Home Management;DME Instruction;Neuromuscular re-education;Balance training;Therapeutic exercise;Therapeutic activities;Functional mobility training;Stair training;Gait training;Patient/family education    PT Next Visit Plan Try to ask about scheduling additional appointments (he should be 2x/wk for at least 6 more weeks-there are several weeks he doesn't have PT); gait trianing with obstacle negotiation; functional strength and balance exercises    PT Home Exercise Plan Access Code [email or web address]    Consulted and Agree with Plan of Care Patient           Patient will benefit from skilled therapeutic intervention in order to improve the following deficits and impairments:  Abnormal gait, Difficulty walking, Decreased safety awareness, Decreased balance, Decreased mobility, Decreased strength  Visit Diagnosis: Muscle weakness (generalized)  Unsteadiness on feet  Other abnormalities of gait and mobility     Problem List Patient Active Problem List   Diagnosis Date Noted  . Colitis 03/15/2020  . AMS (altered mental status) 10/05/2019  . Acute  metabolic encephalopathy 81/82/9937  . Headache, unspecified 09/28/2019  . Abnormality of gait 09/13/2019  . Diabetes mellitus with end-stage renal disease ([place]) 08/07/2019  . Benign hypertensive kidney disease with chronic kidney disease 08/07/2019  . Hypothyroidism, unspecified 07/15/2019  . Spastic hemiparesis ([place]) 07/12/2019  . Dysphagia, oropharyngeal phase 07/05/2019  . Allergy, unspecified,  initial encounter 06/27/2019  . PAF (paroxysmal atrial fibrillation) ([place])   . Bacteremia   . Labile blood pressure   . Labile blood glucose   . Controlled type 2 diabetes mellitus with hyperglycemia, without long-term current use of insulin ([place])   . ESRD on dialysis ([place])   . Right sided weakness   . SDH (subdural hematoma) ([place])   . Chronic intracranial subdural hematoma ([facility]) 06/09/2019  . Multiple myeloma not having achieved remission ([place]) 06/09/2019  . ESRD on hemodialysis ([place]) 06/09/2019  . Essential hypertension 06/09/2019  . DM type 2 (diabetes mellitus, type 2) ([place]) 06/09/2019  . Seizure disorder ([place]) 06/09/2019  . Aspiration pneumonia ([place]) 06/09/2019  . History of bacteremia 06/09/2019  . Atrial fibrillation, chronic ([place]) 06/09/2019  . Bilateral kidney stones 05/31/2019  . Proteus infection 05/28/2019  . History of restrictive pulmonary disease 04/07/2019  . Multiple myeloma ([place]) 05/17/2018  . Goals of care, counseling/discussion 05/17/2018  . Partial idiopathic epilepsy with seizures of localized onset, intractable, without status epilepticus ([place]) 11/02/2017  . Iron deficiency anemia, unspecified 01/22/2017  . COPD (chronic obstructive pulmonary disease) ([place]) 11/11/2016  . CVA (cerebral vascular accident) ([place]) 11/11/2016  . GERD (gastroesophageal reflux disease) 11/11/2016  . GI bleed 11/11/2016  . 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  . Obesity (BMI 30-39.9) 11/11/2016  . Pulmonary hypertension ([place]) 11/11/2016  . Patent foramen ovale 11/11/2016  . Hyperparathyroidism, secondary renal ([place]) 11/11/2016  . VTE (venous thromboembolism) 11/11/2016  . Hypotension 11/11/2016  . Systemic hypertension 11/11/2016  . Seizure ([place]) 11/11/2016  . Other seizures ([place]) 07/25/2016  . Encounter for removal of sutures 05/27/2016  . Ingrown nail 03/03/2016  . Onychomycosis due to dermatophyte 03/03/2016  . Other acquired hammer toe 03/03/2016  . Hemiparesis  affecting right side as late effect of cerebrovascular accident ([place]) 02/28/2016  . Aphasia complicating stroke 16/96/7893  . Seizures ([place]) 01/08/2016  . History of ischemic left MCA stroke 01/08/2016  . Right sided weakness 01/08/2016  . Atrial fibrillation ([place]) 01/08/2016  . Leukocytosis 01/08/2016  . Benign essential HTN 01/08/2016  . Anemia of chronic disease 01/08/2016  . Controlled type 2 diabetes mellitus with diabetic nephropathy, without long-term current use of insulin ([place]) 01/08/2016  . History of DVT (deep vein thrombosis) 01/08/2016  . 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 Hypocalcemia 12/26/2015  . Long term (current) use of anticoagulants 11/07/2015  . Aftercare including intermittent dialysis ([place]) 11/02/2015  . Other specified coagulation defects ([place]) 11/02/2015  . Complication of vascular dialysis catheter 11/02/2015  . Diarrhea, unspecified 11/02/2015  . Fever, unspecified 11/02/2015  . Pruritus, unspecified 11/02/2015  . Shortness of breath 11/02/2015  . ESRD (end stage renal disease) on dialysis [facility])     Marisella Puccio W. 06/07/2020, 11:54 AM Frazier Butt., PT  [place] [address] [place], [place], [address] Phone: [phone]   Fax:  [phone]  Name: Isaiah [name] [facility] MRN: [id number] Date of Birth: [birth year]

## 2020-06-07 NOTE — Progress Notes (Signed)
[facility] Note  06/12/2020     CHIEF COMPLAINT Patient presents for Retina Follow Up   HISTORY OF PRESENT ILLNESS: Isaiah [name] is a 54 y.o. male who presents to the clinic today for:   HPI    Retina Follow Up    Patient presents with  Other.  In right eye.  This started weeks ago.  Severity is moderate.  Duration of weeks.  Since onset it is gradually worsening.  I, the attending physician,  performed the HPI with the patient and updated documentation appropriately.          Comments    Pt states vision is worse OD--states he has a hard time seeing TV screen.  Pt denies eye pain or discomfort and denies any new or worsening floaters or fol OU.       Last edited by [name], MD on 06/12/2020  9:33 AM. (History)    pt feels like his vision has gotten worse since last visit  Referring physician: [name], MD [address] [place],  [place] [address]  HISTORICAL INFORMATION:   Selected notes from the [facility] of Dr. [name].  Urgent re-referral from Dr. [name] for possible PDR w/NVI OD.  Presented to [facility]21 w/elevated IOPS and OD swelling and irritation.  Started on Brimonidine BID OD and Pred Q1hr OD.    CURRENT MEDICATIONS: Current Outpatient Medications (Ophthalmic Drugs)  Medication Sig  . brimonidine (ALPHAGAN) 0.2 % ophthalmic solution Place 1 drop into the right eye 2 (two) times daily.   . COSOPT 22.3-6.8 MG/ML ophthalmic solution Place 1 drop into the right eye 2 (two) times daily.   No current facility-administered medications for this visit. (Ophthalmic Drugs)   Current Outpatient Medications (Other)  Medication Sig  . acetaminophen (TYLENOL) 325 MG tablet Take 1-2 tablets (325-650 mg total) by mouth every 4 (four) hours as needed for mild pain.  [name] acyclovir (ZOVIRAX) 400 MG tablet TAKE 1 TABLET BY MOUTH ONCE DAILY  . albuterol (PROAIR HFA) 108 (90 Base) MCG/ACT inhaler  Inhale two puffs every 4-6 hours if needed for cough or wheeze (Patient taking differently: Inhale 2 puffs into the lungs every 4 (four) hours as needed for wheezing or shortness of breath. Inhale two puffs every 4-6 hours if needed for cough or wheeze)  . Alcohol Swabs (SM ALCOHOL PREP) 70 % PADS   . atorvastatin (LIPITOR) 20 MG tablet TAKE 1 TABLET BY MOUTH ONCE A DAY  . AURYXIA 1 GM 210 MG(Fe) tablet Take 210 mg by mouth 3 (three) times daily.  [name] BREO ELLIPTA 200-25 MCG/INH AEPB INHALE 1 PUFF INTO THE LUNGS AT THE SAME TIME EVERY DAY  . calcium acetate (PHOSLO) 667 MG capsule Take by mouth.  . camphor-menthol (SARNA) lotion Apply 1 application topically every 8 (eight) hours as needed for itching.  . Cetirizine HCl 10 MG CAPS Take 1 capsule by mouth daily.   . Cinacalcet HCl (SENSIPAR PO) Take by mouth.  . clonazePAM (KLONOPIN) 0.5 MG tablet Take 2 tablets every Monday, Wednesday, and Friday with hemodialysis  . FREESTYLE LITE test strip   . hydrOXYzine (ATARAX/VISTARIL) 25 MG tablet TAKE 1 TABLET (25 MG TOTAL) BY MOUTH EVERY 8 (EIGHT) HOURS AS NEEDED FOR ITCHING.  . Insulin Glargine (BASAGLAR KWIKPEN) 100 UNIT/ML Inject 10 Units into the skin daily.   . Insulin Lispro (HUMALOG KWIKPEN [place]) Inject 2-4 Units into the skin 3 (three)  times daily before meals. Sliding scale: 150-250 =2 units, 250 -350=3 units, 350 += 4 units  . lacosamide (VIMPAT) 200 MG TABS tablet Take 1 tablet twice a day except on dialysis days M-W-F give 1 tab in AM, 1 tab after dialysis, 1 tab in PM.  . lamoTRIgine (LAMICTAL) 200 MG tablet Take 1 tablet (200 mg total) by mouth 2 (two) times daily.  . Lancets (FREESTYLE) lancets   . metoprolol tartrate (LOPRESSOR) 25 MG tablet Take 25 mg by mouth daily.  . metroNIDAZOLE (FLAGYL) 250 MG tablet Take 1 tablet (250 mg total) by mouth every 8 (eight) hours.  . midodrine (PROAMATINE) 10 MG tablet TAKE 1 TABLET BY MOUTH TWICE DAILY WITH MEALS  . multivitamin (RENA-VIT) TABS tablet  Take 1 tablet by mouth at bedtime.  . Nutritional Supplements (FEEDING SUPPLEMENT, NEPRO CARB STEADY,) LIQD Take 237 mLs by mouth 3 (three) times daily as needed (Supplement).  . pantoprazole (PROTONIX) 40 MG tablet TAKE 1 TABLET BY MOUTH ONCE DAILY  . prochlorperazine (COMPAZINE) 10 MG tablet TAKE 1 TABLET (10 MG TOTAL) BY MOUTH EVERY 6 (SIX) HOURS AS NEEDED FOR NAUSEA OR VOMITING.  [name] UNIFINE PENTIPS 31G X 8 MM MISC USE AS DIRECTED DAILY AFTER SUPPER   No current facility-administered medications for this visit. (Other)   REVIEW OF SYSTEMS: ROS    Positive for: Neurological, Genitourinary, Endocrine, Eyes   Negative for: Constitutional, Gastrointestinal, Skin, Musculoskeletal, HENT, Cardiovascular, Respiratory, Psychiatric, Allergic/Imm, Heme/Lymph   Last edited by Doneen Poisson on 06/12/2020  9:07 AM. (History)     ALLERGIES Allergies  Allergen Reactions  . Codeine Swelling  . Codeine Rash and Other (See Comments)    Unknown reaction (patient says it was more serious than just a rash, but he can't remember what happened)    PAST MEDICAL HISTORY Past Medical History:  Diagnosis Date  . A-fib ([place])   . Anemia   . Asthma   . DM type 2 (diabetes mellitus, type 2) ([place]) 06/09/2019  . ESRD (end stage renal disease) on dialysis ([place])   . Essential hypertension 06/09/2019  . GIB (gastrointestinal bleeding)    Recurrent episodes- 09/2014, 09/2015 and 05/2016  . Gout   . History of recent blood transfusion 10/27/14   2 Units PRBC's  . Hyperkalemia 07/2011  . Multiple myeloma ([place])   . OSA on CPAP   . Pulmonary embolism ([place]) 07/2011; 09/27/2014   a. Bilat PE 07/2011 - unclear cause, tx with 6 months Coumadin.;   . Seizure disorder ([place]) 06/09/2019  . Sepsis ([place])   . Sickle cell-thalassemia disease ([place])    a. Sickle cell trait.  . Sleep apnea   . Stroke ([place]) 09/2015   R-MCA, L-MCA, PCA and bilateral cerebellar complicated by DVT/PE  . Subdural hematoma ([place]) 05/2019   Past  Surgical History:  Procedure Laterality Date  . AV FISTULA PLACEMENT Right 12/05/2015   Procedure: INSERTION OF ARTERIOVENOUS (AV) GORE-TEX GRAFT ARM;  Surgeon: Elam Dutch, MD;  Location: [facility];  Service: Vascular;  Laterality: Right;  . BONE MARROW BIOPSY    . CATARACT EXTRACTION  08/2019  . CATARACT EXTRACTION  09/2019  . CHOLECYSTECTOMY    . CHOLECYSTECTOMY  1990's?  . COLONOSCOPY WITH PROPOFOL N/A 01/22/2017   Procedure: COLONOSCOPY WITH PROPOFOL;  Surgeon: Wilford Corner, MD;  Location: [facility] ENDOSCOPY;  Service: Endoscopy;  Laterality: N/A;  . EYE SURGERY Right   . INSERTION OF DIALYSIS CATHETER Right 10/16/2015   Procedure: INSERTION OF PALINDROME DIALYSIS  CATHETER ;  Surgeon: Elam Dutch, MD;  Location: [facility];  Service: Vascular;  Laterality: Right;  . OTHER SURGICAL HISTORY     Retinal surgery  . PARS PLANA VITRECTOMY  02/17/2012   Procedure: PARS PLANA VITRECTOMY WITH 25 GAUGE;  Surgeon: Hayden Pedro, MD;  Location: [facility];  Service: Ophthalmology;  Laterality: Right;  . PERIPHERAL VASCULAR CATHETERIZATION N/A 03/20/2016   Procedure: A/V Shuntogram/Fistulagram;  Surgeon: Conrad Sublette, MD;  Location: [facility] CV [facility];  Service: Cardiovascular;  Laterality: N/A;  . TEE WITHOUT CARDIOVERSION N/A 12/02/2016   Procedure: TRANSESOPHAGEAL ECHOCARDIOGRAM (TEE);  Surgeon: Larey Dresser, MD;  Location: [facility] ENDOSCOPY;  Service: Cardiovascular;  Laterality: N/A;   FAMILY HISTORY Family History  Problem Relation Age of Onset  . Hypertension Mother   . Hypertension Father   . Stroke Father   . Sickle cell anemia Brother   . Diabetes Mother   . Sickle cell anemia Brother   . Diabetes type I Brother   . Kidney disease Brother    SOCIAL HISTORY Social History   Tobacco Use  . Smoking status: Never Smoker  . Smokeless tobacco: Never Used  Vaping Use  . Vaping Use: Never used  Substance Use Topics  . Alcohol use: Not Currently  . Drug use: Never         OPHTHALMIC  EXAM:  Base Eye Exam    Visual Acuity (Snellen - Linear)      Right Left   Dist cc 20/200 -1 20/100 -2   Dist ph cc 20/200 +2 NI       Tonometry (Tonopen, 9:11 AM)      Right Left   Pressure 17 16       Pupils      Dark Light Shape React APD   Right 3 2 Round Minimal 0   Left 3 2 Round Minimal 0       Visual Fields      Left Right    Full    Restrictions  Partial outer inferior temporal deficiency  Poor fixation.  Unreliable.       Extraocular Movement      Right Left    Full Full       Neuro/Psych    Oriented x3: Yes   Mood/Affect: Normal       Dilation    Both eyes: 1.0% Mydriacyl, 2.5% Phenylephrine @ 9:11 AM        Slit Lamp and Fundus Exam    Slit Lamp Exam      Right Left   Lids/Lashes Dermato Dermato   Conjunctiva/Sclera Melanosis Melanosis   Cornea Arcus, well healed cataract wound, 3+ PEE, decreased TBUT, Debris in tear film, trace endo pigment 3+ PEE, mild cataract wounds   Anterior Chamber Deep, 0.5+pigment Deep.  0.5+ pigment   Iris Round and poorly dilated to 4.34m, No NVI Round and moderately dilated to 5.050m No NVI   Lens PC IOL in good position, trace, fine pigment on optic PCIOL   Vitreous Post vitrectomy.  Residual anterior vitreous syneresis.  +RBC - improved, +pigment Mild asteroid hyalosis, syneresis       Fundus Exam      Right Left   Disc 2+pallor. Sharp rim. Hazy view.  Perfused.   C/D Ratio 0.7 0.5   Macula Blunted foveal reflex. atrophic; ischemic Hazy view.  Grossly flat.   Vessels Attenuated, tortuous, severe midzonal attenuation and ischemia Attenuated, tortuous   Periphery Attached, Peripheral laser scars,  good posterior fill in 360, Scattered [place] Attached.  360 PRP        Refraction    Wearing Rx      Sphere Cylinder Axis Add   Right -1.75 +0.50 090 +2.50   Left -1.50 Sphere  +2.50          IMAGING AND PROCEDURES  Imaging and Procedures for 06/12/2020  OCT, Retina - OU - Both Eyes       Right Eye Quality  was borderline. Central Foveal Thickness: 246. Progression has been stable. Findings include normal foveal contour, no IRF, no SRF, outer retinal atrophy, vitreomacular adhesion , epiretinal membrane (Stable improvement in vitreous opacities; diffuse retinal atrophy).   Left Eye Quality was borderline. Central Foveal Thickness: 251. Progression has been stable. Findings include normal foveal contour, no SRF (Diffuse retinal atrophy/thinning; stable retinoschisis temporal midzone caught on widefield).   Notes *Images captured and stored on drive  Diagnosis / Impression:  OD: stable improvement in vitreous opacities; diffuse retinal atrophy OS: Diffuse retinal atrophy/thinning; retinoschisis temporal midzone caught on widefield -- stable  Clinical management:  See below  Abbreviations: NFP - Normal foveal profile. CME - cystoid macular edema. PED - pigment epithelial detachment. IRF - intraretinal fluid. SRF - subretinal fluid. EZ - ellipsoid zone. ERM - epiretinal membrane. ORA - outer retinal atrophy. ORT - outer retinal tubulation. SRHM - subretinal hyper-reflective material. IRHM - intraretinal hyper-reflective material                ASSESSMENT/PLAN:    ICD-10-CM   1. Vitreous hemorrhage, right eye (HCC)  H43.11   2. Hyphema of right eye  H21.01   3. Proliferative diabetic retinopathy of both eyes without macular edema associated with type 2 diabetes mellitus ([place])  V03.5009   4. Retinal edema  H35.81 OCT, Retina - OU - Both Eyes  5. Sickle cell retinopathy without crisis ([place])  D57.1    H36   6. Essential hypertension  I10   7. Hypertensive retinopathy of both eyes  H35.033   8. Pseudophakia of both eyes  Z96.1   9. Bilateral dry eyes  H04.123    1,2. VH & microyphema OD secondary to PDR -- stably resolved  - s/p IVA OD #1 (07.29.21)  - VH and microhyphema stably resolved OD -- back to baseline (BCVA 20/200 OD)  - NVI OD regressed      - PF tapered off      - cont  Cosopt BID OD  - VH precautions reviewed -- minimize activities, keep head elevated, avoid ASA/NSAIDs/blood thinners as able  - F/u in 3 months w/ DFE/OCT  3,4. Proliferative diabetic retinopathy w/ retinal ischemia, no DME, OU (OD > OS)  - pt of Dr. [name], last visit 03.04.21  - history of PPV w/ endolaser, FAX OD 6.11.13; PRP OS 2020  - s/p IVA OD #1 (07.29.21) as above for new VH and microhyphema  - s/p PRP OD (08.24.21)  - exam shows NVI regressed and VH improved OD; OS with good 360 PRP and +asteroid hyalosis  - OCT without diabetic macular edema, but diffuse atrophy, both eyes   - FA (08.12.21) shows severe vascular perfusion OD -- peripheral nonperfusion extends posteriorly just outside arcades   - F/u in 3 months w/DFE/OCT  5. History of Sickle Cell Retinopathy  - 360 PRP in place OU  - no active NV, confirmed on FA 8.12.21  6,7. Hypertensive retinopathy OU - discussed importance of tight BP control - monitor  8. Pseudophakia OU  - s/p CE/IOL OU (OD Jan 2021; OS Feb 2021  - IOL in good position, doing well  - monitor  9. Dry eyes OU  - large component of BCVA - recommend artificial tears and lubricating ointment as needed  Ophthalmic Meds Ordered this visit:  No orders of the defined types were placed in this encounter.     Return in about 3 months (around 09/12/2020) for f/u PDR OU, DFE, OCT.  There are no Patient Instructions on file for this visit.  Explained the diagnoses, plan, and follow up with the patient and they expressed understanding.  Patient expressed understanding of the importance of proper follow up care.  This document serves as a record of services personally performed by Gardiner Sleeper, MD, PhD. It was created on their behalf by Estill Bakes, COT an ophthalmic technician. The creation of this record is the provider's dictation and/or activities during the visit.    Electronically signed by: Estill Bakes, COT 9.30.21 @ 12:10 PM   This  document serves as a record of services personally performed by Gardiner Sleeper, MD, PhD. It was created on their behalf by San Jetty. Owens Shark, OA an ophthalmic technician. The creation of this record is the provider's dictation and/or activities during the visit.    Electronically signed by: San Jetty. Owens Shark, [place] 10.05.2021 12:10 PM  Gardiner Sleeper, M.D., Ph.D. Diseases & Surgery of the Retina and [place] 06/12/2020   I have reviewed the above documentation for accuracy and completeness, and I agree with the above. Gardiner Sleeper, M.D., Ph.D. 06/12/20 12:10 PM   Abbreviations: M myopia (nearsighted); A astigmatism; H hyperopia (farsighted); P presbyopia; Mrx spectacle prescription;  CTL contact lenses; OD right eye; OS left eye; OU both eyes  XT exotropia; ET esotropia; PEK punctate epithelial keratitis; PEE punctate epithelial erosions; DES dry eye syndrome; MGD meibomian gland dysfunction; ATs artificial tears; PFAT's preservative free artificial tears; [place] nuclear sclerotic cataract; PSC posterior subcapsular cataract; ERM epi-retinal membrane; PVD posterior vitreous detachment; RD retinal detachment; DM diabetes mellitus; DR diabetic retinopathy; NPDR non-proliferative diabetic retinopathy; PDR proliferative diabetic retinopathy; CSME clinically significant macular edema; DME diabetic macular edema; dbh dot blot hemorrhages; CWS cotton wool spot; POAG primary open angle glaucoma; C/D cup-to-disc ratio; HVF humphrey visual field; GVF goldmann visual field; OCT optical coherence tomography; IOP intraocular pressure; BRVO Branch retinal vein occlusion; CRVO central retinal vein occlusion; CRAO central retinal artery occlusion; BRAO branch retinal artery occlusion; RT retinal tear; SB scleral buckle; PPV pars plana vitrectomy; VH Vitreous hemorrhage; PRP panretinal laser photocoagulation; IVK intravitreal kenalog; VMT vitreomacular traction; MH Macular hole;  NVD  neovascularization of the disc; NVE neovascularization elsewhere; AREDS age related eye disease study; ARMD age related macular degeneration; POAG primary open angle glaucoma; EBMD epithelial/anterior basement membrane dystrophy; ACIOL anterior chamber intraocular lens; IOL intraocular lens; PCIOL posterior chamber intraocular lens; Phaco/IOL phacoemulsification with intraocular lens placement; [place] photorefractive keratectomy; LASIK laser assisted in situ keratomileusis; HTN hypertension; DM diabetes mellitus; COPD chronic obstructive pulmonary disease

## 2020-06-07 NOTE — Telephone Encounter (Signed)
Scheduled per 09/14 los, spoke with patient's spouse. Patient will be notified of upcoming appointment.

## 2020-06-08 ENCOUNTER — Other Ambulatory Visit: Payer: Self-pay | Admitting: Internal Medicine

## 2020-06-08 ENCOUNTER — Ambulatory Visit: Payer: Self-pay

## 2020-06-08 DIAGNOSIS — N186 End stage renal disease: Secondary | ICD-10-CM | POA: Diagnosis not present

## 2020-06-08 DIAGNOSIS — N2581 Secondary hyperparathyroidism of renal origin: Secondary | ICD-10-CM | POA: Diagnosis not present

## 2020-06-08 DIAGNOSIS — C9000 Multiple myeloma not having achieved remission: Secondary | ICD-10-CM

## 2020-06-08 DIAGNOSIS — D631 Anemia in chronic kidney disease: Secondary | ICD-10-CM | POA: Diagnosis not present

## 2020-06-08 DIAGNOSIS — Z992 Dependence on renal dialysis: Secondary | ICD-10-CM | POA: Diagnosis not present

## 2020-06-08 DIAGNOSIS — E1122 Type 2 diabetes mellitus with diabetic chronic kidney disease: Secondary | ICD-10-CM | POA: Diagnosis not present

## 2020-06-08 DIAGNOSIS — E1129 Type 2 diabetes mellitus with other diabetic kidney complication: Secondary | ICD-10-CM | POA: Diagnosis not present

## 2020-06-08 MED FILL — MIDODRINE HCL 10 MG TABS: 10 | 30 days supply | Qty: 60 | Fill #0

## 2020-06-08 NOTE — Chronic Care Management (AMB) (Signed)
Chronic Care Management    Social Work Follow Up Note  06/08/2020 Name: Isaiah [name] [facility] MRN: [id number] DOB: 02-09-1966  Isaiah [name] is a 54 y.o. year old male who is a primary care patient of [name], MD. The CCM team was consulted for assistance with care coordination.   Review of patient status, including review of consultants reports, other relevant assessments, and collaboration with appropriate care team members and the patient's provider was performed as part of comprehensive patient evaluation and provision of chronic care management services.    SDOH (Social Determinants of Health) assessments performed: No    Outpatient Encounter Medications as of 06/08/2020  Medication Sig  . acetaminophen (TYLENOL) 325 MG tablet Take 1-2 tablets (325-650 mg total) by mouth every 4 (four) hours as needed for mild pain.  [name] acyclovir (ZOVIRAX) 400 MG tablet TAKE 1 TABLET BY MOUTH ONCE DAILY  . albuterol (PROAIR HFA) 108 (90 Base) MCG/ACT inhaler Inhale two puffs every 4-6 hours if needed for cough or wheeze (Patient taking differently: Inhale 2 puffs into the lungs every 4 (four) hours as needed for wheezing or shortness of breath. Inhale two puffs every 4-6 hours if needed for cough or wheeze)  . Alcohol Swabs (SM ALCOHOL PREP) 70 % PADS   . atorvastatin (LIPITOR) 20 MG tablet TAKE 1 TABLET BY MOUTH ONCE A DAY  . AURYXIA 1 GM 210 MG(Fe) tablet Take 210 mg by mouth 3 (three) times daily.  [name] BREO ELLIPTA 200-25 MCG/INH AEPB INHALE 1 PUFF INTO THE LUNGS AT THE SAME TIME EVERY DAY  . brimonidine (ALPHAGAN) 0.2 % ophthalmic solution Place 1 drop into the right eye 2 (two) times daily.   . calcium acetate (PHOSLO) 667 MG capsule Take by mouth.  . camphor-menthol (SARNA) lotion Apply 1 application topically every 8 (eight) hours as needed for itching.  . Cetirizine HCl 10 MG CAPS Take 1 capsule by mouth daily.   . Cinacalcet HCl (SENSIPAR PO) Take by mouth.  . clonazePAM (KLONOPIN) 0.5 MG tablet  Take 2 tablets every Monday, Wednesday, and Friday with hemodialysis  . COSOPT 22.3-6.8 MG/ML ophthalmic solution Place 1 drop into the right eye 2 (two) times daily.  [name] FREESTYLE LITE test strip   . hydrOXYzine (ATARAX/VISTARIL) 25 MG tablet TAKE 1 TABLET (25 MG TOTAL) BY MOUTH EVERY 8 (EIGHT) HOURS AS NEEDED FOR ITCHING.  . Insulin Glargine (BASAGLAR KWIKPEN) 100 UNIT/ML Inject 10 Units into the skin daily.   . Insulin Lispro (HUMALOG KWIKPEN [place]) Inject 2-4 Units into the skin 3 (three) times daily before meals. Sliding scale: 150-250 =2 units, 250 -350=3 units, 350 += 4 units  . lacosamide (VIMPAT) 200 MG TABS tablet Take 1 tablet twice a day except on dialysis days M-W-F give 1 tab in AM, 1 tab after dialysis, 1 tab in PM.  . lamoTRIgine (LAMICTAL) 200 MG tablet Take 1 tablet (200 mg total) by mouth 2 (two) times daily.  . Lancets (FREESTYLE) lancets   . metoprolol tartrate (LOPRESSOR) 25 MG tablet Take 25 mg by mouth daily.  . metroNIDAZOLE (FLAGYL) 250 MG tablet Take 1 tablet (250 mg total) by mouth every 8 (eight) hours.  . midodrine (PROAMATINE) 10 MG tablet TAKE 1 TABLET BY MOUTH TWICE DAILY WITH MEALS  . multivitamin (RENA-VIT) TABS tablet Take 1 tablet by mouth at bedtime.  . Nutritional Supplements (FEEDING SUPPLEMENT, NEPRO CARB STEADY,) LIQD Take 237 mLs by mouth 3 (three) times daily as needed (Supplement).  . pantoprazole (PROTONIX) 40  MG tablet TAKE 1 TABLET BY MOUTH ONCE DAILY  . prochlorperazine (COMPAZINE) 10 MG tablet TAKE 1 TABLET (10 MG TOTAL) BY MOUTH EVERY 6 (SIX) HOURS AS NEEDED FOR NAUSEA OR VOMITING.  [name] UNIFINE PENTIPS 31G X 8 MM MISC USE AS DIRECTED DAILY AFTER SUPPER   No facility-administered encounter medications on file as of 06/08/2020.     Goals Addressed            This Visit's Progress   . COMPLETED: Collaborate with RN Care Manager to perform apprpriate assessments to determine care management and care coordination needs       Current Barriers:   . Care management and care coordination needs related to DMII, ESRD, and multiple myeloma  which resulted in inpatient hospitalization . ADL IADL limitations  Clinical Social Work Clinical Goal(s):  [name] Over the next 60 days, patient will collaborate with care management team to assist with care coordination needs in response to recent inpatient hospitalization . New 01/16/20- Over the next 20 days the patient and his spouse will work with SW to identify cause of Medicaid denial and initiate an appeal Goal Met . New 01/25/20 Over the next 60 days the patient and his wife will work with SW to address ongoing care coordination needs related to caregiver assistance Goal not met due to challenges with COVID 19 pandemic . New 05/24/20 Over the next week the patient will complete CAP/DA telephonic assessment  CCM SW Interventions: Completed 06/08/20 with [facility] . Inbound call received from [facility] who reports she is no longer interested in pursuing CAP/DA services for her husband due to information received that Medicaid will attempt to recoup funds provided to assist with patient care after death . Advised Mrs. [facility] SW will close SW goals at this time . Encouraged Mrs Washington to contact SW as needed for future care coordination needs  Completed 06/04/20 with [name] . Successful outbound call placed to patients spouse to assess goal progression . Determined the patient successfully completed telephonic RN assessment o Mrs. Osei reports they are awaiting determination of patient approval . Discussed plan to select a home care provider once the patient is deemed appropriate for services . Scheduled follow up call over the next 6 weeks o Advised Mrs. Brewton to contact SW sooner if needed prior to next scheduled call   Completed 05/24/20 with [facility] . Inbound call received from [facility] who reports the patient has a scheduled virtual assessment with the CAP/DA Nurse and Social  worker on September 23rd . Discussed the patient has yet to be informed whether or not he is approved for Medicaid . Assessed for acute care coordination needs-  no challenges noted at this time . Encouraged Mrs. Kliethermes to contact SW directly with outcome of telephonic assessments . Scheduled follow up call over the next month  Completed 05/02/20 with [facility] . Successful outbound call placed to Mrs. Greenstein to assess goal progression . Determined the patient has yet to have a CAP nursing assessment or receive a determination from [place] o Mrs. Bolle reports she is frustrated with this process and may not proceed if the application needs to be re-submitted again as it did in the past . Discussed barriers to caregiver resources due to ongoing pandemic . Encouraged Mrs. Brackeen to contact SW as needed with ongoing care coordination needs . Goal closed  Completed 04/05/20 with [facility] . Successful outbound call placed to the patients spouse to assess goal progression  .  Informed by Mrs. Muns the patient was just seen by Dr. Coralyn Pear to address a bleed behind the patients right eye o Patient received an injections and prescription for eye drops o Instructed to return in 1 week for a re-check o Collaboration with RN Care Manager regarding recent update to patients health history . Determined Mrs. Mario was contacted by Mrs. Larence Penning with the in home aide office requesting a new Medicaid application be completed o Mrs. Ciesla completed this application and submitted to Mrs. Larence Penning approximately 1 week ago . Discussed concern with staffing challenges which is delaying patients CAP assessment . Determined m[facility]. [name] has yet to be told when she will need to return to the office setting for full-time hours but she is concerned she may not be able to continue remote work indefinitely to assist with patient care needs . Scheduled follow up call over the next month to assess goal  progression o Encouraged Mrs. Crumbley to contact SW and or Consulting civil engineer as needed prior to next scheduled call  Completed 03/20/20 . Successful outbound call placed to [facility] in response to voice message received . Mrs. Cominsky reports she is still awaiting assessment for CAP services . Mrs. Grassel was contacted by Mrs. Larence Penning with the CAP office who stated they are short staffed but are hoping to be able to complete assessment in the coming weeks  Completed 02/29/20 with [facility] . Successful outbound call placed to the patients spouse to review goal progression . Determined Mrs. [facility] has been in contact with Ilda Mori (contact made on 6/22)  who has assigned a nurse to perform an assessment to determine patient eligibility for CAP/DA services o Mrs. Larence Penning informed Mrs. Orlov to expect a call over the next 10 days to schedule assessment . Encouraged Mrs. Marcussen to contact SW with outcome of assessment and/or if further SW assistance is needed  . Scheduled follow up call over the next 6 weeks   Patient Self Care Activities:  . Attends all scheduled provider appointments . Calls pharmacy for medication refills . Calls provider office for new concerns or questions . Supportive wife and caregiver to assist with patient care needs   Please see past updates related to this goal by clicking on the "Past Updates" button in the selected goal         Follow Up Plan: No SW follow up planned at this time. The patient will remain active with RN Care Manager.   [name], BSW, CDP Social Worker, Certified Dementia Practitioner [place] [facility] [place] [facility] [phone]

## 2020-06-08 NOTE — Patient Instructions (Signed)
Social Worker Visit Information  Goals we discussed today:  Goals Addressed            This Visit's Progress   . COMPLETED: Collaborate with RN Care Manager to perform apprpriate assessments to determine care management and care coordination needs       Current Barriers:  . Care management and care coordination needs related to DMII, ESRD, and multiple myeloma  which resulted in inpatient hospitalization . ADL IADL limitations  Clinical Social Work Clinical Goal(s):  [name] Over the next 60 days, patient will collaborate with care management team to assist with care coordination needs in response to recent inpatient hospitalization . New 01/16/20- Over the next 20 days the patient and his spouse will work with SW to identify cause of Medicaid denial and initiate an appeal Goal Met . New 01/25/20 Over the next 60 days the patient and his wife will work with SW to address ongoing care coordination needs related to caregiver assistance Goal not met due to challenges with COVID 19 pandemic . New 05/24/20 Over the next week the patient will complete CAP/DA telephonic assessment  CCM SW Interventions: Completed 06/08/20 with [name] . Inbound call received from [facility] who reports she is no longer interested in pursuing CAP/DA services for her husband due to information received that Medicaid will attempt to recoup funds provided to assist with patient care after death . Advised Mrs. [facility] SW will close SW goals at this time . Encouraged Mrs Washington to contact SW as needed for future care coordination needs  Completed 06/04/20 with [facility] . Successful outbound call placed to patients spouse to assess goal progression . Determined the patient successfully completed telephonic RN assessment o Mrs. Mazurek reports they are awaiting determination of patient approval . Discussed plan to select a home care provider once the patient is deemed appropriate for services . Scheduled follow up call  over the next 6 weeks o Advised Mrs. Goleman to contact SW sooner if needed prior to next scheduled call   Completed 05/24/20 with [facility] . Inbound call received from [facility] who reports the patient has a scheduled virtual assessment with the CAP/DA Nurse and Social worker on September 23rd . Discussed the patient has yet to be informed whether or not he is approved for Medicaid . Assessed for acute care coordination needs-  no challenges noted at this time . Encouraged Mrs. Lufkin to contact SW directly with outcome of telephonic assessments . Scheduled follow up call over the next month  Completed 05/02/20 with [facility] . Successful outbound call placed to Mrs. Newsham to assess goal progression . Determined the patient has yet to have a CAP nursing assessment or receive a determination from [place] o Mrs. Krummel reports she is frustrated with this process and may not proceed if the application needs to be re-submitted again as it did in the past . Discussed barriers to caregiver resources due to ongoing pandemic . Encouraged Mrs. Blecha to contact SW as needed with ongoing care coordination needs . Goal closed  Completed 04/05/20 with [facility] . Successful outbound call placed to the patients spouse to assess goal progression  . Informed by Mrs. Oelke the patient was just seen by Dr. Coralyn Pear to address a bleed behind the patients right eye o Patient received an injections and prescription for eye drops o Instructed to return in 1 week for a re-check o Collaboration with RN Care Manager regarding recent update to patients health history .  Determined Mrs. Naab was contacted by Mrs. Larence Penning with the in home aide office requesting a new Medicaid application be completed o Mrs. Lavine completed this application and submitted to Mrs. Larence Penning approximately 1 week ago . Discussed concern with staffing challenges which is delaying patients CAP assessment . Determined m[facility]. [facility] has yet  to be told when she will need to return to the office setting for full-time hours but she is concerned she may not be able to continue remote work indefinitely to assist with patient care needs . Scheduled follow up call over the next month to assess goal progression o Encouraged Mrs. Fahr to contact SW and or Consulting civil engineer as needed prior to next scheduled call  Completed 03/20/20 . Successful outbound call placed to [facility] in response to voice message received . Mrs. Craver reports she is still awaiting assessment for CAP services . Mrs. Creelman was contacted by Mrs. Larence Penning with the CAP office who stated they are short staffed but are hoping to be able to complete assessment in the coming weeks  Completed 02/29/20 with [name] . Successful outbound call placed to the patients spouse to review goal progression . Determined Mrs. [facility] has been in contact with Ilda Mori (contact made on 6/22)  who has assigned a nurse to perform an assessment to determine patient eligibility for CAP/DA services o Mrs. Larence Penning informed Mrs. Biebel to expect a call over the next 10 days to schedule assessment . Encouraged Mrs. Battiste to contact SW with outcome of assessment and/or if further SW assistance is needed  . Scheduled follow up call over the next 6 weeks   Patient Self Care Activities:  . Attends all scheduled provider appointments . Calls pharmacy for medication refills . Calls provider office for new concerns or questions . Supportive wife and caregiver to assist with patient care needs   Please see past updates related to this goal by clicking on the "Past Updates" button in the selected goal         Follow Up Plan: No SW follow up planned at this time. The patient will remain active with RN Care Manager.   [name], BSW, CDP Social Worker, Certified Dementia Practitioner [place] [facility] [place] [facility] [phone]

## 2020-06-09 MED FILL — SUBVENITE 200 MG TABS: 200 | 30 days supply | Qty: 60 | Fill #8

## 2020-06-09 MED FILL — ATORVASTATIN CALCIUM 20 MG: 20 | 30 days supply | Qty: 30 | Fill #0

## 2020-06-11 DIAGNOSIS — D631 Anemia in chronic kidney disease: Secondary | ICD-10-CM | POA: Diagnosis not present

## 2020-06-11 DIAGNOSIS — Z992 Dependence on renal dialysis: Secondary | ICD-10-CM | POA: Diagnosis not present

## 2020-06-11 DIAGNOSIS — E1129 Type 2 diabetes mellitus with other diabetic kidney complication: Secondary | ICD-10-CM | POA: Diagnosis not present

## 2020-06-11 DIAGNOSIS — N186 End stage renal disease: Secondary | ICD-10-CM | POA: Diagnosis not present

## 2020-06-11 DIAGNOSIS — N2581 Secondary hyperparathyroidism of renal origin: Secondary | ICD-10-CM | POA: Diagnosis not present

## 2020-06-12 ENCOUNTER — Ambulatory Visit: Payer: Medicare Other | Attending: Physical Medicine & Rehabilitation | Admitting: Physical Therapy

## 2020-06-12 ENCOUNTER — Other Ambulatory Visit: Payer: Self-pay

## 2020-06-12 ENCOUNTER — Encounter (INDEPENDENT_AMBULATORY_CARE_PROVIDER_SITE_OTHER): Payer: Self-pay | Admitting: Ophthalmology

## 2020-06-12 ENCOUNTER — Ambulatory Visit (INDEPENDENT_AMBULATORY_CARE_PROVIDER_SITE_OTHER): Payer: Medicare Other | Admitting: Ophthalmology

## 2020-06-12 DIAGNOSIS — H04123 Dry eye syndrome of bilateral lacrimal glands: Secondary | ICD-10-CM

## 2020-06-12 DIAGNOSIS — S065X9A Traumatic subdural hemorrhage with loss of consciousness of unspecified duration, initial encounter: Secondary | ICD-10-CM | POA: Insufficient documentation

## 2020-06-12 DIAGNOSIS — R278 Other lack of coordination: Secondary | ICD-10-CM | POA: Diagnosis not present

## 2020-06-12 DIAGNOSIS — S065XAA Traumatic subdural hemorrhage with loss of consciousness status unknown, initial encounter: Secondary | ICD-10-CM

## 2020-06-12 DIAGNOSIS — Z961 Presence of intraocular lens: Secondary | ICD-10-CM

## 2020-06-12 DIAGNOSIS — H4311 Vitreous hemorrhage, right eye: Secondary | ICD-10-CM | POA: Diagnosis not present

## 2020-06-12 DIAGNOSIS — D571 Sickle-cell disease without crisis: Secondary | ICD-10-CM | POA: Diagnosis not present

## 2020-06-12 DIAGNOSIS — R2681 Unsteadiness on feet: Secondary | ICD-10-CM

## 2020-06-12 DIAGNOSIS — R41844 Frontal lobe and executive function deficit: Secondary | ICD-10-CM | POA: Insufficient documentation

## 2020-06-12 DIAGNOSIS — I10 Essential (primary) hypertension: Secondary | ICD-10-CM

## 2020-06-12 DIAGNOSIS — E113593 Type 2 diabetes mellitus with proliferative diabetic retinopathy without macular edema, bilateral: Secondary | ICD-10-CM | POA: Diagnosis not present

## 2020-06-12 DIAGNOSIS — R2689 Other abnormalities of gait and mobility: Secondary | ICD-10-CM | POA: Insufficient documentation

## 2020-06-12 DIAGNOSIS — H35033 Hypertensive retinopathy, bilateral: Secondary | ICD-10-CM

## 2020-06-12 DIAGNOSIS — M6281 Muscle weakness (generalized): Secondary | ICD-10-CM | POA: Insufficient documentation

## 2020-06-12 DIAGNOSIS — H3581 Retinal edema: Secondary | ICD-10-CM

## 2020-06-12 DIAGNOSIS — M25631 Stiffness of right wrist, not elsewhere classified: Secondary | ICD-10-CM | POA: Diagnosis not present

## 2020-06-12 DIAGNOSIS — H2101 Hyphema, right eye: Secondary | ICD-10-CM | POA: Diagnosis not present

## 2020-06-12 DIAGNOSIS — H36 Retinal disorders in diseases classified elsewhere: Secondary | ICD-10-CM | POA: Diagnosis not present

## 2020-06-12 DIAGNOSIS — R41840 Attention and concentration deficit: Secondary | ICD-10-CM | POA: Diagnosis not present

## 2020-06-12 DIAGNOSIS — R41842 Visuospatial deficit: Secondary | ICD-10-CM | POA: Diagnosis not present

## 2020-06-12 NOTE — Therapy (Signed)
[facility] [address] [place], [place], [address] Phone: [phone]   Fax:  [phone]  Physical Therapy Treatment  Patient Details  Name: Isaiah [name] [facility] MRN: [id number] Date of Birth: [birth year] Referring Provider (PT): Delice Lesch, MD   Encounter Date: 06/12/2020   PT End of Session - 06/12/20 1501    Visit Number 14    Number of Visits 27   per recent recert wk of 5/03   Date for PT Re-Evaluation 08/30/20    Authorization Type Medicare; PN/Recert 5/46/56    Progress Note Due on Visit 20    PT Start Time 1451    PT Stop Time 1540    PT Time Calculation (min) 49 min    Equipment Utilized During Treatment Gait belt    Activity Tolerance Patient tolerated treatment well;No increased pain    Behavior During Therapy [facility] for tasks assessed/performed;Flat affect           Past Medical History:  Diagnosis Date  . A-fib ([place])   . Anemia   . Asthma   . DM type 2 (diabetes mellitus, type 2) ([place]) 06/09/2019  . ESRD (end stage renal disease) on dialysis ([place])   . Essential hypertension 06/09/2019  . GIB (gastrointestinal bleeding)    Recurrent episodes- 09/2014, 09/2015 and 05/2016  . Gout   . History of recent blood transfusion 10/27/14   2 Units PRBC's  . Hyperkalemia 07/2011  . Multiple myeloma ([place])   . OSA on CPAP   . Pulmonary embolism ([place]) 07/2011; 09/27/2014   a. Bilat PE 07/2011 - unclear cause, tx with 6 months Coumadin.;   . Seizure disorder ([place]) 06/09/2019  . Sepsis ([place])   . Sickle cell-thalassemia disease ([place])    a. Sickle cell trait.  . Sleep apnea   . Stroke ([place]) 09/2015   R-MCA, L-MCA, PCA and bilateral cerebellar complicated by DVT/PE  . Subdural hematoma ([place]) 05/2019    Past Surgical History:  Procedure Laterality Date  . AV FISTULA PLACEMENT Right 12/05/2015   Procedure: INSERTION OF ARTERIOVENOUS (AV) GORE-TEX GRAFT ARM;  Surgeon: Elam Dutch, MD;  Location: [place] [facility];  Service:  Vascular;  Laterality: Right;  . BONE MARROW BIOPSY    . CATARACT EXTRACTION  08/2019  . CATARACT EXTRACTION  09/2019  . CHOLECYSTECTOMY    . CHOLECYSTECTOMY  1990's?  . COLONOSCOPY WITH PROPOFOL N/A 01/22/2017   Procedure: COLONOSCOPY WITH PROPOFOL;  Surgeon: Wilford Corner, MD;  Location: [facility];  Service: Endoscopy;  Laterality: N/A;  . EYE SURGERY Right   . INSERTION OF DIALYSIS CATHETER Right 10/16/2015   Procedure: INSERTION OF PALINDROME DIALYSIS CATHETER ;  Surgeon: Elam Dutch, MD;  Location: [place];  Service: Vascular;  Laterality: Right;  . OTHER SURGICAL HISTORY     Retinal surgery  . PARS PLANA VITRECTOMY  02/17/2012   Procedure: PARS PLANA VITRECTOMY WITH 25 GAUGE;  Surgeon: Hayden Pedro, MD;  Location: [facility];  Service: Ophthalmology;  Laterality: Right;  . PERIPHERAL VASCULAR CATHETERIZATION N/A 03/20/2016   Procedure: A/V Shuntogram/Fistulagram;  Surgeon: Conrad Howard, MD;  Location: [facility];  Service: Cardiovascular;  Laterality: N/A;  . TEE WITHOUT CARDIOVERSION N/A 12/02/2016   Procedure: TRANSESOPHAGEAL ECHOCARDIOGRAM (TEE);  Surgeon: Larey Dresser, MD;  Location: [place];  Service: Cardiovascular;  Laterality: N/A;    There were no vitals filed for this visit.   Subjective Assessment - 06/12/20 1500    Subjective Pt states  he got his eyes dilated today as well. Pt reports he's been walking daily for ~10 min a day.    Pertinent History multiple myeloma, ESRD on dialysis, DM type II, DVT with IVC filter, CVA, COPD, a-fib; chemo treatments down to 1x/month (next is August 17th)    Patient Stated Goals To get back to walk without the walker, back to his baseline    Currently in Pain? No/denies                             [name] Adult PT Treatment/Exercise - 06/12/20 0001      Ambulation/Gait   Ambulation/Gait Yes    Ambulation/Gait Assistance 4: Min guard    Ambulation Distance (Feet) 80 Feet   x2   Assistive device  Rolling walker    Gait Pattern Step-through pattern;Trunk flexed;Poor foot clearance - left;Poor foot clearance - right    Ambulation Surface Level;Indoor    Gait Comments cues for heel strike      Knee/Hip Exercises: Aerobic   Stepper Seated SciFit Stepper, Level 3, LE only x 7 minutes. Performed for improved lower extremity endurance.   cues to remain 40-60 RPM     Knee/Hip Exercises: Standing   Hip Abduction Stengthening;Right;Left;2 sets;10 reps;Knee straight    Hip Extension Stengthening;Right;Left;2 sets;10 reps;Knee straight    Forward Step Up Right;Left;1 set;Hand Hold: 2;Step Height: 6";20 reps               Balance Exercises - 06/12/20 0001      Balance Exercises: Standing   SLS with Vectors Solid surface;Other reps (comment);Limitations;Upper extremity assist 1   x10 forward and lateral slides bilaterally   Standing, One Foot on a Step Eyes open;5 reps;6 inch;Limitations;10 secs    Standing, One Foot on a Step Limitations 10 sec hold without UE support    Heel Raises Both;20 reps    Other Standing Exercises Obstacle course stepping over 4x and stepping around cones x8   Difficulty judging distance for safe step over; requires cue              PT Short Term Goals - 06/05/20 2149      PT SHORT TERM GOAL #1   Title Pt will perform HEP independenlty for improved strength, balance and gait.    Baseline performs, but needs supervision for safety in standing    Time 4    Period Weeks    Status Partially Met    Target Date 06/28/20      PT SHORT TERM GOAL #2   Title Pt will improve gait velocity to at least 1.3 ft/sec for improved gait efficiency and safety.    Baseline 1.10f/s with RW 05/31/20    Time 4    Period Weeks    Status On-going    Target Date 06/28/20             PT Long Term Goals - 06/05/20 2150      PT LONG TERM GOAL #1   Title Pt will be independent with progression of HEP for improved strength, blaance, gait.    Time 8    Period Weeks     Status On-going    Target Date 07/26/20      PT LONG TERM GOAL #2   Title Pt will imrpove Berg Balance score to at least 33/56 for decreased fall risk.    Baseline 30/56 05/31/20    Time 8  Period Weeks    Status On-going    Target Date 07/26/20      PT LONG TERM GOAL #3   Title Pt will improve TUG score to less than or equal to 30 seconds for decreased fall risk.    Baseline 37 sec 05/31/20    Time 8    Period Weeks    Status On-going    Target Date 07/26/20      PT LONG TERM GOAL #4   Title Pt will improve gait velocity to at least 1.8 ft/sec for improved gait velocity and decreased fall risk.    Baseline 1.12 ft/s 05/31/20    Time 8    Period Weeks    Status On-going    Target Date 07/26/20      PT LONG TERM GOAL #5   Title Pt will ascend/descent 12 steps with both handrails with supervision only, to negotiate steps at home.    Baseline not attempted 05/31/20    Time 8    Period Weeks    Status On-going    Target Date 07/26/20                 Plan - 06/12/20 1556    Clinical Impression Statement Limited ability to avoid obstacles and step over obstacles possibly due to eye dilation. Treatment session focused on strength, balance, and endurance. Pt able to tolerate SciFit with LE use only for 7 min. Pt continues to require B UE support for safety with standing balance activities. Pt required cueing to safely judge distance to step over obstacle.    Personal Factors and Comorbidities Comorbidity 3+    Comorbidities PMH:  multiple myeloma, ESRD on dialysis, DM type II, DVT with IVC filter, CVA, COPD, a-fib    Examination-Activity Limitations Locomotion Level;Transfers;Stairs;Stand    Examination-Participation Restrictions Community Activity    Stability/Clinical Decision Making Evolving/Moderate complexity    Rehab Potential Good    PT Frequency 2x / week    PT Duration 8 weeks   per recert 0/17/79   PT Treatment/Interventions ADLs/Self Care Home Management;DME  Instruction;Neuromuscular re-education;Balance training;Therapeutic exercise;Therapeutic activities;Functional mobility training;Stair training;Gait training;Patient/family education    PT Next Visit Plan Try to ask about scheduling additional appointments (he should be 2x/wk for at least 6 more weeks-there are several weeks he doesn't have PT); gait trianing with obstacle negotiation; functional strength and balance exercises    PT Home Exercise Plan Access Code [email or web address]    Consulted and Agree with Plan of Care Patient           Patient will benefit from skilled therapeutic intervention in order to improve the following deficits and impairments:  Abnormal gait, Difficulty walking, Decreased safety awareness, Decreased balance, Decreased mobility, Decreased strength  Visit Diagnosis: Muscle weakness (generalized)  Unsteadiness on feet  Other abnormalities of gait and mobility  SDH (subdural hematoma) (H[facility])     Problem List Patient Active Problem List   Diagnosis Date Noted  . Colitis 03/15/2020  . AMS (altered mental status) 10/05/2019  . Acute metabolic encephalopathy 33/00/7622  . Headache, unspecified 09/28/2019  . Abnormality of gait 09/13/2019  . Diabetes mellitus with end-stage renal disease ([place]) 08/07/2019  . Benign hypertensive kidney disease with chronic kidney disease 08/07/2019  . Hypothyroidism, unspecified 07/15/2019  . Spastic hemiparesis ([place]) 07/12/2019  . Dysphagia, oropharyngeal phase 07/05/2019  . Allergy, unspecified, initial encounter 06/27/2019  . PAF (paroxysmal atrial fibrillation) ([place])   . Bacteremia   . Labile blood pressure   .  Labile blood glucose   . Controlled type 2 diabetes mellitus with hyperglycemia, without long-term current use of insulin ([place])   . ESRD on dialysis ([place])   . Right sided weakness   . SDH (subdural hematoma) ([place])   . Chronic intracranial subdural hematoma ([facility]) 06/09/2019  . Multiple myeloma not having achieved  remission ([place]) 06/09/2019  . ESRD on hemodialysis ([place]) 06/09/2019  . Essential hypertension 06/09/2019  . DM type 2 (diabetes mellitus, type 2) ([place]) 06/09/2019  . Seizure disorder ([place]) 06/09/2019  . Aspiration pneumonia ([place]) 06/09/2019  . History of bacteremia 06/09/2019  . Atrial fibrillation, chronic ([place]) 06/09/2019  . Bilateral kidney stones 05/31/2019  . Proteus infection 05/28/2019  . History of restrictive pulmonary disease 04/07/2019  . Multiple myeloma ([place]) 05/17/2018  . Goals of care, counseling/discussion 05/17/2018  . Partial idiopathic epilepsy with seizures of localized onset, intractable, without status epilepticus ([place]) 11/02/2017  . Iron deficiency anemia, unspecified 01/22/2017  . COPD (chronic obstructive pulmonary disease) ([place]) 11/11/2016  . CVA (cerebral vascular accident) ([place]) 11/11/2016  . GERD (gastroesophageal reflux disease) 11/11/2016  . GI bleed 11/11/2016  . 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  . Obesity (BMI 30-39.9) 11/11/2016  . Pulmonary hypertension ([place]) 11/11/2016  . Patent foramen ovale 11/11/2016  . Hyperparathyroidism, secondary renal ([place]) 11/11/2016  . VTE (venous thromboembolism) 11/11/2016  . Hypotension 11/11/2016  . Systemic hypertension 11/11/2016  . Seizure ([place]) 11/11/2016  . Other seizures ([place]) 07/25/2016  . Encounter for removal of sutures 05/27/2016  . Ingrown nail 03/03/2016  . Onychomycosis due to dermatophyte 03/03/2016  . Other acquired hammer toe 03/03/2016  . Hemiparesis affecting right side as late effect of cerebrovascular accident ([place]) 02/28/2016  . Aphasia complicating stroke 25/75/0518  . Seizures ([place]) 01/08/2016  . History of ischemic left MCA stroke 01/08/2016  . Right sided weakness 01/08/2016  . Atrial fibrillation ([place]) 01/08/2016  . Leukocytosis 01/08/2016  . Benign essential HTN 01/08/2016  . Anemia of chronic disease 01/08/2016  . Controlled type 2 diabetes mellitus with diabetic  nephropathy, without long-term current use of insulin ([place]) 01/08/2016  . History of DVT (deep vein thrombosis) 01/08/2016  . 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 Hypocalcemia 12/26/2015  . Long term (current) use of anticoagulants 11/07/2015  . Aftercare including intermittent dialysis ([place]) 11/02/2015  . Other specified coagulation defects ([place]) 11/02/2015  . Complication of vascular dialysis catheter 11/02/2015  . Diarrhea, unspecified 11/02/2015  . Fever, unspecified 11/02/2015  . Pruritus, unspecified 11/02/2015  . Shortness of breath 11/02/2015  . ESRD (end stage renal disease) on dialysis [facility])     Mare Ludtke April Ma L Rubbie Goostree PT, DPT 06/12/2020, 4:01 PM  [facility] [address] [place], [place], [address] Phone: [phone]   Fax:  [phone]  Name: Rodgerick Gilliand [name] MRN: [id number] Date of Birth: 22-Apr-1966

## 2020-06-13 DIAGNOSIS — G40909 Epilepsy, unspecified, not intractable, without status epilepticus: Secondary | ICD-10-CM | POA: Diagnosis not present

## 2020-06-13 DIAGNOSIS — E1129 Type 2 diabetes mellitus with other diabetic kidney complication: Secondary | ICD-10-CM | POA: Diagnosis not present

## 2020-06-13 DIAGNOSIS — R2689 Other abnormalities of gait and mobility: Secondary | ICD-10-CM | POA: Diagnosis not present

## 2020-06-13 DIAGNOSIS — Z992 Dependence on renal dialysis: Secondary | ICD-10-CM | POA: Diagnosis not present

## 2020-06-13 DIAGNOSIS — D631 Anemia in chronic kidney disease: Secondary | ICD-10-CM | POA: Diagnosis not present

## 2020-06-13 DIAGNOSIS — F445 Conversion disorder with seizures or convulsions: Secondary | ICD-10-CM | POA: Diagnosis not present

## 2020-06-13 DIAGNOSIS — G8110 Spastic hemiplegia affecting unspecified side: Secondary | ICD-10-CM | POA: Diagnosis not present

## 2020-06-13 DIAGNOSIS — N2581 Secondary hyperparathyroidism of renal origin: Secondary | ICD-10-CM | POA: Diagnosis not present

## 2020-06-13 DIAGNOSIS — N186 End stage renal disease: Secondary | ICD-10-CM | POA: Diagnosis not present

## 2020-06-13 DIAGNOSIS — R269 Unspecified abnormalities of gait and mobility: Secondary | ICD-10-CM | POA: Diagnosis not present

## 2020-06-14 ENCOUNTER — Encounter: Payer: Self-pay | Admitting: Occupational Therapy

## 2020-06-14 ENCOUNTER — Ambulatory Visit: Payer: Medicare Other | Admitting: Occupational Therapy

## 2020-06-14 ENCOUNTER — Other Ambulatory Visit: Payer: Self-pay

## 2020-06-14 DIAGNOSIS — R2681 Unsteadiness on feet: Secondary | ICD-10-CM

## 2020-06-14 DIAGNOSIS — R41844 Frontal lobe and executive function deficit: Secondary | ICD-10-CM

## 2020-06-14 DIAGNOSIS — M6281 Muscle weakness (generalized): Secondary | ICD-10-CM

## 2020-06-14 DIAGNOSIS — R2689 Other abnormalities of gait and mobility: Secondary | ICD-10-CM

## 2020-06-14 DIAGNOSIS — R41842 Visuospatial deficit: Secondary | ICD-10-CM | POA: Diagnosis not present

## 2020-06-14 DIAGNOSIS — R41840 Attention and concentration deficit: Secondary | ICD-10-CM | POA: Diagnosis not present

## 2020-06-14 DIAGNOSIS — R278 Other lack of coordination: Secondary | ICD-10-CM

## 2020-06-14 DIAGNOSIS — S065X9A Traumatic subdural hemorrhage with loss of consciousness of unspecified duration, initial encounter: Secondary | ICD-10-CM | POA: Diagnosis not present

## 2020-06-14 DIAGNOSIS — M25631 Stiffness of right wrist, not elsewhere classified: Secondary | ICD-10-CM | POA: Diagnosis not present

## 2020-06-14 NOTE — Therapy (Signed)
[name] [address] [place], [place], [address] Phone: [phone]   Fax:  [phone]  Occupational Therapy Evaluation  Patient Details  Name: Isaiah [name] [facility] MRN: [id number] Date of Birth: 08/09/1966 Referring Provider (OT): Posey Pronto, Ankit A.   Encounter Date: 06/14/2020   OT End of Session - 06/14/20 1317    Visit Number 1    Number of Visits 17    Date for OT Re-Evaluation 08/09/20    Authorization Type Medicare    Authorization Time Period OOP has been met. No financial responsibility.    Authorization - Visit Number 1    Authorization - Number of Visits 10    Progress Note Due on Visit 10    OT Start Time 7619    OT Stop Time 1317    OT Time Calculation (min) 42 min    Activity Tolerance Patient tolerated treatment well    Behavior During Therapy WFL for tasks assessed/performed           Past Medical History:  Diagnosis Date  . A-fib ([place])   . Anemia   . Asthma   . DM type 2 (diabetes mellitus, type 2) ([place]) 06/09/2019  . ESRD (end stage renal disease) on dialysis ([place])   . Essential hypertension 06/09/2019  . GIB (gastrointestinal bleeding)    Recurrent episodes- 09/2014, 09/2015 and 05/2016  . Gout   . History of recent blood transfusion 10/27/14   2 Units PRBC's  . Hyperkalemia 07/2011  . Multiple myeloma ([place])   . OSA on CPAP   . Pulmonary embolism ([place]) 07/2011; 09/27/2014   a. Bilat PE 07/2011 - unclear cause, tx with 6 months Coumadin.;   . Seizure disorder ([place]) 06/09/2019  . Sepsis ([place])   . Sickle cell-thalassemia disease ([place])    a. Sickle cell trait.  . Sleep apnea   . Stroke ([place]) 09/2015   R-MCA, L-MCA, PCA and bilateral cerebellar complicated by DVT/PE  . Subdural hematoma ([place]) 05/2019    Past Surgical History:  Procedure Laterality Date  . AV FISTULA PLACEMENT Right 12/05/2015   Procedure: INSERTION OF ARTERIOVENOUS (AV) GORE-TEX GRAFT ARM;  Surgeon: Elam Dutch, MD;   Location: [facility];  Service: Vascular;  Laterality: Right;  . BONE MARROW BIOPSY    . CATARACT EXTRACTION  08/2019  . CATARACT EXTRACTION  09/2019  . CHOLECYSTECTOMY    . CHOLECYSTECTOMY  1990's?  . COLONOSCOPY WITH PROPOFOL N/A 01/22/2017   Procedure: COLONOSCOPY WITH PROPOFOL;  Surgeon: Wilford Corner, MD;  Location: [facility];  Service: Endoscopy;  Laterality: N/A;  . EYE SURGERY Right   . INSERTION OF DIALYSIS CATHETER Right 10/16/2015   Procedure: INSERTION OF PALINDROME DIALYSIS CATHETER ;  Surgeon: Elam Dutch, MD;  Location: [facility];  Service: Vascular;  Laterality: Right;  . OTHER SURGICAL HISTORY     Retinal surgery  . PARS PLANA VITRECTOMY  02/17/2012   Procedure: PARS PLANA VITRECTOMY WITH 25 GAUGE;  Surgeon: Hayden Pedro, MD;  Location: [facility];  Service: Ophthalmology;  Laterality: Right;  . PERIPHERAL VASCULAR CATHETERIZATION N/A 03/20/2016   Procedure: A/V Shuntogram/Fistulagram;  Surgeon: Conrad Turner, MD;  Location: [facility];  Service: Cardiovascular;  Laterality: N/A;  . TEE WITHOUT CARDIOVERSION N/A 12/02/2016   Procedure: TRANSESOPHAGEAL ECHOCARDIOGRAM (TEE);  Surgeon: Larey Dresser, MD;  Location: [facility];  Service: Cardiovascular;  Laterality: N/A;    There were no vitals filed for this visit.   Subjective Assessment -  06/14/20 1237    Subjective  "I can't write with this hand anymore" . Pt denies pain. Pt is 54 year old male w history of DM, A fib, ESRD, CVA, DVT/PE s/p IVC filter, seizure disorder, MM with recent admission to [facility] for stem cell transplant but procedure aborted due to findings of proteus mirabilis bacteremia, aspiration PNA, acute or chronic SDH, findings of bilateral renal calculi as well as breakthrough seizures..    Patient is accompanied by: Family member   spouse   Pertinent History multiple myeloma (currently undergoing chemo treatment), DM, CVA, DVT, seizure disorder, SDH, ESRD on dialysis, and history of SDH x 2     Limitations Fall. No Driving. RUE fistula-dialysis; hx of seizures    Patient Stated Goals write better with his right hand, pick things up better with R hand    Currently in Pain? No/denies             [facility] OT Assessment - 06/14/20 1238      Assessment   Medical Diagnosis spastic hemiparesis, SDH    Referring Provider (OT) Posey Pronto, Ankit A.    Onset Date/Surgical Date 12/29/19   ended HHPT, 03/08/2020 MD visit   Hand Dominance Right    Prior Therapy prior therapy at this facility 2017      Precautions   Precautions Fall    Precaution Comments RUE fistula-dialysis; hx of seizures      Balance Screen   Has the patient fallen in the past 6 months --   refer to PT evaluation.     Home  Environment   Family/patient expects to be discharged to: Private residence    Living Arrangements Spouse/significant other    Available Help at Discharge Family    Type of [place] - 2 wheels;Shower seat;Toilet riser;Grab bars - toilet;Grab bars - tub/shower;Transport chair    Lives With Spouse   son comes home on weekends     Prior Function   Level of Independence Independent      ADL   Eating/Feeding Minimal assistance    Grooming Supervision/safety    Upper Body Bathing Minimal assistance    Lower Body Bathing Minimal assistance    Upper Body Dressing Moderate assistance   can't see the tags so wife helps   Lower Body Dressing Minimal assistance    Toilet Transfer Moderate assistance    Toileting - Clothing Manipulation Moderate assistance    Toileting -  Hygiene Supervision/safety    Tub/Shower Transfer Minimal assistance    ADL comments shower chair, RW 2 wheels, grab bars, toilet frame      IADL   Prior Level of Function Shopping Independent prior 2017    Shopping Completely unable to shop    [employer] Does not participate in any housekeeping tasks    Meal Prep Needs to have  meals prepared and served    [employer] on family or friends for transportation    Medication Management Is not capable of dispensing or managing own medication    Prior Level of Function Financial Management Independent prior 2017    Financial Management Requires assistance;Dependent      Written Expression   Dominant Hand Right    Handwriting Not legible   not functional or appropriate goal at this time     Vision - History   Baseline Vision Wears glasses all the time  Visual History Cataracts    Additional Comments damage to R eye and Laser on R eye - wears bifocals for watching TV and distance      Vision Assessment   Eye Alignment Impaired (comment)    Ocular Range of Motion Restricted looking down    Tracking/Visual Pursuits Impaired - to be further tested in functional context    Saccades Impaired - to be further tested in functional context    Convergence Impaired - to be further tested in functional context    Patient has diffculty with activities due to visual impairment Adjusting stove/washing machine dials;Chopping vegetables;Measuring ingredients;Writing checks;Balancing checkbook;Reading bills    Comment Pt reports visual difficulties. Eyes not working completely together. Further assess secondary to cognitive deficits.       Cognition   Overall Cognitive Status Impaired/Different from baseline    Memory Impaired    Awareness Impaired    Problem Solving Impaired    Executive Function Reasoning;Sequencing;Organizing;Decision Making;Initiating;Self Monitoring;Self Correcting    Cognition Comments delayed processing and problem solving. difficulty following mulitple step commands      Observation/Other Assessments   Focus on Therapeutic Outcomes (FOTO)  NA      Sensation   Light Touch Not tested    Hot/Cold Not tested    Proprioception Not tested      Coordination   Box and Blocks LUE 30 RUE 9       ROM / Strength   AROM / PROM / Strength  AROM;Strength      AROM   Overall AROM  Deficits    AROM Assessment Site Shoulder;Elbow;Forearm;Wrist    Right/Left Shoulder Right    Right Shoulder Flexion 105 Degrees    Right Shoulder ABduction 92 Degrees    Right/Left Elbow Right    Right Elbow Extension -15    Right/Left Forearm Right    Right Forearm Pronation 30 Degrees    Right Forearm Supination 20 Degrees    Right/Left Wrist Right    Right Wrist Extension 10 Degrees    Right Wrist Flexion 50 Degrees    Right Wrist Radial Deviation 5 Degrees    Right Wrist Ulnar Deviation 30 Degrees                                               Left Hand PROM   L Index DIP 0-70 --   30 degree DIP contracture     Hand Function   Right Hand Grip (lbs) 22.4    Left Hand Grip (lbs) 39.6    Comment pts spouse reports pt not consistently utilizing RUE (maybe 10% of the time)                           OT Education - 06/14/20 1612    Education Details Pt and spouse educated on role and purpose of OT and plan of care.    Person(s) Educated Patient;Spouse    Methods Explanation    Comprehension Verbalized understanding            OT Short Term Goals - 06/14/20 1803      OT SHORT TERM GOAL #1   Title Patient and caregiver will be independent with HEP 07/12/20    Time 4    Period Weeks    Status New    Target Date 07/12/20  OT SHORT TERM GOAL #2   Title Patient will perform UB dressing with min A in order to increase independence with ADLs    Time 4    Period Weeks    Status New      OT SHORT TERM GOAL #3   Title Patient will demonstrate improved function use of RUE as evidenced by increasing Box and Blocks score to 12 blocks or more.    Baseline RUE 9    Time 4    Period Weeks    Status New      OT SHORT TERM GOAL #4   Title Pt will improve RUE grip strength by at least 3 lbs or more in order to manage clothing with more independence with toileting    Time 4    Period Weeks    Status New        OT SHORT TERM GOAL #5   Title Pt will perform all aspects of toileting with min A consistently    Time 4    Period Weeks    Status New      OT SHORT TERM GOAL #6   Title Pt and spouse will report patient utilizing RUE for functional tasks 15% of the time in daily activities.    Time 4    Period Weeks    Status New             OT Long Term Goals - 06/14/20 1808      OT LONG TERM GOAL #1   Title Pt will be independent with updated HEP 08/09/2020    Time 8    Period Weeks    Status New    Target Date 08/09/20      OT LONG TERM GOAL #2   Title Pt will improve RUE shoulder flexion to 110 degrees in order to obtain lightweight object from overhead cabinet.    Time 8    Period Weeks    Status New      OT LONG TERM GOAL #3   Title Patient will demonstrate improved function use of RUE as evidenced by increasing Box and Blocks score to 17 blocks or more.    Time 8    Period Weeks    Status New      OT LONG TERM GOAL #4   Title Patient will perform UB dressing with supervision with verbal cues in order to increase indepedence with ADLs.    Time 8    Period Weeks    Status New      OT LONG TERM GOAL #5   Title Pt will improve RUE grip strength by at least 6 lbs in order to manage clothing with more independence with toileting    Time 8    Period Weeks    Status New      OT LONG TERM GOAL #6   Title Pt will perform all aspects of toileting with distant supervision for safety in order to increase independence with ADLs.    Time 8    Period Weeks    Status New      OT LONG TERM GOAL #7   Title Pt and spouse will report patient utilizing RUE for functional tasks 25% of the time in daily activities.    Time 8    Period Weeks    Status New                 Plan - 06/14/20 1317    Clinical Impression Statement  Pt is a 54 year old male who returns to outpatient occupational therapy at neuro rehab with history of multiple myeloma (currently undergoing chemo  treatment), DM, CVA, DVT, seizure disorder, SDH, ESRD on dialysis, and history of SDH x 2.  Pt was preparing to undergo stem cell/bone marrow transplant at [facility] (late 2020), when he had a second SDH, increased R sided weakness.  He was independent prior to this incident.  He presents today with decreased balance, decreased strength, rnage of motion, cognitive deficits impeding his ability to complete ADLs and IADLs with maximal independence.  He has had 2 falls in past 6 months.  He will benefit from skilled occupational therapy to address the above stated deficits to increase overall independence with ADLs and IADLs and decrease caregiver burden.    OT Occupational Profile and History Detailed Assessment- Review of Records and additional review of physical, cognitive, psychosocial history related to current functional performance    Occupational performance deficits (Please refer to evaluation for details): IADL's;ADL's;Leisure;Social Participation    Body Structure / Function / Physical Skills ADL;Decreased knowledge of use of DME;Gait;Strength;Tone;GMC;Dexterity;Balance;Body mechanics;Proprioception;UE functional use;ROM;IADL;Coordination;FMC;Vision;Flexibility;Mobility    Cognitive Skills Attention;Problem Solve;Safety Awareness;Sequencing;Energy/Drive;Thought;Learn;Memory;Understand;Perception    Rehab Potential Good    Clinical Decision Making Limited treatment options, no task modification necessary    Comorbidities Affecting Occupational Performance: May have comorbidities impacting occupational performance    Modification or Assistance to Complete Evaluation  No modification of tasks or assist necessary to complete eval    OT Frequency 2x / week    OT Duration 8 weeks    OT Treatment/Interventions Self-care/ADL training;Moist Heat;Fluidtherapy;DME and/or AE instruction;Balance training;Gait Training;Therapeutic activities;Cognitive remediation/compensation;Therapeutic  exercise;Ultrasound;Neuromuscular education;Passive range of motion;Visual/perceptual remediation/compensation;Functional Mobility Training;Electrical Stimulation;Energy conservation;Manual Therapy;Patient/family education    Plan need to schedule with OT/PT at 10/14 session, wrist ROM RUE    Recommended Other Services Recommended ST referral - spouse asked to wait.    Consulted and Agree with Plan of Care Patient;Family member/caregiver    Family Member Consulted spouse           Patient will benefit from skilled therapeutic intervention in order to improve the following deficits and impairments:   Body Structure / Function / Physical Skills: ADL, Decreased knowledge of use of DME, Gait, Strength, Tone, GMC, Dexterity, Balance, Body mechanics, Proprioception, UE functional use, ROM, IADL, Coordination, FMC, Vision, Flexibility, Mobility Cognitive Skills: Attention, Problem Solve, Safety Awareness, Sequencing, Energy/Drive, Thought, Learn, Memory, Understand, Perception     Visit Diagnosis: Muscle weakness (generalized) - Plan: Ot plan of care cert/re-cert  Other abnormalities of gait and mobility - Plan: Ot plan of care cert/re-cert  Other lack of coordination - Plan: Ot plan of care cert/re-cert  Unsteadiness on feet - Plan: Ot plan of care cert/re-cert  Visuospatial deficit - Plan: Ot plan of care cert/re-cert  Frontal lobe and executive function deficit - Plan: Ot plan of care cert/re-cert  Attention and concentration deficit - Plan: Ot plan of care cert/re-cert  Stiffness of right wrist, not elsewhere classified - Plan: Ot plan of care cert/re-cert    Problem List Patient Active Problem List   Diagnosis Date Noted  . Colitis 03/15/2020  . AMS (altered mental status) 10/05/2019  . Acute metabolic encephalopathy 32/35/5732  . Headache, unspecified 09/28/2019  . Abnormality of gait 09/13/2019  . Diabetes mellitus with end-stage renal disease ([place]) 08/07/2019  . Benign  hypertensive kidney disease with chronic kidney disease 08/07/2019  . Hypothyroidism, unspecified 07/15/2019  . Spastic hemiparesis ([place]) 07/12/2019  .  Dysphagia, oropharyngeal phase 07/05/2019  . Allergy, unspecified, initial encounter 06/27/2019  . PAF (paroxysmal atrial fibrillation) ([place])   . Bacteremia   . Labile blood pressure   . Labile blood glucose   . Controlled type 2 diabetes mellitus with hyperglycemia, without long-term current use of insulin ([place])   . ESRD on dialysis ([place])   . Right sided weakness   . SDH (subdural hematoma) ([place])   . Chronic intracranial subdural hematoma ([facility]) 06/09/2019  . Multiple myeloma not having achieved remission ([place]) 06/09/2019  . ESRD on hemodialysis ([place]) 06/09/2019  . Essential hypertension 06/09/2019  . DM type 2 (diabetes mellitus, type 2) ([place]) 06/09/2019  . Seizure disorder ([place]) 06/09/2019  . Aspiration pneumonia ([place]) 06/09/2019  . History of bacteremia 06/09/2019  . Atrial fibrillation, chronic ([place]) 06/09/2019  . Bilateral kidney stones 05/31/2019  . Proteus infection 05/28/2019  . History of restrictive pulmonary disease 04/07/2019  . Multiple myeloma ([place]) 05/17/2018  . Goals of care, counseling/discussion 05/17/2018  . Partial idiopathic epilepsy with seizures of localized onset, intractable, without status epilepticus ([place]) 11/02/2017  . Iron deficiency anemia, unspecified 01/22/2017  . COPD (chronic obstructive pulmonary disease) ([place]) 11/11/2016  . CVA (cerebral vascular accident) ([place]) 11/11/2016  . GERD (gastroesophageal reflux disease) 11/11/2016  . GI bleed 11/11/2016  . 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  . Obesity (BMI 30-39.9) 11/11/2016  . Pulmonary hypertension ([place]) 11/11/2016  . Patent foramen ovale 11/11/2016  . Hyperparathyroidism, secondary renal ([place]) 11/11/2016  . VTE (venous thromboembolism) 11/11/2016  . Hypotension 11/11/2016  . Systemic hypertension 11/11/2016  . Seizure ([place])  11/11/2016  . Other seizures ([place]) 07/25/2016  . Encounter for removal of sutures 05/27/2016  . Ingrown nail 03/03/2016  . Onychomycosis due to dermatophyte 03/03/2016  . Other acquired hammer toe 03/03/2016  . Hemiparesis affecting right side as late effect of cerebrovascular accident ([place]) 02/28/2016  . Aphasia complicating stroke 96/75/9163  . Seizures ([place]) 01/08/2016  . History of ischemic left MCA stroke 01/08/2016  . Right sided weakness 01/08/2016  . Atrial fibrillation ([place]) 01/08/2016  . Leukocytosis 01/08/2016  . Benign essential HTN 01/08/2016  . Anemia of chronic disease 01/08/2016  . Controlled type 2 diabetes mellitus with diabetic nephropathy, without long-term current use of insulin ([place]) 01/08/2016  . History of DVT (deep vein thrombosis) 01/08/2016  . 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 Hypocalcemia 12/26/2015  . Long term (current) use of anticoagulants 11/07/2015  . Aftercare including intermittent dialysis ([place]) 11/02/2015  . Other specified coagulation defects ([place]) 11/02/2015  . Complication of vascular dialysis catheter 11/02/2015  . Diarrhea, unspecified 11/02/2015  . Fever, unspecified 11/02/2015  . Pruritus, unspecified 11/02/2015  . Shortness of breath 11/02/2015  . ESRD (end stage renal disease) on dialysis [name])     Zachery Conch MOT, OTR/L  06/14/2020, 6:28 PM  [facility] [address] [place], [place], [address] Phone: [phone]   Fax:  [phone]  Name: Luisdaniel Kenton [name] MRN: [id number] Date of Birth: December 04, 1965

## 2020-06-15 DIAGNOSIS — D631 Anemia in chronic kidney disease: Secondary | ICD-10-CM | POA: Diagnosis not present

## 2020-06-15 DIAGNOSIS — E1129 Type 2 diabetes mellitus with other diabetic kidney complication: Secondary | ICD-10-CM | POA: Diagnosis not present

## 2020-06-15 DIAGNOSIS — N2581 Secondary hyperparathyroidism of renal origin: Secondary | ICD-10-CM | POA: Diagnosis not present

## 2020-06-15 DIAGNOSIS — N186 End stage renal disease: Secondary | ICD-10-CM | POA: Diagnosis not present

## 2020-06-15 DIAGNOSIS — Z992 Dependence on renal dialysis: Secondary | ICD-10-CM | POA: Diagnosis not present

## 2020-06-18 DIAGNOSIS — E1129 Type 2 diabetes mellitus with other diabetic kidney complication: Secondary | ICD-10-CM | POA: Diagnosis not present

## 2020-06-18 DIAGNOSIS — Z992 Dependence on renal dialysis: Secondary | ICD-10-CM | POA: Diagnosis not present

## 2020-06-18 DIAGNOSIS — N186 End stage renal disease: Secondary | ICD-10-CM | POA: Diagnosis not present

## 2020-06-18 DIAGNOSIS — N2581 Secondary hyperparathyroidism of renal origin: Secondary | ICD-10-CM | POA: Diagnosis not present

## 2020-06-18 DIAGNOSIS — D631 Anemia in chronic kidney disease: Secondary | ICD-10-CM | POA: Diagnosis not present

## 2020-06-19 ENCOUNTER — Other Ambulatory Visit: Payer: Self-pay

## 2020-06-19 ENCOUNTER — Inpatient Hospital Stay: Payer: Medicare Other

## 2020-06-19 ENCOUNTER — Inpatient Hospital Stay: Payer: Medicare Other | Attending: Oncology

## 2020-06-19 ENCOUNTER — Inpatient Hospital Stay (HOSPITAL_BASED_OUTPATIENT_CLINIC_OR_DEPARTMENT_OTHER): Payer: Medicare Other | Admitting: Oncology

## 2020-06-19 VITALS — BP 145/42 | HR 67 | Temp 97.8°F | Resp 20 | Ht 70.0 in | Wt 188.5 lb

## 2020-06-19 VITALS — BP 155/63 | HR 68 | Temp 97.5°F | Resp 20

## 2020-06-19 DIAGNOSIS — C9000 Multiple myeloma not having achieved remission: Secondary | ICD-10-CM

## 2020-06-19 DIAGNOSIS — Z5112 Encounter for antineoplastic immunotherapy: Secondary | ICD-10-CM | POA: Diagnosis not present

## 2020-06-19 LAB — CMP (CANCER CENTER ONLY)
ALT: 17 U/L (ref 0–44)
AST: 16 U/L (ref 15–41)
Albumin: 4.3 g/dL (ref 3.5–5.0)
Alkaline Phosphatase: 108 U/L (ref 38–126)
Anion gap: 7 (ref 5–15)
BUN: 28 mg/dL — ABNORMAL HIGH (ref 6–20)
CO2: 37 mmol/L — ABNORMAL HIGH (ref 22–32)
Calcium: 9.6 mg/dL (ref 8.9–10.3)
Chloride: 98 mmol/L (ref 98–111)
Creatinine: 7.76 mg/dL (ref 0.61–1.24)
GFR, Estimated: 7 mL/min — ABNORMAL LOW (ref >60)
Glucose, Bld: 98 mg/dL (ref 70–99)
Potassium: 3.9 mmol/L (ref 3.5–5.1)
Sodium: 142 mmol/L (ref 135–145)
Total Bilirubin: 1 mg/dL (ref 0.3–1.2)
Total Protein: 7 g/dL (ref 6.5–8.1)

## 2020-06-19 LAB — CBC WITH DIFFERENTIAL (CANCER CENTER ONLY)
Abs Immature Granulocytes: 0.01 10*3/uL (ref 0.00–0.07)
Basophils Absolute: 0.1 10*3/uL (ref 0.0–0.1)
Basophils Relative: 1 %
Eosinophils Absolute: 0.6 10*3/uL — ABNORMAL HIGH (ref 0.0–0.5)
Eosinophils Relative: 10 %
HCT: 29 % — ABNORMAL LOW (ref 39.0–52.0)
Hemoglobin: 10.8 g/dL — ABNORMAL LOW (ref 13.0–17.0)
Immature Granulocytes: 0 %
Lymphocytes Relative: 12 %
Lymphs Abs: 0.8 10*3/uL (ref 0.7–4.0)
MCH: 34.2 pg — ABNORMAL HIGH (ref 26.0–34.0)
MCHC: 37.2 g/dL — ABNORMAL HIGH (ref 30.0–36.0)
MCV: 91.8 fL (ref 80.0–100.0)
Monocytes Absolute: 0.9 10*3/uL (ref 0.1–1.0)
Monocytes Relative: 13 %
Neutro Abs: 4.1 10*3/uL (ref 1.7–7.7)
Neutrophils Relative %: 64 %
Platelet Count: 260 10*3/uL (ref 150–400)
RBC: 3.16 MIL/uL — ABNORMAL LOW (ref 4.22–5.81)
RDW: 13.6 % (ref 11.5–15.5)
WBC Count: 6.5 10*3/uL (ref 4.0–10.5)
nRBC: 1.5 % — ABNORMAL HIGH (ref 0.0–0.2)

## 2020-06-19 MED ORDER — ACETAMINOPHEN 325 MG PO TABS
650.0000 mg | ORAL_TABLET | Freq: Once | ORAL | Status: AC
  Administered 2020-06-19: 650 mg via ORAL

## 2020-06-19 MED ORDER — DIPHENHYDRAMINE HCL 25 MG PO CAPS
50.0000 mg | ORAL_CAPSULE | Freq: Once | ORAL | Status: AC
  Administered 2020-06-19: 50 mg via ORAL

## 2020-06-19 MED ORDER — ACETAMINOPHEN 325 MG PO TABS
ORAL_TABLET | ORAL | Status: AC
  Filled 2020-06-19: qty 2

## 2020-06-19 MED ORDER — DEXAMETHASONE 4 MG PO TABS: 20.0000 mg | ORAL_TABLET | Freq: Once | ORAL | Status: DC

## 2020-06-19 MED ORDER — DEXAMETHASONE 4 MG PO TABS
ORAL_TABLET | ORAL | Status: AC
  Filled 2020-06-19: qty 5

## 2020-06-19 MED ORDER — DIPHENHYDRAMINE HCL 25 MG PO CAPS
ORAL_CAPSULE | ORAL | Status: AC
  Filled 2020-06-19: qty 2

## 2020-06-19 MED ORDER — DARATUMUMAB-HYALURONIDASE-FIHJ 1800-30000 MG-UT/15ML ~~LOC~~ SOLN
1800.0000 mg | Freq: Once | SUBCUTANEOUS | Status: AC
  Administered 2020-06-19: 1800 mg via SUBCUTANEOUS
  Filled 2020-06-19: qty 15

## 2020-06-19 NOTE — Progress Notes (Signed)
Hematology and Oncology Follow Up Visit  [place] [id number] 1966/05/18 54 y.o. 06/19/2020 1:06 PM    Principle Diagnosis: 54 year old man with IgG kappa multiple myeloma arising from MGUS diagnosed in 2019.   Past therapy: Velcade dexamethasone and cyclophosphamide weekly started on 05/25/2018.  Velcade is given at 1.5 mg/m weekly, dexamethasone is 20 mg weekly and and Cytoxan is given at 650 mg total dose.  Last treatment was given in August 2020.    Therapy was held today in anticipation of high-dose chemotherapy and autologous stem cell transplant.  He did not receive stem cell transplant due to subdural hematoma and sepsis in September 2020.  Velcade at 1.5 mg per metered squared subcutaneously weekly, dexamethasone 20 mg weekly, daratumumab 1800 mg subcutaneous injection.   Cycle 1 started on 08/30/2019.  Therapy concluded in June 2021.   Current therapy:  Daratumumab monthly maintenance started in June 2021.  He is here for the next cycle of therapy.  Interim History:  [name] is here for a follow-up evaluation.  Since last visit, he reports no major changes in his health.  He continues to tolerate hemodialysis without any recent complications.  He denies any hospitalizations or illnesses.  He denies any complications related to daratumumab.  As performance status quality of life is improving with physical therapy he is able to ambulate short distances with the help of a walker.                      Medications: Updated on review.    Current Outpatient Medications:  .  acetaminophen (TYLENOL) 325 MG tablet, Take 1-2 tablets (325-650 mg total) by mouth every 4 (four) hours as needed for mild pain., Disp:  , Rfl:  .  acyclovir (ZOVIRAX) 400 MG tablet, TAKE 1 TABLET BY MOUTH ONCE DAILY, Disp: 90 tablet, Rfl: 0 .  albuterol (PROAIR HFA) 108 (90 Base) MCG/ACT inhaler, Inhale two puffs every 4-6 hours if needed for cough or wheeze (Patient taking differently:  Inhale 2 puffs into the lungs every 4 (four) hours as needed for wheezing or shortness of breath. Inhale two puffs every 4-6 hours if needed for cough or wheeze), Disp: 6.7 g, Rfl: 2 .  Alcohol Swabs (SM ALCOHOL PREP) 70 % PADS, , Disp: , Rfl:  .  atorvastatin (LIPITOR) 20 MG tablet, TAKE 1 TABLET BY MOUTH ONCE A DAY, Disp: 30 tablet, Rfl: 0 .  AURYXIA 1 GM 210 MG(Fe) tablet, Take 210 mg by mouth 3 (three) times daily., Disp: , Rfl:  .  BREO ELLIPTA 200-25 MCG/INH AEPB, INHALE 1 PUFF INTO THE LUNGS AT THE SAME TIME EVERY DAY, Disp: 60 each, Rfl: 2 .  brimonidine (ALPHAGAN) 0.2 % ophthalmic solution, Place 1 drop into the right eye 2 (two) times daily.  (Patient not taking: Reported on 06/14/2020), Disp: , Rfl:  .  calcium acetate (PHOSLO) 667 MG capsule, Take by mouth., Disp: , Rfl:  .  camphor-menthol (SARNA) lotion, Apply 1 application topically every 8 (eight) hours as needed for itching., Disp: 222 mL, Rfl: 0 .  Cetirizine HCl 10 MG CAPS, Take 1 capsule by mouth daily. , Disp: , Rfl:  .  Cinacalcet HCl (SENSIPAR PO), Take by mouth. (Patient not taking: Reported on 06/14/2020), Disp: , Rfl:  .  clonazePAM (KLONOPIN) 0.5 MG tablet, Take 2 tablets every Monday, Wednesday, and Friday with hemodialysis, Disp: 30 tablet, Rfl: 5 .  COSOPT 22.3-6.8 MG/ML ophthalmic solution, Place 1 drop into the right  eye 2 (two) times daily. (Patient not taking: Reported on 06/14/2020), Disp: , Rfl:  .  FREESTYLE LITE test strip, , Disp: , Rfl:  .  hydrOXYzine (ATARAX/VISTARIL) 25 MG tablet, TAKE 1 TABLET (25 MG TOTAL) BY MOUTH EVERY 8 (EIGHT) HOURS AS NEEDED FOR ITCHING., Disp: 30 tablet, Rfl: 0 .  Insulin Glargine (BASAGLAR KWIKPEN) 100 UNIT/ML, Inject 10 Units into the skin daily. , Disp: , Rfl:  .  Insulin Lispro (HUMALOG KWIKPEN [place]), Inject 2-4 Units into the skin 3 (three) times daily before meals. Sliding scale: 150-250 =2 units, 250 -350=3 units, 350 += 4 units, Disp: , Rfl:  .  lacosamide (VIMPAT) 200 MG TABS  tablet, Take 1 tablet twice a day except on dialysis days M-W-F give 1 tab in AM, 1 tab after dialysis, 1 tab in PM., Disp: 90 tablet, Rfl: 5 .  lamoTRIgine (LAMICTAL) 200 MG tablet, Take 1 tablet (200 mg total) by mouth 2 (two) times daily., Disp: 60 tablet, Rfl: 11 .  Lancets (FREESTYLE) lancets, , Disp: , Rfl:  .  metoprolol tartrate (LOPRESSOR) 25 MG tablet, Take 25 mg by mouth daily., Disp: , Rfl:  .  metroNIDAZOLE (FLAGYL) 250 MG tablet, Take 1 tablet (250 mg total) by mouth every 8 (eight) hours. (Patient not taking: Reported on 06/14/2020), Disp: 9 tablet, Rfl: 0 .  midodrine (PROAMATINE) 10 MG tablet, TAKE 1 TABLET BY MOUTH TWICE DAILY WITH MEALS, Disp: 60 tablet, Rfl: 0 .  multivitamin (RENA-VIT) TABS tablet, Take 1 tablet by mouth at bedtime., Disp: , Rfl:  .  Nutritional Supplements (FEEDING SUPPLEMENT, NEPRO CARB STEADY,) LIQD, Take 237 mLs by mouth 3 (three) times daily as needed (Supplement)., Disp: 237 mL, Rfl: 12 .  pantoprazole (PROTONIX) 40 MG tablet, TAKE 1 TABLET BY MOUTH ONCE DAILY, Disp: 90 tablet, Rfl: 1 .  prochlorperazine (COMPAZINE) 10 MG tablet, TAKE 1 TABLET (10 MG TOTAL) BY MOUTH EVERY 6 (SIX) HOURS AS NEEDED FOR NAUSEA OR VOMITING., Disp: 30 tablet, Rfl: 0 .  UNIFINE PENTIPS 31G X 8 MM MISC, USE AS DIRECTED DAILY AFTER SUPPER, Disp: 100 each, Rfl: 3  Allergies:  Allergies  Allergen Reactions  . Codeine Swelling  . Codeine Rash and Other (See Comments)    Unknown reaction (patient says it was more serious than just a rash, but he can't remember what happened)       Physical Exam:        Blood pressure (!) 145/42, pulse 67, temperature 97.8 F (36.6 C), temperature source Tympanic, resp. rate 20, height _0  (1.778 m), weight 188 lb 8 oz (85.5 kg), SpO2 100 %.       ECOG: 1   General appearance: Comfortable appearing without any discomfort Head: Normocephalic without any trauma Oropharynx: Mucous membranes are moist and pink without any thrush  or ulcers. Eyes: Pupils are equal and round reactive to light. Lymph nodes: No cervical, supraclavicular, inguinal or axillary lymphadenopathy.   Heart:regular rate and rhythm.  S1 and S2 without leg edema. Lung: Clear without any rhonchi or wheezes.  No dullness to percussion. Abdomin: Soft, nontender, nondistended with good bowel sounds.  No hepatosplenomegaly. Musculoskeletal: No joint deformity or effusion.  Full range of motion noted. Neurological: No deficits noted on motor, sensory and deep tendon reflex exam. Skin: No petechial rash or dryness.  Appeared moist.                   Lab Results: Lab Results  Component Value Date  WBC 7.1 05/22/2020   HGB 12.0 (L) 05/22/2020   HCT RESULTS UNAVAILABLE DUE TO INTERFERING SUBSTANCE 05/22/2020   MCV RESULTS UNAVAILABLE DUE TO INTERFERING SUBSTANCE 05/22/2020   PLT 243 05/22/2020     Chemistry      Component Value Date/Time   NA 140 05/22/2020 0908   NA 142 02/15/2020 0000   NA 138 07/03/2017 0813   K 4.0 05/22/2020 0908   K 4.2 07/03/2017 0813   CL 96 (L) 05/22/2020 0908   CO2 33 (H) 05/22/2020 0908   CO2 27 07/03/2017 0813   BUN 32 (H) 05/22/2020 0908   BUN 41 (A) 03/22/2020 0000   BUN 46.4 (H) 07/03/2017 0813   CREATININE 7.67 (HH) 05/22/2020 0908   CREATININE 10.9 (HH) 07/03/2017 0813   GLU 157 03/22/2020 0000      Component Value Date/Time   CALCIUM 9.7 05/22/2020 0908   CALCIUM 9.3 07/03/2017 0813   ALKPHOS 133 (H) 05/22/2020 0908   ALKPHOS 77 07/03/2017 0813   AST 16 05/22/2020 0908   AST 16 07/03/2017 0813   ALT 15 05/22/2020 0908   ALT 14 07/03/2017 0813   BILITOT 0.9 05/22/2020 0908   BILITOT 0.93 07/03/2017 0813      Results for TOMI, PADDOCK (MRN [id number]) as of 06/19/2020 13:08  Ref. Range 05/22/2020 09:15  M Protein SerPl Elph-Mcnc Latest Ref Range: Not Observed g/dL Not Observed  IFE 1 Unknown Comment  Globulin, Total Latest Ref Range: 2.2 - 3.9 g/dL 2.5  B-Globulin SerPl  Elph-Mcnc Latest Ref Range: 0.7 - 1.3 g/dL 0.8  IgG (Immunoglobin G), Serum Latest Ref Range: 603 - 1,613 mg/dL 607  IgM (Immunoglobulin M), Srm Latest Ref Range: 20 - 172 mg/dL 22  IgA Latest Ref Range: 90 - 386 mg/dL 35 (L)     Impression and Plan:  54 year old man with:  1.  IgG kappa multiple myeloma arising from MGUS diagnosed in 2019.    He is currently receiving maintenance of daratumumab without any major complications.  Protein studies obtained on 05/22/2020 continues to show no M spike Detected with normalization of IgG levels.  His serum light chains continues to show slight increase kappa lambda ratio.  Risks and benefits of continuing daratumumab maintenance were reviewed at this time.  Recommended continuing it for the time being with possible discontinuation after December 2021.  He is agreeable with this plan.  2. Anemia: Hemoglobin continues to improve at this time.  His anemia is related to renal insufficiency as well as plasma cell disorder.   3.  Antiemetics: No nausea or vomiting reported time.  Compazine is available to him.  4.  Renal failure: He is hemodialysis dependent without any recent issues.  5. Goals of care: Treatment is unlikely to be curative although aggressive measures are warranted given his reasonable performance status and the likelihood of receiving long-term remission as possible.   6. Follow-up: In 4 weeks for the next cycle of therapy.   30 minutes were spent on this encounter.  The time was dedicated to reviewing his disease status, reviewing laboratory data and future plan of care review.   Zola Button, MD 10/12/20211:06 PM

## 2020-06-19 NOTE — Progress Notes (Signed)
Per RN patient did not take the 20 mg of dexamethasone prior to appointment today as he usually does, therefore 20 mg of PO dexamethasone ordered for prior to Darzalex Faspro administration. RN aware to wait one hour between dexamethasone and Darzalex administration.   Eddie Candle, PharmD PGY-2 Hematology/Oncology Pharmacy Resident

## 2020-06-19 NOTE — Progress Notes (Signed)
CRITICAL VALUE STICKER  CRITICAL VALUE: Creatinine 7.76  RECEIVER (on-site recipient of call): Meriel Flavors LPN  DATE & TIME NOTIFIED:  06/19/20 1345 MESSENGER (representative from lab): Delsa Sale MD NOTIFIED:  Dr Alen Blew TIME OF NOTIFICATION: 1350 RESPONSE:  Noted

## 2020-06-19 NOTE — Patient Instructions (Signed)
[facility] [facility] Discharge Instructions for Patients Receiving Chemotherapy  Today you received the following chemotherapy agent: Daratumumab (Darzalex FASPRO)  To help prevent nausea and vomiting after your treatment, we encourage you to take your nausea medication as directed by your MD   If you develop nausea and vomiting that is not controlled by your nausea medication, call the clinic.   BELOW ARE SYMPTOMS THAT SHOULD BE REPORTED IMMEDIATELY:  *FEVER GREATER THAN 100.5 F  *CHILLS WITH OR WITHOUT FEVER  NAUSEA AND VOMITING THAT IS NOT CONTROLLED WITH YOUR NAUSEA MEDICATION  *UNUSUAL SHORTNESS OF BREATH  *UNUSUAL BRUISING OR BLEEDING  TENDERNESS IN MOUTH AND THROAT WITH OR WITHOUT PRESENCE OF ULCERS  *URINARY PROBLEMS  *BOWEL PROBLEMS  UNUSUAL RASH Items with * indicate a potential emergency and should be followed up as soon as possible.  Feel free to call the clinic should you have any questions or concerns. The clinic phone number is [phone].  Please show the [place] [place] at check-in to the Emergency Department and triage nurse.

## 2020-06-19 NOTE — Progress Notes (Signed)
Per. Dr. Alen Blew pt. not taking Decadron po before Darzalex FASPRO. Pt. declines to stay for 30 minute post observation, states he has not had any difficulty with injection. Vital signs stable, denies chest pain, dizziness, and no shortness of breath noted.

## 2020-06-20 DIAGNOSIS — N186 End stage renal disease: Secondary | ICD-10-CM | POA: Diagnosis not present

## 2020-06-20 DIAGNOSIS — E1129 Type 2 diabetes mellitus with other diabetic kidney complication: Secondary | ICD-10-CM | POA: Diagnosis not present

## 2020-06-20 DIAGNOSIS — D631 Anemia in chronic kidney disease: Secondary | ICD-10-CM | POA: Diagnosis not present

## 2020-06-20 DIAGNOSIS — N2581 Secondary hyperparathyroidism of renal origin: Secondary | ICD-10-CM | POA: Diagnosis not present

## 2020-06-20 DIAGNOSIS — Z992 Dependence on renal dialysis: Secondary | ICD-10-CM | POA: Diagnosis not present

## 2020-06-20 LAB — KAPPA/LAMBDA LIGHT CHAINS
Kappa free light chain: 138.7 mg/L — ABNORMAL HIGH (ref 3.3–19.4)
Kappa, lambda light chain ratio: 3.02 — ABNORMAL HIGH (ref 0.26–1.65)
Lambda free light chains: 45.9 mg/L — ABNORMAL HIGH (ref 5.7–26.3)

## 2020-06-21 ENCOUNTER — Encounter: Payer: Self-pay | Admitting: Occupational Therapy

## 2020-06-21 ENCOUNTER — Other Ambulatory Visit: Payer: Self-pay

## 2020-06-21 ENCOUNTER — Ambulatory Visit: Payer: Medicare Other | Admitting: Occupational Therapy

## 2020-06-21 DIAGNOSIS — R278 Other lack of coordination: Secondary | ICD-10-CM

## 2020-06-21 DIAGNOSIS — R2689 Other abnormalities of gait and mobility: Secondary | ICD-10-CM | POA: Diagnosis not present

## 2020-06-21 DIAGNOSIS — R41840 Attention and concentration deficit: Secondary | ICD-10-CM | POA: Diagnosis not present

## 2020-06-21 DIAGNOSIS — R2681 Unsteadiness on feet: Secondary | ICD-10-CM

## 2020-06-21 DIAGNOSIS — M6281 Muscle weakness (generalized): Secondary | ICD-10-CM

## 2020-06-21 DIAGNOSIS — R41842 Visuospatial deficit: Secondary | ICD-10-CM | POA: Diagnosis not present

## 2020-06-21 DIAGNOSIS — R41844 Frontal lobe and executive function deficit: Secondary | ICD-10-CM | POA: Diagnosis not present

## 2020-06-21 DIAGNOSIS — S065X9A Traumatic subdural hemorrhage with loss of consciousness of unspecified duration, initial encounter: Secondary | ICD-10-CM | POA: Diagnosis not present

## 2020-06-21 DIAGNOSIS — M25631 Stiffness of right wrist, not elsewhere classified: Secondary | ICD-10-CM | POA: Diagnosis not present

## 2020-06-21 LAB — MULTIPLE MYELOMA PANEL, SERUM
Albumin SerPl Elph-Mcnc: 4.3 g/dL (ref 2.9–4.4)
Albumin/Glob SerPl: 1.8 — ABNORMAL HIGH (ref 0.7–1.7)
Alpha 1: 0.3 g/dL (ref 0.0–0.4)
Alpha2 Glob SerPl Elph-Mcnc: 0.8 g/dL (ref 0.4–1.0)
B-Globulin SerPl Elph-Mcnc: 0.8 g/dL (ref 0.7–1.3)
Gamma Glob SerPl Elph-Mcnc: 0.6 g/dL (ref 0.4–1.8)
Globulin, Total: 2.4 g/dL (ref 2.2–3.9)
IgA: 35 mg/dL — ABNORMAL LOW (ref 90–386)
IgG (Immunoglobin G), Serum: 574 mg/dL — ABNORMAL LOW (ref 603–1613)
IgM (Immunoglobulin M), Srm: 22 mg/dL (ref 20–172)
Total Protein ELP: 6.7 g/dL (ref 6.0–8.5)

## 2020-06-21 NOTE — Therapy (Signed)
[facility] [address] [place], [place], [address] Phone: [phone]   Fax:  [phone]  Occupational Therapy Treatment  Patient Details  Name: Isaiah [name] MRN: [id number] Date of Birth: Mar 08, 1966 Referring Provider (OT): Posey Pronto, Ankit A.   Encounter Date: 06/21/2020   OT End of Session - 06/21/20 1109    Visit Number 2    Number of Visits 17    Date for OT Re-Evaluation 08/09/20    Authorization Type Medicare    Authorization Time Period OOP has been met. No financial responsibility.    Authorization - Visit Number 2    Authorization - Number of Visits 10    Progress Note Due on Visit 10    OT Start Time 1107    OT Stop Time 1145    OT Time Calculation (min) 38 min    Activity Tolerance Patient tolerated treatment well    Behavior During Therapy WFL for tasks assessed/performed           Past Medical History:  Diagnosis Date  . A-fib ([place])   . Anemia   . Asthma   . DM type 2 (diabetes mellitus, type 2) ([place]) 06/09/2019  . ESRD (end stage renal disease) on dialysis ([place])   . Essential hypertension 06/09/2019  . GIB (gastrointestinal bleeding)    Recurrent episodes- 09/2014, 09/2015 and 05/2016  . Gout   . History of recent blood transfusion 10/27/14   2 Units PRBC's  . Hyperkalemia 07/2011  . Multiple myeloma ([place])   . OSA on CPAP   . Pulmonary embolism ([place]) 07/2011; 09/27/2014   a. Bilat PE 07/2011 - unclear cause, tx with 6 months Coumadin.;   . Seizure disorder ([place]) 06/09/2019  . Sepsis ([place])   . Sickle cell-thalassemia disease ([place])    a. Sickle cell trait.  . Sleep apnea   . Stroke ([place]) 09/2015   R-MCA, L-MCA, PCA and bilateral cerebellar complicated by DVT/PE  . Subdural hematoma ([place]) 05/2019    Past Surgical History:  Procedure Laterality Date  . AV FISTULA PLACEMENT Right 12/05/2015   Procedure: INSERTION OF ARTERIOVENOUS (AV) GORE-TEX GRAFT ARM;  Surgeon: Elam Dutch, MD;   Location: [facility];  Service: Vascular;  Laterality: Right;  . BONE MARROW BIOPSY    . CATARACT EXTRACTION  08/2019  . CATARACT EXTRACTION  09/2019  . CHOLECYSTECTOMY    . CHOLECYSTECTOMY  1990's?  . COLONOSCOPY WITH PROPOFOL N/A 01/22/2017   Procedure: COLONOSCOPY WITH PROPOFOL;  Surgeon: Wilford Corner, MD;  Location: [facility];  Service: Endoscopy;  Laterality: N/A;  . EYE SURGERY Right   . INSERTION OF DIALYSIS CATHETER Right 10/16/2015   Procedure: INSERTION OF PALINDROME DIALYSIS CATHETER ;  Surgeon: Elam Dutch, MD;  Location: [facility];  Service: Vascular;  Laterality: Right;  . OTHER SURGICAL HISTORY     Retinal surgery  . PARS PLANA VITRECTOMY  02/17/2012   Procedure: PARS PLANA VITRECTOMY WITH 25 GAUGE;  Surgeon: Hayden Pedro, MD;  Location: [facility];  Service: Ophthalmology;  Laterality: Right;  . PERIPHERAL VASCULAR CATHETERIZATION N/A 03/20/2016   Procedure: A/V Shuntogram/Fistulagram;  Surgeon: Conrad New Washington, MD;  Location: [facility];  Service: Cardiovascular;  Laterality: N/A;  . TEE WITHOUT CARDIOVERSION N/A 12/02/2016   Procedure: TRANSESOPHAGEAL ECHOCARDIOGRAM (TEE);  Surgeon: Larey Dresser, MD;  Location: [facility];  Service: Cardiovascular;  Laterality: N/A;    There were no vitals filed for this visit.   Subjective Assessment -  06/21/20 1110    Subjective  Pt denies pain and reports no falls.    Patient is accompanied by: Family member   spouse   Pertinent History multiple myeloma (currently undergoing chemo treatment), DM, CVA, DVT, seizure disorder, SDH, ESRD on dialysis, and history of SDH x 2    Limitations Fall. No Driving. RUE fistula-dialysis; hx of seizures    Patient Stated Goals write better with his right hand, pick things up better with R hand    Currently in Pain? No/denies             Treatment  Large pegs with RUE with max difficulty and assistance required for grasp/release.   Grasp/Release with cones - mod facilitation of 5th  digit flexion and 2nd digit extension secondary to spasticity in RUE   Supination/pronation wheel x 10 for AAROM RUE  Self AAROM of RUE wrist extension/flexion   Pt able to follow one step commands but has difficulty following multi step commands.                   OT Short Term Goals - 06/14/20 1803      OT SHORT TERM GOAL #1   Title Patient and caregiver will be independent with HEP 07/12/20    Time 4    Period Weeks    Status New    Target Date 07/12/20      OT SHORT TERM GOAL #2   Title Patient will perform UB dressing with min A in order to increase independence with ADLs    Time 4    Period Weeks    Status New      OT SHORT TERM GOAL #3   Title Patient will demonstrate improved function use of RUE as evidenced by increasing Box and Blocks score to 12 blocks or more.    Baseline RUE 9    Time 4    Period Weeks    Status New      OT SHORT TERM GOAL #4   Title Pt will improve RUE grip strength by at least 3 lbs or more in order to manage clothing with more independence with toileting    Time 4    Period Weeks    Status New      OT SHORT TERM GOAL #5   Title Pt will perform all aspects of toileting with min A consistently    Time 4    Period Weeks    Status New      OT SHORT TERM GOAL #6   Title Pt and spouse will report patient utilizing RUE for functional tasks 15% of the time in daily activities.    Time 4    Period Weeks    Status New             OT Long Term Goals - 06/14/20 1808      OT LONG TERM GOAL #1   Title Pt will be independent with updated HEP 08/09/2020    Time 8    Period Weeks    Status New    Target Date 08/09/20      OT LONG TERM GOAL #2   Title Pt will improve RUE shoulder flexion to 110 degrees in order to obtain lightweight object from overhead cabinet.    Time 8    Period Weeks    Status New      OT LONG TERM GOAL #3   Title Patient will demonstrate improved function use of RUE as  evidenced by increasing Box  and Blocks score to 17 blocks or more.    Time 8    Period Weeks    Status New      OT LONG TERM GOAL #4   Title Patient will perform UB dressing with supervision with verbal cues in order to increase indepedence with ADLs.    Time 8    Period Weeks    Status New      OT LONG TERM GOAL #5   Title Pt will improve RUE grip strength by at least 6 lbs in order to manage clothing with more independence with toileting    Time 8    Period Weeks    Status New      OT LONG TERM GOAL #6   Title Pt will perform all aspects of toileting with distant supervision for safety in order to increase independence with ADLs.    Time 8    Period Weeks    Status New      OT LONG TERM GOAL #7   Title Pt and spouse will report patient utilizing RUE for functional tasks 25% of the time in daily activities.    Time 8    Period Weeks    Status New                 Plan - 06/21/20 1138    Clinical Impression Statement Pt limited with cognitive skills with difficulty following 2+ step commands. Pt continuing to work towards goals.    OT Occupational Profile and History Detailed Assessment- Review of Records and additional review of physical, cognitive, psychosocial history related to current functional performance    Occupational performance deficits (Please refer to evaluation for details): IADL's;ADL's;Leisure;Social Participation    Body Structure / Function / Physical Skills ADL;Decreased knowledge of use of DME;Gait;Strength;Tone;GMC;Dexterity;Balance;Body mechanics;Proprioception;UE functional use;ROM;IADL;Coordination;FMC;Vision;Flexibility;Mobility    Cognitive Skills Attention;Problem Solve;Safety Awareness;Sequencing;Energy/Drive;Thought;Learn;Memory;Understand;Perception    Rehab Potential Good    Clinical Decision Making Limited treatment options, no task modification necessary    Comorbidities Affecting Occupational Performance: May have comorbidities impacting occupational performance      Modification or Assistance to Complete Evaluation  No modification of tasks or assist necessary to complete eval    OT Frequency 2x / week    OT Duration 8 weeks    OT Treatment/Interventions Self-care/ADL training;Moist Heat;Fluidtherapy;DME and/or AE instruction;Balance training;Gait Training;Therapeutic activities;Cognitive remediation/compensation;Therapeutic exercise;Ultrasound;Neuromuscular education;Passive range of motion;Visual/perceptual remediation/compensation;Functional Mobility Training;Electrical Stimulation;Energy conservation;Manual Therapy;Patient/family education    Plan RUE NMR, RUE coordination, spouse needs to schedule for additional weeks after october if present.    Recommended Other Services Recommended ST referral - spouse asked to wait.    Consulted and Agree with Plan of Care Patient;Family member/caregiver    Family Member Consulted spouse           Patient will benefit from skilled therapeutic intervention in order to improve the following deficits and impairments:   Body Structure / Function / Physical Skills: ADL, Decreased knowledge of use of DME, Gait, Strength, Tone, GMC, Dexterity, Balance, Body mechanics, Proprioception, UE functional use, ROM, IADL, Coordination, FMC, Vision, Flexibility, Mobility Cognitive Skills: Attention, Problem Solve, Safety Awareness, Sequencing, Energy/Drive, Thought, Learn, Memory, Understand, Perception     Visit Diagnosis: Muscle weakness (generalized)  Other abnormalities of gait and mobility  Unsteadiness on feet  Other lack of coordination  Visuospatial deficit  Frontal lobe and executive function deficit  Stiffness of right wrist, not elsewhere classified    Problem List Patient Active Problem List  Diagnosis Date Noted  . Colitis 03/15/2020  . AMS (altered mental status) 10/05/2019  . Acute metabolic encephalopathy 73/53/2992  . Headache, unspecified 09/28/2019  . Abnormality of gait 09/13/2019  .  Diabetes mellitus with end-stage renal disease ([place]) 08/07/2019  . Benign hypertensive kidney disease with chronic kidney disease 08/07/2019  . Hypothyroidism, unspecified 07/15/2019  . Spastic hemiparesis ([place]) 07/12/2019  . Dysphagia, oropharyngeal phase 07/05/2019  . Allergy, unspecified, initial encounter 06/27/2019  . PAF (paroxysmal atrial fibrillation) ([place])   . Bacteremia   . Labile blood pressure   . Labile blood glucose   . Controlled type 2 diabetes mellitus with hyperglycemia, without long-term current use of insulin ([place])   . ESRD on dialysis ([place])   . Right sided weakness   . SDH (subdural hematoma) ([place])   . Chronic intracranial subdural hematoma ([facility]) 06/09/2019  . Multiple myeloma not having achieved remission ([place]) 06/09/2019  . ESRD on hemodialysis ([place]) 06/09/2019  . Essential hypertension 06/09/2019  . DM type 2 (diabetes mellitus, type 2) ([place]) 06/09/2019  . Seizure disorder ([place]) 06/09/2019  . Aspiration pneumonia ([place]) 06/09/2019  . History of bacteremia 06/09/2019  . Atrial fibrillation, chronic ([place]) 06/09/2019  . Bilateral kidney stones 05/31/2019  . Proteus infection 05/28/2019  . History of restrictive pulmonary disease 04/07/2019  . Multiple myeloma ([place]) 05/17/2018  . Goals of care, counseling/discussion 05/17/2018  . Partial idiopathic epilepsy with seizures of localized onset, intractable, without status epilepticus ([place]) 11/02/2017  . Iron deficiency anemia, unspecified 01/22/2017  . COPD (chronic obstructive pulmonary disease) ([place]) 11/11/2016  . CVA (cerebral vascular accident) ([place]) 11/11/2016  . GERD (gastroesophageal reflux disease) 11/11/2016  . GI bleed 11/11/2016  . 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  . Obesity (BMI 30-39.9) 11/11/2016  . Pulmonary hypertension ([place]) 11/11/2016  . Patent foramen ovale 11/11/2016  . Hyperparathyroidism, secondary renal ([place]) 11/11/2016  . VTE (venous thromboembolism) 11/11/2016  .  Hypotension 11/11/2016  . Systemic hypertension 11/11/2016  . Seizure ([place]) 11/11/2016  . Other seizures ([place]) 07/25/2016  . Encounter for removal of sutures 05/27/2016  . Ingrown nail 03/03/2016  . Onychomycosis due to dermatophyte 03/03/2016  . Other acquired hammer toe 03/03/2016  . Hemiparesis affecting right side as late effect of cerebrovascular accident ([place]) 02/28/2016  . Aphasia complicating stroke 42/68/3419  . Seizures ([place]) 01/08/2016  . History of ischemic left MCA stroke 01/08/2016  . Right sided weakness 01/08/2016  . Atrial fibrillation ([place]) 01/08/2016  . Leukocytosis 01/08/2016  . Benign essential HTN 01/08/2016  . Anemia of chronic disease 01/08/2016  . Controlled type 2 diabetes mellitus with diabetic nephropathy, without long-term current use of insulin ([place]) 01/08/2016  . History of DVT (deep vein thrombosis) 01/08/2016  . 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 Hypocalcemia 12/26/2015  . Long term (current) use of anticoagulants 11/07/2015  . Aftercare including intermittent dialysis ([place]) 11/02/2015  . Other specified coagulation defects ([place]) 11/02/2015  . Complication of vascular dialysis catheter 11/02/2015  . Diarrhea, unspecified 11/02/2015  . Fever, unspecified 11/02/2015  . Pruritus, unspecified 11/02/2015  . Shortness of breath 11/02/2015  . ESRD (end stage renal disease) on dialysis [name])     Zachery Conch MOT, OTR/L  06/21/2020, 12:03 PM  [facility] [address] [place], [place], [address] Phone: [phone]   Fax:  [phone]  Name: Isaiah [name] MRN: [id number] Date of Birth: Jun 25, 1966

## 2020-06-22 DIAGNOSIS — N2581 Secondary hyperparathyroidism of renal origin: Secondary | ICD-10-CM | POA: Diagnosis not present

## 2020-06-22 DIAGNOSIS — N186 End stage renal disease: Secondary | ICD-10-CM | POA: Diagnosis not present

## 2020-06-22 DIAGNOSIS — E1129 Type 2 diabetes mellitus with other diabetic kidney complication: Secondary | ICD-10-CM | POA: Diagnosis not present

## 2020-06-22 DIAGNOSIS — D631 Anemia in chronic kidney disease: Secondary | ICD-10-CM | POA: Diagnosis not present

## 2020-06-22 DIAGNOSIS — Z992 Dependence on renal dialysis: Secondary | ICD-10-CM | POA: Diagnosis not present

## 2020-06-24 MED FILL — HUMALOG 100 UNITS/ML KWIKPE: 100 | 75 days supply | Qty: 9 | Fill #1

## 2020-06-24 MED FILL — BASAGLAR 100 UNIT/ML KWIKPE: 100 | 90 days supply | Qty: 9 | Fill #1

## 2020-06-25 DIAGNOSIS — N2581 Secondary hyperparathyroidism of renal origin: Secondary | ICD-10-CM | POA: Diagnosis not present

## 2020-06-25 DIAGNOSIS — E1129 Type 2 diabetes mellitus with other diabetic kidney complication: Secondary | ICD-10-CM | POA: Diagnosis not present

## 2020-06-25 DIAGNOSIS — D631 Anemia in chronic kidney disease: Secondary | ICD-10-CM | POA: Diagnosis not present

## 2020-06-25 DIAGNOSIS — N186 End stage renal disease: Secondary | ICD-10-CM | POA: Diagnosis not present

## 2020-06-25 DIAGNOSIS — Z992 Dependence on renal dialysis: Secondary | ICD-10-CM | POA: Diagnosis not present

## 2020-06-26 ENCOUNTER — Ambulatory Visit: Payer: Medicare Other | Admitting: Occupational Therapy

## 2020-06-26 ENCOUNTER — Encounter: Payer: Self-pay | Admitting: Physical Medicine & Rehabilitation

## 2020-06-26 ENCOUNTER — Ambulatory Visit: Payer: Medicare Other | Admitting: Physical Therapy

## 2020-06-26 ENCOUNTER — Other Ambulatory Visit: Payer: Self-pay

## 2020-06-26 ENCOUNTER — Encounter: Payer: Medicare Other | Attending: Physical Medicine & Rehabilitation | Admitting: Physical Medicine & Rehabilitation

## 2020-06-26 VITALS — BP 144/87 | Temp 78.0°F | Ht 70.0 in | Wt 185.0 lb

## 2020-06-26 DIAGNOSIS — R41840 Attention and concentration deficit: Secondary | ICD-10-CM | POA: Diagnosis not present

## 2020-06-26 DIAGNOSIS — R2689 Other abnormalities of gait and mobility: Secondary | ICD-10-CM | POA: Diagnosis not present

## 2020-06-26 DIAGNOSIS — R269 Unspecified abnormalities of gait and mobility: Secondary | ICD-10-CM | POA: Diagnosis not present

## 2020-06-26 DIAGNOSIS — M6281 Muscle weakness (generalized): Secondary | ICD-10-CM

## 2020-06-26 DIAGNOSIS — R2681 Unsteadiness on feet: Secondary | ICD-10-CM

## 2020-06-26 DIAGNOSIS — K219 Gastro-esophageal reflux disease without esophagitis: Secondary | ICD-10-CM | POA: Diagnosis not present

## 2020-06-26 DIAGNOSIS — G40909 Epilepsy, unspecified, not intractable, without status epilepticus: Secondary | ICD-10-CM | POA: Diagnosis not present

## 2020-06-26 DIAGNOSIS — S065X9A Traumatic subdural hemorrhage with loss of consciousness of unspecified duration, initial encounter: Secondary | ICD-10-CM | POA: Insufficient documentation

## 2020-06-26 DIAGNOSIS — R41842 Visuospatial deficit: Secondary | ICD-10-CM

## 2020-06-26 DIAGNOSIS — R41844 Frontal lobe and executive function deficit: Secondary | ICD-10-CM | POA: Diagnosis not present

## 2020-06-26 DIAGNOSIS — M25631 Stiffness of right wrist, not elsewhere classified: Secondary | ICD-10-CM | POA: Diagnosis not present

## 2020-06-26 DIAGNOSIS — R278 Other lack of coordination: Secondary | ICD-10-CM

## 2020-06-26 DIAGNOSIS — G8110 Spastic hemiplegia affecting unspecified side: Secondary | ICD-10-CM | POA: Insufficient documentation

## 2020-06-26 DIAGNOSIS — K559 Vascular disorder of intestine, unspecified: Secondary | ICD-10-CM | POA: Diagnosis not present

## 2020-06-26 DIAGNOSIS — S065XAA Traumatic subdural hemorrhage with loss of consciousness status unknown, initial encounter: Secondary | ICD-10-CM

## 2020-06-26 NOTE — Therapy (Signed)
[facility] [address] [place], [place], [address] Phone: [phone]   Fax:  [phone]  Occupational Therapy Treatment  Patient Details  Name: Isaiah [name] [name] MRN: [id number] Date of Birth: 1966/04/20 Referring Provider (OT): Posey Pronto, Ankit A.   Encounter Date: 06/26/2020   OT End of Session - 06/26/20 1423    Visit Number 3    Number of Visits 17    Date for OT Re-Evaluation 08/09/20    Authorization Type Medicare    Authorization Time Period OOP has been met. No financial responsibility.    Authorization - Visit Number 3    Authorization - Number of Visits 10    Progress Note Due on Visit 10    OT Start Time 1325    OT Stop Time 1410    OT Time Calculation (min) 45 min    Activity Tolerance Patient tolerated treatment well    Behavior During Therapy WFL for tasks assessed/performed           Past Medical History:  Diagnosis Date  . A-fib ([place])   . Anemia   . Asthma   . DM type 2 (diabetes mellitus, type 2) ([place]) 06/09/2019  . ESRD (end stage renal disease) on dialysis ([place])   . Essential hypertension 06/09/2019  . GIB (gastrointestinal bleeding)    Recurrent episodes- 09/2014, 09/2015 and 05/2016  . Gout   . History of recent blood transfusion 10/27/14   2 Units PRBC's  . Hyperkalemia 07/2011  . Multiple myeloma ([place])   . OSA on CPAP   . Pulmonary embolism ([place]) 07/2011; 09/27/2014   a. Bilat PE 07/2011 - unclear cause, tx with 6 months Coumadin.;   . Seizure disorder ([place]) 06/09/2019  . Sepsis ([place])   . Sickle cell-thalassemia disease ([place])    a. Sickle cell trait.  . Sleep apnea   . Stroke ([place]) 09/2015   R-MCA, L-MCA, PCA and bilateral cerebellar complicated by DVT/PE  . Subdural hematoma ([place]) 05/2019    Past Surgical History:  Procedure Laterality Date  . AV FISTULA PLACEMENT Right 12/05/2015   Procedure: INSERTION OF ARTERIOVENOUS (AV) GORE-TEX GRAFT ARM;  Surgeon: Elam Dutch, MD;   Location: [facility];  Service: Vascular;  Laterality: Right;  . BONE MARROW BIOPSY    . CATARACT EXTRACTION  08/2019  . CATARACT EXTRACTION  09/2019  . CHOLECYSTECTOMY    . CHOLECYSTECTOMY  1990's?  . COLONOSCOPY WITH PROPOFOL N/A 01/22/2017   Procedure: COLONOSCOPY WITH PROPOFOL;  Surgeon: Wilford Corner, MD;  [facility];  Service: Endoscopy;  Laterality: N/A;  . EYE SURGERY Right   . INSERTION OF DIALYSIS CATHETER Right 10/16/2015   Procedure: INSERTION OF PALINDROME DIALYSIS CATHETER ;  Surgeon: Elam Dutch, MD;  Location: [facility];  Service: Vascular;  Laterality: Right;  . OTHER SURGICAL HISTORY     Retinal surgery  . PARS PLANA VITRECTOMY  02/17/2012   Procedure: PARS PLANA VITRECTOMY WITH 25 GAUGE;  Surgeon: Hayden Pedro, MD;  Location: [facility];  Service: Ophthalmology;  Laterality: Right;  . PERIPHERAL VASCULAR CATHETERIZATION N/A 03/20/2016   Procedure: A/V Shuntogram/Fistulagram;  Surgeon: Conrad [place], MD;  Location: [facility];  Service: Cardiovascular;  Laterality: N/A;  . TEE WITHOUT CARDIOVERSION N/A 12/02/2016   Procedure: TRANSESOPHAGEAL ECHOCARDIOGRAM (TEE);  Surgeon: Larey Dresser, MD;  Location: [facility];  Service: Cardiovascular;  Laterality: N/A;    There were no vitals filed for this visit.   Subjective Assessment -  06/26/20 1327    Subjective  Pt denies pain and reports no falls.    Patient is accompanied by: Family member   spouse   Pertinent History multiple myeloma (currently undergoing chemo treatment), DM, CVA, DVT, seizure disorder, SDH, ESRD on dialysis, and history of SDH x 2    Limitations Fall. No Driving. RUE fistula-dialysis; hx of seizures    Patient Stated Goals write better with his right hand, pick things up better with R hand    Currently in Pain? No/denies          Supine: bilateral shoulder flexion with thick foam w/ cues to prevent Rt shoulder compensations.  Seated: worked on Photographer,  stacking/unstacking cups w/ difficulty and assist needed to release d/t spasticity (and possibly apraxia) - pt may benefit from botox to wrist and finger flexors.  Pt instructed to also try and use Rt hand to assist in bathing (w/ bath mitt or loofah), eat finger foods, wipe table, etc. Pt instructed NOT to use Rt hand for anything: sharp, heavy, breakable, or hot - will need further review of above and issue as HEP Fabricated and fitted resting hand splint but unable to finish strapping d/t time constraints. Will finish next session                       OT Short Term Goals - 06/14/20 1803      OT SHORT TERM GOAL #1   Title Patient and caregiver will be independent with HEP 07/12/20    Time 4    Period Weeks    Status New    Target Date 07/12/20      OT SHORT TERM GOAL #2   Title Patient will perform UB dressing with min A in order to increase independence with ADLs    Time 4    Period Weeks    Status New      OT SHORT TERM GOAL #3   Title Patient will demonstrate improved function use of RUE as evidenced by increasing Box and Blocks score to 12 blocks or more.    Baseline RUE 9    Time 4    Period Weeks    Status New      OT SHORT TERM GOAL #4   Title Pt will improve RUE grip strength by at least 3 lbs or more in order to manage clothing with more independence with toileting    Time 4    Period Weeks    Status New      OT SHORT TERM GOAL #5   Title Pt will perform all aspects of toileting with min A consistently    Time 4    Period Weeks    Status New      OT SHORT TERM GOAL #6   Title Pt and spouse will report patient utilizing RUE for functional tasks 15% of the time in daily activities.    Time 4    Period Weeks    Status New             OT Long Term Goals - 06/14/20 1808      OT LONG TERM GOAL #1   Title Pt will be independent with updated HEP 08/09/2020    Time 8    Period Weeks    Status New    Target Date 08/09/20      OT LONG TERM  GOAL #2   Title Pt will improve RUE shoulder  flexion to 110 degrees in order to obtain lightweight object from overhead cabinet.    Time 8    Period Weeks    Status New      OT LONG TERM GOAL #3   Title Patient will demonstrate improved function use of RUE as evidenced by increasing Box and Blocks score to 17 blocks or more.    Time 8    Period Weeks    Status New      OT LONG TERM GOAL #4   Title Patient will perform UB dressing with supervision with verbal cues in order to increase indepedence with ADLs.    Time 8    Period Weeks    Status New      OT LONG TERM GOAL #5   Title Pt will improve RUE grip strength by at least 6 lbs in order to manage clothing with more independence with toileting    Time 8    Period Weeks    Status New      OT LONG TERM GOAL #6   Title Pt will perform all aspects of toileting with distant supervision for safety in order to increase independence with ADLs.    Time 8    Period Weeks    Status New      OT LONG TERM GOAL #7   Title Pt and spouse will report patient utilizing RUE for functional tasks 25% of the time in daily activities.    Time 8    Period Weeks    Status New                 Plan - 06/26/20 1424    Clinical Impression Statement Pt progressing towards goals. Pt with difficulty following verbal commands and decreased visuospatial awareness    OT Occupational Profile and History Detailed Assessment- Review of Records and additional review of physical, cognitive, psychosocial history related to current functional performance    Occupational performance deficits (Please refer to evaluation for details): IADL's;ADL's;Leisure;Social Participation    Body Structure / Function / Physical Skills ADL;Decreased knowledge of use of DME;Gait;Strength;Tone;GMC;Dexterity;Balance;Body mechanics;Proprioception;UE functional use;ROM;IADL;Coordination;FMC;Vision;Flexibility;Mobility    Cognitive Skills Attention;Problem Solve;Safety  Awareness;Sequencing;Energy/Drive;Thought;Learn;Memory;Understand;Perception    Rehab Potential Good    Clinical Decision Making Limited treatment options, no task modification necessary    Comorbidities Affecting Occupational Performance: May have comorbidities impacting occupational performance    Modification or Assistance to Complete Evaluation  No modification of tasks or assist necessary to complete eval    OT Frequency 2x / week    OT Duration 8 weeks    OT Treatment/Interventions Self-care/ADL training;Moist Heat;Fluidtherapy;DME and/or AE instruction;Balance training;Gait Training;Therapeutic activities;Cognitive remediation/compensation;Therapeutic exercise;Ultrasound;Neuromuscular education;Passive range of motion;Visual/perceptual remediation/compensation;Functional Mobility Training;Electrical Stimulation;Energy conservation;Manual Therapy;Patient/family education    Plan finish splint and issue, issue splint wear and care and initial HEP with supine BUE sh flexion, simple coordination and ADL tasks w/ RUE    Recommended Other Services Recommended ST referral - spouse asked to wait.    Consulted and Agree with Plan of Care Patient;Family member/caregiver    Family Member Consulted spouse           Patient will benefit from skilled therapeutic intervention in order to improve the following deficits and impairments:   Body Structure / Function / Physical Skills: ADL, Decreased knowledge of use of DME, Gait, Strength, Tone, GMC, Dexterity, Balance, Body mechanics, Proprioception, UE functional use, ROM, IADL, Coordination, FMC, Vision, Flexibility, Mobility Cognitive Skills: Attention, Problem Solve, Safety Awareness, Sequencing, Energy/Drive, Thought, Learn, Memory,  Understand, Perception     Visit Diagnosis: Other lack of coordination  Muscle weakness (generalized)  Visuospatial deficit  Unsteadiness on feet    Problem List Patient Active Problem List   Diagnosis Date  Noted  . Colitis 03/15/2020  . AMS (altered mental status) 10/05/2019  . Acute metabolic encephalopathy 07/05/2535  . Headache, unspecified 09/28/2019  . Abnormality of gait 09/13/2019  . Diabetes mellitus with end-stage renal disease ([place]) 08/07/2019  . Benign hypertensive kidney disease with chronic kidney disease 08/07/2019  . Hypothyroidism, unspecified 07/15/2019  . Spastic hemiparesis ([place]) 07/12/2019  . Dysphagia, oropharyngeal phase 07/05/2019  . Allergy, unspecified, initial encounter 06/27/2019  . PAF (paroxysmal atrial fibrillation) ([place])   . Bacteremia   . Labile blood pressure   . Labile blood glucose   . Controlled type 2 diabetes mellitus with hyperglycemia, without long-term current use of insulin ([place])   . ESRD on dialysis ([place])   . Right sided weakness   . SDH (subdural hematoma) ([place])   . Chronic intracranial subdural hematoma ([facility]) 06/09/2019  . Multiple myeloma not having achieved remission ([place]) 06/09/2019  . ESRD on hemodialysis ([place]) 06/09/2019  . Essential hypertension 06/09/2019  . DM type 2 (diabetes mellitus, type 2) ([place]) 06/09/2019  . Seizure disorder ([place]) 06/09/2019  . Aspiration pneumonia ([place]) 06/09/2019  . History of bacteremia 06/09/2019  . Atrial fibrillation, chronic ([place]) 06/09/2019  . Bilateral kidney stones 05/31/2019  . Proteus infection 05/28/2019  . History of restrictive pulmonary disease 04/07/2019  . Multiple myeloma ([place]) 05/17/2018  . Goals of care, counseling/discussion 05/17/2018  . Partial idiopathic epilepsy with seizures of localized onset, intractable, without status epilepticus ([place]) 11/02/2017  . Iron deficiency anemia, unspecified 01/22/2017  . COPD (chronic obstructive pulmonary disease) ([place]) 11/11/2016  . CVA (cerebral vascular accident) ([place]) 11/11/2016  . GERD (gastroesophageal reflux disease) 11/11/2016  . GI bleed 11/11/2016  . 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  . Obesity (BMI 30-39.9) 11/11/2016    . Pulmonary hypertension ([place]) 11/11/2016  . Patent foramen ovale 11/11/2016  . Hyperparathyroidism, secondary renal ([place]) 11/11/2016  . VTE (venous thromboembolism) 11/11/2016  . Hypotension 11/11/2016  . Systemic hypertension 11/11/2016  . Seizure ([place]) 11/11/2016  . Other seizures ([place]) 07/25/2016  . Encounter for removal of sutures 05/27/2016  . Ingrown nail 03/03/2016  . Onychomycosis due to dermatophyte 03/03/2016  . Other acquired hammer toe 03/03/2016  . Hemiparesis affecting right side as late effect of cerebrovascular accident ([place]) 02/28/2016  . Aphasia complicating stroke 64/40/3474  . Seizures ([place]) 01/08/2016  . History of ischemic left MCA stroke 01/08/2016  . Right sided weakness 01/08/2016  . Atrial fibrillation ([place]) 01/08/2016  . Leukocytosis 01/08/2016  . Benign essential HTN 01/08/2016  . Anemia of chronic disease 01/08/2016  . Controlled type 2 diabetes mellitus with diabetic nephropathy, without long-term current use of insulin ([place]) 01/08/2016  . History of DVT (deep vein thrombosis) 01/08/2016  . 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 Hypocalcemia 12/26/2015  . Long term (current) use of anticoagulants 11/07/2015  . Aftercare including intermittent dialysis ([place]) 11/02/2015  . Other specified coagulation defects ([place]) 11/02/2015  . Complication of vascular dialysis catheter 11/02/2015  . Diarrhea, unspecified 11/02/2015  . Fever, unspecified 11/02/2015  . Pruritus, unspecified 11/02/2015  . Shortness of breath 11/02/2015  . ESRD (end stage renal disease) on dialysis [facility])     Carey Bullocks, OTR/L 06/26/2020, 2:28 PM  [facility] [address] [place], [place], [address] Phone:  [phone]   Fax:  [phone]  Name: Isaiah [name] [facility] MRN: [id number] Date of Birth: [birth year]

## 2020-06-26 NOTE — Progress Notes (Addendum)
Subjective:    Patient ID: Isaiah [name], male    DOB: 11-Jul-1966, 54 y.o.   MRN: [id number]  HPI [place] history of T2DM, A fib, ESRD-HD MWF, CVA, DVT/PE s/p IVC filter, seizure disorder, MM with recent admission to [facility] for stem cell transplant but procedure aborted due to findings of proteus mirabilis bacteremia, aspiration PNA, acute on chronic SDH, findings of bilateral renal calculi as well as breakthrough seizures presents for follow up for SDH.  Last clinic visit on 05/15/20.  Mother supplements history. Since that time, pt is still in therapies.  He obtained his transport chair. He has not had a breakthrough seizure since last visit. Spasticity is controlled. Denies falls.  Pain Inventory Average Pain 0 Pain Right Now 0 My pain is na  LOCATION OF PAIN  NA  BOWEL Number of stools per week: 1-2 Oral laxative use No  Type of laxative  Enema or suppository use No  History of colostomy No  Incontinent No   BLADDER Dialysis In and out cath, frequency  Able to self cath No  Bladder incontinence No  Frequent urination No  Leakage with coughing No  Difficulty starting stream No  Incomplete bladder emptying No    Mobility walk with assistance use a walker ability to climb steps?  yes do you drive?  no use a wheelchair needs help with transfers  Function disabled: date disabled 09/09/2015 I need assistance with the following:  feeding, dressing, bathing and toileting  Neuro/Psych weakness trouble walking  Prior Studies Any changes since last visit?  no  Physicians involved in your care Primary care [name], MD   Family History  Problem Relation Age of Onset  . Hypertension Mother   . Hypertension Father   . Stroke Father   . Sickle cell anemia Brother   . Diabetes Mother   . Sickle cell anemia Brother   . Diabetes type I Brother   . Kidney disease Brother    Social History   Socioeconomic History  . Marital status: Married    Spouse name:  sylvia  . Number of children: 1  . Years of education: college  . Highest education level: Not on file  Occupational History  . Occupation: [employer]  . Occupation: disability  Tobacco Use  . Smoking status: Never Smoker  . Smokeless tobacco: Never Used  Vaping Use  . Vaping Use: Never used  Substance and Sexual Activity  . Alcohol use: Not Currently  . Drug use: Never  . Sexual activity: Yes    Partners: Female  Other Topics Concern  . Not on file  Social History Narrative   ** Merged History Encounter **       Pt lives in 2 story home with his wife and 1 son   Has masters degree in psychology   Currently disabled.   Right handed       Social Determinants of Health   Financial Resource Strain: Low Risk   . Difficulty of Paying Living Expenses: Not hard at all  Food Insecurity: No Food Insecurity  . Worried About Charity fundraiser in the Last Year: Never true  . Ran Out of Food in the Last Year: Never true  Transportation Needs: No Transportation Needs  . Lack of Transportation (Medical): No  . Lack of Transportation (Non-Medical): No  Physical Activity: Insufficiently Active  . Days of Exercise per Week: 4 days  . Minutes of Exercise per Session: 30 min  Stress: No  Stress Concern Present  . Feeling of Stress : Not at all  Social Connections:   . Frequency of Communication with Friends and Family: Not on file  . Frequency of Social Gatherings with Friends and Family: Not on file  . Attends Religious Services: Not on file  . Active Member of Clubs or Organizations: Not on file  . Attends Archivist Meetings: Not on file  . Marital Status: Not on file   Past Surgical History:  Procedure Laterality Date  . AV FISTULA PLACEMENT Right 12/05/2015   Procedure: INSERTION OF ARTERIOVENOUS (AV) GORE-TEX GRAFT ARM;  Surgeon: Elam Dutch, MD;  Location: [facility];  Service: Vascular;  Laterality: Right;  . BONE MARROW BIOPSY    . CATARACT  EXTRACTION  08/2019  . CATARACT EXTRACTION  09/2019  . CHOLECYSTECTOMY    . CHOLECYSTECTOMY  1990's?  . COLONOSCOPY WITH PROPOFOL N/A 01/22/2017   Procedure: COLONOSCOPY WITH PROPOFOL;  Surgeon: Wilford Corner, MD;  [facility];  Service: Endoscopy;  Laterality: N/A;  . EYE SURGERY Right   . INSERTION OF DIALYSIS CATHETER Right 10/16/2015   Procedure: INSERTION OF PALINDROME DIALYSIS CATHETER ;  Surgeon: Elam Dutch, MD;  Location: [facility];  Service: Vascular;  Laterality: Right;  . OTHER SURGICAL HISTORY     Retinal surgery  . PARS PLANA VITRECTOMY  02/17/2012   Procedure: PARS PLANA VITRECTOMY WITH 25 GAUGE;  Surgeon: Hayden Pedro, MD;  Location: [facility];  Service: Ophthalmology;  Laterality: Right;  . PERIPHERAL VASCULAR CATHETERIZATION N/A 03/20/2016   Procedure: A/V Shuntogram/Fistulagram;  Surgeon: Conrad Cleone, MD;  Location: [facility];  Service: Cardiovascular;  Laterality: N/A;  . TEE WITHOUT CARDIOVERSION N/A 12/02/2016   Procedure: TRANSESOPHAGEAL ECHOCARDIOGRAM (TEE);  Surgeon: Larey Dresser, MD;  Location: [facility];  Service: Cardiovascular;  Laterality: N/A;   Past Medical History:  Diagnosis Date  . A-fib ([place])   . Anemia   . Asthma   . DM type 2 (diabetes mellitus, type 2) ([place]) 06/09/2019  . ESRD (end stage renal disease) on dialysis ([place])   . Essential hypertension 06/09/2019  . GIB (gastrointestinal bleeding)    Recurrent episodes- 09/2014, 09/2015 and 05/2016  . Gout   . History of recent blood transfusion 10/27/14   2 Units PRBC's  . Hyperkalemia 07/2011  . Multiple myeloma ([place])   . OSA on CPAP   . Pulmonary embolism ([place]) 07/2011; 09/27/2014   a. Bilat PE 07/2011 - unclear cause, tx with 6 months Coumadin.;   . Seizure disorder ([place]) 06/09/2019  . Sepsis ([place])   . Sickle cell-thalassemia disease ([place])    a. Sickle cell trait.  . Sleep apnea   . Stroke ([place]) 09/2015   R-MCA, L-MCA, PCA and bilateral cerebellar complicated by DVT/PE   . Subdural hematoma ([facility]) 05/2019   BP (!) 144/87   Temp (!) 78 F (25.6 C)   Ht 5' 10"  (1.778 m)   Wt 185 lb (83.9 kg)   SpO2 99%   BMI 26.54 kg/m   Opioid Risk Score:   Fall Risk Score:  `1  Depression screen PHQ 2/9  Depression screen [name] 2/9 04/12/2020 03/08/2020 09/22/2019 09/13/2019  Decreased Interest 0 0 0 0  Down, Depressed, Hopeless 0 0 0 0  PHQ - 2 Score 0 0 0 0  Some recent data might be hidden      Review of Systems  Constitutional: Negative.   HENT: Negative.   Eyes: Negative.  Respiratory: Negative.   Cardiovascular: Negative.   Gastrointestinal: Negative.   Endocrine:       High blood sugar   Genitourinary: Negative.   Musculoskeletal: Positive for arthralgias, gait problem and myalgias.  Allergic/Immunologic: Negative.   Neurological: Positive for seizures, speech difficulty and weakness.  Hematological: Negative.   Psychiatric/Behavioral: Negative.   All other systems reviewed and are negative.      Objective:   Physical Exam  Constitutional: No distress . Vital signs reviewed. HENT: Normocephalic.  Atraumatic. Eyes: EOMI. No discharge. Cardiovascular: No JVD.   Respiratory: Normal effort.  No stridor.   GI: Non-distended.   Skin: Warm and dry.  Intact. Psych: Normal mood.  Normal behavior. Musc: No edema in extremities.  No tenderness in extremities. Gait: Short step/stride length with assistance from gait belt, some improvement Neurologic: Alert HOH Dysarthria, stable  Motor: RUE: 4+/5 RLE: 4+/5 MAS: Right  Wrist/finger flexors trace   Ankle dorsiflexors 0/4    Assessment & Plan:  Male with history of T2DM, A fib, ESRD-HD MWF, CVA, DVT/PE s/p IVC filter, seizure disorder, MM with recent admission to [facility] for stem cell transplant but procedure aborted due to findings of proteus mirabilis bacteremia, aspiration PNA, acute on chronic SDH, findings of bilateral renal calculi as well as breakthrough seizures presents for follow up for  SDH.   1. Impaired ADLs/mobility and function secondary to acute on chronic SDH with 5 mm of midline shift to left- with R hemiparesis and dysarthria             Continue HEP  Continue follow up with Neurology  Continue OT   2. Seizure disorder:              Antiepilectics adjusted             Continue follow up with Neurology   3. Spasticity in setting of 2017 CVA with previous R hemiparesis             Minimal at present   4. Gait abnormality with falls             Continue walker for safety, patient would like to graduate to cane             Continue HEP/OT  Wheelchair/transport chair ordered 05/2020  Completed PT  Patient would like to RTC 3 months

## 2020-06-26 NOTE — Patient Instructions (Addendum)
Access Code: [email or web address] URL: [email or web address] Date: 06/26/2020 Prepared by: Mady Haagensen  Exercises Seated Hip Abduction with Resistance - 1 x daily - 5 x weekly - 1 sets - 10 reps Seated Knee Extension with Resistance - 1 x daily - 5 x weekly - 1 sets - 10 reps Heel Toe Raises with Counter Support - 1 x daily - 5 x weekly - 1 sets - 10 reps Side Stepping with Counter Support - 1 x daily - 5 x weekly - 1 sets - 3 reps Standing Single Leg Stance with Counter Support - 1 x daily - 5 x weekly - 1 sets - 3 reps - 15 hold Standing Tandem Balance with Counter Support - 1 x daily - 5 x weekly - 1 sets - 3 reps - 30 hold  Added 06/26/2020: Standing Hip Abduction with Counter Support - 1 x daily - 3 x weekly - 3 sets - 10 reps Standing Hip Extension with Counter Support - 1 x daily - 3 x weekly - 3 sets - 10 reps Alternating Step Taps with Counter Support - 1 x daily - 3 x weekly - 2-3 sets - 10 reps

## 2020-06-27 DIAGNOSIS — D631 Anemia in chronic kidney disease: Secondary | ICD-10-CM | POA: Diagnosis not present

## 2020-06-27 DIAGNOSIS — Z992 Dependence on renal dialysis: Secondary | ICD-10-CM | POA: Diagnosis not present

## 2020-06-27 DIAGNOSIS — E1129 Type 2 diabetes mellitus with other diabetic kidney complication: Secondary | ICD-10-CM | POA: Diagnosis not present

## 2020-06-27 DIAGNOSIS — N2581 Secondary hyperparathyroidism of renal origin: Secondary | ICD-10-CM | POA: Diagnosis not present

## 2020-06-27 DIAGNOSIS — N186 End stage renal disease: Secondary | ICD-10-CM | POA: Diagnosis not present

## 2020-06-27 NOTE — Therapy (Signed)
[facility] [address] [place], [place], [address] Phone: [phone]   Fax:  [phone]  Physical Therapy Treatment  Patient Details  Name: Isaiah [name] MRN: [id number] Date of Birth: 11/10/1965 Referring Provider (PT): Ankit Patel, MD   Encounter Date: 06/26/2020   PT End of Session - 06/27/20 0848    Visit Number 15    Number of Visits 27   per recent recert wk of 9/23   Date for PT Re-Evaluation 08/30/20    Authorization Type Medicare; PN/Recert 05/31/20    Progress Note Due on Visit 20    PT Start Time 1405    PT Stop Time 1447    PT Time Calculation (min) 42 min    Equipment Utilized During Treatment Gait belt    Activity Tolerance Patient tolerated treatment well    Behavior During Therapy WFL for tasks assessed/performed;Flat affect           Past Medical History:  Diagnosis Date  . A-fib (HCC)   . Anemia   . Asthma   . DM type 2 (diabetes mellitus, type 2) (HCC) 06/09/2019  . ESRD (end stage renal disease) on dialysis (HCC)   . Essential hypertension 06/09/2019  . GIB (gastrointestinal bleeding)    Recurrent episodes- 09/2014, 09/2015 and 05/2016  . Gout   . History of recent blood transfusion 10/27/14   2 Units PRBC's  . Hyperkalemia 07/2011  . Multiple myeloma (HCC)   . OSA on CPAP   . Pulmonary embolism (HCC) 07/2011; 09/27/2014   a. Bilat PE 07/2011 - unclear cause, tx with 6 months Coumadin.;   . Seizure disorder (HCC) 06/09/2019  . Sepsis (HCC)   . Sickle cell-thalassemia disease (HCC)    a. Sickle cell trait.  . Sleep apnea   . Stroke (HCC) 09/2015   R-MCA, L-MCA, PCA and bilateral cerebellar complicated by DVT/PE  . Subdural hematoma (HCC) 05/2019    Past Surgical History:  Procedure Laterality Date  . AV FISTULA PLACEMENT Right 12/05/2015   Procedure: INSERTION OF ARTERIOVENOUS (AV) GORE-TEX GRAFT ARM;  Surgeon: Charles E Fields, MD;  Location: [facility] OR;  Service: Vascular;   Laterality: Right;  . BONE MARROW BIOPSY    . CATARACT EXTRACTION  08/2019  . CATARACT EXTRACTION  09/2019  . CHOLECYSTECTOMY    . CHOLECYSTECTOMY  1990's?  . COLONOSCOPY WITH PROPOFOL N/A 01/22/2017   Procedure: COLONOSCOPY WITH PROPOFOL;  Surgeon: Schooler, Vincent, MD;  Location: [facility];  Service: Endoscopy;  Laterality: N/A;  . EYE SURGERY Right   . INSERTION OF DIALYSIS CATHETER Right 10/16/2015   Procedure: INSERTION OF PALINDROME DIALYSIS CATHETER ;  Surgeon: Charles E Fields, MD;  Location: [facility] OR;  Service: Vascular;  Laterality: Right;  . OTHER SURGICAL HISTORY     Retinal surgery  . PARS PLANA VITRECTOMY  02/17/2012   Procedure: PARS PLANA VITRECTOMY WITH 25 GAUGE;  Surgeon: John D Matthews, MD;  Location: [facility] OR;  Service: Ophthalmology;  Laterality: Right;  . PERIPHERAL VASCULAR CATHETERIZATION N/A 03/20/2016   Procedure: A/V Shuntogram/Fistulagram;  Surgeon: Brian L Chen, MD;  Location: [facility] INVASIVE CV LAB;  Service: Cardiovascular;  Laterality: N/A;  . TEE WITHOUT CARDIOVERSION N/A 12/02/2016   Procedure: TRANSESOPHAGEAL ECHOCARDIOGRAM (TEE);  Surgeon: Dalton S McLean, MD;  Location: [facility];  Service: Cardiovascular;  Laterality: N/A;    There were no vitals filed for this visit.   Subjective Assessment - 06/26/20 1414    Subjective Mom here with me   today from out of town    Patient is accompained by: Family member   wife, provides history   Pertinent History multiple myeloma, ESRD on dialysis, DM type II, DVT with IVC filter, CVA, COPD, a-fib; chemo treatments down to 1x/month (next is August 17th)    Patient Stated Goals To get back to walk without the walker, back to his baseline    Currently in Pain? No/denies                             [facility] Adult PT Treatment/Exercise - 06/27/20 0001      Transfers   Transfers Sit to Stand;Stand to Sit    Sit to Stand 5: Supervision;4: Min guard    Sit to Stand Details Verbal cues for technique;Verbal  cues for sequencing    Sit to Stand Details (indicate cue type and reason) Cues for hand placement, to not reach up to RW for transfer assist    Stand to Sit 4: Min guard;With upper extremity assist;To bed;To chair/3-in-1;Uncontrolled descent    Stand to Sit Details (indicate cue type and reason) Tactile cues for sequencing;Verbal cues for technique      Ambulation/Gait   Ambulation/Gait Yes    Ambulation/Gait Assistance 4: Min guard    Ambulation Distance (Feet) 40 Feet   x 4, between activities in gym   Assistive device Rolling walker    Gait Pattern Step-through pattern;Trunk flexed;Poor foot clearance - left;Poor foot clearance - right    Ambulation Surface Level;Indoor      Knee/Hip Exercises: Aerobic   Stepper Seated SciFit Stepper, Level 3, BLE only x 7 minutes. Performed for improved lower extremity endurance.  Pt keeps RPM >50-60 for duration of time.      Knee/Hip Exercises: Standing   Hip Abduction Stengthening;Right;Left;2 sets;10 reps;Knee straight    Abduction Limitations cues for upright posture, decreased forward lean through UEs    Hip Extension Stengthening;Right;Left;2 sets;10 reps;Knee straight    Extension Limitations cues for upright posture, decreased forward lean through BLEs; cues for extended knee    Forward Step Up Right;Left;1 set;Hand Hold: 2;Step Height: 6";10 reps               Balance Exercises - 06/26/20 1405      Balance Exercises: Standing   Standing Eyes Opened Narrow base of support (BOS);Head turns;5 reps   Head nods, UE support at step rails   SLS with Vectors Solid surface;Other reps (comment);Limitations;Upper extremity assist 1    SLS with Vectors Limitations Alternating step taps to 4" block, 2 sets x 10 reps    Standing, One Foot on a Step Eyes open;5 reps;6 inch;Limitations;10 secs    Standing, One Foot on a Step Limitations 10 sec hold without UE support; with foot propped, head turns/head nods x 5 with 1 UE support.              PT Education - 06/27/20 0847    Education Details Additions to HEP    Person(s) Educated Patient;Parent(s)    Methods Explanation;Demonstration;Handout    Comprehension Verbalized understanding;Returned demonstration;Verbal cues required            PT Short Term Goals - 06/05/20 2149      PT SHORT TERM GOAL #1   Title Pt will perform HEP independenlty for improved strength, balance and gait.    Baseline performs, but needs supervision for safety in standing    Time 4  Period Weeks    Status Partially Met    Target Date 06/28/20      PT SHORT TERM GOAL #2   Title Pt will improve gait velocity to at least 1.3 ft/sec for improved gait efficiency and safety.    Baseline 1.12ft/s with RW 05/31/20    Time 4    Period Weeks    Status On-going    Target Date 06/28/20             PT Long Term Goals - 06/05/20 2150      PT LONG TERM GOAL #1   Title Pt will be independent with progression of HEP for improved strength, blaance, gait.    Time 8    Period Weeks    Status On-going    Target Date 07/26/20      PT LONG TERM GOAL #2   Title Pt will imrpove Berg Balance score to at least 33/56 for decreased fall risk.    Baseline 30/56 05/31/20    Time 8    Period Weeks    Status On-going    Target Date 07/26/20      PT LONG TERM GOAL #3   Title Pt will improve TUG score to less than or equal to 30 seconds for decreased fall risk.    Baseline 37 sec 05/31/20    Time 8    Period Weeks    Status On-going    Target Date 07/26/20      PT LONG TERM GOAL #4   Title Pt will improve gait velocity to at least 1.8 ft/sec for improved gait velocity and decreased fall risk.    Baseline 1.12 ft/s 05/31/20    Time 8    Period Weeks    Status On-going    Target Date 07/26/20      PT LONG TERM GOAL #5   Title Pt will ascend/descent 12 steps with both handrails with supervision only, to negotiate steps at home.    Baseline not attempted 05/31/20    Time 8    Period Weeks     Status On-going    Target Date 07/26/20                 Plan - 06/27/20 0849    Clinical Impression Statement Worked on strengthening and balance this visit, with additions to HEP for standing exercises at home.  He continues to need close supervision/min guard for activitiies with lessened UE support    Personal Factors and Comorbidities Comorbidity 3+    Comorbidities PMH:  multiple myeloma, ESRD on dialysis, DM type II, DVT with IVC filter, CVA, COPD, a-fib    Examination-Activity Limitations Locomotion Level;Transfers;Stairs;Stand    Examination-Participation Restrictions Community Activity    Stability/Clinical Decision Making Evolving/Moderate complexity    Rehab Potential Good    PT Frequency 2x / week    PT Duration 8 weeks   per recert 05/31/20   PT Treatment/Interventions ADLs/Self Care Home Management;DME Instruction;Neuromuscular re-education;Balance training;Therapeutic exercise;Therapeutic activities;Functional mobility training;Stair training;Gait training;Patient/family education    PT Next Visit Plan Review updates to HEP; continue gait trianing with obstacle negotiation; functional strength and balance exercises working to improve static and dynamic standing balance    PT Home Exercise Plan Access Code [email or web address]    Consulted and Agree with Plan of Care Patient           Patient will benefit from skilled therapeutic intervention in order to improve the following deficits and impairments:  Abnormal gait, Difficulty   walking, Decreased safety awareness, Decreased balance, Decreased mobility, Decreased strength  Visit Diagnosis: Muscle weakness (generalized)  Unsteadiness on feet  Other abnormalities of gait and mobility     Problem List Patient Active Problem List   Diagnosis Date Noted  . Colitis 03/15/2020  . AMS (altered mental status) 10/05/2019  . Acute metabolic encephalopathy 95/32/0233  . Headache, unspecified 09/28/2019  . Abnormality of gait  09/13/2019  . Diabetes mellitus with end-stage renal disease ([place]) 08/07/2019  . Benign hypertensive kidney disease with chronic kidney disease 08/07/2019  . Hypothyroidism, unspecified 07/15/2019  . Spastic hemiparesis ([place]) 07/12/2019  . Dysphagia, oropharyngeal phase 07/05/2019  . Allergy, unspecified, initial encounter 06/27/2019  . PAF (paroxysmal atrial fibrillation) ([place])   . Bacteremia   . Labile blood pressure   . Labile blood glucose   . Controlled type 2 diabetes mellitus with hyperglycemia, without long-term current use of insulin ([place])   . ESRD on dialysis ([place])   . Right sided weakness   . SDH (subdural hematoma) ([place])   . Chronic intracranial subdural hematoma (HCC) 06/09/2019  . Multiple myeloma not having achieved remission ([place]) 06/09/2019  . ESRD on hemodialysis ([place]) 06/09/2019  . Essential hypertension 06/09/2019  . DM type 2 (diabetes mellitus, type 2) ([place]) 06/09/2019  . Seizure disorder ([place]) 06/09/2019  . Aspiration pneumonia ([place]) 06/09/2019  . History of bacteremia 06/09/2019  . Atrial fibrillation, chronic ([place]) 06/09/2019  . Bilateral kidney stones 05/31/2019  . Proteus infection 05/28/2019  . History of restrictive pulmonary disease 04/07/2019  . Multiple myeloma ([place]) 05/17/2018  . Goals of care, counseling/discussion 05/17/2018  . Partial idiopathic epilepsy with seizures of localized onset, intractable, without status epilepticus ([place]) 11/02/2017  . Iron deficiency anemia, unspecified 01/22/2017  . COPD (chronic obstructive pulmonary disease) ([place]) 11/11/2016  . CVA (cerebral vascular accident) ([place]) 11/11/2016  . GERD (gastroesophageal reflux disease) 11/11/2016  . GI bleed 11/11/2016  . 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  . Obesity (BMI 30-39.9) 11/11/2016  . Pulmonary hypertension ([place]) 11/11/2016  . Patent foramen ovale 11/11/2016  . Hyperparathyroidism, secondary renal ([name]) 11/11/2016  . VTE (venous thromboembolism)  11/11/2016  . Hypotension 11/11/2016  . Systemic hypertension 11/11/2016  . Seizure ([place]) 11/11/2016  . Other seizures ([place]) 07/25/2016  . Encounter for removal of sutures 05/27/2016  . Ingrown nail 03/03/2016  . Onychomycosis due to dermatophyte 03/03/2016  . Other acquired hammer toe 03/03/2016  . Hemiparesis affecting right side as late effect of cerebrovascular accident ([place]) 02/28/2016  . Aphasia complicating stroke 43/56/8616  . Seizures ([place]) 01/08/2016  . History of ischemic left MCA stroke 01/08/2016  . Right sided weakness 01/08/2016  . Atrial fibrillation ([place]) 01/08/2016  . Leukocytosis 01/08/2016  . Benign essential HTN 01/08/2016  . Anemia of chronic disease 01/08/2016  . Controlled type 2 diabetes mellitus with diabetic nephropathy, without long-term current use of insulin () 01/08/2016  . History of DVT (deep vein thrombosis) 01/08/2016  . 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 Hypocalcemia 12/26/2015  . Long term (current) use of anticoagulants 11/07/2015  . Aftercare including intermittent dialysis ([place]) 11/02/2015  . Other specified coagulation defects ([place]) 11/02/2015  . Complication of vascular dialysis catheter 11/02/2015  . Diarrhea, unspecified 11/02/2015  . Fever, unspecified 11/02/2015  . Pruritus, unspecified 11/02/2015  . Shortness of breath 11/02/2015  . ESRD (end stage renal disease) on dialysis ([place])     Saryah Loper W. 06/27/2020, 8:58 AM  Frazier Butt., PT   [facility] [phone] [place], [place], [address] Phone: [phone]   Fax:  [phone]  Name: Isaiah [name] MRN: [id number] Date of Birth: 01/11/1966   

## 2020-06-28 ENCOUNTER — Other Ambulatory Visit: Payer: Self-pay

## 2020-06-28 ENCOUNTER — Encounter: Payer: Self-pay | Admitting: Occupational Therapy

## 2020-06-28 ENCOUNTER — Ambulatory Visit: Payer: Medicare Other | Admitting: Physical Therapy

## 2020-06-28 ENCOUNTER — Ambulatory Visit: Payer: Medicare Other | Admitting: Occupational Therapy

## 2020-06-28 DIAGNOSIS — R2689 Other abnormalities of gait and mobility: Secondary | ICD-10-CM | POA: Diagnosis not present

## 2020-06-28 DIAGNOSIS — R2681 Unsteadiness on feet: Secondary | ICD-10-CM

## 2020-06-28 DIAGNOSIS — R41842 Visuospatial deficit: Secondary | ICD-10-CM

## 2020-06-28 DIAGNOSIS — R41844 Frontal lobe and executive function deficit: Secondary | ICD-10-CM | POA: Diagnosis not present

## 2020-06-28 DIAGNOSIS — M6281 Muscle weakness (generalized): Secondary | ICD-10-CM | POA: Diagnosis not present

## 2020-06-28 DIAGNOSIS — R278 Other lack of coordination: Secondary | ICD-10-CM | POA: Diagnosis not present

## 2020-06-28 DIAGNOSIS — M25631 Stiffness of right wrist, not elsewhere classified: Secondary | ICD-10-CM | POA: Diagnosis not present

## 2020-06-28 DIAGNOSIS — R41840 Attention and concentration deficit: Secondary | ICD-10-CM | POA: Diagnosis not present

## 2020-06-28 DIAGNOSIS — S065X9A Traumatic subdural hemorrhage with loss of consciousness of unspecified duration, initial encounter: Secondary | ICD-10-CM | POA: Diagnosis not present

## 2020-06-28 NOTE — Therapy (Signed)
[facility] [address] [place], [place], [address] Phone: [phone]   Fax:  [phone]  Physical Therapy Treatment  Patient Details  Name: Isaiah [name] Navos MRN: [id number] Date of Birth: [birth year] Referring Provider (PT): Delice Lesch, MD   Encounter Date: 06/28/2020   PT End of Session - 06/28/20 1725    Visit Number 16    Number of Visits 27   per recent recert wk of 8/33   Date for PT Re-Evaluation 08/30/20    Authorization Type Medicare; PN/Recert 05/02/04    Progress Note Due on Visit 20    PT Start Time 1404    PT Stop Time 1445    PT Time Calculation (min) 41 min    Equipment Utilized During Treatment Gait belt    Activity Tolerance Patient tolerated treatment well    Behavior During Therapy [facility] for tasks assessed/performed;Flat affect           Past Medical History:  Diagnosis Date  . A-fib ([place])   . Anemia   . Asthma   . DM type 2 (diabetes mellitus, type 2) ([place]) 06/09/2019  . ESRD (end stage renal disease) on dialysis ([place])   . Essential hypertension 06/09/2019  . GIB (gastrointestinal bleeding)    Recurrent episodes- 09/2014, 09/2015 and 05/2016  . Gout   . History of recent blood transfusion 10/27/14   2 Units PRBC's  . Hyperkalemia 07/2011  . Multiple myeloma ([place])   . OSA on CPAP   . Pulmonary embolism ([place]) 07/2011; 09/27/2014   a. Bilat PE 07/2011 - unclear cause, tx with 6 months Coumadin.;   . Seizure disorder ([place]) 06/09/2019  . Sepsis ([place])   . Sickle cell-thalassemia disease ([place])    a. Sickle cell trait.  . Sleep apnea   . Stroke ([place]) 09/2015   R-MCA, L-MCA, PCA and bilateral cerebellar complicated by DVT/PE  . Subdural hematoma ([place]) 05/2019    Past Surgical History:  Procedure Laterality Date  . AV FISTULA PLACEMENT Right 12/05/2015   Procedure: INSERTION OF ARTERIOVENOUS (AV) GORE-TEX GRAFT ARM;  Surgeon: Elam Dutch, MD;  Location: [facility];  Service: Vascular;   Laterality: Right;  . BONE MARROW BIOPSY    . CATARACT EXTRACTION  08/2019  . CATARACT EXTRACTION  09/2019  . CHOLECYSTECTOMY    . CHOLECYSTECTOMY  1990's?  . COLONOSCOPY WITH PROPOFOL N/A 01/22/2017   Procedure: COLONOSCOPY WITH PROPOFOL;  Surgeon: Wilford Corner, MD;  Location: [facility];  Service: Endoscopy;  Laterality: N/A;  . EYE SURGERY Right   . INSERTION OF DIALYSIS CATHETER Right 10/16/2015   Procedure: INSERTION OF PALINDROME DIALYSIS CATHETER ;  Surgeon: Elam Dutch, MD;  Location: [facility];  Service: Vascular;  Laterality: Right;  . OTHER SURGICAL HISTORY     Retinal surgery  . PARS PLANA VITRECTOMY  02/17/2012   Procedure: PARS PLANA VITRECTOMY WITH 25 GAUGE;  Surgeon: Hayden Pedro, MD;  Location: [facility];  Service: Ophthalmology;  Laterality: Right;  . PERIPHERAL VASCULAR CATHETERIZATION N/A 03/20/2016   Procedure: A/V Shuntogram/Fistulagram;  Surgeon: Conrad Pittsburg, MD;  Location: [facility];  Service: Cardiovascular;  Laterality: N/A;  . TEE WITHOUT CARDIOVERSION N/A 12/02/2016   Procedure: TRANSESOPHAGEAL ECHOCARDIOGRAM (TEE);  Surgeon: Larey Dresser, MD;  Location: [facility];  Service: Cardiovascular;  Laterality: N/A;    There were no vitals filed for this visit.   Subjective Assessment - 06/28/20 1409    Subjective Let's start on the  machine.  No changes since last visit.  Dialysis was hard yesterday.    Patient is accompained by: Family member   wife, provides history   Pertinent History multiple myeloma, ESRD on dialysis, DM type II, DVT with IVC filter, CVA, COPD, a-fib; chemo treatments down to 1x/month (next is August 17th)    Patient Stated Goals To get back to walk without the walker, back to his baseline    Currently in Pain? No/denies                             [facility] Adult PT Treatment/Exercise - 06/28/20 0001      Ambulation/Gait   Ambulation/Gait Yes    Ambulation/Gait Assistance 4: Min guard    Ambulation/Gait  Assistance Details Veers to R walking into gym area, needs cues to avoid furniture on R.     Ambulation Distance (Feet) 110 Feet   30 ft x 2, including obstacle negotiation   Assistive device Rolling walker    Gait Pattern Step-through pattern;Trunk flexed;Poor foot clearance - left;Poor foot clearance - right    Ambulation Surface Level;Indoor    Gait Comments Obstacle negotiation, using RW, walking around computer cart, chair, black beams, for improved turns with gait.  One near LOB and PT provides cues to turn walker, then turn feet, to be more steady.  Min guard provided throughout.      Exercises   Exercises Knee/Hip      Knee/Hip Exercises: Aerobic   Stepper Seated SciFit Stepper, Level 3, BLE only x 8 minutes. Performed for improved lower extremity endurance.  Pt keeps RPM >50-60 for duration of time.      Knee/Hip Exercises: Standing   Functional Squat 1 set;10 reps    Functional Squat Limitations Cues for glut activation/upright posture between reps          Reviewed HEP: (performed x 1 set of 10 reps) Standing Hip Abduction with Counter Support - 1 x daily - 3 x weekly - 3 sets - 10 reps Standing Hip Extension with Counter Support - 1 x daily - 3 x weekly - 3 sets - 10 reps (cues for knee extension) Alternating Step Taps with Counter Support - 1 x daily - 3 x weekly - 2-3 sets - 10 reps         Balance Exercises - 06/28/20 0001      Balance Exercises: Standing   SLS with Vectors Solid surface;Other reps (comment);Limitations;Upper extremity assist 1    SLS with Vectors Limitations Alternating step taps x 10 reps, 1 UE support    Standing, One Foot on a Step Eyes open;Limitations;10 secs;4 inch;3 reps   1 UE support   Standing, One Foot on a Step Limitations With foot propped head turns, head nods x 5 reps, 2 sets.    Retro Gait 1 rep   Fwd back walk in parallel bars   Other Standing Exercises Using 2" black obstacle, forward step over and return to midline, x 10 reps,  BUE support>1 UE support, then side step over obstacle x 10 reps with BUE support    Other Standing Exercises Comments Forward walk in parallel bars, with BUE support x 2 reps, then LUE support only, x 4 reps, min assist and cues for step length, widened BOS               PT Short Term Goals - 06/05/20 2149      PT SHORT  TERM GOAL #1   Title Pt will perform HEP independenlty for improved strength, balance and gait.    Baseline performs, but needs supervision for safety in standing    Time 4    Period Weeks    Status Partially Met    Target Date 06/28/20      PT SHORT TERM GOAL #2   Title Pt will improve gait velocity to at least 1.3 ft/sec for improved gait efficiency and safety.    Baseline 1.43f/s with RW 05/31/20    Time 4    Period Weeks    Status On-going    Target Date 06/28/20             PT Long Term Goals - 06/05/20 2150      PT LONG TERM GOAL #1   Title Pt will be independent with progression of HEP for improved strength, blaance, gait.    Time 8    Period Weeks    Status On-going    Target Date 07/26/20      PT LONG TERM GOAL #2   Title Pt will imrpove Berg Balance score to at least 33/56 for decreased fall risk.    Baseline 30/56 05/31/20    Time 8    Period Weeks    Status On-going    Target Date 07/26/20      PT LONG TERM GOAL #3   Title Pt will improve TUG score to less than or equal to 30 seconds for decreased fall risk.    Baseline 37 sec 05/31/20    Time 8    Period Weeks    Status On-going    Target Date 07/26/20      PT LONG TERM GOAL #4   Title Pt will improve gait velocity to at least 1.8 ft/sec for improved gait velocity and decreased fall risk.    Baseline 1.12 ft/s 05/31/20    Time 8    Period Weeks    Status On-going    Target Date 07/26/20      PT LONG TERM GOAL #5   Title Pt will ascend/descent 12 steps with both handrails with supervision only, to negotiate steps at home.    Baseline not attempted 05/31/20    Time 8     Period Weeks    Status On-going    Target Date 07/26/20                 Plan - 06/28/20 1725    Clinical Impression Statement Worked on strengthening, balance, gait today, with obstacles negotiation and turns.  Pt has one LOB with walker and turns with obstacle negotiation.  Also worked in parallel bars with progression from BUE to 1 UE support.  With SLS activities, RLE goes into hip/knee flexion with 1 UE support and with gait in parallel bars and 1 UE support, min assist needed for safety.  He will continue to benefit from skilled PT to progress towards goals for improved overall mobility.    Personal Factors and Comorbidities Comorbidity 3+    Comorbidities PMH:  multiple myeloma, ESRD on dialysis, DM type II, DVT with IVC filter, CVA, COPD, a-fib    Examination-Activity Limitations Locomotion Level;Transfers;Stairs;Stand    Examination-Participation Restrictions Community Activity    Stability/Clinical Decision Making Evolving/Moderate complexity    Rehab Potential Good    PT Frequency 2x / week    PT Duration 8 weeks   per recert 95/36/64  PT Treatment/Interventions ADLs/Self Care Home Management;DME Instruction;Neuromuscular re-education;Balance  training;Therapeutic exercise;Therapeutic activities;Functional mobility training;Stair training;Gait training;Patient/family education    PT Next Visit Plan Check STGs. Continue gait, obstacle negotiation, strength and balance work    PT Home Exercise Plan Access Code [email or web address]    [email or web address] and Agree with Plan of Care Patient;Family member/caregiver    Family Member Consulted mother           Patient will benefit from skilled therapeutic intervention in order to improve the following deficits and impairments:  Abnormal gait, Difficulty walking, Decreased safety awareness, Decreased balance, Decreased mobility, Decreased strength  Visit Diagnosis: Muscle weakness (generalized)  Unsteadiness on feet  Other abnormalities of gait  and mobility     Problem List Patient Active Problem List   Diagnosis Date Noted  . Colitis 03/15/2020  . AMS (altered mental status) 10/05/2019  . Acute metabolic encephalopathy 96/22/2979  . Headache, unspecified 09/28/2019  . Abnormality of gait 09/13/2019  . Diabetes mellitus with end-stage renal disease ([place]) 08/07/2019  . Benign hypertensive kidney disease with chronic kidney disease 08/07/2019  . Hypothyroidism, unspecified 07/15/2019  . Spastic hemiparesis ([place]) 07/12/2019  . Dysphagia, oropharyngeal phase 07/05/2019  . Allergy, unspecified, initial encounter 06/27/2019  . PAF (paroxysmal atrial fibrillation) ([place])   . Bacteremia   . Labile blood pressure   . Labile blood glucose   . Controlled type 2 diabetes mellitus with hyperglycemia, without long-term current use of insulin ([place])   . ESRD on dialysis ([place])   . Right sided weakness   . SDH (subdural hematoma) ([place])   . Chronic intracranial subdural hematoma ([facility]) 06/09/2019  . Multiple myeloma not having achieved remission ([place]) 06/09/2019  . ESRD on hemodialysis ([place]) 06/09/2019  . Essential hypertension 06/09/2019  . DM type 2 (diabetes mellitus, type 2) ([place]) 06/09/2019  . Seizure disorder ([place]) 06/09/2019  . Aspiration pneumonia ([place]) 06/09/2019  . History of bacteremia 06/09/2019  . Atrial fibrillation, chronic ([place]) 06/09/2019  . Bilateral kidney stones 05/31/2019  . Proteus infection 05/28/2019  . History of restrictive pulmonary disease 04/07/2019  . Multiple myeloma ([place]) 05/17/2018  . Goals of care, counseling/discussion 05/17/2018  . Partial idiopathic epilepsy with seizures of localized onset, intractable, without status epilepticus ([place]) 11/02/2017  . Iron deficiency anemia, unspecified 01/22/2017  . COPD (chronic obstructive pulmonary disease) ([place]) 11/11/2016  . CVA (cerebral vascular accident) ([place]) 11/11/2016  . GERD (gastroesophageal reflux disease) 11/11/2016  . GI bleed 11/11/2016  . IVC  (inferior vena cava obstruction) 11/11/2016  . Obesity (BMI 30-39.9) 11/11/2016  . Pulmonary hypertension ([place]) 11/11/2016  . Patent foramen ovale 11/11/2016  . Hyperparathyroidism, secondary renal ([place]) 11/11/2016  . VTE (venous thromboembolism) 11/11/2016  . Hypotension 11/11/2016  . Systemic hypertension 11/11/2016  . Seizure ([place]) 11/11/2016  . Other seizures ([place]) 07/25/2016  . Encounter for removal of sutures 05/27/2016  . Ingrown nail 03/03/2016  . Onychomycosis due to dermatophyte 03/03/2016  . Other acquired hammer toe 03/03/2016  . Hemiparesis affecting right side as late effect of cerebrovascular accident ([place]) 02/28/2016  . Aphasia complicating stroke 89/21/1941  . Seizures ([place]) 01/08/2016  . History of ischemic left MCA stroke 01/08/2016  . Right sided weakness 01/08/2016  . Atrial fibrillation ([place]) 01/08/2016  . Leukocytosis 01/08/2016  . Benign essential HTN 01/08/2016  . Anemia of chronic disease 01/08/2016  . Controlled type 2 diabetes mellitus with diabetic nephropathy, without long-term current use of insulin ([place]) 01/08/2016  . History of DVT (deep vein thrombosis) 01/08/2016  . 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 Hypocalcemia 12/26/2015  . Long  term (current) use of anticoagulants 11/07/2015  . Aftercare including intermittent dialysis ([place]) 11/02/2015  . Other specified coagulation defects ([place]) 11/02/2015  . Complication of vascular dialysis catheter 11/02/2015  . Diarrhea, unspecified 11/02/2015  . Fever, unspecified 11/02/2015  . Pruritus, unspecified 11/02/2015  . Shortness of breath 11/02/2015  . ESRD (end stage renal disease) on dialysis [facility])     Kellen Dutch W. 06/28/2020, 5:29 PM  Frazier Butt., PT   [facility] [address] [place], [place], [address] Phone: [phone]   Fax:  [phone]  Name: Isaiah [name] [facility] MRN: [id number] Date of Birth: 23-Jan-1966

## 2020-06-28 NOTE — Patient Instructions (Addendum)
Your Splint This splint should initially be fitted by a healthcare practitioner.  The healthcare practitioner is responsible for providing wearing instructions and precautions to the patient, other healthcare practitioners and care provider involved in the patient's care.  This splint was custom made for you. Please read the following instructions to learn about wearing and caring for your splint.  Precautions Should your splint cause any of the following problems, remove the splint immediately and contact your therapist/physician.  Swelling  Severe Pain  Pressure Areas  Stiffness  Numbness  Do not wear your splint while operating machinery unless it has been fabricated for that purpose.  When To Wear Your Splint Where your splint according to your therapist/physician instructions.  To start: ONLY wear for 2 hours at a time and off for 2 hours at a time, next day 3 hours on/2 hours off, next 4 hours on/2 hours off until reaching 5 hours of consistent wear. This is when you can move to all night wear.   Nights and rest periods only  Care and Cleaning of Your Splint 1. Keep your splint away from open flames. 2. Your splint will lose its shape in temperatures over 135 degrees Farenheit, ( in car windows, near radiators, ovens or in hot water).  Never make any adjustments to your splint, if the splint needs adjusting remove it and make an appointment to see your therapist. 3. Your splint, including the cushion liner may be cleaned with soap and lukewarm water.  Do not immerse in hot water over 135 degrees Farenheit. 4. Straps may be washed with soap and water, but do not moisten the self-adhesive portion. 5. For ink or hard to remove spots use a scouring cleanser which contains chlorine.  Rinse the splint thoroughly after using chlorine cleanser.   -------------------------------------------------------------------------------------------------  Functional Manipulation and right upper  extremity tasks  1. Flipping cards - start with jumbo or giant cards 2. Stacking plastic cups  3. Pick up cup and bring to mouth then place back on table 4. Use Right hand to wipe table 5. Eat finger foods with Right hand 6. Pick up plastic bottle caps/tops into bowl 7. Folding towels

## 2020-06-28 NOTE — Therapy (Signed)
[facility] [address] [place], [place], [address] Phone: [phone]   Fax:  [phone]  Occupational Therapy Treatment  Patient Details  Name: Isaiah [name] [name] MRN: [id number] Date of Birth: Aug 29, 1966 Referring Provider (OT): Posey Pronto, Ankit A.   Encounter Date: 06/28/2020   OT End of Session - 06/28/20 1528    Visit Number 4    Number of Visits 17    Date for OT Re-Evaluation 08/09/20    Authorization Type Medicare    Authorization Time Period OOP has been met. No financial responsibility.    Authorization - Visit Number 3    Authorization - Number of Visits 10    Progress Note Due on Visit 10    OT Start Time 6948    OT Stop Time 1530    OT Time Calculation (min) 42 min    Activity Tolerance Patient tolerated treatment well    Behavior During Therapy WFL for tasks assessed/performed           Past Medical History:  Diagnosis Date  . A-fib ([place])   . Anemia   . Asthma   . DM type 2 (diabetes mellitus, type 2) ([place]) 06/09/2019  . ESRD (end stage renal disease) on dialysis ([place])   . Essential hypertension 06/09/2019  . GIB (gastrointestinal bleeding)    Recurrent episodes- 09/2014, 09/2015 and 05/2016  . Gout   . History of recent blood transfusion 10/27/14   2 Units PRBC's  . Hyperkalemia 07/2011  . Multiple myeloma ([place])   . OSA on CPAP   . Pulmonary embolism ([place]) 07/2011; 09/27/2014   a. Bilat PE 07/2011 - unclear cause, tx with 6 months Coumadin.;   . Seizure disorder ([place]) 06/09/2019  . Sepsis ([place])   . Sickle cell-thalassemia disease ([place])    a. Sickle cell trait.  . Sleep apnea   . Stroke ([place]) 09/2015   R-MCA, L-MCA, PCA and bilateral cerebellar complicated by DVT/PE  . Subdural hematoma ([place]) 05/2019    Past Surgical History:  Procedure Laterality Date  . AV FISTULA PLACEMENT Right 12/05/2015   Procedure: INSERTION OF ARTERIOVENOUS (AV) GORE-TEX GRAFT ARM;  Surgeon: Elam Dutch, MD;   Location: [facility];  Service: Vascular;  Laterality: Right;  . BONE MARROW BIOPSY    . CATARACT EXTRACTION  08/2019  . CATARACT EXTRACTION  09/2019  . CHOLECYSTECTOMY    . CHOLECYSTECTOMY  1990's?  . COLONOSCOPY WITH PROPOFOL N/A 01/22/2017   Procedure: COLONOSCOPY WITH PROPOFOL;  Surgeon: Wilford Corner, MD;  Location: [facility];  Service: Endoscopy;  Laterality: N/A;  . EYE SURGERY Right   . INSERTION OF DIALYSIS CATHETER Right 10/16/2015   Procedure: INSERTION OF PALINDROME DIALYSIS CATHETER ;  Surgeon: Elam Dutch, MD;  Location: [facility];  Service: Vascular;  Laterality: Right;  . OTHER SURGICAL HISTORY     Retinal surgery  . PARS PLANA VITRECTOMY  02/17/2012   Procedure: PARS PLANA VITRECTOMY WITH 25 GAUGE;  Surgeon: Hayden Pedro, MD;  Location: [facility];  Service: Ophthalmology;  Laterality: Right;  . PERIPHERAL VASCULAR CATHETERIZATION N/A 03/20/2016   Procedure: A/V Shuntogram/Fistulagram;  Surgeon: Conrad Leonard, MD;  Location: [facility];  Service: Cardiovascular;  Laterality: N/A;  . TEE WITHOUT CARDIOVERSION N/A 12/02/2016   Procedure: TRANSESOPHAGEAL ECHOCARDIOGRAM (TEE);  Surgeon: Larey Dresser, MD;  Location: [facility];  Service: Cardiovascular;  Laterality: N/A;    There were no vitals filed for this visit.   Subjective Assessment -  06/28/20 1450    Subjective  Pt denies pain and reports no falls.    Patient is accompanied by: Family member   spouse   Pertinent History multiple myeloma (currently undergoing chemo treatment), DM, CVA, DVT, seizure disorder, SDH, ESRD on dialysis, and history of SDH x 2    Limitations Fall. No Driving. RUE fistula-dialysis; hx of seizures    Patient Stated Goals write better with his right hand, pick things up better with R hand    Currently in Pain? No/denies                        OT Treatments/Exercises (OP) - 06/28/20 1511      ADLs   Eating simulated drinking from a cup      Exercises   Exercises  Hand      Splinting   Splinting added straps and assessed fit of resting hand splint for patient's RUE. Issued wear schedule (see pt instructions) and precautions. Patient required mod verbal and visual cues for donning splint this day      Fine Motor Coordination (Hand/Wrist)   Fine Motor Coordination Flipping cards    Flipping cards jumbo cards glipping iwth RUE with increased time                   OT Education - 06/28/20 1507    Education Details Pt and mother educated on splint wear and schedule. functional RUE HEP - see pt instructions    Person(s) Educated Patient;Parent(s)    Methods Explanation;Demonstration    Comprehension Verbalized understanding;Returned demonstration;Need further instruction            OT Short Term Goals - 06/14/20 1803      OT SHORT TERM GOAL #1   Title Patient and caregiver will be independent with HEP 07/12/20    Time 4    Period Weeks    Status New    Target Date 07/12/20      OT SHORT TERM GOAL #2   Title Patient will perform UB dressing with min A in order to increase independence with ADLs    Time 4    Period Weeks    Status New      OT SHORT TERM GOAL #3   Title Patient will demonstrate improved function use of RUE as evidenced by increasing Box and Blocks score to 12 blocks or more.    Baseline RUE 9    Time 4    Period Weeks    Status New      OT SHORT TERM GOAL #4   Title Pt will improve RUE grip strength by at least 3 lbs or more in order to manage clothing with more independence with toileting    Time 4    Period Weeks    Status New      OT SHORT TERM GOAL #5   Title Pt will perform all aspects of toileting with min A consistently    Time 4    Period Weeks    Status New      OT SHORT TERM GOAL #6   Title Pt and spouse will report patient utilizing RUE for functional tasks 15% of the time in daily activities.    Time 4    Period Weeks    Status New             OT Long Term Goals - 06/14/20 1808       OT LONG TERM  GOAL #1   Title Pt will be independent with updated HEP 08/09/2020    Time 8    Period Weeks    Status New    Target Date 08/09/20      OT LONG TERM GOAL #2   Title Pt will improve RUE shoulder flexion to 110 degrees in order to obtain lightweight object from overhead cabinet.    Time 8    Period Weeks    Status New      OT LONG TERM GOAL #3   Title Patient will demonstrate improved function use of RUE as evidenced by increasing Box and Blocks score to 17 blocks or more.    Time 8    Period Weeks    Status New      OT LONG TERM GOAL #4   Title Patient will perform UB dressing with supervision with verbal cues in order to increase indepedence with ADLs.    Time 8    Period Weeks    Status New      OT LONG TERM GOAL #5   Title Pt will improve RUE grip strength by at least 6 lbs in order to manage clothing with more independence with toileting    Time 8    Period Weeks    Status New      OT LONG TERM GOAL #6   Title Pt will perform all aspects of toileting with distant supervision for safety in order to increase independence with ADLs.    Time 8    Period Weeks    Status New      OT LONG TERM GOAL #7   Title Pt and spouse will report patient utilizing RUE for functional tasks 25% of the time in daily activities.    Time 8    Period Weeks    Status New                  Patient will benefit from skilled therapeutic intervention in order to improve the following deficits and impairments:           Visit Diagnosis: Other lack of coordination  Muscle weakness (generalized)  Visuospatial deficit  Unsteadiness on feet  Frontal lobe and executive function deficit  Other abnormalities of gait and mobility  Stiffness of right wrist, not elsewhere classified    Problem List Patient Active Problem List   Diagnosis Date Noted  . Colitis 03/15/2020  . AMS (altered mental status) 10/05/2019  . Acute metabolic encephalopathy 75/17/0017  .  Headache, unspecified 09/28/2019  . Abnormality of gait 09/13/2019  . Diabetes mellitus with end-stage renal disease ([place]) 08/07/2019  . Benign hypertensive kidney disease with chronic kidney disease 08/07/2019  . Hypothyroidism, unspecified 07/15/2019  . Spastic hemiparesis ([place]) 07/12/2019  . Dysphagia, oropharyngeal phase 07/05/2019  . Allergy, unspecified, initial encounter 06/27/2019  . PAF (paroxysmal atrial fibrillation) ([place])   . Bacteremia   . Labile blood pressure   . Labile blood glucose   . Controlled type 2 diabetes mellitus with hyperglycemia, without long-term current use of insulin ([place])   . ESRD on dialysis ([place])   . Right sided weakness   . SDH (subdural hematoma) ([place])   . Chronic intracranial subdural hematoma ([facility]) 06/09/2019  . Multiple myeloma not having achieved remission ([place]) 06/09/2019  . ESRD on hemodialysis ([place]) 06/09/2019  . Essential hypertension 06/09/2019  . DM type 2 (diabetes mellitus, type 2) ([place]) 06/09/2019  . Seizure disorder ([place]) 06/09/2019  . Aspiration pneumonia ([place]) 06/09/2019  .  History of bacteremia 06/09/2019  . Atrial fibrillation, chronic ([place]) 06/09/2019  . Bilateral kidney stones 05/31/2019  . Proteus infection 05/28/2019  . History of restrictive pulmonary disease 04/07/2019  . Multiple myeloma ([place]) 05/17/2018  . Goals of care, counseling/discussion 05/17/2018  . Partial idiopathic epilepsy with seizures of localized onset, intractable, without status epilepticus ([place]) 11/02/2017  . Iron deficiency anemia, unspecified 01/22/2017  . COPD (chronic obstructive pulmonary disease) ([place]) 11/11/2016  . CVA (cerebral vascular accident) ([place]) 11/11/2016  . GERD (gastroesophageal reflux disease) 11/11/2016  . GI bleed 11/11/2016  . 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  . Obesity (BMI 30-39.9) 11/11/2016  . Pulmonary hypertension ([place]) 11/11/2016  . Patent foramen ovale 11/11/2016  . Hyperparathyroidism, secondary renal  ([place]) 11/11/2016  . VTE (venous thromboembolism) 11/11/2016  . Hypotension 11/11/2016  . Systemic hypertension 11/11/2016  . Seizure ([place]) 11/11/2016  . Other seizures ([place]) 07/25/2016  . Encounter for removal of sutures 05/27/2016  . Ingrown nail 03/03/2016  . Onychomycosis due to dermatophyte 03/03/2016  . Other acquired hammer toe 03/03/2016  . Hemiparesis affecting right side as late effect of cerebrovascular accident ([place]) 02/28/2016  . Aphasia complicating stroke 17/91/5056  . Seizures ([place]) 01/08/2016  . History of ischemic left MCA stroke 01/08/2016  . Right sided weakness 01/08/2016  . Atrial fibrillation ([place]) 01/08/2016  . Leukocytosis 01/08/2016  . Benign essential HTN 01/08/2016  . Anemia of chronic disease 01/08/2016  . Controlled type 2 diabetes mellitus with diabetic nephropathy, without long-term current use of insulin ([place]) 01/08/2016  . History of DVT (deep vein thrombosis) 01/08/2016  . 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 Hypocalcemia 12/26/2015  . Long term (current) use of anticoagulants 11/07/2015  . Aftercare including intermittent dialysis ([place]) 11/02/2015  . Other specified coagulation defects ([place]) 11/02/2015  . Complication of vascular dialysis catheter 11/02/2015  . Diarrhea, unspecified 11/02/2015  . Fever, unspecified 11/02/2015  . Pruritus, unspecified 11/02/2015  . Shortness of breath 11/02/2015  . ESRD (end stage renal disease) on dialysis [facility])     Zachery Conch 06/28/2020, 6:13 PM  [facility] [address] [place], [place], [address] Phone: [phone]   Fax:  [phone]  Name: Isaiah [name] [facility] MRN: [id number] Date of Birth: Jun 19, 1966

## 2020-06-29 DIAGNOSIS — N186 End stage renal disease: Secondary | ICD-10-CM | POA: Diagnosis not present

## 2020-06-29 DIAGNOSIS — Z992 Dependence on renal dialysis: Secondary | ICD-10-CM | POA: Diagnosis not present

## 2020-06-29 DIAGNOSIS — E1129 Type 2 diabetes mellitus with other diabetic kidney complication: Secondary | ICD-10-CM | POA: Diagnosis not present

## 2020-06-29 DIAGNOSIS — D631 Anemia in chronic kidney disease: Secondary | ICD-10-CM | POA: Diagnosis not present

## 2020-06-29 DIAGNOSIS — N2581 Secondary hyperparathyroidism of renal origin: Secondary | ICD-10-CM | POA: Diagnosis not present

## 2020-06-30 DIAGNOSIS — E109 Type 1 diabetes mellitus without complications: Secondary | ICD-10-CM | POA: Diagnosis not present

## 2020-07-02 ENCOUNTER — Encounter: Payer: Self-pay | Admitting: Neurology

## 2020-07-02 DIAGNOSIS — D631 Anemia in chronic kidney disease: Secondary | ICD-10-CM | POA: Diagnosis not present

## 2020-07-02 DIAGNOSIS — N186 End stage renal disease: Secondary | ICD-10-CM | POA: Diagnosis not present

## 2020-07-02 DIAGNOSIS — E1129 Type 2 diabetes mellitus with other diabetic kidney complication: Secondary | ICD-10-CM | POA: Diagnosis not present

## 2020-07-02 DIAGNOSIS — Z992 Dependence on renal dialysis: Secondary | ICD-10-CM | POA: Diagnosis not present

## 2020-07-02 DIAGNOSIS — N2581 Secondary hyperparathyroidism of renal origin: Secondary | ICD-10-CM | POA: Diagnosis not present

## 2020-07-02 NOTE — Progress Notes (Signed)
Chibueze Lanzo (Key: [id number]) Vimpat 200MG tablets   Form MedImpact ePA Form 2017 NCPDP Created 4 days ago Sent to Plan 4 days ago Plan Response 4 days ago Submit Clinical Questions 4 days ago Determination Favorable 1 day ago Message from Plan The request has been approved. The authorization is effective for a maximum of 12 fills from 07/11/2020 to 07/10/2021, as long as the member is enrolled in their current health plan. The request was reviewed and approved by a licensed clinical pharmacist. This request is approved for 3 tablets per day. A written notification letter will follow with additional details.

## 2020-07-03 ENCOUNTER — Ambulatory Visit: Payer: Medicare Other | Admitting: Physical Therapy

## 2020-07-03 ENCOUNTER — Other Ambulatory Visit: Payer: Self-pay

## 2020-07-03 ENCOUNTER — Ambulatory Visit: Payer: Medicare Other | Admitting: Occupational Therapy

## 2020-07-03 DIAGNOSIS — M6281 Muscle weakness (generalized): Secondary | ICD-10-CM | POA: Diagnosis not present

## 2020-07-03 DIAGNOSIS — R41844 Frontal lobe and executive function deficit: Secondary | ICD-10-CM | POA: Diagnosis not present

## 2020-07-03 DIAGNOSIS — M25631 Stiffness of right wrist, not elsewhere classified: Secondary | ICD-10-CM | POA: Diagnosis not present

## 2020-07-03 DIAGNOSIS — R2689 Other abnormalities of gait and mobility: Secondary | ICD-10-CM | POA: Diagnosis not present

## 2020-07-03 DIAGNOSIS — R41842 Visuospatial deficit: Secondary | ICD-10-CM | POA: Diagnosis not present

## 2020-07-03 DIAGNOSIS — S065X9A Traumatic subdural hemorrhage with loss of consciousness of unspecified duration, initial encounter: Secondary | ICD-10-CM | POA: Diagnosis not present

## 2020-07-03 DIAGNOSIS — R2681 Unsteadiness on feet: Secondary | ICD-10-CM | POA: Diagnosis not present

## 2020-07-03 DIAGNOSIS — R41840 Attention and concentration deficit: Secondary | ICD-10-CM | POA: Diagnosis not present

## 2020-07-03 DIAGNOSIS — R278 Other lack of coordination: Secondary | ICD-10-CM | POA: Diagnosis not present

## 2020-07-03 NOTE — Therapy (Signed)
[place] [address] [place], [place], [address] Phone: [phone]   Fax:  [phone]  Physical Therapy Treatment  Patient Details  Name: Jaquin [name] MRN: [id number] Date of Birth: [birth year] Referring Provider (PT): Delice Lesch, MD   Encounter Date: 07/03/2020   PT End of Session - 07/03/20 1422    Visit Number 17    Number of Visits 27   per recent recert wk of 2/63   Date for PT Re-Evaluation 08/30/20    Authorization Type Medicare; PN/Recert 3/35/45    Progress Note Due on Visit 20    PT Start Time 1233    PT Stop Time 1315    PT Time Calculation (min) 42 min    Equipment Utilized During Treatment Gait belt    Activity Tolerance Patient tolerated treatment well    Behavior During Therapy [facility] for tasks assessed/performed           Past Medical History:  Diagnosis Date  . A-fib ([place])   . Anemia   . Asthma   . DM type 2 (diabetes mellitus, type 2) ([place]) 06/09/2019  . ESRD (end stage renal disease) on dialysis ([place])   . Essential hypertension 06/09/2019  . GIB (gastrointestinal bleeding)    Recurrent episodes- 09/2014, 09/2015 and 05/2016  . Gout   . History of recent blood transfusion 10/27/14   2 Units PRBC's  . Hyperkalemia 07/2011  . Multiple myeloma ([place])   . OSA on CPAP   . Pulmonary embolism ([place]) 07/2011; 09/27/2014   a. Bilat PE 07/2011 - unclear cause, tx with 6 months Coumadin.;   . Seizure disorder ([place]) 06/09/2019  . Sepsis ([place])   . Sickle cell-thalassemia disease ([place])    a. Sickle cell trait.  . Sleep apnea   . Stroke ([place]) 09/2015   R-MCA, L-MCA, PCA and bilateral cerebellar complicated by DVT/PE  . Subdural hematoma ([place]) 05/2019    Past Surgical History:  Procedure Laterality Date  . AV FISTULA PLACEMENT Right 12/05/2015   Procedure: INSERTION OF ARTERIOVENOUS (AV) GORE-TEX GRAFT ARM;  Surgeon: Elam Dutch, MD;  Location: [place];  Service: Vascular;  Laterality: Right;   . BONE MARROW BIOPSY    . CATARACT EXTRACTION  08/2019  . CATARACT EXTRACTION  09/2019  . CHOLECYSTECTOMY    . CHOLECYSTECTOMY  1990's?  . COLONOSCOPY WITH PROPOFOL N/A 01/22/2017   Procedure: COLONOSCOPY WITH PROPOFOL;  Surgeon: Wilford Corner, MD;  Location: [facility];  Service: Endoscopy;  Laterality: N/A;  . EYE SURGERY Right   . INSERTION OF DIALYSIS CATHETER Right 10/16/2015   Procedure: INSERTION OF PALINDROME DIALYSIS CATHETER ;  Surgeon: Elam Dutch, MD;  Location: [place];  Service: Vascular;  Laterality: Right;  . OTHER SURGICAL HISTORY     Retinal surgery  . PARS PLANA VITRECTOMY  02/17/2012   Procedure: PARS PLANA VITRECTOMY WITH 25 GAUGE;  Surgeon: Hayden Pedro, MD;  Location: [facility];  Service: Ophthalmology;  Laterality: Right;  . PERIPHERAL VASCULAR CATHETERIZATION N/A 03/20/2016   Procedure: A/V Shuntogram/Fistulagram;  Surgeon: Conrad [place], MD;  Location: [facility];  Service: Cardiovascular;  Laterality: N/A;  . TEE WITHOUT CARDIOVERSION N/A 12/02/2016   Procedure: TRANSESOPHAGEAL ECHOCARDIOGRAM (TEE);  Surgeon: Larey Dresser, MD;  Location: [facility];  Service: Cardiovascular;  Laterality: N/A;    There were no vitals filed for this visit.   Subjective Assessment - 07/03/20 1236    Subjective Have blank seizures sometimes and  that happened yesterday.  It caused me to fall and I hit my head.  Had a hard time getting up from the floor.    Patient is accompained by: Family member   wife, provides history   Pertinent History multiple myeloma, ESRD on dialysis, DM type II, DVT with IVC filter, CVA, COPD, a-fib; chemo treatments down to 1x/month (next is August 17th)    Patient Stated Goals To get back to walk without the walker, back to his baseline    Currently in Pain? No/denies                             [facility] Adult PT Treatment/Exercise - 07/03/20 0001      Transfers   Transfers Sit to Stand;Stand to Sit    Sit to Stand 5:  Supervision;4: Min guard    Sit to Stand Details Verbal cues for technique;Verbal cues for sequencing    Sit to Stand Details (indicate cue type and reason) Cues for hand placement    Stand to Sit 4: Min guard;With upper extremity assist;To bed;To chair/3-in-1    Stand to Sit Details (indicate cue type and reason) Verbal cues for sequencing;Verbal cues for technique    Comments At least 5 reps sit<>stand throughout session      Ambulation/Gait   Ambulation/Gait Yes    Ambulation/Gait Assistance 4: Min guard    Ambulation Distance (Feet) 100 Feet   40 ft x 2; 20 ft x 2    Assistive device Rolling walker    Gait Pattern Step-through pattern;Trunk flexed;Poor foot clearance - left;Poor foot clearance - right    Ambulation Surface Level;Indoor    Gait velocity 23.32 sec = 1.41 ft/sec      Knee/Hip Exercises: Aerobic   Stepper Seated SciFit Stepper, Level 3, BLE only x 8 minutes. Performed for improved lower extremity strength and endurance.  Pt keeps RPM >55-60 for duration of time.               Balance Exercises - 07/03/20 0001      Balance Exercises: Standing   SLS with Vectors Solid surface;Other reps (comment);Limitations;Upper extremity assist 1    SLS with Vectors Limitations Alternating step taps to 6" step x 10 reps, then to 12" step x 10 reps, 1 UE support, min guard assist.    Standing, One Foot on a Step Eyes open;Limitations;3 reps;6 inch    Standing, One Foot on a Step Limitations With foot propped head turns, head nods x 5 reps, 2 sets.  Also alternating UE lifts x 5 reps.  Cues for taller stance through RLE.    Other Standing Exercises Using 2" black obstacle, forward step over and return to midline, x 10 reps, BUE support, then alternating step taps over obstacles, with 1 UE support.  Side step over obstacle x 10 reps with BUE support    Other Standing Exercises Comments Forward walk in parallel bars, with BUE support x 4 reps, then LUE support only, x 6 reps, min  assist and cues for step length, widened BOS               PT Short Term Goals - 07/03/20 1239      PT SHORT TERM GOAL #1   Title Pt will perform HEP independenlty for improved strength, balance and gait.    Baseline performs, but needs supervision for safety in standing    Time 4    Period  Weeks    Status Partially Met    Target Date 06/28/20      PT SHORT TERM GOAL #2   Title Pt will improve gait velocity to at least 1.3 ft/sec for improved gait efficiency and safety.    Baseline 1.93f/s with RW 05/31/20; 1.46 ft/sec 07/03/2020    Time 4    Period Weeks    Status Achieved    Target Date 06/28/20             PT Long Term Goals - 06/05/20 2150      PT LONG TERM GOAL #1   Title Pt will be independent with progression of HEP for improved strength, blaance, gait.    Time 8    Period Weeks    Status On-going    Target Date 07/26/20      PT LONG TERM GOAL #2   Title Pt will imrpove Berg Balance score to at least 33/56 for decreased fall risk.    Baseline 30/56 05/31/20    Time 8    Period Weeks    Status On-going    Target Date 07/26/20      PT LONG TERM GOAL #3   Title Pt will improve TUG score to less than or equal to 30 seconds for decreased fall risk.    Baseline 37 sec 05/31/20    Time 8    Period Weeks    Status On-going    Target Date 07/26/20      PT LONG TERM GOAL #4   Title Pt will improve gait velocity to at least 1.8 ft/sec for improved gait velocity and decreased fall risk.    Baseline 1.12 ft/s 05/31/20    Time 8    Period Weeks    Status On-going    Target Date 07/26/20      PT LONG TERM GOAL #5   Title Pt will ascend/descent 12 steps with both handrails with supervision only, to negotiate steps at home.    Baseline not attempted 05/31/20    Time 8    Period Weeks    Status On-going    Target Date 07/26/20                 Plan - 07/03/20 1423    Clinical Impression Statement Assessed STGs this visit, with pt partially meeting  STG 1; STG 2 met for improved gait velocity, to 1.41 ft/sec.  Focused on standing balance exercises, with pt continuing to require min assist when going down to 1 UE support.  He will continue to benefit from skilled PT to progress towards goals for improved overall mobility.    Personal Factors and Comorbidities Comorbidity 3+    Comorbidities PMH:  multiple myeloma, ESRD on dialysis, DM type II, DVT with IVC filter, CVA, COPD, a-fib    Examination-Activity Limitations Locomotion Level;Transfers;Stairs;Stand    Examination-Participation Restrictions Community Activity    Stability/Clinical Decision Making Evolving/Moderate complexity    Rehab Potential Good    PT Frequency 2x / week    PT Duration 8 weeks   per recert 94/19/37  PT Treatment/Interventions ADLs/Self Care Home Management;DME Instruction;Neuromuscular re-education;Balance training;Therapeutic exercise;Therapeutic activities;Functional mobility training;Stair training;Gait training;Patient/family education    PT Next Visit Plan Continue gait, obstacle negotiation, strength and standing balance work (this is wk 4 of 8-pt is scheduled out farther?)    PT Home Exercise Plan Access Code [email or web address]   Consulted and Agree with Plan of Care Patient;Family member/caregiver    Family  Member Consulted mother           Patient will benefit from skilled therapeutic intervention in order to improve the following deficits and impairments:  Abnormal gait, Difficulty walking, Decreased safety awareness, Decreased balance, Decreased mobility, Decreased strength  Visit Diagnosis: Other abnormalities of gait and mobility  Unsteadiness on feet  Muscle weakness (generalized)     Problem List Patient Active Problem List   Diagnosis Date Noted  . Colitis 03/15/2020  . AMS (altered mental status) 10/05/2019  . Acute metabolic encephalopathy 06/14/1218  . Headache, unspecified 09/28/2019  . Abnormality of gait 09/13/2019  . Diabetes mellitus  with end-stage renal disease ([place]) 08/07/2019  . Benign hypertensive kidney disease with chronic kidney disease 08/07/2019  . Hypothyroidism, unspecified 07/15/2019  . Spastic hemiparesis ([place]) 07/12/2019  . Dysphagia, oropharyngeal phase 07/05/2019  . Allergy, unspecified, initial encounter 06/27/2019  . PAF (paroxysmal atrial fibrillation) ([place])   . Bacteremia   . Labile blood pressure   . Labile blood glucose   . Controlled type 2 diabetes mellitus with hyperglycemia, without long-term current use of insulin ([place])   . ESRD on dialysis ([place])   . Right sided weakness   . SDH (subdural hematoma) ([place])   . Chronic intracranial subdural hematoma ([facility]) 06/09/2019  . Multiple myeloma not having achieved remission ([place]) 06/09/2019  . ESRD on hemodialysis ([place]) 06/09/2019  . Essential hypertension 06/09/2019  . DM type 2 (diabetes mellitus, type 2) ([place]) 06/09/2019  . Seizure disorder ([place]) 06/09/2019  . Aspiration pneumonia ([place]) 06/09/2019  . History of bacteremia 06/09/2019  . Atrial fibrillation, chronic ([place]) 06/09/2019  . Bilateral kidney stones 05/31/2019  . Proteus infection 05/28/2019  . History of restrictive pulmonary disease 04/07/2019  . Multiple myeloma ([place]) 05/17/2018  . Goals of care, counseling/discussion 05/17/2018  . Partial idiopathic epilepsy with seizures of localized onset, intractable, without status epilepticus ([place]) 11/02/2017  . Iron deficiency anemia, unspecified 01/22/2017  . COPD (chronic obstructive pulmonary disease) ([place]) 11/11/2016  . CVA (cerebral vascular accident) ([place]) 11/11/2016  . GERD (gastroesophageal reflux disease) 11/11/2016  . GI bleed 11/11/2016  . 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  . Obesity (BMI 30-39.9) 11/11/2016  . Pulmonary hypertension ([place]) 11/11/2016  . Patent foramen ovale 11/11/2016  . Hyperparathyroidism, secondary renal ([place]) 11/11/2016  . VTE (venous thromboembolism) 11/11/2016  . Hypotension 11/11/2016  .  Systemic hypertension 11/11/2016  . Seizure ([place]) 11/11/2016  . Other seizures ([place]) 07/25/2016  . Encounter for removal of sutures 05/27/2016  . Ingrown nail 03/03/2016  . Onychomycosis due to dermatophyte 03/03/2016  . Other acquired hammer toe 03/03/2016  . Hemiparesis affecting right side as late effect of cerebrovascular accident ([place]) 02/28/2016  . Aphasia complicating stroke 75/88/3254  . Seizures ([place]) 01/08/2016  . History of ischemic left MCA stroke 01/08/2016  . Right sided weakness 01/08/2016  . Atrial fibrillation ([place]) 01/08/2016  . Leukocytosis 01/08/2016  . Benign essential HTN 01/08/2016  . Anemia of chronic disease 01/08/2016  . Controlled type 2 diabetes mellitus with diabetic nephropathy, without long-term current use of insulin ([place]) 01/08/2016  . History of DVT (deep vein thrombosis) 01/08/2016  . 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 Hypocalcemia 12/26/2015  . Long term (current) use of anticoagulants 11/07/2015  . Aftercare including intermittent dialysis ([place]) 11/02/2015  . Other specified coagulation defects ([place]) 11/02/2015  . Complication of vascular dialysis catheter 11/02/2015  . Diarrhea, unspecified 11/02/2015  . Fever, unspecified 11/02/2015  . Pruritus, unspecified 11/02/2015  . Shortness of breath 11/02/2015  .  ESRD (end stage renal disease) on dialysis [facility])     Kaveh Kissinger W. 07/03/2020, 2:29 PM  Frazier Butt., PT   [place] [address] [place], [place], [address] Phone: [phone]   Fax:  [phone]  Name: [place] Crossen [facility] MRN: [id number] Date of Birth: Feb 10, 1966

## 2020-07-03 NOTE — Therapy (Signed)
[facility] [address] [place], [place], [address] Phone: [phone]   Fax:  [phone]  Occupational Therapy Treatment  Patient Details  Name: Isaiah [name] [facility] MRN: [id number] Date of Birth: 30-Dec-1965 Referring Provider (OT): Posey Pronto, Ankit A.   Encounter Date: 07/03/2020   OT End of Session - 07/03/20 1318    Visit Number 5    Number of Visits 17    Date for OT Re-Evaluation 08/09/20    Authorization Type Medicare    Authorization Time Period OOP has been met. No financial responsibility.    Authorization - Visit Number 5    Authorization - Number of Visits 10    Progress Note Due on Visit 10    OT Start Time 1318    OT Stop Time 1400    OT Time Calculation (min) 42 min    Activity Tolerance Patient tolerated treatment well    Behavior During Therapy WFL for tasks assessed/performed           Past Medical History:  Diagnosis Date  . A-fib ([place])   . Anemia   . Asthma   . DM type 2 (diabetes mellitus, type 2) ([place]) 06/09/2019  . ESRD (end stage renal disease) on dialysis ([place])   . Essential hypertension 06/09/2019  . GIB (gastrointestinal bleeding)    Recurrent episodes- 09/2014, 09/2015 and 05/2016  . Gout   . History of recent blood transfusion 10/27/14   2 Units PRBC's  . Hyperkalemia 07/2011  . Multiple myeloma ([place])   . OSA on CPAP   . Pulmonary embolism ([place]) 07/2011; 09/27/2014   a. Bilat PE 07/2011 - unclear cause, tx with 6 months Coumadin.;   . Seizure disorder ([place]) 06/09/2019  . Sepsis ([place])   . Sickle cell-thalassemia disease ([place])    a. Sickle cell trait.  . Sleep apnea   . Stroke ([place]) 09/2015   R-MCA, L-MCA, PCA and bilateral cerebellar complicated by DVT/PE  . Subdural hematoma ([place]) 05/2019    Past Surgical History:  Procedure Laterality Date  . AV FISTULA PLACEMENT Right 12/05/2015   Procedure: INSERTION OF ARTERIOVENOUS (AV) GORE-TEX GRAFT ARM;  Surgeon: Elam Dutch, MD;   Location: [facility];  Service: Vascular;  Laterality: Right;  . BONE MARROW BIOPSY    . CATARACT EXTRACTION  08/2019  . CATARACT EXTRACTION  09/2019  . CHOLECYSTECTOMY    . CHOLECYSTECTOMY  1990's?  . COLONOSCOPY WITH PROPOFOL N/A 01/22/2017   Procedure: COLONOSCOPY WITH PROPOFOL;  Surgeon: Wilford Corner, MD;  Location: [facility];  Service: Endoscopy;  Laterality: N/A;  . EYE SURGERY Right   . INSERTION OF DIALYSIS CATHETER Right 10/16/2015   Procedure: INSERTION OF PALINDROME DIALYSIS CATHETER ;  Surgeon: Elam Dutch, MD;  Location: [place] [facility];  Service: Vascular;  Laterality: Right;  . OTHER SURGICAL HISTORY     Retinal surgery  . PARS PLANA VITRECTOMY  02/17/2012   Procedure: PARS PLANA VITRECTOMY WITH 25 GAUGE;  Surgeon: Hayden Pedro, MD;  Location: [place] [facility];  Service: Ophthalmology;  Laterality: Right;  . PERIPHERAL VASCULAR CATHETERIZATION N/A 03/20/2016   Procedure: A/V Shuntogram/Fistulagram;  Surgeon: Conrad Spickard, MD;  Location: [facility];  Service: Cardiovascular;  Laterality: N/A;  . TEE WITHOUT CARDIOVERSION N/A 12/02/2016   Procedure: TRANSESOPHAGEAL ECHOCARDIOGRAM (TEE);  Surgeon: Larey Dresser, MD;  Location: [facility];  Service: Cardiovascular;  Laterality: N/A;    There were no vitals filed for this visit.   Subjective Assessment -  07/03/20 1336    Subjective  pt denies any pain but reports  fall yesterday s/p seizure    Patient is accompanied by: Family member   spouse   Pertinent History multiple myeloma (currently undergoing chemo treatment), DM, CVA, DVT, seizure disorder, SDH, ESRD on dialysis, and history of SDH x 2    Limitations Fall. No Driving. RUE fistula-dialysis; hx of seizures    Patient Stated Goals write better with his right hand, pick things up better with R hand    Currently in Pain? No/denies                        OT Treatments/Exercises (OP) - 07/03/20 1349      ADLs   UB Dressing mod A and assistance for orientation  and for verbal cues for hemi techniques. pt able to done sweatshirt with sup    LB Dressing Min A for stability and vebal cues for technique       Neurological Re-education Exercises   Shoulder Flexion AROM;Both;10 reps;Seated    Wrist Extension PROM;AAROM;Right;Seated   with weight bearing on mat     Splinting   Splinting reviewed donning and doffing splint. Pt reports no redness or pain with wear schedule. Encouraged to increase from 2 hours to 3 hour segments, etc.                     OT Short Term Goals - 06/14/20 1803      OT SHORT TERM GOAL #1   Title Patient and caregiver will be independent with HEP 07/12/20    Time 4    Period Weeks    Status New    Target Date 07/12/20      OT SHORT TERM GOAL #2   Title Patient will perform UB dressing with min A in order to increase independence with ADLs    Time 4    Period Weeks    Status New      OT SHORT TERM GOAL #3   Title Patient will demonstrate improved function use of RUE as evidenced by increasing Box and Blocks score to 12 blocks or more.    Baseline RUE 9    Time 4    Period Weeks    Status New      OT SHORT TERM GOAL #4   Title Pt will improve RUE grip strength by at least 3 lbs or more in order to manage clothing with more independence with toileting    Time 4    Period Weeks    Status New      OT SHORT TERM GOAL #5   Title Pt will perform all aspects of toileting with min A consistently    Time 4    Period Weeks    Status New      OT SHORT TERM GOAL #6   Title Pt and spouse will report patient utilizing RUE for functional tasks 15% of the time in daily activities.    Time 4    Period Weeks    Status New             OT Long Term Goals - 06/14/20 1808      OT LONG TERM GOAL #1   Title Pt will be independent with updated HEP 08/09/2020    Time 8    Period Weeks    Status New    Target Date 08/09/20      OT LONG  TERM GOAL #2   Title Pt will improve RUE shoulder flexion to 110 degrees in  order to obtain lightweight object from overhead cabinet.    Time 8    Period Weeks    Status New      OT LONG TERM GOAL #3   Title Patient will demonstrate improved function use of RUE as evidenced by increasing Box and Blocks score to 17 blocks or more.    Time 8    Period Weeks    Status New      OT LONG TERM GOAL #4   Title Patient will perform UB dressing with supervision with verbal cues in order to increase indepedence with ADLs.    Time 8    Period Weeks    Status New      OT LONG TERM GOAL #5   Title Pt will improve RUE grip strength by at least 6 lbs in order to manage clothing with more independence with toileting    Time 8    Period Weeks    Status New      OT LONG TERM GOAL #6   Title Pt will perform all aspects of toileting with distant supervision for safety in order to increase independence with ADLs.    Time 8    Period Weeks    Status New      OT LONG TERM GOAL #7   Title Pt and spouse will report patient utilizing RUE for functional tasks 25% of the time in daily activities.    Time 8    Period Weeks    Status New                 Plan - 07/03/20 1422    Clinical Impression Statement Pt progressing towards goals. Pt with difficulty following verbal commands and decreased visuospatial awareness. Pt with decrease self awareness into deficits as he reports independence with ADLs but demonstrates needing assistance and mom reports that he requires assistance.    OT Occupational Profile and History Detailed Assessment- Review of Records and additional review of physical, cognitive, psychosocial history related to current functional performance    Occupational performance deficits (Please refer to evaluation for details): IADL's;ADL's;Leisure;Social Participation    Body Structure / Function / Physical Skills ADL;Decreased knowledge of use of DME;Gait;Strength;Tone;GMC;Dexterity;Balance;Body mechanics;Proprioception;UE functional  use;ROM;IADL;Coordination;FMC;Vision;Flexibility;Mobility    Cognitive Skills Attention;Problem Solve;Safety Awareness;Sequencing;Energy/Drive;Thought;Learn;Memory;Understand;Perception    Rehab Potential Good    Clinical Decision Making Limited treatment options, no task modification necessary    Comorbidities Affecting Occupational Performance: May have comorbidities impacting occupational performance    Modification or Assistance to Complete Evaluation  No modification of tasks or assist necessary to complete eval    OT Frequency 2x / week    OT Duration 8 weeks    OT Treatment/Interventions Self-care/ADL training;Moist Heat;Fluidtherapy;DME and/or AE instruction;Balance training;Gait Training;Therapeutic activities;Cognitive remediation/compensation;Therapeutic exercise;Ultrasound;Neuromuscular education;Passive range of motion;Visual/perceptual remediation/compensation;Functional Mobility Training;Electrical Stimulation;Energy conservation;Manual Therapy;Patient/family education    Plan initial HEP with supine BUE sh flexion, simple coordination and ADL tasks w/ RUE    Recommended Other Services Recommended ST referral - spouse asked to wait.    Consulted and Agree with Plan of Care Patient;Family member/caregiver    Family Member Consulted spouse           Patient will benefit from skilled therapeutic intervention in order to improve the following deficits and impairments:   Body Structure / Function / Physical Skills: ADL, Decreased knowledge of use of DME, Gait, Strength, Tone, GMC, Dexterity,  Balance, Body mechanics, Proprioception, UE functional use, ROM, IADL, Coordination, FMC, Vision, Flexibility, Mobility Cognitive Skills: Attention, Problem Solve, Safety Awareness, Sequencing, Energy/Drive, Thought, Learn, Memory, Understand, Perception     Visit Diagnosis: Other lack of coordination  Visuospatial deficit  Frontal lobe and executive function deficit  Stiffness of right  wrist, not elsewhere classified    Problem List Patient Active Problem List   Diagnosis Date Noted  . Colitis 03/15/2020  . AMS (altered mental status) 10/05/2019  . Acute metabolic encephalopathy 46/27/0350  . Headache, unspecified 09/28/2019  . Abnormality of gait 09/13/2019  . Diabetes mellitus with end-stage renal disease ([place]) 08/07/2019  . Benign hypertensive kidney disease with chronic kidney disease 08/07/2019  . Hypothyroidism, unspecified 07/15/2019  . Spastic hemiparesis ([place]) 07/12/2019  . Dysphagia, oropharyngeal phase 07/05/2019  . Allergy, unspecified, initial encounter 06/27/2019  . PAF (paroxysmal atrial fibrillation) ([place])   . Bacteremia   . Labile blood pressure   . Labile blood glucose   . Controlled type 2 diabetes mellitus with hyperglycemia, without long-term current use of insulin ([place])   . ESRD on dialysis ([place])   . Right sided weakness   . SDH (subdural hematoma) ([place])   . Chronic intracranial subdural hematoma ([facility]) 06/09/2019  . Multiple myeloma not having achieved remission ([place]) 06/09/2019  . ESRD on hemodialysis ([place]) 06/09/2019  . Essential hypertension 06/09/2019  . DM type 2 (diabetes mellitus, type 2) ([place]) 06/09/2019  . Seizure disorder ([place]) 06/09/2019  . Aspiration pneumonia ([place]) 06/09/2019  . History of bacteremia 06/09/2019  . Atrial fibrillation, chronic ([place]) 06/09/2019  . Bilateral kidney stones 05/31/2019  . Proteus infection 05/28/2019  . History of restrictive pulmonary disease 04/07/2019  . Multiple myeloma ([place]) 05/17/2018  . Goals of care, counseling/discussion 05/17/2018  . Partial idiopathic epilepsy with seizures of localized onset, intractable, without status epilepticus ([place]) 11/02/2017  . Iron deficiency anemia, unspecified 01/22/2017  . COPD (chronic obstructive pulmonary disease) ([place]) 11/11/2016  . CVA (cerebral vascular accident) ([place]) 11/11/2016  . GERD (gastroesophageal reflux disease) 11/11/2016  . GI bleed  11/11/2016  . 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  . Obesity (BMI 30-39.9) 11/11/2016  . Pulmonary hypertension ([place]) 11/11/2016  . Patent foramen ovale 11/11/2016  . Hyperparathyroidism, secondary renal ([place]) 11/11/2016  . VTE (venous thromboembolism) 11/11/2016  . Hypotension 11/11/2016  . Systemic hypertension 11/11/2016  . Seizure ([place]) 11/11/2016  . Other seizures ([place]) 07/25/2016  . Encounter for removal of sutures 05/27/2016  . Ingrown nail 03/03/2016  . Onychomycosis due to dermatophyte 03/03/2016  . Other acquired hammer toe 03/03/2016  . Hemiparesis affecting right side as late effect of cerebrovascular accident ([place]) 02/28/2016  . Aphasia complicating stroke 09/38/1829  . Seizures ([place]) 01/08/2016  . History of ischemic left MCA stroke 01/08/2016  . Right sided weakness 01/08/2016  . Atrial fibrillation ([place]) 01/08/2016  . Leukocytosis 01/08/2016  . Benign essential HTN 01/08/2016  . Anemia of chronic disease 01/08/2016  . Controlled type 2 diabetes mellitus with diabetic nephropathy, without long-term current use of insulin ([place]) 01/08/2016  . History of DVT (deep vein thrombosis) 01/08/2016  . 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 Hypocalcemia 12/26/2015  . Long term (current) use of anticoagulants 11/07/2015  . Aftercare including intermittent dialysis ([place]) 11/02/2015  . Other specified coagulation defects ([place]) 11/02/2015  . Complication of vascular dialysis catheter 11/02/2015  . Diarrhea, unspecified 11/02/2015  . Fever, unspecified 11/02/2015  . Pruritus, unspecified 11/02/2015  . Shortness of breath 11/02/2015  . ESRD (end stage  renal disease) on dialysis [name])     Zachery Conch MOT, OTR/L  07/03/2020, 2:25 PM  [facility] [address] [place], [place], [address] Phone: [phone]   Fax:  [phone]  Name: Aarin Bluett [facility] MRN: [id number] Date of Birth: [birth year]

## 2020-07-04 ENCOUNTER — Telehealth: Payer: Medicare Other

## 2020-07-04 DIAGNOSIS — D631 Anemia in chronic kidney disease: Secondary | ICD-10-CM | POA: Diagnosis not present

## 2020-07-04 DIAGNOSIS — N2581 Secondary hyperparathyroidism of renal origin: Secondary | ICD-10-CM | POA: Diagnosis not present

## 2020-07-04 DIAGNOSIS — E1129 Type 2 diabetes mellitus with other diabetic kidney complication: Secondary | ICD-10-CM | POA: Diagnosis not present

## 2020-07-04 DIAGNOSIS — Z992 Dependence on renal dialysis: Secondary | ICD-10-CM | POA: Diagnosis not present

## 2020-07-04 DIAGNOSIS — N186 End stage renal disease: Secondary | ICD-10-CM | POA: Diagnosis not present

## 2020-07-05 ENCOUNTER — Ambulatory Visit: Payer: Medicare Other | Admitting: Physical Therapy

## 2020-07-05 ENCOUNTER — Other Ambulatory Visit: Payer: Self-pay

## 2020-07-05 ENCOUNTER — Encounter: Payer: Self-pay | Admitting: Occupational Therapy

## 2020-07-05 ENCOUNTER — Ambulatory Visit: Payer: Medicare Other | Admitting: Occupational Therapy

## 2020-07-05 DIAGNOSIS — M6281 Muscle weakness (generalized): Secondary | ICD-10-CM | POA: Diagnosis not present

## 2020-07-05 DIAGNOSIS — R41840 Attention and concentration deficit: Secondary | ICD-10-CM | POA: Diagnosis not present

## 2020-07-05 DIAGNOSIS — R278 Other lack of coordination: Secondary | ICD-10-CM

## 2020-07-05 DIAGNOSIS — R2681 Unsteadiness on feet: Secondary | ICD-10-CM | POA: Diagnosis not present

## 2020-07-05 DIAGNOSIS — R41842 Visuospatial deficit: Secondary | ICD-10-CM | POA: Diagnosis not present

## 2020-07-05 DIAGNOSIS — R2689 Other abnormalities of gait and mobility: Secondary | ICD-10-CM

## 2020-07-05 DIAGNOSIS — M25631 Stiffness of right wrist, not elsewhere classified: Secondary | ICD-10-CM

## 2020-07-05 DIAGNOSIS — R41844 Frontal lobe and executive function deficit: Secondary | ICD-10-CM | POA: Diagnosis not present

## 2020-07-05 DIAGNOSIS — S065X9A Traumatic subdural hemorrhage with loss of consciousness of unspecified duration, initial encounter: Secondary | ICD-10-CM | POA: Diagnosis not present

## 2020-07-05 NOTE — Therapy (Signed)
[facility] [address] [place], [place], [address] Phone: [phone]   Fax:  [phone]  Occupational Therapy Treatment  Patient Details  Name: Isaiah [name] [name] MRN: [id number] Date of Birth: 1966/02/29 Referring Provider (OT): Posey Pronto, Ankit A.   Encounter Date: 07/05/2020   OT End of Session - 07/05/20 1450    Visit Number 6    Number of Visits 17    Date for OT Re-Evaluation 08/09/20    Authorization Type Medicare    Authorization Time Period OOP has been met. No financial responsibility.    Authorization - Visit Number 6    Authorization - Number of Visits 10    Progress Note Due on Visit 10    OT Start Time 7322    OT Stop Time 1530    OT Time Calculation (min) 41 min    Activity Tolerance Patient tolerated treatment well    Behavior During Therapy WFL for tasks assessed/performed           Past Medical History:  Diagnosis Date  . A-fib ([place])   . Anemia   . Asthma   . DM type 2 (diabetes mellitus, type 2) ([place]) 06/09/2019  . ESRD (end stage renal disease) on dialysis ([place])   . Essential hypertension 06/09/2019  . GIB (gastrointestinal bleeding)    Recurrent episodes- 09/2014, 09/2015 and 05/2016  . Gout   . History of recent blood transfusion 10/27/14   2 Units PRBC's  . Hyperkalemia 07/2011  . Multiple myeloma ([place])   . OSA on CPAP   . Pulmonary embolism ([place]) 07/2011; 09/27/2014   a. Bilat PE 07/2011 - unclear cause, tx with 6 months Coumadin.;   . Seizure disorder ([place]) 06/09/2019  . Sepsis ([place])   . Sickle cell-thalassemia disease ([place])    a. Sickle cell trait.  . Sleep apnea   . Stroke ([place]) 09/2015   R-MCA, L-MCA, PCA and bilateral cerebellar complicated by DVT/PE  . Subdural hematoma ([place]) 05/2019    Past Surgical History:  Procedure Laterality Date  . AV FISTULA PLACEMENT Right 12/05/2015   Procedure: INSERTION OF ARTERIOVENOUS (AV) GORE-TEX GRAFT ARM;  Surgeon: Elam Dutch, MD;   Location: [facility];  Service: Vascular;  Laterality: Right;  . BONE MARROW BIOPSY    . CATARACT EXTRACTION  08/2019  . CATARACT EXTRACTION  09/2019  . CHOLECYSTECTOMY    . CHOLECYSTECTOMY  1990's?  . COLONOSCOPY WITH PROPOFOL N/A 01/22/2017   Procedure: COLONOSCOPY WITH PROPOFOL;  Surgeon: Wilford Corner, MD;  Location: [facility];  Service: Endoscopy;  Laterality: N/A;  . EYE SURGERY Right   . INSERTION OF DIALYSIS CATHETER Right 10/16/2015   Procedure: INSERTION OF PALINDROME DIALYSIS CATHETER ;  Surgeon: Elam Dutch, MD;  Location: [facility];  Service: Vascular;  Laterality: Right;  . OTHER SURGICAL HISTORY     Retinal surgery  . PARS PLANA VITRECTOMY  02/17/2012   Procedure: PARS PLANA VITRECTOMY WITH 25 GAUGE;  Surgeon: Hayden Pedro, MD;  Location: [facility];  Service: Ophthalmology;  Laterality: Right;  . PERIPHERAL VASCULAR CATHETERIZATION N/A 03/20/2016   Procedure: A/V Shuntogram/Fistulagram;  Surgeon: Conrad Renick, MD;  Location: [facility];  Service: Cardiovascular;  Laterality: N/A;  . TEE WITHOUT CARDIOVERSION N/A 12/02/2016   Procedure: TRANSESOPHAGEAL ECHOCARDIOGRAM (TEE);  Surgeon: Larey Dresser, MD;  Location: [facility];  Service: Cardiovascular;  Laterality: N/A;    There were no vitals filed for this visit.   Subjective Assessment -  07/05/20 1450    Subjective  Pt denies any pain today. Pt reports no falls and no changes    Patient is accompanied by: Family member   spouse   Pertinent History multiple myeloma (currently undergoing chemo treatment), DM, CVA, DVT, seizure disorder, SDH, ESRD on dialysis, and history of SDH x 2    Limitations Fall. No Driving. RUE fistula-dialysis; hx of seizures    Patient Stated Goals write better with his right hand, pick things up better with R hand    Currently in Pain? No/denies              [facility] OT Assessment - 07/05/20 1522      Vision Assessment   Ocular Range of Motion Restricted looking down    Saccades  Decreased speed of saccadic movement    Visual Fields --   nasal field/inferior impaired   Diplopia Assessment --   pt denies diplopia   Comment pt unable to read time on clock, months on the calendar, etc. Pt reports difficulty watching tv and focusing.                    OT Treatments/Exercises (OP) - 07/05/20 1511      Transfers   Comments ambulatoin with CGA with RW And gaitbelt      Visual/Perceptual Exercises   Other Exercises see visual assessment. pt reports difficulty with focusing.      Neurological Re-education Exercises   Shoulder Flexion AROM;Both;10 reps;Supine   unweighted dowen   Shoulder Protraction AROM;10 reps;Supine    Shoulder Horizontal ABduction AROM;Both;10 reps;Supine    Other Grasp and Release Exercises  Pt had difficulty with vision and grasp on large pegs with putting large pegs in pegboard. Resistance clothespins with RUE. mod verbal, visual and tactiles cues for motor planning, grasp and positioning      Functional Reaching Activities   High Level good overhead reach with RUE this day. Pt fatigued after approximately 6 reps                    OT Short Term Goals - 07/05/20 1517      OT SHORT TERM GOAL #1   Title Patient and caregiver will be independent with HEP 07/12/20    Time 4    Period Weeks    Status On-going    Target Date 07/12/20      OT SHORT TERM GOAL #2   Title Patient will perform UB dressing with min A in order to increase independence with ADLs    Time 4    Period Weeks    Status New      OT SHORT TERM GOAL #3   Title Patient will demonstrate improved function use of RUE as evidenced by increasing Box and Blocks score to 12 blocks or more.    Baseline RUE 9    Time 4    Period Weeks    Status New      OT SHORT TERM GOAL #4   Title Pt will improve RUE grip strength by at least 3 lbs or more in order to manage clothing with more independence with toileting    Time 4    Period Weeks    Status New       OT SHORT TERM GOAL #5   Title Pt will perform all aspects of toileting with min A consistently    Time 4    Period Weeks    Status New  OT SHORT TERM GOAL #6   Title Pt and spouse will report patient utilizing RUE for functional tasks 15% of the time in daily activities.    Time 4    Period Weeks    Status On-going             OT Long Term Goals - 06/14/20 1808      OT LONG TERM GOAL #1   Title Pt will be independent with updated HEP 08/09/2020    Time 8    Period Weeks    Status New    Target Date 08/09/20      OT LONG TERM GOAL #2   Title Pt will improve RUE shoulder flexion to 110 degrees in order to obtain lightweight object from overhead cabinet.    Time 8    Period Weeks    Status New      OT LONG TERM GOAL #3   Title Patient will demonstrate improved function use of RUE as evidenced by increasing Box and Blocks score to 17 blocks or more.    Time 8    Period Weeks    Status New      OT LONG TERM GOAL #4   Title Patient will perform UB dressing with supervision with verbal cues in order to increase indepedence with ADLs.    Time 8    Period Weeks    Status New      OT LONG TERM GOAL #5   Title Pt will improve RUE grip strength by at least 6 lbs in order to manage clothing with more independence with toileting    Time 8    Period Weeks    Status New      OT LONG TERM GOAL #6   Title Pt will perform all aspects of toileting with distant supervision for safety in order to increase independence with ADLs.    Time 8    Period Weeks    Status New      OT LONG TERM GOAL #7   Title Pt and spouse will report patient utilizing RUE for functional tasks 25% of the time in daily activities.    Time 8    Period Weeks    Status New                 Plan - 07/05/20 1538    Clinical Impression Statement Pt is progressing towards goals. Continued need for increase visualspatial awareness, RUE coordination and increase independence with ADLs.    OT  Occupational Profile and History Detailed Assessment- Review of Records and additional review of physical, cognitive, psychosocial history related to current functional performance    Occupational performance deficits (Please refer to evaluation for details): IADL's;ADL's;Leisure;Social Participation    Body Structure / Function / Physical Skills ADL;Decreased knowledge of use of DME;Gait;Strength;Tone;GMC;Dexterity;Balance;Body mechanics;Proprioception;UE functional use;ROM;IADL;Coordination;FMC;Vision;Flexibility;Mobility    Cognitive Skills Attention;Problem Solve;Safety Awareness;Sequencing;Energy/Drive;Thought;Learn;Memory;Understand;Perception    Rehab Potential Good    Clinical Decision Making Limited treatment options, no task modification necessary    Comorbidities Affecting Occupational Performance: May have comorbidities impacting occupational performance    Modification or Assistance to Complete Evaluation  No modification of tasks or assist necessary to complete eval    OT Frequency 2x / week    OT Duration 8 weeks    OT Treatment/Interventions Self-care/ADL training;Moist Heat;Fluidtherapy;DME and/or AE instruction;Balance training;Gait Training;Therapeutic activities;Cognitive remediation/compensation;Therapeutic exercise;Ultrasound;Neuromuscular education;Passive range of motion;Visual/perceptual remediation/compensation;Functional Mobility Training;Electrical Stimulation;Energy conservation;Manual Therapy;Patient/family education    Plan RUE coordination, issue HEP with supine  shoulder exercises    Recommended Other Services Recommended ST referral - spouse asked to wait.    Consulted and Agree with Plan of Care Patient;Family member/caregiver    Family Member Consulted spouse           Patient will benefit from skilled therapeutic intervention in order to improve the following deficits and impairments:   Body Structure / Function / Physical Skills: ADL, Decreased knowledge of  use of DME, Gait, Strength, Tone, GMC, Dexterity, Balance, Body mechanics, Proprioception, UE functional use, ROM, IADL, Coordination, FMC, Vision, Flexibility, Mobility Cognitive Skills: Attention, Problem Solve, Safety Awareness, Sequencing, Energy/Drive, Thought, Learn, Memory, Understand, Perception     Visit Diagnosis: Other lack of coordination  Visuospatial deficit  Frontal lobe and executive function deficit  Stiffness of right wrist, not elsewhere classified  Unsteadiness on feet  Muscle weakness (generalized)  Attention and concentration deficit    Problem List Patient Active Problem List   Diagnosis Date Noted  . Colitis 03/15/2020  . AMS (altered mental status) 10/05/2019  . Acute metabolic encephalopathy 76/72/0947  . Headache, unspecified 09/28/2019  . Abnormality of gait 09/13/2019  . Diabetes mellitus with end-stage renal disease ([place]) 08/07/2019  . Benign hypertensive kidney disease with chronic kidney disease 08/07/2019  . Hypothyroidism, unspecified 07/15/2019  . Spastic hemiparesis ([place]) 07/12/2019  . Dysphagia, oropharyngeal phase 07/05/2019  . Allergy, unspecified, initial encounter 06/27/2019  . PAF (paroxysmal atrial fibrillation) ([place])   . Bacteremia   . Labile blood pressure   . Labile blood glucose   . Controlled type 2 diabetes mellitus with hyperglycemia, without long-term current use of insulin ([place])   . ESRD on dialysis ([place])   . Right sided weakness   . SDH (subdural hematoma) ([place])   . Chronic intracranial subdural hematoma ([facility]) 06/09/2019  . Multiple myeloma not having achieved remission ([place]) 06/09/2019  . ESRD on hemodialysis ([place]) 06/09/2019  . Essential hypertension 06/09/2019  . DM type 2 (diabetes mellitus, type 2) ([place]) 06/09/2019  . Seizure disorder ([place]) 06/09/2019  . Aspiration pneumonia ([place]) 06/09/2019  . History of bacteremia 06/09/2019  . Atrial fibrillation, chronic ([place]) 06/09/2019  . Bilateral kidney stones  05/31/2019  . Proteus infection 05/28/2019  . History of restrictive pulmonary disease 04/07/2019  . Multiple myeloma ([place]) 05/17/2018  . Goals of care, counseling/discussion 05/17/2018  . Partial idiopathic epilepsy with seizures of localized onset, intractable, without status epilepticus ([place]) 11/02/2017  . Iron deficiency anemia, unspecified 01/22/2017  . COPD (chronic obstructive pulmonary disease) ([place]) 11/11/2016  . CVA (cerebral vascular accident) ([place]) 11/11/2016  . GERD (gastroesophageal reflux disease) 11/11/2016  . GI bleed 11/11/2016  . 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  . Obesity (BMI 30-39.9) 11/11/2016  . Pulmonary hypertension ([place]) 11/11/2016  . Patent foramen ovale 11/11/2016  . Hyperparathyroidism, secondary renal ([place]) 11/11/2016  . VTE (venous thromboembolism) 11/11/2016  . Hypotension 11/11/2016  . Systemic hypertension 11/11/2016  . Seizure ([place]) 11/11/2016  . Other seizures ([place]) 07/25/2016  . Encounter for removal of sutures 05/27/2016  . Ingrown nail 03/03/2016  . Onychomycosis due to dermatophyte 03/03/2016  . Other acquired hammer toe 03/03/2016  . Hemiparesis affecting right side as late effect of cerebrovascular accident ([place]) 02/28/2016  . Aphasia complicating stroke 09/62/8366  . Seizures ([place]) 01/08/2016  . History of ischemic left MCA stroke 01/08/2016  . Right sided weakness 01/08/2016  . Atrial fibrillation ([place]) 01/08/2016  . Leukocytosis 01/08/2016  . Benign essential HTN 01/08/2016  . Anemia of chronic disease 01/08/2016  .  Controlled type 2 diabetes mellitus with diabetic nephropathy, without long-term current use of insulin ([place]) 01/08/2016  . History of DVT (deep vein thrombosis) 01/08/2016  . 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 Hypocalcemia 12/26/2015  . Long term (current) use of anticoagulants 11/07/2015  . Aftercare including intermittent dialysis ([place]) 11/02/2015  . Other specified coagulation defects ([place])  11/02/2015  . Complication of vascular dialysis catheter 11/02/2015  . Diarrhea, unspecified 11/02/2015  . Fever, unspecified 11/02/2015  . Pruritus, unspecified 11/02/2015  . Shortness of breath 11/02/2015  . ESRD (end stage renal disease) on dialysis [facility])     Zachery Conch MOT, OTR/L  07/05/2020, 3:40 PM  [facility] [address] [place], [place], [address] Phone: [phone]   Fax:  [phone]  Name: Isaiah [name] [facility] MRN: [id number] Date of Birth: October 26, 1965

## 2020-07-06 DIAGNOSIS — N2581 Secondary hyperparathyroidism of renal origin: Secondary | ICD-10-CM | POA: Diagnosis not present

## 2020-07-06 DIAGNOSIS — Z992 Dependence on renal dialysis: Secondary | ICD-10-CM | POA: Diagnosis not present

## 2020-07-06 DIAGNOSIS — D631 Anemia in chronic kidney disease: Secondary | ICD-10-CM | POA: Diagnosis not present

## 2020-07-06 DIAGNOSIS — E1129 Type 2 diabetes mellitus with other diabetic kidney complication: Secondary | ICD-10-CM | POA: Diagnosis not present

## 2020-07-06 DIAGNOSIS — N186 End stage renal disease: Secondary | ICD-10-CM | POA: Diagnosis not present

## 2020-07-06 MED FILL — clonazePAM 0.5 MG TABS: 0.5 | 35 days supply | Qty: 30 | Fill #5

## 2020-07-06 NOTE — Therapy (Signed)
[facility] [address] [place], [place], [address] Phone: [phone]   Fax:  [phone]  Physical Therapy Treatment  Patient Details  Name: Isaiah [name] [facility] MRN: [id number] Date of Birth: 1966-03-13 Referring Provider (PT): Delice Lesch, MD   Encounter Date: 07/05/2020   PT End of Session - 07/06/20 1617    Visit Number 18    Number of Visits 27   per recent recert wk of 4/23   Date for PT Re-Evaluation 08/30/20    Authorization Type Medicare; PN/Recert 5/36/14    Progress Note Due on Visit 20    PT Start Time 1408    PT Stop Time 1446    PT Time Calculation (min) 38 min    Equipment Utilized During Treatment Gait belt    Activity Tolerance Patient tolerated treatment well    Behavior During Therapy [facility] for tasks assessed/performed           Past Medical History:  Diagnosis Date  . A-fib ([place])   . Anemia   . Asthma   . DM type 2 (diabetes mellitus, type 2) ([place]) 06/09/2019  . ESRD (end stage renal disease) on dialysis ([place])   . Essential hypertension 06/09/2019  . GIB (gastrointestinal bleeding)    Recurrent episodes- 09/2014, 09/2015 and 05/2016  . Gout   . History of recent blood transfusion 10/27/14   2 Units PRBC's  . Hyperkalemia 07/2011  . Multiple myeloma ([place])   . OSA on CPAP   . Pulmonary embolism ([place]) 07/2011; 09/27/2014   a. Bilat PE 07/2011 - unclear cause, tx with 6 months Coumadin.;   . Seizure disorder ([place]) 06/09/2019  . Sepsis ([place])   . Sickle cell-thalassemia disease ([place])    a. Sickle cell trait.  . Sleep apnea   . Stroke ([place]) 09/2015   R-MCA, L-MCA, PCA and bilateral cerebellar complicated by DVT/PE  . Subdural hematoma ([place]) 05/2019    Past Surgical History:  Procedure Laterality Date  . AV FISTULA PLACEMENT Right 12/05/2015   Procedure: INSERTION OF ARTERIOVENOUS (AV) GORE-TEX GRAFT ARM;  Surgeon: Elam Dutch, MD;  Location: [facility];  Service: Vascular;  Laterality: Right;   . BONE MARROW BIOPSY    . CATARACT EXTRACTION  08/2019  . CATARACT EXTRACTION  09/2019  . CHOLECYSTECTOMY    . CHOLECYSTECTOMY  1990's?  . COLONOSCOPY WITH PROPOFOL N/A 01/22/2017   Procedure: COLONOSCOPY WITH PROPOFOL;  Surgeon: Wilford Corner, MD;  Location: [facility];  Service: Endoscopy;  Laterality: N/A;  . EYE SURGERY Right   . INSERTION OF DIALYSIS CATHETER Right 10/16/2015   Procedure: INSERTION OF PALINDROME DIALYSIS CATHETER ;  Surgeon: Elam Dutch, MD;  Location: [facility];  Service: Vascular;  Laterality: Right;  . OTHER SURGICAL HISTORY     Retinal surgery  . PARS PLANA VITRECTOMY  02/17/2012   Procedure: PARS PLANA VITRECTOMY WITH 25 GAUGE;  Surgeon: Hayden Pedro, MD;  Location: [facility];  Service: Ophthalmology;  Laterality: Right;  . PERIPHERAL VASCULAR CATHETERIZATION N/A 03/20/2016   Procedure: A/V Shuntogram/Fistulagram;  Surgeon: Conrad Muir, MD;  Location: [facility];  Service: Cardiovascular;  Laterality: N/A;  . TEE WITHOUT CARDIOVERSION N/A 12/02/2016   Procedure: TRANSESOPHAGEAL ECHOCARDIOGRAM (TEE);  Surgeon: Larey Dresser, MD;  Location: [facility];  Service: Cardiovascular;  Laterality: N/A;    There were no vitals filed for this visit.   Subjective Assessment - 07/05/20 1413    Subjective Forgot my walker today, and  had to walk in with mother with no device using the gait belt.    Patient is accompained by: Family member   wife, provides history   Pertinent History multiple myeloma, ESRD on dialysis, DM type II, DVT with IVC filter, CVA, COPD, a-fib; chemo treatments down to 1x/month (next is August 17th)    Patient Stated Goals To get back to walk without the walker, back to his baseline    Currently in Pain? No/denies                             [facility] Adult PT Treatment/Exercise - 07/05/20 1408      Transfers   Transfers Sit to Stand;Stand to Sit    Sit to Stand 5: Supervision;4: Min guard    Sit to Stand Details  Verbal cues for technique;Verbal cues for sequencing    Stand to Sit 4: Min guard;With upper extremity assist;To bed;To chair/3-in-1    Stand to Sit Details (indicate cue type and reason) Verbal cues for sequencing;Verbal cues for technique    Comments Peformed at least 5 reps throughout session, cues for hand placement at times      Ambulation/Gait   Ambulation/Gait Yes    Ambulation/Gait Assistance 4: Min guard    Ambulation/Gait Assistance Details Gait using clinic walker from lobby to gym, 100 ft, with min guard, occasional veering to L, able to self-correct.    Ambulation Distance (Feet) 200 Feet    Assistive device Rolling walker    Gait Comments Additional gait with RW, with turns and changes of direction in gym around furniture, with pt requiring extra time (and correction x 2) to avoid obstacles in tight spots on L side.  Attempted gait with SPC today (as pt's goal is to do this and he walked in with mother iwth no device today).  Gait with cane in RUE and HHA LUE 20 ft x 2,  with min assist (mod assist at times) to correct pt's instability; cues for cane placement and posture, step length.  Discussed with pt and mother that cane IS NOT a safe option for gait at home at this time, to keep using walker.               Balance Exercises - 07/05/20 1415      Balance Exercises: Standing   Standing Eyes Opened Narrow base of support (BOS);Head turns;5 reps   head nods   Standing Eyes Opened Limitations on floor with feet apart for head movements left<>right, up<>down with min guard assist. progressed to eyes closed (see below)    Standing Eyes Closed Narrow base of support (BOS);Solid surface;3 reps;20 secs;Limitations    Standing Eyes Closed Limitations on floor with sturdy surface in front with feet together, 30 sec's x 3 reps. min assist with increased lateral sway noted.     SLS with Vectors Solid surface;Other reps (comment);Limitations;Upper extremity assist 2    SLS with Vectors  Limitations Alternating step taps to 4" step x 10 reps, BUE support, min guard assist.    Partial Tandem Stance Eyes open;1 rep;30 secs;Limitations;Upper extremity support 2    Other Standing Exercises Using 2" black obstacle, forward step over and return to midline, x 10 reps, BUE support, then alternating step taps over obstacles, with 1 UE support.  Side step over obstacle x 10 reps with BUE support    Other Standing Exercises Comments Standing balance at RW (PT provides stability so it  doesn't roll) with rhythmic stabilization given lateral directions 5 reps each, then anterior/posterior direction x 3 reps for improved hip and trunk stability.               PT Short Term Goals - 07/03/20 1239      PT SHORT TERM GOAL #1   Title Pt will perform HEP independenlty for improved strength, balance and gait.    Baseline performs, but needs supervision for safety in standing    Time 4    Period Weeks    Status Partially Met    Target Date 06/28/20      PT SHORT TERM GOAL #2   Title Pt will improve gait velocity to at least 1.3 ft/sec for improved gait efficiency and safety.    Baseline 1.34f/s with RW 05/31/20; 1.46 ft/sec 07/03/2020    Time 4    Period Weeks    Status Achieved    Target Date 06/28/20             PT Long Term Goals - 06/05/20 2150      PT LONG TERM GOAL #1   Title Pt will be independent with progression of HEP for improved strength, blaance, gait.    Time 8    Period Weeks    Status On-going    Target Date 07/26/20      PT LONG TERM GOAL #2   Title Pt will imrpove Berg Balance score to at least 33/56 for decreased fall risk.    Baseline 30/56 05/31/20    Time 8    Period Weeks    Status On-going    Target Date 07/26/20      PT LONG TERM GOAL #3   Title Pt will improve TUG score to less than or equal to 30 seconds for decreased fall risk.    Baseline 37 sec 05/31/20    Time 8    Period Weeks    Status On-going    Target Date 07/26/20      PT LONG  TERM GOAL #4   Title Pt will improve gait velocity to at least 1.8 ft/sec for improved gait velocity and decreased fall risk.    Baseline 1.12 ft/s 05/31/20    Time 8    Period Weeks    Status On-going    Target Date 07/26/20      PT LONG TERM GOAL #5   Title Pt will ascend/descent 12 steps with both handrails with supervision only, to negotiate steps at home.    Baseline not attempted 05/31/20    Time 8    Period Weeks    Status On-going    Target Date 07/26/20                 Plan - 07/06/20 1617    Clinical Impression Statement Pt forgot RW today and walked in with assist of mother holding gait belt.  Used this as opportunity for short distance gait in clinic with use of cane, with PT providing min/moderate assistance due to trunk weakness, and instability.  PT educated pt and family to NOT use cane at home as it is not safe and to conitnue use of RW at all times.    Personal Factors and Comorbidities Comorbidity 3+    Comorbidities PMH:  multiple myeloma, ESRD on dialysis, DM type II, DVT with IVC filter, CVA, COPD, a-fib    Examination-Activity Limitations Locomotion Level;Transfers;Stairs;Stand    Examination-Participation Restrictions Community Activity    Stability/Clinical Decision Making  Evolving/Moderate complexity    Rehab Potential Good    PT Frequency 2x / week    PT Duration 8 weeks   per recert 0/73/71   PT Treatment/Interventions ADLs/Self Care Home Management;DME Instruction;Neuromuscular re-education;Balance training;Therapeutic exercise;Therapeutic activities;Functional mobility training;Stair training;Gait training;Patient/family education    PT Next Visit Plan Continue gait, obstacle negotiation, SLS, functional BLE strengthening; standing balance progressing towards LTGs    PT Home Exercise Plan Access Code [email or web address]    Consulted and Agree with Plan of Care Patient;Family member/caregiver    Family Member Consulted mother           Patient will benefit  from skilled therapeutic intervention in order to improve the following deficits and impairments:  Abnormal gait, Difficulty walking, Decreased safety awareness, Decreased balance, Decreased mobility, Decreased strength  Visit Diagnosis: Other abnormalities of gait and mobility  Unsteadiness on feet  Muscle weakness (generalized)     Problem List Patient Active Problem List   Diagnosis Date Noted  . Colitis 03/15/2020  . AMS (altered mental status) 10/05/2019  . Acute metabolic encephalopathy 46/27/0350  . Headache, unspecified 09/28/2019  . Abnormality of gait 09/13/2019  . Diabetes mellitus with end-stage renal disease ([place]) 08/07/2019  . Benign hypertensive kidney disease with chronic kidney disease 08/07/2019  . Hypothyroidism, unspecified 07/15/2019  . Spastic hemiparesis ([place]) 07/12/2019  . Dysphagia, oropharyngeal phase 07/05/2019  . Allergy, unspecified, initial encounter 06/27/2019  . PAF (paroxysmal atrial fibrillation) ([place])   . Bacteremia   . Labile blood pressure   . Labile blood glucose   . Controlled type 2 diabetes mellitus with hyperglycemia, without long-term current use of insulin ([place])   . ESRD on dialysis ([place])   . Right sided weakness   . SDH (subdural hematoma) ([place])   . Chronic intracranial subdural hematoma ([facility]) 06/09/2019  . Multiple myeloma not having achieved remission ([place]) 06/09/2019  . ESRD on hemodialysis ([place]) 06/09/2019  . Essential hypertension 06/09/2019  . DM type 2 (diabetes mellitus, type 2) ([place]) 06/09/2019  . Seizure disorder ([place]) 06/09/2019  . Aspiration pneumonia ([place]) 06/09/2019  . History of bacteremia 06/09/2019  . Atrial fibrillation, chronic ([place]) 06/09/2019  . Bilateral kidney stones 05/31/2019  . Proteus infection 05/28/2019  . History of restrictive pulmonary disease 04/07/2019  . Multiple myeloma ([place]) 05/17/2018  . Goals of care, counseling/discussion 05/17/2018  . Partial idiopathic epilepsy with seizures of  localized onset, intractable, without status epilepticus ([place]) 11/02/2017  . Iron deficiency anemia, unspecified 01/22/2017  . COPD (chronic obstructive pulmonary disease) ([place]) 11/11/2016  . CVA (cerebral vascular accident) ([place]) 11/11/2016  . GERD (gastroesophageal reflux disease) 11/11/2016  . GI bleed 11/11/2016  . 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  . Obesity (BMI 30-39.9) 11/11/2016  . Pulmonary hypertension ([place]) 11/11/2016  . Patent foramen ovale 11/11/2016  . Hyperparathyroidism, secondary renal ([place]) 11/11/2016  . VTE (venous thromboembolism) 11/11/2016  . Hypotension 11/11/2016  . Systemic hypertension 11/11/2016  . Seizure ([place]) 11/11/2016  . Other seizures ([place]) 07/25/2016  . Encounter for removal of sutures 05/27/2016  . Ingrown nail 03/03/2016  . Onychomycosis due to dermatophyte 03/03/2016  . Other acquired hammer toe 03/03/2016  . Hemiparesis affecting right side as late effect of cerebrovascular accident ([place]) 02/28/2016  . Aphasia complicating stroke 09/38/1829  . Seizures ([place]) 01/08/2016  . History of ischemic left MCA stroke 01/08/2016  . Right sided weakness 01/08/2016  . Atrial fibrillation ([place]) 01/08/2016  . Leukocytosis 01/08/2016  . Benign essential HTN 01/08/2016  . Anemia of chronic  disease 01/08/2016  . Controlled type 2 diabetes mellitus with diabetic nephropathy, without long-term current use of insulin ([place]) 01/08/2016  . History of DVT (deep vein thrombosis) 01/08/2016  . 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 Hypocalcemia 12/26/2015  . Long term (current) use of anticoagulants 11/07/2015  . Aftercare including intermittent dialysis ([place]) 11/02/2015  . Other specified coagulation defects ([place]) 11/02/2015  . Complication of vascular dialysis catheter 11/02/2015  . Diarrhea, unspecified 11/02/2015  . Fever, unspecified 11/02/2015  . Pruritus, unspecified 11/02/2015  . Shortness of breath 11/02/2015  . ESRD (end stage renal  disease) on dialysis ([place])     Kandiss Ihrig W. 07/06/2020, 4:20 PM  Frazier Butt., PT   [facility] [address] [place], [place], [address] Phone: [phone]   Fax:  [phone]  Name: Isaiah [name] [facility] MRN: [id number] Date of Birth: [birth year]

## 2020-07-09 ENCOUNTER — Other Ambulatory Visit: Payer: Self-pay | Admitting: Internal Medicine

## 2020-07-09 DIAGNOSIS — E1122 Type 2 diabetes mellitus with diabetic chronic kidney disease: Secondary | ICD-10-CM | POA: Diagnosis not present

## 2020-07-09 DIAGNOSIS — H524 Presbyopia: Secondary | ICD-10-CM | POA: Diagnosis not present

## 2020-07-09 DIAGNOSIS — R41840 Attention and concentration deficit: Secondary | ICD-10-CM | POA: Diagnosis not present

## 2020-07-09 DIAGNOSIS — Z23 Encounter for immunization: Secondary | ICD-10-CM | POA: Diagnosis not present

## 2020-07-09 DIAGNOSIS — R2689 Other abnormalities of gait and mobility: Secondary | ICD-10-CM | POA: Diagnosis not present

## 2020-07-09 DIAGNOSIS — M6281 Muscle weakness (generalized): Secondary | ICD-10-CM | POA: Diagnosis not present

## 2020-07-09 DIAGNOSIS — H52223 Regular astigmatism, bilateral: Secondary | ICD-10-CM | POA: Diagnosis not present

## 2020-07-09 DIAGNOSIS — R278 Other lack of coordination: Secondary | ICD-10-CM | POA: Diagnosis not present

## 2020-07-09 DIAGNOSIS — M25631 Stiffness of right wrist, not elsewhere classified: Secondary | ICD-10-CM | POA: Diagnosis not present

## 2020-07-09 DIAGNOSIS — R41844 Frontal lobe and executive function deficit: Secondary | ICD-10-CM | POA: Diagnosis not present

## 2020-07-09 DIAGNOSIS — R41842 Visuospatial deficit: Secondary | ICD-10-CM | POA: Diagnosis not present

## 2020-07-09 DIAGNOSIS — R2681 Unsteadiness on feet: Secondary | ICD-10-CM | POA: Diagnosis not present

## 2020-07-09 DIAGNOSIS — D631 Anemia in chronic kidney disease: Secondary | ICD-10-CM | POA: Diagnosis not present

## 2020-07-09 DIAGNOSIS — H5212 Myopia, left eye: Secondary | ICD-10-CM | POA: Diagnosis not present

## 2020-07-09 DIAGNOSIS — E1129 Type 2 diabetes mellitus with other diabetic kidney complication: Secondary | ICD-10-CM | POA: Diagnosis not present

## 2020-07-09 DIAGNOSIS — N2581 Secondary hyperparathyroidism of renal origin: Secondary | ICD-10-CM | POA: Diagnosis not present

## 2020-07-09 DIAGNOSIS — N186 End stage renal disease: Secondary | ICD-10-CM | POA: Diagnosis not present

## 2020-07-09 DIAGNOSIS — H5201 Hypermetropia, right eye: Secondary | ICD-10-CM | POA: Diagnosis not present

## 2020-07-09 DIAGNOSIS — H3561 Retinal hemorrhage, right eye: Secondary | ICD-10-CM | POA: Diagnosis not present

## 2020-07-09 DIAGNOSIS — Z992 Dependence on renal dialysis: Secondary | ICD-10-CM | POA: Diagnosis not present

## 2020-07-09 MED FILL — MIDODRINE HCL 10 MG TABS: 10 | 30 days supply | Qty: 60 | Fill #0

## 2020-07-10 ENCOUNTER — Ambulatory Visit: Payer: Medicare Other | Attending: Physical Medicine & Rehabilitation | Admitting: Physical Therapy

## 2020-07-10 ENCOUNTER — Other Ambulatory Visit: Payer: Self-pay

## 2020-07-10 ENCOUNTER — Encounter: Payer: Self-pay | Admitting: Occupational Therapy

## 2020-07-10 ENCOUNTER — Ambulatory Visit: Payer: Medicare Other | Admitting: Occupational Therapy

## 2020-07-10 DIAGNOSIS — M6281 Muscle weakness (generalized): Secondary | ICD-10-CM

## 2020-07-10 DIAGNOSIS — R278 Other lack of coordination: Secondary | ICD-10-CM

## 2020-07-10 DIAGNOSIS — H52223 Regular astigmatism, bilateral: Secondary | ICD-10-CM | POA: Diagnosis not present

## 2020-07-10 DIAGNOSIS — M25631 Stiffness of right wrist, not elsewhere classified: Secondary | ICD-10-CM | POA: Diagnosis not present

## 2020-07-10 DIAGNOSIS — R2681 Unsteadiness on feet: Secondary | ICD-10-CM | POA: Diagnosis not present

## 2020-07-10 DIAGNOSIS — R2689 Other abnormalities of gait and mobility: Secondary | ICD-10-CM | POA: Diagnosis not present

## 2020-07-10 DIAGNOSIS — H5201 Hypermetropia, right eye: Secondary | ICD-10-CM | POA: Diagnosis not present

## 2020-07-10 DIAGNOSIS — R41840 Attention and concentration deficit: Secondary | ICD-10-CM | POA: Insufficient documentation

## 2020-07-10 DIAGNOSIS — R41844 Frontal lobe and executive function deficit: Secondary | ICD-10-CM

## 2020-07-10 DIAGNOSIS — H3561 Retinal hemorrhage, right eye: Secondary | ICD-10-CM | POA: Diagnosis not present

## 2020-07-10 DIAGNOSIS — R41842 Visuospatial deficit: Secondary | ICD-10-CM | POA: Diagnosis not present

## 2020-07-10 DIAGNOSIS — H5212 Myopia, left eye: Secondary | ICD-10-CM | POA: Diagnosis not present

## 2020-07-10 DIAGNOSIS — H524 Presbyopia: Secondary | ICD-10-CM | POA: Diagnosis not present

## 2020-07-10 NOTE — Therapy (Signed)
[facility] [address] [place], [place], [address] Phone: [phone]   Fax:  [phone]  Physical Therapy Treatment  Patient Details  Name: Isaiah [name] MRN: [id number] Date of Birth: Sep 17, 1965 Referring Provider (PT): Delice Lesch, MD   Encounter Date: 07/10/2020   PT End of Session - 07/10/20 2228    Visit Number 19    Number of Visits 27   per recent recert wk of 4/13   Date for PT Re-Evaluation 08/30/20    Authorization Type Medicare; PN/Recert 2/44/01    Progress Note Due on Visit 20    PT Start Time 1530    PT Stop Time 1615    PT Time Calculation (min) 45 min    Equipment Utilized During Treatment Gait belt    Activity Tolerance Patient tolerated treatment well    Behavior During Therapy [name] for tasks assessed/performed           Past Medical History:  Diagnosis Date  . A-fib ([place])   . Anemia   . Asthma   . DM type 2 (diabetes mellitus, type 2) ([place]) 06/09/2019  . ESRD (end stage renal disease) on dialysis ([place])   . Essential hypertension 06/09/2019  . GIB (gastrointestinal bleeding)    Recurrent episodes- 09/2014, 09/2015 and 05/2016  . Gout   . History of recent blood transfusion 10/27/14   2 Units PRBC's  . Hyperkalemia 07/2011  . Multiple myeloma ([place])   . OSA on CPAP   . Pulmonary embolism ([place]) 07/2011; 09/27/2014   a. Bilat PE 07/2011 - unclear cause, tx with 6 months Coumadin.;   . Seizure disorder ([place]) 06/09/2019  . Sepsis ([place])   . Sickle cell-thalassemia disease ([place])    a. Sickle cell trait.  . Sleep apnea   . Stroke ([place]) 09/2015   R-MCA, L-MCA, PCA and bilateral cerebellar complicated by DVT/PE  . Subdural hematoma ([place]) 05/2019    Past Surgical History:  Procedure Laterality Date  . AV FISTULA PLACEMENT Right 12/05/2015   Procedure: INSERTION OF ARTERIOVENOUS (AV) GORE-TEX GRAFT ARM;  Surgeon: Elam Dutch, MD;  Location: [facility];  Service: Vascular;  Laterality: Right;    . BONE MARROW BIOPSY    . CATARACT EXTRACTION  08/2019  . CATARACT EXTRACTION  09/2019  . CHOLECYSTECTOMY    . CHOLECYSTECTOMY  1990's?  . COLONOSCOPY WITH PROPOFOL N/A 01/22/2017   Procedure: COLONOSCOPY WITH PROPOFOL;  Surgeon: Wilford Corner, MD;  Location: [facility];  Service: Endoscopy;  Laterality: N/A;  . EYE SURGERY Right   . INSERTION OF DIALYSIS CATHETER Right 10/16/2015   Procedure: INSERTION OF PALINDROME DIALYSIS CATHETER ;  Surgeon: Elam Dutch, MD;  Location: [facility];  Service: Vascular;  Laterality: Right;  . OTHER SURGICAL HISTORY     Retinal surgery  . PARS PLANA VITRECTOMY  02/17/2012   Procedure: PARS PLANA VITRECTOMY WITH 25 GAUGE;  Surgeon: Hayden Pedro, MD;  Location: [facility];  Service: Ophthalmology;  Laterality: Right;  . PERIPHERAL VASCULAR CATHETERIZATION N/A 03/20/2016   Procedure: A/V Shuntogram/Fistulagram;  Surgeon: Conrad [place], MD;  Location: [facility];  Service: Cardiovascular;  Laterality: N/A;  . TEE WITHOUT CARDIOVERSION N/A 12/02/2016   Procedure: TRANSESOPHAGEAL ECHOCARDIOGRAM (TEE);  Surgeon: Larey Dresser, MD;  Location: [facility];  Service: Cardiovascular;  Laterality: N/A;    There were no vitals filed for this visit.   Subjective Assessment - 07/10/20 1533    Subjective Nothing new or different  to report. He states he's been walking around pretty much every day. No falls.    Pertinent History multiple myeloma, ESRD on dialysis, DM type II, DVT with IVC filter, CVA, COPD, a-fib; chemo treatments down to 1x/month (next is August 17th)    Patient Stated Goals To get back to walk without the walker, back to his baseline                             [name] Adult PT Treatment/Exercise - 07/10/20 0001      Ambulation/Gait   Ambulation/Gait Yes    Ambulation/Gait Assistance 4: Min guard    Ambulation Distance (Feet) 330 Feet    Assistive device Rolling walker   Interval training fast and slow on cue      Knee/Hip Exercises: Standing   Other Standing Knee Exercises 4 square stepping with SPC x4 CW & CCW   needs mod A at times due to LOB x 4   Other Standing Knee Exercises Foot tap on 2" step 3x10 with intermittent UE support on RW               Balance Exercises - 07/10/20 0001      Balance Exercises: Standing   Standing Eyes Opened Narrow base of support (BOS);Head turns   10 reps   Standing Eyes Opened Limitations min A for LOB and sway    Standing Eyes Closed Narrow base of support (BOS);Solid surface;20 secs;Limitations;2 reps    Standing Eyes Closed Limitations min A for LOB & sway    Other Standing Exercises One foot on step 2x30 sec bilat               PT Short Term Goals - 07/03/20 1239      PT SHORT TERM GOAL #1   Title Pt will perform HEP independenlty for improved strength, balance and gait.    Baseline performs, but needs supervision for safety in standing    Time 4    Period Weeks    Status Partially Met    Target Date 06/28/20      PT SHORT TERM GOAL #2   Title Pt will improve gait velocity to at least 1.3 ft/sec for improved gait efficiency and safety.    Baseline 1.47f/s with RW 05/31/20; 1.46 ft/sec 07/03/2020    Time 4    Period Weeks    Status Achieved    Target Date 06/28/20             PT Long Term Goals - 06/05/20 2150      PT LONG TERM GOAL #1   Title Pt will be independent with progression of HEP for improved strength, blaance, gait.    Time 8    Period Weeks    Status On-going    Target Date 07/26/20      PT LONG TERM GOAL #2   Title Pt will imrpove Berg Balance score to at least 33/56 for decreased fall risk.    Baseline 30/56 05/31/20    Time 8    Period Weeks    Status On-going    Target Date 07/26/20      PT LONG TERM GOAL #3   Title Pt will improve TUG score to less than or equal to 30 seconds for decreased fall risk.    Baseline 37 sec 05/31/20    Time 8    Period Weeks    Status On-going  Target Date 07/26/20        PT LONG TERM GOAL #4   Title Pt will improve gait velocity to at least 1.8 ft/sec for improved gait velocity and decreased fall risk.    Baseline 1.12 ft/s 05/31/20    Time 8    Period Weeks    Status On-going    Target Date 07/26/20      PT LONG TERM GOAL #5   Title Pt will ascend/descent 12 steps with both handrails with supervision only, to negotiate steps at home.    Baseline not attempted 05/31/20    Time 8    Period Weeks    Status On-going    Target Date 07/26/20                 Plan - 07/10/20 2224    Clinical Impression Statement Treatment session focused on improving pt's step clearance and multi directional stepping. Continued to work on stability, improving weight shifting, obstacle negotiation, and balance. Pt remains very unsteady with use of SPC and has tendency for posterior lean.    Personal Factors and Comorbidities Comorbidity 3+    Comorbidities PMH:  multiple myeloma, ESRD on dialysis, DM type II, DVT with IVC filter, CVA, COPD, a-fib    Examination-Activity Limitations Locomotion Level;Transfers;Stairs;Stand    Examination-Participation Restrictions Community Activity    Stability/Clinical Decision Making Evolving/Moderate complexity    Rehab Potential Good    PT Frequency 2x / week    PT Duration 8 weeks   per recert 01/16/24   PT Treatment/Interventions ADLs/Self Care Home Management;DME Instruction;Neuromuscular re-education;Balance training;Therapeutic exercise;Therapeutic activities;Functional mobility training;Stair training;Gait training;Patient/family education    PT Next Visit Plan 20th visit progress note. Continue gait, obstacle negotiation, SLS, functional BLE strengthening; standing balance progressing towards LTGs    PT Home Exercise Plan Access Code [email or web address]    Consulted and Agree with Plan of Care Patient;Family member/caregiver    Family Member Consulted mother           Patient will benefit from skilled therapeutic intervention in  order to improve the following deficits and impairments:  Abnormal gait, Difficulty walking, Decreased safety awareness, Decreased balance, Decreased mobility, Decreased strength  Visit Diagnosis: Other lack of coordination  Unsteadiness on feet  Muscle weakness (generalized)  Other abnormalities of gait and mobility     Problem List Patient Active Problem List   Diagnosis Date Noted  . Colitis 03/15/2020  . AMS (altered mental status) 10/05/2019  . Acute metabolic encephalopathy 23/53/6144  . Headache, unspecified 09/28/2019  . Abnormality of gait 09/13/2019  . Diabetes mellitus with end-stage renal disease ([place]) 08/07/2019  . Benign hypertensive kidney disease with chronic kidney disease 08/07/2019  . Hypothyroidism, unspecified 07/15/2019  . Spastic hemiparesis ([place]) 07/12/2019  . Dysphagia, oropharyngeal phase 07/05/2019  . Allergy, unspecified, initial encounter 06/27/2019  . PAF (paroxysmal atrial fibrillation) ([place])   . Bacteremia   . Labile blood pressure   . Labile blood glucose   . Controlled type 2 diabetes mellitus with hyperglycemia, without long-term current use of insulin ([place])   . ESRD on dialysis ([place])   . Right sided weakness   . SDH (subdural hematoma) ([place])   . Chronic intracranial subdural hematoma ([facility]) 06/09/2019  . Multiple myeloma not having achieved remission ([place]) 06/09/2019  . ESRD on hemodialysis ([place]) 06/09/2019  . Essential hypertension 06/09/2019  . DM type 2 (diabetes mellitus, type 2) ([place]) 06/09/2019  . Seizure disorder ([place]) 06/09/2019  . Aspiration pneumonia ([place]) 06/09/2019  .  History of bacteremia 06/09/2019  . Atrial fibrillation, chronic ([place]) 06/09/2019  . Bilateral kidney stones 05/31/2019  . Proteus infection 05/28/2019  . History of restrictive pulmonary disease 04/07/2019  . Multiple myeloma ([place]) 05/17/2018  . Goals of care, counseling/discussion 05/17/2018  . Partial idiopathic epilepsy with seizures of localized onset,  intractable, without status epilepticus ([place]) 11/02/2017  . Iron deficiency anemia, unspecified 01/22/2017  . COPD (chronic obstructive pulmonary disease) ([place]) 11/11/2016  . CVA (cerebral vascular accident) ([place]) 11/11/2016  . GERD (gastroesophageal reflux disease) 11/11/2016  . GI bleed 11/11/2016  . 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  . Obesity (BMI 30-39.9) 11/11/2016  . Pulmonary hypertension ([place]) 11/11/2016  . Patent foramen ovale 11/11/2016  . Hyperparathyroidism, secondary renal ([place]) 11/11/2016  . VTE (venous thromboembolism) 11/11/2016  . Hypotension 11/11/2016  . Systemic hypertension 11/11/2016  . Seizure ([place]) 11/11/2016  . Other seizures ([place]) 07/25/2016  . Encounter for removal of sutures 05/27/2016  . Ingrown nail 03/03/2016  . Onychomycosis due to dermatophyte 03/03/2016  . Other acquired hammer toe 03/03/2016  . Hemiparesis affecting right side as late effect of cerebrovascular accident ([place]) 02/28/2016  . Aphasia complicating stroke 41/96/2229  . Seizures ([place]) 01/08/2016  . History of ischemic left MCA stroke 01/08/2016  . Right sided weakness 01/08/2016  . Atrial fibrillation ([place]) 01/08/2016  . Leukocytosis 01/08/2016  . Benign essential HTN 01/08/2016  . Anemia of chronic disease 01/08/2016  . Controlled type 2 diabetes mellitus with diabetic nephropathy, without long-term current use of insulin ([place]) 01/08/2016  . History of DVT (deep vein thrombosis) 01/08/2016  . 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 Hypocalcemia 12/26/2015  . Long term (current) use of anticoagulants 11/07/2015  . Aftercare including intermittent dialysis ([place]) 11/02/2015  . Other specified coagulation defects ([place]) 11/02/2015  . Complication of vascular dialysis catheter 11/02/2015  . Diarrhea, unspecified 11/02/2015  . Fever, unspecified 11/02/2015  . Pruritus, unspecified 11/02/2015  . Shortness of breath 11/02/2015  . ESRD (end stage renal disease) on dialysis  [place] [name])     Elzada Pytel April Ma L Andersson Larrabee PT, DPT 07/10/2020, 10:35 PM  [facility] [address] [place], [place], [address] Phone: [phone]   Fax:  [phone]  Name: Isaiah [name] MRN: [id number] Date of Birth: 1966/08/21

## 2020-07-10 NOTE — Therapy (Signed)
[facility] [address] [place], [place], [address] Phone: [phone]   Fax:  [phone]  Occupational Therapy Treatment  Patient Details  Name: Reginaldo Hazard [name] MRN: [id number] Date of Birth: Mar 23, 1966 Referring Provider (OT): Posey Pronto, Ankit A.   Encounter Date: 07/10/2020   OT End of Session - 07/10/20 1619    Visit Number 7    Number of Visits 17    Date for OT Re-Evaluation 08/09/20    Authorization Type Medicare    Authorization Time Period OOP has been met. No financial responsibility.    Authorization - Visit Number 7    Authorization - Number of Visits 10    Progress Note Due on Visit 10    OT Start Time 1618    OT Stop Time 1700    OT Time Calculation (min) 42 min    Activity Tolerance Patient tolerated treatment well    Behavior During Therapy WFL for tasks assessed/performed           Past Medical History:  Diagnosis Date  . A-fib ([place])   . Anemia   . Asthma   . DM type 2 (diabetes mellitus, type 2) ([place]) 06/09/2019  . ESRD (end stage renal disease) on dialysis ([place])   . Essential hypertension 06/09/2019  . GIB (gastrointestinal bleeding)    Recurrent episodes- 09/2014, 09/2015 and 05/2016  . Gout   . History of recent blood transfusion 10/27/14   2 Units PRBC's  . Hyperkalemia 07/2011  . Multiple myeloma ([place])   . OSA on CPAP   . Pulmonary embolism ([place]) 07/2011; 09/27/2014   a. Bilat PE 07/2011 - unclear cause, tx with 6 months Coumadin.;   . Seizure disorder ([place]) 06/09/2019  . Sepsis ([place])   . Sickle cell-thalassemia disease ([place])    a. Sickle cell trait.  . Sleep apnea   . Stroke ([place]) 09/2015   R-MCA, L-MCA, PCA and bilateral cerebellar complicated by DVT/PE  . Subdural hematoma ([place]) 05/2019    Past Surgical History:  Procedure Laterality Date  . AV FISTULA PLACEMENT Right 12/05/2015   Procedure: INSERTION OF ARTERIOVENOUS (AV) GORE-TEX GRAFT ARM;  Surgeon: Elam Dutch, MD;   Location: [facility];  Service: Vascular;  Laterality: Right;  . BONE MARROW BIOPSY    . CATARACT EXTRACTION  08/2019  . CATARACT EXTRACTION  09/2019  . CHOLECYSTECTOMY    . CHOLECYSTECTOMY  1990's?  . COLONOSCOPY WITH PROPOFOL N/A 01/22/2017   Procedure: COLONOSCOPY WITH PROPOFOL;  Surgeon: Wilford Corner, MD;  Location: [facility];  Service: Endoscopy;  Laterality: N/A;  . EYE SURGERY Right   . INSERTION OF DIALYSIS CATHETER Right 10/16/2015   Procedure: INSERTION OF PALINDROME DIALYSIS CATHETER ;  Surgeon: Elam Dutch, MD;  Location: [facility];  Service: Vascular;  Laterality: Right;  . OTHER SURGICAL HISTORY     Retinal surgery  . PARS PLANA VITRECTOMY  02/17/2012   Procedure: PARS PLANA VITRECTOMY WITH 25 GAUGE;  Surgeon: Hayden Pedro, MD;  Location: [facility];  Service: Ophthalmology;  Laterality: Right;  . PERIPHERAL VASCULAR CATHETERIZATION N/A 03/20/2016   Procedure: A/V Shuntogram/Fistulagram;  Surgeon: Conrad Reno, MD;  Location: [facility];  Service: Cardiovascular;  Laterality: N/A;  . TEE WITHOUT CARDIOVERSION N/A 12/02/2016   Procedure: TRANSESOPHAGEAL ECHOCARDIOGRAM (TEE);  Surgeon: Larey Dresser, MD;  Location: [facility];  Service: Cardiovascular;  Laterality: N/A;    There were no vitals filed for this visit.   Subjective Assessment -  07/10/20 1619    Subjective  Pt denies any pain. Pt does not have his glasses today and reports they are getting repaired currently. Pt reports that splint wear is going well. Pt is now wearing splint overnight with no redness, soreness or pressure spots.    Patient is accompanied by: Family member   spouse   Pertinent History multiple myeloma (currently undergoing chemo treatment), DM, CVA, DVT, seizure disorder, SDH, ESRD on dialysis, and history of SDH x 2    Limitations Fall. No Driving. RUE fistula-dialysis; hx of seizures    Patient Stated Goals write better with his right hand, pick things up better with R hand    Currently  in Pain? No/denies                        OT Treatments/Exercises (OP) - 07/10/20 1621      ADLs   ADL Comments opening containers of varying sizes and type with increased time and min difficulty      Cognitive Exercises   Other Cognitive Exercises 1 sorting Blink cards based on color, shape and number - switching categories and visual scanning and working on RUE cooridnation with flipping cards. Mod to max verbal and visual cues for category after switching      Fine Motor Coordination (Hand/Wrist)   Fine Motor Coordination Manipulation of small objects;Flipping cards    Manipulation of small objects bimanual coordination with stringling large beads - increased time but no drops and with little difficulty with use of RUE     Flipping cards mod verbal and visual cues for positioning of hands and fingers for flipping cards for sorting BLink cards                    OT Short Term Goals - 07/05/20 1517      OT SHORT TERM GOAL #1   Title Patient and caregiver will be independent with HEP 07/12/20    Time 4    Period Weeks    Status On-going    Target Date 07/12/20      OT SHORT TERM GOAL #2   Title Patient will perform UB dressing with min A in order to increase independence with ADLs    Time 4    Period Weeks    Status New      OT SHORT TERM GOAL #3   Title Patient will demonstrate improved function use of RUE as evidenced by increasing Box and Blocks score to 12 blocks or more.    Baseline RUE 9    Time 4    Period Weeks    Status New      OT SHORT TERM GOAL #4   Title Pt will improve RUE grip strength by at least 3 lbs or more in order to manage clothing with more independence with toileting    Time 4    Period Weeks    Status New      OT SHORT TERM GOAL #5   Title Pt will perform all aspects of toileting with min A consistently    Time 4    Period Weeks    Status New      OT SHORT TERM GOAL #6   Title Pt and spouse will report patient  utilizing RUE for functional tasks 15% of the time in daily activities.    Time 4    Period Weeks    Status On-going  OT Long Term Goals - 06/14/20 1808      OT LONG TERM GOAL #1   Title Pt will be independent with updated HEP 08/09/2020    Time 8    Period Weeks    Status New    Target Date 08/09/20      OT LONG TERM GOAL #2   Title Pt will improve RUE shoulder flexion to 110 degrees in order to obtain lightweight object from overhead cabinet.    Time 8    Period Weeks    Status New      OT LONG TERM GOAL #3   Title Patient will demonstrate improved function use of RUE as evidenced by increasing Box and Blocks score to 17 blocks or more.    Time 8    Period Weeks    Status New      OT LONG TERM GOAL #4   Title Patient will perform UB dressing with supervision with verbal cues in order to increase indepedence with ADLs.    Time 8    Period Weeks    Status New      OT LONG TERM GOAL #5   Title Pt will improve RUE grip strength by at least 6 lbs in order to manage clothing with more independence with toileting    Time 8    Period Weeks    Status New      OT LONG TERM GOAL #6   Title Pt will perform all aspects of toileting with distant supervision for safety in order to increase independence with ADLs.    Time 8    Period Weeks    Status New      OT LONG TERM GOAL #7   Title Pt and spouse will report patient utilizing RUE for functional tasks 25% of the time in daily activities.    Time 8    Period Weeks    Status New                 Plan - 07/10/20 1630    Clinical Impression Statement Pt is progressing towards goals. Continued need for increase visualspatial awareness, RUE coordination and increase independence with ADLs.    OT Occupational Profile and History Detailed Assessment- Review of Records and additional review of physical, cognitive, psychosocial history related to current functional performance    Occupational performance  deficits (Please refer to evaluation for details): IADL's;ADL's;Leisure;Social Participation    Body Structure / Function / Physical Skills ADL;Decreased knowledge of use of DME;Gait;Strength;Tone;GMC;Dexterity;Balance;Body mechanics;Proprioception;UE functional use;ROM;IADL;Coordination;FMC;Vision;Flexibility;Mobility    Cognitive Skills Attention;Problem Solve;Safety Awareness;Sequencing;Energy/Drive;Thought;Learn;Memory;Understand;Perception    Rehab Potential Good    Clinical Decision Making Limited treatment options, no task modification necessary    Comorbidities Affecting Occupational Performance: May have comorbidities impacting occupational performance    Modification or Assistance to Complete Evaluation  No modification of tasks or assist necessary to complete eval    OT Frequency 2x / week    OT Duration 8 weeks    OT Treatment/Interventions Self-care/ADL training;Moist Heat;Fluidtherapy;DME and/or AE instruction;Balance training;Gait Training;Therapeutic activities;Cognitive remediation/compensation;Therapeutic exercise;Ultrasound;Neuromuscular education;Passive range of motion;Visual/perceptual remediation/compensation;Functional Mobility Training;Electrical Stimulation;Energy conservation;Manual Therapy;Patient/family education    Plan functional mobility, RUE reach    Recommended Other Services Recommended ST referral - spouse asked to wait.    Consulted and Agree with Plan of Care Patient;Family member/caregiver    Family Member Consulted mom           Patient will benefit from skilled therapeutic intervention in order to improve the  following deficits and impairments:   Body Structure / Function / Physical Skills: ADL, Decreased knowledge of use of DME, Gait, Strength, Tone, GMC, Dexterity, Balance, Body mechanics, Proprioception, UE functional use, ROM, IADL, Coordination, FMC, Vision, Flexibility, Mobility Cognitive Skills: Attention, Problem Solve, Safety Awareness,  Sequencing, Energy/Drive, Thought, Learn, Memory, Understand, Perception     Visit Diagnosis: Other lack of coordination  Visuospatial deficit  Frontal lobe and executive function deficit  Stiffness of right wrist, not elsewhere classified  Unsteadiness on feet  Muscle weakness (generalized)  Attention and concentration deficit  Other abnormalities of gait and mobility    Problem List Patient Active Problem List   Diagnosis Date Noted  . Colitis 03/15/2020  . AMS (altered mental status) 10/05/2019  . Acute metabolic encephalopathy 58/52/7782  . Headache, unspecified 09/28/2019  . Abnormality of gait 09/13/2019  . Diabetes mellitus with end-stage renal disease ([place]) 08/07/2019  . Benign hypertensive kidney disease with chronic kidney disease 08/07/2019  . Hypothyroidism, unspecified 07/15/2019  . Spastic hemiparesis ([place]) 07/12/2019  . Dysphagia, oropharyngeal phase 07/05/2019  . Allergy, unspecified, initial encounter 06/27/2019  . PAF (paroxysmal atrial fibrillation) ([place])   . Bacteremia   . Labile blood pressure   . Labile blood glucose   . Controlled type 2 diabetes mellitus with hyperglycemia, without long-term current use of insulin ([place])   . ESRD on dialysis ([place])   . Right sided weakness   . SDH (subdural hematoma) ([place])   . Chronic intracranial subdural hematoma ([facility]) 06/09/2019  . Multiple myeloma not having achieved remission ([place]) 06/09/2019  . ESRD on hemodialysis ([place]) 06/09/2019  . Essential hypertension 06/09/2019  . DM type 2 (diabetes mellitus, type 2) ([place]) 06/09/2019  . Seizure disorder ([place]) 06/09/2019  . Aspiration pneumonia ([place]) 06/09/2019  . History of bacteremia 06/09/2019  . Atrial fibrillation, chronic ([place]) 06/09/2019  . Bilateral kidney stones 05/31/2019  . Proteus infection 05/28/2019  . History of restrictive pulmonary disease 04/07/2019  . Multiple myeloma ([place]) 05/17/2018  . Goals of care, counseling/discussion 05/17/2018  .  Partial idiopathic epilepsy with seizures of localized onset, intractable, without status epilepticus ([place]) 11/02/2017  . Iron deficiency anemia, unspecified 01/22/2017  . COPD (chronic obstructive pulmonary disease) ([place]) 11/11/2016  . CVA (cerebral vascular accident) ([place]) 11/11/2016  . GERD (gastroesophageal reflux disease) 11/11/2016  . GI bleed 11/11/2016  . 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  . Obesity (BMI 30-39.9) 11/11/2016  . Pulmonary hypertension ([place]) 11/11/2016  . Patent foramen ovale 11/11/2016  . Hyperparathyroidism, secondary renal ([place]) 11/11/2016  . VTE (venous thromboembolism) 11/11/2016  . Hypotension 11/11/2016  . Systemic hypertension 11/11/2016  . Seizure ([place]) 11/11/2016  . Other seizures ([place]) 07/25/2016  . Encounter for removal of sutures 05/27/2016  . Ingrown nail 03/03/2016  . Onychomycosis due to dermatophyte 03/03/2016  . Other acquired hammer toe 03/03/2016  . Hemiparesis affecting right side as late effect of cerebrovascular accident ([place]) 02/28/2016  . Aphasia complicating stroke 42/35/3614  . Seizures ([place]) 01/08/2016  . History of ischemic left MCA stroke 01/08/2016  . Right sided weakness 01/08/2016  . Atrial fibrillation ([place]) 01/08/2016  . Leukocytosis 01/08/2016  . Benign essential HTN 01/08/2016  . Anemia of chronic disease 01/08/2016  . Controlled type 2 diabetes mellitus with diabetic nephropathy, without long-term current use of insulin ([place]) 01/08/2016  . History of DVT (deep vein thrombosis) 01/08/2016  . 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 Hypocalcemia 12/26/2015  . Long term (current) use of anticoagulants 11/07/2015  . Aftercare including intermittent  dialysis ([place]) 11/02/2015  . Other specified coagulation defects ([place]) 11/02/2015  . Complication of vascular dialysis catheter 11/02/2015  . Diarrhea, unspecified 11/02/2015  . Fever, unspecified 11/02/2015  . Pruritus, unspecified 11/02/2015  . Shortness of  breath 11/02/2015  . ESRD (end stage renal disease) on dialysis [facility])     Zachery Conch MOT, OTR/L  07/10/2020, 5:54 PM  [facility] [address] [place], [place], [address] Phone: [phone]   Fax:  [phone]  Name: Purvis Sidle [facility] MRN: [id number] Date of Birth: May 24, 1966

## 2020-07-11 ENCOUNTER — Telehealth: Payer: Self-pay | Admitting: Oncology

## 2020-07-11 DIAGNOSIS — N186 End stage renal disease: Secondary | ICD-10-CM | POA: Diagnosis not present

## 2020-07-11 DIAGNOSIS — E1129 Type 2 diabetes mellitus with other diabetic kidney complication: Secondary | ICD-10-CM | POA: Diagnosis not present

## 2020-07-11 DIAGNOSIS — D631 Anemia in chronic kidney disease: Secondary | ICD-10-CM | POA: Diagnosis not present

## 2020-07-11 DIAGNOSIS — N2581 Secondary hyperparathyroidism of renal origin: Secondary | ICD-10-CM | POA: Diagnosis not present

## 2020-07-11 DIAGNOSIS — Z23 Encounter for immunization: Secondary | ICD-10-CM | POA: Diagnosis not present

## 2020-07-11 DIAGNOSIS — Z992 Dependence on renal dialysis: Secondary | ICD-10-CM | POA: Diagnosis not present

## 2020-07-11 NOTE — Telephone Encounter (Signed)
Scheduled per los, patient has been called and notified. 

## 2020-07-12 ENCOUNTER — Ambulatory Visit: Payer: Medicare Other | Admitting: Physical Therapy

## 2020-07-12 ENCOUNTER — Other Ambulatory Visit: Payer: Self-pay

## 2020-07-12 ENCOUNTER — Encounter: Payer: Self-pay | Admitting: Physical Therapy

## 2020-07-12 ENCOUNTER — Ambulatory Visit: Payer: Medicare Other | Admitting: Occupational Therapy

## 2020-07-12 DIAGNOSIS — R2681 Unsteadiness on feet: Secondary | ICD-10-CM

## 2020-07-12 DIAGNOSIS — R278 Other lack of coordination: Secondary | ICD-10-CM

## 2020-07-12 DIAGNOSIS — R41842 Visuospatial deficit: Secondary | ICD-10-CM | POA: Diagnosis not present

## 2020-07-12 DIAGNOSIS — M6281 Muscle weakness (generalized): Secondary | ICD-10-CM

## 2020-07-12 DIAGNOSIS — R2689 Other abnormalities of gait and mobility: Secondary | ICD-10-CM | POA: Diagnosis not present

## 2020-07-12 DIAGNOSIS — R41844 Frontal lobe and executive function deficit: Secondary | ICD-10-CM | POA: Diagnosis not present

## 2020-07-12 DIAGNOSIS — R41840 Attention and concentration deficit: Secondary | ICD-10-CM

## 2020-07-12 DIAGNOSIS — M25631 Stiffness of right wrist, not elsewhere classified: Secondary | ICD-10-CM | POA: Diagnosis not present

## 2020-07-12 NOTE — Patient Instructions (Signed)
SELF ASSISTED WITH OBJECT: Shoulder Flexion - Supine    Hold cane with both hands. Raise arms overhead, keep elbows straight. 10_ reps per set, 2-3 sets per day, _5_ days per week  Cane Horizontal - Supine    With straight arms holding cane above shoulders, bring cane out to right, center, out to left, and back to above head. Repeat 10_ times. Do 2-3_ times per day.  Cane Exercise: External Rotation    Lie with elbows on surface, even with shoulders. Hold cane above chest, palms toward toes. Lower arms back as far as possible. Keep elbows on surface. Hold ____ seconds. Repeat _10_ times. Do _2-3_ sessions per day.  [email or web address].exer.[email or web address]     Copyright  VHI. All rights reserved.

## 2020-07-12 NOTE — Therapy (Signed)
[facility] [address] [place], [place], [address] Phone: [phone]   Fax:  [phone]  Occupational Therapy Treatment  Patient Details  Name: Isaiah [name] [facility] MRN: [id number] Date of Birth: 01-11-1966 Referring Provider (OT): Posey Pronto, Ankit A.   Encounter Date: 07/12/2020   OT End of Session - 07/12/20 1544    Visit Number 8    Number of Visits 17    Date for OT Re-Evaluation 08/09/20    Authorization Type Medicare    Authorization Time Period OOP has been met. No financial responsibility.    Authorization - Visit Number 8    Authorization - Number of Visits 10    Progress Note Due on Visit 10    OT Start Time 1962    OT Stop Time 1530    OT Time Calculation (min) 43 min    Activity Tolerance Patient tolerated treatment well    Behavior During Therapy WFL for tasks assessed/performed           Past Medical History:  Diagnosis Date  . A-fib ([place])   . Anemia   . Asthma   . DM type 2 (diabetes mellitus, type 2) ([place]) 06/09/2019  . ESRD (end stage renal disease) on dialysis ([place])   . Essential hypertension 06/09/2019  . GIB (gastrointestinal bleeding)    Recurrent episodes- 09/2014, 09/2015 and 05/2016  . Gout   . History of recent blood transfusion 10/27/14   2 Units PRBC's  . Hyperkalemia 07/2011  . Multiple myeloma ([place])   . OSA on CPAP   . Pulmonary embolism ([place]) 07/2011; 09/27/2014   a. Bilat PE 07/2011 - unclear cause, tx with 6 months Coumadin.;   . Seizure disorder ([place]) 06/09/2019  . Sepsis ([place])   . Sickle cell-thalassemia disease ([place])    a. Sickle cell trait.  . Sleep apnea   . Stroke ([place]) 09/2015   R-MCA, L-MCA, PCA and bilateral cerebellar complicated by DVT/PE  . Subdural hematoma ([place]) 05/2019    Past Surgical History:  Procedure Laterality Date  . AV FISTULA PLACEMENT Right 12/05/2015   Procedure: INSERTION OF ARTERIOVENOUS (AV) GORE-TEX GRAFT ARM;  Surgeon: Elam Dutch, MD;   Location: [facility];  Service: Vascular;  Laterality: Right;  . BONE MARROW BIOPSY    . CATARACT EXTRACTION  08/2019  . CATARACT EXTRACTION  09/2019  . CHOLECYSTECTOMY    . CHOLECYSTECTOMY  1990's?  . COLONOSCOPY WITH PROPOFOL N/A 01/22/2017   Procedure: COLONOSCOPY WITH PROPOFOL;  Surgeon: Wilford Corner, MD;  Location: [facility];  Service: Endoscopy;  Laterality: N/A;  . EYE SURGERY Right   . INSERTION OF DIALYSIS CATHETER Right 10/16/2015   Procedure: INSERTION OF PALINDROME DIALYSIS CATHETER ;  Surgeon: Elam Dutch, MD;  Location: [facility];  Service: Vascular;  Laterality: Right;  . OTHER SURGICAL HISTORY     Retinal surgery  . PARS PLANA VITRECTOMY  02/17/2012   Procedure: PARS PLANA VITRECTOMY WITH 25 GAUGE;  Surgeon: Hayden Pedro, MD;  Location: [facility];  Service: Ophthalmology;  Laterality: Right;  . PERIPHERAL VASCULAR CATHETERIZATION N/A 03/20/2016   Procedure: A/V Shuntogram/Fistulagram;  Surgeon: Conrad [place], MD;  Location: [facility];  Service: Cardiovascular;  Laterality: N/A;  . TEE WITHOUT CARDIOVERSION N/A 12/02/2016   Procedure: TRANSESOPHAGEAL ECHOCARDIOGRAM (TEE);  Surgeon: Larey Dresser, MD;  Location: [facility];  Service: Cardiovascular;  Laterality: N/A;    There were no vitals filed for this visit.   Subjective Assessment -  07/12/20 1452    Subjective  Pt denies pain and reports no falls.    Patient is accompanied by: Family member   spouse   Pertinent History multiple myeloma (currently undergoing chemo treatment), DM, CVA, DVT, seizure disorder, SDH, ESRD on dialysis, and history of SDH x 2    Limitations Fall. No Driving. RUE fistula-dialysis; hx of seizures    Patient Stated Goals write better with his right hand, pick things up better with R hand    Currently in Pain? No/denies                        OT Treatments/Exercises (OP) - 07/12/20 1509      Transfers   Sit to Stand 4: Min guard   verbal cues required for hand  positioning   Sit to Stand Details (indicate cue type and reason) cues for hand positioning    Comments ambulation with gait belt and RW      ADLs   Eating pt reports utilizing RUE for eating more and using a plastic spoon. Pt simulated feeding self with RUE. OT provided verbal and visual cues for holding head up and bringing hand to mouth instead of head down to hand.    UB Dressing min A for donning tshirt with assistance over elbow on RUE and pulling down shirt in back    LB Dressing increased time and verbal cues for orientation of shorts      Neurological Re-education Exercises   Other Exercises 1 supine cane exercises see pt instructions    Development of Reach Ball stretches    Ball Stretches x 10 seated EOM                    OT Short Term Goals - 07/12/20 1505      OT SHORT TERM GOAL #1   Title Patient and caregiver will be independent with HEP 07/12/20    Time 4    Period Weeks    Status On-going    Target Date 07/12/20      OT SHORT TERM GOAL #2   Title Patient will perform UB dressing with min A in order to increase independence with ADLs    Time 4    Period Weeks    Status On-going      OT SHORT TERM GOAL #3   Title Patient will demonstrate improved function use of RUE as evidenced by increasing Box and Blocks score to 12 blocks or more.    Baseline RUE 9    Time 4    Period Weeks    Status New      OT SHORT TERM GOAL #4   Title Pt will improve RUE grip strength by at least 3 lbs or more in order to manage clothing with more independence with toileting    Baseline 22.4    Time 4    Period Weeks    Status On-going   23.1     OT SHORT TERM GOAL #5   Title Pt will perform all aspects of toileting with min A consistently    Time 4    Period Weeks    Status New      OT SHORT TERM GOAL #6   Title Pt and spouse will report patient utilizing RUE for functional tasks 15% of the time in daily activities.    Time 4    Period Weeks    Status On-going  OT Long Term Goals - 06/14/20 1808      OT LONG TERM GOAL #1   Title Pt will be independent with updated HEP 08/09/2020    Time 8    Period Weeks    Status New    Target Date 08/09/20      OT LONG TERM GOAL #2   Title Pt will improve RUE shoulder flexion to 110 degrees in order to obtain lightweight object from overhead cabinet.    Time 8    Period Weeks    Status New      OT LONG TERM GOAL #3   Title Patient will demonstrate improved function use of RUE as evidenced by increasing Box and Blocks score to 17 blocks or more.    Time 8    Period Weeks    Status New      OT LONG TERM GOAL #4   Title Patient will perform UB dressing with supervision with verbal cues in order to increase indepedence with ADLs.    Time 8    Period Weeks    Status New      OT LONG TERM GOAL #5   Title Pt will improve RUE grip strength by at least 6 lbs in order to manage clothing with more independence with toileting    Time 8    Period Weeks    Status New      OT LONG TERM GOAL #6   Title Pt will perform all aspects of toileting with distant supervision for safety in order to increase independence with ADLs.    Time 8    Period Weeks    Status New      OT LONG TERM GOAL #7   Title Pt and spouse will report patient utilizing RUE for functional tasks 25% of the time in daily activities.    Time 8    Period Weeks    Status New                 Plan - 07/12/20 1536    Clinical Impression Statement Pt continues to progress towards goals. Pt with good carryover of UB and LB dresing techniques this day.    OT Occupational Profile and History Detailed Assessment- Review of Records and additional review of physical, cognitive, psychosocial history related to current functional performance    Occupational performance deficits (Please refer to evaluation for details): IADL's;ADL's;Leisure;Social Participation    Body Structure / Function / Physical Skills ADL;Decreased knowledge  of use of DME;Gait;Strength;Tone;GMC;Dexterity;Balance;Body mechanics;Proprioception;UE functional use;ROM;IADL;Coordination;FMC;Vision;Flexibility;Mobility    Cognitive Skills Attention;Problem Solve;Safety Awareness;Sequencing;Energy/Drive;Thought;Learn;Memory;Understand;Perception    Rehab Potential Good    Clinical Decision Making Limited treatment options, no task modification necessary    Comorbidities Affecting Occupational Performance: May have comorbidities impacting occupational performance    Modification or Assistance to Complete Evaluation  No modification of tasks or assist necessary to complete eval    OT Frequency 2x / week    OT Duration 8 weeks    OT Treatment/Interventions Self-care/ADL training;Moist Heat;Fluidtherapy;DME and/or AE instruction;Balance training;Gait Training;Therapeutic activities;Cognitive remediation/compensation;Therapeutic exercise;Ultrasound;Neuromuscular education;Passive range of motion;Visual/perceptual remediation/compensation;Functional Mobility Training;Electrical Stimulation;Energy conservation;Manual Therapy;Patient/family education    Plan functional mobility, RUE reach    Recommended Other Services Recommended ST referral - spouse asked to wait.    Consulted and Agree with Plan of Care Patient;Family member/caregiver    Family Member Consulted mom           Patient will benefit from skilled therapeutic intervention in order to improve the  following deficits and impairments:   Body Structure / Function / Physical Skills: ADL, Decreased knowledge of use of DME, Gait, Strength, Tone, GMC, Dexterity, Balance, Body mechanics, Proprioception, UE functional use, ROM, IADL, Coordination, FMC, Vision, Flexibility, Mobility Cognitive Skills: Attention, Problem Solve, Safety Awareness, Sequencing, Energy/Drive, Thought, Learn, Memory, Understand, Perception     Visit Diagnosis: Other lack of coordination  Frontal lobe and executive function deficit   Unsteadiness on feet  Attention and concentration deficit  Muscle weakness (generalized)    Problem List Patient Active Problem List   Diagnosis Date Noted  . Colitis 03/15/2020  . AMS (altered mental status) 10/05/2019  . Acute metabolic encephalopathy 17/61/6073  . Headache, unspecified 09/28/2019  . Abnormality of gait 09/13/2019  . Diabetes mellitus with end-stage renal disease ([place]) 08/07/2019  . Benign hypertensive kidney disease with chronic kidney disease 08/07/2019  . Hypothyroidism, unspecified 07/15/2019  . Spastic hemiparesis ([place]) 07/12/2019  . Dysphagia, oropharyngeal phase 07/05/2019  . Allergy, unspecified, initial encounter 06/27/2019  . PAF (paroxysmal atrial fibrillation) ([place])   . Bacteremia   . Labile blood pressure   . Labile blood glucose   . Controlled type 2 diabetes mellitus with hyperglycemia, without long-term current use of insulin ([place])   . ESRD on dialysis ([place])   . Right sided weakness   . SDH (subdural hematoma) ([place])   . Chronic intracranial subdural hematoma ([facility]) 06/09/2019  . Multiple myeloma not having achieved remission ([place]) 06/09/2019  . ESRD on hemodialysis ([place]) 06/09/2019  . Essential hypertension 06/09/2019  . DM type 2 (diabetes mellitus, type 2) ([place]) 06/09/2019  . Seizure disorder ([place]) 06/09/2019  . Aspiration pneumonia ([place]) 06/09/2019  . History of bacteremia 06/09/2019  . Atrial fibrillation, chronic ([place]) 06/09/2019  . Bilateral kidney stones 05/31/2019  . Proteus infection 05/28/2019  . History of restrictive pulmonary disease 04/07/2019  . Multiple myeloma ([place]) 05/17/2018  . Goals of care, counseling/discussion 05/17/2018  . Partial idiopathic epilepsy with seizures of localized onset, intractable, without status epilepticus ([place]) 11/02/2017  . Iron deficiency anemia, unspecified 01/22/2017  . COPD (chronic obstructive pulmonary disease) ([place]) 11/11/2016  . CVA (cerebral vascular accident) ([place]) 11/11/2016  .  GERD (gastroesophageal reflux disease) 11/11/2016  . GI bleed 11/11/2016  . 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  . Obesity (BMI 30-39.9) 11/11/2016  . Pulmonary hypertension ([place]) 11/11/2016  . Patent foramen ovale 11/11/2016  . Hyperparathyroidism, secondary renal ([place]) 11/11/2016  . VTE (venous thromboembolism) 11/11/2016  . Hypotension 11/11/2016  . Systemic hypertension 11/11/2016  . Seizure ([place]) 11/11/2016  . Other seizures ([place]) 07/25/2016  . Encounter for removal of sutures 05/27/2016  . Ingrown nail 03/03/2016  . Onychomycosis due to dermatophyte 03/03/2016  . Other acquired hammer toe 03/03/2016  . Hemiparesis affecting right side as late effect of cerebrovascular accident ([place]) 02/28/2016  . Aphasia complicating stroke 71/02/2693  . Seizures ([place]) 01/08/2016  . History of ischemic left MCA stroke 01/08/2016  . Right sided weakness 01/08/2016  . Atrial fibrillation ([place]) 01/08/2016  . Leukocytosis 01/08/2016  . Benign essential HTN 01/08/2016  . Anemia of chronic disease 01/08/2016  . Controlled type 2 diabetes mellitus with diabetic nephropathy, without long-term current use of insulin ([place]) 01/08/2016  . History of DVT (deep vein thrombosis) 01/08/2016  . 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 Hypocalcemia 12/26/2015  . Long term (current) use of anticoagulants 11/07/2015  . Aftercare including intermittent dialysis ([place]) 11/02/2015  . Other specified coagulation defects ([place]) 11/02/2015  . Complication of vascular dialysis catheter  11/02/2015  . Diarrhea, unspecified 11/02/2015  . Fever, unspecified 11/02/2015  . Pruritus, unspecified 11/02/2015  . Shortness of breath 11/02/2015  . ESRD (end stage renal disease) on dialysis [name])     Zachery Conch MOT, OTR/L  07/12/2020, 3:45 PM  [facility] [address] [place], [place], [address] Phone: [phone]   Fax:  [phone]   Name: Isaiah [name] [facility] MRN: [id number] Date of Birth: 1966-01-24

## 2020-07-13 DIAGNOSIS — N2581 Secondary hyperparathyroidism of renal origin: Secondary | ICD-10-CM | POA: Diagnosis not present

## 2020-07-13 DIAGNOSIS — Z992 Dependence on renal dialysis: Secondary | ICD-10-CM | POA: Diagnosis not present

## 2020-07-13 DIAGNOSIS — E1129 Type 2 diabetes mellitus with other diabetic kidney complication: Secondary | ICD-10-CM | POA: Diagnosis not present

## 2020-07-13 DIAGNOSIS — D631 Anemia in chronic kidney disease: Secondary | ICD-10-CM | POA: Diagnosis not present

## 2020-07-13 DIAGNOSIS — N186 End stage renal disease: Secondary | ICD-10-CM | POA: Diagnosis not present

## 2020-07-13 DIAGNOSIS — Z23 Encounter for immunization: Secondary | ICD-10-CM | POA: Diagnosis not present

## 2020-07-13 NOTE — Therapy (Addendum)
[facility] [address] [place], [place], [address] Phone: [phone]   Fax:  [phone]  Physical Therapy Treatment/10th Visit Progress Note  Patient Details  Name: Isaiah [name] [name] MRN: [id number] Date of Birth: 12-03-1965 Referring Provider (PT): Delice Lesch, MD   Progress Note: Covering dates 05/31/2020-07/12/2020.  Subjective reports:  Pt really wants to walk without the walker.  Objective measures:  Most recent gait velocity with RW:  1.46 ft/sec.  Pt ambulates 330 ft with RW with min guard.  Recent attempts at cane in session based on pt's question to try; PT had to provide min/mod assist with cane, with pt very unsteady and explained to pt and mother cane is not a safe option at this time.  Pt/mother verbalized understanding to keep using RW.  Pt met 1 of 2 STGs.  He is progressing towards LTGs and will benefit from continued skilled therapy, as he remains at fall risk and continues to demo decreased strength and balance.  Will assess LTGs in several weeks and discuss plans for recert/discharge based on progress.  With pt having dialysis and extensive medical history, progress is slow, but pt remains motivated for therapy.   Mady Haagensen, PT 07/19/20 7:20 PM Phone: [phone] Fax: [phone]   Encounter Date: 07/12/2020   PT End of Session - 07/12/20 1533    Visit Number 20    Number of Visits 27   per recent recert wk of 9/62   Date for PT Re-Evaluation 08/30/20    Authorization Type Medicare; PN/Recert 8/36/62    Progress Note Due on Visit 30    PT Start Time 1531    PT Stop Time 1615    PT Time Calculation (min) 44 min    Equipment Utilized During Treatment Gait belt    Activity Tolerance Patient tolerated treatment well    Behavior During Therapy WFL for tasks assessed/performed           Past Medical History:  Diagnosis Date  . A-fib ([place])   . Anemia   . Asthma   . DM type 2 (diabetes mellitus,  type 2) ([place]) 06/09/2019  . ESRD (end stage renal disease) on dialysis ([place])   . Essential hypertension 06/09/2019  . GIB (gastrointestinal bleeding)    Recurrent episodes- 09/2014, 09/2015 and 05/2016  . Gout   . History of recent blood transfusion 10/27/14   2 Units PRBC's  . Hyperkalemia 07/2011  . Multiple myeloma ([place])   . OSA on CPAP   . Pulmonary embolism ([place]) 07/2011; 09/27/2014   a. Bilat PE 07/2011 - unclear cause, tx with 6 months Coumadin.;   . Seizure disorder ([place]) 06/09/2019  . Sepsis ([place])   . Sickle cell-thalassemia disease ([place])    a. Sickle cell trait.  . Sleep apnea   . Stroke ([place]) 09/2015   R-MCA, L-MCA, PCA and bilateral cerebellar complicated by DVT/PE  . Subdural hematoma ([place]) 05/2019    Past Surgical History:  Procedure Laterality Date  . AV FISTULA PLACEMENT Right 12/05/2015   Procedure: INSERTION OF ARTERIOVENOUS (AV) GORE-TEX GRAFT ARM;  Surgeon: Elam Dutch, MD;  Location: [facility];  Service: Vascular;  Laterality: Right;  . BONE MARROW BIOPSY    . CATARACT EXTRACTION  08/2019  . CATARACT EXTRACTION  09/2019  . CHOLECYSTECTOMY    . CHOLECYSTECTOMY  1990's?  . COLONOSCOPY WITH PROPOFOL N/A 01/22/2017   Procedure: COLONOSCOPY WITH PROPOFOL;  Surgeon: Wilford Corner, MD;  Location: [facility];  Service:  Endoscopy;  Laterality: N/A;  . EYE SURGERY Right   . INSERTION OF DIALYSIS CATHETER Right 10/16/2015   Procedure: INSERTION OF PALINDROME DIALYSIS CATHETER ;  Surgeon: Elam Dutch, MD;  Location: [facility];  Service: Vascular;  Laterality: Right;  . OTHER SURGICAL HISTORY     Retinal surgery  . PARS PLANA VITRECTOMY  02/17/2012   Procedure: PARS PLANA VITRECTOMY WITH 25 GAUGE;  Surgeon: Hayden Pedro, MD;  Location: [facility];  Service: Ophthalmology;  Laterality: Right;  . PERIPHERAL VASCULAR CATHETERIZATION N/A 03/20/2016   Procedure: A/V Shuntogram/Fistulagram;  Surgeon: Conrad Tyndall, MD;  Location: [facility];  Service: Cardiovascular;   Laterality: N/A;  . TEE WITHOUT CARDIOVERSION N/A 12/02/2016   Procedure: TRANSESOPHAGEAL ECHOCARDIOGRAM (TEE);  Surgeon: Larey Dresser, MD;  Location: [facility];  Service: Cardiovascular;  Laterality: N/A;    There were no vitals filed for this visit.   Subjective Assessment - 07/12/20 1532    Subjective No new complaints. No falls or pain to report.    Patient is accompained by: Family member   spouse   Pertinent History multiple myeloma, ESRD on dialysis, DM type II, DVT with IVC filter, CVA, COPD, a-fib; chemo treatments down to 1x/month (next is August 17th)    Patient Stated Goals To get back to walk without the walker, back to his baseline    Currently in Pain? No/denies    Pain Score 0-No pain               [facility] Adult PT Treatment/Exercise - 07/12/20 1534      Transfers   Transfers Sit to Stand;Stand to Sit    Sit to Stand 5: Supervision;With upper extremity assist;From bed;From chair/3-in-1    Sit to Stand Details Verbal cues for technique;Verbal cues for precautions/safety;Verbal cues for safe use of DME/AE    Sit to Stand Details (indicate cue type and reason) reminder cues for hand placement    Stand to Sit 4: Min guard;With upper extremity assist;To bed;To chair/3-in-1    Stand to Sit Details (indicate cue type and reason) Verbal cues for technique;Verbal cues for precautions/safety;Verbal cues for safe use of DME/AE    Stand to Sit Details cues to turn all the way to the surface and to reach back to controll descent       Ambulation/Gait   Ambulation/Gait Yes    Ambulation/Gait Assistance 4: Min guard    Ambulation/Gait Assistance Details around gym with session    Assistive device Rolling walker    Gait Pattern Step-through pattern;Trunk flexed;Poor foot clearance - left;Poor foot clearance - right    Ambulation Surface Level;Indoor      High Level Balance   High Level Balance Activities Side stepping;Marching forwards;Marching backwards;Tandem walking    tandem fwd/bwd   High Level Balance Comments on blue mat in parallel bars: 3 laps each/each way with cues on form and technique. Min guard assist for safety with light support on bars.       Knee/Hip Exercises: Aerobic   Nustep UE/LE's on level 4 for 8 minute with goal 50-55 steps per minute for strengthening and activity tolerance.                Balance Exercises - 07/12/20 1558      Balance Exercises: Standing   Standing Eyes Closed Wide (BOA);Foam/compliant surface;3 reps;30 secs;Limitations    Standing Eyes Closed Limitations on 1 inch foam with feet hip width apart, pt able to self correct posture  due to right lateral lean at times. min guard to min assist due to postura way    Balance Beam standing across red foam beam with bil UE support: mod/max cues needed to task/sequencing of task- had pt perform alternating fwd stepping to floor/back onto beam, then alternating bwd stepping to floor/back onto beam, min guard to min assist for balance.                 PT Short Term Goals - 07/03/20 1239      PT SHORT TERM GOAL #1   Title Pt will perform HEP independenlty for improved strength, balance and gait.    Baseline performs, but needs supervision for safety in standing    Time 4    Period Weeks    Status Partially Met    Target Date 06/28/20      PT SHORT TERM GOAL #2   Title Pt will improve gait velocity to at least 1.3 ft/sec for improved gait efficiency and safety.    Baseline 1.41f/s with RW 05/31/20; 1.46 ft/sec 07/03/2020    Time 4    Period Weeks    Status Achieved    Target Date 06/28/20             PT Long Term Goals - 06/05/20 2150      PT LONG TERM GOAL #1   Title Pt will be independent with progression of HEP for improved strength, blaance, gait.    Time 8    Period Weeks    Status On-going    Target Date 07/26/20      PT LONG TERM GOAL #2   Title Pt will imrpove Berg Balance score to at least 33/56 for decreased fall risk.    Baseline 30/56  05/31/20    Time 8    Period Weeks    Status On-going    Target Date 07/26/20      PT LONG TERM GOAL #3   Title Pt will improve TUG score to less than or equal to 30 seconds for decreased fall risk.    Baseline 37 sec 05/31/20    Time 8    Period Weeks    Status On-going    Target Date 07/26/20      PT LONG TERM GOAL #4   Title Pt will improve gait velocity to at least 1.8 ft/sec for improved gait velocity and decreased fall risk.    Baseline 1.12 ft/s 05/31/20    Time 8    Period Weeks    Status On-going    Target Date 07/26/20      PT LONG TERM GOAL #5   Title Pt will ascend/descent 12 steps with both handrails with supervision only, to negotiate steps at home.    Baseline not attempted 05/31/20    Time 8    Period Weeks    Status On-going    Target Date 07/26/20                 Plan - 07/12/20 1533    Clinical Impression Statement Today's skilled session continued to focus on strengthening and balance reactions on complaint surfaces with up to min assist needed at times. No issues noted or reported in session. The pt is progressing toward goals and should benefit from continued PT to progress toward unmet goals    Personal Factors and Comorbidities Comorbidity 3+    Comorbidities PMH:  multiple myeloma, ESRD on dialysis, DM type II, DVT with IVC filter, CVA, COPD,  a-fib    Examination-Activity Limitations Locomotion Level;Transfers;Stairs;Stand    Examination-Participation Restrictions Community Activity    Stability/Clinical Decision Making Evolving/Moderate complexity    Rehab Potential Good    PT Frequency 2x / week    PT Duration 8 weeks   per recert 4/78/29   PT Treatment/Interventions ADLs/Self Care Home Management;DME Instruction;Neuromuscular re-education;Balance training;Therapeutic exercise;Therapeutic activities;Functional mobility training;Stair training;Gait training;Patient/family education    PT Next Visit Plan Continue gait, obstacle negotiation, SLS,  functional BLE strengthening; standing balance progressing towards LTGs    PT Home Exercise Plan Access Code [email or web address]    Consulted and Agree with Plan of Care Patient;Family member/caregiver    Family Member Consulted mother           Patient will benefit from skilled therapeutic intervention in order to improve the following deficits and impairments:  Abnormal gait, Difficulty walking, Decreased safety awareness, Decreased balance, Decreased mobility, Decreased strength  Visit Diagnosis: Unsteadiness on feet  Muscle weakness (generalized)  Other abnormalities of gait and mobility     Problem List Patient Active Problem List   Diagnosis Date Noted  . Colitis 03/15/2020  . AMS (altered mental status) 10/05/2019  . Acute metabolic encephalopathy 57/84/6962  . Headache, unspecified 09/28/2019  . Abnormality of gait 09/13/2019  . Diabetes mellitus with end-stage renal disease ([place]) 08/07/2019  . Benign hypertensive kidney disease with chronic kidney disease 08/07/2019  . Hypothyroidism, unspecified 07/15/2019  . Spastic hemiparesis ([place]) 07/12/2019  . Dysphagia, oropharyngeal phase 07/05/2019  . Allergy, unspecified, initial encounter 06/27/2019  . PAF (paroxysmal atrial fibrillation) ([place])   . Bacteremia   . Labile blood pressure   . Labile blood glucose   . Controlled type 2 diabetes mellitus with hyperglycemia, without long-term current use of insulin ([place])   . ESRD on dialysis ([place])   . Right sided weakness   . SDH (subdural hematoma) ([place])   . Chronic intracranial subdural hematoma ([facility]) 06/09/2019  . Multiple myeloma not having achieved remission ([place]) 06/09/2019  . ESRD on hemodialysis ([place]) 06/09/2019  . Essential hypertension 06/09/2019  . DM type 2 (diabetes mellitus, type 2) ([place]) 06/09/2019  . Seizure disorder ([place]) 06/09/2019  . Aspiration pneumonia ([place]) 06/09/2019  . History of bacteremia 06/09/2019  . Atrial fibrillation, chronic ([place]) 06/09/2019  .  Bilateral kidney stones 05/31/2019  . Proteus infection 05/28/2019  . History of restrictive pulmonary disease 04/07/2019  . Multiple myeloma ([place]) 05/17/2018  . Goals of care, counseling/discussion 05/17/2018  . Partial idiopathic epilepsy with seizures of localized onset, intractable, without status epilepticus ([place]) 11/02/2017  . Iron deficiency anemia, unspecified 01/22/2017  . COPD (chronic obstructive pulmonary disease) ([place]) 11/11/2016  . CVA (cerebral vascular accident) ([place]) 11/11/2016  . GERD (gastroesophageal reflux disease) 11/11/2016  . GI bleed 11/11/2016  . 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  . Obesity (BMI 30-39.9) 11/11/2016  . Pulmonary hypertension ([place]) 11/11/2016  . Patent foramen ovale 11/11/2016  . Hyperparathyroidism, secondary renal ([place]) 11/11/2016  . VTE (venous thromboembolism) 11/11/2016  . Hypotension 11/11/2016  . Systemic hypertension 11/11/2016  . Seizure ([place]) 11/11/2016  . Other seizures ([place]) 07/25/2016  . Encounter for removal of sutures 05/27/2016  . Ingrown nail 03/03/2016  . Onychomycosis due to dermatophyte 03/03/2016  . Other acquired hammer toe 03/03/2016  . Hemiparesis affecting right side as late effect of cerebrovascular accident ([place]) 02/28/2016  . Aphasia complicating stroke 95/28/4132  . Seizures ([place]) 01/08/2016  . History of ischemic left MCA stroke 01/08/2016  . Right sided weakness 01/08/2016  .  Atrial fibrillation ([place]) 01/08/2016  . Leukocytosis 01/08/2016  . Benign essential HTN 01/08/2016  . Anemia of chronic disease 01/08/2016  . Controlled type 2 diabetes mellitus with diabetic nephropathy, without long-term current use of insulin ([place]) 01/08/2016  . History of DVT (deep vein thrombosis) 01/08/2016  . 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 Hypocalcemia 12/26/2015  . Long term (current) use of anticoagulants 11/07/2015  . Aftercare including intermittent dialysis ([place]) 11/02/2015  . Other specified  coagulation defects ([place]) 11/02/2015  . Complication of vascular dialysis catheter 11/02/2015  . Diarrhea, unspecified 11/02/2015  . Fever, unspecified 11/02/2015  . Pruritus, unspecified 11/02/2015  . Shortness of breath 11/02/2015  . ESRD (end stage renal disease) on dialysis ([place])     Willow Ora, PTA, [facility] [address] [name], [place] [address] [phone] 07/13/20, 4:07 PM   Name: Isaiah [name] [facility] MRN: [id number] Date of Birth: 07-May-1966

## 2020-07-14 DIAGNOSIS — G8110 Spastic hemiplegia affecting unspecified side: Secondary | ICD-10-CM | POA: Diagnosis not present

## 2020-07-14 DIAGNOSIS — R2689 Other abnormalities of gait and mobility: Secondary | ICD-10-CM | POA: Diagnosis not present

## 2020-07-14 DIAGNOSIS — R269 Unspecified abnormalities of gait and mobility: Secondary | ICD-10-CM | POA: Diagnosis not present

## 2020-07-14 DIAGNOSIS — F445 Conversion disorder with seizures or convulsions: Secondary | ICD-10-CM | POA: Diagnosis not present

## 2020-07-14 DIAGNOSIS — G40909 Epilepsy, unspecified, not intractable, without status epilepticus: Secondary | ICD-10-CM | POA: Diagnosis not present

## 2020-07-16 ENCOUNTER — Other Ambulatory Visit: Payer: Self-pay | Admitting: Internal Medicine

## 2020-07-16 DIAGNOSIS — N2581 Secondary hyperparathyroidism of renal origin: Secondary | ICD-10-CM | POA: Diagnosis not present

## 2020-07-16 DIAGNOSIS — Z23 Encounter for immunization: Secondary | ICD-10-CM | POA: Diagnosis not present

## 2020-07-16 DIAGNOSIS — Z992 Dependence on renal dialysis: Secondary | ICD-10-CM | POA: Diagnosis not present

## 2020-07-16 DIAGNOSIS — D631 Anemia in chronic kidney disease: Secondary | ICD-10-CM | POA: Diagnosis not present

## 2020-07-16 DIAGNOSIS — N186 End stage renal disease: Secondary | ICD-10-CM | POA: Diagnosis not present

## 2020-07-16 DIAGNOSIS — E1129 Type 2 diabetes mellitus with other diabetic kidney complication: Secondary | ICD-10-CM | POA: Diagnosis not present

## 2020-07-16 MED FILL — ATORVASTATIN CALCIUM 20 MG: 20 | 30 days supply | Qty: 30 | Fill #0

## 2020-07-17 ENCOUNTER — Encounter: Payer: Self-pay | Admitting: Occupational Therapy

## 2020-07-17 ENCOUNTER — Encounter: Payer: Self-pay | Admitting: Neurology

## 2020-07-17 ENCOUNTER — Other Ambulatory Visit (HOSPITAL_COMMUNITY): Payer: Self-pay | Admitting: Internal Medicine

## 2020-07-17 ENCOUNTER — Encounter: Payer: Self-pay | Admitting: Physical Therapy

## 2020-07-17 ENCOUNTER — Other Ambulatory Visit: Payer: Self-pay

## 2020-07-17 ENCOUNTER — Inpatient Hospital Stay: Payer: Medicare Other

## 2020-07-17 ENCOUNTER — Inpatient Hospital Stay: Payer: Medicare Other | Attending: Oncology

## 2020-07-17 ENCOUNTER — Inpatient Hospital Stay (HOSPITAL_BASED_OUTPATIENT_CLINIC_OR_DEPARTMENT_OTHER): Payer: Medicare Other | Admitting: Oncology

## 2020-07-17 ENCOUNTER — Telehealth: Payer: Self-pay

## 2020-07-17 ENCOUNTER — Ambulatory Visit: Payer: Medicare Other | Admitting: Occupational Therapy

## 2020-07-17 ENCOUNTER — Ambulatory Visit: Payer: Medicare Other | Admitting: Physical Therapy

## 2020-07-17 VITALS — BP 151/67 | HR 78 | Temp 97.0°F | Resp 18 | Wt 190.1 lb

## 2020-07-17 DIAGNOSIS — R2681 Unsteadiness on feet: Secondary | ICD-10-CM

## 2020-07-17 DIAGNOSIS — R41844 Frontal lobe and executive function deficit: Secondary | ICD-10-CM

## 2020-07-17 DIAGNOSIS — R278 Other lack of coordination: Secondary | ICD-10-CM | POA: Diagnosis not present

## 2020-07-17 DIAGNOSIS — C9000 Multiple myeloma not having achieved remission: Secondary | ICD-10-CM

## 2020-07-17 DIAGNOSIS — Z5112 Encounter for antineoplastic immunotherapy: Secondary | ICD-10-CM | POA: Diagnosis not present

## 2020-07-17 DIAGNOSIS — R41842 Visuospatial deficit: Secondary | ICD-10-CM

## 2020-07-17 DIAGNOSIS — M6281 Muscle weakness (generalized): Secondary | ICD-10-CM

## 2020-07-17 DIAGNOSIS — R41840 Attention and concentration deficit: Secondary | ICD-10-CM | POA: Diagnosis not present

## 2020-07-17 DIAGNOSIS — R2689 Other abnormalities of gait and mobility: Secondary | ICD-10-CM | POA: Diagnosis not present

## 2020-07-17 DIAGNOSIS — M25631 Stiffness of right wrist, not elsewhere classified: Secondary | ICD-10-CM | POA: Diagnosis not present

## 2020-07-17 LAB — CBC WITH DIFFERENTIAL (CANCER CENTER ONLY)
Abs Immature Granulocytes: 0.01 10*3/uL (ref 0.00–0.07)
Basophils Absolute: 0.1 10*3/uL (ref 0.0–0.1)
Basophils Relative: 2 %
Eosinophils Absolute: 0.8 10*3/uL — ABNORMAL HIGH (ref 0.0–0.5)
Eosinophils Relative: 11 %
Hemoglobin: 12 g/dL — ABNORMAL LOW (ref 13.0–17.0)
Immature Granulocytes: 0 %
Lymphocytes Relative: 11 %
Lymphs Abs: 0.8 10*3/uL (ref 0.7–4.0)
Monocytes Absolute: 1 10*3/uL (ref 0.1–1.0)
Monocytes Relative: 14 %
Neutro Abs: 4.6 10*3/uL (ref 1.7–7.7)
Neutrophils Relative %: 62 %
Platelet Count: 254 10*3/uL (ref 150–400)
WBC Count: 7.3 10*3/uL (ref 4.0–10.5)

## 2020-07-17 LAB — CMP (CANCER CENTER ONLY)
ALT: 12 U/L (ref 0–44)
AST: 15 U/L (ref 15–41)
Albumin: 4.5 g/dL (ref 3.5–5.0)
Alkaline Phosphatase: 115 U/L (ref 38–126)
Anion gap: 10 (ref 5–15)
BUN: 37 mg/dL — ABNORMAL HIGH (ref 6–20)
CO2: 34 mmol/L — ABNORMAL HIGH (ref 22–32)
Calcium: 9.8 mg/dL (ref 8.9–10.3)
Chloride: 99 mmol/L (ref 98–111)
Creatinine: 8.43 mg/dL (ref 0.61–1.24)
GFR, Estimated: 7 mL/min — ABNORMAL LOW (ref >60)
Glucose, Bld: 126 mg/dL — ABNORMAL HIGH (ref 70–99)
Potassium: 3.8 mmol/L (ref 3.5–5.1)
Sodium: 143 mmol/L (ref 135–145)
Total Bilirubin: 1.4 mg/dL — ABNORMAL HIGH (ref 0.3–1.2)
Total Protein: 7.2 g/dL (ref 6.5–8.1)

## 2020-07-17 MED ORDER — ACETAMINOPHEN 325 MG PO TABS
ORAL_TABLET | ORAL | Status: AC
  Filled 2020-07-17: qty 2

## 2020-07-17 MED ORDER — DEXAMETHASONE 4 MG PO TABS
ORAL_TABLET | ORAL | Status: AC
  Filled 2020-07-17: qty 3

## 2020-07-17 MED ORDER — DIPHENHYDRAMINE HCL 25 MG PO CAPS
ORAL_CAPSULE | ORAL | Status: AC
  Filled 2020-07-17: qty 2

## 2020-07-17 MED ORDER — DIPHENHYDRAMINE HCL 25 MG PO CAPS
50.0000 mg | ORAL_CAPSULE | Freq: Once | ORAL | Status: AC
  Administered 2020-07-17: 50 mg via ORAL

## 2020-07-17 MED ORDER — DEXAMETHASONE 4 MG PO TABS
10.0000 mg | ORAL_TABLET | Freq: Once | ORAL | Status: AC
  Administered 2020-07-17: 10 mg via ORAL

## 2020-07-17 MED ORDER — ACETAMINOPHEN 325 MG PO TABS
650.0000 mg | ORAL_TABLET | Freq: Once | ORAL | Status: AC
  Administered 2020-07-17: 650 mg via ORAL

## 2020-07-17 MED ORDER — DARATUMUMAB-HYALURONIDASE-FIHJ 1800-30000 MG-UT/15ML ~~LOC~~ SOLN
1800.0000 mg | Freq: Once | SUBCUTANEOUS | Status: AC
  Administered 2020-07-17: 1800 mg via SUBCUTANEOUS
  Filled 2020-07-17: qty 15

## 2020-07-17 NOTE — Progress Notes (Signed)
Per Dr. Alen Blew, pt is to receive dexamethasone 21m po prior to treatment today.

## 2020-07-17 NOTE — Progress Notes (Signed)
Hematology and Oncology Follow Up Visit  [place] [id number] Aug 09, 1966 54 y.o. 07/17/2020 10:02 AM    Principle Diagnosis: 54 year old man with multiple myeloma diagnosed in 2019.  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   Past therapy: Velcade dexamethasone and cyclophosphamide weekly started on 05/25/2018.  Velcade is given at 1.5 mg/m weekly, dexamethasone is 20 mg weekly and and Cytoxan is given at 650 mg total dose.  Last treatment was given in August 2020.    Therapy was held today in anticipation of high-dose chemotherapy and autologous stem cell transplant.  He did not receive stem cell transplant due to subdural hematoma and sepsis in September 2020.  Velcade at 1.5 mg per metered squared subcutaneously weekly, dexamethasone 20 mg weekly, daratumumab 1800 mg subcutaneous injection.   Cycle 1 started on 08/30/2019.  Therapy concluded in June 2021.   Current therapy:  Daratumumab monthly maintenance started in June 2021.  He is scheduled for the next cycle of therapy today.  Interim History:  Mr. Mcginness presents today for a follow-up visit. Since the last visit, he reports feeling reasonably fair without any changes. He still unsteady with his mobility and uses a walker and sustained 1 fall since the last visit. He did not have any injuries associated with it. His performance status although limited is improving slowly. He has tolerated daratumumab without any complications. Denies any nausea, vomiting or abdominal pain. He denies any hematochezia or melena.                      Medications: Unchanged on review    Current Outpatient Medications:  .  acetaminophen (TYLENOL) 325 MG tablet, Take 1-2 tablets (325-650 mg total) by mouth every 4 (four) hours as needed for mild pain., Disp:  , Rfl:  .  acyclovir (ZOVIRAX) 400 MG tablet, TAKE 1 TABLET BY MOUTH ONCE DAILY, Disp: 90 tablet, Rfl: 0 .  albuterol (PROAIR HFA) 108 (90 Base) MCG/ACT inhaler, Inhale  two puffs every 4-6 hours if needed for cough or wheeze (Patient taking differently: Inhale 2 puffs into the lungs every 4 (four) hours as needed for wheezing or shortness of breath. Inhale two puffs every 4-6 hours if needed for cough or wheeze), Disp: 6.7 g, Rfl: 2 .  Alcohol Swabs (SM ALCOHOL PREP) 70 % PADS, , Disp: , Rfl:  .  atorvastatin (LIPITOR) 20 MG tablet, TAKE 1 TABLET BY MOUTH ONCE A DAY, Disp: 30 tablet, Rfl: 0 .  AURYXIA 1 GM 210 MG(Fe) tablet, Take 210 mg by mouth 3 (three) times daily., Disp: , Rfl:  .  BREO ELLIPTA 200-25 MCG/INH AEPB, INHALE 1 PUFF INTO THE LUNGS AT THE SAME TIME EVERY DAY, Disp: 60 each, Rfl: 2 .  brimonidine (ALPHAGAN) 0.2 % ophthalmic solution, Place 1 drop into the right eye 2 (two) times daily. , Disp: , Rfl:  .  calcium acetate (PHOSLO) 667 MG capsule, Take by mouth., Disp: , Rfl:  .  camphor-menthol (SARNA) lotion, Apply 1 application topically every 8 (eight) hours as needed for itching., Disp: 222 mL, Rfl: 0 .  Cetirizine HCl 10 MG CAPS, Take 1 capsule by mouth daily. , Disp: , Rfl:  .  Cholecalciferol (D3-1000 PO), Take by mouth., Disp: , Rfl:  .  Cinacalcet HCl (SENSIPAR PO), Take by mouth. , Disp: , Rfl:  .  clonazePAM (KLONOPIN) 0.5 MG tablet, Take 2 tablets every Monday, Wednesday, and Friday with hemodialysis, Disp: 30 tablet, Rfl: 5 .  COSOPT 22.3-6.8 MG/ML ophthalmic solution, Place 1 drop into the right eye 2 (two) times daily. , Disp: , Rfl:  .  FREESTYLE LITE test strip, , Disp: , Rfl:  .  hydrOXYzine (ATARAX/VISTARIL) 25 MG tablet, TAKE 1 TABLET (25 MG TOTAL) BY MOUTH EVERY 8 (EIGHT) HOURS AS NEEDED FOR ITCHING., Disp: 30 tablet, Rfl: 0 .  Insulin Glargine (BASAGLAR KWIKPEN) 100 UNIT/ML, Inject 10 Units into the skin daily. , Disp: , Rfl:  .  Insulin Lispro (HUMALOG KWIKPEN [place]), Inject 2-4 Units into the skin 3 (three) times daily before meals. Sliding scale: 150-250 =2 units, 250 -350=3 units, 350 += 4 units, Disp: , Rfl:  .  lacosamide  (VIMPAT) 200 MG TABS tablet, Take 1 tablet twice a day except on dialysis days M-W-F give 1 tab in AM, 1 tab after dialysis, 1 tab in PM., Disp: 90 tablet, Rfl: 5 .  lamoTRIgine (LAMICTAL) 200 MG tablet, Take 1 tablet (200 mg total) by mouth 2 (two) times daily., Disp: 60 tablet, Rfl: 11 .  Lancets (FREESTYLE) lancets, , Disp: , Rfl:  .  metoprolol tartrate (LOPRESSOR) 25 MG tablet, Take 25 mg by mouth daily., Disp: , Rfl:  .  metroNIDAZOLE (FLAGYL) 250 MG tablet, Take 1 tablet (250 mg total) by mouth every 8 (eight) hours., Disp: 9 tablet, Rfl: 0 .  midodrine (PROAMATINE) 10 MG tablet, TAKE 1 TABLET BY MOUTH TWICE DAILY WITH MEALS, Disp: 60 tablet, Rfl: 0 .  multivitamin (RENA-VIT) TABS tablet, Take 1 tablet by mouth at bedtime., Disp: , Rfl:  .  Nutritional Supplements (FEEDING SUPPLEMENT, NEPRO CARB STEADY,) LIQD, Take 237 mLs by mouth 3 (three) times daily as needed (Supplement)., Disp: 237 mL, Rfl: 12 .  pantoprazole (PROTONIX) 40 MG tablet, TAKE 1 TABLET BY MOUTH ONCE DAILY, Disp: 90 tablet, Rfl: 1 .  prochlorperazine (COMPAZINE) 10 MG tablet, TAKE 1 TABLET (10 MG TOTAL) BY MOUTH EVERY 6 (SIX) HOURS AS NEEDED FOR NAUSEA OR VOMITING., Disp: 30 tablet, Rfl: 0 .  UNIFINE PENTIPS 31G X 8 MM MISC, USE AS DIRECTED DAILY AFTER SUPPER, Disp: 100 each, Rfl: 3  Allergies:  Allergies  Allergen Reactions  . Codeine Swelling  . Codeine Rash and Other (See Comments)    Unknown reaction (patient says it was more serious than just a rash, but he can't remember what happened)       Physical Exam:       Blood pressure (!) 151/67, pulse 78, temperature (!) 97 F (36.1 C), temperature source Tympanic, resp. rate 18, weight 190 lb 1.6 oz (86.2 kg), SpO2 100 %.         ECOG: 1     General appearance: Alert, awake without any distress. Head: Atraumatic without abnormalities Oropharynx: Without any thrush or ulcers. Eyes: No scleral icterus. Lymph nodes: No lymphadenopathy noted in the  cervical, supraclavicular, or axillary nodes Heart:regular rate and rhythm, without any murmurs or gallops.   Lung: Clear to auscultation without any rhonchi, wheezes or dullness to percussion. Abdomin: Soft, nontender without any shifting dullness or ascites. Musculoskeletal: No clubbing or cyanosis. Neurological: No motor or sensory deficits. Skin: No rashes or lesions. Psychiatric: Mood and affect appeared normal.                   Lab Results: Lab Results  Component Value Date   WBC 6.5 06/19/2020   HGB 10.8 (L) 06/19/2020   HCT 29.0 (L) 06/19/2020   MCV 91.8 06/19/2020   PLT 260 06/19/2020  Chemistry      Component Value Date/Time   NA 142 06/19/2020 1233   NA 142 02/15/2020 0000   NA 138 07/03/2017 0813   K 3.9 06/19/2020 1233   K 4.2 07/03/2017 0813   CL 98 06/19/2020 1233   CO2 37 (H) 06/19/2020 1233   CO2 27 07/03/2017 0813   BUN 28 (H) 06/19/2020 1233   BUN 41 (A) 03/22/2020 0000   BUN 46.4 (H) 07/03/2017 0813   CREATININE 7.76 (HH) 06/19/2020 1233   CREATININE 10.9 (HH) 07/03/2017 0813   GLU 157 03/22/2020 0000      Component Value Date/Time   CALCIUM 9.6 06/19/2020 1233   CALCIUM 9.3 07/03/2017 0813   ALKPHOS 108 06/19/2020 1233   ALKPHOS 77 07/03/2017 0813   AST 16 06/19/2020 1233   AST 16 07/03/2017 0813   ALT 17 06/19/2020 1233   ALT 14 07/03/2017 0813   BILITOT 1.0 06/19/2020 1233   BILITOT 0.93 07/03/2017 0813      Results for NATHIN, SARAN (MRN [id number]) as of 07/17/2020 10:05  Ref. Range 05/22/2020 09:15 06/19/2020 12:35  M Protein SerPl Elph-Mcnc Latest Ref Range: Not Observed g/dL Not Observed Not Observed  IFE 1 Unknown Comment Comment  Globulin, Total Latest Ref Range: 2.2 - 3.9 g/dL 2.5 2.4  B-Globulin SerPl Elph-Mcnc Latest Ref Range: 0.7 - 1.3 g/dL 0.8 0.8  IgG (Immunoglobin G), Serum Latest Ref Range: 603 - 1,613 mg/dL 607 574 (L)  IgM (Immunoglobulin M), Srm Latest Ref Range: 20 - 172 mg/dL 22 22  IgA Latest  Ref Range: 90 - 386 mg/dL 35 (L) 35 (L)  Results for JALAN, FARISS (MRN [id number]) as of 07/17/2020 10:05  Ref. Range 06/19/2020 12:33  Kappa free light chain Latest Ref Range: 3.3 - 19.4 mg/L 138.7 (H)  Lamda free light chains Latest Ref Range: 5.7 - 26.3 mg/L 45.9 (H)  Kappa, lamda light chain ratio Latest Ref Range: 0.26 - 1.65  3.02 (H)    Impression and Plan:  54 year old man with:  1.  Multiple myeloma diagnosed in 2019.  He was found to have IgG kappa subtype.  He has tolerated daratumumab without any major complications with excellent response based on his protein studies.  Risks and benefits of continuing this treatment for the time being were reviewed.  He will receive treatment today and in December and will have a treatment break after that with close monitoring.  High-dose chemotherapy and stem cell transplant is on hold for the time being.  2. Anemia: His hemoglobin remained stable without any additional need for transfusion.  His anemia is related to renal sufficiency and plasma cell disorder.   3.  Antiemetics: He has been using Compazine without any major nausea or vomiting reported.  4.  Renal failure: He continues to be hemodialysis dependent without any issues.  5. Goals of care: Aggressive measures are warranted at this time although his disease might not be curable.   6. Follow-up: In 1 month for repeat follow-up.   30 minutes were dedicated to this visit.  The time was spent on reviewing his disease status, discussing treatment options and addressing complications related to his cancer and cancer therapy.  Zola Button, MD 11/9/202110:02 AM

## 2020-07-17 NOTE — Patient Instructions (Signed)
[facility] Discharge Instructions for Patients Receiving Chemotherapy  Today you received the following chemotherapy agents: Darzalex Faspro  To help prevent nausea and vomiting after your treatment, we encourage you to take your nausea medication as directed.    If you develop nausea and vomiting that is not controlled by your nausea medication, call the clinic.   BELOW ARE SYMPTOMS THAT SHOULD BE REPORTED IMMEDIATELY:  *FEVER GREATER THAN 100.5 F  *CHILLS WITH OR WITHOUT FEVER  NAUSEA AND VOMITING THAT IS NOT CONTROLLED WITH YOUR NAUSEA MEDICATION  *UNUSUAL SHORTNESS OF BREATH  *UNUSUAL BRUISING OR BLEEDING  TENDERNESS IN MOUTH AND THROAT WITH OR WITHOUT PRESENCE OF ULCERS  *URINARY PROBLEMS  *BOWEL PROBLEMS  UNUSUAL RASH Items with * indicate a potential emergency and should be followed up as soon as possible.  Feel free to call the clinic should you have any questions or concerns. The clinic phone number is [phone].  Please show the [place] [facility] at check-in to the Emergency Department and triage nurse.

## 2020-07-17 NOTE — Telephone Encounter (Signed)
CRITICAL VALUE STICKER  CRITICAL VALUE: Creatinine 8.43  RECEIVER (on-site recipient of call): Wendall Mola, RN  DATE & TIME NOTIFIED: 07/17/2020 @ 1111  MESSENGER (representative from lab): Dorian Furnace  MD NOTIFIED: Dr. Zola Button  TIME OF NOTIFICATION: 1116  RESPONSE: Acknowledged.

## 2020-07-17 NOTE — Therapy (Signed)
[facility] [address] [place], [place], [address] Phone: [phone]   Fax:  [phone]  Occupational Therapy Treatment  Patient Details  Name: Isaiah [name] MRN: [id number] Date of Birth: 04/05/1966 Referring Provider (OT): Posey Pronto, Ankit A.   Encounter Date: 07/17/2020   OT End of Session - 07/17/20 1408    Visit Number 9    Number of Visits 17    Date for OT Re-Evaluation 08/09/20    Authorization Type Medicare    Authorization Time Period OOP has been met. No financial responsibility.    Authorization - Visit Number 9    Authorization - Number of Visits 10    Progress Note Due on Visit 10    OT Start Time 6629    OT Stop Time 1445    OT Time Calculation (min) 40 min    Activity Tolerance Patient tolerated treatment well    Behavior During Therapy WFL for tasks assessed/performed           Past Medical History:  Diagnosis Date  . A-fib ([place])   . Anemia   . Asthma   . DM type 2 (diabetes mellitus, type 2) ([place]) 06/09/2019  . ESRD (end stage renal disease) on dialysis ([place])   . Essential hypertension 06/09/2019  . GIB (gastrointestinal bleeding)    Recurrent episodes- 09/2014, 09/2015 and 05/2016  . Gout   . History of recent blood transfusion 10/27/14   2 Units PRBC's  . Hyperkalemia 07/2011  . Multiple myeloma ([place])   . OSA on CPAP   . Pulmonary embolism ([place]) 07/2011; 09/27/2014   a. Bilat PE 07/2011 - unclear cause, tx with 6 months Coumadin.;   . Seizure disorder ([place]) 06/09/2019  . Sepsis ([place])   . Sickle cell-thalassemia disease ([place])    a. Sickle cell trait.  . Sleep apnea   . Stroke ([place]) 09/2015   R-MCA, L-MCA, PCA and bilateral cerebellar complicated by DVT/PE  . Subdural hematoma ([place]) 05/2019    Past Surgical History:  Procedure Laterality Date  . AV FISTULA PLACEMENT Right 12/05/2015   Procedure: INSERTION OF ARTERIOVENOUS (AV) GORE-TEX GRAFT ARM;  Surgeon: Elam Dutch, MD;   Location: [facility];  Service: Vascular;  Laterality: Right;  . BONE MARROW BIOPSY    . CATARACT EXTRACTION  08/2019  . CATARACT EXTRACTION  09/2019  . CHOLECYSTECTOMY    . CHOLECYSTECTOMY  1990's?  . COLONOSCOPY WITH PROPOFOL N/A 01/22/2017   Procedure: COLONOSCOPY WITH PROPOFOL;  Surgeon: Wilford Corner, MD;  Location: [facility];  Service: Endoscopy;  Laterality: N/A;  . EYE [name] Right   . INSERTION OF DIALYSIS CATHETER Right 10/16/2015   Procedure: INSERTION OF PALINDROME DIALYSIS CATHETER ;  Surgeon: Elam Dutch, MD;  Location: [facility];  Service: Vascular;  Laterality: Right;  . OTHER SURGICAL HISTORY     Retinal [name]  . PARS PLANA VITRECTOMY  02/17/2012   Procedure: PARS PLANA VITRECTOMY WITH 25 GAUGE;  Surgeon: Hayden Pedro, MD;  Location: [facility];  Service: Ophthalmology;  Laterality: Right;  . PERIPHERAL VASCULAR CATHETERIZATION N/A 03/20/2016   Procedure: A/V Shuntogram/Fistulagram;  Surgeon: Conrad Jupiter, MD;  Location: [facility];  Service: Cardiovascular;  Laterality: N/A;  . TEE WITHOUT CARDIOVERSION N/A 12/02/2016   Procedure: TRANSESOPHAGEAL ECHOCARDIOGRAM (TEE);  Surgeon: Larey Dresser, MD;  Location: [facility];  Service: Cardiovascular;  Laterality: N/A;    There were no vitals filed for this visit.   Subjective Assessment -  07/17/20 1409    Subjective  Pt denies any pain. Pt reports feeling tired post infusion today. Pt's spouse dropped him off at therapy today.    Patient is accompanied by: Family member   spouse   Pertinent History multiple myeloma (currently undergoing chemo treatment), DM, CVA, DVT, seizure disorder, SDH, ESRD on dialysis, and history of SDH x 2    Limitations Fall. No Driving. RUE fistula-dialysis; hx of seizures    Patient Stated Goals write better with his right hand, pick things up better with R hand    Currently in Pain? No/denies           Treatment:  Analog/Digital times - reaching and manipulating pieces with RUE  and scanning table top for correct time. Difficulty noted with following directions and motor commands. Pt required max assistance and eliminating extra choices to 3 for successful matching of times.   Perfection - manipulation of peg pieces with RUE and max verbal and visual cues for problem solving pieces and identifying/locating space. Much difficulty noted with spatial and visual perceptual skills this day.                         OT Short Term Goals - 07/12/20 1505      OT SHORT TERM GOAL #1   Title Patient and caregiver will be independent with HEP 07/12/20    Time 4    Period Weeks    Status On-going    Target Date 07/12/20      OT SHORT TERM GOAL #2   Title Patient will perform UB dressing with min A in order to increase independence with ADLs    Time 4    Period Weeks    Status On-going      OT SHORT TERM GOAL #3   Title Patient will demonstrate improved function use of RUE as evidenced by increasing Box and Blocks score to 12 blocks or more.    Baseline RUE 9    Time 4    Period Weeks    Status New      OT SHORT TERM GOAL #4   Title Pt will improve RUE grip strength by at least 3 lbs or more in order to manage clothing with more independence with toileting    Baseline 22.4    Time 4    Period Weeks    Status On-going   23.1     OT SHORT TERM GOAL #5   Title Pt will perform all aspects of toileting with min A consistently    Time 4    Period Weeks    Status New      OT SHORT TERM GOAL #6   Title Pt and spouse will report patient utilizing RUE for functional tasks 15% of the time in daily activities.    Time 4    Period Weeks    Status On-going             OT Long Term Goals - 06/14/20 1808      OT LONG TERM GOAL #1   Title Pt will be independent with updated HEP 08/09/2020    Time 8    Period Weeks    Status New    Target Date 08/09/20      OT LONG TERM GOAL #2   Title Pt will improve RUE shoulder flexion to 110 degrees in order  to obtain lightweight object from overhead cabinet.    Time 8  Period Weeks    Status New      OT LONG TERM GOAL #3   Title Patient will demonstrate improved function use of RUE as evidenced by increasing Box and Blocks score to 17 blocks or more.    Time 8    Period Weeks    Status New      OT LONG TERM GOAL #4   Title Patient will perform UB dressing with supervision with verbal cues in order to increase indepedence with ADLs.    Time 8    Period Weeks    Status New      OT LONG TERM GOAL #5   Title Pt will improve RUE grip strength by at least 6 lbs in order to manage clothing with more independence with toileting    Time 8    Period Weeks    Status New      OT LONG TERM GOAL #6   Title Pt will perform all aspects of toileting with distant supervision for safety in order to increase independence with ADLs.    Time 8    Period Weeks    Status New      OT LONG TERM GOAL #7   Title Pt and spouse will report patient utilizing RUE for functional tasks 25% of the time in daily activities.    Time 8    Period Weeks    Status New                 Plan - 07/17/20 1530    Clinical Impression Statement Pt had infusion prior to session today and was fatigued mentally and physically for session.    OT Occupational Profile and History Detailed Assessment- Review of Records and additional review of physical, cognitive, psychosocial history related to current functional performance    Occupational performance deficits (Please refer to evaluation for details): IADL's;ADL's;Leisure;Social Participation    Body Structure / Function / Physical Skills ADL;Decreased knowledge of use of DME;Gait;Strength;Tone;GMC;Dexterity;Balance;Body mechanics;Proprioception;UE functional use;ROM;IADL;Coordination;FMC;Vision;Flexibility;Mobility    Cognitive Skills Attention;Problem Solve;Safety Awareness;Sequencing;Energy/Drive;Thought;Learn;Memory;Understand;Perception    Rehab Potential Good     Clinical Decision Making Limited treatment options, no task modification necessary    Comorbidities Affecting Occupational Performance: May have comorbidities impacting occupational performance    Modification or Assistance to Complete Evaluation  No modification of tasks or assist necessary to complete eval    OT Frequency 2x / week    OT Duration 8 weeks    OT Treatment/Interventions Self-care/ADL training;Moist Heat;Fluidtherapy;DME and/or AE instruction;Balance training;Gait Training;Therapeutic activities;Cognitive remediation/compensation;Therapeutic exercise;Ultrasound;Neuromuscular education;Passive range of motion;Visual/perceptual remediation/compensation;Functional Mobility Training;Electrical Stimulation;Energy conservation;Manual Therapy;Patient/family education    Plan functional mobility, RUE reach    Recommended Other Services Recommended ST referral - spouse asked to wait.    Consulted and Agree with Plan of Care Patient;Family member/caregiver    Family Member Consulted mom           Patient will benefit from skilled therapeutic intervention in order to improve the following deficits and impairments:   Body Structure / Function / Physical Skills: ADL, Decreased knowledge of use of DME, Gait, Strength, Tone, GMC, Dexterity, Balance, Body mechanics, Proprioception, UE functional use, ROM, IADL, Coordination, FMC, Vision, Flexibility, Mobility Cognitive Skills: Attention, Problem Solve, Safety Awareness, Sequencing, Energy/Drive, Thought, Learn, Memory, Understand, Perception     Visit Diagnosis: Other lack of coordination  Frontal lobe and executive function deficit  Attention and concentration deficit  Unsteadiness on feet  Visuospatial deficit  Muscle weakness (generalized)  Stiffness of right wrist,  not elsewhere classified    Problem List Patient Active Problem List   Diagnosis Date Noted  . Colitis 03/15/2020  . AMS (altered mental status) 10/05/2019  .  Acute metabolic encephalopathy 70/35/0093  . Headache, unspecified 09/28/2019  . Abnormality of gait 09/13/2019  . Diabetes mellitus with end-stage renal disease ([place]) 08/07/2019  . Benign hypertensive kidney disease with chronic kidney disease 08/07/2019  . Hypothyroidism, unspecified 07/15/2019  . Spastic hemiparesis ([place]) 07/12/2019  . Dysphagia, oropharyngeal phase 07/05/2019  . Allergy, unspecified, initial encounter 06/27/2019  . PAF (paroxysmal atrial fibrillation) ([place])   . Bacteremia   . Labile blood pressure   . Labile blood glucose   . Controlled type 2 diabetes mellitus with hyperglycemia, without long-term current use of insulin ([place])   . ESRD on dialysis ([place])   . Right sided weakness   . SDH (subdural hematoma) ([place])   . Chronic intracranial subdural hematoma ([facility]) 06/09/2019  . Multiple myeloma not having achieved remission ([place]) 06/09/2019  . ESRD on hemodialysis ([place]) 06/09/2019  . Essential hypertension 06/09/2019  . DM type 2 (diabetes mellitus, type 2) ([place]) 06/09/2019  . Seizure disorder ([place]) 06/09/2019  . Aspiration pneumonia ([place]) 06/09/2019  . History of bacteremia 06/09/2019  . Atrial fibrillation, chronic ([place]) 06/09/2019  . Bilateral kidney stones 05/31/2019  . Proteus infection 05/28/2019  . History of restrictive pulmonary disease 04/07/2019  . Multiple myeloma ([place]) 05/17/2018  . Goals of care, counseling/discussion 05/17/2018  . Partial idiopathic epilepsy with seizures of localized onset, intractable, without status epilepticus ([place]) 11/02/2017  . Iron deficiency anemia, unspecified 01/22/2017  . COPD (chronic obstructive pulmonary disease) ([place]) 11/11/2016  . CVA (cerebral vascular accident) ([place]) 11/11/2016  . GERD (gastroesophageal reflux disease) 11/11/2016  . GI bleed 11/11/2016  . 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  . Obesity (BMI 30-39.9) 11/11/2016  . Pulmonary hypertension ([place]) 11/11/2016  . Patent foramen ovale  11/11/2016  . Hyperparathyroidism, secondary renal ([place]) 11/11/2016  . VTE (venous thromboembolism) 11/11/2016  . Hypotension 11/11/2016  . Systemic hypertension 11/11/2016  . Seizure ([place]) 11/11/2016  . Other seizures ([place]) 07/25/2016  . Encounter for removal of sutures 05/27/2016  . Ingrown nail 03/03/2016  . Onychomycosis due to dermatophyte 03/03/2016  . Other acquired hammer toe 03/03/2016  . Hemiparesis affecting right side as late effect of cerebrovascular accident ([place]) 02/28/2016  . Aphasia complicating stroke 81/82/9937  . Seizures ([place]) 01/08/2016  . History of ischemic left MCA stroke 01/08/2016  . Right sided weakness 01/08/2016  . Atrial fibrillation ([place]) 01/08/2016  . Leukocytosis 01/08/2016  . Benign essential HTN 01/08/2016  . Anemia of chronic disease 01/08/2016  . Controlled type 2 diabetes mellitus with diabetic nephropathy, without long-term current use of insulin ([place]) 01/08/2016  . History of DVT (deep vein thrombosis) 01/08/2016  . 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 Hypocalcemia 12/26/2015  . Long term (current) use of anticoagulants 11/07/2015  . Aftercare including intermittent dialysis ([place]) 11/02/2015  . Other specified coagulation defects ([place]) 11/02/2015  . Complication of vascular dialysis catheter 11/02/2015  . Diarrhea, unspecified 11/02/2015  . Fever, unspecified 11/02/2015  . Pruritus, unspecified 11/02/2015  . Shortness of breath 11/02/2015  . ESRD (end stage renal disease) on dialysis [facility])     Zachery Conch MOT, OTR/L  07/17/2020, 3:32 PM  [facility] [address] [place], [place], [address] Phone: [phone]   Fax:  [phone]  Name: Isaiah [name] MRN: [id number] Date of Birth: 04-22-1966

## 2020-07-18 DIAGNOSIS — Z23 Encounter for immunization: Secondary | ICD-10-CM | POA: Diagnosis not present

## 2020-07-18 DIAGNOSIS — N186 End stage renal disease: Secondary | ICD-10-CM | POA: Diagnosis not present

## 2020-07-18 DIAGNOSIS — N2581 Secondary hyperparathyroidism of renal origin: Secondary | ICD-10-CM | POA: Diagnosis not present

## 2020-07-18 DIAGNOSIS — E1129 Type 2 diabetes mellitus with other diabetic kidney complication: Secondary | ICD-10-CM | POA: Diagnosis not present

## 2020-07-18 DIAGNOSIS — D631 Anemia in chronic kidney disease: Secondary | ICD-10-CM | POA: Diagnosis not present

## 2020-07-18 DIAGNOSIS — Z992 Dependence on renal dialysis: Secondary | ICD-10-CM | POA: Diagnosis not present

## 2020-07-18 LAB — MULTIPLE MYELOMA PANEL, SERUM
Albumin SerPl Elph-Mcnc: 4.2 g/dL (ref 2.9–4.4)
Albumin/Glob SerPl: 1.6 (ref 0.7–1.7)
Alpha 1: 0.3 g/dL (ref 0.0–0.4)
Alpha2 Glob SerPl Elph-Mcnc: 0.9 g/dL (ref 0.4–1.0)
B-Globulin SerPl Elph-Mcnc: 0.9 g/dL (ref 0.7–1.3)
Gamma Glob SerPl Elph-Mcnc: 0.7 g/dL (ref 0.4–1.8)
Globulin, Total: 2.7 g/dL (ref 2.2–3.9)
IgA: 37 mg/dL — ABNORMAL LOW (ref 90–386)
IgG (Immunoglobin G), Serum: 626 mg/dL (ref 603–1613)
IgM (Immunoglobulin M), Srm: 23 mg/dL (ref 20–172)
Total Protein ELP: 6.9 g/dL (ref 6.0–8.5)

## 2020-07-18 LAB — KAPPA/LAMBDA LIGHT CHAINS
Kappa free light chain: 133.2 mg/L — ABNORMAL HIGH (ref 3.3–19.4)
Kappa, lambda light chain ratio: 2.67 — ABNORMAL HIGH (ref 0.26–1.65)
Lambda free light chains: 49.9 mg/L — ABNORMAL HIGH (ref 5.7–26.3)

## 2020-07-18 NOTE — Therapy (Signed)
[facility] [address] [place], [place], [address] Phone: [phone]   Fax:  [phone]  Physical Therapy Treatment  Patient Details  Name: Isaiah [name] [facility] MRN: [id number] Date of Birth: May 06, 1966 Referring Provider (PT): Delice Lesch, MD   Encounter Date: 07/17/2020   PT End of Session - 07/17/20 1451    Visit Number 21    Number of Visits 27   per recent recert wk of 1/09   Date for PT Re-Evaluation 08/30/20    Authorization Type Medicare; PN/Recert 11/28/53    Progress Note Due on Visit 30    PT Start Time 1448    PT Stop Time 1530    PT Time Calculation (min) 42 min    Equipment Utilized During Treatment Gait belt    Activity Tolerance Patient tolerated treatment well    Behavior During Therapy [facility] for tasks assessed/performed           Past Medical History:  Diagnosis Date  . A-fib ([place])   . Anemia   . Asthma   . DM type 2 (diabetes mellitus, type 2) ([place]) 06/09/2019  . ESRD (end stage renal disease) on dialysis ([place])   . Essential hypertension 06/09/2019  . GIB (gastrointestinal bleeding)    Recurrent episodes- 09/2014, 09/2015 and 05/2016  . Gout   . History of recent blood transfusion 10/27/14   2 Units PRBC's  . Hyperkalemia 07/2011  . Multiple myeloma ([place])   . OSA on CPAP   . Pulmonary embolism ([place]) 07/2011; 09/27/2014   a. Bilat PE 07/2011 - unclear cause, tx with 6 months Coumadin.;   . Seizure disorder ([place]) 06/09/2019  . Sepsis ([place])   . Sickle cell-thalassemia disease ([place])    a. Sickle cell trait.  . Sleep apnea   . Stroke ([place]) 09/2015   R-MCA, L-MCA, PCA and bilateral cerebellar complicated by DVT/PE  . Subdural hematoma ([place]) 05/2019    Past Surgical History:  Procedure Laterality Date  . AV FISTULA PLACEMENT Right 12/05/2015   Procedure: INSERTION OF ARTERIOVENOUS (AV) GORE-TEX GRAFT ARM;  Surgeon: Elam Dutch, MD;  Location: [facility];  Service: Vascular;  Laterality: Right;    . BONE MARROW BIOPSY    . CATARACT EXTRACTION  08/2019  . CATARACT EXTRACTION  09/2019  . CHOLECYSTECTOMY    . CHOLECYSTECTOMY  1990's?  . COLONOSCOPY WITH PROPOFOL N/A 01/22/2017   Procedure: COLONOSCOPY WITH PROPOFOL;  Surgeon: Wilford Corner, MD;  Location: [facility];  Service: Endoscopy;  Laterality: N/A;  . EYE SURGERY Right   . INSERTION OF DIALYSIS CATHETER Right 10/16/2015   Procedure: INSERTION OF PALINDROME DIALYSIS CATHETER ;  Surgeon: Elam Dutch, MD;  Location: [facility];  Service: Vascular;  Laterality: Right;  . OTHER SURGICAL HISTORY     Retinal surgery  . PARS PLANA VITRECTOMY  02/17/2012   Procedure: PARS PLANA VITRECTOMY WITH 25 GAUGE;  Surgeon: Hayden Pedro, MD;  Location: [facility];  Service: Ophthalmology;  Laterality: Right;  . PERIPHERAL VASCULAR CATHETERIZATION N/A 03/20/2016   Procedure: A/V Shuntogram/Fistulagram;  Surgeon: Conrad L[place], MD;  Location: [facility];  Service: Cardiovascular;  Laterality: N/A;  . TEE WITHOUT CARDIOVERSION N/A 12/02/2016   Procedure: TRANSESOPHAGEAL ECHOCARDIOGRAM (TEE);  Surgeon: Larey Dresser, MD;  Location: [facility];  Service: Cardiovascular;  Laterality: N/A;    There were no vitals filed for this visit.   Subjective Assessment - 07/17/20 1450    Subjective No new complaints. [place]  just have an infusion prior to therapy today. Tired today.    Pertinent History multiple myeloma, ESRD on dialysis, DM type II, DVT with IVC filter, CVA, COPD, a-fib; chemo treatments down to 1x/month (next is August 17th)    Patient Stated Goals To get back to walk without the walker, back to his baseline    Currently in Pain? No/denies    Pain Score 0-No pain               07/17/20 1451  Transfers  Transfers Sit to Stand;Stand to Sit  Sit to Stand 5: Supervision;With upper extremity assist;From bed;From chair/3-in-1  Stand to Sit 4: Min guard;With upper extremity assist;To bed;To chair/3-in-1  Ambulation/Gait   Ambulation/Gait Yes  Ambulation/Gait Assistance 4: Min guard  Ambulation/Gait Assistance Details around gym with session. cues for posture, object avoidance on left and walker position needed.   Assistive device Rolling walker  Gait Pattern Step-through pattern;Trunk flexed;Poor foot clearance - left;Poor foot clearance - right  Ambulation Surface Level;Indoor  High Level Balance  High Level Balance Activities Figure 8 turns  High Level Balance Comments with RW around hoola hoops for 4 laps with min guard assist, cues on walker position, to stay closer to object with turning and assist/cues for weight shifting to allow for improved step length.   Knee/Hip Exercises: Aerobic  Nustep UE/LE's on level 4 for 8 minute with goal 50-55 steps per minute for strengthening and activity tolerance.   Knee/Hip Exercises: Standing  Heel Raises Both;1 set;10 reps;Limitations  Heel Raises Limitations with UE support and bil 3# ankle weights. cues for controlled movements  Hip Flexion AROM;Stengthening;Both;1 set;10 reps;Knee bent;Limitations  Hip Flexion Limitations alternating marching with bil 3# ankle weights. cues for increased hip/knee flexion.   Knee/Hip Exercises: Seated  Long Arc Quad AROM;Strengthening;Both;1 set;10 reps;Weights;Limitations  Long Arc Quad Weight 3 lbs.  Long [employer] Limitations cues for slow, controlled movements  Hamstring Curl AROM;Strengthening;Both;1 set;10 reps;Limitations  Hamstring Limitations green band resistance with cues for full knee flexion and slow, controlled movements.            PT Short Term Goals - 07/03/20 1239      PT SHORT TERM GOAL #1   Title Pt will perform HEP independenlty for improved strength, balance and gait.    Baseline performs, but needs supervision for safety in standing    Time 4    Period Weeks    Status Partially Met    Target Date 06/28/20      PT SHORT TERM GOAL #2   Title Pt will improve gait velocity to at least 1.3 ft/sec  for improved gait efficiency and safety.    Baseline 1.27f/s with RW 05/31/20; 1.46 ft/sec 07/03/2020    Time 4    Period Weeks    Status Achieved    Target Date 06/28/20             PT Long Term Goals - 06/05/20 2150      PT LONG TERM GOAL #1   Title Pt will be independent with progression of HEP for improved strength, blaance, gait.    Time 8    Period Weeks    Status On-going    Target Date 07/26/20      PT LONG TERM GOAL #2   Title Pt will imrpove Berg Balance score to at least 33/56 for decreased fall risk.    Baseline 30/56 05/31/20    Time 8    Period Weeks  Status On-going    Target Date 07/26/20      PT LONG TERM GOAL #3   Title Pt will improve TUG score to less than or equal to 30 seconds for decreased fall risk.    Baseline 37 sec 05/31/20    Time 8    Period Weeks    Status On-going    Target Date 07/26/20      PT LONG TERM GOAL #4   Title Pt will improve gait velocity to at least 1.8 ft/sec for improved gait velocity and decreased fall risk.    Baseline 1.12 ft/s 05/31/20    Time 8    Period Weeks    Status On-going    Target Date 07/26/20      PT LONG TERM GOAL #5   Title Pt will ascend/descent 12 steps with both handrails with supervision only, to negotiate steps at home.    Baseline not attempted 05/31/20    Time 8    Period Weeks    Status On-going    Target Date 07/26/20                 Plan - 07/17/20 1451    Clinical Impression Statement Today's skilled session was limited due to pt's fatigue post infusion today. Focused on LE strengthening and gait with RW around objects wtih emphasis on scanning enviroment. The pt is progressing toward goals and should benefit from continued PT to progress toward unmet goals.    Personal Factors and Comorbidities Comorbidity 3+    Comorbidities PMH:  multiple myeloma, ESRD on dialysis, DM type II, DVT with IVC filter, CVA, COPD, a-fib    Examination-Activity Limitations Locomotion  Level;Transfers;Stairs;Stand    Examination-Participation Restrictions Community Activity    Stability/Clinical Decision Making Evolving/Moderate complexity    Rehab Potential Good    PT Frequency 2x / week    PT Duration 8 weeks   per recert 04/18/90   PT Treatment/Interventions ADLs/Self Care Home Management;DME Instruction;Neuromuscular re-education;Balance training;Therapeutic exercise;Therapeutic activities;Functional mobility training;Stair training;Gait training;Patient/family education    PT Next Visit Plan Continue gait, obstacle negotiation, SLS, functional BLE strengthening; standing balance progressing towards LTGs    PT Home Exercise Plan Access Code [email or web address]    Consulted and Agree with Plan of Care Patient;Family member/caregiver    Family Member Consulted mother           Patient will benefit from skilled therapeutic intervention in order to improve the following deficits and impairments:  Abnormal gait, Difficulty walking, Decreased safety awareness, Decreased balance, Decreased mobility, Decreased strength  Visit Diagnosis: Unsteadiness on feet  Muscle weakness (generalized)  Other abnormalities of gait and mobility     Problem List Patient Active Problem List   Diagnosis Date Noted  . Colitis 03/15/2020  . AMS (altered mental status) 10/05/2019  . Acute metabolic encephalopathy 13/04/6577  . Headache, unspecified 09/28/2019  . Abnormality of gait 09/13/2019  . Diabetes mellitus with end-stage renal disease ([place]) 08/07/2019  . Benign hypertensive kidney disease with chronic kidney disease 08/07/2019  . Hypothyroidism, unspecified 07/15/2019  . Spastic hemiparesis ([place]) 07/12/2019  . Dysphagia, oropharyngeal phase 07/05/2019  . Allergy, unspecified, initial encounter 06/27/2019  . PAF (paroxysmal atrial fibrillation) ([place])   . Bacteremia   . Labile blood pressure   . Labile blood glucose   . Controlled type 2 diabetes mellitus with hyperglycemia, without  long-term current use of insulin ([place])   . ESRD on dialysis ([place])   . Right sided weakness   .  SDH (subdural hematoma) ([place])   . Chronic intracranial subdural hematoma (HCC) 06/09/2019  . Multiple myeloma not having achieved remission ([place]) 06/09/2019  . ESRD on hemodialysis ([place]) 06/09/2019  . Essential hypertension 06/09/2019  . DM type 2 (diabetes mellitus, type 2) ([place]) 06/09/2019  . Seizure disorder ([place]) 06/09/2019  . Aspiration pneumonia ([place]) 06/09/2019  . History of bacteremia 06/09/2019  . Atrial fibrillation, chronic ([place]) 06/09/2019  . Bilateral kidney stones 05/31/2019  . Proteus infection 05/28/2019  . History of restrictive pulmonary disease 04/07/2019  . Multiple myeloma ([place]) 05/17/2018  . Goals of care, counseling/discussion 05/17/2018  . Partial idiopathic epilepsy with seizures of localized onset, intractable, without status epilepticus ([place]) 11/02/2017  . Iron deficiency anemia, unspecified 01/22/2017  . COPD (chronic obstructive pulmonary disease) ([place]) 11/11/2016  . CVA (cerebral vascular accident) ([place]) 11/11/2016  . GERD (gastroesophageal reflux disease) 11/11/2016  . GI bleed 11/11/2016  . 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  . Obesity (BMI 30-39.9) 11/11/2016  . Pulmonary hypertension ([place]) 11/11/2016  . Patent foramen ovale 11/11/2016  . Hyperparathyroidism, secondary renal ([place]) 11/11/2016  . VTE (venous thromboembolism) 11/11/2016  . Hypotension 11/11/2016  . Systemic hypertension 11/11/2016  . Seizure ([place]) 11/11/2016  . Other seizures ([place]) 07/25/2016  . Encounter for removal of sutures 05/27/2016  . Ingrown nail 03/03/2016  . Onychomycosis due to dermatophyte 03/03/2016  . Other acquired hammer toe 03/03/2016  . Hemiparesis affecting right side as late effect of cerebrovascular accident ([place]) 02/28/2016  . Aphasia complicating stroke 97/84/7841  . Seizures ([place]) 01/08/2016  . History of ischemic left MCA stroke 01/08/2016  . Right  sided weakness 01/08/2016  . Atrial fibrillation ([place]) 01/08/2016  . Leukocytosis 01/08/2016  . Benign essential HTN 01/08/2016  . Anemia of chronic disease 01/08/2016  . Controlled type 2 diabetes mellitus with diabetic nephropathy, without long-term current use of insulin ([place]) 01/08/2016  . History of DVT (deep vein thrombosis) 01/08/2016  . 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 Hypocalcemia 12/26/2015  . Long term (current) use of anticoagulants 11/07/2015  . Aftercare including intermittent dialysis ([place]) 11/02/2015  . Other specified coagulation defects ([place]) 11/02/2015  . Complication of vascular dialysis catheter 11/02/2015  . Diarrhea, unspecified 11/02/2015  . Fever, unspecified 11/02/2015  . Pruritus, unspecified 11/02/2015  . Shortness of breath 11/02/2015  . ESRD (end stage renal disease) on dialysis ([place])     Willow Ora, PTA, [facility] [address], [place], [place] [address] [phone] 07/19/20, 9:41 AM   Name: Isaiah [name] [facility] MRN: [id number] Date of Birth: 03-18-1966

## 2020-07-19 ENCOUNTER — Encounter: Payer: Self-pay | Admitting: Internal Medicine

## 2020-07-19 ENCOUNTER — Other Ambulatory Visit: Payer: Self-pay

## 2020-07-19 ENCOUNTER — Ambulatory Visit (INDEPENDENT_AMBULATORY_CARE_PROVIDER_SITE_OTHER): Payer: Medicare Other | Admitting: Internal Medicine

## 2020-07-19 VITALS — BP 132/80 | HR 77 | Temp 97.6°F | Ht 70.0 in | Wt 185.3 lb

## 2020-07-19 DIAGNOSIS — Z992 Dependence on renal dialysis: Secondary | ICD-10-CM

## 2020-07-19 DIAGNOSIS — Z1159 Encounter for screening for other viral diseases: Secondary | ICD-10-CM | POA: Diagnosis not present

## 2020-07-19 DIAGNOSIS — E1122 Type 2 diabetes mellitus with diabetic chronic kidney disease: Secondary | ICD-10-CM

## 2020-07-19 DIAGNOSIS — Z9989 Dependence on other enabling machines and devices: Secondary | ICD-10-CM | POA: Diagnosis not present

## 2020-07-19 DIAGNOSIS — C9000 Multiple myeloma not having achieved remission: Secondary | ICD-10-CM | POA: Diagnosis not present

## 2020-07-19 DIAGNOSIS — I120 Hypertensive chronic kidney disease with stage 5 chronic kidney disease or end stage renal disease: Secondary | ICD-10-CM | POA: Diagnosis not present

## 2020-07-19 DIAGNOSIS — N186 End stage renal disease: Secondary | ICD-10-CM | POA: Diagnosis not present

## 2020-07-19 LAB — CBC AND DIFFERENTIAL
HCT: 33 — AB (ref 41–53)
Hemoglobin: 11.4 — AB (ref 13.5–17.5)
Neutrophils Absolute: 76.1
Platelets: 248 (ref 150–399)
WBC: 9

## 2020-07-19 LAB — CBC: RBC: 3.18 — AB (ref 3.87–5.11)

## 2020-07-19 LAB — IRON,TIBC AND FERRITIN PANEL
%SAT: 47
Iron: 136
TIBC: 288
UIBC: 152

## 2020-07-19 LAB — BASIC METABOLIC PANEL
BUN: 64 — AB (ref 4–21)
Chloride: 98 — AB (ref 99–108)
Creatinine: 10.7 — AB (ref 0.6–1.3)
Glucose: 222
Potassium: 4.4 (ref 3.4–5.3)
Sodium: 141 (ref 137–147)

## 2020-07-19 LAB — COMPREHENSIVE METABOLIC PANEL: Calcium: 10 (ref 8.7–10.7)

## 2020-07-19 NOTE — Progress Notes (Signed)
I,Katawbba Wiggins,acting as a Education administrator for [name], MD.,have documented all relevant documentation on the behalf of [name], MD,as directed by  [name], MD while in the presence of [name], MD.  This visit occurred during the SARS-CoV-2 public health emergency.  Safety protocols were in place, including screening questions prior to the visit, additional usage of staff PPE, and extensive cleaning of exam room while observing appropriate contact time as indicated for disinfecting solutions.  Subjective:     Patient ID: Isaiah [name] , male    DOB: [birth year] , 54 y.o.   MRN: [id number]   Chief Complaint  Patient presents with  . Diabetes  . Hypertension    HPI  He presents today for diabetes f/u. He is also followed by Dr. Chalmers Cater. He is accompanied by his wife today.   Diabetes He presents for his follow-up diabetic visit. He has type 2 diabetes mellitus. His disease course has been stable. There are no hypoglycemic associated symptoms. Pertinent negatives for diabetes include no blurred vision. There are no hypoglycemic complications. Diabetic complications include nephropathy. Risk factors for coronary artery disease include diabetes mellitus, dyslipidemia, hypertension, male sex and obesity. He is following a diabetic diet.  Hypertension This is a chronic problem. The current episode started more than 1 year ago. The problem has been gradually improving since onset. The problem is controlled. Pertinent negatives include no anxiety or blurred vision. The current treatment provides moderate improvement.     Past Medical History:  Diagnosis Date  . A-fib ([place])   . Anemia   . Asthma   . DM type 2 (diabetes mellitus, type 2) ([place]) 06/09/2019  . ESRD (end stage renal disease) on dialysis ([place])   . Essential hypertension 06/09/2019  . GIB (gastrointestinal bleeding)    Recurrent episodes- 09/2014, 09/2015 and 05/2016  . Gout   . History of recent blood  transfusion 10/27/14   2 Units PRBC's  . Hyperkalemia 07/2011  . Multiple myeloma ([place])   . OSA on CPAP   . Pulmonary embolism ([place]) 07/2011; 09/27/2014   a. Bilat PE 07/2011 - unclear cause, tx with 6 months Coumadin.;   . Seizure disorder ([place]) 06/09/2019  . Sepsis ([place])   . Sickle cell-thalassemia disease ([place])    a. Sickle cell trait.  . Sleep apnea   . Stroke ([place]) 09/2015   R-MCA, L-MCA, PCA and bilateral cerebellar complicated by DVT/PE  . Subdural hematoma ([place]) 05/2019     Family History  Problem Relation Age of Onset  . Hypertension Mother   . Hypertension Father   . Stroke Father   . Sickle cell anemia Brother   . Diabetes Mother   . Sickle cell anemia Brother   . Diabetes type I Brother   . Kidney disease Brother      Current Outpatient Medications:  .  acetaminophen (TYLENOL) 325 MG tablet, Take 1-2 tablets (325-650 mg total) by mouth every 4 (four) hours as needed for mild pain., Disp:  , Rfl:  .  albuterol (PROAIR HFA) 108 (90 Base) MCG/ACT inhaler, Inhale two puffs every 4-6 hours if needed for cough or wheeze (Patient taking differently: Inhale 2 puffs into the lungs every 4 (four) hours as needed for wheezing or shortness of breath. Inhale two puffs every 4-6 hours if needed for cough or wheeze), Disp: 6.7 g, Rfl: 2 .  Alcohol Swabs (SM ALCOHOL PREP) 70 % PADS, , Disp: , Rfl:  .  atorvastatin (LIPITOR)  20 MG tablet, TAKE 1 TABLET BY MOUTH ONCE A DAY, Disp: 30 tablet, Rfl: 0 .  AURYXIA 1 GM 210 MG(Fe) tablet, Take 210 mg by mouth 3 (three) times daily., Disp: , Rfl:  .  BREO ELLIPTA 200-25 MCG/INH AEPB, INHALE 1 PUFF INTO THE LUNGS AT THE SAME TIME EVERY DAY, Disp: 60 each, Rfl: 2 .  calcium acetate (PHOSLO) 667 MG capsule, Take by mouth., Disp: , Rfl:  .  camphor-menthol (SARNA) lotion, Apply 1 application topically every 8 (eight) hours as needed for itching., Disp: 222 mL, Rfl: 0 .  Cetirizine HCl 10 MG CAPS, Take 1 capsule by mouth daily. , Disp: , Rfl:  .   Cholecalciferol (D3-1000 PO), Take by mouth., Disp: , Rfl:  .  clonazePAM (KLONOPIN) 0.5 MG tablet, Take 2 tablets every Monday, Wednesday, and Friday with hemodialysis, Disp: 30 tablet, Rfl: 5 .  FREESTYLE LITE test strip, , Disp: , Rfl:  .  hydrOXYzine (ATARAX/VISTARIL) 25 MG tablet, TAKE 1 TABLET (25 MG TOTAL) BY MOUTH EVERY 8 (EIGHT) HOURS AS NEEDED FOR ITCHING., Disp: 30 tablet, Rfl: 0 .  Insulin Lispro (HUMALOG KWIKPEN [place]), Inject 2-4 Units into the skin 3 (three) times daily before meals. Sliding scale: 150-250 =2 units, 250 -350=3 units, 350 += 4 units, Disp: , Rfl:  .  lacosamide (VIMPAT) 200 MG TABS tablet, Take 1 tablet twice a day except on dialysis days M-W-F give 1 tab in AM, 1 tab after dialysis, 1 tab in PM., Disp: 90 tablet, Rfl: 5 .  lamoTRIgine (LAMICTAL) 200 MG tablet, Take 1 tablet (200 mg total) by mouth 2 (two) times daily., Disp: 60 tablet, Rfl: 11 .  Lancets (FREESTYLE) lancets, , Disp: , Rfl:  .  metoprolol tartrate (LOPRESSOR) 25 MG tablet, Take 25 mg by mouth daily., Disp: , Rfl:  .  multivitamin (RENA-VIT) TABS tablet, Take 1 tablet by mouth at bedtime., Disp: , Rfl:  .  Nutritional Supplements (FEEDING SUPPLEMENT, NEPRO CARB STEADY,) LIQD, Take 237 mLs by mouth 3 (three) times daily as needed (Supplement)., Disp: 237 mL, Rfl: 12 .  pantoprazole (PROTONIX) 40 MG tablet, TAKE 1 TABLET BY MOUTH ONCE DAILY, Disp: 90 tablet, Rfl: 1 .  prochlorperazine (COMPAZINE) 10 MG tablet, TAKE 1 TABLET (10 MG TOTAL) BY MOUTH EVERY 6 (SIX) HOURS AS NEEDED FOR NAUSEA OR VOMITING., Disp: 30 tablet, Rfl: 0 .  UNIFINE PENTIPS 31G X 8 MM MISC, USE AS DIRECTED DAILY AFTER SUPPER, Disp: 100 each, Rfl: 3 .  acyclovir (ZOVIRAX) 400 MG tablet, TAKE 1 TABLET BY MOUTH ONCE DAILY, Disp: 90 tablet, Rfl: 0 .  Cinacalcet HCl (SENSIPAR PO), Take by mouth.  (Patient not taking: Reported on 07/19/2020), Disp: , Rfl:  .  Insulin Glargine (BASAGLAR KWIKPEN) 100 UNIT/ML, Inject 8 Units into the skin daily.,  Disp: 3 mL, Rfl: 3 .  midodrine (PROAMATINE) 10 MG tablet, TAKE 1 TABLET BY MOUTH TWICE DAILY WITH MEALS, Disp: 60 tablet, Rfl: 0   Allergies  Allergen Reactions  . Codeine Swelling  . Codeine Rash and Other (See Comments)    Unknown reaction (patient says it was more serious than just a rash, but he can't remember what happened)      Review of Systems  Constitutional: Negative.   Eyes: Negative for blurred vision.  Respiratory: Negative.   Cardiovascular: Negative.   Gastrointestinal: Negative.   Neurological: Negative.   Psychiatric/Behavioral: Negative.      Today's Vitals   07/19/20 1608  BP: 132/80  Pulse:  77  Temp: 97.6 F (36.4 C)  TempSrc: Oral  Weight: 185 lb 4.2 oz (84 kg)  Height: _0  (1.778 m)  PainSc: 0-No pain   Body mass index is 26.58 kg/m.   Objective:  Physical Exam Vitals and nursing note reviewed.  Constitutional:      Appearance: Normal appearance.  Cardiovascular:     Rate and Rhythm: Normal rate and regular rhythm.     Heart sounds: Normal heart sounds.  Pulmonary:     Effort: Pulmonary effort is normal.     Breath sounds: Normal breath sounds.  Musculoskeletal:     Cervical back: Normal range of motion.     Comments: Ambulatory with walker  Skin:    General: Skin is warm.  Neurological:     Mental Status: He is alert.     Gait: Gait abnormal.     Comments: Ambulatory w/ walker  Psychiatric:        Mood and Affect: Mood normal.         Assessment And Plan:     1. Diabetes mellitus with end-stage renal disease (HCC) Comments: Chronic, I will request Dr. Almetta Lovely most recent notes. He agrees to have hba1c drawn at dialysis center. He is encouraged to have eye exam by end of 12/21.   2. Benign hypertensive kidney disease with chronic kidney disease stage V or end stage renal disease (HCC) Comments: Chronic, fair control. He will continue with current meds for now. Encouraged to follow a low-sodium diet.   3. Multiple myeloma  not having achieved remission ([place]) Comments: Chronic, also followed by Oncology. His current treatment is daratumumab which he appears to be tolerating well.   4. ESRD on dialysis [name]) Comments: As per Nephrology. He has dialysis MWF at this time.   5. Need for hepatitis C screening test Comments: I will check HCV ab w/ next dialysis lab draw.      Patient was given opportunity to ask questions. Patient verbalized understanding of the plan and was able to repeat key elements of the plan. All questions were answered to their satisfaction.  [name], MD   I, [name], MD, have reviewed all documentation for this visit. The documentation on 08/18/20 for the exam, diagnosis, procedures, and orders are all accurate and complete.  THE PATIENT IS ENCOURAGED TO PRACTICE SOCIAL DISTANCING DUE TO THE COVID-19 PANDEMIC.

## 2020-07-19 NOTE — Patient Instructions (Signed)
Diabetes Mellitus and Foot Care Foot care is an important part of your health, especially when you have diabetes. Diabetes may cause you to have problems because of poor blood flow (circulation) to your feet and legs, which can cause your skin to:  Become thinner and drier.  Break more easily.  Heal more slowly.  Peel and crack. You may also have nerve damage (neuropathy) in your legs and feet, causing decreased feeling in them. This means that you may not notice minor injuries to your feet that could lead to more serious problems. Noticing and addressing any potential problems early is the best way to prevent future foot problems. How to care for your feet Foot hygiene  Wash your feet daily with warm water and mild soap. Do not use hot water. Then, pat your feet and the areas between your toes until they are completely dry. Do not soak your feet as this can dry your skin.  Trim your toenails straight across. Do not dig under them or around the cuticle. File the edges of your nails with an emery board or nail file.  Apply a moisturizing lotion or petroleum jelly to the skin on your feet and to dry, brittle toenails. Use lotion that does not contain alcohol and is unscented. Do not apply lotion between your toes. Shoes and socks  Wear clean socks or stockings every day. Make sure they are not too tight. Do not wear knee-high stockings since they may decrease blood flow to your legs.  Wear shoes that fit properly and have enough cushioning. Always look in your shoes before you put them on to be sure there are no objects inside.  To break in new shoes, wear them for just a few hours a day. This prevents injuries on your feet. Wounds, scrapes, corns, and calluses  Check your feet daily for blisters, cuts, bruises, sores, and redness. If you cannot see the bottom of your feet, use a mirror or ask someone for help.  Do not cut corns or calluses or try to remove them with medicine.  If you  find a minor scrape, cut, or break in the skin on your feet, keep it and the skin around it clean and dry. You may clean these areas with mild soap and water. Do not clean the area with peroxide, alcohol, or iodine.  If you have a wound, scrape, corn, or callus on your foot, look at it several times a day to make sure it is healing and not infected. Check for: ? Redness, swelling, or pain. ? Fluid or blood. ? Warmth. ? Pus or a bad smell. General instructions  Do not cross your legs. This may decrease blood flow to your feet.  Do not use heating pads or hot water bottles on your feet. They may burn your skin. If you have lost feeling in your feet or legs, you may not know this is happening until it is too late.  Protect your feet from hot and cold by wearing shoes, such as at the beach or on hot pavement.  Schedule a complete foot exam at least once a year (annually) or more often if you have foot problems. If you have foot problems, report any cuts, sores, or bruises to your health care provider immediately. Contact a health care provider if:  You have a medical condition that increases your risk of infection and you have any cuts, sores, or bruises on your feet.  You have an injury that is not   healing.  You have redness on your legs or feet.  You feel burning or tingling in your legs or feet.  You have pain or cramps in your legs and feet.  Your legs or feet are numb.  Your feet always feel cold.  You have pain around a toenail. Get help right away if:  You have a wound, scrape, corn, or callus on your foot and: ? You have pain, swelling, or redness that gets worse. ? You have fluid or blood coming from the wound, scrape, corn, or callus. ? Your wound, scrape, corn, or callus feels warm to the touch. ? You have pus or a bad smell coming from the wound, scrape, corn, or callus. ? You have a fever. ? You have a red line going up your leg. Summary  Check your feet every day  for cuts, sores, red spots, swelling, and blisters.  Moisturize feet and legs daily.  Wear shoes that fit properly and have enough cushioning.  If you have foot problems, report any cuts, sores, or bruises to your health care provider immediately.  Schedule a complete foot exam at least once a year (annually) or more often if you have foot problems. This information is not intended to replace advice given to you by your health care provider. Make sure you discuss any questions you have with your health care provider. Document Revised: 05/18/2019 Document Reviewed: 09/26/2016 Elsevier Patient Education  2020 Elsevier Inc.  

## 2020-07-20 ENCOUNTER — Telehealth: Payer: Medicare Other

## 2020-07-20 DIAGNOSIS — E1129 Type 2 diabetes mellitus with other diabetic kidney complication: Secondary | ICD-10-CM | POA: Diagnosis not present

## 2020-07-20 DIAGNOSIS — N2581 Secondary hyperparathyroidism of renal origin: Secondary | ICD-10-CM | POA: Diagnosis not present

## 2020-07-20 DIAGNOSIS — Z992 Dependence on renal dialysis: Secondary | ICD-10-CM | POA: Diagnosis not present

## 2020-07-20 DIAGNOSIS — Z23 Encounter for immunization: Secondary | ICD-10-CM | POA: Diagnosis not present

## 2020-07-20 DIAGNOSIS — D631 Anemia in chronic kidney disease: Secondary | ICD-10-CM | POA: Diagnosis not present

## 2020-07-20 DIAGNOSIS — N186 End stage renal disease: Secondary | ICD-10-CM | POA: Diagnosis not present

## 2020-07-23 DIAGNOSIS — Z992 Dependence on renal dialysis: Secondary | ICD-10-CM | POA: Diagnosis not present

## 2020-07-23 DIAGNOSIS — N2581 Secondary hyperparathyroidism of renal origin: Secondary | ICD-10-CM | POA: Diagnosis not present

## 2020-07-23 DIAGNOSIS — D631 Anemia in chronic kidney disease: Secondary | ICD-10-CM | POA: Diagnosis not present

## 2020-07-23 DIAGNOSIS — E1129 Type 2 diabetes mellitus with other diabetic kidney complication: Secondary | ICD-10-CM | POA: Diagnosis not present

## 2020-07-23 DIAGNOSIS — N186 End stage renal disease: Secondary | ICD-10-CM | POA: Diagnosis not present

## 2020-07-23 DIAGNOSIS — Z23 Encounter for immunization: Secondary | ICD-10-CM | POA: Diagnosis not present

## 2020-07-24 ENCOUNTER — Encounter: Payer: Medicare Other | Admitting: Occupational Therapy

## 2020-07-24 ENCOUNTER — Ambulatory Visit: Payer: Medicare Other | Admitting: Physical Therapy

## 2020-07-25 DIAGNOSIS — E1129 Type 2 diabetes mellitus with other diabetic kidney complication: Secondary | ICD-10-CM | POA: Diagnosis not present

## 2020-07-25 DIAGNOSIS — N186 End stage renal disease: Secondary | ICD-10-CM | POA: Diagnosis not present

## 2020-07-25 DIAGNOSIS — Z992 Dependence on renal dialysis: Secondary | ICD-10-CM | POA: Diagnosis not present

## 2020-07-25 DIAGNOSIS — N2581 Secondary hyperparathyroidism of renal origin: Secondary | ICD-10-CM | POA: Diagnosis not present

## 2020-07-25 DIAGNOSIS — D631 Anemia in chronic kidney disease: Secondary | ICD-10-CM | POA: Diagnosis not present

## 2020-07-25 DIAGNOSIS — Z23 Encounter for immunization: Secondary | ICD-10-CM | POA: Diagnosis not present

## 2020-07-26 ENCOUNTER — Ambulatory Visit: Payer: Medicare Other | Admitting: Occupational Therapy

## 2020-07-26 ENCOUNTER — Other Ambulatory Visit: Payer: Self-pay

## 2020-07-26 ENCOUNTER — Encounter: Payer: Self-pay | Admitting: Occupational Therapy

## 2020-07-26 ENCOUNTER — Ambulatory Visit: Payer: Medicare Other | Admitting: Physical Therapy

## 2020-07-26 DIAGNOSIS — M6281 Muscle weakness (generalized): Secondary | ICD-10-CM | POA: Diagnosis not present

## 2020-07-26 DIAGNOSIS — R41844 Frontal lobe and executive function deficit: Secondary | ICD-10-CM

## 2020-07-26 DIAGNOSIS — R41840 Attention and concentration deficit: Secondary | ICD-10-CM | POA: Diagnosis not present

## 2020-07-26 DIAGNOSIS — R2681 Unsteadiness on feet: Secondary | ICD-10-CM

## 2020-07-26 DIAGNOSIS — M25631 Stiffness of right wrist, not elsewhere classified: Secondary | ICD-10-CM

## 2020-07-26 DIAGNOSIS — R278 Other lack of coordination: Secondary | ICD-10-CM | POA: Diagnosis not present

## 2020-07-26 DIAGNOSIS — R41842 Visuospatial deficit: Secondary | ICD-10-CM | POA: Diagnosis not present

## 2020-07-26 DIAGNOSIS — R2689 Other abnormalities of gait and mobility: Secondary | ICD-10-CM

## 2020-07-26 NOTE — Therapy (Signed)
[facility] [address] [place], [place], [address] Phone: [phone]   Fax:  [phone]  Occupational Therapy Treatment & 10th visit Progress Note  Patient Details  Name: Isaiah [name] [facility] MRN: [id number] Date of Birth: Apr 05, 1966 Referring Provider (OT): Posey Pronto, Ankit A.   Encounter Date: 07/26/2020   OT End of Session - 07/26/20 1237    Visit Number 10    Number of Visits 17    Date for OT Re-Evaluation 08/09/20    Authorization Type Medicare    Authorization Time Period Week 6 of 8 (11/18) OOP has been met. No financial responsibility.    Authorization - Visit Number 10    Progress Note Due on Visit 20    OT Start Time 9379    OT Stop Time 1315    OT Time Calculation (min) 40 min    Activity Tolerance Patient tolerated treatment well    Behavior During Therapy WFL for tasks assessed/performed           Past Medical History:  Diagnosis Date  . A-fib ([place])   . Anemia   . Asthma   . DM type 2 (diabetes mellitus, type 2) ([place]) 06/09/2019  . ESRD (end stage renal disease) on dialysis ([place])   . Essential hypertension 06/09/2019  . GIB (gastrointestinal bleeding)    Recurrent episodes- 09/2014, 09/2015 and 05/2016  . Gout   . History of recent blood transfusion 10/27/14   2 Units PRBC's  . Hyperkalemia 07/2011  . Multiple myeloma ([place])   . OSA on CPAP   . Pulmonary embolism ([place]) 07/2011; 09/27/2014   a. Bilat PE 07/2011 - unclear cause, tx with 6 months Coumadin.;   . Seizure disorder ([place]) 06/09/2019  . Sepsis ([place])   . Sickle cell-thalassemia disease ([place])    a. Sickle cell trait.  . Sleep apnea   . Stroke ([place]) 09/2015   R-MCA, L-MCA, PCA and bilateral cerebellar complicated by DVT/PE  . Subdural hematoma ([place]) 05/2019    Past Surgical History:  Procedure Laterality Date  . AV FISTULA PLACEMENT Right 12/05/2015   Procedure: INSERTION OF ARTERIOVENOUS (AV) GORE-TEX GRAFT ARM;  Surgeon: Elam Dutch, MD;  Location: [facility];  Service: Vascular;  Laterality: Right;  . BONE MARROW BIOPSY    . CATARACT EXTRACTION  08/2019  . CATARACT EXTRACTION  09/2019  . CHOLECYSTECTOMY    . CHOLECYSTECTOMY  1990's?  . COLONOSCOPY WITH PROPOFOL N/A 01/22/2017   Procedure: COLONOSCOPY WITH PROPOFOL;  Surgeon: Wilford Corner, MD;  Location: [facility];  Service: Endoscopy;  Laterality: N/A;  . EYE SURGERY Right   . INSERTION OF DIALYSIS CATHETER Right 10/16/2015   Procedure: INSERTION OF PALINDROME DIALYSIS CATHETER ;  Surgeon: Elam Dutch, MD;  Location: [facility];  Service: Vascular;  Laterality: Right;  . OTHER SURGICAL HISTORY     Retinal surgery  . PARS PLANA VITRECTOMY  02/17/2012   Procedure: PARS PLANA VITRECTOMY WITH 25 GAUGE;  Surgeon: Hayden Pedro, MD;  Location: [facility];  Service: Ophthalmology;  Laterality: Right;  . PERIPHERAL VASCULAR CATHETERIZATION N/A 03/20/2016   Procedure: A/V Shuntogram/Fistulagram;  Surgeon: Conrad [place], MD;  Location: [facility];  Service: Cardiovascular;  Laterality: N/A;  . TEE WITHOUT CARDIOVERSION N/A 12/02/2016   Procedure: TRANSESOPHAGEAL ECHOCARDIOGRAM (TEE);  Surgeon: Larey Dresser, MD;  Location: [facility];  Service: Cardiovascular;  Laterality: N/A;    There were no vitals filed for this visit.   Subjective  Assessment - 07/26/20 1251    Subjective  Pt denies any pain. Pt reports he had a doctor's appointment with his PCP since last visit.    Patient is accompanied by: Family member   spouse   Pertinent History multiple myeloma (currently undergoing chemo treatment), DM, CVA, DVT, seizure disorder, SDH, ESRD on dialysis, and history of SDH x 2    Limitations Fall. No Driving. RUE fistula-dialysis; hx of seizures    Patient Stated Goals write better with his right hand, pick things up better with R hand    Currently in Pain? No/denies            Treatment:  Assessed STGs and LTGs. Today completed 10th visit Progress Note.    Supervision for UB dressing this day. Grip Strength Assessed and Box and Blocks (see STGs).  RUE reach and visual perception and cognitive development with rubber washers. Pt reached with RUE and managed materials with little difficulty and increased time. Pt with fatigue of RUE during activity after placing 2 washers on each pole required break.  Filling up entire 5x5 peg board with large pegs with RUE. Pt continues to have some difficulty with locating hole for peg but is demonstrating much improvement with this task. Pt utilized RUE for placing pegs and was able to place approximately 7 pegs in approx 6 minutes.                   OT Short Term Goals - 07/26/20 1238      OT SHORT TERM GOAL #1   Title Patient and caregiver will be independent with HEP 07/12/20    Time 4    Period Weeks    Status Achieved    Target Date 07/12/20      OT SHORT TERM GOAL #2   Title Patient will perform UB dressing with min A in order to increase independence with ADLs    Time 4    Period Weeks    Status Achieved      OT SHORT TERM GOAL #3   Title Patient will demonstrate improved function use of RUE as evidenced by increasing Box and Blocks score to 12 blocks or more.    Baseline RUE 9    Time 4    Period Weeks    Status Achieved   RUE 12     OT SHORT TERM GOAL #4   Title Pt will improve RUE grip strength by at least 3 lbs or more in order to manage clothing with more independence with toileting    Baseline 22.4    Time 4    Period Weeks    Status Achieved   25.7     OT SHORT TERM GOAL #5   Title Pt will perform all aspects of toileting with min A consistently    Time 4    Period Weeks    Status On-going   unable to get an accurate answer as family not present for session this day 11/18     OT SHORT TERM GOAL #6   Title Pt and spouse will report patient utilizing RUE for functional tasks 15% of the time in daily activities.    Time 4    Period Weeks    Status Achieved              OT Long Term Goals - 07/26/20 1243      OT LONG TERM GOAL #1   Title Pt will be independent with updated HEP  08/09/2020    Time 8    Period Weeks    Status New      OT LONG TERM GOAL #2   Title Pt will improve RUE shoulder flexion to 110 degrees in order to obtain lightweight object from overhead cabinet.    Time 8    Period Weeks    Status New      OT LONG TERM GOAL #3   Title Patient will demonstrate improved function use of RUE as evidenced by increasing Box and Blocks score to 17 blocks or more.    Time 8    Period Weeks    Status On-going   12     OT LONG TERM GOAL #4   Title Patient will perform UB dressing with supervision with verbal cues in order to increase indepedence with ADLs.    Time 8    Period Weeks    Status Achieved      OT LONG TERM GOAL #5   Title Pt will improve RUE grip strength by at least 6 lbs in order to manage clothing with more independence with toileting    Baseline 22.4 RUE    Time 8    Period Weeks    Status On-going   25.4     OT LONG TERM GOAL #6   Title Pt will perform all aspects of toileting with distant supervision for safety in order to increase independence with ADLs.    Time 8    Period Weeks    Status On-going      OT LONG TERM GOAL #7   Title Pt and spouse will report patient utilizing RUE for functional tasks 25% of the time in daily activities.    Time 8    Period Weeks    Status On-going                 Plan - 07/26/20 1247    Clinical Impression Statement Progress note written for period of 06/14/2020 - 07/26/2020 for 10th visit progress. Pt has met 5/6 STGs and is progressing towards LTGs. Pt continues to present wih deficits with RUE functional use, range of motion and strength and coordination. Pt with deficits with cognitive skills and visual perception impeding independence with ADLs nad IADLs. Skilled OT continues to be recommended to beneft patient and work towards listed areas of deficits    OT  Occupational Profile and History Detailed Assessment- Review of Records and additional review of physical, cognitive, psychosocial history related to current functional performance    Occupational performance deficits (Please refer to evaluation for details): IADL's;ADL's;Leisure;Social Participation    Body Structure / Function / Physical Skills ADL;Decreased knowledge of use of DME;Gait;Strength;Tone;GMC;Dexterity;Balance;Body mechanics;Proprioception;UE functional use;ROM;IADL;Coordination;FMC;Vision;Flexibility;Mobility    Cognitive Skills Attention;Problem Solve;Safety Awareness;Sequencing;Energy/Drive;Thought;Learn;Memory;Understand;Perception    Rehab Potential Good    Clinical Decision Making Limited treatment options, no task modification necessary    Comorbidities Affecting Occupational Performance: May have comorbidities impacting occupational performance    Modification or Assistance to Complete Evaluation  No modification of tasks or assist necessary to complete eval    OT Frequency 2x / week    OT Duration 8 weeks    OT Treatment/Interventions Self-care/ADL training;Moist Heat;Fluidtherapy;DME and/or AE instruction;Balance training;Gait Training;Therapeutic activities;Cognitive remediation/compensation;Therapeutic exercise;Ultrasound;Neuromuscular education;Passive range of motion;Visual/perceptual remediation/compensation;Functional Mobility Training;Electrical Stimulation;Energy conservation;Manual Therapy;Patient/family education    Plan functional mobility, RUE reach    Recommended Other Services Recommended ST referral - spouse asked to wait.    Consulted and Agree with Plan of Care Patient;Family  member/caregiver    Family Member Consulted mom           Patient will benefit from skilled therapeutic intervention in order to improve the following deficits and impairments:   Body Structure / Function / Physical Skills: ADL, Decreased knowledge of use of DME, Gait, Strength,  Tone, GMC, Dexterity, Balance, Body mechanics, Proprioception, UE functional use, ROM, IADL, Coordination, FMC, Vision, Flexibility, Mobility Cognitive Skills: Attention, Problem Solve, Safety Awareness, Sequencing, Energy/Drive, Thought, Learn, Memory, Understand, Perception     Visit Diagnosis: Other lack of coordination  Frontal lobe and executive function deficit  Attention and concentration deficit  Visuospatial deficit  Unsteadiness on feet  Muscle weakness (generalized)  Stiffness of right wrist, not elsewhere classified    Problem List Patient Active Problem List   Diagnosis Date Noted  . Colitis 03/15/2020  . AMS (altered mental status) 10/05/2019  . Acute metabolic encephalopathy 86/57/8469  . Headache, unspecified 09/28/2019  . Abnormality of gait 09/13/2019  . Diabetes mellitus with end-stage renal disease ([place]) 08/07/2019  . Benign hypertensive kidney disease with chronic kidney disease 08/07/2019  . Hypothyroidism, unspecified 07/15/2019  . Spastic hemiparesis ([place]) 07/12/2019  . Dysphagia, oropharyngeal phase 07/05/2019  . Allergy, unspecified, initial encounter 06/27/2019  . PAF (paroxysmal atrial fibrillation) ([place])   . Bacteremia   . Labile blood pressure   . Labile blood glucose   . Controlled type 2 diabetes mellitus with hyperglycemia, without long-term current use of insulin ([place])   . ESRD on dialysis ([place])   . Right sided weakness   . SDH (subdural hematoma) ([place])   . Chronic intracranial subdural hematoma ([facility]) 06/09/2019  . Multiple myeloma not having achieved remission ([place]) 06/09/2019  . ESRD on hemodialysis ([place]) 06/09/2019  . Essential hypertension 06/09/2019  . DM type 2 (diabetes mellitus, type 2) ([place]) 06/09/2019  . Seizure disorder ([place]) 06/09/2019  . Aspiration pneumonia ([place]) 06/09/2019  . History of bacteremia 06/09/2019  . Atrial fibrillation, chronic ([place]) 06/09/2019  . Bilateral kidney stones 05/31/2019  . Proteus infection  05/28/2019  . History of restrictive pulmonary disease 04/07/2019  . Multiple myeloma ([place]) 05/17/2018  . Goals of care, counseling/discussion 05/17/2018  . Partial idiopathic epilepsy with seizures of localized onset, intractable, without status epilepticus ([place]) 11/02/2017  . Iron deficiency anemia, unspecified 01/22/2017  . COPD (chronic obstructive pulmonary disease) ([place]) 11/11/2016  . CVA (cerebral vascular accident) ([place]) 11/11/2016  . GERD (gastroesophageal reflux disease) 11/11/2016  . GI bleed 11/11/2016  . 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  . Obesity (BMI 30-39.9) 11/11/2016  . Pulmonary hypertension ([place]) 11/11/2016  . Patent foramen ovale 11/11/2016  . Hyperparathyroidism, secondary renal ([place]) 11/11/2016  . VTE (venous thromboembolism) 11/11/2016  . Hypotension 11/11/2016  . Systemic hypertension 11/11/2016  . Seizure ([place]) 11/11/2016  . Other seizures ([place]) 07/25/2016  . Encounter for removal of sutures 05/27/2016  . Ingrown nail 03/03/2016  . Onychomycosis due to dermatophyte 03/03/2016  . Other acquired hammer toe 03/03/2016  . Hemiparesis affecting right side as late effect of cerebrovascular accident ([place]) 02/28/2016  . Aphasia complicating stroke 62/95/2841  . Seizures ([place]) 01/08/2016  . History of ischemic left MCA stroke 01/08/2016  . Right sided weakness 01/08/2016  . Atrial fibrillation ([place]) 01/08/2016  . Leukocytosis 01/08/2016  . Benign essential HTN 01/08/2016  . Anemia of chronic disease 01/08/2016  . Controlled type 2 diabetes mellitus with diabetic nephropathy, without long-term current use of insulin ([place]) 01/08/2016  . History of DVT (deep vein thrombosis) 01/08/2016  .  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 Hypocalcemia 12/26/2015  . Long term (current) use of anticoagulants 11/07/2015  . Aftercare including intermittent dialysis ([place]) 11/02/2015  . Other specified coagulation defects ([place]) 11/02/2015  . Complication of  vascular dialysis catheter 11/02/2015  . Diarrhea, unspecified 11/02/2015  . Fever, unspecified 11/02/2015  . Pruritus, unspecified 11/02/2015  . Shortness of breath 11/02/2015  . ESRD (end stage renal disease) on dialysis [facility])     Zachery Conch MOT, OTR/L  07/26/2020, 5:23 PM  [facility] [address] [place], [place], [address] Phone: [phone]   Fax:  [phone]  Name: Aashir [facility] MRN: [id number] Date of Birth: 02/24/1966

## 2020-07-27 DIAGNOSIS — Z992 Dependence on renal dialysis: Secondary | ICD-10-CM | POA: Diagnosis not present

## 2020-07-27 DIAGNOSIS — Z23 Encounter for immunization: Secondary | ICD-10-CM | POA: Diagnosis not present

## 2020-07-27 DIAGNOSIS — E1129 Type 2 diabetes mellitus with other diabetic kidney complication: Secondary | ICD-10-CM | POA: Diagnosis not present

## 2020-07-27 DIAGNOSIS — D631 Anemia in chronic kidney disease: Secondary | ICD-10-CM | POA: Diagnosis not present

## 2020-07-27 DIAGNOSIS — N186 End stage renal disease: Secondary | ICD-10-CM | POA: Diagnosis not present

## 2020-07-27 DIAGNOSIS — N2581 Secondary hyperparathyroidism of renal origin: Secondary | ICD-10-CM | POA: Diagnosis not present

## 2020-07-27 NOTE — Therapy (Signed)
[facility] [address] [place], [place], [address] Phone: [phone]   Fax:  [phone]  Physical Therapy Treatment  Patient Details  Name: Isaiah [name] [facility] MRN: [id number] Date of Birth: [birth year] Referring Provider (PT): Delice Lesch, MD   Encounter Date: 07/26/2020   PT End of Session - 07/27/20 1555    Visit Number 22    Number of Visits 34   per recert 80/11/4915   Date for PT Re-Evaluation 09/24/20    Authorization Type Medicare; KX starting at 07/27/2020 visit    Progress Note Due on Visit 30    PT Start Time 1318    PT Stop Time 1402    PT Time Calculation (min) 44 min    Equipment Utilized During Treatment Gait belt    Activity Tolerance Patient tolerated treatment well    Behavior During Therapy [facility] for tasks assessed/performed           Past Medical History:  Diagnosis Date  . A-fib ([place])   . Anemia   . Asthma   . DM type 2 (diabetes mellitus, type 2) ([place]) 06/09/2019  . ESRD (end stage renal disease) on dialysis ([place])   . Essential hypertension 06/09/2019  . GIB (gastrointestinal bleeding)    Recurrent episodes- 09/2014, 09/2015 and 05/2016  . Gout   . History of recent blood transfusion 10/27/14   2 Units PRBC's  . Hyperkalemia 07/2011  . Multiple myeloma ([place])   . OSA on CPAP   . Pulmonary embolism ([place]) 07/2011; 09/27/2014   a. Bilat PE 07/2011 - unclear cause, tx with 6 months Coumadin.;   . Seizure disorder ([place]) 06/09/2019  . Sepsis ([place])   . Sickle cell-thalassemia disease ([place])    a. Sickle cell trait.  . Sleep apnea   . Stroke ([place]) 09/2015   R-MCA, L-MCA, PCA and bilateral cerebellar complicated by DVT/PE  . Subdural hematoma ([place]) 05/2019    Past Surgical History:  Procedure Laterality Date  . AV FISTULA PLACEMENT Right 12/05/2015   Procedure: INSERTION OF ARTERIOVENOUS (AV) GORE-TEX GRAFT ARM;  Surgeon: Elam Dutch, MD;  Location: [place];  Service: Vascular;  Laterality:  Right;  . BONE MARROW BIOPSY    . CATARACT EXTRACTION  08/2019  . CATARACT EXTRACTION  09/2019  . CHOLECYSTECTOMY    . CHOLECYSTECTOMY  1990's?  . COLONOSCOPY WITH PROPOFOL N/A 01/22/2017   Procedure: COLONOSCOPY WITH PROPOFOL;  Surgeon: Wilford Corner, MD;  Location: [facility];  Service: Endoscopy;  Laterality: N/A;  . EYE SURGERY Right   . INSERTION OF DIALYSIS CATHETER Right 10/16/2015   Procedure: INSERTION OF PALINDROME DIALYSIS CATHETER ;  Surgeon: Elam Dutch, MD;  Location: [place];  Service: Vascular;  Laterality: Right;  . OTHER SURGICAL HISTORY     Retinal surgery  . PARS PLANA VITRECTOMY  02/17/2012   Procedure: PARS PLANA VITRECTOMY WITH 25 GAUGE;  Surgeon: Hayden Pedro, MD;  Location: [place];  Service: Ophthalmology;  Laterality: Right;  . PERIPHERAL VASCULAR CATHETERIZATION N/A 03/20/2016   Procedure: A/V Shuntogram/Fistulagram;  Surgeon: Conrad Sidney, MD;  Location: [facility];  Service: Cardiovascular;  Laterality: N/A;  . TEE WITHOUT CARDIOVERSION N/A 12/02/2016   Procedure: TRANSESOPHAGEAL ECHOCARDIOGRAM (TEE);  Surgeon: Larey Dresser, MD;  Location: [place] [facility];  Service: Cardiovascular;  Laterality: N/A;    There were no vitals filed for this visit.   Subjective Assessment - 07/26/20 1320    Subjective No new complaints, nothing new.  No falls.    Pertinent History multiple myeloma, ESRD on dialysis, DM type II, DVT with IVC filter, CVA, COPD, a-fib; chemo treatments down to 1x/month (next is August 17th)    Patient Stated Goals To get back to walk without the walker, back to his baseline    Currently in Pain? No/denies              [facility] PT Assessment - 07/26/20 1318      Berg Balance Test   Sit to Stand Able to stand  independently using hands    Standing Unsupported Able to stand 2 minutes with supervision    Sitting with Back Unsupported but Feet Supported on Floor or Stool Able to sit safely and securely 2 minutes    Stand to Sit  Controls descent by using hands    Transfers Able to transfer safely, definite need of hands    Standing Unsupported with Eyes Closed Able to stand 10 seconds with supervision    Standing Unsupported with Feet Together Able to place feet together independently and stand for 1 minute with supervision    From Standing, Reach Forward with Outstretched Arm Can reach forward >12 cm safely (5")    From Standing Position, Pick up Object from [place] to pick up shoe, needs supervision    From Standing Position, Turn to Look Behind Over each Shoulder Turn sideways only but maintains balance    Turn 360 Degrees Needs close supervision or verbal cueing    Standing Unsupported, Alternately Place Feet on Step/Stool Needs assistance to keep from falling or unable to try    Standing Unsupported, One Foot in Front Needs help to step but can hold 15 seconds    Standing on One Leg Tries to lift leg/unable to hold 3 seconds but remains standing independently    Total Score 33    Berg comment: Improved from 30/56 last check      Timed Up and Go Test   TUG Normal TUG    Normal TUG (seconds) 37.78   31.18; 25.50 (31.49 sec avg)   TUG Comments 31.49 sec avg; improved from 37 sec                         [facility] Adult PT Treatment/Exercise - 07/26/20 1318      Transfers   Transfers Sit to Stand;Stand to Sit    Sit to Stand 5: Supervision;With upper extremity assist;From bed;From chair/3-in-1    Sit to Stand Details Verbal cues for technique;Verbal cues for precautions/safety;Verbal cues for safe use of DME/AE    Sit to Stand Details (indicate cue type and reason) Cues for hand placement    Stand to Sit 4: Min guard;With upper extremity assist;To bed;To chair/3-in-1    Stand to Sit Details (indicate cue type and reason) Verbal cues for technique;Verbal cues for precautions/safety;Verbal cues for safe use of DME/AE      Ambulation/Gait   Ambulation/Gait Yes    Ambulation/Gait Assistance 4: Min  guard    Ambulation Distance (Feet) 300 Feet    Assistive device Rolling walker    Gait Pattern Step-through pattern;Trunk flexed;Poor foot clearance - left;Poor foot clearance - right    Ambulation Surface Level;Indoor    Gait velocity 18.32 sec = 1.79 ft/sec   23.47, 17.78     Knee/Hip Exercises: Aerobic   Nustep UE/LE's on level 4 for 8 minute with goal 50-55 steps per minute for strengthening and activity tolerance.  Spoke with wife briefly via phone prior to pt's visit; she reports having stair lift installed at home; she is aware that RW is the safest device and that he has functional mobility fluctuations, which may impact safety with gait.   PT Education - 07/27/20 1554    Education Details Discussed POC and progress towards goals    Person(s) Educated Patient    Methods Explanation    Comprehension Verbalized understanding            PT Short Term Goals - 07/03/20 1239      PT SHORT TERM GOAL #1   Title Pt will perform HEP independenlty for improved strength, balance and gait.    Baseline performs, but needs supervision for safety in standing    Time 4    Period Weeks    Status Partially Met    Target Date 06/28/20      PT SHORT TERM GOAL #2   Title Pt will improve gait velocity to at least 1.3 ft/sec for improved gait efficiency and safety.    Baseline 1.61f/s with RW 05/31/20; 1.46 ft/sec 07/03/2020    Time 4    Period Weeks    Status Achieved    Target Date 06/28/20             PT Long Term Goals - 07/27/20 1552      PT LONG TERM GOAL #1   Title Pt will be independent with progression of HEP for improved strength, blaance, gait.    Time 8    Period Weeks    Status On-going      PT LONG TERM GOAL #2   Title Pt will imrpove Berg Balance score to at least 33/56 for decreased fall risk.    Baseline 30/56 05/31/20; 33/56 11/18    Time 8    Period Weeks    Status Achieved      PT LONG TERM GOAL #3   Title Pt will improve TUG  score to less than or equal to 30 seconds for decreased fall risk.    Baseline 37 sec 05/31/20; 31.49 sec 07/26/2020    Time 8    Period Weeks    Status Not Met      PT LONG TERM GOAL #4   Title Pt will improve gait velocity to at least 1.8 ft/sec for improved gait velocity and decreased fall risk.    Baseline 1.12 ft/s 05/31/20; 1.79 ft/sec 11/18/22021    Time 8    Period Weeks    Status Achieved      PT LONG TERM GOAL #5   Title Pt will ascend/descent 12 steps with both handrails with supervision only, to negotiate steps at home.    Baseline not attempted 05/31/20; wife reports getting stair lift at home    Time 8    Period Weeks    Status On-going           Updated goals:  PT Short Term Goals - 07/27/20 1609      PT SHORT TERM GOAL #1   Title Pt will perform progression of  HEP independenlty for improved strength, balance and gait.  TARGET 08/24/2020    Time 4    Period Weeks    Status Revised      PT SHORT TERM GOAL #2   Title Pt will improve gait velocity to at least 2 ft/sec for improved gait efficiency and safety.    Baseline 1.11fs with RW 05/31/20; 1.46 ft/sec 07/03/2020; 1.79 ft/sec  07/26/2020    Time 4    Period Weeks    Status Revised    Target Date 06/28/20      PT SHORT TERM GOAL #3   Title Pt will perform sit<>stand transfers 5 reps no LOB, with UE support, mod independently for improved safety and strength wtih transfrs.    Time 4    Period Weeks           PT Long Term Goals - 07/27/20 1612      PT LONG TERM GOAL #1   Title Pt will be independent with final progression of HEP for improved strength, blaance, gait.  TARGET 09/07/2020    Time 6    Period Weeks    Status On-going      PT LONG TERM GOAL #2   Title Pt will imrpove Berg Balance score to at least 36/56 for decreased fall risk.    Baseline 30/56 05/31/20; 33/56 11/18    Time 6    Period Weeks    Status Revised      PT LONG TERM GOAL #3   Title Pt will improve TUG score to less than  or equal to 30 seconds for decreased fall risk.    Baseline 37 sec 05/31/20; 31.49 sec 07/26/2020    Time 6    Period Weeks    Status On-going      PT LONG TERM GOAL #4   Title Pt will verbalize understanding of plans for continued community fitness upon d/c from PT.    Time 6    Period Weeks    Status Revised      PT LONG TERM GOAL #5   Title Pt will ascend/descent 12 steps with both handrails with supervision only, to negotiate steps at home.    Baseline not attempted 05/31/20; wife reports getting stair lift at home    Time 6    Period Weeks    Status On-going                Plan - 07/27/20 1556    Clinical Impression Statement Assessed LTGs this visit, with pt continueing to make slow, steady progress.  He has met LTG 2 for improved Berg score and LTG 4 for improved gait velocity.  LTG 3 not met, but TUG score improved to 31.49 sec (avg), just not to goal level of 30 seconds.  LTG 1 for HEP is ongoing.  LTG 5 for stair negotiation is ongoing (by phonecall, wife reports they have gotten a stair lift at home for days when pt is having trouble with stair negoitation).  Pt is conitnueing to progress with functional mobility, but dialysis and fluctuations in functional mobility post infusion treatments may work to slow performance.  Even with improvement, he remains at fall risk per TUG and Berg scores.  He continues to benefit from skilled PT to further address strength, balance, and gait training towards improved functional mobility and decreased fall risk.    Personal Factors and Comorbidities Comorbidity 3+    Comorbidities PMH:  multiple myeloma, ESRD on dialysis, DM type II, DVT with IVC filter, CVA, COPD, a-fib    Examination-Activity Limitations Locomotion Level;Transfers;Stairs;Stand    Examination-Participation Restrictions Community Activity    Stability/Clinical Decision Making Evolving/Moderate complexity    Rehab Potential Good    PT Frequency 2x / week    PT Duration 6  weeks   per recert 32/35/5732   PT Treatment/Interventions ADLs/Self Care Home Management;DME Instruction;Neuromuscular re-education;Balance training;Therapeutic exercise;Therapeutic activities;Functional  mobility training;Stair training;Gait training;Patient/family education    PT Next Visit Plan Continue gait, obstacle negotiation, SLS, functional BLE strengthening; standing balance; may need to review HEP and update as appropriate.  Use of machines for strengthening.    PT Home Exercise Plan Access Code [email or web address]    Consulted and Agree with Plan of Care Patient           Patient will benefit from skilled therapeutic intervention in order to improve the following deficits and impairments:  Abnormal gait, Difficulty walking, Decreased safety awareness, Decreased balance, Decreased mobility, Decreased strength  Visit Diagnosis: Unsteadiness on feet  Other abnormalities of gait and mobility  Muscle weakness (generalized)     Problem List Patient Active Problem List   Diagnosis Date Noted  . Colitis 03/15/2020  . AMS (altered mental status) 10/05/2019  . Acute metabolic encephalopathy 62/13/0865  . Headache, unspecified 09/28/2019  . Abnormality of gait 09/13/2019  . Diabetes mellitus with end-stage renal disease ([place]) 08/07/2019  . Benign hypertensive kidney disease with chronic kidney disease 08/07/2019  . Hypothyroidism, unspecified 07/15/2019  . Spastic hemiparesis ([place]) 07/12/2019  . Dysphagia, oropharyngeal phase 07/05/2019  . Allergy, unspecified, initial encounter 06/27/2019  . PAF (paroxysmal atrial fibrillation) ([place])   . Bacteremia   . Labile blood pressure   . Labile blood glucose   . Controlled type 2 diabetes mellitus with hyperglycemia, without long-term current use of insulin ([place])   . ESRD on dialysis ([place])   . Right sided weakness   . SDH (subdural hematoma) ([place])   . Chronic intracranial subdural hematoma ([facility]) 06/09/2019  . Multiple myeloma not having  achieved remission ([place]) 06/09/2019  . ESRD on hemodialysis ([place]) 06/09/2019  . Essential hypertension 06/09/2019  . DM type 2 (diabetes mellitus, type 2) ([place]) 06/09/2019  . Seizure disorder ([place]) 06/09/2019  . Aspiration pneumonia ([place]) 06/09/2019  . History of bacteremia 06/09/2019  . Atrial fibrillation, chronic ([place]) 06/09/2019  . Bilateral kidney stones 05/31/2019  . Proteus infection 05/28/2019  . History of restrictive pulmonary disease 04/07/2019  . Multiple myeloma ([place]) 05/17/2018  . Goals of care, counseling/discussion 05/17/2018  . Partial idiopathic epilepsy with seizures of localized onset, intractable, without status epilepticus ([place]) 11/02/2017  . Iron deficiency anemia, unspecified 01/22/2017  . COPD (chronic obstructive pulmonary disease) ([place]) 11/11/2016  . CVA (cerebral vascular accident) ([place]) 11/11/2016  . GERD (gastroesophageal reflux disease) 11/11/2016  . GI bleed 11/11/2016  . 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  . Obesity (BMI 30-39.9) 11/11/2016  . Pulmonary hypertension ([place]) 11/11/2016  . Patent foramen ovale 11/11/2016  . Hyperparathyroidism, secondary renal ([place]) 11/11/2016  . VTE (venous thromboembolism) 11/11/2016  . Hypotension 11/11/2016  . Systemic hypertension 11/11/2016  . Seizure ([place]) 11/11/2016  . Other seizures ([place]) 07/25/2016  . Encounter for removal of sutures 05/27/2016  . Ingrown nail 03/03/2016  . Onychomycosis due to dermatophyte 03/03/2016  . Other acquired hammer toe 03/03/2016  . Hemiparesis affecting right side as late effect of cerebrovascular accident ([place]) 02/28/2016  . Aphasia complicating stroke 78/46/9629  . Seizures ([place]) 01/08/2016  . History of ischemic left MCA stroke 01/08/2016  . Right sided weakness 01/08/2016  . Atrial fibrillation ([place]) 01/08/2016  . Leukocytosis 01/08/2016  . Benign essential HTN 01/08/2016  . Anemia of chronic disease 01/08/2016  . Controlled type 2 diabetes mellitus with  diabetic nephropathy, without long-term current use of insulin ([place]) 01/08/2016  . History of DVT (deep vein thrombosis) 01/08/2016  . 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 Hypocalcemia  12/26/2015  . Long term (current) use of anticoagulants 11/07/2015  . Aftercare including intermittent dialysis ([place]) 11/02/2015  . Other specified coagulation defects ([place]) 11/02/2015  . Complication of vascular dialysis catheter 11/02/2015  . Diarrhea, unspecified 11/02/2015  . Fever, unspecified 11/02/2015  . Pruritus, unspecified 11/02/2015  . Shortness of breath 11/02/2015  . ESRD (end stage renal disease) on dialysis [facility])     Esma Kilts W. 07/27/2020, 4:05 PM  Frazier Butt., PT   [facility] [address] [place], [place], [address] Phone: [phone]   Fax:  [phone]  Name: Isaiah [name] [facility] MRN: [id number] Date of Birth: 01-31-1966

## 2020-07-27 NOTE — Addendum Note (Signed)
Addended by: Frazier Butt on: 07/27/2020 04:17 PM   Modules accepted: Orders

## 2020-07-29 DIAGNOSIS — D631 Anemia in chronic kidney disease: Secondary | ICD-10-CM | POA: Diagnosis not present

## 2020-07-29 DIAGNOSIS — E1129 Type 2 diabetes mellitus with other diabetic kidney complication: Secondary | ICD-10-CM | POA: Diagnosis not present

## 2020-07-29 DIAGNOSIS — N186 End stage renal disease: Secondary | ICD-10-CM | POA: Diagnosis not present

## 2020-07-29 DIAGNOSIS — Z992 Dependence on renal dialysis: Secondary | ICD-10-CM | POA: Diagnosis not present

## 2020-07-29 DIAGNOSIS — N2581 Secondary hyperparathyroidism of renal origin: Secondary | ICD-10-CM | POA: Diagnosis not present

## 2020-07-29 DIAGNOSIS — Z23 Encounter for immunization: Secondary | ICD-10-CM | POA: Diagnosis not present

## 2020-07-31 ENCOUNTER — Ambulatory Visit: Payer: Medicare Other | Admitting: Physical Therapy

## 2020-07-31 ENCOUNTER — Ambulatory Visit: Payer: Medicare Other | Admitting: Occupational Therapy

## 2020-07-31 DIAGNOSIS — E1129 Type 2 diabetes mellitus with other diabetic kidney complication: Secondary | ICD-10-CM | POA: Diagnosis not present

## 2020-07-31 DIAGNOSIS — Z23 Encounter for immunization: Secondary | ICD-10-CM | POA: Diagnosis not present

## 2020-07-31 DIAGNOSIS — E109 Type 1 diabetes mellitus without complications: Secondary | ICD-10-CM | POA: Diagnosis not present

## 2020-07-31 DIAGNOSIS — D631 Anemia in chronic kidney disease: Secondary | ICD-10-CM | POA: Diagnosis not present

## 2020-07-31 DIAGNOSIS — N186 End stage renal disease: Secondary | ICD-10-CM | POA: Diagnosis not present

## 2020-07-31 DIAGNOSIS — N2581 Secondary hyperparathyroidism of renal origin: Secondary | ICD-10-CM | POA: Diagnosis not present

## 2020-07-31 DIAGNOSIS — Z992 Dependence on renal dialysis: Secondary | ICD-10-CM | POA: Diagnosis not present

## 2020-08-01 MED FILL — AURYXIA 210 MG TABLET: 1 GM | 30 days supply | Qty: 90 | Fill #6

## 2020-08-03 DIAGNOSIS — N2581 Secondary hyperparathyroidism of renal origin: Secondary | ICD-10-CM | POA: Diagnosis not present

## 2020-08-03 DIAGNOSIS — Z23 Encounter for immunization: Secondary | ICD-10-CM | POA: Diagnosis not present

## 2020-08-03 DIAGNOSIS — D631 Anemia in chronic kidney disease: Secondary | ICD-10-CM | POA: Diagnosis not present

## 2020-08-03 DIAGNOSIS — N186 End stage renal disease: Secondary | ICD-10-CM | POA: Diagnosis not present

## 2020-08-03 DIAGNOSIS — E1129 Type 2 diabetes mellitus with other diabetic kidney complication: Secondary | ICD-10-CM | POA: Diagnosis not present

## 2020-08-03 DIAGNOSIS — Z992 Dependence on renal dialysis: Secondary | ICD-10-CM | POA: Diagnosis not present

## 2020-08-06 DIAGNOSIS — D631 Anemia in chronic kidney disease: Secondary | ICD-10-CM | POA: Diagnosis not present

## 2020-08-06 DIAGNOSIS — N2581 Secondary hyperparathyroidism of renal origin: Secondary | ICD-10-CM | POA: Diagnosis not present

## 2020-08-06 DIAGNOSIS — Z23 Encounter for immunization: Secondary | ICD-10-CM | POA: Diagnosis not present

## 2020-08-06 DIAGNOSIS — Z992 Dependence on renal dialysis: Secondary | ICD-10-CM | POA: Diagnosis not present

## 2020-08-06 DIAGNOSIS — E1129 Type 2 diabetes mellitus with other diabetic kidney complication: Secondary | ICD-10-CM | POA: Diagnosis not present

## 2020-08-06 DIAGNOSIS — N186 End stage renal disease: Secondary | ICD-10-CM | POA: Diagnosis not present

## 2020-08-07 ENCOUNTER — Ambulatory Visit: Payer: Medicare Other | Admitting: Occupational Therapy

## 2020-08-07 ENCOUNTER — Other Ambulatory Visit: Payer: Self-pay

## 2020-08-07 ENCOUNTER — Encounter: Payer: Self-pay | Admitting: Occupational Therapy

## 2020-08-07 ENCOUNTER — Ambulatory Visit: Payer: Medicare Other | Admitting: Physical Therapy

## 2020-08-07 DIAGNOSIS — R2689 Other abnormalities of gait and mobility: Secondary | ICD-10-CM

## 2020-08-07 DIAGNOSIS — R278 Other lack of coordination: Secondary | ICD-10-CM

## 2020-08-07 DIAGNOSIS — M25631 Stiffness of right wrist, not elsewhere classified: Secondary | ICD-10-CM | POA: Diagnosis not present

## 2020-08-07 DIAGNOSIS — R41840 Attention and concentration deficit: Secondary | ICD-10-CM | POA: Diagnosis not present

## 2020-08-07 DIAGNOSIS — R41844 Frontal lobe and executive function deficit: Secondary | ICD-10-CM

## 2020-08-07 DIAGNOSIS — R41842 Visuospatial deficit: Secondary | ICD-10-CM | POA: Diagnosis not present

## 2020-08-07 DIAGNOSIS — R2681 Unsteadiness on feet: Secondary | ICD-10-CM | POA: Diagnosis not present

## 2020-08-07 DIAGNOSIS — M6281 Muscle weakness (generalized): Secondary | ICD-10-CM | POA: Diagnosis not present

## 2020-08-07 NOTE — Therapy (Signed)
[name] [address] [name], [place], [address] Phone: [phone]   Fax:  [phone]  Occupational Therapy Treatment  Patient Details  Name: Isaiah [name] [facility] MRN: [id number] Date of Birth: 08-Apr-1966 Referring Provider (OT): Posey Pronto, Ankit A.   Encounter Date: 08/07/2020   OT End of Session - 08/07/20 1319    Visit Number 11    Number of Visits 20    Date for OT Re-Evaluation 09/04/20    Authorization Type Medicare    Authorization Time Period Week 7/8 (11/30) OOP has been met. No financial responsibility. Renewal on 11/30 for 2x/week for 4 weeks    Authorization - Visit Number 11    Progress Note Due on Visit 20    OT Start Time 1315    OT Stop Time 1400    OT Time Calculation (min) 45 min    Activity Tolerance Patient tolerated treatment well    Behavior During Therapy WFL for tasks assessed/performed           Past Medical History:  Diagnosis Date  . A-fib ([place])   . Anemia   . Asthma   . DM type 2 (diabetes mellitus, type 2) ([place]) 06/09/2019  . ESRD (end stage renal disease) on dialysis ([place])   . Essential hypertension 06/09/2019  . GIB (gastrointestinal bleeding)    Recurrent episodes- 09/2014, 09/2015 and 05/2016  . Gout   . History of recent blood transfusion 10/27/14   2 Units PRBC's  . Hyperkalemia 07/2011  . Multiple myeloma ([place])   . OSA on CPAP   . Pulmonary embolism ([place]) 07/2011; 09/27/2014   a. Bilat PE 07/2011 - unclear cause, tx with 6 months Coumadin.;   . Seizure disorder ([place]) 06/09/2019  . Sepsis ([place])   . Sickle cell-thalassemia disease ([place])    a. Sickle cell trait.  . Sleep apnea   . Stroke ([place]) 09/2015   R-MCA, L-MCA, PCA and bilateral cerebellar complicated by DVT/PE  . Subdural hematoma ([place]) 05/2019    Past Surgical History:  Procedure Laterality Date  . AV FISTULA PLACEMENT Right 12/05/2015   Procedure: INSERTION OF ARTERIOVENOUS (AV) GORE-TEX GRAFT ARM;  Surgeon:  Elam Dutch, MD;  Location: [place];  Service: Vascular;  Laterality: Right;  . BONE MARROW BIOPSY    . CATARACT EXTRACTION  08/2019  . CATARACT EXTRACTION  09/2019  . CHOLECYSTECTOMY    . CHOLECYSTECTOMY  1990's?  . COLONOSCOPY WITH PROPOFOL N/A 01/22/2017   Procedure: COLONOSCOPY WITH PROPOFOL;  Surgeon: Wilford Corner, MD;  Location: [facility];  Service: Endoscopy;  Laterality: N/A;  . EYE SURGERY Right   . INSERTION OF DIALYSIS CATHETER Right 10/16/2015   Procedure: INSERTION OF PALINDROME DIALYSIS CATHETER ;  Surgeon: Elam Dutch, MD;  Location: [place];  Service: Vascular;  Laterality: Right;  . OTHER SURGICAL HISTORY     Retinal surgery  . PARS PLANA VITRECTOMY  02/17/2012   Procedure: PARS PLANA VITRECTOMY WITH 25 GAUGE;  Surgeon: Hayden Pedro, MD;  Location: [place];  Service: Ophthalmology;  Laterality: Right;  . PERIPHERAL VASCULAR CATHETERIZATION N/A 03/20/2016   Procedure: A/V Shuntogram/Fistulagram;  Surgeon: Conrad Bronte, MD;  Location: [place] [facility];  Service: Cardiovascular;  Laterality: N/A;  . TEE WITHOUT CARDIOVERSION N/A 12/02/2016   Procedure: TRANSESOPHAGEAL ECHOCARDIOGRAM (TEE);  Surgeon: Larey Dresser, MD;  Location: [place] [facility] [place];  Service: Cardiovascular;  Laterality: N/A;    There were no vitals filed for this visit.  Subjective Assessment - 08/07/20 1322    Subjective  Pt denies any pain. Pt brought splint in for modifications. Pt agreeable to review of splint with fit and function.    Patient is accompanied by: Family member   spouse   Pertinent History multiple myeloma (currently undergoing chemo treatment), DM, CVA, DVT, seizure disorder, SDH, ESRD on dialysis, and history of SDH x 2    Limitations Fall. No Driving. RUE fistula-dialysis; hx of seizures    Patient Stated Goals write better with his right hand, pick things up better with R hand    Currently in Pain? No/denies           Treatment: Pt brought splint in for modifications  and questions regarding 5th/little finger abducting secondary to tone. Education provided on fit and function of splint. Pt verbalized understanding.  Session targeted RUE functional use and grasp/release with differing sized objects.  Place large pegs in pegboard with RUE. Pt required increased time and had some deficits with visual spatial and skills for finding holes and placing pegs in correct place. Pt most limited by vision with activity however grasp with objects is still impaired.  Stringing large beads with bilateral UE. Pt held block/bead in LUE and would string lace with RUE. Increased time required and min difficulty.   Semi circle cylindrical pegs with RUE with increased time. Pt with greater ease with vision with seeing holes in semicircle peg board than large peg blue foam board.  Possible left inattention and/or field deficit with activity noted.                      Pt walked out to meet spouse in the lobby with CGA                     OT Short Term Goals - 08/07/20 1420      OT SHORT TERM GOAL #1   Title Patient and caregiver will be independent with HEP 07/12/20    Time 4    Period Weeks    Status Achieved    Target Date 07/12/20      OT SHORT TERM GOAL #2   Title Patient will perform UB dressing with min A in order to increase independence with ADLs    Time 4    Period Weeks    Status Achieved      OT SHORT TERM GOAL #3   Title Patient will demonstrate improved function use of RUE as evidenced by increasing Box and Blocks score to 12 blocks or more.    Baseline RUE 9    Time 4    Period Weeks    Status Achieved   RUE 12     OT SHORT TERM GOAL #4   Title Pt will improve RUE grip strength by at least 3 lbs or more in order to manage clothing with more independence with toileting    Baseline 22.4    Time 4    Period Weeks    Status Achieved   25.7     OT SHORT TERM GOAL #5   Title Pt will perform all aspects of toileting with min A  consistently    Time 4    Period Weeks    Status On-going   unable to get an accurate answer as family not present for session this day 11/18     OT SHORT TERM GOAL #6   Title Pt and spouse will report patient utilizing  RUE for functional tasks 15% of the time in daily activities.    Time 4    Period Weeks    Status Achieved             OT Long Term Goals - 08/07/20 1420      OT LONG TERM GOAL #1   Title Pt will be independent with updated HEP 08/09/2020    Time 8    Period Weeks    Status New      OT LONG TERM GOAL #2   Title Pt will improve RUE shoulder flexion to 110 degrees in order to obtain lightweight object from overhead cabinet.    Time 8    Period Weeks    Status New      OT LONG TERM GOAL #3   Title Patient will demonstrate improved function use of RUE as evidenced by increasing Box and Blocks score to 17 blocks or more.    Time 8    Period Weeks    Status On-going   12     OT LONG TERM GOAL #4   Title Patient will perform UB dressing with supervision with verbal cues in order to increase indepedence with ADLs.    Time 8    Period Weeks    Status Achieved      OT LONG TERM GOAL #5   Title Pt will improve RUE grip strength by at least 6 lbs in order to manage clothing with more independence with toileting    Baseline 22.4 RUE    Time 8    Period Weeks    Status On-going   25.4     OT LONG TERM GOAL #6   Title Pt will perform all aspects of toileting with distant supervision for safety in order to increase independence with ADLs.    Time 8    Period Weeks    Status On-going      OT LONG TERM GOAL #7   Title Pt and spouse will report patient utilizing RUE for functional tasks 25% of the time in daily activities.    Time 8    Period Weeks    Status On-going                 Plan - 08/07/20 1320    Clinical Impression Statement Progress note written for period of 06/14/2020 - 07/26/2020 for 10th visit progress on 07/26/20. Renewal included on  08/07/20 for serving for continuing plan of care for patient. Pt has met 5/6 STGs and is progressing towards LTGs. Pt continues to present wih deficits with RUE functional use, range of motion and strength and coordination. Pt with deficits with cognitive skills and visual perception impeding independence with ADLs nad IADLs. Skilled OT continues to be recommended to beneft patient and work towards listed areas of deficits    OT Occupational Profile and History Detailed Assessment- Review of Records and additional review of physical, cognitive, psychosocial history related to current functional performance    Occupational performance deficits (Please refer to evaluation for details): IADL's;ADL's;Leisure;Social Participation    Body Structure / Function / Physical Skills ADL;Decreased knowledge of use of DME;Gait;Strength;Tone;GMC;Dexterity;Balance;Body mechanics;Proprioception;UE functional use;ROM;IADL;Coordination;FMC;Vision;Flexibility;Mobility    Cognitive Skills Attention;Problem Solve;Safety Awareness;Sequencing;Energy/Drive;Thought;Learn;Memory;Understand;Perception    Rehab Potential Good    Clinical Decision Making Limited treatment options, no task modification necessary    Comorbidities Affecting Occupational Performance: May have comorbidities impacting occupational performance    Modification or Assistance to Complete Evaluation  No modification of tasks or  assist necessary to complete eval    OT Frequency 2x / week    OT Duration 8 weeks    OT Treatment/Interventions Self-care/ADL training;Moist Heat;Fluidtherapy;DME and/or AE instruction;Balance training;Gait Training;Therapeutic activities;Cognitive remediation/compensation;Therapeutic exercise;Ultrasound;Neuromuscular education;Passive range of motion;Visual/perceptual remediation/compensation;Functional Mobility Training;Electrical Stimulation;Energy conservation;Manual Therapy;Patient/family education    Plan renewal completed on  08/07/20    Recommended Other Services Recommended ST referral - spouse asked to wait.    Consulted and Agree with Plan of Care Patient;Family member/caregiver    Family Member Consulted mom           Patient will benefit from skilled therapeutic intervention in order to improve the following deficits and impairments:   Body Structure / Function / Physical Skills: ADL, Decreased knowledge of use of DME, Gait, Strength, Tone, GMC, Dexterity, Balance, Body mechanics, Proprioception, UE functional use, ROM, IADL, Coordination, FMC, Vision, Flexibility, Mobility Cognitive Skills: Attention, Problem Solve, Safety Awareness, Sequencing, Energy/Drive, Thought, Learn, Memory, Understand, Perception     Visit Diagnosis: Other lack of coordination - Plan: Ot plan of care cert/re-cert  Frontal lobe and executive function deficit - Plan: Ot plan of care cert/re-cert  Attention and concentration deficit - Plan: Ot plan of care cert/re-cert  Visuospatial deficit - Plan: Ot plan of care cert/re-cert  Muscle weakness (generalized) - Plan: Ot plan of care cert/re-cert  Unsteadiness on feet - Plan: Ot plan of care cert/re-cert  Stiffness of right wrist, not elsewhere classified - Plan: Ot plan of care cert/re-cert    Problem List Patient Active Problem List   Diagnosis Date Noted  . Colitis 03/15/2020  . AMS (altered mental status) 10/05/2019  . Acute metabolic encephalopathy 62/83/6629  . Headache, unspecified 09/28/2019  . Abnormality of gait 09/13/2019  . Diabetes mellitus with end-stage renal disease ([place]) 08/07/2019  . Benign hypertensive kidney disease with chronic kidney disease 08/07/2019  . Hypothyroidism, unspecified 07/15/2019  . Spastic hemiparesis ([place]) 07/12/2019  . Dysphagia, oropharyngeal phase 07/05/2019  . Allergy, unspecified, initial encounter 06/27/2019  . PAF (paroxysmal atrial fibrillation) ([place])   . Bacteremia   . Labile blood pressure   . Labile blood glucose    . Controlled type 2 diabetes mellitus with hyperglycemia, without long-term current use of insulin ([place])   . ESRD on dialysis ([place])   . Right sided weakness   . SDH (subdural hematoma) ([place])   . Chronic intracranial subdural hematoma ([facility]) 06/09/2019  . Multiple myeloma not having achieved remission ([place]) 06/09/2019  . ESRD on hemodialysis ([place]) 06/09/2019  . Essential hypertension 06/09/2019  . DM type 2 (diabetes mellitus, type 2) ([place]) 06/09/2019  . Seizure disorder ([place]) 06/09/2019  . Aspiration pneumonia ([place]) 06/09/2019  . History of bacteremia 06/09/2019  . Atrial fibrillation, chronic ([place]) 06/09/2019  . Bilateral kidney stones 05/31/2019  . Proteus infection 05/28/2019  . History of restrictive pulmonary disease 04/07/2019  . Multiple myeloma ([place]) 05/17/2018  . Goals of care, counseling/discussion 05/17/2018  . Partial idiopathic epilepsy with seizures of localized onset, intractable, without status epilepticus ([place]) 11/02/2017  . Iron deficiency anemia, unspecified 01/22/2017  . COPD (chronic obstructive pulmonary disease) ([place]) 11/11/2016  . CVA (cerebral vascular accident) ([place]) 11/11/2016  . GERD (gastroesophageal reflux disease) 11/11/2016  . GI bleed 11/11/2016  . 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  . Obesity (BMI 30-39.9) 11/11/2016  . Pulmonary hypertension ([place]) 11/11/2016  . Patent foramen ovale 11/11/2016  . Hyperparathyroidism, secondary renal ([place]) 11/11/2016  . VTE (venous thromboembolism) 11/11/2016  . Hypotension 11/11/2016  . Systemic hypertension  11/11/2016  . Seizure ([place]) 11/11/2016  . Other seizures ([place]) 07/25/2016  . Encounter for removal of sutures 05/27/2016  . Ingrown nail 03/03/2016  . Onychomycosis due to dermatophyte 03/03/2016  . Other acquired hammer toe 03/03/2016  . Hemiparesis affecting right side as late effect of cerebrovascular accident ([place]) 02/28/2016  . Aphasia complicating stroke 82/42/9980  . Seizures ([place])  01/08/2016  . History of ischemic left MCA stroke 01/08/2016  . Right sided weakness 01/08/2016  . Atrial fibrillation ([place]) 01/08/2016  . Leukocytosis 01/08/2016  . Benign essential HTN 01/08/2016  . Anemia of chronic disease 01/08/2016  . Controlled type 2 diabetes mellitus with diabetic nephropathy, without long-term current use of insulin ([place]) 01/08/2016  . History of DVT (deep vein thrombosis) 01/08/2016  . 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 Hypocalcemia 12/26/2015  . Long term (current) use of anticoagulants 11/07/2015  . Aftercare including intermittent dialysis ([place]) 11/02/2015  . Other specified coagulation defects ([place]) 11/02/2015  . Complication of vascular dialysis catheter 11/02/2015  . Diarrhea, unspecified 11/02/2015  . Fever, unspecified 11/02/2015  . Pruritus, unspecified 11/02/2015  . Shortness of breath 11/02/2015  . ESRD (end stage renal disease) on dialysis [facility])     Zachery Conch MOT, OTR/L  08/07/2020, 2:24 PM  [facility] [address] [place], [place], [address] Phone: [phone]   Fax:  [phone]  Name: Shantanu Strauch [name] MRN: [id number] Date of Birth: [birth year]

## 2020-08-08 DIAGNOSIS — N2581 Secondary hyperparathyroidism of renal origin: Secondary | ICD-10-CM | POA: Diagnosis not present

## 2020-08-08 DIAGNOSIS — Z992 Dependence on renal dialysis: Secondary | ICD-10-CM | POA: Diagnosis not present

## 2020-08-08 DIAGNOSIS — E1129 Type 2 diabetes mellitus with other diabetic kidney complication: Secondary | ICD-10-CM | POA: Diagnosis not present

## 2020-08-08 DIAGNOSIS — E1122 Type 2 diabetes mellitus with diabetic chronic kidney disease: Secondary | ICD-10-CM | POA: Diagnosis not present

## 2020-08-08 DIAGNOSIS — N186 End stage renal disease: Secondary | ICD-10-CM | POA: Diagnosis not present

## 2020-08-08 DIAGNOSIS — D631 Anemia in chronic kidney disease: Secondary | ICD-10-CM | POA: Diagnosis not present

## 2020-08-08 NOTE — Therapy (Addendum)
[facility] [address] [place], [place], [address] Phone: [phone]   Fax:  [phone]  Physical Therapy Treatment  Patient Details  Name: Isaiah [name] [facility] MRN: [id number] Date of Birth: 31-Dec-1965 Referring Provider (PT): Delice Lesch, MD   Encounter Date: 08/07/2020   PT End of Session - 08/08/20 0706    Visit Number 23    Number of Visits 34   per recert 47/05/6282   Date for PT Re-Evaluation 09/24/20    Authorization Type Medicare; KX starting at 07/27/2020 visit    Progress Note Due on Visit 30    PT Start Time 6629    PT Stop Time 1315    PT Time Calculation (min) 40 min    Equipment Utilized During Treatment Gait belt    Activity Tolerance Patient tolerated treatment well    Behavior During Therapy [facility] for tasks assessed/performed           Past Medical History:  Diagnosis Date  . A-fib ([place])   . Anemia   . Asthma   . DM type 2 (diabetes mellitus, type 2) ([place]) 06/09/2019  . ESRD (end stage renal disease) on dialysis ([place])   . Essential hypertension 06/09/2019  . GIB (gastrointestinal bleeding)    Recurrent episodes- 09/2014, 09/2015 and 05/2016  . Gout   . History of recent blood transfusion 10/27/14   2 Units PRBC's  . Hyperkalemia 07/2011  . Multiple myeloma ([place])   . OSA on CPAP   . Pulmonary embolism ([place]) 07/2011; 09/27/2014   a. Bilat PE 07/2011 - unclear cause, tx with 6 months Coumadin.;   . Seizure disorder ([place]) 06/09/2019  . Sepsis ([place])   . Sickle cell-thalassemia disease ([place])    a. Sickle cell trait.  . Sleep apnea   . Stroke ([place]) 09/2015   R-MCA, L-MCA, PCA and bilateral cerebellar complicated by DVT/PE  . Subdural hematoma ([place]) 05/2019    Past Surgical History:  Procedure Laterality Date  . AV FISTULA PLACEMENT Right 12/05/2015   Procedure: INSERTION OF ARTERIOVENOUS (AV) GORE-TEX GRAFT ARM;  Surgeon: Elam Dutch, MD;  Location: [facility];  Service: Vascular;  Laterality:  Right;  . BONE MARROW BIOPSY    . CATARACT EXTRACTION  08/2019  . CATARACT EXTRACTION  09/2019  . CHOLECYSTECTOMY    . CHOLECYSTECTOMY  1990's?  . COLONOSCOPY WITH PROPOFOL N/A 01/22/2017   Procedure: COLONOSCOPY WITH PROPOFOL;  Surgeon: Wilford Corner, MD;  Location: [facility];  Service: Endoscopy;  Laterality: N/A;  . EYE SURGERY Right   . INSERTION OF DIALYSIS CATHETER Right 10/16/2015   Procedure: INSERTION OF PALINDROME DIALYSIS CATHETER ;  Surgeon: Elam Dutch, MD;  Location: [facility];  Service: Vascular;  Laterality: Right;  . OTHER SURGICAL HISTORY     Retinal surgery  . PARS PLANA VITRECTOMY  02/17/2012   Procedure: PARS PLANA VITRECTOMY WITH 25 GAUGE;  Surgeon: Hayden Pedro, MD;  Location: [facility];  Service: Ophthalmology;  Laterality: Right;  . PERIPHERAL VASCULAR CATHETERIZATION N/A 03/20/2016   Procedure: A/V Shuntogram/Fistulagram;  Surgeon: Conrad Lefors, MD;  Location: [facility];  Service: Cardiovascular;  Laterality: N/A;  . TEE WITHOUT CARDIOVERSION N/A 12/02/2016   Procedure: TRANSESOPHAGEAL ECHOCARDIOGRAM (TEE);  Surgeon: Larey Dresser, MD;  Location: [facility];  Service: Cardiovascular;  Laterality: N/A;    There were no vitals filed for this visit.   Subjective Assessment - 08/08/20 0658    Subjective No changes, no complaints or  falls.    Pertinent History multiple myeloma, ESRD on dialysis, DM type II, DVT with IVC filter, CVA, COPD, a-fib; chemo treatments down to 1x/month (next is August 17th)    Patient Stated Goals To get back to walk without the walker, back to his baseline    Currently in Pain? No/denies                             [name] Adult PT Treatment/Exercise - 08/07/20 1250      Transfers   Transfers Sit to Stand;Stand to Sit    Sit to Stand 5: Supervision;With upper extremity assist;From bed;From chair/3-in-1    Stand to Sit 4: Min guard;With upper extremity assist;To bed;To chair/3-in-1;Uncontrolled descent      Stand to Sit Details (indicate cue type and reason) Verbal cues for technique;Verbal cues for precautions/safety;Verbal cues for safe use of DME/AE    Stand to Sit Details Cues to slow descent into sitting    Number of Reps Other reps (comment)   5 reps throughout session     Ambulation/Gait   Ambulation/Gait Yes    Ambulation/Gait Assistance 4: Min guard    Ambulation/Gait Assistance Details Pt veers RW to left, but is able to reposition RW to avoid obstacles just before reaching them.  Cues from PT to consistently steer the RW more towards the R side allows pt to have less correcting to avoid obstacles on L     Ambulation Distance (Feet) 345 Feet   230, additional gait with obstacles   Assistive device Rolling walker    Gait Pattern Step-through pattern;Trunk flexed;Poor foot clearance - left;Poor foot clearance - right    Ambulation Surface Level;Indoor    Gait Comments Gait with obstacle negotiation:  figure-8 turn around chairs, x 2 reps, then stepping around 3 other obstacles x 3 reps.  Min assist to avoid longer obstacles with turning.  Progressed to stepping over flat black obstacle and single walking pole, x 2 reps, with PT assisting with optimal walker placement and providing min/mod assist (pt tends to start to step too far in front of the obstacle and has the walker on just the back legs after lifting it over the obstacle).                 Balance Exercises - 08/07/20 1235      Balance Exercises: Standing   Balance Beam Standing on balance beam:  forward step taps x 10, side step taps x 10 reps.  Standing on balance beam with head turns x 5, head nods x 5    Marching Foam/compliant surface;Upper extremity assist 2;Static;10 reps    Heel Raises Both;10 reps   On balance beam   Toe Raise Both;10 reps   on balance beam   Other Standing Exercises Standing on balance beam:  minisquats x 10 reps, with cues for widened BOS and upright posture between reps               PT  Short Term Goals - 07/27/20 1609      PT SHORT TERM GOAL #1   Title Pt will perform progression of  HEP independenlty for improved strength, balance and gait.  TARGET 08/24/2020    Time 4    Period Weeks    Status Revised      PT SHORT TERM GOAL #2   Title Pt will improve gait velocity to at least 2 ft/sec for improved gait efficiency  and safety.    Baseline 1.79f/s with RW 05/31/20; 1.46 ft/sec 07/03/2020; 1.79 ft/sec 07/26/2020    Time 4    Period Weeks    Status Revised    Target Date 06/28/20      PT SHORT TERM GOAL #3   Title Pt will perform sit<>stand transfers 5 reps no LOB, with UE support, mod independently for improved safety and strength wtih transfrs.    Time 4    Period Weeks             PT Long Term Goals - 07/27/20 1612      PT LONG TERM GOAL #1   Title Pt will be independent with final progression of HEP for improved strength, blaance, gait.  TARGET 09/07/2020    Time 6    Period Weeks    Status On-going      PT LONG TERM GOAL #2   Title Pt will imrpove Berg Balance score to at least 36/56 for decreased fall risk.    Baseline 30/56 05/31/20; 33/56 11/18    Time 6    Period Weeks    Status Revised      PT LONG TERM GOAL #3   Title Pt will improve TUG score to less than or equal to 30 seconds for decreased fall risk.    Baseline 37 sec 05/31/20; 31.49 sec 07/26/2020    Time 6    Period Weeks    Status On-going      PT LONG TERM GOAL #4   Title Pt will verbalize understanding of plans for continued community fitness upon d/c from PT.    Time 6    Period Weeks    Status Revised      PT LONG TERM GOAL #5   Title Pt will ascend/descent 12 steps with both handrails with supervision only, to negotiate steps at home.    Baseline not attempted 05/31/20; wife reports getting stair lift at home    Time 6    Period Weeks    Status On-going                 Plan - 08/08/20 0707    Clinical Impression Statement Focused good portion of skilled PT  session today on gait and obstacle negotiation.  Pt responds well to cues to keep the RW heading more to right side (to maintain straighter path and avoid repositioning obstacles as much on L).  With obstacle negotiation (stepping over obstacles with RW), he demonstrates decreased safety awareness and needs min/mod assist at times for safety.  He will continue to benefit from skilled PT to further address balance, gait towards improved overall functional mobility and decreased fall risk.    Personal Factors and Comorbidities Comorbidity 3+    Comorbidities PMH:  multiple myeloma, ESRD on dialysis, DM type II, DVT with IVC filter, CVA, COPD, a-fib    Examination-Activity Limitations Locomotion Level;Transfers;Stairs;Stand    Examination-Participation Restrictions Community Activity    Stability/Clinical Decision Making Evolving/Moderate complexity    Rehab Potential Good    PT Frequency 2x / week    PT Duration 6 weeks   per recert 116/06/9603  PT Treatment/Interventions ADLs/Self Care Home Management;DME Instruction;Neuromuscular re-education;Balance training;Therapeutic exercise;Therapeutic activities;Functional mobility training;Stair training;Gait training;Patient/family education    PT Next Visit Plan Continue gait, obstacle negotiation, SLS, functional BLE strengthening; standing balance; may need to review HEP and update as appropriate.  Use of machines for strengthening.    PT Home Exercise Plan Access Code [email or web address]  Consulted and Agree with Plan of Care Patient           Patient will benefit from skilled therapeutic intervention in order to improve the following deficits and impairments:  Abnormal gait, Difficulty walking, Decreased safety awareness, Decreased balance, Decreased mobility, Decreased strength  Visit Diagnosis: Other abnormalities of gait and mobility  Unsteadiness on feet     Problem List Patient Active Problem List   Diagnosis Date Noted  . Colitis 03/15/2020    . AMS (altered mental status) 10/05/2019  . Acute metabolic encephalopathy 67/20/9470  . Headache, unspecified 09/28/2019  . Abnormality of gait 09/13/2019  . Diabetes mellitus with end-stage renal disease ([place]) 08/07/2019  . Benign hypertensive kidney disease with chronic kidney disease 08/07/2019  . Hypothyroidism, unspecified 07/15/2019  . Spastic hemiparesis ([place]) 07/12/2019  . Dysphagia, oropharyngeal phase 07/05/2019  . Allergy, unspecified, initial encounter 06/27/2019  . PAF (paroxysmal atrial fibrillation) ([place])   . Bacteremia   . Labile blood pressure   . Labile blood glucose   . Controlled type 2 diabetes mellitus with hyperglycemia, without long-term current use of insulin ([place])   . ESRD on dialysis ([place])   . Right sided weakness   . SDH (subdural hematoma) ([place])   . Chronic intracranial subdural hematoma (H[facility]) 06/09/2019  . Multiple myeloma not having achieved remission ([place]) 06/09/2019  . ESRD on hemodialysis ([place]) 06/09/2019  . Essential hypertension 06/09/2019  . DM type 2 (diabetes mellitus, type 2) ([place]) 06/09/2019  . Seizure disorder ([place]) 06/09/2019  . Aspiration pneumonia ([place]) 06/09/2019  . History of bacteremia 06/09/2019  . Atrial fibrillation, chronic ([place]) 06/09/2019  . Bilateral kidney stones 05/31/2019  . Proteus infection 05/28/2019  . History of restrictive pulmonary disease 04/07/2019  . Multiple myeloma ([place]) 05/17/2018  . Goals of care, counseling/discussion 05/17/2018  . Partial idiopathic epilepsy with seizures of localized onset, intractable, without status epilepticus ([place]) 11/02/2017  . Iron deficiency anemia, unspecified 01/22/2017  . COPD (chronic obstructive pulmonary disease) ([place]) 11/11/2016  . CVA (cerebral vascular accident) ([place]) 11/11/2016  . GERD (gastroesophageal reflux disease) 11/11/2016  . GI bleed 11/11/2016  . 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  . Obesity (BMI 30-39.9) 11/11/2016  . Pulmonary hypertension  ([place]) 11/11/2016  . Patent foramen ovale 11/11/2016  . Hyperparathyroidism, secondary renal ([place]) 11/11/2016  . VTE (venous thromboembolism) 11/11/2016  . Hypotension 11/11/2016  . Systemic hypertension 11/11/2016  . Seizure ([place]) 11/11/2016  . Other seizures ([place]) 07/25/2016  . Encounter for removal of sutures 05/27/2016  . Ingrown nail 03/03/2016  . Onychomycosis due to dermatophyte 03/03/2016  . Other acquired hammer toe 03/03/2016  . Hemiparesis affecting right side as late effect of cerebrovascular accident ([place]) 02/28/2016  . Aphasia complicating stroke 96/28/3662  . Seizures ([place]) 01/08/2016  . History of ischemic left MCA stroke 01/08/2016  . Right sided weakness 01/08/2016  . Atrial fibrillation ([place]) 01/08/2016  . Leukocytosis 01/08/2016  . Benign essential HTN 01/08/2016  . Anemia of chronic disease 01/08/2016  . Controlled type 2 diabetes mellitus with diabetic nephropathy, without long-term current use of insulin ([place]) 01/08/2016  . History of DVT (deep vein thrombosis) 01/08/2016  . 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 Hypocalcemia 12/26/2015  . Long term (current) use of anticoagulants 11/07/2015  . Aftercare including intermittent dialysis ([place]) 11/02/2015  . Other specified coagulation defects ([place]) 11/02/2015  . Complication of vascular dialysis catheter 11/02/2015  . Diarrhea, unspecified 11/02/2015  . Fever, unspecified 11/02/2015  . Pruritus, unspecified 11/02/2015  . Shortness of  breath 11/02/2015  . ESRD (end stage renal disease) on dialysis [facility])     Barclay Lennox W. 08/08/2020, 2:05 PM Frazier Butt., PT  [facility] [address] [place], [place], [address] Phone: [phone]   Fax:  [phone]  Name: Isaiah [name] [facility] MRN: [id number] Date of Birth: Jan 27, 1966

## 2020-08-10 DIAGNOSIS — D631 Anemia in chronic kidney disease: Secondary | ICD-10-CM | POA: Diagnosis not present

## 2020-08-10 DIAGNOSIS — E1129 Type 2 diabetes mellitus with other diabetic kidney complication: Secondary | ICD-10-CM | POA: Diagnosis not present

## 2020-08-10 DIAGNOSIS — N2581 Secondary hyperparathyroidism of renal origin: Secondary | ICD-10-CM | POA: Diagnosis not present

## 2020-08-10 DIAGNOSIS — N186 End stage renal disease: Secondary | ICD-10-CM | POA: Diagnosis not present

## 2020-08-10 DIAGNOSIS — Z992 Dependence on renal dialysis: Secondary | ICD-10-CM | POA: Diagnosis not present

## 2020-08-11 ENCOUNTER — Other Ambulatory Visit: Payer: Self-pay | Admitting: Internal Medicine

## 2020-08-11 MED FILL — VIMPAT 200 MG TABLET: 200 | 37 days supply | Qty: 90 | Fill #0

## 2020-08-11 MED FILL — SUBVENITE 200 MG TABS: 200 | 30 days supply | Qty: 60 | Fill #10

## 2020-08-11 MED FILL — ATORVASTATIN CALCIUM 20 MG: 20 | 90 days supply | Qty: 90 | Fill #0

## 2020-08-11 MED FILL — PANTOPRAZOLE SOD DR 40 MG T: 40 | 90 days supply | Qty: 90 | Fill #0

## 2020-08-11 MED FILL — UNIFINE PENTIPS 32GX5/32: 32G X 4 MM | 80 days supply | Qty: 400 | Fill #1

## 2020-08-11 MED FILL — clonazePAM 0.5 MG TABS: 0.5 | 35 days supply | Qty: 30 | Fill #0

## 2020-08-13 ENCOUNTER — Other Ambulatory Visit: Payer: Self-pay | Admitting: Internal Medicine

## 2020-08-13 DIAGNOSIS — G8110 Spastic hemiplegia affecting unspecified side: Secondary | ICD-10-CM | POA: Diagnosis not present

## 2020-08-13 DIAGNOSIS — G40909 Epilepsy, unspecified, not intractable, without status epilepticus: Secondary | ICD-10-CM | POA: Diagnosis not present

## 2020-08-13 DIAGNOSIS — N186 End stage renal disease: Secondary | ICD-10-CM | POA: Diagnosis not present

## 2020-08-13 DIAGNOSIS — F445 Conversion disorder with seizures or convulsions: Secondary | ICD-10-CM | POA: Diagnosis not present

## 2020-08-13 DIAGNOSIS — D631 Anemia in chronic kidney disease: Secondary | ICD-10-CM | POA: Diagnosis not present

## 2020-08-13 DIAGNOSIS — R2689 Other abnormalities of gait and mobility: Secondary | ICD-10-CM | POA: Diagnosis not present

## 2020-08-13 DIAGNOSIS — N2581 Secondary hyperparathyroidism of renal origin: Secondary | ICD-10-CM | POA: Diagnosis not present

## 2020-08-13 DIAGNOSIS — Z992 Dependence on renal dialysis: Secondary | ICD-10-CM | POA: Diagnosis not present

## 2020-08-13 DIAGNOSIS — E1129 Type 2 diabetes mellitus with other diabetic kidney complication: Secondary | ICD-10-CM | POA: Diagnosis not present

## 2020-08-13 DIAGNOSIS — R269 Unspecified abnormalities of gait and mobility: Secondary | ICD-10-CM | POA: Diagnosis not present

## 2020-08-13 MED FILL — MIDODRINE HCL 10 MG TABS: 10 | 30 days supply | Qty: 60 | Fill #0

## 2020-08-14 ENCOUNTER — Telehealth: Payer: Self-pay

## 2020-08-14 ENCOUNTER — Other Ambulatory Visit: Payer: Self-pay

## 2020-08-14 ENCOUNTER — Inpatient Hospital Stay: Payer: Medicare Other

## 2020-08-14 ENCOUNTER — Inpatient Hospital Stay (HOSPITAL_BASED_OUTPATIENT_CLINIC_OR_DEPARTMENT_OTHER): Payer: Medicare Other | Admitting: Oncology

## 2020-08-14 ENCOUNTER — Telehealth: Payer: Self-pay | Admitting: *Deleted

## 2020-08-14 ENCOUNTER — Inpatient Hospital Stay: Payer: Medicare Other | Attending: Oncology

## 2020-08-14 VITALS — BP 153/81 | HR 70 | Temp 97.8°F | Resp 20 | Ht 70.0 in | Wt 189.7 lb

## 2020-08-14 DIAGNOSIS — Z5112 Encounter for antineoplastic immunotherapy: Secondary | ICD-10-CM | POA: Insufficient documentation

## 2020-08-14 DIAGNOSIS — C9000 Multiple myeloma not having achieved remission: Secondary | ICD-10-CM

## 2020-08-14 LAB — CBC WITH DIFFERENTIAL (CANCER CENTER ONLY)
Abs Immature Granulocytes: 0.01 10*3/uL (ref 0.00–0.07)
Basophils Absolute: 0.1 10*3/uL (ref 0.0–0.1)
Basophils Relative: 2 %
Eosinophils Absolute: 0.9 10*3/uL — ABNORMAL HIGH (ref 0.0–0.5)
Eosinophils Relative: 12 %
Hemoglobin: 12.5 g/dL — ABNORMAL LOW (ref 13.0–17.0)
Immature Granulocytes: 0 %
Lymphocytes Relative: 10 %
Lymphs Abs: 0.7 10*3/uL (ref 0.7–4.0)
Monocytes Absolute: 0.9 10*3/uL (ref 0.1–1.0)
Monocytes Relative: 13 %
Neutro Abs: 4.5 10*3/uL (ref 1.7–7.7)
Neutrophils Relative %: 63 %
Platelet Count: 227 10*3/uL (ref 150–400)
WBC Count: 7.1 10*3/uL (ref 4.0–10.5)

## 2020-08-14 LAB — CMP (CANCER CENTER ONLY)
ALT: 25 U/L (ref 0–44)
AST: 19 U/L (ref 15–41)
Albumin: 4.5 g/dL (ref 3.5–5.0)
Alkaline Phosphatase: 149 U/L — ABNORMAL HIGH (ref 38–126)
Anion gap: 13 (ref 5–15)
BUN: 41 mg/dL — ABNORMAL HIGH (ref 6–20)
CO2: 33 mmol/L — ABNORMAL HIGH (ref 22–32)
Calcium: 9.5 mg/dL (ref 8.9–10.3)
Chloride: 95 mmol/L — ABNORMAL LOW (ref 98–111)
Creatinine: 9.22 mg/dL (ref 0.61–1.24)
GFR, Estimated: 6 mL/min — ABNORMAL LOW (ref >60)
Glucose, Bld: 94 mg/dL (ref 70–99)
Potassium: 4.4 mmol/L (ref 3.5–5.1)
Sodium: 141 mmol/L (ref 135–145)
Total Bilirubin: 1 mg/dL (ref 0.3–1.2)
Total Protein: 7.7 g/dL (ref 6.5–8.1)

## 2020-08-14 MED ORDER — ACETAMINOPHEN 325 MG PO TABS
ORAL_TABLET | ORAL | Status: AC
  Filled 2020-08-14: qty 2

## 2020-08-14 MED ORDER — ACETAMINOPHEN 325 MG PO TABS
650.0000 mg | ORAL_TABLET | Freq: Once | ORAL | Status: AC
  Administered 2020-08-14: 650 mg via ORAL

## 2020-08-14 MED ORDER — DARATUMUMAB-HYALURONIDASE-FIHJ 1800-30000 MG-UT/15ML ~~LOC~~ SOLN
1800.0000 mg | Freq: Once | SUBCUTANEOUS | Status: AC
  Administered 2020-08-14: 1800 mg via SUBCUTANEOUS
  Filled 2020-08-14: qty 15

## 2020-08-14 MED ORDER — DIPHENHYDRAMINE HCL 25 MG PO CAPS
50.0000 mg | ORAL_CAPSULE | Freq: Once | ORAL | Status: AC
  Administered 2020-08-14: 50 mg via ORAL

## 2020-08-14 MED ORDER — DIPHENHYDRAMINE HCL 25 MG PO CAPS
ORAL_CAPSULE | ORAL | Status: AC
  Filled 2020-08-14: qty 2

## 2020-08-14 MED FILL — BREO ELLIPTA 200-25 MCG INH: 200-25 | 30 days supply | Qty: 60 | Fill #1

## 2020-08-14 NOTE — Progress Notes (Signed)
Hematology and Oncology Follow Up Visit  [place] [facility] [id number] 1965-12-31 54 y.o. 08/14/2020 12:42 PM    Principle Diagnosis: 54 year old man with IgG kappa multiple myeloma diagnosed in 2019.  Initially presented with MGUS and did not require treatment.   Past therapy: Velcade dexamethasone and cyclophosphamide weekly started on 05/25/2018.  Velcade is given at 1.5 mg/m weekly, dexamethasone is 20 mg weekly and and Cytoxan is given at 650 mg total dose.  Last treatment was given in August 2020.    Therapy was held today in anticipation of high-dose chemotherapy and autologous stem cell transplant.  He did not receive stem cell transplant due to subdural hematoma and sepsis in September 2020.  Velcade at 1.5 mg per metered squared subcutaneously weekly, dexamethasone 20 mg weekly, daratumumab 1800 mg subcutaneous injection.   Cycle 1 started on 08/30/2019.  Therapy concluded in June 2021.   Current therapy:  Daratumumab monthly maintenance started in June 2021.  He is here for the next cycle of therapy.  Interim History:  Mr. Speagle presents today for a follow-up evaluation.  Since the last visit, he reports no major changes in his health.  He has tolerated daratumumab without any complaints.  He denies any recent hospitalization or illnesses.  He denies any weakness or excessive fatigue.  His performance status and quality of life remains stable.  Continues to get stronger with physical therapy without any recent falls.  He denies any bone pain or pathological fractures.                      Medications: Unchanged on review    Current Outpatient Medications:  .  acetaminophen (TYLENOL) 325 MG tablet, Take 1-2 tablets (325-650 mg total) by mouth every 4 (four) hours as needed for mild pain., Disp:  , Rfl:  .  acyclovir (ZOVIRAX) 400 MG tablet, TAKE 1 TABLET BY MOUTH ONCE DAILY, Disp: 90 tablet, Rfl: 0 .  albuterol (PROAIR HFA) 108 (90 Base) MCG/ACT inhaler, Inhale two  puffs every 4-6 hours if needed for cough or wheeze (Patient taking differently: Inhale 2 puffs into the lungs every 4 (four) hours as needed for wheezing or shortness of breath. Inhale two puffs every 4-6 hours if needed for cough or wheeze), Disp: 6.7 g, Rfl: 2 .  Alcohol Swabs (SM ALCOHOL PREP) 70 % PADS, , Disp: , Rfl:  .  atorvastatin (LIPITOR) 20 MG tablet, TAKE 1 TABLET BY MOUTH ONCE A DAY, Disp: 30 tablet, Rfl: 0 .  AURYXIA 1 GM 210 MG(Fe) tablet, Take 210 mg by mouth 3 (three) times daily., Disp: , Rfl:  .  BREO ELLIPTA 200-25 MCG/INH AEPB, INHALE 1 PUFF INTO THE LUNGS AT THE SAME TIME EVERY DAY, Disp: 60 each, Rfl: 2 .  calcium acetate (PHOSLO) 667 MG capsule, Take by mouth., Disp: , Rfl:  .  camphor-menthol (SARNA) lotion, Apply 1 application topically every 8 (eight) hours as needed for itching., Disp: 222 mL, Rfl: 0 .  Cetirizine HCl 10 MG CAPS, Take 1 capsule by mouth daily. , Disp: , Rfl:  .  Cholecalciferol (D3-1000 PO), Take by mouth., Disp: , Rfl:  .  Cinacalcet HCl (SENSIPAR PO), Take by mouth.  (Patient not taking: Reported on 07/19/2020), Disp: , Rfl:  .  clonazePAM (KLONOPIN) 0.5 MG tablet, Take 2 tablets every Monday, Wednesday, and Friday with hemodialysis, Disp: 30 tablet, Rfl: 5 .  FREESTYLE LITE test strip, , Disp: , Rfl:  .  hydrOXYzine (ATARAX/VISTARIL) 25 MG  tablet, TAKE 1 TABLET (25 MG TOTAL) BY MOUTH EVERY 8 (EIGHT) HOURS AS NEEDED FOR ITCHING., Disp: 30 tablet, Rfl: 0 .  Insulin Glargine (BASAGLAR KWIKPEN) 100 UNIT/ML, Inject 10 Units into the skin daily. , Disp: , Rfl:  .  Insulin Lispro (HUMALOG KWIKPEN [place]), Inject 2-4 Units into the skin 3 (three) times daily before meals. Sliding scale: 150-250 =2 units, 250 -350=3 units, 350 += 4 units, Disp: , Rfl:  .  lacosamide (VIMPAT) 200 MG TABS tablet, Take 1 tablet twice a day except on dialysis days M-W-F give 1 tab in AM, 1 tab after dialysis, 1 tab in PM., Disp: 90 tablet, Rfl: 5 .  lamoTRIgine (LAMICTAL) 200 MG  tablet, Take 1 tablet (200 mg total) by mouth 2 (two) times daily., Disp: 60 tablet, Rfl: 11 .  Lancets (FREESTYLE) lancets, , Disp: , Rfl:  .  metoprolol tartrate (LOPRESSOR) 25 MG tablet, Take 25 mg by mouth daily., Disp: , Rfl:  .  midodrine (PROAMATINE) 10 MG tablet, TAKE 1 TABLET BY MOUTH TWICE DAILY WITH MEALS, Disp: 60 tablet, Rfl: 0 .  multivitamin (RENA-VIT) TABS tablet, Take 1 tablet by mouth at bedtime., Disp: , Rfl:  .  Nutritional Supplements (FEEDING SUPPLEMENT, NEPRO CARB STEADY,) LIQD, Take 237 mLs by mouth 3 (three) times daily as needed (Supplement)., Disp: 237 mL, Rfl: 12 .  pantoprazole (PROTONIX) 40 MG tablet, TAKE 1 TABLET BY MOUTH ONCE DAILY, Disp: 90 tablet, Rfl: 1 .  prochlorperazine (COMPAZINE) 10 MG tablet, TAKE 1 TABLET (10 MG TOTAL) BY MOUTH EVERY 6 (SIX) HOURS AS NEEDED FOR NAUSEA OR VOMITING., Disp: 30 tablet, Rfl: 0 .  UNIFINE PENTIPS 31G X 8 MM MISC, USE AS DIRECTED DAILY AFTER SUPPER, Disp: 100 each, Rfl: 3  Allergies:  Allergies  Allergen Reactions  . Codeine Swelling  . Codeine Rash and Other (See Comments)    Unknown reaction (patient says it was more serious than just a rash, but he can't remember what happened)       Physical Exam:          Blood pressure (!) 153/81, pulse 70, temperature 97.8 F (36.6 C), temperature source Tympanic, resp. rate 20, height _0  (1.778 m), weight 189 lb 11.2 oz (86 kg), SpO2 100 %.       ECOG: 1    General appearance: Comfortable appearing without any discomfort Head: Normocephalic without any trauma Oropharynx: Mucous membranes are moist and pink without any thrush or ulcers. Eyes: Pupils are equal and round reactive to light. Lymph nodes: No cervical, supraclavicular, inguinal or axillary lymphadenopathy.   Heart:regular rate and rhythm.  S1 and S2 without leg edema. Lung: Clear without any rhonchi or wheezes.  No dullness to percussion. Abdomin: Soft, nontender, nondistended with good bowel  sounds.  No hepatosplenomegaly. Musculoskeletal: No joint deformity or effusion.  Full range of motion noted. Neurological: No deficits noted on motor, sensory and deep tendon reflex exam. Skin: No petechial rash or dryness.  Appeared moist.                     Lab Results: Lab Results  Component Value Date   WBC 9.0 07/19/2020   HGB 11.4 (A) 07/19/2020   HCT 33 (A) 07/19/2020   MCV RESULTS UNAVAILABLE DUE TO INTERFERING SUBSTANCE 07/17/2020   PLT 248 07/19/2020     Chemistry      Component Value Date/Time   NA 141 07/19/2020 0000   NA 138 07/03/2017 0813  K 4.4 07/19/2020 0000   K 4.2 07/03/2017 0813   CL 98 (A) 07/19/2020 0000   CO2 34 (H) 07/17/2020 1006   CO2 27 07/03/2017 0813   BUN 64 (A) 07/19/2020 0000   BUN 46.4 (H) 07/03/2017 0813   CREATININE 10.7 (A) 07/19/2020 0000   CREATININE 8.43 (HH) 07/17/2020 1006   CREATININE 10.9 (HH) 07/03/2017 0813   GLU 222 07/19/2020 0000      Component Value Date/Time   CALCIUM 10.0 07/19/2020 0000   CALCIUM 9.3 07/03/2017 0813   ALKPHOS 115 07/17/2020 1006   ALKPHOS 77 07/03/2017 0813   AST 15 07/17/2020 1006   AST 16 07/03/2017 0813   ALT 12 07/17/2020 1006   ALT 14 07/03/2017 0813   BILITOT 1.4 (H) 07/17/2020 1006   BILITOT 0.93 07/03/2017 0813       Impression and Plan:  54 year old man with:  1.  IgG kappa multiple myeloma diagnosed in 2019.  [name]  He is currently on daratumumab maintenance without any major complications.  Protein studies obtained on July 17, 2020 showed no evidence of M spike indicating complete response.  He still has a slight rise in the kappa to lambda ratio on serum light chains.  Risks and benefits of continuing daratumumab versus discontinuation were discussed at this time.  We have opted to temporarily discontinue daratumumab and reevaluate in 3 months.  Starting salvage therapy will be indicated if he developed relapsed disease.  That would be in the form of possibly  Kyprolis with daratumumab potentially.  2. Anemia: Multifactorial in nature related to his chronic renal insufficiency, plasma cell disorder GI bleeding.  His hemoglobin is close to normal currently at 12.5.   3.  Antiemetics: No nausea or vomiting reported at this time.  Compazine is available to him.  4.  Renal failure: He is hemodialysis dependent without any recent issues at this time.  5. Goals of care:  Aggressive therapy is warranted given his reasonable performance status and treatable disease.   6. Follow-up: In 3 months for a repeat follow-up.   30 minutes were spent on this encounter.  Time was dedicated to reviewing laboratory data, disease status update, treatment options and future plan of care review.  Zola Button, MD 12/7/202112:42 PM

## 2020-08-14 NOTE — Telephone Encounter (Signed)
The pt's wife was notified that Dr Baird Cancer said yes it's fine for the pt to attend his son's graduation and for them to follow the covid safety protocols.

## 2020-08-14 NOTE — Progress Notes (Signed)
NO Dexamethasone today per Dr. Alen Blew

## 2020-08-14 NOTE — Telephone Encounter (Signed)
Received critical lab result from [facility] lab of 9.22.  Pt seen today by Dr Alen Blew.  Notified of result.

## 2020-08-14 NOTE — Patient Instructions (Signed)
[facility] Discharge Instructions for Patients Receiving Chemotherapy  Today you received the following chemotherapy agent: Daratumumab (Darzalex FASPRO)  To help prevent nausea and vomiting after your treatment, we encourage you to take your nausea medication as directed by your MD   If you develop nausea and vomiting that is not controlled by your nausea medication, call the clinic.   BELOW ARE SYMPTOMS THAT SHOULD BE REPORTED IMMEDIATELY:  *FEVER GREATER THAN 100.5 F  *CHILLS WITH OR WITHOUT FEVER  NAUSEA AND VOMITING THAT IS NOT CONTROLLED WITH YOUR NAUSEA MEDICATION  *UNUSUAL SHORTNESS OF BREATH  *UNUSUAL BRUISING OR BLEEDING  TENDERNESS IN MOUTH AND THROAT WITH OR WITHOUT PRESENCE OF ULCERS  *URINARY PROBLEMS  *BOWEL PROBLEMS  UNUSUAL RASH Items with * indicate a potential emergency and should be followed up as soon as possible.  Feel free to call the clinic should you have any questions or concerns. The clinic phone number is [phone].  Please show the [place] at check-in to the Emergency Department and triage nurse.

## 2020-08-15 ENCOUNTER — Other Ambulatory Visit: Payer: Self-pay | Admitting: Oncology

## 2020-08-15 DIAGNOSIS — E1129 Type 2 diabetes mellitus with other diabetic kidney complication: Secondary | ICD-10-CM | POA: Diagnosis not present

## 2020-08-15 DIAGNOSIS — D631 Anemia in chronic kidney disease: Secondary | ICD-10-CM | POA: Diagnosis not present

## 2020-08-15 DIAGNOSIS — N2581 Secondary hyperparathyroidism of renal origin: Secondary | ICD-10-CM | POA: Diagnosis not present

## 2020-08-15 DIAGNOSIS — Z992 Dependence on renal dialysis: Secondary | ICD-10-CM | POA: Diagnosis not present

## 2020-08-15 DIAGNOSIS — N186 End stage renal disease: Secondary | ICD-10-CM | POA: Diagnosis not present

## 2020-08-15 LAB — MULTIPLE MYELOMA PANEL, SERUM
Albumin SerPl Elph-Mcnc: 4.3 g/dL (ref 2.9–4.4)
Albumin/Glob SerPl: 1.7 (ref 0.7–1.7)
Alpha 1: 0.3 g/dL (ref 0.0–0.4)
Alpha2 Glob SerPl Elph-Mcnc: 0.9 g/dL (ref 0.4–1.0)
B-Globulin SerPl Elph-Mcnc: 0.9 g/dL (ref 0.7–1.3)
Gamma Glob SerPl Elph-Mcnc: 0.6 g/dL (ref 0.4–1.8)
Globulin, Total: 2.6 g/dL (ref 2.2–3.9)
IgA: 39 mg/dL — ABNORMAL LOW (ref 90–386)
IgG (Immunoglobin G), Serum: 592 mg/dL — ABNORMAL LOW (ref 603–1613)
IgM (Immunoglobulin M), Srm: 30 mg/dL (ref 20–172)
Total Protein ELP: 6.9 g/dL (ref 6.0–8.5)

## 2020-08-15 LAB — KAPPA/LAMBDA LIGHT CHAINS
Kappa free light chain: 105 mg/L — ABNORMAL HIGH (ref 3.3–19.4)
Kappa, lambda light chain ratio: 2.16 — ABNORMAL HIGH (ref 0.26–1.65)
Lambda free light chains: 48.5 mg/L — ABNORMAL HIGH (ref 5.7–26.3)

## 2020-08-15 LAB — CBC: RBC: 11.7 — AB (ref 3.87–5.11)

## 2020-08-15 LAB — BASIC METABOLIC PANEL
BUN: 65 — AB (ref 4–21)
CO2: 30 — AB (ref 13–22)
Creatinine: 10.7 — AB (ref 0.6–1.3)
Glucose: 162

## 2020-08-15 LAB — IRON,TIBC AND FERRITIN PANEL: TIBC: 132

## 2020-08-15 LAB — CBC AND DIFFERENTIAL
Hemoglobin: 35.1 — AB (ref 13.5–17.5)
WBC: 3.4

## 2020-08-15 MED FILL — ACYCLOVIR 400 MG TABLET: 400 | 90 days supply | Qty: 90 | Fill #0

## 2020-08-16 ENCOUNTER — Ambulatory Visit: Payer: Medicare Other | Admitting: Occupational Therapy

## 2020-08-16 ENCOUNTER — Ambulatory Visit: Payer: Medicare Other | Attending: Physical Medicine & Rehabilitation | Admitting: Physical Therapy

## 2020-08-16 ENCOUNTER — Other Ambulatory Visit: Payer: Self-pay

## 2020-08-16 ENCOUNTER — Encounter: Payer: Self-pay | Admitting: Physical Therapy

## 2020-08-16 DIAGNOSIS — R41840 Attention and concentration deficit: Secondary | ICD-10-CM

## 2020-08-16 DIAGNOSIS — R2689 Other abnormalities of gait and mobility: Secondary | ICD-10-CM | POA: Diagnosis not present

## 2020-08-16 DIAGNOSIS — R41842 Visuospatial deficit: Secondary | ICD-10-CM | POA: Diagnosis not present

## 2020-08-16 DIAGNOSIS — M6281 Muscle weakness (generalized): Secondary | ICD-10-CM | POA: Diagnosis not present

## 2020-08-16 DIAGNOSIS — R278 Other lack of coordination: Secondary | ICD-10-CM | POA: Insufficient documentation

## 2020-08-16 DIAGNOSIS — R2681 Unsteadiness on feet: Secondary | ICD-10-CM | POA: Insufficient documentation

## 2020-08-16 DIAGNOSIS — R41844 Frontal lobe and executive function deficit: Secondary | ICD-10-CM | POA: Insufficient documentation

## 2020-08-16 NOTE — Therapy (Signed)
[facility] [address] [place], [place], [address] Phone: [phone]   Fax:  [phone]  Physical Therapy Treatment  Patient Details  Name: Isaiah [name] MRN: [id number] Date of Birth: November 30, 1965 Referring Provider (PT): Delice Lesch, MD   Encounter Date: 08/16/2020   PT End of Session - 08/16/20 1456    Visit Number 24    Number of Visits 34   per recert 56/38/7564   Date for PT Re-Evaluation 09/24/20    Authorization Type Medicare; KX starting at 07/27/2020 visit    Progress Note Due on Visit 30    PT Start Time 1449    PT Stop Time 1531    PT Time Calculation (min) 42 min    Equipment Utilized During Treatment Gait belt    Activity Tolerance Patient tolerated treatment well    Behavior During Therapy [name] for tasks assessed/performed           Past Medical History:  Diagnosis Date  . A-fib ([place])   . Anemia   . Asthma   . DM type 2 (diabetes mellitus, type 2) ([place]) 06/09/2019  . ESRD (end stage renal disease) on dialysis ([place])   . Essential hypertension 06/09/2019  . GIB (gastrointestinal bleeding)    Recurrent episodes- 09/2014, 09/2015 and 05/2016  . Gout   . History of recent blood transfusion 10/27/14   2 Units PRBC's  . Hyperkalemia 07/2011  . Multiple myeloma ([place])   . OSA on CPAP   . Pulmonary embolism ([place]) 07/2011; 09/27/2014   a. Bilat PE 07/2011 - unclear cause, tx with 6 months Coumadin.;   . Seizure disorder ([place]) 06/09/2019  . Sepsis ([place])   . Sickle cell-thalassemia disease ([place])    a. Sickle cell trait.  . Sleep apnea   . Stroke ([place]) 09/2015   R-MCA, L-MCA, PCA and bilateral cerebellar complicated by DVT/PE  . Subdural hematoma ([place]) 05/2019    Past Surgical History:  Procedure Laterality Date  . AV FISTULA PLACEMENT Right 12/05/2015   Procedure: INSERTION OF ARTERIOVENOUS (AV) GORE-TEX GRAFT ARM;  Surgeon: Elam Dutch, MD;  Location: [facility];  Service: Vascular;  Laterality:  Right;  . BONE MARROW BIOPSY    . CATARACT EXTRACTION  08/2019  . CATARACT EXTRACTION  09/2019  . CHOLECYSTECTOMY    . CHOLECYSTECTOMY  1990's?  . COLONOSCOPY WITH PROPOFOL N/A 01/22/2017   Procedure: COLONOSCOPY WITH PROPOFOL;  Surgeon: Wilford Corner, MD;  Location: [facility];  Service: Endoscopy;  Laterality: N/A;  . EYE SURGERY Right   . INSERTION OF DIALYSIS CATHETER Right 10/16/2015   Procedure: INSERTION OF PALINDROME DIALYSIS CATHETER ;  Surgeon: Elam Dutch, MD;  Location: [facility];  Service: Vascular;  Laterality: Right;  . OTHER SURGICAL HISTORY     Retinal surgery  . PARS PLANA VITRECTOMY  02/17/2012   Procedure: PARS PLANA VITRECTOMY WITH 25 GAUGE;  Surgeon: Hayden Pedro, MD;  Location: [facility];  Service: Ophthalmology;  Laterality: Right;  . PERIPHERAL VASCULAR CATHETERIZATION N/A 03/20/2016   Procedure: A/V Shuntogram/Fistulagram;  Surgeon: Conrad [place], MD;  Location: [facility];  Service: Cardiovascular;  Laterality: N/A;  . TEE WITHOUT CARDIOVERSION N/A 12/02/2016   Procedure: TRANSESOPHAGEAL ECHOCARDIOGRAM (TEE);  Surgeon: Larey Dresser, MD;  Location: [facility];  Service: Cardiovascular;  Laterality: N/A;    There were no vitals filed for this visit.   Subjective Assessment - 08/16/20 1455    Subjective No changes, no complaints or  falls. Had an infusion on Tuesday and was wiped out.    Pertinent History multiple myeloma, ESRD on dialysis, DM type II, DVT with IVC filter, CVA, COPD, a-fib; chemo treatments down to 1x/month (next is August 17th)    Patient Stated Goals To get back to walk without the walker, back to his baseline    Currently in Pain? No/denies    Pain Score 0-No pain                  [facility] Adult PT Treatment/Exercise - 08/16/20 1458      Transfers   Transfers Sit to Stand;Stand to Sit    Sit to Stand 5: Supervision;With upper extremity assist;From bed;From chair/3-in-1    Stand to Sit 4: Min guard;With upper extremity  assist;To bed;To chair/3-in-1;Uncontrolled descent      Ambulation/Gait   Ambulation/Gait Yes    Ambulation/Gait Assistance 4: Min guard    Ambulation/Gait Assistance Details around the gym with session incorporating obstacles and tight places/spaces with cues to avoid objects and technique in tight spaces    Assistive device Rolling walker    Gait Pattern Step-through pattern;Trunk flexed;Poor foot clearance - left;Poor foot clearance - right    Ambulation Surface Level;Indoor      Knee/Hip Exercises: Aerobic   Nustep UE/LE's on level 5 for 8 minute with goal 40-45 steps per minute for strengthening and activity tolerance.      Knee/Hip Exercises: Standing   Forward Step Up Both;1 set;10 reps;Hand Hold: 2;Step Height: 6";Limitations    Forward Step Up Limitations cues on form and technique needed    Other Standing Knee Exercises standing at bottom of steps with bil UE support: foot taps up/down bottom 2 steps for 10 reps each leg with cues for technique, to lift foot at hip and not slide it off step edge. cues for posture as well.               Balance Exercises - 08/16/20 1523      Balance Exercises: Standing   SLS with Vectors Solid surface;Upper extremity assist 1;Other reps (comment);Limitations    SLS with Vectors Limitations foam bubbles on floor with HHA for alternating forward light foot taps for 10 reps each foot with cues for posture, stance position and weight shifting.               PT Short Term Goals - 07/27/20 1609      PT SHORT TERM GOAL #1   Title Pt will perform progression of  HEP independenlty for improved strength, balance and gait.  TARGET 08/24/2020    Time 4    Period Weeks    Status Revised      PT SHORT TERM GOAL #2   Title Pt will improve gait velocity to at least 2 ft/sec for improved gait efficiency and safety.    Baseline 1.69f/s with RW 05/31/20; 1.46 ft/sec 07/03/2020; 1.79 ft/sec 07/26/2020    Time 4    Period Weeks    Status  Revised    Target Date 06/28/20      PT SHORT TERM GOAL #3   Title Pt will perform sit<>stand transfers 5 reps no LOB, with UE support, mod independently for improved safety and strength wtih transfrs.    Time 4    Period Weeks             PT Long Term Goals - 07/27/20 1612      PT LONG TERM GOAL #1   Title  Pt will be independent with final progression of HEP for improved strength, blaance, gait.  TARGET 09/07/2020    Time 6    Period Weeks    Status On-going      PT LONG TERM GOAL #2   Title Pt will imrpove Berg Balance score to at least 36/56 for decreased fall risk.    Baseline 30/56 05/31/20; 33/56 11/18    Time 6    Period Weeks    Status Revised      PT LONG TERM GOAL #3   Title Pt will improve TUG score to less than or equal to 30 seconds for decreased fall risk.    Baseline 37 sec 05/31/20; 31.49 sec 07/26/2020    Time 6    Period Weeks    Status On-going      PT LONG TERM GOAL #4   Title Pt will verbalize understanding of plans for continued community fitness upon d/c from PT.    Time 6    Period Weeks    Status Revised      PT LONG TERM GOAL #5   Title Pt will ascend/descent 12 steps with both handrails with supervision only, to negotiate steps at home.    Baseline not attempted 05/31/20; wife reports getting stair lift at home    Time 6    Period Weeks    Status On-going                 Plan - 08/16/20 1458    Clinical Impression Statement Today's skilled session continued to focus on strengthening and balance with no issues noted or reported. The pt is progressing and should benefit from continued PT to progress toward unmet goals.    Personal Factors and Comorbidities Comorbidity 3+    Comorbidities PMH:  multiple myeloma, ESRD on dialysis, DM type II, DVT with IVC filter, CVA, COPD, a-fib    Examination-Activity Limitations Locomotion Level;Transfers;Stairs;Stand    Examination-Participation Restrictions Community Activity     Stability/Clinical Decision Making Evolving/Moderate complexity    Rehab Potential Good    PT Frequency 2x / week    PT Duration 6 weeks   per recert 96/78/9381   PT Treatment/Interventions ADLs/Self Care Home Management;DME Instruction;Neuromuscular re-education;Balance training;Therapeutic exercise;Therapeutic activities;Functional mobility training;Stair training;Gait training;Patient/family education    PT Next Visit Plan Continue gait, obstacle negotiation, SLS, functional BLE strengthening; standing balance; may need to review HEP and update as appropriate.  Use of machines for strengthening.    PT Home Exercise Plan Access Code [email or web address]    Consulted and Agree with Plan of Care Patient           Patient will benefit from skilled therapeutic intervention in order to improve the following deficits and impairments:  Abnormal gait,Difficulty walking,Decreased safety awareness,Decreased balance,Decreased mobility,Decreased strength  Visit Diagnosis: Muscle weakness (generalized)  Unsteadiness on feet  Other abnormalities of gait and mobility     Problem List Patient Active Problem List   Diagnosis Date Noted  . Colitis 03/15/2020  . AMS (altered mental status) 10/05/2019  . Acute metabolic encephalopathy 85/27/7824  . Headache, unspecified 09/28/2019  . Abnormality of gait 09/13/2019  . Diabetes mellitus with end-stage renal disease ([place]) 08/07/2019  . Benign hypertensive kidney disease with chronic kidney disease 08/07/2019  . Hypothyroidism, unspecified 07/15/2019  . Spastic hemiparesis ([place]) 07/12/2019  . Dysphagia, oropharyngeal phase 07/05/2019  . Allergy, unspecified, initial encounter 06/27/2019  . PAF (paroxysmal atrial fibrillation) ([place])   . Bacteremia   . Labile blood  pressure   . Labile blood glucose   . Controlled type 2 diabetes mellitus with hyperglycemia, without long-term current use of insulin ([place])   . ESRD on dialysis ([place])   . Right sided weakness    . SDH (subdural hematoma) ([place])   . Chronic intracranial subdural hematoma ([facility]) 06/09/2019  . Multiple myeloma not having achieved remission ([place]) 06/09/2019  . ESRD on hemodialysis ([place]) 06/09/2019  . Essential hypertension 06/09/2019  . DM type 2 (diabetes mellitus, type 2) ([place]) 06/09/2019  . Seizure disorder ([place]) 06/09/2019  . Aspiration pneumonia ([place]) 06/09/2019  . History of bacteremia 06/09/2019  . Atrial fibrillation, chronic ([place]) 06/09/2019  . Bilateral kidney stones 05/31/2019  . Proteus infection 05/28/2019  . History of restrictive pulmonary disease 04/07/2019  . Multiple myeloma ([place]) 05/17/2018  . Goals of care, counseling/discussion 05/17/2018  . Partial idiopathic epilepsy with seizures of localized onset, intractable, without status epilepticus ([place]) 11/02/2017  . Iron deficiency anemia, unspecified 01/22/2017  . COPD (chronic obstructive pulmonary disease) ([place]) 11/11/2016  . CVA (cerebral vascular accident) ([place]) 11/11/2016  . GERD (gastroesophageal reflux disease) 11/11/2016  . GI bleed 11/11/2016  . 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  . Obesity (BMI 30-39.9) 11/11/2016  . Pulmonary hypertension ([place]) 11/11/2016  . Patent foramen ovale 11/11/2016  . Hyperparathyroidism, secondary renal ([place]) 11/11/2016  . VTE (venous thromboembolism) 11/11/2016  . Hypotension 11/11/2016  . Systemic hypertension 11/11/2016  . Seizure ([place]) 11/11/2016  . Other seizures ([place]) 07/25/2016  . Encounter for removal of sutures 05/27/2016  . Ingrown nail 03/03/2016  . Onychomycosis due to dermatophyte 03/03/2016  . Other acquired hammer toe 03/03/2016  . Hemiparesis affecting right side as late effect of cerebrovascular accident ([place]) 02/28/2016  . Aphasia complicating stroke 96/29/5284  . Seizures ([place]) 01/08/2016  . History of ischemic left MCA stroke 01/08/2016  . Right sided weakness 01/08/2016  . Atrial fibrillation ([place]) 01/08/2016  . Leukocytosis  01/08/2016  . Benign essential HTN 01/08/2016  . Anemia of chronic disease 01/08/2016  . Controlled type 2 diabetes mellitus with diabetic nephropathy, without long-term current use of insulin ([place]) 01/08/2016  . History of DVT (deep vein thrombosis) 01/08/2016  . 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 Hypocalcemia 12/26/2015  . Long term (current) use of anticoagulants 11/07/2015  . Aftercare including intermittent dialysis ([place]) 11/02/2015  . Other specified coagulation defects ([place]) 11/02/2015  . Complication of vascular dialysis catheter 11/02/2015  . Diarrhea, unspecified 11/02/2015  . Fever, unspecified 11/02/2015  . Pruritus, unspecified 11/02/2015  . Shortness of breath 11/02/2015  . ESRD (end stage renal disease) on dialysis [facility])     Isaiah [name] 08/16/2020, 4:08 PM  [facility] [address] [place], [place], [address] Phone: [phone]   Fax:  [phone]  Name: Isaiah [name] MRN: [id number] Date of Birth: 1966-04-19

## 2020-08-16 NOTE — Therapy (Signed)
[facility] [address] [place], [place], [address] Phone: [phone]   Fax:  [phone]  Occupational Therapy Treatment  Patient Details  Name: Isaiah [name] MRN: [id number] Date of Birth: 20-Mar-1966 Referring Provider (OT): Isaiah [name].   Encounter Date: 08/16/2020   OT End of Session - 08/16/20 1549    Visit Number 12    Number of Visits 20    Date for OT Re-Evaluation 09/04/20    Authorization Type Medicare    Authorization Time Period WEEK 1/4    Authorization - Visit Number 12    Progress Note Due on Visit 20    OT Start Time 1400    OT Stop Time 1445    OT Time Calculation (min) 45 min    Activity Tolerance Patient tolerated treatment well    Behavior During Therapy WFL for tasks assessed/performed           Past Medical History:  Diagnosis Date  . [name]-fib ([place])   . Anemia   . Asthma   . DM type 2 (diabetes mellitus, type 2) ([place]) 06/09/2019  . ESRD (end stage renal disease) on dialysis ([place])   . Essential hypertension 06/09/2019  . GIB (gastrointestinal bleeding)    Recurrent episodes- 09/2014, 09/2015 and 05/2016  . Gout   . History of recent blood transfusion 10/27/14   2 Units PRBC's  . Hyperkalemia 07/2011  . Multiple myeloma ([place])   . OSA on CPAP   . Pulmonary embolism ([place]) 07/2011; 09/27/2014   [name]. Bilat PE 07/2011 - unclear cause, tx with 6 months Coumadin.;   . Seizure disorder ([place]) 06/09/2019  . Sepsis ([place])   . Sickle cell-thalassemia disease ([place])    [name]. Sickle cell trait.  . Sleep apnea   . Stroke ([place]) 09/2015   R-MCA, L-MCA, PCA and bilateral cerebellar complicated by DVT/PE  . Subdural hematoma ([place]) 05/2019    Past Surgical History:  Procedure Laterality Date  . AV FISTULA PLACEMENT Right 12/05/2015   Procedure: INSERTION OF ARTERIOVENOUS (AV) GORE-TEX GRAFT ARM;  Surgeon: Elam Dutch, MD;  Location: [facility];  Service: Vascular;  Laterality: Right;  . BONE MARROW  BIOPSY    . CATARACT EXTRACTION  08/2019  . CATARACT EXTRACTION  09/2019  . CHOLECYSTECTOMY    . CHOLECYSTECTOMY  1990's?  . COLONOSCOPY WITH PROPOFOL N/[name] 01/22/2017   Procedure: COLONOSCOPY WITH PROPOFOL;  Surgeon: Wilford Corner, MD;  Location: [facility];  Service: Endoscopy;  Laterality: N/[name];  . EYE SURGERY Right   . INSERTION OF DIALYSIS CATHETER Right 10/16/2015   Procedure: INSERTION OF PALINDROME DIALYSIS CATHETER ;  Surgeon: Elam Dutch, MD;  Location: [facility];  Service: Vascular;  Laterality: Right;  . OTHER SURGICAL HISTORY     Retinal surgery  . PARS PLANA VITRECTOMY  02/17/2012   Procedure: PARS PLANA VITRECTOMY WITH 25 GAUGE;  Surgeon: Hayden Pedro, MD;  Location: [facility];  Service: Ophthalmology;  Laterality: Right;  . PERIPHERAL VASCULAR CATHETERIZATION N/[name] 03/20/2016   Procedure: [name]/V Shuntogram/Fistulagram;  Surgeon: Conrad [place], MD;  Location: [facility];  Service: Cardiovascular;  Laterality: N/[name];  . TEE WITHOUT CARDIOVERSION N/[name] 12/02/2016   Procedure: TRANSESOPHAGEAL ECHOCARDIOGRAM (TEE);  Surgeon: Larey Dresser, MD;  Location: [facility];  Service: Cardiovascular;  Laterality: N/[name];    There were no vitals filed for this visit.   Subjective Assessment - 08/16/20 1400    Subjective  Pt reports cataract surgery both eyes  and laser sx Rt eye prior to SDH    Patient is accompanied by: Family member   spouse   Pertinent History multiple myeloma (currently undergoing chemo treatment), DM, CVA, DVT, seizure disorder, SDH, ESRD on dialysis, and history of SDH x 2    Limitations Fall. No Driving. RUE fistula-dialysis; hx of seizures    Patient Stated Goals write better with his right hand, pick things up better with R hand    Currently in Pain? No/denies                        OT Treatments/Exercises (OP) - 08/16/20 0001      ADLs   Writing Pt attempted to write name w/ various style pens Rt hand, but unable d/t tone and inability to  control pen, and severe micrographia noted. Pre-writing ex's to trace shapes/letters w/ max difficulty Rt hand. Pt also appears to have visual deficits, but more accurate w/ tracing if allowed to use Lt hand.    ADL Comments Pt bumping into wall and counter on Lt side when walking into O.T. session.          Practiced safety w/ ambulation and walker negotiation as pt tends to go towards Lt side - pt given cues to follow visual targets (darker tiles at [name] of floor) as guide for [name] of hallways. Pt almost bumped into person seated on Lt side and missed when hallway split to Lt side.           OT Short Term Goals - 08/07/20 1420      OT SHORT TERM GOAL #1   Title Patient and caregiver will be independent with HEP 07/12/20    Time 4    Period Weeks    Status Achieved    Target Date 07/12/20      OT SHORT TERM GOAL #2   Title Patient will perform UB dressing with min [name] in order to increase independence with ADLs    Time 4    Period Weeks    Status Achieved      OT SHORT TERM GOAL #3   Title Patient will demonstrate improved function use of RUE as evidenced by increasing Box and Blocks score to 12 blocks or more.    Baseline RUE 9    Time 4    Period Weeks    Status Achieved   RUE 12     OT SHORT TERM GOAL #4   Title Pt will improve RUE grip strength by at least 3 lbs or more in order to manage clothing with more independence with toileting    Baseline 22.4    Time 4    Period Weeks    Status Achieved   25.7     OT SHORT TERM GOAL #5   Title Pt will perform all aspects of toileting with min [name] consistently    Time 4    Period Weeks    Status On-going   unable to get an accurate answer as family not present for session this day 11/18     OT SHORT TERM GOAL #6   Title Pt and spouse will report patient utilizing RUE for functional tasks 15% of the time in daily activities.    Time 4    Period Weeks    Status Achieved             OT Long Term Goals - 08/07/20  1420      OT  LONG TERM GOAL #1   Title Pt will be independent with updated HEP 08/09/2020    Time 8    Period Weeks    Status New      OT LONG TERM GOAL #2   Title Pt will improve RUE shoulder flexion to 110 degrees in order to obtain lightweight object from overhead cabinet.    Time 8    Period Weeks    Status New      OT LONG TERM GOAL #3   Title Patient will demonstrate improved function use of RUE as evidenced by increasing Box and Blocks score to 17 blocks or more.    Time 8    Period Weeks    Status On-going   12     OT LONG TERM GOAL #4   Title Patient will perform UB dressing with supervision with verbal cues in order to increase indepedence with ADLs.    Time 8    Period Weeks    Status Achieved      OT LONG TERM GOAL #5   Title Pt will improve RUE grip strength by at least 6 lbs in order to manage clothing with more independence with toileting    Baseline 22.4 RUE    Time 8    Period Weeks    Status On-going   25.4     OT LONG TERM GOAL #6   Title Pt will perform all aspects of toileting with distant supervision for safety in order to increase independence with ADLs.    Time 8    Period Weeks    Status On-going      OT LONG TERM GOAL #7   Title Pt and spouse will report patient utilizing RUE for functional tasks 25% of the time in daily activities.    Time 8    Period Weeks    Status On-going                 Plan - 08/16/20 1550    Clinical Impression Statement Pt has significant tone Rt hand which impedes control of pen for writing. Pt also limited by visual deficits - pt appears to have Lt inattention and/or field cut, however presentation may be [name] result of midline shift vs. [name] true field cut.    OT Occupational Profile and History Detailed Assessment- Review of Records and additional review of physical, cognitive, psychosocial history related to current functional performance    Occupational performance deficits (Please refer to evaluation for  details): IADL's;ADL's;Leisure;Social Participation    Body Structure / Function / Physical Skills ADL;Decreased knowledge of use of DME;Gait;Strength;Tone;GMC;Dexterity;Balance;Body mechanics;Proprioception;UE functional use;ROM;IADL;Coordination;FMC;Vision;Flexibility;Mobility    Cognitive Skills Attention;Problem Solve;Safety Awareness;Sequencing;Energy/Drive;Thought;Learn;Memory;Understand;Perception    Rehab Potential Good    Clinical Decision Making Limited treatment options, no task modification necessary    Comorbidities Affecting Occupational Performance: May have comorbidities impacting occupational performance    Modification or Assistance to Complete Evaluation  No modification of tasks or assist necessary to complete eval    OT Frequency 2x / week    OT Duration 8 weeks    OT Treatment/Interventions Self-care/ADL training;Moist Heat;Fluidtherapy;DME and/or AE instruction;Balance training;Gait Training;Therapeutic activities;Cognitive remediation/compensation;Therapeutic exercise;Ultrasound;Neuromuscular education;Passive range of motion;Visual/perceptual remediation/compensation;Functional Mobility Training;Electrical Stimulation;Energy conservation;Manual Therapy;Patient/family education    Plan continue RUE functional use, coordination, safety with ambulation and visual scanning    Recommended Other Services Recommended ST referral - spouse asked to wait.    Consulted and Agree with Plan of Care Patient;Family member/caregiver    Family Member Consulted  mom           Patient will benefit from skilled therapeutic intervention in order to improve the following deficits and impairments:   Body Structure / Function / Physical Skills: ADL,Decreased knowledge of use of DME,Gait,Strength,Tone,GMC,Dexterity,Balance,Body mechanics,Proprioception,UE functional use,ROM,IADL,Coordination,FMC,Vision,Flexibility,Mobility Cognitive Skills: Attention,Problem Solve,Safety  Awareness,Sequencing,Energy/Drive,Thought,Learn,Memory,Understand,Perception     Visit Diagnosis: Other lack of coordination  Attention and concentration deficit  Visuospatial deficit    Problem List Patient Active Problem List   Diagnosis Date Noted  . Colitis 03/15/2020  . AMS (altered mental status) 10/05/2019  . Acute metabolic encephalopathy 01/60/1093  . Headache, unspecified 09/28/2019  . Abnormality of gait 09/13/2019  . Diabetes mellitus with end-stage renal disease ([place]) 08/07/2019  . Benign hypertensive kidney disease with chronic kidney disease 08/07/2019  . Hypothyroidism, unspecified 07/15/2019  . Spastic hemiparesis ([place]) 07/12/2019  . Dysphagia, oropharyngeal phase 07/05/2019  . Allergy, unspecified, initial encounter 06/27/2019  . PAF (paroxysmal atrial fibrillation) ([place])   . Bacteremia   . Labile blood pressure   . Labile blood glucose   . Controlled type 2 diabetes mellitus with hyperglycemia, without long-term current use of insulin ([place])   . ESRD on dialysis ([place])   . Right sided weakness   . SDH (subdural hematoma) ([place])   . Chronic intracranial subdural hematoma ([facility]) 06/09/2019  . Multiple myeloma not having achieved remission ([place]) 06/09/2019  . ESRD on hemodialysis ([place]) 06/09/2019  . Essential hypertension 06/09/2019  . DM type 2 (diabetes mellitus, type 2) ([place]) 06/09/2019  . Seizure disorder ([place]) 06/09/2019  . Aspiration pneumonia ([place]) 06/09/2019  . History of bacteremia 06/09/2019  . Atrial fibrillation, chronic ([place]) 06/09/2019  . Bilateral kidney stones 05/31/2019  . Proteus infection 05/28/2019  . History of restrictive pulmonary disease 04/07/2019  . Multiple myeloma ([place]) 05/17/2018  . Goals of care, counseling/discussion 05/17/2018  . Partial idiopathic epilepsy with seizures of localized onset, intractable, without status epilepticus ([place]) 11/02/2017  . Iron deficiency anemia, unspecified 01/22/2017  . COPD (chronic  obstructive pulmonary disease) ([place]) 11/11/2016  . CVA (cerebral vascular accident) ([place]) 11/11/2016  . GERD (gastroesophageal reflux disease) 11/11/2016  . GI bleed 11/11/2016  . 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  . Obesity (BMI 30-39.9) 11/11/2016  . Pulmonary hypertension ([place]) 11/11/2016  . Patent foramen ovale 11/11/2016  . Hyperparathyroidism, secondary renal ([place]) 11/11/2016  . VTE (venous thromboembolism) 11/11/2016  . Hypotension 11/11/2016  . Systemic hypertension 11/11/2016  . Seizure ([place]) 11/11/2016  . Other seizures ([place]) 07/25/2016  . Encounter for removal of sutures 05/27/2016  . Ingrown nail 03/03/2016  . Onychomycosis due to dermatophyte 03/03/2016  . Other acquired hammer toe 03/03/2016  . Hemiparesis affecting right side as late effect of cerebrovascular accident ([place]) 02/28/2016  . Aphasia complicating stroke 23/55/7322  . Seizures ([place]) 01/08/2016  . History of ischemic left MCA stroke 01/08/2016  . Right sided weakness 01/08/2016  . Atrial fibrillation ([place]) 01/08/2016  . Leukocytosis 01/08/2016  . Benign essential HTN 01/08/2016  . Anemia of chronic disease 01/08/2016  . Controlled type 2 diabetes mellitus with diabetic nephropathy, without long-term current use of insulin ([place]) 01/08/2016  . History of DVT (deep vein thrombosis) 01/08/2016  . 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 Hypocalcemia 12/26/2015  . Long term (current) use of anticoagulants 11/07/2015  . Aftercare including intermittent dialysis ([place]) 11/02/2015  . Other specified coagulation defects ([place]) 11/02/2015  . Complication of vascular dialysis catheter 11/02/2015  . Diarrhea, unspecified 11/02/2015  . Fever, unspecified 11/02/2015  . Pruritus, unspecified  11/02/2015  . Shortness of breath 11/02/2015  . ESRD (end stage renal disease) on dialysis [name])     Carey Bullocks, OTR/L 08/16/2020, 3:52 PM  [facility] [address] [place], [place], [address] Phone: [phone]   Fax:  [phone]  Name: Isaiah [name] MRN: [id number] Date of Birth: December 03, 1965

## 2020-08-17 DIAGNOSIS — D631 Anemia in chronic kidney disease: Secondary | ICD-10-CM | POA: Diagnosis not present

## 2020-08-17 DIAGNOSIS — N186 End stage renal disease: Secondary | ICD-10-CM | POA: Diagnosis not present

## 2020-08-17 DIAGNOSIS — E1129 Type 2 diabetes mellitus with other diabetic kidney complication: Secondary | ICD-10-CM | POA: Diagnosis not present

## 2020-08-17 DIAGNOSIS — N2581 Secondary hyperparathyroidism of renal origin: Secondary | ICD-10-CM | POA: Diagnosis not present

## 2020-08-17 DIAGNOSIS — Z992 Dependence on renal dialysis: Secondary | ICD-10-CM | POA: Diagnosis not present

## 2020-08-18 MED ORDER — BASAGLAR KWIKPEN 100 UNIT/ML ~~LOC~~ SOPN: 8.0000 [IU] | PEN_INJECTOR | Freq: Every day | SUBCUTANEOUS | 3 refills | Status: DC

## 2020-08-20 DIAGNOSIS — N186 End stage renal disease: Secondary | ICD-10-CM | POA: Diagnosis not present

## 2020-08-20 DIAGNOSIS — Z992 Dependence on renal dialysis: Secondary | ICD-10-CM | POA: Diagnosis not present

## 2020-08-20 DIAGNOSIS — D631 Anemia in chronic kidney disease: Secondary | ICD-10-CM | POA: Diagnosis not present

## 2020-08-20 DIAGNOSIS — N2581 Secondary hyperparathyroidism of renal origin: Secondary | ICD-10-CM | POA: Diagnosis not present

## 2020-08-20 DIAGNOSIS — E1129 Type 2 diabetes mellitus with other diabetic kidney complication: Secondary | ICD-10-CM | POA: Diagnosis not present

## 2020-08-20 MED FILL — CALCIUM ACETATE 667 MG CAP: 667 | 30 days supply | Qty: 390 | Fill #2

## 2020-08-21 ENCOUNTER — Ambulatory Visit: Payer: Medicare Other | Admitting: Physical Therapy

## 2020-08-21 ENCOUNTER — Other Ambulatory Visit: Payer: Self-pay

## 2020-08-21 ENCOUNTER — Ambulatory Visit: Payer: Medicare Other | Admitting: Occupational Therapy

## 2020-08-21 ENCOUNTER — Encounter: Payer: Self-pay | Admitting: Occupational Therapy

## 2020-08-21 DIAGNOSIS — R2689 Other abnormalities of gait and mobility: Secondary | ICD-10-CM | POA: Diagnosis not present

## 2020-08-21 DIAGNOSIS — R41842 Visuospatial deficit: Secondary | ICD-10-CM | POA: Diagnosis not present

## 2020-08-21 DIAGNOSIS — R278 Other lack of coordination: Secondary | ICD-10-CM | POA: Diagnosis not present

## 2020-08-21 DIAGNOSIS — R41844 Frontal lobe and executive function deficit: Secondary | ICD-10-CM

## 2020-08-21 DIAGNOSIS — R41840 Attention and concentration deficit: Secondary | ICD-10-CM | POA: Diagnosis not present

## 2020-08-21 DIAGNOSIS — R2681 Unsteadiness on feet: Secondary | ICD-10-CM | POA: Diagnosis not present

## 2020-08-21 DIAGNOSIS — M6281 Muscle weakness (generalized): Secondary | ICD-10-CM | POA: Diagnosis not present

## 2020-08-21 NOTE — Therapy (Signed)
[facility] [address] [place], [place], [address] Phone: [phone]   Fax:  [phone]  Physical Therapy Treatment  Patient Details  Name: Isaiah [name] [facility] MRN: [id number] Date of Birth: June 11, 1966 Referring Provider (PT): Delice Lesch, MD   Encounter Date: 08/21/2020   PT End of Session - 08/21/20 1725    Visit Number 25    Number of Visits 34   per recert 46/50/3546   Date for PT Re-Evaluation 09/24/20    Authorization Type Medicare; KX starting at 07/27/2020 visit    Progress Note Due on Visit 30    PT Start Time 1415    PT Stop Time 1500    PT Time Calculation (min) 45 min    Equipment Utilized During Treatment Gait belt    Activity Tolerance Patient tolerated treatment well    Behavior During Therapy [facility] for tasks assessed/performed           Past Medical History:  Diagnosis Date  . A-fib ([place])   . Anemia   . Asthma   . DM type 2 (diabetes mellitus, type 2) ([place]) 06/09/2019  . ESRD (end stage renal disease) on dialysis ([place])   . Essential hypertension 06/09/2019  . GIB (gastrointestinal bleeding)    Recurrent episodes- 09/2014, 09/2015 and 05/2016  . Gout   . History of recent blood transfusion 10/27/14   2 Units PRBC's  . Hyperkalemia 07/2011  . Multiple myeloma ([place])   . OSA on CPAP   . Pulmonary embolism ([place]) 07/2011; 09/27/2014   a. Bilat PE 07/2011 - unclear cause, tx with 6 months Coumadin.;   . Seizure disorder ([place]) 06/09/2019  . Sepsis ([place])   . Sickle cell-thalassemia disease ([place])    a. Sickle cell trait.  . Sleep apnea   . Stroke ([place]) 09/2015   R-MCA, L-MCA, PCA and bilateral cerebellar complicated by DVT/PE  . Subdural hematoma ([place]) 05/2019    Past Surgical History:  Procedure Laterality Date  . AV FISTULA PLACEMENT Right 12/05/2015   Procedure: INSERTION OF ARTERIOVENOUS (AV) GORE-TEX GRAFT ARM;  Surgeon: Elam Dutch, MD;  Location: [facility];  Service: Vascular;  Laterality:  Right;  . BONE MARROW BIOPSY    . CATARACT EXTRACTION  08/2019  . CATARACT EXTRACTION  09/2019  . CHOLECYSTECTOMY    . CHOLECYSTECTOMY  1990's?  . COLONOSCOPY WITH PROPOFOL N/A 01/22/2017   Procedure: COLONOSCOPY WITH PROPOFOL;  Surgeon: Wilford Corner, MD;  Location: [facility];  Service: Endoscopy;  Laterality: N/A;  . EYE SURGERY Right   . INSERTION OF DIALYSIS CATHETER Right 10/16/2015   Procedure: INSERTION OF PALINDROME DIALYSIS CATHETER ;  Surgeon: Elam Dutch, MD;  Location: [facility];  Service: Vascular;  Laterality: Right;  . OTHER SURGICAL HISTORY     Retinal surgery  . PARS PLANA VITRECTOMY  02/17/2012   Procedure: PARS PLANA VITRECTOMY WITH 25 GAUGE;  Surgeon: Hayden Pedro, MD;  Location: [facility];  Service: Ophthalmology;  Laterality: Right;  . PERIPHERAL VASCULAR CATHETERIZATION N/A 03/20/2016   Procedure: A/V Shuntogram/Fistulagram;  Surgeon: Conrad Hinckley, MD;  Location: [facility];  Service: Cardiovascular;  Laterality: N/A;  . TEE WITHOUT CARDIOVERSION N/A 12/02/2016   Procedure: TRANSESOPHAGEAL ECHOCARDIOGRAM (TEE);  Surgeon: Larey Dresser, MD;  Location: [facility];  Service: Cardiovascular;  Laterality: N/A;    There were no vitals filed for this visit.   Subjective Assessment - 08/21/20 1712    Subjective No changes, no falls.  Pertinent History multiple myeloma, ESRD on dialysis, DM type II, DVT with IVC filter, CVA, COPD, a-fib; chemo treatments down to 1x/month (next is August 17th)    Patient Stated Goals To get back to walk without the walker, back to his baseline    Currently in Pain? No/denies                             [name] Adult PT Treatment/Exercise - 08/21/20 1421      Transfers   Transfers Sit to Stand;Stand to Sit    Sit to Stand 4: Min guard;With upper extremity assist;From bed    Sit to Stand Details Verbal cues for technique;Verbal cues for precautions/safety;Verbal cues for safe use of DME/AE    Sit to Stand  Details (indicate cue type and reason) Cues for hand placement    Stand to Sit 4: Min guard;With upper extremity assist;To bed;To chair/3-in-1;Uncontrolled descent    Number of Reps 10 reps;2 sets    Comments Standing on Airex, 2nd set from mat surface, with cues for increased forward lean, foot placement to avoid posterior lean and push on mat table with posterior aspect of thighs.      Ambulation/Gait   Ambulation/Gait Yes    Ambulation/Gait Assistance 4: Min guard    Ambulation Distance (Feet) 100 Feet   x 2, 115 ft   Assistive device Rolling walker    Gait Pattern Step-through pattern;Trunk flexed;Poor foot clearance - left;Poor foot clearance - right    Ambulation Surface Level;Indoor      Knee/Hip Exercises: Aerobic   Nustep 4 extremities x 8 minutes, Level 4>5 for improved flexibility and strength.  Cues to keep steps/min >55-60      Knee/Hip Exercises: Standing   Lateral Step Up Right;Left;1 set;10 reps;Hand Hold: 2;Step Height: 6"    Lateral Step Up Limitations PT provides min assist initially for staibility    Forward Step Up Both;10 reps;Hand Hold: 2;Step Height: 6";Limitations;1 set          Cues with gait for veering more to R to avoid obstacles on L side.  He is slow to correct, but does not bump any obstacles on L side.     Balance Exercises - 08/21/20 1437      Balance Exercises: Standing   SLS Eyes open;Solid surface;Upper extremity support 1;3 reps;10 secs    SLS with Vectors Solid surface;Upper extremity assist 1;Other reps (comment);Limitations;Upper extremity assist 2    SLS with Vectors Limitations Alt step taps to 6" step, cues for foot clearance to tap step and to remove foot torwards floor, 2 sets x 10 reps.  In parallel bars, BUE support rolling weighted ball x 10 reps each leg, then rolling pool noodle 10 reps each leg.  Cues with RLE as SLS for terminal knee extension/upright posture.    Retro Gait Upper extremity support;2 reps;Limitations    Retro Gait  Limitations Forward/back walk in parallel bars, cues for widened BOS and step length    Other Standing Exercises Monster walk diagonal step together forward/back in parallel bars x 2 reps with cues for technique (pt has difficulty with sequencing throughout)               PT Short Term Goals - 07/27/20 1609      PT SHORT TERM GOAL #1   Title Pt will perform progression of  HEP independenlty for improved strength, balance and gait.  TARGET 08/24/2020  Time 4    Period Weeks    Status Revised      PT SHORT TERM GOAL #2   Title Pt will improve gait velocity to at least 2 ft/sec for improved gait efficiency and safety.    Baseline 1.108f/s with RW 05/31/20; 1.46 ft/sec 07/03/2020; 1.79 ft/sec 07/26/2020    Time 4    Period Weeks    Status Revised    Target Date 06/28/20      PT SHORT TERM GOAL #3   Title Pt will perform sit<>stand transfers 5 reps no LOB, with UE support, mod independently for improved safety and strength wtih transfrs.    Time 4    Period Weeks             PT Long Term Goals - 07/27/20 1612      PT LONG TERM GOAL #1   Title Pt will be independent with final progression of HEP for improved strength, blaance, gait.  TARGET 09/07/2020    Time 6    Period Weeks    Status On-going      PT LONG TERM GOAL #2   Title Pt will imrpove Berg Balance score to at least 36/56 for decreased fall risk.    Baseline 30/56 05/31/20; 33/56 11/18    Time 6    Period Weeks    Status Revised      PT LONG TERM GOAL #3   Title Pt will improve TUG score to less than or equal to 30 seconds for decreased fall risk.    Baseline 37 sec 05/31/20; 31.49 sec 07/26/2020    Time 6    Period Weeks    Status On-going      PT LONG TERM GOAL #4   Title Pt will verbalize understanding of plans for continued community fitness upon d/c from PT.    Time 6    Period Weeks    Status Revised      PT LONG TERM GOAL #5   Title Pt will ascend/descent 12 steps with both handrails with  supervision only, to negotiate steps at home.    Baseline not attempted 05/31/20; wife reports getting stair lift at home    Time 6    Period Weeks    Status On-going                 Plan - 08/21/20 1725    Clinical Impression Statement Focused on strength, balance, and gait trianing.  With some single limb stance activities, he is able to transition to single UE support with min guard assist from therapist.  With repeated sit<>stand, he needs cues for foot placement and forward lean for optimal positioning to avoid pushing posteriorly through thighs on mat surface.  He will continue to benefit from skilled PT towards goals for improved overall functional mobility.    Personal Factors and Comorbidities Comorbidity 3+    Comorbidities PMH:  multiple myeloma, ESRD on dialysis, DM type II, DVT with IVC filter, CVA, COPD, a-fib    Examination-Activity Limitations Locomotion Level;Transfers;Stairs;Stand    Examination-Participation Restrictions Community Activity    Stability/Clinical Decision Making Evolving/Moderate complexity    Rehab Potential Good    PT Frequency 2x / week    PT Duration 6 weeks   per recert 171/02/2693  PT Treatment/Interventions ADLs/Self Care Home Management;DME Instruction;Neuromuscular re-education;Balance training;Therapeutic exercise;Therapeutic activities;Functional mobility training;Stair training;Gait training;Patient/family education    PT Next Visit Plan Check STGs; update HEP as needed.  Anticipate planning for d/c  at end of scheduled visits in several weeks.    PT Home Exercise Plan Access Code [email or web address]    Consulted and Agree with Plan of Care Patient           Patient will benefit from skilled therapeutic intervention in order to improve the following deficits and impairments:  Abnormal gait,Difficulty walking,Decreased safety awareness,Decreased balance,Decreased mobility,Decreased strength  Visit Diagnosis: Unsteadiness on feet  Muscle weakness  (generalized)  Other abnormalities of gait and mobility     Problem List Patient Active Problem List   Diagnosis Date Noted  . Colitis 03/15/2020  . AMS (altered mental status) 10/05/2019  . Acute metabolic encephalopathy 10/93/2355  . Headache, unspecified 09/28/2019  . Abnormality of gait 09/13/2019  . Diabetes mellitus with end-stage renal disease ([place]) 08/07/2019  . Benign hypertensive kidney disease with chronic kidney disease 08/07/2019  . Hypothyroidism, unspecified 07/15/2019  . Spastic hemiparesis ([place]) 07/12/2019  . Dysphagia, oropharyngeal phase 07/05/2019  . Allergy, unspecified, initial encounter 06/27/2019  . PAF (paroxysmal atrial fibrillation) ([place])   . Bacteremia   . Labile blood pressure   . Labile blood glucose   . Controlled type 2 diabetes mellitus with hyperglycemia, without long-term current use of insulin ([place])   . ESRD on dialysis ([place])   . Right sided weakness   . SDH (subdural hematoma) ([place])   . Chronic intracranial subdural hematoma ([facility]) 06/09/2019  . Multiple myeloma not having achieved remission ([place]) 06/09/2019  . ESRD on hemodialysis ([place]) 06/09/2019  . Essential hypertension 06/09/2019  . DM type 2 (diabetes mellitus, type 2) ([place]) 06/09/2019  . Seizure disorder ([place]) 06/09/2019  . Aspiration pneumonia ([place]) 06/09/2019  . History of bacteremia 06/09/2019  . Atrial fibrillation, chronic ([place]) 06/09/2019  . Bilateral kidney stones 05/31/2019  . Proteus infection 05/28/2019  . History of restrictive pulmonary disease 04/07/2019  . Multiple myeloma ([place]) 05/17/2018  . Goals of care, counseling/discussion 05/17/2018  . Partial idiopathic epilepsy with seizures of localized onset, intractable, without status epilepticus ([place]) 11/02/2017  . Iron deficiency anemia, unspecified 01/22/2017  . COPD (chronic obstructive pulmonary disease) ([place]) 11/11/2016  . CVA (cerebral vascular accident) ([place]) 11/11/2016  . GERD (gastroesophageal reflux  disease) 11/11/2016  . GI bleed 11/11/2016  . 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  . Obesity (BMI 30-39.9) 11/11/2016  . Pulmonary hypertension ([place]) 11/11/2016  . Patent foramen ovale 11/11/2016  . Hyperparathyroidism, secondary renal ([place]) 11/11/2016  . VTE (venous thromboembolism) 11/11/2016  . Hypotension 11/11/2016  . Systemic hypertension 11/11/2016  . Seizure ([place]) 11/11/2016  . Other seizures ([place]) 07/25/2016  . Encounter for removal of sutures 05/27/2016  . Ingrown nail 03/03/2016  . Onychomycosis due to dermatophyte 03/03/2016  . Other acquired hammer toe 03/03/2016  . Hemiparesis affecting right side as late effect of cerebrovascular accident ([place]) 02/28/2016  . Aphasia complicating stroke 73/22/0254  . Seizures ([place]) 01/08/2016  . History of ischemic left MCA stroke 01/08/2016  . Right sided weakness 01/08/2016  . Atrial fibrillation ([place]) 01/08/2016  . Leukocytosis 01/08/2016  . Benign essential HTN 01/08/2016  . Anemia of chronic disease 01/08/2016  . Controlled type 2 diabetes mellitus with diabetic nephropathy, without long-term current use of insulin ([place]) 01/08/2016  . History of DVT (deep vein thrombosis) 01/08/2016  . 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 Hypocalcemia 12/26/2015  . Long term (current) use of anticoagulants 11/07/2015  . Aftercare including intermittent dialysis ([place]) 11/02/2015  . Other specified coagulation defects ([place]) 11/02/2015  . Complication of vascular dialysis catheter 11/02/2015  .  Diarrhea, unspecified 11/02/2015  . Fever, unspecified 11/02/2015  . Pruritus, unspecified 11/02/2015  . Shortness of breath 11/02/2015  . ESRD (end stage renal disease) on dialysis ([place])     , W. 08/21/2020, 5:29 PM  Mady Haagensen, PT 08/21/20 5:30 PM Phone: [phone] Fax: [facility] [address] [place], [place], [address] Phone:  [phone]   Fax:  [phone]  Name: Isaiah [name] [facility] MRN: [id number] Date of Birth: 16-Jan-1966

## 2020-08-21 NOTE — Therapy (Signed)
[facility] [address] [place], [place], [address] Phone: [phone]   Fax:  [phone]  Occupational Therapy Treatment  Patient Details  Name: Isaiah [name] [facility] MRN: [id number] Date of Birth: September 29, 1965 Referring Provider (OT): Posey Pronto, Ankit A.   Encounter Date: 08/21/2020   OT End of Session - 08/21/20 1322    Visit Number 13    Number of Visits 20    Date for OT Re-Evaluation 09/04/20    Authorization Type Medicare    Authorization Time Period WEEK 2/4    Authorization - Visit Number 13    Progress Note Due on Visit 20    OT Start Time 1319    OT Stop Time 1400    OT Time Calculation (min) 41 min    Activity Tolerance Patient tolerated treatment well    Behavior During Therapy WFL for tasks assessed/performed           Past Medical History:  Diagnosis Date  . A-fib ([place])   . Anemia   . Asthma   . DM type 2 (diabetes mellitus, type 2) ([place]) 06/09/2019  . ESRD (end stage renal disease) on dialysis ([place])   . Essential hypertension 06/09/2019  . GIB (gastrointestinal bleeding)    Recurrent episodes- 09/2014, 09/2015 and 05/2016  . Gout   . History of recent blood transfusion 10/27/14   2 Units PRBC's  . Hyperkalemia 07/2011  . Multiple myeloma ([place])   . OSA on CPAP   . Pulmonary embolism ([place]) 07/2011; 09/27/2014   a. Bilat PE 07/2011 - unclear cause, tx with 6 months Coumadin.;   . Seizure disorder ([place]) 06/09/2019  . Sepsis ([place])   . Sickle cell-thalassemia disease ([place])    a. Sickle cell trait.  . Sleep apnea   . Stroke ([place]) 09/2015   R-MCA, L-MCA, PCA and bilateral cerebellar complicated by DVT/PE  . Subdural hematoma ([place]) 05/2019    Past Surgical History:  Procedure Laterality Date  . AV FISTULA PLACEMENT Right 12/05/2015   Procedure: INSERTION OF ARTERIOVENOUS (AV) GORE-TEX GRAFT ARM;  Surgeon: Elam Dutch, MD;  Location: [facility];  Service: Vascular;  Laterality: Right;  . BONE MARROW  BIOPSY    . CATARACT EXTRACTION  08/2019  . CATARACT EXTRACTION  09/2019  . CHOLECYSTECTOMY    . CHOLECYSTECTOMY  1990's?  . COLONOSCOPY WITH PROPOFOL N/A 01/22/2017   Procedure: COLONOSCOPY WITH PROPOFOL;  Surgeon: Wilford Corner, MD;  Location: [facility];  Service: Endoscopy;  Laterality: N/A;  . EYE SURGERY Right   . INSERTION OF DIALYSIS CATHETER Right 10/16/2015   Procedure: INSERTION OF PALINDROME DIALYSIS CATHETER ;  Surgeon: Elam Dutch, MD;  Location: [facility];  Service: Vascular;  Laterality: Right;  . OTHER SURGICAL HISTORY     Retinal surgery  . PARS PLANA VITRECTOMY  02/17/2012   Procedure: PARS PLANA VITRECTOMY WITH 25 GAUGE;  Surgeon: Hayden Pedro, MD;  Location: [facility];  Service: Ophthalmology;  Laterality: Right;  . PERIPHERAL VASCULAR CATHETERIZATION N/A 03/20/2016   Procedure: A/V Shuntogram/Fistulagram;  Surgeon: Conrad [place], MD;  Location: [facility];  Service: Cardiovascular;  Laterality: N/A;  . TEE WITHOUT CARDIOVERSION N/A 12/02/2016   Procedure: TRANSESOPHAGEAL ECHOCARDIOGRAM (TEE);  Surgeon: Larey Dresser, MD;  Location: [facility];  Service: Cardiovascular;  Laterality: N/A;    There were no vitals filed for this visit.   Subjective Assessment - 08/21/20 1322    Subjective  Pt denies any changes and reports  no falls or pain.    Patient is accompanied by: Family member   spouse   Pertinent History multiple myeloma (currently undergoing chemo treatment), DM, CVA, DVT, seizure disorder, SDH, ESRD on dialysis, and history of SDH x 2    Limitations Fall. No Driving. RUE fistula-dialysis; hx of seizures    Patient Stated Goals write better with his right hand, pick things up better with R hand    Currently in Pain? No/denies                        OT Treatments/Exercises (OP) - 08/21/20 1327      Visual/Perceptual Exercises   Scanning Tabletop    Scanning - Tabletop digital/analog times - with increased time and max visual and  verbal cues for scanning entire board, identifying and recalling time looking for. downgraded to limit options to 10. Pt with increased ease and ability to complete task with less options. Min A required for attending to time on each card. Pt inconsistent with what he reports seeing making it difficult to assess. Pt sometimes would have max difficulty with identifying correct time on analog clocks    Other Exercises visual table top scanning with playing cards with fliping cards and stacking with RUE with min drops and some difficulty with coordination and with min cues for visual scanning to the left. Activity required increased time                    OT Short Term Goals - 08/07/20 1420      OT SHORT TERM GOAL #1   Title Patient and caregiver will be independent with HEP 07/12/20    Time 4    Period Weeks    Status Achieved    Target Date 07/12/20      OT SHORT TERM GOAL #2   Title Patient will perform UB dressing with min A in order to increase independence with ADLs    Time 4    Period Weeks    Status Achieved      OT SHORT TERM GOAL #3   Title Patient will demonstrate improved function use of RUE as evidenced by increasing Box and Blocks score to 12 blocks or more.    Baseline RUE 9    Time 4    Period Weeks    Status Achieved   RUE 12     OT SHORT TERM GOAL #4   Title Pt will improve RUE grip strength by at least 3 lbs or more in order to manage clothing with more independence with toileting    Baseline 22.4    Time 4    Period Weeks    Status Achieved   25.7     OT SHORT TERM GOAL #5   Title Pt will perform all aspects of toileting with min A consistently    Time 4    Period Weeks    Status On-going   unable to get an accurate answer as family not present for session this day 11/18     OT SHORT TERM GOAL #6   Title Pt and spouse will report patient utilizing RUE for functional tasks 15% of the time in daily activities.    Time 4    Period Weeks    Status  Achieved             OT Long Term Goals - 08/07/20 1420      OT LONG TERM GOAL #  1   Title Pt will be independent with updated HEP 08/09/2020    Time 8    Period Weeks    Status New      OT LONG TERM GOAL #2   Title Pt will improve RUE shoulder flexion to 110 degrees in order to obtain lightweight object from overhead cabinet.    Time 8    Period Weeks    Status New      OT LONG TERM GOAL #3   Title Patient will demonstrate improved function use of RUE as evidenced by increasing Box and Blocks score to 17 blocks or more.    Time 8    Period Weeks    Status On-going   12     OT LONG TERM GOAL #4   Title Patient will perform UB dressing with supervision with verbal cues in order to increase indepedence with ADLs.    Time 8    Period Weeks    Status Achieved      OT LONG TERM GOAL #5   Title Pt will improve RUE grip strength by at least 6 lbs in order to manage clothing with more independence with toileting    Baseline 22.4 RUE    Time 8    Period Weeks    Status On-going   25.4     OT LONG TERM GOAL #6   Title Pt will perform all aspects of toileting with distant supervision for safety in order to increase independence with ADLs.    Time 8    Period Weeks    Status On-going      OT LONG TERM GOAL #7   Title Pt and spouse will report patient utilizing RUE for functional tasks 25% of the time in daily activities.    Time 8    Period Weeks    Status On-going                 Plan - 08/21/20 1347    Clinical Impression Statement Pt inconsistent with reports of visual deficits and performance making it dificulty to fully assess. Continues to present with poor RUE coordination and cognitive and visual deficits impeding independence with ADLs and IADLs    OT Occupational Profile and History Detailed Assessment- Review of Records and additional review of physical, cognitive, psychosocial history related to current functional performance    Occupational performance  deficits (Please refer to evaluation for details): IADL's;ADL's;Leisure;Social Participation    Body Structure / Function / Physical Skills ADL;Decreased knowledge of use of DME;Gait;Strength;Tone;GMC;Dexterity;Balance;Body mechanics;Proprioception;UE functional use;ROM;IADL;Coordination;FMC;Vision;Flexibility;Mobility    Cognitive Skills Attention;Problem Solve;Safety Awareness;Sequencing;Energy/Drive;Thought;Learn;Memory;Understand;Perception    Rehab Potential Good    Clinical Decision Making Limited treatment options, no task modification necessary    Comorbidities Affecting Occupational Performance: May have comorbidities impacting occupational performance    Modification or Assistance to Complete Evaluation  No modification of tasks or assist necessary to complete eval    OT Frequency 2x / week    OT Duration 8 weeks    OT Treatment/Interventions Self-care/ADL training;Moist Heat;Fluidtherapy;DME and/or AE instruction;Balance training;Gait Training;Therapeutic activities;Cognitive remediation/compensation;Therapeutic exercise;Ultrasound;Neuromuscular education;Passive range of motion;Visual/perceptual remediation/compensation;Functional Mobility Training;Electrical Stimulation;Energy conservation;Manual Therapy;Patient/family education    Plan continue RUE functional use, coordination, safety with ambulation and visual scanning    Recommended Other Services Recommended ST referral - spouse asked to wait.    Consulted and Agree with Plan of Care Patient;Family member/caregiver    Family Member Consulted mom           Patient will  benefit from skilled therapeutic intervention in order to improve the following deficits and impairments:   Body Structure / Function / Physical Skills: ADL,Decreased knowledge of use of DME,Gait,Strength,Tone,GMC,Dexterity,Balance,Body mechanics,Proprioception,UE functional use,ROM,IADL,Coordination,FMC,Vision,Flexibility,Mobility Cognitive Skills:  Attention,Problem Solve,Safety Awareness,Sequencing,Energy/Drive,Thought,Learn,Memory,Understand,Perception     Visit Diagnosis: Other lack of coordination  Attention and concentration deficit  Visuospatial deficit  Muscle weakness (generalized)  Frontal lobe and executive function deficit    Problem List Patient Active Problem List   Diagnosis Date Noted  . Colitis 03/15/2020  . AMS (altered mental status) 10/05/2019  . Acute metabolic encephalopathy 56/31/4970  . Headache, unspecified 09/28/2019  . Abnormality of gait 09/13/2019  . Diabetes mellitus with end-stage renal disease ([place]) 08/07/2019  . Benign hypertensive kidney disease with chronic kidney disease 08/07/2019  . Hypothyroidism, unspecified 07/15/2019  . Spastic hemiparesis ([place]) 07/12/2019  . Dysphagia, oropharyngeal phase 07/05/2019  . Allergy, unspecified, initial encounter 06/27/2019  . PAF (paroxysmal atrial fibrillation) ([place])   . Bacteremia   . Labile blood pressure   . Labile blood glucose   . Controlled type 2 diabetes mellitus with hyperglycemia, without long-term current use of insulin ([place])   . ESRD on dialysis ([place])   . Right sided weakness   . SDH (subdural hematoma) ([place])   . Chronic intracranial subdural hematoma ([facility]) 06/09/2019  . Multiple myeloma not having achieved remission ([place]) 06/09/2019  . ESRD on hemodialysis ([place]) 06/09/2019  . Essential hypertension 06/09/2019  . DM type 2 (diabetes mellitus, type 2) ([place]) 06/09/2019  . Seizure disorder ([place]) 06/09/2019  . Aspiration pneumonia ([place]) 06/09/2019  . History of bacteremia 06/09/2019  . Atrial fibrillation, chronic ([place]) 06/09/2019  . Bilateral kidney stones 05/31/2019  . Proteus infection 05/28/2019  . History of restrictive pulmonary disease 04/07/2019  . Multiple myeloma ([place]) 05/17/2018  . Goals of care, counseling/discussion 05/17/2018  . Partial idiopathic epilepsy with seizures of localized onset, intractable, without  status epilepticus ([place]) 11/02/2017  . Iron deficiency anemia, unspecified 01/22/2017  . COPD (chronic obstructive pulmonary disease) ([place]) 11/11/2016  . CVA (cerebral vascular accident) ([place]) 11/11/2016  . GERD (gastroesophageal reflux disease) 11/11/2016  . GI bleed 11/11/2016  . 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  . Obesity (BMI 30-39.9) 11/11/2016  . Pulmonary hypertension ([place]) 11/11/2016  . Patent foramen ovale 11/11/2016  . Hyperparathyroidism, secondary renal ([place]) 11/11/2016  . VTE (venous thromboembolism) 11/11/2016  . Hypotension 11/11/2016  . Systemic hypertension 11/11/2016  . Seizure ([place]) 11/11/2016  . Other seizures ([place]) 07/25/2016  . Encounter for removal of sutures 05/27/2016  . Ingrown nail 03/03/2016  . Onychomycosis due to dermatophyte 03/03/2016  . Other acquired hammer toe 03/03/2016  . Hemiparesis affecting right side as late effect of cerebrovascular accident ([place]) 02/28/2016  . Aphasia complicating stroke 26/37/8588  . Seizures ([place]) 01/08/2016  . History of ischemic left MCA stroke 01/08/2016  . Right sided weakness 01/08/2016  . Atrial fibrillation ([place]) 01/08/2016  . Leukocytosis 01/08/2016  . Benign essential HTN 01/08/2016  . Anemia of chronic disease 01/08/2016  . Controlled type 2 diabetes mellitus with diabetic nephropathy, without long-term current use of insulin ([place]) 01/08/2016  . History of DVT (deep vein thrombosis) 01/08/2016  . 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 Hypocalcemia 12/26/2015  . Long term (current) use of anticoagulants 11/07/2015  . Aftercare including intermittent dialysis ([place]) 11/02/2015  . Other specified coagulation defects ([place]) 11/02/2015  . Complication of vascular dialysis catheter 11/02/2015  . Diarrhea, unspecified 11/02/2015  . Fever, unspecified 11/02/2015  . Pruritus, unspecified 11/02/2015  .  Shortness of breath 11/02/2015  . ESRD (end stage renal disease) on dialysis [name])     Zachery Conch MOT, OTR/L  08/21/2020, 1:57 PM  [facility] [address] [place], [place], [address] Phone: [phone]   Fax:  [phone]  Name: Lemar Bakos [facility] MRN: [id number] Date of Birth: 12-14-1965

## 2020-08-22 DIAGNOSIS — D631 Anemia in chronic kidney disease: Secondary | ICD-10-CM | POA: Diagnosis not present

## 2020-08-22 DIAGNOSIS — N2581 Secondary hyperparathyroidism of renal origin: Secondary | ICD-10-CM | POA: Diagnosis not present

## 2020-08-22 DIAGNOSIS — N186 End stage renal disease: Secondary | ICD-10-CM | POA: Diagnosis not present

## 2020-08-22 DIAGNOSIS — E1129 Type 2 diabetes mellitus with other diabetic kidney complication: Secondary | ICD-10-CM | POA: Diagnosis not present

## 2020-08-22 DIAGNOSIS — Z992 Dependence on renal dialysis: Secondary | ICD-10-CM | POA: Diagnosis not present

## 2020-08-23 ENCOUNTER — Encounter: Payer: Self-pay | Admitting: Occupational Therapy

## 2020-08-23 ENCOUNTER — Ambulatory Visit: Payer: Medicare Other | Admitting: Physical Therapy

## 2020-08-23 ENCOUNTER — Other Ambulatory Visit: Payer: Self-pay

## 2020-08-23 ENCOUNTER — Encounter: Payer: Self-pay | Admitting: Physical Therapy

## 2020-08-23 ENCOUNTER — Ambulatory Visit: Payer: Medicare Other | Admitting: Occupational Therapy

## 2020-08-23 DIAGNOSIS — M6281 Muscle weakness (generalized): Secondary | ICD-10-CM | POA: Diagnosis not present

## 2020-08-23 DIAGNOSIS — R2681 Unsteadiness on feet: Secondary | ICD-10-CM | POA: Diagnosis not present

## 2020-08-23 DIAGNOSIS — R41844 Frontal lobe and executive function deficit: Secondary | ICD-10-CM

## 2020-08-23 DIAGNOSIS — R41842 Visuospatial deficit: Secondary | ICD-10-CM

## 2020-08-23 DIAGNOSIS — R41840 Attention and concentration deficit: Secondary | ICD-10-CM

## 2020-08-23 DIAGNOSIS — R278 Other lack of coordination: Secondary | ICD-10-CM

## 2020-08-23 DIAGNOSIS — R2689 Other abnormalities of gait and mobility: Secondary | ICD-10-CM | POA: Diagnosis not present

## 2020-08-23 NOTE — Patient Instructions (Addendum)
Access Code: [email or web address] URL: [email or web address] Date: 08/23/2020 Prepared by: Willow Ora  Exercises Seated Knee Extension with Resistance - 1 x daily - 5 x weekly - 1 sets - 10 reps Heel Toe Raises with Counter Support - 1 x daily - 5 x weekly - 1 sets - 10 reps Side Stepping with Counter Support - 1 x daily - 5 x weekly - 1 sets - 3 reps Standing Single Leg Stance with Counter Support - 1 x daily - 5 x weekly - 1 sets - 3 reps - 15 hold Standing Tandem Balance with Counter Support - 1 x daily - 5 x weekly - 1 sets - 3 reps - 30 hold Standing Hip Abduction with Counter Support - 1 x daily - 3 x weekly - 3 sets - 10 reps Standing Hip Extension with Counter Support - 1 x daily - 3 x weekly - 3 sets - 10 reps Alternating Step Taps with Counter Support - 1 x daily - 3 x weekly - 2-3 sets - 10 reps

## 2020-08-23 NOTE — Therapy (Signed)
[facility] [address] [place], [place], [address] Phone: [phone]   Fax:  [phone]  Occupational Therapy Treatment  Patient Details  Name: Isaiah [name] [facility] MRN: [id number] Date of Birth: 1966/03/07 Referring Provider (OT): Posey Pronto, Ankit A.   Encounter Date: 08/23/2020   OT End of Session - 08/23/20 1505    Visit Number 14    Number of Visits 20    Date for OT Re-Evaluation 09/04/20    Authorization Type Medicare    Authorization Time Period WEEK 2/4    Authorization - Visit Number 13    Authorization - Number of Visits 20    Progress Note Due on Visit 69    OT Start Time 1451    OT Stop Time 1530    OT Time Calculation (min) 39 min    Activity Tolerance Patient tolerated treatment well    Behavior During Therapy WFL for tasks assessed/performed           Past Medical History:  Diagnosis Date  . A-fib ([place])   . Anemia   . Asthma   . DM type 2 (diabetes mellitus, type 2) ([place]) 06/09/2019  . ESRD (end stage renal disease) on dialysis ([place])   . Essential hypertension 06/09/2019  . GIB (gastrointestinal bleeding)    Recurrent episodes- 09/2014, 09/2015 and 05/2016  . Gout   . History of recent blood transfusion 10/27/14   2 Units PRBC's  . Hyperkalemia 07/2011  . Multiple myeloma ([place])   . OSA on CPAP   . Pulmonary embolism ([place]) 07/2011; 09/27/2014   a. Bilat PE 07/2011 - unclear cause, tx with 6 months Coumadin.;   . Seizure disorder ([place]) 06/09/2019  . Sepsis ([place])   . Sickle cell-thalassemia disease ([place])    a. Sickle cell trait.  . Sleep apnea   . Stroke ([place]) 09/2015   R-MCA, L-MCA, PCA and bilateral cerebellar complicated by DVT/PE  . Subdural hematoma ([place]) 05/2019    Past Surgical History:  Procedure Laterality Date  . AV FISTULA PLACEMENT Right 12/05/2015   Procedure: INSERTION OF ARTERIOVENOUS (AV) GORE-TEX GRAFT ARM;  Surgeon: Elam Dutch, MD;  Location: [facility];  Service: Vascular;   Laterality: Right;  . BONE MARROW BIOPSY    . CATARACT EXTRACTION  08/2019  . CATARACT EXTRACTION  09/2019  . CHOLECYSTECTOMY    . CHOLECYSTECTOMY  1990's?  . COLONOSCOPY WITH PROPOFOL N/A 01/22/2017   Procedure: COLONOSCOPY WITH PROPOFOL;  Surgeon: Wilford Corner, MD;  [facility];  Service: Endoscopy;  Laterality: N/A;  . EYE SURGERY Right   . INSERTION OF DIALYSIS CATHETER Right 10/16/2015   Procedure: INSERTION OF PALINDROME DIALYSIS CATHETER ;  Surgeon: Elam Dutch, MD;  Location: [facility];  Service: Vascular;  Laterality: Right;  . OTHER SURGICAL HISTORY     Retinal surgery  . PARS PLANA VITRECTOMY  02/17/2012   Procedure: PARS PLANA VITRECTOMY WITH 25 GAUGE;  Surgeon: Hayden Pedro, MD;  Location: [facility];  Service: Ophthalmology;  Laterality: Right;  . PERIPHERAL VASCULAR CATHETERIZATION N/A 03/20/2016   Procedure: A/V Shuntogram/Fistulagram;  Surgeon: Conrad Vicksburg, MD;  Location: [facility];  Service: Cardiovascular;  Laterality: N/A;  . TEE WITHOUT CARDIOVERSION N/A 12/02/2016   Procedure: TRANSESOPHAGEAL ECHOCARDIOGRAM (TEE);  Surgeon: Larey Dresser, MD;  Location: [facility];  Service: Cardiovascular;  Laterality: N/A;    There were no vitals filed for this visit.   Subjective Assessment - 08/23/20 1505  Subjective  Pt denies any changes    Pertinent History multiple myeloma (currently undergoing chemo treatment), DM, CVA, DVT, seizure disorder, SDH, ESRD on dialysis, and history of SDH x 2    Limitations Fall. No Driving. RUE fistula-dialysis; hx of seizures    Patient Stated Goals write better with his right hand, pick things up better with R hand    Currently in Pain? No/denies                     Treatment: placing and removing large to small pegs from semicircle pegboard, mod-max difficulty with the smallest pegs,  min facilitation/ v.c due to decreased coordination/ and visual deficits, increased time required due to severity of  deficits. Tracing circles with highlighter using RUE, mod difficulty/ v.c for grasp             OT Short Term Goals - 08/07/20 1420      OT SHORT TERM GOAL #1   Title Patient and caregiver will be independent with HEP 07/12/20    Time 4    Period Weeks    Status Achieved    Target Date 07/12/20      OT SHORT TERM GOAL #2   Title Patient will perform UB dressing with min A in order to increase independence with ADLs    Time 4    Period Weeks    Status Achieved      OT SHORT TERM GOAL #3   Title Patient will demonstrate improved function use of RUE as evidenced by increasing Box and Blocks score to 12 blocks or more.    Baseline RUE 9    Time 4    Period Weeks    Status Achieved   RUE 12     OT SHORT TERM GOAL #4   Title Pt will improve RUE grip strength by at least 3 lbs or more in order to manage clothing with more independence with toileting    Baseline 22.4    Time 4    Period Weeks    Status Achieved   25.7     OT SHORT TERM GOAL #5   Title Pt will perform all aspects of toileting with min A consistently    Time 4    Period Weeks    Status On-going   unable to get an accurate answer as family not present for session this day 11/18     OT SHORT TERM GOAL #6   Title Pt and spouse will report patient utilizing RUE for functional tasks 15% of the time in daily activities.    Time 4    Period Weeks    Status Achieved             OT Long Term Goals - 08/07/20 1420      OT LONG TERM GOAL #1   Title Pt will be independent with updated HEP 08/09/2020    Time 8    Period Weeks    Status New      OT LONG TERM GOAL #2   Title Pt will improve RUE shoulder flexion to 110 degrees in order to obtain lightweight object from overhead cabinet.    Time 8    Period Weeks    Status New      OT LONG TERM GOAL #3   Title Patient will demonstrate improved function use of RUE as evidenced by increasing Box and Blocks score to 17 blocks or more.    Time 8  Period  Weeks    Status On-going   12     OT LONG TERM GOAL #4   Title Patient will perform UB dressing with supervision with verbal cues in order to increase indepedence with ADLs.    Time 8    Period Weeks    Status Achieved      OT LONG TERM GOAL #5   Title Pt will improve RUE grip strength by at least 6 lbs in order to manage clothing with more independence with toileting    Baseline 22.4 RUE    Time 8    Period Weeks    Status On-going   25.4     OT LONG TERM GOAL #6   Title Pt will perform all aspects of toileting with distant supervision for safety in order to increase independence with ADLs.    Time 8    Period Weeks    Status On-going      OT LONG TERM GOAL #7   Title Pt and spouse will report patient utilizing RUE for functional tasks 25% of the time in daily activities.    Time 8    Period Weeks    Status On-going                  Patient will benefit from skilled therapeutic intervention in order to improve the following deficits and impairments:           Visit Diagnosis: Other lack of coordination  Attention and concentration deficit  Visuospatial deficit  Muscle weakness (generalized)  Frontal lobe and executive function deficit    Problem List Patient Active Problem List   Diagnosis Date Noted  . Colitis 03/15/2020  . AMS (altered mental status) 10/05/2019  . Acute metabolic encephalopathy 56/97/9480  . Headache, unspecified 09/28/2019  . Abnormality of gait 09/13/2019  . Diabetes mellitus with end-stage renal disease ([place]) 08/07/2019  . Benign hypertensive kidney disease with chronic kidney disease 08/07/2019  . Hypothyroidism, unspecified 07/15/2019  . Spastic hemiparesis ([place]) 07/12/2019  . Dysphagia, oropharyngeal phase 07/05/2019  . Allergy, unspecified, initial encounter 06/27/2019  . PAF (paroxysmal atrial fibrillation) ([place])   . Bacteremia   . Labile blood pressure   . Labile blood glucose   . Controlled type 2 diabetes  mellitus with hyperglycemia, without long-term current use of insulin ([place])   . ESRD on dialysis ([place])   . Right sided weakness   . SDH (subdural hematoma) ([place])   . Chronic intracranial subdural hematoma ([facility]) 06/09/2019  . Multiple myeloma not having achieved remission ([place]) 06/09/2019  . ESRD on hemodialysis ([place]) 06/09/2019  . Essential hypertension 06/09/2019  . DM type 2 (diabetes mellitus, type 2) ([place]) 06/09/2019  . Seizure disorder ([place]) 06/09/2019  . Aspiration pneumonia ([place]) 06/09/2019  . History of bacteremia 06/09/2019  . Atrial fibrillation, chronic ([place]) 06/09/2019  . Bilateral kidney stones 05/31/2019  . Proteus infection 05/28/2019  . History of restrictive pulmonary disease 04/07/2019  . Multiple myeloma ([place]) 05/17/2018  . Goals of care, counseling/discussion 05/17/2018  . Partial idiopathic epilepsy with seizures of localized onset, intractable, without status epilepticus ([place]) 11/02/2017  . Iron deficiency anemia, unspecified 01/22/2017  . COPD (chronic obstructive pulmonary disease) ([place]) 11/11/2016  . CVA (cerebral vascular accident) ([place]) 11/11/2016  . GERD (gastroesophageal reflux disease) 11/11/2016  . GI bleed 11/11/2016  . 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  . Obesity (BMI 30-39.9) 11/11/2016  . Pulmonary hypertension ([place]) 11/11/2016  . Patent foramen ovale 11/11/2016  .  Hyperparathyroidism, secondary renal ([place]) 11/11/2016  . VTE (venous thromboembolism) 11/11/2016  . Hypotension 11/11/2016  . Systemic hypertension 11/11/2016  . Seizure ([place]) 11/11/2016  . Other seizures ([place]) 07/25/2016  . Encounter for removal of sutures 05/27/2016  . Ingrown nail 03/03/2016  . Onychomycosis due to dermatophyte 03/03/2016  . Other acquired hammer toe 03/03/2016  . Hemiparesis affecting right side as late effect of cerebrovascular accident ([place]) 02/28/2016  . Aphasia complicating stroke 48/25/0037  . Seizures ([place]) 01/08/2016  . History of ischemic  left MCA stroke 01/08/2016  . Right sided weakness 01/08/2016  . Atrial fibrillation ([place]) 01/08/2016  . Leukocytosis 01/08/2016  . Benign essential HTN 01/08/2016  . Anemia of chronic disease 01/08/2016  . Controlled type 2 diabetes mellitus with diabetic nephropathy, without long-term current use of insulin ([place]) 01/08/2016  . History of DVT (deep vein thrombosis) 01/08/2016  . 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 Hypocalcemia 12/26/2015  . Long term (current) use of anticoagulants 11/07/2015  . Aftercare including intermittent dialysis ([place]) 11/02/2015  . Other specified coagulation defects ([place]) 11/02/2015  . Complication of vascular dialysis catheter 11/02/2015  . Diarrhea, unspecified 11/02/2015  . Fever, unspecified 11/02/2015  . Pruritus, unspecified 11/02/2015  . Shortness of breath 11/02/2015  . ESRD (end stage renal disease) on dialysis [name])     Javohn Basey 08/23/2020, 3:54 PM  [facility] [address] [place], [place], [address] Phone: [phone]   Fax:  [phone]  Name: Isaiah [name] [facility] MRN: [id number] Date of Birth: 03-06-1966

## 2020-08-23 NOTE — Therapy (Signed)
[facility] [address] [place], [place], [address] Phone: [phone]   Fax:  [phone]  Physical Therapy Treatment  Patient Details  Name: Isaiah [name] [facility] MRN: [id number] Date of Birth: 29-Sep-1965 Referring Provider (PT): Delice Lesch, MD   Encounter Date: 08/23/2020   PT End of Session - 08/23/20 1535    Visit Number 26    Number of Visits 34   per recert 81/44/8185   Date for PT Re-Evaluation 09/24/20    Authorization Type Medicare; KX starting at 07/27/2020 visit    Progress Note Due on Visit 30    PT Start Time 1532    PT Stop Time 1613    PT Time Calculation (min) 41 min    Equipment Utilized During Treatment Gait belt    Activity Tolerance Patient tolerated treatment well    Behavior During Therapy [facility] for tasks assessed/performed           Past Medical History:  Diagnosis Date  . A-fib ([place])   . Anemia   . Asthma   . DM type 2 (diabetes mellitus, type 2) ([place]) 06/09/2019  . ESRD (end stage renal disease) on dialysis ([place])   . Essential hypertension 06/09/2019  . GIB (gastrointestinal bleeding)    Recurrent episodes- 09/2014, 09/2015 and 05/2016  . Gout   . History of recent blood transfusion 10/27/14   2 Units PRBC's  . Hyperkalemia 07/2011  . Multiple myeloma ([place])   . OSA on CPAP   . Pulmonary embolism ([place]) 07/2011; 09/27/2014   a. Bilat PE 07/2011 - unclear cause, tx with 6 months Coumadin.;   . Seizure disorder ([place]) 06/09/2019  . Sepsis ([place])   . Sickle cell-thalassemia disease ([place])    a. Sickle cell trait.  . Sleep apnea   . Stroke ([place]) 09/2015   R-MCA, L-MCA, PCA and bilateral cerebellar complicated by DVT/PE  . Subdural hematoma ([place]) 05/2019    Past Surgical History:  Procedure Laterality Date  . AV FISTULA PLACEMENT Right 12/05/2015   Procedure: INSERTION OF ARTERIOVENOUS (AV) GORE-TEX GRAFT ARM;  Surgeon: Elam Dutch, MD;  Location: [facility];  Service: Vascular;  Laterality:  Right;  . BONE MARROW BIOPSY    . CATARACT EXTRACTION  08/2019  . CATARACT EXTRACTION  09/2019  . CHOLECYSTECTOMY    . CHOLECYSTECTOMY  1990's?  . COLONOSCOPY WITH PROPOFOL N/A 01/22/2017   Procedure: COLONOSCOPY WITH PROPOFOL;  Surgeon: Wilford Corner, MD;  [facility];  Service: Endoscopy;  Laterality: N/A;  . EYE SURGERY Right   . INSERTION OF DIALYSIS CATHETER Right 10/16/2015   Procedure: INSERTION OF PALINDROME DIALYSIS CATHETER ;  Surgeon: Elam Dutch, MD;  Location: [facility];  Service: Vascular;  Laterality: Right;  . OTHER SURGICAL HISTORY     Retinal surgery  . PARS PLANA VITRECTOMY  02/17/2012   Procedure: PARS PLANA VITRECTOMY WITH 25 GAUGE;  Surgeon: Hayden Pedro, MD;  Location: [facility];  Service: Ophthalmology;  Laterality: Right;  . PERIPHERAL VASCULAR CATHETERIZATION N/A 03/20/2016   Procedure: A/V Shuntogram/Fistulagram;  Surgeon: Conrad Olivet, MD;  Location: [facility];  Service: Cardiovascular;  Laterality: N/A;  . TEE WITHOUT CARDIOVERSION N/A 12/02/2016   Procedure: TRANSESOPHAGEAL ECHOCARDIOGRAM (TEE);  Surgeon: Larey Dresser, MD;  Location: [facility];  Service: Cardiovascular;  Laterality: N/A;    There were no vitals filed for this visit.   Subjective Assessment - 08/23/20 1530    Subjective No new complaints. No falls  or pain to report.    Pertinent History multiple myeloma, ESRD on dialysis, DM type II, DVT with IVC filter, CVA, COPD, a-fib; chemo treatments down to 1x/month (next is August 17th)    Patient Stated Goals To get back to walk without the walker, back to his baseline    Currently in Pain? No/denies    Pain Score 0-No pain                [facility] Adult PT Treatment/Exercise - 08/23/20 1537      Transfers   Transfers Sit to Stand;Stand to Sit    Sit to Stand 4: Min guard;With upper extremity assist;From bed    Stand to Sit 4: Min guard;With upper extremity assist;To bed;To chair/3-in-1;Uncontrolled descent    Number  of Reps 1 set;Other reps (comment)   5 reps   Comments needs UE support on sturdy surface to balance if not using retropulsion on mat table. if LE's are against the mat,does not need UE support to stabilize in standing.      Ambulation/Gait   Ambulation/Gait Yes    Ambulation/Gait Assistance 4: Min guard    Ambulation/Gait Assistance Details working on object negotiation with cues for walker position. no bumping of objects/obstacles noted this session.    Ambulation Distance (Feet) 230 Feet   x1   Assistive device Rolling walker    Gait Pattern Step-through pattern;Trunk flexed;Poor foot clearance - left;Poor foot clearance - right    Ambulation Surface Level;Indoor    Gait velocity 19.00 sec's= 1.72 ft/sec with RW with supervision      Self-Care   Self-Care Other Self-Care Comments    Other Self-Care Comments  Reviewed current HEP with pt performing in session. Advanced where it could be. cues needed on form and technique with most. Refer to [place] for full details.           Issued the following to HEP today:  Access Code: [email or web address] URL: [email or web address] Date: 08/23/2020 Prepared by: Willow Ora  Exercises Seated Knee Extension with Resistance - 1 x daily - 5 x weekly - 1 sets - 10 reps Heel Toe Raises with Counter Support - 1 x daily - 5 x weekly - 1 sets - 10 reps Side Stepping with Counter Support - 1 x daily - 5 x weekly - 1 sets - 3 reps Standing Single Leg Stance with Counter Support - 1 x daily - 5 x weekly - 1 sets - 3 reps - 15 hold Standing Tandem Balance with Counter Support - 1 x daily - 5 x weekly - 1 sets - 3 reps - 30 hold Standing Hip Abduction with Counter Support - 1 x daily - 3 x weekly - 3 sets - 10 reps Standing Hip Extension with Counter Support - 1 x daily - 3 x weekly - 3 sets - 10 reps Alternating Step Taps with Counter Support - 1 x daily - 3 x weekly - 2-3 sets - 10 reps        PT Education - 08/23/20 1629    Education  Details progress toward STGs, updated HEP    Person(s) Educated Patient    Methods Explanation;Demonstration;Verbal cues;Handout    Comprehension Verbalized understanding;Returned demonstration;Verbal cues required;Need further instruction               PT Long Term Goals - 07/27/20 1612      PT LONG TERM GOAL #1   Title Pt will be independent with final progression of HEP for improved  strength, blaance, gait.  TARGET 09/07/2020    Time 6    Period Weeks    Status On-going      PT LONG TERM GOAL #2   Title Pt will imrpove Berg Balance score to at least 36/56 for decreased fall risk.    Baseline 30/56 05/31/20; 33/56 11/18    Time 6    Period Weeks    Status Revised      PT LONG TERM GOAL #3   Title Pt will improve TUG score to less than or equal to 30 seconds for decreased fall risk.    Baseline 37 sec 05/31/20; 31.49 sec 07/26/2020    Time 6    Period Weeks    Status On-going      PT LONG TERM GOAL #4   Title Pt will verbalize understanding of plans for continued community fitness upon d/c from PT.    Time 6    Period Weeks    Status Revised      PT LONG TERM GOAL #5   Title Pt will ascend/descent 12 steps with both handrails with supervision only, to negotiate steps at home.    Baseline not attempted 05/31/20; wife reports getting stair lift at home    Time 6    Period Weeks    Status On-going                 Plan - 08/23/20 1536    Clinical Impression Statement Today's skilled session focused on progress toward STGs. Pt has met the current goal for HEP. Program was advanced this session with no issues reported or noted. The pt did not meet his 10 meter gait speed goal as it was slightly slower that when last checked. The pt did meet his sit<>stand goal this session. The pt is progressing and should benefit from continued PT to progress toward unmet goals.    Personal Factors and Comorbidities Comorbidity 3+    Comorbidities PMH:  multiple myeloma, ESRD on  dialysis, DM type II, DVT with IVC filter, CVA, COPD, a-fib    Examination-Activity Limitations Locomotion Level;Transfers;Stairs;Stand    Examination-Participation Restrictions Community Activity    Stability/Clinical Decision Making Evolving/Moderate complexity    Rehab Potential Good    PT Frequency 2x / week    PT Duration 6 weeks   per recert 60/73/7106   PT Treatment/Interventions ADLs/Self Care Home Management;DME Instruction;Neuromuscular re-education;Balance training;Therapeutic exercise;Therapeutic activities;Functional mobility training;Stair training;Gait training;Patient/family education    PT Next Visit Plan continue to work on gait with emphasis on obstacle negotiation, endurance, LE strengthening and balance.   Anticipate planning for d/c at end of scheduled visits in several weeks.    PT Home Exercise Plan Access Code [email or web address]    Consulted and Agree with Plan of Care Patient           Patient will benefit from skilled therapeutic intervention in order to improve the following deficits and impairments:  Abnormal gait,Difficulty walking,Decreased safety awareness,Decreased balance,Decreased mobility,Decreased strength  Visit Diagnosis: Muscle weakness (generalized)  Unsteadiness on feet  Other abnormalities of gait and mobility     Problem List Patient Active Problem List   Diagnosis Date Noted  . Colitis 03/15/2020  . AMS (altered mental status) 10/05/2019  . Acute metabolic encephalopathy 27/11/5007  . Headache, unspecified 09/28/2019  . Abnormality of gait 09/13/2019  . Diabetes mellitus with end-stage renal disease ([place]) 08/07/2019  . Benign hypertensive kidney disease with chronic kidney disease 08/07/2019  . Hypothyroidism, unspecified 07/15/2019  .  Spastic hemiparesis ([place]) 07/12/2019  . Dysphagia, oropharyngeal phase 07/05/2019  . Allergy, unspecified, initial encounter 06/27/2019  . PAF (paroxysmal atrial fibrillation) ([place])   . Bacteremia   .  Labile blood pressure   . Labile blood glucose   . Controlled type 2 diabetes mellitus with hyperglycemia, without long-term current use of insulin ([place])   . ESRD on dialysis ([place])   . Right sided weakness   . SDH (subdural hematoma) ([place])   . Chronic intracranial subdural hematoma ([facility]) 06/09/2019  . Multiple myeloma not having achieved remission ([place]) 06/09/2019  . ESRD on hemodialysis ([place]) 06/09/2019  . Essential hypertension 06/09/2019  . DM type 2 (diabetes mellitus, type 2) ([place]) 06/09/2019  . Seizure disorder ([place]) 06/09/2019  . Aspiration pneumonia ([place]) 06/09/2019  . History of bacteremia 06/09/2019  . Atrial fibrillation, chronic ([place]) 06/09/2019  . Bilateral kidney stones 05/31/2019  . Proteus infection 05/28/2019  . History of restrictive pulmonary disease 04/07/2019  . Multiple myeloma ([place]) 05/17/2018  . Goals of care, counseling/discussion 05/17/2018  . Partial idiopathic epilepsy with seizures of localized onset, intractable, without status epilepticus ([place]) 11/02/2017  . Iron deficiency anemia, unspecified 01/22/2017  . COPD (chronic obstructive pulmonary disease) ([place]) 11/11/2016  . CVA (cerebral vascular accident) ([place]) 11/11/2016  . GERD (gastroesophageal reflux disease) 11/11/2016  . GI bleed 11/11/2016  . 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  . Obesity (BMI 30-39.9) 11/11/2016  . Pulmonary hypertension ([place]) 11/11/2016  . Patent foramen ovale 11/11/2016  . Hyperparathyroidism, secondary renal ([place]) 11/11/2016  . VTE (venous thromboembolism) 11/11/2016  . Hypotension 11/11/2016  . Systemic hypertension 11/11/2016  . Seizure ([place]) 11/11/2016  . Other seizures ([place]) 07/25/2016  . Encounter for removal of sutures 05/27/2016  . Ingrown nail 03/03/2016  . Onychomycosis due to dermatophyte 03/03/2016  . Other acquired hammer toe 03/03/2016  . Hemiparesis affecting right side as late effect of cerebrovascular accident ([place]) 02/28/2016  . Aphasia  complicating stroke 57/90/3833  . Seizures ([place]) 01/08/2016  . History of ischemic left MCA stroke 01/08/2016  . Right sided weakness 01/08/2016  . Atrial fibrillation ([place]) 01/08/2016  . Leukocytosis 01/08/2016  . Benign essential HTN 01/08/2016  . Anemia of chronic disease 01/08/2016  . Controlled type 2 diabetes mellitus with diabetic nephropathy, without long-term current use of insulin ([place]) 01/08/2016  . History of DVT (deep vein thrombosis) 01/08/2016  . 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 Hypocalcemia 12/26/2015  . Long term (current) use of anticoagulants 11/07/2015  . Aftercare including intermittent dialysis ([place]) 11/02/2015  . Other specified coagulation defects ([place]) 11/02/2015  . Complication of vascular dialysis catheter 11/02/2015  . Diarrhea, unspecified 11/02/2015  . Fever, unspecified 11/02/2015  . Pruritus, unspecified 11/02/2015  . Shortness of breath 11/02/2015  . ESRD (end stage renal disease) on dialysis ([place])     Willow Ora, PTA, [place] [address], [place], [place] [address] [phone] 08/23/20, 4:51 PM   Name: Isaiah [name] [facility] MRN: [id number] Date of Birth: 08-Sep-1966

## 2020-08-24 DIAGNOSIS — D631 Anemia in chronic kidney disease: Secondary | ICD-10-CM | POA: Diagnosis not present

## 2020-08-24 DIAGNOSIS — Z992 Dependence on renal dialysis: Secondary | ICD-10-CM | POA: Diagnosis not present

## 2020-08-24 DIAGNOSIS — E1129 Type 2 diabetes mellitus with other diabetic kidney complication: Secondary | ICD-10-CM | POA: Diagnosis not present

## 2020-08-24 DIAGNOSIS — N186 End stage renal disease: Secondary | ICD-10-CM | POA: Diagnosis not present

## 2020-08-24 DIAGNOSIS — N2581 Secondary hyperparathyroidism of renal origin: Secondary | ICD-10-CM | POA: Diagnosis not present

## 2020-08-27 ENCOUNTER — Telehealth: Payer: Self-pay | Admitting: Neurology

## 2020-08-27 ENCOUNTER — Other Ambulatory Visit: Payer: Self-pay

## 2020-08-27 ENCOUNTER — Ambulatory Visit (INDEPENDENT_AMBULATORY_CARE_PROVIDER_SITE_OTHER): Payer: Medicare Other | Admitting: Internal Medicine

## 2020-08-27 ENCOUNTER — Encounter: Payer: Self-pay | Admitting: Internal Medicine

## 2020-08-27 VITALS — BP 122/64 | HR 53 | Temp 97.8°F

## 2020-08-27 DIAGNOSIS — R4182 Altered mental status, unspecified: Secondary | ICD-10-CM

## 2020-08-27 DIAGNOSIS — R440 Auditory hallucinations: Secondary | ICD-10-CM

## 2020-08-27 DIAGNOSIS — I120 Hypertensive chronic kidney disease with stage 5 chronic kidney disease or end stage renal disease: Secondary | ICD-10-CM

## 2020-08-27 DIAGNOSIS — N186 End stage renal disease: Secondary | ICD-10-CM

## 2020-08-27 DIAGNOSIS — N2581 Secondary hyperparathyroidism of renal origin: Secondary | ICD-10-CM | POA: Diagnosis not present

## 2020-08-27 DIAGNOSIS — E1129 Type 2 diabetes mellitus with other diabetic kidney complication: Secondary | ICD-10-CM | POA: Diagnosis not present

## 2020-08-27 DIAGNOSIS — D631 Anemia in chronic kidney disease: Secondary | ICD-10-CM | POA: Diagnosis not present

## 2020-08-27 DIAGNOSIS — Z992 Dependence on renal dialysis: Secondary | ICD-10-CM

## 2020-08-27 NOTE — Telephone Encounter (Signed)
Patient's wife called in stating the patient has been waking up seeing and hearing people and things in the house that are not there and trying to get out of bed by himself when he shouldn't be. His blood pressure has been high. She is worried and would like to talk to someone.

## 2020-08-27 NOTE — Progress Notes (Signed)
I,[name],acting as a Education administrator for [name], MD.,have documented all relevant documentation on the behalf of [name], MD,as directed by  [name], MD while in the presence of [name], MD.  This visit occurred during the SARS-CoV-2 public health emergency.  Safety protocols were in place, including screening questions prior to the visit, additional usage of staff PPE, and extensive cleaning of exam room while observing appropriate contact time as indicated for disinfecting solutions.  Subjective:     Patient ID: Isaiah [name] , male    DOB: 1965-10-02 , 54 y.o.   MRN: [id number]   Chief Complaint  Patient presents with  . Altered Mental Status    HPI  Patient is here today to be evaluated for altered mental status. He is accompanied by his wife, Isaiah [name].   His wife states that he has been seeing people and accusing her of cheating. She also states that his sleeping habits have changed. Mr [place] states that none of this is true and that he doesn't see people but he does hear his wife in the other room or hallway on the phone.  Wife is adamant that she is not speaking to any other men on her phone. He adds that both she and her son have gotten new phones. They both deny he has had recent URI or other infection. No fever/chills or any other new neurological symptoms.   She has brought him here today to be evaluated for possible UTI based on his actions. However, he has ESRD and voids very little. He does not wish to be catheterized.     Past Medical History:  Diagnosis Date  . A-fib ([place])   . Anemia   . Asthma   . DM type 2 (diabetes mellitus, type 2) ([place]) 06/09/2019  . ESRD (end stage renal disease) on dialysis ([place])   . Essential hypertension 06/09/2019  . GIB (gastrointestinal bleeding)    Recurrent episodes- 09/2014, 09/2015 and 05/2016  . Gout   . History of recent blood transfusion 10/27/14   2 Units PRBC's  . Hyperkalemia 07/2011  . Multiple  myeloma ([place])   . OSA on CPAP   . Pulmonary embolism ([place]) 07/2011; 09/27/2014   a. Bilat PE 07/2011 - unclear cause, tx with 6 months Coumadin.;   . Seizure disorder ([place]) 06/09/2019  . Sepsis ([place])   . Sickle cell-thalassemia disease ([place])    a. Sickle cell trait.  . Sleep apnea   . Stroke ([place]) 09/2015   R-MCA, L-MCA, PCA and bilateral cerebellar complicated by DVT/PE  . Subdural hematoma ([place]) 05/2019     Family History  Problem Relation Age of Onset  . Hypertension Mother   . Hypertension Father   . Stroke Father   . Sickle cell anemia Brother   . Diabetes Mother   . Sickle cell anemia Brother   . Diabetes type I Brother   . Kidney disease Brother      Current Outpatient Medications:  .  acetaminophen (TYLENOL) 325 MG tablet, Take 1-2 tablets (325-650 mg total) by mouth every 4 (four) hours as needed for mild pain., Disp:  , Rfl:  .  acyclovir (ZOVIRAX) 400 MG tablet, TAKE 1 TABLET BY MOUTH ONCE DAILY, Disp: 90 tablet, Rfl: 0 .  albuterol (PROAIR HFA) 108 (90 Base) MCG/ACT inhaler, Inhale two puffs every 4-6 hours if needed for cough or wheeze (Patient taking differently: Inhale 2 puffs into the lungs every 4 (four) hours as  needed for wheezing or shortness of breath. Inhale two puffs every 4-6 hours if needed for cough or wheeze), Disp: 6.7 g, Rfl: 2 .  Alcohol Swabs (SM ALCOHOL PREP) 70 % PADS, , Disp: , Rfl:  .  atorvastatin (LIPITOR) 20 MG tablet, TAKE 1 TABLET BY MOUTH ONCE A DAY, Disp: 30 tablet, Rfl: 0 .  AURYXIA 1 GM 210 MG(Fe) tablet, Take 210 mg by mouth 3 (three) times daily., Disp: , Rfl:  .  BREO ELLIPTA 200-25 MCG/INH AEPB, INHALE 1 PUFF INTO THE LUNGS AT THE SAME TIME EVERY DAY, Disp: 60 each, Rfl: 2 .  calcium acetate (PHOSLO) 667 MG capsule, Take by mouth., Disp: , Rfl:  .  camphor-menthol (SARNA) lotion, Apply 1 application topically every 8 (eight) hours as needed for itching., Disp: 222 mL, Rfl: 0 .  Cetirizine HCl 10 MG CAPS, Take 1 capsule by mouth daily.  , Disp: , Rfl:  .  Cholecalciferol (D3-1000 PO), Take by mouth., Disp: , Rfl:  .  Cinacalcet HCl (SENSIPAR PO), Take by mouth.  (Patient not taking: Reported on 07/19/2020), Disp: , Rfl:  .  clonazePAM (KLONOPIN) 0.5 MG tablet, Take 2 tablets every Monday, Wednesday, and Friday with hemodialysis, Disp: 30 tablet, Rfl: 5 .  FREESTYLE LITE test strip, , Disp: , Rfl:  .  hydrOXYzine (ATARAX/VISTARIL) 25 MG tablet, TAKE 1 TABLET BY MOUTH EVERY 8 HOURS AS NEEDED FOR ITCHING, Disp: 30 tablet, Rfl: 0 .  Insulin Glargine (BASAGLAR KWIKPEN) 100 UNIT/ML, Inject 8 Units into the skin daily., Disp: 3 mL, Rfl: 3 .  Insulin Lispro (HUMALOG KWIKPEN [place]), Inject 2-4 Units into the skin 3 (three) times daily before meals. Sliding scale: 150-250 =2 units, 250 -350=3 units, 350 += 4 units, Disp: , Rfl:  .  lacosamide (VIMPAT) 200 MG TABS tablet, Take 1 tablet twice a day except on dialysis days M-W-F give 1 tab in AM, 1 tab after dialysis, 1 tab in PM., Disp: 90 tablet, Rfl: 5 .  lamoTRIgine (LAMICTAL) 200 MG tablet, Take 1 tablet (200 mg total) by mouth 2 (two) times daily., Disp: 60 tablet, Rfl: 11 .  Lancets (FREESTYLE) lancets, , Disp: , Rfl:  .  metoprolol tartrate (LOPRESSOR) 25 MG tablet, Take 25 mg by mouth daily., Disp: , Rfl:  .  midodrine (PROAMATINE) 10 MG tablet, TAKE 1 TABLET BY MOUTH TWICE DAILY WITH MEALS, Disp: 60 tablet, Rfl: 0 .  multivitamin (RENA-VIT) TABS tablet, Take 1 tablet by mouth at bedtime., Disp: , Rfl:  .  Nutritional Supplements (FEEDING SUPPLEMENT, NEPRO CARB STEADY,) LIQD, Take 237 mLs by mouth 3 (three) times daily as needed (Supplement)., Disp: 237 mL, Rfl: 12 .  pantoprazole (PROTONIX) 40 MG tablet, TAKE 1 TABLET BY MOUTH ONCE DAILY, Disp: 90 tablet, Rfl: 1 .  prochlorperazine (COMPAZINE) 10 MG tablet, TAKE 1 TABLET (10 MG TOTAL) BY MOUTH EVERY 6 (SIX) HOURS AS NEEDED FOR NAUSEA OR VOMITING., Disp: 30 tablet, Rfl: 0 .  UNIFINE PENTIPS 31G X 8 MM MISC, USE AS DIRECTED DAILY AFTER  SUPPER, Disp: 100 each, Rfl: 3   Allergies  Allergen Reactions  . Codeine Swelling  . Codeine Rash and Other (See Comments)    Unknown reaction (patient says it was more serious than just a rash, but he can't remember what happened)      Review of Systems  Constitutional: Negative.   Respiratory: Negative.   Cardiovascular: Negative.   Gastrointestinal: Negative.   Neurological: Negative.  Negative for dizziness.  Today's Vitals   08/27/20 1503  BP: 122/64  Pulse: (!) 53  Temp: 97.8 F (36.6 C)  TempSrc: Oral   There is no height or weight on file to calculate BMI.   Objective:  Physical Exam Vitals and nursing note reviewed.  Constitutional:      Appearance: Normal appearance. He is obese.  Cardiovascular:     Rate and Rhythm: Normal rate and regular rhythm.     Heart sounds: Normal heart sounds.  Pulmonary:     Effort: Pulmonary effort is normal.     Breath sounds: Normal breath sounds.  Skin:    General: Skin is warm.  Neurological:     General: No focal deficit present.     Mental Status: He is alert.     Comments: He is oriented to self, place. He did not give correct date.  Psychiatric:        Mood and Affect: Mood normal.         Assessment And Plan:     1. Altered mental status, unspecified altered mental status type Comments: I will review labs drawn at dialysis. He declines cath to obtain urine specimen. He appears to be oriented today. She agrees to bring him back in should his sx persist/worsen. I personally spent 25 minutes or more in face to face visit with both the patient and his wife.   2. Auditory hallucinations Comments: Pt denies this. They are both encouraged to consider counseling for marital discord.   3. Benign hypertensive kidney disease with chronic kidney disease stage V or end stage renal disease ([place]) Comments: Chronic, well controlled. He will continue with current meds.    Patient was given opportunity to ask questions.  Patient verbalized understanding of the plan and was able to repeat key elements of the plan. All questions were answered to their satisfaction.  [name], MD   I, [name], MD, have reviewed all documentation for this visit. The documentation on 09/24/20 for the exam, diagnosis, procedures, and orders are all accurate and complete.  THE PATIENT IS ENCOURAGED TO PRACTICE SOCIAL DISTANCING DUE TO THE COVID-19 PANDEMIC.

## 2020-08-27 NOTE — Telephone Encounter (Signed)
I advised to contact pcp. Call back if necessary.

## 2020-08-28 ENCOUNTER — Encounter: Payer: Medicare Other | Admitting: Occupational Therapy

## 2020-08-28 ENCOUNTER — Ambulatory Visit: Payer: Medicare Other | Admitting: Physical Therapy

## 2020-08-28 DIAGNOSIS — E109 Type 1 diabetes mellitus without complications: Secondary | ICD-10-CM | POA: Diagnosis not present

## 2020-08-28 LAB — HEMOGLOBIN A1C: Hemoglobin A1C: 5.3

## 2020-08-29 DIAGNOSIS — E1129 Type 2 diabetes mellitus with other diabetic kidney complication: Secondary | ICD-10-CM | POA: Diagnosis not present

## 2020-08-29 DIAGNOSIS — N2581 Secondary hyperparathyroidism of renal origin: Secondary | ICD-10-CM | POA: Diagnosis not present

## 2020-08-29 DIAGNOSIS — D631 Anemia in chronic kidney disease: Secondary | ICD-10-CM | POA: Diagnosis not present

## 2020-08-29 DIAGNOSIS — N186 End stage renal disease: Secondary | ICD-10-CM | POA: Diagnosis not present

## 2020-08-29 DIAGNOSIS — Z992 Dependence on renal dialysis: Secondary | ICD-10-CM | POA: Diagnosis not present

## 2020-08-30 ENCOUNTER — Ambulatory Visit: Payer: Medicare Other | Admitting: Occupational Therapy

## 2020-08-30 ENCOUNTER — Encounter: Payer: Self-pay | Admitting: Occupational Therapy

## 2020-08-30 ENCOUNTER — Other Ambulatory Visit: Payer: Self-pay

## 2020-08-30 ENCOUNTER — Ambulatory Visit: Payer: Medicare Other | Admitting: Physical Therapy

## 2020-08-30 ENCOUNTER — Encounter: Payer: Self-pay | Admitting: Internal Medicine

## 2020-08-30 DIAGNOSIS — R278 Other lack of coordination: Secondary | ICD-10-CM

## 2020-08-30 DIAGNOSIS — R41842 Visuospatial deficit: Secondary | ICD-10-CM | POA: Diagnosis not present

## 2020-08-30 DIAGNOSIS — R41844 Frontal lobe and executive function deficit: Secondary | ICD-10-CM | POA: Diagnosis not present

## 2020-08-30 DIAGNOSIS — R2681 Unsteadiness on feet: Secondary | ICD-10-CM

## 2020-08-30 DIAGNOSIS — R2689 Other abnormalities of gait and mobility: Secondary | ICD-10-CM | POA: Diagnosis not present

## 2020-08-30 DIAGNOSIS — M6281 Muscle weakness (generalized): Secondary | ICD-10-CM | POA: Diagnosis not present

## 2020-08-30 DIAGNOSIS — R41840 Attention and concentration deficit: Secondary | ICD-10-CM | POA: Diagnosis not present

## 2020-08-30 NOTE — Therapy (Signed)
[facility] [address] [place], [place], [address] Phone: [phone]   Fax:  [phone]  Occupational Therapy Treatment  Patient Details  Name: Isaiah [name] [facility] MRN: [id number] Date of Birth: 06-Apr-1966 Referring Provider (OT): Posey Pronto, Ankit A.   Encounter Date: 08/30/2020   OT End of Session - 08/30/20 1153    Visit Number 15    Number of Visits 20    Date for OT Re-Evaluation 09/04/20    Authorization Type Medicare    Authorization Time Period WEEK 3/4    Authorization - Visit Number 15    Authorization - Number of Visits 20    Progress Note Due on Visit 20    OT Start Time 1151    OT Stop Time 1230    OT Time Calculation (min) 39 min    Activity Tolerance Patient tolerated treatment well    Behavior During Therapy WFL for tasks assessed/performed           Past Medical History:  Diagnosis Date  . A-fib ([place])   . Anemia   . Asthma   . DM type 2 (diabetes mellitus, type 2) ([place]) 06/09/2019  . ESRD (end stage renal disease) on dialysis ([place])   . Essential hypertension 06/09/2019  . GIB (gastrointestinal bleeding)    Recurrent episodes- 09/2014, 09/2015 and 05/2016  . Gout   . History of recent blood transfusion 10/27/14   2 Units PRBC's  . Hyperkalemia 07/2011  . Multiple myeloma ([place])   . OSA on CPAP   . Pulmonary embolism ([place]) 07/2011; 09/27/2014   a. Bilat PE 07/2011 - unclear cause, tx with 6 months Coumadin.;   . Seizure disorder ([place]) 06/09/2019  . Sepsis ([place])   . Sickle cell-thalassemia disease ([place])    a. Sickle cell trait.  . Sleep apnea   . Stroke ([place]) 09/2015   R-MCA, L-MCA, PCA and bilateral cerebellar complicated by DVT/PE  . Subdural hematoma ([place]) 05/2019    Past Surgical History:  Procedure Laterality Date  . AV FISTULA PLACEMENT Right 12/05/2015   Procedure: INSERTION OF ARTERIOVENOUS (AV) GORE-TEX GRAFT ARM;  Surgeon: Elam Dutch, MD;  Location: [facility];  Service: Vascular;   Laterality: Right;  . BONE MARROW BIOPSY    . CATARACT EXTRACTION  08/2019  . CATARACT EXTRACTION  09/2019  . CHOLECYSTECTOMY    . CHOLECYSTECTOMY  1990's?  . COLONOSCOPY WITH PROPOFOL N/A 01/22/2017   Procedure: COLONOSCOPY WITH PROPOFOL;  Surgeon: Wilford Corner, MD;  Location: [facility];  Service: Endoscopy;  Laterality: N/A;  . EYE SURGERY Right   . INSERTION OF DIALYSIS CATHETER Right 10/16/2015   Procedure: INSERTION OF PALINDROME DIALYSIS CATHETER ;  Surgeon: Elam Dutch, MD;  Location: [facility];  Service: Vascular;  Laterality: Right;  . OTHER SURGICAL HISTORY     Retinal surgery  . PARS PLANA VITRECTOMY  02/17/2012   Procedure: PARS PLANA VITRECTOMY WITH 25 GAUGE;  Surgeon: Hayden Pedro, MD;  Location: [facility];  Service: Ophthalmology;  Laterality: Right;  . PERIPHERAL VASCULAR CATHETERIZATION N/A 03/20/2016   Procedure: A/V Shuntogram/Fistulagram;  Surgeon: Conrad Iron River, MD;  Location: [facility];  Service: Cardiovascular;  Laterality: N/A;  . TEE WITHOUT CARDIOVERSION N/A 12/02/2016   Procedure: TRANSESOPHAGEAL ECHOCARDIOGRAM (TEE);  Surgeon: Larey Dresser, MD;  Location: [facility];  Service: Cardiovascular;  Laterality: N/A;    There were no vitals filed for this visit.   Subjective Assessment - 08/30/20 1154  Subjective  Pt denies any changes or pain.    Pertinent History multiple myeloma (currently undergoing chemo treatment), DM, CVA, DVT, seizure disorder, SDH, ESRD on dialysis, and history of SDH x 2    Limitations Fall. No Driving. RUE fistula-dialysis; hx of seizures    Patient Stated Goals write better with his right hand, pick things up better with R hand    Currently in Pain? No/denies                        OT Treatments/Exercises (OP) - 08/30/20 1158      Visual/Perceptual Exercises   Visual Motor Integration tracing letters - visual deficits noted as tracing is approximately 1/4" at times to left of printed letter. Pt did  better with tasks without glasses.      Fine Motor Coordination (Hand/Wrist)   Fine Motor Coordination Handwriting    Handwriting practicing handwriting with tracing of uppercase ABC's with RUE. Pt with deviation approximately 1/4" with highlighter to left of letters at times and required increased time. Pt worked on free handing letters but with poor legibility (<10%) and poor letter formation and sizing. Pt encouraged to take a pause between each letter and write big. Pt imrpoved legibility with writing with dry erase board and wide barrel marker. Pt demonstrated ability to write first name with approximately 70% legibility and accuracy on white board and with wide barrel marker.                    OT Short Term Goals - 08/07/20 1420      OT SHORT TERM GOAL #1   Title Patient and caregiver will be independent with HEP 07/12/20    Time 4    Period Weeks    Status Achieved    Target Date 07/12/20      OT SHORT TERM GOAL #2   Title Patient will perform UB dressing with min A in order to increase independence with ADLs    Time 4    Period Weeks    Status Achieved      OT SHORT TERM GOAL #3   Title Patient will demonstrate improved function use of RUE as evidenced by increasing Box and Blocks score to 12 blocks or more.    Baseline RUE 9    Time 4    Period Weeks    Status Achieved   RUE 12     OT SHORT TERM GOAL #4   Title Pt will improve RUE grip strength by at least 3 lbs or more in order to manage clothing with more independence with toileting    Baseline 22.4    Time 4    Period Weeks    Status Achieved   25.7     OT SHORT TERM GOAL #5   Title Pt will perform all aspects of toileting with min A consistently    Time 4    Period Weeks    Status On-going   unable to get an accurate answer as family not present for session this day 11/18     OT SHORT TERM GOAL #6   Title Pt and spouse will report patient utilizing RUE for functional tasks 15% of the time in daily  activities.    Time 4    Period Weeks    Status Achieved             OT Long Term Goals - 08/07/20 1420  OT LONG TERM GOAL #1   Title Pt will be independent with updated HEP 08/09/2020    Time 8    Period Weeks    Status New      OT LONG TERM GOAL #2   Title Pt will improve RUE shoulder flexion to 110 degrees in order to obtain lightweight object from overhead cabinet.    Time 8    Period Weeks    Status New      OT LONG TERM GOAL #3   Title Patient will demonstrate improved function use of RUE as evidenced by increasing Box and Blocks score to 17 blocks or more.    Time 8    Period Weeks    Status On-going   12     OT LONG TERM GOAL #4   Title Patient will perform UB dressing with supervision with verbal cues in order to increase indepedence with ADLs.    Time 8    Period Weeks    Status Achieved      OT LONG TERM GOAL #5   Title Pt will improve RUE grip strength by at least 6 lbs in order to manage clothing with more independence with toileting    Baseline 22.4 RUE    Time 8    Period Weeks    Status On-going   25.4     OT LONG TERM GOAL #6   Title Pt will perform all aspects of toileting with distant supervision for safety in order to increase independence with ADLs.    Time 8    Period Weeks    Status On-going      OT LONG TERM GOAL #7   Title Pt and spouse will report patient utilizing RUE for functional tasks 25% of the time in daily activities.    Time 8    Period Weeks    Status On-going                 Plan - 08/30/20 1207    Clinical Impression Statement pt's vision deficits impeding overall progress but pt continuing to make progress towards goals.    OT Occupational Profile and History Detailed Assessment- Review of Records and additional review of physical, cognitive, psychosocial history related to current functional performance    Occupational performance deficits (Please refer to evaluation for details): IADL's;ADL's;Leisure;Social  Participation    Body Structure / Function / Physical Skills ADL;Decreased knowledge of use of DME;Gait;Strength;Tone;GMC;Dexterity;Balance;Body mechanics;Proprioception;UE functional use;ROM;IADL;Coordination;FMC;Vision;Flexibility;Mobility    Cognitive Skills Attention;Problem Solve;Safety Awareness;Sequencing;Energy/Drive;Thought;Learn;Memory;Understand;Perception    Rehab Potential Good    Clinical Decision Making Limited treatment options, no task modification necessary    Comorbidities Affecting Occupational Performance: May have comorbidities impacting occupational performance    Modification or Assistance to Complete Evaluation  No modification of tasks or assist necessary to complete eval    OT Frequency 2x / week    OT Duration 8 weeks    OT Treatment/Interventions Self-care/ADL training;Moist Heat;Fluidtherapy;DME and/or AE instruction;Balance training;Gait Training;Therapeutic activities;Cognitive remediation/compensation;Therapeutic exercise;Ultrasound;Neuromuscular education;Passive range of motion;Visual/perceptual remediation/compensation;Functional Mobility Training;Electrical Stimulation;Energy conservation;Manual Therapy;Patient/family education    Plan safety with ambulation and visual scanning, ADL strategies, RUE functional use    Recommended Other Services Recommended ST referral - spouse asked to wait.    Consulted and Agree with Plan of Care Patient           Patient will benefit from skilled therapeutic intervention in order to improve the following deficits and impairments:   Body Structure / Function / Physical Skills:  ADL,Decreased knowledge of use of DME,Gait,Strength,Tone,GMC,Dexterity,Balance,Body mechanics,Proprioception,UE functional use,ROM,IADL,Coordination,FMC,Vision,Flexibility,Mobility Cognitive Skills: Attention,Problem Solve,Safety Awareness,Sequencing,Energy/Drive,Thought,Learn,Memory,Understand,Perception     Visit Diagnosis: Other lack of  coordination  Attention and concentration deficit  Other abnormalities of gait and mobility  Visuospatial deficit  Muscle weakness (generalized)  Frontal lobe and executive function deficit  Unsteadiness on feet    Problem List Patient Active Problem List   Diagnosis Date Noted  . Colitis 03/15/2020  . AMS (altered mental status) 10/05/2019  . Acute metabolic encephalopathy 70/26/3785  . Headache, unspecified 09/28/2019  . Abnormality of gait 09/13/2019  . Diabetes mellitus with end-stage renal disease ([place]) 08/07/2019  . Benign hypertensive kidney disease with chronic kidney disease 08/07/2019  . Hypothyroidism, unspecified 07/15/2019  . Spastic hemiparesis ([place]) 07/12/2019  . Dysphagia, oropharyngeal phase 07/05/2019  . Allergy, unspecified, initial encounter 06/27/2019  . PAF (paroxysmal atrial fibrillation) ([place])   . Bacteremia   . Labile blood pressure   . Labile blood glucose   . Controlled type 2 diabetes mellitus with hyperglycemia, without long-term current use of insulin ([place])   . ESRD on dialysis ([place])   . Right sided weakness   . SDH (subdural hematoma) ([place])   . Chronic intracranial subdural hematoma ([facility]) 06/09/2019  . Multiple myeloma not having achieved remission ([place]) 06/09/2019  . ESRD on hemodialysis ([place]) 06/09/2019  . Essential hypertension 06/09/2019  . DM type 2 (diabetes mellitus, type 2) ([place]) 06/09/2019  . Seizure disorder ([place]) 06/09/2019  . Aspiration pneumonia ([place]) 06/09/2019  . History of bacteremia 06/09/2019  . Atrial fibrillation, chronic ([place]) 06/09/2019  . Bilateral kidney stones 05/31/2019  . Proteus infection 05/28/2019  . History of restrictive pulmonary disease 04/07/2019  . Multiple myeloma ([place]) 05/17/2018  . Goals of care, counseling/discussion 05/17/2018  . Partial idiopathic epilepsy with seizures of localized onset, intractable, without status epilepticus ([place]) 11/02/2017  . Iron deficiency anemia, unspecified  01/22/2017  . COPD (chronic obstructive pulmonary disease) ([place]) 11/11/2016  . CVA (cerebral vascular accident) ([place]) 11/11/2016  . GERD (gastroesophageal reflux disease) 11/11/2016  . GI bleed 11/11/2016  . 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  . Obesity (BMI 30-39.9) 11/11/2016  . Pulmonary hypertension ([place]) 11/11/2016  . Patent foramen ovale 11/11/2016  . Hyperparathyroidism, secondary renal ([place]) 11/11/2016  . VTE (venous thromboembolism) 11/11/2016  . Hypotension 11/11/2016  . Systemic hypertension 11/11/2016  . Seizure ([place]) 11/11/2016  . Other seizures ([place]) 07/25/2016  . Encounter for removal of sutures 05/27/2016  . Ingrown nail 03/03/2016  . Onychomycosis due to dermatophyte 03/03/2016  . Other acquired hammer toe 03/03/2016  . Hemiparesis affecting right side as late effect of cerebrovascular accident ([place]) 02/28/2016  . Aphasia complicating stroke 88/50/2774  . Seizures ([place]) 01/08/2016  . History of ischemic left MCA stroke 01/08/2016  . Right sided weakness 01/08/2016  . Atrial fibrillation ([place]) 01/08/2016  . Leukocytosis 01/08/2016  . Benign essential HTN 01/08/2016  . Anemia of chronic disease 01/08/2016  . Controlled type 2 diabetes mellitus with diabetic nephropathy, without long-term current use of insulin ([place]) 01/08/2016  . History of DVT (deep vein thrombosis) 01/08/2016  . 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 Hypocalcemia 12/26/2015  . Long term (current) use of anticoagulants 11/07/2015  . Aftercare including intermittent dialysis ([place]) 11/02/2015  . Other specified coagulation defects ([place]) 11/02/2015  . Complication of vascular dialysis catheter 11/02/2015  . Diarrhea, unspecified 11/02/2015  . Fever, unspecified 11/02/2015  . Pruritus, unspecified 11/02/2015  . Shortness of breath 11/02/2015  . ESRD (end stage renal disease)  on dialysis [facility])     Zachery Conch MOT, OTR/L  08/30/2020, 12:46 PM  [facility] [address] [place], [place], [address] Phone: [phone]   Fax:  [phone]  Name: Trebor Galdamez [facility] MRN: [id number] Date of Birth: 25-May-1966

## 2020-08-30 NOTE — Therapy (Addendum)
[place] [address] [place], [place], [address] Phone: [phone]   Fax:  [phone]  Physical Therapy Treatment  Patient Details  Name: Isaiah [name] [facility] MRN: [id number] Date of Birth: 1965/12/05 Referring Provider (PT): Delice Lesch, MD   Encounter Date: 08/30/2020   PT End of Session - 08/30/20 1314    Visit Number 27    Number of Visits 34   per recert 09/98/3382   Date for PT Re-Evaluation 09/24/20    Authorization Type Medicare; KX starting at 07/27/2020 visit    Progress Note Due on Visit 30    PT Start Time 5053    PT Stop Time 1315    PT Time Calculation (min) 40 min    Equipment Utilized During Treatment Gait belt    Activity Tolerance Patient tolerated treatment well    Behavior During Therapy [facility] for tasks assessed/performed           Past Medical History:  Diagnosis Date  . A-fib ([place])   . Anemia   . Asthma   . DM type 2 (diabetes mellitus, type 2) ([place]) 06/09/2019  . ESRD (end stage renal disease) on dialysis ([place])   . Essential hypertension 06/09/2019  . GIB (gastrointestinal bleeding)    Recurrent episodes- 09/2014, 09/2015 and 05/2016  . Gout   . History of recent blood transfusion 10/27/14   2 Units PRBC's  . Hyperkalemia 07/2011  . Multiple myeloma ([place])   . OSA on CPAP   . Pulmonary embolism ([place]) 07/2011; 09/27/2014   a. Bilat PE 07/2011 - unclear cause, tx with 6 months Coumadin.;   . Seizure disorder ([place]) 06/09/2019  . Sepsis ([place])   . Sickle cell-thalassemia disease ([place])    a. Sickle cell trait.  . Sleep apnea   . Stroke ([place]) 09/2015   R-MCA, L-MCA, PCA and bilateral cerebellar complicated by DVT/PE  . Subdural hematoma ([place]) 05/2019    Past Surgical History:  Procedure Laterality Date  . AV FISTULA PLACEMENT Right 12/05/2015   Procedure: INSERTION OF ARTERIOVENOUS (AV) GORE-TEX GRAFT ARM;  Surgeon: Elam Dutch, MD;  Location: [place] [place];  Service: Vascular;  Laterality:  Right;  . BONE MARROW BIOPSY    . CATARACT EXTRACTION  08/2019  . CATARACT EXTRACTION  09/2019  . CHOLECYSTECTOMY    . CHOLECYSTECTOMY  1990's?  . COLONOSCOPY WITH PROPOFOL N/A 01/22/2017   Procedure: COLONOSCOPY WITH PROPOFOL;  Surgeon: Wilford Corner, MD;  Location: [facility];  Service: Endoscopy;  Laterality: N/A;  . EYE SURGERY Right   . INSERTION OF DIALYSIS CATHETER Right 10/16/2015   Procedure: INSERTION OF PALINDROME DIALYSIS CATHETER ;  Surgeon: Elam Dutch, MD;  Location: [place];  Service: Vascular;  Laterality: Right;  . OTHER SURGICAL HISTORY     Retinal surgery  . PARS PLANA VITRECTOMY  02/17/2012   Procedure: PARS PLANA VITRECTOMY WITH 25 GAUGE;  Surgeon: Hayden Pedro, MD;  Location: [place];  Service: Ophthalmology;  Laterality: Right;  . PERIPHERAL VASCULAR CATHETERIZATION N/A 03/20/2016   Procedure: A/V Shuntogram/Fistulagram;  Surgeon: Conrad Wildwood, MD;  Location: [place] CV [facility];  Service: Cardiovascular;  Laterality: N/A;  . TEE WITHOUT CARDIOVERSION N/A 12/02/2016   Procedure: TRANSESOPHAGEAL ECHOCARDIOGRAM (TEE);  Surgeon: Larey Dresser, MD;  Location: [place];  Service: Cardiovascular;  Laterality: N/A;    There were no vitals filed for this visit.   Subjective Assessment - 08/30/20 1234    Subjective No falls or complaints. Pt  saw PCP -- he states everything is good.    Pertinent History multiple myeloma, ESRD on dialysis, DM type II, DVT with IVC filter, CVA, COPD, a-fib; chemo treatments down to 1x/month (next is August 17th)    Patient Stated Goals To get back to walk without the walker, back to his baseline    Currently in Pain? No/denies                             [facility] Adult PT Treatment/Exercise - 08/30/20 0001      Ambulation/Gait   Ambulation/Gait Assistance 4: Min assist    Ambulation/Gait Assistance Details working on fast/slow and head turns; encouraged to maintain faster speed; assist for 1 major LOB with right  turn    Ambulation Distance (Feet) 115 Feet   x3 reps   Assistive device Rolling walker    Gait Pattern Step-through pattern;Trunk flexed;Poor foot clearance - left;Poor foot clearance - right    Ambulation Surface Level;Indoor           Standing in corner:  On foam with body turn x15 reps min A due to posterior lean  Forward tap on foam x10 bilat  Reaching forward for 3 cones x2 reps bilat  Half turns L and R x5 reps; min A with constant cueing for safety  Full turns L and R x2 reps; min A with constant cueing for safety        PT Short Term Goals - 08/23/20 1536      PT SHORT TERM GOAL #1   Title Pt will perform progression of  HEP independenlty for improved strength, balance and gait.  TARGET 08/24/2020    Baseline 08/23/20: met with current program, need to have program updated    Status Achieved      PT SHORT TERM GOAL #2   Title Pt will improve gait velocity to at least 2 ft/sec for improved gait efficiency and safety.    Baseline 08/23/20: 1.72 ft/sec with RW    Time --    Period --    Status Not Met    Target Date --      PT SHORT TERM GOAL #3   Title Pt will perform sit<>stand transfers 5 reps no LOB, with UE support, mod independently for improved safety and strength wtih transfrs.    Baseline 08/23/20: met in session with either UE support on walker or LE support on mat table.    Time --    Period --    Status Achieved             PT Long Term Goals - 07/27/20 1612      PT LONG TERM GOAL #1   Title Pt will be independent with final progression of HEP for improved strength, blaance, gait.  TARGET 09/07/2020    Time 6    Period Weeks    Status On-going      PT LONG TERM GOAL #2   Title Pt will imrpove Berg Balance score to at least 36/56 for decreased fall risk.    Baseline 30/56 05/31/20; 33/56 11/18    Time 6    Period Weeks    Status Revised      PT LONG TERM GOAL #3   Title Pt will improve TUG score to less than or equal to 30 seconds for  decreased fall risk.    Baseline 37 sec 05/31/20; 31.49 sec 07/26/2020    Time  6    Period Weeks    Status On-going      PT LONG TERM GOAL #4   Title Pt will verbalize understanding of plans for continued community fitness upon d/c from PT.    Time 6    Period Weeks    Status Revised      PT LONG TERM GOAL #5   Title Pt will ascend/descent 12 steps with both handrails with supervision only, to negotiate steps at home.    Baseline not attempted 05/31/20; wife reports getting stair lift at home    Time 6    Period Weeks    Status On-going               Plan - 08/30/20 1536    Clinical Impression Statement Today's session focused on emphasizing dynamic gait and balance. Continued to work on improving gait speed for TUG and weightshifting/turning. Pt encouraged to work on intervals of fast/slow walking at home; however, pt states he does not wish to work on this. Educated pt that it would benefit him to work on it for reduced fall risk and as good cardio exercise; pt continues to state he is not interested on practicing this at home. Pt continues to require cueing for safety and demos plateauing gains.    Personal Factors and Comorbidities Comorbidity 3+    Comorbidities PMH:  multiple myeloma, ESRD on dialysis, DM type II, DVT with IVC filter, CVA, COPD, a-fib    Examination-Activity Limitations Locomotion Level;Transfers;Stairs;Stand    Examination-Participation Restrictions Community Activity    Stability/Clinical Decision Making Evolving/Moderate complexity    Rehab Potential Good    PT Frequency 2x / week    PT Duration 6 weeks   per recert 85/27/7824   PT Treatment/Interventions ADLs/Self Care Home Management;DME Instruction;Neuromuscular re-education;Balance training;Therapeutic exercise;Therapeutic activities;Functional mobility training;Stair training;Gait training;Patient/family education    PT Next Visit Plan Check goals for d/c. Continue to work on gait with emphasis on  obstacle negotiation, endurance, LE strengthening and balance.      PT Home Exercise Plan Access Code [email or web address]    Consulted and Agree with Plan of Care Patient        Patient will benefit from skilled therapeutic intervention in order to improve the following deficits and impairments:     Visit Diagnosis: Other lack of coordination  Attention and concentration deficit  Muscle weakness (generalized)  Unsteadiness on feet  Other abnormalities of gait and mobility     Problem List Patient Active Problem List   Diagnosis Date Noted  . Colitis 03/15/2020  . AMS (altered mental status) 10/05/2019  . Acute metabolic encephalopathy 31/54/0086  . Headache, unspecified 09/28/2019  . Abnormality of gait 09/13/2019  . Diabetes mellitus with end-stage renal disease ([place]) 08/07/2019  . Benign hypertensive kidney disease with chronic kidney disease 08/07/2019  . Hypothyroidism, unspecified 07/15/2019  . Spastic hemiparesis ([place]) 07/12/2019  . Dysphagia, oropharyngeal phase 07/05/2019  . Allergy, unspecified, initial encounter 06/27/2019  . PAF (paroxysmal atrial fibrillation) ([place])   . Bacteremia   . Labile blood pressure   . Labile blood glucose   . Controlled type 2 diabetes mellitus with hyperglycemia, without long-term current use of insulin ([place])   . ESRD on dialysis ([place])   . Right sided weakness   . SDH (subdural hematoma) ([place])   . Chronic intracranial subdural hematoma (HCC) 06/09/2019  . Multiple myeloma not having achieved remission ([place]) 06/09/2019  . ESRD on hemodialysis ([place]) 06/09/2019  . Essential hypertension 06/09/2019  .  DM type 2 (diabetes mellitus, type 2) ([place]) 06/09/2019  . Seizure disorder ([place]) 06/09/2019  . Aspiration pneumonia ([place]) 06/09/2019  . History of bacteremia 06/09/2019  . Atrial fibrillation, chronic ([place]) 06/09/2019  . Bilateral kidney stones 05/31/2019  . Proteus infection 05/28/2019  . History of restrictive pulmonary disease  04/07/2019  . Multiple myeloma ([place]) 05/17/2018  . Goals of care, counseling/discussion 05/17/2018  . Partial idiopathic epilepsy with seizures of localized onset, intractable, without status epilepticus ([place]) 11/02/2017  . Iron deficiency anemia, unspecified 01/22/2017  . COPD (chronic obstructive pulmonary disease) ([place]) 11/11/2016  . CVA (cerebral vascular accident) ([place]) 11/11/2016  . GERD (gastroesophageal reflux disease) 11/11/2016  . GI bleed 11/11/2016  . 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  . Obesity (BMI 30-39.9) 11/11/2016  . Pulmonary hypertension ([place]) 11/11/2016  . Patent foramen ovale 11/11/2016  . Hyperparathyroidism, secondary renal ([place]) 11/11/2016  . VTE (venous thromboembolism) 11/11/2016  . Hypotension 11/11/2016  . Systemic hypertension 11/11/2016  . Seizure ([place]) 11/11/2016  . Other seizures ([place]) 07/25/2016  . Encounter for removal of sutures 05/27/2016  . Ingrown nail 03/03/2016  . Onychomycosis due to dermatophyte 03/03/2016  . Other acquired hammer toe 03/03/2016  . Hemiparesis affecting right side as late effect of cerebrovascular accident ([place]) 02/28/2016  . Aphasia complicating stroke 34/28/7681  . Seizures ([place]) 01/08/2016  . History of ischemic left MCA stroke 01/08/2016  . Right sided weakness 01/08/2016  . Atrial fibrillation ([place]) 01/08/2016  . Leukocytosis 01/08/2016  . Benign essential HTN 01/08/2016  . Anemia of chronic disease 01/08/2016  . Controlled type 2 diabetes mellitus with diabetic nephropathy, without long-term current use of insulin ([place]) 01/08/2016  . History of DVT (deep vein thrombosis) 01/08/2016  . 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 Hypocalcemia 12/26/2015  . Long term (current) use of anticoagulants 11/07/2015  . Aftercare including intermittent dialysis ([place]) 11/02/2015  . Other specified coagulation defects ([place]) 11/02/2015  . Complication of vascular dialysis catheter 11/02/2015  . Diarrhea, unspecified  11/02/2015  . Fever, unspecified 11/02/2015  . Pruritus, unspecified 11/02/2015  . Shortness of breath 11/02/2015  . ESRD (end stage renal disease) on dialysis [facility])     Javonna Balli April Ma L Gennaro Lizotte PT, DPT 08/30/2020, 2:44 PM  [place] [address] [place], [place], [address] Phone: [phone]   Fax:  [phone]  Name: Yuto Cajuste [facility] MRN: [id number] Date of Birth: 01-09-1966

## 2020-08-31 DIAGNOSIS — N2581 Secondary hyperparathyroidism of renal origin: Secondary | ICD-10-CM | POA: Diagnosis not present

## 2020-08-31 DIAGNOSIS — N186 End stage renal disease: Secondary | ICD-10-CM | POA: Diagnosis not present

## 2020-08-31 DIAGNOSIS — D631 Anemia in chronic kidney disease: Secondary | ICD-10-CM | POA: Diagnosis not present

## 2020-08-31 DIAGNOSIS — Z992 Dependence on renal dialysis: Secondary | ICD-10-CM | POA: Diagnosis not present

## 2020-08-31 DIAGNOSIS — E1129 Type 2 diabetes mellitus with other diabetic kidney complication: Secondary | ICD-10-CM | POA: Diagnosis not present

## 2020-09-03 ENCOUNTER — Telehealth: Payer: Self-pay | Admitting: *Deleted

## 2020-09-03 DIAGNOSIS — Z992 Dependence on renal dialysis: Secondary | ICD-10-CM | POA: Diagnosis not present

## 2020-09-03 DIAGNOSIS — E039 Hypothyroidism, unspecified: Secondary | ICD-10-CM | POA: Diagnosis not present

## 2020-09-03 DIAGNOSIS — N186 End stage renal disease: Secondary | ICD-10-CM | POA: Diagnosis not present

## 2020-09-03 DIAGNOSIS — E1065 Type 1 diabetes mellitus with hyperglycemia: Secondary | ICD-10-CM | POA: Diagnosis not present

## 2020-09-03 DIAGNOSIS — D631 Anemia in chronic kidney disease: Secondary | ICD-10-CM | POA: Diagnosis not present

## 2020-09-03 DIAGNOSIS — N2581 Secondary hyperparathyroidism of renal origin: Secondary | ICD-10-CM | POA: Diagnosis not present

## 2020-09-03 DIAGNOSIS — E1129 Type 2 diabetes mellitus with other diabetic kidney complication: Secondary | ICD-10-CM | POA: Diagnosis not present

## 2020-09-03 NOTE — Telephone Encounter (Signed)
Isaiah [name] states Isaiah [name] has been having hallucinations in the middle of the night and  mornings since ~ 08/18/20. They have seen Dr [name] for evaluation.  Isaiah [name] is wondering if daratumumab could cause hallucinations.

## 2020-09-04 ENCOUNTER — Ambulatory Visit: Payer: Medicare Other | Admitting: Physical Therapy

## 2020-09-04 ENCOUNTER — Ambulatory Visit: Payer: Medicare Other | Admitting: Occupational Therapy

## 2020-09-04 NOTE — Telephone Encounter (Signed)
Not likely. He is off treatment anyway.

## 2020-09-05 DIAGNOSIS — N2581 Secondary hyperparathyroidism of renal origin: Secondary | ICD-10-CM | POA: Diagnosis not present

## 2020-09-05 DIAGNOSIS — N186 End stage renal disease: Secondary | ICD-10-CM | POA: Diagnosis not present

## 2020-09-05 DIAGNOSIS — Z992 Dependence on renal dialysis: Secondary | ICD-10-CM | POA: Diagnosis not present

## 2020-09-05 DIAGNOSIS — D631 Anemia in chronic kidney disease: Secondary | ICD-10-CM | POA: Diagnosis not present

## 2020-09-05 DIAGNOSIS — E1129 Type 2 diabetes mellitus with other diabetic kidney complication: Secondary | ICD-10-CM | POA: Diagnosis not present

## 2020-09-06 ENCOUNTER — Encounter: Payer: Self-pay | Admitting: Physical Therapy

## 2020-09-06 ENCOUNTER — Ambulatory Visit: Payer: Medicare Other | Admitting: Physical Therapy

## 2020-09-06 ENCOUNTER — Encounter: Payer: Self-pay | Admitting: Occupational Therapy

## 2020-09-06 ENCOUNTER — Other Ambulatory Visit: Payer: Self-pay

## 2020-09-06 ENCOUNTER — Ambulatory Visit: Payer: Medicare Other | Admitting: Occupational Therapy

## 2020-09-06 DIAGNOSIS — R41842 Visuospatial deficit: Secondary | ICD-10-CM | POA: Diagnosis not present

## 2020-09-06 DIAGNOSIS — R278 Other lack of coordination: Secondary | ICD-10-CM

## 2020-09-06 DIAGNOSIS — R2681 Unsteadiness on feet: Secondary | ICD-10-CM | POA: Diagnosis not present

## 2020-09-06 DIAGNOSIS — R41844 Frontal lobe and executive function deficit: Secondary | ICD-10-CM

## 2020-09-06 DIAGNOSIS — M6281 Muscle weakness (generalized): Secondary | ICD-10-CM

## 2020-09-06 DIAGNOSIS — R2689 Other abnormalities of gait and mobility: Secondary | ICD-10-CM | POA: Diagnosis not present

## 2020-09-06 DIAGNOSIS — R41840 Attention and concentration deficit: Secondary | ICD-10-CM

## 2020-09-06 NOTE — Therapy (Signed)
[facility] [address] [place], [place], [address] Phone: [phone]   Fax:  [phone]  Physical Therapy Treatment  Patient Details  Name: Isaiah [name] [facility] MRN: [id number] Date of Birth: Jul 02, 1966 Referring Provider (PT): Delice Lesch, MD   Encounter Date: 09/06/2020   PT End of Session - 09/06/20 1237    Visit Number 28    Number of Visits 34   per recert 33/82/5053   Date for PT Re-Evaluation 09/24/20    Authorization Type Medicare; KX starting at 07/27/2020 visit    Progress Note Due on Visit 30    PT Start Time 1233    PT Stop Time 1315    PT Time Calculation (min) 42 min    Equipment Utilized During Treatment Gait belt    Activity Tolerance Patient tolerated treatment well    Behavior During Therapy [facility] for tasks assessed/performed           Past Medical History:  Diagnosis Date  . A-fib ([place])   . Anemia   . Asthma   . DM type 2 (diabetes mellitus, type 2) ([place]) 06/09/2019  . ESRD (end stage renal disease) on dialysis ([place])   . Essential hypertension 06/09/2019  . GIB (gastrointestinal bleeding)    Recurrent episodes- 09/2014, 09/2015 and 05/2016  . Gout   . History of recent blood transfusion 10/27/14   2 Units PRBC's  . Hyperkalemia 07/2011  . Multiple myeloma ([place])   . OSA on CPAP   . Pulmonary embolism ([place]) 07/2011; 09/27/2014   a. Bilat PE 07/2011 - unclear cause, tx with 6 months Coumadin.;   . Seizure disorder ([place]) 06/09/2019  . Sepsis ([place])   . Sickle cell-thalassemia disease ([place])    a. Sickle cell trait.  . Sleep apnea   . Stroke ([place]) 09/2015   R-MCA, L-MCA, PCA and bilateral cerebellar complicated by DVT/PE  . Subdural hematoma ([place]) 05/2019    Past Surgical History:  Procedure Laterality Date  . AV FISTULA PLACEMENT Right 12/05/2015   Procedure: INSERTION OF ARTERIOVENOUS (AV) GORE-TEX GRAFT ARM;  Surgeon: Elam Dutch, MD;  Location: [facility];  Service: Vascular;  Laterality:  Right;  . BONE MARROW BIOPSY    . CATARACT EXTRACTION  08/2019  . CATARACT EXTRACTION  09/2019  . CHOLECYSTECTOMY    . CHOLECYSTECTOMY  1990's?  . COLONOSCOPY WITH PROPOFOL N/A 01/22/2017   Procedure: COLONOSCOPY WITH PROPOFOL;  Surgeon: Wilford Corner, MD;  Location: [facility];  Service: Endoscopy;  Laterality: N/A;  . EYE SURGERY Right   . INSERTION OF DIALYSIS CATHETER Right 10/16/2015   Procedure: INSERTION OF PALINDROME DIALYSIS CATHETER ;  Surgeon: Elam Dutch, MD;  Location: [facility];  Service: Vascular;  Laterality: Right;  . OTHER SURGICAL HISTORY     Retinal surgery  . PARS PLANA VITRECTOMY  02/17/2012   Procedure: PARS PLANA VITRECTOMY WITH 25 GAUGE;  Surgeon: Hayden Pedro, MD;  Location: [facility];  Service: Ophthalmology;  Laterality: Right;  . PERIPHERAL VASCULAR CATHETERIZATION N/A 03/20/2016   Procedure: A/V Shuntogram/Fistulagram;  Surgeon: Conrad Maytown, MD;  Location: [facility];  Service: Cardiovascular;  Laterality: N/A;  . TEE WITHOUT CARDIOVERSION N/A 12/02/2016   Procedure: TRANSESOPHAGEAL ECHOCARDIOGRAM (TEE);  Surgeon: Larey Dresser, MD;  Location: [facility];  Service: Cardiovascular;  Laterality: N/A;    There were no vitals filed for this visit.   Subjective Assessment - 09/06/20 1236    Subjective No new complaints. Missed the  other day due to feeling run down after dialysis. No falls or pain to report.    Pertinent History multiple myeloma, ESRD on dialysis, DM type II, DVT with IVC filter, CVA, COPD, a-fib; chemo treatments down to 1x/month (next is August 17th)    Patient Stated Goals To get back to walk without the walker, back to his baseline    Currently in Pain? No/denies    Pain Score 0-No pain              [facility] PT Assessment - 09/06/20 1237      Berg Balance Test   Sit to Stand Able to stand  independently using hands    Standing Unsupported Able to stand 2 minutes with supervision    Sitting with Back Unsupported but Feet  Supported on Floor or Stool Able to sit safely and securely 2 minutes    Stand to Sit Controls descent by using hands    Transfers Able to transfer safely, definite need of hands    Standing Unsupported with Eyes Closed Able to stand 10 seconds safely    Standing Unsupported with Feet Together Able to place feet together independently and stand 1 minute safely    From Standing, Reach Forward with Outstretched Arm Can reach confidently >25 cm (10")    From Standing Position, Pick up Object from Floor Able to pick up shoe, needs supervision    From Standing Position, Turn to Look Behind Over each Shoulder Looks behind one side only/other side shows less weight shift   right> left   Turn 360 Degrees Able to turn 360 degrees safely but slowly    Standing Unsupported, Alternately Place Feet on Step/Stool Able to complete >2 steps/needs minimal assist   min HHA   Standing Unsupported, One Foot in Front Able to take small step independently and hold 30 seconds    Standing on One Leg Tries to lift leg/unable to hold 3 seconds but remains standing independently    Total Score 40    Berg comment: 37-45 significant risk for falls (>80%)      Timed Up and Go Test   TUG Normal TUG    Normal TUG (seconds) 24.45   with RW              [facility] Adult PT Treatment/Exercise - 09/06/20 1237      Transfers   Transfers Sit to Stand;Stand to Sit    Sit to Stand 5: Supervision;With upper extremity assist;From bed;From chair/3-in-1    Stand to Sit 5: Supervision;With upper extremity assist;To bed;To chair/3-in-1      Ambulation/Gait   Ambulation/Gait Yes    Ambulation/Gait Assistance 4: Min guard    Ambulation/Gait Assistance Details around gym with session/testing    Assistive device Rolling walker    Gait Pattern Step-through pattern;Trunk flexed;Poor foot clearance - left;Poor foot clearance - right    Ambulation Surface Level;Indoor    Stairs Yes    Stairs Assistance 4: Min guard    Stairs  Assistance Details (indicate cue type and reason) pt using reciprocal pattern to ascend and step to pattern to descend. Reports having a lift chair at home on steps. He uses it on his "tired" days, otherwise he reports he goes up/down the stairs with rails.    Stair Management Technique Two rails;Alternating pattern;Step to pattern;Forwards    Number of Stairs 4    Height of Stairs 6      Exercises   Exercises Other Exercises  Other Exercises  reviewed HEP. no issues per pt report. Discussed community fitness options. Pt reports plans to look into the [facility] as it is now free due to his age.                  PT Short Term Goals - 08/23/20 1536      PT SHORT TERM GOAL #1   Title Pt will perform progression of  HEP independenlty for improved strength, balance and gait.  TARGET 08/24/2020    Baseline 08/23/20: met with current program, need to have program updated    Status Achieved      PT SHORT TERM GOAL #2   Title Pt will improve gait velocity to at least 2 ft/sec for improved gait efficiency and safety.    Baseline 08/23/20: 1.72 ft/sec with RW    Time --    Period --    Status Not Met    Target Date --      PT SHORT TERM GOAL #3   Title Pt will perform sit<>stand transfers 5 reps no LOB, with UE support, mod independently for improved safety and strength wtih transfrs.    Baseline 08/23/20: met in session with either UE support on walker or LE support on mat table.    Time --    Period --    Status Achieved             PT Long Term Goals - 09/06/20 2215      PT LONG TERM GOAL #1   Title Pt will be independent with final progression of HEP for improved strength, blaance, gait.  TARGET 09/07/2020    Baseline 09/06/20; met in session today    Status Achieved      PT LONG TERM GOAL #2   Title Pt will imrpove Berg Balance score to at least 36/56 for decreased fall risk.    Baseline 09/06/20: 40/56 scored today    Status Achieved      PT LONG TERM  GOAL #3   Title Pt will improve TUG score to less than or equal to 30 seconds for decreased fall risk.    Baseline 09/06/20: 24.45 sec's with RW    Status Achieved      PT LONG TERM GOAL #4   Title Pt will verbalize understanding of plans for continued community fitness upon d/c from PT.    Baseline 09/06/20: pt plans to look into the [facility] for continued fitness    Status Achieved      PT LONG TERM GOAL #5   Title Pt will ascend/descent 12 steps with both handrails with supervision only, to negotiate steps at home.    Baseline 09/06/20: min guard for 4 steps with bil rails. pt reports having a stair lift at home he uses when is fatigued.    Status Partially Met                 Plan - 09/06/20 1237    Clinical Impression Statement Today's skilled session focused on progress toward LTGs with all goals met except for stair goal which was partially met. The pt is agreeable to discharge today.    Personal Factors and Comorbidities Comorbidity 3+    Comorbidities PMH:  multiple myeloma, ESRD on dialysis, DM type II, DVT with IVC filter, CVA, COPD, a-fib    Examination-Activity Limitations Locomotion Level;Transfers;Stairs;Stand    Examination-Participation Restrictions Community Activity    Stability/Clinical Decision Making Evolving/Moderate complexity  Rehab Potential Good    PT Frequency 2x / week    PT Duration 6 weeks   per recert 03/03/9484   PT Treatment/Interventions ADLs/Self Care Home Management;DME Instruction;Neuromuscular re-education;Balance training;Therapeutic exercise;Therapeutic activities;Functional mobility training;Stair training;Gait training;Patient/family education    PT Next Visit Plan discharge per PT plan of care    PT Home Exercise Plan Access Code [email or web address]    Consulted and Agree with Plan of Care Patient           Patient will benefit from skilled therapeutic intervention in order to improve the following deficits and impairments:   Abnormal gait,Difficulty walking,Decreased safety awareness,Decreased balance,Decreased mobility,Decreased strength  Visit Diagnosis: Other abnormalities of gait and mobility  Muscle weakness (generalized)  Unsteadiness on feet     Problem List Patient Active Problem List   Diagnosis Date Noted  . Colitis 03/15/2020  . AMS (altered mental status) 10/05/2019  . Acute metabolic encephalopathy 09/38/1829  . Headache, unspecified 09/28/2019  . Abnormality of gait 09/13/2019  . Diabetes mellitus with end-stage renal disease ([place]) 08/07/2019  . Benign hypertensive kidney disease with chronic kidney disease 08/07/2019  . Hypothyroidism, unspecified 07/15/2019  . Spastic hemiparesis ([place]) 07/12/2019  . Dysphagia, oropharyngeal phase 07/05/2019  . Allergy, unspecified, initial encounter 06/27/2019  . PAF (paroxysmal atrial fibrillation) ([place])   . Bacteremia   . Labile blood pressure   . Labile blood glucose   . Controlled type 2 diabetes mellitus with hyperglycemia, without long-term current use of insulin ([place])   . ESRD on dialysis ([place])   . Right sided weakness   . SDH (subdural hematoma) ([place])   . Chronic intracranial subdural hematoma ([facility]) 06/09/2019  . Multiple myeloma not having achieved remission ([place]) 06/09/2019  . ESRD on hemodialysis ([place]) 06/09/2019  . Essential hypertension 06/09/2019  . DM type 2 (diabetes mellitus, type 2) ([place]) 06/09/2019  . Seizure disorder ([place]) 06/09/2019  . Aspiration pneumonia ([place]) 06/09/2019  . History of bacteremia 06/09/2019  . Atrial fibrillation, chronic ([place]) 06/09/2019  . Bilateral kidney stones 05/31/2019  . Proteus infection 05/28/2019  . History of restrictive pulmonary disease 04/07/2019  . Multiple myeloma ([place]) 05/17/2018  . Goals of care, counseling/discussion 05/17/2018  . Partial idiopathic epilepsy with seizures of localized onset, intractable, without status epilepticus ([place]) 11/02/2017  . Iron deficiency anemia,  unspecified 01/22/2017  . COPD (chronic obstructive pulmonary disease) ([place]) 11/11/2016  . CVA (cerebral vascular accident) ([place]) 11/11/2016  . GERD (gastroesophageal reflux disease) 11/11/2016  . GI bleed 11/11/2016  . 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  . Obesity (BMI 30-39.9) 11/11/2016  . Pulmonary hypertension ([place]) 11/11/2016  . Patent foramen ovale 11/11/2016  . Hyperparathyroidism, secondary renal ([place]) 11/11/2016  . VTE (venous thromboembolism) 11/11/2016  . Hypotension 11/11/2016  . Systemic hypertension 11/11/2016  . Seizure ([place]) 11/11/2016  . Other seizures ([place]) 07/25/2016  . Encounter for removal of sutures 05/27/2016  . Ingrown nail 03/03/2016  . Onychomycosis due to dermatophyte 03/03/2016  . Other acquired hammer toe 03/03/2016  . Hemiparesis affecting right side as late effect of cerebrovascular accident ([place]) 02/28/2016  . Aphasia complicating stroke 93/71/6967  . Seizures ([place]) 01/08/2016  . History of ischemic left MCA stroke 01/08/2016  . Right sided weakness 01/08/2016  . Atrial fibrillation ([place]) 01/08/2016  . Leukocytosis 01/08/2016  . Benign essential HTN 01/08/2016  . Anemia of chronic disease 01/08/2016  . Controlled type 2 diabetes mellitus with diabetic nephropathy, without long-term current use of insulin ([place]) 01/08/2016  . History of DVT (  deep vein thrombosis) 01/08/2016  . 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 Hypocalcemia 12/26/2015  . Long term (current) use of anticoagulants 11/07/2015  . Aftercare including intermittent dialysis ([place]) 11/02/2015  . Other specified coagulation defects ([place]) 11/02/2015  . Complication of vascular dialysis catheter 11/02/2015  . Diarrhea, unspecified 11/02/2015  . Fever, unspecified 11/02/2015  . Pruritus, unspecified 11/02/2015  . Shortness of breath 11/02/2015  . ESRD (end stage renal disease) on dialysis ([place])     Willow Ora, PTA, [facility] [address]  [place],  [address] [phone] 09/06/20, 10:22 PM   Name: Isaiah [name] MRN: [id number] Date of Birth: 1965-10-06

## 2020-09-06 NOTE — Therapy (Signed)
[name] [address] [place], [place], [address] Phone: [phone]   Fax:  [phone]  Occupational Therapy Treatment & Discharge  Patient Details  Name: Isaiah [name] [facility] MRN: [id number] Date of Birth: [birth year] Referring Provider (OT): Posey Pronto, Ankit A.   Encounter Date: 09/06/2020   OT End of Session - 09/06/20 1316    Visit Number 16    Number of Visits 20    Date for OT Re-Evaluation 09/04/20    Authorization Type Medicare    Authorization Time Period WEEK 4/4    Authorization - Visit Number 3    Authorization - Number of Visits 20    Progress Note Due on Visit 20    OT Start Time 1316    OT Stop Time 1340    OT Time Calculation (min) 24 min    Activity Tolerance Patient tolerated treatment well    Behavior During Therapy WFL for tasks assessed/performed           Past Medical History:  Diagnosis Date  . A-fib ([place])   . Anemia   . Asthma   . DM type 2 (diabetes mellitus, type 2) ([place]) 06/09/2019  . ESRD (end stage renal disease) on dialysis ([place])   . Essential hypertension 06/09/2019  . GIB (gastrointestinal bleeding)    Recurrent episodes- 09/2014, 09/2015 and 05/2016  . Gout   . History of recent blood transfusion 10/27/14   2 Units PRBC's  . Hyperkalemia 07/2011  . Multiple myeloma ([place])   . OSA on CPAP   . Pulmonary embolism ([place]) 07/2011; 09/27/2014   a. Bilat PE 07/2011 - unclear cause, tx with 6 months Coumadin.;   . Seizure disorder ([place]) 06/09/2019  . Sepsis ([place])   . Sickle cell-thalassemia disease ([place])    a. Sickle cell trait.  . Sleep apnea   . Stroke ([place]) 09/2015   R-MCA, L-MCA, PCA and bilateral cerebellar complicated by DVT/PE  . Subdural hematoma ([place]) 05/2019    Past Surgical History:  Procedure Laterality Date  . AV FISTULA PLACEMENT Right 12/05/2015   Procedure: INSERTION OF ARTERIOVENOUS (AV) GORE-TEX GRAFT ARM;  Surgeon: Elam Dutch, MD;  Location: [facility];   Service: Vascular;  Laterality: Right;  . BONE MARROW BIOPSY    . CATARACT EXTRACTION  08/2019  . CATARACT EXTRACTION  09/2019  . CHOLECYSTECTOMY    . CHOLECYSTECTOMY  1990's?  . COLONOSCOPY WITH PROPOFOL N/A 01/22/2017   Procedure: COLONOSCOPY WITH PROPOFOL;  Surgeon: Wilford Corner, MD;  Location: [facility];  Service: Endoscopy;  Laterality: N/A;  . EYE SURGERY Right   . INSERTION OF DIALYSIS CATHETER Right 10/16/2015   Procedure: INSERTION OF PALINDROME DIALYSIS CATHETER ;  Surgeon: Elam Dutch, MD;  Location: [facility];  Service: Vascular;  Laterality: Right;  . OTHER SURGICAL HISTORY     Retinal surgery  . PARS PLANA VITRECTOMY  02/17/2012   Procedure: PARS PLANA VITRECTOMY WITH 25 GAUGE;  Surgeon: Hayden Pedro, MD;  Location: [facility];  Service: Ophthalmology;  Laterality: Right;  . PERIPHERAL VASCULAR CATHETERIZATION N/A 03/20/2016   Procedure: A/V Shuntogram/Fistulagram;  Surgeon: Conrad Schwen[place], MD;  Location: [facility];  Service: Cardiovascular;  Laterality: N/A;  . TEE WITHOUT CARDIOVERSION N/A 12/02/2016   Procedure: TRANSESOPHAGEAL ECHOCARDIOGRAM (TEE);  Surgeon: Larey Dresser, MD;  Location: [facility];  Service: Cardiovascular;  Laterality: N/A;    There were no vitals filed for this visit.   Subjective Assessment - 09/06/20 1325  Subjective  Pt denies any pain. Pt says he's ready for discharge "my hand is better, I can do this and write my name"    Pertinent History multiple myeloma (currently undergoing chemo treatment), DM, CVA, DVT, seizure disorder, SDH, ESRD on dialysis, and history of SDH x 2    Limitations Fall. No Driving. RUE fistula-dialysis; hx of seizures    Patient Stated Goals write better with his right hand, pick things up better with R hand                OCCUPATIONAL THERAPY DISCHARGE SUMMARY  Visits from Start of Care: 16  Current functional level related to goals / functional outcomes: Pt made slow progress towards goals and  has met all STGs and all but 1 LTG. Pt has improved with fine motor coordination and ability to complete things like tying his drawstring on pants with use of RUE and increased functional use of RUE. Pt continues o have visual deficits that impede progress and overall independence.    Remaining deficits: Continues to have unsteadiness on feet. Visual deficits.    Education / Equipment: HEPs   Plan: Patient agrees to discharge.  Patient goals were partially met. Patient is being discharged due to meeting the stated rehab goals.  ?????                        OT Short Term Goals - 09/06/20 1317      OT SHORT TERM GOAL #1   Title Patient and caregiver will be independent with HEP 07/12/20    Time 4    Period Weeks    Status Achieved    Target Date 07/12/20      OT SHORT TERM GOAL #2   Title Patient will perform UB dressing with min A in order to increase independence with ADLs    Time 4    Period Weeks    Status Achieved      OT SHORT TERM GOAL #3   Title Patient will demonstrate improved function use of RUE as evidenced by increasing Box and Blocks score to 12 blocks or more.    Baseline RUE 9    Time 4    Period Weeks    Status Achieved   RUE 12     OT SHORT TERM GOAL #4   Title Pt will improve RUE grip strength by at least 3 lbs or more in order to manage clothing with more independence with toileting    Baseline 22.4    Time 4    Period Weeks    Status Achieved   25.7     OT SHORT TERM GOAL #5   Title Pt will perform all aspects of toileting with min A consistently    Time 4    Period Weeks    Status Achieved   unable to get an accurate answer as family not present for session this day but pt reports he is taking himself to the toilet independently     [place] #6   Title Pt and spouse will report patient utilizing RUE for functional tasks 15% of the time in daily activities.    Time 4    Period Weeks    Status Achieved              OT Long Term Goals - 09/06/20 1317      OT LONG TERM GOAL #1   Title Pt will be independent  with updated HEP 08/09/2020    Time 8    Period Weeks    Status Achieved      OT LONG TERM GOAL #2   Title Pt will improve RUE shoulder flexion to 110 degrees in order to obtain lightweight object from overhead cabinet.    Time 8    Period Weeks    Status Achieved   130 degrees     OT LONG TERM GOAL #3   Title Patient will demonstrate improved function use of RUE as evidenced by increasing Box and Blocks score to 17 blocks or more.    Time 8    Period Weeks    Status Achieved   18     OT LONG TERM GOAL #4   Title Patient will perform UB dressing with supervision with verbal cues in order to increase indepedence with ADLs.    Time 8    Period Weeks    Status Achieved      OT LONG TERM GOAL #5   Title Pt will improve RUE grip strength by at least 6 lbs in order to manage clothing with more independence with toileting    Baseline 22.4 RUE    Time 8    Period Weeks    Status Not Met   22.9 lbs at discharge     OT LONG TERM GOAL #6   Title Pt will perform all aspects of toileting with distant supervision for safety in order to increase independence with ADLs.    Time 8    Period Weeks    Status Achieved   pt reports independence with toileting     OT LONG TERM GOAL #7   Title Pt and spouse will report patient utilizing RUE for functional tasks 25% of the time in daily activities.    Time 8    Period Weeks    Status Achieved                 Plan - 09/06/20 1333    Clinical Impression Statement Pt made slow progress towards goals and has met all STGs and all but 1 LTG. Pt has improved with fine motor coordination and ability to complete things like tying his drawstring on pants with use of RUE and increased functional use of RUE. Pt continues o have visual deficits that impede progress and overall independence.    OT Occupational Profile and History Detailed Assessment- Review  of Records and additional review of physical, cognitive, psychosocial history related to current functional performance    Occupational performance deficits (Please refer to evaluation for details): IADL's;ADL's;Leisure;Social Participation    Body Structure / Function / Physical Skills ADL;Decreased knowledge of use of DME;Gait;Strength;Tone;GMC;Dexterity;Balance;Body mechanics;Proprioception;UE functional use;ROM;IADL;Coordination;FMC;Vision;Flexibility;Mobility    Cognitive Skills Attention;Problem Solve;Safety Awareness;Sequencing;Energy/Drive;Thought;Learn;Memory;Understand;Perception    Rehab Potential Good    Clinical Decision Making Limited treatment options, no task modification necessary    Comorbidities Affecting Occupational Performance: May have comorbidities impacting occupational performance    Modification or Assistance to Complete Evaluation  No modification of tasks or assist necessary to complete eval    OT Frequency 2x / week    OT Duration 8 weeks    OT Treatment/Interventions Self-care/ADL training;Moist Heat;Fluidtherapy;DME and/or AE instruction;Balance training;Gait Training;Therapeutic activities;Cognitive remediation/compensation;Therapeutic exercise;Ultrasound;Neuromuscular education;Passive range of motion;Visual/perceptual remediation/compensation;Functional Mobility Training;Electrical Stimulation;Energy conservation;Manual Therapy;Patient/family education    Plan safety with ambulation and visual scanning, ADL strategies, RUE functional use    Recommended Other Services Recommended ST referral - spouse asked to wait.  Consulted and Agree with Plan of Care Patient           Patient will benefit from skilled therapeutic intervention in order to improve the following deficits and impairments:   Body Structure / Function / Physical Skills: ADL,Decreased knowledge of use of DME,Gait,Strength,Tone,GMC,Dexterity,Balance,Body mechanics,Proprioception,UE functional  use,ROM,IADL,Coordination,FMC,Vision,Flexibility,Mobility Cognitive Skills: Attention,Problem Solve,Safety Awareness,Sequencing,Energy/Drive,Thought,Learn,Memory,Understand,Perception     Visit Diagnosis: Other lack of coordination  Attention and concentration deficit  Frontal lobe and executive function deficit  Unsteadiness on feet  Other abnormalities of gait and mobility  Visuospatial deficit  Muscle weakness (generalized)    Problem List Patient Active Problem List   Diagnosis Date Noted  . Colitis 03/15/2020  . AMS (altered mental status) 10/05/2019  . Acute metabolic encephalopathy 07/86/7544  . Headache, unspecified 09/28/2019  . Abnormality of gait 09/13/2019  . Diabetes mellitus with end-stage renal disease ([place]) 08/07/2019  . Benign hypertensive kidney disease with chronic kidney disease 08/07/2019  . Hypothyroidism, unspecified 07/15/2019  . Spastic hemiparesis ([place]) 07/12/2019  . Dysphagia, oropharyngeal phase 07/05/2019  . Allergy, unspecified, initial encounter 06/27/2019  . PAF (paroxysmal atrial fibrillation) ([place])   . Bacteremia   . Labile blood pressure   . Labile blood glucose   . Controlled type 2 diabetes mellitus with hyperglycemia, without long-term current use of insulin ([place])   . ESRD on dialysis ([place])   . Right sided weakness   . SDH (subdural hematoma) ([place])   . Chronic intracranial subdural hematoma ([facility]) 06/09/2019  . Multiple myeloma not having achieved remission ([place]) 06/09/2019  . ESRD on hemodialysis ([place]) 06/09/2019  . Essential hypertension 06/09/2019  . DM type 2 (diabetes mellitus, type 2) ([place]) 06/09/2019  . Seizure disorder ([place]) 06/09/2019  . Aspiration pneumonia ([place]) 06/09/2019  . History of bacteremia 06/09/2019  . Atrial fibrillation, chronic ([place]) 06/09/2019  . Bilateral kidney stones 05/31/2019  . Proteus infection 05/28/2019  . History of restrictive pulmonary disease 04/07/2019  . Multiple myeloma ([place])  05/17/2018  . Goals of care, counseling/discussion 05/17/2018  . Partial idiopathic epilepsy with seizures of localized onset, intractable, without status epilepticus ([place]) 11/02/2017  . Iron deficiency anemia, unspecified 01/22/2017  . COPD (chronic obstructive pulmonary disease) ([place]) 11/11/2016  . CVA (cerebral vascular accident) ([place]) 11/11/2016  . GERD (gastroesophageal reflux disease) 11/11/2016  . GI bleed 11/11/2016  . 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  . Obesity (BMI 30-39.9) 11/11/2016  . Pulmonary hypertension ([place]) 11/11/2016  . Patent foramen ovale 11/11/2016  . Hyperparathyroidism, secondary renal ([place]) 11/11/2016  . VTE (venous thromboembolism) 11/11/2016  . Hypotension 11/11/2016  . Systemic hypertension 11/11/2016  . Seizure ([place]) 11/11/2016  . Other seizures ([place]) 07/25/2016  . Encounter for removal of sutures 05/27/2016  . Ingrown nail 03/03/2016  . Onychomycosis due to dermatophyte 03/03/2016  . Other acquired hammer toe 03/03/2016  . Hemiparesis affecting right side as late effect of cerebrovascular accident ([place]) 02/28/2016  . Aphasia complicating stroke 92/09/69  . Seizures ([place]) 01/08/2016  . History of ischemic left MCA stroke 01/08/2016  . Right sided weakness 01/08/2016  . Atrial fibrillation ([place]) 01/08/2016  . Leukocytosis 01/08/2016  . Benign essential HTN 01/08/2016  . Anemia of chronic disease 01/08/2016  . Controlled type 2 diabetes mellitus with diabetic nephropathy, without long-term current use of insulin ([place]) 01/08/2016  . History of DVT (deep vein thrombosis) 01/08/2016  . 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 Hypocalcemia 12/26/2015  . Long term (current) use of anticoagulants 11/07/2015  . Aftercare including intermittent dialysis ([place]) 11/02/2015  .  Other specified coagulation defects ([place]) 11/02/2015  . Complication of vascular dialysis catheter 11/02/2015  . Diarrhea, unspecified 11/02/2015  . Fever, unspecified  11/02/2015  . Pruritus, unspecified 11/02/2015  . Shortness of breath 11/02/2015  . ESRD (end stage renal disease) on dialysis [name])     Zachery Conch MOT, OTR/L  09/06/2020, 1:36 PM  [facility] [address] [place], [place], [address] Phone: [phone]   Fax:  [phone]  Name: Norberto Wishon [facility] MRN: [id number] Date of Birth: [birth year]

## 2020-09-07 DIAGNOSIS — Z992 Dependence on renal dialysis: Secondary | ICD-10-CM | POA: Diagnosis not present

## 2020-09-07 DIAGNOSIS — N186 End stage renal disease: Secondary | ICD-10-CM | POA: Diagnosis not present

## 2020-09-07 DIAGNOSIS — E1129 Type 2 diabetes mellitus with other diabetic kidney complication: Secondary | ICD-10-CM | POA: Diagnosis not present

## 2020-09-07 DIAGNOSIS — N2581 Secondary hyperparathyroidism of renal origin: Secondary | ICD-10-CM | POA: Diagnosis not present

## 2020-09-07 DIAGNOSIS — D631 Anemia in chronic kidney disease: Secondary | ICD-10-CM | POA: Diagnosis not present

## 2020-09-07 NOTE — Progress Notes (Shared)
[facility] Note  09/11/2020     CHIEF COMPLAINT Patient presents for No chief complaint on file.   HISTORY OF PRESENT ILLNESS: Isaiah [name] is a 54 y.o. male who presents to the clinic today for:     Referring physician: [name], MD [address] [place],  [place] [address]  HISTORICAL INFORMATION:   Selected notes from the [facility] of Dr. [name].  Urgent re-referral from Dr. [name] for possible PDR w/NVI OD.  Presented to [facility] on 7.27.21 w/elevated IOPS and OD swelling and irritation.  Started on Brimonidine BID OD and Pred Q1hr OD.   CURRENT MEDICATIONS: No current outpatient medications on file. (Ophthalmic Drugs)   No current facility-administered medications for this visit. (Ophthalmic Drugs)   Current Outpatient Medications (Other)  Medication Sig  . acetaminophen (TYLENOL) 325 MG tablet Take 1-2 tablets (325-650 mg total) by mouth every 4 (four) hours as needed for mild pain.  [name] acyclovir (ZOVIRAX) 400 MG tablet TAKE 1 TABLET BY MOUTH ONCE DAILY  . albuterol (PROAIR HFA) 108 (90 Base) MCG/ACT inhaler Inhale two puffs every 4-6 hours if needed for cough or wheeze (Patient taking differently: Inhale 2 puffs into the lungs every 4 (four) hours as needed for wheezing or shortness of breath. Inhale two puffs every 4-6 hours if needed for cough or wheeze)  . Alcohol Swabs (SM ALCOHOL PREP) 70 % PADS   . atorvastatin (LIPITOR) 20 MG tablet TAKE 1 TABLET BY MOUTH ONCE A DAY  . AURYXIA 1 GM 210 MG(Fe) tablet Take 210 mg by mouth 3 (three) times daily.  [name] BREO ELLIPTA 200-25 MCG/INH AEPB INHALE 1 PUFF INTO THE LUNGS AT THE SAME TIME EVERY DAY  . calcium acetate (PHOSLO) 667 MG capsule Take by mouth.  . camphor-menthol (SARNA) lotion Apply 1 application topically every 8 (eight) hours as needed for itching.  . Cetirizine HCl 10 MG CAPS Take 1 capsule by mouth daily.   . Cholecalciferol (D3-1000 PO) Take  by mouth.  . Cinacalcet HCl (SENSIPAR PO) Take by mouth.  (Patient not taking: Reported on 07/19/2020)  . clonazePAM (KLONOPIN) 0.5 MG tablet Take 2 tablets every Monday, Wednesday, and Friday with hemodialysis  . FREESTYLE LITE test strip   . hydrOXYzine (ATARAX/VISTARIL) 25 MG tablet TAKE 1 TABLET (25 MG TOTAL) BY MOUTH EVERY 8 (EIGHT) HOURS AS NEEDED FOR ITCHING.  . Insulin Glargine (BASAGLAR KWIKPEN) 100 UNIT/ML Inject 8 Units into the skin daily.  . Insulin Lispro (HUMALOG KWIKPEN [place]) Inject 2-4 Units into the skin 3 (three) times daily before meals. Sliding scale: 150-250 =2 units, 250 -350=3 units, 350 += 4 units  . lacosamide (VIMPAT) 200 MG TABS tablet Take 1 tablet twice a day except on dialysis days M-W-F give 1 tab in AM, 1 tab after dialysis, 1 tab in PM.  . lamoTRIgine (LAMICTAL) 200 MG tablet Take 1 tablet (200 mg total) by mouth 2 (two) times daily.  . Lancets (FREESTYLE) lancets   . metoprolol tartrate (LOPRESSOR) 25 MG tablet Take 25 mg by mouth daily.  . midodrine (PROAMATINE) 10 MG tablet TAKE 1 TABLET BY MOUTH TWICE DAILY WITH MEALS  . multivitamin (RENA-VIT) TABS tablet Take 1 tablet by mouth at bedtime.  . Nutritional Supplements (FEEDING SUPPLEMENT, NEPRO CARB STEADY,) LIQD Take 237 mLs by mouth 3 (three) times daily as needed (Supplement).  . pantoprazole (PROTONIX) 40 MG tablet TAKE 1 TABLET BY MOUTH ONCE DAILY  .  prochlorperazine (COMPAZINE) 10 MG tablet TAKE 1 TABLET (10 MG TOTAL) BY MOUTH EVERY 6 (SIX) HOURS AS NEEDED FOR NAUSEA OR VOMITING.  [name] UNIFINE PENTIPS 31G X 8 MM MISC USE AS DIRECTED DAILY AFTER SUPPER   No current facility-administered medications for this visit. (Other)   REVIEW OF SYSTEMS:  ALLERGIES Allergies  Allergen Reactions  . Codeine Swelling  . Codeine Rash and Other (See Comments)    Unknown reaction (patient says it was more serious than just a rash, but he can't remember what happened)    PAST MEDICAL HISTORY Past Medical History:   Diagnosis Date  . A-fib ([place])   . Anemia   . Asthma   . DM type 2 (diabetes mellitus, type 2) ([place]) 06/09/2019  . ESRD (end stage renal disease) on dialysis ([place])   . Essential hypertension 06/09/2019  . GIB (gastrointestinal bleeding)    Recurrent episodes- 09/2014, 09/2015 and 05/2016  . Gout   . History of recent blood transfusion 10/27/14   2 Units PRBC's  . Hyperkalemia 07/2011  . Multiple myeloma ([place])   . OSA on CPAP   . Pulmonary embolism ([place]) 07/2011; 09/27/2014   a. Bilat PE 07/2011 - unclear cause, tx with 6 months Coumadin.;   . Seizure disorder ([place]) 06/09/2019  . Sepsis ([place])   . Sickle cell-thalassemia disease ([place])    a. Sickle cell trait.  . Sleep apnea   . Stroke ([place]) 09/2015   R-MCA, L-MCA, PCA and bilateral cerebellar complicated by DVT/PE  . Subdural hematoma ([place]) 05/2019   Past Surgical History:  Procedure Laterality Date  . AV FISTULA PLACEMENT Right 12/05/2015   Procedure: INSERTION OF ARTERIOVENOUS (AV) GORE-TEX GRAFT ARM;  Surgeon: Elam Dutch, MD;  Location: [facility];  Service: Vascular;  Laterality: Right;  . BONE MARROW BIOPSY    . CATARACT EXTRACTION  08/2019  . CATARACT EXTRACTION  09/2019  . CHOLECYSTECTOMY    . CHOLECYSTECTOMY  1990's?  . COLONOSCOPY WITH PROPOFOL N/A 01/22/2017   Procedure: COLONOSCOPY WITH PROPOFOL;  Surgeon: Wilford Corner, MD;  Location: [facility];  Service: Endoscopy;  Laterality: N/A;  . EYE SURGERY Right   . INSERTION OF DIALYSIS CATHETER Right 10/16/2015   Procedure: INSERTION OF PALINDROME DIALYSIS CATHETER ;  Surgeon: Elam Dutch, MD;  Location: [facility];  Service: Vascular;  Laterality: Right;  . OTHER SURGICAL HISTORY     Retinal surgery  . PARS PLANA VITRECTOMY  02/17/2012   Procedure: PARS PLANA VITRECTOMY WITH 25 GAUGE;  Surgeon: Hayden Pedro, MD;  Location: [facility];  Service: Ophthalmology;  Laterality: Right;  . PERIPHERAL VASCULAR CATHETERIZATION N/A 03/20/2016   Procedure: A/V  Shuntogram/Fistulagram;  Surgeon: Conrad Union, MD;  Location: [facility];  Service: Cardiovascular;  Laterality: N/A;  . TEE WITHOUT CARDIOVERSION N/A 12/02/2016   Procedure: TRANSESOPHAGEAL ECHOCARDIOGRAM (TEE);  Surgeon: Larey Dresser, MD;  Location[facility];  Service: Cardiovascular;  Laterality: N/A;   FAMILY HISTORY Family History  Problem Relation Age of Onset  . Hypertension Mother   . Hypertension Father   . Stroke Father   . Sickle cell anemia Brother   . Diabetes Mother   . Sickle cell anemia Brother   . Diabetes type I Brother   . Kidney disease Brother    SOCIAL HISTORY Social History   Tobacco Use  . Smoking status: Never Smoker  . Smokeless tobacco: Never Used  Vaping Use  . Vaping Use: Never used  Substance Use Topics  .  Alcohol use: Not Currently  . Drug use: Never         OPHTHALMIC EXAM:  Not recorded     IMAGING AND PROCEDURES  Imaging and Procedures for 09/11/2020           ASSESSMENT/PLAN:  No diagnosis found. 1,2. VH & microyphema OD secondary to PDR -- stably resolved  - s/p IVA OD #1 (07.29.21)  - VH and microhyphema stably resolved OD -- back to baseline (BCVA 20/200 OD)  - NVI OD regressed      - PF tapered off      - cont Cosopt BID OD  - VH precautions reviewed -- minimize activities, keep head elevated, avoid ASA/NSAIDs/blood thinners as able  - F/u in 3 months w/ DFE/OCT  3,4. Proliferative diabetic retinopathy w/ retinal ischemia, no DME, OU (OD > OS)  - pt of Dr. [name], last visit 03.04.21  - history of PPV w/ endolaser, FAX OD 6.11.13; PRP OS 2020  - s/p IVA OD #1 (07.29.21) as above for new VH and microhyphema  - s/p PRP OD (08.24.21)  - exam shows NVI regressed and VH improved OD; OS with good 360 PRP and +asteroid hyalosis  - OCT without diabetic macular edema, but diffuse atrophy, both eyes   - FA (08.12.21) shows severe vascular perfusion OD -- peripheral nonperfusion extends posteriorly just  outside arcades   - F/u in 3 months w/DFE/OCT  5. History of Sickle Cell Retinopathy  - 360 PRP in place OU  - no active NV, confirmed on FA 8.12.21  6,7. Hypertensive retinopathy OU - discussed importance of tight BP control - monitor  8. Pseudophakia OU  - s/p CE/IOL OU (OD Jan 2021; OS Feb 2021  - IOL in good position, doing well  - monitor  9. Dry eyes OU  - large component of BCVA - recommend artificial tears and lubricating ointment as needed  Ophthalmic Meds Ordered this visit:  No orders of the defined types were placed in this encounter.     No follow-ups on file.  There are no Patient Instructions on file for this visit.  Explained the diagnoses, plan, and follow up with the patient and they expressed understanding.  Patient expressed understanding of the importance of proper follow up care.    Abbreviations: M myopia (nearsighted); A astigmatism; H hyperopia (farsighted); P presbyopia; Mrx spectacle prescription;  CTL contact lenses; OD right eye; OS left eye; OU both eyes  XT exotropia; ET esotropia; PEK punctate epithelial keratitis; PEE punctate epithelial erosions; DES dry eye syndrome; MGD meibomian gland dysfunction; ATs artificial tears; PFAT's preservative free artificial tears; [place] nuclear sclerotic cataract; PSC posterior subcapsular cataract; ERM epi-retinal membrane; PVD posterior vitreous detachment; RD retinal detachment; DM diabetes mellitus; DR diabetic retinopathy; NPDR non-proliferative diabetic retinopathy; PDR proliferative diabetic retinopathy; CSME clinically significant macular edema; DME diabetic macular edema; dbh dot blot hemorrhages; CWS cotton wool spot; POAG primary open angle glaucoma; C/D cup-to-disc ratio; HVF humphrey visual field; GVF goldmann visual field; OCT optical coherence tomography; IOP intraocular pressure; BRVO Branch retinal vein occlusion; CRVO central retinal vein occlusion; CRAO central retinal artery occlusion; BRAO branch  retinal artery occlusion; RT retinal tear; SB scleral buckle; PPV pars plana vitrectomy; VH Vitreous hemorrhage; PRP panretinal laser photocoagulation; IVK intravitreal kenalog; VMT vitreomacular traction; MH Macular hole;  NVD neovascularization of the disc; NVE neovascularization elsewhere; AREDS age related eye disease study; ARMD age related macular degeneration; POAG primary open angle glaucoma; EBMD epithelial/anterior basement membrane dystrophy; ACIOL  anterior chamber intraocular lens; IOL intraocular lens; PCIOL posterior chamber intraocular lens; Phaco/IOL phacoemulsification with intraocular lens placement; [place] photorefractive keratectomy; LASIK laser assisted in situ keratomileusis; HTN hypertension; DM diabetes mellitus; COPD chronic obstructive pulmonary disease

## 2020-09-08 DIAGNOSIS — E1122 Type 2 diabetes mellitus with diabetic chronic kidney disease: Secondary | ICD-10-CM | POA: Diagnosis not present

## 2020-09-08 DIAGNOSIS — Z992 Dependence on renal dialysis: Secondary | ICD-10-CM | POA: Diagnosis not present

## 2020-09-08 DIAGNOSIS — N186 End stage renal disease: Secondary | ICD-10-CM | POA: Diagnosis not present

## 2020-09-09 MED FILL — SUBVENITE 200 MG TABS: 200 | 30 days supply | Qty: 60 | Fill #11

## 2020-09-10 ENCOUNTER — Telehealth: Payer: Medicare Other

## 2020-09-10 ENCOUNTER — Other Ambulatory Visit: Payer: Self-pay | Admitting: Internal Medicine

## 2020-09-10 DIAGNOSIS — Z992 Dependence on renal dialysis: Secondary | ICD-10-CM | POA: Diagnosis not present

## 2020-09-10 DIAGNOSIS — N186 End stage renal disease: Secondary | ICD-10-CM | POA: Diagnosis not present

## 2020-09-10 DIAGNOSIS — N2581 Secondary hyperparathyroidism of renal origin: Secondary | ICD-10-CM | POA: Diagnosis not present

## 2020-09-10 DIAGNOSIS — E1129 Type 2 diabetes mellitus with other diabetic kidney complication: Secondary | ICD-10-CM | POA: Diagnosis not present

## 2020-09-10 MED FILL — MIDODRINE HCL 10 MG TABS: 10 | 30 days supply | Qty: 60 | Fill #0

## 2020-09-10 NOTE — Telephone Encounter (Signed)
Patient's wife called and said the patient is still having the same issues with waking at night and not remembering what occurred. She said he has seen his PCP and his labs are all normal. She'd like a call back from a nurse.

## 2020-09-11 ENCOUNTER — Encounter (INDEPENDENT_AMBULATORY_CARE_PROVIDER_SITE_OTHER): Payer: Medicare Other | Admitting: Ophthalmology

## 2020-09-11 DIAGNOSIS — H4311 Vitreous hemorrhage, right eye: Secondary | ICD-10-CM

## 2020-09-11 DIAGNOSIS — H04123 Dry eye syndrome of bilateral lacrimal glands: Secondary | ICD-10-CM

## 2020-09-11 DIAGNOSIS — H2101 Hyphema, right eye: Secondary | ICD-10-CM

## 2020-09-11 DIAGNOSIS — H3581 Retinal edema: Secondary | ICD-10-CM

## 2020-09-11 DIAGNOSIS — I10 Essential (primary) hypertension: Secondary | ICD-10-CM

## 2020-09-11 DIAGNOSIS — H35033 Hypertensive retinopathy, bilateral: Secondary | ICD-10-CM

## 2020-09-11 DIAGNOSIS — D571 Sickle-cell disease without crisis: Secondary | ICD-10-CM

## 2020-09-11 DIAGNOSIS — E113593 Type 2 diabetes mellitus with proliferative diabetic retinopathy without macular edema, bilateral: Secondary | ICD-10-CM

## 2020-09-11 DIAGNOSIS — Z961 Presence of intraocular lens: Secondary | ICD-10-CM

## 2020-09-11 NOTE — Telephone Encounter (Signed)
Spoke with patients wife regarding an appointment, and while on the phone she asked to send Dr Delice Lesch a message to let her know the "episodes are still going on, but when he has them, it seems like he's sleep walking based on how he acts." She reports that about 2 weeks ago his white blood cell count was good and he was offered a medication to help him sleep but refused one.

## 2020-09-11 NOTE — Telephone Encounter (Signed)
Spoke with pt wife she is going to talk to [facility] and call us back tomorrow to let us know if he is willing to do it, if he is them we will place the order

## 2020-09-11 NOTE — Telephone Encounter (Signed)
Can you pls ask his wife how they feel about doing an inpatient video EEG study so we can capture these episodes and see what is going on with his brain waves when they occur, this will mean he will need to be in the hospital for at least 3-5 days. Thanks

## 2020-09-12 ENCOUNTER — Other Ambulatory Visit: Payer: Self-pay

## 2020-09-12 ENCOUNTER — Telehealth: Payer: Self-pay | Admitting: Neurology

## 2020-09-12 ENCOUNTER — Telehealth: Payer: Self-pay

## 2020-09-12 ENCOUNTER — Telehealth: Payer: Medicare Other

## 2020-09-12 DIAGNOSIS — N2581 Secondary hyperparathyroidism of renal origin: Secondary | ICD-10-CM | POA: Diagnosis not present

## 2020-09-12 DIAGNOSIS — N186 End stage renal disease: Secondary | ICD-10-CM | POA: Diagnosis not present

## 2020-09-12 DIAGNOSIS — E1129 Type 2 diabetes mellitus with other diabetic kidney complication: Secondary | ICD-10-CM | POA: Diagnosis not present

## 2020-09-12 DIAGNOSIS — Z992 Dependence on renal dialysis: Secondary | ICD-10-CM | POA: Diagnosis not present

## 2020-09-12 DIAGNOSIS — R413 Other amnesia: Secondary | ICD-10-CM

## 2020-09-12 NOTE — Telephone Encounter (Signed)
  Chronic Care Management   Outreach Note  09/12/2020 Name: Isaiah [name] [facility] MRN: [id number] DOB: 1965-12-24  Referred by: [name], MD Reason for referral : [place] is enrolled in a Managed [place]: No  An unsuccessful telephone outreach was attempted today. The patient was referred to the case management team for assistance with care management and care coordination.   Follow Up Plan: A HIPAA compliant phone message was left for the patient providing contact information and requesting a return call.  The care management team will reach out to the patient again over the next 30 days.   [name], BSW, CDP Social Worker, Certified Dementia Practitioner [facility] [phone]

## 2020-09-12 NOTE — Telephone Encounter (Signed)
Spoke with pt wife she stated that Isaiah [name] does not want to do the inpatient EEG at this time due to [place], she stated that she will keep a log and if things get really bad she will call the office ,

## 2020-09-12 NOTE — Telephone Encounter (Signed)
See other phone note

## 2020-09-13 DIAGNOSIS — R269 Unspecified abnormalities of gait and mobility: Secondary | ICD-10-CM | POA: Diagnosis not present

## 2020-09-13 DIAGNOSIS — G8110 Spastic hemiplegia affecting unspecified side: Secondary | ICD-10-CM | POA: Diagnosis not present

## 2020-09-13 DIAGNOSIS — R2689 Other abnormalities of gait and mobility: Secondary | ICD-10-CM | POA: Diagnosis not present

## 2020-09-13 DIAGNOSIS — F445 Conversion disorder with seizures or convulsions: Secondary | ICD-10-CM | POA: Diagnosis not present

## 2020-09-13 DIAGNOSIS — G40909 Epilepsy, unspecified, not intractable, without status epilepticus: Secondary | ICD-10-CM | POA: Diagnosis not present

## 2020-09-13 MED FILL — clonazePAM 0.5 MG TABS: 0.5 | 35 days supply | Qty: 30 | Fill #1

## 2020-09-14 ENCOUNTER — Other Ambulatory Visit: Payer: Self-pay

## 2020-09-14 ENCOUNTER — Telehealth: Payer: Medicare Other

## 2020-09-14 ENCOUNTER — Encounter: Payer: Self-pay | Admitting: Physical Therapy

## 2020-09-14 ENCOUNTER — Ambulatory Visit: Payer: Self-pay

## 2020-09-14 DIAGNOSIS — E1129 Type 2 diabetes mellitus with other diabetic kidney complication: Secondary | ICD-10-CM | POA: Diagnosis not present

## 2020-09-14 DIAGNOSIS — E1122 Type 2 diabetes mellitus with diabetic chronic kidney disease: Secondary | ICD-10-CM

## 2020-09-14 DIAGNOSIS — Z992 Dependence on renal dialysis: Secondary | ICD-10-CM | POA: Diagnosis not present

## 2020-09-14 DIAGNOSIS — N186 End stage renal disease: Secondary | ICD-10-CM

## 2020-09-14 DIAGNOSIS — I120 Hypertensive chronic kidney disease with stage 5 chronic kidney disease or end stage renal disease: Secondary | ICD-10-CM

## 2020-09-14 DIAGNOSIS — N2581 Secondary hyperparathyroidism of renal origin: Secondary | ICD-10-CM | POA: Diagnosis not present

## 2020-09-14 DIAGNOSIS — R4182 Altered mental status, unspecified: Secondary | ICD-10-CM

## 2020-09-14 DIAGNOSIS — C9000 Multiple myeloma not having achieved remission: Secondary | ICD-10-CM

## 2020-09-14 NOTE — Therapy (Signed)
[facility] [address] [place], [place], [address] Phone: [phone]   Fax:  [phone]  Patient Details  Name: Alias Villagran [name] MRN: [id number] Date of Birth: 23-Jul-1966 Referring Provider:  No ref. provider found  Encounter Date: 09/14/2020  PHYSICAL THERAPY DISCHARGE SUMMARY  Visits from Start of Care: 28  Current functional level related to goals / functional outcomes:  PT Long Term Goals - 09/06/20 2215      PT LONG TERM GOAL #1   Title Pt will be independent with final progression of HEP for improved strength, blaance, gait.  TARGET 09/07/2020    Baseline 09/06/20; met in session today    Status Achieved      PT LONG TERM GOAL #2   Title Pt will imrpove Berg Balance score to at least 36/56 for decreased fall risk.    Baseline 09/06/20: 40/56 scored today    Status Achieved      PT LONG TERM GOAL #3   Title Pt will improve TUG score to less than or equal to 30 seconds for decreased fall risk.    Baseline 09/06/20: 24.45 sec's with RW    Status Achieved      PT LONG TERM GOAL #4   Title Pt will verbalize understanding of plans for continued community fitness upon d/c from PT.    Baseline 09/06/20: pt plans to look into the [name] for continued fitness    Status Achieved      PT LONG TERM GOAL #5   Title Pt will ascend/descent 12 steps with both handrails with supervision only, to negotiate steps at home.    Baseline 09/06/20: min guard for 4 steps with bil rails. pt reports having a stair lift at home he uses when is fatigued.    Status Partially Met          Pt has met 4 of 5 LTGs.  He has demonstrated improved overall balance throughout course of therapy   Remaining deficits: Balance scores on Berg and TUG indicate continued fall risk, fluctuating mobility based on multiple medical issues.   Education / Equipment: Educated in [employer], fall prevention  Plan: Patient agrees to discharge.   Patient goals were partially met. Patient is being discharged due to meeting the stated rehab goals.  ?????       Alton Tremblay W. 09/14/2020, 9:55 AM  Mady Haagensen, PT 09/14/20 9:57 AM Phone: [phone] Fax: [facility] [address] [place], [place], [address] Phone: [phone]   Fax:  [phone]

## 2020-09-16 MED FILL — VIMPAT 200 MG TABLET: 200 | 37 days supply | Qty: 90 | Fill #1

## 2020-09-17 DIAGNOSIS — E1129 Type 2 diabetes mellitus with other diabetic kidney complication: Secondary | ICD-10-CM | POA: Diagnosis not present

## 2020-09-17 DIAGNOSIS — N186 End stage renal disease: Secondary | ICD-10-CM | POA: Diagnosis not present

## 2020-09-17 DIAGNOSIS — N2581 Secondary hyperparathyroidism of renal origin: Secondary | ICD-10-CM | POA: Diagnosis not present

## 2020-09-17 DIAGNOSIS — Z992 Dependence on renal dialysis: Secondary | ICD-10-CM | POA: Diagnosis not present

## 2020-09-18 NOTE — Chronic Care Management (AMB) (Signed)
Chronic Care Management   CCM RN Visit Note  09/18/2020 Name: Isaiah [name] [facility] MRN: [id number] DOB: October 15, 1965  Subjective: Isaiah [name] [name] is a 55 y.o. year old male who is a primary care patient of Isaiah Chard, MD. The CCM team was consulted for assistance with chronic disease management and care coordination needs.    Engaged with patient by telephone for follow up visit in response to provider referral for pharmacy case management and/or care coordination services.   Consent to Services:  The patient was given the following information about Chronic Care Management services today, agreed to services, and gave verbal consent: 1. CCM service includes personalized support from designated clinical staff supervised by the primary care provider, including individualized plan of care and coordination with other care providers 2. 24/7 contact phone numbers for assistance for urgent and routine care needs. 3. Service will only be billed when office clinical staff spend 20 minutes or more in a month to coordinate care. 4. Only one practitioner may furnish and bill the service in a calendar month. 5.The patient may stop CCM services at any time (effective at the end of the month) by phone call to the office staff. 6. The patient will be responsible for cost sharing (co-pay) of up to 20% of the service fee (after annual deductible is met). Patient agreed to services and consent obtained.  Patient agreed to services and verbal consent obtained.   Assessment/Interventions: Review of patient past medical history, allergies, medications, health status, including review of consultants reports, laboratory and other test data, was performed as part of comprehensive evaluation and provision of chronic care management services.   SDOH (Social Determinants of Health) assessments and interventions performed:    CCM Care Plan  Allergies  Allergen Reactions  . Codeine Swelling  . Codeine Rash and Other (See  Comments)    Unknown reaction (patient says it was more serious than just a rash, but he can't remember what happened)     Outpatient Encounter Medications as of 09/14/2020  Medication Sig  . acetaminophen (TYLENOL) 325 MG tablet Take 1-2 tablets (325-650 mg total) by mouth every 4 (four) hours as needed for mild pain.  [name] acyclovir (ZOVIRAX) 400 MG tablet TAKE 1 TABLET BY MOUTH ONCE DAILY  . albuterol (PROAIR HFA) 108 (90 Base) MCG/ACT inhaler Inhale two puffs every 4-6 hours if needed for cough or wheeze (Patient taking differently: Inhale 2 puffs into the lungs every 4 (four) hours as needed for wheezing or shortness of breath. Inhale two puffs every 4-6 hours if needed for cough or wheeze)  . Alcohol Swabs (SM ALCOHOL PREP) 70 % PADS   . atorvastatin (LIPITOR) 20 MG tablet TAKE 1 TABLET BY MOUTH ONCE A DAY  . AURYXIA 1 GM 210 MG(Fe) tablet Take 210 mg by mouth 3 (three) times daily.  [name] BREO ELLIPTA 200-25 MCG/INH AEPB INHALE 1 PUFF INTO THE LUNGS AT THE SAME TIME EVERY DAY  . calcium acetate (PHOSLO) 667 MG capsule Take by mouth.  . camphor-menthol (SARNA) lotion Apply 1 application topically every 8 (eight) hours as needed for itching.  . Cetirizine HCl 10 MG CAPS Take 1 capsule by mouth daily.   . Cholecalciferol (D3-1000 PO) Take by mouth.  . Cinacalcet HCl (SENSIPAR PO) Take by mouth.  (Patient not taking: Reported on 07/19/2020)  . clonazePAM (KLONOPIN) 0.5 MG tablet Take 2 tablets every Monday, Wednesday, and Friday with hemodialysis  . FREESTYLE LITE test strip   . hydrOXYzine (ATARAX/VISTARIL) 25  MG tablet TAKE 1 TABLET (25 MG TOTAL) BY MOUTH EVERY 8 (EIGHT) HOURS AS NEEDED FOR ITCHING.  . Insulin Glargine (BASAGLAR KWIKPEN) 100 UNIT/ML Inject 8 Units into the skin daily.  . Insulin Lispro (HUMALOG KWIKPEN [place]) Inject 2-4 Units into the skin 3 (three) times daily before meals. Sliding scale: 150-250 =2 units, 250 -350=3 units, 350 += 4 units  . lacosamide (VIMPAT) 200 MG TABS tablet  Take 1 tablet twice a day except on dialysis days M-W-F give 1 tab in AM, 1 tab after dialysis, 1 tab in PM.  . lamoTRIgine (LAMICTAL) 200 MG tablet Take 1 tablet (200 mg total) by mouth 2 (two) times daily.  . Lancets (FREESTYLE) lancets   . metoprolol tartrate (LOPRESSOR) 25 MG tablet Take 25 mg by mouth daily.  . midodrine (PROAMATINE) 10 MG tablet TAKE 1 TABLET BY MOUTH TWICE DAILY WITH MEALS  . multivitamin (RENA-VIT) TABS tablet Take 1 tablet by mouth at bedtime.  . Nutritional Supplements (FEEDING SUPPLEMENT, NEPRO CARB STEADY,) LIQD Take 237 mLs by mouth 3 (three) times daily as needed (Supplement).  . pantoprazole (PROTONIX) 40 MG tablet TAKE 1 TABLET BY MOUTH ONCE DAILY  . prochlorperazine (COMPAZINE) 10 MG tablet TAKE 1 TABLET (10 MG TOTAL) BY MOUTH EVERY 6 (SIX) HOURS AS NEEDED FOR NAUSEA OR VOMITING.  [name] UNIFINE PENTIPS 31G X 8 MM MISC USE AS DIRECTED DAILY AFTER SUPPER   No facility-administered encounter medications on file as of 09/14/2020.    Patient Active Problem List   Diagnosis Date Noted  . Colitis 03/15/2020  . AMS (altered mental status) 10/05/2019  . Acute metabolic encephalopathy 56/97/9480  . Headache, unspecified 09/28/2019  . Abnormality of gait 09/13/2019  . Diabetes mellitus with end-stage renal disease ([place]) 08/07/2019  . Benign hypertensive kidney disease with chronic kidney disease 08/07/2019  . Hypothyroidism, unspecified 07/15/2019  . Spastic hemiparesis ([place]) 07/12/2019  . Dysphagia, oropharyngeal phase 07/05/2019  . Allergy, unspecified, initial encounter 06/27/2019  . PAF (paroxysmal atrial fibrillation) ([place])   . Bacteremia   . Labile blood pressure   . Labile blood glucose   . Controlled type 2 diabetes mellitus with hyperglycemia, without long-term current use of insulin ([place])   . ESRD on dialysis ([place])   . Right sided weakness   . SDH (subdural hematoma) ([place])   . Chronic intracranial subdural hematoma (HCC) 06/09/2019  . Multiple myeloma  not having achieved remission ([place]) 06/09/2019  . ESRD on hemodialysis ([place]) 06/09/2019  . Essential hypertension 06/09/2019  . DM type 2 (diabetes mellitus, type 2) ([place]) 06/09/2019  . Seizure disorder ([place]) 06/09/2019  . Aspiration pneumonia ([place]) 06/09/2019  . History of bacteremia 06/09/2019  . Atrial fibrillation, chronic ([place]) 06/09/2019  . Bilateral kidney stones 05/31/2019  . Proteus infection 05/28/2019  . History of restrictive pulmonary disease 04/07/2019  . Multiple myeloma ([place]) 05/17/2018  . Goals of care, counseling/discussion 05/17/2018  . Partial idiopathic epilepsy with seizures of localized onset, intractable, without status epilepticus ([place]) 11/02/2017  . Iron deficiency anemia, unspecified 01/22/2017  . COPD (chronic obstructive pulmonary disease) ([place]) 11/11/2016  . CVA (cerebral vascular accident) ([place]) 11/11/2016  . GERD (gastroesophageal reflux disease) 11/11/2016  . GI bleed 11/11/2016  . 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  . Obesity (BMI 30-39.9) 11/11/2016  . Pulmonary hypertension ([place]) 11/11/2016  . Patent foramen ovale 11/11/2016  . Hyperparathyroidism, secondary renal ([place]) 11/11/2016  . VTE (venous thromboembolism) 11/11/2016  . Hypotension 11/11/2016  . Systemic hypertension 11/11/2016  . Seizure (  [place]) 11/11/2016  . Other seizures ([place]) 07/25/2016  . Encounter for removal of sutures 05/27/2016  . Ingrown nail 03/03/2016  . Onychomycosis due to dermatophyte 03/03/2016  . Other acquired hammer toe 03/03/2016  . Hemiparesis affecting right side as late effect of cerebrovascular accident ([place]) 02/28/2016  . Aphasia complicating stroke 75/17/0017  . Seizures ([place]) 01/08/2016  . History of ischemic left MCA stroke 01/08/2016  . Right sided weakness 01/08/2016  . Atrial fibrillation ([place]) 01/08/2016  . Leukocytosis 01/08/2016  . Benign essential HTN 01/08/2016  . Anemia of chronic disease 01/08/2016  . Controlled type 2 diabetes  mellitus with diabetic nephropathy, without long-term current use of insulin ([place]) 01/08/2016  . History of DVT (deep vein thrombosis) 01/08/2016  . 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 Hypocalcemia 12/26/2015  . Long term (current) use of anticoagulants 11/07/2015  . Aftercare including intermittent dialysis ([place]) 11/02/2015  . Other specified coagulation defects ([place]) 11/02/2015  . Complication of vascular dialysis catheter 11/02/2015  . Diarrhea, unspecified 11/02/2015  . Fever, unspecified 11/02/2015  . Pruritus, unspecified 11/02/2015  . Shortness of breath 11/02/2015  . ESRD (end stage renal disease) on dialysis ([place])     Conditions to be addressed/monitored:DMII and ESRD, Multiple Myeloma  Goals Addressed    . COMPLETED: "to follow his renal diet and fluid restriction as recommended"       Wife stated:  CARE PLAN ENTRY (see longitudinal plan of care for additional care plan information)  Current Barriers:  [name] Knowledge Deficit related to nutritional and fluid intake per ESRD diet recommendations  . Chronic Disease Management support and education needs related to DM with ESRD, Multiple Myeloma  Nurse Case Manager Clinical Goal(s):  [name] Over the next 90 days, patient will verbalize basic understanding of End Stage Renal disease process and self health management plan as evidenced by patient will maintain ideal body weight without excess fluid greater than 6 lbs between HD treatments . Over the next 90 days, patient will verbalize basic understanding of End Stage Renal disease process and self health management plan as evidenced by patient will maintain adequate nutritional intake per ESRD diet recommendations  CCM RN CM Interventions:  09/14/20 call completed with patient's [name]  . Inter-disciplinary care team collaboration (see longitudinal plan of care) . Evaluation of current treatment plan related to ESRD w/hemodialysis and patient's adherence to plan as established by  provider . Determined patient continues to attend the [facility] in [place] on Tuesday, Thursdays and Saturdays; he is followed by Dr. Jimmy Footman . Determined patient has an AVF for use of hemodialysis; Re-educated on how to check access for patency   . Determined patient's dialysis treatments have been uneventful, he is adhering to his renal diet and fluid restrictions as directed, wife denies questions at this time . Determined patient is adhering to his prescribed diet/fluid recommendations for ESRD and wife denies having questions at this time  . Discussed plans with patient for ongoing care management follow up and provided patient with direct contact information for care management team  Patient Self Care Activities:  . Continue to self administer medications as prescribed . Attend all scheduled provider appointments . Call pharmacy for medication refills . Call provider office for new concerns or questions  Initial goal documentation and Please see past updates related to this goal by clicking on the "Past Updates" button in the selected goal      Patient Care Plan: General Plan of Care (Adult)    Problem Identified:  Cognitive Impairment with change in behavior   Priority: High    Long-Range Goal: Manage symptoms of impaired cognition with change in behavior   Start Date: 09/14/2020  Expected End Date: 11/09/2020  This Visit's Progress: On track  Priority: High  Note:   Current Barriers:   Ineffective Self Health Maintenance  Unable to self administer medications as prescribed  Unable to perform ADLs independently  Unable to perform IADLs independently  Currently UNABLE TO independently self manage needs related to chronic health conditions.   Knowledge Deficits related to short term plan for care coordination needs and long term plans for chronic disease management needs Nurse Case Manager Clinical Goal(s):   Over the next 90 days, patient will work with  care management team to address care coordination and chronic disease management needs related to Disease Management  Educational Needs  Care Coordination  Medication Management and Education  Psychosocial Support  Caregiver Stress support   Interventions:  . Determined patient's cognition with behavior changes have resolved, wife feels patient is at baseline . Educated on importance of notifying Nephrology of patient's recent symptoms of impaired cognition with change in behavior, in order to ask to have patient's Aluminum level checked, provided rationale to wife  . Discussed wife will discuss this further with patient's Nephrologist at next HD treatment  . Determined wife will report new or worsening symptoms to MD promptly for further evaluation/treatment  . Discussed plans with patient for ongoing care management follow up and provided patient with direct contact information for care management team Patient Goals/Self-Care Activities:  Over the next 60 days, patient/wife will:  -notify MD if symptoms of impaired cognition and or behavior changes persist or worsen -discuss patient's symptoms with Nephrologist and ask about having patient's Aluminum level checked while at HD -Continue to administer medications exactly as prescribed  -keep all scheduled MD follow up appointments   Follow Up Plan: Telephone follow up appointment with care management team member scheduled for: 10/29/20     Plan:Telephone follow up appointment with care management team member scheduled for:  10/29/20  Barb Merino, RN, BSN, CCM Care Management Coordinator [place] [facility]  Direct Phone: [phone]

## 2020-09-18 NOTE — Patient Instructions (Signed)
Visit Information Goals Addressed    . Evaluate and treat impaired cognition with behavior changes       Timeframe:  Long-Range Goal Priority:  High Start Date:  09/14/20                           Expected End Date:  11/09/20  Next Follow up Call Scheduled: 10/29/20  Over the next 60 days, patient/wife will:  -notify MD if symptoms of impaired cognition and or behavior changes persist or worsen -discuss patient's symptoms with Nephrologist and ask about having patient's Aluminum level checked while at HD -Continue to administer medications exactly as prescribed  -keep all scheduled MD follow up appointments                           Patient Care Plan: General Plan of Care (Adult)    Problem Identified: Cognitive Impairment with change in behavior   Priority: High    Long-Range Goal: Manage symptoms of impaired cognition with change in behavior   Start Date: 09/14/2020  Expected End Date: 11/09/2020  This Visit's Progress: On track  Priority: High  Note:   Current Barriers:   Ineffective Self Health Maintenance  Unable to self administer medications as prescribed  Unable to perform ADLs independently  Unable to perform IADLs independently  Currently UNABLE TO independently self manage needs related to chronic health conditions.   Knowledge Deficits related to short term plan for care coordination needs and long term plans for chronic disease management needs Nurse Case Manager Clinical Goal(s):   Over the next 90 days, patient will work with care management team to address care coordination and chronic disease management needs related to Disease Management  Educational Needs  Care Coordination  Medication Management and Education  Psychosocial Support  Caregiver Stress support   Interventions:  . Determined patient's cognition with behavior changes have resolved, wife feels patient is at baseline . Educated on importance of notifying Nephrology of patient's recent  symptoms of impaired cognition with change in behavior, in order to ask to have patient's Aluminum level checked, provided rationale to wife  . Discussed wife will discuss this further with patient's Nephrologist at next HD treatment  . Determined wife will report new or worsening symptoms to MD promptly for further evaluation/treatment  . Discussed plans with patient for ongoing care management follow up and provided patient with direct contact information for care management team Patient Goals/Self-Care Activities:  Over the next 60 days, patient/wife will:  -notify MD if symptoms of impaired cognition and or behavior changes persist or worsen -discuss patient's symptoms with Nephrologist and ask about having patient's Aluminum level checked while at HD -Continue to administer medications exactly as prescribed  -keep all scheduled MD follow up appointments   Follow Up Plan: Telephone follow up appointment with care management team member scheduled for: 10/29/20     The patient verbalized understanding of instructions, educational materials, and care plan provided today and declined offer to receive copy of patient instructions, educational materials, and care plan.   Telephone follow up appointment with care management team member scheduled for: 10/29/20  Lynne [place], RN

## 2020-09-19 DIAGNOSIS — E1129 Type 2 diabetes mellitus with other diabetic kidney complication: Secondary | ICD-10-CM | POA: Diagnosis not present

## 2020-09-19 DIAGNOSIS — N186 End stage renal disease: Secondary | ICD-10-CM | POA: Diagnosis not present

## 2020-09-19 DIAGNOSIS — Z992 Dependence on renal dialysis: Secondary | ICD-10-CM | POA: Diagnosis not present

## 2020-09-19 DIAGNOSIS — N2581 Secondary hyperparathyroidism of renal origin: Secondary | ICD-10-CM | POA: Diagnosis not present

## 2020-09-21 DIAGNOSIS — N2581 Secondary hyperparathyroidism of renal origin: Secondary | ICD-10-CM | POA: Diagnosis not present

## 2020-09-21 DIAGNOSIS — Z992 Dependence on renal dialysis: Secondary | ICD-10-CM | POA: Diagnosis not present

## 2020-09-21 DIAGNOSIS — N186 End stage renal disease: Secondary | ICD-10-CM | POA: Diagnosis not present

## 2020-09-21 DIAGNOSIS — E1129 Type 2 diabetes mellitus with other diabetic kidney complication: Secondary | ICD-10-CM | POA: Diagnosis not present

## 2020-09-24 ENCOUNTER — Other Ambulatory Visit: Payer: Self-pay | Admitting: Internal Medicine

## 2020-09-24 DIAGNOSIS — N186 End stage renal disease: Secondary | ICD-10-CM | POA: Diagnosis not present

## 2020-09-24 DIAGNOSIS — N2581 Secondary hyperparathyroidism of renal origin: Secondary | ICD-10-CM | POA: Diagnosis not present

## 2020-09-24 DIAGNOSIS — E1129 Type 2 diabetes mellitus with other diabetic kidney complication: Secondary | ICD-10-CM | POA: Diagnosis not present

## 2020-09-24 DIAGNOSIS — Z992 Dependence on renal dialysis: Secondary | ICD-10-CM | POA: Diagnosis not present

## 2020-09-24 MED FILL — HYDROXYZINE HCL 25 MG TABS: 25 | 10 days supply | Qty: 30 | Fill #0

## 2020-09-24 MED FILL — PROCHLORPERAZINE 10 MG TAB: 10 | 7 days supply | Qty: 30 | Fill #0

## 2020-09-24 MED FILL — BREO ELLIPTA 200-25 MCG INH: 200-25 | 30 days supply | Qty: 60 | Fill #2

## 2020-09-24 MED FILL — AURYXIA 210 MG TABLET: 1 GM | 30 days supply | Qty: 90 | Fill #7

## 2020-09-25 ENCOUNTER — Ambulatory Visit (INDEPENDENT_AMBULATORY_CARE_PROVIDER_SITE_OTHER): Payer: Medicare Other | Admitting: Ophthalmology

## 2020-09-25 ENCOUNTER — Other Ambulatory Visit: Payer: Self-pay

## 2020-09-25 ENCOUNTER — Encounter (INDEPENDENT_AMBULATORY_CARE_PROVIDER_SITE_OTHER): Payer: Self-pay | Admitting: Ophthalmology

## 2020-09-25 DIAGNOSIS — I10 Essential (primary) hypertension: Secondary | ICD-10-CM

## 2020-09-25 DIAGNOSIS — H3581 Retinal edema: Secondary | ICD-10-CM | POA: Diagnosis not present

## 2020-09-25 DIAGNOSIS — H36 Retinal disorders in diseases classified elsewhere: Secondary | ICD-10-CM | POA: Diagnosis not present

## 2020-09-25 DIAGNOSIS — E113593 Type 2 diabetes mellitus with proliferative diabetic retinopathy without macular edema, bilateral: Secondary | ICD-10-CM | POA: Diagnosis not present

## 2020-09-25 DIAGNOSIS — H35033 Hypertensive retinopathy, bilateral: Secondary | ICD-10-CM | POA: Diagnosis not present

## 2020-09-25 DIAGNOSIS — D571 Sickle-cell disease without crisis: Secondary | ICD-10-CM | POA: Diagnosis not present

## 2020-09-25 DIAGNOSIS — H04123 Dry eye syndrome of bilateral lacrimal glands: Secondary | ICD-10-CM | POA: Diagnosis not present

## 2020-09-25 DIAGNOSIS — Z961 Presence of intraocular lens: Secondary | ICD-10-CM | POA: Diagnosis not present

## 2020-09-25 DIAGNOSIS — H4311 Vitreous hemorrhage, right eye: Secondary | ICD-10-CM

## 2020-09-25 DIAGNOSIS — H2101 Hyphema, right eye: Secondary | ICD-10-CM | POA: Diagnosis not present

## 2020-09-25 DIAGNOSIS — H3689 Other retinal disorders in diseases classified elsewhere: Secondary | ICD-10-CM

## 2020-09-25 NOTE — Progress Notes (Signed)
[name]  09/25/2020     CHIEF COMPLAINT Patient presents for Retina Follow Up   HISTORY OF PRESENT ILLNESS: Isaiah [name] is a 55 y.o. male who presents to the clinic today for:   HPI    Retina Follow Up    Patient presents with  Diabetic Retinopathy.  In both eyes.  This started months ago.  Severity is moderate.  Duration of 3 months.  Since onset it is stable.  I, the attending physician,  performed the HPI with the patient and updated documentation appropriately.          Comments    55 y/o male pt here for 3.5 mo f/u for PDR OU.  Hard to tell if VA OU has changed.  Denies FOL.  Has occasional floaters OU, and occasional pain/irritation OU due to dry eye.  AT prn OU.  BS 171 after lunch today.  A1C "under 5."       Last edited by [name], MD on 09/25/2020  2:10 PM. (History)    pt states he "can't see", he states he had dialysis and there was a lot of pressure in his eyes during it, pts wife called Dr. Payton Emerald office and was told to use AT's and that should help with the pain, pt has no new health problems or hospitalizations   Referring physician: [name], MD [address] [place],  [place] [address]  HISTORICAL INFORMATION:   Selected notes from the [facility] of Dr. [name].  Urgent re-referral from Dr. [name] for possible PDR w/NVI OD.  Presented to [facility] on 7[facility]27[facility] w/elevated IOPS and OD swelling and irritation.  Started on Brimonidine BID OD and Pred Q1hr OD.   CURRENT MEDICATIONS: No current outpatient medications on file. (Ophthalmic Drugs)   No current facility-administered medications for this visit. (Ophthalmic Drugs)   Current Outpatient Medications (Other)  Medication Sig  . acetaminophen (TYLENOL) 325 MG tablet Take 1-2 tablets (325-650 mg total) by mouth every 4 (four) hours as needed for mild pain.  [name] acyclovir (ZOVIRAX) 400 MG tablet TAKE 1 TABLET BY MOUTH ONCE DAILY   . albuterol (PROAIR HFA) 108 (90 Base) MCG/ACT inhaler Inhale two puffs every 4-6 hours if needed for cough or wheeze (Patient taking differently: Inhale 2 puffs into the lungs every 4 (four) hours as needed for wheezing or shortness of breath. Inhale two puffs every 4-6 hours if needed for cough or wheeze)  . Alcohol Swabs (SM ALCOHOL PREP) 70 % PADS   . atorvastatin (LIPITOR) 20 MG tablet TAKE 1 TABLET BY MOUTH ONCE A DAY  . AURYXIA 1 GM 210 MG(Fe) tablet Take 210 mg by mouth 3 (three) times daily.  [name] BREO ELLIPTA 200-25 MCG/INH AEPB INHALE 1 PUFF INTO THE LUNGS AT THE SAME TIME EVERY DAY  . calcitRIOL (ROCALTROL) 0.25 MCG capsule 1 capsule  . calcium acetate (PHOSLO) 667 MG capsule Take by mouth.  . camphor-menthol (SARNA) lotion Apply 1 application topically every 8 (eight) hours as needed for itching.  . Cetirizine HCl 10 MG CAPS Take 1 capsule by mouth daily.   . Cholecalciferol (D3-1000 PO) Take by mouth.  . Cinacalcet HCl (SENSIPAR PO) Take by mouth.  . clonazePAM (KLONOPIN) 0.5 MG tablet Take 2 tablets every Monday, Wednesday, and Friday with hemodialysis  . dexamethasone (DECADRON) 4 MG tablet 1 tablet  . dextrose 50 % solution Dextrose 50%  . FREESTYLE LITE test strip   .  Glucagon, rDNA, (GLUCAGON EMERGENCY) 1 MG KIT See admin instructions.  . hydrOXYzine (ATARAX/VISTARIL) 25 MG tablet TAKE 1 TABLET BY MOUTH EVERY 8 HOURS AS NEEDED FOR ITCHING  . Insulin Glargine (BASAGLAR KWIKPEN) 100 UNIT/ML Inject 8 Units into the skin daily.  . Insulin Lispro (HUMALOG KWIKPEN [place]) Inject 2-4 Units into the skin 3 (three) times daily before meals. Sliding scale: 150-250 =2 units, 250 -350=3 units, 350 += 4 units  . lacosamide (VIMPAT) 200 MG TABS tablet Take 1 tablet twice a day except on dialysis days M-W-F give 1 tab in AM, 1 tab after dialysis, 1 tab in PM.  . lamoTRIgine (LAMICTAL) 200 MG tablet Take 1 tablet (200 mg total) by mouth 2 (two) times daily.  . Lancets (FREESTYLE) lancets   .  Methoxy PEG-Epoetin Beta (MIRCERA IJ) Mircera  . Methoxy PEG-Epoetin Beta (MIRCERA IJ) Mircera  . metoprolol tartrate (LOPRESSOR) 25 MG tablet Take 25 mg by mouth daily.  . midodrine (PROAMATINE) 10 MG tablet TAKE 1 TABLET BY MOUTH TWICE DAILY WITH MEALS  . multivitamin (RENA-VIT) TABS tablet Take 1 tablet by mouth at bedtime.  . Nutritional Supplements (FEEDING SUPPLEMENT, NEPRO CARB STEADY,) LIQD Take 237 mLs by mouth 3 (three) times daily as needed (Supplement).  . pantoprazole (PROTONIX) 40 MG tablet TAKE 1 TABLET BY MOUTH ONCE DAILY  . prochlorperazine (COMPAZINE) 10 MG tablet TAKE 1 TABLET (10 MG TOTAL) BY MOUTH EVERY 6 (SIX) HOURS AS NEEDED FOR NAUSEA OR VOMITING.  [name] UNIFINE PENTIPS 31G X 8 MM MISC USE AS DIRECTED DAILY AFTER SUPPER   No current facility-administered medications for this visit. (Other)   REVIEW OF SYSTEMS: ROS    Positive for: Gastrointestinal, Neurological, Genitourinary, Endocrine, Cardiovascular, Eyes, Respiratory   Negative for: Constitutional, Skin, Musculoskeletal, HENT, Psychiatric, Allergic/Imm, Heme/Lymph   Last edited by Matthew Folks, COA on 09/25/2020  1:16 PM. (History)     ALLERGIES Allergies  Allergen Reactions  . Codeine Swelling  . Codeine Rash and Other (See Comments)    Unknown reaction (patient says it was more serious than just a rash, but he can't remember what happened)    PAST MEDICAL HISTORY Past Medical History:  Diagnosis Date  . A-fib ([place])   . Anemia   . Asthma   . Diabetic retinopathy ([place])    PDR OU  . DM type 2 (diabetes mellitus, type 2) ([place]) 06/09/2019  . ESRD (end stage renal disease) on dialysis ([place])   . Essential hypertension 06/09/2019  . GIB (gastrointestinal bleeding)    Recurrent episodes- 09/2014, 09/2015 and 05/2016  . Gout   . History of recent blood transfusion 10/27/14   2 Units PRBC's  . Hyperkalemia 07/2011  . Hypertensive retinopathy    OU  . Multiple myeloma ([place])   . OSA on CPAP   . Pulmonary  embolism ([place]) 07/2011; 09/27/2014   a. Bilat PE 07/2011 - unclear cause, tx with 6 months Coumadin.;   . Seizure disorder ([place]) 06/09/2019  . Sepsis ([place])   . Sickle cell-thalassemia disease ([place])    a. Sickle cell trait.  . Sleep apnea   . Stroke ([place]) 09/2015   R-MCA, L-MCA, PCA and bilateral cerebellar complicated by DVT/PE  . Subdural hematoma ([place]) 05/2019   Past Surgical History:  Procedure Laterality Date  . AV FISTULA PLACEMENT Right 12/05/2015   Procedure: INSERTION OF ARTERIOVENOUS (AV) GORE-TEX GRAFT ARM;  Surgeon: Elam Dutch, MD;  Location: [facility];  Service: Vascular;  Laterality: Right;  . BONE  MARROW BIOPSY    . CATARACT EXTRACTION Right 09/2019   Dr. Herbert Deaner  . CATARACT EXTRACTION Left 10/2019   Dr. Herbert Deaner  . CHOLECYSTECTOMY    . CHOLECYSTECTOMY  1990's?  . COLONOSCOPY WITH PROPOFOL N/A 01/22/2017   Procedure: COLONOSCOPY WITH PROPOFOL;  Surgeon: Wilford Corner, MD;  Location: [facility];  Service: Endoscopy;  Laterality: N/A;  . EYE SURGERY Bilateral    Cat Sx - Dr. Herbert Deaner  . INSERTION OF DIALYSIS CATHETER Right 10/16/2015   Procedure: INSERTION OF PALINDROME DIALYSIS CATHETER ;  Surgeon: Elam Dutch, MD;  Location: [facility];  Service: Vascular;  Laterality: Right;  . OTHER SURGICAL HISTORY     Retinal surgery  . PARS PLANA VITRECTOMY  02/17/2012   Procedure: PARS PLANA VITRECTOMY WITH 25 GAUGE;  Surgeon: Hayden Pedro, MD;  Location: [facility];  Service: Ophthalmology;  Laterality: Right;  . PERIPHERAL VASCULAR CATHETERIZATION N/A 03/20/2016   Procedure: A/V Shuntogram/Fistulagram;  Surgeon: Conrad Eleanor, MD;  Location: [facility];  Service: Cardiovascular;  Laterality: N/A;  . TEE WITHOUT CARDIOVERSION N/A 12/02/2016   Procedure: TRANSESOPHAGEAL ECHOCARDIOGRAM (TEE);  Surgeon: Larey Dresser, MD;  Location[facility] END[facility]COPY;  Service: Cardiovascular;  Laterality: N/A;   FAMILY HISTORY Family History  Problem Relation Age of Onset  . Hypertension Mother    . Hypertension Father   . Stroke Father   . Sickle cell anemia Brother   . Diabetes Mother   . Sickle cell anemia Brother   . Diabetes type I Brother   . Kidney disease Brother    SOCIAL HISTORY Social History   Tobacco Use  . Smoking status: Never Smoker  . Smokeless tobacco: Never Used  Vaping Use  . Vaping Use: Never used  Substance Use Topics  . Alcohol use: Not Currently  . Drug use: Never         OPHTHALMIC EXAM:  Base Eye Exam    Visual Acuity (Snellen - Linear)      Right Left   Dist cc 20/200 20/80 -2   Dist ph cc NI NI   Correction: Glasses       Tonometry (Tonopen, 1:22 PM)      Right Left   Pressure 15 16       Pupils      Dark Light Shape React APD   Right 3 2 Round Minimal None   Left 3 2 Round Minimal None       Visual Fields (Counting fingers)      Left Right    Full Full       Extraocular Movement      Right Left    Full, Ortho Full, Ortho       Neuro/Psych    Oriented x3: Yes   Mood/Affect: Normal       Dilation    Both eyes: 1.0% Mydriacyl, 2.5% Phenylephrine @ 1:22 PM        Slit Lamp and Fundus Exam    Slit Lamp Exam      Right Left   Lids/Lashes Dermato Dermato   Conjunctiva/Sclera Melanosis, 1+ Injection Melanosis   Cornea Arcus, well healed cataract wound, 1-2+ PEE 2+ PEE, well healed temporal cataract wounds   Anterior Chamber Deep and quiet Deep and quiet   Iris Round and poorly dilated to 4.59m, No NVI Round and moderately dilated to 5.070m No NVI   Lens PC IOL in good position PCIOL   Vitreous Post vitrectomy.  Residual anterior vitreous syneresis.  +  RBC - improved, +pigment Mild asteroid hyalosis, syneresis       Fundus Exam      Right Left   Disc 2+pallor. Sharp rim Hazy view, mild pallor, sharp rim   C/D Ratio 0.7 0.5   Macula Blunted foveal reflex. atrophic; ischemic, no heme Flat, Blunted foveal reflex, No heme or edema   Vessels Attenuated, tortuous, severe midzonal attenuation and ischemia  Attenuated, tortuous   Periphery Attached, Peripheral laser scars, good posterior fill in 360 Attached.  360 PRP          IMAGING AND PROCEDURES  Imaging and Procedures for 09/25/2020  OCT, Retina - OU - Both Eyes       Right Eye Quality was borderline. Central Foveal Thickness: 249. Progression has been stable. Findings include normal foveal contour, no IRF, no SRF, outer retinal atrophy, epiretinal membrane, inner retinal atrophy (Focal hyper reflectivity and trace edema nasal macula; diffuse retinal atrophy).   Left Eye Quality was borderline. Central Foveal Thickness: 245. Progression has been stable. Findings include normal foveal contour, no SRF, epiretinal membrane (Diffuse retinal atrophy/thinning; stable retinoschisis temporal midzone caught on widefield).   Notes *Images captured and stored on drive  Diagnosis / Impression:  OD: Focal hyper reflectivity and trace edema nasal macula; diffuse retinal atrophy OS: Diffuse retinal atrophy/thinning; retinoschisis temporal midzone caught on widefield -- stable  Clinical management:  See below  Abbreviations: NFP - Normal foveal profile. CME - cystoid macular edema. PED - pigment epithelial detachment. IRF - intraretinal fluid. SRF - subretinal fluid. EZ - ellipsoid zone. ERM - epiretinal membrane. ORA - outer retinal atrophy. ORT - outer retinal tubulation. SRHM - subretinal hyper-reflective material. IRHM - intraretinal hyper-reflective material                ASSESSMENT/PLAN:    ICD-10-CM   1. Vitreous hemorrhage, right eye (HCC)  H43.11   2. Hyphema of right eye  H21.01   3. Proliferative diabetic retinopathy of both eyes without macular edema associated with type 2 diabetes mellitus ([place])  Q00.8676   4. Retinal edema  H35.81 OCT, Retina - OU - Both Eyes  5. Sickle cell retinopathy without crisis ([name])  D57.1    H36   6. Essential hypertension  I10   7. Hypertensive retinopathy of both eyes  H35.033   8.  Pseudophakia of both eyes  Z96.1   9. Bilateral dry eyes  H04.123    1,2. VH & microyphema OD secondary to PDR -- stably resolved  - s/p IVA OD #1 (07.29.21)  - VH and microhyphema stably resolved OD -- back to baseline (BCVA 20/200 OD)  - NVI OD regressed      - cont Cosopt BID OD  - VH precautions reviewed -- minimize activities, keep head elevated, avoid ASA/NSAIDs/blood thinners as able  - F/u in 6 months w/ DFE/OCT  3,4. Proliferative diabetic retinopathy w/ retinal ischemia, no DME, OU (OD > OS)  - pt of Dr. [name], last visit 03.04.21  - history of PPV w/ endolaser, FAX OD 6.11.13; PRP OS 2020  - s/p IVA OD #1 (07.29.21) as above for new VH and microhyphema  - s/p PRP OD (08.24.21)  - exam shows NVI regressed and VH improved OD; OS with good 360 PRP and +asteroid hyalosis  - OCT without diabetic macular edema, but diffuse atrophy, both eyes   - FA (08.12.21) shows severe vascular perfusion OD -- peripheral nonperfusion extends posteriorly just outside arcades   - F/u in 6  months w/DFE/OCT  5. History of Sickle Cell Retinopathy  - 360 PRP in place OU  - no active NV, confirmed on FA 8.12.21  6,7. Hypertensive retinopathy OU - discussed importance of tight BP control - monitor  8. Pseudophakia OU  - s/p CE/IOL OU (OD Jan 2021; OS Feb 2021  - IOL in good position, doing well  - monitor  9. Dry eyes OU  - large component of BCVA - recommend artificial tears and lubricating ointment as needed  Ophthalmic Meds Ordered this visit:  No orders of the defined types were placed in this encounter.     Return in about 6 months (around 03/25/2021) for f/u PDR with retinal ischemia OU, DFE, OCT.  There are no Patient Instructions on file for this visit.  Explained the diagnoses, plan, and follow up with the patient and they expressed understanding.  Patient expressed understanding of the importance of proper follow up care.  This document serves as a record of services  personally performed by Gardiner Sleeper, MD, PhD. It was created on their behalf by Estill Bakes, COT an ophthalmic technician. The creation of this record is the provider's dictation and/or activities during the visit.    Electronically signed by: Estill Bakes, COT 1.18.22 @ 2:13 PM   This document serves as a record of services personally performed by Gardiner Sleeper, MD, PhD. It was created on their behalf by San Jetty. Owens Shark, OA an ophthalmic technician. The creation of this record is the provider's dictation and/or activities during the visit.    Electronically signed by: San Jetty. Owens Shark, [place] 01.18.2022 2:13 PM  Gardiner Sleeper, M.D., Ph.D. Diseases & Surgery of the Retina and [place] 09/25/2020   I have reviewed the above documentation for accuracy and completeness, and I agree with the above. Gardiner Sleeper, M.D., Ph.D. 09/25/20 2:13 PM   Abbreviations: M myopia (nearsighted); A astigmatism; H hyperopia (farsighted); P presbyopia; Mrx spectacle prescription;  CTL contact lenses; OD right eye; OS left eye; OU both eyes  XT exotropia; ET esotropia; PEK punctate epithelial keratitis; PEE punctate epithelial erosions; DES dry eye syndrome; MGD meibomian gland dysfunction; ATs artificial tears; PFAT's preservative free artificial tears; [place] nuclear sclerotic cataract; PSC posterior subcapsular cataract; ERM epi-retinal membrane; PVD posterior vitreous detachment; RD retinal detachment; DM diabetes mellitus; DR diabetic retinopathy; NPDR non-proliferative diabetic retinopathy; PDR proliferative diabetic retinopathy; CSME clinically significant macular edema; DME diabetic macular edema; dbh dot blot hemorrhages; CWS cotton wool spot; POAG primary open angle glaucoma; C/D cup-to-disc ratio; HVF humphrey visual field; GVF goldmann visual field; OCT optical coherence tomography; IOP intraocular pressure; BRVO Branch retinal vein occlusion; CRVO central retinal vein  occlusion; CRAO central retinal artery occlusion; BRAO branch retinal artery occlusion; RT retinal tear; SB scleral buckle; PPV pars plana vitrectomy; VH Vitreous hemorrhage; PRP panretinal laser photocoagulation; IVK intravitreal kenalog; VMT vitreomacular traction; MH Macular hole;  NVD neovascularization of the disc; NVE neovascularization elsewhere; AREDS age related eye disease study; ARMD age related macular degeneration; POAG primary open angle glaucoma; EBMD epithelial/anterior basement membrane dystrophy; ACIOL anterior chamber intraocular lens; IOL intraocular lens; PCIOL posterior chamber intraocular lens; Phaco/IOL phacoemulsification with intraocular lens placement; [place] photorefractive keratectomy; LASIK laser assisted in situ keratomileusis; HTN hypertension; DM diabetes mellitus; COPD chronic obstructive pulmonary disease

## 2020-09-26 DIAGNOSIS — Z992 Dependence on renal dialysis: Secondary | ICD-10-CM | POA: Diagnosis not present

## 2020-09-26 DIAGNOSIS — N2581 Secondary hyperparathyroidism of renal origin: Secondary | ICD-10-CM | POA: Diagnosis not present

## 2020-09-26 DIAGNOSIS — E1129 Type 2 diabetes mellitus with other diabetic kidney complication: Secondary | ICD-10-CM | POA: Diagnosis not present

## 2020-09-26 DIAGNOSIS — N186 End stage renal disease: Secondary | ICD-10-CM | POA: Diagnosis not present

## 2020-09-27 ENCOUNTER — Other Ambulatory Visit: Payer: Self-pay

## 2020-09-27 ENCOUNTER — Encounter: Payer: Self-pay | Admitting: Physical Medicine & Rehabilitation

## 2020-09-27 ENCOUNTER — Encounter: Payer: Medicare Other | Attending: Physical Medicine & Rehabilitation | Admitting: Physical Medicine & Rehabilitation

## 2020-09-27 VITALS — BP 136/76 | HR 74 | Temp 97.9°F | Ht 70.0 in | Wt 184.2 lb

## 2020-09-27 DIAGNOSIS — R269 Unspecified abnormalities of gait and mobility: Secondary | ICD-10-CM

## 2020-09-27 DIAGNOSIS — G40909 Epilepsy, unspecified, not intractable, without status epilepticus: Secondary | ICD-10-CM | POA: Diagnosis not present

## 2020-09-27 DIAGNOSIS — S065XAA Traumatic subdural hemorrhage with loss of consciousness status unknown, initial encounter: Secondary | ICD-10-CM

## 2020-09-27 DIAGNOSIS — S065X9A Traumatic subdural hemorrhage with loss of consciousness of unspecified duration, initial encounter: Secondary | ICD-10-CM | POA: Insufficient documentation

## 2020-09-27 NOTE — Progress Notes (Signed)
Subjective:    Patient ID: Isaiah [name], male    DOB: 07/17/1966, 55 y.o.   MRN: [id number]  HPI [place] history of T2DM, A fib, ESRD-HD MWF, CVA, DVT/PE s/p IVC filter, seizure disorder, MM with recent admission to [facility] for stem cell transplant but procedure aborted due to findings of proteus mirabilis bacteremia, aspiration PNA, acute on chronic SDH, findings of bilateral renal calculi as well as breakthrough seizures presents for follow up for SDH.  Wife supplements history. Last clinic visit on 06/26/20.  Since that time, pt states he is doing HEP, "not as much" - no reason. He completed OT.  Denies recent seizures. Denies issues with tone. He states he is able to walk without someone at times. He has had falls since last visit, without trauma.   Pain Inventory Average Pain 0 Pain Right Now 0 My pain is no pain  LOCATION OF PAIN  No pain.   BOWEL Number of stools per week: 1-2 Oral laxative use No  Type of laxative  Enema or suppository use No  History of colostomy No  Incontinent No   BLADDER Dialysis In and out cath, frequency  Able to self cath No  Bladder incontinence No  Frequent urination No  Leakage with coughing No  Difficulty starting stream No  Incomplete bladder emptying No    Mobility walk with assistance use a walker ability to climb steps?  yes do you drive?  no use a wheelchair needs help with transfers  Function disabled: date disabled 09/09/2015 I need assistance with the following:  feeding, dressing, bathing and toileting  Neuro/Psych weakness trouble walking  Prior Studies Any changes since last visit?  no  Physicians involved in your care Any changes since last visit?  no   Family History  Problem Relation Age of Onset  . Hypertension Mother   . Hypertension Father   . Stroke Father   . Sickle cell anemia Brother   . Diabetes Mother   . Sickle cell anemia Brother   . Diabetes type I Brother   . Kidney disease Brother     Social History   Socioeconomic History  . Marital status: Married    Spouse name: sylvia  . Number of children: 1  . Years of education: college  . Highest education level: Not on file  Occupational History  . Occupation: [employer]  . Occupation: disability  Tobacco Use  . Smoking status: Never Smoker  . Smokeless tobacco: Never Used  Vaping Use  . Vaping Use: Never used  Substance and Sexual Activity  . Alcohol use: Not Currently  . Drug use: Never  . Sexual activity: Yes    Partners: Female  Other Topics Concern  . Not on file  Social History Narrative   ** Merged History Encounter **       Pt lives in 2 story home with his wife and 1 son   Has masters degree in psychology   Currently disabled.   Right handed       Social Determinants of Health   Financial Resource Strain: Low Risk   . Difficulty of Paying Living Expenses: Not hard at all  Food Insecurity: No Food Insecurity  . Worried About Charity fundraiser in the Last Year: Never true  . Ran Out of Food in the Last Year: Never true  Transportation Needs: No Transportation Needs  . Lack of Transportation (Medical): No  . Lack of Transportation (Non-Medical): No  Physical Activity:  Insufficiently Active  . Days of Exercise per Week: 4 days  . Minutes of Exercise per Session: 30 min  Stress: No Stress Concern Present  . Feeling of Stress : Not at all  Social Connections: Not on file   Past Surgical History:  Procedure Laterality Date  . AV FISTULA PLACEMENT Right 12/05/2015   Procedure: INSERTION OF ARTERIOVENOUS (AV) GORE-TEX GRAFT ARM;  Surgeon: Elam Dutch, MD;  Location: [place];  Service: Vascular;  Laterality: Right;  . BONE MARROW BIOPSY    . CATARACT EXTRACTION Right 09/2019   Dr. Herbert Deaner  . CATARACT EXTRACTION Left 10/2019   Dr. Herbert Deaner  . CHOLECYSTECTOMY    . CHOLECYSTECTOMY  1990's?  . COLONOSCOPY WITH PROPOFOL N/A 01/22/2017   Procedure: COLONOSCOPY WITH PROPOFOL;  Surgeon:  Wilford Corner, MD;  Location: [facility];  Service: Endoscopy;  Laterality: N/A;  . EYE SURGERY Bilateral    Cat Sx - Dr. Herbert Deaner  . INSERTION OF DIALYSIS CATHETER Right 10/16/2015   Procedure: INSERTION OF PALINDROME DIALYSIS CATHETER ;  Surgeon: Elam Dutch, MD;  Location: [place];  Service: Vascular;  Laterality: Right;  . OTHER SURGICAL HISTORY     Retinal surgery  . PARS PLANA VITRECTOMY  02/17/2012   Procedure: PARS PLANA VITRECTOMY WITH 25 GAUGE;  Surgeon: Hayden Pedro, MD;  Location: [place];  Service: Ophthalmology;  Laterality: Right;  . PERIPHERAL VASCULAR CATHETERIZATION N/A 03/20/2016   Procedure: A/V Shuntogram/Fistulagram;  Surgeon: Conrad , MD;  Location: [place] [facility];  Service: Cardiovascular;  Laterality: N/A;  . TEE WITHOUT CARDIOVERSION N/A 12/02/2016   Procedure: TRANSESOPHAGEAL ECHOCARDIOGRAM (TEE);  Surgeon: Larey Dresser, MD;  Location: [place];  Service: Cardiovascular;  Laterality: N/A;   Past Medical History:  Diagnosis Date  . A-fib ([place])   . Anemia   . Asthma   . Diabetic retinopathy ([place])    PDR OU  . DM type 2 (diabetes mellitus, type 2) ([place]) 06/09/2019  . ESRD (end stage renal disease) on dialysis ([place])   . Essential hypertension 06/09/2019  . GIB (gastrointestinal bleeding)    Recurrent episodes- 09/2014, 09/2015 and 05/2016  . Gout   . History of recent blood transfusion 10/27/14   2 Units PRBC's  . Hyperkalemia 07/2011  . Hypertensive retinopathy    OU  . Multiple myeloma ([place])   . OSA on CPAP   . Pulmonary embolism ([place]) 07/2011; 09/27/2014   a. Bilat PE 07/2011 - unclear cause, tx with 6 months Coumadin.;   . Seizure disorder ([place]) 06/09/2019  . Sepsis ([place])   . Sickle cell-thalassemia disease ([place])    a. Sickle cell trait.  . Sleep apnea   . Stroke ([place]) 09/2015   R-MCA, L-MCA, PCA and bilateral cerebellar complicated by DVT/PE  . Subdural hematoma ([facility]) 05/2019   BP 136/76   Pulse 74   Temp 97.9 F (36.6 C)   Ht  _0  (1.778 m)   Wt 184 lb 3.2 oz (83.6 kg)   BMI 26.43 kg/m   Opioid Risk Score:   Fall Risk Score:  `1  Depression screen PHQ 2/9  Depression screen [facility] 2/9 09/27/2020 04/12/2020 03/08/2020 09/22/2019 09/13/2019  Decreased Interest 0 0 0 0 0  Down, Depressed, Hopeless 0 0 0 0 0  PHQ - 2 Score 0 0 0 0 0  Some recent data might be hidden    Review of Systems  Constitutional: Negative.   HENT: Negative.   Eyes: Negative.  Respiratory: Negative.   Cardiovascular: Negative.   Gastrointestinal: Negative.   Endocrine:       High blood sugar   Genitourinary: Negative.   Musculoskeletal: Positive for arthralgias, gait problem and myalgias.  Allergic/Immunologic: Negative.   Neurological: Positive for seizures, speech difficulty and weakness.  Hematological: Negative.   Psychiatric/Behavioral: Negative.   All other systems reviewed and are negative.     Objective:   Physical Exam  Constitutional: No distress . Vital signs reviewed. HENT: Normocephalic.  Atraumatic. Eyes: EOMI. No discharge. Cardiovascular: No JVD.   Respiratory: Normal effort.  No stridor.   GI: Non-distended.   Skin: Warm and dry.  Intact. Psych: Normal mood.  Normal behavior. Musc: No edema in extremities.  No tenderness in extremities. Gait: Short step/stride length with assistance from gait belt Neurologic: Alert HOH Dysarthria, unchanged Motor: RUE: 4+/5 RLE: 4+/5 No tone noted    Assessment & Plan:  Male with history of T2DM, A fib, ESRD-HD MWF, CVA, DVT/PE s/p IVC filter, seizure disorder, MM with recent admission to [facility] for stem cell transplant but procedure aborted due to findings of proteus mirabilis bacteremia, aspiration PNA, acute on chronic SDH, findings of bilateral renal calculi as well as breakthrough seizures presents for follow up for SDH.   1. Impaired ADLs/mobility and function secondary to acute on chronic SDH with 5 mm of midline shift to left- with R hemiparesis and dysarthria              Encouraged HEP  Continue follow up with Neurology  Completed OT  Would like to hold off on more therapies at present   2. Seizure disorder:              Continue follow up with Neurology   3. Spasticity in setting of 2017 CVA with previous R hemiparesis             ?Resolved   4. Gait abnormality with falls             Continue walker for safety,              Continue HEP  Wheelchair/transport chair ordered 05/2020  Completed PT  Would like to hold on revisiting therapies at present  Patient/wife would like to RTC in 6 months

## 2020-09-28 DIAGNOSIS — Z992 Dependence on renal dialysis: Secondary | ICD-10-CM | POA: Diagnosis not present

## 2020-09-28 DIAGNOSIS — N2581 Secondary hyperparathyroidism of renal origin: Secondary | ICD-10-CM | POA: Diagnosis not present

## 2020-09-28 DIAGNOSIS — E1129 Type 2 diabetes mellitus with other diabetic kidney complication: Secondary | ICD-10-CM | POA: Diagnosis not present

## 2020-09-28 DIAGNOSIS — N186 End stage renal disease: Secondary | ICD-10-CM | POA: Diagnosis not present

## 2020-10-01 DIAGNOSIS — E1129 Type 2 diabetes mellitus with other diabetic kidney complication: Secondary | ICD-10-CM | POA: Diagnosis not present

## 2020-10-01 DIAGNOSIS — Z992 Dependence on renal dialysis: Secondary | ICD-10-CM | POA: Diagnosis not present

## 2020-10-01 DIAGNOSIS — N2581 Secondary hyperparathyroidism of renal origin: Secondary | ICD-10-CM | POA: Diagnosis not present

## 2020-10-01 DIAGNOSIS — N186 End stage renal disease: Secondary | ICD-10-CM | POA: Diagnosis not present

## 2020-10-02 ENCOUNTER — Encounter (INDEPENDENT_AMBULATORY_CARE_PROVIDER_SITE_OTHER): Payer: Medicare Other | Admitting: Ophthalmology

## 2020-10-03 DIAGNOSIS — N186 End stage renal disease: Secondary | ICD-10-CM | POA: Diagnosis not present

## 2020-10-03 DIAGNOSIS — E1129 Type 2 diabetes mellitus with other diabetic kidney complication: Secondary | ICD-10-CM | POA: Diagnosis not present

## 2020-10-03 DIAGNOSIS — Z992 Dependence on renal dialysis: Secondary | ICD-10-CM | POA: Diagnosis not present

## 2020-10-03 DIAGNOSIS — N2581 Secondary hyperparathyroidism of renal origin: Secondary | ICD-10-CM | POA: Diagnosis not present

## 2020-10-05 DIAGNOSIS — N186 End stage renal disease: Secondary | ICD-10-CM | POA: Diagnosis not present

## 2020-10-05 DIAGNOSIS — Z992 Dependence on renal dialysis: Secondary | ICD-10-CM | POA: Diagnosis not present

## 2020-10-05 DIAGNOSIS — E1129 Type 2 diabetes mellitus with other diabetic kidney complication: Secondary | ICD-10-CM | POA: Diagnosis not present

## 2020-10-05 DIAGNOSIS — N2581 Secondary hyperparathyroidism of renal origin: Secondary | ICD-10-CM | POA: Diagnosis not present

## 2020-10-08 DIAGNOSIS — E1129 Type 2 diabetes mellitus with other diabetic kidney complication: Secondary | ICD-10-CM | POA: Diagnosis not present

## 2020-10-08 DIAGNOSIS — N2581 Secondary hyperparathyroidism of renal origin: Secondary | ICD-10-CM | POA: Diagnosis not present

## 2020-10-08 DIAGNOSIS — N186 End stage renal disease: Secondary | ICD-10-CM | POA: Diagnosis not present

## 2020-10-08 DIAGNOSIS — Z992 Dependence on renal dialysis: Secondary | ICD-10-CM | POA: Diagnosis not present

## 2020-10-09 DIAGNOSIS — E1122 Type 2 diabetes mellitus with diabetic chronic kidney disease: Secondary | ICD-10-CM | POA: Diagnosis not present

## 2020-10-09 DIAGNOSIS — N186 End stage renal disease: Secondary | ICD-10-CM | POA: Diagnosis not present

## 2020-10-09 DIAGNOSIS — Z992 Dependence on renal dialysis: Secondary | ICD-10-CM | POA: Diagnosis not present

## 2020-10-10 ENCOUNTER — Telehealth: Payer: Medicare Other

## 2020-10-10 DIAGNOSIS — D631 Anemia in chronic kidney disease: Secondary | ICD-10-CM | POA: Diagnosis not present

## 2020-10-10 DIAGNOSIS — Z992 Dependence on renal dialysis: Secondary | ICD-10-CM | POA: Diagnosis not present

## 2020-10-10 DIAGNOSIS — N186 End stage renal disease: Secondary | ICD-10-CM | POA: Diagnosis not present

## 2020-10-10 DIAGNOSIS — N2581 Secondary hyperparathyroidism of renal origin: Secondary | ICD-10-CM | POA: Diagnosis not present

## 2020-10-12 ENCOUNTER — Ambulatory Visit: Payer: Medicare Other

## 2020-10-12 DIAGNOSIS — N186 End stage renal disease: Secondary | ICD-10-CM

## 2020-10-12 DIAGNOSIS — C9000 Multiple myeloma not having achieved remission: Secondary | ICD-10-CM

## 2020-10-12 DIAGNOSIS — Z992 Dependence on renal dialysis: Secondary | ICD-10-CM | POA: Diagnosis not present

## 2020-10-12 DIAGNOSIS — N2581 Secondary hyperparathyroidism of renal origin: Secondary | ICD-10-CM | POA: Diagnosis not present

## 2020-10-12 DIAGNOSIS — D631 Anemia in chronic kidney disease: Secondary | ICD-10-CM | POA: Diagnosis not present

## 2020-10-12 DIAGNOSIS — E1122 Type 2 diabetes mellitus with diabetic chronic kidney disease: Secondary | ICD-10-CM

## 2020-10-12 NOTE — Chronic Care Management (AMB) (Signed)
Chronic Care Management    Social Work Note  10/12/2020 Name: Isaiah [name] [facility] MRN: [id number] DOB: Sep 23, 1965  Isaiah [name] is a 55 y.o. year old male who is a primary care patient of [name], MD. The CCM team was consulted to assist the patient with chronic disease management and/or care coordination needs related to: [employer] .   Engaged with patient spouse Margit Hanks by telephone for follow up visit in response to provider referral for social work chronic care management and care coordination services.   Consent to Services:  The patient was given information about Chronic Care Management services, agreed to services, and gave verbal consent prior to initiation of services.  Please see initial visit note for detailed documentation.   Patient agreed to services and consent obtained.   SW placed a successful outbound call to assess for care coordination needs. No acute SW needs identified at this time.  Assessment: Review of patient past medical history, allergies, medications, and health status, including review of relevant consultants reports was performed today as part of a comprehensive evaluation and provision of chronic care management and care coordination services.     SDOH (Social Determinants of Health) assessments and interventions performed:  SDOH Interventions   Flowsheet Row Most Recent Value  SDOH Interventions   Food Insecurity Interventions Intervention Not Indicated  Housing Interventions Intervention Not Indicated  Transportation Interventions Intervention Not Indicated       Advanced Directives Status: Not addressed in this encounter.  CCM Care Plan  Allergies  Allergen Reactions  . Codeine Swelling  . Codeine Rash and Other (See Comments)    Unknown reaction (patient says it was more serious than just a rash, but he can't remember what happened)     Outpatient Encounter Medications as of 10/12/2020  Medication Sig  . acetaminophen  (TYLENOL) 325 MG tablet Take 1-2 tablets (325-650 mg total) by mouth every 4 (four) hours as needed for mild pain.  [name] acyclovir (ZOVIRAX) 400 MG tablet TAKE 1 TABLET BY MOUTH ONCE DAILY  . albuterol (PROAIR HFA) 108 (90 Base) MCG/ACT inhaler Inhale two puffs every 4-6 hours if needed for cough or wheeze (Patient taking differently: Inhale 2 puffs into the lungs every 4 (four) hours as needed for wheezing or shortness of breath. Inhale two puffs every 4-6 hours if needed for cough or wheeze)  . Alcohol Swabs (SM ALCOHOL PREP) 70 % PADS   . atorvastatin (LIPITOR) 20 MG tablet TAKE 1 TABLET BY MOUTH ONCE A DAY  . AURYXIA 1 GM 210 MG(Fe) tablet Take 210 mg by mouth 3 (three) times daily.  [name] BREO ELLIPTA 200-25 MCG/INH AEPB INHALE 1 PUFF INTO THE LUNGS AT THE SAME TIME EVERY DAY  . calcitRIOL (ROCALTROL) 0.25 MCG capsule 1 capsule  . calcium acetate (PHOSLO) 667 MG capsule Take by mouth.  . camphor-menthol (SARNA) lotion Apply 1 application topically every 8 (eight) hours as needed for itching.  . Cetirizine HCl 10 MG CAPS Take 1 capsule by mouth daily.   . Cholecalciferol (D3-1000 PO) Take by mouth.  . clonazePAM (KLONOPIN) 0.5 MG tablet Take 2 tablets every Monday, Wednesday, and Friday with hemodialysis  . dexamethasone (DECADRON) 4 MG tablet 1 tablet  . dextrose 50 % solution Dextrose 50%  . FREESTYLE LITE test strip   . Glucagon, rDNA, (GLUCAGON EMERGENCY) 1 MG KIT See admin instructions.  . hydrOXYzine (ATARAX/VISTARIL) 25 MG tablet TAKE 1 TABLET BY MOUTH EVERY 8 HOURS AS NEEDED FOR ITCHING  .  Insulin Glargine (BASAGLAR KWIKPEN) 100 UNIT/ML Inject 8 Units into the skin daily.  . Insulin Lispro (HUMALOG KWIKPEN [place]) Inject 2-4 Units into the skin 3 (three) times daily before meals. Sliding scale: 150-250 =2 units, 250 -350=3 units, 350 += 4 units  . lacosamide (VIMPAT) 200 MG TABS tablet Take 1 tablet twice a day except on dialysis days M-W-F give 1 tab in AM, 1 tab after dialysis, 1 tab in PM.   . lamoTRIgine (LAMICTAL) 200 MG tablet Take 1 tablet (200 mg total) by mouth 2 (two) times daily.  . Lancets (FREESTYLE) lancets   . Methoxy PEG-Epoetin Beta (MIRCERA IJ) Mircera  . Methoxy PEG-Epoetin Beta (MIRCERA IJ) Mircera  . metoprolol tartrate (LOPRESSOR) 25 MG tablet Take 25 mg by mouth daily.  . midodrine (PROAMATINE) 10 MG tablet TAKE 1 TABLET BY MOUTH TWICE DAILY WITH MEALS  . multivitamin (RENA-VIT) TABS tablet Take 1 tablet by mouth at bedtime.  . Nutritional Supplements (FEEDING SUPPLEMENT, NEPRO CARB STEADY,) LIQD Take 237 mLs by mouth 3 (three) times daily as needed (Supplement).  . pantoprazole (PROTONIX) 40 MG tablet TAKE 1 TABLET BY MOUTH ONCE DAILY  . prochlorperazine (COMPAZINE) 10 MG tablet TAKE 1 TABLET (10 MG TOTAL) BY MOUTH EVERY 6 (SIX) HOURS AS NEEDED FOR NAUSEA OR VOMITING.  [name] UNIFINE PENTIPS 31G X 8 MM MISC USE AS DIRECTED DAILY AFTER SUPPER   No facility-administered encounter medications on file as of 10/12/2020.    Patient Active Problem List   Diagnosis Date Noted  . Colitis 03/15/2020  . AMS (altered mental status) 10/05/2019  . Acute metabolic encephalopathy 98/92/1194  . Headache, unspecified 09/28/2019  . Abnormality of gait 09/13/2019  . Diabetes mellitus with end-stage renal disease ([place]) 08/07/2019  . Benign hypertensive kidney disease with chronic kidney disease 08/07/2019  . Hypothyroidism, unspecified 07/15/2019  . Spastic hemiparesis ([place]) 07/12/2019  . Dysphagia, oropharyngeal phase 07/05/2019  . Allergy, unspecified, initial encounter 06/27/2019  . PAF (paroxysmal atrial fibrillation) ([place])   . Bacteremia   . Labile blood pressure   . Labile blood glucose   . Controlled type 2 diabetes mellitus with hyperglycemia, without long-term current use of insulin ([place])   . ESRD on dialysis ([place])   . Right sided weakness   . SDH (subdural hematoma) ([place])   . Chronic intracranial subdural hematoma (H[facility]) 06/09/2019  . Multiple myeloma not having  achieved remission ([place]) 06/09/2019  . ESRD on hemodialysis ([place]) 06/09/2019  . Essential hypertension 06/09/2019  . DM type 2 (diabetes mellitus, type 2) ([place]) 06/09/2019  . Seizure disorder ([place]) 06/09/2019  . Aspiration pneumonia ([place]) 06/09/2019  . History of bacteremia 06/09/2019  . Atrial fibrillation, chronic ([place]) 06/09/2019  . Bilateral kidney stones 05/31/2019  . Proteus infection 05/28/2019  . History of restrictive pulmonary disease 04/07/2019  . Multiple myeloma ([place]) 05/17/2018  . Goals of care, counseling/discussion 05/17/2018  . Partial idiopathic epilepsy with seizures of localized onset, intractable, without status epilepticus ([place]) 11/02/2017  . Iron deficiency anemia, unspecified 01/22/2017  . COPD (chronic obstructive pulmonary disease) ([place]) 11/11/2016  . CVA (cerebral vascular accident) ([place]) 11/11/2016  . GERD (gastroesophageal reflux disease) 11/11/2016  . GI bleed 11/11/2016  . 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  . Obesity (BMI 30-39.9) 11/11/2016  . Pulmonary hypertension ([place]) 11/11/2016  . Patent foramen ovale 11/11/2016  . Hyperparathyroidism, secondary renal ([place]) 11/11/2016  . VTE (venous thromboembolism) 11/11/2016  . Hypotension 11/11/2016  . Systemic hypertension 11/11/2016  . Seizure ([place]) 11/11/2016  .  Other seizures ([place]) 07/25/2016  . Encounter for removal of sutures 05/27/2016  . Ingrown nail 03/03/2016  . Onychomycosis due to dermatophyte 03/03/2016  . Other acquired hammer toe 03/03/2016  . Hemiparesis affecting right side as late effect of cerebrovascular accident ([place]) 02/28/2016  . Aphasia complicating stroke 99/24/2683  . Seizures ([place]) 01/08/2016  . History of ischemic left MCA stroke 01/08/2016  . Right sided weakness 01/08/2016  . Atrial fibrillation ([place]) 01/08/2016  . Leukocytosis 01/08/2016  . Benign essential HTN 01/08/2016  . Anemia of chronic disease 01/08/2016  . Controlled type 2 diabetes mellitus with  diabetic nephropathy, without long-term current use of insulin ([place]) 01/08/2016  . History of DVT (deep vein thrombosis) 01/08/2016  . 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 Hypocalcemia 12/26/2015  . Long term (current) use of anticoagulants 11/07/2015  . Aftercare including intermittent dialysis ([place]) 11/02/2015  . Other specified coagulation defects ([place]) 11/02/2015  . Complication of vascular dialysis catheter 11/02/2015  . Diarrhea, unspecified 11/02/2015  . Fever, unspecified 11/02/2015  . Pruritus, unspecified 11/02/2015  . Shortness of breath 11/02/2015  . ESRD (end stage renal disease) on dialysis ([place])     Conditions to be addressed/monitored by CCM team: DMII, ESRD and Multiple Myeloma    Follow Up Plan: No SW follow up planned at this time. The patient will remain engaged with RN Care Manager.      [name], BSW, CDP Social Worker, Certified Dementia Practitioner [place] [facility] [phone]

## 2020-10-15 DIAGNOSIS — N186 End stage renal disease: Secondary | ICD-10-CM | POA: Diagnosis not present

## 2020-10-15 DIAGNOSIS — D631 Anemia in chronic kidney disease: Secondary | ICD-10-CM | POA: Diagnosis not present

## 2020-10-15 DIAGNOSIS — Z992 Dependence on renal dialysis: Secondary | ICD-10-CM | POA: Diagnosis not present

## 2020-10-15 DIAGNOSIS — N2581 Secondary hyperparathyroidism of renal origin: Secondary | ICD-10-CM | POA: Diagnosis not present

## 2020-10-16 ENCOUNTER — Other Ambulatory Visit: Payer: Self-pay | Admitting: Internal Medicine

## 2020-10-16 ENCOUNTER — Telehealth: Payer: Self-pay | Admitting: Neurology

## 2020-10-16 ENCOUNTER — Other Ambulatory Visit: Payer: Self-pay | Admitting: Neurology

## 2020-10-16 ENCOUNTER — Other Ambulatory Visit (HOSPITAL_COMMUNITY): Payer: Self-pay | Admitting: Internal Medicine

## 2020-10-16 MED FILL — clonazePAM 0.5 MG TABS: 0.5 | 35 days supply | Qty: 30 | Fill #2

## 2020-10-16 MED FILL — SUBVENITE 200 MG TABS: 200 | 30 days supply | Qty: 60 | Fill #0

## 2020-10-16 MED FILL — MIDODRINE HCL 10 MG TABS: 10 | 90 days supply | Qty: 180 | Fill #0

## 2020-10-16 NOTE — Telephone Encounter (Signed)
Dr Delice Lesch denied request pt should have refills on file at the pharmacy

## 2020-10-17 ENCOUNTER — Other Ambulatory Visit: Payer: Self-pay | Admitting: Internal Medicine

## 2020-10-17 DIAGNOSIS — N2581 Secondary hyperparathyroidism of renal origin: Secondary | ICD-10-CM | POA: Diagnosis not present

## 2020-10-17 DIAGNOSIS — Z992 Dependence on renal dialysis: Secondary | ICD-10-CM | POA: Diagnosis not present

## 2020-10-17 DIAGNOSIS — N186 End stage renal disease: Secondary | ICD-10-CM | POA: Diagnosis not present

## 2020-10-17 DIAGNOSIS — D631 Anemia in chronic kidney disease: Secondary | ICD-10-CM | POA: Diagnosis not present

## 2020-10-18 ENCOUNTER — Other Ambulatory Visit (HOSPITAL_COMMUNITY): Payer: Self-pay | Admitting: Endocrinology

## 2020-10-19 DIAGNOSIS — Z992 Dependence on renal dialysis: Secondary | ICD-10-CM | POA: Diagnosis not present

## 2020-10-19 DIAGNOSIS — N2581 Secondary hyperparathyroidism of renal origin: Secondary | ICD-10-CM | POA: Diagnosis not present

## 2020-10-19 DIAGNOSIS — D631 Anemia in chronic kidney disease: Secondary | ICD-10-CM | POA: Diagnosis not present

## 2020-10-19 DIAGNOSIS — N186 End stage renal disease: Secondary | ICD-10-CM | POA: Diagnosis not present

## 2020-10-19 MED FILL — VIMPAT 200 MG TABLET: 200 | 37 days supply | Qty: 90 | Fill #2

## 2020-10-22 DIAGNOSIS — Z992 Dependence on renal dialysis: Secondary | ICD-10-CM | POA: Diagnosis not present

## 2020-10-22 DIAGNOSIS — N2581 Secondary hyperparathyroidism of renal origin: Secondary | ICD-10-CM | POA: Diagnosis not present

## 2020-10-22 DIAGNOSIS — N186 End stage renal disease: Secondary | ICD-10-CM | POA: Diagnosis not present

## 2020-10-22 DIAGNOSIS — D631 Anemia in chronic kidney disease: Secondary | ICD-10-CM | POA: Diagnosis not present

## 2020-10-24 DIAGNOSIS — N186 End stage renal disease: Secondary | ICD-10-CM | POA: Diagnosis not present

## 2020-10-24 DIAGNOSIS — D631 Anemia in chronic kidney disease: Secondary | ICD-10-CM | POA: Diagnosis not present

## 2020-10-24 DIAGNOSIS — Z992 Dependence on renal dialysis: Secondary | ICD-10-CM | POA: Diagnosis not present

## 2020-10-24 DIAGNOSIS — N2581 Secondary hyperparathyroidism of renal origin: Secondary | ICD-10-CM | POA: Diagnosis not present

## 2020-10-26 DIAGNOSIS — N186 End stage renal disease: Secondary | ICD-10-CM | POA: Diagnosis not present

## 2020-10-26 DIAGNOSIS — Z992 Dependence on renal dialysis: Secondary | ICD-10-CM | POA: Diagnosis not present

## 2020-10-26 DIAGNOSIS — N2581 Secondary hyperparathyroidism of renal origin: Secondary | ICD-10-CM | POA: Diagnosis not present

## 2020-10-26 DIAGNOSIS — D631 Anemia in chronic kidney disease: Secondary | ICD-10-CM | POA: Diagnosis not present

## 2020-10-29 ENCOUNTER — Telehealth: Payer: Medicare Other

## 2020-10-29 ENCOUNTER — Ambulatory Visit (INDEPENDENT_AMBULATORY_CARE_PROVIDER_SITE_OTHER): Payer: 59

## 2020-10-29 DIAGNOSIS — Z992 Dependence on renal dialysis: Secondary | ICD-10-CM | POA: Diagnosis not present

## 2020-10-29 DIAGNOSIS — C9000 Multiple myeloma not having achieved remission: Secondary | ICD-10-CM

## 2020-10-29 DIAGNOSIS — N2581 Secondary hyperparathyroidism of renal origin: Secondary | ICD-10-CM | POA: Diagnosis not present

## 2020-10-29 DIAGNOSIS — N186 End stage renal disease: Secondary | ICD-10-CM

## 2020-10-29 DIAGNOSIS — D631 Anemia in chronic kidney disease: Secondary | ICD-10-CM | POA: Diagnosis not present

## 2020-10-29 DIAGNOSIS — E1122 Type 2 diabetes mellitus with diabetic chronic kidney disease: Secondary | ICD-10-CM

## 2020-10-31 DIAGNOSIS — N2581 Secondary hyperparathyroidism of renal origin: Secondary | ICD-10-CM | POA: Diagnosis not present

## 2020-10-31 DIAGNOSIS — D631 Anemia in chronic kidney disease: Secondary | ICD-10-CM | POA: Diagnosis not present

## 2020-10-31 DIAGNOSIS — Z992 Dependence on renal dialysis: Secondary | ICD-10-CM | POA: Diagnosis not present

## 2020-10-31 DIAGNOSIS — N186 End stage renal disease: Secondary | ICD-10-CM | POA: Diagnosis not present

## 2020-10-31 NOTE — Chronic Care Management (AMB) (Signed)
Chronic Care Management   CCM RN Visit Note  10/29/2020 Name: Isaiah [name] [facility] MRN: [id number] DOB: 18-May-1966  Subjective: Isaiah [name] [facility] is a 55 y.o. year old male who is a primary care patient of [name], MD. The care management team was consulted for assistance with disease management and care coordination needs.    Engaged with patient by telephone for follow up visit in response to provider referral for case management and/or care coordination services.   Consent to Services:  The patient was given information about Chronic Care Management services, agreed to services, and gave verbal consent prior to initiation of services.  Please see initial visit note for detailed documentation.   Patient agreed to services and verbal consent obtained.   Assessment: Review of patient past medical history, allergies, medications, health status, including review of consultants reports, laboratory and other test data, was performed as part of comprehensive evaluation and provision of chronic care management services.   SDOH (Social Determinants of Health) assessments and interventions performed:  Yes, no acute challenges identified   CCM Care Plan  Allergies  Allergen Reactions  . Codeine Swelling  . Codeine Rash and Other (See Comments)    Unknown reaction (patient says it was more serious than just a rash, but he can't remember what happened)     Outpatient Encounter Medications as of 10/29/2020  Medication Sig  . acetaminophen (TYLENOL) 325 MG tablet Take 1-2 tablets (325-650 mg total) by mouth every 4 (four) hours as needed for mild pain.  [name] acyclovir (ZOVIRAX) 400 MG tablet TAKE 1 TABLET BY MOUTH ONCE DAILY  . albuterol (PROAIR HFA) 108 (90 Base) MCG/ACT inhaler Inhale two puffs every 4-6 hours if needed for cough or wheeze (Patient taking differently: Inhale 2 puffs into the lungs every 4 (four) hours as needed for wheezing or shortness of breath. Inhale two puffs every 4-6 hours if  needed for cough or wheeze)  . Alcohol Swabs (SM ALCOHOL PREP) 70 % PADS   . atorvastatin (LIPITOR) 20 MG tablet TAKE 1 TABLET BY MOUTH ONCE A DAY  . AURYXIA 1 GM 210 MG(Fe) tablet Take 210 mg by mouth 3 (three) times daily.  [name] BREO ELLIPTA 200-25 MCG/INH AEPB INHALE 1 PUFF INTO THE LUNGS AT THE SAME TIME EVERY DAY  . calcitRIOL (ROCALTROL) 0.25 MCG capsule 1 capsule  . calcium acetate (PHOSLO) 667 MG capsule Take by mouth.  . camphor-menthol (SARNA) lotion Apply 1 application topically every 8 (eight) hours as needed for itching.  . Cetirizine HCl 10 MG CAPS Take 1 capsule by mouth daily.   . Cholecalciferol (D3-1000 PO) Take by mouth.  . clonazePAM (KLONOPIN) 0.5 MG tablet Take 2 tablets every Monday, Wednesday, and Friday with hemodialysis  . dexamethasone (DECADRON) 4 MG tablet 1 tablet  . dextrose 50 % solution Dextrose 50%  . FREESTYLE LITE test strip   . Glucagon, rDNA, (GLUCAGON EMERGENCY) 1 MG KIT See admin instructions.  . hydrOXYzine (ATARAX/VISTARIL) 25 MG tablet TAKE 1 TABLET BY MOUTH EVERY 8 HOURS AS NEEDED FOR ITCHING  . Insulin Glargine (BASAGLAR KWIKPEN) 100 UNIT/ML Inject 8 Units into the skin daily.  . Insulin Lispro (HUMALOG KWIKPEN [place]) Inject 2-4 Units into the skin 3 (three) times daily before meals. Sliding scale: 150-250 =2 units, 250 -350=3 units, 350 += 4 units  . lacosamide (VIMPAT) 200 MG TABS tablet Take 1 tablet twice a day except on dialysis days M-W-F give 1 tab in AM, 1 tab after dialysis, 1 tab  in PM.  . lamoTRIgine (LAMICTAL) 200 MG tablet Take 1 tablet (200 mg total) by mouth 2 (two) times daily.  . Lancets (FREESTYLE) lancets   . Methoxy PEG-Epoetin Beta (MIRCERA IJ) Mircera  . Methoxy PEG-Epoetin Beta (MIRCERA IJ) Mircera  . metoprolol tartrate (LOPRESSOR) 25 MG tablet Take 25 mg by mouth daily.  . midodrine (PROAMATINE) 10 MG tablet TAKE 1 TABLET BY MOUTH TWICE DAILY WITH MEALS  . multivitamin (RENA-VIT) TABS tablet Take 1 tablet by mouth at bedtime.   . Nutritional Supplements (FEEDING SUPPLEMENT, NEPRO CARB STEADY,) LIQD Take 237 mLs by mouth 3 (three) times daily as needed (Supplement).  . pantoprazole (PROTONIX) 40 MG tablet TAKE 1 TABLET BY MOUTH ONCE DAILY  . prochlorperazine (COMPAZINE) 10 MG tablet TAKE 1 TABLET (10 MG TOTAL) BY MOUTH EVERY 6 (SIX) HOURS AS NEEDED FOR NAUSEA OR VOMITING.  [name] UNIFINE PENTIPS 31G X 8 MM MISC USE AS DIRECTED DAILY AFTER SUPPER   No facility-administered encounter medications on file as of 10/29/2020.    Patient Active Problem List   Diagnosis Date Noted  . Colitis 03/15/2020  . AMS (altered mental status) 10/05/2019  . Acute metabolic encephalopathy 93/71/6967  . Headache, unspecified 09/28/2019  . Abnormality of gait 09/13/2019  . Diabetes mellitus with end-stage renal disease ([place]) 08/07/2019  . Benign hypertensive kidney disease with chronic kidney disease 08/07/2019  . Hypothyroidism, unspecified 07/15/2019  . Spastic hemiparesis ([place]) 07/12/2019  . Dysphagia, oropharyngeal phase 07/05/2019  . Allergy, unspecified, initial encounter 06/27/2019  . PAF (paroxysmal atrial fibrillation) ([place])   . Bacteremia   . Labile blood pressure   . Labile blood glucose   . Controlled type 2 diabetes mellitus with hyperglycemia, without long-term current use of insulin ([place])   . ESRD on dialysis ([place])   . Right sided weakness   . SDH (subdural hematoma) ([place])   . Chronic intracranial subdural hematoma (HCC) 06/09/2019  . Multiple myeloma not having achieved remission ([place]) 06/09/2019  . ESRD on hemodialysis ([place]) 06/09/2019  . Essential hypertension 06/09/2019  . DM type 2 (diabetes mellitus, type 2) ([place]) 06/09/2019  . Seizure disorder ([place]) 06/09/2019  . Aspiration pneumonia ([place]) 06/09/2019  . History of bacteremia 06/09/2019  . Atrial fibrillation, chronic ([place]) 06/09/2019  . Bilateral kidney stones 05/31/2019  . Proteus infection 05/28/2019  . History of restrictive pulmonary disease 04/07/2019   . Multiple myeloma ([place]) 05/17/2018  . Goals of care, counseling/discussion 05/17/2018  . Partial idiopathic epilepsy with seizures of localized onset, intractable, without status epilepticus ([place]) 11/02/2017  . Iron deficiency anemia, unspecified 01/22/2017  . COPD (chronic obstructive pulmonary disease) ([place]) 11/11/2016  . CVA (cerebral vascular accident) ([place]) 11/11/2016  . GERD (gastroesophageal reflux disease) 11/11/2016  . GI bleed 11/11/2016  . 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  . Obesity (BMI 30-39.9) 11/11/2016  . Pulmonary hypertension ([place]) 11/11/2016  . Patent foramen ovale 11/11/2016  . Hyperparathyroidism, secondary renal ([place]) 11/11/2016  . VTE (venous thromboembolism) 11/11/2016  . Hypotension 11/11/2016  . Systemic hypertension 11/11/2016  . Seizure ([place]) 11/11/2016  . Other seizures ([place]) 07/25/2016  . Encounter for removal of sutures 05/27/2016  . Ingrown nail 03/03/2016  . Onychomycosis due to dermatophyte 03/03/2016  . Other acquired hammer toe 03/03/2016  . Hemiparesis affecting right side as late effect of cerebrovascular accident ([place]) 02/28/2016  . Aphasia complicating stroke 89/38/1017  . Seizures ([place]) 01/08/2016  . History of ischemic left MCA stroke 01/08/2016  . Right sided weakness 01/08/2016  . Atrial  fibrillation ([place]) 01/08/2016  . Leukocytosis 01/08/2016  . Benign essential HTN 01/08/2016  . Anemia of chronic disease 01/08/2016  . Controlled type 2 diabetes mellitus with diabetic nephropathy, without long-term current use of insulin ([place]) 01/08/2016  . History of DVT (deep vein thrombosis) 01/08/2016  . 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 Hypocalcemia 12/26/2015  . Long term (current) use of anticoagulants 11/07/2015  . Aftercare including intermittent dialysis ([place]) 11/02/2015  . Other specified coagulation defects ([place]) 11/02/2015  . Complication of vascular dialysis catheter 11/02/2015  . Diarrhea, unspecified 11/02/2015   . Fever, unspecified 11/02/2015  . Pruritus, unspecified 11/02/2015  . Shortness of breath 11/02/2015  . ESRD (end stage renal disease) on dialysis ([place])     Conditions to be addressed/monitored:DM with ESRD, Multiple Myeoloma  Care Plan : General Plan of Care (Adult)  Updates made by Lynne [place], RN since 10/31/2020 12:00 AM  Completed 10/31/2020  Problem: Cognitive Impairment with change in behavior Resolved 10/31/2020  Priority: High    Long-Range Goal: Manage symptoms of impaired cognition with change in behavior Completed 10/31/2020  Start Date: 09/14/2020  Expected End Date: 11/09/2020  Recent Progress: On track  Priority: High  Note:   Current Barriers:   Ineffective Self Health Maintenance  Unable to self administer medications as prescribed  Unable to perform ADLs independently  Unable to perform IADLs independently  Currently UNABLE TO independently self manage needs related to chronic health conditions.   Knowledge Deficits related to short term plan for care coordination needs and long term plans for chronic disease management needs Nurse Case Manager Clinical Goal(s):   Over the next 90 days, patient will work with care management team to address care coordination and chronic disease management needs related to Disease Management  Educational Needs  Care Coordination  Medication Management and Education  Psychosocial Support  Caregiver Stress support   Interventions:  10/29/20 successful call completed with [name]  . Determined patient's cognition with behavior changes have resolved, wife feels patient is at baseline . Determined wife will report new or worsening symptoms to MD promptly for further evaluation/treatment  . Discussed plans with patient for ongoing care management follow up and provided patient with direct contact information for care management team Patient Goals/Self-Care Activities:  Over the next 60 days, patient/wife will:   -notify MD if symptoms of impaired cognition and or behavior changes persist or worsen -discuss patient's symptoms with Nephrologist and ask about having patient's Aluminum level checked while at HD -Continue to administer medications exactly as prescribed  -keep all scheduled MD follow up appointments    Care Plan : Cancer Treatment Phase (Adult)  Updates made by Lynne [place], RN since 10/31/2020 12:00 AM    Problem: High Risk for Infection   Priority: Medium    Long-Range Goal: Infection Prevented or Managed   Start Date: 10/29/2020  Expected End Date: 04/29/2021  This Visit's Progress: On track  Priority: Medium  Note:    CARE PLAN ENTRY (see longitudinal plan of care for additional care plan information)  Current Barriers:  . Chronic Disease Management support and education needs related to DM with ESRD, Multiple Myeloma Nurse Case Manager Clinical Goal(s):  [name] Over the next 180 days, patient will work with PCP care team and Oncology to address needs related to disease education and support for ongoing treatment of Multiple Myeloma  CCM RN CM Interventions:  10/29/20 completed successful call with [name]  . Inter-disciplinary care team collaboration (see longitudinal plan  of care) . Evaluation of current treatment plan related to Multiple Myeloma and patient's adherence to plan as established by provider . Determined patient is currently under the care of Dr. Alen Blew with [facility]. Medical Oncology with most recent f/u completed on 08/14/20; o Impression and Plan o 55 year old man with:  o 1.  IgG kappa multiple myeloma diagnosed in 2019.  [name] o He is currently on daratumumab maintenance without any major complications.  Protein studies obtained on July 17, 2020 showed no evidence of M spike indicating complete response.  He still has a slight rise in the kappa to lambda ratio on serum light chains.  Risks and benefits of continuing daratumumab versus  discontinuation were discussed at this time.  We have opted to temporarily discontinue daratumumab and reevaluate in 3 months.  Starting salvage therapy will be indicated if he developed relapsed disease.  That would be in the form of possibly Kyprolis with daratumumab potentially. o 2. Anemia: Multifactorial in nature related to his chronic renal insufficiency, plasma cell disorder GI bleeding.  His hemoglobin is close to normal currently at 12.5. o 3.  Antiemetics: No nausea or vomiting reported at this time.  Compazine is available to him. o 4.  Renal failure: He is hemodialysis dependent without any recent issues at this time. o 5. Goals of care:  Aggressive therapy is warranted given his reasonable performance status and treatable disease. o 6. Follow-up: In 3 months for a repeat follow-up. o 30 minutes were spent on this encounter.  Time was dedicated to reviewing laboratory data, disease status update, treatment options and future plan of care review. . Determined wife and patient have a good understanding of the patient's current plan of care related to his Cancer treatment and when to call the doctor if needed   Educated on importance to report fever or signs of infection promptly   Discussed plans with patient for ongoing care management follow up and provided patient with direct contact information for care management team Patient Self Care Activities:  . Self administers medications as prescribed . Attends all scheduled provider appointments . Calls pharmacy for medication refills . Calls provider office for new concerns or questions Next Follow Up Date: 01/21/21  10/29/20 Please see new [place] for updated goal.     Plan:Telephone follow up appointment with care management team member scheduled for:  01/21/21  Barb Merino, RN, BSN, CCM Care Management Coordinator [place] [facility]  Direct Phone: [phone]

## 2020-11-02 DIAGNOSIS — N2581 Secondary hyperparathyroidism of renal origin: Secondary | ICD-10-CM | POA: Diagnosis not present

## 2020-11-02 DIAGNOSIS — Z992 Dependence on renal dialysis: Secondary | ICD-10-CM | POA: Diagnosis not present

## 2020-11-02 DIAGNOSIS — D631 Anemia in chronic kidney disease: Secondary | ICD-10-CM | POA: Diagnosis not present

## 2020-11-02 DIAGNOSIS — N186 End stage renal disease: Secondary | ICD-10-CM | POA: Diagnosis not present

## 2020-11-05 ENCOUNTER — Telehealth: Payer: Self-pay | Admitting: Neurology

## 2020-11-05 DIAGNOSIS — D631 Anemia in chronic kidney disease: Secondary | ICD-10-CM | POA: Diagnosis not present

## 2020-11-05 DIAGNOSIS — Z992 Dependence on renal dialysis: Secondary | ICD-10-CM | POA: Diagnosis not present

## 2020-11-05 DIAGNOSIS — N186 End stage renal disease: Secondary | ICD-10-CM | POA: Diagnosis not present

## 2020-11-05 DIAGNOSIS — N2581 Secondary hyperparathyroidism of renal origin: Secondary | ICD-10-CM | POA: Diagnosis not present

## 2020-11-05 NOTE — Telephone Encounter (Signed)
Pls let her know that I recommend he continue on seizure medication, he is at risk for seizures because of his prior strokes. We had previously discussed that if there are more and more episodes such as the staring, confusion, would do inpatient video EEG stay. Thanks

## 2020-11-05 NOTE — Telephone Encounter (Signed)
Patient's wife called in and wants to see if it is possible for him to taper off of his seizure medication? He has not had one in around 2 years. She also wanted Dr. Delice Lesch to be aware that he will sometimes just stare out in space and he will get up out of bed at night and walk without his walker. The next morning he doesn't remember it and has a headache.

## 2020-11-06 DIAGNOSIS — E1122 Type 2 diabetes mellitus with diabetic chronic kidney disease: Secondary | ICD-10-CM | POA: Diagnosis not present

## 2020-11-06 DIAGNOSIS — Z992 Dependence on renal dialysis: Secondary | ICD-10-CM | POA: Diagnosis not present

## 2020-11-06 DIAGNOSIS — N186 End stage renal disease: Secondary | ICD-10-CM | POA: Diagnosis not present

## 2020-11-06 NOTE — Telephone Encounter (Signed)
Telephone call to the pt wife, Advise pt wife of Note below.  Per the wife pt refuses to do the EEG right now.

## 2020-11-07 DIAGNOSIS — N2581 Secondary hyperparathyroidism of renal origin: Secondary | ICD-10-CM | POA: Diagnosis not present

## 2020-11-07 DIAGNOSIS — N186 End stage renal disease: Secondary | ICD-10-CM | POA: Diagnosis not present

## 2020-11-07 DIAGNOSIS — Z992 Dependence on renal dialysis: Secondary | ICD-10-CM | POA: Diagnosis not present

## 2020-11-07 DIAGNOSIS — D631 Anemia in chronic kidney disease: Secondary | ICD-10-CM | POA: Diagnosis not present

## 2020-11-09 ENCOUNTER — Other Ambulatory Visit (HOSPITAL_COMMUNITY): Payer: Self-pay | Admitting: Endocrinology

## 2020-11-09 DIAGNOSIS — Z992 Dependence on renal dialysis: Secondary | ICD-10-CM | POA: Diagnosis not present

## 2020-11-09 DIAGNOSIS — N186 End stage renal disease: Secondary | ICD-10-CM | POA: Diagnosis not present

## 2020-11-09 DIAGNOSIS — D631 Anemia in chronic kidney disease: Secondary | ICD-10-CM | POA: Diagnosis not present

## 2020-11-09 DIAGNOSIS — N2581 Secondary hyperparathyroidism of renal origin: Secondary | ICD-10-CM | POA: Diagnosis not present

## 2020-11-12 DIAGNOSIS — N2581 Secondary hyperparathyroidism of renal origin: Secondary | ICD-10-CM | POA: Diagnosis not present

## 2020-11-12 DIAGNOSIS — Z992 Dependence on renal dialysis: Secondary | ICD-10-CM | POA: Diagnosis not present

## 2020-11-12 DIAGNOSIS — D631 Anemia in chronic kidney disease: Secondary | ICD-10-CM | POA: Diagnosis not present

## 2020-11-12 DIAGNOSIS — N186 End stage renal disease: Secondary | ICD-10-CM | POA: Diagnosis not present

## 2020-11-13 ENCOUNTER — Other Ambulatory Visit: Payer: Self-pay

## 2020-11-13 ENCOUNTER — Telehealth: Payer: Self-pay

## 2020-11-13 ENCOUNTER — Inpatient Hospital Stay: Payer: Medicare Other | Attending: Oncology

## 2020-11-13 DIAGNOSIS — C9000 Multiple myeloma not having achieved remission: Secondary | ICD-10-CM | POA: Insufficient documentation

## 2020-11-13 DIAGNOSIS — E1065 Type 1 diabetes mellitus with hyperglycemia: Secondary | ICD-10-CM | POA: Diagnosis not present

## 2020-11-13 LAB — CMP (CANCER CENTER ONLY)
ALT: 23 U/L (ref 0–44)
AST: 17 U/L (ref 15–41)
Albumin: 4.6 g/dL (ref 3.5–5.0)
Alkaline Phosphatase: 133 U/L — ABNORMAL HIGH (ref 38–126)
Anion gap: 15 (ref 5–15)
BUN: 37 mg/dL — ABNORMAL HIGH (ref 6–20)
CO2: 30 mmol/L (ref 22–32)
Calcium: 9.1 mg/dL (ref 8.9–10.3)
Chloride: 97 mmol/L — ABNORMAL LOW (ref 98–111)
Creatinine: 8.72 mg/dL (ref 0.61–1.24)
GFR, Estimated: 7 mL/min — ABNORMAL LOW (ref >60)
Glucose, Bld: 212 mg/dL — ABNORMAL HIGH (ref 70–99)
Potassium: 4.3 mmol/L (ref 3.5–5.1)
Sodium: 142 mmol/L (ref 135–145)
Total Bilirubin: 1.3 mg/dL — ABNORMAL HIGH (ref 0.3–1.2)
Total Protein: 7.7 g/dL (ref 6.5–8.1)

## 2020-11-13 LAB — CBC WITH DIFFERENTIAL (CANCER CENTER ONLY)
Abs Immature Granulocytes: 0.03 10*3/uL (ref 0.00–0.07)
Basophils Absolute: 0.2 10*3/uL — ABNORMAL HIGH (ref 0.0–0.1)
Basophils Relative: 1 %
Eosinophils Absolute: 0.6 10*3/uL — ABNORMAL HIGH (ref 0.0–0.5)
Eosinophils Relative: 6 %
Hemoglobin: 11.5 g/dL — ABNORMAL LOW (ref 13.0–17.0)
Immature Granulocytes: 0 %
Lymphocytes Relative: 11 %
Lymphs Abs: 1.1 10*3/uL (ref 0.7–4.0)
Monocytes Absolute: 1.3 10*3/uL — ABNORMAL HIGH (ref 0.1–1.0)
Monocytes Relative: 13 %
Neutro Abs: 7.2 10*3/uL (ref 1.7–7.7)
Neutrophils Relative %: 69 %
Platelet Count: 290 10*3/uL (ref 150–400)
WBC Count: 10.4 10*3/uL (ref 4.0–10.5)

## 2020-11-13 NOTE — Telephone Encounter (Signed)
CRITICAL VALUE STICKER  CRITICAL VALUE: Scr. 8.72  RECEIVER (on-site recipient of call): [place], [place] NOTIFIED: 11/13/2020 @1154   MESSENGER (representative from lab): Rosann Auerbach  MD NOTIFIED: Dr. Zola Button  TIME OF NOTIFICATION: 1156  RESPONSE: MD will address

## 2020-11-14 DIAGNOSIS — D631 Anemia in chronic kidney disease: Secondary | ICD-10-CM | POA: Diagnosis not present

## 2020-11-14 DIAGNOSIS — Z992 Dependence on renal dialysis: Secondary | ICD-10-CM | POA: Diagnosis not present

## 2020-11-14 DIAGNOSIS — N186 End stage renal disease: Secondary | ICD-10-CM | POA: Diagnosis not present

## 2020-11-14 DIAGNOSIS — N2581 Secondary hyperparathyroidism of renal origin: Secondary | ICD-10-CM | POA: Diagnosis not present

## 2020-11-14 LAB — KAPPA/LAMBDA LIGHT CHAINS
Kappa free light chain: 128.4 mg/L — ABNORMAL HIGH (ref 3.3–19.4)
Kappa, lambda light chain ratio: 2.36 — ABNORMAL HIGH (ref 0.26–1.65)
Lambda free light chains: 54.5 mg/L — ABNORMAL HIGH (ref 5.7–26.3)

## 2020-11-16 DIAGNOSIS — Z992 Dependence on renal dialysis: Secondary | ICD-10-CM | POA: Diagnosis not present

## 2020-11-16 DIAGNOSIS — D631 Anemia in chronic kidney disease: Secondary | ICD-10-CM | POA: Diagnosis not present

## 2020-11-16 DIAGNOSIS — N186 End stage renal disease: Secondary | ICD-10-CM | POA: Diagnosis not present

## 2020-11-16 DIAGNOSIS — N2581 Secondary hyperparathyroidism of renal origin: Secondary | ICD-10-CM | POA: Diagnosis not present

## 2020-11-16 LAB — MULTIPLE MYELOMA PANEL, SERUM
Albumin SerPl Elph-Mcnc: 4.2 g/dL (ref 2.9–4.4)
Albumin/Glob SerPl: 1.7 (ref 0.7–1.7)
Alpha 1: 0.3 g/dL (ref 0.0–0.4)
Alpha2 Glob SerPl Elph-Mcnc: 0.8 g/dL (ref 0.4–1.0)
B-Globulin SerPl Elph-Mcnc: 0.8 g/dL (ref 0.7–1.3)
Gamma Glob SerPl Elph-Mcnc: 0.6 g/dL (ref 0.4–1.8)
Globulin, Total: 2.6 g/dL (ref 2.2–3.9)
IgA: 53 mg/dL — ABNORMAL LOW (ref 90–386)
IgG (Immunoglobin G), Serum: 620 mg/dL (ref 603–1613)
IgM (Immunoglobulin M), Srm: 30 mg/dL (ref 20–172)
Total Protein ELP: 6.8 g/dL (ref 6.0–8.5)

## 2020-11-19 ENCOUNTER — Other Ambulatory Visit: Payer: Self-pay | Admitting: Oncology

## 2020-11-19 ENCOUNTER — Telehealth: Payer: Self-pay | Admitting: *Deleted

## 2020-11-19 DIAGNOSIS — N2581 Secondary hyperparathyroidism of renal origin: Secondary | ICD-10-CM | POA: Diagnosis not present

## 2020-11-19 DIAGNOSIS — D631 Anemia in chronic kidney disease: Secondary | ICD-10-CM | POA: Diagnosis not present

## 2020-11-19 DIAGNOSIS — N186 End stage renal disease: Secondary | ICD-10-CM | POA: Diagnosis not present

## 2020-11-19 DIAGNOSIS — Z992 Dependence on renal dialysis: Secondary | ICD-10-CM | POA: Diagnosis not present

## 2020-11-19 MED FILL — SUBVENITE 200 MG TABS: 200 | 30 days supply | Qty: 60 | Fill #1

## 2020-11-19 MED FILL — clonazePAM 0.5 MG TABS: 0.5 | 35 days supply | Qty: 30 | Fill #3

## 2020-11-19 MED FILL — ATORVASTATIN CALCIUM 20 MG: 20 | 90 days supply | Qty: 90 | Fill #1

## 2020-11-19 MED FILL — PANTOPRAZOLE SOD DR 40 MG T: 40 | 90 days supply | Qty: 90 | Fill #1

## 2020-11-19 NOTE — Telephone Encounter (Signed)
See previous note

## 2020-11-19 NOTE — Telephone Encounter (Signed)
Received call from pt's wife. She is calling to have pt's appt on 11/21/20 rescheduled as he has dialysis that day. His HD is Monday, Wednesday and Friday.  Advised that I would send a high priority scheduling message for a Tuesday or Thursday appt.  Scheduling message sent

## 2020-11-20 ENCOUNTER — Other Ambulatory Visit: Payer: Self-pay | Admitting: Oncology

## 2020-11-20 ENCOUNTER — Ambulatory Visit: Payer: 59 | Admitting: Internal Medicine

## 2020-11-20 ENCOUNTER — Telehealth: Payer: Self-pay | Admitting: Oncology

## 2020-11-20 ENCOUNTER — Inpatient Hospital Stay (HOSPITAL_BASED_OUTPATIENT_CLINIC_OR_DEPARTMENT_OTHER): Payer: Medicare Other | Admitting: Oncology

## 2020-11-20 DIAGNOSIS — C9000 Multiple myeloma not having achieved remission: Secondary | ICD-10-CM

## 2020-11-20 NOTE — Progress Notes (Unsigned)
Hematology and Oncology Follow Up for Telemedicine Visits  [place] [id number] 06-Aug-1966 55 y.o. 11/20/2020 11:07 AM [name], MDNo ref. provider found   I connected with@ on 11/20/20 at  by {Blank single:19197::"video enabled telemedicine visit","telephone visit"} and verified that I am speaking with the correct person using two identifiers.   I discussed the limitations, risks, security and privacy concerns of performing an evaluation and management service by telemedicine and the availability of in-person appointments. I also discussed with the patient that there may be a patient responsible charge related to this service. The patient expressed understanding and agreed to proceed.  Other persons participating in the visit and their role in the encounter:  ***  Patient's location:  *** Provider's location:  ***  Chief Complaint:  ***  Principle Diagnosis: ***   Prior Therapy:  Current therapy:  Interim History:  ***   does not report any headaches, blurry vision, syncope or seizures. Does not report any fevers, chills or sweats.  Does not report any cough, wheezing or hemoptysis.  Does not report any chest pain, palpitation, orthopnea or leg edema.  Does not report any nausea, vomiting or abdominal pain.  Does not report any constipation or diarrhea.  Does not report any skeletal complaints.    Does not report frequency, urgency or hematuria.  Does not report any skin rashes or lesions. Does not report any heat or cold intolerance.  Does not report any lymphadenopathy or petechiae.  Does not report any anxiety or depression.  Remaining review of systems is negative.    Medications: {medication reviewed/display:3041432}  Current Outpatient Medications  Medication Sig Dispense Refill  . acetaminophen (TYLENOL) 325 MG tablet Take 1-2 tablets (325-650 mg total) by mouth every 4 (four) hours as needed for mild pain.    [name] acyclovir (ZOVIRAX) 400 MG tablet TAKE 1 TABLET BY  MOUTH ONCE DAILY 90 tablet 0  . albuterol (PROAIR HFA) 108 (90 Base) MCG/ACT inhaler Inhale two puffs every 4-6 hours if needed for cough or wheeze (Patient taking differently: Inhale 2 puffs into the lungs every 4 (four) hours as needed for wheezing or shortness of breath. Inhale two puffs every 4-6 hours if needed for cough or wheeze) 6.7 g 2  . Alcohol Swabs (SM ALCOHOL PREP) 70 % PADS     . atorvastatin (LIPITOR) 20 MG tablet TAKE 1 TABLET BY MOUTH ONCE A DAY 30 tablet 0  . AURYXIA 1 GM 210 MG(Fe) tablet Take 210 mg by mouth 3 (three) times daily.    [name] BREO ELLIPTA 200-25 MCG/INH AEPB INHALE 1 PUFF INTO THE LUNGS AT THE SAME TIME EVERY DAY 60 each 2  . calcitRIOL (ROCALTROL) 0.25 MCG capsule 1 capsule    . calcium acetate (PHOSLO) 667 MG capsule Take by mouth.    . camphor-menthol (SARNA) lotion Apply 1 application topically every 8 (eight) hours as needed for itching. 222 mL 0  . Cetirizine HCl 10 MG CAPS Take 1 capsule by mouth daily.     . Cholecalciferol (D3-1000 PO) Take by mouth.    . clonazePAM (KLONOPIN) 0.5 MG tablet Take 2 tablets every Monday, Wednesday, and Friday with hemodialysis 30 tablet 5  . dexamethasone (DECADRON) 4 MG tablet 1 tablet    . dextrose 50 % solution Dextrose 50%    . FREESTYLE LITE test strip     . Glucagon, rDNA, (GLUCAGON EMERGENCY) 1 MG KIT See admin instructions.    . hydrOXYzine (ATARAX/VISTARIL) 25 MG tablet TAKE 1 TABLET  BY MOUTH EVERY 8 HOURS AS NEEDED FOR ITCHING 30 tablet 0  . Insulin Glargine (BASAGLAR KWIKPEN) 100 UNIT/ML Inject 8 Units into the skin daily. 3 mL 3  . Insulin Lispro (HUMALOG KWIKPEN [place]) Inject 2-4 Units into the skin 3 (three) times daily before meals. Sliding scale: 150-250 =2 units, 250 -350=3 units, 350 += 4 units    . lacosamide (VIMPAT) 200 MG TABS tablet Take 1 tablet twice a day except on dialysis days M-W-F give 1 tab in AM, 1 tab after dialysis, 1 tab in PM. 90 tablet 5  . lamoTRIgine (LAMICTAL) 200 MG tablet Take 1 tablet  (200 mg total) by mouth 2 (two) times daily. 60 tablet 11  . Lancets (FREESTYLE) lancets     . Methoxy PEG-Epoetin Beta (MIRCERA IJ) Mircera    . Methoxy PEG-Epoetin Beta (MIRCERA IJ) Mircera    . metoprolol tartrate (LOPRESSOR) 25 MG tablet Take 25 mg by mouth daily.    . midodrine (PROAMATINE) 10 MG tablet TAKE 1 TABLET BY MOUTH TWICE DAILY WITH MEALS 60 tablet 0  . multivitamin (RENA-VIT) TABS tablet Take 1 tablet by mouth at bedtime.    . Nutritional Supplements (FEEDING SUPPLEMENT, NEPRO CARB STEADY,) LIQD Take 237 mLs by mouth 3 (three) times daily as needed (Supplement). 237 mL 12  . pantoprazole (PROTONIX) 40 MG tablet TAKE 1 TABLET BY MOUTH ONCE DAILY 90 tablet 1  . prochlorperazine (COMPAZINE) 10 MG tablet TAKE 1 TABLET (10 MG TOTAL) BY MOUTH EVERY 6 (SIX) HOURS AS NEEDED FOR NAUSEA OR VOMITING. 30 tablet 0  . UNIFINE PENTIPS 31G X 8 MM MISC USE AS DIRECTED DAILY AFTER SUPPER 100 each 3   No current facility-administered medications for this visit.     Allergies:  Allergies  Allergen Reactions  . Codeine Swelling  . Codeine Rash and Other (See Comments)    Unknown reaction (patient says it was more serious than just a rash, but he can't remember what happened)     Past Medical History, Surgical history, Social history, and Family History were reviewed and updated.  Review of Systems:  Remaining ROS negative.  Physical Exam: There were no vitals taken for this visit. ECOG:  General appearance: alert and cooperative appeared without distress. Head: Normocephalic, without obvious abnormality Oropharynx: No oral thrush or ulcers. Eyes: No scleral icterus.  Pupils are equal and round reactive to light. Lymph nodes: Cervical, supraclavicular, and axillary nodes normal. Heart:regular rate and rhythm, S1, S2 normal, no murmur, click, rub or gallop Lung:chest clear, no wheezing, rales, normal symmetric air entry Abdomin: soft, non-tender, without masses or  organomegaly. Neurological: No motor, sensory deficits.  Intact deep tendon reflexes. Skin: No rashes or lesions.  No ecchymosis or petechiae. Musculoskeletal: No joint deformity or effusion. Psychiatric: Mood and affect are appropriate.    Lab Results: Lab Results  Component Value Date   WBC 10.4 11/13/2020   HGB 11.5 (L) 11/13/2020   HCT RESULTS UNAVAILABLE DUE TO INTERFERING SUBSTANCE 11/13/2020   MCV RESULTS UNAVAILABLE DUE TO INTERFERING SUBSTANCE 11/13/2020   PLT 290 11/13/2020     Chemistry      Component Value Date/Time   NA 142 11/13/2020 1022   NA 141 07/19/2020 0000   NA 138 07/03/2017 0813   K 4.3 11/13/2020 1022   K 4.2 07/03/2017 0813   CL 97 (L) 11/13/2020 1022   CO2 30 11/13/2020 1022   CO2 27 07/03/2017 0813   BUN 37 (H) 11/13/2020 1022   BUN 65 (  A) 08/15/2020 0000   BUN 46.4 (H) 07/03/2017 0813   CREATININE 8.72 (HH) 11/13/2020 1022   CREATININE 10.9 (HH) 07/03/2017 0813   GLU 162 08/15/2020 0000      Component Value Date/Time   CALCIUM 9.1 11/13/2020 1022   CALCIUM 9.3 07/03/2017 0813   ALKPHOS 133 (H) 11/13/2020 1022   ALKPHOS 77 07/03/2017 0813   AST 17 11/13/2020 1022   AST 16 07/03/2017 0813   ALT 23 11/13/2020 1022   ALT 14 07/03/2017 0813   BILITOT 1.3 (H) 11/13/2020 1022   BILITOT 0.93 07/03/2017 0813       Radiological Studies: No results found.   Impression and Plan: ***  I discussed the assessment and treatment plan with the patient. The patient was provided an opportunity to ask questions and all were answered. The patient agreed with the plan and demonstrated an understanding of the instructions.   The patient was advised to call back or seek an in-person evaluation if the symptoms worsen or if the condition fails to improve as anticipated.  I provided *** minutes of {Blank single:19197::"face-to-face video visit time","non face-to-face telephone visit time"} during this encounter, and > 50% was spent counseling as documented  under my assessment & plan.  Zola Button, MD 11/20/2020 11:07 AM

## 2020-11-20 NOTE — Progress Notes (Signed)
Hematology and Oncology Follow Up for Telemedicine Visits  [place] [id number] 25-Apr-1966 55 y.o. 11/20/2020 11:08 AM Isaiah [name], MDSanders, [name], MD   I connected with Isaiah [name] on 11/20/20 at 11:45 AM EDT by telephone visit and verified that I am speaking with the correct person using two identifiers.   I discussed the limitations, risks, security and privacy concerns of performing an evaluation and management service by telemedicine and the availability of in-person appointments. I also discussed with the patient that there may be a patient responsible charge related to this service. The patient expressed understanding and agreed to proceed.  Other persons participating in the visit and their role in the encounter: His wife Isaiah [name]  Patient's location: Home Provider's location: Office    Principle Diagnosis: 55 year old man with IgG kappa multiple myeloma diagnosed in 2019.  Initially presented with MGUS and did not require treatment.   Past therapy: Velcade dexamethasone and cyclophosphamide weekly started on 05/25/2018.  Velcade is given at 1.5 mg/m weekly, dexamethasone is 20 mg weekly and and Cytoxan is given at 650 mg total dose.  Last treatment was given in August 2020.    Therapy was held today in anticipation of high-dose chemotherapy and autologous stem cell transplant.  He did not receive stem cell transplant due to subdural hematoma and sepsis in September 2020.  Velcade at 1.5 mg per metered squared subcutaneously weekly, dexamethasone 20 mg weekly, daratumumab 1800 mg subcutaneous injection.   Cycle 1 started on 08/30/2019.  Therapy concluded in June 2021.  Daratumumab monthly maintenance started in June 2021.  Therapy discontinued in December 2021   Current therapy:   Active surveillance     Interim History: Isaiah [name] reports no major changes in his health.  He continues to receive dialysis without any complications.  He denies any bone pain or  pathological fractures.  He denies any falls or syncope.  He does.  Ambulate with the help of a walker without any recent falls or syncope.  Denies any bone pain or pathological fractures.     Medications: I have reviewed the patient's current medications.  Current Outpatient Medications  Medication Sig Dispense Refill  . acetaminophen (TYLENOL) 325 MG tablet Take 1-2 tablets (325-650 mg total) by mouth every 4 (four) hours as needed for mild pain.    [name] acyclovir (ZOVIRAX) 400 MG tablet TAKE 1 TABLET BY MOUTH ONCE DAILY 90 tablet 0  . albuterol (PROAIR HFA) 108 (90 Base) MCG/ACT inhaler Inhale two puffs every 4-6 hours if needed for cough or wheeze (Patient taking differently: Inhale 2 puffs into the lungs every 4 (four) hours as needed for wheezing or shortness of breath. Inhale two puffs every 4-6 hours if needed for cough or wheeze) 6.7 g 2  . Alcohol Swabs (SM ALCOHOL PREP) 70 % PADS     . atorvastatin (LIPITOR) 20 MG tablet TAKE 1 TABLET BY MOUTH ONCE A DAY 30 tablet 0  . AURYXIA 1 GM 210 MG(Fe) tablet Take 210 mg by mouth 3 (three) times daily.    [name] BREO ELLIPTA 200-25 MCG/INH AEPB INHALE 1 PUFF INTO THE LUNGS AT THE SAME TIME EVERY DAY 60 each 2  . calcitRIOL (ROCALTROL) 0.25 MCG capsule 1 capsule    . calcium acetate (PHOSLO) 667 MG capsule Take by mouth.    . camphor-menthol (SARNA) lotion Apply 1 application topically every 8 (eight) hours as needed for itching. 222 mL 0  . Cetirizine HCl 10 MG CAPS Take 1 capsule by mouth  daily.     . Cholecalciferol (D3-1000 PO) Take by mouth.    . clonazePAM (KLONOPIN) 0.5 MG tablet Take 2 tablets every Monday, Wednesday, and Friday with hemodialysis 30 tablet 5  . dexamethasone (DECADRON) 4 MG tablet 1 tablet    . dextrose 50 % solution Dextrose 50%    . FREESTYLE LITE test strip     . Glucagon, rDNA, (GLUCAGON EMERGENCY) 1 MG KIT See admin instructions.    . hydrOXYzine (ATARAX/VISTARIL) 25 MG tablet TAKE 1 TABLET BY MOUTH EVERY 8 HOURS AS  NEEDED FOR ITCHING 30 tablet 0  . Insulin Glargine (BASAGLAR KWIKPEN) 100 UNIT/ML Inject 8 Units into the skin daily. 3 mL 3  . Insulin Lispro (HUMALOG KWIKPEN [place]) Inject 2-4 Units into the skin 3 (three) times daily before meals. Sliding scale: 150-250 =2 units, 250 -350=3 units, 350 += 4 units    . lacosamide (VIMPAT) 200 MG TABS tablet Take 1 tablet twice a day except on dialysis days M-W-F give 1 tab in AM, 1 tab after dialysis, 1 tab in PM. 90 tablet 5  . lamoTRIgine (LAMICTAL) 200 MG tablet Take 1 tablet (200 mg total) by mouth 2 (two) times daily. 60 tablet 11  . Lancets (FREESTYLE) lancets     . Methoxy PEG-Epoetin Beta (MIRCERA IJ) Mircera    . Methoxy PEG-Epoetin Beta (MIRCERA IJ) Mircera    . metoprolol tartrate (LOPRESSOR) 25 MG tablet Take 25 mg by mouth daily.    . midodrine (PROAMATINE) 10 MG tablet TAKE 1 TABLET BY MOUTH TWICE DAILY WITH MEALS 60 tablet 0  . multivitamin (RENA-VIT) TABS tablet Take 1 tablet by mouth at bedtime.    . Nutritional Supplements (FEEDING SUPPLEMENT, NEPRO CARB STEADY,) LIQD Take 237 mLs by mouth 3 (three) times daily as needed (Supplement). 237 mL 12  . pantoprazole (PROTONIX) 40 MG tablet TAKE 1 TABLET BY MOUTH ONCE DAILY 90 tablet 1  . prochlorperazine (COMPAZINE) 10 MG tablet TAKE 1 TABLET (10 MG TOTAL) BY MOUTH EVERY 6 (SIX) HOURS AS NEEDED FOR NAUSEA OR VOMITING. 30 tablet 0  . UNIFINE PENTIPS 31G X 8 MM MISC USE AS DIRECTED DAILY AFTER SUPPER 100 each 3   No current facility-administered medications for this visit.     Allergies:  Allergies  Allergen Reactions  . Codeine Swelling  . Codeine Rash and Other (See Comments)    Unknown reaction (patient says it was more serious than just a rash, but he can't remember what happened)        Lab Results: Lab Results  Component Value Date   WBC 10.4 11/13/2020   HGB 11.5 (L) 11/13/2020   HCT RESULTS UNAVAILABLE DUE TO INTERFERING SUBSTANCE 11/13/2020   MCV RESULTS UNAVAILABLE DUE TO  INTERFERING SUBSTANCE 11/13/2020   PLT 290 11/13/2020     Chemistry      Component Value Date/Time   NA 142 11/13/2020 1022   NA 141 07/19/2020 0000   NA 138 07/03/2017 0813   K 4.3 11/13/2020 1022   K 4.2 07/03/2017 0813   CL 97 (L) 11/13/2020 1022   CO2 30 11/13/2020 1022   CO2 27 07/03/2017 0813   BUN 37 (H) 11/13/2020 1022   BUN 65 (A) 08/15/2020 0000   BUN 46.4 (H) 07/03/2017 0813   CREATININE 8.72 (HH) 11/13/2020 1022   CREATININE 10.9 (HH) 07/03/2017 0813   GLU 162 08/15/2020 0000      Component Value Date/Time   CALCIUM 9.1 11/13/2020 1022   CALCIUM 9.3  07/03/2017 0813   ALKPHOS 133 (H) 11/13/2020 1022   ALKPHOS 77 07/03/2017 0813   AST 17 11/13/2020 1022   AST 16 07/03/2017 0813   ALT 23 11/13/2020 1022   ALT 14 07/03/2017 0813   BILITOT 1.3 (H) 11/13/2020 1022   BILITOT 0.93 07/03/2017 0813      Results for ASHWATH, LASCH (MRN [id number]) as of 11/20/2020 11:11  Ref. Range 08/14/2020 12:54 11/13/2020 10:22  M Protein SerPl Elph-Mcnc Latest Ref Range: Not Observed g/dL Not Observed Not Observed  IFE 1 Unknown Comment Comment  Globulin, Total Latest Ref Range: 2.2 - 3.9 g/dL 2.6 2.6  B-Globulin SerPl Elph-Mcnc Latest Ref Range: 0.7 - 1.3 g/dL 0.9 0.8  IgG (Immunoglobin G), Serum Latest Ref Range: 603 - 1,613 mg/dL 592 (L) 620  IgM (Immunoglobulin M), Srm Latest Ref Range: 20 - 172 mg/dL 30 30  IgA Latest Ref Range: 90 - 386 mg/dL 39 (L) 53 (L)   Results for DOMINYK, LAW (MRN [id number]) as of 11/20/2020 11:11  Ref. Range 07/17/2020 10:06 08/14/2020 12:54 11/13/2020 10:22  Kappa free light chain Latest Ref Range: 3.3 - 19.4 mg/L 133.2 (H) 105.0 (H) 128.4 (H)  Lamda free light chains Latest Ref Range: 5.7 - 26.3 mg/L 49.9 (H) 48.5 (H) 54.5 (H)  Kappa, lamda light chain ratio Latest Ref Range: 0.26 - 1.65  2.67 (H) 2.16 (H) 2.36 (H)     Impression and Plan:  55 year old man with:  1.    Multiple myeloma arising from MGUS noted in 2019.  He has IgG kappa  subtype.  He is currently on active surveillance after receiving daratumumab maintenance until December 2021.  Laboratory data obtained on November 13, 2020 were personally reviewed and discussed today.  He continues to have excellent response to therapy with normalization of his IgG level, no detected M spike and mild elevation in his kappa to lambda ratio.  Treatment options moving forward were discussed including maintenance therapy, continued active surveillance or salvage treatment he develops relapsed disease.  After discussion today, we will continue with active surveillance at this time and he is agreeable.    2. Anemia:  Related to plasma cell disorder and renal failure.  Hemoglobin is adequate at this time.     3 Follow-up: In 3 months for a repeat follow-up.   I discussed the assessment and treatment plan with the patient. The patient was provided an opportunity to ask questions and all were answered. The patient agreed with the plan and demonstrated an understanding of the instructions.   The patient was advised to call back or seek an in-person evaluation if the symptoms worsen or if the condition fails to improve as anticipated.  I provided 20  minutes of non face-to-face telephone visit time during this encounter.  The time was dedicated to reviewing laboratory data, disease status update and treatment options. Zola Button, MD 11/20/2020 11:08 AM

## 2020-11-20 NOTE — Telephone Encounter (Signed)
Called patient regarding 03/15 appointment, informed patient's wife that this will be a phone visit.

## 2020-11-21 ENCOUNTER — Telehealth: Payer: Self-pay | Admitting: Oncology

## 2020-11-21 ENCOUNTER — Other Ambulatory Visit (HOSPITAL_COMMUNITY): Payer: Self-pay | Admitting: Endocrinology

## 2020-11-21 ENCOUNTER — Inpatient Hospital Stay: Payer: Medicare Other | Admitting: Oncology

## 2020-11-21 DIAGNOSIS — N186 End stage renal disease: Secondary | ICD-10-CM | POA: Diagnosis not present

## 2020-11-21 DIAGNOSIS — Z992 Dependence on renal dialysis: Secondary | ICD-10-CM | POA: Diagnosis not present

## 2020-11-21 DIAGNOSIS — D631 Anemia in chronic kidney disease: Secondary | ICD-10-CM | POA: Diagnosis not present

## 2020-11-21 DIAGNOSIS — N2581 Secondary hyperparathyroidism of renal origin: Secondary | ICD-10-CM | POA: Diagnosis not present

## 2020-11-21 NOTE — Telephone Encounter (Signed)
Left message with follow-up appointments per 3/15 los. Gave option to call back to reschedule if needed.

## 2020-11-23 DIAGNOSIS — Z992 Dependence on renal dialysis: Secondary | ICD-10-CM | POA: Diagnosis not present

## 2020-11-23 DIAGNOSIS — D631 Anemia in chronic kidney disease: Secondary | ICD-10-CM | POA: Diagnosis not present

## 2020-11-23 DIAGNOSIS — N2581 Secondary hyperparathyroidism of renal origin: Secondary | ICD-10-CM | POA: Diagnosis not present

## 2020-11-23 DIAGNOSIS — N186 End stage renal disease: Secondary | ICD-10-CM | POA: Diagnosis not present

## 2020-11-26 DIAGNOSIS — Z992 Dependence on renal dialysis: Secondary | ICD-10-CM | POA: Diagnosis not present

## 2020-11-26 DIAGNOSIS — D631 Anemia in chronic kidney disease: Secondary | ICD-10-CM | POA: Diagnosis not present

## 2020-11-26 DIAGNOSIS — N186 End stage renal disease: Secondary | ICD-10-CM | POA: Diagnosis not present

## 2020-11-26 DIAGNOSIS — N2581 Secondary hyperparathyroidism of renal origin: Secondary | ICD-10-CM | POA: Diagnosis not present

## 2020-11-27 MED FILL — CALCIUM ACETATE 667 MG CAP: 667 | 30 days supply | Qty: 390 | Fill #3

## 2020-11-28 ENCOUNTER — Other Ambulatory Visit: Payer: Self-pay

## 2020-11-28 ENCOUNTER — Telehealth: Payer: Self-pay | Admitting: Neurology

## 2020-11-28 DIAGNOSIS — N2581 Secondary hyperparathyroidism of renal origin: Secondary | ICD-10-CM | POA: Diagnosis not present

## 2020-11-28 DIAGNOSIS — Z992 Dependence on renal dialysis: Secondary | ICD-10-CM | POA: Diagnosis not present

## 2020-11-28 DIAGNOSIS — D631 Anemia in chronic kidney disease: Secondary | ICD-10-CM | POA: Diagnosis not present

## 2020-11-28 DIAGNOSIS — R4182 Altered mental status, unspecified: Secondary | ICD-10-CM

## 2020-11-28 DIAGNOSIS — N186 End stage renal disease: Secondary | ICD-10-CM | POA: Diagnosis not present

## 2020-11-28 DIAGNOSIS — Z8673 Personal history of transient ischemic attack (TIA), and cerebral infarction without residual deficits: Secondary | ICD-10-CM

## 2020-11-28 NOTE — Telephone Encounter (Signed)
Spoke with pt wife they would to do the inpatient EEG.  They have been advised that at this time they are not doing them due to covid that they should be starting back next month they would like to be added to the list so when they start back he can be admitted. Pt ALOC is no better he is smelling candles when they are not burning and asking for his gun.

## 2020-11-28 NOTE — Telephone Encounter (Signed)
Recommend securing gun so he does not have access it. I will send a message to Dr. Hortense Ramal and Levada Dy as well, thanks

## 2020-11-28 NOTE — Telephone Encounter (Signed)
Patient's wife called in wanting to speak with Dr. Amparo Bristol nurse.

## 2020-11-30 DIAGNOSIS — N2581 Secondary hyperparathyroidism of renal origin: Secondary | ICD-10-CM | POA: Diagnosis not present

## 2020-11-30 DIAGNOSIS — Z992 Dependence on renal dialysis: Secondary | ICD-10-CM | POA: Diagnosis not present

## 2020-11-30 DIAGNOSIS — N186 End stage renal disease: Secondary | ICD-10-CM | POA: Diagnosis not present

## 2020-11-30 DIAGNOSIS — D631 Anemia in chronic kidney disease: Secondary | ICD-10-CM | POA: Diagnosis not present

## 2020-12-03 DIAGNOSIS — Z992 Dependence on renal dialysis: Secondary | ICD-10-CM | POA: Diagnosis not present

## 2020-12-03 DIAGNOSIS — N2581 Secondary hyperparathyroidism of renal origin: Secondary | ICD-10-CM | POA: Diagnosis not present

## 2020-12-03 DIAGNOSIS — D631 Anemia in chronic kidney disease: Secondary | ICD-10-CM | POA: Diagnosis not present

## 2020-12-03 DIAGNOSIS — N186 End stage renal disease: Secondary | ICD-10-CM | POA: Diagnosis not present

## 2020-12-03 MED FILL — BREO ELLIPTA 200-25 MCG INH: 200-25 | 30 days supply | Qty: 60 | Fill #0

## 2020-12-04 ENCOUNTER — Telehealth: Payer: Self-pay | Admitting: Neurology

## 2020-12-04 ENCOUNTER — Telehealth: Payer: Self-pay | Admitting: *Deleted

## 2020-12-04 ENCOUNTER — Other Ambulatory Visit (HOSPITAL_COMMUNITY): Payer: Self-pay | Admitting: Physician Assistant

## 2020-12-04 MED FILL — AURYXIA 210 MG TABLET: 1 GM | 90 days supply | Qty: 270 | Fill #0

## 2020-12-04 NOTE — Telephone Encounter (Signed)
-----   Message from Wyatt Portela, MD sent at 12/04/2020  9:48 AM EDT ----- Regarding: RE: Patient concern I doubt acyclovir is contributing but she can discontinue in any case.  He does not needed at this time. ----- Message ----- From: Rolene Course, RN Sent: 12/04/2020   9:46 AM EDT To: Wyatt Portela, MD Subject: Patient concern                                Received phone call from patient's wife regarding medication concern, he has been having hallucinations since October 2021.  They were occasional but she states the hallucinations have become constant.  She is thinking it could be from his acyclovir (she looked up possible side effects).  His wife has called his neurologist but wants to hear your thoughts before they actually make an appointment. Please advise, thanks.

## 2020-12-04 NOTE — Telephone Encounter (Signed)
Patient's wife called in and left a message with access nurse regarding questions she wants to speak with someone about regarding the patient's EEG.

## 2020-12-04 NOTE — Telephone Encounter (Signed)
Pt wife wanted to know if we will call them to schedule the EEG or will [facility] call them to schedule the visit.   Spoke to [employer], Pt will get a call from [facility] in regards to the EEG referral.

## 2020-12-04 NOTE — Telephone Encounter (Signed)
Please see message below.  Called & informed patient's wife of Dr. Hazeline Junker response, instructed her to discontinue the acyclovir.  Also instructed her to F/U with patient's neurologist or to take patient to ED if either he or she feels unsafe due to hallucinations.  Wife verbalizes understanding.  All questions answered during the phone call.

## 2020-12-05 DIAGNOSIS — N2581 Secondary hyperparathyroidism of renal origin: Secondary | ICD-10-CM | POA: Diagnosis not present

## 2020-12-05 DIAGNOSIS — Z992 Dependence on renal dialysis: Secondary | ICD-10-CM | POA: Diagnosis not present

## 2020-12-05 DIAGNOSIS — D631 Anemia in chronic kidney disease: Secondary | ICD-10-CM | POA: Diagnosis not present

## 2020-12-05 DIAGNOSIS — N186 End stage renal disease: Secondary | ICD-10-CM | POA: Diagnosis not present

## 2020-12-07 DIAGNOSIS — E1122 Type 2 diabetes mellitus with diabetic chronic kidney disease: Secondary | ICD-10-CM | POA: Diagnosis not present

## 2020-12-07 DIAGNOSIS — D631 Anemia in chronic kidney disease: Secondary | ICD-10-CM | POA: Diagnosis not present

## 2020-12-07 DIAGNOSIS — N186 End stage renal disease: Secondary | ICD-10-CM | POA: Diagnosis not present

## 2020-12-07 DIAGNOSIS — Z992 Dependence on renal dialysis: Secondary | ICD-10-CM | POA: Diagnosis not present

## 2020-12-07 DIAGNOSIS — N2581 Secondary hyperparathyroidism of renal origin: Secondary | ICD-10-CM | POA: Diagnosis not present

## 2020-12-10 DIAGNOSIS — D631 Anemia in chronic kidney disease: Secondary | ICD-10-CM | POA: Diagnosis not present

## 2020-12-10 DIAGNOSIS — N186 End stage renal disease: Secondary | ICD-10-CM | POA: Diagnosis not present

## 2020-12-10 DIAGNOSIS — N2581 Secondary hyperparathyroidism of renal origin: Secondary | ICD-10-CM | POA: Diagnosis not present

## 2020-12-10 DIAGNOSIS — Z992 Dependence on renal dialysis: Secondary | ICD-10-CM | POA: Diagnosis not present

## 2020-12-11 ENCOUNTER — Other Ambulatory Visit (HOSPITAL_COMMUNITY): Payer: Self-pay

## 2020-12-12 DIAGNOSIS — N186 End stage renal disease: Secondary | ICD-10-CM | POA: Diagnosis not present

## 2020-12-12 DIAGNOSIS — Z992 Dependence on renal dialysis: Secondary | ICD-10-CM | POA: Diagnosis not present

## 2020-12-12 DIAGNOSIS — N2581 Secondary hyperparathyroidism of renal origin: Secondary | ICD-10-CM | POA: Diagnosis not present

## 2020-12-12 DIAGNOSIS — D631 Anemia in chronic kidney disease: Secondary | ICD-10-CM | POA: Diagnosis not present

## 2020-12-13 ENCOUNTER — Other Ambulatory Visit (HOSPITAL_COMMUNITY): Payer: Self-pay

## 2020-12-13 DIAGNOSIS — Z9889 Other specified postprocedural states: Secondary | ICD-10-CM | POA: Diagnosis not present

## 2020-12-13 DIAGNOSIS — H26492 Other secondary cataract, left eye: Secondary | ICD-10-CM | POA: Diagnosis not present

## 2020-12-13 DIAGNOSIS — E113593 Type 2 diabetes mellitus with proliferative diabetic retinopathy without macular edema, bilateral: Secondary | ICD-10-CM | POA: Diagnosis not present

## 2020-12-13 DIAGNOSIS — H209 Unspecified iridocyclitis: Secondary | ICD-10-CM | POA: Diagnosis not present

## 2020-12-13 LAB — HM DIABETES EYE EXAM

## 2020-12-13 MED ORDER — PREDNISOLONE ACETATE 1 % OP SUSP
OPHTHALMIC | 1 refills | Status: DC
  Filled 2020-12-13: qty 10, 50d supply, fill #0

## 2020-12-14 DIAGNOSIS — D631 Anemia in chronic kidney disease: Secondary | ICD-10-CM | POA: Diagnosis not present

## 2020-12-14 DIAGNOSIS — N186 End stage renal disease: Secondary | ICD-10-CM | POA: Diagnosis not present

## 2020-12-14 DIAGNOSIS — E1065 Type 1 diabetes mellitus with hyperglycemia: Secondary | ICD-10-CM | POA: Diagnosis not present

## 2020-12-14 DIAGNOSIS — N2581 Secondary hyperparathyroidism of renal origin: Secondary | ICD-10-CM | POA: Diagnosis not present

## 2020-12-14 DIAGNOSIS — Z992 Dependence on renal dialysis: Secondary | ICD-10-CM | POA: Diagnosis not present

## 2020-12-17 DIAGNOSIS — N186 End stage renal disease: Secondary | ICD-10-CM | POA: Diagnosis not present

## 2020-12-17 DIAGNOSIS — N2581 Secondary hyperparathyroidism of renal origin: Secondary | ICD-10-CM | POA: Diagnosis not present

## 2020-12-17 DIAGNOSIS — D631 Anemia in chronic kidney disease: Secondary | ICD-10-CM | POA: Diagnosis not present

## 2020-12-17 DIAGNOSIS — Z992 Dependence on renal dialysis: Secondary | ICD-10-CM | POA: Diagnosis not present

## 2020-12-19 DIAGNOSIS — N2581 Secondary hyperparathyroidism of renal origin: Secondary | ICD-10-CM | POA: Diagnosis not present

## 2020-12-19 DIAGNOSIS — Z992 Dependence on renal dialysis: Secondary | ICD-10-CM | POA: Diagnosis not present

## 2020-12-19 DIAGNOSIS — N186 End stage renal disease: Secondary | ICD-10-CM | POA: Diagnosis not present

## 2020-12-19 DIAGNOSIS — D631 Anemia in chronic kidney disease: Secondary | ICD-10-CM | POA: Diagnosis not present

## 2020-12-19 DIAGNOSIS — E1129 Type 2 diabetes mellitus with other diabetic kidney complication: Secondary | ICD-10-CM | POA: Diagnosis not present

## 2020-12-20 DIAGNOSIS — B351 Tinea unguium: Secondary | ICD-10-CM | POA: Diagnosis not present

## 2020-12-20 DIAGNOSIS — L600 Ingrowing nail: Secondary | ICD-10-CM | POA: Diagnosis not present

## 2020-12-20 DIAGNOSIS — M2042 Other hammer toe(s) (acquired), left foot: Secondary | ICD-10-CM | POA: Diagnosis not present

## 2020-12-20 DIAGNOSIS — M2041 Other hammer toe(s) (acquired), right foot: Secondary | ICD-10-CM | POA: Diagnosis not present

## 2020-12-21 DIAGNOSIS — N2581 Secondary hyperparathyroidism of renal origin: Secondary | ICD-10-CM | POA: Diagnosis not present

## 2020-12-21 DIAGNOSIS — N186 End stage renal disease: Secondary | ICD-10-CM | POA: Diagnosis not present

## 2020-12-21 DIAGNOSIS — Z992 Dependence on renal dialysis: Secondary | ICD-10-CM | POA: Diagnosis not present

## 2020-12-21 DIAGNOSIS — D631 Anemia in chronic kidney disease: Secondary | ICD-10-CM | POA: Diagnosis not present

## 2020-12-24 DIAGNOSIS — D631 Anemia in chronic kidney disease: Secondary | ICD-10-CM | POA: Diagnosis not present

## 2020-12-24 DIAGNOSIS — Z992 Dependence on renal dialysis: Secondary | ICD-10-CM | POA: Diagnosis not present

## 2020-12-24 DIAGNOSIS — N2581 Secondary hyperparathyroidism of renal origin: Secondary | ICD-10-CM | POA: Diagnosis not present

## 2020-12-24 DIAGNOSIS — N186 End stage renal disease: Secondary | ICD-10-CM | POA: Diagnosis not present

## 2020-12-26 DIAGNOSIS — D631 Anemia in chronic kidney disease: Secondary | ICD-10-CM | POA: Diagnosis not present

## 2020-12-26 DIAGNOSIS — N186 End stage renal disease: Secondary | ICD-10-CM | POA: Diagnosis not present

## 2020-12-26 DIAGNOSIS — N2581 Secondary hyperparathyroidism of renal origin: Secondary | ICD-10-CM | POA: Diagnosis not present

## 2020-12-26 DIAGNOSIS — Z992 Dependence on renal dialysis: Secondary | ICD-10-CM | POA: Diagnosis not present

## 2020-12-27 ENCOUNTER — Other Ambulatory Visit: Payer: Self-pay

## 2020-12-27 ENCOUNTER — Ambulatory Visit (INDEPENDENT_AMBULATORY_CARE_PROVIDER_SITE_OTHER): Payer: Medicare Other | Admitting: Internal Medicine

## 2020-12-27 ENCOUNTER — Encounter: Payer: Self-pay | Admitting: Internal Medicine

## 2020-12-27 ENCOUNTER — Other Ambulatory Visit: Payer: Self-pay | Admitting: Neurology

## 2020-12-27 ENCOUNTER — Other Ambulatory Visit (HOSPITAL_COMMUNITY): Payer: Self-pay

## 2020-12-27 VITALS — BP 118/72 | HR 71 | Temp 98.1°F | Ht 70.0 in | Wt 184.0 lb

## 2020-12-27 DIAGNOSIS — N186 End stage renal disease: Secondary | ICD-10-CM

## 2020-12-27 DIAGNOSIS — Z23 Encounter for immunization: Secondary | ICD-10-CM

## 2020-12-27 DIAGNOSIS — E1122 Type 2 diabetes mellitus with diabetic chronic kidney disease: Secondary | ICD-10-CM

## 2020-12-27 DIAGNOSIS — Z992 Dependence on renal dialysis: Secondary | ICD-10-CM

## 2020-12-27 DIAGNOSIS — I272 Pulmonary hypertension, unspecified: Secondary | ICD-10-CM

## 2020-12-27 DIAGNOSIS — G8110 Spastic hemiplegia affecting unspecified side: Secondary | ICD-10-CM | POA: Diagnosis not present

## 2020-12-27 DIAGNOSIS — R442 Other hallucinations: Secondary | ICD-10-CM | POA: Diagnosis not present

## 2020-12-27 DIAGNOSIS — I480 Paroxysmal atrial fibrillation: Secondary | ICD-10-CM

## 2020-12-27 DIAGNOSIS — N2581 Secondary hyperparathyroidism of renal origin: Secondary | ICD-10-CM | POA: Diagnosis not present

## 2020-12-27 DIAGNOSIS — I120 Hypertensive chronic kidney disease with stage 5 chronic kidney disease or end stage renal disease: Secondary | ICD-10-CM

## 2020-12-27 MED FILL — Lamotrigine Tab 200 MG: ORAL | 30 days supply | Qty: 60 | Fill #0 | Status: AC

## 2020-12-27 NOTE — Patient Instructions (Signed)
Diabetes Mellitus and Exercise Exercising regularly is important for overall health, especially for people who have diabetes mellitus. Exercising is not only about losing weight. It has many other health benefits, such as increasing muscle strength and bone density and reducing body fat and stress. This leads to improved fitness, flexibility, and endurance, all of which result in better overall health. What are the benefits of exercise if I have diabetes? Exercise has many benefits for people with diabetes. They include:  Helping to lower and control blood sugar (glucose).  Helping the body to respond better to the hormone insulin by improving insulin sensitivity.  Reducing how much insulin the body needs.  Lowering the risk for heart disease by: ? Lowering "bad" cholesterol and triglyceride levels. ? Increasing "good" cholesterol levels. ? Lowering blood pressure. ? Lowering blood glucose levels. What is my activity plan? Your health care provider or certified diabetes educator can help you make a plan for the type and frequency of exercise that works for you. This is called your activity plan. Be sure to:  Get at least 150 minutes of medium-intensity or high-intensity exercise each week. Exercises may include brisk walking, biking, or water aerobics.  Do stretching and strengthening exercises, such as yoga or weight lifting, at least 2 times a week.  Spread out your activity over at least 3 days of the week.  Get some form of physical activity each day. ? Do not go more than 2 days in a row without some kind of physical activity. ? Avoid being inactive for more than 90 minutes at a time. Take frequent breaks to walk or stretch.  Choose exercises or activities that you enjoy. Set realistic goals.  Start slowly and gradually increase your exercise intensity over time.   How do I manage my diabetes during exercise? Monitor your blood glucose  Check your blood glucose before and  after exercising. If your blood glucose is: ? 240 mg/dL (13.3 mmol/L) or higher before you exercise, check your urine for ketones. These are chemicals created by the liver. If you have ketones in your urine, do not exercise until your blood glucose returns to normal. ? 100 mg/dL (5.6 mmol/L) or lower, eat a snack containing 15-20 grams of carbohydrate. Check your blood glucose 15 minutes after the snack to make sure that your glucose level is above 100 mg/dL (5.6 mmol/L) before you start your exercise.  Know the symptoms of low blood glucose (hypoglycemia) and how to treat it. Your risk for hypoglycemia increases during and after exercise. Follow these tips and your health care provider's instructions  Keep a carbohydrate snack that is fast-acting for use before, during, and after exercise to help prevent or treat hypoglycemia.  Avoid injecting insulin into areas of the body that are going to be exercised. For example, avoid injecting insulin into: ? Your arms, when you are about to play tennis. ? Your legs, when you are about to go jogging.  Keep records of your exercise habits. Doing this can help you and your health care provider adjust your diabetes management plan as needed. Write down: ? Food that you eat before and after you exercise. ? Blood glucose levels before and after you exercise. ? The type and amount of exercise you have done.  Work with your health care provider when you start a new exercise or activity. He or she may need to: ? Make sure that the activity is safe for you. ? Adjust your insulin, other medicines, and food that   you eat.  Drink plenty of water while you exercise. This prevents loss of water (dehydration) and problems caused by a lot of heat in the body (heat stroke).   Where to find more information  American Diabetes Association: www.diabetes.org Summary  Exercising regularly is important for overall health, especially for people who have diabetes  mellitus.  Exercising has many health benefits. It increases muscle strength and bone density and reduces body fat and stress. It also lowers and controls blood glucose.  Your health care provider or certified diabetes educator can help you make an activity plan for the type and frequency of exercise that works for you.  Work with your health care provider to make sure any new activity is safe for you. Also work with your health care provider to adjust your insulin, other medicines, and the food you eat. This information is not intended to replace advice given to you by your health care provider. Make sure you discuss any questions you have with your health care provider. Document Revised: 05/23/2019 Document Reviewed: 05/23/2019 Elsevier Patient Education  2021 [employer]  

## 2020-12-27 NOTE — Progress Notes (Signed)
I,Katawbba Wiggins,acting as a Education administrator for Isaiah Greenland, MD.,have documented all relevant documentation on the behalf of Isaiah Greenland, MD,as directed by  Isaiah Greenland, MD while in the presence of Isaiah Greenland, MD.  This visit occurred during the SARS-CoV-2 public health emergency.  Safety protocols were in place, including screening questions prior to the visit, additional usage of staff PPE, and extensive cleaning of exam room while observing appropriate contact time as indicated for disinfecting solutions.  Subjective:     Patient ID: Isaiah [name] , male    DOB: [birth year] , 55 y.o.   MRN: [id number]   Chief Complaint  Patient presents with  . Diabetes  . Hypertension    HPI  He presents today for diabetes f/u. He is also followed by Dr. Chalmers Cater. He is accompanied by his wife today.   Diabetes He presents for his follow-up diabetic visit. He has type 2 diabetes mellitus. His disease course has been stable. There are no hypoglycemic associated symptoms. Pertinent negatives for diabetes include no blurred vision. There are no hypoglycemic complications. Diabetic complications include nephropathy. Risk factors for coronary artery disease include diabetes mellitus, dyslipidemia, hypertension, male sex and obesity. He is following a diabetic diet. Eye exam is current.  Hypertension This is a chronic problem. The current episode started more than 1 year ago. The problem has been gradually improving since onset. The problem is controlled. Pertinent negatives include no anxiety or blurred vision. The current treatment provides moderate improvement.     Past Medical History:  Diagnosis Date  . A-fib ([place])   . Anemia   . Diabetic retinopathy ([place])    PDR OU  . DM type 2 (diabetes mellitus, type 2) ([place]) 06/09/2019  . ESRD (end stage renal disease) on dialysis ([place])   . Essential hypertension 06/09/2019  . GIB (gastrointestinal bleeding)    Recurrent episodes- 09/2014, 09/2015 and  05/2016  . Gout   . History of recent blood transfusion 10/27/14   2 Units PRBC's  . Hyperkalemia 07/2011  . Hypertensive retinopathy    OU  . Multiple myeloma ([place])   . Pulmonary embolism ([place]) 07/2011; 09/27/2014   a. Bilat PE 07/2011 - unclear cause, tx with 6 months Coumadin.;   . Seizure disorder ([place]) 06/09/2019  . Sepsis ([place])   . Sickle cell-thalassemia disease ([place])    a. Sickle cell trait.  . Stroke ([place]) 09/2015   R-MCA, L-MCA, PCA and bilateral cerebellar complicated by DVT/PE  . Subdural hematoma ([place]) 05/2019     Family History  Problem Relation Age of Onset  . Hypertension Mother   . Hypertension Father   . Stroke Father   . Sickle cell anemia Brother   . Diabetes Mother   . Sickle cell anemia Brother   . Diabetes type I Brother   . Kidney disease Brother      Current Outpatient Medications:  .  acetaminophen (TYLENOL) 325 MG tablet, Take 1-2 tablets (325-650 mg total) by mouth every 4 (four) hours as needed for mild pain., Disp:  , Rfl:  .  albuterol (PROAIR HFA) 108 (90 Base) MCG/ACT inhaler, Inhale two puffs every 4-6 hours if needed for cough or wheeze (Patient taking differently: Inhale 2 puffs into the lungs every 4 (four) hours as needed for wheezing or shortness of breath. Inhale two puffs every 4-6 hours if needed for cough or wheeze), Disp: 6.7 g, Rfl: 2 .  Alcohol Swabs (SM ALCOHOL PREP) 70 % PADS, , Disp: ,  Rfl:  .  atorvastatin (LIPITOR) 20 MG tablet, TAKE 1 TABLET BY MOUTH ONCE A DAY (Patient taking differently: Take 20 mg by mouth daily.), Disp: 90 tablet, Rfl: 2 .  AURYXIA 1 GM 210 MG(Fe) tablet, Take 210 mg by mouth every evening., Disp: , Rfl:  .  calcitRIOL (ROCALTROL) 0.25 MCG capsule, Take 0.25 mcg by mouth daily., Disp: , Rfl:  .  calcium acetate (PHOSLO) 667 MG capsule, TAKE 3 CAPSULES BY MOUTH THREE TIMES DAILY WITH MEALS AND 2 CAPSULES TWO TIMES DAILY WITH SNACKS (Patient taking differently: Take 667 mg by mouth 3 (three) times daily with  meals.), Disp: 390 capsule, Rfl: 10 .  camphor-menthol (SARNA) lotion, Apply 1 application topically every 8 (eight) hours as needed for itching., Disp: 222 mL, Rfl: 0 .  Cetirizine HCl 10 MG CAPS, Take 1 capsule by mouth daily. , Disp: , Rfl:  .  dexamethasone (DECADRON) 4 MG tablet, Take 4 mg by mouth daily., Disp: , Rfl:  .  dextrose 50 % solution, Dextrose 50%, Disp: , Rfl:  .  FREESTYLE LITE test strip, , Disp: , Rfl:  .  Glucagon, rDNA, (GLUCAGON EMERGENCY) 1 MG KIT, See admin instructions., Disp: , Rfl:  .  Insulin Glargine (BASAGLAR KWIKPEN) 100 UNIT/ML, INJECT 6 UNITS UNDER THE SKIN ONCE A DAY (DISCARD 28 DAYS AFTER FIRST USE) (Patient taking differently: Inject 6 Units into the skin daily.), Disp: 3 mL, Rfl: 6 .  Insulin Lispro (HUMALOG KWIKPEN [place]), Inject 2-4 Units into the skin 3 (three) times daily before meals. Sliding scale: 150-250 =2 units, 250 -350=3 units, 350 += 4 units, Disp: , Rfl:  .  lamoTRIgine (LAMICTAL) 200 MG tablet, TAKE 1 TABLET BY MOUTH 2 TIMES DAILY, Disp: 60 tablet, Rfl: 11 .  midodrine (PROAMATINE) 10 MG tablet, TAKE 1 TABLET BY MOUTH 2 TIMES DAILY WITH MEALS (Patient taking differently: Take 10-20 mg by mouth See admin instructions. Take one tablet by mouth daily except on dialysis days take one extra dose), Disp: 60 tablet, Rfl: 0 .  multivitamin (RENA-VIT) TABS tablet, Take 1 tablet by mouth at bedtime., Disp: , Rfl:  .  pantoprazole (PROTONIX) 40 MG tablet, TAKE 1 TABLET BY MOUTH ONCE DAILY, Disp: 90 tablet, Rfl: 1 .  prednisoLONE acetate (PRED FORTE) 1 % ophthalmic suspension, Instill 1 drop Right Eye 3 times a day, Disp: 10 mL, Rfl: 1 .  UNIFINE PENTIPS 31G X 8 MM MISC, USE AS DIRECTED DAILY AFTER SUPPER, Disp: 100 each, Rfl: 3 .  Cholecalciferol (D3-1000 PO), Take 1 tablet by mouth See admin instructions. Take one tablet by mouth on Monday,Wednesday and Fridays, Disp: , Rfl:  .  clonazePAM (KLONOPIN) 0.5 MG tablet, TAKE 2 TABLETS EVERY MONDAY, WEDNESDAY, AND  FRIDAY WITH HEMODIALYSIS, Disp: 30 tablet, Rfl: 5 .  clonazePAM (KLONOPIN) 0.5 MG tablet, Take 1 tablet (0.5 mg total) by mouth at bedtime. Also take 80m in morning on Monday, Wednesday, Friday before dialysis, Disp: 60 tablet, Rfl: 0 .  clonazePAM (KLONOPIN) 0.5 MG tablet, Take 1 tablet (0.5 mg total) by mouth in morning on Mon, Wed, and Friday before dialysis and take 1 tablet at bedtime, Disp: 60 tablet, Rfl: 0 .  fluticasone furoate-vilanterol (BREO ELLIPTA) 200-25 MCG/INH AEPB, INHALE 1 PUFF BY MOUTH INTO THE LUNGS AT THE SAME TIME EVERY DAY, Disp: 60 each, Rfl: 2 .  Insulin Pen Needle 32G X 4 MM MISC, USE AS DIRECTED 4 TO 5 TIMES DAILY TO INJECT INSULIN, Disp: 400 each, Rfl: 6 .  lacosamide (VIMPAT) 200 MG TABS tablet, TAKE 1 TABLET TWICE A DAY EXCEPT ON DIALYSIS DAYS M-W-F GIVE 1 TAB IN AM, 1 TAB AFTER DIALYSIS, 1 TAB IN PM., Disp: 90 tablet, Rfl: 5 .  Lancets (FREESTYLE) lancets, , Disp: , Rfl:  .  Polyethyl Glycol-Propyl Glycol (SYSTANE OP), Place 1 drop into the left eye daily as needed (For drops)., Disp: , Rfl:    Allergies  Allergen Reactions  . Codeine Swelling  . Codeine Rash and Other (See Comments)    Unknown reaction (patient says it was more serious than just a rash, but he can't remember what happened)      Review of Systems  Constitutional: Negative.   Eyes: Negative for blurred vision.  Respiratory: Negative.   Cardiovascular: Negative.   Gastrointestinal: Negative.   Neurological: Negative.        She states he is still having auditory/olfactory hallucinations. Scheduled for inpatient evaluation next week.   Psychiatric/Behavioral: Negative.      Today's Vitals   12/27/20 1157  BP: 118/72  Pulse: 71  Temp: 98.1 F (36.7 C)  TempSrc: Oral  Weight: 184 lb (83.5 kg)  Height: _0  (1.778 m)  PainSc: 0-No pain   Body mass index is 26.4 kg/m.   Objective:  Physical Exam Vitals and nursing note reviewed.  Constitutional:      Appearance: Normal  appearance.  HENT:     Head: Normocephalic and atraumatic.     Nose:     Comments: Masked     Mouth/Throat:     Comments: Masked  Cardiovascular:     Rate and Rhythm: Normal rate and regular rhythm.     Heart sounds: Normal heart sounds.  Pulmonary:     Effort: Pulmonary effort is normal.     Breath sounds: Normal breath sounds.  Musculoskeletal:     Cervical back: Normal range of motion.  Skin:    General: Skin is warm.  Neurological:     Mental Status: He is alert.     Comments: r sided weakness  Psychiatric:        Mood and Affect: Mood normal.         Assessment And Plan:     1. Diabetes mellitus with end-stage renal disease ([place]) Comments: Chronic, also followed by Endocrinology, Dr Chalmers Cater.  He agrees to have blood drawn during dialysis.  Importance of dietary compliance was d/w patient.   2. Hyperparathyroidism, secondary renal ([place]) Comments: Chronic, also followed by Renal.   3. Benign hypertensive kidney disease with chronic kidney disease stage V or end stage renal disease ([place]) Comments: Chronic, well controlled. He is encouraged to follow low sodium diet.   4. Spastic hemiparesis (HCC) Comments: Right sided weakness due to previous stroke.   5. Paroxysmal atrial fibrillation (H[facility]) Comments: Chronic, currently in sinus rhythm. He is rate controlled. Not anticoagulated due to h/o SDH.   6. Olfactory hallucinations Comments: He is scheduled for inpatient video EEG next week. Treatment as per Neurology.   7. Immunization due Comments: I will send Shingrix to local pharmacy.   8. Dependence on renal dialysis [facility])     Patient was given opportunity to ask questions. Patient verbalized understanding of the plan and was able to repeat key elements of the plan. All questions were answered to their satisfaction.   I, Isaiah Greenland, MD, have reviewed all documentation for this visit. The documentation on 12/27/20 for the exam, diagnosis, procedures, and orders  are all accurate and complete.  IF YOU HAVE BEEN REFERRED TO A SPECIALIST, IT MAY TAKE 1-2 WEEKS TO SCHEDULE/PROCESS THE REFERRAL. IF YOU HAVE NOT HEARD FROM US/SPECIALIST IN TWO WEEKS, PLEASE GIVE [place] A CALL AT [phone].   THE PATIENT IS ENCOURAGED TO PRACTICE SOCIAL DISTANCING DUE TO THE COVID-19 PANDEMIC.

## 2020-12-28 ENCOUNTER — Encounter: Payer: Self-pay | Admitting: Internal Medicine

## 2020-12-28 ENCOUNTER — Other Ambulatory Visit (HOSPITAL_COMMUNITY)
Admission: RE | Admit: 2020-12-28 | Discharge: 2020-12-28 | Disposition: A | Discharge status: Home / Self Care | Payer: Medicare Other | Source: Ambulatory Visit | Attending: Neurology | Admitting: Neurology

## 2020-12-28 ENCOUNTER — Other Ambulatory Visit (HOSPITAL_COMMUNITY): Payer: Self-pay

## 2020-12-28 DIAGNOSIS — C9000 Multiple myeloma not having achieved remission: Secondary | ICD-10-CM | POA: Diagnosis not present

## 2020-12-28 DIAGNOSIS — D631 Anemia in chronic kidney disease: Secondary | ICD-10-CM | POA: Diagnosis not present

## 2020-12-28 DIAGNOSIS — Z20822 Contact with and (suspected) exposure to covid-19: Secondary | ICD-10-CM | POA: Diagnosis not present

## 2020-12-28 DIAGNOSIS — Z823 Family history of stroke: Secondary | ICD-10-CM | POA: Diagnosis not present

## 2020-12-28 DIAGNOSIS — Z992 Dependence on renal dialysis: Secondary | ICD-10-CM | POA: Diagnosis not present

## 2020-12-28 DIAGNOSIS — G8191 Hemiplegia, unspecified affecting right dominant side: Secondary | ICD-10-CM | POA: Diagnosis not present

## 2020-12-28 DIAGNOSIS — I959 Hypotension, unspecified: Secondary | ICD-10-CM | POA: Diagnosis not present

## 2020-12-28 DIAGNOSIS — F22 Delusional disorders: Secondary | ICD-10-CM | POA: Diagnosis present

## 2020-12-28 DIAGNOSIS — I120 Hypertensive chronic kidney disease with stage 5 chronic kidney disease or end stage renal disease: Secondary | ICD-10-CM | POA: Diagnosis not present

## 2020-12-28 DIAGNOSIS — Z833 Family history of diabetes mellitus: Secondary | ICD-10-CM | POA: Diagnosis not present

## 2020-12-28 DIAGNOSIS — R443 Hallucinations, unspecified: Secondary | ICD-10-CM | POA: Diagnosis not present

## 2020-12-28 DIAGNOSIS — N186 End stage renal disease: Secondary | ICD-10-CM | POA: Diagnosis not present

## 2020-12-28 DIAGNOSIS — R410 Disorientation, unspecified: Secondary | ICD-10-CM | POA: Diagnosis not present

## 2020-12-28 DIAGNOSIS — Z832 Family history of diseases of the blood and blood-forming organs and certain disorders involving the immune mechanism: Secondary | ICD-10-CM | POA: Diagnosis not present

## 2020-12-28 DIAGNOSIS — Z8249 Family history of ischemic heart disease and other diseases of the circulatory system: Secondary | ICD-10-CM | POA: Diagnosis not present

## 2020-12-28 DIAGNOSIS — G4733 Obstructive sleep apnea (adult) (pediatric): Secondary | ICD-10-CM | POA: Diagnosis present

## 2020-12-28 DIAGNOSIS — E8779 Other fluid overload: Secondary | ICD-10-CM | POA: Diagnosis not present

## 2020-12-28 DIAGNOSIS — Z01812 Encounter for preprocedural laboratory examination: Secondary | ICD-10-CM | POA: Insufficient documentation

## 2020-12-28 DIAGNOSIS — G40909 Epilepsy, unspecified, not intractable, without status epilepticus: Secondary | ICD-10-CM | POA: Diagnosis not present

## 2020-12-28 DIAGNOSIS — G40009 Localization-related (focal) (partial) idiopathic epilepsy and epileptic syndromes with seizures of localized onset, not intractable, without status epilepticus: Secondary | ICD-10-CM | POA: Diagnosis not present

## 2020-12-28 DIAGNOSIS — Z885 Allergy status to narcotic agent status: Secondary | ICD-10-CM | POA: Diagnosis not present

## 2020-12-28 DIAGNOSIS — Z794 Long term (current) use of insulin: Secondary | ICD-10-CM | POA: Diagnosis not present

## 2020-12-28 DIAGNOSIS — Z7951 Long term (current) use of inhaled steroids: Secondary | ICD-10-CM | POA: Diagnosis not present

## 2020-12-28 DIAGNOSIS — N2581 Secondary hyperparathyroidism of renal origin: Secondary | ICD-10-CM | POA: Diagnosis not present

## 2020-12-28 DIAGNOSIS — E1122 Type 2 diabetes mellitus with diabetic chronic kidney disease: Secondary | ICD-10-CM | POA: Diagnosis not present

## 2020-12-28 DIAGNOSIS — Z841 Family history of disorders of kidney and ureter: Secondary | ICD-10-CM | POA: Diagnosis not present

## 2020-12-28 DIAGNOSIS — R404 Transient alteration of awareness: Secondary | ICD-10-CM | POA: Diagnosis not present

## 2020-12-28 DIAGNOSIS — Z86711 Personal history of pulmonary embolism: Secondary | ICD-10-CM | POA: Diagnosis not present

## 2020-12-28 LAB — SARS CORONAVIRUS 2 (TAT 6-24 HRS): SARS Coronavirus 2: NEGATIVE

## 2020-12-31 ENCOUNTER — Inpatient Hospital Stay (HOSPITAL_COMMUNITY)
Admission: RE | Admit: 2020-12-31 | Discharge: 2021-01-05 | DRG: 884 | Disposition: A | Discharge status: Home / Self Care | Payer: Medicare Other | Attending: Neurology | Admitting: Neurology

## 2020-12-31 ENCOUNTER — Encounter (HOSPITAL_COMMUNITY): Payer: Self-pay | Admitting: Neurology

## 2020-12-31 ENCOUNTER — Other Ambulatory Visit: Payer: Self-pay

## 2020-12-31 ENCOUNTER — Inpatient Hospital Stay (HOSPITAL_COMMUNITY): Payer: Medicare Other

## 2020-12-31 DIAGNOSIS — Z992 Dependence on renal dialysis: Secondary | ICD-10-CM

## 2020-12-31 DIAGNOSIS — I120 Hypertensive chronic kidney disease with stage 5 chronic kidney disease or end stage renal disease: Secondary | ICD-10-CM | POA: Diagnosis not present

## 2020-12-31 DIAGNOSIS — Z823 Family history of stroke: Secondary | ICD-10-CM | POA: Diagnosis not present

## 2020-12-31 DIAGNOSIS — Z833 Family history of diabetes mellitus: Secondary | ICD-10-CM

## 2020-12-31 DIAGNOSIS — R443 Hallucinations, unspecified: Secondary | ICD-10-CM | POA: Diagnosis not present

## 2020-12-31 DIAGNOSIS — G8191 Hemiplegia, unspecified affecting right dominant side: Secondary | ICD-10-CM | POA: Diagnosis present

## 2020-12-31 DIAGNOSIS — F22 Delusional disorders: Secondary | ICD-10-CM | POA: Diagnosis present

## 2020-12-31 DIAGNOSIS — R410 Disorientation, unspecified: Secondary | ICD-10-CM | POA: Diagnosis not present

## 2020-12-31 DIAGNOSIS — Z885 Allergy status to narcotic agent status: Secondary | ICD-10-CM | POA: Diagnosis not present

## 2020-12-31 DIAGNOSIS — I959 Hypotension, unspecified: Secondary | ICD-10-CM | POA: Diagnosis present

## 2020-12-31 DIAGNOSIS — E113593 Type 2 diabetes mellitus with proliferative diabetic retinopathy without macular edema, bilateral: Secondary | ICD-10-CM | POA: Diagnosis present

## 2020-12-31 DIAGNOSIS — G4733 Obstructive sleep apnea (adult) (pediatric): Secondary | ICD-10-CM | POA: Diagnosis present

## 2020-12-31 DIAGNOSIS — G40909 Epilepsy, unspecified, not intractable, without status epilepticus: Secondary | ICD-10-CM | POA: Diagnosis not present

## 2020-12-31 DIAGNOSIS — E1122 Type 2 diabetes mellitus with diabetic chronic kidney disease: Secondary | ICD-10-CM | POA: Diagnosis not present

## 2020-12-31 DIAGNOSIS — J45909 Unspecified asthma, uncomplicated: Secondary | ICD-10-CM | POA: Diagnosis present

## 2020-12-31 DIAGNOSIS — Z8249 Family history of ischemic heart disease and other diseases of the circulatory system: Secondary | ICD-10-CM

## 2020-12-31 DIAGNOSIS — Z832 Family history of diseases of the blood and blood-forming organs and certain disorders involving the immune mechanism: Secondary | ICD-10-CM | POA: Diagnosis not present

## 2020-12-31 DIAGNOSIS — I4891 Unspecified atrial fibrillation: Secondary | ICD-10-CM | POA: Diagnosis present

## 2020-12-31 DIAGNOSIS — Z79899 Other long term (current) drug therapy: Secondary | ICD-10-CM

## 2020-12-31 DIAGNOSIS — D5740 Sickle-cell thalassemia without crisis: Secondary | ICD-10-CM | POA: Diagnosis present

## 2020-12-31 DIAGNOSIS — N186 End stage renal disease: Secondary | ICD-10-CM | POA: Diagnosis not present

## 2020-12-31 DIAGNOSIS — Z86711 Personal history of pulmonary embolism: Secondary | ICD-10-CM | POA: Diagnosis not present

## 2020-12-31 DIAGNOSIS — Z20822 Contact with and (suspected) exposure to covid-19: Secondary | ICD-10-CM | POA: Diagnosis not present

## 2020-12-31 DIAGNOSIS — C9000 Multiple myeloma not having achieved remission: Secondary | ICD-10-CM | POA: Diagnosis present

## 2020-12-31 DIAGNOSIS — Z7951 Long term (current) use of inhaled steroids: Secondary | ICD-10-CM

## 2020-12-31 DIAGNOSIS — N2581 Secondary hyperparathyroidism of renal origin: Secondary | ICD-10-CM | POA: Diagnosis not present

## 2020-12-31 DIAGNOSIS — G40009 Localization-related (focal) (partial) idiopathic epilepsy and epileptic syndromes with seizures of localized onset, not intractable, without status epilepticus: Secondary | ICD-10-CM | POA: Diagnosis not present

## 2020-12-31 DIAGNOSIS — Z794 Long term (current) use of insulin: Secondary | ICD-10-CM | POA: Diagnosis not present

## 2020-12-31 DIAGNOSIS — D631 Anemia in chronic kidney disease: Secondary | ICD-10-CM | POA: Diagnosis present

## 2020-12-31 DIAGNOSIS — K219 Gastro-esophageal reflux disease without esophagitis: Secondary | ICD-10-CM | POA: Diagnosis present

## 2020-12-31 DIAGNOSIS — R404 Transient alteration of awareness: Secondary | ICD-10-CM | POA: Diagnosis not present

## 2020-12-31 DIAGNOSIS — R112 Nausea with vomiting, unspecified: Secondary | ICD-10-CM | POA: Diagnosis not present

## 2020-12-31 DIAGNOSIS — F0390 Unspecified dementia without behavioral disturbance: Secondary | ICD-10-CM | POA: Diagnosis present

## 2020-12-31 DIAGNOSIS — Z841 Family history of disorders of kidney and ureter: Secondary | ICD-10-CM

## 2020-12-31 DIAGNOSIS — E785 Hyperlipidemia, unspecified: Secondary | ICD-10-CM | POA: Diagnosis present

## 2020-12-31 DIAGNOSIS — G479 Sleep disorder, unspecified: Secondary | ICD-10-CM | POA: Diagnosis present

## 2020-12-31 LAB — COMPREHENSIVE METABOLIC PANEL
ALT: 20 U/L (ref 0–44)
AST: 22 U/L (ref 15–41)
Albumin: 4.5 g/dL (ref 3.5–5.0)
Alkaline Phosphatase: 120 U/L (ref 38–126)
Anion gap: 12 (ref 5–15)
BUN: 24 mg/dL — ABNORMAL HIGH (ref 6–20)
CO2: 34 mmol/L — ABNORMAL HIGH (ref 22–32)
Calcium: 9.2 mg/dL (ref 8.9–10.3)
Chloride: 95 mmol/L — ABNORMAL LOW (ref 98–111)
Creatinine, Ser: 7.56 mg/dL — ABNORMAL HIGH (ref 0.61–1.24)
GFR, Estimated: 8 mL/min — ABNORMAL LOW (ref >60)
Glucose, Bld: 129 mg/dL — ABNORMAL HIGH (ref 70–99)
Potassium: 3.7 mmol/L (ref 3.5–5.1)
Sodium: 141 mmol/L (ref 135–145)
Total Bilirubin: 1 mg/dL (ref 0.3–1.2)
Total Protein: 7.5 g/dL (ref 6.5–8.1)

## 2020-12-31 LAB — GLUCOSE, CAPILLARY
Glucose-Capillary: 185 mg/dL — ABNORMAL HIGH (ref 70–99)
Glucose-Capillary: 166 mg/dL — ABNORMAL HIGH (ref 70–99)
Glucose-Capillary: 123 mg/dL — ABNORMAL HIGH (ref 70–99)

## 2020-12-31 LAB — CBC WITH DIFFERENTIAL/PLATELET
Abs Immature Granulocytes: 0.04 10*3/uL (ref 0.00–0.07)
Basophils Absolute: 0.2 10*3/uL — ABNORMAL HIGH (ref 0.0–0.1)
Basophils Relative: 1 %
Eosinophils Absolute: 1 10*3/uL — ABNORMAL HIGH (ref 0.0–0.5)
Eosinophils Relative: 9 %
HCT: 29.5 % — ABNORMAL LOW (ref 39.0–52.0)
Hemoglobin: 11 g/dL — ABNORMAL LOW (ref 13.0–17.0)
Immature Granulocytes: 0 %
Lymphocytes Relative: 10 %
Lymphs Abs: 1.2 10*3/uL (ref 0.7–4.0)
MCH: 34.3 pg — ABNORMAL HIGH (ref 26.0–34.0)
MCHC: 37.3 g/dL — ABNORMAL HIGH (ref 30.0–36.0)
MCV: 91.9 fL (ref 80.0–100.0)
Monocytes Absolute: 1.5 10*3/uL — ABNORMAL HIGH (ref 0.1–1.0)
Monocytes Relative: 13 %
Neutro Abs: 7.6 10*3/uL (ref 1.7–7.7)
Neutrophils Relative %: 67 %
Platelets: 278 10*3/uL (ref 150–400)
RBC: 3.21 MIL/uL — ABNORMAL LOW (ref 4.22–5.81)
RDW: 13.8 % (ref 11.5–15.5)
WBC: 11.5 10*3/uL — ABNORMAL HIGH (ref 4.0–10.5)
nRBC: 4 % — ABNORMAL HIGH (ref 0.0–0.2)

## 2020-12-31 LAB — PROTIME-INR
INR: 1.1 (ref 0.8–1.2)
Prothrombin Time: 14.6 seconds (ref 11.4–15.2)

## 2020-12-31 LAB — PHOSPHORUS: Phosphorus: 3.5 mg/dL (ref 2.5–4.6)

## 2020-12-31 LAB — HIV ANTIBODY (ROUTINE TESTING W REFLEX): HIV Screen 4th Generation wRfx: NONREACTIVE

## 2020-12-31 LAB — MAGNESIUM: Magnesium: 2.1 mg/dL (ref 1.7–2.4)

## 2020-12-31 MED ORDER — RENA-VITE PO TABS
1.0000 | ORAL_TABLET | Freq: Every day | ORAL | Status: DC
  Administered 2020-12-31 – 2021-01-04 (×5): 1 via ORAL
  Filled 2020-12-31 (×5): qty 1

## 2020-12-31 MED ORDER — POLYVINYL ALCOHOL 1.4 % OP SOLN
1.0000 [drp] | OPHTHALMIC | Status: DC | PRN
  Administered 2021-01-01 (×2): 1 [drp] via OPHTHALMIC
  Filled 2020-12-31: qty 15

## 2020-12-31 MED ORDER — INSULIN GLARGINE 100 UNIT/ML ~~LOC~~ SOLN
6.0000 [IU] | Freq: Every morning | SUBCUTANEOUS | Status: DC
  Administered 2021-01-01 – 2021-01-05 (×5): 6 [IU] via SUBCUTANEOUS
  Filled 2020-12-31 (×5): qty 0.06

## 2020-12-31 MED ORDER — MIDODRINE HCL 5 MG PO TABS
10.0000 mg | ORAL_TABLET | Freq: Two times a day (BID) | ORAL | Status: DC
  Administered 2020-12-31 – 2021-01-05 (×10): 10 mg via ORAL
  Filled 2020-12-31 (×12): qty 2

## 2020-12-31 MED ORDER — LORAZEPAM 2 MG/ML IJ SOLN
2.0000 mg | INTRAMUSCULAR | Status: DC | PRN
  Administered 2021-01-04: 1 mg via INTRAVENOUS
  Filled 2020-12-31 (×2): qty 1

## 2020-12-31 MED ORDER — ATORVASTATIN CALCIUM 10 MG PO TABS
20.0000 mg | ORAL_TABLET | Freq: Every day | ORAL | Status: DC
  Administered 2020-12-31 – 2021-01-04 (×5): 20 mg via ORAL
  Filled 2020-12-31 (×5): qty 2

## 2020-12-31 MED ORDER — ACETAMINOPHEN 650 MG RE SUPP: 650.0000 mg | RECTAL | Status: DC | PRN

## 2020-12-31 MED ORDER — LACOSAMIDE 200 MG PO TABS
200.0000 mg | ORAL_TABLET | Freq: Two times a day (BID) | ORAL | Status: DC
  Administered 2020-12-31 – 2021-01-01 (×4): 200 mg via ORAL
  Filled 2020-12-31 (×5): qty 1

## 2020-12-31 MED ORDER — LAMOTRIGINE 100 MG PO TABS
200.0000 mg | ORAL_TABLET | Freq: Two times a day (BID) | ORAL | Status: DC
  Administered 2020-12-31 – 2021-01-01 (×3): 200 mg via ORAL
  Filled 2020-12-31 (×3): qty 2

## 2020-12-31 MED ORDER — ACETAMINOPHEN 325 MG PO TABS: 650.0000 mg | ORAL_TABLET | ORAL | Status: DC | PRN

## 2020-12-31 MED ORDER — FERRIC CITRATE 1 GM 210 MG(FE) PO TABS
210.0000 mg | ORAL_TABLET | Freq: Three times a day (TID) | ORAL | Status: DC
  Administered 2020-12-31 – 2021-01-05 (×13): 210 mg via ORAL
  Filled 2020-12-31 (×16): qty 1

## 2020-12-31 MED ORDER — MIDODRINE HCL 5 MG PO TABS
10.0000 mg | ORAL_TABLET | ORAL | Status: DC
  Administered 2020-12-31 – 2021-01-04 (×2): 10 mg via ORAL
  Filled 2020-12-31 (×2): qty 2

## 2020-12-31 MED ORDER — PANTOPRAZOLE SODIUM 40 MG PO TBEC
40.0000 mg | DELAYED_RELEASE_TABLET | Freq: Every day | ORAL | Status: DC
  Administered 2021-01-01 – 2021-01-05 (×5): 40 mg via ORAL
  Filled 2020-12-31 (×5): qty 1

## 2020-12-31 MED ORDER — LABETALOL HCL 5 MG/ML IV SOLN: 5.0000 mg | INTRAVENOUS | Status: DC | PRN

## 2020-12-31 MED ORDER — INSULIN ASPART 100 UNIT/ML ~~LOC~~ SOLN
2.0000 [IU] | Freq: Three times a day (TID) | SUBCUTANEOUS | Status: DC
  Administered 2020-12-31 – 2021-01-03 (×6): 2 [IU] via SUBCUTANEOUS

## 2020-12-31 MED ORDER — LACOSAMIDE 200 MG PO TABS: 200.0000 mg | ORAL_TABLET | ORAL | Status: DC

## 2020-12-31 MED ORDER — ENOXAPARIN SODIUM 30 MG/0.3ML ~~LOC~~ SOLN
30.0000 mg | SUBCUTANEOUS | Status: DC
  Administered 2020-12-31 – 2021-01-04 (×5): 30 mg via SUBCUTANEOUS
  Filled 2020-12-31 (×5): qty 0.3

## 2020-12-31 MED ORDER — PREDNISOLONE ACETATE 1 % OP SUSP
1.0000 [drp] | Freq: Three times a day (TID) | OPHTHALMIC | Status: DC
  Administered 2020-12-31 – 2021-01-05 (×12): 1 [drp] via OPHTHALMIC
  Filled 2020-12-31: qty 5

## 2020-12-31 MED ORDER — CALCIUM ACETATE (PHOS BINDER) 667 MG PO CAPS
667.0000 mg | ORAL_CAPSULE | Freq: Three times a day (TID) | ORAL | Status: DC
  Administered 2020-12-31 – 2021-01-05 (×13): 667 mg via ORAL
  Filled 2020-12-31 (×13): qty 1

## 2020-12-31 MED ORDER — LAMOTRIGINE 100 MG PO TABS: 200.0000 mg | ORAL_TABLET | Freq: Two times a day (BID) | ORAL | Status: DC

## 2020-12-31 MED ORDER — CLONAZEPAM 0.5 MG PO TABS
1.0000 mg | ORAL_TABLET | ORAL | Status: DC
  Administered 2021-01-02 – 2021-01-04 (×2): 1 mg via ORAL
  Filled 2020-12-31 (×4): qty 2

## 2020-12-31 NOTE — Progress Notes (Addendum)
EMU maint complete - all leads attached. [facility] monitored

## 2020-12-31 NOTE — H&P (Addendum)
CC: Episodes of confusion  History is obtained from: Patient, wife, chart review  HPI: Isaiah [name] is a 55 y.o. male with past medical history of left MCA stroke in 2017, right subdural hematoma in 2020, seizures who is admitted to epilepsy monitoring unit for characterization of spells  Description of spells: Patient is a poor historian.  Per patient's wife and mother at bedside, patient was diagnosed with seizure and was on Vimpat and lamotrigine.  He then started having episodes of staring, confusion, unable to stand predominantly on Monday's.  He was seen by Dr. Delice Lesch at [facility] neurology and started on Klonopin 1 mg on Monday mornings after which the episodes have completely stopped.  However, since October 2021, patient has been having episodes where he wakes up few times a week at night around 1 or 2 AM, smells candles, sees shadows, thinks his son is walking around when he is sleeping in his own room, accuses wife of infidelity, gets agitated when she tries to interrupt him. These episodes do not happen when he naps during daytime. Per wife his long-term memory and immediate memory is okay but he has trouble with short-term memory.  For example, he might be talking to someone on phone and say how he has not talked to them in a while even though he talked to them just previous day.  He needs some help with basic ADLs like showering and feeding himself but that is mostly related to his right hemiparesis secondary to stroke.  Epilepsy risk factors: Per patient's mother, patient was born at term, denies any developmental delay, febrile seizures, family history of seizures.  Patient has had prior strokes and subdural hemorrhage.  Patient's family denies any history of dementia.  Prior 48-hour EEG in Jan 2021 showed occasional left temporal slowing, no epileptiform discharges. Typical episode of confusion and weakness captured did not show epileptiform correlate.   ROS: All other systems  reviewed and negative except as noted in the HPI.    Past Medical History:  Diagnosis Date  . A-fib ([place])   . Anemia   . Asthma   . Diabetic retinopathy ([place])    PDR OU  . DM type 2 (diabetes mellitus, type 2) ([place]) 06/09/2019  . ESRD (end stage renal disease) on dialysis ([place])   . Essential hypertension 06/09/2019  . GIB (gastrointestinal bleeding)    Recurrent episodes- 09/2014, 09/2015 and 05/2016  . Gout   . History of recent blood transfusion 10/27/14   2 Units PRBC's  . Hyperkalemia 07/2011  . Hypertensive retinopathy    OU  . Multiple myeloma ([place])   . OSA on CPAP   . Pulmonary embolism ([place]) 07/2011; 09/27/2014   a. Bilat PE 07/2011 - unclear cause, tx with 6 months Coumadin.;   . Seizure disorder ([place]) 06/09/2019  . Sepsis ([place])   . Sickle cell-thalassemia disease ([place])    a. Sickle cell trait.  . Sleep apnea   . Stroke ([place]) 09/2015   R-MCA, L-MCA, PCA and bilateral cerebellar complicated by DVT/PE  . Subdural hematoma ([place]) 05/2019    Family History  Problem Relation Age of Onset  . Hypertension Mother   . Hypertension Father   . Stroke Father   . Sickle cell anemia Brother   . Diabetes Mother   . Sickle cell anemia Brother   . Diabetes type I Brother   . Kidney disease Brother    Social History:  reports that he has never smoked. He has never used  smokeless tobacco. He reports previous alcohol use. He reports that he does not use drugs.   Medications Prior to Admission  Medication Sig Dispense Refill Last Dose  . acetaminophen (TYLENOL) 325 MG tablet Take 1-2 tablets (325-650 mg total) by mouth every 4 (four) hours as needed for mild pain.     [name] acyclovir (ZOVIRAX) 400 MG tablet TAKE 1 TABLET BY MOUTH ONCE DAILY (Patient not taking: Reported on 12/27/2020) 90 tablet 0   . albuterol (PROAIR HFA) 108 (90 Base) MCG/ACT inhaler Inhale two puffs every 4-6 hours if needed for cough or wheeze (Patient taking differently: Inhale 2 puffs into the lungs every 4 (four)  hours as needed for wheezing or shortness of breath. Inhale two puffs every 4-6 hours if needed for cough or wheeze) 6.7 g 2   . Alcohol Swabs (SM ALCOHOL PREP) 70 % PADS      . atorvastatin (LIPITOR) 20 MG tablet TAKE 1 TABLET BY MOUTH ONCE A DAY 90 tablet 2   . atorvastatin (LIPITOR) 20 MG tablet TAKE 1 TABLET BY MOUTH ONCE A DAY 30 tablet 0   . AURYXIA 1 GM 210 MG(Fe) tablet Take 210 mg by mouth daily at 2 am.     . calcitRIOL (ROCALTROL) 0.25 MCG capsule 1 capsule     . calcium acetate (PHOSLO) 667 MG capsule TAKE 3 CAPSULES BY MOUTH THREE TIMES DAILY WITH MEALS AND 2 CAPSULES TWO TIMES DAILY WITH SNACKS (Patient taking differently: 1 cap 3 times per day) 390 capsule 10   . camphor-menthol (SARNA) lotion Apply 1 application topically every 8 (eight) hours as needed for itching. 222 mL 0   . Cetirizine HCl 10 MG CAPS Take 1 capsule by mouth daily.      . Cholecalciferol (D3-1000 PO) Take by mouth.     . clonazePAM (KLONOPIN) 0.5 MG tablet TAKE 2 TABLETS EVERY MONDAY, WEDNESDAY, AND FRIDAY WITH HEMODIALYSIS 30 tablet 5   . dexamethasone (DECADRON) 4 MG tablet 1 tablet     . dextrose 50 % solution Dextrose 50%     . ferric citrate (AURYXIA) 1 GM 210 MG(Fe) tablet TAKE 1 TABLET BY MOUTH 3 TIMES DAILY WITH MEALS AS DIRECTED. 270 tablet 3   . fluticasone furoate-vilanterol (BREO ELLIPTA) 200-25 MCG/INH AEPB INHALE 1 PUFF BY MOUTH INTO THE LUNGS AT THE SAME TIME EVERY DAY 60 each 2   . FREESTYLE LITE test strip      . Glucagon, rDNA, (GLUCAGON EMERGENCY) 1 MG KIT See admin instructions.     . hydrOXYzine (ATARAX/VISTARIL) 25 MG tablet TAKE 1 TABLET BY MOUTH EVERY 8 HOURS AS NEEDED FOR ITCHING 30 tablet 0   . Insulin Glargine (BASAGLAR KWIKPEN) 100 UNIT/ML INJECT 6 UNITS UNDER THE SKIN ONCE A DAY (DISCARD 28 DAYS AFTER FIRST USE) 3 mL 6   . Insulin Lispro (HUMALOG KWIKPEN [place]) Inject 2-4 Units into the skin 3 (three) times daily before meals. Sliding scale: 150-250 =2 units, 250 -350=3 units, 350 += 4  units     . Insulin Pen Needle 32G X 4 MM MISC USE AS DIRECTED 4 TO 5 TIMES DAILY TO INJECT INSULIN 400 each 6   . lacosamide (VIMPAT) 200 MG TABS tablet TAKE 1 TABLET TWICE A DAY EXCEPT ON DIALYSIS DAYS M-W-F GIVE 1 TAB IN AM, 1 TAB AFTER DIALYSIS, 1 TAB IN PM. 90 tablet 5   . lamoTRIgine (LAMICTAL) 200 MG tablet Take 1 tablet (200 mg total) by mouth 2 (two) times daily. 60 tablet  11   . lamoTRIgine (LAMICTAL) 200 MG tablet TAKE 1 TABLET BY MOUTH 2 TIMES DAILY 60 tablet 11   . Lancets (FREESTYLE) lancets      . Methoxy PEG-Epoetin Beta (MIRCERA IJ) Mircera     . Methoxy PEG-Epoetin Beta (MIRCERA IJ) Mircera     . metoprolol tartrate (LOPRESSOR) 25 MG tablet Take 25 mg by mouth daily.     . midodrine (PROAMATINE) 10 MG tablet TAKE 1 TABLET BY MOUTH 2 TIMES DAILY WITH MEALS 60 tablet 0   . midodrine (PROAMATINE) 10 MG tablet TAKE 1 TABLET BY MOUTH TWICE DAILY WITH MEALS. (Patient not taking: Reported on 12/27/2020) 180 tablet 2   . multivitamin (RENA-VIT) TABS tablet Take 1 tablet by mouth at bedtime.     . pantoprazole (PROTONIX) 40 MG tablet TAKE 1 TABLET BY MOUTH ONCE DAILY 90 tablet 1   . prednisoLONE acetate (PRED FORTE) 1 % ophthalmic suspension Instill 1 drop Right Eye 3 times a day 10 mL 1   . prochlorperazine (COMPAZINE) 10 MG tablet TAKE 1 TABLET BY MOUTH EVERY 6 HOURS AS NEEDED FOR NAUSEA OR VOMITING. 30 tablet 0   . UNIFINE PENTIPS 31G X 8 MM MISC USE AS DIRECTED DAILY AFTER SUPPER 100 each 3      Exam: Current vital signs: BP 136/78 (BP Location: Left Arm)   Pulse 78   Temp 97.7 F (36.5 C) (Oral)   Resp 18   SpO2 100%  Vital signs in last 24 hours: Temp:  [97.7 F (36.5 C)] 97.7 F (36.5 C) (04/25 1000) Pulse Rate:  [78] 78 (04/25 1000) Resp:  [18] 18 (04/25 1000) BP: (136)/(78) 136/78 (04/25 1000) SpO2:  [100 %] 100 % (04/25 1000)   Physical Exam  Constitutional: Appears well-developed and well-nourished.  Psych: Affect appropriate to situation Eyes: No scleral  injection HENT: No OP obstrucion Head: Normocephalic.  Cardiovascular: Normal rate and regular rhythm.  Respiratory: Effort normal, non-labored breathing GI: Soft.  No distension. There is no tenderness.  Skin: Warm Neuro: AOx3, cranial nerves II through XII grossly intact, right upper and lower extremity 4/5, 5/5 in left upper and lower extremities  I have reviewed labs in epic and the results pertinent to this consultation are: CBC: No results for input(s): WBC, NEUTROABS, HGB, HCT, MCV, PLT in the last 168 hours.  Basic Metabolic Panel:  Lab Results  Component Value Date   NA 142 11/13/2020   K 4.3 11/13/2020   CO2 30 11/13/2020   GLUCOSE 212 (H) 11/13/2020   BUN 37 (H) 11/13/2020   CREATININE 8.72 (HH) 11/13/2020   CALCIUM 9.1 11/13/2020   GFRNONAA 7 (L) 11/13/2020   GFRAA 8 (L) 05/22/2020   Lipid Panel:  Lab Results  Component Value Date   LDLCALC 89 03/17/2019   HgbA1c:  Lab Results  Component Value Date   HGBA1C 5.0 03/22/2020   Urine Drug Screen: No results found for: LABOPIA, COCAINSCRNUR, LABBENZ, AMPHETMU, THCU, LABBARB  Alcohol Level     Component Value Date/Time   ETH <5 01/06/2016 1144     I have reviewed the images obtained:   CT Head without contrast 10/04/2019: No acute infarct identified.  ASPECTS of 10. Chronic subdural hematoma over the right cerebral convexity, mildly increased in density compared to the prior CT though unchanged in size and without definite acute hemorrhage. Unchanged 3 mm of leftward midline shift. Chronic bilateral cerebral and left cerebellar infarcts.  MR Brain wo contrast 06/09/2019: Right-sided subdural hematoma appears predominantly  subacute although there may be an area of more recent hemorrhage posteriorly based on high density on CT. Fluid measures up to 14 mm in thickness in the parietal lobe and 9 mm in thickness in the frontal lobe. 5 mm midline shift to the left. Negative for acute infarct. Generalized atrophy with  chronic ischemia.  ASSESSMENT/PLAN: 55 year old male with episodes of confusion, hallucination, delusions predominantly at night.  Transient alteration of awareness Delusions Hallucinations - On video EEG for for characterization of spells - Continue Lamotrigine,Vimpat and Klonopin -Continue seizure precautions - As needed IV Ativan 2 mg clinical seizure-like activity  Diabetes - Continue home medications and SSI  Chronic kidney disease, on dialysis -We will contact dialysis department for dialysis on Wednesday and Friday -Continue calcium and ferritin, Rena-Vite  Hyperlipidemia -Continue atorvastatin  GERD -Continue Protonix  Hypotension -Continue midodrine    [facility]

## 2020-12-31 NOTE — Plan of Care (Signed)
  Problem: Education: Goal: Knowledge of General Education information will improve Description: Including pain rating scale, medication(s)/side effects and non-pharmacologic comfort measures Outcome: Progressing   Problem: Health Behavior/Discharge Planning: Goal: Ability to manage health-related needs will improve Outcome: Progressing   Problem: Clinical Measurements: Goal: Ability to maintain clinical measurements within normal limits will improve Outcome: Progressing Goal: Will remain free from infection Outcome: Progressing Goal: Diagnostic test results will improve Outcome: Progressing Goal: Respiratory complications will improve Outcome: Progressing Goal: Cardiovascular complication will be avoided Outcome: Progressing   Problem: Activity: Goal: Risk for activity intolerance will decrease Outcome: Progressing   Problem: Nutrition: Goal: Adequate nutrition will be maintained Outcome: Progressing   Problem: Coping: Goal: Level of anxiety will decrease Outcome: Progressing   Problem: Elimination: Goal: Will not experience complications related to bowel motility Outcome: Progressing Goal: Will not experience complications related to urinary retention Outcome: Progressing   Problem: Pain Managment: Goal: General experience of comfort will improve Outcome: Progressing   Problem: Safety: Goal: Ability to remain free from injury will improve Outcome: Progressing   Problem: Skin Integrity: Goal: Risk for impaired skin integrity will decrease Outcome: Progressing   Problem: Education: Goal: Expressions of having a comfortable level of knowledge regarding the disease process will increase Outcome: Progressing   Problem: Coping: Goal: Ability to adjust to condition or change in health will improve Outcome: Progressing Goal: Ability to identify appropriate support needs will improve Outcome: Progressing   Problem: Health Behavior/Discharge Planning: Goal:  Compliance with prescribed medication regimen will improve Outcome: Progressing   Problem: Medication: Goal: Risk for medication side effects will decrease Outcome: Progressing   Problem: Clinical Measurements: Goal: Complications related to the disease process, condition or treatment will be avoided or minimized Outcome: Progressing Goal: Diagnostic test results will improve Outcome: Progressing   Problem: Safety: Goal: Verbalization of understanding the information provided will improve Outcome: Progressing   Problem: Self-Concept: Goal: Level of anxiety will decrease Outcome: Progressing Goal: Ability to verbalize feelings about condition will improve Outcome: Progressing

## 2020-12-31 NOTE — Progress Notes (Signed)
Pt received from home as a direct admit. Wife and mother with pt in room.

## 2020-12-31 NOTE — Progress Notes (Incomplete)
Inpatient Diabetes Program Recommendations  AACE/ADA: New Consensus Statement on Inpatient Glycemic Control (2015)  Target Ranges:  Prepandial:   less than 140 mg/dL      Peak postprandial:   less than 180 mg/dL (1-2 hours)      Critically ill patients:  140 - 180 mg/dL   Lab Results  Component Value Date   GLUCAP 130 (H) 03/20/2020   HGBA1C 5.0 03/22/2020    Review of Glycemic Control  Diabetes history: *** Outpatient Diabetes medications: *** Current orders for Inpatient glycemic control: ***  Inpatient Diabetes Program Recommendations:

## 2020-12-31 NOTE — Progress Notes (Signed)
EMU LTM EEG running. [facility] notified no initial skin breakdown.

## 2020-12-31 NOTE — Progress Notes (Signed)
Inpatient Diabetes Program Recommendations  AACE/ADA: New Consensus Statement on Inpatient Glycemic Control (2015)  Target Ranges:  Prepandial:   less than 140 mg/dL      Peak postprandial:   less than 180 mg/dL (1-2 hours)      Critically ill patients:  140 - 180 mg/dL   Lab Results  Component Value Date   GLUCAP 130 (H) 03/20/2020   HGBA1C 5.0 03/22/2020    Review of Glycemic Control Results for [name], [name] (MRN [id number]) as of 12/31/2020 15:52  Ref. Range 12/31/2020 13:06  Glucose Latest Ref Range: 70 - 99 mg/dL 129 (H)   Diabetes history: DM 2 Outpatient Diabetes medications:  Lantus 6 units q AM, Novolog 2-4 units tid with meals Current orders for Inpatient glycemic control:  Lantus 6 units q AM, Novolog 2-4 units tid with meals  Inpatient Diabetes Program Recommendations:    Agree with orders.  Patient also has Dexcom sensor for CGM.  Will need to validate CGM readings with fingersticks while in the hospital.  Sent message to RN.    Thanks, Adah Perl, RN, BC-ADM Inpatient Diabetes Coordinator Pager [phone] (8a-5p)

## 2021-01-01 DIAGNOSIS — R410 Disorientation, unspecified: Secondary | ICD-10-CM

## 2021-01-01 LAB — GLUCOSE, CAPILLARY
Glucose-Capillary: 194 mg/dL — ABNORMAL HIGH (ref 70–99)
Glucose-Capillary: 174 mg/dL — ABNORMAL HIGH (ref 70–99)
Glucose-Capillary: 153 mg/dL — ABNORMAL HIGH (ref 70–99)
Glucose-Capillary: 130 mg/dL — ABNORMAL HIGH (ref 70–99)

## 2021-01-01 LAB — PATHOLOGIST SMEAR REVIEW

## 2021-01-01 MED ORDER — CHLORHEXIDINE GLUCONATE CLOTH 2 % EX PADS
6.0000 | MEDICATED_PAD | Freq: Every day | CUTANEOUS | Status: DC
  Administered 2021-01-02 – 2021-01-05 (×2): 6 via TOPICAL

## 2021-01-01 MED ORDER — PROSOURCE PLUS PO LIQD
30.0000 mL | Freq: Two times a day (BID) | ORAL | Status: DC
  Administered 2021-01-02 – 2021-01-03 (×2): 30 mL via ORAL
  Filled 2021-01-01 (×3): qty 30

## 2021-01-01 MED ORDER — CINACALCET HCL 30 MG PO TABS
60.0000 mg | ORAL_TABLET | ORAL | Status: DC
  Administered 2021-01-04: 60 mg via ORAL
  Filled 2021-01-01 (×2): qty 2

## 2021-01-01 MED ORDER — CLONAZEPAM 0.5 MG PO TABS
ORAL_TABLET | ORAL | 5 refills | Status: DC
  Filled 2021-01-01: qty 30, 35d supply, fill #0
  Filled 2021-02-05 – 2021-03-06 (×2): qty 30, 35d supply, fill #1

## 2021-01-01 MED ORDER — NEPRO/CARBSTEADY PO LIQD: 237.0000 mL | Freq: Two times a day (BID) | ORAL | Status: DC

## 2021-01-01 MED ORDER — CALCITRIOL 0.25 MCG PO CAPS
1.7500 ug | ORAL_CAPSULE | ORAL | Status: DC
  Filled 2021-01-01: qty 7

## 2021-01-01 NOTE — Progress Notes (Signed)
Pt alert and verbally responsive. Pt was stable during shift with no seizure activity witnessed or reported, pt rested comfortably in bed with call light within reach. Will continue to closely monitor pt and and report off to oncoming RN.Isaiah [name]

## 2021-01-01 NOTE — Progress Notes (Signed)
Initial Nutrition Assessment  DOCUMENTATION CODES:   Not applicable  INTERVENTION:  -Nepro Shake po BID, each supplement provides 425 kcal and 19 grams protein -93m Prosource Plus po BID, each supplement provides 100 kcals and 15 grams of protein -Continue renal mvi   NUTRITION DIAGNOSIS:   Increased nutrient needs related to chronic illness (ESRD on HD) as evidenced by estimated needs.   GOAL:   Patient will meet greater than or equal to 90% of their needs   MONITOR:   PO intake,Supplement acceptance,Weight trends,Labs,I & O's  REASON FOR ASSESSMENT:   Malnutrition Screening Tool    ASSESSMENT:   Pt admitted with transient alteration of awareness, delusions, and hallucinations. PMH includes ESRD on HD, Afib, HTN, type 2 DM, seizure disorder, h/o stroke  Pt reports no issues with appetite and denies any changes to weight PTA. Meal completions charted as 90-100%. Wt history reviewed with no significant weight changes noted. Pt to have dialysis tomorrow.   Unsure of EDW at this time  No UOP documented.   Medications: phoslo, ferric citrate, 2-4 units novolog TID w/ meals, 6 units lantus daily, renavit, protonix, pred forte Labs reviewed. CBGs [id number] Diet Order:   Diet Order            Diet renal/carb modified with fluid restriction Diet-HS Snack? Nothing; Fluid restriction: 1200 mL Fluid; Room service appropriate? Yes; Fluid consistency: Thin  Diet effective now                 EDUCATION NEEDS:   Not appropriate for education at this time  Skin:  Skin Assessment: Reviewed RN Assessment  Last BM:  4/26  Height:   Ht Readings from Last 1 Encounters:  12/31/20 5' 11"  (1.803 m)    Weight:   Wt Readings from Last 1 Encounters:  12/31/20 83.5 kg    BMI:  Body mass index is 25.67 kg/m.  Estimated Nutritional Needs:   Kcal:  2100-2300  Protein:  105-115 grams  Fluid:  1L+UOP    ALarkin Ina MS, RD, LDN RD pager number and  weekend/on-call pager number located in [facility]

## 2021-01-01 NOTE — Consult Note (Signed)
ESRD Consult Note  Requesting provider: Lora Havens, [name]   Outpatient dialysis unit: Isaiah [name] Outpatient dialysis schedule: MWF  Assessment/Recommendations:   ESRD: -Outpatient orders: 4 hours, F180, 2K, 2Ca, BFR/DFR: 400/500 -no heparin -HD tomorrow, plan to keep him on his MWF while he's admitted  Volume/ hypotension: EDW 84kg. Attempt to achieve EDW as tolerated, resume midodrine  Anemia of Chronic Kidney Disease: Hemoglobin 11. Currently receiving mircera 72mg q4weeks, last dose 12/26/20, can wait for his next scheduled dose.   Secondary Hyperparathyroidism/Hyperphosphatemia: resuming calcitriol 1.7110m qtreatment, sensipar 6072mtreatment   Vascular access: LUE AVF +b/t  Hallucinations, delusions, transient alteration of awareness -EEG neg for seizures, per neuro  Additional recommendations: - Dose all meds for creatinine clearance < 10 ml/min  - Unless absolutely necessary, no MRIs with gadolinium.  - Implement save arm precautions.  Prefer needle sticks in the dorsum of the hands or wrists.  No blood pressure measurements in arm. - If blood transfusion is requested during hemodialysis sessions, please alert us [place] to the session.  - If a hemodialysis catheter line culture is requested, please alert us [place] only hemodialysis nurses are able to collect those specimens.   Recommendations were discussed with the primary team.  Isaiah [name]   History of Present Illness: Isaiah [name] a/an 55 25o. male with a past medical history of ESRD on HD MWF, DM2 (with retinopathy), HTN (with retinopathy), multiple myeloma, OSA, asthma, chronic anemia, h/[name] GIBs, seizure d/[name], h/[name] CVA, h/[name] SDH who presents with concerns for seizures. Was having episodes of staring, confusion, hallucinations. Symptoms/episodes primarily occurring at night. EEG performed here and was normal.  He currently reports feeling 'great.' Denies any issues with HD recently or with  his AVF. Denies fevers, chest pain, swelling, SOB, n/v.   Medications:  Current Facility-Administered Medications  Medication Dose Route Frequency Provider Last Rate Last Admin  . [START ON 01/02/2021] (feeding supplement) PROSource Plus liquid 30 mL  30 mL Oral BID BM Isaiah [name]      . acetaminophen (TYLENOL) tablet 650 mg  650 mg Oral Q4H PRN Isaiah [name]       Or  . acetaminophen (TYLENOL) suppository 650 mg  650 mg Rectal Q4H PRN Isaiah [name]      . atorvastatin (LIPITOR) tablet 20 mg  20 mg Oral QHS Isaiah [name]   20 mg at 12/31/20 2115  . [START ON 01/02/2021] calcitRIOL (ROCALTROL) capsule 1.75 mcg  1.75 mcg Oral Q M,W,F-HD Isaiah [name]      . calcium acetate (PHOSLO) capsule 667 mg  667 mg Oral TID WC Isaiah [name]   667 mg at 01/01/21 1647  . [START ON 01/02/2021] Chlorhexidine Gluconate Cloth 2 % PADS 6 each  6 each Topical Q0600 Isaiah [name]      . [STDerrill Memo 01/02/2021] cinacalcet (SENSIPAR) tablet 60 mg  60 mg Oral Q M,W,F-HD Isaiah [name]      . clonazePAM (KLONOPIN) tablet 1 mg  1 mg Oral Q M,W,F Isaiah [name]      . enoxaparin (LOVENOX) injection 30 mg  30 mg Subcutaneous Q24H Isaiah [name]   30 mg at 01/01/21 1417  . [START ON 01/02/2021] feeding supplement (NEPRO CARB STEADY) liquid 237 mL  237 mL Oral BID BM Isaiah [name]      . ferric citrate (AURYXIA) tablet 210 mg  210 mg Oral TID  WC Isaiah [name]   210 mg at 01/01/21 1647  . insulin aspart (novoLOG) injection 2-4 Units  2-4 Units Subcutaneous TID WC Isaiah [name]   2 Units at 01/01/21 1740  . insulin glargine (LANTUS) injection 6 Units  6 Units Subcutaneous q morning Isaiah [name]   6 Units at 01/01/21 1004  . labetalol (NORMODYNE) injection 5 mg  5 mg Intravenous PRN Isaiah [name]      . lacosamide (VIMPAT) tablet 200 mg  200 mg Oral BID Isaiah [name]   200 mg at 01/01/21 1002   And  . [START ON 01/02/2021]  lacosamide (VIMPAT) tablet 200 mg  200 mg Oral Q M,W,F Isaiah [name]      . lamoTRIgine (LAMICTAL) tablet 200 mg  200 mg Oral BID Isaiah [name]   200 mg at 01/01/21 1002  . LORazepam (ATIVAN) injection 2 mg  2 mg Intravenous PRN Isaiah [name]      . midodrine (PROAMATINE) tablet 10 mg  10 mg Oral BID Isaiah [name]   10 mg at 01/01/21 1003   And  . midodrine (PROAMATINE) tablet 10 mg  10 mg Oral Q M,W,F Isaiah [name]   10 mg at 12/31/20 1501  . multivitamin (RENA-VIT) tablet 1 tablet  1 tablet Oral QHS Isaiah [name]   1 tablet at 12/31/20 2116  . pantoprazole (PROTONIX) EC tablet 40 mg  40 mg Oral Daily Isaiah [name]   40 mg at 01/01/21 1001  . polyvinyl alcohol (LIQUIFILM TEARS) 1.4 % ophthalmic solution 1 drop  1 drop Left Eye PRN Isaiah [name]   1 drop at 01/01/21 1538  . prednisoLONE acetate (PRED FORTE) 1 % ophthalmic suspension 1 drop  1 drop Right Eye TID Isaiah [name]   1 drop at 01/01/21 1536     ALLERGIES Codeine and Codeine  MEDICAL HISTORY Past Medical History:  Diagnosis Date  . A-fib ([place])   . Anemia   . Diabetic retinopathy ([place])    PDR OU  . DM type 2 (diabetes mellitus, type 2) ([place]) 06/09/2019  . ESRD (end stage renal disease) on dialysis ([place])   . Essential hypertension 06/09/2019  . GIB (gastrointestinal bleeding)    Recurrent episodes- 09/2014, 09/2015 and 05/2016  . Gout   . History of recent blood transfusion 10/27/14   2 Units PRBC's  . Hyperkalemia 07/2011  . Hypertensive retinopathy    OU  . Multiple myeloma ([place])   . Pulmonary embolism ([place]) 07/2011; 09/27/2014   a. Bilat PE 07/2011 - unclear cause, tx with 6 months Coumadin.;   . Seizure disorder ([place]) 06/09/2019  . Sepsis ([employer])   . Sickle cell-thalassemia disease ([place])    a. Sickle cell trait.  . Stroke ([place]) 09/2015   R-MCA, L-MCA, PCA and bilateral cerebellar complicated by DVT/PE  . Subdural hematoma ([place]) 05/2019     SOCIAL  HISTORY Social History   Socioeconomic History  . Marital status: Married    Spouse name: Isaiah [name]  . Number of children: 1  . Years of education: college  . Highest education level: Not on file  Occupational History  . Occupation: [employer]  . Occupation: disability  Tobacco Use  . Smoking status: Never Smoker  . Smokeless tobacco: Never Used  Vaping Use  . Vaping Use: Never used  Substance and Sexual Activity  . Alcohol  use: Not Currently  . Drug use: Never  . Sexual activity: Yes    Partners: Female  Other Topics Concern  . Not on file  Social History Narrative   ** Merged History Encounter **       Pt lives in 2 story home with his wife and 1 son   Has masters degree in psychology   Currently disabled.   Right handed       Social Determinants of Health   Financial Resource Strain: Low Risk   . Difficulty of Paying Living Expenses: Not hard at all  Food Insecurity: No Food Insecurity  . Worried About Charity fundraiser in the Last Year: Never true  . Ran Out of Food in the Last Year: Never true  Transportation Needs: No Transportation Needs  . Lack of Transportation (Medical): No  . Lack of Transportation (Non-Medical): No  Physical Activity: Insufficiently Active  . Days of Exercise per Week: 4 days  . Minutes of Exercise per Session: 30 min  Stress: No Stress Concern Present  . Feeling of Stress : Not at all  Social Connections: Not on file  Intimate Partner Violence: Not At Risk  . Fear of Current or Ex-Partner: No  . Emotionally Abused: No  . Physically Abused: No  . Sexually Abused: No     FAMILY HISTORY Family History  Problem Relation Age of Onset  . Hypertension Mother   . Hypertension Father   . Stroke Father   . Sickle cell anemia Brother   . Diabetes Mother   . Sickle cell anemia Brother   . Diabetes type I Brother   . Kidney disease Brother      Review of Systems: 12 systems were reviewed and negative except per  HPI  Physical Exam: Vitals:   01/01/21 1139 01/01/21 1515  BP: (!) 158/58 (!) 117/55  Pulse: 65 69  Resp: 18 18  Temp: 98.6 F (37 C) 98.2 F (36.8 C)  SpO2: 100% 100%   No intake/output data recorded.  Intake/Output Summary (Last 24 hours) at 01/01/2021 1829 Last data filed at 01/01/2021 0600 Gross per 24 hour  Intake 560 ml  Output --  Net 560 ml   General: well-appearing, no acute distress, sitting up in bed HEENT: anicteric sclera, MMM CV: normal rate, no murmurs Lungs: cta bl, bilateral chest rise, normal wob Abd: soft, non-tender, non-distended Msk: no edema Neuro: awake, alert, normal speech, no gross focal deficits, moves all ext spontaneously Dialysis access: RUE AVF w/ +b/t  Test Results Reviewed Lab Results  Component Value Date   NA 141 12/31/2020   K 3.7 12/31/2020   CL 95 (L) 12/31/2020   CO2 34 (H) 12/31/2020   BUN 24 (H) 12/31/2020   CREATININE 7.56 (H) 12/31/2020   GLU 162 08/15/2020   CALCIUM 9.2 12/31/2020   ALBUMIN 4.5 12/31/2020   PHOS 3.5 12/31/2020    I have reviewed relevant outside healthcare records

## 2021-01-01 NOTE — Progress Notes (Signed)
EEG maintenance complete. No skin breakdown at [place] FP2. Tested event button. continue to monitor

## 2021-01-01 NOTE — Progress Notes (Addendum)
Subjective: No acute events overnight.  No new concerns.  ROS: negative except above  Examination  Vital signs in last 24 hours: Temp:  [98 F (36.7 C)-98.7 F (37.1 C)] 98 F (36.7 C) (04/26 0814) Pulse Rate:  [60-66] 62 (04/26 0814) Resp:  [16-20] 20 (04/26 0814) BP: (127-144)/(61-67) 133/67 (04/26 0814) SpO2:  [99 %-100 %] 100 % (04/26 0814)  General: lying in bed, not in apparent distress CVS: pulse-normal rate and rhythm RS: breathing comfortably Extremities: normal  Neuro: AOx3, cranial nerves II through XII grossly intact, right upper and lower extremity 4/5, 5/5 in left upper and lower extremities  Basic Metabolic Panel: Recent Labs  Lab 12/31/20 1306  NA 141  K 3.7  CL 95*  CO2 34*  GLUCOSE 129*  BUN 24*  CREATININE 7.56*  CALCIUM 9.2  MG 2.1  PHOS 3.5    CBC: Recent Labs  Lab 12/31/20 1306  WBC 11.5*  NEUTROABS 7.6  HGB 11.0*  HCT 29.5*  MCV 91.9  PLT 278     Coagulation Studies: Recent Labs    12/31/20 1306  LABPROT 14.6  INR 1.1    Imaging No new brain imaging overnight  ASSESSMENT AND PLAN: 55 year old male with episodes of confusion, hallucination, delusions predominantly at night.  Transient alteration of awareness Delusions Hallucinations - On video EEG for characterization of spells - Continue Lamotrigine,Vimpat and Klonopin -Continue seizure precautions - As needed IV Ativan 2 mg clinical seizure-like activity  Diabetes - Continue home medications and SSI  Chronic kidney disease, on dialysis -Continue dialysis on Wednesday and Friday -Continue calcium and ferritin, Rena-Vite  Hyperlipidemia -Continue atorvastatin  GERD -Continue Protonix  Hypotension -Continue midodrine   I have spent a total of 25  minutes with the patient reviewing hospital notes,  test results, labs and examining the patient as well as establishing an assessment and plan that was discussed personally with the patient and his wife.  >  50% of time was spent in direct patient care.  Zeb Comfort Epilepsy [facility] For questions after 5pm please refer to AMION to reach the Neurologist on call

## 2021-01-01 NOTE — Procedures (Addendum)
Patient Name: Isaiah [name] [facility]  MRN: [id number]  Epilepsy Attending: Lora Havens  Referring Physician/Provider: Dr Zeb Comfort Duration: 12/31/2020 1115 to 01/01/2021 1115  Patient history: 55 year old male with episodes of confusion, hallucination, delusions predominantly at night. EEG to evaluate for seizure  Level of alertness: Awake, asleep  AEDs during EEG study: LTG, LCM, Clonazapem  Technical aspects: This EEG study was done with scalp electrodes positioned according to the 10-20 International system of electrode placement. Electrical activity was acquired at a sampling rate of 500Hz  and reviewed with a high frequency filter of 70Hz  and a low frequency filter of 1Hz . EEG data were recorded continuously and digitally stored.   Description: The posterior dominant rhythm consists of 8 Hz activity of moderate voltage (25-35 uV) seen predominantly in posterior head regions, symmetric and reactive to eye opening and eye closing. Sleep was characterized by vertex waves, sleep spindles (12 to 14 Hz), maximal frontocentral region. Hyperventilation and photic stimulation were not performed.     IMPRESSION: This study is within normal limits. No seizures or epileptiform discharges were seen throughout the recording.  Isaiah [name] [name]

## 2021-01-01 NOTE — Progress Notes (Signed)
Informed of consult and HD needs. Full consult to follow.  HD ordered for tomorrow, will tentatively plan to keep him on his MWF schedule, last HD was Monday 4/25.   Outpatient orders as follows: Outpatient unit: [facility] 4 hours, F180, 2K, 2Ca, BFR/DFR: 400/500. EDW 84kg. Access: AVF Treatment medications: Mircera 54m q4weeks, last dose 12/26/2020 Calcitriol 1.711m qtreatment (resumed) Sensipar 6054mtreatment (resumed) Not on heparin  VikGean QuintD [place]

## 2021-01-01 NOTE — Plan of Care (Signed)
  Problem: Education: Goal: Knowledge of General Education information will improve Description: Including pain rating scale, medication(s)/side effects and non-pharmacologic comfort measures Outcome: Progressing   Problem: Health Behavior/Discharge Planning: Goal: Ability to manage health-related needs will improve Outcome: Progressing   Problem: Clinical Measurements: Goal: Ability to maintain clinical measurements within normal limits will improve Outcome: Progressing Goal: Will remain free from infection Outcome: Progressing Goal: Diagnostic test results will improve Outcome: Progressing Goal: Respiratory complications will improve Outcome: Progressing Goal: Cardiovascular complication will be avoided Outcome: Progressing   Problem: Activity: Goal: Risk for activity intolerance will decrease Outcome: Progressing   Problem: Nutrition: Goal: Adequate nutrition will be maintained Outcome: Progressing   Problem: Coping: Goal: Level of anxiety will decrease Outcome: Progressing   Problem: Elimination: Goal: Will not experience complications related to bowel motility Outcome: Progressing Goal: Will not experience complications related to urinary retention Outcome: Progressing   Problem: Pain Managment: Goal: General experience of comfort will improve Outcome: Progressing   Problem: Safety: Goal: Ability to remain free from injury will improve Outcome: Progressing   Problem: Skin Integrity: Goal: Risk for impaired skin integrity will decrease Outcome: Progressing   Problem: Education: Goal: Expressions of having a comfortable level of knowledge regarding the disease process will increase Outcome: Progressing   Problem: Coping: Goal: Ability to adjust to condition or change in health will improve Outcome: Progressing Goal: Ability to identify appropriate support needs will improve Outcome: Progressing   Problem: Health Behavior/Discharge Planning: Goal:  Compliance with prescribed medication regimen will improve Outcome: Progressing   Problem: Medication: Goal: Risk for medication side effects will decrease Outcome: Progressing   Problem: Clinical Measurements: Goal: Complications related to the disease process, condition or treatment will be avoided or minimized Outcome: Progressing Goal: Diagnostic test results will improve Outcome: Progressing   Problem: Safety: Goal: Verbalization of understanding the information provided will improve Outcome: Progressing   Problem: Self-Concept: Goal: Level of anxiety will decrease Outcome: Progressing Goal: Ability to verbalize feelings about condition will improve Outcome: Progressing

## 2021-01-02 ENCOUNTER — Other Ambulatory Visit (HOSPITAL_COMMUNITY): Payer: Self-pay

## 2021-01-02 ENCOUNTER — Other Ambulatory Visit: Payer: Self-pay | Admitting: Neurology

## 2021-01-02 ENCOUNTER — Other Ambulatory Visit: Payer: Self-pay | Admitting: Internal Medicine

## 2021-01-02 LAB — GLUCOSE, CAPILLARY
Glucose-Capillary: 90 mg/dL (ref 70–99)
Glucose-Capillary: 155 mg/dL — ABNORMAL HIGH (ref 70–99)
Glucose-Capillary: 121 mg/dL — ABNORMAL HIGH (ref 70–99)
Glucose-Capillary: 136 mg/dL — ABNORMAL HIGH (ref 70–99)

## 2021-01-02 LAB — CBC
Hemoglobin: 10.7 g/dL — ABNORMAL LOW (ref 13.0–17.0)
Platelets: 276 10*3/uL (ref 150–400)
WBC: 8.1 10*3/uL (ref 4.0–10.5)

## 2021-01-02 LAB — RENAL FUNCTION PANEL
Albumin: 3.9 g/dL (ref 3.5–5.0)
Anion gap: 16 — ABNORMAL HIGH (ref 5–15)
BUN: 55 mg/dL — ABNORMAL HIGH (ref 6–20)
CO2: 27 mmol/L (ref 22–32)
Calcium: 8.8 mg/dL — ABNORMAL LOW (ref 8.9–10.3)
Chloride: 92 mmol/L — ABNORMAL LOW (ref 98–111)
Creatinine, Ser: 11.77 mg/dL — ABNORMAL HIGH (ref 0.61–1.24)
GFR, Estimated: 5 mL/min — ABNORMAL LOW (ref >60)
Glucose, Bld: 105 mg/dL — ABNORMAL HIGH (ref 70–99)
Phosphorus: 4.6 mg/dL (ref 2.5–4.6)
Potassium: 4.3 mmol/L (ref 3.5–5.1)
Sodium: 135 mmol/L (ref 135–145)

## 2021-01-02 MED ORDER — ALTEPLASE 2 MG IJ SOLR: 2.0000 mg | Freq: Once | INTRAMUSCULAR | Status: DC | PRN

## 2021-01-02 MED ORDER — CALCITRIOL 0.5 MCG PO CAPS
ORAL_CAPSULE | ORAL | Status: AC
  Administered 2021-01-02: 1.75 ug via ORAL
  Filled 2021-01-02: qty 3

## 2021-01-02 MED ORDER — CINACALCET HCL 30 MG PO TABS
ORAL_TABLET | ORAL | Status: AC
  Administered 2021-01-02: 60 mg via ORAL
  Filled 2021-01-02: qty 2

## 2021-01-02 MED ORDER — LIDOCAINE-PRILOCAINE 2.5-2.5 % EX CREA: 1.0000 "application " | TOPICAL_CREAM | CUTANEOUS | Status: DC | PRN

## 2021-01-02 MED ORDER — PENTAFLUOROPROP-TETRAFLUOROETH EX AERO: 1.0000 "application " | INHALATION_SPRAY | CUTANEOUS | Status: DC | PRN

## 2021-01-02 MED ORDER — LIDOCAINE HCL (PF) 1 % IJ SOLN: 5.0000 mL | INTRAMUSCULAR | Status: DC | PRN

## 2021-01-02 MED ORDER — SODIUM CHLORIDE 0.9 % IV SOLN: 100.0000 mL | INTRAVENOUS | Status: DC | PRN

## 2021-01-02 MED ORDER — LACOSAMIDE 200 MG PO TABS
ORAL_TABLET | ORAL | 5 refills | Status: DC
  Filled 2021-01-02: qty 90, 30d supply, fill #0
  Filled 2021-02-05: qty 90, 30d supply, fill #1
  Filled 2021-02-06: qty 90, 35d supply, fill #1

## 2021-01-02 MED ORDER — LAMOTRIGINE 100 MG PO TABS
100.0000 mg | ORAL_TABLET | Freq: Every day | ORAL | Status: DC
  Administered 2021-01-02: 100 mg via ORAL
  Filled 2021-01-02 (×2): qty 1

## 2021-01-02 MED ORDER — MIDODRINE HCL 5 MG PO TABS
ORAL_TABLET | ORAL | Status: AC
  Administered 2021-01-02: 10 mg via ORAL
  Filled 2021-01-02: qty 2

## 2021-01-02 MED ORDER — HEPARIN SODIUM (PORCINE) 1000 UNIT/ML DIALYSIS: 1000.0000 [IU] | INTRAMUSCULAR | Status: DC | PRN

## 2021-01-02 MED ORDER — LAMOTRIGINE 100 MG PO TABS
100.0000 mg | ORAL_TABLET | Freq: Two times a day (BID) | ORAL | Status: DC
  Filled 2021-01-02: qty 1

## 2021-01-02 MED ORDER — CALCITRIOL 0.25 MCG PO CAPS
ORAL_CAPSULE | ORAL | Status: AC
  Filled 2021-01-02: qty 1

## 2021-01-02 NOTE — Procedures (Addendum)
Patient Name: Isaiah [name] [name]  MRN: [id number]  Epilepsy Attending: Lora Havens  Referring Physician/Provider: Dr Zeb Comfort Duration: 01/01/2021 1115 to 01/02/2021 4098  Patient history: 55 year old male with episodes of confusion, hallucination, delusions predominantly at night. EEG to evaluate for seizure  Level of alertness: Awake, asleep  AEDs during EEG study: LTG, LCM, Clonazapem  Technical aspects: This EEG study was done with scalp electrodes positioned according to the 10-20 International system of electrode placement. Electrical activity was acquired at a sampling rate of 500Hz  and reviewed with a high frequency filter of 70Hz  and a low frequency filter of 1Hz . EEG data were recorded continuously and digitally stored.   Description: The posterior dominant rhythm consists of 8 Hz activity of moderate voltage (25-35 uV) seen predominantly in posterior head regions, symmetric and reactive to eye opening and eye closing. Sleep was characterized by vertex waves, sleep spindles (12 to 14 Hz), maximal frontocentral region. Hyperventilation and photic stimulation were not performed.     Wife reported patient called her at around 10 PM on 01/01/2021 and sounded confused. She reports this episode was more subtle compared to the episodes he has at home.  Concomitant EEG before, during and after the event did not show any EEG changes suggest seizure.  IMPRESSION: This study is within normal limits. No seizures or epileptiform discharges were seen throughout the recording.  One event was recorded on 01/01/2021 at around 10 PM. Per patient's wife, he was confused without concomitant EEG change and was most likely not an epileptic event.  Katianne Barre [name]

## 2021-01-02 NOTE — Plan of Care (Signed)
  Problem: Clinical Measurements: Goal: Will remain free from infection Outcome: Progressing   Problem: Education: Goal: Knowledge of General Education information will improve Description: Including pain rating scale, medication(s)/side effects and non-pharmacologic comfort measures Outcome: Progressing   Problem: Safety: Goal: Ability to remain free from injury will improve Outcome: Progressing

## 2021-01-02 NOTE — Consult Note (Signed)
   [facility] Inpatient Consult   01/02/2021  [place] 08-26-1966 [id number]  [facility] [ACO] Patient: Medicare CMS DCE  Patient active in an Embedded practice with the chronic disease management Embedded Care Management team.  Plan: Notification sent to the [facility] and made aware of admission and following for progression and disposition needs.  Please contact for further questions,  Natividad Brood, RN BSN [facility] Liaison  [phone] business mobile phone Toll free office [phone]  Fax number: [phone] [email or web address]

## 2021-01-02 NOTE — Progress Notes (Signed)
LTM maint complete - Serviced Fp1 and A2 [facility] monitored

## 2021-01-02 NOTE — Progress Notes (Signed)
EMU maint complete - no skin issues. Event button tested w [facility]

## 2021-01-02 NOTE — Progress Notes (Addendum)
Subjective: NAEO. On HD.  Per wife, patient was confused on 01/01/2021 at around 10 PM.  He called her and was asking for things, did not know that he is in the hospital.  ROS: negative except above  Examination  Vital signs in last 24 hours: Temp:  [97.2 F (36.2 C)-98.6 F (37 C)] 97.2 F (36.2 C) (04/27 0755) Pulse Rate:  [55-89] 80 (04/27 0830) Resp:  [17-19] 18 (04/27 0755) BP: (108-179)/(41-76) 108/74 (04/27 1000) SpO2:  [97 %-100 %] 98 % (04/27 0830) Weight:  [86.4 kg] 86.4 kg (04/27 0755)  General: lying in bed, not in apparent distress CVS: pulse-normal rate and rhythm RS: breathing comfortably Extremities: normal  Neuro: AOx3,cranial nerves II through XII grossly intact, right upper and lower extremity 4/5, 5/5 in left upper and lower extremities  Basic Metabolic Panel: Recent Labs  Lab 12/31/20 1306 01/02/21 0827  NA 141 135  K 3.7 4.3  CL 95* 92*  CO2 34* 27  GLUCOSE 129* 105*  BUN 24* 55*  CREATININE 7.56* 11.77*  CALCIUM 9.2 8.8*  MG 2.1  --   PHOS 3.5 4.6    CBC: Recent Labs  Lab 12/31/20 1306  WBC 11.5*  NEUTROABS 7.6  HGB 11.0*  HCT 29.5*  MCV 91.9  PLT 278     Coagulation Studies: Recent Labs    12/31/20 1306  LABPROT 14.6  INR 1.1    Imaging No new brain imaging overnight  ASSESSMENT AND PLAN:55 year old male with episodes of confusion, hallucination, delusions predominantly at night.  Transient alteration of awareness Delusions Hallucinations -On video EEG for characterization of spells -Holding vimpat, reducing lamotrigine to 112m qhs, continue Klonopin -Continue seizure precautions -As needed IV Ativan 2 mg clinical seizure-like activity  Diabetes -Continue home medications and SSI  Chronic kidney disease, on dialysis -Continue dialysis on Wednesday and Friday -Continue calcium and ferritin, Rena-Vite  Hyperlipidemia -Continue atorvastatin  GERD -Continue Protonix  Hypotension -Continue  midodrine   I have spent a total of25  minuteswith the patient reviewing hospitalnotes,  test results, labs and examining the patient as well as establishing an assessment and plan that was discussed personally with the patient.>50% of time was spent in direct patient care.  PZeb ComfortEpilepsy [facility] For questions after 5pm please refer to AMION to reach the Neurologist on call

## 2021-01-02 NOTE — Progress Notes (Signed)
Transported to dialysis after voiding and vitals taken.  Transported in bed.

## 2021-01-02 NOTE — Progress Notes (Signed)
[facility] Progress Note   Subjective:   Seen at HD - tolerating treatment fine.  On EEG currently.   Asking about vimpat post HD supplemental dose - d/w RN to give here at end of tx.   Objective Vitals:   01/02/21 0730 01/02/21 0755 01/02/21 0807 01/02/21 0830  BP: (!) 172/58 (!) 151/68 (!) 146/70 139/66  Pulse: 89 76  80  Resp: 18 18    Temp: 98.1 F (36.7 C) (!) 97.2 F (36.2 C)    TempSrc: Oral Oral    SpO2: 99% 98% 97% 98%  Weight:  86.4 kg    Height:       Physical Exam General: on EEG, comfortable and alert Heart: RRR Lungs: clear on RA Abdomen: soft, nontender Extremities: no edema Dialysis Access:  LUE AVF t/b+  Additional Objective Labs: Basic Metabolic Panel: Recent Labs  Lab 12/31/20 1306  NA 141  K 3.7  CL 95*  CO2 34*  GLUCOSE 129*  BUN 24*  CREATININE 7.56*  CALCIUM 9.2  PHOS 3.5   Liver Function Tests: Recent Labs  Lab 12/31/20 1306  AST 22  ALT 20  ALKPHOS 120  BILITOT 1.0  PROT 7.5  ALBUMIN 4.5   No results for input(s): LIPASE, AMYLASE in the last 168 hours. CBC: Recent Labs  Lab 12/31/20 1306  WBC 11.5*  NEUTROABS 7.6  HGB 11.0*  HCT 29.5*  MCV 91.9  PLT 278   Blood Culture    Component Value Date/Time   SDES BLOOD BLOOD LEFT HAND 10/04/2019 0947   SPECREQUEST  10/04/2019 0947    BOTTLES DRAWN AEROBIC AND ANAEROBIC Blood Culture results may not be optimal due to an inadequate volume of blood received in culture bottles   CULT  10/04/2019 0947    NO GROWTH 5 DAYS Performed at [facility], [place] [address] [place], [place] [address]    REPTSTATUS 10/09/2019 FINAL 10/04/2019 0947    Cardiac Enzymes: No results for input(s): CKTOTAL, CKMB, CKMBINDEX, TROPONINI in the last 168 hours. CBG: Recent Labs  Lab 01/01/21 0616 01/01/21 1142 01/01/21 1734 01/01/21 2119 01/02/21 0629  GLUCAP 194* 174* 153* 130* 90   Iron Studies: No results for input(s): IRON, TIBC, TRANSFERRIN, FERRITIN in the  last 72 hours. @lablastinr3 @ Studies/Results: Overnight EEG with video  Result Date: 01/01/2021 Lora Havens, MD     01/02/2021  8:12 AM Patient Name: Isaiah [name] [facility] MRN: [id number] Epilepsy Attending: Lora Havens Referring Physician/Provider: Dr Zeb Comfort Duration: 12/31/2020 1115 to 01/01/2021 1115 Patient history: 55 year old male with episodes of confusion, hallucination, delusions predominantly at night. EEG to evaluate for seizure Level of alertness: Awake, asleep AEDs during EEG study: LTG, LCM, Clonazapem Technical aspects: This EEG study was done with scalp electrodes positioned according to the 10-20 International system of electrode placement. Electrical activity was acquired at a sampling rate of 500Hz  and reviewed with a high frequency filter of 70Hz  and a low frequency filter of 1Hz . EEG data were recorded continuously and digitally stored. Description: The posterior dominant rhythm consists of 8 Hz activity of moderate voltage (25-35 uV) seen predominantly in posterior head regions, symmetric and reactive to eye opening and eye closing. Sleep was characterized by vertex waves, sleep spindles (12 to 14 Hz), maximal frontocentral region. Hyperventilation and photic stimulation were not performed.   IMPRESSION: This study is within normal limits. No seizures or epileptiform discharges were seen throughout the recording. Priyanka [name]   Medications: . sodium chloride    .  sodium chloride     . (feeding supplement) PROSource Plus  30 mL Oral BID BM  . atorvastatin  20 mg Oral QHS  . calcitRIOL  1.75 mcg Oral Q M,W,F-HD  . calcium acetate  667 mg Oral TID WC  . Chlorhexidine Gluconate Cloth  6 each Topical Q0600  . cinacalcet  60 mg Oral Q M,W,F-HD  . clonazePAM  1 mg Oral Q M,W,F  . enoxaparin (LOVENOX) injection  30 mg Subcutaneous Q24H  . feeding supplement (NEPRO CARB STEADY)  237 mL Oral BID BM  . ferric citrate  210 mg Oral TID WC  . insulin aspart  2-4 Units  Subcutaneous TID WC  . insulin glargine  6 Units Subcutaneous q morning  . lacosamide  200 mg Oral BID   And  . lacosamide  200 mg Oral Q M,W,F  . lamoTRIgine  200 mg Oral BID  . midodrine  10 mg Oral BID   And  . midodrine  10 mg Oral Q M,W,F  . multivitamin  1 tablet Oral QHS  . pantoprazole  40 mg Oral Daily  . prednisoLONE acetate  1 drop Right Eye TID    Assessment/Recommendations:   ESRD: -Outpatient orders: 4 hours, F180, 2K, 2Ca, BFR/DFR: 400/500 -no heparin -HD today, plan to keep him on his MWF while he's admitted  Volume/ hypotension: EDW 84kg. Attempt to achieve EDW as tolerated, cont midodrine  Anemia of Chronic Kidney Disease: Hemoglobin 11. Currently receiving mircera 33mg q4weeks, last dose 12/26/20, can wait for his next scheduled dose.   Secondary Hyperparathyroidism/Hyperphosphatemia: resuming calcitriol 1.724m qtreatment, sensipar 6034mtreatment   Vascular access: LUE AVF +b/t  Hallucinations, delusions, transient alteration of awareness -EEG neg for seizures, per neuro  But is on EEG still  -Per above give supplemental vimpat at end of HD here.   LinJannifer Hick 01/02/2021, 8:41 AM  [facility] Pager: [phone]

## 2021-01-03 ENCOUNTER — Other Ambulatory Visit (HOSPITAL_COMMUNITY): Payer: Self-pay

## 2021-01-03 LAB — GLUCOSE, CAPILLARY
Glucose-Capillary: 101 mg/dL — ABNORMAL HIGH (ref 70–99)
Glucose-Capillary: 155 mg/dL — ABNORMAL HIGH (ref 70–99)
Glucose-Capillary: 133 mg/dL — ABNORMAL HIGH (ref 70–99)
Glucose-Capillary: 83 mg/dL (ref 70–99)

## 2021-01-03 MED ORDER — ONDANSETRON HCL 4 MG/2ML IJ SOLN
INTRAMUSCULAR | Status: AC
  Administered 2021-01-03: 4 mg via INTRAVENOUS
  Filled 2021-01-03: qty 2

## 2021-01-03 MED ORDER — ONDANSETRON HCL 4 MG/2ML IJ SOLN: 4.0000 mg | Freq: Once | INTRAMUSCULAR | Status: AC

## 2021-01-03 MED ORDER — CHLORHEXIDINE GLUCONATE CLOTH 2 % EX PADS
6.0000 | MEDICATED_PAD | Freq: Every day | CUTANEOUS | Status: DC
  Administered 2021-01-03 – 2021-01-04 (×2): 6 via TOPICAL

## 2021-01-03 NOTE — Progress Notes (Signed)
maint complete.  

## 2021-01-03 NOTE — Progress Notes (Signed)
Pt's [name] called and requested MD contact her tomorrow. Her number is [phone]

## 2021-01-03 NOTE — Progress Notes (Addendum)
Subjective: No acute events overnight.  No new concerns.  ROS: negative except above  Examination  Vital signs in last 24 hours: Temp:  [97.6 F (36.4 C)-98.4 F (36.9 C)] 97.6 F (36.4 C) (04/28 0823) Pulse Rate:  [49-113] 62 (04/28 0823) Resp:  [16-20] 16 (04/28 0823) BP: (63-157)/(43-76) 129/76 (04/28 0823) SpO2:  [98 %-100 %] 100 % (04/28 0823) Weight:  [84.4 kg] 84.4 kg (04/27 1215)  General: lying in bed,not in apparent distress CVS: pulse-normal rate and rhythm RS: breathing comfortably Extremities: normal  Neuro:AOx3,cranial nerves II through XII grossly intact, right upper and lower extremity 4/5, 5/5 in left upper and lower extremities  Basic Metabolic Panel: Recent Labs  Lab 12/31/20 1306 01/02/21 0827  NA 141 135  K 3.7 4.3  CL 95* 92*  CO2 34* 27  GLUCOSE 129* 105*  BUN 24* 55*  CREATININE 7.56* 11.77*  CALCIUM 9.2 8.8*  MG 2.1  --   PHOS 3.5 4.6    CBC: Recent Labs  Lab 12/31/20 1306 01/02/21 0827  WBC 11.5* 8.1  NEUTROABS 7.6  --   HGB 11.0* 10.7*  HCT 29.5* RESULTS UNAVAILABLE DUE TO INTERFERING SUBSTANCE  MCV 91.9 RESULTS UNAVAILABLE DUE TO INTERFERING SUBSTANCE  PLT 278 276     Coagulation Studies: Recent Labs    12/31/20 1306  LABPROT 14.6  INR 1.1    Imaging No new brain imaging overnight  ASSESSMENT AND PLAN:55 year old male with episodes of confusion, hallucination, delusions predominantly at night.  Transient alteration of awareness Delusions Hallucinations -On video EEG forcharacterization of spells -Holding vimpat, lamotrigine, continue Klonopin -Continue seizure precautions -As needed IV Ativan 2 mg clinical seizure-like activity  Diabetes -Continue home medications and SSI  Chronic kidney disease, on dialysis -Continuedialysis on MWF -Continue calcium and ferritin, Rena-Vite  Hyperlipidemia -Continue atorvastatin  GERD -Continue Protonix  Hypotension -Continue midodrine   ADDENDUM -  RN notified that patient had an episode of emesis after lunch. Ordered IV zofran 45m once.    I have spent a total of220muteswith the patient reviewing hospitalnotes, test results, labs and examining the patient as well as establishing an assessment and plan that was discussed personally with the patient.>50% of time was spent in direct patient care.   PrZeb Comfortpilepsy [facility] For questions after 5pm please refer to AMION to reach the Neurologist on call

## 2021-01-03 NOTE — Progress Notes (Signed)
Pt had large amount of emesis, stated it is occurring with meals, and that they take compazine at home for N/V, Provider notified. Pt received one time dose of Zofran earlier this afternoon. Awaiting new orders

## 2021-01-03 NOTE — Plan of Care (Signed)
  Problem: Education: Goal: Knowledge of General Education information will improve Description: Including pain rating scale, medication(s)/side effects and non-pharmacologic comfort measures Outcome: Progressing   Problem: Health Behavior/Discharge Planning: Goal: Ability to manage health-related needs will improve Outcome: Progressing   Problem: Clinical Measurements: Goal: Ability to maintain clinical measurements within normal limits will improve Outcome: Progressing Goal: Will remain free from infection Outcome: Progressing Goal: Diagnostic test results will improve Outcome: Progressing Goal: Respiratory complications will improve Outcome: Progressing Goal: Cardiovascular complication will be avoided Outcome: Progressing   Problem: Activity: Goal: Risk for activity intolerance will decrease Outcome: Progressing   Problem: Nutrition: Goal: Adequate nutrition will be maintained Outcome: Progressing   Problem: Coping: Goal: Level of anxiety will decrease Outcome: Progressing   Problem: Elimination: Goal: Will not experience complications related to bowel motility Outcome: Progressing Goal: Will not experience complications related to urinary retention Outcome: Progressing   Problem: Pain Managment: Goal: General experience of comfort will improve Outcome: Progressing   Problem: Safety: Goal: Ability to remain free from injury will improve Outcome: Progressing   Problem: Skin Integrity: Goal: Risk for impaired skin integrity will decrease Outcome: Progressing   Problem: Education: Goal: Expressions of having a comfortable level of knowledge regarding the disease process will increase Outcome: Progressing   Problem: Coping: Goal: Ability to adjust to condition or change in health will improve Outcome: Progressing Goal: Ability to identify appropriate support needs will improve Outcome: Progressing   Problem: Health Behavior/Discharge Planning: Goal:  Compliance with prescribed medication regimen will improve Outcome: Progressing   Problem: Medication: Goal: Risk for medication side effects will decrease Outcome: Progressing   Problem: Clinical Measurements: Goal: Complications related to the disease process, condition or treatment will be avoided or minimized Outcome: Progressing Goal: Diagnostic test results will improve Outcome: Progressing   Problem: Safety: Goal: Verbalization of understanding the information provided will improve Outcome: Progressing   Problem: Self-Concept: Goal: Level of anxiety will decrease Outcome: Progressing Goal: Ability to verbalize feelings about condition will improve Outcome: Progressing

## 2021-01-03 NOTE — Care Management Important Message (Signed)
Important Message  Patient Details  Name: Isaiah [name] [facility] MRN: [id number] Date of Birth: 12/04/1965   Medicare Important Message Given:  Yes     Isaiah [name] [address] 01/03/2021, 12:15 PM

## 2021-01-03 NOTE — Procedures (Addendum)
Patient Name:Isaiah [name] IHD:[id number] Epilepsy Attending:Louise Victory [name] Referring Physician/Provider:Dr Zeb Comfort Duration:01/02/2021 1259 to 01/03/2021 1259  Patient history:55 year old male with episodes of confusion, hallucination, delusions predominantly at night.EEG to evaluate for seizure  Level of alertness:Awake, asleep  AEDs during EEG study:LTG  Technical aspects: This EEG study was done with scalp electrodes positioned according to the 10-20 International system of electrode placement. Electrical activity was acquired at a sampling rate of 500Hz  and reviewed with a high frequency filter of 70Hz  and a low frequency filter of 1Hz . EEG data were recorded continuously and digitally stored.   Description: The posterior dominant rhythm consists of8Hz  activity of moderate voltage (25-35 uV) seen predominantly in posterior head regions, symmetric and reactive to eye opening and eye closing. Sleep was characterized by vertex waves, sleep spindles (12 to 14 Hz), maximal frontocentral region. Hyperventilation and photic stimulation were not performed.   IMPRESSION: This study is within normal limits. No seizures or epileptiform discharges were seen throughout the recording.  Isaiah [name] [name]

## 2021-01-03 NOTE — Progress Notes (Signed)
Isaiah [name] Progress Note   Subjective:   Seated in recliner. Reports he is feeling well, no concerns. Tolerated HD yesterday with net UF 1.8L. Denies SOB, CP, palpitations, dizziness, abdominal pain, nausea and edema.   Objective Vitals:   01/02/21 2016 01/02/21 2334 01/03/21 0335 01/03/21 0823  BP: (!) 157/65 (!) 147/58 138/71 129/76  Pulse: (!) 49 (!) 57 (!) 53 62  Resp: 17 17 17 16   Temp: 98.1 F (36.7 C) 98.4 F (36.9 C) 98.4 F (36.9 C) 97.6 F (36.4 C)  TempSrc: Oral Oral Oral Oral  SpO2: 99% 98% 98% 100%  Weight:      Height:       Physical Exam General: WDWN male, EEG ongoing. Alert and in NAD Heart: RRR, no murmur Lungs: CTA bilaterally without wheezing, rhonchi or rales Abdomen: Soft, non-tender, non-distended, +BS Extremities: No edema b/l lower extremities Dialysis Access: LUE AVF +bruit  Additional Objective Labs: Basic Metabolic Panel: Recent Labs  Lab 12/31/20 1306 01/02/21 0827  NA 141 135  K 3.7 4.3  CL 95* 92*  CO2 34* 27  GLUCOSE 129* 105*  BUN 24* 55*  CREATININE 7.56* 11.77*  CALCIUM 9.2 8.8*  PHOS 3.5 4.6   Liver Function Tests: Recent Labs  Lab 12/31/20 1306 01/02/21 0827  AST 22  --   ALT 20  --   ALKPHOS 120  --   BILITOT 1.0  --   PROT 7.5  --   ALBUMIN 4.5 3.9   CBC: Recent Labs  Lab 12/31/20 1306 01/02/21 0827  WBC 11.5* 8.1  NEUTROABS 7.6  --   HGB 11.0* 10.7*  HCT 29.5* RESULTS UNAVAILABLE DUE TO INTERFERING SUBSTANCE  MCV 91.9 RESULTS UNAVAILABLE DUE TO INTERFERING SUBSTANCE  PLT 278 276   Blood Culture    Component Value Date/Time   SDES BLOOD BLOOD LEFT HAND 10/04/2019 0947   SPECREQUEST  10/04/2019 0947    BOTTLES DRAWN AEROBIC AND ANAEROBIC Blood Culture results may not be optimal due to an inadequate volume of blood received in culture bottles   CULT  10/04/2019 0947    NO GROWTH 5 DAYS Performed at [facility], [place] [address], [place], [place] [address]    REPTSTATUS  10/09/2019 FINAL 10/04/2019 0947   CBG: Recent Labs  Lab 01/02/21 0629 01/02/21 1302 01/02/21 1619 01/02/21 2125 01/03/21 0612  GLUCAP 90 155* 121* 136* 101*   Medications:  . (feeding supplement) PROSource Plus  30 mL Oral BID BM  . atorvastatin  20 mg Oral QHS  . calcitRIOL  1.75 mcg Oral Q M,W,F-HD  . calcium acetate  667 mg Oral TID WC  . Chlorhexidine Gluconate Cloth  6 each Topical Q0600  . cinacalcet  60 mg Oral Q M,W,F-HD  . clonazePAM  1 mg Oral Q M,W,F  . enoxaparin (LOVENOX) injection  30 mg Subcutaneous Q24H  . feeding supplement (NEPRO CARB STEADY)  237 mL Oral BID BM  . ferric citrate  210 mg Oral TID WC  . insulin aspart  2-4 Units Subcutaneous TID WC  . insulin glargine  6 Units Subcutaneous q morning  . lamoTRIgine  100 mg Oral QHS  . midodrine  10 mg Oral BID   And  . midodrine  10 mg Oral Q M,W,F  . multivitamin  1 tablet Oral QHS  . pantoprazole  40 mg Oral Daily  . prednisoLONE acetate  1 drop Right Eye TID    Dialysis Orders:  -Outpatient orders:4 hours, F180, 2K,  2Ca, BFR/DFR: 400/500 -no heparin -HD today, plan to keep him on his MWF while he's admitted  Assessment/Plan:  ESRD: -tolerating HD well, continue MWF schedule.   Volume/ hypotension: [id number]. Close to EDW after HD yesterday, UF to EDW again as tolerated tomorrow. Continue midodrine.   Anemia of Chronic Kidney Disease:Hemoglobin 10.7. Currently receivingmircera 7mg q4weeks, last dose 12/26/20, can wait for his next scheduled dose.   Secondary Hyperparathyroidism/Hyperphosphatemia:Calcium and phos controlled. Resuming calcitriol 1.748m qtreatment, sensipar 6035mtreatment  Vascular access:LUE AVF +b/t  Hallucinations, delusions, transient alteration of awareness -EEG neg for seizures, per neuro  But is on EEG still  -Per above give supplemental vimpat at end of HD here.   SamAnice PaganiniA-C 01/03/2021, 10:00 AM  [name] Pager: [phone]

## 2021-01-04 ENCOUNTER — Other Ambulatory Visit (HOSPITAL_COMMUNITY): Payer: Self-pay

## 2021-01-04 DIAGNOSIS — R569 Unspecified convulsions: Secondary | ICD-10-CM

## 2021-01-04 DIAGNOSIS — G40009 Localization-related (focal) (partial) idiopathic epilepsy and epileptic syndromes with seizures of localized onset, not intractable, without status epilepticus: Secondary | ICD-10-CM

## 2021-01-04 LAB — CBC
HCT: 28.6 % — ABNORMAL LOW (ref 39.0–52.0)
Hemoglobin: 10.7 g/dL — ABNORMAL LOW (ref 13.0–17.0)
MCH: 34.1 pg — ABNORMAL HIGH (ref 26.0–34.0)
MCHC: 37.4 g/dL — ABNORMAL HIGH (ref 30.0–36.0)
MCV: 91.1 fL (ref 80.0–100.0)
Platelets: 210 10*3/uL (ref 150–400)
RBC: 3.14 MIL/uL — ABNORMAL LOW (ref 4.22–5.81)
RDW: 13.6 % (ref 11.5–15.5)
WBC: 9.5 10*3/uL (ref 4.0–10.5)
nRBC: 1.5 % — ABNORMAL HIGH (ref 0.0–0.2)

## 2021-01-04 LAB — HEPATITIS B CORE ANTIBODY, TOTAL: Hep B Core Total Ab: NONREACTIVE

## 2021-01-04 LAB — GLUCOSE, CAPILLARY
Glucose-Capillary: 77 mg/dL (ref 70–99)
Glucose-Capillary: 114 mg/dL — ABNORMAL HIGH (ref 70–99)
Glucose-Capillary: 121 mg/dL — ABNORMAL HIGH (ref 70–99)
Glucose-Capillary: 115 mg/dL — ABNORMAL HIGH (ref 70–99)

## 2021-01-04 LAB — RENAL FUNCTION PANEL
Albumin: 3.9 g/dL (ref 3.5–5.0)
Anion gap: 11 (ref 5–15)
BUN: 42 mg/dL — ABNORMAL HIGH (ref 6–20)
CO2: 32 mmol/L (ref 22–32)
Calcium: 8.7 mg/dL — ABNORMAL LOW (ref 8.9–10.3)
Chloride: 91 mmol/L — ABNORMAL LOW (ref 98–111)
Creatinine, Ser: 10.44 mg/dL — ABNORMAL HIGH (ref 0.61–1.24)
GFR, Estimated: 5 mL/min — ABNORMAL LOW (ref >60)
Glucose, Bld: 102 mg/dL — ABNORMAL HIGH (ref 70–99)
Phosphorus: 5.5 mg/dL — ABNORMAL HIGH (ref 2.5–4.6)
Potassium: 4.4 mmol/L (ref 3.5–5.1)
Sodium: 134 mmol/L — ABNORMAL LOW (ref 135–145)

## 2021-01-04 LAB — HEPATITIS B SURFACE ANTIGEN: Hepatitis B Surface Ag: NONREACTIVE

## 2021-01-04 MED ORDER — BREO ELLIPTA 200-25 MCG/INH IN AEPB
INHALATION_SPRAY | RESPIRATORY_TRACT | 2 refills | Status: DC
Start: 2021-01-04 — End: 2021-05-27
  Filled 2021-01-04: qty 60, 30d supply, fill #0
  Filled 2021-02-06: qty 60, 30d supply, fill #1
  Filled 2021-03-28: qty 60, 30d supply, fill #2

## 2021-01-04 MED ORDER — CLONAZEPAM 0.5 MG PO TABS: 0.5000 mg | ORAL_TABLET | Freq: Every evening | ORAL | 3 refills | Status: DC

## 2021-01-04 MED ORDER — LACOSAMIDE 200 MG PO TABS
200.0000 mg | ORAL_TABLET | Freq: Once | ORAL | Status: DC
  Filled 2021-01-04: qty 1

## 2021-01-04 MED ORDER — LACOSAMIDE 200 MG PO TABS
200.0000 mg | ORAL_TABLET | Freq: Two times a day (BID) | ORAL | Status: DC
  Administered 2021-01-04 – 2021-01-05 (×2): 200 mg via ORAL
  Filled 2021-01-04 (×2): qty 1

## 2021-01-04 MED ORDER — CLONAZEPAM 0.5 MG PO TABS: 0.5000 mg | ORAL_TABLET | Freq: Every day | ORAL | 0 refills | Status: DC

## 2021-01-04 MED ORDER — SODIUM CHLORIDE 0.9 % IV SOLN
200.0000 mg | INTRAVENOUS | Status: AC
  Administered 2021-01-04: 200 mg via INTRAVENOUS
  Filled 2021-01-04: qty 20

## 2021-01-04 MED ORDER — LAMOTRIGINE 100 MG PO TABS
200.0000 mg | ORAL_TABLET | Freq: Once | ORAL | Status: AC
  Administered 2021-01-04: 200 mg via ORAL
  Filled 2021-01-04: qty 2

## 2021-01-04 MED ORDER — CLONAZEPAM 0.5 MG PO TABS
0.5000 mg | ORAL_TABLET | Freq: Every day | ORAL | Status: DC
  Administered 2021-01-04: 0.5 mg via ORAL
  Filled 2021-01-04: qty 1

## 2021-01-04 MED ORDER — LAMOTRIGINE 100 MG PO TABS
200.0000 mg | ORAL_TABLET | Freq: Two times a day (BID) | ORAL | Status: DC
  Administered 2021-01-04 – 2021-01-05 (×2): 200 mg via ORAL
  Filled 2021-01-04 (×2): qty 2

## 2021-01-04 NOTE — Progress Notes (Signed)
[facility] Progress Note   Subjective:   Pt seen in room, on HD. Reports he was nauseated every time he ate yesterday but is feeling better today. Denies SOB, CP, palpitations, dizziness, abdominal pain.   Objective Vitals:   01/04/21 0920 01/04/21 0930 01/04/21 0932 01/04/21 0945  BP: 125/62  134/66 130/64  Pulse: 78 76 76 74  Resp: 15 20 18 17   Temp:      TempSrc:      SpO2: 97% 97% 97% 98%  Weight:      Height:       Physical Exam General: WDWN male, EEG ongoing. Alert and in NAD Heart: RRR, no murmur Lungs: CTA bilaterally without wheezing, rhonchi or rales Abdomen: Soft, non-tender, non-distended, +BS Extremities: No edema b/l lower extremities Dialysis Access: LUE AVF +bruit  Additional Objective Labs: Basic Metabolic Panel: Recent Labs  Lab 12/31/20 1306 01/02/21 0827 01/04/21 0612  NA 141 135 134*  K 3.7 4.3 4.4  CL 95* 92* 91*  CO2 34* 27 32  GLUCOSE 129* 105* 102*  BUN 24* 55* 42*  CREATININE 7.56* 11.77* 10.44*  CALCIUM 9.2 8.8* 8.7*  PHOS 3.5 4.6 5.5*   Liver Function Tests: Recent Labs  Lab 12/31/20 1306 01/02/21 0827 01/04/21 0612  AST 22  --   --   ALT 20  --   --   ALKPHOS 120  --   --   BILITOT 1.0  --   --   PROT 7.5  --   --   ALBUMIN 4.5 3.9 3.9   CBC: Recent Labs  Lab 12/31/20 1306 01/02/21 0827  WBC 11.5* 8.1  NEUTROABS 7.6  --   HGB 11.0* 10.7*  HCT 29.5* RESULTS UNAVAILABLE DUE TO INTERFERING SUBSTANCE  MCV 91.9 RESULTS UNAVAILABLE DUE TO INTERFERING SUBSTANCE  PLT 278 276   Blood Culture    Component Value Date/Time   SDES BLOOD BLOOD LEFT HAND 10/04/2019 0947   SPECREQUEST  10/04/2019 0947    BOTTLES DRAWN AEROBIC AND ANAEROBIC Blood Culture results may not be optimal due to an inadequate volume of blood received in culture bottles   CULT  10/04/2019 0947    NO GROWTH 5 DAYS Performed at [facility], [place] [address] [place], [place] [address]    REPTSTATUS 10/09/2019 FINAL 10/04/2019 0947     CBG: Recent Labs  Lab 01/03/21 0612 01/03/21 1140 01/03/21 1543 01/03/21 2109 01/04/21 0626  GLUCAP 101* 155* 133* 83 77   Medications:  . (feeding supplement) PROSource Plus  30 mL Oral BID BM  . atorvastatin  20 mg Oral QHS  . calcitRIOL  1.75 mcg Oral Q M,W,F-HD  . calcium acetate  667 mg Oral TID WC  . Chlorhexidine Gluconate Cloth  6 each Topical Q0600  . Chlorhexidine Gluconate Cloth  6 each Topical Q0600  . cinacalcet  60 mg Oral Q M,W,F-HD  . clonazePAM  1 mg Oral Q M,W,F  . enoxaparin (LOVENOX) injection  30 mg Subcutaneous Q24H  . feeding supplement (NEPRO CARB STEADY)  237 mL Oral BID BM  . ferric citrate  210 mg Oral TID WC  . insulin aspart  2-4 Units Subcutaneous TID WC  . insulin glargine  6 Units Subcutaneous q morning  . lacosamide  200 mg Oral Once  . lamoTRIgine  200 mg Oral Once  . midodrine  10 mg Oral BID   And  . midodrine  10 mg Oral Q M,W,F  . multivitamin  1 tablet  Oral QHS  . pantoprazole  40 mg Oral Daily  . prednisoLONE acetate  1 drop Right Eye TID    Dialysis Orders: -Outpatient orders:4 hours, F180, 2K, 2Ca, BFR/DFR: 400/500  Assessment/Plan: ESRD: -tolerating HD well, continue MWF schedule.  -no heparin  Volume/ hypotension: [id number]. UF to EDW again as tolerated today. Continue midodrine.   Anemia of Chronic Kidney Disease:Hemoglobin 10.7. Currently receivingmircera 76mg q4weeks, last dose 12/26/20, can wait for his next scheduled dose.   Secondary Hyperparathyroidism/Hyperphosphatemia:Calcium and phos controlled. Resuming calcitriol 1.768m qtreatment, sensipar 6024mtreatment  Vascular access:LUE AVF +b/t  Hallucinations, delusions, transient alteration of awareness -EEG neg for seizures so far, per neuro  SamAnice PaganiniA-C 01/04/2021, 10:01 AM  [facility] Pager: [phone]

## 2021-01-04 NOTE — Progress Notes (Signed)
EMU  maint complete -  A1 and O1  serviced.. Patient was awake with door open upon arrival of tech.  [facility] monitored .

## 2021-01-04 NOTE — Discharge Instructions (Addendum)
Isaiah [name] was admitted to epilepsy monitoring unit from 12/31/2020 to 01/04/2021.  During this time, patient was on continuous video EEG monitoring, antiseizure medications were stopped as seizure provocative measures. One event was recorded on on 01/01/2021 at around 10 PM. Per patient's wife, he was confused without concomitant EEG change and was most likely not an epileptic event.  1 seizure was recorded on 01/04/2021 around noon arising from left frontotemporal region. The episodes described by wife are different from the focal seizure we captured.  Differentials for these episodes include frontal lobe seizures versus seizure disorder versus dementia related confusion (delusions of infidelity, audio, visual and gustatory hallucinations).  At this point, patient is already on Vimpat 200 mg twice daily and lamotrigine 200 mg twice daily.  Due to low suspicion for seizures, I would recommend adding Klonopin 0.5 mg at bedtime which can be increased to 77m at bedtime.  If episodes persist, can consider adding low-dose Seroquel.  I would also recommend EEG and neuropsych evaluation for dementia.  Continue seizure precautions.  Follow-up with Dr. ADelice Lesch(primary neurologist).

## 2021-01-04 NOTE — Plan of Care (Signed)
  Problem: Education: Goal: Knowledge of General Education information will improve Description: Including pain rating scale, medication(s)/side effects and non-pharmacologic comfort measures Outcome: Progressing   Problem: Health Behavior/Discharge Planning: Goal: Ability to manage health-related needs will improve Outcome: Progressing   Problem: Clinical Measurements: Goal: Ability to maintain clinical measurements within normal limits will improve Outcome: Progressing Goal: Will remain free from infection Outcome: Progressing Goal: Diagnostic test results will improve Outcome: Progressing Goal: Respiratory complications will improve Outcome: Progressing Goal: Cardiovascular complication will be avoided Outcome: Progressing   Problem: Activity: Goal: Risk for activity intolerance will decrease Outcome: Progressing   Problem: Coping: Goal: Level of anxiety will decrease Outcome: Progressing   Problem: Elimination: Goal: Will not experience complications related to bowel motility Outcome: Progressing Goal: Will not experience complications related to urinary retention Outcome: Progressing   Problem: Pain Managment: Goal: General experience of comfort will improve Outcome: Progressing   Problem: Safety: Goal: Ability to remain free from injury will improve Outcome: Progressing   Problem: Skin Integrity: Goal: Risk for impaired skin integrity will decrease Outcome: Progressing   Problem: Education: Goal: Expressions of having a comfortable level of knowledge regarding the disease process will increase Outcome: Progressing   Problem: Coping: Goal: Ability to adjust to condition or change in health will improve Outcome: Progressing Goal: Ability to identify appropriate support needs will improve Outcome: Progressing   Problem: Health Behavior/Discharge Planning: Goal: Compliance with prescribed medication regimen will improve Outcome: Progressing   Problem:  Medication: Goal: Risk for medication side effects will decrease Outcome: Progressing   Problem: Clinical Measurements: Goal: Complications related to the disease process, condition or treatment will be avoided or minimized Outcome: Progressing Goal: Diagnostic test results will improve Outcome: Progressing   Problem: Safety: Goal: Verbalization of understanding the information provided will improve Outcome: Progressing   Problem: Self-Concept: Goal: Level of anxiety will decrease Outcome: Progressing Goal: Ability to verbalize feelings about condition will improve Outcome: Progressing

## 2021-01-04 NOTE — Discharge Summary (Incomplete Revision)
Physician Discharge Summary  Patient ID: Hurlock MRN: [id number] DOB/AGE: 1965-09-15 55 y.o.  Admit date: 12/31/2020 Discharge date: 01/04/2021  Admission Diagnoses: Transient alteration of awareness  Discharge Diagnoses: Transient alteration of awareness  Discharged Condition: stable  Hospital Course: [name] was admitted to epilepsy monitoring unit from 12/31/2020 to 01/04/2021.  During this time, patient was on continuous video EEG monitoring, antiseizure medications were stopped as seizure provocative measures. One event was recorded on on 01/01/2021 at around 10 PM. Per patient's wife, he was confused without concomitant EEG change and was most likely not an epileptic event.  One focal seizure was recorded on 01/04/2021 that are unknown.  The semiology of patient's focal seizure is different from the episodes patient's wife is describing.  Differentials for these episodes include frontal lobe seizures versus seizure disorder versus dementia related confusion (delusions of infidelity, audio, visual and gustatory hallucinations).  At this point, patient is already on Vimpat 200 mg twice daily and lamotrigine 200 mg twice daily.  Due to low suspicion for seizures, I would recommend adding Klonopin 0.5 mg at bedtime. If episodes persist, can consider    adding low-dose Seroquel.  I would also recommend EEG and neuropsych evaluation for dementia.  Continue seizure precautions.  Follow-up with Dr. Delice Lesch (primary neurologist).  Consults: None  Significant Diagnostic Studies:   EEG Description: The posterior dominant rhythm consists of8Hz  activity of moderate voltage (25-35 uV) seen predominantly in posterior head regions, symmetric and reactive to eye opening and eye closing. Sleep was characterized by vertex waves, sleep spindles (12 to 14 Hz), maximal frontocentral region. Hyperventilation and photic stimulation were not performed.   Event button was pressed on 01/04/2021 at 1203.   Patient was sitting in bed suddenly had right gaze deviation followed by right head version and body turning to right followed by left arm jerking, gaze deviation to left and up and eventually generalized tonic-clonic seizures. Concomitant EEG initially showed sharp wave in left frontotemporal region. Rest of the EEG was difficult to interpret due to significant movement and electrode artifact.  Seizure ended at 1204 after which EEG showed generalized EEG suppression.  IMPRESSION: This study showed one seizure on 01/04/2021 at 1203 arising from left frontotemporal region as described above, lasting about 1 minute.   One event was recorded on 01/01/2021 at around 10 PM. Per patient's wife, he was confused without concomitant EEG change and was most likely not an epileptic event.  Treatments: Continue home medication, start Klonopin 0.5 mg daily at bedtime.  Discharge Exam: Blood pressure 129/83, pulse (!) 109, temperature 99.6 F (37.6 C), temperature source Axillary, resp. rate 20, height 5' 11"  (1.803 m), weight 87.6 kg, SpO2 94 %.  General: lying in bed,not in apparent distress CVS: pulse-normal rate and rhythm RS: breathing comfortably Extremities: normal  Neuro:AOx3,cranial nerves II through XII grossly intact, right upper and lower extremity 4/5, 5/5 in left upper and lower extremities  Disposition: Home   Allergies as of 01/04/2021      Reactions   Codeine Swelling   Codeine Rash, Other (See Comments)   Unknown reaction (patient says it was more serious than just a rash, but he can't remember what happened)      Medication List    TAKE these medications   acetaminophen 325 MG tablet Commonly known as: TYLENOL Take 1-2 tablets (325-650 mg total) by mouth every 4 (four) hours as needed for mild pain.   albuterol 108 (90 Base) MCG/ACT inhaler Commonly known as: ProAir  HFA Inhale two puffs every 4-6 hours if needed for cough or wheeze What changed:   how much to  take  how to take this  when to take this  reasons to take this   atorvastatin 20 MG tablet Commonly known as: LIPITOR TAKE 1 TABLET BY MOUTH ONCE A DAY What changed: how much to take   Auryxia 1 GM 210 MG(Fe) tablet Generic drug: ferric citrate Take 210 mg by mouth every evening.   Basaglar KwikPen 100 UNIT/ML INJECT 6 UNITS UNDER THE SKIN ONCE A DAY (DISCARD 28 DAYS AFTER FIRST USE) What changed:   how much to take  how to take this  when to take this   Breo Ellipta 200-25 MCG/INH Aepb Generic drug: fluticasone furoate-vilanterol INHALE 1 PUFF BY MOUTH INTO THE LUNGS AT THE SAME TIME EVERY DAY   calcitRIOL 0.25 MCG capsule Commonly known as: ROCALTROL Take 0.25 mcg by mouth daily.   calcium acetate 667 MG capsule Commonly known as: PHOSLO TAKE 3 CAPSULES BY MOUTH THREE TIMES DAILY WITH MEALS AND 2 CAPSULES TWO TIMES DAILY WITH SNACKS What changed:   how much to take  how to take this  when to take this   camphor-menthol lotion Commonly known as: SARNA Apply 1 application topically every 8 (eight) hours as needed for itching.   Cetirizine HCl 10 MG Caps Take 1 capsule by mouth daily.   clonazePAM 0.5 MG tablet Commonly known as: KLONOPIN TAKE 2 TABLETS EVERY MONDAY, WEDNESDAY, AND FRIDAY WITH HEMODIALYSIS What changed:   how much to take  how to take this  when to take this  additional instructions   clonazePAM 0.5 MG tablet Commonly known as: KlonoPIN Take 1 tablet (0.5 mg total) by mouth at bedtime. Also take 39m in morning on Monday, Wednesday, Friday before dialysis What changed: You were already taking a medication with the same name, and this prescription was added. Make sure you understand how and when to take each.   D3-1000 PO Take 1 tablet by mouth See admin instructions. Take one tablet by mouth on Monday,Wednesday and Fridays   dexamethasone 4 MG tablet Commonly known as: DECADRON Take 4 mg by mouth daily.   dextrose 50 %  solution Dextrose 50%   freestyle lancets   FREESTYLE LITE test strip Generic drug: glucose blood   Glucagon Emergency 1 MG Kit See admin instructions.   HUMALOG KWIKPEN [place] Inject 2-4 Units into the skin 3 (three) times daily before meals. Sliding scale: 150-250 =2 units, 250 -350=3 units, 350 += 4 units   lacosamide 200 MG Tabs tablet Commonly known as: Vimpat TAKE 1 TABLET TWICE A DAY EXCEPT ON DIALYSIS DAYS M-W-F GIVE 1 TAB IN AM, 1 TAB AFTER DIALYSIS, 1 TAB IN PM. What changed:   how much to take  how to take this  when to take this  additional instructions   lamoTRIgine 200 MG tablet Commonly known as: LAMICTAL TAKE 1 TABLET BY MOUTH 2 TIMES DAILY What changed: Another medication with the same name was removed. Continue taking this medication, and follow the directions you see here.   midodrine 10 MG tablet Commonly known as: PROAMATINE TAKE 1 TABLET BY MOUTH 2 TIMES DAILY WITH MEALS What changed:   how much to take  how to take this  when to take this  additional instructions   multivitamin Tabs tablet Take 1 tablet by mouth at bedtime.   pantoprazole 40 MG tablet Commonly known as: PROTONIX TAKE 1 TABLET BY  MOUTH ONCE DAILY   prednisoLONE acetate 1 % ophthalmic suspension Commonly known as: PRED FORTE Instill 1 drop Right Eye 3 times a day   SM Alcohol Prep 70 % Pads   SYSTANE OP Place 1 drop into the left eye daily as needed (For drops).   Unifine Pentips 31G X 8 MM Misc Generic drug: Insulin Pen Needle USE AS DIRECTED DAILY AFTER SUPPER   Unifine Pentips 32G X 4 MM Misc Generic drug: Insulin Pen Needle USE AS DIRECTED 4 TO 5 TIMES DAILY TO INJECT INSULIN       I have spent a total of  35  minutes with the patient reviewing hospital notes,  test results, labs and examining the patient as well as establishing an assessment and plan that was discussed personally with the patient and his wife.  > 50% of time was spent in direct patient  care.   Signed: Lora Havens 01/04/2021, 1:37 PM

## 2021-01-04 NOTE — Discharge Summary (Addendum)
Physician Discharge Summary  Patient ID: [place] Hard MRN: [id number] DOB/AGE: [birth year] 57 y.o.  Admit date: 12/31/2020 Discharge date: 01/05/2021  Admission Diagnoses: Transient alteration of awareness  Discharge Diagnoses: Transient alteration of awareness  Discharged Condition: stable  Hospital Course: Mr. Jayne was admitted to epilepsy monitoring unit from 12/31/2020 to 01/04/2021.  During this time, patient was on continuous video EEG monitoring, antiseizure medications were stopped as seizure provocative measures. One event was recorded on on 01/01/2021 at around 10 PM. Per patient's wife, he was confused without concomitant EEG change and was most likely not an epileptic event.  One focal seizure was recorded on 01/04/2021 that are unknown.  The semiology of patient's focal seizure is different from the episodes patient's wife is describing.  Differentials for these episodes include frontal lobe seizures versus seizure disorder versus dementia related confusion (delusions of infidelity, audio, visual and gustatory hallucinations).  At this point, patient is already on Vimpat 200 mg twice daily and lamotrigine 200 mg twice daily.  Due to low suspicion for seizures, I would recommend adding Klonopin 0.5 mg at bedtime which can be increased to 9m if needed. If episodes persist, can consider adding low-dose Seroquel.  I would also recommend EEG and neuropsych evaluation for dementia.  Continue seizure precautions.  Follow-up with Dr. ADelice Lesch(primary neurologist).  Consults: None  Significant Diagnostic Studies:   EEG Description: The posterior dominant rhythm consists of8Hz  activity of moderate voltage (25-35 uV) seen predominantly in posterior head regions, symmetric and reactive to eye opening and eye closing. Sleep was characterized by vertex waves, sleep spindles (12 to 14 Hz), maximal frontocentral region. Hyperventilation and photic stimulation were not performed.   Event button was  pressed on 01/04/2021 at 1203.  Patient was sitting in bed suddenly had right gaze deviation followed by right head version and body turning to right followed by left arm jerking, gaze deviation to left and up and eventually generalized tonic-clonic seizures. Concomitant EEG initially showed sharp wave in left frontotemporal region. Rest of the EEG was difficult to interpret due to significant movement and electrode artifact.  Seizure ended at 1204 after which EEG showed generalized EEG suppression.  IMPRESSION: This study showed one seizure on 01/04/2021 at 1203 arising from left frontotemporal region as described above, lasting about 1 minute.   One event was recorded on 01/01/2021 at around 10 PM. Per patient's wife, he was confused without concomitant EEG change and was most likely not an epileptic event.  Treatments: Continue home medication, start Klonopin 0.5 mg daily at bedtime.  Discharge Exam: Blood pressure (!) 98/58, pulse 65, temperature 98.9 F (37.2 C), temperature source Axillary, resp. rate 20, height 5' 11"  (1.803 m), weight 85.6 kg, SpO2 97 %.  General: lying in bed,not in apparent distress CVS: pulse-normal rate and rhythm RS: breathing comfortably Extremities: normal  Neuro:AOx3,cranial nerves II through XII grossly intact, right upper and lower extremity 4/5, 5/5 in left upper and lower extremities  Disposition: Discharge disposition: 01-Home or Self Care   Allergies as of 01/05/2021      Reactions   Codeine Swelling   Codeine Rash, Other (See Comments)   Unknown reaction (patient says it was more serious than just a rash, but he can't remember what happened)      Medication List    TAKE these medications   acetaminophen 325 MG tablet Commonly known as: TYLENOL Take 1-2 tablets (325-650 mg total) by mouth every 4 (four) hours as needed for mild pain.  albuterol 108 (90 Base) MCG/ACT inhaler Commonly known as: ProAir HFA Inhale two puffs every 4-6 hours if  needed for cough or wheeze What changed:   how much to take  how to take this  when to take this  reasons to take this   atorvastatin 20 MG tablet Commonly known as: LIPITOR TAKE 1 TABLET BY MOUTH ONCE A DAY What changed: how much to take   Auryxia 1 GM 210 MG(Fe) tablet Generic drug: ferric citrate Take 210 mg by mouth every evening.   Basaglar KwikPen 100 UNIT/ML INJECT 6 UNITS UNDER THE SKIN ONCE A DAY (DISCARD 28 DAYS AFTER FIRST USE) What changed:   how much to take  how to take this  when to take this   Breo Ellipta 200-25 MCG/INH Aepb Generic drug: fluticasone furoate-vilanterol INHALE 1 PUFF BY MOUTH INTO THE LUNGS AT THE SAME TIME EVERY DAY   calcitRIOL 0.25 MCG capsule Commonly known as: ROCALTROL Take 0.25 mcg by mouth daily.   calcium acetate 667 MG capsule Commonly known as: PHOSLO TAKE 3 CAPSULES BY MOUTH THREE TIMES DAILY WITH MEALS AND 2 CAPSULES TWO TIMES DAILY WITH SNACKS What changed:   how much to take  how to take this  when to take this   camphor-menthol lotion Commonly known as: SARNA Apply 1 application topically every 8 (eight) hours as needed for itching.   Cetirizine HCl 10 MG Caps Take 1 capsule by mouth daily.   clonazePAM 0.5 MG tablet Commonly known as: KLONOPIN TAKE 2 TABLETS EVERY MONDAY, WEDNESDAY, AND FRIDAY WITH HEMODIALYSIS What changed:   how much to take  how to take this  when to take this  additional instructions   clonazePAM 0.5 MG tablet Commonly known as: KlonoPIN Take 1 tablet (0.5 mg total) by mouth at bedtime. Also take 15m in morning on Monday, Wednesday, Friday before dialysis What changed: You were already taking a medication with the same name, and this prescription was added. Make sure you understand how and when to take each.   D3-1000 PO Take 1 tablet by mouth See admin instructions. Take one tablet by mouth on Monday,Wednesday and Fridays   dexamethasone 4 MG tablet Commonly known as:  DECADRON Take 4 mg by mouth daily.   dextrose 50 % solution Dextrose 50%   freestyle lancets   FREESTYLE LITE test strip Generic drug: glucose blood   Glucagon Emergency 1 MG Kit See admin instructions.   HUMALOG KWIKPEN [place] Inject 2-4 Units into the skin 3 (three) times daily before meals. Sliding scale: 150-250 =2 units, 250 -350=3 units, 350 += 4 units   lacosamide 200 MG Tabs tablet Commonly known as: Vimpat TAKE 1 TABLET TWICE A DAY EXCEPT ON DIALYSIS DAYS M-W-F GIVE 1 TAB IN AM, 1 TAB AFTER DIALYSIS, 1 TAB IN PM. What changed:   how much to take  how to take this  when to take this  additional instructions   lamoTRIgine 200 MG tablet Commonly known as: LAMICTAL TAKE 1 TABLET BY MOUTH 2 TIMES DAILY What changed: Another medication with the same name was removed. Continue taking this medication, and follow the directions you see here.   midodrine 10 MG tablet Commonly known as: PROAMATINE TAKE 1 TABLET BY MOUTH 2 TIMES DAILY WITH MEALS What changed:   how much to take  how to take this  when to take this  additional instructions   multivitamin Tabs tablet Take 1 tablet by mouth at bedtime.   pantoprazole 40  MG tablet Commonly known as: PROTONIX TAKE 1 TABLET BY MOUTH ONCE DAILY   prednisoLONE acetate 1 % ophthalmic suspension Commonly known as: PRED FORTE Instill 1 drop Right Eye 3 times a day   SM Alcohol Prep 70 % Pads   SYSTANE OP Place 1 drop into the left eye daily as needed (For drops).   Unifine Pentips 31G X 8 MM Misc Generic drug: Insulin Pen Needle USE AS DIRECTED DAILY AFTER SUPPER   Unifine Pentips 32G X 4 MM Misc Generic drug: Insulin Pen Needle USE AS DIRECTED 4 TO 5 TIMES DAILY TO INJECT INSULIN       I have spent a total of  35  minutes with the patient reviewing hospital notes,  test results, labs and examining the patient as well as establishing an assessment and plan that was discussed personally with the patient his  wife and mother.  > 50% of time was spent in direct patient care.   Signed: Lora Havens 01/05/2021, 9:46 AM

## 2021-01-04 NOTE — Progress Notes (Signed)
Subjective: Patient reported some nausea overnight.  States he was feeling better this morning.  On dialysis.  Patient had seizure at 1203 today.  Now back to baseline.  ROS: negative except above  Examination  Vital signs in last 24 hours: Temp:  [98.1 F (36.7 C)-100.2 F (37.9 C)] 100.2 F (37.9 C) (04/29 0835) Pulse Rate:  [54-115] 115 (04/29 1240) Resp:  [10-30] 23 (04/29 1240) BP: (93-157)/(51-99) 93/74 (04/29 1205) SpO2:  [91 %-100 %] 92 % (04/29 1240) Weight:  [87.6 kg] 87.6 kg (04/29 0835)  General: lying in bed,not in apparent distress CVS: pulse-normal rate and rhythm RS: breathing comfortably Extremities: normal  Neuro:AOx3,cranial nerves II through XII grossly intact, right upper and lower extremity 4/5, 5/5 in left upper and lower extremities  Basic Metabolic Panel: Recent Labs  Lab 12/31/20 1306 01/02/21 0827 01/04/21 0612  NA 141 135 134*  K 3.7 4.3 4.4  CL 95* 92* 91*  CO2 34* 27 32  GLUCOSE 129* 105* 102*  BUN 24* 55* 42*  CREATININE 7.56* 11.77* 10.44*  CALCIUM 9.2 8.8* 8.7*  MG 2.1  --   --   PHOS 3.5 4.6 5.5*    CBC: Recent Labs  Lab 12/31/20 1306 01/02/21 0827 01/04/21 0612  WBC 11.5* 8.1 9.5  NEUTROABS 7.6  --   --   HGB 11.0* 10.7* 10.7*  HCT 29.5* RESULTS UNAVAILABLE DUE TO INTERFERING SUBSTANCE 28.6*  MCV 91.9 RESULTS UNAVAILABLE DUE TO INTERFERING SUBSTANCE 91.1  PLT 278 276 210     Coagulation Studies: No results for input(s): LABPROT, INR in the last 72 hours.  Imaging No new brain imaging or clinical  ASSESSMENT AND PLAN: 55 year old male with episodes of confusion, hallucination, delusions predominantly at night.  Epilepsy Transient alteration of awareness Delusions Hallucinations -On video EEG forcharacterization of spells -resumevimpat, lamotrigine, continueKlonopin -Patient seizure semiology is very different from the episodes wife described happening at night.  I suspect episodes overnight are related to  sleep disorder versus dementia.  We will start patient on Klonopin 0.5 mg daily at -Continue seizure precautions -As needed IV Ativan 2 mg clinical seizure-like activity  Diabetes -Continue home medications and SSI  Chronic kidney disease, on dialysis -Continuedialysis on MWF -Continue calcium and ferritin, Rena-Vite  Hyperlipidemia -Continue atorvastatin  GERD -Continue Protonix  Hypotension -Continue midodrine    I have spent a total of4mnuteswith the patient reviewing hospitalnotes, test results, labs and examining the patient as well as establishing an assessment and plan that was discussed personally with the patient at bedside and his wife on phone.>50% of time was spent in direct patient care.  PZeb ComfortEpilepsy [facility] For questions after 5pm please refer to AMION to reach the Neurologist on call

## 2021-01-04 NOTE — Procedures (Signed)
Patient Name:Autumn Roland MGN:[id number] Epilepsy Attending:Zalmen Wrightsman [name] Referring Physician/Provider:Dr Zeb Comfort Duration:01/03/2021 1259 to 4/29/20221259  Patient history:55 year old male with episodes of confusion, hallucination, delusions predominantly at night.EEG to evaluate for seizure  Level of alertness:Awake, asleep  AEDs during EEG study:LTG  Technical aspects: This EEG study was done with scalp electrodes positioned according to the 10-20 International system of electrode placement. Electrical activity was acquired at a sampling rate of 500Hz  and reviewed with a high frequency filter of 70Hz  and a low frequency filter of 1Hz . EEG data were recorded continuously and digitally stored.   Description: The posterior dominant rhythm consists of8Hz  activity of moderate voltage (25-35 uV) seen predominantly in posterior head regions, symmetric and reactive to eye opening and eye closing. Sleep was characterized by vertex waves, sleep spindles (12 to 14 Hz), maximal frontocentral region. Hyperventilation and photic stimulation were not performed.   Event button was pressed on 01/04/2021 at 1203.  Patient was sitting in bed suddenly had right gaze deviation followed by right head version and body turning to right followed by left arm jerking, gaze deviation to left and up and eventually generalized tonic-clonic seizures. Concomitant EEG initially showed sharp wave in left frontotemporal region. Rest of the EEG was difficult to interpret due to significant movement and electrode artifact.  Seizure ended at 1204 after which EEG showed generalized EEG suppression.  IMPRESSION: This study showed one seizure arising from left frontotemporal region as described above, lasting about 1 minute.    Garv Kuechle [name]

## 2021-01-05 LAB — GLUCOSE, CAPILLARY
Glucose-Capillary: 68 mg/dL — ABNORMAL LOW (ref 70–99)
Glucose-Capillary: 87 mg/dL (ref 70–99)
Glucose-Capillary: 95 mg/dL (ref 70–99)

## 2021-01-05 NOTE — Progress Notes (Signed)
[facility] Progress Note   Subjective:   Patient had a seizure during HD yesterday, recovered and was able to complete his treatment. Feeling well this AM, mild cough but no SOB, fever or chills. Denies CP, palpitations, dizziness and edema.   Objective Vitals:   01/04/21 1500 01/04/21 1937 01/04/21 2305 01/05/21 0305  BP: 132/73 (!) 157/77 111/65 (!) 98/58  Pulse: 78 74 65   Resp: 18 15 12 20   Temp:  98.9 F (37.2 C) 99.1 F (37.3 C) 98.9 F (37.2 C)  TempSrc:  Oral Oral Axillary  SpO2: 95% 98% 97%   Weight:      Height:       Physical Exam General: WDWN male, EEG ongoing. Alert and in NAD Heart: RRR, no murmur Lungs: CTA bilaterally without wheezing, rhonchi or rales Abdomen: Soft, non-tender, non-distended, +BS Extremities: No edema b/l lower extremities Dialysis Access: LUE AVF +bruit   Additional Objective Labs: Basic Metabolic Panel: Recent Labs  Lab 12/31/20 1306 01/02/21 0827 01/04/21 0612  NA 141 135 134*  K 3.7 4.3 4.4  CL 95* 92* 91*  CO2 34* 27 32  GLUCOSE 129* 105* 102*  BUN 24* 55* 42*  CREATININE 7.56* 11.77* 10.44*  CALCIUM 9.2 8.8* 8.7*  PHOS 3.5 4.6 5.5*   Liver Function Tests: Recent Labs  Lab 12/31/20 1306 01/02/21 0827 01/04/21 0612  AST 22  --   --   ALT 20  --   --   ALKPHOS 120  --   --   BILITOT 1.0  --   --   PROT 7.5  --   --   ALBUMIN 4.5 3.9 3.9   CBC: Recent Labs  Lab 12/31/20 1306 01/02/21 0827 01/04/21 0612  WBC 11.5* 8.1 9.5  NEUTROABS 7.6  --   --   HGB 11.0* 10.7* 10.7*  HCT 29.5* RESULTS UNAVAILABLE DUE TO INTERFERING SUBSTANCE 28.6*  MCV 91.9 RESULTS UNAVAILABLE DUE TO INTERFERING SUBSTANCE 91.1  PLT 278 276 210   Blood Culture    Component Value Date/Time   SDES BLOOD BLOOD LEFT HAND 10/04/2019 0947   SPECREQUEST  10/04/2019 0947    BOTTLES DRAWN AEROBIC AND ANAEROBIC Blood Culture results may not be optimal due to an inadequate volume of blood received in culture bottles   CULT   10/04/2019 0947    NO GROWTH 5 DAYS Performed at [facility], [place] [address], [place], [place] [address]    REPTSTATUS 10/09/2019 FINAL 10/04/2019 0947   CBG: Recent Labs  Lab 01/04/21 1210 01/04/21 1552 01/04/21 2114 01/05/21 0614 01/05/21 0633  GLUCAP 114* 121* 115* 68* 87    Medications:  . (feeding supplement) PROSource Plus  30 mL Oral BID BM  . atorvastatin  20 mg Oral QHS  . calcitRIOL  1.75 mcg Oral Q M,W,F-HD  . calcium acetate  667 mg Oral TID WC  . Chlorhexidine Gluconate Cloth  6 each Topical Q0600  . Chlorhexidine Gluconate Cloth  6 each Topical Q0600  . cinacalcet  60 mg Oral Q M,W,F-HD  . clonazePAM  0.5 mg Oral QHS  . clonazePAM  1 mg Oral Q M,W,F  . enoxaparin (LOVENOX) injection  30 mg Subcutaneous Q24H  . feeding supplement (NEPRO CARB STEADY)  237 mL Oral BID BM  . ferric citrate  210 mg Oral TID WC  . insulin aspart  2-4 Units Subcutaneous TID WC  . insulin glargine  6 Units Subcutaneous q morning  . lacosamide  200 mg  Oral BID  . lamoTRIgine  200 mg Oral BID  . midodrine  10 mg Oral BID   And  . midodrine  10 mg Oral Q M,W,F  . multivitamin  1 tablet Oral QHS  . pantoprazole  40 mg Oral Daily  . prednisoLONE acetate  1 drop Right Eye TID    Dialysis Orders: -Outpatient orders:4 hours, F180, 2K, 2Ca, BFR/DFR: 400/500  Assessment/Plan: ESRD: -Had seizure on HD Friday, neurology aware and on EEG -continue MWF schedule -no heparin  Volume/ hypotension: [id number]. Continue midodrine.No volume overload on exam. Continue UF to EDW as tolerated. If cough worsens, consider CXR to rule out aspiration event.  Anemia of Chronic Kidney Disease:Hemoglobin 10.7. Currently receivingmircera 44mg q4weeks, last dose 12/26/20, can wait for his next scheduled dose.   Secondary Hyperparathyroidism/Hyperphosphatemia:Calcium and phos controlled. Resuming calcitriol 1.728m qtreatment, sensipar 6051mtreatment  Vascular access:LUE AVF  +b/t  Hallucinations, delusions, transient alteration of awareness -neuro monitoring with continuous EEG -Had seizure on HD 01/04/21 -Meds per neurology  SamAnice PaganiniA-C 01/05/2021, 9:09 AM  [facility] Pager: [phone]

## 2021-01-05 NOTE — Procedures (Signed)
Patient Name:Isaiah [name] FPU:[id number] Epilepsy Attending:Sukhraj Esquivias [name] Referring Physician/Provider:Dr Zeb Comfort Duration:01/04/2021 9144QP 4/30/20221110  Patient history:55 year old male with episodes of confusion, hallucination, delusions predominantly at night.EEG to evaluate for seizure  Level of alertness:Awake, asleep  AEDs during EEG study:LTG, LCM, Clonazepam  Technical aspects: This EEG study was done with scalp electrodes positioned according to the 10-20 International system of electrode placement. Electrical activity was acquired at a sampling rate of 500Hz  and reviewed with a high frequency filter of 70Hz  and a low frequency filter of 1Hz . EEG data were recorded continuously and digitally stored.   Description: The posterior dominant rhythm consists of8Hz  activity of moderate voltage (25-35 uV) seen predominantly in posterior head regions, symmetric and reactive to eye opening and eye closing. Sleep was characterized by vertex waves, sleep spindles (12 to 14 Hz), maximal frontocentral region. Hyperventilation and photic stimulation were not performed.   IMPRESSION: This study is within normal limits. No seizures or epileptiform discharges were seen throughout the recording.  Isaiah [name] [name]

## 2021-01-05 NOTE — Progress Notes (Signed)
EEG removed, no skin break seen

## 2021-01-05 NOTE — Plan of Care (Signed)
  Problem: Education: Goal: Knowledge of General Education information will improve Description: Including pain rating scale, medication(s)/side effects and non-pharmacologic comfort measures Outcome: Progressing   Problem: Health Behavior/Discharge Planning: Goal: Ability to manage health-related needs will improve Outcome: Progressing   Problem: Clinical Measurements: Goal: Ability to maintain clinical measurements within normal limits will improve Outcome: Progressing Goal: Will remain free from infection Outcome: Progressing Goal: Diagnostic test results will improve Outcome: Progressing Goal: Respiratory complications will improve Outcome: Progressing Goal: Cardiovascular complication will be avoided Outcome: Progressing   Problem: Activity: Goal: Risk for activity intolerance will decrease Outcome: Progressing   Problem: Nutrition: Goal: Adequate nutrition will be maintained Outcome: Progressing   Problem: Coping: Goal: Level of anxiety will decrease Outcome: Progressing   Problem: Elimination: Goal: Will not experience complications related to bowel motility Outcome: Progressing Goal: Will not experience complications related to urinary retention Outcome: Progressing   Problem: Pain Managment: Goal: General experience of comfort will improve Outcome: Progressing   Problem: Safety: Goal: Ability to remain free from injury will improve Outcome: Progressing   Problem: Skin Integrity: Goal: Risk for impaired skin integrity will decrease Outcome: Progressing   Problem: Education: Goal: Expressions of having a comfortable level of knowledge regarding the disease process will increase Outcome: Progressing   Problem: Coping: Goal: Ability to adjust to condition or change in health will improve Outcome: Progressing Goal: Ability to identify appropriate support needs will improve Outcome: Progressing   Problem: Health Behavior/Discharge Planning: Goal:  Compliance with prescribed medication regimen will improve Outcome: Progressing   Problem: Medication: Goal: Risk for medication side effects will decrease Outcome: Progressing   Problem: Clinical Measurements: Goal: Complications related to the disease process, condition or treatment will be avoided or minimized Outcome: Progressing Goal: Diagnostic test results will improve Outcome: Progressing   Problem: Safety: Goal: Verbalization of understanding the information provided will improve Outcome: Progressing   Problem: Self-Concept: Goal: Level of anxiety will decrease Outcome: Progressing Goal: Ability to verbalize feelings about condition will improve Outcome: Progressing

## 2021-01-05 NOTE — Progress Notes (Signed)
Patient ready for discharge. IV and telemetry will be removed once EEG tech removed EEG. Patient has all belongings at bedside, family is at bedside and will we the patient's transportation home. Discharge instructions reviewed with patient and family, confirmed understanding through teachback. Patient received paper prescription.

## 2021-01-05 NOTE — Progress Notes (Signed)
Sent message to Dr Lorrin Goodell that SBP is 98 and he is asymptomatic but orders were to notify MD if < 100.  Pt is asleep and shows no signs of distress.

## 2021-01-05 NOTE — TOC Transition Note (Signed)
Transition of Care [facility]) - CM/SW Discharge Note   Patient Details  Name: Isaiah [name] [facility] MRN: [id number] Date of Birth: [birth year]  [facility] of [facility]) CM/SW Contact:  Pollie Friar, RN Phone Number: 01/05/2021, 10:06 AM   Clinical Narrative:    Patient is discharging home with self care. No needs per TOC.   Final next level of care: Home/Self Care Barriers to Discharge: No Barriers Identified   Patient Goals and CMS Choice        Discharge Placement                       Discharge Plan and Services                                     Social Determinants of Health (SDOH) Interventions     Readmission Risk Interventions Readmission Risk Prevention Plan 06/14/2019  [place] or Home Care Consult Complete  Social Work Consult for [place] Planning/Counseling Complete  Palliative Care Screening Not Applicable  Some recent data might be hidden

## 2021-01-06 ENCOUNTER — Telehealth: Payer: Self-pay | Admitting: Physician Assistant

## 2021-01-06 NOTE — Telephone Encounter (Signed)
Transition of care contact from inpatient facility  Date of discharge: 01/05/21 Date of contact: 01/06/21  Method: Phone Spoke to: Patient  Patient contacted to discuss transition of care from recent inpatient hospitalization. Patient was admitted to [name] from 12/31/20-01/05/21 with discharge diagnosis of transient alteration of awareness.  Medication changes were reviewed. Patient's wife confirms they have all of his medications. No further needs identified at this time.   Patient will follow up with his/her outpatient HD unit on: 01/07/21  Anice Paganini, PA-C 01/06/2021, 12:50 PM  [facility]

## 2021-01-07 ENCOUNTER — Other Ambulatory Visit (HOSPITAL_COMMUNITY): Payer: Self-pay

## 2021-01-07 ENCOUNTER — Other Ambulatory Visit: Payer: Self-pay | Admitting: *Deleted

## 2021-01-07 DIAGNOSIS — N186 End stage renal disease: Secondary | ICD-10-CM | POA: Diagnosis not present

## 2021-01-07 DIAGNOSIS — Z992 Dependence on renal dialysis: Secondary | ICD-10-CM | POA: Diagnosis not present

## 2021-01-07 DIAGNOSIS — N2581 Secondary hyperparathyroidism of renal origin: Secondary | ICD-10-CM | POA: Diagnosis not present

## 2021-01-07 LAB — HEPATITIS B E ANTIBODY: Hep B E Ab: NEGATIVE

## 2021-01-07 NOTE — Patient Outreach (Signed)
[place] [facility]) Care Management  01/07/2021  [place] Jan 13, 1966 [id number]   Transition of care telephone call  Referral received:01/07/21 Initial outreach:01/07/21 Insurance: [facility]   Initial unsuccessful telephone call to patient's preferred number in order to complete transition of care assessment; no answer, left HIPAA compliant voicemail message requesting return call.   Objective: Per the electronic medical record, [name] was hospitalized at [facility] 4/25-4/30/22, Seizure , episodes of confusion, hallucinations delusions, transient alteration of awareness, Epilepsy Monitoring Unit    . Comorbidities include: Diabetes, ESRD, Hemodialysis MWF, hypertension .He was discharged to home on 01/05/21 without the need for home health services or durable medical equipment per the discharge summary.   Plan: This RNCM will route unsuccessful outreach letter with [place] Management pamphlet and 24 hour Nurse Advice Line Magnet to [place] Management clinical pool to be mailed to patient's home address. This RNCM will attempt another outreach within 4 business days. Will communicate via inbasket with Barb Merino RN  assigned embedded CM regarding coordination and transition to embedded practice as prior to admission as appropriate.   Joylene Draft, RN, BSN  [place] Management Coordinator  [phone]- Mobile [phone]- Toll Free Main Office

## 2021-01-08 ENCOUNTER — Telehealth: Payer: Self-pay

## 2021-01-08 ENCOUNTER — Telehealth: Payer: Self-pay | Admitting: *Deleted

## 2021-01-08 ENCOUNTER — Other Ambulatory Visit (HOSPITAL_COMMUNITY): Payer: Self-pay

## 2021-01-08 MED ORDER — CLONAZEPAM 0.5 MG PO TABS
0.5000 mg | ORAL_TABLET | ORAL | 0 refills | Status: DC
  Filled 2021-01-08: qty 60, 42d supply, fill #0
  Filled 2021-02-06: qty 60, 21d supply, fill #0

## 2021-01-08 NOTE — Telephone Encounter (Signed)
[name] wanted to know if it was ok for the pt to travel to [place] and she was told that Dr. Baird Cancer said she doesn't see an issue with it but for her to also check with the pt's neurologist and oncologist.

## 2021-01-08 NOTE — Telephone Encounter (Signed)
Patient's wife called. Wants to know if ok for patient to fly to [name] for grandmother's [address] birthday? Dr. Alen Blew informed of question. Contacted wife with Dr. Hazeline Junker response: No objections from my point of view Left voice mail with Dr. Hazeline Junker response and asked to have her call office if further questions.

## 2021-01-09 ENCOUNTER — Other Ambulatory Visit (HOSPITAL_COMMUNITY): Payer: Self-pay

## 2021-01-09 DIAGNOSIS — R4182 Altered mental status, unspecified: Secondary | ICD-10-CM | POA: Diagnosis not present

## 2021-01-09 DIAGNOSIS — Z992 Dependence on renal dialysis: Secondary | ICD-10-CM | POA: Diagnosis not present

## 2021-01-09 DIAGNOSIS — G40911 Epilepsy, unspecified, intractable, with status epilepticus: Secondary | ICD-10-CM | POA: Diagnosis not present

## 2021-01-09 DIAGNOSIS — I959 Hypotension, unspecified: Secondary | ICD-10-CM | POA: Diagnosis not present

## 2021-01-09 DIAGNOSIS — N2581 Secondary hyperparathyroidism of renal origin: Secondary | ICD-10-CM | POA: Diagnosis not present

## 2021-01-09 DIAGNOSIS — N186 End stage renal disease: Secondary | ICD-10-CM | POA: Diagnosis not present

## 2021-01-09 MED FILL — Midodrine HCl Tab 10 MG: ORAL | 30 days supply | Qty: 60 | Fill #0 | Status: AC

## 2021-01-10 ENCOUNTER — Other Ambulatory Visit (HOSPITAL_COMMUNITY): Payer: Self-pay

## 2021-01-10 ENCOUNTER — Other Ambulatory Visit: Payer: Self-pay | Admitting: *Deleted

## 2021-01-10 ENCOUNTER — Telehealth: Payer: Self-pay | Admitting: Neurology

## 2021-01-10 ENCOUNTER — Encounter: Payer: Self-pay | Admitting: *Deleted

## 2021-01-10 MED FILL — Lamotrigine Tab 200 MG: ORAL | 3 days supply | Qty: 6 | Fill #1 | Status: AC

## 2021-01-10 NOTE — Patient Outreach (Signed)
[facility]) Care Management  01/10/2021  [place] Mar 21, 1966 [id number]   Transition of care call/case closure   Referral received:01/07/21 Initial outreach:01/07/21 Insurance: [place]   #2 Outreach call  Subjective: 2nd outreach  successful telephone call to patient's preferred number in order to complete transition of care assessment; spoke with patient wife Isaiah [name], Designated party release, 2 HIPAA identifiers verified. Explained purpose of call and completed transition of care assessment.  Wife reports patient doing okay,, she denies any new or worsening neurological symptoms. denies bowel or bladder problems.  Spouse is assisting with his recovery, she assist with patient medication organization.  Patient is attending Hemodialysis treatments MWF at scheduled.  Wife declines need for additional education or resources information.   Reviewed accessing the following [place] Benefits : Wife states patient is enrolled in one of the [place] chronic disease management programs for disease management of chronic conditions, including  Diabetes, hypertension.  Wife states that she does  have the [name] indemnity plan and has filed a claim.  Patient uses  a [facility] outpatient pharmacy at [place].    Objective:  [name] was hospitalized at [name] 4/25-4/30/22, Seizure , episodes of confusion, hallucinations delusions, transient alteration of awareness, Epilepsy Monitoring Unit    . Comorbidities include: Diabetes, ESRD, Hemodialysis MWF, hypertension .He was discharged to home on 01/05/21 without the need for home health services or durable medical equipment per the discharge summary.   Assessment:  Patient voices good understanding of all discharge instructions.  See transition of care flowsheet for assessment details.   Plan:  Reviewed [name] discharge diagnosis of Epilepsy monitoring unit, Seizures    and discharge treatment plan using [name] discharge instructions, assessing medication adherence, reviewing problems requiring provider notification, and discussing the importance of follow up with surgeon, primary care provider and/or specialists as directed.  Reviewed [employer] healthy lifestyle program information to receive discounted premium for  2023   Step 1: Get  your annual physical  Step 2: Complete your health assessment  Step 3:Identify your current health status and complete the corresponding action step between September 08, 2020 and May 09, 2021.    Using [place] website, verified that patient is an active participate in [place]'s [employer] Management chronic disease management program.    No ongoing care management needs identified so will close case to [place] Management services.   Joylene Draft, RN, BSN  [place] Management Coordinator  [phone]- Mobile [phone]- Toll Free Main Office

## 2021-01-10 NOTE — Telephone Encounter (Signed)
Paitent's wife called and cancelled 01/15/21 with Dr. Delice Lesch. The patient is taking a trip to [place] instead.   She said she has some questions for the nurse about his medication in the meantime and needs a nurse to call her back.

## 2021-01-10 NOTE — Telephone Encounter (Signed)
Pt is going to [facility] he will be gone for 3 weeks and he will be one day short on his lamictal pt wife is asking will he be ok missing that dose? Pt wife advised to ask the pharmacy about getting early refill for vacation or if they can pay for 2 day supply to make sure he has enough, she stated she would call the pharmacy to see.

## 2021-01-11 ENCOUNTER — Encounter: Payer: Self-pay | Admitting: Internal Medicine

## 2021-01-11 DIAGNOSIS — Z992 Dependence on renal dialysis: Secondary | ICD-10-CM | POA: Diagnosis not present

## 2021-01-11 DIAGNOSIS — N186 End stage renal disease: Secondary | ICD-10-CM | POA: Diagnosis not present

## 2021-01-11 DIAGNOSIS — N2581 Secondary hyperparathyroidism of renal origin: Secondary | ICD-10-CM | POA: Diagnosis not present

## 2021-01-11 NOTE — Telephone Encounter (Signed)
Spoke with pt wife she was able to get 5 pills so that he will not run out of medication while on his trip, he will be back after June 3rd would like to be added to wait list

## 2021-01-11 NOTE — Telephone Encounter (Signed)
Great, thanks

## 2021-01-14 DIAGNOSIS — D509 Iron deficiency anemia, unspecified: Secondary | ICD-10-CM | POA: Diagnosis not present

## 2021-01-14 DIAGNOSIS — Z992 Dependence on renal dialysis: Secondary | ICD-10-CM | POA: Diagnosis not present

## 2021-01-14 DIAGNOSIS — N186 End stage renal disease: Secondary | ICD-10-CM | POA: Diagnosis not present

## 2021-01-14 DIAGNOSIS — E1065 Type 1 diabetes mellitus with hyperglycemia: Secondary | ICD-10-CM | POA: Diagnosis not present

## 2021-01-14 DIAGNOSIS — N2581 Secondary hyperparathyroidism of renal origin: Secondary | ICD-10-CM | POA: Diagnosis not present

## 2021-01-15 ENCOUNTER — Ambulatory Visit: Payer: 59 | Admitting: Neurology

## 2021-01-15 ENCOUNTER — Ambulatory Visit: Payer: 59 | Admitting: Physician Assistant

## 2021-01-16 DIAGNOSIS — Z992 Dependence on renal dialysis: Secondary | ICD-10-CM | POA: Diagnosis not present

## 2021-01-16 DIAGNOSIS — D509 Iron deficiency anemia, unspecified: Secondary | ICD-10-CM | POA: Diagnosis not present

## 2021-01-16 DIAGNOSIS — N2581 Secondary hyperparathyroidism of renal origin: Secondary | ICD-10-CM | POA: Diagnosis not present

## 2021-01-16 DIAGNOSIS — N186 End stage renal disease: Secondary | ICD-10-CM | POA: Diagnosis not present

## 2021-01-18 DIAGNOSIS — N186 End stage renal disease: Secondary | ICD-10-CM | POA: Diagnosis not present

## 2021-01-18 DIAGNOSIS — D509 Iron deficiency anemia, unspecified: Secondary | ICD-10-CM | POA: Diagnosis not present

## 2021-01-18 DIAGNOSIS — N2581 Secondary hyperparathyroidism of renal origin: Secondary | ICD-10-CM | POA: Diagnosis not present

## 2021-01-18 DIAGNOSIS — Z992 Dependence on renal dialysis: Secondary | ICD-10-CM | POA: Diagnosis not present

## 2021-01-19 ENCOUNTER — Other Ambulatory Visit (HOSPITAL_COMMUNITY): Payer: Self-pay

## 2021-01-21 ENCOUNTER — Telehealth: Payer: Medicare Other

## 2021-01-21 DIAGNOSIS — N2581 Secondary hyperparathyroidism of renal origin: Secondary | ICD-10-CM | POA: Diagnosis not present

## 2021-01-21 DIAGNOSIS — Z992 Dependence on renal dialysis: Secondary | ICD-10-CM | POA: Diagnosis not present

## 2021-01-21 DIAGNOSIS — N186 End stage renal disease: Secondary | ICD-10-CM | POA: Diagnosis not present

## 2021-01-21 DIAGNOSIS — D509 Iron deficiency anemia, unspecified: Secondary | ICD-10-CM | POA: Diagnosis not present

## 2021-01-22 ENCOUNTER — Telehealth: Payer: Self-pay

## 2021-01-22 ENCOUNTER — Other Ambulatory Visit (HOSPITAL_COMMUNITY): Payer: Self-pay

## 2021-01-22 ENCOUNTER — Telehealth: Payer: Medicare Other

## 2021-01-22 NOTE — Telephone Encounter (Signed)
  Care Management   Follow Up Note   01/22/2021 Name: Isaiah [name] [facility] MRN: [id number] DOB: 18-Jan-1966   Referred by: [name], MD Reason for referral : Chronic Care Management (RNCM Follow up Attempt )  An unsuccessful telephone outreach was attempted today.I spoke with Isaiah [name] briefly today, unfortunately she is not available to speak with me regarding [name] and would prefer a call back tomorrow afternoon. The patient was referred to the case management team for assistance with care management and care coordination.   Follow Up Plan: Telephone follow up appointment with care management team member scheduled for: 01/23/21  Barb Merino, RN, BSN, CCM Care Management Coordinator [place] Management/[facility]  Direct Phone: [phone]

## 2021-01-23 ENCOUNTER — Ambulatory Visit (INDEPENDENT_AMBULATORY_CARE_PROVIDER_SITE_OTHER): Payer: Medicare Other

## 2021-01-23 ENCOUNTER — Telehealth: Payer: Medicare Other

## 2021-01-23 DIAGNOSIS — N186 End stage renal disease: Secondary | ICD-10-CM

## 2021-01-23 DIAGNOSIS — C9000 Multiple myeloma not having achieved remission: Secondary | ICD-10-CM

## 2021-01-23 DIAGNOSIS — D509 Iron deficiency anemia, unspecified: Secondary | ICD-10-CM | POA: Diagnosis not present

## 2021-01-23 DIAGNOSIS — E1122 Type 2 diabetes mellitus with diabetic chronic kidney disease: Secondary | ICD-10-CM | POA: Diagnosis not present

## 2021-01-23 DIAGNOSIS — Z992 Dependence on renal dialysis: Secondary | ICD-10-CM | POA: Diagnosis not present

## 2021-01-23 DIAGNOSIS — N2581 Secondary hyperparathyroidism of renal origin: Secondary | ICD-10-CM | POA: Diagnosis not present

## 2021-01-25 DIAGNOSIS — N2581 Secondary hyperparathyroidism of renal origin: Secondary | ICD-10-CM | POA: Diagnosis not present

## 2021-01-25 DIAGNOSIS — N186 End stage renal disease: Secondary | ICD-10-CM | POA: Diagnosis not present

## 2021-01-25 DIAGNOSIS — D509 Iron deficiency anemia, unspecified: Secondary | ICD-10-CM | POA: Diagnosis not present

## 2021-01-25 DIAGNOSIS — Z992 Dependence on renal dialysis: Secondary | ICD-10-CM | POA: Diagnosis not present

## 2021-01-26 DIAGNOSIS — Z992 Dependence on renal dialysis: Secondary | ICD-10-CM | POA: Insufficient documentation

## 2021-01-26 DIAGNOSIS — R442 Other hallucinations: Secondary | ICD-10-CM | POA: Insufficient documentation

## 2021-01-28 ENCOUNTER — Other Ambulatory Visit (HOSPITAL_COMMUNITY): Payer: Self-pay

## 2021-01-28 ENCOUNTER — Other Ambulatory Visit: Payer: Self-pay | Admitting: Neurology

## 2021-01-28 DIAGNOSIS — Z992 Dependence on renal dialysis: Secondary | ICD-10-CM | POA: Diagnosis not present

## 2021-01-28 DIAGNOSIS — N186 End stage renal disease: Secondary | ICD-10-CM | POA: Diagnosis not present

## 2021-01-28 DIAGNOSIS — D509 Iron deficiency anemia, unspecified: Secondary | ICD-10-CM | POA: Diagnosis not present

## 2021-01-28 DIAGNOSIS — N2581 Secondary hyperparathyroidism of renal origin: Secondary | ICD-10-CM | POA: Diagnosis not present

## 2021-01-29 ENCOUNTER — Other Ambulatory Visit (HOSPITAL_COMMUNITY): Payer: Self-pay

## 2021-01-29 ENCOUNTER — Other Ambulatory Visit: Payer: Self-pay

## 2021-01-29 DIAGNOSIS — R079 Chest pain, unspecified: Secondary | ICD-10-CM | POA: Diagnosis not present

## 2021-01-29 DIAGNOSIS — K449 Diaphragmatic hernia without obstruction or gangrene: Secondary | ICD-10-CM | POA: Diagnosis not present

## 2021-01-29 DIAGNOSIS — E1122 Type 2 diabetes mellitus with diabetic chronic kidney disease: Secondary | ICD-10-CM | POA: Diagnosis not present

## 2021-01-29 DIAGNOSIS — N2581 Secondary hyperparathyroidism of renal origin: Secondary | ICD-10-CM | POA: Diagnosis not present

## 2021-01-29 DIAGNOSIS — Z794 Long term (current) use of insulin: Secondary | ICD-10-CM | POA: Diagnosis not present

## 2021-01-29 DIAGNOSIS — R918 Other nonspecific abnormal finding of lung field: Secondary | ICD-10-CM | POA: Diagnosis not present

## 2021-01-29 DIAGNOSIS — Z992 Dependence on renal dialysis: Secondary | ICD-10-CM | POA: Diagnosis not present

## 2021-01-29 DIAGNOSIS — Z8673 Personal history of transient ischemic attack (TIA), and cerebral infarction without residual deficits: Secondary | ICD-10-CM | POA: Diagnosis not present

## 2021-01-29 DIAGNOSIS — R531 Weakness: Secondary | ICD-10-CM | POA: Diagnosis not present

## 2021-01-29 DIAGNOSIS — D631 Anemia in chronic kidney disease: Secondary | ICD-10-CM | POA: Diagnosis not present

## 2021-01-29 DIAGNOSIS — M545 Low back pain, unspecified: Secondary | ICD-10-CM | POA: Diagnosis not present

## 2021-01-29 DIAGNOSIS — N186 End stage renal disease: Secondary | ICD-10-CM | POA: Diagnosis not present

## 2021-01-29 DIAGNOSIS — M546 Pain in thoracic spine: Secondary | ICD-10-CM | POA: Diagnosis not present

## 2021-01-29 DIAGNOSIS — I120 Hypertensive chronic kidney disease with stage 5 chronic kidney disease or end stage renal disease: Secondary | ICD-10-CM | POA: Diagnosis not present

## 2021-01-29 DIAGNOSIS — R519 Headache, unspecified: Secondary | ICD-10-CM | POA: Diagnosis not present

## 2021-01-29 MED ORDER — LAMOTRIGINE 200 MG PO TABS
ORAL_TABLET | Freq: Two times a day (BID) | ORAL | 3 refills | Status: DC
  Filled 2021-01-29 – 2021-02-06 (×2): qty 60, 30d supply, fill #0
  Filled 2021-03-01: qty 60, 30d supply, fill #1

## 2021-01-30 DIAGNOSIS — M545 Low back pain, unspecified: Secondary | ICD-10-CM | POA: Diagnosis not present

## 2021-01-30 DIAGNOSIS — R531 Weakness: Secondary | ICD-10-CM | POA: Diagnosis not present

## 2021-01-30 DIAGNOSIS — Z8673 Personal history of transient ischemic attack (TIA), and cerebral infarction without residual deficits: Secondary | ICD-10-CM | POA: Diagnosis not present

## 2021-01-30 DIAGNOSIS — E559 Vitamin D deficiency, unspecified: Secondary | ICD-10-CM | POA: Diagnosis not present

## 2021-01-30 DIAGNOSIS — N186 End stage renal disease: Secondary | ICD-10-CM | POA: Diagnosis not present

## 2021-01-30 DIAGNOSIS — E1122 Type 2 diabetes mellitus with diabetic chronic kidney disease: Secondary | ICD-10-CM | POA: Diagnosis not present

## 2021-01-30 DIAGNOSIS — M5459 Other low back pain: Secondary | ICD-10-CM | POA: Diagnosis not present

## 2021-01-30 DIAGNOSIS — I120 Hypertensive chronic kidney disease with stage 5 chronic kidney disease or end stage renal disease: Secondary | ICD-10-CM | POA: Diagnosis not present

## 2021-01-30 DIAGNOSIS — I10 Essential (primary) hypertension: Secondary | ICD-10-CM | POA: Diagnosis not present

## 2021-01-30 DIAGNOSIS — Z992 Dependence on renal dialysis: Secondary | ICD-10-CM | POA: Diagnosis not present

## 2021-01-30 DIAGNOSIS — D631 Anemia in chronic kidney disease: Secondary | ICD-10-CM | POA: Diagnosis not present

## 2021-01-30 DIAGNOSIS — N2581 Secondary hyperparathyroidism of renal origin: Secondary | ICD-10-CM | POA: Diagnosis not present

## 2021-02-01 DIAGNOSIS — N186 End stage renal disease: Secondary | ICD-10-CM | POA: Diagnosis not present

## 2021-02-01 DIAGNOSIS — N2581 Secondary hyperparathyroidism of renal origin: Secondary | ICD-10-CM | POA: Diagnosis not present

## 2021-02-01 DIAGNOSIS — D509 Iron deficiency anemia, unspecified: Secondary | ICD-10-CM | POA: Diagnosis not present

## 2021-02-01 DIAGNOSIS — Z992 Dependence on renal dialysis: Secondary | ICD-10-CM | POA: Diagnosis not present

## 2021-02-04 DIAGNOSIS — N186 End stage renal disease: Secondary | ICD-10-CM | POA: Diagnosis not present

## 2021-02-04 DIAGNOSIS — Z992 Dependence on renal dialysis: Secondary | ICD-10-CM | POA: Diagnosis not present

## 2021-02-04 DIAGNOSIS — N2581 Secondary hyperparathyroidism of renal origin: Secondary | ICD-10-CM | POA: Diagnosis not present

## 2021-02-04 DIAGNOSIS — D509 Iron deficiency anemia, unspecified: Secondary | ICD-10-CM | POA: Diagnosis not present

## 2021-02-05 ENCOUNTER — Other Ambulatory Visit (HOSPITAL_COMMUNITY): Payer: Self-pay

## 2021-02-06 ENCOUNTER — Other Ambulatory Visit (HOSPITAL_COMMUNITY): Payer: Self-pay

## 2021-02-06 DIAGNOSIS — D631 Anemia in chronic kidney disease: Secondary | ICD-10-CM | POA: Diagnosis not present

## 2021-02-06 DIAGNOSIS — N186 End stage renal disease: Secondary | ICD-10-CM | POA: Diagnosis not present

## 2021-02-06 DIAGNOSIS — Z992 Dependence on renal dialysis: Secondary | ICD-10-CM | POA: Diagnosis not present

## 2021-02-06 DIAGNOSIS — N2581 Secondary hyperparathyroidism of renal origin: Secondary | ICD-10-CM | POA: Diagnosis not present

## 2021-02-06 DIAGNOSIS — E1122 Type 2 diabetes mellitus with diabetic chronic kidney disease: Secondary | ICD-10-CM | POA: Diagnosis not present

## 2021-02-07 ENCOUNTER — Other Ambulatory Visit: Payer: Self-pay | Admitting: Internal Medicine

## 2021-02-07 ENCOUNTER — Ambulatory Visit: Payer: Medicare Other | Admitting: Physical Medicine & Rehabilitation

## 2021-02-07 MED FILL — Atorvastatin Calcium Tab 20 MG (Base Equivalent): ORAL | 90 days supply | Qty: 90 | Fill #0 | Status: AC

## 2021-02-08 ENCOUNTER — Other Ambulatory Visit (HOSPITAL_COMMUNITY): Payer: Self-pay

## 2021-02-08 DIAGNOSIS — D631 Anemia in chronic kidney disease: Secondary | ICD-10-CM | POA: Diagnosis not present

## 2021-02-08 DIAGNOSIS — N186 End stage renal disease: Secondary | ICD-10-CM | POA: Diagnosis not present

## 2021-02-08 DIAGNOSIS — E1122 Type 2 diabetes mellitus with diabetic chronic kidney disease: Secondary | ICD-10-CM | POA: Diagnosis not present

## 2021-02-08 DIAGNOSIS — Z992 Dependence on renal dialysis: Secondary | ICD-10-CM | POA: Diagnosis not present

## 2021-02-08 DIAGNOSIS — N2581 Secondary hyperparathyroidism of renal origin: Secondary | ICD-10-CM | POA: Diagnosis not present

## 2021-02-08 NOTE — Chronic Care Management (AMB) (Signed)
Chronic Care Management   CCM RN Visit Note  01/23/2021 Name: Isaiah [name] [facility] MRN: [id number] DOB: [birth year]  Subjective: Isaiah [name] is a 55 y.o. year old male who is a primary care patient of [name], MD. The care management team was consulted for assistance with disease management and care coordination needs.    Engaged with patient by telephone for follow up visit in response to provider referral for case management and/or care coordination services.   Consent to Services:  The patient was given information about Chronic Care Management services, agreed to services, and gave verbal consent prior to initiation of services.  Please see initial visit note for detailed documentation.   Patient agreed to services and verbal consent obtained.   Assessment: Review of patient past medical history, allergies, medications, health status, including review of consultants reports, laboratory and other test data, was performed as part of comprehensive evaluation and provision of chronic care management services.   SDOH (Social Determinants of Health) assessments and interventions performed: Yes, no acute challenges identified   CCM Care Plan  Allergies  Allergen Reactions  . Codeine Swelling  . Codeine Rash and Other (See Comments)    Unknown reaction (patient says it was more serious than just a rash, but he can't remember what happened)     Outpatient Encounter Medications as of 01/23/2021  Medication Sig Note  . acetaminophen (TYLENOL) 325 MG tablet Take 1-2 tablets (325-650 mg total) by mouth every 4 (four) hours as needed for mild pain.   [name] albuterol (PROAIR HFA) 108 (90 Base) MCG/ACT inhaler Inhale two puffs every 4-6 hours if needed for cough or wheeze (Patient taking differently: Inhale 2 puffs into the lungs every 4 (four) hours as needed for wheezing or shortness of breath. Inhale two puffs every 4-6 hours if needed for cough or wheeze)   . Alcohol Swabs (SM ALCOHOL PREP)  70 % PADS    . atorvastatin (LIPITOR) 20 MG tablet TAKE 1 TABLET BY MOUTH ONCE A DAY (Patient taking differently: Take 20 mg by mouth daily.)   . AURYXIA 1 GM 210 MG(Fe) tablet Take 210 mg by mouth every evening.   . calcitRIOL (ROCALTROL) 0.25 MCG capsule Take 0.25 mcg by mouth daily.   . calcium acetate (PHOSLO) 667 MG capsule TAKE 3 CAPSULES BY MOUTH THREE TIMES DAILY WITH MEALS AND 2 CAPSULES TWO TIMES DAILY WITH SNACKS (Patient taking differently: Take 667 mg by mouth 3 (three) times daily with meals.)   . camphor-menthol (SARNA) lotion Apply 1 application topically every 8 (eight) hours as needed for itching.   . Cetirizine HCl 10 MG CAPS Take 1 capsule by mouth daily.    . Cholecalciferol (D3-1000 PO) Take 1 tablet by mouth See admin instructions. Take one tablet by mouth on Monday,Wednesday and Fridays 12/31/2020: M,w,F  . clonazePAM (KLONOPIN) 0.5 MG tablet TAKE 2 TABLETS EVERY MONDAY, WEDNESDAY, AND FRIDAY WITH HEMODIALYSIS   . clonazePAM (KLONOPIN) 0.5 MG tablet Take 1 tablet (0.5 mg total) by mouth at bedtime. Also take 61m in morning on Monday, Wednesday, Friday before dialysis   . clonazePAM (KLONOPIN) 0.5 MG tablet Take 2 tablets (1 mg total) by mouth in morning on Mon, Wed, and Friday before dialysis and take 1 tablet at bedtime   . dexamethasone (DECADRON) 4 MG tablet Take 4 mg by mouth daily.   .Marland Kitchendextrose 50 % solution Dextrose 50%   . fluticasone furoate-vilanterol (BREO ELLIPTA) 200-25 MCG/INH AEPB INHALE 1 PUFF BY MOUTH INTO  THE LUNGS AT THE SAME TIME EVERY DAY   . FREESTYLE LITE test strip    . Glucagon, rDNA, (GLUCAGON EMERGENCY) 1 MG KIT See admin instructions.   . Insulin Glargine (BASAGLAR KWIKPEN) 100 UNIT/ML INJECT 6 UNITS UNDER THE SKIN ONCE A DAY (DISCARD 28 DAYS AFTER FIRST USE) (Patient taking differently: Inject 6 Units into the skin daily.)   . Insulin Lispro (HUMALOG KWIKPEN [place]) Inject 2-4 Units into the skin 3 (three) times daily before meals. Sliding scale:  150-250 =2 units, 250 -350=3 units, 350 += 4 units   . Insulin Pen Needle 32G X 4 MM MISC USE AS DIRECTED 4 TO 5 TIMES DAILY TO INJECT INSULIN   . lacosamide (VIMPAT) 200 MG TABS tablet TAKE 1 TABLET TWICE A DAY EXCEPT ON DIALYSIS DAYS M-W-F GIVE 1 TAB IN AM, 1 TAB AFTER DIALYSIS, 1 TAB IN PM.   . Lancets (FREESTYLE) lancets    . midodrine (PROAMATINE) 10 MG tablet TAKE 1 TABLET BY MOUTH 2 TIMES DAILY WITH MEALS (Patient taking differently: Take 10-20 mg by mouth See admin instructions. Take one tablet by mouth daily except on dialysis days take one extra dose)   . multivitamin (RENA-VIT) TABS tablet Take 1 tablet by mouth at bedtime.   . pantoprazole (PROTONIX) 40 MG tablet TAKE 1 TABLET BY MOUTH ONCE DAILY   . Polyethyl Glycol-Propyl Glycol (SYSTANE OP) Place 1 drop into the left eye daily as needed (For drops).   . prednisoLONE acetate (PRED FORTE) 1 % ophthalmic suspension Instill 1 drop Right Eye 3 times a day   . UNIFINE PENTIPS 31G X 8 MM MISC USE AS DIRECTED DAILY AFTER SUPPER   . [DISCONTINUED] lamoTRIgine (LAMICTAL) 200 MG tablet TAKE 1 TABLET BY MOUTH 2 TIMES DAILY    No facility-administered encounter medications on file as of 01/23/2021.    Patient Active Problem List   Diagnosis Date Noted  . Olfactory hallucinations 01/26/2021  . Dependence on renal dialysis ([place]) 01/26/2021  . Colitis 03/15/2020  . AMS (altered mental status) 10/05/2019  . Acute metabolic encephalopathy 01/74/9449  . Headache, unspecified 09/28/2019  . Abnormality of gait 09/13/2019  . Diabetes mellitus with end-stage renal disease ([place]) 08/07/2019  . Benign hypertensive kidney disease with chronic kidney disease 08/07/2019  . Hypothyroidism, unspecified 07/15/2019  . Spastic hemiparesis ([place]) 07/12/2019  . Dysphagia, oropharyngeal phase 07/05/2019  . Allergy, unspecified, initial encounter 06/27/2019  . PAF (paroxysmal atrial fibrillation) ([place])   . Bacteremia   . Labile blood pressure   . Labile  blood glucose   . Controlled type 2 diabetes mellitus with hyperglycemia, without long-term current use of insulin ([place])   . ESRD on dialysis ([place])   . Right sided weakness   . SDH (subdural hematoma) ([place])   . Chronic intracranial subdural hematoma (HCC) 06/09/2019  . Multiple myeloma not having achieved remission ([place]) 06/09/2019  . ESRD on hemodialysis ([place]) 06/09/2019  . Essential hypertension 06/09/2019  . DM type 2 (diabetes mellitus, type 2) ([place]) 06/09/2019  . Seizure disorder ([place]) 06/09/2019  . Aspiration pneumonia ([place]) 06/09/2019  . History of bacteremia 06/09/2019  . Atrial fibrillation, chronic ([place]) 06/09/2019  . Bilateral kidney stones 05/31/2019  . Proteus infection 05/28/2019  . History of restrictive pulmonary disease 04/07/2019  . Multiple myeloma ([place]) 05/17/2018  . Goals of care, counseling/discussion 05/17/2018  . Partial idiopathic epilepsy with seizures of localized onset, intractable, without status epilepticus ([place]) 11/02/2017  . Iron deficiency anemia, unspecified 01/22/2017  . COPD (chronic  obstructive pulmonary disease) ([place]) 11/11/2016  . CVA (cerebral vascular accident) ([place]) 11/11/2016  . GERD (gastroesophageal reflux disease) 11/11/2016  . GI bleed 11/11/2016  . 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  . Obesity (BMI 30-39.9) 11/11/2016  . Pulmonary hypertension ([place]) 11/11/2016  . Patent foramen ovale 11/11/2016  . Hyperparathyroidism, secondary renal ([place]) 11/11/2016  . VTE (venous thromboembolism) 11/11/2016  . Hypotension 11/11/2016  . Systemic hypertension 11/11/2016  . Seizure ([place]) 11/11/2016  . Other seizures ([place]) 07/25/2016  . Encounter for removal of sutures 05/27/2016  . Ingrown nail 03/03/2016  . Onychomycosis due to dermatophyte 03/03/2016  . Other acquired hammer toe 03/03/2016  . Hemiparesis affecting right side as late effect of cerebrovascular accident ([place]) 02/28/2016  . Aphasia complicating stroke 65/78/4696  .  Seizures ([place]) 01/08/2016  . History of ischemic left MCA stroke 01/08/2016  . Right sided weakness 01/08/2016  . Atrial fibrillation ([place]) 01/08/2016  . Leukocytosis 01/08/2016  . Benign essential HTN 01/08/2016  . Anemia of chronic disease 01/08/2016  . Controlled type 2 diabetes mellitus with diabetic nephropathy, without long-term current use of insulin ([place]) 01/08/2016  . History of DVT (deep vein thrombosis) 01/08/2016  . 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 Hypocalcemia 12/26/2015  . Long term (current) use of anticoagulants 11/07/2015  . Aftercare including intermittent dialysis ([place]) 11/02/2015  . Other specified coagulation defects ([place]) 11/02/2015  . Complication of vascular dialysis catheter 11/02/2015  . Diarrhea, unspecified 11/02/2015  . Fever, unspecified 11/02/2015  . Pruritus, unspecified 11/02/2015  . Shortness of breath 11/02/2015  . ESRD (end stage renal disease) on dialysis ([place])     Conditions to be addressed/monitored:DM with ESRD, Multiple Myeoloma  Care Plan : Cancer Treatment Phase (Adult)  Updates made by Lynne Logan, RN since 02/08/2021 12:00 AM    Problem: High Risk for Infection   Priority: Medium    Long-Range Goal: Infection Prevented or Managed   Start Date: 10/29/2020  Expected End Date: 10/29/2021  Recent Progress: On track  Priority: Medium  Note:    CARE PLAN ENTRY (see longitudinal plan of care for additional care plan information)  Current Barriers:  . Chronic Disease Management support and education needs related to DM with ESRD, Multiple Myeloma Nurse Case Manager Clinical Goal(s):  [name] Patient will work with PCP care team and Oncology to address needs related to disease education and support for ongoing treatment of Multiple Myeloma  CCM RN CM Interventions:  01/23/21 completed successful outbound call with patient's [name]    . Inter-disciplinary care team collaboration (see longitudinal plan of care) . Evaluation of  current treatment plan related to Multiple Myeloma and patient's adherence to plan as established by provider . Determined patient continues to be under the care of Dr. Alen Blew with [facility]. Medical Oncology with most recent f/u completed on 11/20/20; . 55 year old man with: . 1.    Multiple myeloma arising from MGUS noted in 2019.  He has IgG kappa subtype. [name] He is currently on active surveillance after receiving daratumumab maintenance until December 2021.  Laboratory data obtained on November 13, 2020 were personally reviewed and discussed today.  He continues to have excellent response to therapy with normalization of his IgG level, no detected M spike and mild elevation in his kappa to lambda ratio. . Treatment options moving forward were discussed including maintenance therapy, continued active surveillance or salvage treatment he develops relapsed disease.  After discussion today, we will continue with active surveillance at this time  and he is agreeable. . 2. Anemia:  Related to plasma cell disorder and renal failure.  Hemoglobin is adequate at this time. . 3 Follow-up: In 3 months for a repeat follow-up . I discussed the assessment and treatment plan with the patient. The patient was provided an opportunity to ask questions and all were answered. The patient agreed with the plan and demonstrated an understanding of the instructions. . The patient was advised to call back or seek an in-person evaluation if the symptoms worsen or if the condition fails to improve as anticipated. . Determined wife and patient's wife verbalizes having a good understanding of the patient's current plan of care related to his Cancer treatment and when to call the doctor if needed   Educated on importance to report fever or signs of infection promptly   Discussed plans with patient for ongoing care management follow up and provided patient with direct contact information for care management team Patient  Self Care Activities:  . Self administers medications as prescribed . Attends all scheduled provider appointments . Calls pharmacy for medication refills . Calls provider office for new concerns or questions  Next Scheduled Follow Up Date: 04/22/21   Plan:Telephone follow up appointment with care management team member scheduled for:  04/22/21  Barb Merino, RN, BSN, CCM Care Management Coordinator [facility]  Direct Phone: [phone]

## 2021-02-08 NOTE — Patient Instructions (Signed)
Goals Addressed    . Infection Prevented or Managed   On track    Timeframe:  Long-Range Goal Priority:  Medium Start Date:  10/29/20                           Expected End Date:  10/29/21     Next Follow Up Date: 04/22/21  Patient Goals/Self Care Activities:  Self administers medications as prescribed Attends all scheduled provider appointments Calls pharmacy for medication refills Calls provider office for new concerns or questions

## 2021-02-09 LAB — LIPID PANEL
Cholesterol: 135 (ref 0–200)
HDL: 51 (ref 35–70)
LDL Cholesterol: 68
Triglycerides: 78 (ref 40–160)

## 2021-02-09 LAB — COMPREHENSIVE METABOLIC PANEL: Calcium: 10 (ref 8.7–10.7)

## 2021-02-09 LAB — HEMOGLOBIN A1C: Hemoglobin A1C: 5.7

## 2021-02-09 LAB — CBC AND DIFFERENTIAL: Hemoglobin: 26.4 — AB (ref 13.5–17.5)

## 2021-02-11 ENCOUNTER — Other Ambulatory Visit (HOSPITAL_COMMUNITY): Payer: Self-pay

## 2021-02-11 DIAGNOSIS — Z992 Dependence on renal dialysis: Secondary | ICD-10-CM | POA: Diagnosis not present

## 2021-02-11 DIAGNOSIS — D631 Anemia in chronic kidney disease: Secondary | ICD-10-CM | POA: Diagnosis not present

## 2021-02-11 DIAGNOSIS — N2581 Secondary hyperparathyroidism of renal origin: Secondary | ICD-10-CM | POA: Diagnosis not present

## 2021-02-11 DIAGNOSIS — N186 End stage renal disease: Secondary | ICD-10-CM | POA: Diagnosis not present

## 2021-02-11 MED ORDER — PANTOPRAZOLE SODIUM 40 MG PO TBEC
DELAYED_RELEASE_TABLET | Freq: Every day | ORAL | 1 refills | Status: DC
Start: 2021-02-11 — End: 2021-07-08
  Filled 2021-02-11: qty 90, 90d supply, fill #0
  Filled 2021-04-26: qty 90, 90d supply, fill #1

## 2021-02-12 ENCOUNTER — Other Ambulatory Visit: Payer: Self-pay

## 2021-02-12 ENCOUNTER — Inpatient Hospital Stay: Payer: Medicare Other | Attending: Oncology

## 2021-02-12 DIAGNOSIS — N400 Enlarged prostate without lower urinary tract symptoms: Secondary | ICD-10-CM | POA: Diagnosis not present

## 2021-02-12 DIAGNOSIS — N186 End stage renal disease: Secondary | ICD-10-CM

## 2021-02-12 DIAGNOSIS — Z992 Dependence on renal dialysis: Secondary | ICD-10-CM | POA: Diagnosis not present

## 2021-02-12 DIAGNOSIS — D631 Anemia in chronic kidney disease: Secondary | ICD-10-CM | POA: Diagnosis not present

## 2021-02-12 DIAGNOSIS — N189 Chronic kidney disease, unspecified: Secondary | ICD-10-CM | POA: Insufficient documentation

## 2021-02-12 DIAGNOSIS — C9000 Multiple myeloma not having achieved remission: Secondary | ICD-10-CM | POA: Insufficient documentation

## 2021-02-12 DIAGNOSIS — D638 Anemia in other chronic diseases classified elsewhere: Secondary | ICD-10-CM

## 2021-02-12 DIAGNOSIS — E1122 Type 2 diabetes mellitus with diabetic chronic kidney disease: Secondary | ICD-10-CM

## 2021-02-12 DIAGNOSIS — N401 Enlarged prostate with lower urinary tract symptoms: Secondary | ICD-10-CM

## 2021-02-12 DIAGNOSIS — R3911 Hesitancy of micturition: Secondary | ICD-10-CM | POA: Insufficient documentation

## 2021-02-12 LAB — CMP (CANCER CENTER ONLY)
ALT: 10 U/L (ref 0–44)
AST: 14 U/L — ABNORMAL LOW (ref 15–41)
Albumin: 4.3 g/dL (ref 3.5–5.0)
Alkaline Phosphatase: 109 U/L (ref 38–126)
Anion gap: 16 — ABNORMAL HIGH (ref 5–15)
BUN: 35 mg/dL — ABNORMAL HIGH (ref 6–20)
CO2: 31 mmol/L (ref 22–32)
Calcium: 9.8 mg/dL (ref 8.9–10.3)
Chloride: 95 mmol/L — ABNORMAL LOW (ref 98–111)
Creatinine: 8.79 mg/dL (ref 0.61–1.24)
GFR, Estimated: 6 mL/min — ABNORMAL LOW (ref >60)
Glucose, Bld: 143 mg/dL — ABNORMAL HIGH (ref 70–99)
Potassium: 3.7 mmol/L (ref 3.5–5.1)
Sodium: 142 mmol/L (ref 135–145)
Total Bilirubin: 0.9 mg/dL (ref 0.3–1.2)
Total Protein: 7.6 g/dL (ref 6.5–8.1)

## 2021-02-12 LAB — CBC WITH DIFFERENTIAL (CANCER CENTER ONLY)
Abs Immature Granulocytes: 0.03 10*3/uL (ref 0.00–0.07)
Basophils Absolute: 0.1 10*3/uL (ref 0.0–0.1)
Basophils Relative: 1 %
Eosinophils Absolute: 0.6 10*3/uL — ABNORMAL HIGH (ref 0.0–0.5)
Eosinophils Relative: 5 %
HCT: 23.9 % — ABNORMAL LOW (ref 39.0–52.0)
Hemoglobin: 8.9 g/dL — ABNORMAL LOW (ref 13.0–17.0)
Immature Granulocytes: 0 %
Lymphocytes Relative: 11 %
Lymphs Abs: 1.1 10*3/uL (ref 0.7–4.0)
MCH: 32.6 pg (ref 26.0–34.0)
MCHC: 37.2 g/dL — ABNORMAL HIGH (ref 30.0–36.0)
MCV: 87.5 fL (ref 80.0–100.0)
Monocytes Absolute: 1.2 10*3/uL — ABNORMAL HIGH (ref 0.1–1.0)
Monocytes Relative: 11 %
Neutro Abs: 7.5 10*3/uL (ref 1.7–7.7)
Neutrophils Relative %: 72 %
Platelet Count: 372 10*3/uL (ref 150–400)
RBC: 2.73 MIL/uL — ABNORMAL LOW (ref 4.22–5.81)
RDW: 15.6 % — ABNORMAL HIGH (ref 11.5–15.5)
WBC Count: 10.4 10*3/uL (ref 4.0–10.5)
nRBC: 3.6 % — ABNORMAL HIGH (ref 0.0–0.2)

## 2021-02-12 NOTE — Progress Notes (Unsigned)
CRITICAL VALUE STICKER  CRITICAL VALUE:Cr:8.79  DATE & TIME NOTIFIED: 02/12/2021 1:06pm  MD NOTIFIED: Julien Nordmann  TIME OF NOTIFICATION:1:09 pm

## 2021-02-13 DIAGNOSIS — N2581 Secondary hyperparathyroidism of renal origin: Secondary | ICD-10-CM | POA: Diagnosis not present

## 2021-02-13 DIAGNOSIS — N186 End stage renal disease: Secondary | ICD-10-CM | POA: Diagnosis not present

## 2021-02-13 DIAGNOSIS — Z992 Dependence on renal dialysis: Secondary | ICD-10-CM | POA: Diagnosis not present

## 2021-02-13 DIAGNOSIS — D631 Anemia in chronic kidney disease: Secondary | ICD-10-CM | POA: Diagnosis not present

## 2021-02-13 LAB — KAPPA/LAMBDA LIGHT CHAINS
Kappa free light chain: 183.9 mg/L — ABNORMAL HIGH (ref 3.3–19.4)
Kappa, lambda light chain ratio: 2.38 — ABNORMAL HIGH (ref 0.26–1.65)
Lambda free light chains: 77.2 mg/L — ABNORMAL HIGH (ref 5.7–26.3)

## 2021-02-13 LAB — PSA, TOTAL AND FREE
PSA, Free Pct: 46.7 %
PSA, Free: 0.7 ng/mL
Prostate Specific Ag, Serum: 1.5 ng/mL (ref 0.0–4.0)

## 2021-02-14 ENCOUNTER — Other Ambulatory Visit: Payer: Self-pay | Admitting: Internal Medicine

## 2021-02-14 ENCOUNTER — Encounter: Payer: Self-pay | Admitting: Internal Medicine

## 2021-02-14 DIAGNOSIS — E1065 Type 1 diabetes mellitus with hyperglycemia: Secondary | ICD-10-CM | POA: Diagnosis not present

## 2021-02-15 ENCOUNTER — Other Ambulatory Visit (HOSPITAL_COMMUNITY): Payer: Self-pay

## 2021-02-15 DIAGNOSIS — Z992 Dependence on renal dialysis: Secondary | ICD-10-CM | POA: Diagnosis not present

## 2021-02-15 DIAGNOSIS — N186 End stage renal disease: Secondary | ICD-10-CM | POA: Diagnosis not present

## 2021-02-15 DIAGNOSIS — D631 Anemia in chronic kidney disease: Secondary | ICD-10-CM | POA: Diagnosis not present

## 2021-02-15 DIAGNOSIS — N2581 Secondary hyperparathyroidism of renal origin: Secondary | ICD-10-CM | POA: Diagnosis not present

## 2021-02-15 LAB — MULTIPLE MYELOMA PANEL, SERUM
Albumin SerPl Elph-Mcnc: 4.4 g/dL (ref 2.9–4.4)
Albumin/Glob SerPl: 1.7 (ref 0.7–1.7)
Alpha 1: 0.4 g/dL (ref 0.0–0.4)
Alpha2 Glob SerPl Elph-Mcnc: 0.8 g/dL (ref 0.4–1.0)
B-Globulin SerPl Elph-Mcnc: 0.8 g/dL (ref 0.7–1.3)
Gamma Glob SerPl Elph-Mcnc: 0.7 g/dL (ref 0.4–1.8)
Globulin, Total: 2.7 g/dL (ref 2.2–3.9)
IgA: 70 mg/dL — ABNORMAL LOW (ref 90–386)
IgG (Immunoglobin G), Serum: 729 mg/dL (ref 603–1613)
IgM (Immunoglobulin M), Srm: 30 mg/dL (ref 20–172)
Total Protein ELP: 7.1 g/dL (ref 6.0–8.5)

## 2021-02-15 MED ORDER — MIDODRINE HCL 10 MG PO TABS
ORAL_TABLET | ORAL | 0 refills | Status: DC
  Filled 2021-02-15: qty 60, 30d supply, fill #0
  Filled 2021-02-18: qty 54, 27d supply, fill #0
  Filled 2021-02-18: qty 6, 3d supply, fill #0

## 2021-02-18 ENCOUNTER — Other Ambulatory Visit (HOSPITAL_COMMUNITY): Payer: Self-pay

## 2021-02-18 DIAGNOSIS — Z992 Dependence on renal dialysis: Secondary | ICD-10-CM | POA: Diagnosis not present

## 2021-02-18 DIAGNOSIS — D631 Anemia in chronic kidney disease: Secondary | ICD-10-CM | POA: Diagnosis not present

## 2021-02-18 DIAGNOSIS — N186 End stage renal disease: Secondary | ICD-10-CM | POA: Diagnosis not present

## 2021-02-18 DIAGNOSIS — N2581 Secondary hyperparathyroidism of renal origin: Secondary | ICD-10-CM | POA: Diagnosis not present

## 2021-02-19 ENCOUNTER — Inpatient Hospital Stay (HOSPITAL_BASED_OUTPATIENT_CLINIC_OR_DEPARTMENT_OTHER): Payer: Medicare Other | Admitting: Oncology

## 2021-02-19 ENCOUNTER — Other Ambulatory Visit: Payer: Self-pay

## 2021-02-19 ENCOUNTER — Other Ambulatory Visit (HOSPITAL_COMMUNITY): Payer: Self-pay

## 2021-02-19 VITALS — BP 145/51 | HR 76 | Temp 97.1°F | Resp 18 | Wt 187.8 lb

## 2021-02-19 DIAGNOSIS — C9000 Multiple myeloma not having achieved remission: Secondary | ICD-10-CM | POA: Diagnosis not present

## 2021-02-19 DIAGNOSIS — R3911 Hesitancy of micturition: Secondary | ICD-10-CM | POA: Diagnosis not present

## 2021-02-19 DIAGNOSIS — Z992 Dependence on renal dialysis: Secondary | ICD-10-CM | POA: Diagnosis not present

## 2021-02-19 DIAGNOSIS — N189 Chronic kidney disease, unspecified: Secondary | ICD-10-CM | POA: Diagnosis not present

## 2021-02-19 DIAGNOSIS — D631 Anemia in chronic kidney disease: Secondary | ICD-10-CM | POA: Diagnosis not present

## 2021-02-19 DIAGNOSIS — N400 Enlarged prostate without lower urinary tract symptoms: Secondary | ICD-10-CM | POA: Diagnosis not present

## 2021-02-19 NOTE — Progress Notes (Signed)
Hematology and Oncology Follow Up Visit  [place] [id number] 06/06/1966 55 y.o. 02/19/2021 11:56 AM    Principle Diagnosis: 55 year old man with multiple myeloma diagnosed stated 2019.  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   Past therapy: Velcade dexamethasone and cyclophosphamide weekly started on 05/25/2018.  Velcade is given at 1.5 mg/m weekly, dexamethasone is 20 mg weekly and and Cytoxan is given at 650 mg total dose.  Last treatment was given in August 2020.    Therapy was held today in anticipation of high-dose chemotherapy and autologous stem cell transplant.  He did not receive stem cell transplant due to subdural hematoma and sepsis in September 2020.  Velcade at 1.5 mg per metered squared subcutaneously weekly, dexamethasone 20 mg weekly, daratumumab 1800 mg subcutaneous injection.   Cycle 1 started on 08/30/2019.  Therapy concluded in June 2021.   Daratumumab monthly maintenance started in June 2021.  Therapy discontinued in December 2021.  Current therapy: Active surveillance.  Interim History:  Mr. Fullam returns today for a follow-up visit.  Since the last visit, he reports no major changes in his health.  He traveled to [place] [facility] to visit his [place] and was evaluated while there for pelvic discomfort that has resolved at this time.  Denies any nausea, vomiting or abdominal pain.  He denies any recent hospitalizations otherwise.  He denies any hematochezia, melena or hemoptysis.  His performance status quality of life remained reasonable.                      Medications: Unchanged on review    Current Outpatient Medications:    acetaminophen (TYLENOL) 325 MG tablet, Take 1-2 tablets (325-650 mg total) by mouth every 4 (four) hours as needed for mild pain., Disp:  , Rfl:    albuterol (PROAIR HFA) 108 (90 Base) MCG/ACT inhaler, Inhale two puffs every 4-6 hours if needed for cough or wheeze (Patient taking differently: Inhale 2 puffs into the lungs  every 4 (four) hours as needed for wheezing or shortness of breath. Inhale two puffs every 4-6 hours if needed for cough or wheeze), Disp: 6.7 g, Rfl: 2   Alcohol Swabs (SM ALCOHOL PREP) 70 % PADS, , Disp: , Rfl:    atorvastatin (LIPITOR) 20 MG tablet, TAKE 1 TABLET BY MOUTH ONCE A DAY (Patient taking differently: Take 20 mg by mouth daily.), Disp: 90 tablet, Rfl: 2   AURYXIA 1 GM 210 MG(Fe) tablet, Take 210 mg by mouth every evening., Disp: , Rfl:    calcitRIOL (ROCALTROL) 0.25 MCG capsule, Take 0.25 mcg by mouth daily., Disp: , Rfl:    calcium acetate (PHOSLO) 667 MG capsule, TAKE 3 CAPSULES BY MOUTH THREE TIMES DAILY WITH MEALS AND 2 CAPSULES TWO TIMES DAILY WITH SNACKS (Patient taking differently: Take 667 mg by mouth 3 (three) times daily with meals.), Disp: 390 capsule, Rfl: 10   camphor-menthol (SARNA) lotion, Apply 1 application topically every 8 (eight) hours as needed for itching., Disp: 222 mL, Rfl: 0   Cetirizine HCl 10 MG CAPS, Take 1 capsule by mouth daily. , Disp: , Rfl:    Cholecalciferol (D3-1000 PO), Take 1 tablet by mouth See admin instructions. Take one tablet by mouth on Monday,Wednesday and Fridays, Disp: , Rfl:    clonazePAM (KLONOPIN) 0.5 MG tablet, TAKE 2 TABLETS EVERY MONDAY, WEDNESDAY, AND FRIDAY WITH HEMODIALYSIS, Disp: 30 tablet, Rfl: 5   clonazePAM (KLONOPIN) 0.5 MG tablet, Take 1 tablet (0.5 mg total) by mouth at bedtime.  Also take 71m in morning on Monday, Wednesday, Friday before dialysis, Disp: 60 tablet, Rfl: 0   clonazePAM (KLONOPIN) 0.5 MG tablet, Take 2 tablets (1 mg total) by mouth in morning on Mon, Wed, and Friday before dialysis and take 1 tablet at bedtime, Disp: 60 tablet, Rfl: 0   dexamethasone (DECADRON) 4 MG tablet, Take 4 mg by mouth daily., Disp: , Rfl:    dextrose 50 % solution, Dextrose 50%, Disp: , Rfl:    fluticasone furoate-vilanterol (BREO ELLIPTA) 200-25 MCG/INH AEPB, INHALE 1 PUFF BY MOUTH INTO THE LUNGS AT THE SAME TIME EVERY DAY, Disp: 60 each,  Rfl: 2   FREESTYLE LITE test strip, , Disp: , Rfl:    Glucagon, rDNA, (GLUCAGON EMERGENCY) 1 MG KIT, See admin instructions., Disp: , Rfl:    Insulin Glargine (BASAGLAR KWIKPEN) 100 UNIT/ML, INJECT 6 UNITS UNDER THE SKIN ONCE A DAY (DISCARD 28 DAYS AFTER FIRST USE) (Patient taking differently: Inject 6 Units into the skin daily.), Disp: 3 mL, Rfl: 6   Insulin Lispro (HUMALOG KWIKPEN [place]), Inject 2-4 Units into the skin 3 (three) times daily before meals. Sliding scale: 150-250 =2 units, 250 -350=3 units, 350 += 4 units, Disp: , Rfl:    Insulin Pen Needle 32G X 4 MM MISC, USE AS DIRECTED 4 TO 5 TIMES DAILY TO INJECT INSULIN, Disp: 400 each, Rfl: 6   lacosamide (VIMPAT) 200 MG TABS tablet, TAKE 1 TABLET TWICE A DAY EXCEPT ON DIALYSIS DAYS M-W-F GIVE 1 TAB IN AM, 1 TAB AFTER DIALYSIS, 1 TAB IN PM., Disp: 90 tablet, Rfl: 5   lamoTRIgine (LAMICTAL) 200 MG tablet, TAKE 1 TABLET BY MOUTH 2 TIMES DAILY, Disp: 60 tablet, Rfl: 3   Lancets (FREESTYLE) lancets, , Disp: , Rfl:    midodrine (PROAMATINE) 10 MG tablet, TAKE 1 TABLET BY MOUTH 2 TIMES DAILY WITH MEALS, Disp: 60 tablet, Rfl: 0   multivitamin (RENA-VIT) TABS tablet, Take 1 tablet by mouth at bedtime., Disp: , Rfl:    pantoprazole (PROTONIX) 40 MG tablet, TAKE 1 TABLET BY MOUTH ONCE DAILY, Disp: 90 tablet, Rfl: 1   Polyethyl Glycol-Propyl Glycol (SYSTANE OP), Place 1 drop into the left eye daily as needed (For drops)., Disp: , Rfl:    prednisoLONE acetate (PRED FORTE) 1 % ophthalmic suspension, Instill 1 drop Right Eye 3 times a day, Disp: 10 mL, Rfl: 1   UNIFINE PENTIPS 31G X 8 MM MISC, USE AS DIRECTED DAILY AFTER SUPPER, Disp: 100 each, Rfl: 3  Allergies:  Allergies  Allergen Reactions   Codeine Swelling   Codeine Rash and Other (See Comments)    Unknown reaction (patient says it was more serious than just a rash, but he can't remember what happened)       Physical Exam:                 ECOG: 1  General appearance: Alert,  awake without any distress. Head: Atraumatic without abnormalities Oropharynx: Without any thrush or ulcers. Eyes: No scleral icterus. Lymph nodes: No lymphadenopathy noted in the cervical, supraclavicular, or axillary nodes Heart:regular rate and rhythm, without any murmurs or gallops.   Lung: Clear to auscultation without any rhonchi, wheezes or dullness to percussion. Abdomin: Soft, nontender without any shifting dullness or ascites. Musculoskeletal: No clubbing or cyanosis. Neurological: No motor or sensory deficits. Skin: No rashes or lesions.                     Lab Results: Lab Results  Component Value Date   WBC 10.4 02/12/2021   HGB 8.9 (L) 02/12/2021   HCT 23.9 (L) 02/12/2021   MCV 87.5 02/12/2021   PLT 372 02/12/2021     Chemistry      Component Value Date/Time   NA 142 02/12/2021 1158   NA 141 07/19/2020 0000   NA 138 07/03/2017 0813   K 3.7 02/12/2021 1158   K 4.2 07/03/2017 0813   CL 95 (L) 02/12/2021 1158   CO2 31 02/12/2021 1158   CO2 27 07/03/2017 0813   BUN 35 (H) 02/12/2021 1158   BUN 65 (A) 08/15/2020 0000   BUN 46.4 (H) 07/03/2017 0813   CREATININE 8.79 (HH) 02/12/2021 1158   CREATININE 10.9 (HH) 07/03/2017 0813   GLU 162 08/15/2020 0000      Component Value Date/Time   CALCIUM 9.8 02/12/2021 1158   CALCIUM 9.3 07/03/2017 0813   ALKPHOS 109 02/12/2021 1158   ALKPHOS 77 07/03/2017 0813   AST 14 (L) 02/12/2021 1158   AST 16 07/03/2017 0813   ALT 10 02/12/2021 1158   ALT 14 07/03/2017 0813   BILITOT 0.9 02/12/2021 1158   BILITOT 0.93 07/03/2017 0813     Results for QUAVON, KEISLING (MRN [id number]) as of 02/19/2021 12:22  Ref. Range 11/13/2020 10:22 02/12/2021 11:58  M Protein SerPl Elph-Mcnc Latest Ref Range: Not Observed g/dL Not Observed Not Observed  IFE 1 Unknown Comment Comment  Globulin, Total Latest Ref Range: 2.2 - 3.9 g/dL 2.6 2.7  B-Globulin SerPl Elph-Mcnc Latest Ref Range: 0.7 - 1.3 g/dL 0.8 0.8  IgG (Immunoglobin  G), Serum Latest Ref Range: 603 - 1,613 mg/dL 620 729  IgM (Immunoglobulin M), Srm Latest Ref Range: 20 - 172 mg/dL 30 30  IgA Latest Ref Range: 90 - 386 mg/dL 53 (L) 70 (L)    Impression and Plan:  55 year old man with:  1.  Multiple myeloma diagnosed in 2019.  He has IgG kappa at this time.   His disease status was updated at this time and treatment options were reviewed.  Protein studies obtained on February 12, 2021 were personally reviewed and continues to show complete response to therapy with normal quantitative immunoglobulins and no M spike detected.  His kappa to lambda free light chain ratio remains still elevated which likely indicate residual disease.  Risks and benefits of resuming induction therapy versus resuming maintenance treatment versus active surveillance were discussed.  Reinstituting antimyeloma therapy upon progression of disease was discussed.  After discussion today, we opted to continue with active surveillance and reintroduce systemic therapy upon signs of progression.  2. Anemia: Related to renal failure without any evidence of bleeding.  No transfusion is needed at this time.   3.  Renal failure: Currently hemodialysis dependent without any recent complications.  4. Goals of care: Therapy remains palliative although aggressive measures are warranted given his recent pulm status and young age.   6. Follow-up: He will return in 3 months for repeat follow-up repeat protein studies.   30 minutes were spent on this visit.  The time was dedicated to reviewing laboratory data, disease status update and outlining future plan of care.  Zola Button, MD 6/14/202211:56 AM

## 2021-02-20 DIAGNOSIS — N186 End stage renal disease: Secondary | ICD-10-CM | POA: Diagnosis not present

## 2021-02-20 DIAGNOSIS — N2581 Secondary hyperparathyroidism of renal origin: Secondary | ICD-10-CM | POA: Diagnosis not present

## 2021-02-20 DIAGNOSIS — D631 Anemia in chronic kidney disease: Secondary | ICD-10-CM | POA: Diagnosis not present

## 2021-02-20 DIAGNOSIS — Z992 Dependence on renal dialysis: Secondary | ICD-10-CM | POA: Diagnosis not present

## 2021-02-22 DIAGNOSIS — N186 End stage renal disease: Secondary | ICD-10-CM | POA: Diagnosis not present

## 2021-02-22 DIAGNOSIS — D631 Anemia in chronic kidney disease: Secondary | ICD-10-CM | POA: Diagnosis not present

## 2021-02-22 DIAGNOSIS — Z992 Dependence on renal dialysis: Secondary | ICD-10-CM | POA: Diagnosis not present

## 2021-02-22 DIAGNOSIS — N2581 Secondary hyperparathyroidism of renal origin: Secondary | ICD-10-CM | POA: Diagnosis not present

## 2021-02-25 ENCOUNTER — Other Ambulatory Visit (HOSPITAL_COMMUNITY): Payer: Self-pay

## 2021-02-25 DIAGNOSIS — D631 Anemia in chronic kidney disease: Secondary | ICD-10-CM | POA: Diagnosis not present

## 2021-02-25 DIAGNOSIS — N2581 Secondary hyperparathyroidism of renal origin: Secondary | ICD-10-CM | POA: Diagnosis not present

## 2021-02-25 DIAGNOSIS — N186 End stage renal disease: Secondary | ICD-10-CM | POA: Diagnosis not present

## 2021-02-25 DIAGNOSIS — Z992 Dependence on renal dialysis: Secondary | ICD-10-CM | POA: Diagnosis not present

## 2021-02-25 MED FILL — Calcium Acetate (Phosphate Binder) Cap 667 MG (169 MG Ca): ORAL | 30 days supply | Qty: 390 | Fill #0 | Status: CN

## 2021-02-26 ENCOUNTER — Other Ambulatory Visit (HOSPITAL_COMMUNITY): Payer: Self-pay

## 2021-02-26 DIAGNOSIS — C9000 Multiple myeloma not having achieved remission: Secondary | ICD-10-CM | POA: Diagnosis not present

## 2021-02-26 DIAGNOSIS — D649 Anemia, unspecified: Secondary | ICD-10-CM | POA: Diagnosis not present

## 2021-02-26 DIAGNOSIS — I10 Essential (primary) hypertension: Secondary | ICD-10-CM | POA: Diagnosis not present

## 2021-02-26 DIAGNOSIS — E109 Type 1 diabetes mellitus without complications: Secondary | ICD-10-CM | POA: Diagnosis not present

## 2021-02-26 DIAGNOSIS — N186 End stage renal disease: Secondary | ICD-10-CM | POA: Diagnosis not present

## 2021-02-26 MED ORDER — BASAGLAR KWIKPEN 100 UNIT/ML ~~LOC~~ SOPN
PEN_INJECTOR | SUBCUTANEOUS | 6 refills | Status: DC
  Filled 2021-02-26: qty 3, 50d supply, fill #0

## 2021-02-26 MED ORDER — INSULIN GLARGINE-YFGN 100 UNIT/ML ~~LOC~~ SOPN
PEN_INJECTOR | SUBCUTANEOUS | 4 refills | Status: DC
  Filled 2021-02-26: qty 6, 90d supply, fill #0
  Filled 2021-03-04: qty 9, 84d supply, fill #0

## 2021-02-26 MED ORDER — BASAGLAR KWIKPEN 100 UNIT/ML ~~LOC~~ SOPN
PEN_INJECTOR | SUBCUTANEOUS | 6 refills | Status: DC
  Filled 2021-02-26: qty 6, 66d supply, fill #0

## 2021-02-27 DIAGNOSIS — N186 End stage renal disease: Secondary | ICD-10-CM | POA: Diagnosis not present

## 2021-02-27 DIAGNOSIS — Z992 Dependence on renal dialysis: Secondary | ICD-10-CM | POA: Diagnosis not present

## 2021-02-27 DIAGNOSIS — D631 Anemia in chronic kidney disease: Secondary | ICD-10-CM | POA: Diagnosis not present

## 2021-02-27 DIAGNOSIS — N2581 Secondary hyperparathyroidism of renal origin: Secondary | ICD-10-CM | POA: Diagnosis not present

## 2021-02-28 DIAGNOSIS — N186 End stage renal disease: Secondary | ICD-10-CM | POA: Diagnosis not present

## 2021-02-28 DIAGNOSIS — Z992 Dependence on renal dialysis: Secondary | ICD-10-CM | POA: Diagnosis not present

## 2021-02-28 DIAGNOSIS — T82858A Stenosis of vascular prosthetic devices, implants and grafts, initial encounter: Secondary | ICD-10-CM | POA: Diagnosis not present

## 2021-02-28 DIAGNOSIS — I871 Compression of vein: Secondary | ICD-10-CM | POA: Diagnosis not present

## 2021-03-01 ENCOUNTER — Other Ambulatory Visit (HOSPITAL_COMMUNITY): Payer: Self-pay

## 2021-03-01 DIAGNOSIS — Z992 Dependence on renal dialysis: Secondary | ICD-10-CM | POA: Diagnosis not present

## 2021-03-01 DIAGNOSIS — N2581 Secondary hyperparathyroidism of renal origin: Secondary | ICD-10-CM | POA: Diagnosis not present

## 2021-03-01 DIAGNOSIS — D631 Anemia in chronic kidney disease: Secondary | ICD-10-CM | POA: Diagnosis not present

## 2021-03-01 DIAGNOSIS — N186 End stage renal disease: Secondary | ICD-10-CM | POA: Diagnosis not present

## 2021-03-02 ENCOUNTER — Other Ambulatory Visit (HOSPITAL_COMMUNITY): Payer: Self-pay

## 2021-03-04 ENCOUNTER — Other Ambulatory Visit (HOSPITAL_COMMUNITY): Payer: Self-pay

## 2021-03-04 DIAGNOSIS — Z992 Dependence on renal dialysis: Secondary | ICD-10-CM | POA: Diagnosis not present

## 2021-03-04 DIAGNOSIS — N186 End stage renal disease: Secondary | ICD-10-CM | POA: Diagnosis not present

## 2021-03-04 DIAGNOSIS — D631 Anemia in chronic kidney disease: Secondary | ICD-10-CM | POA: Diagnosis not present

## 2021-03-04 DIAGNOSIS — N2581 Secondary hyperparathyroidism of renal origin: Secondary | ICD-10-CM | POA: Diagnosis not present

## 2021-03-04 MED FILL — Insulin Glargine Soln Pen-Injector 100 Unit/ML: SUBCUTANEOUS | 28 days supply | Qty: 3 | Fill #0 | Status: CN

## 2021-03-06 ENCOUNTER — Other Ambulatory Visit: Payer: Self-pay | Admitting: Neurology

## 2021-03-06 ENCOUNTER — Other Ambulatory Visit (HOSPITAL_COMMUNITY): Payer: Self-pay

## 2021-03-06 DIAGNOSIS — N186 End stage renal disease: Secondary | ICD-10-CM | POA: Diagnosis not present

## 2021-03-06 DIAGNOSIS — D631 Anemia in chronic kidney disease: Secondary | ICD-10-CM | POA: Diagnosis not present

## 2021-03-06 DIAGNOSIS — N2581 Secondary hyperparathyroidism of renal origin: Secondary | ICD-10-CM | POA: Diagnosis not present

## 2021-03-06 DIAGNOSIS — Z992 Dependence on renal dialysis: Secondary | ICD-10-CM | POA: Diagnosis not present

## 2021-03-06 MED ORDER — AURYXIA 1 GM 210 MG(FE) PO TABS
ORAL_TABLET | ORAL | 3 refills | Status: DC
  Filled 2021-03-06: qty 270, 90d supply, fill #0
  Filled 2022-01-11: qty 270, 90d supply, fill #1

## 2021-03-07 ENCOUNTER — Other Ambulatory Visit (HOSPITAL_COMMUNITY): Payer: Self-pay

## 2021-03-07 ENCOUNTER — Other Ambulatory Visit: Payer: Self-pay

## 2021-03-07 ENCOUNTER — Encounter: Payer: Self-pay | Admitting: Nurse Practitioner

## 2021-03-07 ENCOUNTER — Ambulatory Visit (INDEPENDENT_AMBULATORY_CARE_PROVIDER_SITE_OTHER): Payer: Medicare Other | Admitting: Nurse Practitioner

## 2021-03-07 VITALS — BP 114/68 | HR 73 | Temp 98.1°F | Ht 71.0 in | Wt 186.0 lb

## 2021-03-07 DIAGNOSIS — N186 End stage renal disease: Secondary | ICD-10-CM | POA: Diagnosis not present

## 2021-03-07 DIAGNOSIS — Z992 Dependence on renal dialysis: Secondary | ICD-10-CM

## 2021-03-07 DIAGNOSIS — E1122 Type 2 diabetes mellitus with diabetic chronic kidney disease: Secondary | ICD-10-CM

## 2021-03-07 DIAGNOSIS — G8110 Spastic hemiplegia affecting unspecified side: Secondary | ICD-10-CM | POA: Diagnosis not present

## 2021-03-07 DIAGNOSIS — C9000 Multiple myeloma not having achieved remission: Secondary | ICD-10-CM | POA: Diagnosis not present

## 2021-03-07 NOTE — Progress Notes (Signed)
I,Katawbba Wiggins,acting as a Education administrator for [employer], NP.,have documented all relevant documentation on the behalf of [employer], NP,as directed by  Bary Castilla, NP while in the presence of Bary Castilla, NP.  This visit occurred during the SARS-CoV-2 public health emergency.  Safety protocols were in place, including screening questions prior to the visit, additional usage of staff PPE, and extensive cleaning of exam room while observing appropriate contact time as indicated for disinfecting solutions.  Subjective:     Patient ID: Isaiah [name] , male    DOB: 1965-11-24 , 55 y.o.   MRN: [id number]   Chief Complaint  Patient presents with   hospital f/u    HPI  The patient is here today for a hospital f/u. He was admitted from 4/25-4/29 due to epilepsy monitoring and hallucination. He was recently out of town. He is currently being seen by a oncologist and a neurologist. He recently had a trip to [place] and he visited some family. He has Dialysis M,W,F  Neurologist coming up in November. They made changes with the klonopin in the hospital. Added klonopin 0.5 mg - 1 mg.  He sees the oncologist when he gets dialysis and they do frequent blood work there. He is hydrating and eating well.   He reports no major changes in his health.  Denies any nausea, headache, chest pain, shortness of breath, vomiting or abdominal pain.  He denies any recent hospitalizations since his last.     Past Medical History:  Diagnosis Date   A-fib ([place])    Anemia    Diabetic retinopathy ([place])    PDR OU   DM type 2 (diabetes mellitus, type 2) ([place]) 06/09/2019   ESRD (end stage renal disease) on dialysis [facility])    Essential hypertension 06/09/2019   GIB (gastrointestinal bleeding)    Recurrent episodes- 09/2014, 09/2015 and 05/2016   Gout    History of recent blood transfusion 10/27/14   2 Units PRBC's   Hyperkalemia 07/2011   Hypertensive retinopathy    OU   Multiple myeloma ([place])     Pulmonary embolism ([place]) 07/2011; 09/27/2014   a. Bilat PE 07/2011 - unclear cause, tx with 6 months Coumadin.;    Seizure disorder ([place]) 06/09/2019   Sepsis ([place])    Sickle cell-thalassemia disease ([place])    a. Sickle cell trait.   Stroke ([place]) 09/2015   R-MCA, L-MCA, PCA and bilateral cerebellar complicated by DVT/PE   Subdural hematoma ([place]) 05/2019     Family History  Problem Relation Age of Onset   Hypertension Mother    Hypertension Father    Stroke Father    Sickle cell anemia Brother    Diabetes Mother    Sickle cell anemia Brother    Diabetes type I Brother    Kidney disease Brother      Current Outpatient Medications:    acetaminophen (TYLENOL) 325 MG tablet, Take 1-2 tablets (325-650 mg total) by mouth every 4 (four) hours as needed for mild pain., Disp:  , Rfl:    albuterol (PROAIR HFA) 108 (90 Base) MCG/ACT inhaler, Inhale two puffs every 4-6 hours if needed for cough or wheeze (Patient taking differently: Inhale 2 puffs into the lungs every 4 (four) hours as needed for wheezing or shortness of breath. Inhale two puffs every 4-6 hours if needed for cough or wheeze), Disp: 6.7 g, Rfl: 2   Alcohol Swabs (SM ALCOHOL PREP) 70 % PADS, , Disp: , Rfl:    atorvastatin (LIPITOR) 20  MG tablet, TAKE 1 TABLET BY MOUTH ONCE A DAY (Patient taking differently: Take 20 mg by mouth daily.), Disp: 90 tablet, Rfl: 2   AURYXIA 1 GM 210 MG(Fe) tablet, Take 210 mg by mouth every evening., Disp: , Rfl:    calcitRIOL (ROCALTROL) 0.25 MCG capsule, Take 0.25 mcg by mouth daily., Disp: , Rfl:    camphor-menthol (SARNA) lotion, Apply 1 application topically every 8 (eight) hours as needed for itching., Disp: 222 mL, Rfl: 0   Cetirizine HCl 10 MG CAPS, Take 1 capsule by mouth daily. , Disp: , Rfl:    Cholecalciferol (D3-1000 PO), Take 1 tablet by mouth See admin instructions. Take one tablet by mouth on Monday,Wednesday and Fridays, Disp: , Rfl:    ferric citrate (AURYXIA) 1 GM 210 MG(Fe) tablet, Take  1 tablet by mouth 3 times a day with meals as directed, Disp: 270 tablet, Rfl: 3   fluticasone furoate-vilanterol (BREO ELLIPTA) 200-25 MCG/INH AEPB, INHALE 1 PUFF BY MOUTH INTO THE LUNGS AT THE SAME TIME EVERY DAY, Disp: 60 each, Rfl: 2   FREESTYLE LITE test strip, , Disp: , Rfl:    Glucagon, rDNA, (GLUCAGON EMERGENCY) 1 MG KIT, See admin instructions., Disp: , Rfl:    Insulin Glargine (BASAGLAR KWIKPEN) 100 UNIT/ML, Inject 6 units subcutaneously once a day, Disp: 9 mL, Rfl: 6   Insulin Glargine-yfgn (SEMGLEE, YFGN,) 100 UNIT/ML SOPN, Inject 6 units subcutaneously once a day, Disp: 9 mL, Rfl: 4   Insulin Lispro (HUMALOG KWIKPEN [place]), Inject 2-4 Units into the skin 3 (three) times daily before meals. Sliding scale: 150-250 =2 units, 250 -350=3 units, 350 += 4 units, Disp: , Rfl:    lacosamide (VIMPAT) 200 MG TABS tablet, TAKE 1 TABLET TWICE A DAY EXCEPT ON DIALYSIS DAYS M-W-F GIVE 1 TAB IN AM, 1 TAB AFTER DIALYSIS, 1 TAB IN PM., Disp: 90 tablet, Rfl: 5   lamoTRIgine (LAMICTAL) 200 MG tablet, TAKE 1 TABLET BY MOUTH 2 TIMES DAILY, Disp: 60 tablet, Rfl: 3   midodrine (PROAMATINE) 10 MG tablet, TAKE 1 TABLET BY MOUTH 2 TIMES DAILY WITH MEALS, Disp: 60 tablet, Rfl: 0   multivitamin (RENA-VIT) TABS tablet, Take 1 tablet by mouth at bedtime., Disp: , Rfl:    pantoprazole (PROTONIX) 40 MG tablet, TAKE 1 TABLET BY MOUTH ONCE DAILY, Disp: 90 tablet, Rfl: 1   Polyethyl Glycol-Propyl Glycol (SYSTANE OP), Place 1 drop into the left eye daily as needed (For drops)., Disp: , Rfl:    prednisoLONE acetate (PRED FORTE) 1 % ophthalmic suspension, Instill 1 drop Right Eye 3 times a day, Disp: 10 mL, Rfl: 1   UNIFINE PENTIPS 31G X 8 MM MISC, USE AS DIRECTED DAILY AFTER SUPPER, Disp: 100 each, Rfl: 3   clonazePAM (KLONOPIN) 0.5 MG tablet, Take 1 tablet (0.5 mg total) by mouth at bedtime. Also take 64m in morning on Monday, Wednesday, Friday before dialysis, Disp: 60 tablet, Rfl: 0   dexamethasone (DECADRON) 4 MG tablet,  Take 4 mg by mouth daily. (Patient not taking: Reported on 03/07/2021), Disp: , Rfl:    dextrose 50 % solution, Dextrose 50% (Patient not taking: Reported on 03/07/2021), Disp: , Rfl:    Lancets (FREESTYLE) lancets, , Disp: , Rfl:    Allergies  Allergen Reactions   Codeine Swelling   Codeine Rash and Other (See Comments)    Unknown reaction (patient says it was more serious than just a rash, but he can't remember what happened)      Review of Systems  Constitutional: Negative.  Negative for chills, fatigue and fever.  HENT:  Negative for congestion and sinus pain.   Respiratory: Negative.  Negative for cough, shortness of breath and wheezing.   Cardiovascular: Negative.  Negative for chest pain and palpitations.  Gastrointestinal: Negative.  Negative for constipation, diarrhea and nausea.  Musculoskeletal:  Negative for arthralgias and myalgias.  Neurological:  Negative for headaches.  Psychiatric/Behavioral: Negative.         Was recently hospitalized for hallucination.   All other systems reviewed and are negative.   Today's Vitals   03/07/21 1411  BP: 114/68  Pulse: 73  Temp: 98.1 F (36.7 C)  Weight: 186 lb (84.4 kg)  Height: 5' 11"  (1.803 m)   Body mass index is 25.94 kg/m.   Objective:  Physical Exam Constitutional:      Appearance: Normal appearance.  HENT:     Head: Normocephalic and atraumatic.  Cardiovascular:     Rate and Rhythm: Normal rate and regular rhythm.     Pulses: Normal pulses.     Heart sounds: Normal heart sounds. No murmur heard. Pulmonary:     Effort: Pulmonary effort is normal. No respiratory distress.     Breath sounds: Normal breath sounds.  Skin:    General: Skin is warm and dry.     Capillary Refill: Capillary refill takes less than 2 seconds.  Neurological:     Mental Status: He is alert and oriented to person, place, and time.     Motor: Weakness present.     Comments: Right sided weakness.        Assessment And Plan:     1.  Diabetes mellitus with end-stage renal disease (HCC) -Chronic, stable  -Followed by Dr. Chalmers Cater endocrinologist -Discussed with patient the importance diet compliance -On dialysis M,W,F   2. Multiple myeloma not having achieved remission ([place]) -Continue to follow up with oncologist  -Labs are drawn frequently with them.   3. ESRD on hemodialysis [facility])  -He gets dialysis M,W,F  -Labs are done frequently.   4. Spastic hemiparesis (HCC)  -Continue to follow up with a  neurologist  -Right side weakness due to hx of stroke.   Plan: No changes to medication were made today. Patient should continue to follow up with his specialists. Patient was encouraged to ask questions about his treatment plan.   The patient was encouraged to call or send a message through [place] for any questions or concerns.   Follow up: if symptoms persist or do not get better.   Patient was given opportunity to ask questions. Patient verbalized understanding of the plan and was able to repeat key elements of the plan. All questions were answered to their satisfaction.  Raman Jamonte Curfman, DNP   I, Raman Oveta Idris have reviewed all documentation for this visit. The documentation on 03/07/21 for the exam, diagnosis, procedures, and orders are all accurate and complete.    IF YOU HAVE BEEN REFERRED TO A SPECIALIST, IT MAY TAKE 1-2 WEEKS TO SCHEDULE/PROCESS THE REFERRAL. IF YOU HAVE NOT HEARD FROM US/SPECIALIST IN TWO WEEKS, PLEASE GIVE [place] A CALL AT [phone] X [phone].   THE PATIENT IS ENCOURAGED TO PRACTICE SOCIAL DISTANCING DUE TO THE COVID-19 PANDEMIC.

## 2021-03-08 DIAGNOSIS — N186 End stage renal disease: Secondary | ICD-10-CM | POA: Diagnosis not present

## 2021-03-08 DIAGNOSIS — Z992 Dependence on renal dialysis: Secondary | ICD-10-CM | POA: Diagnosis not present

## 2021-03-08 DIAGNOSIS — N2581 Secondary hyperparathyroidism of renal origin: Secondary | ICD-10-CM | POA: Diagnosis not present

## 2021-03-08 DIAGNOSIS — D631 Anemia in chronic kidney disease: Secondary | ICD-10-CM | POA: Diagnosis not present

## 2021-03-08 DIAGNOSIS — E1122 Type 2 diabetes mellitus with diabetic chronic kidney disease: Secondary | ICD-10-CM | POA: Diagnosis not present

## 2021-03-08 DIAGNOSIS — T7840XA Allergy, unspecified, initial encounter: Secondary | ICD-10-CM | POA: Diagnosis not present

## 2021-03-11 DIAGNOSIS — N2581 Secondary hyperparathyroidism of renal origin: Secondary | ICD-10-CM | POA: Diagnosis not present

## 2021-03-11 DIAGNOSIS — N186 End stage renal disease: Secondary | ICD-10-CM | POA: Diagnosis not present

## 2021-03-11 DIAGNOSIS — Z992 Dependence on renal dialysis: Secondary | ICD-10-CM | POA: Diagnosis not present

## 2021-03-11 DIAGNOSIS — D631 Anemia in chronic kidney disease: Secondary | ICD-10-CM | POA: Diagnosis not present

## 2021-03-11 DIAGNOSIS — T7840XA Allergy, unspecified, initial encounter: Secondary | ICD-10-CM | POA: Diagnosis not present

## 2021-03-12 ENCOUNTER — Other Ambulatory Visit (HOSPITAL_COMMUNITY): Payer: Self-pay

## 2021-03-12 ENCOUNTER — Ambulatory Visit (INDEPENDENT_AMBULATORY_CARE_PROVIDER_SITE_OTHER): Payer: Medicare Other | Admitting: Neurology

## 2021-03-12 ENCOUNTER — Encounter: Payer: Self-pay | Admitting: Neurology

## 2021-03-12 ENCOUNTER — Other Ambulatory Visit: Payer: Self-pay

## 2021-03-12 ENCOUNTER — Telehealth: Payer: Self-pay | Admitting: Neurology

## 2021-03-12 VITALS — BP 127/84 | HR 73

## 2021-03-12 DIAGNOSIS — G40209 Localization-related (focal) (partial) symptomatic epilepsy and epileptic syndromes with complex partial seizures, not intractable, without status epilepticus: Secondary | ICD-10-CM | POA: Diagnosis not present

## 2021-03-12 DIAGNOSIS — F01518 Vascular dementia, unspecified severity, with other behavioral disturbance: Secondary | ICD-10-CM

## 2021-03-12 DIAGNOSIS — R440 Auditory hallucinations: Secondary | ICD-10-CM

## 2021-03-12 DIAGNOSIS — F0151 Vascular dementia with behavioral disturbance: Secondary | ICD-10-CM | POA: Diagnosis not present

## 2021-03-12 MED ORDER — LAMOTRIGINE 200 MG PO TABS
ORAL_TABLET | Freq: Two times a day (BID) | ORAL | 11 refills | Status: DC
  Filled 2021-03-12: qty 60, fill #0
  Filled 2021-03-28: qty 60, 30d supply, fill #0
  Filled 2021-04-26: qty 60, 30d supply, fill #1
  Filled 2021-05-16: qty 60, 30d supply, fill #2

## 2021-03-12 MED ORDER — LACOSAMIDE 200 MG PO TABS
ORAL_TABLET | ORAL | 5 refills | Status: DC
  Filled 2021-03-12: qty 90, 30d supply, fill #0
  Filled 2021-04-26: qty 90, 30d supply, fill #1
  Filled 2021-05-29: qty 90, 30d supply, fill #2

## 2021-03-12 MED ORDER — CLONAZEPAM 0.5 MG PO TABS
ORAL_TABLET | ORAL | 5 refills | Status: DC
  Filled 2021-03-12: qty 60, 30d supply, fill #0

## 2021-03-12 MED ORDER — QUETIAPINE FUMARATE 25 MG PO TABS
ORAL_TABLET | ORAL | 6 refills | Status: DC
  Filled 2021-03-12: qty 15, 30d supply, fill #0
  Filled 2021-04-26: qty 15, 30d supply, fill #1

## 2021-03-12 NOTE — Patient Instructions (Signed)
Schedule MRI brain without contrast  2. Please fax bloodwork done in [place], if TSH and B12 were not done, we will order it as well  3. Start Seroquel 9m: take 1/2 tablet every evening  4. See how he feels with Seroquel, if night time clonazepam given, may give after Seroquel  5. Continue Vimpat 2058mtwice a day, Lamictal 20032mwice a day, and clonazepam 1mg67mior to dialysis  6. Recommend joining [facility] [facility] program  7. Follow-up in 3-4 months, call for any changes

## 2021-03-12 NOTE — Telephone Encounter (Signed)
Patient's [name] called and said the pharmacy told her the patient's Klonopin was discontinued.  She said this must have been a mistake as the patient needs the medication during dialysis.  [place]

## 2021-03-12 NOTE — Progress Notes (Signed)
NEUROLOGY FOLLOW UP OFFICE NOTE  Ishan Sanroman [facility] [id number] [birth year]  HISTORY OF PRESENT ILLNESS: I had the pleasure of seeing [facility] in follow-up in the neurology clinic on 03/12/2021.  The patient was last seen 10 months ago for seizures secondary to left MCA stroke in 4920, complicated by right subdural hematoma in 2020. He is again accompanied by his wife who helps supplement the history today. Records and images were personally reviewed where available. His wife continued to report episodes where he had a blank look but was out of it, having a hard time following instructions. He was admitted to the EMU from 12/31/20 to 01/04/21 where typical events were not captured. He did have a seizure with right gaze deviation followed by right head version and body turning to the right followed by left arm jerking, gaze deviation to the left that progressed to a convulsion. EEG showed left frontotemporal origin. There was one episode of confusion with no EEG change seen. He was discharged one home doses of Vimpat 2457m BID and Lamotrigine 208mBID. Clonazepam 0.57m59mhs was added. He gets clonazepam 1mg68mior to HD with no further seizures during HD since 05/2019.   His wife asked to speak to me privately before the visit. She states that he cannot ambulate by himself during the day without falling, but last night he got up at 3am and she found him sitting in the hallway. She did not know how he moved the chair in the bedroom without falling. He could barely walk for his dialysis yesterday. He had [place] another fall with his walker last month when he lost his balance. He had a fall one time when he had an episode at night looking for someone in the laundry and falling. He denies any hallucinations, saying he saw his wife in the washer and there was a stump so he fell. He has been having paranoid delusions thinking his wife is having an affair and their son is covering for  her. He admitted to this today, saying he thinks his wife has a boyfriend who comes to their home every night. Their son lets him out and he goes back into his wife's room. He says he can smell cigar daily and he cannot stand cigars. He states he saw the man one time but does not know his name. His wife also reports he says she is burning incense. He states this man started coming 2 months ago. Delusions and hallucinations are usually at night. His wife reports that when he is drowsy during the day, he would be awake at night even with clonazepam 0.57mg 49m. If he is awake during the day, he would sleep through the night. He denies any headaches, dizziness, vision changes, focal numbness/tingling.     History on Initial Assessment 10/29/2017: This is a pleasant 51 yo33H man with a history of diabetes, sickle-cell thalassemia, DVT and PE off Coumadin due to GI bleed, sleep apnea, MGUS, CKD on dialysis, and strokes in multiple vascular distributions in January 2017 with subsequent seizures. Stroke workup showed left M2 superior branch occlusion, TEE with PFO, hypercoagulation studies negative. He is now on aspirin for secondary stroke prevention. He made significant improvement with right-sided weakness and speech was more fluent. He had a seizure in April 2017 and was started on Vimpat 100mg 31m EEG reported diffuse slowing. Limited MRI brain showed multiple late subacute and chronic infarcts, and repeat imaging in May 2017  showed resolution with no new infarct. He had another seizure while in [place] in September 2017 after dialysis and was found to be anemic with Hgb of 6.8, requiring multiple blood transfusions. Vimpat increased to 131m BID. EEG showed mild intermittent generalized theta slowing. He had another seizure after dialysis in November 2017 and Dilantin 307mdaily was added. His wife did not want him to be on this long term, and was switched to Lamotrigine. He had another seizure in January 2018 a  few days after Dilantin was tapered off. He had another seizure in October 2018 again after dialysis. The last seizure was on 10/19/17 again after dialysis. This was slightly different because he was belligerent and agitated and his wife had to calm him down. He is taking Vimpat 15013mID and Lamotrigine 150m63mD with no side effects. He sometimes has a warning prior to the seizures. With the most recent seizure, he was standing at the kitchen table, recalls his wife picking him up, then coming to on the sofa. Seizures last 5-10 minutes, his wife describes staring off, unresponsive, then trembling/low amplitude shaking. He is tired after, with worsened right-sided weakness. He would be "pretty out of it" the next day. Majority of seizures occur right after dialysis, he would still be sitting in the dialysis unit, but the most recent seizure occurred at home an hour after dialysis. He reports BP is not an issue because his nephrologist has regulated his medications and BP is good most of the time.    He has occasional tremors in both legs, he has a right hand tremor noted in the office today. He noticed memory changes after his stroke, but does not think memory is worse after each seizure. He wonders about his short-term memory, sometimes he has to think which is his right or left side. He denies any olfactory/gustatory hallucinations, deja vu, rising epigastric sensation,myoclonic jerks.    Epilepsy Risk Factors:  Prior strokes in multiple vascular distributions. Otherwise he had a normal birth and early development.  There is no history of febrile convulsions, CNS infections such as meningitis/encephalitis, significant traumatic brain injury, neurosurgical procedures, or family history of seizures.   Diagnostic Data: I personally reviewed MRI brain without contrast done 01/2016 which showed left MCA infarct, right posterior temporal and occipital hemorrhagic infarct, left frontal infarct with remote blood  products, remote hemorrhagic infarct in the more superior right frontal lobe, remote nonhemorrhagic lacunar infarcts in the left cerebellum.   48-hour EEG in Jan 2021 showed occasional left temporal slowing, no epileptiform discharges. Typical episode of confusion and weakness captured did not show epileptiform correlate.   Prior AEDs: Dilantin  PAST MEDICAL HISTORY: Past Medical History:  Diagnosis Date   A-fib (HCC)[place] Anemia    Diabetic retinopathy (HCC)[place] [facility] OU   DM type 2 (diabetes mellitus, type 2) (HCC)[place]/09/2018   [name]ential hypertension [facility]/09/2019   GIB (gastrointestinal bleeding)    Recurrent episodes- 09/2014, 09/2015 and 05/2016   Gout    History of recent blood transfusion 10/27/14   2 Units PRBC's   Hyperkalemia 07/2011   Hypertensive retinopathy    OU   Multiple myeloma (HCC)[place] Pulmonary embolism (HCC)[place]/2012; 09/27/2014   a. Bilat PE 07/2011 - unclear cause, tx with 6 months Coumadin.;    Seizure disorder (HCC)[place]/09/2018   Sepsis (HCC)[place] Sickle cell-thalassemia disease (HCC)[place]  a. Sickle cell trait.   Stroke ([place]) 09/2015   R-MCA, L-MCA, PCA and bilateral cerebellar complicated by DVT/PE   Subdural hematoma ([place]) 05/2019    MEDICATIONS: Current Outpatient Medications on File Prior to Visit  Medication Sig Dispense Refill   acetaminophen (TYLENOL) 325 MG tablet Take 1-2 tablets (325-650 mg total) by mouth every 4 (four) hours as needed for mild pain.     albuterol (PROAIR HFA) 108 (90 Base) MCG/ACT inhaler Inhale two puffs every 4-6 hours if needed for cough or wheeze (Patient taking differently: Inhale 2 puffs into the lungs every 4 (four) hours as needed for wheezing or shortness of breath. Inhale two puffs every 4-6 hours if needed for cough or wheeze) 6.7 g 2   Alcohol Swabs (SM ALCOHOL PREP) 70 % PADS      atorvastatin (LIPITOR) 20 MG tablet TAKE 1 TABLET BY MOUTH ONCE A DAY (Patient taking differently:  Take 20 mg by mouth daily.) 90 tablet 2   AURYXIA 1 GM 210 MG(Fe) tablet Take 210 mg by mouth every evening.     calcitRIOL (ROCALTROL) 0.25 MCG capsule Take 0.25 mcg by mouth daily.     camphor-menthol (SARNA) lotion Apply 1 application topically every 8 (eight) hours as needed for itching. 222 mL 0   Cetirizine HCl 10 MG CAPS Take 1 capsule by mouth daily.      Cholecalciferol (D3-1000 PO) Take 1 tablet by mouth See admin instructions. Take one tablet by mouth on Monday,Wednesday and Fridays     clonazePAM (KLONOPIN) 0.5 MG tablet Take 1 tablet (0.5 mg total) by mouth at bedtime. Also take 41m in morning on Monday, Wednesday, Friday before dialysis 60 tablet 0   dexamethasone (DECADRON) 4 MG tablet Take 4 mg by mouth daily. (Patient not taking: Reported on 03/07/2021)     dextrose 50 % solution Dextrose 50% (Patient not taking: Reported on 03/07/2021)     ferric citrate (AURYXIA) 1 GM 210 MG(Fe) tablet Take 1 tablet by mouth 3 times a day with meals as directed 270 tablet 3   fluticasone furoate-vilanterol (BREO ELLIPTA) 200-25 MCG/INH AEPB INHALE 1 PUFF BY MOUTH INTO THE LUNGS AT THE SAME TIME EVERY DAY 60 each 2   FREESTYLE LITE test strip      Glucagon, rDNA, (GLUCAGON EMERGENCY) 1 MG KIT See admin instructions.     Insulin Glargine (BASAGLAR KWIKPEN) 100 UNIT/ML Inject 6 units subcutaneously once a day 9 mL 6   Insulin Glargine-yfgn (SEMGLEE, YFGN,) 100 UNIT/ML SOPN Inject 6 units subcutaneously once a day 9 mL 4   Insulin Lispro (HUMALOG KWIKPEN [place]) Inject 2-4 Units into the skin 3 (three) times daily before meals. Sliding scale: 150-250 =2 units, 250 -350=3 units, 350 += 4 units     lacosamide (VIMPAT) 200 MG TABS tablet TAKE 1 TABLET TWICE A DAY EXCEPT ON DIALYSIS DAYS M-W-F GIVE 1 TAB IN AM, 1 TAB AFTER DIALYSIS, 1 TAB IN PM. 90 tablet 5   lamoTRIgine (LAMICTAL) 200 MG tablet TAKE 1 TABLET BY MOUTH 2 TIMES DAILY 60 tablet 3   Lancets (FREESTYLE) lancets      midodrine (PROAMATINE) 10 MG  tablet TAKE 1 TABLET BY MOUTH 2 TIMES DAILY WITH MEALS 60 tablet 0   multivitamin (RENA-VIT) TABS tablet Take 1 tablet by mouth at bedtime.     pantoprazole (PROTONIX) 40 MG tablet TAKE 1 TABLET BY MOUTH ONCE DAILY 90 tablet 1   Polyethyl Glycol-Propyl Glycol (SYSTANE OP) Place 1 drop into the left  eye daily as needed (For drops).     prednisoLONE acetate (PRED FORTE) 1 % ophthalmic suspension Instill 1 drop Right Eye 3 times a day 10 mL 1   UNIFINE PENTIPS 31G X 8 MM MISC USE AS DIRECTED DAILY AFTER SUPPER 100 each 3   No current facility-administered medications on file prior to visit.    ALLERGIES: Allergies  Allergen Reactions   Codeine Swelling   Codeine Rash and Other (See Comments)    Unknown reaction (patient says it was more serious than just a rash, but he can't remember what happened)     FAMILY HISTORY: Family History  Problem Relation Age of Onset   Hypertension Mother    Hypertension Father    Stroke Father    Sickle cell anemia Brother    Diabetes Mother    Sickle cell anemia Brother    Diabetes type I Brother    Kidney disease Brother     SOCIAL HISTORY: Social History   Socioeconomic History   Marital status: Married    Spouse name: [name]   Number of children: 1   Years of education: college   Highest education level: Not on file  Occupational History   Occupation: [employer]   Occupation: disability  Tobacco Use   Smoking status: Never   Smokeless tobacco: Never  Vaping Use   Vaping Use: Never used  Substance and Sexual Activity   Alcohol use: Not Currently   Drug use: Never   Sexual activity: Yes    Partners: Female  Other Topics Concern   Not on file  Social History Narrative   ** Merged History Encounter **       Pt lives in 2 story home with his wife and 1 son   Has masters degree in psychology   Currently disabled.   Right handed       Social Determinants of Health   Financial Resource Strain: Low Risk     Difficulty of Paying Living Expenses: Not hard at all  Food Insecurity: No Food Insecurity   Worried About Charity fundraiser in the Last Year: Never true   Ran Out of Food in the Last Year: Never true  Transportation Needs: No Transportation Needs   Lack of Transportation (Medical): No   Lack of Transportation (Non-Medical): No  Physical Activity: Insufficiently Active   Days of Exercise per Week: 4 days   Minutes of Exercise per Session: 30 min  Stress: No Stress Concern Present   Feeling of Stress : Not at all  Social Connections: Not on file  Intimate Partner Violence: Not At Risk   Fear of Current or Ex-Partner: No   Emotionally Abused: No   Physically Abused: No   Sexually Abused: No     PHYSICAL EXAM: Vitals:   03/12/21 0822  BP: 127/84  Pulse: 73  SpO2: 97%   General: No acute distress Head:  Normocephalic/atraumatic Skin/Extremities: No rash, no edema Neurological Exam: alert and oriented to person, place, day of the week.Did not know month, states it is the 11th, Spring, 1924. No aphasia or dysarthria. Fund of knowledge is reduced.  Recent and remote memory are impaired.  Attention and concentration are reduced. MMSE 17/30 MMSE - Mini Mental State Exam 03/12/2021 05/30/2020  Orientation to time 1 3  Orientation to Place 3 5  Registration 3 3  Attention/ Calculation 2 1  Recall 2 3  Language- name 2 objects 2 2  Language- repeat 0 0  Language-  follow 3 step command 3 3  Language- read & follow direction 1 1  Write a sentence 0 0  Copy design 0 0  Total score 17 21   Cranial nerves: Pupils equal, round. Extraocular movements intact with no nystagmus. Visual fields full.  No facial asymmetry.  Motor: Increased tone on right UE. Muscle strength 5/5 on left UE and LE, 4/5 right UE with spastic contracture of fingers on right hand, 4/5 proximal right LE, 5/5 distally. Reflexes brisk +3 on right UE and Le, +2 on left UE and LE. Finger to nose testing intact on left FTN,  ataxic on right due to weakness.  Gait not tested. There is an action tremor on the right hand.   IMPRESSION: This is a pleasant 55 yo RH man with a history of  diabetes, sickle-cell thalassemia, DVT and PE off Coumadin due to GI bleed, sleep apnea, CKD on dialysis, strokes in multiple vascular distributions in January 2017 with subsequent seizures. He had a complicated course while preparing for stem cell transplant for multiple myeloma, found to have a nontraumatic right subdural hematoma in 05/2019. The convulsive seizures occurring during dialysis have not occurred since clonazepam 65m prior to HD was started in 05/2019. His wife continues to report episodes of confusion, vEEG monitoring in 12/2020 did not capture typical confusional episodes, he had provoked GTC arising from the left temporal region as medications were held. He is now having paranoid delusions and hallucinations, MRI brain without contrast will be ordered to assess for underlying structural abnormality. Discussed with his wife that symptoms are more likely due to vascular dementia with behavioral changes. MMSE today 17/30. Recommend starting low dose Seroquel 219m1/2 tab qhs, side effects, including cardiac black box warning were discussed. Continue Vimpat 20064mID, Lamotrigine 200m63mD, clonazepam 1mg 34mor to HD. His wife can hold bedtime clonazepam if he is too drowsy with initiation of Seroquel. Discussed enrolling in day programs to help with sleep wake cycle. Continue close supervision. He does not drive. Follow-up in 3 months, they know to call for any changes.   Thank you for allowing me to participate in his care.  Please do not hesitate to call for any questions or concerns.  KarenEllouise Newer.   CC: Dr. SandeBaird [facility]

## 2021-03-12 NOTE — Telephone Encounter (Signed)
Pt wife called and informed that Dr Delice Lesch did not DC Isaiah [name]'s Klonopin she sent in a refill with 5 refills for him to the pharmacy

## 2021-03-13 ENCOUNTER — Telehealth: Payer: Self-pay | Admitting: Neurology

## 2021-03-13 ENCOUNTER — Other Ambulatory Visit: Payer: Self-pay | Admitting: Neurology

## 2021-03-13 ENCOUNTER — Other Ambulatory Visit (HOSPITAL_COMMUNITY): Payer: Self-pay

## 2021-03-13 DIAGNOSIS — Z992 Dependence on renal dialysis: Secondary | ICD-10-CM | POA: Diagnosis not present

## 2021-03-13 DIAGNOSIS — T7840XA Allergy, unspecified, initial encounter: Secondary | ICD-10-CM | POA: Diagnosis not present

## 2021-03-13 DIAGNOSIS — N186 End stage renal disease: Secondary | ICD-10-CM | POA: Diagnosis not present

## 2021-03-13 DIAGNOSIS — N2581 Secondary hyperparathyroidism of renal origin: Secondary | ICD-10-CM | POA: Diagnosis not present

## 2021-03-13 DIAGNOSIS — D631 Anemia in chronic kidney disease: Secondary | ICD-10-CM | POA: Diagnosis not present

## 2021-03-13 MED ORDER — CLONAZEPAM 0.5 MG PO TABS
ORAL_TABLET | ORAL | 5 refills | Status: DC
  Filled 2021-03-13: qty 60, 5d supply, fill #0
  Filled 2021-04-26: qty 60, 5d supply, fill #1
  Filled 2021-05-31: qty 60, 5d supply, fill #2

## 2021-03-13 NOTE — Telephone Encounter (Signed)
Isaiah [name] wanted to let patel know she is sending labs over that was requested. She would like a call, would like to know she got them [phone]

## 2021-03-13 NOTE — Telephone Encounter (Signed)
Returned call to Mrs. Isaiah [name] and confirmed results were received by fax and forwarded electronically to Dr. Delice Lesch.

## 2021-03-15 ENCOUNTER — Telehealth: Payer: Self-pay | Admitting: Neurology

## 2021-03-15 ENCOUNTER — Other Ambulatory Visit (HOSPITAL_COMMUNITY): Payer: Self-pay

## 2021-03-15 DIAGNOSIS — T7840XA Allergy, unspecified, initial encounter: Secondary | ICD-10-CM | POA: Diagnosis not present

## 2021-03-15 DIAGNOSIS — Z992 Dependence on renal dialysis: Secondary | ICD-10-CM | POA: Diagnosis not present

## 2021-03-15 DIAGNOSIS — N186 End stage renal disease: Secondary | ICD-10-CM | POA: Diagnosis not present

## 2021-03-15 DIAGNOSIS — Z20822 Contact with and (suspected) exposure to covid-19: Secondary | ICD-10-CM | POA: Diagnosis not present

## 2021-03-15 DIAGNOSIS — D631 Anemia in chronic kidney disease: Secondary | ICD-10-CM | POA: Diagnosis not present

## 2021-03-15 DIAGNOSIS — N2581 Secondary hyperparathyroidism of renal origin: Secondary | ICD-10-CM | POA: Diagnosis not present

## 2021-03-15 NOTE — Telephone Encounter (Signed)
Please advice. Thank you.

## 2021-03-15 NOTE — Telephone Encounter (Signed)
Ok to skip dialysis days for seroquel, thanks

## 2021-03-15 NOTE — Telephone Encounter (Signed)
Pt wife said the Seroquel is working, he is sleeping through the night, but the days he has dialysis is rough on her. She was wondering if he could skip the days he has dialysis. she was worried about it being a psych medicine.

## 2021-03-15 NOTE — Telephone Encounter (Signed)
Message left for patient to return my call.  

## 2021-03-17 DIAGNOSIS — E1065 Type 1 diabetes mellitus with hyperglycemia: Secondary | ICD-10-CM | POA: Diagnosis not present

## 2021-03-18 ENCOUNTER — Other Ambulatory Visit: Payer: Self-pay | Admitting: Internal Medicine

## 2021-03-18 ENCOUNTER — Other Ambulatory Visit (HOSPITAL_COMMUNITY): Payer: Self-pay

## 2021-03-18 DIAGNOSIS — D631 Anemia in chronic kidney disease: Secondary | ICD-10-CM | POA: Diagnosis not present

## 2021-03-18 DIAGNOSIS — N2581 Secondary hyperparathyroidism of renal origin: Secondary | ICD-10-CM | POA: Diagnosis not present

## 2021-03-18 DIAGNOSIS — T7840XA Allergy, unspecified, initial encounter: Secondary | ICD-10-CM | POA: Diagnosis not present

## 2021-03-18 DIAGNOSIS — N186 End stage renal disease: Secondary | ICD-10-CM | POA: Diagnosis not present

## 2021-03-18 DIAGNOSIS — Z992 Dependence on renal dialysis: Secondary | ICD-10-CM | POA: Diagnosis not present

## 2021-03-19 ENCOUNTER — Telehealth: Payer: Self-pay | Admitting: Neurology

## 2021-03-19 ENCOUNTER — Other Ambulatory Visit (HOSPITAL_COMMUNITY): Payer: Self-pay

## 2021-03-19 NOTE — Telephone Encounter (Signed)
Pt wife called in to let aquino know his MRI is scheduled for 03/23/21.

## 2021-03-20 ENCOUNTER — Other Ambulatory Visit (HOSPITAL_COMMUNITY): Payer: Self-pay

## 2021-03-20 DIAGNOSIS — N2581 Secondary hyperparathyroidism of renal origin: Secondary | ICD-10-CM | POA: Diagnosis not present

## 2021-03-20 DIAGNOSIS — T7840XA Allergy, unspecified, initial encounter: Secondary | ICD-10-CM | POA: Diagnosis not present

## 2021-03-20 DIAGNOSIS — N186 End stage renal disease: Secondary | ICD-10-CM | POA: Diagnosis not present

## 2021-03-20 DIAGNOSIS — Z992 Dependence on renal dialysis: Secondary | ICD-10-CM | POA: Diagnosis not present

## 2021-03-20 DIAGNOSIS — E1129 Type 2 diabetes mellitus with other diabetic kidney complication: Secondary | ICD-10-CM | POA: Diagnosis not present

## 2021-03-20 DIAGNOSIS — D631 Anemia in chronic kidney disease: Secondary | ICD-10-CM | POA: Diagnosis not present

## 2021-03-20 MED ORDER — MIDODRINE HCL 10 MG PO TABS
ORAL_TABLET | ORAL | 0 refills | Status: DC
  Filled 2021-03-20: qty 60, 30d supply, fill #0

## 2021-03-20 NOTE — Telephone Encounter (Signed)
Patient's wife stated she called on Tuesday 03/19/2021 and was informed the comments below.

## 2021-03-22 DIAGNOSIS — T7840XA Allergy, unspecified, initial encounter: Secondary | ICD-10-CM | POA: Diagnosis not present

## 2021-03-22 DIAGNOSIS — Z992 Dependence on renal dialysis: Secondary | ICD-10-CM | POA: Diagnosis not present

## 2021-03-22 DIAGNOSIS — D631 Anemia in chronic kidney disease: Secondary | ICD-10-CM | POA: Diagnosis not present

## 2021-03-22 DIAGNOSIS — N2581 Secondary hyperparathyroidism of renal origin: Secondary | ICD-10-CM | POA: Diagnosis not present

## 2021-03-22 DIAGNOSIS — N186 End stage renal disease: Secondary | ICD-10-CM | POA: Diagnosis not present

## 2021-03-23 ENCOUNTER — Ambulatory Visit
Admission: RE | Admit: 2021-03-23 | Discharge: 2021-03-23 | Disposition: A | Discharge status: Home / Self Care | Payer: Medicare Other | Source: Ambulatory Visit | Attending: Neurology | Admitting: Neurology

## 2021-03-23 ENCOUNTER — Other Ambulatory Visit: Payer: Self-pay

## 2021-03-23 DIAGNOSIS — G9389 Other specified disorders of brain: Secondary | ICD-10-CM | POA: Diagnosis not present

## 2021-03-23 DIAGNOSIS — F0151 Vascular dementia with behavioral disturbance: Secondary | ICD-10-CM

## 2021-03-23 DIAGNOSIS — R569 Unspecified convulsions: Secondary | ICD-10-CM | POA: Diagnosis not present

## 2021-03-23 DIAGNOSIS — J341 Cyst and mucocele of nose and nasal sinus: Secondary | ICD-10-CM | POA: Diagnosis not present

## 2021-03-23 DIAGNOSIS — F01518 Vascular dementia, unspecified severity, with other behavioral disturbance: Secondary | ICD-10-CM

## 2021-03-23 DIAGNOSIS — R440 Auditory hallucinations: Secondary | ICD-10-CM

## 2021-03-23 DIAGNOSIS — G40209 Localization-related (focal) (partial) symptomatic epilepsy and epileptic syndromes with complex partial seizures, not intractable, without status epilepticus: Secondary | ICD-10-CM

## 2021-03-25 DIAGNOSIS — N2581 Secondary hyperparathyroidism of renal origin: Secondary | ICD-10-CM | POA: Diagnosis not present

## 2021-03-25 DIAGNOSIS — T7840XA Allergy, unspecified, initial encounter: Secondary | ICD-10-CM | POA: Diagnosis not present

## 2021-03-25 DIAGNOSIS — N186 End stage renal disease: Secondary | ICD-10-CM | POA: Diagnosis not present

## 2021-03-25 DIAGNOSIS — D631 Anemia in chronic kidney disease: Secondary | ICD-10-CM | POA: Diagnosis not present

## 2021-03-25 DIAGNOSIS — Z992 Dependence on renal dialysis: Secondary | ICD-10-CM | POA: Diagnosis not present

## 2021-03-26 ENCOUNTER — Ambulatory Visit (INDEPENDENT_AMBULATORY_CARE_PROVIDER_SITE_OTHER): Payer: Medicare Other | Admitting: Ophthalmology

## 2021-03-26 ENCOUNTER — Encounter (INDEPENDENT_AMBULATORY_CARE_PROVIDER_SITE_OTHER): Payer: Self-pay | Admitting: Ophthalmology

## 2021-03-26 ENCOUNTER — Other Ambulatory Visit: Payer: Self-pay

## 2021-03-26 DIAGNOSIS — H3582 Retinal ischemia: Secondary | ICD-10-CM | POA: Diagnosis not present

## 2021-03-26 DIAGNOSIS — I10 Essential (primary) hypertension: Secondary | ICD-10-CM

## 2021-03-26 DIAGNOSIS — H04123 Dry eye syndrome of bilateral lacrimal glands: Secondary | ICD-10-CM

## 2021-03-26 DIAGNOSIS — H3581 Retinal edema: Secondary | ICD-10-CM | POA: Diagnosis not present

## 2021-03-26 DIAGNOSIS — H2101 Hyphema, right eye: Secondary | ICD-10-CM

## 2021-03-26 DIAGNOSIS — Z961 Presence of intraocular lens: Secondary | ICD-10-CM

## 2021-03-26 DIAGNOSIS — H35033 Hypertensive retinopathy, bilateral: Secondary | ICD-10-CM

## 2021-03-26 DIAGNOSIS — H4311 Vitreous hemorrhage, right eye: Secondary | ICD-10-CM

## 2021-03-26 DIAGNOSIS — D571 Sickle-cell disease without crisis: Secondary | ICD-10-CM | POA: Diagnosis not present

## 2021-03-26 DIAGNOSIS — H36 Retinal disorders in diseases classified elsewhere: Secondary | ICD-10-CM | POA: Diagnosis not present

## 2021-03-26 DIAGNOSIS — E113593 Type 2 diabetes mellitus with proliferative diabetic retinopathy without macular edema, bilateral: Secondary | ICD-10-CM

## 2021-03-26 MED ORDER — BEVACIZUMAB CHEMO INJECTION 1.25MG/0.05ML SYRINGE FOR KALEIDOSCOPE
1.2500 mg | INTRAVITREAL | Status: AC | PRN
  Administered 2021-03-26: 1.25 mg via INTRAVITREAL

## 2021-03-26 NOTE — Progress Notes (Signed)
[facility] Note  03/26/2021     CHIEF COMPLAINT Patient presents for Retina Follow Up   HISTORY OF PRESENT ILLNESS: Isaiah [name] is a 55 y.o. male who presents to the clinic today for:   HPI     Retina Follow Up   Patient presents with  Diabetic Retinopathy.  In both eyes.  This started 6 months ago.  I, the attending physician,  performed the HPI with the patient and updated documentation appropriately.        Comments   Patient here for 6 months retina follow up for PDR OU with retinal ischemia OU. Patient states vision doing pretty good when uses drops. No eye pain.       Last edited by [name], MD on 03/26/2021  4:40 PM.    Pt states right eye vision is down, pts wife states pt was having pain in his right eye after dialysis, so pt saw Dr. Herbert Deaner and she put him on PF TID until his next visit with her which is towards the end of the year.   Referring physician: [name], MD [address] [place],  [place] [address]  HISTORICAL INFORMATION:   Selected notes from the MEDICAL RECORD NUMBER Pt of Dr. [name].  Urgent re-referral from Dr. [name] for possible PDR w/NVI OD.  Presented to [facility] on 7.27.21 w/elevated IOPS and OD swelling and irritation.  Started on Brimonidine BID OD and Pred Q1hr OD.   CURRENT MEDICATIONS: Current Outpatient Medications (Ophthalmic Drugs)  Medication Sig   prednisoLONE acetate (PRED FORTE) 1 % ophthalmic suspension Instill 1 drop Right Eye 3 times a day   No current facility-administered medications for this visit. (Ophthalmic Drugs)   Current Outpatient Medications (Other)  Medication Sig   acetaminophen (TYLENOL) 325 MG tablet Take 1-2 tablets (325-650 mg total) by mouth every 4 (four) hours as needed for mild pain.   albuterol (PROAIR HFA) 108 (90 Base) MCG/ACT inhaler Inhale two puffs every 4-6 hours if needed for cough or wheeze (Patient taking differently: Inhale 2  puffs into the lungs every 4 (four) hours as needed for wheezing or shortness of breath. Inhale two puffs every 4-6 hours if needed for cough or wheeze)   Alcohol Swabs (SM ALCOHOL PREP) 70 % PADS    atorvastatin (LIPITOR) 20 MG tablet TAKE 1 TABLET BY MOUTH ONCE A DAY (Patient taking differently: Take 20 mg by mouth daily.)   AURYXIA 1 GM 210 MG(Fe) tablet Take 210 mg by mouth every evening.   calcitRIOL (ROCALTROL) 0.25 MCG capsule Take 0.25 mcg by mouth daily.   camphor-menthol (SARNA) lotion Apply 1 application topically every 8 (eight) hours as needed for itching.   Cetirizine HCl 10 MG CAPS Take 1 capsule by mouth daily.    Cholecalciferol (D3-1000 PO) Take 1 tablet by mouth See admin instructions. Take one tablet by mouth on Monday,Wednesday and Fridays   clonazePAM (KLONOPIN) 0.5 MG tablet Take 1 tablet by mouth at bedtime. Also take 2 tablets in morning on Monday, Wednesday, Friday before dialysis   dexamethasone (DECADRON) 4 MG tablet Take 4 mg by mouth daily. (Patient not taking: No sig reported)   dextrose 50 % solution Dextrose 50% (Patient not taking: No sig reported)   ferric citrate (AURYXIA) 1 GM 210 MG(Fe) tablet Take 1 tablet by mouth 3 times a day with meals as directed   fluticasone furoate-vilanterol (BREO ELLIPTA) 200-25 MCG/INH AEPB INHALE 1 PUFF  BY MOUTH INTO THE LUNGS AT THE SAME TIME EVERY DAY   FREESTYLE LITE test strip    Glucagon, rDNA, (GLUCAGON EMERGENCY) 1 MG KIT See admin instructions.   Insulin Glargine (BASAGLAR KWIKPEN) 100 UNIT/ML Inject 6 units subcutaneously once a day   Insulin Glargine-yfgn (SEMGLEE, YFGN,) 100 UNIT/ML SOPN Inject 6 units subcutaneously once a day   Insulin Lispro (HUMALOG KWIKPEN [place]) Inject 2-4 Units into the skin 3 (three) times daily before meals. Sliding scale: 150-250 =2 units, 250 -350=3 units, 350 += 4 units   lacosamide (VIMPAT) 200 MG TABS tablet TAKE 1 TABLET TWICE A DAY EXCEPT ON DIALYSIS DAYS M-W-F GIVE 1 TAB IN AM, 1 TAB AFTER  DIALYSIS, 1 TAB IN PM.   lamoTRIgine (LAMICTAL) 200 MG tablet TAKE 1 TABLET BY MOUTH 2 TIMES DAILY   Lancets (FREESTYLE) lancets    midodrine (PROAMATINE) 10 MG tablet TAKE 1 TABLET BY MOUTH 2 TIMES DAILY WITH MEALS   multivitamin (RENA-VIT) TABS tablet Take 1 tablet by mouth at bedtime.   pantoprazole (PROTONIX) 40 MG tablet TAKE 1 TABLET BY MOUTH ONCE DAILY   QUEtiapine (SEROQUEL) 25 MG tablet Take 1/2 tablet by mouth every evening   UNIFINE PENTIPS 31G X 8 MM MISC USE AS DIRECTED DAILY AFTER SUPPER   No current facility-administered medications for this visit. (Other)   REVIEW OF SYSTEMS: ROS   Positive for: Gastrointestinal, Neurological, Genitourinary, Endocrine, Cardiovascular, Eyes, Respiratory Negative for: Constitutional, Skin, Musculoskeletal, HENT, Psychiatric, Allergic/Imm, Heme/Lymph Last edited by Theodore Demark, COA on 03/26/2021 12:48 PM.      ALLERGIES Allergies  Allergen Reactions   Codeine Swelling   Codeine Rash and Other (See Comments)    Unknown reaction (patient says it was more serious than just a rash, but he can't remember what happened)    PAST MEDICAL HISTORY Past Medical History:  Diagnosis Date   A-fib ([place])    Anemia    Diabetic retinopathy ([place])    PDR OU   DM type 2 (diabetes mellitus, type 2) ([place]) 06/09/2019   ESRD (end stage renal disease) on dialysis [facility])    Essential hypertension 06/09/2019   GIB (gastrointestinal bleeding)    Recurrent episodes- 09/2014, 09/2015 and 05/2016   Gout    History of recent blood transfusion 10/27/14   2 Units PRBC's   Hyperkalemia 07/2011   Hypertensive retinopathy    OU   Multiple myeloma ([place])    Pulmonary embolism ([place]) 07/2011; 09/27/2014   a. Bilat PE 07/2011 - unclear cause, tx with 6 months Coumadin.;    Seizure disorder ([place]) 06/09/2019   Sepsis ([place])    Sickle cell-thalassemia disease ([place])    a. Sickle cell trait.   Stroke ([place]) 09/2015   R-MCA, L-MCA, PCA and bilateral cerebellar  complicated by DVT/PE   Subdural hematoma ([place]) 05/2019   Past Surgical History:  Procedure Laterality Date   AV FISTULA PLACEMENT Right 12/05/2015   Procedure: INSERTION OF ARTERIOVENOUS (AV) GORE-TEX GRAFT ARM;  Surgeon: Elam Dutch, MD;  Location: [place];  Service: Vascular;  Laterality: Right;   BONE MARROW BIOPSY     CATARACT EXTRACTION Right 09/2019   Dr. Herbert Deaner   CATARACT EXTRACTION Left 10/2019   Dr. Herbert Deaner   CHOLECYSTECTOMY     CHOLECYSTECTOMY  [phone]?   COLONOSCOPY WITH PROPOFOL N/A 01/22/2017   Procedure: COLONOSCOPY WITH PROPOFOL;  Surgeon: Wilford Corner, MD;  Location: [facility];  Service: Endoscopy;  Laterality: N/A;   EYE SURGERY Bilateral  Cat Sx - Dr. Herbert Deaner   INSERTION OF DIALYSIS CATHETER Right 10/16/2015   Procedure: INSERTION OF PALINDROME DIALYSIS CATHETER ;  Surgeon: Elam Dutch, MD;  Location: [place];  Service: Vascular;  Laterality: Right;   OTHER SURGICAL HISTORY     Retinal surgery   PARS PLANA VITRECTOMY  02/17/2012   Procedure: PARS PLANA VITRECTOMY WITH 25 GAUGE;  Surgeon: Hayden Pedro, MD;  Location: [place];  Service: Ophthalmology;  Laterality: Right;   PERIPHERAL VASCULAR CATHETERIZATION N/A 03/20/2016   Procedure: A/V Shuntogram/Fistulagram;  Surgeon: Conrad [place], MD;  Location: [facility];  Service: Cardiovascular;  Laterality: N/A;   TEE WITHOUT CARDIOVERSION N/A 12/02/2016   Procedure: TRANSESOPHAGEAL ECHOCARDIOGRAM (TEE);  Surgeon: Larey Dresser, MD;  [facility] END[facility];  Service: Cardiovascular;  Laterality: N/A;   FAMILY HISTORY Family History  Problem Relation Age of Onset   Hypertension Mother    Hypertension Father    Stroke Father    Sickle cell anemia Brother    Diabetes Mother    Sickle cell anemia Brother    Diabetes type I Brother    Kidney disease Brother    SOCIAL HISTORY Social History   Tobacco Use   Smoking status: Never   Smokeless tobacco: Never  Vaping Use   Vaping Use: Never used   Substance Use Topics   Alcohol use: Not Currently   Drug use: Never         OPHTHALMIC EXAM:  Base Eye Exam     Visual Acuity (Snellen - Linear)       Right Left   Dist cc 20/350 -1 20/100 -2   Dist ph cc NI 20/80 -2    Correction: Glasses         Tonometry (Tonopen, 12:45 PM)       Right Left   Pressure 06 10         Gonioscopy (Goldmann, three mirror)       Right Left   Temporal Grade 4    Nasal Grade 4    Superior Grade 4    Inferior Grade 4   OD: +NVA and NVI, anterior synechiae temporal quad        Pupils       Dark Light Shape React APD   Right 3 2 Round Minimal None   Left 3 2 Round Minimal None         Visual Fields (Counting fingers)       Left Right    Full Full         Extraocular Movement       Right Left    Full, Ortho Full, Ortho         Neuro/Psych     Oriented x3: Yes   Mood/Affect: Normal         Dilation     Both eyes: 1.0% Mydriacyl, 2.5% Phenylephrine @ 12:45 PM           Slit Lamp and Fundus Exam     Slit Lamp Exam       Right Left   Lids/Lashes Dermato Dermato   Conjunctiva/Sclera Melanosis, 1+ Injection Melanosis   Cornea Arcus, well healed cataract wound, 2-3+ PEE 2+ PEE, well healed temporal cataract wounds   Anterior Chamber Deep and quiet Deep and quiet   Iris Round and poorly dilated to 4.61m, +NVI and +NVA; +PAS Round and dilated, No NVI   Lens PC IOL in good position PCIOL   Vitreous  Post vitrectomy.  Residual anterior vitreous syneresis.  +RBC - improved, +pigment Mild asteroid hyalosis, syneresis         Fundus Exam       Right Left   Disc 3+pallor. Sharp rim Hazy view, mild pallor, sharp rim   C/D Ratio 0.7 0.5   Macula Blunted foveal reflex. atrophic; ischemic, no heme Flat, Blunted foveal reflex, No heme or edema   Vessels Attenuated, tortuous, severe midzonal attenuation and ischemia, sclerotic Attenuated, tortuous   Periphery Attached, Peripheral laser scars, good  posterior fill in 360 Attached.  360 PRP           Refraction     Wearing Rx       Sphere Cylinder Axis Add   Right -1.75 +0.50 090 +2.50   Left -1.50 Sphere  +2.50            IMAGING AND PROCEDURES  Imaging and Procedures for 03/26/2021  OCT, Retina - OU - Both Eyes       Right Eye Quality was borderline. Central Foveal Thickness: 248. Progression has worsened. Findings include normal foveal contour, no IRF, no SRF, outer retinal atrophy, epiretinal membrane, inner retinal atrophy, intraretinal hyper-reflective material (Focal hyper reflectivity and trace edema nasal macula; interval progression of diffuse retinal atrophy -- nasal thickening gone).   Left Eye Quality was borderline. Central Foveal Thickness: 241. Progression has been stable. Findings include normal foveal contour, no SRF, epiretinal membrane (Diffuse retinal atrophy/thinning; stable retinoschisis temporal midzone caught on widefield).   Notes *Images captured and stored on drive  Diagnosis / Impression:  OD: retinal ischemia / CRAO -- focal hyper reflectivity and trace edema nasal macula; interval progression of diffuse retinal atrophy -- nasal thickening gone OS: Diffuse retinal atrophy/thinning; retinoschisis temporal midzone caught on widefield -- stable  Clinical management:  See below  Abbreviations: NFP - Normal foveal profile. CME - cystoid macular edema. PED - pigment epithelial detachment. IRF - intraretinal fluid. SRF - subretinal fluid. EZ - ellipsoid zone. ERM - epiretinal membrane. ORA - outer retinal atrophy. ORT - outer retinal tubulation. SRHM - subretinal hyper-reflective material. IRHM - intraretinal hyper-reflective material     Intravitreal Injection, Pharmacologic Agent - OD - Right Eye       Time Out 03/26/2021. 1:45 PM. Confirmed correct patient, procedure, site, and patient consented.   Anesthesia Topical anesthesia was used. Anesthetic medications included Lidocaine 2%,  Proparacaine 0.5%.   Procedure Preparation included 5% betadine to ocular surface, eyelid speculum. A supplied needle was used.   Injection: 1.25 mg Bevacizumab 1.45m/0.05ml   Route: Intravitreal, Site: Right Eye   NDC: 50242-060-01, Lot:: [id number] Expiration date: 05/06/2021, Waste: 0.05 mL   Post-op Post injection exam found visual acuity of at least counting fingers, no retinal detachment. The patient tolerated the procedure well. There were no complications. The patient received written and verbal post procedure care education. Post injection medications were not given.               ASSESSMENT/PLAN:    ICD-10-CM   1. Vitreous hemorrhage, right eye (HCC)  H43.11     2. Hyphema of right eye  H21.01     3. Proliferative diabetic retinopathy of both eyes without macular edema associated with type 2 diabetes mellitus (HCC)  EQ75.9163Intravitreal Injection, Pharmacologic Agent - OD - Right Eye    Bevacizumab (AVASTIN) SOLN 1.25 mg    4. Retinal ischemia  H35.82 Intravitreal Injection, Pharmacologic Agent - OD - Right Eye  Bevacizumab (AVASTIN) SOLN 1.25 mg    5. Retinal edema  H35.81 OCT, Retina - OU - Both Eyes    6. Sickle cell retinopathy without crisis ([place])  D57.1    H36     7. Essential hypertension  I10     8. Hypertensive retinopathy of both eyes  H35.033     9. Pseudophakia of both eyes  Z96.1     10. Bilateral dry eyes  H04.123       1,2. VH & microyphema OD secondary to PDR -- stably resolved  - s/p IVA OD #1 (07.29.21)  - VH and microhyphema stably resolved OD   3-5. Proliferative diabetic retinopathy w/ retinal ischemia, no DME, OU (OD > OS)  - former pt of Dr. [name], last visit 03.04.21  - history of PPV w/ endolaser, FAX OD 6.11.13; PRP OS 2020  - s/p IVA OD #1 (07.29.21) as above for new VH and microhyphema  - s/p PRP OD (08.24.21)  - BCVA OD 20/350 from 20/200; OS stable at 20/80 - OCT without diabetic macular edema, but diffuse atrophy,  both eyes -- OD with RAO-like apprearance  - FA (08.12.21) shows severe vascular perfusion OD -- peripheral nonperfusion extends posteriorly just outside arcades   - exam and gonio shows new NVI and NVA OD -- would benefit from IVA and fill in PRP OD  - recommend IVA OD #2 today, 07.19.22  - pt wishes to proceed with injection - RBA of procedure discussed, questions answered - informed consent obtained and signed - see procedure note - F/u in 1-2 weeks for fill in PRP OD  6. History of Sickle Cell Retinopathy  - 360 PRP in place OU  - no active NV, confirmed on FA 8.12.21  7,8. Hypertensive retinopathy OU - discussed importance of tight BP control - monitor  9. Pseudophakia OU  - s/p CE/IOL OU (OD Jan 2021; OS Feb 2021  - IOL in good position, doing well  - monitor  10. Dry eyes OU - recommend artificial tears and lubricating ointment as needed  Ophthalmic Meds Ordered this visit:  Meds ordered this encounter  Medications   Bevacizumab (AVASTIN) SOLN 1.25 mg       Return in about 2 weeks (around 04/09/2021) for f/u PDR OU, DFE, OCT.  There are no Patient Instructions on file for this visit.  Explained the diagnoses, plan, and follow up with the patient and they expressed understanding.  Patient expressed understanding of the importance of proper follow up care.  This document serves as a record of services personally performed by Gardiner Sleeper, MD, PhD. It was created on their behalf by Estill Bakes, COT an ophthalmic technician. The creation of this record is the provider's dictation and/or activities during the visit.    Electronically signed by: Estill Bakes, COT 7.18.22 @ 4:49 PM   This document serves as a record of services personally performed by Gardiner Sleeper, MD, PhD. It was created on their behalf by San Jetty. Owens Shark, OA an ophthalmic technician. The creation of this record is the provider's dictation and/or activities during the visit.    Electronically  signed by: San Jetty. Marguerita Merles 07.19.2022 4:49 PM   Gardiner Sleeper, M.D., Ph.D. Diseases & Surgery of the Retina and Vitreous [facility]  I have reviewed the above documentation for accuracy and completeness, and I agree with the above. Gardiner Sleeper, M.D., Ph.D. 03/26/21 4:49 PM   Abbreviations: Jerilynn Mages myopia (  nearsighted); A astigmatism; H hyperopia (farsighted); P presbyopia; Mrx spectacle prescription;  CTL contact lenses; OD right eye; OS left eye; OU both eyes  XT exotropia; ET esotropia; PEK punctate epithelial keratitis; PEE punctate epithelial erosions; DES dry eye syndrome; MGD meibomian gland dysfunction; ATs artificial tears; PFAT's preservative free artificial tears; [place] nuclear sclerotic cataract; PSC posterior subcapsular cataract; ERM epi-retinal membrane; PVD posterior vitreous detachment; RD retinal detachment; DM diabetes mellitus; DR diabetic retinopathy; NPDR non-proliferative diabetic retinopathy; PDR proliferative diabetic retinopathy; CSME clinically significant macular edema; DME diabetic macular edema; dbh dot blot hemorrhages; CWS cotton wool spot; POAG primary open angle glaucoma; C/D cup-to-disc ratio; HVF humphrey visual field; GVF goldmann visual field; OCT optical coherence tomography; IOP intraocular pressure; BRVO Branch retinal vein occlusion; CRVO central retinal vein occlusion; CRAO central retinal artery occlusion; BRAO branch retinal artery occlusion; RT retinal tear; SB scleral buckle; PPV pars plana vitrectomy; VH Vitreous hemorrhage; PRP panretinal laser photocoagulation; IVK intravitreal kenalog; VMT vitreomacular traction; MH Macular hole;  NVD neovascularization of the disc; NVE neovascularization elsewhere; AREDS age related eye disease study; ARMD age related macular degeneration; POAG primary open angle glaucoma; EBMD epithelial/anterior basement membrane dystrophy; ACIOL anterior chamber intraocular lens; IOL intraocular lens; PCIOL  posterior chamber intraocular lens; Phaco/IOL phacoemulsification with intraocular lens placement; [place] photorefractive keratectomy; LASIK laser assisted in situ keratomileusis; HTN hypertension; DM diabetes mellitus; COPD chronic obstructive pulmonary disease

## 2021-03-27 DIAGNOSIS — N2581 Secondary hyperparathyroidism of renal origin: Secondary | ICD-10-CM | POA: Diagnosis not present

## 2021-03-27 DIAGNOSIS — T7840XA Allergy, unspecified, initial encounter: Secondary | ICD-10-CM | POA: Diagnosis not present

## 2021-03-27 DIAGNOSIS — N186 End stage renal disease: Secondary | ICD-10-CM | POA: Diagnosis not present

## 2021-03-27 DIAGNOSIS — Z992 Dependence on renal dialysis: Secondary | ICD-10-CM | POA: Diagnosis not present

## 2021-03-27 DIAGNOSIS — D631 Anemia in chronic kidney disease: Secondary | ICD-10-CM | POA: Diagnosis not present

## 2021-03-28 ENCOUNTER — Ambulatory Visit: Payer: Medicare Other | Admitting: Physical Medicine & Rehabilitation

## 2021-03-28 ENCOUNTER — Other Ambulatory Visit (HOSPITAL_COMMUNITY): Payer: Self-pay

## 2021-03-28 ENCOUNTER — Telehealth: Payer: Self-pay | Admitting: Neurology

## 2021-03-28 NOTE — Telephone Encounter (Signed)
Pt's wife called in wanting to get the pt's MRI results

## 2021-03-28 NOTE — Telephone Encounter (Signed)
Spoke with pt wife informed her that Dr Delice Lesch reviewed the brain MRI, there are no new strokes or bleed, however there is significant scarring and injury on both sides of the brain from the prior strokes. No new changes. As we discussed, his symptoms are most likely due to vascular dementia from the multiple strokes on both sides of the brain

## 2021-03-28 NOTE — Telephone Encounter (Signed)
Pls let wife know I reviewed the brain MRI, there are no new strokes or bleed, however there is significant scarring and injury on both sides of the brain from the prior strokes. No new changes. As we discussed, his symptoms are most likely due to vascular dementia from the multiple strokes on both sides of the brain.

## 2021-03-29 DIAGNOSIS — T7840XA Allergy, unspecified, initial encounter: Secondary | ICD-10-CM | POA: Diagnosis not present

## 2021-03-29 DIAGNOSIS — N2581 Secondary hyperparathyroidism of renal origin: Secondary | ICD-10-CM | POA: Diagnosis not present

## 2021-03-29 DIAGNOSIS — N186 End stage renal disease: Secondary | ICD-10-CM | POA: Diagnosis not present

## 2021-03-29 DIAGNOSIS — D631 Anemia in chronic kidney disease: Secondary | ICD-10-CM | POA: Diagnosis not present

## 2021-03-29 DIAGNOSIS — Z992 Dependence on renal dialysis: Secondary | ICD-10-CM | POA: Diagnosis not present

## 2021-04-01 DIAGNOSIS — N186 End stage renal disease: Secondary | ICD-10-CM | POA: Diagnosis not present

## 2021-04-01 DIAGNOSIS — Z992 Dependence on renal dialysis: Secondary | ICD-10-CM | POA: Diagnosis not present

## 2021-04-01 DIAGNOSIS — D631 Anemia in chronic kidney disease: Secondary | ICD-10-CM | POA: Diagnosis not present

## 2021-04-01 DIAGNOSIS — N2581 Secondary hyperparathyroidism of renal origin: Secondary | ICD-10-CM | POA: Diagnosis not present

## 2021-04-01 DIAGNOSIS — T7840XA Allergy, unspecified, initial encounter: Secondary | ICD-10-CM | POA: Diagnosis not present

## 2021-04-03 DIAGNOSIS — N186 End stage renal disease: Secondary | ICD-10-CM | POA: Diagnosis not present

## 2021-04-03 DIAGNOSIS — T7840XA Allergy, unspecified, initial encounter: Secondary | ICD-10-CM | POA: Diagnosis not present

## 2021-04-03 DIAGNOSIS — Z992 Dependence on renal dialysis: Secondary | ICD-10-CM | POA: Diagnosis not present

## 2021-04-03 DIAGNOSIS — N2581 Secondary hyperparathyroidism of renal origin: Secondary | ICD-10-CM | POA: Diagnosis not present

## 2021-04-03 DIAGNOSIS — D631 Anemia in chronic kidney disease: Secondary | ICD-10-CM | POA: Diagnosis not present

## 2021-04-05 DIAGNOSIS — Z992 Dependence on renal dialysis: Secondary | ICD-10-CM | POA: Diagnosis not present

## 2021-04-05 DIAGNOSIS — N186 End stage renal disease: Secondary | ICD-10-CM | POA: Diagnosis not present

## 2021-04-05 DIAGNOSIS — T7840XA Allergy, unspecified, initial encounter: Secondary | ICD-10-CM | POA: Diagnosis not present

## 2021-04-05 DIAGNOSIS — N2581 Secondary hyperparathyroidism of renal origin: Secondary | ICD-10-CM | POA: Diagnosis not present

## 2021-04-05 DIAGNOSIS — D631 Anemia in chronic kidney disease: Secondary | ICD-10-CM | POA: Diagnosis not present

## 2021-04-08 DIAGNOSIS — Z992 Dependence on renal dialysis: Secondary | ICD-10-CM | POA: Diagnosis not present

## 2021-04-08 DIAGNOSIS — E1122 Type 2 diabetes mellitus with diabetic chronic kidney disease: Secondary | ICD-10-CM | POA: Diagnosis not present

## 2021-04-08 DIAGNOSIS — N2581 Secondary hyperparathyroidism of renal origin: Secondary | ICD-10-CM | POA: Diagnosis not present

## 2021-04-08 DIAGNOSIS — N186 End stage renal disease: Secondary | ICD-10-CM | POA: Diagnosis not present

## 2021-04-08 NOTE — Progress Notes (Signed)
[facility] Note  04/09/2021     CHIEF COMPLAINT Patient presents for Retina Follow Up   HISTORY OF PRESENT ILLNESS: Isaiah [name] is a 55 y.o. male who presents to the clinic today for:   HPI     Retina Follow Up   Patient presents with  Diabetic Retinopathy.  In both eyes.  Duration of 2 weeks.  Since onset it is stable.  I, the attending physician,  performed the HPI with the patient and updated documentation appropriately.        Comments   Pt here for 2 wk ret f/u PDR OU. Pt states vision has improved a little. No ocular paint or discomfort. Blood sugar currently is 135.        Last edited by [name], MD on 04/09/2021 12:12 PM.    Here for PRP OD, pt is using PF TID per Dr. Herbert Deaner for pain after dialysis, but he has not had a follow appt with her yet   Referring physician: Monna Fam, MD [place],  [place] [phone]  HISTORICAL INFORMATION:   Selected notes from the MEDICAL RECORD NUMBER Pt of Dr. [name].  Urgent re-referral from Dr. [name] for possible PDR w/NVI OD.  Presented to [name] on 7.27.21 w/elevated IOPS and OD swelling and irritation.  Started on Brimonidine BID OD and Pred Q1hr OD.   CURRENT MEDICATIONS: Current Outpatient Medications (Ophthalmic Drugs)  Medication Sig   prednisoLONE acetate (PRED FORTE) 1 % ophthalmic suspension Instill 1 drop Right Eye 3 times a day   No current facility-administered medications for this visit. (Ophthalmic Drugs)   Current Outpatient Medications (Other)  Medication Sig   acetaminophen (TYLENOL) 325 MG tablet Take 1-2 tablets (325-650 mg total) by mouth every 4 (four) hours as needed for mild pain.   albuterol (PROAIR HFA) 108 (90 Base) MCG/ACT inhaler Inhale two puffs every 4-6 hours if needed for cough or wheeze (Patient taking differently: Inhale 2 puffs into the lungs every 4 (four) hours as needed for wheezing or shortness of breath. Inhale two  puffs every 4-6 hours if needed for cough or wheeze)   Alcohol Swabs (SM ALCOHOL PREP) 70 % PADS    atorvastatin (LIPITOR) 20 MG tablet TAKE 1 TABLET BY MOUTH ONCE A DAY (Patient taking differently: Take 20 mg by mouth daily.)   AURYXIA 1 GM 210 MG(Fe) tablet Take 210 mg by mouth every evening.   calcitRIOL (ROCALTROL) 0.25 MCG capsule Take 0.25 mcg by mouth daily.   camphor-menthol (SARNA) lotion Apply 1 application topically every 8 (eight) hours as needed for itching.   Cetirizine HCl 10 MG CAPS Take 1 capsule by mouth daily.    Cholecalciferol (D3-1000 PO) Take 1 tablet by mouth See admin instructions. Take one tablet by mouth on Monday,Wednesday and Fridays   clonazePAM (KLONOPIN) 0.5 MG tablet Take 1 tablet by mouth at bedtime. Also take 2 tablets in morning on Monday, Wednesday, Friday before dialysis   dexamethasone (DECADRON) 4 MG tablet Take 4 mg by mouth daily. (Patient not taking: No sig reported)   dextrose 50 % solution Dextrose 50% (Patient not taking: No sig reported)   ferric citrate (AURYXIA) 1 GM 210 MG(Fe) tablet Take 1 tablet by mouth 3 times a day with meals as directed   fluticasone furoate-vilanterol (BREO ELLIPTA) 200-25 MCG/INH AEPB INHALE 1 PUFF BY MOUTH INTO THE LUNGS AT THE SAME TIME EVERY DAY   FREESTYLE LITE test  strip    Glucagon, rDNA, (GLUCAGON EMERGENCY) 1 MG KIT See admin instructions.   Insulin Glargine (BASAGLAR KWIKPEN) 100 UNIT/ML Inject 6 units subcutaneously once a day   Insulin Glargine-yfgn (SEMGLEE, YFGN,) 100 UNIT/ML SOPN Inject 6 units subcutaneously once a day   Insulin Lispro (HUMALOG KWIKPEN [place]) Inject 2-4 Units into the skin 3 (three) times daily before meals. Sliding scale: 150-250 =2 units, 250 -350=3 units, 350 += 4 units   lacosamide (VIMPAT) 200 MG TABS tablet TAKE 1 TABLET TWICE A DAY EXCEPT ON DIALYSIS DAYS M-W-F GIVE 1 TAB IN AM, 1 TAB AFTER DIALYSIS, 1 TAB IN PM.   lamoTRIgine (LAMICTAL) 200 MG tablet TAKE 1 TABLET BY MOUTH 2 TIMES DAILY    Lancets (FREESTYLE) lancets    midodrine (PROAMATINE) 10 MG tablet TAKE 1 TABLET BY MOUTH 2 TIMES DAILY WITH MEALS   multivitamin (RENA-VIT) TABS tablet Take 1 tablet by mouth at bedtime.   pantoprazole (PROTONIX) 40 MG tablet TAKE 1 TABLET BY MOUTH ONCE DAILY   QUEtiapine (SEROQUEL) 25 MG tablet Take 1/2 tablet by mouth every evening   UNIFINE PENTIPS 31G X 8 MM MISC USE AS DIRECTED DAILY AFTER SUPPER   No current facility-administered medications for this visit. (Other)   REVIEW OF SYSTEMS: ROS   Positive for: Gastrointestinal, Neurological, Genitourinary, Endocrine, Cardiovascular, Eyes, Respiratory Negative for: Constitutional, Skin, Musculoskeletal, HENT, Psychiatric, Allergic/Imm, Heme/Lymph Last edited by Kingsley Spittle, COT on 04/09/2021  9:06 AM.       ALLERGIES Allergies  Allergen Reactions   Codeine Swelling   Codeine Rash and Other (See Comments)    Unknown reaction (patient says it was more serious than just a rash, but he can't remember what happened)    PAST MEDICAL HISTORY Past Medical History:  Diagnosis Date   A-fib ([place])    Anemia    Diabetic retinopathy ([place])    PDR OU   DM type 2 (diabetes mellitus, type 2) ([place]) 06/09/2019   ESRD (end stage renal disease) on dialysis [facility])    Essential hypertension 06/09/2019   GIB (gastrointestinal bleeding)    Recurrent episodes- 09/2014, 09/2015 and 05/2016   Gout    History of recent blood transfusion 10/27/14   2 Units PRBC's   Hyperkalemia 07/2011   Hypertensive retinopathy    OU   Multiple myeloma ([place])    Pulmonary embolism ([place]) 07/2011; 09/27/2014   a. Bilat PE 07/2011 - unclear cause, tx with 6 months Coumadin.;    Seizure disorder ([place]) 06/09/2019   Sepsis ([place])    Sickle cell-thalassemia disease ([place])    a. Sickle cell trait.   Stroke ([place]) 09/2015   R-MCA, L-MCA, PCA and bilateral cerebellar complicated by DVT/PE   Subdural hematoma ([place]) 05/2019   Past Surgical History:  Procedure  Laterality Date   AV FISTULA PLACEMENT Right 12/05/2015   Procedure: INSERTION OF ARTERIOVENOUS (AV) GORE-TEX GRAFT ARM;  Surgeon: Elam Dutch, MD;  Location: [facility];  Service: Vascular;  Laterality: Right;   BONE MARROW BIOPSY     CATARACT EXTRACTION Right 09/2019   Dr. Herbert Deaner   CATARACT EXTRACTION Left 10/2019   Dr. Herbert Deaner   CHOLECYSTECTOMY     CHOLECYSTECTOMY  [phone]?   COLONOSCOPY WITH PROPOFOL N/A 01/22/2017   Procedure: COLONOSCOPY WITH PROPOFOL;  Surgeon: Wilford Corner, MD;  Location: [facility];  Service: Endoscopy;  Laterality: N/A;   EYE SURGERY Bilateral    Cat Sx - Dr. Herbert Deaner   INSERTION OF DIALYSIS CATHETER Right 10/16/2015  Procedure: INSERTION OF PALINDROME DIALYSIS CATHETER ;  Surgeon: Elam Dutch, MD;  [name];  Service: Vascular;  Laterality: Right;   OTHER SURGICAL HISTORY     Retinal surgery   PARS PLANA VITRECTOMY  02/17/2012   Procedure: PARS PLANA VITRECTOMY WITH 25 GAUGE;  Surgeon: Hayden Pedro, MD;  Location: [facility];  Service: Ophthalmology;  Laterality: Right;   PERIPHERAL VASCULAR CATHETERIZATION N/A 03/20/2016   Procedure: A/V Shuntogram/Fistulagram;  Surgeon: Conrad Camanche [place], MD;  Location: [facility];  Service: Cardiovascular;  Laterality: N/A;   TEE WITHOUT CARDIOVERSION N/A 12/02/2016   Procedure: TRANSESOPHAGEAL ECHOCARDIOGRAM (TEE);  Surgeon: Larey Dresser, MD;  Location: [facility] ENDOSCOPY;  Service: Cardiovascular;  Laterality: N/A;   FAMILY HISTORY Family History  Problem Relation Age of Onset   Hypertension Mother    Hypertension Father    Stroke Father    Sickle cell anemia Brother    Diabetes Mother    Sickle cell anemia Brother    Diabetes type I Brother    Kidney disease Brother    SOCIAL HISTORY Social History   Tobacco Use   Smoking status: Never   Smokeless tobacco: Never  Vaping Use   Vaping Use: Never used  Substance Use Topics   Alcohol use: Not Currently   Drug use: Never         OPHTHALMIC  EXAM:  Base Eye Exam     Visual Acuity (Snellen - Linear)       Right Left   Dist cc CF at 3' 20/70 +1   Dist ph cc NI NI    Correction: Glasses         Tonometry (Tonopen, 9:18 AM)       Right Left   Pressure 21 11         Pupils       Dark Light Shape React APD   Right 3 2 Round Minimal None   Left 3 2 Round Minimal None         Visual Fields (Counting fingers)       Left Right    Full Full         Extraocular Movement       Right Left    Full, Ortho Full, Ortho         Neuro/Psych     Oriented x3: Yes   Mood/Affect: Normal         Dilation     Both eyes: 1.0% Mydriacyl, 2.5% Phenylephrine @ 9:19 AM           Slit Lamp and Fundus Exam     Slit Lamp Exam       Right Left   Lids/Lashes Dermato Dermato   Conjunctiva/Sclera Melanosis, 1+ Injection Melanosis   Cornea Arcus, well healed cataract wound, 2-3+ PEE 2+ PEE, well healed temporal cataract wounds   Anterior Chamber Deep and quiet, 0.5+ cell/pigment Deep and quiet   Iris Round and poorly dilated to 4.67m, +NVI and +NVA - regressed; +PAS Round and dilated, No NVI   Lens PC IOL in good position PCIOL   Vitreous Post vitrectomy.  Residual anterior vitreous syneresis.  +RBC - improved, +pigment Mild asteroid hyalosis, syneresis         Fundus Exam       Right Left   Disc 3-4+pallor. Sharp rim Hazy view, mild pallor, sharp rim   C/D Ratio 0.7 0.5   Macula Blunted foveal reflex. atrophic; ischemic, no heme Flat,  Blunted foveal reflex, No heme or edema   Vessels Attenuated, tortuous, severe midzonal attenuation and ischemia, sclerotic Attenuated, tortuous   Periphery Attached, Peripheral laser scars, good posterior fill in 360 Attached.  360 PRP           Refraction     Wearing Rx       Sphere Cylinder Axis Add   Right -1.75 +0.50 090 +2.50   Left -1.50 Sphere  +2.50            IMAGING AND PROCEDURES  Imaging and Procedures for 04/09/2021  OCT, Retina - OU -  Both Eyes       Right Eye Quality was borderline. Central Foveal Thickness: 237. Progression has been stable. Findings include normal foveal contour, no IRF, no SRF, outer retinal atrophy, epiretinal membrane, inner retinal atrophy, intraretinal hyper-reflective material (Focal hyper reflectivity and trace edema nasal macula; interval progression of diffuse retinal atrophy -- nasal thickening gone).   Left Eye Quality was borderline. Central Foveal Thickness: 252. Progression has been stable. Findings include normal foveal contour, no SRF, epiretinal membrane (Diffuse retinal atrophy/thinning; stable retinoschisis temporal midzone caught on widefield).   Notes *Images captured and stored on drive  Diagnosis / Impression:  OD: retinal ischemia / CRAO -- diffuse retinal atrophy -- no edema OS: Diffuse retinal atrophy/thinning; retinoschisis temporal midzone caught on widefield -- stable  Clinical management:  See below  Abbreviations: NFP - Normal foveal profile. CME - cystoid macular edema. PED - pigment epithelial detachment. IRF - intraretinal fluid. SRF - subretinal fluid. EZ - ellipsoid zone. ERM - epiretinal membrane. ORA - outer retinal atrophy. ORT - outer retinal tubulation. SRHM - subretinal hyper-reflective material. IRHM - intraretinal hyper-reflective material     Panretinal Photocoagulation - OD - Right Eye       LASER PROCEDURE NOTE  Diagnosis:   Proliferative Diabetic Retinopathy, RIGHT EYE  Procedure:  Fill-in pan-retinal photocoagulation using slit lamp laser, RIGHT EYE  Anesthesia:  Topical  Surgeon: [name], MD, PhD   Informed consent obtained, operative eye marked, and time out performed prior to initiation of laser.   Lumenis [id number] slit lamp laser Pattern: 3x3 square Power: 240 mW Duration: 30 msec  Spot size: 200 microns  # spots: 1044 spots 360 fill-in posteriorly--to arcades  Complications: None.  RTC: 3 wks  Patient tolerated the  procedure well and received written and verbal post-procedure care information/education.             ASSESSMENT/PLAN:    ICD-10-CM   1. Vitreous hemorrhage, right eye (HCC)  H43.11     2. Hyphema of right eye  H21.01     3. Proliferative diabetic retinopathy of both eyes without macular edema associated with type 2 diabetes mellitus ([place])  L38.1017 Panretinal Photocoagulation - OD - Right Eye    4. Retinal ischemia  H35.82 Panretinal Photocoagulation - OD - Right Eye    5. Retinal edema  H35.81 OCT, Retina - OU - Both Eyes    6. Sickle cell retinopathy without crisis ([place])  D57.1    H36     7. Essential hypertension  I10     8. Hypertensive retinopathy of both eyes  H35.033     9. Pseudophakia of both eyes  Z96.1     10. Bilateral dry eyes  H04.123       1,2. VH & microyphema OD secondary to PDR -- stably resolved  - s/p IVA OD #1 (07.29.21)  - VH and microhyphema stably  resolved OD   3-5. Proliferative diabetic retinopathy w/ severe retinal ischemia, no DME, OU (OD > OS)  - former pt of Dr. [name], last visit 03.04.21  - history of PPV w/ endolaser, FAX OD 6.11.13; PRP OS 2020  - s/p IVA OD #1 (07.29.21) as above for new VH and microhyphema, #2 (07.19.22)  - s/p PRP OD (08.24.21)  - BCVA OD CF from 20/350; OS improved to 20/70 from 20/80 - OCT without diabetic macular edema, but diffuse atrophy, both eyes -- OD with RAO-like apprearance -- severe ischemia  - FA (08.12.21) shows severe vascular non-perfusion OD -- peripheral nonperfusion extends posteriorly just outside arcades   - exam and gonio showed new NVI and NVA OD on 7.19.22-- today regressed post IVA OD on 7.19.22  - IOP OD 21 today -- likely due to PF use -- will restart brimonidine BID OD  - recommend fill-in PRP OD today, 08.02.22  - pt wishes to proceed with laser - RBA of procedure discussed, questions answered - informed consent obtained and signed - see procedure note - continue PF TID OD per  Dr. Herbert Deaner -- will use it as post-laser drop as well - F/u in 3 weeks, DFE, OCT  6. History of Sickle Cell Retinopathy  - 360 PRP in place OU  - no active NV, confirmed on FA 8.12.21  7,8. Hypertensive retinopathy OU - discussed importance of tight BP control - monitor  9. Pseudophakia OU  - s/p CE/IOL OU (OD Jan 2021; OS Feb 2021  - IOL in good position, doing well  - monitor  10. Dry eyes OU - recommend artificial tears and lubricating ointment as needed  Ophthalmic Meds Ordered this visit:  No orders of the defined types were placed in this encounter.     Return in 3 weeks (on 04/30/2021) for f/u 3 weeks PDR OU, DFE, OCT.  There are no Patient Instructions on file for this visit.  Explained the diagnoses, plan, and follow up with the patient and they expressed understanding.  Patient expressed understanding of the importance of proper follow up care.  This document serves as a record of services personally performed by Gardiner Sleeper, MD, PhD. It was created on their behalf by Estill Bakes, COT an ophthalmic technician. The creation of this record is the provider's dictation and/or activities during the visit.    Electronically signed by: Estill Bakes, COT 8.1.22 @ 12:21 PM   This document serves as a record of services personally performed by Gardiner Sleeper, MD, PhD. It was created on their behalf by San Jetty. Owens Shark, OA an ophthalmic technician. The creation of this record is the provider's dictation and/or activities during the visit.    Electronically signed by: San Jetty. Owens Shark, [place] 08.02.2022 12:21 PM   Gardiner Sleeper, M.D., Ph.D. Diseases & Surgery of the Retina and Vitreous [facility]  I have reviewed the above documentation for accuracy and completeness, and I agree with the above. Gardiner Sleeper, M.D., Ph.D. 04/09/21 12:21 PM  Abbreviations: M myopia (nearsighted); A astigmatism; H hyperopia (farsighted); P presbyopia; Mrx spectacle  prescription;  CTL contact lenses; OD right eye; OS left eye; OU both eyes  XT exotropia; ET esotropia; PEK punctate epithelial keratitis; PEE punctate epithelial erosions; DES dry eye syndrome; MGD meibomian gland dysfunction; ATs artificial tears; PFAT's preservative free artificial tears; [place] nuclear sclerotic cataract; PSC posterior subcapsular cataract; ERM epi-retinal membrane; PVD posterior vitreous detachment; RD retinal detachment; DM diabetes  mellitus; DR diabetic retinopathy; NPDR non-proliferative diabetic retinopathy; PDR proliferative diabetic retinopathy; CSME clinically significant macular edema; DME diabetic macular edema; dbh dot blot hemorrhages; CWS cotton wool spot; POAG primary open angle glaucoma; C/D cup-to-disc ratio; HVF humphrey visual field; GVF goldmann visual field; OCT optical coherence tomography; IOP intraocular pressure; BRVO Branch retinal vein occlusion; CRVO central retinal vein occlusion; CRAO central retinal artery occlusion; BRAO branch retinal artery occlusion; RT retinal tear; SB scleral buckle; PPV pars plana vitrectomy; VH Vitreous hemorrhage; PRP panretinal laser photocoagulation; IVK intravitreal kenalog; VMT vitreomacular traction; MH Macular hole;  NVD neovascularization of the disc; NVE neovascularization elsewhere; AREDS age related eye disease study; ARMD age related macular degeneration; POAG primary open angle glaucoma; EBMD epithelial/anterior basement membrane dystrophy; ACIOL anterior chamber intraocular lens; IOL intraocular lens; PCIOL posterior chamber intraocular lens; Phaco/IOL phacoemulsification with intraocular lens placement; [place] photorefractive keratectomy; LASIK laser assisted in situ keratomileusis; HTN hypertension; DM diabetes mellitus; COPD chronic obstructive pulmonary disease

## 2021-04-09 ENCOUNTER — Encounter (INDEPENDENT_AMBULATORY_CARE_PROVIDER_SITE_OTHER): Payer: Self-pay | Admitting: Ophthalmology

## 2021-04-09 ENCOUNTER — Ambulatory Visit (INDEPENDENT_AMBULATORY_CARE_PROVIDER_SITE_OTHER): Payer: Medicare Other | Admitting: Ophthalmology

## 2021-04-09 ENCOUNTER — Other Ambulatory Visit (INDEPENDENT_AMBULATORY_CARE_PROVIDER_SITE_OTHER): Payer: Self-pay

## 2021-04-09 ENCOUNTER — Other Ambulatory Visit: Payer: Self-pay

## 2021-04-09 ENCOUNTER — Other Ambulatory Visit (HOSPITAL_COMMUNITY): Payer: Self-pay

## 2021-04-09 ENCOUNTER — Ambulatory Visit: Payer: 59 | Admitting: Physician Assistant

## 2021-04-09 DIAGNOSIS — H2101 Hyphema, right eye: Secondary | ICD-10-CM

## 2021-04-09 DIAGNOSIS — Z961 Presence of intraocular lens: Secondary | ICD-10-CM

## 2021-04-09 DIAGNOSIS — H35033 Hypertensive retinopathy, bilateral: Secondary | ICD-10-CM

## 2021-04-09 DIAGNOSIS — H3581 Retinal edema: Secondary | ICD-10-CM

## 2021-04-09 DIAGNOSIS — E113593 Type 2 diabetes mellitus with proliferative diabetic retinopathy without macular edema, bilateral: Secondary | ICD-10-CM | POA: Diagnosis not present

## 2021-04-09 DIAGNOSIS — H04123 Dry eye syndrome of bilateral lacrimal glands: Secondary | ICD-10-CM

## 2021-04-09 DIAGNOSIS — H3582 Retinal ischemia: Secondary | ICD-10-CM

## 2021-04-09 DIAGNOSIS — D571 Sickle-cell disease without crisis: Secondary | ICD-10-CM

## 2021-04-09 DIAGNOSIS — H36 Retinal disorders in diseases classified elsewhere: Secondary | ICD-10-CM | POA: Diagnosis not present

## 2021-04-09 DIAGNOSIS — I10 Essential (primary) hypertension: Secondary | ICD-10-CM | POA: Diagnosis not present

## 2021-04-09 DIAGNOSIS — H4311 Vitreous hemorrhage, right eye: Secondary | ICD-10-CM

## 2021-04-09 MED ORDER — BRIMONIDINE TARTRATE 0.2 % OP SOLN
1.0000 [drp] | Freq: Two times a day (BID) | OPHTHALMIC | 1 refills | Status: DC
  Filled 2021-04-09: qty 5, 50d supply, fill #0

## 2021-04-10 DIAGNOSIS — N2581 Secondary hyperparathyroidism of renal origin: Secondary | ICD-10-CM | POA: Diagnosis not present

## 2021-04-10 DIAGNOSIS — Z992 Dependence on renal dialysis: Secondary | ICD-10-CM | POA: Diagnosis not present

## 2021-04-10 DIAGNOSIS — N186 End stage renal disease: Secondary | ICD-10-CM | POA: Diagnosis not present

## 2021-04-11 DIAGNOSIS — H5201 Hypermetropia, right eye: Secondary | ICD-10-CM | POA: Diagnosis not present

## 2021-04-11 DIAGNOSIS — H5212 Myopia, left eye: Secondary | ICD-10-CM | POA: Diagnosis not present

## 2021-04-11 DIAGNOSIS — H52223 Regular astigmatism, bilateral: Secondary | ICD-10-CM | POA: Diagnosis not present

## 2021-04-11 DIAGNOSIS — I10 Essential (primary) hypertension: Secondary | ICD-10-CM | POA: Diagnosis not present

## 2021-04-11 DIAGNOSIS — Z961 Presence of intraocular lens: Secondary | ICD-10-CM | POA: Diagnosis not present

## 2021-04-11 DIAGNOSIS — H35033 Hypertensive retinopathy, bilateral: Secondary | ICD-10-CM | POA: Diagnosis not present

## 2021-04-11 DIAGNOSIS — H524 Presbyopia: Secondary | ICD-10-CM | POA: Diagnosis not present

## 2021-04-11 DIAGNOSIS — Z9849 Cataract extraction status, unspecified eye: Secondary | ICD-10-CM | POA: Diagnosis not present

## 2021-04-11 LAB — HM DIABETES EYE EXAM

## 2021-04-12 DIAGNOSIS — N2581 Secondary hyperparathyroidism of renal origin: Secondary | ICD-10-CM | POA: Diagnosis not present

## 2021-04-12 DIAGNOSIS — N186 End stage renal disease: Secondary | ICD-10-CM | POA: Diagnosis not present

## 2021-04-12 DIAGNOSIS — Z992 Dependence on renal dialysis: Secondary | ICD-10-CM | POA: Diagnosis not present

## 2021-04-15 DIAGNOSIS — Z992 Dependence on renal dialysis: Secondary | ICD-10-CM | POA: Diagnosis not present

## 2021-04-15 DIAGNOSIS — N186 End stage renal disease: Secondary | ICD-10-CM | POA: Diagnosis not present

## 2021-04-15 DIAGNOSIS — N2581 Secondary hyperparathyroidism of renal origin: Secondary | ICD-10-CM | POA: Diagnosis not present

## 2021-04-16 ENCOUNTER — Encounter: Payer: Self-pay | Admitting: Internal Medicine

## 2021-04-17 DIAGNOSIS — Z992 Dependence on renal dialysis: Secondary | ICD-10-CM | POA: Diagnosis not present

## 2021-04-17 DIAGNOSIS — E1065 Type 1 diabetes mellitus with hyperglycemia: Secondary | ICD-10-CM | POA: Diagnosis not present

## 2021-04-17 DIAGNOSIS — N186 End stage renal disease: Secondary | ICD-10-CM | POA: Diagnosis not present

## 2021-04-17 DIAGNOSIS — N2581 Secondary hyperparathyroidism of renal origin: Secondary | ICD-10-CM | POA: Diagnosis not present

## 2021-04-18 ENCOUNTER — Ambulatory Visit: Payer: 59 | Admitting: Internal Medicine

## 2021-04-18 ENCOUNTER — Other Ambulatory Visit: Payer: Self-pay

## 2021-04-18 ENCOUNTER — Ambulatory Visit (INDEPENDENT_AMBULATORY_CARE_PROVIDER_SITE_OTHER): Payer: Medicare Other

## 2021-04-18 ENCOUNTER — Encounter: Payer: Medicare Other | Attending: Physical Medicine & Rehabilitation | Admitting: Physical Medicine & Rehabilitation

## 2021-04-18 ENCOUNTER — Encounter: Payer: Self-pay | Admitting: Physical Medicine & Rehabilitation

## 2021-04-18 VITALS — Ht 71.0 in | Wt 185.0 lb

## 2021-04-18 VITALS — BP 154/93 | HR 74 | Temp 98.6°F | Ht 71.0 in | Wt 185.0 lb

## 2021-04-18 DIAGNOSIS — R269 Unspecified abnormalities of gait and mobility: Secondary | ICD-10-CM

## 2021-04-18 DIAGNOSIS — Z23 Encounter for immunization: Secondary | ICD-10-CM

## 2021-04-18 DIAGNOSIS — S065X9A Traumatic subdural hemorrhage with loss of consciousness of unspecified duration, initial encounter: Secondary | ICD-10-CM | POA: Diagnosis not present

## 2021-04-18 DIAGNOSIS — S065XAA Traumatic subdural hemorrhage with loss of consciousness status unknown, initial encounter: Secondary | ICD-10-CM

## 2021-04-18 DIAGNOSIS — G40909 Epilepsy, unspecified, not intractable, without status epilepticus: Secondary | ICD-10-CM | POA: Diagnosis not present

## 2021-04-18 DIAGNOSIS — Z0000 Encounter for general adult medical examination without abnormal findings: Secondary | ICD-10-CM | POA: Diagnosis not present

## 2021-04-18 MED ORDER — PREVNAR 20 0.5 ML IM SUSY: 0.5000 mL | PREFILLED_SYRINGE | INTRAMUSCULAR | 0 refills | Status: AC

## 2021-04-18 NOTE — Progress Notes (Signed)
I connected with [facility] today by telephone and verified that I am speaking with the correct person using two identifiers. Location patient: home Location provider: work Persons participating in the virtual visit: [place], [facility] (wife), Glenna Durand LPN.   I discussed the limitations, risks, security and privacy concerns of performing an evaluation and management service by telephone and the availability of in person appointments. I also discussed with the patient that there may be a patient responsible charge related to this service. The patient expressed understanding and verbally consented to this telephonic visit.    Interactive audio and video telecommunications were attempted between this provider and patient, however failed, due to patient having technical difficulties OR patient did not have access to video capability.  We continued and completed visit with audio only.     Vital signs may be patient reported or missing.  Subjective:   Isaiah [name] is a 55 y.o. male who presents for Medicare Annual/Subsequent preventive examination.  Review of Systems     Cardiac Risk Factors include: advanced age (>67mn, >>71women);diabetes mellitus;male gender     Objective:    Today's Vitals   04/18/21 1445  Weight: 185 lb (83.9 kg)  Height: 5' 11"  (1.803 m)   Body mass index is 25.8 kg/m.  Advanced Directives 04/18/2021 03/12/2021 12/31/2020 06/14/2020 05/29/2020 04/12/2020 03/29/2020  Does Patient Have a Medical Advance Directive? Yes Yes Yes Yes Yes Yes Yes  Type of AParamedicof [place]Living will [place] of facility DNR (pink MOST or yellow form);Living will HLeotiLiving will HF[place]leyLiving will - HEssex JunctionLiving will HFingervilleLiving will  Does patient want to make changes to medical advance directive? - - No - Patient declined - - - No -  Patient declined  Copy of HDistrict [place]in Chart? No - copy requested - - - - No - copy requested -  Would patient like information on creating a medical advance directive? - - - - - - -  Pre-existing out of facility DNR order (yellow form or pink MOST form) - - - - - - -    Current Medications (verified) Outpatient Encounter Medications as of 04/18/2021  Medication Sig   acetaminophen (TYLENOL) 325 MG tablet Take 1-2 tablets (325-650 mg total) by mouth every 4 (four) hours as needed for mild pain.   albuterol (PROAIR HFA) 108 (90 Base) MCG/ACT inhaler Inhale two puffs every 4-6 hours if needed for cough or wheeze (Patient taking differently: Inhale 2 puffs into the lungs every 4 (four) hours as needed for wheezing or shortness of breath. Inhale two puffs every 4-6 hours if needed for cough or wheeze)   Alcohol Swabs (SM ALCOHOL PREP) 70 % PADS    atorvastatin (LIPITOR) 20 MG tablet TAKE 1 TABLET BY MOUTH ONCE A DAY (Patient taking differently: Take 20 mg by mouth daily.)   AURYXIA 1 GM 210 MG(Fe) tablet Take 210 mg by mouth every evening.   brimonidine (ALPHAGAN) 0.2 % ophthalmic solution Place 1 drop into the right eye in the morning and at bedtime.   calcitRIOL (ROCALTROL) 0.25 MCG capsule Take 0.25 mcg by mouth daily.   camphor-menthol (SARNA) lotion Apply 1 application topically every 8 (eight) hours as needed for itching.   Cetirizine HCl 10 MG CAPS Take 1 capsule by mouth daily.    Cholecalciferol (D3-1000 PO) Take 1 tablet by mouth See admin instructions. Take one  tablet by mouth on Monday,Wednesday and Fridays   clonazePAM (KLONOPIN) 0.5 MG tablet Take 1 tablet by mouth at bedtime. Also take 2 tablets in morning on Monday, Wednesday, Friday before dialysis   dexamethasone (DECADRON) 4 MG tablet Take 4 mg by mouth daily.   dextrose 50 % solution Dextrose 50%   ferric citrate (AURYXIA) 1 GM 210 MG(Fe) tablet Take 1 tablet by mouth 3 times a day with meals as directed    fluticasone furoate-vilanterol (BREO ELLIPTA) 200-25 MCG/INH AEPB INHALE 1 PUFF BY MOUTH INTO THE LUNGS AT THE SAME TIME EVERY DAY   FREESTYLE LITE test strip    Glucagon, rDNA, (GLUCAGON EMERGENCY) 1 MG KIT See admin instructions.   Insulin Glargine (BASAGLAR KWIKPEN) 100 UNIT/ML Inject 6 units subcutaneously once a day   Insulin Glargine-yfgn (SEMGLEE, YFGN,) 100 UNIT/ML SOPN Inject 6 units subcutaneously once a day   Insulin Lispro (HUMALOG KWIKPEN [name]) Inject 2-4 Units into the skin 3 (three) times daily before meals. Sliding scale: 150-250 =2 units, 250 -350=3 units, 350 += 4 units   lacosamide (VIMPAT) 200 MG TABS tablet TAKE 1 TABLET TWICE A DAY EXCEPT ON DIALYSIS DAYS M-W-F GIVE 1 TAB IN AM, 1 TAB AFTER DIALYSIS, 1 TAB IN PM.   lamoTRIgine (LAMICTAL) 200 MG tablet TAKE 1 TABLET BY MOUTH 2 TIMES DAILY   Lancets (FREESTYLE) lancets    midodrine (PROAMATINE) 10 MG tablet TAKE 1 TABLET BY MOUTH 2 TIMES DAILY WITH MEALS   multivitamin (RENA-VIT) TABS tablet Take 1 tablet by mouth at bedtime.   pantoprazole (PROTONIX) 40 MG tablet TAKE 1 TABLET BY MOUTH ONCE DAILY   prednisoLONE acetate (PRED FORTE) 1 % ophthalmic suspension Instill 1 drop Right Eye 3 times a day   QUEtiapine (SEROQUEL) 25 MG tablet Take 1/2 tablet by mouth every evening   UNIFINE PENTIPS 31G X 8 MM MISC USE AS DIRECTED DAILY AFTER SUPPER   No facility-administered encounter medications on file as of 04/18/2021.    Allergies (verified) Codeine and Codeine   History: Past Medical History:  Diagnosis Date   A-fib ([place])    Anemia    Diabetic retinopathy ([place])    PDR OU   DM type 2 (diabetes mellitus, type 2) ([place]) 06/09/2019   ESRD (end stage renal disease) on dialysis [name])    Essential hypertension 06/09/2019   GIB (gastrointestinal bleeding)    Recurrent episodes- 09/2014, 09/2015 and 05/2016   Gout    History of recent blood transfusion 10/27/14   2 Units PRBC's   Hyperkalemia 07/2011   Hypertensive retinopathy     OU   Multiple myeloma ([place])    Pulmonary embolism ([place]) 07/2011; 09/27/2014   a. Bilat PE 07/2011 - unclear cause, tx with 6 months Coumadin.;    Seizure disorder ([place]) 06/09/2019   Sepsis ([place])    Sickle cell-thalassemia disease ([place])    a. Sickle cell trait.   Stroke ([place]) 09/2015   R-MCA, L-MCA, PCA and bilateral cerebellar complicated by DVT/PE   Subdural hematoma ([place]) 05/2019   Past Surgical History:  Procedure Laterality Date   AV FISTULA PLACEMENT Right 12/05/2015   Procedure: INSERTION OF ARTERIOVENOUS (AV) GORE-TEX GRAFT ARM;  Surgeon: Elam Dutch, MD;  Location: [place];  Service: Vascular;  Laterality: Right;   BONE MARROW BIOPSY     CATARACT EXTRACTION Right 09/2019   Dr. Herbert Deaner   CATARACT EXTRACTION Left 10/2019   Dr. Herbert Deaner   CHOLECYSTECTOMY     CHOLECYSTECTOMY  [id number]?  COLONOSCOPY WITH PROPOFOL N/A 01/22/2017   Procedure: COLONOSCOPY WITH PROPOFOL;  Surgeon: Wilford Corner, MD;  Location: [facility] ENDOSCOPY;  Service: Endoscopy;  Laterality: N/A;   EYE SURGERY Bilateral    Cat Sx - Dr. Herbert Deaner   INSERTION OF DIALYSIS CATHETER Right 10/16/2015   Procedure: INSERTION OF PALINDROME DIALYSIS CATHETER ;  Surgeon: Elam Dutch, MD;  Location: [place];  Service: Vascular;  Laterality: Right;   OTHER SURGICAL HISTORY     Retinal surgery   PARS PLANA VITRECTOMY  02/17/2012   Procedure: PARS PLANA VITRECTOMY WITH 25 GAUGE;  Surgeon: Hayden Pedro, MD;  Location: [place];  Service: Ophthalmology;  Laterality: Right;   PERIPHERAL VASCULAR CATHETERIZATION N/A 03/20/2016   Procedure: A/V Shuntogram/Fistulagram;  Surgeon: Conrad , MD;  Location: [place] CV LAB;  Service: Cardiovascular;  Laterality: N/A;   TEE WITHOUT CARDIOVERSION N/A 12/02/2016   Procedure: TRANSESOPHAGEAL ECHOCARDIOGRAM (TEE);  Surgeon: Larey Dresser, MD;  Location: [facility] ENDOSCOPY;  Service: Cardiovascular;  Laterality: N/A;   Family History  Problem Relation Age of Onset   Hypertension Mother     Hypertension Father    Stroke Father    Sickle cell anemia Brother    Diabetes Mother    Sickle cell anemia Brother    Diabetes type I Brother    Kidney disease Brother    Social History   Socioeconomic History   Marital status: Married    Spouse name: [name]   Number of children: 1   Years of education: college   Highest education level: Not on file  Occupational History   Occupation: [employer]   Occupation: disability  Tobacco Use   Smoking status: Never   Smokeless tobacco: Never  Vaping Use   Vaping Use: Never used  Substance and Sexual Activity   Alcohol use: Not Currently   Drug use: Never   Sexual activity: Yes    Partners: Female  Other Topics Concern   Not on file  Social History Narrative   ** Merged History Encounter **       Pt lives in 2 story home with his wife and 1 son   Has masters degree in psychology   Currently disabled.   Right handed       Social Determinants of Health   Financial Resource Strain: Low Risk    Difficulty of Paying Living Expenses: Not hard at all  Food Insecurity: No Food Insecurity   Worried About Charity fundraiser in the Last Year: Never true   Ran Out of Food in the Last Year: Never true  Transportation Needs: No Transportation Needs   Lack of Transportation (Medical): No   Lack of Transportation (Non-Medical): No  Physical Activity: Sufficiently Active   Days of Exercise per Week: 3 days   Minutes of Exercise per Session: 60 min  Stress: No Stress Concern Present   Feeling of Stress : Only a little  Social Connections: Not on file    Tobacco Counseling Counseling given: Not Answered   Clinical Intake:  Pre-visit preparation completed: Yes  Pain : No/denies pain     Nutritional Status: BMI 25 -29 Overweight Nutritional Risks: None Diabetes: Yes  How often do you need to have someone help you when you read instructions, pamphlets, or other written materials from your doctor or pharmacy?:  1 - Never What is the last grade level you completed in school?: masters degree  Diabetic? Yes Nutrition Risk Assessment:  Has the patient had  any N/V/D within the last 2 months?  No  Does the patient have any non-healing wounds?  No  Has the patient had any unintentional weight loss or weight gain?  No   Diabetes:  Is the patient diabetic?  Yes  If diabetic, was a CBG obtained today?  No  Did the patient bring in their glucometer from home?  No  How often do you monitor your CBG's? 3-4 daily.   Financial Strains and Diabetes Management:  Are you having any financial strains with the device, your supplies or your medication? No .  Does the patient want to be seen by Chronic Care Management for management of their diabetes?  No  Would the patient like to be referred to a Nutritionist or for Diabetic Management?  No   Diabetic Exams:  Diabetic Eye Exam: Completed 04/11/2021 Diabetic Foot Exam: Completed 07/19/2020   Interpreter Needed?: No  Information entered by :: NAllen LPN   Activities of Daily Living In your present state of health, do you have any difficulty performing the following activities: 04/18/2021 12/31/2020  Hearing? N N  Vision? Y Y  Comment a little blurry -  Difficulty concentrating or making decisions? N N  Walking or climbing stairs? Y Y  Dressing or bathing? N Y  Doing errands, shopping? Tempie Donning  Preparing Food and eating ? Y -  Using the Toilet? N -  In the past six months, have you accidently leaked urine? N -  Do you have problems with loss of bowel control? N -  Managing your Medications? Y -  Managing your Finances? Y -  Housekeeping or managing your Housekeeping? Y -  Some recent data might be hidden    Patient Care Team: [name], MD as PCP - General (Internal Medicine) Sherren Mocha, MD as PCP - Cardiology (Cardiology) Estanislado Emms, MD (Inactive) as Consulting Physician (Nephrology) Marica Otter, [place] (Optometry) Hayden Pedro,  MD as Consulting Physician (Ophthalmology) [name], MD (Internal Medicine) Cameron Sprang, MD as Consulting Physician (Neurology) Rex Kras, Claudette Stapler, RN as [place] Management [place], Tillie Rung as Social Worker  Indicate any recent [place] you may have received from other than [facility] providers in the past year (date may be approximate).     Assessment:   This is a routine wellness examination for [place].  Hearing/Vision screen Vision Screening - Comments:: Regular eye exams, Dr. Sabra Heck  Dietary issues and exercise activities discussed: Current Exercise Habits: Home exercise routine, Type of exercise: Other - see comments (pedaling), Time (Minutes): 60, Frequency (Times/Week): 3, Weekly Exercise (Minutes/Week): 180   Goals Addressed             This Visit's Progress    Patient Stated       04/18/2021, wants to get off walker and be able to ride a bike       Depression Screen PHQ 2/9 Scores 04/18/2021 09/27/2020 04/12/2020 03/08/2020 09/22/2019 09/13/2019 03/17/2019  PHQ - 2 Score 0 0 0 0 0 0 0  PHQ- 9 Score - - - - - - 1    Fall Risk Fall Risk  04/18/2021 04/18/2021 03/12/2021 09/27/2020 06/26/2020  Falls in the past year? 1 1 1  0 0  Comment loses balance - - Falls in Dec & Oct of 2021 -  Number falls in past yr: 1 1 1  0 -  Comment - - - - -  Injury with Fall? 0 0 0 1 -  Risk for fall due  to : History of fall(s);Impaired balance/gait;Impaired mobility;Medication side effect - - - -  Follow up Falls evaluation completed;Education provided;Falls prevention discussed - - - -    FALL RISK PREVENTION PERTAINING TO THE HOME:  Any stairs in or around the home? Yes  If so, are there any without handrails? No  Home free of loose throw rugs in walkways, pet beds, electrical cords, etc? Yes  Adequate lighting in your home to reduce risk of falls? Yes   ASSISTIVE DEVICES UTILIZED TO PREVENT FALLS:  Life alert? No  Use of a cane, walker or w/c? Yes  Grab bars  in the bathroom? Yes  Shower chair or bench in shower? Yes  Elevated toilet seat or a handicapped toilet? Yes   TIMED UP AND GO:  Was the test performed? No .      Cognitive Function: MMSE - Mini Mental State Exam 03/12/2021 05/30/2020  Orientation to time 1 3  Orientation to Place 3 5  Registration 3 3  Attention/ Calculation 2 1  Recall 2 3  Language- name 2 objects 2 2  Language- repeat 0 0  Language- follow 3 step command 3 3  Language- read & follow direction 1 1  Write a sentence 0 0  Copy design 0 0  Total score 17 21     6CIT Screen 04/18/2021 08/27/2020 04/12/2020 03/17/2019  What Year? 0 points 4 points 0 points 0 points  What month? 0 points 3 points 3 points 0 points  What time? 0 points 0 points 0 points 0 points  Count back from 20 0 points 2 points 0 points 0 points  Months in reverse 4 points - 4 points 4 points  Repeat phrase 4 points - 6 points 2 points  Total Score 8 - 13 6    Immunizations Immunization History  Administered Date(s) Administered   Hepatitis B, adult 11/16/2015, 12/24/2015, 01/21/2016, 05/23/2016, 10/21/2017   Influenza Split 10/13/2015   Influenza, Quadrivalent, Recombinant, Inj, Pf 07/15/2019   Influenza,inj,Quad PF,6+ Mos 10/13/2015, 07/15/2019, 07/18/2020   Influenza,inj,quad, With Preservative 06/07/2018   Influenza-Unspecified 07/09/2014, 06/08/2018   Moderna Sars-Covid-2 Vaccination 11/25/2019, 12/23/2019   PFIZER(Purple Top)SARS-COV-2 Vaccination 05/11/2020   Pneumococcal Conjugate-13 12/14/2015    TDAP status: Due, Education has been provided regarding the importance of this vaccine. Advised may receive this vaccine at local pharmacy or Health Dept. Aware to provide a copy of the vaccination record if obtained from local pharmacy or Health Dept. Verbalized acceptance and understanding.  Flu Vaccine status: Up to date  Pneumococcal vaccine status: Due, Education has been provided regarding the importance of this vaccine.  Advised may receive this vaccine at local pharmacy or Health Dept. Aware to provide a copy of the vaccination record if obtained from local pharmacy or Health Dept. Verbalized acceptance and understanding.  Covid-19 vaccine status: Completed vaccines  Qualifies for Shingles Vaccine? Yes   Zostavax completed No   Shingrix Completed?: No.    Education has been provided regarding the importance of this vaccine. Patient has been advised to call insurance company to determine out of pocket expense if they have not yet received this vaccine. Advised may also receive vaccine at local pharmacy or Health Dept. Verbalized acceptance and understanding.  Screening Tests Health Maintenance  Topic Date Due   PNEUMOCOCCAL POLYSACCHARIDE VACCINE AGE 30-64 HIGH RISK  Never done   Hepatitis C Screening  Never done   TETANUS/TDAP  Never done   Zoster Vaccines- Shingrix (1 of 2) Never done  Pneumococcal Vaccine 82-33 Years old (2 - PPSV23 or PCV20) 12/13/2016   COVID-19 Vaccine (4 - Booster) 08/10/2020   INFLUENZA VACCINE  04/08/2021   FOOT EXAM  07/19/2021   HEMOGLOBIN A1C  08/11/2021   OPHTHALMOLOGY EXAM  04/11/2022   COLONOSCOPY (Pts 45-9yr Insurance coverage will need to be confirmed)  01/23/2027   HIV Screening  Completed   HPV VACCINES  Aged Out   URINE MICROALBUMIN  Discontinued    Health Maintenance  Health Maintenance Due  Topic Date Due   PNEUMOCOCCAL POLYSACCHARIDE VACCINE AGE 98-64 HIGH RISK  Never done   Hepatitis C Screening  Never done   TETANUS/TDAP  Never done   Zoster Vaccines- Shingrix (1 of 2) Never done   Pneumococcal Vaccine 027620Years old (2 - PPSV23 or PCV20) 12/13/2016   COVID-19 Vaccine (4 - Booster) 08/10/2020   INFLUENZA VACCINE  04/08/2021    Colorectal cancer screening: Type of screening: Colonoscopy. Completed 01/22/2017. Repeat every 5 years  Lung Cancer Screening: (Low Dose CT Chest recommended if Age 57-80years, 30 pack-year currently smoking OR have quit w/in  15years.) does not qualify.   Lung Cancer Screening Referral: no  Additional Screening:  Hepatitis C Screening: does qualify;   Vision Screening: Recommended annual ophthalmology exams for early detection of glaucoma and other disorders of the eye. Is the patient up to date with their annual eye exam?  Yes  Who is the provider or what is the name of the office in which the patient attends annual eye exams? Dr. MSabra HeckIf pt is not established with a provider, would they like to be referred to a provider to establish care? No .   Dental Screening: Recommended annual dental exams for proper oral hygiene  Community Resource Referral / Chronic Care Management: CRR required this visit?  No   CCM required this visit?  No      Plan:     I have personally reviewed and noted the following in the patient's chart:   Medical and social history Use of alcohol, tobacco or illicit drugs  Current medications and supplements including opioid prescriptions. Patient is not currently taking opioid prescriptions. Functional ability and status Nutritional status Physical activity Advanced directives List of other physicians Hospitalizations, surgeries, and ER visits in previous 12 months Vitals Screenings to include cognitive, depression, and falls Referrals and appointments  In addition, I have reviewed and discussed with patient certain preventive protocols, quality metrics, and best practice recommendations. A written personalized care plan for preventive services as well as general preventive health recommendations were provided to patient.     NKellie Simmering LPN   [id number]  Nurse Notes:

## 2021-04-18 NOTE — Progress Notes (Signed)
 Subjective:    Patient ID: Isaiah [name], male    DOB: 02/25/1966, 55 y.o.   MRN: [id number]  HPI Male with history of T2DM, A fib, ESRD-HD MWF, CVA, DVT/PE s/p IVC filter, seizure disorder, MM with recent admission to [facility] for stem cell transplant but procedure aborted due to findings of proteus mirabilis bacteremia, aspiration PNA, acute on chronic SDH, findings of bilateral renal calculi as well as breakthrough seizures presents for follow up for SDH.   Wife supplements history. Last clinic visit on 09/27/20.  Since that time, pt states he occasionally does HEP.  He continues to see Neurology. Denies breakthrough seizures. He was diagnosed with vascular dementia recently. He continues to use walker.  He has had 2 falls earlier this year.   Pain Inventory Average Pain 0 Pain Right Now 0 My pain is  no pain  LOCATION OF PAIN  No pain.   BOWEL Number of stools per week: 1-2 Oral laxative use No  Type of laxative  Enema or suppository use No  History of colostomy No  Incontinent No   BLADDER Dialysis In and out cath, frequency  Able to self cath No  Bladder incontinence No  Frequent urination No  Leakage with coughing No  Difficulty starting stream No  Incomplete bladder emptying No    Mobility walk with assistance use a walker ability to climb steps?  yes do you drive?  no use a wheelchair needs help with transfers  Function disabled: date disabled 09/09/2015 I need assistance with the following:  feeding, dressing, bathing and toileting  Neuro/Psych weakness trouble walking  Prior Studies Any changes since last visit?  no  Physicians involved in your care Any changes since last visit?  no   Family History  Problem Relation Age of Onset   Hypertension Mother    Hypertension Father    Stroke Father    Sickle cell anemia Brother    Diabetes Mother    Sickle cell anemia Brother    Diabetes type I Brother    Kidney disease Brother    Social History    Socioeconomic History   Marital status: Married    Spouse name: Sylvia   Number of children: 1   Years of education: college   Highest education level: Not on file  Occupational History   Occupation: [employer]   Occupation: disability  Tobacco Use   Smoking status: Never   Smokeless tobacco: Never  Vaping Use   Vaping Use: Never used  Substance and Sexual Activity   Alcohol use: Not Currently   Drug use: Never   Sexual activity: Yes    Partners: Female  Other Topics Concern   Not on file  Social History Narrative   ** Merged History Encounter **       Pt lives in 2 story home with his wife and 1 son   Has masters degree in psychology   Currently disabled.   Right handed       Social Determinants of Health   Financial Resource Strain: Not on file  Food Insecurity: No Food Insecurity   Worried About Running Out of Food in the Last Year: Never true   Ran Out of Food in the Last Year: Never true  Transportation Needs: No Transportation Needs   Lack of Transportation (Medical): No   Lack of Transportation (Non-Medical): No  Physical Activity: Not on file  Stress: Not on file  Social Connections: Not on file   Past   Surgical History:  Procedure Laterality Date   AV FISTULA PLACEMENT Right 12/05/2015   Procedure: INSERTION OF ARTERIOVENOUS (AV) GORE-TEX GRAFT ARM;  Surgeon: Elam Dutch, MD;  Location: [place];  Service: Vascular;  Laterality: Right;   BONE MARROW BIOPSY     CATARACT EXTRACTION Right 09/2019   Dr. Herbert Deaner   CATARACT EXTRACTION Left 10/2019   Dr. Herbert Deaner   CHOLECYSTECTOMY     CHOLECYSTECTOMY  [phone]?   COLONOSCOPY WITH PROPOFOL N/A 01/22/2017   Procedure: COLONOSCOPY WITH PROPOFOL;  Surgeon: Wilford Corner, MD;  Location: [facility];  Service: Endoscopy;  Laterality: N/A;   EYE SURGERY Bilateral    Cat Sx - Dr. Herbert Deaner   INSERTION OF DIALYSIS CATHETER Right 10/16/2015   Procedure: INSERTION OF PALINDROME DIALYSIS CATHETER ;   Surgeon: Elam Dutch, MD;  Location: [place];  Service: Vascular;  Laterality: Right;   OTHER SURGICAL HISTORY     Retinal surgery   PARS PLANA VITRECTOMY  02/17/2012   Procedure: PARS PLANA VITRECTOMY WITH 25 GAUGE;  Surgeon: Hayden Pedro, MD;  Location: [place];  Service: Ophthalmology;  Laterality: Right;   PERIPHERAL VASCULAR CATHETERIZATION N/A 03/20/2016   Procedure: A/V Shuntogram/Fistulagram;  Surgeon: Conrad Aullville, MD;  Location: [place] [facility];  Service: Cardiovascular;  Laterality: N/A;   TEE WITHOUT CARDIOVERSION N/A 12/02/2016   Procedure: TRANSESOPHAGEAL ECHOCARDIOGRAM (TEE);  Surgeon: Larey Dresser, MD;  Location[facility] ENDOSCOPY;  Service: Cardiovascular;  Laterality: N/A;   Past Medical History:  Diagnosis Date   A-fib ([place])    Anemia    Diabetic retinopathy ([place])    PDR OU   DM type 2 (diabetes mellitus, type 2) ([place]) 06/09/2019   ESRD (end stage renal disease) on dialysis [facility])    Essential hypertension 06/09/2019   GIB (gastrointestinal bleeding)    Recurrent episodes- 09/2014, 09/2015 and 05/2016   Gout    History of recent blood transfusion 10/27/14   2 Units PRBC's   Hyperkalemia 07/2011   Hypertensive retinopathy    OU   Multiple myeloma ([place])    Pulmonary embolism ([place]) 07/2011; 09/27/2014   a. Bilat PE 07/2011 - unclear cause, tx with 6 months Coumadin.;    Seizure disorder ([place]) 06/09/2019   Sepsis ([place])    Sickle cell-thalassemia disease ([place])    a. Sickle cell trait.   Stroke ([place]) 09/2015   R-MCA, L-MCA, PCA and bilateral cerebellar complicated by DVT/PE   Subdural hematoma ([facility]) 05/2019   BP (!) 154/93   Pulse 74   Temp 98.6 F (37 C)   Wt 185 lb (83.9 kg)   SpO2 95%   BMI 25.80 kg/m   Opioid Risk Score:   Fall Risk Score:  `1  Depression screen PHQ 2/9  Depression screen [facility] 2/9 09/27/2020 04/12/2020 03/08/2020  Decreased Interest 0 0 0  Down, Depressed, Hopeless 0 0 0  PHQ - 2 Score 0 0 0  Some recent data might be hidden    Review  of Systems  Constitutional: Negative.   HENT: Negative.    Eyes: Negative.   Respiratory: Negative.    Cardiovascular: Negative.   Gastrointestinal: Negative.   Endocrine:       High blood sugar   Genitourinary: Negative.   Musculoskeletal:  Positive for arthralgias, gait problem and myalgias.  Allergic/Immunologic: Negative.   Neurological:  Positive for seizures, speech difficulty and weakness.  Hematological: Negative.   Psychiatric/Behavioral: Negative.    All other systems reviewed and are negative.  Objective:   Physical Exam  Constitutional: No distress . Vital signs reviewed. HENT: Normocephalic.  Atraumatic. Eyes: EOMI. No discharge. Cardiovascular: No JVD.  Respiratory: Normal effort.  No stridor.   GI: Non-distended.   Skin: Warm and dry.  Intact. Psych: Normal mood.  Normal behavior. Musc: No edema in extremities.  No tenderness in extremities. Gait: Not assessed, walker not present Neurologic:  Alert HOH Dysarthria, stable Motor: RUE: 4+/5 RLE: 4+/5    Assessment & Plan:  Male with history of T2DM, A fib, ESRD-HD MWF, CVA, DVT/PE s/p IVC filter, seizure disorder, MM with recent admission to [facility] for stem cell transplant but procedure aborted due to findings of proteus mirabilis bacteremia, aspiration PNA, acute on chronic SDH, findings of bilateral renal calculi as well as breakthrough seizures presents for follow up for SDH.   1. Impaired ADLs/mobility and function secondary to acute on chronic SDH with 5 mm of midline shift to left- with R hemiparesis and dysarthria             Encouraged HEP  Continue follow up with Neurology  Completed OT  Would like to continue to hold off on more therapies at present   2. Seizure disorder:              Continue follow up with Neurology  Stable   3. Spasticity in setting of 2017 CVA with previous R hemiparesis             ?Resolved   4. Gait abnormality with falls             Continue walker for safety,               Continue HEP  Wheelchair/transport chair ordered 05/2020  Completed PT  Would like to continue to hold on revisiting therapies at present  5. Vascular dementia  Diagnosed recently per patient  Wife would like to return in 1 year

## 2021-04-18 NOTE — Patient Instructions (Signed)
Isaiah [name] , Thank you for taking time to come for your Medicare Wellness Visit. I appreciate your ongoing commitment to your health goals. Please review the following plan we discussed and let me know if I can assist you in the future.   Screening recommendations/referrals: Colonoscopy: completed 01/22/2017 Recommended yearly ophthalmology/optometry visit for glaucoma screening and checkup Recommended yearly dental visit for hygiene and checkup  Vaccinations: Influenza vaccine: due Pneumococcal vaccine: sent to pharmacy Tdap vaccine: due Shingles vaccine: discussed   Covid-19:  05/11/2020, 12/23/2019, 11/25/2019  Advanced directives: Please bring a copy of your POA (Power of Attorney) and/or Living Will to your next appointment.   Conditions/risks identified: none  Next appointment: Follow up in one year for your annual wellness visit   Preventive Care 40-64 Years, Male Preventive care refers to lifestyle choices and visits with your health care provider that can promote health and wellness. What does preventive care include? A yearly physical exam. This is also called an annual well check. Dental exams once or twice a year. Routine eye exams. Ask your health care provider how often you should have your eyes checked. Personal lifestyle choices, including: Daily care of your teeth and gums. Regular physical activity. Eating a healthy diet. Avoiding tobacco and drug use. Limiting alcohol use. Practicing safe sex. Taking low-dose aspirin every day starting at age 73. What happens during an annual well check? The services and screenings done by your health care provider during your annual well check will depend on your age, overall health, lifestyle risk factors, and family history of disease. Counseling  Your health care provider may ask you questions about your: Alcohol use. Tobacco use. Drug use. Emotional well-being. Home and relationship well-being. Sexual activity. Eating  habits. Work and work Statistician. Screening  You may have the following tests or measurements: Height, weight, and BMI. Blood pressure. Lipid and cholesterol levels. These may be checked every 5 years, or more frequently if you are over 40 years old. Skin check. Lung cancer screening. You may have this screening every year starting at age 23 if you have a 30-pack-year history of smoking and currently smoke or have quit within the past 15 years. Fecal occult blood test (FOBT) of the stool. You may have this test every year starting at age 21. Flexible sigmoidoscopy or colonoscopy. You may have a sigmoidoscopy every 5 years or a colonoscopy every 10 years starting at age 72. Prostate cancer screening. Recommendations will vary depending on your family history and other risks. Hepatitis C blood test. Hepatitis B blood test. Sexually transmitted disease (STD) testing. Diabetes screening. This is done by checking your blood sugar (glucose) after you have not eaten for a while (fasting). You may have this done every 1-3 years. Discuss your test results, treatment options, and if necessary, the need for more tests with your health care provider. Vaccines  Your health care provider may recommend certain vaccines, such as: Influenza vaccine. This is recommended every year. Tetanus, diphtheria, and acellular pertussis (Tdap, Td) vaccine. You may need a Td booster every 10 years. Zoster vaccine. You may need this after age 3. Pneumococcal 13-valent conjugate (PCV13) vaccine. You may need this if you have certain conditions and have not been vaccinated. Pneumococcal polysaccharide (PPSV23) vaccine. You may need one or two doses if you smoke cigarettes or if you have certain conditions. Talk to your health care provider about which screenings and vaccines you need and how often you need them. This information is not intended to replace  advice given to you by your health care provider. Make sure you  discuss any questions you have with your health care provider. Document Released: 09/21/2015 Document Revised: 05/14/2016 Document Reviewed: 06/26/2015 Elsevier Interactive Patient Education  2017 [place] Prevention in the Home Falls can cause injuries. They can happen to people of all ages. There are many things you can do to make your home safe and to help prevent falls. What can I do on the outside of my home? Regularly fix the edges of walkways and driveways and fix any cracks. Remove anything that might make you trip as you walk through a door, such as a raised step or threshold. Trim any bushes or trees on the path to your home. Use bright outdoor lighting. Clear any walking paths of anything that might make someone trip, such as rocks or tools. Regularly check to see if handrails are loose or broken. Make sure that both sides of any steps have handrails. Any raised decks and porches should have guardrails on the edges. Have any leaves, snow, or ice cleared regularly. Use sand or salt on walking paths during winter. Clean up any spills in your garage right away. This includes oil or grease spills. What can I do in the bathroom? Use night lights. Install grab bars by the toilet and in the tub and shower. Do not use towel bars as grab bars. Use non-skid mats or decals in the tub or shower. If you need to sit down in the shower, use a plastic, non-slip stool. Keep the floor dry. Clean up any water that spills on the floor as soon as it happens. Remove soap buildup in the tub or shower regularly. Attach bath mats securely with double-sided non-slip rug tape. Do not have throw rugs and other things on the floor that can make you trip. What can I do in the bedroom? Use night lights. Make sure that you have a light by your bed that is easy to reach. Do not use any sheets or blankets that are too big for your bed. They should not hang down onto the floor. Have a firm chair  that has side arms. You can use this for support while you get dressed. Do not have throw rugs and other things on the floor that can make you trip. What can I do in the kitchen? Clean up any spills right away. Avoid walking on wet floors. Keep items that you use a lot in easy-to-reach places. If you need to reach something above you, use a strong step stool that has a grab bar. Keep electrical cords out of the way. Do not use floor polish or wax that makes floors slippery. If you must use wax, use non-skid floor wax. Do not have throw rugs and other things on the floor that can make you trip. What can I do with my stairs? Do not leave any items on the stairs. Make sure that there are handrails on both sides of the stairs and use them. Fix handrails that are broken or loose. Make sure that handrails are as long as the stairways. Check any carpeting to make sure that it is firmly attached to the stairs. Fix any carpet that is loose or worn. Avoid having throw rugs at the top or bottom of the stairs. If you do have throw rugs, attach them to the floor with carpet tape. Make sure that you have a light switch at the top of the stairs and the bottom  of the stairs. If you do not have them, ask someone to add them for you. What else can I do to help prevent falls? Wear shoes that: Do not have high heels. Have rubber bottoms. Are comfortable and fit you well. Are closed at the toe. Do not wear sandals. If you use a stepladder: Make sure that it is fully opened. Do not climb a closed stepladder. Make sure that both sides of the stepladder are locked into place. Ask someone to hold it for you, if possible. Clearly mark and make sure that you can see: Any grab bars or handrails. First and last steps. Where the edge of each step is. Use tools that help you move around (mobility aids) if they are needed. These include: Canes. Walkers. Scooters. Crutches. Turn on the lights when you go into a  dark area. Replace any light bulbs as soon as they burn out. Set up your furniture so you have a clear path. Avoid moving your furniture around. If any of your floors are uneven, fix them. If there are any pets around you, be aware of where they are. Review your medicines with your doctor. Some medicines can make you feel dizzy. This can increase your chance of falling. Ask your doctor what other things that you can do to help prevent falls. This information is not intended to replace advice given to you by your health care provider. Make sure you discuss any questions you have with your health care provider. Document Released: 06/21/2009 Document Revised: 01/31/2016 Document Reviewed: 09/29/2014 Elsevier Interactive Patient Education  2017 [employer].

## 2021-04-19 DIAGNOSIS — N186 End stage renal disease: Secondary | ICD-10-CM | POA: Diagnosis not present

## 2021-04-19 DIAGNOSIS — N2581 Secondary hyperparathyroidism of renal origin: Secondary | ICD-10-CM | POA: Diagnosis not present

## 2021-04-19 DIAGNOSIS — Z992 Dependence on renal dialysis: Secondary | ICD-10-CM | POA: Diagnosis not present

## 2021-04-22 ENCOUNTER — Telehealth: Payer: Self-pay

## 2021-04-22 ENCOUNTER — Telehealth: Payer: Medicare Other

## 2021-04-22 DIAGNOSIS — N186 End stage renal disease: Secondary | ICD-10-CM | POA: Diagnosis not present

## 2021-04-22 DIAGNOSIS — N2581 Secondary hyperparathyroidism of renal origin: Secondary | ICD-10-CM | POA: Diagnosis not present

## 2021-04-22 DIAGNOSIS — Z992 Dependence on renal dialysis: Secondary | ICD-10-CM | POA: Diagnosis not present

## 2021-04-22 NOTE — Telephone Encounter (Signed)
  Care Management   Follow Up Note   04/22/2021 Name: Isaiah [name] [facility] MRN: [id number] DOB: 05-10-1966   Referred by: [name], MD Reason for referral : Chronic Care Management (RN CM Follow up call )   An unsuccessful telephone outreach was attempted today. The patient was referred to the case management team for assistance with care management and care coordination.   Follow Up Plan: A HIPPA compliant phone message was left for the patient providing contact information and requesting a return call.   Barb Merino, RN, BSN, CCM Care Management Coordinator [facility]  Direct Phone: [phone]

## 2021-04-24 DIAGNOSIS — N186 End stage renal disease: Secondary | ICD-10-CM | POA: Diagnosis not present

## 2021-04-24 DIAGNOSIS — Z992 Dependence on renal dialysis: Secondary | ICD-10-CM | POA: Diagnosis not present

## 2021-04-24 DIAGNOSIS — N2581 Secondary hyperparathyroidism of renal origin: Secondary | ICD-10-CM | POA: Diagnosis not present

## 2021-04-26 ENCOUNTER — Other Ambulatory Visit (HOSPITAL_COMMUNITY): Payer: Self-pay

## 2021-04-26 ENCOUNTER — Other Ambulatory Visit: Payer: Self-pay | Admitting: Internal Medicine

## 2021-04-26 DIAGNOSIS — Z992 Dependence on renal dialysis: Secondary | ICD-10-CM | POA: Diagnosis not present

## 2021-04-26 DIAGNOSIS — N2581 Secondary hyperparathyroidism of renal origin: Secondary | ICD-10-CM | POA: Diagnosis not present

## 2021-04-26 DIAGNOSIS — N186 End stage renal disease: Secondary | ICD-10-CM | POA: Diagnosis not present

## 2021-04-26 MED ORDER — MIDODRINE HCL 10 MG PO TABS
ORAL_TABLET | ORAL | 0 refills | Status: DC
  Filled 2021-04-26: qty 60, 30d supply, fill #0

## 2021-04-26 MED ORDER — ATORVASTATIN CALCIUM 20 MG PO TABS
ORAL_TABLET | Freq: Every day | ORAL | 2 refills | Status: DC
  Filled 2021-04-26: qty 90, 90d supply, fill #0
  Filled 2021-07-14: qty 90, 90d supply, fill #1
  Filled 2021-11-27: qty 90, 90d supply, fill #2

## 2021-04-26 NOTE — Progress Notes (Signed)
[facility] Note  04/30/2021     CHIEF COMPLAINT Patient presents for Retina Follow Up   HISTORY OF PRESENT ILLNESS: Isaiah [name] is a 55 y.o. male who presents to the clinic today for:   HPI     Retina Follow Up   Patient presents with  Other.  In right eye.  Duration of 3 weeks.  Since onset it is gradually improving.  I, the attending physician,  performed the HPI with the patient and updated documentation appropriately.        Comments   3 week follow up PDR OU- Hemorrhage has improved OD.  He is only seeing a little of it now esp when he lays on his right side. Still using Prednisolone TID OD and Brimonidine BID OD.  BS 124 this morning  A1C 5.7      Last edited by [name], MD on 04/30/2021 12:41 PM.    Pt states the day after he had laser, he had dialysis and noticed eye pain, he states it resolved on its own    Referring physician: Monna Fam, MD [place],   [id number]  HISTORICAL INFORMATION:   Selected notes from the MEDICAL RECORD NUMBER Pt of Dr. [name].  Urgent re-referral from Dr. [name] for possible PDR w/NVI OD.  Presented to [facility] on 7.27.21 w/elevated IOPS and OD swelling and irritation.  Started on Brimonidine BID OD and Pred Q1hr OD.   CURRENT MEDICATIONS: Current Outpatient Medications (Ophthalmic Drugs)  Medication Sig   brimonidine (ALPHAGAN) 0.2 % ophthalmic solution Place 1 drop into the right eye in the morning and at bedtime.   prednisoLONE acetate (PRED FORTE) 1 % ophthalmic suspension Instill 1 drop Right Eye 3 times a day   No current facility-administered medications for this visit. (Ophthalmic Drugs)   Current Outpatient Medications (Other)  Medication Sig   acetaminophen (TYLENOL) 325 MG tablet Take 1-2 tablets (325-650 mg total) by mouth every 4 (four) hours as needed for mild pain.   albuterol (PROAIR HFA) 108 (90 Base) MCG/ACT inhaler Inhale two puffs  every 4-6 hours if needed for cough or wheeze (Patient taking differently: Inhale 2 puffs into the lungs every 4 (four) hours as needed for wheezing or shortness of breath. Inhale two puffs every 4-6 hours if needed for cough or wheeze)   atorvastatin (LIPITOR) 20 MG tablet TAKE 1 TABLET BY MOUTH ONCE A DAY   AURYXIA 1 GM 210 MG(Fe) tablet Take 210 mg by mouth every evening.   calcitRIOL (ROCALTROL) 0.25 MCG capsule Take 0.25 mcg by mouth daily.   Cetirizine HCl 10 MG CAPS Take 1 capsule by mouth daily.    Cholecalciferol (D3-1000 PO) Take 1 tablet by mouth See admin instructions. Take one tablet by mouth on Monday,Wednesday and Fridays   clonazePAM (KLONOPIN) 0.5 MG tablet Take 1 tablet by mouth at bedtime. Also take 2 tablets in morning on Monday, Wednesday, Friday before dialysis   dexamethasone (DECADRON) 4 MG tablet Take 4 mg by mouth daily.   dextrose 50 % solution Dextrose 50%   ferric citrate (AURYXIA) 1 GM 210 MG(Fe) tablet Take 1 tablet by mouth 3 times a day with meals as directed   fluticasone furoate-vilanterol (BREO ELLIPTA) 200-25 MCG/INH AEPB INHALE 1 PUFF BY MOUTH INTO THE LUNGS AT THE SAME TIME EVERY DAY   Glucagon, rDNA, (GLUCAGON EMERGENCY) 1 MG KIT See admin instructions.   Insulin Glargine-yfgn (SEMGLEE, YFGN,) 100 UNIT/ML  SOPN Inject 6 units subcutaneously once a day   Insulin Lispro (HUMALOG KWIKPEN [place]) Inject 2-4 Units into the skin 3 (three) times daily before meals. Sliding scale: 150-250 =2 units, 250 -350=3 units, 350 += 4 units   lacosamide (VIMPAT) 200 MG TABS tablet TAKE 1 TABLET TWICE A DAY EXCEPT ON DIALYSIS DAYS M-W-F GIVE 1 TAB IN AM, 1 TAB AFTER DIALYSIS, 1 TAB IN PM.   lamoTRIgine (LAMICTAL) 200 MG tablet TAKE 1 TABLET BY MOUTH 2 TIMES DAILY   midodrine (PROAMATINE) 10 MG tablet TAKE 1 TABLET BY MOUTH 2 TIMES DAILY WITH MEALS   multivitamin (RENA-VIT) TABS tablet Take 1 tablet by mouth at bedtime.   pantoprazole (PROTONIX) 40 MG tablet TAKE 1 TABLET BY MOUTH  ONCE DAILY   QUEtiapine (SEROQUEL) 25 MG tablet Take 1/2 tablet by mouth every evening   UNIFINE PENTIPS 31G X 8 MM MISC USE AS DIRECTED DAILY AFTER SUPPER   Alcohol Swabs (SM ALCOHOL PREP) 70 % PADS    camphor-menthol (SARNA) lotion Apply 1 application topically every 8 (eight) hours as needed for itching.   FREESTYLE LITE test strip    Insulin Glargine (BASAGLAR KWIKPEN) 100 UNIT/ML Inject 6 units subcutaneously once a day   Lancets (FREESTYLE) lancets    No current facility-administered medications for this visit. (Other)   REVIEW OF SYSTEMS: ROS   Positive for: Gastrointestinal, Neurological, Genitourinary, Endocrine, Cardiovascular, Eyes, Respiratory Negative for: Constitutional, Skin, Musculoskeletal, HENT, Psychiatric, Allergic/Imm, Heme/Lymph Last edited by Leonie Douglas, COA on 04/30/2021  9:11 AM.        ALLERGIES Allergies  Allergen Reactions   Codeine Swelling   Codeine Rash and Other (See Comments)    Unknown reaction (patient says it was more serious than just a rash, but he can't remember what happened)    PAST MEDICAL HISTORY Past Medical History:  Diagnosis Date   A-fib ([place])    Anemia    Diabetic retinopathy ([place])    PDR OU   DM type 2 (diabetes mellitus, type 2) ([place]) 06/09/2019   ESRD (end stage renal disease) on dialysis [facility])    Essential hypertension 06/09/2019   GIB (gastrointestinal bleeding)    Recurrent episodes- 09/2014, 09/2015 and 05/2016   Gout    History of recent blood transfusion 10/27/14   2 Units PRBC's   Hyperkalemia 07/2011   Hypertensive retinopathy    OU   Multiple myeloma ([place])    Pulmonary embolism ([place]) 07/2011; 09/27/2014   a. Bilat PE 07/2011 - unclear cause, tx with 6 months Coumadin.;    Seizure disorder ([place]) 06/09/2019   Sepsis ([place])    Sickle cell-thalassemia disease ([place])    a. Sickle cell trait.   Stroke ([place]) 09/2015   R-MCA, L-MCA, PCA and bilateral cerebellar complicated by DVT/PE   Subdural hematoma ([place])  05/2019   Past Surgical History:  Procedure Laterality Date   AV FISTULA PLACEMENT Right 12/05/2015   Procedure: INSERTION OF ARTERIOVENOUS (AV) GORE-TEX GRAFT ARM;  Surgeon: Elam Dutch, MD;  Location: [facility];  Service: Vascular;  Laterality: Right;   BONE MARROW BIOPSY     CATARACT EXTRACTION Right 09/2019   Dr. Herbert Deaner   CATARACT EXTRACTION Left 10/2019   Dr. Herbert Deaner   CHOLECYSTECTOMY     CHOLECYSTECTOMY  [phone]?   COLONOSCOPY WITH PROPOFOL N/A 01/22/2017   Procedure: COLONOSCOPY WITH PROPOFOL;  Surgeon: Wilford Corner, MD;  Location: [facility];  Service: Endoscopy;  Laterality: N/A;   EYE SURGERY Bilateral    Cat  Sx - Dr. Herbert Deaner   INSERTION OF DIALYSIS CATHETER Right 10/16/2015   Procedure: INSERTION OF PALINDROME DIALYSIS CATHETER ;  Surgeon: Elam Dutch, MD;  Location: [facility];  Service: Vascular;  Laterality: Right;   OTHER SURGICAL HISTORY     Retinal surgery   PARS PLANA VITRECTOMY  02/17/2012   Procedure: PARS PLANA VITRECTOMY WITH 25 GAUGE;  Surgeon: Hayden Pedro, MD;  Location: [facility];  Service: Ophthalmology;  Laterality: Right;   PERIPHERAL VASCULAR CATHETERIZATION N/A 03/20/2016   Procedure: A/V Shuntogram/Fistulagram;  Surgeon: Conrad [place], MD;  Location: [facility];  Service: Cardiovascular;  Laterality: N/A;   TEE WITHOUT CARDIOVERSION N/A 12/02/2016   Procedure: TRANSESOPHAGEAL ECHOCARDIOGRAM (TEE);  Surgeon: Larey Dresser, MD;  Location: [name] ENDOSCOPY;  Service: Cardiovascular;  Laterality: N/A;   FAMILY HISTORY Family History  Problem Relation Age of Onset   Hypertension Mother    Hypertension Father    Stroke Father    Sickle cell anemia Brother    Diabetes Mother    Sickle cell anemia Brother    Diabetes type I Brother    Kidney disease Brother    SOCIAL HISTORY Social History   Tobacco Use   Smoking status: Never   Smokeless tobacco: Never  Vaping Use   Vaping Use: Never used  Substance Use Topics   Alcohol use: Not Currently    Drug use: Never         OPHTHALMIC EXAM:  Base Eye Exam     Visual Acuity (Snellen - Linear)       Right Left   Dist cc CF 5' 20/100 +2   Dist ph cc NI NI    Correction: Glasses         Tonometry (Tonopen, 9:25 AM)       Right Left   Pressure 18 12         Pupils       Dark Light Shape React APD   Right 3 2 Round Minimal None   Left 3 2 Round Minimal None         Visual Fields (Counting fingers)       Left Right   Restrictions Total inferior temporal deficiency Total superior temporal, inferior nasal deficiencies         Extraocular Movement       Right Left    Full Full         Neuro/Psych     Oriented x3: Yes   Mood/Affect: Normal         Dilation     Both eyes: 1.0% Mydriacyl, 2.5% Phenylephrine @ 9:25 AM           Slit Lamp and Fundus Exam     Slit Lamp Exam       Right Left   Lids/Lashes Dermato Dermato   Conjunctiva/Sclera Melanosis, very attenuated vessels Melanosis   Cornea Arcus, well healed cataract wound, 2-3+ PEE 1-2+ inferior PEE, well healed temporal cataract wounds   Anterior Chamber Deep and quiet, 0.5+ cell/pigment Deep and quiet   Iris Round and poorly dilated to 4.30m, mild residual NVI; +PAS Round and dilated, No NVI   Lens PC IOL in good position, elschnig pearls ST PCIOL   Vitreous Post vitrectomy.  Residual anterior vitreous syneresis.  +RBC - improved, +pigment Mild asteroid hyalosis, syneresis         Fundus Exam       Right Left   Disc 3-4+pallor. Sharp rim Hazy  view, mild pallor, sharp rim   C/D Ratio 0.7 0.5   Macula Blunted foveal reflex. atrophic; ischemic, no heme Flat, Blunted foveal reflex, No heme or edema   Vessels Attenuated, tortuous, severe midzonal attenuation and ischemia, sclerotic Attenuated, tortuous   Periphery Attached, Peripheral laser scars, good posterior fill in 360 Attached.  360 PRP           Refraction     Wearing Rx       Sphere Cylinder Axis Add   Right -1.75  +0.50 090 +2.50   Left -1.50 Sphere  +2.50         Manifest Refraction       Sphere Cylinder Axis Dist VA   Right       Left -1.25 +0.50 135 20/70-2            IMAGING AND PROCEDURES  Imaging and Procedures for 04/30/2021  OCT, Retina - OU - Both Eyes       Right Eye Quality was borderline. Central Foveal Thickness: 225. Progression has been stable. Findings include normal foveal contour, no IRF, no SRF, outer retinal atrophy, epiretinal membrane, inner retinal atrophy, intraretinal hyper-reflective material (diffuse retinal atrophy -- nasal thickening gone, mild ERM).   Left Eye Quality was borderline. Central Foveal Thickness: 251. Progression has been stable. Findings include normal foveal contour, no SRF, epiretinal membrane, no IRF (Diffuse retinal atrophy/thinning; stable retinoschisis temporal midzone caught on widefield).   Notes *Images captured and stored on drive  Diagnosis / Impression:  OD: retinal ischemia / CRAO -- diffuse retinal atrophy -- nasal thickening gone, mild ERM OS: Diffuse retinal atrophy/thinning; retinoschisis temporal midzone caught on widefield -- stable  Clinical management:  See below  Abbreviations: NFP - Normal foveal profile. CME - cystoid macular edema. PED - pigment epithelial detachment. IRF - intraretinal fluid. SRF - subretinal fluid. EZ - ellipsoid zone. ERM - epiretinal membrane. ORA - outer retinal atrophy. ORT - outer retinal tubulation. SRHM - subretinal hyper-reflective material. IRHM - intraretinal hyper-reflective material     Intravitreal Injection, Pharmacologic Agent - OD - Right Eye       Time Out 04/30/2021. 10:02 AM. Confirmed correct patient, procedure, site, and patient consented.   Anesthesia Topical anesthesia was used. Anesthetic medications included Lidocaine 2%, Proparacaine 0.5%.   Procedure Preparation included 5% betadine to ocular surface, eyelid speculum. A supplied needle was used.    Injection: 1.25 mg Bevacizumab 1.64m/0.05ml   Route: Intravitreal, Site: Right Eye   NDC:: 05397-673-41 Lot: [id number], Expiration date: 05/22/2021, Waste: 0 mL   Post-op Post injection exam found visual acuity of at least counting fingers, no retinal detachment. The patient tolerated the procedure well. There were no complications. The patient received written and verbal post procedure care education. Post injection medications were not given.             ASSESSMENT/PLAN:    ICD-10-CM   1. Vitreous hemorrhage, right eye (HCC)  H43.11     2. Hyphema of right eye  H21.01     3. Proliferative diabetic retinopathy of both eyes without macular edema associated with type 2 diabetes mellitus (HCC)  EY81.4481Intravitreal Injection, Pharmacologic Agent - OD - Right Eye    Bevacizumab (AVASTIN) SOLN 1.25 mg    4. Retinal ischemia  H35.82     5. Retinal edema  H35.81 OCT, Retina - OU - Both Eyes    6. Sickle cell retinopathy without crisis ([place]  D57.1    H36  7. Essential hypertension  I10     8. Hypertensive retinopathy of both eyes  H35.033     9. Pseudophakia of both eyes  Z96.1     10. Bilateral dry eyes  H04.123       1,2. VH & microyphema OD secondary to PDR -- stably resolved  - s/p IVA OD #1 (07.29.21)  - VH and microhyphema stably resolved OD   3-5. Proliferative diabetic retinopathy w/ severe retinal ischemia, no DME, OU (OD > OS)  - former pt of Dr. [name], last visit 03.04.21  - history of PPV w/ endolaser, FAX OD 6.11.13; PRP OS 2020  - s/p IVA OD #1 (07.29.21) as above for new VH and microhyphema, #2 (07.19.22)  - s/p PRP OD (08.24.21), fill in (08.02.22)  - BCVA OD stable at CF; OS decreased to 20/100 from 20/70 - OCT without diabetic macular edema, but diffuse atrophy, both eyes -- OD with RAO-like apprearance -- severe ischemia  - FA (08.12.21) shows severe vascular non-perfusion OD -- peripheral nonperfusion extends posteriorly just outside  arcades   - exam and gonio showed new NVI and NVA OD on 7.19.22--regressed post IVA OD on 7.19.22  - mild residual NVI today  - IOP OD 18 today --  continue brimonidine BID OD - continue PF TID OD per Dr. Herbert Deaner - recommend IVA OD #3 today, 08.23.22 for NVI - pt wishes to proceed - RBA of procedure discussed, questions answered - informed consent obtained and signed - see procedure note - F/u in 6 weeks, DFE, OCT  6. History of Sickle Cell Retinopathy  - 360 PRP in place OU  - no active NV, confirmed on FA 8.12.21  7,8. Hypertensive retinopathy OU - discussed importance of tight BP control - monitor  9. Pseudophakia OU  - s/p CE/IOL OU (OD Jan 2021; OS Feb 2021  - IOL in good position, doing well  - monitor  10. Dry eyes OU - recommend artificial tears and lubricating ointment as needed  Ophthalmic Meds Ordered this visit:  Meds ordered this encounter  Medications   Bevacizumab (AVASTIN) SOLN 1.25 mg       Return in about 6 weeks (around 06/11/2021) for f/u PDR OU, DFE, OCT.  There are no Patient Instructions on file for this visit.  Explained the diagnoses, plan, and follow up with the patient and they expressed understanding.  Patient expressed understanding of the importance of proper follow up care.  This document serves as a record of services personally performed by Gardiner Sleeper, MD, PhD. It was created on their behalf by San Jetty. Owens Shark, OA an ophthalmic technician. The creation of this record is the provider's dictation and/or activities during the visit.    Electronically signed by: San Jetty. Marguerita Merles 08.19.2022 12:52 PM  Gardiner Sleeper, M.D., Ph.D. Diseases & Surgery of the Retina and Vitreous [facility]  I have reviewed the above documentation for accuracy and completeness, and I agree with the above. Gardiner Sleeper, M.D., Ph.D. 04/30/21 12:52 PM   Abbreviations: M myopia (nearsighted); A astigmatism; H hyperopia  (farsighted); P presbyopia; Mrx spectacle prescription;  CTL contact lenses; OD right eye; OS left eye; OU both eyes  XT exotropia; ET esotropia; PEK punctate epithelial keratitis; PEE punctate epithelial erosions; DES dry eye syndrome; MGD meibomian gland dysfunction; ATs artificial tears; PFAT's preservative free artificial tears; [place] nuclear sclerotic cataract; PSC posterior subcapsular cataract; ERM epi-retinal membrane; PVD posterior vitreous detachment; RD retinal  detachment; DM diabetes mellitus; DR diabetic retinopathy; NPDR non-proliferative diabetic retinopathy; PDR proliferative diabetic retinopathy; CSME clinically significant macular edema; DME diabetic macular edema; dbh dot blot hemorrhages; CWS cotton wool spot; POAG primary open angle glaucoma; C/D cup-to-disc ratio; HVF humphrey visual field; GVF goldmann visual field; OCT optical coherence tomography; IOP intraocular pressure; BRVO Branch retinal vein occlusion; CRVO central retinal vein occlusion; CRAO central retinal artery occlusion; BRAO branch retinal artery occlusion; RT retinal tear; SB scleral buckle; PPV pars plana vitrectomy; VH Vitreous hemorrhage; PRP panretinal laser photocoagulation; IVK intravitreal kenalog; VMT vitreomacular traction; MH Macular hole;  NVD neovascularization of the disc; NVE neovascularization elsewhere; AREDS age related eye disease study; ARMD age related macular degeneration; POAG primary open angle glaucoma; EBMD epithelial/anterior basement membrane dystrophy; ACIOL anterior chamber intraocular lens; IOL intraocular lens; PCIOL posterior chamber intraocular lens; Phaco/IOL phacoemulsification with intraocular lens placement; [place] photorefractive keratectomy; LASIK laser assisted in situ keratomileusis; HTN hypertension; DM diabetes mellitus; COPD chronic obstructive pulmonary disease

## 2021-04-29 ENCOUNTER — Other Ambulatory Visit (HOSPITAL_COMMUNITY): Payer: Self-pay

## 2021-04-29 DIAGNOSIS — Z992 Dependence on renal dialysis: Secondary | ICD-10-CM | POA: Diagnosis not present

## 2021-04-29 DIAGNOSIS — N186 End stage renal disease: Secondary | ICD-10-CM | POA: Diagnosis not present

## 2021-04-29 DIAGNOSIS — N2581 Secondary hyperparathyroidism of renal origin: Secondary | ICD-10-CM | POA: Diagnosis not present

## 2021-04-30 ENCOUNTER — Other Ambulatory Visit: Payer: Self-pay

## 2021-04-30 ENCOUNTER — Other Ambulatory Visit (HOSPITAL_COMMUNITY): Payer: Self-pay

## 2021-04-30 ENCOUNTER — Encounter (INDEPENDENT_AMBULATORY_CARE_PROVIDER_SITE_OTHER): Payer: Self-pay | Admitting: Ophthalmology

## 2021-04-30 ENCOUNTER — Ambulatory Visit (INDEPENDENT_AMBULATORY_CARE_PROVIDER_SITE_OTHER): Payer: Medicare Other | Admitting: Ophthalmology

## 2021-04-30 DIAGNOSIS — H04123 Dry eye syndrome of bilateral lacrimal glands: Secondary | ICD-10-CM

## 2021-04-30 DIAGNOSIS — H35033 Hypertensive retinopathy, bilateral: Secondary | ICD-10-CM

## 2021-04-30 DIAGNOSIS — H3582 Retinal ischemia: Secondary | ICD-10-CM | POA: Diagnosis not present

## 2021-04-30 DIAGNOSIS — H3581 Retinal edema: Secondary | ICD-10-CM

## 2021-04-30 DIAGNOSIS — D571 Sickle-cell disease without crisis: Secondary | ICD-10-CM | POA: Diagnosis not present

## 2021-04-30 DIAGNOSIS — E113593 Type 2 diabetes mellitus with proliferative diabetic retinopathy without macular edema, bilateral: Secondary | ICD-10-CM | POA: Diagnosis not present

## 2021-04-30 DIAGNOSIS — H36 Retinal disorders in diseases classified elsewhere: Secondary | ICD-10-CM | POA: Diagnosis not present

## 2021-04-30 DIAGNOSIS — H2101 Hyphema, right eye: Secondary | ICD-10-CM

## 2021-04-30 DIAGNOSIS — H4311 Vitreous hemorrhage, right eye: Secondary | ICD-10-CM | POA: Diagnosis not present

## 2021-04-30 DIAGNOSIS — I10 Essential (primary) hypertension: Secondary | ICD-10-CM | POA: Diagnosis not present

## 2021-04-30 DIAGNOSIS — Z961 Presence of intraocular lens: Secondary | ICD-10-CM | POA: Diagnosis not present

## 2021-04-30 MED ORDER — BEVACIZUMAB CHEMO INJECTION 1.25MG/0.05ML SYRINGE FOR KALEIDOSCOPE
1.2500 mg | INTRAVITREAL | Status: AC | PRN
  Administered 2021-04-30: 1.25 mg via INTRAVITREAL

## 2021-05-01 DIAGNOSIS — N186 End stage renal disease: Secondary | ICD-10-CM | POA: Diagnosis not present

## 2021-05-01 DIAGNOSIS — N2581 Secondary hyperparathyroidism of renal origin: Secondary | ICD-10-CM | POA: Diagnosis not present

## 2021-05-01 DIAGNOSIS — Z992 Dependence on renal dialysis: Secondary | ICD-10-CM | POA: Diagnosis not present

## 2021-05-02 DIAGNOSIS — H04123 Dry eye syndrome of bilateral lacrimal glands: Secondary | ICD-10-CM | POA: Diagnosis not present

## 2021-05-02 DIAGNOSIS — H209 Unspecified iridocyclitis: Secondary | ICD-10-CM | POA: Diagnosis not present

## 2021-05-02 DIAGNOSIS — E113593 Type 2 diabetes mellitus with proliferative diabetic retinopathy without macular edema, bilateral: Secondary | ICD-10-CM | POA: Diagnosis not present

## 2021-05-03 DIAGNOSIS — N2581 Secondary hyperparathyroidism of renal origin: Secondary | ICD-10-CM | POA: Diagnosis not present

## 2021-05-03 DIAGNOSIS — Z992 Dependence on renal dialysis: Secondary | ICD-10-CM | POA: Diagnosis not present

## 2021-05-03 DIAGNOSIS — N186 End stage renal disease: Secondary | ICD-10-CM | POA: Diagnosis not present

## 2021-05-06 DIAGNOSIS — N2581 Secondary hyperparathyroidism of renal origin: Secondary | ICD-10-CM | POA: Diagnosis not present

## 2021-05-06 DIAGNOSIS — N186 End stage renal disease: Secondary | ICD-10-CM | POA: Diagnosis not present

## 2021-05-06 DIAGNOSIS — Z992 Dependence on renal dialysis: Secondary | ICD-10-CM | POA: Diagnosis not present

## 2021-05-07 ENCOUNTER — Encounter: Payer: Self-pay | Admitting: Internal Medicine

## 2021-05-07 ENCOUNTER — Ambulatory Visit (INDEPENDENT_AMBULATORY_CARE_PROVIDER_SITE_OTHER): Payer: Medicare Other | Admitting: Internal Medicine

## 2021-05-07 ENCOUNTER — Other Ambulatory Visit: Payer: Self-pay

## 2021-05-07 VITALS — BP 112/70 | HR 77 | Temp 98.0°F | Ht 71.0 in | Wt 185.0 lb

## 2021-05-07 DIAGNOSIS — N186 End stage renal disease: Secondary | ICD-10-CM | POA: Diagnosis not present

## 2021-05-07 DIAGNOSIS — F0151 Vascular dementia with behavioral disturbance: Secondary | ICD-10-CM

## 2021-05-07 DIAGNOSIS — R413 Other amnesia: Secondary | ICD-10-CM | POA: Diagnosis not present

## 2021-05-07 DIAGNOSIS — E1122 Type 2 diabetes mellitus with diabetic chronic kidney disease: Secondary | ICD-10-CM | POA: Diagnosis not present

## 2021-05-07 DIAGNOSIS — I120 Hypertensive chronic kidney disease with stage 5 chronic kidney disease or end stage renal disease: Secondary | ICD-10-CM | POA: Diagnosis not present

## 2021-05-07 DIAGNOSIS — F01518 Vascular dementia, unspecified severity, with other behavioral disturbance: Secondary | ICD-10-CM

## 2021-05-07 DIAGNOSIS — Z79899 Other long term (current) drug therapy: Secondary | ICD-10-CM | POA: Diagnosis not present

## 2021-05-07 NOTE — Progress Notes (Signed)
I,Yamilka [employer] as a Education administrator for [name], MD.,have documented all relevant documentation on the behalf of [name], MD,as directed by  [name], MD while in the presence of [name], MD.  This visit occurred during the SARS-CoV-2 public health emergency.  Safety protocols were in place, including screening questions prior to the visit, additional usage of staff PPE, and extensive cleaning of exam room while observing appropriate contact time as indicated for disinfecting solutions.  Subjective:     Patient ID: Isaiah [name] , male    DOB: 05/24/1966 , 55 y.o.   MRN: [id number]   Chief Complaint  Patient presents with   Diabetes   Hypertension    HPI  Patient presents today for a blood pressure and diabetes f/u  He is also followed by Dr.Balan. He is accompanied by his wife today. He does not offer any complaints today.     Diabetes He presents for his follow-up diabetic visit. He has type 2 diabetes mellitus. His disease course has been stable. There are no hypoglycemic associated symptoms. Pertinent negatives for diabetes include no blurred vision. There are no hypoglycemic complications. Diabetic complications include nephropathy. Risk factors for coronary artery disease include diabetes mellitus, dyslipidemia, hypertension, male sex and obesity. He is following a diabetic diet.  Hypertension This is a chronic problem. The current episode started more than 1 year ago. The problem has been gradually improving since onset. The problem is controlled. Pertinent negatives include no anxiety or blurred vision. The current treatment provides moderate improvement.    Past Medical History:  Diagnosis Date   A-fib ([place])    Anemia    Diabetic retinopathy ([place])    PDR OU   DM type 2 (diabetes mellitus, type 2) ([place]) 06/09/2019   ESRD (end stage renal disease) on dialysis [facility])    Essential hypertension 06/09/2019   GIB (gastrointestinal bleeding)     Recurrent episodes- 09/2014, 09/2015 and 05/2016   Gout    History of recent blood transfusion 10/27/14   2 Units PRBC's   Hyperkalemia 07/2011   Hypertensive retinopathy    OU   Multiple myeloma ([place])    Pulmonary embolism ([place]) 07/2011; 09/27/2014   a. Bilat PE 07/2011 - unclear cause, tx with 6 months Coumadin.;    Seizure disorder ([place]) 06/09/2019   Sepsis ([place])    Sickle cell-thalassemia disease ([place])    a. Sickle cell trait.   Stroke ([place]) 09/2015   R-MCA, L-MCA, PCA and bilateral cerebellar complicated by DVT/PE   Subdural hematoma ([place]) 05/2019     Family History  Problem Relation Age of Onset   Hypertension Mother    Hypertension Father    Stroke Father    Sickle cell anemia Brother    Diabetes Mother    Sickle cell anemia Brother    Diabetes type I Brother    Kidney disease Brother      Current Outpatient Medications:    acetaminophen (TYLENOL) 325 MG tablet, Take 1-2 tablets (325-650 mg total) by mouth every 4 (four) hours as needed for mild pain., Disp:  , Rfl:    albuterol (PROAIR HFA) 108 (90 Base) MCG/ACT inhaler, Inhale two puffs every 4-6 hours if needed for cough or wheeze (Patient taking differently: Inhale 2 puffs into the lungs every 4 (four) hours as needed for wheezing or shortness of breath. Inhale two puffs every 4-6 hours if needed for cough or wheeze), Disp: 6.7 g, Rfl: 2   Alcohol  Swabs (SM ALCOHOL PREP) 70 % PADS, , Disp: , Rfl:    atorvastatin (LIPITOR) 20 MG tablet, TAKE 1 TABLET BY MOUTH ONCE A DAY, Disp: 90 tablet, Rfl: 2   AURYXIA 1 GM 210 MG(Fe) tablet, Take 210 mg by mouth every evening., Disp: , Rfl:    brimonidine (ALPHAGAN) 0.2 % ophthalmic solution, Place 1 drop into the right eye in the morning and at bedtime., Disp: 5 mL, Rfl: 1   calcitRIOL (ROCALTROL) 0.25 MCG capsule, Take 0.25 mcg by mouth daily., Disp: , Rfl:    Cetirizine HCl 10 MG CAPS, Take 1 capsule by mouth daily. , Disp: , Rfl:    Cholecalciferol (D3-1000 PO), Take 1 tablet by  mouth See admin instructions. Take one tablet by mouth on Monday,Wednesday and Fridays, Disp: , Rfl:    clonazePAM (KLONOPIN) 0.5 MG tablet, Take 1 tablet by mouth at bedtime. Also take 2 tablets in morning on Monday, Wednesday, Friday before dialysis, Disp: 60 tablet, Rfl: 5   dexamethasone (DECADRON) 4 MG tablet, Take 4 mg by mouth daily., Disp: , Rfl:    dextrose 50 % solution, Dextrose 50%, Disp: , Rfl:    ferric citrate (AURYXIA) 1 GM 210 MG(Fe) tablet, Take 1 tablet by mouth 3 times a day with meals as directed, Disp: 270 tablet, Rfl: 3   fluticasone furoate-vilanterol (BREO ELLIPTA) 200-25 MCG/INH AEPB, INHALE 1 PUFF BY MOUTH INTO THE LUNGS AT THE SAME TIME EVERY DAY, Disp: 60 each, Rfl: 2   FREESTYLE LITE test strip, , Disp: , Rfl:    Glucagon, rDNA, (GLUCAGON EMERGENCY) 1 MG KIT, See admin instructions., Disp: , Rfl:    Insulin Glargine-yfgn (SEMGLEE, YFGN,) 100 UNIT/ML SOPN, Inject 6 units subcutaneously once a day, Disp: 9 mL, Rfl: 4   Insulin Lispro (HUMALOG KWIKPEN [place]), Inject 2-4 Units into the skin 3 (three) times daily before meals. Sliding scale: 150-250 =2 units, 250 -350=3 units, 350 += 4 units, Disp: , Rfl:    lacosamide (VIMPAT) 200 MG TABS tablet, TAKE 1 TABLET TWICE A DAY EXCEPT ON DIALYSIS DAYS M-W-F GIVE 1 TAB IN AM, 1 TAB AFTER DIALYSIS, 1 TAB IN PM., Disp: 90 tablet, Rfl: 5   lamoTRIgine (LAMICTAL) 200 MG tablet, TAKE 1 TABLET BY MOUTH 2 TIMES DAILY, Disp: 60 tablet, Rfl: 11   Lancets (FREESTYLE) lancets, , Disp: , Rfl:    multivitamin (RENA-VIT) TABS tablet, Take 1 tablet by mouth at bedtime., Disp: , Rfl:    pantoprazole (PROTONIX) 40 MG tablet, TAKE 1 TABLET BY MOUTH ONCE DAILY, Disp: 90 tablet, Rfl: 1   prednisoLONE acetate (PRED FORTE) 1 % ophthalmic suspension, Instill 1 drop Right Eye 3 times a day, Disp: 10 mL, Rfl: 1   QUEtiapine (SEROQUEL) 25 MG tablet, Take 1/2 tablet by mouth every evening, Disp: 15 tablet, Rfl: 6   UNIFINE PENTIPS 31G X 8 MM MISC, USE AS  DIRECTED DAILY AFTER SUPPER, Disp: 100 each, Rfl: 3   midodrine (PROAMATINE) 10 MG tablet, Take 1 tablet (10 mg total) by mouth 2 (two) times daily., Disp: 180 tablet, Rfl: 1   Allergies  Allergen Reactions   Codeine Swelling   Codeine Rash and Other (See Comments)    Unknown reaction (patient says it was more serious than just a rash, but he can't remember what happened)      Review of Systems  Constitutional: Negative.   Eyes:  Negative for blurred vision.  Respiratory: Negative.    Cardiovascular: Negative.   Gastrointestinal: Negative.  Psychiatric/Behavioral: Negative.    All other systems reviewed and are negative.   Today's Vitals   05/07/21 1503  BP: 112/70  Pulse: 77  Temp: 98 F (36.7 C)  TempSrc: Oral  Weight: 185 lb (83.9 kg)  Height: 5' 11"  (1.803 m)  PainSc: 0-No pain   Body mass index is 25.8 kg/m.  Wt Readings from Last 3 Encounters:  05/07/21 185 lb (83.9 kg)  04/18/21 185 lb (83.9 kg)  04/18/21 185 lb (83.9 kg)    Objective:  Physical Exam Vitals and nursing note reviewed.  Constitutional:      Appearance: Normal appearance.     Comments: He was examined in wheelchair.   HENT:     Head: Normocephalic and atraumatic.     Nose:     Comments: Masked     Mouth/Throat:     Comments: Masked  Eyes:     Extraocular Movements: Extraocular movements intact.  Cardiovascular:     Rate and Rhythm: Normal rate and regular rhythm.     Heart sounds: Normal heart sounds.  Pulmonary:     Effort: Pulmonary effort is normal.     Breath sounds: Normal breath sounds.  Musculoskeletal:     Cervical back: Normal range of motion.  Skin:    General: Skin is warm.  Neurological:     Mental Status: He is alert. Mental status is at baseline.  Psychiatric:        Mood and Affect: Mood normal.        Assessment And Plan:     1. Diabetes mellitus with end-stage renal disease ([place]) Comments: Chronic, followed by Dr. Chalmers Cater. Most recent scanned notes were  reviewed in full detail.   2. Benign hypertensive kidney disease with chronic kidney disease stage V or end stage renal disease ([place]) Comments: Chronic, well controlled. Encouraged to follow low sodium diet.   3. Memory loss Comments: I will check labs as listed below.  I will make further recommendations once her labs are available for review.  - Vitamin B12 - TSH  4. Vascular dementia with behavior disturbance ([place]) Comments: Neuro notes reviewed. He is at risk for above with underlying DM, HTN.   5. Polypharmacy   Patient was given opportunity to ask questions. Patient verbalized understanding of the plan and was able to repeat key elements of the plan. All questions were answered to their satisfaction.   I, [name], MD, have reviewed all documentation for this visit. The documentation on 05/07/21 for the exam, diagnosis, procedures, and orders are all accurate and complete.   IF YOU HAVE BEEN REFERRED TO A SPECIALIST, IT MAY TAKE 1-2 WEEKS TO SCHEDULE/PROCESS THE REFERRAL. IF YOU HAVE NOT HEARD FROM US/SPECIALIST IN TWO WEEKS, PLEASE GIVE [place] A CALL AT [phone].   THE PATIENT IS ENCOURAGED TO PRACTICE SOCIAL DISTANCING DUE TO THE COVID-19 PANDEMIC.

## 2021-05-07 NOTE — Patient Instructions (Signed)
Diabetes Mellitus and Nutrition, Adult When you have diabetes, or diabetes mellitus, it is very important to have healthy eating habits because your blood sugar (glucose) levels are greatly affected by what you eat and drink. Eating healthy foods in the right amounts, at about the same times every day, can help you:  Control your blood glucose.  Lower your risk of heart disease.  Improve your blood pressure.  Reach or maintain a healthy weight. What can affect my meal plan? Every person with diabetes is different, and each person has different needs for a meal plan. Your health care provider may recommend that you work with a dietitian to make a meal plan that is best for you. Your meal plan may vary depending on factors such as:  The calories you need.  The medicines you take.  Your weight.  Your blood glucose, blood pressure, and cholesterol levels.  Your activity level.  Other health conditions you have, such as heart or kidney disease. How do carbohydrates affect me? Carbohydrates, also called carbs, affect your blood glucose level more than any other type of food. Eating carbs naturally raises the amount of glucose in your blood. Carb counting is a method for keeping track of how many carbs you eat. Counting carbs is important to keep your blood glucose at a healthy level, especially if you use insulin or take certain oral diabetes medicines. It is important to know how many carbs you can safely have in each meal. This is different for every person. Your dietitian can help you calculate how many carbs you should have at each meal and for each snack. How does alcohol affect me? Alcohol can cause a sudden decrease in blood glucose (hypoglycemia), especially if you use insulin or take certain oral diabetes medicines. Hypoglycemia can be a life-threatening condition. Symptoms of hypoglycemia, such as sleepiness, dizziness, and confusion, are similar to symptoms of having too much  alcohol.  Do not drink alcohol if: ? Your health care provider tells you not to drink. ? You are pregnant, may be pregnant, or are planning to become pregnant.  If you drink alcohol: ? Do not drink on an empty stomach. ? Limit how much you use to:  0-1 drink a day for women.  0-2 drinks a day for men. ? Be aware of how much alcohol is in your drink. In the [place] one drink equals one 12 oz bottle of beer (355 mL), one 5 oz glass of wine (148 mL), or one 1 oz glass of hard liquor (44 mL). ? Keep yourself hydrated with water, diet soda, or unsweetened iced tea.  Keep in mind that regular soda, juice, and other mixers may contain a lot of sugar and must be counted as carbs. What are tips for following this plan? Reading food labels  Start by checking the serving size on the "Nutrition Facts" label of packaged foods and drinks. The amount of calories, carbs, fats, and other nutrients listed on the label is based on one serving of the item. Many items contain more than one serving per package.  Check the total grams (g) of carbs in one serving. You can calculate the number of servings of carbs in one serving by dividing the total carbs by 15. For example, if a food has 30 g of total carbs per serving, it would be equal to 2 servings of carbs.  Check the number of grams (g) of saturated fats and trans fats in one serving. Choose foods that have   a low amount or none of these fats.  Check the number of milligrams (mg) of salt (sodium) in one serving. Most people should limit total sodium intake to less than 2,300 mg per day.  Always check the nutrition information of foods labeled as "low-fat" or "nonfat." These foods may be higher in added sugar or refined carbs and should be avoided.  Talk to your dietitian to identify your daily goals for nutrients listed on the label. Shopping  Avoid buying canned, pre-made, or processed foods. These foods tend to be high in fat, sodium, and added  sugar.  Shop around the outside edge of the grocery store. This is where you will most often find fresh fruits and vegetables, bulk grains, fresh meats, and fresh dairy. Cooking  Use low-heat cooking methods, such as baking, instead of high-heat cooking methods like deep frying.  Cook using healthy oils, such as olive, canola, or sunflower oil.  Avoid cooking with butter, cream, or high-fat meats. Meal planning  Eat meals and snacks regularly, preferably at the same times every day. Avoid going long periods of time without eating.  Eat foods that are high in fiber, such as fresh fruits, vegetables, beans, and whole grains. Talk with your dietitian about how many servings of carbs you can eat at each meal.  Eat 4-6 oz (112-168 g) of lean protein each day, such as lean meat, chicken, fish, eggs, or tofu. One ounce (oz) of lean protein is equal to: ? 1 oz (28 g) of meat, chicken, or fish. ? 1 egg. ?  cup (62 g) of tofu.  Eat some foods each day that contain healthy fats, such as avocado, nuts, seeds, and fish.   What foods should I eat? Fruits Berries. Apples. Oranges. Peaches. Apricots. Plums. Grapes. Mango. Papaya. Pomegranate. Kiwi. Cherries. Vegetables Lettuce. Spinach. Leafy greens, including kale, chard, collard greens, and mustard greens. Beets. Cauliflower. Cabbage. Broccoli. Carrots. Green beans. Tomatoes. Peppers. Onions. Cucumbers. Brussels sprouts. Grains Whole grains, such as whole-wheat or whole-grain bread, crackers, tortillas, cereal, and pasta. Unsweetened oatmeal. Quinoa. Brown or wild rice. Meats and other proteins Seafood. Poultry without skin. Lean cuts of poultry and beef. Tofu. Nuts. Seeds. Dairy Low-fat or fat-free dairy products such as milk, yogurt, and cheese. The items listed above may not be a complete list of foods and beverages you can eat. Contact a dietitian for more information. What foods should I avoid? Fruits Fruits canned with  syrup. Vegetables Canned vegetables. Frozen vegetables with butter or cream sauce. Grains Refined white flour and flour products such as bread, pasta, snack foods, and cereals. Avoid all processed foods. Meats and other proteins Fatty cuts of meat. Poultry with skin. Breaded or fried meats. Processed meat. Avoid saturated fats. Dairy Full-fat yogurt, cheese, or milk. Beverages Sweetened drinks, such as soda or iced tea. The items listed above may not be a complete list of foods and beverages you should avoid. Contact a dietitian for more information. Questions to ask a health care provider  Do I need to meet with a diabetes educator?  Do I need to meet with a dietitian?  What number can I call if I have questions?  When are the best times to check my blood glucose? Where to find more information:  American Diabetes Association: diabetes.org  Academy of Nutrition and Dietetics: www.eatright.org  National Institute of Diabetes and Digestive and Kidney Diseases: www.niddk.nih.gov  Association of Diabetes Care and Education Specialists: www.diabeteseducator.org Summary  It is important to have healthy eating   habits because your blood sugar (glucose) levels are greatly affected by what you eat and drink.  A healthy meal plan will help you control your blood glucose and maintain a healthy lifestyle.  Your health care provider may recommend that you work with a dietitian to make a meal plan that is best for you.  Keep in mind that carbohydrates (carbs) and alcohol have immediate effects on your blood glucose levels. It is important to count carbs and to use alcohol carefully. This information is not intended to replace advice given to you by your health care provider. Make sure you discuss any questions you have with your health care provider. Document Revised: 08/02/2019 Document Reviewed: 08/02/2019 Elsevier Patient Education  2021 Elsevier Inc.  

## 2021-05-08 DIAGNOSIS — Z992 Dependence on renal dialysis: Secondary | ICD-10-CM | POA: Diagnosis not present

## 2021-05-08 DIAGNOSIS — N186 End stage renal disease: Secondary | ICD-10-CM | POA: Diagnosis not present

## 2021-05-08 DIAGNOSIS — N2581 Secondary hyperparathyroidism of renal origin: Secondary | ICD-10-CM | POA: Diagnosis not present

## 2021-05-08 LAB — VITAMIN B12: Vitamin B-12: 1346 pg/mL — ABNORMAL HIGH (ref 232–1245)

## 2021-05-08 LAB — TSH: TSH: 2.88 u[IU]/mL (ref 0.450–4.500)

## 2021-05-10 ENCOUNTER — Telehealth: Payer: Medicare Other

## 2021-05-10 ENCOUNTER — Telehealth: Payer: Self-pay

## 2021-05-10 DIAGNOSIS — N2581 Secondary hyperparathyroidism of renal origin: Secondary | ICD-10-CM | POA: Diagnosis not present

## 2021-05-10 DIAGNOSIS — D631 Anemia in chronic kidney disease: Secondary | ICD-10-CM | POA: Diagnosis not present

## 2021-05-10 DIAGNOSIS — Z992 Dependence on renal dialysis: Secondary | ICD-10-CM | POA: Diagnosis not present

## 2021-05-10 DIAGNOSIS — N186 End stage renal disease: Secondary | ICD-10-CM | POA: Diagnosis not present

## 2021-05-10 NOTE — Telephone Encounter (Signed)
  Care Management   Follow Up Note   05/10/2021 Name: Isaiah [name] [facility] MRN: [id number] DOB: Feb 12, 1966   Referred by: [name], MD Reason for referral : Chronic Care Management (RN CM Follow up call )   An unsuccessful telephone outreach was attempted today. The patient was referred to the case management team for assistance with care management and care coordination.   Follow Up Plan: A HIPPA compliant phone message was left for the patient providing contact information and requesting a return call.   Barb Merino, RN, BSN, CCM Care Management Coordinator [place] [facility]  Direct Phone: [phone]

## 2021-05-13 DIAGNOSIS — N186 End stage renal disease: Secondary | ICD-10-CM | POA: Diagnosis not present

## 2021-05-13 DIAGNOSIS — N2581 Secondary hyperparathyroidism of renal origin: Secondary | ICD-10-CM | POA: Diagnosis not present

## 2021-05-13 DIAGNOSIS — D631 Anemia in chronic kidney disease: Secondary | ICD-10-CM | POA: Diagnosis not present

## 2021-05-13 DIAGNOSIS — Z992 Dependence on renal dialysis: Secondary | ICD-10-CM | POA: Diagnosis not present

## 2021-05-15 DIAGNOSIS — N2581 Secondary hyperparathyroidism of renal origin: Secondary | ICD-10-CM | POA: Diagnosis not present

## 2021-05-15 DIAGNOSIS — D631 Anemia in chronic kidney disease: Secondary | ICD-10-CM | POA: Diagnosis not present

## 2021-05-15 DIAGNOSIS — N186 End stage renal disease: Secondary | ICD-10-CM | POA: Diagnosis not present

## 2021-05-15 DIAGNOSIS — Z992 Dependence on renal dialysis: Secondary | ICD-10-CM | POA: Diagnosis not present

## 2021-05-16 ENCOUNTER — Other Ambulatory Visit: Payer: Self-pay | Admitting: Internal Medicine

## 2021-05-16 ENCOUNTER — Encounter: Payer: Self-pay | Admitting: Oncology

## 2021-05-16 ENCOUNTER — Other Ambulatory Visit (HOSPITAL_COMMUNITY): Payer: Self-pay

## 2021-05-16 MED ORDER — MIDODRINE HCL 10 MG PO TABS
10.0000 mg | ORAL_TABLET | Freq: Two times a day (BID) | ORAL | 1 refills | Status: DC
  Filled 2021-05-16: qty 180, 90d supply, fill #0
  Filled 2021-05-16: qty 14, 7d supply, fill #0
  Filled 2021-05-16: qty 180, 90d supply, fill #0
  Filled 2021-05-27: qty 180, 90d supply, fill #1
  Filled 2021-08-25: qty 166, 83d supply, fill #2

## 2021-05-17 DIAGNOSIS — N186 End stage renal disease: Secondary | ICD-10-CM | POA: Diagnosis not present

## 2021-05-17 DIAGNOSIS — D631 Anemia in chronic kidney disease: Secondary | ICD-10-CM | POA: Diagnosis not present

## 2021-05-17 DIAGNOSIS — N2581 Secondary hyperparathyroidism of renal origin: Secondary | ICD-10-CM | POA: Diagnosis not present

## 2021-05-17 DIAGNOSIS — Z992 Dependence on renal dialysis: Secondary | ICD-10-CM | POA: Diagnosis not present

## 2021-05-20 DIAGNOSIS — E1122 Type 2 diabetes mellitus with diabetic chronic kidney disease: Secondary | ICD-10-CM | POA: Diagnosis not present

## 2021-05-20 DIAGNOSIS — N186 End stage renal disease: Secondary | ICD-10-CM | POA: Diagnosis not present

## 2021-05-20 DIAGNOSIS — E1065 Type 1 diabetes mellitus with hyperglycemia: Secondary | ICD-10-CM | POA: Diagnosis not present

## 2021-05-20 DIAGNOSIS — Z23 Encounter for immunization: Secondary | ICD-10-CM | POA: Diagnosis not present

## 2021-05-20 DIAGNOSIS — Z992 Dependence on renal dialysis: Secondary | ICD-10-CM | POA: Diagnosis not present

## 2021-05-20 DIAGNOSIS — N2581 Secondary hyperparathyroidism of renal origin: Secondary | ICD-10-CM | POA: Diagnosis not present

## 2021-05-20 DIAGNOSIS — D631 Anemia in chronic kidney disease: Secondary | ICD-10-CM | POA: Diagnosis not present

## 2021-05-22 DIAGNOSIS — D631 Anemia in chronic kidney disease: Secondary | ICD-10-CM | POA: Diagnosis not present

## 2021-05-22 DIAGNOSIS — N2581 Secondary hyperparathyroidism of renal origin: Secondary | ICD-10-CM | POA: Diagnosis not present

## 2021-05-22 DIAGNOSIS — Z992 Dependence on renal dialysis: Secondary | ICD-10-CM | POA: Diagnosis not present

## 2021-05-22 DIAGNOSIS — N186 End stage renal disease: Secondary | ICD-10-CM | POA: Diagnosis not present

## 2021-05-22 DIAGNOSIS — Z23 Encounter for immunization: Secondary | ICD-10-CM | POA: Diagnosis not present

## 2021-05-24 DIAGNOSIS — D631 Anemia in chronic kidney disease: Secondary | ICD-10-CM | POA: Diagnosis not present

## 2021-05-24 DIAGNOSIS — N186 End stage renal disease: Secondary | ICD-10-CM | POA: Diagnosis not present

## 2021-05-24 DIAGNOSIS — N2581 Secondary hyperparathyroidism of renal origin: Secondary | ICD-10-CM | POA: Diagnosis not present

## 2021-05-24 DIAGNOSIS — Z23 Encounter for immunization: Secondary | ICD-10-CM | POA: Diagnosis not present

## 2021-05-24 DIAGNOSIS — Z992 Dependence on renal dialysis: Secondary | ICD-10-CM | POA: Diagnosis not present

## 2021-05-27 ENCOUNTER — Other Ambulatory Visit (HOSPITAL_COMMUNITY): Payer: Self-pay

## 2021-05-27 ENCOUNTER — Other Ambulatory Visit: Payer: Self-pay | Admitting: Internal Medicine

## 2021-05-27 DIAGNOSIS — N186 End stage renal disease: Secondary | ICD-10-CM | POA: Diagnosis not present

## 2021-05-27 DIAGNOSIS — N2581 Secondary hyperparathyroidism of renal origin: Secondary | ICD-10-CM | POA: Diagnosis not present

## 2021-05-27 DIAGNOSIS — D631 Anemia in chronic kidney disease: Secondary | ICD-10-CM | POA: Diagnosis not present

## 2021-05-27 DIAGNOSIS — Z23 Encounter for immunization: Secondary | ICD-10-CM | POA: Diagnosis not present

## 2021-05-27 DIAGNOSIS — Z992 Dependence on renal dialysis: Secondary | ICD-10-CM | POA: Diagnosis not present

## 2021-05-28 ENCOUNTER — Other Ambulatory Visit: Payer: 59

## 2021-05-29 ENCOUNTER — Other Ambulatory Visit (HOSPITAL_COMMUNITY): Payer: Self-pay

## 2021-05-29 DIAGNOSIS — N186 End stage renal disease: Secondary | ICD-10-CM | POA: Diagnosis not present

## 2021-05-29 DIAGNOSIS — Z992 Dependence on renal dialysis: Secondary | ICD-10-CM | POA: Diagnosis not present

## 2021-05-29 DIAGNOSIS — Z23 Encounter for immunization: Secondary | ICD-10-CM | POA: Diagnosis not present

## 2021-05-29 DIAGNOSIS — D631 Anemia in chronic kidney disease: Secondary | ICD-10-CM | POA: Diagnosis not present

## 2021-05-29 DIAGNOSIS — N2581 Secondary hyperparathyroidism of renal origin: Secondary | ICD-10-CM | POA: Diagnosis not present

## 2021-05-29 MED ORDER — FLUTICASONE FUROATE-VILANTEROL 200-25 MCG/INH IN AEPB
INHALATION_SPRAY | RESPIRATORY_TRACT | 2 refills | Status: DC
  Filled 2021-05-29: qty 60, 30d supply, fill #0

## 2021-05-30 ENCOUNTER — Other Ambulatory Visit (HOSPITAL_COMMUNITY): Payer: Self-pay

## 2021-05-31 ENCOUNTER — Other Ambulatory Visit (HOSPITAL_COMMUNITY): Payer: Self-pay

## 2021-05-31 DIAGNOSIS — N186 End stage renal disease: Secondary | ICD-10-CM | POA: Diagnosis not present

## 2021-05-31 DIAGNOSIS — D631 Anemia in chronic kidney disease: Secondary | ICD-10-CM | POA: Diagnosis not present

## 2021-05-31 DIAGNOSIS — Z992 Dependence on renal dialysis: Secondary | ICD-10-CM | POA: Diagnosis not present

## 2021-05-31 DIAGNOSIS — N2581 Secondary hyperparathyroidism of renal origin: Secondary | ICD-10-CM | POA: Diagnosis not present

## 2021-05-31 DIAGNOSIS — Z23 Encounter for immunization: Secondary | ICD-10-CM | POA: Diagnosis not present

## 2021-06-03 DIAGNOSIS — Z992 Dependence on renal dialysis: Secondary | ICD-10-CM | POA: Diagnosis not present

## 2021-06-03 DIAGNOSIS — N186 End stage renal disease: Secondary | ICD-10-CM | POA: Diagnosis not present

## 2021-06-03 DIAGNOSIS — N2581 Secondary hyperparathyroidism of renal origin: Secondary | ICD-10-CM | POA: Diagnosis not present

## 2021-06-03 DIAGNOSIS — D631 Anemia in chronic kidney disease: Secondary | ICD-10-CM | POA: Diagnosis not present

## 2021-06-03 DIAGNOSIS — Z23 Encounter for immunization: Secondary | ICD-10-CM | POA: Diagnosis not present

## 2021-06-04 ENCOUNTER — Ambulatory Visit (INDEPENDENT_AMBULATORY_CARE_PROVIDER_SITE_OTHER): Payer: Medicare Other

## 2021-06-04 ENCOUNTER — Telehealth: Payer: Medicare Other

## 2021-06-04 ENCOUNTER — Ambulatory Visit: Payer: 59 | Admitting: Oncology

## 2021-06-04 DIAGNOSIS — C9000 Multiple myeloma not having achieved remission: Secondary | ICD-10-CM

## 2021-06-04 DIAGNOSIS — D631 Anemia in chronic kidney disease: Secondary | ICD-10-CM | POA: Diagnosis not present

## 2021-06-04 DIAGNOSIS — Z23 Encounter for immunization: Secondary | ICD-10-CM | POA: Diagnosis not present

## 2021-06-04 DIAGNOSIS — Z992 Dependence on renal dialysis: Secondary | ICD-10-CM | POA: Diagnosis not present

## 2021-06-04 DIAGNOSIS — E1122 Type 2 diabetes mellitus with diabetic chronic kidney disease: Secondary | ICD-10-CM

## 2021-06-04 DIAGNOSIS — N186 End stage renal disease: Secondary | ICD-10-CM | POA: Diagnosis not present

## 2021-06-04 DIAGNOSIS — N2581 Secondary hyperparathyroidism of renal origin: Secondary | ICD-10-CM | POA: Diagnosis not present

## 2021-06-07 DIAGNOSIS — D631 Anemia in chronic kidney disease: Secondary | ICD-10-CM | POA: Diagnosis not present

## 2021-06-07 DIAGNOSIS — N2581 Secondary hyperparathyroidism of renal origin: Secondary | ICD-10-CM | POA: Diagnosis not present

## 2021-06-07 DIAGNOSIS — N186 End stage renal disease: Secondary | ICD-10-CM | POA: Diagnosis not present

## 2021-06-07 DIAGNOSIS — E1122 Type 2 diabetes mellitus with diabetic chronic kidney disease: Secondary | ICD-10-CM

## 2021-06-07 DIAGNOSIS — Z992 Dependence on renal dialysis: Secondary | ICD-10-CM | POA: Diagnosis not present

## 2021-06-07 DIAGNOSIS — Z23 Encounter for immunization: Secondary | ICD-10-CM | POA: Diagnosis not present

## 2021-06-08 DIAGNOSIS — E1122 Type 2 diabetes mellitus with diabetic chronic kidney disease: Secondary | ICD-10-CM | POA: Diagnosis not present

## 2021-06-08 DIAGNOSIS — Z992 Dependence on renal dialysis: Secondary | ICD-10-CM | POA: Diagnosis not present

## 2021-06-08 DIAGNOSIS — N186 End stage renal disease: Secondary | ICD-10-CM | POA: Diagnosis not present

## 2021-06-10 DIAGNOSIS — N186 End stage renal disease: Secondary | ICD-10-CM | POA: Diagnosis not present

## 2021-06-10 DIAGNOSIS — D631 Anemia in chronic kidney disease: Secondary | ICD-10-CM | POA: Diagnosis not present

## 2021-06-10 DIAGNOSIS — N2581 Secondary hyperparathyroidism of renal origin: Secondary | ICD-10-CM | POA: Diagnosis not present

## 2021-06-10 DIAGNOSIS — Z992 Dependence on renal dialysis: Secondary | ICD-10-CM | POA: Diagnosis not present

## 2021-06-10 DIAGNOSIS — Z23 Encounter for immunization: Secondary | ICD-10-CM | POA: Diagnosis not present

## 2021-06-10 NOTE — Progress Notes (Signed)
[facility]  06/11/2021     CHIEF COMPLAINT Patient presents for Retina Follow Up  HISTORY OF PRESENT ILLNESS: MAKO [name] is a 55 y.o. male who presents to the clinic today for:   HPI     Retina Follow Up   Patient presents with  Diabetic Retinopathy.  In both eyes.  This started years ago.  Severity is moderate.  Duration of 6 weeks.  Since onset it is stable.  I, the attending physician,  performed the HPI with the patient and updated documentation appropriately.        Comments   55 y/o male pt here for 6 wk f/u for PDR OU.  No change in [place] OU.  Denies pain, FOL, floaters.  No gtts.  BS 154 a couple days ago.  A1C 5.7 on 6.21.22.      Last edited by [name], MD on 06/11/2021 10:08 PM.    Pt states the day after he had laser, he had dialysis and noticed eye pain, he states it resolved on its own  Referring physician: [name], MD [address] [place],  [place] [address]  HISTORICAL INFORMATION:   Selected notes from the [name] Pt of Dr. [name].  Urgent re-referral from Dr. [name] for possible PDR w/NVI OD.  [facility]ed to [name] on 7.27.21 w/elevated IOPS and OD swelling and irritation.  Started on Brimonidine BID OD and Pred Q1hr OD.   CURRENT MEDICATIONS: Current Outpatient Medications (Ophthalmic Drugs)  Medication Sig   brimonidine (ALPHAGAN) 0.2 % ophthalmic solution Place 1 drop into the right eye in the morning and at bedtime.   prednisoLONE acetate (PRED FORTE) 1 % ophthalmic suspension Instill 1 drop Right Eye 3 times a day   No current facility-administered medications for this visit. (Ophthalmic Drugs)   Current Outpatient Medications (Other)  Medication Sig   acetaminophen (TYLENOL) 325 MG tablet Take 1-2 tablets (325-650 mg total) by mouth every 4 (four) hours as needed for mild pain.   acetaminophen (TYLENOL) 325 MG tablet Take by mouth.   acyclovir (ZOVIRAX) 400 MG  tablet Take by mouth.   albuterol (PROAIR HFA) 108 (90 Base) MCG/ACT inhaler Inhale two puffs every 4-6 hours if needed for cough or wheeze (Patient taking differently: Inhale 2 puffs into the lungs every 4 (four) hours as needed for wheezing or shortness of breath. Inhale two puffs every 4-6 hours if needed for cough or wheeze)   Alcohol Swabs (SM ALCOHOL PREP) 70 % PADS    atorvastatin (LIPITOR) 20 MG tablet TAKE 1 TABLET BY MOUTH ONCE A DAY   AURYXIA 1 GM 210 MG(Fe) tablet Take 210 mg by mouth every evening.   calcitRIOL (ROCALTROL) 0.25 MCG capsule Take 0.25 mcg by mouth daily.   camphor-menthol (SARNA) lotion Apply topically.   Cetirizine HCl 10 MG CAPS Take 1 capsule by mouth daily.    Cholecalciferol (D3-1000 PO) Take 1 tablet by mouth See admin instructions. Take one tablet by mouth on Monday,Wednesday and Fridays   Cinacalcet HCl (SENSIPAR PO) Take by mouth.   clonazePAM (KLONOPIN) 0.5 MG tablet Take 1 tablet by mouth at bedtime. Also take 2 tablets in morning on Monday, Wednesday, Friday before dialysis   daratumumab-hyaluronidase-fihj (DARZALEX FASPRO) 1800-30000 MG-UT/15ML SOLN Inject into the skin.   dexamethasone (DECADRON) 4 MG tablet Take 4 mg by mouth daily.   dexamethasone (DECADRON) 4 MG tablet Take by mouth.   dextrose 50 %  solution Dextrose 50%   ferric citrate (AURYXIA) 1 GM 210 MG(Fe) tablet Take 1 tablet by mouth 3 times a day with meals as directed   fluticasone (FLONASE) 50 MCG/ACT nasal spray Place into the nose.   fluticasone furoate-vilanterol (BREO ELLIPTA) 200-25 MCG/INH AEPB INHALE 1 PUFF BY MOUTH INTO THE LUNGS AT THE SAME TIME EVERY DAY   FREESTYLE LITE test strip    Glucagon, rDNA, (GLUCAGON EMERGENCY) 1 MG KIT See admin instructions.   hydrOXYzine (ATARAX/VISTARIL) 25 MG tablet Take by mouth.   Insulin Glargine-yfgn (SEMGLEE, YFGN,) 100 UNIT/ML SOPN Inject 6 units subcutaneously once a day   Insulin Lispro (HUMALOG KWIKPEN [place]) Inject 2-4 Units into the skin 3  (three) times daily before meals. Sliding scale: 150-250 =2 units, 250 -350=3 units, 350 += 4 units   lacosamide (VIMPAT) 200 MG TABS tablet TAKE 1 TABLET TWICE A DAY EXCEPT ON DIALYSIS DAYS M-W-F GIVE 1 TAB IN AM, 1 TAB AFTER DIALYSIS, 1 TAB IN PM.   lamoTRIgine (LAMICTAL) 200 MG tablet TAKE 1 TABLET BY MOUTH 2 TIMES DAILY   lamoTRIgine 200 MG TBDP Take by mouth.   Lancets (FREESTYLE) lancets    Methoxy PEG-Epoetin Beta (MIRCERA IJ) Mircera   metoprolol succinate (TOPROL-XL) 25 MG 24 hr tablet Take by mouth.   midodrine (PROAMATINE) 10 MG tablet Take 1 tablet (10 mg total) by mouth 2 (two) times daily.   multivitamin (RENA-VIT) TABS tablet Take 1 tablet by mouth at bedtime.   pantoprazole (PROTONIX) 40 MG tablet TAKE 1 TABLET BY MOUTH ONCE DAILY   PFIZER-BIONT COVID-19 VAC-TRIS SUSP injection    PREVNAR 20 0.5 ML injection    prochlorperazine (COMPAZINE) 10 MG tablet Take by mouth.   QUEtiapine (SEROQUEL) 25 MG tablet Take 1/2 tablet by mouth every evening   UNIFINE PENTIPS 31G X 8 MM MISC USE AS DIRECTED DAILY AFTER SUPPER   No current facility-administered medications for this visit. (Other)   REVIEW OF SYSTEMS: ROS   Positive for: Gastrointestinal, Neurological, Genitourinary, Endocrine, Cardiovascular, Eyes, Respiratory Negative for: Constitutional, Skin, Musculoskeletal, HENT, Psychiatric, Allergic/Imm, Heme/Lymph Last edited by Matthew Folks, COA on 06/11/2021  9:55 AM.     ALLERGIES Allergies  Allergen Reactions   Codeine Swelling   Codeine Rash and Other (See Comments)    Unknown reaction (patient says it was more serious than just a rash, but he can't remember what happened)    PAST MEDICAL HISTORY Past Medical History:  Diagnosis Date   A-fib ([name])    Anemia    Diabetic retinopathy ([place])    PDR OU   DM type 2 (diabetes mellitus, type 2) ([place]) 06/09/2019   ESRD (end stage renal disease) on dialysis [facility])    Essential hypertension 06/09/2019   GIB (gastrointestinal  bleeding)    Recurrent episodes- 09/2014, 09/2015 and 05/2016   Gout    History of recent blood transfusion 10/27/14   2 Units PRBC's   Hyperkalemia 07/2011   Hypertensive retinopathy    OU   Multiple myeloma ([place])    Pulmonary embolism ([place]) 07/2011; 09/27/2014   a. Bilat PE 07/2011 - unclear cause, tx with 6 months Coumadin.;    Seizure disorder ([place]) 06/09/2019   Sepsis ([place])    Sickle cell-thalassemia disease ([place])    a. Sickle cell trait.   Stroke ([place]) 09/2015   R-MCA, L-MCA, PCA and bilateral cerebellar complicated by DVT/PE   Subdural hematoma 05/2019   Past Surgical History:  Procedure Laterality Date   AV FISTULA  PLACEMENT Right 12/05/2015   Procedure: INSERTION OF ARTERIOVENOUS (AV) GORE-TEX GRAFT ARM;  Surgeon: Elam Dutch, MD;  Location: [facility];  Service: Vascular;  Laterality: Right;   BONE MARROW BIOPSY     CATARACT EXTRACTION Right 09/2019   Dr. Herbert Deaner   CATARACT EXTRACTION Left 10/2019   Dr. Herbert Deaner   CHOLECYSTECTOMY     CHOLECYSTECTOMY  [phone]?   COLONOSCOPY WITH PROPOFOL N/A 01/22/2017   Procedure: COLONOSCOPY WITH PROPOFOL;  Surgeon: Wilford Corner, MD;  Location: [facility];  Service: Endoscopy;  Laterality: N/A;   EYE SURGERY Bilateral    Cat Sx - Dr. Herbert Deaner   INSERTION OF DIALYSIS CATHETER Right 10/16/2015   Procedure: INSERTION OF PALINDROME DIALYSIS CATHETER ;  Surgeon: Elam Dutch, MD;  Location: [facility];  Service: Vascular;  Laterality: Right;   OTHER SURGICAL HISTORY     Retinal surgery   PARS PLANA VITRECTOMY  02/17/2012   Procedure: PARS PLANA VITRECTOMY WITH 25 GAUGE;  Surgeon: Hayden Pedro, MD;  Location: [facility];  Service: Ophthalmology;  Laterality: Right;   PERIPHERAL VASCULAR CATHETERIZATION N/A 03/20/2016   Procedure: A/V Shuntogram/Fistulagram;  Surgeon: Conrad Blanco, MD;  Location: [facility];  Service: Cardiovascular;  Laterality: N/A;   TEE WITHOUT CARDIOVERSION N/A 12/02/2016   Procedure: TRANSESOPHAGEAL ECHOCARDIOGRAM  (TEE);  Surgeon: Larey Dresser, MD;  Location[facility];  Service: Cardiovascular;  Laterality: N/A;   FAMILY HISTORY Family History  Problem Relation Age of Onset   Hypertension Mother    Hypertension Father    Stroke Father    Sickle cell anemia Brother    Diabetes Mother    Sickle cell anemia Brother    Diabetes type I Brother    Kidney disease Brother    SOCIAL HISTORY Social History   Tobacco Use   Smoking status: Never   Smokeless tobacco: Never  Vaping Use   Vaping Use: Never used  Substance Use Topics   Alcohol use: Not Currently   Drug use: Never       OPHTHALMIC EXAM:  Base Eye Exam     Visual Acuity (Snellen - Linear)       Right Left   Dist cc CF @ 3' 20/80 -2   Dist ph cc NI NI    Correction: Glasses         Tonometry (Tonopen, 9:59 AM)       Right Left   Pressure 11 10         Pupils       Dark Light Shape React APD   Right 3 2 Round Minimal None   Left 3 2 Round Minimal None         Visual Fields (Counting fingers)       Left Right   Restrictions Partial outer inferior temporal deficiency Total superior temporal, inferior nasal deficiencies         Extraocular Movement       Right Left    Full Full         Neuro/Psych     Oriented x3: Yes   Mood/Affect: Normal         Dilation     Both eyes: 1.0% Mydriacyl, 2.5% Phenylephrine @ 9:59 AM           Slit Lamp and Fundus Exam     Slit Lamp Exam       Right Left   Lids/Lashes Dermato Dermato   Conjunctiva/Sclera Melanosis, very attenuated vessels, trace injection  Melanosis   Cornea Arcus, well healed cataract wound, 2-3+ PEE 2-3+ inferior PEE, well healed temporal cataract wounds   Anterior Chamber Deep; 0.5+ fine cell/pigment Deep and quiet   Iris Round and poorly dilated to 4.14m,  NVI fully regressed  Round and dilated, No NVI   Lens PC IOL in good position, elschnig pearls ST PCIOL   Vitreous Post vitrectomy.  Residual anterior vitreous  syneresis.  +RBC - improved, +pigment Mild asteroid hyalosis, syneresis         Fundus Exam       Right Left   Disc 3-4+pallor. Sharp rim Hazy view, mild pallor, sharp rim   C/D Ratio 0.7 0.5   Macula Blunted foveal reflex. atrophic; ischemic, no heme Flat, Blunted foveal reflex, No heme or edema   Vessels Attenuated, tortuous, severe midzonal attenuation and ischemia, sclerotic Attenuated, tortuous   Periphery Attached, Peripheral laser scars, good posterior fill in 360 Attached.  360 PRP            IMAGING AND PROCEDURES  Imaging and Procedures for 06/11/2021  OCT, Retina - OU - Both Eyes       Right Eye Quality was borderline. Central Foveal Thickness: 240. Progression has been stable. Findings include normal foveal contour, no IRF, no SRF, outer retinal atrophy, epiretinal membrane, inner retinal atrophy, intraretinal hyper-reflective material (diffuse retinal atrophy -- nasal thickening gone, mild ERM).   Left Eye Quality was borderline. Central Foveal Thickness: 238. Progression has been stable. Findings include normal foveal contour, no SRF, epiretinal membrane, no IRF (Diffuse retinal atrophy/thinning; stable retinoschisis temporal midzone caught on widefield).   Notes *Images captured and stored on drive  Diagnosis / Impression:  OD: retinal ischemia / CRAO -- diffuse retinal atrophy -- nasal thickening stably improved, mild ERM OS: Diffuse retinal atrophy/thinning; retinoschisis temporal midzone caught on widefield -- stable  Clinical management:  See below  Abbreviations: NFP - Normal foveal profile. CME - cystoid macular edema. PED - pigment epithelial detachment. IRF - intraretinal fluid. SRF - subretinal fluid. EZ - ellipsoid zone. ERM - epiretinal membrane. ORA - outer retinal atrophy. ORT - outer retinal tubulation. SRHM - subretinal hyper-reflective material. IRHM - intraretinal hyper-reflective material     Intravitreal Injection, Pharmacologic Agent -  OD - Right Eye       Time Out 06/11/2021. 11:39 AM. Confirmed correct patient, procedure, site, and patient consented.   Anesthesia Topical anesthesia was used. Anesthetic medications included Lidocaine 2%, Proparacaine 0.5%.   Procedure Preparation included 5% betadine to ocular surface, eyelid speculum. A supplied needle was used.   Injection: 1.25 mg Bevacizumab 1.257m0.05ml   Route: Intravitreal, Site: Right Eye   NDC: : 25852-778-24Lot: [id number]<\/[id number] Expiration date: 07/24/2021, Waste: 0 mL   Post-op Post injection exam found visual acuity of at least counting fingers, no retinal detachment. The patient tolerated the procedure well. There were no complications. The patient received written and verbal post procedure care education. Post injection medications were not given.            ASSESSMENT/PLAN:    ICD-10-CM   1. Vitreous hemorrhage, right eye (HCC)  H43.11 Intravitreal Injection, Pharmacologic Agent - OD - Right Eye    Bevacizumab (AVASTIN) SOLN 1.25 mg    2. Hyphema of right eye  H21.01     3. Proliferative diabetic retinopathy of both eyes without macular edema associated with type 2 diabetes mellitus (HCC)  E1N39.7673ntravitreal Injection, Pharmacologic Agent - OD - Right Eye    Bevacizumab (AVASTIN)  SOLN 1.25 mg    4. Retinal ischemia  H35.82     5. Retinal edema  H35.81 OCT, Retina - OU - Both Eyes    6. Sickle cell retinopathy without crisis ([place])  D57.1    H36     7. Essential hypertension  I10     8. Hypertensive retinopathy of both eyes  H35.033     9. Pseudophakia of both eyes  Z96.1     10. Bilateral dry eyes  H04.123      1-3. Proliferative diabetic retinopathy w/ severe retinal ischemia, no DME, OU (OD > OS)  - former pt of Dr. [name], last visit 03.04.21  - history of PPV w/ endolaser, FAX OD 6.11.13; PRP OS 2020  - s/p IVA OD #1 (07.29.21) as above for new VH and microhyphema, #2 (07.19.22), #3 (08.23.22) -- for NVI  - s/p PRP OD  (08.24.21), fill in (08.02.22)  - BCVA OD stable at CF; OS improved to 20/80 from 20/100 - OCT without diabetic macular edema, but diffuse atrophy, both eyes -- OD with RAO-like apprearance -- severe ischemia  - FA (08.12.21) shows severe vascular non-perfusion OD -- peripheral nonperfusion extends posteriorly just outside arcades   - exam and gonio showed new NVI and NVA OD on 7.19.22--regressed post IVA OD on 7.19.22  - fully regressed NVI today  - IOP OD 11 today --  continue brimonidine BID OD - continue PF TID OD per Dr. Herbert Deaner - recommend IVA OD #4 today, 10.04.22 for NVI - pt wishes to proceed - RBA of procedure discussed, questions answered - informed consent obtained and signed - see procedure note - F/u in 3-4 weeks, DFE, OCT  4,5. VH & microyphema OD secondary to PDR -- stably resolved  - s/p IVA OD #1 (07.29.21)  - VH and microhyphema stably resolved OD   6. History of Sickle Cell Retinopathy  - 360 PRP in place OU  - no active NV, confirmed on FA 8.12.21  7,8. Hypertensive retinopathy OU - discussed importance of tight BP control - monitor  9. Pseudophakia OU  - s/p CE/IOL OU (OD Jan 2021; OS Feb 2021  - IOL in good position, doing well  - monitor  10. Dry eyes OU - recommend artificial tears and lubricating ointment as needed  Ophthalmic Meds Ordered this visit:  Meds ordered this encounter  Medications   Bevacizumab (AVASTIN) SOLN 1.25 mg      Return for 3-4 weeks , DFE, OCT.  There are no Patient Instructions on file for this visit.  Explained the diagnoses, plan, and follow up with the patient and they expressed understanding.  Patient expressed understanding of the importance of proper follow up care.  This document serves as a record of services personally performed by Gardiner Sleeper, MD, PhD. It was created on their behalf by San Jetty. Owens Shark, OA an ophthalmic technician. The creation of this record is the provider's dictation and/or activities  during the visit.    Electronically signed by: San Jetty. Owens Shark, [place] 10.03.2022 10:28 PM   Gardiner Sleeper, M.D., Ph.D. Diseases & Surgery of the Retina and Vitreous [facility]  I have reviewed the above documentation for accuracy and completeness, and I agree with the above. Gardiner Sleeper, M.D., Ph.D. 06/11/21 10:28 PM   Abbreviations: M myopia (nearsighted); A astigmatism; H hyperopia (farsighted); P presbyopia; Mrx spectacle prescription;  CTL contact lenses; OD right eye; OS left eye; OU both eyes  XT exotropia; ET esotropia; PEK punctate epithelial keratitis; PEE punctate epithelial erosions; DES dry eye syndrome; MGD meibomian gland dysfunction; ATs artificial tears; PFAT's preservative free artificial tears; [place] nuclear sclerotic cataract; PSC posterior subcapsular cataract; ERM epi-retinal membrane; PVD posterior vitreous detachment; RD retinal detachment; DM diabetes mellitus; DR diabetic retinopathy; NPDR non-proliferative diabetic retinopathy; PDR proliferative diabetic retinopathy; CSME clinically significant macular edema; DME diabetic macular edema; dbh dot blot hemorrhages; CWS cotton wool spot; POAG primary open angle glaucoma; C/D cup-to-disc ratio; HVF humphrey visual field; GVF goldmann visual field; OCT optical coherence tomography; IOP intraocular pressure; BRVO Branch retinal vein occlusion; CRVO central retinal vein occlusion; CRAO central retinal artery occlusion; BRAO branch retinal artery occlusion; RT retinal tear; SB scleral buckle; PPV pars plana vitrectomy; VH Vitreous hemorrhage; PRP panretinal laser photocoagulation; IVK intravitreal kenalog; VMT vitreomacular traction; MH Macular hole;  NVD neovascularization of the disc; NVE neovascularization elsewhere; AREDS age related eye disease study; ARMD age related macular degeneration; POAG primary open angle glaucoma; EBMD epithelial/anterior basement membrane dystrophy; ACIOL anterior chamber  intraocular lens; IOL intraocular lens; PCIOL posterior chamber intraocular lens; Phaco/IOL phacoemulsification with intraocular lens placement; [place] photorefractive keratectomy; LASIK laser assisted in situ keratomileusis; HTN hypertension; DM diabetes mellitus; COPD chronic obstructive pulmonary disease

## 2021-06-11 ENCOUNTER — Other Ambulatory Visit: Payer: Self-pay

## 2021-06-11 ENCOUNTER — Ambulatory Visit (INDEPENDENT_AMBULATORY_CARE_PROVIDER_SITE_OTHER): Payer: Medicare Other | Admitting: Ophthalmology

## 2021-06-11 ENCOUNTER — Encounter (INDEPENDENT_AMBULATORY_CARE_PROVIDER_SITE_OTHER): Payer: Self-pay | Admitting: Ophthalmology

## 2021-06-11 DIAGNOSIS — H3581 Retinal edema: Secondary | ICD-10-CM

## 2021-06-11 DIAGNOSIS — H35033 Hypertensive retinopathy, bilateral: Secondary | ICD-10-CM

## 2021-06-11 DIAGNOSIS — Z961 Presence of intraocular lens: Secondary | ICD-10-CM

## 2021-06-11 DIAGNOSIS — H3582 Retinal ischemia: Secondary | ICD-10-CM | POA: Diagnosis not present

## 2021-06-11 DIAGNOSIS — H04123 Dry eye syndrome of bilateral lacrimal glands: Secondary | ICD-10-CM

## 2021-06-11 DIAGNOSIS — I10 Essential (primary) hypertension: Secondary | ICD-10-CM | POA: Diagnosis not present

## 2021-06-11 DIAGNOSIS — H36 Retinal disorders in diseases classified elsewhere: Secondary | ICD-10-CM

## 2021-06-11 DIAGNOSIS — H2101 Hyphema, right eye: Secondary | ICD-10-CM

## 2021-06-11 DIAGNOSIS — E113593 Type 2 diabetes mellitus with proliferative diabetic retinopathy without macular edema, bilateral: Secondary | ICD-10-CM | POA: Diagnosis not present

## 2021-06-11 DIAGNOSIS — H3689 Other retinal disorders in diseases classified elsewhere: Secondary | ICD-10-CM

## 2021-06-11 DIAGNOSIS — D571 Sickle-cell disease without crisis: Secondary | ICD-10-CM | POA: Diagnosis not present

## 2021-06-11 DIAGNOSIS — H4311 Vitreous hemorrhage, right eye: Secondary | ICD-10-CM

## 2021-06-11 MED ORDER — BEVACIZUMAB CHEMO INJECTION 1.25MG/0.05ML SYRINGE FOR KALEIDOSCOPE
1.2500 mg | INTRAVITREAL | Status: AC | PRN
  Administered 2021-06-11: 1.25 mg via INTRAVITREAL

## 2021-06-11 NOTE — Patient Instructions (Signed)
Visit Information  PATIENT GOALS:  Goals Addressed      Infection Prevented or Managed   On track    Timeframe:  Long-Range Goal Priority:  Medium Start Date:  10/29/20                           Expected End Date:  10/29/21     Follow Up Date: 08/21/21  Patient Goals: - follow up with Dr. Alen Blew in 3 months for repeat follow-up repeat protein studies as directed - contact Dr. Alen Blew or seek an in-person evaluation if the symptoms worsen or if the condition fails to improve as anticipated                      The patient verbalized understanding of instructions, educational materials, and care plan provided today and declined offer to receive copy of patient instructions, educational materials, and care plan.   Telephone follow up appointment with care management team member scheduled for: 08/21/21  Barb Merino, RN, BSN, CCM Care Management Coordinator [facility]  Direct Phone: [phone]

## 2021-06-11 NOTE — Chronic Care Management (AMB) (Signed)
Chronic Care Management   CCM RN Visit Note  06/04/2021 Name: [name] MRN: [id number] DOB: 1966/05/13  Subjective: Isaiah [name] is a 55 y.o. year old male who is a primary care patient of [name], MD. The care management team was consulted for assistance with disease management and care coordination needs.    Engaged with patient by telephone for follow up visit in response to provider referral for case management and/or care coordination services.   Consent to Services:  The patient was given information about Chronic Care Management services, agreed to services, and gave verbal consent prior to initiation of services.  Please see initial visit note for detailed documentation.   Patient agreed to services and verbal consent obtained.   Assessment: Review of patient past medical history, allergies, medications, health status, including review of consultants reports, laboratory and other test data, was performed as part of comprehensive evaluation and provision of chronic care management services.   SDOH (Social Determinants of Health) assessments and interventions performed:    CCM Care Plan  Allergies  Allergen Reactions   Codeine Swelling   Codeine Rash and Other (See Comments)    Unknown reaction (patient says it was more serious than just a rash, but he can't remember what happened)     Outpatient Encounter Medications as of 06/04/2021  Medication Sig Note   acetaminophen (TYLENOL) 325 MG tablet Take 1-2 tablets (325-650 mg total) by mouth every 4 (four) hours as needed for mild pain.    albuterol (PROAIR HFA) 108 (90 Base) MCG/ACT inhaler Inhale two puffs every 4-6 hours if needed for cough or wheeze (Patient taking differently: Inhale 2 puffs into the lungs every 4 (four) hours as needed for wheezing or shortness of breath. Inhale two puffs every 4-6 hours if needed for cough or wheeze)    Alcohol Swabs (SM ALCOHOL PREP) 70 % PADS     atorvastatin (LIPITOR) 20  MG tablet TAKE 1 TABLET BY MOUTH ONCE A DAY    AURYXIA 1 GM 210 MG(Fe) tablet Take 210 mg by mouth every evening.    brimonidine (ALPHAGAN) 0.2 % ophthalmic solution Place 1 drop into the right eye in the morning and at bedtime.    calcitRIOL (ROCALTROL) 0.25 MCG capsule Take 0.25 mcg by mouth daily.    Cetirizine HCl 10 MG CAPS Take 1 capsule by mouth daily.     Cholecalciferol (D3-1000 PO) Take 1 tablet by mouth See admin instructions. Take one tablet by mouth on Monday,Wednesday and Fridays 12/31/2020: M,w,F   clonazePAM (KLONOPIN) 0.5 MG tablet Take 1 tablet by mouth at bedtime. Also take 2 tablets in morning on Monday, Wednesday, Friday before dialysis    dexamethasone (DECADRON) 4 MG tablet Take 4 mg by mouth daily.    dextrose 50 % solution Dextrose 50%    ferric citrate (AURYXIA) 1 GM 210 MG(Fe) tablet Take 1 tablet by mouth 3 times a day with meals as directed    fluticasone furoate-vilanterol (BREO ELLIPTA) 200-25 MCG/INH AEPB INHALE 1 PUFF BY MOUTH INTO THE LUNGS AT THE SAME TIME EVERY DAY    FREESTYLE LITE test strip     Glucagon, rDNA, (GLUCAGON EMERGENCY) 1 MG KIT See admin instructions.    Insulin Glargine-yfgn (SEMGLEE, YFGN,) 100 UNIT/ML SOPN Inject 6 units subcutaneously once a day    Insulin Lispro (HUMALOG KWIKPEN [place]) Inject 2-4 Units into the skin 3 (three) times daily before meals. Sliding scale: 150-250 =2 units, 250 -350=3 units, 350 += 4  units    lacosamide (VIMPAT) 200 MG TABS tablet TAKE 1 TABLET TWICE A DAY EXCEPT ON DIALYSIS DAYS M-W-F GIVE 1 TAB IN AM, 1 TAB AFTER DIALYSIS, 1 TAB IN PM.    lamoTRIgine (LAMICTAL) 200 MG tablet TAKE 1 TABLET BY MOUTH 2 TIMES DAILY    Lancets (FREESTYLE) lancets     midodrine (PROAMATINE) 10 MG tablet Take 1 tablet (10 mg total) by mouth 2 (two) times daily.    multivitamin (RENA-VIT) TABS tablet Take 1 tablet by mouth at bedtime.    pantoprazole (PROTONIX) 40 MG tablet TAKE 1 TABLET BY MOUTH ONCE DAILY    prednisoLONE acetate (PRED  FORTE) 1 % ophthalmic suspension Instill 1 drop Right Eye 3 times a day    QUEtiapine (SEROQUEL) 25 MG tablet Take 1/2 tablet by mouth every evening    UNIFINE PENTIPS 31G X 8 MM MISC USE AS DIRECTED DAILY AFTER SUPPER    No facility-administered encounter medications on file as of 06/04/2021.    Patient Active Problem List   Diagnosis Date Noted   Olfactory hallucinations 01/26/2021   Dependence on renal dialysis ([place]) 01/26/2021   Colitis 03/15/2020   AMS (altered mental status) 89/21/1941   Acute metabolic encephalopathy 74/04/1447   Headache, unspecified 09/28/2019   Abnormality of gait 09/13/2019   Diabetes mellitus with end-stage renal disease ([place]) 08/07/2019   Benign hypertensive kidney disease with chronic kidney disease 08/07/2019   Hypothyroidism, unspecified 07/15/2019   Spastic hemiparesis ([place]) 07/12/2019   Dysphagia, oropharyngeal phase 07/05/2019   Allergy, unspecified, initial encounter 06/27/2019   PAF (paroxysmal atrial fibrillation) ([place])    Bacteremia    Labile blood pressure    Labile blood glucose    Controlled type 2 diabetes mellitus with hyperglycemia, without long-term current use of insulin ([place])    ESRD on dialysis ([place])    Right sided weakness    SDH (subdural hematoma)    Chronic intracranial subdural hematoma ([place]) 06/09/2019   Multiple myeloma not having achieved remission ([place]) 06/09/2019   ESRD on hemodialysis ([place]) 06/09/2019   Essential hypertension 06/09/2019   DM type 2 (diabetes mellitus, type 2) ([place]) 06/09/2019   Seizure disorder ([place]) 06/09/2019   Aspiration pneumonia ([place]) 06/09/2019   History of bacteremia 06/09/2019   Atrial fibrillation, chronic ([place]) 06/09/2019   Bilateral kidney stones 05/31/2019   Proteus infection 05/28/2019   History of restrictive pulmonary disease 04/07/2019   Multiple myeloma ([place]) 05/17/2018   Goals of care, counseling/discussion 05/17/2018   Partial idiopathic epilepsy with seizures of localized  onset, intractable, without status epilepticus ([place]) 11/02/2017   Iron deficiency anemia, unspecified 01/22/2017   COPD (chronic obstructive pulmonary disease) ([place]) 11/11/2016   CVA (cerebral vascular accident) ([place]) 11/11/2016   GERD (gastroesophageal reflux disease) 11/11/2016   GI bleed 11/11/2016   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   Obesity (BMI 30-39.9) 11/11/2016   Pulmonary hypertension ([place]) 11/11/2016   Patent foramen ovale 11/11/2016   Hyperparathyroidism, secondary renal ([place]) 11/11/2016   VTE (venous thromboembolism) 11/11/2016   Hypotension 11/11/2016   Systemic hypertension 11/11/2016   Seizure ([place]) 11/11/2016   Other seizures ([place]) 07/25/2016   Encounter for removal of sutures 05/27/2016   Ingrown nail 03/03/2016   Onychomycosis due to dermatophyte 03/03/2016   Other acquired hammer toe 03/03/2016   Hemiparesis affecting right side as late effect of cerebrovascular accident ([place]) 18/56/3149   Aphasia complicating stroke 70/26/3785   Seizures ([place]) 01/08/2016   History of ischemic left MCA stroke 01/08/2016  Right sided weakness 01/08/2016   Atrial fibrillation ([place]) 01/08/2016   Leukocytosis 01/08/2016   Benign essential HTN 01/08/2016   Anemia of chronic disease 01/08/2016   Controlled type 2 diabetes mellitus with diabetic nephropathy, without long-term current use of insulin ([place]) 01/08/2016   History of DVT (deep vein thrombosis) 01/08/2016   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   Hypocalcemia 12/26/2015   Long term (current) use of anticoagulants 11/07/2015   Aftercare including intermittent dialysis ([place]) 11/02/2015   Other specified coagulation defects ([place]) 68/34/1962   Complication of vascular dialysis catheter 11/02/2015   Diarrhea, unspecified 11/02/2015   Fever, unspecified 11/02/2015   Pruritus, unspecified 11/02/2015   Shortness of breath 11/02/2015   ESRD (end stage renal disease) on dialysis ([place])     Conditions to be  addressed/monitored: DM with ESRD, Multiple Myeoloma  Care Plan : Cancer Treatment Phase (Adult)  Updates made by Lynne Logan, RN since 06/04/2021 12:00 AM     Problem: High Risk for Infection   Priority: Medium     Long-Range Goal: Infection Prevented or Managed   Start Date: 10/29/2020  Expected End Date: 10/29/2021  Recent Progress: On track  Priority: Medium  Note:    CARE PLAN ENTRY (see longitudinal plan of care for additional care plan information)  Current Barriers:  Chronic Disease Management support and education needs related to DM with ESRD, Multiple Myeloma Nurse Case Manager Clinical Goal(s):  Patient will work with PCP care team and Oncology to address needs related to disease education and support for ongoing treatment of Multiple Myeloma  CCM RN CM Interventions:  06/04/21 completed successful outbound call with patient's wife Maryan Rued care team collaboration (see longitudinal plan of care) Evaluation of current treatment plan related to Multiple Myeloma and patient's adherence to plan as established by provider Determined patient continues to be under the care of Dr. Alen Blew with [facility]. Medical Oncology with most recent f/u completed on 06/04/21; Impression and Plan: 55 year old man with: 1.  Multiple myeloma diagnosed in 2019.  He has IgG kappa at this time. His disease status was updated at this time and treatment options were reviewed.  Protein studies obtained on February 12, 2021 were personally reviewed and continues to show complete response to therapy with normal quantitative immunoglobulins and no M spike detected.  His kappa to lambda free light chain ratio remains still elevated which likely indicate residual disease. Risks and benefits of resuming induction therapy versus resuming maintenance treatment versus active surveillance were discussed.  Reinstituting antimyeloma therapy upon progression of disease was discussed.   After discussion today, we opted to continue with active surveillance and reintroduce systemic therapy upon signs of progression. 2. Anemia: Related to renal failure without any evidence of bleeding.  No transfusion is needed at this time. 3.  Renal failure: Currently hemodialysis dependent without any recent complications. 4. Goals of care: Therapy remains palliative although aggressive measures are warranted given his recent pulm status and young age. 6. Follow-up: He will return in 3 months for repeat follow-up repeat protein studies. The patient was advised to call back or seek an in-person evaluation if the symptoms worsen or if the condition fails to improve as anticipated. Determined wife verbalizes having a good understanding of the patient's current plan of care related to his Cancer treatment and when to call the doctor if needed  Educated on importance to report fever or signs of infection promptly  Discussed plans with patient for ongoing care management  follow up and provided patient with direct contact information for care management team Patient Self Care Activities:  Self administers medications as prescribed Attends all scheduled provider appointments Calls pharmacy for medication refills Calls provider office for new concerns or questions Patient Goal:  - follow up with Dr. Alen Blew in 3 months for repeat follow-up repeat protein studies as directed - contact Dr. Alen Blew or seek an in-person evaluation if the symptoms worsen or if the condition fails to improve as anticipated  Next Scheduled Follow Up Date: 08/21/21     Plan:Telephone follow up appointment with care management team member scheduled for:  08/21/21  Barb Merino, RN, BSN, CCM Care Management Coordinator [facility]  Direct Phone: [phone]

## 2021-06-12 DIAGNOSIS — D631 Anemia in chronic kidney disease: Secondary | ICD-10-CM | POA: Diagnosis not present

## 2021-06-12 DIAGNOSIS — Z992 Dependence on renal dialysis: Secondary | ICD-10-CM | POA: Diagnosis not present

## 2021-06-12 DIAGNOSIS — Z23 Encounter for immunization: Secondary | ICD-10-CM | POA: Diagnosis not present

## 2021-06-12 DIAGNOSIS — N2581 Secondary hyperparathyroidism of renal origin: Secondary | ICD-10-CM | POA: Diagnosis not present

## 2021-06-12 DIAGNOSIS — N186 End stage renal disease: Secondary | ICD-10-CM | POA: Diagnosis not present

## 2021-06-13 ENCOUNTER — Other Ambulatory Visit: Payer: Self-pay

## 2021-06-13 ENCOUNTER — Ambulatory Visit (INDEPENDENT_AMBULATORY_CARE_PROVIDER_SITE_OTHER): Payer: 59 | Admitting: Neurology

## 2021-06-13 ENCOUNTER — Other Ambulatory Visit (HOSPITAL_COMMUNITY): Payer: Self-pay

## 2021-06-13 ENCOUNTER — Encounter: Payer: Self-pay | Admitting: Neurology

## 2021-06-13 VITALS — BP 134/81 | HR 79 | Ht 70.0 in | Wt 196.6 lb

## 2021-06-13 DIAGNOSIS — G40209 Localization-related (focal) (partial) symptomatic epilepsy and epileptic syndromes with complex partial seizures, not intractable, without status epilepticus: Secondary | ICD-10-CM

## 2021-06-13 DIAGNOSIS — F01518 Vascular dementia, unspecified severity, with other behavioral disturbance: Secondary | ICD-10-CM

## 2021-06-13 DIAGNOSIS — F22 Delusional disorders: Secondary | ICD-10-CM | POA: Diagnosis not present

## 2021-06-13 MED ORDER — LACOSAMIDE 200 MG PO TABS
ORAL_TABLET | ORAL | 5 refills | Status: DC
  Filled 2021-06-13: qty 90, fill #0
  Filled 2021-07-07: qty 90, 37d supply, fill #0
  Filled 2021-08-09: qty 68, 30d supply, fill #1
  Filled 2021-08-15: qty 60, 30d supply, fill #1
  Filled 2021-09-29: qty 60, 30d supply, fill #2
  Filled 2021-10-27: qty 60, 30d supply, fill #3

## 2021-06-13 MED ORDER — ESCITALOPRAM OXALATE 10 MG PO TABS
10.0000 mg | ORAL_TABLET | Freq: Every day | ORAL | 6 refills | Status: DC
  Filled 2021-06-13: qty 30, 30d supply, fill #0
  Filled 2021-07-07: qty 30, 30d supply, fill #1
  Filled 2021-07-31: qty 30, 30d supply, fill #2
  Filled 2021-09-04: qty 30, 30d supply, fill #3
  Filled 2021-09-29: qty 30, 30d supply, fill #4
  Filled 2021-11-02: qty 30, 30d supply, fill #5

## 2021-06-13 MED ORDER — LAMOTRIGINE 200 MG PO TABS
ORAL_TABLET | Freq: Two times a day (BID) | ORAL | 3 refills | Status: AC
  Filled 2021-06-13: qty 180, 90d supply, fill #0
  Filled 2021-09-29: qty 180, 90d supply, fill #1
  Filled 2021-12-22: qty 180, 90d supply, fill #2
  Filled 2022-03-02: qty 180, 90d supply, fill #3

## 2021-06-13 MED ORDER — CLONAZEPAM 0.5 MG PO TABS
ORAL_TABLET | ORAL | 5 refills | Status: DC
  Filled 2021-06-13: qty 60, 60d supply, fill #0
  Filled 2021-08-25: qty 60, 70d supply, fill #0
  Filled 2021-10-27: qty 60, 70d supply, fill #1

## 2021-06-13 NOTE — Progress Notes (Signed)
NEUROLOGY FOLLOW UP OFFICE NOTE  Isaiah [name] [facility] [place] [name] [id number] 04-08-1966  HISTORY OF PRESENT ILLNESS: I had the pleasure of seeing Isaiah [name] in follow-up in the neurology clinic on 06/13/2021. He is again accompanied by his wife Isaiah [name] who helps supplement the history today. The patient was last seen 3 months ago for seizures secondary to left MCA stroke in 1660, complicated by right subdural hematoma in 2020. It appears he has vascular cognitive impairment as well, with associated behavioral changes (paranoid delusions that his wife is cheating on him). MMSE 17/30 in 03/2021. He was started on Seroquel but refused to take it, he does not believe there is anything wrong with him and is convinced he knows what he hears and sees, stating "I see and hear him sneaking out the house, " "I know I saw him." He had a brain MRI without contrast in 03/2021 with no acute changes, there were areas of encephalomalacia and gliosis in the right frontal lobe, right parietooccipital region, left frontoparietal region and insular region, left occipital and temporal lobes, small chronic subdural fluid collection in the right cerebral convexity without mass effect. He has not had any seizures during HD since 05/2019 on clonazepam 10m prior to HD. He had a left temporal seizure during his EMU admission in 12/2020 when seizure medications were tapered. He continues on Vimpat 2045mBID and Lamotrigine 20036mID without side effects. [place] and did not need bedtime clonazepam dose while he was there. He did not take Seroquel because he was fine there, SylSunday Spillersports he took it for 2 weeks here and was sleeping longer, she would not give it on HD days. There was a little difference but not much of a change. While in [place],he was doing well with no issues with his family members, and was okay when he came back until yesterday after HD when he was not answering SylSunday Spillerse is angry today and states  that he was purposefully not answering her because he was angry, he said he saw her go out of the house and was calling her hurtful names. His mother is at home with them and he wants to move out and back to [place] states his mood is okay, "I know what I see." He did not like [facility]. He states he is doing exercises at home and wants to try to get out of using his walker.    History on Initial Assessment 10/29/2017: This is a pleasant 55 46 RH man with a history of diabetes, sickle-cell thalassemia, DVT and PE off Coumadin due to GI bleed, sleep apnea, MGUS, CKD on dialysis, and strokes in multiple vascular distributions in January 2017 with subsequent seizures. Stroke workup showed left M2 superior branch occlusion, TEE with PFO, hypercoagulation studies negative. He is now on aspirin for secondary stroke prevention. He made significant improvement with right-sided weakness and speech was more fluent. He had a seizure in April 2017 and was started on Vimpat 100m47mD. EEG reported diffuse slowing. Limited MRI brain showed multiple late subacute and chronic infarcts, and repeat imaging in May 2017 showed resolution with no new infarct. [place] and was found to be anemic with Hgb of 6.8, requiring multiple blood transfusions. Vimpat increased to 150mg38m. EEG showed mild intermittent generalized theta slowing. He had another seizure after dialysis in November 2017 and Dilantin 300mg 25my was added. His wife  did not want him to be on this long term, and was switched to Lamotrigine. He had another seizure in January 2018 a few days after Dilantin was tapered off. He had another seizure in October 2018 again after dialysis. The last seizure was on 10/19/17 again after dialysis. This was slightly different because he was belligerent and agitated and his wife had to calm him down. He is taking Vimpat 158m BID and Lamotrigine 1535mBID with no side  effects. He sometimes has a warning prior to the seizures. With the most recent seizure, he was standing at the kitchen table, recalls his wife picking him up, then coming to on the sofa. Seizures last 5-10 minutes, his wife describes staring off, unresponsive, then trembling/low amplitude shaking. He is tired after, with worsened right-sided weakness. He would be "pretty out of it" the next day. Majority of seizures occur right after dialysis, he would still be sitting in the dialysis unit, but the most recent seizure occurred at home an hour after dialysis. He reports BP is not an issue because his nephrologist has regulated his medications and BP is good most of the time.    He has occasional tremors in both legs, he has a right hand tremor noted in the office today. He noticed memory changes after his stroke, but does not think memory is worse after each seizure. He wonders about his short-term memory, sometimes he has to think which is his right or left side. He denies any olfactory/gustatory hallucinations, deja vu, rising epigastric sensation,myoclonic jerks.    Epilepsy Risk Factors:  Prior strokes in multiple vascular distributions. Otherwise he had a normal birth and early development.  There is no history of febrile convulsions, CNS infections such as meningitis/encephalitis, significant traumatic brain injury, neurosurgical procedures, or family history of seizures.   Diagnostic Data: I personally reviewed MRI brain without contrast done 01/2016 which showed left MCA infarct, right posterior temporal and occipital hemorrhagic infarct, left frontal infarct with remote blood products, remote hemorrhagic infarct in the more superior right frontal lobe, remote nonhemorrhagic lacunar infarcts in the left cerebellum.   48-hour EEG in Jan 2021 showed occasional left temporal slowing, no epileptiform discharges. Typical episode of confusion and weakness captured did not show epileptiform correlate.    EMU 12/2020: His wife continued to report episodes where he had a blank look but was out of it, having a hard time following instructions. He was admitted to the EMU from 12/31/20 to 01/04/21 where typical events were not captured. He did have a seizure with right gaze deviation followed by right head version and body turning to the right followed by left arm jerking, gaze deviation to the left that progressed to a convulsion. EEG showed left frontotemporal origin. There was one episode of confusion with no EEG change seen.   Prior AEDs: Dilantin   PAST MEDICAL HISTORY: Past Medical History:  Diagnosis Date   [place] ([place]   Anemia    Diabetic retinopathy ([facility] [place]   PDR OU   DM type 2 (diabetes mellitus, type 2) ([facility]   [facility]   Essential hypertension 06/09/2019   GIB (gastrointestinal bleeding)    Recurrent episodes- 09/2014, 09/2015 and 05/2016   Gout    History of recent blood transfusion 10/27/14   2 Units PRBC's   Hyperkalemia 07/2011   Hypertensive retinopathy    OU   Multiple myeloma ([facility]   Pulmonary embolism ([facility]11/2012; 09/27/2014  a. Bilat PE 07/2011 - unclear cause, tx with 6 months Coumadin.;    Seizure disorder ([place]) 06/09/2019   Sepsis ([place])    Sickle cell-thalassemia disease ([place])    a. Sickle cell trait.   Stroke ([place]) 09/2015   R-MCA, L-MCA, PCA and bilateral cerebellar complicated by DVT/PE   Subdural hematoma 05/2019    MEDICATIONS: Current Outpatient Medications on File Prior to Visit  Medication Sig Dispense Refill   acetaminophen (TYLENOL) 325 MG tablet Take 1-2 tablets (325-650 mg total) by mouth every 4 (four) hours as needed for mild pain.     acetaminophen (TYLENOL) 325 MG tablet Take by mouth.     acyclovir (ZOVIRAX) 400 MG tablet Take by mouth.     albuterol (PROAIR HFA) 108 (90 Base) MCG/ACT inhaler Inhale two puffs every 4-6 hours if needed for cough or wheeze (Patient taking differently: Inhale  2 puffs into the lungs every 4 (four) hours as needed for wheezing or shortness of breath. Inhale two puffs every 4-6 hours if needed for cough or wheeze) 6.7 g 2   Alcohol Swabs (SM ALCOHOL PREP) 70 % PADS      atorvastatin (LIPITOR) 20 MG tablet TAKE 1 TABLET BY MOUTH ONCE A DAY 90 tablet 2   AURYXIA 1 GM 210 MG(Fe) tablet Take 210 mg by mouth every evening.     calcitRIOL (ROCALTROL) 0.25 MCG capsule Take 0.25 mcg by mouth daily.     Cetirizine HCl 10 MG CAPS Take 1 capsule by mouth daily.      Cholecalciferol (D3-1000 PO) Take 1 tablet by mouth See admin instructions. Take one tablet by mouth on Monday,Wednesday and Fridays     Cinacalcet HCl (SENSIPAR PO) Take by mouth.     clonazePAM (KLONOPIN) 0.5 MG tablet Take 1 tablet by mouth at bedtime. Also take 2 tablets in morning on Monday, Wednesday, Friday before dialysis 60 tablet 5   ferric citrate (AURYXIA) 1 GM 210 MG(Fe) tablet Take 1 tablet by mouth 3 times a day with meals as directed 270 tablet 3   fluticasone (FLONASE) 50 MCG/ACT nasal spray Place into the nose.     fluticasone furoate-vilanterol (BREO ELLIPTA) 200-25 MCG/INH AEPB INHALE 1 PUFF BY MOUTH INTO THE LUNGS AT THE SAME TIME EVERY DAY 60 each 2   FREESTYLE LITE test strip      Glucagon, rDNA, (GLUCAGON EMERGENCY) 1 MG KIT See admin instructions.     hydrOXYzine (ATARAX/VISTARIL) 25 MG tablet Take by mouth as needed.     Insulin Glargine-yfgn (SEMGLEE, YFGN,) 100 UNIT/ML SOPN Inject 6 units subcutaneously once a day 9 mL 4   Insulin Lispro (HUMALOG KWIKPEN [place]) Inject 2-4 Units into the skin 3 (three) times daily before meals. Sliding scale: 150-250 =2 units, 250 -350=3 units, 350 += 4 units     lacosamide (VIMPAT) 200 MG TABS tablet TAKE 1 TABLET TWICE A DAY EXCEPT ON DIALYSIS DAYS M-W-F GIVE 1 TAB IN AM, 1 TAB AFTER DIALYSIS, 1 TAB IN PM. 90 tablet 5   lamoTRIgine (LAMICTAL) 200 MG tablet TAKE 1 TABLET BY MOUTH 2 TIMES DAILY 60 tablet 11   lamoTRIgine 200 MG TBDP Take by mouth.      Lancets (FREESTYLE) lancets      midodrine (PROAMATINE) 10 MG tablet Take 1 tablet (10 mg total) by mouth 2 (two) times daily. 180 tablet 1   multivitamin (RENA-VIT) TABS tablet Take 1 tablet by mouth at bedtime.     pantoprazole (PROTONIX) 40 MG tablet TAKE  1 TABLET BY MOUTH ONCE DAILY 90 tablet 1   prochlorperazine (COMPAZINE) 10 MG tablet Take by mouth.     QUEtiapine (SEROQUEL) 25 MG tablet Take 1/2 tablet by mouth every evening 15 tablet 6   UNIFINE PENTIPS 31G X 8 MM MISC USE AS DIRECTED DAILY AFTER SUPPER 100 each 3   Methoxy PEG-Epoetin Beta (MIRCERA IJ) Mircera (Patient not taking: Reported on 06/13/2021)     metoprolol succinate (TOPROL-XL) 25 MG 24 hr tablet Take by mouth. (Patient not taking: Reported on 06/13/2021)     No current facility-administered medications on file prior to visit.    ALLERGIES: Allergies  Allergen Reactions   Codeine Swelling   Codeine Rash and Other (See Comments)    Unknown reaction (patient says it was more serious than just a rash, but he can't remember what happened)     FAMILY HISTORY: Family History  Problem Relation Age of Onset   Hypertension Mother    Hypertension Father    Stroke Father    Sickle cell anemia Brother    Diabetes Mother    Sickle cell anemia Brother    Diabetes type I Brother    Kidney disease Brother     SOCIAL HISTORY: Social History   Socioeconomic History   Marital status: Married    Spouse name: Isaiah [name]   Number of children: 1   Years of education: college   Highest education level: Not on file  Occupational History   Occupation: [employer]   Occupation: disability  Tobacco Use   Smoking status: Never   Smokeless tobacco: Never  Vaping Use   Vaping Use: Never used  Substance and Sexual Activity   Alcohol use: Not Currently   Drug use: Never   Sexual activity: Yes    Partners: Female  Other Topics Concern   Not on file  Social History Narrative   ** Merged History Encounter **        Pt lives in 2 story home with his wife and 1 son   Has masters degree in psychology   Currently disabled.   Right handed       Social Determinants of Health   Financial Resource Strain: Low Risk    Difficulty of Paying Living Expenses: Not hard at all  Food Insecurity: No Food Insecurity   Worried About Charity fundraiser in the Last Year: Never true   Ran Out of Food in the Last Year: Never true  Transportation Needs: No Transportation Needs   Lack of Transportation (Medical): No   Lack of Transportation (Non-Medical): No  Physical Activity: Sufficiently Active   Days of Exercise per Week: 3 days   Minutes of Exercise per Session: 60 min  Stress: No Stress Concern Present   Feeling of Stress : Only a little  Social Connections: Not on file  Intimate Partner Violence: Not At Risk   Fear of Current or Ex-Partner: No   Emotionally Abused: No   Physically Abused: No   Sexually Abused: No     PHYSICAL EXAM: Vitals:   06/13/21 0957  BP: 134/81  Pulse: 79  SpO2: 96%   General: No acute distress, sitting on wheelchair. Head:  Normocephalic/atraumatic Skin/Extremities: No rash, no edema Neurological Exam: alert and awake. No aphasia or dysarthria. Fund of knowledge is reduced, speaks calmly but angry about paranoid delusions of wife.  Attention and concentration are normal.   Cranial nerves: Pupils equal, round. Extraocular movements intact with no nystagmus. Visual fields full.  No facial asymmetry.  Motor: Bulk and tone normal, muscle strength 5/5 throughout with no pronator drift, right 2nd digit flexed.   Finger to nose testing intact.  Gait not tested.   IMPRESSION: This is a pleasant 55 yo RH man with a history of  diabetes, sickle-cell thalassemia, DVT and PE off Coumadin due to GI bleed, sleep apnea, CKD on dialysis, strokes in multiple vascular distributions in January 2017 with subsequent seizures. He had a complicated course while preparing for stem cell  transplant for multiple myeloma, found to have a nontraumatic right subdural hematoma in 05/2019. The convulsive seizures occurring during dialysis have not occurred since clonazepam 22m prior to HD was started in 05/2019. Main concern are the paranoid delusions directed towards his wife (and sometimes son).Repeat MRI brain no acute changes, but there are strokes in bilateral hemispheres, MMSE 17/30 in 03/2021, symptoms concerning for vascular dementia with behavioral disturbance. He will be scheduled for Neuropsychological evaluation. We discussed starting Lexapro 138mdaily, side effects discussed. He does not think he needs Seroquel since "he is not crazy." Continue Vimpat 20035mID, Lamotrigine 200m67mD, and clonazepam 1mg 79mor to HD. Follow-up in 3 months, call for any changes.    Thank you for allowing me to participate in his care.  Please do not hesitate to call for any questions or concerns.    KarenEllouise Newer.   CC: Dr. SandeBaird Cancer

## 2021-06-13 NOTE — Patient Instructions (Addendum)
Schedule Neuropsychological evaluation  2. Start Lexapro 21m daily. Please continue taking this until your next visit with me  3. Continue all your other medications  4. Follow-up in 3 months, call for any changes   You have been referred for a neuropsychological evaluation (i.e., evaluation of memory and thinking abilities). Please bring someone with you to this appointment if possible, as it is helpful for the doctor to hear from both you and another adult who knows you well. Please bring eyeglasses and hearing aids if you wear them.    The evaluation will take approximately 3 hours and has two parts:   The first part is a clinical interview with the neuropsychologist (Dr. MMelvyn Novasor Dr. SNicole Kindred. During the interview, the neuropsychologist will speak with you and the individual you brought to the appointment.    The second part of the evaluation is testing with the doctor's technician (Hinton Dyeror KMaudie Mercury. During the testing, the technician will ask you to remember different types of material, solve problems, and answer some questionnaires. Your family member will not be present for this portion of the evaluation.   Please note: We must reserve several hours of the neuropsychologist's time and the psychometrician's time for your evaluation appointment. As such, there is a No-Show fee of $100. If you are unable to attend any of your appointments, please contact our office as soon as possible to reschedule.

## 2021-06-17 ENCOUNTER — Telehealth: Payer: Self-pay | Admitting: Neurology

## 2021-06-17 DIAGNOSIS — N2581 Secondary hyperparathyroidism of renal origin: Secondary | ICD-10-CM | POA: Diagnosis not present

## 2021-06-17 DIAGNOSIS — D631 Anemia in chronic kidney disease: Secondary | ICD-10-CM | POA: Diagnosis not present

## 2021-06-17 DIAGNOSIS — Z992 Dependence on renal dialysis: Secondary | ICD-10-CM | POA: Diagnosis not present

## 2021-06-17 DIAGNOSIS — N186 End stage renal disease: Secondary | ICD-10-CM | POA: Diagnosis not present

## 2021-06-17 DIAGNOSIS — Z23 Encounter for immunization: Secondary | ICD-10-CM | POA: Diagnosis not present

## 2021-06-17 NOTE — Telephone Encounter (Signed)
Patient wife called and a VM stating that she was returning Dr Delice Lesch call from Friday

## 2021-06-17 NOTE — Telephone Encounter (Signed)
Spoke to wife. He refused to take the Lexapro but wife has been able to add to his medications, he has been sleeping a lot since starting it 3 days ago. Can try giving at night. Reassured her and discussed her husband's delusions. He is scheduled for Neuropsych in March, will ask to be put on cancellation list.  Hi Hinton Dyer, can you pls add him to Neuropsych cancellation list. Thank you!

## 2021-06-18 ENCOUNTER — Other Ambulatory Visit (HOSPITAL_BASED_OUTPATIENT_CLINIC_OR_DEPARTMENT_OTHER): Payer: Self-pay

## 2021-06-18 NOTE — Telephone Encounter (Signed)
Pt has been Added to the wait list

## 2021-06-19 DIAGNOSIS — D631 Anemia in chronic kidney disease: Secondary | ICD-10-CM | POA: Diagnosis not present

## 2021-06-19 DIAGNOSIS — N186 End stage renal disease: Secondary | ICD-10-CM | POA: Diagnosis not present

## 2021-06-19 DIAGNOSIS — N2581 Secondary hyperparathyroidism of renal origin: Secondary | ICD-10-CM | POA: Diagnosis not present

## 2021-06-19 DIAGNOSIS — Z992 Dependence on renal dialysis: Secondary | ICD-10-CM | POA: Diagnosis not present

## 2021-06-19 DIAGNOSIS — E1129 Type 2 diabetes mellitus with other diabetic kidney complication: Secondary | ICD-10-CM | POA: Diagnosis not present

## 2021-06-19 DIAGNOSIS — Z23 Encounter for immunization: Secondary | ICD-10-CM | POA: Diagnosis not present

## 2021-06-20 ENCOUNTER — Telehealth: Payer: Self-pay | Admitting: *Deleted

## 2021-06-20 ENCOUNTER — Other Ambulatory Visit: Payer: Self-pay

## 2021-06-20 ENCOUNTER — Inpatient Hospital Stay: Payer: Medicare Other | Attending: Oncology

## 2021-06-20 DIAGNOSIS — N189 Chronic kidney disease, unspecified: Secondary | ICD-10-CM | POA: Diagnosis not present

## 2021-06-20 DIAGNOSIS — M2042 Other hammer toe(s) (acquired), left foot: Secondary | ICD-10-CM | POA: Diagnosis not present

## 2021-06-20 DIAGNOSIS — B351 Tinea unguium: Secondary | ICD-10-CM | POA: Diagnosis not present

## 2021-06-20 DIAGNOSIS — D631 Anemia in chronic kidney disease: Secondary | ICD-10-CM | POA: Insufficient documentation

## 2021-06-20 DIAGNOSIS — C9000 Multiple myeloma not having achieved remission: Secondary | ICD-10-CM | POA: Insufficient documentation

## 2021-06-20 DIAGNOSIS — Z992 Dependence on renal dialysis: Secondary | ICD-10-CM | POA: Insufficient documentation

## 2021-06-20 DIAGNOSIS — M2041 Other hammer toe(s) (acquired), right foot: Secondary | ICD-10-CM | POA: Diagnosis not present

## 2021-06-20 DIAGNOSIS — E1065 Type 1 diabetes mellitus with hyperglycemia: Secondary | ICD-10-CM | POA: Diagnosis not present

## 2021-06-20 LAB — CBC WITH DIFFERENTIAL (CANCER CENTER ONLY)
Abs Immature Granulocytes: 0.03 10*3/uL (ref 0.00–0.07)
Basophils Absolute: 0.1 10*3/uL (ref 0.0–0.1)
Basophils Relative: 1 %
Eosinophils Absolute: 0.8 10*3/uL — ABNORMAL HIGH (ref 0.0–0.5)
Eosinophils Relative: 9 %
HCT: 30.1 % — ABNORMAL LOW (ref 39.0–52.0)
Hemoglobin: 11.2 g/dL — ABNORMAL LOW (ref 13.0–17.0)
Immature Granulocytes: 0 %
Lymphocytes Relative: 15 %
Lymphs Abs: 1.3 10*3/uL (ref 0.7–4.0)
MCH: 32.1 pg (ref 26.0–34.0)
MCHC: 37.2 g/dL — ABNORMAL HIGH (ref 30.0–36.0)
MCV: 86.2 fL (ref 80.0–100.0)
Monocytes Absolute: 1.2 10*3/uL — ABNORMAL HIGH (ref 0.1–1.0)
Monocytes Relative: 13 %
Neutro Abs: 5.5 10*3/uL (ref 1.7–7.7)
Neutrophils Relative %: 62 %
Platelet Count: 316 10*3/uL (ref 150–400)
RBC: 3.49 MIL/uL — ABNORMAL LOW (ref 4.22–5.81)
RDW: 13.8 % (ref 11.5–15.5)
WBC Count: 8.9 10*3/uL (ref 4.0–10.5)
nRBC: 1.6 % — ABNORMAL HIGH (ref 0.0–0.2)

## 2021-06-20 LAB — CMP (CANCER CENTER ONLY)
ALT: 13 U/L (ref 0–44)
AST: 17 U/L (ref 15–41)
Albumin: 4.5 g/dL (ref 3.5–5.0)
Alkaline Phosphatase: 85 U/L (ref 38–126)
Anion gap: 15 (ref 5–15)
BUN: 35 mg/dL — ABNORMAL HIGH (ref 6–20)
CO2: 30 mmol/L (ref 22–32)
Calcium: 9 mg/dL (ref 8.9–10.3)
Chloride: 94 mmol/L — ABNORMAL LOW (ref 98–111)
Creatinine: 8.34 mg/dL (ref 0.61–1.24)
GFR, Estimated: 7 mL/min — ABNORMAL LOW (ref >60)
Glucose, Bld: 105 mg/dL — ABNORMAL HIGH (ref 70–99)
Potassium: 3.9 mmol/L (ref 3.5–5.1)
Sodium: 139 mmol/L (ref 135–145)
Total Bilirubin: 1.1 mg/dL (ref 0.3–1.2)
Total Protein: 7.9 g/dL (ref 6.5–8.1)

## 2021-06-20 NOTE — Progress Notes (Unsigned)
CRITICAL CREATININE RESULT OF 8.34 CALLED TO, READ BACK BY AND VERIFIED WITH Shelle Iron RN @ [facility] ON [phone]

## 2021-06-20 NOTE — Telephone Encounter (Signed)
CRITICAL VALUE STICKER  CRITICAL VALUE: Creatinine 8.34  RECEIVER (on-site recipient of call):Kim, RN/ Sandi, RN  DATE & TIME NOTIFIED: 06/20/21;1102  MESSENGER (representative from lab):Jay  MD NOTIFIED: Dr. Alen Blew  TIME OF NOTIFICATION:1127  RESPONSE: Information acknowledged-no orders received

## 2021-06-21 ENCOUNTER — Encounter: Payer: Self-pay | Admitting: Internal Medicine

## 2021-06-21 DIAGNOSIS — N186 End stage renal disease: Secondary | ICD-10-CM | POA: Diagnosis not present

## 2021-06-21 DIAGNOSIS — Z23 Encounter for immunization: Secondary | ICD-10-CM | POA: Diagnosis not present

## 2021-06-21 DIAGNOSIS — Z992 Dependence on renal dialysis: Secondary | ICD-10-CM | POA: Diagnosis not present

## 2021-06-21 DIAGNOSIS — D631 Anemia in chronic kidney disease: Secondary | ICD-10-CM | POA: Diagnosis not present

## 2021-06-21 DIAGNOSIS — N2581 Secondary hyperparathyroidism of renal origin: Secondary | ICD-10-CM | POA: Diagnosis not present

## 2021-06-21 LAB — KAPPA/LAMBDA LIGHT CHAINS
Kappa free light chain: 331.6 mg/L — ABNORMAL HIGH (ref 3.3–19.4)
Kappa, lambda light chain ratio: 2.72 — ABNORMAL HIGH (ref 0.26–1.65)
Lambda free light chains: 121.8 mg/L — ABNORMAL HIGH (ref 5.7–26.3)

## 2021-06-24 DIAGNOSIS — D631 Anemia in chronic kidney disease: Secondary | ICD-10-CM | POA: Diagnosis not present

## 2021-06-24 DIAGNOSIS — Z23 Encounter for immunization: Secondary | ICD-10-CM | POA: Diagnosis not present

## 2021-06-24 DIAGNOSIS — Z992 Dependence on renal dialysis: Secondary | ICD-10-CM | POA: Diagnosis not present

## 2021-06-24 DIAGNOSIS — N2581 Secondary hyperparathyroidism of renal origin: Secondary | ICD-10-CM | POA: Diagnosis not present

## 2021-06-24 DIAGNOSIS — N186 End stage renal disease: Secondary | ICD-10-CM | POA: Diagnosis not present

## 2021-06-24 LAB — MULTIPLE MYELOMA PANEL, SERUM
Albumin SerPl Elph-Mcnc: 4.1 g/dL (ref 2.9–4.4)
Albumin/Glob SerPl: 1.4 (ref 0.7–1.7)
Alpha 1: 0.3 g/dL (ref 0.0–0.4)
Alpha2 Glob SerPl Elph-Mcnc: 0.8 g/dL (ref 0.4–1.0)
B-Globulin SerPl Elph-Mcnc: 0.8 g/dL (ref 0.7–1.3)
Gamma Glob SerPl Elph-Mcnc: 1.1 g/dL (ref 0.4–1.8)
Globulin, Total: 3.1 g/dL (ref 2.2–3.9)
IgA: 82 mg/dL — ABNORMAL LOW (ref 90–386)
IgG (Immunoglobin G), Serum: 1150 mg/dL (ref 603–1613)
IgM (Immunoglobulin M), Srm: 29 mg/dL (ref 20–172)
Total Protein ELP: 7.2 g/dL (ref 6.0–8.5)

## 2021-06-26 DIAGNOSIS — D631 Anemia in chronic kidney disease: Secondary | ICD-10-CM | POA: Diagnosis not present

## 2021-06-26 DIAGNOSIS — N186 End stage renal disease: Secondary | ICD-10-CM | POA: Diagnosis not present

## 2021-06-26 DIAGNOSIS — N2581 Secondary hyperparathyroidism of renal origin: Secondary | ICD-10-CM | POA: Diagnosis not present

## 2021-06-26 DIAGNOSIS — Z992 Dependence on renal dialysis: Secondary | ICD-10-CM | POA: Diagnosis not present

## 2021-06-26 DIAGNOSIS — Z23 Encounter for immunization: Secondary | ICD-10-CM | POA: Diagnosis not present

## 2021-06-27 ENCOUNTER — Other Ambulatory Visit: Payer: 59

## 2021-06-27 ENCOUNTER — Inpatient Hospital Stay (HOSPITAL_BASED_OUTPATIENT_CLINIC_OR_DEPARTMENT_OTHER): Payer: Medicare Other | Admitting: Oncology

## 2021-06-27 ENCOUNTER — Other Ambulatory Visit: Payer: Self-pay

## 2021-06-27 VITALS — BP 131/54 | HR 69 | Temp 98.0°F | Resp 18 | Ht 70.0 in | Wt 186.1 lb

## 2021-06-27 DIAGNOSIS — C9000 Multiple myeloma not having achieved remission: Secondary | ICD-10-CM

## 2021-06-27 DIAGNOSIS — D631 Anemia in chronic kidney disease: Secondary | ICD-10-CM | POA: Diagnosis not present

## 2021-06-27 DIAGNOSIS — Z992 Dependence on renal dialysis: Secondary | ICD-10-CM | POA: Diagnosis not present

## 2021-06-27 DIAGNOSIS — N189 Chronic kidney disease, unspecified: Secondary | ICD-10-CM | POA: Diagnosis not present

## 2021-06-27 NOTE — Progress Notes (Signed)
Hematology and Oncology Follow Up Visit  [name] [id number] 28-Jul-1966 55 y.o. 06/27/2021 11:46 AM    Principle Diagnosis: 55 year old man with IgG kappa multiple myeloma diagnosed in 2019 arising from MGUS.   Past therapy: Velcade dexamethasone and cyclophosphamide weekly started on 05/25/2018.  Velcade is given at 1.5 mg/m weekly, dexamethasone is 20 mg weekly and and Cytoxan is given at 650 mg total dose.  Last treatment was given in August 2020.    Therapy was held today in anticipation of high-dose chemotherapy and autologous stem cell transplant.  He did not receive stem cell transplant due to subdural hematoma and sepsis in September 2020.  Velcade at 1.5 mg per metered squared subcutaneously weekly, dexamethasone 20 mg weekly, daratumumab 1800 mg subcutaneous injection.   Cycle 1 started on 08/30/2019.  Therapy concluded in June 2021.   Daratumumab monthly maintenance started in June 2021.  Therapy discontinued in December 2021.  Current therapy: Active surveillance.  Interim History:  Isaiah [name] is here for a follow-up evaluation.  Since the last visit, he reports feeling well without any major complaints.  He denies any nausea, vomiting or abdominal pain.  His mobility is still limited but improving.  He is using a walker and occasionally canes.  He was able to travel to [facility] and visit with his mother.  He denies any bone pain or pathological fractures.  He denies any falls or syncope.  Continues to be dialysis dependent without any decline.                     Medications: Unchanged on review    Current Outpatient Medications:    acetaminophen (TYLENOL) 325 MG tablet, Take 1-2 tablets (325-650 mg total) by mouth every 4 (four) hours as needed for mild pain., Disp:  , Rfl:    acetaminophen (TYLENOL) 325 MG tablet, Take by mouth., Disp: , Rfl:    acyclovir (ZOVIRAX) 400 MG tablet, Take by mouth., Disp: , Rfl:    albuterol (PROAIR HFA) 108 (90 Base)  MCG/ACT inhaler, Inhale two puffs every 4-6 hours if needed for cough or wheeze (Patient taking differently: Inhale 2 puffs into the lungs every 4 (four) hours as needed for wheezing or shortness of breath. Inhale two puffs every 4-6 hours if needed for cough or wheeze), Disp: 6.7 g, Rfl: 2   Alcohol Swabs (SM ALCOHOL PREP) 70 % PADS, , Disp: , Rfl:    atorvastatin (LIPITOR) 20 MG tablet, TAKE 1 TABLET BY MOUTH ONCE A DAY, Disp: 90 tablet, Rfl: 2   AURYXIA 1 GM 210 MG(Fe) tablet, Take 210 mg by mouth every evening., Disp: , Rfl:    calcitRIOL (ROCALTROL) 0.25 MCG capsule, Take 0.25 mcg by mouth daily., Disp: , Rfl:    Cetirizine HCl 10 MG CAPS, Take 1 capsule by mouth daily. , Disp: , Rfl:    Cholecalciferol (D3-1000 PO), Take 1 tablet by mouth See admin instructions. Take one tablet by mouth on Monday,Wednesday and Fridays, Disp: , Rfl:    Cinacalcet HCl (SENSIPAR PO), Take by mouth., Disp: , Rfl:    clonazePAM (KLONOPIN) 0.5 MG tablet, Take 2 tablets in morning on Monday, Wednesday, Friday before dialysis, Disp: 60 tablet, Rfl: 5   escitalopram (LEXAPRO) 10 MG tablet, Take 1 tablet (10 mg total) by mouth daily., Disp: 30 tablet, Rfl: 6   ferric citrate (AURYXIA) 1 GM 210 MG(Fe) tablet, Take 1 tablet by mouth 3 times a day with meals as directed, Disp: 270 tablet,  Rfl: 3   fluticasone (FLONASE) 50 MCG/ACT nasal spray, Place into the nose., Disp: , Rfl:    fluticasone furoate-vilanterol (BREO ELLIPTA) 200-25 MCG/INH AEPB, INHALE 1 PUFF BY MOUTH INTO THE LUNGS AT THE SAME TIME EVERY DAY, Disp: 60 each, Rfl: 2   FREESTYLE LITE test strip, , Disp: , Rfl:    Glucagon, rDNA, (GLUCAGON EMERGENCY) 1 MG KIT, See admin instructions., Disp: , Rfl:    hydrOXYzine (ATARAX/VISTARIL) 25 MG tablet, Take by mouth as needed., Disp: , Rfl:    Insulin Glargine-yfgn (SEMGLEE, YFGN,) 100 UNIT/ML SOPN, Inject 6 units subcutaneously once a day, Disp: 9 mL, Rfl: 4   Insulin Lispro (HUMALOG KWIKPEN ), Inject 2-4 Units  into the skin 3 (three) times daily before meals. Sliding scale: 150-250 =2 units, 250 -350=3 units, 350 += 4 units, Disp: , Rfl:    lacosamide (VIMPAT) 200 MG TABS tablet, TAKE 1 TABLET TWICE A DAY EXCEPT ON DIALYSIS DAYS M-W-F GIVE 1 TAB IN AM, 1 TAB AFTER DIALYSIS, 1 TAB IN PM., Disp: 90 tablet, Rfl: 5   lamoTRIgine (LAMICTAL) 200 MG tablet, TAKE 1 TABLET BY MOUTH 2 TIMES DAILY, Disp: 180 tablet, Rfl: 3   Lancets (FREESTYLE) lancets, , Disp: , Rfl:    Methoxy PEG-Epoetin Beta (MIRCERA IJ), Mircera (Patient not taking: Reported on 06/13/2021), Disp: , Rfl:    metoprolol succinate (TOPROL-XL) 25 MG 24 hr tablet, Take by mouth. (Patient not taking: Reported on 06/13/2021), Disp: , Rfl:    midodrine (PROAMATINE) 10 MG tablet, Take 1 tablet (10 mg total) by mouth 2 (two) times daily., Disp: 180 tablet, Rfl: 1   multivitamin (RENA-VIT) TABS tablet, Take 1 tablet by mouth at bedtime., Disp: , Rfl:    pantoprazole (PROTONIX) 40 MG tablet, TAKE 1 TABLET BY MOUTH ONCE DAILY, Disp: 90 tablet, Rfl: 1   prochlorperazine (COMPAZINE) 10 MG tablet, Take by mouth., Disp: , Rfl:    UNIFINE PENTIPS 31G X 8 MM MISC, USE AS DIRECTED DAILY AFTER SUPPER, Disp: 100 each, Rfl: 3  Allergies:  Allergies  Allergen Reactions   Codeine Swelling   Codeine Rash and Other (See Comments)    Unknown reaction (patient says it was more serious than just a rash, but he can't remember what happened)       Physical Exam:        Blood pressure (!) 131/54, pulse 69, temperature 98 F (36.7 C), temperature source Oral, resp. rate 18, height 5' 10"  (1.778 m), weight 186 lb 1.6 oz (84.4 kg), SpO2 99 %.          ECOG: 1    General appearance: Comfortable appearing without any discomfort Head: Normocephalic without any trauma Oropharynx: Mucous membranes are moist and pink without any thrush or ulcers. Eyes: Pupils are equal and round reactive to light. Lymph nodes: No cervical, supraclavicular, inguinal or  axillary lymphadenopathy.   Heart:regular rate and rhythm.  S1 and S2 without leg edema. Lung: Clear without any rhonchi or wheezes.  No dullness to percussion. Abdomin: Soft, nontender, nondistended with good bowel sounds.  No hepatosplenomegaly. Musculoskeletal: No joint deformity or effusion.  Full range of motion noted. Neurological: No deficits noted on motor, sensory and deep tendon reflex exam. Skin: No petechial rash or dryness.  Appeared moist.                       Lab Results: Lab Results  Component Value Date   WBC 8.9 06/20/2021   HGB 11.2 (  L) 06/20/2021   HCT 30.1 (L) 06/20/2021   MCV 86.2 06/20/2021   PLT 316 06/20/2021     Chemistry      Component Value Date/Time   NA 139 06/20/2021 0934   NA 141 07/19/2020 0000   NA 138 07/03/2017 0813   K 3.9 06/20/2021 0934   K 4.2 07/03/2017 0813   CL 94 (L) 06/20/2021 0934   CO2 30 06/20/2021 0934   CO2 27 07/03/2017 0813   BUN 35 (H) 06/20/2021 0934   BUN 65 (A) 08/15/2020 0000   BUN 46.4 (H) 07/03/2017 0813   CREATININE 8.34 (HH) 06/20/2021 0934   CREATININE 10.9 (HH) 07/03/2017 0813   GLU 162 08/15/2020 0000      Component Value Date/Time   CALCIUM 9.0 06/20/2021 0934   CALCIUM 9.3 07/03/2017 0813   ALKPHOS 85 06/20/2021 0934   ALKPHOS 77 07/03/2017 0813   AST 17 06/20/2021 0934   AST 16 07/03/2017 0813   ALT 13 06/20/2021 0934   ALT 14 07/03/2017 0813   BILITOT 1.1 06/20/2021 0934   BILITOT 0.93 07/03/2017 0813      Results for Isaiah, [name] (MRN [id number]) as of 06/27/2021 11:35  Ref. Range 06/20/2021 09:34  Kappa free light chain Latest Ref Range: 3.3 - 19.4 mg/L 331.6 (H)  Lambda free light chains Latest Ref Range: 5.7 - 26.3 mg/L 121.8 (H)  Kappa, lambda light chain ratio Latest Ref Range: 0.26 - 1.65  2.72 (H)    Results for SUBHAN, HOOPES (MRN [id number]) as of 06/27/2021 11:35  Ref. Range 02/12/2021 11:58 06/20/2021 09:34  M Protein SerPl Elph-Mcnc Latest Ref Range: Not  Observed g/dL Not Observed Not Observed  IFE 1 Unknown Comment Comment  Globulin, Total Latest Ref Range: 2.2 - 3.9 g/dL 2.7 3.1  B-Globulin SerPl Elph-Mcnc Latest Ref Range: 0.7 - 1.3 g/dL 0.8 0.8  IgG (Immunoglobin G), Serum Latest Ref Range: 603 - 1,613 mg/dL 729 1,150  IgM (Immunoglobulin M), Srm Latest Ref Range: 20 - 172 mg/dL 30 29  IgA Latest Ref Range: 90 - 386 mg/dL 70 (L) 82 (L)    Impression and Plan:  55 year old man with:  1.  IgG kappa multiple myeloma diagnosed in 2019.     He is currently on active surveillance and has not received any treatment since December 2021.  Protein studies obtained on June 20, 2021 showed no detectable M spike and normalization of his IgG level.  He does have elevated kappa and lambda light chains with slight increase in his ratio.  Risks and benefits of continued active surveillance versus active therapy were discussed.  At this time I recommended continued surveillance and reinstitute therapy if he has symptomatic progression.  2. Anemia: Hemoglobin is adequate at this time and does not require any intervention.  Anemia is related to chronic kidney disease and plasma cell disorder.   3.  Renal failure: He continues to be hemodialysis dependent currently.  4. Goals of care: I reiterated that multiple myeloma remains an incurable condition although disease that can be maintained and managed for an extended period of time.   6. Follow-up: In 3 months for repeat follow-up.   30 minutes were dedicated to this encounter.  The time was spent on reviewing laboratory data, disease status update, treatment choices and future plan of care discussion  Zola Button, MD 10/20/202211:46 AM

## 2021-06-28 ENCOUNTER — Encounter: Payer: Self-pay | Admitting: Neurology

## 2021-06-28 DIAGNOSIS — D631 Anemia in chronic kidney disease: Secondary | ICD-10-CM | POA: Diagnosis not present

## 2021-06-28 DIAGNOSIS — N186 End stage renal disease: Secondary | ICD-10-CM | POA: Diagnosis not present

## 2021-06-28 DIAGNOSIS — Z23 Encounter for immunization: Secondary | ICD-10-CM | POA: Diagnosis not present

## 2021-06-28 DIAGNOSIS — Z992 Dependence on renal dialysis: Secondary | ICD-10-CM | POA: Diagnosis not present

## 2021-06-28 DIAGNOSIS — N2581 Secondary hyperparathyroidism of renal origin: Secondary | ICD-10-CM | POA: Diagnosis not present

## 2021-07-01 DIAGNOSIS — Z992 Dependence on renal dialysis: Secondary | ICD-10-CM | POA: Diagnosis not present

## 2021-07-01 DIAGNOSIS — D631 Anemia in chronic kidney disease: Secondary | ICD-10-CM | POA: Diagnosis not present

## 2021-07-01 DIAGNOSIS — Z23 Encounter for immunization: Secondary | ICD-10-CM | POA: Diagnosis not present

## 2021-07-01 DIAGNOSIS — N2581 Secondary hyperparathyroidism of renal origin: Secondary | ICD-10-CM | POA: Diagnosis not present

## 2021-07-01 DIAGNOSIS — N186 End stage renal disease: Secondary | ICD-10-CM | POA: Diagnosis not present

## 2021-07-03 DIAGNOSIS — N2581 Secondary hyperparathyroidism of renal origin: Secondary | ICD-10-CM | POA: Diagnosis not present

## 2021-07-03 DIAGNOSIS — D631 Anemia in chronic kidney disease: Secondary | ICD-10-CM | POA: Diagnosis not present

## 2021-07-03 DIAGNOSIS — N186 End stage renal disease: Secondary | ICD-10-CM | POA: Diagnosis not present

## 2021-07-03 DIAGNOSIS — Z23 Encounter for immunization: Secondary | ICD-10-CM | POA: Diagnosis not present

## 2021-07-03 DIAGNOSIS — Z992 Dependence on renal dialysis: Secondary | ICD-10-CM | POA: Diagnosis not present

## 2021-07-04 NOTE — Progress Notes (Shared)
[facility] Note  07/09/2021     CHIEF COMPLAINT Patient presents for No chief complaint on file.  HISTORY OF PRESENT ILLNESS: Isaiah [name] is a 55 y.o. male who presents to the clinic today for:    Referring physician: [name], MD [address] [place],  [place] [address]  HISTORICAL INFORMATION:   Selected notes from the [place] Pt of Dr. [name].  Urgent re-referral from Dr. [name] for possible PDR w/NVI OD.  Presented to [facility] on 7.27.21 w/elevated IOPS and OD swelling and irritation.  Started on Brimonidine BID OD and Pred Q1hr OD.   CURRENT MEDICATIONS: No current outpatient medications on file. (Ophthalmic Drugs)   No current facility-administered medications for this visit. (Ophthalmic Drugs)   Current Outpatient Medications (Other)  Medication Sig   acetaminophen (TYLENOL) 325 MG tablet Take 1-2 tablets (325-650 mg total) by mouth every 4 (four) hours as needed for mild pain.   acetaminophen (TYLENOL) 325 MG tablet Take by mouth.   acyclovir (ZOVIRAX) 400 MG tablet Take by mouth.   albuterol (PROAIR HFA) 108 (90 Base) MCG/ACT inhaler Inhale two puffs every 4-6 hours if needed for cough or wheeze (Patient taking differently: Inhale 2 puffs into the lungs every 4 (four) hours as needed for wheezing or shortness of breath. Inhale two puffs every 4-6 hours if needed for cough or wheeze)   Alcohol Swabs (SM ALCOHOL PREP) 70 % PADS    atorvastatin (LIPITOR) 20 MG tablet TAKE 1 TABLET BY MOUTH ONCE A DAY   AURYXIA 1 GM 210 MG(Fe) tablet Take 210 mg by mouth every evening.   calcitRIOL (ROCALTROL) 0.25 MCG capsule Take 0.25 mcg by mouth daily.   Cetirizine HCl 10 MG CAPS Take 1 capsule by mouth daily.    Cholecalciferol (D3-1000 PO) Take 1 tablet by mouth See admin instructions. Take one tablet by mouth on Monday,Wednesday and Fridays   Cinacalcet HCl (SENSIPAR PO) Take by mouth.   clonazePAM (KLONOPIN)  0.5 MG tablet Take 2 tablets in morning on Monday, Wednesday, Friday before dialysis   escitalopram (LEXAPRO) 10 MG tablet Take 1 tablet (10 mg total) by mouth daily.   ferric citrate (AURYXIA) 1 GM 210 MG(Fe) tablet Take 1 tablet by mouth 3 times a day with meals as directed   fluticasone (FLONASE) 50 MCG/ACT nasal spray Place into the nose.   fluticasone furoate-vilanterol (BREO ELLIPTA) 200-25 MCG/INH AEPB INHALE 1 PUFF BY MOUTH INTO THE LUNGS AT THE SAME TIME EVERY DAY   FREESTYLE LITE test strip    Glucagon, rDNA, (GLUCAGON EMERGENCY) 1 MG KIT See admin instructions.   hydrOXYzine (ATARAX/VISTARIL) 25 MG tablet Take by mouth as needed.   Insulin Glargine-yfgn (SEMGLEE, YFGN,) 100 UNIT/ML SOPN Inject 6 units subcutaneously once a day   Insulin Lispro (HUMALOG KWIKPEN [place]) Inject 2-4 Units into the skin 3 (three) times daily before meals. Sliding scale: 150-250 =2 units, 250 -350=3 units, 350 += 4 units   lacosamide (VIMPAT) 200 MG TABS tablet TAKE 1 TABLET TWICE A DAY EXCEPT ON DIALYSIS DAYS M-W-F GIVE 1 TAB IN AM, 1 TAB AFTER DIALYSIS, 1 TAB IN PM.   lamoTRIgine (LAMICTAL) 200 MG tablet TAKE 1 TABLET BY MOUTH 2 TIMES DAILY   Lancets (FREESTYLE) lancets    Methoxy PEG-Epoetin Beta (MIRCERA IJ) Mircera (Patient not taking: Reported on 06/13/2021)   metoprolol succinate (TOPROL-XL) 25 MG 24 hr tablet Take by mouth. (Patient not taking: Reported on  06/13/2021)   midodrine (PROAMATINE) 10 MG tablet Take 1 tablet (10 mg total) by mouth 2 (two) times daily.   multivitamin (RENA-VIT) TABS tablet Take 1 tablet by mouth at bedtime.   pantoprazole (PROTONIX) 40 MG tablet TAKE 1 TABLET BY MOUTH ONCE DAILY   prochlorperazine (COMPAZINE) 10 MG tablet Take by mouth.   UNIFINE PENTIPS 31G X 8 MM MISC USE AS DIRECTED DAILY AFTER SUPPER   No current facility-administered medications for this visit. (Other)   REVIEW OF SYSTEMS:   ALLERGIES Allergies  Allergen Reactions   Codeine Swelling   Codeine Rash  and Other (See Comments)    Unknown reaction (patient says it was more serious than just a rash, but he can't remember what happened)    PAST MEDICAL HISTORY Past Medical History:  Diagnosis Date   A-fib ([place])    Anemia    Diabetic retinopathy ([place])    PDR OU   DM type 2 (diabetes mellitus, type 2) ([place]) 06/09/2019   ESRD (end stage renal disease) on dialysis [facility])    Essential hypertension 06/09/2019   GIB (gastrointestinal bleeding)    Recurrent episodes- 09/2014, 09/2015 and 05/2016   Gout    History of recent blood transfusion 10/27/14   2 Units PRBC's   Hyperkalemia 07/2011   Hypertensive retinopathy    OU   Multiple myeloma ([place])    Pulmonary embolism ([place]) 07/2011; 09/27/2014   a. Bilat PE 07/2011 - unclear cause, tx with 6 months Coumadin.;    Seizure disorder ([place]) 06/09/2019   Sepsis ([place])    Sickle cell-thalassemia disease ([place])    a. Sickle cell trait.   Stroke ([place]) 09/2015   R-MCA, L-MCA, PCA and bilateral cerebellar complicated by DVT/PE   Subdural hematoma 05/2019   Past Surgical History:  Procedure Laterality Date   AV FISTULA PLACEMENT Right 12/05/2015   Procedure: INSERTION OF ARTERIOVENOUS (AV) GORE-TEX GRAFT ARM;  Surgeon: Elam Dutch, MD;  Location: [place];  Service: Vascular;  Laterality: Right;   BONE MARROW BIOPSY     CATARACT EXTRACTION Right 09/2019   Dr. Herbert Deaner   CATARACT EXTRACTION Left 10/2019   Dr. Herbert Deaner   CHOLECYSTECTOMY     CHOLECYSTECTOMY  [id number]?   COLONOSCOPY WITH PROPOFOL N/A 01/22/2017   Procedure: COLONOSCOPY WITH PROPOFOL;  Surgeon: Wilford Corner, MD;  Location: [facility];  Service: Endoscopy;  Laterality: N/A;   EYE SURGERY Bilateral    Cat Sx - Dr. Herbert Deaner   INSERTION OF DIALYSIS CATHETER Right 10/16/2015   Procedure: INSERTION OF PALINDROME DIALYSIS CATHETER ;  Surgeon: Elam Dutch, MD;  Location: [place];  Service: Vascular;  Laterality: Right;   OTHER SURGICAL HISTORY     Retinal surgery   PARS PLANA VITRECTOMY   02/17/2012   Procedure: PARS PLANA VITRECTOMY WITH 25 GAUGE;  Surgeon: Hayden Pedro, MD;  Location: [place];  Service: Ophthalmology;  Laterality: Right;   PERIPHERAL VASCULAR CATHETERIZATION N/A 03/20/2016   Procedure: A/V Shuntogram/Fistulagram;  Surgeon: Conrad Glenfield, MD;  Location: [place] CV LAB;  Service: Cardiovascular;  Laterality: N/A;   TEE WITHOUT CARDIOVERSION N/A 12/02/2016   Procedure: TRANSESOPHAGEAL ECHOCARDIOGRAM (TEE);  Surgeon: Larey Dresser, MD;  Location: [name] ENDOSCOPY;  Service: Cardiovascular;  Laterality: N/A;   FAMILY HISTORY Family History  Problem Relation Age of Onset   Hypertension Mother    Hypertension Father    Stroke Father    Sickle cell anemia Brother    Diabetes Mother    Sickle  cell anemia Brother    Diabetes type I Brother    Kidney disease Brother    SOCIAL HISTORY Social History   Tobacco Use   Smoking status: Never   Smokeless tobacco: Never  Vaping Use   Vaping Use: Never used  Substance Use Topics   Alcohol use: Not Currently   Drug use: Never       OPHTHALMIC EXAM:  Not recorded     IMAGING AND PROCEDURES  Imaging and Procedures for 07/09/2021          ASSESSMENT/PLAN:  No diagnosis found.  1-3. Proliferative diabetic retinopathy w/ severe retinal ischemia, no DME, OU (OD > OS)  - former pt of Dr. [name], last visit 03.04.21  - history of PPV w/ endolaser, FAX OD 6.11.13; PRP OS 2020  - s/p IVA OD #1 (07.29.21) as above for new VH and microhyphema, #2 (07.19.22), #3 (08.23.22), #4 (10.4.22) -- for NVI  - s/p PRP OD (08.24.21), fill in (08.02.22)  - BCVA OD stable at CF; OS improved to 20/80 from 20/100 - OCT without diabetic macular edema, but diffuse atrophy, both eyes -- OD with RAO-like apprearance -- severe ischemia  - FA (08.12.21) shows severe vascular non-perfusion OD -- peripheral nonperfusion extends posteriorly just outside arcades   - exam and gonio showed new NVI and NVA OD on 7.19.22--regressed  post IVA OD on 7.19.22  - fully regressed NVI today  - IOP OD 11 today --  continue brimonidine BID OD - continue PF TID OD per Dr. Herbert Deaner - recommend IVA OD #5 today, 11.1.22 for NVI - pt wishes to proceed - RBA of procedure discussed, questions answered - informed consent obtained and signed - see procedure note - F/u in 3-4 weeks, DFE, OCT  4,5. VH & microyphema OD secondary to PDR -- stably resolved  - s/p IVA OD #1 (07.29.21)  - VH and microhyphema stably resolved OD   6. History of Sickle Cell Retinopathy  - 360 PRP in place OU  - no active NV, confirmed on FA 8.12.21  7,8. Hypertensive retinopathy OU - discussed importance of tight BP control - monitor  9. Pseudophakia OU  - s/p CE/IOL OU (OD Jan 2021; OS Feb 2021  - IOL in good position, doing well  - monitor  10. Dry eyes OU - recommend artificial tears and lubricating ointment as needed  Ophthalmic Meds Ordered this visit:  No orders of the defined types were placed in this encounter.     No follow-ups on file.  There are no Patient Instructions on file for this visit.  Explained the diagnoses, plan, and follow up with the patient and they expressed understanding.  Patient expressed understanding of the importance of proper follow up care.  This document serves as a record of services personally performed by Gardiner Sleeper, MD, PhD. It was created on their behalf by Estill Bakes, COT an ophthalmic technician. The creation of this record is the provider's dictation and/or activities during the visit.    Electronically signed by: Estill Bakes, COT 10.27.22 @ 9:55 AM   Gardiner Sleeper, M.D., Ph.D. Diseases & Surgery of the Retina and Vitreous [facility]: M myopia (nearsighted); A astigmatism; H hyperopia (farsighted); P presbyopia; Mrx spectacle prescription;  CTL contact lenses; OD right eye; OS left eye; OU both eyes  XT exotropia; ET esotropia; PEK punctate  epithelial keratitis; PEE punctate epithelial erosions; DES dry eye syndrome; MGD meibomian gland dysfunction;  ATs artificial tears; PFAT's preservative free artificial tears; [place] nuclear sclerotic cataract; PSC posterior subcapsular cataract; ERM epi-retinal membrane; PVD posterior vitreous detachment; RD retinal detachment; DM diabetes mellitus; DR diabetic retinopathy; NPDR non-proliferative diabetic retinopathy; PDR proliferative diabetic retinopathy; CSME clinically significant macular edema; DME diabetic macular edema; dbh dot blot hemorrhages; CWS cotton wool spot; POAG primary open angle glaucoma; C/D cup-to-disc ratio; HVF humphrey visual field; GVF goldmann visual field; OCT optical coherence tomography; IOP intraocular pressure; BRVO Branch retinal vein occlusion; CRVO central retinal vein occlusion; CRAO central retinal artery occlusion; BRAO branch retinal artery occlusion; RT retinal tear; SB scleral buckle; PPV pars plana vitrectomy; VH Vitreous hemorrhage; PRP panretinal laser photocoagulation; IVK intravitreal kenalog; VMT vitreomacular traction; MH Macular hole;  NVD neovascularization of the disc; NVE neovascularization elsewhere; AREDS age related eye disease study; ARMD age related macular degeneration; POAG primary open angle glaucoma; EBMD epithelial/anterior basement membrane dystrophy; ACIOL anterior chamber intraocular lens; IOL intraocular lens; PCIOL posterior chamber intraocular lens; Phaco/IOL phacoemulsification with intraocular lens placement; [place] photorefractive keratectomy; LASIK laser assisted in situ keratomileusis; HTN hypertension; DM diabetes mellitus; COPD chronic obstructive pulmonary disease

## 2021-07-05 DIAGNOSIS — Z23 Encounter for immunization: Secondary | ICD-10-CM | POA: Diagnosis not present

## 2021-07-05 DIAGNOSIS — N186 End stage renal disease: Secondary | ICD-10-CM | POA: Diagnosis not present

## 2021-07-05 DIAGNOSIS — D631 Anemia in chronic kidney disease: Secondary | ICD-10-CM | POA: Diagnosis not present

## 2021-07-05 DIAGNOSIS — N2581 Secondary hyperparathyroidism of renal origin: Secondary | ICD-10-CM | POA: Diagnosis not present

## 2021-07-05 DIAGNOSIS — Z992 Dependence on renal dialysis: Secondary | ICD-10-CM | POA: Diagnosis not present

## 2021-07-08 ENCOUNTER — Other Ambulatory Visit (HOSPITAL_COMMUNITY): Payer: Self-pay

## 2021-07-08 ENCOUNTER — Telehealth: Payer: Self-pay

## 2021-07-08 ENCOUNTER — Other Ambulatory Visit: Payer: Self-pay | Admitting: Internal Medicine

## 2021-07-08 ENCOUNTER — Ambulatory Visit: Payer: Medicare Other | Admitting: Neurology

## 2021-07-08 DIAGNOSIS — Z23 Encounter for immunization: Secondary | ICD-10-CM | POA: Diagnosis not present

## 2021-07-08 DIAGNOSIS — Z992 Dependence on renal dialysis: Secondary | ICD-10-CM | POA: Diagnosis not present

## 2021-07-08 DIAGNOSIS — N2581 Secondary hyperparathyroidism of renal origin: Secondary | ICD-10-CM | POA: Diagnosis not present

## 2021-07-08 DIAGNOSIS — D631 Anemia in chronic kidney disease: Secondary | ICD-10-CM | POA: Diagnosis not present

## 2021-07-08 DIAGNOSIS — N186 End stage renal disease: Secondary | ICD-10-CM | POA: Diagnosis not present

## 2021-07-08 MED ORDER — PANTOPRAZOLE SODIUM 40 MG PO TBEC
DELAYED_RELEASE_TABLET | Freq: Every day | ORAL | 1 refills | Status: DC
  Filled 2021-07-08: qty 90, 90d supply, fill #0
  Filled 2021-11-02: qty 90, 90d supply, fill #1

## 2021-07-08 NOTE — Telephone Encounter (Signed)
Mrs. Zingg was notified the [facility] forms have been faxed.  The form was emailed to Mrs [facility] to the email on file.

## 2021-07-09 ENCOUNTER — Encounter (INDEPENDENT_AMBULATORY_CARE_PROVIDER_SITE_OTHER): Payer: Medicare Other | Admitting: Ophthalmology

## 2021-07-09 DIAGNOSIS — N186 End stage renal disease: Secondary | ICD-10-CM | POA: Diagnosis not present

## 2021-07-09 DIAGNOSIS — D571 Sickle-cell disease without crisis: Secondary | ICD-10-CM

## 2021-07-09 DIAGNOSIS — H2101 Hyphema, right eye: Secondary | ICD-10-CM

## 2021-07-09 DIAGNOSIS — I10 Essential (primary) hypertension: Secondary | ICD-10-CM

## 2021-07-09 DIAGNOSIS — H35033 Hypertensive retinopathy, bilateral: Secondary | ICD-10-CM

## 2021-07-09 DIAGNOSIS — H3581 Retinal edema: Secondary | ICD-10-CM

## 2021-07-09 DIAGNOSIS — H3582 Retinal ischemia: Secondary | ICD-10-CM

## 2021-07-09 DIAGNOSIS — H04123 Dry eye syndrome of bilateral lacrimal glands: Secondary | ICD-10-CM

## 2021-07-09 DIAGNOSIS — Z992 Dependence on renal dialysis: Secondary | ICD-10-CM | POA: Diagnosis not present

## 2021-07-09 DIAGNOSIS — E113593 Type 2 diabetes mellitus with proliferative diabetic retinopathy without macular edema, bilateral: Secondary | ICD-10-CM

## 2021-07-09 DIAGNOSIS — Z961 Presence of intraocular lens: Secondary | ICD-10-CM

## 2021-07-09 DIAGNOSIS — H4311 Vitreous hemorrhage, right eye: Secondary | ICD-10-CM

## 2021-07-09 DIAGNOSIS — E1122 Type 2 diabetes mellitus with diabetic chronic kidney disease: Secondary | ICD-10-CM | POA: Diagnosis not present

## 2021-07-10 DIAGNOSIS — N2581 Secondary hyperparathyroidism of renal origin: Secondary | ICD-10-CM | POA: Diagnosis not present

## 2021-07-10 DIAGNOSIS — N186 End stage renal disease: Secondary | ICD-10-CM | POA: Diagnosis not present

## 2021-07-10 DIAGNOSIS — Z992 Dependence on renal dialysis: Secondary | ICD-10-CM | POA: Diagnosis not present

## 2021-07-10 DIAGNOSIS — Z23 Encounter for immunization: Secondary | ICD-10-CM | POA: Diagnosis not present

## 2021-07-11 ENCOUNTER — Ambulatory Visit: Payer: Medicare Other | Admitting: Neurology

## 2021-07-12 ENCOUNTER — Other Ambulatory Visit (HOSPITAL_COMMUNITY): Payer: Self-pay

## 2021-07-12 DIAGNOSIS — N186 End stage renal disease: Secondary | ICD-10-CM | POA: Diagnosis not present

## 2021-07-12 DIAGNOSIS — Z992 Dependence on renal dialysis: Secondary | ICD-10-CM | POA: Diagnosis not present

## 2021-07-12 DIAGNOSIS — N2581 Secondary hyperparathyroidism of renal origin: Secondary | ICD-10-CM | POA: Diagnosis not present

## 2021-07-12 DIAGNOSIS — Z23 Encounter for immunization: Secondary | ICD-10-CM | POA: Diagnosis not present

## 2021-07-15 ENCOUNTER — Other Ambulatory Visit (HOSPITAL_COMMUNITY): Payer: Self-pay

## 2021-07-15 DIAGNOSIS — Z992 Dependence on renal dialysis: Secondary | ICD-10-CM | POA: Diagnosis not present

## 2021-07-15 DIAGNOSIS — Z23 Encounter for immunization: Secondary | ICD-10-CM | POA: Diagnosis not present

## 2021-07-15 DIAGNOSIS — N2581 Secondary hyperparathyroidism of renal origin: Secondary | ICD-10-CM | POA: Diagnosis not present

## 2021-07-15 DIAGNOSIS — N186 End stage renal disease: Secondary | ICD-10-CM | POA: Diagnosis not present

## 2021-07-16 ENCOUNTER — Ambulatory Visit (INDEPENDENT_AMBULATORY_CARE_PROVIDER_SITE_OTHER): Payer: Medicare Other | Admitting: Ophthalmology

## 2021-07-16 ENCOUNTER — Other Ambulatory Visit: Payer: Self-pay

## 2021-07-16 ENCOUNTER — Encounter (INDEPENDENT_AMBULATORY_CARE_PROVIDER_SITE_OTHER): Payer: Self-pay | Admitting: Ophthalmology

## 2021-07-16 DIAGNOSIS — H3581 Retinal edema: Secondary | ICD-10-CM

## 2021-07-16 DIAGNOSIS — Z961 Presence of intraocular lens: Secondary | ICD-10-CM | POA: Diagnosis not present

## 2021-07-16 DIAGNOSIS — I10 Essential (primary) hypertension: Secondary | ICD-10-CM

## 2021-07-16 DIAGNOSIS — H36 Retinal disorders in diseases classified elsewhere: Secondary | ICD-10-CM

## 2021-07-16 DIAGNOSIS — D571 Sickle-cell disease without crisis: Secondary | ICD-10-CM

## 2021-07-16 DIAGNOSIS — H04123 Dry eye syndrome of bilateral lacrimal glands: Secondary | ICD-10-CM

## 2021-07-16 DIAGNOSIS — H3582 Retinal ischemia: Secondary | ICD-10-CM | POA: Diagnosis not present

## 2021-07-16 DIAGNOSIS — E113593 Type 2 diabetes mellitus with proliferative diabetic retinopathy without macular edema, bilateral: Secondary | ICD-10-CM | POA: Diagnosis not present

## 2021-07-16 DIAGNOSIS — H35033 Hypertensive retinopathy, bilateral: Secondary | ICD-10-CM | POA: Diagnosis not present

## 2021-07-16 MED ORDER — BEVACIZUMAB CHEMO INJECTION 1.25MG/0.05ML SYRINGE FOR KALEIDOSCOPE
1.2500 mg | INTRAVITREAL | Status: AC | PRN
Start: 2021-07-16 — End: 2021-07-16
  Administered 2021-07-16: 1.25 mg via INTRAVITREAL

## 2021-07-16 NOTE — Progress Notes (Signed)
[facility] Note  07/16/2021     CHIEF COMPLAINT Patient presents for Retina Follow Up  HISTORY OF PRESENT ILLNESS: Isaiah [name] is a 55 y.o. male who presents to the clinic today for:   HPI     Retina Follow Up   Patient presents with  Diabetic Retinopathy.  In both eyes.  This started years ago.  Severity is moderate.  Duration of 5 weeks.  Since onset it is stable.  I, the attending physician,  performed the HPI with the patient and updated documentation appropriately.        Comments   55 y/o male pt here for 5 wk f/u for PDR w/severe retinal ischemia OU.  No change in [place] OU.  Denies pain, FOL, floaters, but sometimes eyes feel irritated in the mornings.  AT prn OU.  BS 161 in office today.  Last HgA1C was 5.7.  Brimonidine BID OD, PF TID OD.      Last edited by [name], MD on 07/16/2021 12:36 PM.    Pt states eyes are irritated, wife is giving him AT's which help  Referring physician: [name], MD [address] [place],  [place] [address]  HISTORICAL INFORMATION:   Selected notes from the [place] [facility] of Dr. [name].  Urgent re-referral from Dr. [name] for possible PDR w/NVI OD.  Presented to [facility] on 7.27.21 w/elevated IOPS and OD swelling and irritation.  Started on Brimonidine BID OD and Pred Q1hr OD.   CURRENT MEDICATIONS: Current Outpatient Medications (Ophthalmic Drugs)  Medication Sig   brimonidine (ALPHAGAN) 0.2 % ophthalmic solution    prednisoLONE acetate (PRED FORTE) 1 % ophthalmic suspension Apply to eye.   No current facility-administered medications for this visit. (Ophthalmic Drugs)   Current Outpatient Medications (Other)  Medication Sig   acetaminophen (TYLENOL) 325 MG tablet Take 1-2 tablets (325-650 mg total) by mouth every 4 (four) hours as needed for mild pain.   acyclovir (ZOVIRAX) 400 MG tablet Take by mouth.   albuterol (PROAIR HFA) 108 (90 Base) MCG/ACT inhaler  Inhale two puffs every 4-6 hours if needed for cough or wheeze (Patient taking differently: Inhale 2 puffs into the lungs every 4 (four) hours as needed for wheezing or shortness of breath. Inhale two puffs every 4-6 hours if needed for cough or wheeze)   Alcohol Swabs (SM ALCOHOL PREP) 70 % PADS    atorvastatin (LIPITOR) 20 MG tablet TAKE 1 TABLET BY MOUTH ONCE A DAY   AURYXIA 1 GM 210 MG(Fe) tablet Take 210 mg by mouth every evening.   calcitRIOL (ROCALTROL) 0.25 MCG capsule Take 0.25 mcg by mouth daily.   Cetirizine HCl 10 MG CAPS Take 1 capsule by mouth daily.    Cholecalciferol (D3-1000 PO) Take 1 tablet by mouth See admin instructions. Take one tablet by mouth on Monday,Wednesday and Fridays   Cinacalcet HCl (SENSIPAR PO) Take by mouth.   clonazePAM (KLONOPIN) 0.5 MG tablet Take 2 tablets in morning on Monday, Wednesday, Friday before dialysis   escitalopram (LEXAPRO) 10 MG tablet Take 1 tablet (10 mg total) by mouth daily.   ferric citrate (AURYXIA) 1 GM 210 MG(Fe) tablet Take 1 tablet by mouth 3 times a day with meals as directed   fluticasone (FLONASE) 50 MCG/ACT nasal spray Place into the nose.   fluticasone furoate-vilanterol (BREO ELLIPTA) 200-25 MCG/INH AEPB INHALE 1 PUFF BY MOUTH INTO THE LUNGS AT THE SAME TIME EVERY DAY  FREESTYLE LITE test strip    Glucagon, rDNA, (GLUCAGON EMERGENCY) 1 MG KIT See admin instructions.   hydrOXYzine (ATARAX/VISTARIL) 25 MG tablet Take by mouth as needed.   Insulin Glargine-yfgn (SEMGLEE, YFGN,) 100 UNIT/ML SOPN Inject 6 units subcutaneously once a day   Insulin Lispro (HUMALOG KWIKPEN [place]) Inject 2-4 Units into the skin 3 (three) times daily before meals. Sliding scale: 150-250 =2 units, 250 -350=3 units, 350 += 4 units   lacosamide (VIMPAT) 200 MG TABS tablet TAKE 1 TABLET TWICE A DAY EXCEPT ON DIALYSIS DAYS M-W-F GIVE 1 TAB IN AM, 1 TAB AFTER DIALYSIS, 1 TAB IN PM.   lamoTRIgine (LAMICTAL) 200 MG tablet TAKE 1 TABLET BY MOUTH 2 TIMES DAILY    Lancets (FREESTYLE) lancets    Methoxy PEG-Epoetin Beta (MIRCERA IJ)    metoprolol succinate (TOPROL-XL) 25 MG 24 hr tablet Take by mouth.   midodrine (PROAMATINE) 10 MG tablet Take 1 tablet (10 mg total) by mouth 2 (two) times daily.   multivitamin (RENA-VIT) TABS tablet Take 1 tablet by mouth at bedtime.   pantoprazole (PROTONIX) 40 MG tablet TAKE 1 TABLET BY MOUTH ONCE DAILY   prochlorperazine (COMPAZINE) 10 MG tablet Take by mouth.   QUEtiapine (SEROQUEL) 25 MG tablet    UNIFINE PENTIPS 31G X 8 MM MISC USE AS DIRECTED DAILY AFTER SUPPER   acetaminophen (TYLENOL) 325 MG tablet Take by mouth. (Patient not taking: Reported on 07/16/2021)   No current facility-administered medications for this visit. (Other)   REVIEW OF SYSTEMS: ROS   Positive for: Gastrointestinal, Neurological, Genitourinary, Endocrine, Cardiovascular, Eyes, Respiratory Negative for: Constitutional, Skin, Musculoskeletal, HENT, Psychiatric, Allergic/Imm, Heme/Lymph Last edited by Matthew Folks, COA on 07/16/2021  9:09 AM.      ALLERGIES Allergies  Allergen Reactions   Codeine Swelling   Codeine Rash and Other (See Comments)    Unknown reaction (patient says it was more serious than just a rash, but he can't remember what happened)    PAST MEDICAL HISTORY Past Medical History:  Diagnosis Date   A-fib ([place])    Anemia    Diabetic retinopathy ([place])    PDR OU   DM type 2 (diabetes mellitus, type 2) ([place]) 06/09/2019   ESRD (end stage renal disease) on dialysis [facility])    Essential hypertension 06/09/2019   GIB (gastrointestinal bleeding)    Recurrent episodes- 09/2014, 09/2015 and 05/2016   Gout    History of recent blood transfusion 10/27/14   2 Units PRBC's   Hyperkalemia 07/2011   Hypertensive retinopathy    OU   Multiple myeloma ([place])    Pulmonary embolism ([place]) 07/2011; 09/27/2014   a. Bilat PE 07/2011 - unclear cause, tx with 6 months Coumadin.;    Seizure disorder ([place]) 06/09/2019   Sepsis ([place])     Sickle cell-thalassemia disease ([place])    a. Sickle cell trait.   Stroke ([place]) 09/2015   R-MCA, L-MCA, PCA and bilateral cerebellar complicated by DVT/PE   Subdural hematoma 05/2019   Past Surgical History:  Procedure Laterality Date   AV FISTULA PLACEMENT Right 12/05/2015   Procedure: INSERTION OF ARTERIOVENOUS (AV) GORE-TEX GRAFT ARM;  Surgeon: Elam Dutch, MD;  Location: [facility];  Service: Vascular;  Laterality: Right;   BONE MARROW BIOPSY     CATARACT EXTRACTION Right 09/2019   Dr. Herbert Deaner   CATARACT EXTRACTION Left 10/2019   Dr. Herbert Deaner   CHOLECYSTECTOMY     CHOLECYSTECTOMY  [phone]?   COLONOSCOPY WITH PROPOFOL N/A 01/22/2017  Procedure: COLONOSCOPY WITH PROPOFOL;  Surgeon: Wilford Corner, MD;  Location: [facility];  Service: Endoscopy;  Laterality: N/A;   EYE SURGERY Bilateral    Cat Sx - Dr. Herbert Deaner   INSERTION OF DIALYSIS CATHETER Right 10/16/2015   Procedure: INSERTION OF PALINDROME DIALYSIS CATHETER ;  Surgeon: Elam Dutch, MD;  Location: [facility];  Service: Vascular;  Laterality: Right;   OTHER SURGICAL HISTORY     Retinal surgery   PARS PLANA VITRECTOMY  02/17/2012   Procedure: PARS PLANA VITRECTOMY WITH 25 GAUGE;  Surgeon: Hayden Pedro, MD;  Location: [facility];  Service: Ophthalmology;  Laterality: Right;   PERIPHERAL VASCULAR CATHETERIZATION N/A 03/20/2016   Procedure: A/V Shuntogram/Fistulagram;  Surgeon: Conrad Sunol, MD;  Location: [facility];  Service: Cardiovascular;  Laterality: N/A;   TEE WITHOUT CARDIOVERSION N/A 12/02/2016   Procedure: TRANSESOPHAGEAL ECHOCARDIOGRAM (TEE);  Surgeon: Larey Dresser, MD;  Location: [facility] ENDOSCOPY;  Service: Cardiovascular;  Laterality: N/A;   FAMILY HISTORY Family History  Problem Relation Age of Onset   Hypertension Mother    Hypertension Father    Stroke Father    Sickle cell anemia Brother    Diabetes Mother    Sickle cell anemia Brother    Diabetes type I Brother    Kidney disease Brother    SOCIAL  HISTORY Social History   Tobacco Use   Smoking status: Never   Smokeless tobacco: Never  Vaping Use   Vaping Use: Never used  Substance Use Topics   Alcohol use: Not Currently   Drug use: Never       OPHTHALMIC EXAM:  Base Eye Exam     Visual Acuity (Snellen - Linear)       Right Left   Dist cc CF @ 3' 20/100 -2   Dist ph cc NI NI    Correction: Glasses         Tonometry (Tonopen, 9:14 AM)       Right Left   Pressure 11 12         Pupils       Dark Light Shape React APD   Right 3 2 Round Minimal None   Left 3 2 Round Minimal None         Visual Fields (Counting fingers)       Left Right   Restrictions Partial outer inferior temporal deficiency Partial outer superior temporal, inferior nasal deficiencies         Extraocular Movement       Right Left    Full Full         Neuro/Psych     Oriented x3: Yes   Mood/Affect: Normal         Dilation     Both eyes: 1.0% Mydriacyl, 2.5% Phenylephrine @ 9:14 AM           Slit Lamp and Fundus Exam     Slit Lamp Exam       Right Left   Lids/Lashes Dermato Dermato   Conjunctiva/Sclera Melanosis, very attenuated vessels, trace injection Melanosis   Cornea Arcus, well healed cataract wound, 1+ PEE 2-3+ inferior PEE, well healed temporal cataract wounds   Anterior Chamber Deep; 0.5+ fine cell/pigment Deep and quiet   Iris Round and poorly dilated to 4.16m,  NVI fully regressed  Round and dilated, No NVI   Lens PC IOL in good position, elschnig pearls ST PCIOL   Vitreous Post vitrectomy.  Residual anterior vitreous syneresis.  +RBC -  improved, +pigment Mild asteroid hyalosis, syneresis         Fundus Exam       Right Left   Disc 3-4+pallor. Sharp rim Hazy view, mild pallor, sharp rim   C/D Ratio 0.7 0.6   Macula Blunted foveal reflex. atrophic; ischemic, no heme Flat, Blunted foveal reflex, No heme or edema   Vessels Attenuated, tortuous, severe midzonal attenuation and ischemia,  sclerotic Attenuated, tortuous   Periphery Attached, Peripheral laser scars, good posterior fill in 360 Attached.  360 PRP            IMAGING AND PROCEDURES  Imaging and Procedures for 07/16/2021  OCT, Retina - OU - Both Eyes       Right Eye Quality was borderline. Central Foveal Thickness: 226. Progression has been stable. Findings include normal foveal contour, no IRF, no SRF, outer retinal atrophy, epiretinal membrane, inner retinal atrophy, intraretinal hyper-reflective material (diffuse retinal atrophy -- nasal thickening stably improved, mild ERM).   Left Eye Quality was borderline. Central Foveal Thickness: 240. Progression has been stable. Findings include normal foveal contour, no SRF, epiretinal membrane, no IRF (Diffuse retinal atrophy/thinning; stable retinoschisis temporal midzone caught on widefield).   Notes *Images captured and stored on drive  Diagnosis / Impression:  OD: retinal ischemia / CRAO -- diffuse retinal atrophy -- nasal thickening stably improved, mild ERM OS: Diffuse retinal atrophy/thinning; retinoschisis temporal midzone caught on widefield -- stable  Clinical management:  See below  Abbreviations: NFP - Normal foveal profile. CME - cystoid macular edema. PED - pigment epithelial detachment. IRF - intraretinal fluid. SRF - subretinal fluid. EZ - ellipsoid zone. ERM - epiretinal membrane. ORA - outer retinal atrophy. ORT - outer retinal tubulation. SRHM - subretinal hyper-reflective material. IRHM - intraretinal hyper-reflective material     Intravitreal Injection, Pharmacologic Agent - OD - Right Eye       Time Out 07/16/2021. 9:38 AM. Confirmed correct patient, procedure, site, and patient consented.   Anesthesia Topical anesthesia was used. Anesthetic medications included Lidocaine 2%, Proparacaine 0.5%.   Procedure Preparation included 5% betadine to ocular surface, eyelid speculum. A (32g) needle was used.   Injection: 1.25 mg  Bevacizumab 1.46m/0.05ml   Route: Intravitreal, Site: Right Eye   NDC: 50242-060-01, Lot:: [id number] Expiration date: 08/27/2021, Waste: 0.05 mL   Post-op Post injection exam found visual acuity of at least counting fingers, no retinal detachment. The patient tolerated the procedure well. There were no complications. The patient received written and verbal post procedure care education. Post injection medications were not given.             ASSESSMENT/PLAN:    ICD-10-CM   1. Proliferative diabetic retinopathy of both eyes without macular edema associated with type 2 diabetes mellitus (HCC)  EB63.8453Intravitreal Injection, Pharmacologic Agent - OD - Right Eye    Bevacizumab (AVASTIN) SOLN 1.25 mg    2. Retinal ischemia  H35.82     3. Retinal edema  H35.81 OCT, Retina - OU - Both Eyes    4. Sickle cell retinopathy without crisis (HWelcome  D57.1    H36     5. Essential hypertension  I10     6. Hypertensive retinopathy of both eyes  H35.033     7. Pseudophakia of both eyes  Z96.1     8. Bilateral dry eyes  H04.123       1-3. Proliferative diabetic retinopathy w/ severe retinal ischemia, no DME, OU (OD > OS)  - former pt of Dr.  [name], last visit 03.04.21  - history of PPV w/ endolaser, FAX OD 6.11.13; PRP OS 2020  - s/p IVA OD #1 (07.29.21) as above for new VH and microhyphema, #2 (07.19.22), #3 (08.23.22), #4 (10.4.22) -- for NVI  - s/p PRP OD (08.24.21), fill in (08.02.22)  - BCVA OD stable at CF; OS improved to 20/80 from 20/100 - OCT without diabetic macular edema, but diffuse atrophy, both eyes -- OD with RAO-like apprearance -- severe ischemia  - FA (08.12.21) shows severe vascular non-perfusion OD -- peripheral nonperfusion extends posteriorly just outside arcades   - exam and gonio showed new NVI and NVA OD on 7.19.22--regressed post IVA OD on 7.19.22  - fully regressed NVI today  - IOP OD 11 today --  continue brimonidine BID OD - continue PF TID OD per Dr.  Herbert Deaner - recommend IVA OD #5 today, 11.8.22 for NVI - pt wishes to proceed - RBA of procedure discussed, questions answered - informed consent obtained and signed - see procedure note - F/u in 6 weeks, DFE, OCT  4. History of Sickle Cell Retinopathy  - 360 PRP in place OU  - no active NV, confirmed on FA 8.12.21  5,6. Hypertensive retinopathy OU - discussed importance of tight BP control - monitor  7. Pseudophakia OU  - s/p CE/IOL OU (OD Jan 2021; OS Feb 2021  - IOL in good position, doing well  - monitor  8. Dry eyes OU - recommend artificial tears and lubricating ointment as needed  Ophthalmic Meds Ordered this visit:  Meds ordered this encounter  Medications   Bevacizumab (AVASTIN) SOLN 1.25 mg     Return in about 6 weeks (around 08/27/2021) for f/u PDR OU, DFE, OCT.  There are no Patient Instructions on file for this visit.  Explained the diagnoses, plan, and follow up with the patient and they expressed understanding.  Patient expressed understanding of the importance of proper follow up care.  This document serves as a record of services personally performed by Gardiner Sleeper, MD, PhD. It was created on their behalf by Estill Bakes, COT an ophthalmic technician. The creation of this record is the provider's dictation and/or activities during the visit.    Electronically signed by: Estill Bakes, COT 11.8.22 @ 12:58 PM   This document serves as a record of services personally performed by Gardiner Sleeper, MD, PhD. It was created on their behalf by San Jetty. Owens Shark, OA an ophthalmic technician. The creation of this record is the provider's dictation and/or activities during the visit.    Electronically signed by: San Jetty. Marguerita Merles 11.08.2022 12:58 PM   Gardiner Sleeper, M.D., Ph.D. Diseases & Surgery of the Retina and [place] 11.8.22  I have reviewed the above documentation for accuracy and completeness, and I agree with the  above. Gardiner Sleeper, M.D., Ph.D. 07/16/21 12:58 PM  Abbreviations: M myopia (nearsighted); A astigmatism; H hyperopia (farsighted); P presbyopia; Mrx spectacle prescription;  CTL contact lenses; OD right eye; OS left eye; OU both eyes  XT exotropia; ET esotropia; PEK punctate epithelial keratitis; PEE punctate epithelial erosions; DES dry eye syndrome; MGD meibomian gland dysfunction; ATs artificial tears; PFAT's preservative free artificial tears; [place] nuclear sclerotic cataract; PSC posterior subcapsular cataract; ERM epi-retinal membrane; PVD posterior vitreous detachment; RD retinal detachment; DM diabetes mellitus; DR diabetic retinopathy; NPDR non-proliferative diabetic retinopathy; PDR proliferative diabetic retinopathy; CSME clinically significant macular edema; DME diabetic macular edema; dbh dot blot hemorrhages;  CWS cotton wool spot; POAG primary open angle glaucoma; C/D cup-to-disc ratio; HVF humphrey visual field; GVF goldmann visual field; OCT optical coherence tomography; IOP intraocular pressure; BRVO Branch retinal vein occlusion; CRVO central retinal vein occlusion; CRAO central retinal artery occlusion; BRAO branch retinal artery occlusion; RT retinal tear; SB scleral buckle; PPV pars plana vitrectomy; VH Vitreous hemorrhage; PRP panretinal laser photocoagulation; IVK intravitreal kenalog; VMT vitreomacular traction; MH Macular hole;  NVD neovascularization of the disc; NVE neovascularization elsewhere; AREDS age related eye disease study; ARMD age related macular degeneration; POAG primary open angle glaucoma; EBMD epithelial/anterior basement membrane dystrophy; ACIOL anterior chamber intraocular lens; IOL intraocular lens; PCIOL posterior chamber intraocular lens; Phaco/IOL phacoemulsification with intraocular lens placement; [place] photorefractive keratectomy; LASIK laser assisted in situ keratomileusis; HTN hypertension; DM diabetes mellitus; COPD chronic obstructive pulmonary disease

## 2021-07-17 ENCOUNTER — Other Ambulatory Visit (HOSPITAL_COMMUNITY): Payer: Self-pay

## 2021-07-17 DIAGNOSIS — N2581 Secondary hyperparathyroidism of renal origin: Secondary | ICD-10-CM | POA: Diagnosis not present

## 2021-07-17 DIAGNOSIS — N186 End stage renal disease: Secondary | ICD-10-CM | POA: Diagnosis not present

## 2021-07-17 DIAGNOSIS — Z992 Dependence on renal dialysis: Secondary | ICD-10-CM | POA: Diagnosis not present

## 2021-07-17 DIAGNOSIS — Z23 Encounter for immunization: Secondary | ICD-10-CM | POA: Diagnosis not present

## 2021-07-18 DIAGNOSIS — Z20828 Contact with and (suspected) exposure to other viral communicable diseases: Secondary | ICD-10-CM | POA: Diagnosis not present

## 2021-07-19 ENCOUNTER — Other Ambulatory Visit (HOSPITAL_COMMUNITY): Payer: Self-pay

## 2021-07-20 DIAGNOSIS — Z992 Dependence on renal dialysis: Secondary | ICD-10-CM | POA: Diagnosis not present

## 2021-07-20 DIAGNOSIS — N2581 Secondary hyperparathyroidism of renal origin: Secondary | ICD-10-CM | POA: Diagnosis not present

## 2021-07-20 DIAGNOSIS — N186 End stage renal disease: Secondary | ICD-10-CM | POA: Diagnosis not present

## 2021-07-21 DIAGNOSIS — E1065 Type 1 diabetes mellitus with hyperglycemia: Secondary | ICD-10-CM | POA: Diagnosis not present

## 2021-07-23 DIAGNOSIS — N2581 Secondary hyperparathyroidism of renal origin: Secondary | ICD-10-CM | POA: Diagnosis not present

## 2021-07-23 DIAGNOSIS — Z992 Dependence on renal dialysis: Secondary | ICD-10-CM | POA: Diagnosis not present

## 2021-07-23 DIAGNOSIS — N186 End stage renal disease: Secondary | ICD-10-CM | POA: Diagnosis not present

## 2021-07-25 DIAGNOSIS — Z992 Dependence on renal dialysis: Secondary | ICD-10-CM | POA: Diagnosis not present

## 2021-07-25 DIAGNOSIS — N2581 Secondary hyperparathyroidism of renal origin: Secondary | ICD-10-CM | POA: Diagnosis not present

## 2021-07-25 DIAGNOSIS — N186 End stage renal disease: Secondary | ICD-10-CM | POA: Diagnosis not present

## 2021-07-29 DIAGNOSIS — N2581 Secondary hyperparathyroidism of renal origin: Secondary | ICD-10-CM | POA: Diagnosis not present

## 2021-07-29 DIAGNOSIS — Z23 Encounter for immunization: Secondary | ICD-10-CM | POA: Diagnosis not present

## 2021-07-29 DIAGNOSIS — Z992 Dependence on renal dialysis: Secondary | ICD-10-CM | POA: Diagnosis not present

## 2021-07-29 DIAGNOSIS — N186 End stage renal disease: Secondary | ICD-10-CM | POA: Diagnosis not present

## 2021-07-30 DIAGNOSIS — T782XXA Anaphylactic shock, unspecified, initial encounter: Secondary | ICD-10-CM

## 2021-07-30 HISTORY — DX: Anaphylactic shock, unspecified, initial encounter: T78.2XXA

## 2021-07-31 ENCOUNTER — Other Ambulatory Visit (HOSPITAL_COMMUNITY): Payer: Self-pay

## 2021-07-31 DIAGNOSIS — Z992 Dependence on renal dialysis: Secondary | ICD-10-CM | POA: Diagnosis not present

## 2021-07-31 DIAGNOSIS — N2581 Secondary hyperparathyroidism of renal origin: Secondary | ICD-10-CM | POA: Diagnosis not present

## 2021-07-31 DIAGNOSIS — Z23 Encounter for immunization: Secondary | ICD-10-CM | POA: Diagnosis not present

## 2021-07-31 DIAGNOSIS — N186 End stage renal disease: Secondary | ICD-10-CM | POA: Diagnosis not present

## 2021-08-03 DIAGNOSIS — N186 End stage renal disease: Secondary | ICD-10-CM | POA: Diagnosis not present

## 2021-08-03 DIAGNOSIS — Z23 Encounter for immunization: Secondary | ICD-10-CM | POA: Diagnosis not present

## 2021-08-03 DIAGNOSIS — N2581 Secondary hyperparathyroidism of renal origin: Secondary | ICD-10-CM | POA: Diagnosis not present

## 2021-08-03 DIAGNOSIS — Z992 Dependence on renal dialysis: Secondary | ICD-10-CM | POA: Diagnosis not present

## 2021-08-05 DIAGNOSIS — N2581 Secondary hyperparathyroidism of renal origin: Secondary | ICD-10-CM | POA: Diagnosis not present

## 2021-08-05 DIAGNOSIS — N186 End stage renal disease: Secondary | ICD-10-CM | POA: Diagnosis not present

## 2021-08-05 DIAGNOSIS — Z992 Dependence on renal dialysis: Secondary | ICD-10-CM | POA: Diagnosis not present

## 2021-08-05 DIAGNOSIS — Z23 Encounter for immunization: Secondary | ICD-10-CM | POA: Diagnosis not present

## 2021-08-06 ENCOUNTER — Encounter: Payer: Self-pay | Admitting: Physician Assistant

## 2021-08-06 ENCOUNTER — Other Ambulatory Visit: Payer: Self-pay

## 2021-08-06 ENCOUNTER — Ambulatory Visit (INDEPENDENT_AMBULATORY_CARE_PROVIDER_SITE_OTHER): Payer: Medicare Other | Admitting: Physician Assistant

## 2021-08-06 VITALS — BP 102/72 | HR 69 | Ht 67.0 in | Wt 187.6 lb

## 2021-08-06 DIAGNOSIS — Z992 Dependence on renal dialysis: Secondary | ICD-10-CM

## 2021-08-06 DIAGNOSIS — N186 End stage renal disease: Secondary | ICD-10-CM | POA: Diagnosis not present

## 2021-08-06 DIAGNOSIS — Q2112 Patent foramen ovale: Secondary | ICD-10-CM

## 2021-08-06 DIAGNOSIS — R072 Precordial pain: Secondary | ICD-10-CM | POA: Diagnosis not present

## 2021-08-06 DIAGNOSIS — R079 Chest pain, unspecified: Secondary | ICD-10-CM | POA: Diagnosis not present

## 2021-08-06 DIAGNOSIS — I480 Paroxysmal atrial fibrillation: Secondary | ICD-10-CM | POA: Diagnosis not present

## 2021-08-06 DIAGNOSIS — C9000 Multiple myeloma not having achieved remission: Secondary | ICD-10-CM | POA: Diagnosis not present

## 2021-08-06 NOTE — Assessment & Plan Note (Signed)
Continue follow-up with oncology 

## 2021-08-06 NOTE — Assessment & Plan Note (Addendum)
He is maintaining sinus rhythm.  He is no longer on anticoagulation secondary to history of GI bleeding as well as recurrent subdural hematoma.  Continue metoprolol succinate 25 mg twice daily.

## 2021-08-06 NOTE — Assessment & Plan Note (Signed)
He has a history of small PFO by TEE.  This has been managed medically as there would not be much benefit to closure.  Last echo performed in 2020.  Obtain follow-up echocardiogram.

## 2021-08-06 NOTE — Assessment & Plan Note (Signed)
He remains on Monday Wednesday Friday dialysis.

## 2021-08-06 NOTE — Assessment & Plan Note (Signed)
He had an episode of vomiting and chest discomfort a little over a week ago.  This seems to have been related to his diet.  His chest discomfort was mainly a soreness.  His electrocardiogram does not demonstrate any significant changes.  He certainly has risk factors for coronary artery disease.  I have recommended that we update his echocardiogram to recheck his EF and assess for wall motion abnormalities.  As long as this does not demonstrate any significant changes, he can follow-up with Dr. Burt Knack or me in 1 year

## 2021-08-06 NOTE — Progress Notes (Addendum)
Cardiology Office Note:    Date:  08/06/2021   ID:  Isaiah [name], DOB 08-07-1966, MRN [id number]  PCP:  [name], [place] Providers Cardiologist:  Sherren Mocha, MD Cardiology APP:  [facility]on [place]    Referring MD: [name], MD   Chief Complaint:  Follow-up for atrial fibrillation, PFO    Patient Profile:   Isaiah [name] is a 55 y.o. male with:  Patent Foramen Ovale  TEE 2018: very small PFO w few bubbles during Valsalva >> not much benefit to closing - Med Rx Atrial fibrillation  ESRD on dialysis  Hx of DVT/pulmonary embolism  S/p IVC filter in 2012 Chronic GI blood loss, anticoagulation DC'd Hx of CVA in 09/2015 (in setting of DVT, PE) Admx in [place]; c/b GI bleed, pancreatitis, AKI Remote lacunar infarcts in bilat cerebellar hemispheres and L thalamus on MRI in 03/2021 Sickle cell trait Diabetes mellitus  Hypertension  OSA  Multiple Myeloma Admx to [facility] 05/2019 for stem cell Tx; aborted due to Proteus bacteremia, asp pneumonia, a/c R subdural hematoma  Chemo concluded 02/2020 Followed by Dr. Alen Blew - continued surveillance  Pulmonary hypertension  RHC at [facility] 05/2019 (Dr. Lora Paula) - mild elevation of filling pressures in setting of high cardiac output in setting of AVF, PA pressures appropriate to high flow state Seizure d/o   History of Present Illness: Mr. [name] was last seen in 4/21.  He returns for follow-up.  He is here with his wife.  Overall, he is doing well.  He goes to dialysis every Monday, Wednesday, Friday.  He does have to take Midodrine to keep his blood pressure from dropping.  He has not had syncope.  He did have an episode of chest pain and vomiting a little over a week ago.  He believes that this was related to something he ate.  Antiemetics did help.  He seems to have had more chest soreness than anything.  He uses a walker to ambulate.  He has not had significant shortness of breath.  He has not had syncope, orthopnea.       ASSESSMENT & PLAN:   Atrial fibrillation ([place]) He is maintaining sinus rhythm.  He is no longer on anticoagulation secondary to history of GI bleeding as well as recurrent subdural hematoma.  Continue metoprolol succinate 25 mg twice daily.  Patent foramen ovale He has a history of small PFO by TEE.  This has been managed medically as there would not be much benefit to closure.  Last echo performed in 2020.  Obtain follow-up echocardiogram.  ESRD (end stage renal disease) on dialysis [facility]) He remains on Monday Wednesday Friday dialysis.  Multiple myeloma ([place]) Continue follow-up with oncology.  Chest pain He had an episode of vomiting and chest discomfort a little over a week ago.  This seems to have been related to his diet.  His chest discomfort was mainly a soreness.  His electrocardiogram does not demonstrate any significant changes.  He certainly has risk factors for coronary artery disease.  I have recommended that we update his echocardiogram to recheck his EF and assess for wall motion abnormalities.  As long as this does not demonstrate any significant changes, he can follow-up with Dr. Burt Knack or me in 1 year          Dispo:  Return in about 1 year (around 08/06/2022) for Routine Follow Up, w/ Dr. Burt Knack, or Richardson Dopp, PA-C.    Prior CV studies: Echocardiogram 05/12/2019 (  [facility]) EF 50, diastolic dysfunction, normal RVSF, Trivial MR, Trivial PI, mild TR   R cardiac catheterization 04/21/2019 ([facility]) RA 10, RV 67/9, mPA 38, PCWP 19, CO 9.6, CI 4.5, PVR 2.0.  These findings are consistent with mild elevation in intracardiac filling pressures in the setting of high cardiac output in the setting of AVF. PA pressures are appropriate to high flow state (normal range PVR).   Echocardiogram 03/31/2019 ([facility]) EF 50, diastolic dysfunction, NL RVSF, trivial MR/PI, mild TR, RVSP 56   Myoview 04/02/18 ([facility]) Normal perfusion, EF 55   Myoview 04/21/17 EF 56, no ischemia   Echocardiogram  04/21/17 EF 60-65, no RWMA, trivial TR     Past Medical History:  Diagnosis Date   A-fib ([place])    Anemia    Diabetic retinopathy ([place])    PDR OU   DM type 2 (diabetes mellitus, type 2) ([place]) 06/09/2019   ESRD (end stage renal disease) on dialysis [facility])    Essential hypertension 06/09/2019   GIB (gastrointestinal bleeding)    Recurrent episodes- 09/2014, 09/2015 and 05/2016   Gout    History of recent blood transfusion 10/27/14   2 Units PRBC's   Hyperkalemia 07/2011   Hypertensive retinopathy    OU   Multiple myeloma ([place])    Pulmonary embolism ([place]) 07/2011; 09/27/2014   a. 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 Sickle cell trait.   Stroke ([place]) 09/2015   R-MCA, L-MCA, PCA and bilateral cerebellar complicated by DVT/PE   Subdural hematoma 05/2019   Current Medications: Current Meds  Medication Sig   acetaminophen (TYLENOL) 325 MG tablet Take 1-2 tablets (325-650 mg total) by mouth every 4 (four) hours as needed for mild pain.   albuterol (PROAIR HFA) 108 (90 Base) MCG/ACT inhaler Inhale two puffs every 4-6 hours if needed for cough or wheeze   Alcohol Swabs (SM ALCOHOL PREP) 70 % PADS    atorvastatin (LIPITOR) 20 MG tablet TAKE 1 TABLET BY MOUTH ONCE A DAY   AURYXIA 1 GM 210 MG(Fe) tablet Take 210 mg by mouth every evening.   brimonidine (ALPHAGAN) 0.2 % ophthalmic solution    calcitRIOL (ROCALTROL) 0.25 MCG capsule Take 0.25 mcg by mouth daily.   Cetirizine HCl 10 MG CAPS Take 1 capsule by mouth daily.    Cholecalciferol (D3-1000 PO) Take 1 tablet by mouth See admin instructions. Take one tablet by mouth on Monday,Wednesday and Fridays   clonazePAM (KLONOPIN) 0.5 MG tablet Take 2 tablets in morning on Monday, Wednesday, Friday before dialysis   escitalopram (LEXAPRO) 10 MG tablet Take 1 tablet (10 mg total) by mouth daily.   ferric citrate (AURYXIA) 1 GM  210 MG(Fe) tablet Take 1 tablet by mouth 3 times a day with meals as directed   fluticasone (FLONASE) 50 MCG/ACT nasal spray Place 1 spray into both nostrils as needed for allergies.   fluticasone furoate-vilanterol (BREO ELLIPTA) 200-25 MCG/INH AEPB INHALE 1 PUFF BY MOUTH INTO THE LUNGS AT THE SAME TIME EVERY DAY   FREESTYLE LITE test strip    Glucagon, rDNA, (GLUCAGON EMERGENCY) 1 MG KIT See admin instructions.   hydrOXYzine (ATARAX/VISTARIL) 25 MG tablet Take by mouth as needed.   Insulin Glargine-yfgn (SEMGLEE, YFGN,) 100 UNIT/ML SOPN Inject 6 units subcutaneously once a day   Insulin Lispro (HUMALOG KWIKPEN [place]) Inject 2-4 Units into the skin 3 (  three) times daily before meals. Sliding scale: 150-250 =2 units, 250 -350=3 units, 350 += 4 units   lacosamide (VIMPAT) 200 MG TABS tablet TAKE 1 TABLET TWICE A DAY EXCEPT ON DIALYSIS DAYS M-W-F GIVE 1 TAB IN AM, 1 TAB AFTER DIALYSIS, 1 TAB IN PM.   lamoTRIgine (LAMICTAL) 200 MG tablet TAKE 1 TABLET BY MOUTH 2 TIMES DAILY   Lancets (FREESTYLE) lancets    Methoxy PEG-Epoetin Beta (MIRCERA IJ)    metoprolol succinate (TOPROL-XL) 25 MG 24 hr tablet Take 25 mg by mouth 2 (two) times daily.   midodrine (PROAMATINE) 10 MG tablet Take 1 tablet (10 mg total) by mouth 2 (two) times daily.   multivitamin (RENA-VIT) TABS tablet Take 1 tablet by mouth at bedtime.   pantoprazole (PROTONIX) 40 MG tablet TAKE 1 TABLET BY MOUTH ONCE DAILY   prochlorperazine (COMPAZINE) 10 MG tablet Take 10 mg by mouth every 6 (six) hours as needed for nausea.   QUEtiapine (SEROQUEL) 25 MG tablet Take 25 mg by mouth as needed (relalaxion).   UNIFINE PENTIPS 31G X 8 MM MISC USE AS DIRECTED DAILY AFTER SUPPER    Allergies:   Codeine and Codeine   Social History   Tobacco Use   Smoking status: Never   Smokeless tobacco: Never  Vaping Use   Vaping Use: Never used  Substance Use Topics   Alcohol use: Not Currently   Drug use: Never    Family Hx: The patient's family history  includes Diabetes in his mother; Diabetes type I in his brother; Hypertension in his father and mother; Kidney disease in his brother; Sickle cell anemia in his brother and brother; Stroke in his father.  ROS see HPI  EKGs/Labs/Other Test Reviewed:    EKG:  EKG is  ordered today.  The ekg ordered today demonstrates NSR, HR 69, normal axis, no ST-T wave changes, QTC 475, no change from prior tracing  Recent Labs: 12/31/2020: Magnesium 2.1 05/07/2021: TSH 2.880 06/20/2021: ALT 13; BUN 35; Creatinine 8.34; Hemoglobin 11.2; Platelet Count 316; Potassium 3.9; Sodium 139   Recent Lipid Panel Lab Results  Component Value Date/Time   CHOL 135 02/09/2021 12:00 AM   CHOL 169 03/17/2019 12:39 PM   TRIG 78 02/09/2021 12:00 AM   HDL 51 02/09/2021 12:00 AM   HDL 62 03/17/2019 12:39 PM   LDLCALC 68 02/09/2021 12:00 AM   LDLCALC 89 03/17/2019 12:39 PM     Risk Assessment/Calculations:    CHA2DS2-VASc Score = 4   This indicates a 4.8% annual risk of stroke. The patient's score is based upon: CHF History: 0 HTN History: 1 Diabetes History: 1 Stroke History: 2 Vascular Disease History: 0 Age Score: 0 Gender Score: 0        Physical Exam:    VS:  BP 102/72   Pulse 69   Ht 5' 7"  (1.702 m)   Wt 187 lb 9.6 oz (85.1 kg)   SpO2 94%   BMI 29.38 kg/m     Wt Readings from Last 3 Encounters:  08/06/21 187 lb 9.6 oz (85.1 kg)  06/27/21 186 lb 1.6 oz (84.4 kg)  06/13/21 196 lb 9.6 oz (89.2 kg)    Constitutional:      Appearance: Not in distress.  Pulmonary:     Breath sounds: No wheezing. No rales.  Cardiovascular:     Normal rate. Regular rhythm. Normal S1. Normal S2.      Murmurs: There is no murmur.  Edema:    Peripheral edema absent.  Abdominal:     Palpations: Abdomen is soft.  Musculoskeletal:     Cervical back: Neck supple. Skin:    General: Skin is warm and dry.  Neurological:     General: No focal deficit present.     Mental Status: Alert.       Medication  Adjustments/Labs and Tests Ordered: Current medicines are reviewed at length with the patient today.  Concerns regarding medicines are outlined above.  Tests Ordered: Orders Placed This Encounter  Procedures   EKG 12-Lead   ECHOCARDIOGRAM COMPLETE   Medication Changes: No orders of the defined types were placed in this encounter.  Signed, Richardson Dopp, PA-C  08/06/2021 4:34 PM    [facility] [address], [place], [place]  [address] Phone: [phone]; Fax: [phone]

## 2021-08-06 NOTE — Patient Instructions (Signed)
Medication Instructions:  Your physician recommends that you continue on your current medications as directed. Please refer to the Current Medication list given to you today.  *If you need a refill on your cardiac medications before your next appointment, please call your pharmacy*   Lab Work: NONE If you have labs (blood work) drawn today and your tests are completely normal, you will receive your results only by: [place] (if you have MyChart) OR A paper copy in the mail If you have any lab test that is abnormal or we need to change your treatment, we will call you to review the results.   Testing/Procedures: Your physician has requested that you have an echocardiogram. Echocardiography is a painless test that uses sound waves to create images of your heart. It provides your doctor with information about the size and shape of your heart and how well your heart's chambers and valves are working. This procedure takes approximately one hour. There are no restrictions for this procedure.    Follow-Up: At [facility], you and your health needs are our priority.  As part of our continuing mission to provide you with exceptional heart care, we have created designated Provider Care Teams.  These Care Teams include your primary Cardiologist (physician) and Advanced Practice Providers (APPs -  Physician Assistants and Nurse Practitioners) who all work together to provide you with the care you need, when you need it.  We recommend signing up for the patient portal called "MyChart".  Sign up information is provided on this After Visit Summary.  MyChart is used to connect with patients for Virtual Visits (Telemedicine).  Patients are able to view lab/test results, encounter notes, upcoming appointments, etc.  Non-urgent messages can be sent to your provider as well.   To learn more about what you can do with MyChart, go to [email or web address].    Your next appointment:   1 year(s)  The  format for your next appointment:   In Person  Provider:   Sherren Mocha, MD or Richardson Dopp, PA-C   Other Instructions Echocardiogram An echocardiogram is a test that uses sound waves (ultrasound) to produce images of the heart. Images from an echocardiogram can provide important information about: Heart size and shape. The size and thickness and movement of your heart's walls. Heart muscle function and strength. Heart valve function or if you have stenosis. Stenosis is when the heart valves are too narrow. If blood is flowing backward through the heart valves (regurgitation). A tumor or infectious growth around the heart valves. Areas of heart muscle that are not working well because of poor blood flow or injury from a heart attack. Aneurysm detection. An aneurysm is a weak or damaged part of an artery wall. The wall bulges out from the normal force of blood pumping through the body. Tell a health care provider about: Any allergies you have. All medicines you are taking, including vitamins, herbs, eye drops, creams, and over-the-counter medicines. Any blood disorders you have. Any surgeries you have had. Any medical conditions you have. Whether you are pregnant or may be pregnant. What are the risks? Generally, this is a safe test. However, problems may occur, including an allergic reaction to dye (contrast) that may be used during the test. What happens before the test? No specific preparation is needed. You may eat and drink normally. What happens during the test?  You will take off your clothes from the waist up and put on a hospital gown. Electrodes or electrocardiogram (ECG)patches  may be placed on your chest. The electrodes or patches are then connected to a device that monitors your heart rate and rhythm. You will lie down on a table for an ultrasound exam. A gel will be applied to your chest to help sound waves pass through your skin. A handheld device, called a  transducer, will be pressed against your chest and moved over your heart. The transducer produces sound waves that travel to your heart and bounce back (or "echo" back) to the transducer. These sound waves will be captured in real-time and changed into images of your heart that can be viewed on a video monitor. The images will be recorded on a computer and reviewed by your health care provider. You may be asked to change positions or hold your breath for a short time. This makes it easier to get different views or better views of your heart. In some cases, you may receive contrast through an IV in one of your veins. This can improve the quality of the pictures from your heart. The procedure may vary among health care providers and hospitals. What can I expect after the test? You may return to your normal, everyday life, including diet, activities, and medicines, unless your health care provider tells you not to do that. Follow these instructions at home: It is up to you to get the results of your test. Ask your health care provider, or the department that is doing the test, when your results will be ready. Keep all follow-up visits. This is important. Summary An echocardiogram is a test that uses sound waves (ultrasound) to produce images of the heart. Images from an echocardiogram can provide important information about the size and shape of your heart, heart muscle function, heart valve function, and other possible heart problems. You do not need to do anything to prepare before this test. You may eat and drink normally. After the echocardiogram is completed, you may return to your normal, everyday life, unless your health care provider tells you not to do that. This information is not intended to replace advice given to you by your health care provider. Make sure you discuss any questions you have with your health care provider. Document Revised: 05/08/2021 Document Reviewed: 04/17/2020 Elsevier  Patient Education  2022 [employer].

## 2021-08-07 DIAGNOSIS — N186 End stage renal disease: Secondary | ICD-10-CM | POA: Diagnosis not present

## 2021-08-07 DIAGNOSIS — Z992 Dependence on renal dialysis: Secondary | ICD-10-CM | POA: Diagnosis not present

## 2021-08-07 DIAGNOSIS — N2581 Secondary hyperparathyroidism of renal origin: Secondary | ICD-10-CM | POA: Diagnosis not present

## 2021-08-07 DIAGNOSIS — Z23 Encounter for immunization: Secondary | ICD-10-CM | POA: Diagnosis not present

## 2021-08-08 DIAGNOSIS — E1122 Type 2 diabetes mellitus with diabetic chronic kidney disease: Secondary | ICD-10-CM | POA: Diagnosis not present

## 2021-08-08 DIAGNOSIS — Z992 Dependence on renal dialysis: Secondary | ICD-10-CM | POA: Diagnosis not present

## 2021-08-08 DIAGNOSIS — N186 End stage renal disease: Secondary | ICD-10-CM | POA: Diagnosis not present

## 2021-08-08 DIAGNOSIS — Z9289 Personal history of other medical treatment: Secondary | ICD-10-CM

## 2021-08-08 HISTORY — DX: Personal history of other medical treatment: Z92.89

## 2021-08-09 ENCOUNTER — Other Ambulatory Visit (HOSPITAL_COMMUNITY): Payer: Self-pay

## 2021-08-09 DIAGNOSIS — N2581 Secondary hyperparathyroidism of renal origin: Secondary | ICD-10-CM | POA: Diagnosis not present

## 2021-08-09 DIAGNOSIS — N186 End stage renal disease: Secondary | ICD-10-CM | POA: Diagnosis not present

## 2021-08-09 DIAGNOSIS — Z992 Dependence on renal dialysis: Secondary | ICD-10-CM | POA: Diagnosis not present

## 2021-08-09 DIAGNOSIS — Z23 Encounter for immunization: Secondary | ICD-10-CM | POA: Diagnosis not present

## 2021-08-09 MED ORDER — INSULIN PEN NEEDLE 32G X 4 MM MISC
6 refills | Status: AC
  Filled 2021-08-09: qty 400, 90d supply, fill #0
  Filled 2021-08-09 (×2): qty 400, 80d supply, fill #0
  Filled 2022-03-02: qty 400, 80d supply, fill #1

## 2021-08-09 MED ORDER — FLUTICASONE FUROATE-VILANTEROL 200-25 MCG/ACT IN AEPB
INHALATION_SPRAY | RESPIRATORY_TRACT | 1 refills | Status: DC
  Filled 2021-08-09: qty 60, 30d supply, fill #0
  Filled 2021-10-27: qty 60, 30d supply, fill #1

## 2021-08-09 MED ORDER — FREESTYLE LANCETS MISC
4 refills | Status: DC
  Filled 2021-08-09: qty 300, 90d supply, fill #0

## 2021-08-09 MED ORDER — FREESTYLE LITE TEST VI STRP
ORAL_STRIP | 3 refills | Status: AC
  Filled 2021-08-09: qty 300, 90d supply, fill #0
  Filled 2022-03-02: qty 300, 90d supply, fill #1

## 2021-08-12 ENCOUNTER — Other Ambulatory Visit (HOSPITAL_COMMUNITY): Payer: Self-pay

## 2021-08-12 DIAGNOSIS — N186 End stage renal disease: Secondary | ICD-10-CM | POA: Diagnosis not present

## 2021-08-12 DIAGNOSIS — N2581 Secondary hyperparathyroidism of renal origin: Secondary | ICD-10-CM | POA: Diagnosis not present

## 2021-08-12 DIAGNOSIS — Z23 Encounter for immunization: Secondary | ICD-10-CM | POA: Diagnosis not present

## 2021-08-12 DIAGNOSIS — Z992 Dependence on renal dialysis: Secondary | ICD-10-CM | POA: Diagnosis not present

## 2021-08-14 ENCOUNTER — Other Ambulatory Visit: Payer: Self-pay | Admitting: Internal Medicine

## 2021-08-14 ENCOUNTER — Other Ambulatory Visit: Payer: Self-pay | Admitting: Oncology

## 2021-08-14 ENCOUNTER — Other Ambulatory Visit (HOSPITAL_COMMUNITY): Payer: Self-pay

## 2021-08-14 DIAGNOSIS — Z23 Encounter for immunization: Secondary | ICD-10-CM | POA: Diagnosis not present

## 2021-08-14 DIAGNOSIS — N2581 Secondary hyperparathyroidism of renal origin: Secondary | ICD-10-CM | POA: Diagnosis not present

## 2021-08-14 DIAGNOSIS — Z992 Dependence on renal dialysis: Secondary | ICD-10-CM | POA: Diagnosis not present

## 2021-08-14 DIAGNOSIS — N186 End stage renal disease: Secondary | ICD-10-CM | POA: Diagnosis not present

## 2021-08-14 MED ORDER — PROCHLORPERAZINE MALEATE 10 MG PO TABS
ORAL_TABLET | Freq: Four times a day (QID) | ORAL | 0 refills | Status: DC | PRN
  Filled 2021-08-14: qty 30, 7d supply, fill #0

## 2021-08-15 ENCOUNTER — Other Ambulatory Visit (HOSPITAL_COMMUNITY): Payer: Self-pay

## 2021-08-16 DIAGNOSIS — N2581 Secondary hyperparathyroidism of renal origin: Secondary | ICD-10-CM | POA: Diagnosis not present

## 2021-08-16 DIAGNOSIS — Z23 Encounter for immunization: Secondary | ICD-10-CM | POA: Diagnosis not present

## 2021-08-16 DIAGNOSIS — Z992 Dependence on renal dialysis: Secondary | ICD-10-CM | POA: Diagnosis not present

## 2021-08-16 DIAGNOSIS — N186 End stage renal disease: Secondary | ICD-10-CM | POA: Diagnosis not present

## 2021-08-16 NOTE — Progress Notes (Signed)
[facility] Note  08/27/2021     CHIEF COMPLAINT Patient presents for Retina Follow Up   HISTORY OF PRESENT ILLNESS: Isaiah [name] is a 55 y.o. male who presents to the clinic today for:   HPI     Retina Follow Up   Patient presents with  Diabetic Retinopathy.  In both eyes.  Duration of 6 weeks.  Since onset it is stable.  I, the attending physician,  performed the HPI with the patient and updated documentation appropriately.        Comments   Patient has better vision out of right eye when he lays on the pillow on right side.  O/w no new problems.  BS 100 this morning and 198 now, in office A1C 5.6      Last edited by [name], MD on 08/27/2021  3:04 PM.    Pt states no change in vision OD   Referring physician: [name], MD [address] [place],  [place] [address]  HISTORICAL INFORMATION:   Selected notes from the [place] [facility] of Dr. [name].  Urgent re-referral from Dr. [name] for possible PDR w/NVI OD.  Presented to [name] on 7.27.21 w/elevated IOPS and OD swelling and irritation.  Started on Brimonidine BID OD and Pred Q1hr OD.   CURRENT MEDICATIONS: Current Outpatient Medications (Ophthalmic Drugs)  Medication Sig   brimonidine (ALPHAGAN) 0.2 % ophthalmic solution  (Patient not taking: Reported on 08/27/2021)   No current facility-administered medications for this visit. (Ophthalmic Drugs)   Current Outpatient Medications (Other)  Medication Sig   acetaminophen (TYLENOL) 325 MG tablet Take 1-2 tablets (325-650 mg total) by mouth every 4 (four) hours as needed for mild pain.   albuterol (PROAIR HFA) 108 (90 Base) MCG/ACT inhaler Inhale two puffs every 4-6 hours if needed for cough or wheeze   atorvastatin (LIPITOR) 20 MG tablet TAKE 1 TABLET BY MOUTH ONCE A DAY   AURYXIA 1 GM 210 MG(Fe) tablet Take 210 mg by mouth every evening.   calcitRIOL (ROCALTROL) 0.25 MCG capsule Take 0.25  mcg by mouth daily.   Cetirizine HCl 10 MG CAPS Take 1 capsule by mouth daily.    Cholecalciferol (D3-1000 PO) Take 1 tablet by mouth See admin instructions. Take one tablet by mouth on Monday,Wednesday and Fridays   clonazePAM (KLONOPIN) 0.5 MG tablet Take 2 tablets in morning on Monday, Wednesday, Friday before dialysis   escitalopram (LEXAPRO) 10 MG tablet Take 1 tablet (10 mg total) by mouth daily.   ferric citrate (AURYXIA) 1 GM 210 MG(Fe) tablet Take 1 tablet by mouth 3 times a day with meals as directed   fluticasone (FLONASE) 50 MCG/ACT nasal spray Place 1 spray into both nostrils as needed for allergies.   fluticasone furoate-vilanterol (BREO ELLIPTA) 200-25 MCG/ACT AEPB Inhale 1 puff by mouth into the lungs at the same time everyday   hydrOXYzine (ATARAX/VISTARIL) 25 MG tablet Take by mouth as needed.   insulin glargine-yfgn (SEMGLEE, YFGN,) 100 UNIT/ML Pen Inject 6 units under the skin once daily (discard pen after 28 days from first use)   Insulin Lispro (HUMALOG KWIKPEN [place]) Inject 2-4 Units into the skin 3 (three) times daily before meals. Sliding scale: 150-250 =2 units, 250 -350=3 units, 350 += 4 units   lacosamide (VIMPAT) 200 MG TABS tablet TAKE 1 TABLET TWICE A DAY EXCEPT ON DIALYSIS DAYS M-W-F GIVE 1 TAB IN AM, 1 TAB AFTER DIALYSIS, 1 TAB IN  PM.   lamoTRIgine (LAMICTAL) 200 MG tablet TAKE 1 TABLET BY MOUTH 2 TIMES DAILY   Methoxy PEG-Epoetin Beta (MIRCERA IJ)    metoprolol succinate (TOPROL-XL) 25 MG 24 hr tablet Take 25 mg by mouth 2 (two) times daily.   midodrine (PROAMATINE) 10 MG tablet Take 1 tablet (10 mg total) by mouth 2 (two) times daily.   multivitamin (RENA-VIT) TABS tablet Take 1 tablet by mouth at bedtime.   pantoprazole (PROTONIX) 40 MG tablet TAKE 1 TABLET BY MOUTH ONCE DAILY   prochlorperazine (COMPAZINE) 10 MG tablet Take 10 mg by mouth every 6 (six) hours as needed for nausea.   QUEtiapine (SEROQUEL) 25 MG tablet Take 25 mg by mouth as needed (relalaxion).    UNIFINE PENTIPS 31G X 8 MM MISC USE AS DIRECTED DAILY AFTER SUPPER   Alcohol Swabs (SM ALCOHOL PREP) 70 % PADS    fluticasone furoate-vilanterol (BREO ELLIPTA) 200-25 MCG/INH AEPB INHALE 1 PUFF BY MOUTH INTO THE LUNGS AT THE SAME TIME EVERY DAY   FREESTYLE LITE test strip    Glucagon, rDNA, (GLUCAGON EMERGENCY) 1 MG KIT See admin instructions.   glucose blood (FREESTYLE LITE) test strip Use as directed to test blood glucose levels 3 times daily.   Insulin Glargine-yfgn (SEMGLEE, YFGN,) 100 UNIT/ML SOPN Inject 6 units subcutaneously once a day   Insulin Pen Needle 32G X 4 MM MISC USE AS DIRECTED 4 TO 5 TIMES DAILY TO INJECT INSULIN   Lancets (FREESTYLE) lancets    Lancets (FREESTYLE) lancets Use as directed to test blood glucose levels three times daily.   prochlorperazine (COMPAZINE) 10 MG tablet TAKE 1 TABLET BY MOUTH EVERY 6 HOURS AS NEEDED FOR NAUSEA OR VOMITING.   No current facility-administered medications for this visit. (Other)   REVIEW OF SYSTEMS: ROS   Positive for: Gastrointestinal, Neurological, Genitourinary, Endocrine, Cardiovascular, Eyes, Respiratory Negative for: Constitutional, Skin, Musculoskeletal, HENT, Psychiatric, Allergic/Imm, Heme/Lymph Last edited by Leonie Douglas, COA on 08/27/2021  1:14 PM.     ALLERGIES Allergies  Allergen Reactions   Codeine Swelling   Codeine Rash and Other (See Comments)    Unknown reaction (patient says it was more serious than just a rash, but he can't remember what happened)    PAST MEDICAL HISTORY Past Medical History:  Diagnosis Date   A-fib ([place])    Anemia    Diabetic retinopathy ([place])    PDR OU   DM type 2 (diabetes mellitus, type 2) ([place]) 06/09/2019   ESRD (end stage renal disease) on dialysis [facility])    Essential hypertension 06/09/2019   GIB (gastrointestinal bleeding)    Recurrent episodes- 09/2014, 09/2015 and 05/2016   Gout    History of recent blood transfusion 10/27/14   2 Units PRBC's   Hyperkalemia 07/2011    Hypertensive retinopathy    OU   Multiple myeloma ([place])    Pulmonary embolism ([place]) 07/2011; 09/27/2014   a. Bilat PE 07/2011 - unclear cause, tx with 6 months Coumadin.;    Seizure disorder ([place]) 06/09/2019   Sepsis ([place])    Sickle cell-thalassemia disease ([place])    a. Sickle cell trait.   Stroke ([place]) 09/2015   R-MCA, L-MCA, PCA and bilateral cerebellar complicated by DVT/PE   Subdural hematoma 05/2019   Past Surgical History:  Procedure Laterality Date   AV FISTULA PLACEMENT Right 12/05/2015   Procedure: INSERTION OF ARTERIOVENOUS (AV) GORE-TEX GRAFT ARM;  Surgeon: Elam Dutch, MD;  Location: [place];  Service: Vascular;  Laterality: Right;  BONE MARROW BIOPSY     CATARACT EXTRACTION Right 09/2019   Dr. Herbert Deaner   CATARACT EXTRACTION Left 10/2019   Dr. Herbert Deaner   CHOLECYSTECTOMY     CHOLECYSTECTOMY  [phone]?   COLONOSCOPY WITH PROPOFOL N/A 01/22/2017   Procedure: COLONOSCOPY WITH PROPOFOL;  Surgeon: Wilford Corner, MD;  Location: [facility] ENDOSCOPY;  Service: Endoscopy;  Laterality: N/A;   EYE SURGERY Bilateral    Cat Sx - Dr. Herbert Deaner   INSERTION OF DIALYSIS CATHETER Right 10/16/2015   Procedure: INSERTION OF PALINDROME DIALYSIS CATHETER ;  Surgeon: Elam Dutch, MD;  Location: [place] [facility];  Service: Vascular;  Laterality: Right;   OTHER SURGICAL HISTORY     Retinal surgery   PARS PLANA VITRECTOMY  02/17/2012   Procedure: PARS PLANA VITRECTOMY WITH 25 GAUGE;  Surgeon: Hayden Pedro, MD;  Location: [facility];  Service: Ophthalmology;  Laterality: Right;   PERIPHERAL VASCULAR CATHETERIZATION N/A 03/20/2016   Procedure: A/V Shuntogram/Fistulagram;  Surgeon: Conrad Chalco, MD;  Location: [facility];  Service: Cardiovascular;  Laterality: N/A;   TEE WITHOUT CARDIOVERSION N/A 12/02/2016   Procedure: TRANSESOPHAGEAL ECHOCARDIOGRAM (TEE);  Surgeon: Larey Dresser, MD;  Location[facility] ENDOSCOPY;  Service: Cardiovascular;  Laterality: N/A;   FAMILY HISTORY Family History  Problem Relation Age of  Onset   Hypertension Mother    Hypertension Father    Stroke Father    Sickle cell anemia Brother    Diabetes Mother    Sickle cell anemia Brother    Diabetes type I Brother    Kidney disease Brother    SOCIAL HISTORY Social History   Tobacco Use   Smoking status: Never   Smokeless tobacco: Never  Vaping Use   Vaping Use: Never used  Substance Use Topics   Alcohol use: Not Currently   Drug use: Never       OPHTHALMIC EXAM:  Base Eye Exam     Visual Acuity (Snellen - Linear)       Right Left   Dist cc CF 3' 20/150   Dist ph cc NI 20/80 +1         Tonometry (Tonopen, 1:23 PM)       Right Left   Pressure 14 11         Pupils       Dark Light Shape React APD   Right 3 2 Round Minimal None   Left 3 2 Round Minimal None         Visual Fields (Counting fingers)       Left Right    Full    Restrictions  Total inferior temporal, superior nasal, inferior nasal deficiencies; Partial outer superior temporal deficiency         Extraocular Movement       Right Left    RXT     -- -- --  --  --  -- -- --   0 0 0  0  0  0 0 0           Neuro/Psych     Oriented x3: Yes   Mood/Affect: Normal         Dilation     Both eyes: 1.0% Mydriacyl, 2.5% Phenylephrine @ 1:23 PM           Slit Lamp and Fundus Exam     Slit Lamp Exam       Right Left   Lids/Lashes Dermato Dermato   Conjunctiva/Sclera Melanosis, very attenuated vessels, trace injection  Melanosis   Cornea Arcus, well healed cataract wound, 2-3+ inferior PEE 1-2+ inferior PEE, well healed temporal cataract wounds   Anterior Chamber Deep; 1+ fine cell/pigment Deep and quiet   Iris Round and poorly dilated to 4.16m, NVI fully regressed Round and dilated, No NVI   Lens PC IOL in good position, elschnig pearls ST PCIOL   Anterior Vitreous Post vitrectomy.  Residual anterior vitreous syneresis.  +RBC - improved, +pigment Mild asteroid hyalosis, syneresis         Fundus Exam        Right Left   Disc 4+pallor. Sharp rim, +cupping Hazy view, mild pallor, sharp rim   C/D Ratio 0.7 0.6   Macula Blunted foveal reflex, atrophic; ischemic, no heme Flat, Blunted foveal reflex, No heme or edema   Vessels Attenuated, tortuous, severe midzonal attenuation and ischemia, sclerotic Attenuated, tortuous, ?sclerosis   Periphery Attached, Peripheral laser scars, good posterior fill in 360 Attached.  360 PRP           Refraction     Wearing Rx       Sphere Cylinder Axis Add   Right -1.75 +0.50 090 +2.50   Left -1.50 Sphere  +2.50            IMAGING AND PROCEDURES  Imaging and Procedures for 08/27/2021  OCT, Retina - OU - Both Eyes       Right Eye Quality was borderline. Central Foveal Thickness: 208. Progression has been stable. Findings include normal foveal contour, no IRF, no SRF, outer retinal atrophy, epiretinal membrane, inner retinal atrophy, intraretinal hyper-reflective material (diffuse retinal atrophy -- nasal thickening stably improved, mild ERM).   Left Eye Quality was borderline. Central Foveal Thickness: 246. Progression has been stable. Findings include normal foveal contour, no SRF, epiretinal membrane, no IRF (Diffuse retinal atrophy/thinning; stable retinoschisis temporal midzone caught on widefield).   Notes *Images captured and stored on drive  Diagnosis / Impression:  OD: retinal ischemia / CRAO -- diffuse retinal atrophy -- nasal thickening stably improved, mild ERM OS: Diffuse retinal atrophy/thinning; retinoschisis temporal midzone caught on widefield -- stable  Clinical management:  See below  Abbreviations: NFP - Normal foveal profile. CME - cystoid macular edema. PED - pigment epithelial detachment. IRF - intraretinal fluid. SRF - subretinal fluid. EZ - ellipsoid zone. ERM - epiretinal membrane. ORA - outer retinal atrophy. ORT - outer retinal tubulation. SRHM - subretinal hyper-reflective material. IRHM - intraretinal  hyper-reflective material     Intravitreal Injection, Pharmacologic Agent - OD - Right Eye       Time Out 08/27/2021. 2:12 PM. Confirmed correct patient, procedure, site, and patient consented.   Anesthesia Topical anesthesia was used. Anesthetic medications included Lidocaine 2%, Proparacaine 0.5%.   Procedure Preparation included 5% betadine to ocular surface, eyelid speculum. A supplied (32g) needle was used.   Injection: 1.25 mg Bevacizumab 1.256m0.05ml   Route: Intravitreal, Site: Right Eye   NDC: [id number]: : [id number] date: 10/05/2021, Waste: 0 mL   Post-op Post injection exam found visual acuity of at least counting fingers, no retinal detachment. The patient tolerated the procedure well. There were no complications. The patient received written and verbal post procedure care education. Post injection medications were not given.            ASSESSMENT/PLAN:    ICD-10-CM   1. Proliferative diabetic retinopathy of both eyes without macular edema associated with type 2 diabetes mellitus ([place] [id number], Retina - OU - Both Eyes  Intravitreal Injection, Pharmacologic Agent - OD - Right Eye    Bevacizumab (AVASTIN) SOLN 1.25 mg    2. Retinal ischemia  H35.82     3. Sickle cell retinopathy without crisis ([place])  D57.1    H36     4. Essential hypertension  I10     5. Hypertensive retinopathy of both eyes  H35.033     6. Pseudophakia of both eyes  Z96.1     7. Bilateral dry eyes  H04.123      1,2. Proliferative diabetic retinopathy w/ severe retinal ischemia, no DME, OU (OD > OS)  - former pt of Dr. [name], last visit 03.04.21  - history of PPV w/ endolaser, FAX OD 6.11.13; PRP OS 2020  - s/p IVA OD #1 (07.29.21) as above for new VH and microhyphema, #2 (07.19.22), #3 (08.23.22), #4 (10.04.22), #5 (11.08.22) -- for NVI  - s/p PRP OD (08.24.21), fill in (08.02.22)  - BCVA OD stable at CF; OS stable at 20/80 - OCT without diabetic macular  edema, but diffuse atrophy, both eyes -- OD with RAO-like apprearance -- severe ischemia  - FA (08.12.21) shows severe vascular non-perfusion OD -- peripheral nonperfusion extends posteriorly just outside arcades   - exam and gonio showed new NVI and NVA OD on 7.19.22--regressed post IVA OD on 7.19.22  - fully regressed NVI today  - IOP OD 14 today --  pt no longer using brimonidine BID OD -- okay to stay off - continue PF TID OD per Dr. Herbert Deaner -- pt is no longer using - recommend IVA OD #6 today, 12.20.22 for NVI w/ ext to 8 wks - pt wishes to proceed - RBA of procedure discussed, questions answered - informed consent obtained and signed - see procedure note - F/u in 8 weeks, DFE, OCT, possible injection  3. History of Sickle Cell Retinopathy  - 360 PRP in place OU  - no active NV, confirmed on FA 8.12.21  4,5. Hypertensive retinopathy OU - discussed importance of tight BP control - monitor  6. Pseudophakia OU  - s/p CE/IOL OU (OD Jan 2021; OS Feb 2021  - IOL in good position, doing well)  - monitor  7. Dry eyes OU - recommend artificial tears and lubricating ointment as needed  Ophthalmic Meds Ordered this visit:  Meds ordered this encounter  Medications   Bevacizumab (AVASTIN) SOLN 1.25 mg      Return in about 8 weeks (around 10/22/2021) for f/u PDR OU, DFE, OCT.  There are no Patient Instructions on file for this visit.  Explained the diagnoses, plan, and follow up with the patient and they expressed understanding.  Patient expressed understanding of the importance of proper follow up care.  This document serves as a record of services personally performed by Gardiner Sleeper, MD, PhD. It was created on their behalf by Estill Bakes, COT an ophthalmic technician. The creation of this record is the provider's dictation and/or activities during the visit.    Electronically signed by: Estill Bakes, COT 12.9.22 @ 3:41 PM   This document serves as a record of services  personally performed by Gardiner Sleeper, MD, PhD. It was created on their behalf by San Jetty. Owens Shark, OA an ophthalmic technician. The creation of this record is the provider's dictation and/or activities during the visit.    Electronically signed by: San Jetty. Owens Shark, [place] 12.20.2022 3:41 PM  Gardiner Sleeper, M.D., Ph.D. Diseases & Surgery of the Retina and Vitreous [facility]  I have reviewed the above documentation for accuracy and completeness, and I agree with the above. Gardiner Sleeper, M.D., Ph.D. 08/27/21 3:41 PM   Abbreviations: M myopia (nearsighted); A astigmatism; H hyperopia (farsighted); P presbyopia; Mrx spectacle prescription;  CTL contact lenses; OD right eye; OS left eye; OU both eyes  XT exotropia; ET esotropia; PEK punctate epithelial keratitis; PEE punctate epithelial erosions; DES dry eye syndrome; MGD meibomian gland dysfunction; ATs artificial tears; PFAT's preservative free artificial tears; [place] nuclear sclerotic cataract; PSC posterior subcapsular cataract; ERM epi-retinal membrane; PVD posterior vitreous detachment; RD retinal detachment; DM diabetes mellitus; DR diabetic retinopathy; NPDR non-proliferative diabetic retinopathy; PDR proliferative diabetic retinopathy; CSME clinically significant macular edema; DME diabetic macular edema; dbh dot blot hemorrhages; CWS cotton wool spot; POAG primary open angle glaucoma; C/D cup-to-disc ratio; HVF humphrey visual field; GVF goldmann visual field; OCT optical coherence tomography; IOP intraocular pressure; BRVO Branch retinal vein occlusion; CRVO central retinal vein occlusion; CRAO central retinal artery occlusion; BRAO branch retinal artery occlusion; RT retinal tear; SB scleral buckle; PPV pars plana vitrectomy; VH Vitreous hemorrhage; PRP panretinal laser photocoagulation; IVK intravitreal kenalog; VMT vitreomacular traction; MH Macular hole;  NVD neovascularization of the disc; NVE neovascularization  elsewhere; AREDS age related eye disease study; ARMD age related macular degeneration; POAG primary open angle glaucoma; EBMD epithelial/anterior basement membrane dystrophy; ACIOL anterior chamber intraocular lens; IOL intraocular lens; PCIOL posterior chamber intraocular lens; Phaco/IOL phacoemulsification with intraocular lens placement; [place] photorefractive keratectomy; LASIK laser assisted in situ keratomileusis; HTN hypertension; DM diabetes mellitus; COPD chronic obstructive pulmonary disease

## 2021-08-19 DIAGNOSIS — Z23 Encounter for immunization: Secondary | ICD-10-CM | POA: Diagnosis not present

## 2021-08-19 DIAGNOSIS — N186 End stage renal disease: Secondary | ICD-10-CM | POA: Diagnosis not present

## 2021-08-19 DIAGNOSIS — N2581 Secondary hyperparathyroidism of renal origin: Secondary | ICD-10-CM | POA: Diagnosis not present

## 2021-08-19 DIAGNOSIS — Z992 Dependence on renal dialysis: Secondary | ICD-10-CM | POA: Diagnosis not present

## 2021-08-21 ENCOUNTER — Telehealth: Payer: Medicare Other

## 2021-08-21 ENCOUNTER — Ambulatory Visit (INDEPENDENT_AMBULATORY_CARE_PROVIDER_SITE_OTHER): Payer: Medicare Other

## 2021-08-21 ENCOUNTER — Other Ambulatory Visit (HOSPITAL_COMMUNITY): Payer: Self-pay

## 2021-08-21 DIAGNOSIS — Z992 Dependence on renal dialysis: Secondary | ICD-10-CM | POA: Diagnosis not present

## 2021-08-21 DIAGNOSIS — F01518 Vascular dementia, unspecified severity, with other behavioral disturbance: Secondary | ICD-10-CM

## 2021-08-21 DIAGNOSIS — N186 End stage renal disease: Secondary | ICD-10-CM | POA: Diagnosis not present

## 2021-08-21 DIAGNOSIS — N2581 Secondary hyperparathyroidism of renal origin: Secondary | ICD-10-CM | POA: Diagnosis not present

## 2021-08-21 DIAGNOSIS — C9000 Multiple myeloma not having achieved remission: Secondary | ICD-10-CM

## 2021-08-21 DIAGNOSIS — Z23 Encounter for immunization: Secondary | ICD-10-CM | POA: Diagnosis not present

## 2021-08-21 MED ORDER — INSULIN GLARGINE-YFGN 100 UNIT/ML ~~LOC~~ SOPN
PEN_INJECTOR | SUBCUTANEOUS | 0 refills | Status: DC
  Filled 2021-08-21: qty 3, 28d supply, fill #0

## 2021-08-22 ENCOUNTER — Other Ambulatory Visit (HOSPITAL_COMMUNITY): Payer: Self-pay

## 2021-08-22 NOTE — Chronic Care Management (AMB) (Signed)
Chronic Care Management   CCM RN Visit Note  08/21/2021 Name: Isaiah [name] [facility] MRN: [id number] DOB: [birth year]  Subjective: Isaiah [name] [facility] is a 55 y.o. year old male who is a primary care patient of [name], MD. The care management team was consulted for assistance with disease management and care coordination needs.    Engaged with patient by telephone for follow up visit in response to provider referral for case management and/or care coordination services.   Consent to Services:  The patient was given information about Chronic Care Management services, agreed to services, and gave verbal consent prior to initiation of services.  Please see initial visit note for detailed documentation.   Patient agreed to services and verbal consent obtained.   Assessment: Review of patient past medical history, allergies, medications, health status, including review of consultants reports, laboratory and other test data, was performed as part of comprehensive evaluation and provision of chronic care management services.   SDOH (Social Determinants of Health) assessments and interventions performed:    CCM Care Plan  Allergies  Allergen Reactions   Codeine Swelling   Codeine Rash and Other (See Comments)    Unknown reaction (patient says it was more serious than just a rash, but he can't remember what happened)     Outpatient Encounter Medications as of 08/21/2021  Medication Sig   acetaminophen (TYLENOL) 325 MG tablet Take 1-2 tablets (325-650 mg total) by mouth every 4 (four) hours as needed for mild pain.   albuterol (PROAIR HFA) 108 (90 Base) MCG/ACT inhaler Inhale two puffs every 4-6 hours if needed for cough or wheeze   Alcohol Swabs (SM ALCOHOL PREP) 70 % PADS    atorvastatin (LIPITOR) 20 MG tablet TAKE 1 TABLET BY MOUTH ONCE A DAY   AURYXIA 1 GM 210 MG(Fe) tablet Take 210 mg by mouth every evening.   brimonidine (ALPHAGAN) 0.2 % ophthalmic solution    calcitRIOL (ROCALTROL) 0.25  MCG capsule Take 0.25 mcg by mouth daily.   Cetirizine HCl 10 MG CAPS Take 1 capsule by mouth daily.    Cholecalciferol (D3-1000 PO) Take 1 tablet by mouth See admin instructions. Take one tablet by mouth on Monday,Wednesday and Fridays   clonazePAM (KLONOPIN) 0.5 MG tablet Take 2 tablets in morning on Monday, Wednesday, Friday before dialysis   escitalopram (LEXAPRO) 10 MG tablet Take 1 tablet (10 mg total) by mouth daily.   ferric citrate (AURYXIA) 1 GM 210 MG(Fe) tablet Take 1 tablet by mouth 3 times a day with meals as directed   fluticasone (FLONASE) 50 MCG/ACT nasal spray Place 1 spray into both nostrils as needed for allergies.   fluticasone furoate-vilanterol (BREO ELLIPTA) 200-25 MCG/ACT AEPB Inhale 1 puff by mouth into the lungs at the same time everyday   fluticasone furoate-vilanterol (BREO ELLIPTA) 200-25 MCG/INH AEPB INHALE 1 PUFF BY MOUTH INTO THE LUNGS AT THE SAME TIME EVERY DAY   FREESTYLE LITE test strip    Glucagon, rDNA, (GLUCAGON EMERGENCY) 1 MG KIT See admin instructions.   glucose blood (FREESTYLE LITE) test strip Use as directed to test blood glucose levels 3 times daily.   hydrOXYzine (ATARAX/VISTARIL) 25 MG tablet Take by mouth as needed.   insulin glargine-yfgn (SEMGLEE, YFGN,) 100 UNIT/ML Pen Inject 6 units under the skin once daily (discard pen after 28 days from first use)   Insulin Glargine-yfgn (SEMGLEE, YFGN,) 100 UNIT/ML SOPN Inject 6 units subcutaneously once a day   Insulin Lispro (HUMALOG KWIKPEN [place]) Inject 2-4 Units  into the skin 3 (three) times daily before meals. Sliding scale: 150-250 =2 units, 250 -350=3 units, 350 += 4 units   Insulin Pen Needle 32G X 4 MM MISC USE AS DIRECTED 4 TO 5 TIMES DAILY TO INJECT INSULIN   lacosamide (VIMPAT) 200 MG TABS tablet TAKE 1 TABLET TWICE A DAY EXCEPT ON DIALYSIS DAYS M-W-F GIVE 1 TAB IN AM, 1 TAB AFTER DIALYSIS, 1 TAB IN PM.   lamoTRIgine (LAMICTAL) 200 MG tablet TAKE 1 TABLET BY MOUTH 2 TIMES DAILY   Lancets  (FREESTYLE) lancets    Lancets (FREESTYLE) lancets Use as directed to test blood glucose levels three times daily.   Methoxy PEG-Epoetin Beta (MIRCERA IJ)    metoprolol succinate (TOPROL-XL) 25 MG 24 hr tablet Take 25 mg by mouth 2 (two) times daily.   midodrine (PROAMATINE) 10 MG tablet Take 1 tablet (10 mg total) by mouth 2 (two) times daily.   multivitamin (RENA-VIT) TABS tablet Take 1 tablet by mouth at bedtime.   pantoprazole (PROTONIX) 40 MG tablet TAKE 1 TABLET BY MOUTH ONCE DAILY   prochlorperazine (COMPAZINE) 10 MG tablet Take 10 mg by mouth every 6 (six) hours as needed for nausea.   prochlorperazine (COMPAZINE) 10 MG tablet TAKE 1 TABLET BY MOUTH EVERY 6 HOURS AS NEEDED FOR NAUSEA OR VOMITING.   QUEtiapine (SEROQUEL) 25 MG tablet Take 25 mg by mouth as needed (relalaxion).   UNIFINE PENTIPS 31G X 8 MM MISC USE AS DIRECTED DAILY AFTER SUPPER   No facility-administered encounter medications on file as of 08/21/2021.    Patient Active Problem List   Diagnosis Date Noted   Chest pain 08/06/2021   Olfactory hallucinations 01/26/2021   Dependence on renal dialysis ([place]) 01/26/2021   Colitis 03/15/2020   AMS (altered mental status) 16/06/9603   Acute metabolic encephalopathy 54/05/8118   Headache, unspecified 09/28/2019   Abnormality of gait 09/13/2019   Diabetes mellitus with end-stage renal disease ([place]) 08/07/2019   Benign hypertensive kidney disease with chronic kidney disease 08/07/2019   Hypothyroidism, unspecified 07/15/2019   Spastic hemiparesis ([place]) 07/12/2019   Dysphagia, oropharyngeal phase 07/05/2019   Allergy, unspecified, initial encounter 06/27/2019   PAF (paroxysmal atrial fibrillation) (HCC)    Bacteremia    Labile blood pressure    Labile blood glucose    Controlled type 2 diabetes mellitus with hyperglycemia, without long-term current use of insulin ([place])    ESRD on dialysis ([place])    Right sided weakness    SDH (subdural hematoma)    Chronic intracranial  subdural hematoma ([place]) 06/09/2019   Multiple myeloma not having achieved remission ([place]) 06/09/2019   ESRD on hemodialysis ([place]) 06/09/2019   Essential hypertension 06/09/2019   DM type 2 (diabetes mellitus, type 2) ([place]) 06/09/2019   Seizure disorder ([place]) 06/09/2019   Aspiration pneumonia ([place]) 06/09/2019   History of bacteremia 06/09/2019   Atrial fibrillation, chronic ([place]) 06/09/2019   Bilateral kidney stones 05/31/2019   Proteus infection 05/28/2019   History of restrictive pulmonary disease 04/07/2019   Multiple myeloma ([place]) 05/17/2018   Goals of care, counseling/discussion 05/17/2018   Partial idiopathic epilepsy with seizures of localized onset, intractable, without status epilepticus ([place]) 11/02/2017   Iron deficiency anemia, unspecified 01/22/2017   COPD (chronic obstructive pulmonary disease) ([place]) 11/11/2016   CVA (cerebral vascular accident) ([place]) 11/11/2016   GERD (gastroesophageal reflux disease) 11/11/2016   GI bleed 11/11/2016   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   Obesity (BMI 30-39.9) 11/11/2016   Pulmonary hypertension ([place])  11/11/2016   Patent foramen ovale 11/11/2016   Hyperparathyroidism, secondary renal ([place]) 11/11/2016   VTE (venous thromboembolism) 11/11/2016   Hypotension 11/11/2016   Systemic hypertension 11/11/2016   Seizure ([place]) 11/11/2016   Other seizures ([place]) 07/25/2016   Encounter for removal of sutures 05/27/2016   Ingrown nail 03/03/2016   Onychomycosis due to dermatophyte 03/03/2016   Other acquired hammer toe 03/03/2016   Hemiparesis affecting right side as late effect of cerebrovascular accident ([place]) 39/11/90   Aphasia complicating stroke 33/00/7622   Seizures ([place]) 01/08/2016   History of ischemic left MCA stroke 01/08/2016   Right sided weakness 01/08/2016   Atrial fibrillation ([place]) 01/08/2016   Leukocytosis 01/08/2016   Benign essential HTN 01/08/2016   Anemia of chronic disease 01/08/2016   Controlled type 2  diabetes mellitus with diabetic nephropathy, without long-term current use of insulin ([place]) 01/08/2016   History of DVT (deep vein thrombosis) 01/08/2016   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   Hypocalcemia 12/26/2015   Long term (current) use of anticoagulants 11/07/2015   Aftercare including intermittent dialysis ([place]) 11/02/2015   Other specified coagulation defects ([place]) 63/33/5456   Complication of vascular dialysis catheter 11/02/2015   Diarrhea, unspecified 11/02/2015   Fever, unspecified 11/02/2015   Pruritus, unspecified 11/02/2015   Shortness of breath 11/02/2015   ESRD (end stage renal disease) on dialysis ([place])     Conditions to be addressed/monitored: DM with ESRD, Multiple Myeloma, Vascular dementia with behavior disturbances Care Plan : RN Care Manager Plan of Care  Updates made by Lynne Logan, RN since 08/21/2021 12:00 AM     Problem: No plan of care established for management of chronic disease states (DM with ESRD, Multiple Myeloma, Vascular dementia with behavior disturbances)   Priority: High     Long-Range Goal: Development of plan of care for chronic disease states (DM with ESRD, Multiple Myeloma, Vascular dementia with behavior disturbances)   Start Date: 08/21/2021  Expected End Date: 08/21/2022  This Visit's Progress: On track  Priority: High  Note:   Current Barriers:  Knowledge Deficits related to plan of care for management of DM with ESRD, Multiple Myeloma, Vascular dementia with behavior disturbances  Chronic Disease Management support and education needs related to DM with ESRD, Multiple Myeloma, Vascular dementia with behavior disturbances  Cognitive deficits with behavior changes   RNCM Clinical Goal(s):  Patient will continue to work with RN Care Manager to address care management and care coordination needs related to  DM with ESRD, Multiple Myeoloma as evidenced by adherence to CM Team Scheduled appointments through collaboration with RN  Care manager, provider, and care team.   Interventions: 1:1 collaboration with primary care provider regarding development and update of comprehensive plan of care as evidenced by provider attestation and co-signature Inter-disciplinary care team collaboration (see longitudinal plan of care) Evaluation of current treatment plan related to  self management and patient's adherence to plan as established by provider Completed RN CM follow up with wife and caregiver [name]    Oncology:  (Status: Goal on track:  Yes.) Long Term Goal Assessment of understanding of oncology diagnosis:  Assessed patient understanding of cancer diagnosis and recommended treatment plan, Reviewed upcoming provider appointments and treatment appointments, Assessed available transportation to appointments and treatments. Has consistent/reliable transportation: Yes, Assessed support system. Has consistent/reliable family or other support: Yes, and Nutrition assessment performed Discussed plans with patient for ongoing care management follow up and provided patient with direct contact information for care management team  Vascular dementia with behavior  disturbances:  (Status:  New goal.)  Long Term Goal Evaluation of current treatment plan related to Dementia, self-management and patient's adherence to plan as established by provider Provided education to patient about basic disease process related to Vascular Dementia Review of patient status, including review of consultant's reports, relevant laboratory and other test results, and medications completed. Reviewed medications with patient and discussed importance of medication adherence Reviewed scheduled/upcoming provider appointments including next Neuro follow up with Dr. Delice Lesch scheduled for 10/08/21 _0 :28 AM Educated wife regarding Palliative Care and encouraged her to discuss this referral with Neurology at next follow up appointment Mailed printed educational materials  related to Vascular Dementia; Palliative Care Discussed plans with patient for ongoing care management follow up and provided patient with direct contact information for care management team  Patient Goals/Self-Care Activities: Take all medications as prescribed Attend all scheduled provider appointments Call pharmacy for medication refills 3-7 days in advance of running out of medications Call provider office for new concerns or questions   Follow Up Plan:  Telephone follow up appointment with care management team member scheduled for:  10/11/21      Plan:Telephone follow up appointment with care management team member scheduled for:  10/11/21  Barb Merino, RN, BSN, CCM Care Management Coordinator [place] [facility]  Direct Phone: [phone]

## 2021-08-22 NOTE — Patient Instructions (Signed)
Visit Information   Thank you for taking time to visit with me today. Please don't hesitate to contact me if I can be of assistance to you before our next scheduled telephone appointment.  Following are the goals we discussed today:  (Copy and paste patient goals from clinical care plan here)  Our next appointment is by telephone on 10/11/21 at 11:50 AM  Please call the care guide team at [phone] if you need to cancel or reschedule your appointment.   If you are experiencing a Mental Health or [place] or need someone to talk to, please call 1-800-273-TALK (toll free, 24 hour hotline)   Following is a copy of your full care plan:   Care Plan : [place] of Care  Updates made by Lynne Logan, RN since 08/21/2021 12:00 AM     Problem: No plan of care established for management of chronic disease states (DM with ESRD, Multiple Myeloma, Vascular dementia with behavior disturbances)   Priority: High     Long-Range Goal: Development of plan of care for chronic disease states (DM with ESRD, Multiple Myeloma, Vascular dementia with behavior disturbances)   Start Date: 08/21/2021  Expected End Date: 08/21/2022  This Visit's Progress: On track  Priority: High  Note:   Current Barriers:  Knowledge Deficits related to plan of care for management of DM with ESRD, Multiple Myeloma, Vascular dementia with behavior disturbances  Chronic Disease Management support and education needs related to DM with ESRD, Multiple Myeloma, Vascular dementia with behavior disturbances  Cognitive deficits with behavior changes   RNCM Clinical Goal(s):  Patient will continue to work with RN Care Manager to address care management and care coordination needs related to  DM with ESRD, Multiple Myeoloma as evidenced by adherence to CM Team Scheduled appointments through collaboration with RN Care manager, provider, and care team.   Interventions: 1:1 collaboration with primary care  provider regarding development and update of comprehensive plan of care as evidenced by provider attestation and co-signature Inter-disciplinary care team collaboration (see longitudinal plan of care) Evaluation of current treatment plan related to  self management and patient's adherence to plan as established by provider Completed RN CM follow up with wife and caregiver [name]    Oncology:  (Status: Goal on track:  Yes.) Long Term Goal Assessment of understanding of oncology diagnosis:  Assessed patient understanding of cancer diagnosis and recommended treatment plan, Reviewed upcoming provider appointments and treatment appointments, Assessed available transportation to appointments and treatments. Has consistent/reliable transportation: Yes, Assessed support system. Has consistent/reliable family or other support: Yes, and Nutrition assessment performed Discussed plans with patient for ongoing care management follow up and provided patient with direct contact information for care management team  Vascular dementia with behavior disturbances:  (Status:  New goal.)  Long Term Goal Evaluation of current treatment plan related to Dementia, self-management and patient's adherence to plan as established by provider Provided education to patient about basic disease process related to Vascular Dementia Review of patient status, including review of consultant's reports, relevant laboratory and other test results, and medications completed. Reviewed medications with patient and discussed importance of medication adherence Reviewed scheduled/upcoming provider appointments including next Neuro follow up with Dr. Delice Lesch scheduled for 10/08/21 @11 :59 AM Educated wife regarding Palliative Care and encouraged her to discuss this referral with Neurology at next follow up appointment Mailed printed educational materials related to Vascular Dementia; Palliative Care Discussed plans with patient for ongoing care  management follow up and  provided patient with direct contact information for care management team  Patient Goals/Self-Care Activities: Take all medications as prescribed Attend all scheduled provider appointments Call pharmacy for medication refills 3-7 days in advance of running out of medications Call provider office for new concerns or questions   Follow Up Plan:  Telephone follow up appointment with care management team member scheduled for:  10/11/21      Consent to CCM Services: Mr. Gratz was given information about Chronic Care Management services including:  CCM service includes personalized support from designated clinical staff supervised by his physician, including individualized plan of care and coordination with other care providers 24/7 contact phone numbers for assistance for urgent and routine care needs. Service will only be billed when office clinical staff spend 20 minutes or more in a month to coordinate care. Only one practitioner may furnish and bill the service in a calendar month. The patient may stop CCM services at any time (effective at the end of the month) by phone call to the office staff. The patient will be responsible for cost sharing (co-pay) of up to 20% of the service fee (after annual deductible is met).  Patient agreed to services and verbal consent obtained.   Patient verbalizes understanding of instructions provided today and agrees to view in [place].   Telephone follow up appointment with care management team member scheduled for: 10/11/21

## 2021-08-23 DIAGNOSIS — Z23 Encounter for immunization: Secondary | ICD-10-CM | POA: Diagnosis not present

## 2021-08-23 DIAGNOSIS — N2581 Secondary hyperparathyroidism of renal origin: Secondary | ICD-10-CM | POA: Diagnosis not present

## 2021-08-23 DIAGNOSIS — Z992 Dependence on renal dialysis: Secondary | ICD-10-CM | POA: Diagnosis not present

## 2021-08-23 DIAGNOSIS — N186 End stage renal disease: Secondary | ICD-10-CM | POA: Diagnosis not present

## 2021-08-26 ENCOUNTER — Other Ambulatory Visit (HOSPITAL_COMMUNITY): Payer: Self-pay

## 2021-08-26 DIAGNOSIS — Z992 Dependence on renal dialysis: Secondary | ICD-10-CM | POA: Diagnosis not present

## 2021-08-26 DIAGNOSIS — N186 End stage renal disease: Secondary | ICD-10-CM | POA: Diagnosis not present

## 2021-08-26 DIAGNOSIS — Z23 Encounter for immunization: Secondary | ICD-10-CM | POA: Diagnosis not present

## 2021-08-26 DIAGNOSIS — N2581 Secondary hyperparathyroidism of renal origin: Secondary | ICD-10-CM | POA: Diagnosis not present

## 2021-08-27 ENCOUNTER — Encounter (INDEPENDENT_AMBULATORY_CARE_PROVIDER_SITE_OTHER): Payer: Medicare Other | Admitting: Ophthalmology

## 2021-08-27 ENCOUNTER — Ambulatory Visit (INDEPENDENT_AMBULATORY_CARE_PROVIDER_SITE_OTHER): Payer: Medicare Other | Admitting: Ophthalmology

## 2021-08-27 ENCOUNTER — Other Ambulatory Visit (HOSPITAL_COMMUNITY): Payer: Self-pay

## 2021-08-27 ENCOUNTER — Other Ambulatory Visit: Payer: Self-pay

## 2021-08-27 ENCOUNTER — Encounter (INDEPENDENT_AMBULATORY_CARE_PROVIDER_SITE_OTHER): Payer: Self-pay | Admitting: Ophthalmology

## 2021-08-27 DIAGNOSIS — E113593 Type 2 diabetes mellitus with proliferative diabetic retinopathy without macular edema, bilateral: Secondary | ICD-10-CM

## 2021-08-27 DIAGNOSIS — H04123 Dry eye syndrome of bilateral lacrimal glands: Secondary | ICD-10-CM

## 2021-08-27 DIAGNOSIS — H36 Retinal disorders in diseases classified elsewhere: Secondary | ICD-10-CM

## 2021-08-27 DIAGNOSIS — H3582 Retinal ischemia: Secondary | ICD-10-CM | POA: Diagnosis not present

## 2021-08-27 DIAGNOSIS — D571 Sickle-cell disease without crisis: Secondary | ICD-10-CM

## 2021-08-27 DIAGNOSIS — H35033 Hypertensive retinopathy, bilateral: Secondary | ICD-10-CM

## 2021-08-27 DIAGNOSIS — Z961 Presence of intraocular lens: Secondary | ICD-10-CM

## 2021-08-27 DIAGNOSIS — I10 Essential (primary) hypertension: Secondary | ICD-10-CM

## 2021-08-27 DIAGNOSIS — H3581 Retinal edema: Secondary | ICD-10-CM

## 2021-08-27 MED ORDER — BEVACIZUMAB CHEMO INJECTION 1.25MG/0.05ML SYRINGE FOR KALEIDOSCOPE
1.2500 mg | INTRAVITREAL | Status: AC | PRN
Start: 2021-08-27 — End: 2021-08-27
  Administered 2021-08-27: 16:00:00 1.25 mg via INTRAVITREAL

## 2021-08-28 DIAGNOSIS — N2581 Secondary hyperparathyroidism of renal origin: Secondary | ICD-10-CM | POA: Diagnosis not present

## 2021-08-28 DIAGNOSIS — Z23 Encounter for immunization: Secondary | ICD-10-CM | POA: Diagnosis not present

## 2021-08-28 DIAGNOSIS — Z992 Dependence on renal dialysis: Secondary | ICD-10-CM | POA: Diagnosis not present

## 2021-08-28 DIAGNOSIS — N186 End stage renal disease: Secondary | ICD-10-CM | POA: Diagnosis not present

## 2021-08-29 ENCOUNTER — Encounter: Payer: Self-pay | Admitting: Oncology

## 2021-08-29 ENCOUNTER — Other Ambulatory Visit (HOSPITAL_COMMUNITY): Payer: Self-pay

## 2021-08-29 DIAGNOSIS — N186 End stage renal disease: Secondary | ICD-10-CM | POA: Diagnosis not present

## 2021-08-29 DIAGNOSIS — I10 Essential (primary) hypertension: Secondary | ICD-10-CM | POA: Diagnosis not present

## 2021-08-29 DIAGNOSIS — C9000 Multiple myeloma not having achieved remission: Secondary | ICD-10-CM | POA: Diagnosis not present

## 2021-08-29 DIAGNOSIS — D649 Anemia, unspecified: Secondary | ICD-10-CM | POA: Diagnosis not present

## 2021-08-29 DIAGNOSIS — E109 Type 1 diabetes mellitus without complications: Secondary | ICD-10-CM | POA: Diagnosis not present

## 2021-08-30 DIAGNOSIS — Z992 Dependence on renal dialysis: Secondary | ICD-10-CM | POA: Diagnosis not present

## 2021-08-30 DIAGNOSIS — N186 End stage renal disease: Secondary | ICD-10-CM | POA: Diagnosis not present

## 2021-08-30 DIAGNOSIS — N2581 Secondary hyperparathyroidism of renal origin: Secondary | ICD-10-CM | POA: Diagnosis not present

## 2021-08-30 DIAGNOSIS — Z23 Encounter for immunization: Secondary | ICD-10-CM | POA: Diagnosis not present

## 2021-09-02 DIAGNOSIS — N2581 Secondary hyperparathyroidism of renal origin: Secondary | ICD-10-CM | POA: Diagnosis not present

## 2021-09-02 DIAGNOSIS — N186 End stage renal disease: Secondary | ICD-10-CM | POA: Diagnosis not present

## 2021-09-02 DIAGNOSIS — Z23 Encounter for immunization: Secondary | ICD-10-CM | POA: Diagnosis not present

## 2021-09-02 DIAGNOSIS — Z992 Dependence on renal dialysis: Secondary | ICD-10-CM | POA: Diagnosis not present

## 2021-09-03 ENCOUNTER — Ambulatory Visit (HOSPITAL_COMMUNITY): Payer: Medicare Other | Attending: Cardiovascular Disease

## 2021-09-03 ENCOUNTER — Other Ambulatory Visit: Payer: Self-pay

## 2021-09-03 DIAGNOSIS — R079 Chest pain, unspecified: Secondary | ICD-10-CM | POA: Insufficient documentation

## 2021-09-03 DIAGNOSIS — I480 Paroxysmal atrial fibrillation: Secondary | ICD-10-CM | POA: Diagnosis not present

## 2021-09-03 DIAGNOSIS — Q2112 Patent foramen ovale: Secondary | ICD-10-CM | POA: Insufficient documentation

## 2021-09-03 LAB — ECHOCARDIOGRAM COMPLETE
Area-P 1/2: 3.19 cm2
S' Lateral: 3.1 cm

## 2021-09-04 ENCOUNTER — Encounter: Payer: Self-pay | Admitting: Physician Assistant

## 2021-09-04 ENCOUNTER — Other Ambulatory Visit (HOSPITAL_COMMUNITY): Payer: Self-pay

## 2021-09-04 DIAGNOSIS — Z23 Encounter for immunization: Secondary | ICD-10-CM | POA: Diagnosis not present

## 2021-09-04 DIAGNOSIS — N2581 Secondary hyperparathyroidism of renal origin: Secondary | ICD-10-CM | POA: Diagnosis not present

## 2021-09-04 DIAGNOSIS — N186 End stage renal disease: Secondary | ICD-10-CM | POA: Diagnosis not present

## 2021-09-04 DIAGNOSIS — Z992 Dependence on renal dialysis: Secondary | ICD-10-CM | POA: Diagnosis not present

## 2021-09-06 DIAGNOSIS — Z23 Encounter for immunization: Secondary | ICD-10-CM | POA: Diagnosis not present

## 2021-09-06 DIAGNOSIS — N186 End stage renal disease: Secondary | ICD-10-CM | POA: Diagnosis not present

## 2021-09-06 DIAGNOSIS — Z992 Dependence on renal dialysis: Secondary | ICD-10-CM | POA: Diagnosis not present

## 2021-09-06 DIAGNOSIS — N2581 Secondary hyperparathyroidism of renal origin: Secondary | ICD-10-CM | POA: Diagnosis not present

## 2021-09-07 DIAGNOSIS — N186 End stage renal disease: Secondary | ICD-10-CM

## 2021-09-07 DIAGNOSIS — F01518 Vascular dementia, unspecified severity, with other behavioral disturbance: Secondary | ICD-10-CM

## 2021-09-07 DIAGNOSIS — E1122 Type 2 diabetes mellitus with diabetic chronic kidney disease: Secondary | ICD-10-CM | POA: Diagnosis not present

## 2021-09-08 DIAGNOSIS — Z992 Dependence on renal dialysis: Secondary | ICD-10-CM | POA: Diagnosis not present

## 2021-09-08 DIAGNOSIS — E1122 Type 2 diabetes mellitus with diabetic chronic kidney disease: Secondary | ICD-10-CM | POA: Diagnosis not present

## 2021-09-08 DIAGNOSIS — N186 End stage renal disease: Secondary | ICD-10-CM | POA: Diagnosis not present

## 2021-09-09 DIAGNOSIS — Z992 Dependence on renal dialysis: Secondary | ICD-10-CM | POA: Diagnosis not present

## 2021-09-09 DIAGNOSIS — N186 End stage renal disease: Secondary | ICD-10-CM | POA: Diagnosis not present

## 2021-09-09 DIAGNOSIS — N2581 Secondary hyperparathyroidism of renal origin: Secondary | ICD-10-CM | POA: Diagnosis not present

## 2021-09-11 ENCOUNTER — Telehealth: Payer: Self-pay | Admitting: Physician Assistant

## 2021-09-11 DIAGNOSIS — N186 End stage renal disease: Secondary | ICD-10-CM | POA: Diagnosis not present

## 2021-09-11 DIAGNOSIS — Z992 Dependence on renal dialysis: Secondary | ICD-10-CM | POA: Diagnosis not present

## 2021-09-11 DIAGNOSIS — N2581 Secondary hyperparathyroidism of renal origin: Secondary | ICD-10-CM | POA: Diagnosis not present

## 2021-09-11 NOTE — Telephone Encounter (Signed)
-----   Message from Liliane Shi, [place] sent at 09/04/2021 11:12 AM EST ----- Echocardiogram shows normal EF.  No significant valve disease.  Left atrium is enlarged.  Pt has hx of OSA which is likely the cause.   PLAN:  - Ask pt if he has CPAP and if he is wearing it consistently - Continue current medications/treatment plan and follow up as scheduled.  - Send copy to PCP Richardson Dopp, PA-C    09/04/2021 11:00 AM

## 2021-09-11 NOTE — Telephone Encounter (Signed)
Returned pt's wife, [place], [place] on file, call.  They have been made aware of pt's echcardiogram results. See result note.

## 2021-09-11 NOTE — Telephone Encounter (Signed)
Patient's wife is returning Jennifer's call.

## 2021-09-13 DIAGNOSIS — Z992 Dependence on renal dialysis: Secondary | ICD-10-CM | POA: Diagnosis not present

## 2021-09-13 DIAGNOSIS — N186 End stage renal disease: Secondary | ICD-10-CM | POA: Diagnosis not present

## 2021-09-13 DIAGNOSIS — N2581 Secondary hyperparathyroidism of renal origin: Secondary | ICD-10-CM | POA: Diagnosis not present

## 2021-09-16 DIAGNOSIS — Z992 Dependence on renal dialysis: Secondary | ICD-10-CM | POA: Diagnosis not present

## 2021-09-16 DIAGNOSIS — N186 End stage renal disease: Secondary | ICD-10-CM | POA: Diagnosis not present

## 2021-09-16 DIAGNOSIS — N2581 Secondary hyperparathyroidism of renal origin: Secondary | ICD-10-CM | POA: Diagnosis not present

## 2021-09-18 DIAGNOSIS — N2581 Secondary hyperparathyroidism of renal origin: Secondary | ICD-10-CM | POA: Diagnosis not present

## 2021-09-18 DIAGNOSIS — E1129 Type 2 diabetes mellitus with other diabetic kidney complication: Secondary | ICD-10-CM | POA: Diagnosis not present

## 2021-09-18 DIAGNOSIS — N186 End stage renal disease: Secondary | ICD-10-CM | POA: Diagnosis not present

## 2021-09-18 DIAGNOSIS — Z992 Dependence on renal dialysis: Secondary | ICD-10-CM | POA: Diagnosis not present

## 2021-09-20 DIAGNOSIS — Z992 Dependence on renal dialysis: Secondary | ICD-10-CM | POA: Diagnosis not present

## 2021-09-20 DIAGNOSIS — N2581 Secondary hyperparathyroidism of renal origin: Secondary | ICD-10-CM | POA: Diagnosis not present

## 2021-09-20 DIAGNOSIS — N186 End stage renal disease: Secondary | ICD-10-CM | POA: Diagnosis not present

## 2021-09-23 ENCOUNTER — Other Ambulatory Visit (HOSPITAL_COMMUNITY): Payer: Self-pay

## 2021-09-23 DIAGNOSIS — N2581 Secondary hyperparathyroidism of renal origin: Secondary | ICD-10-CM | POA: Diagnosis not present

## 2021-09-23 DIAGNOSIS — Z992 Dependence on renal dialysis: Secondary | ICD-10-CM | POA: Diagnosis not present

## 2021-09-23 DIAGNOSIS — N186 End stage renal disease: Secondary | ICD-10-CM | POA: Diagnosis not present

## 2021-09-25 DIAGNOSIS — Z992 Dependence on renal dialysis: Secondary | ICD-10-CM | POA: Diagnosis not present

## 2021-09-25 DIAGNOSIS — N2581 Secondary hyperparathyroidism of renal origin: Secondary | ICD-10-CM | POA: Diagnosis not present

## 2021-09-25 DIAGNOSIS — N186 End stage renal disease: Secondary | ICD-10-CM | POA: Diagnosis not present

## 2021-09-26 ENCOUNTER — Telehealth: Payer: Self-pay | Admitting: *Deleted

## 2021-09-26 ENCOUNTER — Telehealth: Payer: Self-pay

## 2021-09-26 ENCOUNTER — Other Ambulatory Visit: Payer: Self-pay

## 2021-09-26 ENCOUNTER — Inpatient Hospital Stay: Payer: Medicare Other | Attending: Oncology

## 2021-09-26 DIAGNOSIS — C9000 Multiple myeloma not having achieved remission: Secondary | ICD-10-CM | POA: Diagnosis not present

## 2021-09-26 DIAGNOSIS — D630 Anemia in neoplastic disease: Secondary | ICD-10-CM | POA: Insufficient documentation

## 2021-09-26 DIAGNOSIS — D631 Anemia in chronic kidney disease: Secondary | ICD-10-CM | POA: Insufficient documentation

## 2021-09-26 DIAGNOSIS — N189 Chronic kidney disease, unspecified: Secondary | ICD-10-CM | POA: Insufficient documentation

## 2021-09-26 LAB — CMP (CANCER CENTER ONLY)
ALT: 41 U/L (ref 0–44)
AST: 44 U/L — ABNORMAL HIGH (ref 15–41)
Albumin: 4.7 g/dL (ref 3.5–5.0)
Alkaline Phosphatase: 89 U/L (ref 38–126)
Anion gap: 12 (ref 5–15)
BUN: 32 mg/dL — ABNORMAL HIGH (ref 6–20)
CO2: 33 mmol/L — ABNORMAL HIGH (ref 22–32)
Calcium: 9.3 mg/dL (ref 8.9–10.3)
Chloride: 96 mmol/L — ABNORMAL LOW (ref 98–111)
Creatinine: 7.16 mg/dL (ref 0.61–1.24)
GFR, Estimated: 8 mL/min — ABNORMAL LOW (ref >60)
Glucose, Bld: 120 mg/dL — ABNORMAL HIGH (ref 70–99)
Potassium: 4.3 mmol/L (ref 3.5–5.1)
Sodium: 141 mmol/L (ref 135–145)
Total Bilirubin: 0.8 mg/dL (ref 0.3–1.2)
Total Protein: 8 g/dL (ref 6.5–8.1)

## 2021-09-26 LAB — CBC WITH DIFFERENTIAL (CANCER CENTER ONLY)
Abs Immature Granulocytes: 0.02 10*3/uL (ref 0.00–0.07)
Basophils Absolute: 0.1 10*3/uL (ref 0.0–0.1)
Basophils Relative: 2 %
Eosinophils Absolute: 0.6 10*3/uL — ABNORMAL HIGH (ref 0.0–0.5)
Eosinophils Relative: 8 %
HCT: 26.9 % — ABNORMAL LOW (ref 39.0–52.0)
Hemoglobin: 10.1 g/dL — ABNORMAL LOW (ref 13.0–17.0)
Immature Granulocytes: 0 %
Lymphocytes Relative: 18 %
Lymphs Abs: 1.3 10*3/uL (ref 0.7–4.0)
MCH: 31.9 pg (ref 26.0–34.0)
MCHC: 37.5 g/dL — ABNORMAL HIGH (ref 30.0–36.0)
MCV: 84.9 fL (ref 80.0–100.0)
Monocytes Absolute: 0.9 10*3/uL (ref 0.1–1.0)
Monocytes Relative: 13 %
Neutro Abs: 4.1 10*3/uL (ref 1.7–7.7)
Neutrophils Relative %: 59 %
Platelet Count: 251 10*3/uL (ref 150–400)
RBC: 3.17 MIL/uL — ABNORMAL LOW (ref 4.22–5.81)
RDW: 14.8 % (ref 11.5–15.5)
WBC Count: 7.1 10*3/uL (ref 4.0–10.5)
nRBC: 0.6 % — ABNORMAL HIGH (ref 0.0–0.2)

## 2021-09-26 NOTE — Telephone Encounter (Signed)
CRITICAL VALUE STICKER  CRITICAL VALUE: Creatnine 7.16  RECEIVER (on-site recipient of call): H. Breeding LPN  DATE & TIME NOTIFIED: 09/26/21 @ [id number]  MESSENGER (representative from lab): Rosann Auerbach  MD NOTIFIED: Dr Alen Blew  TIME OF NOTIFICATION: 8502  RESPONSE:  MD aware

## 2021-09-26 NOTE — Telephone Encounter (Signed)
CRITICAL VALUE STICKER  CRITICAL VALUE:  creatinine 7.16  RECEIVER (on-site recipient of call):  [place] NOTIFIED: 09:48  MESSENGER (representative from lab):  Rosann Auerbach  MD NOTIFIED: Alen Blew  TIME OF NOTIFICATION:09:51  RESPONSE:

## 2021-09-27 DIAGNOSIS — Z992 Dependence on renal dialysis: Secondary | ICD-10-CM | POA: Diagnosis not present

## 2021-09-27 DIAGNOSIS — N186 End stage renal disease: Secondary | ICD-10-CM | POA: Diagnosis not present

## 2021-09-27 DIAGNOSIS — N2581 Secondary hyperparathyroidism of renal origin: Secondary | ICD-10-CM | POA: Diagnosis not present

## 2021-09-27 LAB — KAPPA/LAMBDA LIGHT CHAINS
Kappa free light chain: 255 mg/L — ABNORMAL HIGH (ref 3.3–19.4)
Kappa, lambda light chain ratio: 2.78 — ABNORMAL HIGH (ref 0.26–1.65)
Lambda free light chains: 91.7 mg/L — ABNORMAL HIGH (ref 5.7–26.3)

## 2021-09-30 ENCOUNTER — Other Ambulatory Visit (HOSPITAL_COMMUNITY): Payer: Self-pay

## 2021-09-30 DIAGNOSIS — Z992 Dependence on renal dialysis: Secondary | ICD-10-CM | POA: Diagnosis not present

## 2021-09-30 DIAGNOSIS — N2581 Secondary hyperparathyroidism of renal origin: Secondary | ICD-10-CM | POA: Diagnosis not present

## 2021-09-30 DIAGNOSIS — N186 End stage renal disease: Secondary | ICD-10-CM | POA: Diagnosis not present

## 2021-10-01 LAB — MULTIPLE MYELOMA PANEL, SERUM
Albumin SerPl Elph-Mcnc: 3.9 g/dL (ref 2.9–4.4)
Albumin/Glob SerPl: 1.2 (ref 0.7–1.7)
Alpha 1: 0.4 g/dL (ref 0.0–0.4)
Alpha2 Glob SerPl Elph-Mcnc: 0.9 g/dL (ref 0.4–1.0)
B-Globulin SerPl Elph-Mcnc: 0.9 g/dL (ref 0.7–1.3)
Gamma Glob SerPl Elph-Mcnc: 1.2 g/dL (ref 0.4–1.8)
Globulin, Total: 3.4 g/dL (ref 2.2–3.9)
IgA: 85 mg/dL — ABNORMAL LOW (ref 90–386)
IgG (Immunoglobin G), Serum: 1133 mg/dL (ref 603–1613)
IgM (Immunoglobulin M), Srm: 32 mg/dL (ref 20–172)
Total Protein ELP: 7.3 g/dL (ref 6.0–8.5)

## 2021-10-02 ENCOUNTER — Other Ambulatory Visit (HOSPITAL_COMMUNITY): Payer: Self-pay

## 2021-10-02 ENCOUNTER — Other Ambulatory Visit: Payer: Self-pay

## 2021-10-02 DIAGNOSIS — N2581 Secondary hyperparathyroidism of renal origin: Secondary | ICD-10-CM | POA: Diagnosis not present

## 2021-10-02 DIAGNOSIS — Z992 Dependence on renal dialysis: Secondary | ICD-10-CM | POA: Diagnosis not present

## 2021-10-02 DIAGNOSIS — N186 End stage renal disease: Secondary | ICD-10-CM | POA: Diagnosis not present

## 2021-10-02 MED ORDER — HYDROXYZINE HCL 25 MG PO TABS
25.0000 mg | ORAL_TABLET | ORAL | 1 refills | Status: AC | PRN
  Filled 2021-10-02: qty 30, 30d supply, fill #0
  Filled 2022-04-20: qty 30, 30d supply, fill #1

## 2021-10-03 ENCOUNTER — Other Ambulatory Visit: Payer: Self-pay

## 2021-10-03 ENCOUNTER — Inpatient Hospital Stay (HOSPITAL_BASED_OUTPATIENT_CLINIC_OR_DEPARTMENT_OTHER): Payer: Medicare Other | Admitting: Oncology

## 2021-10-03 VITALS — BP 112/53 | HR 81 | Temp 97.5°F | Resp 20 | Wt 187.7 lb

## 2021-10-03 DIAGNOSIS — D630 Anemia in neoplastic disease: Secondary | ICD-10-CM | POA: Diagnosis not present

## 2021-10-03 DIAGNOSIS — C9000 Multiple myeloma not having achieved remission: Secondary | ICD-10-CM

## 2021-10-03 DIAGNOSIS — D631 Anemia in chronic kidney disease: Secondary | ICD-10-CM | POA: Diagnosis not present

## 2021-10-03 DIAGNOSIS — N189 Chronic kidney disease, unspecified: Secondary | ICD-10-CM | POA: Diagnosis not present

## 2021-10-03 NOTE — Progress Notes (Signed)
Hematology and Oncology Follow Up Visit  [place] [id number] 1965-11-04 56 y.o. 10/03/2021 9:28 AM    Principle Diagnosis: 56 year old man with multiple myeloma diagnosed in 2019.  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   Past therapy: Velcade dexamethasone and cyclophosphamide weekly started on 05/25/2018.  Velcade is given at 1.5 mg/m weekly, dexamethasone is 20 mg weekly and and Cytoxan is given at 650 mg total dose.  Last treatment was given in August 2020.    Therapy was held today in anticipation of high-dose chemotherapy and autologous stem cell transplant.  He did not receive stem cell transplant due to subdural hematoma and sepsis in September 2020.  Velcade at 1.5 mg per metered squared subcutaneously weekly, dexamethasone 20 mg weekly, daratumumab 1800 mg subcutaneous injection.   Cycle 1 started on 08/30/2019.  Therapy concluded in June 2021.   Daratumumab monthly maintenance started in June 2021.  Therapy discontinued in December 2021.  Current therapy: Active surveillance.  Interim History:  [name] returns today for a follow-up visit.  Since the last visit, he reports no major changes in his health.  Continues to tolerate dialysis without any recent issues.  He denies any nausea, vomiting or abdominal pain.  He denies any bone pain or pathological fractures.  Formal status quality of life remains unchanged.                     Medications: Updated on review Current Outpatient Medications:    acetaminophen (TYLENOL) 325 MG tablet, Take 1-2 tablets (325-650 mg total) by mouth every 4 (four) hours as needed for mild pain., Disp:  , Rfl:    albuterol (PROAIR HFA) 108 (90 Base) MCG/ACT inhaler, Inhale two puffs every 4-6 hours if needed for cough or wheeze, Disp: 6.7 g, Rfl: 2   Alcohol Swabs (SM ALCOHOL PREP) 70 % PADS, , Disp: , Rfl:    atorvastatin (LIPITOR) 20 MG tablet, TAKE 1 TABLET BY MOUTH ONCE A DAY, Disp: 90 tablet, Rfl: 2    AURYXIA 1 GM 210 MG(Fe) tablet, Take 210 mg by mouth every evening., Disp: , Rfl:    brimonidine (ALPHAGAN) 0.2 % ophthalmic solution, , Disp: , Rfl:    calcitRIOL (ROCALTROL) 0.25 MCG capsule, Take 0.25 mcg by mouth daily., Disp: , Rfl:    Cetirizine HCl 10 MG CAPS, Take 1 capsule by mouth daily. , Disp: , Rfl:    Cholecalciferol (D3-1000 PO), Take 1 tablet by mouth See admin instructions. Take one tablet by mouth on Monday,Wednesday and Fridays, Disp: , Rfl:    clonazePAM (KLONOPIN) 0.5 MG tablet, Take 2 tablets in morning on Monday, Wednesday, Friday before dialysis, Disp: 60 tablet, Rfl: 5   escitalopram (LEXAPRO) 10 MG tablet, Take 1 tablet (10 mg total) by mouth daily., Disp: 30 tablet, Rfl: 6   ferric citrate (AURYXIA) 1 GM 210 MG(Fe) tablet, Take 1 tablet by mouth 3 times a day with meals as directed, Disp: 270 tablet, Rfl: 3   fluticasone (FLONASE) 50 MCG/ACT nasal spray, Place 1 spray into both nostrils as needed for allergies., Disp: , Rfl:    fluticasone furoate-vilanterol (BREO ELLIPTA) 200-25 MCG/ACT AEPB, Inhale 1 puff by mouth into the lungs at the same time everyday, Disp: 60 each, Rfl: 1   fluticasone furoate-vilanterol (BREO ELLIPTA) 200-25 MCG/INH AEPB, INHALE 1 PUFF BY MOUTH INTO THE LUNGS AT THE SAME TIME EVERY DAY, Disp: 60 each, Rfl: 2   FREESTYLE LITE test strip, , Disp: ,  Rfl:    Glucagon, rDNA, (GLUCAGON EMERGENCY) 1 MG KIT, See admin instructions., Disp: , Rfl:    glucose blood (FREESTYLE LITE) test strip, Use as directed to test blood glucose levels 3 times daily., Disp: 300 each, Rfl: 3   hydrOXYzine (ATARAX) 25 MG tablet, Take 1 tablet (25 mg total) by mouth as needed., Disp: 30 tablet, Rfl: 1   insulin glargine-yfgn (SEMGLEE, YFGN,) 100 UNIT/ML Pen, Inject 6 units under the skin once daily (discard pen after 28 days from first use), Disp: 3 mL, Rfl: 0   Insulin Glargine-yfgn (SEMGLEE, YFGN,) 100 UNIT/ML SOPN, Inject 6 units subcutaneously once a day, Disp: 9 mL, Rfl:  4   Insulin Lispro (HUMALOG KWIKPEN ), Inject 2-4 Units into the skin 3 (three) times daily before meals. Sliding scale: 150-250 =2 units, 250 -350=3 units, 350 += 4 units, Disp: , Rfl:    Insulin Pen Needle 32G X 4 MM MISC, USE AS DIRECTED 4 TO 5 TIMES DAILY TO INJECT INSULIN, Disp: 400 each, Rfl: 6   lacosamide (VIMPAT) 200 MG TABS tablet, TAKE 1 TABLET TWICE A DAY EXCEPT ON DIALYSIS DAYS M-W-F GIVE 1 TAB IN AM, 1 TAB AFTER DIALYSIS, 1 TAB IN PM., Disp: 90 tablet, Rfl: 5   lamoTRIgine (LAMICTAL) 200 MG tablet, TAKE 1 TABLET BY MOUTH 2 TIMES DAILY, Disp: 180 tablet, Rfl: 3   Lancets (FREESTYLE) lancets, , Disp: , Rfl:    Lancets (FREESTYLE) lancets, Use as directed to test blood glucose levels three times daily., Disp: 300 each, Rfl: 4   Methoxy PEG-Epoetin Beta (MIRCERA IJ), , Disp: , Rfl:    metoprolol succinate (TOPROL-XL) 25 MG 24 hr tablet, Take 25 mg by mouth 2 (two) times daily., Disp: , Rfl:    midodrine (PROAMATINE) 10 MG tablet, Take 1 tablet (10 mg total) by mouth 2 (two) times daily., Disp: 180 tablet, Rfl: 1   multivitamin (RENA-VIT) TABS tablet, Take 1 tablet by mouth at bedtime., Disp: , Rfl:    pantoprazole (PROTONIX) 40 MG tablet, TAKE 1 TABLET BY MOUTH ONCE DAILY, Disp: 90 tablet, Rfl: 1   prochlorperazine (COMPAZINE) 10 MG tablet, Take 10 mg by mouth every 6 (six) hours as needed for nausea., Disp: , Rfl:    prochlorperazine (COMPAZINE) 10 MG tablet, TAKE 1 TABLET BY MOUTH EVERY 6 HOURS AS NEEDED FOR NAUSEA OR VOMITING., Disp: 30 tablet, Rfl: 0   QUEtiapine (SEROQUEL) 25 MG tablet, Take 25 mg by mouth as needed (relalaxion)., Disp: , Rfl:    UNIFINE PENTIPS 31G X 8 MM MISC, USE AS DIRECTED DAILY AFTER SUPPER, Disp: 100 each, Rfl: 3  Allergies:  Allergies  Allergen Reactions   Codeine Swelling   Codeine Rash and Other (See Comments)    Unknown reaction (patient says it was more serious than just a rash, but he can't remember what happened)       Physical  Exam:        Blood pressure (!) 112/53, pulse 81, temperature (!) 97.5 F (36.4 C), resp. rate 20, weight 187 lb 11.2 oz (85.1 kg), SpO2 99 %.          ECOG: 1   General appearance: Alert, awake without any distress. Head: Atraumatic without abnormalities Oropharynx: Without any thrush or ulcers. Eyes: No scleral icterus. Lymph nodes: No lymphadenopathy noted in the cervical, supraclavicular, or axillary nodes Heart:regular rate and rhythm, without any murmurs or gallops.   Lung: Clear to auscultation without any rhonchi, wheezes or dullness to percussion. Abdomin:  Soft, nontender without any shifting dullness or ascites. Musculoskeletal: No clubbing or cyanosis. Neurological: No motor or sensory deficits. Skin: No rashes or lesions.                      Lab Results: Lab Results  Component Value Date   WBC 7.1 09/26/2021   HGB 10.1 (L) 09/26/2021   HCT 26.9 (L) 09/26/2021   MCV 84.9 09/26/2021   PLT 251 09/26/2021     Chemistry      Component Value Date/Time   NA 141 09/26/2021 0833   NA 141 07/19/2020 0000   NA 138 07/03/2017 0813   K 4.3 09/26/2021 0833   K 4.2 07/03/2017 0813   CL 96 (L) 09/26/2021 0833   CO2 33 (H) 09/26/2021 0833   CO2 27 07/03/2017 0813   BUN 32 (H) 09/26/2021 0833   BUN 65 (A) 08/15/2020 0000   BUN 46.4 (H) 07/03/2017 0813   CREATININE 7.16 (HH) 09/26/2021 0833   CREATININE 10.9 (HH) 07/03/2017 0813   GLU 162 08/15/2020 0000      Component Value Date/Time   CALCIUM 9.3 09/26/2021 0833   CALCIUM 9.3 07/03/2017 0813   ALKPHOS 89 09/26/2021 0833   ALKPHOS 77 07/03/2017 0813   AST 44 (H) 09/26/2021 0833   AST 16 07/03/2017 0813   ALT 41 09/26/2021 0833   ALT 14 07/03/2017 0813   BILITOT 0.8 09/26/2021 0833   BILITOT 0.93 07/03/2017 0813         Impression and Plan:  56 year old man with:  1.  Multiple myeloma diagnosed in 2019.  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  Laboratory data  obtained on September 26, 2021 was personally reviewed and continues to show no M spike detected.  Serum light chains also show ratio that is stable.  Risks and benefits of continuing active surveillance versus active treatment were discussed.  Given his overall performance status and disease status I have recommended continued active surveillance at this time.  2. Anemia: Related to plasma cell disorder and renal failure and hemoglobin is stable.   3.  Renal failure: He continues to be dialysis dependent at this time.  No recent exacerbation noted.  4. Goals of care: His disease is incurable although it is in remission at this time.  Aggressive measures are warranted for relapse.   6. Follow-up: He will return in 3 months for a follow-up visit.   30 minutes were spent on this visit.  Time was dedicated to reviewing laboratory data, disease status update and outlining future plan of care discussion. Zola Button, MD 1/26/20239:28 AM

## 2021-10-04 DIAGNOSIS — Z992 Dependence on renal dialysis: Secondary | ICD-10-CM | POA: Diagnosis not present

## 2021-10-04 DIAGNOSIS — N186 End stage renal disease: Secondary | ICD-10-CM | POA: Diagnosis not present

## 2021-10-04 DIAGNOSIS — N2581 Secondary hyperparathyroidism of renal origin: Secondary | ICD-10-CM | POA: Diagnosis not present

## 2021-10-07 DIAGNOSIS — Z992 Dependence on renal dialysis: Secondary | ICD-10-CM | POA: Diagnosis not present

## 2021-10-07 DIAGNOSIS — N2581 Secondary hyperparathyroidism of renal origin: Secondary | ICD-10-CM | POA: Diagnosis not present

## 2021-10-07 DIAGNOSIS — N186 End stage renal disease: Secondary | ICD-10-CM | POA: Diagnosis not present

## 2021-10-08 ENCOUNTER — Ambulatory Visit (INDEPENDENT_AMBULATORY_CARE_PROVIDER_SITE_OTHER): Payer: Medicare Other | Admitting: Neurology

## 2021-10-08 ENCOUNTER — Other Ambulatory Visit: Payer: Self-pay

## 2021-10-08 ENCOUNTER — Encounter: Payer: Self-pay | Admitting: Neurology

## 2021-10-08 ENCOUNTER — Encounter: Payer: Self-pay | Admitting: Oncology

## 2021-10-08 VITALS — BP 142/55 | HR 67 | Ht 71.0 in | Wt 188.4 lb

## 2021-10-08 DIAGNOSIS — G40209 Localization-related (focal) (partial) symptomatic epilepsy and epileptic syndromes with complex partial seizures, not intractable, without status epilepticus: Secondary | ICD-10-CM | POA: Diagnosis not present

## 2021-10-08 DIAGNOSIS — F22 Delusional disorders: Secondary | ICD-10-CM | POA: Diagnosis not present

## 2021-10-08 NOTE — Progress Notes (Signed)
Asking for 90 day refills

## 2021-10-08 NOTE — Patient Instructions (Signed)
Good to see you. Proceed with Neurocognitive testing scheduled in February. Continue all your medications. Follow-up in 4-5 months, call for any changes

## 2021-10-08 NOTE — Progress Notes (Signed)
NEUROLOGY FOLLOW UP OFFICE NOTE  Isaiah [name] [name] [id number] 03-May-1966  HISTORY OF PRESENT ILLNESS: I had the pleasure of seeing Isaiah [name] in the neurology clinic on 10/08/2021.  The patient was last seen 3 months ago. He is again accompanied by his wife Isaiah [name] who helps supplement the history today. He has a history of seizures secondary to left MCA stroke in 4315, complicated by right subdural hematoma in 2020. It appears he has vascular cognitive impairment as well, with associated behavioral changes (paranoid delusions that his wife is cheating on him). MMSE 17/30 in 03/2021. Brain MRI without contrast in 03/2021 with no acute changes, there were areas of encephalomalacia and gliosis in the right frontal lobe, right parietooccipital region, left frontoparietal region and insular region, left occipital and temporal lobes, small chronic subdural fluid collection in the right cerebral convexity without mass effect. He has not had any seizures during HD since 05/2019 on clonazepam 9m prior to HD. He had a left temporal seizure during his EMU admission in 12/2020 when seizure medications were tapered. He continues on Vimpat 2022mBID and Lamotrigine 20072mID without side effects.   On his last visit, he was started on Lexapro 59m67mily due to mood changes. It appeared to help initially, he states his mood is good. He is sleeping overall okay. [place] in [place] 2 [place]. SylvSunday Spillerses that the delusions had calmed down but started back up. On 12/21, he was "in other Isaiah [name] mood," he got up at 2:15am and walked independently with a walker. He said there was a man in the room, and when he turned around he fell onto the table. He had another fall on 12/24. On 12/27, he accused her of sneaking downstairs and a few days later was saying unkind things. On 12/30, he was "totally off the charts talking about a lawyer and judge. On 1/12, he was asking her to leave his room,  wanting his cane. Then on 1/21, he was yelling out to stop smoking. He states he has seen the man coming up the stairs. He did not see his face because SylvSunday Spillersd him not to show his face and told him "don't tell anything." He states he sneaks up and down stairs, "he never goes where I can see him, I think he sees me a lot but makes sure I'm either asleep or sneaking in the house." He states he is not imagining this, "I know that I know he is coming into the house, I know she meets with him somewhere in the house and my son helps her. I think he goes in my son's room" when he is about to come out. He has had 2 headaches. Yesterday morning he could not get himself together, holding his walker but not moving.   History on Initial Assessment 10/29/2017: This is a pleasant 51 y68RH man with a history of diabetes, sickle-cell thalassemia, DVT and PE off Coumadin due to GI bleed, sleep apnea, MGUS, CKD on dialysis, and strokes in multiple vascular distributions in January 2017 with subsequent seizures. Stroke workup showed left M2 superior branch occlusion, TEE with PFO, hypercoagulation studies negative. He is now on aspirin for secondary stroke prevention. He made significant improvement with right-sided weakness and speech was more fluent. He had a seizure in April 2017 and was started on Vimpat 100mg58m. EEG reported diffuse slowing. Limited MRI brain showed multiple late subacute and chronic infarcts, and repeat  imaging in May 2017 showed resolution with no new infarct. He had another seizure while in [place] in September 2017 after dialysis and was found to be anemic with Hgb of 6.8, requiring multiple blood transfusions. Vimpat increased to 170m BID. EEG showed mild intermittent generalized theta slowing. He had another seizure after dialysis in November 2017 and Dilantin 3040mdaily was added. His wife did not want him to be on this long term, and was switched to Lamotrigine. He had another seizure in January  2018 a few days after Dilantin was tapered off. He had another seizure in October 2018 again after dialysis. The last seizure was on 10/19/17 again after dialysis. This was slightly different because he was belligerent and agitated and his wife had to calm him down. He is taking Vimpat 15043mID and Lamotrigine 150m24mD with no side effects. He sometimes has a warning prior to the seizures. With the most recent seizure, he was standing at the kitchen table, recalls his wife picking him up, then coming to on the sofa. Seizures last 5-10 minutes, his wife describes staring off, unresponsive, then trembling/low amplitude shaking. He is tired after, with worsened right-sided weakness. He would be "pretty out of it" the next day. Majority of seizures occur right after dialysis, he would still be sitting in the dialysis unit, but the most recent seizure occurred at home an hour after dialysis. He reports BP is not an issue because his nephrologist has regulated his medications and BP is good most of the time.    He has occasional tremors in both legs, he has a right hand tremor noted in the office today. He noticed memory changes after his stroke, but does not think memory is worse after each seizure. He wonders about his short-term memory, sometimes he has to think which is his right or left side. He denies any olfactory/gustatory hallucinations, deja vu, rising epigastric sensation,myoclonic jerks.    Epilepsy Risk Factors:  Prior strokes in multiple vascular distributions. Otherwise he had a normal birth and early development.  There is no history of febrile convulsions, CNS infections such as meningitis/encephalitis, significant traumatic brain injury, neurosurgical procedures, or family history of seizures.   Diagnostic Data: I personally reviewed MRI brain without contrast done 01/2016 which showed left MCA infarct, right posterior temporal and occipital hemorrhagic infarct, left frontal infarct with remote  blood products, remote hemorrhagic infarct in the more superior right frontal lobe, remote nonhemorrhagic lacunar infarcts in the left cerebellum.   48-hour EEG in Jan 2021 showed occasional left temporal slowing, no epileptiform discharges. Typical episode of confusion and weakness captured did not show epileptiform correlate.   EMU 12/2020: His wife continued to report episodes where he had a blank look but was out of it, having a hard time following instructions. He was admitted to the EMU from 12/31/20 to 01/04/21 where typical events were not captured. He did have a seizure with right gaze deviation followed by right head version and body turning to the right followed by left arm jerking, gaze deviation to the left that progressed to a convulsion. EEG showed left frontotemporal origin. There was one episode of confusion with no EEG change seen.   Prior AEDs: Dilantin   PAST MEDICAL HISTORY: Past Medical History:  Diagnosis Date   A-fib (HCC)[place] Anemia    Diabetic retinopathy (HCC)[place] [facility] OU   DM type 2 (diabetes mellitus, type 2) (HCC)[place]/09/2018   Echocardiogram 12/22  Echocardiogram 12/22: mod septal LVH o/w mild conc LVH, EF 55-60, no RWMA, Gr 2 DD, normal RVSF, severe LAE, trivial MR   ESRD (end stage renal disease) on dialysis ([place])    Essential hypertension 06/09/2019   GIB (gastrointestinal bleeding)    Recurrent episodes- 09/2014, 09/2015 and 05/2016   Gout    History of recent blood transfusion 10/27/2014   2 Units PRBC's   Hyperkalemia 07/2011   Hypertensive retinopathy    OU   Multiple myeloma ([place])    Pulmonary embolism ([place]) 07/2011; 09/27/2014   a. Bilat PE 07/2011 - unclear cause, tx with 6 months Coumadin.;    Seizure disorder ([place]) 06/09/2019   Sepsis ([place])    Sickle cell-thalassemia disease ([place])    a. Sickle cell trait.   Stroke ([place]) 09/2015   R-MCA, L-MCA, PCA and bilateral cerebellar complicated by DVT/PE   Subdural hematoma 05/2019     MEDICATIONS: Current Outpatient Medications on File Prior to Visit  Medication Sig Dispense Refill   acetaminophen (TYLENOL) 325 MG tablet Take 1-2 tablets (325-650 mg total) by mouth every 4 (four) hours as needed for mild pain.     albuterol (PROAIR HFA) 108 (90 Base) MCG/ACT inhaler Inhale two puffs every 4-6 hours if needed for cough or wheeze 6.7 g 2   Alcohol Swabs (SM ALCOHOL PREP) 70 % PADS      atorvastatin (LIPITOR) 20 MG tablet TAKE 1 TABLET BY MOUTH ONCE A DAY 90 tablet 2   AURYXIA 1 GM 210 MG(Fe) tablet Take 210 mg by mouth every evening.     brimonidine (ALPHAGAN) 0.2 % ophthalmic solution  (Patient not taking: Reported on 08/27/2021)     calcitRIOL (ROCALTROL) 0.25 MCG capsule Take 0.25 mcg by mouth daily.     Cetirizine HCl 10 MG CAPS Take 1 capsule by mouth daily.      Cholecalciferol (D3-1000 PO) Take 1 tablet by mouth See admin instructions. Take one tablet by mouth on Monday,Wednesday and Fridays     clonazePAM (KLONOPIN) 0.5 MG tablet Take 2 tablets in morning on Monday, Wednesday, Friday before dialysis 60 tablet 5   escitalopram (LEXAPRO) 10 MG tablet Take 1 tablet (10 mg total) by mouth daily. 30 tablet 6   ferric citrate (AURYXIA) 1 GM 210 MG(Fe) tablet Take 1 tablet by mouth 3 times a day with meals as directed 270 tablet 3   fluticasone (FLONASE) 50 MCG/ACT nasal spray Place 1 spray into both nostrils as needed for allergies.     fluticasone furoate-vilanterol (BREO ELLIPTA) 200-25 MCG/ACT AEPB Inhale 1 puff by mouth into the lungs at the same time everyday 60 each 1   fluticasone furoate-vilanterol (BREO ELLIPTA) 200-25 MCG/INH AEPB INHALE 1 PUFF BY MOUTH INTO THE LUNGS AT THE SAME TIME EVERY DAY 60 each 2   FREESTYLE LITE test strip      Glucagon, rDNA, (GLUCAGON EMERGENCY) 1 MG KIT See admin instructions.     glucose blood (FREESTYLE LITE) test strip Use as directed to test blood glucose levels 3 times daily. 300 each 3   hydrOXYzine (ATARAX) 25 MG tablet Take  1 tablet (25 mg total) by mouth as needed. 30 tablet 1   insulin glargine-yfgn (SEMGLEE, YFGN,) 100 UNIT/ML Pen Inject 6 units under the skin once daily (discard pen after 28 days from first use) 3 mL 0   Insulin Glargine-yfgn (SEMGLEE, YFGN,) 100 UNIT/ML SOPN Inject 6 units subcutaneously once a day 9 mL 4   Insulin Lispro (HUMALOG KWIKPEN [place])  Inject 2-4 Units into the skin 3 (three) times daily before meals. Sliding scale: 150-250 =2 units, 250 -350=3 units, 350 += 4 units     Insulin Pen Needle 32G X 4 MM MISC USE AS DIRECTED 4 TO 5 TIMES DAILY TO INJECT INSULIN 400 each 6   lacosamide (VIMPAT) 200 MG TABS tablet TAKE 1 TABLET TWICE A DAY EXCEPT ON DIALYSIS DAYS M-W-F GIVE 1 TAB IN AM, 1 TAB AFTER DIALYSIS, 1 TAB IN PM. 90 tablet 5   lamoTRIgine (LAMICTAL) 200 MG tablet TAKE 1 TABLET BY MOUTH 2 TIMES DAILY 180 tablet 3   Lancets (FREESTYLE) lancets      Lancets (FREESTYLE) lancets Use as directed to test blood glucose levels three times daily. 300 each 4   Methoxy PEG-Epoetin Beta (MIRCERA IJ)      metoprolol succinate (TOPROL-XL) 25 MG 24 hr tablet Take 25 mg by mouth 2 (two) times daily.     midodrine (PROAMATINE) 10 MG tablet Take 1 tablet (10 mg total) by mouth 2 (two) times daily. 180 tablet 1   multivitamin (RENA-VIT) TABS tablet Take 1 tablet by mouth at bedtime.     pantoprazole (PROTONIX) 40 MG tablet TAKE 1 TABLET BY MOUTH ONCE DAILY 90 tablet 1   prochlorperazine (COMPAZINE) 10 MG tablet Take 10 mg by mouth every 6 (six) hours as needed for nausea.     prochlorperazine (COMPAZINE) 10 MG tablet TAKE 1 TABLET BY MOUTH EVERY 6 HOURS AS NEEDED FOR NAUSEA OR VOMITING. 30 tablet 0   QUEtiapine (SEROQUEL) 25 MG tablet Take 25 mg by mouth as needed (relalaxion).     UNIFINE PENTIPS 31G X 8 MM MISC USE AS DIRECTED DAILY AFTER SUPPER 100 each 3   No current facility-administered medications on file prior to visit.    ALLERGIES: Allergies  Allergen Reactions   Codeine Swelling   Codeine  Rash and Other (See Comments)    Unknown reaction (patient says it was more serious than just a rash, but he can't remember what happened)     FAMILY HISTORY: Family History  Problem Relation Age of Onset   Hypertension Mother    Hypertension Father    Stroke Father    Sickle cell anemia Brother    Diabetes Mother    Sickle cell anemia Brother    Diabetes type I Brother    Kidney disease Brother     SOCIAL HISTORY: Social History   Socioeconomic History   Marital status: Married    Spouse name: Isaiah [name]   Number of children: 1   Years of education: college   Highest education level: Not on file  Occupational History   Occupation: [employer]   Occupation: disability  Tobacco Use   Smoking status: Never   Smokeless tobacco: Never  Vaping Use   Vaping Use: Never used  Substance and Sexual Activity   Alcohol use: Not Currently   Drug use: Never   Sexual activity: Yes    Partners: Female  Other Topics Concern   Not on file  Social History Narrative   ** Merged History Encounter **       Pt lives in 2 story home with his wife and 1 son   Has masters degree in psychology   Currently disabled.   Right handed       Social Determinants of Health   Financial Resource Strain: Low Risk    Difficulty of Paying Living Expenses: Not hard at all  Food Insecurity: No Food  Insecurity   Worried About Charity fundraiser in the Last Year: Never true   [place] in the Last Year: Never true  Transportation Needs: No Transportation Needs   Lack of Transportation (Medical): No   Lack of Transportation (Non-Medical): No  Physical Activity: Sufficiently Active   Days of Exercise per Week: 3 days   Minutes of Exercise per Session: 60 min  Stress: No Stress Concern Present   Feeling of Stress : Only a little  Social Connections: Not on file  Intimate Partner Violence: Not At Risk   Fear of Current or Ex-Partner: No   Emotionally Abused: No   Physically  Abused: No   Sexually Abused: No     PHYSICAL EXAM: Vitals:   10/08/21 1131  BP: (!) 142/55  Pulse: 67  SpO2: 98%   General: No acute distress Head:  Normocephalic/atraumatic Skin/Extremities: No rash, no edema Neurological Exam: alert and awake. No aphasia or dysarthria. Fund of knowledge is reduced, he continues to have paranoid delusions as noted above. Attention and concentration are normal.   Cranial nerves: Pupils equal, round. Extraocular movements intact with no nystagmus. Visual fields full.  No facial asymmetry.  Motor: increased on right UE with spastic contracture of fingers on right hand. 5/5 on left UE and LE, 4+/5 on right UE and LE.  Finger to nose testing intact on left, ataxic on right due to weakness.    IMPRESSION: This is a pleasant 56 yo RH man with a history of  diabetes, sickle-cell thalassemia, DVT and PE off Coumadin due to GI bleed, sleep apnea, CKD on dialysis, strokes in multiple vascular distributions in January 2017 with subsequent seizures. He had a complicated course while preparing for stem cell transplant for multiple myeloma, found to have a nontraumatic right subdural hematoma in 05/2019. The convulsive seizures occurring during dialysis have not occurred since clonazepam 69m prior to HD was started in 05/2019. He was started on Lexapro on last visit, he did not want to take Seroquel. He continue to have paranoid delusions as noted above, symptoms concerning for vascular dementia with behavioral disturbance, proceed with Neuropsychological testing as scheduled. Continue Lexapro 186mdaily, Lacosamide 20044mID, Lamotrigine 200m48mD, and clonazepam 1mg 16mor to HD.    Thank you for allowing me to participate in his care.  Please do not hesitate to call for any questions or concerns.    KarenEllouise Newer.   CC: Dr. SandeBaird Cancer

## 2021-10-09 DIAGNOSIS — D631 Anemia in chronic kidney disease: Secondary | ICD-10-CM | POA: Diagnosis not present

## 2021-10-09 DIAGNOSIS — N2581 Secondary hyperparathyroidism of renal origin: Secondary | ICD-10-CM | POA: Diagnosis not present

## 2021-10-09 DIAGNOSIS — E1122 Type 2 diabetes mellitus with diabetic chronic kidney disease: Secondary | ICD-10-CM | POA: Diagnosis not present

## 2021-10-09 DIAGNOSIS — Z992 Dependence on renal dialysis: Secondary | ICD-10-CM | POA: Diagnosis not present

## 2021-10-09 DIAGNOSIS — N186 End stage renal disease: Secondary | ICD-10-CM | POA: Diagnosis not present

## 2021-10-09 DIAGNOSIS — Z23 Encounter for immunization: Secondary | ICD-10-CM | POA: Diagnosis not present

## 2021-10-10 ENCOUNTER — Encounter: Payer: Self-pay | Admitting: Oncology

## 2021-10-11 ENCOUNTER — Telehealth: Payer: Medicare Other

## 2021-10-11 DIAGNOSIS — N186 End stage renal disease: Secondary | ICD-10-CM | POA: Diagnosis not present

## 2021-10-11 DIAGNOSIS — Z23 Encounter for immunization: Secondary | ICD-10-CM | POA: Diagnosis not present

## 2021-10-11 DIAGNOSIS — N2581 Secondary hyperparathyroidism of renal origin: Secondary | ICD-10-CM | POA: Diagnosis not present

## 2021-10-11 DIAGNOSIS — Z992 Dependence on renal dialysis: Secondary | ICD-10-CM | POA: Diagnosis not present

## 2021-10-11 DIAGNOSIS — D631 Anemia in chronic kidney disease: Secondary | ICD-10-CM | POA: Diagnosis not present

## 2021-10-14 DIAGNOSIS — D631 Anemia in chronic kidney disease: Secondary | ICD-10-CM | POA: Diagnosis not present

## 2021-10-14 DIAGNOSIS — Z23 Encounter for immunization: Secondary | ICD-10-CM | POA: Diagnosis not present

## 2021-10-14 DIAGNOSIS — N186 End stage renal disease: Secondary | ICD-10-CM | POA: Diagnosis not present

## 2021-10-14 DIAGNOSIS — N2581 Secondary hyperparathyroidism of renal origin: Secondary | ICD-10-CM | POA: Diagnosis not present

## 2021-10-14 DIAGNOSIS — Z992 Dependence on renal dialysis: Secondary | ICD-10-CM | POA: Diagnosis not present

## 2021-10-16 DIAGNOSIS — Z23 Encounter for immunization: Secondary | ICD-10-CM | POA: Diagnosis not present

## 2021-10-16 DIAGNOSIS — Z992 Dependence on renal dialysis: Secondary | ICD-10-CM | POA: Diagnosis not present

## 2021-10-16 DIAGNOSIS — N186 End stage renal disease: Secondary | ICD-10-CM | POA: Diagnosis not present

## 2021-10-16 DIAGNOSIS — N2581 Secondary hyperparathyroidism of renal origin: Secondary | ICD-10-CM | POA: Diagnosis not present

## 2021-10-16 DIAGNOSIS — D631 Anemia in chronic kidney disease: Secondary | ICD-10-CM | POA: Diagnosis not present

## 2021-10-17 NOTE — Progress Notes (Signed)
[facility] Note  10/22/2021     CHIEF COMPLAINT Patient presents for Retina Follow Up   HISTORY OF PRESENT ILLNESS: Isaiah [name] is a 56 y.o. male who presents to the clinic today for:   HPI     Retina Follow Up   Patient presents with  Diabetic Retinopathy.  In both eyes.  Severity is moderate.  Duration of 8 weeks.  I, the attending physician,  performed the HPI with the patient and updated documentation appropriately.        Comments   Patient states vision the same OU. BS was 142 this am.       Last edited by [name], MD on 10/22/2021  1:31 PM.     Pt feels like left eye is a little weaker   Referring physician: [name], MD [address] [place],  [place] [address]  HISTORICAL INFORMATION:   Selected notes from the [place] [facility] of Dr. [name].  Urgent re-referral from Dr. [name] for possible PDR w/NVI OD.  Presented to [facility] 7[facility]27[facility]21 w/elevated IOPS and OD swelling and irritation.  Started on Brimonidine BID OD and Pred Q1hr OD.   CURRENT MEDICATIONS: No current outpatient medications on file. (Ophthalmic Drugs)   No current facility-administered medications for this visit. (Ophthalmic Drugs)   Current Outpatient Medications (Other)  Medication Sig   acetaminophen (TYLENOL) 325 MG tablet Take 1-2 tablets (325-650 mg total) by mouth every 4 (four) hours as needed for mild pain.   albuterol (PROAIR HFA) 108 (90 Base) MCG/ACT inhaler Inhale two puffs every 4-6 hours if needed for cough or wheeze   Alcohol Swabs (SM ALCOHOL PREP) 70 % PADS    atorvastatin (LIPITOR) 20 MG tablet TAKE 1 TABLET BY MOUTH ONCE A DAY   AURYXIA 1 GM 210 MG(Fe) tablet Take 210 mg by mouth every evening.   calcitRIOL (ROCALTROL) 0.25 MCG capsule Take 0.25 mcg by mouth daily.   Cetirizine HCl 10 MG CAPS Take 1 capsule by mouth daily.    Cholecalciferol (D3-1000 PO) Take 1 tablet by mouth See admin instructions.  Take one tablet by mouth on Monday,Wednesday and Fridays   clonazePAM (KLONOPIN) 0.5 MG tablet Take 2 tablets in morning on Monday, Wednesday, Friday before dialysis   escitalopram (LEXAPRO) 10 MG tablet Take 1 tablet (10 mg total) by mouth daily.   ferric citrate (AURYXIA) 1 GM 210 MG(Fe) tablet Take 1 tablet by mouth 3 times a day with meals as directed   fluticasone (FLONASE) 50 MCG/ACT nasal spray Place 1 spray into both nostrils as needed for allergies.   fluticasone furoate-vilanterol (BREO ELLIPTA) 200-25 MCG/ACT AEPB Inhale 1 puff by mouth into the lungs at the same time everyday   FREESTYLE LITE test strip    Glucagon, rDNA, (GLUCAGON EMERGENCY) 1 MG KIT See admin instructions.   glucose blood (FREESTYLE LITE) test strip Use as directed to test blood glucose levels 3 times daily.   hydrOXYzine (ATARAX) 25 MG tablet Take 1 tablet (25 mg total) by mouth as needed.   insulin glargine-yfgn (SEMGLEE, YFGN,) 100 UNIT/ML Pen Inject 6 units under the skin once daily (discard pen after 28 days from first use)   Insulin Glargine-yfgn (SEMGLEE, YFGN,) 100 UNIT/ML SOPN Inject 6 units subcutaneously once a day   Insulin Lispro (HUMALOG KWIKPEN [place]) Inject 2-4 Units into the skin 3 (three) times daily before meals. Sliding scale: 150-250 =2 units, 250 -350=3 units,  350 += 4 units   Insulin Pen Needle 32G X 4 MM MISC USE AS DIRECTED 4 TO 5 TIMES DAILY TO INJECT INSULIN   lacosamide (VIMPAT) 200 MG TABS tablet TAKE 1 TABLET TWICE A DAY EXCEPT ON DIALYSIS DAYS M-W-F GIVE 1 TAB IN AM, 1 TAB AFTER DIALYSIS, 1 TAB IN PM.   lamoTRIgine (LAMICTAL) 200 MG tablet TAKE 1 TABLET BY MOUTH 2 TIMES DAILY   Lancets (FREESTYLE) lancets    Lancets (FREESTYLE) lancets Use as directed to test blood glucose levels three times daily.   Methoxy PEG-Epoetin Beta (MIRCERA IJ)    metoprolol succinate (TOPROL-XL) 25 MG 24 hr tablet Take 25 mg by mouth 2 (two) times daily.   midodrine (PROAMATINE) 10 MG tablet Take 1 tablet (10  mg total) by mouth 2 (two) times daily.   multivitamin (RENA-VIT) TABS tablet Take 1 tablet by mouth at bedtime.   pantoprazole (PROTONIX) 40 MG tablet TAKE 1 TABLET BY MOUTH ONCE DAILY   prochlorperazine (COMPAZINE) 10 MG tablet Take 10 mg by mouth every 6 (six) hours as needed for nausea.   prochlorperazine (COMPAZINE) 10 MG tablet TAKE 1 TABLET BY MOUTH EVERY 6 HOURS AS NEEDED FOR NAUSEA OR VOMITING.   QUEtiapine (SEROQUEL) 25 MG tablet Take 25 mg by mouth as needed (relalaxion).   UNIFINE PENTIPS 31G X 8 MM MISC USE AS DIRECTED DAILY AFTER SUPPER   fluticasone furoate-vilanterol (BREO ELLIPTA) 200-25 MCG/INH AEPB INHALE 1 PUFF BY MOUTH INTO THE LUNGS AT THE SAME TIME EVERY DAY   No current facility-administered medications for this visit. (Other)   REVIEW OF SYSTEMS: ROS   Positive for: Gastrointestinal, Neurological, Genitourinary, Endocrine, Cardiovascular, Eyes, Respiratory Negative for: Constitutional, Skin, Musculoskeletal, HENT, Psychiatric, Allergic/Imm, Heme/Lymph Last edited by Jobe Marker, COT on 10/22/2021 10:06 AM.     ALLERGIES Allergies  Allergen Reactions   Codeine Swelling   Codeine Rash and Other (See Comments)    Unknown reaction (patient says it was more serious than just a rash, but he can't remember what happened)    PAST MEDICAL HISTORY Past Medical History:  Diagnosis Date   A-fib ([place])    Anemia    Diabetic retinopathy ([place])    PDR OU   DM type 2 (diabetes mellitus, type 2) ([place]) 06/09/2019   Echocardiogram 12/22    Echocardiogram 12/22: mod septal LVH o/w mild conc LVH, EF 55-60, no RWMA, Gr 2 DD, normal RVSF, severe LAE, trivial MR   ESRD (end stage renal disease) on dialysis ([place])    Essential hypertension 06/09/2019   GIB (gastrointestinal bleeding)    Recurrent episodes- 09/2014, 09/2015 and 05/2016   Gout    History of recent blood transfusion 10/27/2014   2 Units PRBC's   Hyperkalemia 07/2011   Hypertensive retinopathy    OU    Multiple myeloma ([place])    Pulmonary embolism ([place]) 07/2011; 09/27/2014   a. Bilat PE 07/2011 - unclear cause, tx with 6 months Coumadin.;    Seizure disorder ([place]) 06/09/2019   Sepsis ([place])    Sickle cell-thalassemia disease ([place])    a. Sickle cell trait.   Stroke ([place]) 09/2015   R-MCA, L-MCA, PCA and bilateral cerebellar complicated by DVT/PE   Subdural hematoma 05/2019   Past Surgical History:  Procedure Laterality Date   AV FISTULA PLACEMENT Right 12/05/2015   Procedure: INSERTION OF ARTERIOVENOUS (AV) GORE-TEX GRAFT ARM;  Surgeon: Elam Dutch, MD;  Location: [facility];  Service: Vascular;  Laterality: Right;   BONE  MARROW BIOPSY     CATARACT EXTRACTION Right 09/2019   Dr. Herbert Deaner   CATARACT EXTRACTION Left 10/2019   Dr. Herbert Deaner   CHOLECYSTECTOMY     CHOLECYSTECTOMY  [id number]?   COLONOSCOPY WITH PROPOFOL N/A 01/22/2017   Procedure: COLONOSCOPY WITH PROPOFOL;  Surgeon: Wilford Corner, MD;  Location: [facility];  Service: Endoscopy;  Laterality: N/A;   EYE SURGERY Bilateral    Cat Sx - Dr. Herbert Deaner   INSERTION OF DIALYSIS CATHETER Right 10/16/2015   Procedure: INSERTION OF PALINDROME DIALYSIS CATHETER ;  Surgeon: Elam Dutch, MD;  Location: [facility];  Service: Vascular;  Laterality: Right;   OTHER SURGICAL HISTORY     Retinal surgery   PARS PLANA VITRECTOMY  02/17/2012   Procedure: PARS PLANA VITRECTOMY WITH 25 GAUGE;  Surgeon: Hayden Pedro, MD;  Location: [facility];  Service: Ophthalmology;  Laterality: Right;   PERIPHERAL VASCULAR CATHETERIZATION N/A 03/20/2016   Procedure: A/V Shuntogram/Fistulagram;  Surgeon: Conrad Covington, MD;  Location: [facility];  Service: Cardiovascular;  Laterality: N/A;   TEE WITHOUT CARDIOVERSION N/A 12/02/2016   Procedure: TRANSESOPHAGEAL ECHOCARDIOGRAM (TEE);  Surgeon: Larey Dresser, MD;  Location: [facility] ENDOSCOPY;  Service: Cardiovascular;  Laterality: N/A;   FAMILY HISTORY Family History  Problem Relation Age of Onset   Hypertension Mother     Hypertension Father    Stroke Father    Sickle cell anemia Brother    Diabetes Mother    Sickle cell anemia Brother    Diabetes type I Brother    Kidney disease Brother    SOCIAL HISTORY Social History   Tobacco Use   Smoking status: Never   Smokeless tobacco: Never  Vaping Use   Vaping Use: Never used  Substance Use Topics   Alcohol use: Not Currently   Drug use: Never       OPHTHALMIC EXAM:  Base Eye Exam     Visual Acuity (Snellen - Linear)       Right Left   Dist cc CF at 3' 20/150 -2   Dist ph cc NI 20/100 +1    Correction: Glasses         Tonometry (Tonopen, 10:16 AM)       Right Left   Pressure 15 14         Pupils       Dark Light Shape React APD   Right 3 2 Round Minimal None   Left 3 2 Round Minimal None         Visual Fields (Counting fingers)       Left Right    Full    Restrictions  Total inferior temporal, superior nasal, inferior nasal deficiencies; Partial outer superior temporal deficiency         Extraocular Movement       Right Left    Full Full         Neuro/Psych     Oriented x3: Yes   Mood/Affect: Normal         Dilation     Both eyes: 1.0% Mydriacyl, 2.5% Phenylephrine @ 10:16 AM           Slit Lamp and Fundus Exam     Slit Lamp Exam       Right Left   Lids/Lashes Dermato Dermato   Conjunctiva/Sclera Melanosis Melanosis   Cornea Arcus, well healed cataract wound, 2-3+ inferior PEE, dry tear film 1-2+ inferior PEE, well healed temporal cataract wounds   Anterior Chamber Deep  and clear Deep and quiet   Iris Round and poorly dilated to 4.74m, NVI stably regressed Round and dilated, No NVI   Lens PC IOL in good position PCIOL   Anterior Vitreous Post vitrectomy, Residual anterior vitreous syneresis, +RBC - improved, +pigment Mild asteroid hyalosis, syneresis         Fundus Exam       Right Left   Disc 4+pallor. Sharp rim, +cupping Hazy view, mild pallor, sharp rim   C/D Ratio 0.7 0.6    Macula Blunted foveal reflex, atrophic; ischemic, no heme Flat, Blunted foveal reflex, No heme or edema   Vessels Attenuated, tortuous, severe attenuation, almost complete sclerosis Attenuated, tortuous, ?sclerosis   Periphery Attached, good 360 PRP w/ fill in Attached, 360 PRP           Refraction     Wearing Rx       Sphere Cylinder Axis Add   Right -1.75 +0.50 090 +2.50   Left -1.50 Sphere  +2.50           IMAGING AND PROCEDURES  Imaging and Procedures for 10/22/2021  OCT, Retina - OU - Both Eyes       Right Eye Quality was borderline. Central Foveal Thickness: 203. Progression has been stable. Findings include normal foveal contour, no IRF, no SRF, outer retinal atrophy, epiretinal membrane, inner retinal atrophy, intraretinal hyper-reflective material (diffuse retinal atrophy -- nasal thickening stably improved, mild ERM).   Left Eye Quality was borderline. Central Foveal Thickness: 237. Progression has been stable. Findings include normal foveal contour, no SRF, epiretinal membrane, no IRF (Diffuse retinal atrophy/thinning; stable retinoschisis temporal midzone caught on widefield).   Notes *Images captured and stored on drive  Diagnosis / Impression:  OD: retinal ischemia / CRAO -- diffuse retinal atrophy -- nasal thickening stably improved, mild ERM OS: Diffuse retinal atrophy/thinning; retinoschisis temporal midzone caught on widefield -- stable  Clinical management:  See below  Abbreviations: NFP - Normal foveal profile. CME - cystoid macular edema. PED - pigment epithelial detachment. IRF - intraretinal fluid. SRF - subretinal fluid. EZ - ellipsoid zone. ERM - epiretinal membrane. ORA - outer retinal atrophy. ORT - outer retinal tubulation. SRHM - subretinal hyper-reflective material. IRHM - intraretinal hyper-reflective material           ASSESSMENT/PLAN:    ICD-10-CM   1. Proliferative diabetic retinopathy of both eyes without macular edema associated  with type 2 diabetes mellitus ([place]  E11.3593 OCT, Retina - OU - Both Eyes    2. Retinal ischemia  H35.82     3. Sickle cell retinopathy without crisis ([place]  D57.1    H36     4. Essential hypertension  I10     5. Hypertensive retinopathy of both eyes  H35.033     6. Pseudophakia of both eyes  Z96.1     7. Bilateral dry eyes  H04.123      1,2. Proliferative diabetic retinopathy w/ severe retinal ischemia, no DME, OU (OD > OS)  - former pt of Dr. MZigmund Daniel last visit 03.04.21  - history of PPV w/ endolaser, FAX OD 6.11.13; PRP OS 2020  - s/p IVA OD #1 (07.29.21) as above for new VH and microhyphema, #2 (07.19.22), #3 (08.23.22), #4 (10.04.22), #5 (11.08.22), #6 (12.20.22) -- for NVI  - s/p PRP OD (08.24.21), fill in (08.02.22)  - BCVA OD stable at CF; OS 20/100 - slightly decreased - OCT without diabetic macular edema, but diffuse atrophy, both eyes -- OD with  RAO-like apprearance -- severe ischemia  - FA (08.12.21) shows severe vascular non-perfusion OD -- peripheral nonperfusion extends posteriorly to just outside arcades   - exam and gonio showed new NVI and NVA OD on 7.19.22--regressed post IVA OD on 7.19.22  - NVI remains stably regressed  - IOP OD 15 today --  pt no longer using brimonidine BID OD -- okay to stay off - recommend holding injection OD today - F/u in 8 weeks, DFE, OCT, possible injection  3. History of Sickle Cell Retinopathy  - 360 PRP in place OU  - no active NV, confirmed on FA 8.12.21  4,5. Hypertensive retinopathy OU - discussed importance of tight BP control - monitor  6. Pseudophakia OU  - s/p CE/IOL OU (OD Jan 2021; OS Feb 2021  - IOL in good position, doing well)  - monitor  7. Dry eyes OU - recommend artificial tears and lubricating ointment as needed  Ophthalmic Meds Ordered this visit:  No orders of the defined types were placed in this encounter.     Return in about 2 months (around 12/20/2021) for PDR OU, CRAO OD, DFE, OCT.  There  are no Patient Instructions on file for this visit.  Explained the diagnoses, plan, and follow up with the patient and they expressed understanding.  Patient expressed understanding of the importance of proper follow up care.  This document serves as a record of services personally performed by Gardiner Sleeper, MD, PhD. It was created on their behalf by San Jetty. Owens Shark, OA an ophthalmic technician. The creation of this record is the provider's dictation and/or activities during the visit.    Electronically signed by: San Jetty. Owens Shark, [place] 02.09.2023 1:38 PM  Gardiner Sleeper, M.D., Ph.D. Diseases & Surgery of the Retina and Vitreous [facility]  I have reviewed the above documentation for accuracy and completeness, and I agree with the above. Gardiner Sleeper, M.D., Ph.D. 10/22/21 1:38 PM  Abbreviations: M myopia (nearsighted); A astigmatism; H hyperopia (farsighted); P presbyopia; Mrx spectacle prescription;  CTL contact lenses; OD right eye; OS left eye; OU both eyes  XT exotropia; ET esotropia; PEK punctate epithelial keratitis; PEE punctate epithelial erosions; DES dry eye syndrome; MGD meibomian gland dysfunction; ATs artificial tears; PFAT's preservative free artificial tears; [place] nuclear sclerotic cataract; PSC posterior subcapsular cataract; ERM epi-retinal membrane; PVD posterior vitreous detachment; RD retinal detachment; DM diabetes mellitus; DR diabetic retinopathy; NPDR non-proliferative diabetic retinopathy; PDR proliferative diabetic retinopathy; CSME clinically significant macular edema; DME diabetic macular edema; dbh dot blot hemorrhages; CWS cotton wool spot; POAG primary open angle glaucoma; C/D cup-to-disc ratio; HVF humphrey visual field; GVF goldmann visual field; OCT optical coherence tomography; IOP intraocular pressure; BRVO Branch retinal vein occlusion; CRVO central retinal vein occlusion; CRAO central retinal artery occlusion; BRAO branch retinal artery  occlusion; RT retinal tear; SB scleral buckle; PPV pars plana vitrectomy; VH Vitreous hemorrhage; PRP panretinal laser photocoagulation; IVK intravitreal kenalog; VMT vitreomacular traction; MH Macular hole;  NVD neovascularization of the disc; NVE neovascularization elsewhere; AREDS age related eye disease study; ARMD age related macular degeneration; POAG primary open angle glaucoma; EBMD epithelial/anterior basement membrane dystrophy; ACIOL anterior chamber intraocular lens; IOL intraocular lens; PCIOL posterior chamber intraocular lens; Phaco/IOL phacoemulsification with intraocular lens placement; [place] photorefractive keratectomy; LASIK laser assisted in situ keratomileusis; HTN hypertension; DM diabetes mellitus; COPD chronic obstructive pulmonary disease

## 2021-10-18 DIAGNOSIS — N2581 Secondary hyperparathyroidism of renal origin: Secondary | ICD-10-CM | POA: Diagnosis not present

## 2021-10-18 DIAGNOSIS — Z23 Encounter for immunization: Secondary | ICD-10-CM | POA: Diagnosis not present

## 2021-10-18 DIAGNOSIS — Z992 Dependence on renal dialysis: Secondary | ICD-10-CM | POA: Diagnosis not present

## 2021-10-18 DIAGNOSIS — D631 Anemia in chronic kidney disease: Secondary | ICD-10-CM | POA: Diagnosis not present

## 2021-10-18 DIAGNOSIS — N186 End stage renal disease: Secondary | ICD-10-CM | POA: Diagnosis not present

## 2021-10-21 DIAGNOSIS — Z23 Encounter for immunization: Secondary | ICD-10-CM | POA: Diagnosis not present

## 2021-10-21 DIAGNOSIS — D631 Anemia in chronic kidney disease: Secondary | ICD-10-CM | POA: Diagnosis not present

## 2021-10-21 DIAGNOSIS — Z992 Dependence on renal dialysis: Secondary | ICD-10-CM | POA: Diagnosis not present

## 2021-10-21 DIAGNOSIS — N186 End stage renal disease: Secondary | ICD-10-CM | POA: Diagnosis not present

## 2021-10-21 DIAGNOSIS — N2581 Secondary hyperparathyroidism of renal origin: Secondary | ICD-10-CM | POA: Diagnosis not present

## 2021-10-22 ENCOUNTER — Ambulatory Visit (INDEPENDENT_AMBULATORY_CARE_PROVIDER_SITE_OTHER): Payer: Medicare Other | Admitting: Ophthalmology

## 2021-10-22 ENCOUNTER — Encounter (INDEPENDENT_AMBULATORY_CARE_PROVIDER_SITE_OTHER): Payer: Self-pay | Admitting: Ophthalmology

## 2021-10-22 ENCOUNTER — Other Ambulatory Visit: Payer: Self-pay

## 2021-10-22 DIAGNOSIS — Z961 Presence of intraocular lens: Secondary | ICD-10-CM | POA: Diagnosis not present

## 2021-10-22 DIAGNOSIS — H04123 Dry eye syndrome of bilateral lacrimal glands: Secondary | ICD-10-CM

## 2021-10-22 DIAGNOSIS — H36 Retinal disorders in diseases classified elsewhere: Secondary | ICD-10-CM

## 2021-10-22 DIAGNOSIS — E113593 Type 2 diabetes mellitus with proliferative diabetic retinopathy without macular edema, bilateral: Secondary | ICD-10-CM | POA: Diagnosis not present

## 2021-10-22 DIAGNOSIS — I10 Essential (primary) hypertension: Secondary | ICD-10-CM | POA: Diagnosis not present

## 2021-10-22 DIAGNOSIS — H35033 Hypertensive retinopathy, bilateral: Secondary | ICD-10-CM | POA: Diagnosis not present

## 2021-10-22 DIAGNOSIS — H3582 Retinal ischemia: Secondary | ICD-10-CM

## 2021-10-22 DIAGNOSIS — D571 Sickle-cell disease without crisis: Secondary | ICD-10-CM

## 2021-10-23 DIAGNOSIS — N186 End stage renal disease: Secondary | ICD-10-CM | POA: Diagnosis not present

## 2021-10-23 DIAGNOSIS — Z992 Dependence on renal dialysis: Secondary | ICD-10-CM | POA: Diagnosis not present

## 2021-10-23 DIAGNOSIS — Z23 Encounter for immunization: Secondary | ICD-10-CM | POA: Diagnosis not present

## 2021-10-23 DIAGNOSIS — D631 Anemia in chronic kidney disease: Secondary | ICD-10-CM | POA: Diagnosis not present

## 2021-10-23 DIAGNOSIS — N2581 Secondary hyperparathyroidism of renal origin: Secondary | ICD-10-CM | POA: Diagnosis not present

## 2021-10-25 ENCOUNTER — Telehealth: Payer: Medicare Other

## 2021-10-25 ENCOUNTER — Ambulatory Visit (INDEPENDENT_AMBULATORY_CARE_PROVIDER_SITE_OTHER): Payer: 59

## 2021-10-25 DIAGNOSIS — F01518 Vascular dementia, unspecified severity, with other behavioral disturbance: Secondary | ICD-10-CM

## 2021-10-25 DIAGNOSIS — Z23 Encounter for immunization: Secondary | ICD-10-CM | POA: Diagnosis not present

## 2021-10-25 DIAGNOSIS — N186 End stage renal disease: Secondary | ICD-10-CM | POA: Diagnosis not present

## 2021-10-25 DIAGNOSIS — D631 Anemia in chronic kidney disease: Secondary | ICD-10-CM | POA: Diagnosis not present

## 2021-10-25 DIAGNOSIS — C9000 Multiple myeloma not having achieved remission: Secondary | ICD-10-CM

## 2021-10-25 DIAGNOSIS — N2581 Secondary hyperparathyroidism of renal origin: Secondary | ICD-10-CM | POA: Diagnosis not present

## 2021-10-25 DIAGNOSIS — Z992 Dependence on renal dialysis: Secondary | ICD-10-CM | POA: Diagnosis not present

## 2021-10-25 DIAGNOSIS — E1122 Type 2 diabetes mellitus with diabetic chronic kidney disease: Secondary | ICD-10-CM

## 2021-10-25 NOTE — Patient Instructions (Signed)
Visit Information  Thank you for taking time to visit with me today. Please don't hesitate to contact me if I can be of assistance to you before our next scheduled telephone appointment.  Following are the goals we discussed today:  (Copy and paste patient goals from clinical care plan here)  Our next appointment is by telephone on 11/15/21 at 2:00 PM   Please call the care guide team at [phone] if you need to cancel or reschedule your appointment.   If you are experiencing a Mental Health or [place] or need someone to talk to, please call [phone] (toll free, 24 hour hotline)   Patient verbalizes understanding of instructions and care plan provided today and agrees to view in [place]. Active MyChart status confirmed with patient.    Barb Merino, RN, BSN, CCM Care Management Coordinator [facility]  Direct Phone: [phone]

## 2021-10-25 NOTE — Chronic Care Management (AMB) (Signed)
Chronic Care Management   CCM RN Visit Note  10/25/2021 Name: Isaiah [name] [facility] MRN: [id number] DOB: 14-Dec-1965  Subjective: Isaiah [name] [name], [name] is a 56 y.o. year old male who is a primary care patient of Isaiah Chard, MD. The care management team was consulted for assistance with disease management and care coordination needs.    Engaged with patient by telephone for follow up visit in response to provider referral for case management and/or care coordination services.   Consent to Services:  The patient was given information about Chronic Care Management services, agreed to services, and gave verbal consent prior to initiation of services.  Please see initial visit note for detailed documentation.   Patient agreed to services and verbal consent obtained.   Assessment: Review of patient past medical history, allergies, medications, health status, including review of consultants reports, laboratory and other test data, was performed as part of comprehensive evaluation and provision of chronic care management services.   SDOH (Social Determinants of Health) assessments and interventions performed:  Yes, no acute changes   CCM Care Plan  Allergies  Allergen Reactions   Codeine Swelling   Codeine Rash and Other (See Comments)    Unknown reaction (patient says it was more serious than just a rash, but he can't remember what happened)     Outpatient Encounter Medications as of 10/25/2021  Medication Sig   acetaminophen (TYLENOL) 325 MG tablet Take 1-2 tablets (325-650 mg total) by mouth every 4 (four) hours as needed for mild pain.   albuterol (PROAIR HFA) 108 (90 Base) MCG/ACT inhaler Inhale two puffs every 4-6 hours if needed for cough or wheeze   Alcohol Swabs (SM ALCOHOL PREP) 70 % PADS    atorvastatin (LIPITOR) 20 MG tablet TAKE 1 TABLET BY MOUTH ONCE A DAY   AURYXIA 1 GM 210 MG(Fe) tablet Take 210 mg by mouth every evening.   calcitRIOL (ROCALTROL) 0.25 MCG capsule Take 0.25 mcg by  mouth daily.   Cetirizine HCl 10 MG CAPS Take 1 capsule by mouth daily.    Cholecalciferol (D3-1000 PO) Take 1 tablet by mouth See admin instructions. Take one tablet by mouth on Monday,Wednesday and Fridays   clonazePAM (KLONOPIN) 0.5 MG tablet Take 2 tablets in morning on Monday, Wednesday, Friday before dialysis   escitalopram (LEXAPRO) 10 MG tablet Take 1 tablet (10 mg total) by mouth daily.   ferric citrate (AURYXIA) 1 GM 210 MG(Fe) tablet Take 1 tablet by mouth 3 times a day with meals as directed   fluticasone (FLONASE) 50 MCG/ACT nasal spray Place 1 spray into both nostrils as needed for allergies.   fluticasone furoate-vilanterol (BREO ELLIPTA) 200-25 MCG/ACT AEPB Inhale 1 puff by mouth into the lungs at the same time everyday   fluticasone furoate-vilanterol (BREO ELLIPTA) 200-25 MCG/INH AEPB INHALE 1 PUFF BY MOUTH INTO THE LUNGS AT THE SAME TIME EVERY DAY   FREESTYLE LITE test strip    Glucagon, rDNA, (GLUCAGON EMERGENCY) 1 MG KIT See admin instructions.   glucose blood (FREESTYLE LITE) test strip Use as directed to test blood glucose levels 3 times daily.   hydrOXYzine (ATARAX) 25 MG tablet Take 1 tablet (25 mg total) by mouth as needed.   insulin glargine-yfgn (SEMGLEE, YFGN,) 100 UNIT/ML Pen Inject 6 units under the skin once daily (discard pen after 28 days from first use)   Insulin Glargine-yfgn (SEMGLEE, YFGN,) 100 UNIT/ML SOPN Inject 6 units subcutaneously once a day   Insulin Lispro (HUMALOG KWIKPEN [place]) Inject 2-4 Units into  the skin 3 (three) times daily before meals. Sliding scale: 150-250 =2 units, 250 -350=3 units, 350 += 4 units   Insulin Pen Needle 32G X 4 MM MISC USE AS DIRECTED 4 TO 5 TIMES DAILY TO INJECT INSULIN   lacosamide (VIMPAT) 200 MG TABS tablet TAKE 1 TABLET TWICE A DAY EXCEPT ON DIALYSIS DAYS M-W-F GIVE 1 TAB IN AM, 1 TAB AFTER DIALYSIS, 1 TAB IN PM.   lamoTRIgine (LAMICTAL) 200 MG tablet TAKE 1 TABLET BY MOUTH 2 TIMES DAILY   Lancets (FREESTYLE) lancets     Lancets (FREESTYLE) lancets Use as directed to test blood glucose levels three times daily.   Methoxy PEG-Epoetin Beta (MIRCERA IJ)    metoprolol succinate (TOPROL-XL) 25 MG 24 hr tablet Take 25 mg by mouth 2 (two) times daily.   midodrine (PROAMATINE) 10 MG tablet Take 1 tablet (10 mg total) by mouth 2 (two) times daily.   multivitamin (RENA-VIT) TABS tablet Take 1 tablet by mouth at bedtime.   pantoprazole (PROTONIX) 40 MG tablet TAKE 1 TABLET BY MOUTH ONCE DAILY   prochlorperazine (COMPAZINE) 10 MG tablet Take 10 mg by mouth every 6 (six) hours as needed for nausea.   prochlorperazine (COMPAZINE) 10 MG tablet TAKE 1 TABLET BY MOUTH EVERY 6 HOURS AS NEEDED FOR NAUSEA OR VOMITING.   QUEtiapine (SEROQUEL) 25 MG tablet Take 25 mg by mouth as needed (relalaxion).   UNIFINE PENTIPS 31G X 8 MM MISC USE AS DIRECTED DAILY AFTER SUPPER   No facility-administered encounter medications on file as of 10/25/2021.    Patient Active Problem List   Diagnosis Date Noted   Chest pain 08/06/2021   Olfactory hallucinations 01/26/2021   Dependence on renal dialysis ([place]) 01/26/2021   Colitis 03/15/2020   AMS (altered mental status) 82/95/6213   Acute metabolic encephalopathy 08/65/7846   Headache, unspecified 09/28/2019   Abnormality of gait 09/13/2019   Diabetes mellitus with end-stage renal disease ([place]) 08/07/2019   Benign hypertensive kidney disease with chronic kidney disease 08/07/2019   Hypothyroidism, unspecified 07/15/2019   Spastic hemiparesis ([place]) 07/12/2019   Dysphagia, oropharyngeal phase 07/05/2019   Allergy, unspecified, initial encounter 06/27/2019   PAF (paroxysmal atrial fibrillation) ([place])    Bacteremia    Labile blood pressure    Labile blood glucose    Controlled type 2 diabetes mellitus with hyperglycemia, without long-term current use of insulin ([place])    ESRD on dialysis ([place])    Right sided weakness    SDH (subdural hematoma)    Chronic intracranial subdural hematoma ([place])  06/09/2019   Multiple myeloma not having achieved remission ([place]) 06/09/2019   ESRD on hemodialysis ([place]) 06/09/2019   Essential hypertension 06/09/2019   DM type 2 (diabetes mellitus, type 2) ([place]) 06/09/2019   Seizure disorder ([place]) 06/09/2019   Aspiration pneumonia ([place]) 06/09/2019   History of bacteremia 06/09/2019   Atrial fibrillation, chronic ([place]) 06/09/2019   Bilateral kidney stones 05/31/2019   Proteus infection 05/28/2019   History of restrictive pulmonary disease 04/07/2019   Multiple myeloma ([place]) 05/17/2018   Goals of care, counseling/discussion 05/17/2018   Partial idiopathic epilepsy with seizures of localized onset, intractable, without status epilepticus ([place]) 11/02/2017   Iron deficiency anemia, unspecified 01/22/2017   COPD (chronic obstructive pulmonary disease) ([place]) 11/11/2016   CVA (cerebral vascular accident) ([place]) 11/11/2016   GERD (gastroesophageal reflux disease) 11/11/2016   GI bleed 11/11/2016   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   Obesity (BMI 30-39.9) 11/11/2016   Pulmonary hypertension ([place]) 11/11/2016  Patent foramen ovale 11/11/2016   Hyperparathyroidism, secondary renal ([place]) 11/11/2016   VTE (venous thromboembolism) 11/11/2016   Hypotension 11/11/2016   Systemic hypertension 11/11/2016   Seizure ([place]) 11/11/2016   Other seizures ([place]) 07/25/2016   Encounter for removal of sutures 05/27/2016   Ingrown nail 03/03/2016   Onychomycosis due to dermatophyte 03/03/2016   Other acquired hammer toe 03/03/2016   Hemiparesis affecting right side as late effect of cerebrovascular accident ([place]) 92/92/4462   Aphasia complicating stroke 86/38/1771   Seizures ([place]) 01/08/2016   History of ischemic left MCA stroke 01/08/2016   Right sided weakness 01/08/2016   Atrial fibrillation ([place]) 01/08/2016   Leukocytosis 01/08/2016   Benign essential HTN 01/08/2016   Anemia of chronic disease 01/08/2016   Controlled type 2 diabetes mellitus with  diabetic nephropathy, without long-term current use of insulin ([place]) 01/08/2016   History of DVT (deep vein thrombosis) 01/08/2016   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   Hypocalcemia 12/26/2015   Long term (current) use of anticoagulants 11/07/2015   Aftercare including intermittent dialysis ([place]) 11/02/2015   Other specified coagulation defects ([place]) 16/57/9038   Complication of vascular dialysis catheter 11/02/2015   Diarrhea, unspecified 11/02/2015   Fever, unspecified 11/02/2015   Pruritus, unspecified 11/02/2015   Shortness of breath 11/02/2015   ESRD (end stage renal disease) on dialysis ([place])     Conditions to be addressed/monitored: DM with ESRD, Multiple Myeloma, Vascular dementia with behavior disturbances  Care Plan : RN Care Manager Plan of Care  Updates made by Lynne Logan, RN since 10/25/2021 12:00 AM     Problem: No plan of care established for management of chronic disease states (DM with ESRD, Multiple Myeloma, Vascular dementia with behavior disturbances)   Priority: High     Long-Range Goal: Development of plan of care for chronic disease states (DM with ESRD, Multiple Myeloma, Vascular dementia with behavior disturbances)   Start Date: 08/21/2021  Expected End Date: 08/21/2022  Recent Progress: On track  Priority: High  Note:   Current Barriers:  Knowledge Deficits related to plan of care for management of DM with ESRD, Multiple Myeloma, Vascular dementia with behavior disturbances  Chronic Disease Management support and education needs related to DM with ESRD, Multiple Myeloma, Vascular dementia with behavior disturbances  Cognitive deficits with behavior changes   RNCM Clinical Goal(s):  Patient will continue to work with RN Care Manager to address care management and care coordination needs related to  DM with ESRD, Multiple Myeoloma as evidenced by adherence to CM Team Scheduled appointments through collaboration with RN Care manager, provider, and  care team.   Interventions: 1:1 collaboration with primary care provider regarding development and update of comprehensive plan of care as evidenced by provider attestation and co-signature Inter-disciplinary care team collaboration (see longitudinal plan of care) Evaluation of current treatment plan related to  self management and patient's adherence to plan as established by provider Completed RN CM follow up with wife and caregiver [name]   Oncology:  (Status: Goal on track:  Yes.) Long Term Goal Completed successful outbound call [name] Assessment of understanding of oncology diagnosis Assessed wife's understanding of patient's cancer diagnosis and recommended treatment plan Reviewed upcoming provider appointments and treatment appointments Assessed available transportation to appointments and treatments. Has consistent/reliable transportation: Yes Assessed support system. Has consistent/reliable family or other support: Yes, and Nutrition assessment performed Discussed plans with patient for ongoing care management follow up and provided patient with direct contact information for care management team  Vascular dementia  with behavior disturbances:  (Status:  Goal on track:  Yes.)  Long Term Goal Evaluation of current treatment plan related to Dementia, self-management and patient's adherence to plan as established by provider Review of patient status, including review of consultant's reports, relevant laboratory and other test results, and medications completed. Reviewed medications with patient and discussed importance of medication adherence Reviewed scheduled/upcoming provider appointments, wife will provide transportation and accompany patient  Re-educated wife regarding Palliative Care and encouraged her to notify PCP if she would like to move forward Discussed plans with patient for ongoing care management follow up and provided patient with direct contact information for care  management team  Patient Goals/Self-Care Activities: Take all medications as prescribed Attend all scheduled provider appointments Call pharmacy for medication refills 3-7 days in advance of running out of medications Call provider office for new concerns or questions   Follow Up Plan:  Telephone follow up appointment with care management team member scheduled for:  11/15/21     Barb Merino, RN, BSN, CCM Care Management Coordinator [facility]  Direct Phone: [phone]

## 2021-10-28 ENCOUNTER — Other Ambulatory Visit (HOSPITAL_COMMUNITY): Payer: Self-pay

## 2021-10-28 DIAGNOSIS — Z23 Encounter for immunization: Secondary | ICD-10-CM | POA: Diagnosis not present

## 2021-10-28 DIAGNOSIS — N186 End stage renal disease: Secondary | ICD-10-CM | POA: Diagnosis not present

## 2021-10-28 DIAGNOSIS — D631 Anemia in chronic kidney disease: Secondary | ICD-10-CM | POA: Diagnosis not present

## 2021-10-28 DIAGNOSIS — N2581 Secondary hyperparathyroidism of renal origin: Secondary | ICD-10-CM | POA: Diagnosis not present

## 2021-10-28 DIAGNOSIS — Z992 Dependence on renal dialysis: Secondary | ICD-10-CM | POA: Diagnosis not present

## 2021-10-30 DIAGNOSIS — N186 End stage renal disease: Secondary | ICD-10-CM | POA: Diagnosis not present

## 2021-10-30 DIAGNOSIS — N2581 Secondary hyperparathyroidism of renal origin: Secondary | ICD-10-CM | POA: Diagnosis not present

## 2021-10-30 DIAGNOSIS — Z23 Encounter for immunization: Secondary | ICD-10-CM | POA: Diagnosis not present

## 2021-10-30 DIAGNOSIS — Z992 Dependence on renal dialysis: Secondary | ICD-10-CM | POA: Diagnosis not present

## 2021-10-30 DIAGNOSIS — D631 Anemia in chronic kidney disease: Secondary | ICD-10-CM | POA: Diagnosis not present

## 2021-11-01 DIAGNOSIS — N186 End stage renal disease: Secondary | ICD-10-CM | POA: Diagnosis not present

## 2021-11-01 DIAGNOSIS — D631 Anemia in chronic kidney disease: Secondary | ICD-10-CM | POA: Diagnosis not present

## 2021-11-01 DIAGNOSIS — Z23 Encounter for immunization: Secondary | ICD-10-CM | POA: Diagnosis not present

## 2021-11-01 DIAGNOSIS — N2581 Secondary hyperparathyroidism of renal origin: Secondary | ICD-10-CM | POA: Diagnosis not present

## 2021-11-01 DIAGNOSIS — Z992 Dependence on renal dialysis: Secondary | ICD-10-CM | POA: Diagnosis not present

## 2021-11-04 ENCOUNTER — Other Ambulatory Visit (HOSPITAL_COMMUNITY): Payer: Self-pay

## 2021-11-04 ENCOUNTER — Other Ambulatory Visit: Payer: Self-pay

## 2021-11-04 DIAGNOSIS — Z23 Encounter for immunization: Secondary | ICD-10-CM | POA: Diagnosis not present

## 2021-11-04 DIAGNOSIS — N2581 Secondary hyperparathyroidism of renal origin: Secondary | ICD-10-CM | POA: Diagnosis not present

## 2021-11-04 DIAGNOSIS — Z992 Dependence on renal dialysis: Secondary | ICD-10-CM | POA: Diagnosis not present

## 2021-11-04 DIAGNOSIS — D631 Anemia in chronic kidney disease: Secondary | ICD-10-CM | POA: Diagnosis not present

## 2021-11-04 DIAGNOSIS — N186 End stage renal disease: Secondary | ICD-10-CM | POA: Diagnosis not present

## 2021-11-04 MED ORDER — ESCITALOPRAM OXALATE 10 MG PO TABS
10.0000 mg | ORAL_TABLET | Freq: Every day | ORAL | 3 refills | Status: AC
  Filled 2021-11-04: qty 90, 90d supply, fill #0
  Filled 2022-01-10 – 2022-01-13 (×2): qty 90, 90d supply, fill #1
  Filled 2022-03-02 – 2022-04-20 (×3): qty 90, 90d supply, fill #2

## 2021-11-04 MED ORDER — LACOSAMIDE 200 MG PO TABS
ORAL_TABLET | ORAL | 3 refills | Status: DC
  Filled 2021-11-04: qty 216, 90d supply, fill #0

## 2021-11-05 ENCOUNTER — Other Ambulatory Visit: Payer: Self-pay

## 2021-11-05 ENCOUNTER — Encounter: Payer: Self-pay | Admitting: Psychology

## 2021-11-05 ENCOUNTER — Ambulatory Visit: Payer: 59 | Admitting: Psychology

## 2021-11-05 ENCOUNTER — Ambulatory Visit (INDEPENDENT_AMBULATORY_CARE_PROVIDER_SITE_OTHER): Payer: Medicare Other | Admitting: Psychology

## 2021-11-05 DIAGNOSIS — E1122 Type 2 diabetes mellitus with diabetic chronic kidney disease: Secondary | ICD-10-CM

## 2021-11-05 DIAGNOSIS — R4189 Other symptoms and signs involving cognitive functions and awareness: Secondary | ICD-10-CM

## 2021-11-05 DIAGNOSIS — N186 End stage renal disease: Secondary | ICD-10-CM | POA: Diagnosis not present

## 2021-11-05 DIAGNOSIS — I639 Cerebral infarction, unspecified: Secondary | ICD-10-CM | POA: Diagnosis not present

## 2021-11-05 DIAGNOSIS — F22 Delusional disorders: Secondary | ICD-10-CM | POA: Diagnosis not present

## 2021-11-05 DIAGNOSIS — F01518 Vascular dementia, unspecified severity, with other behavioral disturbance: Secondary | ICD-10-CM

## 2021-11-05 DIAGNOSIS — D5740 Sickle-cell thalassemia without crisis: Secondary | ICD-10-CM | POA: Insufficient documentation

## 2021-11-05 DIAGNOSIS — E11319 Type 2 diabetes mellitus with unspecified diabetic retinopathy without macular edema: Secondary | ICD-10-CM | POA: Insufficient documentation

## 2021-11-05 DIAGNOSIS — G40019 Localization-related (focal) (partial) idiopathic epilepsy and epileptic syndromes with seizures of localized onset, intractable, without status epilepticus: Secondary | ICD-10-CM

## 2021-11-05 HISTORY — DX: Vascular dementia, unspecified severity, with other behavioral disturbance: F01.518

## 2021-11-05 NOTE — Progress Notes (Signed)
° °  Psychometrician Note   Cognitive testing was administered to [facility] by Milana Kidney, B.S. (psychometrist) under the supervision of Dr. Christia Reading, Ph.D., licensed psychologist on 11/05/21. [name] did not appear overtly distressed by the testing session per behavioral observation or responses across self-report questionnaires. Rest breaks were offered.    The battery of tests administered was selected by Dr. Christia Reading, Ph.D. with consideration to Mr. Finigan current level of functioning, the nature of his symptoms, emotional and behavioral responses during interview, level of literacy, observed level of motivation/effort, and the nature of the referral question. This battery was communicated to the psychometrist. Communication between Dr. Christia Reading, Ph.D. and the psychometrist was ongoing throughout the evaluation and Dr. Christia Reading, Ph.D. was immediately accessible at all times. Dr. Christia Reading, Ph.D. provided supervision to the psychometrist on the date of this service to the extent necessary to assure the quality of all services provided.    [facility] will return within approximately 1-2 weeks for an interactive feedback session with Dr. Melvyn Novas at which time his test performances, clinical impressions, and treatment recommendations will be reviewed in detail. Mr. Sovine understands he can contact our office should he require our assistance before this time.  A total of 120 minutes of billable time were spent face-to-face with [name] by the psychometrist. This includes both test administration and scoring time. Billing for these services is reflected in the clinical report generated by Dr. Christia Reading, Ph.D.  This note reflects time spent with the psychometrician and does not include test scores or any clinical interpretations made by Dr. Melvyn Novas. The full report will follow in a separate note.

## 2021-11-05 NOTE — Progress Notes (Signed)
NEUROPSYCHOLOGICAL EVALUATION [place] [place] Department of Neurology  Date of Evaluation: November 05, 2021  Reason for Referral:   Ivy Meriwether [facility] is a 56 y.o. mixed-handed (predominantly right but able to use left, especially since past his prior strokes) African-American male referred by Ellouise Newer, M.D., to characterize his current cognitive functioning and assist with diagnostic clarity and treatment planning in the context of a history of numerous strokes, ongoing seizure activity, and cognitive decline.   Assessment and Plan:   Clinical Impression(s): Mr. Whiters' pattern of performance is suggestive of diffuse and quite severe cognitive impairment, impacting all neurocognitive domains which were able to be assessed. This included processing speed, complex attention, executive functioning, receptive and expressive language, nonverbal abstract reasoning, and verbal learning and memory. While basic attention represented a relative strength, his performance (below average range) is still likely a notable decline given his academic achievement and prior vocational positions. Regarding activities of daily living (ADLs), his wife manages all medication, financial, and bill paying responsibilities. He also does not drive due to a combination of physical and cognitive manifestations of his past strokes. Given ongoing cognitive and functional impairment, he meets criteria for a Major Neurocognitive Disorder ("dementia") at the present time.  Regarding etiology, Mr. Dannemiller should be viewed as having a primary vascular etiology (i.e., a vascular dementia presentation). Most recent neuroimaging revealed numerous prior strokes involving nearly every area of the brain to some extent. Specific areas with identified encephalomalacia and gliosis included the right parietoccipital lobe, right frontal lobe, left temporal lobe, left occipital lobe, and left frontoparietal and insular  regions. Remote lacunar infarctions were also seen in the left thalamus and bilateral cerebellar hemispheres. Given the sheer number of stroke locations and widespread nature of these events, significant and quite diffuse impairment is certainly a reasonable expectation and finding across testing. Reported personality changes and behavioral disturbances could certainly be directly related to strokes involving the frontal lobes, especially on the right side. Bilateral occipital lobe involvement would also directly compromise ongoing visual acuity and visual processing in general. While his seizure history would worsen his clinical presentation, seizure activity is most likely caused by his extensive stroke history.  Given the diffuse nature of severe impairment, there are no patterns across testing which can be interpreted with regard to an additional underlying neurodegenerative illness. Visual hallucinations and tremors could be a risk factor for Lewy body dementia; however, these symptoms can certainly be explained by strokes affecting the frontal lobes, motor cortices, and other brain regions. His stroke history may also better explain personality changes versus something like frontotemporal dementia. Based on population base rates, Alzheimer's disease at 58 years old would be extremely rare, especially without a notable family history. While he reported moderate anxiety, that would certainly not account for the extent of ongoing cognitive impairment. Continued medical monitoring will be important moving forward.  Recommendations: If not already attempted, he could discuss outpatient speech therapy and occupational therapy referrals with his PCP to assist with dysarthria, fine motor weakness/dysfunction, and to develop strategies for improving his day-to-day functional capacities.   Should hallucinations and other behavioral disturbances increase, he and his wife should speak to his PCP regarding  medication options to better manage these symptoms.   A combination of medication and psychotherapy has been shown to be most effective at treating symptoms of anxiety and depression. As such, Mr. Damiano is encouraged to speak with his prescribing physician regarding medication adjustments to optimally manage these symptoms.  Likewise, Mr. Eisenbeis is encouraged to consider engaging in short-term psychotherapy to address symptoms of psychiatric distress. He would benefit from an active and collaborative therapeutic environment, rather than one purely supportive in nature. Recommended treatment modalities include Cognitive Behavioral Therapy (CBT) or Acceptance and Commitment Therapy (ACT).  I agree with Mr. Schweer fully abstaining from driving pursuits based upon the results of the current evaluation.   He will likely benefit from the establishment and maintenance of a routine in order to maximize his functional abilities over time. In all likelihood, his wife will need to continue full management of medication, financial, and bill paying responsibilities moving forward.   It will be important for Mr. Aloisi to have another person with him when in situations where he may need to process information, weigh the pros and cons of different options, and make decisions, in order to ensure that he fully understands and recalls all information to be considered.  If not already done, Mr. Bruna and his family may want to discuss his wishes regarding durable power of attorney and medical decision making, so that he can have input into these choices. Additionally, they may wish to discuss future plans for caretaking and seek out community options for in home/residential care should they become necessary.  Mr. Mckowen is encouraged to attend to lifestyle factors for brain health (e.g., regular physical exercise, good nutrition habits, regular participation in cognitively-stimulating activities, and general stress management  techniques), which are likely to have benefits for both emotional adjustment and cognition. In fact, in addition to promoting good general health, regular exercise incorporating aerobic activities (e.g., brisk walking, jogging, cycling, etc.) has been demonstrated to be a very effective treatment for depression and stress, with similar efficacy rates to both antidepressant medication and psychotherapy. Optimal control of vascular risk factors (including safe cardiovascular exercise and adherence to dietary recommendations) is encouraged. Continued participation in activities which provide mental stimulation and social interaction is also recommended.   Important information should be provided to [name] in written format in all instances. This information should be placed in a highly frequented and easily visible location within his home to promote recall. External strategies such as written notes in a consistently used memory journal, visual and nonverbal auditory cues such as a calendar on the refrigerator or appointments with alarm, such as on a cell phone, can also help maximize recall.  To address problems with processing speed, he may wish to consider:   -Ensuring that he is alerted when essential material or instructions are being presented   -Adjusting the speed at which new information is presented  -Allowing for more time in comprehending, processing, and responding in conversation  To address problems with fluctuating attention, he may wish to consider:   -Avoiding external distractions when needing to concentrate   -Limiting exposure to fast paced environments with multiple sensory demands   -Writing down complicated information and using checklists   -Attempting and completing one task at a time (i.e., no multi-tasking)   -Verbalizing aloud each step of a task to maintain focus   -Taking frequent breaks during the completion of steps/tasks to avoid fatigue   -Reducing the amount of  information considered at one time  Review of Records:   Mr. Depoy was seen by [facility] Marland KitchenEllouise Newer, M.D.) on 10/29/2017 following a 2017 stroke and emergence of seizure activity. Stroke workup showed left M2 superior branch occlusion, TEE with PFO, hypercoagulation studies negative. He made significant improvement with right-sided weakness and  speech was more fluent. He had a seizure in April 2017 and was started on Vimpat 127m BID. EEG reported diffuse slowing. Limited brain MRI showed multiple late subacute and chronic infarcts, and repeat imaging in May 2017 showed resolution with no new infarct. [place] and was found to be anemic with Hgb of 6.8, requiring multiple blood transfusions. Vimpat increased to 1560mBID. EEG showed mild intermittent generalized theta slowing. He had another seizure after dialysis in November 2017 and Dilantin 30022maily was added. His wife did not want him to be on this long term and he was switched to Lamotrigine. He had another seizure in January 2018 a few days after Dilantin was tapered off. He had another seizure in October 2018 again after dialysis. The last seizure was on 10/19/2017 again after dialysis. This was slightly different because he was belligerent and agitated and his wife had to calm him down. He sometimes has a warning prior to the seizures. With the most recent seizure, he was standing at the kitchen table, recalled his wife picking him up, then coming to on the sofa. Seizures last 5-10 minutes, his wife describes staring off, unresponsive, then trembling/low amplitude shaking. He is tired after, with worsened right-sided weakness. He would be "pretty out of it" the next day. The majority of seizures occur right after dialysis where he would still be sitting in the dialysis unit. He has occasional tremors in both legs. He noticed memory changes after his stroke but does not think  memory is worse after each seizure. He denied any olfactory/gustatory hallucinations, deja vu, rising epigastric sensation, or myoclonic jerks.   Mr. HilBerlingers most recently seen by Dr. AquDelice Lesch 10/08/2021 for follow-up. He experienced a right subdural hematoma in 2020 and reported an additional stroke occurring in 2021. Ongoing vascular cognitive impairment was said to have associated behavioral changes (i.e., paranoid delusions surrounding his wife cheating on him). He had a left temporal seizure during his EMU admission in April 2022 when seizure medications were tapered. He continues on Vimpat 200m66mD and Lamotrigine 200mg25m without side effects. Performance on a brief cognitive screening instrument (MOCA) was 17/30. Ultimately, Mr. HillsNuszreferred for a comprehensive neuropsychological evaluation to characterize his cognitive abilities and to assist with diagnostic clarity and treatment planning.   Brain MRI on 03/25/2021 revealed areas of encephalomalacia and gliosis in the right parietoccipital lobe, right frontal lobe, left temporal lobe, left occipital lobe, and left frontoparietal and insular regions. Remote lacunar infarctions are also seen in the left thalamus and bilateral cerebellar hemispheres. Moderate parenchymal volume loss was noted, as well as a small chronic right cerebral convexity fluid collection measuring 5 mm without mass effect.   Past Medical History:  Diagnosis Date   Abnormality of gait 09/506/14/4818ute metabolic encephalopathy 09/2654/31/4970lergy 06/27/2019   AMS (altered mental status) 10/05/2019   Anaphylactic shock, unspecified 07/30/2021   Aphasia due to acute cerebrovascular accident 02/28/2016   Aspiration pneumonia 06/09/2019   Bacteremia    Bilateral kidney stones 05/31/2019   Chronic intracranial subdural hematoma 06/09/2019   COPD (chronic obstructive pulmonary disease) 11/11/2016   CVA (cerebral vascular accident)    2017 and 2021; R-MCA, L-MCA,  PCA and bilateral cerebellar complicated by DVT/PE   Delusional thoughts, secondary to stroke 2021   Diabetic retinopathy    PDR OU   Dysphagia, oropharyngeal phase 07/05/2019   ESRD (end stage renal  disease) on dialysis    Essential hypertension 06/09/2019   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   GERD (gastroesophageal reflux disease) 11/11/2016   GIB (gastrointestinal bleeding)    Recurrent episodes- 09/2014, 09/2015 and 05/2016   Gout    Hammer toe, acquired 03/03/2016   Headache 09/28/2019   Hemiparesis affecting right side as late effect of cerebrovascular accident 02/28/2016   History of DVT (deep vein thrombosis) 01/08/2016   History of echocardiogram 08/2021   mod septal LVH o/w mild conc LVH, EF 55-60, no RWMA, Gr 2 DD, normal RVSF, severe LAE, trivial MR   History of ischemic left MCA stroke 01/08/2016   History of recent blood transfusion 10/27/2014   2 Units PRBC's   Hyperkalemia 07/2011   Hyperparathyroidism, secondary renal 11/11/2016   Hypocalcemia 12/26/2015   Hypotension 11/11/2016   Hypothyroidism 07/15/2019   Iron deficiency anemia, unspecified 01/22/2017   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   Labile blood glucose    Labile blood pressure    Leukocytosis 01/08/2016   Multiple myeloma 05/17/2018   Onychomycosis due to dermatophyte 03/03/2016   Other specified coagulation defects 11/02/2015   PAF (paroxysmal atrial fibrillation)    Partial idiopathic epilepsy with seizures of localized onset, intractable, without status epilepticus 11/02/2017   Patent foramen ovale 11/11/2016   Proteus infection 05/28/2019   Pruritus, unspecified 11/02/2015   Pulmonary hypertension 11/11/2016    By echo 09/2014  RHC on 04/21/19 for further investigation of the nature of his elevated RVSP estimates on non-invasive imaging. His RHC (performed on a Thursday following HD on a Wednesday) demonstrated:  RA 10, RV 67/9, mPA 38, PCWP 19, CO 9.6, CI 4.5, PVR 2.0.   These  findings are consistent with mild elevation in intracardiac filling pressures in the setting of high cardiac output in the setti   Right sided weakness 01/08/2016   Sepsis    Shortness of breath 11/02/2015   Sickle cell-thalassemia disease    a. Sickle cell trait.   Spastic hemiparesis 07/12/2019   Type 2 diabetes mellitus with hyperglycemia, without long-term current use of insulin    Ulcerative colitis 03/15/2020   VTE (venous thromboembolism) 11/11/2016   DVT + PE    Past Surgical History:  Procedure Laterality Date   AV FISTULA PLACEMENT Right 12/05/2015   Procedure: INSERTION OF ARTERIOVENOUS (AV) GORE-TEX GRAFT ARM;  Surgeon: Elam Dutch, MD;  Location: [facility];  Service: Vascular;  Laterality: Right;   BONE MARROW BIOPSY     CATARACT EXTRACTION Right 09/2019   Dr. Herbert Deaner   CATARACT EXTRACTION Left 10/2019   Dr. Herbert Deaner   CHOLECYSTECTOMY     CHOLECYSTECTOMY  [phone]?   COLONOSCOPY WITH PROPOFOL N/A 01/22/2017   Procedure: COLONOSCOPY WITH PROPOFOL;  Surgeon: Wilford Corner, MD;  Location: [facility];  Service: Endoscopy;  Laterality: N/A;   EYE SURGERY Bilateral    Cat Sx - Dr. Herbert Deaner   INSERTION OF DIALYSIS CATHETER Right 10/16/2015   Procedure: INSERTION OF PALINDROME DIALYSIS CATHETER ;  Surgeon: Elam Dutch, MD;  Location: [facility];  Service: Vascular;  Laterality: Right;   OTHER SURGICAL HISTORY     Retinal surgery   PARS PLANA VITRECTOMY  02/17/2012   Procedure: PARS PLANA VITRECTOMY WITH 25 GAUGE;  Surgeon: Hayden Pedro, MD;  Location: [facility];  Service: Ophthalmology;  Laterality: Right;   PERIPHERAL VASCULAR CATHETERIZATION N/A 03/20/2016   Procedure: A/V Shuntogram/Fistulagram;  Surgeon: Conrad [place], MD;  Location: [facility];  Service: Cardiovascular;  Laterality: N/A;   TEE WITHOUT CARDIOVERSION N/A 12/02/2016   Procedure: TRANSESOPHAGEAL ECHOCARDIOGRAM (TEE);  Surgeon: Larey Dresser, MD;  Location: [facility] ENDOSCOPY;  Service: Cardiovascular;   Laterality: N/A;    Current Outpatient Medications:    acetaminophen (TYLENOL) 325 MG tablet, Take 1-2 tablets (325-650 mg total) by mouth every 4 (four) hours as needed for mild pain., Disp:  , Rfl:    albuterol (PROAIR HFA) 108 (90 Base) MCG/ACT inhaler, Inhale two puffs every 4-6 hours if needed for cough or wheeze, Disp: 6.7 g, Rfl: 2   Alcohol Swabs (SM ALCOHOL PREP) 70 % PADS, , Disp: , Rfl:    atorvastatin (LIPITOR) 20 MG tablet, TAKE 1 TABLET BY MOUTH ONCE A DAY, Disp: 90 tablet, Rfl: 2   AURYXIA 1 GM 210 MG(Fe) tablet, Take 210 mg by mouth every evening., Disp: , Rfl:    calcitRIOL (ROCALTROL) 0.25 MCG capsule, Take 0.25 mcg by mouth daily., Disp: , Rfl:    Cetirizine HCl 10 MG CAPS, Take 1 capsule by mouth daily. , Disp: , Rfl:    Cholecalciferol (D3-1000 PO), Take 1 tablet by mouth See admin instructions. Take one tablet by mouth on Monday,Wednesday and Fridays, Disp: , Rfl:    clonazePAM (KLONOPIN) 0.5 MG tablet, Take 2 tablets in morning on Monday, Wednesday, Friday before dialysis, Disp: 60 tablet, Rfl: 5   escitalopram (LEXAPRO) 10 MG tablet, Take 1 tablet (10 mg total) by mouth daily., Disp: 90 tablet, Rfl: 3   ferric citrate (AURYXIA) 1 GM 210 MG(Fe) tablet, Take 1 tablet by mouth 3 times a day with meals as directed, Disp: 270 tablet, Rfl: 3   fluticasone (FLONASE) 50 MCG/ACT nasal spray, Place 1 spray into both nostrils as needed for allergies., Disp: , Rfl:    fluticasone furoate-vilanterol (BREO ELLIPTA) 200-25 MCG/ACT AEPB, Inhale 1 puff by mouth into the lungs at the same time everyday, Disp: 60 each, Rfl: 1   fluticasone furoate-vilanterol (BREO ELLIPTA) 200-25 MCG/INH AEPB, INHALE 1 PUFF BY MOUTH INTO THE LUNGS AT THE SAME TIME EVERY DAY, Disp: 60 each, Rfl: 2   FREESTYLE LITE test strip, , Disp: , Rfl:    Glucagon, rDNA, (GLUCAGON EMERGENCY) 1 MG KIT, See admin instructions., Disp: , Rfl:    glucose blood (FREESTYLE LITE) test strip, Use as directed to test blood glucose  levels 3 times daily., Disp: 300 each, Rfl: 3   hydrOXYzine (ATARAX) 25 MG tablet, Take 1 tablet (25 mg total) by mouth as needed., Disp: 30 tablet, Rfl: 1   insulin glargine-yfgn (SEMGLEE, YFGN,) 100 UNIT/ML Pen, Inject 6 units under the skin once daily (discard pen after 28 days from first use), Disp: 3 mL, Rfl: 0   Insulin Glargine-yfgn (SEMGLEE, YFGN,) 100 UNIT/ML SOPN, Inject 6 units subcutaneously once a day, Disp: 9 mL, Rfl: 4   Insulin Lispro (HUMALOG KWIKPEN [place]), Inject 2-4 Units into the skin 3 (three) times daily before meals. Sliding scale: 150-250 =2 units, 250 -350=3 units, 350 += 4 units, Disp: , Rfl:    Insulin Pen Needle 32G X 4 MM MISC, USE AS DIRECTED 4 TO 5 TIMES DAILY TO INJECT INSULIN, Disp: 400 each, Rfl: 6   lacosamide (VIMPAT) 200 MG TABS tablet, TAKE 1 TABLET TWICE A DAY EXCEPT ON DIALYSIS DAYS M-W-F GIVE 1 TAB IN AM, 1 TAB AFTER DIALYSIS, 1 TAB IN PM., Disp: 216 tablet, Rfl: 3   lamoTRIgine (LAMICTAL) 200 MG tablet, TAKE 1 TABLET BY MOUTH 2 TIMES DAILY, Disp: 180 tablet,  Rfl: 3   Lancets (FREESTYLE) lancets, , Disp: , Rfl:    Lancets (FREESTYLE) lancets, Use as directed to test blood glucose levels three times daily., Disp: 300 each, Rfl: 4   Methoxy PEG-Epoetin Beta (MIRCERA IJ), , Disp: , Rfl:    metoprolol succinate (TOPROL-XL) 25 MG 24 hr tablet, Take 25 mg by mouth 2 (two) times daily., Disp: , Rfl:    midodrine (PROAMATINE) 10 MG tablet, Take 1 tablet (10 mg total) by mouth 2 (two) times daily., Disp: 180 tablet, Rfl: 1   multivitamin (RENA-VIT) TABS tablet, Take 1 tablet by mouth at bedtime., Disp: , Rfl:    pantoprazole (PROTONIX) 40 MG tablet, TAKE 1 TABLET BY MOUTH ONCE DAILY, Disp: 90 tablet, Rfl: 1   prochlorperazine (COMPAZINE) 10 MG tablet, Take 10 mg by mouth every 6 (six) hours as needed for nausea., Disp: , Rfl:    prochlorperazine (COMPAZINE) 10 MG tablet, TAKE 1 TABLET BY MOUTH EVERY 6 HOURS AS NEEDED FOR NAUSEA OR VOMITING., Disp: 30 tablet, Rfl: 0    QUEtiapine (SEROQUEL) 25 MG tablet, Take 25 mg by mouth as needed (relalaxion)., Disp: , Rfl:    UNIFINE PENTIPS 31G X 8 MM MISC, USE AS DIRECTED DAILY AFTER SUPPER, Disp: 100 each, Rfl: 3  Clinical Interview:   The following information was obtained during a clinical interview with Mr. Pandya and his wife prior to cognitive testing.  Cognitive Symptoms: Decreased short-term memory: Endorsed. When asked, Mr. Sheridan described "sometimes" observing some forgetfulness or other short-term memory concerns "when rushing." His wife expressed more pronounced concerns, noting that Mr. Lackey will repeat himself and has trouble recalling details of past conversations. Decreased long-term memory: Denied. Decreased attention/concentration: Denied. Reduced processing speed: Endorsed. Difficulties with executive functions: Denied. His wife has expressed concerns surrounding personality changes and some paranoia/delusional thinking (see below), primarily since his 2021 stroke.  Difficulties with emotion regulation: Denied. Difficulties with receptive language: Denied. Difficulties with word finding: Endorsed. He noted increased stuttering, word finding difficulties, and dysarthric speech since his 2017 and 2021 strokes.  Decreased visuoperceptual ability: Denied.  Trajectory of deficits: Mr. Dapolito and his wife generally denied significant cognitive dysfunction following his initial 2017 stroke. Cognitive decline was noteworthy following his 2021 stroke. Per Mr. Adames, cognitive abilities have seemed to gradually diminish over time since 2021.   Difficulties completing ADLs: Endorsed. His wife manages all medication, financial, and bill paying responsibilities. While he maintained some degree of functioning after his 2017 stroke, these responsibilities necessitated his wife to take them over after his 2021 event. He does not drive due to his stroke and seizure history, as well as cognitive decline.   Additional  Medical History: History of traumatic brain injury/concussion: Denied. History of stroke: Endorsed (see above).  History of seizure activity: Endorsed (see above). During interview, he and his wife reported his most recent seizure being during an EMU stay in 2021. Medical records suggest him experiencing a left temporal seizure during an EMU admission in April 2022 when seizure medications were tapered. Recurrent seizures were not said to be ongoing, with medication intervention being effective at the present time.  History of known exposure to toxins: Denied. Symptoms of chronic pain: Denied. Experience of frequent headaches/migraines: Denied. His wife reported that he does have headache experiences perhaps 1-2 days every two weeks.  Frequent instances of dizziness/vertigo: Denied.  Sensory changes: He wears glasses with some benefit. However, despite their use, he continues to report ongoing visual acuity concerns. Other sensory changes/difficulties (  e.g., hearing, taste, or smell) were denied.  Balance/coordination difficulties: Endorsed. He uses a walker to ambulate with benefit. He did not report any recent falls. His wife noted that he fell and his head created a hole in the wall in 2021 but otherwise denied any significant injuries directly caused by falls.  Other motor difficulties: Endorsed. Tremors were said to impact both his upper and lowe extremities. He stated that they seem worse on the left side and are far more pronounced while on dialysis.   Sleep History: Estimated hours obtained each night: 8 hours.  Difficulties falling asleep: Denied. Difficulties staying asleep: Denied. Feels rested and refreshed upon awakening: Endorsed.  History of snoring: Denied. History of waking up gasping for air: Denied. Witnessed breath cessation while asleep: Denied.  History of vivid dreaming: Denied. Excessive movement while asleep: Denied. Instances of acting out his dreams:  Denied.  Psychiatric/Behavioral Health History: Depression: He described his current mood as "fine." His wife noted that his prescribed Lexapro has been helpful at improving his mood and managing behavioral dysregulation (see below). Current or remote suicidal ideation, intent, or plan was denied.  Anxiety: Denied. Mania: Denied. Trauma History: Denied. Visual/auditory hallucinations: Denied. However, per medical records, on 08/28/2021, he was "in other Daryl mood," got up at 2:15am and walked independently with a walker. He said there was a man in the room, and when he turned around, he fell onto the table. On 09/06/2021, he was "totally off the charts talking about a lawyer and judge." On 09/19/2021, he was asking his wife to leave his room, wanting his cane. Then on 09/28/2021, he was yelling out to stop smoking. He reported seeing the man coming up the stairs. He did not see his face because [name] told him not to show his face and told him "don't tell anything." He stated "he never goes where I can see him, I think he sees me a lot but makes sure I'm either asleep or sneaking in the house." He emphasized that he has not been imagining these occurrences.  Delusional thoughts: Endorsed. In addition to what is described above, his wife reported that he has times where he appears convinced that she is cheating on him.   Tobacco: Denied. Alcohol: He denied current alcohol consumption as well as a history of problematic alcohol abuse or dependence.  Recreational drugs: Denied.  Family History: Problem Relation Age of Onset   Hypertension Mother    Diabetes Mother    Hypertension Father    Stroke Father    Sickle cell anemia Brother    Sickle cell anemia Brother    Diabetes type I Brother    Kidney disease Brother    Multiple myeloma Maternal Aunt    Mental illness Maternal Aunt        visual hallucinations of unknown origin   This information was confirmed by Mr.  Nelson.  Academic/Vocational History: Highest level of educational attainment: 18 years. He earned a Conservator, museum/gallery in psychology and described himself as a good (A/B) student in academic settings. No relative weaknesses were identified.  History of developmental delay: Denied. History of grade repetition: Denied. Enrollment in special education courses: Denied. History of LD/ADHD: Denied.  Employment: He is currently disabled due to his stroke history and other medical ailments. 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   Evaluation Results:  Behavioral Observations: Mr. Rodkey was accompanied by his wife, arrived to his appointment on time, and was appropriately dressed and groomed. He appeared alert. He was pushed in a wheelchair by his wife, making gait and station unable to be observed. Gross motor functioning appeared intact upon informal observation and no abnormal movements (e.g., tremors) were noted during interview. His affect was generally relaxed and positive, but did range appropriately given the subject being discussed during the clinical interview or the task at hand during testing procedures. Spontaneous speech was dysarthric and pronunciation was challenging at times. Despite this, his speech was fluent and significant word finding difficulties were not observed during the clinical interview. Thought processes were coherent, organized, and normal in content. Insight into his cognitive difficulties appeared fairly limited in that dysfunction was far more severe and widespread than what he described during interview.   During testing, deficits in visual acuity caused several tasks to be discontinued (Dot Counting, BVMT-R, Museum/gallery curator, Coding). He also refused to attempt a figure drawing task due to these difficulties. Other  tasks (TMT A+B, D-KEFS Color Word) were discontinued due to ongoing cognitive impairment and confusion regarding how to attempt the latter conditions. Weakness in his hands/fingers created difficulties completing motorized testing (Pegboard, Finger Tapping). Dysarthric speech was noted throughout testing, as was word finding difficulties. Sustained attention was appropriate. Task engagement was adequate and he persisted when challenged. Overall, Mr. Zappia was cooperative with the clinical interview and subsequent testing procedures.   Adequacy of Effort: The validity of neuropsychological testing is limited by the extent to which the individual being tested may be assumed to have exerted adequate effort during testing. Mr. Eland expressed his intention to perform to the best of his abilities and exhibited adequate task engagement and persistence. Scores across two embedded performance validity measures were below expectation. However, this may certainly be due to true ongoing and quite severe impairment across numerous cognitive domains. He does not appear to have any secondary gain which would lead to him purposefully performing poorly and neuroimaging has documented neurological damage. As such, the results of the current evaluation are believed to be a valid representation of Mr. Vitali current cognitive functioning.  Test Results: Mr. Armijo was mildly disoriented at the time of the current evaluation. He stated the current year as "2121" and was unable to state the current date or name of the current clinic ("[place]").   Intellectual abilities based upon educational and vocational attainment were estimated to be in the average to above average range. Premorbid abilities were estimated to be within the well below average range based upon a single-word reading test despite using a large print format.   Processing speed was exceptionally low. Basic attention was below average. More complex attention  (e.g., working memory) was exceptionally low to well below average. Executive functioning was exceptionally low to well below average.  Assessed receptive language abilities were exceptionally low. Difficulties were exhibited across all aspects of this task, including understanding conceptual information, correctly sequencing commands, correctly following multi-step commands, and understanding complex sentence structure. Assessed expressive language (e.g., verbal fluency and confrontation naming) was exceptionally low.     Assessed visuospatial/visuoconstructional abilities were exceptionally low to well below average. For his drawing of a clock, his outer circle was appropriate. He did not include all 12 numbers and some numbers were placed in the center of the circle rather than around the outer edges. He was unable to draw the clock hands.   Fine motor coordination  and speed was exceptionally low bilaterally, but notably worse when using his dominant (right) hand.   Learning (i.e., encoding) of novel verbal on a list learning task was exceptionally low. Due to an administration error, he was given the older adult version of a story learning task. While direct normative comparisons are inappropriate, it is worth highlighting that he scored in the well below average range based on his raw score when compared to an older peer group. Spontaneous delayed recall (i.e., retrieval) of previously learned information was exceptionally low on a list learning task. Retention rates were 50% across a story learning task and 40% across a list learning task. Performance across a list learning recognition task was exceptionally low. Performance on a story-based recognition task, even when compared to an older population group, was well below average. Both of which suggest minimal evidence for information consolidation.   Results of emotional screening instruments suggested that recent symptoms of generalized anxiety  were in the moderate range, while symptoms of depression were within normal limits. A screening instrument assessing recent sleep quality suggested the presence of minimal sleep dysfunction.  Tables of Scores:   Note: This summary of test scores accompanies the interpretive report and should not be considered in isolation without reference to the appropriate sections in the text. Descriptors are based on appropriate normative data and may be adjusted based on clinical judgment. Terms such as "Within Normal Limits" and "Outside Normal Limits" are used when a more specific description of the test score cannot be determined.       Percentile - Normative Descriptor > 98 - Exceptionally High 91-97 - Well Above Average 75-90 - Above Average 25-74 - Average 9-24 - Below Average 2-8 - Well Below Average < 2 - Exceptionally Low       Validity:   DESCRIPTOR       WAIS-IV Reliable Digit Span: --- --- Outside Normal Limits  HVLT-R Recognition Discrimination Index: --- --- Outside Normal Limits       Orientation:      Raw Score Percentile   NAB Orientation, Form 1 23/29 --- ---       Cognitive Screening:      Raw Score Percentile   SLUMS: 14/30 --- ---       Intellectual Functioning:      Standard Score Percentile   Barona Formula Estimated Premorbid IQ: 103 58 Average        Standard Score Percentile   Test of Premorbid Functioning: 73 4 Well Below Average       Memory:     Wechsler Memory Scale (WMS-IV):                       Raw Score Percentile     Logical Memory I 17/53 --- ---    Logical Memory II 6/39 --- ---    Logical Memory Recognition 15/23 --- ---  *Administration error led to him receiving older adult version         Hopkins Verbal Learning Test (HVLT-R), Form 1: Raw Score (T Score) Percentile     Total Trials 1-3 11/36 (9) <1 Exceptionally Low    Delayed Recall 2/12 (6) <1 Exceptionally Low    Recognition Discrimination Index 3 (-5) <1 Exceptionally Low      True  Positives 12 --- ---      False Positives 9 --- ---        Brief Visuospatial Memory Test (BVMT-R), Form 1: Raw Score (T  Score) Percentile     Total Trials 1-3 Discontinued (vision) --- ---    Delayed Recall --- --- ---    Recognition Discrimination Index --- --- ---    Recognition Hits --- --- ---    False Positive Errors --- --- ---        Attention/Executive Function:     Trail Making Test (TMT): Raw Score (T Score) Percentile     Part A Discontinued (comprehension) --- Impaired    Part B Discontinued (comprehension) --- Impaired        Scaled Score    WAIS-IV Coding: Discontinued (vision) --- ---         Scaled Score Percentile   WAIS-IV Digit Span: 2 <1 Exceptionally Low    Forward 7 16 Below Average    Backward 5 5 Well Below Average    Sequencing 1 <1 Exceptionally Low        Scaled Score Percentile   WAIS-IV Similarities: 4 2 Well Below Average       D-KEFS Color-Word Interference Test: Raw Score (Scaled Score) Percentile     Color Naming 138 secs. (1) <1 Exceptionally Low    Word Reading 133 secs. (1) <1 Exceptionally Low    Inhibition Discontinued (comprehension) --- Impaired    Inhibition/Switching Discontinued (comprehension) --- Impaired       D-KEFS Verbal Fluency Test: Raw Score (Scaled Score) Percentile     Letter Total Correct 5 (1) <1 Exceptionally Low    Category Total Correct 9 (1) <1 Exceptionally Low    Category Switching Total Correct 2 (1) <1 Exceptionally Low    Category Switching Accuracy 0 (1) <1 Exceptionally Low      Total Set Loss Errors 7 (4) 2 Well Below Average      Total Repetition Errors 3 (10) 50 Average       D-KEFS Design Fluency Test: Raw Score (Scaled Score) Percentile     Condition 1 - Filled Dots Discontinued (vision) --- ---    Condition 2 - Empty Dots --- --- ---    Condition 3 - Switching --- --- ---      Total Set Loss Errors --- --- ---      Total Repetition Errors --- --- ---       Language:     Verbal Fluency Test:  Raw Score (T Score) Percentile     Phonemic Fluency (FAS) 5 (12) <1 Exceptionally Low    Animal Fluency 6 (17) <1 Exceptionally Low        NAB Language Module, Form 1: T Score Percentile     Auditory Comprehension 19 <1 Exceptionally Low    Naming 24/31 (19) <1 Exceptionally Low       Visuospatial/Visuoconstruction:      Raw Score Percentile   Clock Drawing: 6/10 --- Impaired       NAB Spatial Module, Form 1: T Score Percentile     Figure Drawing Copy Discontinued  (vision) --- ---        Scaled Score Percentile   WAIS-IV Matrix Reasoning: 4 2 Well Below Average       Sensory-Motor:     [place] Grooved Pegboard Test: Raw Score Percentile     Dominant Hand Discontinued --- Impaired    Non-Dominant Hand Discontinued --- Impaired       Finger Tapping Test: Mean Percentile     Dominant Hand 0 <1 Exceptionally Low    Non-Dominant Hand 24 <1 Exceptionally Low       Mood  and Personality:      Raw Score Percentile   Beck Depression Inventory - II: 9 --- Within Normal Limits  PROMIS Anxiety Questionnaire: 21 --- Moderate       Additional Questionnaires:      Raw Score Percentile   PROMIS Sleep Disturbance Questionnaire: 12 --- None to Slight   Informed Consent and Coding/Compliance:   The current evaluation represents a clinical evaluation for the purposes previously outlined by the referral source and is in no way reflective of a forensic evaluation.   Mr. Leu was provided with a verbal description of the nature and purpose of the present neuropsychological evaluation. Also reviewed were the foreseeable risks and/or discomforts and benefits of the procedure, limits of confidentiality, and mandatory reporting requirements of this provider. The patient was given the opportunity to ask questions and receive answers about the evaluation. Oral consent to participate was provided by the patient.   This evaluation was conducted by Christia Reading, Ph.D., ABPP-CN, board certified  clinical neuropsychologist. Mr. [name] completed a clinical interview with Dr. Melvyn Novas, billed as one unit [phone], and 120 minutes of cognitive testing and scoring, billed as one unit [phone] and three additional units [id number]. Psychometrist Milana Kidney, B.S., assisted Dr. Melvyn Novas with test administration and scoring procedures. As a separate and discrete service, Dr. Melvyn Novas spent a total of 160 minutes in interpretation and report writing billed as one unit [phone] and two units 96133.

## 2021-11-06 ENCOUNTER — Other Ambulatory Visit (HOSPITAL_COMMUNITY): Payer: Self-pay

## 2021-11-06 ENCOUNTER — Telehealth: Payer: Self-pay

## 2021-11-06 DIAGNOSIS — Z992 Dependence on renal dialysis: Secondary | ICD-10-CM | POA: Diagnosis not present

## 2021-11-06 DIAGNOSIS — D631 Anemia in chronic kidney disease: Secondary | ICD-10-CM | POA: Diagnosis not present

## 2021-11-06 DIAGNOSIS — Z20822 Contact with and (suspected) exposure to covid-19: Secondary | ICD-10-CM | POA: Diagnosis not present

## 2021-11-06 DIAGNOSIS — N2581 Secondary hyperparathyroidism of renal origin: Secondary | ICD-10-CM | POA: Diagnosis not present

## 2021-11-06 DIAGNOSIS — N186 End stage renal disease: Secondary | ICD-10-CM | POA: Diagnosis not present

## 2021-11-06 DIAGNOSIS — E1122 Type 2 diabetes mellitus with diabetic chronic kidney disease: Secondary | ICD-10-CM | POA: Diagnosis not present

## 2021-11-06 MED ORDER — LACOSAMIDE 200 MG PO TABS
ORAL_TABLET | ORAL | 5 refills | Status: DC
  Filled 2021-11-06: qty 90, fill #0

## 2021-11-06 NOTE — Telephone Encounter (Signed)
New message   Your information has been sent to [facility].  Bently Macinnes Key: [id number] - [place] Case ID: [id number] help? Call us at [phone] Status Sent to [facility] Lacosamide 200MG tablets Form MedImpact ePA Form 2017 NCPDP

## 2021-11-08 DIAGNOSIS — Z992 Dependence on renal dialysis: Secondary | ICD-10-CM | POA: Diagnosis not present

## 2021-11-08 DIAGNOSIS — N2581 Secondary hyperparathyroidism of renal origin: Secondary | ICD-10-CM | POA: Diagnosis not present

## 2021-11-08 DIAGNOSIS — N186 End stage renal disease: Secondary | ICD-10-CM | POA: Diagnosis not present

## 2021-11-08 DIAGNOSIS — D631 Anemia in chronic kidney disease: Secondary | ICD-10-CM | POA: Diagnosis not present

## 2021-11-08 NOTE — Telephone Encounter (Signed)
F/u  Isaiah [name] Key: [id number] - [place] Case ID: [id number] help? Call us at [phone] Outcome Approvedtoday The request has been approved. The authorization is effective for a maximum of 12 fills from 11/07/2021 to 11/07/2022, as long as the member is enrolled in their current health plan. The request was reviewed and approved by a licensed clinical pharmacist. This request is approved for up to 3 tablets per day. A written notification letter will follow with additional details. Drug Lacosamide 200MG tablets Form MedImpact ePA Form 2017 NCPDP

## 2021-11-11 DIAGNOSIS — N2581 Secondary hyperparathyroidism of renal origin: Secondary | ICD-10-CM | POA: Diagnosis not present

## 2021-11-11 DIAGNOSIS — Z992 Dependence on renal dialysis: Secondary | ICD-10-CM | POA: Diagnosis not present

## 2021-11-11 DIAGNOSIS — D631 Anemia in chronic kidney disease: Secondary | ICD-10-CM | POA: Diagnosis not present

## 2021-11-11 DIAGNOSIS — N186 End stage renal disease: Secondary | ICD-10-CM | POA: Diagnosis not present

## 2021-11-12 ENCOUNTER — Encounter: Payer: 59 | Admitting: Psychology

## 2021-11-12 DIAGNOSIS — E1065 Type 1 diabetes mellitus with hyperglycemia: Secondary | ICD-10-CM | POA: Diagnosis not present

## 2021-11-13 DIAGNOSIS — D631 Anemia in chronic kidney disease: Secondary | ICD-10-CM | POA: Diagnosis not present

## 2021-11-13 DIAGNOSIS — Z992 Dependence on renal dialysis: Secondary | ICD-10-CM | POA: Diagnosis not present

## 2021-11-13 DIAGNOSIS — N2581 Secondary hyperparathyroidism of renal origin: Secondary | ICD-10-CM | POA: Diagnosis not present

## 2021-11-13 DIAGNOSIS — N186 End stage renal disease: Secondary | ICD-10-CM | POA: Diagnosis not present

## 2021-11-14 ENCOUNTER — Ambulatory Visit (INDEPENDENT_AMBULATORY_CARE_PROVIDER_SITE_OTHER): Payer: Medicare Other | Admitting: Psychology

## 2021-11-14 ENCOUNTER — Other Ambulatory Visit: Payer: Self-pay

## 2021-11-14 ENCOUNTER — Encounter: Payer: Self-pay | Admitting: Psychology

## 2021-11-14 DIAGNOSIS — F01518 Vascular dementia, unspecified severity, with other behavioral disturbance: Secondary | ICD-10-CM

## 2021-11-14 DIAGNOSIS — K559 Vascular disorder of intestine, unspecified: Secondary | ICD-10-CM | POA: Insufficient documentation

## 2021-11-14 NOTE — Progress Notes (Signed)
? ?  Neuropsychology Feedback Session ?[facility] ?[place] Department of Neurology ? ?Reason for Referral:  ? ?Isaiah [name] is a 56 y.o. mixed-handed (predominantly right but able to use left, especially since past his prior strokes) African-American male referred by Ellouise Newer, M.D., to characterize his current cognitive functioning and assist with diagnostic clarity and treatment planning in the context of a history of numerous strokes, ongoing seizure activity, and cognitive decline.  ? ?Feedback:  ? ?Isaiah [name] completed a comprehensive neuropsychological evaluation on 11/05/2021. Please refer to that encounter for the full report and recommendations. Briefly, results suggested diffuse and quite severe cognitive impairment, impacting all neurocognitive domains which were able to be assessed. This included processing speed, complex attention, executive functioning, receptive and expressive language, nonverbal abstract reasoning, and verbal learning and memory. While basic attention represented a relative strength, his performance (below average range) is still likely a notable decline given his academic achievement and prior vocational positions. Regarding etiology, Isaiah [name] should be viewed as having a primary vascular etiology (i.e., a vascular dementia presentation). Most recent neuroimaging revealed numerous prior strokes involving nearly every area of the brain to some extent. Specific areas with identified encephalomalacia and gliosis included the right parietoccipital lobe, right frontal lobe, left temporal lobe, left occipital lobe, and left frontoparietal and insular regions. Remote lacunar infarctions were also seen in the left thalamus and bilateral cerebellar hemispheres. Given the sheer number of stroke locations and widespread nature of these events, significant and quite diffuse impairment is certainly a reasonable expectation and finding across testing.  ? ?Isaiah [name] was  accompanied by his wife during the current feedback session. Content of the current session focused on the results of his neuropsychological evaluation. Isaiah [name] was given the opportunity to ask questions and his questions were answered. He was encouraged to reach out should additional questions arise. A copy of his report was provided at the conclusion of the visit.  ? ?  ? ?20 minutes were spent conducting the current feedback session with Isaiah [name], billed as one unit [phone].  ?

## 2021-11-15 ENCOUNTER — Telehealth: Payer: Medicare Other

## 2021-11-15 DIAGNOSIS — N186 End stage renal disease: Secondary | ICD-10-CM | POA: Diagnosis not present

## 2021-11-15 DIAGNOSIS — D631 Anemia in chronic kidney disease: Secondary | ICD-10-CM | POA: Diagnosis not present

## 2021-11-15 DIAGNOSIS — N2581 Secondary hyperparathyroidism of renal origin: Secondary | ICD-10-CM | POA: Diagnosis not present

## 2021-11-15 DIAGNOSIS — Z992 Dependence on renal dialysis: Secondary | ICD-10-CM | POA: Diagnosis not present

## 2021-11-18 ENCOUNTER — Other Ambulatory Visit: Payer: Self-pay | Admitting: Neurology

## 2021-11-18 ENCOUNTER — Other Ambulatory Visit (HOSPITAL_COMMUNITY): Payer: Self-pay

## 2021-11-18 ENCOUNTER — Telehealth: Payer: Self-pay | Admitting: Neurology

## 2021-11-18 DIAGNOSIS — N2581 Secondary hyperparathyroidism of renal origin: Secondary | ICD-10-CM | POA: Diagnosis not present

## 2021-11-18 DIAGNOSIS — D631 Anemia in chronic kidney disease: Secondary | ICD-10-CM | POA: Diagnosis not present

## 2021-11-18 DIAGNOSIS — Z992 Dependence on renal dialysis: Secondary | ICD-10-CM | POA: Diagnosis not present

## 2021-11-18 DIAGNOSIS — N186 End stage renal disease: Secondary | ICD-10-CM | POA: Diagnosis not present

## 2021-11-18 MED ORDER — LACOSAMIDE 200 MG PO TABS
ORAL_TABLET | ORAL | 1 refills | Status: DC
Start: 2021-11-18 — End: 2022-04-30
  Filled 2021-11-25: qty 12, 5d supply, fill #0
  Filled 2021-11-25: qty 194, 79d supply, fill #0
  Filled 2022-02-21: qty 206, 84d supply, fill #1

## 2021-11-18 NOTE — Telephone Encounter (Signed)
Patients wife called and stated she was calling about his medication Vimpat.. She says the pharmacy has it down for 90 tablets, but with all he has to take it it needs to be for more. ?

## 2021-11-18 NOTE — Telephone Encounter (Signed)
Script sent to Dr Delice Lesch to sign off on  ?

## 2021-11-19 ENCOUNTER — Telehealth: Payer: Self-pay

## 2021-11-19 ENCOUNTER — Telehealth: Payer: Medicare Other

## 2021-11-19 NOTE — Telephone Encounter (Signed)
?  Care Management  ? ?Follow Up Note ? ? ?11/19/2021 ?Name: Isaiah [name] [facility] MRN: [id number] DOB: 24-Oct-1965 ? ? ?Referred by: [name], MD ?Reason for referral : Chronic Care Management (RN CM Follow up call ) ? ? ?An unsuccessful telephone outreach was attempted today. The patient was referred to the case management team for assistance with care management and care coordination.  ? ?Follow Up Plan: A HIPPA compliant phone message was left for the patient providing contact information and requesting a return call.  ? ?Barb Merino, RN, BSN, CCM ?Care Management Coordinator ?[facility]  ?Direct Phone: [phone] ? ? ?

## 2021-11-20 DIAGNOSIS — N2581 Secondary hyperparathyroidism of renal origin: Secondary | ICD-10-CM | POA: Diagnosis not present

## 2021-11-20 DIAGNOSIS — N186 End stage renal disease: Secondary | ICD-10-CM | POA: Diagnosis not present

## 2021-11-20 DIAGNOSIS — D631 Anemia in chronic kidney disease: Secondary | ICD-10-CM | POA: Diagnosis not present

## 2021-11-20 DIAGNOSIS — Z992 Dependence on renal dialysis: Secondary | ICD-10-CM | POA: Diagnosis not present

## 2021-11-22 DIAGNOSIS — Z992 Dependence on renal dialysis: Secondary | ICD-10-CM | POA: Diagnosis not present

## 2021-11-22 DIAGNOSIS — Z760 Encounter for issue of repeat prescription: Secondary | ICD-10-CM | POA: Diagnosis not present

## 2021-11-22 DIAGNOSIS — N186 End stage renal disease: Secondary | ICD-10-CM | POA: Diagnosis not present

## 2021-11-22 DIAGNOSIS — N2581 Secondary hyperparathyroidism of renal origin: Secondary | ICD-10-CM | POA: Diagnosis not present

## 2021-11-22 DIAGNOSIS — D631 Anemia in chronic kidney disease: Secondary | ICD-10-CM | POA: Diagnosis not present

## 2021-11-25 ENCOUNTER — Other Ambulatory Visit (HOSPITAL_COMMUNITY): Payer: Self-pay

## 2021-11-25 DIAGNOSIS — N2581 Secondary hyperparathyroidism of renal origin: Secondary | ICD-10-CM | POA: Diagnosis not present

## 2021-11-25 DIAGNOSIS — N186 End stage renal disease: Secondary | ICD-10-CM | POA: Diagnosis not present

## 2021-11-25 DIAGNOSIS — D631 Anemia in chronic kidney disease: Secondary | ICD-10-CM | POA: Diagnosis not present

## 2021-11-25 DIAGNOSIS — Z992 Dependence on renal dialysis: Secondary | ICD-10-CM | POA: Diagnosis not present

## 2021-11-26 ENCOUNTER — Other Ambulatory Visit: Payer: Self-pay | Admitting: Internal Medicine

## 2021-11-26 ENCOUNTER — Other Ambulatory Visit (HOSPITAL_COMMUNITY): Payer: Self-pay

## 2021-11-26 MED ORDER — MIDODRINE HCL 10 MG PO TABS
10.0000 mg | ORAL_TABLET | Freq: Two times a day (BID) | ORAL | 1 refills | Status: DC
  Filled 2021-11-26: qty 180, 90d supply, fill #0
  Filled 2022-02-20: qty 180, 90d supply, fill #1

## 2021-11-27 ENCOUNTER — Other Ambulatory Visit (HOSPITAL_COMMUNITY): Payer: Self-pay

## 2021-11-27 DIAGNOSIS — N186 End stage renal disease: Secondary | ICD-10-CM | POA: Diagnosis not present

## 2021-11-27 DIAGNOSIS — D631 Anemia in chronic kidney disease: Secondary | ICD-10-CM | POA: Diagnosis not present

## 2021-11-27 DIAGNOSIS — Z992 Dependence on renal dialysis: Secondary | ICD-10-CM | POA: Diagnosis not present

## 2021-11-27 DIAGNOSIS — N2581 Secondary hyperparathyroidism of renal origin: Secondary | ICD-10-CM | POA: Diagnosis not present

## 2021-11-28 ENCOUNTER — Other Ambulatory Visit (HOSPITAL_COMMUNITY): Payer: Self-pay

## 2021-11-28 ENCOUNTER — Encounter: Payer: 59 | Admitting: Psychology

## 2021-11-29 DIAGNOSIS — D631 Anemia in chronic kidney disease: Secondary | ICD-10-CM | POA: Diagnosis not present

## 2021-11-29 DIAGNOSIS — N2581 Secondary hyperparathyroidism of renal origin: Secondary | ICD-10-CM | POA: Diagnosis not present

## 2021-11-29 DIAGNOSIS — Z992 Dependence on renal dialysis: Secondary | ICD-10-CM | POA: Diagnosis not present

## 2021-11-29 DIAGNOSIS — N186 End stage renal disease: Secondary | ICD-10-CM | POA: Diagnosis not present

## 2021-12-02 DIAGNOSIS — N2581 Secondary hyperparathyroidism of renal origin: Secondary | ICD-10-CM | POA: Diagnosis not present

## 2021-12-02 DIAGNOSIS — Z992 Dependence on renal dialysis: Secondary | ICD-10-CM | POA: Diagnosis not present

## 2021-12-02 DIAGNOSIS — N186 End stage renal disease: Secondary | ICD-10-CM | POA: Diagnosis not present

## 2021-12-02 DIAGNOSIS — D631 Anemia in chronic kidney disease: Secondary | ICD-10-CM | POA: Diagnosis not present

## 2021-12-04 DIAGNOSIS — N186 End stage renal disease: Secondary | ICD-10-CM | POA: Diagnosis not present

## 2021-12-04 DIAGNOSIS — Z992 Dependence on renal dialysis: Secondary | ICD-10-CM | POA: Diagnosis not present

## 2021-12-04 DIAGNOSIS — D631 Anemia in chronic kidney disease: Secondary | ICD-10-CM | POA: Diagnosis not present

## 2021-12-04 DIAGNOSIS — N2581 Secondary hyperparathyroidism of renal origin: Secondary | ICD-10-CM | POA: Diagnosis not present

## 2021-12-06 DIAGNOSIS — D631 Anemia in chronic kidney disease: Secondary | ICD-10-CM | POA: Diagnosis not present

## 2021-12-06 DIAGNOSIS — N186 End stage renal disease: Secondary | ICD-10-CM | POA: Diagnosis not present

## 2021-12-06 DIAGNOSIS — N2581 Secondary hyperparathyroidism of renal origin: Secondary | ICD-10-CM | POA: Diagnosis not present

## 2021-12-06 DIAGNOSIS — Z992 Dependence on renal dialysis: Secondary | ICD-10-CM | POA: Diagnosis not present

## 2021-12-07 DIAGNOSIS — Z992 Dependence on renal dialysis: Secondary | ICD-10-CM | POA: Diagnosis not present

## 2021-12-07 DIAGNOSIS — E1122 Type 2 diabetes mellitus with diabetic chronic kidney disease: Secondary | ICD-10-CM | POA: Diagnosis not present

## 2021-12-07 DIAGNOSIS — N186 End stage renal disease: Secondary | ICD-10-CM | POA: Diagnosis not present

## 2021-12-09 DIAGNOSIS — D631 Anemia in chronic kidney disease: Secondary | ICD-10-CM | POA: Diagnosis not present

## 2021-12-09 DIAGNOSIS — N186 End stage renal disease: Secondary | ICD-10-CM | POA: Diagnosis not present

## 2021-12-09 DIAGNOSIS — N2581 Secondary hyperparathyroidism of renal origin: Secondary | ICD-10-CM | POA: Diagnosis not present

## 2021-12-09 DIAGNOSIS — Z992 Dependence on renal dialysis: Secondary | ICD-10-CM | POA: Diagnosis not present

## 2021-12-11 DIAGNOSIS — E1122 Type 2 diabetes mellitus with diabetic chronic kidney disease: Secondary | ICD-10-CM | POA: Diagnosis not present

## 2021-12-11 DIAGNOSIS — N186 End stage renal disease: Secondary | ICD-10-CM | POA: Diagnosis not present

## 2021-12-11 DIAGNOSIS — D631 Anemia in chronic kidney disease: Secondary | ICD-10-CM | POA: Diagnosis not present

## 2021-12-11 DIAGNOSIS — Z992 Dependence on renal dialysis: Secondary | ICD-10-CM | POA: Diagnosis not present

## 2021-12-11 DIAGNOSIS — N2581 Secondary hyperparathyroidism of renal origin: Secondary | ICD-10-CM | POA: Diagnosis not present

## 2021-12-12 DIAGNOSIS — E113593 Type 2 diabetes mellitus with proliferative diabetic retinopathy without macular edema, bilateral: Secondary | ICD-10-CM | POA: Diagnosis not present

## 2021-12-12 DIAGNOSIS — D571 Sickle-cell disease without crisis: Secondary | ICD-10-CM | POA: Diagnosis not present

## 2021-12-12 DIAGNOSIS — H31093 Other chorioretinal scars, bilateral: Secondary | ICD-10-CM | POA: Diagnosis not present

## 2021-12-12 DIAGNOSIS — H4322 Crystalline deposits in vitreous body, left eye: Secondary | ICD-10-CM | POA: Diagnosis not present

## 2021-12-12 DIAGNOSIS — H35033 Hypertensive retinopathy, bilateral: Secondary | ICD-10-CM | POA: Diagnosis not present

## 2021-12-12 DIAGNOSIS — H40013 Open angle with borderline findings, low risk, bilateral: Secondary | ICD-10-CM | POA: Diagnosis not present

## 2021-12-13 DIAGNOSIS — E1065 Type 1 diabetes mellitus with hyperglycemia: Secondary | ICD-10-CM | POA: Diagnosis not present

## 2021-12-13 DIAGNOSIS — N186 End stage renal disease: Secondary | ICD-10-CM | POA: Diagnosis not present

## 2021-12-13 DIAGNOSIS — Z992 Dependence on renal dialysis: Secondary | ICD-10-CM | POA: Diagnosis not present

## 2021-12-13 DIAGNOSIS — D631 Anemia in chronic kidney disease: Secondary | ICD-10-CM | POA: Diagnosis not present

## 2021-12-13 DIAGNOSIS — N2581 Secondary hyperparathyroidism of renal origin: Secondary | ICD-10-CM | POA: Diagnosis not present

## 2021-12-16 DIAGNOSIS — D631 Anemia in chronic kidney disease: Secondary | ICD-10-CM | POA: Diagnosis not present

## 2021-12-16 DIAGNOSIS — N2581 Secondary hyperparathyroidism of renal origin: Secondary | ICD-10-CM | POA: Diagnosis not present

## 2021-12-16 DIAGNOSIS — N186 End stage renal disease: Secondary | ICD-10-CM | POA: Diagnosis not present

## 2021-12-16 DIAGNOSIS — Z992 Dependence on renal dialysis: Secondary | ICD-10-CM | POA: Diagnosis not present

## 2021-12-17 ENCOUNTER — Encounter: Payer: Self-pay | Admitting: Internal Medicine

## 2021-12-18 DIAGNOSIS — N2581 Secondary hyperparathyroidism of renal origin: Secondary | ICD-10-CM | POA: Diagnosis not present

## 2021-12-18 DIAGNOSIS — Z992 Dependence on renal dialysis: Secondary | ICD-10-CM | POA: Diagnosis not present

## 2021-12-18 DIAGNOSIS — N186 End stage renal disease: Secondary | ICD-10-CM | POA: Diagnosis not present

## 2021-12-18 DIAGNOSIS — D631 Anemia in chronic kidney disease: Secondary | ICD-10-CM | POA: Diagnosis not present

## 2021-12-20 DIAGNOSIS — N2581 Secondary hyperparathyroidism of renal origin: Secondary | ICD-10-CM | POA: Diagnosis not present

## 2021-12-20 DIAGNOSIS — D631 Anemia in chronic kidney disease: Secondary | ICD-10-CM | POA: Diagnosis not present

## 2021-12-20 DIAGNOSIS — Z992 Dependence on renal dialysis: Secondary | ICD-10-CM | POA: Diagnosis not present

## 2021-12-20 DIAGNOSIS — N186 End stage renal disease: Secondary | ICD-10-CM | POA: Diagnosis not present

## 2021-12-20 NOTE — Progress Notes (Signed)
?[facility] Note ? ?12/24/2021 ? ?  ? ?CHIEF COMPLAINT ?Patient presents for Retina Follow Up ? ? ?HISTORY OF PRESENT ILLNESS: ?Isaiah [name] is a 56 y.o. male who presents to the clinic today for:  ? ?HPI   ? ? Retina Follow Up   ?Patient presents with  Diabetic Retinopathy (Sickle Cell Ret  ?HTN Ret OU).  In both eyes.  This started years ago.  Severity is moderate.  Duration of 2 months.  Since onset it is gradually worsening.  I, the attending physician,  performed the HPI with the patient and updated documentation appropriately. ? ?  ?  ? ? Comments   ?Patient feels that the vision has decreased at a distance and near. He feels that the right eye is worse than the left.  His blood sugar was 186 check in office. His A1C is 5.6. He feels that the eyes are dry and water.  ? ?  ?  ?Last edited by [name], MD on 12/25/2021 12:38 AM.  ?  ?Pt states no new health concerns, no new visual complaints ? ?Referring physician: ?Lisabeth Pick, MD ?[address] ?[place],  [place] [address] ? ?HISTORICAL INFORMATION:  ? ?Selected notes from the [facility] ?Pt of Dr. [name].  Urgent re-referral from Dr. [name] for possible PDR w/NVI OD.  Presented to [facility] on 7.27.21 w/elevated IOPS and OD swelling and irritation.  Started on Brimonidine BID OD and Pred Q1hr OD.  ? ?CURRENT MEDICATIONS: ?No current outpatient medications on file. (Ophthalmic Drugs)  ? ?No current facility-administered medications for this visit. (Ophthalmic Drugs)  ? ?Current Outpatient Medications (Other)  ?Medication Sig  ? acetaminophen (TYLENOL) 325 MG tablet Take 1-2 tablets (325-650 mg total) by mouth every 4 (four) hours as needed for mild pain.  ? albuterol (PROAIR HFA) 108 (90 Base) MCG/ACT inhaler Inhale two puffs every 4-6 hours if needed for cough or wheeze  ? Alcohol Swabs (SM ALCOHOL PREP) 70 % PADS   ? atorvastatin (LIPITOR) 20 MG tablet TAKE 1 TABLET BY MOUTH ONCE A DAY  ? AURYXIA 1  GM 210 MG(Fe) tablet Take 210 mg by mouth every evening.  ? calcitRIOL (ROCALTROL) 0.25 MCG capsule Take 0.25 mcg by mouth daily.  ? Cetirizine HCl 10 MG CAPS Take 1 capsule by mouth daily.   ? Cholecalciferol (D3-1000 PO) Take 1 tablet by mouth See admin instructions. Take one tablet by mouth on Monday,Wednesday and Fridays  ? clonazePAM (KLONOPIN) 0.5 MG tablet Take 2 tablets in morning on Monday, Wednesday, Friday before dialysis  ? escitalopram (LEXAPRO) 10 MG tablet Take 1 tablet (10 mg total) by mouth daily.  ? ferric citrate (AURYXIA) 1 GM 210 MG(Fe) tablet Take 1 tablet by mouth 3 times a day with meals as directed  ? fluticasone (FLONASE) 50 MCG/ACT nasal spray Place 1 spray into both nostrils as needed for allergies.  ? fluticasone furoate-vilanterol (BREO ELLIPTA) 200-25 MCG/ACT AEPB Inhale 1 puff by mouth into the lungs at the same time everyday  ? fluticasone furoate-vilanterol (BREO ELLIPTA) 200-25 MCG/INH AEPB INHALE 1 PUFF BY MOUTH INTO THE LUNGS AT THE SAME TIME EVERY DAY  ? FREESTYLE LITE test strip   ? Glucagon, rDNA, (GLUCAGON EMERGENCY) 1 MG KIT See admin instructions.  ? glucose blood (FREESTYLE LITE) test strip Use as directed to test blood glucose levels 3 times daily.  ? hydrOXYzine (ATARAX) 25 MG tablet Take 1 tablet (25 mg total)  by mouth as needed.  ? insulin glargine-yfgn (SEMGLEE, YFGN,) 100 UNIT/ML Pen Inject 6 units under the skin once daily (discard pen after 28 days from first use)  ? Insulin Glargine-yfgn (SEMGLEE, YFGN,) 100 UNIT/ML SOPN Inject 6 units subcutaneously once a day  ? Insulin Lispro (HUMALOG KWIKPEN [place]) Inject 2-4 Units into the skin 3 (three) times daily before meals. Sliding scale: 150-250 =2 units, 250 -350=3 units, 350 += 4 units  ? Insulin Pen Needle 32G X 4 MM MISC USE AS DIRECTED 4 TO 5 TIMES DAILY TO INJECT INSULIN  ? lacosamide (VIMPAT) 200 MG TABS tablet TAKE 1 TABLET TWICE A DAY EXCEPT ON DIALYSIS DAYS M-W-F GIVE 1 TAB IN AM, 1 TAB AFTER DIALYSIS, 1 TAB IN  PM.  ? lamoTRIgine (LAMICTAL) 200 MG tablet TAKE 1 TABLET BY MOUTH 2 TIMES DAILY  ? Lancets (FREESTYLE) lancets   ? Lancets (FREESTYLE) lancets Use as directed to test blood glucose levels three times daily.  ? Methoxy PEG-Epoetin Beta (MIRCERA IJ)   ? metoprolol succinate (TOPROL-XL) 25 MG 24 hr tablet Take 25 mg by mouth 2 (two) times daily.  ? midodrine (PROAMATINE) 10 MG tablet Take 1 tablet by mouth 2 times daily.  ? multivitamin (RENA-VIT) TABS tablet Take 1 tablet by mouth at bedtime.  ? pantoprazole (PROTONIX) 40 MG tablet TAKE 1 TABLET BY MOUTH ONCE DAILY  ? prochlorperazine (COMPAZINE) 10 MG tablet Take 10 mg by mouth every 6 (six) hours as needed for nausea.  ? prochlorperazine (COMPAZINE) 10 MG tablet TAKE 1 TABLET BY MOUTH EVERY 6 HOURS AS NEEDED FOR NAUSEA OR VOMITING.  ? QUEtiapine (SEROQUEL) 25 MG tablet Take 25 mg by mouth as needed (relalaxion).  ? UNIFINE PENTIPS 31G X 8 MM MISC USE AS DIRECTED DAILY AFTER SUPPER  ? ?No current facility-administered medications for this visit. (Other)  ? ?REVIEW OF SYSTEMS: ?ROS   ?Positive for: Gastrointestinal, Neurological, Genitourinary, Endocrine, Cardiovascular, Eyes, Respiratory ?Negative for: Constitutional, Skin, Musculoskeletal, HENT, Psychiatric, Allergic/Imm, Heme/Lymph ?Last edited by Annie Paras, COT on 12/24/2021 10:07 AM.  ?  ? ? ?ALLERGIES ?Allergies  ?Allergen Reactions  ? Codeine Swelling  ? Codeine Rash and Other (See Comments)  ?  Unknown reaction (patient says it was more serious than just a rash, but he can't remember what happened) ?  ? ?PAST MEDICAL HISTORY ?Past Medical History:  ?Diagnosis Date  ? Abnormality of gait 09/13/2019  ? Acute metabolic encephalopathy 82/50/0370  ? Allergy 06/27/2019  ? AMS (altered mental status) 10/05/2019  ? Anaphylactic shock, unspecified 07/30/2021  ? Aphasia due to acute cerebrovascular accident 02/28/2016  ? Aspiration pneumonia 06/09/2019  ? Bacteremia   ? Bilateral kidney stones 05/31/2019  ?  Chronic intracranial subdural hematoma 06/09/2019  ? COPD (chronic obstructive pulmonary disease) 11/11/2016  ? CVA (cerebral vascular accident)   ? 2017 and 2021; R-MCA, L-MCA, PCA and bilateral cerebellar complicated by DVT/PE  ? Delusional thoughts, secondary to stroke 2021  ? Diabetic retinopathy   ? PDR OU  ? Dysphagia, oropharyngeal phase 07/05/2019  ? ESRD (end stage renal disease) on dialysis   ? Essential hypertension 06/09/2019  ? 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 GERD (gastroesophageal reflux disease) 11/11/2016  ? GIB (gastrointestinal bleeding)   ? Recurrent episodes- 09/2014, 09/2015 and 05/2016  ? Gout   ? Hammer toe, acquired 03/03/2016  ? Headache 09/28/2019  ? Hemiparesis affecting right side as late effect of cerebrovascular accident 02/28/2016  ? History of DVT (deep vein thrombosis) 01/08/2016  ?  History of echocardiogram 08/2021  ? mod septal LVH o/w mild conc LVH, EF 55-60, no RWMA, Gr 2 DD, normal RVSF, severe LAE, trivial MR  ? History of ischemic left MCA stroke 01/08/2016  ? History of recent blood transfusion 10/27/2014  ? 2 Units PRBC's  ? Hyperkalemia 07/2011  ? Hyperparathyroidism, secondary renal 11/11/2016  ? Hypocalcemia 12/26/2015  ? Hypotension 11/11/2016  ? Hypothyroidism 07/15/2019  ? Iron deficiency anemia, unspecified 01/22/2017  ? Ischemic colitis   ? 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  ? Labile blood glucose   ? Labile blood pressure   ? Leukocytosis 01/08/2016  ? Multiple myeloma 05/17/2018  ? Onychomycosis due to dermatophyte 03/03/2016  ? Other specified coagulation defects 11/02/2015  ? PAF (paroxysmal atrial fibrillation)   ? Partial idiopathic epilepsy with seizures of localized onset, intractable, without status epilepticus 11/02/2017  ? Patent foramen ovale 11/11/2016  ? Proteus infection 05/28/2019  ? Pruritus, unspecified 11/02/2015  ? Pulmonary hypertension 11/11/2016  ? ? By echo 09/2014  RHC on 04/21/19 for further investigation of the nature of  his elevated RVSP estimates on non-invasive imaging. His RHC (performed on a Thursday following HD on a Wednesday) demonstrated:  RA 10, RV 67/9, mPA 38, PCWP 19, CO 9.6, CI 4.5, PVR 2.0.   These find

## 2021-12-23 ENCOUNTER — Other Ambulatory Visit (HOSPITAL_COMMUNITY): Payer: Self-pay

## 2021-12-23 DIAGNOSIS — D631 Anemia in chronic kidney disease: Secondary | ICD-10-CM | POA: Diagnosis not present

## 2021-12-23 DIAGNOSIS — N2581 Secondary hyperparathyroidism of renal origin: Secondary | ICD-10-CM | POA: Diagnosis not present

## 2021-12-23 DIAGNOSIS — Z992 Dependence on renal dialysis: Secondary | ICD-10-CM | POA: Diagnosis not present

## 2021-12-23 DIAGNOSIS — N186 End stage renal disease: Secondary | ICD-10-CM | POA: Diagnosis not present

## 2021-12-24 ENCOUNTER — Encounter (INDEPENDENT_AMBULATORY_CARE_PROVIDER_SITE_OTHER): Payer: Self-pay | Admitting: Ophthalmology

## 2021-12-24 ENCOUNTER — Ambulatory Visit (INDEPENDENT_AMBULATORY_CARE_PROVIDER_SITE_OTHER): Payer: Medicare Other | Admitting: Ophthalmology

## 2021-12-24 DIAGNOSIS — H04123 Dry eye syndrome of bilateral lacrimal glands: Secondary | ICD-10-CM

## 2021-12-24 DIAGNOSIS — H35033 Hypertensive retinopathy, bilateral: Secondary | ICD-10-CM

## 2021-12-24 DIAGNOSIS — I10 Essential (primary) hypertension: Secondary | ICD-10-CM

## 2021-12-24 DIAGNOSIS — Z20822 Contact with and (suspected) exposure to covid-19: Secondary | ICD-10-CM | POA: Diagnosis not present

## 2021-12-24 DIAGNOSIS — E113593 Type 2 diabetes mellitus with proliferative diabetic retinopathy without macular edema, bilateral: Secondary | ICD-10-CM

## 2021-12-24 DIAGNOSIS — H3582 Retinal ischemia: Secondary | ICD-10-CM | POA: Diagnosis not present

## 2021-12-24 DIAGNOSIS — H36 Retinal disorders in diseases classified elsewhere: Secondary | ICD-10-CM | POA: Diagnosis not present

## 2021-12-24 DIAGNOSIS — D571 Sickle-cell disease without crisis: Secondary | ICD-10-CM

## 2021-12-24 DIAGNOSIS — Z961 Presence of intraocular lens: Secondary | ICD-10-CM

## 2021-12-25 ENCOUNTER — Encounter (INDEPENDENT_AMBULATORY_CARE_PROVIDER_SITE_OTHER): Payer: Self-pay | Admitting: Ophthalmology

## 2021-12-25 DIAGNOSIS — Z992 Dependence on renal dialysis: Secondary | ICD-10-CM | POA: Diagnosis not present

## 2021-12-25 DIAGNOSIS — D631 Anemia in chronic kidney disease: Secondary | ICD-10-CM | POA: Diagnosis not present

## 2021-12-25 DIAGNOSIS — N186 End stage renal disease: Secondary | ICD-10-CM | POA: Diagnosis not present

## 2021-12-25 DIAGNOSIS — N2581 Secondary hyperparathyroidism of renal origin: Secondary | ICD-10-CM | POA: Diagnosis not present

## 2021-12-27 DIAGNOSIS — Z992 Dependence on renal dialysis: Secondary | ICD-10-CM | POA: Diagnosis not present

## 2021-12-27 DIAGNOSIS — D631 Anemia in chronic kidney disease: Secondary | ICD-10-CM | POA: Diagnosis not present

## 2021-12-27 DIAGNOSIS — N2581 Secondary hyperparathyroidism of renal origin: Secondary | ICD-10-CM | POA: Diagnosis not present

## 2021-12-27 DIAGNOSIS — N186 End stage renal disease: Secondary | ICD-10-CM | POA: Diagnosis not present

## 2021-12-30 DIAGNOSIS — Z992 Dependence on renal dialysis: Secondary | ICD-10-CM | POA: Diagnosis not present

## 2021-12-30 DIAGNOSIS — N2581 Secondary hyperparathyroidism of renal origin: Secondary | ICD-10-CM | POA: Diagnosis not present

## 2021-12-30 DIAGNOSIS — D631 Anemia in chronic kidney disease: Secondary | ICD-10-CM | POA: Diagnosis not present

## 2021-12-30 DIAGNOSIS — N186 End stage renal disease: Secondary | ICD-10-CM | POA: Diagnosis not present

## 2021-12-31 DIAGNOSIS — N186 End stage renal disease: Secondary | ICD-10-CM | POA: Diagnosis not present

## 2021-12-31 DIAGNOSIS — I871 Compression of vein: Secondary | ICD-10-CM | POA: Diagnosis not present

## 2021-12-31 DIAGNOSIS — T82858A Stenosis of vascular prosthetic devices, implants and grafts, initial encounter: Secondary | ICD-10-CM | POA: Diagnosis not present

## 2021-12-31 DIAGNOSIS — Z992 Dependence on renal dialysis: Secondary | ICD-10-CM | POA: Diagnosis not present

## 2022-01-01 ENCOUNTER — Other Ambulatory Visit: Payer: 59

## 2022-01-01 ENCOUNTER — Inpatient Hospital Stay: Payer: Medicare Other

## 2022-01-01 DIAGNOSIS — N186 End stage renal disease: Secondary | ICD-10-CM | POA: Diagnosis not present

## 2022-01-01 DIAGNOSIS — Z992 Dependence on renal dialysis: Secondary | ICD-10-CM | POA: Diagnosis not present

## 2022-01-01 DIAGNOSIS — D631 Anemia in chronic kidney disease: Secondary | ICD-10-CM | POA: Diagnosis not present

## 2022-01-01 DIAGNOSIS — N2581 Secondary hyperparathyroidism of renal origin: Secondary | ICD-10-CM | POA: Diagnosis not present

## 2022-01-02 DIAGNOSIS — M2041 Other hammer toe(s) (acquired), right foot: Secondary | ICD-10-CM | POA: Diagnosis not present

## 2022-01-02 DIAGNOSIS — M2042 Other hammer toe(s) (acquired), left foot: Secondary | ICD-10-CM | POA: Diagnosis not present

## 2022-01-02 DIAGNOSIS — B351 Tinea unguium: Secondary | ICD-10-CM | POA: Diagnosis not present

## 2022-01-03 ENCOUNTER — Telehealth: Payer: Self-pay | Admitting: *Deleted

## 2022-01-03 ENCOUNTER — Other Ambulatory Visit: Payer: Self-pay

## 2022-01-03 ENCOUNTER — Encounter: Payer: Self-pay | Admitting: *Deleted

## 2022-01-03 ENCOUNTER — Inpatient Hospital Stay: Payer: Medicare Other | Attending: Oncology

## 2022-01-03 DIAGNOSIS — D631 Anemia in chronic kidney disease: Secondary | ICD-10-CM | POA: Diagnosis not present

## 2022-01-03 DIAGNOSIS — C9000 Multiple myeloma not having achieved remission: Secondary | ICD-10-CM | POA: Diagnosis not present

## 2022-01-03 DIAGNOSIS — Z992 Dependence on renal dialysis: Secondary | ICD-10-CM | POA: Diagnosis not present

## 2022-01-03 DIAGNOSIS — N186 End stage renal disease: Secondary | ICD-10-CM | POA: Diagnosis not present

## 2022-01-03 DIAGNOSIS — N2581 Secondary hyperparathyroidism of renal origin: Secondary | ICD-10-CM | POA: Diagnosis not present

## 2022-01-03 LAB — CMP (CANCER CENTER ONLY)
ALT: 14 U/L (ref 0–44)
AST: 13 U/L — ABNORMAL LOW (ref 15–41)
Albumin: 4.4 g/dL (ref 3.5–5.0)
Alkaline Phosphatase: 87 U/L (ref 38–126)
Anion gap: 11 (ref 5–15)
BUN: 38 mg/dL — ABNORMAL HIGH (ref 6–20)
CO2: 34 mmol/L — ABNORMAL HIGH (ref 22–32)
Calcium: 10.4 mg/dL — ABNORMAL HIGH (ref 8.9–10.3)
Chloride: 99 mmol/L (ref 98–111)
Creatinine: 9.01 mg/dL (ref 0.61–1.24)
GFR, Estimated: 6 mL/min — ABNORMAL LOW (ref >60)
Glucose, Bld: 131 mg/dL — ABNORMAL HIGH (ref 70–99)
Potassium: 3.9 mmol/L (ref 3.5–5.1)
Sodium: 144 mmol/L (ref 135–145)
Total Bilirubin: 0.9 mg/dL (ref 0.3–1.2)
Total Protein: 7.2 g/dL (ref 6.5–8.1)

## 2022-01-03 LAB — CBC WITH DIFFERENTIAL (CANCER CENTER ONLY)
Abs Immature Granulocytes: 0.02 10*3/uL (ref 0.00–0.07)
Basophils Absolute: 0.1 10*3/uL (ref 0.0–0.1)
Basophils Relative: 1 %
Eosinophils Absolute: 1.1 10*3/uL — ABNORMAL HIGH (ref 0.0–0.5)
Eosinophils Relative: 12 %
HCT: 30.3 % — ABNORMAL LOW (ref 39.0–52.0)
Hemoglobin: 11.2 g/dL — ABNORMAL LOW (ref 13.0–17.0)
Immature Granulocytes: 0 %
Lymphocytes Relative: 17 %
Lymphs Abs: 1.6 10*3/uL (ref 0.7–4.0)
MCH: 31.5 pg (ref 26.0–34.0)
MCHC: 37 g/dL — ABNORMAL HIGH (ref 30.0–36.0)
MCV: 85.1 fL (ref 80.0–100.0)
Monocytes Absolute: 1.1 10*3/uL — ABNORMAL HIGH (ref 0.1–1.0)
Monocytes Relative: 12 %
Neutro Abs: 5.4 10*3/uL (ref 1.7–7.7)
Neutrophils Relative %: 58 %
Platelet Count: 227 10*3/uL (ref 150–400)
RBC: 3.56 MIL/uL — ABNORMAL LOW (ref 4.22–5.81)
RDW: 14.8 % (ref 11.5–15.5)
WBC Count: 9.3 10*3/uL (ref 4.0–10.5)
nRBC: 1 % — ABNORMAL HIGH (ref 0.0–0.2)

## 2022-01-03 NOTE — Telephone Encounter (Signed)
CRITICAL VALUE STICKER ? ?CRITICAL VALUE: Creatinine 9.01 ? ?RECEIVER (on-site recipient of call):Sharlynn Oliphant, RN ? ?DATE & TIME NOTIFIED: 01/03/22 1015 ? ?MD NOTIFIED: Alen Blew ? ?TIME OF NOTIFICATION: 4966 ? ? ?

## 2022-01-06 ENCOUNTER — Telehealth: Payer: Self-pay | Admitting: Oncology

## 2022-01-06 DIAGNOSIS — D631 Anemia in chronic kidney disease: Secondary | ICD-10-CM | POA: Diagnosis not present

## 2022-01-06 DIAGNOSIS — E1122 Type 2 diabetes mellitus with diabetic chronic kidney disease: Secondary | ICD-10-CM | POA: Diagnosis not present

## 2022-01-06 DIAGNOSIS — Z79899 Other long term (current) drug therapy: Secondary | ICD-10-CM | POA: Diagnosis not present

## 2022-01-06 DIAGNOSIS — Z992 Dependence on renal dialysis: Secondary | ICD-10-CM | POA: Diagnosis not present

## 2022-01-06 DIAGNOSIS — N186 End stage renal disease: Secondary | ICD-10-CM | POA: Diagnosis not present

## 2022-01-06 DIAGNOSIS — N2581 Secondary hyperparathyroidism of renal origin: Secondary | ICD-10-CM | POA: Diagnosis not present

## 2022-01-06 DIAGNOSIS — Z8673 Personal history of transient ischemic attack (TIA), and cerebral infarction without residual deficits: Secondary | ICD-10-CM | POA: Diagnosis not present

## 2022-01-06 DIAGNOSIS — C9000 Multiple myeloma not having achieved remission: Secondary | ICD-10-CM | POA: Diagnosis not present

## 2022-01-06 LAB — KAPPA/LAMBDA LIGHT CHAINS
Kappa free light chain: 210 mg/L — ABNORMAL HIGH (ref 3.3–19.4)
Kappa, lambda light chain ratio: 2.95 — ABNORMAL HIGH (ref 0.26–1.65)
Lambda free light chains: 71.3 mg/L — ABNORMAL HIGH (ref 5.7–26.3)

## 2022-01-06 NOTE — Telephone Encounter (Signed)
Called patient regarding upcoming appointments, patient is notified. 

## 2022-01-07 ENCOUNTER — Ambulatory Visit (INDEPENDENT_AMBULATORY_CARE_PROVIDER_SITE_OTHER): Payer: Medicare Other | Admitting: Internal Medicine

## 2022-01-07 ENCOUNTER — Encounter: Payer: Self-pay | Admitting: Internal Medicine

## 2022-01-07 VITALS — BP 116/78 | HR 68 | Temp 97.5°F | Ht 71.0 in | Wt 183.2 lb

## 2022-01-07 DIAGNOSIS — I480 Paroxysmal atrial fibrillation: Secondary | ICD-10-CM

## 2022-01-07 DIAGNOSIS — I639 Cerebral infarction, unspecified: Secondary | ICD-10-CM | POA: Diagnosis not present

## 2022-01-07 DIAGNOSIS — E1122 Type 2 diabetes mellitus with diabetic chronic kidney disease: Secondary | ICD-10-CM

## 2022-01-07 DIAGNOSIS — I120 Hypertensive chronic kidney disease with stage 5 chronic kidney disease or end stage renal disease: Secondary | ICD-10-CM | POA: Diagnosis not present

## 2022-01-07 DIAGNOSIS — Z23 Encounter for immunization: Secondary | ICD-10-CM

## 2022-01-07 DIAGNOSIS — N2581 Secondary hyperparathyroidism of renal origin: Secondary | ICD-10-CM

## 2022-01-07 DIAGNOSIS — G8110 Spastic hemiplegia affecting unspecified side: Secondary | ICD-10-CM | POA: Diagnosis not present

## 2022-01-07 DIAGNOSIS — R4189 Other symptoms and signs involving cognitive functions and awareness: Secondary | ICD-10-CM

## 2022-01-07 DIAGNOSIS — N186 End stage renal disease: Secondary | ICD-10-CM

## 2022-01-07 LAB — MULTIPLE MYELOMA PANEL, SERUM
Albumin SerPl Elph-Mcnc: 4 g/dL (ref 2.9–4.4)
Albumin/Glob SerPl: 1.5 (ref 0.7–1.7)
Alpha 1: 0.3 g/dL (ref 0.0–0.4)
Alpha2 Glob SerPl Elph-Mcnc: 0.7 g/dL (ref 0.4–1.0)
B-Globulin SerPl Elph-Mcnc: 0.8 g/dL (ref 0.7–1.3)
Gamma Glob SerPl Elph-Mcnc: 0.9 g/dL (ref 0.4–1.8)
Globulin, Total: 2.7 g/dL (ref 2.2–3.9)
IgA: 82 mg/dL — ABNORMAL LOW (ref 90–386)
IgG (Immunoglobin G), Serum: 951 mg/dL (ref 603–1613)
IgM (Immunoglobulin M), Srm: 33 mg/dL (ref 20–172)
Total Protein ELP: 6.7 g/dL (ref 6.0–8.5)

## 2022-01-07 NOTE — Progress Notes (Signed)
?Rich Brave Llittleton,acting as a Education administrator for [name], MD.,have documented all relevant documentation on the behalf of [name], MD,as directed by  [name], MD while in the presence of [name], MD.  ?This visit occurred during the SARS-CoV-2 public health emergency.  Safety protocols were in place, including screening questions prior to the visit, additional usage of staff PPE, and extensive cleaning of exam room while observing appropriate contact time as indicated for disinfecting solutions. ? ?Subjective:  ?  ? Patient ID: Isaiah [name] , male    DOB: Jan 11, 1966 , 56 y.o.   MRN: [id number] ? ? ?Chief Complaint  ?Patient presents with  ? Diabetes  ? Hypertension  ? ? ?HPI ? ?Patient presents today for a blood pressure and diabetes f/u.  He is accompanied by his wife today. He is also followed by Dr.Balan. He offers no new complaints at this time. He is still participating in dialysis three days per week.  ? ?Diabetes ?He presents for his follow-up diabetic visit. He has type 2 diabetes mellitus. His disease course has been stable. There are no hypoglycemic associated symptoms. Pertinent negatives for diabetes include no blurred vision, no polydipsia, no polyphagia and no polyuria. There are no hypoglycemic complications. Diabetic complications include nephropathy. Risk factors for coronary artery disease include diabetes mellitus, dyslipidemia, hypertension, male sex and obesity. He is following a diabetic diet.  ?Hypertension ?This is a chronic problem. The current episode started more than 1 year ago. The problem has been gradually improving since onset. The problem is controlled. Pertinent negatives include no anxiety or blurred vision. The current treatment provides moderate improvement.   ? ?Past Medical History:  ?Diagnosis Date  ? Abnormality of gait 09/13/2019  ? Acute metabolic encephalopathy 07/62/2633  ? Allergy 06/27/2019  ? AMS (altered mental status) 10/05/2019  ?  Anaphylactic shock, unspecified 07/30/2021  ? Aphasia due to acute cerebrovascular accident 02/28/2016  ? Aspiration pneumonia 06/09/2019  ? Bacteremia   ? Bilateral kidney stones 05/31/2019  ? Chronic intracranial subdural hematoma 06/09/2019  ? COPD (chronic obstructive pulmonary disease) 11/11/2016  ? CVA (cerebral vascular accident)   ? 2017 and 2021; R-MCA, L-MCA, PCA and bilateral cerebellar complicated by DVT/PE  ? Delusional thoughts, secondary to stroke 2021  ? Diabetic retinopathy   ? PDR OU  ? Dysphagia, oropharyngeal phase 07/05/2019  ? ESRD (end stage renal disease) on dialysis   ? Essential hypertension 06/09/2019  ? 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 GERD (gastroesophageal reflux disease) 11/11/2016  ? GIB (gastrointestinal bleeding)   ? Recurrent episodes- 09/2014, 09/2015 and 05/2016  ? Gout   ? Hammer toe, acquired 03/03/2016  ? Headache 09/28/2019  ? Hemiparesis affecting right side as late effect of cerebrovascular accident 02/28/2016  ? History of DVT (deep vein thrombosis) 01/08/2016  ? History of echocardiogram 08/2021  ? mod septal LVH o/w mild conc LVH, EF 55-60, no RWMA, Gr 2 DD, normal RVSF, severe LAE, trivial MR  ? History of ischemic left MCA stroke 01/08/2016  ? History of recent blood transfusion 10/27/2014  ? 2 Units PRBC's  ? Hyperkalemia 07/2011  ? Hyperparathyroidism, secondary renal 11/11/2016  ? Hypocalcemia 12/26/2015  ? Hypotension 11/11/2016  ? Hypothyroidism 07/15/2019  ? Iron deficiency anemia, unspecified 01/22/2017  ? Ischemic colitis   ? 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  ? Labile blood glucose   ? Labile blood pressure   ? Leukocytosis 01/08/2016  ? Multiple myeloma 05/17/2018  ? Onychomycosis due  to dermatophyte 03/03/2016  ? Other specified coagulation defects 11/02/2015  ? PAF (paroxysmal atrial fibrillation)   ? Partial idiopathic epilepsy with seizures of localized onset, intractable, without status epilepticus 11/02/2017  ? Patent foramen ovale  11/11/2016  ? Proteus infection 05/28/2019  ? Pruritus, unspecified 11/02/2015  ? Pulmonary hypertension 11/11/2016  ? ? By echo 09/2014  RHC on 04/21/19 for further investigation of the nature of his elevated RVSP estimates on non-invasive imaging. His RHC (performed on a Thursday following HD on a Wednesday) demonstrated:  RA 10, RV 67/9, mPA 38, PCWP 19, CO 9.6, CI 4.5, PVR 2.0.   These findings are consistent with mild elevation in intracardiac filling pressures in the setting of high cardiac output in the setti  ? Right sided weakness 01/08/2016  ? Sepsis   ? Shortness of breath 11/02/2015  ? Sickle cell-thalassemia disease   ? a. Sickle cell trait.  ? Spastic hemiparesis 07/12/2019  ? Type 2 diabetes mellitus with hyperglycemia, without long-term current use of insulin   ? Ulcerative colitis 03/15/2020  ? Vascular dementia with behavioral disturbance ([place]) 11/05/2021  ? VTE (venous thromboembolism) 11/11/2016  ? DVT + PE  ?  ? ?Family History  ?Problem Relation Age of Onset  ? Hypertension Mother   ? Diabetes Mother   ? Hypertension Father   ? Stroke Father   ? Sickle cell anemia Brother   ? Sickle cell anemia Brother   ? Diabetes type I Brother   ? Kidney disease Brother   ? Multiple myeloma Maternal Aunt   ? Mental illness Maternal Aunt   ?     visual hallucinations of unknown origin  ? ? ? ?Current Outpatient Medications:  ?  acetaminophen (TYLENOL) 325 MG tablet, Take 1-2 tablets (325-650 mg total) by mouth every 4 (four) hours as needed for mild pain., Disp:  , Rfl:  ?  albuterol (PROAIR HFA) 108 (90 Base) MCG/ACT inhaler, Inhale two puffs every 4-6 hours if needed for cough or wheeze, Disp: 6.7 g, Rfl: 2 ?  Alcohol Swabs (SM ALCOHOL PREP) 70 % PADS, , Disp: , Rfl:  ?  atorvastatin (LIPITOR) 20 MG tablet, TAKE 1 TABLET BY MOUTH ONCE A DAY, Disp: 90 tablet, Rfl: 2 ?  AURYXIA 1 GM 210 MG(Fe) tablet, Take 210 mg by mouth every evening., Disp: , Rfl:  ?  calcitRIOL (ROCALTROL) 0.25 MCG capsule, Take 0.25 mcg by  mouth daily., Disp: , Rfl:  ?  Cetirizine HCl 10 MG CAPS, Take 1 capsule by mouth daily. , Disp: , Rfl:  ?  Cholecalciferol (D3-1000 PO), Take 1 tablet by mouth See admin instructions. Take one tablet by mouth on Monday,Wednesday and Fridays, Disp: , Rfl:  ?  escitalopram (LEXAPRO) 10 MG tablet, Take 1 tablet (10 mg total) by mouth daily., Disp: 90 tablet, Rfl: 3 ?  ferric citrate (AURYXIA) 1 GM 210 MG(Fe) tablet, Take 1 tablet by mouth 3 times a day with meals as directed, Disp: 270 tablet, Rfl: 3 ?  fluticasone (FLONASE) 50 MCG/ACT nasal spray, Place 1 spray into both nostrils as needed for allergies., Disp: , Rfl:  ?  FREESTYLE LITE test strip, , Disp: , Rfl:  ?  Glucagon, rDNA, (GLUCAGON EMERGENCY) 1 MG KIT, See admin instructions., Disp: , Rfl:  ?  glucose blood (FREESTYLE LITE) test strip, Use as directed to test blood glucose levels 3 times daily., Disp: 300 each, Rfl: 3 ?  hydrOXYzine (ATARAX) 25 MG tablet, Take 1 tablet (25 mg total)  by mouth as needed., Disp: 30 tablet, Rfl: 1 ?  Insulin Pen Needle 32G X 4 MM MISC, USE AS DIRECTED 4 TO 5 TIMES DAILY TO INJECT INSULIN, Disp: 400 each, Rfl: 6 ?  lacosamide (VIMPAT) 200 MG TABS tablet, TAKE 1 TABLET TWICE A DAY EXCEPT ON DIALYSIS DAYS M-W-F GIVE 1 TAB IN AM, 1 TAB AFTER DIALYSIS, 1 TAB IN PM., Disp: 206 tablet, Rfl: 1 ?  lamoTRIgine (LAMICTAL) 200 MG tablet, TAKE 1 TABLET BY MOUTH 2 TIMES DAILY, Disp: 180 tablet, Rfl: 3 ?  Lancets (FREESTYLE) lancets, , Disp: , Rfl:  ?  Methoxy PEG-Epoetin Beta (MIRCERA IJ), , Disp: , Rfl:  ?  metoprolol succinate (TOPROL-XL) 25 MG 24 hr tablet, Take 25 mg by mouth 2 (two) times daily., Disp: , Rfl:  ?  midodrine (PROAMATINE) 10 MG tablet, Take 1 tablet by mouth 2 times daily., Disp: 180 tablet, Rfl: 1 ?  multivitamin (RENA-VIT) TABS tablet, Take 1 tablet by mouth at bedtime., Disp: , Rfl:  ?  pantoprazole (PROTONIX) 40 MG tablet, TAKE 1 TABLET BY MOUTH ONCE DAILY, Disp: 90 tablet, Rfl: 1 ?  prochlorperazine (COMPAZINE) 10 MG  tablet, Take 10 mg by mouth every 6 (six) hours as needed for nausea., Disp: , Rfl:  ?  UNIFINE PENTIPS 31G X 8 MM MISC, USE AS DIRECTED DAILY AFTER SUPPER, Disp: 100 each, Rfl: 3 ?  clonazePAM (KLONOPIN)

## 2022-01-07 NOTE — Patient Instructions (Signed)
Exercises to do While Sitting  Exercises that you do while sitting (chair exercises) can give you many of the same benefits as full exercise. Benefits include strengthening your heart, burning calories, and keeping muscles and joints healthy. Exercise can also improve your mood and help with depression and anxiety. You may benefit from chair exercises if you are unable to do standing exercises due to: Diabetic foot pain. Obesity. Illness. Arthritis. Recovery from surgery or injury. Breathing problems. Balance problems. Another type of disability. Before starting chair exercises, check with your health care provider or a physical therapist to find out how much exercise you can tolerate and which exercises are safe for you. If your health care provider approves: Start out slowly and build up over time. Aim to work up to about 10-20 minutes for each exercise session. Make exercise part of your daily routine. Drink water when you exercise. Do not wait until you are thirsty. Drink every 10-15 minutes. Stop exercising right away if you have pain, nausea, shortness of breath, or dizziness. If you are exercising in a wheelchair, make sure to lock the wheels. Ask your health care provider whether you can do tai chi or yoga. Many positions in these mind-body exercises can be modified to do while seated. Warm-up Before starting other exercises: Sit up as straight as you can. Have your knees bent at 90 degrees, which is the shape of the capital letter "L." Keep your feet flat on the floor. Sit at the front edge of your chair, if you can. Pull in (tighten) the muscles in your abdomen and stretch your spine and neck as straight as you can. Hold this position for a few minutes. Breathe in and out evenly. Try to concentrate on your breathing, and relax your mind. Stretching Exercise A: Arm stretch Hold your arms out straight in front of your body. Bend your hands at the wrist with your fingers pointing  up, as if signaling someone to stop. Notice the slight tension in your forearms as you hold the position. Keeping your arms out and your hands bent, rotate your hands outward as far as you can and hold this stretch. Aim to have your thumbs pointing up and your pinkie fingers pointing down. Slowly repeat arm stretches for one minute as tolerated. Exercise B: Leg stretch If you can move your legs, try to "draw" letters on the floor with the toes of your foot. Write your name with one foot. Write your name with the toes of your other foot. Slowly repeat the movements for one minute as tolerated. Exercise C: Reach for the sky Reach your hands as far over your head as you can to stretch your spine. Move your hands and arms as if you are climbing a rope. Slowly repeat the movements for one minute as tolerated. Range of motion exercises Exercise A: Shoulder roll Let your arms hang loosely at your sides. Lift just your shoulders up toward your ears, then let them relax back down. When your shoulders feel loose, rotate your shoulders in backward and forward circles. Do shoulder rolls slowly for one minute as tolerated. Exercise B: March in place As if you are marching, pump your arms and lift your legs up and down. Lift your knees as high as you can. If you are unable to lift your knees, just pump your arms and move your ankles and feet up and down. March in place for one minute as tolerated. Exercise C: Seated jumping jacks Let your arms hang   down straight. Keeping your arms straight, lift them up over your head. Aim to point your fingers to the ceiling. While you lift your arms, straighten your legs and slide your heels along the floor to your sides, as wide as you can. As you bring your arms back down to your sides, slide your legs back together. If you are unable to use your legs, just move your arms. Slowly repeat seated jumping jacks for one minute as tolerated. Strengthening  exercises Exercise A: Shoulder squeeze Hold your arms straight out from your body to your sides, with your elbows bent and your fists pointed at the ceiling. Keeping your arms in the bent position, move them forward so your elbows and forearms meet in front of your face. Open your arms back out as wide as you can with your elbows still bent, until you feel your shoulder blades squeezing together. Hold for 5 seconds. Slowly repeat the movements forward and backward for one minute as tolerated. Contact a health care provider if: You have to stop exercising due to any of the following: Pain. Nausea. Shortness of breath. Dizziness. Fatigue. You have significant pain or soreness after exercising. Get help right away if: You have chest pain. You have difficulty breathing. These symptoms may represent a serious problem that is an emergency. Do not wait to see if the symptoms will go away. Get medical help right away. Call your local emergency services (911 in the [place] Do not drive yourself to the hospital. Summary Exercises that you do while sitting (chair exercises) can strengthen your heart, burn calories, and keep muscles and joints healthy. You may benefit from chair exercises if you are unable to do standing exercises due to diabetic foot pain, obesity, recovery from surgery or injury, or other conditions. Before starting chair exercises, check with your health care provider or a physical therapist to find out how much exercise you can tolerate and which exercises are safe for you. This information is not intended to replace advice given to you by your health care provider. Make sure you discuss any questions you have with your health care provider. Document Revised: 10/21/2020 Document Reviewed: 10/21/2020 [employer] Patient Education  2023 [employer]  

## 2022-01-08 ENCOUNTER — Other Ambulatory Visit: Payer: Self-pay

## 2022-01-08 ENCOUNTER — Inpatient Hospital Stay: Payer: Medicare Other | Attending: Oncology | Admitting: Oncology

## 2022-01-08 VITALS — BP 159/63 | HR 67 | Temp 98.0°F | Resp 18 | Ht 71.0 in | Wt 195.8 lb

## 2022-01-08 DIAGNOSIS — C9000 Multiple myeloma not having achieved remission: Secondary | ICD-10-CM | POA: Diagnosis not present

## 2022-01-08 DIAGNOSIS — Z992 Dependence on renal dialysis: Secondary | ICD-10-CM | POA: Diagnosis not present

## 2022-01-08 DIAGNOSIS — D631 Anemia in chronic kidney disease: Secondary | ICD-10-CM | POA: Insufficient documentation

## 2022-01-08 DIAGNOSIS — N186 End stage renal disease: Secondary | ICD-10-CM | POA: Diagnosis not present

## 2022-01-08 DIAGNOSIS — E1122 Type 2 diabetes mellitus with diabetic chronic kidney disease: Secondary | ICD-10-CM

## 2022-01-08 DIAGNOSIS — N2581 Secondary hyperparathyroidism of renal origin: Secondary | ICD-10-CM | POA: Diagnosis not present

## 2022-01-08 DIAGNOSIS — Z79899 Other long term (current) drug therapy: Secondary | ICD-10-CM | POA: Insufficient documentation

## 2022-01-08 DIAGNOSIS — Z8673 Personal history of transient ischemic attack (TIA), and cerebral infarction without residual deficits: Secondary | ICD-10-CM | POA: Diagnosis not present

## 2022-01-08 LAB — LIPID PANEL
Chol/HDL Ratio: 2.3 ratio (ref 0.0–5.0)
Cholesterol, Total: 146 mg/dL (ref 100–199)
HDL: 63 mg/dL (ref >39)
LDL Chol Calc (NIH): 68 mg/dL (ref 0–99)
Triglycerides: 79 mg/dL (ref 0–149)
VLDL Cholesterol Cal: 15 mg/dL (ref 5–40)

## 2022-01-08 LAB — HEMOGLOBIN A1C
Est. average glucose Bld gHb Est-mCnc: 114 mg/dL
Hgb A1c MFr Bld: 5.6 % (ref 4.8–5.6)

## 2022-01-08 NOTE — Progress Notes (Signed)
Hematology and Oncology Follow Up Visit ? ?[place] ?[id number] ?09-25-1965 56 y.o. ?01/08/2022 9:00 AM ? ? ? ?Principle Diagnosis: 56 year old man with IgG kappa multiple myeloma diagnosed in 2019 arising from MGUS. ? ? ?Past therapy: Velcade dexamethasone and cyclophosphamide weekly started on 05/25/2018.  Velcade is given at 1.5 mg/m? weekly, dexamethasone is 20 mg weekly and and Cytoxan is given at 650 mg total dose.  Last treatment was given in August 2020.   ? ?Therapy was held today in anticipation of high-dose chemotherapy and autologous stem cell transplant.  He did not receive stem cell transplant due to subdural hematoma and sepsis in September 2020. ? ?Velcade at 1.5 mg per metered squared subcutaneously weekly, dexamethasone 20 mg weekly, daratumumab 1800 mg subcutaneous injection.   Cycle 1 started on 08/30/2019.  Therapy concluded in June 2021. ? ? ?Daratumumab monthly maintenance started in June 2021.  Therapy discontinued in December 2021. ? ?Current therapy: Active surveillance. ? ?Interim History:  Mr. Stafford is here for a follow-up visit.  Since the last visit, he has reported no major changes in his health.  He continues to go to dialysis 3 times a week and participate in physical therapy predominantly at home.  He is still limited in his mobility related to previous strokes.  He has also some cognitive decline related to possible vascular dementia.  He denies any bone pain or pathological fractures.  He denies any recent hospitalizations or illnesses. ? ? ? ? ? ? ? ? ? ? ? ? ? ? ? ? ? ? ?Medications: Updated on review ?Current Outpatient Medications:  ?  acetaminophen (TYLENOL) 325 MG tablet, Take 1-2 tablets (325-650 mg total) by mouth every 4 (four) hours as needed for mild pain., Disp:  , Rfl:  ?  albuterol (PROAIR HFA) 108 (90 Base) MCG/ACT inhaler, Inhale two puffs every 4-6 hours if needed for cough or wheeze, Disp: 6.7 g, Rfl: 2 ?  Alcohol Swabs (SM ALCOHOL PREP) 70 % PADS, , Disp: ,  Rfl:  ?  atorvastatin (LIPITOR) 20 MG tablet, TAKE 1 TABLET BY MOUTH ONCE A DAY, Disp: 90 tablet, Rfl: 2 ?  AURYXIA 1 GM 210 MG(Fe) tablet, Take 210 mg by mouth every evening., Disp: , Rfl:  ?  calcitRIOL (ROCALTROL) 0.25 MCG capsule, Take 0.25 mcg by mouth daily., Disp: , Rfl:  ?  Cetirizine HCl 10 MG CAPS, Take 1 capsule by mouth daily. , Disp: , Rfl:  ?  Cholecalciferol (D3-1000 PO), Take 1 tablet by mouth See admin instructions. Take one tablet by mouth on Monday,Wednesday and Fridays, Disp: , Rfl:  ?  clonazePAM (KLONOPIN) 0.5 MG tablet, Take 2 tablets in morning on Monday, Wednesday, Friday before dialysis, Disp: 60 tablet, Rfl: 5 ?  escitalopram (LEXAPRO) 10 MG tablet, Take 1 tablet (10 mg total) by mouth daily., Disp: 90 tablet, Rfl: 3 ?  ferric citrate (AURYXIA) 1 GM 210 MG(Fe) tablet, Take 1 tablet by mouth 3 times a day with meals as directed, Disp: 270 tablet, Rfl: 3 ?  fluticasone (FLONASE) 50 MCG/ACT nasal spray, Place 1 spray into both nostrils as needed for allergies., Disp: , Rfl:  ?  fluticasone furoate-vilanterol (BREO ELLIPTA) 200-25 MCG/ACT AEPB, Inhale 1 puff by mouth into the lungs at the same time everyday, Disp: 60 each, Rfl: 1 ?  FREESTYLE LITE test strip, , Disp: , Rfl:  ?  Glucagon, rDNA, (GLUCAGON EMERGENCY) 1 MG KIT, See admin instructions., Disp: , Rfl:  ?  glucose blood (  FREESTYLE LITE) test strip, Use as directed to test blood glucose levels 3 times daily., Disp: 300 each, Rfl: 3 ?  hydrOXYzine (ATARAX) 25 MG tablet, Take 1 tablet (25 mg total) by mouth as needed., Disp: 30 tablet, Rfl: 1 ?  insulin glargine-yfgn (SEMGLEE, YFGN,) 100 UNIT/ML Pen, Inject 6 units under the skin once daily (discard pen after 28 days from first use), Disp: 3 mL, Rfl: 0 ?  Insulin Glargine-yfgn (SEMGLEE, YFGN,) 100 UNIT/ML SOPN, Inject 6 units subcutaneously once a day (Patient not taking: Reported on 01/07/2022), Disp: 9 mL, Rfl: 4 ?  Insulin Pen Needle 32G X 4 MM MISC, USE AS DIRECTED 4 TO 5 TIMES DAILY  TO INJECT INSULIN, Disp: 400 each, Rfl: 6 ?  lacosamide (VIMPAT) 200 MG TABS tablet, TAKE 1 TABLET TWICE A DAY EXCEPT ON DIALYSIS DAYS M-W-F GIVE 1 TAB IN AM, 1 TAB AFTER DIALYSIS, 1 TAB IN PM., Disp: 206 tablet, Rfl: 1 ?  lamoTRIgine (LAMICTAL) 200 MG tablet, TAKE 1 TABLET BY MOUTH 2 TIMES DAILY, Disp: 180 tablet, Rfl: 3 ?  Lancets (FREESTYLE) lancets, , Disp: , Rfl:  ?  Methoxy PEG-Epoetin Beta (MIRCERA IJ), , Disp: , Rfl:  ?  metoprolol succinate (TOPROL-XL) 25 MG 24 hr tablet, Take 25 mg by mouth 2 (two) times daily., Disp: , Rfl:  ?  midodrine (PROAMATINE) 10 MG tablet, Take 1 tablet by mouth 2 times daily., Disp: 180 tablet, Rfl: 1 ?  multivitamin (RENA-VIT) TABS tablet, Take 1 tablet by mouth at bedtime., Disp: , Rfl:  ?  pantoprazole (PROTONIX) 40 MG tablet, TAKE 1 TABLET BY MOUTH ONCE DAILY, Disp: 90 tablet, Rfl: 1 ?  prochlorperazine (COMPAZINE) 10 MG tablet, Take 10 mg by mouth every 6 (six) hours as needed for nausea., Disp: , Rfl:  ?  UNIFINE PENTIPS 31G X 8 MM MISC, USE AS DIRECTED DAILY AFTER SUPPER, Disp: 100 each, Rfl: 3 ? ?Allergies:  ?Allergies  ?Allergen Reactions  ? Codeine Swelling  ? Codeine Rash and Other (See Comments)  ?  Unknown reaction (patient says it was more serious than just a rash, but he can't remember what happened) ?  ? ? ? ? ?Physical Exam: ? ? ? ? ? ? ? ? ?Blood pressure (!) 159/63, pulse 67, temperature 98 ?F (36.7 ?C), temperature source Temporal, resp. rate 18, height 5' 11"  (1.803 m), weight 195 lb 12.8 oz (88.8 kg), SpO2 98 %. ? ? ? ? ? ? ? ? ? ?ECOG: 2 ? ? ? ?General appearance: Comfortable appearing without any discomfort ?Head: Normocephalic without any trauma ?Oropharynx: Mucous membranes are moist and pink without any thrush or ulcers. ?Eyes: Pupils are equal and round reactive to light. ?Lymph nodes: No cervical, supraclavicular, inguinal or axillary lymphadenopathy.   ?Heart:regular rate and rhythm.  S1 and S2 without leg edema. ?Lung: Clear without any rhonchi or  wheezes.  No dullness to percussion. ?Abdomin: Soft, nontender, nondistended with good bowel sounds.  No hepatosplenomegaly. ?Musculoskeletal: No joint deformity or effusion.  Full range of motion noted. ?Neurological: No deficits noted on motor, sensory and deep tendon reflex exam. ?Skin: No petechial rash or dryness.  Appeared moist.  ? ? ? ? ? ? ? ? ? ? ? ? ? ? ? ? ? ? ? ? ? ? ?Lab Results: ?Lab Results  ?Component Value Date  ? WBC 9.3 01/03/2022  ? HGB 11.2 (L) 01/03/2022  ? HCT 30.3 (L) 01/03/2022  ? MCV 85.1 01/03/2022  ? PLT 227 01/03/2022  ? ?  Chemistry   ?   ?Component Value Date/Time  ? NA 144 01/03/2022 0918  ? NA 141 07/19/2020 0000  ? NA 138 07/03/2017 0813  ? K 3.9 01/03/2022 0918  ? K 4.2 07/03/2017 0813  ? CL 99 01/03/2022 0918  ? CO2 34 (H) 01/03/2022 8333  ? CO2 27 07/03/2017 0813  ? BUN 38 (H) 01/03/2022 8329  ? BUN 65 (A) 08/15/2020 0000  ? BUN 46.4 (H) 07/03/2017 0813  ? CREATININE 9.01 (HH) 01/03/2022 1916  ? CREATININE 10.9 (HH) 07/03/2017 0813  ? GLU 162 08/15/2020 0000  ?    ?Component Value Date/Time  ? CALCIUM 10.4 (H) 01/03/2022 6060  ? CALCIUM 9.3 07/03/2017 0813  ? ALKPHOS 87 01/03/2022 0918  ? ALKPHOS 77 07/03/2017 0813  ? AST 13 (L) 01/03/2022 0459  ? AST 16 07/03/2017 0813  ? ALT 14 01/03/2022 0918  ? ALT 14 07/03/2017 0813  ? BILITOT 0.9 01/03/2022 0918  ? BILITOT 0.93 07/03/2017 0813  ?  ? ? ? Latest Reference Range & Units 02/12/21 11:58 06/20/21 09:34 09/26/21 08:33 01/03/22 09:18  ?Kappa free light chain 3.3 - 19.4 mg/L 183.9 (H) 331.6 (H) 255.0 (H) 210.0 (H)  ?Lambda free light chains 5.7 - 26.3 mg/L 77.2 (H) 121.8 (H) 91.7 (H) 71.3 (H)  ?Kappa, lambda light chain ratio 0.26 - 1.65  2.38 (H) 2.72 (H) 2.78 (H) 2.95 (H)  ?(H): Data is abnormally high ? ? ?Impression and Plan: ? ?56 year old man with: ? ?1.  IgG kappa multiple myeloma arising from MGUS noted in 2019. ? ?The natural course of this disease was reviewed today in detail with the patient and his wife.  He is  currently off treatment and remains on active surveillance.  Risks and benefits of restarting treatment and different salvage therapy options were reviewed at this time.  Given his overall health status and multip

## 2022-01-09 ENCOUNTER — Telehealth: Payer: Medicare Other

## 2022-01-10 ENCOUNTER — Other Ambulatory Visit: Payer: Self-pay | Admitting: Neurology

## 2022-01-10 DIAGNOSIS — D631 Anemia in chronic kidney disease: Secondary | ICD-10-CM | POA: Diagnosis not present

## 2022-01-10 DIAGNOSIS — N186 End stage renal disease: Secondary | ICD-10-CM | POA: Diagnosis not present

## 2022-01-10 DIAGNOSIS — N2581 Secondary hyperparathyroidism of renal origin: Secondary | ICD-10-CM | POA: Diagnosis not present

## 2022-01-10 DIAGNOSIS — Z992 Dependence on renal dialysis: Secondary | ICD-10-CM | POA: Diagnosis not present

## 2022-01-11 ENCOUNTER — Other Ambulatory Visit (HOSPITAL_COMMUNITY): Payer: Self-pay

## 2022-01-12 ENCOUNTER — Other Ambulatory Visit (HOSPITAL_COMMUNITY): Payer: Self-pay

## 2022-01-13 ENCOUNTER — Other Ambulatory Visit (HOSPITAL_COMMUNITY): Payer: Self-pay

## 2022-01-13 DIAGNOSIS — N2581 Secondary hyperparathyroidism of renal origin: Secondary | ICD-10-CM | POA: Diagnosis not present

## 2022-01-13 DIAGNOSIS — N186 End stage renal disease: Secondary | ICD-10-CM | POA: Diagnosis not present

## 2022-01-13 DIAGNOSIS — Z992 Dependence on renal dialysis: Secondary | ICD-10-CM | POA: Diagnosis not present

## 2022-01-13 DIAGNOSIS — E1065 Type 1 diabetes mellitus with hyperglycemia: Secondary | ICD-10-CM | POA: Diagnosis not present

## 2022-01-13 DIAGNOSIS — D631 Anemia in chronic kidney disease: Secondary | ICD-10-CM | POA: Diagnosis not present

## 2022-01-13 MED ORDER — INSULIN LISPRO (1 UNIT DIAL) 100 UNIT/ML (KWIKPEN)
PEN_INJECTOR | SUBCUTANEOUS | 6 refills | Status: AC
Start: 2022-01-13 — End: ?
  Filled 2022-01-13: qty 9, 75d supply, fill #0
  Filled 2022-03-02 – 2022-05-01 (×2): qty 9, 75d supply, fill #1

## 2022-01-13 MED ORDER — CLONAZEPAM 0.5 MG PO TABS
ORAL_TABLET | ORAL | 5 refills | Status: AC
Start: 2022-01-13 — End: ?
  Filled 2022-01-13: qty 36, 30d supply, fill #0
  Filled 2022-03-02: qty 36, 30d supply, fill #1
  Filled 2022-03-30: qty 36, 42d supply, fill #2
  Filled 2022-04-20 – 2022-05-01 (×2): qty 36, 42d supply, fill #3

## 2022-01-13 MED ORDER — INSULIN GLARGINE-YFGN 100 UNIT/ML ~~LOC~~ SOPN
PEN_INJECTOR | SUBCUTANEOUS | 11 refills | Status: AC
Start: 2022-01-13 — End: ?
  Filled 2022-01-13: qty 3, 28d supply, fill #0
  Filled 2022-03-02 – 2022-05-01 (×2): qty 3, 28d supply, fill #1

## 2022-01-13 MED ORDER — INSULIN GLARGINE-YFGN 100 UNIT/ML ~~LOC~~ SOPN
PEN_INJECTOR | SUBCUTANEOUS | 12 refills | Status: DC
  Filled 2022-01-13: qty 3, 28d supply, fill #0
  Filled 2022-03-02: qty 3, 50d supply, fill #0

## 2022-01-14 ENCOUNTER — Other Ambulatory Visit: Payer: Self-pay | Admitting: Internal Medicine

## 2022-01-14 ENCOUNTER — Other Ambulatory Visit (HOSPITAL_COMMUNITY): Payer: Self-pay

## 2022-01-15 ENCOUNTER — Other Ambulatory Visit (HOSPITAL_COMMUNITY): Payer: Self-pay

## 2022-01-15 ENCOUNTER — Ambulatory Visit (INDEPENDENT_AMBULATORY_CARE_PROVIDER_SITE_OTHER): Payer: 59

## 2022-01-15 ENCOUNTER — Telehealth: Payer: Medicare Other

## 2022-01-15 DIAGNOSIS — E1122 Type 2 diabetes mellitus with diabetic chronic kidney disease: Secondary | ICD-10-CM

## 2022-01-15 DIAGNOSIS — D631 Anemia in chronic kidney disease: Secondary | ICD-10-CM | POA: Diagnosis not present

## 2022-01-15 DIAGNOSIS — N2581 Secondary hyperparathyroidism of renal origin: Secondary | ICD-10-CM | POA: Diagnosis not present

## 2022-01-15 DIAGNOSIS — F01518 Vascular dementia, unspecified severity, with other behavioral disturbance: Secondary | ICD-10-CM

## 2022-01-15 DIAGNOSIS — Z992 Dependence on renal dialysis: Secondary | ICD-10-CM | POA: Diagnosis not present

## 2022-01-15 DIAGNOSIS — C9000 Multiple myeloma not having achieved remission: Secondary | ICD-10-CM

## 2022-01-15 DIAGNOSIS — N186 End stage renal disease: Secondary | ICD-10-CM | POA: Diagnosis not present

## 2022-01-15 MED ORDER — FLUTICASONE FUROATE-VILANTEROL 200-25 MCG/ACT IN AEPB
INHALATION_SPRAY | RESPIRATORY_TRACT | 1 refills | Status: DC
  Filled 2022-01-15: qty 60, 30d supply, fill #0
  Filled 2022-03-02: qty 60, 30d supply, fill #1

## 2022-01-15 NOTE — Chronic Care Management (AMB) (Signed)
?Chronic Care Management  ? ?CCM RN Visit Note ? ?01/15/2022 ?Name: Isaiah [name] MRN: [id number] DOB: 12/31/1965 ? ?Subjective: ?Isaiah [name] is a 56 y.o. year old male who is a primary care patient of Sanders, Robyn, MD. The care management team was consulted for assistance with disease management and care coordination needs.   ? ?Engaged with patient by telephone for follow up visit in response to provider referral for case management and/or care coordination services.  ? ?Consent to Services:  ?The patient was given information about Chronic Care Management services, agreed to services, and gave verbal consent prior to initiation of services.  Please see initial visit note for detailed documentation.  ? ?Patient agreed to services and verbal consent obtained.  ? ?Assessment: Review of patient past medical history, allergies, medications, health status, including review of consultants reports, laboratory and other test data, was performed as part of comprehensive evaluation and provision of chronic care management services.  ? ?SDOH (Social Determinants of Health) assessments and interventions performed:  Yes, no acute changes ? ?CCM Care Plan ? ?Allergies  ?Allergen Reactions  ? Codeine Swelling  ? Codeine Rash and Other (See Comments)  ?  Unknown reaction (patient says it was more serious than just a rash, but he can't remember what happened) ?  ? ? ?Outpatient Encounter Medications as of 01/15/2022  ?Medication Sig  ? acetaminophen (TYLENOL) 325 MG tablet Take 1-2 tablets (325-650 mg total) by mouth every 4 (four) hours as needed for mild pain.  ? albuterol (PROAIR HFA) 108 (90 Base) MCG/ACT inhaler Inhale two puffs every 4-6 hours if needed for cough or wheeze  ? Alcohol Swabs (SM ALCOHOL PREP) 70 % PADS   ? atorvastatin (LIPITOR) 20 MG tablet TAKE 1 TABLET BY MOUTH ONCE A DAY  ? AURYXIA 1 GM 210 MG(Fe) tablet Take 210 mg by mouth every evening.  ? calcitRIOL (ROCALTROL) 0.25 MCG capsule Take 0.25 mcg by  mouth daily.  ? Cetirizine HCl 10 MG CAPS Take 1 capsule by mouth daily.   ? Cholecalciferol (D3-1000 PO) Take 1 tablet by mouth See admin instructions. Take one tablet by mouth on Monday,Wednesday and Fridays  ? clonazePAM (KLONOPIN) 0.5 MG tablet Take 2 tablets in morning on Monday, Wednesday, Friday before dialysis  ? escitalopram (LEXAPRO) 10 MG tablet Take 1 tablet (10 mg total) by mouth daily.  ? ferric citrate (AURYXIA) 1 GM 210 MG(Fe) tablet Take 1 tablet by mouth 3 times a day with meals as directed  ? fluticasone (FLONASE) 50 MCG/ACT nasal spray Place 1 spray into both nostrils as needed for allergies.  ? fluticasone furoate-vilanterol (BREO ELLIPTA) 200-25 MCG/ACT AEPB Inhale 1 puff by mouth into the lungs at the same time everyday  ? FREESTYLE LITE test strip   ? Glucagon, rDNA, (GLUCAGON EMERGENCY) 1 MG KIT See admin instructions.  ? glucose blood (FREESTYLE LITE) test strip Use as directed to test blood glucose levels 3 times daily.  ? hydrOXYzine (ATARAX) 25 MG tablet Take 1 tablet (25 mg total) by mouth as needed.  ? insulin glargine-yfgn (SEMGLEE, YFGN,) 100 UNIT/ML Pen Inject 6 units under the skin once daily (discard pen after 28 days from first use)  ? insulin glargine-yfgn (SEMGLEE, YFGN,) 100 UNIT/ML Pen Inject 6 units under the skin once daily (discard pen after 28 days from first use)  ? Insulin Glargine-yfgn (SEMGLEE, YFGN,) 100 UNIT/ML SOPN Inject 6 units subcutaneously once a day (Patient not taking: Reported on 01/07/2022)  ? insulin lispro (  HUMALOG KWIKPEN) 100 UNIT/ML KwikPen Inject 3 to 4 units under the skin three times daily as needed  ? Insulin Pen Needle 32G X 4 MM MISC USE AS DIRECTED 4 TO 5 TIMES DAILY TO INJECT INSULIN  ? lacosamide (VIMPAT) 200 MG TABS tablet TAKE 1 TABLET TWICE A DAY EXCEPT ON DIALYSIS DAYS M-W-F GIVE 1 TAB IN AM, 1 TAB AFTER DIALYSIS, 1 TAB IN PM.  ? lamoTRIgine (LAMICTAL) 200 MG tablet TAKE 1 TABLET BY MOUTH 2 TIMES DAILY  ? Lancets (FREESTYLE) lancets   ?  Methoxy PEG-Epoetin Beta (MIRCERA IJ)   ? metoprolol succinate (TOPROL-XL) 25 MG 24 hr tablet Take 25 mg by mouth 2 (two) times daily.  ? midodrine (PROAMATINE) 10 MG tablet Take 1 tablet by mouth 2 times daily.  ? multivitamin (RENA-VIT) TABS tablet Take 1 tablet by mouth at bedtime.  ? pantoprazole (PROTONIX) 40 MG tablet TAKE 1 TABLET BY MOUTH ONCE DAILY  ? prochlorperazine (COMPAZINE) 10 MG tablet Take 10 mg by mouth every 6 (six) hours as needed for nausea.  ? UNIFINE PENTIPS 31G X 8 MM MISC USE AS DIRECTED DAILY AFTER SUPPER  ? ?No facility-administered encounter medications on file as of 01/15/2022.  ? ? ?Patient Active Problem List  ? Diagnosis Date Noted  ? Ischemic colitis   ? Diabetic retinopathy 11/05/2021  ? Sickle cell-thalassemia disease 11/05/2021  ? Vascular dementia with behavioral disturbance (HCC) 11/05/2021  ? Delusional thoughts, secondary to stroke   ? Anaphylactic shock, unspecified 07/30/2021  ? Ulcerative colitis 03/15/2020  ? AMS (altered mental status) 10/05/2019  ? Acute metabolic encephalopathy 10/04/2019  ? Headache 09/28/2019  ? Abnormality of gait 09/13/2019  ? Hypothyroidism 07/15/2019  ? Spastic hemiparesis 07/12/2019  ? Dysphagia, oropharyngeal phase 07/05/2019  ? CVA (cerebral vascular accident)   ? Allergy 06/27/2019  ? PAF (paroxysmal atrial fibrillation)   ? Bacteremia   ? Labile blood pressure   ? Labile blood glucose   ? Type 2 diabetes mellitus with hyperglycemia, without long-term current use of insulin   ? Chronic intracranial subdural hematoma 06/09/2019  ? Essential hypertension 06/09/2019  ? Aspiration pneumonia 06/09/2019  ? Proteus infection 05/28/2019  ? Multiple myeloma 05/17/2018  ? Partial idiopathic epilepsy with seizures of localized onset, intractable, without status epilepticus 11/02/2017  ? Iron deficiency anemia 01/22/2017  ? COPD (chronic obstructive pulmonary disease) 11/11/2016  ? Gastroesophageal reflux disease 11/11/2016  ? IVC (inferior vena cava  obstruction) 11/11/2016  ? Obesity (BMI 30-39.9) 11/11/2016  ? Pulmonary hypertension 11/11/2016  ? Patent foramen ovale 11/11/2016  ? Hyperparathyroidism, secondary renal 11/11/2016  ? VTE (venous thromboembolism) 11/11/2016  ? Hypotension 11/11/2016  ? Onychomycosis due to dermatophyte 03/03/2016  ? Hammer toe, acquired 03/03/2016  ? Hemiparesis affecting right side as late effect of cerebrovascular accident 02/28/2016  ? Aphasia due to acute cerebrovascular accident 02/28/2016  ? History of ischemic left MCA stroke 01/08/2016  ? Right sided weakness 01/08/2016  ? Leukocytosis 01/08/2016  ? History of DVT (deep vein thrombosis) 01/08/2016  ? 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 Hypocalcemia 12/26/2015  ? Long term (current) use of anticoagulants 11/07/2015  ? Other specified coagulation defects 11/02/2015  ? Pruritus, unspecified 11/02/2015  ? Shortness of breath 11/02/2015  ? ESRD (end stage renal disease) on dialysis   ? Hyperkalemia 07/2011  ? ? ?Conditions to be addressed/monitored: DM with ESRD, Multiple Myeloma, Vascular dementia with behavior disturbances ? ?Care Plan : RN Care Manager Plan of Care  ?Updates made by ,    L, RN since 01/15/2022 12:00 AM  ?  ? ?Problem: No plan of care established for management of chronic disease states (DM with ESRD, Multiple Myeloma, Vascular dementia with behavior disturbances)   ?Priority: High  ?  ? ?Long-Range Goal: Development of plan of care for chronic disease states (DM with ESRD, Multiple Myeloma, Vascular dementia with behavior disturbances)   ?Start Date: 08/21/2021  ?Expected End Date: 08/21/2022  ?Recent Progress: On track  ?Priority: High  ?Note:   ?Current Barriers:  ?Knowledge Deficits related to plan of care for management of DM with ESRD, Multiple Myeloma, Vascular dementia with behavior disturbances  ?Chronic Disease Management support and education needs related to DM with ESRD, Multiple Myeloma, Vascular dementia with behavior disturbances   ?Cognitive deficits with behavior changes  ? ?RNCM Clinical Goal(s):  ?Patient will continue to work with RN Care Manager to address care management and care coordination needs related to  DM with ESRD, Multiple

## 2022-01-15 NOTE — Patient Instructions (Signed)
Visit Information ? ?Thank you for taking time to visit with me today. Please don't hesitate to contact me if I can be of assistance to you before our next scheduled telephone appointment. ? ?Following are the goals we discussed today:  ?(Copy and paste patient goals from clinical care plan here) ? ?Our next appointment is by telephone on 06/02/22 at 2:00 PM  ? ?Please call the care guide team at [phone] if you need to cancel or reschedule your appointment.  ? ?If you are experiencing a Mental Health or [place] or need someone to talk to, please call [phone]-TALK (toll free, 24 hour hotline)  ? ?Patient verbalizes understanding of instructions and care plan provided today and agrees to view in [place]. Active MyChart status confirmed with patient.   ? ?Barb Merino, RN, BSN, CCM ?Care Management Coordinator ?[facility] [facility]  ?Direct Phone: [phone] ? ? ?

## 2022-01-17 DIAGNOSIS — N186 End stage renal disease: Secondary | ICD-10-CM | POA: Diagnosis not present

## 2022-01-17 DIAGNOSIS — N2581 Secondary hyperparathyroidism of renal origin: Secondary | ICD-10-CM | POA: Diagnosis not present

## 2022-01-17 DIAGNOSIS — D631 Anemia in chronic kidney disease: Secondary | ICD-10-CM | POA: Diagnosis not present

## 2022-01-17 DIAGNOSIS — Z992 Dependence on renal dialysis: Secondary | ICD-10-CM | POA: Diagnosis not present

## 2022-01-19 DIAGNOSIS — I129 Hypertensive chronic kidney disease with stage 1 through stage 4 chronic kidney disease, or unspecified chronic kidney disease: Secondary | ICD-10-CM | POA: Insufficient documentation

## 2022-01-19 DIAGNOSIS — E1122 Type 2 diabetes mellitus with diabetic chronic kidney disease: Secondary | ICD-10-CM | POA: Insufficient documentation

## 2022-01-20 ENCOUNTER — Other Ambulatory Visit (HOSPITAL_COMMUNITY): Payer: Self-pay

## 2022-01-20 DIAGNOSIS — N186 End stage renal disease: Secondary | ICD-10-CM | POA: Diagnosis not present

## 2022-01-20 DIAGNOSIS — N2581 Secondary hyperparathyroidism of renal origin: Secondary | ICD-10-CM | POA: Diagnosis not present

## 2022-01-20 DIAGNOSIS — D631 Anemia in chronic kidney disease: Secondary | ICD-10-CM | POA: Diagnosis not present

## 2022-01-20 DIAGNOSIS — Z992 Dependence on renal dialysis: Secondary | ICD-10-CM | POA: Diagnosis not present

## 2022-01-22 DIAGNOSIS — D631 Anemia in chronic kidney disease: Secondary | ICD-10-CM | POA: Diagnosis not present

## 2022-01-22 DIAGNOSIS — N186 End stage renal disease: Secondary | ICD-10-CM | POA: Diagnosis not present

## 2022-01-22 DIAGNOSIS — N2581 Secondary hyperparathyroidism of renal origin: Secondary | ICD-10-CM | POA: Diagnosis not present

## 2022-01-22 DIAGNOSIS — Z992 Dependence on renal dialysis: Secondary | ICD-10-CM | POA: Diagnosis not present

## 2022-01-24 DIAGNOSIS — D631 Anemia in chronic kidney disease: Secondary | ICD-10-CM | POA: Diagnosis not present

## 2022-01-24 DIAGNOSIS — N186 End stage renal disease: Secondary | ICD-10-CM | POA: Diagnosis not present

## 2022-01-24 DIAGNOSIS — N2581 Secondary hyperparathyroidism of renal origin: Secondary | ICD-10-CM | POA: Diagnosis not present

## 2022-01-24 DIAGNOSIS — Z992 Dependence on renal dialysis: Secondary | ICD-10-CM | POA: Diagnosis not present

## 2022-01-27 DIAGNOSIS — Z992 Dependence on renal dialysis: Secondary | ICD-10-CM | POA: Diagnosis not present

## 2022-01-27 DIAGNOSIS — N186 End stage renal disease: Secondary | ICD-10-CM | POA: Diagnosis not present

## 2022-01-27 DIAGNOSIS — N2581 Secondary hyperparathyroidism of renal origin: Secondary | ICD-10-CM | POA: Diagnosis not present

## 2022-01-27 DIAGNOSIS — D631 Anemia in chronic kidney disease: Secondary | ICD-10-CM | POA: Diagnosis not present

## 2022-01-29 DIAGNOSIS — N2581 Secondary hyperparathyroidism of renal origin: Secondary | ICD-10-CM | POA: Diagnosis not present

## 2022-01-29 DIAGNOSIS — Z992 Dependence on renal dialysis: Secondary | ICD-10-CM | POA: Diagnosis not present

## 2022-01-29 DIAGNOSIS — N186 End stage renal disease: Secondary | ICD-10-CM | POA: Diagnosis not present

## 2022-01-29 DIAGNOSIS — D631 Anemia in chronic kidney disease: Secondary | ICD-10-CM | POA: Diagnosis not present

## 2022-01-31 DIAGNOSIS — D631 Anemia in chronic kidney disease: Secondary | ICD-10-CM | POA: Diagnosis not present

## 2022-01-31 DIAGNOSIS — Z992 Dependence on renal dialysis: Secondary | ICD-10-CM | POA: Diagnosis not present

## 2022-01-31 DIAGNOSIS — N2581 Secondary hyperparathyroidism of renal origin: Secondary | ICD-10-CM | POA: Diagnosis not present

## 2022-01-31 DIAGNOSIS — N186 End stage renal disease: Secondary | ICD-10-CM | POA: Diagnosis not present

## 2022-02-03 DIAGNOSIS — D631 Anemia in chronic kidney disease: Secondary | ICD-10-CM | POA: Diagnosis not present

## 2022-02-03 DIAGNOSIS — N2581 Secondary hyperparathyroidism of renal origin: Secondary | ICD-10-CM | POA: Diagnosis not present

## 2022-02-03 DIAGNOSIS — N186 End stage renal disease: Secondary | ICD-10-CM | POA: Diagnosis not present

## 2022-02-03 DIAGNOSIS — Z992 Dependence on renal dialysis: Secondary | ICD-10-CM | POA: Diagnosis not present

## 2022-02-04 ENCOUNTER — Other Ambulatory Visit (HOSPITAL_COMMUNITY): Payer: Self-pay

## 2022-02-04 ENCOUNTER — Encounter: Payer: Self-pay | Admitting: Neurology

## 2022-02-04 ENCOUNTER — Ambulatory Visit (INDEPENDENT_AMBULATORY_CARE_PROVIDER_SITE_OTHER): Payer: Medicare Other | Admitting: Neurology

## 2022-02-04 VITALS — BP 108/48 | HR 78 | Ht 71.0 in | Wt 192.0 lb

## 2022-02-04 DIAGNOSIS — F01518 Vascular dementia, unspecified severity, with other behavioral disturbance: Secondary | ICD-10-CM | POA: Diagnosis not present

## 2022-02-04 DIAGNOSIS — G40209 Localization-related (focal) (partial) symptomatic epilepsy and epileptic syndromes with complex partial seizures, not intractable, without status epilepticus: Secondary | ICD-10-CM | POA: Diagnosis not present

## 2022-02-04 DIAGNOSIS — I639 Cerebral infarction, unspecified: Secondary | ICD-10-CM

## 2022-02-04 MED ORDER — DONEPEZIL HCL 5 MG PO TABS
ORAL_TABLET | ORAL | 6 refills | Status: AC
  Filled 2022-02-04: qty 30, 30d supply, fill #0
  Filled 2022-03-02: qty 30, 30d supply, fill #1
  Filled 2022-03-30: qty 30, 30d supply, fill #2
  Filled 2022-04-20: qty 30, 30d supply, fill #3

## 2022-02-04 NOTE — Progress Notes (Signed)
NEUROLOGY FOLLOW UP OFFICE NOTE  Radford Pease [facility] [id number] [birth year]  HISTORY OF PRESENT ILLNESS: I had the pleasure of seeing [facility] in follow-up in the neurology clinic on 02/04/2022. He is again accompanied by his [name] who helps supplement the history today. The patient was last seen 4 months ago for seizures secondary to left MCA stroke in 7829, complicated by right subdural hematoma in 2020. He underwent Neuropsychological testing in 10/2021 indicating Vascular dementia with behavioral disturbance. Findings discussed today. Since his last visit, [name] reports the paranoid delusions are not what is used to be, he makes little comments but better than before. He is on Lexapro 2m daily. He continues to have episodes where he stares into space then his right side gets weaker. Recently he had a fall and SSunday Spillershad to call their son to help her. This occurs every 1-2 weeks. Prolonged EEGs did not show any EEG changes during similar episodes. He has not had typical seizures during HD since 05/2019 on clonazepam 174mprior to HD. He had a left temporal seizure during his EMU admission in 12/2020, he continues on Lacosamide 20058mID and Lamotrigine 200m61mD without side effects. He sleeps all the time despite sleeping well at night. He ambulates with a walker and gait belt, last fall was Monday. He denies any headaches, dizziness.    History on Initial Assessment 10/29/2017: This is a pleasant 56 y72RH man with a history of diabetes, sickle-cell thalassemia, DVT and PE off Coumadin due to GI bleed, sleep apnea, MGUS, CKD on dialysis, and strokes in multiple vascular distributions in January 2017 with subsequent seizures. Stroke workup showed left M2 superior branch occlusion, TEE with PFO, hypercoagulation studies negative. He is now on aspirin for secondary stroke prevention. He made significant improvement with right-sided weakness and speech was more fluent. He had a seizure in April 2017 and  was started on Vimpat 100mg50m. EEG reported diffuse slowing. Limited MRI brain showed multiple late subacute and chronic infarcts, and repeat imaging in May 2017 showed resolution with no new infarct. [place] and was found to be anemic with Hgb of 6.8, requiring multiple blood transfusions. Vimpat increased to 150mg 77m EEG showed mild intermittent generalized theta slowing. He had another seizure after dialysis in November 2017 and Dilantin 300mg d58m was added. His wife did not want him to be on this long term, and was switched to Lamotrigine. He had another seizure in January 2018 a few days after Dilantin was tapered off. He had another seizure in October 2018 again after dialysis. The last seizure was on 10/19/17 again after dialysis. This was slightly different because he was belligerent and agitated and his wife had to calm him down. He is taking Vimpat 150mg BI8md Lamotrigine 150mg BID67mh no side effects. He sometimes has a warning prior to the seizures. With the most recent seizure, he was standing at the kitchen table, recalls his wife picking him up, then coming to on the sofa. Seizures last 5-10 minutes, his wife describes staring off, unresponsive, then trembling/low amplitude shaking. He is tired after, with worsened right-sided weakness. He would be "pretty out of it" the next day. Majority of seizures occur right after dialysis, he would still be sitting in the dialysis unit, but the most recent seizure occurred at home an hour after dialysis. He reports BP is not an issue because his nephrologist has regulated his medications  and BP is good most of the time.    He has occasional tremors in both legs, he has a right hand tremor noted in the office today. He noticed memory changes after his stroke, but does not think memory is worse after each seizure. He wonders about his short-term memory, sometimes he has to think which is  his right or left side. He denies any olfactory/gustatory hallucinations, deja vu, rising epigastric sensation,myoclonic jerks.    Epilepsy Risk Factors:  Prior strokes in multiple vascular distributions. Otherwise he had a normal birth and early development.  There is no history of febrile convulsions, CNS infections such as meningitis/encephalitis, significant traumatic brain injury, neurosurgical procedures, or family history of seizures.   Diagnostic Data: I personally reviewed MRI brain without contrast done 01/2016 which showed left MCA infarct, right posterior temporal and occipital hemorrhagic infarct, left frontal infarct with remote blood products, remote hemorrhagic infarct in the more superior right frontal lobe, remote nonhemorrhagic lacunar infarcts in the left cerebellum.   48-hour EEG in Jan 2021 showed occasional left temporal slowing, no epileptiform discharges. Typical episode of confusion and weakness captured did not show epileptiform correlate.   EMU 12/2020: His wife continued to report episodes where he had a blank look but was out of it, having a hard time following instructions. He was admitted to the EMU from 12/31/20 to 01/04/21 where typical events were not captured. He did have a seizure with right gaze deviation followed by right head version and body turning to the right followed by left arm jerking, gaze deviation to the left that progressed to a convulsion. EEG showed left frontotemporal origin. There was one episode of confusion with no EEG change seen.   Prior AEDs: Dilantin   PAST MEDICAL HISTORY: Past Medical History:  Diagnosis Date   Abnormality of gait 69/67/8938   Acute metabolic encephalopathy 06/24/5101   Allergy 06/27/2019   AMS (altered mental status) 10/05/2019   Anaphylactic shock, unspecified 07/30/2021   Aphasia due to acute cerebrovascular accident 02/28/2016   Aspiration pneumonia 06/09/2019   Bacteremia    Bilateral kidney stones 05/31/2019    Chronic intracranial subdural hematoma 06/09/2019   COPD (chronic obstructive pulmonary disease) 11/11/2016   CVA (cerebral vascular accident)    2017 and 2021; R-MCA, L-MCA, PCA and bilateral cerebellar complicated by DVT/PE   Delusional thoughts, secondary to stroke 2021   Diabetic retinopathy    PDR OU   Dysphagia, oropharyngeal phase 07/05/2019   ESRD (end stage renal disease) on dialysis    Essential hypertension 06/09/2019   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   GERD (gastroesophageal reflux disease) 11/11/2016   GIB (gastrointestinal bleeding)    Recurrent episodes- 09/2014, 09/2015 and 05/2016   Gout    Hammer toe, acquired 03/03/2016   Headache 09/28/2019   Hemiparesis affecting right side as late effect of cerebrovascular accident 02/28/2016   History of DVT (deep vein thrombosis) 01/08/2016   History of echocardiogram 08/2021   mod septal LVH o/w mild conc LVH, EF 55-60, no RWMA, Gr 2 DD, normal RVSF, severe LAE, trivial MR   History of ischemic left MCA stroke 01/08/2016   History of recent blood transfusion 10/27/2014   2 Units PRBC's   Hyperkalemia 07/2011   Hyperparathyroidism, secondary renal 11/11/2016   Hypocalcemia 12/26/2015   Hypotension 11/11/2016   Hypothyroidism 07/15/2019   Iron deficiency anemia, unspecified 01/22/2017   Ischemic colitis    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   Labile blood glucose    Labile  blood pressure    Leukocytosis 01/08/2016   Multiple myeloma 05/17/2018   Onychomycosis due to dermatophyte 03/03/2016   Other specified coagulation defects 11/02/2015   PAF (paroxysmal atrial fibrillation)    Partial idiopathic epilepsy with seizures of localized onset, intractable, without status epilepticus 11/02/2017   Patent foramen ovale 11/11/2016   Proteus infection 05/28/2019   Pruritus, unspecified 11/02/2015   Pulmonary hypertension 11/11/2016    By echo 09/2014  RHC on 04/21/19 for further investigation of the nature of  his elevated RVSP estimates on non-invasive imaging. His RHC (performed on a Thursday following HD on a Wednesday) demonstrated:  RA 10, RV 67/9, mPA 38, PCWP 19, CO 9.6, CI 4.5, PVR 2.0.   These findings are consistent with mild elevation in intracardiac filling pressures in the setting of high cardiac output in the setti   Right sided weakness 01/08/2016   Sepsis    Shortness of breath 11/02/2015   Sickle cell-thalassemia disease    a. Sickle cell trait.   Spastic hemiparesis 07/12/2019   Type 2 diabetes mellitus with hyperglycemia, without long-term current use of insulin    Ulcerative colitis 03/15/2020   Vascular dementia with behavioral disturbance ([place]) 11/05/2021   VTE (venous thromboembolism) 11/11/2016   DVT + PE    MEDICATIONS: Current Outpatient Medications on File Prior to Visit  Medication Sig Dispense Refill   acetaminophen (TYLENOL) 325 MG tablet Take 1-2 tablets (325-650 mg total) by mouth every 4 (four) hours as needed for mild pain.     albuterol (PROAIR HFA) 108 (90 Base) MCG/ACT inhaler Inhale two puffs every 4-6 hours if needed for cough or wheeze 6.7 g 2   Alcohol Swabs (SM ALCOHOL PREP) 70 % PADS      atorvastatin (LIPITOR) 20 MG tablet TAKE 1 TABLET BY MOUTH ONCE A DAY 90 tablet 2   AURYXIA 1 GM 210 MG(Fe) tablet Take 210 mg by mouth every evening.     calcitRIOL (ROCALTROL) 0.25 MCG capsule Take 0.25 mcg by mouth daily.     Cetirizine HCl 10 MG CAPS Take 1 capsule by mouth daily.      Cholecalciferol (D3-1000 PO) Take 1 tablet by mouth See admin instructions. Take one tablet by mouth on Monday,Wednesday and Fridays     clonazePAM (KLONOPIN) 0.5 MG tablet Take 2 tablets in morning on Monday, Wednesday, Friday before dialysis 60 tablet 5   escitalopram (LEXAPRO) 10 MG tablet Take 1 tablet (10 mg total) by mouth daily. 90 tablet 3   ferric citrate (AURYXIA) 1 GM 210 MG(Fe) tablet Take 1 tablet by mouth 3 times a day with meals as directed 270 tablet 3   fluticasone  (FLONASE) 50 MCG/ACT nasal spray Place 1 spray into both nostrils as needed for allergies.     fluticasone furoate-vilanterol (BREO ELLIPTA) 200-25 MCG/ACT AEPB Inhale 1 puff by mouth into the lungs at the same time everyday 60 each 1   FREESTYLE LITE test strip      Glucagon, rDNA, (GLUCAGON EMERGENCY) 1 MG KIT See admin instructions.     glucose blood (FREESTYLE LITE) test strip Use as directed to test blood glucose levels 3 times daily. 300 each 3   hydrOXYzine (ATARAX) 25 MG tablet Take 1 tablet (25 mg total) by mouth as needed. 30 tablet 1   insulin glargine-yfgn (SEMGLEE, YFGN,) 100 UNIT/ML Pen Inject 6 units under the skin once daily (discard pen after 28 days from first use) 3 mL 11   insulin glargine-yfgn (SEMGLEE, YFGN,) 100  UNIT/ML Pen Inject 6 units under the skin once daily (discard pen after 28 days from first use) 3 mL 12   Insulin Glargine-yfgn (SEMGLEE, YFGN,) 100 UNIT/ML SOPN Inject 6 units subcutaneously once a day 9 mL 4   insulin lispro (HUMALOG KWIKPEN) 100 UNIT/ML KwikPen Inject 3 to 4 units under the skin three times daily as needed 9 mL 6   Insulin Pen Needle 32G X 4 MM MISC USE AS DIRECTED 4 TO 5 TIMES DAILY TO INJECT INSULIN 400 each 6   lacosamide (VIMPAT) 200 MG TABS tablet TAKE 1 TABLET TWICE A DAY EXCEPT ON DIALYSIS DAYS M-W-F GIVE 1 TAB IN AM, 1 TAB AFTER DIALYSIS, 1 TAB IN PM. 206 tablet 1   lamoTRIgine (LAMICTAL) 200 MG tablet TAKE 1 TABLET BY MOUTH 2 TIMES DAILY 180 tablet 3   Lancets (FREESTYLE) lancets      Methoxy PEG-Epoetin Beta (MIRCERA IJ)      metoprolol succinate (TOPROL-XL) 25 MG 24 hr tablet Take 25 mg by mouth 2 (two) times daily.     midodrine (PROAMATINE) 10 MG tablet Take 1 tablet by mouth 2 times daily. 180 tablet 1   multivitamin (RENA-VIT) TABS tablet Take 1 tablet by mouth at bedtime.     pantoprazole (PROTONIX) 40 MG tablet TAKE 1 TABLET BY MOUTH ONCE DAILY 90 tablet 1   prochlorperazine (COMPAZINE) 10 MG tablet Take 10 mg by mouth every 6  (six) hours as needed for nausea.     UNIFINE PENTIPS 31G X 8 MM MISC USE AS DIRECTED DAILY AFTER SUPPER 100 each 3   No current facility-administered medications on file prior to visit.    ALLERGIES: Allergies  Allergen Reactions   Codeine Swelling   Codeine Rash and Other (See Comments)    Unknown reaction (patient says it was more serious than just a rash, but he can't remember what happened)     FAMILY HISTORY: Family History  Problem Relation Age of Onset   Hypertension Mother    Diabetes Mother    Hypertension Father    Stroke Father    Sickle cell anemia Brother    Sickle cell anemia Brother    Diabetes type I Brother    Kidney disease Brother    Multiple myeloma Maternal Aunt    Mental illness Maternal Aunt        visual hallucinations of unknown origin    SOCIAL HISTORY: Social History   Socioeconomic History   Marital status: Married    Spouse name: [name]   Number of children: 1   Years of education: 18   Highest education level: Master's degree (e.g., MA, MS, MEng, MEd, MSW, MBA)  Occupational History   Occupation: Disability    Comment: prior ADCytogeneticist of compliance  Tobacco Use   Smoking status: Never   Smokeless tobacco: Never  Vaping Use   Vaping Use: Never used  Substance and Sexual Activity   Alcohol use: Not Currently   Drug use: Never   Sexual activity: Yes    Partners: Female  Other Topics Concern   Not on file  Social History Narrative   ** Merged History Encounter **       Pt lives in 2 story home with his wife and 1 son   Has masters degree in psychology   Currently disabled.   Right handed       Social Determinants of Health   Financial Resource Strain: Low Risk    Difficulty of Paying Living Expenses:  Not hard at all  Food Insecurity: No Food Insecurity   Worried About Charity fundraiser in the Last Year: Never true   Ran Out of Food in the Last Year: Never true  Transportation Needs: No Transportation Needs   Lack  of Transportation (Medical): No   Lack of Transportation (Non-Medical): No  Physical Activity: Sufficiently Active   Days of Exercise per Week: 3 days   Minutes of Exercise per Session: 60 min  Stress: No Stress Concern Present   Feeling of Stress : Only a little  Social Connections: Not on file  Intimate Partner Violence: Not on file     PHYSICAL EXAM: Vitals:   02/04/22 1329  BP: (!) 108/48  Pulse: 78  SpO2: 98%   General: No acute distress, flat affect. Head:  Normocephalic/atraumatic Skin/Extremities: No rash, no edema Neurological Exam: alert and awake. No aphasia or dysarthria. Attention and concentration are normal.   Cranial nerves: Pupils equal, round. Extraocular movements intact with no nystagmus. Visual fields full.  No facial asymmetry.  Motor: increased on right UE with spastic contracture of fingers on right hand. 5/5 on left UE and LE, +4/5 right UE and LE. Finger to nose testing intact on left, ataxic on right due to weakness. Gait not tested.    IMPRESSION: This is a pleasant 56 yo RH man with a history of  diabetes, sickle-cell thalassemia, DVT and PE off Coumadin due to GI bleed, sleep apnea, CKD on dialysis, strokes in multiple vascular distributions in January 2017 with subsequent seizures and vascular dementia with behavioral disturbance (paranoid delusions). No convulsive seizures since 05/2019 since starting clonazepam 49m prior to HD. Continue Lacosamide 2076mBID, Lamotrigine 20059mID. Paranoia appears less, continue Lexapro 38m77mily. We discussed Neuropsychological testing results, we discussed starting Donepezil 5mg 31mly, including side effects and expectations. This may help with behaviors as well. Continue close supervision. Follow-up in 4 months, call for any changes.   Thank you for allowing me to participate in his care.  Please do not hesitate to call for any questions or concerns.    KarenEllouise Newer.   CC: Dr. SandeBaird Cancer

## 2022-02-04 NOTE — Patient Instructions (Signed)
Good to see you.  Start Donepezil 20m: take 1/2 tablet daily for 2 weeks, then increase to 1 tablet daily  2. Continue all your medications, let me know if you need refills  3. Follow-up in 4 months, call for any changes   Seizure Precautions: 1. If medication has been prescribed for you to prevent seizures, take it exactly as directed.  Do not stop taking the medicine without talking to your doctor first, even if you have not had a seizure in a long time.   2. Avoid activities in which a seizure would cause danger to yourself or to others.  Don't operate dangerous machinery, swim alone, or climb in high or dangerous places, such as on ladders, roofs, or girders.  Do not drive unless your doctor says you may.  3. If you have any warning that you may have a seizure, lay down in a safe place where you can't hurt yourself.    4.  No driving for 6 months from last seizure, [facility]   Please refer to the following link on the EProspectwebsite for more information: http://[email or web address]/[email or web address]/drivingu.cfm   5.  Maintain good sleep hygiene. Avoid alcohol.  6.  Contact your doctor if you have any problems that may be related to the medicine you are taking.  7.  Call 911 and bring the patient back to the ED if:        A.  The seizure lasts longer than 5 minutes.       B.  The patient doesn't awaken shortly after the seizure  C.  The patient has new problems such as difficulty seeing, speaking or moving  D.  The patient was injured during the seizure  E.  The patient has a temperature over 102 F (39C)  F.  The patient vomited and now is having trouble breathing         FALL PRECAUTIONS: Be cautious when walking. Scan the area for obstacles that may increase the risk of trips and falls. When getting up in the mornings, sit up at the edge of the bed for a few minutes before getting out of bed. Consider elevating the  bed at the head end to avoid drop of blood pressure when getting up. Walk always in a well-lit room (use night lights in the walls). Avoid area rugs or power cords from appliances in the middle of the walkways. Use a walker or a cane if necessary and consider physical therapy for balance exercise. Get your eyesight checked regularly.  HOME SAFETY: Consider the safety of the kitchen when operating appliances like stoves, microwave oven, and blender. Consider having supervision and share cooking responsibilities until no longer able to participate in those. Accidents with firearms and other hazards in the house should be identified and addressed as well.  ABILITY TO BE LEFT ALONE: If patient is unable to contact 911 operator, consider using LifeLine, or when the need is there, arrange for someone to stay with patients. Smoking is a fire hazard, consider supervision or cessation. Risk of wandering should be assessed by caregiver and if detected at any point, supervision and safe proof recommendations should be instituted.   RECOMMENDATIONS FOR ALL PATIENTS WITH MEMORY PROBLEMS: 1. Continue to exercise (Recommend 30 minutes of walking everyday, or 3 hours every week) 2. Increase social interactions - continue going to [place] enjoy social gatherings with friends and family 3. Eat healthy, avoid fried foods and eat more  fruits and vegetables 4. Maintain adequate blood pressure, blood sugar, and blood cholesterol level. Reducing the risk of stroke and cardiovascular disease also helps promoting better memory. 5. Avoid stressful situations. Live a simple life and avoid aggravations. Organize your time and prepare for the next day in anticipation. 6. Sleep well, avoid any interruptions of sleep and avoid any distractions in the bedroom that may interfere with adequate sleep quality 7. Avoid sugar, avoid sweets as there is a strong link between excessive sugar intake, diabetes, and cognitive impairment The  Mediterranean diet has been shown to help patients reduce the risk of progressive memory disorders and reduces cardiovascular risk. This includes eating fish, eat fruits and green leafy vegetables, nuts like almonds and hazelnuts, walnuts, and also use olive oil. Avoid fast foods and fried foods as much as possible. Avoid sweets and sugar as sugar use has been linked to worsening of memory function.  There is always a concern of gradual progression of memory problems. If this is the case, then we may need to adjust level of care according to patient needs. Support, both to the patient and caregiver, should then be put into place.

## 2022-02-05 DIAGNOSIS — N186 End stage renal disease: Secondary | ICD-10-CM | POA: Diagnosis not present

## 2022-02-05 DIAGNOSIS — D631 Anemia in chronic kidney disease: Secondary | ICD-10-CM | POA: Diagnosis not present

## 2022-02-05 DIAGNOSIS — F01518 Vascular dementia, unspecified severity, with other behavioral disturbance: Secondary | ICD-10-CM

## 2022-02-05 DIAGNOSIS — E1122 Type 2 diabetes mellitus with diabetic chronic kidney disease: Secondary | ICD-10-CM

## 2022-02-05 DIAGNOSIS — Z992 Dependence on renal dialysis: Secondary | ICD-10-CM | POA: Diagnosis not present

## 2022-02-05 DIAGNOSIS — N2581 Secondary hyperparathyroidism of renal origin: Secondary | ICD-10-CM | POA: Diagnosis not present

## 2022-02-06 DIAGNOSIS — N186 End stage renal disease: Secondary | ICD-10-CM | POA: Diagnosis not present

## 2022-02-06 DIAGNOSIS — Z992 Dependence on renal dialysis: Secondary | ICD-10-CM | POA: Diagnosis not present

## 2022-02-06 DIAGNOSIS — E1122 Type 2 diabetes mellitus with diabetic chronic kidney disease: Secondary | ICD-10-CM | POA: Diagnosis not present

## 2022-02-07 DIAGNOSIS — Z992 Dependence on renal dialysis: Secondary | ICD-10-CM | POA: Diagnosis not present

## 2022-02-07 DIAGNOSIS — N2581 Secondary hyperparathyroidism of renal origin: Secondary | ICD-10-CM | POA: Diagnosis not present

## 2022-02-07 DIAGNOSIS — N186 End stage renal disease: Secondary | ICD-10-CM | POA: Diagnosis not present

## 2022-02-07 DIAGNOSIS — D631 Anemia in chronic kidney disease: Secondary | ICD-10-CM | POA: Diagnosis not present

## 2022-02-10 ENCOUNTER — Other Ambulatory Visit (HOSPITAL_COMMUNITY): Payer: Self-pay

## 2022-02-10 ENCOUNTER — Other Ambulatory Visit: Payer: Self-pay | Admitting: Internal Medicine

## 2022-02-10 DIAGNOSIS — D631 Anemia in chronic kidney disease: Secondary | ICD-10-CM | POA: Diagnosis not present

## 2022-02-10 DIAGNOSIS — N2581 Secondary hyperparathyroidism of renal origin: Secondary | ICD-10-CM | POA: Diagnosis not present

## 2022-02-10 DIAGNOSIS — N186 End stage renal disease: Secondary | ICD-10-CM | POA: Diagnosis not present

## 2022-02-10 DIAGNOSIS — Z992 Dependence on renal dialysis: Secondary | ICD-10-CM | POA: Diagnosis not present

## 2022-02-10 MED ORDER — ATORVASTATIN CALCIUM 20 MG PO TABS
ORAL_TABLET | Freq: Every day | ORAL | 2 refills | Status: AC
  Filled 2022-02-10: qty 90, 90d supply, fill #0
  Filled 2022-03-02 – 2022-05-01 (×2): qty 90, 90d supply, fill #1

## 2022-02-11 ENCOUNTER — Other Ambulatory Visit: Payer: Self-pay | Admitting: Nurse Practitioner

## 2022-02-12 ENCOUNTER — Other Ambulatory Visit (HOSPITAL_COMMUNITY): Payer: Self-pay

## 2022-02-12 DIAGNOSIS — N2581 Secondary hyperparathyroidism of renal origin: Secondary | ICD-10-CM | POA: Diagnosis not present

## 2022-02-12 DIAGNOSIS — N186 End stage renal disease: Secondary | ICD-10-CM | POA: Diagnosis not present

## 2022-02-12 DIAGNOSIS — D631 Anemia in chronic kidney disease: Secondary | ICD-10-CM | POA: Diagnosis not present

## 2022-02-12 DIAGNOSIS — Z992 Dependence on renal dialysis: Secondary | ICD-10-CM | POA: Diagnosis not present

## 2022-02-12 MED ORDER — AURYXIA 1 GM 210 MG(FE) PO TABS
210.0000 mg | ORAL_TABLET | Freq: Three times a day (TID) | ORAL | 3 refills | Status: AC
  Filled 2022-02-12 – 2022-03-30 (×2): qty 270, 90d supply, fill #0

## 2022-02-12 MED ORDER — CALCIUM ACETATE (PHOS BINDER) 667 MG PO CAPS
ORAL_CAPSULE | ORAL | 3 refills | Status: DC
  Filled 2022-02-12: qty 390, 30d supply, fill #0

## 2022-02-13 DIAGNOSIS — E1065 Type 1 diabetes mellitus with hyperglycemia: Secondary | ICD-10-CM | POA: Diagnosis not present

## 2022-02-14 DIAGNOSIS — Z992 Dependence on renal dialysis: Secondary | ICD-10-CM | POA: Diagnosis not present

## 2022-02-14 DIAGNOSIS — D631 Anemia in chronic kidney disease: Secondary | ICD-10-CM | POA: Diagnosis not present

## 2022-02-14 DIAGNOSIS — N186 End stage renal disease: Secondary | ICD-10-CM | POA: Diagnosis not present

## 2022-02-14 DIAGNOSIS — N2581 Secondary hyperparathyroidism of renal origin: Secondary | ICD-10-CM | POA: Diagnosis not present

## 2022-02-17 ENCOUNTER — Other Ambulatory Visit (HOSPITAL_COMMUNITY): Payer: Self-pay

## 2022-02-17 DIAGNOSIS — D631 Anemia in chronic kidney disease: Secondary | ICD-10-CM | POA: Diagnosis not present

## 2022-02-17 DIAGNOSIS — N186 End stage renal disease: Secondary | ICD-10-CM | POA: Diagnosis not present

## 2022-02-17 DIAGNOSIS — N2581 Secondary hyperparathyroidism of renal origin: Secondary | ICD-10-CM | POA: Diagnosis not present

## 2022-02-17 DIAGNOSIS — Z992 Dependence on renal dialysis: Secondary | ICD-10-CM | POA: Diagnosis not present

## 2022-02-19 DIAGNOSIS — N2581 Secondary hyperparathyroidism of renal origin: Secondary | ICD-10-CM | POA: Diagnosis not present

## 2022-02-19 DIAGNOSIS — D631 Anemia in chronic kidney disease: Secondary | ICD-10-CM | POA: Diagnosis not present

## 2022-02-19 DIAGNOSIS — N186 End stage renal disease: Secondary | ICD-10-CM | POA: Diagnosis not present

## 2022-02-19 DIAGNOSIS — Z992 Dependence on renal dialysis: Secondary | ICD-10-CM | POA: Diagnosis not present

## 2022-02-20 ENCOUNTER — Other Ambulatory Visit (HOSPITAL_COMMUNITY): Payer: Self-pay

## 2022-02-21 ENCOUNTER — Other Ambulatory Visit (HOSPITAL_COMMUNITY): Payer: Self-pay

## 2022-02-21 DIAGNOSIS — N186 End stage renal disease: Secondary | ICD-10-CM | POA: Diagnosis not present

## 2022-02-21 DIAGNOSIS — N2581 Secondary hyperparathyroidism of renal origin: Secondary | ICD-10-CM | POA: Diagnosis not present

## 2022-02-21 DIAGNOSIS — Z992 Dependence on renal dialysis: Secondary | ICD-10-CM | POA: Diagnosis not present

## 2022-02-21 DIAGNOSIS — D631 Anemia in chronic kidney disease: Secondary | ICD-10-CM | POA: Diagnosis not present

## 2022-02-24 ENCOUNTER — Other Ambulatory Visit (HOSPITAL_COMMUNITY): Payer: Self-pay

## 2022-02-24 DIAGNOSIS — N186 End stage renal disease: Secondary | ICD-10-CM | POA: Diagnosis not present

## 2022-02-24 DIAGNOSIS — D631 Anemia in chronic kidney disease: Secondary | ICD-10-CM | POA: Diagnosis not present

## 2022-02-24 DIAGNOSIS — N2581 Secondary hyperparathyroidism of renal origin: Secondary | ICD-10-CM | POA: Diagnosis not present

## 2022-02-24 DIAGNOSIS — Z992 Dependence on renal dialysis: Secondary | ICD-10-CM | POA: Diagnosis not present

## 2022-02-26 DIAGNOSIS — D631 Anemia in chronic kidney disease: Secondary | ICD-10-CM | POA: Diagnosis not present

## 2022-02-26 DIAGNOSIS — Z992 Dependence on renal dialysis: Secondary | ICD-10-CM | POA: Diagnosis not present

## 2022-02-26 DIAGNOSIS — N186 End stage renal disease: Secondary | ICD-10-CM | POA: Diagnosis not present

## 2022-02-26 DIAGNOSIS — N2581 Secondary hyperparathyroidism of renal origin: Secondary | ICD-10-CM | POA: Diagnosis not present

## 2022-02-27 DIAGNOSIS — C9000 Multiple myeloma not having achieved remission: Secondary | ICD-10-CM | POA: Diagnosis not present

## 2022-02-27 DIAGNOSIS — I10 Essential (primary) hypertension: Secondary | ICD-10-CM | POA: Diagnosis not present

## 2022-02-27 DIAGNOSIS — E109 Type 1 diabetes mellitus without complications: Secondary | ICD-10-CM | POA: Diagnosis not present

## 2022-02-27 DIAGNOSIS — D649 Anemia, unspecified: Secondary | ICD-10-CM | POA: Diagnosis not present

## 2022-02-27 DIAGNOSIS — N186 End stage renal disease: Secondary | ICD-10-CM | POA: Diagnosis not present

## 2022-02-28 DIAGNOSIS — D631 Anemia in chronic kidney disease: Secondary | ICD-10-CM | POA: Diagnosis not present

## 2022-02-28 DIAGNOSIS — N2581 Secondary hyperparathyroidism of renal origin: Secondary | ICD-10-CM | POA: Diagnosis not present

## 2022-02-28 DIAGNOSIS — Z992 Dependence on renal dialysis: Secondary | ICD-10-CM | POA: Diagnosis not present

## 2022-02-28 DIAGNOSIS — N186 End stage renal disease: Secondary | ICD-10-CM | POA: Diagnosis not present

## 2022-03-02 ENCOUNTER — Other Ambulatory Visit: Payer: Self-pay | Admitting: Internal Medicine

## 2022-03-03 ENCOUNTER — Other Ambulatory Visit (HOSPITAL_COMMUNITY): Payer: Self-pay

## 2022-03-03 DIAGNOSIS — N186 End stage renal disease: Secondary | ICD-10-CM | POA: Diagnosis not present

## 2022-03-03 DIAGNOSIS — D631 Anemia in chronic kidney disease: Secondary | ICD-10-CM | POA: Diagnosis not present

## 2022-03-03 DIAGNOSIS — N2581 Secondary hyperparathyroidism of renal origin: Secondary | ICD-10-CM | POA: Diagnosis not present

## 2022-03-03 DIAGNOSIS — Z992 Dependence on renal dialysis: Secondary | ICD-10-CM | POA: Diagnosis not present

## 2022-03-03 MED ORDER — PANTOPRAZOLE SODIUM 40 MG PO TBEC
DELAYED_RELEASE_TABLET | Freq: Every day | ORAL | 1 refills | Status: AC
  Filled 2022-03-03: qty 90, 90d supply, fill #0
  Filled 2022-05-01: qty 90, 90d supply, fill #1

## 2022-03-04 ENCOUNTER — Other Ambulatory Visit (HOSPITAL_COMMUNITY): Payer: Self-pay

## 2022-03-04 ENCOUNTER — Telehealth: Payer: Self-pay | Admitting: Oncology

## 2022-03-05 ENCOUNTER — Other Ambulatory Visit (HOSPITAL_COMMUNITY): Payer: Self-pay

## 2022-03-05 DIAGNOSIS — N186 End stage renal disease: Secondary | ICD-10-CM | POA: Diagnosis not present

## 2022-03-05 DIAGNOSIS — Z992 Dependence on renal dialysis: Secondary | ICD-10-CM | POA: Diagnosis not present

## 2022-03-05 DIAGNOSIS — N2581 Secondary hyperparathyroidism of renal origin: Secondary | ICD-10-CM | POA: Diagnosis not present

## 2022-03-05 DIAGNOSIS — D631 Anemia in chronic kidney disease: Secondary | ICD-10-CM | POA: Diagnosis not present

## 2022-03-07 DIAGNOSIS — Z992 Dependence on renal dialysis: Secondary | ICD-10-CM | POA: Diagnosis not present

## 2022-03-07 DIAGNOSIS — D631 Anemia in chronic kidney disease: Secondary | ICD-10-CM | POA: Diagnosis not present

## 2022-03-07 DIAGNOSIS — N2581 Secondary hyperparathyroidism of renal origin: Secondary | ICD-10-CM | POA: Diagnosis not present

## 2022-03-07 DIAGNOSIS — N186 End stage renal disease: Secondary | ICD-10-CM | POA: Diagnosis not present

## 2022-03-08 DIAGNOSIS — E1122 Type 2 diabetes mellitus with diabetic chronic kidney disease: Secondary | ICD-10-CM | POA: Diagnosis not present

## 2022-03-08 DIAGNOSIS — Z992 Dependence on renal dialysis: Secondary | ICD-10-CM | POA: Diagnosis not present

## 2022-03-08 DIAGNOSIS — N186 End stage renal disease: Secondary | ICD-10-CM | POA: Diagnosis not present

## 2022-03-10 DIAGNOSIS — D631 Anemia in chronic kidney disease: Secondary | ICD-10-CM | POA: Diagnosis not present

## 2022-03-10 DIAGNOSIS — Z992 Dependence on renal dialysis: Secondary | ICD-10-CM | POA: Diagnosis not present

## 2022-03-10 DIAGNOSIS — N186 End stage renal disease: Secondary | ICD-10-CM | POA: Diagnosis not present

## 2022-03-10 DIAGNOSIS — N2581 Secondary hyperparathyroidism of renal origin: Secondary | ICD-10-CM | POA: Diagnosis not present

## 2022-03-12 DIAGNOSIS — N186 End stage renal disease: Secondary | ICD-10-CM | POA: Diagnosis not present

## 2022-03-12 DIAGNOSIS — Z992 Dependence on renal dialysis: Secondary | ICD-10-CM | POA: Diagnosis not present

## 2022-03-12 DIAGNOSIS — D631 Anemia in chronic kidney disease: Secondary | ICD-10-CM | POA: Diagnosis not present

## 2022-03-12 DIAGNOSIS — N2581 Secondary hyperparathyroidism of renal origin: Secondary | ICD-10-CM | POA: Diagnosis not present

## 2022-03-14 DIAGNOSIS — N2581 Secondary hyperparathyroidism of renal origin: Secondary | ICD-10-CM | POA: Diagnosis not present

## 2022-03-14 DIAGNOSIS — E1122 Type 2 diabetes mellitus with diabetic chronic kidney disease: Secondary | ICD-10-CM | POA: Diagnosis not present

## 2022-03-14 DIAGNOSIS — Z992 Dependence on renal dialysis: Secondary | ICD-10-CM | POA: Diagnosis not present

## 2022-03-14 DIAGNOSIS — N186 End stage renal disease: Secondary | ICD-10-CM | POA: Diagnosis not present

## 2022-03-14 DIAGNOSIS — D631 Anemia in chronic kidney disease: Secondary | ICD-10-CM | POA: Diagnosis not present

## 2022-03-16 DIAGNOSIS — E1065 Type 1 diabetes mellitus with hyperglycemia: Secondary | ICD-10-CM | POA: Diagnosis not present

## 2022-03-17 DIAGNOSIS — N2581 Secondary hyperparathyroidism of renal origin: Secondary | ICD-10-CM | POA: Diagnosis not present

## 2022-03-17 DIAGNOSIS — Z992 Dependence on renal dialysis: Secondary | ICD-10-CM | POA: Diagnosis not present

## 2022-03-17 DIAGNOSIS — N186 End stage renal disease: Secondary | ICD-10-CM | POA: Diagnosis not present

## 2022-03-17 DIAGNOSIS — D631 Anemia in chronic kidney disease: Secondary | ICD-10-CM | POA: Diagnosis not present

## 2022-03-18 ENCOUNTER — Ambulatory Visit: Payer: Medicare Other | Attending: Ophthalmology | Admitting: Occupational Therapy

## 2022-03-18 DIAGNOSIS — R41842 Visuospatial deficit: Secondary | ICD-10-CM | POA: Diagnosis not present

## 2022-03-18 NOTE — Therapy (Signed)
OUTPATIENT OCCUPATIONAL THERAPY NEURO EVALUATION  Patient Name: Isaiah [name] [facility] MRN: [id number] DOB:[birth year], 56 y.o., male Today's Date: 03/18/2022  PCP: Dr. Baird Cancer REFERRING PROVIDER: Dr. Herbert Deaner   OT End of Session - 03/18/22 1646     Visit Number 1    Number of Visits 1    OT Start Time 1404    OT Stop Time 1444    OT Time Calculation (min) 40 min    Activity Tolerance Patient tolerated treatment well             Past Medical History:  Diagnosis Date   Abnormality of gait 86/38/1771   Acute metabolic encephalopathy 16/57/9038   Allergy 06/27/2019   AMS (altered mental status) 10/05/2019   Anaphylactic shock, unspecified 07/30/2021   Aphasia due to acute cerebrovascular accident 02/28/2016   Aspiration pneumonia 06/09/2019   Bacteremia    Bilateral kidney stones 05/31/2019   Chronic intracranial subdural hematoma 06/09/2019   COPD (chronic obstructive pulmonary disease) 11/11/2016   CVA (cerebral vascular accident)    2017 and 2021; R-MCA, L-MCA, PCA and bilateral cerebellar complicated by DVT/PE   Delusional thoughts, secondary to stroke 2021   Diabetic retinopathy    PDR OU   Dysphagia, oropharyngeal phase 07/05/2019   ESRD (end stage renal disease) on dialysis    Essential hypertension 06/09/2019   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   GERD (gastroesophageal reflux disease) 11/11/2016   GIB (gastrointestinal bleeding)    Recurrent episodes- 09/2014, 09/2015 and 05/2016   Gout    Hammer toe, acquired 03/03/2016   Headache 09/28/2019   Hemiparesis affecting right side as late effect of cerebrovascular accident 02/28/2016   History of DVT (deep vein thrombosis) 01/08/2016   History of echocardiogram 08/2021   mod septal LVH o/w mild conc LVH, EF 55-60, no RWMA, Gr 2 DD, normal RVSF, severe LAE, trivial MR   History of ischemic left MCA stroke 01/08/2016   History of recent blood transfusion 10/27/2014   2 Units PRBC's   Hyperkalemia 07/2011    Hyperparathyroidism, secondary renal 11/11/2016   Hypocalcemia 12/26/2015   Hypotension 11/11/2016   Hypothyroidism 07/15/2019   Iron deficiency anemia, unspecified 01/22/2017   Ischemic colitis    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   Labile blood glucose    Labile blood pressure    Leukocytosis 01/08/2016   Multiple myeloma 05/17/2018   Onychomycosis due to dermatophyte 03/03/2016   Other specified coagulation defects 11/02/2015   PAF (paroxysmal atrial fibrillation)    Partial idiopathic epilepsy with seizures of localized onset, intractable, without status epilepticus 11/02/2017   Patent foramen ovale 11/11/2016   Proteus infection 05/28/2019   Pruritus, unspecified 11/02/2015   Pulmonary hypertension 11/11/2016    By echo 09/2014  RHC on 04/21/19 for further investigation of the nature of his elevated RVSP estimates on non-invasive imaging. His RHC (performed on a Thursday following HD on a Wednesday) demonstrated:  RA 10, RV 67/9, mPA 38, PCWP 19, CO 9.6, CI 4.5, PVR 2.0.   These findings are consistent with mild elevation in intracardiac filling pressures in the setting of high cardiac output in the setti   Right sided weakness 01/08/2016   Sepsis    Shortness of breath 11/02/2015   Sickle cell-thalassemia disease    a. Sickle cell trait.   Spastic hemiparesis 07/12/2019   Type 2 diabetes mellitus with hyperglycemia, without long-term current use of insulin    Ulcerative colitis 03/15/2020   Vascular dementia with behavioral disturbance ([place]) 11/05/2021   VTE (  venous thromboembolism) 11/11/2016   DVT + PE   Past Surgical History:  Procedure Laterality Date   AV FISTULA PLACEMENT Right 12/05/2015   Procedure: INSERTION OF ARTERIOVENOUS (AV) GORE-TEX GRAFT ARM;  Surgeon: Elam Dutch, MD;  Location: [place];  Service: Vascular;  Laterality: Right;   BONE MARROW BIOPSY     CATARACT EXTRACTION Right 09/2019   Dr. Herbert Deaner   CATARACT EXTRACTION Left 10/2019   Dr.  Herbert Deaner   CHOLECYSTECTOMY     CHOLECYSTECTOMY  [phone]?   COLONOSCOPY WITH PROPOFOL N/A 01/22/2017   Procedure: COLONOSCOPY WITH PROPOFOL;  Surgeon: Wilford Corner, MD;  Location: [facility];  Service: Endoscopy;  Laterality: N/A;   EYE SURGERY Bilateral    Cat Sx - Dr. Herbert Deaner   INSERTION OF DIALYSIS CATHETER Right 10/16/2015   Procedure: INSERTION OF PALINDROME DIALYSIS CATHETER ;  Surgeon: Elam Dutch, MD;  Location: [place];  Service: Vascular;  Laterality: Right;   OTHER SURGICAL HISTORY     Retinal surgery   PARS PLANA VITRECTOMY  02/17/2012   Procedure: PARS PLANA VITRECTOMY WITH 25 GAUGE;  Surgeon: Hayden Pedro, MD;  Location: [place];  Service: Ophthalmology;  Laterality: Right;   PERIPHERAL VASCULAR CATHETERIZATION N/A 03/20/2016   Procedure: A/V Shuntogram/Fistulagram;  Surgeon: Conrad Manhattan, MD;  Location: [place] [facility];  Service: Cardiovascular;  Laterality: N/A;   TEE WITHOUT CARDIOVERSION N/A 12/02/2016   Procedure: TRANSESOPHAGEAL ECHOCARDIOGRAM (TEE);  Surgeon: Larey Dresser, MD;  Location: [place];  Service: Cardiovascular;  Laterality: N/A;   Patient Active Problem List   Diagnosis Date Noted   Diabetes mellitus with end-stage renal disease ([place]) 01/19/2022   Benign hypertensive kidney disease with chronic kidney disease 01/19/2022   Ischemic colitis    Diabetic retinopathy 11/05/2021   Sickle cell-thalassemia disease 11/05/2021   Vascular dementia with behavioral disturbance ([place]) 11/05/2021   Delusional thoughts, secondary to stroke    Anaphylactic shock, unspecified 07/30/2021   Ulcerative colitis 03/15/2020   AMS (altered mental status) 22/29/7989   Acute metabolic encephalopathy 21/19/4174   Headache 09/28/2019   Abnormality of gait 09/13/2019   Hypothyroidism 07/15/2019   Spastic hemiparesis 07/12/2019   Dysphagia, oropharyngeal phase 07/05/2019   CVA (cerebral vascular accident)    Allergy 06/27/2019   Paroxysmal atrial fibrillation ([place])     Bacteremia    Labile blood pressure    Labile blood glucose    Type 2 diabetes mellitus with hyperglycemia, without long-term current use of insulin    Chronic intracranial subdural hematoma 06/09/2019   Essential hypertension 06/09/2019   Aspiration pneumonia 06/09/2019   Proteus infection 05/28/2019   Multiple myeloma 05/17/2018   Partial idiopathic epilepsy with seizures of localized onset, intractable, without status epilepticus 11/02/2017   Iron deficiency anemia 01/22/2017   COPD (chronic obstructive pulmonary disease) 11/11/2016   Gastroesophageal reflux disease 11/11/2016   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   Obesity (BMI 30-39.9) 11/11/2016   Pulmonary hypertension 11/11/2016   Patent foramen ovale 11/11/2016   Hyperparathyroidism, secondary renal 11/11/2016   VTE (venous thromboembolism) 11/11/2016   Hypotension 11/11/2016   Onychomycosis due to dermatophyte 03/03/2016   Hammer toe, acquired 03/03/2016   Hemiparesis affecting right side as late effect of cerebrovascular accident 02/28/2016   Aphasia due to acute cerebrovascular accident 02/28/2016   History of ischemic left MCA stroke 01/08/2016   Right sided weakness 01/08/2016   Leukocytosis 01/08/2016   History of DVT (deep vein thrombosis) 01/08/2016   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  Hypocalcemia 12/26/2015   Long term (current) use of anticoagulants 11/07/2015   Other specified coagulation defects 11/02/2015   Pruritus, unspecified 11/02/2015   Shortness of breath 11/02/2015   ESRD (end stage renal disease) on dialysis    Hyperkalemia 07/2011    ONSET DATE: 01/29/22  REFERRING DIAG: Diabetic retinopathy  THERAPY DIAG:  Visuospatial deficit  Rationale for Evaluation and Treatment Rehabilitation  SUBJECTIVE:   SUBJECTIVE STATEMENT: Pt reports he can't see well Pt accompanied by: significant other  PERTINENT HISTORY: CVA, DMII, glaucoma, sickle cell disease, diabetic retinopathy,  HTN, prostate CA, pulmonary embolism, seizures, dialysis  PRECAUTIONS: Fall and Other: seizures    PAIN:  Are you having pain? No  FALLS: Has patient fallen in last 6 months? Yes. Number of falls 3  LIVING ENVIRONMENT: Lives with: lives with their spouse Lives in: House/apartment Stairs:  has stair lift Has following equipment at home: Environmental consultant - 2 wheeled  PLOF: Needs assistance with ADLs and Needs assistance with gait  PATIENT GOALS read / see easier  OBJECTIVE:   HAND DOMINANCE: Right however R hand was affected by the CVA  ADLs: Overall ADLs: Pt requires assistance with all ADLS, mod A Transfers/ambulation related to ADLs:   I MOBILITY STATUS: Needs Assist: uses walker at home   COGNITION: Overall cognitive status: Impaired 16/30 for mini mental state exam,moderate cognitive impairment,  pt diagnosed with vascular dementia, pt also has aphasia which impedes ability to read.  VISION: Subjective report: Pt reports blurry vision and difficulty seeing out of right eye Baseline vision: Bifocals Visual history: retinopathy  VISION ASSESSMENT: Reading acuity: 20/160(with difficulty due to aphasia Visual acuity per MD report OD CF 1 ft, OS: 20/100 Pt reports severe blurry vision in R eye, pt reports vision is not impacting dressing. Patient has difficulty with following activities due to following visual impairments: reading   OBSERVATIONS: Pt demonstrates cognitive deficits and aphasia which impedes his ability to read.   TODAY'S TREATMENT:  Pt/ wife were shown 3x stand and handheld magnifiers, pt demonstrates ability to spot read a few words using magnification, however he was severely limited by aphasia and cognition. Therapist recommends to pt and wife that they enlarge print on I-pad or computer for reading rather than invest in magnification due to the severity of pt's communication and cognitive deficits.   PATIENT EDUCATION: Education details:   recommendation that patient's wife enlarges letter size and font on I pad and computer rather than purchasing magnifiers. Therapist also recommends that pt uses head turns to compensate for severe visual deficits in right eye. Person educated: Patient and Spouse Education method: Explanation Education comprehension: verbalized understanding       GOALS:  LONG TERM GOALS: no goals set, 1x visit  ASSESSMENT:  CLINICAL IMPRESSION: Patient is a 56 y.o. male who was seen today for occupational therapy evaluation for  Diabetic retinopathy.  Pt presents with visual deficits however aphasia and cognitive deficits are factors which impedes pt's ability to read and pt's ability to use magnification and adaptive equipment for reading.  PERFORMANCE DEFICITS in functional skills including ADLs, IADLs, coordination, dexterity, FMC, GMC, mobility, balance, decreased knowledge of precautions, decreased knowledge of use of DME, and vision, cognitive skills including attention, learn, memory, problem solving, safety awareness, sequencing, temperament/personality, thought, and understand, and psychosocial skills including coping strategies and environmental adaptation.   IMPAIRMENTS are limiting patient from ADLs and IADLs.   COMORBIDITIES may have co-morbidities  that affects occupational performance. Patient will benefit from skilled OT  to address above impairments and improve overall function.  MODIFICATION OR ASSISTANCE TO COMPLETE EVALUATION: Min-Moderate modification of tasks or assist with assess necessary to complete an evaluation.  OT OCCUPATIONAL PROFILE AND HISTORY: Problem focused assessment: Including review of records relating to presenting problem.  CLINICAL DECISION MAKING: LOW - limited treatment options, no task modification necessary  REHAB POTENTIAL: Poor aphasia, cognitive deficits  EVALUATION COMPLEXITY: Low    PLAN: OT FREQUENCY: one time visit  OT DURATION: 4  weeks  PLANNED INTERVENTIONS: self care/ADL training, patient/family education, cognitive remediation/compensation, and visual/perceptual remediation/compensation  RECOMMENDED OTHER SERVICES: n/a  CONSULTED AND AGREED WITH PLAN OF CARE: Patient and family member/caregiver  PLAN FOR NEXT SESSION: 1x visit, no additional low vision treatment recommended as pt is not able to effectively use adaptive equipment due to cognitive deficits and aphasia.   Isaiah [name], OT 03/18/2022, 4:48 PM

## 2022-03-19 DIAGNOSIS — Z992 Dependence on renal dialysis: Secondary | ICD-10-CM | POA: Diagnosis not present

## 2022-03-19 DIAGNOSIS — N2581 Secondary hyperparathyroidism of renal origin: Secondary | ICD-10-CM | POA: Diagnosis not present

## 2022-03-19 DIAGNOSIS — N186 End stage renal disease: Secondary | ICD-10-CM | POA: Diagnosis not present

## 2022-03-19 DIAGNOSIS — D631 Anemia in chronic kidney disease: Secondary | ICD-10-CM | POA: Diagnosis not present

## 2022-03-21 ENCOUNTER — Encounter: Payer: Self-pay | Admitting: Internal Medicine

## 2022-03-21 DIAGNOSIS — D631 Anemia in chronic kidney disease: Secondary | ICD-10-CM | POA: Diagnosis not present

## 2022-03-21 DIAGNOSIS — N186 End stage renal disease: Secondary | ICD-10-CM | POA: Diagnosis not present

## 2022-03-21 DIAGNOSIS — Z992 Dependence on renal dialysis: Secondary | ICD-10-CM | POA: Diagnosis not present

## 2022-03-21 DIAGNOSIS — N2581 Secondary hyperparathyroidism of renal origin: Secondary | ICD-10-CM | POA: Diagnosis not present

## 2022-03-24 DIAGNOSIS — Z992 Dependence on renal dialysis: Secondary | ICD-10-CM | POA: Diagnosis not present

## 2022-03-24 DIAGNOSIS — N2581 Secondary hyperparathyroidism of renal origin: Secondary | ICD-10-CM | POA: Diagnosis not present

## 2022-03-24 DIAGNOSIS — D631 Anemia in chronic kidney disease: Secondary | ICD-10-CM | POA: Diagnosis not present

## 2022-03-24 DIAGNOSIS — N186 End stage renal disease: Secondary | ICD-10-CM | POA: Diagnosis not present

## 2022-03-26 DIAGNOSIS — Z992 Dependence on renal dialysis: Secondary | ICD-10-CM | POA: Diagnosis not present

## 2022-03-26 DIAGNOSIS — N2581 Secondary hyperparathyroidism of renal origin: Secondary | ICD-10-CM | POA: Diagnosis not present

## 2022-03-26 DIAGNOSIS — N186 End stage renal disease: Secondary | ICD-10-CM | POA: Diagnosis not present

## 2022-03-26 DIAGNOSIS — D631 Anemia in chronic kidney disease: Secondary | ICD-10-CM | POA: Diagnosis not present

## 2022-03-28 DIAGNOSIS — Z992 Dependence on renal dialysis: Secondary | ICD-10-CM | POA: Diagnosis not present

## 2022-03-28 DIAGNOSIS — N186 End stage renal disease: Secondary | ICD-10-CM | POA: Diagnosis not present

## 2022-03-28 DIAGNOSIS — D631 Anemia in chronic kidney disease: Secondary | ICD-10-CM | POA: Diagnosis not present

## 2022-03-28 DIAGNOSIS — N2581 Secondary hyperparathyroidism of renal origin: Secondary | ICD-10-CM | POA: Diagnosis not present

## 2022-03-30 ENCOUNTER — Other Ambulatory Visit: Payer: Self-pay | Admitting: Internal Medicine

## 2022-03-31 ENCOUNTER — Other Ambulatory Visit (HOSPITAL_COMMUNITY): Payer: Self-pay

## 2022-03-31 DIAGNOSIS — Z992 Dependence on renal dialysis: Secondary | ICD-10-CM | POA: Diagnosis not present

## 2022-03-31 DIAGNOSIS — N2581 Secondary hyperparathyroidism of renal origin: Secondary | ICD-10-CM | POA: Diagnosis not present

## 2022-03-31 DIAGNOSIS — N186 End stage renal disease: Secondary | ICD-10-CM | POA: Diagnosis not present

## 2022-03-31 DIAGNOSIS — D631 Anemia in chronic kidney disease: Secondary | ICD-10-CM | POA: Diagnosis not present

## 2022-03-31 MED ORDER — FLUTICASONE FUROATE-VILANTEROL 200-25 MCG/ACT IN AEPB
INHALATION_SPRAY | RESPIRATORY_TRACT | 1 refills | Status: AC
  Filled 2022-03-31: qty 60, 30d supply, fill #0
  Filled 2022-05-01: qty 60, 30d supply, fill #1

## 2022-04-01 ENCOUNTER — Other Ambulatory Visit (HOSPITAL_COMMUNITY): Payer: Self-pay

## 2022-04-02 ENCOUNTER — Other Ambulatory Visit (HOSPITAL_COMMUNITY): Payer: Self-pay

## 2022-04-02 DIAGNOSIS — D631 Anemia in chronic kidney disease: Secondary | ICD-10-CM | POA: Diagnosis not present

## 2022-04-02 DIAGNOSIS — N186 End stage renal disease: Secondary | ICD-10-CM | POA: Diagnosis not present

## 2022-04-02 DIAGNOSIS — N2581 Secondary hyperparathyroidism of renal origin: Secondary | ICD-10-CM | POA: Diagnosis not present

## 2022-04-02 DIAGNOSIS — Z992 Dependence on renal dialysis: Secondary | ICD-10-CM | POA: Diagnosis not present

## 2022-04-04 DIAGNOSIS — D631 Anemia in chronic kidney disease: Secondary | ICD-10-CM | POA: Diagnosis not present

## 2022-04-04 DIAGNOSIS — N2581 Secondary hyperparathyroidism of renal origin: Secondary | ICD-10-CM | POA: Diagnosis not present

## 2022-04-04 DIAGNOSIS — N186 End stage renal disease: Secondary | ICD-10-CM | POA: Diagnosis not present

## 2022-04-04 DIAGNOSIS — Z992 Dependence on renal dialysis: Secondary | ICD-10-CM | POA: Diagnosis not present

## 2022-04-07 DIAGNOSIS — N2581 Secondary hyperparathyroidism of renal origin: Secondary | ICD-10-CM | POA: Diagnosis not present

## 2022-04-07 DIAGNOSIS — Z992 Dependence on renal dialysis: Secondary | ICD-10-CM | POA: Diagnosis not present

## 2022-04-07 DIAGNOSIS — D631 Anemia in chronic kidney disease: Secondary | ICD-10-CM | POA: Diagnosis not present

## 2022-04-07 DIAGNOSIS — N186 End stage renal disease: Secondary | ICD-10-CM | POA: Diagnosis not present

## 2022-04-08 DIAGNOSIS — Z992 Dependence on renal dialysis: Secondary | ICD-10-CM | POA: Diagnosis not present

## 2022-04-08 DIAGNOSIS — E1122 Type 2 diabetes mellitus with diabetic chronic kidney disease: Secondary | ICD-10-CM | POA: Diagnosis not present

## 2022-04-08 DIAGNOSIS — N186 End stage renal disease: Secondary | ICD-10-CM | POA: Diagnosis not present

## 2022-04-09 DIAGNOSIS — Z992 Dependence on renal dialysis: Secondary | ICD-10-CM | POA: Diagnosis not present

## 2022-04-09 DIAGNOSIS — D631 Anemia in chronic kidney disease: Secondary | ICD-10-CM | POA: Diagnosis not present

## 2022-04-09 DIAGNOSIS — N2581 Secondary hyperparathyroidism of renal origin: Secondary | ICD-10-CM | POA: Diagnosis not present

## 2022-04-09 DIAGNOSIS — N186 End stage renal disease: Secondary | ICD-10-CM | POA: Diagnosis not present

## 2022-04-10 ENCOUNTER — Other Ambulatory Visit: Payer: Self-pay

## 2022-04-10 ENCOUNTER — Inpatient Hospital Stay: Payer: Medicare Other | Attending: Oncology

## 2022-04-10 DIAGNOSIS — Z992 Dependence on renal dialysis: Secondary | ICD-10-CM | POA: Insufficient documentation

## 2022-04-10 DIAGNOSIS — N189 Chronic kidney disease, unspecified: Secondary | ICD-10-CM | POA: Insufficient documentation

## 2022-04-10 DIAGNOSIS — D631 Anemia in chronic kidney disease: Secondary | ICD-10-CM | POA: Insufficient documentation

## 2022-04-10 DIAGNOSIS — C9000 Multiple myeloma not having achieved remission: Secondary | ICD-10-CM | POA: Insufficient documentation

## 2022-04-10 LAB — CBC WITH DIFFERENTIAL (CANCER CENTER ONLY)
Abs Immature Granulocytes: 0.03 10*3/uL (ref 0.00–0.07)
Basophils Absolute: 0.1 10*3/uL (ref 0.0–0.1)
Basophils Relative: 1 %
Eosinophils Absolute: 0.6 10*3/uL — ABNORMAL HIGH (ref 0.0–0.5)
Eosinophils Relative: 6 %
Hemoglobin: 11.1 g/dL — ABNORMAL LOW (ref 13.0–17.0)
Immature Granulocytes: 0 %
Lymphocytes Relative: 15 %
Lymphs Abs: 1.4 10*3/uL (ref 0.7–4.0)
Monocytes Absolute: 1.2 10*3/uL — ABNORMAL HIGH (ref 0.1–1.0)
Monocytes Relative: 13 %
Neutro Abs: 6.3 10*3/uL (ref 1.7–7.7)
Neutrophils Relative %: 65 %
Platelet Count: 248 10*3/uL (ref 150–400)
WBC Count: 9.7 10*3/uL (ref 4.0–10.5)

## 2022-04-10 LAB — CMP (CANCER CENTER ONLY)
ALT: 13 U/L (ref 0–44)
AST: 16 U/L (ref 15–41)
Albumin: 4.6 g/dL (ref 3.5–5.0)
Alkaline Phosphatase: 144 U/L — ABNORMAL HIGH (ref 38–126)
Anion gap: 12 (ref 5–15)
BUN: 32 mg/dL — ABNORMAL HIGH (ref 6–20)
CO2: 32 mmol/L (ref 22–32)
Calcium: 8.7 mg/dL — ABNORMAL LOW (ref 8.9–10.3)
Chloride: 96 mmol/L — ABNORMAL LOW (ref 98–111)
Creatinine: 6.58 mg/dL (ref 0.61–1.24)
GFR, Estimated: 9 mL/min — ABNORMAL LOW (ref >60)
Glucose, Bld: 122 mg/dL — ABNORMAL HIGH (ref 70–99)
Potassium: 3.9 mmol/L (ref 3.5–5.1)
Sodium: 140 mmol/L (ref 135–145)
Total Bilirubin: 0.9 mg/dL (ref 0.3–1.2)
Total Protein: 7.7 g/dL (ref 6.5–8.1)

## 2022-04-11 DIAGNOSIS — Z992 Dependence on renal dialysis: Secondary | ICD-10-CM | POA: Diagnosis not present

## 2022-04-11 DIAGNOSIS — D631 Anemia in chronic kidney disease: Secondary | ICD-10-CM | POA: Diagnosis not present

## 2022-04-11 DIAGNOSIS — N186 End stage renal disease: Secondary | ICD-10-CM | POA: Diagnosis not present

## 2022-04-11 DIAGNOSIS — N2581 Secondary hyperparathyroidism of renal origin: Secondary | ICD-10-CM | POA: Diagnosis not present

## 2022-04-11 LAB — KAPPA/LAMBDA LIGHT CHAINS
Kappa free light chain: 221 mg/L — ABNORMAL HIGH (ref 3.3–19.4)
Kappa, lambda light chain ratio: 2.21 — ABNORMAL HIGH (ref 0.26–1.65)
Lambda free light chains: 100.2 mg/L — ABNORMAL HIGH (ref 5.7–26.3)

## 2022-04-14 DIAGNOSIS — Z992 Dependence on renal dialysis: Secondary | ICD-10-CM | POA: Diagnosis not present

## 2022-04-14 DIAGNOSIS — N2581 Secondary hyperparathyroidism of renal origin: Secondary | ICD-10-CM | POA: Diagnosis not present

## 2022-04-14 DIAGNOSIS — N186 End stage renal disease: Secondary | ICD-10-CM | POA: Diagnosis not present

## 2022-04-14 DIAGNOSIS — D631 Anemia in chronic kidney disease: Secondary | ICD-10-CM | POA: Diagnosis not present

## 2022-04-14 LAB — MULTIPLE MYELOMA PANEL, SERUM
Albumin SerPl Elph-Mcnc: 4.1 g/dL (ref 2.9–4.4)
Albumin/Glob SerPl: 1.5 (ref 0.7–1.7)
Alpha 1: 0.3 g/dL (ref 0.0–0.4)
Alpha2 Glob SerPl Elph-Mcnc: 0.7 g/dL (ref 0.4–1.0)
B-Globulin SerPl Elph-Mcnc: 0.7 g/dL (ref 0.7–1.3)
Gamma Glob SerPl Elph-Mcnc: 1 g/dL (ref 0.4–1.8)
Globulin, Total: 2.8 g/dL (ref 2.2–3.9)
IgA: 102 mg/dL (ref 90–386)
IgG (Immunoglobin G), Serum: 971 mg/dL (ref 603–1613)
IgM (Immunoglobulin M), Srm: 36 mg/dL (ref 20–172)
Total Protein ELP: 6.9 g/dL (ref 6.0–8.5)

## 2022-04-16 DIAGNOSIS — Z992 Dependence on renal dialysis: Secondary | ICD-10-CM | POA: Diagnosis not present

## 2022-04-16 DIAGNOSIS — N186 End stage renal disease: Secondary | ICD-10-CM | POA: Diagnosis not present

## 2022-04-16 DIAGNOSIS — N2581 Secondary hyperparathyroidism of renal origin: Secondary | ICD-10-CM | POA: Diagnosis not present

## 2022-04-16 DIAGNOSIS — D631 Anemia in chronic kidney disease: Secondary | ICD-10-CM | POA: Diagnosis not present

## 2022-04-16 DIAGNOSIS — E1065 Type 1 diabetes mellitus with hyperglycemia: Secondary | ICD-10-CM | POA: Diagnosis not present

## 2022-04-17 ENCOUNTER — Inpatient Hospital Stay (HOSPITAL_BASED_OUTPATIENT_CLINIC_OR_DEPARTMENT_OTHER): Payer: Medicare Other | Admitting: Oncology

## 2022-04-17 ENCOUNTER — Other Ambulatory Visit: Payer: Self-pay

## 2022-04-17 VITALS — BP 139/46 | HR 92 | Temp 97.0°F | Resp 18 | Wt 197.0 lb

## 2022-04-17 DIAGNOSIS — C9000 Multiple myeloma not having achieved remission: Secondary | ICD-10-CM

## 2022-04-17 DIAGNOSIS — Z992 Dependence on renal dialysis: Secondary | ICD-10-CM | POA: Diagnosis not present

## 2022-04-17 DIAGNOSIS — D631 Anemia in chronic kidney disease: Secondary | ICD-10-CM | POA: Diagnosis not present

## 2022-04-17 DIAGNOSIS — N189 Chronic kidney disease, unspecified: Secondary | ICD-10-CM | POA: Diagnosis not present

## 2022-04-17 NOTE — Progress Notes (Signed)
Hematology and Oncology Follow Up Visit  [place] [id number] Feb 29, 1966 56 y.o. 04/17/2022 3:14 PM    Principle Diagnosis: 56 year old man with multiple myeloma diagnosed in 2019.  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   Past therapy: Velcade dexamethasone and cyclophosphamide weekly started on 05/25/2018.  Velcade is given at 1.5 mg/m weekly, dexamethasone is 20 mg weekly and and Cytoxan is given at 650 mg total dose.  Last treatment was given in August 2020.    Therapy was held today in anticipation of high-dose chemotherapy and autologous stem cell transplant.  He did not receive stem cell transplant due to subdural hematoma and sepsis in September 2020.  Velcade at 1.5 mg per metered squared subcutaneously weekly, dexamethasone 20 mg weekly, daratumumab 1800 mg subcutaneous injection.   Cycle 1 started on 08/30/2019.  Therapy concluded in June 2021.   Daratumumab monthly maintenance started in June 2021.  Therapy discontinued in December 2021.  Current therapy: Active surveillance.  Interim History:  Mr. Gover returns today for a follow-up visit.  Since the last visit, he reports no major changes in his health.  He continues to be dialysis dependent without any issues at this time.  He denies any hospitalizations or illnesses.  He denies any bone pain or pathological fractures.  He remains unsteady and limited in his mobility and requires assistance at times.  He has fallen few times.  He does use a walker at home.                   Medications: Reviewed without changes Current Outpatient Medications:    acetaminophen (TYLENOL) 325 MG tablet, Take 1-2 tablets (325-650 mg total) by mouth every 4 (four) hours as needed for mild pain., Disp:  , Rfl:    albuterol (PROAIR HFA) 108 (90 Base) MCG/ACT inhaler, Inhale two puffs every 4-6 hours if needed for cough or wheeze, Disp: 6.7 g, Rfl: 2   Alcohol Swabs (SM ALCOHOL PREP) 70 % PADS, , Disp: , Rfl:     atorvastatin (LIPITOR) 20 MG tablet, TAKE 1 TABLET BY MOUTH ONCE A DAY, Disp: 90 tablet, Rfl: 2   AURYXIA 1 GM 210 MG(Fe) tablet, Take 210 mg by mouth every evening., Disp: , Rfl:    calcitRIOL (ROCALTROL) 0.25 MCG capsule, Take 0.25 mcg by mouth daily., Disp: , Rfl:    Cetirizine HCl 10 MG CAPS, Take 1 capsule by mouth daily. , Disp: , Rfl:    Cholecalciferol (D3-1000 PO), Take 1 tablet by mouth See admin instructions. Take one tablet by mouth on Monday,Wednesday and Fridays, Disp: , Rfl:    clonazePAM (KLONOPIN) 0.5 MG tablet, Take 2 tablets in morning on Monday, Wednesday, Friday before dialysis, Disp: 60 tablet, Rfl: 5   donepezil (ARICEPT) 5 MG tablet, Take 1/2 tablet daily for 2 weeks, then increase to 1 tablet daily and continue, Disp: 30 tablet, Rfl: 6   escitalopram (LEXAPRO) 10 MG tablet, Take 1 tablet (10 mg total) by mouth daily., Disp: 90 tablet, Rfl: 3   ferric citrate (AURYXIA) 1 GM 210 MG(Fe) tablet, Take 1 tablet by mouth 3 times a day with meals as directed, Disp: 270 tablet, Rfl: 3   ferric citrate (AURYXIA) 1 GM 210 MG(Fe) tablet, Take 1 tablet (210 mg total) by mouth 3 (three) times daily with meals as directed, Disp: 270 tablet, Rfl: 3   fluticasone (FLONASE) 50 MCG/ACT nasal spray, Place 1 spray into both nostrils as needed for allergies., Disp: ,  Rfl:    fluticasone furoate-vilanterol (BREO ELLIPTA) 200-25 MCG/ACT AEPB, Inhale 1 puff by mouth into the lungs at the same time everyday, Disp: 60 each, Rfl: 1   FREESTYLE LITE test strip, , Disp: , Rfl:    Glucagon, rDNA, (GLUCAGON EMERGENCY) 1 MG KIT, See admin instructions., Disp: , Rfl:    glucose blood (FREESTYLE LITE) test strip, Use as directed to test blood glucose levels 3 times daily., Disp: 300 each, Rfl: 3   hydrOXYzine (ATARAX) 25 MG tablet, Take 1 tablet (25 mg total) by mouth as needed., Disp: 30 tablet, Rfl: 1   insulin glargine-yfgn (SEMGLEE, YFGN,) 100 UNIT/ML Pen, Inject 6 units under the skin once daily (discard  pen after 28 days from first use), Disp: 3 mL, Rfl: 11   insulin glargine-yfgn (SEMGLEE, YFGN,) 100 UNIT/ML Pen, Inject 6 units under the skin once daily (discard pen after 28 days from first use), Disp: 3 mL, Rfl: 12   Insulin Glargine-yfgn (SEMGLEE, YFGN,) 100 UNIT/ML SOPN, Inject 6 units subcutaneously once a day, Disp: 9 mL, Rfl: 4   insulin lispro (HUMALOG KWIKPEN) 100 UNIT/ML KwikPen, Inject 3 to 4 units under the skin three times daily as needed, Disp: 9 mL, Rfl: 6   Insulin Pen Needle 32G X 4 MM MISC, USE AS DIRECTED 4 TO 5 TIMES DAILY TO INJECT INSULIN, Disp: 400 each, Rfl: 6   lacosamide (VIMPAT) 200 MG TABS tablet, TAKE 1 TABLET TWICE A DAY EXCEPT ON DIALYSIS DAYS M-W-F GIVE 1 TAB IN AM, 1 TAB AFTER DIALYSIS, 1 TAB IN PM., Disp: 206 tablet, Rfl: 1   lamoTRIgine (LAMICTAL) 200 MG tablet, TAKE 1 TABLET BY MOUTH 2 TIMES DAILY, Disp: 180 tablet, Rfl: 3   Lancets (FREESTYLE) lancets, , Disp: , Rfl:    Methoxy PEG-Epoetin Beta (MIRCERA IJ), , Disp: , Rfl:    metoprolol succinate (TOPROL-XL) 25 MG 24 hr tablet, Take 25 mg by mouth 2 (two) times daily., Disp: , Rfl:    midodrine (PROAMATINE) 10 MG tablet, Take 1 tablet by mouth 2 times daily., Disp: 180 tablet, Rfl: 1   multivitamin (RENA-VIT) TABS tablet, Take 1 tablet by mouth at bedtime., Disp: , Rfl:    pantoprazole (PROTONIX) 40 MG tablet, TAKE 1 TABLET BY MOUTH ONCE DAILY, Disp: 90 tablet, Rfl: 1   prochlorperazine (COMPAZINE) 10 MG tablet, Take 10 mg by mouth every 6 (six) hours as needed for nausea., Disp: , Rfl:    UNIFINE PENTIPS 31G X 8 MM MISC, USE AS DIRECTED DAILY AFTER SUPPER, Disp: 100 each, Rfl: 3  Allergies:  Allergies  Allergen Reactions   Codeine Swelling   Codeine Rash and Other (See Comments)    Unknown reaction (patient says it was more serious than just a rash, but he can't remember what happened)       Physical Exam:             Blood pressure (!) 139/46, pulse 92, temperature (!) 97 F (36.1 C),  resp. rate 18, weight 197 lb (89.4 kg), SpO2 97 %.       ECOG: 2   General appearance: Alert, awake without any distress. Head: Atraumatic without abnormalities Oropharynx: Without any thrush or ulcers. Eyes: No scleral icterus. Lymph nodes: No lymphadenopathy noted in the cervical, supraclavicular, or axillary nodes Heart:regular rate and rhythm, without any murmurs or gallops.   Lung: Clear to auscultation without any rhonchi, wheezes or dullness to percussion. Abdomin: Soft, nontender without any shifting dullness or ascites. Musculoskeletal: No  clubbing or cyanosis. Neurological: No motor or sensory deficits. Skin: No rashes or lesions.                       Lab Results: Lab Results  Component Value Date   WBC 9.7 04/10/2022   HGB 11.1 (L) 04/10/2022   HCT RESULTS UNAVAILABLE DUE TO INTERFERING SUBSTANCE 04/10/2022   MCV RESULTS UNAVAILABLE DUE TO INTERFERING SUBSTANCE 04/10/2022   PLT 248 04/10/2022     Chemistry      Component Value Date/Time   NA 140 04/10/2022 1120   NA 141 07/19/2020 0000   NA 138 07/03/2017 0813   K 3.9 04/10/2022 1120   K 4.2 07/03/2017 0813   CL 96 (L) 04/10/2022 1120   CO2 32 04/10/2022 1120   CO2 27 07/03/2017 0813   BUN 32 (H) 04/10/2022 1120   BUN 65 (A) 08/15/2020 0000   BUN 46.4 (H) 07/03/2017 0813   CREATININE 6.58 (HH) 04/10/2022 1120   CREATININE 10.9 (HH) 07/03/2017 0813   GLU 162 08/15/2020 0000      Component Value Date/Time   CALCIUM 8.7 (L) 04/10/2022 1120   CALCIUM 9.3 07/03/2017 0813   ALKPHOS 144 (H) 04/10/2022 1120   ALKPHOS 77 07/03/2017 0813   AST 16 04/10/2022 1120   AST 16 07/03/2017 0813   ALT 13 04/10/2022 1120   ALT 14 07/03/2017 0813   BILITOT 0.9 04/10/2022 1120   BILITOT 0.93 07/03/2017 0813       Latest Reference Range & Units 06/20/21 09:34 09/26/21 08:34 01/03/22 09:18 04/10/22 11:20  M Protein SerPl Elph-Mcnc Not Observed g/dL Not Observed (C) Not Observed (C) Not Observed  (C) Not Observed (C)  IFE 1  Comment (C) Comment (C) Comment (C) Comment (C)  Globulin, Total 2.2 - 3.9 g/dL 3.1 (C) 3.4 (C) 2.7 (C) 2.8 (C)  B-Globulin SerPl Elph-Mcnc 0.7 - 1.3 g/dL 0.8 (C) 0.9 (C) 0.8 (C) 0.7 (C)  IgG (Immunoglobin G), Serum 603 - 1,613 mg/dL 1,150 1,133 951 971  IgM (Immunoglobulin M), Srm 20 - 172 mg/dL 29 32 33 36  IgA 90 - 386 mg/dL 82 (L) 85 (L) 82 (L) 102     Latest Reference Range & Units 01/03/22 09:18 04/10/22 11:20  Kappa free light chain 3.3 - 19.4 mg/L 210.0 (H) 221.0 (H)  Lambda free light chains 5.7 - 26.3 mg/L 71.3 (H) 100.2 (H)  Kappa, lambda light chain ratio 0.26 - 1.65  2.95 (H) 2.21 (H)  (H): Data is abnormally high  Impression and Plan:  57 year old man with:  1.  Multiple myeloma diagnosed in 2019.  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  He had continues to be on active surveillance given his overall poor health currently improving.  He is protein studies obtained on April 10, 2022 were personally reviewed and continues to show no evidence of detectable M spike with normal quantitative immunoglobulins.  He has elevated kappa and lambda light chains with slight elevation in his ratio.  Salvage options including daratumumab based regimen sky progress were reiterated at that this time and recommended continued active surveillance.  He is agreeable to continue at this time.  2. Anemia: Related to plasma cell disorder and renal failure.  His hemoglobin remains adequate.   3.  Chronic kidney disease: He continues to be hemodialysis dependent.  4. Goals of care: Therapy remains palliative given his incurable malignancy but aggressive measures are still warranted given his reasonable performance status.  6. Follow-up: He will return in 3 months for a follow-up.   30 minutes were spent on this visit.  The time was dedicated to reviewing laboratory data, disease status update and outlining future plan of care discussion.    Zola Button, MD 8/10/20233:14 PM

## 2022-04-18 DIAGNOSIS — N186 End stage renal disease: Secondary | ICD-10-CM | POA: Diagnosis not present

## 2022-04-18 DIAGNOSIS — N2581 Secondary hyperparathyroidism of renal origin: Secondary | ICD-10-CM | POA: Diagnosis not present

## 2022-04-18 DIAGNOSIS — Z992 Dependence on renal dialysis: Secondary | ICD-10-CM | POA: Diagnosis not present

## 2022-04-18 DIAGNOSIS — D631 Anemia in chronic kidney disease: Secondary | ICD-10-CM | POA: Diagnosis not present

## 2022-04-21 ENCOUNTER — Other Ambulatory Visit (HOSPITAL_COMMUNITY): Payer: Self-pay

## 2022-04-21 DIAGNOSIS — N2581 Secondary hyperparathyroidism of renal origin: Secondary | ICD-10-CM | POA: Diagnosis not present

## 2022-04-21 DIAGNOSIS — N186 End stage renal disease: Secondary | ICD-10-CM | POA: Diagnosis not present

## 2022-04-21 DIAGNOSIS — D631 Anemia in chronic kidney disease: Secondary | ICD-10-CM | POA: Diagnosis not present

## 2022-04-21 DIAGNOSIS — Z992 Dependence on renal dialysis: Secondary | ICD-10-CM | POA: Diagnosis not present

## 2022-04-22 ENCOUNTER — Encounter: Payer: Medicare Other | Attending: Physical Medicine & Rehabilitation | Admitting: Physical Medicine & Rehabilitation

## 2022-04-22 ENCOUNTER — Encounter: Payer: Self-pay | Admitting: Physical Medicine & Rehabilitation

## 2022-04-22 VITALS — BP 173/93 | HR 65 | Ht 71.0 in

## 2022-04-22 DIAGNOSIS — G8110 Spastic hemiplegia affecting unspecified side: Secondary | ICD-10-CM | POA: Insufficient documentation

## 2022-04-22 DIAGNOSIS — R269 Unspecified abnormalities of gait and mobility: Secondary | ICD-10-CM | POA: Diagnosis not present

## 2022-04-22 DIAGNOSIS — I639 Cerebral infarction, unspecified: Secondary | ICD-10-CM | POA: Diagnosis not present

## 2022-04-22 NOTE — Progress Notes (Signed)
Subjective:    Patient ID: [place], male    DOB: 08-08-1966, 56 y.o.   MRN: [id number]  HPI 56 yo male with remote bilateral frontal hemorrhages > 31yrago who had Right SDH in 2020 who returns for PMR f/u . Has been diagnosed with vascular dementia and see Dr ADelice Leschfrom neurology. Needs help with dressing ~1 x per week, "bad day"  related to dementia perhaps more commonly after HD  Amb with walker to bathroom with supervision   Has foot pedals for LE  exercise   Seen by Dr SAlen Blewfor MM f/u   Pain Inventory Average Pain 0 Pain Right Now 0 My pain is  no pain  LOCATION OF PAIN  n/a  BOWEL Number of stools per week: 2    BLADDER Dialysis    Mobility walk with assistance use a walker ability to climb steps?  yes do you drive?  no use a wheelchair needs help with transfers Do you have any goals in this area?  yes  Function disabled: date disabled .  Neuro/Psych trouble walking  Prior Studies Any changes since last visit?  no  Physicians involved in your care Any changes since last visit?  no   Family History  Problem Relation Age of Onset   Hypertension Mother    Diabetes Mother    Hypertension Father    Stroke Father    Sickle cell anemia Brother    Sickle cell anemia Brother    Diabetes type I Brother    Kidney disease Brother    Multiple myeloma Maternal Aunt    Mental illness Maternal Aunt        visual hallucinations of unknown origin   Social History   Socioeconomic History   Marital status: Married    Spouse name: SSunday Spillers  Number of children: 1   Years of education: 18   Highest education level: Master's degree (e.g., MA, MS, MEng, MEd, MSW, MBA)  Occupational History   Occupation: Disability    Comment: prior AD;Cytogeneticistof compliance  Tobacco Use   Smoking status: Never   Smokeless tobacco: Never  Vaping Use   Vaping Use: Never used  Substance and Sexual Activity   Alcohol use: Not Currently   Drug use: Never    Sexual activity: Yes    Partners: Female  Other Topics Concern   Not on file  Social History Narrative   ** Merged History Encounter **       Pt lives in 2 story home with his wife and 1 son   Has masters degree in psychology   Currently disabled.   Right handed       Social Determinants of Health   Financial Resource Strain: Low Risk  (04/18/2021)   Overall Financial Resource Strain (CARDIA)    Difficulty of Paying Living Expenses: Not hard at all  Food Insecurity: No Food Insecurity (04/18/2021)   Hunger Vital Sign    Worried About Running Out of Food in the Last Year: Never true    Ran Out of Food in the Last Year: Never true  Transportation Needs: No Transportation Needs (04/18/2021)   PRAPARE - THydrologist(Medical): No    Lack of Transportation (Non-Medical): No  Physical Activity: Sufficiently Active (04/18/2021)   Exercise Vital Sign    Days of Exercise per Week: 3 days    Minutes of Exercise per Session: 60 min  Stress: No Stress Concern Present (04/18/2021)  [place] Questionnaire    Feeling of Stress : Only a little  Social Connections: Not on file   Past Surgical History:  Procedure Laterality Date   AV FISTULA PLACEMENT Right 12/05/2015   Procedure: INSERTION OF ARTERIOVENOUS (AV) GORE-TEX GRAFT ARM;  Surgeon: Elam Dutch, MD;  Location: [facility];  Service: Vascular;  Laterality: Right;   BONE MARROW BIOPSY     CATARACT EXTRACTION Right 09/2019   Dr. Herbert Deaner   CATARACT EXTRACTION Left 10/2019   Dr. Herbert Deaner   CHOLECYSTECTOMY     CHOLECYSTECTOMY  [phone]?   COLONOSCOPY WITH PROPOFOL N/A 01/22/2017   Procedure: COLONOSCOPY WITH PROPOFOL;  Surgeon: Wilford Corner, MD;  Location: [facility];  Service: Endoscopy;  Laterality: N/A;   EYE SURGERY Bilateral    Cat Sx - Dr. Herbert Deaner   INSERTION OF DIALYSIS CATHETER Right 10/16/2015   Procedure: INSERTION OF PALINDROME DIALYSIS CATHETER ;   Surgeon: Elam Dutch, MD;  Location: [facility];  Service: Vascular;  Laterality: Right;   OTHER SURGICAL HISTORY     Retinal surgery   PARS PLANA VITRECTOMY  02/17/2012   Procedure: PARS PLANA VITRECTOMY WITH 25 GAUGE;  Surgeon: Hayden Pedro, MD;  Location: [facility];  Service: Ophthalmology;  Laterality: Right;   PERIPHERAL VASCULAR CATHETERIZATION N/A 03/20/2016   Procedure: A/V Shuntogram/Fistulagram;  Surgeon: Conrad [place], MD;  Location: [facility];  Service: Cardiovascular;  Laterality: N/A;   TEE WITHOUT CARDIOVERSION N/A 12/02/2016   Procedure: TRANSESOPHAGEAL ECHOCARDIOGRAM (TEE);  Surgeon: Larey Dresser, MD;  Location: [facility];  Service: Cardiovascular;  Laterality: N/A;   Past Medical History:  Diagnosis Date   Abnormality of gait 63/84/6659   Acute metabolic encephalopathy 93/57/0177   Allergy 06/27/2019   AMS (altered mental status) 10/05/2019   Anaphylactic shock, unspecified 07/30/2021   Aphasia due to acute cerebrovascular accident 02/28/2016   Aspiration pneumonia 06/09/2019   Bacteremia    Bilateral kidney stones 05/31/2019   Chronic intracranial subdural hematoma 06/09/2019   COPD (chronic obstructive pulmonary disease) 11/11/2016   CVA (cerebral vascular accident)    2017 and 2021; R-MCA, L-MCA, PCA and bilateral cerebellar complicated by DVT/PE   Delusional thoughts, secondary to stroke 2021   Diabetic retinopathy    PDR OU   Dysphagia, oropharyngeal phase 07/05/2019   ESRD (end stage renal disease) on dialysis    Essential hypertension 06/09/2019   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   GERD (gastroesophageal reflux disease) 11/11/2016   GIB (gastrointestinal bleeding)    Recurrent episodes- 09/2014, 09/2015 and 05/2016   Gout    Hammer toe, acquired 03/03/2016   Headache 09/28/2019   Hemiparesis affecting right side as late effect of cerebrovascular accident 02/28/2016   History of DVT (deep vein thrombosis) 01/08/2016   History of  echocardiogram 08/2021   mod septal LVH o/w mild conc LVH, EF 55-60, no RWMA, Gr 2 DD, normal RVSF, severe LAE, trivial MR   History of ischemic left MCA stroke 01/08/2016   History of recent blood transfusion 10/27/2014   2 Units PRBC's   Hyperkalemia 07/2011   Hyperparathyroidism, secondary renal 11/11/2016   Hypocalcemia 12/26/2015   Hypotension 11/11/2016   Hypothyroidism 07/15/2019   Iron deficiency anemia, unspecified 01/22/2017   Ischemic colitis    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   Labile blood glucose    Labile blood pressure    Leukocytosis 01/08/2016   Multiple myeloma 05/17/2018   Onychomycosis due to dermatophyte 03/03/2016  Other specified coagulation defects 11/02/2015   PAF (paroxysmal atrial fibrillation)    Partial idiopathic epilepsy with seizures of localized onset, intractable, without status epilepticus 11/02/2017   Patent foramen ovale 11/11/2016   Proteus infection 05/28/2019   Pruritus, unspecified 11/02/2015   Pulmonary hypertension 11/11/2016    By echo 09/2014  RHC on 04/21/19 for further investigation of the nature of his elevated RVSP estimates on non-invasive imaging. His RHC (performed on a Thursday following HD on a Wednesday) demonstrated:  RA 10, RV 67/9, mPA 38, PCWP 19, CO 9.6, CI 4.5, PVR 2.0.   These findings are consistent with mild elevation in intracardiac filling pressures in the setting of high cardiac output in the setti   Right sided weakness 01/08/2016   Sepsis    Shortness of breath 11/02/2015   Sickle cell-thalassemia disease    a. Sickle cell trait.   Spastic hemiparesis 07/12/2019   Type 2 diabetes mellitus with hyperglycemia, without long-term current use of insulin    Ulcerative colitis 03/15/2020   Vascular dementia with behavioral disturbance ([place]) 11/05/2021   VTE (venous thromboembolism) 11/11/2016   DVT + PE   BP (!) 173/93   Pulse 65   Ht 5' 11"  (1.803 m)   SpO2 96%   BMI 27.48 kg/m   Opioid Risk  Score:   Fall Risk Score:  `1  Depression screen [name] 2/9     04/22/2022    9:33 AM 04/18/2021    2:58 PM 09/27/2020   11:27 AM 04/12/2020    3:35 PM 03/08/2020   11:31 AM 09/22/2019   11:53 AM 09/13/2019    9:57 AM  Depression screen PHQ 2/9  Decreased Interest 0 0 0 0 0 0 0  Down, Depressed, Hopeless 0 0 0 0 0 0 0  PHQ - 2 Score 0 0 0 0 0 0 0     Review of Systems  Constitutional: Negative.   HENT: Negative.    Eyes: Negative.   Respiratory: Negative.    Cardiovascular: Negative.   Gastrointestinal: Negative.   Endocrine: Negative.   Genitourinary: Negative.   Musculoskeletal:  Positive for back pain.  Skin: Negative.   Allergic/Immunologic: Negative.   Neurological: Negative.   Hematological: Negative.   Psychiatric/Behavioral: Negative.        Objective:   Physical Exam Vitals and nursing note reviewed.  Constitutional:      Appearance: He is normal weight.  HENT:     Head: Normocephalic and atraumatic.  Eyes:     Extraocular Movements: Extraocular movements intact.     Conjunctiva/sclera: Conjunctivae normal.     Pupils: Pupils are equal, round, and reactive to light.  Musculoskeletal:     Comments: No pain with upper extremity or lower extremity range of motion negative straight leg raising bilaterally  Skin:    General: Skin is warm and dry.  Neurological:     Mental Status: He is alert and oriented to person, place, and time.     Comments: Motor strength is 4+ in the right deltoid bicep tricep grip as well as hip flexor knee extensor ankle dorsiflexor 5/5 in the left deltoid bicep tricep grip as well as hip flexor knee extensor ankle dorsiflexor Sensation reports equal light touch bilateral upper limbs Fine motor reduced finger thumb opposition right upper extremity Tone increased finger flexors tone isolated to the right index finger.  MAS 2  Psychiatric:        Mood and Affect: Mood normal.  Behavior: Behavior normal.           Assessment &  Plan:   1.  Right spastic hemiparesis mild, nonprogressive related to prior hemorrhagic strokes greater than 5 years ago.  SDH was on the right side but there are no left-sided neurologic deficits.  The patient does have vascular dementia associated with multiple infarcts and likely exacerbated by history of subdural hematoma. Functionally the patient is maintaining his supervision level with mobility and ADLs.  As expected with dementia there are some days that are worse than others.  Fortunately this is only about once a week. We will see the patient back in approximately 1 year.

## 2022-04-23 ENCOUNTER — Other Ambulatory Visit (HOSPITAL_COMMUNITY): Payer: Self-pay

## 2022-04-23 DIAGNOSIS — D631 Anemia in chronic kidney disease: Secondary | ICD-10-CM | POA: Diagnosis not present

## 2022-04-23 DIAGNOSIS — Z992 Dependence on renal dialysis: Secondary | ICD-10-CM | POA: Diagnosis not present

## 2022-04-23 DIAGNOSIS — N186 End stage renal disease: Secondary | ICD-10-CM | POA: Diagnosis not present

## 2022-04-23 DIAGNOSIS — N2581 Secondary hyperparathyroidism of renal origin: Secondary | ICD-10-CM | POA: Diagnosis not present

## 2022-04-24 ENCOUNTER — Other Ambulatory Visit (HOSPITAL_COMMUNITY): Payer: Self-pay

## 2022-04-24 DIAGNOSIS — R2689 Other abnormalities of gait and mobility: Secondary | ICD-10-CM | POA: Diagnosis not present

## 2022-04-24 DIAGNOSIS — G8110 Spastic hemiplegia affecting unspecified side: Secondary | ICD-10-CM | POA: Diagnosis not present

## 2022-04-25 DIAGNOSIS — N186 End stage renal disease: Secondary | ICD-10-CM | POA: Diagnosis not present

## 2022-04-25 DIAGNOSIS — N2581 Secondary hyperparathyroidism of renal origin: Secondary | ICD-10-CM | POA: Diagnosis not present

## 2022-04-25 DIAGNOSIS — D631 Anemia in chronic kidney disease: Secondary | ICD-10-CM | POA: Diagnosis not present

## 2022-04-25 DIAGNOSIS — Z992 Dependence on renal dialysis: Secondary | ICD-10-CM | POA: Diagnosis not present

## 2022-04-28 DIAGNOSIS — N2581 Secondary hyperparathyroidism of renal origin: Secondary | ICD-10-CM | POA: Diagnosis not present

## 2022-04-28 DIAGNOSIS — N186 End stage renal disease: Secondary | ICD-10-CM | POA: Diagnosis not present

## 2022-04-28 DIAGNOSIS — Z992 Dependence on renal dialysis: Secondary | ICD-10-CM | POA: Diagnosis not present

## 2022-04-28 DIAGNOSIS — D631 Anemia in chronic kidney disease: Secondary | ICD-10-CM | POA: Diagnosis not present

## 2022-04-30 ENCOUNTER — Other Ambulatory Visit (HOSPITAL_COMMUNITY): Payer: Self-pay

## 2022-04-30 ENCOUNTER — Other Ambulatory Visit: Payer: Self-pay | Admitting: Neurology

## 2022-04-30 DIAGNOSIS — Z992 Dependence on renal dialysis: Secondary | ICD-10-CM | POA: Diagnosis not present

## 2022-04-30 DIAGNOSIS — N186 End stage renal disease: Secondary | ICD-10-CM | POA: Diagnosis not present

## 2022-04-30 DIAGNOSIS — N2581 Secondary hyperparathyroidism of renal origin: Secondary | ICD-10-CM | POA: Diagnosis not present

## 2022-04-30 DIAGNOSIS — D631 Anemia in chronic kidney disease: Secondary | ICD-10-CM | POA: Diagnosis not present

## 2022-05-01 ENCOUNTER — Other Ambulatory Visit (HOSPITAL_COMMUNITY): Payer: Self-pay

## 2022-05-01 ENCOUNTER — Ambulatory Visit (INDEPENDENT_AMBULATORY_CARE_PROVIDER_SITE_OTHER): Payer: Medicare Other

## 2022-05-01 ENCOUNTER — Other Ambulatory Visit: Payer: Self-pay | Admitting: Internal Medicine

## 2022-05-01 VITALS — BP 142/62 | HR 75 | Temp 98.5°F | Ht 69.0 in | Wt 185.6 lb

## 2022-05-01 DIAGNOSIS — Z0000 Encounter for general adult medical examination without abnormal findings: Secondary | ICD-10-CM | POA: Diagnosis not present

## 2022-05-01 MED ORDER — LACOSAMIDE 200 MG PO TABS
ORAL_TABLET | ORAL | 3 refills | Status: AC
  Filled 2022-05-01: qty 8, 3d supply, fill #0
  Filled 2022-05-05: qty 198, 81d supply, fill #0

## 2022-05-01 MED ORDER — MIDODRINE HCL 10 MG PO TABS
10.0000 mg | ORAL_TABLET | Freq: Two times a day (BID) | ORAL | 1 refills | Status: AC
  Filled 2022-05-01: qty 180, 90d supply, fill #0
  Filled 2022-05-05: qty 41, 21d supply, fill #0
  Filled 2022-05-08: qty 139, 69d supply, fill #0

## 2022-05-01 NOTE — Progress Notes (Signed)
Subjective:   Isaiah [name] is a 56 y.o. male who presents for Medicare Annual/Subsequent preventive examination.  Review of Systems     Cardiac Risk Factors include: advanced age (>28mn, >>39women);diabetes mellitus;hypertension;male gender     Objective:    Today's Vitals   05/01/22 0857 05/01/22 0926  BP: (!) 160/90 (!) 142/62  Pulse: 75   Temp: 98.5 F (36.9 C)   TempSrc: Oral   SpO2: 94%   Weight: 185 lb 9.6 oz (84.2 kg)   Height: 5' 9"  (1.753 m)    Body mass index is 27.41 kg/m.     05/01/2022    9:13 AM 02/04/2022    1:35 PM 10/08/2021   11:37 AM 06/13/2021   10:02 AM 04/18/2021    2:54 PM 03/12/2021    8:26 AM 12/31/2020    1:00 PM  Advanced Directives  Does Patient Have a Medical Advance Directive? Yes Yes Yes Yes Yes Yes Yes  Type of AParamedicof AParksleyLiving will HSutherlandOut of facility DNR (pink MOST or yellow form);Living will HWhite OakLiving will [place]fieldLiving will;Out of facility DNR (pink MOST or yellow form) [place]Living will H[place] Falls[place] of facility DNR (pink MOST or yellow form);Living will HWalla Walla EastLiving will  Does patient want to make changes to medical advance directive?       No - Patient declined  Copy of [place]tonin Chart? No - copy requested    No - copy requested      Current Medications (verified) Outpatient Encounter Medications as of 05/01/2022  Medication Sig   acetaminophen (TYLENOL) 325 MG tablet Take 1-2 tablets (325-650 mg total) by mouth every 4 (four) hours as needed for mild pain.   albuterol (PROAIR HFA) 108 (90 Base) MCG/ACT inhaler Inhale two puffs every 4-6 hours if needed for cough or wheeze   Alcohol Swabs (SM ALCOHOL PREP) 70 % PADS    atorvastatin (LIPITOR) 20 MG tablet TAKE 1 TABLET BY MOUTH ONCE A DAY   AURYXIA 1 GM 210 MG(Fe) tablet Take 210 mg by mouth  every evening.   calcitRIOL (ROCALTROL) 0.25 MCG capsule Take 0.25 mcg by mouth daily.   Cetirizine HCl 10 MG CAPS Take 1 capsule by mouth daily.    Cholecalciferol (D3-1000 PO) Take 1 tablet by mouth See admin instructions. Take one tablet by mouth on Monday,Wednesday and Fridays   clonazePAM (KLONOPIN) 0.5 MG tablet Take 2 tablets in morning on Monday, Wednesday, Friday before dialysis   donepezil (ARICEPT) 5 MG tablet Take 1/2 tablet daily for 2 weeks, then increase to 1 tablet daily and continue   escitalopram (LEXAPRO) 10 MG tablet Take 1 tablet (10 mg total) by mouth daily.   ferric citrate (AURYXIA) 1 GM 210 MG(Fe) tablet Take 1 tablet by mouth 3 times a day with meals as directed   fluticasone (FLONASE) 50 MCG/ACT nasal spray Place 1 spray into both nostrils as needed for allergies.   fluticasone furoate-vilanterol (BREO ELLIPTA) 200-25 MCG/ACT AEPB Inhale 1 puff by mouth into the lungs at the same time everyday   Glucagon, rDNA, (GLUCAGON EMERGENCY) 1 MG KIT See admin instructions.   glucose blood (FREESTYLE LITE) test strip Use as directed to test blood glucose levels 3 times daily.   hydrOXYzine (ATARAX) 25 MG tablet Take 1 tablet (25 mg total) by mouth as needed.   insulin glargine-yfgn (SEMGLEE, YFGN,) 100 UNIT/ML Pen  Inject 6 units under the skin once daily (discard pen after 28 days from first use)   insulin glargine-yfgn (SEMGLEE, YFGN,) 100 UNIT/ML Pen Inject 6 units under the skin once daily (discard pen after 28 days from first use)   Insulin Glargine-yfgn (SEMGLEE, YFGN,) 100 UNIT/ML SOPN Inject 6 units subcutaneously once a day   insulin lispro (HUMALOG KWIKPEN) 100 UNIT/ML KwikPen Inject 3 to 4 units under the skin three times daily as needed   Insulin Pen Needle 32G X 4 MM MISC USE AS DIRECTED 4 TO 5 TIMES DAILY TO INJECT INSULIN   lacosamide (VIMPAT) 200 MG TABS tablet TAKE 1 TABLET TWICE A DAY EXCEPT ON DIALYSIS DAYS M-W-F GIVE 1 TAB IN AM, 1 TAB AFTER DIALYSIS, 1 TAB IN  PM.   lamoTRIgine (LAMICTAL) 200 MG tablet TAKE 1 TABLET BY MOUTH 2 TIMES DAILY   Lancets (FREESTYLE) lancets    Methoxy PEG-Epoetin Beta (MIRCERA IJ)    metoprolol succinate (TOPROL-XL) 25 MG 24 hr tablet Take 25 mg by mouth 2 (two) times daily.   midodrine (PROAMATINE) 10 MG tablet Take 1 tablet by mouth 2 times daily.   multivitamin (RENA-VIT) TABS tablet Take 1 tablet by mouth at bedtime.   pantoprazole (PROTONIX) 40 MG tablet TAKE 1 TABLET BY MOUTH ONCE DAILY   prochlorperazine (COMPAZINE) 10 MG tablet Take 10 mg by mouth every 6 (six) hours as needed for nausea.   UNIFINE PENTIPS 31G X 8 MM MISC USE AS DIRECTED DAILY AFTER SUPPER   ferric citrate (AURYXIA) 1 GM 210 MG(Fe) tablet Take 1 tablet (210 mg total) by mouth 3 (three) times daily with meals as directed   FREESTYLE LITE test strip    [DISCONTINUED] lacosamide (VIMPAT) 200 MG TABS tablet TAKE 1 TABLET TWICE A DAY EXCEPT ON DIALYSIS DAYS M-W-F GIVE 1 TAB IN AM, 1 TAB AFTER DIALYSIS, 1 TAB IN PM.   No facility-administered encounter medications on file as of 05/01/2022.    Allergies (verified) Codeine and Codeine   History: Past Medical History:  Diagnosis Date   Abnormality of gait 58/59/2924   Acute metabolic encephalopathy 46/28/6381   Allergy 06/27/2019   AMS (altered mental status) 10/05/2019   Anaphylactic shock, unspecified 07/30/2021   Aphasia due to acute cerebrovascular accident 02/28/2016   Aspiration pneumonia 06/09/2019   Bacteremia    Bilateral kidney stones 05/31/2019   Chronic intracranial subdural hematoma 06/09/2019   COPD (chronic obstructive pulmonary disease) 11/11/2016   CVA (cerebral vascular accident)    2017 and 2021; R-MCA, L-MCA, PCA and bilateral cerebellar complicated by DVT/PE   Delusional thoughts, secondary to stroke 2021   Diabetic retinopathy    PDR OU   Dysphagia, oropharyngeal phase 07/05/2019   ESRD (end stage renal disease) on dialysis    Essential hypertension 06/09/2019    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   GERD (gastroesophageal reflux disease) 11/11/2016   GIB (gastrointestinal bleeding)    Recurrent episodes- 09/2014, 09/2015 and 05/2016   Gout    Hammer toe, acquired 03/03/2016   Headache 09/28/2019   Hemiparesis affecting right side as late effect of cerebrovascular accident 02/28/2016   History of DVT (deep vein thrombosis) 01/08/2016   History of echocardiogram 08/2021   mod septal LVH o/w mild conc LVH, EF 55-60, no RWMA, Gr 2 DD, normal RVSF, severe LAE, trivial MR   History of ischemic left MCA stroke 01/08/2016   History of recent blood transfusion 10/27/2014   2 Units PRBC's   Hyperkalemia 07/2011   Hyperparathyroidism, secondary  renal 11/11/2016   Hypocalcemia 12/26/2015   Hypotension 11/11/2016   Hypothyroidism 07/15/2019   Iron deficiency anemia, unspecified 01/22/2017   Ischemic colitis    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   Labile blood glucose    Labile blood pressure    Leukocytosis 01/08/2016   Multiple myeloma 05/17/2018   Onychomycosis due to dermatophyte 03/03/2016   Other specified coagulation defects 11/02/2015   PAF (paroxysmal atrial fibrillation)    Partial idiopathic epilepsy with seizures of localized onset, intractable, without status epilepticus 11/02/2017   Patent foramen ovale 11/11/2016   Proteus infection 05/28/2019   Pruritus, unspecified 11/02/2015   Pulmonary hypertension 11/11/2016    By echo 09/2014  RHC on 04/21/19 for further investigation of the nature of his elevated RVSP estimates on non-invasive imaging. His RHC (performed on a Thursday following HD on a Wednesday) demonstrated:  RA 10, RV 67/9, mPA 38, PCWP 19, CO 9.6, CI 4.5, PVR 2.0.   These findings are consistent with mild elevation in intracardiac filling pressures in the setting of high cardiac output in the setti   Right sided weakness 01/08/2016   Sepsis    Shortness of breath 11/02/2015   Sickle cell-thalassemia disease     a. Sickle cell trait.   Spastic hemiparesis 07/12/2019   Type 2 diabetes mellitus with hyperglycemia, without long-term current use of insulin    Ulcerative colitis 03/15/2020   Vascular dementia with behavioral disturbance ([place]) 11/05/2021   VTE (venous thromboembolism) 11/11/2016   DVT + PE   Past Surgical History:  Procedure Laterality Date   AV FISTULA PLACEMENT Right 12/05/2015   Procedure: INSERTION OF ARTERIOVENOUS (AV) GORE-TEX GRAFT ARM;  Surgeon: Elam Dutch, MD;  Location: [facility];  Service: Vascular;  Laterality: Right;   BONE MARROW BIOPSY     CATARACT EXTRACTION Right 09/2019   Dr. Herbert Deaner   CATARACT EXTRACTION Left 10/2019   Dr. Herbert Deaner   CHOLECYSTECTOMY     CHOLECYSTECTOMY  [phone]?   COLONOSCOPY WITH PROPOFOL N/A 01/22/2017   Procedure: COLONOSCOPY WITH PROPOFOL;  Surgeon: Wilford Corner, MD;  Location: [facility];  Service: Endoscopy;  Laterality: N/A;   EYE SURGERY Bilateral    Cat Sx - Dr. Herbert Deaner   INSERTION OF DIALYSIS CATHETER Right 10/16/2015   Procedure: INSERTION OF PALINDROME DIALYSIS CATHETER ;  Surgeon: Elam Dutch, MD;  Location: [facility];  Service: Vascular;  Laterality: Right;   OTHER SURGICAL HISTORY     Retinal surgery   PARS PLANA VITRECTOMY  02/17/2012   Procedure: PARS PLANA VITRECTOMY WITH 25 GAUGE;  Surgeon: Hayden Pedro, MD;  Location: [facility];  Service: Ophthalmology;  Laterality: Right;   PERIPHERAL VASCULAR CATHETERIZATION N/A 03/20/2016   Procedure: A/V Shuntogram/Fistulagram;  Surgeon: Conrad [place]more [place], MD;  Location: [facility];  Service: Cardiovascular;  Laterality: N/A;   TEE WITHOUT CARDIOVERSION N/A 12/02/2016   Procedure: TRANSESOPHAGEAL ECHOCARDIOGRAM (TEE);  Surgeon: Larey Dresser, MD;  Location: [name];  Service: Cardiovascular;  Laterality: N/A;   Family History  Problem Relation Age of Onset   Hypertension Mother    Diabetes Mother    Hypertension Father    Stroke Father    Sickle cell anemia Brother    Sickle  cell anemia Brother    Diabetes type I Brother    Kidney disease Brother    Multiple myeloma Maternal Aunt    Mental illness Maternal Aunt        visual hallucinations of unknown origin  Social History   Socioeconomic History   Marital status: Married    Spouse name: [name]   Number of children: 1   Years of education: 18   Highest education level: Master's degree (e.g., MA, MS, MEng, MEd, MSW, MBA)  Occupational History   Occupation: Disability    Comment: prior ADCytogeneticist of compliance  Tobacco Use   Smoking status: Never   Smokeless tobacco: Never  Vaping Use   Vaping Use: Never used  Substance and Sexual Activity   Alcohol use: Not Currently   Drug use: Never   Sexual activity: Yes    Partners: Female  Other Topics Concern   Not on file  Social History Narrative   ** Merged History Encounter **       Pt lives in 2 story home with his wife and 1 son   Has masters degree in psychology   Currently disabled.   Right handed       Social Determinants of Health   Financial Resource Strain: Low Risk  (05/01/2022)   Overall Financial Resource Strain (CARDIA)    Difficulty of Paying Living Expenses: Not hard at all  Food Insecurity: No Food Insecurity (05/01/2022)   Hunger Vital Sign    Worried About Running Out of Food in the Last Year: Never true    Ran Out of Food in the Last Year: Never true  Transportation Needs: No Transportation Needs (05/01/2022)   PRAPARE - Hydrologist (Medical): No    Lack of Transportation (Non-Medical): No  Physical Activity: Insufficiently Active (05/01/2022)   Exercise Vital Sign    Days of Exercise per Week: 2 days    Minutes of Exercise per Session: 60 min  Stress: No Stress Concern Present (05/01/2022)   [place]    Feeling of Stress : Not at all  Social Connections: Not on file    Tobacco Counseling Counseling given: Not  Answered   Clinical Intake:  Pre-visit preparation completed: Yes  Pain : No/denies pain     Nutritional Status: BMI 25 -29 Overweight Nutritional Risks: Nausea/ vomitting/ diarrhea (some yesterday) Diabetes: Yes  How often do you need to have someone help you when you read instructions, pamphlets, or other written materials from your doctor or pharmacy?: 1 - Never What is the last grade level you completed in school?: masters degree  Diabetic? Yes Nutrition Risk Assessment:  Has the patient had any N/V/D within the last 2 months?  Yes  Does the patient have any non-healing wounds?  No  Has the patient had any unintentional weight loss or weight gain?  No   Diabetes:  Is the patient diabetic?  Yes  If diabetic, was a CBG obtained today?  No  Did the patient bring in their glucometer from home?  No  How often do you monitor your CBG's? frequently.   Financial Strains and Diabetes Management:  Are you having any financial strains with the device, your supplies or your medication? No .  Does the patient want to be seen by Chronic Care Management for management of their diabetes?  No  Would the patient like to be referred to a Nutritionist or for Diabetic Management?  No   Diabetic Exams:  Diabetic Eye Exam: Overdue for diabetic eye exam. Pt has been advised about the importance in completing this exam. Patient advised to call and schedule an eye exam. Diabetic Foot Exam: Completed requesting report from podiatrist  Interpreter Needed?: No  Information entered by :: NAllen LPN   Activities of Daily Living    05/01/2022    9:15 AM  In your present state of health, do you have any difficulty performing the following activities:  Hearing? 0  Vision? 0  Difficulty concentrating or making decisions? 1  Walking or climbing stairs? 1  Dressing or bathing? 1  Doing errands, shopping? 1  Preparing Food and eating ? Y  Using the Toilet? Y  In the past six months, have  you accidently leaked urine? N  Do you have problems with loss of bowel control? N  Managing your Medications? Y  Managing your Finances? Y  Housekeeping or managing your Housekeeping? Y    Patient Care Team: [name], MD as PCP - General (Internal Medicine) Sherren Mocha, MD as PCP - Cardiology (Cardiology) Estanislado Emms, MD (Inactive) as Consulting Physician (Nephrology) Marica Otter, [place] (Optometry) Hayden Pedro, MD as Consulting Physician (Ophthalmology) [name], MD (Internal Medicine) Cameron Sprang, MD as Consulting Physician (Neurology) Rex Kras, Claudette Stapler, RN as [place] Management Sharmon Revere as Physician Assistant (Cardiology)  Indicate any recent Medical Services you may have received from other than [facility] providers in the past year (date may be approximate).     Assessment:   This is a routine wellness examination for Amay.  Hearing/Vision screen Vision Screening - Comments:: Regular eye exams. Dr. Sabra Heck  Dietary issues and exercise activities discussed: Current Exercise Habits: Home exercise routine, Type of exercise: Other - see comments (pedal machine), Time (Minutes): 60, Frequency (Times/Week): 2, Weekly Exercise (Minutes/Week): 120   Goals Addressed             This Visit's Progress    Patient Stated       05/01/2022, do more exercise       Depression Screen    05/01/2022    9:14 AM 04/22/2022    9:33 AM 04/18/2021    2:58 PM 09/27/2020   11:27 AM 04/12/2020    3:35 PM 03/08/2020   11:31 AM 09/22/2019   11:53 AM  PHQ 2/9 Scores  PHQ - 2 Score 0 0 0 0 0 0 0    Fall Risk    05/01/2022    9:14 AM 04/22/2022    9:33 AM 02/04/2022    1:35 PM 10/08/2021   11:37 AM 06/13/2021   10:02 AM  Fall Risk   Falls in the past year? 1 1 1 1  0  Comment loses balance      Number falls in past yr: 1 1 1  0 0  Comment    08/31/21,   Injury with Fall? 0 0 0 0 0  Risk for fall due to : Impaired balance/gait;Impaired  mobility;Medication side effect      Follow up Falls evaluation completed;Education provided;Falls prevention discussed        FALL RISK PREVENTION PERTAINING TO THE HOME:  Any stairs in or around the home? Yes  If so, are there any without handrails? No  Home free of loose throw rugs in walkways, pet beds, electrical cords, etc? Yes  Adequate lighting in your home to reduce risk of falls? Yes   ASSISTIVE DEVICES UTILIZED TO PREVENT FALLS:  Life alert? No  Use of a cane, walker or w/c? Yes  Grab bars in the bathroom? Yes  Shower chair or bench in shower? Yes  Elevated toilet seat or a handicapped toilet? Yes   TIMED UP  AND GO:  Was the test performed? No .       Cognitive Function:    03/12/2021    9:00 AM 05/30/2020   12:00 PM  MMSE - Mini Mental State Exam  Orientation to time 1 3  Orientation to Place 3 5  Registration 3 3  Attention/ Calculation 2 1  Recall 2 3  Language- name 2 objects 2 2  Language- repeat 0 0  Language- follow 3 step command 3 3  Language- read & follow direction 1 1  Write a sentence 0 0  Copy design 0 0  Total score 17 21        04/18/2021    3:01 PM 08/27/2020    4:18 PM 04/12/2020    3:37 PM 03/17/2019   11:22 AM  6CIT Screen  What Year? 0 points 4 points 0 points 0 points  What month? 0 points 3 points 3 points 0 points  What time? 0 points 0 points 0 points 0 points  Count back from 20 0 points 2 points 0 points 0 points  Months in reverse 4 points  4 points 4 points  Repeat phrase 4 points  6 points 2 points  Total Score 8 points  13 points 6 points    Immunizations Immunization History  Administered Date(s) Administered   Hepatitis B, adult 11/16/2015, 12/24/2015, 01/21/2016, 05/23/2016, 10/21/2017   Influenza Split 10/13/2015   Influenza, Quadrivalent, Recombinant, Inj, Pf 07/15/2019   Influenza,inj,Quad PF,6+ Mos 10/13/2015, 07/15/2019, 07/18/2020   Influenza,inj,quad, With Preservative 06/07/2018   Influenza-Unspecified  07/09/2014, 06/08/2018   Moderna Sars-Covid-2 Vaccination 11/25/2019, 12/23/2019   PFIZER Comirnaty(Gray Top)Covid-19 Tri-Sucrose Vaccine 04/30/2021   PFIZER(Purple Top)SARS-COV-2 Vaccination 05/11/2020   PNEUMOCOCCAL CONJUGATE-20 04/29/2021   Pneumococcal Conjugate-13 12/14/2015   Tdap 02/06/2022   Zoster Recombinat (Shingrix) 02/06/2022    TDAP status: Up to date  Flu Vaccine status: Due, Education has been provided regarding the importance of this vaccine. Advised may receive this vaccine at local pharmacy or Health Dept. Aware to provide a copy of the vaccination record if obtained from local pharmacy or Health Dept. Verbalized acceptance and understanding.  Pneumococcal vaccine status: Up to date  Covid-19 vaccine status: Completed vaccines  Qualifies for Shingles Vaccine? Yes   Zostavax completed No   Shingrix Completed?: needs second dose  Screening Tests Health Maintenance  Topic Date Due   Hepatitis C Screening  Never done   COVID-19 Vaccine (5 - Mixed Product risk series) 06/25/2021   FOOT EXAM  07/19/2021   Zoster Vaccines- Shingrix (2 of 2) 04/03/2022   OPHTHALMOLOGY EXAM  04/11/2022   INFLUENZA VACCINE  07/08/2022 (Originally 04/08/2022)   HEMOGLOBIN A1C  07/10/2022   COLONOSCOPY (Pts 45-71yr Insurance coverage will need to be confirmed)  01/23/2027   TETANUS/TDAP  02/07/2032   HIV Screening  Completed   HPV VACCINES  Aged Out   URINE MICROALBUMIN  Discontinued    Health Maintenance  Health Maintenance Due  Topic Date Due   Hepatitis C Screening  Never done   COVID-19 Vaccine (5 - Mixed Product risk series) 06/25/2021   FOOT EXAM  07/19/2021   Zoster Vaccines- Shingrix (2 of 2) 04/03/2022   OPHTHALMOLOGY EXAM  04/11/2022    Colorectal cancer screening: Type of screening: Colonoscopy. Completed 01/22/2017. Repeat every 5 years  Lung Cancer Screening: (Low Dose CT Chest recommended if Age 58-80years, 30 pack-year currently smoking OR have quit w/in  15years.) does not qualify.   Lung Cancer Screening Referral: no  Additional Screening:  Hepatitis C Screening: does qualify;   Vision Screening: Recommended annual ophthalmology exams for early detection of glaucoma and other disorders of the eye. Is the patient up to date with their annual eye exam?  Yes  Who is the provider or what is the name of the office in which the patient attends annual eye exams? Dr. Marica Otter If pt is not established with a provider, would they like to be referred to a provider to establish care? No .   Dental Screening: Recommended annual dental exams for proper oral hygiene  Community Resource Referral / Chronic Care Management: CRR required this visit?  No   CCM required this visit?  No      Plan:     I have personally reviewed and noted the following in the patient's chart:   Medical and social history Use of alcohol, tobacco or illicit drugs  Current medications and supplements including opioid prescriptions. Patient is not currently taking opioid prescriptions. Functional ability and status Nutritional status Physical activity Advanced directives List of other physicians Hospitalizations, surgeries, and ER visits in previous 12 months Vitals Screenings to include cognitive, depression, and falls Referrals and appointments  In addition, I have reviewed and discussed with patient certain preventive protocols, quality metrics, and best practice recommendations. A written personalized care plan for preventive services as well as general preventive health recommendations were provided to patient.     Kellie Simmering, LPN   03/01/7627   Nurse Notes: 6 CIT not administered. Patient has a diagnosis of dementia and is followed by neurology.

## 2022-05-01 NOTE — Patient Instructions (Signed)
[name] , Thank you for taking time to come for your Medicare Wellness Visit. I appreciate your ongoing commitment to your health goals. Please review the following plan we discussed and let me know if I can assist you in the future.   Screening recommendations/referrals: Colonoscopy: completed 01/22/2017, due now Recommended yearly ophthalmology/optometry visit for glaucoma screening and checkup Recommended yearly dental visit for hygiene and checkup  Vaccinations: Influenza vaccine: due Pneumococcal vaccine: completed 04/29/2021 Tdap vaccine: requesting from pharmacy Shingles vaccine: requesting from pharmacy   Covid-19:  04/30/2021, 05/11/2020, 12/23/2019, 11/25/2019  Advanced directives: Please bring a copy of your POA (Power of Attorney) and/or Living Will to your next appointment.    Conditions/risks identified: none  Next appointment: Follow up in one year for your annual wellness visit   Preventive Care 40-64 Years, Male Preventive care refers to lifestyle choices and visits with your health care provider that can promote health and wellness. What does preventive care include? A yearly physical exam. This is also called an annual well check. Dental exams once or twice a year. Routine eye exams. Ask your health care provider how often you should have your eyes checked. Personal lifestyle choices, including: Daily care of your teeth and gums. Regular physical activity. Eating a healthy diet. Avoiding tobacco and drug use. Limiting alcohol use. Practicing safe sex. Taking low-dose aspirin every day starting at age 62. What happens during an annual well check? The services and screenings done by your health care provider during your annual well check will depend on your age, overall health, lifestyle risk factors, and family history of disease. Counseling  Your health care provider may ask you questions about your: Alcohol use. Tobacco use. Drug use. Emotional  well-being. Home and relationship well-being. Sexual activity. Eating habits. Work and work Statistician. Screening  You may have the following tests or measurements: Height, weight, and BMI. Blood pressure. Lipid and cholesterol levels. These may be checked every 5 years, or more frequently if you are over 12 years old. Skin check. Lung cancer screening. You may have this screening every year starting at age 22 if you have a 30-pack-year history of smoking and currently smoke or have quit within the past 15 years. Fecal occult blood test (FOBT) of the stool. You may have this test every year starting at age 63. Flexible sigmoidoscopy or colonoscopy. You may have a sigmoidoscopy every 5 years or a colonoscopy every 10 years starting at age 24. Prostate cancer screening. Recommendations will vary depending on your family history and other risks. Hepatitis C blood test. Hepatitis B blood test. Sexually transmitted disease (STD) testing. Diabetes screening. This is done by checking your blood sugar (glucose) after you have not eaten for a while (fasting). You may have this done every 1-3 years. Discuss your test results, treatment options, and if necessary, the need for more tests with your health care provider. Vaccines  Your health care provider may recommend certain vaccines, such as: Influenza vaccine. This is recommended every year. Tetanus, diphtheria, and acellular pertussis (Tdap, Td) vaccine. You may need a Td booster every 10 years. Zoster vaccine. You may need this after age 69. Pneumococcal 13-valent conjugate (PCV13) vaccine. You may need this if you have certain conditions and have not been vaccinated. Pneumococcal polysaccharide (PPSV23) vaccine. You may need one or two doses if you smoke cigarettes or if you have certain conditions. Talk to your health care provider about which screenings and vaccines you need and how often you need them.  This information is not intended to  replace advice given to you by your health care provider. Make sure you discuss any questions you have with your health care provider. Document Released: 09/21/2015 Document Revised: 05/14/2016 Document Reviewed: 06/26/2015 Elsevier Interactive Patient Education  2017 [employer] Prevention in the Home Falls can cause injuries. They can happen to people of all ages. There are many things you can do to make your home safe and to help prevent falls. What can I do on the outside of my home? Regularly fix the edges of walkways and driveways and fix any cracks. Remove anything that might make you trip as you walk through a door, such as a raised step or threshold. Trim any bushes or trees on the path to your home. Use bright outdoor lighting. Clear any walking paths of anything that might make someone trip, such as rocks or tools. Regularly check to see if handrails are loose or broken. Make sure that both sides of any steps have handrails. Any raised decks and porches should have guardrails on the edges. Have any leaves, snow, or ice cleared regularly. Use sand or salt on walking paths during winter. Clean up any spills in your garage right away. This includes oil or grease spills. What can I do in the bathroom? Use night lights. Install grab bars by the toilet and in the tub and shower. Do not use towel bars as grab bars. Use non-skid mats or decals in the tub or shower. If you need to sit down in the shower, use a plastic, non-slip stool. Keep the floor dry. Clean up any water that spills on the floor as soon as it happens. Remove soap buildup in the tub or shower regularly. Attach bath mats securely with double-sided non-slip rug tape. Do not have throw rugs and other things on the floor that can make you trip. What can I do in the bedroom? Use night lights. Make sure that you have a light by your bed that is easy to reach. Do not use any sheets or blankets that are too big for  your bed. They should not hang down onto the floor. Have a firm chair that has side arms. You can use this for support while you get dressed. Do not have throw rugs and other things on the floor that can make you trip. What can I do in the kitchen? Clean up any spills right away. Avoid walking on wet floors. Keep items that you use a lot in easy-to-reach places. If you need to reach something above you, use a strong step stool that has a grab bar. Keep electrical cords out of the way. Do not use floor polish or wax that makes floors slippery. If you must use wax, use non-skid floor wax. Do not have throw rugs and other things on the floor that can make you trip. What can I do with my stairs? Do not leave any items on the stairs. Make sure that there are handrails on both sides of the stairs and use them. Fix handrails that are broken or loose. Make sure that handrails are as long as the stairways. Check any carpeting to make sure that it is firmly attached to the stairs. Fix any carpet that is loose or worn. Avoid having throw rugs at the top or bottom of the stairs. If you do have throw rugs, attach them to the floor with carpet tape. Make sure that you have a light switch at the  top of the stairs and the bottom of the stairs. If you do not have them, ask someone to add them for you. What else can I do to help prevent falls? Wear shoes that: Do not have high heels. Have rubber bottoms. Are comfortable and fit you well. Are closed at the toe. Do not wear sandals. If you use a stepladder: Make sure that it is fully opened. Do not climb a closed stepladder. Make sure that both sides of the stepladder are locked into place. Ask someone to hold it for you, if possible. Clearly mark and make sure that you can see: Any grab bars or handrails. First and last steps. Where the edge of each step is. Use tools that help you move around (mobility aids) if they are needed. These  include: Canes. Walkers. Scooters. Crutches. Turn on the lights when you go into a dark area. Replace any light bulbs as soon as they burn out. Set up your furniture so you have a clear path. Avoid moving your furniture around. If any of your floors are uneven, fix them. If there are any pets around you, be aware of where they are. Review your medicines with your doctor. Some medicines can make you feel dizzy. This can increase your chance of falling. Ask your doctor what other things that you can do to help prevent falls. This information is not intended to replace advice given to you by your health care provider. Make sure you discuss any questions you have with your health care provider. Document Released: 06/21/2009 Document Revised: 01/31/2016 Document Reviewed: 09/29/2014 Elsevier Interactive Patient Education  2017 [employer].

## 2022-05-02 DIAGNOSIS — Z992 Dependence on renal dialysis: Secondary | ICD-10-CM | POA: Diagnosis not present

## 2022-05-02 DIAGNOSIS — D631 Anemia in chronic kidney disease: Secondary | ICD-10-CM | POA: Diagnosis not present

## 2022-05-02 DIAGNOSIS — N186 End stage renal disease: Secondary | ICD-10-CM | POA: Diagnosis not present

## 2022-05-02 DIAGNOSIS — N2581 Secondary hyperparathyroidism of renal origin: Secondary | ICD-10-CM | POA: Diagnosis not present

## 2022-05-03 ENCOUNTER — Other Ambulatory Visit (HOSPITAL_COMMUNITY): Payer: Self-pay

## 2022-05-05 ENCOUNTER — Other Ambulatory Visit (HOSPITAL_COMMUNITY): Payer: Self-pay

## 2022-05-05 DIAGNOSIS — Z992 Dependence on renal dialysis: Secondary | ICD-10-CM | POA: Diagnosis not present

## 2022-05-05 DIAGNOSIS — N2581 Secondary hyperparathyroidism of renal origin: Secondary | ICD-10-CM | POA: Diagnosis not present

## 2022-05-05 DIAGNOSIS — D631 Anemia in chronic kidney disease: Secondary | ICD-10-CM | POA: Diagnosis not present

## 2022-05-05 DIAGNOSIS — N186 End stage renal disease: Secondary | ICD-10-CM | POA: Diagnosis not present

## 2022-05-06 ENCOUNTER — Encounter: Payer: Self-pay | Admitting: Internal Medicine

## 2022-05-06 ENCOUNTER — Ambulatory Visit (INDEPENDENT_AMBULATORY_CARE_PROVIDER_SITE_OTHER): Payer: Medicare Other | Admitting: Internal Medicine

## 2022-05-06 ENCOUNTER — Other Ambulatory Visit (HOSPITAL_COMMUNITY): Payer: Self-pay

## 2022-05-06 VITALS — BP 128/80 | HR 64 | Temp 98.1°F | Ht 69.0 in | Wt 187.2 lb

## 2022-05-06 DIAGNOSIS — R351 Nocturia: Secondary | ICD-10-CM | POA: Diagnosis not present

## 2022-05-06 DIAGNOSIS — Z0000 Encounter for general adult medical examination without abnormal findings: Secondary | ICD-10-CM

## 2022-05-06 DIAGNOSIS — F01518 Vascular dementia, unspecified severity, with other behavioral disturbance: Secondary | ICD-10-CM | POA: Diagnosis not present

## 2022-05-06 DIAGNOSIS — H524 Presbyopia: Secondary | ICD-10-CM | POA: Diagnosis not present

## 2022-05-06 DIAGNOSIS — Z993 Dependence on wheelchair: Secondary | ICD-10-CM

## 2022-05-06 DIAGNOSIS — R39198 Other difficulties with micturition: Secondary | ICD-10-CM

## 2022-05-06 DIAGNOSIS — N2581 Secondary hyperparathyroidism of renal origin: Secondary | ICD-10-CM | POA: Diagnosis not present

## 2022-05-06 DIAGNOSIS — Z992 Dependence on renal dialysis: Secondary | ICD-10-CM | POA: Diagnosis not present

## 2022-05-06 DIAGNOSIS — I480 Paroxysmal atrial fibrillation: Secondary | ICD-10-CM

## 2022-05-06 DIAGNOSIS — E1122 Type 2 diabetes mellitus with diabetic chronic kidney disease: Secondary | ICD-10-CM | POA: Diagnosis not present

## 2022-05-06 DIAGNOSIS — J42 Unspecified chronic bronchitis: Secondary | ICD-10-CM

## 2022-05-06 DIAGNOSIS — I639 Cerebral infarction, unspecified: Secondary | ICD-10-CM

## 2022-05-06 DIAGNOSIS — N186 End stage renal disease: Secondary | ICD-10-CM

## 2022-05-06 DIAGNOSIS — Z961 Presence of intraocular lens: Secondary | ICD-10-CM | POA: Diagnosis not present

## 2022-05-06 DIAGNOSIS — H6123 Impacted cerumen, bilateral: Secondary | ICD-10-CM

## 2022-05-06 DIAGNOSIS — I272 Pulmonary hypertension, unspecified: Secondary | ICD-10-CM

## 2022-05-06 DIAGNOSIS — I120 Hypertensive chronic kidney disease with stage 5 chronic kidney disease or end stage renal disease: Secondary | ICD-10-CM | POA: Diagnosis not present

## 2022-05-06 DIAGNOSIS — E11311 Type 2 diabetes mellitus with unspecified diabetic retinopathy with macular edema: Secondary | ICD-10-CM | POA: Diagnosis not present

## 2022-05-06 DIAGNOSIS — G8110 Spastic hemiplegia affecting unspecified side: Secondary | ICD-10-CM

## 2022-05-06 DIAGNOSIS — C9000 Multiple myeloma not having achieved remission: Secondary | ICD-10-CM

## 2022-05-06 DIAGNOSIS — H5213 Myopia, bilateral: Secondary | ICD-10-CM | POA: Diagnosis not present

## 2022-05-06 DIAGNOSIS — Z125 Encounter for screening for malignant neoplasm of prostate: Secondary | ICD-10-CM

## 2022-05-06 DIAGNOSIS — Z9849 Cataract extraction status, unspecified eye: Secondary | ICD-10-CM | POA: Diagnosis not present

## 2022-05-06 NOTE — Patient Instructions (Signed)
Health Maintenance, Male Adopting a healthy lifestyle and getting preventive care are important in promoting health and wellness. Ask your health care provider about: The right schedule for you to have regular tests and exams. Things you can do on your own to prevent diseases and keep yourself healthy. What should I know about diet, weight, and exercise? Eat a healthy diet  Eat a diet that includes plenty of vegetables, fruits, low-fat dairy products, and lean protein. Do not eat a lot of foods that are high in solid fats, added sugars, or sodium. Maintain a healthy weight Body mass index (BMI) is a measurement that can be used to identify possible weight problems. It estimates body fat based on height and weight. Your health care provider can help determine your BMI and help you achieve or maintain a healthy weight. Get regular exercise Get regular exercise. This is one of the most important things you can do for your health. Most adults should: Exercise for at least 150 minutes each week. The exercise should increase your heart rate and make you sweat (moderate-intensity exercise). Do strengthening exercises at least twice a week. This is in addition to the moderate-intensity exercise. Spend less time sitting. Even light physical activity can be beneficial. Watch cholesterol and blood lipids Have your blood tested for lipids and cholesterol at 58 years of age, then have this test every 5 years. You may need to have your cholesterol levels checked more often if: Your lipid or cholesterol levels are high. You are older than 58 years of age. You are at high risk for heart disease. What should I know about cancer screening? Many types of cancers can be detected early and may often be prevented. Depending on your health history and family history, you may need to have cancer screening at various ages. This may include screening for: Colorectal cancer. Prostate cancer. Skin cancer. Lung  cancer. What should I know about heart disease, diabetes, and high blood pressure? Blood pressure and heart disease High blood pressure causes heart disease and increases the risk of stroke. This is more likely to develop in people who have high blood pressure readings or are overweight. Talk with your health care provider about your target blood pressure readings. Have your blood pressure checked: Every 3-5 years if you are 18-39 years of age. Every year if you are 40 years old or older. If you are between the ages of 65 and 75 and are a current or former smoker, ask your health care provider if you should have a one-time screening for abdominal aortic aneurysm (AAA). Diabetes Have regular diabetes screenings. This checks your fasting blood sugar level. Have the screening done: Once every three years after age 45 if you are at a normal weight and have a low risk for diabetes. More often and at a younger age if you are overweight or have a high risk for diabetes. What should I know about preventing infection? Hepatitis B If you have a higher risk for hepatitis B, you should be screened for this virus. Talk with your health care provider to find out if you are at risk for hepatitis B infection. Hepatitis C Blood testing is recommended for: Everyone born from 1945 through 1965. Anyone with known risk factors for hepatitis C. Sexually transmitted infections (STIs) You should be screened each year for STIs, including gonorrhea and chlamydia, if: You are sexually active and are younger than 58 years of age. You are older than 58 years of age and your   health care provider tells you that you are at risk for this type of infection. Your sexual activity has changed since you were last screened, and you are at increased risk for chlamydia or gonorrhea. Ask your health care provider if you are at risk. Ask your health care provider about whether you are at high risk for HIV. Your health care provider  may recommend a prescription medicine to help prevent HIV infection. If you choose to take medicine to prevent HIV, you should first get tested for HIV. You should then be tested every 3 months for as long as you are taking the medicine. Follow these instructions at home: Alcohol use Do not drink alcohol if your health care provider tells you not to drink. If you drink alcohol: Limit how much you have to 0-2 drinks a day. Know how much alcohol is in your drink. In the [place] one drink equals one 12 oz bottle of beer (355 mL), one 5 oz glass of wine (148 mL), or one 1 oz glass of hard liquor (44 mL). Lifestyle Do not use any products that contain nicotine or tobacco. These products include cigarettes, chewing tobacco, and vaping devices, such as e-cigarettes. If you need help quitting, ask your health care provider. Do not use street drugs. Do not share needles. Ask your health care provider for help if you need support or information about quitting drugs. General instructions Schedule regular health, dental, and eye exams. Stay current with your vaccines. Tell your health care provider if: You often feel depressed. You have ever been abused or do not feel safe at home. Summary Adopting a healthy lifestyle and getting preventive care are important in promoting health and wellness. Follow your health care provider's instructions about healthy diet, exercising, and getting tested or screened for diseases. Follow your health care provider's instructions on monitoring your cholesterol and blood pressure. This information is not intended to replace advice given to you by your health care provider. Make sure you discuss any questions you have with your health care provider. Document Revised: 01/14/2021 Document Reviewed: 01/14/2021 Elsevier Patient Education  2023 [employer]  

## 2022-05-06 NOTE — Progress Notes (Signed)
I,Isaiah [name],acting as a scribe for Isaiah Greenland, MD.,have documented all relevant documentation on the behalf of Isaiah Greenland, MD,as directed by  Isaiah Greenland, MD while in the presence of Isaiah Greenland, MD.    Subjective:     Patient ID: Isaiah [name] , male    DOB: 02/18/1966 , 56 y.o.   MRN: [id number]   Chief Complaint  Patient presents with   Annual Exam   Diabetes   Hypertension    HPI  Patient presents today for a physical exam. He has primary insurance as Medicare. His secondary is UMR. He is accompanied by his wife today. He is also followed by Dr.Balan for his diabetes. He offers no new complaints at this time. He is still participating in dialysis three days per week, MWF.   Diabetes He presents for his follow-up diabetic visit. He has type 2 diabetes mellitus. His disease course has been stable. There are no hypoglycemic associated symptoms. Pertinent negatives for diabetes include no blurred vision, no polydipsia, no polyphagia and no polyuria. There are no hypoglycemic complications. Diabetic complications include nephropathy. Risk factors for coronary artery disease include diabetes mellitus, dyslipidemia, hypertension, male sex and obesity. He is following a diabetic diet.  Hypertension This is a chronic problem. The current episode started more than 1 year ago. The problem has been gradually improving since onset. The problem is controlled. Pertinent negatives include no anxiety or blurred vision. The current treatment provides moderate improvement.     Past Medical History:  Diagnosis Date   Abnormality of gait 09/38/1829   Acute metabolic encephalopathy 93/71/6967   Allergy 06/27/2019   AMS (altered mental status) 10/05/2019   Anaphylactic shock, unspecified 07/30/2021   Aphasia due to acute cerebrovascular accident 02/28/2016   Aspiration pneumonia 06/09/2019   Bacteremia    Bilateral kidney stones 05/31/2019   Chronic intracranial subdural  hematoma 06/09/2019   COPD (chronic obstructive pulmonary disease) 11/11/2016   CVA (cerebral vascular accident)    2017 and 2021; R-MCA, L-MCA, PCA and bilateral cerebellar complicated by DVT/PE   Delusional thoughts, secondary to stroke 2021   Diabetic retinopathy    PDR OU   Dysphagia, oropharyngeal phase 07/05/2019   ESRD (end stage renal disease) on dialysis    Essential hypertension 06/09/2019   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   GERD (gastroesophageal reflux disease) 11/11/2016   GIB (gastrointestinal bleeding)    Recurrent episodes- 09/2014, 09/2015 and 05/2016   Gout    Hammer toe, acquired 03/03/2016   Headache 09/28/2019   Hemiparesis affecting right side as late effect of cerebrovascular accident 02/28/2016   History of DVT (deep vein thrombosis) 01/08/2016   History of echocardiogram 08/2021   mod septal LVH o/w mild conc LVH, EF 55-60, no RWMA, Gr 2 DD, normal RVSF, severe LAE, trivial MR   History of ischemic left MCA stroke 01/08/2016   History of recent blood transfusion 10/27/2014   2 Units PRBC's   Hyperkalemia 07/2011   Hyperparathyroidism, secondary renal 11/11/2016   Hypocalcemia 12/26/2015   Hypotension 11/11/2016   Hypothyroidism 07/15/2019   Iron deficiency anemia, unspecified 01/22/2017   Ischemic colitis    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   Labile blood glucose    Labile blood pressure    Leukocytosis 01/08/2016   Multiple myeloma 05/17/2018   Onychomycosis due to dermatophyte 03/03/2016   Other specified coagulation defects 11/02/2015   PAF (paroxysmal atrial fibrillation)    Partial idiopathic epilepsy with seizures of localized onset,  intractable, without status epilepticus 11/02/2017   Patent foramen ovale 11/11/2016   Proteus infection 05/28/2019   Pruritus, unspecified 11/02/2015   Pulmonary hypertension 11/11/2016    By echo 09/2014  RHC on 04/21/19 for further investigation of the nature of his elevated RVSP estimates  on non-invasive imaging. His RHC (performed on a Thursday following HD on a Wednesday) demonstrated:  RA 10, RV 67/9, mPA 38, PCWP 19, CO 9.6, CI 4.5, PVR 2.0.   These findings are consistent with mild elevation in intracardiac filling pressures in the setting of high cardiac output in the setti   Right sided weakness 01/08/2016   Sepsis    Shortness of breath 11/02/2015   Sickle cell-thalassemia disease    a. Sickle cell trait.   Spastic hemiparesis 07/12/2019   Type 2 diabetes mellitus with hyperglycemia, without long-term current use of insulin    Ulcerative colitis 03/15/2020   Vascular dementia with behavioral disturbance ([place]) 11/05/2021   VTE (venous thromboembolism) 11/11/2016   DVT + PE     Family History  Problem Relation Age of Onset   Hypertension Mother    Diabetes Mother    Hypertension Father    Stroke Father    Sickle cell anemia Brother    Sickle cell anemia Brother    Diabetes type I Brother    Kidney disease Brother    Multiple myeloma Maternal Aunt    Mental illness Maternal Aunt        visual hallucinations of unknown origin     Current Outpatient Medications:    acetaminophen (TYLENOL) 325 MG tablet, Take 1-2 tablets (325-650 mg total) by mouth every 4 (four) hours as needed for mild pain., Disp:  , Rfl:    albuterol (PROAIR HFA) 108 (90 Base) MCG/ACT inhaler, Inhale two puffs every 4-6 hours if needed for cough or wheeze, Disp: 6.7 g, Rfl: 2   Alcohol Swabs (SM ALCOHOL PREP) 70 % PADS, , Disp: , Rfl:    atorvastatin (LIPITOR) 20 MG tablet, TAKE 1 TABLET BY MOUTH ONCE A DAY (Patient taking differently: Take 20 mg by mouth daily.), Disp: 90 tablet, Rfl: 2   calcitRIOL (ROCALTROL) 0.25 MCG capsule, Take 0.25 mcg by mouth daily., Disp: , Rfl:    Cetirizine HCl 10 MG CAPS, Take 1 capsule by mouth daily. , Disp: , Rfl:    Cholecalciferol (D3-1000 PO), Take 1 tablet by mouth every Monday, Wednesday, and Friday with hemodialysis., Disp: , Rfl:    clonazePAM  (KLONOPIN) 0.5 MG tablet, Take 2 tablets in morning on Monday, Wednesday, Friday before dialysis, Disp: 60 tablet, Rfl: 5   donepezil (ARICEPT) 5 MG tablet, Take 1/2 tablet daily for 2 weeks, then increase to 1 tablet daily and continue (Patient taking differently: Take 5 mg by mouth daily.), Disp: 30 tablet, Rfl: 6   escitalopram (LEXAPRO) 10 MG tablet, Take 1 tablet (10 mg total) by mouth daily., Disp: 90 tablet, Rfl: 3   ferric citrate (AURYXIA) 1 GM 210 MG(Fe) tablet, Take 1 tablet (210 mg total) by mouth 3 (three) times daily with meals as directed, Disp: 270 tablet, Rfl: 3   fluticasone (FLONASE) 50 MCG/ACT nasal spray, Place 1 spray into both nostrils as needed for allergies. (Patient not taking: Reported on 05/22/2022), Disp: , Rfl:    fluticasone furoate-vilanterol (BREO ELLIPTA) 200-25 MCG/ACT AEPB, Inhale 1 puff by mouth into the lungs at the same time everyday, Disp: 60 each, Rfl: 1   glucose blood (FREESTYLE LITE) test strip, Use as directed  to test blood glucose levels 3 times daily., Disp: 300 each, Rfl: 3   hydrOXYzine (ATARAX) 25 MG tablet, Take 1 tablet (25 mg total) by mouth as needed. (Patient taking differently: Take 25 mg by mouth as needed for itching.), Disp: 30 tablet, Rfl: 1   insulin glargine-yfgn (SEMGLEE, YFGN,) 100 UNIT/ML Pen, Inject 6 units under the skin once daily (discard pen after 28 days from first use), Disp: 3 mL, Rfl: 11   insulin lispro (HUMALOG KWIKPEN) 100 UNIT/ML KwikPen, Inject 3 to 4 units under the skin three times daily as needed (Patient taking differently: Inject 2-4 Units into the skin 3 (three) times daily. 150-250 = 2 units  250-350 = 3 units >350 = 4 units), Disp: 9 mL, Rfl: 6   Insulin Pen Needle 32G X 4 MM MISC, USE AS DIRECTED 4 TO 5 TIMES DAILY TO INJECT INSULIN, Disp: 400 each, Rfl: 6   lacosamide (VIMPAT) 200 MG TABS tablet, TAKE 1 TABLET TWICE A DAY EXCEPT ON DIALYSIS DAYS M-W-F GIVE 1 TAB IN AM, 1 TAB AFTER DIALYSIS, 1 TAB IN PM. (Patient taking  differently: Take 200 mg by mouth See admin instructions. Take 200 mg two times a day on Tues, Thurs, Sat, and Sun. Then take 200 mg three times a day on Mon, Wed, and Fri (Dialysis Days)), Disp: 206 tablet, Rfl: 3   lamoTRIgine (LAMICTAL) 200 MG tablet, TAKE 1 TABLET BY MOUTH 2 TIMES DAILY (Patient taking differently: Take 200 mg by mouth 2 (two) times daily.), Disp: 180 tablet, Rfl: 3   Lancets (FREESTYLE) lancets, , Disp: , Rfl:    metoprolol succinate (TOPROL-XL) 25 MG 24 hr tablet, Take 25 mg by mouth 2 (two) times daily. (Patient not taking: Reported on 05/21/2022), Disp: , Rfl:    midodrine (PROAMATINE) 10 MG tablet, Take 1 tablet by mouth 2 times daily., Disp: 180 tablet, Rfl: 1   multivitamin (RENA-VIT) TABS tablet, Take 1 tablet by mouth at bedtime., Disp: , Rfl:    pantoprazole (PROTONIX) 40 MG tablet, TAKE 1 TABLET BY MOUTH ONCE DAILY, Disp: 90 tablet, Rfl: 1   prochlorperazine (COMPAZINE) 10 MG tablet, Take 10 mg by mouth every 6 (six) hours as needed for nausea., Disp: , Rfl:    UNIFINE PENTIPS 31G X 8 MM MISC, USE AS DIRECTED DAILY AFTER SUPPER, Disp: 100 each, Rfl: 3   FREESTYLE LITE test strip, , Disp: , Rfl:    Glucagon, rDNA, (GLUCAGON EMERGENCY) 1 MG KIT, See admin instructions., Disp: , Rfl:    Allergies  Allergen Reactions   Codeine Swelling   Codeine Rash and Other (See Comments)    Unknown reaction (patient says it was more serious than just a rash, but he can't remember what happened)      Review of Systems  Constitutional: Negative.   HENT: Negative.    Eyes: Negative.  Negative for blurred vision.  Respiratory: Negative.    Cardiovascular: Negative.   Endocrine: Negative.  Negative for polydipsia, polyphagia and polyuria.  Genitourinary: Negative.   Musculoskeletal: Negative.   Skin: Negative.   Allergic/Immunologic: Negative.   Neurological: Negative.   Hematological: Negative.   Psychiatric/Behavioral: Negative.       Today's Vitals   05/06/22 1618   BP: 128/80  Pulse: 64  Temp: 98.1 F (36.7 C)  SpO2: 96%  Weight: 187 lb 3.2 oz (84.9 kg)  Height: 5' 9"  (1.753 m)  PainSc: 1    Body mass index is 27.64 kg/m.  Wt Readings from Last  3 Encounters:  05/13/22 172 lb 9.9 oz (78.3 kg)  05/06/22 187 lb 3.2 oz (84.9 kg)  05/01/22 185 lb 9.6 oz (84.2 kg)    Objective:  Physical Exam Vitals and nursing note reviewed.  Constitutional:      Appearance: Normal appearance.     Comments: Examined while seated in wheelchair  HENT:     Head: Normocephalic and atraumatic.     Right Ear: Ear canal and external ear normal. There is impacted cerumen.     Left Ear: Ear canal and external ear normal. There is impacted cerumen.     Nose:     Comments: Masked     Mouth/Throat:     Comments: Masked  Eyes:     Extraocular Movements: Extraocular movements intact.     Conjunctiva/sclera: Conjunctivae normal.     Pupils: Pupils are equal, round, and reactive to light.  Cardiovascular:     Rate and Rhythm: Normal rate and regular rhythm.     Heart sounds: Normal heart sounds.  Pulmonary:     Effort: Pulmonary effort is normal.     Breath sounds: Normal breath sounds.  Chest:  Breasts:    Right: Normal. No swelling, bleeding, inverted nipple, mass or nipple discharge.     Left: Normal. No swelling, bleeding, inverted nipple, mass or nipple discharge.  Abdominal:     General: Abdomen is flat. Bowel sounds are normal.     Palpations: Abdomen is soft.  Genitourinary:    Comments: Deferred  Musculoskeletal:        General: No swelling.     Cervical back: Normal range of motion and neck supple.  Skin:    General: Skin is warm.  Neurological:     Mental Status: He is alert.     Motor: Weakness present.     Comments: Gait not assessed, he is in wheelchair  Psychiatric:        Mood and Affect: Mood normal.        Behavior: Behavior normal.      Assessment And Plan:     1. Encounter for annual health examination Comments: A full exam was  performed. DRE deferred. PATIENT IS ADVISED TO GET 30-45 MINUTES REGULAR EXERCISE NO LESS THAN FOUR TO FIVE DAYS PER WEEK - BOTH WEIGHTBEARING EXERCISES AND AEROBIC ARE RECOMMENDED.  PATIENT IS ADVISED TO FOLLOW A HEALTHY DIET WITH AT LEAST SIX FRUITS/VEGGIES PER DAY, DECREASE INTAKE OF RED MEAT, AND TO INCREASE FISH INTAKE TO TWO DAYS PER WEEK.  MEATS/FISH SHOULD NOT BE FRIED, BAKED OR BROILED IS PREFERABLE.  IT IS ALSO IMPORTANT TO CUT BACK ON YOUR SUGAR INTAKE. PLEASE AVOID ANYTHING WITH ADDED SUGAR, CORN SYRUP OR OTHER SWEETENERS. IF YOU MUST USE A SWEETENER, YOU CAN TRY STEVIA. IT IS ALSO IMPORTANT TO AVOID ARTIFICIALLY SWEETENERS AND DIET BEVERAGES. LASTLY, I SUGGEST WEARING SPF 50 SUNSCREEN ON EXPOSED PARTS AND ESPECIALLY WHEN IN THE DIRECT SUNLIGHT FOR AN EXTENDED PERIOD OF TIME.  PLEASE AVOID FAST FOOD RESTAURANTS AND INCREASE YOUR WATER INTAKE.  2. Diabetes mellitus with end-stage renal disease ([place]) Comments: Chronic, he is followed by Endo. Importance of dietary compliance was d/w patient.  3. Benign hypertensive kidney disease with chronic kidney disease stage V or end stage renal disease ([place]) Comments: Chronic, well controlled. EKG performed, SB w/ old anterior infarct and nonspecific T abnormality. No med changes. Advised to follow low sodium diet.  - EKG 12-Lead  4. ESRD (end stage renal disease) on dialysis [name]) Comments: He  has dialysis on MWF.   5. Paroxysmal atrial fibrillation ([facility]) Comments: Chronic, he is not anticoagulated due to h/o subdural hematoma and previous GI bleeds. Currently in sinus rhythm.   6. Multiple myeloma not having achieved remission ([place]) Comments: Chronic, as per Oncology.   7. Vascular dementia with behavior disturbance ([facility]) Comments: Chronic, stable. Optimal BS/BP control is needed to ensure this does not worsen.   8. Bilateral impacted cerumen Comments: After obtaining verbal consent by wife/pt, both ears were flushed by irrigation. NO TM  abnormalities were noted.  - Ear Lavage  9. Spastic hemiparesis ([facility]) Comments: Chronic, currently in wheelchair.   10. Hyperparathyroidism, secondary renal ([place]) Comments: He has brought in his Renal labs, I will have these abstracted into his chart.   11. Nocturia - PSA   Patient was given opportunity to ask questions. Patient verbalized understanding of the plan and was able to repeat key elements of the plan. All questions were answered to their satisfaction.   I, Isaiah Greenland, MD, have reviewed all documentation for this visit. The documentation on 05/25/22 for the exam, diagnosis, procedures, and orders are all accurate and complete.   IF YOU HAVE BEEN REFERRED TO A SPECIALIST, IT MAY TAKE 1-2 WEEKS TO SCHEDULE/PROCESS THE REFERRAL. IF YOU HAVE NOT HEARD FROM US/SPECIALIST IN TWO WEEKS, PLEASE GIVE [place] A CALL AT [phone].   THE PATIENT IS ENCOURAGED TO PRACTICE SOCIAL DISTANCING DUE TO THE COVID-19 PANDEMIC.

## 2022-05-07 ENCOUNTER — Other Ambulatory Visit (HOSPITAL_COMMUNITY): Payer: Self-pay

## 2022-05-07 ENCOUNTER — Encounter: Payer: Self-pay | Admitting: Internal Medicine

## 2022-05-07 DIAGNOSIS — N2581 Secondary hyperparathyroidism of renal origin: Secondary | ICD-10-CM | POA: Diagnosis not present

## 2022-05-07 DIAGNOSIS — D631 Anemia in chronic kidney disease: Secondary | ICD-10-CM | POA: Diagnosis not present

## 2022-05-07 DIAGNOSIS — Z992 Dependence on renal dialysis: Secondary | ICD-10-CM | POA: Diagnosis not present

## 2022-05-07 DIAGNOSIS — N186 End stage renal disease: Secondary | ICD-10-CM | POA: Diagnosis not present

## 2022-05-07 LAB — PSA: Prostate Specific Ag, Serum: 1.4 ng/mL (ref 0.0–4.0)

## 2022-05-07 MED ORDER — HYDROXYZINE HCL 25 MG PO TABS
ORAL_TABLET | ORAL | 6 refills | Status: DC
  Filled 2022-05-07: qty 90, 30d supply, fill #0

## 2022-05-07 NOTE — Progress Notes (Signed)
This encounter was created in error - please disregard.

## 2022-05-08 ENCOUNTER — Other Ambulatory Visit (HOSPITAL_COMMUNITY): Payer: Self-pay

## 2022-05-09 DIAGNOSIS — N186 End stage renal disease: Secondary | ICD-10-CM | POA: Diagnosis not present

## 2022-05-09 DIAGNOSIS — Z992 Dependence on renal dialysis: Secondary | ICD-10-CM | POA: Diagnosis not present

## 2022-05-09 DIAGNOSIS — N2581 Secondary hyperparathyroidism of renal origin: Secondary | ICD-10-CM | POA: Diagnosis not present

## 2022-05-12 ENCOUNTER — Emergency Department (HOSPITAL_COMMUNITY): Payer: Medicare Other

## 2022-05-12 ENCOUNTER — Encounter (HOSPITAL_COMMUNITY): Payer: Self-pay | Admitting: Nephrology

## 2022-05-12 ENCOUNTER — Other Ambulatory Visit: Payer: Self-pay

## 2022-05-12 DIAGNOSIS — K219 Gastro-esophageal reflux disease without esophagitis: Secondary | ICD-10-CM | POA: Diagnosis present

## 2022-05-12 DIAGNOSIS — Z20822 Contact with and (suspected) exposure to covid-19: Secondary | ICD-10-CM | POA: Diagnosis present

## 2022-05-12 DIAGNOSIS — R112 Nausea with vomiting, unspecified: Secondary | ICD-10-CM

## 2022-05-12 DIAGNOSIS — J449 Chronic obstructive pulmonary disease, unspecified: Secondary | ICD-10-CM | POA: Diagnosis present

## 2022-05-12 DIAGNOSIS — D509 Iron deficiency anemia, unspecified: Secondary | ICD-10-CM | POA: Diagnosis not present

## 2022-05-12 DIAGNOSIS — N2581 Secondary hyperparathyroidism of renal origin: Secondary | ICD-10-CM | POA: Diagnosis present

## 2022-05-12 DIAGNOSIS — I480 Paroxysmal atrial fibrillation: Secondary | ICD-10-CM | POA: Diagnosis present

## 2022-05-12 DIAGNOSIS — Z8249 Family history of ischemic heart disease and other diseases of the circulatory system: Secondary | ICD-10-CM

## 2022-05-12 DIAGNOSIS — R269 Unspecified abnormalities of gait and mobility: Secondary | ICD-10-CM

## 2022-05-12 DIAGNOSIS — D631 Anemia in chronic kidney disease: Secondary | ICD-10-CM | POA: Diagnosis present

## 2022-05-12 DIAGNOSIS — I69351 Hemiplegia and hemiparesis following cerebral infarction affecting right dominant side: Secondary | ICD-10-CM

## 2022-05-12 DIAGNOSIS — Z66 Do not resuscitate: Secondary | ICD-10-CM | POA: Diagnosis not present

## 2022-05-12 DIAGNOSIS — R570 Cardiogenic shock: Secondary | ICD-10-CM | POA: Diagnosis present

## 2022-05-12 DIAGNOSIS — I10 Essential (primary) hypertension: Secondary | ICD-10-CM | POA: Diagnosis present

## 2022-05-12 DIAGNOSIS — G9340 Encephalopathy, unspecified: Secondary | ICD-10-CM | POA: Diagnosis present

## 2022-05-12 DIAGNOSIS — E1165 Type 2 diabetes mellitus with hyperglycemia: Secondary | ICD-10-CM | POA: Diagnosis present

## 2022-05-12 DIAGNOSIS — R1110 Vomiting, unspecified: Secondary | ICD-10-CM

## 2022-05-12 DIAGNOSIS — Z4682 Encounter for fitting and adjustment of non-vascular catheter: Secondary | ICD-10-CM | POA: Diagnosis not present

## 2022-05-12 DIAGNOSIS — R079 Chest pain, unspecified: Secondary | ICD-10-CM | POA: Diagnosis not present

## 2022-05-12 DIAGNOSIS — R001 Bradycardia, unspecified: Secondary | ICD-10-CM | POA: Diagnosis present

## 2022-05-12 DIAGNOSIS — J341 Cyst and mucocele of nose and nasal sinus: Secondary | ICD-10-CM | POA: Diagnosis not present

## 2022-05-12 DIAGNOSIS — Z992 Dependence on renal dialysis: Secondary | ICD-10-CM

## 2022-05-12 DIAGNOSIS — Z807 Family history of other malignant neoplasms of lymphoid, hematopoietic and related tissues: Secondary | ICD-10-CM

## 2022-05-12 DIAGNOSIS — Z833 Family history of diabetes mellitus: Secondary | ICD-10-CM

## 2022-05-12 DIAGNOSIS — E669 Obesity, unspecified: Secondary | ICD-10-CM | POA: Diagnosis present

## 2022-05-12 DIAGNOSIS — R0902 Hypoxemia: Secondary | ICD-10-CM | POA: Diagnosis not present

## 2022-05-12 DIAGNOSIS — J9601 Acute respiratory failure with hypoxia: Secondary | ICD-10-CM | POA: Diagnosis present

## 2022-05-12 DIAGNOSIS — Z86711 Personal history of pulmonary embolism: Secondary | ICD-10-CM

## 2022-05-12 DIAGNOSIS — Z832 Family history of diseases of the blood and blood-forming organs and certain disorders involving the immune mechanism: Secondary | ICD-10-CM

## 2022-05-12 DIAGNOSIS — I2119 ST elevation (STEMI) myocardial infarction involving other coronary artery of inferior wall: Secondary | ICD-10-CM | POA: Diagnosis present

## 2022-05-12 DIAGNOSIS — I462 Cardiac arrest due to underlying cardiac condition: Secondary | ICD-10-CM | POA: Diagnosis present

## 2022-05-12 DIAGNOSIS — Z86718 Personal history of other venous thrombosis and embolism: Secondary | ICD-10-CM

## 2022-05-12 DIAGNOSIS — Z452 Encounter for adjustment and management of vascular access device: Secondary | ICD-10-CM | POA: Diagnosis not present

## 2022-05-12 DIAGNOSIS — Z79899 Other long term (current) drug therapy: Secondary | ICD-10-CM

## 2022-05-12 DIAGNOSIS — Z9911 Dependence on respirator [ventilator] status: Secondary | ICD-10-CM

## 2022-05-12 DIAGNOSIS — I472 Ventricular tachycardia, unspecified: Principal | ICD-10-CM | POA: Diagnosis present

## 2022-05-12 DIAGNOSIS — I69320 Aphasia following cerebral infarction: Secondary | ICD-10-CM

## 2022-05-12 DIAGNOSIS — I469 Cardiac arrest, cause unspecified: Secondary | ICD-10-CM | POA: Diagnosis not present

## 2022-05-12 DIAGNOSIS — Z7951 Long term (current) use of inhaled steroids: Secondary | ICD-10-CM

## 2022-05-12 DIAGNOSIS — F01518 Vascular dementia, unspecified severity, with other behavioral disturbance: Secondary | ICD-10-CM | POA: Diagnosis present

## 2022-05-12 DIAGNOSIS — Z823 Family history of stroke: Secondary | ICD-10-CM

## 2022-05-12 DIAGNOSIS — D5740 Sickle-cell thalassemia without crisis: Secondary | ICD-10-CM | POA: Diagnosis present

## 2022-05-12 DIAGNOSIS — N186 End stage renal disease: Secondary | ICD-10-CM | POA: Diagnosis present

## 2022-05-12 DIAGNOSIS — I120 Hypertensive chronic kidney disease with stage 5 chronic kidney disease or end stage renal disease: Secondary | ICD-10-CM | POA: Diagnosis present

## 2022-05-12 DIAGNOSIS — I639 Cerebral infarction, unspecified: Secondary | ICD-10-CM | POA: Diagnosis not present

## 2022-05-12 DIAGNOSIS — Z9049 Acquired absence of other specified parts of digestive tract: Secondary | ICD-10-CM

## 2022-05-12 DIAGNOSIS — I213 ST elevation (STEMI) myocardial infarction of unspecified site: Secondary | ICD-10-CM | POA: Diagnosis not present

## 2022-05-12 DIAGNOSIS — R0602 Shortness of breath: Secondary | ICD-10-CM | POA: Diagnosis present

## 2022-05-12 DIAGNOSIS — R0789 Other chest pain: Secondary | ICD-10-CM

## 2022-05-12 DIAGNOSIS — F0154 Vascular dementia, unspecified severity, with anxiety: Secondary | ICD-10-CM | POA: Diagnosis present

## 2022-05-12 DIAGNOSIS — E113593 Type 2 diabetes mellitus with proliferative diabetic retinopathy without macular edema, bilateral: Secondary | ICD-10-CM | POA: Diagnosis present

## 2022-05-12 DIAGNOSIS — N250 Renal osteodystrophy: Secondary | ICD-10-CM | POA: Diagnosis not present

## 2022-05-12 DIAGNOSIS — E039 Hypothyroidism, unspecified: Secondary | ICD-10-CM | POA: Diagnosis present

## 2022-05-12 DIAGNOSIS — I517 Cardiomegaly: Secondary | ICD-10-CM | POA: Diagnosis not present

## 2022-05-12 DIAGNOSIS — I160 Hypertensive urgency: Secondary | ICD-10-CM | POA: Diagnosis present

## 2022-05-12 DIAGNOSIS — C9000 Multiple myeloma not having achieved remission: Secondary | ICD-10-CM | POA: Diagnosis present

## 2022-05-12 DIAGNOSIS — I272 Pulmonary hypertension, unspecified: Secondary | ICD-10-CM | POA: Diagnosis present

## 2022-05-12 DIAGNOSIS — I6200 Nontraumatic subdural hemorrhage, unspecified: Secondary | ICD-10-CM | POA: Diagnosis not present

## 2022-05-12 DIAGNOSIS — E872 Acidosis, unspecified: Secondary | ICD-10-CM | POA: Diagnosis present

## 2022-05-12 DIAGNOSIS — R569 Unspecified convulsions: Secondary | ICD-10-CM | POA: Diagnosis not present

## 2022-05-12 DIAGNOSIS — K449 Diaphragmatic hernia without obstruction or gangrene: Secondary | ICD-10-CM | POA: Diagnosis not present

## 2022-05-12 DIAGNOSIS — S299XXA Unspecified injury of thorax, initial encounter: Secondary | ICD-10-CM | POA: Diagnosis not present

## 2022-05-12 DIAGNOSIS — G40909 Epilepsy, unspecified, not intractable, without status epilepticus: Secondary | ICD-10-CM | POA: Diagnosis present

## 2022-05-12 DIAGNOSIS — E8779 Other fluid overload: Secondary | ICD-10-CM | POA: Diagnosis not present

## 2022-05-12 DIAGNOSIS — E1122 Type 2 diabetes mellitus with diabetic chronic kidney disease: Secondary | ICD-10-CM | POA: Diagnosis not present

## 2022-05-12 DIAGNOSIS — Z794 Long term (current) use of insulin: Secondary | ICD-10-CM

## 2022-05-12 DIAGNOSIS — M47814 Spondylosis without myelopathy or radiculopathy, thoracic region: Secondary | ICD-10-CM | POA: Diagnosis not present

## 2022-05-12 LAB — CBC
HCT: 27.4 % — ABNORMAL LOW (ref 39.0–52.0)
Hemoglobin: 10.2 g/dL — ABNORMAL LOW (ref 13.0–17.0)
MCH: 32.6 pg (ref 26.0–34.0)
MCHC: 37.2 g/dL — ABNORMAL HIGH (ref 30.0–36.0)
MCV: 87.5 fL (ref 80.0–100.0)
Platelets: 208 10*3/uL (ref 150–400)
RBC: 3.13 MIL/uL — ABNORMAL LOW (ref 4.22–5.81)
RDW: 14.1 % (ref 11.5–15.5)
WBC: 12.4 10*3/uL — ABNORMAL HIGH (ref 4.0–10.5)
nRBC: 1.6 % — ABNORMAL HIGH (ref 0.0–0.2)

## 2022-05-12 LAB — D-DIMER, QUANTITATIVE: D-Dimer, Quant: 7.89 ug/mL-FEU — ABNORMAL HIGH (ref 0.00–0.50)

## 2022-05-12 LAB — BASIC METABOLIC PANEL
Anion gap: 21 — ABNORMAL HIGH (ref 5–15)
BUN: 73 mg/dL — ABNORMAL HIGH (ref 6–20)
CO2: 26 mmol/L (ref 22–32)
Calcium: 9.6 mg/dL (ref 8.9–10.3)
Chloride: 95 mmol/L — ABNORMAL LOW (ref 98–111)
Creatinine, Ser: 12.33 mg/dL — ABNORMAL HIGH (ref 0.61–1.24)
GFR, Estimated: 4 mL/min — ABNORMAL LOW (ref >60)
Glucose, Bld: 238 mg/dL — ABNORMAL HIGH (ref 70–99)
Potassium: 4.5 mmol/L (ref 3.5–5.1)
Sodium: 142 mmol/L (ref 135–145)

## 2022-05-12 LAB — HEPATITIS B SURFACE ANTIGEN: Hepatitis B Surface Ag: NONREACTIVE

## 2022-05-12 LAB — TROPONIN I (HIGH SENSITIVITY)
Troponin I (High Sensitivity): 36 ng/L — ABNORMAL HIGH (ref <18)
Troponin I (High Sensitivity): 32 ng/L — ABNORMAL HIGH (ref <18)

## 2022-05-12 LAB — HEPATITIS B SURFACE ANTIBODY,QUALITATIVE: Hep B S Ab: REACTIVE — AB

## 2022-05-12 LAB — HEPATITIS C ANTIBODY: HCV Ab: NONREACTIVE

## 2022-05-12 LAB — HEPATITIS B CORE ANTIBODY, TOTAL: Hep B Core Total Ab: NONREACTIVE

## 2022-05-12 MED ORDER — ESCITALOPRAM OXALATE 10 MG PO TABS
10.0000 mg | ORAL_TABLET | Freq: Every day | ORAL | Status: DC
  Administered 2022-05-13: 10 mg via ORAL
  Filled 2022-05-12: qty 1

## 2022-05-12 MED ORDER — ACETAMINOPHEN 325 MG PO TABS: 650.0000 mg | ORAL_TABLET | Freq: Four times a day (QID) | ORAL | Status: DC | PRN

## 2022-05-12 MED ORDER — POLYETHYLENE GLYCOL 3350 17 G PO PACK
17.0000 g | PACK | Freq: Every day | ORAL | Status: DC | PRN
Start: 2022-05-12 — End: 2022-05-14

## 2022-05-12 MED ORDER — FERRIC CITRATE 1 GM 210 MG(FE) PO TABS
210.0000 mg | ORAL_TABLET | Freq: Three times a day (TID) | ORAL | Status: DC
  Administered 2022-05-13: 210 mg via ORAL
  Filled 2022-05-12 (×5): qty 1

## 2022-05-12 MED ORDER — ASPIRIN 81 MG PO CHEW
324.0000 mg | CHEWABLE_TABLET | Freq: Once | ORAL | Status: AC
  Administered 2022-05-12: 324 mg via ORAL
  Filled 2022-05-12: qty 4

## 2022-05-12 MED ORDER — FLUTICASONE FUROATE-VILANTEROL 200-25 MCG/ACT IN AEPB
1.0000 | INHALATION_SPRAY | Freq: Every day | RESPIRATORY_TRACT | Status: DC
Start: 2022-05-12 — End: 2022-05-14
  Filled 2022-05-12: qty 28

## 2022-05-12 MED ORDER — CALCITRIOL 0.5 MCG PO CAPS
1.5000 ug | ORAL_CAPSULE | ORAL | Status: DC
  Filled 2022-05-12: qty 3

## 2022-05-12 MED ORDER — LABETALOL HCL 5 MG/ML IV SOLN
10.0000 mg | INTRAVENOUS | Status: DC | PRN
  Administered 2022-05-13 (×3): 10 mg via INTRAVENOUS
  Filled 2022-05-12 (×3): qty 4

## 2022-05-12 MED ORDER — ATORVASTATIN CALCIUM 10 MG PO TABS
20.0000 mg | ORAL_TABLET | Freq: Every day | ORAL | Status: DC
  Administered 2022-05-13: 20 mg via ORAL
  Filled 2022-05-12: qty 2

## 2022-05-12 MED ORDER — ONDANSETRON HCL 4 MG PO TABS: 4.0000 mg | ORAL_TABLET | Freq: Four times a day (QID) | ORAL | Status: DC | PRN

## 2022-05-12 MED ORDER — FENTANYL CITRATE PF 50 MCG/ML IJ SOSY
75.0000 ug | PREFILLED_SYRINGE | Freq: Once | INTRAMUSCULAR | Status: AC
  Administered 2022-05-12: 75 ug via INTRAVENOUS
  Filled 2022-05-12: qty 2

## 2022-05-12 MED ORDER — DONEPEZIL HCL 5 MG PO TABS
5.0000 mg | ORAL_TABLET | Freq: Every day | ORAL | Status: DC
  Filled 2022-05-12: qty 1

## 2022-05-12 MED ORDER — LORATADINE 10 MG PO TABS
10.0000 mg | ORAL_TABLET | Freq: Every day | ORAL | Status: DC
  Administered 2022-05-13 (×2): 10 mg via ORAL
  Filled 2022-05-12 (×2): qty 1

## 2022-05-12 MED ORDER — FENTANYL CITRATE PF 50 MCG/ML IJ SOSY
100.0000 ug | PREFILLED_SYRINGE | Freq: Once | INTRAMUSCULAR | Status: AC
  Administered 2022-05-12: 100 ug via INTRAVENOUS
  Filled 2022-05-12: qty 2

## 2022-05-12 MED ORDER — CHLORHEXIDINE GLUCONATE CLOTH 2 % EX PADS
6.0000 | MEDICATED_PAD | Freq: Every day | CUTANEOUS | Status: DC
  Administered 2022-05-14: 6 via TOPICAL

## 2022-05-12 MED ORDER — VITAMIN D 25 MCG (1000 UNIT) PO TABS: 1000.0000 [IU] | ORAL_TABLET | ORAL | Status: DC

## 2022-05-12 MED ORDER — CALCITRIOL 0.25 MCG PO CAPS
1.5000 ug | ORAL_CAPSULE | ORAL | Status: DC
  Administered 2022-05-13: 1.5 ug via ORAL
  Filled 2022-05-12: qty 3

## 2022-05-12 MED ORDER — CALCITRIOL 0.25 MCG PO CAPS: 0.2500 ug | ORAL_CAPSULE | Freq: Every day | ORAL | Status: DC

## 2022-05-12 MED ORDER — CLONAZEPAM 0.5 MG PO TABS
0.5000 mg | ORAL_TABLET | ORAL | Status: DC
  Administered 2022-05-13: 0.5 mg via ORAL
  Filled 2022-05-12: qty 1

## 2022-05-12 MED ORDER — IOHEXOL 350 MG/ML SOLN
65.0000 mL | Freq: Once | INTRAVENOUS | Status: AC | PRN
  Administered 2022-05-12: 65 mL via INTRAVENOUS

## 2022-05-12 MED ORDER — INSULIN ASPART 100 UNIT/ML IJ SOLN: 0.0000 [IU] | Freq: Three times a day (TID) | INTRAMUSCULAR | Status: DC

## 2022-05-12 MED ORDER — IPRATROPIUM-ALBUTEROL 0.5-2.5 (3) MG/3ML IN SOLN
3.0000 mL | RESPIRATORY_TRACT | Status: DC | PRN
Start: 2022-05-12 — End: 2022-05-14

## 2022-05-12 MED ORDER — LABETALOL HCL 5 MG/ML IV SOLN
10.0000 mg | Freq: Once | INTRAVENOUS | Status: AC
  Administered 2022-05-12: 10 mg via INTRAVENOUS
  Filled 2022-05-12: qty 4

## 2022-05-12 MED ORDER — RENA-VITE PO TABS
1.0000 | ORAL_TABLET | Freq: Every day | ORAL | Status: DC
  Administered 2022-05-13: 1 via ORAL
  Filled 2022-05-12 (×2): qty 1

## 2022-05-12 MED ORDER — PROCHLORPERAZINE MALEATE 5 MG PO TABS: 10.0000 mg | ORAL_TABLET | Freq: Four times a day (QID) | ORAL | Status: DC | PRN

## 2022-05-12 MED ORDER — ACETAMINOPHEN 650 MG RE SUPP: 650.0000 mg | Freq: Four times a day (QID) | RECTAL | Status: DC | PRN

## 2022-05-12 MED ORDER — FENTANYL CITRATE PF 50 MCG/ML IJ SOSY
25.0000 ug | PREFILLED_SYRINGE | INTRAMUSCULAR | Status: DC | PRN
  Administered 2022-05-13 (×2): 25 ug via INTRAVENOUS
  Filled 2022-05-12 (×2): qty 1

## 2022-05-12 MED ORDER — METOPROLOL SUCCINATE ER 25 MG PO TB24
25.0000 mg | ORAL_TABLET | Freq: Two times a day (BID) | ORAL | Status: DC
Start: 2022-05-12 — End: 2022-05-13
  Administered 2022-05-13 (×2): 25 mg via ORAL
  Filled 2022-05-12 (×2): qty 1

## 2022-05-12 MED ORDER — HYDROXYZINE HCL 25 MG PO TABS: 25.0000 mg | ORAL_TABLET | ORAL | Status: DC | PRN

## 2022-05-12 MED ORDER — HEPARIN SODIUM (PORCINE) 5000 UNIT/ML IJ SOLN
5000.0000 [IU] | Freq: Three times a day (TID) | INTRAMUSCULAR | Status: DC
  Administered 2022-05-13 – 2022-05-14 (×5): 5000 [IU] via SUBCUTANEOUS
  Filled 2022-05-12 (×5): qty 1

## 2022-05-12 MED ORDER — CINACALCET HCL 30 MG PO TABS
60.0000 mg | ORAL_TABLET | ORAL | Status: DC
Start: 2022-05-12 — End: 2022-05-13
  Administered 2022-05-13: 60 mg via ORAL
  Filled 2022-05-12: qty 2

## 2022-05-12 MED ORDER — ONDANSETRON HCL 4 MG/2ML IJ SOLN
4.0000 mg | Freq: Four times a day (QID) | INTRAMUSCULAR | Status: DC | PRN
  Administered 2022-05-13: 4 mg via INTRAVENOUS
  Filled 2022-05-12: qty 2

## 2022-05-12 MED ORDER — PANTOPRAZOLE SODIUM 40 MG PO TBEC
40.0000 mg | DELAYED_RELEASE_TABLET | Freq: Every day | ORAL | Status: DC
  Administered 2022-05-13: 40 mg via ORAL
  Filled 2022-05-12: qty 1

## 2022-05-12 NOTE — ED Provider Triage Note (Signed)
Emergency Medicine Provider Triage Evaluation Note  Isaiah [name] , a 56 y.o. male  was evaluated in triage.  Pt complains of chest pain and shortness of breath.  Described as dull, worse when taking a deep breath.  Patient is on dialysis, last had dialysis on Friday and was due for it this morning.   Review of Systems  Positive: Chest pain, shortness of breath Negative: Fever, chills, abdominal pain, nausea, vomiting  Physical Exam  BP (!) 147/107   Pulse 83   Temp 98.9 F (37.2 C) (Oral)   Resp 18   SpO2 100%  Gen:   Awake, no distress   Resp:  Normal effort, with some difficulty taking a deep breath, lungs clear to auscultation bilaterally MSK:   Moves extremities without difficulty  Other:    Medical Decision Making  Medically screening exam initiated at 11:03 AM.  Appropriate orders placed.  [name] was informed that the remainder of the evaluation will be completed by another provider, this initial triage assessment does not replace that evaluation, and the importance of remaining in the ED until their evaluation is complete.  Chest pain Labs ordered, EKG without STEMI, chest x-ray ordered   Jacqlyn Larsen, PA-C 05/17/2022 1110

## 2022-05-12 NOTE — Consult Note (Signed)
Renal Service Consult Note [facility]  [facility] 05/09/2022 Sol Blazing, MD Requesting Physician: Dr. Cyndia Skeeters  Reason for Consult: ESRD pt w/ chest pain HPI: The patient is a 56 y.o. year-old w/ hx of CVA/ aphasia, COPD, DM2, ESRD on HD, HTN, GIB, gout, h/o mult myeloma, PAF, pHTN and vasc dementia who presents w/ c/o chest pain starting early this am. Both sides of the chest, sharp and pressure like. No cough or fever or leg swelling. No missed HD (except today). In ED CXR showed congestion and possible early IS edema. Pt on 3 L [place], does not take O2 at home. We are asked to see for dialysis.   Pt seen in ED room. Hx as above. Chest pain not as bad right now. Did vomit x 1. Trop 30, 32. Good HD compliance.   ROS - no joint pain, no HA, no blurry vision, no rash, no diarrhea, no nausea/ vomiting   Past Medical History  Past Medical History:  Diagnosis Date   Abnormality of gait 41/32/4401   Acute metabolic encephalopathy 02/72/5366   Allergy 06/27/2019   AMS (altered mental status) 10/05/2019   Anaphylactic shock, unspecified 07/30/2021   Aphasia due to acute cerebrovascular accident 02/28/2016   Aspiration pneumonia 06/09/2019   Bacteremia    Bilateral kidney stones 05/31/2019   Chronic intracranial subdural hematoma 06/09/2019   COPD (chronic obstructive pulmonary disease) 11/11/2016   CVA (cerebral vascular accident)    2017 and 2021; R-MCA, L-MCA, PCA and bilateral cerebellar complicated by DVT/PE   Delusional thoughts, secondary to stroke 2021   Diabetic retinopathy    PDR OU   Dysphagia, oropharyngeal phase 07/05/2019   ESRD (end stage renal disease) on dialysis    Essential hypertension 06/09/2019   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   GERD (gastroesophageal reflux disease) 11/11/2016   GIB (gastrointestinal bleeding)    Recurrent episodes- 09/2014, 09/2015 and 05/2016   Gout    Hammer toe, acquired 03/03/2016   Headache 09/28/2019    Hemiparesis affecting right side as late effect of cerebrovascular accident 02/28/2016   History of DVT (deep vein thrombosis) 01/08/2016   History of echocardiogram 08/2021   mod septal LVH o/w mild conc LVH, EF 55-60, no RWMA, Gr 2 DD, normal RVSF, severe LAE, trivial MR   History of ischemic left MCA stroke 01/08/2016   History of recent blood transfusion 10/27/2014   2 Units PRBC's   Hyperkalemia 07/2011   Hyperparathyroidism, secondary renal 11/11/2016   Hypocalcemia 12/26/2015   Hypotension 11/11/2016   Hypothyroidism 07/15/2019   Iron deficiency anemia, unspecified 01/22/2017   Ischemic colitis    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   Labile blood glucose    Labile blood pressure    Leukocytosis 01/08/2016   Multiple myeloma 05/17/2018   Onychomycosis due to dermatophyte 03/03/2016   Other specified coagulation defects 11/02/2015   PAF (paroxysmal atrial fibrillation)    Partial idiopathic epilepsy with seizures of localized onset, intractable, without status epilepticus 11/02/2017   Patent foramen ovale 11/11/2016   Proteus infection 05/28/2019   Pruritus, unspecified 11/02/2015   Pulmonary hypertension 11/11/2016    By echo 09/2014  RHC on 04/21/19 for further investigation of the nature of his elevated RVSP estimates on non-invasive imaging. His RHC (performed on a Thursday following HD on a Wednesday) demonstrated:  RA 10, RV 67/9, mPA 38, PCWP 19, CO 9.6, CI 4.5, PVR 2.0.   These findings are consistent with mild elevation in intracardiac filling pressures in  the setting of high cardiac output in the setti   Right sided weakness 01/08/2016   Sepsis    Shortness of breath 11/02/2015   Sickle cell-thalassemia disease    a. Sickle cell trait.   Spastic hemiparesis 07/12/2019   Type 2 diabetes mellitus with hyperglycemia, without long-term current use of insulin    Ulcerative colitis 03/15/2020   Vascular dementia with behavioral disturbance ([place]) 11/05/2021    VTE (venous thromboembolism) 11/11/2016   DVT + PE   Past Surgical History  Past Surgical History:  Procedure Laterality Date   AV FISTULA PLACEMENT Right 12/05/2015   Procedure: INSERTION OF ARTERIOVENOUS (AV) GORE-TEX GRAFT ARM;  Surgeon: Elam Dutch, MD;  Location: [place];  Service: Vascular;  Laterality: Right;   BONE MARROW BIOPSY     CATARACT EXTRACTION Right 09/2019   Dr. Herbert Deaner   CATARACT EXTRACTION Left 10/2019   Dr. Herbert Deaner   CHOLECYSTECTOMY     CHOLECYSTECTOMY  [phone]?   COLONOSCOPY WITH PROPOFOL N/A 01/22/2017   Procedure: COLONOSCOPY WITH PROPOFOL;  Surgeon: Wilford Corner, MD;  Location: [facility];  Service: Endoscopy;  Laterality: N/A;   EYE SURGERY Bilateral    Cat Sx - Dr. Herbert Deaner   INSERTION OF DIALYSIS CATHETER Right 10/16/2015   Procedure: INSERTION OF PALINDROME DIALYSIS CATHETER ;  Surgeon: Elam Dutch, MD;  Location: [place];  Service: Vascular;  Laterality: Right;   OTHER SURGICAL HISTORY     Retinal surgery   PARS PLANA VITRECTOMY  02/17/2012   Procedure: PARS PLANA VITRECTOMY WITH 25 GAUGE;  Surgeon: Hayden Pedro, MD;  Location: [place];  Service: Ophthalmology;  Laterality: Right;   PERIPHERAL VASCULAR CATHETERIZATION N/A 03/20/2016   Procedure: A/V Shuntogram/Fistulagram;  Surgeon: Conrad [place], MD;  Location: [place] [facility];  Service: Cardiovascular;  Laterality: N/A;   TEE WITHOUT CARDIOVERSION N/A 12/02/2016   Procedure: TRANSESOPHAGEAL ECHOCARDIOGRAM (TEE);  Surgeon: Larey Dresser, MD;  Location[name] END[facility];  Service: Cardiovascular;  Laterality: N/A;   Family History  Family History  Problem Relation Age of Onset   Hypertension Mother    Diabetes Mother    Hypertension Father    Stroke Father    Sickle cell anemia Brother    Sickle cell anemia Brother    Diabetes type I Brother    Kidney disease Brother    Multiple myeloma Maternal Aunt    Mental illness Maternal Aunt        visual hallucinations of unknown origin   Social  History  reports that he has never smoked. He has never used smokeless tobacco. He reports that he does not currently use alcohol. He reports that he does not use drugs. Allergies  Allergies  Allergen Reactions   Codeine Swelling   Codeine Rash and Other (See Comments)    Unknown reaction (patient says it was more serious than just a rash, but he can't remember what happened)    Home medications Prior to Admission medications   Medication Sig Start Date End Date Taking? Authorizing Provider  acetaminophen (TYLENOL) 325 MG tablet Take 1-2 tablets (325-650 mg total) by mouth every 4 (four) hours as needed for mild pain. 06/20/19   Love, Ivan Anchors, PA-C  albuterol (PROAIR HFA) 108 [phone] Base) MCG/ACT inhaler Inhale two puffs every 4-6 hours if needed for cough or wheeze 01/26/20   [name], MD  Alcohol Swabs (SM ALCOHOL PREP) 70 % PADS  07/19/19   [provider]  atorvastatin (LIPITOR) 20 MG tablet  TAKE 1 TABLET BY MOUTH ONCE A DAY 02/10/22 02/10/23  [name], MD  AURYXIA 1 GM 210 MG(Fe) tablet Take 210 mg by mouth every evening. 11/30/19   [provider]  calcitRIOL (ROCALTROL) 0.25 MCG capsule Take 0.25 mcg by mouth daily.    [provider]  Cetirizine HCl 10 MG CAPS Take 1 capsule by mouth daily.     [provider]  Cholecalciferol (D3-1000 PO) Take 1 tablet by mouth See admin instructions. Take one tablet by mouth on Monday,Wednesday and Fridays    [provider]  clonazePAM [place]bye [place]) 0.5 MG tablet Take 2 tablets in morning on Monday, Wednesday, Friday before dialysis 01/13/22   Cameron Sprang, MD  donepezil (ARICEPT) 5 MG tablet Take 1/2 tablet daily for 2 weeks, then increase to 1 tablet daily and continue 02/04/22   Cameron Sprang, MD  escitalopram (LEXAPRO) 10 MG tablet Take 1 tablet (10 mg total) by mouth daily. 11/04/21   Cameron Sprang, MD  ferric citrate (AURYXIA) 1 GM 210 MG(Fe) tablet Take 1 tablet by mouth 3 times a day with  meals as directed 03/06/21     ferric citrate (AURYXIA) 1 GM 210 MG(Fe) tablet Take 1 tablet (210 mg total) by mouth 3 (three) times daily with meals as directed 02/12/22     fluticasone (FLONASE) 50 MCG/ACT nasal spray Place 1 spray into both nostrils as needed for allergies. 12/15/18   [provider]  fluticasone furoate-vilanterol (BREO ELLIPTA) 200-25 MCG/ACT AEPB Inhale 1 puff by mouth into the lungs at the same time everyday 03/31/22   [name], MD  FREESTYLE LITE test strip  07/21/19   [provider]  Glucagon, rDNA, (GLUCAGON EMERGENCY) 1 MG KIT See admin instructions. 04/13/18   [provider]  glucose blood (FREESTYLE LITE) test strip Use as directed to test blood glucose levels 3 times daily. 08/09/21     hydrOXYzine (ATARAX) 25 MG tablet Take 1 tablet (25 mg total) by mouth as needed. 10/02/21   [name], MD  hydrOXYzine (ATARAX) 25 MG tablet Take 1 tablet by mouth every eight hours as needed for itching 05/07/22     insulin glargine-yfgn (SEMGLEE, YFGN,) 100 UNIT/ML Pen Inject 6 units under the skin once daily (discard pen after 28 days from first use) 01/13/22     insulin glargine-yfgn (SEMGLEE, YFGN,) 100 UNIT/ML Pen Inject 6 units under the skin once daily (discard pen after 28 days from first use) 01/13/22     Insulin Glargine-yfgn (SEMGLEE, YFGN,) 100 UNIT/ML SOPN Inject 6 units subcutaneously once a day 02/26/21     insulin lispro (HUMALOG KWIKPEN) 100 UNIT/ML KwikPen Inject 3 to 4 units under the skin three times daily as needed 01/13/22     Insulin Pen Needle 32G X 4 MM MISC USE AS DIRECTED 4 TO 5 TIMES DAILY TO INJECT INSULIN 08/09/21     lacosamide (VIMPAT) 200 MG TABS tablet TAKE 1 TABLET TWICE A DAY EXCEPT ON DIALYSIS DAYS M-W-F GIVE 1 TAB IN AM, 1 TAB AFTER DIALYSIS, 1 TAB IN PM. 05/01/22 10/28/22  Cameron Sprang, MD  lamoTRIgine (LAMICTAL) 200 MG tablet TAKE 1 TABLET BY MOUTH 2 TIMES DAILY 06/13/21 06/27/22  Cameron Sprang, MD  Lancets (FREESTYLE)  lancets  07/21/19   [provider]  Methoxy PEG-Epoetin Beta (MIRCERA IJ)  05/15/21 05/19/22  [provider]  metoprolol succinate (TOPROL-XL) 25 MG 24 hr tablet Take 25 mg by mouth 2 (two) times daily.  [provider]  midodrine (PROAMATINE) 10 MG tablet Take 1 tablet by mouth 2 times daily. 05/01/22 05/01/23  [name], MD  multivitamin (RENA-VIT) TABS tablet Take 1 tablet by mouth at bedtime.    [provider]  pantoprazole (PROTONIX) 40 MG tablet TAKE 1 TABLET BY MOUTH ONCE DAILY 03/03/22 03/03/23  [name], MD  prochlorperazine (COMPAZINE) 10 MG tablet Take 10 mg by mouth every 6 (six) hours as needed for nausea. 05/17/18   [provider]  UNIFINE PENTIPS 31G X 8 MM MISC USE AS DIRECTED DAILY AFTER SUPPER 12/10/19   [name], MD     Vitals:   05/21/2022 1053 05/26/2022 1200 05/18/2022 1215 05/31/2022 1401  BP: (!) 147/107 (!) 192/95 (!) 181/99 (!) 194/90  Pulse: 83 85 84 84  Resp: _0 Temp: 98.9 F (37.2 C)     TempSrc: Oral     SpO2: 100% 100% 100% 100%   Exam Gen alert, no distress No rash, cyanosis or gangrene Sclera anicteric, throat clear  No jvd or bruits Chest clear bilat to bases, no rales/ wheezing RRR no MRG Abd soft ntnd no mass or ascites +bs GU normal male MS no joint effusions or deformity Ext no LE or UE edema, no wounds or ulcers Neuro is alert, Ox 3 , nf    RFA AVF+bruit   Home meds include - lipitor, klonopin prn, aricept, lexapro, auryxia 1 ac tid, breo ellipta, insulin glargine/ lispro, renavite, midodrine bid, protonix, metoprolol xl 25 bid, prns/ vits/ supps     OP HD: MWF [facility] 4h  400 /1.5  86kg   2/2 bath RFA AVF   Hep none - last HD 9/1, coming off slight under dry wt, 1-2kg ave UF - mircera 50 q4, last 8/23, due 9/06 - calcitriol 1.5 mcg po tiw - cinacalcet 88m po tiw  CXR - prob early IS edema + congestion  Assessment/ Plan: Chest pain - trops neg in ED, per pmd SOB - w/ poss  early IS edema by CXR.  Not grossly overloaded on exam, but is on 3L [place] in ED.  BP's high. Last HD 3d ago on Friday. May have vol ^ and/or ischemia related mild pulm edema. Pt is stable clinically. Plan for dialysis later tonight as long as he remains stable.  ESRD - on HD MWF. HD plan as above.  HTN - cont meds, vol removal w/ HD H/o CVA - w/ aphasia Anemia esrd - Hb 10, no esa needs. Next esa due on 9/06.  MBD ckd - cont po vdra and sensipar. Ca in range, will add on phos. Cont binder.       RKelly Splinter MD 05/30/2022, 3:38 PM Recent Labs  Lab 06/04/2022 1109  HGB 10.2*  CALCIUM 9.6  CREATININE 12.33*  K 4.5

## 2022-05-12 NOTE — ED Provider Notes (Signed)
[facility] EMERGENCY DEPARTMENT Provider Note   CSN: [id number] Arrival date & time: 05/09/2022  1044     History  Chief Complaint  Patient presents with   Chest Pain    Isaiah [name] is a 56 y.o. male.   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  Patient has had prior cholecystectomy cardiac catheterization AV fistula placement.  Patient was due for dialysis this morning but did not go because of the symptoms he was experiencing.  Last dialysis was on Friday  Pt with complaints of  pain in the chest since 1230 am.  The pain is sharp and pressure.  It goes across both sides of his chest.  It gets worse with breathing.  No fevers or coughing.  No leg swelling.  Pt had an episode in 2004 when he had a pe that felt similar since then.  No longer on anticoagulation.  Hx of internal bleeding.    Home Medications Prior to Admission medications   Medication Sig Start Date End Date Taking? Authorizing Provider  acetaminophen (TYLENOL) 325 MG tablet Take 1-2 tablets (325-650 mg total) by mouth every 4 (four) hours as needed for mild pain. 06/20/19   Love, Ivan Anchors, PA-C  albuterol (PROAIR HFA) 108 [phone] Base) MCG/ACT inhaler Inhale two puffs every 4-6 hours if needed for cough or wheeze 01/26/20   [name], MD  Alcohol Swabs (SM ALCOHOL PREP) 70 % PADS  07/19/19   [provider]  atorvastatin (LIPITOR) 20 MG tablet TAKE 1 TABLET BY MOUTH ONCE A DAY 02/10/22 02/10/23  [name], MD  AURYXIA 1 GM 210 MG(Fe) tablet Take 210 mg by mouth every evening. 11/30/19   [provider]  calcitRIOL (ROCALTROL) 0.25 MCG capsule Take 0.25 mcg by mouth daily.    [provider]  Cetirizine HCl 10 MG CAPS Take 1 capsule by mouth daily.     [provider]  Cholecalciferol (D3-1000 PO) Take 1 tablet by mouth See admin instructions. Take one tablet by mouth on Monday,Wednesday and Fridays    [provider]  clonazePAM Bobbye [place]) 0.5 MG tablet Take 2 tablets in morning on Monday, Wednesday, Friday before dialysis 01/13/22   Cameron Sprang, MD  donepezil (ARICEPT) 5 MG tablet Take 1/2 tablet daily for 2 weeks, then increase to 1 tablet daily and continue 02/04/22   Cameron Sprang, MD  escitalopram (LEXAPRO) 10 MG tablet Take 1 tablet (10 mg total) by mouth daily. 11/04/21   Cameron Sprang, MD  ferric citrate (AURYXIA) 1 GM 210 MG(Fe) tablet Take 1 tablet by mouth 3 times a day with meals as directed 03/06/21     ferric citrate (AURYXIA) 1 GM 210 MG(Fe) tablet Take 1 tablet (210 mg total) by mouth 3 (three) times daily with meals as directed 02/12/22     fluticasone (FLONASE) 50 MCG/ACT nasal spray Place 1 spray into both nostrils as needed for allergies. 12/15/18   [provider]  fluticasone furoate-vilanterol (BREO ELLIPTA) 200-25 MCG/ACT AEPB Inhale 1 puff by mouth into the lungs at the same time everyday 03/31/22   [name], MD  FREESTYLE LITE test strip  07/21/19   [provider]  Glucagon, rDNA, (GLUCAGON EMERGENCY) 1 MG KIT See admin instructions. 04/13/18   [provider]  glucose blood (FREESTYLE  LITE) test strip Use as directed to test blood glucose levels 3 times daily. 08/09/21     hydrOXYzine (ATARAX) 25 MG tablet Take 1 tablet (25 mg total) by mouth as needed. 10/02/21   [name], MD  hydrOXYzine (ATARAX) 25 MG tablet Take 1 tablet by mouth every eight hours as needed for itching 05/07/22     insulin glargine-yfgn (SEMGLEE, YFGN,) 100 UNIT/ML Pen Inject 6 units under the skin once daily (discard pen after 28 days from first use) 01/13/22     insulin glargine-yfgn (SEMGLEE, YFGN,) 100 UNIT/ML Pen Inject 6 units under the skin once daily (discard pen after 28 days from first use) 01/13/22      Insulin Glargine-yfgn (SEMGLEE, YFGN,) 100 UNIT/ML SOPN Inject 6 units subcutaneously once a day 02/26/21     insulin lispro (HUMALOG KWIKPEN) 100 UNIT/ML KwikPen Inject 3 to 4 units under the skin three times daily as needed 01/13/22     Insulin Pen Needle 32G X 4 MM MISC USE AS DIRECTED 4 TO 5 TIMES DAILY TO INJECT INSULIN 08/09/21     lacosamide (VIMPAT) 200 MG TABS tablet TAKE 1 TABLET TWICE A DAY EXCEPT ON DIALYSIS DAYS M-W-F GIVE 1 TAB IN AM, 1 TAB AFTER DIALYSIS, 1 TAB IN PM. 05/01/22 10/28/22  Cameron Sprang, MD  lamoTRIgine (LAMICTAL) 200 MG tablet TAKE 1 TABLET BY MOUTH 2 TIMES DAILY 06/13/21 06/27/22  Cameron Sprang, MD  Lancets (FREESTYLE) lancets  07/21/19   [provider]  Methoxy PEG-Epoetin Beta (MIRCERA IJ)  05/15/21 05/19/22  [provider]  metoprolol succinate (TOPROL-XL) 25 MG 24 hr tablet Take 25 mg by mouth 2 (two) times daily.    [provider]  midodrine (PROAMATINE) 10 MG tablet Take 1 tablet by mouth 2 times daily. 05/01/22 05/01/23  [name], MD  multivitamin (RENA-VIT) TABS tablet Take 1 tablet by mouth at bedtime.    [provider]  pantoprazole (PROTONIX) 40 MG tablet TAKE 1 TABLET BY MOUTH ONCE DAILY 03/03/22 03/03/23  [name], MD  prochlorperazine (COMPAZINE) 10 MG tablet Take 10 mg by mouth every 6 (six) hours as needed for nausea. 05/17/18   [provider]  UNIFINE PENTIPS 31G X 8 MM MISC USE AS DIRECTED DAILY AFTER SUPPER 12/10/19   [name], MD      Allergies    Codeine and Codeine    Review of Systems   Review of Systems  Cardiovascular:  Positive for chest pain.    Physical Exam Updated Vital Signs BP (!) 194/90   Pulse 84   Temp 98.9 F (37.2 C) (Oral)   Resp 15   SpO2 100%  Physical Exam Vitals and nursing note reviewed.  Constitutional:      Appearance: He is ill-appearing.  HENT:     Head: Normocephalic and atraumatic.     Right Ear: External ear normal.     Left Ear: External ear  normal.  Eyes:     General: No scleral icterus.       Right eye: No discharge.        Left eye: No discharge.     Conjunctiva/sclera: Conjunctivae normal.  Neck:     Trachea: No tracheal deviation.  Cardiovascular:     Rate and Rhythm: Normal rate and regular rhythm.  Pulmonary:     Effort: Pulmonary effort is normal. No respiratory distress.     Breath sounds: Normal breath sounds. No stridor. No decreased breath sounds, wheezing or rales.  Abdominal:     General: Bowel sounds are normal. There is no distension.     Palpations: Abdomen is soft.     Tenderness: There is no abdominal tenderness. There is no guarding or rebound.  Musculoskeletal:        General: No tenderness or deformity.     Cervical back: Neck supple.     Right lower leg: No tenderness. No edema.     Left lower leg: No tenderness. No edema.  Skin:    General: Skin is warm and dry.     Findings: No rash.  Neurological:     Mental Status: He is alert.     Sensory: No sensory deficit.     Motor: No abnormal muscle tone or seizure activity.     Coordination: Coordination normal.     Comments: Right-sided hemiparesis, right facial droop, speech difficulties, per patient and family at his baseline  Psychiatric:        Mood and Affect: Mood normal.     ED Results / Procedures / Treatments   Labs (all labs ordered are listed, but only abnormal results are displayed) Labs Reviewed  BASIC METABOLIC PANEL - Abnormal; Notable for the following components:      Result Value   Chloride 95 (*)    Glucose, Bld 238 (*)    BUN 73 (*)    Creatinine, Ser 12.33 (*)    GFR, Estimated 4 (*)    Anion gap 21 (*)    All other components within normal limits  CBC - Abnormal; Notable for the following components:   WBC 12.4 (*)    RBC 3.13 (*)    Hemoglobin 10.2 (*)    HCT 27.4 (*)    MCHC 37.2 (*)    nRBC 1.6 (*)    All other components within normal limits  D-DIMER, QUANTITATIVE - Abnormal; Notable for the following  components:   D-Dimer, Quant 7.89 (*)    All other components within normal limits  TROPONIN I (HIGH SENSITIVITY) - Abnormal; Notable for the following components:   Troponin I (High Sensitivity) 36 (*)    All other components within normal limits  TROPONIN I (HIGH SENSITIVITY) - Abnormal; Notable for the following components:   Troponin I (High Sensitivity) 32 (*)    All other components within normal limits    EKG EKG Interpretation  Date/Time:  Monday May 12 2022 10:55:39 EDT Ventricular Rate:  83 PR Interval:  202 QRS Duration: 90 QT Interval:  416 QTC Calculation: 488 R Axis:   54 Text Interpretation: Normal sinus rhythm Possible Anterior infarct , age undetermined Abnormal ECG When compared with ECG of 31-Dec-2020 10:36, No significant change since last tracing Confirmed by Dorie Rank [phone]) on 05/29/2022 11:55:33 AM  Radiology CT Angio Chest PE W and/or Wo Contrast  Result Date: 05/30/2022 CLINICAL DATA:  Pleuritic chest pain and shortness of breath. Elevated D-dimer. High probability for pulmonary embolism. EXAM: CT ANGIOGRAPHY CHEST WITH CONTRAST TECHNIQUE: Multidetector CT imaging of the chest was performed using the standard protocol during bolus administration of intravenous contrast. Multiplanar CT image reconstructions and MIPs were obtained to evaluate the vascular anatomy. RADIATION DOSE REDUCTION: This exam was performed according to the departmental dose-optimization program which includes automated exposure control, adjustment of the mA and/or kV according to patient size and/or use of iterative reconstruction technique. CONTRAST:  6m OMNIPAQUE IOHEXOL 350 MG/ML SOLN COMPARISON:  None Available. FINDINGS: Cardiovascular: Satisfactory opacification of pulmonary arteries noted, and no pulmonary emboli  identified. No evidence of thoracic aortic dissection or aneurysm. Mild cardiomegaly. Aortic and coronary atherosclerotic calcification incidentally noted.  Mediastinum/Nodes: No masses or pathologically enlarged lymph nodes identified. Lungs/Pleura: No pulmonary mass, infiltrate, or effusion. Upper abdomen: Small hiatal hernia noted. Prior cholecystectomy. 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 Musculoskeletal: No suspicious bone lesions identified. Review of the MIP images confirms the above findings. IMPRESSION: No evidence of pulmonary embolism or other acute findings. Small hiatal hernia. Chronic auto-splenectomy, consistent with sickle cell disease. Aortic Atherosclerosis (ICD10-I70.0). Electronically Signed   By: Marlaine Hind M.D.   On: 06/02/2022 15:25   DG Chest 2 View  Result Date: 05/31/2022 CLINICAL DATA:  Chest pain EXAM: CHEST - 2 VIEW COMPARISON:  10/04/2019 FINDINGS: AP and lateral views of the chest show low lung volumes. The cardio pericardial silhouette is enlarged. Diffuse interstitial opacity likely has a chronic component but superimposed interstitial edema is not excluded. No pleural effusion. Visualized bony anatomy unremarkable. IMPRESSION: Low volume film with enlarged cardiopericardial silhouette and probable interstitial edema. Electronically Signed   By: Misty Stanley M.D.   On: 05/26/2022 11:37    Procedures Procedures    Medications Ordered in ED Medications  labetalol (NORMODYNE) injection 10 mg (has no administration in time range)  aspirin chewable tablet 324 mg (has no administration in time range)  fentaNYL (SUBLIMAZE) injection 75 mcg (75 mcg Intravenous Given 06/04/2022 1219)  fentaNYL (SUBLIMAZE) injection 100 mcg (100 mcg Intravenous Given 05/26/2022 1429)  iohexol (OMNIPAQUE) 350 MG/ML injection 65 mL (65 mLs Intravenous Contrast Given 05/15/2022 1507)    ED Course/ Medical Decision Making/ A&P Clinical Course as of 05/16/22 0813  Mon May 12, 2022  1251 Troponin I (High Sensitivity)(!) Initial troponin slightly elevated at 36 [JK]  0947 Basic metabolic  panel(!) Metabolic panel consistent with his chronic kidney disease [JK]  1404 D-dimer significantly elevated.  We will proceed with CT angiogram [JK]  1432 Case discussed with Dr. Jonnie Finner nephrology [JK]  1534 Troponin I (High Sensitivity)(!) Serial troponins stable [JK]  1534 CT angiogram does not show evidence of PE [JK]  1555 Case discussed with hospitalist service regarding admission [JK]    Clinical Course User Index [JK] Dorie Rank, MD                           Medical Decision Making Amount and/or Complexity of Data Reviewed Labs: ordered. Decision-making details documented in ED Course. Radiology: ordered.  Risk OTC drugs. Prescription drug management. Decision regarding hospitalization.   Patient presented to the ED for evaluation of acute chest pain.  Concerned about the possibility of pulmonary embolism, aortic dissection, acute coronary syndrome.  Symptoms more pleuritic in nature.  No evidence of pneumonia or pneumothorax on x-ray.  Patient had significantly elevated D-dimer.  CT angiogram was performed and there is no evidence of PE or aortic dissection.  Patient's serial troponins are stable slightly elevated.  Argues against acute coronary syndrome.  Patient missed dialysis today so I have spoken with Dr. Jonnie Finner.  Patient may benefit from echocardiogram and further evaluation.  I will consult the medical service for admission        Final Clinical Impression(s) / ED Diagnoses Final diagnoses:  Chest pain, unspecified type  Hypertensive urgency    Rx / DC Orders ED Discharge Orders     None         Dorie Rank, MD 05/16/22 [id number]

## 2022-05-12 NOTE — H&P (Signed)
History and Physical    [name] MCN:[id number] DOB: 1966-03-14 DOA: 05/26/2022 DOS: the patient was seen and examined on 05/13/2022 PCP: [name], MD  Patient coming from: Home.  Lives with his wife.  Uses walker at baseline.  Chief Complaint:  Chief Complaint  Patient presents with   Chest Pain   HPI: Isaiah [name] [facility] is a 56 y.o. male with PMH of SDH, CVA with residual right hemiparesis, aphasia and vascular dementia, COPD, IDDM-2, ESRD on HD MWF, anemia of renal disease, HTN, hypotension, paroxysmal A-fib/DVT not on AC, seizure disorder, multiple myeloma in remission and GERD brought to ED by EMS due to chest pain and shortness of breath.  Patient is awake and oriented x4 but with significant expressive aphasia to describe his symptoms.  Patient's wife provided history.  Per wife, patient woke up with shortness of breath  about 12:30 AM this morning.  He also reports anterior chest pain.  He was not able to elaborate or describe the pain.  No provoking factor.  His symptoms are about the same without progression or improvement.  He denies cough.  He had 2 episodes of emesis this morning.  Emesis was nonbloody and nonbilious.  No abdominal pain, diarrhea or constipation.  Rarely makes urine.  No fever, sore throat, runny nose or congestion.  No sick contacts.  No changes to his medication.  He completed his HD on Friday but could not go to HD today due to not feeling well.  Patient lives with husband.  Uses walker at baseline.  Has a lift chair for stair.  Denies smoking cigarette, drinking alcohol recreational drug use.  Prefers to remain full code.  In ED, BP 147/107>> 190/90.  Other vital stable.  Saturating at 100% on 3 L.  No documented desaturation.  WBC 12.4.  Hgb 10.2.  K4.5. Cr 12.3.  BUN 73.  Glucose 238.  Troponin 36 > 32.  EKG NSR without acute ischemic finding.  CXR raises concern for some vascular congestion.  D-dimer elevated to 7.9.  CTA chest negative for PE or  infiltrate.  Patient was given full dose aspirin and IV labetalol.  Nephrology consulted.  Hospitalist service called for admission.   Review of Systems: As mentioned in the history of present illness. All other systems reviewed and are negative. Past Medical History:  Diagnosis Date   Abnormality of gait 62/94/7654   Acute metabolic encephalopathy 65/11/5463   Allergy 06/27/2019   AMS (altered mental status) 10/05/2019   Anaphylactic shock, unspecified 07/30/2021   Aphasia due to acute cerebrovascular accident 02/28/2016   Aspiration pneumonia 06/09/2019   Bacteremia    Bilateral kidney stones 05/31/2019   Chronic intracranial subdural hematoma 06/09/2019   COPD (chronic obstructive pulmonary disease) 11/11/2016   CVA (cerebral vascular accident)    2017 and 2021; R-MCA, L-MCA, PCA and bilateral cerebellar complicated by DVT/PE   Delusional thoughts, secondary to stroke 2021   Diabetic retinopathy    PDR OU   Dysphagia, oropharyngeal phase 07/05/2019   ESRD (end stage renal disease) on dialysis    Essential hypertension 06/09/2019   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   GERD (gastroesophageal reflux disease) 11/11/2016   GIB (gastrointestinal bleeding)    Recurrent episodes- 09/2014, 09/2015 and 05/2016   Gout    Hammer toe, acquired 03/03/2016   Headache 09/28/2019   Hemiparesis affecting right side as late effect of cerebrovascular accident 02/28/2016   History of DVT (deep vein thrombosis) 01/08/2016   History of echocardiogram 08/2021  mod septal LVH o/w mild conc LVH, EF 55-60, no RWMA, Gr 2 DD, normal RVSF, severe LAE, trivial MR   History of ischemic left MCA stroke 01/08/2016   History of recent blood transfusion 10/27/2014   2 Units PRBC's   Hyperkalemia 07/2011   Hyperparathyroidism, secondary renal 11/11/2016   Hypocalcemia 12/26/2015   Hypotension 11/11/2016   Hypothyroidism 07/15/2019   Iron deficiency anemia, unspecified 01/22/2017   Ischemic colitis     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   Labile blood glucose    Labile blood pressure    Leukocytosis 01/08/2016   Multiple myeloma 05/17/2018   Onychomycosis due to dermatophyte 03/03/2016   Other specified coagulation defects 11/02/2015   PAF (paroxysmal atrial fibrillation)    Partial idiopathic epilepsy with seizures of localized onset, intractable, without status epilepticus 11/02/2017   Patent foramen ovale 11/11/2016   Proteus infection 05/28/2019   Pruritus, unspecified 11/02/2015   Pulmonary hypertension 11/11/2016    By echo 09/2014  RHC on 04/21/19 for further investigation of the nature of his elevated RVSP estimates on non-invasive imaging. His RHC (performed on a Thursday following HD on a Wednesday) demonstrated:  RA 10, RV 67/9, mPA 38, PCWP 19, CO 9.6, CI 4.5, PVR 2.0.   These findings are consistent with mild elevation in intracardiac filling pressures in the setting of high cardiac output in the setti   Right sided weakness 01/08/2016   Sepsis    Shortness of breath 11/02/2015   Sickle cell-thalassemia disease    a. Sickle cell trait.   Spastic hemiparesis 07/12/2019   Type 2 diabetes mellitus with hyperglycemia, without long-term current use of insulin    Ulcerative colitis 03/15/2020   Vascular dementia with behavioral disturbance ([place]) 11/05/2021   VTE (venous thromboembolism) 11/11/2016   DVT + PE   Past Surgical History:  Procedure Laterality Date   AV FISTULA PLACEMENT Right 12/05/2015   Procedure: INSERTION OF ARTERIOVENOUS (AV) GORE-TEX GRAFT ARM;  Surgeon: Elam Dutch, MD;  Location: [facility];  Service: Vascular;  Laterality: Right;   BONE MARROW BIOPSY     CATARACT EXTRACTION Right 09/2019   Dr. Herbert Deaner   CATARACT EXTRACTION Left 10/2019   Dr. Herbert Deaner   CHOLECYSTECTOMY     CHOLECYSTECTOMY  [phone]?   COLONOSCOPY WITH PROPOFOL N/A 01/22/2017   Procedure: COLONOSCOPY WITH PROPOFOL;  Surgeon: Wilford Corner, MD;  Location: [facility];   Service: Endoscopy;  Laterality: N/A;   EYE SURGERY Bilateral    Cat Sx - Dr. Herbert Deaner   INSERTION OF DIALYSIS CATHETER Right 10/16/2015   Procedure: INSERTION OF PALINDROME DIALYSIS CATHETER ;  Surgeon: Elam Dutch, MD;  Location: [facility];  Service: Vascular;  Laterality: Right;   OTHER SURGICAL HISTORY     Retinal surgery   PARS PLANA VITRECTOMY  02/17/2012   Procedure: PARS PLANA VITRECTOMY WITH 25 GAUGE;  Surgeon: Hayden Pedro, MD;  Location: [facility];  Service: Ophthalmology;  Laterality: Right;   PERIPHERAL VASCULAR CATHETERIZATION N/A 03/20/2016   Procedure: A/V Shuntogram/Fistulagram;  Surgeon: Conrad [place]ville, MD;  Location: [facility];  Service: Cardiovascular;  Laterality: N/A;   TEE WITHOUT CARDIOVERSION N/A 12/02/2016   Procedure: TRANSESOPHAGEAL ECHOCARDIOGRAM (TEE);  Surgeon: Larey Dresser, MD;  Location: [facility];  Service: Cardiovascular;  Laterality: N/A;   Social History:  reports that he has never smoked. He has never used smokeless tobacco. He reports that he does not currently use alcohol. He reports that he does not use drugs.  Allergies  Allergen Reactions   Codeine Swelling   Codeine Rash and Other (See Comments)    Unknown reaction (patient says it was more serious than just a rash, but he can't remember what happened)     Family History  Problem Relation Age of Onset   Hypertension Mother    Diabetes Mother    Hypertension Father    Stroke Father    Sickle cell anemia Brother    Sickle cell anemia Brother    Diabetes type I Brother    Kidney disease Brother    Multiple myeloma Maternal Aunt    Mental illness Maternal Aunt        visual hallucinations of unknown origin    Prior to Admission medications   Medication Sig Start Date End Date Taking? Authorizing Provider  acetaminophen (TYLENOL) 325 MG tablet Take 1-2 tablets (325-650 mg total) by mouth every 4 (four) hours as needed for mild pain. 06/20/19  Yes Love, Ivan Anchors, PA-C  albuterol  (PROAIR HFA) 108 (90 Base) MCG/ACT inhaler Inhale two puffs every 4-6 hours if needed for cough or wheeze 01/26/20  Yes [name], MD  atorvastatin (LIPITOR) 20 MG tablet TAKE 1 TABLET BY MOUTH ONCE A DAY Patient taking differently: Take 20 mg by mouth daily. 02/10/22 02/10/23 Yes [name], MD  calcitRIOL (ROCALTROL) 0.25 MCG capsule Take 0.25 mcg by mouth daily.   Yes [provider]  Cetirizine HCl 10 MG CAPS Take 1 capsule by mouth daily.    Yes [provider]  Cholecalciferol (D3-1000 PO) Take 1 tablet by mouth every Monday, Wednesday, and Friday with hemodialysis.   Yes [provider]  clonazePAM (KLONOPIN) 0.5 MG tablet Take 2 tablets in morning on Monday, Wednesday, Friday before dialysis 01/13/22  Yes Cameron Sprang, MD  donepezil (ARICEPT) 5 MG tablet Take 1/2 tablet daily for 2 weeks, then increase to 1 tablet daily and continue Patient taking differently: Take 5 mg by mouth daily. 02/04/22  Yes Cameron Sprang, MD  escitalopram (LEXAPRO) 10 MG tablet Take 1 tablet (10 mg total) by mouth daily. 11/04/21  Yes Cameron Sprang, MD  ferric citrate (AURYXIA) 1 GM 210 MG(Fe) tablet Take 1 tablet (210 mg total) by mouth 3 (three) times daily with meals as directed 02/12/22  Yes   fluticasone furoate-vilanterol (BREO ELLIPTA) 200-25 MCG/ACT AEPB Inhale 1 puff by mouth into the lungs at the same time everyday 03/31/22  Yes [name], MD  Glucagon, rDNA, (GLUCAGON EMERGENCY) 1 MG KIT See admin instructions. 04/13/18  Yes [provider]  hydrOXYzine (ATARAX) 25 MG tablet Take 1 tablet (25 mg total) by mouth as needed. Patient taking differently: Take 25 mg by mouth as needed for itching. 10/02/21  Yes [name], MD  insulin glargine-yfgn (SEMGLEE, YFGN,) 100 UNIT/ML Pen Inject 6 units under the skin once daily (discard pen after 28 days from first use) 01/13/22  Yes   insulin lispro (HUMALOG KWIKPEN) 100 UNIT/ML KwikPen Inject 3 to 4 units under the skin  three times daily as needed Patient taking differently: Inject 2-4 Units into the skin 3 (three) times daily. 150-250 = 2 units  250-350 = 3 units >350 = 4 units 01/13/22  Yes   lacosamide (VIMPAT) 200 MG TABS tablet TAKE 1 TABLET TWICE A DAY EXCEPT ON DIALYSIS DAYS M-W-F GIVE 1 TAB IN AM, 1 TAB AFTER DIALYSIS, 1 TAB IN PM. Patient taking differently: Take 200 mg by mouth See admin instructions. Take 200 mg  two times a day on Tues, Thurs, Sat, and Sun. Then take 200 mg three times a day on Mon, Wed, and Fri (Dialysis Days) 05/01/22 10/28/22 Yes Cameron Sprang, MD  lamoTRIgine (LAMICTAL) 200 MG tablet TAKE 1 TABLET BY MOUTH 2 TIMES DAILY Patient taking differently: Take 200 mg by mouth 2 (two) times daily. 06/13/21 06/27/22 Yes Cameron Sprang, MD  Methoxy PEG-Epoetin Beta (MIRCERA IJ)  05/15/21 05/19/22 Yes [provider]  midodrine (PROAMATINE) 10 MG tablet Take 1 tablet by mouth 2 times daily. 05/01/22 05/01/23 Yes [name], MD  multivitamin (RENA-VIT) TABS tablet Take 1 tablet by mouth at bedtime.   Yes [provider]  pantoprazole (PROTONIX) 40 MG tablet TAKE 1 TABLET BY MOUTH ONCE DAILY 03/03/22 03/03/23 Yes [name], MD  prochlorperazine (COMPAZINE) 10 MG tablet Take 10 mg by mouth every 6 (six) hours as needed for nausea. 05/17/18  Yes [provider]  Alcohol Swabs (SM ALCOHOL PREP) 70 % PADS  07/19/19   [provider]  fluticasone (FLONASE) 50 MCG/ACT nasal spray Place 1 spray into both nostrils as needed for allergies. Patient not taking: Reported on 05/24/2022 12/15/18   [provider]  FREESTYLE LITE test strip  07/21/19   [provider]  glucose blood (FREESTYLE LITE) test strip Use as directed to test blood glucose levels 3 times daily. 08/09/21     Insulin Pen Needle 32G X 4 MM MISC USE AS DIRECTED 4 TO 5 TIMES DAILY TO INJECT INSULIN 08/09/21     Lancets (FREESTYLE) lancets  07/21/19   [provider]  metoprolol  succinate (TOPROL-XL) 25 MG 24 hr tablet Take 25 mg by mouth 2 (two) times daily. Patient not taking: Reported on 05/15/2022    [provider]  UNIFINE PENTIPS 31G X 8 MM MISC USE AS DIRECTED DAILY AFTER SUPPER 12/10/19   [name], MD    Physical Exam: Vitals:   05/17/2022 1200 06/03/2022 1215 05/21/2022 1401 05/25/2022 1630  BP: (!) 192/95 (!) 181/99 (!) 194/90   Pulse: 85 84 84   Resp: _0 Temp:      TempSrc:      SpO2: 100% 100% 100%   Weight:    85 kg  Height:    5' 9" (1.753 m)   GENERAL: No apparent distress.  Nontoxic. HEENT: MMM.  Vision and hearing grossly intact.  NECK: Supple.  No apparent JVD.  RESP:  No IWOB.  Fair aeration bilaterally. CVS:  RRR. Heart sounds normal.  ABD/GI/GU: BS+. Abd soft, NTND.  MSK/EXT:  Moves extremities. No apparent deformity. No edema.  SKIN: no apparent skin lesion or wound NEURO: Awake and alert. Oriented x 4.  Expressive aphasia.  Residual right hemiparesis more pronounced in RUE.  PSYCH: Calm. Normal affect.   Data Reviewed: See HPI Assessment and Plan: Principal Problem:   Atypical chest pain Active Problems:   ESRD (end stage renal disease) on dialysis   Iron deficiency anemia   Essential hypertension   Paroxysmal atrial fibrillation ([facility])   COPD (chronic obstructive pulmonary disease)   CVA (cerebral vascular accident)   Gastroesophageal reflux disease   Shortness of breath   Abnormality of gait   Hypothyroidism   Vascular dementia with behavioral disturbance ([facility])   Diabetes mellitus with end-stage renal disease ([facility])   Vomiting    Atypical chest pain/shortness of breath: Unclear etiology.  Has mild leukocytosis but no fever.  CTA chest negative for PE or pneumonia.  EKG NSR without acute ischemic finding.  Mild troponin elevation without delta likely demand ischemia from elevated blood pressure and fluid.  Lung exam reassuring.  Does not appear to be in respiratory distress.  Saturating in upper 90s to 100%  on room air.  Doubt pericarditis.  CXR suggests mild vascular congestion.  -Anticipate improvement with dialysis -Continue home Breo Ellipta -Add DuoNeb as needed  ESRD on HD MWF: Missed his HD today due to not feeling well. -Nephrology ordered HD  Uncontrolled hypertension: SBP as high as 190s.  DBP as high as 107. -Continue home Toprol-XL -Add labetalol as needed -Aunts.  Improvement with dialysis and ultrafiltration  Controlled IDDM-2 with hyperglycemia: A1c 5.6% in 01/2022. -Check hemoglobin A1c -SSI-very sensitive  History of CVA with residual right hemiparesis, expressive aphasia and vascular dementia: Stable. Continue home medications  Chronic COPD: Doubt exacerbation. -Inhalers and nebulizers as above  History of paroxysmal A-fib/VTE: Not on anticoagulation.  Currently in sinus rhythm.  History of seizure -Continue home meds.  Anxiety -Continue home Atarax and Klonopin  Elevated troponin: Likely demand ischemia.  EKG reassuring.  Chest pain atypical. -Doubt need for further evaluation  Iron deficiency anemia/anemia of renal disease: Stable. Recent Labs    06/20/21 0934 09/26/21 0833 01/03/22 0918 04/10/22 1120 05/10/2022 1109  HGB 11.2* 10.1* 11.2* 11.1* 10.2*   Goal of care counseling: Extensive discussion about CODE STATUS and pros and cons of CPR and intubation with patient and patient's wife at bedside. -Prefers to remain full code    Advance Care Planning:   Code Status: Full Code   Consults: Nephrology  Family Communication: Updated patient's wife at bedside  Severity of Illness: The appropriate patient status for this patient is OBSERVATION. Observation status is judged to be reasonable and necessary in order to provide the required intensity of service to ensure the patient's safety. The patient's presenting symptoms, physical exam findings, and initial radiographic and laboratory data in the context of their medical condition is felt to place them  at decreased risk for further clinical deterioration. Furthermore, it is anticipated that the patient will be medically stable for discharge from the hospital within 2 midnights of admission.   Author: Mercy Riding, MD 05/23/2022 5:11 PM  For on call review [email or web address].

## 2022-05-12 NOTE — ED Triage Notes (Signed)
Patient BIB [facility] w/ intermittent chest pain and shortness of breath. Describes it as dull, worsens with taking a deep breath.  BP-168/70 HR-82  SpO2- 92 on 2 L of O2 [place]  CBG 191

## 2022-05-12 NOTE — ED Notes (Signed)
Wife Mykell Rawl [phone] would like an update asap

## 2022-05-13 ENCOUNTER — Inpatient Hospital Stay (HOSPITAL_COMMUNITY): Payer: Medicare Other

## 2022-05-13 ENCOUNTER — Observation Stay (HOSPITAL_COMMUNITY): Payer: Medicare Other

## 2022-05-13 DIAGNOSIS — G9340 Encephalopathy, unspecified: Secondary | ICD-10-CM | POA: Diagnosis present

## 2022-05-13 DIAGNOSIS — Z992 Dependence on renal dialysis: Secondary | ICD-10-CM

## 2022-05-13 DIAGNOSIS — I469 Cardiac arrest, cause unspecified: Secondary | ICD-10-CM | POA: Diagnosis not present

## 2022-05-13 DIAGNOSIS — E1122 Type 2 diabetes mellitus with diabetic chronic kidney disease: Secondary | ICD-10-CM | POA: Diagnosis not present

## 2022-05-13 DIAGNOSIS — N186 End stage renal disease: Secondary | ICD-10-CM

## 2022-05-13 DIAGNOSIS — Z20822 Contact with and (suspected) exposure to covid-19: Secondary | ICD-10-CM | POA: Diagnosis present

## 2022-05-13 DIAGNOSIS — J449 Chronic obstructive pulmonary disease, unspecified: Secondary | ICD-10-CM | POA: Diagnosis present

## 2022-05-13 DIAGNOSIS — Z66 Do not resuscitate: Secondary | ICD-10-CM | POA: Diagnosis not present

## 2022-05-13 DIAGNOSIS — C9000 Multiple myeloma not having achieved remission: Secondary | ICD-10-CM | POA: Diagnosis present

## 2022-05-13 DIAGNOSIS — E8779 Other fluid overload: Secondary | ICD-10-CM | POA: Diagnosis not present

## 2022-05-13 DIAGNOSIS — I6200 Nontraumatic subdural hemorrhage, unspecified: Secondary | ICD-10-CM | POA: Diagnosis not present

## 2022-05-13 DIAGNOSIS — E669 Obesity, unspecified: Secondary | ICD-10-CM | POA: Diagnosis present

## 2022-05-13 DIAGNOSIS — I2119 ST elevation (STEMI) myocardial infarction involving other coronary artery of inferior wall: Secondary | ICD-10-CM | POA: Diagnosis present

## 2022-05-13 DIAGNOSIS — F0154 Vascular dementia, unspecified severity, with anxiety: Secondary | ICD-10-CM | POA: Diagnosis present

## 2022-05-13 DIAGNOSIS — F01518 Vascular dementia, unspecified severity, with other behavioral disturbance: Secondary | ICD-10-CM | POA: Diagnosis present

## 2022-05-13 DIAGNOSIS — I462 Cardiac arrest due to underlying cardiac condition: Secondary | ICD-10-CM | POA: Diagnosis present

## 2022-05-13 DIAGNOSIS — R079 Chest pain, unspecified: Secondary | ICD-10-CM | POA: Diagnosis not present

## 2022-05-13 DIAGNOSIS — E039 Hypothyroidism, unspecified: Secondary | ICD-10-CM | POA: Diagnosis present

## 2022-05-13 DIAGNOSIS — I472 Ventricular tachycardia, unspecified: Secondary | ICD-10-CM | POA: Diagnosis present

## 2022-05-13 DIAGNOSIS — Z4682 Encounter for fitting and adjustment of non-vascular catheter: Secondary | ICD-10-CM | POA: Diagnosis not present

## 2022-05-13 DIAGNOSIS — D631 Anemia in chronic kidney disease: Secondary | ICD-10-CM | POA: Diagnosis present

## 2022-05-13 DIAGNOSIS — I120 Hypertensive chronic kidney disease with stage 5 chronic kidney disease or end stage renal disease: Secondary | ICD-10-CM | POA: Diagnosis present

## 2022-05-13 DIAGNOSIS — Z9911 Dependence on respirator [ventilator] status: Secondary | ICD-10-CM | POA: Diagnosis not present

## 2022-05-13 DIAGNOSIS — G40909 Epilepsy, unspecified, not intractable, without status epilepticus: Secondary | ICD-10-CM | POA: Diagnosis present

## 2022-05-13 DIAGNOSIS — N250 Renal osteodystrophy: Secondary | ICD-10-CM | POA: Diagnosis not present

## 2022-05-13 DIAGNOSIS — S299XXA Unspecified injury of thorax, initial encounter: Secondary | ICD-10-CM | POA: Diagnosis not present

## 2022-05-13 DIAGNOSIS — I213 ST elevation (STEMI) myocardial infarction of unspecified site: Secondary | ICD-10-CM | POA: Diagnosis not present

## 2022-05-13 DIAGNOSIS — I69351 Hemiplegia and hemiparesis following cerebral infarction affecting right dominant side: Secondary | ICD-10-CM | POA: Diagnosis not present

## 2022-05-13 DIAGNOSIS — J9601 Acute respiratory failure with hypoxia: Secondary | ICD-10-CM | POA: Diagnosis present

## 2022-05-13 DIAGNOSIS — R0602 Shortness of breath: Secondary | ICD-10-CM | POA: Diagnosis not present

## 2022-05-13 DIAGNOSIS — Z452 Encounter for adjustment and management of vascular access device: Secondary | ICD-10-CM | POA: Diagnosis not present

## 2022-05-13 DIAGNOSIS — E113593 Type 2 diabetes mellitus with proliferative diabetic retinopathy without macular edema, bilateral: Secondary | ICD-10-CM | POA: Diagnosis present

## 2022-05-13 DIAGNOSIS — R0789 Other chest pain: Secondary | ICD-10-CM

## 2022-05-13 DIAGNOSIS — D5740 Sickle-cell thalassemia without crisis: Secondary | ICD-10-CM | POA: Diagnosis present

## 2022-05-13 DIAGNOSIS — N2581 Secondary hyperparathyroidism of renal origin: Secondary | ICD-10-CM | POA: Diagnosis present

## 2022-05-13 DIAGNOSIS — J341 Cyst and mucocele of nose and nasal sinus: Secondary | ICD-10-CM | POA: Diagnosis not present

## 2022-05-13 DIAGNOSIS — R570 Cardiogenic shock: Secondary | ICD-10-CM | POA: Diagnosis present

## 2022-05-13 DIAGNOSIS — I160 Hypertensive urgency: Secondary | ICD-10-CM

## 2022-05-13 DIAGNOSIS — I272 Pulmonary hypertension, unspecified: Secondary | ICD-10-CM | POA: Diagnosis present

## 2022-05-13 DIAGNOSIS — R569 Unspecified convulsions: Secondary | ICD-10-CM | POA: Diagnosis not present

## 2022-05-13 DIAGNOSIS — M47814 Spondylosis without myelopathy or radiculopathy, thoracic region: Secondary | ICD-10-CM | POA: Diagnosis not present

## 2022-05-13 DIAGNOSIS — I517 Cardiomegaly: Secondary | ICD-10-CM | POA: Diagnosis not present

## 2022-05-13 LAB — CBC WITH DIFFERENTIAL/PLATELET
Abs Immature Granulocytes: 0.34 10*3/uL — ABNORMAL HIGH (ref 0.00–0.07)
Abs Immature Granulocytes: 0.11 10*3/uL — ABNORMAL HIGH (ref 0.00–0.07)
Basophils Absolute: 0.1 10*3/uL (ref 0.0–0.1)
Basophils Absolute: 0 10*3/uL (ref 0.0–0.1)
Basophils Relative: 1 %
Basophils Relative: 0 %
Eosinophils Absolute: 0 10*3/uL (ref 0.0–0.5)
Eosinophils Absolute: 0 10*3/uL (ref 0.0–0.5)
Eosinophils Relative: 0 %
Eosinophils Relative: 0 %
Hemoglobin: 10.1 g/dL — ABNORMAL LOW (ref 13.0–17.0)
Hemoglobin: 9.9 g/dL — ABNORMAL LOW (ref 13.0–17.0)
Immature Granulocytes: 3 %
Immature Granulocytes: 1 %
Lymphocytes Relative: 7 %
Lymphocytes Relative: 3 %
Lymphs Abs: 0.9 10*3/uL (ref 0.7–4.0)
Lymphs Abs: 0.3 10*3/uL — ABNORMAL LOW (ref 0.7–4.0)
Monocytes Absolute: 0.3 10*3/uL (ref 0.1–1.0)
Monocytes Absolute: 0.3 10*3/uL (ref 0.1–1.0)
Monocytes Relative: 2 %
Monocytes Relative: 4 %
Neutro Abs: 11.4 10*3/uL — ABNORMAL HIGH (ref 1.7–7.7)
Neutro Abs: 7.7 10*3/uL (ref 1.7–7.7)
Neutrophils Relative %: 87 %
Neutrophils Relative %: 92 %
Platelets: 151 10*3/uL (ref 150–400)
Platelets: 155 10*3/uL (ref 150–400)
WBC: 13 10*3/uL — ABNORMAL HIGH (ref 4.0–10.5)
WBC: 8.4 10*3/uL (ref 4.0–10.5)

## 2022-05-13 LAB — TSH: TSH: 5.624 u[IU]/mL — ABNORMAL HIGH (ref 0.350–4.500)

## 2022-05-13 LAB — MAGNESIUM
Magnesium: 1.8 mg/dL (ref 1.7–2.4)
Magnesium: 1.8 mg/dL (ref 1.7–2.4)

## 2022-05-13 LAB — C-REACTIVE PROTEIN: CRP: 8.4 mg/dL — ABNORMAL HIGH (ref <1.0)

## 2022-05-13 LAB — I-STAT ARTERIAL BLOOD GAS, ED
Acid-base deficit: 1 mmol/L (ref 0.0–2.0)
Bicarbonate: 24.3 mmol/L (ref 20.0–28.0)
Calcium, Ion: 0.93 mmol/L — ABNORMAL LOW (ref 1.15–1.40)
HCT: 33 % — ABNORMAL LOW (ref 39.0–52.0)
Hemoglobin: 11.2 g/dL — ABNORMAL LOW (ref 13.0–17.0)
O2 Saturation: 100 %
Patient temperature: 97.4
Potassium: 4.7 mmol/L (ref 3.5–5.1)
Sodium: 135 mmol/L (ref 135–145)
TCO2: 26 mmol/L (ref 22–32)
pCO2 arterial: 39.8 mmHg (ref 32–48)
pH, Arterial: 7.392 (ref 7.35–7.45)
pO2, Arterial: 416 mmHg — ABNORMAL HIGH (ref 83–108)

## 2022-05-13 LAB — I-STAT CHEM 8, ED
BUN: 46 mg/dL — ABNORMAL HIGH (ref 6–20)
Calcium, Ion: 0.78 mmol/L — CL (ref 1.15–1.40)
Chloride: 96 mmol/L — ABNORMAL LOW (ref 98–111)
Creatinine, Ser: 6.1 mg/dL — ABNORMAL HIGH (ref 0.61–1.24)
Glucose, Bld: 181 mg/dL — ABNORMAL HIGH (ref 70–99)
HCT: 21 % — ABNORMAL LOW (ref 39.0–52.0)
Hemoglobin: 7.1 g/dL — ABNORMAL LOW (ref 13.0–17.0)
Potassium: 3.9 mmol/L (ref 3.5–5.1)
Sodium: 156 mmol/L — ABNORMAL HIGH (ref 135–145)
TCO2: 40 mmol/L — ABNORMAL HIGH (ref 22–32)

## 2022-05-13 LAB — COMPREHENSIVE METABOLIC PANEL
ALT: 36 U/L (ref 0–44)
AST: 36 U/L (ref 15–41)
Albumin: 4.4 g/dL (ref 3.5–5.0)
Alkaline Phosphatase: 172 U/L — ABNORMAL HIGH (ref 38–126)
Anion gap: 17 — ABNORMAL HIGH (ref 5–15)
BUN: 37 mg/dL — ABNORMAL HIGH (ref 6–20)
CO2: 28 mmol/L (ref 22–32)
Calcium: 9.4 mg/dL (ref 8.9–10.3)
Chloride: 94 mmol/L — ABNORMAL LOW (ref 98–111)
Creatinine, Ser: 7.88 mg/dL — ABNORMAL HIGH (ref 0.61–1.24)
GFR, Estimated: 7 mL/min — ABNORMAL LOW (ref >60)
Glucose, Bld: 144 mg/dL — ABNORMAL HIGH (ref 70–99)
Potassium: 4.2 mmol/L (ref 3.5–5.1)
Sodium: 139 mmol/L (ref 135–145)
Total Bilirubin: 0.9 mg/dL (ref 0.3–1.2)
Total Protein: 7.8 g/dL (ref 6.5–8.1)

## 2022-05-13 LAB — HIV ANTIBODY (ROUTINE TESTING W REFLEX): HIV Screen 4th Generation wRfx: NONREACTIVE

## 2022-05-13 LAB — LACTIC ACID, PLASMA: Lactic Acid, Venous: 2.4 mmol/L (ref 0.5–1.9)

## 2022-05-13 LAB — CBG MONITORING, ED
Glucose-Capillary: 150 mg/dL — ABNORMAL HIGH (ref 70–99)
Glucose-Capillary: 131 mg/dL — ABNORMAL HIGH (ref 70–99)
Glucose-Capillary: 225 mg/dL — ABNORMAL HIGH (ref 70–99)

## 2022-05-13 LAB — SEDIMENTATION RATE: Sed Rate: 36 mm/hr — ABNORMAL HIGH (ref 0–16)

## 2022-05-13 LAB — MRSA NEXT GEN BY PCR, NASAL: MRSA by PCR Next Gen: NOT DETECTED

## 2022-05-13 LAB — GLUCOSE, CAPILLARY
Glucose-Capillary: 235 mg/dL — ABNORMAL HIGH (ref 70–99)
Glucose-Capillary: 183 mg/dL — ABNORMAL HIGH (ref 70–99)

## 2022-05-13 LAB — HEPATITIS B SURFACE ANTIBODY, QUANTITATIVE: Hep B S AB Quant (Post): >1000 m[IU]/mL (ref >9.9)

## 2022-05-13 LAB — ECHOCARDIOGRAM COMPLETE: Height: 69 in

## 2022-05-13 LAB — PHOSPHORUS: Phosphorus: 4 mg/dL (ref 2.5–4.6)

## 2022-05-13 MED ORDER — LACOSAMIDE 200 MG PO TABS: 200.0000 mg | ORAL_TABLET | ORAL | Status: DC

## 2022-05-13 MED ORDER — HYDRALAZINE HCL 25 MG PO TABS
25.0000 mg | ORAL_TABLET | Freq: Three times a day (TID) | ORAL | Status: DC
  Administered 2022-05-13: 25 mg via ORAL
  Filled 2022-05-13: qty 1

## 2022-05-13 MED ORDER — LAMOTRIGINE 100 MG PO TABS
200.0000 mg | ORAL_TABLET | Freq: Two times a day (BID) | ORAL | Status: DC
  Filled 2022-05-13: qty 2

## 2022-05-13 MED ORDER — ATORVASTATIN CALCIUM 10 MG PO TABS
20.0000 mg | ORAL_TABLET | Freq: Every day | ORAL | Status: DC
Start: 2022-05-14 — End: 2022-05-14

## 2022-05-13 MED ORDER — HEPARIN SODIUM (PORCINE) 1000 UNIT/ML DIALYSIS
1000.0000 [IU] | INTRAMUSCULAR | Status: DC | PRN
  Administered 2022-05-13: 2400 [IU] via INTRAVENOUS_CENTRAL
  Filled 2022-05-13: qty 6

## 2022-05-13 MED ORDER — FENTANYL CITRATE PF 50 MCG/ML IJ SOSY: 50.0000 ug | PREFILLED_SYRINGE | Freq: Once | INTRAMUSCULAR | Status: DC

## 2022-05-13 MED ORDER — FENTANYL BOLUS VIA INFUSION: 50.0000 ug | INTRAVENOUS | Status: DC | PRN

## 2022-05-13 MED ORDER — ROCURONIUM BROMIDE 50 MG/5ML IV SOLN
INTRAVENOUS | Status: AC | PRN
  Administered 2022-05-13: 100 mg via INTRAVENOUS

## 2022-05-13 MED ORDER — LORAZEPAM 2 MG/ML IJ SOLN
INTRAMUSCULAR | Status: AC
  Filled 2022-05-13: qty 1

## 2022-05-13 MED ORDER — HEPARIN SODIUM (PORCINE) 1000 UNIT/ML IJ SOLN
INTRAMUSCULAR | Status: AC
  Filled 2022-05-13: qty 3

## 2022-05-13 MED ORDER — LACOSAMIDE 200 MG PO TABS
200.0000 mg | ORAL_TABLET | ORAL | Status: DC
Start: 2022-05-13 — End: 2022-05-14
  Administered 2022-05-13: 200 mg
  Filled 2022-05-13: qty 1

## 2022-05-13 MED ORDER — ETOMIDATE 2 MG/ML IV SOLN
INTRAVENOUS | Status: AC | PRN
  Administered 2022-05-13: 20 mg via INTRAVENOUS

## 2022-05-13 MED ORDER — ORAL CARE MOUTH RINSE
15.0000 mL | OROMUCOSAL | Status: DC
  Administered 2022-05-13 – 2022-05-14 (×7): 15 mL via OROMUCOSAL

## 2022-05-13 MED ORDER — PANTOPRAZOLE 2 MG/ML SUSPENSION
40.0000 mg | Freq: Every day | ORAL | Status: DC
  Administered 2022-05-13: 40 mg
  Filled 2022-05-13: qty 20

## 2022-05-13 MED ORDER — DONEPEZIL HCL 5 MG PO TABS
5.0000 mg | ORAL_TABLET | Freq: Every day | ORAL | Status: DC
Start: 2022-05-14 — End: 2022-05-14
  Filled 2022-05-13: qty 1

## 2022-05-13 MED ORDER — RENA-VITE PO TABS
1.0000 | ORAL_TABLET | Freq: Every day | ORAL | Status: DC
  Administered 2022-05-13: 1
  Filled 2022-05-13: qty 1

## 2022-05-13 MED ORDER — LORATADINE 10 MG PO TABS: 10.0000 mg | ORAL_TABLET | Freq: Every day | ORAL | Status: DC

## 2022-05-13 MED ORDER — SODIUM BICARBONATE 8.4 % IV SOLN
INTRAVENOUS | Status: AC | PRN
  Administered 2022-05-13: 50 meq via INTRAVENOUS
  Administered 2022-05-13: 100 meq via INTRAVENOUS

## 2022-05-13 MED ORDER — LAMOTRIGINE 100 MG PO TABS
200.0000 mg | ORAL_TABLET | Freq: Two times a day (BID) | ORAL | Status: DC
Start: 2022-05-13 — End: 2022-05-14
  Administered 2022-05-13: 200 mg
  Filled 2022-05-13 (×3): qty 2

## 2022-05-13 MED ORDER — HYDRALAZINE HCL 20 MG/ML IJ SOLN
5.0000 mg | Freq: Four times a day (QID) | INTRAMUSCULAR | Status: DC | PRN
  Administered 2022-05-13: 5 mg via INTRAVENOUS
  Filled 2022-05-13: qty 1

## 2022-05-13 MED ORDER — CLONAZEPAM 0.5 MG PO TABS: 0.5000 mg | ORAL_TABLET | ORAL | Status: DC

## 2022-05-13 MED ORDER — EPINEPHRINE HCL 5 MG/250ML IV SOLN IN NS
0.5000 ug/min | INTRAVENOUS | Status: DC
  Administered 2022-05-13: 5 ug/min via INTRAVENOUS
  Filled 2022-05-13 (×2): qty 250

## 2022-05-13 MED ORDER — EPINEPHRINE 1 MG/10ML IJ SOSY
PREFILLED_SYRINGE | INTRAMUSCULAR | Status: AC | PRN
  Administered 2022-05-13 (×3): 1 mg via INTRAVENOUS

## 2022-05-13 MED ORDER — SODIUM CHLORIDE 0.9% FLUSH: 10.0000 mL | INTRAVENOUS | Status: DC | PRN

## 2022-05-13 MED ORDER — ESCITALOPRAM OXALATE 10 MG PO TABS
10.0000 mg | ORAL_TABLET | Freq: Every day | ORAL | Status: DC
Start: 2022-05-14 — End: 2022-05-14

## 2022-05-13 MED ORDER — NOREPINEPHRINE 4 MG/250ML-% IV SOLN
0.0000 ug/min | INTRAVENOUS | Status: DC
  Administered 2022-05-13: 10 ug/min via INTRAVENOUS
  Administered 2022-05-14: 5 ug/min via INTRAVENOUS
  Filled 2022-05-13 (×2): qty 250

## 2022-05-13 MED ORDER — SODIUM CHLORIDE 0.9% FLUSH
10.0000 mL | Freq: Two times a day (BID) | INTRAVENOUS | Status: DC
  Administered 2022-05-13 – 2022-05-14 (×2): 10 mL

## 2022-05-13 MED ORDER — CHLORHEXIDINE GLUCONATE CLOTH 2 % EX PADS
6.0000 | MEDICATED_PAD | Freq: Every day | CUTANEOUS | Status: DC
  Administered 2022-05-14: 6 via TOPICAL

## 2022-05-13 MED ORDER — HYDRALAZINE HCL 25 MG PO TABS
25.0000 mg | ORAL_TABLET | Freq: Three times a day (TID) | ORAL | Status: DC
  Administered 2022-05-13: 25 mg
  Filled 2022-05-13 (×2): qty 1

## 2022-05-13 MED ORDER — ORAL CARE MOUTH RINSE: 15.0000 mL | OROMUCOSAL | Status: DC | PRN

## 2022-05-13 MED ORDER — INSULIN ASPART 100 UNIT/ML IJ SOLN
0.0000 [IU] | INTRAMUSCULAR | Status: DC
  Administered 2022-05-13: 1 [IU] via SUBCUTANEOUS
  Administered 2022-05-13: 2 [IU] via SUBCUTANEOUS
  Administered 2022-05-14 (×2): 1 [IU] via SUBCUTANEOUS

## 2022-05-13 MED ORDER — PHENYLEPHRINE HCL (PRESSORS) 10 MG/ML IV SOLN
INTRAVENOUS | Status: AC | PRN
  Administered 2022-05-13: 120 ug via INTRAVENOUS

## 2022-05-13 MED ORDER — VITAMIN D 25 MCG (1000 UNIT) PO TABS: 1000.0000 [IU] | ORAL_TABLET | ORAL | Status: DC

## 2022-05-13 MED ORDER — FENTANYL 2500MCG IN NS 250ML (10MCG/ML) PREMIX INFUSION
50.0000 ug/h | INTRAVENOUS | Status: DC
  Administered 2022-05-13: 50 ug/h via INTRAVENOUS
  Filled 2022-05-13: qty 250

## 2022-05-13 NOTE — Consult Note (Signed)
NAMETUFF CLABO, MRN:  [id number], DOB:  07/19/1966, LOS: 0 ADMISSION DATE:  05/29/2022, CONSULTATION DATE: 05/13/2022 REFERRING MD: Irine Seal, CHIEF COMPLAINT: Status post cardiac arrest seizures  History of Present Illness:  Mr. Trethewey is a 56 year old patient with a plethora of health issues not limited to but including end-stage renal disease last dialysis 06/04/2022, seizure disorder, history of left ischemic MCA stroke, right-sided weakness, multiple myeloma, intracranial subdural hematoma type 2 diabetes, paroxysmal atrial fibrillation COPD who had been admitted for complaints of chest pain in 095 2023 while being held by physical therapy he suffered a seizure and subsequently had cardiac arrest requiring intubation, approximately 10 minutes of CPR, epinephrine x2 with subsequent drip.  With return of spontaneous circulation .  Pulmonary critical care asked to admit to the intensive care unit.  Pertinent  Medical History   Past Medical History:  Diagnosis Date   Abnormality of gait 14/97/0263   Acute metabolic encephalopathy 78/58/8502   Allergy 06/27/2019   AMS (altered mental status) 10/05/2019   Anaphylactic shock, unspecified 07/30/2021   Aphasia due to acute cerebrovascular accident 02/28/2016   Aspiration pneumonia 06/09/2019   Bacteremia    Bilateral kidney stones 05/31/2019   Chronic intracranial subdural hematoma 06/09/2019   COPD (chronic obstructive pulmonary disease) 11/11/2016   CVA (cerebral vascular accident)    2017 and 2021; R-MCA, L-MCA, PCA and bilateral cerebellar complicated by DVT/PE   Delusional thoughts, secondary to stroke 2021   Diabetic retinopathy    PDR OU   Dysphagia, oropharyngeal phase 07/05/2019   ESRD (end stage renal disease) on dialysis    Essential hypertension 06/09/2019   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   GERD (gastroesophageal reflux disease) 11/11/2016   GIB (gastrointestinal bleeding)    Recurrent episodes- 09/2014, 09/2015  and 05/2016   Gout    Hammer toe, acquired 03/03/2016   Headache 09/28/2019   Hemiparesis affecting right side as late effect of cerebrovascular accident 02/28/2016   History of DVT (deep vein thrombosis) 01/08/2016   History of echocardiogram 08/2021   mod septal LVH o/w mild conc LVH, EF 55-60, no RWMA, Gr 2 DD, normal RVSF, severe LAE, trivial MR   History of ischemic left MCA stroke 01/08/2016   History of recent blood transfusion 10/27/2014   2 Units PRBC's   Hyperkalemia 07/2011   Hyperparathyroidism, secondary renal 11/11/2016   Hypocalcemia 12/26/2015   Hypotension 11/11/2016   Hypothyroidism 07/15/2019   Iron deficiency anemia, unspecified 01/22/2017   Ischemic colitis    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   Labile blood glucose    Labile blood pressure    Leukocytosis 01/08/2016   Multiple myeloma 05/17/2018   Onychomycosis due to dermatophyte 03/03/2016   Other specified coagulation defects 11/02/2015   PAF (paroxysmal atrial fibrillation)    Partial idiopathic epilepsy with seizures of localized onset, intractable, without status epilepticus 11/02/2017   Patent foramen ovale 11/11/2016   Proteus infection 05/28/2019   Pruritus, unspecified 11/02/2015   Pulmonary hypertension 11/11/2016    By echo 09/2014  RHC on 04/21/19 for further investigation of the nature of his elevated RVSP estimates on non-invasive imaging. His RHC (performed on a Thursday following HD on a Wednesday) demonstrated:  RA 10, RV 67/9, mPA 38, PCWP 19, CO 9.6, CI 4.5, PVR 2.0.   These findings are consistent with mild elevation in intracardiac filling pressures in the setting of high cardiac output in the setti   Right sided weakness 01/08/2016   Sepsis  Shortness of breath 11/02/2015   Sickle cell-thalassemia disease    a. Sickle cell trait.   Spastic hemiparesis 07/12/2019   Type 2 diabetes mellitus with hyperglycemia, without long-term current use of insulin    Ulcerative  colitis 03/15/2020   Vascular dementia with behavioral disturbance ([place]) 11/05/2021   VTE (venous thromboembolism) 11/11/2016   DVT + PE     Significant Hospital Events: Including procedures, antibiotic start and stop dates in addition to other pertinent events   05/13/2022 status post cardiac arrest following seizure  Interim History / Subjective:  Currently intubated on epinephrine drip  Objective   Blood pressure 111/77, pulse (!) 104, temperature (!) 97.4 F (36.3 C), temperature source Oral, resp. rate 20, height _0  (1.753 m), weight 85 kg, SpO2 100 %.        Intake/Output Summary (Last 24 hours) at 05/13/2022 1107 Last data filed at 06/07/2022 2230 Gross per 24 hour  Intake --  Output 2000 ml  Net -2000 ml   Filed Weights   05/25/2022 1630  Weight: 85 kg    Examination: General: Obese male who is currently unresponsive on full mechanical ventilatory support HENT: Endotracheal tube is in place pupils are currently nonreactive Lungs: Bilateral breath sounds are heard Cardiovascular: Currently in sinus tach Abdomen: Obese soft nontender Extremities: Right AV fistula patent on right arm Neuro: Does not respond to voice commands  Resolved Hospital Problem list     Assessment & Plan:  Ventilator dependent respiratory failure in the setting of cardiac arrest and seizure with return of spontaneous circulation of pulmonary 10 minutes and required epinephrine drip to maintain blood pressure. Bicarbonate Increased respiratory rate on ventilator to 24 1 amp of bicarb Chest x-ray Stat ABGs  Status post cardiac arrest following seizure with return of spontaneous circulation after approximately 10 minutes. Bicarbonate drip Weaned epinephrine drip for support needed transition to Levophed 2D echo Questionable need for cardiology consult in near future   History refractory hypertension Currently on vasopressors  Seizure disorder Continue antiepileptic  End-stage renal  disease last dialysis 05/13/1999 Check electrolytes Placing hemodialysis catheter for CRRT   History of stroke with residual dementia Continue neurological evaluations May need CT of the head once stabilized May need neurology consult  Diabetes mellitus type 2 CBG (last 3)  Recent Labs    05/13/22 0343 05/13/22 0715 05/13/22 1029  GLUCAP 150* 131* 225*   Sliding-scale insulin protocol     Best Practice (right click and "Reselect all SmartList Selections" daily)   Diet/type: NPO DVT prophylaxis: not indicated GI prophylaxis: PPI Lines: Dialysis Catheter Foley:  Yes, and it is still needed Code Status:  full code Last date of multidisciplinary goals of care discussion [tbd]  Labs   CBC: Recent Labs  Lab 05/16/2022 1109 05/13/22 1048  WBC 12.4*  --   HGB 10.2* 7.1*  HCT 27.4* 21.0*  MCV 87.5  --   PLT 208  --     Basic Metabolic Panel: Recent Labs  Lab 05/20/2022 1109 05/13/22 0330 05/13/22 1048  NA 142 139 156*  K 4.5 4.2 3.9  CL 95* 94* 96*  CO2 26 28  --   GLUCOSE 238* 144* 181*  BUN 73* 37* 46*  CREATININE 12.33* 7.88* 6.10*  CALCIUM 9.6 9.4  --   MG  --  1.8  --    GFR: Estimated Creatinine Clearance: 14.3 mL/min (A) (by C-G formula based on SCr of 6.1 mg/dL (H)). Recent Labs  Lab 05/23/2022 1109  WBC  12.4*    Liver Function Tests: Recent Labs  Lab 05/13/22 0330  AST 36  ALT 36  ALKPHOS 172*  BILITOT 0.9  PROT 7.8  ALBUMIN 4.4   No results for input(s): "LIPASE", "AMYLASE" in the last 168 hours. No results for input(s): "AMMONIA" in the last 168 hours.  ABG    Component Value Date/Time   TCO2 40 (H) 05/13/2022 1048     Coagulation Profile: No results for input(s): "INR", "PROTIME" in the last 168 hours.  Cardiac Enzymes: No results for input(s): "CKTOTAL", "CKMB", "CKMBINDEX", "TROPONINI" in the last 168 hours.  HbA1C: Hemoglobin A1C  Date/Time Value Ref Range Status  02/09/2021 12:00 AM 5.7  Final  08/28/2020 12:00 AM 5.3%   Final   Hgb A1c MFr Bld  Date/Time Value Ref Range Status  01/07/2022 03:24 PM 5.6 4.8 - 5.6 % Final    Comment:             Prediabetes: 5.7 - 6.4          Diabetes: >6.4          Glycemic control for adults with diabetes: <7.0     CBG: Recent Labs  Lab 05/13/22 0343 05/13/22 0715 05/13/22 1029  GLUCAP 150* 131* 225*    Review of Systems:   na  Past Medical History:  He,  has a past medical history of Abnormality of gait (38/17/7116), Acute metabolic encephalopathy (57/90/3833), Allergy (06/27/2019), AMS (altered mental status) (10/05/2019), Anaphylactic shock, unspecified (07/30/2021), Aphasia due to acute cerebrovascular accident (02/28/2016), Aspiration pneumonia (06/09/2019), Bacteremia, Bilateral kidney stones (05/31/2019), Chronic intracranial subdural hematoma (06/09/2019), COPD (chronic obstructive pulmonary disease) (11/11/2016), CVA (cerebral vascular accident), Delusional thoughts, secondary to stroke (2021), Diabetic retinopathy, Dysphagia, oropharyngeal phase (07/05/2019), ESRD (end stage renal disease) on dialysis, Essential hypertension (06/09/2019), Familial hypophosphatemia (12/26/2015), GERD (gastroesophageal reflux disease) (11/11/2016), GIB (gastrointestinal bleeding), Gout, Hammer toe, acquired (03/03/2016), Headache (09/28/2019), Hemiparesis affecting right side as late effect of cerebrovascular accident (02/28/2016), History of DVT (deep vein thrombosis) (01/08/2016), History of echocardiogram (08/2021), History of ischemic left MCA stroke (01/08/2016), History of recent blood transfusion (10/27/2014), Hyperkalemia (07/2011), Hyperparathyroidism, secondary renal (11/11/2016), Hypocalcemia (12/26/2015), Hypotension (11/11/2016), Hypothyroidism (07/15/2019), Iron deficiency anemia, unspecified (01/22/2017), Ischemic colitis, IVC (inferior vena cava obstruction) (11/11/2016), Labile blood glucose, Labile blood pressure, Leukocytosis (01/08/2016), Multiple myeloma  (05/17/2018), Onychomycosis due to dermatophyte (03/03/2016), Other specified coagulation defects (11/02/2015), PAF (paroxysmal atrial fibrillation), Partial idiopathic epilepsy with seizures of localized onset, intractable, without status epilepticus (11/02/2017), Patent foramen ovale (11/11/2016), Proteus infection (05/28/2019), Pruritus, unspecified (11/02/2015), Pulmonary hypertension (11/11/2016), Right sided weakness (01/08/2016), Sepsis, Shortness of breath (11/02/2015), Sickle cell-thalassemia disease, Spastic hemiparesis (07/12/2019), Type 2 diabetes mellitus with hyperglycemia, without long-term current use of insulin, Ulcerative colitis (03/15/2020), Vascular dementia with behavioral disturbance ([place]) (11/05/2021), and VTE (venous thromboembolism) (11/11/2016).   Surgical History:   Past Surgical History:  Procedure Laterality Date   AV FISTULA PLACEMENT Right 12/05/2015   Procedure: INSERTION OF ARTERIOVENOUS (AV) GORE-TEX GRAFT ARM;  Surgeon: Elam Dutch, MD;  Location: [facility];  Service: Vascular;  Laterality: Right;   BONE MARROW BIOPSY     CATARACT EXTRACTION Right 09/2019   Dr. Herbert Deaner   CATARACT EXTRACTION Left 10/2019   Dr. Herbert Deaner   CHOLECYSTECTOMY     CHOLECYSTECTOMY  [phone]?   COLONOSCOPY WITH PROPOFOL N/A 01/22/2017   Procedure: COLONOSCOPY WITH PROPOFOL;  Surgeon: Wilford Corner, MD;  Location: [facility];  Service: Endoscopy;  Laterality: N/A;   EYE SURGERY Bilateral  Cat Sx - Dr. Herbert Deaner   INSERTION OF DIALYSIS CATHETER Right 10/16/2015   Procedure: INSERTION OF PALINDROME DIALYSIS CATHETER ;  Surgeon: Elam Dutch, MD;  Location: [facility];  Service: Vascular;  Laterality: Right;   OTHER SURGICAL HISTORY     Retinal surgery   PARS PLANA VITRECTOMY  02/17/2012   Procedure: PARS PLANA VITRECTOMY WITH 25 GAUGE;  Surgeon: Hayden Pedro, MD;  Location: [facility];  Service: Ophthalmology;  Laterality: Right;   PERIPHERAL VASCULAR CATHETERIZATION N/A 03/20/2016    Procedure: A/V Shuntogram/Fistulagram;  Surgeon: Conrad Lafayette, MD;  Location: [facility];  Service: Cardiovascular;  Laterality: N/A;   TEE WITHOUT CARDIOVERSION N/A 12/02/2016   Procedure: TRANSESOPHAGEAL ECHOCARDIOGRAM (TEE);  Surgeon: Larey Dresser, MD;  Location: [facility];  Service: Cardiovascular;  Laterality: N/A;     Social History:   reports that he has never smoked. He has never used smokeless tobacco. He reports that he does not currently use alcohol. He reports that he does not use drugs.   Family History:  His family history includes Diabetes in his mother; Diabetes type I in his brother; Hypertension in his father and mother; Kidney disease in his brother; Mental illness in his maternal aunt; Multiple myeloma in his maternal aunt; Sickle cell anemia in his brother and brother; Stroke in his father.   Allergies Allergies  Allergen Reactions   Codeine Swelling   Codeine Rash and Other (See Comments)    Unknown reaction (patient says it was more serious than just a rash, but he can't remember what happened)      Home Medications  Prior to Admission medications   Medication Sig Start Date End Date Taking? Authorizing Provider  acetaminophen (TYLENOL) 325 MG tablet Take 1-2 tablets (325-650 mg total) by mouth every 4 (four) hours as needed for mild pain. 06/20/19  Yes Love, Ivan Anchors, PA-C  albuterol (PROAIR HFA) 108 (90 Base) MCG/ACT inhaler Inhale two puffs every 4-6 hours if needed for cough or wheeze 01/26/20  Yes [name], MD  atorvastatin (LIPITOR) 20 MG tablet TAKE 1 TABLET BY MOUTH ONCE A DAY Patient taking differently: Take 20 mg by mouth daily. 02/10/22 02/10/23 Yes [name], MD  calcitRIOL (ROCALTROL) 0.25 MCG capsule Take 0.25 mcg by mouth daily.   Yes [provider]  Cetirizine HCl 10 MG CAPS Take 1 capsule by mouth daily.    Yes [provider]  Cholecalciferol (D3-1000 PO) Take 1 tablet by mouth every Monday, Wednesday, and  Friday with hemodialysis.   Yes [provider]  clonazePAM (KLONOPIN) 0.5 MG tablet Take 2 tablets in morning on Monday, Wednesday, Friday before dialysis 01/13/22  Yes Cameron Sprang, MD  donepezil (ARICEPT) 5 MG tablet Take 1/2 tablet daily for 2 weeks, then increase to 1 tablet daily and continue Patient taking differently: Take 5 mg by mouth daily. 02/04/22  Yes Cameron Sprang, MD  escitalopram (LEXAPRO) 10 MG tablet Take 1 tablet (10 mg total) by mouth daily. 11/04/21  Yes Cameron Sprang, MD  ferric citrate (AURYXIA) 1 GM 210 MG(Fe) tablet Take 1 tablet (210 mg total) by mouth 3 (three) times daily with meals as directed 02/12/22  Yes   fluticasone furoate-vilanterol (BREO ELLIPTA) 200-25 MCG/ACT AEPB Inhale 1 puff by mouth into the lungs at the same time everyday 03/31/22  Yes [name], MD  Glucagon, rDNA, (GLUCAGON EMERGENCY) 1 MG KIT See admin instructions. 04/13/18  Yes [provider]  hydrOXYzine (ATARAX)  25 MG tablet Take 1 tablet (25 mg total) by mouth as needed. Patient taking differently: Take 25 mg by mouth as needed for itching. 10/02/21  Yes [name], MD  insulin glargine-yfgn (SEMGLEE, YFGN,) 100 UNIT/ML Pen Inject 6 units under the skin once daily (discard pen after 28 days from first use) 01/13/22  Yes   insulin lispro (HUMALOG KWIKPEN) 100 UNIT/ML KwikPen Inject 3 to 4 units under the skin three times daily as needed Patient taking differently: Inject 2-4 Units into the skin 3 (three) times daily. 150-250 = 2 units  250-350 = 3 units >350 = 4 units 01/13/22  Yes   lacosamide (VIMPAT) 200 MG TABS tablet TAKE 1 TABLET TWICE A DAY EXCEPT ON DIALYSIS DAYS M-W-F GIVE 1 TAB IN AM, 1 TAB AFTER DIALYSIS, 1 TAB IN PM. Patient taking differently: Take 200 mg by mouth See admin instructions. Take 200 mg two times a day on Tues, Thurs, Sat, and Sun. Then take 200 mg three times a day on Mon, Wed, and Fri (Dialysis Days) 05/01/22 10/28/22 Yes Cameron Sprang, MD   lamoTRIgine (LAMICTAL) 200 MG tablet TAKE 1 TABLET BY MOUTH 2 TIMES DAILY Patient taking differently: Take 200 mg by mouth 2 (two) times daily. 06/13/21 06/27/22 Yes Cameron Sprang, MD  Methoxy PEG-Epoetin Beta (MIRCERA IJ)  05/15/21 05/19/22 Yes [provider]  midodrine (PROAMATINE) 10 MG tablet Take 1 tablet by mouth 2 times daily. 05/01/22 05/01/23 Yes [name], MD  multivitamin (RENA-VIT) TABS tablet Take 1 tablet by mouth at bedtime.   Yes [provider]  pantoprazole (PROTONIX) 40 MG tablet TAKE 1 TABLET BY MOUTH ONCE DAILY 03/03/22 03/03/23 Yes [name], MD  prochlorperazine (COMPAZINE) 10 MG tablet Take 10 mg by mouth every 6 (six) hours as needed for nausea. 05/17/18  Yes [provider]  Alcohol Swabs (SM ALCOHOL PREP) 70 % PADS  07/19/19   [provider]  fluticasone (FLONASE) 50 MCG/ACT nasal spray Place 1 spray into both nostrils as needed for allergies. Patient not taking: Reported on 05/27/2022 12/15/18   [provider]  FREESTYLE LITE test strip  07/21/19   [provider]  glucose blood (FREESTYLE LITE) test strip Use as directed to test blood glucose levels 3 times daily. 08/09/21     Insulin Pen Needle 32G X 4 MM MISC USE AS DIRECTED 4 TO 5 TIMES DAILY TO INJECT INSULIN 08/09/21     Lancets (FREESTYLE) lancets  07/21/19   [provider]  metoprolol succinate (TOPROL-XL) 25 MG 24 hr tablet Take 25 mg by mouth 2 (two) times daily. Patient not taking: Reported on 05/22/2022    [provider]  UNIFINE PENTIPS 31G X 8 MM MISC USE AS DIRECTED DAILY AFTER SUPPER 12/10/19   [name], MD     Critical care time: 47 min     Richardson Landry Kasheena Sambrano ACNP Acute Care Nurse Practitioner [place] Please consult Amion 05/13/2022, 11:46 AM

## 2022-05-13 NOTE — Procedures (Signed)
Central Venous Catheter Insertion Procedure Note  Isaiah [name] [facility]  [id number]  April 02, 1966  Date:05/13/22  Time:1:39 PM   Provider Performing:Valen Gillison   Procedure: Insertion of Non-tunneled Central Venous Catheter(36556)with US guidance (47076)    Indication(s) Hemodialysis  Consent Risks of the procedure as well as the alternatives and risks of each were explained to the patient and/or caregiver.  Consent for the procedure was obtained and is signed in the bedside chart  Anesthesia Topical only with 1% lidocaine   Timeout Verified patient identification, verified procedure, site/side was marked, verified correct patient position, special equipment/implants available, medications/allergies/relevant history reviewed, required imaging and test results available.  Sterile Technique Maximal sterile technique including full sterile barrier drape, hand hygiene, sterile gown, sterile gloves, mask, hair covering, sterile ultrasound probe cover (if used).  Procedure Description Area of catheter insertion was cleaned with chlorhexidine and draped in sterile fashion.   With real-time ultrasound guidance a HD catheter was placed into the right internal jugular vein.  Nonpulsatile blood flow and easy flushing noted in all ports.  The catheter was sutured in place and sterile dressing applied.  Complications/Tolerance None; patient tolerated the procedure well. Chest X-ray is ordered to verify placement for internal jugular or subclavian cannulation.  Chest x-ray is not ordered for femoral cannulation.  EBL Minimal  Specimen(s) None

## 2022-05-13 NOTE — Progress Notes (Signed)
Patient 56 year old gentleman, history of ESRD on HD, prior history of left MCA CVA with residual right-sided weakness, seizure disorder, IDDM 2, hypertension, paroxysmal A-fib/DVT not on anticoagulation, multiple myeloma in remission, GERD who presented to the ED on 05/10/2022 with complaints of shortness of breath and chest pain and was admitted.  Patient seen by nephrology underwent hemodialysis on 05/17/2022.   Was called by ED RN this morning that patient was about to work with physical therapy and subsequently had a seizure, noted to go into cardiac arrest, PEA ROSC achieved after 6 minutes patient subsequently intubated, lost his pulse required 6 minutes to achieve ROSC again and subsequently intubated.   Went down to ED, assessed patient, critical care consulted who assessed patient, patient transferred to the ICU and critical care took over care.  Wife updated on the phone.    No charge.

## 2022-05-13 NOTE — Progress Notes (Signed)
LTM EEG hooked up and running - no initial skin breakdown - push button tested - neuro notified. [facility] monitoring.  

## 2022-05-13 NOTE — Progress Notes (Signed)
2D echo attempted, but patient gone for testing

## 2022-05-13 NOTE — Progress Notes (Addendum)
Occupational Therapy Note  Upon arrival, pt awake and conversational. Able to provide information about home and support. Very conversational. SpO2 100% on 2L and 97% on RA. Before attempting to sit at EOB, pt with active seizure in supine and RN called into room; HR 70s and SpO2 90s. MD and RN present. Seizure stopping, then pt nonresponsive and began to brady down.  MD called code, retrieved crash cart, staff started CPR.  PT/OT present if needed but multiple nurses/respiratory/MD in room handling code.     Will return as pt stable to complete evaluation. Below is home information and PLOF.    05/13/22 1300  OT Visit Information  History of Present Illness 56 yo male presenting to ED on 9/4 with chest pain and SOB. PMH of SDH, CVA with residual right hemiparesis, aphasia and vascular dementia, COPD, IDDM-2, ESRD on HD MWF, anemia of renal disease, HTN, hypotension, paroxysmal A-fib/DVT not on AC, seizure disorder, multiple myeloma in remission and GERD  Home Living  Family/patient expects to be discharged to: Private residence  Living Arrangements Spouse/significant other  Available Help at Discharge Family;Available 24 hours/day (wife works but son stays with him)  Type of [place] Two level;1/2 bath on main level;Bed/bath upstairs  Alternate Level Stairs-Number of Steps flight but has lift  Alternate Level Stairs-Rails Right;Left;Can reach both  Engineer, manufacturing systems Yes  [employer] (2 wheels);Associate Professor (4 wheels);Hand held shower head;Grab bars - tub/shower;Other (comment);Shower seat  Additional Comments Stair lift  Prior Function  Prior Level of Function  Independent/Modified Independent  Mobility Comments Walks with RW; short community distances  ADLs Comments Reports independent with ADLs; wife does IADLs; does not drive; wife does meds  Communication  Communication  Expressive difficulties (residual aphasia - needs increased time)    [place], OTR/L Acute Rehab Office: [phone]

## 2022-05-13 NOTE — ED Notes (Signed)
Patient repositioned in bed. Call light within reach and denies any needs at this time.

## 2022-05-13 NOTE — Progress Notes (Signed)
Patient transported from ED Room 9 to 5H00 with no complications noted.

## 2022-05-13 NOTE — ED Provider Notes (Signed)
10:58 AM I was called to the bedside as the patient had an episode of seizure-like activity.  This patient was awaiting transition to his inpatient bed, and quick chart review noted that he was admitted for chest pain, had nearly complete dialysis session yesterday and does have a history of seizures.  As the patient had cessation of his seizure-like activity, he had PEA arrest, and resuscitation commenced.  Resuscitation, per protocol, after 2 rounds of CPR including epinephrine patient had return of spontaneous circulation.  At this point patient had intubation, performed without complication, first-pass.  Patient had hypotension, received push dose pressors, initiation of norepinephrine, but not long thereafter again lost his pulses, after becoming progressively more bradycardic.  Patient had 1 additional round of CPR, with return of spontaneous circulation, and after additional doses of epinephrine, phenylephrine, initiation of continuous epinephrine, discuss case with our critical care team and the admitting team.  Following return of spontaneous circulation, patient had transient episode of tachycardia, though this slowed and he had a consistent sinus rhythm with a 100, with blood pressure support via epinephrine and norepinephrine.  Please see MAR and nursing notes for full details of the resuscitation.   INTUBATION Performed by: Carmin Muskrat  Required items: required blood products, implants, devices, and special equipment available Patient identity confirmed: provided demographic data and hospital-assigned identification number Time out: Immediately prior to procedure a "time out" was called to verify the correct patient, procedure, equipment, support staff and site/side marked as required.  Indications: airway protection / cardiac arrest  Intubation method: Glidescope Laryngoscopy   Preoxygenation: BVM  Sedatives: 20Etomidate Paralytic: 100 rocuronium  Tube Size: 7.5  cuffed  Post-procedure assessment: chest rise and ETCO2 monitor Breath sounds: equal and absent over the epigastrium Tube secured with: ETT holder Chest x-ray interpreted by radiologist and me.  Chest x-ray findings: endotracheal tube in appropriate position  Patient tolerated the procedure well with no immediate complications.  Cardiopulmonary Resuscitation (CPR) Procedure Note Directed/Performed by: Carmin Muskrat I personally directed ancillary staff and/or performed CPR in an effort to regain return of spontaneous circulation and to maintain cardiac, neuro and systemic perfusion.    CRITICAL CARE Performed by: Carmin Muskrat Total critical care time: 45 minutes Critical care time was exclusive of separately billable procedures and treating other patients. Critical care was necessary to treat or prevent imminent or life-threatening deterioration. Critical care was time spent personally by me on the following activities: development of treatment plan with patient and/or surrogate as well as nursing, discussions with consultants, evaluation of patient's response to treatment, examination of patient, obtaining history from patient or surrogate, ordering and performing treatments and interventions, ordering and review of laboratory studies, ordering and review of radiographic studies, pulse oximetry and re-evaluation of patient's condition.      Carmin Muskrat, MD 06/09/2022 [phone]

## 2022-05-13 NOTE — Procedures (Signed)
Arterial Catheter Insertion Procedure Note  Isaiah [name] [facility]  [id number]  1966-06-13  Date:05/13/22  Time:1:40 PM    Provider Performing: Jacky Kindle    Procedure: Insertion of Arterial Line [id number]) with US guidance ([id number])   Indication(s) Blood pressure monitoring and/or need for frequent ABGs  Consent Risks of the procedure as well as the alternatives and risks of each were explained to the patient and/or caregiver.  Consent for the procedure was obtained and is signed in the bedside chart  Anesthesia None   Time Out Verified patient identification, verified procedure, site/side was marked, verified correct patient position, special equipment/implants available, medications/allergies/relevant history reviewed, required imaging and test results available.   Sterile Technique Maximal sterile technique including full sterile barrier drape, hand hygiene, sterile gown, sterile gloves, mask, hair covering, sterile ultrasound probe cover (if used).   Procedure Description Area of catheter insertion was cleaned with chlorhexidine and draped in sterile fashion. With real-time ultrasound guidance an arterial catheter was placed into the left  Axillary  artery.  Appropriate arterial tracings confirmed on monitor.     Complications/Tolerance None; patient tolerated the procedure well.   EBL Minimal   Specimen(s) None

## 2022-05-13 NOTE — Evaluation (Addendum)
Physical Therapy NOTE Patient Details Name: Isaiah [name] [name] MRN: [id number] DOB: May 09, 1966 Today's Date: 05/13/2022  History of Present Illness  56 yo male presenting to ED on 9/4 with chest pain and SOB. PMH of SDH, CVA with residual right hemiparesis, aphasia and vascular dementia, COPD, IDDM-2, ESRD on HD MWF, anemia of renal disease, HTN, hypotension, paroxysmal A-fib/DVT not on AC, seizure disorder, multiple myeloma in remission and GERD  Clinical Impression   NOTE only, not Eval.    Went to evaluate pt with OT.  Pt alert and oriented.  Obtained history from pt.  Pt's O2 sats 97% on 2 L.  Removed O2 sats remained stable for about 1 minute.  Cued pt to sit on EOB , but before he started transfer pt went into seizure.  Called for nursing.  RN and MD in room.  Seizure stopped and HR still 70's but then began to brady down.  MD called code, retrieved crash cart, staff started CPR.  PT/OT present if needed but multiple nurses/respiratory/MD in room handling code.         Home Living Family/patient expects to be discharged to:: Private residence Living Arrangements: Spouse/significant other Available Help at Discharge: Family;Available 24 hours/day (wife works but son stays with him) Type of Home: House       Alternate Level Stairs-Number of Steps: flight but has lift Home Layout: Two level;1/2 bath on main level;Bed/bath upstairs Home Equipment: Conservation officer, nature (2 wheels);Associate Professor (4 wheels);Hand held shower head;Grab bars - tub/shower;Other (comment);Shower seat Additional Comments: Stair lift    Prior Function Prior Level of Function : Independent/Modified Independent             Mobility Comments: Walks with RW; short community distances ADLs Comments: Reports independent with ADLs; wife does IADLs; does not drive; wife does meds           Communication   Communication: Expressive difficulties (residual aphasia - needs increased time)                                                                                  Time: 1013-1028 PT Time Calculation (min) (ACUTE ONLY): 15 min   Charges:  No charge, hx only            [place], PT Acute Rehab [employer] [phone]   Karlton Lemon 05/13/2022, 12:10 PM

## 2022-05-13 NOTE — ED Notes (Signed)
Wife [facility]) updated via phone.

## 2022-05-13 NOTE — ED Notes (Addendum)
Contacted Dr.Rathore concerning BP remaining high between available PRN doses of labetalol. New orders for PRN hydralazine added.

## 2022-05-13 NOTE — Progress Notes (Signed)
RT assisted with a vent patient transport to CT and returned to 3K92 without complications.

## 2022-05-13 NOTE — Progress Notes (Signed)
[facility] Progress Note  Subjective: pt had cardiac arrest this am while getting ready for PT. Moved to ICU, was on levo gtt briefly off now. BP's wnl.   Vitals:   05/13/22 1505 05/13/22 1600 05/13/22 1610 05/13/22 1615  BP: 129/76     Pulse: 82   84  Resp: (!) 28 (!) 28  (!) 28  Temp:   97.8 F (36.6 C)   TempSrc:   Axillary   SpO2: 100%   100%  Weight:      Height:        Exam: Gen on vent, sedated No rash, cyanosis or gangrene Sclera anicteric, throat w/ ETT No jvd or bruits Chest clear anterior/ lateral RRR no MRG Abd soft ntnd no mass or ascites +bs GU normal MS no joint effusions or deformity Ext no LE edema, no wounds or ulcers Neuro is on vent, sedated    RFA AVF+bruit    Home meds include - lipitor, klonopin prn, aricept, lexapro, auryxia 1 ac tid, breo ellipta, insulin glargine/ lispro, renavite, midodrine bid, protonix, metoprolol xl 25 bid, prns/ vits/ supps    OP HD: MWF [facility] 4h  400 /1.5  86kg   2/2 bath RFA AVF   Hep none - last HD 9/1, coming off slight under dry wt, 1-2kg ave UF - mircera 50 q4, last 8/23, due 9/06 - calcitriol 1.5 mcg po tiw - cinacalcet 32m po tiw   CXR 9/4 - prob early IS edema + congestion CXR 9/5 - no active disease   Assessment/ Plan: Cardiac arrest - this morning, in ICU on vent now. Per CCM.  SOB - w/ mild edema by initial CXR.  BP's high and had HD last night w/ 2L UF, cut short due to chest pain. F/u CXR today is clear.  ESRD - on HD MWF. HD tomorrow. Possibly will need CRRT if BP's drop.  HTN - BP's normal post arrest, getting hydralazine 25 tid per NG.  H/o CVA - w/ aphasia Anemia esrd - Hb 10s, no esa needs. Next esa due on 9/06.  MBD ckd - cont po vdra and sensipar. Ca in range, will add on phos. Cont binder.   Rob Altair Stanko 05/13/2022, 4:35 PM  Recent Labs  Lab 05/13/22 0330 05/13/22 1038 05/13/22 1048 05/13/22 1136 05/13/22 1216  HGB  --   --  7.1* 11.2* 10.1*  ALBUMIN 4.4 4.5  --   --   --    CALCIUM 9.4 9.4  --   --   --   CREATININE 7.88* 9.09* 6.10*  --   --   K 4.2 6.1* 3.9 4.7  --    No results for input(s): "IRON", "TIBC", "FERRITIN" in the last 168 hours. Inpatient medications:  [START ON 9Sep 21, 2023 atorvastatin  20 mg Per Tube Daily   Chlorhexidine Gluconate Cloth  6 each Topical Q0600   [START ON 92023/09/21 cholecalciferol  1,000 Units Per Tube Q M,W,F-HD   [START ON 909-21-2023 donepezil  5 mg Per Tube Daily   [START ON 909/21/2023 escitalopram  10 mg Per Tube Daily   fluticasone furoate-vilanterol  1 puff Inhalation Daily   heparin  5,000 Units Subcutaneous Q8H   heparin sodium (porcine)       hydrALAZINE  25 mg Per Tube Q8H   insulin aspart  0-6 Units Subcutaneous Q4H   lacosamide  200 mg Per Tube Irregular times every day   lamoTRIgine  200 mg Per Tube BID   [START ON 9September 21, 2023  loratadine  10 mg Per Tube Daily   LORazepam       multivitamin  1 tablet Per Tube QHS   pantoprazole  40 mg Per Tube Daily   pantoprazole  40 mg Oral Daily    epinephrine Stopped (05/13/22 1259)   norepinephrine (LEVOPHED) Adult infusion Stopped (05/13/22 1346)   acetaminophen **OR** acetaminophen, fentaNYL (SUBLIMAZE) injection, heparin, heparin sodium (porcine), hydrALAZINE, hydrOXYzine, ipratropium-albuterol, labetalol, LORazepam, ondansetron **OR** ondansetron (ZOFRAN) IV, polyethylene glycol

## 2022-05-13 NOTE — Progress Notes (Signed)
[facility] Progress Note Patient Name: Isaiah [name] [facility] DOB: 08-19-1966 MRN: [id number]   Date of Service  05/13/2022  HPI/Events of Note  Patient is s/p seizure followed by PEA cardiac arrest requiring CPR / ACLS protocol, he is s/p ROSC, lactic acid  # 1 post-arrest is 2.4.  eICU Interventions  Trend lactic acid.        Kerry Kass Colvin Blatt 05/13/2022, 9:26 PM

## 2022-05-13 NOTE — Code Documentation (Signed)
Called to bedside from PT, pt having seizure. EDP notified and at bedside. Pt stopped seizing, HR brady down, Pulse check by Dr. Vanita Panda, No Pulse found. CPR started.

## 2022-05-13 NOTE — Progress Notes (Signed)
Lactic Acid 2.4 CCM notified

## 2022-05-13 NOTE — ED Notes (Signed)
Patients [name] called and was given an update.

## 2022-05-14 ENCOUNTER — Other Ambulatory Visit (HOSPITAL_COMMUNITY): Payer: Self-pay

## 2022-05-14 ENCOUNTER — Inpatient Hospital Stay (HOSPITAL_COMMUNITY): Payer: Medicare Other

## 2022-05-14 DIAGNOSIS — I213 ST elevation (STEMI) myocardial infarction of unspecified site: Secondary | ICD-10-CM | POA: Diagnosis not present

## 2022-05-14 DIAGNOSIS — I469 Cardiac arrest, cause unspecified: Secondary | ICD-10-CM

## 2022-05-14 DIAGNOSIS — Z9911 Dependence on respirator [ventilator] status: Secondary | ICD-10-CM | POA: Diagnosis not present

## 2022-05-14 DIAGNOSIS — R569 Unspecified convulsions: Secondary | ICD-10-CM | POA: Diagnosis not present

## 2022-05-14 LAB — CBC
Hemoglobin: 9 g/dL — ABNORMAL LOW (ref 13.0–17.0)
Platelets: 116 10*3/uL — ABNORMAL LOW (ref 150–400)
WBC: 7.3 10*3/uL (ref 4.0–10.5)

## 2022-05-14 LAB — COMPREHENSIVE METABOLIC PANEL
ALT: 85 U/L — ABNORMAL HIGH (ref 0–44)
ALT: 154 U/L — ABNORMAL HIGH (ref 0–44)
ALT: 128 U/L — ABNORMAL HIGH (ref 0–44)
AST: 107 U/L — ABNORMAL HIGH (ref 15–41)
AST: 228 U/L — ABNORMAL HIGH (ref 15–41)
AST: 142 U/L — ABNORMAL HIGH (ref 15–41)
Albumin: 4.5 g/dL (ref 3.5–5.0)
Albumin: 3.7 g/dL (ref 3.5–5.0)
Albumin: 3.4 g/dL — ABNORMAL LOW (ref 3.5–5.0)
Alkaline Phosphatase: 344 U/L — ABNORMAL HIGH (ref 38–126)
Alkaline Phosphatase: 416 U/L — ABNORMAL HIGH (ref 38–126)
Alkaline Phosphatase: 531 U/L — ABNORMAL HIGH (ref 38–126)
Anion gap: 29 — ABNORMAL HIGH (ref 5–15)
Anion gap: 21 — ABNORMAL HIGH (ref 5–15)
Anion gap: 26 — ABNORMAL HIGH (ref 5–15)
BUN: 47 mg/dL — ABNORMAL HIGH (ref 6–20)
BUN: 63 mg/dL — ABNORMAL HIGH (ref 6–20)
BUN: 76 mg/dL — ABNORMAL HIGH (ref 6–20)
CO2: 16 mmol/L — ABNORMAL LOW (ref 22–32)
CO2: 26 mmol/L (ref 22–32)
CO2: 20 mmol/L — ABNORMAL LOW (ref 22–32)
Calcium: 9.4 mg/dL (ref 8.9–10.3)
Calcium: 8.6 mg/dL — ABNORMAL LOW (ref 8.9–10.3)
Calcium: 9 mg/dL (ref 8.9–10.3)
Chloride: 97 mmol/L — ABNORMAL LOW (ref 98–111)
Chloride: 93 mmol/L — ABNORMAL LOW (ref 98–111)
Chloride: 95 mmol/L — ABNORMAL LOW (ref 98–111)
Creatinine, Ser: 9.09 mg/dL — ABNORMAL HIGH (ref 0.61–1.24)
Creatinine, Ser: 9.72 mg/dL — ABNORMAL HIGH (ref 0.61–1.24)
Creatinine, Ser: 10.87 mg/dL — ABNORMAL HIGH (ref 0.61–1.24)
GFR, Estimated: 6 mL/min — ABNORMAL LOW (ref >60)
GFR, Estimated: 6 mL/min — ABNORMAL LOW (ref >60)
GFR, Estimated: 5 mL/min — ABNORMAL LOW (ref >60)
Glucose, Bld: 188 mg/dL — ABNORMAL HIGH (ref 70–99)
Glucose, Bld: 212 mg/dL — ABNORMAL HIGH (ref 70–99)
Glucose, Bld: 271 mg/dL — ABNORMAL HIGH (ref 70–99)
Potassium: 6.1 mmol/L — ABNORMAL HIGH (ref 3.5–5.1)
Potassium: 3.8 mmol/L (ref 3.5–5.1)
Potassium: 4.4 mmol/L (ref 3.5–5.1)
Sodium: 142 mmol/L (ref 135–145)
Sodium: 140 mmol/L (ref 135–145)
Sodium: 141 mmol/L (ref 135–145)
Total Bilirubin: 1.2 mg/dL (ref 0.3–1.2)
Total Bilirubin: 2.1 mg/dL — ABNORMAL HIGH (ref 0.3–1.2)
Total Bilirubin: 1.9 mg/dL — ABNORMAL HIGH (ref 0.3–1.2)
Total Protein: 8.3 g/dL — ABNORMAL HIGH (ref 6.5–8.1)
Total Protein: 7 g/dL (ref 6.5–8.1)
Total Protein: 6.7 g/dL (ref 6.5–8.1)

## 2022-05-14 LAB — POCT I-STAT 7, (LYTES, BLD GAS, ICA,H+H)
Acid-base deficit: 1 mmol/L (ref 0.0–2.0)
Acid-base deficit: 3 mmol/L — ABNORMAL HIGH (ref 0.0–2.0)
Bicarbonate: 22 mmol/L (ref 20.0–28.0)
Bicarbonate: 19.7 mmol/L — ABNORMAL LOW (ref 20.0–28.0)
Calcium, Ion: 0.99 mmol/L — ABNORMAL LOW (ref 1.15–1.40)
Calcium, Ion: 1.09 mmol/L — ABNORMAL LOW (ref 1.15–1.40)
HCT: 20 % — ABNORMAL LOW (ref 39.0–52.0)
HCT: 21 % — ABNORMAL LOW (ref 39.0–52.0)
Hemoglobin: 6.8 g/dL — CL (ref 13.0–17.0)
Hemoglobin: 7.1 g/dL — ABNORMAL LOW (ref 13.0–17.0)
O2 Saturation: 99 %
O2 Saturation: 100 %
Potassium: 4.4 mmol/L (ref 3.5–5.1)
Potassium: 4.8 mmol/L (ref 3.5–5.1)
Sodium: 136 mmol/L (ref 135–145)
Sodium: 143 mmol/L (ref 135–145)
TCO2: 23 mmol/L (ref 22–32)
TCO2: 21 mmol/L — ABNORMAL LOW (ref 22–32)
pCO2 arterial: 26.8 mmHg — ABNORMAL LOW (ref 32–48)
pCO2 arterial: 27.2 mmHg — ABNORMAL LOW (ref 32–48)
pH, Arterial: 7.522 — ABNORMAL HIGH (ref 7.35–7.45)
pH, Arterial: 7.468 — ABNORMAL HIGH (ref 7.35–7.45)
pO2, Arterial: 104 mmHg (ref 83–108)
pO2, Arterial: 252 mmHg — ABNORMAL HIGH (ref 83–108)

## 2022-05-14 LAB — MAGNESIUM: Magnesium: 2.4 mg/dL (ref 1.7–2.4)

## 2022-05-14 LAB — TROPONIN I (HIGH SENSITIVITY): Troponin I (High Sensitivity): 5548 ng/L (ref <18)

## 2022-05-14 LAB — POCT I-STAT, CHEM 8
BUN: 63 mg/dL — ABNORMAL HIGH (ref 6–20)
Calcium, Ion: 1.09 mmol/L — ABNORMAL LOW (ref 1.15–1.40)
Chloride: 101 mmol/L (ref 98–111)
Creatinine, Ser: 9.7 mg/dL — ABNORMAL HIGH (ref 0.61–1.24)
Glucose, Bld: 306 mg/dL — ABNORMAL HIGH (ref 70–99)
HCT: 19 % — ABNORMAL LOW (ref 39.0–52.0)
Hemoglobin: 6.5 g/dL — CL (ref 13.0–17.0)
Potassium: 4.8 mmol/L (ref 3.5–5.1)
Sodium: 144 mmol/L (ref 135–145)
TCO2: 21 mmol/L — ABNORMAL LOW (ref 22–32)

## 2022-05-14 LAB — HEMOGLOBIN A1C
Hgb A1c MFr Bld: 5.2 % (ref 4.8–5.6)
Mean Plasma Glucose: 103 mg/dL

## 2022-05-14 LAB — GLUCOSE, CAPILLARY
Glucose-Capillary: 154 mg/dL — ABNORMAL HIGH (ref 70–99)
Glucose-Capillary: 158 mg/dL — ABNORMAL HIGH (ref 70–99)
Glucose-Capillary: 269 mg/dL — ABNORMAL HIGH (ref 70–99)

## 2022-05-14 LAB — BRAIN NATRIURETIC PEPTIDE: B Natriuretic Peptide: 1301.3 pg/mL — ABNORMAL HIGH (ref 0.0–100.0)

## 2022-05-14 LAB — LACTIC ACID, PLASMA
Lactic Acid, Venous: 1.7 mmol/L (ref 0.5–1.9)
Lactic Acid, Venous: 7.7 mmol/L (ref 0.5–1.9)

## 2022-05-14 MED ORDER — VASOPRESSIN 20 UNITS/100 ML INFUSION FOR SHOCK
INTRAVENOUS | Status: AC
  Administered 2022-05-14: 0.03 [IU]/min via INTRAVENOUS
  Filled 2022-05-14: qty 100

## 2022-05-14 MED ORDER — CALCIUM CHLORIDE 10 % IV SOLN
1.0000 g | Freq: Once | INTRAVENOUS | Status: AC
Start: 2022-05-14 — End: 2022-05-14

## 2022-05-14 MED ORDER — SODIUM CHLORIDE 0.9 % IV BOLUS
1000.0000 mL | Freq: Once | INTRAVENOUS | Status: AC
  Administered 2022-05-14: 1000 mL via INTRAVENOUS

## 2022-05-14 MED ORDER — VASOPRESSIN 20 UNITS/100 ML INFUSION FOR SHOCK: 0.0000 [IU]/min | INTRAVENOUS | Status: DC

## 2022-05-14 MED ORDER — HEPARIN (PORCINE) 25000 UT/250ML-% IV SOLN: 1200.0000 [IU]/h | INTRAVENOUS | Status: DC

## 2022-05-14 MED ORDER — LORAZEPAM 2 MG/ML IJ SOLN: 2.0000 mg | Freq: Once | INTRAMUSCULAR | Status: AC

## 2022-05-14 MED ORDER — LORAZEPAM 2 MG/ML IJ SOLN
INTRAMUSCULAR | Status: AC
  Administered 2022-05-14: 2 mg via INTRAVENOUS
  Filled 2022-05-14: qty 1

## 2022-05-14 MED ORDER — CALCIUM CHLORIDE 10 % IV SOLN
INTRAVENOUS | Status: AC
  Administered 2022-05-14: 1 g via INTRAVENOUS
  Filled 2022-05-14: qty 10

## 2022-05-14 MED ORDER — MAGNESIUM SULFATE 2 GM/50ML IV SOLN: 2.0000 g | Freq: Once | INTRAVENOUS | Status: AC

## 2022-05-19 ENCOUNTER — Other Ambulatory Visit (HOSPITAL_COMMUNITY): Payer: Self-pay

## 2022-05-28 ENCOUNTER — Ambulatory Visit: Payer: 59 | Admitting: Internal Medicine

## 2022-05-28 ENCOUNTER — Ambulatory Visit: Payer: Medicare Other

## 2022-05-29 ENCOUNTER — Ambulatory Visit: Payer: Medicare Other

## 2022-05-29 ENCOUNTER — Ambulatory Visit: Payer: 59 | Admitting: Internal Medicine

## 2022-06-05 ENCOUNTER — Ambulatory Visit: Payer: Medicare Other | Admitting: Neurology

## 2022-06-08 NOTE — Procedures (Signed)
Patient Name: Isaiah [name] [facility]  MRN: [id number]  Epilepsy Attending: Lora Havens  Referring Physician/Provider: Jacky Kindle, MD  Duration: 05/13/2022 1831 to 05/19/22 0946  Patient history: 56 year old male with end-stage renal disease on hemodialysis, prior left MCA stroke with residual right-sided weakness and seizure disorder who presented initially with chest pain, he was being worked up, this morning patient was working with physical therapy at that time he had tonic-clonic seizure followed by PEA cardiac arrest.  EEG to evaluate for seizure.  Level of alertness:  lethargic   AEDs during EEG study: LTG, LCM  Technical aspects: This EEG study was done with scalp electrodes positioned according to the 10-20 International system of electrode placement. Electrical activity was reviewed with band pass filter of 1-70Hz , sensitivity of 7 uV/mm, display speed of 17m/sec with a 60Hz  notched filter applied as appropriate. EEG data were recorded continuously and digitally stored.  Video monitoring was available and reviewed as appropriate.  Description: No clear posterior dominant rhythm was seen.  EEG showed continuous generalized and maximal right temporal region predominantly 5 to 7 Hz theta slowing admixed with intermittent generalized 2 to 3 Hz delta slowing. Hyperventilation and photic stimulation were not performed.     At 0742 on 9September 11, 2023eeg suddenly showed generalized attenuation. Patient was noted to be laying in bed with staff at bedside, not responding. CPR was performed but patient eventually passed away.  ABNORMALITY - Continuous slow, generalized and maximal right temporal region  IMPRESSION: This study is suggestive of cortical dysfunction in right temporal region likely secondary to underlying structural abnormality.  Additionally there is moderate to severe diffuse encephalopathy, nonspecific to etiology.  No seizures or epileptiform discharges were seen throughout the  recording.  At 0742 on 909/11/23eeg suddenly showed generalized attenuation. Patient was noted to be laying in bed with staff at bedside. CPR was performed but patient eventually passed away.  Jeffie Spivack OBarbra Sarks

## 2022-06-08 NOTE — Consult Note (Signed)
CARDIOLOGY CONSULT NOTE       Patient ID: Creola MRN: [id number] DOB/AGE: 1966/02/04 56 y.o.  Admit date: 05/17/2022 Referring Physician: Carlis Abbott Primary Physician: [name], MD Primary Cardiologist: Burt Knack Reason for Consultation: Cardiac Arrest ? Acute MI  Principal Problem:   Cardiac arrest [facility]) Active Problems:   ESRD (end stage renal disease) on dialysis   Iron deficiency anemia   Essential hypertension   Paroxysmal atrial fibrillation (HCC)   COPD (chronic obstructive pulmonary disease)   CVA (cerebral vascular accident)   Gastroesophageal reflux disease   Shortness of breath   Abnormality of gait   Hypothyroidism   Vascular dementia with behavioral disturbance ([place])   Diabetes mellitus with end-stage renal disease ([place])   Atypical chest pain   Vomiting   HPI:  56 y.o. currently being coded with chest compressions Admitted with cardiac arrest PEA x 2 with Vt as well Intubated Chronic dialysis patient with history of sickle cell trait PE pulmonary HTN ? Due to sickling, high flow fistula and history of PE. History of IVC filter and severe anemia not on anticoagulation  Has also had left MCA CVA with right sided weakness , multiple myeloma and seizures with one yesterday leading into PEA arrest with 6 minutes to ROSC. No history of CAD. Arrested again this am and currently being coded ECG with RBBB and ? Inferior posterior ST elevation Bedside TTE by critical care RV dysfunction Despite renal failure troponins have been negative Hb 10.1 K 3.9    ROS All other systems reviewed and negative except as noted above  Past Medical History:  Diagnosis Date   Abnormality of gait 49/20/1007   Acute metabolic encephalopathy 08/27/7587   Allergy 06/27/2019   AMS (altered mental status) 10/05/2019   Anaphylactic shock, unspecified 07/30/2021   Aphasia due to acute cerebrovascular accident 02/28/2016   Aspiration pneumonia 06/09/2019   Bacteremia    Bilateral  kidney stones 05/31/2019   Chronic intracranial subdural hematoma 06/09/2019   COPD (chronic obstructive pulmonary disease) 11/11/2016   CVA (cerebral vascular accident)    2017 and 2021; R-MCA, L-MCA, PCA and bilateral cerebellar complicated by DVT/PE   Delusional thoughts, secondary to stroke 2021   Diabetic retinopathy    PDR OU   Dysphagia, oropharyngeal phase 07/05/2019   ESRD (end stage renal disease) on dialysis    Essential hypertension 06/09/2019   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   GERD (gastroesophageal reflux disease) 11/11/2016   GIB (gastrointestinal bleeding)    Recurrent episodes- 09/2014, 09/2015 and 05/2016   Gout    Hammer toe, acquired 03/03/2016   Headache 09/28/2019   Hemiparesis affecting right side as late effect of cerebrovascular accident 02/28/2016   History of DVT (deep vein thrombosis) 01/08/2016   History of echocardiogram 08/2021   mod septal LVH o/w mild conc LVH, EF 55-60, no RWMA, Gr 2 DD, normal RVSF, severe LAE, trivial MR   History of ischemic left MCA stroke 01/08/2016   History of recent blood transfusion 10/27/2014   2 Units PRBC's   Hyperkalemia 07/2011   Hyperparathyroidism, secondary renal 11/11/2016   Hypocalcemia 12/26/2015   Hypotension 11/11/2016   Hypothyroidism 07/15/2019   Iron deficiency anemia, unspecified 01/22/2017   Ischemic colitis    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   Labile blood glucose    Labile blood pressure    Leukocytosis 01/08/2016   Multiple myeloma 05/17/2018   Onychomycosis due to dermatophyte 03/03/2016   Other specified coagulation defects 11/02/2015   PAF (paroxysmal atrial  fibrillation)    Partial idiopathic epilepsy with seizures of localized onset, intractable, without status epilepticus 11/02/2017   Patent foramen ovale 11/11/2016   Proteus infection 05/28/2019   Pruritus, unspecified 11/02/2015   Pulmonary hypertension 11/11/2016    By echo 09/2014  RHC on 04/21/19 for further  investigation of the nature of his elevated RVSP estimates on non-invasive imaging. His RHC (performed on a Thursday following HD on a Wednesday) demonstrated:  RA 10, RV 67/9, mPA 38, PCWP 19, CO 9.6, CI 4.5, PVR 2.0.   These findings are consistent with mild elevation in intracardiac filling pressures in the setting of high cardiac output in the setti   Right sided weakness 01/08/2016   Sepsis    Shortness of breath 11/02/2015   Sickle cell-thalassemia disease    a. Sickle cell trait.   Spastic hemiparesis 07/12/2019   Type 2 diabetes mellitus with hyperglycemia, without long-term current use of insulin    Ulcerative colitis 03/15/2020   Vascular dementia with behavioral disturbance ([place]) 11/05/2021   VTE (venous thromboembolism) 11/11/2016   DVT + PE    Family History  Problem Relation Age of Onset   Hypertension Mother    Diabetes Mother    Hypertension Father    Stroke Father    Sickle cell anemia Brother    Sickle cell anemia Brother    Diabetes type I Brother    Kidney disease Brother    Multiple myeloma Maternal Aunt    Mental illness Maternal Aunt        visual hallucinations of unknown origin    Social History   Socioeconomic History   Marital status: Married    Spouse name: [name]   Number of children: 1   Years of education: 18   Highest education level: Master's degree (e.g., MA, MS, MEng, MEd, MSW, MBA)  Occupational History   Occupation: Disability    Comment: prior ADCytogeneticist of compliance  Tobacco Use   Smoking status: Never   Smokeless tobacco: Never  Vaping Use   Vaping Use: Never used  Substance and Sexual Activity   Alcohol use: Not Currently   Drug use: Never   Sexual activity: Yes    Partners: Female  Other Topics Concern   Not on file  Social History Narrative   ** Merged History Encounter **       Pt lives in 2 story home with his wife and 1 son   Has masters degree in psychology   Currently disabled.   Right handed       Social  Determinants of Health   Financial Resource Strain: Low Risk  (05/01/2022)   Overall Financial Resource Strain (CARDIA)    Difficulty of Paying Living Expenses: Not hard at all  Food Insecurity: No Food Insecurity (05/13/2022)   Hunger Vital Sign    Worried About Running Out of Food in the Last Year: Never true    Ran Out of Food in the Last Year: Never true  Transportation Needs: No Transportation Needs (05/13/2022)   PRAPARE - Hydrologist (Medical): No    Lack of Transportation (Non-Medical): No  Physical Activity: Insufficiently Active (05/01/2022)   Exercise Vital Sign    Days of Exercise per Week: 2 days    Minutes of Exercise per Session: 60 min  Stress: No Stress Concern Present (05/01/2022)   [place]    Feeling of Stress : Not at all  Social  Connections: Not on file  Intimate Partner Violence: Not At Risk (05/13/2022)   Humiliation, Afraid, Rape, and Kick questionnaire    Fear of Current or Ex-Partner: No    Emotionally Abused: No    Physically Abused: No    Sexually Abused: No    Past Surgical History:  Procedure Laterality Date   AV FISTULA PLACEMENT Right 12/05/2015   Procedure: INSERTION OF ARTERIOVENOUS (AV) GORE-TEX GRAFT ARM;  Surgeon: Elam Dutch, MD;  Location: [facility];  Service: Vascular;  Laterality: Right;   BONE MARROW BIOPSY     CATARACT EXTRACTION Right 09/2019   Dr. Herbert Deaner   CATARACT EXTRACTION Left 10/2019   Dr. Herbert Deaner   CHOLECYSTECTOMY     CHOLECYSTECTOMY  [phone]?   COLONOSCOPY WITH PROPOFOL N/A 01/22/2017   Procedure: COLONOSCOPY WITH PROPOFOL;  Surgeon: Wilford Corner, MD;  Location: [facility];  Service: Endoscopy;  Laterality: N/A;   EYE SURGERY Bilateral    Cat Sx - Dr. Herbert Deaner   INSERTION OF DIALYSIS CATHETER Right 10/16/2015   Procedure: INSERTION OF PALINDROME DIALYSIS CATHETER ;  Surgeon: Elam Dutch, MD;  Location: [facility];  Service: Vascular;   Laterality: Right;   OTHER SURGICAL HISTORY     Retinal surgery   PARS PLANA VITRECTOMY  02/17/2012   Procedure: PARS PLANA VITRECTOMY WITH 25 GAUGE;  Surgeon: Hayden Pedro, MD;  Location: [facility];  Service: Ophthalmology;  Laterality: Right;   PERIPHERAL VASCULAR CATHETERIZATION N/A 03/20/2016   Procedure: A/V Shuntogram/Fistulagram;  Surgeon: Conrad San Isidro, MD;  Location: [facility];  Service: Cardiovascular;  Laterality: N/A;   TEE WITHOUT CARDIOVERSION N/A 12/02/2016   Procedure: TRANSESOPHAGEAL ECHOCARDIOGRAM (TEE);  Surgeon: Larey Dresser, MD;  Location: [facility];  Service: Cardiovascular;  Laterality: N/A;      Current Facility-Administered Medications:    acetaminophen (TYLENOL) tablet 650 mg, 650 mg, Oral, Q6H PRN **OR** acetaminophen (TYLENOL) suppository 650 mg, 650 mg, Rectal, Q6H PRN, Cyndia Skeeters, Taye T, MD   atorvastatin (LIPITOR) tablet 20 mg, 20 mg, Per Tube, Daily, Einar Grad, RPH   calcium chloride 10 % injection, , , ,    calcium chloride injection 1 g, 1 g, Intravenous, Once, Ollis, Brandi L, NP   Chlorhexidine Gluconate Cloth 2 % PADS 6 each, 6 each, Topical, Q0600, Cyndia Skeeters, Taye T, MD, 6 each at Jun 12, 2022 0557   Chlorhexidine Gluconate Cloth 2 % PADS 6 each, 6 each, Topical, Q0600, Roney Jaffe, MD, 6 each at 2022-06-12 0556   cholecalciferol (VITAMIN D3) 25 MCG (1000 UNIT) tablet 1,000 Units, 1,000 Units, Per Tube, Q M,W,F-HD, Arrie Senate T, RPH   donepezil (ARICEPT) tablet 5 mg, 5 mg, Per Tube, Daily, Einar Grad, RPH   EPINEPHrine (ADRENALIN) 5 mg in NS 250 mL (0.02 mg/mL) premix infusion, 0.5-20 mcg/min, Intravenous, Titrated, Carmin Muskrat, MD, Stopped at 05/13/22 1259   escitalopram (LEXAPRO) tablet 10 mg, 10 mg, Per Tube, Daily, Einar Grad, RPH   fentaNYL (SUBLIMAZE) injection 25 mcg, 25 mcg, Intravenous, Q2H PRN, Gonfa, Taye T, MD, 25 mcg at 05/13/22 0842   fluticasone furoate-vilanterol (BREO ELLIPTA) 200-25 MCG/ACT 1 puff, 1  puff, Inhalation, Daily, Gonfa, Taye T, MD   heparin injection 1,000-6,000 Units, 1,000-6,000 Units, CRRT, PRN, Minor, Grace Bushy, NP, 2,400 Units at 05/13/22 1233   heparin injection 5,000 Units, 5,000 Units, Subcutaneous, Q8H, Gonfa, Taye T, MD, 5,000 Units at June 12, 2022 0556   hydrALAZINE (APRESOLINE) injection 5 mg, 5 mg, Intravenous, Q6H PRN, Shela Leff, MD, 5  mg at 05/13/22 0437   hydrOXYzine (ATARAX) tablet 25 mg, 25 mg, Oral, PRN, Gonfa, Taye T, MD   insulin aspart (novoLOG) injection 0-6 Units, 0-6 Units, Subcutaneous, Q4H, Chand, Currie Paris, MD, 1 Units at May 25, 2022 0449   ipratropium-albuterol (DUONEB) 0.5-2.5 (3) MG/3ML nebulizer solution 3 mL, 3 mL, Nebulization, Q4H PRN, Cyndia Skeeters, Taye T, MD   labetalol (NORMODYNE) injection 10 mg, 10 mg, Intravenous, Q2H PRN, Cyndia Skeeters, Taye T, MD, 10 mg at 05/13/22 0543   lacosamide (VIMPAT) tablet 200 mg, 200 mg, Per Tube, Irregular times every day, Einar Grad, RPH, 200 mg at 05/13/22 2040   lamoTRIgine (LAMICTAL) tablet 200 mg, 200 mg, Per Tube, BID, Einar Grad, RPH, 200 mg at 05/13/22 2153   loratadine (CLARITIN) tablet 10 mg, 10 mg, Per Tube, Daily, Einar Grad, RPH   LORazepam (ATIVAN) 2 MG/ML injection, , , ,    LORazepam (ATIVAN) injection 2 mg, 2 mg, Intravenous, Once, Ollis, Brandi L, NP   magnesium sulfate IVPB 2 g 50 mL, 2 g, Intravenous, Once, Carlis Abbott, Laura P, DO   multivitamin (RENA-VIT) tablet 1 tablet, 1 tablet, Per Tube, QHS, Einar Grad, RPH, 1 tablet at 05/13/22 2153   norepinephrine (LEVOPHED) 42m in 2541m(0.016 mg/mL) premix infusion, 0-40 mcg/min, Intravenous, Titrated, LoCarmin MuskratMD, Last Rate: 18.75 mL/hr at 0909/17/23743, 5 mcg/min at [id number]   ondansetron (ZOFRAN) tablet 4 mg, 4 mg, Oral, Q6H PRN **OR** ondansetron (ZOFRAN) injection 4 mg, 4 mg, Intravenous, Q6H PRN, GoCyndia SkeetersTaye T, MD, 4 mg at 05/13/22 0212   Oral care mouth rinse, 15 mL, Mouth Rinse, Q2H, Chand, Sudham, MD, 15 mL at  [id number]   Oral care mouth rinse, 15 mL, Mouth Rinse, PRN, ChJacky KindleMD   pantoprazole (PROTONIX) 2 mg/mL oral suspension 40 mg, 40 mg, Per Tube, Daily, BiEinar GradRPH, 40 mg at 05/13/22 1650   polyethylene glycol (MIRALAX / GLYCOLAX) packet 17 g, 17 g, Oral, Daily PRN, Gonfa, Taye T, MD   sodium chloride 0.9 % bolus 1,000 mL, 1,000 mL, Intravenous, Once, Ollis, Brandi L, NP   sodium chloride flush (NS) 0.9 % injection 10-40 mL, 10-40 mL, Intracatheter, Q12H, Chand, Sudham, MD, 10 mL at 05/13/22 2154   sodium chloride flush (NS) 0.9 % injection 10-40 mL, 10-40 mL, Intracatheter, PRN, ChJacky KindleMD   vasopressin (PITRESSIN) 20 Units in sodium chloride 0.9 % 100 mL infusion-*FOR SHOCK*, 0-0.03 Units/min, Intravenous, Continuous, Ollis, Brandi L, NP, Last Rate: 9 mL/hr at [id number], 0.03 Units/min at [id number]  atorvastatin  20 mg Per Tube Daily   calcium chloride       calcium chloride  1 g Intravenous Once   Chlorhexidine Gluconate Cloth  6 each Topical Q0600   Chlorhexidine Gluconate Cloth  6 each Topical Q0600   cholecalciferol  1,000 Units Per Tube Q M,W,F-HD   donepezil  5 mg Per Tube Daily   escitalopram  10 mg Per Tube Daily   fluticasone furoate-vilanterol  1 puff Inhalation Daily   heparin  5,000 Units Subcutaneous Q8H   insulin aspart  0-6 Units Subcutaneous Q4H   lacosamide  200 mg Per Tube Irregular times every day   lamoTRIgine  200 mg Per Tube BID   loratadine  10 mg Per Tube Daily   LORazepam       LORazepam  2 mg Intravenous Once   multivitamin  1 tablet Per Tube QHS   mouth rinse  15 mL Mouth Rinse Q2H  pantoprazole  40 mg Per Tube Daily   sodium chloride flush  10-40 mL Intracatheter Q12H    epinephrine Stopped (05/13/22 1259)   magnesium sulfate bolus IVPB     norepinephrine (LEVOPHED) Adult infusion 5 mcg/min (May 22, 2022 0743)   sodium chloride     vasopressin 0.03 Units/min (05/22/22 0759)    Physical Exam: Blood pressure 103/63,  pulse (!) 101, temperature 99 F (37.2 C), temperature source Oral, resp. rate (!) 28, height 5' 9"  (1.753 m), weight 78.3 kg, SpO2 100 %.   Black male being coded with chest compressions Intubated Vascular access for dialysis  Previous right hemiparesis   Labs:   Lab Results  Component Value Date   WBC 8.4 05/13/2022   HGB 9.9 (L) 05/13/2022   HCT RESULTS UNAVAILABLE DUE TO INTERFERING SUBSTANCE 05/13/2022   MCV RESULTS UNAVAILABLE DUE TO INTERFERING SUBSTANCE 05/13/2022   PLT 155 05/13/2022    Recent Labs  Lab 05/13/22 1918  NA 140  K 3.8  CL 93*  CO2 26  BUN 63*  CREATININE 9.72*  CALCIUM 8.6*  PROT 7.0  BILITOT 2.1*  ALKPHOS 416*  ALT 154*  AST 228*  GLUCOSE 212*   Lab Results  Component Value Date   CKTOTAL 487 (H) 07/19/2011   CKMB 18.0 (HH) 07/19/2011   TROPONINI 8.38 (HH) 07/19/2011    Lab Results  Component Value Date   CHOL 146 01/07/2022   CHOL 135 02/09/2021   CHOL 169 03/17/2019   Lab Results  Component Value Date   HDL 63 01/07/2022   HDL 51 02/09/2021   HDL 62 03/17/2019   Lab Results  Component Value Date   LDLCALC 68 01/07/2022   LDLCALC 68 02/09/2021   LDLCALC 89 03/17/2019   Lab Results  Component Value Date   TRIG 79 01/07/2022   TRIG 78 02/09/2021   TRIG 91 03/17/2019   Lab Results  Component Value Date   CHOLHDL 2.3 01/07/2022   CHOLHDL 2.7 03/17/2019   CHOLHDL 2.7 10/24/2015   No results found for: "LDLDIRECT"    Radiology: CT HEAD WO CONTRAST (5MM)  Result Date: 05/13/2022 CLINICAL DATA:  Neuro deficit, acute, stroke suspected EXAM: CT HEAD WITHOUT CONTRAST TECHNIQUE: Contiguous axial images were obtained from the base of the skull through the vertex without intravenous contrast. RADIATION DOSE REDUCTION: This exam was performed according to the departmental dose-optimization program which includes automated exposure control, adjustment of the mA and/or kV according to patient size and/or use of iterative  reconstruction technique. COMPARISON:  CT head 10/04/2019. FINDINGS: Brain: Remote right occipital and bilateral corona radiata infarcts. Additional remote left cerebellar infarct. Additional patchy white matter hypoattenuation, nonspecific but compatible with chronic microvascular ischemic disease. No evidence of acute large vascular territory infarct. Approximately 3-4 mm intermediate density right subdural hematoma. No significant mass effect. No mass lesion, midline shift, or hydrocephalus. Vascular: Calcific intracranial atherosclerosis. Skull: No acute fracture. Sinuses/Orbits: Left maxillary sinus retention cyst. No acute orbital findings. Other: No mastoid effusions. IMPRESSION: 1. Approximately 3-4 mm intermediate density right subdural hematoma which is similar to prior MRI and decreased relative to 2021 CT head. No significant mass effect. 2. Remote infarcts and chronic microvascular ischemic disease. While there is no definite evidence of acute large vascular territory infarct an MRI could provide more sensitive evaluation if clinically warranted. Electronically Signed   By: Margaretha Sheffield M.D.   On: 05/13/2022 15:12   DG Abd Portable 1V  Result Date: 05/13/2022 CLINICAL DATA:  Central line and  orogastric tube placement EXAM: PORTABLE ABDOMEN - 1 VIEW COMPARISON:  10/15/2015 FINDINGS: Gastric tube is in the decompressed stomach. Retrievable IVC filter at the L2-3 level. Cholecystectomy clips. Normal bowel gas pattern. Lower abdomen is partially excluded. Monitoring leads overlie the patient. IMPRESSION: Gastric tube to the decompressed stomach.  Normal bowel gas pattern. Electronically Signed   By: Lucrezia Europe M.D.   On: 05/13/2022 14:22   DG CHEST PORT 1 VIEW  Result Date: 05/13/2022 CLINICAL DATA:  Encounter for central line placement and OG tube placement EXAM: PORTABLE CHEST - 1 VIEW COMPARISON:  05/13/2022 FINDINGS: Endotracheal tube and gastric tube stable in position. Interval right IJ  hemodialysis catheter placement to the distal SVC. No pneumothorax. Lungs remain clear. Heart size and mediastinal contours are within normal limits. No effusion. Visualized bones unremarkable. IMPRESSION: Central line to distal SVC.  No pneumothorax. Electronically Signed   By: Lucrezia Europe M.D.   On: 05/13/2022 14:21   DG Chest Portable 1 View  Result Date: 05/13/2022 CLINICAL DATA:  Post-injury patient. Endotracheal tube. Gastric tube. Post CPR. EXAM: PORTABLE CHEST 1 VIEW COMPARISON:  Chest two views 05/24/2022, AP chest 10/04/2019; CT chest 05/26/2022 FINDINGS: Endotracheal tube tip terminates approximately 4.6 cm above the carina, new from prior. New enteric tube descends below the diaphragm with the side port just past the gastroesophageal junction with the tip overlying the left upper abdominal quadrant. Cardiac silhouette is again mildly enlarged. Mediastinal contours are within normal limits. The lungs are clear. No pleural effusion or pneumothorax. Moderate multilevel degenerative disc changes of the thoracic spine. Cholecystectomy clips. IMPRESSION: 1. New endotracheal and enteric tubes in appropriate position. 2. No acute lung process. Electronically Signed   By: Yvonne Kendall M.D.   On: 05/13/2022 10:58   CT Angio Chest PE W and/or Wo Contrast  Result Date: 05/10/2022 CLINICAL DATA:  Pleuritic chest pain and shortness of breath. Elevated D-dimer. High probability for pulmonary embolism. EXAM: CT ANGIOGRAPHY CHEST WITH CONTRAST TECHNIQUE: Multidetector CT imaging of the chest was performed using the standard protocol during bolus administration of intravenous contrast. Multiplanar CT image reconstructions and MIPs were obtained to evaluate the vascular anatomy. RADIATION DOSE REDUCTION: This exam was performed according to the departmental dose-optimization program which includes automated exposure control, adjustment of the mA and/or kV according to patient size and/or use of iterative  reconstruction technique. CONTRAST:  57m OMNIPAQUE IOHEXOL 350 MG/ML SOLN COMPARISON:  None Available. FINDINGS: Cardiovascular: Satisfactory opacification of pulmonary arteries noted, and no pulmonary emboli identified. No evidence of thoracic aortic dissection or aneurysm. Mild cardiomegaly. Aortic and coronary atherosclerotic calcification incidentally noted. Mediastinum/Nodes: No masses or pathologically enlarged lymph nodes identified. Lungs/Pleura: No pulmonary mass, infiltrate, or effusion. Upper abdomen: Small hiatal hernia noted. Prior cholecystectomy. 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 Musculoskeletal: No suspicious bone lesions identified. Review of the MIP images confirms the above findings. IMPRESSION: No evidence of pulmonary embolism or other acute findings. Small hiatal hernia. Chronic auto-splenectomy, consistent with sickle cell disease. Aortic Atherosclerosis (ICD10-I70.0). Electronically Signed   By: JMarlaine HindM.D.   On: 05/25/2022 15:25   DG Chest 2 View  Result Date: 05/13/2022 CLINICAL DATA:  Chest pain EXAM: CHEST - 2 VIEW COMPARISON:  10/04/2019 FINDINGS: AP and lateral views of the chest show low lung volumes. The cardio pericardial silhouette is enlarged. Diffuse interstitial opacity likely has a chronic component but superimposed interstitial edema is not excluded. No pleural effusion. Visualized bony anatomy unremarkable. IMPRESSION:  Low volume film with enlarged cardiopericardial silhouette and probable interstitial edema. Electronically Signed   By: Misty Stanley M.D.   On: 06/04/2022 11:37    EKG: SR RBBB Inferior posterior ST elevation  RBBB and ST elevation new from 05/11/2022   ASSESSMENT AND PLAN:   Cardiac Arrest:  in setting of multiple co morbidities including CRF on dialysis , CVT with right hemiparesis , pulmonary HTN with sickle cell trait , seizures, multiple myeloma, PAF. PEA arrest x 2 with some VT and  currently being coded with chest compressions. ECG with ? Acute inferior posterior RV infarct Currently not a candidate for cath If he survives 2nd arrest can consider latter today but having prolonged CPR, and need to correct for lactate/K and update bedside TTE or can do TEE since intubated Will discuss with critical care if patient survives current situation   Signed: Jenkins Rouge 05/18/22, 8:52 AM

## 2022-06-08 NOTE — Code Documentation (Signed)
[name] developed hypotension this morning. Vasopressin, norepi added. Despite this he developed worsening hypotension. Epi gtt was added and epi pushes. He developed VT with dropping Bps and underwent DCCV x 2 and was loaded with amiodarone 16m. His HR dropped and he developed worsening hypotension. Bedside echo with dilated RV, WMA of LV. Chest compressions were initiated and ACLS was started. All drips were continued. Heparin started; TPA was not given due to SS and high risk for PH chronically plus he had a negative PE study 2 days ago. Multiple resuscitations before ROSC. Milrinone, heparin, vaso, epi, NE, amiodarone infusions titrated during code. Cardiology at bedside- EKG reviewed-- concerning for inferolateral ischemia, correlating with WMA on echo. Wife updated on her way to the hospital. When she arrived she was updated and I spoke to a family member who is a pEngineer, drilling The decision was made to change code status to DNR. He is continuing on all support, but his wife understands my concern that this may not be survivable.  This patient is critically ill with multiple organ system failure which requires frequent high complexity decision making, assessment, support, evaluation, and titration of therapies. This was completed through the application of advanced monitoring technologies and extensive interpretation of multiple databases. During this encounter critical care time was devoted to patient care services described in this note for 80 minutes.  LJulian Hy DO 02023/10/019:42 AM [facility]

## 2022-06-08 NOTE — Progress Notes (Signed)
OT Cancellation Note  Patient Details Name: Pollock MRN: [id number] DOB: 1965-11-05   Cancelled Treatment:    Reason Eval/Treat Not Completed: Patient not medically ready.  Continues to code.    [place] Thresher D Argentina Kosch  06-12-2022, 9:13 AM 06-12-22  RP, OTR/L  Acute Rehabilitation Services  Office:  [phone]

## 2022-06-08 NOTE — Death Summary Note (Signed)
DEATH SUMMARY   Isaiah Details  Name: Isaiah [name] [facility] MRN: [id number] DOB: December 15, 1965  Admission/Discharge Information   Admit Date:  25-May-2022  Date of Death: Date of Death: May 27, 2022  Time of Death: Time of Death: 0947  Length of Stay: 1  Referring Physician: [name], MD   Reason(s) for Hospitalization  Chest pain  Diagnoses  Preliminary cause of death: cardiogenic shock Secondary Diagnoses (including complications and co-morbidities):  Principal Problem:   Cardiac arrest [name]) Active Problems:   ESRD (end stage renal disease) on dialysis   Iron deficiency anemia   Essential hypertension   Paroxysmal atrial fibrillation (HCC)   COPD (chronic obstructive pulmonary disease)   CVA (cerebral vascular accident)   Gastroesophageal reflux disease   Shortness of breath   Abnormality of gait   Hypothyroidism   Vascular dementia with behavioral disturbance ([place])   Diabetes mellitus with end-stage renal disease ([place])   Atypical chest pain   Vomiting   Cardiac arrest, cause unspecified ([place])   ST elevation myocardial infarction (STEMI) ([place])   On mechanically assisted ventilation ([place]) Elevated transaminases, hyperbilirubinemia Lactic acidosis  Brief Hospital Course (including significant findings, care, treatment, and services provided and events leading to death)  Isaiah [name] is a 56 y.o. year old male  with a sickle cell disease, ESRD on HD, seizures, CVA with residual aphasia, DM, HTN, paroxysmal Afib, pulmonary hypertension who presented with chest pain and SOB. He initially had a negative CTA for PE and negative troponin levels. His chest pain was felt to be unlikely related to his heart given diffuse and atypical nature. He was managed with plans for iHD. His uncontrolled HTN was managed with home meds plus PRN IV medications. He developed a seizure the day following admission and developed a subsequent bradycardic arrest. He was intubated and received ACLS (2 rounds  CPR, 1 epi). He was initiated on pressors but developred recurrent bradycardic arrest. He received 1 additional round of CPR, was initiated on NE, epi, neosynephrine and remained hemodynamically stable. A-line and CVC were placed. He was able to be weaned off pressors within hours. He was monitored on cEEG. Overnight he remained off vasopressors and only on fentanyl for sedation. He did not require TTM due to waking up and tracking with his eyes last night. He acutely this morning developed rapidly progressive shock despite addition of vasopressors. He was given pushes of epinephrine to rapidly raise his BP while vasopressor infusions could be titrated. Bedside echo showed dilated RV, RWMA of LV with reduced EF but normal cavity size. Initially BP stabilized but dropped when he developed NS then sustained VT. He underwent cardioversion x 2 and received 126m amiodarone bolus. Subsequently he developed bradycardia and worsening hypotension when CPR was initiated. He was managed via ACLS. Cardiology had been consulted and EKG + troponin ordered as he began to decompensate. Eventually he was on vasopressin, epi, norepi, milrinone, amiodarone, heparin infusions. Cardiology felt that he was too unstable for LHC acutely. Unfortunately despite aggressive care measures he continued to decline. Family opted to not pursue additional rounds of resuscitation if he arrested again. Despite ongoing aggressive management, he went into PRA cardiac arrest and passed away with his wife at bedside.   Pertinent Labs and Studies  Significant Diagnostic Studies Overnight EEG with video  Result Date: 9Sep 19, 2023YLora Havens MD     9September 19, 202312:34 PM Isaiah Name[name]MR[facility]: [id number] Attending: PLora HavensReferring Physician/Provider: CJacky Kindle MD Duration: 05/13/2022 1831  to 05-31-2022 0946 Isaiah history: 56 year old male with end-stage renal disease on hemodialysis, prior left MCA stroke with residual  right-sided weakness and seizure disorder who presented initially with chest pain, he was being worked up, this morning Isaiah was working with physical therapy at that time he had tonic-clonic seizure followed by PEA cardiac arrest.  EEG to evaluate for seizure. Level of alertness:  lethargic AEDs during EEG study: LTG, LCM Technical aspects: This EEG study was done with scalp electrodes positioned according to the 10-20 International system of electrode placement. Electrical activity was reviewed with band pass filter of 1-70Hz , sensitivity of 7 uV/mm, display speed of 71m/sec with a 60Hz  notched filter applied as appropriate. EEG data were recorded continuously and digitally stored.  Video monitoring was available and reviewed as appropriate. Description: No clear posterior dominant rhythm was seen.  EEG showed continuous generalized and maximal right temporal region predominantly 5 to 7 Hz theta slowing admixed with intermittent generalized 2 to 3 Hz delta slowing. Hyperventilation and photic stimulation were not performed.   At 0742 on 923-Sep-2023eeg suddenly showed generalized attenuation. Isaiah was noted to be laying in bed with staff at bedside, not responding. CPR was performed but Isaiah eventually passed away. ABNORMALITY - Continuous slow, generalized and maximal right temporal region IMPRESSION: This study is suggestive of cortical dysfunction in right temporal region likely secondary to underlying structural abnormality.  Additionally there is moderate to severe diffuse encephalopathy, nonspecific to etiology.  No seizures or epileptiform discharges were seen throughout the recording. At 0742 on 9Sep 23, 2023eeg suddenly showed generalized attenuation. Isaiah was noted to be laying in bed with staff at bedside. CPR was performed but Isaiah eventually passed away. [place]  CT HEAD WO CONTRAST (5MM)  Result Date: 05/13/2022 CLINICAL DATA:  Neuro deficit, acute, stroke suspected EXAM: CT  HEAD WITHOUT CONTRAST TECHNIQUE: Contiguous axial images were obtained from the base of the skull through the vertex without intravenous contrast. RADIATION DOSE REDUCTION: This exam was performed according to the departmental dose-optimization program which includes automated exposure control, adjustment of the mA and/or kV according to Isaiah size and/or use of iterative reconstruction technique. COMPARISON:  CT head 10/04/2019. FINDINGS: Brain: Remote right occipital and bilateral corona radiata infarcts. Additional remote left cerebellar infarct. Additional patchy white matter hypoattenuation, nonspecific but compatible with chronic microvascular ischemic disease. No evidence of acute large vascular territory infarct. Approximately 3-4 mm intermediate density right subdural hematoma. No significant mass effect. No mass lesion, midline shift, or hydrocephalus. Vascular: Calcific intracranial atherosclerosis. Skull: No acute fracture. Sinuses/Orbits: Left maxillary sinus retention cyst. No acute orbital findings. Other: No mastoid effusions. IMPRESSION: 1. Approximately 3-4 mm intermediate density right subdural hematoma which is similar to prior MRI and decreased relative to 2021 CT head. No significant mass effect. 2. Remote infarcts and chronic microvascular ischemic disease. While there is no definite evidence of acute large vascular territory infarct an MRI could provide more sensitive evaluation if clinically warranted. Electronically Signed   By: FMargaretha SheffieldM.D.   On: 05/13/2022 15:12   DG Abd Portable 1V  Result Date: 05/13/2022 CLINICAL DATA:  Central line and orogastric tube placement EXAM: PORTABLE ABDOMEN - 1 VIEW COMPARISON:  10/15/2015 FINDINGS: Gastric tube is in the decompressed stomach. Retrievable IVC filter at the L2-3 level. Cholecystectomy clips. Normal bowel gas pattern. Lower abdomen is partially excluded. Monitoring leads overlie the Isaiah. IMPRESSION: Gastric tube to the  decompressed stomach.  Normal bowel gas pattern. Electronically Signed  By: Lucrezia Europe M.D.   On: 05/13/2022 14:22   DG CHEST PORT 1 VIEW  Result Date: 05/13/2022 CLINICAL DATA:  Encounter for central line placement and OG tube placement EXAM: PORTABLE CHEST - 1 VIEW COMPARISON:  05/13/2022 FINDINGS: Endotracheal tube and gastric tube stable in position. Interval right IJ hemodialysis catheter placement to the distal SVC. No pneumothorax. Lungs remain clear. Heart size and mediastinal contours are within normal limits. No effusion. Visualized bones unremarkable. IMPRESSION: Central line to distal SVC.  No pneumothorax. Electronically Signed   By: Lucrezia Europe M.D.   On: 05/13/2022 14:21   DG Chest Portable 1 View  Result Date: 05/13/2022 CLINICAL DATA:  Post-injury Isaiah. Endotracheal tube. Gastric tube. Post CPR. EXAM: PORTABLE CHEST 1 VIEW COMPARISON:  Chest two views 05/16/2022, AP chest 10/04/2019; CT chest 05/22/2022 FINDINGS: Endotracheal tube tip terminates approximately 4.6 cm above the carina, new from prior. New enteric tube descends below the diaphragm with the side port just past the gastroesophageal junction with the tip overlying the left upper abdominal quadrant. Cardiac silhouette is again mildly enlarged. Mediastinal contours are within normal limits. The lungs are clear. No pleural effusion or pneumothorax. Moderate multilevel degenerative disc changes of the thoracic spine. Cholecystectomy clips. IMPRESSION: 1. New endotracheal and enteric tubes in appropriate position. 2. No acute lung process. Electronically Signed   By: Yvonne Kendall M.D.   On: 05/13/2022 10:58   CT Angio Chest PE W and/or Wo Contrast  Result Date: 05/09/2022 CLINICAL DATA:  Pleuritic chest pain and shortness of breath. Elevated D-dimer. High probability for pulmonary embolism. EXAM: CT ANGIOGRAPHY CHEST WITH CONTRAST TECHNIQUE: Multidetector CT imaging of the chest was performed using the standard protocol during  bolus administration of intravenous contrast. Multiplanar CT image reconstructions and MIPs were obtained to evaluate the vascular anatomy. RADIATION DOSE REDUCTION: This exam was performed according to the departmental dose-optimization program which includes automated exposure control, adjustment of the mA and/or kV according to Isaiah size and/or use of iterative reconstruction technique. CONTRAST:  60m OMNIPAQUE IOHEXOL 350 MG/ML SOLN COMPARISON:  None Available. FINDINGS: Cardiovascular: Satisfactory opacification of pulmonary arteries noted, and no pulmonary emboli identified. No evidence of thoracic aortic dissection or aneurysm. Mild cardiomegaly. Aortic and coronary atherosclerotic calcification incidentally noted. Mediastinum/Nodes: No masses or pathologically enlarged lymph nodes identified. Lungs/Pleura: No pulmonary mass, infiltrate, or effusion. Upper abdomen: Small hiatal hernia noted. Prior cholecystectomy. 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 Musculoskeletal: No suspicious bone lesions identified. Review of the MIP images confirms the above findings. IMPRESSION: No evidence of pulmonary embolism or other acute findings. Small hiatal hernia. Chronic auto-splenectomy, consistent with sickle cell disease. Aortic Atherosclerosis (ICD10-I70.0). Electronically Signed   By: JMarlaine HindM.D.   On: 05/19/2022 15:25   DG Chest 2 View  Result Date: 05/27/2022 CLINICAL DATA:  Chest pain EXAM: CHEST - 2 VIEW COMPARISON:  10/04/2019 FINDINGS: AP and lateral views of the chest show low lung volumes. The cardio pericardial silhouette is enlarged. Diffuse interstitial opacity likely has a chronic component but superimposed interstitial edema is not excluded. No pleural effusion. Visualized bony anatomy unremarkable. IMPRESSION: Low volume film with enlarged cardiopericardial silhouette and probable interstitial edema. Electronically Signed   By: EMisty StanleyM.D.   On: 06/01/2022 11:37    Microbiology Recent Results (from the past 240 hour(s))  MRSA Next Gen by PCR, Nasal     Status: None   Collection Time: 05/13/22 12:51 PM   Specimen:  Nasal Mucosa; Nasal Swab  Result Value Ref Range Status   MRSA by PCR Next Gen NOT DETECTED NOT DETECTED Final    Comment: (NOTE) The GeneXpert MRSA Assay (FDA approved for NASAL specimens only), is one component of a comprehensive MRSA colonization surveillance program. It is not intended to diagnose MRSA infection nor to guide or monitor treatment for MRSA infections. Test performance is not FDA approved in patients less than 68 years old. Performed at [facility], [place] [address] [place], [place] [address]     Lab Basic Metabolic Panel: Recent Labs  Lab 05/11/2022 1109 05/13/22 0330 05/13/22 1038 05/13/22 1048 05/13/22 1136 05/13/22 1918 05/27/2022 0759 2022/05/27 0813 05-27-22 0843 May 27, 2022 0847  NA 142 139 142 156*   < > 140 141 136 144 143  K 4.5 4.2 6.1* 3.9   < > 3.8 4.4 4.4 4.8 4.8  CL 95* 94* 97* 96*  --  93* 95*  --  101  --   CO2 26 28 16*  --   --  26 20*  --   --   --   GLUCOSE 238* 144* 188* 181*  --  212* 271*  --  306*  --   BUN 73* 37* 47* 46*  --  63* 76*  --  63*  --   CREATININE 12.33* 7.88* 9.09* 6.10*  --  9.72* 10.87*  --  9.70*  --   CALCIUM 9.6 9.4 9.4  --   --  8.6* 9.0  --   --   --   MG  --  1.8  1.8  --   --   --   --  2.4  --   --   --   PHOS  --   --   --   --   --  4.0  --   --   --   --    < > = values in this interval not displayed.   Liver Function Tests: Recent Labs  Lab 05/13/22 0330 05/13/22 1038 05/13/22 1918 05/27/22 0759  AST 36 107* 228* 142*  ALT 36 85* 154* 128*  ALKPHOS 172* 344* 416* 531*  BILITOT 0.9 1.2 2.1* 1.9*  PROT 7.8 8.3* 7.0 6.7  ALBUMIN 4.4 4.5 3.7 3.4*   No results for input(s): "LIPASE", "AMYLASE" in the last 168 hours. No results for input(s): "AMMONIA" in the last 168 hours. CBC: Recent Labs  Lab  06/02/2022 1109 05/13/22 1048 05/13/22 1216 05/13/22 1918 05/27/22 0759 2022-05-27 0813 05-27-22 0843 2022/05/27 0847  WBC 12.4*  --  13.0* 8.4 7.3  --   --   --   NEUTROABS  --   --  11.4* 7.7  --   --   --   --   HGB 10.2*   < > 10.1* 9.9* 9.0* 6.8* 6.5* 7.1*  HCT 27.4*   < > RESULTS UNAVAILABLE DUE TO INTERFERING SUBSTANCE RESULTS UNAVAILABLE DUE TO INTERFERING SUBSTANCE RESULTS UNAVAILABLE DUE TO INTERFERING SUBSTANCE 20.0* 19.0* 21.0*  MCV 87.5  --  RESULTS UNAVAILABLE DUE TO INTERFERING SUBSTANCE RESULTS UNAVAILABLE DUE TO INTERFERING SUBSTANCE RESULTS UNAVAILABLE DUE TO INTERFERING SUBSTANCE  --   --   --   PLT 208  --  151 155 116*  --   --   --    < > = values in this interval not displayed.   Cardiac Enzymes: No results for input(s): "CKTOTAL", "CKMB", "CKMBINDEX", "TROPONINI" in the last 168 hours. Sepsis Labs: Recent  Labs  Lab 05/13/2022 1109 05/13/22 1216 05/13/22 1918 06/10/22 0002 06/10/22 0759  WBC 12.4* 13.0* 8.4  --  7.3  LATICACIDVEN  --   --  2.4* 1.7 7.7*    Procedures/Operations  Intubation HD catheter insertion A-line    Julian Hy 06-10-2022, 5:36 PM

## 2022-06-08 NOTE — Code Documentation (Addendum)
S: Called to bedside by RN for hypotension- RN notes BP was in 64'P systolic at shift change, now decreased to SBP in 60's.    O: Chart review shows patient admitted initially with chest pain, suffered seizure and cardiac arrest, subsequently admitted to ICU.  Hx of CVA with residual right-sided deficits, hypertension, recurrent episodes of GI bleeding, DVT, multiple myeloma, PAF, seizure disorder, sickle cell thalassemia disease with pulmonary hypertension, diabetes and ESRD on HD.  Labs from 9/5 reviewed-NA 140, K3.8, glucose 212, BUN 63, serum creatinine 9.72, CO2 26, ionized calcium 0.93, lactic acid 2.4 which cleared to 1.7, WBC 8.4, hemoglobin 9.9, platelets 155.  Additional work-up notable for negative CT angio of the chest for pulmonary embolism, no acute infiltrates, chronic autosplenectomy consistent with sickle cell disease.  CT head with 3 to 4 mm indeterminate density right subdural hematoma similar to prior MRI in 2021, remote infarcts and chronic micro vascular ischemic disease.  Prior echocardiogram in December 2022 with normal LVEF.   On arrival to bedside, patient examined with normal respiratory mechanics on ventilator, bilateral breath sounds on auscultation, continuous EEG in place, on fentanyl infusion.  Fentanyl infusion stopped.  Labs obtained and EKG which was concerning for possible inferior lateral ischemia.  Levophed initiated with rapid escalation to 20 mcg and blood pressure not responsive.  Vasopressin added.  Patient was given 1 g calcium chloride slow push.  He was noted to be diaphoretic.  CBG assessed with glucose greater than 200.  1 L normal saline bolus initiated.  Patient noted to have episode of increased peak pressures on ventilator with upward gaze and biting on endotracheal tube.  FiO2 increased to 100%.  2 mg IV Ativan given for possible seizure.  Despite vasopressor support he continued to be hypotensive and epinephrine was added.  He had intermittent episodes of  VT requiring amiodarone and DCCV x2.  Heparin initiated.  He intermittently required CPR and ACLS protocol.  Additional support of milrinone and amiodarone added during arrest.  Bedside echo with wall motion abnormalities noted. Cardiology consulted for evaluation.  Multiple rounds of ACLS completed.  Family discussion per Dr. Carlis Abbott with decision for DNR.  Current level of support maintained.    Critical Care Time: 60 minutes    Noe Gens, MSN, APRN, NP-C, AGACNP-BC [facility] 06/13/2022, 10:15 AM   Please see [email or web address] for pager details.   From 7A-7P if no response, please call [phone] After hours, please call ELink [phone]

## 2022-06-08 NOTE — Progress Notes (Signed)
PT Cancellation Note  Patient Details Name: Isaiah [name] MRN: [id number] DOB: 02/20/1966   Cancelled Treatment:    Reason Eval/Treat Not Completed: Medical issues which prohibited therapy (code blue). Will follow-up for PT Evaluation as appropriate.  Mabeline Caras, PT, DPT Acute Rehabilitation Services  Personal: [facility] Office: [place] May 26, 2022, 8:31 AM

## 2022-06-08 NOTE — Progress Notes (Signed)
Report received on patient at [place]. Upon assessment of the patient blood pressure appeared lower than reported normal for the shift. Patient began to drop blood pressure to the 14'G systolic and patient was restarted on levophed. Velna Hatchet, NP was called to the bedside for further assessment (see note). Order for vasopressin to be started. Order for 1 L fluid bolus. After both medications were at max infusion orders for epinephrine were received and started. Patient at that point was having EKG changes (see EKG that was performed). Dr Carlis Abbott (PCCM) at bedside to evaluate patient. Patient began to have frequent ectopy and then went into a non perfusing rhythm and a code was called at that time and ACLS was performed beginning with CPR. When family arrived at the bedside the patient was made a DNR. Drips were maintained at present doses but the patient continued to decompensate until his time of death at 37.

## 2022-06-08 DEATH — deceased

## 2022-06-24 ENCOUNTER — Encounter (INDEPENDENT_AMBULATORY_CARE_PROVIDER_SITE_OTHER): Payer: Medicare Other | Admitting: Ophthalmology

## 2022-06-24 ENCOUNTER — Other Ambulatory Visit: Payer: Self-pay

## 2022-07-08 ENCOUNTER — Other Ambulatory Visit: Payer: Medicare Other

## 2022-07-15 ENCOUNTER — Ambulatory Visit: Payer: Medicare Other | Admitting: Oncology

## 2022-07-16 MED FILL — Medication: Qty: 1 | Status: AC

## 2022-07-17 ENCOUNTER — Other Ambulatory Visit: Payer: Medicare Other

## 2022-07-24 ENCOUNTER — Ambulatory Visit: Payer: Medicare Other | Admitting: Oncology

## 2022-09-06 ENCOUNTER — Other Ambulatory Visit (HOSPITAL_COMMUNITY): Payer: Self-pay

## 2022-10-07 ENCOUNTER — Ambulatory Visit: Payer: Medicare Other | Admitting: Internal Medicine

## 2023-03-10 ENCOUNTER — Encounter: Payer: Self-pay | Admitting: Internal Medicine

## 2023-04-23 ENCOUNTER — Ambulatory Visit: Payer: Medicare Other | Admitting: Physical Medicine & Rehabilitation

## 2023-05-07 ENCOUNTER — Ambulatory Visit: Payer: 59 | Admitting: Internal Medicine
